# Patient Record
Sex: Female | Born: 1990 | Race: Black or African American | Hispanic: No | Marital: Single | Smoking: Current every day smoker
Health system: Southern US, Community
[De-identification: ages and names within clinical notes are randomized; demographics above are authoritative.]

## PROBLEM LIST (undated history)

## (undated) ENCOUNTER — Emergency Department (HOSPITAL_COMMUNITY): Admission: EM | Payer: 59 | Source: Home / Self Care

## (undated) ENCOUNTER — Ambulatory Visit (HOSPITAL_COMMUNITY): Admission: EM | Payer: Medicaid Other | Source: Home / Self Care

## (undated) ENCOUNTER — Inpatient Hospital Stay (HOSPITAL_COMMUNITY): Payer: Self-pay

## (undated) ENCOUNTER — Emergency Department (HOSPITAL_COMMUNITY): Discharge status: Absent without leave | Payer: Self-pay

## (undated) VITALS — BP 110/74 | HR 95 | Temp 98.1°F | Resp 17

## (undated) VITALS — BP 114/72 | HR 110 | Temp 98.5°F | Resp 20 | Ht 67.0 in | Wt 258.0 lb

## (undated) DIAGNOSIS — F99 Mental disorder, not otherwise specified: Secondary | ICD-10-CM

## (undated) DIAGNOSIS — R4189 Other symptoms and signs involving cognitive functions and awareness: Secondary | ICD-10-CM

## (undated) DIAGNOSIS — F419 Anxiety disorder, unspecified: Secondary | ICD-10-CM

## (undated) DIAGNOSIS — M1990 Unspecified osteoarthritis, unspecified site: Secondary | ICD-10-CM

## (undated) DIAGNOSIS — E119 Type 2 diabetes mellitus without complications: Secondary | ICD-10-CM

## (undated) DIAGNOSIS — E669 Obesity, unspecified: Secondary | ICD-10-CM

## (undated) DIAGNOSIS — F319 Bipolar disorder, unspecified: Secondary | ICD-10-CM

## (undated) DIAGNOSIS — I10 Essential (primary) hypertension: Secondary | ICD-10-CM

## (undated) DIAGNOSIS — F32A Depression, unspecified: Secondary | ICD-10-CM

## (undated) DIAGNOSIS — F329 Major depressive disorder, single episode, unspecified: Secondary | ICD-10-CM

## (undated) HISTORY — DX: Depression, unspecified: F32.A

## (undated) HISTORY — PX: TONSILLECTOMY: SUR1361

## (undated) HISTORY — DX: Major depressive disorder, single episode, unspecified: F32.9

---
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
## 2008-01-02 ENCOUNTER — Emergency Department (HOSPITAL_COMMUNITY): Admission: EM | Admit: 2008-01-02 | Discharge: 2008-01-02 | Payer: Self-pay | Admitting: Emergency Medicine

## 2008-03-30 ENCOUNTER — Emergency Department (HOSPITAL_COMMUNITY): Admission: EM | Admit: 2008-03-30 | Discharge: 2008-03-30 | Payer: Self-pay | Admitting: Emergency Medicine

## 2008-06-14 ENCOUNTER — Ambulatory Visit (HOSPITAL_COMMUNITY): Admission: RE | Admit: 2008-06-14 | Discharge: 2008-06-14 | Payer: Self-pay | Admitting: Obstetrics & Gynecology

## 2008-06-21 ENCOUNTER — Ambulatory Visit (HOSPITAL_COMMUNITY): Admission: RE | Admit: 2008-06-21 | Discharge: 2008-06-21 | Payer: Self-pay | Admitting: Family Medicine

## 2008-07-05 ENCOUNTER — Ambulatory Visit (HOSPITAL_COMMUNITY): Admission: RE | Admit: 2008-07-05 | Discharge: 2008-07-05 | Payer: Self-pay | Admitting: Family Medicine

## 2008-08-30 ENCOUNTER — Ambulatory Visit (HOSPITAL_COMMUNITY): Admission: RE | Admit: 2008-08-30 | Discharge: 2008-08-30 | Payer: Self-pay | Admitting: Family Medicine

## 2008-10-16 ENCOUNTER — Ambulatory Visit (HOSPITAL_COMMUNITY): Admission: RE | Admit: 2008-10-16 | Discharge: 2008-10-16 | Payer: Self-pay | Admitting: Obstetrics & Gynecology

## 2008-11-16 ENCOUNTER — Inpatient Hospital Stay (HOSPITAL_COMMUNITY): Admission: RE | Admit: 2008-11-16 | Discharge: 2008-11-19 | Payer: Self-pay | Admitting: Obstetrics & Gynecology

## 2008-11-16 ENCOUNTER — Ambulatory Visit: Payer: Self-pay | Admitting: Obstetrics & Gynecology

## 2009-04-22 ENCOUNTER — Emergency Department (HOSPITAL_COMMUNITY): Admission: EM | Admit: 2009-04-22 | Discharge: 2009-04-23 | Payer: Self-pay | Admitting: Emergency Medicine

## 2009-06-12 ENCOUNTER — Ambulatory Visit: Payer: Self-pay | Admitting: Obstetrics and Gynecology

## 2009-06-13 ENCOUNTER — Encounter: Payer: Self-pay | Admitting: Obstetrics and Gynecology

## 2009-06-13 LAB — CONVERTED CEMR LAB
Trich, Wet Prep: NONE SEEN
Yeast Wet Prep HPF POC: NONE SEEN

## 2010-07-28 LAB — URINALYSIS, ROUTINE W REFLEX MICROSCOPIC
Glucose, UA: NEGATIVE mg/dL
Hgb urine dipstick: NEGATIVE
Ketones, ur: NEGATIVE mg/dL
Protein, ur: NEGATIVE mg/dL

## 2010-07-28 LAB — WET PREP, GENITAL
Trich, Wet Prep: NONE SEEN
WBC, Wet Prep HPF POC: NONE SEEN
Yeast Wet Prep HPF POC: NONE SEEN

## 2010-08-03 LAB — TYPE AND SCREEN
ABO/RH(D): A POS
Antibody Screen: NEGATIVE

## 2010-08-03 LAB — CBC
Platelets: 135 10*3/uL — ABNORMAL LOW (ref 150–400)
Platelets: 158 10*3/uL (ref 150–400)
RDW: 14.5 % (ref 11.5–15.5)
WBC: 11.7 10*3/uL — ABNORMAL HIGH (ref 4.0–10.5)

## 2010-08-03 LAB — RPR: RPR Ser Ql: NONREACTIVE

## 2010-08-06 ENCOUNTER — Emergency Department (HOSPITAL_COMMUNITY)
Admission: EM | Admit: 2010-08-06 | Discharge: 2010-08-07 | Disposition: A | Discharge status: Other Acute Inpatient Hospital | Payer: Medicaid Other | Source: Home / Self Care | Attending: Emergency Medicine | Admitting: Emergency Medicine

## 2010-08-06 LAB — BASIC METABOLIC PANEL
BUN: 13 mg/dL (ref 6–23)
CO2: 28 mEq/L (ref 19–32)
Chloride: 105 mEq/L (ref 96–112)
Glucose, Bld: 89 mg/dL (ref 70–99)
Potassium: 3.4 mEq/L — ABNORMAL LOW (ref 3.5–5.1)
Sodium: 137 mEq/L (ref 135–145)

## 2010-08-06 LAB — URINALYSIS, ROUTINE W REFLEX MICROSCOPIC
Bilirubin Urine: NEGATIVE
Glucose, UA: NEGATIVE mg/dL
Ketones, ur: NEGATIVE mg/dL
Protein, ur: NEGATIVE mg/dL
pH: 7.5 (ref 5.0–8.0)

## 2010-08-06 LAB — DIFFERENTIAL
Basophils Absolute: 0 10*3/uL (ref 0.0–0.1)
Eosinophils Absolute: 0.3 10*3/uL (ref 0.0–0.7)
Eosinophils Relative: 2 % (ref 0–5)
Neutro Abs: 9.9 10*3/uL — ABNORMAL HIGH (ref 1.7–7.7)

## 2010-08-06 LAB — LITHIUM LEVEL
Lithium Lvl: 1.19 mEq/L (ref 0.80–1.40)
Lithium Lvl: 0.96 mEq/L (ref 0.80–1.40)

## 2010-08-06 LAB — CBC
HCT: 39.3 % (ref 36.0–46.0)
MCV: 80.5 fL (ref 78.0–100.0)
Platelets: 244 10*3/uL (ref 150–400)
RBC: 4.88 MIL/uL (ref 3.87–5.11)
RDW: 13.8 % (ref 11.5–15.5)
WBC: 12.5 10*3/uL — ABNORMAL HIGH (ref 4.0–10.5)

## 2010-08-06 LAB — RAPID URINE DRUG SCREEN, HOSP PERFORMED
Benzodiazepines: NOT DETECTED
Cocaine: NOT DETECTED
Opiates: NOT DETECTED

## 2010-08-06 LAB — SALICYLATE LEVEL: Salicylate Lvl: <4 mg/dL (ref 2.8–20.0)

## 2010-08-07 ENCOUNTER — Inpatient Hospital Stay (HOSPITAL_COMMUNITY)
Admission: RE | Admit: 2010-08-07 | Discharge: 2010-08-08 | DRG: 885 | Disposition: A | Discharge status: Home / Self Care | Payer: Medicaid Other | Source: Intra-hospital | Attending: Psychiatry | Admitting: Psychiatry

## 2010-08-07 DIAGNOSIS — T6592XA Toxic effect of unspecified substance, intentional self-harm, initial encounter: Secondary | ICD-10-CM

## 2010-08-07 DIAGNOSIS — Z880 Allergy status to penicillin: Secondary | ICD-10-CM

## 2010-08-07 DIAGNOSIS — T43502A Poisoning by unspecified antipsychotics and neuroleptics, intentional self-harm, initial encounter: Secondary | ICD-10-CM

## 2010-08-07 DIAGNOSIS — Z560 Unemployment, unspecified: Secondary | ICD-10-CM

## 2010-08-07 DIAGNOSIS — F3289 Other specified depressive episodes: Secondary | ICD-10-CM

## 2010-08-07 DIAGNOSIS — F329 Major depressive disorder, single episode, unspecified: Secondary | ICD-10-CM

## 2010-08-07 DIAGNOSIS — T43294A Poisoning by other antidepressants, undetermined, initial encounter: Secondary | ICD-10-CM

## 2010-08-07 DIAGNOSIS — F3189 Other bipolar disorder: Principal | ICD-10-CM

## 2010-09-09 NOTE — Discharge Summary (Signed)
NAMEKOREA, SEVERS                ACCOUNT NO.:  [id number]   MEDICAL RECORD NO.:  [id number]          PATIENT TYPE:  INP   LOCATION:  9142                          FACILITY:  [facility]   PHYSICIAN:  Lesly Dukes, M.D. DATE OF BIRTH:  1991/03/31   DATE OF ADMISSION:  11/13/2006  DATE OF DISCHARGE:  11/16/2006                               DISCHARGE SUMMARY   ADMISSION DIAGNOSIS:  Onset of labor.   DISCHARGE DIAGNOSIS:  Primary low transverse cesarean section with  delivery of viable baby girl.   DISCHARGE MEDICATIONS:  1. Motrin 600 mg 1 p.o. q.6h. p.r.n. cramping.  2. Percocet 5/325 mg 1-2 p.o. q.4-6h. p.r.n. pain.  3. Prenatal vitamin 1 p.o. daily.  4. MiraLax 17 gm packet 1 packet by mouth daily p.r.n. constipation.  5. Colace 100 mg 1 p.o. b.i.d. p.r.n. constipation.   HOSPITAL COURSE:  Patient is a 19 year old G1 admitted at 40 weeks and 3  days gestation with onset of labor, expected to get vaginal delivery  converted to primary low transverse cesarean section for second stage  arrest of descent and maternal exhaustion.  Cesarean section performed  by Dr. Mia Creek with no complications noted.  Estimated blood loss 750  cc under epidural anesthesia.  During cesarean, a viable female was  delivered with Apgars of 9 at 1 minute and 9 at 5 minutes, weighing 8  pounds, 2 ounces.  Three-vessel cord placenta removed.  No nuchal cords  were noted.  One day after operation, the patient had a fever of 101.5,  so she was started on gentamicin and clindamycin for suspected  endometritis.  Afterwards, the patient returned to baseline temperature  with no fever for the next several days.   The patient states that she will be bottle-feeding and will be using  condoms as birth control.   LABORATORY DATA:  Postoperative hemoglobin 9.1, postop hematocrit 27.4  on July 20th patient's information.  Blood type A+.  Antibody negative.  RPR negative.  Rubella immune.  Hepatitis B surface antigen  negative.  GBS negative.  HIV negative.  Blood cultures x2, no growth to date.   DISCHARGE INFORMATION:  Patient to be discharged home on July 22nd.   ACTIVITY:  No heavy lifting or sex for six weeks.   DIET:  Routine medications, see above.   STATUS:  Well.   INSTRUCTIONS:  Staples to be removed by Baby Love on postop day #7.  Discharged to home.  Patient is to follow up in six weeks at [facility].      Eustaquio Boyden, MD      Lesly Dukes, M.D.  Electronically Signed    JG/MEDQ  D:  11/16/2006  T:  11/17/2006  Job:  [id number]

## 2010-09-09 NOTE — Op Note (Signed)
[name], [name]                ACCOUNT NO.:  [id number]   MEDICAL RECORD NO.:  [id number]          PATIENT TYPE:  INP   LOCATION:  9142                          [facility]  [facility]   PHYSICIAN:  Ginger Carne, MD  DATE OF BIRTH:  04-04-1991   DATE OF PROCEDURE:  11/13/2006  DATE OF DISCHARGE:                               OPERATIVE REPORT   PREOPERATIVE DIAGNOSES:  1. Second stage arrest of labor.  2. Maternal exhaustion.   POSTOPERATIVE DIAGNOSES:  1. Second stage arrest of labor.  2. Maternal exhaustion.  3. Term viable delivery of female infant.   PROCEDURE:  Primary low transverse cesarean section.   SURGEON:  Ginger Carne, MD   ASSISTANT:  None.   COMPLICATIONS:  None immediate.   ESTIMATED BLOOD LOSS:  750 mL.   ANESTHESIA:  Epidural.   SPECIMEN:  Cord bloods.   OPERATIVE FINDINGS:  Term infant female delivered in the posterior  deflexed vertex presentation.  Apgar and weight per delivery room  record.  No gross abnormalities, and baby cried spontaneously at  delivery.  Amniotic fluid was clear, non foul-smelling.  Three-vessel  cord, central insertion, complete placenta.  Uterus, tubes and ovaries  showed normal decidual changes of pregnancy.   OPERATIVE PROCEDURE:  The patient prepped and draped in the usual  fashion and placed in left lateral supine position.  Betadine solution  used for antiseptic and the patient was catheterized prior to the  procedure.  After adequate epidural analgesia, a Pfannenstiel incision  was made and the abdomen opened.  Bladder flap incised transversely,  developed, and the lower uterine segment incised transversely.  Baby  delivered, cord clamped and cut and infant given to the pediatric staff  after bulb suctioning.  Placenta removed manually.  Uterus inspected.  Closure of the uterine musculature in one layer of 0 Vicryl running  interlocking suture.  Bleeding points hemostatically  checked.  Blood clots removed.   Closure of the fascia in one layer of 0  Vicryl running suture and skin staples for the skin.  Instrument and  sponge count were correct.  The patient tolerated the procedure well and  returned to the post anesthesia recovery room in excellent condition.      Ginger Carne, MD  Electronically Signed     SHB/MEDQ  D:  11/14/2006  T:  11/14/2006  Job:  [id number]

## 2010-09-09 NOTE — Discharge Summary (Signed)
[name], [name]                ACCOUNT NO.:  [id number]   MEDICAL RECORD NO.:  [id number]          PATIENT TYPE:  INP   LOCATION:  9131                          [facility]   PHYSICIAN:  Scheryl Darter, MD       DATE OF BIRTH:  1991/03/26   DATE OF ADMISSION:  11/16/2008  DATE OF DISCHARGE:  11/19/2008                               DISCHARGE SUMMARY   This is Dr. Denyse Amass dictating discharge summary for attending Ar. Debroah Loop.   Admit Date: 11/16/08  D/C 11/19/08   DISCHARGE DIAGNOSIS:  Repeat low-transverse cesarean section for a  viable 7 pounds 1 ounce female with an Apgar score of 8 and 9.   DISCHARGE MEDICATIONS:  1. Her usual regimen of lithium 300 mg b.i.d.  2. Lexapro 10 mg p.o. daily.  3. Ibuprofen 600 mg p.r.n.  4. Percocet.  5. Iron pill.   PROCEDURE:  Repeat left low-transverse cesarean section on November 17, 2008. Please see note for further details.   ALLERGIES:  The patient is allergic to PENICILLIN, which gives her rash.   The patient has a significant history of depression, which was treated  with lithium and Lexapro.   Hospital Course  20yo G2P2 s/p RLTCS EBL .  APGARs 8/9.  C/S closed with sutures.  Had a previous C-section in 2008 for failure to progress.  Her post opp  course was uncomplicated.  The patient will be using oral contraceptives  and will be feeding with bottle for her baby.   IMPORTANT LAB VALUES:  Hematocrit was 24.2 and hemoglobin was 8.3 on  November 17, 2008.  The patient's blood type is A positive.  She is rubella  immune.  HBsAg, which is hepatitis status, is negative.  HIV is  nonreactive.    Followup appointment with [facility] in 6 weeks.  The patient is  discharged home in stable condition.Clementeen Graham, MD      Scheryl Darter, MD  Electronically Signed    EC/MEDQ  D:  11/19/2008  T:  11/20/2008  Job:  [id number]   cc:   Clementeen Graham, MD

## 2010-09-09 NOTE — Letter (Signed)
June 21, 2008    [facility]   RE:   TERISHA, LOSASSO  MR#   [id number]  ACC#        [id number]   MFM CONSULTATION REPORT   Dear Doctors,   Thank you for referring your patient, [name], for maternal fetal  medicine consultation.  As you are aware, [name] is a 20 year old  gravida 2, para 1 at [redacted] weeks gestation based on an ultrasound done at  [redacted] weeks gestation.  As you are also aware, [name] has a history of  depression and bipolar disorder which is currently being treated with  the Lexapro mg daily and Lithium 300 mg b.i.d.   [name] was feeling well today and had no specific complaints.  She  denied any vaginal bleeding, loss of fluid per vagina or uterine  cramping.  She stated pregnancy has been uneventful up to this point,  and that currently, her mood was stable.   [name] has a long history of depression.  She reports onset of this at  approximately age 70, and her records indicate that she additionally has  bipolar disorder.  [name] denied personally knowing that she had a  diagnosis of bipolar disorder.  However, she states that she has been  treated with Lithium in the past and discontinued this on her own  approximately one year ago.  In the interim, she became pregnant and  began to develop significant symptoms of depression including suicidal  ideation.  She was seen by a psychiatrist, the name of which she cannot  remember at the present time, and was started on Lithium and Lexapro  approximately one and a half weeks ago.  She states that initiation  these medications has improved her mood significantly with no current  symptoms of depression or bipolar disorder at the present time.   [name] denies any other significant past medical history.  She is on  no other medications other than prenatal vitamins.   [name] obstetrical history includes one prior term delivery by  primary cesarean section due to failure to progress.   [name] reports  that her labor progressed spontaneously until she became completely  dilated and she believes she pushed for one or two hours, and then was  told that the baby was not descending.  The patient subsequently  underwent a cesarean delivery without complication and delivered an 8  pounds 2 ounces female infant that is currently alive and well.   [name] denies any history of abnormal Pap smears or cervical or  uterine procedures.  She has no other surgical history and denies a  family history of genetic diseases or congenital anomalies.  [name] is  allergic to penicillin which causes hives.  She does not smoke, use  alcohol or street drugs.   Review of systems today was negative.   [name] appeared in no acute distress today.  Her blood pressure was  106/61, her pulse was 80 beats per minute, weight was 186 pounds.  Ms.  [name] underwent an ultrasound in the radiology department at [facility] on June 14, 2008.  At that time, she was  noted to have a singleton fetus with normal amniotic fluid volume.  The  estimated gestational age varied significantly from her unsure LMT, and  her pregnancy was dated by that ultrasound giving her an EDD of November 22, 2008.  The fetal anatomic survey was limited,  but within normal limits  with no anomalies identified.   The implications of the use of Lexapro and Lithium during pregnancy were  discussed with [name] at length.  Fortunately, [name] started these  medications just one and half weeks ago which would be a time frame  outside the period of organogenesis for her fetus.  Thus, [name] did  not have any risks for congenital anomalies related to in-uterine  exposure to these medications.  The primary fetal effect of Lithium is  for congenital cardiac disease.  However, as [name]'s fetus did not  have an exposure to Lithium during the development of the fetal heart,  this risk is not present for  her.  Additionally, the literature is mixed  regarding the exact contribution, if any, of Lexapro to the development  of congenital anomalies.  [name] was reassured today that her  exposures to these medicines would not increased risk above her baseline  risk for her fetus to have a congenital anomaly.   However, it was emphasized that the combination of Lexapro and Lithium  exposure during pregnancy has only limited data in the literature  regarding this, and there may certainly be unknown risks of this  combination of medicines that we are unaware of at this point in time.  Further medications in the SSRI class have been shown to be associated  with some issues with neonatal period related to initial adaptation to  an ex-utero environment.  These would include jitteriness and  irritability as well as some issues with feeding.  These, including the  possibility of pulmonary hypertension in the neonate, have mostly been  attributed to other SSRIs, in particular Paxil.  However, large series  have included SSRI medications as a whole with Lexapro included have  also shown these results.  These were discussed with [name], and she  did express significant concern regarding the possibility of neonatal  adaptation difficulties for her fetus.  She asked many times if she  should discontinue her Lexapro.   It was emphasized to [name] the need to carefully weigh the risks and  benefits of any medications during pregnancy and use the fewest number  of medications at the lowest dose.  The minimal risk of Lexapro combined  with the significant benefit that she received this medication is clear,  and I was recommended that she remain on this medication for the present  time.  Should she decide to discontinue her Lexapro, it was advised that  she discussed this with her psychiatrist.  She reports regular follow-up  with a psychiatrist for the next appointment in approximately 3 weeks.  We have  scheduled [name] back in our office in 3 weeks for follow-up  ultrasound to re-evaluate fetal anatomy.  We have advised her to  continue close care with her psychiatrist and given careful precautions.  Additionally, Ms. Fouty has a previous cesarean section.  She is  interested in [facility] for this pregnancy.  Would recommend review of her  prior operative note and counseling regarding this based on those  findings as well as clinical circumstances at the time of delivery.  Thank you for allowing [place] to participate in the care of Ms. Georgi.  Please free to contact us at any time regarding this or any patient you  may have.   Sincerely,      Heather L. Rachel Bo, MD  Maternal Fetal Medicine  [facility]  HLM/MEDQ  D:  06/21/2008  T:  06/21/2008  Job:  [id number]

## 2010-09-09 NOTE — Op Note (Signed)
[name], [name]                ACCOUNT NO.:  [id number]   MEDICAL RECORD NO.:  [id number]          PATIENT TYPE:  INP   LOCATION:  9131                          [facility]   PHYSICIAN:  Scheryl Darter, MD       DATE OF BIRTH:  1990-09-01   DATE OF PROCEDURE:  11/16/2008  DATE OF DISCHARGE:                               OPERATIVE REPORT   PROCEDURE:  Repeat low-transverse cesarean section with lysis of  adhesions.   PREOPERATIVE DIAGNOSES:  1. Intrauterine pregnancy at 39 weeks 1 day gestation.  2. Previous cesarean section, desires repeat.   POSTOPERATIVE DIAGNOSES:  1. Intrauterine pregnancy, delivered.  2. Pelvic adhesions.   SURGEON:  Scheryl Darter, MD   ANESTHESIA:  Spinal.   ESTIMATED BLOOD LOSS:  900 mL.   COMPLICATION:  Pelvic adhesions.   COUNTS:  Correct.   DRAINS:  Foley catheter.   The patient gave written consent for repeat cesarean section at 39 weeks  1 day gestation.  She had one previous cesarean section.  The patient  identification was confirmed.  She was brought to the operating room and  spinal anesthesia was induced.  She was placed in dorsal supine position  with left lateral tilt.  Abdomen and perineum was sterilely prepped and  draped and Foley catheter was placed.  A #10 blade was used to make a  Pfannenstiel incision at the site of her previous cesarean section scar.  The incision was carried down to fascia and the fascia was incised and  incision was extended transversely.  Hemostasis was obtained with  cautery.  Fascia was separated from underlying scar and other tissue  attachments with blunt sharp dissection.  The rectus muscles were  densely adherent to the midline.  During the dissection of the fascia, a  small window in the peritoneal cavity was formed.  Metzenbaum scissors  were used to examine the peritoneal opening and separated the rectus  muscles which were adhered to the peritoneum.  There were dense  adhesions of peritoneum  and bladder to the anterior uterine wall.  These  were cross-clamped, cut and suture ligated with 0 Vicryl.  Once a  sufficient clear window at the lower uterine segment was obtained, there  was some bleeding at the site and the uterine cavity was entered bluntly  and incision was extended transversely with blunt traction.  Bag of  waters was ruptured with clear fluid.  Fetal head was delivered, and  mouth and nose were cleared with bulb suction.  Infant was delivered  atraumatically and the infant was vigorous at birth.  It was live born  female at 9:55, Apgars 8 and 9, weight was 7 pounds 1 ounce.  Cord was  clamped, cut and infant was handed to nursery personnel in attendance.  Placenta was delivered and uterine cavity was explored.  The patient  received IV Pitocin.  She had already received IV Kefzol.  Allis clamps  were placed along the uterine incision and 0 Vicryl suture was used to  close the uterine incision with a running locking suture.  Imbricating  layer followed with 0 Vicryl.  Hemostasis was obtained at the edge of  the incision with interrupted sutures with 0 Vicryl and good hemostasis  was seen.  There were still dense adhesions of the pelvis that were  inspected and hemostasis in the pelvis was assured.  Pelvis was  irrigated.  In order to assure integrity of the bladder, a solution of  saline and sterile milk was instilled into the bladder up to a volume of  300 mL and there was no apparent leakage of the solution.  This was then  drained.  Anterior peritoneum was closed with a running suture of 2-0  Vicryl.  Fascia was closed with running suture of 0 Vicryl.  Good  hemostasis was assured and the skin was irrigated.  Skin was closed with  a running subcuticular suture with 4-0 Vicryl.  Both ovaries and tubes  had been inspected and were normal.  The patient tolerated the procedure  well and she was brought in stable condition to recovery room.      Scheryl Darter,  MD  Electronically Signed     JA/MEDQ  D:  11/16/2008  T:  11/17/2008  Job:  [id number]

## 2010-09-10 NOTE — H&P (Signed)
NAMECICILIA, [name]                ACCOUNT NO.:  [id number]  MEDICAL RECORD NO.:  [id number]           PATIENT TYPE:  I  LOCATION:  0303                          [facility]  PHYSICIAN:  Landry Corporal, N.P.    DATE OF BIRTH:  1990/05/10  DATE OF ADMISSION:  08/07/2010 DATE OF DISCHARGE:                      PSYCHIATRIC ADMISSION ASSESSMENT   PATIENT IDENTIFICATION:  A 20 year old female involuntarily petitioned on August 07, 2010.  HISTORY OF PRESENT ILLNESS:  The patient is here on papers that state the patient is here on complaint of overdose on  3 lithium and 7 Lexapro.  History of depression and is verbalizing suicidal thoughts with a plan.  The patient does report that she took approximately 3 lithium tablets and 7 Lexapro tablets.  They were her own medications. She told her sister and was taken to the emergency department for further assessment.  She states that she overdosed with "wanting to die."  She states that her new boyfriend is "playing games" with her, bringing over women.  She states that he is possibly jealous that she is still wanting to get back with her ex-boyfriend.  She denies any voices. She reports some initial homicidal ideation, but denies any intent to harm him.  Denies any substance use.  She feels her medications have been working well for her.  PAST PSYCHIATRIC HISTORY:  The patient states she has been here, back in the child adolescent unit at the age of 87.  She goes to the [facility] for depression.  Has a history of overdosing approximately 2 years ago.  SOCIAL HISTORY:  The patient is single.  She has two children ages 19 and 1-1/2.  The children are with her mother and sisters.  The patient resides in [place].  She is unemployed.  She has a court date pending this Friday for shoplifting.  FAMILY HISTORY:  None.  ALCOHOL AND DRUG HISTORY:  Denies any alcohol or substance use.  Primary care provider is [facility].  MEDICAL PROBLEMS:  The patient considers herself healthy.  Denies any diabetes, hypertension, cardiac or asthma or seizures.  MEDICATIONS: 1. Lithium 300 mg b.i.d. 2. Lexapro 10 mg daily.  DRUG ALLERGIES:  Penicillin, reporting reaction of a rash.  PHYSICAL EXAMINATION:  This is a normally-developed young female.  She appears somewhat older than her calendar years.  She offers no complaints and she appears in no distress.  She has a normal EKG.  WBC count was mildly elevated at 12.5.  Urine drug screen is negative. Alcohol level less than 5.  Acetaminophen level less than 5.  Lithium level was at 1.19.  Poison Control was notified.  MENTAL STATUS EXAM:  The patient is fully alert and cooperative.  The patient is currently dressed in hospital gown.  She has fair eye contact.  Her speech is clear, normal pace and tone.  Mood is depressed, endorsing suicidal thoughts, but promises safety on the unit.  Does not appear to be responding to internal stimuli.  Her thought processes overall are coherent and goal directed.  She seems to have limited insight.  DISCHARGE DIAGNOSES:  AXIS  I:  Mood disorder NOS. AXIS II:  Deferred. AXIS III:  Status post polypharmacy overdose with lithium and Lexapro. AXIS IV:  Problems with primary support group, boyfriend and possibly other psychosocial problems and also check off legal. AXIS V:  Current is 30-35.  PLAN:  Our plan is to review her medications.  Will assess her safety issues.  Contact her mother for support.  Will have trazodone for sleep. The patient will be encouraged in the mood disorder groups.  Her tentative length of stay is 2-4 days.     Landry Corporal, N.P.     JO/MEDQ  D:  08/07/2010  T:  08/07/2010  Job:  [id number]  Electronically Signed by Limmie PatriciaP. on 08/07/2010 12:10:00 PM Electronically Signed by Geralyn Flash MD on 09/10/2010 02:00:43 PM

## 2010-09-12 NOTE — Discharge Summary (Signed)
NAMEISABELLAH, SOBOCINSKI                ACCOUNT NO.:  [id number]   MEDICAL RECORD NO.:  [id number]          PATIENT TYPE:  INP   LOCATION:  0104                          FACILITY:  [facility]   PHYSICIAN:  Lalla Brothers, MDDATE OF BIRTH:  13-Mar-1991   DATE OF ADMISSION:  11/22/2004  DATE OF DISCHARGE:  11/29/2004                                 DISCHARGE SUMMARY   IDENTIFICATION:  This [age]-year-old female ninth grade student at [employer] was readmitted emergently voluntarily in transfer from  [facility] emergency department for inpatient adolescent  psychiatric stabilization and treatment of suicide risk and depression. The  patient had been hospitalized November 13, 2004 through November 19, 2004 in the  behavioral health center for suicidality and depression and was treated with  Wellbutrin 150 milligrams XL every morning. The patient has cognitive and  possibly other learning deficits as does mother and learning and change are  challenging to accomplish for safety. The patient and had threatened to kill  herself by taking an overdose with mother interrupting after the patient  ingested 3 tablets. The patient was angry that mother would not give her  money for shopping. For full details please see the typed admission  assessment by Dr. Milford Cage.   SYNOPSIS OF PRESENT ILLNESS:  Apparently father resides at [facility] and  does pay child support. The patient resides with mother. The patient feels  taunted and traumatized by Leilani Able and possibly other peers at her  school though her examples such as Leilani Able telling the patient she could  not wear boots in the rain do not present likelihood of significant trauma  and distress. However, the patient tends to negatively over interpret the  comments or actions of others. She has atypical depressive features with  sleep preserved. The patient refuses to return to high school at [employer] though mother  feels the patient must. The patient is also stressed  by boyfriend cheating on her, and she has disappointment in teachers as  well. She reports not liking father since Christmas formulating that he  tells lies. Please see the typed admission assessment of Dr. Carolanne Grumbling  from November 13, 2004.   INITIAL MENTAL STATUS EXAM:  The patient was quite reserved with no  hypomanic features. She has psychomotor retardation with sad and constricted  affect. She had suicidal ideation but would currently contract for safety.  She had cognitive processing limitations. She had no definite  misperceptions. She had no history of seizure and in fact had normal EEG in  August 2005 and wake and sleeping stages. She had seen Tomasa Rand for  treatment in the summer of 2005. She feels she does not have any friends and  is suicidal.   LABORATORY FINDINGS:  Urine HCG was negative. Admission urinalysis like the  one during her previous hospitalization was a concentrated specimen with  specific gravity of 1.039 with small amount of bilirubin, ketones of 40 and  a protein of 100 mg per deciliter with microscopic having only 0-2 WBC and  rare bacteria. Repeat  urinalysis 3 days later was normal with specific  gravity of 1.027 on mid afternoon specimen. Urine drug screen was negative.  As mentioned above, a EEG in August 2005 was normal. During her last  hospitalization, the patient's total protein had been normal 6.3 but albumin  borderline low at 3.4 with lower limit of normal 3.5, otherwise laboratory  screens negative.   HOSPITAL COURSE AND TREATMENT:  General medical exam by Jorje Guild, PA was  otherwise negative. Height was 66 inches and weight was 147 pounds with  blood pressure on admission 133/91 with heart rate of 74 sitting and 134/80  with heart rate of 80 standing. On the day of discharge, supine blood  pressure was 121/65 with heart rate of 87 and standing blood pressure 116/64  with heart rate of  107. The patient initially wanted to go home. Various  treatment modalities had to be conducted significantly and interactive mode.  The patient has spontaneous and baseline mood did improve significantly  though gradually during the course of hospital stay. Her Wellbutrin was  titrated up to 300 and finally 450 milligrams XL every morning as clinical  monitoring carefully clarified that there were no suicide related side  effects from medication and that lack of efficacy for depressive symptoms  was the primary treatment target. The patient was observed to be  significantly oppositional at times including in the family therapy on the  day before discharge. At that time she threatened mother that she would just  ride the city bus around until school days were out and not attend school.  She swore at mother but did not become physically aggressive. However  afterward, she pledge to go to school as mother describes and to stop acting  out any more. She became more capable of talking appropriately about her  problems and excepting help in making changes on the day before discharge.  Generalization assessment undertaken in the final 24 hours of treatment.  Sleep was okay by the time of discharge and mood seen seems significantly  improved. She was socially more comfortable unless hypersensitive and over  reactive. Her suicidal ideation remitted and she wanted to live and intended  to stay alive by the time of discharge. She required no seclusion or  restraint or equivalent of such during hospital stay.   FINAL DIAGNOSES:  AXIS I:  Dysthymic disorder, early onset, severe with  atypical features. Oppositional defiant disorder.  Parent-child problem.  Other specified family circumstances. Other interpersonal problem.  AXIS II:  Mild mental retardation. Rule out other learning disorder not  otherwise specified (provisional diagnosis).  AXIS III:  Allergy to PENICILLIN. AXIS IV:  Stressors school  severe, acute and chronic; phase of life severe,  acute and chronic; family moderate acute and chronic.  AXIS V:  Global assessment of function on admission 25 with highest in last  year 50 and discharge global assessment of function was 47.   PLAN:  The patient did make significant improvement in mood and self-  regulation by the time of discharge. She remains cognitively limited but his  more capable of using cognitive skills she does possessed by the time of  discharge. She was much more interactively social even though she was crying  the day before discharge expecting release. She required no seclusion or  restraint during hospital stay. She was discharged on Wellbutrin 150  milligrams XL taking 3 tablets every morning, quantity #90 with one refill  prescribed, and they were educated on side  effects, risks and proper use.  She follows a regular diet has no restrictions on physical activity. Crisis  and safety plans are outlined if needed. She will see Dianah Field at  [facility] at 10:00 a.m. for therapy. She had a  medication management appoint with Dr. Clair Gulling November 24, 2004 at  3:15 p.m.  which she could not attend because she was in the inpatient unit again and  therefore, this will need to be rescheduled by mother.       GEJ/MEDQ  D:  11/29/2004  T:  11/30/2004  Job:  [id number]   cc:   Dianah Field  [facility]  [address]  [place], [place]   [place], MD  [facility]  [address]  [place], [place] [address]

## 2010-09-12 NOTE — Discharge Summary (Signed)
[name], [name]                ACCOUNT NO.:  [id number]   MEDICAL RECORD NO.:  [id number]          PATIENT TYPE:  INP   LOCATION:  6116                         FACILITY:  [facility]   PHYSICIAN:  Dyann Ruddle, MDDATE OF BIRTH:  Nov 24, 1990   DATE OF ADMISSION:  01/14/2005  DATE OF DISCHARGE:  01/19/2005                                 DISCHARGE SUMMARY   DISCHARGE DIAGNOSES:  1.  Suicide attempt with Seroquel and Cymbalta overdose.  2.  History of major depression.  3.  Dysthymic disorder.  4.  Mild mental retardation.  5.  Oppositional defiant disorder.   HOSPITAL COURSE:  This is a 20 year old female with a history of depression,  dysthymic disorder, oppositional defiant disorder, mild MR who presented  after suicide attempt with intentional ingestion of Seroquel (about twenty-  two 200 mg capsules) and Cymbalta (about twenty-two 30 mg capsules).  The  patient was upset that her normal bus driver did not take her home.  The  patient has had 4 previous psychiatric hospitalizations for suicide  attempts.   The patient was admitted. She had a gastric lavage and charcoal in the ED  and was intubated due to respiratory depression and minimal responsiveness.  The patient was weaned off mechanical ventilation by hospital day #3.  There  was evidence of a chemical pneumonitis that developed on chest x-ray;  however, this was in serial exams in the patient with her febrile and did  not have oxygen requirement after she was extubated.  On the day of  discharge, she was ambulating without difficulty and tolerating a regular  diet.   PROCEDURES:  EKG on September 20 did not show evidence of QT prolongation or  arrhythmias.   DISCHARGE INSTRUCTIONS AND FOLLOWUP:  This patient will be discharged to  [facility].      Altamese Cabal, M.D.    ______________________________  Dyann Ruddle, MD    KS/MEDQ  D:  01/19/2005  T:  01/19/2005   Job:  [phone]

## 2010-09-12 NOTE — Discharge Summary (Signed)
NAMECHERYLYN, SUNDBY                ACCOUNT NO.:  [id number]   MEDICAL RECORD NO.:  [id number]          PATIENT TYPE:  INP   LOCATION:  0100                          FACILITY:  [facility]   PHYSICIAN:  Carolanne Grumbling, M.D.    DATE OF BIRTH:  07/27/1990   DATE OF ADMISSION:  11/13/2004  DATE OF DISCHARGE:  11/20/2004                                 DISCHARGE SUMMARY   INTRODUCTION:  [name] was a 20 year old female.   INITIAL ASSESSMENT AND DIAGNOSIS:  Jaileigh was admitted after making threats  to kill herself by taking an overdose.  She was not clear at the time  exactly why she was feeling suicidal and threatening to take an overdose.  She seemed to feel that she did not get the things she wanted from her  mother, particularly clothes that she wanted to buy that her mother would  not buy for her.  Consequently, she says she has been depressed for a long  time.  She said she was mentally handicapped and people made fun of her.  She said her anger was the biggest problem, that her anger just happened and  she did not believe that there was anything she could do to control it.  When she got mad, she said she would throw things, break things, hit her  mother and she said she could not stop when she started.   MENTAL STATUS EXAM:  At the time of the initial evaluation revealed an  alert, oriented, young woman who was cooperative.  She said she was  mentally handicapped and she did seem to have cognitive limitations.  She  said she was having suicidal thoughts and was thinking of taking an overdose  of pills but that her mother had locked them up.  She was not exactly clear  as to why she was suicidal the day before.  She said she was overall unhappy  and had no friends and hated school.  She had temper tantrums for no reason  and she could not control them, she believed.  There was no evidence of any  thought disorder or other psychosis.  Short and long-term memory appeared to  be intact as best  could be measured with her limited cognitive abilities.  Judgment currently seemed adequate.  Insight was lacking.  Concentration was  adequate for a one-to-one interview.   ADMISSION DIAGNOSES:  AXIS I:  Depressive disorder not otherwise specified.  AXIS II:  Mental retardation, mild.  AXIS III:  Healthy.  AXIS IV:  Moderate.  AXIS V:  45/55.   FINDINGS:  All indicated laboratory examinations including thyroid function  test were within normal limits or noncontributory.   HOSPITAL COURSE:  While in the hospital, Mariaguadalupe did not show any insight.  She seemed even more limited as time went on.  She had difficulty  understanding even basic concepts.  For instance, when the family therapist  talked to her about earning money for chores, she seemed not to know what  chores were.  She stayed stuck on the fact that her mother should give her  things  and buy her clothes and, when her mother did not do that, her mother  was bad and she would have temper tantrums.  However, she did not make any  connection between her behavior and the conditions I just outlined.  From  her standpoint, temper tantrums just happened for no reason.  Even setting  simple goals in the hospital seemed to be beyond her basically.  It took  several days for her to even get the idea that writing a list of  possibilities for dealing with her anger better or for triggers for her  anger might have some value.  Even then, she did not see the value but she  came up with a list of ways to control her anger by be happy and my  mother buying me what I want.  Consequently, the overall stay, even though  it seemed to be useful in the sense of taking care of her and not stirring  up her issues and making things worse for her, there was no evidence of that  she processed anything and learned anything from her stay in the hospital.  In the final family session, she once again demonstrated how little she had  learned.  Her basic  plan was to behave in order to go home and stay out of  trouble.  Her idea of behaving, however, was one that did not change any of  the issues that she thought were her rights which is to get what she wants  when she wants it from her mother and not to be held accountable.  Nevertheless, because she was not making any threats towards killing herself  and said she wanted to go home and she was happy to be going home, she was  discharged.  It should be noted that, by this point, I think she has had one  further admission after this admission and also has called once again for a  readmission and each time she has picked up more slightly larger repertoire  of symptoms but basically it remains the same that she is misbehaving and  having temper outbursts.   FINAL DIAGNOSES:  AXIS I:  Depressive disorder not otherwise specified.  Oppositional defiant disorder.  AXIS II:  Mental retardation, mild to moderate.  AXIS III:  Healthy.  AXIS IV:  Moderate.  AXIS V:  50/55.   DISCHARGE MEDICATIONS:  At the time of discharge, she was taking Wellbutrin  XL 150 mg daily.   ACTIVITY/DIET:  There were no restrictions placed on her activity or her  diet.   FOLLOW UP:  She was to follow up at [facility] with an appointment  for the 31st of July and she was to see her therapist, Dianah Field, with  an appointment for August 15th.      Carolanne Grumbling, M.D.  Electronically Signed     GT/MEDQ  D:  12/09/2004  T:  12/09/2004  Job:  [id number]

## 2010-09-12 NOTE — H&P (Signed)
[name], [name]                ACCOUNT NO.:  [id number]   MEDICAL RECORD NO.:  [id number]          PATIENT TYPE:  INP   LOCATION:  0104                          FACILITY:  [facility]   PHYSICIAN:  Jasmine Pang, M.D. DATE OF BIRTH:  [birth year]   DATE OF ADMISSION:  11/22/2004  DATE OF DISCHARGE:                         PSYCHIATRIC ADMISSION ASSESSMENT   IDENTIFYING INFORMATION:  Patient is a 20 year old African-American female  from [place].  She was readmitted after being discharged from this unit 3  days ago.   HISTORY OF PRESENT ILLNESS:  The patient states she just got out of the  hospital 3 days ago, and her mother and she decompensated.  Mother brought  her back to the ER because she had gotten angry and agitated.  She was mad  because her mother would not give her money.  She wanted to go shopping and  buy clothes and other things.  She also states she threatened to kill  herself and was going to take an overdose.  She attempted to take her  medications sent home from the hospital and was able to take 3 pills, but  her mother intervened.   REVIEW OF SYSTEMS:  Dangerous to self.   PAST PSYCHIATRIC HISTORY:  Scheduled at the mental health center tomorrow  for therapy (this will have to be rescheduled).  She saw Tomasa Rand last  summer, was on some medicine in the past.  Hospitalized x 1 at [facility] just last week.  She was discharged on mood  stabilizers, but she does not know the names.  Mother will bring her bottles  in.  Substance abuse, none.  She denies any cigarette use.   PAST MEDICAL HISTORY:  Healthy.   ALLERGIES:  PENICILLIN.   CURRENT MEDICATIONS:  Mother to bring in medications so we can continue her  current regimen.   FAMILY AND SOCIAL HISTORY:  Patient lives with her mother and 2 younger  sisters.  She states she does not have any friends because they pick on her.  She has not seen her father since Christmas I don't like  him.  She says he  lies.  She is going into the 10th grade at [employer].  She denies any form of abuse, physical, sexual or emotional at home.  She  does get bullied at school by her report.   MENTAL STATUS EXAM:  Patient presented as a quiet, reserved African-American  female dressed casually.  She has intermittent eye contact.  Speech soft and  slow.  Positive psychomotor retardation.  Mood was sad and depressed.  Affect flat and constricted but smiled occasionally.  Positive suicidal  ideation but contracts for safety.  States she will talk to staff on the  unit should she feel suicidal.  No homicidal ideation.  No psychosis.  Thoughts logical and goal directed.  Cognitive not formally tested, but she  seemed to be functioning at below her age group.   PATIENT ASSETS AND STRENGTHS:  Patient is healthy.  Patient is engaging.   ADMISSION DIAGNOSES:  AXIS I:  Mood disorder not otherwise specified.  AXIS II:  Deferred.  AXIS III:  Healthy.  AXIS IV:  Severe (family conflict).  AXIS V:  Global assessment of function current is 25, global assessment of  function highest in past year 50.   ESTIMATED LENGTH OF INPATIENT TREATMENT:  5-7 days.   INITIAL DISCHARGE PLANS:  Will return home to live with mother and sisters  upon discharge.   INITIAL PLAN OF CARE:  Restart her medications that she was being treated  with prior to admission.  She will also be involved in unit therapeutic  groups and activities.  Patient will have a physical exam and complete  battery of labs.  Patient will begin family therapy.  We will begin  discharge planning as well to have this in place by the time she is  discharged.      Jasmine Pang, M.D.  Electronically Signed     BHS/MEDQ  D:  11/23/2004  T:  11/23/2004  Job:  [id number]

## 2010-09-12 NOTE — H&P (Signed)
[name], [name]                ACCOUNT NO.:  [id number]   MEDICAL RECORD NO.:  [id number]          PATIENT TYPE:  INP   LOCATION:  0100                          FACILITY:  [facility]   PHYSICIAN:  Carolanne Grumbling, M.D.    DATE OF BIRTH:  10/24/1990   DATE OF ADMISSION:  11/13/2004  DATE OF DISCHARGE:                         PSYCHIATRIC ADMISSION ASSESSMENT   CHIEF COMPLAINT:  [name] was admitted to the hospital after making threats  to kill herself by taking an overdose.   HISTORY LEADING UP TO THE PRESENT ILLNESS:  [name] is not clear as to what  exactly the precipitants were for the suicidal thoughts and threatening an  overdose yesterday.  She says she has been depressed for awhile.  She cannot  remember exactly how long.  She feels sad, down, seems like she has no  friends, that people do not like her, that things will not change in the  future.  She does not like herself.  She says she is mentally handicapped  and people make fun of her.  She said she did have the suicidal thoughts and  she was thinking of taking too many pills.  Her mother put all the pills  away so she cannot do that, but she still does not feel happy.  There was no  specific precipitating event that I could determine.  She also says she has  trouble with her anger.  She says it just happens by itself, that she cannot  control it, but she hopes that while she is here she can learn to control it  better.  When she gets mad, she says, she throws things, breaks things, she  hits her mother but not anybody else, and she says she cannot stop herself  when she starts.  She does have recall for the temper outbursts, however.   FAMILY, SCHOOL AND SOCIAL ISSUES:  She says she lives with her mother and  her 23- and 75-year-old sisters.  She gets along with her sisters okay.  She  basically likes her mother and gets along with her mother, but she does have  these temper tantrums and mom tries to stop and help with, but she  says so  far nothing helps.  She goes to [employer], says she hates it.  She  refuses to go back next year.  She does not care what her mother says, she  is not going to go back.  She says the kids there pick on her and she sits  in the special classroom and wastes her time.  She was at [employer] before and said she liked it much better.  Kids picked on her there  too, calling her slow and dumb, but she says she still liked school and it  did not bother her that much.  She denied any history of abuse, physically  or sexually.  She said she does have a father who lives in [place] while  she lives in [place].  He does pay child support, she thinks, but he does  not do anything special like  presents for birthdays or Christmas.  She does  not want to see him because she does not really like him, she says, and he  does not seem to care about her.  She said she used to have friends but for  some reason they changed and she does not have friends any more.   PREVIOUS PSYCHIATRIC TREATMENT:  None was reported.   ALCOHOL, DRUG AND LEGAL ISSUES:  She denied any use.   MEDICAL PROBLEMS, ALLERGIES, AND MEDICATIONS:  She reported no medical  problems.  She says she is allergic to PENICILLIN and she is currently  taking no medications.   MENTAL STATUS:  Mental status at the time of the initial evaluation revealed  an alert, oriented young woman who came to the interview willingly and was  cooperative.  She says she is mentally handicapped and she does seem to  have cognitive limitations.  She said that she was having suicidal thoughts  and was thinking of taking an overdose of pills, but her mom took all the  pills and locked them up.  She does not have another plan for killing  herself at this point.  She was very unclear as to exactly why she felt  suicidal the day before.  She said she has had the thoughts off and on.  She  did say overall that she is unhappy because she  has no friends and hates  school.  She also has temper tantrums, she says, for no apparent reason,  that she says she would like to change.  There was no evidence of any  thought disorder or other psychosis.  Short- and long-term memory appeared  intact as measured by her ability to recall recent and remote events in her  own life.  Her judgment currently seemed adequate.  Insight was minimal.  Intellectual functioning seems limited.  Concentration was adequate for a  one-to-one interview.   PATIENT ASSETS:  [name] says she wants help.   ADMITTING DIAGNOSES:  AXIS I:  Depressive disorder not otherwise specified.  AXIS II:  Mental retardation, mild.  AXIS III:  Healthy.  AXIS IV:  Moderate.  AXIS V:  45/55.   INITIAL PLAN OF CARE:  Estimated length of hospitalization is 5 to 7 days.  The plan is to stabilize to the point of having no suicidal ideation and  helping her learn coping skills for dealing with her anger more effectively  and appropriately.  Medications will likely be prescribed for her  depression.       GT/MEDQ  D:  11/14/2004  T:  11/14/2004  Job:  [id number]

## 2010-09-12 NOTE — H&P (Signed)
[name], [name]                ACCOUNT NO.:  [id number]   MEDICAL RECORD NO.:  [id number]          PATIENT TYPE:  INP   LOCATION:  0199                          FACILITY:  BH   PHYSICIAN:  Lalla Brothers, MDDATE OF BIRTH:  [birth year]   DATE OF ADMISSION:  12/31/2004  DATE OF DISCHARGE:                         PSYCHIATRIC ADMISSION ASSESSMENT   IDENTIFICATION:  This 16[age]47/20-year-old female, 10th grade student at [employer], is admitted emergently involuntarily on a [facility] petition for commitment in transfer from [facility]  Emergency Department for inpatient psychiatric stabilization and treatment  of suicide risk and depression. The patient presented to the emergency  department on the morning of admission, having overdosed the preceding  midnight with 10 Cymbalta 30 mg each and 4 Seroquel 200 mg each as a suicide  attempt after being angry and fighting with mother the afternoon of  December 30, 2004. The patient was throwing things and fighting with mother  and then overdosed and did not disclose the overdose until the following  morning when her vision was blurred and having misperceptions of rainbows  that both frightened and overanimated her. Mother was fearful the patient  would injure herself further as well as possibly having medical consequences  from overdose that could not be predicted. The patient had been discharged  from [facility] in [place] December 18, 2004, having been  admitted there after two consecutive hospitalizations at the [facility] between November 13, 2004 and November 29, 2004 failed to provide any  sustained stabilization. We determined from Dianah Field at [facility]  that case management is working on a placement though mother has been  resistant to placement including during the patient's previous [facility]  admissions. Dianah Field and [facility] recommended that the patient be  hospitalized locally instead of returning to [employer] [facility] as thus far no  particular hospital program is generalizing safety and stabilization to the  school or outpatient environment at home or community.   HISTORY OF PRESENT ILLNESS:  The patient is known to me from previous  hospitalization. She has mild mental retardation. Her capacity for academic  learning is limited though she does have capacity for social and behavioral  learning. The patient is fixated upon economics and shopping. She was angry  with mother on the day prior to admission, feeling that mother owed her  money but did not pay up. The patient therefore fought mother. The patient  was yelling in the emergency department that she did not want placement in a  hospital and would rather go to jail. The patient would yell and be angry  one minute and then act goofy and appropriate the next minute. The emergency  room assessment as well as the ACT Team assessment determined that the  patient was just too much risk for release from the emergency department to  a less restrictive environment. Emergency petition was therefore filed for  commitment and the patient is admitted to the [facility] at  the request of Dianah Field. The patient is currently in outpatient  treatment with Dianah Field at [facility] and was to see Dr. Mikey Bussing for  psychiatric follow-up. She does have a case Production designer, theatre/television/film, although the identity  of the case manager is not immediately recalled. She was hospitalized at the  [facility] November 13, 2004 through November 20, 2004 and again November 22, 2004 through November 29, 2004. The patient was concluded to have dysthymic  disorder and oppositional defiant disorder. She is significantly disruptive  at times. She was refusing to return to school this fall. She feels she is  picked on at school, particularly by a Burnett Kanaris. The patient has not  returned to school as she has not been out of the  hospital long enough  although the patient was predicting by the time of her last discharge that  she would go back to [place] instead of insisting upon placement at  another or alternative school. The patient has limited adaptive abilities  even though she does have some capacity for social and behavioral learning.  However, interactive therapies are necessary. The patient has a difficult  time benefiting from the full complement of services available in an acute  care hospital. However, repeated suicide attempts do require inpatient  stabilization. The patient was challenged multiple times in ways to  determine if she had actually overdosed. The patient maintained a consistent  report despite attempts at clarification and confirmation that she did  overdose with the reported amount. The patient concretely therefore  formulates that she will not have to take her medicines for 10 days. The  patient has longstanding dysthymic dysphoria, leaving her failing to access  support sources possible and failing to develop interest and involvement  that sustains objective activity. The patient resides with mother and two  younger sisters, ages 17 and 82. She sees father rarely in [facility]. She  may have been noncompliant with her medications according to mother. She  does not complain of any side effects from her medicines. She has had no  definite organic central nervous system trauma. She uses no alcohol or  illicit drugs. She does not establish definitely compulsive shopping or  spending though she is overdetermined in her focus on having shopping and  money to shop. She has had no definite hallucinations or delusions. She has  no definite hypomania and she does not spend excessively though she does  demand and become overdetermined in spending what she has. At the time of  admission, she is taking Cymbalta 30 mg every morning and Seroquel 200 mg every bedtime. Social work does determine  that there was more concern at  [facility] [facility] that the patient has definite mental illness although the patient  and mother have always been regressed at the [facility] in  terms of expecting someone else to get them better and then to sustain that  period, without the family having to adjust their routine or the patient  having to definitively change. Mother has been opposed to any kind of  placement for the patient to facilitate her learning. Her age-related  opportunity for such learning is therefore narrowing and she may have more  needs than mother can acknowledge or realize.   PAST MEDICAL HISTORY:  The patient has mild facial acne. She has abrasions  of the right wrist from where mother's fingernails dug into her as they were  fighting on the day before admission. Last menses was December 09, 2004 and  she denies sexual activity. She had chicken  pox as a child. She had a normal  EEG December 05, 2003. She is allergic to PENICILLIN, manifested by rash. She  has had no definite seizure or syncope that can be determined. She has no  heart murmur or arrhythmia.   REVIEW OF SYSTEMS:  The patient denies difficulty with gait, gaze or  continence. She denies exposure to communicable disease or toxins. She  denies rash, jaundice or purpura. There is no chest pain, palpitations or  presyncope. There is no abdominal pain, nausea, vomiting or diarrhea. There  is no dysuria or arthralgia.   IMMUNIZATIONS:  Up-to-date.   FAMILY HISTORY:  The patient resides with mother and two younger sisters,  ages 45 and 2. Father resides in [facility] [place] [facility] and has provided some child  support and the patient sees him rarely. They have not acknowledged definite  family history of major psychiatric disorder. However, mother has little  capacity or commitment to affecting change in the patient's behavior  longstanding. The patient has seen a Tomasa Rand for therapy in the summer  of 2005. The patient  feels she has no friends and was even feeling suicidal  then. The patient has been treated with Wellbutrin 150 mg XL in the past as  a single morning dose. She is now on the Cymbalta. She was on the Wellbutrin  at the time of her last [facility] discharge November 20, 2004.   SOCIAL AND DEVELOPMENTAL HISTORY:  The patient is a 10th grade student at  [employer]. She denies sexual activity. She uses no drugs  or alcohol. She enjoys shopping the most and is very conflictual with mother  about being able to use her entire monthly check for shopping.   ASSETS:  The patient likes shopping.   MENTAL STATUS EXAM:  Height is 66-1/2 inches and weight is 155 pounds, up  from 147 pounds in July of 2006. Blood pressure is 127/81 with heart rate of  103 (sitting) and 124/81 with heart rate of 110 (standing). The patient is  alert and oriented. Cranial nerves are intact. Speech is normal though she has a concrete interpersonal and communicative style with diminished  prosody. Alternating motion rates are intact. Muscle strengths and tone are  normal. There are no pathologic reflexes or soft neurologic findings. There  are no abnormal involuntary movements. Gait and gaze are intact. She does  have cognitive limitations adaptively clinically having mild mental  retardation. She is exhibiting moderate dysphoria at the time of admission  that has been severe at times with atypical depressive features. She does  not have definite hypomania but has been started on Seroquel at bedtime in  addition to Wellbutrin being changed to Cymbalta at [facility]. Still, she  has not deviated from her pattern of self-injury and in fact at this time  has been more self-injurious than at any time in the past such as at times  in the past threatening to jump into traffic but not actually harming  herself. The patient is now acting upon such. Still, it appears important to  sustain the treatment that is established for  the patient's mental health  problems rather than changing her treatment each time because the patient  acts out destructively. She has no hypomanic symptoms at this time. She has  no psychotic symptoms. She has no dissociation or post-traumatic stress  evident. She does report suicidal ideation and had a definite suicide  attempt, though she formulates it was mainly in response to  the emotional  decompensation of fighting with mother.   IMPRESSION:  AXIS I:  Dysthymic disorder, early onset, severe with atypical  features.  Oppositional defiant disorder.  Rule out bipolar disorder not  otherwise specified (provisional diagnosis).  Rule out post-traumatic stress  disorder (provisional diagnosis).  Parent-child problem.  Other specified  family circumstances.  Other interpersonal problem.  Noncompliance with  treatment.  AXIS II:  Mild mental retardation.  AXIS III:  Cymbalta and Seroquel overdose, abrasions, right wrist, allergy  to PENICILLIN.  AXIS IV:  Stressors:  Family--severe, acute and chronic; school--severe,  acute and chronic; phase of life--severe, acute and chronic. AXIS V:  GAF 40  with highest in last year 50.   PLAN:  The patient is admitted for inpatient adolescent psychiatric and  multidisciplinary multimodal behavioral health treatment in a team-based  program at a locked psychiatric unit. Psychosocial coordination with case  management and [facility] can be undertaken. Family therapy is planned as  possible though with a new format as previous family therapy has not been  helpful in mobilizing behavioral change in patient or mother. Out of home  placement may well be necessary. Will restart Seroquel on the second  hospital night at 200 mg nightly and Cymbalta 30 mg every morning the third  hospital morning. Anger management, social and communication skills,  cognitive behavioral and interactive psychotherapies, family therapy and psychosocial coordination with case  management are planned.   ESTIMATED LENGTH OF STAY:  Five to seven days with target symptoms for  discharge being stabilization of suicide risk and mood, stabilization of  dangerous, disruptive behavior and generalization of the capacity for safe,  effective participation in subsequent placement or outpatient treatment.      Lalla Brothers, MD  Electronically Signed     GEJ/MEDQ  D:  01/01/2005  T:  01/01/2005  Job:  [id number]

## 2010-09-12 NOTE — Procedures (Signed)
ELECTROENCEPHALOGRAPHY NUMBER:   HISTORY:  This patient is a 20 year old with a history of learning  disability.  The patient is being evaluated for the above problem.  Medications include Prozac.  These is a routine EEG.  No skull defects are  noted.   ELECTROENCEPHALOGRAPHY CLASSIFICATION:  Normal awake and asleep.   DESCRIPTION:  According to the background rhythm, this recording consists of  a somewhat low amplitude 11 Hz background rhythm activity that is reactive  to eye opening and closing.  As the record progresses, the patient seems to  enter the drowsy state with early stage 2 sleep with some rudimentary sleep  spindles seen.  Vertex sharp wave activity.  Towards the end of the  recording, photic stimulation is performed resulting in a bilateral, but  minimal photic driving response.  Hyperventilation is also performed  resulting in a very minimal background activity without significant slowing  seen.  At no time during the recording does there appear to be evidence of  actual spikes, spike wave discharges or evidence of focal slowing.  The EKG  monitor shows no evidence of cardiac rhythm abnormalities with a heart rate  of 72.   IMPRESSION:  This is an essentially normal electroencephalographic recording  in the awake and sleeping stages.  No evidence of ictal or interictal  discharges were seen.    Marlan Palau, M.D.   EAV:WUJW  D:  12/05/2003 18:49:45  T:  12/06/2003 12:45:46  Job #:  [id number]

## 2010-09-12 NOTE — Discharge Summary (Signed)
NAMESOLANGE, [name]                ACCOUNT NO.:  [id number]   MEDICAL RECORD NO.:  [id number]          PATIENT TYPE:  INP   LOCATION:  0199                          FACILITY:  BH   PHYSICIAN:  Lalla Brothers, MDDATE OF BIRTH:  01/29/1991   DATE OF ADMISSION:  12/31/2004  DATE OF DISCHARGE:  01/06/2005                                 DISCHARGE SUMMARY   IDENTIFICATION:  This [age]-year-old female, 10th grade student at [employer], was admitted emergently, involuntarily on a [facility] for Commitment in transfer from [facility]  Emergency Department for inpatient stabilization and treatment of suicide  risk and depression. The patient presented to the emergency department in  the early morning, after overdosing at midnight with 10 Cymbalta capsules of  30 mg each, and 4 Seroquel tablets of 200 mg each as a suicide attempt when  angry and fighting with mother the preceding afternoon, feeling that mother  owed her money. The patient has mild mental retardation and is fixated upon  her own money supply and shopping. Mother has been unwilling to disengage  her reinforcement of such fixations. The patient has become more depressed  over time and gradually acting more and more upon her dangerous impulses.  She had threatened to jump into traffic in the past and now has actually  overdosed. She has had two hospitalizations at the [facility]  in July and August of this year, and then the third hospitalization at [facility]. Lexapro medication for dysthymia was changed at [facility] to Cymbalta  and Seroquel. Outpatient therapist, Dianah Field at [facility], knows  that case management is underway, though mother has been unwilling to  consider any placement thus far, even though the patient needs interactive  and behavioral therapy that appears to be more sophisticated than mother is  willing to participate in, or possibly able.  For full details please see the  typed admission assessment.   SYNOPSIS OF PRESENT ILLNESS:  The patient does not manifest major depressive  or bipolar symptoms at the time of admission. She has no psychotic or  dissociative features. There is no anxiety or post-traumatic re-enactment.  She did accomplish reintegration to school and is socially integrated,  whereas during her previous hospitalizations, she maintained she could never  returned to [employer] again. She has not resolved family  conflicts, particularly over money and clothes. She has chronic cumulative  dissatisfaction and disappointment with herself, her life, and her future,  that shopping and money seem to temporarily compensate. She maintains that  mother owes her $300. Age-related learning opportunities are fleeting. The  patient is allergic to PENICILLIN. She had a normal EEG in August 2005. She  has abrasions on the right wrist from mother's fingernails. She did have  some therapy with Tomasa Rand in the summer of 2005 and is now being seen  in [facility] with Dianah Field.   INITIAL MENTAL STATUS EXAM:  The patient has adaptive and cognitive  limitations of mild mental retardation. She has moderate dysphoria at  the  time of admission with times of episodic impulsive regressive atypical  depression. She is hypersensitive to the comments or actions of others.  There is no hypomania or psychotic features, though the addition of Seroquel  at [facility] is otherwise unclear except for the lack of response thus far  to treatment and the patient's progressive acting upon impulse. There is no  anxiety and in fact too little anxiety. She reports suicidal ideation and  reiterates multiple times that she did have a suicide attempt.   LABORATORY FINDINGS:  CBC was normal except MCHC 34.8 with upper limit of  normal 34. White count was normal at 6600, hemoglobin 14, MCV of 83 and  platelet count 219,000.   In the emergency department, venous blood gas was  normal except slight respiratory alkalosis with pCO2 44.8 and pH 7.354.  Basic metabolic panel in the emergency department was normal with sodium  140, potassium 3.8, random glucose 96 and creatinine 0.9. Urine pregnancy  test was negative. Urine drug screen was negative, as were acetaminophen and  salicylate levels, and blood alcohol. Hepatic function panel at the [facility] was  normal with albumin 3.7, AST 18, ALT 18 and GGT 12. Free T4 was normal at  1.32 and TSH at 2.777. A repeat urine drug screen was negative with  creatinine of 215 mg/dL. Urinalysis was normal with specific gravity of  1.025. RPR was nonreactive and urine probe for gonorrhea and chlamydia  trachomatis by DNA amplification were both negative.   HOSPITAL COURSE AND TREATMENT:  General medical exam by Jorje Guild, PA-C  determined no other abnormalities other than the abrasion on the right wrist  and history of rash from penicillin. Admission height was 64 inches and  weight 134 pounds. Blood pressure on admission was 114/74 with heart rate of  62 sitting, and 114/79 with heart rate of 79 standing. Discharge blood  pressure was 113/71 with heart rate of 52 supine, and 121/76 with heart rate  of 65 standing. The patient maintained on admission that she had overdosed  on enough medicine that she did not need her medicine for a long time.  However, she did agree to resume the medication at a medically appropriate  time. Her Cymbalta was reintroduced, and then the Seroquel. She tolerated  the medications at the admission dose without difficulty. Behavioral and  family therapy and interactive formats with both the patient and mother  addressed disengaging from progressive destructive behaviors and triggering  demands toward reestablishing communication and containment that would allow  family relations and an acceptable teenage life. Safety was emphasized, though predominately from  the standpoint of expectation rather than option.  Following through by mother was addressed and mother is more willing to  apply the patient's monthly check to a behavioral therapy placement through  the case manager now than she has been in the past. The patient concluded,  by the time of discharge, that she did not want to have to be hospitalized  again that she is motivated to change short of needing the group home. She  concluded that she would accept return to school and that mother does not  have to give her the $300 she was demanding. The patient had some motivation  to get a part-time job and to begin to have a source of appropriately earned  money for such spending. She required no seclusion or restraint during  hospital stay. She participated at the level possible in the treatment  programming, though  predominantly interactive therapies were required.   FINAL DIAGNOSIS:  AXIS I:  1.  Dysthymic disorder, early onset, severe with atypical features.  2.  Oppositional defiant disorder.  3.  Parent child problem.  4.  Other specified family circumstances.  5.  Other interpersonal problem.  6.  Noncompliance with treatment.  AXIS II:  Mild mental retardation.  AXIS III:  1.  Cymbalta and Seroquel overdose.  2.  Abrasions, right wrist.  3.  Allergy to penicillin manifested by rash.  AXIS IV:  Stressors:  family severe, acute and chronic; school moderate  acute and chronic; phase of life severe, acute and chronic.  AXIS V:  GAF on admission 40 with highest in last year 50.   PLAN:  The patient was discharged to mother in improved condition free of  suicidal ideation. She follows a regular diet has no restrictions on  physical activity. Abrasions were healing well on the right wrist. Crisis  and safety plans are outlined if needed. She is prescribed Cymbalta 30-mg  capsule every morning, quantity #30 with no refill; as well as Seroquel 200-  mg tablet every bedtime, quantity #30  with no refill. She will see Dr. Guadalupe Maple 01/21/2005 at  1630 for psychiatric followup. She sees Richardean Canal 01/06/2005 at 100  a.m. for psychotherapy. Case management is with [facility]. Group home placement in a developmentally appropriate facility  for behavioral therapy and safety is being sought.      Lalla Brothers, MD  Electronically Signed     GEJ/MEDQ  D:  01/08/2005  T:  01/08/2005  Job:  [id number]   cc:   ` Dr. Guadalupe Maple and Dianah Field  [facility]  [address]  [place], [place] [address]   [facility]  [address]  [place], [place] [address]

## 2010-11-05 ENCOUNTER — Emergency Department (HOSPITAL_COMMUNITY)
Admission: EM | Admit: 2010-11-05 | Discharge: 2010-11-05 | Discharge status: Against Medical Advice | Payer: Medicaid Other | Attending: Emergency Medicine | Admitting: Emergency Medicine

## 2010-11-05 DIAGNOSIS — N760 Acute vaginitis: Secondary | ICD-10-CM | POA: Insufficient documentation

## 2010-11-05 DIAGNOSIS — Z79899 Other long term (current) drug therapy: Secondary | ICD-10-CM | POA: Insufficient documentation

## 2010-11-05 DIAGNOSIS — F3289 Other specified depressive episodes: Secondary | ICD-10-CM | POA: Insufficient documentation

## 2010-11-05 DIAGNOSIS — F329 Major depressive disorder, single episode, unspecified: Secondary | ICD-10-CM | POA: Insufficient documentation

## 2010-11-05 LAB — URINALYSIS, ROUTINE W REFLEX MICROSCOPIC
Glucose, UA: NEGATIVE mg/dL
Ketones, ur: NEGATIVE mg/dL
Protein, ur: NEGATIVE mg/dL
Urobilinogen, UA: 1 mg/dL (ref 0.0–1.0)

## 2010-11-06 LAB — GONOCOCCUS DNA, PCR: GC Probe Amp, Genital: NEGATIVE

## 2010-11-06 LAB — WET PREP, GENITAL

## 2010-11-06 LAB — POCT PREGNANCY, URINE: Preg Test, Ur: NEGATIVE

## 2010-11-06 LAB — GLUCOSE, CAPILLARY: Glucose-Capillary: 89 mg/dL (ref 70–99)

## 2010-12-20 ENCOUNTER — Inpatient Hospital Stay (HOSPITAL_COMMUNITY)
Admission: EM | Admit: 2010-12-20 | Discharge: 2010-12-22 | DRG: 885 | Disposition: A | Discharge status: Home / Self Care | Payer: Medicaid Other | Source: Other Acute Inpatient Hospital | Attending: Psychiatry | Admitting: Psychiatry

## 2010-12-20 DIAGNOSIS — Z79899 Other long term (current) drug therapy: Secondary | ICD-10-CM

## 2010-12-20 DIAGNOSIS — Z560 Unemployment, unspecified: Secondary | ICD-10-CM

## 2010-12-20 DIAGNOSIS — F39 Unspecified mood [affective] disorder: Principal | ICD-10-CM

## 2010-12-20 DIAGNOSIS — Z9119 Patient's noncompliance with other medical treatment and regimen: Secondary | ICD-10-CM

## 2010-12-20 DIAGNOSIS — Z91199 Patient's noncompliance with other medical treatment and regimen due to unspecified reason: Secondary | ICD-10-CM

## 2010-12-20 DIAGNOSIS — Z880 Allergy status to penicillin: Secondary | ICD-10-CM

## 2010-12-20 DIAGNOSIS — R45851 Suicidal ideations: Secondary | ICD-10-CM

## 2010-12-20 DIAGNOSIS — R6250 Unspecified lack of expected normal physiological development in childhood: Secondary | ICD-10-CM

## 2010-12-20 LAB — COMPREHENSIVE METABOLIC PANEL
ALT: 10 U/L (ref 0–35)
AST: 16 U/L (ref 0–37)
CO2: 28 mEq/L (ref 19–32)
Calcium: 9.9 mg/dL (ref 8.4–10.5)
Potassium: 3.7 mEq/L (ref 3.5–5.1)
Sodium: 137 mEq/L (ref 135–145)
Total Protein: 7.8 g/dL (ref 6.0–8.3)

## 2010-12-20 LAB — CBC
MCH: 27.1 pg (ref 26.0–34.0)
MCHC: 33.1 g/dL (ref 30.0–36.0)
Platelets: 222 10*3/uL (ref 150–400)
RBC: 4.43 MIL/uL (ref 3.87–5.11)

## 2010-12-21 LAB — PREGNANCY, URINE: Preg Test, Ur: NEGATIVE

## 2010-12-21 LAB — URINALYSIS, ROUTINE W REFLEX MICROSCOPIC
Glucose, UA: NEGATIVE mg/dL
Leukocytes, UA: NEGATIVE
pH: 6 (ref 5.0–8.0)

## 2010-12-21 LAB — DRUGS OF ABUSE SCREEN W/O ALC, ROUTINE URINE
Amphetamine Screen, Ur: NEGATIVE
Barbiturate Quant, Ur: NEGATIVE
Cocaine Metabolites: NEGATIVE
Creatinine,U: 172.9 mg/dL

## 2010-12-22 LAB — LITHIUM LEVEL: Lithium Lvl: <0.25 mEq/L — ABNORMAL LOW (ref 0.80–1.40)

## 2010-12-23 NOTE — Discharge Summary (Signed)
  [name], [name]                ACCOUNT NO.:  [id number]  MEDICAL RECORD NO.:  [id number]  LOCATION:  0503                          FACILITY:  BH  PHYSICIAN:  Franchot Gallo, MD     DATE OF BIRTH:  [birth year]  DATE OF ADMISSION:  12/20/2010 DATE OF DISCHARGE:  12/22/2010                              DISCHARGE SUMMARY   REASON FOR ADMISSION:  This is a 20 year old single female who presented initially at [facility], reporting that she was having suicidal thoughts to overdose on her medications.  She overdosed on 3 lithium and 7 Lexapro tablets.  FINAL IMPRESSION:  Axis I:  Mood disorder not otherwise specified. Axis II:  Deferred. Axis III:  No acute illnesses reported. Axis IV:  Noncompliance with medications. Axis V:  Global Assessment of Functioning at discharge 65.  PERTINENT LABS:  Lithium level was less than 0.25.  TSH of 1.01.  Urine drug screen was negative.  Urinalysis was negative.  SIGNIFICANT FINDINGS:  The patient was admitted to the adult milieu for safety and stabilization.  She was denying any suicidal thoughts and was physically feeling better.  She was participating in aftercare groups. We had contact with her mother to address any safety issues and for [place] to provide information on suicide risk factors, prevention and intervention.  Mother did have some concerns and was requesting a possible group home for the patient but was agreeable to having her return home.  On day of discharge, the patient was seen in the interdisciplinary treatment team.  Her sleep was good; appetite was good; depression had resolved, rating it a 1 on a scale of 1-10.  She was adamantly denying any suicidal or homicidal thoughts or auditory or visual hallucinations or delusional thinking.  Her anxiety had resolved, rating it a 1. Denied any medication side effects.  DISCHARGE MEDICATIONS: 1. Invega 6 mg 1 q.h.s. 2. Invega injection 1 every month. 3. Lexapro 20 mg 1 daily. 4.  Lithium carbonate 300 mg 1 b.i.d.  FOLLOWUP APPOINTMENT:  At [facility] at the walk-in clinic, phone number [phone].  The patient is to arrive on December 23, 2010, between the hours of 8 and 11.     Landry Corporal, N.P.   ______________________________ Franchot Gallo, MD    JO/MEDQ  D:  12/23/2010  T:  12/23/2010  Job:  [id number]  Electronically Signed by Limmie Patricia.P. on 12/23/2010 03:46:44 PM Electronically Signed by Franchot Gallo MD on 12/23/2010 04:43:37 PM

## 2010-12-29 NOTE — Assessment & Plan Note (Signed)
[name], [name]                ACCOUNT NO.:  [id number]  MEDICAL RECORD NO.:  [id number]  LOCATION:  0503                          FACILITY:  BH  PHYSICIAN:  Franchot Gallo, MD     DATE OF BIRTH:  [birth year]  DATE OF ADMISSION:  12/20/2010 DATE OF DISCHARGE:                      PSYCHIATRIC ADMISSION ASSESSMENT   This is an involuntary admission to the services of Dr. Harvie Heck [place].  This is a 20 year old, single, African American female.  She presented at [facility] this morning.  She reported to them that she wanted to overdose on her meds, that she missed her children, and she wanted to go shopping.  Apparently, Ms. Blue was with [place] back in April just for a day.  She had overdosed on 3 lithium and 7 Lexapro.  She was voicing suicidal thoughts with a plan, and she was facing a court date back then.  She did not feel her meds had been working well for her. Apparently after being discharged from inpatient here, she was put in jail where she had to serve 2 days for shoplifting.  Today, she also has an upcoming court date this upcoming Thursday.  She says it is through the mental health court and she does not think that it is for shoplifting this time.  PAST PSYCHIATRIC HISTORY:  She has been the patient of the [facility] on and off since she was an adolescent.  She has also had admissions to [facility], [facility], and she has maintained outpatient care through the [facility].  SOCIAL HISTORY:  She reports she went to the eleventh grade.  She has daughters, ages 38 and 2.  She is not employed.  She receives SSI for learning disabilities.  She turned in her daughter to a fire station, the 64-year-old year, 3 or 4 months ago, and hence both children were placed in foster care.  She lives with her mother.  FAMILY HISTORY:  She denies.  ALCOHOL AND DRUG HISTORY:  She denies.  PRIMARY CARE PROVIDER:  [facility].  She is followed now through  [place], formerly known [facility].  MEDICAL PROBLEMS:  None.  MEDICATIONS:  She reports lithium 300 mg a.m. and h.s., Invega a shot (dosage unknown), apparently she recently got this on the 22nd.  She is to have Invega 6 mg at h.s. and Lexapro 20 mg p.o. daily.  DRUG ALLERGIES:  Penicillin makes her face breakout.  POSITIVE PHYSICAL FINDINGS:  Obese, otherwise well-developed, well- nourished [place] American female who appears distracted.  She denies auditory or visual hallucinations, although she reports that she still feels suicidal a little bit, and she still wants to go shopping.  MENTAL STATUS EXAM:  She was alert and oriented.  She was casually groomed and dressed in hospital scrubs.  Her speech was a little bit slow.  Her mood is depressed.  Her affect is congruent.  She had poor eye contact.  Her thought processes are somewhat clear, rational and goal oriented, although very superficial.  Judgment and insight are poor.  Concentration and memory are fair.  Intelligence is average to below.  DIAGNOSES:   Axis I:  Mood disorder not  otherwise specified versus schizoaffective disorder versus major depressive disorder with psychotic features. Axis II:  Learning disability. Axis III:  None known. Axis IV:  Problems with primary support group and she has court this upcoming Thursday.  PLAN:  Admit for safety and stabilization.  We will check her lithium level.  We will check with [facility] regarding med compliance, and we will have the case manager check about the court date on Thursday.  Estimated length of stay is 3-5 days.     Mickie Leonarda Salon, P.A.-C.   ______________________________ Franchot Gallo, MD    MD/MEDQ  D:  12/20/2010  T:  12/20/2010  Job:  [id number]  Electronically Signed by Jaci Lazier ADAMS P.A.-C. on 12/27/2010 12:05:16 PM Electronically Signed by Franchot Gallo MD on 12/29/2010 05:12:09 PM

## 2011-01-14 ENCOUNTER — Emergency Department (HOSPITAL_COMMUNITY)
Admission: EM | Admit: 2011-01-14 | Discharge: 2011-01-16 | Disposition: A | Discharge status: Other Acute Inpatient Hospital | Payer: Medicaid Other | Source: Home / Self Care | Attending: Emergency Medicine | Admitting: Emergency Medicine

## 2011-01-14 DIAGNOSIS — F3289 Other specified depressive episodes: Secondary | ICD-10-CM | POA: Insufficient documentation

## 2011-01-14 DIAGNOSIS — Z79899 Other long term (current) drug therapy: Secondary | ICD-10-CM | POA: Insufficient documentation

## 2011-01-14 DIAGNOSIS — F329 Major depressive disorder, single episode, unspecified: Secondary | ICD-10-CM | POA: Insufficient documentation

## 2011-01-14 LAB — LITHIUM LEVEL: Lithium Lvl: 0.55 mEq/L — ABNORMAL LOW (ref 0.80–1.40)

## 2011-01-14 LAB — COMPREHENSIVE METABOLIC PANEL
ALT: 13 U/L (ref 0–35)
AST: 20 U/L (ref 0–37)
Albumin: 3.6 g/dL (ref 3.5–5.2)
CO2: 27 mEq/L (ref 19–32)
Calcium: 9.4 mg/dL (ref 8.4–10.5)
Sodium: 139 mEq/L (ref 135–145)
Total Protein: 7.4 g/dL (ref 6.0–8.3)

## 2011-01-14 LAB — ACETAMINOPHEN LEVEL: Acetaminophen (Tylenol), Serum: <15 ug/mL (ref 10–30)

## 2011-01-14 LAB — DIFFERENTIAL
Basophils Absolute: 0 10*3/uL (ref 0.0–0.1)
Eosinophils Relative: 3 % (ref 0–5)
Lymphocytes Relative: 26 % (ref 12–46)
Neutro Abs: 6.2 10*3/uL (ref 1.7–7.7)

## 2011-01-14 LAB — CBC
HCT: 35.9 % — ABNORMAL LOW (ref 36.0–46.0)
Platelets: 225 10*3/uL (ref 150–400)
RDW: 14.1 % (ref 11.5–15.5)
WBC: 10.1 10*3/uL (ref 4.0–10.5)

## 2011-01-15 LAB — URINALYSIS, ROUTINE W REFLEX MICROSCOPIC
Glucose, UA: NEGATIVE mg/dL
Hgb urine dipstick: NEGATIVE
Leukocytes, UA: NEGATIVE
Specific Gravity, Urine: 1.024 (ref 1.005–1.030)
pH: 5.5 (ref 5.0–8.0)

## 2011-01-15 LAB — RAPID URINE DRUG SCREEN, HOSP PERFORMED
Amphetamines: NOT DETECTED
Cocaine: NOT DETECTED
Opiates: NOT DETECTED
Tetrahydrocannabinol: NOT DETECTED

## 2011-01-16 ENCOUNTER — Inpatient Hospital Stay (HOSPITAL_COMMUNITY)
Admission: AD | Admit: 2011-01-16 | Discharge: 2011-01-23 | DRG: 885 | Disposition: A | Discharge status: Home / Self Care | Payer: Medicaid Other | Source: Ambulatory Visit | Attending: Psychiatry | Admitting: Psychiatry

## 2011-01-16 DIAGNOSIS — F70 Mild intellectual disabilities: Secondary | ICD-10-CM

## 2011-01-16 DIAGNOSIS — F639 Impulse disorder, unspecified: Secondary | ICD-10-CM

## 2011-01-16 DIAGNOSIS — Z733 Stress, not elsewhere classified: Secondary | ICD-10-CM

## 2011-01-16 DIAGNOSIS — F329 Major depressive disorder, single episode, unspecified: Secondary | ICD-10-CM

## 2011-01-16 DIAGNOSIS — F913 Oppositional defiant disorder: Secondary | ICD-10-CM

## 2011-01-16 DIAGNOSIS — T438X4A Poisoning by other psychotropic drugs, undetermined, initial encounter: Secondary | ICD-10-CM

## 2011-01-16 DIAGNOSIS — F259 Schizoaffective disorder, unspecified: Principal | ICD-10-CM

## 2011-01-16 DIAGNOSIS — T43502A Poisoning by unspecified antipsychotics and neuroleptics, intentional self-harm, initial encounter: Secondary | ICD-10-CM

## 2011-01-16 DIAGNOSIS — Z79899 Other long term (current) drug therapy: Secondary | ICD-10-CM

## 2011-01-16 DIAGNOSIS — T43294A Poisoning by other antidepressants, undetermined, initial encounter: Secondary | ICD-10-CM

## 2011-01-16 DIAGNOSIS — T438X1A Poisoning by other psychotropic drugs, accidental (unintentional), initial encounter: Secondary | ICD-10-CM

## 2011-01-17 DIAGNOSIS — F329 Major depressive disorder, single episode, unspecified: Secondary | ICD-10-CM

## 2011-01-23 LAB — URINE MICROSCOPIC-ADD ON

## 2011-01-23 LAB — WET PREP, GENITAL

## 2011-01-23 LAB — URINALYSIS, ROUTINE W REFLEX MICROSCOPIC
Glucose, UA: NEGATIVE
Hgb urine dipstick: NEGATIVE
Nitrite: POSITIVE — AB
Specific Gravity, Urine: 1.038 — ABNORMAL HIGH
Specific Gravity, Urine: 1.038 — ABNORMAL HIGH
Urobilinogen, UA: 1
Urobilinogen, UA: 1
pH: 6

## 2011-01-23 LAB — RPR
RPR Ser Ql: NONREACTIVE
RPR Ser Ql: NONREACTIVE

## 2011-01-23 LAB — POCT PREGNANCY, URINE
Operator id: 277751
Operator id: 294501
Preg Test, Ur: NEGATIVE

## 2011-01-23 LAB — GC/CHLAMYDIA PROBE AMP, GENITAL: Chlamydia, DNA Probe: NEGATIVE

## 2011-01-28 LAB — WET PREP, GENITAL
Trich, Wet Prep: NONE SEEN
Yeast Wet Prep HPF POC: NONE SEEN

## 2011-01-28 LAB — GC/CHLAMYDIA PROBE AMP, GENITAL
Chlamydia, DNA Probe: NEGATIVE
GC Probe Amp, Genital: NEGATIVE

## 2011-01-28 NOTE — Discharge Summary (Signed)
[name], [name]                ACCOUNT NO.:  [id number]  MEDICAL RECORD NO.:  [id number]  LOCATION:  0407                          FACILITY:  BH  PHYSICIAN:  Eulogio Ditch, MD DATE OF BIRTH:  1990/10/03  DATE OF ADMISSION:  01/16/2011 DATE OF DISCHARGE:  01/23/2011                              DISCHARGE SUMMARY   IDENTIFYING INFORMATION:  This is a that this is a 20 year old African American female.  This is a voluntary admission.  HISTORY OF PRESENT ILLNESS:  [name] presents after overdosing at home on 3 tablets of her lithium 300 mg and 3 tablets of her Lexapro 20 mg each.  She says that she was at home with her 25 year old sister and was on Facebook and went on the site of a female friend of hers and saw a photo of him with another woman.  She says she got upset, took the overdose, and is not sure why.  Says that she is not upset today and does not feel like harming herself.  PAST PSYCHIATRIC HISTORY:  She has a history of previous overdose in adolescence.  She has previous admissions to our adolescent unit.  Has a history of mild mental retardation and a history of impulsive behaviors. Also previously diagnosed with oppositional defiant disorder and impulse disorder.  MEDICAL EVALUATION:  She was met.  She was medically evaluated and cleared in our emergency room. This is a normally developed, overweight African American female.  No abnormal motor movements.  Motor is smooth.  Gait is normal. Admitting vital signs temperature 97, pulse 60, respirations 16, blood pressure 129/83, pulse ox is 98%.  She has no chronic medical problems.  CBC normal with a hemoglobin of 11.7.  Acetaminophen and salicylate screens are negative.  Lithium level 0.55.  Chemistries normal.  BUN 13, creatinine 0.70.  Liver enzymes normal.  Urine drug screen negative for all substances and routine urinalysis unremarkable.  Urine pregnancy test was negative.  COURSE OF HOSPITALIZATION:  She  was admitted initially to our mood disorders program and we initially did not restart her oral medications. She takes Invega/Sustenna injection [facility] monthly at [facility] and the information provided indicated she last received it January 14, 2011.  She was rather socially reclusive and needed guidance on our mood disorders program so we transferred her to the service of Dr. Eulogio Ditch in the acute stabilization unit on September 24.  We started her back on oral Invega 3 mg p.o. q.h.s. on the 26th and elected to continue her Lexapro 20 mg daily.  [name] lives with her mother.  She is on disability for mental illness. She was known to have a history of a previous admissions at [facility] and several other hospitals.  [name]'s participation in group therapy was satisfactory while here.  She required no restraint. She required encouragement and reminders for daily activities and group therapy.  After team discussion we elected to refer her to [facility] as community support team.  Suicide prevention information was provided and we coordinated with the mental health court coordinator where she is involved.  She was ready for discharge by September 28.  DISCHARGE/PLAN:  She will follow up with the community support team who will contact her at her home.  DISCHARGE MEDICATIONS:  Invega/Sustenna to continue monthly injections at [facility] Lexapro 20 mg daily, and Invega 3 mg p.o. q.h.s.  She is instructed to discontinue lithium.  DISCHARGE DIAGNOSIS:  Axis I:  Schizoaffective disorder. Axis II:  Borderline intellectual functioning. Axis III:  No diagnosis. Axis IV:  Deferred. Axis V: Current 50.  Past year not known.     Margaret A. Lorin Picket, N.P.   ______________________________ Eulogio Ditch, MD    MAS/MEDQ  D:  01/23/2011  T:  01/23/2011  Job:  [id number]  Electronically Signed by Kari Baars N.P. on 01/26/2011 10:10:03  AM Electronically Signed by Eulogio Ditch  on 01/28/2011 12:23:50 PM

## 2011-01-30 LAB — URINALYSIS, ROUTINE W REFLEX MICROSCOPIC
Bilirubin Urine: NEGATIVE
Hgb urine dipstick: NEGATIVE
Nitrite: NEGATIVE
Specific Gravity, Urine: 1.013 (ref 1.005–1.030)
pH: 6 (ref 5.0–8.0)

## 2011-02-09 LAB — URINALYSIS, ROUTINE W REFLEX MICROSCOPIC
Bilirubin Urine: NEGATIVE
Glucose, UA: NEGATIVE
Ketones, ur: NEGATIVE
Ketones, ur: NEGATIVE
Leukocytes, UA: NEGATIVE
Nitrite: NEGATIVE
Nitrite: NEGATIVE
Specific Gravity, Urine: 1.01
Specific Gravity, Urine: 1.02
Urobilinogen, UA: 0.2
pH: 6
pH: 6

## 2011-02-09 LAB — CBC
Hemoglobin: 10.4 — ABNORMAL LOW
Hemoglobin: 9.1 — ABNORMAL LOW
MCHC: 33.2
MCHC: 33.3
MCHC: 33.5
MCV: 75 — ABNORMAL LOW
MCV: 75.3 — ABNORMAL LOW
MCV: 75.9 — ABNORMAL LOW
Platelets: 159 — ABNORMAL LOW
Platelets: 165 — ABNORMAL LOW
RBC: 3.64 — ABNORMAL LOW
RBC: 3.89
RDW: 15.1 — ABNORMAL HIGH
WBC: 15.9 — ABNORMAL HIGH
WBC: 16.4 — ABNORMAL HIGH
WBC: 18.1 — ABNORMAL HIGH

## 2011-02-09 LAB — COMPREHENSIVE METABOLIC PANEL
ALT: 11
AST: 33
CO2: 26
Calcium: 8.8
Chloride: 108
Creatinine, Ser: 0.76
Glucose, Bld: 110 — ABNORMAL HIGH
Total Bilirubin: 0.8

## 2011-02-09 LAB — HEMOGLOBIN AND HEMATOCRIT, BLOOD
HCT: 31 — ABNORMAL LOW
Hemoglobin: 10.3 — ABNORMAL LOW

## 2011-02-09 LAB — LACTATE DEHYDROGENASE: LDH: 221

## 2011-02-09 LAB — RPR: RPR Ser Ql: NONREACTIVE

## 2011-02-09 LAB — CULTURE, BLOOD (ROUTINE X 2): Culture: NO GROWTH

## 2011-02-09 LAB — URINE MICROSCOPIC-ADD ON

## 2011-02-12 LAB — URINALYSIS, ROUTINE W REFLEX MICROSCOPIC
Ketones, ur: NEGATIVE
Nitrite: NEGATIVE
Protein, ur: NEGATIVE

## 2011-02-12 LAB — URINE MICROSCOPIC-ADD ON

## 2011-02-19 ENCOUNTER — Emergency Department (HOSPITAL_COMMUNITY)
Admission: EM | Admit: 2011-02-19 | Discharge: 2011-02-19 | Disposition: A | Discharge status: Home / Self Care | Payer: Medicaid Other | Attending: Emergency Medicine | Admitting: Emergency Medicine

## 2011-02-19 DIAGNOSIS — R109 Unspecified abdominal pain: Secondary | ICD-10-CM | POA: Insufficient documentation

## 2011-02-19 DIAGNOSIS — R630 Anorexia: Secondary | ICD-10-CM | POA: Insufficient documentation

## 2011-02-19 DIAGNOSIS — R112 Nausea with vomiting, unspecified: Secondary | ICD-10-CM | POA: Insufficient documentation

## 2011-02-19 DIAGNOSIS — K5289 Other specified noninfective gastroenteritis and colitis: Secondary | ICD-10-CM | POA: Insufficient documentation

## 2011-02-19 DIAGNOSIS — E669 Obesity, unspecified: Secondary | ICD-10-CM | POA: Insufficient documentation

## 2011-02-19 DIAGNOSIS — R197 Diarrhea, unspecified: Secondary | ICD-10-CM | POA: Insufficient documentation

## 2011-02-19 LAB — URINALYSIS, ROUTINE W REFLEX MICROSCOPIC
Glucose, UA: NEGATIVE mg/dL
Hgb urine dipstick: NEGATIVE
Ketones, ur: 15 mg/dL — AB
Leukocytes, UA: NEGATIVE
Protein, ur: >300 mg/dL — AB
pH: 5.5 (ref 5.0–8.0)

## 2011-02-19 LAB — COMPREHENSIVE METABOLIC PANEL
AST: 25 U/L (ref 0–37)
Albumin: 4 g/dL (ref 3.5–5.2)
BUN: 12 mg/dL (ref 6–23)
Chloride: 101 mEq/L (ref 96–112)
Creatinine, Ser: 0.8 mg/dL (ref 0.50–1.10)
Total Bilirubin: 0.4 mg/dL (ref 0.3–1.2)
Total Protein: 8.8 g/dL — ABNORMAL HIGH (ref 6.0–8.3)

## 2011-02-19 LAB — URINE MICROSCOPIC-ADD ON

## 2011-02-19 LAB — CBC
HCT: 38.1 % (ref 36.0–46.0)
Hemoglobin: 12.8 g/dL (ref 12.0–15.0)
MCH: 26.6 pg (ref 26.0–34.0)
MCHC: 33.6 g/dL (ref 30.0–36.0)
RBC: 4.81 MIL/uL (ref 3.87–5.11)

## 2011-02-19 LAB — DIFFERENTIAL
Lymphocytes Relative: 14 % (ref 12–46)
Lymphs Abs: 1.5 10*3/uL (ref 0.7–4.0)
Monocytes Absolute: 1.4 10*3/uL — ABNORMAL HIGH (ref 0.1–1.0)
Monocytes Relative: 13 % — ABNORMAL HIGH (ref 3–12)
Neutro Abs: 7.4 10*3/uL (ref 1.7–7.7)
Neutrophils Relative %: 71 % (ref 43–77)

## 2011-03-06 NOTE — Assessment & Plan Note (Signed)
  [name], TRUAX                ACCOUNT NO.:  [id number]  MEDICAL RECORD NO.:  [id number]  LOCATION:  0407                          FACILITY:  BH  PHYSICIAN:  Eulogio Ditch, MD DATE OF BIRTH:  1990-10-17  DATE OF ADMISSION:  01/16/2011 DATE OF DISCHARGE:  01/23/2011                      PSYCHIATRIC ADMISSION ASSESSMENT   IDENTIFYING INFORMATION:  This is a 20 year old, African American female.  This is a voluntary admission.  HISTORY OF PRESENT ILLNESS:  [name] presents after an overdose of lithium, took about 3 tablets of 300 mg each of lithium carbonate and 3 tablets of Lexapro, dose unknown.  She has a history of previous overdose as an adolescent and a history of mild mental retardation, and history of impulsive behaviors.  At the time of the overdose she reports that she was at home with her sister and had seen a photo and went on a Facebook site and saw the photo of a female friend of hers with another woman.  She got upset and so took the pills.  Said she is not upset today and does not feel like harming herself.  MEDICAL EVALUATION:  She was medically evaluated in our emergency room ,and examined here on the adult unit where we found no additional findings from the emergency room report.  This is a normally developed, overweight African American female with normal vital signs, temp 97, pulse 60, respirations 16, blood pressure 129/83 and a pulse ox of 98%. No history of chronic medical problems.  CBC was found to be essentially normal.  Lithium level was 0.55 in the emergency room.  BUN 13, creatinine 0.70, normal liver enzymes and urine drug screen negative for all substances.  Routine urinalysis unremarkable and a pregnancy test negative.  ADMITTING MENTAL STATUS EXAM:  Revealed a fully alert female, quiet manner.  Cooperative, directable, thinking concrete.  Fund of knowledge poor.  Insight very limited.  Judgment poor.  Mood neutral.   Memory intact.  ADMITTING DIAGNOSES:  Axis I:  Depressive disorder, NOS, rule out impulse control disorder. Axis II:  Deferred. Axis III:  Post lithium and Lexapro overdose. Axis IV:  Deferred. Axis V:  Current 35, past year not known.  PLAN:  To voluntarily admit her.  We need to get in contact with her mother and will hear about how she has been functioning at home and what kind of home supervision she gets.  Will consider possibility of placing her in a group home based on what her family feedback is.  We are going to hold off on her lithium at this point and the Lexapro which were her 2 home medications, and since she has a history of previous overdoses and will wait until we speak with her mother before making any medication decisions.     Margaret A. Lorin Picket, N.P.   ______________________________ Eulogio Ditch, MD    MAS/MEDQ  D:  02/26/2011  T:  02/26/2011  Job:  [id number]  Electronically Signed by Kari Baars N.P. on 03/04/2011 08:34:55 AM Electronically Signed by Eulogio Ditch  on 03/06/2011 10:11:39 AM

## 2011-09-28 ENCOUNTER — Emergency Department (HOSPITAL_COMMUNITY)
Admission: EM | Admit: 2011-09-28 | Discharge: 2011-09-28 | Disposition: A | Discharge status: Home / Self Care | Payer: Medicaid Other | Attending: Emergency Medicine | Admitting: Emergency Medicine

## 2011-09-28 ENCOUNTER — Encounter (HOSPITAL_COMMUNITY): Payer: Self-pay

## 2011-09-28 DIAGNOSIS — J029 Acute pharyngitis, unspecified: Secondary | ICD-10-CM

## 2011-09-28 DIAGNOSIS — Z87891 Personal history of nicotine dependence: Secondary | ICD-10-CM | POA: Insufficient documentation

## 2011-09-28 DIAGNOSIS — Z880 Allergy status to penicillin: Secondary | ICD-10-CM | POA: Insufficient documentation

## 2011-09-28 LAB — RAPID STREP SCREEN (MED CTR MEBANE ONLY): Streptococcus, Group A Screen (Direct): NEGATIVE

## 2011-09-28 MED ORDER — DICLOFENAC SODIUM 75 MG PO TBEC: 75.0000 mg | DELAYED_RELEASE_TABLET | Freq: Two times a day (BID) | ORAL | Status: DC

## 2011-09-28 MED ORDER — LIDOCAINE VISCOUS 2 % MT SOLN: 20.0000 mL | OROMUCOSAL | Status: AC | PRN

## 2011-09-28 NOTE — ED Provider Notes (Signed)
History     CSN: [id number]  Arrival date & time 09/28/11  1118   First MD Initiated Contact with Patient 09/28/11 1223      12:38 PM HPI She reports sore throat for almost a week. Reports painful swallowing. Reports no improvement despite using Chloraseptic. Denies fever, cough, difficulty breathing, neck pain, swollen lymph nodes, positive strep contact, facial swelling or dental pain  Patient is a 21 y.o. female presenting with pharyngitis. The history is provided by the patient.  Sore Throat This is a new problem. The current episode started in the past 7 days. The problem has been unchanged. Associated symptoms include a sore throat. Pertinent negatives include no abdominal pain, chills, congestion, coughing, fever, headaches, myalgias, nausea, neck pain, rash, swollen glands or vomiting. The symptoms are aggravated by swallowing. The treatment provided no relief.    History reviewed. No pertinent past medical history.  No past surgical history on file.  No family history on file.  History  Substance Use Topics  . Smoking status: Former Games developer  . Smokeless tobacco: Not on file  . Alcohol Use: No    OB History    Grav Para Term Preterm Abortions TAB SAB Ect Mult Living                  Review of Systems  Constitutional: Negative for fever and chills.  HENT: Positive for sore throat and trouble swallowing. Negative for ear pain, congestion, facial swelling, rhinorrhea, sneezing, neck pain, neck stiffness, postnasal drip and sinus pressure.   Respiratory: Negative for cough, shortness of breath and wheezing.   Gastrointestinal: Negative for nausea, vomiting and abdominal pain.  Musculoskeletal: Negative for myalgias.  Skin: Negative for rash.  Neurological: Negative for dizziness, light-headedness and headaches.  All other systems reviewed and are negative.    Allergies  Penicillins  Home Medications   Current Outpatient Rx  Name Route Sig Dispense Refill  .  PHENOL 1.4 % MT LIQD Mouth/Throat Use as directed 3 sprays in the mouth or throat every 2 (two) hours as needed. For sore throat.      BP 129/86  Pulse 75  Temp(Src) 98.6 F (37 C) (Oral)  Resp 18  SpO2 98%  LMP 08/28/2011  Physical Exam  Vitals reviewed. Constitutional: She is oriented to person, place, and time. Vital signs are normal. She appears well-developed and well-nourished. No distress.  HENT:  Head: Normocephalic and atraumatic.  Right Ear: Hearing, tympanic membrane, external ear and ear canal normal.  Left Ear: Hearing, tympanic membrane, external ear and ear canal normal.  Mouth/Throat: Uvula is midline, oropharynx is clear and moist and mucous membranes are normal. No oropharyngeal exudate, posterior oropharyngeal edema, posterior oropharyngeal erythema or tonsillar abscesses.  Eyes: Pupils are equal, round, and reactive to light.  Neck: Trachea normal, normal range of motion and phonation normal. Neck supple. No spinous process tenderness and no muscular tenderness present. No Brudzinski's sign and no Kernig's sign noted. No mass and no thyromegaly present.  Pulmonary/Chest: Effort normal.  Neurological: She is alert and oriented to person, place, and time.  Skin: Skin is warm and dry. No rash noted. No erythema. No pallor.  Psychiatric: She has a normal mood and affect. Her behavior is normal.    ED Course  Procedures   Results for orders placed during the hospital encounter of 09/28/11  RAPID STREP SCREEN      Component Value Range   Streptococcus, Group A Screen (Direct) NEGATIVE  NEGATIVE  MDM  Will treat pain with anti-inflammatory medication and viscous lidocaine. Advised likely patient has viral sore throat. Patient voices understanding and is ready for discharge        Thomasene Lot, Cordelia Poche 09/28/11 1334

## 2011-09-28 NOTE — ED Notes (Signed)
sts sore throat for 5-6 days, sts no help with at home meds, worse at night.

## 2011-09-28 NOTE — Discharge Instructions (Signed)
Sore Throat  Sore throats may be caused by bacteria and viruses. They may also be caused by:   Smoking.    Pollution.    Allergies.   If a sore throat is due to strep infection (a bacterial infection), you may need:   A throat swab.    A culture test to verify the strep infection.   You will need one of these:   An antibiotic shot.    Oral medicine for a full 10 days.   Strep infection is very contagious. A doctor should check any close contacts who have a sore throat or fever. A sore throat caused by a virus infection will usually last only 3-4 days. Antibiotics will not treat a viral sore throat.    Infectious mononucleosis (a viral disease), however, can cause a sore throat that lasts for up to 3 weeks. Mononucleosis can be diagnosed with blood tests. You must have been sick for at least 1 week in order for the test to give accurate results.  HOME CARE INSTRUCTIONS     To treat a sore throat, take mild pain medicine.    Increase your fluids.    Eat a soft diet.    Do not smoke.    Gargling with warm water or salt water (1 tsp. salt in 8 oz. water) can be helpful.    Try throat sprays or lozenges or sucking on hard candy to ease the symptoms.   Call your doctor if your sore throat lasts longer than 1 week.   SEEK IMMEDIATE MEDICAL CARE IF:   You have difficulty breathing.    You have increased swelling in the throat.    You have pain so severe that you are unable to swallow fluids or your saliva.    You have a severe headache, a high fever, vomiting, or a red rash.   Document Released: 05/21/2004 Document Revised: 04/02/2011 Document Reviewed: 03/31/2007  ExitCare Patient Information 2012 [employer].

## 2011-09-28 NOTE — ED Provider Notes (Signed)
Medical screening examination/treatment/procedure(s) were performed by non-physician practitioner and as supervising physician I was immediately available for consultation/collaboration.   Dionisio Aragones, MD 09/28/11 1803 

## 2011-11-28 ENCOUNTER — Encounter (HOSPITAL_COMMUNITY): Payer: Self-pay | Admitting: *Deleted

## 2011-11-28 ENCOUNTER — Emergency Department (HOSPITAL_COMMUNITY)
Admission: EM | Admit: 2011-11-28 | Discharge: 2011-11-28 | Disposition: A | Discharge status: Home / Self Care | Payer: Medicaid Other | Attending: Emergency Medicine | Admitting: Emergency Medicine

## 2011-11-28 DIAGNOSIS — B349 Viral infection, unspecified: Secondary | ICD-10-CM

## 2011-11-28 DIAGNOSIS — J029 Acute pharyngitis, unspecified: Secondary | ICD-10-CM | POA: Insufficient documentation

## 2011-11-28 LAB — RAPID STREP SCREEN (MED CTR MEBANE ONLY): Streptococcus, Group A Screen (Direct): NEGATIVE

## 2011-11-28 LAB — POCT PREGNANCY, URINE: Preg Test, Ur: NEGATIVE

## 2011-11-28 MED ORDER — PREDNISONE 20 MG PO TABS: 20.0000 mg | ORAL_TABLET | Freq: Two times a day (BID) | ORAL | Status: AC

## 2011-11-28 NOTE — ED Provider Notes (Signed)
History  Patient complaining of pain on swallowing and some feeling of swelling on the left side of her mouth for the past 2 weeks. The pain is about 7/10 and worsened by swallowing. She denies fever, chills, nausea, vomiting, all general body weakness. She had similar symptoms several weeks back, which was treated with unspecified medicines followed by improvement but the symptoms have recurred. She has a history of snoring. She is very anxious that this could be caused by a sexually transmitted infection as she reports to have performed oral sex one year ago.   CSN: [id number]  Arrival date & time 11/28/11  6213   First MD Initiated Contact with Patient 11/28/11 0750      Chief Complaint  Patient presents with  . Sore Throat    (Consider location/radiation/quality/duration/timing/severity/associated sxs/prior treatment) HPI  History reviewed. No pertinent past medical history.  History reviewed. No pertinent past surgical history.  No family history on file.  History  Substance Use Topics  . Smoking status: Former Games developer  . Smokeless tobacco: Not on file  . Alcohol Use: No    OB History    Grav Para Term Preterm Abortions TAB SAB Ect Mult Living                  Review of Systems  Constitutional: Negative for activity change and appetite change.  HENT: Positive for facial swelling. Negative for ear pain, nosebleeds, rhinorrhea, sneezing, neck pain, postnasal drip and ear discharge.   Eyes: Negative for discharge and itching.  Respiratory: Negative for apnea, cough, choking and wheezing.   Cardiovascular: Negative for chest pain, palpitations and leg swelling.  Genitourinary: Negative for urgency, frequency, decreased urine volume, difficulty urinating, genital sores, menstrual problem, pelvic pain and dyspareunia.  Neurological: Negative for dizziness, facial asymmetry and numbness.    Allergies  Penicillins  Home Medications   Current Outpatient Rx  Name Route  Sig Dispense Refill  . ESCITALOPRAM OXALATE 20 MG PO TABS Oral Take 20 mg by mouth daily.      BP 142/94  Temp 98.3 F (36.8 C) (Oral)  Resp 18  SpO2 98%  Physical Exam  Constitutional: She is oriented to person, place, and time. She appears well-nourished.  HENT:  Head: Normocephalic and atraumatic.  Mouth/Throat: Uvula is midline. No oral lesions. Uvula swelling present. No dental abscesses, lacerations or dental caries. Posterior oropharyngeal erythema present. No oropharyngeal exudate, posterior oropharyngeal edema or tonsillar abscesses.    Eyes: Left eye exhibits discharge.  Neck: Normal range of motion.  Cardiovascular: Normal rate, regular rhythm and normal heart sounds.   Pulmonary/Chest: No stridor. No respiratory distress. She has no wheezes. She has no rales. She exhibits no tenderness.  Abdominal: She exhibits no distension and no mass. There is no tenderness. There is no rebound and no guarding.  Lymphadenopathy:    She has no cervical adenopathy.  Neurological: She is alert and oriented to person, place, and time.  Psychiatric: She has a normal mood and affect. Thought content normal.    ED Course  Procedures None   Results for orders placed during the hospital encounter of 11/28/11  RAPID STREP SCREEN      Component Value Range   Streptococcus, Group A Screen (Direct) NEGATIVE  NEGATIVE  POCT PREGNANCY, URINE      Component Value Range   Preg Test, Ur NEGATIVE  NEGATIVE   No results found. No diagnosis found.    MDM  Patient is reporting with sore throat, which  I believe is related to some sort of infection, most likely viral rather than bacterial. Strep test in the ER is negative. In the past, she has had similar symptoms, which improved with pain meds since. Without fever or chills I believe this is a viral pharyngitis with identified hypertrophy proceed with anti-inflammatory treatment. She has been reassured that this is not connected to a sexually  transmitted infection.        Dow Adolph, MD 11/28/11 [phone]

## 2011-11-28 NOTE — ED Notes (Signed)
Pt to ED c/o throat pain.  She states she was tx here about 1 month ago for the same s/s (not with antibiotics).  The pain improved, but it has increased again.  Pt with swollen tonsils.  Pt is afraid that she has and std b/c she performed oral sex 1 year ago.

## 2011-11-29 NOTE — ED Provider Notes (Signed)
I saw and evaluated the patient, reviewed the resident's note and I agree with the findings and plan.  I saw the patient along with Dr. Zada Girt.  She presents complaining of sore throat for the past several days.  It is worse with swallowing and has not gotten relief with otc meds.  She denies fevers or chills. There is no nausea or vomiting.  On exam, the po is mildly erythematous without exudates.  There is no cervical adenopathy.  The heart and lung exam is unremarkable.  The strep test is negative, and I suspect a viral etiology.  She is concerned that this may be an std as she reports performing oral sex several months ago.  I doubt this is the case.  She will be treated with prednisone as an anti-inflammatory and follow up prn if she worsens.  Geoffery Lyons, MD 11/29/11 0830

## 2011-12-15 ENCOUNTER — Encounter (HOSPITAL_COMMUNITY): Payer: Self-pay | Admitting: Emergency Medicine

## 2011-12-15 ENCOUNTER — Emergency Department (HOSPITAL_COMMUNITY)
Admission: EM | Admit: 2011-12-15 | Discharge: 2011-12-15 | Disposition: A | Discharge status: Home / Self Care | Payer: Medicaid Other | Attending: Emergency Medicine | Admitting: Emergency Medicine

## 2011-12-15 DIAGNOSIS — Z880 Allergy status to penicillin: Secondary | ICD-10-CM | POA: Insufficient documentation

## 2011-12-15 DIAGNOSIS — Z87891 Personal history of nicotine dependence: Secondary | ICD-10-CM | POA: Insufficient documentation

## 2011-12-15 DIAGNOSIS — J029 Acute pharyngitis, unspecified: Secondary | ICD-10-CM | POA: Insufficient documentation

## 2011-12-15 LAB — RAPID STREP SCREEN (MED CTR MEBANE ONLY): Streptococcus, Group A Screen (Direct): NEGATIVE

## 2011-12-15 MED ORDER — IBUPROFEN 400 MG PO TABS
600.0000 mg | ORAL_TABLET | Freq: Once | ORAL | Status: AC
  Administered 2011-12-15: 600 mg via ORAL
  Filled 2011-12-15: qty 1

## 2011-12-15 NOTE — ED Provider Notes (Signed)
History     CSN: [id number]  Arrival date & time 12/15/11  1634   First MD Initiated Contact with Patient 12/15/11 1651      Chief Complaint  Patient presents with  . Sore Throat    (Consider location/radiation/quality/duration/timing/severity/associated sxs/prior treatment) Patient is a 21 y.o. female presenting with pharyngitis. The history is provided by the patient.  Sore Throat Pertinent negatives include no abdominal pain, no headaches and no shortness of breath.  pt c/o sore throat for past day. Constant. Dull, non radiating pain. Worse w swallowing. Is able to swallow. No sob or trouble breathing. No runny nose. No cough. No headache. No rash. No body aches. No fever/chills. States in past few months hx frequent sore throats.      History reviewed. No pertinent past medical history.  History reviewed. No pertinent past surgical history.  History reviewed. No pertinent family history.  History  Substance Use Topics  . Smoking status: Former Games developer  . Smokeless tobacco: Not on file  . Alcohol Use: No    OB History    Grav Para Term Preterm Abortions TAB SAB Ect Mult Living                  Review of Systems  Constitutional: Negative for fever and chills.  Respiratory: Negative for cough and shortness of breath.   Gastrointestinal: Negative for vomiting and abdominal pain.  Skin: Negative for rash.  Neurological: Negative for headaches.    Allergies  Penicillins  Home Medications   Current Outpatient Rx  Name Route Sig Dispense Refill  . ASENAPINE MALEATE 5 MG SL SUBL Sublingual Place 5 mg under the tongue daily.    Marland Kitchen ESCITALOPRAM OXALATE 20 MG PO TABS Oral Take 20 mg by mouth daily.      BP 143/105  Pulse 81  Temp 99.5 F (37.5 C) (Oral)  Resp 18  SpO2 97%  Physical Exam  Nursing note and vitals reviewed. Constitutional: She is oriented to person, place, and time. She appears well-developed and well-nourished. No distress.  HENT:  Nose:  Nose normal.       Quite prominent tonsils bilaterally. No asymmetric swelling or abscess seen. Erythema. Scant exudate.   Eyes: Conjunctivae are normal. No scleral icterus.  Neck: Normal range of motion. Neck supple. No tracheal deviation present. No thyromegaly present.       Mild ant cerv l/a. No post cerv l/a.   Cardiovascular: Normal rate.   Pulmonary/Chest: Effort normal and breath sounds normal. No respiratory distress.  Abdominal: Soft. Normal appearance. She exhibits no distension. There is no tenderness.       No hsm.   Musculoskeletal: She exhibits no edema and no tenderness.  Neurological: She is alert and oriented to person, place, and time.  Skin: Skin is warm and dry. No rash noted.  Psychiatric: She has a normal mood and affect.    ED Course  Procedures (including critical care time)   Results for orders placed during the hospital encounter of 12/15/11  RAPID STREP SCREEN      Component Value Range   Streptococcus, Group A Screen (Direct) NEGATIVE  NEGATIVE       MDM  Strep test.   Motrin po.   Given report of frequent sore throats/pharyngitis, enlarged tonsils, will refer to ent, possible consideration tonsillectomy electively as outpt.         Suzi Roots, MD 12/15/11 1750

## 2011-12-15 NOTE — ED Notes (Signed)
Pt here for sore throat x 2 days; pt sts hx of same; pt with some fever and chills

## 2011-12-18 ENCOUNTER — Emergency Department (HOSPITAL_COMMUNITY)
Admission: EM | Admit: 2011-12-18 | Discharge: 2011-12-18 | Disposition: A | Discharge status: Home / Self Care | Payer: Medicaid Other | Attending: Emergency Medicine | Admitting: Emergency Medicine

## 2011-12-18 ENCOUNTER — Encounter (HOSPITAL_COMMUNITY): Payer: Self-pay | Admitting: Emergency Medicine

## 2011-12-18 ENCOUNTER — Emergency Department (HOSPITAL_COMMUNITY): Payer: Medicaid Other

## 2011-12-18 DIAGNOSIS — R599 Enlarged lymph nodes, unspecified: Secondary | ICD-10-CM | POA: Insufficient documentation

## 2011-12-18 DIAGNOSIS — J312 Chronic pharyngitis: Secondary | ICD-10-CM

## 2011-12-18 DIAGNOSIS — J309 Allergic rhinitis, unspecified: Secondary | ICD-10-CM

## 2011-12-18 DIAGNOSIS — R590 Localized enlarged lymph nodes: Secondary | ICD-10-CM

## 2011-12-18 LAB — CBC WITH DIFFERENTIAL/PLATELET
Basophils Relative: 0 % (ref 0–1)
Eosinophils Absolute: 0.3 10*3/uL (ref 0.0–0.7)
Eosinophils Relative: 3 % (ref 0–5)
Hemoglobin: 12 g/dL (ref 12.0–15.0)
Lymphs Abs: 2.2 10*3/uL (ref 0.7–4.0)
MCH: 26.5 pg (ref 26.0–34.0)
MCHC: 32.9 g/dL (ref 30.0–36.0)
MCV: 80.6 fL (ref 78.0–100.0)
Monocytes Absolute: 0.5 10*3/uL (ref 0.1–1.0)
Monocytes Relative: 5 % (ref 3–12)
Neutrophils Relative %: 70 % (ref 43–77)
RBC: 4.53 MIL/uL (ref 3.87–5.11)

## 2011-12-18 LAB — BASIC METABOLIC PANEL
Calcium: 9.5 mg/dL (ref 8.4–10.5)
GFR calc Af Amer: >90 mL/min (ref >90)
GFR calc non Af Amer: >90 mL/min (ref >90)
Potassium: 3.8 mEq/L (ref 3.5–5.1)
Sodium: 141 mEq/L (ref 135–145)

## 2011-12-18 LAB — RAPID HIV SCREEN (WH-MAU): Rapid HIV Screen: NONREACTIVE

## 2011-12-18 LAB — POCT PREGNANCY, URINE: Preg Test, Ur: NEGATIVE

## 2011-12-18 MED ORDER — IOHEXOL 300 MG/ML  SOLN
75.0000 mL | Freq: Once | INTRAMUSCULAR | Status: AC | PRN
  Administered 2011-12-18: 75 mL via INTRAVENOUS

## 2011-12-18 MED ORDER — LORATADINE 10 MG PO TABS
10.0000 mg | ORAL_TABLET | Freq: Once | ORAL | Status: AC
  Administered 2011-12-18: 10 mg via ORAL
  Filled 2011-12-18 (×2): qty 1

## 2011-12-18 MED ORDER — LORATADINE 10 MG PO TABS: 10.0000 mg | ORAL_TABLET | Freq: Every day | ORAL | Status: DC

## 2011-12-18 MED ORDER — DIPHENHYDRAMINE HCL 25 MG PO CAPS
25.0000 mg | ORAL_CAPSULE | Freq: Once | ORAL | Status: AC
  Administered 2011-12-18: 25 mg via ORAL
  Filled 2011-12-18: qty 1

## 2011-12-18 MED ORDER — HYDROCODONE-ACETAMINOPHEN 5-325 MG PO TABS: 2.0000 | ORAL_TABLET | ORAL | Status: AC | PRN

## 2011-12-18 NOTE — ED Notes (Signed)
Pt c/o sore throat with pain in left side of neck and left arm x several weeks; pt seen here for same

## 2011-12-18 NOTE — ED Notes (Signed)
Pt presents with 2-3 week h/o difficulty swallowing.  Pt denies any cold symptoms, or sick contact.  Pt unsure of fever, denies that voice is muffled, pt is able to handle secretions.

## 2011-12-18 NOTE — ED Provider Notes (Signed)
History  This chart was scribed for Jones Skene, MD by Shari Heritage. The patient was seen in room TR09C/TR09C. Patient's care was started at 1353.     CSN: [id number]  Arrival date & time 12/18/11  1353   First MD Initiated Contact with Patient 12/18/11 1439      Chief Complaint  Patient presents with  . Sore Throat  . Neck Pain    Patient is a 21 y.o. female presenting with pharyngitis. The history is provided by the patient. No language interpreter was used.  Sore Throat This is a recurrent problem. The current episode started more than 1 week ago. The problem has not changed (significantly) since onset.Pertinent negatives include no headaches.   Jude A Fulop is a 21 y.o. female who presents to the Emergency Department complaining of constant, dull, moderate sore throat. She rates the pain as 6/10. Patient says that she has had intermittent sore throat pain for 3 months. She states that she began seeing white patches on her throat 1 month ago. Other symptoms include right-sided neck pain and right shoulder pain. Patient says the pain is moderate in severity and is achy in quality. She states that she also has occasional chest pain. She denies otalgia or trouble swallowing. No fever or chills. No cough. No HA. No rhinorrhea or nasal congestion. Patient denies voice change. Patient denies any seasonal allergies. She reports no other significant medical, surgical or family history.   Patient says that she does not have a PCP. She is a former [facility] patient. She takes Lexapro daily. She says that she recently began taking Saphris which was prescribed by her psychiatrist.   Previous Chart Review Patient was most recently seen here on 12/15/11 by Dr. Cathren Laine complaining of sore throat that worsened with swallowing. There were no other associated symptoms. Patient denied trouble swallowing, SOB, HA or cold symptoms. Upon physical exam, Dr. Denton Lank noted that she prominent tonsils  bilaterally with erythema and scant exudate. There was no swelling or abscess. Patient had a rapid strep screen which was negative. Ibuprofen was administered in the ED. Patient was referred to an ENT and discharged home with pharyngitis diagnosis.   History  Substance Use Topics  . Smoking status: Former Games developer  . Smokeless tobacco: Not on file  . Alcohol Use: No    OB History    Grav Para Term Preterm Abortions TAB SAB Ect Mult Living                  Review of Systems  Constitutional: Negative for fever and chills.  HENT: Positive for sore throat and neck pain. Negative for ear pain, congestion, rhinorrhea, trouble swallowing and voice change.   Respiratory: Negative for cough.   Neurological: Negative for headaches.    Allergies  Penicillins  Home Medications   Current Outpatient Rx  Name Route Sig Dispense Refill  . ASENAPINE MALEATE 5 MG SL SUBL Sublingual Place 5 mg under the tongue daily.    Marland Kitchen DICLOFENAC SODIUM 75 MG PO TBEC Oral Take 75 mg by mouth 2 (two) times daily.    Marland Kitchen ESCITALOPRAM OXALATE 20 MG PO TABS Oral Take 20 mg by mouth daily.      BP 145/88  Pulse 82  Temp 99.1 F (37.3 C) (Oral)  Resp 16  SpO2 99%  Physical Exam  Nursing note and vitals reviewed. Constitutional: She is oriented to person, place, and time. She appears well-developed and well-nourished. No distress.  HENT:  Head:  Normocephalic and atraumatic.  Right Ear: External ear normal.  Left Ear: External ear normal.  Nose: Mucosal edema and rhinorrhea present.  Mouth/Throat: Oropharynx is clear and moist.    Eyes: Conjunctivae and EOM are normal. Pupils are equal, round, and reactive to light. Right eye exhibits no discharge. Left eye exhibits no discharge.  Neck: Neck supple. No tracheal deviation present.    Cardiovascular: Normal rate, regular rhythm and normal heart sounds.   Pulmonary/Chest: Effort normal. No respiratory distress.  Abdominal: She exhibits no distension.    Musculoskeletal: Normal range of motion.  Lymphadenopathy:    She has cervical adenopathy.  Neurological: She is alert and oriented to person, place, and time. No sensory deficit.  Skin: Skin is dry.  Psychiatric: She has a normal mood and affect. Her behavior is normal.    ED Course  Procedures (including critical care time) DIAGNOSTIC STUDIES: Oxygen Saturation is 99% on room air, normal by my interpretation.    COORDINATION OF CARE: 2:34pm- Patient informed of current plan for treatment and evaluation and agrees with plan at this time. Will order CT of neck, pregnancy test and basic metabolic panel.  Results for orders placed during the hospital encounter of 12/18/11  BASIC METABOLIC PANEL      Component Value Range   Sodium 141  135 - 145 mEq/L   Potassium 3.8  3.5 - 5.1 mEq/L   Chloride 106  96 - 112 mEq/L   CO2 27  19 - 32 mEq/L   Glucose, Bld 85  70 - 99 mg/dL   BUN 15  6 - 23 mg/dL   Creatinine, Ser 1.61  0.50 - 1.10 mg/dL   Calcium 9.5  8.4 - 09.6 mg/dL   GFR calc non Af Amer >90  >90 mL/min   GFR calc Af Amer >90  >90 mL/min  POCT PREGNANCY, URINE      Component Value Range   Preg Test, Ur NEGATIVE  NEGATIVE    Ct Soft Tissue Neck W Contrast  12/18/2011  *RADIOLOGY REPORT*  Clinical Data: 21 year old female with left side neck pain, throat pain, painful swallowing.  CT NECK WITH CONTRAST  Technique:  Multidetector CT imaging of the neck was performed with intravenous contrast.  Contrast: 75mL OMNIPAQUE IOHEXOL 300 MG/ML  SOLN  Comparison: None.  Findings: Lung apices are clear.  Negative visualized superior mediastinum.  Negative thyroid, larynx, hypopharynx, parapharyngeal spaces, retropharyngeal space, sublingual space, submandibular glands and parotid glands.  Adenoid and tonsillar pillar hypertrophy.  Tonsil size greater on the left.  No intratonsillar or peritonsillar abscess or inflammatory stranding.  There is also hypertrophy of the lingual tonsil.  13-14 mm  short axis bilateral level IIA lymph nodes.  Other bilateral cervical lymph nodes are within normal limits.  Visualized major vascular structures are patent.  Visualized orbit soft tissues are within normal limits.  Negative visualized brain parenchyma.  Occasional paranasal sinus mucous retention cyst.  Otherwise clear paranasal sinuses and mastoids. No acute osseous abnormality identified.  IMPRESSION: 1.  Symmetric adenoid and tonsillar hypertrophy.  Symmetric mild / reactive level II lymphadenopathy. Favor these findings relate to acute or recent upper respiratory tract infection.  Clinical follow-up recommended, and if the lymph nodes fail to resolve recommend ENT referral. 2.  Otherwise normal neck CT.   Original Report Authenticated By: Harley Hallmark, M.D.      No diagnosis found.    MDM  LIBBY GOEHRING is a 21 y.o. female who presents with a concerning  pattern of emergency room visits since June for recurrent throat pain. In reading her past medical history, it is evident that his thoughts she's had prior viral versus bacterial infections of the throat multiple times since June. On physical exam the patient does indeed have enlarged cervical lymphadenopathy, not really posterior lymphadenopathy however she does have large tonsils with some mild erythema. There is some mild posterior pharynx erythema which is likely due to postnasal drip as her needle turbinates are boggy and there is clear nasal drainage. Was most concerning is that she continues to have this adenopathy. I do not think she has strep throat either, the tonsils are enlarged symmetrically I do not think the patient has a retropharyngeal abscess or peritonsillar abscess. I think at this point, I need to work the patient up further if so obtain a CT scan with contrast of the soft tissues of the neck to evaluate further.  CT scan of the neck as read by radiology shows symmetrical adenoid and tonsillar hypertrophy with "symmetric  mild/reactive level II lymphadenopathy favor these findings relate to acute or recent upper respiratory tract infection. Clinical followup recommended, and if the lymph nodes fail to resolve recommend ENT referral. 2. Otherwise normal neck CT." - Patient has not followed up with ENT.  Will again refer the patient to ENT because his presentation is still a bit odd. In addition I will send an HIV test.  Patient's labs today were otherwise within normal limits including a BMP and CBC.  We'll treat her ball of boggy nasal turbinates with some Claritin and Benadryl. Patient does have some medicine to gargle with - muscular some pain medicine for her shoulder pain.  At this time it does not appear the patient has leukemia, lymphoma or anything else that we need to deal with in an emergent fashion I have stressed again again the patient needs to followup with ENT. I've instructed that if the [facility] whom I referred her to do not accept Medicaid she should call back emergency department, and leave a message for me so that I may find her an appropriate ENT physician to evaluate the patient.  I explained the diagnosis in detail and have given standard ER return precautions including chest pain, shortness of breath throat swelling, change in voice, or any other concerning symptoms, . The patient understands and accepts the medical plan as it's been dictated and I have answered all questions. Discharge instructions concerning home care and prescriptions have been given.  The patient is STABLE and is discharged to home in good condition.     Jones Skene, MD 12/18/11 2130

## 2012-03-30 ENCOUNTER — Encounter (HOSPITAL_COMMUNITY): Payer: Self-pay

## 2012-03-30 ENCOUNTER — Emergency Department (HOSPITAL_COMMUNITY)
Admission: EM | Admit: 2012-03-30 | Discharge: 2012-03-31 | Disposition: A | Discharge status: Home / Self Care | Payer: Medicaid Other | Attending: Emergency Medicine | Admitting: Emergency Medicine

## 2012-03-30 DIAGNOSIS — J351 Hypertrophy of tonsils: Secondary | ICD-10-CM

## 2012-03-30 DIAGNOSIS — Z87891 Personal history of nicotine dependence: Secondary | ICD-10-CM | POA: Insufficient documentation

## 2012-03-30 NOTE — ED Notes (Signed)
Pt reports (L) side headache, (L) side throat pain, and nasal congestion x2 days.

## 2012-03-31 MED ORDER — PREDNISONE 20 MG PO TABS
60.0000 mg | ORAL_TABLET | Freq: Once | ORAL | Status: AC
  Administered 2012-03-31: 60 mg via ORAL
  Filled 2012-03-31: qty 3

## 2012-03-31 MED ORDER — HYDROMORPHONE HCL PF 1 MG/ML IJ SOLN
1.0000 mg | Freq: Once | INTRAMUSCULAR | Status: AC
  Administered 2012-03-31: 1 mg via INTRAMUSCULAR
  Filled 2012-03-31: qty 1

## 2012-03-31 MED ORDER — PREDNISONE 20 MG PO TABS: 20.0000 mg | ORAL_TABLET | Freq: Two times a day (BID) | ORAL | Status: DC

## 2012-03-31 MED ORDER — HYDROCODONE-ACETAMINOPHEN 5-325 MG PO TABS: 1.0000 | ORAL_TABLET | ORAL | Status: DC | PRN

## 2012-03-31 NOTE — ED Provider Notes (Signed)
History     CSN: [id number]  Arrival date & time 03/30/12  2312   None     Chief Complaint  Patient presents with  . Headache    (Consider location/radiation/quality/duration/timing/severity/associated sxs/prior treatment) HPI History provided by pt and prior chart.  Per prior chart, pt seen three times in 11/2011 for recurrent tonsillar edema and pain.  At most recent visit, CT performed which showed symmetric adenoid and tonsillar hypertrophy as well as reactive lymphadenopathy.  Pt tested for HIV as well which was non-reactive.  Referred to ENT multiple times.  Returns to ED today because the same sx have returned; onset 6 hours ago.  Pain radiates to the entire left side of her face, including temple.  Has not taken anything for pain.  No associated fever, nasal congestion, rhinorrhea, dysphagia, dyspnea.  No PMH. Has not yet seen ENT because the last time she called to schedule an appointment, she was told that the referral had to be made a PCP.  History reviewed. No pertinent past medical history.  Past Surgical History  Procedure Date  . Cesarean section     History reviewed. No pertinent family history.  History  Substance Use Topics  . Smoking status: Former Games developer  . Smokeless tobacco: Not on file  . Alcohol Use: No    OB History    Grav Para Term Preterm Abortions TAB SAB Ect Mult Living                  Review of Systems  All other systems reviewed and are negative.    Allergies  Penicillins  Home Medications   Current Outpatient Rx  Name  Route  Sig  Dispense  Refill  . LORATADINE 10 MG PO TABS   Oral   Take 1 tablet (10 mg total) by mouth daily.         Marland Kitchen HYDROCODONE-ACETAMINOPHEN 5-325 MG PO TABS   Oral   Take 1 tablet by mouth every 4 (four) hours as needed for pain.   20 tablet   0   . PREDNISONE 20 MG PO TABS   Oral   Take 1 tablet (20 mg total) by mouth 2 (two) times daily.   10 tablet   0     BP 135/84  Pulse 118  Temp 99 F  (37.2 C) (Oral)  Resp 18  SpO2 99%  LMP 03/30/2012  Physical Exam  Nursing note and vitals reviewed. Constitutional: She is oriented to person, place, and time. She appears well-developed and well-nourished. No distress.       Uncomfortable appearing  HENT:  Head: Normocephalic and atraumatic.       Symmetric edema of tonsils.  Nearly touching. No erythema or exudate. Pt is able to swallow.  Uvula mid-line and no trismus.  No jaw claudication.  No tenderness of temple.    Eyes:       Normal appearance  Neck: Normal range of motion.  Cardiovascular: Normal rate, regular rhythm and intact distal pulses.   Pulmonary/Chest: Effort normal and breath sounds normal.  Musculoskeletal: Normal range of motion.  Lymphadenopathy:    She has no cervical adenopathy.  Neurological: She is alert and oriented to person, place, and time. No sensory deficit. Coordination normal.       CN 3-12 intact.  No nystagmus. 5/5 and equal upper and lower extremity strength.  No past pointing.     Skin: Skin is warm and dry. No rash noted.  Psychiatric: She has  a normal mood and affect. Her behavior is normal.    ED Course  Procedures (including critical care time)   Labs Reviewed  RAPID STREP SCREEN   No results found.   1. Tonsillar hypertrophy       MDM  Healthy 21yo F w/ recurrent tonsillar hypertrophy, presents to ED w/ tonsillar edema and pain x 6 hours.  Pt afebrile, uncomfortable appearing, tonsillar edema w/out erythema, exudate, uvula deviation or trismus.  Will treat symptomatically w/ prednisone and vicodin.  Pt instructed once again to f/u with ENT.  I told her that if she is turned down again for any reason,  she should call the ED for assistance.  Pain improved in ED w/ Im dilaudid and 60mg  prednisone.  Return precautions discussed.         Otilio Miu, PA-C 03/31/12 [phone]

## 2012-03-31 NOTE — ED Provider Notes (Signed)
Medical screening examination/treatment/procedure(s) were performed by non-physician practitioner and as supervising physician I was immediately available for consultation/collaboration.  [name] Asebedo M Scotlynn Noyes, MD 03/31/12 0712 

## 2012-04-27 ENCOUNTER — Encounter (HOSPITAL_COMMUNITY): Payer: Self-pay | Admitting: Emergency Medicine

## 2012-04-27 ENCOUNTER — Emergency Department (HOSPITAL_COMMUNITY)
Admission: EM | Admit: 2012-04-27 | Discharge: 2012-04-27 | Disposition: A | Discharge status: Home / Self Care | Payer: Medicaid Other | Attending: Emergency Medicine | Admitting: Emergency Medicine

## 2012-04-27 DIAGNOSIS — Z79899 Other long term (current) drug therapy: Secondary | ICD-10-CM | POA: Insufficient documentation

## 2012-04-27 DIAGNOSIS — Z87891 Personal history of nicotine dependence: Secondary | ICD-10-CM | POA: Insufficient documentation

## 2012-04-27 DIAGNOSIS — K529 Noninfective gastroenteritis and colitis, unspecified: Secondary | ICD-10-CM

## 2012-04-27 DIAGNOSIS — K5289 Other specified noninfective gastroenteritis and colitis: Secondary | ICD-10-CM | POA: Insufficient documentation

## 2012-04-27 MED ORDER — HYDROCODONE-ACETAMINOPHEN 5-325 MG PO TABS: 1.0000 | ORAL_TABLET | ORAL | Status: DC | PRN

## 2012-04-27 MED ORDER — ONDANSETRON HCL 8 MG PO TABS: 8.0000 mg | ORAL_TABLET | Freq: Three times a day (TID) | ORAL | Status: DC | PRN

## 2012-04-27 MED ORDER — ONDANSETRON 4 MG PO TBDP
ORAL_TABLET | ORAL | Status: AC
  Filled 2012-04-27: qty 1

## 2012-04-27 MED ORDER — ONDANSETRON 4 MG PO TBDP
4.0000 mg | ORAL_TABLET | Freq: Once | ORAL | Status: AC
  Administered 2012-04-27: 4 mg via ORAL

## 2012-04-27 NOTE — ED Notes (Signed)
[id number]<AV> Expected date:04/27/12<BR> Expected time: 2:10 AM<BR> Means of arrival:Ambulance<BR> Comments:<BR> Lower abd pain

## 2012-04-27 NOTE — ED Notes (Signed)
Pt states she woke w/ bad pain the "came on me", vomited at home x 2, now is having diarrhea since arrival to ED

## 2012-04-27 NOTE — ED Notes (Signed)
Per EMS - Pt reports being woke from sleep w/ lower abdominal pain and rectal pressure. Last normal BM was 3 days ago. Unable to have BM prior to arrival of EMS. Emesis at home before coming to ED. BP - 138/98, HR - 90, Resp - 20,

## 2012-04-27 NOTE — ED Provider Notes (Signed)
History     CSN: [id number]  Arrival date & time 04/27/12  0228   First MD Initiated Contact with Patient 04/27/12 0249      Chief Complaint  Patient presents with  . Abdominal Pain    (Consider location/radiation/quality/duration/timing/severity/associated sxs/prior treatment) HPI History provided by pt.   Pt reports waking at 2am w/ severe, sharp pain in center of abdomen.  Felt normal before going to bed.  Pain currently improved.  Associated w/ N/V/D.  Denies fever, hematemesis/hematochezia/melena, urinary and vaginal sx.  Did not eat anything out of the ordinary last night.  No new medications.  No known sick contacts.  History reviewed. No pertinent past medical history.  Past Surgical History  Procedure Date  . Cesarean section     No family history on file.  History  Substance Use Topics  . Smoking status: Former Games developer  . Smokeless tobacco: Never Used  . Alcohol Use: No    OB History    Grav Para Term Preterm Abortions TAB SAB Ect Mult Living                  Review of Systems  All other systems reviewed and are negative.    Allergies  Penicillins  Home Medications   Current Outpatient Rx  Name  Route  Sig  Dispense  Refill  . HYDROCODONE-ACETAMINOPHEN 5-325 MG PO TABS   Oral   Take 1 tablet by mouth every 4 (four) hours as needed. For pain         . PREDNISONE 20 MG PO TABS   Oral   Take 1 tablet (20 mg total) by mouth 2 (two) times daily.   10 tablet   0   . HYDROCODONE-ACETAMINOPHEN 5-325 MG PO TABS   Oral   Take 1 tablet by mouth every 4 (four) hours as needed for pain.   12 tablet   0   . ONDANSETRON HCL 8 MG PO TABS   Oral   Take 1 tablet (8 mg total) by mouth every 8 (eight) hours as needed for nausea.   20 tablet   0     BP 122/57  Pulse 78  Temp 98.1 F (36.7 C) (Oral)  Resp 16  SpO2 100%  LMP 04/25/2012  Physical Exam  Nursing note and vitals reviewed. Constitutional: She is oriented to person, place, and time.  She appears well-developed and well-nourished. No distress.  HENT:  Head: Normocephalic and atraumatic.  Mouth/Throat: Oropharynx is clear and moist.  Eyes:       Normal appearance  Neck: Normal range of motion.  Cardiovascular: Normal rate and regular rhythm.   Pulmonary/Chest: Effort normal and breath sounds normal. No respiratory distress.  Abdominal: Soft. Bowel sounds are normal. She exhibits no distension and no mass. There is no tenderness. There is no rebound and no guarding.       obese  Genitourinary:       No CVA tenderness  Musculoskeletal: Normal range of motion.  Neurological: She is alert and oriented to person, place, and time.  Skin: Skin is warm and dry. No rash noted.  Psychiatric: She has a normal mood and affect. Her behavior is normal.    ED Course  Procedures (including critical care time)  Labs Reviewed - No data to display No results found.   1. Gastroenteritis       MDM  22yo healthy F presents w/ abdominal pain, N/V/D.  Pain currently improved.  On exam, afebrile, NAD, well-hydrated,  abdomen benign.  Pt received po zofran and nausea resolved.  Tolerating pos.  Abd continues to be non-tender on repeat exam and VSS.  D/c'd home w/ zofran and 12 vicodin.  Return precautions discussed.  4:53 AM         Otilio Miu, PA-C 04/27/12 [phone]

## 2012-04-27 NOTE — ED Provider Notes (Signed)
Medical screening examination/treatment/procedure(s) were performed by non-physician practitioner and as supervising physician I was immediately available for consultation/collaboration.   Sunnie Nielsen, MD 04/27/12 [phone]

## 2012-04-27 NOTE — ED Notes (Signed)
Discharge instructions reviewed w/ pt., verbalizes understanding. Two prescriptions provided at discharge. 

## 2012-06-01 ENCOUNTER — Encounter (HOSPITAL_BASED_OUTPATIENT_CLINIC_OR_DEPARTMENT_OTHER): Payer: Self-pay | Admitting: *Deleted

## 2012-06-03 ENCOUNTER — Encounter (HOSPITAL_BASED_OUTPATIENT_CLINIC_OR_DEPARTMENT_OTHER): Payer: Self-pay

## 2012-06-03 ENCOUNTER — Encounter (HOSPITAL_BASED_OUTPATIENT_CLINIC_OR_DEPARTMENT_OTHER): Payer: Self-pay | Admitting: Anesthesiology

## 2012-06-03 ENCOUNTER — Ambulatory Visit (HOSPITAL_BASED_OUTPATIENT_CLINIC_OR_DEPARTMENT_OTHER)
Admission: RE | Admit: 2012-06-03 | Discharge: 2012-06-03 | Disposition: A | Discharge status: Home / Self Care | Payer: Medicaid Other | Source: Ambulatory Visit | Attending: Otolaryngology | Admitting: Otolaryngology

## 2012-06-03 ENCOUNTER — Encounter (HOSPITAL_BASED_OUTPATIENT_CLINIC_OR_DEPARTMENT_OTHER): Payer: Self-pay | Admitting: Otolaryngology

## 2012-06-03 ENCOUNTER — Encounter (HOSPITAL_BASED_OUTPATIENT_CLINIC_OR_DEPARTMENT_OTHER)
Admission: RE | Disposition: A | Discharge status: Home / Self Care | Payer: Self-pay | Source: Ambulatory Visit | Attending: Otolaryngology

## 2012-06-03 ENCOUNTER — Ambulatory Visit (HOSPITAL_BASED_OUTPATIENT_CLINIC_OR_DEPARTMENT_OTHER): Payer: Medicaid Other | Admitting: Anesthesiology

## 2012-06-03 DIAGNOSIS — R221 Localized swelling, mass and lump, neck: Secondary | ICD-10-CM | POA: Insufficient documentation

## 2012-06-03 DIAGNOSIS — R220 Localized swelling, mass and lump, head: Secondary | ICD-10-CM | POA: Insufficient documentation

## 2012-06-03 DIAGNOSIS — J358 Other chronic diseases of tonsils and adenoids: Secondary | ICD-10-CM

## 2012-06-03 DIAGNOSIS — J3501 Chronic tonsillitis: Secondary | ICD-10-CM | POA: Insufficient documentation

## 2012-06-03 HISTORY — DX: Anxiety disorder, unspecified: F41.9

## 2012-06-03 HISTORY — PX: MASS EXCISION: SHX2000

## 2012-06-03 HISTORY — DX: Mental disorder, not otherwise specified: F99

## 2012-06-03 HISTORY — PX: TONSILLECTOMY: SHX5217

## 2012-06-03 LAB — POCT HEMOGLOBIN-HEMACUE: Hemoglobin: 12.2 g/dL (ref 12.0–15.0)

## 2012-06-03 SURGERY — TONSILLECTOMY
Anesthesia: General | Site: Throat | Wound class: Clean Contaminated

## 2012-06-03 MED ORDER — LABETALOL HCL 5 MG/ML IV SOLN
INTRAVENOUS | Status: DC | PRN
  Administered 2012-06-03: 5 mg via INTRAVENOUS

## 2012-06-03 MED ORDER — DEXAMETHASONE SODIUM PHOSPHATE 10 MG/ML IJ SOLN: 10.0000 mg | Freq: Once | INTRAMUSCULAR | Status: AC

## 2012-06-03 MED ORDER — OXYCODONE HCL 5 MG PO TABS: 5.0000 mg | ORAL_TABLET | Freq: Once | ORAL | Status: DC | PRN

## 2012-06-03 MED ORDER — SUCCINYLCHOLINE CHLORIDE 20 MG/ML IJ SOLN
INTRAMUSCULAR | Status: DC | PRN
  Administered 2012-06-03: 100 mg via INTRAVENOUS

## 2012-06-03 MED ORDER — FENTANYL CITRATE 0.05 MG/ML IJ SOLN: 50.0000 ug | INTRAMUSCULAR | Status: DC | PRN

## 2012-06-03 MED ORDER — OXYCODONE HCL 5 MG/5ML PO SOLN: 5.0000 mg | Freq: Once | ORAL | Status: DC | PRN

## 2012-06-03 MED ORDER — HYDROCODONE-ACETAMINOPHEN 7.5-500 MG/15ML PO SOLN: ORAL | Status: DC

## 2012-06-03 MED ORDER — DEXAMETHASONE SODIUM PHOSPHATE 4 MG/ML IJ SOLN
INTRAMUSCULAR | Status: DC | PRN
  Administered 2012-06-03: 10 mg via INTRAVENOUS

## 2012-06-03 MED ORDER — HYDROCODONE-ACETAMINOPHEN 7.5-325 MG/15ML PO SOLN
10.0000 mL | ORAL | Status: DC | PRN
  Administered 2012-06-03 (×3): 15 mL via ORAL

## 2012-06-03 MED ORDER — HYDROMORPHONE HCL PF 1 MG/ML IJ SOLN
0.2500 mg | INTRAMUSCULAR | Status: DC | PRN
  Administered 2012-06-03 (×2): 0.5 mg via INTRAVENOUS

## 2012-06-03 MED ORDER — MIDAZOLAM HCL 5 MG/5ML IJ SOLN
INTRAMUSCULAR | Status: DC | PRN
  Administered 2012-06-03: 2 mg via INTRAVENOUS

## 2012-06-03 MED ORDER — ONDANSETRON HCL 4 MG PO TABS: 4.0000 mg | ORAL_TABLET | ORAL | Status: DC | PRN

## 2012-06-03 MED ORDER — DEXAMETHASONE SODIUM PHOSPHATE 10 MG/ML IJ SOLN
10.0000 mg | Freq: Once | INTRAMUSCULAR | Status: AC
  Administered 2012-06-03: 10 mg via INTRAVENOUS

## 2012-06-03 MED ORDER — PROMETHAZINE HCL 25 MG/ML IJ SOLN
6.2500 mg | INTRAMUSCULAR | Status: DC | PRN
  Administered 2012-06-03: 12.5 mg via INTRAVENOUS

## 2012-06-03 MED ORDER — CIPROFLOXACIN IN D5W 400 MG/200ML IV SOLN
400.0000 mg | Freq: Once | INTRAVENOUS | Status: AC
  Administered 2012-06-03: 400 mg via INTRAVENOUS

## 2012-06-03 MED ORDER — FENTANYL CITRATE 0.05 MG/ML IJ SOLN
INTRAMUSCULAR | Status: DC | PRN
  Administered 2012-06-03: 50 ug via INTRAVENOUS
  Administered 2012-06-03: 100 ug via INTRAVENOUS

## 2012-06-03 MED ORDER — CLINDAMYCIN HCL 300 MG PO CAPS: 300.0000 mg | ORAL_CAPSULE | Freq: Three times a day (TID) | ORAL | Status: DC

## 2012-06-03 MED ORDER — ONDANSETRON HCL 4 MG/2ML IJ SOLN
4.0000 mg | INTRAMUSCULAR | Status: DC | PRN
  Administered 2012-06-03 (×2): 4 mg via INTRAVENOUS

## 2012-06-03 MED ORDER — BACITRACIN-NEOMYCIN-POLYMYXIN 400-5-5000 EX OINT
TOPICAL_OINTMENT | CUTANEOUS | Status: DC | PRN
  Administered 2012-06-03: 1 via TOPICAL

## 2012-06-03 MED ORDER — ACETAMINOPHEN 160 MG/5ML PO SOLN: 650.0000 mg | ORAL | Status: DC | PRN

## 2012-06-03 MED ORDER — PHENOL 1.4 % MT LIQD: 1.0000 | OROMUCOSAL | Status: DC | PRN

## 2012-06-03 MED ORDER — DEXTROSE IN LACTATED RINGERS 5 % IV SOLN
INTRAVENOUS | Status: DC
  Administered 2012-06-03: 11:00:00 via INTRAVENOUS

## 2012-06-03 MED ORDER — MIDAZOLAM HCL 2 MG/2ML IJ SOLN: 1.0000 mg | INTRAMUSCULAR | Status: DC | PRN

## 2012-06-03 MED ORDER — PROPOFOL 10 MG/ML IV BOLUS
INTRAVENOUS | Status: DC | PRN
  Administered 2012-06-03: 200 mg via INTRAVENOUS
  Administered 2012-06-03: 50 mg via INTRAVENOUS

## 2012-06-03 MED ORDER — MEPERIDINE HCL 25 MG/ML IJ SOLN: 6.2500 mg | INTRAMUSCULAR | Status: DC | PRN

## 2012-06-03 MED ORDER — ACETAMINOPHEN 650 MG RE SUPP: 650.0000 mg | RECTAL | Status: DC | PRN

## 2012-06-03 MED ORDER — LACTATED RINGERS IV SOLN
INTRAVENOUS | Status: DC
  Administered 2012-06-03: 07:00:00 via INTRAVENOUS

## 2012-06-03 MED ORDER — MORPHINE SULFATE 2 MG/ML IJ SOLN
2.0000 mg | INTRAMUSCULAR | Status: DC | PRN
  Administered 2012-06-03: 2 mg via INTRAVENOUS

## 2012-06-03 SURGICAL SUPPLY — 36 items
CANISTER SUCTION 1200CC (MISCELLANEOUS) ×2 IMPLANT
CATH ROBINSON RED A/P 10FR (CATHETERS) IMPLANT
CLEANER CAUTERY TIP 5X5 PAD (MISCELLANEOUS) IMPLANT
CLOTH BEACON ORANGE TIMEOUT ST (SAFETY) ×2 IMPLANT
COAGULATOR SUCT SWTCH 10FR 6 (ELECTROSURGICAL) IMPLANT
COVER MAYO STAND STRL (DRAPES) ×2 IMPLANT
ELECT COATED BLADE 2.86 ST (ELECTRODE) ×2 IMPLANT
ELECT REM PT RETURN 9FT ADLT (ELECTROSURGICAL) ×2
ELECT REM PT RETURN 9FT PED (ELECTROSURGICAL)
ELECTRODE REM PT RETRN 9FT PED (ELECTROSURGICAL) IMPLANT
ELECTRODE REM PT RTRN 9FT ADLT (ELECTROSURGICAL) ×1 IMPLANT
GAUZE SPONGE 4X4 12PLY STRL LF (GAUZE/BANDAGES/DRESSINGS) ×2 IMPLANT
GLOVE BIOGEL M 7.0 STRL (GLOVE) ×2 IMPLANT
GLOVE BIOGEL PI IND STRL 7.0 (GLOVE) ×1 IMPLANT
GLOVE BIOGEL PI INDICATOR 7.0 (GLOVE) ×1
GLOVE ECLIPSE 6.5 STRL STRAW (GLOVE) ×4 IMPLANT
GLOVE SKINSENSE NS SZ7.0 (GLOVE) ×1
GLOVE SKINSENSE STRL SZ7.0 (GLOVE) ×1 IMPLANT
GOWN PREVENTION PLUS XLARGE (GOWN DISPOSABLE) ×6 IMPLANT
GOWN PREVENTION PLUS XXLARGE (GOWN DISPOSABLE) IMPLANT
MARKER SKIN DUAL TIP RULER LAB (MISCELLANEOUS) IMPLANT
NS IRRIG 1000ML POUR BTL (IV SOLUTION) ×2 IMPLANT
PAD CLEANER CAUTERY TIP 5X5 (MISCELLANEOUS)
PENCIL BUTTON HOLSTER BLD 10FT (ELECTRODE) ×2 IMPLANT
PIN SAFETY STERILE (MISCELLANEOUS) ×2 IMPLANT
SHEET MEDIUM DRAPE 40X70 STRL (DRAPES) ×2 IMPLANT
SOLUTION BUTLER CLEAR DIP (MISCELLANEOUS) IMPLANT
SPONGE TONSIL 1 RF SGL (DISPOSABLE) IMPLANT
SPONGE TONSIL 1.25 RF SGL STRG (GAUZE/BANDAGES/DRESSINGS) IMPLANT
SYR BULB 3OZ (MISCELLANEOUS) ×2 IMPLANT
TOWEL OR 17X24 6PK STRL BLUE (TOWEL DISPOSABLE) ×2 IMPLANT
TUBE CONNECTING 20X1/4 (TUBING) ×2 IMPLANT
TUBE SALEM SUMP 12R W/ARV (TUBING) IMPLANT
TUBE SALEM SUMP 16 FR W/ARV (TUBING) ×2 IMPLANT
WATER STERILE IRR 1000ML POUR (IV SOLUTION) ×2 IMPLANT
YANKAUER SUCT BULB TIP NO VENT (SUCTIONS) ×2 IMPLANT

## 2012-06-03 NOTE — Op Note (Signed)
[name], [name]                ACCOUNT NO.:  [id number]  MEDICAL RECORD NO.:  [id number]  LOCATION:                                 FACILITY:  PHYSICIAN:  Kinnie Scales. Annalee Genta, M.D.DATE OF BIRTH:  1990/07/05  DATE OF PROCEDURE:  06/03/2012 DATE OF DISCHARGE:                              OPERATIVE REPORT   PREOPERATIVE DIAGNOSIS: 1. Recurrent acute tonsillitis. 2. Chronic cryptic tonsillitis. 3. 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  POSTOPERATIVE DIAGNOSIS: 1. Recurrent acute tonsillitis. 2. Chronic cryptic tonsillitis. 3. 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  INDICATION FOR SURGERY:  Chronic tonsillitis.  PROCEDURES:  Tonsillectomy.  ANESTHESIA:  General endotracheal.  COMPLICATIONS:  No complications.  BLOOD LOSS:  Minimal.  The patient was transferred from the operating room to recovery room in stable condition.  BRIEF HISTORY:  The patient is a 22 year old, black female, who is referred to our office for evaluation of recurrent acute tonsillitis and chronic cryptic tonsillitis.  She has had a history of chronic tonsil discharge, sore throat, and halitosis, in addition to recurrent infections.  Given her history and examination which showed 3+ cryptic tonsils, I recommended tonsillectomy under general anesthesia.  The risks and benefits of the procedure were discussed in detail with the patient who understood and concurred with our plan for surgery to schedule as an outpatient under general anesthesia at the [facility].  PROCEDURE IN DETAIL:  The patient was brought to the operating room on June 03, 2012, placed supine position on the operating table. General endotracheal anesthesia was established without difficulty. When the patient adequately anesthetized, a Crowe-Davis mouth gag was inserted without difficulty.  The patient's oral cavity and oropharynx were examined.  There was no adenoidal  tissue in the nasopharynx. Adenoidectomy was then performed using Bovie electrocautery set at 35 watts.  The entire left tonsil was removed by dissecting in subcapsular fashion removing the tonsil from superior pole to tongue base.  The right tonsil was removed in a similar fashion and the tonsil tissue was sent to pathology for gross microscopic evaluation.  An incidental finding was a small papillomatous mass on the tip of the uvula which had not been found on previous examination.  This was also resected and was sent to pathology for gross and microscopic evaluation.  No evidence of other ulcer, mass, or lesion.  The Crowe-Davis mouth gag was then released and reapplied.  There was no active bleeding.  Dry tonsil sponge was used to gently abrade the tonsillar fossa and several small areas of point hemorrhage were then cauterized.  An orogastric tube was passed.  The stomach contents were aspirated.  The mouth gag was released and removed, again no loose or broken teeth and there was no active bleeding.  The patient was then awakened and was extubated and transferred from the operating room to the recovery room in stable condition.  No complications and minimal blood loss.          ______________________________ Kinnie Scales Annalee Genta, M.D.     DLS/MEDQ  D:  19/14/7829  T:  06/03/2012  Job:  [id number]

## 2012-06-03 NOTE — Brief Op Note (Signed)
06/03/2012  8:57 AM  PATIENT:  [name]  22 y.o. female  PRE-OPERATIVE DIAGNOSIS:  CHRONIC TONSILITIS  POST-OPERATIVE DIAGNOSIS:  CHRONIC TONSILITIS     UVULAR SOFT TISSUE MASS  PROCEDURE:  Procedure(s) (LRB) with comments: TONSILLECTOMY (N/A) EXC UVULAR MASS   SURGEON:  Surgeon(s) and Role:    * Osborn Coho, MD - Primary  PHYSICIAN ASSISTANT:   ASSISTANTS: none   ANESTHESIA:   general  EBL:   Min  BLOOD ADMINISTERED:none  DRAINS: none   LOCAL MEDICATIONS USED:  NONE  SPECIMEN:  Source of Specimen:  tonsils and uvula  DISPOSITION OF SPECIMEN:  PATHOLOGY  COUNTS:  YES  TOURNIQUET:  * No tourniquets in log *  DICTATION: .Other Dictation: Dictation Number [id number]  PLAN OF CARE: Discharge to home after PACU  PATIENT DISPOSITION:  PACU - hemodynamically stable.   Delay start of Pharmacological VTE agent (>24hrs) due to surgical blood loss or risk of bleeding: not applicable

## 2012-06-03 NOTE — Anesthesia Postprocedure Evaluation (Signed)
  Anesthesia Post-op Note  Patient: [name]  Procedure(s) Performed: Procedure(s) (LRB) with comments: TONSILLECTOMY (N/A) EXCISION MASS (N/A) - Excision uvula mass  Patient Location: PACU  Anesthesia Type:General  Level of Consciousness: awake and sedated  Airway and Oxygen Therapy: Patient Spontanous Breathing  Post-op Pain: mild  Post-op Assessment: Post-op Vital signs reviewed  Post-op Vital Signs: stable  Complications: No apparent anesthesia complications

## 2012-06-03 NOTE — H&P (Signed)
[name] is an 22 y.o. female.   Chief Complaint: Chronic Tonsillitis  HPI: chronic Sore throat and tonsil d/c  Past Medical History  Diagnosis Date  . Anxiety   . Mental disorder   . Chronic tonsillitis     Past Surgical History  Procedure Date  . Cesarean section     History reviewed. No pertinent family history. Social History:  reports that she quit smoking about 8 months ago. She has never used smokeless tobacco. She reports that she does not drink alcohol or use illicit drugs.  Allergies:  Allergies  Allergen Reactions  . Penicillins Hives    Face breaks out.    No prescriptions prior to admission    No results found for this or any previous visit (from the past 48 hour(s)). No results found.  Review of Systems  Constitutional: Negative.   Respiratory: Negative.   Cardiovascular: Negative.   Musculoskeletal: Negative.   Skin: Negative.     Blood pressure 112/79, pulse 80, temperature 97.7 F (36.5 C), temperature source Oral, resp. rate 20, height 5\' 6"  (1.676 m), weight 100.426 kg (221 lb 6.4 oz), last menstrual period 05/19/2012, SpO2 99.00%. Physical Exam  Constitutional: She is oriented to person, place, and time. She appears well-developed and well-nourished.  Neck: Normal range of motion. Neck supple.  Cardiovascular: Normal rate.   Respiratory: Effort normal.  GI: Soft.  Musculoskeletal: Normal range of motion.  Neurological: She is alert and oriented to person, place, and time.     Assessment/Plan Adm for OP Tonsillectomy  [name] 06/03/2012, 8:09 AM

## 2012-06-03 NOTE — Transfer of Care (Signed)
Immediate Anesthesia Transfer of Care Note  Patient: [name]  Procedure(s) Performed: Procedure(s) (LRB) with comments: TONSILLECTOMY (N/A)  Patient Location: PACU  Anesthesia Type:General  Level of Consciousness: sedated and patient cooperative  Airway & Oxygen Therapy: Patient Spontanous Breathing and aerosol face mask  Post-op Assessment: Report given to PACU RN and Post -op Vital signs reviewed and stable  Post vital signs: Reviewed and stable  Complications: No apparent anesthesia complications

## 2012-06-03 NOTE — Anesthesia Procedure Notes (Signed)
Procedure Name: Intubation Date/Time: 06/03/2012 8:27 AM Performed by: Gar Gibbon Pre-anesthesia Checklist: Patient identified, Emergency Drugs available, Suction available and Patient being monitored Patient Re-evaluated:Patient Re-evaluated prior to inductionOxygen Delivery Method: Circle System Utilized Preoxygenation: Pre-oxygenation with 100% oxygen Intubation Type: IV induction Ventilation: Mask ventilation without difficulty Laryngoscope Size: Mac and 3 Grade View: Grade I Tube type: Oral Number of attempts: 1 Airway Equipment and Method: stylet and oral airway Placement Confirmation: ETT inserted through vocal cords under direct vision,  positive ETCO2 and breath sounds checked- equal and bilateral Secured at: 22 cm Tube secured with: Tape Dental Injury: Teeth and Oropharynx as per pre-operative assessment

## 2012-06-03 NOTE — Anesthesia Preprocedure Evaluation (Signed)
Anesthesia Evaluation  Patient identified by MRN, date of birth, ID band Patient awake  General Assessment Comment:Obese body habitus  Reviewed: Allergy & Precautions, H&P , NPO status , Patient's Chart, lab work & pertinent test results  History of Anesthesia Complications Negative for: history of anesthetic complications  Airway Mallampati: II  Neck ROM: full    Dental No notable dental hx.    Pulmonary neg pulmonary ROS,    Pulmonary exam normal       Cardiovascular negative cardio ROS  IRhythm:regular Rate:Normal     Neuro/Psych negative neurological ROS  negative psych ROS   GI/Hepatic negative GI ROS, Neg liver ROS,   Endo/Other  negative endocrine ROS  Renal/GU negative Renal ROS  negative genitourinary   Musculoskeletal   Abdominal   Peds  Hematology   Anesthesia Other Findings   Reproductive/Obstetrics negative OB ROS                           Anesthesia Physical Anesthesia Plan  ASA: II  Anesthesia Plan: General and General ETT   Post-op Pain Management:    Induction:   Airway Management Planned:   Additional Equipment:   Intra-op Plan:   Post-operative Plan:   Informed Consent: I have reviewed the patients History and Physical, chart, labs and discussed the procedure including the risks, benefits and alternatives for the proposed anesthesia with the patient or authorized representative who has indicated his/her understanding and acceptance.     Plan Discussed with: CRNA and Surgeon  Anesthesia Plan Comments:         Anesthesia Quick Evaluation

## 2012-06-06 ENCOUNTER — Encounter (HOSPITAL_BASED_OUTPATIENT_CLINIC_OR_DEPARTMENT_OTHER): Payer: Self-pay | Admitting: Otolaryngology

## 2012-06-27 ENCOUNTER — Encounter (HOSPITAL_COMMUNITY): Payer: Self-pay | Admitting: Emergency Medicine

## 2012-06-27 ENCOUNTER — Emergency Department (HOSPITAL_COMMUNITY)
Admission: EM | Admit: 2012-06-27 | Discharge: 2012-06-28 | Disposition: A | Discharge status: Other Acute Inpatient Hospital | Payer: Medicaid Other | Attending: Emergency Medicine | Admitting: Emergency Medicine

## 2012-06-27 DIAGNOSIS — F3289 Other specified depressive episodes: Secondary | ICD-10-CM | POA: Insufficient documentation

## 2012-06-27 DIAGNOSIS — F32A Depression, unspecified: Secondary | ICD-10-CM

## 2012-06-27 DIAGNOSIS — Z3202 Encounter for pregnancy test, result negative: Secondary | ICD-10-CM | POA: Insufficient documentation

## 2012-06-27 DIAGNOSIS — Z8659 Personal history of other mental and behavioral disorders: Secondary | ICD-10-CM | POA: Insufficient documentation

## 2012-06-27 DIAGNOSIS — R45851 Suicidal ideations: Secondary | ICD-10-CM | POA: Insufficient documentation

## 2012-06-27 DIAGNOSIS — F329 Major depressive disorder, single episode, unspecified: Secondary | ICD-10-CM | POA: Insufficient documentation

## 2012-06-27 DIAGNOSIS — Z87891 Personal history of nicotine dependence: Secondary | ICD-10-CM | POA: Insufficient documentation

## 2012-06-27 LAB — RAPID URINE DRUG SCREEN, HOSP PERFORMED
Amphetamines: NOT DETECTED
Benzodiazepines: NOT DETECTED
Cocaine: NOT DETECTED
Opiates: NOT DETECTED

## 2012-06-27 LAB — COMPREHENSIVE METABOLIC PANEL
ALT: 15 U/L (ref 0–35)
AST: 16 U/L (ref 0–37)
Albumin: 3.7 g/dL (ref 3.5–5.2)
Alkaline Phosphatase: 41 U/L (ref 39–117)
Calcium: 9 mg/dL (ref 8.4–10.5)
Potassium: 3.6 mEq/L (ref 3.5–5.1)
Sodium: 138 mEq/L (ref 135–145)
Total Protein: 7.1 g/dL (ref 6.0–8.3)

## 2012-06-27 LAB — CBC
MCH: 26.4 pg (ref 26.0–34.0)
MCHC: 32.6 g/dL (ref 30.0–36.0)
Platelets: 255 10*3/uL (ref 150–400)

## 2012-06-27 LAB — SALICYLATE LEVEL: Salicylate Lvl: <2 mg/dL — ABNORMAL LOW (ref 2.8–20.0)

## 2012-06-27 MED ORDER — ONDANSETRON HCL 4 MG PO TABS: 4.0000 mg | ORAL_TABLET | Freq: Three times a day (TID) | ORAL | Status: DC | PRN

## 2012-06-27 MED ORDER — ACETAMINOPHEN 325 MG PO TABS: 650.0000 mg | ORAL_TABLET | ORAL | Status: DC | PRN

## 2012-06-27 MED ORDER — LORAZEPAM 1 MG PO TABS: 1.0000 mg | ORAL_TABLET | Freq: Three times a day (TID) | ORAL | Status: DC | PRN

## 2012-06-27 MED ORDER — ZOLPIDEM TARTRATE 5 MG PO TABS: 5.0000 mg | ORAL_TABLET | Freq: Every evening | ORAL | Status: DC | PRN

## 2012-06-27 NOTE — ED Notes (Signed)
Pt transferred from triage, presents with SI, denies HI. Plan to take pills,  Pt reports she found out her boyfriend is cheating on her.Pt reports hx of depression & anxiety.  Denies AV hallucinations,  Not feeling hopeless. Pt calm &cooperative at present.

## 2012-06-27 NOTE — ED Notes (Signed)
Per EMS--found out her boyfriend was cheating on her she took 1 allergy pill, attempted to cut her wrist there is a small indentation on her right wrist.

## 2012-06-27 NOTE — ED Notes (Signed)
Pt changed into blue scrubs and wanded by security along with 2 belongings bags

## 2012-06-27 NOTE — ED Provider Notes (Signed)
Medical screening examination/treatment/procedure(s) were performed by non-physician practitioner and as supervising physician I was immediately available for consultation/collaboration.   Kevin M Campos, MD 06/27/12 2046 

## 2012-06-27 NOTE — ED Provider Notes (Signed)
History    This chart was scribed for non-physician practitioner working with Lyanne Co, MD by Charolett Bumpers, ED Scribe. This patient was seen in room [facility]TR4/[facility]PT4 and the patient's care was started at 1911.   CSN: [id number]  Arrival date & time 06/27/12  1854   First MD Initiated Contact with Patient 06/27/12 1911      No chief complaint on file.    The history is provided by the patient. No language interpreter was used.   [name] is a 22 y.o. female who has a h/o anxiety and bipolar disorder, presents to the Emergency Department complaining of persistent, suicidal ideations that started today. She states that she has been depressed after finding out her boyfriend is cheating on her this week. She states that she took 1 allergy pill in attempt to hurt herself and tried cutting her left arm. She reports a h/o SI in the past. She states she is suppose to be on medications for depression but does not take any medications currently. She denies any hallucinations or HI. She denies any physical complaints. She denies any h/o substance abuse.    Past Medical History  Diagnosis Date  . Anxiety   . Mental disorder   . Chronic tonsillitis     Past Surgical History  Procedure Laterality Date  . Cesarean section    . Tonsillectomy N/A 06/03/2012    Procedure: TONSILLECTOMY;  Surgeon: Osborn Coho, MD;  Location: [facility];  Service: ENT;  Laterality: N/A;  . Mass excision N/A 06/03/2012    Procedure: EXCISION MASS;  Surgeon: Osborn Coho, MD;  Location: [facility];  Service: ENT;  Laterality: N/A;  Excision uvula mass    No family history on file.  History  Substance Use Topics  . Smoking status: Former Smoker    Quit date: 09/30/2011  . Smokeless tobacco: Never Used  . Alcohol Use: No    OB History   Grav Para Term Preterm Abortions TAB SAB Ect Mult Living                  Review of Systems A complete 10 system review of  systems was obtained and all systems are negative except as noted in the HPI and PMH.   Allergies  Penicillins  Home Medications  No current outpatient prescriptions on file.  BP 128/93  Pulse 90  Temp(Src) 98.5 F (36.9 C) (Oral)  Resp 19  SpO2 100%  LMP 05/19/2012  Physical Exam  Nursing note and vitals reviewed. Constitutional: She is oriented to person, place, and time. She appears well-developed and well-nourished. No distress.  HENT:  Head: Normocephalic and atraumatic.  Eyes: Conjunctivae and EOM are normal.  Neck: Neck supple. No tracheal deviation present.  Cardiovascular: Normal rate, regular rhythm and normal heart sounds.   Pulmonary/Chest: Effort normal and breath sounds normal. No respiratory distress. She has no wheezes.  Abdominal: Soft. There is no tenderness.  Musculoskeletal: Normal range of motion.  Neurological: She is alert and oriented to person, place, and time.  Skin: Skin is warm and dry.  Superficial 1 cm abrasion to the left forearm.   Psychiatric: She has a normal mood and affect. Her behavior is normal.    ED Course  Procedures (including critical care time)  DIAGNOSTIC STUDIES: Oxygen Saturation is 100% on room air, normal by my interpretation.    COORDINATION OF CARE:  19:30-Discussed planned course of treatment with the patient including medical clearance  prior to evaluation with behavioral health, who is agreeable at this time.     Labs Reviewed - No data to display No results found.   No diagnosis found.  1. SI 2. Depression 3. H/o of bipolar  MDM  Patient reports SI with plan to overdose on her allergy pills. History of attempt in the past. [facility] to evaluate.    I personally performed the services described in this documentation, which was scribed in my presence. The recorded information has been reviewed and is accurate.         Arnoldo Hooker, PA-C 06/27/12 1953

## 2012-06-27 NOTE — ED Notes (Signed)
Pt reports recent depression, has been medicated for this in the past however unable to get her medications for this. Pt upset with her boyfriend today, pt admits to thoughts of SI w/ plan to OD on allergy medications however she only had one available. Pt states she also tried to cut her wrist, very small <1cm healing superficial scratch to left forearm, no dressing indicated at this time.

## 2012-06-27 NOTE — ED Notes (Signed)
Pt BIB [facility]. Pt told [facility] that she has been off her medications for the past year. Pt has suicidal thoughts with plan to overdose on her meds. Pt took one Claritin med PTA per [facility]. Pt called [facility] to take her to hospital. Pt is here voluntarily per [facility].

## 2012-06-28 ENCOUNTER — Encounter (HOSPITAL_COMMUNITY): Payer: Self-pay | Admitting: *Deleted

## 2012-06-28 ENCOUNTER — Inpatient Hospital Stay (HOSPITAL_COMMUNITY)
Admission: EM | Admit: 2012-06-28 | Discharge: 2012-07-04 | DRG: 885 | Disposition: A | Discharge status: Home / Self Care | Payer: Medicaid Other | Source: Intra-hospital | Attending: Psychiatry | Admitting: Psychiatry

## 2012-06-28 DIAGNOSIS — F411 Generalized anxiety disorder: Secondary | ICD-10-CM | POA: Diagnosis present

## 2012-06-28 DIAGNOSIS — J029 Acute pharyngitis, unspecified: Secondary | ICD-10-CM

## 2012-06-28 DIAGNOSIS — F332 Major depressive disorder, recurrent severe without psychotic features: Principal | ICD-10-CM | POA: Diagnosis present

## 2012-06-28 DIAGNOSIS — F3130 Bipolar disorder, current episode depressed, mild or moderate severity, unspecified: Secondary | ICD-10-CM | POA: Diagnosis present

## 2012-06-28 DIAGNOSIS — Z79899 Other long term (current) drug therapy: Secondary | ICD-10-CM

## 2012-06-28 MED ORDER — ESCITALOPRAM OXALATE 10 MG PO TABS
10.0000 mg | ORAL_TABLET | Freq: Every day | ORAL | Status: DC
  Administered 2012-06-28: 10 mg via ORAL
  Filled 2012-06-28: qty 1

## 2012-06-28 MED ORDER — ALUM & MAG HYDROXIDE-SIMETH 200-200-20 MG/5ML PO SUSP: 30.0000 mL | ORAL | Status: DC | PRN

## 2012-06-28 MED ORDER — TRAZODONE HCL 100 MG PO TABS
100.0000 mg | ORAL_TABLET | Freq: Every day | ORAL | Status: DC
  Filled 2012-06-28 (×9): qty 1

## 2012-06-28 MED ORDER — MAGNESIUM HYDROXIDE 400 MG/5ML PO SUSP: 30.0000 mL | Freq: Every day | ORAL | Status: DC | PRN

## 2012-06-28 MED ORDER — ACETAMINOPHEN 325 MG PO TABS
650.0000 mg | ORAL_TABLET | Freq: Four times a day (QID) | ORAL | Status: DC | PRN
  Administered 2012-07-03: 650 mg via ORAL

## 2012-06-28 NOTE — H&P (Signed)
Psychiatric Admission Assessment Adult  Patient Identification:  [name] Date of Evaluation:  06/28/2012 Chief Complaint:  MDD History of Present Illness:  Depression "on and off", she found out that her boyfriend was cheating on her and arguing with her on her phone in front of her, "pissed me off, still calling my phone, telling me he still loves me."  She got upset, started crying and shaking--a phone call yesterday set her off yesterday and she decided she was going to kill herself, took an allergy pill, got nervous and called the police who told her not to overdose.  Hinley has had her Invega about 3.5 weeks ago, wants it once a month, [facility] scheduled for twice month  Associated Signs/Synptoms: Depression Symptoms:  depressed mood, anhedonia, fatigue, feelings of worthlessness/guilt, suicidal thoughts without plan, suicidal attempt, (Hypo) Manic Symptoms:  None Anxiety Symptoms:  Excessive Worry, Psychotic Symptoms:  None PTSD Symptoms: NA  Psychiatric Specialty Exam: Physical Exam:   Completed in ED, reviewed, stable  Review of Systems  Constitutional: Negative.   HENT: Negative.   Eyes: Negative.   Respiratory: Negative.   Cardiovascular: Negative.   Gastrointestinal: Negative.   Genitourinary: Negative.   Musculoskeletal: Negative.   Skin: Negative.   Neurological: Negative.   Endo/Heme/Allergies: Negative.   Psychiatric/Behavioral: Positive for depression. The patient is nervous/anxious.     Blood pressure 135/88, pulse 76, temperature 98.5 F (36.9 C), temperature source Oral, height 5\' 7"  (1.702 m), weight 97.977 kg (216 lb), last menstrual period 06/06/2012.Body mass index is 33.82 kg/(m^2).  General Appearance: Casual  Eye Contact::  Fair  Speech:  Normal Rate  Volume:  Normal  Mood:  Anxious and Depressed  Affect:  Congruent  Thought Process:  Coherent  Orientation:  Full (Time, Place, and Person)  Thought Content:  WDL  Suicidal Thoughts:  Yes.   without intent/plan  Homicidal Thoughts:  No  Memory:  Immediate;   Fair Recent;   Fair Remote;   Fair  Judgement:  Fair  Insight:  Fair  Psychomotor Activity:  Decreased  Concentration:  Fair  Recall:  Fair  Akathisia:  No  Handed:  Right  AIMS (if indicated):     Assets:  Physical Health Resilience  Sleep:       Past Psychiatric History: Diagnosis:  Depression  Hospitalizations:  [facility] x 2, [facility], [place], [place] since 22 yo  Outpatient Care:  Yes, [facility]  Substance Abuse Care:  None  Self-Mutilation:  None  Suicidal Attempts:  Over doses, cut her forearm  Violent Behaviors:  None   Past Medical History:   Past Medical History  Diagnosis Date  . Anxiety   . Mental disorder   . Chronic tonsillitis    None. Allergies:   Allergies  Allergen Reactions  . Penicillins Hives    Face breaks out.   PTA Medications: No prescriptions prior to admission    Previous Psychotropic Medications:  Medication/Dose   Invega   Substance Abuse History in the last 12 months:  no  Consequences of Substance Abuse: NA  Social History:  reports that she quit smoking about 8 months ago. She has never used smokeless tobacco. She reports that she does not drink alcohol or use illicit drugs. Additional Social History: History of alcohol / drug use?: No history of alcohol / drug abuse  Current Place of Residence:   Place of Birth:   Family Members: Marital Status:  Single Children:  Sons:  Daughters: Relationships: Education:  finished  10th grade Educational Problems/Performance: Religious Beliefs/Practices: History of Abuse (Emotional/Phsycial/Sexual) Occupational Experiences; Military History:  None. Legal History: Hobbies/Interests:  Family History:  No family history on file.  Results for orders placed during the hospital encounter of 06/27/12 (from the past 72 hour(s))  CBC     Status: None   Collection Time    06/27/12  7:35 PM      Result  Value Range   WBC 9.3  4.0 - 10.5 K/uL   RBC 4.78  3.87 - 5.11 MIL/uL   Hemoglobin 12.6  12.0 - 15.0 g/dL   HCT 16.1  09.6 - 04.5 %   MCV 81.0  78.0 - 100.0 fL   MCH 26.4  26.0 - 34.0 pg   MCHC 32.6  30.0 - 36.0 g/dL   RDW 40.9  81.1 - 91.4 %   Platelets 255  150 - 400 K/uL  COMPREHENSIVE METABOLIC PANEL     Status: None   Collection Time    06/27/12  7:35 PM      Result Value Range   Sodium 138  135 - 145 mEq/L   Potassium 3.6  3.5 - 5.1 mEq/L   Chloride 104  96 - 112 mEq/L   CO2 25  19 - 32 mEq/L   Glucose, Bld 91  70 - 99 mg/dL   BUN 11  6 - 23 mg/dL   Creatinine, Ser 7.82  0.50 - 1.10 mg/dL   Calcium 9.0  8.4 - 95.6 mg/dL   Total Protein 7.1  6.0 - 8.3 g/dL   Albumin 3.7  3.5 - 5.2 g/dL   AST 16  0 - 37 U/L   ALT 15  0 - 35 U/L   Alkaline Phosphatase 41  39 - 117 U/L   Total Bilirubin 0.3  0.3 - 1.2 mg/dL   GFR calc non Af Amer >90  >90 mL/min   GFR calc Af Amer >90  >90 mL/min   Comment:            The eGFR has been calculated     using the CKD EPI equation.     This calculation has not been     validated in all clinical     situations.     eGFR's persistently     <90 mL/min signify     possible Chronic Kidney Disease.  ETHANOL     Status: None   Collection Time    06/27/12  7:35 PM      Result Value Range   Alcohol, Ethyl (B) <11  0 - 11 mg/dL   Comment:            LOWEST DETECTABLE LIMIT FOR     SERUM ALCOHOL IS 11 mg/dL     FOR MEDICAL PURPOSES ONLY  ACETAMINOPHEN LEVEL     Status: None   Collection Time    06/27/12  7:35 PM      Result Value Range   Acetaminophen (Tylenol), Serum <15.0  10 - 30 ug/mL   Comment:            THERAPEUTIC CONCENTRATIONS VARY     SIGNIFICANTLY. A RANGE OF 10-30     ug/mL MAY BE AN EFFECTIVE     CONCENTRATION FOR MANY PATIENTS.     HOWEVER, SOME ARE BEST TREATED     AT CONCENTRATIONS OUTSIDE THIS     RANGE.     ACETAMINOPHEN CONCENTRATIONS     >150 ug/mL AT 4 HOURS AFTER  INGESTION AND >50 ug/mL AT 12     HOURS  AFTER INGESTION ARE     OFTEN ASSOCIATED WITH TOXIC     REACTIONS.  SALICYLATE LEVEL     Status: Abnormal   Collection Time    06/27/12  7:35 PM      Result Value Range   Salicylate Lvl <2.0 (*) 2.8 - 20.0 mg/dL  URINE RAPID DRUG SCREEN (HOSP PERFORMED)     Status: None   Collection Time    06/27/12  8:54 PM      Result Value Range   Opiates NONE DETECTED  NONE DETECTED   Cocaine NONE DETECTED  NONE DETECTED   Benzodiazepines NONE DETECTED  NONE DETECTED   Amphetamines NONE DETECTED  NONE DETECTED   Tetrahydrocannabinol NONE DETECTED  NONE DETECTED   Barbiturates NONE DETECTED  NONE DETECTED   Comment:            DRUG SCREEN FOR MEDICAL PURPOSES     ONLY.  IF CONFIRMATION IS NEEDED     FOR ANY PURPOSE, NOTIFY LAB     WITHIN 5 DAYS.                LOWEST DETECTABLE LIMITS     FOR URINE DRUG SCREEN     Drug Class       Cutoff (ng/mL)     Amphetamine      1000     Barbiturate      200     Benzodiazepine   200     Tricyclics       300     Opiates          300     Cocaine          300     THC              50  POCT PREGNANCY, URINE     Status: None   Collection Time    06/27/12  9:06 PM      Result Value Range   Preg Test, Ur NEGATIVE  NEGATIVE   Comment:            THE SENSITIVITY OF THIS     METHODOLOGY IS >24 mIU/mL  GLUCOSE, CAPILLARY     Status: Abnormal   Collection Time    06/27/12 11:59 PM      Result Value Range   Glucose-Capillary 106 (*) 70 - 99 mg/dL   Psychological Evaluations:  Assessment:   AXIS I:  Anxiety Disorder NOS and Major Depression, Recurrent severe AXIS II:  Deferred AXIS III:   Past Medical History  Diagnosis Date  . Anxiety   . Mental disorder   . Chronic tonsillitis    AXIS IV:  other psychosocial or environmental problems, problems related to social environment and problems with primary support group AXIS V:  41-50 serious symptoms  Treatment Plan/Recommendations:  Review of chart, vital signs, medications, and notes. 1-Admit for  crisis management and stabilization.  Estimated length of stay 5-7 days past his current stay of 1 2-Individual and group therapy encouraged 3-Medication management for depression and anxiety to reduce current symptoms to base line and improve the patient's overall level of functioning:  Medications reviewed with the patient and she stated she has not taken her medications for 1.5 years until 3 weeks ago at [facility], does not want anything that will make her sleepy, wants to start Lexapro--will consult with MD regarding the Invega and Lexapro 4-Coping skills  for depression and anxiety developing-- 5-Continue crisis stabilization and management 6-Address health issues--monitoring vital signs, stable 7-Treatment plan in progress to prevent relapse of depression and anxiety 8-Psychosocial education regarding relapse prevention and self-care 8-Health care follow up as needed for any health concerns 9-Call for consult with hospitalist for additional specialty patient services as needed.  Treatment Plan Summary: Daily contact with patient to assess and evaluate symptoms and progress in treatment Medication management Current Medications:  Current Facility-Administered Medications  Medication Dose Route Frequency Juris Gosnell Last Rate Last Dose  . acetaminophen (TYLENOL) tablet 650 mg  650 mg Oral Q6H PRN Nanine Means, NP      . alum & mag hydroxide-simeth (MAALOX/MYLANTA) 200-200-20 MG/5ML suspension 30 mL  30 mL Oral Q4H PRN Nanine Means, NP      . magnesium hydroxide (MILK OF MAGNESIA) suspension 30 mL  30 mL Oral Daily PRN Nanine Means, NP        Observation Level/Precautions:  15 minute checks  Laboratory:  Completed and reviewed, stable  Psychotherapy:  Individual and group therapy  Medications:  See MAR  Consultations:  None  Discharge Concerns:  None  Estimated LOS:  5-7 days  Other:     I certify that inpatient services furnished can reasonably be expected to improve the patient's  condition.   Nanine Means, PMH-NP 3/4/20144:07 PM

## 2012-06-28 NOTE — Progress Notes (Signed)
[facility] LCSW Group Therapy  Feelings Around Diagnosis  06/28/2012 3:17 PM  Type of Therapy:  Group Therapy  Participation Level: Limited   Participation Quality:  Appropriate  Affect:  Blunted, Depressed and Flat  Cognitive:  Appropriate  Insight:  Developing/Improving  Engagement in Therapy:  Developing/Improving  Modes of Intervention:  Discussion, Exploration, Problem-solving, Rapport Building and Support  Summary of Progress/Problems:  Patient listened attentively but was unable to state how she feels about her diagnosis.  She shared she admitted due to [facility] after learning boyfriend was seeing someone else.  She also reports losing permanent custody of her children two and a half months ago.  Wynn Banker 06/28/2012, 3:17 PM

## 2012-06-28 NOTE — ED Notes (Signed)
Security called for transport.

## 2012-06-28 NOTE — Tx Team (Signed)
Initial Interdisciplinary Treatment Plan  PATIENT STRENGTHS: (choose at least two) Ability for insight Active sense of humor Financial means Physical Health Supportive family/friends  PATIENT STRESSORS: Educational concerns Medication change or noncompliance   PROBLEM LIST: Problem List/Patient Goals Date to be addressed Date deferred Reason deferred Estimated date of resolution  Want to have better control of anxiety and to learn ways to stay calm 28 Jun 2012     Want to not have suicidal thoughts 28 Jun 2012                                                DISCHARGE CRITERIA:  Ability to meet basic life and health needs Adequate post-discharge living arrangements Improved stabilization in mood, thinking, and/or behavior Motivation to continue treatment in a less acute level of care Verbal commitment to aftercare and medication compliance  PRELIMINARY DISCHARGE PLAN: Outpatient therapy Return to previous living arrangement  PATIENT/FAMIILY INVOLVEMENT: This treatment plan has been presented to and reviewed with the patient, [name], and/or family member.  The patient and family have been given the opportunity to ask questions and make suggestions.  [name] 06/28/2012, 4:46 PM

## 2012-06-28 NOTE — BH Assessment (Signed)
[facility] Assessment Progress Note  Patient accepted to [facility] by Dr. Daleen Bo [id number]. EDP-Dr. Denton Lank notified of patients disposition. Patients nurse-Andrea also informed of patients disposition. Call report # is [phone]. Support paperwork completed and faxed to [facility]. Patient is voluntary and will be transported to [facility] via hospital security.

## 2012-06-28 NOTE — BH Assessment (Addendum)
Assessment Note Patient is a 22 year old [place] American female that was brought to the ER due to a suicide attempt .Patient reports that she has a plan to overdose on medication.   Patient reports a past history of mental illness.  Patient reports that she has not taken her medication in over a year. Patient reports a past history of SI.  Patient reports that she was hospitalized in 2012 at [facility], however documentation in the file reports that she was hospitalized in 2006.   Patient reports previous psychiatric hospitalizations at [facility], [facility] and [facility].  Patient is not able to remember the dates in which she was hospitalized.   Patient reports that she found out that her boyfriend is cheating on her.  Patient reports a history of depression and anxiety.  Patient received a Tele Psych that recommends inpatient hospitalization.  Patient reports that she has a IQ of 56.    Patient denies any substance abuse.   Patient denies any HI.  Patient denies any psychosis.  Patients UDS was negative.  Patients BAL were negative.         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 Anxiety   . Mental disorder   . Chronic tonsillitis    Axis IV: economic problems, other psychosocial or environmental problems, problems related to social environment and problems with primary support group Axis V: 11-20 some danger of hurting self or others possible OR occasionally fails to maintain minimal personal hygiene OR gross impairment in communication  Past Medical History:  Past Medical History  Diagnosis Date  . Anxiety   . Mental disorder   . Chronic tonsillitis     Past Surgical History  Procedure Laterality Date  . Cesarean section    . Tonsillectomy N/A 06/03/2012    Procedure: TONSILLECTOMY;  Surgeon: Osborn Coho, MD;  Location: [facility];   Service: ENT;  Laterality: N/A;  . Mass excision N/A 06/03/2012    Procedure: EXCISION MASS;  Surgeon: Osborn Coho, MD;  Location: [facility];  Service: ENT;  Laterality: N/A;  Excision uvula mass    Family History: History reviewed. No pertinent family history.  Social History:  reports that she quit smoking about 8 months ago. She has never used smokeless tobacco. She reports that she does not drink alcohol or use illicit drugs.  Additional Social History:     CIWA: CIWA-Ar BP: 127/85 mmHg Pulse Rate: 64 COWS:    Allergies:  Allergies  Allergen Reactions  . Penicillins Hives    Face breaks out.    Home Medications:  (Not in a hospital admission)  OB/GYN Status:  Patient's last menstrual period was 05/19/2012.  General Assessment Data Location of Assessment: [facility] ED ACT Assessment: Yes Living Arrangements: Alone Can pt return to current living arrangement?: Yes Admission Status: Voluntary Is patient capable of signing voluntary admission?: Yes Transfer from: Acute Hospital Referral Source: Self/Family/Friend  Education Status Is patient currently in school?: No  Risk to self Suicidal Ideation: Yes-Currently Present Suicidal Intent: Yes-Currently Present Is patient at risk for suicide?: Yes Suicidal Plan?: Yes-Currently Present Specify Current Suicidal Plan: Taking an overdose of medication  Access to Means: Yes Specify Access to Suicidal Means: Patient has access to her own medication.  What has been your use of drugs/alcohol within the last 12 months?: None  Previous Attempts/Gestures:  Yes How many times?: 3 Other Self Harm Risks: cutting her arm Triggers for Past Attempts: Other personal contacts;Unpredictable Intentional Self Injurious Behavior: Cutting Comment - Self Injurious Behavior: cutting her wrist  Family Suicide History: No Recent stressful life event(s): Conflict (Comment);Turmoil (Comment);Other (Comment) Persecutory  voices/beliefs?: No Depression: Yes Depression Symptoms: Despondent;Insomnia;Tearfulness;Isolating;Fatigue;Guilt;Loss of interest in usual pleasures;Feeling worthless/self pity Substance abuse history and/or treatment for substance abuse?: No Suicide prevention information given to non-admitted patients: Yes  Risk to Others Homicidal Ideation: No Thoughts of Harm to Others: No Current Homicidal Intent: No Current Homicidal Plan: No Access to Homicidal Means: No Identified Victim: None  History of harm to others?: No Assessment of Violence: None Noted Violent Behavior Description: None noted Does patient have access to weapons?: No Criminal Charges Pending?: No Does patient have a court date: No  Psychosis Hallucinations: None noted Delusions: None noted  Mental Status Report Appear/Hygiene: Disheveled Eye Contact: Fair Motor Activity: Freedom of movement Speech: Logical/coherent Level of Consciousness: Alert Mood: Depressed Affect: Depressed Anxiety Level: Minimal Thought Processes: Coherent;Relevant Judgement: Unimpaired Orientation: Person;Place;Time;Situation Obsessive Compulsive Thoughts/Behaviors: Minimal  Cognitive Functioning Concentration: Decreased Memory: Recent Intact;Remote Intact IQ: Average Insight: Fair Impulse Control: Poor Appetite: Poor Weight Loss: 0 Weight Gain: 0 Sleep: Decreased Total Hours of Sleep: 4 Vegetative Symptoms: None  ADLScreening [facility] Assessment Services) Patient's cognitive ability adequate to safely complete daily activities?: Yes Patient able to express need for assistance with ADLs?: Yes Independently performs ADLs?: Yes (appropriate for developmental age)  Abuse/Neglect [facility]) Physical Abuse: Denies Verbal Abuse: Denies Sexual Abuse: Denies  Prior Inpatient Therapy Prior Inpatient Therapy: Yes Prior Therapy Dates: 2012 Prior Therapy Facilty/Provider(s): [facility] Reason for Treatment: SI  Prior Outpatient  Therapy Prior Outpatient Therapy: No Prior Therapy Dates: na Prior Therapy Facilty/Provider(s): na Reason for Treatment: na  ADL Screening (condition at time of admission) Patient's cognitive ability adequate to safely complete daily activities?: Yes Patient able to express need for assistance with ADLs?: Yes Independently performs ADLs?: Yes (appropriate for developmental age)       Abuse/Neglect Assessment (Assessment to be complete while patient is alone) Physical Abuse: Denies Verbal Abuse: Denies Sexual Abuse: Denies Values / Beliefs Cultural Requests During Hospitalization: None Spiritual Requests During Hospitalization: None        Additional Information 1:1 In Past 12 Months?: No CIRT Risk: No Elopement Risk: No Does patient have medical clearance?: Yes     Disposition: Patient referred to [facility].   Disposition Initial Assessment Completed: Yes Disposition of Patient: Other dispositions Other disposition(s): Other (Comment)  On Site Evaluation by:   Reviewed with Physician:     Phillip Heal LaVerne 06/28/2012 2:09 AM

## 2012-06-28 NOTE — Progress Notes (Signed)
Patient ID: [name], female   DOB: 1991-02-19, 22 y.o.   MRN: [id number] D:  22 year old [place] American female admitted from [facility] where she presented with suicidal ideation.  Patient recently learned that her boyfriend of 2 months has been cheating on her and became very depressed and anxious.  Patient has a history of bipolar disorder and states she was placed on Invega injections following her last admission here at [facility].  She states she stopped taking it because they sting and she didn't like getting the injections.  States she wants to get back on them and feels she was doing much better when she took them on a regular basis.  States her mother noted that she was better on them as well.  She has a history of several admits to [facility] and other psych hospitals in the past.  She lost custody of her children a few years ago and this still causes her some anxiety and depression when she thinks about it too much.   A:  Completed the admission process.  Patient oriented to the unit and to the group schedules.  She was encouraged to get out and start attending groups right away.   R:  Pleasant and cooperative with the admission process.  Patient seems slow to process new information.  She has been interacting some with peers on the unit.  Safety is maintained.

## 2012-06-28 NOTE — ED Provider Notes (Addendum)
Pt resting, nad, vitals normal. Awaiting act team placement.  If remains in ED today, will get repeat psychiatrist assessment.    Suzi Roots, MD 06/28/12 [phone]  Act team indicates pt accepted to [facility], Dr Daleen Bo.   Suzi Roots, MD 06/28/12 1020

## 2012-06-28 NOTE — Progress Notes (Signed)
D: Patient in bed resting on approach.  Patient appears flat and depressed.  Patient isolated in her room.  Patient stated everything was ok.  Patient states, "I am here so I can stop the suicidal thoughts."  Patient states she is having passive SI denies HI and denies AVH.  Patient verbally contracts for safety.   A: Staff to monitor Q 15 mins for safety.  Encouragement and support offered.  Patient asleep and did not receive trazodone for sleep. R: Patient remains safe on the unit.  Patient did not attend group tonight.  No medications administered.

## 2012-06-29 DIAGNOSIS — F411 Generalized anxiety disorder: Secondary | ICD-10-CM

## 2012-06-29 DIAGNOSIS — F332 Major depressive disorder, recurrent severe without psychotic features: Principal | ICD-10-CM

## 2012-06-29 NOTE — Progress Notes (Signed)
[facility] LCSW Group Therapy  Feelings Around Diagnosis  06/29/2012 3:27 PM  Type of Therapy:  Group Therapy  Participation Level: Limited   Participation Quality:  Appropriate  Affect:  Blunted, Depressed and Flat  Cognitive:  Appropriate  Insight:  Developing/Improving  Engagement in Therapy:  Developing/Improving  Modes of Intervention:  Discussion, Exploration, Problem-solving, Rapport Building and Support  Summary of Progress/Problems:  Patient shared she has to let go of anger.  She shared she needs to learn to talk with someone and hopes she will be able to talk with a counselor.  Patient stated she does not want her mother to know she and boyfriend are having problems.  Wynn Banker 06/29/2012, 3:27 PM

## 2012-06-29 NOTE — Progress Notes (Signed)
Adult Psychoeducational Group Note  Date:  06/29/2012 Time:  7:01 PM  Group Topic/Focus:  Personal Choices and Values:   The focus of this group is to help patients assess and explore the importance of values in their lives, how their values affect their decisions, how they express their values and what opposes their expression.  Participation Level:  Minimal  Participation Quality:  Appropriate and Attentive  Affect:  Appropriate  Cognitive:  Appropriate  Insight: Appropriate  Engagement in Group:  Developing/Improving  Modes of Intervention:  Discussion, Education and Support  Additional Comments:  Lanesha attended group and participated. Patient shared personal term of values and choices. Patient was asked to expalin the negative and positive choices and values that have been made throughout lifespan, but patient did not comment. Patient then discussed what the outcome of the negative choices effect on patient life. Patient completed form in group on Identifying values and Choosing a value orientating life worksheet, and explained answers within the group.   Karleen Hampshire Brittini 06/29/2012, 7:01 PM

## 2012-06-29 NOTE — Progress Notes (Signed)
  D) Patient quiet but cooperative upon my assessment. Patient completed Patient Self Inventory, reports slept "well," and  appetite is "good." Patient rates depression as  8 /10, patient rates hopeless feelings as  2/10. Patient endorses constant SI, contracts verbally for safety with RN. Patient denies HI, denies A/V hallucinations.   A) Patient offered support and encouragement, patient encouraged to discuss feelings/concerns with staff. Patient verbalized understanding. Patient monitored Q15 minutes for safety. Patient met with MD  to discuss today's goals and plan of care.  R) Patient isolates to room during free time, attending most  groups in day room and meals in dining room. Patient appropriate with staff and peers.   Patient taking medications as ordered. Will continue to monitor.

## 2012-06-29 NOTE — [facility] Counselor (Signed)
Adult Comprehensive Assessment  Patient ID: [name], female   DOB: 03-Jan-1991, 22 y.o.   MRN: [id number]  Information Source: Information source: Patient  Current Stressors:  Educational / Learning stressors: None Employment / Job issues: None Family Relationships: Recently learned boyfriend cheating on her Surveyor, quantity / Lack of resources (include bankruptcy): None Housing / Lack of housing: None Physical health (include injuries & life threatening diseases): None Social relationships: None Substance abuse: None Bereavement / Loss: None  Living/Environment/Situation:  Living Arrangements: Alone Living conditions (as described by patient or guardian): Good How long has patient lived in current situation?: two months What is atmosphere in current home: Comfortable  Family History:  Marital status: Single Does patient have children?: Yes How many children?: 2 How is patient's relationship with their children?: Children have been removed from patient custody  Childhood History:  By whom was/is the patient raised?: Mother Additional childhood history information: Good Description of patient's relationship with caregiver when they were a child: Close Patient's description of current relationship with people who raised him/her: Very close Does patient have siblings?: Yes Number of Siblings: 2 Description of patient's current relationship with siblings: Good family relationship Did patient suffer any verbal/emotional/physical/sexual abuse as a child?: No Did patient suffer from severe childhood neglect?: No Has patient ever been sexually abused/assaulted/raped as an adolescent or adult?: No Was the patient ever a victim of a crime or a disaster?: No Witnessed domestic violence?: No Has patient been effected by domestic violence as an adult?: No  Education:  Highest grade of school patient has completed: 11th Currently a student?: No Learning disability?:  No  Employment/Work Situation:   Employment situation: On disability Why is patient on disability: Learning Disability How long has patient been on disability: All of her life Patient's job has been impacted by current illness: No What is the longest time patient has a held a job?: No hx of employment Where was the patient employed at that time?: N/A Has patient ever been in the [employer]?: No Has patient ever served in Buyer, retail?: No  Financial Resources:   Financial resources: Writer Does patient have a Lawyer or guardian?: Yes Name of representative payee or guardian: Mother - [name]  Alcohol/Substance Abuse:   What has been your use of drugs/alcohol within the last 12 months?: Denies If attempted suicide, did drugs/alcohol play a role in this?: No Alcohol/Substance Abuse Treatment Hx: Denies past history Has alcohol/substance abuse ever caused legal problems?: No  Social Support System:   Forensic psychologist System: None Type of faith/religion: None How does patient's faith help to cope with current illness?: N/A  Leisure/Recreation:   Leisure and Hobbies: Unable to identify  Strengths/Needs:   What things does the patient do well?: Good cook In what areas does patient struggle / problems for patient: Unable to talk with people  Discharge Plan:   Does patient have access to transportation?: Yes Will patient be returning to same living situation after discharge?: Yes Currently receiving community mental health services: Yes (From Whom) Vesta Mixer) If no, would patient like referral for services when discharged?: Yes (What county?) (Needs referral for counseling) Does patient have financial barriers related to discharge medications?: No Patient description of barriers related to discharge medications: Patient has medications.  Summary/Recommendations:  [name] is a 22 years old African American female admitted with  Major Depression  Disorder. She will Patient will benefit from crisis stabilization, evaluation for medication management, psycho education groups for coping skills development,  group therapy and assistance with discharge planning.     Hodnett, Joesph July. 06/29/2012

## 2012-06-29 NOTE — Progress Notes (Signed)
Recreation Therapy Notes  Date: 03.05.2014  Time: 2:55pm  Location: [address] Day Room   Group Topic/Focus: Animal Assist Activities/Therapy (AAA/T)   Participation Level:  Did not attend  Denise L Blanchfield, LRT/CTRS  Blanchfield, Denise L 06/29/2012 4:18 PM 

## 2012-06-29 NOTE — [facility] Suicide Risk Assessment (Signed)
Suicide Risk Assessment  Admission Assessment     Nursing information obtained from:  Patient Demographic factors:  Adolescent or young adult;Low socioeconomic status;Living alone Current Mental Status:  Suicidal ideation indicated by patient Loss Factors:  Loss of significant relationship Historical Factors:  Prior suicide attempts Risk Reduction Factors:  Sense of responsibility to family;Religious beliefs about death  CLINICAL FACTORS:   Bipolar Disorder:   Depressive phase. Got very upset after she found out that his boyfriend was cheating on her. She was wanting to OD. She called for help. She has been off her medications (Lexapro and Invega Susstena IM)  COGNITIVE FEATURES THAT CONTRIBUTE TO RISK:  Closed-mindedness Thought constriction (tunnel vision)    SUICIDE RISK:   Moderate:  Frequent suicidal ideation with limited intensity, and duration, some specificity in terms of plans, no associated intent, good self-control, limited dysphoria/symptomatology, some risk factors present, and identifiable protective factors, including available and accessible social support.  PLAN OF CARE: She is going to be started in individual and group therapy and placed back on her medications. Will work on Pharmacologist.  I certify that inpatient services furnished can reasonably be expected to improve the patient's condition.  LUGO,IRVING A 06/29/2012, 1:32 PM

## 2012-06-29 NOTE — Progress Notes (Signed)
[facility] LCSW Aftercare Discharge Planning Group Note  06/29/2012 12:11 PM  Participation Quality:  Appropriate  Affect:  Appropriate and Depressed  Cognitive:  Appropriate  Insight:  Engaged  Engagement in Group:  Engaged  Modes of Intervention:  Exploration, Problem-solving, Rapport Building and Support  Summary of Progress/Problems:  Patient advised of having a lot of worries.  She endorses SI but able to contract for safety.  Patient rated depression/anxiety and helplessness at nine and hopelessness at seven.  Patient advised of being seen at [facility] for medication management.  She asked to be referred for counseling.  Wynn Banker 06/29/2012, 12:11 PM

## 2012-06-29 NOTE — Tx Team (Signed)
Interdisciplinary Treatment Plan Update   Date Reviewed:  06/29/2012  Time Reviewed:  10:06 AM  Progress in Treatment:   Attending groups: Yes Participating in groups: Yes Taking medication as prescribed: Yes  Tolerating medication: Yes Family/Significant other contact made: Contact to be made with family. Patient understands diagnosis: Yes  Discussing patient identified problems/goals with staff: Yes Medical problems stabilized or resolved: Yes Denies suicidal/homicidal ideation: Yes Patient has not harmed self or others: Yes  For review of initial/current patient goals, please see plan of care.  Estimated Length of Stay:  2-3 days  Reasons for Continued Hospitalization:  Anxiety Depression Medication stabilization Suicidal ideation  New Problems/Goals identified:    Discharge Plan or Barriers:   Home with outpatient follow up at [facility]  Additional Comments:  Patient shared she admitted to hospital due to [facility] after learning boyfriend cheating.  She also reports recently losing permanent custody of her children two months ago.  She denies SI today and rates depression and anxiety at nine.  Patient is followed by [facility].  Attendees:  Patient:  06/29/2012 10:06 AM   Signature: 06/29/2012 10:06 AM  Signature:Tina Arlana Pouch, RN 06/29/2012 10:06 AM  Signature: Tomasa Rand, RN 06/29/2012 10:06 AM  Signature: 06/29/2012 10:06 AM  Signature:  Fransisca Kaufmann, NP-C  06/29/2012 10:06 AM  Signature:  Juline Patch, LCSW 06/29/2012 10:06 AM  Signature: Silverio Decamp, PMH-NP 06/29/2012 10:06 AM  Signature:  06/29/2012 10:06 AM  Signature:    Signature:    Signature:    Signature:      Scribe for Treatment Team:   Juline Patch,  06/29/2012 10:06 AM

## 2012-06-29 NOTE — Progress Notes (Signed)
D: Patient in the dayroom watching TV on approach.  Patient states her day was good.  Patient rates depression 5/10.  Patient states she has had anxiety on and off.  Patient states she needs to make sure she follows up with [facility].  Patient states she also needs to make sure to go see a therapist.  Patient states she is having Passive SI denies HI and denies AVH.      A: Staff to monitor Q 15 mins for safety.  Encouragement and support offered.  No medications administered tonight.  Patient refused her Trazodone. R: Patient remains safe on the unit.  Patient attended group tonight.  Patient had no medications to administer tonight.  Patient quiet and cooperative.

## 2012-06-29 NOTE — Progress Notes (Signed)
Recreation Therapy Notes  Date: 03.05.2014 Time: 3:00pm Location: [address] Day Room      Group Topic/Focus: Communication  Participation Level: Minimal  Participation Quality: Appropriate  Affect: Flat  Cognitive: Appropriate     Additional Comments: Patient attended group, but did not participate in group activity.    Marykay Lex Blanchfield, LRT/CTRS   Jearl Klinefelter 06/29/2012 4:27 PM

## 2012-06-30 MED ORDER — FLUOXETINE HCL 10 MG PO CAPS
10.0000 mg | ORAL_CAPSULE | Freq: Every day | ORAL | Status: DC
  Administered 2012-06-30 – 2012-07-04 (×5): 10 mg via ORAL
  Filled 2012-06-30 (×7): qty 1

## 2012-06-30 NOTE — Progress Notes (Signed)
Adult Psychoeducational Group Note  Date:  06/30/2012 Time:  1100   Group Topic/Focus:  Rediscovering Joy:   The focus of this group is to explore various ways to relieve stress in a positive manner.  Participation Level:  Active  Participation Quality:  Appropriate, Attentive, Sharing and Supportive  Affect:  Appropriate  Cognitive:  Alert, Appropriate and Oriented  Insight: Appropriate  Engagement in Group:  Engaged and Supportive  Modes of Intervention:  Discussion, Education and Socialization  Additional Comments:  Patient verbalizes the thing that brings her the most joy is "gospel music."   Noah Charon 06/30/2012, 12:36 PM

## 2012-06-30 NOTE — Progress Notes (Signed)
Pt attended Karaoke group, was attentive and supportive of peers.  

## 2012-06-30 NOTE — Progress Notes (Signed)
  D) Patient pleasant and cooperative upon my assessment. Patient appears more bright and animated this morning. Patient verbalizes "when am I going to get some medicine for my depression?"  Patient encouraged to speak with MD/PA re medication orders, patient verbalizes understanding. Patient completed Patient Self Inventory, reports slept "fair," and  appetite is "good." Patient rates depression as   6/10, patient rates hopeless feelings as 2 /10. Patient endorses "off and on" SI, contracts verbally for safety with staff. Patient denies HI, denies A/V hallucinations.   A) Patient offered support and encouragement, patient encouraged to discuss feelings/concerns with staff. Patient verbalized understanding. Patient monitored Q15 minutes for safety. Patient met with MD  to discuss today's goals and plan of care.  R) Patient visible in milieu, attending groups in day room and meals in dining room. Patient appropriate with staff and peers.   Patient taking medications as ordered. Patient insightful with a plan to "take my meds and follow up with [facility]" after discharge. Will continue to monitor.

## 2012-06-30 NOTE — Progress Notes (Signed)
[facility] Group Notes:  (Nursing/MHT/Case Management/Adjunct)  Date:  06/30/2012  Time:  1:23 AM  Type of Therapy:  Group Therapy  Participation Level:  Minimal  Participation Quality:  Attentive  Affect:  Flat and Labile  Cognitive:  Appropriate  Insight:  Limited  Engagement in Group:  Developing/Improving  Modes of Intervention:  Socialization and Support  Summary of Progress/Problems: Pt. Stated her children were important to her getting better.  [name] 06/30/2012, 1:23 AM

## 2012-06-30 NOTE — Progress Notes (Signed)
[facility] LCSW Group Therapy     [facility] 1:15 - 2:30 PM   06/30/2012 3:31 PM  Type of Therapy:  Group Therapy  Participation Level:  Minimal  Participation Quality:  Appropriate and Attentive  Affect:  Appropriate  Cognitive:  Appropriate  Insight:  Limited  Engagement in Therapy:  Limited  Modes of Intervention:  Discussion, Education, Exploration, Problem-solving, Rapport Building and Socialization  Summary of Progress/Problems:  Patient listened attentively to speaker from [facility] but made no comments on the presentation.  Wynn Banker 06/30/2012, 3:31 PM

## 2012-06-30 NOTE — Progress Notes (Signed)
[facility] MD Progress Note  06/30/2012 11:13 AM [name]  MRN:  [id number] Subjective:  Patient continues to report being very depressed. Denies SI today.  Diagnosis:   Axis I: Bipolar, Depressed Axis II: Deferred Axis III:  Past Medical History  Diagnosis Date  . Anxiety   . Mental disorder   . Chronic tonsillitis    Axis IV: other psychosocial or environmental problems Axis V: 41-50 serious symptoms  ADL's:  Intact  Sleep: Fair  Appetite:  Fair   Psychiatric Specialty Exam: Review of Systems  Constitutional: Negative.   HENT: Negative.   Eyes: Negative.   Respiratory: Negative.   Cardiovascular: Negative.   Gastrointestinal: Negative.   Genitourinary: Negative.   Musculoskeletal: Negative.   Skin: Negative.   Neurological: Negative.   Endo/Heme/Allergies: Negative.   Psychiatric/Behavioral: Positive for depression.    Blood pressure 120/81, pulse 76, temperature 98.4 F (36.9 C), temperature source Oral, resp. rate 16, height 5\' 7"  (1.702 m), weight 97.977 kg (216 lb), last menstrual period 06/06/2012.Body mass index is 33.82 kg/(m^2).  General Appearance: Casual  Eye Contact::  Fair  Speech:  Slow  Volume:  Decreased  Mood:  Anxious and Depressed  Affect:  Constricted and Depressed  Thought Process:  Circumstantial  Orientation:  Full (Time, Place, and Person)  Thought Content:  Rumination  Suicidal Thoughts:  No  Homicidal Thoughts:  No  Memory:  Immediate;   Fair Recent;   Fair Remote;   Fair  Judgement:  Fair  Insight:  Present  Psychomotor Activity:  Decreased  Concentration:  Fair  Recall:  Fair  Akathisia:  No  Handed:  Right  AIMS (if indicated):     Assets:  Communication Skills Desire for Improvement  Sleep:  Number of Hours: 6.75   Current Medications: Current Facility-Administered Medications  Medication Dose Route Frequency Vegas Fritze Last Rate Last Dose  . acetaminophen (TYLENOL) tablet 650 mg  650 mg Oral Q6H PRN Nanine Means, NP       . alum & mag hydroxide-simeth (MAALOX/MYLANTA) 200-200-20 MG/5ML suspension 30 mL  30 mL Oral Q4H PRN Nanine Means, NP      . FLUoxetine (PROZAC) capsule 10 mg  10 mg Oral Daily Himabindu Ravi, MD      . magnesium hydroxide (MILK OF MAGNESIA) suspension 30 mL  30 mL Oral Daily PRN Nanine Means, NP      . traZODone (DESYREL) tablet 100 mg  100 mg Oral QHS Nanine Means, NP        Lab Results: No results found for this or any previous visit (from the past 48 hour(s)).  Physical Findings: AIMS: Facial and Oral Movements Muscles of Facial Expression: None, normal Lips and Perioral Area: None, normal Jaw: None, normal Tongue: None, normal,Extremity Movements Upper (arms, wrists, hands, fingers): None, normal Lower (legs, knees, ankles, toes): None, normal, Trunk Movements Neck, shoulders, hips: None, normal, Overall Severity Severity of abnormal movements (highest score from questions above): None, normal Incapacitation due to abnormal movements: None, normal Patient's awareness of abnormal movements (rate only patient's report): No Awareness, Dental Status Current problems with teeth and/or dentures?: No Does patient usually wear dentures?: No  CIWA:    COWS:     Treatment Plan Summary: Daily contact with patient to assess and evaluate symptoms and progress in treatment Medication management  Plan: Start Prozac at 10mg  po qd. Discussed side effects with patient. Encouraged top attend groups and participate.  Medical Decision Making Problem Points:  Established problem, stable/improving (1), Review  of last therapy session (1) and Review of psycho-social stressors (1) Data Points:  Review of medication regiment & side effects (2) Review of new medications or change in dosage (2)  I certify that inpatient services furnished can reasonably be expected to improve the patient's condition.   RAVI, HIMABINDU 06/30/2012, 11:13 AM

## 2012-06-30 NOTE — Progress Notes (Signed)
[facility] LCSW Aftercare Discharge Planning Group Note  06/30/2012 1:15 PM  Participation Quality:  Appropriate and Attentive  Affect:  Appropriate and Depressed  Cognitive:  Appropriate  Insight:  Engaged  Engagement in Group:  Engaged  Modes of Intervention:  Education, Exploration, Dentist, Rapport Building and Support  Summary of Progress/Problems:  Patient reports doing well and learning a lot in group.  She denies SI/HI and rates depression at three and anxiety at five.  Patient shared she plans to end the relationship with boyfriend who has been unfaithful. Daily workbook provided.   Wynn Banker 06/30/2012, 1:15 PM

## 2012-07-01 ENCOUNTER — Encounter (HOSPITAL_COMMUNITY): Payer: Self-pay

## 2012-07-01 NOTE — Progress Notes (Signed)
[facility] MD Progress Note  07/01/2012 1:02 PM [name]  MRN:  [id number] Subjective:  Patient tolerating medication well. Continues to be depressed, working on Pharmacologist. Diagnosis:   Axis I: Major Depression, Recurrent severe Axis II: Deferred Axis III:  Past Medical History  Diagnosis Date  . Anxiety   . Mental disorder   . Chronic tonsillitis    Axis IV: other psychosocial or environmental problems Axis V: 41-50 serious symptoms  ADL's:  Intact  Sleep: Fair  Appetite:  Fair   Psychiatric Specialty Exam: Review of Systems  Constitutional: Negative.   HENT: Negative.   Eyes: Negative.   Respiratory: Negative.   Cardiovascular: Negative.   Gastrointestinal: Negative.   Genitourinary: Negative.   Musculoskeletal: Negative.   Skin: Negative.   Neurological: Negative.   Endo/Heme/Allergies: Negative.   Psychiatric/Behavioral: Positive for depression. The patient is nervous/anxious.     Blood pressure 129/77, pulse 102, temperature 98.6 F (37 C), temperature source Oral, resp. rate 16, height 5\' 7"  (1.702 m), weight 97.977 kg (216 lb), last menstrual period 06/06/2012.Body mass index is 33.82 kg/(m^2).  General Appearance: Casual  Eye Contact::  Fair  Speech:  Clear and Coherent  Volume:  Normal  Mood:  Anxious and Depressed  Affect:  Constricted  Thought Process:  Coherent  Orientation:  Full (Time, Place, and Person)  Thought Content:  WDL  Suicidal Thoughts:  No  Homicidal Thoughts:  No  Memory:  Immediate;   Fair Recent;   Fair Remote;   Fair  Judgement:  Fair  Insight:  Fair  Psychomotor Activity:  Normal  Concentration:  Fair  Recall:  Fair  Akathisia:  No  Handed:  Right  AIMS (if indicated):     Assets:  Communication Skills Desire for Improvement  Sleep:  Number of Hours: 6.5   Current Medications: Current Facility-Administered Medications  Medication Dose Route Frequency Provider Last Rate Last Dose  . acetaminophen (TYLENOL) tablet 650  mg  650 mg Oral Q6H PRN Nanine Means, NP      . alum & mag hydroxide-simeth (MAALOX/MYLANTA) 200-200-20 MG/5ML suspension 30 mL  30 mL Oral Q4H PRN Nanine Means, NP      . FLUoxetine (PROZAC) capsule 10 mg  10 mg Oral Daily Himabindu Ravi, MD   10 mg at 07/01/12 0837  . magnesium hydroxide (MILK OF MAGNESIA) suspension 30 mL  30 mL Oral Daily PRN Nanine Means, NP      . traZODone (DESYREL) tablet 100 mg  100 mg Oral QHS Nanine Means, NP        Lab Results: No results found for this or any previous visit (from the past 48 hour(s)).  Physical Findings: AIMS: Facial and Oral Movements Muscles of Facial Expression: None, normal Lips and Perioral Area: None, normal Jaw: None, normal Tongue: None, normal,Extremity Movements Upper (arms, wrists, hands, fingers): None, normal Lower (legs, knees, ankles, toes): None, normal, Trunk Movements Neck, shoulders, hips: None, normal, Overall Severity Severity of abnormal movements (highest score from questions above): None, normal Incapacitation due to abnormal movements: None, normal Patient's awareness of abnormal movements (rate only patient's report): No Awareness, Dental Status Current problems with teeth and/or dentures?: No Does patient usually wear dentures?: No  CIWA:    COWS:     Treatment Plan Summary: Daily contact with patient to assess and evaluate symptoms and progress in treatment Medication management  Plan: Continue current plan of care. Increase Prozac to 20mg  if symptoms continue over weekend. Plan for discharge once  stable. Medical Decision Making Problem Points:  Established problem, stable/improving (1), Review of last therapy session (1) and Review of psycho-social stressors (1) Data Points:  Review of medication regiment & side effects (2)  I certify that inpatient services furnished can reasonably be expected to improve the patient's condition.   RAVI, HIMABINDU 07/01/2012, 1:02 PM

## 2012-07-01 NOTE — Progress Notes (Signed)
Patient ID: [name], female   DOB: 09/07/1990, 22 y.o.   MRN: [id number]   D: Patient lying in bed on approach tonight reading. Pleasant and smiling when interacting with her. Reports mood improvement since admission and currently denies any SI at this time. Didn't want her sleep medication ordered at this time but told her she can have if she has trouble going to sleep. A: Staff will monitor on q 15 minute checks, follow treatment plan, and give meds as ordered. R: Cooperative on unit and getting ready for bed.

## 2012-07-01 NOTE — Progress Notes (Signed)
D) Pt has attended the groups and interacts with her peers. Denies SI and HI Rates her depression at a 1 and her hopelessness at a 0. A) Given support and reassurance, along with praise. R) Pt remains safe.

## 2012-07-01 NOTE — Progress Notes (Signed)
Recreation Therapy Notes  Date: 03.07.2014  Time: 3:00pm  Location: Art Room   Group Topic/Focus: Leisure Education   Participation Level:  Minimal  Participation Quality:  Appropriate   Affect:  Flat to Euthymic at times  Cognitive:  Oriented   Additional Comments: Patient played adapted boggle. Patient was given 1 minute to list as many leisure and recreation activities as possible that began with a letter of the alphabet. LRT selected letter from container for patients. Patient with peers then generated group list for the letters called out. At the conclusion of group patients shouted out activities for the letters in the alphabet that were not selected from the container.   Patient attended group session, but did not participate in activity. Patient observed group session, but did not contribute to group list of leisure and recreation activities. Patient could be seen smiling at her peers periodically, but for the most part she appeared flat and guarded.   Marykay Lex Blanchfield, LRT/CTRS   Jearl Klinefelter 07/01/2012 4:19 PM

## 2012-07-01 NOTE — Progress Notes (Signed)
[facility] LCSW Group Therapy        Feelings Around Relapse        1:15-2:30 PM          07/01/2012 4:31 PM  Type of Therapy:  Group Therapy  Participation Level:  Minimal  Participation Quality:  Appropriate and Attentive  Affect:  Appropriate  Cognitive:  Appropriate  Insight:  Limited  Engagement in Therapy:  Limited  Modes of Intervention:  Discussion, Education, Exploration, Problem-solving, Rapport Building and Socialization  Summary of Progress/Problems:  Patient shared relapsed for her would be not taking her medications and staying in the  Relationship with with boyfriend. Patient shared it feels good to be able to talk with people.  Wynn Banker 07/01/2012, 4:31 PM

## 2012-07-02 DIAGNOSIS — F3130 Bipolar disorder, current episode depressed, mild or moderate severity, unspecified: Secondary | ICD-10-CM | POA: Diagnosis present

## 2012-07-02 NOTE — Progress Notes (Signed)
[facility] MD Progress Note  07/02/2012 6:26 PM [name]  MRN:  [id number] Subjective: Pt says her BF of 2 mos. was taking with GF on her cell phone and she became depressed  Diagnosis:   Axis I: Bipolar, Depressed Axis II: Deferred Axis III:  Past Medical History  Diagnosis Date  . Anxiety   . Mental disorder   . Chronic tonsillitis    Axis IV: other psychosocial or environmental problems, problems related to social environment and problems with primary support group Axis V: 41-50 serious symptoms  ADL's:  Intact  Sleep: Good  Appetite:  Good  Suicidal Ideation:  Plan:  not active Homicidal Ideation:  Plan:  not active AEB (as evidenced by):  Psychiatric Specialty Exam: ROS  Blood pressure 127/88, pulse 81, temperature 97.7 F (36.5 C), temperature source Oral, resp. rate 16, height 5\' 7"  (1.702 m), weight 97.977 kg (216 lb), last menstrual period 06/06/2012.Body mass index is 33.82 kg/(m^2).  General Appearance: Casual  Eye Contact::  Good  Speech:  Clear and Coherent  Volume:  Normal  Mood:  Depressed  Affect:  Appropriate  Thought Process:  Coherent and Goal Directed  Orientation:  Full (Time, Place, and Person)  Thought Content:  NA  Suicidal Thoughts:  No  Homicidal Thoughts:  No  Memory:  Immediate;   Good Recent;   Good Remote;   Good  Judgement:  Fair  Insight:  Fair  Psychomotor Activity:  Normal  Concentration:  Good  Recall:  Good  Akathisia:  NA  Handed:  Right  AIMS (if indicated):     Assets:  Desire for Improvement  Sleep:  Number of Hours: 6.75   Current Medications: Current Facility-Administered Medications  Medication Dose Route Frequency Hosam Mcfetridge Last Rate Last Dose  . acetaminophen (TYLENOL) tablet 650 mg  650 mg Oral Q6H PRN Nanine Means, NP      . alum & mag hydroxide-simeth (MAALOX/MYLANTA) 200-200-20 MG/5ML suspension 30 mL  30 mL Oral Q4H PRN Nanine Means, NP      . FLUoxetine (PROZAC) capsule 10 mg  10 mg Oral Daily Himabindu  Ravi, MD   10 mg at 07/02/12 7829  . magnesium hydroxide (MILK OF MAGNESIA) suspension 30 mL  30 mL Oral Daily PRN Nanine Means, NP      . traZODone (DESYREL) tablet 100 mg  100 mg Oral QHS Nanine Means, NP        Lab Results: No results found for this or any previous visit (from the past 48 hour(s)).  Physical Findings: AIMS: Facial and Oral Movements Muscles of Facial Expression: None, normal Lips and Perioral Area: None, normal Jaw: None, normal Tongue: None, normal,Extremity Movements Upper (arms, wrists, hands, fingers): None, normal Lower (legs, knees, ankles, toes): None, normal, Trunk Movements Neck, shoulders, hips: None, normal, Overall Severity Severity of abnormal movements (highest score from questions above): None, normal Incapacitation due to abnormal movements: None, normal Patient's awareness of abnormal movements (rate only patient's report): No Awareness, Dental Status Current problems with teeth and/or dentures?: No Does patient usually wear dentures?: No  CIWA:    COWS:     Treatment Plan Summary: Pt will participate in groups.  She will report any problems with side effects and report any suicidal thoughts at least two days before discharge   Plan:  Medical Decision Making Problem Points:  Established problem, stable/improving (1) and Review of last therapy session (1) Data Points:  Review or order medicine tests (1)  I certify that inpatient  services furnished can reasonably be expected to improve the patient's condition.   BOGARD, PHYLLIS 07/02/2012, 6:26 PM

## 2012-07-02 NOTE — Clinical Social Work Note (Signed)
[facility] Group Notes:  (Clinical Social Work)  07/02/2012   3:00-4:00PM  Summary of Progress/Problems:   The main focus of today's process group was for the patient to identify something in their life that led to their hospitalization that they would like to change, then to discuss their motivation to change.  The Stages of Change were explained to the group, then each patient identified where they are in that process.  Scale was used with motivational interviewing to determine the patient's current motivation to change, with 1 being total lack of motivation and 10 being total commitment to change.  The patient expressed that she caught her boyfriend cheating and does not know what to do.  She said her motivation to change herself is 8 out of 10.  Type of Therapy:  Process Group  Participation Level:  Active  Participation Quality:  Attentive  Affect:  Blunted  Cognitive:  Oriented  Insight:  Developing/Improving  Engagement in Therapy:  Developing/Improving  Modes of Intervention:  Clarification, Support and Processing, Exploration, Discussion   Ambrose Mantle, LCSW 07/02/2012, 4:45 PM

## 2012-07-02 NOTE — Progress Notes (Signed)
[facility] Group Notes:  (Nursing/MHT/Case Management/Adjunct)  Date:  07/01/2012 Time:  2000  Type of Therapy:  Psychoeducational Skills  Participation Level:  Minimal  Participation Quality:  Inattentive  Affect:  Depressed  Cognitive:  Lacking  Insight:  Limited  Engagement in Group:  Limited  Modes of Intervention:  Education  Summary of Progress/Problems: The patient was very quiet in group and did not participate until it was her turn to speak. She verbalized that she laughed a great deal today. In addition, she verbalized that she tried to stay "more focused". Her goal for tomorrow is to begin preparing herself for discharge.   Hazle Coca S 07/02/2012, 12:42 AM

## 2012-07-02 NOTE — Progress Notes (Signed)
D) Pt states that she is not feeling depressed or hopeless. Denies SI and HI. Has attended the groups and does interact with select peers. Chose  to participate more today than in other groups.  A) Given support and reassurance. R) Denies SI and HI. States that she feels ready to go home.

## 2012-07-03 DIAGNOSIS — J029 Acute pharyngitis, unspecified: Secondary | ICD-10-CM | POA: Diagnosis not present

## 2012-07-03 MED ORDER — MENTHOL 3 MG MT LOZG
1.0000 | LOZENGE | OROMUCOSAL | Status: DC | PRN
  Administered 2012-07-03 – 2012-07-04 (×2): 3 mg via ORAL

## 2012-07-03 MED ORDER — ONDANSETRON 4 MG PO TBDP
4.0000 mg | ORAL_TABLET | Freq: Three times a day (TID) | ORAL | Status: DC | PRN
  Administered 2012-07-03: 4 mg via ORAL

## 2012-07-03 NOTE — Progress Notes (Signed)
Patient ID: [name], female   DOB: 10/10/1990, 22 y.o.   MRN: [id number] [facility] MD Progress Note  07/03/2012 3:57 PM [name]  MRN:  [id number] Subjective:  "My throat hurts today, painful to swallow." Otherwise the patient states she is doing well. Objective: Patient resting in bed, during group time. Reports that she has been vomiting today,but denies any fever. Reports that she had her tonsils out 3 weeks ago.   Diagnosis:   Axis I: Bipolar, Depressed Axis II: Deferred Axis III:  Past Medical History  Diagnosis Date  . Anxiety   . Mental disorder   . Chronic tonsillitis    Axis IV: other psychosocial or environmental problems, problems related to social environment and problems with primary support group Axis V: 41-50 serious symptoms  ADL's:  Intact  Sleep: Good  Appetite:  Good  Suicidal Ideation:  Plan:  not active Homicidal Ideation:  Plan:  not active AEB (as evidenced by): Patients reports of decrease in symptoms, affect and report of decrease in symptoms.  Psychiatric Specialty Exam: Review of Systems  Constitutional: Negative.  Negative for fever, chills, weight loss, malaise/fatigue and diaphoresis.  HENT: Negative.  Negative for congestion and sore throat.        Sore throat   Eyes: Negative for blurred vision, double vision and photophobia.  Respiratory: Negative for cough, shortness of breath and wheezing.   Cardiovascular: Negative for chest pain, palpitations and PND.  Gastrointestinal: Negative for heartburn, nausea, vomiting, abdominal pain, diarrhea and constipation.  Musculoskeletal: Negative for myalgias, joint pain and falls.  Neurological: Negative for dizziness, tingling, tremors, sensory change, speech change, focal weakness, seizures, loss of consciousness, weakness and headaches.  Endo/Heme/Allergies: Negative for polydipsia. Does not bruise/bleed easily.  Psychiatric/Behavioral: Negative for depression, suicidal ideas, hallucinations,  memory loss and substance abuse. The patient is not nervous/anxious and does not have insomnia.     Blood pressure 126/74, pulse 111, temperature 98 F (36.7 C), temperature source Oral, resp. rate 16, height 5\' 7"  (1.702 m), weight 97.977 kg (216 lb), last menstrual period 06/06/2012.Body mass index is 33.82 kg/(m^2).  General Appearance: Casual  Eye Contact::  Good  Speech:  Clear and Coherent  Volume:  Normal  Mood:  Depressed  Affect:  Appropriate  Thought Process:  Coherent and Goal Directed  Orientation:  Full (Time, Place, and Person)  Thought Content:  NA  Suicidal Thoughts:  No  Homicidal Thoughts:  No  Memory:  Immediate;   Good Recent;   Good Remote;   Good  Judgement:  Fair  Insight:  Fair  Psychomotor Activity:  Normal  Concentration:  Good  Recall:  Good  Akathisia:  NA  Handed:  Right  AIMS (if indicated):     Assets:  Desire for Improvement  Sleep:  Number of Hours: 6.25   Current Medications: Current Facility-Administered Medications  Medication Dose Route Frequency Provider Last Rate Last Dose  . acetaminophen (TYLENOL) tablet 650 mg  650 mg Oral Q6H PRN Nanine Means, NP      . alum & mag hydroxide-simeth (MAALOX/MYLANTA) 200-200-20 MG/5ML suspension 30 mL  30 mL Oral Q4H PRN Nanine Means, NP      . FLUoxetine (PROZAC) capsule 10 mg  10 mg Oral Daily Himabindu Ravi, MD   10 mg at 07/03/12 0818  . magnesium hydroxide (MILK OF MAGNESIA) suspension 30 mL  30 mL Oral Daily PRN Nanine Means, NP      . ondansetron (ZOFRAN-ODT) disintegrating tablet 4 mg  4 mg Oral Q8H PRN Mickeal Skinner, MD   4 mg at 07/03/12 1145  . traZODone (DESYREL) tablet 100 mg  100 mg Oral QHS Nanine Means, NP        Lab Results: No results found for this or any previous visit (from the past 48 hour(s)).  Physical Findings: Throat clear without exudate, erythema or petechiae. AIMS: Facial and Oral Movements Muscles of Facial Expression: None, normal Lips and Perioral Area: None,  normal Jaw: None, normal Tongue: None, normal,Extremity Movements Upper (arms, wrists, hands, fingers): None, normal Lower (legs, knees, ankles, toes): None, normal, Trunk Movements Neck, shoulders, hips: None, normal, Overall Severity Severity of abnormal movements (highest score from questions above): None, normal Incapacitation due to abnormal movements: None, normal Patient's awareness of abnormal movements (rate only patient's report): No Awareness, Dental Status Current problems with teeth and/or dentures?: No Does patient usually wear dentures?: No  CIWA:    COWS:     Treatment Plan Summary: Pt will participate in groups.  She will report any problems with side effects and report any suicidal thoughts at least two days before discharge   Plan:1. Continue crisis management and stabilization. 2. Medication management to reduce current symptoms to base line and improve patient's overall level of functioning 3. Treat health problems as indicated. 4. Develop treatment plan to decrease risk of relapse upon discharge and the need for readmission. 5. Psycho-social education regarding relapse prevention and self care. 6. Health care follow up as needed for medical problems. 7. Continue home medications where appropriate. 8. Oral throat culture to r/o strep. 9. Throat coat with cepacol lozengers.   Medical Decision Making Problem Points:  Established problem, stable/improving (1) and Review of last therapy session (1) Data Points:  Review or order medicine tests (1)  I certify that inpatient services furnished can reasonably be expected to improve the patient's condition.  Rona Ravens. Kartel Wolbert RPAC 4:05 PM 07/03/2012

## 2012-07-03 NOTE — Progress Notes (Signed)
[facility] Group Notes:  (Nursing/MHT/Case Management/Adjunct)  Date:  07/03/2012  Time:  2000  Type of Therapy:  Psychoeducational Skills  Participation Level:  Minimal  Participation Quality:  Attentive  Affect:  Appropriate  Cognitive:  Appropriate  Insight:  Lacking  Engagement in Group:  Lacking  Modes of Intervention:  Education  Summary of Progress/Problems: The patient verbalized in group this evening that she spent much of the day in bed since she was dealing with nausea and a cold. Her goal for tomorrow is to get discharged from the hospital.   Hazle Coca S 07/03/2012, 9:58 PM

## 2012-07-03 NOTE — Progress Notes (Signed)
Goals  Group Note  Date: 07/03/2012 Time: 1015  Group Topic/Focus:  Defining Goals : :   The focus of this group is to help patients identify  Goals they want to strive towards as well as helping to motivate patietns to begin to make positive change in their lives.  Participation Level:  Did Not Attend  PAdditional Comments:    07/03/2012,12:46 PM Rich Brave

## 2012-07-03 NOTE — Progress Notes (Signed)
Psychoeducational Group Note  Date: 07/03/2012 Time: 1015  Group Topic/Focus:  Making Healthy Choices:   The focus of this group is to help patients identify negative/unhealthy choices they were using prior to admission and identify positive/healthier coping strategies to replace them upon discharge.  Participation Level:  Did Not Attend  PAdditional Comments:    07/03/2012,6:48 PM Rich Brave

## 2012-07-03 NOTE — Clinical Social Work Note (Signed)
[facility] Group Notes: (Clinical Social Work)   07/03/2012      Type of Therapy:  Group Therapy   Participation Level:  Did Not Attend    Mareida Grossman-Orr, LCSW 07/03/2012, 4:16 PM     

## 2012-07-03 NOTE — Progress Notes (Signed)
[facility] Group Notes:  (Nursing/MHT/Case Management/Adjunct)  Date:  07/02/2012 Time:  2000  Type of Therapy:  Psychoeducational Skills  Participation Level:  Minimal  Participation Quality:  Attentive  Affect:  Appropriate  Cognitive:  Lacking  Insight:  Improving  Engagement in Group:  Improving  Modes of Intervention:  Education  Summary of Progress/Problems: The patient was brighter in group this evening as she smiled more often than last evening, more conversational.  She stated in group that she enjoyed attending the groups. In addition, she stated that she was able to go outside with her peers. She states that she is feeling well enough to be discharged, but would not offer any further details nor did she have any specific discharge plans. Her goal for tomorrow is to stay out of bed.   GOODMAN, BENJAMIN S 07/03/2012, 1:25 AM

## 2012-07-03 NOTE — Progress Notes (Signed)
Psychoeducational Group Note  Date:  07/03/2012 Time:  1015  Group Topic/Focus:  Making Healthy Choices:   The focus of this group is to help patients identify negative/unhealthy choices they were using prior to admission and identify positive/healthier coping strategies to replace them upon discharge.  Participation Level:  Did Not Attend   Dione Housekeeper 07/03/2012

## 2012-07-03 NOTE — Progress Notes (Signed)
Writer entered patients room and observed her lying in bed asleep. Patient was easily aroused when her name was called. Writer informed patient of her trazadone due and she reports that she does not take medication for sleep. Patient has been up and active on the unit earlier, attended group this evening and has voiced no complaints. Patient currently denies having pain, -si/hi/a/v hall. Support and encouragement offered, safety maintained on unit, will continue to monitor.

## 2012-07-03 NOTE — Progress Notes (Signed)
D) Pt has been in her room much of the shift due to not feeling well. Pt vomited this morning twice and was given Zofran which calmed Pt's stomach and she was able to eat. Pt rates her depression and hopelessness both at 0 and denies SI and HI. A) Given support and provided with soup for lunch. Fluids encouraged. R) Denies SI and HI.

## 2012-07-04 MED ORDER — FLUOXETINE HCL 10 MG PO CAPS: 10.0000 mg | ORAL_CAPSULE | Freq: Every day | ORAL | Status: DC

## 2012-07-04 MED ORDER — TRAZODONE HCL 100 MG PO TABS: 100.0000 mg | ORAL_TABLET | Freq: Every day | ORAL | Status: DC

## 2012-07-04 NOTE — Progress Notes (Signed)
[facility] INPATIENT:  Family/Significant Other Suicide Prevention Education  Suicide Prevention Education:  Contact Attempts:  Kassondra Geil, Mother, [phone] has been identified by the patient as the family member/significant other with whom the patient will be residing, and identified as the person(s) who will aid the patient in the event of a mental health crisis.  With written consent from the patient, two attempts were made to provide suicide prevention education, prior to and/or following the patient's discharge.  We were unsuccessful in providing suicide prevention education.  A suicide education pamphlet was given to the patient to share with family/significant other.  Date and time of first attempt3/10/14 at 11:30 AM; 12:45 and 2:45  Message left but no call back from mother as of the time of this note. Wynn Banker 07/04/2012, 3:44 PM

## 2012-07-04 NOTE — Progress Notes (Signed)
Patient came to medication window and c/o sore throat and received a cepacol and fluids. Patient reports that she has not felt well today. Patient encouraged to rest and if needed and reminded her to keep her hands washed also. Patient voiced no other complaints, denies si/hi/a/v hallucinations. Safety maintained with 15 min checks, will continue to monitor.

## 2012-07-04 NOTE — Progress Notes (Signed)
Adult Psychoeducational Group Note  Date:  07/04/2012 Time:  1:23 PM  Group Topic/Focus:  Wellness Toolbox:   The focus of this group is to discuss various aspects of wellness, balancing those aspects and exploring ways to increase the ability to experience wellness.  Patients will create a wellness toolbox for use upon discharge.  Participation Level:  Active  Participation Quality:  Appropriate, Attentive and Sharing  Affect:  Appropriate  Cognitive:  Alert and Appropriate  Insight: Appropriate  Engagement in Group:  Engaged  Modes of Intervention:  Discussion  Additional Comments:  Pt was appropriate and attentive while attending group. Pt shared that staying on medications and working on getting her kids back is her main focus.   Sharyn Lull 07/04/2012, 1:23 PM

## 2012-07-04 NOTE — Progress Notes (Signed)
[facility] LCSW Group Therapy        Overcoming Obstacles 1:15 2:30 PM          07/04/2012 3:17 PM.  Type of Therapy:  Group Therapy  Participation Level:  Active  Participation Quality:  Appropriate and Attentive  Affect:  Appropriate  Cognitive:  Appropriate  Insight:  Engaged  Engagement in Therapy:  Engaged  Modes of Intervention: Discussion, Exploration, Rapport Building, Support  Summary of Progress/Problems:Patient shared she has to overcome is feeling better and stopping her medications.  Patient shared she wants to keep feeling as good as she does at this time.   Wynn Banker 07/04/2012, 3:17 PM

## 2012-07-04 NOTE — Tx Team (Addendum)
Interdisciplinary Treatment Plan Update   Date Reviewed:  07/04/2012  Time Reviewed:  10:39 AM  Progress in Treatment:   Attending groups: Yes Participating in groups: Yes Taking medication as prescribed: Yes  Tolerating medication: Yes Family/Significant other contact made:  No, attempts were made but did not get a call back from mother. Patient understands diagnosis: Yes  Discussing patient identified problems/goals with staff: Yes Medical problems stabilized or resolved: Yes Denies suicidal/homicidal ideation: Yes Patient has not harmed self or others: Yes  For review of initial/current patient goals, please see plan of care.  Estimated Length of Stay:  Discharge today  Reasons for Continued Hospitalization:   New Problems/Goals identified:    Discharge Plan or Barriers:   Home with outpatient follow up at [facility].  Additional Comments: Patient reports doing well and stabilized on medications.  She rates all symptoms at zero and looks forward to discharging home today.  Attendees:  Patient: [name] 07/04/2012 10:39 AM   Signature: Patrick North, MD 07/04/2012 10:39 AM  Signature:Jan Earlene Plater, RN 07/04/2012 10:39 AM  Signature: 07/04/2012 10:39 AM  Signature:Beverly Terrilee Croak, RN 07/04/2012 10:39 AM  Signature:  Fransisca Kaufmann, NOC  07/04/2012 10:39 AM  Signature:  Juline Patch, LCSW 07/04/2012 10:39 AM  Signature:  07/04/2012 10:39 AM  Signature:  07/04/2012 10:39 AM  Signature:    Signature:    Signature:    Signature:      Scribe for Treatment Team:   Juline Patch,  07/04/2012 10:39 AM

## 2012-07-04 NOTE — [facility] Suicide Risk Assessment (Signed)
Suicide Risk Assessment  Discharge Assessment     Demographic Factors:  Female, african [place], disabled  Mental Status Per Nursing Assessment::   On Admission:  Suicidal ideation indicated by patient  Current Mental Status by Physician: Patient alert and oriented to 4. Denies AH/VH/SI/HI.  Loss Factors: NA  Historical Factors: Impulsivity  Risk Reduction Factors:   Positive coping skills or problem solving skills  Continued Clinical Symptoms:  Depression:   Recent sense of peace/wellbeing  Cognitive Features That Contribute To Risk:  Cognitively intact   Suicide Risk:  Minimal: No identifiable suicidal ideation.  Patients presenting with no risk factors but with morbid ruminations; may be classified as minimal risk based on the severity of the depressive symptoms  Discharge Diagnoses:   AXIS I:  Major Depression, Recurrent severe AXIS II:  Deferred AXIS III:   Past Medical History  Diagnosis Date  . Anxiety   . Mental disorder   . Chronic tonsillitis    AXIS IV:  other psychosocial or environmental problems AXIS V:  61-70 mild symptoms  Plan Of Care/Follow-up recommendations:  Activity:  regular Diet:  regular Follow up with outpatient appointments.  Is patient on multiple antipsychotic therapies at discharge:  No   Has Patient had three or more failed trials of antipsychotic monotherapy by history:  No  Recommended Plan for Multiple Antipsychotic Therapies: NA  RAVI, HIMABINDU 07/04/2012, 10:38 AM

## 2012-07-04 NOTE — Progress Notes (Signed)
[facility] Adult Case Management Discharge Plan :  Will you be returning to the same living situation after discharge: Yes,  Patient to return to her home At discharge, do you have transportation home?:Yes,  Patient to arrange transportation home Do you have the ability to pay for your medications:Yes,  Patient has Medicaid  Release of information consent forms completed and in the chart;  Patient's signature needed at discharge.  Patient to Follow up at: Follow-up Information   Follow up with Dr.  Nolberto Hanlon On 07/08/2012. (You are scheduled with Dr. August Saucer at [facility].)    Contact information:   [address] [place], [place]   [address]  [phone]      Follow up with [facility] On 07/05/2012. (You will be seen by [facility] staff on Tuesday, July 05, 2012 before noon)    Contact information:   [address] [place], [place]   [address]  [phone]      Patient denies SI/HI:   Yes,  Patient is not endorsing SI/HI or thoughts of self harm.    Safety Planning and Suicide Prevention discussed:  Yes,  Reviewed during aftercare groups.  Wynn Banker 07/04/2012, 3:16 PM

## 2012-07-04 NOTE — Discharge Summary (Signed)
Physician Discharge Summary Note  Patient:  [name] is an 22 y.o., female MRN:  [id number] DOB:  1990-11-26 Patient phone:  [phone] (home)  Patient address:   [address] [place] [address],   Date of Admission:  06/28/2012 Date of Discharge: 07/04/2012  Reason for Admission:  Depression with suicide attempt  Discharge Diagnoses: Principal Problem:   Bipolar I disorder, most recent episode (or current) depressed, unspecified Active Problems:   Generalized anxiety disorder  Review of Systems  Constitutional: Negative.   HENT: Negative.   Eyes: Negative.   Respiratory: Negative.   Cardiovascular: Negative.   Gastrointestinal: Negative.   Genitourinary: Negative.   Musculoskeletal: Negative.   Skin: Negative.   Neurological: Negative.   Endo/Heme/Allergies: Negative.   Psychiatric/Behavioral: Positive for depression. The patient is nervous/anxious.    Axis Diagnosis:   AXIS I:  Anxiety Disorder NOS and Major Depression, Recurrent severe AXIS II:  Deferred AXIS III:   Past Medical History  Diagnosis Date  . Anxiety   . Mental disorder   . Chronic tonsillitis    AXIS IV:  other psychosocial or environmental problems, problems related to social environment and problems with primary support group AXIS V:  61-70 mild symptoms  Level of Care:  OP  Hospital Course:   On admission:  Depression "on and off", she found out that her boyfriend was cheating on her and arguing with her on her phone in front of her, "pissed me off, still calling my phone, telling me he still loves me." She got upset, started crying and shaking--a phone call yesterday set her off yesterday and she decided she was going to kill herself, took an allergy pill, got nervous and called the police who told her not to overdose. Mashell has had her Invega about 3.5 weeks ago, wants it once a month, [facility] scheduled for twice month  During hospitalization, [place] was started on Prozac for  her depression and Trazodone for her sleep issues.  She attended some groups during her stay with some participation--positive encouragement given.  Coping skills for depression developed---will use her support system next time she gets upset.  Antha is mentally and physically stable for discharge, Rx given.  She will continue her care at [facility] with Dr August Saucer.  Consults:  None  Significant Diagnostic Studies:  labs: Completed and reviewed, stable  Discharge Vitals:   Blood pressure 138/84, pulse 120, temperature 98 F (36.7 C), temperature source Oral, resp. rate 18, height 5\' 7"  (1.702 m), weight 97.977 kg (216 lb), last menstrual period 06/06/2012. Body mass index is 33.82 kg/(m^2). Lab Results:   Results for orders placed during the hospital encounter of 06/28/12 (from the past 72 hour(s))  RAPID STREP SCREEN     Status: None   Collection Time    07/03/12  6:06 PM      Result Value Range   Streptococcus, Group A Screen (Direct) NEGATIVE  NEGATIVE   Comment:            DUE TO INADEQUATE SENSITIVITY OF EIA     RAPID TESTS FOR GROUP A STREP (GAS)     IT IS RECOMMENDED THAT ALL NEGATIVE     RESULTS BE FOLLOWED BY A     GROUP A STREP PROBE.    Physical Findings: AIMS: Facial and Oral Movements Muscles of Facial Expression: None, normal Lips and Perioral Area: None, normal Jaw: None, normal Tongue: None, normal,Extremity Movements Upper (arms, wrists, hands, fingers): None, normal Lower (legs,  knees, ankles, toes): None, normal, Trunk Movements Neck, shoulders, hips: None, normal, Overall Severity Severity of abnormal movements (highest score from questions above): None, normal Incapacitation due to abnormal movements: None, normal Patient's awareness of abnormal movements (rate only patient's report): No Awareness, Dental Status Current problems with teeth and/or dentures?: No Does patient usually wear dentures?: No  CIWA:    COWS:     Psychiatric Specialty Exam: See  Psychiatric Specialty Exam and Suicide Risk Assessment completed by Attending Physician prior to discharge.  Discharge destination:  Home  Is patient on multiple antipsychotic therapies at discharge:  No   Has Patient had three or more failed trials of antipsychotic monotherapy by history:  No Recommended Plan for Multiple Antipsychotic Therapies:  N/A  Discharge Orders   Future Orders Complete By Expires     Activity as tolerated - No restrictions  As directed     Diet - low sodium heart healthy  As directed         Medication List    TAKE these medications     Indication   FLUoxetine 10 MG capsule  Commonly known as:  PROZAC  Take 1 capsule (10 mg total) by mouth daily.   Indication:  Depression     traZODone 100 MG tablet  Commonly known as:  DESYREL  Take 1 tablet (100 mg total) by mouth at bedtime. May repeat x 1 in one hour if first dose not effective   Indication:  Trouble Sleeping           Follow-up Information   Follow up with Dr.  Nolberto Hanlon On 07/08/2012. (You are scheduled with Dr. August Saucer at [facility]    Contact information:   [address] [place], [place]   [address]  [phone]      Follow-up recommendations:  Activity:  as tolerated Diet:  Low-sodium heart healthy diet  Comments:  Patient will continue her care at [facility].  Total Discharge Time:  Greater than 30 minutes.  SignedNanine Means, PMH-NP 07/04/2012, 12:05 PM

## 2012-07-05 NOTE — Discharge Summary (Signed)
Reviewed

## 2012-07-07 NOTE — Progress Notes (Signed)
Patient Discharge Instructions:  After Visit Summary (AVS):   Faxed to:  07/07/12 Discharge Summary Note:   Faxed to:  07/07/12 Psychiatric Admission Assessment Note:   Faxed to:  07/07/12 Suicide Risk Assessment - Discharge Assessment:   Faxed to:  07/07/12 Faxed/Sent to the Next Level Care provider:  07/07/12 Faxed to [facility] @ [phone] Faxed to [facility] @ [phone]  Jerelene Redden, 07/07/2012, 4:01 PM

## 2012-08-01 ENCOUNTER — Emergency Department (INDEPENDENT_AMBULATORY_CARE_PROVIDER_SITE_OTHER): Payer: Medicaid Other

## 2012-08-01 ENCOUNTER — Emergency Department (INDEPENDENT_AMBULATORY_CARE_PROVIDER_SITE_OTHER)
Admission: EM | Admit: 2012-08-01 | Discharge: 2012-08-01 | Disposition: A | Discharge status: Home / Self Care | Payer: Medicaid Other | Source: Home / Self Care

## 2012-08-01 ENCOUNTER — Encounter (HOSPITAL_COMMUNITY): Payer: Self-pay | Admitting: Emergency Medicine

## 2012-08-01 DIAGNOSIS — G8929 Other chronic pain: Secondary | ICD-10-CM

## 2012-08-01 DIAGNOSIS — R109 Unspecified abdominal pain: Secondary | ICD-10-CM

## 2012-08-01 MED ORDER — TRAMADOL HCL 50 MG PO TABS: 50.0000 mg | ORAL_TABLET | Freq: Four times a day (QID) | ORAL | Status: DC | PRN

## 2012-08-01 MED ORDER — TROLAMINE SALICYLATE 10 % EX CREA: TOPICAL_CREAM | CUTANEOUS | Status: DC | PRN

## 2012-08-01 NOTE — ED Notes (Signed)
Left hip pain, pain for 2 years.  Reports pain worsened recently.  Patient reports a fall when we had ice in the area.

## 2012-08-01 NOTE — ED Provider Notes (Signed)
History     CSN: [id number]  Arrival date & time 08/01/12  1346   First MD Initiated Contact with Patient 08/01/12 1505      Chief Complaint  Patient presents with  . Hip Pain    (Consider location/radiation/quality/duration/timing/severity/associated sxs/prior treatment) Patient is a 22 y.o. female presenting with hip pain.  Hip Pain  Hip Pain    This is a 22 year old female who fell onto her right side 2 years ago and has since been having "hip pain". The area that she points to is actually the right ischial tuberosity and right flank and back area. Her boyfriend states that the pain is so severe that at time she is crying. She often limps on that side and has significant difficulty lifting the right leg at times. At times she notices some numbness and tingling in the area as well the pain does not radiate to the abdomen groin or down her leg.  Past Medical History  Diagnosis Date  . Anxiety   . Mental disorder   . Chronic tonsillitis     Past Surgical History  Procedure Laterality Date  . Cesarean section    . Tonsillectomy N/A 06/03/2012    Procedure: TONSILLECTOMY;  Surgeon: Osborn Coho, MD;  Location: [facility];  Service: ENT;  Laterality: N/A;  . Mass excision N/A 06/03/2012    Procedure: EXCISION MASS;  Surgeon: Osborn Coho, MD;  Location: [facility];  Service: ENT;  Laterality: N/A;  Excision uvula mass    No family history on file.  History  Substance Use Topics  . Smoking status: Former Smoker -- 7 years    Quit date: 09/30/2011  . Smokeless tobacco: Never Used  . Alcohol Use: No    OB History   Grav Para Term Preterm Abortions TAB SAB Ect Mult Living                  Review of Systems  Constitutional: Negative.   HENT: Negative.   Eyes: Negative.   Respiratory: Negative.   Cardiovascular: Negative.   Gastrointestinal: Negative.   Genitourinary: Negative.   Musculoskeletal: Negative.   Skin: Negative.    Neurological: Negative.   Hematological: Negative.   Psychiatric/Behavioral: Positive for dysphoric mood. The patient is nervous/anxious.     Allergies  Penicillins  Home Medications   Current Outpatient Rx  Name  Route  Sig  Dispense  Refill  . FLUoxetine (PROZAC) 10 MG capsule   Oral   Take 1 capsule (10 mg total) by mouth daily.   30 capsule   0   . traMADol (ULTRAM) 50 MG tablet   Oral   Take 1-2 tablets (50-100 mg total) by mouth every 6 (six) hours as needed for pain.   30 tablet   1   . traZODone (DESYREL) 100 MG tablet   Oral   Take 1 tablet (100 mg total) by mouth at bedtime. May repeat x 1 in one hour if first dose not effective   60 tablet   0   . trolamine salicylate (ASPERCREME/ALOE) 10 % cream   Topical   Apply topically as needed.   85 g   0     BP 140/89  Pulse 103  Temp(Src) 98.3 F (36.8 C) (Oral)  Resp 18  SpO2 97%  LMP 07/31/2012  Physical Exam  Constitutional: She is oriented to person, place, and time. She appears well-developed and well-nourished.  HENT:  Head: Normocephalic and atraumatic.  Eyes: Conjunctivae  are normal. Pupils are equal, round, and reactive to light.  Neck: Normal range of motion. Neck supple.  Cardiovascular: Normal rate and regular rhythm.   Pulmonary/Chest: Effort normal and breath sounds normal.  Abdominal: Soft. Bowel sounds are normal.  Musculoskeletal: Normal range of motion.  Tenderness in left flank and upper hip area.   Neurological: She is alert and oriented to person, place, and time.  Skin: Skin is warm and dry.  Psychiatric: She has a normal mood and affect. Her behavior is normal.    ED Course  Procedures (including critical care time)  Labs Reviewed - No data to display No results found.   1. Left flank pain, chronic       MDM  Left hip complete and Lumbar spine xrays completed- no abnormalities noted. At this time I am recommending follow up with orthopedics, Tramadol and  Aspercreme.

## 2012-08-01 NOTE — ED Notes (Signed)
dificulty locating instructions, patient had scripts and papers in her pocket book-given to her by physician

## 2012-09-06 ENCOUNTER — Emergency Department (HOSPITAL_COMMUNITY)
Admission: EM | Admit: 2012-09-06 | Discharge: 2012-09-06 | Disposition: A | Discharge status: Home / Self Care | Payer: Medicaid Other | Attending: Emergency Medicine | Admitting: Emergency Medicine

## 2012-09-06 ENCOUNTER — Encounter (HOSPITAL_COMMUNITY): Payer: Self-pay | Admitting: Emergency Medicine

## 2012-09-06 DIAGNOSIS — Z8709 Personal history of other diseases of the respiratory system: Secondary | ICD-10-CM | POA: Insufficient documentation

## 2012-09-06 DIAGNOSIS — Z79899 Other long term (current) drug therapy: Secondary | ICD-10-CM | POA: Insufficient documentation

## 2012-09-06 DIAGNOSIS — Z3201 Encounter for pregnancy test, result positive: Secondary | ICD-10-CM | POA: Insufficient documentation

## 2012-09-06 DIAGNOSIS — N912 Amenorrhea, unspecified: Secondary | ICD-10-CM | POA: Insufficient documentation

## 2012-09-06 DIAGNOSIS — Z87891 Personal history of nicotine dependence: Secondary | ICD-10-CM | POA: Insufficient documentation

## 2012-09-06 DIAGNOSIS — Z331 Pregnant state, incidental: Secondary | ICD-10-CM

## 2012-09-06 DIAGNOSIS — Z8659 Personal history of other mental and behavioral disorders: Secondary | ICD-10-CM | POA: Insufficient documentation

## 2012-09-06 DIAGNOSIS — F411 Generalized anxiety disorder: Secondary | ICD-10-CM | POA: Insufficient documentation

## 2012-09-06 DIAGNOSIS — Z7251 High risk heterosexual behavior: Secondary | ICD-10-CM | POA: Insufficient documentation

## 2012-09-06 LAB — CBC WITH DIFFERENTIAL/PLATELET
Basophils Relative: 0 % (ref 0–1)
Eosinophils Absolute: 0.3 10*3/uL (ref 0.0–0.7)
Eosinophils Relative: 2 % (ref 0–5)
HCT: 35.7 % — ABNORMAL LOW (ref 36.0–46.0)
Hemoglobin: 12.2 g/dL (ref 12.0–15.0)
MCH: 26.9 pg (ref 26.0–34.0)
MCHC: 34.2 g/dL (ref 30.0–36.0)
MCV: 78.6 fL (ref 78.0–100.0)
Monocytes Absolute: 0.9 10*3/uL (ref 0.1–1.0)
Neutro Abs: 8.4 10*3/uL — ABNORMAL HIGH (ref 1.7–7.7)
Neutrophils Relative %: 68 % (ref 43–77)
RBC: 4.54 MIL/uL (ref 3.87–5.11)

## 2012-09-06 LAB — COMPREHENSIVE METABOLIC PANEL
ALT: 14 U/L (ref 0–35)
Alkaline Phosphatase: 37 U/L — ABNORMAL LOW (ref 39–117)
BUN: 11 mg/dL (ref 6–23)
CO2: 24 mEq/L (ref 19–32)
Calcium: 9.2 mg/dL (ref 8.4–10.5)
GFR calc Af Amer: >90 mL/min (ref >90)
GFR calc non Af Amer: >90 mL/min (ref >90)
Glucose, Bld: 81 mg/dL (ref 70–99)
Sodium: 136 mEq/L (ref 135–145)
Total Protein: 7.8 g/dL (ref 6.0–8.3)

## 2012-09-06 LAB — URINALYSIS, ROUTINE W REFLEX MICROSCOPIC
Bilirubin Urine: NEGATIVE
Hgb urine dipstick: NEGATIVE
Nitrite: NEGATIVE
Protein, ur: NEGATIVE mg/dL
Specific Gravity, Urine: 1.038 — ABNORMAL HIGH (ref 1.005–1.030)
Urobilinogen, UA: 0.2 mg/dL (ref 0.0–1.0)

## 2012-09-06 LAB — LIPASE, BLOOD: Lipase: 39 U/L (ref 11–59)

## 2012-09-06 MED ORDER — PRENATAL VITAMINS 28-0.8 MG PO TABS: 1.0000 | ORAL_TABLET | Freq: Every morning | ORAL | Status: DC

## 2012-09-06 MED ORDER — ONDANSETRON HCL 4 MG PO TABS: 4.0000 mg | ORAL_TABLET | Freq: Three times a day (TID) | ORAL | Status: DC | PRN

## 2012-09-06 NOTE — ED Provider Notes (Addendum)
History     CSN: [id number] Arrival date & time 09/06/12  1623  First MD Initiated Contact with Patient 09/06/12 1716     Chief Complaint  Patient presents with  . Emesis   HPI Comments: Pt is a 22 y.o. G2P2 presenting today With report of emesis.  She had 3 bouts of nonbloody, non-bilious emesis this has spontaneously resolved.  She denies any fevers, chills, abdominal pain, headache.  She denies any vaginal bleeding, vaginal discharge or other pelvic pain.  She's had no sick contacts and this is not occur previously.  No change in her bowel or bladder behaviors.  With further questioning she does have concerns that she may be pregnant and is here for a pregnancy test.  She reports her last menstrual period was the beginning of May (>2 weeks ago) upon further questioning, she reports she had one day of bleeding.  Last normal menstrual period (5 days of bleeding) was the beginning of April but she is unsure of either date.  She does not use any form of contraception and was not planning on becoming pregnant.   History significant for multiple psychiatric admissions most recently in March of 2014.  She is [facility]      Past Medical History  Diagnosis Date  . Anxiety   . Mental disorder   . Chronic tonsillitis     Past Surgical History  Procedure Laterality Date  . Cesarean section    . Tonsillectomy N/A 06/03/2012    Procedure: TONSILLECTOMY;  Surgeon: Osborn Coho, MD;  Location: [facility];  Service: ENT;  Laterality: N/A;  . Mass excision N/A 06/03/2012    Procedure: EXCISION MASS;  Surgeon: Osborn Coho, MD;  Location: [facility];  Service: ENT;  Laterality: N/A;  Excision uvula mass    History reviewed. No pertinent family history.  History  Substance Use Topics  . Smoking status: Former Smoker -- 7 years    Quit date: 09/30/2011  . Smokeless tobacco: Never Used  . Alcohol  Use: No    OB History   Grav Para Term Preterm Abortions TAB SAB Ect Mult Living                  Review of Systems  Constitutional: Negative for fever, chills, activity change and fatigue.  HENT: Negative.   Respiratory: Negative for cough, chest tightness, shortness of breath and wheezing.   Cardiovascular: Negative for chest pain, palpitations and leg swelling.  Gastrointestinal: Negative for vomiting, abdominal pain, diarrhea, constipation, blood in stool and abdominal distention.  Endocrine: Negative.   Genitourinary: Positive for menstrual problem. Negative for dysuria, urgency, frequency, hematuria, flank pain, vaginal bleeding, vaginal discharge, difficulty urinating, vaginal pain and pelvic pain.  Musculoskeletal: Negative.   Skin: Negative.   Allergic/Immunologic: Negative.   Neurological: Negative for syncope, speech difficulty, weakness, light-headedness, numbness and headaches.  Psychiatric/Behavioral: Positive for dysphoric mood (overall feeling better). Negative for suicidal ideas, behavioral problems, confusion and self-injury. The patient is not nervous/anxious.     Allergies  Penicillins  Home Medications   Current Outpatient Rx  Name  Route  Sig  Dispense  Refill  . FLUoxetine (PROZAC) 10 MG capsule   Oral   Take 1 capsule (10 mg total) by mouth daily.   30 capsule   0   . ondansetron (ZOFRAN) 4 MG tablet   Oral   Take 1 tablet (4 mg total) by mouth  every 8 (eight) hours as needed for nausea.   20 tablet   0   . Prenatal Vit-Fe Fumarate-FA (PRENATAL VITAMINS) 28-0.8 MG TABS   Oral   Take 1 tablet by mouth every morning.   100 tablet   2     BP 121/73  Pulse 100  Temp(Src) 98.7 F (37.1 C) (Oral)  Resp 16  SpO2 99%  Physical Exam  Nursing note and vitals reviewed. Constitutional: She appears well-developed and well-nourished. No distress.  HENT:  Head: Normocephalic and atraumatic.  Eyes: Conjunctivae are normal. Right eye exhibits no  discharge. Left eye exhibits no discharge. No scleral icterus.  Neck: No JVD present. No tracheal deviation present.  Cardiovascular: Normal rate.   Pulmonary/Chest: Effort normal. No respiratory distress.  Abdominal: Soft. She exhibits distension (obese). She exhibits no mass. There is no tenderness. There is no rebound and no guarding.  Musculoskeletal: Normal range of motion. She exhibits no edema.  Neurological: She is alert. She exhibits normal muscle tone.  Skin: Skin is warm and dry. No rash noted. She is not diaphoretic. No erythema. No pallor.  Psychiatric: Thought content normal.  Patient is withdrawn from conversation.  Appears to have impaired judgment.   Does not appear to be a danger to herself.  No evidence of SI or HI.     ED Course  Procedures (including critical care time)  Labs Reviewed  CBC WITH DIFFERENTIAL - Abnormal; Notable for the following:    WBC 12.5 (*)    HCT 35.7 (*)    Neutro Abs 8.4 (*)    All other components within normal limits  COMPREHENSIVE METABOLIC PANEL - Abnormal; Notable for the following:    Alkaline Phosphatase 37 (*)    Total Bilirubin 0.2 (*)    All other components within normal limits  URINALYSIS, ROUTINE W REFLEX MICROSCOPIC - Abnormal; Notable for the following:    Specific Gravity, Urine 1.038 (*)    Ketones, ur 15 (*)    All other components within normal limits  POCT PREGNANCY, URINE - Abnormal; Notable for the following:    Preg Test, Ur POSITIVE (*)    All other components within normal limits  LIPASE, BLOOD   No results found.   1. Pregnancy as incidental finding     MDM  Pt is a 22 y.o. G3P2 presenting with acute nausea and vomiting this morning.  Concerned she may be pregnant; confirmed + UPreg today.  Will rx prenatal and zofran.  F/u with HD ASAP.  Abdominal exam benign.  No bleeding, no pain.     Patient tolerating by mouth well. On prescription for Zofran provided.  Some evidence of dehydration on urinalysis  and encouraged by mouth fluids since tolerating well.  We'll plan to discharge home with [facility].  Given strict instructions return for any vaginal bleeding. Will need dating ultrasound given unsure of last menstrual period.  Likely estimated gestational age of [redacted] weeks.   Andrena Mews, DO 09/22/12 1539

## 2012-09-06 NOTE — ED Notes (Signed)
Pt c/o N/V x 4 days; pt sts LMP was beginning of May

## 2012-09-06 NOTE — ED Provider Notes (Signed)
I saw and evaluated the patient, reviewed the resident's note and I agree with the findings and plan.   Stacy Bucco, MD 09/06/12 2022

## 2012-09-26 NOTE — ED Provider Notes (Signed)
I saw and evaluated the patient, reviewed the resident's note and I agree with the findings and plan.   Stacy Bucco, MD 09/26/12 [phone]

## 2012-10-12 ENCOUNTER — Other Ambulatory Visit (HOSPITAL_COMMUNITY)
Admission: RE | Admit: 2012-10-12 | Discharge: 2012-10-12 | Disposition: A | Discharge status: Home / Self Care | Payer: Medicaid Other | Source: Ambulatory Visit | Attending: Advanced Practice Midwife | Admitting: Advanced Practice Midwife

## 2012-10-12 ENCOUNTER — Encounter: Payer: Self-pay | Admitting: Advanced Practice Midwife

## 2012-10-12 ENCOUNTER — Ambulatory Visit (INDEPENDENT_AMBULATORY_CARE_PROVIDER_SITE_OTHER): Payer: Medicaid Other | Admitting: Advanced Practice Midwife

## 2012-10-12 VITALS — BP 133/87 | Temp 98.9°F | Wt 228.3 lb

## 2012-10-12 DIAGNOSIS — O99340 Other mental disorders complicating pregnancy, unspecified trimester: Secondary | ICD-10-CM

## 2012-10-12 DIAGNOSIS — Z113 Encounter for screening for infections with a predominantly sexual mode of transmission: Secondary | ICD-10-CM | POA: Insufficient documentation

## 2012-10-12 DIAGNOSIS — Z331 Pregnant state, incidental: Secondary | ICD-10-CM

## 2012-10-12 DIAGNOSIS — Z01419 Encounter for gynecological examination (general) (routine) without abnormal findings: Secondary | ICD-10-CM | POA: Insufficient documentation

## 2012-10-12 DIAGNOSIS — F3130 Bipolar disorder, current episode depressed, mild or moderate severity, unspecified: Secondary | ICD-10-CM

## 2012-10-12 DIAGNOSIS — Z3490 Encounter for supervision of normal pregnancy, unspecified, unspecified trimester: Secondary | ICD-10-CM

## 2012-10-12 DIAGNOSIS — F09 Unspecified mental disorder due to known physiological condition: Secondary | ICD-10-CM

## 2012-10-12 DIAGNOSIS — R4189 Other symptoms and signs involving cognitive functions and awareness: Secondary | ICD-10-CM | POA: Insufficient documentation

## 2012-10-12 LAB — POCT URINALYSIS DIP (DEVICE)
Nitrite: NEGATIVE
Protein, ur: 30 mg/dL — AB
Specific Gravity, Urine: >=1.03 (ref 1.005–1.030)
Urobilinogen, UA: 0.2 mg/dL (ref 0.0–1.0)
pH: 6.5 (ref 5.0–8.0)

## 2012-10-12 NOTE — Progress Notes (Signed)
New OB. See other note   Subjective:    [name] is a [id number] [redacted]w[redacted]d being seen today for her first obstetrical visit.  Her obstetrical history is significant for obesity and bipolar with recent suicidal ideations after break-up with boyfriend. Patient does intend to breast feed. Pregnancy history fully reviewed.  Patient reports no complaints.  Filed Vitals:   10/12/12 1530  BP: 133/87  Temp: 98.9 F (37.2 C)  Weight: 228 lb 4.8 oz (103.556 kg)    HISTORY: OB History   Grav Para Term Preterm Abortions TAB SAB Ect Mult Living   3 2 2  0 0 0 0 0 0 2     # Outc Date GA Lbr Len/2nd Wgt Sex Del Anes PTL Lv   1 TRM 7/08 [redacted]w[redacted]d  8lb3oz(3.714kg) F LTCS EPI  Yes   2 TRM 7/10 [redacted]w[redacted]d  7lb(3.175kg) F LTCS Spinal  Yes   3 CUR              Past Medical History  Diagnosis Date  . Anxiety   . Mental disorder   . Chronic tonsillitis   . Depression    Past Surgical History  Procedure Laterality Date  . Cesarean section    . Tonsillectomy N/A 06/03/2012    Procedure: TONSILLECTOMY;  Surgeon: Osborn Coho, MD;  Location: [facility];  Service: ENT;  Laterality: N/A;  . Mass excision N/A 06/03/2012    Procedure: EXCISION MASS;  Surgeon: Osborn Coho, MD;  Location: [facility];  Service: ENT;  Laterality: N/A;  Excision uvula mass   History reviewed. No pertinent family history.   Exam    Uterus:     Pelvic Exam:    Perineum: No Hemorrhoids   Vulva: Bartholin's, Urethra, Skene's normal   Vagina:  normal mucosa, normal discharge   pH:    Cervix: nulliparous appearance   Adnexa: normal adnexa and no mass, fullness, tenderness   Bony Pelvis: gynecoid  System: Breast:  normal appearance, no masses or tenderness   Skin: normal coloration and turgor, no rashes    Neurologic: oriented, grossly non-focal   Extremities: normal strength, tone, and muscle mass   HEENT neck supple with midline trachea   Mouth/Teeth mucous membranes moist, pharynx normal  without lesions   Neck supple and no masses   Cardiovascular: regular rate and rhythm, no murmurs or gallops   Respiratory:  appears well, vitals normal, no respiratory distress, acyanotic, normal RR, ear and throat exam is normal, neck free of mass or lymphadenopathy, chest clear, no wheezing, crepitations, rhonchi, normal symmetric air entry   Abdomen: soft, non-tender; bowel sounds normal; no masses,  no organomegaly   Urinary: urethral meatus normal      Assessment:    Pregnancy: [id number] Patient Active Problem List   Diagnosis Date Noted  . Pregnancy test positive for incidental pregnancy 10/12/2012  . Bipolar I disorder, most recent episode (or current) depressed, unspecified 07/02/2012    Class: Chronic  . Generalized anxiety disorder 06/28/2012        Plan:     Initial labs drawn. Prenatal vitamins. Problem list reviewed and updated. Genetic Screening discussed Integrated Screen and Quad Screen: requested.  Ultrasound discussed; fetal survey: ordered.  Follow up in 4 weeks. 50% of 30 min visit spent on counseling and coordination of care.  Unsure dates:  Will need to order genetic screening once we know how far pregnant she is.    [facility] 10/12/2012

## 2012-10-12 NOTE — Progress Notes (Signed)
Pulse- 89 New ob packet given Weight gain 11-20lb

## 2012-10-12 NOTE — Patient Instructions (Addendum)
Pregnancy - First Trimester  During sexual intercourse, millions of sperm go into the vagina. Only 1 sperm will penetrate and fertilize the female egg while it is in the Fallopian tube. One week later, the fertilized egg implants into the wall of the uterus. An embryo begins to develop into a baby. At 6 to 8 weeks, the eyes and face are formed and the heartbeat can be seen on ultrasound. At the end of 12 weeks (first trimester), all the baby's organs are formed. Now that you are pregnant, you will want to do everything you can to have a healthy baby. Two of the most important things are to get good prenatal care and follow your caregiver's instructions. Prenatal care is all the medical care you receive before the baby's birth. It is given to prevent, find, and treat problems during the pregnancy and childbirth.  PRENATAL EXAMS  · During prenatal visits, your weight, blood pressure, and urine are checked. This is done to make sure you are healthy and progressing normally during the pregnancy.  · A pregnant woman should gain 25 to 35 pounds during the pregnancy. However, if you are overweight or underweight, your caregiver will advise you regarding your weight.  · Your caregiver will ask and answer questions for you.  · Blood work, cervical cultures, other necessary tests, and a Pap test are done during your prenatal exams. These tests are done to check on your health and the probable health of your baby. Tests are strongly recommended and done for HIV with your permission. This is the virus that causes AIDS. These tests are done because medicines can be given to help prevent your baby from being born with this infection should you have been infected without knowing it. Blood work is also used to find out your blood type, previous infections, and follow your blood levels (hemoglobin).  · Low hemoglobin (anemia) is common during pregnancy. Iron and vitamins are given to help prevent this. Later in the pregnancy, blood  tests for diabetes will be done along with any other tests if any problems develop.  · You may need other tests to make sure you and the baby are doing well.  CHANGES DURING THE FIRST TRIMESTER   Your body goes through many changes during pregnancy. They vary from person to person. Talk to your caregiver about changes you notice and are concerned about. Changes can include:  · Your menstrual period stops.  · The egg and sperm carry the genes that determine what you look like. Genes from you and your partner are forming a baby. The female genes determine whether the baby is a boy or a girl.  · Your body increases in girth and you may feel bloated.  · Feeling sick to your stomach (nauseous) and throwing up (vomiting). If the vomiting is uncontrollable, call your caregiver.  · Your breasts will begin to enlarge and become tender.  · Your nipples may stick out more and become darker.  · The need to urinate more. Painful urination may mean you have a bladder infection.  · Tiring easily.  · Loss of appetite.  · Cravings for certain kinds of food.  · At first, you may gain or lose a couple of pounds.  · You may have changes in your emotions from day to day (excited to be pregnant or concerned something may go wrong with the pregnancy and baby).  · You may have more vivid and strange dreams.  HOME CARE INSTRUCTIONS   ·   It is very important to avoid all smoking, alcohol and non-prescribed drugs during your pregnancy. These affect the formation and growth of the baby. Avoid chemicals while pregnant to ensure the delivery of a healthy infant.  · Start your prenatal visits by the 12th week of pregnancy. They are usually scheduled monthly at first, then more often in the last 2 months before delivery. Keep your caregiver's appointments. Follow your caregiver's instructions regarding medicine use, blood and lab tests, exercise, and diet.  · During pregnancy, you are providing food for you and your baby. Eat regular, well-balanced  meals. Choose foods such as meat, fish, milk and other low fat dairy products, vegetables, fruits, and whole-grain breads and cereals. Your caregiver will tell you of the ideal weight gain.  · You can help morning sickness by keeping soda crackers at the bedside. Eat a couple before arising in the morning. You may want to use the crackers without salt on them.  · Eating 4 to 5 small meals rather than 3 large meals a day also may help the nausea and vomiting.  · Drinking liquids between meals instead of during meals also seems to help nausea and vomiting.  · A physical sexual relationship may be continued throughout pregnancy if there are no other problems. Problems may be early (premature) leaking of amniotic fluid from the membranes, vaginal bleeding, or belly (abdominal) pain.  · Exercise regularly if there are no restrictions. Check with your caregiver or physical therapist if you are unsure of the safety of some of your exercises. Greater weight gain will occur in the last 2 trimesters of pregnancy. Exercising will help:  · Control your weight.  · Keep you in shape.  · Prepare you for labor and delivery.  · Help you lose your pregnancy weight after you deliver your baby.  · Wear a good support or jogging bra for breast tenderness during pregnancy. This may help if worn during sleep too.  · Ask when prenatal classes are available. Begin classes when they are offered.  · Do not use hot tubs, steam rooms, or saunas.  · Wear your seat belt when driving. This protects you and your baby if you are in an accident.  · Avoid raw meat, uncooked cheese, cat litter boxes, and soil used by cats throughout the pregnancy. These carry germs that can cause birth defects in the baby.  · The first trimester is a good time to visit your dentist for your dental health. Getting your teeth cleaned is okay. Use a softer toothbrush and brush gently during pregnancy.  · Ask for help if you have financial, counseling, or nutritional needs  during pregnancy. Your caregiver will be able to offer counseling for these needs as well as refer you for other special needs.  · Do not take any medicines or herbs unless told by your caregiver.  · Inform your caregiver if there is any mental or physical domestic violence.  · Make a list of emergency phone numbers of family, friends, hospital, and police and fire departments.  · Write down your questions. Take them to your prenatal visit.  · Do not douche.  · Do not cross your legs.  · If you have to stand for long periods of time, rotate you feet or take small steps in a circle.  · You may have more vaginal secretions that may require a sanitary pad. Do not use tampons or scented sanitary pads.  MEDICINES AND DRUG USE IN PREGNANCY  ·   Take prenatal vitamins as directed. The vitamin should contain 1 milligram of folic acid. Keep all vitamins out of reach of children. Only a couple vitamins or tablets containing iron may be fatal to a baby or young child when ingested.  · Avoid use of all medicines, including herbs, over-the-counter medicines, not prescribed or suggested by your caregiver. Only take over-the-counter or prescription medicines for pain, discomfort, or fever as directed by your caregiver. Do not use aspirin, ibuprofen, or naproxen unless directed by your caregiver.  · Let your caregiver also know about herbs you may be using.  · Alcohol is related to a number of birth defects. This includes fetal alcohol syndrome. All alcohol, in any form, should be avoided completely. Smoking will cause low birth rate and premature babies.  · Street or illegal drugs are very harmful to the baby. They are absolutely forbidden. A baby born to an addicted mother will be addicted at birth. The baby will go through the same withdrawal an adult does.  · Let your caregiver know about any medicines that you have to take and for what reason you take them.  SEEK MEDICAL CARE IF:   You have any concerns or worries during your  pregnancy. It is better to call with your questions if you feel they cannot wait, rather than worry about them.  SEEK IMMEDIATE MEDICAL CARE IF:   · An unexplained oral temperature above 102° F (38.9° C) develops, or as your caregiver suggests.  · You have leaking of fluid from the vagina (birth canal). If leaking membranes are suspected, take your temperature and inform your caregiver of this when you call.  · There is vaginal spotting or bleeding. Notify your caregiver of the amount and how many pads are used.  · You develop a bad smelling vaginal discharge with a change in the color.  · You continue to feel sick to your stomach (nauseated) and have no relief from remedies suggested. You vomit blood or coffee ground-like materials.  · You lose more than 2 pounds of weight in 1 week.  · You gain more than 2 pounds of weight in 1 week and you notice swelling of your face, hands, feet, or legs.  · You gain 5 pounds or more in 1 week (even if you do not have swelling of your hands, face, legs, or feet).  · You get exposed to German measles and have never had them.  · You are exposed to fifth disease or chickenpox.  · You develop belly (abdominal) pain. Round ligament discomfort is a common non-cancerous (benign) cause of abdominal pain in pregnancy. Your caregiver still must evaluate this.  · You develop headache, fever, diarrhea, pain with urination, or shortness of breath.  · You fall or are in a car accident or have any kind of trauma.  · There is mental or physical violence in your home.  Document Released: 04/07/2001 Document Revised: 01/06/2012 Document Reviewed: 10/09/2008  Exit[employer]® Patient Information ©2014 [employer].

## 2012-10-13 LAB — GLUCOSE TOLERANCE, 1 HOUR (50G) W/O FASTING: Glucose, 1 Hour GTT: 122 mg/dL (ref 70–140)

## 2012-10-13 LAB — HIV ANTIBODY (ROUTINE TESTING W REFLEX): HIV: NONREACTIVE

## 2012-10-14 LAB — OBSTETRIC PANEL
Basophils Absolute: 0 10*3/uL (ref 0.0–0.1)
Basophils Relative: 0 % (ref 0–1)
Eosinophils Absolute: 0.3 10*3/uL (ref 0.0–0.7)
Eosinophils Relative: 3 % (ref 0–5)
Lymphs Abs: 1.9 10*3/uL (ref 0.7–4.0)
MCH: 26.2 pg (ref 26.0–34.0)
MCHC: 33.1 g/dL (ref 30.0–36.0)
MCV: 79.1 fL (ref 78.0–100.0)
Neutrophils Relative %: 73 % (ref 43–77)
Platelets: 230 10*3/uL (ref 150–400)
RDW: 14.5 % (ref 11.5–15.5)

## 2012-10-14 LAB — CULTURE, OB URINE: Colony Count: 30000

## 2012-10-17 ENCOUNTER — Telehealth: Payer: Self-pay | Admitting: *Deleted

## 2012-10-17 ENCOUNTER — Ambulatory Visit (HOSPITAL_COMMUNITY)
Admission: RE | Admit: 2012-10-17 | Discharge: 2012-10-17 | Disposition: A | Discharge status: Home / Self Care | Payer: Medicaid Other | Source: Ambulatory Visit | Attending: Advanced Practice Midwife | Admitting: Advanced Practice Midwife

## 2012-10-17 ENCOUNTER — Ambulatory Visit (HOSPITAL_COMMUNITY): Payer: Medicaid Other

## 2012-10-17 DIAGNOSIS — Z3689 Encounter for other specified antenatal screening: Secondary | ICD-10-CM | POA: Insufficient documentation

## 2012-10-17 DIAGNOSIS — Z3490 Encounter for supervision of normal pregnancy, unspecified, unspecified trimester: Secondary | ICD-10-CM

## 2012-10-17 NOTE — Telephone Encounter (Signed)
Patient left a message requesting a call back. 

## 2012-10-17 NOTE — Telephone Encounter (Signed)
Called pt and left message to return the call to the clinics.  

## 2012-10-18 NOTE — Telephone Encounter (Signed)
Called pt and left message that this is our second attempt if she continues to have any concerns/questions to please give [place] a call back.

## 2012-10-22 ENCOUNTER — Encounter (HOSPITAL_COMMUNITY): Payer: Self-pay | Admitting: Obstetrics and Gynecology

## 2012-10-22 ENCOUNTER — Inpatient Hospital Stay (HOSPITAL_COMMUNITY)
Admission: AD | Admit: 2012-10-22 | Discharge: 2012-10-22 | Disposition: A | Discharge status: Home / Self Care | Payer: Medicaid Other | Source: Ambulatory Visit | Attending: Obstetrics and Gynecology | Admitting: Obstetrics and Gynecology

## 2012-10-22 ENCOUNTER — Inpatient Hospital Stay (HOSPITAL_COMMUNITY): Payer: Medicaid Other

## 2012-10-22 DIAGNOSIS — O4410 Placenta previa with hemorrhage, unspecified trimester: Secondary | ICD-10-CM | POA: Insufficient documentation

## 2012-10-22 DIAGNOSIS — O209 Hemorrhage in early pregnancy, unspecified: Secondary | ICD-10-CM

## 2012-10-22 DIAGNOSIS — O4401 Placenta previa specified as without hemorrhage, first trimester: Secondary | ICD-10-CM

## 2012-10-22 DIAGNOSIS — O4690 Antepartum hemorrhage, unspecified, unspecified trimester: Secondary | ICD-10-CM

## 2012-10-22 NOTE — MAU Provider Note (Signed)
History     CSN: [id number]  Arrival date and time: 10/22/12 1356   First Provider Initiated Contact with Patient 10/22/12 1510      Chief Complaint  Patient presents with  . Vaginal Bleeding   HPI21yo G 3 P 2002 presents with c/o sudden onset vaginal bleeding. It was bright red, no clots, no cramping. It was enough to saturate panties, not on clothing.    OB History   Grav Para Term Preterm Abortions TAB SAB Ect Mult Living   3 2 2  0 0 0 0 0 0 2      Past Medical History  Diagnosis Date  . Anxiety   . Mental disorder   . Chronic tonsillitis   . Depression     Past Surgical History  Procedure Laterality Date  . Cesarean section    . Tonsillectomy N/A 06/03/2012    Procedure: TONSILLECTOMY;  Surgeon: Osborn Coho, MD;  Location: [facility];  Service: ENT;  Laterality: N/A;  . Mass excision N/A 06/03/2012    Procedure: EXCISION MASS;  Surgeon: Osborn Coho, MD;  Location: [facility];  Service: ENT;  Laterality: N/A;  Excision uvula mass    History reviewed. No pertinent family history.  History  Substance Use Topics  . Smoking status: Former Smoker -- 7 years    Quit date: 09/30/2011  . Smokeless tobacco: Never Used  . Alcohol Use: No    Allergies:  Allergies  Allergen Reactions  . Penicillins Hives    Face breaks out.    No prescriptions prior to admission    Review of Systems  Constitutional: Negative for fever and chills.  Gastrointestinal: Negative for nausea, vomiting, abdominal pain, diarrhea and constipation.  Genitourinary: Negative for dysuria, urgency and frequency.       + vaginal bleeding   Physical Exam   Blood pressure 143/78, pulse 102, temperature 98.9 F (37.2 C), temperature source Oral, resp. rate 22, last menstrual period 07/31/2012.  Physical Exam  Constitutional: She is oriented to person, place, and time. She appears well-developed and well-nourished.  GI: Soft. There is no tenderness. There  is no rebound.  Genitourinary:  Pelvic exam: Vulva- Nl anatomy, skinintact, bloody  Vagina- small amt dark blood- cleaned with 2 scopettes Cx- closed Uterus- 12-14 wk size adn- nontender  Musculoskeletal: Normal range of motion.  Neurological: She is alert and oriented to person, place, and time.  Skin: Skin is warm and dry.  Psychiatric: She has a normal mood and affect. Her behavior is normal.    MAU Course  Procedures  MDM US Ob Comp Less 14 Wks  10/22/2012   *RADIOLOGY REPORT*  Clinical Data: First trimester pregnancy with vaginal bleeding.  OBSTETRIC <14 WK ULTRASOUND  Technique:  Transabdominal ultrasound was performed for evaluation of the gestation as well as the maternal uterus and adnexal regions.  Comparison:  Prior examination 10/17/2012.  Intrauterine gestational sac: Visualized Yolk sac: Not visualized. Embryo: Visualized. Cardiac Activity: Visualized. Heart Rate: 114 bpm  CRL:  6.21 cm;  12 weeks 5 days           [place] EDC: 05/01/2013  Maternal uterus/Adnexae: The placenta covers the internal cervical os.  There is a small subchorionic hematoma.  The ovaries were not further evaluated today.  There is no significant free pelvic fluid.  IMPRESSION:  1.  Low lying placenta covering the cervical os with small subchorionic hematoma. Follow-up recommended to better assess placental position. 2.  Live intrauterine gestation without  demonstrated abnormality. Measurements today correspond with a gestational age of [redacted] weeks 5 days.   Original Report Authenticated By: Carey Bullocks, M.D.     Assessment and Plan  12 5/7 wks IUP Low lying placenta covering cervical os Small subchorionic hemorrhage   PLAN:  Pelvic rest Keep next schedule appt for prenatal care in 3 wks Return with  Increased bleeding  Ohana Birdwell M. 10/22/2012, 3:15 PM

## 2012-10-22 NOTE — MAU Note (Signed)
[name] is [redacted]w[redacted]d; presents with acute onset of bright red vaginal bleeding. She receives prenatal care downstairs in the Clinic, and recently had an [place] that was "normal". Pt was standing at the bus stop and felt a gush of blood and at that time she called 911. She denies pain at this time 0/10

## 2012-10-23 NOTE — MAU Provider Note (Signed)
Attestation of Attending Supervision of Advanced Practitioner (CNM/NP): Evaluation and management procedures were performed by the Advanced Practitioner under my supervision and collaboration.  I have reviewed the Advanced Practitioner's note and chart, and I agree with the management and plan.  Wilian Kwong 10/23/2012 6:23 AM

## 2012-10-24 ENCOUNTER — Encounter: Payer: Self-pay | Admitting: *Deleted

## 2012-10-24 DIAGNOSIS — O99340 Other mental disorders complicating pregnancy, unspecified trimester: Secondary | ICD-10-CM | POA: Insufficient documentation

## 2012-11-09 ENCOUNTER — Ambulatory Visit (INDEPENDENT_AMBULATORY_CARE_PROVIDER_SITE_OTHER): Payer: Medicaid Other | Admitting: Advanced Practice Midwife

## 2012-11-09 VITALS — BP 131/85 | Temp 97.1°F | Wt 229.0 lb

## 2012-11-09 DIAGNOSIS — O99340 Other mental disorders complicating pregnancy, unspecified trimester: Secondary | ICD-10-CM

## 2012-11-09 DIAGNOSIS — F3130 Bipolar disorder, current episode depressed, mild or moderate severity, unspecified: Secondary | ICD-10-CM

## 2012-11-09 DIAGNOSIS — Z3482 Encounter for supervision of other normal pregnancy, second trimester: Secondary | ICD-10-CM

## 2012-11-09 LAB — POCT URINALYSIS DIP (DEVICE)
Leukocytes, UA: NEGATIVE
Protein, ur: NEGATIVE mg/dL
Urobilinogen, UA: 0.2 mg/dL (ref 0.0–1.0)
pH: 6 (ref 5.0–8.0)

## 2012-11-09 NOTE — Progress Notes (Signed)
FHT: 140 by ultrasound.  I have seen the patient with the resident/student and agree with the above.

## 2012-11-09 NOTE — Progress Notes (Signed)
No PIH sx. Feels like she's been having fetal movement.  MAU visit on 10/22/12 for vaginal bleeding, no bleeding since then.   No concerns at this time.

## 2012-11-09 NOTE — Progress Notes (Signed)
Pulse: 90

## 2012-11-09 NOTE — Progress Notes (Signed)
Informal [place] for FHR- 145 per PW doppler.

## 2012-11-28 ENCOUNTER — Emergency Department (HOSPITAL_COMMUNITY)
Admission: EM | Admit: 2012-11-28 | Discharge: 2012-11-28 | Disposition: A | Discharge status: Home / Self Care | Payer: Medicaid Other | Attending: Emergency Medicine | Admitting: Emergency Medicine

## 2012-11-28 ENCOUNTER — Inpatient Hospital Stay (HOSPITAL_COMMUNITY)
Admission: AD | Admit: 2012-11-28 | Discharge: 2012-11-29 | Disposition: A | Discharge status: Home / Self Care | Payer: Medicaid Other | Source: Ambulatory Visit | Attending: Obstetrics and Gynecology | Admitting: Obstetrics and Gynecology

## 2012-11-28 ENCOUNTER — Encounter (HOSPITAL_COMMUNITY): Payer: Self-pay | Admitting: *Deleted

## 2012-11-28 ENCOUNTER — Encounter (HOSPITAL_COMMUNITY): Payer: Self-pay

## 2012-11-28 DIAGNOSIS — Z8659 Personal history of other mental and behavioral disorders: Secondary | ICD-10-CM | POA: Insufficient documentation

## 2012-11-28 DIAGNOSIS — R109 Unspecified abdominal pain: Secondary | ICD-10-CM | POA: Insufficient documentation

## 2012-11-28 DIAGNOSIS — R197 Diarrhea, unspecified: Secondary | ICD-10-CM

## 2012-11-28 DIAGNOSIS — Z87891 Personal history of nicotine dependence: Secondary | ICD-10-CM | POA: Insufficient documentation

## 2012-11-28 DIAGNOSIS — O99891 Other specified diseases and conditions complicating pregnancy: Secondary | ICD-10-CM | POA: Insufficient documentation

## 2012-11-28 DIAGNOSIS — Z880 Allergy status to penicillin: Secondary | ICD-10-CM | POA: Insufficient documentation

## 2012-11-28 DIAGNOSIS — O9989 Other specified diseases and conditions complicating pregnancy, childbirth and the puerperium: Secondary | ICD-10-CM | POA: Insufficient documentation

## 2012-11-28 DIAGNOSIS — O99340 Other mental disorders complicating pregnancy, unspecified trimester: Secondary | ICD-10-CM

## 2012-11-28 DIAGNOSIS — Z8709 Personal history of other diseases of the respiratory system: Secondary | ICD-10-CM | POA: Insufficient documentation

## 2012-11-28 LAB — CBC WITH DIFFERENTIAL/PLATELET
Basophils Absolute: 0 10*3/uL (ref 0.0–0.1)
Basophils Relative: 0 % (ref 0–1)
Eosinophils Relative: 1 % (ref 0–5)
HCT: 32.5 % — ABNORMAL LOW (ref 36.0–46.0)
MCHC: 34.5 g/dL (ref 30.0–36.0)
MCV: 78.7 fL (ref 78.0–100.0)
Monocytes Absolute: 1.2 10*3/uL — ABNORMAL HIGH (ref 0.1–1.0)
RDW: 14.1 % (ref 11.5–15.5)

## 2012-11-28 LAB — COMPREHENSIVE METABOLIC PANEL
AST: 12 U/L (ref 0–37)
Albumin: 2.9 g/dL — ABNORMAL LOW (ref 3.5–5.2)
Calcium: 9 mg/dL (ref 8.4–10.5)
Creatinine, Ser: 0.66 mg/dL (ref 0.50–1.10)
Total Protein: 7.1 g/dL (ref 6.0–8.3)

## 2012-11-28 LAB — URINALYSIS, ROUTINE W REFLEX MICROSCOPIC
Glucose, UA: NEGATIVE mg/dL
Leukocytes, UA: NEGATIVE
Protein, ur: 30 mg/dL — AB
pH: 6 (ref 5.0–8.0)

## 2012-11-28 LAB — URINE MICROSCOPIC-ADD ON

## 2012-11-28 NOTE — ED Provider Notes (Signed)
CSN: [id number]     Arrival date & time 11/28/12  1747 History     First MD Initiated Contact with Patient 11/28/12 2042     Chief Complaint  Patient presents with  . Abdominal Pain   (Consider location/radiation/quality/duration/timing/severity/associated sxs/prior Treatment) HPI Patient reports intermittent sharp lower abdominal discomfort followed by diarrhea over the past 24 hours.  She denies nausea and vomiting.  No fevers or chills.  She is currently G3 P2 17 weeks and 6 days pregnant.  She has had a normal intrauterine pregnancy noted on prior first trimester ultrasound.  She is followed at [facility] clinic.  She denies melena or hematochezia.  No dysuria or urinary frequency.  No vaginal bleeding or vaginal spotting.  She's had no loss of fluid.  She continues to feel the baby move.  She has no other complaints at this time.  Her symptoms are mild in severity.  She did not call her OB/GYN. Past Medical History  Diagnosis Date  . Anxiety   . Mental disorder   . Chronic tonsillitis   . Depression    Past Surgical History  Procedure Laterality Date  . Cesarean section    . Tonsillectomy N/A 06/03/2012    Procedure: TONSILLECTOMY;  Surgeon: Osborn Coho, MD;  Location: [facility];  Service: ENT;  Laterality: N/A;  . Mass excision N/A 06/03/2012    Procedure: EXCISION MASS;  Surgeon: Osborn Coho, MD;  Location: [facility];  Service: ENT;  Laterality: N/A;  Excision uvula mass   History reviewed. No pertinent family history. History  Substance Use Topics  . Smoking status: Former Smoker -- 7 years    Quit date: 09/30/2011  . Smokeless tobacco: Never Used  . Alcohol Use: No   OB History   Grav Para Term Preterm Abortions TAB SAB Ect Mult Living   3 2 2  0 0 0 0 0 0 2     Review of Systems  All other systems reviewed and are negative.    Allergies  Penicillins  Home Medications  No current outpatient prescriptions on file. BP 137/68   Pulse 117  Temp(Src) 99.8 F (37.7 C) (Oral)  Resp 20  SpO2 96%  LMP 07/31/2012 Physical Exam  Nursing note and vitals reviewed. Constitutional: She is oriented to person, place, and time. She appears well-developed and well-nourished. No distress.  HENT:  Head: Normocephalic and atraumatic.  Eyes: EOM are normal.  Neck: Normal range of motion.  Cardiovascular: Normal rate, regular rhythm and normal heart sounds.   Pulmonary/Chest: Effort normal and breath sounds normal.  Abdominal: Soft. She exhibits no distension. There is no tenderness.  Gravid uterus palpable below the level of the umbilicus  Musculoskeletal: Normal range of motion.  Neurological: She is alert and oriented to person, place, and time.  Skin: Skin is warm and dry.  Psychiatric: She has a normal mood and affect. Judgment normal.    ED Course   Procedures (including critical care time)  Labs Reviewed  CBC WITH DIFFERENTIAL - Abnormal; Notable for the following:    WBC 13.0 (*)    Hemoglobin 11.2 (*)    HCT 32.5 (*)    Neutrophils Relative % 80 (*)    Neutro Abs 10.5 (*)    Lymphocytes Relative 10 (*)    Monocytes Absolute 1.2 (*)    All other components within normal limits  COMPREHENSIVE METABOLIC PANEL - Abnormal; Notable for the following:    Sodium 134 (*)  Potassium 3.4 (*)    Albumin 2.9 (*)    Alkaline Phosphatase 38 (*)    Total Bilirubin 0.2 (*)    All other components within normal limits  URINALYSIS, ROUTINE W REFLEX MICROSCOPIC - Abnormal; Notable for the following:    Color, Urine AMBER (*)    APPearance CLOUDY (*)    Specific Gravity, Urine 1.031 (*)    Bilirubin Urine SMALL (*)    Ketones, ur >80 (*)    Protein, ur 30 (*)    All other components within normal limits  URINE MICROSCOPIC-ADD ON - Abnormal; Notable for the following:    Squamous Epithelial / LPF FEW (*)    Bacteria, UA MANY (*)    Crystals CA OXALATE CRYSTALS (*)    All other components within normal limits  URINE  CULTURE  LIPASE, BLOOD   No results found. 1. Diarrhea     MDM  9:35 PM Fetal heart tones 145.  Diarrhea without nausea or vomiting.  Discharge home in good condition.  Urine culture sent.  Likely contaminant.  No urinary symptoms.  No upper abdominal pain or discomfort.  Doubt biliary colic.  I specifically asked her for pain is worse with food she stated no to me.  This contradicts what nursing report states.  Lyanne Co, MD 11/28/12 2137

## 2012-11-28 NOTE — ED Notes (Signed)
Pt c/o generalized abd pain that increases after eating, hot/cold chills and diarrhea x2 days. Pt denies N/V. Pt reports she is four months pregnant. Pt denies abnormal vaginal bleeding or problems with urinating.

## 2012-11-28 NOTE — MAU Note (Addendum)
PT SAYS  SHE HAD DIARRHEA - LOOSE YESTERDAY X3  AND 3X TODAY.   YESTERDAY FELT HOT/ COLD- DOESN'T THINK  HAD FEVER.   SAYS  ALL  ABD HURTS- STARTED YESTERDAY.  SAYS NECK HURTS - STARTED TODAY  AT 430PM-   BOTH SIDE UNDER EARS  .   HAS NOT TAKEN ANY MED FOR PAIN.  GETS  PNC- AT CLINIC.   LAST SEX-   June.    WENT TO [facility] TONIGHT - LEFT THERE  AT 945PM.  - DID NOT GIVE ANYTHING FOR PAIN- AND DX- DIARRHEA.

## 2012-11-29 ENCOUNTER — Encounter (HOSPITAL_COMMUNITY): Payer: Self-pay | Admitting: *Deleted

## 2012-11-29 MED ORDER — LOPERAMIDE HCL 2 MG PO CAPS: 2.0000 mg | ORAL_CAPSULE | Freq: Four times a day (QID) | ORAL | Status: DC | PRN

## 2012-11-29 NOTE — MAU Provider Note (Signed)
History     CSN: [id number]  Arrival date and time: 11/28/12 2251   First Provider Initiated Contact with Patient 11/29/12 0033      No chief complaint on file.  HPI [name] is a 22 y.o. G3P2002 at [redacted]w[redacted]d who presents to MAU today with complaint of diarrhea since yesterday and abdominal cramping. She denies cramping at this time. States that it comes and goes and is mild-moderate. It is diffuse, but noted more "in the middle." She denies fever, vaginal bleeding or N/V. She is a [facility] clinic patient and has next follow-up appointment on 12/07/12.    OB History   Grav Para Term Preterm Abortions TAB SAB Ect Mult Living   3 2 2  0 0 0 0 0 0 2      Past Medical History  Diagnosis Date  . Anxiety   . Mental disorder   . Chronic tonsillitis   . Depression     Past Surgical History  Procedure Laterality Date  . Cesarean section    . Tonsillectomy N/A 06/03/2012    Procedure: TONSILLECTOMY;  Surgeon: Osborn Coho, MD;  Location: [facility];  Service: ENT;  Laterality: N/A;  . Mass excision N/A 06/03/2012    Procedure: EXCISION MASS;  Surgeon: Osborn Coho, MD;  Location: [facility];  Service: ENT;  Laterality: N/A;  Excision uvula mass    History reviewed. No pertinent family history.  History  Substance Use Topics  . Smoking status: Former Smoker -- 7 years    Quit date: 09/30/2011  . Smokeless tobacco: Never Used  . Alcohol Use: No    Allergies:  Allergies  Allergen Reactions  . Penicillins Hives    Face breaks out.    No prescriptions prior to admission    Review of Systems  Constitutional: Negative for fever and malaise/fatigue.  Gastrointestinal: Positive for abdominal pain and diarrhea. Negative for nausea, vomiting and constipation.  Genitourinary: Negative for dysuria, urgency and frequency.       Neg - vaginal bleeding   Physical Exam   Blood pressure 121/72, pulse 115, temperature 98.4 F (36.9 C), temperature  source Oral, resp. rate 20, height 5\' 5"  (1.651 m), weight 229 lb (103.874 kg), last menstrual period 07/31/2012.  Physical Exam  Constitutional: She is oriented to person, place, and time. She appears well-developed and well-nourished. No distress.  HENT:  Head: Normocephalic and atraumatic.  Cardiovascular: Normal rate, regular rhythm and normal heart sounds.   Respiratory: Effort normal and breath sounds normal. No respiratory distress.  GI: Soft. Bowel sounds are normal. She exhibits no distension and no mass. There is tenderness (mild tenderness to palpation of the LLQ). There is no rebound and no guarding.  Neurological: She is alert and oriented to person, place, and time.  Skin: Skin is warm and dry. No erythema.  Psychiatric: Her speech is delayed. She is slowed.   Results for orders placed during the hospital encounter of 11/28/12 (from the past 24 hour(s))  CBC WITH DIFFERENTIAL     Status: Abnormal   Collection Time    11/28/12  6:52 PM      Result Value Range   WBC 13.0 (*) 4.0 - 10.5 K/uL   RBC 4.13  3.87 - 5.11 MIL/uL   Hemoglobin 11.2 (*) 12.0 - 15.0 g/dL   HCT 40.9 (*) 81.1 - 91.4 %   MCV 78.7  78.0 - 100.0 fL   MCH 27.1  26.0 - 34.0 pg  MCHC 34.5  30.0 - 36.0 g/dL   RDW 40.9  81.1 - 91.4 %   Platelets 183  150 - 400 K/uL   Neutrophils Relative % 80 (*) 43 - 77 %   Neutro Abs 10.5 (*) 1.7 - 7.7 K/uL   Lymphocytes Relative 10 (*) 12 - 46 %   Lymphs Abs 1.2  0.7 - 4.0 K/uL   Monocytes Relative 9  3 - 12 %   Monocytes Absolute 1.2 (*) 0.1 - 1.0 K/uL   Eosinophils Relative 1  0 - 5 %   Eosinophils Absolute 0.1  0.0 - 0.7 K/uL   Basophils Relative 0  0 - 1 %   Basophils Absolute 0.0  0.0 - 0.1 K/uL  COMPREHENSIVE METABOLIC PANEL     Status: Abnormal   Collection Time    11/28/12  6:52 PM      Result Value Range   Sodium 134 (*) 135 - 145 mEq/L   Potassium 3.4 (*) 3.5 - 5.1 mEq/L   Chloride 102  96 - 112 mEq/L   CO2 23  19 - 32 mEq/L   Glucose, Bld 96  70 - 99  mg/dL   BUN 9  6 - 23 mg/dL   Creatinine, Ser 7.82  0.50 - 1.10 mg/dL   Calcium 9.0  8.4 - 95.6 mg/dL   Total Protein 7.1  6.0 - 8.3 g/dL   Albumin 2.9 (*) 3.5 - 5.2 g/dL   AST 12  0 - 37 U/L   ALT 8  0 - 35 U/L   Alkaline Phosphatase 38 (*) 39 - 117 U/L   Total Bilirubin 0.2 (*) 0.3 - 1.2 mg/dL   GFR calc non Af Amer >90  >90 mL/min   GFR calc Af Amer >90  >90 mL/min  LIPASE, BLOOD     Status: None   Collection Time    11/28/12  6:52 PM      Result Value Range   Lipase 16  11 - 59 U/L  URINALYSIS, ROUTINE W REFLEX MICROSCOPIC     Status: Abnormal   Collection Time    11/28/12  8:51 PM      Result Value Range   Color, Urine AMBER (*) YELLOW   APPearance CLOUDY (*) CLEAR   Specific Gravity, Urine 1.031 (*) 1.005 - 1.030   pH 6.0  5.0 - 8.0   Glucose, UA NEGATIVE  NEGATIVE mg/dL   Hgb urine dipstick NEGATIVE  NEGATIVE   Bilirubin Urine SMALL (*) NEGATIVE   Ketones, ur >80 (*) NEGATIVE mg/dL   Protein, ur 30 (*) NEGATIVE mg/dL   Urobilinogen, UA 0.2  0.0 - 1.0 mg/dL   Nitrite NEGATIVE  NEGATIVE   Leukocytes, UA NEGATIVE  NEGATIVE  URINE MICROSCOPIC-ADD ON     Status: Abnormal   Collection Time    11/28/12  8:51 PM      Result Value Range   Squamous Epithelial / LPF FEW (*) RARE   WBC, UA 3-6  <3 WBC/hpf   RBC / HPF 3-6  <3 RBC/hpf   Bacteria, UA MANY (*) RARE   Crystals CA OXALATE CRYSTALS (*) NEGATIVE   Urine-Other MUCOUS PRESENT      MAU Course  Procedures None  MDM Patient was seen at [facility] today and diagnosed with diarrhea. Patient states that they did not explain to her what was wrong or give her anything for pain.  Patient has diagnosis of mental disorder on her chart from previous visit. Appears to have  some level of MR  Assessment and Plan  A: Diarrhea  P: Discharge home Rx for imodium sent to patient's pharmacy Patient advised to increase PO hydration as tolerated Patient encouraged to keep appointment for routine prenatal care as scheduled with [facility]  clinic Patient may return to MAU as needed or if her condition were to change or worsen  Freddi Starr, PA-C  11/29/2012, 12:33 AM

## 2012-11-30 LAB — URINE CULTURE: Colony Count: NO GROWTH

## 2012-11-30 NOTE — MAU Provider Note (Signed)
Attestation of Attending Supervision of Advanced Practitioner (CNM/NP): Evaluation and management procedures were performed by the Advanced Practitioner under my supervision and collaboration.  I have reviewed the Advanced Practitioner's note and chart, and I agree with the management and plan.  Jesica Goheen 11/30/2012 10:29 AM   

## 2012-12-01 ENCOUNTER — Telehealth: Payer: Self-pay | Admitting: Obstetrics and Gynecology

## 2012-12-01 NOTE — Telephone Encounter (Signed)
Patient called requesting a return call back. Called patient back; no answer. Left message to call us back for further assistance

## 2012-12-02 NOTE — Telephone Encounter (Signed)
Called pt and left message that I am returning her call again. If she still has a question or problem, please leave a new message on the nurse voice mail. Please also state whether we may leave detailed medical information and advice on her voice mail in our return call.

## 2012-12-06 ENCOUNTER — Encounter: Payer: Self-pay | Admitting: *Deleted

## 2012-12-07 ENCOUNTER — Ambulatory Visit (INDEPENDENT_AMBULATORY_CARE_PROVIDER_SITE_OTHER): Payer: Medicaid Other | Admitting: Advanced Practice Midwife

## 2012-12-07 VITALS — BP 125/77 | Wt 226.7 lb

## 2012-12-07 DIAGNOSIS — Z3492 Encounter for supervision of normal pregnancy, unspecified, second trimester: Secondary | ICD-10-CM

## 2012-12-07 DIAGNOSIS — O99340 Other mental disorders complicating pregnancy, unspecified trimester: Secondary | ICD-10-CM

## 2012-12-07 LAB — POCT URINALYSIS DIP (DEVICE)
Glucose, UA: NEGATIVE mg/dL
Hgb urine dipstick: NEGATIVE
Specific Gravity, Urine: 1.025 (ref 1.005–1.030)

## 2012-12-07 NOTE — Progress Notes (Signed)
She has started to feel the baby move. Needs to have anatomy scan. Was seen in MAU for diarrhea and neck pain. She is feeling better now.

## 2012-12-07 NOTE — Progress Notes (Signed)
Pulse: 104

## 2012-12-13 ENCOUNTER — Ambulatory Visit (HOSPITAL_COMMUNITY)
Admission: RE | Admit: 2012-12-13 | Discharge: 2012-12-13 | Disposition: A | Discharge status: Home / Self Care | Payer: Medicaid Other | Source: Ambulatory Visit | Attending: Advanced Practice Midwife | Admitting: Advanced Practice Midwife

## 2012-12-13 DIAGNOSIS — O99340 Other mental disorders complicating pregnancy, unspecified trimester: Secondary | ICD-10-CM

## 2012-12-13 DIAGNOSIS — Z3492 Encounter for supervision of normal pregnancy, unspecified, second trimester: Secondary | ICD-10-CM

## 2012-12-13 DIAGNOSIS — E669 Obesity, unspecified: Secondary | ICD-10-CM | POA: Insufficient documentation

## 2012-12-13 DIAGNOSIS — Z3689 Encounter for other specified antenatal screening: Secondary | ICD-10-CM | POA: Insufficient documentation

## 2012-12-13 DIAGNOSIS — O34219 Maternal care for unspecified type scar from previous cesarean delivery: Secondary | ICD-10-CM | POA: Insufficient documentation

## 2012-12-28 ENCOUNTER — Telehealth: Payer: Self-pay | Admitting: General Practice

## 2012-12-28 ENCOUNTER — Inpatient Hospital Stay (HOSPITAL_COMMUNITY)
Admission: AD | Admit: 2012-12-28 | Discharge: 2012-12-28 | Disposition: A | Discharge status: Home / Self Care | Payer: Medicaid Other | Source: Ambulatory Visit | Attending: Family Medicine | Admitting: Family Medicine

## 2012-12-28 ENCOUNTER — Encounter (HOSPITAL_COMMUNITY): Payer: Self-pay | Admitting: *Deleted

## 2012-12-28 DIAGNOSIS — O99891 Other specified diseases and conditions complicating pregnancy: Secondary | ICD-10-CM | POA: Insufficient documentation

## 2012-12-28 DIAGNOSIS — H9209 Otalgia, unspecified ear: Secondary | ICD-10-CM

## 2012-12-28 DIAGNOSIS — H9201 Otalgia, right ear: Secondary | ICD-10-CM

## 2012-12-28 MED ORDER — PSEUDOEPHEDRINE HCL 60 MG PO TABS: 60.0000 mg | ORAL_TABLET | ORAL | Status: DC | PRN

## 2012-12-28 NOTE — Telephone Encounter (Signed)
Patient called and left message to call back, it was very difficult to understand patient due to cell service

## 2012-12-28 NOTE — MAU Provider Note (Signed)
Chief Complaint:  ear problems    None     HPI: [name] is a 22 y.o. G3P2002 at [redacted]w[redacted]d who presents to maternity admissions reporting discomfort of her right ear.  She denies pain, but reports fullness, feeling like "air is blowing through" and popping of her right ear x2 weeks, since she started an upper respiratory infection.  She denies symptoms today of her URI and reports these are better.  She reports daily fetal movement, denies LOF, vaginal bleeding, vaginal itching/burning, urinary symptoms, h/a, dizziness, n/v, or fever/chills.  .   Past Medical History: Past Medical History  Diagnosis Date  . Anxiety   . Mental disorder   . Chronic tonsillitis   . Depression     Past obstetric history: OB History  Gravida Para Term Preterm AB SAB TAB Ectopic Multiple Living  3 2 2  0 0 0 0 0 0 2    # Outcome Date GA Lbr Len/2nd Weight Sex Delivery Anes PTL Lv  3 CUR           2 TRM 11/16/08 [redacted]w[redacted]d  3.175 kg (7 lb) F LTCS Spinal  Y  1 TRM 11/13/06 [redacted]w[redacted]d  3.714 kg (8 lb 3 oz) F LTCS EPI  Y      Past Surgical History: Past Surgical History  Procedure Laterality Date  . Cesarean section    . Tonsillectomy N/A 06/03/2012    Procedure: TONSILLECTOMY;  Surgeon: Osborn Coho, MD;  Location: [facility];  Service: ENT;  Laterality: N/A;  . Mass excision N/A 06/03/2012    Procedure: EXCISION MASS;  Surgeon: Osborn Coho, MD;  Location: [facility];  Service: ENT;  Laterality: N/A;  Excision uvula mass    Family History: History reviewed. No pertinent family history.  Social History: History  Substance Use Topics  . Smoking status: Former Smoker -- 7 years    Quit date: 09/30/2011  . Smokeless tobacco: Never Used  . Alcohol Use: No    Allergies:  Allergies  Allergen Reactions  . Penicillins Hives    Face breaks out.    Meds:  Prescriptions prior to admission  Medication Sig Dispense Refill  . acetaminophen (TYLENOL) 500 MG tablet Take 500  mg by mouth every 6 (six) hours as needed for pain.      . [DISCONTINUED] loperamide (IMODIUM) 2 MG capsule Take 1 capsule (2 mg total) by mouth 4 (four) times daily as needed for diarrhea or loose stools.  12 capsule  0    ROS: Pertinent findings in history of present illness.  Physical Exam  Blood pressure 124/70, pulse 105, temperature 98.7 F (37.1 C), temperature source Oral, resp. rate 18, weight 102.059 kg (225 lb), last menstrual period 07/31/2012. GENERAL: Well-developed, well-nourished female in no acute distress.  HEENT: normocephalic HEART: normal rate RESP: normal effort ABDOMEN: Soft, non-tender, gravid appropriate for gestational age EXTREMITIES: Nontender, no edema NEURO: alert and oriented Physical Examination: Ears - bilateral TM's and external ear canals normal   FHR 150 by doppler    Assessment: 1. Ear discomfort, right   2.  Recent URI  Plan: Discharge home Sudafed 60 mg Q4 hours x24 hours Saline rinses of ear for discomfort F/U with primary care provider      Medication List    ASK your doctor about these medications       acetaminophen 500 MG tablet  Commonly known as:  TYLENOL  Take 500 mg by mouth every 6 (six) hours as  needed for pain.     loperamide 2 MG capsule  Commonly known as:  IMODIUM  Take 1 capsule (2 mg total) by mouth 4 (four) times daily as needed for diarrhea or loose stools.        Sharen Counter Certified Nurse-Midwife 12/28/2012 3:40 PM

## 2012-12-28 NOTE — MAU Note (Addendum)
Thinks there is fluid in her rt ear, sounds like the wind is blowing.  Has been going on for 2 wks.   Denies any pain in ear or sore throat.  Just got over a cold, had it when this started

## 2012-12-30 ENCOUNTER — Emergency Department (HOSPITAL_COMMUNITY)
Admission: EM | Admit: 2012-12-30 | Discharge: 2012-12-30 | Disposition: A | Discharge status: Home / Self Care | Payer: Medicaid Other | Attending: Emergency Medicine | Admitting: Emergency Medicine

## 2012-12-30 DIAGNOSIS — Z8659 Personal history of other mental and behavioral disorders: Secondary | ICD-10-CM | POA: Insufficient documentation

## 2012-12-30 DIAGNOSIS — H9319 Tinnitus, unspecified ear: Secondary | ICD-10-CM | POA: Insufficient documentation

## 2012-12-30 DIAGNOSIS — H6980 Other specified disorders of Eustachian tube, unspecified ear: Secondary | ICD-10-CM | POA: Insufficient documentation

## 2012-12-30 DIAGNOSIS — H6981 Other specified disorders of Eustachian tube, right ear: Secondary | ICD-10-CM

## 2012-12-30 DIAGNOSIS — J3489 Other specified disorders of nose and nasal sinuses: Secondary | ICD-10-CM | POA: Insufficient documentation

## 2012-12-30 DIAGNOSIS — Z87891 Personal history of nicotine dependence: Secondary | ICD-10-CM | POA: Insufficient documentation

## 2012-12-30 DIAGNOSIS — Z8709 Personal history of other diseases of the respiratory system: Secondary | ICD-10-CM | POA: Insufficient documentation

## 2012-12-30 DIAGNOSIS — O9989 Other specified diseases and conditions complicating pregnancy, childbirth and the puerperium: Secondary | ICD-10-CM | POA: Insufficient documentation

## 2012-12-30 DIAGNOSIS — H6990 Unspecified Eustachian tube disorder, unspecified ear: Secondary | ICD-10-CM | POA: Insufficient documentation

## 2012-12-30 NOTE — ED Notes (Signed)
Reports right ear sounds like air blowing in it.  Denies pain, "just irritating"

## 2012-12-30 NOTE — ED Provider Notes (Signed)
CSN: [id number]     Arrival date & time 12/30/12  1717 History  This chart was scribed for non-physician practitioner Stacy Morn, NP working with Stacy Sou, MD by [name], ED Scribe and [name], ED scribe. This patient was seen in room TR09C/TR09C and the patient's care was started at 5:33 PM.    Chief Complaint  Patient presents with  . Ear Problem   The history is provided by the patient. No language interpreter was used.   HPI Comments: [name] is a 22 y.o. female who presents to the Emergency Department complaining of noise in her right ear that sounds like wind blowing. Pt had a cold recently and still has sinus congestion. She was given Sudafed at the [facility] with no relief. Pt denies sore throat. Pt is 20-[redacted] weeks pregnant.    Past Medical History  Diagnosis Date  . Anxiety   . Mental disorder   . Chronic tonsillitis   . Depression    Past Surgical History  Procedure Laterality Date  . Cesarean section    . Tonsillectomy N/A 06/03/2012    Procedure: TONSILLECTOMY;  Surgeon: Osborn Coho, MD;  Location: [facility];  Service: ENT;  Laterality: N/A;  . Mass excision N/A 06/03/2012    Procedure: EXCISION MASS;  Surgeon: Osborn Coho, MD;  Location: [facility];  Service: ENT;  Laterality: N/A;  Excision uvula mass   No family history on file. History  Substance Use Topics  . Smoking status: Former Smoker -- 7 years    Quit date: 09/30/2011  . Smokeless tobacco: Never Used  . Alcohol Use: No   OB History   Grav Para Term Preterm Abortions TAB SAB Ect Mult Living   3 2 2  0 0 0 0 0 0 2     Review of Systems  HENT: Positive for congestion and tinnitus. Negative for sore throat.   All other systems reviewed and are negative.    Allergies  Penicillins  Home Medications   Current Outpatient Rx  Name  Route  Sig  Dispense  Refill  . acetaminophen (TYLENOL) 500 MG tablet   Oral   Take 500 mg by mouth  every 6 (six) hours as needed for pain.         . pseudoephedrine (SUDAFED) 60 MG tablet   Oral   Take 1 tablet (60 mg total) by mouth every 4 (four) hours as needed for congestion.   30 tablet   0    BP 140/79  Pulse 109  Temp(Src) 98.7 F (37.1 C)  Resp 16  Ht 5\' 6"  (1.676 m)  Wt 227 lb (102.967 kg)  BMI 36.66 kg/m2  SpO2 98%  LMP 07/31/2012  Physical Exam  Nursing note and vitals reviewed. Constitutional: She is oriented to person, place, and time. She appears well-developed and well-nourished. No distress.  HENT:  Head: Normocephalic and atraumatic.  Right Ear: Tympanic membrane normal.  Left Ear: Tympanic membrane normal.  No lymphadenopathy.   Eyes: EOM are normal.  Neck: Normal range of motion. Neck supple. No tracheal deviation present.  Cardiovascular: Normal rate, regular rhythm and normal heart sounds.   Pulmonary/Chest: Effort normal and breath sounds normal. No respiratory distress. She has no wheezes. She has no rales.  Musculoskeletal: Normal range of motion.  Lymphadenopathy:    She has no cervical adenopathy.  Neurological: She is alert and oriented to person, place, and time.  Skin: Skin is warm and dry.  Psychiatric: She has a normal mood and affect. Her behavior is normal.    ED Course  Procedures (including critical care time) DIAGNOSTIC STUDIES: Oxygen Saturation is 98% on room air, normal by my interpretation.    COORDINATION OF CARE: 5:45 PM-Discussed treatment plan which includes advising pt to use saline nasal spray and sudafed for congestion with pt at bedside and pt agreed to plan.     Labs Review Labs Reviewed - No data to display Imaging Review No results found. Patient seen two days at [facility] ago for same complaint.  TM's clear, not red or bulging. No fever. No sore throat.  Nasal congestion.  Did not try the sudafed as recommended.  Explained to the patient the limitations in treating with certain medications due to  pregnancy risk profile. MDM   Eustachian tube dysfunction     I personally performed the services described in this documentation, which was scribed in my presence. The recorded information has been reviewed and is accurate.   [name], NP 12/30/12 2039

## 2012-12-31 NOTE — ED Provider Notes (Signed)
Medical screening examination/treatment/procedure(s) were performed by non-physician practitioner and as supervising physician I was immediately available for consultation/collaboration.  Doug Sou, MD 12/31/12 0030

## 2012-12-31 NOTE — MAU Provider Note (Signed)
Chart reviewed and agree with management and plan.  

## 2013-01-02 NOTE — Telephone Encounter (Signed)
Called patient, no answer- left message that we are returning your phone call and to call us back at the clinics 

## 2013-01-04 ENCOUNTER — Ambulatory Visit (INDEPENDENT_AMBULATORY_CARE_PROVIDER_SITE_OTHER): Payer: Medicaid Other | Admitting: Advanced Practice Midwife

## 2013-01-04 VITALS — BP 120/74 | Temp 97.3°F | Wt 224.9 lb

## 2013-01-04 DIAGNOSIS — Z331 Pregnant state, incidental: Secondary | ICD-10-CM

## 2013-01-04 DIAGNOSIS — O99340 Other mental disorders complicating pregnancy, unspecified trimester: Secondary | ICD-10-CM

## 2013-01-04 LAB — US OB LIMITED

## 2013-01-04 NOTE — Progress Notes (Signed)
Doing well.  Good fetal movement, denies vaginal bleeding, LOF, cramping/contractions.  Pt continues to report sensation that air is flowing through her ear.  She was seen in MAU and [place] for this in last 2 weeks.  Physical Examination: Ears - bilateral TM's and external ear canals normal.  Recommend primary care if symptom persists, list given of primary care accepting Medicaid.  Unable to hear FHT by doppler at visit today, U/S in office with normal FHR.

## 2013-01-04 NOTE — Progress Notes (Signed)
Informal [place] for FHT= 135 per PW doppler.  Sharen Counter CNM notified.

## 2013-01-04 NOTE — Progress Notes (Signed)
Pulse- 87 

## 2013-01-09 NOTE — Telephone Encounter (Signed)
Called patient stating I was returning her phone call and we had been unable to get in touch with her before. Patient stated she had been having issues with her ear but its getting better. Patient had no further questions

## 2013-01-31 ENCOUNTER — Encounter: Payer: Self-pay | Admitting: Obstetrics and Gynecology

## 2013-01-31 ENCOUNTER — Ambulatory Visit (INDEPENDENT_AMBULATORY_CARE_PROVIDER_SITE_OTHER): Payer: Medicaid Other | Admitting: Obstetrics and Gynecology

## 2013-01-31 VITALS — BP 147/75 | Temp 97.4°F | Wt 225.0 lb

## 2013-01-31 DIAGNOSIS — F3130 Bipolar disorder, current episode depressed, mild or moderate severity, unspecified: Secondary | ICD-10-CM

## 2013-01-31 DIAGNOSIS — O99340 Other mental disorders complicating pregnancy, unspecified trimester: Secondary | ICD-10-CM

## 2013-01-31 DIAGNOSIS — R4189 Other symptoms and signs involving cognitive functions and awareness: Secondary | ICD-10-CM

## 2013-01-31 DIAGNOSIS — F09 Unspecified mental disorder due to known physiological condition: Secondary | ICD-10-CM

## 2013-01-31 DIAGNOSIS — O34219 Maternal care for unspecified type scar from previous cesarean delivery: Secondary | ICD-10-CM

## 2013-01-31 DIAGNOSIS — Z331 Pregnant state, incidental: Secondary | ICD-10-CM

## 2013-01-31 LAB — CBC
HCT: 30.8 % — ABNORMAL LOW (ref 36.0–46.0)
MCV: 79.4 fL (ref 78.0–100.0)
Platelets: 174 10*3/uL (ref 150–400)
RBC: 3.88 MIL/uL (ref 3.87–5.11)
RDW: 15.4 % (ref 11.5–15.5)
WBC: 10 10*3/uL (ref 4.0–10.5)

## 2013-01-31 LAB — POCT URINALYSIS DIP (DEVICE)
Bilirubin Urine: NEGATIVE
Glucose, UA: 100 mg/dL — AB
Hgb urine dipstick: NEGATIVE
Leukocytes, UA: NEGATIVE
Nitrite: NEGATIVE
pH: 6 (ref 5.0–8.0)

## 2013-01-31 NOTE — Progress Notes (Signed)
Patient doing well without complaints. FM/PTL precautions reviewed. 1hr GCT and labs today. Patient interested in Nexplanon. Although she states this will be her last child, she is not interested in having BTL at time of repeat c-section

## 2013-01-31 NOTE — Progress Notes (Signed)
Pulse 112 2nd BP: 138/75

## 2013-02-01 ENCOUNTER — Encounter: Payer: Self-pay | Admitting: Obstetrics and Gynecology

## 2013-02-01 LAB — GLUCOSE TOLERANCE, 1 HOUR (50G) W/O FASTING: Glucose, 1 Hour GTT: 138 mg/dL (ref 70–140)

## 2013-02-08 ENCOUNTER — Telehealth: Payer: Self-pay

## 2013-02-08 NOTE — Telephone Encounter (Signed)
Called pt and informed pt that she had an abnormal 1 hour glucose and the need to come in for 3hr.  Pt stated that she would be able to come 02/09/13 @ 0800 for testing.  I advised pt to not eat or drink starting midnight tonight and that she would need to be here at the facility for duration of test.  Pt stated understanding.

## 2013-02-08 NOTE — Telephone Encounter (Signed)
Message copied by Faythe Casa on Wed Feb 08, 2013 10:19 AM ------      Message from: CONSTANT, Gigi Gin      Created: Wed Feb 01, 2013  4:47 PM      Regarding: needs 3hr GTT       Please contact patient to have 3 hour glucose challenge test ideally prior to next appointment            Thanks            Peggy ------

## 2013-02-09 ENCOUNTER — Other Ambulatory Visit: Payer: Medicaid Other

## 2013-02-09 DIAGNOSIS — O99810 Abnormal glucose complicating pregnancy: Secondary | ICD-10-CM

## 2013-02-10 LAB — GLUCOSE TOLERANCE, 3 HOURS
Glucose Tolerance, 1 hour: 152 mg/dL (ref 70–189)
Glucose Tolerance, 2 hour: 151 mg/dL (ref 70–164)
Glucose, GTT - 3 Hour: 115 mg/dL (ref 70–144)

## 2013-02-17 ENCOUNTER — Inpatient Hospital Stay (HOSPITAL_COMMUNITY)
Admission: AD | Admit: 2013-02-17 | Discharge: 2013-02-20 | DRG: 781 | Disposition: A | Discharge status: Home / Self Care | Payer: Medicaid Other | Source: Ambulatory Visit | Attending: Obstetrics & Gynecology | Admitting: Obstetrics & Gynecology

## 2013-02-17 ENCOUNTER — Encounter (HOSPITAL_COMMUNITY): Payer: Self-pay | Admitting: *Deleted

## 2013-02-17 ENCOUNTER — Inpatient Hospital Stay (HOSPITAL_COMMUNITY): Payer: Medicaid Other

## 2013-02-17 DIAGNOSIS — F3130 Bipolar disorder, current episode depressed, mild or moderate severity, unspecified: Secondary | ICD-10-CM | POA: Diagnosis present

## 2013-02-17 DIAGNOSIS — O4590 Premature separation of placenta, unspecified, unspecified trimester: Principal | ICD-10-CM | POA: Diagnosis present

## 2013-02-17 DIAGNOSIS — F411 Generalized anxiety disorder: Secondary | ICD-10-CM | POA: Diagnosis present

## 2013-02-17 DIAGNOSIS — O4692 Antepartum hemorrhage, unspecified, second trimester: Secondary | ICD-10-CM

## 2013-02-17 DIAGNOSIS — O4693 Antepartum hemorrhage, unspecified, third trimester: Secondary | ICD-10-CM | POA: Diagnosis present

## 2013-02-17 DIAGNOSIS — R41841 Cognitive communication deficit: Secondary | ICD-10-CM | POA: Diagnosis present

## 2013-02-17 DIAGNOSIS — O99340 Other mental disorders complicating pregnancy, unspecified trimester: Secondary | ICD-10-CM | POA: Diagnosis present

## 2013-02-17 DIAGNOSIS — F319 Bipolar disorder, unspecified: Secondary | ICD-10-CM | POA: Diagnosis present

## 2013-02-17 DIAGNOSIS — O34219 Maternal care for unspecified type scar from previous cesarean delivery: Secondary | ICD-10-CM | POA: Diagnosis present

## 2013-02-17 DIAGNOSIS — R4189 Other symptoms and signs involving cognitive functions and awareness: Secondary | ICD-10-CM | POA: Diagnosis present

## 2013-02-17 HISTORY — DX: Bipolar disorder, unspecified: F31.9

## 2013-02-17 HISTORY — DX: Other symptoms and signs involving cognitive functions and awareness: R41.89

## 2013-02-17 LAB — TYPE AND SCREEN: Antibody Screen: NEGATIVE

## 2013-02-17 LAB — RPR: RPR Ser Ql: NONREACTIVE

## 2013-02-17 LAB — URINALYSIS, ROUTINE W REFLEX MICROSCOPIC
Bilirubin Urine: NEGATIVE
Nitrite: NEGATIVE
Protein, ur: NEGATIVE mg/dL
Specific Gravity, Urine: 1.015 (ref 1.005–1.030)
Urobilinogen, UA: 0.2 mg/dL (ref 0.0–1.0)

## 2013-02-17 LAB — WET PREP, GENITAL
Clue Cells Wet Prep HPF POC: NONE SEEN
Trich, Wet Prep: NONE SEEN
Yeast Wet Prep HPF POC: NONE SEEN

## 2013-02-17 LAB — CBC
HCT: 31.8 % — ABNORMAL LOW (ref 36.0–46.0)
MCHC: 34.6 g/dL (ref 30.0–36.0)
Platelets: 175 10*3/uL (ref 150–400)
RDW: 14.7 % (ref 11.5–15.5)
WBC: 10.2 10*3/uL (ref 4.0–10.5)

## 2013-02-17 MED ORDER — SERTRALINE HCL 25 MG PO TABS
25.0000 mg | ORAL_TABLET | Freq: Every day | ORAL | Status: DC
  Administered 2013-02-17 – 2013-02-20 (×4): 25 mg via ORAL
  Filled 2013-02-17 (×5): qty 1

## 2013-02-17 MED ORDER — DOCUSATE SODIUM 100 MG PO CAPS
100.0000 mg | ORAL_CAPSULE | Freq: Every day | ORAL | Status: DC
  Administered 2013-02-18 – 2013-02-20 (×3): 100 mg via ORAL
  Filled 2013-02-17 (×4): qty 1

## 2013-02-17 MED ORDER — ZOLPIDEM TARTRATE 5 MG PO TABS: 5.0000 mg | ORAL_TABLET | Freq: Every evening | ORAL | Status: DC | PRN

## 2013-02-17 MED ORDER — CALCIUM CARBONATE ANTACID 500 MG PO CHEW: 2.0000 | CHEWABLE_TABLET | ORAL | Status: DC | PRN

## 2013-02-17 MED ORDER — PRENATAL MULTIVITAMIN CH
1.0000 | ORAL_TABLET | Freq: Every day | ORAL | Status: DC
  Administered 2013-02-17 – 2013-02-20 (×4): 1 via ORAL
  Filled 2013-02-17 (×4): qty 1

## 2013-02-17 MED ORDER — BETAMETHASONE SOD PHOS & ACET 6 (3-3) MG/ML IJ SUSP
12.0000 mg | INTRAMUSCULAR | Status: AC
  Administered 2013-02-17 – 2013-02-18 (×2): 12 mg via INTRAMUSCULAR
  Filled 2013-02-17 (×2): qty 2

## 2013-02-17 MED ORDER — ACETAMINOPHEN 325 MG PO TABS: 650.0000 mg | ORAL_TABLET | ORAL | Status: DC | PRN

## 2013-02-17 NOTE — H&P (Signed)
[name] is a 22 y.o. female G3P2002 at [redacted]w[redacted]d pt of [facility] who presents to maternity admissions reporting vaginal bleeding described as light red when wiping at 6 am this morning.  Last intercourse 2-3 days ago per pt, no vaginal exams, nothing per vagina in 48 hours.  She reports good fetal movement, denies abdominal cramping/contractions, LOF, vaginal itching/burning, urinary symptoms, h/a, dizziness, n/v, or fever/chills.    Maternal Medical History:  Reason for admission: Vaginal bleeding.  Nausea.  Contractions: Frequency: rare.    Fetal activity: Perceived fetal activity is normal.   Last perceived fetal movement was within the past hour.    Prenatal Complications - Diabetes: none.    OB History   Grav Para Term Preterm Abortions TAB SAB Ect Mult Living   3 2 2  0 0 0 0 0 0 2     Past Medical History  Diagnosis Date  . Anxiety   . Mental disorder   . Chronic tonsillitis   . Depression   . Cognitive deficits   . Bipolar 1 disorder    Past Surgical History  Procedure Laterality Date  . Cesarean section    . Tonsillectomy N/A 06/03/2012    Procedure: TONSILLECTOMY;  Surgeon: Osborn Coho, MD;  Location: [facility];  Service: ENT;  Laterality: N/A;  . Mass excision N/A 06/03/2012    Procedure: EXCISION MASS;  Surgeon: Osborn Coho, MD;  Location: [facility];  Service: ENT;  Laterality: N/A;  Excision uvula mass   Family History: family history is not on file. Social History:  reports that she quit smoking about 16 months ago. She has never used smokeless tobacco. She reports that she does not drink alcohol or use illicit drugs.   Prenatal Transfer Tool  Maternal Diabetes: No Genetic Screening: Declined Maternal Ultrasounds/Referrals: Normal Fetal Ultrasounds or other Referrals:  None Maternal Substance Abuse:  No Significant Maternal Medications:  None Significant Maternal Lab Results:  None Other Comments:  None  Review of  Systems  Constitutional: Negative for fever, chills and malaise/fatigue.  Eyes: Negative for blurred vision.  Respiratory: Negative for cough and shortness of breath.   Cardiovascular: Negative for chest pain.  Gastrointestinal: Negative for heartburn, nausea, vomiting and abdominal pain.  Genitourinary: Negative for dysuria, urgency and frequency.  Musculoskeletal: Negative.   Neurological: Negative for dizziness and headaches.  Psychiatric/Behavioral: Negative for depression.    Dilation: 1 Effacement (%):  ("shortened") Exam by:: LCraige Cotta, CNM Blood pressure 132/67, pulse 86, temperature 98.1 F (36.7 C), temperature source Oral, resp. rate 18, last menstrual period 07/31/2012. Maternal Exam:  Uterine Assessment: Contraction frequency is rare.   Abdomen: Patient reports no abdominal tenderness. Surgical scars: low transverse.   Cervix: Cervix evaluated by digital exam.     Fetal Exam Fetal Monitor Review: Mode: ultrasound.   Baseline rate: 145.  Variability: moderate (6-25 bpm).   Pattern: accelerations present and no decelerations.    Fetal State Assessment: Category I - tracings are normal.     Physical Exam  Nursing note and vitals reviewed. Constitutional: She is oriented to person, place, and time. She appears well-developed and well-nourished.  Neck: Normal range of motion.  Cardiovascular: Normal rate, regular rhythm and normal heart sounds.   Respiratory: Effort normal and breath sounds normal.  GI: Soft.  Genitourinary:  Pelvic exam: Cervix pink, visually closed, nonfriable and without lesion, small amount dark red blood with quarter sized clot removed with swab, vaginal walls and external genitalia normal  Cervix 1/60%/ballotable  Musculoskeletal: Normal range of motion.  Neurological: She is alert and oriented to person, place, and time. She has normal reflexes.  Skin: Skin is warm and dry.  Psychiatric: She has a normal mood and affect. Her behavior is  normal. Judgment and thought content normal.     Prenatal labs: ABO, Rh: A/POS/-- (06/18 1703) Antibody: NEG (06/18 1703) Rubella: 3.70 (06/18 1703) RPR: NON REAC (10/07 1157)  HBsAg: NEGATIVE (06/18 1703)  HIV: NON REACTIVE (06/18 1703)  GBS:   unknown  Assessment/Plan: 1. Vaginal bleeding in pregnancy, second trimester     Admit to antepartum for observation Betamethasone Q24 h x2 doses Continuous EFM/toco Regular diet Saline lock   LEFTWICH-KIRBY, LISA 02/17/2013, 12:13 PM

## 2013-02-17 NOTE — MAU Note (Signed)
Noted some blood on tissue when she wiped after urination this AM. Small smear on pad afterwards. No recent exam. Had intercourse 2 days ago.

## 2013-02-17 NOTE — MAU Provider Note (Signed)
Chief Complaint:  Vaginal Bleeding   First Provider Initiated Contact with Patient 02/17/13 [phone]      HPI: [name] is a 22 y.o. G3P2002 at [redacted]w[redacted]d who presents to maternity admissions reporting vaginal bleeding described as light red when wiping at 6 am this morning.  Last intercourse 2-3 days ago per pt, no vaginal exams, nothing per vagina in 48 hours.  She reports good fetal movement, denies abdominal cramping/contractions, LOF, vaginal itching/burning, urinary symptoms, h/a, dizziness, n/v, or fever/chills.     Past Medical History: Past Medical History  Diagnosis Date  . Anxiety   . Mental disorder   . Chronic tonsillitis   . Depression   . Cognitive deficits   . Bipolar 1 disorder     Past obstetric history: OB History  Gravida Para Term Preterm AB SAB TAB Ectopic Multiple Living  3 2 2  0 0 0 0 0 0 2    # Outcome Date GA Lbr Len/2nd Weight Sex Delivery Anes PTL Lv  3 CUR           2 TRM 11/16/08 [redacted]w[redacted]d  3.175 kg (7 lb) F LTCS Spinal  Y  1 TRM 11/13/06 [redacted]w[redacted]d  3.714 kg (8 lb 3 oz) F LTCS EPI  Y      Past Surgical History: Past Surgical History  Procedure Laterality Date  . Cesarean section    . Tonsillectomy N/A 06/03/2012    Procedure: TONSILLECTOMY;  Surgeon: Osborn Coho, MD;  Location: [facility];  Service: ENT;  Laterality: N/A;  . Mass excision N/A 06/03/2012    Procedure: EXCISION MASS;  Surgeon: Osborn Coho, MD;  Location:  SURGERY CENTER;  Service: ENT;  Laterality: N/A;  Excision uvula mass    Family History: History reviewed. No pertinent family history.  Social History: History  Substance Use Topics  . Smoking status: Former Smoker -- 7 years    Quit date: 09/30/2011  . Smokeless tobacco: Never Used  . Alcohol Use: No    Allergies:  Allergies  Allergen Reactions  . Penicillins Hives    Face breaks out.    Meds:  Prescriptions prior to admission  Medication Sig Dispense Refill  . busPIRone (BUSPAR) 10 MG  tablet Take 10 mg by mouth daily.      . sertraline (ZOLOFT) 25 MG tablet Take 25 mg by mouth daily.        ROS: Pertinent findings in history of present illness.  Physical Exam  Blood pressure 137/75, pulse 96, temperature 98.1 F (36.7 C), temperature source Oral, resp. rate 18, last menstrual period 07/31/2012. GENERAL: Well-developed, well-nourished female in no acute distress.  HEENT: normocephalic HEART: normal rate RESP: normal effort ABDOMEN: Soft, non-tender, gravid appropriate for gestational age EXTREMITIES: Nontender, no edema NEURO: alert and oriented Pelvic exam: Cervix pink, visually closed, without lesion, small amount dark red bleeding with small clot x1, vaginal walls and external genitalia normal Cervix 1/50/ballotable, firm, posterior   Dilation: 1 Effacement (%):  ("shortened") Exam by:: Clayton Lefort, CNM  FHT:  Baseline 145 , moderate variability, accelerations present (15x15 and 10x10) no decelerations Contractions: None on toco or to palpation  Labs: Results for orders placed during the hospital encounter of 02/17/13 (from the past 24 hour(s))  URINALYSIS, ROUTINE W REFLEX MICROSCOPIC     Status: Abnormal   Collection Time    02/17/13  7:30 AM      Result Value Range   Color, Urine YELLOW  YELLOW   APPearance HAZY (*)  CLEAR   Specific Gravity, Urine 1.015  1.005 - 1.030   pH 7.0  5.0 - 8.0   Glucose, UA NEGATIVE  NEGATIVE mg/dL   Hgb urine dipstick LARGE (*) NEGATIVE   Bilirubin Urine NEGATIVE  NEGATIVE   Ketones, ur NEGATIVE  NEGATIVE mg/dL   Protein, ur NEGATIVE  NEGATIVE mg/dL   Urobilinogen, UA 0.2  0.0 - 1.0 mg/dL   Nitrite NEGATIVE  NEGATIVE   Leukocytes, UA NEGATIVE  NEGATIVE  URINE MICROSCOPIC-ADD ON     Status: Abnormal   Collection Time    02/17/13  7:30 AM      Result Value Range   Squamous Epithelial / LPF FEW (*) RARE   WBC, UA 0-2  <3 WBC/hpf   RBC / HPF 3-6  <3 RBC/hpf   Bacteria, UA FEW (*) RARE    Imaging:     Assessment: 1. Vaginal bleeding in pregnancy, second trimester   2. Third trimester bleeding, antepartum     Plan: Consult Dr Debroah Loop Admit to antepartum for observation Continuous EFM and toco Regular diet Betamethasone x2 doses in 24 hours Saline lock  Sharen Counter Certified Nurse-Midwife 02/17/2013 8:30 AM

## 2013-02-18 LAB — GC/CHLAMYDIA PROBE AMP
CT Probe RNA: NEGATIVE
GC Probe RNA: NEGATIVE

## 2013-02-18 NOTE — Progress Notes (Signed)
Patient ID: [name], female   DOB: 07/26/1990, 22 y.o.   MRN: [id number] FACULTY PRACTICE ANTEPARTUM(COMPREHENSIVE) NOTE  [name] is a 22 y.o. G3P2002 at 109w6d by LMP who is admitted for vaginal bleeding.   Fetal presentation is transverse head left. Length of Stay:  1  Days  Subjective: No bleeding seen since yesterday afternoon Patient reports the fetal movement as active. Patient reports uterine contraction  activity as none. Patient reports  vaginal bleeding as none. Patient describes fluid per vagina as None.  Vitals:  Blood pressure 125/67, pulse 95, temperature 98.5 F (36.9 C), temperature source Oral, resp. rate 18, height 5\' 5"  (1.651 m), weight 228 lb (103.42 kg), last menstrual period 07/31/2012. Physical Examination:  General appearance - alert, well appearing, and in no distress Heart - normal rate and regular rhythm Abdomen - soft, nontender, nondistended Fundal Height:  size equals dates Cervical Exam: Not evaluated. Extremities: extremities normal, atraumatic, no cyanosis or edema Membranes:intact  Fetal Monitoring:  Baseline: 140 bpm  Labs:  Results for orders placed during the hospital encounter of 02/17/13 (from the past 24 hour(s))  URINALYSIS, ROUTINE W REFLEX MICROSCOPIC   Collection Time    02/17/13  7:30 AM      Result Value Range   Color, Urine YELLOW  YELLOW   APPearance HAZY (*) CLEAR   Specific Gravity, Urine 1.015  1.005 - 1.030   pH 7.0  5.0 - 8.0   Glucose, UA NEGATIVE  NEGATIVE mg/dL   Hgb urine dipstick LARGE (*) NEGATIVE   Bilirubin Urine NEGATIVE  NEGATIVE   Ketones, ur NEGATIVE  NEGATIVE mg/dL   Protein, ur NEGATIVE  NEGATIVE mg/dL   Urobilinogen, UA 0.2  0.0 - 1.0 mg/dL   Nitrite NEGATIVE  NEGATIVE   Leukocytes, UA NEGATIVE  NEGATIVE  URINE MICROSCOPIC-ADD ON   Collection Time    02/17/13  7:30 AM      Result Value Range   Squamous Epithelial / LPF FEW (*) RARE   WBC, UA 0-2  <3 WBC/hpf   RBC / HPF 3-6  <3 RBC/hpf    Bacteria, UA FEW (*) RARE  WET PREP, GENITAL   Collection Time    02/17/13  8:23 AM      Result Value Range   Yeast Wet Prep HPF POC NONE SEEN  NONE SEEN   Trich, Wet Prep NONE SEEN  NONE SEEN   Clue Cells Wet Prep HPF POC NONE SEEN  NONE SEEN   WBC, Wet Prep HPF POC FEW (*) NONE SEEN  CBC   Collection Time    02/17/13  1:18 PM      Result Value Range   WBC 10.2  4.0 - 10.5 K/uL   RBC 4.08  3.87 - 5.11 MIL/uL   Hemoglobin 11.0 (*) 12.0 - 15.0 g/dL   HCT 40.9 (*) 81.1 - 91.4 %   MCV 77.9 (*) 78.0 - 100.0 fL   MCH 27.0  26.0 - 34.0 pg   MCHC 34.6  30.0 - 36.0 g/dL   RDW 78.2  95.6 - 21.3 %   Platelets 175  150 - 400 K/uL  RPR   Collection Time    02/17/13  1:18 PM      Result Value Range   RPR NON REACTIVE  NON REACTIVE  TYPE AND SCREEN   Collection Time    02/17/13  1:18 PM      Result Value Range   ABO/RH(D) A POS  Antibody Screen NEG     Sample Expiration 02/20/2013      Imaging Studies:      Medications:  Scheduled . betamethasone acetate-betamethasone sodium phosphate  12 mg Intramuscular Q24H  . docusate sodium  100 mg Oral Daily  . prenatal multivitamin  1 tablet Oral Q1200  . sertraline  25 mg Oral Daily   I have reviewed the patient's current medications.  ASSESSMENT: Patient Active Problem List   Diagnosis Date Noted  . Previous cesarean section complicating pregnancy 01/31/2013  . Mental disorders of mother, antepartum(648.43) 10/24/2012  . Pregnancy test positive for incidental pregnancy 10/12/2012  . Cognitive deficits 10/12/2012  . Bipolar I disorder, most recent episode (or current) depressed, unspecified 07/02/2012    Class: Chronic  . Generalized anxiety disorder 06/28/2012   Third trimester bleeding PLAN: Observe for bleeding, 2nd dose of betamethasone  [name],[name] 02/18/2013,7:13 AM

## 2013-02-18 NOTE — H&P (Signed)
Agree with admission and note by CNM.  Adam Phenix, MD 02/18/2013

## 2013-02-19 DIAGNOSIS — O34219 Maternal care for unspecified type scar from previous cesarean delivery: Secondary | ICD-10-CM

## 2013-02-19 DIAGNOSIS — O99340 Other mental disorders complicating pregnancy, unspecified trimester: Secondary | ICD-10-CM

## 2013-02-19 DIAGNOSIS — O4590 Premature separation of placenta, unspecified, unspecified trimester: Secondary | ICD-10-CM

## 2013-02-19 DIAGNOSIS — O4693 Antepartum hemorrhage, unspecified, third trimester: Secondary | ICD-10-CM | POA: Diagnosis present

## 2013-02-19 DIAGNOSIS — F319 Bipolar disorder, unspecified: Secondary | ICD-10-CM

## 2013-02-19 DIAGNOSIS — O4690 Antepartum hemorrhage, unspecified, unspecified trimester: Secondary | ICD-10-CM

## 2013-02-19 NOTE — Progress Notes (Signed)
Saline lock flushed.

## 2013-02-19 NOTE — Progress Notes (Signed)
Patient ID: [name], female   DOB: 1990/12/17, 22 y.o.   MRN: [id number] FACULTY PRACTICE ANTEPARTUM(COMPREHENSIVE) NOTE  [name] is a 22 y.o. G3P2002 at [redacted]w[redacted]d by LMP who is admitted for vaginal bleeding.   Fetal presentation is transverse head left. Length of Stay:  2  Days  Subjective: No bleeding seen since yesterday evening, not bright red, just small amount of dark brown blood. Patient reports the fetal movement as active. Patient reports uterine contraction  activity as none. Patient reports vaginal bleeding as none. Patient describes fluid per vagina as None.  Vitals:  Blood pressure 131/64, pulse 89, temperature 98.3 F (36.8 C), temperature source Oral, resp. rate 18, height 5\' 5"  (1.651 m), weight 228 lb (103.42 kg), last menstrual period 07/31/2012. Physical Examination: General appearance - alert, well appearing, and in no distress Heart - normal rate and regular rhythm Abdomen - soft, nontender, nondistended Fundal Height:  size equals dates Cervical Exam: Not evaluated. Extremities: extremities normal, atraumatic, no cyanosis or edema Membranes:intact  Fetal Monitoring:  Baseline: 140 bpm, moderate variability, +accelerations, occasional mild variable decelerations   Labs:  No results found for this or any previous visit (from the past 24 hour(s)).  Imaging Studies:    02/17/13 [redacted]w[redacted]d TVUS cervical length 4.7 cm, posterior placenta without previa, AFI 16.07 cm, fetus presentation is transverse with head to maternal left  Medications:  Scheduled . docusate sodium  100 mg Oral Daily  . prenatal multivitamin  1 tablet Oral Q1200  . sertraline  25 mg Oral Daily   I have reviewed the patient's current medications.  ASSESSMENT: Patient Active Problem List   Diagnosis Date Noted  . Third trimester bleeding, antepartum 02/19/2013  . Previous cesarean section complicating pregnancy 01/31/2013  . Mental disorders of mother, antepartum(648.43) 10/24/2012   . Cognitive deficits 10/12/2012  . Bipolar I disorder, most recent episode (or current) depressed, unspecified 07/02/2012    Class: Chronic  . Generalized anxiety disorder 06/28/2012    PLAN: She has received two doses of betamethasone; 48 hours will be today (02/19/13) at 1330 Still noted some bleeding yesterday, continue observation for a minimum of 48 hours without any bleeding.  Continue observation, NST every shift, tocometer as needed Growth scan ordered for tomorrow Routine antenatal care  Tereso Newcomer, MD 02/19/2013,6:48 AM

## 2013-02-20 ENCOUNTER — Observation Stay (HOSPITAL_COMMUNITY): Payer: Medicaid Other

## 2013-02-20 LAB — RAPID URINE DRUG SCREEN, HOSP PERFORMED
Benzodiazepines: NOT DETECTED
Cocaine: NOT DETECTED
Opiates: NOT DETECTED
Tetrahydrocannabinol: NOT DETECTED

## 2013-02-20 MED ORDER — PRENATAL MULTIVITAMIN CH: 1.0000 | ORAL_TABLET | Freq: Every day | ORAL | Status: DC

## 2013-02-20 NOTE — Progress Notes (Signed)
Ur chart review completed.  

## 2013-02-20 NOTE — Progress Notes (Signed)
FACULTY PRACTICE ANTEPARTUM(COMPREHENSIVE) NOTE  [name] is a 22 y.o. G3P2002 at [redacted]w[redacted]d by LMP who is admitted for vaginal bleeding per patient report.  No bleeding observedin ED or during hosptial stay.  Last intercourse was 2 days prior to spotting.  No evidence of trauma or abruption on [place].   Fetal presentation is breech. Length of Stay:  3  Days  Subjective: No bleeding since Saturday >48hrs Patient reports the fetal movement as active. Patient reports uterine contraction  activity as none. Patient reports  vaginal bleeding as resolveed. Patient describes fluid per vagina as None.  Vitals:  Blood pressure 133/70, pulse 95, temperature 98.7 F (37.1 C), temperature source Oral, resp. rate 18, height 5\' 5"  (1.651 m), weight 228 lb (103.42 kg), last menstrual period 07/31/2012. Physical Examination:  General appearance - alert, well appearing, and in no distress Heart - normal rate and regular rhythm Abdomen - soft, nontender, nondistended Fundal Height:  size equals dates Cervical Exam: Not evaluated.  Extremities: extremities normal, atraumatic, no cyanosis or edema and Homans sign is negative, no sign of DVT Membranes:intact  Fetal Monitoring:  Baseline: 160s bpm, Variability: Good {> 6 bpm), Accelerations: Reactive and Decelerations: Absent Reassuring and reactive. Accels 15x15s  Labs:  Results for orders placed during the hospital encounter of 02/17/13 (from the past 24 hour(s))  URINE RAPID DRUG SCREEN (HOSP PERFORMED)   Collection Time    02/20/13 11:30 AM      Result Value Range   Opiates NONE DETECTED  NONE DETECTED   Cocaine NONE DETECTED  NONE DETECTED   Benzodiazepines NONE DETECTED  NONE DETECTED   Amphetamines NONE DETECTED  NONE DETECTED   Tetrahydrocannabinol NONE DETECTED  NONE DETECTED   Barbiturates NONE DETECTED  NONE DETECTED     Medications:  Scheduled . docusate sodium  100 mg Oral Daily  . prenatal multivitamin  1 tablet Oral Q1200  .  sertraline  25 mg Oral Daily   I have reviewed the patient's current medications.  ASSESSMENT: [name] is a 22 y.o. G3P2002 at [redacted]w[redacted]d by LMP who is admitted for vaginal bleeding per patient report.  No bleeding noted during hospital stay.  No evidence of trauma onexam US shows normal placenta.    Patient Active Problem List   Diagnosis Date Noted  . Third trimester bleeding, antepartum 02/19/2013  . Previous cesarean section complicating pregnancy 01/31/2013  . Mental disorders of mother, antepartum(648.43) 10/24/2012  . Cognitive deficits 10/12/2012  . Bipolar I disorder, most recent episode (or current) depressed, unspecified 07/02/2012    Class: Chronic  . Generalized anxiety disorder 06/28/2012    PLAN: Pt rec'd steroids, no ctx, no further bleeding. Pt stable for discharge with close interval follow up. Return to hospital for further vaginal bleeding.  ODOM, MICHAEL RYAN 02/20/2013,2:02 PM  Attestation of Attending Supervision of Fellow: Evaluation and management procedures were performed by the Fellow under my supervision and collaboration. I have reviewed the Fellow's note and chart, and I agree with the management and plan.

## 2013-02-20 NOTE — Discharge Summary (Cosign Needed)
Physician Discharge Summary  Patient ID: [name] MRN: [id number] DOB/AGE: [birth year] 24 y.o.  Admit date: 02/17/2013 Discharge date: 02/20/2013  Admission Diagnoses: Vaginal bleeding  Third trimester  Discharge Diagnoses:  Principal Problem:   Third trimester bleeding, antepartum Active Problems:   Generalized anxiety disorder   Bipolar I disorder, most recent episode (or current) depressed, unspecified   Cognitive deficits   Mental disorders of mother, antepartum(648.43)   Previous cesarean section complicating pregnancy   Discharged Condition: good  Hospital Course: Pt was admitted on 10/24 for vaginal bleeding attributed to abruption. Pt last vaginal bleeding was on 10/25 (>48hr prior to dishcarge). Pt has not had any visualized bleeding on any exam by providers. Pt has otherwise been doing well without issue. Pt has been hemodynamically stable, fetus has had reassuring NST and no contractions on toco. US showed no evidence of abruption commented on in review.  Consults: None  Significant Diagnostic Studies: labs: Negative UDS and radiology: Ultrasound: reassuring with no comment of abruption and found to have known abdominal masses with plan to f/u PP.  Treatments: steroids: Betamethasone 12mg  x2 on 10/24 and 10/25  Discharge Exam: Blood pressure 133/70, pulse 95, temperature 98.7 F (37.1 C), temperature source Oral, resp. rate 18, height 5\' 5"  (1.651 m), weight 228 lb (103.42 kg), last menstrual period 07/31/2012. General appearance: alert, cooperative, appears stated age and no distress Resp: clear to auscultation bilaterally and normal percussion bilaterally Cardio: regular rate and rhythm, S1, S2 normal, no murmur, click, rub or gallop GI: Soft NTTP, ND, Gravid size=dates.  Disposition: 01-Home or Self Care       Future Appointments Provider Department Dept Phone   02/28/2013 11:00 AM Catalina Antigua, MD [name] [phone]        Medication List         busPIRone 10 MG tablet  Commonly known as:  BUSPAR  Take 10 mg by mouth daily.     prenatal multivitamin Tabs tablet  Take 1 tablet by mouth daily at 12 noon.     sertraline 25 MG tablet  Commonly known as:  ZOLOFT  Take 25 mg by mouth daily.       Follow-up Information   Follow up with [facility]. (Dr. Jolayne Panther as scheduled on 02/28/2013 @ 11am)    Specialty:  Obstetrics and Gynecology   Contact information:   [address] [place] [address] [phone]      Signed: Tawana Scale 02/20/2013, 2:24 PM

## 2013-02-21 ENCOUNTER — Telehealth: Payer: Self-pay | Admitting: *Deleted

## 2013-02-21 NOTE — Telephone Encounter (Addendum)
Message copied by Jill Side on Tue Feb 21, 2013 12:40 PM ------      Message from: Adam Phenix      Created: Tue Feb 21, 2013 10:33 AM       Normal GTT  ------ Called pt and informed her of normal 3hr GTT. She voiced understanding.

## 2013-02-28 ENCOUNTER — Encounter: Payer: Self-pay | Admitting: Obstetrics and Gynecology

## 2013-02-28 ENCOUNTER — Ambulatory Visit (INDEPENDENT_AMBULATORY_CARE_PROVIDER_SITE_OTHER): Payer: Medicaid Other | Admitting: Obstetrics and Gynecology

## 2013-02-28 VITALS — BP 131/78 | Temp 98.0°F | Wt 229.3 lb

## 2013-02-28 DIAGNOSIS — F3130 Bipolar disorder, current episode depressed, mild or moderate severity, unspecified: Secondary | ICD-10-CM

## 2013-02-28 DIAGNOSIS — O99340 Other mental disorders complicating pregnancy, unspecified trimester: Secondary | ICD-10-CM

## 2013-02-28 DIAGNOSIS — O34219 Maternal care for unspecified type scar from previous cesarean delivery: Secondary | ICD-10-CM

## 2013-02-28 NOTE — Progress Notes (Signed)
Patient doing well without any complaints. FM/PTL precautions reviewed

## 2013-02-28 NOTE — Progress Notes (Signed)
P-110 

## 2013-03-02 ENCOUNTER — Other Ambulatory Visit: Payer: Self-pay

## 2013-03-14 ENCOUNTER — Encounter: Payer: Self-pay | Admitting: Family Medicine

## 2013-03-14 ENCOUNTER — Ambulatory Visit (INDEPENDENT_AMBULATORY_CARE_PROVIDER_SITE_OTHER): Payer: Medicaid Other | Admitting: Family Medicine

## 2013-03-14 VITALS — BP 134/77 | Temp 97.6°F | Wt 232.1 lb

## 2013-03-14 DIAGNOSIS — Z3483 Encounter for supervision of other normal pregnancy, third trimester: Secondary | ICD-10-CM

## 2013-03-14 DIAGNOSIS — O34219 Maternal care for unspecified type scar from previous cesarean delivery: Secondary | ICD-10-CM

## 2013-03-14 LAB — POCT URINALYSIS DIP (DEVICE)
Bilirubin Urine: NEGATIVE
Hgb urine dipstick: NEGATIVE
Ketones, ur: NEGATIVE mg/dL
Protein, ur: NEGATIVE mg/dL
Specific Gravity, Urine: 1.025 (ref 1.005–1.030)
Urobilinogen, UA: 0.2 mg/dL (ref 0.0–1.0)
pH: 6 (ref 5.0–8.0)

## 2013-03-14 NOTE — Progress Notes (Signed)
P= 90  Pt c/o of brownish discharge last night/denies any today.

## 2013-03-14 NOTE — Progress Notes (Signed)
S: pt here for return OBV @ [redacted]w[redacted]d - pregnancy complicated by mental health concerns and third trimester bleeding. Now resolved.  No more bleeding No ctx, lof. +FM No discharge  O: see flowsheet  A/P - FM/PTL precautions discussed  - due date changed from 1/11 to 1/6 after review of early [place] and knowing pt with unsure LMP.  - will schedule repeat C/S for 39 weeks. Message to [place] - pt does not desire BTL at time of c/s -f/u in 2-3 weeks

## 2013-03-15 ENCOUNTER — Encounter: Payer: Self-pay | Admitting: *Deleted

## 2013-03-29 ENCOUNTER — Telehealth: Payer: Self-pay

## 2013-03-29 NOTE — Telephone Encounter (Signed)
Pt. Called stating she wanted a call back. Did not leave reason for call.   Called pt. And left message stating we are returning her call if she could call clinic during business hours.

## 2013-04-04 ENCOUNTER — Ambulatory Visit (INDEPENDENT_AMBULATORY_CARE_PROVIDER_SITE_OTHER): Payer: Medicaid Other | Admitting: Family Medicine

## 2013-04-04 ENCOUNTER — Encounter: Payer: Self-pay | Admitting: Family Medicine

## 2013-04-04 VITALS — BP 136/79 | Wt 237.6 lb

## 2013-04-04 DIAGNOSIS — O34219 Maternal care for unspecified type scar from previous cesarean delivery: Secondary | ICD-10-CM

## 2013-04-04 LAB — POCT URINALYSIS DIP (DEVICE)
Bilirubin Urine: NEGATIVE
Hgb urine dipstick: NEGATIVE
Leukocytes, UA: NEGATIVE
Nitrite: NEGATIVE
Protein, ur: 30 mg/dL — AB
pH: 7 (ref 5.0–8.0)

## 2013-04-04 NOTE — Progress Notes (Signed)
Pulse: 106

## 2013-04-04 NOTE — Progress Notes (Signed)
+  FM, no lof, no vb no ctx  [name] is a 22 y.o. G3P2002 at [redacted]w[redacted]d  here for ROB visit.  GBS today, GC/C today  Discussed with Patient:  -Plans to bottle feed.  All questions answered. -Continue prenatal vitamins. -Reviewed fetal kick counts Pt to perform daily at a time when the baby is active, lie laterally with both hands on belly in quiet room and count all movements (hiccups, shoulder rolls, obvious kicks, etc); pt is to report to clinic L&D for less than 10 movements felt in a one hour time period-pt told as soon as she counts 10 movements the count is complete.  - Routine precautions discussed (depression, infection s/s).   Patient provided with all pertinent phone numbers for emergencies. - RTC for any VB, regular, painful cramps/ctxs occurring at a rate of >2/10 min, fever (100.5 or higher), n/v/d, any pain that is unresolving or worsening, LOF, decreased fetal movement, CP, SOB, edema - RTC in 2 weeks for next appt. - Did GBS swabs today and will f/u results and call if abnormal.  Contact#:  Pt has no drug allergies, so will get PCN if GBS+ OR will get sensitivities on GBS swab as pt is PCN-allergic.  Problems: Patient Active Problem List   Diagnosis Date Noted  . Third trimester bleeding, antepartum 02/19/2013  . Previous cesarean section complicating pregnancy 01/31/2013  . Mental disorders of mother, antepartum(648.43) 10/24/2012  . Cognitive deficits 10/12/2012  . Bipolar I disorder, most recent episode (or current) depressed, unspecified 07/02/2012    Class: Chronic  . Generalized anxiety disorder 06/28/2012    To Do: 1.  GBS today  [ ]  Vaccines: Flu: declines Tdap: declines [x ] BCM: mirena [ ]  Readiness: baby has a place to sleep, car seat, other baby necessities.  Edu: [x ] PTL precautions; [ ]  BF class; [ ]  childbirth class; [ ]   BF counseling;

## 2013-04-04 NOTE — Progress Notes (Deleted)
ASCUS +, HPV + needs colpo

## 2013-04-04 NOTE — Patient Instructions (Signed)
Third Trimester of Pregnancy  The third trimester is from week 29 through week 42, months 7 through 9. The third trimester is a time when the fetus is growing rapidly. At the end of the ninth month, the fetus is about 20 inches in length and weighs 6 10 pounds.   BODY CHANGES  Your body goes through many changes during pregnancy. The changes vary from woman to woman.    Your weight will continue to increase. You can expect to gain 25 35 pounds (11 16 kg) by the end of the pregnancy.   You may begin to get stretch marks on your hips, abdomen, and breasts.   You may urinate more often because the fetus is moving lower into your pelvis and pressing on your bladder.   You may develop or continue to have heartburn as a result of your pregnancy.   You may develop constipation because certain hormones are causing the muscles that push waste through your intestines to slow down.   You may develop hemorrhoids or swollen, bulging veins (varicose veins).   You may have pelvic pain because of the weight gain and pregnancy hormones relaxing your joints between the bones in your pelvis. Back aches may result from over exertion of the muscles supporting your posture.   Your breasts will continue to grow and be tender. A yellow discharge may leak from your breasts called colostrum.   Your belly button may stick out.   You may feel short of breath because of your expanding uterus.   You may notice the fetus "dropping," or moving lower in your abdomen.   You may have a bloody mucus discharge. This usually occurs a few days to a week before labor begins.   Your cervix becomes thin and soft (effaced) near your due date.  WHAT TO EXPECT AT YOUR PRENATAL EXAMS   You will have prenatal exams every 2 weeks until week 36. Then, you will have weekly prenatal exams. During a routine prenatal visit:   You will be weighed to make sure you and the fetus are growing normally.   Your blood pressure is taken.   Your abdomen will be  measured to track your baby's growth.   The fetal heartbeat will be listened to.   Any test results from the previous visit will be discussed.   You may have a cervical check near your due date to see if you have effaced.  At around 36 weeks, your caregiver will check your cervix. At the same time, your caregiver will also perform a test on the secretions of the vaginal tissue. This test is to determine if a type of bacteria, Group B streptococcus, is present. Your caregiver will explain this further.  Your caregiver may ask you:   What your birth plan is.   How you are feeling.   If you are feeling the baby move.   If you have had any abnormal symptoms, such as leaking fluid, bleeding, severe headaches, or abdominal cramping.   If you have any questions.  Other tests or screenings that may be performed during your third trimester include:   Blood tests that check for low iron levels (anemia).   Fetal testing to check the health, activity level, and growth of the fetus. Testing is done if you have certain medical conditions or if there are problems during the pregnancy.  FALSE LABOR  You may feel small, irregular contractions that eventually go away. These are called Braxton Hicks contractions, or   false labor. Contractions may last for hours, days, or even weeks before true labor sets in. If contractions come at regular intervals, intensify, or become painful, it is best to be seen by your caregiver.   SIGNS OF LABOR    Menstrual-like cramps.   Contractions that are 5 minutes apart or less.   Contractions that start on the top of the uterus and spread down to the lower abdomen and back.   A sense of increased pelvic pressure or back pain.   A watery or bloody mucus discharge that comes from the vagina.  If you have any of these signs before the 37th week of pregnancy, call your caregiver right away. You need to go to the hospital to get checked immediately.  HOME CARE INSTRUCTIONS    Avoid all  smoking, herbs, alcohol, and unprescribed drugs. These chemicals affect the formation and growth of the baby.   Follow your caregiver's instructions regarding medicine use. There are medicines that are either safe or unsafe to take during pregnancy.   Exercise only as directed by your caregiver. Experiencing uterine cramps is a good sign to stop exercising.   Continue to eat regular, healthy meals.   Wear a good support bra for breast tenderness.   Do not use hot tubs, steam rooms, or saunas.   Wear your seat belt at all times when driving.   Avoid raw meat, uncooked cheese, cat litter boxes, and soil used by cats. These carry germs that can cause birth defects in the baby.   Take your prenatal vitamins.   Try taking a stool softener (if your caregiver approves) if you develop constipation. Eat more high-fiber foods, such as fresh vegetables or fruit and whole grains. Drink plenty of fluids to keep your urine clear or pale yellow.   Take warm sitz baths to soothe any pain or discomfort caused by hemorrhoids. Use hemorrhoid cream if your caregiver approves.   If you develop varicose veins, wear support hose. Elevate your feet for 15 minutes, 3 4 times a day. Limit salt in your diet.   Avoid heavy lifting, wear low heal shoes, and practice good posture.   Rest a lot with your legs elevated if you have leg cramps or low back pain.   Visit your dentist if you have not gone during your pregnancy. Use a soft toothbrush to brush your teeth and be gentle when you floss.   A sexual relationship may be continued unless your caregiver directs you otherwise.   Do not travel far distances unless it is absolutely necessary and only with the approval of your caregiver.   Take prenatal classes to understand, practice, and ask questions about the labor and delivery.   Make a trial run to the hospital.   Pack your hospital bag.   Prepare the baby's nursery.   Continue to go to all your prenatal visits as directed  by your caregiver.  SEEK MEDICAL CARE IF:   You are unsure if you are in labor or if your water has broken.   You have dizziness.   You have mild pelvic cramps, pelvic pressure, or nagging pain in your abdominal area.   You have persistent nausea, vomiting, or diarrhea.   You have a bad smelling vaginal discharge.   You have pain with urination.  SEEK IMMEDIATE MEDICAL CARE IF:    You have a fever.   You are leaking fluid from your vagina.   You have spotting or bleeding from your vagina.     You have severe abdominal cramping or pain.   You have rapid weight loss or gain.   You have shortness of breath with chest pain.   You notice sudden or extreme swelling of your face, hands, ankles, feet, or legs.   You have not felt your baby move in over an hour.   You have severe headaches that do not go away with medicine.   You have vision changes.  Document Released: 04/07/2001 Document Revised: 12/14/2012 Document Reviewed: 06/14/2012  [employer] Patient Information 2014 [employer].

## 2013-04-05 LAB — GC/CHLAMYDIA PROBE AMP: CT Probe RNA: NEGATIVE

## 2013-04-08 LAB — CULTURE, BETA STREP (GROUP B ONLY)

## 2013-04-09 ENCOUNTER — Encounter: Payer: Self-pay | Admitting: Family Medicine

## 2013-04-12 ENCOUNTER — Ambulatory Visit (INDEPENDENT_AMBULATORY_CARE_PROVIDER_SITE_OTHER): Payer: Medicaid Other | Admitting: Advanced Practice Midwife

## 2013-04-12 VITALS — BP 130/79 | Temp 98.0°F | Wt 235.6 lb

## 2013-04-12 DIAGNOSIS — O34219 Maternal care for unspecified type scar from previous cesarean delivery: Secondary | ICD-10-CM

## 2013-04-12 DIAGNOSIS — R109 Unspecified abdominal pain: Secondary | ICD-10-CM

## 2013-04-12 DIAGNOSIS — O99340 Other mental disorders complicating pregnancy, unspecified trimester: Secondary | ICD-10-CM

## 2013-04-12 DIAGNOSIS — G8929 Other chronic pain: Secondary | ICD-10-CM

## 2013-04-12 DIAGNOSIS — F3130 Bipolar disorder, current episode depressed, mild or moderate severity, unspecified: Secondary | ICD-10-CM

## 2013-04-12 DIAGNOSIS — O4690 Antepartum hemorrhage, unspecified, unspecified trimester: Secondary | ICD-10-CM

## 2013-04-12 LAB — POCT URINALYSIS DIP (DEVICE)
Ketones, ur: NEGATIVE mg/dL
Leukocytes, UA: NEGATIVE
Protein, ur: NEGATIVE mg/dL
Urobilinogen, UA: 0.2 mg/dL (ref 0.0–1.0)
pH: 6 (ref 5.0–8.0)

## 2013-04-12 NOTE — Progress Notes (Signed)
P= 93 C/o of pain in bilateral legs around upper thighs.

## 2013-04-12 NOTE — Progress Notes (Signed)
Doing well.  Good fetal movement, denies vaginal bleeding, LOF, regular contractions.  Labor signs reviewed.  C/S scheduled 12/30 at 9:30 am.   Cervix 1/40/-3, posterior, firm.

## 2013-04-13 ENCOUNTER — Encounter (HOSPITAL_COMMUNITY): Payer: Self-pay

## 2013-04-19 ENCOUNTER — Ambulatory Visit (INDEPENDENT_AMBULATORY_CARE_PROVIDER_SITE_OTHER): Payer: Medicaid Other | Admitting: Obstetrics & Gynecology

## 2013-04-19 VITALS — BP 123/76 | Temp 97.7°F | Wt 239.0 lb

## 2013-04-19 DIAGNOSIS — Z3490 Encounter for supervision of normal pregnancy, unspecified, unspecified trimester: Secondary | ICD-10-CM

## 2013-04-19 DIAGNOSIS — O34219 Maternal care for unspecified type scar from previous cesarean delivery: Secondary | ICD-10-CM

## 2013-04-19 LAB — POCT URINALYSIS DIP (DEVICE)
Glucose, UA: NEGATIVE mg/dL
Hgb urine dipstick: NEGATIVE
Nitrite: NEGATIVE
Protein, ur: 100 mg/dL — AB
Specific Gravity, Urine: >=1.03 (ref 1.005–1.030)
Urobilinogen, UA: 0.2 mg/dL (ref 0.0–1.0)

## 2013-04-19 NOTE — Patient Instructions (Signed)
Cesarean Delivery  Cesarean delivery is the birth of a baby through a cut (incision) in the abdomen and womb (uterus).  LET YOUR CAREGIVER KNOW ABOUT:  Complicationsinvolving the pregnancy.  Allergies.  Medicines taken including herbs, eyedrops, over-the-counter medicines, and creams.  Use of steroids (by mouth or creams).  Previous problems with anesthetics or numbing medicine.  Previous surgery.  History of blood clots.  History of bleeding or blood problems.  Other health problems. RISKS AND COMPLICATIONS   Bleeding.  Infection.  Blood clots.  Injury to surrounding organs.  Anesthesia problems.  Injury to the baby. BEFORE THE PROCEDURE   A tube (Foley catheter) will be placed in your bladder. The Foley catheter drains the urine from your bladder into a bag. This keeps your bladder empty during surgery.  An intravenous access tube (IV) will be placed in your arm.  Hair may be removed from your pubic area and your lower abdomen. This is to prevent infection in the incision site.  You may be given an antacid medicine to drink. This will prevent acid contents in your stomach from going into your lungs if you vomit during the surgery.  You may be given an antibiotic medicine to prevent infection. PROCEDURE   You may be given medicine to numb the lower half of your body (regional anesthetic). If you were in labor, you may have already had an epidural in place which can be used in both labor and cesarean delivery. You may possibly be given medicine to make you sleep (general anesthetic) though this is not as common.  An incision will be made in your abdomen that extends to your uterus. There are 2 basic kinds of incisions:  The horizontal (transverse) incision. Horizontal incisions are used for most routine cesarean deliveries.  The vertical (up and down) incision. This is less commonly used. This is most often reserved for women who have a serious complication  (extreme prematurity) or under emergency situations.  The horizontal and vertical incisions may both be used at the same time. However, this is very uncommon.  Your baby will then be delivered. AFTER THE PROCEDURE   If you were awake during the surgery, you will see your baby right away. If you were asleep, you will see your baby as soon as you are awake.  You may breastfeed your baby after surgery.  You may be able to get up and walk the same day as the surgery. If you need to stay in bed for a period of time, you will receive help to turn, cough, and take deep breaths after surgery. This helps prevent lung problems such as pneumonia.  Do not get out of bed alone the first time after surgery. You will need help getting out of bed until you are able to do this by yourself.  You may be able to shower the day after your cesarean delivery. After the bandage (dressing) is taken off the incision site, a nurse will assist you to shower, if you like.  You will have pneumatic compressing hose placed on your feet or lower legs. These hose are used to prevent blood clots. When you are up and walking regularly, they will no longer be necessary.  Do not cross your legs when you sit.  Save any blood clots that you pass. If you pass a clot while on the toilet, do not flush it. Call for the nurse. Tell the nurse if you think you are bleeding too much or passing too many   clots.  Start drinking liquids and eating food as directed by your caregiver. If your stomach is not ready, drinking and eating too soon can cause an increase in bloating and swelling of your intestine and abdomen. This is very uncomfortable.  You will be given medicine as needed. Let your caregivers know if you are hurting. They want you to be comfortable. You may also be given an antibiotic to prevent an infection.  Your IV will be taken out when you are drinking a reasonable amount of fluids. The Foley catheter is taken out when  you are up and walking.  If your blood type is Rh negative and your baby's blood type is Rh positive, you will be given a shot of anti-D immune globulin. This shot prevents you from having Rh problems with a future pregnancy. You should get the shot even if you had your tubes tied (tubal ligation).  If you are allowed to take the baby for a walk, place the baby in the bassinet and push it. Do not carry your baby in your arms. Document Released: 04/13/2005 Document Revised: 07/06/2011 Document Reviewed: 11/02/2012 [employer] Patient Information 2014 [employer].  

## 2013-04-19 NOTE — Progress Notes (Signed)
CS on 12/30.

## 2013-04-19 NOTE — Progress Notes (Signed)
P= 112  Desires cervical exam

## 2013-04-20 ENCOUNTER — Inpatient Hospital Stay (HOSPITAL_COMMUNITY): Payer: Medicaid Other

## 2013-04-20 ENCOUNTER — Encounter (HOSPITAL_COMMUNITY): Payer: Self-pay | Admitting: Family

## 2013-04-20 ENCOUNTER — Inpatient Hospital Stay (HOSPITAL_COMMUNITY)
Admission: AD | Admit: 2013-04-20 | Discharge: 2013-04-20 | Disposition: A | Discharge status: Home / Self Care | Payer: Medicaid Other | Source: Ambulatory Visit | Attending: Obstetrics & Gynecology | Admitting: Obstetrics & Gynecology

## 2013-04-20 DIAGNOSIS — Z87891 Personal history of nicotine dependence: Secondary | ICD-10-CM | POA: Insufficient documentation

## 2013-04-20 DIAGNOSIS — O4690 Antepartum hemorrhage, unspecified, unspecified trimester: Secondary | ICD-10-CM | POA: Insufficient documentation

## 2013-04-20 DIAGNOSIS — O4693 Antepartum hemorrhage, unspecified, third trimester: Secondary | ICD-10-CM

## 2013-04-20 DIAGNOSIS — O479 False labor, unspecified: Secondary | ICD-10-CM | POA: Insufficient documentation

## 2013-04-20 DIAGNOSIS — R109 Unspecified abdominal pain: Secondary | ICD-10-CM | POA: Insufficient documentation

## 2013-04-20 NOTE — MAU Note (Signed)
Arrived via EMS stating she was having some contractions and noted some bleeding this AM when she got up. EMS personnel stated she had only one pain in 10-15 minute transport. They stated they noted small amount dark blood. Patient states pain has now subsided.

## 2013-04-20 NOTE — MAU Provider Note (Signed)
  History     CSN: [id number]  Arrival date and time: 04/20/13 0901   None     Chief Complaint  Patient presents with  . Contractions   HPI  Pt is a 22 yo G3P2002 at [redacted]w[redacted]d weeks IUP here with report of waking around 0300 and finding blood in underwear and while wiping.  Blood is described as dark in nature.  Pt also reports having a lower abdominal pain x 1 that lasted "a couple of seconds".  Pain is no longer present at this time.  Good fetal movement.   Past Medical History  Diagnosis Date  . Anxiety   . Mental disorder   . Chronic tonsillitis   . Depression   . Cognitive deficits   . Bipolar 1 disorder     Past Surgical History  Procedure Laterality Date  . Cesarean section    . Tonsillectomy N/A 06/03/2012    Procedure: TONSILLECTOMY;  Surgeon: Osborn Coho, MD;  Location: [facility];  Service: ENT;  Laterality: N/A;  . Mass excision N/A 06/03/2012    Procedure: EXCISION MASS;  Surgeon: Osborn Coho, MD;  Location: [facility];  Service: ENT;  Laterality: N/A;  Excision uvula mass    Family History  Problem Relation Age of Onset  . Hypertension Mother   . Diabetes Father     History  Substance Use Topics  . Smoking status: Former Smoker -- 7 years    Quit date: 09/30/2011  . Smokeless tobacco: Never Used  . Alcohol Use: No    Allergies:  Allergies  Allergen Reactions  . Penicillins Hives    Face breaks out.    Prescriptions prior to admission  Medication Sig Dispense Refill  . busPIRone (BUSPAR) 10 MG tablet Take 10 mg by mouth daily.      . Prenatal Vit-Fe Fumarate-FA (PRENATAL MULTIVITAMIN) TABS tablet Take 1 tablet by mouth daily at 12 noon.  90 tablet  1  . sertraline (ZOLOFT) 25 MG tablet Take 25 mg by mouth daily.        Review of Systems  Gastrointestinal: Positive for abdominal pain (x1).  Genitourinary:       Vaginal bleeding  All other systems reviewed and are negative.   Physical Exam   Last  menstrual period 07/31/2012.  Physical Exam  Constitutional: She is oriented to person, place, and time. She appears well-developed and well-nourished. No distress.  HENT:  Head: Normocephalic.  Neck: Normal range of motion. Neck supple.  Cardiovascular: Normal rate, regular rhythm and normal heart sounds.   Respiratory: Effort normal and breath sounds normal. No respiratory distress.  GI: Soft. There is no tenderness.  Genitourinary: No bleeding around the vagina. Vaginal discharge (mucusy dark brown discharge) found.  Musculoskeletal: Normal range of motion. She exhibits edema (trace bilat).  Neurological: She is alert and oriented to person, place, and time.  Skin: Skin is warm and dry.    Cervix 1/50/-3; breech  FHR 140's, +accels, reactive Irregular  MAU Course  Procedures  Ultrasound:  Placenta above os, normal  Assessment and Plan  22 yo G3P2002 at [redacted]w[redacted]d wks IUP Bleeding During Pregnancy - Resolved and normal [place]  Plan: Discharge to home Provided Reassurance Bleeding precautions given CSection scheduled for 04/25/13  [facility] 04/20/2013, 9:09 AM

## 2013-04-24 ENCOUNTER — Encounter (HOSPITAL_COMMUNITY): Payer: Self-pay

## 2013-04-24 ENCOUNTER — Encounter (HOSPITAL_COMMUNITY)
Admission: RE | Admit: 2013-04-24 | Discharge: 2013-04-24 | Disposition: A | Discharge status: Home / Self Care | Payer: Medicaid Other | Source: Ambulatory Visit | Attending: Obstetrics and Gynecology | Admitting: Obstetrics and Gynecology

## 2013-04-24 LAB — CBC
HCT: 30.6 % — ABNORMAL LOW (ref 36.0–46.0)
MCH: 26 pg (ref 26.0–34.0)
MCV: 76.5 fL — ABNORMAL LOW (ref 78.0–100.0)
Platelets: 149 10*3/uL — ABNORMAL LOW (ref 150–400)
RBC: 4 MIL/uL (ref 3.87–5.11)
RDW: 14.4 % (ref 11.5–15.5)
WBC: 8.9 10*3/uL (ref 4.0–10.5)

## 2013-04-24 LAB — TYPE AND SCREEN: ABO/RH(D): A POS

## 2013-04-24 NOTE — Patient Instructions (Signed)
20 [name]  04/24/2013   Your procedure is scheduled on:  04/25/13  Enter through the Main Entrance of [facility] at 8 AM.  Pick up the phone at the desk and dial [phone].   Call this number if you have problems the morning of surgery: [phone]   Remember:   Do not eat food:After Midnight.  Do not drink clear liquids: After Midnight.  Take these medicines the morning of surgery with A SIP OF WATER: NA   Do not wear jewelry, make-up or nail polish.  Do not wear lotions, powders, or perfumes. You may wear deodorant.  Do not shave 48 hours prior to surgery.  Do not bring valuables to the hospital.  [facility] is not   responsible for any belongings or valuables brought to the hospital.  Contacts, dentures or bridgework may not be worn into surgery.  Leave suitcase in the car. After surgery it may be brought to your room.  For patients admitted to the hospital, checkout time is 11:00 AM the day of              discharge.   Patients discharged the day of surgery will not be allowed to drive             home.  Name and phone number of your driver: NA  Special Instructions:   Shower using CHG 2 nights before surgery and the night before surgery.  If you shower the day of surgery use CHG.  Use special wash - you have one bottle of CHG for all showers.  You should use approximately 1/3 of the bottle for each shower.   Please read over the following fact sheets that you were given:   Surgical Site Infection Prevention

## 2013-04-25 ENCOUNTER — Encounter (HOSPITAL_COMMUNITY): Payer: Self-pay | Admitting: *Deleted

## 2013-04-25 ENCOUNTER — Inpatient Hospital Stay (HOSPITAL_COMMUNITY): Payer: Medicaid Other | Admitting: Anesthesiology

## 2013-04-25 ENCOUNTER — Encounter (HOSPITAL_COMMUNITY): Payer: Medicaid Other | Admitting: Anesthesiology

## 2013-04-25 ENCOUNTER — Encounter (HOSPITAL_COMMUNITY)
Admission: RE | Disposition: A | Discharge status: Home / Self Care | Payer: Self-pay | Source: Ambulatory Visit | Attending: Obstetrics and Gynecology

## 2013-04-25 ENCOUNTER — Inpatient Hospital Stay (HOSPITAL_COMMUNITY)
Admission: RE | Admit: 2013-04-25 | Discharge: 2013-04-27 | DRG: 766 | Disposition: A | Discharge status: Home / Self Care | Payer: Medicaid Other | Source: Ambulatory Visit | Attending: Obstetrics and Gynecology | Admitting: Obstetrics and Gynecology

## 2013-04-25 DIAGNOSIS — Z98891 History of uterine scar from previous surgery: Secondary | ICD-10-CM

## 2013-04-25 DIAGNOSIS — Z87891 Personal history of nicotine dependence: Secondary | ICD-10-CM

## 2013-04-25 DIAGNOSIS — O4693 Antepartum hemorrhage, unspecified, third trimester: Secondary | ICD-10-CM

## 2013-04-25 DIAGNOSIS — O34219 Maternal care for unspecified type scar from previous cesarean delivery: Secondary | ICD-10-CM

## 2013-04-25 DIAGNOSIS — O99340 Other mental disorders complicating pregnancy, unspecified trimester: Secondary | ICD-10-CM

## 2013-04-25 SURGERY — Surgical Case
Anesthesia: Spinal | Site: Abdomen

## 2013-04-25 MED ORDER — MIDAZOLAM HCL 5 MG/5ML IJ SOLN
INTRAMUSCULAR | Status: DC | PRN
  Administered 2013-04-25: 2 mg via INTRAVENOUS

## 2013-04-25 MED ORDER — LANOLIN HYDROUS EX OINT: 1.0000 "application " | TOPICAL_OINTMENT | CUTANEOUS | Status: DC | PRN

## 2013-04-25 MED ORDER — SENNOSIDES-DOCUSATE SODIUM 8.6-50 MG PO TABS
2.0000 | ORAL_TABLET | ORAL | Status: DC
  Administered 2013-04-25 – 2013-04-26 (×2): 2 via ORAL
  Filled 2013-04-25 (×2): qty 2

## 2013-04-25 MED ORDER — DIPHENHYDRAMINE HCL 25 MG PO CAPS
25.0000 mg | ORAL_CAPSULE | ORAL | Status: DC | PRN
  Administered 2013-04-25: 25 mg via ORAL
  Filled 2013-04-25 (×2): qty 1

## 2013-04-25 MED ORDER — SCOPOLAMINE 1 MG/3DAYS TD PT72
1.0000 | MEDICATED_PATCH | Freq: Once | TRANSDERMAL | Status: DC
  Administered 2013-04-25: 1.5 mg via TRANSDERMAL

## 2013-04-25 MED ORDER — BUPIVACAINE HCL (PF) 0.5 % IJ SOLN
INTRAMUSCULAR | Status: AC
  Filled 2013-04-25: qty 30

## 2013-04-25 MED ORDER — BUPIVACAINE IN DEXTROSE 0.75-8.25 % IT SOLN
INTRATHECAL | Status: DC | PRN
  Administered 2013-04-25: 1.6 mL via INTRATHECAL

## 2013-04-25 MED ORDER — ONDANSETRON HCL 4 MG/2ML IJ SOLN
INTRAMUSCULAR | Status: DC | PRN
  Administered 2013-04-25: 4 mg via INTRAVENOUS

## 2013-04-25 MED ORDER — FENTANYL CITRATE 0.05 MG/ML IJ SOLN
INTRAMUSCULAR | Status: AC
  Filled 2013-04-25: qty 2

## 2013-04-25 MED ORDER — CEFAZOLIN SODIUM-DEXTROSE 2-3 GM-% IV SOLR
2.0000 g | INTRAVENOUS | Status: AC
  Administered 2013-04-25: 2 g via INTRAVENOUS

## 2013-04-25 MED ORDER — SIMETHICONE 80 MG PO CHEW
80.0000 mg | CHEWABLE_TABLET | Freq: Three times a day (TID) | ORAL | Status: DC
  Administered 2013-04-26 – 2013-04-27 (×3): 80 mg via ORAL
  Filled 2013-04-25 (×3): qty 1

## 2013-04-25 MED ORDER — MIDAZOLAM HCL 2 MG/2ML IJ SOLN: 0.5000 mg | Freq: Once | INTRAMUSCULAR | Status: DC | PRN

## 2013-04-25 MED ORDER — NALOXONE HCL 1 MG/ML IJ SOLN
1.0000 ug/kg/h | INTRAMUSCULAR | Status: DC | PRN
  Filled 2013-04-25: qty 2

## 2013-04-25 MED ORDER — WITCH HAZEL-GLYCERIN EX PADS: 1.0000 "application " | MEDICATED_PAD | CUTANEOUS | Status: DC | PRN

## 2013-04-25 MED ORDER — SCOPOLAMINE 1 MG/3DAYS TD PT72: 1.0000 | MEDICATED_PATCH | Freq: Once | TRANSDERMAL | Status: DC

## 2013-04-25 MED ORDER — ZOLPIDEM TARTRATE 5 MG PO TABS: 5.0000 mg | ORAL_TABLET | Freq: Every evening | ORAL | Status: DC | PRN

## 2013-04-25 MED ORDER — BUSPIRONE HCL 10 MG PO TABS
10.0000 mg | ORAL_TABLET | Freq: Every day | ORAL | Status: DC
  Administered 2013-04-26 – 2013-04-27 (×2): 10 mg via ORAL
  Filled 2013-04-25 (×4): qty 1

## 2013-04-25 MED ORDER — LACTATED RINGERS IV SOLN
INTRAVENOUS | Status: DC
  Administered 2013-04-25 (×2): via INTRAVENOUS

## 2013-04-25 MED ORDER — PRENATAL MULTIVITAMIN CH
1.0000 | ORAL_TABLET | Freq: Every day | ORAL | Status: DC
  Administered 2013-04-26 – 2013-04-27 (×2): 1 via ORAL
  Filled 2013-04-25 (×2): qty 1

## 2013-04-25 MED ORDER — ONDANSETRON HCL 4 MG/2ML IJ SOLN: 4.0000 mg | INTRAMUSCULAR | Status: DC | PRN

## 2013-04-25 MED ORDER — MEPERIDINE HCL 25 MG/ML IJ SOLN: 6.2500 mg | INTRAMUSCULAR | Status: DC | PRN

## 2013-04-25 MED ORDER — SODIUM CHLORIDE 0.9 % IJ SOLN: 3.0000 mL | INTRAMUSCULAR | Status: DC | PRN

## 2013-04-25 MED ORDER — DIBUCAINE 1 % RE OINT: 1.0000 "application " | TOPICAL_OINTMENT | RECTAL | Status: DC | PRN

## 2013-04-25 MED ORDER — SIMETHICONE 80 MG PO CHEW: 80.0000 mg | CHEWABLE_TABLET | ORAL | Status: DC | PRN

## 2013-04-25 MED ORDER — MIDAZOLAM HCL 2 MG/2ML IJ SOLN
INTRAMUSCULAR | Status: AC
  Filled 2013-04-25: qty 2

## 2013-04-25 MED ORDER — KETOROLAC TROMETHAMINE 30 MG/ML IJ SOLN: 30.0000 mg | Freq: Four times a day (QID) | INTRAMUSCULAR | Status: AC | PRN

## 2013-04-25 MED ORDER — NALBUPHINE HCL 10 MG/ML IJ SOLN
5.0000 mg | INTRAMUSCULAR | Status: DC | PRN
  Filled 2013-04-25: qty 1

## 2013-04-25 MED ORDER — SERTRALINE HCL 25 MG PO TABS
25.0000 mg | ORAL_TABLET | Freq: Every day | ORAL | Status: DC
Start: 2013-04-25 — End: 2013-04-27
  Administered 2013-04-26 – 2013-04-27 (×2): 25 mg via ORAL
  Filled 2013-04-25 (×4): qty 1

## 2013-04-25 MED ORDER — OXYTOCIN 10 UNIT/ML IJ SOLN
INTRAMUSCULAR | Status: AC
  Filled 2013-04-25: qty 4

## 2013-04-25 MED ORDER — LACTATED RINGERS IV SOLN
INTRAVENOUS | Status: DC
  Administered 2013-04-25: 20:00:00 via INTRAVENOUS

## 2013-04-25 MED ORDER — OXYTOCIN 40 UNITS IN LACTATED RINGERS INFUSION - SIMPLE MED: 62.5000 mL/h | INTRAVENOUS | Status: AC

## 2013-04-25 MED ORDER — PROMETHAZINE HCL 25 MG/ML IJ SOLN: 6.2500 mg | INTRAMUSCULAR | Status: DC | PRN

## 2013-04-25 MED ORDER — IBUPROFEN 600 MG PO TABS
600.0000 mg | ORAL_TABLET | Freq: Four times a day (QID) | ORAL | Status: DC
  Administered 2013-04-25 – 2013-04-27 (×7): 600 mg via ORAL
  Filled 2013-04-25 (×7): qty 1

## 2013-04-25 MED ORDER — DIPHENHYDRAMINE HCL 25 MG PO CAPS: 25.0000 mg | ORAL_CAPSULE | Freq: Four times a day (QID) | ORAL | Status: DC | PRN

## 2013-04-25 MED ORDER — KETOROLAC TROMETHAMINE 30 MG/ML IJ SOLN
30.0000 mg | Freq: Four times a day (QID) | INTRAMUSCULAR | Status: AC | PRN
  Administered 2013-04-25: 30 mg via INTRAVENOUS

## 2013-04-25 MED ORDER — MORPHINE SULFATE (PF) 0.5 MG/ML IJ SOLN
INTRAMUSCULAR | Status: DC | PRN
  Administered 2013-04-25: .15 mg via INTRATHECAL

## 2013-04-25 MED ORDER — PHENYLEPHRINE 8 MG IN D5W 100 ML (0.08MG/ML) PREMIX OPTIME
INJECTION | INTRAVENOUS | Status: AC
  Filled 2013-04-25: qty 100

## 2013-04-25 MED ORDER — LACTATED RINGERS IV SOLN
INTRAVENOUS | Status: DC
  Administered 2013-04-25: 12:00:00 via INTRAVENOUS

## 2013-04-25 MED ORDER — KETOROLAC TROMETHAMINE 30 MG/ML IJ SOLN
INTRAMUSCULAR | Status: AC
  Filled 2013-04-25: qty 1

## 2013-04-25 MED ORDER — ACETAMINOPHEN 500 MG PO TABS
1000.0000 mg | ORAL_TABLET | Freq: Four times a day (QID) | ORAL | Status: AC
  Administered 2013-04-25 – 2013-04-26 (×2): 1000 mg via ORAL
  Filled 2013-04-25 (×2): qty 2

## 2013-04-25 MED ORDER — KETAMINE HCL 10 MG/ML IJ SOLN
INTRAMUSCULAR | Status: AC
  Filled 2013-04-25: qty 1

## 2013-04-25 MED ORDER — ONDANSETRON HCL 4 MG/2ML IJ SOLN
INTRAMUSCULAR | Status: AC
  Filled 2013-04-25: qty 2

## 2013-04-25 MED ORDER — DIPHENHYDRAMINE HCL 50 MG/ML IJ SOLN: 12.5000 mg | INTRAMUSCULAR | Status: DC | PRN

## 2013-04-25 MED ORDER — DIPHENHYDRAMINE HCL 50 MG/ML IJ SOLN: 25.0000 mg | INTRAMUSCULAR | Status: DC | PRN

## 2013-04-25 MED ORDER — ONDANSETRON HCL 4 MG PO TABS: 4.0000 mg | ORAL_TABLET | ORAL | Status: DC | PRN

## 2013-04-25 MED ORDER — FENTANYL CITRATE 0.05 MG/ML IJ SOLN
25.0000 ug | INTRAMUSCULAR | Status: DC | PRN
  Administered 2013-04-25 (×3): 50 ug via INTRAVENOUS

## 2013-04-25 MED ORDER — LACTATED RINGERS IV SOLN
INTRAVENOUS | Status: DC | PRN
  Administered 2013-04-25: 10:00:00 via INTRAVENOUS

## 2013-04-25 MED ORDER — SCOPOLAMINE 1 MG/3DAYS TD PT72
MEDICATED_PATCH | TRANSDERMAL | Status: AC
  Administered 2013-04-25: 1.5 mg via TRANSDERMAL
  Filled 2013-04-25: qty 1

## 2013-04-25 MED ORDER — IBUPROFEN 600 MG PO TABS: 600.0000 mg | ORAL_TABLET | Freq: Four times a day (QID) | ORAL | Status: DC | PRN

## 2013-04-25 MED ORDER — CEFAZOLIN SODIUM-DEXTROSE 2-3 GM-% IV SOLR
INTRAVENOUS | Status: AC
  Filled 2013-04-25: qty 50

## 2013-04-25 MED ORDER — PHENYLEPHRINE 8 MG IN D5W 100 ML (0.08MG/ML) PREMIX OPTIME
INJECTION | INTRAVENOUS | Status: DC | PRN
  Administered 2013-04-25: 60 ug/min via INTRAVENOUS

## 2013-04-25 MED ORDER — OXYCODONE-ACETAMINOPHEN 5-325 MG PO TABS: 1.0000 | ORAL_TABLET | ORAL | Status: DC | PRN

## 2013-04-25 MED ORDER — MENTHOL 3 MG MT LOZG: 1.0000 | LOZENGE | OROMUCOSAL | Status: DC | PRN

## 2013-04-25 MED ORDER — METOCLOPRAMIDE HCL 5 MG/ML IJ SOLN: 10.0000 mg | Freq: Three times a day (TID) | INTRAMUSCULAR | Status: DC | PRN

## 2013-04-25 MED ORDER — ONDANSETRON HCL 4 MG/2ML IJ SOLN: 4.0000 mg | Freq: Three times a day (TID) | INTRAMUSCULAR | Status: DC | PRN

## 2013-04-25 MED ORDER — NALOXONE HCL 0.4 MG/ML IJ SOLN: 0.4000 mg | INTRAMUSCULAR | Status: DC | PRN

## 2013-04-25 MED ORDER — TETANUS-DIPHTH-ACELL PERTUSSIS 5-2.5-18.5 LF-MCG/0.5 IM SUSP: 0.5000 mL | Freq: Once | INTRAMUSCULAR | Status: DC

## 2013-04-25 MED ORDER — LACTATED RINGERS IV SOLN
INTRAVENOUS | Status: DC
  Administered 2013-04-25: 09:00:00 via INTRAVENOUS

## 2013-04-25 MED ORDER — SIMETHICONE 80 MG PO CHEW
80.0000 mg | CHEWABLE_TABLET | ORAL | Status: DC
  Administered 2013-04-25 – 2013-04-27 (×2): 80 mg via ORAL
  Filled 2013-04-25 (×2): qty 1

## 2013-04-25 MED ORDER — MORPHINE SULFATE 0.5 MG/ML IJ SOLN
INTRAMUSCULAR | Status: AC
  Filled 2013-04-25: qty 10

## 2013-04-25 MED ORDER — FENTANYL CITRATE 0.05 MG/ML IJ SOLN
INTRAMUSCULAR | Status: DC | PRN
  Administered 2013-04-25: 100 ug via INTRAVENOUS
  Administered 2013-04-25: 75 ug via INTRAVENOUS
  Administered 2013-04-25: 25 ug via INTRATHECAL

## 2013-04-25 MED ORDER — OXYTOCIN 10 UNIT/ML IJ SOLN
40.0000 [IU] | INTRAVENOUS | Status: DC | PRN
  Administered 2013-04-25: 40 [IU] via INTRAVENOUS

## 2013-04-25 MED ORDER — KETAMINE HCL 10 MG/ML IJ SOLN
INTRAMUSCULAR | Status: DC | PRN
  Administered 2013-04-25 (×3): 20 mg via INTRAVENOUS

## 2013-04-25 SURGICAL SUPPLY — 28 items
BRR ADH 6X5 SEPRAFILM 1 SHT (MISCELLANEOUS)
CLAMP CORD UMBIL (MISCELLANEOUS) IMPLANT
CONTAINER PREFILL 10% NBF 15ML (MISCELLANEOUS) IMPLANT
DRAPE LG THREE QUARTER DISP (DRAPES) IMPLANT
DRSG OPSITE POSTOP 4X10 (GAUZE/BANDAGES/DRESSINGS) ×2 IMPLANT
DURAPREP 26ML APPLICATOR (WOUND CARE) ×2 IMPLANT
ELECT REM PT RETURN 9FT ADLT (ELECTROSURGICAL) ×2
ELECTRODE REM PT RTRN 9FT ADLT (ELECTROSURGICAL) ×1 IMPLANT
EXTRACTOR VACUUM M CUP 4 TUBE (SUCTIONS) IMPLANT
GLOVE BIOGEL PI IND STRL 6.5 (GLOVE) ×1 IMPLANT
GLOVE BIOGEL PI INDICATOR 6.5 (GLOVE) ×1
GLOVE SURG SS PI 6.0 STRL IVOR (GLOVE) ×2 IMPLANT
GOWN PREVENTION PLUS XLARGE (GOWN DISPOSABLE) ×4 IMPLANT
GOWN STRL REIN XL XLG (GOWN DISPOSABLE) ×4 IMPLANT
KIT ABG SYR 3ML LUER SLIP (SYRINGE) IMPLANT
NEEDLE HYPO 25X5/8 SAFETYGLIDE (NEEDLE) IMPLANT
NS IRRIG 1000ML POUR BTL (IV SOLUTION) ×2 IMPLANT
PACK C SECTION WH (CUSTOM PROCEDURE TRAY) ×2 IMPLANT
PAD OB MATERNITY 4.3X12.25 (PERSONAL CARE ITEMS) ×2 IMPLANT
RTRCTR C-SECT PINK 25CM LRG (MISCELLANEOUS) IMPLANT
SEPRAFILM MEMBRANE 5X6 (MISCELLANEOUS) IMPLANT
STAPLER VISISTAT 35W (STAPLE) IMPLANT
SUT PLAIN 0 NONE (SUTURE) IMPLANT
SUT VIC AB 0 CT1 36 (SUTURE) ×10 IMPLANT
SUT VIC AB 4-0 KS 27 (SUTURE) ×2 IMPLANT
TOWEL OR 17X24 6PK STRL BLUE (TOWEL DISPOSABLE) ×2 IMPLANT
TRAY FOLEY CATH 14FR (SET/KITS/TRAYS/PACK) ×2 IMPLANT
WATER STERILE IRR 1000ML POUR (IV SOLUTION) ×2 IMPLANT

## 2013-04-25 NOTE — Anesthesia Postprocedure Evaluation (Signed)
  Anesthesia Post-op Note  Patient: [name]  Procedure(s) Performed: Procedure(s): REPEAT CESAREAN SECTION (N/A)  Patient Location: PACU and Mother/Baby  Anesthesia Type:Spinal  Level of Consciousness: awake, alert , oriented and patient cooperative  Airway and Oxygen Therapy: Patient Spontanous Breathing  Post-op Pain: none  Post-op Assessment: Post-op Vital signs reviewed, Patient's Cardiovascular Status Stable and Respiratory Function Stable  Post-op Vital Signs: Reviewed and stable  Complications: No apparent anesthesia complications

## 2013-04-25 NOTE — Anesthesia Postprocedure Evaluation (Signed)
Anesthesia Post Note  Patient: [name]  Procedure(s) Performed: Procedure(s) (LRB): REPEAT CESAREAN SECTION (N/A)  Anesthesia type: SAB  Patient location: Mother/Baby  Post pain: Pain level controlled  Post assessment: Post-op Vital signs reviewed  Last Vitals:  Filed Vitals:   04/25/13 1237  BP: 116/59  Pulse: 79  Temp: 36.4 C  Resp: 20    Post vital signs: Reviewed  Level of consciousness: awake  Complications: No apparent anesthesia complications

## 2013-04-25 NOTE — Op Note (Signed)
[name] PROCEDURE DATE: 04/25/2013  PREOPERATIVE DIAGNOSIS: Intrauterine pregnancy at  [redacted]w[redacted]d weeks gestation; elective repeat  POSTOPERATIVE DIAGNOSIS: The same  PROCEDURE: Repeat Low Transverse Cesarean Section  SURGEON:  Dr. Jolayne Panther  ASSISTANT: Dr Candelaria Celeste  INDICATIONS: [name] is a 22 y.o. G3P3003 at [redacted]w[redacted]d scheduled for cesarean section secondary to elective repeat.  The risks of cesarean section discussed with the patient included but were not limited to: bleeding which may require transfusion or reoperation; infection which may require antibiotics; injury to bowel, bladder, ureters or other surrounding organs; injury to the fetus; need for additional procedures including hysterectomy in the event of a life-threatening hemorrhage; placental abnormalities wth subsequent pregnancies, incisional problems, thromboembolic phenomenon and other postoperative/anesthesia complications. The patient concurred with the proposed plan, giving informed written consent for the procedure.    FINDINGS:  Viable female infant in cephalic presentation.  Apgars 8 and 9, weight, 7 pounds and 3 ounces.  Clear amniotic fluid.  Intact placenta, three vessel cord.  Normal uterus, fallopian tubes and ovaries on right.  Uterus adhered to anterior abdominal wall on left.  ANESTHESIA:    Spinal INTRAVENOUS FLUIDS: 2600 ml ESTIMATED BLOOD LOSS: 1000 ml URINE OUTPUT:  150 ml SPECIMENS: Placenta sent to L&D COMPLICATIONS: None immediate  PROCEDURE IN DETAIL:  The patient received intravenous antibiotics and had sequential compression devices applied to her lower extremities while in the preoperative area.  She was then taken to the operating room where spinal anesthesia was administered (epidural anesthesia was dosed up to surgical level) and was found to be adequate. She was then placed in a dorsal supine position with a leftward tilt, and prepped and draped in a sterile manner.  A foley catheter was  placed into her bladder and attached to constant gravity, which drained clear fluid throughout.  After an adequate timeout was performed, a Pfannenstiel skin incision was made with scalpel and carried through to the underlying layer of fascia. The fascia was incised in the midline and this incision was extended bilaterally using the Mayo scissors. Kocher clamps were applied to the superior aspect of the fascial incision and the underlying rectus muscles were dissected off bluntly. A similar process was carried out on the inferior aspect of the facial incision. The rectus muscles were separated in the midline bluntly and the peritoneum was entered bluntly and sharply with bovie.  The uterus was noted to be densely adhered to the anterior abdominal wall on the left, preventing visualization of the left adnexa.  Attention was turned to the lower uterine segment where a transverse hysterotomy was made with a scalpel and extended bilaterally bluntly. The bladder blade was then removed. The infant was successfully delivered, and cord was clamped and cut and infant was handed over to awaiting neonatology team. Uterine massage was then administered and the placenta delivered intact with three-vessel cord. The uterus was then cleared of clot and debris.  The hysterotomy was closed with 0 Vicryl in a running locked fashion, and an imbricating layer was also placed with a 0 Vicryl.  Hemostasis was acheived. The abdomen and the pelvis were irrigated and cleared of all clot and debris. Hemostasis was confirmed on all surfaces.  The fascia was then closed using 0 Vicryl in a running fashion.  The skin was closed with 4-0 Vicryl and a wound vac was placed due to the patient's pannus to prevent infection.  The patient tolerated the procedure well. Sponge, lap, instrument and needle counts were correct x  2. She was taken to the recovery room in stable condition.    Melroy Bougher [name] 04/25/2013 11:01 AM

## 2013-04-25 NOTE — Anesthesia Preprocedure Evaluation (Addendum)
Anesthesia Evaluation  Patient identified by MRN, date of birth, ID band Patient awake    Reviewed: Allergy & Precautions, H&P , NPO status , Patient's Chart, lab work & pertinent test results  Airway Mallampati: I TM Distance: >3 FB Neck ROM: full    Dental  (+) Teeth Intact   Pulmonary former smoker,  breath sounds clear to auscultation        Cardiovascular Exercise Tolerance: Good negative cardio ROS  Rhythm:regular Rate:Normal     Neuro/Psych PSYCHIATRIC DISORDERS Anxiety Depression Bipolar Disorder Cognitive deficitsnegative neurological ROS     GI/Hepatic negative GI ROS, Neg liver ROS,   Endo/Other  BMI 36.4  Renal/GU negative Renal ROS  negative genitourinary   Musculoskeletal   Abdominal   Peds  Hematology  (+) anemia ,   Anesthesia Other Findings   Reproductive/Obstetrics (+) Pregnancy (h/o prior c/s x2,  for repeat)                         Anesthesia Physical Anesthesia Plan  ASA: II  Anesthesia Plan: Spinal   Post-op Pain Management:    Induction:   Airway Management Planned:   Additional Equipment:   Intra-op Plan:   Post-operative Plan:   Informed Consent: I have reviewed the patients History and Physical, chart, labs and discussed the procedure including the risks, benefits and alternatives for the proposed anesthesia with the patient or authorized representative who has indicated his/her understanding and acceptance.     Plan Discussed with: Anesthesiologist, CRNA and Surgeon  Anesthesia Plan Comments:         Anesthesia Quick Evaluation

## 2013-04-25 NOTE — H&P (Addendum)
[name] is a 22 y.o. female G1P2002 with IUP at [redacted]w[redacted]d presenting for repeat cesarean section. Pt states she has been having no contractions, no vaginal bleeding, intact membranes, with good fretal activity..  Pregnancy complicated by use of zoloft during pregnancy.  She has had two prior cesarean sections.  Recent BPP on 04/20/13 was 8/8.  Prenatal Course Source [facility] Care: [facility]   Pregnancy complications or risks: Patient Active Problem List   Diagnosis Date Noted  . Third trimester bleeding, antepartum 02/19/2013  . Previous cesarean section complicating pregnancy 01/31/2013  . Mental disorders of mother, antepartum(648.43) 10/24/2012  . Cognitive deficits 10/12/2012  . Bipolar I disorder, most recent episode (or current) depressed, unspecified 07/02/2012    Class: Chronic  . Generalized anxiety disorder 06/28/2012   She desires to IUD.  She plans to plans to bottle feed  Prenatal labs and studies: ABO, Rh: --/--/A POS (12/29 1102) Antibody: NEG (12/29 1102) Rubella:   RPR: NON REACTIVE (12/29 1100)  HBsAg: NEGATIVE (06/18 1703)  HIV: NON REACTIVE (06/18 1703)  GBS:   Neg 1 hr Glucola 138 with normal 3hr Genetic screening: declined Anatomy US normal  Past Medical History:  Past Medical History  Diagnosis Date  . Anxiety   . Mental disorder   . Depression   . Cognitive deficits   . Bipolar 1 disorder     Past Surgical History:  Past Surgical History  Procedure Laterality Date  . Cesarean section    . Tonsillectomy N/A 06/03/2012    Procedure: TONSILLECTOMY;  Surgeon: Osborn Coho, MD;  Location: [facility];  Service: ENT;  Laterality: N/A;  . Mass excision N/A 06/03/2012    Procedure: EXCISION MASS;  Surgeon: Osborn Coho, MD;  Location:  SURGERY CENTER;  Service: ENT;  Laterality: N/A;  Excision uvula mass    Obstetrical History:  OB History   Grav Para Term Preterm Abortions TAB SAB Ect Mult Living   3 2 2  0 0 0 0 0 0 2       Social History:  History   Social History  . Marital Status: Single    Spouse Name: N/A    Number of Children: N/A  . Years of Education: N/A   Social History Main Topics  . Smoking status: Former Smoker -- 7 years    Quit date: 09/30/2011  . Smokeless tobacco: Never Used  . Alcohol Use: No  . Drug Use: No  . Sexual Activity: Yes    Birth Control/ Protection: None   Other Topics Concern  . None   Social History Narrative  . None    Family History:  Family History  Problem Relation Age of Onset  . Hypertension Mother   . Diabetes Father     Medications:  Prenatal vitamins,  Current Facility-Administered Medications  Medication Dose Route Frequency Provider Last Rate Last Dose  . lactated ringers infusion   Intravenous Continuous Dana Allan, MD      . lactated ringers infusion   Intravenous Continuous Dana Allan, MD 1,000 mL/hr at 04/25/13 0840    . scopolamine (TRANSDERM-SCOP) 1.5 MG 1.5 mg  1 patch Transdermal Once Dana Allan, MD   1.5 mg at 04/25/13 0840    Allergies:  Allergies  Allergen Reactions  . Penicillins Hives    Face breaks out.    Review of Systems: - Negative  Physical Exam: Blood pressure 124/75, pulse 99, temperature 98.1 F (36.7 C), temperature source Oral, resp. rate 20,  last menstrual period 07/31/2012, SpO2 99.00%. GENERAL: Well-developed, well-nourished female in no acute distress.  LUNGS: Clear to auscultation bilaterally.  HEART: Regular rate and rhythm. ABDOMEN: Soft, nontender, nondistended, gravid. EFW 8 lbs EXTREMITIES: Nontender, no edema, 2+ distal pulses. Presentation: cephalic FHT:  Baseline rate 152 bpm      Pertinent Labs/Studies:  none  Assessment : [name] is a 22 y.o. G3P2002 at [redacted]w[redacted]d being admitted for Repeat cesarean section  Plan: - Elective repeat Cesarean section.  Risks discussed with the patient.  Consent signed  - FWB: Reassuring fetal heart tracing.  GBS negative  STINSON, JACOB  JEHIEL 04/25/2013, 8:56 AM      Attestation of Attending Supervision of Advanced Practitioner (CNM/NP): Evaluation and management procedures were performed by the Advanced Practitioner under my supervision and collaboration.  I have reviewed the Advanced Practitioner's note and chart, and I agree with the management and plan.  Lillianna Sabel 04/25/2013 9:19 AM

## 2013-04-25 NOTE — Anesthesia Procedure Notes (Signed)
Spinal  Patient location during procedure: OR Start time: 04/25/2013 9:33 AM Staffing Anesthesiologist: Brayton Caves Performed by: anesthesiologist  Preanesthetic Checklist Completed: patient identified, site marked, surgical consent, pre-op evaluation, timeout performed, IV checked, risks and benefits discussed and monitors and equipment checked Spinal Block Patient position: sitting Prep: DuraPrep Patient monitoring: heart rate, cardiac monitor, continuous pulse ox and blood pressure Approach: midline Location: L3-4 Injection technique: single-shot Needle Needle type: Sprotte  Needle gauge: 24 G Needle length: 9 cm Assessment Sensory level: T4 Additional Notes Patient identified.  Risk benefits discussed including failed block, incomplete pain control, headache, nerve damage, paralysis, blood pressure changes, nausea, vomiting, reactions to medication both toxic or allergic, and postpartum back pain.  Patient expressed understanding and wished to proceed.  All questions were answered.  Sterile technique used throughout procedure.  CSF was clear.  No parasthesia or other complications.  Please see nursing notes for vital signs.

## 2013-04-25 NOTE — Transfer of Care (Signed)
Immediate Anesthesia Transfer of Care Note  Patient: [name]  Procedure(s) Performed: Procedure(s): REPEAT CESAREAN SECTION (N/A)  Patient Location: PACU  Anesthesia Type:Spinal  Level of Consciousness: awake, alert  and oriented  Airway & Oxygen Therapy: Patient Spontanous Breathing and Patient connected to nasal cannula oxygen  Post-op Assessment: Report given to PACU RN and Post -op Vital signs reviewed and stable  Post vital signs: Reviewed and stable  Complications: No apparent anesthesia complications

## 2013-04-26 ENCOUNTER — Encounter (HOSPITAL_COMMUNITY): Payer: Self-pay | Admitting: Obstetrics and Gynecology

## 2013-04-26 LAB — CBC
HCT: 21.3 % — ABNORMAL LOW (ref 36.0–46.0)
Hemoglobin: 7.1 g/dL — ABNORMAL LOW (ref 12.0–15.0)
MCH: 25.4 pg — ABNORMAL LOW (ref 26.0–34.0)
MCHC: 33.3 g/dL (ref 30.0–36.0)
MCV: 76.3 fL — ABNORMAL LOW (ref 78.0–100.0)
RDW: 14.5 % (ref 11.5–15.5)

## 2013-04-26 LAB — BIRTH TISSUE RECOVERY COLLECTION (PLACENTA DONATION)

## 2013-04-26 NOTE — Progress Notes (Signed)
Subjective: Postpartum Day 1: Cesarean Delivery Patient reports tolerating PO, + flatus and no problems voiding.    Objective: Vital signs in last 24 hours: Temp:  [97.5 F (36.4 C)-98.7 F (37.1 C)] 98.7 F (37.1 C) (12/31 0530) Pulse Rate:  [68-91] 78 (12/31 0530) Resp:  [15-22] 18 (12/31 0530) BP: (97-127)/(44-69) 99/65 mmHg (12/31 0530) SpO2:  [96 %-100 %] 98 % (12/31 0136) Weight:  [105.235 kg (232 lb)] 105.235 kg (232 lb) (12/30 1100)  Physical Exam:  General: alert, cooperative and no distress Lochia: appropriate Uterine Fundus: firm Incision: no significant drainage DVT Evaluation: No evidence of DVT seen on physical exam. Negative Homan's sign. No cords or calf tenderness. No significant calf/ankle edema.   Recent Labs  04/24/13 1100 04/26/13 0600  HGB 10.4* 7.1*  HCT 30.6* 21.3*    Assessment/Plan: Status post Cesarean section. Doing well postoperatively.  Continue current care.  LEFTWICH-KIRBY, LISA 04/26/2013, 9:17 AM

## 2013-04-26 NOTE — Progress Notes (Signed)
UR chart review completed.  

## 2013-04-26 NOTE — Progress Notes (Signed)
Clinical Social Work Department PSYCHOSOCIAL ASSESSMENT - MATERNAL/CHILD 04/26/2013  Patient:  [name]  Account Number:  [id number]  Admit Date:  04/25/2013  Marjo Bicker Name:   [name]    Clinical Social Worker:  Unk Lightning, LCSW   Date/Time:  04/26/2013 09:40 AM  Date Referred:  04/26/2013   Referral source  Physician     Referred reason  Depression/Anxiety   Other referral source:    I:  FAMILY / HOME ENVIRONMENT Child's legal guardian:  PARENT  Guardian - Name Guardian - Age Guardian - Address  [name] 22 [address] [place], [place] [address]   Other household support members/support persons Name Relationship DOB  6 yr old DAUGHTER   81 yr old DAUGHTER    Other support:   Patient reports that mother and sister are supportive.    II  PSYCHOSOCIAL DATA Information Source:  Patient Interview  Event organiser Employment:   Patient is unemployed   Surveyor, quantity resources:  [employer] If Medicaid - [employer]:  [place] Other  [employer]  Food Stamps   School / Grade:   Maternity Care Coordinator / Child Services Coordination / Early Interventions:   MOB reports she already receives [facility] and food stamps.  Cultural issues impacting care:   None reported    III  STRENGTHS Strengths  Home prepared for Child (including basic supplies)  Supportive family/friends   Strength comment:  MOB reports two girls at home and states that she will re-use most of their supplies.   IV  RISK FACTORS AND CURRENT PROBLEMS Current Problem:  YES   Risk Factor & Current Problem Patient Issue Family Issue Risk Factor / Current Problem Comment  Mental Illness Y N History of depression    V  SOCIAL WORK ASSESSMENT CSW met with MOB and baby at bedside. CSW introduced myself and explained role.    MOB reports that she has [name] 24 yr old and [name] 24 yr old at home and states that mother and sister are supportive. FOB is not involved with care but MOB feels with family  support she will have adequate help.    MOB reports [name] history of depression and currently prescribed Buspar. MOB does not plan to breast feed but if she changes her mind she is aware she will need to speak with MD to ensure medication is safe for baby as well. MOB has been receiving treatment at [facility] and plans to follow up with that agency.    CSW and MOB discussed postpartum depression and MOB reports no issues with postpartum depression with previous children. CSW provided "Feelings After Birth" brochure and encouraged patient to speak with psychiatrist or therapist if noticing any symptoms.    MOB reports no further needs but thanked CSW for time.      VI SOCIAL WORK PLAN Social Work Personnel officer Education   Type of pt/family education:   Postpartum depression symptoms   If child protective services report - county:   If child protective services report - date:   Information/referral to community resources comment:   "Feelings After Birth" brochure   Other social work plan:   CSW is signing off but available if further needs arise.    Unk Lightning, LCSW  ([place] [employer])

## 2013-04-27 MED ORDER — IBUPROFEN 600 MG PO TABS: 600.0000 mg | ORAL_TABLET | Freq: Four times a day (QID) | ORAL | Status: DC | PRN

## 2013-04-27 MED ORDER — OXYCODONE-ACETAMINOPHEN 5-325 MG PO TABS: 1.0000 | ORAL_TABLET | ORAL | Status: DC | PRN

## 2013-04-27 NOTE — Discharge Summary (Signed)
Attestation of Attending Supervision of Obstetric Fellow: Evaluation and management procedures were performed by the Obstetric Fellow under my supervision and collaboration.  I have reviewed the Obstetric Fellow's note and chart, and I agree with the management and plan.  Kitty Cadavid, MD, FACOG Attending Obstetrician & Gynecologist Faculty Practice, [facility]    

## 2013-04-27 NOTE — Discharge Summary (Signed)
  Obstetric Discharge Summary Reason for Admission: cesarean section Prenatal Procedures: none Intrapartum Procedures: cesarean: low cervical, transverse Postpartum Procedures: none Complications-Operative and Postpartum: none  Hospital Course: [name] is a 23 y.o. female G86P2002 with IUP at [redacted]w[redacted]d presenting for repeat cesarean section. Pt states she has been having no contractions, no vaginal bleeding, intact membranes, with good fretal activity.. Pregnancy complicated by use of zoloft during pregnancy. She has had two prior cesarean sections. Recent BPP on 04/20/13 was 8/8.   Breast feeding and nexplanon for MOC  Delivery Note At 10:03 AM a viable female was delivered via C-Section, Low Transverse (Presentation: ;  ).  APGAR: 8, 9; weight 7 lb 3.7 oz (3280 g).   Placenta status: Intact, Manual removal.  Cord: 3 vessels with the following complications: [name]    Mom to postpartum.  Baby to Couplet care / Skin to Skin.  [name] 04/27/2013, 8:50 AM     H/H: Lab Results  Component Value Date/Time   HGB 7.1* 04/26/2013  6:00 AM   HCT 21.3* 04/26/2013  6:00 AM      Discharge Diagnoses: Term Pregnancy-delivered  Discharge Information: Date: 11/06/2010 Activity: pelvic rest Diet: routine  Medications: PNV, Ibuprofen and Percocet Breast feeding:  Yes Condition: stable Instructions: refer to handout Discharge to: home      Medication List         busPIRone 10 MG tablet  Commonly known as:  BUSPAR  Take 10 mg by mouth daily.     ibuprofen 600 MG tablet  Commonly known as:  ADVIL,MOTRIN  Take 1 tablet (600 mg total) by mouth every 6 (six) hours as needed for mild pain.     prenatal multivitamin Tabs tablet  Take 1 tablet by mouth daily at 12 noon.     sertraline 25 MG tablet  Commonly known as:  ZOLOFT  Take 25 mg by mouth daily.           Follow-up Information   Follow up with [facility] In 4 weeks.   Specialty:  Obstetrics and  Gynecology   Contact information:   [place] [address] [phone]      [name] 04/27/2013,8:50 AM

## 2013-04-27 NOTE — Discharge Instructions (Signed)
Vaginal Delivery During delivery, your health care provider will help you give birth to your baby. During a vaginal delivery, you will work to push the baby out of your vagina. However, before you can push your baby out, a few things need to happen. The opening of your uterus (cervix) has to soften, thin out, and open up (dilate) all the way to 10 cm. Also, your baby has to move down from the uterus into your vagina.  SIGNS OF LABOR  Your health care provider will first need to make sure you are in labor. Signs of labor include:   Passing what is called the mucous plug before labor begins. This is a small amount of blood-stained mucus.   Having regular, painful uterine contractions.   The time between contractions gets shorter.   The discomfort and pain gradually get more intense.  Contraction pains get worse when walking and do not go away when resting.   Your cervix becomes thinner (effacement) and dilates. BEFORE THE DELIVERY Once you are in labor and admitted into the hospital or care center, your health care provider may do the following:   Perform a complete physical exam.  Review any complications related to pregnancy or labor.  Check your blood pressure, pulse, temperature, and heart rate (vital signs).   Determine if, and when, the rupture of amniotic membranes occurred.  Do a vaginal exam (using a sterile glove and lubricant) to determine:   The position (presentation) of the baby. Is the baby's head presenting first (vertex) in the birth canal (vagina), or are the feet or buttocks first (breech)?   The level (station) of the baby's head within the birth canal.   The effacement and dilatation of the cervix.   An electronic fetal monitor is usually placed on your abdomen when you first arrive. This is used to monitor your contractions and the baby's heart rate.  When the monitor is on your abdomen (external fetal monitor), it can only pick up the frequency and  length of your contractions. It cannot tell the strength of your contractions.  If it becomes necessary for your health care provider to know exactly how strong your contractions are or to see exactly what the baby's heart rate is doing, an internal monitor may be inserted into your vagina and uterus. Your health care provider will discuss the benefits and risks of using an internal monitor and obtain your permission before inserting the device.  Continuous fetal monitoring may be needed if you have an epidural, are receiving certain medicines (such as oxytocin), or have pregnancy or labor complications.  An IV access tube may be placed into a vein in your arm to deliver fluids and medicines if necessary. THREE STAGES OF LABOR AND DELIVERY Normal labor and delivery is divided into three stages. First Stage This stage starts when you begin to contract regularly and your cervix begins to efface and dilate. It ends when your cervix is completely open (fully dilated). The first stage is the longest stage of labor and can last from 3 hours to 15 hours.  Several methods are available to help with labor pain. You and your health care provider will decide which option is best for you. Options include:   Opioid medicines. These are strong pain medicines that you can get through your IV tube or as a shot into your muscle. These medicines lessen pain but do not make it go away completely.  Epidural. A medicine is given through a thin tube   that is inserted in your back. The medicine numbs the lower part of your body and prevents any pain in that area.  Paracervical pain medicine. This is an injection of an anesthetic on each side of your cervix.   You may request natural childbirth, which does not involve the use of pain medicines or an epidural during labor and delivery. Instead, you will use other things, such as breathing exercises, to help cope with the pain. Second Stage The second stage of labor  begins when your cervix is fully dilated at 10 cm. It continues until you push your baby down through the birth canal and the baby is born. This stage can take only minutes or several hours.  The location of your baby's head as it moves through the birth canal is reported as a number called a station. If the baby's head has not started its descent, the station is described as being at minus 3 ( 3). When your baby's head is at the zero station, it is at the middle of the birth canal and is engaged in the pelvis. The station of your baby helps indicate the progress of the second stage of labor.  When your baby is born, your health care provider may hold the baby with his or her head lowered to prevent amniotic fluid, mucus, and blood from getting into the baby's lungs. The baby's mouth and nose may be suctioned with a small bulb syringe to remove any additional fluid.  Your health care provider may then place the baby on your stomach. It is important to keep the baby from getting cold. To do this, the health care provider will dry the baby off, place the baby directly on your skin (with no blankets between you and the baby), and cover the baby with warm, dry blankets.   The umbilical cord is cut. Third Stage During the third stage of labor, your health care provider will deliver the placenta (afterbirth) and make sure your bleeding is under control. The delivery of the placenta usually takes about 5 minutes but can take up to 30 minutes. After the placenta is delivered, a medicine may be given either by IV or injection to help contract the uterus and control bleeding. If you are planning to breastfeed, you can try to do so now. After you deliver the placenta, your uterus should contract and get very firm. If your uterus does not remain firm, your health care provider will massage it. This is important because the contraction of the uterus helps cut off bleeding at the site where the placenta was attached  to your uterus. If your uterus does not contract properly and stay firm, you may continue to bleed heavily. If there is a lot of bleeding, medicines may be given to contract the uterus and stop the bleeding.  Document Released: 01/21/2008 Document Revised: 12/14/2012 Document Reviewed: 10/02/2012 Exit[employer] Patient Information 2014 [employer].  

## 2013-04-27 NOTE — Progress Notes (Signed)
Dr Harolyn Rutherford stated patient to have follow up appt in clinic tues or weds. To remove wound vac.

## 2013-05-01 NOTE — Op Note (Signed)
I was present for the entire length of the procedure and agree with above operative note

## 2013-05-03 ENCOUNTER — Encounter: Payer: Self-pay | Admitting: Family Medicine

## 2013-05-03 ENCOUNTER — Ambulatory Visit (INDEPENDENT_AMBULATORY_CARE_PROVIDER_SITE_OTHER): Payer: Medicaid Other | Admitting: Family Medicine

## 2013-05-03 VITALS — BP 134/85 | HR 72 | Temp 97.8°F | Ht 67.0 in | Wt 227.9 lb

## 2013-05-03 DIAGNOSIS — Z09 Encounter for follow-up examination after completed treatment for conditions other than malignant neoplasm: Secondary | ICD-10-CM

## 2013-05-03 DIAGNOSIS — Z5189 Encounter for other specified aftercare: Secondary | ICD-10-CM

## 2013-05-03 NOTE — Progress Notes (Signed)
S   No f/c, minimal vaginal bleeding, no complaints. Minimal abd pain  O VSS Minimal TTP of uterus. Firm Wound vac removed, excellent healing. No dehisence., no erythema or other concerning findings.  A/p [name] is a 23 y.o. G3P3003 1 wk pp, f/u in 4wks as previously scheduled. Bottle feeding, nexplanon at that time.  [name], MD OB Fellow

## 2013-05-29 ENCOUNTER — Encounter (HOSPITAL_COMMUNITY): Payer: Self-pay | Admitting: Emergency Medicine

## 2013-05-29 ENCOUNTER — Emergency Department (HOSPITAL_COMMUNITY): Payer: Medicaid Other

## 2013-05-29 ENCOUNTER — Emergency Department (HOSPITAL_COMMUNITY)
Admission: EM | Admit: 2013-05-29 | Discharge: 2013-05-29 | Disposition: A | Discharge status: Home / Self Care | Payer: Medicaid Other | Attending: Emergency Medicine | Admitting: Emergency Medicine

## 2013-05-29 DIAGNOSIS — F09 Unspecified mental disorder due to known physiological condition: Secondary | ICD-10-CM | POA: Insufficient documentation

## 2013-05-29 DIAGNOSIS — Z87891 Personal history of nicotine dependence: Secondary | ICD-10-CM | POA: Insufficient documentation

## 2013-05-29 DIAGNOSIS — R599 Enlarged lymph nodes, unspecified: Secondary | ICD-10-CM | POA: Insufficient documentation

## 2013-05-29 DIAGNOSIS — R591 Generalized enlarged lymph nodes: Secondary | ICD-10-CM

## 2013-05-29 DIAGNOSIS — F319 Bipolar disorder, unspecified: Secondary | ICD-10-CM | POA: Insufficient documentation

## 2013-05-29 DIAGNOSIS — J3489 Other specified disorders of nose and nasal sinuses: Secondary | ICD-10-CM | POA: Insufficient documentation

## 2013-05-29 DIAGNOSIS — Z880 Allergy status to penicillin: Secondary | ICD-10-CM | POA: Insufficient documentation

## 2013-05-29 DIAGNOSIS — F411 Generalized anxiety disorder: Secondary | ICD-10-CM | POA: Insufficient documentation

## 2013-05-29 DIAGNOSIS — Z79899 Other long term (current) drug therapy: Secondary | ICD-10-CM | POA: Insufficient documentation

## 2013-05-29 DIAGNOSIS — E049 Nontoxic goiter, unspecified: Secondary | ICD-10-CM | POA: Insufficient documentation

## 2013-05-29 LAB — POCT I-STAT, CHEM 8
BUN: 14 mg/dL (ref 6–23)
Calcium, Ion: 1.26 mmol/L — ABNORMAL HIGH (ref 1.12–1.23)
Chloride: 106 mEq/L (ref 96–112)
Creatinine, Ser: 0.8 mg/dL (ref 0.50–1.10)
Glucose, Bld: 95 mg/dL (ref 70–99)
HCT: 35 % — ABNORMAL LOW (ref 36.0–46.0)
HEMOGLOBIN: 11.9 g/dL — AB (ref 12.0–15.0)
Potassium: 3.8 mEq/L (ref 3.7–5.3)
SODIUM: 144 meq/L (ref 137–147)
TCO2: 24 mmol/L (ref 0–100)

## 2013-05-29 MED ORDER — IOHEXOL 300 MG/ML  SOLN
75.0000 mL | Freq: Once | INTRAMUSCULAR | Status: AC | PRN
  Administered 2013-05-29: 75 mL via INTRAVENOUS

## 2013-05-29 NOTE — ED Provider Notes (Signed)
CSN: [id number]     Arrival date & time 05/29/13  1916 History  This chart was scribed for non-physician practitioner Margarita Mail, PA-C, working with Leota Jacobsen, MD by Vernell Barrier, ED scribe. This patient was seen in room TR05C/TR05C and the patient's care was started at 9:03 PM.    Chief Complaint  Patient presents with  . Mass   The history is provided by the patient. No language interpreter was used.   HPI Comments: [name] is a 23 y.o. female who presents to the Emergency Department complaining of a mass in her neck, onset 5 days ago; tender with touch. Reports no change in voice as a result of mass. Pt is unsure if she has ever had thyroid trouble in the past. Pt has had tonsils removed. Denies fever, chills, sore throat, trouble swallowing, diaphoresis, or unexpected weight change.   Past Medical History  Diagnosis Date  . Anxiety   . Mental disorder   . Depression   . Cognitive deficits   . Bipolar 1 disorder    Past Surgical History  Procedure Laterality Date  . Cesarean section    . Tonsillectomy N/A 06/03/2012    Procedure: TONSILLECTOMY;  Surgeon: [name], MD;  Location: [place];  Service: ENT;  Laterality: N/A;  . Mass excision N/A 06/03/2012    Procedure: EXCISION MASS;  Surgeon: [name], MD;  Location: [place];  Service: ENT;  Laterality: N/A;  Excision uvula mass  . Cesarean section N/A 04/25/2013    Procedure: REPEAT CESAREAN SECTION;  Surgeon: [name], MD;  Location: [facility];  Service: Obstetrics;  Laterality: N/A;   Family History  Problem Relation Age of Onset  . Hypertension Mother   . Diabetes Father    History  Substance Use Topics  . Smoking status: Former Smoker -- 7 years    Quit date: 09/30/2011  . Smokeless tobacco: Never Used  . Alcohol Use: No   OB History   Grav Para Term Preterm Abortions TAB SAB Ect Mult Living   3 3 3  0 0 0 0 0 0 3     Review of Systems   Constitutional: Negative for fever, chills, diaphoresis and unexpected weight change.  HENT: Negative for sore throat and trouble swallowing.   Respiratory: Negative for cough and shortness of breath.   Neurological: Negative for speech difficulty and numbness.    Allergies  Penicillins  Home Medications   Current Outpatient Rx  Name  Route  Sig  Dispense  Refill  . busPIRone (BUSPAR) 10 MG tablet   Oral   Take 10 mg by mouth daily.         . sertraline (ZOLOFT) 25 MG tablet   Oral   Take 25 mg by mouth daily.          Triage Vitals: BP 142/95  Pulse 78  Temp(Src) 97.8 F (36.6 C) (Oral)  Resp 20  SpO2 100% Physical Exam  Nursing note and vitals reviewed. Constitutional: She is oriented to person, place, and time. She appears well-developed and well-nourished. No distress.  HENT:  Head: Normocephalic and atraumatic.  Eyes: EOM are normal.  Neck: Neck supple. Thyromegaly present.  Cardiovascular: Normal rate.   Pulmonary/Chest: Effort normal. No respiratory distress.  Musculoskeletal: Normal range of motion.  Lymphadenopathy:    She has no cervical adenopathy.  Neurological: She is alert and oriented to person, place, and time.  Skin: Skin is warm and dry.  Psychiatric:  She has a normal mood and affect. Her behavior is normal.    ED Course  Procedures (including critical care time) DIAGNOSTIC STUDIES: Oxygen Saturation is 100% on room air, normal by my interpretation.    COORDINATION OF CARE: At 9:05 PM: Discussed treatment plan with patient. Patient agrees.   Labs Review Labs Reviewed - No data to display Imaging Review No results found.  EKG Interpretation   None       MDM   1. Lymphadenopathy    Patient exam concerning for possible thyromegaly.  CT soft tissue scan of the neck shows no thyromegaly or thyroid nodules.  She does have reactive lymphadenopathy.  Feel this is secondary to viral infection.  Patient does have some congestion  although she denies any sore throat and there is no evidence of tonsillar swelling.  Patient has a previous history of uvulectomy for a uvular mass performed in February of 2014, however do not feel that her lymphadenopathy is related to previous Mass excision.  Have the patient follow up closely with her primary care physician if her lymphadenopathy does not resolve within the next week. Have reviewed findings with the patient.  She appears to understand and appears reliable for followup.  She suppresses understanding of plan and agrees. I personally performed the services described in this documentation, which was scribed in my presence. The recorded information has been reviewed and is accurate.       Margarita Mail, PA-C 05/31/13 1108

## 2013-05-29 NOTE — Discharge Instructions (Signed)
Your ct scan shows some lymph nodes that are swollen. This is likely due to a viral infection. If it continues  For a 14 days. Please see the ear nose and throat doctor.    Cervical Adenitis You have a swollen lymph gland in your neck. This commonly happens with Strep and virus infections, dental problems, insect bites, and injuries about the face, scalp, or neck. The lymph glands swell as the body fights the infection or heals the injury. Swelling and firmness typically lasts for several weeks after the infection or injury is healed. Rarely lymph glands can become swollen because of cancer or TB. Antibiotics are prescribed if there is evidence of an infection. Sometimes an infected lymph gland becomes filled with pus. This condition may require opening up the abscessed gland by draining it surgically. Most of the time infected glands return to normal within two weeks. Do not poke or squeeze the swollen lymph nodes. That may keep them from shrinking back to their normal size. If the lymph gland is still swollen after 2 weeks, further medical evaluation is needed.  SEEK IMMEDIATE MEDICAL CARE IF:  You have difficulty swallowing or breathing, increased swelling, severe pain, or a high fever.  Document Released: 04/13/2005 Document Revised: 07/06/2011 Document Reviewed: 10/03/2006 [facility] Patient Information 2014 [place].

## 2013-05-29 NOTE — ED Notes (Signed)
Pt. reports left neck lump onset 5 days ago , denies pain or injury , respirations unlabored , denies fever or chills. No dysphagia. Airway intact .

## 2013-06-03 ENCOUNTER — Emergency Department (HOSPITAL_COMMUNITY)
Admission: EM | Admit: 2013-06-03 | Discharge: 2013-06-05 | Disposition: A | Discharge status: Home / Self Care | Payer: Medicaid Other | Attending: Emergency Medicine | Admitting: Emergency Medicine

## 2013-06-03 ENCOUNTER — Encounter (HOSPITAL_COMMUNITY): Payer: Self-pay | Admitting: Emergency Medicine

## 2013-06-03 DIAGNOSIS — R45851 Suicidal ideations: Secondary | ICD-10-CM | POA: Insufficient documentation

## 2013-06-03 DIAGNOSIS — F911 Conduct disorder, childhood-onset type: Secondary | ICD-10-CM | POA: Insufficient documentation

## 2013-06-03 DIAGNOSIS — Z3202 Encounter for pregnancy test, result negative: Secondary | ICD-10-CM | POA: Insufficient documentation

## 2013-06-03 DIAGNOSIS — F411 Generalized anxiety disorder: Secondary | ICD-10-CM | POA: Insufficient documentation

## 2013-06-03 DIAGNOSIS — F32A Depression, unspecified: Secondary | ICD-10-CM

## 2013-06-03 DIAGNOSIS — R4189 Other symptoms and signs involving cognitive functions and awareness: Secondary | ICD-10-CM

## 2013-06-03 DIAGNOSIS — Z79899 Other long term (current) drug therapy: Secondary | ICD-10-CM | POA: Insufficient documentation

## 2013-06-03 DIAGNOSIS — Z880 Allergy status to penicillin: Secondary | ICD-10-CM | POA: Insufficient documentation

## 2013-06-03 DIAGNOSIS — F3130 Bipolar disorder, current episode depressed, mild or moderate severity, unspecified: Secondary | ICD-10-CM | POA: Insufficient documentation

## 2013-06-03 DIAGNOSIS — Z87891 Personal history of nicotine dependence: Secondary | ICD-10-CM | POA: Insufficient documentation

## 2013-06-03 DIAGNOSIS — F329 Major depressive disorder, single episode, unspecified: Secondary | ICD-10-CM

## 2013-06-03 MED ORDER — ALUM & MAG HYDROXIDE-SIMETH 200-200-20 MG/5ML PO SUSP: 30.0000 mL | ORAL | Status: DC | PRN

## 2013-06-03 MED ORDER — ONDANSETRON HCL 4 MG PO TABS: 4.0000 mg | ORAL_TABLET | Freq: Three times a day (TID) | ORAL | Status: DC | PRN

## 2013-06-03 MED ORDER — ACETAMINOPHEN 325 MG PO TABS: 650.0000 mg | ORAL_TABLET | ORAL | Status: DC | PRN

## 2013-06-03 MED ORDER — LORAZEPAM 1 MG PO TABS: 1.0000 mg | ORAL_TABLET | Freq: Three times a day (TID) | ORAL | Status: DC | PRN

## 2013-06-03 MED ORDER — IBUPROFEN 200 MG PO TABS: 600.0000 mg | ORAL_TABLET | Freq: Three times a day (TID) | ORAL | Status: DC | PRN

## 2013-06-03 MED ORDER — NICOTINE 21 MG/24HR TD PT24: 21.0000 mg | MEDICATED_PATCH | Freq: Every day | TRANSDERMAL | Status: DC

## 2013-06-03 MED ORDER — ZOLPIDEM TARTRATE 5 MG PO TABS: 5.0000 mg | ORAL_TABLET | Freq: Every evening | ORAL | Status: DC | PRN

## 2013-06-03 NOTE — ED Provider Notes (Signed)
Medical screening examination/treatment/procedure(s) were performed by non-physician practitioner and as supervising physician I was immediately available for consultation/collaboration.  EKG Interpretation   None        Leota Jacobsen, MD 06/03/13 [phone]

## 2013-06-03 NOTE — ED Notes (Signed)
Pt arrived via [employer] with a need for medical clearance. Pt states she has had a bad week. [employer] has taken her newborn baby and she is upset about it.  Pt states she is also having problems with her boyfriend.  Pt is on depression medication but states it is not working,.  Pt verbalizes suicidal intent with a plan to use her pills to overdose.  Pt states that she knows this is not correct so she came here.  Pt is open to the medical clearance and behavioral health assessment procedure.

## 2013-06-03 NOTE — ED Provider Notes (Signed)
CSN: [id number]     Arrival date & time 06/03/13  2253 History  This chart was scribed for non-physician practitioner, Antonietta Breach, PA-C,working with Teressa Lower, MD, by Marlowe Kays, ED Scribe.  This patient was seen in room WTR4/WLPT4 and the patient's care was started at 11:47 PM.  Chief Complaint  Patient presents with  . Medical Clearance   The history is provided by the patient. No language interpreter was used.   HPI Comments:  [name] is a 23 y.o. female, brought in by [employer] who presents to the Emergency Department complaining of worsening anger and depression for the past month. Pt states she just had a baby approximately one month ago and she was taken by [employer]. Pt reports that her other two children were taken from her and put up for adoption. She states her boyfriend and she have been arguing a lot lately and states she cannot take it any longer. She states she presented to [facility] to receive a "shot" for her depression earlier today, but was told to come back Monday. She states she has experienced suicidal ideations and had planned to take pills. She states she has seen her psychiatrist and [facility] four days ago and states she did not inform him of her SI. She denies having access to firearms. She denies homicidal ideations.   Past Medical History  Diagnosis Date  . Anxiety   . Mental disorder   . Depression   . Cognitive deficits   . Bipolar 1 disorder    Past Surgical History  Procedure Laterality Date  . Cesarean section    . Tonsillectomy N/A 06/03/2012    Procedure: TONSILLECTOMY;  Surgeon: [name], MD;  Location: [place];  Service: ENT;  Laterality: N/A;  . Mass excision N/A 06/03/2012    Procedure: EXCISION MASS;  Surgeon: [name], MD;  Location: [place];  Service: ENT;  Laterality: N/A;  Excision uvula mass  . Cesarean section N/A 04/25/2013    Procedure: REPEAT CESAREAN SECTION;  Surgeon: [name], MD;   Location: [facility];  Service: Obstetrics;  Laterality: N/A;   Family History  Problem Relation Age of Onset  . Hypertension Mother   . Diabetes Father    History  Substance Use Topics  . Smoking status: Former Smoker -- 7 years    Quit date: 09/30/2011  . Smokeless tobacco: Never Used  . Alcohol Use: No     Comment: Patient denies    OB History   Grav Para Term Preterm Abortions TAB SAB Ect Mult Living   3 3 3  0 0 0 0 0 0 3     Review of Systems  Psychiatric/Behavioral: Positive for suicidal ideas and agitation.  All other systems reviewed and are negative.    Allergies  Penicillins  Home Medications   Current Outpatient Rx  Name  Route  Sig  Dispense  Refill  . Multiple Vitamin (MULTIVITAMIN WITH MINERALS) TABS tablet   Oral   Take 1 tablet by mouth daily.         . sertraline (ZOLOFT) 25 MG tablet   Oral   Take 25 mg by mouth daily.          Triage Vitals: BP 130/89  Pulse 72  Temp(Src) 98.8 F (37.1 C) (Oral)  Resp 16  SpO2 100%  Physical Exam  Nursing note and vitals reviewed. Constitutional: She is oriented to person, place, and time. She appears well-developed and well-nourished. No  distress.  HENT:  Head: Normocephalic and atraumatic.  Eyes: Conjunctivae and EOM are normal. No scleral icterus.  Neck: Normal range of motion.  Cardiovascular: Normal rate, regular rhythm and normal heart sounds.   Pulmonary/Chest: Effort normal and breath sounds normal. No respiratory distress. She has no wheezes. She has no rales.  Abdominal: Soft. There is no tenderness. There is no rebound and no guarding.  Musculoskeletal: Normal range of motion.  Neurological: She is alert and oriented to person, place, and time.  Skin: Skin is warm and dry. No rash noted. She is not diaphoretic. No erythema. No pallor.  Psychiatric: Her speech is normal. She is withdrawn. Cognition and memory are normal. She exhibits a depressed mood. She expresses suicidal ideation. She  expresses no homicidal ideation. She expresses suicidal plans. She expresses no homicidal plans.    ED Course  Procedures (including critical care time) DIAGNOSTIC STUDIES: Oxygen Saturation is 100% on RA, normal by my interpretation.   COORDINATION OF CARE: 11:51 PM- Will order standard medical clearance labs. Pt verbalizes understanding and agrees to plan.  Medications  LORazepam (ATIVAN) tablet 1 mg (not administered)  acetaminophen (TYLENOL) tablet 650 mg (not administered)  ibuprofen (ADVIL,MOTRIN) tablet 600 mg (not administered)  zolpidem (AMBIEN) tablet 5 mg (not administered)  nicotine (NICODERM CQ - dosed in mg/24 hours) patch 21 mg (not administered)  alum & mag hydroxide-simeth (MAALOX/MYLANTA) 200-200-20 MG/5ML suspension 30 mL (not administered)  ondansetron (ZOFRAN) tablet 4 mg (not administered)    Labs Review Labs Reviewed  CBC - Abnormal; Notable for the following:    Hemoglobin 9.9 (*)    HCT 31.5 (*)    MCV 75.0 (*)    MCH 23.6 (*)    RDW 16.3 (*)    All other components within normal limits  COMPREHENSIVE METABOLIC PANEL - Abnormal; Notable for the following:    Potassium 3.5 (*)    Total Bilirubin 0.2 (*)    All other components within normal limits  SALICYLATE LEVEL - Abnormal; Notable for the following:    Salicylate Lvl <1.9 (*)    All other components within normal limits  ACETAMINOPHEN LEVEL  ETHANOL  URINE RAPID DRUG SCREEN (HOSP PERFORMED)  POCT PREGNANCY, URINE   Imaging Review No results found.  EKG Interpretation   None       MDM   1. Depression   2. Suicidal ideations    23 year old female with a history of depression presents for worsening depression and suicidal ideation. Patient denies homicidal ideations, illicit drug use, and alcohol use. She has been medically cleared for TTS evaluation. She is currently pending psychiatric evaluation in AM. Disposition to be determined by oncoming ED provider.  I personally performed  the services described in this documentation, which was scribed in my presence. The recorded information has been reviewed and is accurate.    Antonietta Breach, PA-C 06/04/13 [phone]

## 2013-06-04 ENCOUNTER — Encounter (HOSPITAL_COMMUNITY): Payer: Self-pay | Admitting: *Deleted

## 2013-06-04 DIAGNOSIS — R45851 Suicidal ideations: Secondary | ICD-10-CM

## 2013-06-04 DIAGNOSIS — F329 Major depressive disorder, single episode, unspecified: Secondary | ICD-10-CM

## 2013-06-04 DIAGNOSIS — F3130 Bipolar disorder, current episode depressed, mild or moderate severity, unspecified: Secondary | ICD-10-CM

## 2013-06-04 DIAGNOSIS — F3289 Other specified depressive episodes: Secondary | ICD-10-CM

## 2013-06-04 LAB — COMPREHENSIVE METABOLIC PANEL
ALBUMIN: 3.5 g/dL (ref 3.5–5.2)
ALT: 17 U/L (ref 0–35)
AST: 24 U/L (ref 0–37)
Alkaline Phosphatase: 49 U/L (ref 39–117)
BUN: 10 mg/dL (ref 6–23)
CHLORIDE: 105 meq/L (ref 96–112)
CO2: 24 mEq/L (ref 19–32)
CREATININE: 0.69 mg/dL (ref 0.50–1.10)
Calcium: 8.7 mg/dL (ref 8.4–10.5)
GFR calc Af Amer: >90 mL/min (ref >90)
GFR calc non Af Amer: >90 mL/min (ref >90)
Glucose, Bld: 96 mg/dL (ref 70–99)
POTASSIUM: 3.5 meq/L — AB (ref 3.7–5.3)
Sodium: 142 mEq/L (ref 137–147)
TOTAL PROTEIN: 7.2 g/dL (ref 6.0–8.3)
Total Bilirubin: 0.2 mg/dL — ABNORMAL LOW (ref 0.3–1.2)

## 2013-06-04 LAB — CBC
HCT: 31.5 % — ABNORMAL LOW (ref 36.0–46.0)
Hemoglobin: 9.9 g/dL — ABNORMAL LOW (ref 12.0–15.0)
MCH: 23.6 pg — ABNORMAL LOW (ref 26.0–34.0)
MCHC: 31.4 g/dL (ref 30.0–36.0)
MCV: 75 fL — AB (ref 78.0–100.0)
PLATELETS: 236 10*3/uL (ref 150–400)
RBC: 4.2 MIL/uL (ref 3.87–5.11)
RDW: 16.3 % — AB (ref 11.5–15.5)
WBC: 9.7 10*3/uL (ref 4.0–10.5)

## 2013-06-04 LAB — SALICYLATE LEVEL

## 2013-06-04 LAB — RAPID URINE DRUG SCREEN, HOSP PERFORMED
AMPHETAMINES: NOT DETECTED
BENZODIAZEPINES: NOT DETECTED
Barbiturates: NOT DETECTED
COCAINE: NOT DETECTED
OPIATES: NOT DETECTED
Tetrahydrocannabinol: NOT DETECTED

## 2013-06-04 LAB — ACETAMINOPHEN LEVEL

## 2013-06-04 LAB — POCT PREGNANCY, URINE: PREG TEST UR: NEGATIVE

## 2013-06-04 LAB — ETHANOL

## 2013-06-04 MED ORDER — FLUOXETINE HCL 20 MG PO CAPS
20.0000 mg | ORAL_CAPSULE | Freq: Every day | ORAL | Status: DC
  Administered 2013-06-04 – 2013-06-05 (×2): 20 mg via ORAL
  Filled 2013-06-04 (×2): qty 1

## 2013-06-04 MED ORDER — ADULT MULTIVITAMIN W/MINERALS CH
1.0000 | ORAL_TABLET | Freq: Every day | ORAL | Status: DC
  Administered 2013-06-04 – 2013-06-05 (×2): 1 via ORAL
  Filled 2013-06-04 (×2): qty 1

## 2013-06-04 MED ORDER — PALIPERIDONE ER 3 MG PO TB24
3.0000 mg | ORAL_TABLET | Freq: Every day | ORAL | Status: DC
  Administered 2013-06-04 – 2013-06-05 (×2): 3 mg via ORAL
  Filled 2013-06-04 (×3): qty 1

## 2013-06-04 NOTE — BH Assessment (Signed)
Tele Assessment Note   [name] is an 23 y.o. female who presented to [facility] Emergency Department with the chief complaint of suicidal ideations with plan to overdose on prescription medications. Patient reported that DSS removed her newborn from the home one week ago in addition to previously removing her other two children from the home prior to that. Patient stated that DSS became involved two years ago when she attempted to give up her youngest child at the time at a fire station. Patient reported exacerbated depressive symptoms that include feelings of guilt, hopelessness and helplessness, & heightened irritability. Patient reported she initially attempted to "get a shot" at [facility] prior to calling the police however patient stated [name] allegedly informed her that she would have to wait until Monday for any medication. Patient reported she is currently seeing a psychiatrist at [place] but is unable to identify her psychiatrist by name at this time. Patient stated that she is currently taking Zoloft 50mg , which was increased 4 days ago from previously taking a dosage of 25mg . Patient reports a past history of inpatient treatment at [facility] and [facility]. Patient continues to endorse SI with active plan and is unable to contract for safety at this time.   Axis I: Major Depression, Recurrent severe Axis II: Deferred Axis III:  Past Medical History  Diagnosis Date  . Anxiety   . Mental disorder   . Depression   . Cognitive deficits   . Bipolar 1 disorder    Axis IV: other psychosocial or environmental problems, problems related to social environment and problems with primary support group Axis V: 11-20 some danger of hurting self or others possible OR occasionally fails to maintain minimal personal hygiene OR gross impairment in communication  Past Medical History:  Past Medical History  Diagnosis Date  . Anxiety   . Mental disorder   . Depression   .  Cognitive deficits   . Bipolar 1 disorder     Past Surgical History  Procedure Laterality Date  . Cesarean section    . Tonsillectomy N/A 06/03/2012    Procedure: TONSILLECTOMY;  Surgeon: [name], MD;  Location: [place];  Service: ENT;  Laterality: N/A;  . Mass excision N/A 06/03/2012    Procedure: EXCISION MASS;  Surgeon: [name], MD;  Location: [place];  Service: ENT;  Laterality: N/A;  Excision uvula mass  . Cesarean section N/A 04/25/2013    Procedure: REPEAT CESAREAN SECTION;  Surgeon: [name], MD;  Location: [facility];  Service: Obstetrics;  Laterality: N/A;    Family History:  Family History  Problem Relation Age of Onset  . Hypertension Mother   . Diabetes Father     Social History:  reports that she quit smoking about 20 months ago. She has never used smokeless tobacco. She reports that she does not drink alcohol or use illicit drugs.  Additional Social History:  Alcohol / Drug Use History of alcohol / drug use?: No history of alcohol / drug abuse  CIWA: CIWA-Ar BP: 118/72 mmHg Pulse Rate: 69 COWS:    Allergies:  Allergies  Allergen Reactions  . Penicillins Hives    Face breaks out.    Home Medications:  (Not in a hospital admission)  OB/GYN Status:  No LMP recorded.  General Assessment Data Location of Assessment: [facility] Services Is this a Tele or Face-to-Face Assessment?: Tele Assessment Is this an Initial Assessment or a Re-assessment for this encounter?: Initial  Assessment Living Arrangements: Alone Can pt return to current living arrangement?: Yes Admission Status: Voluntary Is patient capable of signing voluntary admission?: Yes Transfer from: [facility] Referral Source: Self/Family/Friend     [place] Living Arrangements: Alone Name of Psychiatrist: "[facility]" per patient   Education Status Is patient currently in school?: No  Risk to self Suicidal  Ideation: Yes-Currently Present Suicidal Intent: Yes-Currently Present Is patient at risk for suicide?: Yes Suicidal Plan?: Yes-Currently Present Specify Current Suicidal Plan: To overdose on pills Access to Means: Yes Specify Access to Suicidal Means: Access to pills  What has been your use of drugs/alcohol within the last 12 months?: Patient denies Previous Attempts/Gestures: Yes How many times?: 3 Other Self Harm Risks: None Triggers for Past Attempts: Other personal contacts Intentional Self Injurious Behavior: None Family Suicide History: Unknown Recent stressful life event(s): Loss (Comment) ([employer]SS removed newborn and other children from home.) Persecutory voices/beliefs?: No Depression: Yes Depression Symptoms: Isolating;Guilt;Loss of interest in usual pleasures;Feeling worthless/self pity;Feeling angry/irritable Substance abuse history and/or treatment for substance abuse?: No Suicide prevention information given to non-admitted patients: Not applicable  Risk to Others Homicidal Ideation: No Thoughts of Harm to Others: No Current Homicidal Intent: No Current Homicidal Plan: No Access to Homicidal Means: No Identified Victim: None History of harm to others?: No Assessment of Violence: None Noted Violent Behavior Description: Patient is calm and cooperative Does patient have access to weapons?: No Criminal Charges Pending?: No Does patient have a court date: No  Psychosis Hallucinations: None noted Delusions: None noted  Mental Status Report Appear/Hygiene: Disheveled Eye Contact: Fair Motor Activity: Freedom of movement Speech: Logical/coherent Level of Consciousness: Alert;Quiet/awake Mood: Depressed Affect: Depressed Anxiety Level: Minimal Thought Processes: Coherent;Relevant Judgement: Impaired Orientation: Person;Place;Time;Situation Obsessive Compulsive Thoughts/Behaviors: None  Cognitive Functioning Concentration: Decreased Memory: Recent  Intact;Remote Intact IQ: Average Insight: Poor Impulse Control: Poor Appetite: Fair Weight Loss: 0 Weight Gain: 0 Sleep: No Change Total Hours of Sleep: 7 Vegetative Symptoms: None  ADLScreening [facility] Assessment Services) Patient's cognitive ability adequate to safely complete daily activities?: Yes Patient able to express need for assistance with ADLs?: No Independently performs ADLs?: Yes (appropriate for developmental age)  Prior Inpatient Therapy Prior Inpatient Therapy: Yes Prior Therapy Dates: 2014 Prior Therapy Facilty/Provider(s): [facility] Reason for Treatment: Depression  Prior Outpatient Therapy Prior Outpatient Therapy: Yes Prior Therapy Dates: Current Prior Therapy Facilty/Provider(s): [facility] Reason for Treatment: Med Management  ADL Screening (condition at time of admission) Patient's cognitive ability adequate to safely complete daily activities?: Yes Is the patient deaf or have difficulty hearing?: No Does the patient have difficulty seeing, even when wearing glasses/contacts?: No Does the patient have difficulty concentrating, remembering, or making decisions?: No Patient able to express need for assistance with ADLs?: No Does the patient have difficulty dressing or bathing?: No Independently performs ADLs?: Yes (appropriate for developmental age) Does the patient have difficulty walking or climbing stairs?: No Weakness of Legs: None Weakness of Arms/Hands: None  Home Assistive Devices/Equipment Home Assistive Devices/Equipment: None  Therapy Consults (therapy consults require a physician order) PT Evaluation Needed: No OT Evalulation Needed: No SLP Evaluation Needed: No Abuse/Neglect Assessment (Assessment to be complete while patient is alone) Physical Abuse: Denies Verbal Abuse: Denies Sexual Abuse: Denies Exploitation of patient/patient's resources: Denies Self-Neglect: Denies Values / Beliefs Cultural Requests During  Hospitalization: None Spiritual Requests During Hospitalization: None Consults [facility] Consult Needed: No Social Work Consult Needed: No      Additional Information 1:1  In Past 12 Months?: No CIRT Risk: No Elopement Risk: No Does patient have medical clearance?: Yes     Disposition: Consulted with [name], NP who recommends further evaluation from psychiatry tomorrow morning to determine plan of disposition and medication recommendations/adjustments.   Disposition Initial Assessment Completed for this Encounter: Yes Disposition of Patient: Referred to  Harriet Masson 06/04/2013 12:53 AM

## 2013-06-04 NOTE — Consult Note (Signed)
[facility] Consult   Reason for Consult:  Depression Referring Physician:  EDP  [name] is an 23 y.o. female. Total Time spent with patient: 45 minutes  Assessment: AXIS I:  Major Depressive Disorder, recurrent AXIS II:  Deferred AXIS III:   Past Medical History  Diagnosis Date  . Anxiety   . Mental disorder   . Depression   . Cognitive deficits   . Bipolar 1 disorder    AXIS IV:  other psychosocial or environmental problems AXIS V:  51-60 moderate symptoms  Plan:  No evidence of imminent risk to self or others at present.   Supportive therapy provided about ongoing stressors. Discussed crisis plan, support from social network, calling 911, coming to the Emergency Department, and calling Suicide Hotline. Overnight observation  Subjective:   [name] is a 23 y.o. female patient admitted with Major Depressive Disorder.  HPI:  Patient states "I am having some trouble with depression.  I have been depressed since DSS took my baby.  I have been taking Zoloft and it doesn't seem to work.  My doctor just increased it to 50 mg.  I went to [facility] before come here cause I use to get a shot that really helped but they said that I would have to wait until Monday.  I called the police cause I was having suicidal thoughts and I was afraid that I would harm my self. I think if I can get my medicine right I'll be okay.  I was taking Invega once a month.  When I was taking that my Mom said that even she could tell a difference.  When I took Prozac it helped too.   Patient states that she has outpatient services with [facility] ([facility]).   Patient denies homicidal ideation, psychosis, and paranoia  HPI Elements:   Location:  Depression and suicidal thoughts. Quality:  States medication is not helping depression. Severity:  Worsening depression. Timing:  Couple weeks.  Past Psychiatric History: Past Medical History  Diagnosis Date  . Anxiety   . Mental  disorder   . Depression   . Cognitive deficits   . Bipolar 1 disorder     reports that she quit smoking about 20 months ago. She has never used smokeless tobacco. She reports that she does not drink alcohol or use illicit drugs. Family History  Problem Relation Age of Onset  . Hypertension Mother   . Diabetes Father    Family History Substance Abuse: No Family Supports: Yes, List: Living Arrangements: Alone Can pt return to current living arrangement?: Yes Abuse/Neglect [facility]) Physical Abuse: Denies Verbal Abuse: Denies Sexual Abuse: Denies Allergies:   Allergies  Allergen Reactions  . Penicillins Hives    Face breaks out.    ACT Assessment Complete:  Yes:    Educational Status    Risk to Self: Risk to self Suicidal Ideation: Yes-Currently Present Suicidal Intent: Yes-Currently Present Is patient at risk for suicide?: Yes Suicidal Plan?: Yes-Currently Present Specify Current Suicidal Plan: To overdose on pills Access to Means: Yes Specify Access to Suicidal Means: Access to pills  What has been your use of drugs/alcohol within the last 12 months?: Patient denies Previous Attempts/Gestures: Yes How many times?: 3 Other Self Harm Risks: None Triggers for Past Attempts: Other personal contacts Intentional Self Injurious Behavior: None Family Suicide History: Unknown Recent stressful life event(s): Loss (Comment) (DSS removed newborn and other children from home.) Persecutory voices/beliefs?: No Depression: Yes Depression Symptoms: Isolating;Guilt;Loss  of interest in usual pleasures;Feeling worthless/self pity;Feeling angry/irritable Substance abuse history and/or treatment for substance abuse?: No Suicide prevention information given to non-admitted patients: Not applicable  Risk to Others: Risk to Others Homicidal Ideation: No Thoughts of Harm to Others: No Current Homicidal Intent: No Current Homicidal Plan: No Access to Homicidal Means: No Identified Victim:  None History of harm to others?: No Assessment of Violence: None Noted Violent Behavior Description: Patient is calm and cooperative Does patient have access to weapons?: No Criminal Charges Pending?: No Does patient have a court date: No  Abuse: Abuse/Neglect Assessment (Assessment to be complete while patient is alone) Physical Abuse: Denies Verbal Abuse: Denies Sexual Abuse: Denies Exploitation of patient/patient's resources: Denies Self-Neglect: Denies  Prior Inpatient Therapy: Prior Inpatient Therapy Prior Inpatient Therapy: Yes Prior Therapy Dates: 2014 Prior Therapy Facilty/Provider[facility]): [facility] Reason for Treatment: Depression  Prior Outpatient Therapy: Prior Outpatient Therapy Prior Outpatient Therapy: Yes Prior Therapy Dates: Current Prior [facility]ilty[facility] Reason for Treatment: Med Management  Additional Information: Additional Information 1:1 In Past 12 Months?: No CIRT Risk: No Elopement Risk: No Does patient have medical clearance?: Yes    Objective: Blood pressure 128/79, pulse 79, temperature 97.4 F (36.3 C), temperature source Oral, resp. rate 17, SpO2 100.00%, currently breastfeeding.There is no weight on file to calculate BMI. Results for orders placed during the hospital encounter of 06/03/13 (from the past 72 hour(s))  ACETAMINOPHEN LEVEL     Status: None   Collection Time    06/04/13 12:18 AM      Result Value Range   Acetaminophen (Tylenol), Serum <15.0  10 - 30 ug/mL   Comment:            THERAPEUTIC CONCENTRATIONS VARY     SIGNIFICANTLY. A RANGE OF 10-30     ug/mL MAY BE AN EFFECTIVE     CONCENTRATION FOR MANY PATIENTS.     HOWEVER, SOME ARE BEST TREATED     AT CONCENTRATIONS OUTSIDE THIS     RANGE.     ACETAMINOPHEN CONCENTRATIONS     >150 ug/mL AT 4 HOURS AFTER     INGESTION AND >50 ug/mL AT 12     HOURS AFTER INGESTION ARE     OFTEN ASSOCIATED WITH TOXIC     REACTIONS.  CBC     Status: Abnormal    Collection Time    06/04/13 12:18 AM      Result Value Range   WBC 9.7  4.0 - 10.5 K/uL   RBC 4.20  3.87 - 5.11 MIL/uL   Hemoglobin 9.9 (*) 12.0 - 15.0 g/dL   HCT 31.5 (*) 36.0 - 46.0 %   MCV 75.0 (*) 78.0 - 100.0 fL   MCH 23.6 (*) 26.0 - 34.0 pg   MCHC 31.4  30.0 - 36.0 g/dL   RDW 16.3 (*) 11.5 - 15.5 %   Platelets 236  150 - 400 K/uL   Comment: REPEATED TO VERIFY     SPECIMEN CHECKED FOR CLOTS  COMPREHENSIVE METABOLIC PANEL     Status: Abnormal   Collection Time    06/04/13 12:18 AM      Result Value Range   Sodium 142  137 - 147 mEq/L   Potassium 3.5 (*) 3.7 - 5.3 mEq/L   Chloride 105  96 - 112 mEq/L   CO2 24  19 - 32 mEq/L   Glucose, Bld 96  70 - 99 mg/dL   BUN 10  6 - 23 mg/dL  Creatinine, Ser 0.69  0.50 - 1.10 mg/dL   Calcium 8.7  8.4 - 10.5 mg/dL   Total Protein 7.2  6.0 - 8.3 g/dL   Albumin 3.5  3.5 - 5.2 g/dL   AST 24  0 - 37 U/L   ALT 17  0 - 35 U/L   Alkaline Phosphatase 49  39 - 117 U/L   Total Bilirubin 0.2 (*) 0.3 - 1.2 mg/dL   GFR calc non Af Amer >90  >90 mL/min   GFR calc Af Amer >90  >90 mL/min   Comment: (NOTE)     The eGFR has been calculated using the CKD EPI equation.     This calculation has not been validated in all clinical situations.     eGFR's persistently <90 mL/min signify possible Chronic Kidney     Disease.  ETHANOL     Status: None   Collection Time    06/04/13 12:18 AM      Result Value Range   Alcohol, Ethyl (B) <11  0 - 11 mg/dL   Comment:            LOWEST DETECTABLE LIMIT FOR     SERUM ALCOHOL IS 11 mg/dL     FOR MEDICAL PURPOSES ONLY  SALICYLATE LEVEL     Status: Abnormal   Collection Time    06/04/13 12:18 AM      Result Value Range   Salicylate Lvl <3.5 (*) 2.8 - 20.0 mg/dL  URINE RAPID DRUG SCREEN (HOSP PERFORMED)     Status: None   Collection Time    06/04/13 12:24 AM      Result Value Range   Opiates NONE DETECTED  NONE DETECTED   Cocaine NONE DETECTED  NONE DETECTED   Benzodiazepines NONE DETECTED  NONE DETECTED    Amphetamines NONE DETECTED  NONE DETECTED   Tetrahydrocannabinol NONE DETECTED  NONE DETECTED   Barbiturates NONE DETECTED  NONE DETECTED   Comment:            DRUG SCREEN FOR MEDICAL PURPOSES     ONLY.  IF CONFIRMATION IS NEEDED     FOR ANY PURPOSE, NOTIFY LAB     WITHIN 5 DAYS.                LOWEST DETECTABLE LIMITS     FOR URINE DRUG SCREEN     Drug Class       Cutoff (ng/mL)     Amphetamine      1000     Barbiturate      200     Benzodiazepine   573     Tricyclics       220     Opiates          300     Cocaine          300     THC              50  POCT PREGNANCY, URINE     Status: None   Collection Time    06/04/13 12:31 AM      Result Value Range   Preg Test, Ur NEGATIVE  NEGATIVE   Comment:            THE SENSITIVITY OF THIS     METHODOLOGY IS >24 mIU/mL   Labs are reviewed for ETOH, illicit drug use and other medical issues. Medication reviewed.  Will stop Zoloft.  Start Prozac 20 mg daily and Invega 3  mg Q hs. Current Facility-Administered Medications  Medication Dose Route Frequency Provider Last Rate Last Dose  . acetaminophen (TYLENOL) tablet 650 mg  650 mg Oral Q4H PRN Antonietta Breach, PA-C      . alum & mag hydroxide-simeth (MAALOX/MYLANTA) 200-200-20 MG/5ML suspension 30 mL  30 mL Oral PRN Antonietta Breach, PA-C      . ibuprofen (ADVIL,MOTRIN) tablet 600 mg  600 mg Oral Q8H PRN Antonietta Breach, PA-C      . LORazepam (ATIVAN) tablet 1 mg  1 mg Oral Q8H PRN Antonietta Breach, PA-C      . ondansetron [facility]) tablet 4 mg  4 mg Oral Q8H PRN Antonietta Breach, PA-C      . zolpidem (AMBIEN) tablet 5 mg  5 mg Oral QHS PRN Antonietta Breach, PA-C       Current Outpatient Prescriptions  Medication Sig Dispense Refill  . Multiple Vitamin (MULTIVITAMIN WITH MINERALS) TABS tablet Take 1 tablet by mouth daily.      . sertraline (ZOLOFT) 25 MG tablet Take 25 mg by mouth daily.        Psychiatric Specialty Exam:     Blood pressure 128/79, pulse 79, temperature 97.4 F (36.3 C), temperature  source Oral, resp. rate 17, SpO2 100.00%, currently breastfeeding.There is no weight on file to calculate BMI.  General Appearance: Casual  Eye Contact::  Good  Speech:  Clear and Coherent and Normal Rate  Volume:  Normal  Mood:  Depressed  Affect:  Congruent, Depressed and Flat  Thought Process:  Circumstantial and Goal Directed  Orientation:  Full (Time, Place, and Person)  Thought Content:  "I need my medicine adjusted so it will work"  Suicidal Thoughts:  Yes.  without intent/plan  Homicidal Thoughts:  No  Memory:  Immediate;   Good Recent;   Good  Judgement:  Fair  Insight:  Fair  Psychomotor Activity:  Normal  Concentration:  Fair  Recall:  Good  Fund of Knowledge:Good  Language: Primary English  Akathisia:  No  Handed:  Right  AIMS (if indicated):     Assets:  Communication Skills Desire for Improvement  Sleep:      Musculoskeletal: Strength & Muscle Tone: within normal limits Gait & Station: normal Patient leans: N/A  Treatment Plan Summary: Daily contact with patient to assess and evaluate symptoms and progress in treatment Medication management Over night observation  Disposition:  Observe patient over night.  Invega injection can be given tomorrow.  If patient is feeling better give Invega injection and discharge.  Patient can follow up with [facility].  Call [facility] to set up an appointment for Nurse Tammy to follow up.   [name] FNP-BC 06/04/2013 1:24 PM  I have personally seen the patient and agreed with the findings and involved in the treatment plan. Berniece Andreas, MD

## 2013-06-04 NOTE — ED Notes (Signed)
Pt has valuables locked up in security.

## 2013-06-04 NOTE — ED Provider Notes (Signed)
Medical screening examination/treatment/procedure(s) were performed by non-physician practitioner and as supervising physician I was immediately available for consultation/collaboration.    Teressa Lower, MD 06/04/13 [phone]

## 2013-06-04 NOTE — ED Notes (Signed)
Disposition: Consulted with [name], NP who recommends further evaluation from psychiatry tomorrow morning to determine plan of disposition and medication recommendations/adjustments.   [name]. MSW, [facility]-Triage Specialist   Phone: [phone] Fax: [phone]

## 2013-06-05 ENCOUNTER — Encounter: Payer: Self-pay | Admitting: Family Medicine

## 2013-06-05 ENCOUNTER — Ambulatory Visit: Payer: Self-pay | Admitting: Family Medicine

## 2013-06-05 ENCOUNTER — Telehealth: Payer: Self-pay

## 2013-06-05 DIAGNOSIS — F411 Generalized anxiety disorder: Secondary | ICD-10-CM

## 2013-06-05 DIAGNOSIS — F09 Unspecified mental disorder due to known physiological condition: Secondary | ICD-10-CM

## 2013-06-05 MED ORDER — PALIPERIDONE ER 3 MG PO TB24: 3.0000 mg | ORAL_TABLET | Freq: Every day | ORAL | Status: DC

## 2013-06-05 MED ORDER — FLUOXETINE HCL 20 MG PO CAPS: 20.0000 mg | ORAL_CAPSULE | Freq: Every day | ORAL | Status: DC

## 2013-06-05 NOTE — Telephone Encounter (Signed)
Sent pt. No Show letter to call and re-schedule postpartum appointment.

## 2013-06-05 NOTE — Progress Notes (Signed)
Per discussion with psychiatrist and NP, patient psychiatrically stable for discharge home. Pt plans to follow up with [facility]  and [facility].   [name] [phone]  ED CSW 06/05/2013 1056am

## 2013-06-05 NOTE — Telephone Encounter (Signed)
Pt. Missed post-partum today. Called and left message stating we are trying to reach her to reschedule her appointment, please call clinic.

## 2013-06-05 NOTE — Discharge Instructions (Signed)
Depression, Adult °Depression is feeling sad, low, down in the dumps, blue, gloomy, or empty. In general, there are two kinds of depression: °· Normal sadness or grief. This can happen after something upsetting. It often goes away on its own within 2 weeks. After losing a loved one (bereavement), normal sadness and grief may last longer than two weeks. It usually gets better with time. °· Clinical depression. This kind lasts longer than normal sadness or grief. It keeps you from doing the things you normally do in life. It is often hard to function at home, work, or at school. It may affect your relationships with others. Treatment is often needed. °GET HELP RIGHT AWAY IF: °· You have thoughts about hurting yourself or others. °· You lose touch with reality (psychotic symptoms). You may: °· See or hear things that are not real. °· Have untrue beliefs about your life or people around you. °· Your medicine is giving you problems. °MAKE SURE YOU: °· Understand these instructions. °· Will watch your condition. °· Will get help right away if you are not doing well or get worse. °Document Released: 05/16/2010 Document Revised: 01/06/2012 Document Reviewed: 08/13/2011 °[employer]® Patient Information ©2014 [employer]. ° °

## 2013-06-05 NOTE — [facility] Suicide Risk Assessment (Cosign Needed)
Suicide Risk Assessment  Discharge Assessment     Demographic Factors:  Low socioeconomic status  Total Time spent with patient: 30 minutes  Psychiatric Specialty Exam:     Blood pressure 116/78, pulse 70, temperature 97.8 F (36.6 C), temperature source Oral, resp. rate 18, SpO2 98.00%, currently breastfeeding.There is no weight on file to calculate BMI.  General Appearance: Casual  Eye Contact::  Good  Speech:  Normal Rate  Volume:  Normal  Mood:  Euthymic  Affect:  Congruent  Thought Process:  Coherent  Orientation:  Full (Time, Place, and Person)  Thought Content:  WDL  Suicidal Thoughts:  No  Homicidal Thoughts:  No  Memory:  Immediate;   Good Recent;   Good Remote;   Good  Judgement:  Good  Insight:  Fair  Psychomotor Activity:  Normal  Concentration:  Good  Recall:  Good  Fund of Knowledge:Fair  Language: Fair  Akathisia:  No  Handed:  Right  AIMS (if indicated):     Assets:  Physical Health Resilience  Sleep:       Musculoskeletal: Strength & Muscle Tone: within normal limits Gait & Station: normal Patient leans: N/A   Mental Status Per Nursing Assessment::   On Admission:     Current Mental Status by Physician: NA  Loss Factors: Baby in custody of DSS with plan to return to her  Historical Factors: NA  Risk Reduction Factors:   Responsible for children under 43 years of age, Living with another person, especially a relative and Positive therapeutic relationship  Continued Clinical Symptoms:  Bipolar Disorder:   Depressive phase  Cognitive Features That Contribute To Risk:  None  Suicide Risk:  Minimal: No identifiable suicidal ideation.  Patients presenting with no risk factors but with morbid ruminations; may be classified as minimal risk based on the severity of the depressive symptoms  Discharge Diagnoses:   AXIS I:  Adjustment Disorder with Depressed Mood, Anxiety Disorder NOS and Bipolar, Depressed AXIS II:  cognitive  deficits AXIS III:   Past Medical History  Diagnosis Date  . Anxiety   . Mental disorder   . Depression   . Cognitive deficits   . Bipolar 1 disorder    AXIS IV:  problems related to legal system/crime and problems related to social environment AXIS V:  61-70 mild symptoms  Plan Of Care/Follow-up recommendations:  Activity:  as tolerated Diet:  low-sodium heart healthy diet  Is patient on multiple antipsychotic therapies at discharge:  No   Has Patient had three or more failed trials of antipsychotic monotherapy by history:  No  Recommended Plan for Multiple Antipsychotic Therapies: NA    LORD, JAMISON, PMH-NP 06/05/2013, 10:55 AM

## 2013-06-14 ENCOUNTER — Encounter: Payer: Self-pay | Admitting: *Deleted

## 2013-07-09 ENCOUNTER — Encounter: Payer: Self-pay | Admitting: *Deleted

## 2013-07-21 ENCOUNTER — Ambulatory Visit: Payer: Self-pay | Admitting: Obstetrics & Gynecology

## 2013-08-28 ENCOUNTER — Encounter (HOSPITAL_COMMUNITY): Payer: Self-pay | Admitting: Emergency Medicine

## 2013-08-28 ENCOUNTER — Emergency Department (HOSPITAL_COMMUNITY)
Admission: EM | Admit: 2013-08-28 | Discharge: 2013-08-28 | Discharge status: Against Medical Advice | Payer: Medicaid Other | Attending: Emergency Medicine | Admitting: Emergency Medicine

## 2013-08-28 DIAGNOSIS — N898 Other specified noninflammatory disorders of vagina: Secondary | ICD-10-CM | POA: Insufficient documentation

## 2013-08-28 DIAGNOSIS — Z87891 Personal history of nicotine dependence: Secondary | ICD-10-CM | POA: Diagnosis not present

## 2013-08-28 NOTE — ED Notes (Signed)
Name called x 2 - no answer 

## 2013-08-28 NOTE — ED Notes (Signed)
Name called no answer x 3

## 2013-08-28 NOTE — ED Notes (Signed)
Pt reports being on birth control implant but having moderate vaginal bleeding x 2 weeks. No acute distress noted at triage.

## 2013-08-29 ENCOUNTER — Emergency Department (HOSPITAL_COMMUNITY)
Admission: EM | Admit: 2013-08-29 | Discharge: 2013-08-29 | Disposition: A | Discharge status: Home / Self Care | Payer: Medicaid Other | Attending: Emergency Medicine | Admitting: Emergency Medicine

## 2013-08-29 ENCOUNTER — Encounter (HOSPITAL_COMMUNITY): Payer: Self-pay | Admitting: Emergency Medicine

## 2013-08-29 DIAGNOSIS — F489 Nonpsychotic mental disorder, unspecified: Secondary | ICD-10-CM | POA: Insufficient documentation

## 2013-08-29 DIAGNOSIS — N939 Abnormal uterine and vaginal bleeding, unspecified: Secondary | ICD-10-CM

## 2013-08-29 DIAGNOSIS — Z87891 Personal history of nicotine dependence: Secondary | ICD-10-CM | POA: Insufficient documentation

## 2013-08-29 DIAGNOSIS — F3289 Other specified depressive episodes: Secondary | ICD-10-CM | POA: Insufficient documentation

## 2013-08-29 DIAGNOSIS — F411 Generalized anxiety disorder: Secondary | ICD-10-CM | POA: Insufficient documentation

## 2013-08-29 DIAGNOSIS — F329 Major depressive disorder, single episode, unspecified: Secondary | ICD-10-CM | POA: Insufficient documentation

## 2013-08-29 DIAGNOSIS — Z79899 Other long term (current) drug therapy: Secondary | ICD-10-CM | POA: Insufficient documentation

## 2013-08-29 DIAGNOSIS — N938 Other specified abnormal uterine and vaginal bleeding: Secondary | ICD-10-CM | POA: Insufficient documentation

## 2013-08-29 DIAGNOSIS — F09 Unspecified mental disorder due to known physiological condition: Secondary | ICD-10-CM | POA: Insufficient documentation

## 2013-08-29 DIAGNOSIS — F319 Bipolar disorder, unspecified: Secondary | ICD-10-CM | POA: Insufficient documentation

## 2013-08-29 DIAGNOSIS — N949 Unspecified condition associated with female genital organs and menstrual cycle: Secondary | ICD-10-CM | POA: Insufficient documentation

## 2013-08-29 DIAGNOSIS — Z880 Allergy status to penicillin: Secondary | ICD-10-CM | POA: Insufficient documentation

## 2013-08-29 LAB — WET PREP, GENITAL
CLUE CELLS WET PREP: NONE SEEN
TRICH WET PREP: NONE SEEN
YEAST WET PREP: NONE SEEN

## 2013-08-29 LAB — URINALYSIS, ROUTINE W REFLEX MICROSCOPIC
GLUCOSE, UA: NEGATIVE mg/dL
KETONES UR: 15 mg/dL — AB
Nitrite: NEGATIVE
PROTEIN: 30 mg/dL — AB
Specific Gravity, Urine: 1.037 — ABNORMAL HIGH (ref 1.005–1.030)
Urobilinogen, UA: 0.2 mg/dL (ref 0.0–1.0)
pH: 5.5 (ref 5.0–8.0)

## 2013-08-29 LAB — CBC
HEMATOCRIT: 34.9 % — AB (ref 36.0–46.0)
Hemoglobin: 11.6 g/dL — ABNORMAL LOW (ref 12.0–15.0)
MCH: 25.4 pg — ABNORMAL LOW (ref 26.0–34.0)
MCHC: 33.2 g/dL (ref 30.0–36.0)
MCV: 76.4 fL — ABNORMAL LOW (ref 78.0–100.0)
Platelets: 234 10*3/uL (ref 150–400)
RBC: 4.57 MIL/uL (ref 3.87–5.11)
RDW: 19.5 % — ABNORMAL HIGH (ref 11.5–15.5)
WBC: 10.3 10*3/uL (ref 4.0–10.5)

## 2013-08-29 LAB — COMPREHENSIVE METABOLIC PANEL
ALK PHOS: 50 U/L (ref 39–117)
ALT: 23 U/L (ref 0–35)
AST: 19 U/L (ref 0–37)
Albumin: 3.7 g/dL (ref 3.5–5.2)
BILIRUBIN TOTAL: 0.3 mg/dL (ref 0.3–1.2)
BUN: 16 mg/dL (ref 6–23)
CHLORIDE: 107 meq/L (ref 96–112)
CO2: 23 mEq/L (ref 19–32)
Calcium: 9.2 mg/dL (ref 8.4–10.5)
Creatinine, Ser: 0.68 mg/dL (ref 0.50–1.10)
GFR calc Af Amer: >90 mL/min (ref >90)
GFR calc non Af Amer: >90 mL/min (ref >90)
Glucose, Bld: 131 mg/dL — ABNORMAL HIGH (ref 70–99)
POTASSIUM: 4 meq/L (ref 3.7–5.3)
SODIUM: 143 meq/L (ref 137–147)
TOTAL PROTEIN: 7.7 g/dL (ref 6.0–8.3)

## 2013-08-29 LAB — URINE MICROSCOPIC-ADD ON

## 2013-08-29 LAB — PREGNANCY, URINE: PREG TEST UR: NEGATIVE

## 2013-08-29 MED ORDER — CEFTRIAXONE SODIUM 250 MG IJ SOLR
250.0000 mg | Freq: Once | INTRAMUSCULAR | Status: AC
  Administered 2013-08-29: 250 mg via INTRAMUSCULAR
  Filled 2013-08-29: qty 250

## 2013-08-29 MED ORDER — METRONIDAZOLE 500 MG PO TABS
2000.0000 mg | ORAL_TABLET | Freq: Once | ORAL | Status: AC
  Administered 2013-08-29: 2000 mg via ORAL
  Filled 2013-08-29: qty 4

## 2013-08-29 MED ORDER — AZITHROMYCIN 250 MG PO TABS
1000.0000 mg | ORAL_TABLET | Freq: Once | ORAL | Status: AC
  Administered 2013-08-29: 1000 mg via ORAL
  Filled 2013-08-29: qty 4

## 2013-08-29 MED ORDER — STERILE WATER FOR INJECTION IJ SOLN
INTRAMUSCULAR | Status: AC
  Administered 2013-08-29: 0.9 mL
  Filled 2013-08-29: qty 10

## 2013-08-29 NOTE — ED Provider Notes (Signed)
Medical screening examination/treatment/procedure(s) were performed by non-physician practitioner and as supervising physician I was immediately available for consultation/collaboration.   EKG Interpretation None        Osvaldo Shipper, MD 08/29/13 2021

## 2013-08-29 NOTE — Discharge Instructions (Signed)
Etonogestrel implant What is this medicine? ETONOGESTREL (et oh noe JES trel) is a contraceptive (birth control) device. It is used to prevent pregnancy. It can be used for up to 3 years. This medicine may be used for other purposes; ask your health care provider or pharmacist if you have questions. COMMON BRAND NAME(S): Implanon, Nexplanon  What should I tell my health care provider before I take this medicine? They need to know if you have any of these conditions: -abnormal vaginal bleeding -blood vessel disease or blood clots -cancer of the breast, cervix, or liver -depression -diabetes -gallbladder disease -headaches -heart disease or recent heart attack -high blood pressure -high cholesterol -kidney disease -liver disease -renal disease -seizures -tobacco smoker -an unusual or allergic reaction to etonogestrel, other hormones, anesthetics or antiseptics, medicines, foods, dyes, or preservatives -pregnant or trying to get pregnant -breast-feeding How should I use this medicine? This device is inserted just under the skin on the inner side of your upper arm by a health care professional. Talk to your pediatrician regarding the use of this medicine in children. Special care may be needed. Overdosage: If you think you've taken too much of this medicine contact a poison control center or emergency room at once. Overdosage: If you think you have taken too much of this medicine contact a poison control center or emergency room at once. NOTE: This medicine is only for you. Do not share this medicine with others. What if I miss a dose? This does not apply. What may interact with this medicine? Do not take this medicine with any of the following medications: -amprenavir -bosentan -fosamprenavir This medicine may also interact with the following medications: -barbiturate medicines for inducing sleep or treating seizures -certain medicines for fungal infections like ketoconazole and  itraconazole -griseofulvin -medicines to treat seizures like carbamazepine, felbamate, oxcarbazepine, phenytoin, topiramate -modafinil -phenylbutazone -rifampin -some medicines to treat HIV infection like atazanavir, indinavir, lopinavir, nelfinavir, tipranavir, ritonavir -St. John's wort This list may not describe all possible interactions. Give your health care provider a list of all the medicines, herbs, non-prescription drugs, or dietary supplements you use. Also tell them if you smoke, drink alcohol, or use illegal drugs. Some items may interact with your medicine. What should I watch for while using this medicine? This product does not protect you against HIV infection (AIDS) or other sexually transmitted diseases. You should be able to feel the implant by pressing your fingertips over the skin where it was inserted. Tell your doctor if you cannot feel the implant. What side effects may I notice from receiving this medicine? Side effects that you should report to your doctor or health care professional as soon as possible: -allergic reactions like skin rash, itching or hives, swelling of the face, lips, or tongue -breast lumps -changes in vision -confusion, trouble speaking or understanding -dark urine -depressed mood -general ill feeling or flu-like symptoms -light-colored stools -loss of appetite, nausea -right upper belly pain -severe headaches -severe pain, swelling, or tenderness in the abdomen -shortness of breath, chest pain, swelling in a leg -signs of pregnancy -sudden numbness or weakness of the face, arm or leg -trouble walking, dizziness, loss of balance or coordination -unusual vaginal bleeding, discharge -unusually weak or tired -yellowing of the eyes or skin Side effects that usually do not require medical attention (Report these to your doctor or health care professional if they continue or are bothersome.): -acne -breast pain -changes in  weight -cough -fever or chills -headache -irregular menstrual bleeding -itching, burning,  and vaginal discharge -pain or difficulty passing urine -sore throat This list may not describe all possible side effects. Call your doctor for medical advice about side effects. You may report side effects to FDA at 1-800-FDA-1088. Where should I keep my medicine? This drug is given in a hospital or clinic and will not be stored at home. NOTE: This sheet is a summary. It may not cover all possible information. If you have questions about this medicine, talk to your doctor, pharmacist, or health care provider.  2014, Elsevier/Gold Standard. (2011-10-19 15:37:45)   Dysfunctional Uterine Bleeding Normally, menstrual periods begin between ages 81 to 24 in young women. A normal menstrual cycle/period may begin every 23 days up to 35 days and lasts from 1 to 7 days. Around 12 to 14 days before your menstrual period starts, ovulation (ovary produces an egg) occurs. When counting the time between menstrual periods, count from the first day of bleeding of the previous period to the first day of bleeding of the next period. Dysfunctional (abnormal) uterine bleeding is bleeding that is different from a normal menstrual period. Your periods may come earlier or later than usual. They may be lighter, have blood clots or be heavier. You may have bleeding between periods, or you may skip one period or more. You may have bleeding after sexual intercourse, bleeding after menopause, or no menstrual period. CAUSES   Pregnancy (normal, miscarriage, tubal).  IUDs (intrauterine device, birth control).  Birth control pills.  Hormone treatment.  Menopause.  Infection of the cervix.  Blood clotting problems.  Infection of the inside lining of the uterus.  Endometriosis, inside lining of the uterus growing in the pelvis and other female organs.  Adhesions (scar tissue) inside the uterus.  Obesity or severe weight  loss.  Uterine polyps inside the uterus.  Cancer of the vagina, cervix, or uterus.  Ovarian cysts or polycystic ovary syndrome.  Medical problems (diabetes, thyroid disease).  Uterine fibroids (noncancerous tumor).  Problems with your female hormones.  Endometrial hyperplasia, very thick lining and enlarged cells inside of the uterus.  Medicines that interfere with ovulation.  Radiation to the pelvis or abdomen.  Chemotherapy. DIAGNOSIS   Your doctor will discuss the history of your menstrual periods, medicines you are taking, changes in your weight, stress in your life, and any medical problems you may have.  Your doctor will do a physical and pelvic examination.  Your doctor may want to perform certain tests to make a diagnosis, such as:  Pap test.  Blood tests.  Cultures for infection.  CT scan.  Ultrasound.  Hysteroscopy.  Laparoscopy.  MRI.  Hysterosalpingography.  D and C.  Endometrial biopsy. TREATMENT  Treatment will depend on the cause of the dysfunctional uterine bleeding (DUB). Treatment may include:  Observing your menstrual periods for a couple of months.  Prescribing medicines for medical problems, including:  Antibiotics.  Hormones.  Birth control pills.  Removing an IUD (intrauterine device, birth control).  Surgery:  D and C (scrape and remove tissue from inside the uterus).  Laparoscopy (examine inside the abdomen with a lighted tube).  Uterine ablation (destroy lining of the uterus with electrical current, laser, heat, or freezing).  Hysteroscopy (examine cervix and uterus with a lighted tube).  Hysterectomy (remove the uterus). HOME CARE INSTRUCTIONS   If medicines were prescribed, take exactly as directed. Do not change or switch medicines without consulting your caregiver.  Long term heavy bleeding may result in iron deficiency. Your caregiver may have prescribed  iron pills. They help replace the iron that your body  lost from heavy bleeding. Take exactly as directed.  Do not take aspirin or medicines that contain aspirin one week before or during your menstrual period. Aspirin may make the bleeding worse.  If you need to change your sanitary pad or tampon more than once every 2 hours, stay in bed with your feet elevated and a cold pack on your lower abdomen. Rest as much as possible, until the bleeding stops or slows down.  Eat well-balanced meals. Eat foods high in iron. Examples are:  Leafy green vegetables.  Whole-grain breads and cereals.  Eggs.  Meat.  Liver.  Do not try to lose weight until the abnormal bleeding has stopped and your blood iron level is back to normal. Do not lift more than ten pounds or do strenuous activities when you are bleeding.  For a couple of months, make note on your calendar, marking the start and ending of your period, and the type of bleeding (light, medium, heavy, spotting, clots or missed periods). This is for your caregiver to better evaluate your problem. SEEK MEDICAL CARE IF:   You develop nausea (feeling sick to your stomach) and vomiting, dizziness, or diarrhea while you are taking your medicine.  You are getting lightheaded or weak.  You have any problems that may be related to the medicine you are taking.  You develop pain with your DUB.  You want to remove your IUD.  You want to stop or change your birth control pills or hormones.  You have any type of abnormal bleeding mentioned above.  You are over 72 years old and have not had a menstrual period yet.  You are 23 years old and you are still having menstrual periods.  You have any of the symptoms mentioned above.  You develop a rash. SEEK IMMEDIATE MEDICAL CARE IF:   An oral temperature above 102 F (38.9 C) develops.  You develop chills.  You are changing your sanitary pad or tampon more than once an hour.  You develop abdominal pain.  You pass out or faint. Document Released:  04/10/2000 Document Revised: 07/06/2011 Document Reviewed: 03/12/2009 [facility] Patient Information 2014 [place].

## 2013-08-29 NOTE — ED Notes (Signed)
Pt reports vaginal bleeding x 3 weeks. States her period started about two weeks late. Pt was passing clots. Has been through 2 pads in last 24 hours.

## 2013-08-29 NOTE — ED Provider Notes (Signed)
CSN: [id number]     Arrival date & time 08/29/13  0815 History   First MD Initiated Contact with Patient 08/29/13 1118     Chief Complaint  Patient presents with  . Vaginal Bleeding     (Consider location/radiation/quality/duration/timing/severity/associated sxs/prior Treatment) HPI  23 year old female presents today with 3 weeks of vaginal bleeding.  She had a child 1 month ago without complications and shortly after started nexplanon birth control.  1 week after starting the medication she began to have her period.  It has continued since then, and has been much heavier than previous periods.  She has never taken any form of birth control in the past.  She has been changing a pad 3 times a day.  She has been feeling slightly weak.  She states that she took an iron pill and it seemed to help a little bit.  She has had no other associated symptoms such as nausea, vomiting, fevers, headaches, vision changes, SOB, chest pain, constipation, diarrhea, abdominal pain, vaginal discharge.    Past Medical History  Diagnosis Date  . Anxiety   . Mental disorder   . Depression   . Cognitive deficits   . Bipolar 1 disorder    Past Surgical History  Procedure Laterality Date  . Cesarean section    . Tonsillectomy N/A 06/03/2012    Procedure: TONSILLECTOMY;  Surgeon: [name], MD;  Location: [place];  Service: ENT;  Laterality: N/A;  . Mass excision N/A 06/03/2012    Procedure: EXCISION MASS;  Surgeon: [name], MD;  Location: [place];  Service: ENT;  Laterality: N/A;  Excision uvula mass  . Cesarean section N/A 04/25/2013    Procedure: REPEAT CESAREAN SECTION;  Surgeon: [name], MD;  Location: [facility];  Service: Obstetrics;  Laterality: N/A;   Family History  Problem Relation Age of Onset  . Hypertension Mother   . Diabetes Father    History  Substance Use Topics  . Smoking status: Former Smoker -- 7 years    Quit date: 09/30/2011  .  Smokeless tobacco: Never Used  . Alcohol Use: No     Comment: Patient denies    OB History   Grav Para Term Preterm Abortions TAB SAB Ect Mult Living   3 3 3  0 0 0 0 0 0 3     Review of Systems  Constitutional: Negative for diaphoresis.  HENT: Negative for dental problem.   Respiratory: Negative for shortness of breath and wheezing.   Genitourinary: Positive for vaginal bleeding. Negative for dysuria, vaginal discharge and pelvic pain.  Neurological: Negative for weakness and numbness.      Allergies  Penicillins  Home Medications   Prior to Admission medications   Medication Sig Start Date End Date Taking? Authorizing Provider  FLUoxetine (PROZAC) 20 MG capsule Take 1 capsule (20 mg total) by mouth daily. 06/05/13  Yes Waylan Boga, NP  paliperidone (INVEGA) 3 MG 24 hr tablet Take 1 tablet (3 mg total) by mouth daily. 06/05/13  Yes Waylan Boga, NP  Paliperidone Palmitate (INVEGA SUSTENNA IM) Inject 1 each into the muscle every 30 (thirty) days. Not sure of dose. Patient gets this at [facility]   Yes Historical Provider, MD   BP 129/71  Pulse 114  Temp(Src) 98.9 F (37.2 C) (Oral)  Resp 16  SpO2 100%  LMP 08/14/2013 Physical Exam  Nursing note and vitals reviewed. Constitutional: She appears well-developed and well-nourished. No distress.  HENT:  Head: Normocephalic  and atraumatic.  Eyes: Pupils are equal, round, and reactive to light.  Neck: Normal range of motion. Neck supple.  Cardiovascular: Normal rate and regular rhythm.   Pulmonary/Chest: Effort normal.  Abdominal: Soft.  Genitourinary: Uterus normal. There is bleeding (small amount in vaginal vault) around the vagina. No tenderness around the vagina. No foreign body around the vagina.  Neurological: She is alert.  Skin: Skin is warm and dry.    ED Course  Procedures (including critical care time) Labs Review Labs Reviewed  WET PREP, GENITAL - Abnormal; Notable for the following:    WBC, Wet Prep HPF POC  MODERATE (*)    All other components within normal limits  CBC - Abnormal; Notable for the following:    Hemoglobin 11.6 (*)    HCT 34.9 (*)    MCV 76.4 (*)    MCH 25.4 (*)    RDW 19.5 (*)    All other components within normal limits  COMPREHENSIVE METABOLIC PANEL - Abnormal; Notable for the following:    Glucose, Bld 131 (*)    All other components within normal limits  URINALYSIS, ROUTINE W REFLEX MICROSCOPIC - Abnormal; Notable for the following:    Color, Urine RED (*)    APPearance CLOUDY (*)    Specific Gravity, Urine 1.037 (*)    Hgb urine dipstick LARGE (*)    Bilirubin Urine SMALL (*)    Ketones, ur 15 (*)    Protein, ur 30 (*)    Leukocytes, UA SMALL (*)    All other components within normal limits  URINE MICROSCOPIC-ADD ON - Abnormal; Notable for the following:    Bacteria, UA FEW (*)    All other components within normal limits  GC/CHLAMYDIA PROBE AMP  PREGNANCY, URINE    Imaging Review No results found.   EKG Interpretation None      MDM   Final diagnoses:  Abnormal vaginal bleeding    Patients blood work is reassuring- sating well, no chest pain, hemoglobin is stable, not actively bleeding, no pain. She is having prolonged break through bleeding from the [place].  She has not yet contacted the providers that placed it. Her wet prep shows moderate WBC, will treat with Azithromycin, flagyl, rocephin. gc culture pending. She is to follow-up with health clinic and let them know that she is continuing to bleed. Referral to womens given.   23 y.o.Delois A Hellard's evaluation in the Emergency Department is complete. It has been determined that no acute conditions requiring further emergency intervention are present at this time. The patient/guardian have been advised of the diagnosis and plan. We have discussed signs and symptoms that warrant return to the ED, such as changes or worsening in symptoms.  Vital signs are stable at discharge. Filed Vitals:    08/29/13 1230  BP: 129/71  Pulse: 114  Temp:   Resp:     Patient/guardian has voiced understanding and agreed to follow-up with the PCP or specialist.    Linus Mako, PA-C 08/29/13 1315

## 2013-08-30 LAB — GC/CHLAMYDIA PROBE AMP
CT Probe RNA: NEGATIVE
GC Probe RNA: NEGATIVE

## 2013-10-17 ENCOUNTER — Encounter (HOSPITAL_COMMUNITY): Payer: Self-pay | Admitting: Emergency Medicine

## 2013-10-17 ENCOUNTER — Emergency Department (HOSPITAL_COMMUNITY)
Admission: EM | Admit: 2013-10-17 | Discharge: 2013-10-17 | Disposition: A | Discharge status: Home / Self Care | Payer: Medicaid Other | Attending: Emergency Medicine | Admitting: Emergency Medicine

## 2013-10-17 DIAGNOSIS — N898 Other specified noninflammatory disorders of vagina: Secondary | ICD-10-CM | POA: Insufficient documentation

## 2013-10-17 DIAGNOSIS — Z880 Allergy status to penicillin: Secondary | ICD-10-CM | POA: Insufficient documentation

## 2013-10-17 DIAGNOSIS — Z87891 Personal history of nicotine dependence: Secondary | ICD-10-CM | POA: Insufficient documentation

## 2013-10-17 DIAGNOSIS — F411 Generalized anxiety disorder: Secondary | ICD-10-CM | POA: Insufficient documentation

## 2013-10-17 DIAGNOSIS — F319 Bipolar disorder, unspecified: Secondary | ICD-10-CM | POA: Insufficient documentation

## 2013-10-17 DIAGNOSIS — Z9889 Other specified postprocedural states: Secondary | ICD-10-CM | POA: Insufficient documentation

## 2013-10-17 DIAGNOSIS — Z79899 Other long term (current) drug therapy: Secondary | ICD-10-CM | POA: Insufficient documentation

## 2013-10-17 DIAGNOSIS — N939 Abnormal uterine and vaginal bleeding, unspecified: Secondary | ICD-10-CM

## 2013-10-17 LAB — BASIC METABOLIC PANEL
BUN: 11 mg/dL (ref 6–23)
CO2: 23 mEq/L (ref 19–32)
CREATININE: 0.66 mg/dL (ref 0.50–1.10)
Calcium: 9.3 mg/dL (ref 8.4–10.5)
Chloride: 105 mEq/L (ref 96–112)
GFR calc Af Amer: >90 mL/min (ref >90)
Glucose, Bld: 103 mg/dL — ABNORMAL HIGH (ref 70–99)
Potassium: 4.1 mEq/L (ref 3.7–5.3)
SODIUM: 141 meq/L (ref 137–147)

## 2013-10-17 LAB — CBC WITH DIFFERENTIAL/PLATELET
BASOS ABS: 0 10*3/uL (ref 0.0–0.1)
Basophils Relative: 0 % (ref 0–1)
EOS ABS: 0.2 10*3/uL (ref 0.0–0.7)
EOS PCT: 3 % (ref 0–5)
HEMATOCRIT: 35.5 % — AB (ref 36.0–46.0)
Hemoglobin: 11.8 g/dL — ABNORMAL LOW (ref 12.0–15.0)
Lymphocytes Relative: 37 % (ref 12–46)
Lymphs Abs: 3.3 10*3/uL (ref 0.7–4.0)
MCH: 26.6 pg (ref 26.0–34.0)
MCHC: 33.2 g/dL (ref 30.0–36.0)
MCV: 80.1 fL (ref 78.0–100.0)
MONO ABS: 0.7 10*3/uL (ref 0.1–1.0)
Monocytes Relative: 8 % (ref 3–12)
Neutro Abs: 4.7 10*3/uL (ref 1.7–7.7)
Neutrophils Relative %: 52 % (ref 43–77)
Platelets: 199 10*3/uL (ref 150–400)
RBC: 4.43 MIL/uL (ref 3.87–5.11)
RDW: 15.5 % (ref 11.5–15.5)
WBC: 9 10*3/uL (ref 4.0–10.5)

## 2013-10-17 LAB — WET PREP, GENITAL
TRICH WET PREP: NONE SEEN
Yeast Wet Prep HPF POC: NONE SEEN

## 2013-10-17 NOTE — ED Provider Notes (Signed)
CSN: [id number]     Arrival date & time 10/17/13  1916 History   First MD Initiated Contact with Patient 10/17/13 1926     Chief Complaint  Patient presents with  . Vaginal Bleeding     (Consider location/radiation/quality/duration/timing/severity/associated sxs/prior Treatment) HPI  Patient with hx of cognitive delay and bipolar presents for vaginal bleeding. She states that she has had 2 weeks of vaginal bleeding when her mensis began. She uses implanon for [facility]. She had this occur once several years ago and denies workup. She states she uses one pad ever 2-3 hours which is  Different from her intake note. The patient has no history of easy bruising or bleeding. She denies any vaginal sxs such as foul odor, vaginal discharge, dyspareunia. She denies urinary sxs or abdominal sxs. Denies fevers, chills, myalgias, arthralgias. Denies DOE, SOB, chest tightness or pressure, radiation to left arm, jaw or back, or diaphoresis. Denies dysuria, flank pain, suprapubic pain, frequency, urgency, or hematuria. Denies headaches, light headedness, weakness, visual disturbances. Denies abdominal pain, nausea, vomiting, diarrhea or constipation.   Past Medical History  Diagnosis Date  . Anxiety   . Mental disorder   . Depression   . Cognitive deficits   . Bipolar 1 disorder    Past Surgical History  Procedure Laterality Date  . Cesarean section    . Tonsillectomy N/A 06/03/2012    Procedure: TONSILLECTOMY;  Surgeon: [name], MD;  Location: [place];  Service: ENT;  Laterality: N/A;  . Mass excision N/A 06/03/2012    Procedure: EXCISION MASS;  Surgeon: [name], MD;  Location: [place];  Service: ENT;  Laterality: N/A;  Excision uvula mass  . Cesarean section N/A 04/25/2013    Procedure: REPEAT CESAREAN SECTION;  Surgeon: [name], MD;  Location: [place];  Service: Obstetrics;  Laterality: N/A;   Family History  Problem Relation Age of Onset  .  Hypertension Mother   . Diabetes Father    History  Substance Use Topics  . Smoking status: Former Smoker -- 7 years    Quit date: 09/30/2011  . Smokeless tobacco: Never Used  . Alcohol Use: No     Comment: Patient denies    OB History   Grav Para Term Preterm Abortions TAB SAB Ect Mult Living   3 3 3  0 0 0 0 0 0 3     Review of Systems Ten systems reviewed and are negative for acute change, except as noted in the HPI.     Allergies  Penicillins  Home Medications   Prior to Admission medications   Medication Sig Start Date End Date Taking? Authorizing Provider  FLUoxetine (PROZAC) 40 MG capsule Take 40 mg by mouth daily.   Yes Historical Provider, MD  paliperidone (INVEGA) 6 MG 24 hr tablet Take 6 mg by mouth daily.   Yes Historical Provider, MD  Paliperidone Palmitate (INVEGA SUSTENNA IM) Inject 1 each into the muscle every 30 (thirty) days. Not sure of dose. Patient gets this at [facility]   Yes Historical Provider, MD   BP 122/63  Pulse 94  Temp(Src) 98.6 F (37 C) (Oral)  Resp 20  Ht 5\' 7"  (1.702 m)  Wt 247 lb (112.038 kg)  BMI 38.68 kg/m2  SpO2 99%  LMP 10/17/2013 Physical Exam  Nursing note and vitals reviewed. Constitutional: She is oriented to person, place, and time. She appears well-developed and well-nourished. No distress.  Morbidly obese  HENT:  Head: Normocephalic and atraumatic.  Eyes: Conjunctivae are normal. No scleral icterus.  Neck: Normal range of motion.  Cardiovascular: Normal rate, regular rhythm and normal heart sounds.  Exam reveals no gallop and no friction rub.   No murmur heard. Pulmonary/Chest: Effort normal and breath sounds normal. No respiratory distress.  No cva tenderness  Abdominal: Soft. Bowel sounds are normal. She exhibits no distension and no mass. There is no tenderness. There is no guarding.  Genitourinary:  Pelvic exam: normal external genitalia, vulva, vagina. i was unable to directly visualize the cervix after multiple  positional changes. Exam limited due to body habitus. Slight bleeding. No pain, cmt. No discharge. Chaperone was present during exam.    Neurological: She is alert and oriented to person, place, and time.  Skin: Skin is warm and dry. She is not diaphoretic.    ED Course  Procedures (including critical care time) Labs Review Labs Reviewed  WET PREP, GENITAL - Abnormal; Notable for the following:    Clue Cells Wet Prep HPF POC FEW (*)    WBC, Wet Prep HPF POC FEW (*)    All other components within normal limits  CBC WITH DIFFERENTIAL - Abnormal; Notable for the following:    Hemoglobin 11.8 (*)    HCT 35.5 (*)    All other components within normal limits  BASIC METABOLIC PANEL - Abnormal; Notable for the following:    Glucose, Bld 103 (*)    All other components within normal limits  GC/CHLAMYDIA PROBE AMP  URINALYSIS, ROUTINE W REFLEX MICROSCOPIC    Imaging Review No results found.   EKG Interpretation None      MDM   Final diagnoses:  Vaginal bleeding    Patient with asxs bv. Likely secondary to  Vaginal alkalization from 2 weeks of bleeding. I will have to patient follow up with [facility] for further evaluation. Stable anemia. G/c swab vaginal sample sent.    Margarita Mail, PA-C 10/18/13 1045

## 2013-10-17 NOTE — Discharge Instructions (Signed)
Abnormal Uterine Bleeding Abnormal uterine bleeding can affect women at various stages in life, including teenagers, women in their reproductive years, pregnant women, and women who have reached menopause. Several kinds of uterine bleeding are considered abnormal, including:  Bleeding or spotting between periods.   Bleeding after sexual intercourse.   Bleeding that is heavier or more than normal.   Periods that last longer than usual.  Bleeding after menopause.  Many cases of abnormal uterine bleeding are minor and simple to treat, while others are more serious. Any type of abnormal bleeding should be evaluated by your health care provider. Treatment will depend on the cause of the bleeding. HOME CARE INSTRUCTIONS Monitor your condition for any changes. The following actions may help to alleviate any discomfort you are experiencing:  Avoid the use of tampons and douches as directed by your health care provider.  Change your pads frequently. You should get regular pelvic exams and Pap tests. Keep all follow-up appointments for diagnostic tests as directed by your health care provider.  SEEK MEDICAL CARE IF:   Your bleeding lasts more than 1 week.   You feel dizzy at times.  SEEK IMMEDIATE MEDICAL CARE IF:   You pass out.   You are changing pads every 15 to 30 minutes.   You have abdominal pain.  You have a fever.   You become sweaty or weak.   You are passing large blood clots from the vagina.   You start to feel nauseous and vomit. MAKE SURE YOU:   Understand these instructions.  Will watch your condition.  Will get help right away if you are not doing well or get worse. Document Released: 04/13/2005 Document Revised: 04/18/2013 Document Reviewed: 11/10/2012 [employer] Patient Information 2015 [employer]. This information is not intended to replace advice given to you by your health care provider. Make sure you discuss any questions you have with your  health care provider.  

## 2013-10-17 NOTE — ED Notes (Signed)
Patient is here today for vaginal bleeding times two weeks. 2 pads hour.  Reports that it is starting to be intermittent in nature

## 2013-10-18 LAB — GC/CHLAMYDIA PROBE AMP
CT Probe RNA: NEGATIVE
GC PROBE AMP APTIMA: NEGATIVE

## 2013-10-20 NOTE — ED Provider Notes (Signed)
Medical screening examination/treatment/procedure(s) were performed by non-physician practitioner and as supervising physician I was immediately available for consultation/collaboration.   EKG Interpretation None        Hoy Morn, MD 10/20/13 [phone]

## 2013-11-22 ENCOUNTER — Ambulatory Visit: Payer: Self-pay | Admitting: Advanced Practice Midwife

## 2013-11-22 ENCOUNTER — Telehealth: Payer: Self-pay | Admitting: *Deleted

## 2013-11-22 NOTE — Telephone Encounter (Signed)
[name] missed a scheduled appointment for vaginal bleeding. CalledTiffany and notified her she had missed the appointment she scheduled. Asked her if she would like to reschedule. She states she would not like to reschedule. I asked her if she was having any problems, which she denied. Instructed her to call us back if she needs to see [place].

## 2013-12-04 ENCOUNTER — Encounter (HOSPITAL_COMMUNITY): Payer: Self-pay | Admitting: Emergency Medicine

## 2013-12-04 ENCOUNTER — Emergency Department (HOSPITAL_COMMUNITY)
Admission: EM | Admit: 2013-12-04 | Discharge: 2013-12-04 | Disposition: A | Discharge status: Other Acute Inpatient Hospital | Payer: Medicaid Other | Attending: Emergency Medicine | Admitting: Emergency Medicine

## 2013-12-04 ENCOUNTER — Inpatient Hospital Stay (HOSPITAL_COMMUNITY)
Admission: RE | Admit: 2013-12-04 | Discharge: 2013-12-08 | DRG: 885 | Disposition: A | Discharge status: Home / Self Care | Payer: Medicaid Other | Attending: Psychiatry | Admitting: Psychiatry

## 2013-12-04 DIAGNOSIS — F411 Generalized anxiety disorder: Secondary | ICD-10-CM | POA: Diagnosis present

## 2013-12-04 DIAGNOSIS — R4189 Other symptoms and signs involving cognitive functions and awareness: Secondary | ICD-10-CM

## 2013-12-04 DIAGNOSIS — F3130 Bipolar disorder, current episode depressed, mild or moderate severity, unspecified: Secondary | ICD-10-CM | POA: Diagnosis present

## 2013-12-04 DIAGNOSIS — R45851 Suicidal ideations: Secondary | ICD-10-CM | POA: Diagnosis present

## 2013-12-04 DIAGNOSIS — Z833 Family history of diabetes mellitus: Secondary | ICD-10-CM | POA: Diagnosis not present

## 2013-12-04 DIAGNOSIS — Z79899 Other long term (current) drug therapy: Secondary | ICD-10-CM | POA: Insufficient documentation

## 2013-12-04 DIAGNOSIS — Z8249 Family history of ischemic heart disease and other diseases of the circulatory system: Secondary | ICD-10-CM

## 2013-12-04 DIAGNOSIS — Z3202 Encounter for pregnancy test, result negative: Secondary | ICD-10-CM | POA: Diagnosis not present

## 2013-12-04 DIAGNOSIS — Z559 Problems related to education and literacy, unspecified: Secondary | ICD-10-CM

## 2013-12-04 DIAGNOSIS — Z87891 Personal history of nicotine dependence: Secondary | ICD-10-CM

## 2013-12-04 DIAGNOSIS — Z880 Allergy status to penicillin: Secondary | ICD-10-CM | POA: Diagnosis not present

## 2013-12-04 DIAGNOSIS — F17200 Nicotine dependence, unspecified, uncomplicated: Secondary | ICD-10-CM | POA: Diagnosis not present

## 2013-12-04 DIAGNOSIS — F319 Bipolar disorder, unspecified: Secondary | ICD-10-CM | POA: Insufficient documentation

## 2013-12-04 DIAGNOSIS — G4700 Insomnia, unspecified: Secondary | ICD-10-CM | POA: Diagnosis present

## 2013-12-04 DIAGNOSIS — F39 Unspecified mood [affective] disorder: Secondary | ICD-10-CM | POA: Diagnosis not present

## 2013-12-04 DIAGNOSIS — F09 Unspecified mental disorder due to known physiological condition: Secondary | ICD-10-CM | POA: Insufficient documentation

## 2013-12-04 LAB — COMPREHENSIVE METABOLIC PANEL
ALK PHOS: 66 U/L (ref 39–117)
ALT: 26 U/L (ref 0–35)
AST: 26 U/L (ref 0–37)
Albumin: 3.9 g/dL (ref 3.5–5.2)
Anion gap: 13 (ref 5–15)
BILIRUBIN TOTAL: 0.3 mg/dL (ref 0.3–1.2)
BUN: 10 mg/dL (ref 6–23)
CALCIUM: 9.6 mg/dL (ref 8.4–10.5)
CO2: 23 meq/L (ref 19–32)
Chloride: 102 mEq/L (ref 96–112)
Creatinine, Ser: 0.65 mg/dL (ref 0.50–1.10)
GLUCOSE: 90 mg/dL (ref 70–99)
POTASSIUM: 3.9 meq/L (ref 3.7–5.3)
Sodium: 138 mEq/L (ref 137–147)
Total Protein: 7.8 g/dL (ref 6.0–8.3)

## 2013-12-04 LAB — ETHANOL: Alcohol, Ethyl (B): <11 mg/dL (ref 0–11)

## 2013-12-04 LAB — CBC
HCT: 39.7 % (ref 36.0–46.0)
HEMOGLOBIN: 13.1 g/dL (ref 12.0–15.0)
MCH: 26.5 pg (ref 26.0–34.0)
MCHC: 33 g/dL (ref 30.0–36.0)
MCV: 80.2 fL (ref 78.0–100.0)
Platelets: 222 10*3/uL (ref 150–400)
RBC: 4.95 MIL/uL (ref 3.87–5.11)
RDW: 13.9 % (ref 11.5–15.5)
WBC: 7.1 10*3/uL (ref 4.0–10.5)

## 2013-12-04 LAB — RAPID URINE DRUG SCREEN, HOSP PERFORMED
AMPHETAMINES: NOT DETECTED
BARBITURATES: NOT DETECTED
BENZODIAZEPINES: NOT DETECTED
Cocaine: NOT DETECTED
Opiates: NOT DETECTED
Tetrahydrocannabinol: NOT DETECTED

## 2013-12-04 LAB — POC URINE PREG, ED: Preg Test, Ur: NEGATIVE

## 2013-12-04 LAB — SALICYLATE LEVEL: Salicylate Lvl: <2 mg/dL — ABNORMAL LOW (ref 2.8–20.0)

## 2013-12-04 LAB — ACETAMINOPHEN LEVEL: Acetaminophen (Tylenol), Serum: <15 ug/mL (ref 10–30)

## 2013-12-04 MED ORDER — IBUPROFEN 200 MG PO TABS: 600.0000 mg | ORAL_TABLET | Freq: Three times a day (TID) | ORAL | Status: DC | PRN

## 2013-12-04 MED ORDER — ONDANSETRON HCL 4 MG PO TABS: 4.0000 mg | ORAL_TABLET | Freq: Three times a day (TID) | ORAL | Status: DC | PRN

## 2013-12-04 MED ORDER — PALIPERIDONE ER 6 MG PO TB24: 6.0000 mg | ORAL_TABLET | Freq: Every day | ORAL | Status: DC

## 2013-12-04 MED ORDER — LORAZEPAM 1 MG PO TABS: 1.0000 mg | ORAL_TABLET | Freq: Three times a day (TID) | ORAL | Status: DC | PRN

## 2013-12-04 MED ORDER — NICOTINE 21 MG/24HR TD PT24: 21.0000 mg | MEDICATED_PATCH | Freq: Every day | TRANSDERMAL | Status: DC

## 2013-12-04 MED ORDER — ALUM & MAG HYDROXIDE-SIMETH 200-200-20 MG/5ML PO SUSP: 30.0000 mL | ORAL | Status: DC | PRN

## 2013-12-04 MED ORDER — ZOLPIDEM TARTRATE 5 MG PO TABS: 5.0000 mg | ORAL_TABLET | Freq: Every evening | ORAL | Status: DC | PRN

## 2013-12-04 MED ORDER — FLUOXETINE HCL 20 MG PO CAPS: 40.0000 mg | ORAL_CAPSULE | Freq: Every day | ORAL | Status: DC

## 2013-12-04 NOTE — BH Assessment (Signed)
Assessment Note  [name] is an 23 y.o. female. Pt presents voluntarily as walk in to [facility]. She reports she called [employer] who brought her to [facility]. Pt endorses SI with plan to overdose on meds. Pt reports 3 prior suicide attempts. Pt is oriented x 4 and is cooperative. Pt is soft spoken and her affect is depressed. Pt sts she had to give up her 1 mo old baby three mos ago "for adoption". Per chart review, pt was inpatient at [facility] in June 2014, 3 x 2012 and 3 x 2006 for SI and depression. Pt sts she was d/c from [facility] for SI a few mos ago. Pt endorses fatigue, guilt, loss of interest in usual pleasures, worthlessness, hopelessness, and tearfulness. Pt sts her mother is supportive. Pt sts she lives alone. Pt denies HI. She denies [facility] and no delusions noted. Pt says, "I'm ready to end my life." Pt reports she used to be a patient with [facility] ACT Team, but pt sts she thinks the agency has closed. Pt says she hasn't had her psych meds in three weeks d/t the ACT Team closing. Pt reports an IQ of 65, however there is no documentation supporting pt's claim. Writer ran pt by Catalina Pizza NP. Heloise Purpura recommends that pt be admitted to an inpatient program. He sts that if pt's IQ is documented as less than 98, then pt meets exclusionary criteria for [facility].    Axis I: Major Depressive Disorder, Recurrent, Severe Axis II: Deferred Axis III:  Past Medical History  Diagnosis Date  . Anxiety   . Mental disorder   . Depression   . Cognitive deficits   . Bipolar 1 disorder    Axis IV: other psychosocial or environmental problems and problems related to social environment Axis V: 31-40 impairment in reality testing  Past Medical History:  Past Medical History  Diagnosis Date  . Anxiety   . Mental disorder   . Depression   . Cognitive deficits   . Bipolar 1 disorder     Past Surgical History  Procedure Laterality Date  . Cesarean section    . Tonsillectomy N/A 06/03/2012   Procedure: TONSILLECTOMY;  Surgeon: [name], MD;  Location: [place];  Service: ENT;  Laterality: N/A;  . Mass excision N/A 06/03/2012    Procedure: EXCISION MASS;  Surgeon: [name], MD;  Location: [place];  Service: ENT;  Laterality: N/A;  Excision uvula mass  . Cesarean section N/A 04/25/2013    Procedure: REPEAT CESAREAN SECTION;  Surgeon: [name], MD;  Location: [facility];  Service: Obstetrics;  Laterality: N/A;    Family History:  Family History  Problem Relation Age of Onset  . Hypertension Mother   . Diabetes Father     Social History:  reports that she quit smoking about 2 years ago. She has never used smokeless tobacco. She reports that she does not drink alcohol or use illicit drugs.  Additional Social History:  Alcohol / Drug Use Pain Medications: pt denies abuse Prescriptions: pt denies abuse Over the Counter: pt denies abuse History of alcohol / drug use?: No history of alcohol / drug abuse Longest period of sobriety (when/how long): N/A  CIWA:   COWS:    Allergies:  Allergies  Allergen Reactions  . Penicillins Hives    Face breaks out.    Home Medications:  (Not in a hospital admission)  OB/GYN Status:  Patient's last menstrual period was 10/04/2013.  General Assessment Data Location of Assessment: [facility] Is this a Tele or Face-to-Face Assessment?: Face-to-Face Is this an Initial Assessment or a Re-assessment for this encounter?: Initial Assessment Living Arrangements: Alone Can pt return to current living arrangement?: Yes Admission Status: Voluntary Is patient capable of signing voluntary admission?: Yes Transfer from: Home Referral Source: Other (pt called [employer])     [place] Living Arrangements: Alone Name of Psychiatrist: none Name of Therapist: none  Education Status Is patient currently in school?: No Highest grade of school patient has completed: 77 (pt  states she took "special classes") Name of school: [employer]  Risk to self with the past 6 months Suicidal Ideation: Yes-Currently Present Suicidal Intent: No Is patient at risk for suicide?: Yes Suicidal Plan?: Yes-Currently Present Specify Current Suicidal Plan: pt sts she will overdose on pills  Access to Means: Yes Specify Access to Suicidal Means: access to pills What has been your use of drugs/alcohol within the last 12 months?: none Previous Attempts/Gestures: Yes How many times?: 3 Other Self Harm Risks: none Triggers for Past Attempts: Unpredictable;Family contact;Other personal contacts Intentional Self Injurious Behavior: None (pt sts hasn't cut herself in years but thought about it toda) Family Suicide History: No Recent stressful life event(s):  (n/a) Persecutory voices/beliefs?: No Depression: Yes Depression Symptoms: Tearfulness;Despondent;Feeling worthless/self pity;Loss of interest in usual pleasures;Isolating;Fatigue Substance abuse history and/or treatment for substance abuse?: No Suicide prevention information given to non-admitted patients: Not applicable  Risk to Others within the past 6 months Homicidal Ideation: No Thoughts of Harm to Others: No Current Homicidal Intent: No Current Homicidal Plan: No Access to Homicidal Means: No Identified Victim: none History of harm to others?: No Assessment of Violence: None Noted Violent Behavior Description: pt calm and polite Does patient have access to weapons?: No Criminal Charges Pending?: No Does patient have a court date: No  Psychosis Hallucinations: None noted Delusions: None noted  Mental Status Report Appear/Hygiene: Unremarkable;Other (Comment) (in street clothes) Eye Contact: Good Motor Activity: Freedom of movement Speech: Logical/coherent;Soft;Slow Level of Consciousness: Alert;Quiet/awake Mood: Depressed;Sad;Anhedonia Affect: Appropriate to circumstance Anxiety Level: None Thought  Processes: Relevant;Coherent Judgement: Unimpaired Orientation: Person;Place;Time;Situation Obsessive Compulsive Thoughts/Behaviors: None  Cognitive Functioning Concentration: Normal Memory: Recent Intact;Remote Intact IQ: Average Insight: Fair Impulse Control: Fair Appetite: Fair Sleep: No Change Total Hours of Sleep: 5 Vegetative Symptoms: None  ADLScreening [facility]) Patient's cognitive ability adequate to safely complete daily activities?: Yes Patient able to express need for assistance with ADLs?: Yes Independently performs ADLs?: Yes (appropriate for developmental age)  Prior Inpatient Therapy Prior Inpatient Therapy: Yes Prior Therapy Dates: 2015, 2014, 2012 & 2006 Prior Therapy Facilty/Provider(s): 2015 ([facility]), other dates were at [facility] Reason for Treatment: depression, SI  Prior Outpatient Therapy Prior Outpatient Therapy: Yes Prior Therapy Dates: until 3 wks ago Prior Therapy Facilty/Provider(s): [facility] (pt [place] is closing) Reason for Treatment: depression, med management  ADL Screening (condition at time of admission) Patient's cognitive ability adequate to safely complete daily activities?: Yes Is the patient deaf or have difficulty hearing?: No Does the patient have difficulty seeing, even when wearing glasses/contacts?: No Does the patient have difficulty concentrating, remembering, or making decisions?: No Patient able to express need for assistance with ADLs?: Yes Does the patient have difficulty dressing or bathing?: No Independently performs ADLs?: Yes (appropriate for developmental age) Does the patient have difficulty walking or climbing stairs?: No Weakness of Legs: None Weakness of Arms/Hands: None  Home Assistive Devices/Equipment Home Assistive Devices/Equipment: None    Abuse/Neglect Assessment (Assessment to be complete while patient is alone) Physical Abuse: Denies Verbal Abuse:  Denies Sexual Abuse: Denies Exploitation of patient/patient's resources: Denies Self-Neglect: Denies     Regulatory affairs officer (For Healthcare) Advance Directive: Patient does not have advance directive    Additional Information 1:1 In Past 12 Months?: No CIRT Risk: No Elopement Risk: No Does patient have medical clearance?: No     Disposition:  Disposition Initial Assessment Completed for this Encounter: Yes Disposition of Patient: Inpatient treatment program Type of inpatient treatment program: Adult Catalina Pizza NP recs inpatient)  On Site Evaluation by:   Reviewed with Physician:    Nyoka Lint 12/04/2013 6:16 PM

## 2013-12-04 NOTE — ED Notes (Addendum)
Patient transferred for suicidal ideation with a plan to overdose on her medication. The patient says she has attempted suicide 2-3 times in the past and has been hospitalized at [facility] and [facility] She said she felt suicidal tonight because she was bored. She lives alone in an apartment. She went as far as the eleventh grade in school and is able to write but she says that she cannot read. She says she has taken Prozac and Invega in the past that she got from [facility] but says that they closed. She denies seeing a psychiatrist at present. She denies having any medical problems. Denies HI, AVH, and substance abuse. Mood is sad, affect is flat. Patient is calm and cooperative and denies any complaints at this time. Libby Maw, RN

## 2013-12-04 NOTE — ED Provider Notes (Signed)
CSN: [id number]     Arrival date & time 12/04/13  1731 History   First MD Initiated Contact with Patient 12/04/13 1810     Chief Complaint  Patient presents with  . Suicidal     (Consider location/radiation/quality/duration/timing/severity/associated sxs/prior Treatment) The history is provided by the patient and medical records. No language interpreter was used.    [name] is a 23 y.o. female  with a hx of anxiety, depression, Bipolar 1 disorder presents to the Emergency Department complaining of gradual, persistent, progressively worsening SI onset this morning. Pt with a Hx of same in her teen years.  Her plan was to OD on "pills," but nothing specific.  Pt reports she wanted to hurt herself because she was board, but then admitted that her 3 daughters are up for adoption and this makes her sad.  She reports she has not had her meds in 3 weeks because of a problem with her ACT team.  Associated symptoms include depression. Nothing makes it better or worse.  Pt reports she is a former smoker and denies EtOH and drug abuse.  Pt denies HI, auditory/visual hallucinations.  Pt denies fever, chills, headache, neck pain, chest pain, SOB, abd pain, N/V/D, weakness, dizziness, syncope, dysuria, hematuria.      Past Medical History  Diagnosis Date  . Anxiety   . Mental disorder   . Depression   . Cognitive deficits   . Bipolar 1 disorder    Past Surgical History  Procedure Laterality Date  . Cesarean section    . Tonsillectomy N/A 06/03/2012    Procedure: TONSILLECTOMY;  Surgeon: [name], MD;  Location: [place];  Service: ENT;  Laterality: N/A;  . Mass excision N/A 06/03/2012    Procedure: EXCISION MASS;  Surgeon: [name], MD;  Location: [place];  Service: ENT;  Laterality: N/A;  Excision uvula mass  . Cesarean section N/A 04/25/2013    Procedure: REPEAT CESAREAN SECTION;  Surgeon: [name], MD;  Location: [facility];  Service:  Obstetrics;  Laterality: N/A;   Family History  Problem Relation Age of Onset  . Hypertension Mother   . Diabetes Father    History  Substance Use Topics  . Smoking status: Former Smoker -- 7 years    Quit date: 09/30/2011  . Smokeless tobacco: Never Used  . Alcohol Use: No     Comment: Patient denies    OB History   Grav Para Term Preterm Abortions TAB SAB Ect Mult Living   3 3 3  0 0 0 0 0 0 3     Review of Systems  Constitutional: Negative for fever, diaphoresis, appetite change, fatigue and unexpected weight change.  HENT: Negative for mouth sores.   Eyes: Negative for visual disturbance.  Respiratory: Negative for cough, chest tightness, shortness of breath and wheezing.   Cardiovascular: Negative for chest pain.  Gastrointestinal: Negative for nausea, vomiting, abdominal pain, diarrhea and constipation.  Endocrine: Negative for polydipsia, polyphagia and polyuria.  Genitourinary: Negative for dysuria, urgency, frequency and hematuria.  Musculoskeletal: Negative for back pain and neck stiffness.  Skin: Negative for rash.  Allergic/Immunologic: Negative for immunocompromised state.  Neurological: Negative for syncope, light-headedness and headaches.  Hematological: Does not bruise/bleed easily.  Psychiatric/Behavioral: Positive for suicidal ideas. Negative for sleep disturbance. The patient is nervous/anxious.       Allergies  Penicillins  Home Medications   Prior to Admission medications   Medication Sig Start Date End Date Taking? Authorizing Provider  FLUoxetine (PROZAC) 40 MG capsule Take 40 mg by mouth daily.   Yes Historical Provider, MD  paliperidone (INVEGA) 6 MG 24 hr tablet Take 6 mg by mouth daily.   Yes Historical Provider, MD  Paliperidone Palmitate (INVEGA SUSTENNA IM) Inject 1 each into the muscle every 30 (thirty) days. Not sure of dose. Patient gets this at [facility]   Yes Historical Provider, MD   BP 132/83  Pulse 81  Resp 18  SpO2 100%  LMP  10/04/2013 Physical Exam  Nursing note and vitals reviewed. Constitutional: She is oriented to person, place, and time. She appears well-developed and well-nourished. No distress.  Awake, alert, nontoxic appearance  HENT:  Head: Normocephalic and atraumatic.  Mouth/Throat: Oropharynx is clear and moist. No oropharyngeal exudate.  Eyes: Conjunctivae are normal. No scleral icterus.  Neck: Normal range of motion. Neck supple.  Cardiovascular: Normal rate, regular rhythm, normal heart sounds and intact distal pulses.   No murmur heard. Pulmonary/Chest: Effort normal and breath sounds normal. No respiratory distress. She has no wheezes.  Equal chest expansion  Abdominal: Soft. Bowel sounds are normal. She exhibits no mass. There is no tenderness. There is no rebound and no guarding.  Musculoskeletal: Normal range of motion. She exhibits no edema.  Neurological: She is alert and oriented to person, place, and time. She exhibits normal muscle tone. Coordination normal.  Speech is clear and goal oriented Moves extremities without ataxia  Skin: Skin is warm and dry. She is not diaphoretic. No erythema.  Psychiatric: Her mood appears anxious. She is not actively hallucinating. Cognition and memory are not impaired. She does not express impulsivity. She exhibits a depressed mood. She expresses suicidal ideation. She expresses no homicidal ideation. She expresses suicidal plans. She expresses no homicidal plans.    ED Course  Procedures (including critical care time) Labs Review Labs Reviewed  SALICYLATE LEVEL - Abnormal; Notable for the following:    Salicylate Lvl <4.9 (*)    All other components within normal limits  ACETAMINOPHEN LEVEL  CBC  COMPREHENSIVE METABOLIC PANEL  ETHANOL  URINE RAPID DRUG SCREEN (HOSP PERFORMED)  POC URINE PREG, ED    Imaging Review No results found.   EKG Interpretation None      MDM   Final diagnoses:  Suicidal ideation  Cognitive deficits   Generalized anxiety disorder  Bipolar I disorder, most recent episode (or current) depressed, unspecified   [name] presents with SI today with a plan to OD.  She has a Hx of same.  No major medical problems.  Pt denies somatic symptoms, HI, auditory, visual hallucinations.  Labs pending.  Pt is currently here voluntarily and wants treatment.  Discussed with her that if she attempts to leave we will have to fill out IVC paperwork, but she is amenable to staying.    7:11 PM Pt meets inpatient criteria however she cannot go to [facility] if her IQ score is < 70.  UDS and Upreg pending.  Pt is otherwise medically cleared.   8:25 PM Labs unremarkable, UDS and U preg negative.    BP 132/83  Pulse 81  Resp 18  SpO2 100%  LMP 10/04/2013    Abigail Butts, PA-C 12/04/13 2026

## 2013-12-04 NOTE — ED Notes (Signed)
Pt transported to [facility] by [facility] transportation for continuation of specialized care. She left in no acute distress.

## 2013-12-04 NOTE — ED Notes (Signed)
Pt has hx of Suicidal Ideation.  Pt started having thoughts of taking pills and hurting self today.  Pt takes meds for anxiety/depression and has not taken them in 2 weeks.  Pt unable to get meds.  No HI.  No auditory/visual hallucinations.

## 2013-12-05 ENCOUNTER — Encounter (HOSPITAL_COMMUNITY): Payer: Self-pay | Admitting: Behavioral Health

## 2013-12-05 DIAGNOSIS — F3130 Bipolar disorder, current episode depressed, mild or moderate severity, unspecified: Principal | ICD-10-CM

## 2013-12-05 DIAGNOSIS — F39 Unspecified mood [affective] disorder: Secondary | ICD-10-CM | POA: Diagnosis not present

## 2013-12-05 MED ORDER — BUPROPION HCL ER (XL) 150 MG PO TB24
150.0000 mg | ORAL_TABLET | Freq: Every day | ORAL | Status: DC
  Administered 2013-12-05 – 2013-12-08 (×4): 150 mg via ORAL
  Filled 2013-12-05: qty 14
  Filled 2013-12-05 (×7): qty 1

## 2013-12-05 MED ORDER — ACETAMINOPHEN 325 MG PO TABS: 650.0000 mg | ORAL_TABLET | Freq: Four times a day (QID) | ORAL | Status: DC | PRN

## 2013-12-05 MED ORDER — HALOPERIDOL 5 MG PO TABS: 5.0000 mg | ORAL_TABLET | Freq: Four times a day (QID) | ORAL | Status: DC | PRN

## 2013-12-05 MED ORDER — BENZTROPINE MESYLATE 0.5 MG PO TABS
0.5000 mg | ORAL_TABLET | Freq: Every day | ORAL | Status: DC | PRN
  Filled 2013-12-05 (×2): qty 14

## 2013-12-05 MED ORDER — PALIPERIDONE ER 6 MG PO TB24
6.0000 mg | ORAL_TABLET | Freq: Every day | ORAL | Status: DC
  Administered 2013-12-05 – 2013-12-07 (×3): 6 mg via ORAL
  Filled 2013-12-05: qty 14
  Filled 2013-12-05 (×5): qty 1

## 2013-12-05 MED ORDER — MAGNESIUM HYDROXIDE 400 MG/5ML PO SUSP: 30.0000 mL | Freq: Every day | ORAL | Status: DC | PRN

## 2013-12-05 MED ORDER — ALUM & MAG HYDROXIDE-SIMETH 200-200-20 MG/5ML PO SUSP: 30.0000 mL | ORAL | Status: DC | PRN

## 2013-12-05 MED ORDER — TRAZODONE HCL 50 MG PO TABS
50.0000 mg | ORAL_TABLET | Freq: Every evening | ORAL | Status: DC | PRN
  Administered 2013-12-05 – 2013-12-07 (×3): 50 mg via ORAL
  Filled 2013-12-05: qty 28
  Filled 2013-12-05 (×7): qty 1
  Filled 2013-12-05: qty 28
  Filled 2013-12-05: qty 1

## 2013-12-05 MED ORDER — BENZTROPINE MESYLATE 1 MG PO TABS: 1.0000 mg | ORAL_TABLET | Freq: Two times a day (BID) | ORAL | Status: DC

## 2013-12-05 MED ORDER — HYDROXYZINE HCL 25 MG PO TABS
25.0000 mg | ORAL_TABLET | Freq: Four times a day (QID) | ORAL | Status: DC | PRN
Start: 2013-12-05 — End: 2013-12-08

## 2013-12-05 NOTE — Tx Team (Signed)
Initial Interdisciplinary Treatment Plan   PATIENT STRESSORS: Loss of children to adoption Medication change or noncompliance Traumatic event   PROBLEM LIST: Problem List/Patient Goals Date to be addressed Date deferred Reason deferred Estimated date of resolution  Suicidal ideations 12/04/2013     Medication noncompliance 12/04/2013     depression 12/04/2013                                          DISCHARGE CRITERIA:  Ability to meet basic life and health needs Improved stabilization in mood, thinking, and/or behavior Motivation to continue treatment in a less acute level of care Safe-care adequate arrangements made  PRELIMINARY DISCHARGE PLAN: Attend aftercare/continuing care group Attend PHP/IOP Outpatient therapy Return to previous living arrangement  PATIENT/FAMIILY INVOLVEMENT: This treatment plan has been presented to and reviewed with the patient, [name].  The patient and family have been given the opportunity to ask questions and make suggestions.  [name] 12/05/2013, 12:36 AM

## 2013-12-05 NOTE — H&P (Signed)
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 She reports, "I went to the [facility] last night. I had suicidal thoughts that lasted for about 4 hours. I got bored doing nothing. It got to a point where I could no longer take it. I don't work. I need a job, something to do during the day to pass my times. 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 I'm not depressed. When I was having the suicidal thoughts, I was thinking about taking some pills to over dose. I attempted suicide about a year ago by overdose. I took 2 pills. I was depressed that time, but not this time. I was at the [facility] 3 months ago for suicidal thoughts also. I was told that I have Bipolar disorder, but I don't believe that I do. I take Invega shots, last dose about 1 month ago"  Elements:  Location:  Suicidal ideations. Quality:  "I was feeling bored". Severity:  Moderate. Timing:  Started yesterday, but has hx of hospitalizations due to SI. Duration:  Chronic. Context:  "I had nothing to do, got bored, thought about overdosing on my pills".  Associated Signs/Synptoms: Depression Symptoms:  Hx. suicidal ideations with plans to OD  (Hypo) Manic Symptoms:  Denies  Anxiety Symptoms:  Excessive Worry,  Psychotic Symptoms:  Denies  PTSD Symptoms: None  Psychiatric Specialty Exam: Physical Exam  Constitutional: She is oriented to person, place, and time. She appears well-developed.  Obese  HENT:  Head: Normocephalic.  Eyes: Pupils are equal, round, and reactive to light.  Neck: Normal range of motion.  Cardiovascular: Normal rate.   Respiratory: Effort normal.  GI: Soft.  Genitourinary:  Did not assess.   Musculoskeletal: Normal range of motion.  Neurological: She is alert and oriented to person, place, and time.  Skin: Skin is warm and dry.  Psychiatric: Her speech is normal and behavior is normal. Judgment and thought content normal. Her mood appears anxious (Rated #3). Her affect is not angry, not blunt, not labile and not inappropriate. Cognition and memory are normal. She does not exhibit a depressed mood.    Review of Systems  Constitutional: Negative.   HENT: Negative.   Eyes: Negative.   Respiratory: Negative.   Cardiovascular: Negative.   Gastrointestinal: Negative.   Genitourinary: Negative.   Musculoskeletal: Negative.   Skin: Negative.   Neurological: Negative.   Endo/Heme/Allergies: Negative.   Psychiatric/Behavioral: Negative for depression, suicidal ideas, hallucinations, memory loss and substance abuse. The patient is nervous/anxious (rated #3). The patient does not have insomnia.     Blood pressure 132/77, pulse 97, temperature 98.2 F (36.8 C), temperature source Oral, resp. rate 16, height '5\' 7"'  (1.702 m), weight 117.028 kg (258 lb), last menstrual period 10/04/2013, SpO2 100.00%, currently breastfeeding.Body mass index is 40.4 kg/(m^2).  General Appearance: Fairly Groomed  Engineer, water::  Fair  Speech:  Clear and Coherent  Volume:  Normal  Mood:  Anxious and denies feeling or being depressed  Affect:  Restricted  Thought Process:  Coherent and Intact  Orientation:  Full (Time, Place, and Person)  Thought Content:  Rumination  Suicidal Thoughts:  No, but present on admission at the ED  Homicidal Thoughts:  No  Memory:  Immediate;   Good Recent;  Fair Remote;   Fair  Judgement:  Fair  Insight:  Present and but shallow  Psychomotor Activity:  Normal  Concentration:  Fair  Recall:  [employer] of Knowledge:Fair  Language: Good  Akathisia:  No  Handed:  Right  AIMS (if indicated):     Assets:  Communication Skills Desire for Improvement Physical Health   Sleep:  Number of Hours: 4.75   Musculoskeletal: Strength & Muscle Tone: within normal limits Gait & Station: normal Patient leans: N/A  Past Psychiatric History: Diagnosis: Bipolar I disorder, most recent episode (or current) depressed, unspecified  Hospitalizations: ([facility] about 3 months ago", [facility] adult  [facility] Care: [facility]  Substance Abuse Care: Denies  Self-Mutilation: Denies  Suicidal Attempts: Denies attempts, admits thoughts  Violent Behaviors: Denies   Past Medical History:   Past Medical History  Diagnosis Date  . Anxiety   . Mental disorder   . Depression   . Cognitive deficits   . Bipolar 1 disorder    None.  Allergies:   Allergies  Allergen Reactions  . Penicillins Hives    Face breaks out.   PTA Medications: Prescriptions prior to admission  Medication Sig Dispense Refill  . FLUoxetine (PROZAC) 40 MG capsule Take 40 mg by mouth daily.      . paliperidone (INVEGA) 6 MG 24 hr tablet Take 6 mg by mouth daily.      . Paliperidone Palmitate (INVEGA SUSTENNA IM) Inject 1 each into the muscle every 30 (thirty) days. Not sure of dose. Patient gets [facility] at [facility]       Previous Psychotropic Medications: Medication/Dose  Invega injectable (last dose about 1 month ago per patient)  Invega tablets, Prozac             Substance Abuse History in the last 12 months:  No.  Consequences of Substance Abuse: Medical Consequences:  Liver damage, Possible death by overdose Legal Consequences:  Arrests, jail time, Loss of driving privilege. Family Consequences:  Family discord, divorce and or separation.  Social History:  reports that she quit smoking about 2 years ago. She has never used smokeless tobacco. She reports that she does not drink alcohol or use illicit drugs. Additional Social History: Pain Medications: pt denies abuse Prescriptions: pt denies abuse Over the Counter: pt denies abuse History of alcohol / drug use?: No history of alcohol  / drug abuse Longest period of sobriety (when/how long): N/A Current Place of Residence: [place], [place] of Birth: [place], [place]   Family Members: "Family"  Marital Status:  Single  Children: 3  Sons: 0  Daughters: 3  Relationships: Single  Education:  Completed 11th grade  Educational Problems/Performance: Did not complete high school  Religious Beliefs/Practices: NA  History of Abuse (Emotional/Phsycial/Sexual): Denies  Occupational Experiences: Disabled  Military History:  None.  Legal History: Denies any pending legal charges.  Hobbies/Interests: Listen to music  Family History:   Family History  Problem Relation Age of Onset  . Hypertension Mother   . Diabetes Father     Results for orders placed during the hospital encounter of 12/04/13 (from the past 72 hour(s))  ACETAMINOPHEN LEVEL     Status: None   Collection Time    12/04/13  6:10 PM      Result Value Ref Range   Acetaminophen (Tylenol), Serum <15.0  10 - 30 ug/mL   Comment:            THERAPEUTIC CONCENTRATIONS VARY  SIGNIFICANTLY. A RANGE OF 10-30     ug/mL MAY BE AN EFFECTIVE     CONCENTRATION FOR MANY PATIENTS.     HOWEVER, SOME ARE BEST TREATED     AT CONCENTRATIONS OUTSIDE THIS     RANGE.     ACETAMINOPHEN CONCENTRATIONS     >150 ug/mL AT 4 HOURS AFTER     INGESTION AND >50 ug/mL AT 12     HOURS AFTER INGESTION ARE     OFTEN ASSOCIATED WITH TOXIC     REACTIONS.  CBC     Status: None   Collection Time    12/04/13  6:10 PM      Result Value Ref Range   WBC 7.1  4.0 - 10.5 K/uL   RBC 4.95  3.87 - 5.11 MIL/uL   Hemoglobin 13.1  12.0 - 15.0 g/dL   HCT 39.7  36.0 - 46.0 %   MCV 80.2  78.0 - 100.0 fL   MCH 26.5  26.0 - 34.0 pg   MCHC 33.0  30.0 - 36.0 g/dL   RDW 13.9  11.5 - 15.5 %   Platelets 222  150 - 400 K/uL  COMPREHENSIVE METABOLIC PANEL     Status: None   Collection Time    12/04/13  6:10 PM      Result Value Ref Range   Sodium 138  137 - 147 mEq/L   Potassium  3.9  3.7 - 5.3 mEq/L   Chloride 102  96 - 112 mEq/L   CO2 23  19 - 32 mEq/L   Glucose, Bld 90  70 - 99 mg/dL   BUN 10  6 - 23 mg/dL   Creatinine, Ser 0.65  0.50 - 1.10 mg/dL   Calcium 9.6  8.4 - 10.5 mg/dL   Total Protein 7.8  6.0 - 8.3 g/dL   Albumin 3.9  3.5 - 5.2 g/dL   AST 26  0 - 37 U/L   ALT 26  0 - 35 U/L   Alkaline Phosphatase 66  39 - 117 U/L   Total Bilirubin 0.3  0.3 - 1.2 mg/dL   GFR calc non Af Amer >90  >90 mL/min   GFR calc Af Amer >90  >90 mL/min   Comment: (NOTE)     The eGFR has been calculated using the CKD EPI equation.     This calculation has not been validated in all clinical situations.     eGFR's persistently <90 mL/min signify possible Chronic Kidney     Disease.   Anion gap 13  5 - 15  ETHANOL     Status: None   Collection Time    12/04/13  6:10 PM      Result Value Ref Range   Alcohol, Ethyl (B) <11  0 - 11 mg/dL   Comment:            LOWEST DETECTABLE LIMIT FOR     SERUM ALCOHOL IS 11 mg/dL     FOR MEDICAL PURPOSES ONLY  SALICYLATE LEVEL     Status: Abnormal   Collection Time    12/04/13  6:10 PM      Result Value Ref Range   Salicylate Lvl <1.8 (*) 2.8 - 20.0 mg/dL  URINE RAPID DRUG SCREEN (HOSP PERFORMED)     Status: None   Collection Time    12/04/13  7:16 PM      Result Value Ref Range   Opiates NONE DETECTED  NONE DETECTED   Cocaine NONE DETECTED  NONE DETECTED   Benzodiazepines NONE DETECTED  NONE DETECTED   Amphetamines NONE DETECTED  NONE DETECTED   Tetrahydrocannabinol NONE DETECTED  NONE DETECTED   Barbiturates NONE DETECTED  NONE DETECTED   Comment:            DRUG SCREEN FOR MEDICAL PURPOSES     ONLY.  IF CONFIRMATION IS NEEDED     FOR ANY PURPOSE, NOTIFY LAB     WITHIN 5 DAYS.                LOWEST DETECTABLE LIMITS     FOR URINE DRUG SCREEN     Drug Class       Cutoff (ng/mL)     Amphetamine      1000     Barbiturate      200     Benzodiazepine   644     Tricyclics       034     Opiates          300     Cocaine           300     THC              50  POC URINE PREG, ED     Status: None   Collection Time    12/04/13  7:21 PM      Result Value Ref Range   Preg Test, Ur NEGATIVE  NEGATIVE   Comment:            THE SENSITIVITY OF THIS     METHODOLOGY IS >24 mIU/mL   Psychological Evaluations:  Assessment:   DSM5: Schizophrenia Disorders:  NA Obsessive-Compulsive Disorders:  NA Trauma-Stressor Disorders:  NA Substance/Addictive Disorders:  NA Depressive Disorders:  Bipolar I disorder, most recent episode (or current) depressed, unspecified   AXIS I:  Bipolar I disorder, most recent episode (or current) depressed, unspecified AXIS II:  Borderline IQ AXIS III:   Past Medical History  Diagnosis Date  . Anxiety   . Mental disorder   . Depression   . Cognitive deficits   . Bipolar 1 disorder    AXIS IV:  educational problems, occupational problems, other psychosocial or environmental problems and mental illness, chronic AXIS V:  41-50 serious symptoms   Treatment Plan/Recommendations: 1. Admit for crisis management and stabilization, estimated length of stay 3-5 days.  2. Medication management to reduce current symptoms to base line and improve the patient's overall level of functioning; continue current plan of care in progress.  3. Treat health problems as indicated.  4. Develop treatment plan to decrease risk of relapse upon discharge and the need for readmission.  5. Psycho-social education regarding relapse prevention and self care.  6. Health care follow up as needed for medical problems.  7. Review, reconcile, and reinstate any pertinent home medications for other health issues where appropriate. 8. Call for consults with hospitalist for any additional specialty patient care services as needed.  Treatment Plan Summary: Daily contact with patient to assess and evaluate symptoms and progress in treatment Medication management  Current Medications:  Current Facility-Administered  Medications  Medication Dose Route Frequency Provider Last Rate Last Dose  . acetaminophen (TYLENOL) tablet 650 mg  650 mg Oral Q6H PRN Laverle Hobby, PA-C      . alum & mag hydroxide-simeth (MAALOX/MYLANTA) 200-200-20 MG/5ML suspension 30 mL  30 mL Oral Q4H PRN Laverle Hobby, PA-C      . benztropine (COGENTIN) tablet  0.5 mg  0.5 mg Oral Daily PRN Encarnacion Slates, NP      . buPROPion (WELLBUTRIN XL) 24 hr tablet 150 mg  150 mg Oral Daily Nicholaus Bloom, MD   150 mg at 12/05/13 1206  . haloperidol (HALDOL) tablet 5 mg  5 mg Oral Q6H PRN Laverle Hobby, PA-C      . hydrOXYzine (ATARAX/VISTARIL) tablet 25 mg  25 mg Oral Q6H PRN Laverle Hobby, PA-C      . magnesium hydroxide (MILK OF MAGNESIA) suspension 30 mL  30 mL Oral Daily PRN Laverle Hobby, PA-C      . paliperidone (INVEGA) 24 hr tablet 6 mg  6 mg Oral QHS Nicholaus Bloom, MD      . traZODone (DESYREL) tablet 50 mg  50 mg Oral QHS,MR X 1 Laverle Hobby, PA-C        Observation Level/Precautions:  15 minute checks  Laboratory:  Per ED  Psychotherapy: Group counseling sessions  Medications:  See medication lists  Consultations: As needed  Discharge Concerns: Mood stability & safety  Estimated LOS: 3-5 days  Other:     I certify that inpatient services furnished can reasonably be expected to improve the patient's condition.   Encarnacion Slates, [place], [place] 8/11/201512:38 PM  I personally assessed the patient, reviewed the physical exam and labs and formulated the treatment plan Geralyn Flash A. Sabra Heck, M.D.

## 2013-12-05 NOTE — Progress Notes (Signed)
Recreation Therapy Notes  Animal-Assisted Activity/Therapy (AAA/T) Program Checklist/Progress Notes Patient Eligibility Criteria Checklist & Daily Group note for Rec Tx Intervention  Date: 08.11.2015 Time: 2:45pm Location: [address]   AAA/T Program Assumption of Risk Form signed by Patient/ or Parent Legal Guardian yes  Patient is free of allergies or sever asthma yes  Patient reports no fear of animals yes  Patient reports no history of cruelty to animals yes   Patient understands his/her participation is voluntary yes  Patient washes hands before animal contact yes  Patient washes hands after animal contact yes  Behavioral Response: Engaged, Appropriate   Education: Hand Washing, Appropriate Animal Interaction   Education Outcome: Acknowledges understanding  Clinical Observations/Feedback: Patient interacted appropriately with therapeutic dog team and peers in session.   Laureen Ochs Marice Angelino, LRT/CTRS        Katianne Barre L 12/05/2013 5:05 PM

## 2013-12-05 NOTE — [facility] Suicide Risk Assessment (Signed)
Suicide Risk Assessment  Admission Assessment     Nursing information obtained from:  Patient Demographic factors:  Adolescent or young adult;Low socioeconomic status;Unemployed;Living alone Current Mental Status:  Suicidal ideation indicated by patient;Self-harm thoughts;Self-harm behaviors Loss Factors:    Historical Factors:  Prior suicide attempts Risk Reduction Factors:  Positive social support;Religious beliefs about death;Positive coping skills or problem solving skills Total Time spent with patient: 45 minutes  CLINICAL FACTORS:   Bipolar Disorder:   Depressive phase  Psychiatric Specialty Exam:     Blood pressure 132/77, pulse 97, temperature 98.2 F (36.8 C), temperature source Oral, resp. rate 16, height 5\' 7"  (1.702 m), weight 117.028 kg (258 lb), last menstrual period 10/04/2013, SpO2 100.00%, currently breastfeeding.Body mass index is 40.4 kg/(m^2).  General Appearance: Fairly Groomed  Engineer, water::  Fair  Speech:  Clear and Coherent and Slow, not spontaneous  Volume:  Decreased  Mood:  Anxious and Depressed  Affect:  Restricted  Thought Process:  Coherent and Goal Directed  Orientation:  Full (Time, Place, and Person)  Thought Content:  events, symptoms worries concerns  Suicidal Thoughts:  No  Homicidal Thoughts:  No  Memory:  Immediate;   Fair Recent;   Fair Remote;   Fair  Judgement:  Fair  Insight:  Present  Psychomotor Activity:  Decrease  Concentration:  Fair  Recall:  [employer] of Knowledge:NA  Language: Fair  Akathisia:  No  Handed:    AIMS (if indicated):     Assets:  Desire for Improvement  Sleep:  Number of Hours: 4.75   Musculoskeletal: Strength & Muscle Tone: within normal limits Gait & Station: normal Patient leans: N/A  COGNITIVE FEATURES THAT CONTRIBUTE TO RISK:  Closed-mindedness Polarized thinking Thought constriction (tunnel vision)    SUICIDE RISK:   Mild:  Suicidal ideation of limited frequency, intensity, duration, and  specificity.  There are no identifiable plans, no associated intent, mild dysphoria and related symptoms, good self-control (both objective and subjective assessment), few other risk factors, and identifiable protective factors, including available and accessible social support.  PLAN OF CARE: Supportive approach/coping skills/relapse prevention                              Resume the Invega                              Will start Wellbutrin medication that she thinks worked better than the Prozac  I certify that inpatient services furnished can reasonably be expected to improve the patient's condition.  Trevian Hayashida A 12/05/2013, 6:14 PM

## 2013-12-05 NOTE — Progress Notes (Signed)
[facility] Group Notes:  (Nursing/MHT/Case Management/Adjunct)  Date:  12/05/2013  Time:  9:40 PM  Type of Therapy:  Group Therapy  Participation Level:  Minimal  Participation Quality:  Attentive  Affect:  Appropriate  Cognitive:  Alert  Insight:  Good  Engagement in Group:  Engaged  Modes of Intervention:  Discussion  Summary of Progress/Problems: Patient stated that she had a good day today. She says that the thing she does to help with recovery and take care of herself is attend all groups and exercise.  [facility] 12/05/2013, 9:40 PM

## 2013-12-05 NOTE — Progress Notes (Signed)
23 y/o female female who presents voluntarily for depression and suicidal ideations.  Patient states she started having suicidal thoughts with a plan to overdose.  Patient states she had to give her children up for adoption and states the last one was 7 months ago and states this continues to bother her.  Patient states she birthed 3 children and has custody of none.  Patient states she has also been noncompliant with her medications for the last two weeks.  Patient currently states she also has an ACT team but states they will soon be closing.  Patient states she is worried about that.  Patient currently states she is passive SI but verbally contracts for safety.  Patient denies HI and denies AVH.  Patient skin assessed and patient has not skin issues.  Patient belongings secured in locker #14.  Food and fluids offered and patient accepted both.  Consents obtained, fall safety plan explained and patient verbalized understanding.  Patient escorted and oriented to the unit.  Patient offered no additional questions or concerns.

## 2013-12-05 NOTE — Progress Notes (Signed)
D: Patient mood and affect depressed. Verbal interaction minimal with staff and other patients. On self-assessment, rated depression as 2. Indicated that goal for today is to "work on depression" and "attend groups." Patient stated that she "wants to get back on her Wellbutrin." She confirmed that prior to admission, she had been off her medications for at least 2 weeks.  A: Support provided through active listening. Advised Dr. Sabra Heck in treatment team meeting of patient's request to resume Wellbutrin; medication ordered by MD and administered. Encouraged active group participation.   R: Patient denies SI at present; verbally agrees to seek out staff if feeling unsafe. Patient has attended groups this morning. Patient expresses verbal understanding of indications for Wellbutrin.

## 2013-12-05 NOTE — [facility] Group Notes (Signed)
[place] [place] [facility] LCSW Group Therapy      Feelings About Diagnosis 1:15 - 2:30 PM         12/05/2013    Type of Therapy:  Group Therapy  Participation Level:  Active  Participation Quality:  Appropriate  Affect:  Appropriate  Cognitive:  Alert and Appropriate  Insight:  Developing/Improving and Engaged  Engagement in Therapy:  Developing/Improving and Engaged  Modes of Intervention:  Discussion, Education, Exploration, Problem-Solving, Rapport Building, Support  Summary of Progress/Problems:  Patient actively participated in group. Patient discussed past and present diagnosis and the effects it has had on  life.  Patient talked about family and society being judgmental and the stigma associated with having a mental health diagnosis. She shared she feels anger and sad that she is depressed and often feels bad.  Concha Pyo 12/05/2013

## 2013-12-05 NOTE — Progress Notes (Signed)
D: Pt denies SI/HI/AVH. Pt is pleasant and cooperative. Pt was pleasant  Still appears a little limited, but is appropriate.   A: Pt was offered support and encouragement. Pt was given scheduled medications. Pt was encourage to attend groups. Q 15 minute checks were done for safety.   R:Pt attends groups and interacts well with peers and staff. Pt is taking medication. Pt has no complaints at this time .Pt receptive to treatment and safety maintained on unit.

## 2013-12-05 NOTE — BHH Counselor (Signed)
Adult Comprehensive Assessment  Patient ID: [name], female   DOB: 1990-07-08, 23 y.o.   MRN: [id number]  Information Source: Information source: Patient  Current Stressors:  Educational / Learning stressors: None Employment / Job issues: Patient is diaabled Family Relationships: none Museum/gallery curator / Lack of resources (include bankruptcy): None Housing / Lack of housing: None Physical health (include injuries & life threatening diseases): None Social relationships: None Substance abuse: None  Bereavement / Loss: None  Living/Environment/Situation:  Living Arrangements: Alone Living conditions (as described by patient or guardian): Good How long has patient lived in current situation?: Two years What is atmosphere in current home: Comfortable  Family History:  Marital status: Single Does patient have children?: Yes How many children?: 3 How is patient's relationship with their children?: Patient reported having a good relationship with children but admission note stated she does not have custody of children  Childhood History:  By whom was/is the patient raised?: Mother Additional childhood history information: Good childhood Description of patient's relationship with caregiver when they were a child: Good relationship with mother Patient's description of current relationship with people who raised him/her: Good relationship Does patient have siblings?: Yes Number of Siblings: 2 Description of patient's current relationship with siblings: Patient reports she gets along okay with sisters Did patient suffer any verbal/emotional/physical/sexual abuse as a child?: No Did patient suffer from severe childhood neglect?: No Has patient ever been sexually abused/assaulted/raped as an adolescent or adult?: No Was the patient ever a victim of a crime or a disaster?: No Witnessed domestic violence?: No Has patient been effected by domestic violence as an adult?: No  Education:   Highest grade of school patient has completed: 21 (pt states she took "special classes") Currently a student?: No Name of school: [employer] Learning disability?: No  Employment/Work Situation:   Employment situation: On disability 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 How long has patient been on disability: Age 14 years Patient's job has been impacted by current illness: No What is the longest time patient has a held a job?: Patient has never worked Where was the patient employed at that time?: N/A Has patient ever been in the [employer]?: No Has patient ever served in Recruitment consultant?: No  Financial Resources:   Museum/gallery curator resources: Armed forces training and education officer Does patient have a Programmer, applications or guardian?: No  Alcohol/Substance Abuse:   What has been your use of drugs/alcohol within the last 12 months?: Patient denies If attempted suicide, did drugs/alcohol play a role in this?: No Alcohol/Substance Abuse Treatment Hx: Denies past history Has alcohol/substance abuse ever caused legal problems?: No  Social Support System:   Heritage manager System: None Describe Community Support System: N/A   Type of faith/religion: Darrick Meigs How does patient's faith help to cope with current illness?: Pray/read the Bible  Leisure/Recreation:   Leisure and Hobbies: Movies  Strengths/Needs:   What things does the patient do well?: Cooking In what areas does patient struggle / problems for patient: Learning  Discharge Plan:   Does patient have access to transportation?: Yes Will patient be returning to same living situation after discharge?: Yes Currently receiving community mental health services: No If no, would patient like referral for services when discharged?: Yes (What county?) ([place]) Does patient have financial barriers related to discharge medications?: No  Summary/Recommendations:  Tian Davison is a 23 years old female admitted with Major  Depression Disorder.  She will benefit from crisis stabilization, evaluation for medication, psycho-education groups  for coping skills development, group therapy and case management for discharge planning.     Ayinde Swim, Eulas Post. 12/05/2013

## 2013-12-05 NOTE — Progress Notes (Signed)
The focus of this group is to educate the patient on the purpose and policies of crisis stabilization and provide a format to answer questions about their admission.  The group details unit policies and expectations of patients while admitted.  Patient attended 0900 nurse education orientation group this morning.  Patient actively participated, appropriate affect, alert, appropriate insight and engagement.  Today patient will work on 3 goals for discharge.  

## 2013-12-05 NOTE — Tx Team (Signed)
Interdisciplinary Treatment Plan Update   Date Reviewed:  12/05/2013  Time Reviewed:  9:27 AM  Progress in Treatment:   Attending groups: Yes Participating in groups: Yes Taking medication as prescribed: Yes  Tolerating medication: Yes Family/Significant other contact made:  No, but will ask patient for consent for collateral contact Patient understands diagnosis: Yes  Discussing patient identified problems/goals with staff: Yes Medical problems stabilized or resolved: Yes Denies suicidal/homicidal ideation: Yes Patient has not harmed self or others: Yes  For review of initial/current patient goals, please see plan of care.  Estimated Length of Stay:  3-5 days  Reasons for Continued Hospitalization:  Anxiety Depression Medication stabilization Suicidal ideation New Problems/Goals identified:    Discharge Plan or Barriers:   Home with outpatient follow up to be determined  Additional Comments: [name] is an 23 y.o. female. Pt presents voluntarily as walk in to [facility]. She reports she called [facility] who brought her to [facility]. Pt endorses SI with plan to overdose on meds. Pt reports 3 prior suicide attempts. Pt is oriented x 4 and is cooperative. Pt is soft spoken and her affect is depressed. Pt sts she had to give up her 35 mo old baby three mos ago "for adoption". Per chart review, pt was inpatient at [facility] in June 2014, 3 x 2012 and 3 x 2006 for SI and depression. Pt sts she was d/c from [facility] for SI a few mos ago. Pt endorses fatigue, guilt, loss of interest in usual pleasures, worthlessness, hopelessness, and tearfulness. Pt sts her mother is supportive. Pt sts she lives alone. Pt denies HI. She denies [facility] and no delusions noted. Pt says, "I'm ready to end my life." Pt reports she used to be a patient with [facility]'s ACT Team, but pt sts she thinks the agency has closed. Pt says she hasn't had her psych meds in three weeks d/t the ACT Team closing.     Attendees:   Patient:  12/05/2013 9:27 AM   Signature:  Gypsy Balsam, MD 12/05/2013 9:27 AM  Signature:  12/05/2013 9:27 AM  Signature: Phillis Knack, RN 12/05/2013 9:27 AM  Signature:Beverly Danelle Earthly, RN 12/05/2013 9:27 AM  Signature:  Thurnell Garbe RN 12/05/2013 9:27 AM  Signature:  Joette Catching, LCSW 12/05/2013 9:27 AM  Signature:   12/05/2013 9:27 AM  Signature:  Lucinda Dell, Care Coordinator [facility] 12/05/2013 9:27 AM  Signature:  12/05/2013 9:27 AM  Signature:  12/05/2013  9:27 AM  Signature:   Lars Pinks, RN [facility] 12/05/2013  9:27 AM  Signature: 12/05/2013  9:27 AM    Scribe for Treatment Team:   Joette Catching,  12/05/2013 9:27 AM

## 2013-12-06 DIAGNOSIS — F411 Generalized anxiety disorder: Secondary | ICD-10-CM

## 2013-12-06 MED ORDER — PALIPERIDONE PALMITATE 117 MG/0.75ML IM SUSP
117.0000 mg | INTRAMUSCULAR | Status: DC
  Administered 2013-12-07: 117 mg via INTRAMUSCULAR
  Filled 2013-12-06 (×2): qty 0.75

## 2013-12-06 NOTE — Progress Notes (Signed)
D: Patient appropriate and cooperative with staff. Patient has flat, sad and depressed affect and mood. Declined self inventory sheet today. She's participating in groups, but little to no interaction with peers in the dayroom. Patient is compliant medication regimen.  A: Support and encouragement provided to patient. Scheduled medication administered per MD orders. Maintain Q15 minute checks for safety.  R: Patient receptive. Denies SI/HI and AVH. Patient remains safe.

## 2013-12-06 NOTE — BHH Group Notes (Signed)
[place] LCSW Group Therapy  Emotional Regulation 1:15 - 2: 30 PM        12/06/2013     Type of Therapy:  Group Therapy  Participation Level:  Appropriate  Participation Quality:  Appropriate  Affect:  Appropriate  Cognitive:  Attentive Appropriate  Insight:  Developing/Improving Engaged  Engagement in Therapy:  Developing/Improving Engaged  Modes of Intervention:  Discussion Exploration Problem-Solving Supportive  Summary of Progress/Problems:  Group topic was emotional regulations.  Patient participated in the discussion and was able to identify an emotion that needed to regulated.  Patient shared she sometimes gets angry but has learned to walk away and not give in to her feeling.    Concha Pyo 12/06/2013

## 2013-12-06 NOTE — BHH Suicide Risk Assessment (Signed)
[facility] INPATIENT:  Family/Significant Other Suicide Prevention Education  Suicide Prevention Education:  Education Completed; Alexas Basulto, Mother, [phone]; has been identified by the patient as the family member/significant other with whom the patient will be residing, and identified as the person(s) who will aid the patient in the event of a mental health crisis (suicidal ideations/suicide attempt).  With written consent from the patient, the family member/significant other has been provided the following suicide prevention education, prior to the and/or following the discharge of the patient.  The suicide prevention education provided includes the following:  Suicide risk factors  Suicide prevention and interventions  National Suicide Hotline telephone number  [facility] assessment telephone number  [facility] Emergency Assistance [place] and/or Residential Mobile Crisis Unit telephone number  Request made of family/significant other to:  Remove weapons (e.g., guns, rifles, knives), all items previously/currently identified as safety concern.  Mother advised patient does not have access to weapons.    Remove drugs/medications (over-the-counter, prescriptions, illicit drugs), all items previously/currently identified as a safety concern.  The family member/significant other verbalizes understanding of the suicide prevention education information provided.  The family member/significant other agrees to remove the items of safety concern listed above.  Concha Pyo 12/06/2013, 8:28 AM

## 2013-12-06 NOTE — Progress Notes (Signed)
[facility] MD Progress Note  12/06/2013 4:00 PM [name]  MRN:  [id number] Subjective:  States she is starting to feel better. She is going to be seen by the [facility] ACT. She wants to be back on her medications as states she knows they work for her. She is wanting to be more active involved as she feels worst when she is bored Diagnosis:   DSM5: Depressive Disorders:  Major Depressive Disorder - Moderate (296.22) Total Time spent with patient: 30 minutes  Axis I: Bipolar, Depressed and Generalized Anxiety Disorder  ADL's:  Intact  Sleep: Fair  Appetite:  Fair  SuPsychiatric Specialty Exam: Physical Exam  Review of Systems  Constitutional: Negative.   HENT: Negative.   Eyes: Negative.   Respiratory: Negative.   Cardiovascular: Negative.   Gastrointestinal: Negative.   Genitourinary: Negative.   Musculoskeletal: Negative.   Skin: Negative.   Neurological: Negative.   Endo/Heme/Allergies: Negative.   Psychiatric/Behavioral: Positive for depression. The patient is nervous/anxious.     Blood pressure 117/57, pulse 95, temperature 98.7 F (37.1 C), temperature source Oral, resp. rate 20, height '5\' 7"'  (1.702 m), weight 117.028 kg (258 lb), last menstrual period 10/04/2013, SpO2 100.00%, currently breastfeeding.Body mass index is 40.4 kg/(m^2).  General Appearance: Fairly Groomed  Engineer, water::  Fair  Speech:  Clear and Coherent and Slow  Volume:  Decreased  Mood:  Anxious and worried  Affect:  Restricted  Thought Process:  Coherent and Goal Directed  Orientation:  Full (Time, Place, and Person)  Thought Content:  sympoms worries concerns  Suicidal Thoughts:  No  Homicidal Thoughts:  No  Memory:  Immediate;   Fair Recent;   Fair Remote;   Fair  Judgement:  Fair  Insight:  Present  Psychomotor Activity:  Decreased  Concentration:  Fair  Recall:  [employer] of Knowledge:NA  Language: Fair  Akathisia:  No  Handed:    AIMS (if indicated):     Assets:  Desire for  Improvement Housing Social Support  Sleep:  Number of Hours: 6.75   Musculoskeletal: Strength & Muscle Tone: within normal limits Gait & Station: normal Patient leans: N/A  Current Medications: Current Facility-Administered Medications  Medication Dose Route Frequency Provider Last Rate Last Dose  . acetaminophen (TYLENOL) tablet 650 mg  650 mg Oral Q6H PRN Laverle Hobby, PA-C      . alum & mag hydroxide-simeth (MAALOX/MYLANTA) 200-200-20 MG/5ML suspension 30 mL  30 mL Oral Q4H PRN Laverle Hobby, PA-C      . benztropine (COGENTIN) tablet 0.5 mg  0.5 mg Oral Daily PRN Encarnacion Slates, NP      . buPROPion (WELLBUTRIN XL) 24 hr tablet 150 mg  150 mg Oral Daily Nicholaus Bloom, MD   150 mg at 12/06/13 5797  . haloperidol (HALDOL) tablet 5 mg  5 mg Oral Q6H PRN Laverle Hobby, PA-C      . hydrOXYzine (ATARAX/VISTARIL) tablet 25 mg  25 mg Oral Q6H PRN Laverle Hobby, PA-C      . magnesium hydroxide (MILK OF MAGNESIA) suspension 30 mL  30 mL Oral Daily PRN Laverle Hobby, PA-C      . paliperidone (INVEGA) 24 hr tablet 6 mg  6 mg Oral QHS Nicholaus Bloom, MD   6 mg at 12/05/13 2201  . [START ON 12/07/2013] Paliperidone Palmitate SUSP 117 mg  117 mg Intramuscular Q28 days Nicholaus Bloom, MD      . traZODone (DESYREL) tablet 50  mg  50 mg Oral QHS,MR X 1 Laverle Hobby, PA-C   50 mg at 12/05/13 2249    Lab Results:  Results for orders placed during the hospital encounter of 12/04/13 (from the past 48 hour(s))  ACETAMINOPHEN LEVEL     Status: None   Collection Time    12/04/13  6:10 PM      Result Value Ref Range   Acetaminophen (Tylenol), Serum <15.0  10 - 30 ug/mL   Comment:            THERAPEUTIC CONCENTRATIONS VARY     SIGNIFICANTLY. A RANGE OF 10-30     ug/mL MAY BE AN EFFECTIVE     CONCENTRATION FOR MANY PATIENTS.     HOWEVER, SOME ARE BEST TREATED     AT CONCENTRATIONS OUTSIDE THIS     RANGE.     ACETAMINOPHEN CONCENTRATIONS     >150 ug/mL AT 4 HOURS AFTER     INGESTION AND >50  ug/mL AT 12     HOURS AFTER INGESTION ARE     OFTEN ASSOCIATED WITH TOXIC     REACTIONS.  CBC     Status: None   Collection Time    12/04/13  6:10 PM      Result Value Ref Range   WBC 7.1  4.0 - 10.5 K/uL   RBC 4.95  3.87 - 5.11 MIL/uL   Hemoglobin 13.1  12.0 - 15.0 g/dL   HCT 39.7  36.0 - 46.0 %   MCV 80.2  78.0 - 100.0 fL   MCH 26.5  26.0 - 34.0 pg   MCHC 33.0  30.0 - 36.0 g/dL   RDW 13.9  11.5 - 15.5 %   Platelets 222  150 - 400 K/uL  COMPREHENSIVE METABOLIC PANEL     Status: None   Collection Time    12/04/13  6:10 PM      Result Value Ref Range   Sodium 138  137 - 147 mEq/L   Potassium 3.9  3.7 - 5.3 mEq/L   Chloride 102  96 - 112 mEq/L   CO2 23  19 - 32 mEq/L   Glucose, Bld 90  70 - 99 mg/dL   BUN 10  6 - 23 mg/dL   Creatinine, Ser 0.65  0.50 - 1.10 mg/dL   Calcium 9.6  8.4 - 10.5 mg/dL   Total Protein 7.8  6.0 - 8.3 g/dL   Albumin 3.9  3.5 - 5.2 g/dL   AST 26  0 - 37 U/L   ALT 26  0 - 35 U/L   Alkaline Phosphatase 66  39 - 117 U/L   Total Bilirubin 0.3  0.3 - 1.2 mg/dL   GFR calc non Af Amer >90  >90 mL/min   GFR calc Af Amer >90  >90 mL/min   Comment: (NOTE)     The eGFR has been calculated using the CKD EPI equation.     This calculation has not been validated in all clinical situations.     eGFR's persistently <90 mL/min signify possible Chronic Kidney     Disease.   Anion gap 13  5 - 15  ETHANOL     Status: None   Collection Time    12/04/13  6:10 PM      Result Value Ref Range   Alcohol, Ethyl (B) <11  0 - 11 mg/dL   Comment:            LOWEST DETECTABLE LIMIT FOR  SERUM ALCOHOL IS 11 mg/dL     FOR MEDICAL PURPOSES ONLY  SALICYLATE LEVEL     Status: Abnormal   Collection Time    12/04/13  6:10 PM      Result Value Ref Range   Salicylate Lvl <8.5 (*) 2.8 - 20.0 mg/dL  URINE RAPID DRUG SCREEN (HOSP PERFORMED)     Status: None   Collection Time    12/04/13  7:16 PM      Result Value Ref Range   Opiates NONE DETECTED  NONE DETECTED   Cocaine  NONE DETECTED  NONE DETECTED   Benzodiazepines NONE DETECTED  NONE DETECTED   Amphetamines NONE DETECTED  NONE DETECTED   Tetrahydrocannabinol NONE DETECTED  NONE DETECTED   Barbiturates NONE DETECTED  NONE DETECTED   Comment:            DRUG SCREEN FOR MEDICAL PURPOSES     ONLY.  IF CONFIRMATION IS NEEDED     FOR ANY PURPOSE, NOTIFY LAB     WITHIN 5 DAYS.                LOWEST DETECTABLE LIMITS     FOR URINE DRUG SCREEN     Drug Class       Cutoff (ng/mL)     Amphetamine      1000     Barbiturate      200     Benzodiazepine   462     Tricyclics       703     Opiates          300     Cocaine          300     THC              50  POC URINE PREG, ED     Status: None   Collection Time    12/04/13  7:21 PM      Result Value Ref Range   Preg Test, Ur NEGATIVE  NEGATIVE   Comment:            THE SENSITIVITY OF THIS     METHODOLOGY IS >24 mIU/mL    Physical Findings: AIMS: Facial and Oral Movements Muscles of Facial Expression: None, normal Lips and Perioral Area: None, normal Jaw: None, normal Tongue: None, normal,Extremity Movements Upper (arms, wrists, hands, fingers): None, normal Lower (legs, knees, ankles, toes): None, normal, Trunk Movements Neck, shoulders, hips: None, normal, Overall Severity Severity of abnormal movements (highest score from questions above): None, normal Incapacitation due to abnormal movements: None, normal Patient's awareness of abnormal movements (rate only patient's report): No Awareness, Dental Status Current problems with teeth and/or dentures?: No Does patient usually wear dentures?: No  CIWA:    COWS:     Treatment Plan Summary: Daily contact with patient to assess and evaluate symptoms and progress in treatment Medication management  Plan: Supportive approach/coping skills           Resume the Invega IM          Work on life style management changes  Medical Decision Making Problem Points:  Review of psycho-social stressors  (1) Data Points:  Review of medication regiment & side effects (2) Review of new medications or change in dosage (2)  I certify that inpatient services furnished can reasonably be expected to improve the patient's condition.   Kerie Badger A 12/06/2013, 4:00 PM

## 2013-12-06 NOTE — Progress Notes (Signed)
Adult Psychoeducational Group Note  Date:  12/06/2013 Time:  9:43 PM  Group Topic/Focus:  Wrap-Up Group:   The focus of this group is to help patients review their daily goal of treatment and discuss progress on daily workbooks.  Participation Level:  Active  Participation Quality:  Appropriate  Affect:  Appropriate  Cognitive:  Appropriate  Insight: Appropriate  Engagement in Group:  Engaged  Modes of Intervention:  Education  Additional Comments:  Patient rated today a 10 out of 10 stating it was a good day. Patient stated one positive thing about today was that she enjoyed all the groups she went to and also enjoyed the good food here at [facility].  Oralia Manis 12/06/2013, 9:43 PM

## 2013-12-06 NOTE — BHH Group Notes (Signed)
[facility] LCSW Aftercare Discharge Planning Group Note   12/06/2013 11:10 AM    Participation Quality:  Appropraite  Mood/Affect:  Appropriate  Depression Rating:  1  Anxiety Rating:  1  Thoughts of Suicide:  No  Will you contract for safety?   NA  Current AVH:  No  Plan for Discharge/Comments:  Patient attended discharge planning group and actively participated in group.  Patient will return to her home and will follow up with [facility] for outpatient medication management.  CSW provided all participants with daily workbook.   Transportation Means: Patient has transportation.   Supports:  Patient has a support system.   Sereen Schaff, Eulas Post

## 2013-12-06 NOTE — Progress Notes (Signed)
D: Patient in bed on approach.  Patient states she has been in bed today but states she did attend some groups. Patient states she does feel a little better because she has gotten out of the house.  Patient states she gets bored at home.  Patient states she met her goal today which was to follow up with was to call [facility].  Patient denies SI/HI and denies AVH. A: Staff to monitor Q 15 mins for safety.  Encouragement and support offered.  Scheduled medications administered per orders.   R: Patient remains safe on the unit.  Patient attended group tonight.  Patient visible on the unit.  Patient taking administered medications.

## 2013-12-07 NOTE — Progress Notes (Signed)
Adult Psychoeducational Group Note  Date:  12/07/2013 Time:  9:00 AM  Group Topic/Focus:  Morning Wellness Group  Participation Level:  Minimal  Participation Quality:  Appropriate  Affect:  Flat  Cognitive:  Alert and Oriented  Insight: Improving  Engagement in Group:  Engaged  Modes of Intervention:  Discussion  Additional Comments: Patient's goal today is to attend all groups.  Kathlen Brunswick 12/07/2013, 6:39 PM

## 2013-12-07 NOTE — Progress Notes (Signed)
Adult Psychoeducational Group Note  Date:  12/07/2013 Time:  10:04 PM  Group Topic/Focus:  Wrap-Up Group:   The focus of this group is to help patients review their daily goal of treatment and discuss progress on daily workbooks.  Participation Level:  Did Not Attend   [name] 12/07/2013, 10:04 PM

## 2013-12-07 NOTE — Progress Notes (Addendum)
D: Pt presents with a flat affect and sad mood. Pt's affect brightens upon interaction. Pt is currently denying any SI/HI/AVH. Pt is denying any concerns she wishes for this to writer to address at this time. Pt was able to verbalize her QHS medication regimen. Pt is currently present in the dayroom with no interaction with others.  A: Writer administered scheduled medications to pt, per MD orders. Continued support and availability as needed was extended to this pt. Staff continue to monitor pt with q74min checks.  R: No adverse drug reactions noted. Pt receptive to treatment. Pt remains safe at this time.

## 2013-12-07 NOTE — Progress Notes (Signed)
D: Patient continues to have flat, sad affect and depressed mood. She reported on the self inventory sheet that sleep, appetite and ability to concentrate are all good and energy level is normal. Patient rates depression, feelings of hopelessness and anxiety "0". She's attending groups today and visible in the milieu, but still no interaction with peers on the hall. Adheres to current medication regimen.  A: Support and encouragement provided to patient. Administered scheduled medications per ordering MD. Monitor Q15 minute checks for safety.   R: Patient receptive. Denies SI/HI. Patient remains safe on the unit.

## 2013-12-07 NOTE — ED Provider Notes (Signed)
Medical screening examination/treatment/procedure(s) were performed by non-physician practitioner and as supervising physician I was immediately available for consultation/collaboration.   EKG Interpretation None       Virgel Manifold, MD 12/07/13 [phone]

## 2013-12-07 NOTE — BHH Group Notes (Signed)
[place] LCSW Group Therapy  [facility] 1:15 - 2:30 PM  12/07/2013  2:21 PM   Type of Therapy:  Group Therapy  Participation Level: Minimal  Participation Quality:Limited  Affect:  Appropriate  Cognitive:  Appropriate  Insight:  Developing/Improving   Engagement in Therapy:  Developing/Improving   Modes of Intervention:  Discussion, Education, Exploration, Problem-Solving, Rapport Building, Support   Summary of Progress/Problems: Patient listened attentively to the speaker but make no comments on the presentation.      Concha Pyo 12/07/2013 2:21 PM

## 2013-12-07 NOTE — Progress Notes (Signed)
Adult Psychoeducational Group Note  Date:  12/07/2013 Time:  7:13 PM  Group Topic/Focus:  Overcoming Stress:   The focus of this group is to define stress and help patients assess their triggers.  Participation Level:  Active  Participation Quality:  Appropriate, Attentive and Sharing  Affect:  Appropriate  Cognitive:  Alert and Appropriate  Insight: Appropriate and Good  Engagement in Group:  Engaged  Modes of Intervention:  Activity and Problem-solving  Additional Comments:  Pt. Attended group; and participated well.  Sharol Harness 12/07/2013, 7:13 PM

## 2013-12-07 NOTE — Progress Notes (Signed)
[facility] MD Progress Note  12/07/2013 4:36 PM [name]  MRN:  [id number] Subjective:  [name] got the Invega IM dose today. She will be followed up by the [facility]. She states she is doing better. She states that she does well if she stays on her medications. The last providers closed their doors and there was a gap in terms of follow up. She also states that doing nothing, being bored does not help. She hopes to be more active when she goes back home Diagnosis:   DSM5: Depressive Disorders:  Major Depressive Disorder - Moderate (296.22) Total Time spent with patient: 30 minutes  Axis I: Bipolar, Depressed and Generalized Anxiety Disorder  ADL's:  Intact  Sleep: Fair  Appetite:  Fair Psychiatric Specialty Exam: Physical Exam  Review of Systems  Constitutional: Negative.   HENT: Negative.   Eyes: Negative.   Respiratory: Negative.   Cardiovascular: Negative.   Gastrointestinal: Negative.   Genitourinary: Negative.   Musculoskeletal: Negative.   Skin: Negative.   Neurological: Negative.   Endo/Heme/Allergies: Negative.   Psychiatric/Behavioral: Positive for depression. The patient is nervous/anxious.     Blood pressure 109/52, pulse 82, temperature 96.6 F (35.9 C), temperature source Oral, resp. rate 18, height 5\' 7"  (1.702 m), weight 117.028 kg (258 lb), last menstrual period 10/04/2013, SpO2 100.00%, currently breastfeeding.Body mass index is 40.4 kg/(m^2).  General Appearance: Fairly Groomed  Engineer, water::  Fair  Speech:  Clear and Coherent  Volume:  Normal  Mood:  Anxious and Depressed  Affect:  Restricted  Thought Process:  Coherent and Goal Directed  Orientation:  Full (Time, Place, and Person)  Thought Content:  symptoms worries concerns plans as she moves on  Suicidal Thoughts:  No  Homicidal Thoughts:  No  Memory:  Immediate;   Fair Recent;   Fair Remote;   Fair  Judgement:  Fair  Insight:  Present  Psychomotor Activity:  Normal  Concentration:  Fair   Recall:  [employer] of Knowledge:NA  Language: Fair  Akathisia:  No  Handed:    AIMS (if indicated):     Assets:  Desire for Improvement Housing Social Support  Sleep:  Number of Hours: 6.75   Musculoskeletal: Strength & Muscle Tone: within normal limits Gait & Station: normal Patient leans: N/A  Current Medications: Current Facility-Administered Medications  Medication Dose Route Frequency Provider Last Rate Last Dose  . acetaminophen (TYLENOL) tablet 650 mg  650 mg Oral Q6H PRN Laverle Hobby, PA-C      . alum & mag hydroxide-simeth (MAALOX/MYLANTA) 200-200-20 MG/5ML suspension 30 mL  30 mL Oral Q4H PRN Laverle Hobby, PA-C      . benztropine (COGENTIN) tablet 0.5 mg  0.5 mg Oral Daily PRN Encarnacion Slates, NP      . buPROPion (WELLBUTRIN XL) 24 hr tablet 150 mg  150 mg Oral Daily Nicholaus Bloom, MD   150 mg at 12/07/13 3212  . haloperidol (HALDOL) tablet 5 mg  5 mg Oral Q6H PRN Laverle Hobby, PA-C      . hydrOXYzine (ATARAX/VISTARIL) tablet 25 mg  25 mg Oral Q6H PRN Laverle Hobby, PA-C      . magnesium hydroxide (MILK OF MAGNESIA) suspension 30 mL  30 mL Oral Daily PRN Laverle Hobby, PA-C      . paliperidone (INVEGA) 24 hr tablet 6 mg  6 mg Oral QHS Nicholaus Bloom, MD   6 mg at 12/06/13 2115  . Paliperidone Palmitate SUSP  117 mg  117 mg Intramuscular Q28 days Nicholaus Bloom, MD   117 mg at 12/07/13 1158  . traZODone (DESYREL) tablet 50 mg  50 mg Oral QHS,MR X 1 Laverle Hobby, PA-C   50 mg at 12/06/13 2115    Lab Results: No results found for this or any previous visit (from the past 48 hour(s)).  Physical Findings: AIMS: Facial and Oral Movements Muscles of Facial Expression: None, normal Lips and Perioral Area: None, normal Jaw: None, normal Tongue: None, normal,Extremity Movements Upper (arms, wrists, hands, fingers): None, normal Lower (legs, knees, ankles, toes): None, normal, Trunk Movements Neck, shoulders, hips: None, normal, Overall Severity Severity of  abnormal movements (highest score from questions above): None, normal Incapacitation due to abnormal movements: None, normal Patient's awareness of abnormal movements (rate only patient's report): No Awareness, Dental Status Current problems with teeth and/or dentures?: No Does patient usually wear dentures?: No  CIWA:    COWS:     Treatment Plan Summary: Daily contact with patient to assess and evaluate symptoms and progress in treatment Medication management  Plan: Supportive approach/coping skills           CBT/mindfulness           Life style changes  Medical Decision Making Problem Points:  Review of psycho-social stressors (1) Data Points:  Review of medication regiment & side effects (2)  I certify that inpatient services furnished can reasonably be expected to improve the patient's condition.   Ary Rudnick A 12/07/2013, 4:36 PM

## 2013-12-08 MED ORDER — BENZTROPINE MESYLATE 0.5 MG PO TABS: 0.5000 mg | ORAL_TABLET | Freq: Every day | ORAL | Status: DC | PRN

## 2013-12-08 MED ORDER — HYDROXYZINE HCL 25 MG PO TABS
25.0000 mg | ORAL_TABLET | Freq: Three times a day (TID) | ORAL | Status: DC | PRN
  Filled 2013-12-08: qty 30

## 2013-12-08 MED ORDER — TRAZODONE HCL 50 MG PO TABS: 50.0000 mg | ORAL_TABLET | Freq: Every evening | ORAL | Status: DC | PRN

## 2013-12-08 MED ORDER — PALIPERIDONE PALMITATE 117 MG/0.75ML IM SUSP: 117.0000 mg | INTRAMUSCULAR | Status: DC

## 2013-12-08 MED ORDER — PALIPERIDONE ER 6 MG PO TB24: 6.0000 mg | ORAL_TABLET | Freq: Every day | ORAL | Status: DC

## 2013-12-08 MED ORDER — BUPROPION HCL ER (XL) 150 MG PO TB24
150.0000 mg | ORAL_TABLET | Freq: Every day | ORAL | Status: DC
Start: 2013-12-08 — End: 2014-01-16

## 2013-12-08 MED ORDER — HYDROXYZINE HCL 25 MG PO TABS: ORAL_TABLET | ORAL | Status: DC

## 2013-12-08 NOTE — Progress Notes (Signed)
Patient ID: [name], female   DOB: [birth year], 23 y.o.   MRN: [id number] Had wanted to stay for lunch before being discharged and still waiting on medications from the pharmacy. Discharged now when back on unit from lunch.Escorted to lobby for discharge and given all her property and medications and prescriptions.

## 2013-12-08 NOTE — Discharge Summary (Signed)
Physician Discharge Summary Note  Patient:  [name] is an 23 y.o., female MRN:  [id number] DOB:  1990/10/19 Patient phone:  [phone] (home)  Patient address:   [place] [address],  Total Time spent with patient: Greater than 30 minutes  Date of Admission:  12/04/2013 Date of Discharge: 12/08/13  Reason for Admission: Mood stabilization  Discharge Diagnoses: Active Problems:   Bipolar I disorder, most recent episode (or current) depressed, unspecified   Mood disorder   Psychiatric Specialty Exam: Physical Exam  Psychiatric: Her speech is normal and behavior is normal. Judgment and thought content normal. Her mood appears not anxious. Her affect is not angry, not blunt, not labile and not inappropriate. Cognition and memory are normal. She does not exhibit a depressed mood.    Review of Systems  Constitutional: Negative.   HENT: Negative.   Eyes: Negative.   Respiratory: Negative.   Cardiovascular: Negative.   Gastrointestinal: Negative.   Genitourinary: Negative.   Musculoskeletal: Negative.   Skin: Negative.   Neurological: Negative.   Endo/Heme/Allergies: Negative.   Psychiatric/Behavioral: Positive for depression (Stable). Negative for suicidal ideas, hallucinations, memory loss and substance abuse. The patient has insomnia (Stable). The patient is not nervous/anxious.     Blood pressure 114/72, pulse 110, temperature 98.5 F (36.9 C), temperature source Oral, resp. rate 20, height 5\' 7"  (1.702 m), weight 117.028 kg (258 lb), last menstrual period 10/04/2013, SpO2 100.00%, currently breastfeeding.Body mass index is 40.4 kg/(m^2).   General Appearance: Fairly Groomed   Engineer, water:: Fair   Speech: Clear and Coherent   Volume: Normal   Mood: Euthymic   Affect: Restricted   Thought Process: Coherent and Goal Directed   Orientation: Full (Time, Place, and Person)   Thought Content: plans as she moves on   Suicidal Thoughts: No    Homicidal Thoughts: No   Memory: Immediate; Fair  Recent; Fair  Remote; Fair   Judgement: Fair   Insight: Present   Psychomotor Activity: Normal   Concentration: Fair   Recall: [employer] of Knowledge:NA   Language: Fair   Akathisia: No   Handed:   AIMS (if indicated):   Assets: Desire for Improvement  Housing  Social Support   Sleep: Number of Hours: 6.75    Past Psychiatric History: Diagnosis: Bipolar I disorder, most recent episode (or current) depressed, unspecified  Hospitalizations: [facility] adult unit  Outpatient Care: [facility]  Substance Abuse Care: NA  Self-Mutilation: NA  Suicidal Attempts: NA  Violent Behaviors: NA   Musculoskeletal: Strength & Muscle Tone: within normal limits Gait & Station: normal Patient leans: N/A  DSM5: Schizophrenia Disorders:  NA Obsessive-Compulsive Disorders:  NA Trauma-Stressor Disorders:  NA Substance/Addictive Disorders:  NA Depressive Disorders:  Bipolar I disorder, most recent episode (or current) depressed, unspecified  Axis Diagnosis:  AXIS I:  Bipolar I disorder, most recent episode (or current) depressed, unspecified AXIS II:  Borderline IQ AXIS III:   Past Medical History  Diagnosis Date  . Anxiety   . Mental disorder   . Depression   . Cognitive deficits   . 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 She reports, "I went to the [facility] last night. I had suicidal thoughts that  lasted for about 4 hours. I got bored doing nothing. It got to a point where I could no longer take it. I don't work. I need a job, something to do during the day to pass my times.   [name] received medication management for mood stabilization while a patient in this hospital. She was medicated  and discharged on Invega 6 mg for mood control, Invega injectable 117 mg IM last administered on 12/07/13, due to be given again (01/07/14), Wellbutrin XL 150 mg for depression and Trazodone 50 mg for insomnia and Cogentin 0.5 mg for EPS. She was also enrolled in and participated in the group counseling sessions being offered and held on this unit. She learned coping skills. [name] presented no other medical complaints that required treatment. She tolerated her treatment regimen without any adverse effects and or reactions.  [name]'s symptoms responded well to her treatment regimen. This is evidenced by her reports of improved mood and absence of suicidal ideations. She is being discharged to continue treatment at the [facility] clinic here in [place], [place]. She is provided with all the necessary information required to make this appointment without problems. Upon discharge, she adamantly denies any SIHI, AVH, delusional thoughts and or paranoia. She received a 4 days worth, supply samples of her [facility] discharge medications. She left [facility] with all belongings in no distress. Patient arranged on transportation.  Consults:  psychiatry  Significant Diagnostic Studies:  labs: CBC with diff, CMP, UDS, toxicology tests, U/A  Discharge Vitals:   Blood pressure 114/72, pulse 110, temperature 98.5 F (36.9 C), temperature source Oral, resp. rate 20, height 5\' 7"  (1.702 m), weight 117.028 kg (258 lb), last menstrual period 10/04/2013, SpO2 100.00%, currently breastfeeding. Body mass index is 40.4 kg/(m^2). Lab Results:   No results found for this or any previous visit (from the past 72 hour(s)).  Physical Findings: AIMS: Facial and Oral Movements Muscles of Facial Expression: None, normal Lips and Perioral Area: None, normal Jaw: None, normal Tongue: None, normal,Extremity Movements Upper (arms, wrists, hands, fingers): None, normal Lower (legs, knees, ankles, toes): None, normal, Trunk Movements Neck,  shoulders, hips: None, normal, Overall Severity Severity of abnormal movements (highest score from questions above): None, normal Incapacitation due to abnormal movements: None, normal Patient's awareness of abnormal movements (rate only patient's report): No Awareness, Dental Status Current problems with teeth and/or dentures?: No Does patient usually wear dentures?: No  CIWA:    COWS:     Psychiatric Specialty Exam: See Psychiatric Specialty Exam and Suicide Risk Assessment completed by Attending Physician prior to discharge.  Discharge destination:  Home  Is patient on multiple antipsychotic therapies at discharge:  No   Has Patient had three or more failed trials of antipsychotic monotherapy by history:  No  Recommended Plan for Multiple Antipsychotic Therapies: NA    Medication List    STOP taking these medications       FLUoxetine 40 MG capsule  Commonly known as:  PROZAC      TAKE these medications     Indication   benztropine 0.5 MG tablet  Commonly known as:  COGENTIN  Take 1 tablet (0.5 mg total) by mouth daily as needed for tremors.   Indication:  Extrapyramidal Reaction caused by Medications     buPROPion 150 MG 24 hr tablet  Commonly known as:  WELLBUTRIN XL  Take 1 tablet (150 mg total) by mouth daily. For depression   Indication:  Major Depressive Disorder     hydrOXYzine 25 MG  tablet  Commonly known as:  ATARAX/VISTARIL  Take 1 tablet (25 mg) three times daily as needed: For anxiety   Indication:  Tension, Anxiety     paliperidone 6 MG 24 hr tablet  Commonly known as:  INVEGA  Take 1 tablet (6 mg total) by mouth at bedtime. For mood control   Indication:  Mood control     Paliperidone Palmitate 117 MG/0.75ML Susp  Inject 117 mg into the muscle every 28 (twenty-eight) days. (Due on 01/07/14): For mood control   Indication:  Mood control     traZODone 50 MG tablet  Commonly known as:  DESYREL  Take 1 tablet (50 mg total) by mouth at bedtime and  may repeat dose one time if needed. For sleep   Indication:  Trouble Sleeping       Follow-up Information   Follow up with [facility] On 12/11/2013. (Please go to [facility]'s walk in clinic on Monday, December 11, 2013 or any weekday for medication management/counseling)    Contact information:   [address] [place], [place]   [address]  [phone]     Follow-up recommendations: Activity:  As tolerated Diet: As recommended by your primary care doctor. Keep all scheduled follow-up appointments as recommended.   Comments:  Take all your medications as prescribed by your mental healthcare provider. Report any adverse effects and or reactions from your medicines to your outpatient provider promptly. Patient is instructed and cautioned to not engage in alcohol and or illegal drug use while on prescription medicines. In the event of worsening symptoms, patient is instructed to call the crisis hotline, 911 and or go to the nearest ED for appropriate evaluation and treatment of symptoms. Follow-up with your primary care provider for your other medical issues, concerns and or health care needs.   Total Discharge Time:  Greater than 30 minutes.  Signed: Encarnacion Slates, [place] 12/08/2013, 9:34 AM I personally assessed the patient and formulated the plan Geralyn Flash A. Sabra Heck, M.D.

## 2013-12-08 NOTE — Tx Team (Signed)
Interdisciplinary Treatment Plan Update   Date Reviewed:  12/08/2013  Time Reviewed:  8:32 AM  Progress in Treatment:   Attending groups: Yes Participating in groups: Yes Taking medication as prescribed: Yes  Tolerating medication: Yes Family/Significant other contact made:  Yes, collateral contact made with mother. Patient understands diagnosis: Yes  Discussing patient identified problems/goals with staff: Yes Medical problems stabilized or resolved: Yes Denies suicidal/homicidal ideation: Yes Patient has not harmed self or others: Yes  For review of initial/current patient goals, please see plan of care.  Estimated Length of Stay:  Discharge today  Reasons for Continued Hospitalization:   New Problems/Goals identified:    Discharge Plan or Barriers:   Home with outpatient follow up with [facility]  Additional Comments:    Attendees:  Patient:  12/08/2013 8:32 AM   Signature:    Agustina Caroli, NP 12/08/2013 8:32 AM  Signature:   Edwyna Shell, NP 12/08/2013 8:32 AM  Signature: Drake Leach, RN 12/08/2013 8:32 AM  Signature:  Patrecia Pace, RN 12/08/2013 8:32 AM  Signature:  Eduard Roux, RN 12/08/2013 8:32 AM  Signature:  Joette Catching, LCSW 12/08/2013 8:32 AM  Signature:  Edwyna Shell, Lead LCSW 12/08/2013 8:32 AM  Signature:  Lucinda Dell, Care Coordinator [facility] 12/08/2013 8:32 AM  Signature:  Cyril Mourning Drinkard, LCSW-A 12/08/2013 8:32 AM  Signature:  Maxie Better, LCSW-A 12/08/2013  8:32 AM  Signature:    12/08/2013  8:32 AM  Signature: 12/08/2013  8:32 AM    Scribe for Treatment Team:   Joette Catching,  12/08/2013 8:32 AM

## 2013-12-08 NOTE — BHH Group Notes (Signed)
[facility] LCSW Aftercare Discharge Planning Group Note   12/08/2013 10:07 AM    Participation Quality:  Appropraite  Mood/Affect:  Appropriate  Depression Rating:  0  Anxiety Rating:  0  Thoughts of Suicide:  No  Will you contract for safety?   NA  Current AVH:  No  Plan for Discharge/Comments:  Patient attended discharge planning group and actively participated in group.  She reports doing well and being ready to discharge home.  She will follow up with [facility].  CSW provided all participants with daily workbook.   Transportation Means: Patient has transportation.   Supports:  Patient has a support system.   [name], Eulas Post

## 2013-12-08 NOTE — Progress Notes (Signed)
Patient ID: [name], female   DOB: [birth year], 23 y.o.   MRN: [id number] Discharge Note-Dr has determined she is able to discharge today as she is not endorsing any thoughts to hurt self or others and is not psychotic. She has a full affect today.Her plan is to return to her home and to get home via a bus which she has money to take.Her follow up is with [facility]. Dr.gave her RX's and 14 days of medications in addition. All property returned to her.Reviewed with her her discharge plans and she expressed her understanding.

## 2013-12-08 NOTE — Progress Notes (Signed)
[facility] Adult Case Management Discharge Plan :  Will you be returning to the same living situation after discharge: Yes,  Patient is returning to her home. At discharge, do you have transportation home?:Yes,  Patient is arranging transportation. Do you have the ability to pay for your medications:Yes,  Patient has Medicaid.  Release of information consent forms completed and in the chart;  Patient's signature needed at discharge.  Patient to Follow up at: Follow-up Information   Follow up with [facility] On 12/11/2013. (Please go to [facility]'s walk in clinic on Monday, December 11, 2013 or any weekday for medication management/counseling)    Contact information:   [address] [place], [place]   [address]  [phone]      Patient denies SI/HI: Patient no longer endorsing SI/HI or other thoughts of self harm.    Safety Planning and Suicide Prevention discussed: .Reviewed with all patients during discharge planning group   Despina Boan, Eulas Post 12/08/2013, 10:04 AM

## 2013-12-08 NOTE — BHH Suicide Risk Assessment (Signed)
Suicide Risk Assessment  Discharge Assessment     Demographic Factors:  Adolescent or young adult  Total Time spent with patient: 30 minutes  Psychiatric Specialty Exam:     Blood pressure 114/72, pulse 110, temperature 98.5 F (36.9 C), temperature source Oral, resp. rate 20, height 5\' 7"  (1.702 m), weight 117.028 kg (258 lb), last menstrual period 10/04/2013, SpO2 100.00%, currently breastfeeding.Body mass index is 40.4 kg/(m^2).  General Appearance: Fairly Groomed  Engineer, water::  Fair  Speech:  Clear and Coherent  Volume:  Normal  Mood:  Euthymic  Affect:  Restricted  Thought Process:  Coherent and Goal Directed  Orientation:  Full (Time, Place, and Person)  Thought Content:  plans as she moves on  Suicidal Thoughts:  No  Homicidal Thoughts:  No  Memory:  Immediate;   Fair Recent;   Fair Remote;   Fair  Judgement:  Fair  Insight:  Present  Psychomotor Activity:  Normal  Concentration:  Fair  Recall:  [employer] of Knowledge:NA  Language: Fair  Akathisia:  No  Handed:    AIMS (if indicated):     Assets:  Desire for Improvement Housing Social Support  Sleep:  Number of Hours: 6.75    Musculoskeletal: Strength & Muscle Tone: within normal limits Gait & Station: normal Patient leans: N/A   Mental Status Per Nursing Assessment::   On Admission:  Suicidal ideation indicated by patient;Self-harm thoughts;Self-harm behaviors  Current Mental Status by Physician: In full contact with reality. Committed to work with the [facility], comply with her medications   Loss Factors: NA  Historical Factors: NA  Risk Reduction Factors:   Positive social support  Continued Clinical Symptoms:  Bipolar Disorder:   Depressive phase  Cognitive Features That Contribute To Risk:  Closed-mindedness Polarized thinking Thought constriction (tunnel vision)    Suicide Risk:  Minimal: No identifiable suicidal ideation.  Patients presenting with no risk factors but with morbid  ruminations; may be classified as minimal risk based on the severity of the depressive symptoms  Discharge Diagnoses:   AXIS I:  Bipolar Disorder, depressed AXIS II:  No diagnosis AXIS III:   Past Medical History  Diagnosis Date  . Anxiety   . Mental disorder   . Depression   . Cognitive deficits   . Bipolar 1 disorder    AXIS IV:  other psychosocial or environmental problems AXIS V:  61-70 mild symptoms  Plan Of Care/Follow-up recommendations:  Activity:  as tolerated Diet:  regular Follow up [facility] Is patient on multiple antipsychotic therapies at discharge:  No   Has Patient had three or more failed trials of antipsychotic monotherapy by history:  No  Recommended Plan for Multiple Antipsychotic Therapies: NA    [name] A 12/08/2013, 9:12 AM

## 2013-12-08 NOTE — BHH Group Notes (Signed)
[facility] LCSW Group Therapy  Feelings Around Relapse 1:15 -2:30        12/08/2013 2:32 PM   Type of Therapy:  Group Therapy  Participation Level:  Appropriate  Participation Quality:  Appropriate  Affect:  Depressed, Flat  Cognitive:  Attentive Appropriate  Insight:  Developing/Improving  Engagement in Therapy: Developing/Improving  Modes of Intervention:  Discussion Exploration Problem-Solving Supportive  Summary of Progress/Problems:  The topic for today was feelings around relapse.    Patient processed feelings toward relapse and was able to relate to peers. Patient shared she would be staying in bed sleeping all day.  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   Concha Pyo 12/08/2013 2:32 PM

## 2013-12-13 NOTE — Progress Notes (Signed)
Patient Discharge Instructions:  After Visit Summary (AVS):   Faxed to:  12/13/13 Discharge Summary Note:   Faxed to:  12/13/13 Psychiatric Admission Assessment Note:   Faxed to:  12/13/13 Suicide Risk Assessment - Discharge Assessment:   Faxed to:  12/13/13 Faxed/Sent to the Next Level Care provider:  12/13/13 Faxed to [facility] [place], 12/13/2013, 3:44 PM

## 2014-01-16 ENCOUNTER — Encounter (HOSPITAL_COMMUNITY): Payer: Self-pay | Admitting: Emergency Medicine

## 2014-01-16 ENCOUNTER — Emergency Department (HOSPITAL_COMMUNITY)
Admission: EM | Admit: 2014-01-16 | Discharge: 2014-01-16 | Disposition: A | Discharge status: Home / Self Care | Payer: Medicaid Other | Attending: Emergency Medicine | Admitting: Emergency Medicine

## 2014-01-16 DIAGNOSIS — Z8659 Personal history of other mental and behavioral disorders: Secondary | ICD-10-CM | POA: Insufficient documentation

## 2014-01-16 DIAGNOSIS — Z880 Allergy status to penicillin: Secondary | ICD-10-CM | POA: Diagnosis not present

## 2014-01-16 DIAGNOSIS — Z87891 Personal history of nicotine dependence: Secondary | ICD-10-CM | POA: Diagnosis not present

## 2014-01-16 DIAGNOSIS — N921 Excessive and frequent menstruation with irregular cycle: Secondary | ICD-10-CM

## 2014-01-16 DIAGNOSIS — N920 Excessive and frequent menstruation with regular cycle: Secondary | ICD-10-CM | POA: Diagnosis not present

## 2014-01-16 DIAGNOSIS — R109 Unspecified abdominal pain: Secondary | ICD-10-CM | POA: Insufficient documentation

## 2014-01-16 DIAGNOSIS — Z3202 Encounter for pregnancy test, result negative: Secondary | ICD-10-CM | POA: Insufficient documentation

## 2014-01-16 LAB — COMPREHENSIVE METABOLIC PANEL
ALT: 28 U/L (ref 0–35)
AST: 30 U/L (ref 0–37)
Albumin: 4 g/dL (ref 3.5–5.2)
Alkaline Phosphatase: 74 U/L (ref 39–117)
Anion gap: 13 (ref 5–15)
BILIRUBIN TOTAL: 0.4 mg/dL (ref 0.3–1.2)
BUN: 10 mg/dL (ref 6–23)
CALCIUM: 9.5 mg/dL (ref 8.4–10.5)
CHLORIDE: 105 meq/L (ref 96–112)
CO2: 22 mEq/L (ref 19–32)
Creatinine, Ser: 0.69 mg/dL (ref 0.50–1.10)
GFR calc Af Amer: >90 mL/min (ref >90)
GFR calc non Af Amer: >90 mL/min (ref >90)
Glucose, Bld: 113 mg/dL — ABNORMAL HIGH (ref 70–99)
Potassium: 3.9 mEq/L (ref 3.7–5.3)
SODIUM: 140 meq/L (ref 137–147)
Total Protein: 7.8 g/dL (ref 6.0–8.3)

## 2014-01-16 LAB — CBC WITH DIFFERENTIAL/PLATELET
BASOS ABS: 0.1 10*3/uL (ref 0.0–0.1)
Basophils Relative: 1 % (ref 0–1)
Eosinophils Absolute: 0.3 10*3/uL (ref 0.0–0.7)
Eosinophils Relative: 4 % (ref 0–5)
HCT: 39.9 % (ref 36.0–46.0)
Hemoglobin: 13.3 g/dL (ref 12.0–15.0)
Lymphocytes Relative: 26 % (ref 12–46)
Lymphs Abs: 2.2 10*3/uL (ref 0.7–4.0)
MCH: 27.4 pg (ref 26.0–34.0)
MCHC: 33.3 g/dL (ref 30.0–36.0)
MCV: 82.1 fL (ref 78.0–100.0)
Monocytes Absolute: 0.5 10*3/uL (ref 0.1–1.0)
Monocytes Relative: 7 % (ref 3–12)
NEUTROS ABS: 5.3 10*3/uL (ref 1.7–7.7)
NEUTROS PCT: 62 % (ref 43–77)
PLATELETS: 204 10*3/uL (ref 150–400)
RBC: 4.86 MIL/uL (ref 3.87–5.11)
RDW: 14.1 % (ref 11.5–15.5)
WBC: 8.4 10*3/uL (ref 4.0–10.5)

## 2014-01-16 LAB — URINALYSIS, ROUTINE W REFLEX MICROSCOPIC
Bilirubin Urine: NEGATIVE
GLUCOSE, UA: NEGATIVE mg/dL
Hgb urine dipstick: NEGATIVE
KETONES UR: NEGATIVE mg/dL
LEUKOCYTES UA: NEGATIVE
Nitrite: NEGATIVE
PH: 6 (ref 5.0–8.0)
Protein, ur: NEGATIVE mg/dL
Specific Gravity, Urine: 1.031 — ABNORMAL HIGH (ref 1.005–1.030)
Urobilinogen, UA: 0.2 mg/dL (ref 0.0–1.0)

## 2014-01-16 LAB — WET PREP, GENITAL
Clue Cells Wet Prep HPF POC: NONE SEEN
Trich, Wet Prep: NONE SEEN
YEAST WET PREP: NONE SEEN

## 2014-01-16 LAB — POC URINE PREG, ED: Preg Test, Ur: NEGATIVE

## 2014-01-16 NOTE — ED Provider Notes (Signed)
CSN: [id number]     Arrival date & time 01/16/14  1120 History   First MD Initiated Contact with Patient 01/16/14 1215     Chief Complaint  Patient presents with  . Abdominal Pain  . Menorrhagia     (Consider location/radiation/quality/duration/timing/severity/associated sxs/prior Treatment) Patient is a 23 y.o. female presenting with abdominal pain. The history is provided by the patient.  Abdominal Pain Pain location:  Suprapubic Pain quality: aching   Pain radiates to:  Does not radiate Pain severity:  Mild Onset quality:  Gradual Timing:  Intermittent Progression:  Unchanged Chronicity:  New Context: not awakening from sleep, not diet changes, not previous surgeries and not recent illness   Relieved by:  Nothing Associated symptoms: no chills, no cough, no fever and no shortness of breath     Past Medical History  Diagnosis Date  . Anxiety   . Mental disorder   . Depression   . Cognitive deficits   . Bipolar 1 disorder    Past Surgical History  Procedure Laterality Date  . Cesarean section    . Tonsillectomy N/A 06/03/2012    Procedure: TONSILLECTOMY;  Surgeon: [name], MD;  Location: [facility];  Service: ENT;  Laterality: N/A;  . Mass excision N/A 06/03/2012    Procedure: EXCISION MASS;  Surgeon: [name], MD;  Location: [facility];  Service: ENT;  Laterality: N/A;  Excision uvula mass  . Cesarean section N/A 04/25/2013    Procedure: REPEAT CESAREAN SECTION;  Surgeon: [name], MD;  Location: [facility];  Service: Obstetrics;  Laterality: N/A;   Family History  Problem Relation Age of Onset  . Hypertension Mother   . Diabetes Father    History  Substance Use Topics  . Smoking status: Former Smoker -- 7 years    Quit date: 09/30/2011  . Smokeless tobacco: Never Used  . Alcohol Use: No     Comment: Patient denies    OB History   Grav Para Term Preterm Abortions TAB SAB Ect Mult Living   3 3 3  0 0 0 0 0 0 3      Review of Systems  Constitutional: Negative for fever and chills.  Respiratory: Negative for cough and shortness of breath.   Gastrointestinal: Positive for abdominal pain.  All other systems reviewed and are negative.     Allergies  Penicillins  Home Medications   Prior to Admission medications   Not on File   BP 131/89  Pulse 104  Temp(Src) 98.2 F (36.8 C) (Oral)  Resp 16  SpO2 95%  LMP 01/02/2014 Physical Exam  Nursing note and vitals reviewed. Constitutional: She is oriented to person, place, and time. She appears well-developed and well-nourished. No distress.  HENT:  Head: Normocephalic and atraumatic.  Mouth/Throat: Oropharynx is clear and moist.  Eyes: EOM are normal. Pupils are equal, round, and reactive to light.  Neck: Normal range of motion. Neck supple.  Cardiovascular: Normal rate and regular rhythm.  Exam reveals no friction rub.   No murmur heard. Pulmonary/Chest: Effort normal and breath sounds normal. No respiratory distress. She has no wheezes. She has no rales.  Abdominal: Soft. She exhibits no distension. There is no tenderness. There is no rebound.  Genitourinary: Cervix exhibits no motion tenderness and no discharge. Right adnexum displays no mass, no tenderness and no fullness. Left adnexum displays no mass, no tenderness and no fullness.  Musculoskeletal: Normal range of motion. She exhibits no edema.  Neurological: She is alert  and oriented to person, place, and time.  Skin: She is not diaphoretic.    ED Course  Procedures (including critical care time) Labs Review Labs Reviewed  WET PREP, GENITAL - Abnormal; Notable for the following:    WBC, Wet Prep HPF POC FEW (*)    All other components within normal limits  COMPREHENSIVE METABOLIC PANEL - Abnormal; Notable for the following:    Glucose, Bld 113 (*)    All other components within normal limits  URINALYSIS, ROUTINE W REFLEX MICROSCOPIC - Abnormal; Notable for the following:     Color, Urine AMBER (*)    APPearance CLOUDY (*)    Specific Gravity, Urine 1.031 (*)    All other components within normal limits  GC/CHLAMYDIA PROBE AMP  CBC WITH DIFFERENTIAL  POC URINE PREG, ED    Imaging Review No results found.   EKG Interpretation None      MDM   Final diagnoses:  Menorrhagia with irregular cycle    23 year old female here with abdominal pain and heavy menstrual cycles. She wants to know if she has tumors in her abdomen. She is concerned about that because a family member that is not related to her has had abdominal tumors. Last menstrual period 2 weeks ago. Periods are usually lasting 3 weeks with blood clots. Air consistently irregular. She stable vitals here. She has mild suprapubic tenderness on exam pelvic exam is normal. Labs normal. Instructed patient to followup with PCP in OB/GYN for further workup. Do not feel she warrants a CT scan today as she has no peritoneal signs and very very mild superpubic pain.    Evelina Bucy, MD 01/16/14 1550

## 2014-01-16 NOTE — Discharge Instructions (Signed)
Menorrhagia  Menorrhagia is the medical term for when your menstrual periods are heavy or last longer than usual. With menorrhagia, every period you have may cause enough blood loss and cramping that you are unable to maintain your usual activities.  CAUSES   In some cases, the cause of heavy periods is unknown, but a number of conditions may cause menorrhagia. Common causes include:   A problem with the hormone-producing thyroid gland (hypothyroid).   Noncancerous growths in the uterus (polyps or fibroids).   An imbalance of the estrogen and progesterone hormones.   One of your ovaries not releasing an egg during one or more months.   Side effects of having an intrauterine device (IUD).   Side effects of some medicines, such as anti-inflammatory medicines or blood thinners.   A bleeding disorder that stops your blood from clotting normally.  SIGNS AND SYMPTOMS   During a normal period, bleeding lasts between 4 and 8 days. Signs that your periods are too heavy include:   You routinely have to change your pad or tampon every 1 or 2 hours because it is completely soaked.   You pass blood clots larger than 1 inch (2.5 cm) in size.   You have bleeding for more than 7 days.   You need to use pads and tampons at the same time because of heavy bleeding.   You need to wake up to change your pads or tampons during the night.   You have symptoms of anemia, such as tiredness, fatigue, or shortness of breath.  DIAGNOSIS   Your health care provider will perform a physical exam and ask you questions about your symptoms and menstrual history. Other tests may be ordered based on what the health care provider finds during the exam. These tests can include:   Blood tests. Blood tests are used to check if you are pregnant or have hormonal changes, a bleeding or thyroid disorder, low iron levels (anemia), or other problems.   Endometrial biopsy. Your health care provider takes a sample of tissue from the inside of your  uterus to be examined under a microscope.   Pelvic ultrasound. This test uses sound waves to make a picture of your uterus, ovaries, and vagina. The pictures can show if you have fibroids or other growths.   Hysteroscopy. For this test, your health care provider will use a small telescope to look inside your uterus.  Based on the results of your initial tests, your health care provider may recommend further testing.  TREATMENT   Treatment may not be needed. If it is needed, your health care provider may recommend treatment with one or more medicines first. If these do not reduce bleeding enough, a surgical treatment might be an option. The best treatment for you will depend on:    Whether you need to prevent pregnancy.   Your desire to have children in the future.   The cause and severity of your bleeding.   Your opinion and personal preference.   Medicines for menorrhagia may include:   Birth control methods that use hormones. These include the pill, skin patch, vaginal ring, shots that you get every 3 months, hormonal IUD, and implant. These treatments reduce bleeding during your menstrual period.   Medicines that thicken blood and slow bleeding.   Medicines that reduce swelling, such as ibuprofen.   Medicines that contain a synthetic hormone called progestin.    Medicines that make the ovaries stop working for a short time.     You may need surgical treatment for menorrhagia if the medicines are unsuccessful. Treatment options include:   Dilation and curettage (D&C). In this procedure, your health care provider opens (dilates) your cervix and then scrapes or suctions tissue from the lining of your uterus to reduce menstrual bleeding.   Operative hysteroscopy. This procedure uses a tiny tube with a light (hysteroscope) to view your uterine cavity and can help in the surgical removal of a polyp that may be causing heavy periods.   Endometrial ablation. Through various techniques, your health care  provider permanently destroys the entire lining of your uterus (endometrium). After endometrial ablation, most women have little or no menstrual flow. Endometrial ablation reduces your ability to become pregnant.   Endometrial resection. This surgical procedure uses an electrosurgical wire loop to remove the lining of the uterus. This procedure also reduces your ability to become pregnant.   Hysterectomy. Surgical removal of the uterus and cervix is a permanent procedure that stops menstrual periods. Pregnancy is not possible after a hysterectomy. This procedure requires anesthesia and hospitalization.  HOME CARE INSTRUCTIONS    Only take over-the-counter or prescription medicines as directed by your health care provider. Take prescribed medicines exactly as directed. Do not change or switch medicines without consulting your health care provider.   Take any prescribed iron pills exactly as directed by your health care provider. Long-term heavy bleeding may result in low iron levels. Iron pills help replace the iron your body lost from heavy bleeding. Iron may cause constipation. If this becomes a problem, increase the bran, fruits, and roughage in your diet.   Do not take aspirin or medicines that contain aspirin 1 week before or during your menstrual period. Aspirin may make the bleeding worse.   If you need to change your sanitary pad or tampon more than once every 2 hours, stay in bed and rest as much as possible until the bleeding stops.   Eat well-balanced meals. Eat foods high in iron. Examples are leafy green vegetables, meat, liver, eggs, and whole grain breads and cereals. Do not try to lose weight until the abnormal bleeding has stopped and your blood iron level is back to normal.  SEEK MEDICAL CARE IF:    You soak through a pad or tampon every 1 or 2 hours, and this happens every time you have a period.   You need to use pads and tampons at the same time because you are bleeding so much.   You  need to change your pad or tampon during the night.   You have a period that lasts for more than 8 days.   You pass clots bigger than 1 inch wide.   You have irregular periods that happen more or less often than once a month.   You feel dizzy or faint.   You feel very weak or tired.   You feel short of breath or feel your heart is beating too fast when you exercise.   You have nausea and vomiting or diarrhea while you are taking your medicine.   You have any problems that may be related to the medicine you are taking.  SEEK IMMEDIATE MEDICAL CARE IF:    You soak through 4 or more pads or tampons in 2 hours.   You have any bleeding while you are pregnant.  MAKE SURE YOU:    Understand these instructions.   Will watch your condition.   Will get help right away if you are not doing well   or get worse.  Document Released: 04/13/2005 Document Revised: 04/18/2013 Document Reviewed: 10/02/2012  [employer] Patient Information 2015 [employer]. This information is not intended to replace advice given to you by your health care provider. Make sure you discuss any questions you have with your health care provider.

## 2014-01-16 NOTE — ED Notes (Signed)
Per pt, states abdominal pain with heavy menstrual cycles-wants to make sure she does not have any tumors

## 2014-01-17 LAB — GC/CHLAMYDIA PROBE AMP
CT Probe RNA: NEGATIVE
GC PROBE AMP APTIMA: NEGATIVE

## 2014-02-26 ENCOUNTER — Encounter (HOSPITAL_COMMUNITY): Payer: Self-pay | Admitting: Emergency Medicine

## 2014-04-11 ENCOUNTER — Encounter (HOSPITAL_COMMUNITY): Payer: Self-pay | Admitting: Emergency Medicine

## 2014-04-11 ENCOUNTER — Emergency Department (HOSPITAL_COMMUNITY)
Admission: EM | Admit: 2014-04-11 | Discharge: 2014-04-11 | Disposition: A | Discharge status: Home / Self Care | Payer: Medicaid Other | Attending: Emergency Medicine | Admitting: Emergency Medicine

## 2014-04-11 DIAGNOSIS — R45851 Suicidal ideations: Secondary | ICD-10-CM | POA: Insufficient documentation

## 2014-04-11 DIAGNOSIS — Z87891 Personal history of nicotine dependence: Secondary | ICD-10-CM | POA: Insufficient documentation

## 2014-04-11 DIAGNOSIS — Z880 Allergy status to penicillin: Secondary | ICD-10-CM | POA: Insufficient documentation

## 2014-04-11 DIAGNOSIS — F329 Major depressive disorder, single episode, unspecified: Secondary | ICD-10-CM

## 2014-04-11 DIAGNOSIS — F32A Depression, unspecified: Secondary | ICD-10-CM

## 2014-04-11 LAB — COMPREHENSIVE METABOLIC PANEL
ALBUMIN: 3.8 g/dL (ref 3.5–5.2)
ALT: 22 U/L (ref 0–35)
ANION GAP: 15 (ref 5–15)
AST: 24 U/L (ref 0–37)
Alkaline Phosphatase: 81 U/L (ref 39–117)
BUN: 9 mg/dL (ref 6–23)
CALCIUM: 9.6 mg/dL (ref 8.4–10.5)
CO2: 23 mEq/L (ref 19–32)
CREATININE: 0.74 mg/dL (ref 0.50–1.10)
Chloride: 102 mEq/L (ref 96–112)
GFR calc Af Amer: >90 mL/min (ref >90)
GFR calc non Af Amer: >90 mL/min (ref >90)
Glucose, Bld: 151 mg/dL — ABNORMAL HIGH (ref 70–99)
Potassium: 3.8 mEq/L (ref 3.7–5.3)
Sodium: 140 mEq/L (ref 137–147)
Total Bilirubin: 0.3 mg/dL (ref 0.3–1.2)
Total Protein: 7.7 g/dL (ref 6.0–8.3)

## 2014-04-11 LAB — CBC
HCT: 41.3 % (ref 36.0–46.0)
Hemoglobin: 13.2 g/dL (ref 12.0–15.0)
MCH: 26.4 pg (ref 26.0–34.0)
MCHC: 32 g/dL (ref 30.0–36.0)
MCV: 82.6 fL (ref 78.0–100.0)
Platelets: 214 10*3/uL (ref 150–400)
RBC: 5 MIL/uL (ref 3.87–5.11)
RDW: 14.3 % (ref 11.5–15.5)
WBC: 7.1 10*3/uL (ref 4.0–10.5)

## 2014-04-11 LAB — ETHANOL

## 2014-04-11 LAB — ACETAMINOPHEN LEVEL: Acetaminophen (Tylenol), Serum: <15 ug/mL (ref 10–30)

## 2014-04-11 LAB — SALICYLATE LEVEL: Salicylate Lvl: <2 mg/dL — ABNORMAL LOW (ref 2.8–20.0)

## 2014-04-11 MED ORDER — LORAZEPAM 1 MG PO TABS: 1.0000 mg | ORAL_TABLET | Freq: Three times a day (TID) | ORAL | Status: DC | PRN

## 2014-04-11 MED ORDER — ALUM & MAG HYDROXIDE-SIMETH 200-200-20 MG/5ML PO SUSP: 30.0000 mL | ORAL | Status: DC | PRN

## 2014-04-11 MED ORDER — ZOLPIDEM TARTRATE 5 MG PO TABS: 5.0000 mg | ORAL_TABLET | Freq: Every evening | ORAL | Status: DC | PRN

## 2014-04-11 MED ORDER — NICOTINE 21 MG/24HR TD PT24: 21.0000 mg | MEDICATED_PATCH | Freq: Every day | TRANSDERMAL | Status: DC

## 2014-04-11 MED ORDER — ONDANSETRON HCL 4 MG PO TABS: 4.0000 mg | ORAL_TABLET | Freq: Three times a day (TID) | ORAL | Status: DC | PRN

## 2014-04-11 MED ORDER — IBUPROFEN 200 MG PO TABS: 600.0000 mg | ORAL_TABLET | Freq: Three times a day (TID) | ORAL | Status: DC | PRN

## 2014-04-11 NOTE — Discharge Instructions (Signed)
Depression °Depression refers to feeling sad, low, down in the dumps, blue, gloomy, or empty. In general, there are two kinds of depression: °· Normal sadness or normal grief. This kind of depression is one that we all feel from time to time after upsetting life experiences, such as the loss of a job or the ending of a relationship. This kind of depression is considered normal, is short lived, and resolves within a few days to 2 weeks. Depression experienced after the loss of a loved one (bereavement) often lasts longer than 2 weeks but normally gets better with time. °· Clinical depression. This kind of depression lasts longer than normal sadness or normal grief or interferes with your ability to function at home, at work, and in school. It also interferes with your personal relationships. It affects almost every aspect of your life. Clinical depression is an illness. °Symptoms of depression can also be caused by conditions other than those mentioned above, such as: °· Physical illness. Some physical illnesses, including underactive thyroid gland (hypothyroidism), severe anemia, specific types of cancer, diabetes, uncontrolled seizures, heart and lung problems, strokes, and chronic pain are commonly associated with symptoms of depression. °· Side effects of some prescription medicine. In some people, certain types of medicine can cause symptoms of depression. °· Substance abuse. Abuse of alcohol and illicit drugs can cause symptoms of depression. °SYMPTOMS °Symptoms of normal sadness and normal grief include the following: °· Feeling sad or crying for short periods of time. °· Not caring about anything (apathy). °· Difficulty sleeping or sleeping too much. °· No longer able to enjoy the things you used to enjoy. °· Desire to be by oneself all the time (social isolation). °· Lack of energy or motivation. °· Difficulty concentrating or remembering. °· Change in appetite or weight. °· Restlessness or  agitation. °Symptoms of clinical depression include the same symptoms of normal sadness or normal grief and also the following symptoms: °· Feeling sad or crying all the time. °· Feelings of guilt or worthlessness. °· Feelings of hopelessness or helplessness. °· Thoughts of suicide or the desire to harm yourself (suicidal ideation). °· Loss of touch with reality (psychotic symptoms). Seeing or hearing things that are not real (hallucinations) or having false beliefs about your life or the people around you (delusions and paranoia). °DIAGNOSIS  °The diagnosis of clinical depression is usually based on how bad the symptoms are and how long they have lasted. Your health care provider will also ask you questions about your medical history and substance use to find out if physical illness, use of prescription medicine, or substance abuse is causing your depression. Your health care provider may also order blood tests. °TREATMENT  °Often, normal sadness and normal grief do not require treatment. However, sometimes antidepressant medicine is given for bereavement to ease the depressive symptoms until they resolve. °The treatment for clinical depression depends on how bad the symptoms are but often includes antidepressant medicine, counseling with a mental health professional, or both. Your health care provider will help to determine what treatment is best for you. °Depression caused by physical illness usually goes away with appropriate medical treatment of the illness. If prescription medicine is causing depression, talk with your health care provider about stopping the medicine, decreasing the dose, or changing to another medicine. °Depression caused by the abuse of alcohol or illicit drugs goes away when you stop using these substances. Some adults need professional help in order to stop drinking or using drugs. °SEEK IMMEDIATE MEDICAL   CARE IF:  You have thoughts about hurting yourself or others.  You lose touch  with reality (have psychotic symptoms).  You are taking medicine for depression and have a serious side effect. FOR MORE INFORMATION  National Alliance on Mental Illness: www.nami.[employer] of Mental Health: https://carter.com/ Document Released: 04/10/2000 Document Revised: 08/28/2013 Document Reviewed: 07/13/2011 [facility] Patient Information 2015 [place], [place]. This information is not intended to replace advice given to you by your health care provider. Make sure you discuss any questions you have with your health care provider.   Suicidal Feelings, How to Help Yourself Everyone feels sad or unhappy at times, but depressing thoughts and feelings of hopelessness can lead to thoughts of suicide. It can seem as if life is too tough to handle. If you feel as though you have reached the point where suicide is the only answer, it is time to let someone know immediately.  HOW TO COPE AND PREVENT SUICIDE  Let family, friends, teachers, or counselors know. Get help. Try not to isolate yourself from those who care about you. Even though you may not feel sociable, talk with someone every day. It is best if it is face-to-face. Remember, they will want to help you.  Eat a regularly spaced and well-balanced diet.  Get plenty of rest.  Avoid alcohol and drugs because they will only make you feel worse and may also lower your inhibitions. Remove them from the home. If you are thinking of taking an overdose of your prescribed medicines, give your medicines to someone who can give them to you one day at a time. If you are on antidepressants, let your caregiver know of your feelings so he or she can provide a safer medicine, if that is a concern.  Remove weapons or poisons from your home.  Try to stick to routines. Follow a schedule and remind yourself that you have to keep that schedule every day.  Set some realistic goals and achieve them. Make a list and cross things off as you go.  Accomplishments give a sense of worth. Wait until you are feeling better before doing things you find difficult or unpleasant to do.  If you are able, try to start exercising. Even half-hour periods of exercise each day will make you feel better. Getting out in the sun or into nature helps you recover from depression faster. If you have a favorite place to walk, take advantage of that.  Increase safe activities that have always given you pleasure. This may include playing your favorite music, reading a good book, painting a picture, or playing your favorite instrument. Do whatever takes your mind off your depression.  Keep your living space well-lighted. GET HELP Contact a suicide hotline, crisis center, or local suicide prevention center for help right away. Local centers may include a hospital, clinic, community service organization, social service provider, or health department.  Call your local emergency services (911 in the [place]).  Call a suicide hotline:  1-800-273-TALK (1-641 211 5294) in the [place].  1-800-SUICIDE 775-529-1680) in the [place].  252-213-1052 in the [place] for Spanish-speaking counselors.  2-831-517-6HYW 217-224-2445) in the [place] for TTY users.  Visit the following websites for information and help:  National Suicide Prevention Lifeline: www.suicidepreventionlifeline.org  Hopeline: www.hopeline.[employer] for Suicide Prevention: PromotionalLoans.co.za  For lesbian, gay, bisexual, transgender, or questioning youth, contact [employer]:  5-462-7-O-JJKKXF 518-725-6826) in the [place].  www.thetrevorproject.org  In [place], treatment resources are listed in  each [place] with listings available under USAA for [employer] or similar titles. Another source for Crisis Centres by [place] is located at  http://www.suicideprevention.ca/in-crisis-now/find-a-crisis-centre-now/crisis-centres Document Released: 10/18/2002 Document Revised: 07/06/2011 Document Reviewed: 08/08/2013 [facility] Patient Information 2015 [place], [place]. This information is not intended to replace advice given to you by your health care provider. Make sure you discuss any questions you have with your health care provider.  No-harm Safety Contract  A no-harm safety contract is a written or verbal agreement between you and a mental health professional to promote safety. It contains specific actions and promises you agree to. The agreement also includes instructions from the therapist or doctor. The instructions will help prevent you from harming yourself or harming others. Harm can be as mild as pinching yourself, but can increase in intensity to actions like burning or cutting yourself. The extreme level of self-harm would be committing suicide. No-harm safety contracts are also sometimes referred to as a Radiographer, therapeutic, suicide Electrical engineer, no-harm agreements or decisions, or a Surveyor, mining.  REASONS FOR NO-HARM SAFETY CONTRACTS Safety contracts are just one part of an overall treatment plan to help keep you safe and free of harm. A safety contract may help to relieve anxiety, restore a sense of control, state clearly the alternatives to harm or suicide, and give you and your therapist or doctor a gauge for how you are doing in between visits. Many factors impact the decision to use a no-harm safety contract and its effectiveness. A proper overall treatment plan and evaluation and good patient understanding are the keys to good outcomes. CONTRACT ELEMENTS  A contract can range from simple to complex. They include all or some of the following:  Action statements. These are statements you agree to do or not do. Example: If I feel my life is becoming too difficult, I agree to do the following so there is no harm to  myself or others:  Talk with family or friends.  Rid myself of all things that I could use to harm myself.  Do an activity I enjoy or have enjoyed in the recent past. Coping strategies. These are ways to think and feel that decrease stress, such as:  Use of affirmations or positive statements about self.  Good self-care, including improved grooming, and healthy eating, and healthy sleeping patterns.  Increase physical exercise.  Increase social involvement.  Focus on positive aspects of life. Crisis management. This would include what to do if there was trouble following the contract or an urge to harm. This might include notifying family or your therapist of suicidal thoughts. Be open and honest about suicidal urges. To prevent a crisis, do the following:  List reasons to reach out for support.  Keep contact numbers and available hours handy. Treatment goals. These are goals would include no suicidal thoughts, improved mood, and feelings of hopefulness. Listed responsibilities of different people involved in care. This could include family members. A family member may agree to remove firearms or other lethal weapons/substances from your ease of access. A timeline. A timeline can be in place from one therapy session to the next session. HOME CARE INSTRUCTIONS   Follow your no-harm safety contract.  Contact your therapist and/or doctor if you have any questions or concerns. MAKE SURE YOU:   Understand these instructions.  Will watch your condition. Noticing any mood changes or suicidal urges.  Will get help right away if you are not doing well or get worse. Document Released: 10/01/2009 Document  Revised: 07/06/2011 Document Reviewed: 10/01/2009 [employer] Patient Information 2015 [place], [place]. This information is not intended to replace advice given to you by your health care provider. Make sure you discuss any questions you have with your health care provider.

## 2014-04-11 NOTE — ED Notes (Signed)
Pt w/ mobile crisis.  Pt states that she has been having suicidal ideations with plan to overdose.  Hx of depression.  Denies HI.  Denies substance abuse.

## 2014-04-11 NOTE — BH Assessment (Addendum)
Assessment completed.  Spoke with [name], PA:  Pt does not meet IP criteria.  Discharge with OP Resources given and a No Harm contract signed.  Pt states she has an appt with [facility] in the morning at 8 am so have her follow-up with them.    Had her sign a Theatre manager. (Gave copy to pt and original to [place], Therapist, sports, for her chart.)  Gave her additional OP resources list. Gave her Suicide Prevention information.  Spoke with Mayo Ao, PA-C: Advised of outcome and plan to discharge.  Faylene Kurtz, MS, CRC, [place] Triage Specialist [facility]
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
## 2014-04-11 NOTE — ED Provider Notes (Signed)
CSN: [id number]     Arrival date & time 04/11/14  1820 History   First MD Initiated Contact with Patient 04/11/14 1842     Chief Complaint  Patient presents with  . Depression  . Suicidal     (Consider location/radiation/quality/duration/timing/severity/associated sxs/prior Treatment) HPI Comments: 23 year old female with a past medical history of anxiety, depression, cognitive deficits and bipolar disorder presenting with mobile crisis with increased depression and suicidal ideation over the past week. Patient reports she has a plan of overdosing on pills. States she's been off her medications "for a while". She states she believes that her boyfriend is cheating on her and "does not treat me right". Denies any physical or sexual abuse. Denies homicidal ideations. Denies alcohol use or drug use. States she has been hospitalized for depression in the past.  The history is provided by the patient.    Past Medical History  Diagnosis Date  . Anxiety   . Mental disorder   . Depression   . Cognitive deficits   . Bipolar 1 disorder    Past Surgical History  Procedure Laterality Date  . Cesarean section    . Tonsillectomy N/A 06/03/2012    Procedure: TONSILLECTOMY;  Surgeon: [name], MD;  Location: [place];  Service: ENT;  Laterality: N/A;  . Mass excision N/A 06/03/2012    Procedure: EXCISION MASS;  Surgeon: [name], MD;  Location: [place];  Service: ENT;  Laterality: N/A;  Excision uvula mass  . Cesarean section N/A 04/25/2013    Procedure: REPEAT CESAREAN SECTION;  Surgeon: [name], MD;  Location: [facility];  Service: Obstetrics;  Laterality: N/A;   Family History  Problem Relation Age of Onset  . Hypertension Mother   . Diabetes Father    History  Substance Use Topics  . Smoking status: Former Smoker -- 7 years    Quit date: 09/30/2011  . Smokeless tobacco: Never Used  . Alcohol Use: No     Comment: Patient denies    OB  History    Gravida Para Term Preterm AB TAB SAB Ectopic Multiple Living   3 3 3  0 0 0 0 0 0 3     Review of Systems  10 Systems reviewed and are negative for acute change except as noted in the HPI.  Allergies  Penicillins  Home Medications   Prior to Admission medications   Not on File   BP 153/85 mmHg  Pulse 100  Temp(Src) 98.7 F (37.1 C) (Oral)  Resp 18  SpO2 100% Physical Exam  Constitutional: She is oriented to person, place, and time. She appears well-developed and well-nourished. No distress.  HENT:  Head: Normocephalic and atraumatic.  Mouth/Throat: Oropharynx is clear and moist.  Eyes: Conjunctivae are normal.  Neck: Normal range of motion. Neck supple.  Cardiovascular: Normal rate, regular rhythm and normal heart sounds.   Pulmonary/Chest: Effort normal and breath sounds normal.  Abdominal: Soft. Bowel sounds are normal. There is no tenderness.  Musculoskeletal: Normal range of motion. She exhibits no edema.  Neurological: She is alert and oriented to person, place, and time.  Skin: Skin is warm and dry. She is not diaphoretic.  Psychiatric: Her behavior is normal. She expresses suicidal ideation. She expresses no homicidal ideation. She expresses suicidal plans.  Flat affect.    ED Course  Procedures (including critical care time) Labs Review Labs Reviewed  COMPREHENSIVE METABOLIC PANEL - Abnormal; Notable for the following:    Glucose, Bld 151 (*)  All other components within normal limits  SALICYLATE LEVEL - Abnormal; Notable for the following:    Salicylate Lvl <5.6 (*)    All other components within normal limits  CBC  ETHANOL  ACETAMINOPHEN LEVEL  URINE RAPID DRUG SCREEN (HOSP PERFORMED)  POC URINE PREG, ED    Imaging Review No results found.   EKG Interpretation None      MDM   Final diagnoses:  Depression  Suicidal ideation   Patient presented with suicidal ideation and stated plan to overdose on medication. She is in no  apparent distress. Vital signs stable. She does not appear depressed. She was assessed by TTS counselor Faylene Kurtz, who discussed patient with [name] PA who does not feel patient meets inpatient criteria. Patient expresses that her suicidal thoughts stated to mobile crisis were for her to be able to talk to somebody, however states she really does not feel suicidal and would not try to kill herself. She was set up to see [facility] tomorrow, and if she is unable to go to [facility], she was given resources. She signed a no harm Surveyor, mining. She is stable for discharge. Return precautions given. Patient states understanding of treatment care plan and is agreeable.  Carman Ching, PA-C 04/11/14 2157  Artis Delay, MD 04/12/14 [phone]

## 2014-04-11 NOTE — ED Notes (Signed)
Bed: [facility] Expected date:  Expected time:  Means of arrival:  Comments: T3

## 2014-04-11 NOTE — ED Notes (Signed)
Called mobile crisis at [phone] to arrange transport home.  Representative states she will call back once she coordinates transportation.

## 2014-07-30 ENCOUNTER — Encounter (HOSPITAL_COMMUNITY): Payer: Self-pay

## 2014-07-30 ENCOUNTER — Emergency Department (HOSPITAL_COMMUNITY)
Admission: EM | Admit: 2014-07-30 | Discharge: 2014-07-30 | Disposition: A | Discharge status: Home / Self Care | Payer: Medicaid Other | Attending: Emergency Medicine | Admitting: Emergency Medicine

## 2014-07-30 DIAGNOSIS — Z880 Allergy status to penicillin: Secondary | ICD-10-CM | POA: Insufficient documentation

## 2014-07-30 DIAGNOSIS — R531 Weakness: Secondary | ICD-10-CM | POA: Diagnosis present

## 2014-07-30 DIAGNOSIS — Z87891 Personal history of nicotine dependence: Secondary | ICD-10-CM | POA: Diagnosis not present

## 2014-07-30 DIAGNOSIS — Z79899 Other long term (current) drug therapy: Secondary | ICD-10-CM | POA: Diagnosis not present

## 2014-07-30 DIAGNOSIS — R5383 Other fatigue: Secondary | ICD-10-CM | POA: Diagnosis not present

## 2014-07-30 LAB — CBC WITH DIFFERENTIAL/PLATELET
BASOS ABS: 0.1 10*3/uL (ref 0.0–0.1)
BASOS PCT: 1 % (ref 0–1)
Eosinophils Absolute: 0.5 10*3/uL (ref 0.0–0.7)
Eosinophils Relative: 5 % (ref 0–5)
HEMATOCRIT: 40.2 % (ref 36.0–46.0)
Hemoglobin: 12.9 g/dL (ref 12.0–15.0)
LYMPHS PCT: 37 % (ref 12–46)
Lymphs Abs: 3.4 10*3/uL (ref 0.7–4.0)
MCH: 26.3 pg (ref 26.0–34.0)
MCHC: 32.1 g/dL (ref 30.0–36.0)
MCV: 81.9 fL (ref 78.0–100.0)
MONO ABS: 0.7 10*3/uL (ref 0.1–1.0)
Monocytes Relative: 7 % (ref 3–12)
NEUTROS ABS: 4.7 10*3/uL (ref 1.7–7.7)
Neutrophils Relative %: 50 % (ref 43–77)
PLATELETS: 202 10*3/uL (ref 150–400)
RBC: 4.91 MIL/uL (ref 3.87–5.11)
RDW: 14.4 % (ref 11.5–15.5)
WBC: 9.3 10*3/uL (ref 4.0–10.5)

## 2014-07-30 LAB — BASIC METABOLIC PANEL
ANION GAP: 8 (ref 5–15)
BUN: 11 mg/dL (ref 6–23)
CHLORIDE: 106 mmol/L (ref 96–112)
CO2: 25 mmol/L (ref 19–32)
CREATININE: 0.68 mg/dL (ref 0.50–1.10)
Calcium: 9 mg/dL (ref 8.4–10.5)
GFR calc Af Amer: >90 mL/min (ref >90)
Glucose, Bld: 139 mg/dL — ABNORMAL HIGH (ref 70–99)
POTASSIUM: 3.8 mmol/L (ref 3.5–5.1)
Sodium: 139 mmol/L (ref 135–145)

## 2014-07-30 LAB — I-STAT CHEM 8, ED
BUN: 11 mg/dL (ref 6–23)
CREATININE: 0.7 mg/dL (ref 0.50–1.10)
Calcium, Ion: 1.14 mmol/L (ref 1.12–1.23)
Chloride: 105 mmol/L (ref 96–112)
Glucose, Bld: 144 mg/dL — ABNORMAL HIGH (ref 70–99)
HEMATOCRIT: 42 % (ref 36.0–46.0)
HEMOGLOBIN: 14.3 g/dL (ref 12.0–15.0)
Potassium: 4.1 mmol/L (ref 3.5–5.1)
Sodium: 142 mmol/L (ref 135–145)
TCO2: 23 mmol/L (ref 0–100)

## 2014-07-30 LAB — CBG MONITORING, ED: GLUCOSE-CAPILLARY: 194 mg/dL — AB (ref 70–99)

## 2014-07-30 NOTE — Discharge Instructions (Signed)
Your blood work today is normal. Please follow-up with your primary care doctor at [facility] urgent care center in 2 days. Do not hesitate to return to the emergency room for any new, worsening or concerning symptoms.   Fatigue Fatigue is a feeling of tiredness, lack of energy, lack of motivation, or feeling tired all the time. Having enough rest, good nutrition, and reducing stress will normally reduce fatigue. Consult your caregiver if it persists. The nature of your fatigue will help your caregiver to find out its cause. The treatment is based on the cause.  CAUSES  There are many causes for fatigue. Most of the time, fatigue can be traced to one or more of your habits or routines. Most causes fit into one or more of three general areas. They are: Lifestyle problems  Sleep disturbances.  Overwork.  Physical exertion.  Unhealthy habits.  Poor eating habits or eating disorders.  Alcohol and/or drug use .  Lack of proper nutrition (malnutrition). Psychological problems  Stress and/or anxiety problems.  Depression.  Grief.  Boredom. Medical Problems or Conditions  Anemia.  Pregnancy.  Thyroid gland problems.  Recovery from major surgery.  Continuous pain.  Emphysema or asthma that is not well controlled  Allergic conditions.  Diabetes.  Infections (such as mononucleosis).  Obesity.  Sleep disorders, such as sleep apnea.  Heart failure or other heart-related problems.  Cancer.  Kidney disease.  Liver disease.  Effects of certain medicines such as antihistamines, cough and cold remedies, prescription pain medicines, heart and blood pressure medicines, drugs used for treatment of cancer, and some antidepressants. SYMPTOMS  The symptoms of fatigue include:   Lack of energy.  Lack of drive (motivation).  Drowsiness.  Feeling of indifference to the surroundings. DIAGNOSIS  The details of how you feel help guide your caregiver in finding out what is  causing the fatigue. You will be asked about your present and past health condition. It is important to review all medicines that you take, including prescription and non-prescription items. A thorough exam will be done. You will be questioned about your feelings, habits, and normal lifestyle. Your caregiver may suggest blood tests, urine tests, or other tests to look for common medical causes of fatigue.  TREATMENT  Fatigue is treated by correcting the underlying cause. For example, if you have continuous pain or depression, treating these causes will improve how you feel. Similarly, adjusting the dose of certain medicines will help in reducing fatigue.  HOME CARE INSTRUCTIONS   Try to get the required amount of good sleep every night.  Eat a healthy and nutritious diet, and drink enough water throughout the day.  Practice ways of relaxing (including yoga or meditation).  Exercise regularly.  Make plans to change situations that cause stress. Act on those plans so that stresses decrease over time. Keep your work and personal routine reasonable.  Avoid street drugs and minimize use of alcohol.  Start taking a daily multivitamin after consulting your caregiver. SEEK MEDICAL CARE IF:   You have persistent tiredness, which cannot be accounted for.  You have fever.  You have unintentional weight loss.  You have headaches.  You have disturbed sleep throughout the night.  You are feeling sad.  You have constipation.  You have dry skin.  You have gained weight.  You are taking any new or different medicines that you suspect are causing fatigue.  You are unable to sleep at night.  You develop any unusual swelling of your legs or other  parts of your body. SEEK IMMEDIATE MEDICAL CARE IF:   You are feeling confused.  Your vision is blurred.  You feel faint or pass out.  You develop severe headache.  You develop severe abdominal, pelvic, or back pain.  You develop chest  pain, shortness of breath, or an irregular or fast heartbeat.  You are unable to pass a normal amount of urine.  You develop abnormal bleeding such as bleeding from the rectum or you vomit blood.  You have thoughts about harming yourself or committing suicide.  You are worried that you might harm someone else. MAKE SURE YOU:   Understand these instructions.  Will watch your condition.  Will get help right away if you are not doing well or get worse. Document Released: 02/08/2007 Document Revised: 07/06/2011 Document Reviewed: 08/15/2013 [facility] Patient Information 2015 [place], [place]. This information is not intended to replace advice given to you by your health care provider. Make sure you discuss any questions you have with your health care provider.

## 2014-07-30 NOTE — ED Provider Notes (Signed)
CSN: [id number]     Arrival date & time 07/30/14  1515 History   First MD Initiated Contact with Patient 07/30/14 1808     Chief Complaint  Patient presents with  . Weakness     (Consider location/radiation/quality/duration/timing/severity/associated sxs/prior Treatment) HPI   Mozel A Bruni is a 24 y.o. female complaining of  complaining of generalized fatigue over the course of 3 months, states that she feels so tired she could no longer cook dinner today. Patient denies focal weakness, fever, chills, cough, headache, chest pain, shortness of breath, abdominal pain, nausea, vomiting, dysuria, hematuria, concentrated urine, suicidal ideation, homicidal ideation, auditory or visual hallucinations. She denies polydipsia and polyuria but states that she drinks a lot of soda all the time. She states that she has low back pain which is nonradiating and bilateral lower extremity pain focused around the knees especially when she walks. Patient denies history of cancer, IV drug use.   Past Medical History  Diagnosis Date  . Anxiety   . Mental disorder   . Depression   . Cognitive deficits   . Bipolar 1 disorder    Past Surgical History  Procedure Laterality Date  . Cesarean section    . Tonsillectomy N/A 06/03/2012    Procedure: TONSILLECTOMY;  Surgeon: [name], MD;  Location: [place] [facility];  Service: ENT;  Laterality: N/A;  . Mass excision N/A 06/03/2012    Procedure: EXCISION MASS;  Surgeon: [name], MD;  Location: [place];  Service: ENT;  Laterality: N/A;  Excision uvula mass  . Cesarean section N/A 04/25/2013    Procedure: REPEAT CESAREAN SECTION;  Surgeon: [name], MD;  Location: [facility];  Service: Obstetrics;  Laterality: N/A;   Family History  Problem Relation Age of Onset  . Hypertension Mother   . Diabetes Father    History  Substance Use Topics  . Smoking status: Former Smoker -- 7 years    Quit date: 09/30/2011  . Smokeless  tobacco: Never Used  . Alcohol Use: No     Comment: Patient denies    OB History    Gravida Para Term Preterm AB TAB SAB Ectopic Multiple Living   3 3 3  0 0 0 0 0 0 3     Review of Systems  10 systems reviewed and found to be negative, except as noted in the HPI.   Allergies  Penicillins  Home Medications   Prior to Admission medications   Medication Sig Start Date End Date Taking? Authorizing Provider  DULoxetine (CYMBALTA) 60 MG capsule Take 60 mg by mouth daily.   Yes Historical Provider, MD  paliperidone (INVEGA SUSTENNA) 234 MG/1.5ML SUSP injection Inject 234 mg into the muscle every 30 (thirty) days.   Yes Historical Provider, MD   BP 119/74 mmHg  Pulse 98  Temp(Src) 98.2 F (36.8 C) (Oral)  Resp 16  SpO2 98% Physical Exam  Constitutional:  Obese  HENT:  Head: Normocephalic.  Mouth/Throat: Oropharynx is clear and moist.  Eyes: Pupils are equal, round, and reactive to light.  Cardiovascular: Normal rate, regular rhythm and intact distal pulses.   Pulmonary/Chest: Effort normal and breath sounds normal. No respiratory distress. She has no wheezes. She has no rales. She exhibits no tenderness.  Abdominal: Soft. Bowel sounds are normal. She exhibits no distension and no mass. There is no tenderness. There is no rebound and no guarding.  Musculoskeletal: She exhibits no edema or tenderness.  No point tenderness to percussion of lumbar spinal  processes.  No TTP or paraspinal muscular spasm. Strength is 5 out of 5 to bilateral lower extremities at hip and knee; extensor hallucis longus 5 out of 5. Ankle strength 5 out of 5, no clonus, neurovascularly intact. No saddle anaesthesia. Patellar reflexes are 2+ bilaterally.    Straight leg raise is negative bilaterally, patient ambulatory coordinated in nonantalgic gait.     ED Course  Procedures (including critical care time) Labs Review Labs Reviewed  BASIC METABOLIC PANEL - Abnormal; Notable for the following:     Glucose, Bld 139 (*)    All other components within normal limits  CBG MONITORING, ED - Abnormal; Notable for the following:    Glucose-Capillary 194 (*)    All other components within normal limits  I-STAT CHEM 8, ED - Abnormal; Notable for the following:    Glucose, Bld 144 (*)    All other components within normal limits  CBC WITH DIFFERENTIAL/PLATELET    Imaging Review No results found.   EKG Interpretation None      MDM   Final diagnoses:  Other fatigue    Filed Vitals:   07/30/14 1515 07/30/14 1533 07/30/14 1814  BP:  157/82 119/74  Pulse:  97 98  Temp:  98.8 F (37.1 C) 98.2 F (36.8 C)  TempSrc:  Oral   Resp:  18 16  SpO2: 100% 97% 98%    Medications - No data to display  Alixis A Greif is a pleasant 24 y.o. female presenting with an realized fatigue. Vital signs and physical exam without abnormality. Blood work unremarkable, I've advised her to follow closely with primary care.  Evaluation does not show pathology that would require ongoing emergent intervention or inpatient treatment. Pt is hemodynamically stable and mentating appropriately. Discussed findings and plan with patient/guardian, who agrees with care plan. All questions answered. Return precautions discussed and outpatient follow up given.       Monico Blitz, PA-C 07/31/14 [place], MD 08/04/14 [phone]

## 2014-07-30 NOTE — ED Notes (Signed)
Per EMS, Pt, from home, c/o weakness x 3 months and low back pain and BLE pain w/ walking x 1 week.  Pain score 3/10.  Pt reports that she sleeps "all the time." Hx of depression and bipolar 1 disorder.

## 2014-08-31 ENCOUNTER — Encounter (HOSPITAL_COMMUNITY): Payer: Self-pay | Admitting: Emergency Medicine

## 2014-08-31 ENCOUNTER — Emergency Department (HOSPITAL_COMMUNITY)
Admission: EM | Admit: 2014-08-31 | Discharge: 2014-08-31 | Disposition: A | Discharge status: Home / Self Care | Payer: Medicaid Other | Attending: Emergency Medicine | Admitting: Emergency Medicine

## 2014-08-31 DIAGNOSIS — R197 Diarrhea, unspecified: Secondary | ICD-10-CM | POA: Diagnosis not present

## 2014-08-31 DIAGNOSIS — R1110 Vomiting, unspecified: Secondary | ICD-10-CM

## 2014-08-31 DIAGNOSIS — F419 Anxiety disorder, unspecified: Secondary | ICD-10-CM | POA: Insufficient documentation

## 2014-08-31 DIAGNOSIS — R5383 Other fatigue: Secondary | ICD-10-CM | POA: Insufficient documentation

## 2014-08-31 DIAGNOSIS — F319 Bipolar disorder, unspecified: Secondary | ICD-10-CM | POA: Diagnosis not present

## 2014-08-31 DIAGNOSIS — R000 Tachycardia, unspecified: Secondary | ICD-10-CM | POA: Insufficient documentation

## 2014-08-31 DIAGNOSIS — R7309 Other abnormal glucose: Secondary | ICD-10-CM | POA: Insufficient documentation

## 2014-08-31 DIAGNOSIS — Z79899 Other long term (current) drug therapy: Secondary | ICD-10-CM | POA: Insufficient documentation

## 2014-08-31 DIAGNOSIS — R112 Nausea with vomiting, unspecified: Secondary | ICD-10-CM | POA: Diagnosis not present

## 2014-08-31 DIAGNOSIS — Z880 Allergy status to penicillin: Secondary | ICD-10-CM | POA: Insufficient documentation

## 2014-08-31 DIAGNOSIS — Z9889 Other specified postprocedural states: Secondary | ICD-10-CM | POA: Insufficient documentation

## 2014-08-31 DIAGNOSIS — Z3202 Encounter for pregnancy test, result negative: Secondary | ICD-10-CM | POA: Insufficient documentation

## 2014-08-31 DIAGNOSIS — Z720 Tobacco use: Secondary | ICD-10-CM | POA: Insufficient documentation

## 2014-08-31 LAB — CBC WITH DIFFERENTIAL/PLATELET
BASOS ABS: 0 10*3/uL (ref 0.0–0.1)
BASOS PCT: 0 % (ref 0–1)
Eosinophils Absolute: 0.2 10*3/uL (ref 0.0–0.7)
Eosinophils Relative: 3 % (ref 0–5)
HCT: 39.7 % (ref 36.0–46.0)
HEMOGLOBIN: 12.9 g/dL (ref 12.0–15.0)
Lymphocytes Relative: 35 % (ref 12–46)
Lymphs Abs: 2.8 10*3/uL (ref 0.7–4.0)
MCH: 26.5 pg (ref 26.0–34.0)
MCHC: 32.5 g/dL (ref 30.0–36.0)
MCV: 81.5 fL (ref 78.0–100.0)
MONO ABS: 0.5 10*3/uL (ref 0.1–1.0)
Monocytes Relative: 7 % (ref 3–12)
Neutro Abs: 4.6 10*3/uL (ref 1.7–7.7)
Neutrophils Relative %: 55 % (ref 43–77)
PLATELETS: 211 10*3/uL (ref 150–400)
RBC: 4.87 MIL/uL (ref 3.87–5.11)
RDW: 14.1 % (ref 11.5–15.5)
WBC: 8.2 10*3/uL (ref 4.0–10.5)

## 2014-08-31 LAB — LIPASE, BLOOD: Lipase: 21 U/L — ABNORMAL LOW (ref 22–51)

## 2014-08-31 LAB — COMPREHENSIVE METABOLIC PANEL
ALT: 26 U/L (ref 14–54)
ANION GAP: 6 (ref 5–15)
AST: 30 U/L (ref 15–41)
Albumin: 4.3 g/dL (ref 3.5–5.0)
Alkaline Phosphatase: 83 U/L (ref 38–126)
BUN: 13 mg/dL (ref 6–20)
CHLORIDE: 107 mmol/L (ref 101–111)
CO2: 26 mmol/L (ref 22–32)
CREATININE: 0.85 mg/dL (ref 0.44–1.00)
Calcium: 9.1 mg/dL (ref 8.9–10.3)
GFR calc Af Amer: >60 mL/min (ref >60)
Glucose, Bld: 196 mg/dL — ABNORMAL HIGH (ref 70–99)
Potassium: 3.9 mmol/L (ref 3.5–5.1)
SODIUM: 139 mmol/L (ref 135–145)
Total Bilirubin: 0.5 mg/dL (ref 0.3–1.2)
Total Protein: 7.6 g/dL (ref 6.5–8.1)

## 2014-08-31 LAB — URINALYSIS, ROUTINE W REFLEX MICROSCOPIC
BILIRUBIN URINE: NEGATIVE
Glucose, UA: NEGATIVE mg/dL
Hgb urine dipstick: NEGATIVE
KETONES UR: NEGATIVE mg/dL
Leukocytes, UA: NEGATIVE
Nitrite: NEGATIVE
Protein, ur: NEGATIVE mg/dL
Specific Gravity, Urine: 1.028 (ref 1.005–1.030)
UROBILINOGEN UA: 0.2 mg/dL (ref 0.0–1.0)
pH: 5.5 (ref 5.0–8.0)

## 2014-08-31 LAB — PREGNANCY, URINE: Preg Test, Ur: NEGATIVE

## 2014-08-31 MED ORDER — ONDANSETRON 8 MG PO TBDP
8.0000 mg | ORAL_TABLET | Freq: Once | ORAL | Status: AC
  Administered 2014-08-31: 8 mg via ORAL
  Filled 2014-08-31: qty 1

## 2014-08-31 NOTE — ED Notes (Signed)
Bed: XF07 Expected date:  Expected time:  Means of arrival:  Comments: EMS-N/V

## 2014-08-31 NOTE — ED Notes (Signed)
Pt states that she has had "sleepiness for months and nausea only today".  Pt also adds that she doesn't know if she is diabetic or not.  Pt states that she has a PCP but hasnt seen them for her sleepiness.

## 2014-08-31 NOTE — ED Provider Notes (Signed)
CSN: [id number]     Arrival date & time 08/31/14  1316 History   First MD Initiated Contact with Patient 08/31/14 1454     Chief Complaint  Patient presents with  . Nausea  . Emesis  . Fatigue     (Consider location/radiation/quality/duration/timing/severity/associated sxs/prior Treatment) Patient is a 24 y.o. female presenting with vomiting. The history is provided by the patient. No language interpreter was used.  Emesis Associated symptoms: diarrhea   [name] is a 24 y.o female with a history of anxiety, depression, and bipolar disorder who presents with 1 episode of vomiting and 2 episodes of diarrhea this morning.  She had some backed chicken at noon. She states she has also felt fatigued for the past 3 months.  She is currently on birth control.  She denies any fever, headache, chest pain, shortness of breath, abdominal pain, hematemesis, nausea, dysuria, hematuria, or leg swelling. She denies any alcohol use.  She smokes 1/2 pack of cigarettes every 2-3 days. She denies any fever, abdominal pain, dysuria, hematuria, or urinary frequency.   Past Medical History  Diagnosis Date  . Anxiety   . Mental disorder   . Depression   . Cognitive deficits   . Bipolar 1 disorder    Past Surgical History  Procedure Laterality Date  . Cesarean section    . Tonsillectomy N/A 06/03/2012    Procedure: TONSILLECTOMY;  Surgeon: [name], MD;  Location: [facility];  Service: ENT;  Laterality: N/A;  . Mass excision N/A 06/03/2012    Procedure: EXCISION MASS;  Surgeon: [name], MD;  Location: [place];  Service: ENT;  Laterality: N/A;  Excision uvula mass  . Cesarean section N/A 04/25/2013    Procedure: REPEAT CESAREAN SECTION;  Surgeon: [name], MD;  Location: [facility];  Service: Obstetrics;  Laterality: N/A;  . Tonsillectomy     Family History  Problem Relation Age of Onset  . Hypertension Mother   . Diabetes Father    History  Substance Use  Topics  . Smoking status: Current Every Day Smoker -- 7 years    Types: Cigarettes  . Smokeless tobacco: Never Used  . Alcohol Use: No   OB History    Gravida Para Term Preterm AB TAB SAB Ectopic Multiple Living   3 3 3  0 0 0 0 0 0 3     Review of Systems  Gastrointestinal: Positive for vomiting and diarrhea. Negative for blood in stool.  Neurological: Negative for dizziness, syncope and weakness.  All other systems reviewed and are negative.     Allergies  Penicillins  Home Medications   Prior to Admission medications   Medication Sig Start Date End Date Taking? Authorizing Provider  DULoxetine (CYMBALTA) 60 MG capsule Take 60 mg by mouth daily.   Yes Historical Provider, MD  OVER THE COUNTER MEDICATION Take 1 tablet by mouth 2 (two) times daily. OTC diet pill   Yes Historical Provider, MD  paliperidone (INVEGA SUSTENNA) 234 MG/1.5ML SUSP injection Inject 234 mg into the muscle every 30 (thirty) days.   Yes Historical Provider, MD   BP 116/71 mmHg  Pulse 94  Temp(Src) 98.4 F (36.9 C) (Oral)  Resp 20  SpO2 99% Physical Exam  Constitutional: She is oriented to person, place, and time. She appears well-developed and well-nourished.  HENT:  Head: Normocephalic and atraumatic.  Eyes: Conjunctivae are normal.  Neck: Normal range of motion. Neck supple.  Cardiovascular: Tachycardia present.   Pulmonary/Chest: Effort normal  and breath sounds normal.  Abdominal: Soft. She exhibits no mass. There is no tenderness. There is no guarding.  Musculoskeletal: Normal range of motion.  Neurological: She is alert and oriented to person, place, and time.  Skin: Skin is warm and dry.    ED Course  Procedures (including critical care time) Labs Review Labs Reviewed  COMPREHENSIVE METABOLIC PANEL - Abnormal; Notable for the following:    Glucose, Bld 196 (*)    All other components within normal limits  URINALYSIS, ROUTINE W REFLEX MICROSCOPIC - Abnormal; Notable for the  following:    APPearance CLOUDY (*)    All other components within normal limits  LIPASE, BLOOD - Abnormal; Notable for the following:    Lipase 21 (*)    All other components within normal limits  CBC WITH DIFFERENTIAL/PLATELET  PREGNANCY, URINE  POC URINE PREG, ED    Imaging Review No results found.   EKG Interpretation None      MDM   Final diagnoses:  Vomiting and diarrhea  Elevated glucose  Patient presents for fatigue x 3 months and 1 episode of vomiting and 2 episodes of diarrhea this morning.  Her vomiting and diarrhea have resolved.  She is asymptomatic now with no abdominal pain or pelvic pain.  She is non toxic appearing and her vitals are stable. Tachycardia has resolved.  Her labs are not concerning.  She does not have a UTI and is not pregnant.  Her glucose is slightly elevated at 196 but has been elevated in the past.  I have discussed this with her as well as need for follow up regarding glucose.  I gave her the resource guide to find a provider and she agrees with the plan.       Ottie Glazier, PA-C 08/31/14 [place], MD 08/31/14 [phone]

## 2014-08-31 NOTE — ED Notes (Signed)
Patient unable to provide a urine sample at this time. Patient states they will let staff know when they are able.

## 2014-08-31 NOTE — ED Notes (Signed)
Per EMS pt comes from home c/o nausea over the past several months but got worse today.  Pt states that she vomited twice and little diarrhea before calling EMS for the second time today.  Pt refused transport earlier today after her vitals were all normal.

## 2014-08-31 NOTE — Discharge Instructions (Signed)
Nausea and Vomiting °Nausea is a sick feeling that often comes before throwing up (vomiting). Vomiting is a reflex where stomach contents come out of your mouth. Vomiting can cause severe loss of body fluids (dehydration). Children and elderly adults can become dehydrated quickly, especially if they also have diarrhea. Nausea and vomiting are symptoms of a condition or disease. It is important to find the cause of your symptoms. °CAUSES  °· Direct irritation of the stomach lining. This irritation can result from increased acid production (gastroesophageal reflux disease), infection, food poisoning, taking certain medicines (such as nonsteroidal anti-inflammatory drugs), alcohol use, or tobacco use. °· Signals from the brain. These signals could be caused by a headache, heat exposure, an inner ear disturbance, increased pressure in the brain from injury, infection, a tumor, or a concussion, pain, emotional stimulus, or metabolic problems. °· An obstruction in the gastrointestinal tract (bowel obstruction). °· Illnesses such as diabetes, hepatitis, gallbladder problems, appendicitis, kidney problems, cancer, sepsis, atypical symptoms of a heart attack, or eating disorders. °· Medical treatments such as chemotherapy and radiation. °· Receiving medicine that makes you sleep (general anesthetic) during surgery. °DIAGNOSIS °Your caregiver may ask for tests to be done if the problems do not improve after a few days. Tests may also be done if symptoms are severe or if the reason for the nausea and vomiting is not clear. Tests may include: °· Urine tests. °· Blood tests. °· Stool tests. °· Cultures (to look for evidence of infection). °· X-rays or other imaging studies. °Test results can help your caregiver make decisions about treatment or the need for additional tests. °TREATMENT °You need to stay well hydrated. Drink frequently but in small amounts. You may wish to drink water, sports drinks, clear broth, or eat frozen  ice pops or gelatin dessert to help stay hydrated. When you eat, eating slowly may help prevent nausea. There are also some antinausea medicines that may help prevent nausea. °HOME CARE INSTRUCTIONS  °· Take all medicine as directed by your caregiver. °· If you do not have an appetite, do not force yourself to eat. However, you must continue to drink fluids. °· If you have an appetite, eat a normal diet unless your caregiver tells you differently. °¨ Eat a variety of complex carbohydrates (rice, wheat, potatoes, bread), lean meats, yogurt, fruits, and vegetables. °¨ Avoid high-fat foods because they are more difficult to digest. °· Drink enough water and fluids to keep your urine clear or pale yellow. °· If you are dehydrated, ask your caregiver for specific rehydration instructions. Signs of dehydration may include: °¨ Severe thirst. °¨ Dry lips and mouth. °¨ Dizziness. °¨ Dark urine. °¨ Decreasing urine frequency and amount. °¨ Confusion. °¨ Rapid breathing or pulse. °SEEK IMMEDIATE MEDICAL CARE IF:  °· You have blood or brown flecks (like coffee grounds) in your vomit. °· You have black or bloody stools. °· You have a severe headache or stiff neck. °· You are confused. °· You have severe abdominal pain. °· You have chest pain or trouble breathing. °· You do not urinate at least once every 8 hours. °· You develop cold or clammy skin. °· You continue to vomit for longer than 24 to 48 hours. °· You have a fever. °MAKE SURE YOU:  °· Understand these instructions. °· Will watch your condition. °· Will get help right away if you are not doing well or get worse. °Document Released: 04/13/2005 Document Revised: 07/06/2011 Document Reviewed: 09/10/2010 °ExitCare® Patient Information ©2015 [employer]. This information is not intended   to replace advice given to you by your health care provider. Make sure you discuss any questions you have with your health care provider.  Emergency Department Resource Guide 1) Find a  Doctor and Pay Out of Pocket Although you won't have to find out who is covered by your insurance plan, it is a good idea to ask around and get recommendations. You will then need to call the office and see if the doctor you have chosen will accept you as a new patient and what types of options they offer for patients who are self-pay. Some doctors offer discounts or will set up payment plans for their patients who do not have insurance, but you will need to ask so you aren't surprised when you get to your appointment.  2) Contact Your Local Health Department Not all health departments have doctors that can see patients for sick visits, but many do, so it is worth a call to see if yours does. If you don't know where your local health department is, you can check in your phone book. The CDC also has a tool to help you locate your state's health department, and many state websites also have listings of all of their local health departments.  3) Find a [facility] If your illness is not likely to be very severe or complicated, you may want to try a walk in clinic. These are popping up all over the country in pharmacies, drugstores, and shopping centers. They're usually staffed by nurse practitioners or physician assistants that have been trained to treat common illnesses and complaints. They're usually fairly quick and inexpensive. However, if you have serious medical issues or chronic medical problems, these are probably not your best option.  No Primary Care Doctor: - Call Health Connect at  [phone] - they can help you locate a primary care doctor that  accepts your insurance, provides certain services, etc. - Physician Referral Service- [phone]  Chronic Pain Problems: Organization         Address  Phone   Notes  [facility]  [phone] Patients need to be referred by their primary care doctor.   Medication Assistance: Organization         Address  Phone    Notes  [facility] Medication [facility] [address] [place], [place] [address] [phone] --Must be a resident of [facility] -- Must have NO insurance coverage whatsoever (no Medicaid/ Medicare, etc.) -- The pt. MUST have a primary care doctor that directs their care regularly and follows them in the community   MedAssist  [phone]   [employer]  [phone]    Agencies that provide inexpensive medical care: Organization         Address  Phone   Notes  [place]  [phone]   [name]    [phone]   [facility] [place], [place] [address] [phone]   [facility]an [address] [place] [phone]   Planned Parenthood    [phone]   [facility]    [phone]   [place] and [place] [address], [place] Phone:  [phone], Fax:  [phone] Hours of Operation:  9 am - 6 pm, M-F.  Also accepts Medicaid/Medicare and self-pay.  [facility] for [address], [employer] Phone: [phone]  [phone], Fax: [phone]. Hours of Operation:  8:30 am - 5:30 pm, M-F.  Also accepts Medicaid and self-pay.  [facility] [place] [address] [place] Phone: [phone]   [place], [place], [place] [phone], Ext. [phone] Mondays & Thursdays: 7-9 AM.  First 15 patients are seen on a first come, first serve basis.    [place] Providers:  Organization         Address  Phone   Notes  [facility] [address], [place] [facility] [phone] Also accepts self-pay patients.  [facility] [address], [place]  [phone]   [place], [address], [place] [phone]   [facility] Family  Medicine [address], [place] [phone]   Lucianne Lei [address], [place]   [phone] Only accepts [place] Access [place] patients after they have their name applied to their card.   Self-Pay (no insurance) in [facility]:  Organization         Address  Phone   Notes  Sickle Cell Patients, [facility] Internal [facility] [place] [phone]   [facility] [place] [phone]   [name] [place]  [place], [place], [place] [phone]   [facility]/Dr. Osei-Bonsu  [address], [place] or [address], [place] [phone] Phone number for both [place] and [place] locations is the same.  Urgent [facility] and [facility] [address], [place] [phone]   [facility] [address], [place] or [address] [phone]   [facility] [address] [place] [phone], phone; [phone], fax Sees patients 1st and 3rd Saturday of every month.  Must not qualify for public or private insurance (i.e. Medicaid, Medicare, [place] Health Choice, Veterans' Benefits)  Household income should be no more than 200% of the poverty level The clinic cannot treat you if you are pregnant or think you are pregnant  Sexually transmitted diseases are not treated at the clinic.    Dental Care: Organization         Address  Phone  Notes  [facility] Department of [facility] [place] [phone] Accepts children up to age 59 who are enrolled in [place] or [place]; pregnant women with a Medicaid card; and children who have applied for Medicaid or [place] Health Choice, but were declined, whose parents can pay a reduced fee at time of service.  [facility] Department of [facility]  [address], [place] [phone] Accepts children up to age 36 who are enrolled in [place] or [place]; pregnant women with a Medicaid card; and children who have applied for Medicaid or [place] Health Choice, but were declined, whose parents can pay a reduced fee at time of service.  [facility]  [place] [phone] Patients are seen by appointment only. Walk-ins are not accepted. [place] will see patients 74 years of age and older. Monday - Tuesday (8am-5pm) Most Wednesdays (8:30-5pm) $30 per visit, cash only  [place]  [address],  [place] [phone] Patients are seen by appointment only. Walk-ins are not accepted. [place] will see patients 62 years of age and older. One Wednesday Evening (Monthly: Volunteer Based).  $30 per visit, cash only  [place]  [phone] for adults; Children under age 37, call Graduate Pediatric Dentistry at [phone]. Children aged 34-14, please call [phone] to request a pediatric application.  Dental services are provided in all areas of dental care including fillings, crowns and bridges, complete and partial dentures, implants, gum treatment, root canals, and extractions. Preventive care is also provided. Treatment is provided to both adults and children. Patients are selected via a lottery and there is often a waiting list.   [facility] [address], [place]  [phone] [email or web address]   [facility] [address] [place], [place] [phone], Ext. [phone] Second and Fourth Thursday of each month, opens at 6:30 AM; Clinic ends at 9 AM.  Patients are seen on a first-come first-served basis, and a limited number are seen during each clinic.   [facility]  [address] Hillard Danker [place], [place] [phone]   Eligibility Requirements You must have lived in  [place], [place], or [place] counties for at least the last three months.   You cannot be eligible for state or federal sponsored [employer], including [employer], [place], or [employer].   You generally cannot be eligible for healthcare insurance through your employer.    How to apply: Eligibility screenings are held every Tuesday and Wednesday afternoon from 1:00 pm until 4:00 pm. You do not need an appointment for the interview!  [facility] [address] [place], [place]   [place]  [place] Department  [place]  [phone]    Behavioral Health Resources in the Community: Intensive Outpatient Programs Organization         Address  Phone  Notes  Boyd Blackgum. [address], [place], [place] [phone]   [facility] Outpatient [address] [place], [place]   ADS: Alcohol & Drug Svcs [address], [place], [place]   [place] [address]  [place], [place] or [phone]   Substance Abuse Resources Organization         Address  Phone  Notes  Alcohol and Drug Services  [phone]   [place]  [phone]   [facility] [place]   Chinita Pester  [phone]   Residential & Outpatient Substance Abuse Program  [phone]   Psychological Services Organization         Address  Phone  Notes  [facility] [place]  [place]  [phone]   [place] [address], [place] or [phone]    Mobile Crisis Teams Organization         Address  Phone  Notes  [facility], Mobile Crisis Care Unit  [phone]   [facility]ive Psychotherapeutic Services  [address] [place], [place]   [place] [address], [place] [phone]    Self-Help/Support Groups Organization         Address  Phone             Notes  [place]. of [place] - variety of support  groups  336- [phone] Call for more information  Narcotics Anonymous (NA), [facility]aring [facility] [address], [employer] [place]  2 meetings at this location   Residential Facilities manager         Address  Phone  Notes  [facility] [place],    [place]  [phone]   [facility]  [address], [place] [phone], [place], [place]   [place], [place] [phone] Admissions: 8am-3pm M-F  Incentives Substance [place] [address],    [place], [place] [phone]   [facility] [address] [place], [place], [place]   [facility] [address],  [place], [place]   [facility] Programs - Intensive Outpatient [address]een [address], [place], [place]   [facility] ([place] [place].,  [place], [place] [phone] or [phone]   [facility] (RTS) [address], [place], [place] Accepts Medicaid  [facility] [place] [address],  [place] [phone] Substance Abuse/Addiction Treatment   [facility] Organization         Address  Phone  Notes  [facility]  [phone]   Domenic Schwab, PhD [address] Arlis Porta [place], [place]   [phone] or [phone]   [facility] [place], [place] [phone]   [facility] [address] [place], [place] [phone] Insurance/Medicaid/sponsorship through [facility] and Families [address] [place]                                    [place], [place] [phone] [place] [address], [place] [phone]    Dr. Adele Schilder  [phone]   [facility] Dept. 1) [address] [place] 2) [place] 3)  [place] [address], [place] [phone]  [phone]   [facility] [place] [phone] or [phone] (After Hours)

## 2014-09-04 ENCOUNTER — Emergency Department (HOSPITAL_COMMUNITY)
Admission: EM | Admit: 2014-09-04 | Discharge: 2014-09-04 | Disposition: A | Discharge status: Home / Self Care | Payer: Medicaid Other | Attending: Emergency Medicine | Admitting: Emergency Medicine

## 2014-09-04 ENCOUNTER — Encounter (HOSPITAL_COMMUNITY): Payer: Self-pay | Admitting: *Deleted

## 2014-09-04 DIAGNOSIS — Z3202 Encounter for pregnancy test, result negative: Secondary | ICD-10-CM | POA: Insufficient documentation

## 2014-09-04 DIAGNOSIS — Z79899 Other long term (current) drug therapy: Secondary | ICD-10-CM | POA: Insufficient documentation

## 2014-09-04 DIAGNOSIS — Z880 Allergy status to penicillin: Secondary | ICD-10-CM | POA: Diagnosis not present

## 2014-09-04 DIAGNOSIS — R5383 Other fatigue: Secondary | ICD-10-CM

## 2014-09-04 DIAGNOSIS — Z720 Tobacco use: Secondary | ICD-10-CM | POA: Insufficient documentation

## 2014-09-04 DIAGNOSIS — F419 Anxiety disorder, unspecified: Secondary | ICD-10-CM | POA: Insufficient documentation

## 2014-09-04 DIAGNOSIS — F319 Bipolar disorder, unspecified: Secondary | ICD-10-CM | POA: Insufficient documentation

## 2014-09-04 DIAGNOSIS — R112 Nausea with vomiting, unspecified: Secondary | ICD-10-CM | POA: Insufficient documentation

## 2014-09-04 DIAGNOSIS — R1111 Vomiting without nausea: Secondary | ICD-10-CM

## 2014-09-04 LAB — URINALYSIS, ROUTINE W REFLEX MICROSCOPIC
Bilirubin Urine: NEGATIVE
Glucose, UA: NEGATIVE mg/dL
HGB URINE DIPSTICK: NEGATIVE
Ketones, ur: NEGATIVE mg/dL
Leukocytes, UA: NEGATIVE
NITRITE: NEGATIVE
PH: 6 (ref 5.0–8.0)
Protein, ur: 100 mg/dL — AB
SPECIFIC GRAVITY, URINE: 1.027 (ref 1.005–1.030)
UROBILINOGEN UA: 0.2 mg/dL (ref 0.0–1.0)

## 2014-09-04 LAB — RAPID URINE DRUG SCREEN, HOSP PERFORMED
Amphetamines: NOT DETECTED
BARBITURATES: NOT DETECTED
BENZODIAZEPINES: NOT DETECTED
COCAINE: NOT DETECTED
OPIATES: NOT DETECTED
TETRAHYDROCANNABINOL: NOT DETECTED

## 2014-09-04 LAB — URINE MICROSCOPIC-ADD ON

## 2014-09-04 LAB — POC URINE PREG, ED: Preg Test, Ur: NEGATIVE

## 2014-09-04 MED ORDER — FAMOTIDINE 20 MG PO TABS: 20.0000 mg | ORAL_TABLET | Freq: Two times a day (BID) | ORAL | Status: DC

## 2014-09-04 NOTE — ED Notes (Signed)
Per EMS, pt from home.  Pt c/o emesis with weakness x 5 days.  No fever.  No abdominal pain.  Pt was seen here 4 days ago for same.  Pt given 4 mg zofran in 20 g LAC.  Vitals 138/113, hr 114, resp 20, 96% ra.  cbg 159

## 2014-09-04 NOTE — Discharge Instructions (Signed)
Possible Gastritis, Adult Gastritis is soreness and swelling (inflammation) of the lining of the stomach. Gastritis can develop as a sudden onset (acute) or long-term (chronic) condition. If gastritis is not treated, it can lead to stomach bleeding and ulcers. CAUSES  Gastritis occurs when the stomach lining is weak or damaged. Digestive juices from the stomach then inflame the weakened stomach lining. The stomach lining may be weak or damaged due to viral or bacterial infections. One common bacterial infection is the Helicobacter pylori infection. Gastritis can also result from excessive alcohol consumption, taking certain medicines, or having too much acid in the stomach.  SYMPTOMS  In some cases, there are no symptoms. When symptoms are present, they may include:  Pain or a burning sensation in the upper abdomen.  Nausea.  Vomiting.  An uncomfortable feeling of fullness after eating. DIAGNOSIS  Your caregiver may suspect you have gastritis based on your symptoms and a physical exam. To determine the cause of your gastritis, your caregiver may perform the following:  Blood or stool tests to check for the H pylori bacterium.  Gastroscopy. A thin, flexible tube (endoscope) is passed down the esophagus and into the stomach. The endoscope has a light and camera on the end. Your caregiver uses the endoscope to view the inside of the stomach.  Taking a tissue sample (biopsy) from the stomach to examine under a microscope. TREATMENT  Depending on the cause of your gastritis, medicines may be prescribed. If you have a bacterial infection, such as an H pylori infection, antibiotics may be given. If your gastritis is caused by too much acid in the stomach, H2 blockers or antacids may be given. Your caregiver may recommend that you stop taking aspirin, ibuprofen, or other nonsteroidal anti-inflammatory drugs (NSAIDs). HOME CARE INSTRUCTIONS  Only take over-the-counter or prescription medicines as  directed by your caregiver.  If you were given antibiotic medicines, take them as directed. Finish them even if you start to feel better.  Drink enough fluids to keep your urine clear or pale yellow.  Avoid foods and drinks that make your symptoms worse, such as:  Caffeine or alcoholic drinks.  Chocolate.  Peppermint or mint flavorings.  Garlic and onions.  Spicy foods.  Citrus fruits, such as oranges, lemons, or limes.  Tomato-based foods such as sauce, chili, salsa, and pizza.  Fried and fatty foods.  Eat small, frequent meals instead of large meals. SEEK IMMEDIATE MEDICAL CARE IF:   You have black or dark red stools.  You vomit blood or material that looks like coffee grounds.  You are unable to keep fluids down.  Your abdominal pain gets worse.  You have a fever.  You do not feel better after 1 week.  You have any other questions or concerns. MAKE SURE YOU:  Understand these instructions.  Will watch your condition.  Will get help right away if you are not doing well or get worse. Document Released: 04/07/2001 Document Revised: 10/13/2011 Document Reviewed: 05/27/2011 [facility] Patient Information 2015 [place], [place]. This information is not intended to replace advice given to you by your health care provider. Make sure you discuss any questions you have with your health care provider.

## 2014-09-04 NOTE — ED Provider Notes (Signed)
CSN: [id number]     Arrival date & time 09/04/14  1326 History   First MD Initiated Contact with Patient 09/04/14 1333     Chief Complaint  Patient presents with  . Emesis     (Consider location/radiation/quality/duration/timing/severity/associated sxs/prior Treatment) HPI The patient reports that she vomits when she eats. She states she is not having really any diarrhea at this point. That had been part of the chief complaint on her prior visit. She does not have abdominal pain. The patient poor she's had several weeks of general fatigue. She did see her doctor yesterday and had lab work done. She denies any medications or prescribed. She does not have any localizing pain. Past Medical History  Diagnosis Date  . Anxiety   . Mental disorder   . Depression   . Cognitive deficits   . Bipolar 1 disorder    Past Surgical History  Procedure Laterality Date  . Cesarean section    . Tonsillectomy N/A 06/03/2012    Procedure: TONSILLECTOMY;  Surgeon: [name], MD;  Location: [facility];  Service: ENT;  Laterality: N/A;  . Mass excision N/A 06/03/2012    Procedure: EXCISION MASS;  Surgeon: [name], MD;  Location: [facility];  Service: ENT;  Laterality: N/A;  Excision uvula mass  . Cesarean section N/A 04/25/2013    Procedure: REPEAT CESAREAN SECTION;  Surgeon: [name], MD;  Location: [facility];  Service: Obstetrics;  Laterality: N/A;  . Tonsillectomy     Family History  Problem Relation Age of Onset  . Hypertension Mother   . Diabetes Father    History  Substance Use Topics  . Smoking status: Current Every Day Smoker -- 7 years    Types: Cigarettes  . Smokeless tobacco: Never Used  . Alcohol Use: No   OB History    Gravida Para Term Preterm AB TAB SAB Ectopic Multiple Living   3 3 3  0 0 0 0 0 0 3     Review of Systems 10 Systems reviewed and are negative for acute change except as noted in the HPI.    Allergies   Penicillins  Home Medications   Prior to Admission medications   Medication Sig Start Date End Date Taking? Authorizing Provider  DULoxetine (CYMBALTA) 60 MG capsule Take 60 mg by mouth daily.    Historical Provider, MD  famotidine (PEPCID) 20 MG tablet Take 1 tablet (20 mg total) by mouth 2 (two) times daily. 09/04/14   Charlesetta Shanks, MD  OVER THE COUNTER MEDICATION Take 1 tablet by mouth 2 (two) times daily. OTC diet pill    Historical Provider, MD  paliperidone (INVEGA SUSTENNA) 234 MG/1.5ML SUSP injection Inject 234 mg into the muscle every 30 (thirty) days.    Historical Provider, MD   BP 111/73 mmHg  Pulse 102  Temp(Src) 97.8 F (36.6 C) (Oral)  Resp 18  SpO2 99% Physical Exam  Constitutional: She is oriented to person, place, and time.  Patient is morbidly obese. She is nontoxic alert. She is well in appearance.  HENT:  Head: Normocephalic and atraumatic.  Eyes: EOM are normal. Pupils are equal, round, and reactive to light.  Neck: Neck supple.  Cardiovascular: Normal rate, regular rhythm, normal heart sounds and intact distal pulses.   Pulmonary/Chest: Effort normal and breath sounds normal.  Abdominal: Soft. Bowel sounds are normal. She exhibits no distension. There is no tenderness.  Musculoskeletal: Normal range of motion. She exhibits no edema.  Neurological: She is  alert and oriented to person, place, and time. She has normal strength. Coordination normal. GCS eye subscore is 4. GCS verbal subscore is 5. GCS motor subscore is 6.  Skin: Skin is warm, dry and intact.  Psychiatric: She has a normal mood and affect.    ED Course  Procedures (including critical care time) Labs Review Labs Reviewed  URINALYSIS, ROUTINE W REFLEX MICROSCOPIC  URINE RAPID DRUG SCREEN (HOSP PERFORMED)  POC URINE PREG, ED    Imaging Review No results found.   EKG Interpretation None      MDM   Final diagnoses:  Non-intractable vomiting without nausea, vomiting of unspecified  type  Other fatigue   At this time the patient does not appear to have dehydration or abdominal pain. She had been seen previously for complaints of vomiting and diarrhea. Clinically she is not showing any signs of dehydration. There've been several sets of labs done over the past week. By her description she is no longer having any fluid loss through diarrhea, and only vomiting after she eats. This does not have pain associated with that to suggest cholecystitis. She denies that any specific foods or triggering the episodes. At this time symptoms are more suggestive of gastritis. I will add Pepcid and have the patient follow-up with her family doctor this week her response to therapy and further diagnostic testing if indicated.    Charlesetta Shanks, MD 09/04/14 1501

## 2014-09-04 NOTE — ED Notes (Signed)
Bed: [facility]A Expected date:  Expected time:  Means of arrival:  Comments: EMS  

## 2014-09-10 ENCOUNTER — Emergency Department (HOSPITAL_COMMUNITY)
Admission: EM | Admit: 2014-09-10 | Discharge: 2014-09-10 | Disposition: A | Discharge status: Home / Self Care | Payer: Medicaid Other | Attending: Emergency Medicine | Admitting: Emergency Medicine

## 2014-09-10 ENCOUNTER — Encounter (HOSPITAL_COMMUNITY): Payer: Self-pay | Admitting: *Deleted

## 2014-09-10 DIAGNOSIS — R197 Diarrhea, unspecified: Secondary | ICD-10-CM | POA: Insufficient documentation

## 2014-09-10 DIAGNOSIS — R109 Unspecified abdominal pain: Secondary | ICD-10-CM | POA: Insufficient documentation

## 2014-09-10 DIAGNOSIS — Z880 Allergy status to penicillin: Secondary | ICD-10-CM | POA: Insufficient documentation

## 2014-09-10 DIAGNOSIS — Z3202 Encounter for pregnancy test, result negative: Secondary | ICD-10-CM | POA: Diagnosis not present

## 2014-09-10 DIAGNOSIS — F319 Bipolar disorder, unspecified: Secondary | ICD-10-CM | POA: Diagnosis not present

## 2014-09-10 DIAGNOSIS — F419 Anxiety disorder, unspecified: Secondary | ICD-10-CM | POA: Diagnosis not present

## 2014-09-10 DIAGNOSIS — Z79899 Other long term (current) drug therapy: Secondary | ICD-10-CM | POA: Insufficient documentation

## 2014-09-10 DIAGNOSIS — Z720 Tobacco use: Secondary | ICD-10-CM | POA: Insufficient documentation

## 2014-09-10 DIAGNOSIS — R112 Nausea with vomiting, unspecified: Secondary | ICD-10-CM | POA: Insufficient documentation

## 2014-09-10 LAB — CBC WITH DIFFERENTIAL/PLATELET
BASOS ABS: 0 10*3/uL (ref 0.0–0.1)
Basophils Relative: 1 % (ref 0–1)
EOS PCT: 4 % (ref 0–5)
Eosinophils Absolute: 0.4 10*3/uL (ref 0.0–0.7)
HEMATOCRIT: 37.7 % (ref 36.0–46.0)
Hemoglobin: 12.4 g/dL (ref 12.0–15.0)
LYMPHS PCT: 35 % (ref 12–46)
Lymphs Abs: 3.1 10*3/uL (ref 0.7–4.0)
MCH: 26.5 pg (ref 26.0–34.0)
MCHC: 32.9 g/dL (ref 30.0–36.0)
MCV: 80.6 fL (ref 78.0–100.0)
Monocytes Absolute: 0.4 10*3/uL (ref 0.1–1.0)
Monocytes Relative: 5 % (ref 3–12)
NEUTROS PCT: 55 % (ref 43–77)
Neutro Abs: 4.9 10*3/uL (ref 1.7–7.7)
PLATELETS: 208 10*3/uL (ref 150–400)
RBC: 4.68 MIL/uL (ref 3.87–5.11)
RDW: 14.2 % (ref 11.5–15.5)
WBC: 8.8 10*3/uL (ref 4.0–10.5)

## 2014-09-10 LAB — COMPREHENSIVE METABOLIC PANEL
ALT: 27 U/L (ref 14–54)
AST: 29 U/L (ref 15–41)
Albumin: 3.9 g/dL (ref 3.5–5.0)
Alkaline Phosphatase: 63 U/L (ref 38–126)
Anion gap: 11 (ref 5–15)
BUN: 10 mg/dL (ref 6–20)
CO2: 22 mmol/L (ref 22–32)
CREATININE: 0.91 mg/dL (ref 0.44–1.00)
Calcium: 9.4 mg/dL (ref 8.9–10.3)
Chloride: 107 mmol/L (ref 101–111)
GFR calc Af Amer: >60 mL/min (ref >60)
GFR calc non Af Amer: >60 mL/min (ref >60)
GLUCOSE: 160 mg/dL — AB (ref 65–99)
Potassium: 3.7 mmol/L (ref 3.5–5.1)
Sodium: 140 mmol/L (ref 135–145)
TOTAL PROTEIN: 6.9 g/dL (ref 6.5–8.1)
Total Bilirubin: 0.6 mg/dL (ref 0.3–1.2)

## 2014-09-10 LAB — URINALYSIS, ROUTINE W REFLEX MICROSCOPIC
BILIRUBIN URINE: NEGATIVE
Glucose, UA: NEGATIVE mg/dL
HGB URINE DIPSTICK: NEGATIVE
Ketones, ur: NEGATIVE mg/dL
Leukocytes, UA: NEGATIVE
Nitrite: NEGATIVE
Protein, ur: NEGATIVE mg/dL
SPECIFIC GRAVITY, URINE: 1.023 (ref 1.005–1.030)
Urobilinogen, UA: 0.2 mg/dL (ref 0.0–1.0)
pH: 5 (ref 5.0–8.0)

## 2014-09-10 LAB — LIPASE, BLOOD: Lipase: 29 U/L (ref 22–51)

## 2014-09-10 LAB — POC URINE PREG, ED: Preg Test, Ur: NEGATIVE

## 2014-09-10 MED ORDER — FAMOTIDINE 20 MG PO TABS: 20.0000 mg | ORAL_TABLET | Freq: Two times a day (BID) | ORAL | Status: DC

## 2014-09-10 MED ORDER — PANTOPRAZOLE SODIUM 20 MG PO TBEC: 20.0000 mg | DELAYED_RELEASE_TABLET | Freq: Every day | ORAL | Status: DC

## 2014-09-10 NOTE — ED Provider Notes (Signed)
CSN: [id number]     Arrival date & time 09/10/14  1639 History   First MD Initiated Contact with Patient 09/10/14 1955     Chief Complaint  Patient presents with  . Abdominal Pain     (Consider location/radiation/quality/duration/timing/severity/associated sxs/prior Treatment) HPI [name] is a 24 y.o. female with hx of anxiety, bipolar disorder, cognitive deficits, presents to ED with complaint of abdominal pain for several months and vomiting and diarrhea for 3 weeks.  Pt states she has not seen anyone for this or taken any medications. States abdominal pain only after vomiting. Reports she is nauseated and vomiting ONLY after eating. Denies abdominal pain after eating. Denies fever or chills. States no pain at this time. No urinary symptoms. No vaginal discharge or bleeding. No medications tried prior to coming in. Patient currently follows up with [facility]. Has not seen them for days.  Past Medical History  Diagnosis Date  . Anxiety   . Mental disorder   . Depression   . Cognitive deficits   . Bipolar 1 disorder    Past Surgical History  Procedure Laterality Date  . Cesarean section    . Tonsillectomy N/A 06/03/2012    Procedure: TONSILLECTOMY;  Surgeon: [name], MD;  Location: [place];  Service: ENT;  Laterality: N/A;  . Mass excision N/A 06/03/2012    Procedure: EXCISION MASS;  Surgeon: [name], MD;  Location: [place];  Service: ENT;  Laterality: N/A;  Excision uvula mass  . Cesarean section N/A 04/25/2013    Procedure: REPEAT CESAREAN SECTION;  Surgeon: [name], MD;  Location: [facility];  Service: Obstetrics;  Laterality: N/A;  . Tonsillectomy     Family History  Problem Relation Age of Onset  . Hypertension Mother   . Diabetes Father    History  Substance Use Topics  . Smoking status: Current Every Day Smoker -- 7 years    Types: Cigarettes  . Smokeless tobacco: Never Used  . Alcohol Use: No    OB History    Gravida Para Term Preterm AB TAB SAB Ectopic Multiple Living   3 3 3  0 0 0 0 0 0 3     Review of Systems  Constitutional: Negative for fever and chills.  Respiratory: Negative for cough, chest tightness and shortness of breath.   Cardiovascular: Negative for chest pain, palpitations and leg swelling.  Gastrointestinal: Positive for nausea, vomiting, abdominal pain and diarrhea.  Genitourinary: Negative for dysuria, flank pain, vaginal bleeding, vaginal discharge, vaginal pain and pelvic pain.  Musculoskeletal: Negative for myalgias, arthralgias, neck pain and neck stiffness.  Skin: Negative for rash.  Neurological: Negative for dizziness, weakness and headaches.  All other systems reviewed and are negative.     Allergies  Penicillins  Home Medications   Prior to Admission medications   Medication Sig Start Date End Date Taking? Authorizing Provider  DULoxetine (CYMBALTA) 60 MG capsule Take 60 mg by mouth daily.    Historical Provider, MD  famotidine (PEPCID) 20 MG tablet Take 1 tablet (20 mg total) by mouth 2 (two) times daily. 09/04/14   Charlesetta Shanks, MD  OVER THE COUNTER MEDICATION Take 1 tablet by mouth 2 (two) times daily. OTC diet pill    Historical Provider, MD  paliperidone (INVEGA SUSTENNA) 234 MG/1.5ML SUSP injection Inject 234 mg into the muscle every 30 (thirty) days.    Historical Provider, MD   BP 124/83 mmHg  Pulse 106  Temp(Src) 98 F (36.7  C) (Oral)  Resp 16  Ht 5\' 7"  (1.702 m)  Wt 266 lb 4 oz (120.77 kg)  BMI 41.69 kg/m2  SpO2 98% Physical Exam  Constitutional: She is oriented to person, place, and time. She appears well-developed and well-nourished. No distress.  HENT:  Head: Normocephalic.  Eyes: Conjunctivae are normal.  Neck: Neck supple.  Cardiovascular: Normal rate, regular rhythm and normal heart sounds.   Pulmonary/Chest: Effort normal and breath sounds normal. No respiratory distress. She has no wheezes. She has no rales.   Abdominal: Soft. Bowel sounds are normal. She exhibits no distension. There is no tenderness. There is no rebound.  No tenderness on examination, no CVA tenderness bilaterally  Musculoskeletal: She exhibits no edema.  Neurological: She is alert and oriented to person, place, and time.  Skin: Skin is warm and dry.  Psychiatric: She has a normal mood and affect. Her behavior is normal.  Nursing note and vitals reviewed.   ED Course  Procedures (including critical care time) Labs Review Labs Reviewed  COMPREHENSIVE METABOLIC PANEL - Abnormal; Notable for the following:    Glucose, Bld 160 (*)    All other components within normal limits  CBC WITH DIFFERENTIAL/PLATELET  LIPASE, BLOOD  URINALYSIS, ROUTINE W REFLEX MICROSCOPIC  POC URINE PREG, ED    Imaging Review No results found.   EKG Interpretation None      MDM   Final diagnoses:  Abdominal pain, unspecified abdominal location  Non-intractable vomiting with nausea, vomiting of unspecified type    patient with occasional postprandial vomiting, sometimes has pain after vomiting only. Currently asymptomatic. She's tolerating fluids in emergency department. Lab work obtained unremarkable except for slightly elevated glucose level at 160. Elevation white count. Abdomen is benign. Do not think she needs any further imaging. We'll start on carafate, PPI, follow up with primary care doctor.  Filed Vitals:   09/10/14 1645  BP: 124/83  Pulse: 106  Temp: 98 F (36.7 C)  TempSrc: Oral  Resp: 16  Height: 5\' 7"  (1.702 m)  Weight: 266 lb 4 oz (120.77 kg)  SpO2: 98%       Jeannett Senior, PA-C 09/11/14 0103  Daleen Bo, MD 09/12/14 [phone]

## 2014-09-10 NOTE — ED Notes (Signed)
Pt reports she has felt weak for several months. Also reports nausea vomiting and diarrhea for two weeks. States she was seen for this a week ago and was given nausea medication but never got her prescription filled. Denies abdominal pain at present.

## 2014-09-10 NOTE — Discharge Instructions (Signed)
Take pepcid and protonix daily. Avoid spicy or tomato based foods. Follow up with primary care doctor in 1 week for recheck. Return if worsening symptoms.   Gastroesophageal Reflux Disease, Adult Gastroesophageal reflux disease (GERD) happens when acid from your stomach flows up into the esophagus. When acid comes in contact with the esophagus, the acid causes soreness (inflammation) in the esophagus. Over time, GERD may create small holes (ulcers) in the lining of the esophagus. CAUSES   Increased body weight. This puts pressure on the stomach, making acid rise from the stomach into the esophagus.  Smoking. This increases acid production in the stomach.  Drinking alcohol. This causes decreased pressure in the lower esophageal sphincter (valve or ring of muscle between the esophagus and stomach), allowing acid from the stomach into the esophagus.  Late evening meals and a full stomach. This increases pressure and acid production in the stomach.  A malformed lower esophageal sphincter. Sometimes, no cause is found. SYMPTOMS   Burning pain in the lower part of the mid-chest behind the breastbone and in the mid-stomach area. This may occur twice a week or more often.  Trouble swallowing.  Sore throat.  Dry cough.  Asthma-like symptoms including chest tightness, shortness of breath, or wheezing. DIAGNOSIS  Your caregiver may be able to diagnose GERD based on your symptoms. In some cases, X-rays and other tests may be done to check for complications or to check the condition of your stomach and esophagus. TREATMENT  Your caregiver may recommend over-the-counter or prescription medicines to help decrease acid production. Ask your caregiver before starting or adding any new medicines.  HOME CARE INSTRUCTIONS   Change the factors that you can control. Ask your caregiver for guidance concerning weight loss, quitting smoking, and alcohol consumption.  Avoid foods and drinks that make your  symptoms worse, such as:  Caffeine or alcoholic drinks.  Chocolate.  Peppermint or mint flavorings.  Garlic and onions.  Spicy foods.  Citrus fruits, such as oranges, lemons, or limes.  Tomato-based foods such as sauce, chili, salsa, and pizza.  Fried and fatty foods.  Avoid lying down for the 3 hours prior to your bedtime or prior to taking a nap.  Eat small, frequent meals instead of large meals.  Wear loose-fitting clothing. Do not wear anything tight around your waist that causes pressure on your stomach.  Raise the head of your bed 6 to 8 inches with wood blocks to help you sleep. Extra pillows will not help.  Only take over-the-counter or prescription medicines for pain, discomfort, or fever as directed by your caregiver.  Do not take aspirin, ibuprofen, or other nonsteroidal anti-inflammatory drugs (NSAIDs). SEEK IMMEDIATE MEDICAL CARE IF:   You have pain in your arms, neck, jaw, teeth, or back.  Your pain increases or changes in intensity or duration.  You develop nausea, vomiting, or sweating (diaphoresis).  You develop shortness of breath, or you faint.  Your vomit is green, yellow, black, or looks like coffee grounds or blood.  Your stool is red, bloody, or black. These symptoms could be signs of other problems, such as heart disease, gastric bleeding, or esophageal bleeding. MAKE SURE YOU:   Understand these instructions.  Will watch your condition.  Will get help right away if you are not doing well or get worse. Document Released: 01/21/2005 Document Revised: 07/06/2011 Document Reviewed: 10/31/2010 [facility] Patient Information 2015 [place], [place]. This information is not intended to replace advice given to you by your health care provider. Make  sure you discuss any questions you have with your health care provider.

## 2014-09-10 NOTE — ED Notes (Signed)
EMS from home. C/o abdpain/N/V/D x 2 weeks. Was seen at [facility] for same. RX given from [facility] that was not filled. Pt has appt with PCP tomorrow. CBG 142. Pt states that she vomits after eating and feels better.

## 2014-10-17 ENCOUNTER — Emergency Department (HOSPITAL_COMMUNITY)
Admission: EM | Admit: 2014-10-17 | Discharge: 2014-10-17 | Disposition: A | Discharge status: Home / Self Care | Payer: Medicaid Other | Attending: Emergency Medicine | Admitting: Emergency Medicine

## 2014-10-17 ENCOUNTER — Encounter (HOSPITAL_COMMUNITY): Payer: Self-pay

## 2014-10-17 DIAGNOSIS — L293 Anogenital pruritus, unspecified: Secondary | ICD-10-CM | POA: Insufficient documentation

## 2014-10-17 DIAGNOSIS — Z3202 Encounter for pregnancy test, result negative: Secondary | ICD-10-CM | POA: Insufficient documentation

## 2014-10-17 DIAGNOSIS — F319 Bipolar disorder, unspecified: Secondary | ICD-10-CM | POA: Diagnosis not present

## 2014-10-17 DIAGNOSIS — R110 Nausea: Secondary | ICD-10-CM | POA: Diagnosis not present

## 2014-10-17 DIAGNOSIS — Z880 Allergy status to penicillin: Secondary | ICD-10-CM | POA: Insufficient documentation

## 2014-10-17 DIAGNOSIS — F419 Anxiety disorder, unspecified: Secondary | ICD-10-CM | POA: Diagnosis not present

## 2014-10-17 DIAGNOSIS — E119 Type 2 diabetes mellitus without complications: Secondary | ICD-10-CM | POA: Insufficient documentation

## 2014-10-17 DIAGNOSIS — Z8619 Personal history of other infectious and parasitic diseases: Secondary | ICD-10-CM | POA: Diagnosis not present

## 2014-10-17 DIAGNOSIS — Z79899 Other long term (current) drug therapy: Secondary | ICD-10-CM | POA: Insufficient documentation

## 2014-10-17 DIAGNOSIS — Z720 Tobacco use: Secondary | ICD-10-CM | POA: Insufficient documentation

## 2014-10-17 DIAGNOSIS — F99 Mental disorder, not otherwise specified: Secondary | ICD-10-CM | POA: Diagnosis not present

## 2014-10-17 DIAGNOSIS — N898 Other specified noninflammatory disorders of vagina: Secondary | ICD-10-CM | POA: Insufficient documentation

## 2014-10-17 LAB — URINE MICROSCOPIC-ADD ON

## 2014-10-17 LAB — URINALYSIS, ROUTINE W REFLEX MICROSCOPIC
Bilirubin Urine: NEGATIVE
GLUCOSE, UA: 250 mg/dL — AB
Ketones, ur: NEGATIVE mg/dL
LEUKOCYTES UA: NEGATIVE
NITRITE: NEGATIVE
PROTEIN: NEGATIVE mg/dL
SPECIFIC GRAVITY, URINE: 1.034 — AB (ref 1.005–1.030)
UROBILINOGEN UA: 0.2 mg/dL (ref 0.0–1.0)
pH: 5.5 (ref 5.0–8.0)

## 2014-10-17 LAB — WET PREP, GENITAL
Clue Cells Wet Prep HPF POC: NONE SEEN
Trich, Wet Prep: NONE SEEN
Yeast Wet Prep HPF POC: NONE SEEN

## 2014-10-17 LAB — PREGNANCY, URINE: Preg Test, Ur: NEGATIVE

## 2014-10-17 MED ORDER — AZITHROMYCIN 250 MG PO TABS
1000.0000 mg | ORAL_TABLET | Freq: Once | ORAL | Status: AC
  Administered 2014-10-17: 1000 mg via ORAL
  Filled 2014-10-17: qty 4

## 2014-10-17 MED ORDER — STERILE WATER FOR INJECTION IJ SOLN
INTRAMUSCULAR | Status: AC
  Administered 2014-10-17: 10 mL
  Filled 2014-10-17: qty 10

## 2014-10-17 MED ORDER — CEFTRIAXONE SODIUM 250 MG IJ SOLR
250.0000 mg | Freq: Once | INTRAMUSCULAR | Status: AC
  Administered 2014-10-17: 250 mg via INTRAMUSCULAR
  Filled 2014-10-17: qty 250

## 2014-10-17 NOTE — ED Notes (Addendum)
Patient c/o nausea x 1 week, vaginal itching and burning x 2 days.patient states she has been having spotting at times. Patient denies any fever, abdominal pain, or vaginal discharge.

## 2014-10-17 NOTE — ED Provider Notes (Signed)
CSN: [id number]     Arrival date & time 10/17/14  1056 History   First MD Initiated Contact with Patient 10/17/14 1129     Chief Complaint  Patient presents with  . Vaginal Itching  . Nausea   HPI   24 year old female presents today with vaginal itching and discharge. Patient reports over the last 2 days she's began to experience these symptoms, reports that last week she had sex with a female partner uncertain if he is experiencing similar complaints. Past medical history of STD infections, no recent antibiotic exposure, type II diabetic self-reported well-controlled. She denies fever, chills, nausea, vomiting, abdominal pain, back pain, lower extremity swelling or edema. She denies any urinary complaints. Nursing notes report nausea, patient reports that she intermittently feels nausea not related to current symptoms.   Past Medical History  Diagnosis Date  . Anxiety   . Mental disorder   . Depression   . Cognitive deficits   . Bipolar 1 disorder    Past Surgical History  Procedure Laterality Date  . Cesarean section    . Tonsillectomy N/A 06/03/2012    Procedure: TONSILLECTOMY;  Surgeon: [name], MD;  Location: [place];  Service: ENT;  Laterality: N/A;  . Mass excision N/A 06/03/2012    Procedure: EXCISION MASS;  Surgeon: [name], MD;  Location: [place];  Service: ENT;  Laterality: N/A;  Excision uvula mass  . Cesarean section N/A 04/25/2013    Procedure: REPEAT CESAREAN SECTION;  Surgeon: [name], MD;  Location: [facility];  Service: Obstetrics;  Laterality: N/A;  . Tonsillectomy     Family History  Problem Relation Age of Onset  . Hypertension Mother   . Diabetes Father    History  Substance Use Topics  . Smoking status: Current Some Day Smoker -- 7 years    Types: Cigarettes  . Smokeless tobacco: Never Used  . Alcohol Use: No   OB History    Gravida Para Term Preterm AB TAB SAB Ectopic Multiple Living   3 3 3  0 0 0 0  0 0 3     Review of Systems  All other systems reviewed and are negative.   Allergies  Penicillins  Home Medications   Prior to Admission medications   Medication Sig Start Date End Date Taking? Authorizing Provider  DULoxetine (CYMBALTA) 60 MG capsule Take 60 mg by mouth daily.   Yes Historical Provider, MD  GLIPIZIDE XL 5 MG 24 hr tablet Take 5 mg by mouth daily. 10/12/14  Yes Historical Provider, MD  metFORMIN (GLUCOPHAGE) 500 MG tablet Take 500 mg by mouth 2 (two) times daily. 10/12/14  Yes Historical Provider, MD  famotidine (PEPCID) 20 MG tablet Take 1 tablet (20 mg total) by mouth 2 (two) times daily. Patient not taking: Reported on 10/17/2014 09/10/14   Tatyana Kirichenko, PA-C  pantoprazole (PROTONIX) 20 MG tablet Take 1 tablet (20 mg total) by mouth daily. Patient not taking: Reported on 10/17/2014 09/10/14   Tatyana Kirichenko, PA-C   BP 118/71 mmHg  Pulse 92  Temp(Src) 98.2 F (36.8 C) (Oral)  Resp 14  SpO2 97%  Breastfeeding? No   Physical Exam  Constitutional: She is oriented to person, place, and time. She appears well-developed and well-nourished.  HENT:  Head: Normocephalic and atraumatic.  Eyes: Conjunctivae are normal. Pupils are equal, round, and reactive to light. Right eye exhibits no discharge. Left eye exhibits no discharge. No scleral icterus.  Neck: Normal range of motion. No  JVD present. No tracheal deviation present.  Cardiovascular: Regular rhythm and normal heart sounds.   Pulmonary/Chest: Effort normal. No stridor.  Abdominal: Soft. She exhibits no distension and no mass. There is no tenderness. There is no rebound and no guarding.  Genitourinary: Uterus normal. There is no rash, tenderness, lesion or injury on the right labia. There is no rash, tenderness, lesion or injury on the left labia. Cervix exhibits discharge. Cervix exhibits no motion tenderness and no friability. Right adnexum displays no mass and no tenderness. Left adnexum displays no mass  and no tenderness. No erythema, tenderness or bleeding in the vagina. No foreign body around the vagina. No signs of injury around the vagina. Vaginal discharge found.  Neurological: She is alert and oriented to person, place, and time. Coordination normal.  Psychiatric: She has a normal mood and affect. Her behavior is normal. Judgment and thought content normal.  Nursing note and vitals reviewed.   ED Course  Procedures (including critical care time) Labs Review Labs Reviewed  WET PREP, GENITAL - Abnormal; Notable for the following:    WBC, Wet Prep HPF POC FEW (*)    All other components within normal limits  URINALYSIS, ROUTINE W REFLEX MICROSCOPIC (NOT AT [facility]) - Abnormal; Notable for the following:    APPearance TURBID (*)    Specific Gravity, Urine 1.034 (*)    Glucose, UA 250 (*)    Hgb urine dipstick TRACE (*)    All other components within normal limits  URINE MICROSCOPIC-ADD ON - Abnormal; Notable for the following:    Squamous Epithelial / LPF FEW (*)    Bacteria, UA MANY (*)    Crystals CA OXALATE CRYSTALS (*)    All other components within normal limits  PREGNANCY, URINE  RPR  HIV ANTIBODY (ROUTINE TESTING)  GC/CHLAMYDIA PROBE AMP ([place]) NOT AT [facility]    Imaging Review No results found.   EKG Interpretation None      MDM   Final diagnoses:  Vaginal itching    Labs: Wet prep, urinalysis, RPR, HIV, pregnancy urine-significant for few WBCs, glucose 250 many bacteria, calcium oxalate crystals amorphus urates  Imaging:  Consults:  Therapeutics: Azithromycin, Rocephin  Assessment:  Vaginal itching  Plan: Patient presents with vaginal itching 2 days. History of unprotected sex and vaginal discharge. Patient prophylactically treated for gonorrhea Chlamydia. No signs of yeast infection. Patient denies any exposure to foreign material including condoms or sexual products. Uncertain etiology of patient's complaints if STDs not present on pending  laboratory data. Patient was instructed to follow-up with OB/GYN and/or primary care provider for further evaluation and management. Strict return precautions given, verbalized understanding and agreement for today's plan.      Okey Regal, PA-C 10/19/14 [place], MD 10/23/14 [phone]

## 2014-10-18 LAB — GC/CHLAMYDIA PROBE AMP (~~LOC~~) NOT AT ARMC
Chlamydia: NEGATIVE
Neisseria Gonorrhea: NEGATIVE

## 2014-10-18 LAB — HIV ANTIBODY (ROUTINE TESTING W REFLEX): HIV Screen 4th Generation wRfx: NONREACTIVE

## 2014-10-18 LAB — RPR: RPR Ser Ql: NONREACTIVE

## 2014-10-20 ENCOUNTER — Emergency Department (HOSPITAL_COMMUNITY)
Admission: EM | Admit: 2014-10-20 | Discharge: 2014-10-20 | Disposition: A | Discharge status: Home / Self Care | Payer: Medicaid Other | Attending: Emergency Medicine | Admitting: Emergency Medicine

## 2014-10-20 ENCOUNTER — Encounter (HOSPITAL_COMMUNITY): Payer: Self-pay | Admitting: Emergency Medicine

## 2014-10-20 DIAGNOSIS — F319 Bipolar disorder, unspecified: Secondary | ICD-10-CM | POA: Diagnosis not present

## 2014-10-20 DIAGNOSIS — Z880 Allergy status to penicillin: Secondary | ICD-10-CM | POA: Diagnosis not present

## 2014-10-20 DIAGNOSIS — Z79899 Other long term (current) drug therapy: Secondary | ICD-10-CM | POA: Insufficient documentation

## 2014-10-20 DIAGNOSIS — F419 Anxiety disorder, unspecified: Secondary | ICD-10-CM | POA: Diagnosis not present

## 2014-10-20 DIAGNOSIS — N898 Other specified noninflammatory disorders of vagina: Secondary | ICD-10-CM | POA: Diagnosis not present

## 2014-10-20 DIAGNOSIS — E119 Type 2 diabetes mellitus without complications: Secondary | ICD-10-CM | POA: Diagnosis not present

## 2014-10-20 DIAGNOSIS — Z720 Tobacco use: Secondary | ICD-10-CM | POA: Insufficient documentation

## 2014-10-20 HISTORY — DX: Type 2 diabetes mellitus without complications: E11.9

## 2014-10-20 LAB — WET PREP, GENITAL
CLUE CELLS WET PREP: NONE SEEN
TRICH WET PREP: NONE SEEN
Yeast Wet Prep HPF POC: NONE SEEN

## 2014-10-20 MED ORDER — FLUCONAZOLE 150 MG PO TABS
150.0000 mg | ORAL_TABLET | Freq: Every day | ORAL | Status: DC
  Administered 2014-10-20: 150 mg via ORAL
  Filled 2014-10-20: qty 1

## 2014-10-20 NOTE — ED Notes (Signed)
Pt from home via EMS. She states she has been having vaginal burning and itching x 5 days. She also has discharge that was brown this morning but previously was "like cottage cheese" and had a foul odor.

## 2014-10-20 NOTE — Discharge Instructions (Signed)

## 2014-10-20 NOTE — ED Provider Notes (Signed)
CSN: [id number]     Arrival date & time 10/20/14  0049 History   First MD Initiated Contact with Patient 10/20/14 [phone]     Chief Complaint  Patient presents with  . Vaginal Discharge     (Consider location/radiation/quality/duration/timing/severity/associated sxs/prior Treatment) Patient is a 24 y.o. female presenting with vaginal discharge. The history is provided by the patient. No language interpreter was used.  Vaginal Discharge Associated symptoms: no dysuria, no fever and no nausea   Associated symptoms comment:  She returns to the emergency room for further evaluation of vaginal discharge and vaginal itching. No abnormal bleeding. She denies dysuria, or urinary frequency. No fever. She was given medication when seen on 6/22 but reports continued symptoms.    Past Medical History  Diagnosis Date  . Anxiety   . Mental disorder   . Depression   . Cognitive deficits   . Bipolar 1 disorder   . Diabetes mellitus without complication    Past Surgical History  Procedure Laterality Date  . Cesarean section    . Tonsillectomy N/A 06/03/2012    Procedure: TONSILLECTOMY;  Surgeon: [name], MD;  Location: [place];  Service: ENT;  Laterality: N/A;  . Mass excision N/A 06/03/2012    Procedure: EXCISION MASS;  Surgeon: [name], MD;  Location: [place];  Service: ENT;  Laterality: N/A;  Excision uvula mass  . Cesarean section N/A 04/25/2013    Procedure: REPEAT CESAREAN SECTION;  Surgeon: [name], MD;  Location: [facility];  Service: Obstetrics;  Laterality: N/A;  . Tonsillectomy     Family History  Problem Relation Age of Onset  . Hypertension Mother   . Diabetes Father    History  Substance Use Topics  . Smoking status: Current Some Day Smoker -- 7 years    Types: Cigarettes  . Smokeless tobacco: Never Used  . Alcohol Use: No   OB History    Gravida Para Term Preterm AB TAB SAB Ectopic Multiple Living   3 3 3  0 0 0 0 0 0 3     Review of Systems  Constitutional: Negative for fever and chills.  Gastrointestinal: Negative.  Negative for nausea.  Genitourinary: Positive for vaginal discharge. Negative for dysuria.  Musculoskeletal: Negative.  Negative for myalgias.  Skin: Negative.   Neurological: Negative.       Allergies  Penicillins  Home Medications   Prior to Admission medications   Medication Sig Start Date End Date Taking? Authorizing Provider  DULoxetine (CYMBALTA) 60 MG capsule Take 60 mg by mouth daily.   Yes Historical Provider, MD  GLIPIZIDE XL 5 MG 24 hr tablet Take 5 mg by mouth daily. 10/12/14  Yes Historical Provider, MD  metFORMIN (GLUCOPHAGE) 500 MG tablet Take 500 mg by mouth 2 (two) times daily. 10/12/14  Yes Historical Provider, MD  famotidine (PEPCID) 20 MG tablet Take 1 tablet (20 mg total) by mouth 2 (two) times daily. Patient not taking: Reported on 10/17/2014 09/10/14   Tatyana Kirichenko, PA-C  pantoprazole (PROTONIX) 20 MG tablet Take 1 tablet (20 mg total) by mouth daily. Patient not taking: Reported on 10/17/2014 09/10/14   Tatyana Kirichenko, PA-C   BP 130/83 mmHg  Pulse 104  Temp(Src) 98.1 F (36.7 C) (Oral)  Resp 18  Ht 5\' 7"  (1.702 m)  Wt 271 lb (122.925 kg)  BMI 42.43 kg/m2  SpO2 97%  LMP 05/28/2014 (Approximate) Physical Exam  Constitutional: She is oriented to person, place, and time. She appears well-developed  and well-nourished.  HENT:  Head: Normocephalic.  Neck: Normal range of motion. Neck supple.  Cardiovascular: Normal rate and regular rhythm.   Pulmonary/Chest: Effort normal and breath sounds normal.  Abdominal: Soft. Bowel sounds are normal. There is no tenderness. There is no rebound and no guarding.  Genitourinary: Uterus normal. Vaginal discharge found.  No adnexal mass or tenderness. No CMT. There is a thick, "cottage cheese" vaginal discharge present without purulent appearing discharge.   Musculoskeletal: Normal range of motion.  Neurological: She  is alert and oriented to person, place, and time.  Skin: Skin is warm and dry. No rash noted.  Psychiatric: She has a normal mood and affect.    ED Course  Procedures (including critical care time) Labs Review Labs Reviewed  WET PREP, GENITAL - Abnormal; Notable for the following:    WBC, Wet Prep HPF POC FEW (*)    All other components within normal limits  GC/CHLAMYDIA PROBE AMP ([place]) NOT AT [facility]    Imaging Review No results found.   EKG Interpretation None      MDM   Final diagnoses:  None    1. Vaginal discharge.  There are symptoms of vaginal itching with discharge c/w yeast. Will treat presumptively with Diflucan and strongly encourage outpatient GYN or PCP follow up.    Charlann Lange, PA-C 10/20/14 0330  Linton Flemings, MD 10/20/14 [phone]

## 2014-10-20 NOTE — ED Notes (Signed)
Patient is alert and oriented x3.  She was given DC instructions and follow up visit instructions.  Patient gave verbal understanding. She was DC ambulatory under her own power to home.  V/S stable.  He was not showing any signs of distress on DC 

## 2014-10-22 LAB — GC/CHLAMYDIA PROBE AMP (~~LOC~~) NOT AT ARMC
Chlamydia: NEGATIVE
Neisseria Gonorrhea: NEGATIVE

## 2014-12-19 ENCOUNTER — Encounter (HOSPITAL_COMMUNITY): Payer: Self-pay

## 2014-12-19 ENCOUNTER — Emergency Department (HOSPITAL_COMMUNITY)
Admission: EM | Admit: 2014-12-19 | Discharge: 2014-12-20 | Disposition: A | Discharge status: Home / Self Care | Payer: Medicaid Other | Attending: Emergency Medicine | Admitting: Emergency Medicine

## 2014-12-19 DIAGNOSIS — Z720 Tobacco use: Secondary | ICD-10-CM | POA: Diagnosis not present

## 2014-12-19 DIAGNOSIS — Z79899 Other long term (current) drug therapy: Secondary | ICD-10-CM | POA: Insufficient documentation

## 2014-12-19 DIAGNOSIS — R45851 Suicidal ideations: Secondary | ICD-10-CM | POA: Diagnosis present

## 2014-12-19 DIAGNOSIS — F99 Mental disorder, not otherwise specified: Secondary | ICD-10-CM | POA: Diagnosis not present

## 2014-12-19 DIAGNOSIS — F419 Anxiety disorder, unspecified: Secondary | ICD-10-CM | POA: Diagnosis not present

## 2014-12-19 DIAGNOSIS — F319 Bipolar disorder, unspecified: Secondary | ICD-10-CM | POA: Diagnosis not present

## 2014-12-19 DIAGNOSIS — F32A Depression, unspecified: Secondary | ICD-10-CM

## 2014-12-19 DIAGNOSIS — E119 Type 2 diabetes mellitus without complications: Secondary | ICD-10-CM | POA: Insufficient documentation

## 2014-12-19 DIAGNOSIS — F411 Generalized anxiety disorder: Secondary | ICD-10-CM | POA: Diagnosis not present

## 2014-12-19 DIAGNOSIS — F329 Major depressive disorder, single episode, unspecified: Secondary | ICD-10-CM

## 2014-12-19 LAB — COMPREHENSIVE METABOLIC PANEL
ALT: 36 U/L (ref 14–54)
AST: 34 U/L (ref 15–41)
Albumin: 4.2 g/dL (ref 3.5–5.0)
Alkaline Phosphatase: 63 U/L (ref 38–126)
Anion gap: 7 (ref 5–15)
BUN: 9 mg/dL (ref 6–20)
CO2: 27 mmol/L (ref 22–32)
Calcium: 9.1 mg/dL (ref 8.9–10.3)
Chloride: 106 mmol/L (ref 101–111)
Creatinine, Ser: 0.81 mg/dL (ref 0.44–1.00)
GFR calc Af Amer: >60 mL/min (ref >60)
GFR calc non Af Amer: >60 mL/min (ref >60)
Glucose, Bld: 92 mg/dL (ref 65–99)
Potassium: 3.7 mmol/L (ref 3.5–5.1)
Sodium: 140 mmol/L (ref 135–145)
Total Bilirubin: 0.6 mg/dL (ref 0.3–1.2)
Total Protein: 7.7 g/dL (ref 6.5–8.1)

## 2014-12-19 LAB — RAPID URINE DRUG SCREEN, HOSP PERFORMED
AMPHETAMINES: NOT DETECTED
BARBITURATES: NOT DETECTED
BENZODIAZEPINES: NOT DETECTED
Cocaine: NOT DETECTED
Opiates: NOT DETECTED
TETRAHYDROCANNABINOL: NOT DETECTED

## 2014-12-19 LAB — CBC
HEMATOCRIT: 39.7 % (ref 36.0–46.0)
Hemoglobin: 12.7 g/dL (ref 12.0–15.0)
MCH: 26.5 pg (ref 26.0–34.0)
MCHC: 32 g/dL (ref 30.0–36.0)
MCV: 82.9 fL (ref 78.0–100.0)
Platelets: 221 10*3/uL (ref 150–400)
RBC: 4.79 MIL/uL (ref 3.87–5.11)
RDW: 14.5 % (ref 11.5–15.5)
WBC: 10.3 10*3/uL (ref 4.0–10.5)

## 2014-12-19 LAB — SALICYLATE LEVEL: Salicylate Lvl: <4 mg/dL (ref 2.8–30.0)

## 2014-12-19 LAB — ETHANOL: Alcohol, Ethyl (B): <5 mg/dL (ref <5)

## 2014-12-19 LAB — ACETAMINOPHEN LEVEL: Acetaminophen (Tylenol), Serum: <10 ug/mL — ABNORMAL LOW (ref 10–30)

## 2014-12-19 NOTE — ED Notes (Signed)
Pt states that she feels suicidal because she states that this bus driver that she likes is telling lies on her and she just went through a break up, she states that she has a lot going on and she doesn't want to live anymore
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
## 2014-12-19 NOTE — ED Notes (Signed)
Bed: LH73 Expected date:  Expected time:  Means of arrival:  Comments: TR3

## 2014-12-20 DIAGNOSIS — F411 Generalized anxiety disorder: Secondary | ICD-10-CM | POA: Diagnosis not present

## 2014-12-20 DIAGNOSIS — F329 Major depressive disorder, single episode, unspecified: Secondary | ICD-10-CM | POA: Insufficient documentation

## 2014-12-20 DIAGNOSIS — F32A Depression, unspecified: Secondary | ICD-10-CM | POA: Insufficient documentation

## 2014-12-20 LAB — CBG MONITORING, ED: Glucose-Capillary: 101 mg/dL — ABNORMAL HIGH (ref 65–99)

## 2014-12-20 MED ORDER — METFORMIN HCL 500 MG PO TABS
500.0000 mg | ORAL_TABLET | Freq: Two times a day (BID) | ORAL | Status: DC
  Administered 2014-12-20: 500 mg via ORAL
  Filled 2014-12-20 (×3): qty 1

## 2014-12-20 MED ORDER — NICOTINE 21 MG/24HR TD PT24: 21.0000 mg | MEDICATED_PATCH | Freq: Every day | TRANSDERMAL | Status: DC

## 2014-12-20 MED ORDER — ONDANSETRON HCL 4 MG PO TABS: 4.0000 mg | ORAL_TABLET | Freq: Three times a day (TID) | ORAL | Status: DC | PRN

## 2014-12-20 MED ORDER — IBUPROFEN 200 MG PO TABS: 600.0000 mg | ORAL_TABLET | Freq: Three times a day (TID) | ORAL | Status: DC | PRN

## 2014-12-20 MED ORDER — DULOXETINE HCL 60 MG PO CPEP
60.0000 mg | ORAL_CAPSULE | Freq: Every day | ORAL | Status: DC
  Administered 2014-12-20: 60 mg via ORAL
  Filled 2014-12-20: qty 1

## 2014-12-20 MED ORDER — LORAZEPAM 1 MG PO TABS: 1.0000 mg | ORAL_TABLET | Freq: Three times a day (TID) | ORAL | Status: DC | PRN

## 2014-12-20 MED ORDER — NICOTINE 21 MG/24HR TD PT24
21.0000 mg | MEDICATED_PATCH | Freq: Every day | TRANSDERMAL | Status: DC
  Administered 2014-12-20: 21 mg via TRANSDERMAL
  Filled 2014-12-20: qty 1

## 2014-12-20 MED ORDER — ETONOGESTREL 68 MG ~~LOC~~ IMPL: 1.0000 | DRUG_IMPLANT | Freq: Once | SUBCUTANEOUS | Status: DC

## 2014-12-20 MED ORDER — LURASIDONE HCL 40 MG PO TABS
40.0000 mg | ORAL_TABLET | Freq: Every day | ORAL | Status: DC
  Administered 2014-12-20: 40 mg via ORAL
  Filled 2014-12-20 (×2): qty 1

## 2014-12-20 MED ORDER — ZOLPIDEM TARTRATE 10 MG PO TABS: 10.0000 mg | ORAL_TABLET | Freq: Every evening | ORAL | Status: DC | PRN

## 2014-12-20 MED ORDER — ALUM & MAG HYDROXIDE-SIMETH 200-200-20 MG/5ML PO SUSP: 30.0000 mL | ORAL | Status: DC | PRN

## 2014-12-20 MED ORDER — ACETAMINOPHEN 325 MG PO TABS: 650.0000 mg | ORAL_TABLET | ORAL | Status: DC | PRN
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
## 2014-12-20 NOTE — BHH Suicide Risk Assessment (Cosign Needed)
Suicide Risk Assessment  Discharge Assessment   [facility] Discharge Suicide Risk Assessment   Demographic Factors:  Low socioeconomic status and Living alone  Total Time spent with patient: 20 minutes  Musculoskeletal: Strength & Muscle Tone: within normal limits Gait & Station: normal Patient leans: N/A  Psychiatric Specialty Exam:     Blood pressure 130/67, pulse 76, temperature 99 F (37.2 C), temperature source Oral, resp. rate 18, SpO2 98 %, not currently breastfeeding.There is no weight on file to calculate BMI.        Has this patient used any form of tobacco in the last 30 days? (Cigarettes, Smokeless Tobacco, Cigars, and/or Pipes) Yes, Prescription not provided because: offered prescription and accepted  Mental Status Per Nursing Assessment::   On Admission:     Current Mental Status by Physician: NA  Loss Factors: NA  Historical Factors: Prior suicide attempts  Risk Reduction Factors:   NA  Continued Clinical Symptoms:  Severe Anxiety and/or Agitation Bipolar Disorder:   Depressive phase  Cognitive Features That Contribute To Risk:  Polarized thinking    Suicide Risk:  Minimal: No identifiable suicidal ideation.  Patients presenting with no risk factors but with morbid ruminations; may be classified as minimal risk based on the severity of the depressive symptoms  Principal Problem: Generalized anxiety disorder Discharge Diagnoses:  Patient Active Problem List   Diagnosis Date Noted  . Generalized anxiety disorder [F41.1] 06/28/2012    Priority: High  . Depression [F32.9]   . Mood disorder [F39] 12/05/2013  . Cognitive deficits [R41.89] 10/12/2012  . Bipolar I disorder, most recent episode (or current) depressed, unspecified [F31.30] 07/02/2012    Class: Chronic      Plan Of Care/Follow-up recommendations:  Activity:  as tolerated Diet:  regular  Is patient on multiple antipsychotic therapies at discharge:  No   Has Patient had three or more  failed trials of antipsychotic monotherapy by history:  No  Recommended Plan for Multiple Antipsychotic Therapies: NA    Jerusalem Brownstein C   PMHNP-BC 12/20/2014, 11:21 AM

## 2014-12-20 NOTE — Discharge Instructions (Signed)
For your ongoing mental health needs, you are advised to follow up with [facility].  If you do not currently have an appointment, new and returning patients are seen at their walk-in clinic.  Walk-in hours are Monday - Friday from 8:00 am - 3:00 pm.  Walk-in patients are seen on a first come, first served basis.  Try to arrive as early as possible for he best chance of being seen the same day: ° °     [facility] °     [address] °     , [place] [address] °     [phone] °
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
## 2014-12-20 NOTE — BH Assessment (Signed)
[place] Assessment Progress Note  Per [name], MD, this pt does not require psychiatric hospitalization at this time.  She is to be discharged from [facility] with outpatient referrals.  Discharge instructions include recommendation to follow up with [facility].  Pt's nurse has been notified.  Jalene Mullet, [place] Triage Specialist [phone]
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
## 2014-12-21 ENCOUNTER — Emergency Department (HOSPITAL_COMMUNITY)
Admission: EM | Admit: 2014-12-21 | Discharge: 2014-12-22 | Disposition: A | Discharge status: Other Acute Inpatient Hospital | Payer: Medicaid Other | Attending: Emergency Medicine | Admitting: Emergency Medicine

## 2014-12-21 ENCOUNTER — Encounter (HOSPITAL_COMMUNITY): Payer: Self-pay | Admitting: Emergency Medicine

## 2014-12-21 DIAGNOSIS — F332 Major depressive disorder, recurrent severe without psychotic features: Secondary | ICD-10-CM | POA: Diagnosis not present

## 2014-12-21 DIAGNOSIS — Z79899 Other long term (current) drug therapy: Secondary | ICD-10-CM | POA: Insufficient documentation

## 2014-12-21 DIAGNOSIS — F411 Generalized anxiety disorder: Secondary | ICD-10-CM | POA: Diagnosis present

## 2014-12-21 DIAGNOSIS — T43592A Poisoning by other antipsychotics and neuroleptics, intentional self-harm, initial encounter: Secondary | ICD-10-CM | POA: Diagnosis not present

## 2014-12-21 DIAGNOSIS — F419 Anxiety disorder, unspecified: Secondary | ICD-10-CM | POA: Insufficient documentation

## 2014-12-21 DIAGNOSIS — Y998 Other external cause status: Secondary | ICD-10-CM | POA: Diagnosis not present

## 2014-12-21 DIAGNOSIS — T383X2A Poisoning by insulin and oral hypoglycemic [antidiabetic] drugs, intentional self-harm, initial encounter: Secondary | ICD-10-CM | POA: Diagnosis not present

## 2014-12-21 DIAGNOSIS — Y9389 Activity, other specified: Secondary | ICD-10-CM | POA: Insufficient documentation

## 2014-12-21 DIAGNOSIS — Y9289 Other specified places as the place of occurrence of the external cause: Secondary | ICD-10-CM | POA: Insufficient documentation

## 2014-12-21 DIAGNOSIS — E119 Type 2 diabetes mellitus without complications: Secondary | ICD-10-CM | POA: Insufficient documentation

## 2014-12-21 DIAGNOSIS — R45851 Suicidal ideations: Secondary | ICD-10-CM | POA: Diagnosis not present

## 2014-12-21 DIAGNOSIS — Z880 Allergy status to penicillin: Secondary | ICD-10-CM | POA: Insufficient documentation

## 2014-12-21 DIAGNOSIS — Z720 Tobacco use: Secondary | ICD-10-CM | POA: Insufficient documentation

## 2014-12-21 DIAGNOSIS — T50902A Poisoning by unspecified drugs, medicaments and biological substances, intentional self-harm, initial encounter: Secondary | ICD-10-CM

## 2014-12-21 LAB — URINALYSIS, ROUTINE W REFLEX MICROSCOPIC
BILIRUBIN URINE: NEGATIVE
Glucose, UA: NEGATIVE mg/dL
Hgb urine dipstick: NEGATIVE
KETONES UR: NEGATIVE mg/dL
LEUKOCYTES UA: NEGATIVE
NITRITE: NEGATIVE
PH: 6 (ref 5.0–8.0)
Protein, ur: NEGATIVE mg/dL
SPECIFIC GRAVITY, URINE: 1.029 (ref 1.005–1.030)
UROBILINOGEN UA: 1 mg/dL (ref 0.0–1.0)

## 2014-12-21 LAB — COMPREHENSIVE METABOLIC PANEL
ALBUMIN: 4.3 g/dL (ref 3.5–5.0)
ALK PHOS: 62 U/L (ref 38–126)
ALT: 33 U/L (ref 14–54)
AST: 31 U/L (ref 15–41)
Anion gap: 7 (ref 5–15)
BILIRUBIN TOTAL: 0.6 mg/dL (ref 0.3–1.2)
BUN: 9 mg/dL (ref 6–20)
CALCIUM: 9 mg/dL (ref 8.9–10.3)
CO2: 27 mmol/L (ref 22–32)
CREATININE: 0.86 mg/dL (ref 0.44–1.00)
Chloride: 105 mmol/L (ref 101–111)
GFR calc Af Amer: >60 mL/min (ref >60)
GFR calc non Af Amer: >60 mL/min (ref >60)
GLUCOSE: 98 mg/dL (ref 65–99)
Potassium: 3.4 mmol/L — ABNORMAL LOW (ref 3.5–5.1)
SODIUM: 139 mmol/L (ref 135–145)
TOTAL PROTEIN: 7.5 g/dL (ref 6.5–8.1)

## 2014-12-21 LAB — CBC
HCT: 40.7 % (ref 36.0–46.0)
HEMOGLOBIN: 12.9 g/dL (ref 12.0–15.0)
MCH: 26.1 pg (ref 26.0–34.0)
MCHC: 31.7 g/dL (ref 30.0–36.0)
MCV: 82.4 fL (ref 78.0–100.0)
Platelets: 213 10*3/uL (ref 150–400)
RBC: 4.94 MIL/uL (ref 3.87–5.11)
RDW: 14.3 % (ref 11.5–15.5)
WBC: 9.3 10*3/uL (ref 4.0–10.5)

## 2014-12-21 LAB — I-STAT CG4 LACTIC ACID, ED: LACTIC ACID, VENOUS: 1.09 mmol/L (ref 0.5–2.0)

## 2014-12-21 LAB — RAPID URINE DRUG SCREEN, HOSP PERFORMED
Amphetamines: NOT DETECTED
Barbiturates: NOT DETECTED
Benzodiazepines: NOT DETECTED
Cocaine: NOT DETECTED
OPIATES: NOT DETECTED
TETRAHYDROCANNABINOL: NOT DETECTED

## 2014-12-21 LAB — CBG MONITORING, ED: Glucose-Capillary: 94 mg/dL (ref 65–99)

## 2014-12-21 LAB — ACETAMINOPHEN LEVEL

## 2014-12-21 LAB — SALICYLATE LEVEL

## 2014-12-21 LAB — ETHANOL: Alcohol, Ethyl (B): <5 mg/dL (ref <5)

## 2014-12-21 MED ORDER — ALUM & MAG HYDROXIDE-SIMETH 200-200-20 MG/5ML PO SUSP: 30.0000 mL | ORAL | Status: DC | PRN

## 2014-12-21 MED ORDER — IBUPROFEN 200 MG PO TABS: 600.0000 mg | ORAL_TABLET | Freq: Three times a day (TID) | ORAL | Status: DC | PRN

## 2014-12-21 MED ORDER — ACETAMINOPHEN 325 MG PO TABS: 650.0000 mg | ORAL_TABLET | ORAL | Status: DC | PRN

## 2014-12-21 MED ORDER — ONDANSETRON HCL 4 MG PO TABS: 4.0000 mg | ORAL_TABLET | Freq: Three times a day (TID) | ORAL | Status: DC | PRN

## 2014-12-21 MED ORDER — LORAZEPAM 1 MG PO TABS: 1.0000 mg | ORAL_TABLET | Freq: Three times a day (TID) | ORAL | Status: DC | PRN
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
## 2014-12-21 NOTE — BH Assessment (Signed)
[place] Assessment Progress Note   Called and scheduled pt's tele assessment with this clinician.  Also, called EDP and gathered clinical information on the pt.  Shaune Pascal, MS, [facility] Therapeutic Triage Specialist [facility]

## 2014-12-21 NOTE — ED Notes (Signed)
Pt cleared through poison control at this time.

## 2014-12-21 NOTE — ED Notes (Signed)
TTS speaking with pt via webcam

## 2014-12-21 NOTE — ED Notes (Signed)
Bed: RESB Expected date:  Expected time:  Means of arrival:  Comments: ingestion
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
## 2014-12-21 NOTE — ED Notes (Signed)
Bed: WA29 Expected date:  Expected time:  Means of arrival:  Comments: Res B

## 2014-12-21 NOTE — ED Notes (Signed)
Staffing made aware of need for sitter SI
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
## 2014-12-22 ENCOUNTER — Inpatient Hospital Stay (HOSPITAL_COMMUNITY)
Admission: EM | Admit: 2014-12-22 | Discharge: 2014-12-25 | DRG: 885 | Disposition: A | Discharge status: Home / Self Care | Payer: Medicaid Other | Source: Intra-hospital | Attending: Psychiatry | Admitting: Psychiatry

## 2014-12-22 ENCOUNTER — Encounter (HOSPITAL_COMMUNITY): Payer: Self-pay | Admitting: *Deleted

## 2014-12-22 DIAGNOSIS — R45851 Suicidal ideations: Secondary | ICD-10-CM | POA: Diagnosis present

## 2014-12-22 DIAGNOSIS — Z833 Family history of diabetes mellitus: Secondary | ICD-10-CM | POA: Diagnosis not present

## 2014-12-22 DIAGNOSIS — T50901A Poisoning by unspecified drugs, medicaments and biological substances, accidental (unintentional), initial encounter: Secondary | ICD-10-CM | POA: Insufficient documentation

## 2014-12-22 DIAGNOSIS — F332 Major depressive disorder, recurrent severe without psychotic features: Principal | ICD-10-CM | POA: Diagnosis present

## 2014-12-22 DIAGNOSIS — G4710 Hypersomnia, unspecified: Secondary | ICD-10-CM | POA: Diagnosis present

## 2014-12-22 DIAGNOSIS — Z8249 Family history of ischemic heart disease and other diseases of the circulatory system: Secondary | ICD-10-CM | POA: Diagnosis not present

## 2014-12-22 DIAGNOSIS — F333 Major depressive disorder, recurrent, severe with psychotic symptoms: Secondary | ICD-10-CM | POA: Insufficient documentation

## 2014-12-22 DIAGNOSIS — F411 Generalized anxiety disorder: Secondary | ICD-10-CM | POA: Diagnosis present

## 2014-12-22 DIAGNOSIS — E119 Type 2 diabetes mellitus without complications: Secondary | ICD-10-CM | POA: Diagnosis present

## 2014-12-22 DIAGNOSIS — T50902A Poisoning by unspecified drugs, medicaments and biological substances, intentional self-harm, initial encounter: Secondary | ICD-10-CM | POA: Insufficient documentation

## 2014-12-22 DIAGNOSIS — F17210 Nicotine dependence, cigarettes, uncomplicated: Secondary | ICD-10-CM | POA: Diagnosis present

## 2014-12-22 LAB — CBG MONITORING, ED
GLUCOSE-CAPILLARY: 72 mg/dL (ref 65–99)
GLUCOSE-CAPILLARY: 128 mg/dL — AB (ref 65–99)
GLUCOSE-CAPILLARY: 92 mg/dL (ref 65–99)

## 2014-12-22 LAB — GLUCOSE, CAPILLARY: GLUCOSE-CAPILLARY: 128 mg/dL — AB (ref 65–99)

## 2014-12-22 MED ORDER — ACETAMINOPHEN 325 MG PO TABS: 650.0000 mg | ORAL_TABLET | Freq: Four times a day (QID) | ORAL | Status: DC | PRN

## 2014-12-22 MED ORDER — IBUPROFEN 600 MG PO TABS: 600.0000 mg | ORAL_TABLET | Freq: Three times a day (TID) | ORAL | Status: DC | PRN

## 2014-12-22 MED ORDER — ALUM & MAG HYDROXIDE-SIMETH 200-200-20 MG/5ML PO SUSP: 30.0000 mL | ORAL | Status: DC | PRN

## 2014-12-22 MED ORDER — MAGNESIUM HYDROXIDE 400 MG/5ML PO SUSP: 30.0000 mL | Freq: Every day | ORAL | Status: DC | PRN

## 2014-12-22 MED ORDER — DULOXETINE HCL 60 MG PO CPEP
60.0000 mg | ORAL_CAPSULE | Freq: Every day | ORAL | Status: DC
  Administered 2014-12-23: 60 mg via ORAL
  Filled 2014-12-22 (×4): qty 1

## 2014-12-22 MED ORDER — ONDANSETRON HCL 4 MG PO TABS: 4.0000 mg | ORAL_TABLET | Freq: Three times a day (TID) | ORAL | Status: DC | PRN

## 2014-12-22 MED ORDER — ACETAMINOPHEN 325 MG PO TABS: 650.0000 mg | ORAL_TABLET | ORAL | Status: DC | PRN

## 2014-12-22 MED ORDER — DULOXETINE HCL 60 MG PO CPEP
60.0000 mg | ORAL_CAPSULE | Freq: Every day | ORAL | Status: DC
Start: 2014-12-22 — End: 2014-12-22
  Administered 2014-12-22: 60 mg via ORAL
  Filled 2014-12-22: qty 1
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
## 2014-12-22 NOTE — Tx Team (Signed)
Initial Interdisciplinary Treatment Plan   PATIENT STRESSORS: Financial difficulties Marital or family conflict Traumatic event   PATIENT STRENGTHS: Active sense of humor Communication skills General fund of knowledge   PROBLEM LIST: Problem List/Patient Goals Date to be addressed Date deferred Reason deferred Estimated date of resolution  Suicidal Ideation 2' MDD 12/22/2014     DM 12/22/2014     " I need a shot"      " I didn't like what that girl said about me" 12/22/2014                                    DISCHARGE CRITERIA:  Ability to meet basic life and health needs Adequate post-discharge living arrangements Improved stabilization in mood, thinking, and/or behavior  PRELIMINARY DISCHARGE PLAN: Attend aftercare/continuing care group Attend PHP/IOP Attend 12-step recovery group  PATIENT/FAMIILY INVOLVEMENT: This treatment plan has been presented to and reviewed with the patient, [name], and/or family member,.  The patient and family have been given the opportunity to ask questions and make suggestions.  Lauralyn Primes 12/22/2014, 7:40 PM

## 2014-12-22 NOTE — Progress Notes (Signed)
Adult Psychoeducational Group Note  Date:  12/22/2014 Time:  8:53 PM  Group Topic/Focus:  Wrap-Up Group:   The focus of this group is to help patients review their daily goal of treatment and discuss progress on daily workbooks.  Participation Level:  Active  Participation Quality:  Appropriate  Affect:  Appropriate  Cognitive:  Appropriate  Insight: Appropriate  Engagement in Group:  Engaged  Modes of Intervention:  Discussion  Nash Shearer 12/22/2014, 8:53 PM
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
## 2014-12-22 NOTE — BHH Counselor (Signed)
Patient has been accepted to [facility] 400-1 per Letitia Libra, RN, [facility] . Patient reviewed and signed voluntary consent to treat paperwork and ROI. Paperwork was faxed to [facility]. Patients nurse notified.   Rosalin Hawking, LCSW Therapeutic Triage Specialist [place] 12/22/2014 4:39 PM

## 2014-12-23 ENCOUNTER — Encounter (HOSPITAL_COMMUNITY): Payer: Self-pay | Admitting: Psychiatry

## 2014-12-23 DIAGNOSIS — F333 Major depressive disorder, recurrent, severe with psychotic symptoms: Secondary | ICD-10-CM

## 2014-12-23 DIAGNOSIS — R45851 Suicidal ideations: Secondary | ICD-10-CM

## 2014-12-23 LAB — GLUCOSE, CAPILLARY: Glucose-Capillary: 109 mg/dL — ABNORMAL HIGH (ref 65–99)

## 2014-12-23 MED ORDER — PALIPERIDONE ER 6 MG PO TB24
6.0000 mg | ORAL_TABLET | Freq: Every day | ORAL | Status: DC
  Administered 2014-12-23 – 2014-12-24 (×2): 6 mg via ORAL
  Filled 2014-12-23 (×5): qty 1

## 2014-12-23 MED ORDER — DULOXETINE HCL 60 MG PO CPEP
90.0000 mg | ORAL_CAPSULE | Freq: Every day | ORAL | Status: DC
  Administered 2014-12-24 – 2014-12-25 (×2): 90 mg via ORAL
  Filled 2014-12-23 (×6): qty 1

## 2014-12-23 NOTE — Progress Notes (Signed)
D.  Pt pleasant on approach, denies complaints at this time.  Denies SI/HI/hallucinations at present.  Positive for evening wrap up group, interacting appropriately with peers on the unit.  A.  Support and encouragement offered, medications given as ordered  R.  Pt remains safe on unit, will continue to monitor.
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
## 2014-12-23 NOTE — Progress Notes (Signed)
Psychoeducational Group Note  Psychoeducational Group Note  Date: 12/23/2014 Time:  0930 Group Topic/Focus:  Gratefulness:  The focus of this group is to help patients identify what two things they are most grateful for in their lives. What helps ground them and to center them on their work to their recovery.  Participation Level:  Active  Participation Quality:  Appropriate  Affect:  Appropriate  Cognitive:  Oriented  Insight:  Improving  Engagement in Group:  Engaged  Additional Comments:    Paulino Rily

## 2014-12-23 NOTE — BHH Group Notes (Signed)
[facility] Group Notes:  (Clinical Social Work)  12/23/2014  1:15-2:15PM  Summary of Progress/Problems:   The main focus of today's process group was to   1)  discuss the importance of adding supports  2)  define health supports versus unhealthy supports  3)  identify the patient's current unhealthy supports and plan how to handle them  4)  Identify the patient's current healthy supports and plan what to add.  An emphasis was placed on using counselor, doctor, therapy groups, 12-step groups, and problem-specific support groups to expand supports.    The patient expressed full comprehension of the concepts presented, and agreed that there is a need to add more supports.  The patient stated her peers at the hospital and staff are good supports. She broke into inappropriate spontaneous laughter several times during group, but group members were able to ignore.  Type of Therapy:  Process Group with Motivational Interviewing  Participation Level:  Minimal  Participation Quality:  just sitting there  Affect:  Flat and Not Congruent  Cognitive:  Disorganized  Insight:  Limited  Engagement in Therapy:  Limited  Modes of Intervention:   Education, Support and Processing, Activity  Selmer Dominion, LCSW 12/23/2014

## 2014-12-23 NOTE — Plan of Care (Signed)
Problem: Spiritual Needs Goal: Ability to function at adequate level Outcome: Progressing [name] stated " it can  Really be  loud around here sometimes...". This nurse discussed with pt how high levels of noise can add to a person's stress and anxiety.

## 2014-12-23 NOTE — Progress Notes (Signed)
Psychoeducational Group Note  Date:  12/23/2014 Time:  1015  Group Topic/Focus:  Making Healthy Choices:   The focus of this group is to help patients identify negative/unhealthy choices they were using prior to admission and identify positive/healthier coping strategies to replace them upon discharge.  Participation Level:  Active  Participation Quality:  Appropriate  Affect:  Appropriate  Cognitive:  Appropriate  Insight:  Improving  Engagement in Group:  Engaged  Additional Comments:    Paulino Rily 12/23/2014
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
## 2014-12-23 NOTE — BHH Suicide Risk Assessment (Signed)
[facility] Admission Suicide Risk Assessment   Nursing information obtained from:  Patient Demographic factors:  Low socioeconomic status, Unemployed Current Mental Status:  Self-harm thoughts Loss Factors:  Decrease in vocational status, Loss of significant relationship Historical Factors:  Family history of suicide, Impulsivity Risk Reduction Factors:  Sense of responsibility to family, Positive social support Total Time spent with patient: 45 minutes Principal Problem: Recurrent major depression-severe Diagnosis:   Patient Active Problem List   Diagnosis Date Noted  . Recurrent major depression-severe [F33.2] 12/22/2014  . Drug overdose [T50.901A]   . Cognitive deficits [R41.89] 10/12/2012  . Generalized anxiety disorder [F41.1] 06/28/2012     Continued Clinical Symptoms:  Alcohol Use Disorder Identification Test Final Score (AUDIT): 0 The "Alcohol Use Disorders Identification Test", Guidelines for Use in Primary Care, Second Edition.  World Pharmacologist [facility]). Score between 0-7:  no or low risk or alcohol related problems. Score between 8-15:  moderate risk of alcohol related problems. Score between 16-19:  high risk of alcohol related problems. Score 20 or above:  warrants further diagnostic evaluation for alcohol dependence and treatment.   CLINICAL FACTORS:   Depression:   Severe   Psychiatric Specialty Exam: Physical Exam  ROS  Blood pressure 124/91, pulse 90, temperature 98 F (36.7 C), temperature source Oral, resp. rate 18, height 5' 6.5" (1.689 m), weight 120.203 kg (265 lb), last menstrual period 12/08/2014, SpO2 98 %, not currently breastfeeding.Body mass index is 42.14 kg/(m^2).    COGNITIVE FEATURES THAT CONTRIBUTE TO RISK:  Closed-mindedness, Polarized thinking and Thought constriction (tunnel vision)    SUICIDE RISK:   Moderate:  Frequent suicidal ideation with limited intensity, and duration, some specificity in terms of plans, no associated intent,  good self-control, limited dysphoria/symptomatology, some risk factors present, and identifiable protective factors, including available and accessible social support.  PLAN OF CARE:  Supportive approach/coping skills                               Get collateral information                                 Will increase the Cymbalta to 90 mg daily                                Will D/C the Latuda and resume the Invega as per her request                                Stress management Medical Decision Making:  Review of Psycho-Social Stressors (1) and Review or order clinical lab tests (1)  I certify that inpatient services furnished can reasonably be expected to improve the patient's condition.   Disautel A 12/23/2014, 7:50 PM

## 2014-12-23 NOTE — Progress Notes (Addendum)
D Sweetie is seen OOB UAL  On the 4oo hall today tolerated well. She attends her groups. She is engaged in the group therapy discussions  and says she feels better today. A She completed her daily assessment and on it she wrote she denied SI and she rated her depression '" 6/ 51/2 /  0,"  Respectively and she signed a 72 hr request for DC at 1145, MD notified..   A Her affect remains incongruent...she is seen sitting in her morning group...grinning from ear to ear. She says she told her doctor she wants " my shot back". She demonstrates cognitive delay as evidenced by the slowness that she process, the slowness of her speech.    R Safety is in place and poc cont.

## 2014-12-24 DIAGNOSIS — F332 Major depressive disorder, recurrent severe without psychotic features: Principal | ICD-10-CM

## 2014-12-24 LAB — GLUCOSE, CAPILLARY
GLUCOSE-CAPILLARY: 151 mg/dL — AB (ref 65–99)
Glucose-Capillary: 153 mg/dL — ABNORMAL HIGH (ref 65–99)

## 2014-12-24 LAB — I-STAT CG4 LACTIC ACID, ED: Lactic Acid, Venous: 0.56 mmol/L (ref 0.5–2.0)

## 2014-12-24 MED ORDER — PALIPERIDONE PALMITATE 117 MG/0.75ML IM SUSP: 117.0000 mg | INTRAMUSCULAR | Status: DC

## 2014-12-24 MED ORDER — PALIPERIDONE PALMITATE 234 MG/1.5ML IM SUSP: 234.0000 mg | Freq: Once | INTRAMUSCULAR | Status: DC

## 2014-12-24 MED ORDER — PALIPERIDONE PALMITATE 234 MG/1.5ML IM SUSP
234.0000 mg | Freq: Once | INTRAMUSCULAR | Status: AC
  Administered 2014-12-25: 234 mg via INTRAMUSCULAR

## 2014-12-24 MED ORDER — PALIPERIDONE PALMITATE 156 MG/ML IM SUSP: 156.0000 mg | Freq: Once | INTRAMUSCULAR | Status: DC

## 2014-12-24 MED ORDER — INSULIN ASPART 100 UNIT/ML ~~LOC~~ SOLN
0.0000 [IU] | Freq: Three times a day (TID) | SUBCUTANEOUS | Status: DC
  Administered 2014-12-24: 3 [IU] via SUBCUTANEOUS

## 2014-12-24 MED ORDER — METFORMIN HCL 500 MG PO TABS
500.0000 mg | ORAL_TABLET | Freq: Two times a day (BID) | ORAL | Status: DC
  Administered 2014-12-24 – 2014-12-25 (×2): 500 mg via ORAL
  Filled 2014-12-24 (×6): qty 1

## 2014-12-24 MED ORDER — INSULIN ASPART 100 UNIT/ML ~~LOC~~ SOLN
4.0000 [IU] | Freq: Three times a day (TID) | SUBCUTANEOUS | Status: DC
  Administered 2014-12-24 – 2014-12-25 (×3): 4 [IU] via SUBCUTANEOUS

## 2014-12-24 NOTE — Progress Notes (Signed)
D:Pt rates depression as a 1 and anxiety as a 3 on 0-10 scale with 10 being the most. Pt reports that she takes metformin twice a day at home and was diagnosed with diabetes three months ago.  A:Reported to NP for orders. Offered support and 15 minute checks. R:Pt denies si and hi. Safety maintained on the unit.

## 2014-12-24 NOTE — [facility] Group Notes (Signed)
[place] LCSW Group Therapy 12/24/2014  1:15 pm  Type of Therapy: Group Therapy Participation Level: Active  Participation Quality: Attentive, Sharing and Supportive  Affect: Appropriate  Cognitive: Alert and Oriented  Insight: Developing/Improving and Engaged  Engagement in Therapy: Developing/Improving and Engaged  Modes of Intervention: Clarification, Confrontation, Discussion, Education, Exploration,  Limit-setting, Orientation, Problem-solving, Rapport Building, Art therapist, Socialization and Support  Summary of Progress/Problems: Pt identified obstacles faced currently and processed barriers involved in overcoming these obstacles. Pt identified steps necessary for overcoming these obstacles and explored motivation (internal and external) for facing these difficulties head on. Pt further identified one area of concern in their lives and chose a goal to focus on for today. Patient identified medication noncompliance as an obstacle. CSW and other group members provided emotional support and suggestions on ways that patient can remember to be compliant with her medications.   Tilden Fossa, MSW, [place] Worker [facility] [phone]

## 2014-12-24 NOTE — Plan of Care (Signed)
Problem: Alteration in mood Goal: LTG-Patient reports reduction in suicidal thoughts (Patient reports reduction in suicidal thoughts and is able to verbalize a safety plan for whenever patient is feeling suicidal)  Outcome: Progressing Pt denies si thoughts.

## 2014-12-24 NOTE — [facility] Suicide Risk Assessment (Signed)
[place] INPATIENT:  Family/Significant Other Suicide Prevention Education  Suicide Prevention Education:  Education Completed; Mother Kimberlea Schlag [phone],  (name of family member/significant other) has been identified by the patient as the family member/significant other with whom the patient will be residing, and identified as the person(s) who will aid the patient in the event of a mental health crisis (suicidal ideations/suicide attempt).  With written consent from the patient, the family member/significant other has been provided the following suicide prevention education, prior to the and/or following the discharge of the patient.  The suicide prevention education provided includes the following:  Suicide risk factors  Suicide prevention and interventions  National Suicide Hotline telephone number  [facility] assessment telephone number  [facility] Emergency Assistance [place] and/or Residential Mobile Crisis Unit telephone number  Request made of family/significant other to:  Remove weapons (e.g., guns, rifles, knives), all items previously/currently identified as safety concern.    Remove drugs/medications (over-the-counter, prescriptions, illicit drugs), all items previously/currently identified as a safety concern.  The family member/significant other verbalizes understanding of the suicide prevention education information provided.  The family member/significant other agrees to remove the items of safety concern listed above.  Braelen Sproule, Casimiro Needle 12/24/2014, 11:47 AM
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
## 2014-12-24 NOTE — Progress Notes (Signed)
Recreation Therapy Notes   Date: 08.29.2016 Time: 9:30am Location: [address] Group Room   Group Topic: Stress Management  Goal Area(s) Addresses:  Patient will actively participate in stress management techniques presented during session.   Behavioral Response: Did not attend.   Laureen Ochs Gryphon Vand[place]en, LRT/CTRS       Latrelle Bazar L 12/24/2014 1:48 PM

## 2014-12-24 NOTE — [facility] Group Notes (Signed)
   [facility] LCSW Aftercare Discharge Planning Group Note  12/24/2014  8:45 AM   Participation Quality: Alert, Appropriate and Oriented  Mood/Affect: Appropriate  Depression Rating: 3  Anxiety Rating: 3  Thoughts of Suicide: Pt denies SI/HI  Will you contract for safety? Yes  Current AVH: Pt denies  Plan for Discharge/Comments: Pt attended discharge planning group and actively participated in group. CSW provided pt with today's workbook. Patient plans to return home to follow up with outpatient services.  Transportation Means: CSW continuing to assess  Supports: No supports mentioned at this time  Tilden Fossa, MSW, [place] Social Worker [employer] [phone]

## 2014-12-25 DIAGNOSIS — F333 Major depressive disorder, recurrent, severe with psychotic symptoms: Secondary | ICD-10-CM | POA: Insufficient documentation

## 2014-12-25 LAB — GLUCOSE, CAPILLARY
GLUCOSE-CAPILLARY: 93 mg/dL (ref 65–99)
Glucose-Capillary: 110 mg/dL — ABNORMAL HIGH (ref 65–99)

## 2014-12-25 MED ORDER — METFORMIN HCL 500 MG PO TABS: 500.0000 mg | ORAL_TABLET | Freq: Two times a day (BID) | ORAL | Status: DC

## 2014-12-25 MED ORDER — PALIPERIDONE ER 6 MG PO TB24: 6.0000 mg | ORAL_TABLET | Freq: Every day | ORAL | Status: DC

## 2014-12-25 MED ORDER — PALIPERIDONE PALMITATE 156 MG/ML IM SUSP: 156.0000 mg | Freq: Once | INTRAMUSCULAR | Status: DC

## 2014-12-25 MED ORDER — DULOXETINE HCL 30 MG PO CPEP: 90.0000 mg | ORAL_CAPSULE | Freq: Every day | ORAL | Status: DC

## 2014-12-25 MED ORDER — PALIPERIDONE PALMITATE 117 MG/0.75ML IM SUSP: 117.0000 mg | INTRAMUSCULAR | Status: DC

## 2014-12-25 NOTE — Progress Notes (Signed)
D. Pt had been up and visible in milieu this evening, did attend and participate in evening activity. Pt reports that she is feeling better, spoke about how she slept better last night and was informed that she is to receive an invega injection tomorrow. Pt received medication tonight without incident and did not verbalize any complaints. A. Support and encouragement provided. R. Safety maintained, will continue to monitor.

## 2014-12-25 NOTE — Progress Notes (Signed)
Patient ID: [name], female   DOB: [birth year], 24 y.o.   MRN: [id number]  Pt currently presents with a flat affect and cooperativew behavior. Pt seen smiling and interacting with other pts in the milieu. Pt to be discharged home today. Pt reports that she slept well and has a good appetite.  Pt provided with medications per providers orders. Pt's labs and vitals were monitored throughout the day. Pt supported emotionally and encouraged to express concerns and questions. Pt educated on medications. Pt given Kirt Boys injection today prior to dicharge, pt tolerated it well.  Pt's safety ensured with 15 minute and environmental checks. Pt currently denies SI/HI and A/V hallucinations. Pt verbally agrees to seek staff if SI/HI or A/VH occurs and to consult with staff before acting on these thoughts. Will continue POC.
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
## 2014-12-25 NOTE — Progress Notes (Addendum)
  [facility] Adult Case Management Discharge Plan :  Will you be returning to the same living situation after discharge:  Yes,  patient plans to return home with her mother At discharge, do you have transportation home?: Yes,  patient reports access to transportation Do you have the ability to pay for your medications: Yes,  patient will be provided with prescriptions at discharge  Release of information consent forms completed and in the chart;  Patient's signature needed at discharge.  Patient to Follow up at: Follow-up Information    Follow up with [facility].   Specialty:  Behavioral Health   Why:  Walk-in clinic Monday-Friday between 8 am to 3pm for assessment for therapy and medication management services.   Contact information:   [place] [address] [phone]       Patient denies SI/HI: Yes,  denies    Safety Planning and Suicide Prevention discussed: Yes,  with patient and mother  Have you used any form of tobacco in the last 30 days? (Cigarettes, Smokeless Tobacco, Cigars, and/or Pipes): No  Has patient been referred to the Quitline?: Yes faxed on 12/25/14  Zakhari Fogel L 12/25/2014, 10:10 AM

## 2014-12-25 NOTE — Progress Notes (Signed)
Patient ID: [name], female   DOB: [birth year], 24 y.o.   MRN: [id number]   Pt discharged home with the [employer] company and a taxi voucher from [facility]. Pt was stable and appreciative at the time of discharge. All papers and prescriptions were given and valuables returned. Verbal understanding expressed. Denies SI/HI and A/VH. Pt given opportunity to express concerns and ask questions.

## 2015-01-03 ENCOUNTER — Encounter (HOSPITAL_COMMUNITY): Payer: Self-pay

## 2015-01-03 ENCOUNTER — Emergency Department (HOSPITAL_COMMUNITY)
Admission: EM | Admit: 2015-01-03 | Discharge: 2015-01-03 | Discharge status: Against Medical Advice | Payer: Medicaid Other | Attending: Emergency Medicine | Admitting: Emergency Medicine

## 2015-01-03 DIAGNOSIS — K088 Other specified disorders of teeth and supporting structures: Secondary | ICD-10-CM | POA: Diagnosis not present

## 2015-01-03 DIAGNOSIS — Z880 Allergy status to penicillin: Secondary | ICD-10-CM | POA: Insufficient documentation

## 2015-01-03 DIAGNOSIS — F319 Bipolar disorder, unspecified: Secondary | ICD-10-CM | POA: Insufficient documentation

## 2015-01-03 DIAGNOSIS — E119 Type 2 diabetes mellitus without complications: Secondary | ICD-10-CM | POA: Diagnosis not present

## 2015-01-03 DIAGNOSIS — Z720 Tobacco use: Secondary | ICD-10-CM | POA: Diagnosis not present

## 2015-01-03 DIAGNOSIS — Z79899 Other long term (current) drug therapy: Secondary | ICD-10-CM | POA: Insufficient documentation

## 2015-01-03 DIAGNOSIS — F99 Mental disorder, not otherwise specified: Secondary | ICD-10-CM | POA: Diagnosis not present

## 2015-01-03 DIAGNOSIS — F419 Anxiety disorder, unspecified: Secondary | ICD-10-CM | POA: Insufficient documentation

## 2015-01-03 DIAGNOSIS — J0110 Acute frontal sinusitis, unspecified: Secondary | ICD-10-CM | POA: Diagnosis not present

## 2015-01-03 DIAGNOSIS — K0889 Other specified disorders of teeth and supporting structures: Secondary | ICD-10-CM

## 2015-01-03 DIAGNOSIS — R0981 Nasal congestion: Secondary | ICD-10-CM | POA: Diagnosis present

## 2015-01-03 NOTE — ED Notes (Signed)
Upon entering the pts room, it was empty and clean. Pt left.

## 2015-01-03 NOTE — ED Provider Notes (Signed)
CSN: [id number]     Arrival date & time 01/03/15  1146 History  This chart was scribed for non-physician practitioner, Lenn Sink, PA-C working with Milton Ferguson, MD by Judithann Sauger, ED Scribe. The patient was seen in room TR06C/TR06C and the patient's care was started at 2:30 PM      No chief complaint on file.  The history is provided by the patient. No language interpreter was used.   HPI Comments: [name] is a 24 y.o. female who presents to the Emergency Department complaining of non-painful nasal congestion and pressure onset 2 weeks ago. She reports associated gum swelling and pain onset 5 days ago. She denies any nasal discharge, HA, chills, fever, or N/V/D. She also reports trouble eating and adds that she brushes her teeth every other day. She states that she saw her PCP about 2 months ago when she found out that she has DM without complications. She is unsure if she has a hx of allergies. She denies taking any medication for the symptoms.   PCP: Dr. Vista Lawman    Past Medical History  Diagnosis Date  . Anxiety   . Mental disorder   . Depression   . Cognitive deficits   . Bipolar 1 disorder   . Diabetes mellitus without complication    Past Surgical History  Procedure Laterality Date  . Cesarean section    . Tonsillectomy N/A 06/03/2012    Procedure: TONSILLECTOMY;  Surgeon: [name], MD;  Location: [facility];  Service: ENT;  Laterality: N/A;  . Mass excision N/A 06/03/2012    Procedure: EXCISION MASS;  Surgeon: [name], MD;  Location: [place];  Service: ENT;  Laterality: N/A;  Excision uvula mass  . Cesarean section N/A 04/25/2013    Procedure: REPEAT CESAREAN SECTION;  Surgeon: [name], MD;  Location: [facility];  Service: Obstetrics;  Laterality: N/A;  . Tonsillectomy     Family History  Problem Relation Age of Onset  . Hypertension Mother   . Diabetes Father    Social History  Substance Use Topics  .  Smoking status: Current Some Day Smoker -- 7 years    Types: Cigarettes  . Smokeless tobacco: Never Used  . Alcohol Use: No   OB History    Gravida Para Term Preterm AB TAB SAB Ectopic Multiple Living   3 3 3  0 0 0 0 0 0 3     Review of Systems  All other systems reviewed and are negative.     Allergies  Penicillins  Home Medications   Prior to Admission medications   Medication Sig Start Date End Date Taking? Authorizing Provider  DULoxetine (CYMBALTA) 30 MG capsule Take 3 capsules (90 mg total) by mouth daily. For depression 12/25/14   Encarnacion Slates, NP  metFORMIN (GLUCOPHAGE) 500 MG tablet Take 1 tablet (500 mg total) by mouth 2 (two) times daily. For diabetes management 12/25/14   Encarnacion Slates, NP  paliperidone (INVEGA SUSTENNA) 156 MG/ML SUSP injection Inject 1 mL (156 mg total) into the muscle once. (Due 01-01-15): For mood control 01/01/15   Encarnacion Slates, NP  paliperidone (INVEGA) 6 MG 24 hr tablet Take 1 tablet (6 mg total) by mouth at bedtime. For mood control 12/25/14   Encarnacion Slates, NP  Paliperidone Palmitate (INVEGA SUSTENNA) 117 MG/0.75ML SUSP Inject 117 mg into the muscle every 28 (twenty-eight) days. (Due on 01-28-15): For mood control 01/29/15   Encarnacion Slates, NP  BP 130/78 mmHg  Pulse 100  Temp(Src) 98.4 F (36.9 C)  Resp 18  Ht 5\' 7"  (1.702 m)  Wt 265 lb (120.203 kg)  BMI 41.50 kg/m2  SpO2 99%  LMP 12/30/2014 (Approximate) Physical Exam  Constitutional: She is oriented to person, place, and time. She appears well-developed and well-nourished.  HENT:  Head: Normocephalic and atraumatic.  Right Ear: Tympanic membrane normal.  Left Ear: Tympanic membrane normal.  Nose: Nose normal. Right sinus exhibits no maxillary sinus tenderness. Left sinus exhibits no maxillary sinus tenderness.  Mouth/Throat: No dental abscesses. No oropharyngeal exudate.  Gum lines soft, non-tender Floor of mouth normal   Neck: Normal range of motion and full passive range of motion  without pain. Neck supple. No rigidity.  Cardiovascular: Normal rate.   Pulmonary/Chest: Effort normal.  Abdominal: She exhibits no distension.  Neurological: She is alert and oriented to person, place, and time.  Skin: Skin is warm and dry.  Psychiatric: She has a normal mood and affect.  Nursing note and vitals reviewed.   ED Course  Procedures (including critical care time) DIAGNOSTIC STUDIES: Oxygen Saturation is 97% on RA, adequate by my interpretation.    COORDINATION OF CARE: 2:43 PM- Pt advised of plan for treatment and pt agrees.    Labs Review Labs Reviewed - No data to display  Imaging Review No results found. I have personally reviewed and evaluated these images and lab results as part of my medical decision-making.   EKG Interpretation None      MDM   Final diagnoses:  Pain, dental  Acute frontal sinusitis, recurrence not specified   Labs:  Imaging:  Consults:  Therapeutics:  Discharge Meds:   Assessment/Plan: Patient left before she could receive prescription medications, plan to treat sinusitis with Flonase, Claritin as likely related to allergies and not a acute bacterial infection. Patient eloped from the room before receiving discharge instructions or medications. I do not suspect this is bacterial sinusitis, comp indicated dental pain as there are no specific findings on her exam. Pt asked to follow-up with PCP.         Okey Regal, PA-C 01/03/15 [place], MD 01/04/15 [phone]

## 2015-01-03 NOTE — ED Notes (Signed)
Pt presents with 2 week h/o clear nasal drainage and 4-5 day h/o gum swelling and pain.  Pt also reports 5 day h/o pain to outer L breast.

## 2015-01-07 ENCOUNTER — Emergency Department (HOSPITAL_COMMUNITY)
Admission: EM | Admit: 2015-01-07 | Discharge: 2015-01-07 | Discharge status: Against Medical Advice | Payer: Medicaid Other | Attending: Emergency Medicine | Admitting: Emergency Medicine

## 2015-01-07 DIAGNOSIS — Z720 Tobacco use: Secondary | ICD-10-CM | POA: Diagnosis not present

## 2015-01-07 DIAGNOSIS — E119 Type 2 diabetes mellitus without complications: Secondary | ICD-10-CM | POA: Insufficient documentation

## 2015-01-07 DIAGNOSIS — K088 Other specified disorders of teeth and supporting structures: Secondary | ICD-10-CM | POA: Insufficient documentation

## 2015-01-07 NOTE — ED Notes (Signed)
No answer when pt named called to triage; pt was seen walking out ER doors

## 2015-01-07 NOTE — ED Notes (Signed)
No answer when pt named called, unable to locate pt

## 2015-01-07 NOTE — ED Notes (Signed)
Per EMS- Patient c/o right upper and lower right dental pain and gum swelling.. Patient was seen at [facility] 4 days ago and patient stated that [facility] did not see any gum swelling, but knows she has gum swelling. Patient has not followed up with a dentist nor has she taken anything for pain.

## 2015-01-07 NOTE — ED Notes (Signed)
No answer when pt name called; unable to locate pt, pt eloped after arriving via EMS prior to triage

## 2015-01-14 ENCOUNTER — Encounter (HOSPITAL_COMMUNITY): Payer: Self-pay

## 2015-01-14 ENCOUNTER — Emergency Department (HOSPITAL_COMMUNITY)
Admission: EM | Admit: 2015-01-14 | Discharge: 2015-01-15 | Disposition: A | Discharge status: Other Acute Inpatient Hospital | Payer: Medicaid Other | Attending: Emergency Medicine | Admitting: Emergency Medicine

## 2015-01-14 DIAGNOSIS — D849 Immunodeficiency, unspecified: Secondary | ICD-10-CM | POA: Diagnosis not present

## 2015-01-14 DIAGNOSIS — R45851 Suicidal ideations: Secondary | ICD-10-CM

## 2015-01-14 DIAGNOSIS — Z880 Allergy status to penicillin: Secondary | ICD-10-CM | POA: Insufficient documentation

## 2015-01-14 DIAGNOSIS — F333 Major depressive disorder, recurrent, severe with psychotic symptoms: Secondary | ICD-10-CM | POA: Diagnosis not present

## 2015-01-14 DIAGNOSIS — Z79899 Other long term (current) drug therapy: Secondary | ICD-10-CM | POA: Diagnosis not present

## 2015-01-14 DIAGNOSIS — E119 Type 2 diabetes mellitus without complications: Secondary | ICD-10-CM | POA: Diagnosis not present

## 2015-01-14 DIAGNOSIS — F319 Bipolar disorder, unspecified: Secondary | ICD-10-CM | POA: Insufficient documentation

## 2015-01-14 DIAGNOSIS — F411 Generalized anxiety disorder: Secondary | ICD-10-CM | POA: Diagnosis not present

## 2015-01-14 DIAGNOSIS — Z720 Tobacco use: Secondary | ICD-10-CM | POA: Diagnosis not present

## 2015-01-14 LAB — COMPREHENSIVE METABOLIC PANEL
ALBUMIN: 4.2 g/dL (ref 3.5–5.0)
ALT: 25 U/L (ref 14–54)
AST: 25 U/L (ref 15–41)
Alkaline Phosphatase: 55 U/L (ref 38–126)
Anion gap: 8 (ref 5–15)
BUN: 10 mg/dL (ref 6–20)
CHLORIDE: 108 mmol/L (ref 101–111)
CO2: 25 mmol/L (ref 22–32)
CREATININE: 0.67 mg/dL (ref 0.44–1.00)
Calcium: 9.2 mg/dL (ref 8.9–10.3)
GFR calc Af Amer: >60 mL/min (ref >60)
Glucose, Bld: 98 mg/dL (ref 65–99)
Potassium: 3.9 mmol/L (ref 3.5–5.1)
SODIUM: 141 mmol/L (ref 135–145)
Total Bilirubin: 0.5 mg/dL (ref 0.3–1.2)
Total Protein: 7.6 g/dL (ref 6.5–8.1)

## 2015-01-14 LAB — CBC
HCT: 40.6 % (ref 36.0–46.0)
HEMOGLOBIN: 12.9 g/dL (ref 12.0–15.0)
MCH: 26.1 pg (ref 26.0–34.0)
MCHC: 31.8 g/dL (ref 30.0–36.0)
MCV: 82.2 fL (ref 78.0–100.0)
PLATELETS: 224 10*3/uL (ref 150–400)
RBC: 4.94 MIL/uL (ref 3.87–5.11)
RDW: 14.6 % (ref 11.5–15.5)
WBC: 6.9 10*3/uL (ref 4.0–10.5)

## 2015-01-14 LAB — RAPID URINE DRUG SCREEN, HOSP PERFORMED
AMPHETAMINES: NOT DETECTED
BENZODIAZEPINES: NOT DETECTED
Barbiturates: NOT DETECTED
Cocaine: NOT DETECTED
OPIATES: NOT DETECTED
TETRAHYDROCANNABINOL: NOT DETECTED

## 2015-01-14 LAB — SALICYLATE LEVEL: Salicylate Lvl: <4 mg/dL (ref 2.8–30.0)

## 2015-01-14 LAB — ACETAMINOPHEN LEVEL: Acetaminophen (Tylenol), Serum: <10 ug/mL — ABNORMAL LOW (ref 10–30)

## 2015-01-14 LAB — ETHANOL

## 2015-01-14 MED ORDER — DULOXETINE HCL 30 MG PO CPEP
90.0000 mg | ORAL_CAPSULE | Freq: Every day | ORAL | Status: DC
  Administered 2015-01-14 – 2015-01-15 (×2): 90 mg via ORAL
  Filled 2015-01-14 (×2): qty 1

## 2015-01-14 MED ORDER — PALIPERIDONE PALMITATE 156 MG/ML IM SUSP
156.0000 mg | Freq: Once | INTRAMUSCULAR | Status: DC
Start: 2015-01-14 — End: 2015-01-14

## 2015-01-14 MED ORDER — METFORMIN HCL 500 MG PO TABS
500.0000 mg | ORAL_TABLET | Freq: Two times a day (BID) | ORAL | Status: DC
  Administered 2015-01-14 – 2015-01-15 (×2): 500 mg via ORAL
  Filled 2015-01-14 (×4): qty 1

## 2015-01-14 MED ORDER — PALIPERIDONE PALMITATE 117 MG/0.75ML IM SUSP: 117.0000 mg | INTRAMUSCULAR | Status: DC

## 2015-01-14 MED ORDER — PALIPERIDONE ER 6 MG PO TB24
6.0000 mg | ORAL_TABLET | Freq: Every day | ORAL | Status: DC
  Administered 2015-01-14: 6 mg via ORAL
  Filled 2015-01-14 (×2): qty 1

## 2015-01-14 NOTE — ED Provider Notes (Signed)
CSN: [id number]     Arrival date & time 01/14/15  1146 History   First MD Initiated Contact with Patient 01/14/15 1244     Chief Complaint  Patient presents with  . Suicidal     (Consider location/radiation/quality/duration/timing/severity/associated sxs/prior Treatment) HPI Patient feeling suicidal for the past several days. She is suicidal over breakup with boyfriend. Her plan is to overdose with pills. She does have history of overdose in the past. She has not attempted suicide. No other associated symptoms. No treatment prior to coming here. Past Medical History  Diagnosis Date  . Anxiety   . Mental disorder   . Depression   . Cognitive deficits   . Bipolar 1 disorder   . Diabetes mellitus without complication    suicide attempt Past Surgical History  Procedure Laterality Date  . Cesarean section    . Tonsillectomy N/A 06/03/2012    Procedure: TONSILLECTOMY;  Surgeon: [name], MD;  Location: [place];  Service: ENT;  Laterality: N/A;  . Mass excision N/A 06/03/2012    Procedure: EXCISION MASS;  Surgeon: [name], MD;  Location: [place];  Service: ENT;  Laterality: N/A;  Excision uvula mass  . Cesarean section N/A 04/25/2013    Procedure: REPEAT CESAREAN SECTION;  Surgeon: [name], MD;  Location: [facility];  Service: Obstetrics;  Laterality: N/A;  . Tonsillectomy     Family History  Problem Relation Age of Onset  . Hypertension Mother   . Diabetes Father    Social History  Substance Use Topics  . Smoking status: Current Some Day Smoker -- 7 years    Types: Cigarettes  . Smokeless tobacco: Never Used  . Alcohol Use: No   OB History    Gravida Para Term Preterm AB TAB SAB Ectopic Multiple Living   3 3 3  0 0 0 0 0 0 3     Review of Systems  Constitutional: Negative.   HENT: Negative.   Respiratory: Negative.   Cardiovascular: Negative.   Gastrointestinal: Negative.   Musculoskeletal: Negative.   Skin: Negative.    Allergic/Immunologic: Positive for immunocompromised state.       Diabetic  Neurological: Negative.   Psychiatric/Behavioral: Positive for suicidal ideas.  All other systems reviewed and are negative.     Allergies  Penicillins  Home Medications   Prior to Admission medications   Medication Sig Start Date End Date Taking? Authorizing Provider  DULoxetine (CYMBALTA) 30 MG capsule Take 3 capsules (90 mg total) by mouth daily. For depression 12/25/14  Yes Encarnacion Slates, NP  metFORMIN (GLUCOPHAGE) 500 MG tablet Take 1 tablet (500 mg total) by mouth 2 (two) times daily. For diabetes management 12/25/14  Yes Encarnacion Slates, NP  paliperidone (INVEGA SUSTENNA) 156 MG/ML SUSP injection Inject 1 mL (156 mg total) into the muscle once. (Due 01-01-15): For mood control 01/01/15  Yes Encarnacion Slates, NP  paliperidone (INVEGA) 6 MG 24 hr tablet Take 1 tablet (6 mg total) by mouth at bedtime. For mood control 12/25/14  Yes Encarnacion Slates, NP  Paliperidone Palmitate (INVEGA SUSTENNA) 117 MG/0.75ML SUSP Inject 117 mg into the muscle every 28 (twenty-eight) days. (Due on 01-28-15): For mood control 01/29/15   Encarnacion Slates, NP   BP 119/55 mmHg  Pulse 108  Temp(Src) 98 F (36.7 C) (Oral)  Resp 20  SpO2 97%  LMP 12/30/2014 (Approximate) Physical Exam  Constitutional: She is oriented to person, place, and time. She appears well-developed and well-nourished.  HENT:  Head: Normocephalic and atraumatic.  Eyes: Conjunctivae are normal. Pupils are equal, round, and reactive to light.  Neck: Neck supple. No tracheal deviation present. No thyromegaly present.  Cardiovascular: Normal rate and regular rhythm.   No murmur heard. Pulmonary/Chest: Effort normal and breath sounds normal.  Abdominal: Soft. Bowel sounds are normal. She exhibits no distension. There is no tenderness.  Musculoskeletal: Normal range of motion. She exhibits no edema or tenderness.  Neurological: She is alert and oriented to person, place,  and time. No cranial nerve deficit. Coordination normal.  Gait normal  Skin: Skin is warm and dry. No rash noted.  Psychiatric:  Depressed affect  Nursing note and vitals reviewed.   ED Course  Procedures (including critical care time) Labs Review Labs Reviewed  COMPREHENSIVE METABOLIC PANEL  CBC  ETHANOL  SALICYLATE LEVEL  ACETAMINOPHEN LEVEL  URINE RAPID DRUG SCREEN, HOSP PERFORMED    Imaging Review No results found. I have personally reviewed and evaluated these images and lab results as part of my medical decision-making.   EKG Interpretation None      Results for orders placed or performed during the hospital encounter of 01/14/15  Comprehensive metabolic panel  Result Value Ref Range   Sodium 141 135 - 145 mmol/L   Potassium 3.9 3.5 - 5.1 mmol/L   Chloride 108 101 - 111 mmol/L   CO2 25 22 - 32 mmol/L   Glucose, Bld 98 65 - 99 mg/dL   BUN 10 6 - 20 mg/dL   Creatinine, Ser 0.67 0.44 - 1.00 mg/dL   Calcium 9.2 8.9 - 10.3 mg/dL   Total Protein 7.6 6.5 - 8.1 g/dL   Albumin 4.2 3.5 - 5.0 g/dL   AST 25 15 - 41 U/L   ALT 25 14 - 54 U/L   Alkaline Phosphatase 55 38 - 126 U/L   Total Bilirubin 0.5 0.3 - 1.2 mg/dL   GFR calc non Af Amer >60 >60 mL/min   GFR calc Af Amer >60 >60 mL/min   Anion gap 8 5 - 15  Ethanol (ETOH)  Result Value Ref Range   Alcohol, Ethyl (B) <5 <5 mg/dL  Salicylate level  Result Value Ref Range   Salicylate Lvl <5.6 2.8 - 30.0 mg/dL  Acetaminophen level  Result Value Ref Range   Acetaminophen (Tylenol), Serum <10 (L) 10 - 30 ug/mL  CBC  Result Value Ref Range   WBC 6.9 4.0 - 10.5 K/uL   RBC 4.94 3.87 - 5.11 MIL/uL   Hemoglobin 12.9 12.0 - 15.0 g/dL   HCT 40.6 36.0 - 46.0 %   MCV 82.2 78.0 - 100.0 fL   MCH 26.1 26.0 - 34.0 pg   MCHC 31.8 30.0 - 36.0 g/dL   RDW 14.6 11.5 - 15.5 %   Platelets 224 150 - 400 K/uL   No results found.   MDM  TTS consulted for inpatient psychiatric bed  Placement Dx Suicidal ideation Final  diagnoses:  None        Orlie Dakin, MD 01/14/15 1549

## 2015-01-14 NOTE — ED Notes (Signed)
Bed: LE75 Expected date:  Expected time:  Means of arrival:  Comments: Triage 4

## 2015-01-14 NOTE — BH Assessment (Signed)
Assessment Note   [name] is an 24 y.o. female who came to the Emergency Department with complaints of depression and SI due to a break up with her boyfriend that happened 1 month ago. She states that she had a plan to overdose on her medication. She was recently discharged from [facility] the end of August this year. She states that it "didn't really help because the medication hasn't started working". She states that she was given the invega shot but she doesn't feel like her depression has gotten better. She says that she currently has her own place but is living with her mom at the moment. She states that she doesn't have much of a relationship with her mom and her mom doesn't speak to her much. She denies having any other support people in her life. She says she is on disibility and has 3 children who she put up for adoption. She would not elaborate on why she put her children up for adoption but stated that it was because "her baby's hair fell out". What asked if her child was sick she stated "no". She says that she goes to [facility] to get her prescriptions filled and has a long history of depression. Pt was very cooperative but soft spoken and states she feels she needs to go back to the hospital or she is afraid she will hurt herself. She denies use of any substances, HI or A/V hallucinations at this time.   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 Anxiety   . Mental disorder   . Depression   . Cognitive deficits   . Bipolar 1 disorder   . Diabetes mellitus without complication    Axis IV: economic problems, other psychosocial or environmental problems and problems with primary support group Axis V: 41-50 serious symptoms  Past Medical History:  Past Medical History  Diagnosis Date  . Anxiety   . Mental disorder   . Depression   .  Cognitive deficits   . Bipolar 1 disorder   . Diabetes mellitus without complication     Past Surgical History  Procedure Laterality Date  . Cesarean section    . Tonsillectomy N/A 06/03/2012    Procedure: TONSILLECTOMY;  Surgeon: [name], MD;  Location: [facility];  Service: ENT;  Laterality: N/A;  . Mass excision N/A 06/03/2012    Procedure: EXCISION MASS;  Surgeon: [name], MD;  Location: [facility];  Service: ENT;  Laterality: N/A;  Excision uvula mass  . Cesarean section N/A 04/25/2013    Procedure: REPEAT CESAREAN SECTION;  Surgeon: [name], MD;  Location: [facility];  Service: Obstetrics;  Laterality: N/A;  . Tonsillectomy      Family History:  Family History  Problem Relation Age of Onset  . Hypertension Mother   . Diabetes Father     Social History:  reports that she has been smoking Cigarettes.  She has smoked for the past 7 years. She has never used smokeless tobacco. She reports that she does not drink alcohol or use illicit drugs.  Additional Social History:  Alcohol / Drug Use History of alcohol / drug use?: No history of alcohol / drug abuse  CIWA: CIWA-Ar BP: 120/67 mmHg Pulse Rate: 90 COWS:    PATIENT STRENGTHS: (choose at least two) Average or above average intelligence Motivation for  treatment/growth   Home Medications:  (Not in a hospital admission)  OB/GYN Status:  Patient's last menstrual period was 12/30/2014 (approximate).  General Assessment Data Location of Assessment: [facility] TTS Assessment: In system Is this a Tele or Face-to-Face Assessment?: Face-to-Face Is this an Initial Assessment or a Re-assessment for this encounter?: Initial Assessment Marital status: Single Is patient pregnant?: No Pregnancy Status: No Living Arrangements:  (states she lives with mom) Can pt return to current living arrangement?: Yes Admission Status: Voluntary Is patient capable of signing voluntary admission?:  Yes Referral Source: Self/Family/Friend Insurance type: MCD     Crisis Care Plan Living Arrangements:  (states she lives with mom) Name of Psychiatrist: Warden/ranger Name of Therapist: None  Education Status Is patient currently in school?: No Highest grade of school patient has completed: 11th  Risk to self with the past 6 months Suicidal Ideation: Yes-Currently Present Has patient been a risk to self within the past 6 months prior to admission? : Yes Suicidal Intent: Yes-Currently Present Has patient had any suicidal intent within the past 6 months prior to admission? : Yes Is patient at risk for suicide?: Yes Suicidal Plan?: Yes-Currently Present Has patient had any suicidal plan within the past 6 months prior to admission? : Yes Specify Current Suicidal Plan: Overdose on medication Access to Means: Yes Specify Access to Suicidal Means: has access to medications What has been your use of drugs/alcohol within the last 12 months?: pt denies use of drugs or alcohol  Previous Attempts/Gestures: Yes How many times?: 3 Other Self Harm Risks: None Triggers for Past Attempts: Other (Comment) (break up with boyfriend) Intentional Self Injurious Behavior: None Family Suicide History: No Recent stressful life event(s): Conflict (Comment) (break up with boyfriend) Persecutory voices/beliefs?: Yes Depression: Yes Depression Symptoms: Despondent Substance abuse history and/or treatment for substance abuse?: No Suicide prevention information given to non-admitted patients: Not applicable  Risk to Others within the past 6 months Homicidal Ideation: No Does patient have any lifetime risk of violence toward others beyond the six months prior to admission? : No Thoughts of Harm to Others: No Current Homicidal Intent: No Current Homicidal Plan: No Access to Homicidal Means: No Identified Victim: N/A  History of harm to others?: No Assessment of Violence: None Noted Violent Behavior  Description: None Does patient have access to weapons?: No Criminal Charges Pending?: No Describe Pending Criminal Charges: none Does patient have a court date: No Is patient on probation?: No  Psychosis Hallucinations: None noted Delusions: None noted  Mental Status Report Appearance/Hygiene: Disheveled Eye Contact: Good Motor Activity: Freedom of movement Speech: Logical/coherent Level of Consciousness: Quiet/awake Mood: Depressed Affect: Appropriate to circumstance Anxiety Level: Minimal Thought Processes: Coherent Judgement: Impaired Orientation: Appropriate for developmental age Obsessive Compulsive Thoughts/Behaviors: None  Cognitive Functioning Concentration: Decreased Memory: Recent Intact, Remote Intact IQ: Average Insight: Fair Impulse Control: Poor Appetite: Good Weight Loss: 0 Weight Gain: 0 Sleep: No Change Total Hours of Sleep: 8 Vegetative Symptoms: None  ADLScreening [facility] Assessment Services) Patient's cognitive ability adequate to safely complete daily activities?: Yes Patient able to express need for assistance with ADLs?: Yes Independently performs ADLs?: Yes (appropriate for developmental age)  Prior Inpatient Therapy Prior Inpatient Therapy: Yes Prior Therapy Dates: 2016 Prior Therapy Facilty/Provider(s): [facility] Reason for Treatment: SI   Prior Outpatient Therapy Prior Outpatient Therapy: Yes Prior Therapy Dates: ongoing Prior Therapy Facilty/Provider(s): [facility] Reason for Treatment: Med management  Does patient have an ACCT team?: No Does patient have Intensive In-House Services?  :  No Does patient have [facility] services? : Yes Does patient have P4CC services?: No  ADL Screening (condition at time of admission) Patient's cognitive ability adequate to safely complete daily activities?: Yes Is the patient deaf or have difficulty hearing?: No Does the patient have difficulty seeing, even when wearing glasses/contacts?: No Does the patient  have difficulty concentrating, remembering, or making decisions?: No Patient able to express need for assistance with ADLs?: Yes Does the patient have difficulty dressing or bathing?: No Independently performs ADLs?: Yes (appropriate for developmental age) Does the patient have difficulty walking or climbing stairs?: No Weakness of Legs: None Weakness of Arms/Hands: None  Home Assistive Devices/Equipment Home Assistive Devices/Equipment: None  Therapy Consults (therapy consults require a physician order) PT Evaluation Needed: No OT Evalulation Needed: No SLP Evaluation Needed: No Abuse/Neglect Assessment (Assessment to be complete while patient is alone) Physical Abuse: Denies Verbal Abuse: Denies Sexual Abuse: Denies Exploitation of patient/patient's resources: Denies Self-Neglect: Denies Values / Beliefs Cultural Requests During Hospitalization: None Spiritual Requests During Hospitalization: None Consults Spiritual Care Consult Needed: No Social Work Consult Needed: No Regulatory affairs officer (For Healthcare) Does patient have an advance directive?: No Would patient like information on creating an advanced directive?: No - patient declined information    Additional Information 1:1 In Past 12 Months?: No CIRT Risk: No Elopement Risk: No Does patient have medical clearance?: Yes     Disposition:  Disposition Initial Assessment Completed for this Encounter: Yes Disposition of Patient: Inpatient treatment program Type of inpatient treatment program: Adult  Cheshire,Kristin 01/14/2015 3:20 PM

## 2015-01-14 NOTE — ED Notes (Signed)
Pt having depression with suicidal thoughts.  Pt broke up with boyfriend x 1 month ago.  Not wanting to stay in home.  Plan to take pills. Denies attempt today.

## 2015-01-14 NOTE — ED Notes (Addendum)
Pt stated,"I can not live at home anymore. I am afraid and want to hurt myself." Pt denies anymore harrasment from the bus driver. She was given lunch and appears very pleasant,. Pt does contract for safety. She is withdrawn and very quiet. 2:20p-Report given and pt will go to bed 43 in the [facility].

## 2015-01-15 ENCOUNTER — Observation Stay (HOSPITAL_COMMUNITY)
Admission: AD | Admit: 2015-01-15 | Discharge: 2015-01-16 | Disposition: A | Discharge status: Home / Self Care | Payer: Medicaid Other | Source: Intra-hospital | Attending: Psychiatry | Admitting: Psychiatry

## 2015-01-15 DIAGNOSIS — F411 Generalized anxiety disorder: Secondary | ICD-10-CM

## 2015-01-15 DIAGNOSIS — F819 Developmental disorder of scholastic skills, unspecified: Secondary | ICD-10-CM | POA: Insufficient documentation

## 2015-01-15 DIAGNOSIS — F333 Major depressive disorder, recurrent, severe with psychotic symptoms: Secondary | ICD-10-CM | POA: Diagnosis not present

## 2015-01-15 DIAGNOSIS — F332 Major depressive disorder, recurrent severe without psychotic features: Secondary | ICD-10-CM | POA: Diagnosis not present

## 2015-01-15 DIAGNOSIS — E119 Type 2 diabetes mellitus without complications: Secondary | ICD-10-CM | POA: Insufficient documentation

## 2015-01-15 DIAGNOSIS — F17210 Nicotine dependence, cigarettes, uncomplicated: Secondary | ICD-10-CM | POA: Insufficient documentation

## 2015-01-15 DIAGNOSIS — R45851 Suicidal ideations: Secondary | ICD-10-CM | POA: Diagnosis not present

## 2015-01-15 MED ORDER — MAGNESIUM HYDROXIDE 400 MG/5ML PO SUSP: 30.0000 mL | Freq: Every day | ORAL | Status: DC | PRN

## 2015-01-15 MED ORDER — PALIPERIDONE ER 3 MG PO TB24
6.0000 mg | ORAL_TABLET | Freq: Every day | ORAL | Status: DC
  Administered 2015-01-15: 6 mg via ORAL

## 2015-01-15 MED ORDER — ALUM & MAG HYDROXIDE-SIMETH 200-200-20 MG/5ML PO SUSP
30.0000 mL | ORAL | Status: DC | PRN
Start: 2015-01-15 — End: 2015-01-16

## 2015-01-15 MED ORDER — ACETAMINOPHEN 325 MG PO TABS: 650.0000 mg | ORAL_TABLET | Freq: Four times a day (QID) | ORAL | Status: DC | PRN

## 2015-01-15 MED ORDER — DULOXETINE HCL 30 MG PO CPEP
90.0000 mg | ORAL_CAPSULE | Freq: Every day | ORAL | Status: DC
  Administered 2015-01-16: 90 mg via ORAL
  Filled 2015-01-15 (×2): qty 1

## 2015-01-15 MED ORDER — HYDROXYZINE HCL 25 MG PO TABS: 25.0000 mg | ORAL_TABLET | Freq: Four times a day (QID) | ORAL | Status: DC | PRN

## 2015-01-15 MED ORDER — PALIPERIDONE PALMITATE 117 MG/0.75ML IM SUSP: 117.0000 mg | INTRAMUSCULAR | Status: DC

## 2015-01-15 MED ORDER — METFORMIN HCL 500 MG PO TABS
500.0000 mg | ORAL_TABLET | Freq: Two times a day (BID) | ORAL | Status: DC
  Administered 2015-01-15 – 2015-01-16 (×2): 500 mg via ORAL
  Filled 2015-01-15 (×2): qty 1

## 2015-01-15 MED ORDER — BUSPIRONE HCL 5 MG PO TABS
5.0000 mg | ORAL_TABLET | Freq: Three times a day (TID) | ORAL | Status: DC
  Administered 2015-01-15 – 2015-01-16 (×2): 5 mg via ORAL
  Filled 2015-01-15 (×2): qty 1

## 2015-01-15 NOTE — H&P (Signed)
Psychiatric Admission Assessment Adult  Patient Identification: [name] MRN:  [id number] Date of Evaluation:  01/15/2015 Chief Complaint:  "I want to stop cutting my hair off because I get so nervous."  Principal Diagnosis: Generalized anxiety disorder Diagnosis:   Patient Active Problem List   Diagnosis Date Noted  . Suicidal ideation [R45.851]   . Severe recurrent major depressive disorder with psychotic features [F33.3]   . Recurrent major depression-severe [F33.2] 12/22/2014  . Drug overdose [T50.901A]   . Cognitive deficits [R41.89] 10/12/2012  . Generalized anxiety disorder [F41.1] 06/28/2012   History of Present Illness::  [name] is an 24 y.o. female who came to the Emergency Department with complaints of depression and SI due to a break up with her boyfriend that happened 1 month ago. She states that she had a plan to overdose on her medication. She was recently discharged from [facility] the end of August this year. Patient was admitted to the [facility] unit for further evaluation. The patient appeared to have some trouble processing information during the assessment and reports receiving Disability for a Learning Disability. Patient stated several times her desire to stop "cutting her hair short like a woman I saw." But upon further assessment is reporting high levels of anxiety rating it at eight today. Patient stated "I overdosed on medications last time. I was thinking of doing that again. My nerves are bad. I miss my boyfriend because he made me laugh. But I had to part ways with him because of his drug and alcohol abuse. He also cheated on me. I do not use drugs. I feel the medications working some but still have bad anxiety. I'm more relaxed since the Cymbalta was started last time I was here." She endorses some paranoia but when asked stated "My boyfriend used to hang out with crack dealers. I think they still come around where I live." Denies hearing any voices.  Patient reports feeling less suicidal since being admitted to the hospital. The patient stated "I think I could leave here now." When asked what would be different in regards to suicidal thoughts stated "I could talk to my mother." Patient informed of need for observation for a period of time due to current suicidal thoughts today along with a past recent attempt. The patient also appears to be impulsive in her actions. Her hair was observed to be cut very short in an erratic fashion. The patient describes spending her days inside thinking about her boyfriend. Patient was encouraged to make some lifestyle modification after discharge such as walking around her neighborhood and avoiding foods she worries are causing her to gain weight.    Elements:  Location:  depression anxiety  Quality:  increasingly more anxious and depressed, having trouble accepting breakup with boyfriend Severity:  moderate. Timing:  every day. Duration:  worst for the last week. Context: Rumination over stressors, poor coping skills Associated Signs/Symptoms: Depression Symptoms:  depressed mood, anhedonia, hypersomnia, fatigue, difficulty concentrating, suicidal thoughts with specific plan, anxiety, loss of energy/fatigue, disturbed sleep, (Hypo) Manic Symptoms:  denies Anxiety Symptoms:  Excessive Worry, Psychotic Symptoms:  denies PTSD Symptoms: Negative Total Time spent with patient: 45 minutes  Past Medical History:  Past Medical History  Diagnosis Date  . Anxiety   . Mental disorder   . Depression   . Cognitive deficits   . Bipolar 1 disorder   . Diabetes mellitus without complication     Past Surgical History  Procedure Laterality Date  . Cesarean  section    . Tonsillectomy N/A 06/03/2012    Procedure: TONSILLECTOMY;  Surgeon: [name], MD;  Location: [place];  Service: ENT;  Laterality: N/A;  . Mass excision N/A 06/03/2012    Procedure: EXCISION MASS;  Surgeon: [name], MD;  Location: [place];  Service: ENT;  Laterality: N/A;  Excision uvula mass  . Cesarean section N/A 04/25/2013    Procedure: REPEAT CESAREAN SECTION;  Surgeon: [name], MD;  Location: [facility];  Service: Obstetrics;  Laterality: N/A;  . Tonsillectomy     Family History:  Family History  Problem Relation Age of Onset  . Hypertension Mother   . Diabetes Father   aunt had a nervous [place] Social History:  History  Alcohol Use No     History  Drug Use No    Comment: Patient denies    Social History   Social History  . Marital Status: Single    Spouse Name: N/A  . Number of Children: N/A  . Years of Education: N/A   Social History Main Topics  . Smoking status: Current Some Day Smoker -- 7 years    Types: Cigarettes  . Smokeless tobacco: Never Used  . Alcohol Use: No  . Drug Use: No     Comment: Patient denies  . Sexual Activity: Yes    Birth Control/ Protection: Implant   Other Topics Concern  . Not on file   Social History Narrative  has her own place but stays with her mother as does not feel safe there. 11 th grade got pregnant. States she has 3 daughters all adopted. States the depression did not let her take care of them. States on disability for learning disability. single Additional Social History:    History of alcohol / drug use?: No history of alcohol / drug abuse                     Musculoskeletal: Strength & Muscle Tone: within normal limits Gait & Station: normal Patient leans: normal  Psychiatric Specialty Exam: Physical Exam  Constitutional:  Complete physical exam completed in the [facility] and I concur with no noted exceptions.     Review of Systems  Constitutional: Negative.   HENT: Negative.   Eyes: Negative.   Respiratory:       Half a pack a day  Cardiovascular: Negative.   Gastrointestinal: Negative.   Genitourinary: Negative.   Musculoskeletal: Negative.   Skin: Negative.   Neurological:  Negative.   Endo/Heme/Allergies: Negative.   Psychiatric/Behavioral: Positive for depression and suicidal ideas. Negative for hallucinations, memory loss and substance abuse. The patient is nervous/anxious. The patient does not have insomnia.     Blood pressure 125/80, pulse 101, temperature 97.8 F (36.6 C), temperature source Oral, resp. rate 20, height '5\' 7"'  (1.702 m), weight 117.935 kg (260 lb), last menstrual period 12/30/2014, not currently breastfeeding.Body mass index is 40.71 kg/(m^2).  General Appearance: Fairly Groomed  Engineer, water::  Fair  Speech:  Clear and Coherent  Volume:  Normal  Mood:  Anxious and Depressed  Affect:  Restricted  Thought Process:  Coherent and Goal Directed  Orientation:  Full (Time, Place, and Person)  Thought Content:  symptoms events worries concerns  Suicidal Thoughts:  Yes.  with intent/plan to overdose on medications  Homicidal Thoughts:  No  Memory:  Immediate;   Fair Recent;   Fair Remote;   Fair  Judgement:  Fair  Insight:  Present and  Shallow  Psychomotor Activity:  Restlessness  Concentration:  Fair  Recall:  [place]: Fair  Akathisia:  No  Handed:  Right  AIMS (if indicated):     Assets:  Communication Skills Desire for Improvement Leisure Time  ADL's:  Intact  Cognition: WNL  Sleep:      Risk to Self: Is patient at risk for suicide?: Yes Risk to Others:   Prior Inpatient Therapy:  [place], [place]  Prior Outpatient Therapy:  [name] went last month but meds "do not work"   Alcohol Screening: 1. How often do you have a drink containing alcohol?: Never 9. Have you or someone else been injured as a result of your drinking?: No 10. Has a relative or friend or a doctor or another health worker been concerned about your drinking or suggested you cut down?: No Alcohol Use Disorder Identification Test Final Score (AUDIT): 0  Allergies:   Penicillins   Lab Results:  Results for  orders placed or performed during the hospital encounter of 01/14/15 (from the past 48 hour(s))  Comprehensive metabolic panel     Status: None   Collection Time: 01/14/15 12:54 PM  Result Value Ref Range   Sodium 141 135 - 145 mmol/L   Potassium 3.9 3.5 - 5.1 mmol/L   Chloride 108 101 - 111 mmol/L   CO2 25 22 - 32 mmol/L   Glucose, Bld 98 65 - 99 mg/dL   BUN 10 6 - 20 mg/dL   Creatinine, Ser 0.67 0.44 - 1.00 mg/dL   Calcium 9.2 8.9 - 10.3 mg/dL   Total Protein 7.6 6.5 - 8.1 g/dL   Albumin 4.2 3.5 - 5.0 g/dL   AST 25 15 - 41 U/L   ALT 25 14 - 54 U/L   Alkaline Phosphatase 55 38 - 126 U/L   Total Bilirubin 0.5 0.3 - 1.2 mg/dL   GFR calc non Af Amer >60 >60 mL/min   GFR calc Af Amer >60 >60 mL/min    Comment: (NOTE) The eGFR has been calculated using the CKD EPI equation. This calculation has not been validated in all clinical situations. eGFR's persistently <60 mL/min signify possible Chronic Kidney Disease.    Anion gap 8 5 - 15  Ethanol (ETOH)     Status: None   Collection Time: 01/14/15 12:54 PM  Result Value Ref Range   Alcohol, Ethyl (B) <5 <5 mg/dL    Comment:        LOWEST DETECTABLE LIMIT FOR SERUM ALCOHOL IS 5 mg/dL FOR MEDICAL PURPOSES ONLY   Salicylate level     Status: None   Collection Time: 01/14/15 12:54 PM  Result Value Ref Range   Salicylate Lvl <4.3 2.8 - 30.0 mg/dL  Acetaminophen level     Status: Abnormal   Collection Time: 01/14/15 12:54 PM  Result Value Ref Range   Acetaminophen (Tylenol), Serum <10 (L) 10 - 30 ug/mL    Comment:        THERAPEUTIC CONCENTRATIONS VARY SIGNIFICANTLY. A RANGE OF 10-30 ug/mL MAY BE AN EFFECTIVE CONCENTRATION FOR MANY PATIENTS. HOWEVER, SOME ARE BEST TREATED AT CONCENTRATIONS OUTSIDE THIS RANGE. ACETAMINOPHEN CONCENTRATIONS >150 ug/mL AT 4 HOURS AFTER INGESTION AND >50 ug/mL AT 12 HOURS AFTER INGESTION ARE OFTEN ASSOCIATED WITH TOXIC REACTIONS.   CBC     Status: None   Collection Time: 01/14/15 12:54 PM   Result Value Ref Range   WBC 6.9 4.0 - 10.5 K/uL   RBC  4.94 3.87 - 5.11 MIL/uL   Hemoglobin 12.9 12.0 - 15.0 g/dL   HCT 40.6 36.0 - 46.0 %   MCV 82.2 78.0 - 100.0 fL   MCH 26.1 26.0 - 34.0 pg   MCHC 31.8 30.0 - 36.0 g/dL   RDW 14.6 11.5 - 15.5 %   Platelets 224 150 - 400 K/uL  Urine rapid drug screen (hosp performed) (Not at [facility])     Status: None   Collection Time: 01/14/15  3:30 PM  Result Value Ref Range   Opiates NONE DETECTED NONE DETECTED   Cocaine NONE DETECTED NONE DETECTED   Benzodiazepines NONE DETECTED NONE DETECTED   Amphetamines NONE DETECTED NONE DETECTED   Tetrahydrocannabinol NONE DETECTED NONE DETECTED   Barbiturates NONE DETECTED NONE DETECTED    Comment:        DRUG SCREEN FOR MEDICAL PURPOSES ONLY.  IF CONFIRMATION IS NEEDED FOR ANY PURPOSE, NOTIFY LAB WITHIN 5 DAYS.        LOWEST DETECTABLE LIMITS FOR URINE DRUG SCREEN Drug Class       Cutoff (ng/mL) Amphetamine      1000 Barbiturate      200 Benzodiazepine   893 Tricyclics       810 Opiates          300 Cocaine          300 THC              50    Current Medications: Current Facility-Administered Medications  Medication Dose Route Frequency Provider Last Rate Last Dose  . acetaminophen (TYLENOL) tablet 650 mg  650 mg Oral Q6H PRN Delfin Gant, NP      . alum & mag hydroxide-simeth (MAALOX/MYLANTA) 200-200-20 MG/5ML suspension 30 mL  30 mL Oral Q4H PRN Delfin Gant, NP      . busPIRone (BUSPAR) tablet 5 mg  5 mg Oral TID Niel Hummer, NP      . Derrill Memo ON 01/16/2015] DULoxetine (CYMBALTA) DR capsule 90 mg  90 mg Oral Daily Delfin Gant, NP      . magnesium hydroxide (MILK OF MAGNESIA) suspension 30 mL  30 mL Oral Daily PRN Delfin Gant, NP      . metFORMIN (GLUCOPHAGE) tablet 500 mg  500 mg Oral BID WC Delfin Gant, NP   500 mg at 01/15/15 1702  . paliperidone (INVEGA) 24 hr tablet 6 mg  6 mg Oral QHS Delfin Gant, NP      . Derrill Memo ON 01/28/2015] Paliperidone  Palmitate SUSP 117 mg  117 mg Intramuscular Q28 days Delfin Gant, NP       PTA Medications: Prescriptions prior to admission  Medication Sig Dispense Refill Last Dose  . DULoxetine (CYMBALTA) 30 MG capsule Take 3 capsules (90 mg total) by mouth daily. For depression 30 capsule 0 01/13/2015 at Unknown time  . metFORMIN (GLUCOPHAGE) 500 MG tablet Take 1 tablet (500 mg total) by mouth 2 (two) times daily. For diabetes management  1 01/14/2015 at Unknown time  . paliperidone (INVEGA SUSTENNA) 156 MG/ML SUSP injection Inject 1 mL (156 mg total) into the muscle once. (Due 01-01-15): For mood control 0.9 mL 0 01/01/2015  . paliperidone (INVEGA) 6 MG 24 hr tablet Take 1 tablet (6 mg total) by mouth at bedtime. For mood control 30 tablet 0 01/13/2015 at Unknown time  . [START ON 01/29/2015] Paliperidone Palmitate (INVEGA SUSTENNA) 117 MG/0.75ML SUSP Inject 117 mg into the muscle every 28 (twenty-eight) days. (  Due on 01-28-15): For mood control 0.9 mL 0     Previous Psychotropic Medications: Yes Invega Lithium Wellbutrin Abilify Cymbalta Prozac Latuda   Substance Abuse History in the last 12 months:  No.    Consequences of Substance Abuse: Negative  Results for orders placed or performed during the hospital encounter of 01/14/15 (from the past 72 hour(s))  Comprehensive metabolic panel     Status: None   Collection Time: 01/14/15 12:54 PM  Result Value Ref Range   Sodium 141 135 - 145 mmol/L   Potassium 3.9 3.5 - 5.1 mmol/L   Chloride 108 101 - 111 mmol/L   CO2 25 22 - 32 mmol/L   Glucose, Bld 98 65 - 99 mg/dL   BUN 10 6 - 20 mg/dL   Creatinine, Ser 0.67 0.44 - 1.00 mg/dL   Calcium 9.2 8.9 - 10.3 mg/dL   Total Protein 7.6 6.5 - 8.1 g/dL   Albumin 4.2 3.5 - 5.0 g/dL   AST 25 15 - 41 U/L   ALT 25 14 - 54 U/L   Alkaline Phosphatase 55 38 - 126 U/L   Total Bilirubin 0.5 0.3 - 1.2 mg/dL   GFR calc non Af Amer >60 >60 mL/min   GFR calc Af Amer >60 >60 mL/min    Comment: (NOTE) The eGFR has  been calculated using the CKD EPI equation. This calculation has not been validated in all clinical situations. eGFR's persistently <60 mL/min signify possible Chronic Kidney Disease.    Anion gap 8 5 - 15  Ethanol (ETOH)     Status: None   Collection Time: 01/14/15 12:54 PM  Result Value Ref Range   Alcohol, Ethyl (B) <5 <5 mg/dL    Comment:        LOWEST DETECTABLE LIMIT FOR SERUM ALCOHOL IS 5 mg/dL FOR MEDICAL PURPOSES ONLY   Salicylate level     Status: None   Collection Time: 01/14/15 12:54 PM  Result Value Ref Range   Salicylate Lvl <4.1 2.8 - 30.0 mg/dL  Acetaminophen level     Status: Abnormal   Collection Time: 01/14/15 12:54 PM  Result Value Ref Range   Acetaminophen (Tylenol), Serum <10 (L) 10 - 30 ug/mL    Comment:        THERAPEUTIC CONCENTRATIONS VARY SIGNIFICANTLY. A RANGE OF 10-30 ug/mL MAY BE AN EFFECTIVE CONCENTRATION FOR MANY PATIENTS. HOWEVER, SOME ARE BEST TREATED AT CONCENTRATIONS OUTSIDE THIS RANGE. ACETAMINOPHEN CONCENTRATIONS >150 ug/mL AT 4 HOURS AFTER INGESTION AND >50 ug/mL AT 12 HOURS AFTER INGESTION ARE OFTEN ASSOCIATED WITH TOXIC REACTIONS.   CBC     Status: None   Collection Time: 01/14/15 12:54 PM  Result Value Ref Range   WBC 6.9 4.0 - 10.5 K/uL   RBC 4.94 3.87 - 5.11 MIL/uL   Hemoglobin 12.9 12.0 - 15.0 g/dL   HCT 40.6 36.0 - 46.0 %   MCV 82.2 78.0 - 100.0 fL   MCH 26.1 26.0 - 34.0 pg   MCHC 31.8 30.0 - 36.0 g/dL   RDW 14.6 11.5 - 15.5 %   Platelets 224 150 - 400 K/uL  Urine rapid drug screen (hosp performed) (Not at [facility])     Status: None   Collection Time: 01/14/15  3:30 PM  Result Value Ref Range   Opiates NONE DETECTED NONE DETECTED   Cocaine NONE DETECTED NONE DETECTED   Benzodiazepines NONE DETECTED NONE DETECTED   Amphetamines NONE DETECTED NONE DETECTED   Tetrahydrocannabinol NONE DETECTED NONE DETECTED  Barbiturates NONE DETECTED NONE DETECTED    Comment:        DRUG SCREEN FOR MEDICAL PURPOSES ONLY.  IF  CONFIRMATION IS NEEDED FOR ANY PURPOSE, NOTIFY LAB WITHIN 5 DAYS.        LOWEST DETECTABLE LIMITS FOR URINE DRUG SCREEN Drug Class       Cutoff (ng/mL) Amphetamine      1000 Barbiturate      200 Benzodiazepine   373 Tricyclics       578 Opiates          300 Cocaine          300 THC              50     Observation Level/Precautions:  15 minute checks  Laboratory:  As per the ED  Psychotherapy:  Individual   Medications:  Continue Cymbalta 90 mg daily, Invega as ordered, Start Buspar 5 mg TID for anxiety   Consultations:  As needed  Discharge Concerns:  Emotional stability   Estimated LOS: 24-48 hours  Other:  Increase collateral information    Psychological Evaluations: No   Treatment Plan Summary: Daily contact with patient to assess and evaluate symptoms and progress in treatment and Medication management  Admit to [place] unit due to safety concerns to be monitored overnight and reassessed in the morning.   Medical Decision Making:  Review of Psycho-Social Stressors (1), Review or order clinical lab tests (1), Review of Medication Regimen & Side Effects (2) and Review of New Medication or Change in Dosage (2)  I certify that [place] services furnished can reasonably be expected to improve the patient's condition.   Elmarie Shiley, NP-C 9/20/20165:35 PM

## 2015-01-15 NOTE — Progress Notes (Signed)
Pt admitted to [facility] observation unit.  Pt complains of "negative thinking" about ex boyfriend.  Pt states having current SI with a plan to OD and contracts for safety.  Pt states that she spent 4 days at [facility] about 2 to 3 weeks ago secondary to a suicide attempt by taking 5 to 6 cymbalta and 5 to 6 metformin.  Pt states she also "can't stand" living at home because she "gets bored."  Pt states she is anxious about how she cuts her own hair secondary to "nerves."  Pt gives minimal responses to questions.  Affect is sad and mood is depressed.  SKin assessment completed.  Admission paperwork reviewed and signed.  Pt shown to her bed on observation unit.

## 2015-01-15 NOTE — Progress Notes (Signed)
Patient  in bed resting at the beginning of this shift. Mood and affects  flat and depressed. Although she denied SI/HI and denied Hallucinations. Writer offered patient snacks, encouraged and supported her. Q 15 minute check continues as ordered for safety.

## 2015-01-15 NOTE — ED Notes (Signed)
Patient admits to [facility] with a plan to over dose on pills. Patient denies HI and AVH at this time. Plan of care discussed with patient. Encouragement and support provided and safety maintain. Q 15 min safety checks remain in place.

## 2015-01-15 NOTE — BH Assessment (Signed)
[facility] Assessment Progress Note  Per [name], MD, this pt would benefit from admission to the [facility] Observation Unit at this time.  Clayborne Dana, RN, [facility] has assigned pt to Obs 7.  Pt has signed Voluntary Admission and Consent for Treatment, as well as Consent to Release Information to [facility], her outpatient provider, and a notification call has been placed.  Signed forms have been faxed to [facility].  Pt's nurse, Chrys Racer, has been notified, and agrees to send original paperwork along with pt via [facility], and to call report to [phone].  Jalene Mullet, [place] Triage Specialist [phone]

## 2015-01-15 NOTE — Consult Note (Signed)
[name] Psychiatry Consult   Reason for Consult:  Recurrent Major Depression without Psychosis, Generalized anxiety disorder Referring Physician:  EDP Patient Identification: [name] MRN:  [id number] Principal Diagnosis: Generalized anxiety disorder Diagnosis:   Patient Active Problem List   Diagnosis Date Noted  . Suicidal ideation [R45.851]   . Severe recurrent major depressive disorder with psychotic features [F33.3]   . Recurrent major depression-severe [F33.2] 12/22/2014  . Drug overdose [T50.901A]   . Cognitive deficits [R41.89] 10/12/2012  . Generalized anxiety disorder [F41.1] 06/28/2012    Total Time spent with patient: 1 hour  Subjective:   [name] is a 24 y.o. [name] patient admitted with Recurrent Major Depression without Psychosis, Generalized anxiety disorder.  HPI:  [name], 24 years old well known to the service was evaluated today for anxiety.  Patient was hospitalized last month at our [facility] and was discharged to follow up with her outpatient provider.  Patient reports that she is afraid to stay in her apartment alone because drug dealers in her neighborhood are out to get her.   Patient reports that she is not safe at her apartment and have been spending some time with her mother.  Patient reports the increase in anxiety has led to her cutting her hair impulsively.   She reports that she suspects that her ex-boyfriend might hurt her although they live in different cities.  She reports that her injection Lorayne Bender is not effective and that she feels suicidal with plans to OD.  She reports previous suicide attempt this year by OD.     She is accepted for admission at our Observation unit.  HPI Elements:   Location:  Major depressive disorder, recurrent with Psychosis, Generalized anxiety disorder. Quality:  severe. Severity:  severe. Timing:  acute. Duration:  Chronic mental illness.. Context:  Came in for anxiety treatment and stabilization..  Past  Medical History:  Past Medical History  Diagnosis Date  . Anxiety   . Mental disorder   . Depression   . Cognitive deficits   . Bipolar 1 disorder   . Diabetes mellitus without complication     Past Surgical History  Procedure Laterality Date  . Cesarean section    . Tonsillectomy N/A 06/03/2012    Procedure: TONSILLECTOMY;  Surgeon: [name], MD;  Location: [place];  Service: ENT;  Laterality: N/A;  . Mass excision N/A 06/03/2012    Procedure: EXCISION MASS;  Surgeon: [name], MD;  Location: [place];  Service: ENT;  Laterality: N/A;  Excision uvula mass  . Cesarean section N/A 04/25/2013    Procedure: REPEAT CESAREAN SECTION;  Surgeon: [name], MD;  Location: [place] [facility];  Service: Obstetrics;  Laterality: N/A;  . Tonsillectomy     Family History:  Family History  Problem Relation Age of Onset  . Hypertension Mother   . Diabetes Father    Social History:  History  Alcohol Use No     History  Drug Use No    Comment: Patient denies    Social History   Social History  . Marital Status: Single    Spouse Name: N/A  . Number of Children: N/A  . Years of Education: N/A   Social History Main Topics  . Smoking status: Current Some Day Smoker -- 7 years    Types: Cigarettes  . Smokeless tobacco: Never Used  . Alcohol Use: No  . Drug Use: No     Comment: Patient denies  .  Sexual Activity: Yes    Birth Control/ Protection: Implant   Other Topics Concern  . None   Social History Narrative   Additional Social History:    History of alcohol / drug use?: No history of alcohol / drug abuse                     Allergies: Penicillin   Labs:  No results found for this or any previous visit (from the past 48 hour(s)).  Vitals: Blood pressure 125/67, pulse 107, temperature 98.4 F (36.9 C), temperature source Oral, resp. rate 16, last menstrual period 12/30/2014, SpO2 95 %, not currently breastfeeding.  Risk to  Self: Suicidal Ideation: Yes-Currently Present Suicidal Intent: Yes-Currently Present Is patient at risk for suicide?: Yes Suicidal Plan?: Yes-Currently Present Specify Current Suicidal Plan: Overdose on medication Access to Means: Yes Specify Access to Suicidal Means: has access to medications What has been your use of drugs/alcohol within the last 12 months?: pt denies use of drugs or alcohol  How many times?: 3 Other Self Harm Risks: None Triggers for Past Attempts: Other (Comment) (break up with boyfriend) Intentional Self Injurious Behavior: None Risk to Others: Homicidal Ideation: No Thoughts of Harm to Others: No Current Homicidal Intent: No Current Homicidal Plan: No Access to Homicidal Means: No Identified Victim: N/A  History of harm to others?: No Assessment of Violence: None Noted Violent Behavior Description: None Does patient have access to weapons?: No Criminal Charges Pending?: No Describe Pending Criminal Charges: none Does patient have a court date: No Prior Inpatient Therapy: Prior Inpatient Therapy: Yes Prior Therapy Dates: 2016 Prior Therapy Facilty/Provider(s): [facility] Reason for Treatment: SI  Prior Outpatient Therapy: Prior Outpatient Therapy: Yes Prior Therapy Dates: ongoing Prior Therapy Facilty/Provider(s): [facility] Reason for Treatment: Med management  Does patient have an ACCT team?: No Does patient have Intensive In-House Services?  : No Does patient have [facility] services? : Yes Does patient have [facility] services?: No  No current facility-administered medications for this encounter.   Current Outpatient Prescriptions  Medication Sig Dispense Refill  . busPIRone (BUSPAR) 5 MG tablet Take 1 tablet (5 mg total) by mouth 3 (three) times daily. 90 tablet 0  . DULoxetine (CYMBALTA) 30 MG capsule Take 3 capsules (90 mg total) by mouth daily. 90 capsule 0  . metFORMIN (GLUCOPHAGE) 500 MG tablet Take 1 tablet (500 mg total) by mouth 2 (two) times daily. For  diabetes management  1  . paliperidone (INVEGA) 6 MG 24 hr tablet Take 1 tablet (6 mg total) by mouth at bedtime. For mood control 30 tablet 0  . [START ON 01/28/2015] Paliperidone Palmitate (INVEGA SUSTENNA) 117 MG/0.75ML SUSP Inject 117 mg into the muscle every 28 (twenty-eight) days. Next dose due 01/28/2015. 0.9 mL 0    Musculoskeletal: Strength & Muscle Tone: within normal limits Gait & Station: normal Patient leans: N/A  Psychiatric Specialty Exam: Physical Exam  Review of Systems  Constitutional: Negative.   HENT: Negative.   Eyes: Negative.   Respiratory: Negative.   Cardiovascular: Negative.   Gastrointestinal: Negative.   Genitourinary: Negative.   Musculoskeletal: Negative.   Skin: Negative.   Neurological: Negative.   Endo/Heme/Allergies: Negative.        Hx of DM    Blood pressure 125/67, pulse 107, temperature 98.4 F (36.9 C), temperature source Oral, resp. rate 16, last menstrual period 12/30/2014, SpO2 95 %, not currently breastfeeding.There is no weight on file to calculate BMI.  General Appearance: Casual and Fairly Groomed  Eye Contact::  Good  Speech:  Clear and Coherent and Normal Rate  Volume:  Normal  Mood:  Anxious and Depressed  Affect:  Congruent and Depressed  Thought Process:  Coherent, Goal Directed and Intact  Orientation:  Full (Time, Place, and Person)  Thought Content:  Paranoid Ideation  Suicidal Thoughts:  Yes.  with intent/plan  Homicidal Thoughts:  No  Memory:  Immediate;   Good Recent;   Good Remote;   Good  Judgement:  Poor  Insight:  Good  Psychomotor Activity:  Psychomotor Retardation  Concentration:  Fair  Recall:  NA  Fund of Knowledge:Fair  Language: Good  Akathisia:  No  Handed:  Right  AIMS (if indicated):     Assets:  Desire for Improvement  ADL's:  Intact  Cognition: WNL  Sleep:      Medical Decision Making: Review of Psycho-Social Stressors (1), Established Problem, Worsening (2), Review of Medication Regimen &  Side Effects (2) and Review of New Medication or Change in Dosage (2)  Treatment Plan Summary: Daily contact with patient to assess and evaluate symptoms and progress in treatment and Medication management  Plan:  Resume home medications. Disposition:  Admit to observation unit  Delfin Gant   PMHNP-BC 01/16/2015 5:20 PM Patient seen face-to-face for psychiatric evaluation, chart reviewed and case discussed with the physician extender and developed treatment plan. Reviewed the information documented and agree with the treatment plan. [name], MD

## 2015-01-16 DIAGNOSIS — F411 Generalized anxiety disorder: Secondary | ICD-10-CM | POA: Diagnosis not present

## 2015-01-16 DIAGNOSIS — R45851 Suicidal ideations: Secondary | ICD-10-CM | POA: Diagnosis not present

## 2015-01-16 LAB — GLUCOSE, CAPILLARY: GLUCOSE-CAPILLARY: 122 mg/dL — AB (ref 65–99)

## 2015-01-16 MED ORDER — PALIPERIDONE ER 6 MG PO TB24
6.0000 mg | ORAL_TABLET | Freq: Every day | ORAL | Status: DC
Start: 2015-01-16 — End: 2015-02-19

## 2015-01-16 MED ORDER — PALIPERIDONE PALMITATE 117 MG/0.75ML IM SUSP: 117.0000 mg | INTRAMUSCULAR | Status: DC

## 2015-01-16 MED ORDER — DULOXETINE HCL 30 MG PO CPEP: 90.0000 mg | ORAL_CAPSULE | Freq: Every day | ORAL | Status: DC

## 2015-01-16 MED ORDER — METFORMIN HCL 500 MG PO TABS: 500.0000 mg | ORAL_TABLET | Freq: Two times a day (BID) | ORAL | Status: DC

## 2015-01-16 MED ORDER — BUSPIRONE HCL 5 MG PO TABS: 5.0000 mg | ORAL_TABLET | Freq: Three times a day (TID) | ORAL | Status: DC

## 2015-01-16 NOTE — Discharge Summary (Signed)
Physician Discharge Summary Note  Patient:  [name] is an 24 y.o., female MRN:  [id number] DOB:  23-Mar-1991 Patient phone:  [phone] (home)  Patient address:   [place] [address],  Total Time spent with patient: 30 minutes  Date of Admission:  01/15/2015 Date of Discharge: 01/16/2015  Reason for Admission:  [name] is an 24 y.o. female who came to the Emergency Department with complaints of depression and SI due to a break up with her boyfriend that happened 1 month ago. She states that she had a plan to overdose on her medication.   Principal Problem: Generalized anxiety disorder Discharge Diagnoses: Patient Active Problem List   Diagnosis Date Noted  . Suicidal ideation [R45.851]   . Severe recurrent major depressive disorder with psychotic features [F33.3]   . Recurrent major depression-severe [F33.2] 12/22/2014  . Drug overdose [T50.901A]   . Cognitive deficits [R41.89] 10/12/2012  . Generalized anxiety disorder [F41.1] 06/28/2012    Musculoskeletal: Strength & Muscle Tone: within normal limits Gait & Station: normal Patient leans: N/A  Psychiatric Specialty Exam: Physical Exam  Vitals reviewed.   Review of Systems  All other systems reviewed and are negative.   Blood pressure 103/66, pulse 98, temperature 98.1 F (36.7 C), temperature source Oral, resp. rate 20, height 5\' 7"  (1.702 m), weight 117.935 kg (260 lb), last menstrual period 12/30/2014, SpO2 99 %, not currently breastfeeding.Body mass index is 40.71 kg/(m^2).   General Appearance: Fairly Groomed  Engineer, water:: Fair  Speech: Clear and Coherent  Volume: Normal  Mood: Anxious and Depressed  Affect: Restricted  Thought Process: Coherent and Goal Directed  Orientation: Full (Time, Place, and Person)  Thought Content: symptoms events worries concerns  Suicidal Thoughts: Yes. with intent/plan to overdose on medications  Homicidal Thoughts: No   Memory: Immediate; Fair Recent; Fair Remote; Fair  Judgement: Fair  Insight: Present and Shallow  Psychomotor Activity: Restlessness  Concentration: Fair  Recall: [employer] of Knowledge:Fair  Language: Fair  Akathisia: No  Handed: Right  AIMS (if indicated):    Assets: Communication Skills Desire for Improvement Leisure Time  ADL's: Intact  Cognition: WNL  Sleep:  Fair       Have you used any form of tobacco in the last 30 days? (Cigarettes, Smokeless Tobacco, Cigars, and/or Pipes): Yes  Has this patient used any form of tobacco in the last 30 days? (Cigarettes, Smokeless Tobacco, Cigars, and/or Pipes) N/A  Past Medical History:  Past Medical History  Diagnosis Date  . Anxiety   . Mental disorder   . Depression   . Cognitive deficits   . Bipolar 1 disorder   . Diabetes mellitus without complication     Past Surgical History  Procedure Laterality Date  . Cesarean section    . Tonsillectomy N/A 06/03/2012    Procedure: TONSILLECTOMY;  Surgeon: [name], MD;  Location: [place];  Service: ENT;  Laterality: N/A;  . Mass excision N/A 06/03/2012    Procedure: EXCISION MASS;  Surgeon: [name], MD;  Location: [place];  Service: ENT;  Laterality: N/A;  Excision uvula mass  . Cesarean section N/A 04/25/2013    Procedure: REPEAT CESAREAN SECTION;  Surgeon: [name], MD;  Location: [facility];  Service: Obstetrics;  Laterality: N/A;  . Tonsillectomy     Family History:  Family History  Problem Relation Age of Onset  . Hypertension Mother   . Diabetes Father  Social History:  History  Alcohol Use No     History  Drug Use No    Comment: Patient denies    Social History   Social History  . Marital Status: Single    Spouse Name: N/A  . Number of Children: N/A  . Years of Education: N/A   Social History Main Topics  . Smoking status: Current Some Day Smoker -- 7 years    Types:  Cigarettes  . Smokeless tobacco: Never Used  . Alcohol Use: No  . Drug Use: No     Comment: Patient denies  . Sexual Activity: Yes    Birth Control/ Protection: Implant   Other Topics Concern  . Not on file   Social History Narrative   Risk to Self: Is patient at risk for suicide?: Yes Risk to Others:   Prior Inpatient Therapy:   Prior Outpatient Therapy:    Level of Care:  OP  Hospital Course:  [name] was admitted for Generalized anxiety disorder and crisis management.  She was treated discharged with the medications listed below under Medication List.  Medical problems were identified and treated as needed.  Home medications were restarted as appropriate.  Improvement was monitored by observation and [name] daily report of symptom reduction.  Emotional and mental status was monitored by daily self-inventory reports completed by [name] and clinical staff.         [name] was evaluated by the treatment team for stability and plans for continued recovery upon discharge.  [name] motivation was an integral factor for scheduling further treatment.  Employment, transportation, bed availability, health status, family support, and any pending legal issues were also considered during her hospital stay.  She was offered further treatment options upon discharge including but not limited to Residential, Intensive Outpatient, and Outpatient treatment.  [name] will follow up with the services as listed below under Follow Up Information.     Upon completion of this admission the patient was both mentally and medically stable for discharge denying suicidal/homicidal ideation, auditory/visual/tactile hallucinations, delusional thoughts and paranoia.      Consults:  psychiatry  Significant Diagnostic Studies:  labs: per ED  Discharge Vitals:   Blood pressure 103/66, pulse 98, temperature 98.1 F (36.7 C), temperature source Oral, resp. rate 20, height  5\' 7"  (1.702 m), weight 117.935 kg (260 lb), last menstrual period 12/30/2014, SpO2 99 %, not currently breastfeeding. Body mass index is 40.71 kg/(m^2). Lab Results:   Results for orders placed or performed during the hospital encounter of 01/15/15 (from the past 72 hour(s))  Glucose, capillary     Status: Abnormal   Collection Time: 01/16/15  5:53 AM  Result Value Ref Range   Glucose-Capillary 122 (H) 65 - 99 mg/dL    Physical Findings: AIMS: Facial and Oral Movements Muscles of Facial Expression: None, normal Lips and Perioral Area: None, normal Jaw: None, normal Tongue: None, normal,Extremity Movements Upper (arms, wrists, hands, fingers): None, normal Lower (legs, knees, ankles, toes): None, normal, Trunk Movements Neck, shoulders, hips: None, normal, Overall Severity Severity of abnormal movements (highest score from questions above): None, normal Incapacitation due to abnormal movements: None, normal Patient's awareness of abnormal movements (rate only patient's report): No Awareness, Dental Status Current problems with teeth and/or dentures?: No Does patient usually wear dentures?: No  CIWA:    COWS:      See Psychiatric Specialty Exam and Suicide Risk Assessment completed by Attending Physician  prior to discharge.  Discharge destination:  Home  Is patient on multiple antipsychotic therapies at discharge:  No   Has Patient had three or more failed trials of antipsychotic monotherapy by history:  No  Recommended Plan for Multiple Antipsychotic Therapies: NA     Medication List    TAKE these medications      Indication   busPIRone 5 MG tablet  Commonly known as:  BUSPAR  Take 1 tablet (5 mg total) by mouth 3 (three) times daily.   Indication:  Anxiety Disorder     DULoxetine 30 MG capsule  Commonly known as:  CYMBALTA  Take 3 capsules (90 mg total) by mouth daily.   Indication:  Generalized Anxiety Disorder, Major Depressive Disorder     metFORMIN 500 MG  tablet  Commonly known as:  GLUCOPHAGE  Take 1 tablet (500 mg total) by mouth 2 (two) times daily. For diabetes management   Indication:  Type 2 Diabetes     paliperidone 6 MG 24 hr tablet  Commonly known as:  INVEGA  Take 1 tablet (6 mg total) by mouth at bedtime. For mood control   Indication:  Mood control     Paliperidone Palmitate 117 MG/0.75ML Susp  Commonly known as:  INVEGA SUSTENNA  Inject 117 mg into the muscle every 28 (twenty-eight) days. Next dose due 01/28/2015.  Start taking on:  01/28/2015   Indication:  Mood control         Follow-up recommendations:  Activity:  as tol Diet:  as tol  Comments:  1.  Take all your medications as prescribed.              2.  Report any adverse side effects to outpatient provider.                       3.  Patient instructed to not use alcohol or illegal drugs while on prescription medicines.            4.  In the event of worsening symptoms, instructed patient to call 911, the crisis hotline or go to nearest emergency room for evaluation of symptoms.  Total Discharge Time: 30 min  Signed: Freda Munro May Avilyn Virtue AGNP-BC 01/16/2015, 1:53 PM

## 2015-01-16 NOTE — Progress Notes (Signed)
Patient ID: [name], female   DOB: Sep 24, 1990, 24 y.o.   MRN: [id number] Patient discharged per MD orders. Patient given education regarding follow-up appointments and medications. Patient denies any questions or concerns about these instructions. Patient was escorted to locker and given belongings before discharge to hospital lobby. Patient currently denies SI/HI and auditory and visual hallucinations on discharge.

## 2015-01-16 NOTE — BH Assessment (Signed)
Per Dr. Dwyane Dee the patient will be discharged and she will follow up with her current provider. Writer was able to speak the patients mother Marijo Quizon [phone]).  Per Maurice Small, the patient is able to come to her home.  Writer arranged for the patient to take a cab to her mother's home [address] in [place].  Patient reports that she is abel to follow up with her current mental health provider [facility].

## 2015-01-28 ENCOUNTER — Encounter (HOSPITAL_COMMUNITY): Payer: Self-pay | Admitting: *Deleted

## 2015-01-28 ENCOUNTER — Emergency Department (HOSPITAL_COMMUNITY)
Admission: EM | Admit: 2015-01-28 | Discharge: 2015-01-28 | Disposition: A | Discharge status: Home / Self Care | Payer: Medicaid Other | Attending: Emergency Medicine | Admitting: Emergency Medicine

## 2015-01-28 DIAGNOSIS — B3731 Acute candidiasis of vulva and vagina: Secondary | ICD-10-CM

## 2015-01-28 DIAGNOSIS — Z79899 Other long term (current) drug therapy: Secondary | ICD-10-CM | POA: Insufficient documentation

## 2015-01-28 DIAGNOSIS — F319 Bipolar disorder, unspecified: Secondary | ICD-10-CM | POA: Diagnosis not present

## 2015-01-28 DIAGNOSIS — B373 Candidiasis of vulva and vagina: Secondary | ICD-10-CM

## 2015-01-28 DIAGNOSIS — F419 Anxiety disorder, unspecified: Secondary | ICD-10-CM | POA: Diagnosis not present

## 2015-01-28 DIAGNOSIS — Z3202 Encounter for pregnancy test, result negative: Secondary | ICD-10-CM | POA: Insufficient documentation

## 2015-01-28 DIAGNOSIS — Z202 Contact with and (suspected) exposure to infections with a predominantly sexual mode of transmission: Secondary | ICD-10-CM | POA: Insufficient documentation

## 2015-01-28 DIAGNOSIS — N898 Other specified noninflammatory disorders of vagina: Secondary | ICD-10-CM | POA: Diagnosis present

## 2015-01-28 DIAGNOSIS — Z711 Person with feared health complaint in whom no diagnosis is made: Secondary | ICD-10-CM

## 2015-01-28 DIAGNOSIS — E119 Type 2 diabetes mellitus without complications: Secondary | ICD-10-CM | POA: Diagnosis not present

## 2015-01-28 DIAGNOSIS — Z880 Allergy status to penicillin: Secondary | ICD-10-CM | POA: Diagnosis not present

## 2015-01-28 DIAGNOSIS — Z720 Tobacco use: Secondary | ICD-10-CM | POA: Insufficient documentation

## 2015-01-28 LAB — URINALYSIS, ROUTINE W REFLEX MICROSCOPIC
BILIRUBIN URINE: NEGATIVE
GLUCOSE, UA: 100 mg/dL — AB
HGB URINE DIPSTICK: NEGATIVE
Ketones, ur: NEGATIVE mg/dL
Leukocytes, UA: NEGATIVE
Nitrite: NEGATIVE
Protein, ur: NEGATIVE mg/dL
SPECIFIC GRAVITY, URINE: 1.036 — AB (ref 1.005–1.030)
UROBILINOGEN UA: 0.2 mg/dL (ref 0.0–1.0)
pH: 5.5 (ref 5.0–8.0)

## 2015-01-28 LAB — URINE MICROSCOPIC-ADD ON

## 2015-01-28 LAB — POC URINE PREG, ED: Preg Test, Ur: NEGATIVE

## 2015-01-28 LAB — WET PREP, GENITAL
Clue Cells Wet Prep HPF POC: NONE SEEN
Trich, Wet Prep: NONE SEEN

## 2015-01-28 MED ORDER — FLUCONAZOLE 100 MG PO TABS
150.0000 mg | ORAL_TABLET | Freq: Once | ORAL | Status: AC
  Administered 2015-01-28: 150 mg via ORAL
  Filled 2015-01-28: qty 2

## 2015-01-28 MED ORDER — AZITHROMYCIN 1 G PO PACK
1.0000 g | PACK | Freq: Once | ORAL | Status: AC
  Administered 2015-01-28: 1 g via ORAL
  Filled 2015-01-28: qty 1

## 2015-01-28 MED ORDER — LIDOCAINE HCL (PF) 1 % IJ SOLN
5.0000 mL | Freq: Once | INTRAMUSCULAR | Status: AC
  Administered 2015-01-28: 5 mL
  Filled 2015-01-28: qty 5

## 2015-01-28 MED ORDER — CEFTRIAXONE SODIUM 250 MG IJ SOLR
250.0000 mg | Freq: Once | INTRAMUSCULAR | Status: AC
  Administered 2015-01-28: 250 mg via INTRAMUSCULAR
  Filled 2015-01-28: qty 250

## 2015-01-28 MED ORDER — DIPHENHYDRAMINE HCL 25 MG PO CAPS
25.0000 mg | ORAL_CAPSULE | Freq: Once | ORAL | Status: AC
  Administered 2015-01-28: 25 mg via ORAL
  Filled 2015-01-28: qty 1

## 2015-01-28 NOTE — ED Provider Notes (Signed)
CSN: [id number]     Arrival date & time 01/28/15  0848 History   First MD Initiated Contact with Patient 01/28/15 [phone]     Chief Complaint  Patient presents with  . Vaginal Discharge     (Consider location/radiation/quality/duration/timing/severity/associated sxs/prior Treatment) Patient is a 24 y.o. female presenting with vaginal discharge. The history is provided by the patient.  Vaginal Discharge Quality:  White and thick Severity:  Moderate Onset quality:  Gradual Duration:  3 days Timing:  Constant Progression:  Unchanged Chronicity:  Recurrent Associated symptoms: dysuria   Associated symptoms: no abdominal pain, no fever, no nausea and no vomiting   Risk factors: new sexual partner     Past Medical History  Diagnosis Date  . Anxiety   . Mental disorder   . Depression   . Cognitive deficits   . Bipolar 1 disorder ([place])   . Diabetes mellitus without complication [facility])    Past Surgical History  Procedure Laterality Date  . Cesarean section    . Tonsillectomy N/A 06/03/2012    Procedure: TONSILLECTOMY;  Surgeon: [name], MD;  Location: [place];  Service: ENT;  Laterality: N/A;  . Mass excision N/A 06/03/2012    Procedure: EXCISION MASS;  Surgeon: [name], MD;  Location: [place];  Service: ENT;  Laterality: N/A;  Excision uvula mass  . Cesarean section N/A 04/25/2013    Procedure: REPEAT CESAREAN SECTION;  Surgeon: [name], MD;  Location: [place] [facility];  Service: Obstetrics;  Laterality: N/A;  . Tonsillectomy     Family History  Problem Relation Age of Onset  . Hypertension Mother   . Diabetes Father    Social History  Substance Use Topics  . Smoking status: Current Some Day Smoker -- 7 years    Types: Cigarettes  . Smokeless tobacco: Never Used  . Alcohol Use: No   OB History    Gravida Para Term Preterm AB TAB SAB Ectopic Multiple Living   3 3 3  0 0 0 0 0 0 3     Review of Systems  Constitutional:  Negative for fever, chills and activity change.  Gastrointestinal: Negative for nausea, vomiting, abdominal pain and diarrhea.  Genitourinary: Positive for dysuria and vaginal discharge.  All other systems reviewed and are negative.  Allergies  Penicillins  Home Medications   Prior to Admission medications   Medication Sig Start Date End Date Taking? Authorizing Provider  busPIRone (BUSPAR) 5 MG tablet Take 1 tablet (5 mg total) by mouth 3 (three) times daily. 01/16/15   Kerrie Buffalo, NP  DULoxetine (CYMBALTA) 30 MG capsule Take 3 capsules (90 mg total) by mouth daily. 01/16/15   Kerrie Buffalo, NP  metFORMIN (GLUCOPHAGE) 500 MG tablet Take 1 tablet (500 mg total) by mouth 2 (two) times daily. For diabetes management 01/16/15   Kerrie Buffalo, NP  paliperidone (INVEGA) 6 MG 24 hr tablet Take 1 tablet (6 mg total) by mouth at bedtime. For mood control 01/16/15   Kerrie Buffalo, NP  Paliperidone Palmitate (INVEGA SUSTENNA) 117 MG/0.75ML SUSP Inject 117 mg into the muscle every 28 (twenty-eight) days. Next dose due 01/28/2015. 01/28/15   Kerrie Buffalo, NP   BP 137/91 mmHg  Pulse 104  Temp(Src) 98 F (36.7 C) (Oral)  Resp 18  SpO2 98%  LMP 12/30/2014 (LMP Unknown) Physical Exam  Constitutional: She is oriented to person, place, and time. She appears well-nourished. No distress.  HENT:  Head: Normocephalic.  Eyes: Pupils are equal, round, and reactive  to light.  Neck: Normal range of motion.  Cardiovascular: Regular rhythm.   Pulmonary/Chest: Effort normal.  Abdominal: Soft. She exhibits no distension. There is no tenderness.  Genitourinary: Vaginal discharge found.  White thick vaginal discharge  No CMT  No adnexal tenderness  Musculoskeletal: Normal range of motion.  Neurological: She is alert and oriented to person, place, and time.  Skin: Skin is warm and dry. She is not diaphoretic.  Psychiatric: Her behavior is normal.  Nursing note and vitals reviewed.   ED Course   Procedures (including critical care time) Labs Review Labs Reviewed  WET PREP, GENITAL - Abnormal; Notable for the following:    Yeast Wet Prep HPF POC FEW (*)    WBC, Wet Prep HPF POC FEW (*)    All other components within normal limits  URINALYSIS, ROUTINE W REFLEX MICROSCOPIC (NOT AT [facility]) - Abnormal; Notable for the following:    APPearance TURBID (*)    Specific Gravity, Urine 1.036 (*)    Glucose, UA 100 (*)    All other components within normal limits  URINE MICROSCOPIC-ADD ON  HIV ANTIBODY (ROUTINE TESTING)  RPR  POC URINE PREG, ED  GC/CHLAMYDIA PROBE AMP ([place]) NOT AT [facility]    MDM   Patient presents emergency department today with vaginal discharge for the past 3 days as well as pain with having sex and vaginal itching. Patient states she had a similar presentation a few months ago and had a yeast infection. She has no abdominal pain, no nausea, no vomiting, and no fevers.She is well appearing. She is concerned for having an STD as she has multiple partners and would like empiric treatment of G/C.  We went through education regarding safe sex and having her partner tested.   Patient's labs are remarkable for yeast infection. Treat her empirically for gonorrhea and chlamydia. We ordered HIV and syphilis at the request of the patient. She will have follow-up with these labs. We also gave her 1 dose of Diflucan. If this does not resolve she will follow up with her primary care physician or obtain over-the-counter medications. Patient was discharged home in good health.  Final diagnoses:  Concern about STD in female without diagnosis  Yeast infection of the vagina    Roberto Scales, MD 01/28/15 [place], MD 01/28/15 1530

## 2015-01-28 NOTE — ED Notes (Signed)
Pt is here with vaginal discharge and itching that started 3 days ago.

## 2015-01-28 NOTE — Discharge Instructions (Signed)

## 2015-01-29 LAB — RPR: RPR Ser Ql: NONREACTIVE

## 2015-01-29 LAB — HIV ANTIBODY (ROUTINE TESTING W REFLEX): HIV Screen 4th Generation wRfx: NONREACTIVE

## 2015-01-29 LAB — GC/CHLAMYDIA PROBE AMP (~~LOC~~) NOT AT ARMC
Chlamydia: NEGATIVE
Neisseria Gonorrhea: NEGATIVE

## 2015-02-05 ENCOUNTER — Emergency Department (HOSPITAL_COMMUNITY)
Admission: EM | Admit: 2015-02-05 | Discharge: 2015-02-05 | Discharge status: Against Medical Advice | Payer: Medicaid Other | Attending: Emergency Medicine | Admitting: Emergency Medicine

## 2015-02-05 ENCOUNTER — Encounter (HOSPITAL_COMMUNITY): Payer: Self-pay | Admitting: Emergency Medicine

## 2015-02-05 DIAGNOSIS — R0981 Nasal congestion: Secondary | ICD-10-CM | POA: Insufficient documentation

## 2015-02-05 DIAGNOSIS — Z720 Tobacco use: Secondary | ICD-10-CM | POA: Diagnosis not present

## 2015-02-05 DIAGNOSIS — E119 Type 2 diabetes mellitus without complications: Secondary | ICD-10-CM | POA: Insufficient documentation

## 2015-02-05 DIAGNOSIS — R05 Cough: Secondary | ICD-10-CM | POA: Diagnosis not present

## 2015-02-05 NOTE — ED Notes (Signed)
Pt states she's had a runny nose with occasional cough over the last two months. Denies N/V/D, fever chills. Lung sounds clear in all fields.

## 2015-02-05 NOTE — ED Notes (Signed)
Pt nowhere to be found, upon entering room.

## 2015-02-19 ENCOUNTER — Emergency Department (HOSPITAL_COMMUNITY)
Admission: EM | Admit: 2015-02-19 | Discharge: 2015-02-20 | Disposition: A | Discharge status: Other Acute Inpatient Hospital | Payer: Medicaid Other | Attending: Emergency Medicine | Admitting: Emergency Medicine

## 2015-02-19 ENCOUNTER — Encounter (HOSPITAL_COMMUNITY): Payer: Self-pay | Admitting: *Deleted

## 2015-02-19 DIAGNOSIS — R45851 Suicidal ideations: Secondary | ICD-10-CM

## 2015-02-19 DIAGNOSIS — Z880 Allergy status to penicillin: Secondary | ICD-10-CM | POA: Insufficient documentation

## 2015-02-19 DIAGNOSIS — F3130 Bipolar disorder, current episode depressed, mild or moderate severity, unspecified: Secondary | ICD-10-CM | POA: Insufficient documentation

## 2015-02-19 DIAGNOSIS — T383X2A Poisoning by insulin and oral hypoglycemic [antidiabetic] drugs, intentional self-harm, initial encounter: Secondary | ICD-10-CM | POA: Diagnosis not present

## 2015-02-19 DIAGNOSIS — Y9289 Other specified places as the place of occurrence of the external cause: Secondary | ICD-10-CM | POA: Diagnosis not present

## 2015-02-19 DIAGNOSIS — Y998 Other external cause status: Secondary | ICD-10-CM | POA: Insufficient documentation

## 2015-02-19 DIAGNOSIS — Y9389 Activity, other specified: Secondary | ICD-10-CM | POA: Insufficient documentation

## 2015-02-19 DIAGNOSIS — Z79899 Other long term (current) drug therapy: Secondary | ICD-10-CM | POA: Insufficient documentation

## 2015-02-19 DIAGNOSIS — IMO0001 Reserved for inherently not codable concepts without codable children: Secondary | ICD-10-CM

## 2015-02-19 DIAGNOSIS — E119 Type 2 diabetes mellitus without complications: Secondary | ICD-10-CM | POA: Diagnosis not present

## 2015-02-19 DIAGNOSIS — Z3202 Encounter for pregnancy test, result negative: Secondary | ICD-10-CM | POA: Insufficient documentation

## 2015-02-19 DIAGNOSIS — F17210 Nicotine dependence, cigarettes, uncomplicated: Secondary | ICD-10-CM | POA: Diagnosis not present

## 2015-02-19 DIAGNOSIS — F419 Anxiety disorder, unspecified: Secondary | ICD-10-CM | POA: Insufficient documentation

## 2015-02-19 DIAGNOSIS — T43592A Poisoning by other antipsychotics and neuroleptics, intentional self-harm, initial encounter: Secondary | ICD-10-CM | POA: Insufficient documentation

## 2015-02-19 DIAGNOSIS — F333 Major depressive disorder, recurrent, severe with psychotic symptoms: Secondary | ICD-10-CM

## 2015-02-19 LAB — COMPREHENSIVE METABOLIC PANEL
ALT: 23 U/L (ref 14–54)
ANION GAP: 6 (ref 5–15)
AST: 25 U/L (ref 15–41)
Albumin: 4.5 g/dL (ref 3.5–5.0)
Alkaline Phosphatase: 57 U/L (ref 38–126)
BILIRUBIN TOTAL: 0.5 mg/dL (ref 0.3–1.2)
BUN: 13 mg/dL (ref 6–20)
CHLORIDE: 107 mmol/L (ref 101–111)
CO2: 26 mmol/L (ref 22–32)
Calcium: 9.3 mg/dL (ref 8.9–10.3)
Creatinine, Ser: 0.69 mg/dL (ref 0.44–1.00)
Glucose, Bld: 99 mg/dL (ref 65–99)
POTASSIUM: 3.5 mmol/L (ref 3.5–5.1)
Sodium: 139 mmol/L (ref 135–145)
TOTAL PROTEIN: 7.9 g/dL (ref 6.5–8.1)

## 2015-02-19 LAB — URINALYSIS, ROUTINE W REFLEX MICROSCOPIC
BILIRUBIN URINE: NEGATIVE
GLUCOSE, UA: NEGATIVE mg/dL
Hgb urine dipstick: NEGATIVE
KETONES UR: NEGATIVE mg/dL
Leukocytes, UA: NEGATIVE
NITRITE: NEGATIVE
PH: 5.5 (ref 5.0–8.0)
PROTEIN: NEGATIVE mg/dL
Specific Gravity, Urine: 1.011 (ref 1.005–1.030)
Urobilinogen, UA: 0.2 mg/dL (ref 0.0–1.0)

## 2015-02-19 LAB — CBC WITH DIFFERENTIAL/PLATELET
BASOS PCT: 0 %
Basophils Absolute: 0.1 10*3/uL (ref 0.0–0.1)
EOS ABS: 0.7 10*3/uL (ref 0.0–0.7)
EOS PCT: 6 %
HCT: 39.2 % (ref 36.0–46.0)
Hemoglobin: 12.7 g/dL (ref 12.0–15.0)
LYMPHS ABS: 3.5 10*3/uL (ref 0.7–4.0)
Lymphocytes Relative: 31 %
MCH: 26.5 pg (ref 26.0–34.0)
MCHC: 32.4 g/dL (ref 30.0–36.0)
MCV: 81.8 fL (ref 78.0–100.0)
Monocytes Absolute: 0.7 10*3/uL (ref 0.1–1.0)
Monocytes Relative: 6 %
Neutro Abs: 6.4 10*3/uL (ref 1.7–7.7)
Neutrophils Relative %: 57 %
PLATELETS: 206 10*3/uL (ref 150–400)
RBC: 4.79 MIL/uL (ref 3.87–5.11)
RDW: 14.4 % (ref 11.5–15.5)
WBC: 11.4 10*3/uL — AB (ref 4.0–10.5)

## 2015-02-19 LAB — LIPASE, BLOOD: LIPASE: 67 U/L — AB (ref 11–51)

## 2015-02-19 LAB — RAPID URINE DRUG SCREEN, HOSP PERFORMED
Amphetamines: NOT DETECTED
BARBITURATES: NOT DETECTED
Benzodiazepines: NOT DETECTED
COCAINE: NOT DETECTED
Opiates: NOT DETECTED
Tetrahydrocannabinol: NOT DETECTED

## 2015-02-19 LAB — PREGNANCY, URINE: PREG TEST UR: NEGATIVE

## 2015-02-19 LAB — ETHANOL

## 2015-02-19 LAB — SALICYLATE LEVEL

## 2015-02-19 LAB — CBG MONITORING, ED: Glucose-Capillary: 79 mg/dL (ref 65–99)

## 2015-02-19 MED ORDER — ALUM & MAG HYDROXIDE-SIMETH 200-200-20 MG/5ML PO SUSP: 30.0000 mL | ORAL | Status: DC | PRN

## 2015-02-19 MED ORDER — ACETAMINOPHEN 325 MG PO TABS: 650.0000 mg | ORAL_TABLET | ORAL | Status: DC | PRN
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
## 2015-02-19 NOTE — ED Notes (Addendum)
Patient from home via EMS for SI. Per EMS patient took two Metformin, seven Buspirone/Buspar. Denies N/V, ambulatory upon arrival.   Last VS 146/94, 100hr, 97%ra, cbg98, 16resp

## 2015-02-19 NOTE — ED Notes (Signed)
Bed: SM27 Expected date:  Expected time:  Means of arrival:  Comments: Ems-si attempt

## 2015-02-19 NOTE — ED Notes (Signed)
Pt has in belonging bag:  Black and brown purse, black jeans, tan bra, black shirt, and black and grey zip up hoodie.

## 2015-02-20 ENCOUNTER — Encounter (HOSPITAL_COMMUNITY): Payer: Self-pay | Admitting: *Deleted

## 2015-02-20 ENCOUNTER — Observation Stay (HOSPITAL_COMMUNITY)
Admission: AD | Admit: 2015-02-20 | Discharge: 2015-02-21 | Disposition: A | Discharge status: Home / Self Care | Payer: Medicaid Other | Source: Intra-hospital | Attending: Psychiatry | Admitting: Psychiatry

## 2015-02-20 DIAGNOSIS — R45851 Suicidal ideations: Secondary | ICD-10-CM

## 2015-02-20 DIAGNOSIS — E119 Type 2 diabetes mellitus without complications: Secondary | ICD-10-CM | POA: Insufficient documentation

## 2015-02-20 DIAGNOSIS — F329 Major depressive disorder, single episode, unspecified: Secondary | ICD-10-CM | POA: Insufficient documentation

## 2015-02-20 DIAGNOSIS — T50902A Poisoning by unspecified drugs, medicaments and biological substances, intentional self-harm, initial encounter: Secondary | ICD-10-CM | POA: Diagnosis present

## 2015-02-20 DIAGNOSIS — F333 Major depressive disorder, recurrent, severe with psychotic symptoms: Secondary | ICD-10-CM | POA: Diagnosis not present

## 2015-02-20 DIAGNOSIS — Y9289 Other specified places as the place of occurrence of the external cause: Secondary | ICD-10-CM | POA: Diagnosis not present

## 2015-02-20 DIAGNOSIS — F411 Generalized anxiety disorder: Secondary | ICD-10-CM | POA: Diagnosis not present

## 2015-02-20 DIAGNOSIS — T383X2A Poisoning by insulin and oral hypoglycemic [antidiabetic] drugs, intentional self-harm, initial encounter: Secondary | ICD-10-CM | POA: Insufficient documentation

## 2015-02-20 DIAGNOSIS — R4189 Other symptoms and signs involving cognitive functions and awareness: Secondary | ICD-10-CM | POA: Diagnosis not present

## 2015-02-20 DIAGNOSIS — T43592A Poisoning by other antipsychotics and neuroleptics, intentional self-harm, initial encounter: Secondary | ICD-10-CM | POA: Insufficient documentation

## 2015-02-20 DIAGNOSIS — F17210 Nicotine dependence, cigarettes, uncomplicated: Secondary | ICD-10-CM | POA: Insufficient documentation

## 2015-02-20 DIAGNOSIS — T50901A Poisoning by unspecified drugs, medicaments and biological substances, accidental (unintentional), initial encounter: Secondary | ICD-10-CM | POA: Diagnosis present

## 2015-02-20 MED ORDER — HYDROXYZINE HCL 25 MG PO TABS: 25.0000 mg | ORAL_TABLET | Freq: Four times a day (QID) | ORAL | Status: DC | PRN

## 2015-02-20 MED ORDER — DULOXETINE HCL 30 MG PO CPEP
90.0000 mg | ORAL_CAPSULE | Freq: Every day | ORAL | Status: DC
  Administered 2015-02-20 – 2015-02-21 (×2): 90 mg via ORAL
  Filled 2015-02-20 (×2): qty 3

## 2015-02-20 MED ORDER — NICOTINE 14 MG/24HR TD PT24
14.0000 mg | MEDICATED_PATCH | Freq: Every day | TRANSDERMAL | Status: DC
  Administered 2015-02-20 – 2015-02-21 (×2): 14 mg via TRANSDERMAL
  Filled 2015-02-20 (×2): qty 1

## 2015-02-20 MED ORDER — BUSPIRONE HCL 15 MG PO TABS
7.5000 mg | ORAL_TABLET | Freq: Three times a day (TID) | ORAL | Status: DC
  Administered 2015-02-20 – 2015-02-21 (×2): 7.5 mg via ORAL
  Filled 2015-02-20 (×2): qty 1

## 2015-02-20 MED ORDER — METFORMIN HCL 500 MG PO TABS
500.0000 mg | ORAL_TABLET | Freq: Two times a day (BID) | ORAL | Status: DC
  Administered 2015-02-20 – 2015-02-21 (×2): 500 mg via ORAL
  Filled 2015-02-20 (×2): qty 1

## 2015-02-20 MED ORDER — MAGNESIUM HYDROXIDE 400 MG/5ML PO SUSP: 30.0000 mL | Freq: Every day | ORAL | Status: DC | PRN

## 2015-02-20 MED ORDER — TRAZODONE HCL 50 MG PO TABS
50.0000 mg | ORAL_TABLET | Freq: Every evening | ORAL | Status: DC | PRN
  Administered 2015-02-21: 50 mg via ORAL
  Filled 2015-02-20: qty 1

## 2015-02-20 NOTE — ED Notes (Signed)
Bed: YI50 Expected date:  Expected time:  Means of arrival:  Comments: Room 9 graves
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
## 2015-02-20 NOTE — BH Assessment (Signed)
[place] Assessment Progress Note  Per [name], MD, this pt would benefit from admission to the [facility] Observation Unit at this time.  Letitia Libra, RN, [facility] has assigned pt to Obs 2.  Pt has signed Voluntary Admission and Consent for Treatment.  She does not recall the name of her current ACT Team provider; Consent to Release Information was therefore foregone.  Signed form has been faxed to [facility].  Pt's nurse has been notified, and agrees to send original paperwork along with pt via [facility], and to call report to [phone] or [phone].  Jalene Mullet, [place] Triage Specialist [phone]
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
## 2015-02-20 NOTE — Progress Notes (Signed)
Patient ID: [name], female   DOB: November 22, 1990, 24 y.o.   MRN: [id number] Requested a nicotine patch late this pm for cigarette use of 1/2 pack every other day. Put on a 15 mg patch at her request complaining of anxiety as a result of not smoking.

## 2015-02-20 NOTE — BHH Counselor (Signed)
Disposition: Per [name], PA, Pt meets inpt tx criteria. Recommendation that pt be referred to more appropriate outside facilities due to cognitive deficits. TTS to seek placement.    Ramond Dial, [facility] Triage Specialist

## 2015-02-20 NOTE — BHH Counselor (Signed)
This Probation officer has made several attempts to contact pt ACTT provider. This Probation officer had no luck in making contact with the therapist. This Probation officer also spoke with the pt to see if she could provide the name of her ACTT provider. Pt was not able to provide this Probation officer with a name of her ACTT provider. Pt shared that she has a good support system and she has a stable place to go back to once she is discharged. Pt also reports that she would like to move to a group home setting because she gets lonely living by herself. This Probation officer informed pt that her ACTT provider can assist her with moving to another location. This Probation officer spoke with pt to address any SI/HI. Pt reports that she is not experiencing either at this time. Pt was calm and cooperative while being assessed. This Probation officer explained to pt that she would be seen in the AM by an extender to determine her disposition. This Probation officer will continue to monitor pt throughout the shift and provide support and encouragement as needed.  Redmond Pulling, MA OBS Unit

## 2015-02-20 NOTE — BHH Suicide Risk Assessment (Cosign Needed)
Suicide Risk Assessment  Admission Assessment   [facility] Admission Suicide Risk Assessment   Nursing information obtained from:    Demographic factors:    Current Mental Status:    Loss Factors:    Historical Factors:    Risk Reduction Factors:    Total Time spent with patient: 45 minutes Principal Problem: Severe recurrent major depressive disorder with psychotic features [facility]) Diagnosis:   Patient Active Problem List   Diagnosis Date Noted  . Severe recurrent major depressive disorder with psychotic features ([place]) [F33.3]     Priority: High  . Suicidal ideation [R45.851]   . Recurrent major depression-severe ([place]) [F33.2] 12/22/2014  . Drug overdose [T50.901A]   . Cognitive deficits [R41.89] 10/12/2012  . Generalized anxiety disorder [F41.1] 06/28/2012     Continued Clinical Symptoms:    The "Alcohol Use Disorders Identification Test", Guidelines for Use in Primary Care, Second Edition.  World Pharmacologist [employer]). Score between 0-7:  no or low risk or alcohol related problems. Score between 8-15:  moderate risk of alcohol related problems. Score between 16-19:  high risk of alcohol related problems. Score 20 or above:  warrants further diagnostic evaluation for alcohol dependence and treatment.   CLINICAL FACTORS:   Depression:   Severe   Musculoskeletal: Strength & Muscle Tone: within normal limits Gait & Station: normal Patient leans: Right  Psychiatric Specialty Exam:     Blood pressure 104/53, pulse 102, temperature 98.9 F (37.2 C), temperature source Oral, resp. rate 22, SpO2 99 %, not currently breastfeeding.There is no weight on file to calculate BMI.  General Appearance: Casual  Eye Contact::  Good  Speech:  Clear and Coherent  Volume:  Normal  Mood:  Anxious  Affect:  Appropriate  Thought Process:  Circumstantial and Coherent  Orientation:  Full (Time, Place, and Person)  Thought Content:  Rumination  Suicidal Thoughts:  No  Homicidal Thoughts:   No  Memory:  Immediate;   Fair Recent;   Fair Remote;   Fair  Judgement:  Impaired  Insight:  Lacking  Psychomotor Activity:  Normal  Concentration:  Fair  Recall:  [employer] of Knowledge:Fair  Language: Fair  Akathisia:  Negative  Handed:  Right  AIMS (if indicated):     Assets:  Communication Skills  Sleep:     Cognition: WNL  ADL's:  Intact     COGNITIVE FEATURES THAT CONTRIBUTE TO RISK:  Polarized thinking    SUICIDE RISK:  Moderate:  Frequent suicidal ideation with limited intensity, and duration, some specificity in terms of plans, no associated intent, good self-control, limited dysphoria/symptomatology, some risk factors present, and identifiable protective factors, including available and accessible social support.  PLAN OF CARE: Treatment Plan/Recommendations:  Admit for crisis management and mood stabilization. Medication management to re-stabilize current mood symptoms Group counseling sessions for coping skills Medical consults as needed Review and reinstate any pertinent home medications for other health problems   Medical Decision Making:  Review of Psycho-Social Stressors (1), Discuss test with performing physician (1), Decision to obtain old records (1), Review and summation of old records (2) and Review of Medication Regimen & Side Effects (2)  I certify that inpatient services furnished can reasonably be expected to improve the patient's condition.   Freda Munro May Thalya Fouche AGNP-BC 02/20/2015, 2:07 PM

## 2015-02-20 NOTE — ED Notes (Signed)
Patient laughing inappropriately out loud.

## 2015-02-20 NOTE — BH Assessment (Signed)
[place] Assessment Progress Note  Patient was assessed this date by [name] [place], [place] DNP upon admission. Patient has been receiving medications from [facility] but has recently been on an ACTT team which this writer attempted to contact but was unable to reach her therapist at [phone]. This Probation officer spoke at length with patient who is living independently with her mother being her payee. Patient does admit that she might be interested in exploring some group home options which her ACTT team can assist her with upon discharge.

## 2015-02-21 DIAGNOSIS — F333 Major depressive disorder, recurrent, severe with psychotic symptoms: Secondary | ICD-10-CM | POA: Diagnosis not present

## 2015-02-21 MED ORDER — METFORMIN HCL 500 MG PO TABS: 500.0000 mg | ORAL_TABLET | Freq: Two times a day (BID) | ORAL | Status: DC

## 2015-02-21 MED ORDER — BUSPIRONE HCL 7.5 MG PO TABS: 7.5000 mg | ORAL_TABLET | Freq: Three times a day (TID) | ORAL | Status: DC

## 2015-02-21 MED ORDER — PALIPERIDONE PALMITATE 117 MG/0.75ML IM SUSP: 117.0000 mg | INTRAMUSCULAR | Status: DC

## 2015-02-21 MED ORDER — DULOXETINE HCL 30 MG PO CPEP: 90.0000 mg | ORAL_CAPSULE | Freq: Every day | ORAL | Status: DC

## 2015-02-21 NOTE — Progress Notes (Signed)
Nursing Shift Assessment Note:  Patient sleeping until awakened by Nurse for med pass. Patient compliant with ordered meds, cooperative, denies any SI/HI/AVH, or any significant anxiety or depression. Patient ate 100% of her breakfast. Nurse engaging patient in conversation while giving meds to establish rapport and help patient feel comfortable discussing her current emotions and thought content. Q15 minute checks continuous. Patient remains safe on Unit.

## 2015-02-21 NOTE — BHH Counselor (Signed)
[place] Assessment Progress Note  Counselor spoke with pt to discuss d/c planning. Counselor discovered that the telephone number rec'd for [name] [phone]) was rec'd incorrectly and the actual # is [phone]. Counselor called this # and spoke to [employer]. Joellyn Rued confirmed that pt is a client of their ACTT team svcs. She indicated that she would be able to pick client up from [facility] after 10am and have a session with her. Pt was agreeable to this plan. Counselor advised pt to let Joellyn Rued know that she was interested in moving to a group home setting for socialization purposes. Pt said that she would do so. Pt continues to deny SI/HI/AVH.  Kenna Gilbert. [name], [place], [place], LPCA Counselor

## 2015-02-21 NOTE — Discharge Summary (Signed)
Physician Discharge Summary Note  Patient:  [name] is an 24 y.o., female MRN:  [id number] DOB:  16-Jul-1990 Patient phone:  [phone] (home)  Patient address:   [place] [address],  Total Time spent with patient: 30 minutes  Date of Admission:  02/20/2015 Date of Discharge: 02/21/2015  Reason for Admission:  Depression,   Principal Problem: Severe recurrent major depressive disorder with psychotic features [facility]) Discharge Diagnoses: Patient Active Problem List   Diagnosis Date Noted  . Severe recurrent major depressive disorder with psychotic features ([place]) [F33.3]     Priority: High  . MDD (major depressive disorder) ([place]) [F32.9] 02/20/2015  . Suicidal ideation [R45.851]   . Recurrent major depression-severe ([place]) [F33.2] 12/22/2014  . Drug overdose [T50.901A]   . Cognitive deficits [R41.89] 10/12/2012  . Generalized anxiety disorder [F41.1] 06/28/2012    Musculoskeletal: Strength & Muscle Tone: within normal limits Gait & Station: normal Patient leans: N/A  Psychiatric Specialty Exam:   Physical Exam  Vitals reviewed. Psychiatric: She is not withdrawn. Thought content is not paranoid and not delusional. She expresses no homicidal and no suicidal ideation. She expresses no suicidal plans and no homicidal plans.    Review of Systems  Psychiatric/Behavioral: Negative for depression, suicidal ideas, hallucinations and substance abuse. The patient is not nervous/anxious.   All other systems reviewed and are negative.   Blood pressure 121/79, pulse 107, temperature 98.3 F (36.8 C), temperature source Oral, resp. rate 18, height '5\' 7"'  (1.702 m), weight 117.935 kg (260 lb), not currently breastfeeding.Body mass index is 40.71 kg/(m^2).   General Appearance: Casual  Eye Contact:: Good  Speech: Clear and Coherent  Volume: Normal  Mood: Anxious  Affect: Appropriate  Thought Process: Circumstantial and Coherent   Orientation: Full (Time, Place, and Person)  Thought Content: Rumination  Suicidal Thoughts: No  Homicidal Thoughts: No  Memory: Immediate; Fair Recent; Fair Remote; Fair  Judgement: Impaired  Insight: Lacking  Psychomotor Activity: Normal  Concentration: Fair  Recall: [employer] of Knowledge:Fair  Language: Fair  Akathisia: Negative  Handed: Right  AIMS (if indicated):   Assets: Communication Skills  Sleep:   Cognition: WNL  ADL's: Intact           Have you used any form of tobacco in the last 30 days? (Cigarettes, Smokeless Tobacco, Cigars, and/or Pipes): No  Has this patient used any form of tobacco in the last 30 days? (Cigarettes, Smokeless Tobacco, Cigars, and/or Pipes) N/A  Past Medical History:  Past Medical History  Diagnosis Date  . Anxiety   . Mental disorder   . Depression   . Cognitive deficits   . Bipolar 1 disorder ([place])   . 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 Cesarean section    . Tonsillectomy N/A 06/03/2012    Procedure: TONSILLECTOMY;  Surgeon: Stacy Belfast, [name];  Location: [facility];  Service: ENT;  Laterality: N/A;  . Mass excision N/A 06/03/2012    Procedure: EXCISION MASS;  Surgeon: Stacy Belfast, [name];  Location: [facility];  Service: ENT;  Laterality: N/A;  Excision uvula mass  . Cesarean section N/A 04/25/2013    Procedure: REPEAT CESAREAN SECTION;  Surgeon: Stacy Bellman, [name];  Location: [facility];  Service: Obstetrics;  Laterality: N/A;  . Tonsillectomy     Family History:  Family History  Problem Relation Age of Onset  . [name]   .  [name]    Social History:  History  Alcohol Use No     History  Drug Use No    Comment: Patient denies    Social History   Social History  . Marital Status: Single    Spouse Name: N/A  . Number of Children: N/A  .  Years of Education: N/A   Social History Main Topics  . Smoking status: Current Some Day Smoker -- 0.50 packs/day for 7 years    Types: Cigarettes  . Smokeless tobacco: Never Used  . Alcohol Use: No  . Drug Use: No     Comment: Patient denies  . Sexual Activity: Yes    Birth Control/ Protection: Implant   Other Topics Concern  . None   Social History Narrative   Risk to Self: Is patient at risk for suicide?: No Risk to Others:   Prior Inpatient Therapy:   Prior Outpatient Therapy:    Level of Care:  OP  Hospital Course:  [name] was admitted for Severe recurrent major depressive disorder with psychotic features ([place]) and crisis management.  She was treated discharged with the medications listed below under Medication List.  Medical problems were identified and treated as needed.  Home medications were restarted as appropriate.  Improvement was monitored by observation and [name] daily report of symptom reduction.  Emotional and mental status was monitored by daily self-inventory reports completed by [name] and clinical staff.         [name] was evaluated by the treatment team for stability and plans for continued recovery upon discharge.  [name] motivation was an integral factor for scheduling further treatment.  Employment, transportation, bed availability, health status, family support, and any pending legal issues were also considered during her hospital stay.  She was offered further treatment options upon discharge including but not limited to Residential, Intensive Outpatient, and Outpatient treatment.  [name] will follow up with the services as listed below under Follow Up Information.     Upon completion of this admission the patient was both mentally and medically stable for discharge denying suicidal/homicidal ideation, auditory/visual/tactile hallucinations, delusional thoughts and paranoia.      Consults:   psychiatry  Significant Diagnostic Studies:  labs: per ED  Discharge Vitals:   Blood pressure 121/79, pulse 107, temperature 98.3 F (36.8 C), temperature source Oral, resp. rate 18, height '5\' 7"'  (1.702 m), weight 117.935 kg (260 lb), not currently breastfeeding. Body mass index is 40.71 kg/(m^2). Lab Results:   Results for orders placed or performed during the hospital encounter of 02/19/15 (from the past 72 hour(s))  POC CBG, ED     Status: None   Collection Time: 02/19/15  8:07 PM  Result Value Ref Range   Glucose-Capillary 79 65 - 99 mg/dL  Comprehensive metabolic panel     Status: None   Collection Time: 02/19/15  9:08 PM  Result Value Ref Range   Sodium 139 135 - 145 mmol/L   Potassium 3.5 3.5 - 5.1 mmol/L   Chloride 107 101 - 111 mmol/L   CO2 26 22 - 32 mmol/L   Glucose, Bld 99 65 - 99 mg/dL   BUN 13 6 - 20 mg/dL   Creatinine, Ser 0.69 0.44 - 1.00 mg/dL   Calcium 9.3 8.9 - 10.3 mg/dL   Total Protein 7.9 6.5 - 8.1 g/dL   Albumin 4.5 3.5 - 5.0 g/dL   AST 25 15 - 41 U/L  ALT 23 14 - 54 U/L   Alkaline Phosphatase 57 38 - 126 U/L   Total Bilirubin 0.5 0.3 - 1.2 mg/dL   GFR calc non Af Amer >60 >60 mL/min   GFR calc Af Amer >60 >60 mL/min    Comment: (NOTE) The eGFR has been calculated using the CKD EPI equation. This calculation has not been validated in all clinical situations. eGFR's persistently <60 mL/min signify possible Chronic Kidney Disease.    Anion gap 6 5 - 15  Ethanol     Status: None   Collection Time: 02/19/15  9:08 PM  Result Value Ref Range   Alcohol, Ethyl (B) <5 <5 mg/dL    Comment:        LOWEST DETECTABLE LIMIT FOR SERUM ALCOHOL IS 5 mg/dL FOR MEDICAL PURPOSES ONLY   Lipase, blood     Status: Abnormal   Collection Time: 02/19/15  9:08 PM  Result Value Ref Range   Lipase 67 (H) 11 - 51 U/L    Comment: Please note change in reference range.  Salicylate level     Status: None   Collection Time: 02/19/15  9:08 PM  Result Value Ref Range    Salicylate Lvl <3.5 2.8 - 30.0 mg/dL  CBC with Differential     Status: Abnormal   Collection Time: 02/19/15  9:08 PM  Result Value Ref Range   WBC 11.4 (H) 4.0 - 10.5 K/uL   RBC 4.79 3.87 - 5.11 MIL/uL   Hemoglobin 12.7 12.0 - 15.0 g/dL   HCT 39.2 36.0 - 46.0 %   MCV 81.8 78.0 - 100.0 fL   MCH 26.5 26.0 - 34.0 pg   MCHC 32.4 30.0 - 36.0 g/dL   RDW 14.4 11.5 - 15.5 %   Platelets 206 150 - 400 K/uL   Neutrophils Relative % 57 %   Neutro Abs 6.4 1.7 - 7.7 K/uL   Lymphocytes Relative 31 %   Lymphs Abs 3.5 0.7 - 4.0 K/uL   Monocytes Relative 6 %   Monocytes Absolute 0.7 0.1 - 1.0 K/uL   Eosinophils Relative 6 %   Eosinophils Absolute 0.7 0.0 - 0.7 K/uL   Basophils Relative 0 %   Basophils Absolute 0.1 0.0 - 0.1 K/uL  Pregnancy, urine     Status: None   Collection Time: 02/19/15  9:10 PM  Result Value Ref Range   Preg Test, Ur NEGATIVE NEGATIVE    Comment:        THE SENSITIVITY OF THIS METHODOLOGY IS >20 mIU/mL.   Urinalysis, Routine w reflex microscopic     Status: None   Collection Time: 02/19/15  9:10 PM  Result Value Ref Range   Color, Urine YELLOW YELLOW   APPearance CLEAR CLEAR   Specific Gravity, Urine 1.011 1.005 - 1.030   pH 5.5 5.0 - 8.0   Glucose, UA NEGATIVE NEGATIVE mg/dL   Hgb urine dipstick NEGATIVE NEGATIVE   Bilirubin Urine NEGATIVE NEGATIVE   Ketones, ur NEGATIVE NEGATIVE mg/dL   Protein, ur NEGATIVE NEGATIVE mg/dL   Urobilinogen, UA 0.2 0.0 - 1.0 mg/dL   Nitrite NEGATIVE NEGATIVE   Leukocytes, UA NEGATIVE NEGATIVE    Comment: MICROSCOPIC NOT DONE ON URINES WITH NEGATIVE PROTEIN, BLOOD, LEUKOCYTES, NITRITE, OR GLUCOSE <1000 mg/dL.  Urine rapid drug screen (hosp performed)     Status: None   Collection Time: 02/19/15  9:10 PM  Result Value Ref Range   Opiates NONE DETECTED NONE DETECTED   Cocaine NONE DETECTED NONE DETECTED  Benzodiazepines NONE DETECTED NONE DETECTED   Amphetamines NONE DETECTED NONE DETECTED   Tetrahydrocannabinol NONE DETECTED  NONE DETECTED   Barbiturates NONE DETECTED NONE DETECTED    Comment:        DRUG SCREEN FOR MEDICAL PURPOSES ONLY.  IF CONFIRMATION IS NEEDED FOR ANY PURPOSE, NOTIFY LAB WITHIN 5 DAYS.        LOWEST DETECTABLE LIMITS FOR URINE DRUG SCREEN Drug Class       Cutoff (ng/mL) Amphetamine      1000 Barbiturate      200 Benzodiazepine   182 Tricyclics       993 Opiates          300 Cocaine          300 THC              50     Physical Findings: AIMS: Facial and Oral Movements Muscles of Facial Expression: None, normal Lips and Perioral Area: None, normal Jaw: None, normal Tongue: None, normal,Extremity Movements Upper (arms, wrists, hands, fingers): None, normal Lower (legs, knees, ankles, toes): None, normal, Trunk Movements Neck, shoulders, hips: None, normal, Overall Severity Severity of abnormal movements (highest score from questions above): None, normal Incapacitation due to abnormal movements: None, normal Patient's awareness of abnormal movements (rate only patient's report): No Awareness, Dental Status Current problems with teeth and/or dentures?: No Does patient usually wear dentures?: No  CIWA:    COWS:      See Psychiatric Specialty Exam and Suicide Risk Assessment completed by Attending Physician prior to discharge.  Discharge destination:  Home  Is patient on multiple antipsychotic therapies at discharge:  No   Has Patient had three or more failed trials of antipsychotic monotherapy by history:  No    Recommended Plan for Multiple Antipsychotic Therapies: NA  Discharge Instructions    Diet - low sodium heart healthy    Complete by:  As directed      Discharge instructions    Complete by:  As directed   Patient is being followed by ACT Team     Increase activity slowly    Complete by:  As directed             Medication List    TAKE these medications      Indication   busPIRone 7.5 MG tablet  Commonly known as:  BUSPAR  Take 1 tablet (7.5 mg  total) by mouth 3 (three) times daily.   Indication:  Anxiety Disorder     DULoxetine 30 MG capsule  Commonly known as:  CYMBALTA  Take 3 capsules (90 mg total) by mouth daily.   Indication:  Generalized Anxiety Disorder, Major Depressive Disorder     metFORMIN 500 MG tablet  Commonly known as:  GLUCOPHAGE  Take 1 tablet (500 mg total) by mouth 2 (two) times daily. For Stacy management   Indication:  Type 2 Stacy     Paliperidone Palmitate 117 MG/0.75ML Susp  Commonly known as:  INVEGA SUSTENNA  Inject 117 mg into the muscle every 28 (twenty-eight) days. Resume as per outpatient prescriber   Indication:  Mood control         Follow-up recommendations:  Activity:  as tol Diet:  as tol  Comments:  1.  Take all your medications as prescribed.              2.  Report any adverse side effects to outpatient provider.  3.  Patient instructed to not use alcohol or illegal drugs while on prescription medicines.            4.  In the event of worsening symptoms, instructed patient to call 911, the crisis hotline or go to nearest emergency room for evaluation of symptoms.  Total Discharge Time:  30 min  Signed: Freda Munro May Coden Franchi AGNP-BC 02/21/2015, 10:48 AM

## 2015-02-21 NOTE — Progress Notes (Signed)
Nursing Discharge Note:  Patient's belongings returned to her from locker and signed for; Discharge Instructions reviewed with patient with patient signing chart copy acknowledging receipt of all information within, and patient given an exact copy of the Discharge Summary. Patient is upbeat with good eye contact, continues to deny andy SI/HI/AVH.  Patient discharged from the Unit and escorted to the Lobby for transport by [facility] to meet with patient's ACT team.

## 2015-02-21 NOTE — Progress Notes (Signed)
Pt has been sleeping most of the evening.  She awoke briefly for a snack and was assessed by Probation officer and counselor at that time.  Pt pleasant and cooperative.  She denies SI/HI/AVH at this time. Pt voices no complaints at this time. Safety maintained with q15 minute checks.

## 2015-02-21 NOTE — BHH Counselor (Signed)
[place] Assessment Progress Note  Pt has been d/c to [employer], who will take her her ACTT team offices. Transport all set up by [name].   [name]. [name], [place], [place], LPCA Counselor

## 2015-03-11 ENCOUNTER — Encounter (HOSPITAL_COMMUNITY): Payer: Self-pay | Admitting: Emergency Medicine

## 2015-03-11 ENCOUNTER — Emergency Department (HOSPITAL_COMMUNITY)
Admission: EM | Admit: 2015-03-11 | Discharge: 2015-03-11 | Discharge status: Against Medical Advice | Payer: Medicaid Other | Attending: Emergency Medicine | Admitting: Emergency Medicine

## 2015-03-11 DIAGNOSIS — R51 Headache: Secondary | ICD-10-CM | POA: Diagnosis not present

## 2015-03-11 DIAGNOSIS — E119 Type 2 diabetes mellitus without complications: Secondary | ICD-10-CM | POA: Insufficient documentation

## 2015-03-11 DIAGNOSIS — F17210 Nicotine dependence, cigarettes, uncomplicated: Secondary | ICD-10-CM | POA: Insufficient documentation

## 2015-03-11 DIAGNOSIS — R0981 Nasal congestion: Secondary | ICD-10-CM | POA: Diagnosis not present

## 2015-03-11 NOTE — ED Notes (Signed)
Per ems comes form home c/o nasal congestion, sinus facial pain, and headache x week or so. Pt hasnt taken any medications or seen her PCP for her symptoms.

## 2015-03-11 NOTE — ED Notes (Signed)
Patient called x 3 no answer 

## 2015-05-08 ENCOUNTER — Emergency Department (HOSPITAL_COMMUNITY)
Admission: EM | Admit: 2015-05-08 | Discharge: 2015-05-09 | Disposition: A | Discharge status: Other Acute Inpatient Hospital | Payer: Medicaid Other | Attending: Emergency Medicine | Admitting: Emergency Medicine

## 2015-05-08 ENCOUNTER — Encounter (HOSPITAL_COMMUNITY): Payer: Self-pay | Admitting: Family Medicine

## 2015-05-08 DIAGNOSIS — F339 Major depressive disorder, recurrent, unspecified: Secondary | ICD-10-CM | POA: Insufficient documentation

## 2015-05-08 DIAGNOSIS — Z3202 Encounter for pregnancy test, result negative: Secondary | ICD-10-CM | POA: Insufficient documentation

## 2015-05-08 DIAGNOSIS — R45851 Suicidal ideations: Secondary | ICD-10-CM

## 2015-05-08 DIAGNOSIS — F17210 Nicotine dependence, cigarettes, uncomplicated: Secondary | ICD-10-CM | POA: Insufficient documentation

## 2015-05-08 DIAGNOSIS — Z880 Allergy status to penicillin: Secondary | ICD-10-CM | POA: Insufficient documentation

## 2015-05-08 DIAGNOSIS — Z79899 Other long term (current) drug therapy: Secondary | ICD-10-CM | POA: Insufficient documentation

## 2015-05-08 DIAGNOSIS — F419 Anxiety disorder, unspecified: Secondary | ICD-10-CM | POA: Insufficient documentation

## 2015-05-08 DIAGNOSIS — Z7984 Long term (current) use of oral hypoglycemic drugs: Secondary | ICD-10-CM | POA: Insufficient documentation

## 2015-05-08 DIAGNOSIS — E119 Type 2 diabetes mellitus without complications: Secondary | ICD-10-CM | POA: Insufficient documentation

## 2015-05-08 LAB — COMPREHENSIVE METABOLIC PANEL
ALT: 28 U/L (ref 14–54)
AST: 36 U/L (ref 15–41)
Albumin: 4.3 g/dL (ref 3.5–5.0)
Alkaline Phosphatase: 65 U/L (ref 38–126)
Anion gap: 9 (ref 5–15)
BILIRUBIN TOTAL: 0.9 mg/dL (ref 0.3–1.2)
BUN: 14 mg/dL (ref 6–20)
CO2: 27 mmol/L (ref 22–32)
CREATININE: 0.86 mg/dL (ref 0.44–1.00)
Calcium: 9.4 mg/dL (ref 8.9–10.3)
Chloride: 105 mmol/L (ref 101–111)
GFR calc Af Amer: >60 mL/min (ref >60)
Glucose, Bld: 180 mg/dL — ABNORMAL HIGH (ref 65–99)
Potassium: 4 mmol/L (ref 3.5–5.1)
Sodium: 141 mmol/L (ref 135–145)
TOTAL PROTEIN: 7.9 g/dL (ref 6.5–8.1)

## 2015-05-08 LAB — RAPID URINE DRUG SCREEN, HOSP PERFORMED
Amphetamines: NOT DETECTED
Barbiturates: NOT DETECTED
Benzodiazepines: NOT DETECTED
Cocaine: NOT DETECTED
Opiates: NOT DETECTED
Tetrahydrocannabinol: NOT DETECTED

## 2015-05-08 LAB — CBC
HCT: 38.7 % (ref 36.0–46.0)
Hemoglobin: 12.4 g/dL (ref 12.0–15.0)
MCH: 26.8 pg (ref 26.0–34.0)
MCHC: 32 g/dL (ref 30.0–36.0)
MCV: 83.8 fL (ref 78.0–100.0)
PLATELETS: 227 10*3/uL (ref 150–400)
RBC: 4.62 MIL/uL (ref 3.87–5.11)
RDW: 14.5 % (ref 11.5–15.5)
WBC: 9.2 10*3/uL (ref 4.0–10.5)

## 2015-05-08 LAB — ACETAMINOPHEN LEVEL: Acetaminophen (Tylenol), Serum: <10 ug/mL — ABNORMAL LOW (ref 10–30)

## 2015-05-08 LAB — SALICYLATE LEVEL: Salicylate Lvl: <4 mg/dL (ref 2.8–30.0)

## 2015-05-08 LAB — ETHANOL

## 2015-05-08 LAB — POC URINE PREG, ED: PREG TEST UR: NEGATIVE

## 2015-05-08 MED ORDER — METFORMIN HCL 500 MG PO TABS
500.0000 mg | ORAL_TABLET | Freq: Two times a day (BID) | ORAL | Status: DC
  Filled 2015-05-08 (×3): qty 1

## 2015-05-08 MED ORDER — FLUTICASONE PROPIONATE 50 MCG/ACT NA SUSP
1.0000 | Freq: Every day | NASAL | Status: DC | PRN
  Filled 2015-05-08: qty 16

## 2015-05-08 MED ORDER — IBUPROFEN 200 MG PO TABS: 600.0000 mg | ORAL_TABLET | Freq: Three times a day (TID) | ORAL | Status: DC | PRN

## 2015-05-08 MED ORDER — ONDANSETRON HCL 4 MG PO TABS: 4.0000 mg | ORAL_TABLET | Freq: Three times a day (TID) | ORAL | Status: DC | PRN

## 2015-05-08 MED ORDER — BUSPIRONE HCL 10 MG PO TABS
10.0000 mg | ORAL_TABLET | Freq: Three times a day (TID) | ORAL | Status: DC
  Administered 2015-05-09: 10 mg via ORAL
  Filled 2015-05-08: qty 1

## 2015-05-08 MED ORDER — ACETAMINOPHEN 325 MG PO TABS: 650.0000 mg | ORAL_TABLET | ORAL | Status: DC | PRN

## 2015-05-08 MED ORDER — LORATADINE 10 MG PO TABS
10.0000 mg | ORAL_TABLET | Freq: Every day | ORAL | Status: DC
  Filled 2015-05-08: qty 1

## 2015-05-08 MED ORDER — LORAZEPAM 1 MG PO TABS: 1.0000 mg | ORAL_TABLET | Freq: Three times a day (TID) | ORAL | Status: DC | PRN

## 2015-05-08 MED ORDER — DULOXETINE HCL 60 MG PO CPEP
60.0000 mg | ORAL_CAPSULE | Freq: Every day | ORAL | Status: DC
  Filled 2015-05-08: qty 1

## 2015-05-08 NOTE — ED Notes (Signed)
Pt stated that she could not urinate. 

## 2015-05-08 NOTE — ED Notes (Signed)
Pt called 911 reporting she was suicidal. Pt states her plan is to take pills. Pt reports these thoughts started about 4 hours ago. She reports she has a lot on her mind.

## 2015-05-08 NOTE — BH Assessment (Signed)
Assessment completed. Consulted [name], PA-C who recommended inpatient treatment. TTS to seek placement. Informed Dr. Dayna Barker of recommendation.

## 2015-05-08 NOTE — ED Notes (Signed)
Pt is ambulating to room 41 with staff. Pt's belongings are being transported with patient.

## 2015-05-08 NOTE — BH Assessment (Signed)
Tele Assessment Note   [name] is an 25 y.o. female presenting to [facility] reporting suicidal ideations with a plan to overdose on her depression medication. Pt stated " I have been having suicidal thoughts for a couple of hours". Pt reported that her medications are not working. Pt reported that she has attempted suicide multiple times in the past and has had several psychiatric hospitalizations. Pt reported that is currently receiving medication management through [facility] and reported that she is prescribed Invega, Cymbalta and Buspar. Pt denies HI and AVH at this time. Pt reported multiple depressive symptoms and shared that her appetite and weight has increased. Pt denied having access to weapons or firearms. Pt did not report any pending criminal charges or upcoming court dates. PT did not report any alcohol or illicit substance abuse.  Inpatient treatment is recommended.   Diagnosis: Major Depressive Disorder, Recurrent episode, Moderate   Past Medical History:  Past Medical History  Diagnosis Date  . Anxiety   . Mental disorder   . Depression   . Cognitive deficits   . Bipolar 1 disorder ([place])   . Diabetes mellitus without complication [facility])     Past Surgical History  Procedure Laterality Date  . Cesarean section    . Tonsillectomy N/A 06/03/2012    Procedure: TONSILLECTOMY;  Surgeon: [name], MD;  Location: [place];  Service: ENT;  Laterality: N/A;  . Mass excision N/A 06/03/2012    Procedure: EXCISION MASS;  Surgeon: [name], MD;  Location: [place];  Service: ENT;  Laterality: N/A;  Excision uvula mass  . Cesarean section N/A 04/25/2013    Procedure: REPEAT CESAREAN SECTION;  Surgeon: [name], MD;  Location: [facility];  Service: Obstetrics;  Laterality: N/A;  . Tonsillectomy      Family History:  Family History  Problem Relation Age of Onset  . Hypertension Mother   . Diabetes Father      Social History:  reports that she has been smoking Cigarettes.  She has a 3.5 pack-year smoking history. She has never used smokeless tobacco. She reports that she does not drink alcohol or use illicit drugs.  Additional Social History:  Alcohol / Drug Use History of alcohol / drug use?: No history of alcohol / drug abuse  CIWA: CIWA-Ar BP: 118/75 mmHg Pulse Rate: 105 COWS:    PATIENT STRENGTHS: (choose at least two) General fund of knowledge Motivation for treatment/growth    Home Medications:  (Not in a hospital admission)  OB/GYN Status:  No LMP recorded. Patient has had an implant.  General Assessment Data Location of Assessment: [facility] TTS Assessment: In system Is this a Tele or Face-to-Face Assessment?: Face-to-Face Is this an Initial Assessment or a Re-assessment for this encounter?: Initial Assessment Marital status: Single Living Arrangements: Parent Can pt return to current living arrangement?: Yes Admission Status: Voluntary Is patient capable of signing voluntary admission?: Yes Referral Source: Self/Family/Friend Insurance type: Medicaid      Crisis Care Plan Living Arrangements: Parent Name of Psychiatrist: [facility]  Name of Therapist: No provider reported  Education Status Is patient currently in school?: No Current Grade: N/A Highest grade of school patient has completed: 34 Name of school: N/A Contact person: N/A  Risk to self with the past 6 months Suicidal Ideation: Yes-Currently Present Has patient been a risk to self within the past 6 months prior to admission? : No Suicidal Intent: Yes-Currently Present Has patient had any suicidal  intent within the past 6 months prior to admission? : No Is patient at risk for suicide?: Yes Suicidal Plan?: Yes-Currently Present Has patient had any suicidal plan within the past 6 months prior to admission? : No Specify Current Suicidal Plan: "overdose on my depression pills"   Access to Means: Yes Specify Access to Suicidal Means: Pt has a prescription.  What has been your use of drugs/alcohol within the last 12 months?: No alcohol and drug use reported.  Previous Attempts/Gestures: Yes How many times?: 4 Other Self Harm Risks: Pt denies  Triggers for Past Attempts: Unpredictable Intentional Self Injurious Behavior: None Family Suicide History: No Recent stressful life event(s): Conflict (Comment) (Conflict with mother ) Persecutory voices/beliefs?: No Depression: Yes Depression Symptoms: Despondent, Tearfulness, Fatigue, Feeling angry/irritable, Feeling worthless/self pity Substance abuse history and/or treatment for substance abuse?: No Suicide prevention information given to non-admitted patients: Not applicable  Risk to Others within the past 6 months Homicidal Ideation: No Does patient have any lifetime risk of violence toward others beyond the six months prior to admission? : No Thoughts of Harm to Others: No Current Homicidal Intent: No Current Homicidal Plan: No Access to Homicidal Means: No Identified Victim: N/A History of harm to others?: No Assessment of Violence: None Noted Violent Behavior Description: No violent behaviors observed. PT is calm and cooperative at this time.  Does patient have access to weapons?: No Criminal Charges Pending?: No Does patient have a court date: No Is patient on probation?: No  Psychosis Hallucinations: None noted Delusions: None noted  Mental Status Report Appearance/Hygiene: In scrubs Eye Contact: Fair Motor Activity: Freedom of movement Speech: Logical/coherent Level of Consciousness: Quiet/awake Mood: Depressed, Sad Affect: Blunted Anxiety Level: None Thought Processes: Relevant, Coherent Judgement: Unimpaired Orientation: Appropriate for developmental age Obsessive Compulsive Thoughts/Behaviors: None  Cognitive Functioning Concentration: Fair Memory: Remote Intact, Recent Intact IQ:  Average Insight: Fair Impulse Control: Fair Appetite: Good (increased ) Weight Loss: 0 Weight Gain:  ("I gained some weight". Amount unknown ) Sleep: No Change Vegetative Symptoms: Staying in bed  ADLScreening [facility] Assessment Services) Patient's cognitive ability adequate to safely complete daily activities?: Yes Patient able to express need for assistance with ADLs?: Yes Independently performs ADLs?: Yes (appropriate for developmental age)  Prior Inpatient Therapy Prior Inpatient Therapy: Yes Prior Therapy Dates: 2016 Prior Therapy Facilty/Provider(s): [facility] Reason for Treatment: Depression   Prior Outpatient Therapy Prior Outpatient Therapy: Yes Prior Therapy Dates: Current  Prior Therapy Facilty/Provider(s): [facility]  Reason for Treatment: Medication management  Does patient have an ACCT team?: No Does patient have Intensive In-House Services?  : No Does patient have [facility] services? : No Does patient have P4CC services?: No  ADL Screening (condition at time of admission) Patient's cognitive ability adequate to safely complete daily activities?: Yes Is the patient deaf or have difficulty hearing?: No Does the patient have difficulty seeing, even when wearing glasses/contacts?: No Does the patient have difficulty concentrating, remembering, or making decisions?: No Patient able to express need for assistance with ADLs?: Yes Does the patient have difficulty dressing or bathing?: No Independently performs ADLs?: Yes (appropriate for developmental age)       Abuse/Neglect Assessment (Assessment to be complete while patient is alone) Physical Abuse: Denies Verbal Abuse: Denies Sexual Abuse: Denies Exploitation of patient/patient's resources: Denies Self-Neglect: Denies     Regulatory affairs officer (For Healthcare) Does patient have an advance directive?: No Would patient like information on creating an advanced directive?: No - patient declined  information  Additional Information 1:1 In Past 12 Months?: No CIRT Risk: No Elopement Risk: No Does patient have medical clearance?: Yes     Disposition:  Disposition Initial Assessment Completed for this Encounter: Yes Disposition of Patient: Inpatient treatment program Type of inpatient treatment program: Adult  Azazel Franze S 05/08/2015 11:10 PM

## 2015-05-08 NOTE — BH Assessment (Signed)
Pt has been accepted to [facility] Room 401-1 to Dr. Parke Poisson. Informed Dr. Dayna Barker of disposition.

## 2015-05-08 NOTE — ED Notes (Signed)
Informed patient that urine sample is needed. She reports she does not need to urinate. Urine cup at bedside.

## 2015-05-08 NOTE — ED Provider Notes (Signed)
CSN: [id number]     Arrival date & time 05/08/15  1938 History   First MD Initiated Contact with Patient 05/08/15 2043     Chief Complaint  Patient presents with  . Suicidal     (Consider location/radiation/quality/duration/timing/severity/associated sxs/prior Treatment) Patient is a 25 y.o. female presenting with mental health disorder.  Mental Health Problem Presenting symptoms: suicidal thoughts and suicidal threats   Presenting symptoms: no self mutilation   Degree of incapacity (severity):  Mild Onset quality:  Gradual Timing:  Constant Progression:  Worsening Chronicity:  Recurrent Associated symptoms: no anxiety     Past Medical History  Diagnosis Date  . Anxiety   . Mental disorder   . Depression   . Cognitive deficits   . Bipolar 1 disorder ([place])   . Diabetes mellitus without complication [facility])    Past Surgical History  Procedure Laterality Date  . Cesarean section    . Tonsillectomy N/A 06/03/2012    Procedure: TONSILLECTOMY;  Surgeon: [name], MD;  Location: [place];  Service: ENT;  Laterality: N/A;  . Mass excision N/A 06/03/2012    Procedure: EXCISION MASS;  Surgeon: [name], MD;  Location: [place];  Service: ENT;  Laterality: N/A;  Excision uvula mass  . Cesarean section N/A 04/25/2013    Procedure: REPEAT CESAREAN SECTION;  Surgeon: [name], MD;  Location: [facility];  Service: Obstetrics;  Laterality: N/A;  . Tonsillectomy     Family History  Problem Relation Age of Onset  . Hypertension Mother   . Diabetes Father    Social History  Substance Use Topics  . Smoking status: Current Some Day Smoker -- 0.50 packs/day for 7 years    Types: Cigarettes  . Smokeless tobacco: Never Used  . Alcohol Use: No   OB History    Gravida Para Term Preterm AB TAB SAB Ectopic Multiple Living   3 3 3  0 0 0 0 0 0 3     Review of Systems  Psychiatric/Behavioral: Positive for suicidal ideas, dysphoric mood and  decreased concentration. Negative for self-injury. The patient is not nervous/anxious.   All other systems reviewed and are negative.     Allergies  Penicillins  Home Medications   Prior to Admission medications   Medication Sig Start Date End Date Taking? Authorizing Provider  busPIRone (BUSPAR) 10 MG tablet Take 10 mg by mouth 3 (three) times daily.   Yes Historical Provider, MD  DULoxetine (CYMBALTA) 60 MG capsule Take 60 mg by mouth daily.   Yes Historical Provider, MD  metFORMIN (GLUCOPHAGE) 500 MG tablet Take 1 tablet (500 mg total) by mouth 2 (two) times daily. For diabetes management 02/21/15  Yes Kerrie Buffalo, NP  Paliperidone Palmitate (INVEGA SUSTENNA) 117 MG/0.75ML SUSP Inject 117 mg into the muscle every 28 (twenty-eight) days. Resume as per outpatient prescriber 02/21/15  Yes Kerrie Buffalo, NP  busPIRone (BUSPAR) 7.5 MG tablet Take 1 tablet (7.5 mg total) by mouth 3 (three) times daily. Patient not taking: Reported on 05/08/2015 02/21/15   Kerrie Buffalo, NP  DULoxetine (CYMBALTA) 30 MG capsule Take 3 capsules (90 mg total) by mouth daily. Patient not taking: Reported on 05/08/2015 02/21/15   Kerrie Buffalo, NP   BP 118/75 mmHg  Pulse 105  Temp(Src) 98 F (36.7 C) (Oral)  Resp 18  Ht 5\' 7"  (1.702 m)  Wt 240 lb (108.863 kg)  BMI 37.58 kg/m2  SpO2 98% Physical Exam  Constitutional: She is oriented to person, place, and  time. She appears well-developed and well-nourished.  HENT:  Head: Normocephalic and atraumatic.  Neck: Normal range of motion.  Cardiovascular: Normal rate and regular rhythm.   Pulmonary/Chest: Effort normal. No stridor. No respiratory distress. She has no wheezes.  Abdominal: Soft. She exhibits no distension. There is no tenderness.  Musculoskeletal: Normal range of motion. She exhibits no edema or tenderness.  Neurological: She is alert and oriented to person, place, and time. No cranial nerve deficit. Coordination normal.  Skin: Skin is warm  and dry.  Psychiatric: Her mood appears not anxious. Her affect is not blunt. Her speech is not rapid and/or pressured. She is not agitated and not slowed. She expresses suicidal ideation. She expresses no homicidal ideation. She expresses suicidal plans. She expresses no homicidal plans.  Nursing note and vitals reviewed.   ED Course  Procedures (including critical care time) Labs Review Labs Reviewed  COMPREHENSIVE METABOLIC PANEL  ETHANOL  SALICYLATE LEVEL  ACETAMINOPHEN LEVEL  CBC  URINE RAPID DRUG SCREEN, HOSP PERFORMED  POC URINE PREG, ED    Imaging Review No results found. I have personally reviewed and evaluated these images and lab results as part of my medical decision-making.   EKG Interpretation None      MDM   Final diagnoses:  None     worsening depression and now suicidal thoughts as well. Has a history of overdose attempts and plans to overdose this time as well. TTS consulted and recommended inpatient admission.    Merrily Pew, MD 05/08/15 [phone]

## 2015-05-09 ENCOUNTER — Inpatient Hospital Stay (HOSPITAL_COMMUNITY)
Admission: EM | Admit: 2015-05-09 | Discharge: 2015-05-14 | DRG: 885 | Disposition: A | Discharge status: Home / Self Care | Payer: Medicaid Other | Source: Intra-hospital | Attending: Psychiatry | Admitting: Psychiatry

## 2015-05-09 ENCOUNTER — Encounter (HOSPITAL_COMMUNITY): Payer: Self-pay

## 2015-05-09 DIAGNOSIS — F411 Generalized anxiety disorder: Secondary | ICD-10-CM | POA: Diagnosis present

## 2015-05-09 DIAGNOSIS — Z8249 Family history of ischemic heart disease and other diseases of the circulatory system: Secondary | ICD-10-CM | POA: Diagnosis not present

## 2015-05-09 DIAGNOSIS — F332 Major depressive disorder, recurrent severe without psychotic features: Secondary | ICD-10-CM | POA: Diagnosis not present

## 2015-05-09 DIAGNOSIS — E119 Type 2 diabetes mellitus without complications: Secondary | ICD-10-CM | POA: Diagnosis present

## 2015-05-09 DIAGNOSIS — R45851 Suicidal ideations: Secondary | ICD-10-CM | POA: Diagnosis present

## 2015-05-09 DIAGNOSIS — G4700 Insomnia, unspecified: Secondary | ICD-10-CM | POA: Diagnosis present

## 2015-05-09 DIAGNOSIS — F17210 Nicotine dependence, cigarettes, uncomplicated: Secondary | ICD-10-CM | POA: Diagnosis present

## 2015-05-09 DIAGNOSIS — F339 Major depressive disorder, recurrent, unspecified: Secondary | ICD-10-CM | POA: Diagnosis not present

## 2015-05-09 DIAGNOSIS — F329 Major depressive disorder, single episode, unspecified: Secondary | ICD-10-CM | POA: Diagnosis present

## 2015-05-09 DIAGNOSIS — G4710 Hypersomnia, unspecified: Secondary | ICD-10-CM | POA: Diagnosis present

## 2015-05-09 DIAGNOSIS — Z915 Personal history of self-harm: Secondary | ICD-10-CM | POA: Diagnosis not present

## 2015-05-09 DIAGNOSIS — Z833 Family history of diabetes mellitus: Secondary | ICD-10-CM | POA: Diagnosis not present

## 2015-05-09 DIAGNOSIS — F314 Bipolar disorder, current episode depressed, severe, without psychotic features: Secondary | ICD-10-CM | POA: Diagnosis present

## 2015-05-09 DIAGNOSIS — Z794 Long term (current) use of insulin: Secondary | ICD-10-CM

## 2015-05-09 LAB — GLUCOSE, CAPILLARY
GLUCOSE-CAPILLARY: 172 mg/dL — AB (ref 65–99)
GLUCOSE-CAPILLARY: 152 mg/dL — AB (ref 65–99)
Glucose-Capillary: 166 mg/dL — ABNORMAL HIGH (ref 65–99)
Glucose-Capillary: 158 mg/dL — ABNORMAL HIGH (ref 65–99)
Glucose-Capillary: 133 mg/dL — ABNORMAL HIGH (ref 65–99)

## 2015-05-09 MED ORDER — BUSPIRONE HCL 10 MG PO TABS
10.0000 mg | ORAL_TABLET | Freq: Two times a day (BID) | ORAL | Status: DC
  Administered 2015-05-09 – 2015-05-10 (×2): 10 mg via ORAL
  Filled 2015-05-09: qty 2
  Filled 2015-05-09 (×2): qty 1
  Filled 2015-05-09: qty 2
  Filled 2015-05-09 (×4): qty 1

## 2015-05-09 MED ORDER — LORATADINE 10 MG PO TABS
10.0000 mg | ORAL_TABLET | Freq: Every day | ORAL | Status: DC
  Administered 2015-05-09 – 2015-05-14 (×6): 10 mg via ORAL
  Filled 2015-05-09 (×8): qty 1

## 2015-05-09 MED ORDER — ALUM & MAG HYDROXIDE-SIMETH 200-200-20 MG/5ML PO SUSP: 30.0000 mL | ORAL | Status: DC | PRN

## 2015-05-09 MED ORDER — FLUTICASONE PROPIONATE 50 MCG/ACT NA SUSP
2.0000 | Freq: Every day | NASAL | Status: DC
  Administered 2015-05-09 – 2015-05-14 (×5): 2 via NASAL
  Filled 2015-05-09: qty 16

## 2015-05-09 MED ORDER — TRAZODONE HCL 50 MG PO TABS
50.0000 mg | ORAL_TABLET | Freq: Every evening | ORAL | Status: DC | PRN
  Filled 2015-05-09 (×4): qty 1

## 2015-05-09 MED ORDER — TRAZODONE HCL 50 MG PO TABS
50.0000 mg | ORAL_TABLET | Freq: Every evening | ORAL | Status: DC | PRN
  Filled 2015-05-09: qty 1

## 2015-05-09 MED ORDER — ACETAMINOPHEN 325 MG PO TABS: 650.0000 mg | ORAL_TABLET | Freq: Four times a day (QID) | ORAL | Status: DC | PRN

## 2015-05-09 MED ORDER — INSULIN ASPART 100 UNIT/ML ~~LOC~~ SOLN
0.0000 [IU] | Freq: Three times a day (TID) | SUBCUTANEOUS | Status: DC
  Administered 2015-05-09 (×3): 4 [IU] via SUBCUTANEOUS
  Administered 2015-05-10 (×3): 3 [IU] via SUBCUTANEOUS
  Administered 2015-05-11: 7 [IU] via SUBCUTANEOUS
  Administered 2015-05-11: 3 [IU] via SUBCUTANEOUS
  Administered 2015-05-11: 4 [IU] via SUBCUTANEOUS
  Administered 2015-05-12 (×2): 7 [IU] via SUBCUTANEOUS
  Administered 2015-05-13 (×3): 4 [IU] via SUBCUTANEOUS
  Administered 2015-05-14: 7 [IU] via SUBCUTANEOUS
  Administered 2015-05-14: 4 [IU] via SUBCUTANEOUS

## 2015-05-09 MED ORDER — HYDROXYZINE HCL 25 MG PO TABS
25.0000 mg | ORAL_TABLET | Freq: Four times a day (QID) | ORAL | Status: DC | PRN
  Administered 2015-05-10: 25 mg via ORAL
  Filled 2015-05-09: qty 1

## 2015-05-09 MED ORDER — ZIPRASIDONE MESYLATE 20 MG IM SOLR: 20.0000 mg | INTRAMUSCULAR | Status: DC | PRN

## 2015-05-09 MED ORDER — MAGNESIUM HYDROXIDE 400 MG/5ML PO SUSP: 30.0000 mL | Freq: Every day | ORAL | Status: DC | PRN

## 2015-05-09 MED ORDER — DULOXETINE HCL 60 MG PO CPEP
60.0000 mg | ORAL_CAPSULE | Freq: Two times a day (BID) | ORAL | Status: DC
  Administered 2015-05-09 – 2015-05-14 (×10): 60 mg via ORAL
  Filled 2015-05-09 (×17): qty 1

## 2015-05-09 MED ORDER — RISPERIDONE 2 MG PO TBDP: 2.0000 mg | ORAL_TABLET | Freq: Three times a day (TID) | ORAL | Status: DC | PRN

## 2015-05-09 MED ORDER — LORAZEPAM 1 MG PO TABS: 1.0000 mg | ORAL_TABLET | ORAL | Status: DC | PRN

## 2015-05-09 MED ORDER — INSULIN ASPART 100 UNIT/ML ~~LOC~~ SOLN
0.0000 [IU] | Freq: Every day | SUBCUTANEOUS | Status: DC
  Administered 2015-05-11 – 2015-05-13 (×3): 2 [IU] via SUBCUTANEOUS

## 2015-05-09 MED ORDER — NICOTINE 21 MG/24HR TD PT24
21.0000 mg | MEDICATED_PATCH | Freq: Every day | TRANSDERMAL | Status: DC
  Administered 2015-05-10 – 2015-05-14 (×4): 21 mg via TRANSDERMAL
  Filled 2015-05-09 (×9): qty 1

## 2015-05-09 MED ORDER — METFORMIN HCL 500 MG PO TABS
500.0000 mg | ORAL_TABLET | Freq: Two times a day (BID) | ORAL | Status: DC
  Administered 2015-05-09 – 2015-05-14 (×11): 500 mg via ORAL
  Filled 2015-05-09 (×15): qty 1

## 2015-05-09 NOTE — Progress Notes (Signed)
Patient ID: [name], female   DOB: April 09, 1991, 25 y.o.   MRN: [id number] Admission note: D:Patient is a voluntary admission in no acute distress for depression and SI for past 8 hours. Pt report plant to OD on medication. [facility] she was prescribed Cymbalta, invega, Buspar. pt reports current medication has not been effective. Pt endorses SI but contract to come to staff before acting on harmful behavior. Pt denies HI/AVH and pain. Pt on disability and lives with her mother.  A: Pt admitted to unit per protocol, skin assessment and belonging search done. No skin issues noted. Consent signed by pt. Pt educated on therapeutic milieu rules. Pt was introduced to milieu by nursing staff. Fall risk safety plan explained to the patient. 15 minutes checks started for safety.  R: Pt was receptive to education. Writer offered support.

## 2015-05-09 NOTE — Progress Notes (Signed)
Pt did not attend wrap up group meeting but was invited by the tech.

## 2015-05-09 NOTE — Progress Notes (Signed)
Patient ID: [name], female   DOB: Dec 15, 1990, 25 y.o.   MRN: [id number] Psychoeducational Group Note  Date:  05/09/2015 Time: 09:00am  Group Topic/Focus:  Orientation:   The focus of this group is to educate the patient on the purpose and policies of crisis stabilization and provide a format to answer questions about their admission.  The group details unit policies and expectations of patients while admitted.  Participation Level:  Did Not Attend  Participation Quality: n/a  Affect: n/a  Cognitive: n/a  Insight: n/a  Engagement in Group:  n/a  Modes of Intervention:  Discussion, Education, Orientation and Support  Additional Comments:  Pt did not attend group, pt in bed asleep.   Elenore Rota 05/09/2015, 10:16 AM

## 2015-05-09 NOTE — Progress Notes (Signed)
Patient ID: [name], female   DOB: [birth year], 25 y.o.   MRN: [id number]  D: Patient slept in this am due to getting here so early in the am on third shift. Patient reports increased depression giving it a "5" on scale with anxiety "10". Feelings of hopelessness at a "3". Reports some SI on and off but no active plan at this time. Contracts for safety. A: Staff will monitor on q 15 minute checks, follow treatment plan, and give medications as ordered. R: Cooperative on the unit.

## 2015-05-09 NOTE — [facility] Suicide Risk Assessment (Signed)
[facility] Admission Suicide Risk Assessment   Nursing information obtained from:  Patient, Review of record Demographic factors:  Unemployed Current Mental Status:  Suicidal ideation indicated by patient, Suicide plan Loss Factors:  NA Historical Factors:  Prior suicide attempts, Family history of mental illness or substance abuse Risk Reduction Factors:  Responsible for children under 38 years of age, Living with another person, especially a relative, Positive coping skills or problem solving skills Total Time spent with patient: 45 minutes Principal Problem:  Major Depression, Recurrent , No psychotic features  Diagnosis:   Patient Active Problem List   Diagnosis Date Noted  . Bipolar 1 disorder, depressed, severe ([place]) [F31.4] 05/09/2015  . MDD (major depressive disorder) ([place]) [F32.9] 02/20/2015  . Suicidal ideation [R45.851]   . 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 Recurrent major depression-severe ([place]) [F33.2] 12/22/2014  . Drug overdose [T50.901A]   . Cognitive deficits [R41.89] 10/12/2012  . Generalized anxiety disorder [F41.1] 06/28/2012     Continued Clinical Symptoms:  Alcohol Use Disorder Identification Test Final Score (AUDIT): 0 The "Alcohol Use Disorders Identification Test", Guidelines for Use in Primary Care, Second Edition.  World Pharmacologist [facility]). Score between 0-7:  no or low risk or alcohol related problems. Score between 8-15:  moderate risk of alcohol related problems. Score between 16-19:  high risk of alcohol related problems. Score 20 or above:  warrants further diagnostic evaluation for alcohol dependence and treatment.   CLINICAL FACTORS:  25 year old female, reports worsening depression and suicidal thoughts of overdosing. Reports prior history of being diagnosed with Bipolar Disorder, but at this time denies history of mania or hypomania. History of responding well to Cymbalta and Buspar     Psychiatric Specialty Exam: Physical Exam  ROS  Blood pressure 129/48, pulse 104, temperature 98.5 F (36.9 C), temperature source Oral, resp. rate 18, height 5' 5.5" (1.664 m), weight 274 lb (124.286 kg).Body mass index is 44.89 kg/(m^2).   see admit note MSE                                                        COGNITIVE FEATURES THAT CONTRIBUTE TO RISK:  Decrease in level of functioning   SUICIDE RISK:   Moderate:  Frequent suicidal ideation with limited intensity, and duration, some specificity in terms of plans, no associated intent, good self-control, limited dysphoria/symptomatology, some risk factors present, and identifiable protective factors, including available and accessible social support.  PLAN OF CARE: Patient will be admitted to inpatient psychiatric unit for stabilization and safety. Will provide and encourage milieu participation. Provide medication management and maked adjustments as needed.  Will follow daily.    Medical Decision Making:  Review of Psycho-Social Stressors (1), Review or order clinical lab tests (1), Established Problem, Worsening (2) and Review of New Medication or Change in Dosage (2)  I certify that inpatient services furnished can reasonably be expected to improve the patient's condition.   COBOS, FERNANDO 05/09/2015, 2:31 PM

## 2015-05-09 NOTE — ED Notes (Signed)
Patient admits to [facility] with a plan to over dose on her medications. Patient denies HI and AVH at this time. Patient oriented to the unit. Patient voices no complaints or concerns at this time. Encouragement and support provided safety maintain. Q 15 min safety checks in place.

## 2015-05-09 NOTE — ED Notes (Signed)
Pt transported to [facility] by [facility] transportation service for continuation of specialized care. Belongings given to driver after patient signed for them. Pt left in no acute distress. 

## 2015-05-09 NOTE — [facility] Group Notes (Signed)
[facility] LCSW Group Therapy 05/09/2015 1:15 PM Type of Therapy: Group Therapy Participation Level: Active  Participation Quality: Attentive, Sharing and Supportive  Affect: Depressed and Flat  Cognitive: Alert and Oriented  Insight: Developing/Improving and Engaged  Engagement in Therapy: Developing/Improving and Engaged  Modes of Intervention: Activity, Clarification, Confrontation, Discussion, Education, Exploration, Limit-setting, Orientation, Problem-solving, Rapport Building, Reality Testing, Socialization and Support  Summary of Progress/Problems: Patient was attentive and engaged with speaker from [employer]. Patient was attentive to speaker while they shared their story of dealing with mental health and overcoming it. Patient expressed interest in their programs and services and received information on their agency. Patient processed ways they can relate to the speaker.   Brynlynn Walko, MSW, LCSWA Clinical Social Worker [facility] [phone]   

## 2015-05-09 NOTE — Progress Notes (Signed)
Nutrition Education Note  Pt attended group focusing on general, healthful nutrition education.  RD emphasized the importance of eating regular meals and snacks throughout the day. Consuming sugar-free beverages and incorporating fruits and vegetables into diet when possible. Provided examples of healthy snacks. Patient encouraged to leave group with a goal to improve nutrition/healthy eating.   Diet Order: Diet regular Room service appropriate?: Yes; Fluid consistency:: Thin Pt is also offered choice of unit snacks mid-morning and mid-afternoon.  Pt is eating as desired.   If additional nutrition issues arise, please consult RD.     Tylique Aull, RD, LDN Inpatient Clinical Dietitian Pager # [phone] After hours/weekend pager # [phone]     
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
## 2015-05-09 NOTE — Tx Team (Signed)
Interdisciplinary Treatment Plan Update (Adult) Date: 05/09/2015    Time Reviewed: 9:30 AM  Progress in Treatment: Attending groups: Continuing to assess, patient new to milieu Participating in groups: Continuing to assess, patient new to milieu Taking medication as prescribed: Yes Tolerating medication: Yes Family/Significant other contact made: No, CSW assessing for appropriate contacts Patient understands diagnosis: Yes Discussing patient identified problems/goals with staff: Yes Medical problems stabilized or resolved: Yes Denies suicidal/homicidal ideation: Yes Issues/concerns per patient self-inventory: Yes Other:  New problem(s) identified: N/A  Discharge Plan or Barriers: CSW continuing to assess, patient new to milieu. Will likely return home with outpatient services.  Reason for Continuation of Hospitalization:  Depression Anxiety Medication Stabilization   Comments: N/A  Estimated length of stay: 3-5 days    Patient is a 25 year old female admitted for depression and SI with plan to OD on medication. Patient will benefit from crisis stabilization, medication evaluation, group therapy and psycho education in addition to case management for discharge planning. At discharge, it is recommended that Pt remain compliant with established discharge plan and continued treatment.   Review of initial/current patient goals per problem list:  1. Goal(s): Patient will participate in aftercare plan   Met: No   Target date: 3-5 days post admission date   As evidenced by: Patient will participate within aftercare plan AEB aftercare provider and housing plan at discharge being identified.  1/12: Goal not met: CSW assessing for appropriate referrals for pt and will have follow up secured prior to d/c.    2. Goal (s): Patient will exhibit decreased depressive symptoms and suicidal ideations.   Met: No   Target date: 3-5 days post admission date   As evidenced by:  Patient will utilize self rating of depression at 3 or below and demonstrate decreased signs of depression or be deemed stable for discharge by MD.  1/12: Goal not met: Pt presents with flat affect and depressed mood.  Pt admitted with depression rating of 10.  Pt to show decreased sign of depression and a rating of 3 or less before d/c.       Attendees: Patient:    Family:    Physician: Dr. Parke Poisson; Dr. Sabra Heck 05/09/2015 9:30 AM  Nursing:  Grayland Ormond, Janann August, Malena Catholic., RN 05/09/2015 9:30 AM  Clinical Social Worker: Tilden Fossa, [place] 05/09/2015 9:30 AM  Other: Peri Maris, LCSWA; [place], LCSW  05/09/2015 9:30 AM  Other:  05/09/2015 9:30 AM  Other: Lars Pinks, Case Manager 05/09/2015 9:30 AM  Other: Agustina Caroli, May Augustin , NP 05/09/2015 9:30 AM  Other:      Scribe for Treatment Team:  Tilden Fossa, MSW, [place] [phone]

## 2015-05-09 NOTE — Tx Team (Signed)
Initial Interdisciplinary Treatment Plan   PATIENT STRESSORS: Medication change or noncompliance   PATIENT STRENGTHS: Ability for insight Average or above average intelligence Motivation for treatment/growth Supportive family/friends   PROBLEM LIST: Problem List/Patient Goals Date to be addressed Date deferred Reason deferred Estimated date of resolution  DEPRESSION 03/2016     suicidal ideation with plan 05/09/2015     anxiety      "depression"      "medicaion adjustment"                               DISCHARGE CRITERIA:  Adequate post-discharge living arrangements Improved stabilization in mood, thinking, and/or behavior Medical problems require only outpatient monitoring  PRELIMINARY DISCHARGE PLAN: Attend PHP/IOP Outpatient therapy Return to previous living arrangement  PATIENT/FAMIILY INVOLVEMENT: This treatment plan has been presented to and reviewed with the patient, [name], and/or family member, The patient and family have been given the opportunity to ask questions and make suggestions.  JEHU-APPIAH, Jenifer Struve K 05/09/2015, 3:32 AM

## 2015-05-09 NOTE — Progress Notes (Signed)
Patient ID: [name], female   DOB: 1991-01-30, 25 y.o.   MRN: [id number] D: Client in bed this shift, does not come out onto the milieu. Client reports depression "5" of 10. When asked what brought her to the hospital "I was having thoughts of suicide" Client did not report a plan. A: Writer encouraged client to report any concerns, noting that medications was available for anxiety and sleep is she felt the need. Client was encouraged to attended group. Staff will monitor q64min for safety. R: Client is safe on the unit, did not attend group.

## 2015-05-10 LAB — GLUCOSE, CAPILLARY
GLUCOSE-CAPILLARY: 142 mg/dL — AB (ref 65–99)
GLUCOSE-CAPILLARY: 141 mg/dL — AB (ref 65–99)

## 2015-05-10 MED ORDER — LORAZEPAM 0.5 MG PO TABS
0.5000 mg | ORAL_TABLET | Freq: Four times a day (QID) | ORAL | Status: DC | PRN
  Administered 2015-05-10 – 2015-05-13 (×5): 0.5 mg via ORAL
  Filled 2015-05-10 (×6): qty 1

## 2015-05-10 MED ORDER — BUSPIRONE HCL 15 MG PO TABS
15.0000 mg | ORAL_TABLET | Freq: Two times a day (BID) | ORAL | Status: DC
  Administered 2015-05-10 – 2015-05-14 (×8): 15 mg via ORAL
  Filled 2015-05-10 (×12): qty 1

## 2015-05-10 NOTE — Progress Notes (Signed)
NSG shift assessment. 7a-7p.   D: Affect blunted, mood depressed, behavior appropriate. Attends groups and participates. Pt slept good last night and did not need sleep medication.  Her appetite is good. She rates her depression as 8; hopelessness as 9 and anxiety is 10. She continues to have thoughts of hurting herself. She said that she is safe and contracts for safety.  The thing that she wants to work on today is her anxiety and she will go to groups to accomplish reducing her anxiety, and medication.  Cooperative with staff and is getting along well with peers.   A: Observed pt interacting in group and in the milieu: Support and encouragement offered. Safety maintained with observations every 15 minutes.   R:  Contracts for safety and continues to follow the treatment plan, working on learning new coping skills.

## 2015-05-10 NOTE — Progress Notes (Signed)
Patient ID: [name], female   DOB: 1990/09/24, 25 y.o.   MRN: [id number] D: Client visible on the unit, seen in dayroom watching TV, but no interacting noted. Client was seen on the phone though and reports she has talked with BF today. Client reports "still have bad nerve" "be thinking about my BF and what he doing" "He tell lies" Client reports SI, "sometimes" but no plans. A: Writer provide emotional support encouraged client to focus on her emotional well being. Medications reviewed and administered as prescribed. Staff will monitor q78min for safety. R: Client is safe on unit, attended group.

## 2015-05-10 NOTE — BHH Group Notes (Signed)
[facility] LCSW Aftercare Discharge Planning Group Note  05/10/2015 8:45 AM  Pt did not attend, declined invitation.   [name], [name] 05/10/2015 4:35 PM

## 2015-05-10 NOTE — Progress Notes (Addendum)
[facility] MD Progress Note  05/10/2015 10:29 AM [name]  MRN:  [id number] Subjective: Patient reports she is still feeling anxious, depressed. Today she emphasizes anxiety and states she sometimes cuts or pulls hair when anxious and often bites her lips when anxious , as a way of trying to control anxiety. Denies medication side effects at this time but feels they are not helping her anxiety symptoms yet . Objective :  I have discussed case with treatment team and have met with patient. Patient reports ongoing anxiety, depression- denies any suicidal ideations, contracts for safety. She focuses on subjective sense of anxiety as major symptom at this time. Denies medication side effects at this time. No disruptive or agitated behaviors on unit . Expresses concern about previous weight gain on psychiatric medications .  Principal Problem: MDD (major depressive disorder) ([place]) Diagnosis:   Patient Active Problem List   Diagnosis Date Noted  . Bipolar 1 disorder, depressed, severe ([place]) [F31.4] 05/09/2015  . MDD (major depressive disorder) ([place]) [F32.9] 02/20/2015  . Suicidal ideation [R45.851]   . Severe recurrent major depressive disorder with psychotic features ([place]) [F33.3]   . Recurrent major depression-severe ([place]) [F33.2] 12/22/2014  . Drug overdose [T50.901A]   . Cognitive deficits [R41.89] 10/12/2012  . Generalized anxiety disorder [F41.1] 06/28/2012   Total Time spent with patient: 20 minutes    Past Medical History:  Past Medical History  Diagnosis Date  . Anxiety   . Mental disorder   . Depression   . Cognitive deficits   . Bipolar 1 disorder ([place])   . Diabetes mellitus without complication [facility])     Past Surgical History  Procedure Laterality Date  . Cesarean section    . Tonsillectomy N/A 06/03/2012    Procedure: TONSILLECTOMY;  Surgeon: [name], MD;  Location: [place];  Service: ENT;  Laterality: N/A;  . Mass excision N/A 06/03/2012   Procedure: EXCISION MASS;  Surgeon: [name], MD;  Location: [place];  Service: ENT;  Laterality: N/A;  Excision uvula mass  . Cesarean section N/A 04/25/2013    Procedure: REPEAT CESAREAN SECTION;  Surgeon: [name], MD;  Location: [facility];  Service: Obstetrics;  Laterality: N/A;  . Tonsillectomy     Family History:  Family History  Problem Relation Age of Onset  . Hypertension Mother   . Diabetes Father     Social History:  History  Alcohol Use No     History  Drug Use No    Comment: Patient denies    Social History   Social History  . Marital Status: Single    Spouse Name: N/A  . Number of Children: N/A  . Years of Education: N/A   Social History Main Topics  . Smoking status: Current Every Day Smoker -- 0.50 packs/day for 7 years    Types: Cigarettes  . Smokeless tobacco: Never Used  . Alcohol Use: No  . Drug Use: No     Comment: Patient denies  . Sexual Activity: No   Other Topics Concern  . None   Social History Narrative   Additional Social History:   Sleep: Good  Appetite:  Good  Current Medications: Current Facility-Administered Medications  Medication Dose Route Frequency Provider Last Rate Last Dose  . acetaminophen (TYLENOL) tablet 650 mg  650 mg Oral Q6H PRN Laverle Hobby, PA-C      . alum & mag hydroxide-simeth (MAALOX/MYLANTA) 200-200-20 MG/5ML suspension 30 mL  30 mL Oral Q4H PRN  Laverle Hobby, PA-C      . busPIRone (BUSPAR) tablet 10 mg  10 mg Oral BID Jenne Campus, MD   10 mg at 05/10/15 0805  . DULoxetine (CYMBALTA) DR capsule 60 mg  60 mg Oral BID Jenne Campus, MD   60 mg at 05/10/15 0805  . fluticasone (FLONASE) 50 MCG/ACT nasal spray 2 spray  2 spray Each Nare Daily Laverle Hobby, PA-C   2 spray at 05/10/15 1610  . hydrOXYzine (ATARAX/VISTARIL) tablet 25 mg  25 mg Oral Q6H PRN Laverle Hobby, PA-C      . insulin aspart (novoLOG) injection 0-20 Units  0-20 Units Subcutaneous TID WC Laverle Hobby, PA-C   3 Units at 05/10/15 [id number]  . insulin aspart (novoLOG) injection 0-5 Units  0-5 Units Subcutaneous QHS Laverle Hobby, PA-C   0 Units at 05/09/15 2134  . loratadine (CLARITIN) tablet 10 mg  10 mg Oral Daily Laverle Hobby, PA-C   10 mg at 05/10/15 0806  . risperiDONE (RISPERDAL M-TABS) disintegrating tablet 2 mg  2 mg Oral Q8H PRN Laverle Hobby, PA-C       And  . LORazepam (ATIVAN) tablet 1 mg  1 mg Oral PRN Laverle Hobby, PA-C       And  . ziprasidone (GEODON) injection 20 mg  20 mg Intramuscular PRN Laverle Hobby, PA-C      . magnesium hydroxide (MILK OF MAGNESIA) suspension 30 mL  30 mL Oral Daily PRN Laverle Hobby, PA-C      . metFORMIN (GLUCOPHAGE) tablet 500 mg  500 mg Oral BID Laverle Hobby, PA-C   500 mg at 05/10/15 0805  . nicotine (NICODERM CQ - dosed in mg/24 hours) patch 21 mg  21 mg Transdermal Daily Jenne Campus, MD   21 mg at 05/10/15 0811  . traZODone (DESYREL) tablet 50 mg  50 mg Oral QHS PRN Jenne Campus, MD        Lab Results:  Results for orders placed or performed during the hospital encounter of 05/09/15 (from the past 48 hour(s))  Glucose, capillary     Status: Abnormal   Collection Time: 05/09/15  2:45 AM  Result Value Ref Range   Glucose-Capillary 166 (H) 65 - 99 mg/dL  Glucose, capillary     Status: Abnormal   Collection Time: 05/09/15  6:10 AM  Result Value Ref Range   Glucose-Capillary 158 (H) 65 - 99 mg/dL  Glucose, capillary     Status: Abnormal   Collection Time: 05/09/15 11:53 AM  Result Value Ref Range   Glucose-Capillary 172 (H) 65 - 99 mg/dL  Glucose, capillary     Status: Abnormal   Collection Time: 05/09/15  4:40 PM  Result Value Ref Range   Glucose-Capillary 152 (H) 65 - 99 mg/dL  Glucose, capillary     Status: Abnormal   Collection Time: 05/09/15  8:25 PM  Result Value Ref Range   Glucose-Capillary 133 (H) 65 - 99 mg/dL   Comment 1 Notify RN   Glucose, capillary     Status: Abnormal   Collection Time:  05/10/15  6:07 AM  Result Value Ref Range   Glucose-Capillary 142 (H) 65 - 99 mg/dL    Physical Findings: AIMS: Facial and Oral Movements Muscles of Facial Expression: None, normal Lips and Perioral Area: None, normal Jaw: None, normal Tongue: None, normal,Extremity Movements Upper (arms, wrists, hands, fingers): None, normal Lower (legs, knees, ankles, toes): None,  normal, Trunk Movements Neck, shoulders, hips: None, normal, Overall Severity Severity of abnormal movements (highest score from questions above): None, normal Incapacitation due to abnormal movements: None, normal Patient's awareness of abnormal movements (rate only patient's report): No Awareness, Dental Status Current problems with teeth and/or dentures?: No Does patient usually wear dentures?: No  CIWA:    COWS:     Musculoskeletal: Strength & Muscle Tone: within normal limits Gait & Station: normal Patient leans: N/A  Psychiatric Specialty Exam: ROS no chest pain , no shortness of breath  Blood pressure 129/48, pulse 104, temperature 98.5 F (36.9 C), temperature source Oral, resp. rate 18, height 5' 5.5" (1.664 m), weight 274 lb (124.286 kg).Body mass index is 44.89 kg/(m^2).  General Appearance: fairly groomed   Engineer, water::  Good  Speech:  Normal Rate  Volume:  Decreased  Mood:  Anxious and Depressed  Affect:  anxious   Thought Process:  Linear  Orientation:  Other:  fully alert and attentive   Thought Content:  denies hallucinations and does not appear internally preoccupied , no delusions expressed, ruminative , anxious   Suicidal Thoughts:  No- today denies any suicidal ideations, denies any self injurious ideations at this time  Homicidal Thoughts:  No  Memory:  recent and remote grossly intact   Judgement:  Fair  Insight:  Fair  Psychomotor Activity:  Decreased  Concentration:  Good  Recall:  Good  Fund of Knowledge:Good  Language: Good  Akathisia:  Negative  Handed:  Right  AIMS (if  indicated):     Assets:  Desire for Improvement Resilience  ADL's:  Intact  Cognition: WNL  Sleep:  Number of Hours: 6.75  Assessment - patient reports ongoing depression , anxiety, and today focuses on a subjective sense of free floating anxiety, which leads to behaviors such as hair pulling and lip biting at times . No self injurious behaviors on unit and currently denies any SI. Thus far tolerating Cymbalta , Buspar well .  Treatment Plan Summary: Daily contact with patient to assess and evaluate symptoms and progress in treatment, Medication management, Plan inpatient admission and medications as below  Encourage milieu , group participation to work on coping skills and symptom reduction Continue Cymbalta 60 mgrs BID for depression, anxiety Increase  Buspar to 15 mgrs  BID for anxiety Continue Ativan 0.5 mgrs Q 6 hours PRN for anxiety as needed  Continue Trazodone 50 mgrs QHS PRN for insomnia as needed Of note, patient states she did not get requested blood work done this AM, will reorder for PM.   COBOS, FERNANDO 05/10/2015, 10:29 AM

## 2015-05-10 NOTE — Progress Notes (Signed)
Adult Psychoeducational Group Note  Date:  05/10/2015 Time:  9:08 PM  Group Topic/Focus:  Wrap-Up Group:   The focus of this group is to help patients review their daily goal of treatment and discuss progress on daily workbooks.  Participation Level:  Minimal  Participation Quality:  Appropriate  Affect:  Appropriate  Cognitive:  Alert  Insight: Appropriate  Engagement in Group:  Engaged  Modes of Intervention:  Discussion  Additional Comments:  PT was really upset in group because she does not think that the medications are working for her, and she doesn't feel that anybody is listening to her about it.   Wynelle Fanny R 05/10/2015, 9:08 PM

## 2015-05-10 NOTE — BHH Group Notes (Signed)
[place] LCSW Group Therapy 05/10/2015 1:15pm  Type of Therapy: Group Therapy- Feelings Around Relapse and Recovery  Pt did not attend, declined invitation.   Peri Maris, Latanya Presser [phone] 05/10/2015 4:35 PM

## 2015-05-10 NOTE — Progress Notes (Signed)
Recreation Therapy Notes  Date: 01.13.2017  Time: 9:30am Location: 300 Group Room   Group Topic: Stress Management  Goal Area(s) Addresses:  Patient will actively participate in stress management techniques presented during session.   Behavioral Response: Did not attend.   Laureen Ochs Kaylina Cahue, LRT/CTRS        Burdell Peed L 05/10/2015 4:01 PM

## 2015-05-11 DIAGNOSIS — F339 Major depressive disorder, recurrent, unspecified: Secondary | ICD-10-CM

## 2015-05-11 LAB — LIPID PANEL
CHOL/HDL RATIO: 4.3 ratio
Cholesterol: 227 mg/dL — ABNORMAL HIGH (ref 0–200)
HDL: 53 mg/dL (ref >40)
LDL CALC: 116 mg/dL — AB (ref 0–99)
Triglycerides: 289 mg/dL — ABNORMAL HIGH (ref <150)
VLDL: 58 mg/dL — AB (ref 0–40)

## 2015-05-11 LAB — TSH: TSH: 1.353 u[IU]/mL (ref 0.350–4.500)

## 2015-05-11 MED ORDER — OMEGA-3-ACID ETHYL ESTERS 1 G PO CAPS
1.0000 g | ORAL_CAPSULE | Freq: Two times a day (BID) | ORAL | Status: DC
  Administered 2015-05-11 – 2015-05-14 (×6): 1 g via ORAL
  Filled 2015-05-11 (×10): qty 1

## 2015-05-11 NOTE — Progress Notes (Signed)
[facility] MD Progress Note  05/11/2015 12:15 PM [name]  MRN:  [id number]   Subjective: Patient states "I am feeling better and I would like to be discharged to my mothers or a group home."  Objective: Min A Osterkamp is awake, alert and oriented X4 , found attending group session.  Denies suicidal or homicidal ideation. Denies auditory or visual hallucination and does not appear to be responding to internal stimuli. Patient interacts well with staff and others. Patient reports she is medication compliant without mediation side effects. Report learning new coping skills to ask for assistants when she feels the urge to cut her hair. States her depression 3/10. Patient states "I am feeling better and I am ready to be discharge ".Reports good appetite  and resting well. Support, encouragement and reassurance was provided.   Principal Problem: MDD (major depressive disorder) ([place]) Diagnosis:   Patient Active Problem List   Diagnosis Date Noted  . Bipolar 1 disorder, depressed, severe ([place]) [F31.4] 05/09/2015  . MDD (major depressive disorder) ([place]) [F32.9] 02/20/2015  . Suicidal ideation [R45.851]   . Severe recurrent major depressive disorder with psychotic features ([place]) [F33.3]   . Recurrent major depression-severe ([place]) [F33.2] 12/22/2014  . Drug overdose [T50.901A]   . Cognitive deficits [R41.89] 10/12/2012  . Generalized anxiety disorder [F41.1] 06/28/2012   Total Time spent with patient: 20 minutes    Past Medical History:  Past Medical History  Diagnosis Date  . Anxiety   . Mental disorder   . Depression   . Cognitive deficits   . Bipolar 1 disorder ([place])   . Diabetes mellitus without complication [facility])     Past Surgical History  Procedure Laterality Date  . Cesarean section    . Tonsillectomy N/A 06/03/2012    Procedure: TONSILLECTOMY;  Surgeon: [name], MD;  Location: [place];  Service: ENT;  Laterality: N/A;  . Mass excision N/A 06/03/2012   Procedure: EXCISION MASS;  Surgeon: [name], MD;  Location: [place];  Service: ENT;  Laterality: N/A;  Excision uvula mass  . Cesarean section N/A 04/25/2013    Procedure: REPEAT CESAREAN SECTION;  Surgeon: [name], MD;  Location: [facility];  Service: Obstetrics;  Laterality: N/A;  . Tonsillectomy     Family History:  Family History  Problem Relation Age of Onset  . Hypertension Mother   . Diabetes Father     Social History:  History  Alcohol Use No     History  Drug Use No    Comment: Patient denies    Social History   Social History  . Marital Status: Single    Spouse Name: N/A  . Number of Children: N/A  . Years of Education: N/A   Social History Main Topics  . Smoking status: Current Every Day Smoker -- 0.50 packs/day for 7 years    Types: Cigarettes  . Smokeless tobacco: Never Used  . Alcohol Use: No  . Drug Use: No     Comment: Patient denies  . Sexual Activity: No   Other Topics Concern  . None   Social History Narrative   Additional Social History:   Sleep: Good  Appetite:  Good  Current Medications: Current Facility-Administered Medications  Medication Dose Route Frequency Provider Last Rate Last Dose  . acetaminophen (TYLENOL) tablet 650 mg  650 mg Oral Q6H PRN Laverle Hobby, PA-C      . alum & mag hydroxide-simeth (MAALOX/MYLANTA) 200-200-20 MG/5ML suspension 30 mL  30  mL Oral Q4H PRN Laverle Hobby, PA-C      . busPIRone (BUSPAR) tablet 15 mg  15 mg Oral BID Jenne Campus, MD   15 mg at 05/11/15 0856  . DULoxetine (CYMBALTA) DR capsule 60 mg  60 mg Oral BID Jenne Campus, MD   60 mg at 05/11/15 0856  . fluticasone (FLONASE) 50 MCG/ACT nasal spray 2 spray  2 spray Each Nare Daily Laverle Hobby, PA-C   2 spray at 05/11/15 0857  . insulin aspart (novoLOG) injection 0-20 Units  0-20 Units Subcutaneous TID WC Laverle Hobby, PA-C   7 Units at 05/11/15 1213  . insulin aspart (novoLOG) injection 0-5 Units  0-5  Units Subcutaneous QHS Laverle Hobby, PA-C   0 Units at 05/09/15 2134  . loratadine (CLARITIN) tablet 10 mg  10 mg Oral Daily Laverle Hobby, PA-C   10 mg at 05/11/15 0856  . LORazepam (ATIVAN) tablet 0.5 mg  0.5 mg Oral Q6H PRN Jenne Campus, MD   0.5 mg at 05/11/15 0901  . magnesium hydroxide (MILK OF MAGNESIA) suspension 30 mL  30 mL Oral Daily PRN Laverle Hobby, PA-C      . metFORMIN (GLUCOPHAGE) tablet 500 mg  500 mg Oral BID Laverle Hobby, PA-C   500 mg at 05/11/15 0856  . nicotine (NICODERM CQ - dosed in mg/24 hours) patch 21 mg  21 mg Transdermal Daily Jenne Campus, MD   21 mg at 05/11/15 0857  . traZODone (DESYREL) tablet 50 mg  50 mg Oral QHS PRN Jenne Campus, MD        Lab Results:  Results for orders placed or performed during the hospital encounter of 05/09/15 (from the past 48 hour(s))  Glucose, capillary     Status: Abnormal   Collection Time: 05/09/15  4:40 PM  Result Value Ref Range   Glucose-Capillary 152 (H) 65 - 99 mg/dL  Glucose, capillary     Status: Abnormal   Collection Time: 05/09/15  8:25 PM  Result Value Ref Range   Glucose-Capillary 133 (H) 65 - 99 mg/dL   Comment 1 Notify RN   Glucose, capillary     Status: Abnormal   Collection Time: 05/10/15  6:07 AM  Result Value Ref Range   Glucose-Capillary 142 (H) 65 - 99 mg/dL  Glucose, capillary     Status: Abnormal   Collection Time: 05/10/15 11:52 AM  Result Value Ref Range   Glucose-Capillary 141 (H) 65 - 99 mg/dL  TSH     Status: None   Collection Time: 05/11/15  5:00 AM  Result Value Ref Range   TSH 1.353 0.350 - 4.500 uIU/mL    Comment: Performed at [facility]    Physical Findings: AIMS: Facial and Oral Movements Muscles of Facial Expression: None, normal Lips and Perioral Area: None, normal Jaw: None, normal Tongue: None, normal,Extremity Movements Upper (arms, wrists, hands, fingers): None, normal Lower (legs, knees, ankles, toes): None, normal, Trunk  Movements Neck, shoulders, hips: None, normal, Overall Severity Severity of abnormal movements (highest score from questions above): None, normal Incapacitation due to abnormal movements: None, normal Patient's awareness of abnormal movements (rate only patient's report): No Awareness, Dental Status Current problems with teeth and/or dentures?: No Does patient usually wear dentures?: No  CIWA:    COWS:     Musculoskeletal: Strength & Muscle Tone: within normal limits Gait & Station: normal Patient leans: N/A  Psychiatric Specialty Exam: Review of  Systems  Constitutional: Negative.   HENT: Negative.   Respiratory: Negative.   Cardiovascular: Negative.   Gastrointestinal: Negative.   Genitourinary: Negative.   Skin: Negative.   Neurological: Negative.   Endo/Heme/Allergies: Negative.   Psychiatric/Behavioral: Positive for depression. Negative for suicidal ideas and hallucinations. The patient is nervous/anxious.   All other systems reviewed and are negative.  no chest pain , no shortness of breath  Blood pressure 131/55, pulse 109, temperature 98.1 F (36.7 C), temperature source Oral, resp. rate 20, height 5' 5.5" (1.664 m), weight 124.286 kg (274 lb).Body mass index is 44.89 kg/(m^2).  General Appearance: fairly groomed, Pleasant, clam and cooperative  Eye Contact::  Good  Speech:  Normal Rate  Volume:  Decreased  Mood:  Anxious and Depressed  Affect:  anxious   Thought Process:  Linear  Orientation:  Other:  fully alert and attentive   Thought Content:  Rumination  Suicidal Thoughts:  No- today denies any suicidal ideations, denies any self injurious ideations at this time  Homicidal Thoughts:  No  Memory:  recent and remote grossly intact   Judgement:  Fair  Insight:  Fair  Psychomotor Activity:  Restlessness  Concentration:  Good  Recall:  Good  Fund of Knowledge:Good  Language: Good  Akathisia:  Negative  Handed:  Right  AIMS (if indicated):     Assets:   Communication Skills Desire for Improvement Resilience  ADL's:  Intact  Cognition: WNL  Sleep:  Number of Hours: 6.5     I agree with current treatment plan on 05/11/2015, Patient seen face-to-face for psychiatric evaluation follow-up, chart reviewed. Advanced Practice Provider and Treatment team. Reviewed the information documented and agree with the treatment plan.   Treatment Plan Summary: Daily contact with patient to assess and evaluate symptoms and progress in treatment, Medication management, Plan inpatient admission and medications as below    Encourage milieu , group participation to work on coping skills and symptom reduction Continue Cymbalta 60 mgs BID for depression, anxiety Continue  Buspar to 15 mgs  BID for anxiety Continue Ativan 0.5 mgrs Q 6 hours PRN for anxiety as needed  Continue Trazodone 50 mgrs QHS PRN for insomnia as needed Start Omega-3 acid (Lavaza) PO BID for elevated Lipid levels Of note, patient states she did not get requested blood work done this AM, will reorder for PM.  Pending results for A1C,  Lipid panel elevated, TSH and Prolactin/WNL  Derrill Center FNP- [facility] 05/11/2015, 12:15 PM

## 2015-05-11 NOTE — Progress Notes (Signed)
[name] is doing well...sha says this morning.. " Im ready to go home". She completes her daily assessment and on it she wrote she denied SI and she rated her depression, hopelessness and anxiety " 6/7/5", respectively. A She takes her meds as planned. CBG's are monitored as planned. R Safety is in place.

## 2015-05-11 NOTE — BHH Group Notes (Signed)
[place] Group Notes:  (Clinical Social Work)  05/11/2015     1:15-2:15PM  Summary of Progress/Problems:   The main focus of today's process group was to discuss unhealthy coping techniques that the patients often use, and how to consider whether this is something they want to change.  Motivational Interviewing was utilized to help patients explore in depth the perceived benefits and costs of unhealthy coping techniques, as well as the  benefits and costs of replacing that with healthy coping skills.    The patient expressed that their own unhealthy coping involves keeping things in/not telling others she is having a hard time as well as negative thoughts.  She was only able to respond to this question after extensive clarification and examples, initially saying "I don't know, honestly."   She spoke little, but did interact more than in previous group sessions with this CSW.  Type of Therapy:  Group Therapy - Process   Participation Level:  Active  Participation Quality:  Attentive, Sharing   Affect:  Flat  Cognitive:  Appropriate   Insight:  Improving  Engagement in Therapy:  Improving  Modes of Intervention:  Education, Motivational Interviewing  Selmer Dominion, LCSW 05/11/2015, 4:00 PM

## 2015-05-12 DIAGNOSIS — F339 Major depressive disorder, recurrent, unspecified: Secondary | ICD-10-CM | POA: Insufficient documentation

## 2015-05-12 LAB — GLUCOSE, CAPILLARY
GLUCOSE-CAPILLARY: 144 mg/dL — AB (ref 65–99)
GLUCOSE-CAPILLARY: 153 mg/dL — AB (ref 65–99)
GLUCOSE-CAPILLARY: 188 mg/dL — AB (ref 65–99)
GLUCOSE-CAPILLARY: 220 mg/dL — AB (ref 65–99)
GLUCOSE-CAPILLARY: 137 mg/dL — AB (ref 65–99)
GLUCOSE-CAPILLARY: 226 mg/dL — AB (ref 65–99)
GLUCOSE-CAPILLARY: 244 mg/dL — AB (ref 65–99)
Glucose-Capillary: 136 mg/dL — ABNORMAL HIGH (ref 65–99)
Glucose-Capillary: 226 mg/dL — ABNORMAL HIGH (ref 65–99)
Glucose-Capillary: 206 mg/dL — ABNORMAL HIGH (ref 65–99)

## 2015-05-12 LAB — PROLACTIN: PROLACTIN: 42 ng/mL — AB (ref 4.8–23.3)

## 2015-05-12 NOTE — Progress Notes (Signed)
Adult Psychoeducational Group Note  Date:  05/12/2015 Time:  9:28 PM  Group Topic/Focus:  wrap-up group  Participation Level:  Active  Participation Quality:  Appropriate  Affect:  Appropriate  Cognitive:  Alert  Insight: Appropriate  Engagement in Group:  Engaged  Modes of Intervention:  Discussion  Additional Comments:  Pt stated that today was a good day. She does not have a goal for tomorrow.   Wynelle Fanny R 05/12/2015, 9:28 PM

## 2015-05-12 NOTE — BHH Group Notes (Signed)
[place] LCSW Group Therapy  05/12/2015   9 AM  Type of Therapy:  Group Therapy  Participation Level:  Did Not Attend; CSW was unable to locate patient   Summary of Progress/Problems: The main focus of today's process group was to identify the patient's current support system and decide on other supports that can be put in place. An emphasis was placed on using counselor, doctor, therapy groups, 12-step groups, and problem-specific support groups to expand supports. There was also an extensive discussion about what constitutes a healthy support versus an unhealthy support.  Sheilah Pigeon, LCSW

## 2015-05-12 NOTE — Progress Notes (Signed)
Patient ID: [name], female   DOB: 1990-12-14, 25 y.o.   MRN: [id number] D: Client visible on the unit, in dayroom watching TV. Client denies depression, says she talked to BF today. Client denies SHI. Client pleasant, smiles during interaction. A: Writer encouraged client to continue medications and reports any concerns to staff. Medications reviewed, administered as ordered. Staff will monitor q14min for safety. R: Client is safe on the unit, attended group.

## 2015-05-12 NOTE — Progress Notes (Signed)
[name] is status quo today, sleeping late this morning. [name] admits to this writer that [name] is " tired like I always am when I take my medicaiton" but is compliant with her meds and takes them. [name] completed her daily assessment and on it [name] wrote [name] denied SI and [name] rated her depression, hopelessness and anxiety " 3/0/3", respectively. A [name] took an active role in her LIfe SKills group today and was able to share personal feelings about the pain and discomfort of feeling " different"...that dealing with mental illness has caused her.  R CBG's are monitored, insulin administered per MD order and safety in place.

## 2015-05-12 NOTE — Progress Notes (Signed)
[facility] MD Progress Note  05/12/2015 11:21 AM [name]  MRN:  [id number]   Subjective: Patient states "I am really sleepy today, I would just like to rest."  Objective: [name] is awake, alert and oriented X4 , found walking the halls.  Denies suicidal or homicidal ideation. Denies auditory or visual hallucination and does not appear to be responding to internal stimuli. Patient reports interacting well with staff and others. Patient reports she is medication compliant without mediation side effects. Patient reports she is not attending group sessions today due to she is to tired" Reports she slept last night however she is still sleepy. States her depression 5/10. Patient Reports good appetite and is resting well. Support, encouragement and reassurance was provided.   Principal Problem: MDD (major depressive disorder) ([place]) Diagnosis:   Patient Active Problem List   Diagnosis Date Noted  . Recurrent major depressive disorder ([place]) [F33.9]   . Bipolar 1 disorder, depressed, severe ([place]) [F31.4] 05/09/2015  . MDD (major depressive disorder) ([place]) [F32.9] 02/20/2015  . Suicidal ideation [R45.851]   . Severe recurrent major depressive disorder with psychotic features ([place]) [F33.3]   . Recurrent major depression-severe ([place]) [F33.2] 12/22/2014  . Drug overdose [T50.901A]   . Cognitive deficits [R41.89] 10/12/2012  . Generalized anxiety disorder [F41.1] 06/28/2012   Total Time spent with patient: 20 minutes    Past Medical History:  Past Medical History  Diagnosis Date  . Anxiety   . Mental disorder   . Depression   . Cognitive deficits   . Bipolar 1 disorder ([place])   . Diabetes mellitus without complication [facility])     Past Surgical History  Procedure Laterality Date  . Cesarean section    . Tonsillectomy N/A 06/03/2012    Procedure: TONSILLECTOMY;  Surgeon: [name], MD;  Location: [place];  Service: ENT;  Laterality: N/A;  . Mass excision N/A  06/03/2012    Procedure: EXCISION MASS;  Surgeon: [name], MD;  Location: [place];  Service: ENT;  Laterality: N/A;  Excision uvula mass  . Cesarean section N/A 04/25/2013    Procedure: REPEAT CESAREAN SECTION;  Surgeon: [name], MD;  Location: [facility];  Service: Obstetrics;  Laterality: N/A;  . Tonsillectomy     Family History:  Family History  Problem Relation Age of Onset  . Hypertension Mother   . Diabetes Father     Social History:  History  Alcohol Use No     History  Drug Use No    Comment: Patient denies    Social History   Social History  . Marital Status: Single    Spouse Name: N/A  . Number of Children: N/A  . Years of Education: N/A   Social History Main Topics  . Smoking status: Current Every Day Smoker -- 0.50 packs/day for 7 years    Types: Cigarettes  . Smokeless tobacco: Never Used  . Alcohol Use: No  . Drug Use: No     Comment: Patient denies  . Sexual Activity: No   Other Topics Concern  . None   Social History Narrative   Additional Social History:   Sleep: Good  Appetite:  Good  Current Medications: Current Facility-Administered Medications  Medication Dose Route Frequency Provider Last Rate Last Dose  . acetaminophen (TYLENOL) tablet 650 mg  650 mg Oral Q6H PRN Laverle Hobby, PA-C      . alum & mag hydroxide-simeth (MAALOX/MYLANTA) 200-200-20 MG/5ML suspension 30 mL  30  mL Oral Q4H PRN Laverle Hobby, PA-C      . busPIRone (BUSPAR) tablet 15 mg  15 mg Oral BID Jenne Campus, MD   15 mg at 05/11/15 1716  . DULoxetine (CYMBALTA) DR capsule 60 mg  60 mg Oral BID Jenne Campus, MD   60 mg at 05/11/15 1717  . fluticasone (FLONASE) 50 MCG/ACT nasal spray 2 spray  2 spray Each Nare Daily Laverle Hobby, PA-C   2 spray at 05/11/15 0857  . insulin aspart (novoLOG) injection 0-20 Units  0-20 Units Subcutaneous TID WC Laverle Hobby, PA-C   4 Units at 05/11/15 1723  . insulin aspart (novoLOG) injection 0-5  Units  0-5 Units Subcutaneous QHS Laverle Hobby, PA-C   2 Units at 05/11/15 2135  . loratadine (CLARITIN) tablet 10 mg  10 mg Oral Daily Laverle Hobby, PA-C   10 mg at 05/11/15 0856  . LORazepam (ATIVAN) tablet 0.5 mg  0.5 mg Oral Q6H PRN Jenne Campus, MD   0.5 mg at 05/11/15 0901  . magnesium hydroxide (MILK OF MAGNESIA) suspension 30 mL  30 mL Oral Daily PRN Laverle Hobby, PA-C      . metFORMIN (GLUCOPHAGE) tablet 500 mg  500 mg Oral BID Laverle Hobby, PA-C   500 mg at 05/11/15 1717  . nicotine (NICODERM CQ - dosed in mg/24 hours) patch 21 mg  21 mg Transdermal Daily Jenne Campus, MD   21 mg at 05/11/15 0857  . omega-3 acid ethyl esters (LOVAZA) capsule 1 g  1 g Oral BID Derrill Center, NP   1 g at 05/11/15 1717  . traZODone (DESYREL) tablet 50 mg  50 mg Oral QHS PRN Jenne Campus, MD        Lab Results:  Results for orders placed or performed during the hospital encounter of 05/09/15 (from the past 48 hour(s))  Glucose, capillary     Status: Abnormal   Collection Time: 05/10/15 11:52 AM  Result Value Ref Range   Glucose-Capillary 141 (H) 65 - 99 mg/dL  Glucose, capillary     Status: Abnormal   Collection Time: 05/10/15  5:07 PM  Result Value Ref Range   Glucose-Capillary 144 (H) 65 - 99 mg/dL   Comment 1 Notify RN    Comment 2 Document in Chart   Glucose, capillary     Status: Abnormal   Collection Time: 05/10/15  8:28 PM  Result Value Ref Range   Glucose-Capillary 153 (H) 65 - 99 mg/dL  TSH     Status: None   Collection Time: 05/11/15  5:00 AM  Result Value Ref Range   TSH 1.353 0.350 - 4.500 uIU/mL    Comment: Performed at [facility]  Glucose, capillary     Status: Abnormal   Collection Time: 05/11/15  6:04 AM  Result Value Ref Range   Glucose-Capillary 136 (H) 65 - 99 mg/dL  Lipid panel     Status: Abnormal   Collection Time: 05/11/15  7:03 AM  Result Value Ref Range   Cholesterol 227 (H) 0 - 200 mg/dL   Triglycerides 289 (H) <150  mg/dL   HDL 53 >40 mg/dL   Total CHOL/HDL Ratio 4.3 RATIO   VLDL 58 (H) 0 - 40 mg/dL   LDL Cholesterol 116 (H) 0 - 99 mg/dL    Comment:        Total Cholesterol/HDL:CHD Risk Coronary Heart Disease Risk Table  Men   Women  1/2 Average Risk   3.4   3.3  Average Risk       5.0   4.4  2 X Average Risk   9.6   7.1  3 X Average Risk  23.4   11.0        Use the calculated Patient Ratio above and the CHD Risk Table to determine the patient's CHD Risk.        ATP III CLASSIFICATION (LDL):  <100     mg/dL   Optimal  100-129  mg/dL   Near or Above                    Optimal  130-159  mg/dL   Borderline  160-189  mg/dL   High  >190     mg/dL   Very High Performed at [facility]   Prolactin     Status: Abnormal   Collection Time: 05/11/15  7:03 AM  Result Value Ref Range   Prolactin 42.0 (H) 4.8 - 23.3 ng/mL    Comment: (NOTE) Performed At: [facility] [place], [place] [id number] Lindon Romp MD [id number] Performed at [facility]   Glucose, capillary     Status: Abnormal   Collection Time: 05/11/15 12:07 PM  Result Value Ref Range   Glucose-Capillary 226 (H) 65 - 99 mg/dL  Glucose, capillary     Status: Abnormal   Collection Time: 05/11/15  4:46 PM  Result Value Ref Range   Glucose-Capillary 188 (H) 65 - 99 mg/dL  Glucose, capillary     Status: Abnormal   Collection Time: 05/11/15  8:47 PM  Result Value Ref Range   Glucose-Capillary 220 (H) 65 - 99 mg/dL  Glucose, capillary     Status: Abnormal   Collection Time: 05/12/15  6:06 AM  Result Value Ref Range   Glucose-Capillary 137 (H) 65 - 99 mg/dL   Comment 1 Notify RN    Comment 2 Document in Chart     Physical Findings: AIMS: Facial and Oral Movements Muscles of Facial Expression: None, normal Lips and Perioral Area: None, normal Jaw: None, normal Tongue: None, normal,Extremity Movements Upper (arms, wrists, hands, fingers): None,  normal Lower (legs, knees, ankles, toes): None, normal, Trunk Movements Neck, shoulders, hips: None, normal, Overall Severity Severity of abnormal movements (highest score from questions above): None, normal Incapacitation due to abnormal movements: None, normal Patient's awareness of abnormal movements (rate only patient's report): No Awareness, Dental Status Current problems with teeth and/or dentures?: No Does patient usually wear dentures?: No  CIWA:    COWS:     Musculoskeletal: Strength & Muscle Tone: within normal limits Gait & Station: normal Patient leans: N/A  Psychiatric Specialty Exam: Review of Systems  Constitutional: Negative.   HENT: Negative.   Respiratory: Negative.   Cardiovascular: Negative.   Gastrointestinal: Negative.   Genitourinary: Negative.   Skin: Negative.   Neurological: Negative.   Endo/Heme/Allergies: Negative.   Psychiatric/Behavioral: Positive for depression. Negative for suicidal ideas and hallucinations. The patient is nervous/anxious.   All other systems reviewed and are negative.  no chest pain , no shortness of breath  Blood pressure 128/96, pulse 124, temperature 98.3 F (36.8 C), temperature source Oral, resp. rate 20, height 5' 5.5" (1.664 m), weight 124.286 kg (274 lb).Body mass index is 44.89 kg/(m^2).  General Appearance: causal and guarded. pleasant, clam and cooperative  Eye Contact::  Good  Speech:  Normal Rate  Volume:  Decreased  Mood:  Anxious and Depressed  Affect:  anxious   Thought Process:  Linear  Orientation:  Other:  fully alert and attentive   Thought Content:  Rumination  Suicidal Thoughts:  No- today denies any suicidal ideations, denies any self injurious ideations at this time  Homicidal Thoughts:  No  Memory:  recent and remote grossly intact   Judgement:  Fair  Insight:  Fair  Psychomotor Activity:  Restlessness  Concentration:  Good  Recall:  Good  Fund of Knowledge:Good  Language: Good  Akathisia:   Negative  Handed:  Right  AIMS (if indicated):     Assets:  Communication Skills Desire for Improvement Resilience  ADL's:  Intact  Cognition: WNL  Sleep:  Number of Hours: 6     I agree with current treatment plan on 05/12/2015, Patient seen face-to-face for psychiatric evaluation follow-up, chart reviewed. Advanced Practice Provider and Treatment team. Reviewed the information documented and agree with the treatment plan.   Treatment Plan Summary: Daily contact with patient to assess and evaluate symptoms and progress in treatment, Medication management, Plan inpatient admission and medications as below    Encourage milieu , group participation to work on coping skills and symptom reduction Continue Cymbalta 60 mgs BID for depression, anxiety Continue  Buspar to 15 mgs  BID for anxiety Continue Ativan 0.5 mgrs Q 6 hours PRN for anxiety as needed  Continue Trazodone 50 mgrs QHS PRN for insomnia as needed ContinueOmega-3 acid (Lavaza) PO BID for elevated Lipid levels Of note, patient states she did not get requested blood work done this AM, will reorder for PM.  Pending results for A1C,  Lipid panel elevated, TSH and Prolactin/WNL  Derrill Center FNP- [facility] 05/12/2015, 11:21 AM

## 2015-05-12 NOTE — Progress Notes (Signed)
Adult Psychoeducational Group Note  Date:  05/12/2015 Time:  8:00pm Group Topic/Focus:  Wrap-Up Group:   The focus of this group is to help patients review their daily goal of treatment and discuss progress on daily workbooks.  Participation Level:  Active  Participation Quality:  Appropriate and Attentive  Affect:  Appropriate  Cognitive:  Alert and Appropriate  Insight: Appropriate  Engagement in Group:  Engaged  Modes of Intervention:  Discussion  Additional Comments:  Pt was attentive and appropriate during tonights group discussion. Pt stated that today was a good day and she enjoyed the food and talking to her peers.   Theodoro Grist D 05/12/2015, 1:27 AM

## 2015-05-13 LAB — GLUCOSE, CAPILLARY
GLUCOSE-CAPILLARY: 172 mg/dL — AB (ref 65–99)
GLUCOSE-CAPILLARY: 172 mg/dL — AB (ref 65–99)
GLUCOSE-CAPILLARY: 234 mg/dL — AB (ref 65–99)
Glucose-Capillary: 153 mg/dL — ABNORMAL HIGH (ref 65–99)

## 2015-05-13 LAB — HEMOGLOBIN A1C
HEMOGLOBIN A1C: 7.4 % — AB (ref 4.8–5.6)
Mean Plasma Glucose: 166 mg/dL

## 2015-05-13 NOTE — Plan of Care (Signed)
Problem: Diagnosis: Increased Risk For Suicide Attempt Goal: STG-Patient Will Attend All Groups On The Unit Outcome: Progressing Pt attended evening wrap up group     

## 2015-05-13 NOTE — BHH Group Notes (Signed)
[place] LCSW Group Therapy 05/13/2015  1:15 pm  Type of Therapy: Group Therapy Participation Level: Active  Participation Quality: Attentive, Sharing and Supportive  Affect: Blunted  Cognitive: Alert and Oriented  Insight: Developing/Improving and Engaged  Engagement in Therapy: Developing/Improving and Engaged  Modes of Intervention: Clarification, Confrontation, Discussion, Education, Exploration,  Limit-setting, Orientation, Problem-solving, Rapport Building, Art therapist, Socialization and Support  Summary of Progress/Problems: Pt identified obstacles faced currently and processed barriers involved in overcoming these obstacles. Pt identified steps necessary for overcoming these obstacles and explored motivation (internal and external) for facing these difficulties head on. Pt further identified one area of concern in their lives and chose a goal to focus on for today. Patient identified her unhealthy relationship and financial concerns as her obstacles. CSW and other group members provided patient with emotional support and encouragement.   Tilden Fossa, MSW, [place] Worker [facility] [phone]

## 2015-05-13 NOTE — Plan of Care (Signed)
Problem: Aggression Towards others,Towards Self, and or Destruction Goal: STG-Patient will comply with prescribed medication regimen (Patient will comply with prescribed medication regimen)  Outcome: Progressing Pt compliant with medication regime

## 2015-05-13 NOTE — Progress Notes (Signed)
Patient ID: [name], female   DOB: [birth year], 25 y.o.   MRN: [id number] [facility] MD Progress Note  05/13/2015 4:22 PM [name]  MRN:  [id number]   Subjective: Patient reports she is feeling better, less depressed. She states she is no longer interested in going to a group home after discharge and wants to return to live with mother. Reports relationship with mother as "OK"/ improved . At this time denies medication side effects.  Objective:  I have discussed case with treatment team and have met with patient. Patient reports overall improvement and at this time minimizes depression, states her mood is "OK". Chart notes indicate milieu participation is limited, and that she has continued to present with constricted affect at times. No disruptive or agitated behaviors on unit. At this time denies medication side effects. States she feels medications are helping.   Principal Problem: MDD (major depressive disorder) ([place]) Diagnosis:   Patient Active Problem List   Diagnosis Date Noted  . Recurrent major depressive disorder ([place]) [F33.9]   . Bipolar 1 disorder, depressed, severe ([place]) [F31.4] 05/09/2015  . MDD (major depressive disorder) ([place]) [F32.9] 02/20/2015  . Suicidal ideation [R45.851]   . 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 Recurrent major depression-severe ([place]) [F33.2] 12/22/2014  . Drug overdose [T50.901A]   . Cognitive deficits [R41.89] 10/12/2012  . Generalized anxiety disorder [F41.1] 06/28/2012   Total Time spent with patient: 20 minutes    Past Medical History:  Past Medical History  Diagnosis Date  . Anxiety   . Mental disorder   . Depression   . Cognitive deficits   . Bipolar 1 disorder ([place])   . Diabetes mellitus without complication [facility])     Past Surgical History  Procedure Laterality Date  . Cesarean section    . Tonsillectomy N/A 06/03/2012    Procedure: TONSILLECTOMY;  Surgeon: [name],  MD;  Location: [place];  Service: ENT;  Laterality: N/A;  . Mass excision N/A 06/03/2012    Procedure: EXCISION MASS;  Surgeon: [name], MD;  Location: [place];  Service: ENT;  Laterality: N/A;  Excision uvula mass  . Cesarean section N/A 04/25/2013    Procedure: REPEAT CESAREAN SECTION;  Surgeon: [name], MD;  Location: [place] [facility];  Service: Obstetrics;  Laterality: N/A;  . Tonsillectomy     Family History:  Family History  Problem Relation Age of Onset  . Hypertension Mother   . Diabetes Father     Social History:  History  Alcohol Use No     History  Drug Use No    Comment: Patient denies    Social History   Social History  . Marital Status: Single    Spouse Name: N/A  . Number of Children: N/A  . Years of Education: N/A   Social History Main Topics  . Smoking status: Current Every Day Smoker -- 0.50 packs/day for 7 years    Types: Cigarettes  . Smokeless tobacco: Never Used  . Alcohol Use: No  . Drug Use: No     Comment: Patient denies  . Sexual Activity: No   Other Topics Concern  . None   Social History Narrative   Additional Social History:   Sleep: Good  Appetite:  Good  Current Medications: Current Facility-Administered Medications  Medication Dose Route Frequency Provider Last Rate Last Dose  . acetaminophen (TYLENOL) tablet 650 mg  650 mg Oral Q6H PRN Maurine Minister  Simon, PA-C      . alum & mag hydroxide-simeth (MAALOX/MYLANTA) 200-200-20 MG/5ML suspension 30 mL  30 mL Oral Q4H PRN Laverle Hobby, PA-C      . busPIRone (BUSPAR) tablet 15 mg  15 mg Oral BID Jenne Campus, MD   15 mg at 05/13/15 0816  . DULoxetine (CYMBALTA) DR capsule 60 mg  60 mg Oral BID Jenne Campus, MD   60 mg at 05/13/15 0817  . fluticasone (FLONASE) 50 MCG/ACT nasal spray 2 spray  2 spray Each Nare Daily Laverle Hobby, PA-C   2 spray at 05/12/15 1152  . insulin aspart (novoLOG) injection 0-20 Units  0-20 Units Subcutaneous TID  WC Laverle Hobby, PA-C   4 Units at 05/13/15 1211  . insulin aspart (novoLOG) injection 0-5 Units  0-5 Units Subcutaneous QHS Laverle Hobby, PA-C   2 Units at 05/12/15 2124  . loratadine (CLARITIN) tablet 10 mg  10 mg Oral Daily Laverle Hobby, PA-C   10 mg at 05/13/15 0816  . LORazepam (ATIVAN) tablet 0.5 mg  0.5 mg Oral Q6H PRN Jenne Campus, MD   0.5 mg at 05/12/15 2122  . magnesium hydroxide (MILK OF MAGNESIA) suspension 30 mL  30 mL Oral Daily PRN Laverle Hobby, PA-C      . metFORMIN (GLUCOPHAGE) tablet 500 mg  500 mg Oral BID Laverle Hobby, PA-C   500 mg at 05/13/15 0816  . nicotine (NICODERM CQ - dosed in mg/24 hours) patch 21 mg  21 mg Transdermal Daily Jenne Campus, MD   21 mg at 05/12/15 1152  . omega-3 acid ethyl esters (LOVAZA) capsule 1 g  1 g Oral BID Derrill Center, NP   1 g at 05/13/15 0816  . traZODone (DESYREL) tablet 50 mg  50 mg Oral QHS PRN Jenne Campus, MD        Lab Results:  Results for orders placed or performed during the hospital encounter of 05/09/15 (from the past 48 hour(s))  Glucose, capillary     Status: Abnormal   Collection Time: 05/11/15  4:46 PM  Result Value Ref Range   Glucose-Capillary 188 (H) 65 - 99 mg/dL  Glucose, capillary     Status: Abnormal   Collection Time: 05/11/15  8:47 PM  Result Value Ref Range   Glucose-Capillary 220 (H) 65 - 99 mg/dL  Glucose, capillary     Status: Abnormal   Collection Time: 05/12/15  6:06 AM  Result Value Ref Range   Glucose-Capillary 137 (H) 65 - 99 mg/dL   Comment 1 Notify RN    Comment 2 Document in Chart   Glucose, capillary     Status: Abnormal   Collection Time: 05/12/15 11:50 AM  Result Value Ref Range   Glucose-Capillary 226 (H) 65 - 99 mg/dL   Comment 1 Notify RN    Comment 2 Document in Chart   Glucose, capillary     Status: Abnormal   Collection Time: 05/12/15  5:08 PM  Result Value Ref Range   Glucose-Capillary 206 (H) 65 - 99 mg/dL   Comment 1 Notify RN    Comment 2 Document  in Chart   Glucose, capillary     Status: Abnormal   Collection Time: 05/12/15  8:27 PM  Result Value Ref Range   Glucose-Capillary 244 (H) 65 - 99 mg/dL  Glucose, capillary     Status: Abnormal   Collection Time: 05/13/15  6:24 AM  Result Value  Ref Range   Glucose-Capillary 172 (H) 65 - 99 mg/dL    Physical Findings: AIMS: Facial and Oral Movements Muscles of Facial Expression: None, normal Lips and Perioral Area: None, normal Jaw: None, normal Tongue: None, normal,Extremity Movements Upper (arms, wrists, hands, fingers): None, normal Lower (legs, knees, ankles, toes): None, normal, Trunk Movements Neck, shoulders, hips: None, normal, Overall Severity Severity of abnormal movements (highest score from questions above): None, normal Incapacitation due to abnormal movements: None, normal Patient's awareness of abnormal movements (rate only patient's report): No Awareness, Dental Status Current problems with teeth and/or dentures?: No Does patient usually wear dentures?: No  CIWA:    COWS:     Musculoskeletal: Strength & Muscle Tone: within normal limits Gait & Station: normal Patient leans: N/A  Psychiatric Specialty Exam: Review of Systems  Constitutional: Negative.   HENT: Negative.   Respiratory: Negative.   Cardiovascular: Negative.   Gastrointestinal: Negative.   Genitourinary: Negative.   Skin: Negative.   Neurological: Negative.   Endo/Heme/Allergies: Negative.   Psychiatric/Behavioral: Positive for depression. Negative for suicidal ideas and hallucinations. The patient is nervous/anxious.   All other systems reviewed and are negative.  no chest pain , no shortness of breath, no nausea, no vomiting   Blood pressure 127/84, pulse 112, temperature 97.8 F (36.6 C), temperature source Oral, resp. rate 16, height 5' 5.5" (1.664 m), weight 274 lb (124.286 kg).Body mass index is 44.89 kg/(m^2).  General Appearance:  Improved grooming   Eye Contact::  Good  Speech:   Normal Rate  Volume:  Decreased  Mood:   States she is feeling better, today minimizes depression  Affect:   Less constricted, smiles at times appropriately  Thought Process:  Linear  Orientation:  Other:  fully alert and attentive   Thought Content:   Denies hallucinations, no delusions ,   Suicidal Thoughts:  No- today denies any suicidal ideations, denies any self injurious ideations at this time  Homicidal Thoughts:  No denies any violent or homicidal ideations towards anyone   Memory:  recent and remote grossly intact   Judgement:   Improving   Insight:  Fair  Psychomotor Activity:   Decreased   Concentration:  Good  Recall:  Good  Fund of Knowledge:Good  Language: Good  Akathisia:  Negative  Handed:  Right  AIMS (if indicated):     Assets:  Communication Skills Desire for Improvement Resilience  ADL's:  Intact  Cognition: WNL  Sleep:  Number of Hours: 6   Assessment - patient reports improved mood , and currently minimizes ongoing depression, although staff reports indicate affect has been constricted at times. Denies any suicidal ideations. Of note, had been focused on going to a Group Home after discharge, but is now stating she wants to return to mother's home after discharge. It appears relationship with mother improved at this time. Denies medication side effects.    Treatment Plan Summary: Daily contact with patient to assess and evaluate symptoms and progress in treatment, Medication management, Plan inpatient admission and medications as below    Encourage milieu , group participation to work on coping skills and symptom reduction Continue Cymbalta 60 mgs BID for depression, anxiety Continue  Buspar to 15 mgs  BID for anxiety Continue Ativan 0.5 mgrs Q 6 hours PRN for anxiety as needed  Continue Trazodone 50 mgrs QHS PRN for insomnia as needed ContinueOmega-3 acid (Lavaza) PO BID for elevated Lipid levels Consider discharge soon as she continues to improve .    ,  Felicita Gage , MD   05/13/2015, 4:22 PM

## 2015-05-13 NOTE — BHH Group Notes (Signed)
[place] LCSW Group Therapy 05/13/2015  1:15 PM   Type of Therapy: Group Therapy  Participation Level: Did Not Attend. Patient invited to participate but declined.   Tilden Fossa, MSW, [place] Worker [facility] [phone]

## 2015-05-13 NOTE — Progress Notes (Signed)
D: "who do I talk to about going to a group home?". Pt stated ongoing issues with her mother who she lives with and constant stress living with her. Pt mood and affect appeared depressed and flat. Pt reports she is tolerating medication well without any side effect. Pt denies SI/HI/AVH and pain. Pt attended and participated in evening wrap up group. Cooperative with assessment.    A: Met with pt 1:1. Medications administered as prescribed. Support and encouragement provided to attend groups and engage in milieu. Pt encouraged to talk to SW tomorrow about group home placement.   R: Patient remains safe and complaint with medications.

## 2015-05-13 NOTE — Progress Notes (Signed)
Patient ID: [name], female   DOB: Jun 15, 1990, 25 y.o.   MRN: [id number]  Pt currently presents with a flat affect and isolative behavior. Per self inventory, pt rates depression at a 1, hopelessness 1 and anxiety 3. Pt's daily goal is to "give myself shots" and they intend to do so by "pracitce." Pt reports good sleep, a good appetite, normal energy and good concentration. Pt reports being concerned about needing insulin post discharge but feels more confident about giving herself injections after practicing last night and today.   Pt provided with medications per providers orders. Pt's labs and vitals were monitored throughout the day. Pt supported emotionally and encouraged to express concerns and questions. Pt educated on medications.  Pt's safety ensured with 15 minute and environmental checks. Pt currently denies SI/HI and A/V hallucinations. Pt verbally agrees to seek staff if SI/HI or A/VH occurs and to consult with staff before acting on these thoughts. Pt affect brighter this afternoon. Will continue POC.

## 2015-05-13 NOTE — Progress Notes (Signed)
Recreation Therapy Notes  Date: 01.16.2017 Time: 9:30am Location: [address] Group Room  Group Topic: Stress Management  Goal Area(s) Addresses:  Patient will actively participate in stress management techniques presented during session.   Behavioral Response: Did not attend.   Laureen Ochs Juliah Scadden, LRT/CTRS        Harveer Sadler L 05/13/2015 11:54 AM

## 2015-05-13 NOTE — Tx Team (Signed)
Initial Interdisciplinary Treatment Plan   PATIENT STRESSORS: Marital or family conflict Medication change or noncompliance   PATIENT STRENGTHS: Ability for insight Communication skills General fund of knowledge Motivation for treatment/growth   PROBLEM LIST: Problem List/Patient Goals Date to be addressed Date deferred Reason deferred Estimated date of resolution  depression 05/12/2015     Risk for suicide 05/12/2015     Medication adjustment 05/12/2015     anxiety 05/12/2015     "I need my medication adjusted" 05/12/2015                              DISCHARGE CRITERIA:  Improved stabilization in mood, thinking, and/or behavior Need for constant or close observation no longer present Safe-care adequate arrangements made  PRELIMINARY DISCHARGE PLAN: Attend PHP/IOP Participate in family therapy Placement in alternative living arrangements  PATIENT/FAMIILY INVOLVEMENT: This treatment plan has been presented to and reviewed with the patient, [name],  The patient and family have been given the opportunity to ask questions and make suggestions.  JEHU-APPIAH, Shaily Librizzi K 05/13/2015, 1:26 AM

## 2015-05-13 NOTE — Progress Notes (Signed)
D: Pt appeared calm and cooperative isolating to room most of the evening. Pt mood and affect appeared depressed and flat. Pt reports she is tolerating medication well without any side effect. Pt denies SI/HI/AVH and pain. Pt attended and participated in evening wrap up group. Cooperative with assessment.   A: Met with pt 1:1. Medications administered as prescribed. Support and encouragement provided to attend groups and engage in milieu.  R: Patient remains safe and complaint with medications.

## 2015-05-13 NOTE — Progress Notes (Signed)
Adult Psychoeducational Group Note  Date:  05/13/2015 Time:  10:22 PM  Group Topic/Focus:  Wrap-Up Group:   The focus of this group is to help patients review their daily goal of treatment and discuss progress on daily workbooks.  Participation Level:  Active  Participation Quality:  Appropriate  Affect:  Appropriate  Cognitive:  Alert  Insight: Appropriate  Engagement in Group:  Engaged  Modes of Intervention:  Discussion  Additional Comments:  Pt stated that she had a good day. Her goal for today was to lower her blood sugar and she believes it went down a little bit. Her goal for tomorrow is to [facility] up with [facility].  Huel Cote 05/13/2015, 10:22 PM

## 2015-05-14 DIAGNOSIS — F332 Major depressive disorder, recurrent severe without psychotic features: Secondary | ICD-10-CM | POA: Insufficient documentation

## 2015-05-14 MED ORDER — BUSPIRONE HCL 15 MG PO TABS: 15.0000 mg | ORAL_TABLET | Freq: Two times a day (BID) | ORAL | Status: DC

## 2015-05-14 MED ORDER — DULOXETINE HCL 60 MG PO CPEP: 60.0000 mg | ORAL_CAPSULE | Freq: Two times a day (BID) | ORAL | Status: DC

## 2015-05-14 MED ORDER — TRAZODONE HCL 50 MG PO TABS: 50.0000 mg | ORAL_TABLET | Freq: Every evening | ORAL | Status: DC | PRN

## 2015-05-14 MED ORDER — CETIRIZINE HCL 10 MG PO TABS: 10.0000 mg | ORAL_TABLET | Freq: Every day | ORAL | Status: DC

## 2015-05-14 MED ORDER — METFORMIN HCL 500 MG PO TABS: 500.0000 mg | ORAL_TABLET | Freq: Two times a day (BID) | ORAL | Status: DC

## 2015-05-14 MED ORDER — NICOTINE 21 MG/24HR TD PT24: 21.0000 mg | MEDICATED_PATCH | Freq: Every day | TRANSDERMAL | Status: DC

## 2015-05-14 MED ORDER — FLUTICASONE PROPIONATE 50 MCG/ACT NA SUSP: 1.0000 | Freq: Every day | NASAL | Status: DC | PRN

## 2015-05-14 MED ORDER — OMEGA-3-ACID ETHYL ESTERS 1 G PO CAPS: 1.0000 g | ORAL_CAPSULE | Freq: Two times a day (BID) | ORAL | Status: DC

## 2015-05-14 NOTE — Progress Notes (Signed)
Recreation Therapy Notes  Animal-Assisted Activity (AAA) Program Checklist/Progress Notes Patient Eligibility Criteria Checklist & Daily Group note for Rec Tx Intervention  Date: 01.17.2017 Time: 2:45pm Location: [address]    AAA/T Program Assumption of Risk Form signed by Patient/ or Parent Legal Guardian yes  Patient is free of allergies or sever asthma yes  Patient reports no fear of animals yes  Patient reports no history of cruelty to animals yes  Patient understands his/her participation is voluntary yes  Patient washes hands before animal contact yes  Patient washes hands after animal contact yes  Behavioral Response: Appropriate  Education: Hand Washing, Appropriate Animal Interaction   Education Outcome: Acknowledges education.   Clinical Observations/Feedback: Patient interacted appropriately with therapy dog and peers during session.   Laureen Ochs Kebron Pulse, LRT/CTRS  Lane Hacker 05/14/2015 2:59 PM
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
## 2015-05-14 NOTE — [facility] Suicide Risk Assessment (Signed)
[place] INPATIENT:  Family/Significant Other Suicide Prevention Education  Suicide Prevention Education:  Patient Refusal for Family/Significant Other Suicide Prevention Education: The patient [name] has refused to provide written consent for family/significant other to be provided Family/Significant Other Suicide Prevention Education during admission and/or prior to discharge.  Physician notified. SPE reviewed with patient and brochure provided. Patient encouraged to return to hospital if having suicidal thoughts, patient verbalized his/her understanding and has no further questions at this time.   Sheppard Luckenbach, Casimiro Needle 05/14/2015, 10:07 AM

## 2015-05-14 NOTE — Tx Team (Signed)
Interdisciplinary Treatment Plan Update (Adult) Date: 05/14/2015    Time Reviewed: 9:30 AM  Progress in Treatment: Attending groups: Minimally Participating in groups: Minimally Taking medication as prescribed: Yes Tolerating medication: Yes Family/Significant other contact made: No, patient has declined for collateral contact Patient understands diagnosis: Yes Discussing patient identified problems/goals with staff: Yes Medical problems stabilized or resolved: Yes Denies suicidal/homicidal ideation: Yes Issues/concerns per patient self-inventory: Yes Other:  New problem(s) identified: N/A  Discharge Plan or Barriers: Patient plans to return home to follow up with [facility].  Reason for Continuation of Hospitalization:  Depression Anxiety Medication Stabilization   Comments: N/A  Estimated length of stay: Discharge anticipated for today 05/14/15    Patient is a 24 year old female admitted for depression and SI with plan to OD on medication. Patient will benefit from crisis stabilization, medication evaluation, group therapy and psycho education in addition to case management for discharge planning. At discharge, it is recommended that Pt remain compliant with established discharge plan and continued treatment.   Review of initial/current patient goals per problem list:  1. Goal(s): Patient will participate in aftercare plan   Met: Yes   Target date: 3-5 days post admission date   As evidenced by: Patient will participate within aftercare plan AEB aftercare provider and housing plan at discharge being identified.  1/12: Goal not met: CSW assessing for appropriate referrals for pt and will have follow up secured prior to d/c.  1/17: Goal met. Patient plans to return home to follow up with [facility].    2. Goal (s): Patient will exhibit decreased depressive symptoms and suicidal ideations.   Met: Adequate for discharge  per MD   Target date: 3-5 days post admission date   As evidenced by: Patient will utilize self rating of depression at 3 or below and demonstrate decreased signs of depression or be deemed stable for discharge by MD.  1/12: Goal not met: Pt presents with flat affect and depressed mood.  Pt admitted with depression rating of 10.  Pt to show decreased sign of depression and a rating of 3 or less before d/c.    1/17: Adequate for discharge. Patient reports baseline levels of depression and denies SI.      Attendees: Patient:    Family:    Physician: Dr. Cobos; Dr. Lugo 05/14/2015 9:30 AM  Nursing:  Beverly Knight, Ronni Waller, RN 05/14/2015 9:30 AM  Clinical Social Worker:  , LCSWA 05/14/2015 9:30 AM  Other: Lauren Carter, LCSWA; Heather Smart, LCSW  05/14/2015 9:30 AM  Other:  05/14/2015 9:30 AM  Other: Jennifer Clark, Case Manager 05/14/2015 9:30 AM  Other: Aggie Nwoko, Conrad Withrow, NP 05/14/2015 9:30 AM  Other:      Scribe for Treatment Team:   , MSW, LCSWA [phone]     

## 2015-05-14 NOTE — Progress Notes (Addendum)
  [facility] Adult Case Management Discharge Plan :  Will you be returning to the same living situation after discharge:  Yes,  patient plans to return home at discharge At discharge, do you have transportation home?: taxi voucher Do you have the ability to pay for your medications: Yes,  patient will be provided with prescriptions at discharge  Release of information consent forms completed and in the chart;  Patient's signature needed at discharge.  Patient to Follow up at: Follow-up Information    Follow up with [facility].   Why:  call at discharge to resume services with your community support team.    Contact information:   [address] [place] [address] [phone]      Next level of care provider has access to [place] and Suicide Prevention discussed: Yes,  with patient  Have you used any form of tobacco in the last 30 days? (Cigarettes, Smokeless Tobacco, Cigars, and/or Pipes): Yes  Has patient been referred to the Quitline?: Patient refused referral  Patient has been referred for addiction treatment: N/A  Evella Kasal, Casimiro Needle 05/14/2015, 10:15 AM

## 2015-05-14 NOTE — [facility] Suicide Risk Assessment (Addendum)
[facility] Discharge Suicide Risk Assessment   Demographic Factors:  25 year old female, single, has three children ( patient reports DSS has custody), living with mother, on disability  Total Time spent with patient: 30 minutes  Musculoskeletal: Strength & Muscle Tone: within normal limits Gait & Station: normal Patient leans: N/A  Psychiatric Specialty Exam: Physical Exam  ROS  Blood pressure 137/97, pulse 74, temperature 98.8 F (37.1 C), temperature source Oral, resp. rate 16, height 5' 5.5" (1.664 m), weight 274 lb (124.286 kg).Body mass index is 44.89 kg/(m^2).  General Appearance: improved grooming   Eye Contact::  Good  Speech:  Normal Rate409  Volume:  Normal  Mood:  states mood is " a lot better now", and presents euthymic  Affect:  Appropriate and reactive   Thought Process:  Linear  Orientation:  Other:  fully alert and attentive   Thought Content:  denies any hallucinations, no delusions   Suicidal Thoughts:  No- denies any suicidal or homicidal ideations   Homicidal Thoughts:  No  Memory:  recent and remote grossly intact   Judgement:  Other:  improved   Insight:  Fair  Psychomotor Activity:  Normal  Concentration:  Good  Recall:  Good  Fund of Knowledge:Good  Language: Good  Akathisia:  Negative  Handed:  Right  AIMS (if indicated):     Assets:  Desire for Improvement Resilience  Sleep:  Number of Hours: 6.5  Cognition: WNL  ADL's:  Intact   Have you used any form of tobacco in the last 30 days? (Cigarettes, Smokeless Tobacco, Cigars, and/or Pipes): Yes  Has this patient used any form of tobacco in the last 30 days? (Cigarettes, Smokeless Tobacco, Cigars, and/or Pipes) Yes, A prescription for an FDA-approved tobacco cessation medication was offered at discharge and the patient refused  Mental Status Per Nursing Assessment::   On Admission:  Suicidal ideation indicated by patient, Suicide plan  Current Mental Status by Physician: Alert and attentive, well  related, improved grooming compared to admission, mood is improved and today seems euthymic, with a pleasant and reactive affect, no thought disorder, denies any suicidal ideations or any homicidal ideations, no hallucinations.  Loss Factors: Strained relationship with  Mother- of note patient states that since she has been in the hospital she has spoken with her mother and that their relationship is now much improved, disability   Historical Factors: History of depression, history of suicide attempt by overdose   Risk Reduction Factors:   Sense of responsibility to family, Living with another person, especially a relative and Positive coping skills or problem solving skills  Continued Clinical Symptoms:  As noted, currently improved compared to admission , at this time euthymic, with full range of affect, no SI or HI .   Cognitive Features That Contribute To Risk:  No gross cognitive deficits noted upon discharge. Is alert , attentive.   Suicide Risk:  Mild:  Suicidal ideation of limited frequency, intensity, duration, and specificity.  There are no identifiable plans, no associated intent, mild dysphoria and related symptoms, good self-control (both objective and subjective assessment), few other risk factors, and identifiable protective factors, including available and accessible social support.  Principal Problem: MDD (major depressive disorder) [facility]) Discharge Diagnoses:  Patient Active Problem List   Diagnosis Date Noted  . Recurrent major depressive disorder ([place]) [F33.9]   . Bipolar 1 disorder, depressed, severe ([place]) [F31.4] 05/09/2015  . MDD (major depressive disorder) ([place]) [F32.9] 02/20/2015  . Suicidal ideation [R45.851]   .  Severe recurrent major depressive disorder with psychotic features ([place]) [F33.3]   . Recurrent major depression-severe ([place]) [F33.2] 12/22/2014  . Drug overdose [T50.901A]   . Cognitive deficits [R41.89] 10/12/2012  . Generalized anxiety disorder  [F41.1] 06/28/2012    Follow-up Information    Follow up with [facility].   Why:  call at discharge to resume services with your community support team.    Contact information:   [address] [address] [phone]      Plan Of Care/Follow-up recommendations:  Activity:  as tolerated Diet:  Heart Healthy, diabetic diet  Tests:  NA Other:  See below  Is patient on multiple antipsychotic therapies at discharge:  No   Has Patient had three or more failed trials of antipsychotic monotherapy by history:  No  Recommended Plan for Multiple Antipsychotic Therapies: NA  At this time patient is discharging from unit in good spirits . Plans to return home , to live with mother. Plans to follow up as above Plans to follow up her PCP, at [facility], for medical management, DM/ hypertriglyceridemias  treatment as needed   COBOS, FERNANDO 05/14/2015, 11:58 AM

## 2015-05-15 LAB — GLUCOSE, CAPILLARY
GLUCOSE-CAPILLARY: 171 mg/dL — AB (ref 65–99)
GLUCOSE-CAPILLARY: 221 mg/dL — AB (ref 65–99)

## 2015-05-29 ENCOUNTER — Emergency Department (HOSPITAL_COMMUNITY)
Admission: EM | Admit: 2015-05-29 | Discharge: 2015-05-29 | Disposition: A | Discharge status: Home / Self Care | Payer: Medicaid Other | Attending: Emergency Medicine | Admitting: Emergency Medicine

## 2015-05-29 ENCOUNTER — Encounter (HOSPITAL_COMMUNITY): Payer: Self-pay | Admitting: *Deleted

## 2015-05-29 DIAGNOSIS — F17210 Nicotine dependence, cigarettes, uncomplicated: Secondary | ICD-10-CM | POA: Insufficient documentation

## 2015-05-29 DIAGNOSIS — Z3202 Encounter for pregnancy test, result negative: Secondary | ICD-10-CM | POA: Diagnosis not present

## 2015-05-29 DIAGNOSIS — R41840 Attention and concentration deficit: Secondary | ICD-10-CM | POA: Insufficient documentation

## 2015-05-29 DIAGNOSIS — F319 Bipolar disorder, unspecified: Secondary | ICD-10-CM | POA: Insufficient documentation

## 2015-05-29 DIAGNOSIS — F419 Anxiety disorder, unspecified: Secondary | ICD-10-CM | POA: Diagnosis not present

## 2015-05-29 DIAGNOSIS — Z79899 Other long term (current) drug therapy: Secondary | ICD-10-CM | POA: Insufficient documentation

## 2015-05-29 DIAGNOSIS — R531 Weakness: Secondary | ICD-10-CM | POA: Diagnosis present

## 2015-05-29 DIAGNOSIS — Z880 Allergy status to penicillin: Secondary | ICD-10-CM | POA: Insufficient documentation

## 2015-05-29 DIAGNOSIS — Z7984 Long term (current) use of oral hypoglycemic drugs: Secondary | ICD-10-CM | POA: Diagnosis not present

## 2015-05-29 DIAGNOSIS — E119 Type 2 diabetes mellitus without complications: Secondary | ICD-10-CM | POA: Diagnosis not present

## 2015-05-29 LAB — ACETAMINOPHEN LEVEL

## 2015-05-29 LAB — CBC
HCT: 41.1 % (ref 36.0–46.0)
Hemoglobin: 13.3 g/dL (ref 12.0–15.0)
MCH: 26.3 pg (ref 26.0–34.0)
MCHC: 32.4 g/dL (ref 30.0–36.0)
MCV: 81.4 fL (ref 78.0–100.0)
PLATELETS: 256 10*3/uL (ref 150–400)
RBC: 5.05 MIL/uL (ref 3.87–5.11)
RDW: 14.2 % (ref 11.5–15.5)
WBC: 11.7 10*3/uL — AB (ref 4.0–10.5)

## 2015-05-29 LAB — COMPREHENSIVE METABOLIC PANEL
ALBUMIN: 4.6 g/dL (ref 3.5–5.0)
ALT: 37 U/L (ref 14–54)
AST: 48 U/L — AB (ref 15–41)
Alkaline Phosphatase: 58 U/L (ref 38–126)
Anion gap: 11 (ref 5–15)
BUN: 12 mg/dL (ref 6–20)
CHLORIDE: 106 mmol/L (ref 101–111)
CO2: 26 mmol/L (ref 22–32)
Calcium: 10 mg/dL (ref 8.9–10.3)
Creatinine, Ser: 0.89 mg/dL (ref 0.44–1.00)
GFR calc Af Amer: >60 mL/min (ref >60)
GLUCOSE: 86 mg/dL (ref 65–99)
POTASSIUM: 3.5 mmol/L (ref 3.5–5.1)
SODIUM: 143 mmol/L (ref 135–145)
Total Bilirubin: 0.5 mg/dL (ref 0.3–1.2)
Total Protein: 8.1 g/dL (ref 6.5–8.1)

## 2015-05-29 LAB — CK TOTAL AND CKMB (NOT AT ARMC)
CK, MB: 2.7 ng/mL (ref 0.5–5.0)
RELATIVE INDEX: 0.7 (ref 0.0–2.5)
Total CK: 387 U/L — ABNORMAL HIGH (ref 38–234)

## 2015-05-29 LAB — SALICYLATE LEVEL

## 2015-05-29 LAB — ETHANOL

## 2015-05-29 LAB — POC URINE PREG, ED: Preg Test, Ur: NEGATIVE

## 2015-05-29 NOTE — Discharge Instructions (Signed)
As discussed, your evaluation today has been largely reassuring.  There is some evidence for an early infection.  Please drink plenty of fluids, monitor your condition carefully, and be sure to follow-up with your primary care physician.

## 2015-05-29 NOTE — ED Notes (Signed)
Per [facility] pt from home co gl weakness, nausea, diarrhea x 1 day. Feeling "not right " started 40 min ago. Pt sts increase in Cymbalta and Wellbutrin dosage 2 weeks ago . Pt denies abd pain.

## 2015-05-29 NOTE — ED Notes (Signed)
Per [facility] pt from home with c/o weakness. Pt had increase in wellbutrin, buspar, and cymbalta 2 weeks ago. For the past 40 min pt c/o tiredness, weakness. Diarrhea for past few days. Pt reports some difficulty in speaking. No facial droop. Mother reported past few days not acting right, not sleeping as well.

## 2015-05-29 NOTE — ED Provider Notes (Signed)
CSN: [id number]     Arrival date & time 05/29/15  1813 History   First MD Initiated Contact with Patient 05/29/15 [place]     Chief Complaint  Patient presents with  . Weakness     (Consider location/radiation/quality/duration/timing/severity/associated sxs/prior Treatment) HPI Patient presents after episodic weakness. Patient was at home, in her usual state of health, when she had the relatively sudden onset of generalized weakness. No syncope, no nausea, no vomiting, no change in cognition. Symptoms lasted for about 40 minutes, and on arrival the patient states that she is feeling better, though not completely back to normal. She acknowledges multiple medical issues, including depression. She had recent changes in medication, including BuSpar.  Past Medical History  Diagnosis Date  . Anxiety   . Mental disorder   . Depression   . Cognitive deficits   . Bipolar 1 disorder ([place])   . Diabetes mellitus without complication [facility])    Past Surgical History  Procedure Laterality Date  . Cesarean section    . Tonsillectomy N/A 06/03/2012    Procedure: TONSILLECTOMY;  Surgeon: [name], MD;  Location: [place];  Service: ENT;  Laterality: N/A;  . Mass excision N/A 06/03/2012    Procedure: EXCISION MASS;  Surgeon: [name], MD;  Location: [place];  Service: ENT;  Laterality: N/A;  Excision uvula mass  . Cesarean section N/A 04/25/2013    Procedure: REPEAT CESAREAN SECTION;  Surgeon: [name], MD;  Location: [facility];  Service: Obstetrics;  Laterality: N/A;  . Tonsillectomy     Family History  Problem Relation Age of Onset  . Hypertension Mother   . Diabetes Father    Social History  Substance Use Topics  . Smoking status: Current Every Day Smoker -- 0.50 packs/day for 7 years    Types: Cigarettes  . Smokeless tobacco: Never Used  . Alcohol Use: No   OB History    Gravida Para Term Preterm AB TAB SAB Ectopic Multiple Living   3 3  3  0 0 0 0 0 0 3     Review of Systems  Constitutional:       Per HPI, otherwise negative  HENT:       Per HPI, otherwise negative  Respiratory:       Per HPI, otherwise negative  Cardiovascular:       Per HPI, otherwise negative  Gastrointestinal: Negative for vomiting.  Endocrine:       Negative aside from HPI  Genitourinary:       Neg aside from HPI   Musculoskeletal:       Per HPI, otherwise negative  Skin: Negative.   Neurological: Positive for weakness. Negative for syncope.  Psychiatric/Behavioral: Positive for decreased concentration.      Allergies  Penicillins  Home Medications   Prior to Admission medications   Medication Sig Start Date End Date Taking? Authorizing Provider  busPIRone (BUSPAR) 15 MG tablet Take 1 tablet (15 mg total) by mouth 2 (two) times daily. For anxiety 05/14/15  Yes Encarnacion Slates, NP  cetirizine (ZYRTEC) 10 MG tablet Take 1 tablet (10 mg total) by mouth daily. For allergies 05/14/15  Yes Encarnacion Slates, NP  DULoxetine (CYMBALTA) 60 MG capsule Take 1 capsule (60 mg total) by mouth 2 (two) times daily. For depression 05/14/15  Yes Encarnacion Slates, NP  metFORMIN (GLUCOPHAGE) 500 MG tablet Take 1 tablet (500 mg total) by mouth 2 (two) times daily. For diabetes management 05/14/15  Yes  Encarnacion Slates, NP  nicotine (NICODERM CQ - DOSED IN MG/24 HOURS) 21 mg/24hr patch Place 1 patch (21 mg total) onto the skin daily. For smoking cessation 05/14/15  Yes Encarnacion Slates, NP  fluticasone (FLONASE) 50 MCG/ACT nasal spray Place 1 spray into both nostrils daily as needed. Allergies 05/14/15   Encarnacion Slates, NP  omega-3 acid ethyl esters (LOVAZA) 1 g capsule Take 1 capsule (1 g total) by mouth 2 (two) times daily. For high cholesterol Patient not taking: Reported on 05/29/2015 05/14/15   Encarnacion Slates, NP  traZODone (DESYREL) 50 MG tablet Take 1 tablet (50 mg total) by mouth at bedtime as needed for sleep. 05/14/15   Encarnacion Slates, NP   BP 144/95 mmHg  Pulse 111   Temp(Src) 97.8 F (36.6 C) (Oral)  Resp 18  Ht 5\' 7"  (1.702 m)  SpO2 99% Physical Exam  Constitutional: She is oriented to person, place, and time. She appears well-developed and well-nourished. No distress.  HENT:  Head: Normocephalic and atraumatic.  Eyes: Conjunctivae and EOM are normal.  Cardiovascular: Normal rate and regular rhythm.   Pulmonary/Chest: Effort normal and breath sounds normal. No stridor. No respiratory distress.  Abdominal: She exhibits no distension.  Musculoskeletal: She exhibits no edema.  Neurological: She is alert and oriented to person, place, and time. No cranial nerve deficit. She exhibits normal muscle tone. Coordination normal.  Skin: Skin is warm and dry.  Psychiatric: She has a normal mood and affect. Her behavior is normal. Thought content normal.  Slightly slow speech, otherwise unremarkable  Nursing note and vitals reviewed.   ED Course  Procedures (including critical care time) Labs Review Labs Reviewed  ACETAMINOPHEN LEVEL - Abnormal; Notable for the following:    Acetaminophen (Tylenol), Serum <10 (*)    All other components within normal limits  CBC - Abnormal; Notable for the following:    WBC 11.7 (*)    All other components within normal limits  COMPREHENSIVE METABOLIC PANEL - Abnormal; Notable for the following:    AST 48 (*)    All other components within normal limits  CK TOTAL AND CKMB (NOT AT [facility]) - Abnormal; Notable for the following:    Total CK 387 (*)    All other components within normal limits  ETHANOL  SALICYLATE LEVEL  DRUGS OF ABUSE SCREEN W ALC, ROUTINE URINE  POC URINE PREG, ED    Imaging Review No results found. I have personally reviewed and evaluated these images and lab results as part of my medical decision-making.   EKG Interpretation   Date/Time:  Wednesday May 29 2015 18:49:31 EST Ventricular Rate:  105 PR Interval:  158 QRS Duration: 90 QT Interval:  300 QTC Calculation: 396 R Axis:    87 Text Interpretation:  Sinus tachycardia Consider right atrial enlargement  Borderline Q waves in lateral leads Borderline repolarization abnormality  Sinus tachycardia Left ventricular hypertrophy T wave abnormality Abnormal  ekg Confirmed by Carmin Muskrat  MD [phone]) on 05/29/2015 7:11:57 PM     9:34 PM Patient sitting up in no distress, no complete, states she feels substantially better.  MDM  Patient presents after an episode of weakness. Here, the patient is awake, alert, in no distress, speaking clearly. She is hemodynamically stable, with mild leukocytosis, no evidence for substantial infection. Patient has no physical exam evidence consistent with pneumonia.  Patient may have mild viral illness versus early other infection. With no distress, and her improvement here she is discharged  in stable condition to follow-up with primary care.   Carmin Muskrat, MD 05/29/15 2135

## 2015-05-29 NOTE — ED Notes (Signed)
Pt left without signing or allowing for discharge vitals signs.

## 2015-05-31 LAB — URINE DRUGS OF ABUSE SCREEN W ALC, ROUTINE (REF LAB)
Amphetamines, Urine: NEGATIVE ng/mL
BARBITURATE, UR: NEGATIVE ng/mL
BENZODIAZEPINE QUANT UR: NEGATIVE ng/mL
CANNABINOID QUANT UR: NEGATIVE ng/mL
COCAINE (METAB.): NEGATIVE ng/mL
Ethanol U, Quan: NEGATIVE %
Methadone Screen, Urine: NEGATIVE ng/mL
OPIATE QUANT UR: NEGATIVE ng/mL
Phencyclidine, Ur: NEGATIVE ng/mL
Propoxyphene, Urine: NEGATIVE ng/mL

## 2015-06-04 ENCOUNTER — Encounter (HOSPITAL_COMMUNITY): Payer: Self-pay | Admitting: Emergency Medicine

## 2015-06-04 ENCOUNTER — Emergency Department (HOSPITAL_COMMUNITY)
Admission: EM | Admit: 2015-06-04 | Discharge: 2015-06-04 | Disposition: A | Discharge status: Home / Self Care | Payer: Medicaid Other | Attending: Emergency Medicine | Admitting: Emergency Medicine

## 2015-06-04 ENCOUNTER — Emergency Department (HOSPITAL_COMMUNITY): Payer: Medicaid Other

## 2015-06-04 DIAGNOSIS — F17210 Nicotine dependence, cigarettes, uncomplicated: Secondary | ICD-10-CM | POA: Insufficient documentation

## 2015-06-04 DIAGNOSIS — R479 Unspecified speech disturbances: Secondary | ICD-10-CM | POA: Diagnosis not present

## 2015-06-04 DIAGNOSIS — Z7984 Long term (current) use of oral hypoglycemic drugs: Secondary | ICD-10-CM | POA: Insufficient documentation

## 2015-06-04 DIAGNOSIS — R51 Headache: Secondary | ICD-10-CM | POA: Diagnosis present

## 2015-06-04 DIAGNOSIS — R531 Weakness: Secondary | ICD-10-CM | POA: Insufficient documentation

## 2015-06-04 DIAGNOSIS — R42 Dizziness and giddiness: Secondary | ICD-10-CM | POA: Insufficient documentation

## 2015-06-04 DIAGNOSIS — Z880 Allergy status to penicillin: Secondary | ICD-10-CM | POA: Insufficient documentation

## 2015-06-04 DIAGNOSIS — Z3202 Encounter for pregnancy test, result negative: Secondary | ICD-10-CM | POA: Diagnosis not present

## 2015-06-04 DIAGNOSIS — Z7951 Long term (current) use of inhaled steroids: Secondary | ICD-10-CM | POA: Diagnosis not present

## 2015-06-04 DIAGNOSIS — J3489 Other specified disorders of nose and nasal sinuses: Secondary | ICD-10-CM | POA: Insufficient documentation

## 2015-06-04 DIAGNOSIS — R519 Headache, unspecified: Secondary | ICD-10-CM

## 2015-06-04 DIAGNOSIS — E119 Type 2 diabetes mellitus without complications: Secondary | ICD-10-CM | POA: Diagnosis not present

## 2015-06-04 DIAGNOSIS — Z79899 Other long term (current) drug therapy: Secondary | ICD-10-CM | POA: Diagnosis not present

## 2015-06-04 DIAGNOSIS — R0981 Nasal congestion: Secondary | ICD-10-CM | POA: Diagnosis not present

## 2015-06-04 DIAGNOSIS — H538 Other visual disturbances: Secondary | ICD-10-CM | POA: Insufficient documentation

## 2015-06-04 DIAGNOSIS — R000 Tachycardia, unspecified: Secondary | ICD-10-CM | POA: Diagnosis not present

## 2015-06-04 LAB — URINALYSIS, ROUTINE W REFLEX MICROSCOPIC
BILIRUBIN URINE: NEGATIVE
GLUCOSE, UA: NEGATIVE mg/dL
Hgb urine dipstick: NEGATIVE
Ketones, ur: NEGATIVE mg/dL
Leukocytes, UA: NEGATIVE
NITRITE: NEGATIVE
PH: 5.5 (ref 5.0–8.0)
Protein, ur: 30 mg/dL — AB
SPECIFIC GRAVITY, URINE: 1.036 — AB (ref 1.005–1.030)

## 2015-06-04 LAB — CBC WITH DIFFERENTIAL/PLATELET
Basophils Absolute: 0.1 10*3/uL (ref 0.0–0.1)
Basophils Relative: 1 %
Eosinophils Absolute: 0.3 10*3/uL (ref 0.0–0.7)
Eosinophils Relative: 3 %
HEMATOCRIT: 40.2 % (ref 36.0–46.0)
HEMOGLOBIN: 12.9 g/dL (ref 12.0–15.0)
LYMPHS ABS: 3.9 10*3/uL (ref 0.7–4.0)
Lymphocytes Relative: 40 %
MCH: 26.3 pg (ref 26.0–34.0)
MCHC: 32.1 g/dL (ref 30.0–36.0)
MCV: 82 fL (ref 78.0–100.0)
MONO ABS: 0.7 10*3/uL (ref 0.1–1.0)
MONOS PCT: 7 %
NEUTROS ABS: 4.9 10*3/uL (ref 1.7–7.7)
NEUTROS PCT: 49 %
Platelets: 246 10*3/uL (ref 150–400)
RBC: 4.9 MIL/uL (ref 3.87–5.11)
RDW: 14.3 % (ref 11.5–15.5)
WBC: 9.9 10*3/uL (ref 4.0–10.5)

## 2015-06-04 LAB — BASIC METABOLIC PANEL
Anion gap: 9 (ref 5–15)
BUN: 9 mg/dL (ref 6–20)
CHLORIDE: 105 mmol/L (ref 101–111)
CO2: 28 mmol/L (ref 22–32)
CREATININE: 0.8 mg/dL (ref 0.44–1.00)
Calcium: 9.9 mg/dL (ref 8.9–10.3)
GFR calc Af Amer: >60 mL/min (ref >60)
GFR calc non Af Amer: >60 mL/min (ref >60)
GLUCOSE: 113 mg/dL — AB (ref 65–99)
Potassium: 3.9 mmol/L (ref 3.5–5.1)
Sodium: 142 mmol/L (ref 135–145)

## 2015-06-04 LAB — RAPID URINE DRUG SCREEN, HOSP PERFORMED
AMPHETAMINES: NOT DETECTED
BARBITURATES: NOT DETECTED
Benzodiazepines: NOT DETECTED
Cocaine: NOT DETECTED
OPIATES: NOT DETECTED
TETRAHYDROCANNABINOL: NOT DETECTED

## 2015-06-04 LAB — PREGNANCY, URINE: Preg Test, Ur: NEGATIVE

## 2015-06-04 LAB — URINE MICROSCOPIC-ADD ON
Bacteria, UA: NONE SEEN
WBC UA: NONE SEEN WBC/hpf (ref 0–5)

## 2015-06-04 MED ORDER — ACETAMINOPHEN 325 MG PO TABS
650.0000 mg | ORAL_TABLET | Freq: Once | ORAL | Status: AC
  Administered 2015-06-04: 650 mg via ORAL
  Filled 2015-06-04: qty 2

## 2015-06-04 NOTE — ED Notes (Signed)
Patient transported to CT 

## 2015-06-04 NOTE — Discharge Instructions (Signed)
Continue taking your home medication as prescribed. You may take Tylenol as prescribed over-the-counter as needed for pain relief. Follow-up with your primary care provider in 3-4 days for follow-up. Please return to the Emergency Department if symptoms worsen or new onset of fever, neck stiffness, visual changes, light sensitivity, abdominal pain, vomiting, urinary symptoms, numbness, tingling, weakness, seizures, syncope.

## 2015-06-04 NOTE — ED Notes (Signed)
Pt sts HA with some dizziness x several days; pt sts seen here for same in past

## 2015-06-04 NOTE — ED Provider Notes (Signed)
CSN: [id number]     Arrival date & time 06/04/15  1526 History  By signing my name below, I, [name], attest that this documentation has been prepared under the direction and in the presence of Stacy Ramus, PA-C. Electronically Signed: Eustaquio [name], ED Scribe. 06/04/2015. 6:17 PM.   Chief Complaint  Patient presents with  . Headache   The history is provided by the patient. No language interpreter was used.     HPI Comments: [name] is a 25 y.o. female with hx DM and cognitive deficits who presents to the Emergency Department complaining of gradual onset, intermittent, sharp, bilateral parietal headache that began today. Pt states that the headache is worsened by increased stress. She has never had a headache this severe in the past. She also mentions for the past 2 weeks she has had episodes of sudden onset, intermittent, lightheadedness that typically lasts 1-2 hours before subsiding on its own. Pt reports that when she has these lightheaded spells she also has blurry vision and generalized weakness. She states that the last time she had an episode was 4 days ago when she had difficulty speaking that lasted for a few minutes and then spontaneously resolved. Family members reports that pt was talking very slowly which is not her baseline. Pt states that she has been seen by her PCP and at [facility] ED for these same symptoms in the past. Pt does not have hx of seizures. She has never had previous imaging done of her head. She also complains of mild nasal congestion and rhinorrhea. Denies fever, neck stiffness, sore throat, shortness of breath, chest pain, cough, abdominal pain, nausea, vomiting, diarrhea, hematuria, hematochezia, vaginal bleeding, numbness, tingling, or any other associated symptoms. LNMP:1 year ago. Pt has nexplanon implant in and does not get periods with it.   Past Medical History  Diagnosis Date  . Anxiety   . Mental disorder   . Depression   . Cognitive  deficits   . Bipolar 1 disorder ([place])   . Diabetes mellitus without complication [facility])    Past Surgical History  Procedure Laterality Date  . Cesarean section    . Tonsillectomy N/A 06/03/2012    Procedure: TONSILLECTOMY;  Surgeon: [name], MD;  Location: [place];  Service: ENT;  Laterality: N/A;  . Mass excision N/A 06/03/2012    Procedure: EXCISION MASS;  Surgeon: [name], MD;  Location: [place];  Service: ENT;  Laterality: N/A;  Excision uvula mass  . Cesarean section N/A 04/25/2013    Procedure: REPEAT CESAREAN SECTION;  Surgeon: [name], MD;  Location: [facility];  Service: Obstetrics;  Laterality: N/A;  . Tonsillectomy     Family History  Problem Relation Age of Onset  . Hypertension Mother   . Diabetes Father    Social History  Substance Use Topics  . Smoking status: Current Every Day Smoker -- 0.50 packs/day for 7 years    Types: Cigarettes  . Smokeless tobacco: Never Used  . Alcohol Use: No   OB History    Gravida Para Term Preterm AB TAB SAB Ectopic Multiple Living   3 3 3  0 0 0 0 0 0 3     Review of Systems  Constitutional: Negative for fever.  HENT: Positive for congestion and rhinorrhea. Negative for sore throat.   Eyes: Positive for visual disturbance (Blurry vision).  Respiratory: Negative for cough and shortness of breath.   Cardiovascular: Negative for chest pain.  Gastrointestinal: Negative  for nausea, vomiting, abdominal pain, diarrhea and blood in stool.  Genitourinary: Negative for hematuria and vaginal bleeding.  Neurological: Positive for speech difficulty, weakness (Generalized), light-headedness and headaches. Negative for numbness.   Allergies  Penicillins  Home Medications   Prior to Admission medications   Medication Sig Start Date End Date Taking? Authorizing Provider  busPIRone (BUSPAR) 15 MG tablet Take 1 tablet (15 mg total) by mouth 2 (two) times daily. For anxiety 05/14/15  Yes Encarnacion Slates, NP  cetirizine (ZYRTEC) 10 MG tablet Take 1 tablet (10 mg total) by mouth daily. For allergies 05/14/15  Yes Encarnacion Slates, NP  DULoxetine (CYMBALTA) 60 MG capsule Take 1 capsule (60 mg total) by mouth 2 (two) times daily. For depression 05/14/15  Yes Encarnacion Slates, NP  fluticasone (FLONASE) 50 MCG/ACT nasal spray Place 1 spray into both nostrils daily as needed. Allergies 05/14/15  Yes Encarnacion Slates, NP  glipiZIDE (GLUCOTROL XL) 5 MG 24 hr tablet Take 5 mg by mouth daily with breakfast.   Yes Historical Provider, MD  metFORMIN (GLUCOPHAGE) 500 MG tablet Take 1 tablet (500 mg total) by mouth 2 (two) times daily. For diabetes management 05/14/15  Yes Encarnacion Slates, NP  nicotine (NICODERM CQ - DOSED IN MG/24 HOURS) 21 mg/24hr patch Place 1 patch (21 mg total) onto the skin daily. For smoking cessation 05/14/15  Yes Encarnacion Slates, NP  traZODone (DESYREL) 50 MG tablet Take 1 tablet (50 mg total) by mouth at bedtime as needed for sleep. 05/14/15  Yes Encarnacion Slates, NP   BP 144/88 mmHg  Pulse 108  Temp(Src) 98.3 F (36.8 C) (Oral)  Resp 18  SpO2 100%   Physical Exam  Constitutional: She is oriented to person, place, and time. She appears well-developed and well-nourished. No distress.  HENT:  Head: Normocephalic and atraumatic.  Right Ear: Tympanic membrane normal.  Left Ear: Tympanic membrane normal.  Nose: Nose normal. Right sinus exhibits no maxillary sinus tenderness and no frontal sinus tenderness. Left sinus exhibits no maxillary sinus tenderness and no frontal sinus tenderness.  Mouth/Throat: Uvula is midline, oropharynx is clear and moist and mucous membranes are normal. No oropharyngeal exudate, posterior oropharyngeal edema, posterior oropharyngeal erythema or tonsillar abscesses.  Eyes: Conjunctivae and EOM are normal. Pupils are equal, round, and reactive to light. Right eye exhibits no discharge. Left eye exhibits no discharge. No scleral icterus.  No nystagmus  Neck: Normal range  of motion. Neck supple. No tracheal deviation present.  No cervical lymphadenopathy  Cardiovascular: Regular rhythm, normal heart sounds and intact distal pulses.  Tachycardia present.   Heart rate 110 bpm  Pulmonary/Chest: Effort normal and breath sounds normal. No respiratory distress. She has no wheezes. She has no rales. She exhibits no tenderness.  Abdominal: Soft. Bowel sounds are normal. She exhibits no distension and no mass. There is no tenderness. There is no rebound and no guarding.  Musculoskeletal: Normal range of motion. She exhibits no edema.  Neurological: She is alert and oriented to person, place, and time. She has normal strength and normal reflexes. No cranial nerve deficit or sensory deficit. She displays a negative Romberg sign. Coordination and gait normal.  Skin: Skin is warm and dry.  Psychiatric: She has a normal mood and affect. Her behavior is normal.  Nursing note and vitals reviewed.   ED Course  Procedures (including critical care time)  DIAGNOSTIC STUDIES: Oxygen Saturation is 97% on RA, normal by my interpretation.  COORDINATION OF CARE: 6:10 PM-Discussed treatment plan which includes CT Head, CBC, BMP, UA, urine pregnancy, rapid drug screen with pt at bedside and pt agreed to plan.   9:15 PM - Upon reevaluation, pt states all of her symptoms have resolved and she is feeling better.   Labs Review Labs Reviewed  BASIC METABOLIC PANEL - Abnormal; Notable for the following:    Glucose, Bld 113 (*)    All other components within normal limits  URINALYSIS, ROUTINE W REFLEX MICROSCOPIC (NOT AT [facility]) - Abnormal; Notable for the following:    Color, Urine AMBER (*)    Specific Gravity, Urine 1.036 (*)    Protein, ur 30 (*)    All other components within normal limits  URINE MICROSCOPIC-ADD ON - Abnormal; Notable for the following:    Squamous Epithelial / LPF 0-5 (*)    Crystals CA OXALATE CRYSTALS (*)    All other components within normal limits  CBC  WITH DIFFERENTIAL/PLATELET  PREGNANCY, URINE  URINE RAPID DRUG SCREEN, HOSP PERFORMED    Imaging Review Ct Head Wo Contrast  06/04/2015  CLINICAL DATA:  Lightheadedness for the past 2 weeks. Headache last night. EXAM: CT HEAD WITHOUT CONTRAST TECHNIQUE: Contiguous axial images were obtained from the base of the skull through the vertex without intravenous contrast. COMPARISON:  Neck CTs dated 05/29/2013 and 12/18/2011. FINDINGS: Normal appearing cerebral hemispheres and posterior fossa structures. Normal size and position of the ventricles. No intracranial hemorrhage, mass lesion or CT evidence of acute infarction. Unremarkable bones. Interval right maxillary, bilateral ethmoid and bilateral frontal sinus mucosal thickening. Increased size of a large right maxillary sinus retention cysts. Small right sphenoid sinus retention cyst. IMPRESSION: 1. No intracranial abnormality. 2. Chronic bilateral frontal, bilateral ethmoid and right maxillary sinusitis. Electronically Signed   By: Claudie Revering M.D.   On: 06/04/2015 19:35   I have personally reviewed and evaluated these images and lab results as part of my medical decision-making.   EKG Interpretation   Date/Time:  Tuesday June 04 2015 19:28:04 EST Ventricular Rate:  110 PR Interval:  150 QRS Duration: 102 QT Interval:  336 QTC Calculation: 454 R Axis:   111 Text Interpretation:  Sinus tachycardia Possible Lateral infarct , age  undetermined Cannot rule out Inferior infarct , age undetermined Abnormal  ECG since last tracing no significant change Confirmed by Eulis Foster  MD,  ELLIOTT [phone]) on 06/04/2015 9:09:37 PM      MDM   Final diagnoses:  Nonintractable headache, unspecified chronicity pattern, unspecified headache type   Patient presents with headache. She also reports having multiple episodes of lightheadedness, blurred vision, weakness and slow speech that lasted for approximately 1-2 hours and then spontaneously resolve. She notes  her last episode was 4 days ago. VSS, HR 110. Exam unremarkable. No neuro deficits. Patient able to stand and ambulate without assistance, no ataxia noted.  EKG showed sinus tachycardia with possible lateral infarct, age undetermined, no changes since last tracing. Labs unremarkable. Pregnancy negative. UDS negative. CT head revealed chronic bilateral frontal bilateral ethmoid and right maxillary sinusitis, no intracranial abnormality. Negative orthostatics. Visual acuity unremarkable.  On reevaluation patient reports all of her symptoms have resolved and she is feeling better. Discussed case with Dr. Eulis Foster. Plan to discharge patient home with PCP follow-up. Discussed results and plan for discharge with patient. Advised patient to follow up with her PCP in 3-4 days.  Evaluation does not show pathology requring ongoing emergent intervention or admission. Pt is hemodynamically stable and mentating  appropriately. Discussed findings/results and plan with patient/guardian, who agrees with plan. All questions answered. Return precautions discussed and outpatient follow up given.    I personally performed the services described in this documentation, which was scribed in my presence. The recorded information has been reviewed and is accurate.      Chesley Noon [place], [place] 06/04/15 2145  Daleen Bo, MD 06/06/15 [phone]

## 2015-06-04 NOTE — ED Notes (Signed)
Patient is alert and orientedx4.  Patient was explained discharge instructions and they understood them with no questions.   

## 2015-06-19 ENCOUNTER — Encounter (HOSPITAL_COMMUNITY): Payer: Self-pay

## 2015-06-19 ENCOUNTER — Emergency Department (HOSPITAL_COMMUNITY)
Admission: EM | Admit: 2015-06-19 | Discharge: 2015-06-19 | Disposition: A | Discharge status: Home / Self Care | Payer: Medicaid Other | Attending: Emergency Medicine | Admitting: Emergency Medicine

## 2015-06-19 DIAGNOSIS — F419 Anxiety disorder, unspecified: Secondary | ICD-10-CM | POA: Insufficient documentation

## 2015-06-19 DIAGNOSIS — F17210 Nicotine dependence, cigarettes, uncomplicated: Secondary | ICD-10-CM | POA: Insufficient documentation

## 2015-06-19 DIAGNOSIS — R42 Dizziness and giddiness: Secondary | ICD-10-CM | POA: Diagnosis present

## 2015-06-19 DIAGNOSIS — E119 Type 2 diabetes mellitus without complications: Secondary | ICD-10-CM | POA: Diagnosis not present

## 2015-06-19 NOTE — ED Notes (Signed)
Pt called again from triage, no answer 

## 2015-06-19 NOTE — ED Notes (Signed)
Per EMS, pt from home.  Pt c/o dizziness x 24 hours.  Itching x 1 week all over.  Symptoms started after stopping medications that she was taking for anxiety.  Pt had been pulling her hair out.  Vitals: 163/105, hr 110, resp 16,

## 2015-06-19 NOTE — ED Notes (Signed)
Called from triage, no response

## 2015-07-01 ENCOUNTER — Encounter (HOSPITAL_COMMUNITY): Payer: Self-pay | Admitting: *Deleted

## 2015-07-01 ENCOUNTER — Emergency Department (HOSPITAL_COMMUNITY)
Admission: EM | Admit: 2015-07-01 | Discharge: 2015-07-01 | Disposition: A | Discharge status: Home / Self Care | Payer: Medicaid Other | Attending: Physician Assistant | Admitting: Physician Assistant

## 2015-07-01 DIAGNOSIS — N760 Acute vaginitis: Secondary | ICD-10-CM | POA: Diagnosis not present

## 2015-07-01 DIAGNOSIS — E119 Type 2 diabetes mellitus without complications: Secondary | ICD-10-CM | POA: Diagnosis not present

## 2015-07-01 DIAGNOSIS — E669 Obesity, unspecified: Secondary | ICD-10-CM | POA: Diagnosis not present

## 2015-07-01 DIAGNOSIS — Z79899 Other long term (current) drug therapy: Secondary | ICD-10-CM | POA: Insufficient documentation

## 2015-07-01 DIAGNOSIS — F319 Bipolar disorder, unspecified: Secondary | ICD-10-CM | POA: Diagnosis not present

## 2015-07-01 DIAGNOSIS — Z7984 Long term (current) use of oral hypoglycemic drugs: Secondary | ICD-10-CM | POA: Diagnosis not present

## 2015-07-01 DIAGNOSIS — Z880 Allergy status to penicillin: Secondary | ICD-10-CM | POA: Insufficient documentation

## 2015-07-01 DIAGNOSIS — R1030 Lower abdominal pain, unspecified: Secondary | ICD-10-CM | POA: Diagnosis present

## 2015-07-01 DIAGNOSIS — F17210 Nicotine dependence, cigarettes, uncomplicated: Secondary | ICD-10-CM | POA: Insufficient documentation

## 2015-07-01 DIAGNOSIS — F419 Anxiety disorder, unspecified: Secondary | ICD-10-CM | POA: Diagnosis not present

## 2015-07-01 DIAGNOSIS — B9689 Other specified bacterial agents as the cause of diseases classified elsewhere: Secondary | ICD-10-CM

## 2015-07-01 DIAGNOSIS — Z3202 Encounter for pregnancy test, result negative: Secondary | ICD-10-CM | POA: Insufficient documentation

## 2015-07-01 HISTORY — DX: Obesity, unspecified: E66.9

## 2015-07-01 LAB — URINE MICROSCOPIC-ADD ON

## 2015-07-01 LAB — CBC
HCT: 40.6 % (ref 36.0–46.0)
HEMOGLOBIN: 12.7 g/dL (ref 12.0–15.0)
MCH: 25.5 pg — AB (ref 26.0–34.0)
MCHC: 31.3 g/dL (ref 30.0–36.0)
MCV: 81.5 fL (ref 78.0–100.0)
Platelets: 251 10*3/uL (ref 150–400)
RBC: 4.98 MIL/uL (ref 3.87–5.11)
RDW: 14.1 % (ref 11.5–15.5)
WBC: 8 10*3/uL (ref 4.0–10.5)

## 2015-07-01 LAB — I-STAT BETA HCG BLOOD, ED (MC, WL, AP ONLY)

## 2015-07-01 LAB — COMPREHENSIVE METABOLIC PANEL
ALBUMIN: 3.8 g/dL (ref 3.5–5.0)
ALK PHOS: 61 U/L (ref 38–126)
ALT: 25 U/L (ref 14–54)
ANION GAP: 10 (ref 5–15)
AST: 31 U/L (ref 15–41)
BUN: 11 mg/dL (ref 6–20)
CALCIUM: 9.2 mg/dL (ref 8.9–10.3)
CO2: 25 mmol/L (ref 22–32)
Chloride: 106 mmol/L (ref 101–111)
Creatinine, Ser: 0.78 mg/dL (ref 0.44–1.00)
GFR calc non Af Amer: >60 mL/min (ref >60)
Glucose, Bld: 152 mg/dL — ABNORMAL HIGH (ref 65–99)
Potassium: 4.4 mmol/L (ref 3.5–5.1)
SODIUM: 141 mmol/L (ref 135–145)
Total Bilirubin: 0.8 mg/dL (ref 0.3–1.2)
Total Protein: 6.7 g/dL (ref 6.5–8.1)

## 2015-07-01 LAB — WET PREP, GENITAL
Sperm: NONE SEEN
TRICH WET PREP: NONE SEEN
YEAST WET PREP: NONE SEEN

## 2015-07-01 LAB — URINALYSIS, ROUTINE W REFLEX MICROSCOPIC
GLUCOSE, UA: 500 mg/dL — AB
HGB URINE DIPSTICK: NEGATIVE
Ketones, ur: NEGATIVE mg/dL
Leukocytes, UA: NEGATIVE
Nitrite: NEGATIVE
PH: 5.5 (ref 5.0–8.0)
Protein, ur: 30 mg/dL — AB
SPECIFIC GRAVITY, URINE: 1.039 — AB (ref 1.005–1.030)

## 2015-07-01 LAB — LIPASE, BLOOD: Lipase: 22 U/L (ref 11–51)

## 2015-07-01 MED ORDER — METRONIDAZOLE 500 MG PO TABS: 500.0000 mg | ORAL_TABLET | Freq: Two times a day (BID) | ORAL | Status: DC

## 2015-07-01 MED ORDER — DOXYCYCLINE HYCLATE 100 MG PO CAPS: 100.0000 mg | ORAL_CAPSULE | Freq: Two times a day (BID) | ORAL | Status: DC

## 2015-07-01 NOTE — Discharge Instructions (Signed)
Please read and follow all provided instructions.  Your diagnoses today include:  1. BV (bacterial vaginosis)     Tests performed today include:  Vital signs. See below for your results today.   Medications prescribed:   Take any medications as prescribed.   Home care instructions:  Follow any educational materials contained in this packet.  Follow-up instructions: Please follow-up with your primary care provider in the next week if symptoms persist   Return instructions:   Please return to the Emergency Department if you do not get better, if you get worse, or new symptoms OR  - Fever (temperature greater than 101.14F)  - Bleeding that does not stop with holding pressure to the area    -Severe pain (please note that you may be more sore the day after your accident)  - Chest Pain  - Difficulty breathing  - Severe nausea or vomiting  - Inability to tolerate food and liquids  - Passing out  - Skin becoming red around your wounds  - Change in mental status (confusion or lethargy)  - New numbness or weakness     Please return if you have any other emergent concerns.  Additional Information:  Your vital signs today were: BP 155/116 mmHg   Pulse 104   Temp(Src) 98.9 F (37.2 C)   Resp 18   Ht 5\' 7"  (1.702 m)   Wt 122.471 kg   BMI 42.28 kg/m2   SpO2 99% If your blood pressure (BP) was elevated above 135/85 this visit, please have this repeated by your doctor within one month. ---------------

## 2015-07-01 NOTE — Care Management (Signed)
ED CM noted patient to have had 12 visits in the past 6 months, for various complaints. CM met with patient at bedside to offer assistance with follow up care. Patient states she is a patient at [facility], next appt a week from today.encouraged patient to keep appt, Discussed the importance of follow-up and how to access care in the appropriate settings, teach back done, patient verbalized understanding. Provided patient with Access to [facility] handout.  No further ED CM needs identified.

## 2015-07-01 NOTE — ED Notes (Signed)
RN attempted to discharge patient; pt not in room

## 2015-07-01 NOTE — ED Notes (Signed)
Pt still not in room; bystanders report "she left"; patient left without discharge instructions or prescriptions

## 2015-07-01 NOTE — ED Notes (Signed)
Called main lab to inquire as to when UA will result. Awaiting results for discharge.

## 2015-07-01 NOTE — ED Notes (Signed)
Pt reports generalized sharp abd pains x 2 week. Reports episodes of vaginal discharge, denies n/v/d or urinary symptoms.

## 2015-07-01 NOTE — ED Notes (Signed)
Pt stepped out of room to nurses station stating "Im ready to go and I want something to eat; I have to be gone from here in 30 mins"

## 2015-07-01 NOTE — ED Provider Notes (Signed)
CSN: [id number]     Arrival date & time 07/01/15  1225 History   First MD Initiated Contact with Patient 07/01/15 1630     Chief Complaint  Patient presents with  . Abdominal Pain   (Consider location/radiation/quality/duration/timing/severity/associated sxs/prior Treatment) HPI 25 y.o. female with a hx of DM, presents to the Emergency Department today complaining of lower abdominal pain x 2 weeks. Notes pain is in the middle of her pelvis. Notes pain is 9/10 and constant for 2 weeks. Notes yellow discharge. Has not tried OTC remedies. No hx of menstrual irregularities. No previous surgeries. No N/V/D. No CP/SOB. No dysuria. No hematuria. No vaginal bleeding. No decrease in PO intake.     Pt is on Nexplanon. Unknown LMP      Past Medical History  Diagnosis Date  . Anxiety   . Mental disorder   . Depression   . Cognitive deficits   . Bipolar 1 disorder ([place])   . Diabetes mellitus without complication ([place])   . Obesity    Past Surgical History  Procedure Laterality Date  . Cesarean section    . Tonsillectomy N/A 06/03/2012    Procedure: TONSILLECTOMY;  Surgeon: [name], MD;  Location: [place];  Service: ENT;  Laterality: N/A;  . Mass excision N/A 06/03/2012    Procedure: EXCISION MASS;  Surgeon: [name], MD;  Location: [place];  Service: ENT;  Laterality: N/A;  Excision uvula mass  . Cesarean section N/A 04/25/2013    Procedure: REPEAT CESAREAN SECTION;  Surgeon: [name], MD;  Location: [facility];  Service: Obstetrics;  Laterality: N/A;  . Tonsillectomy     Family History  Problem Relation Age of Onset  . Hypertension Mother   . Diabetes Father    Social History  Substance Use Topics  . Smoking status: Current Every Day Smoker -- 0.50 packs/day for 7 years    Types: Cigarettes  . Smokeless tobacco: Never Used  . Alcohol Use: No   OB History    Gravida Para Term Preterm AB TAB SAB Ectopic Multiple Living   3 3 3  0 0 0 0  0 0 3     Review of Systems ROS reviewed and all are negative for acute change except as noted in the HPI.  Allergies  Penicillins  Home Medications   Prior to Admission medications   Medication Sig Start Date End Date Taking? Authorizing Provider  busPIRone (BUSPAR) 15 MG tablet Take 1 tablet (15 mg total) by mouth 2 (two) times daily. For anxiety 05/14/15   Encarnacion Slates, NP  cetirizine (ZYRTEC) 10 MG tablet Take 1 tablet (10 mg total) by mouth daily. For allergies 05/14/15   Encarnacion Slates, NP  DULoxetine (CYMBALTA) 60 MG capsule Take 1 capsule (60 mg total) by mouth 2 (two) times daily. For depression 05/14/15   Encarnacion Slates, NP  fluticasone (FLONASE) 50 MCG/ACT nasal spray Place 1 spray into both nostrils daily as needed. Allergies 05/14/15   Encarnacion Slates, NP  glipiZIDE (GLUCOTROL XL) 5 MG 24 hr tablet Take 5 mg by mouth daily with breakfast.    Historical Provider, MD  metFORMIN (GLUCOPHAGE) 500 MG tablet Take 1 tablet (500 mg total) by mouth 2 (two) times daily. For diabetes management 05/14/15   Encarnacion Slates, NP  nicotine (NICODERM CQ - DOSED IN MG/24 HOURS) 21 mg/24hr patch Place 1 patch (21 mg total) onto the skin daily. For smoking cessation 05/14/15   Loleta Dicker  [place], NP  traZODone (DESYREL) 50 MG tablet Take 1 tablet (50 mg total) by mouth at bedtime as needed for sleep. 05/14/15   Encarnacion Slates, NP   BP 128/96 mmHg  Pulse 106  Temp(Src) 98.9 F (37.2 C)  Resp 18  Ht 5\' 7"  (1.702 m)  Wt 122.471 kg  BMI 42.28 kg/m2  SpO2 98%   Physical Exam  Constitutional: She is oriented to person, place, and time. She appears well-developed and well-nourished.  HENT:  Head: Normocephalic and atraumatic.  Eyes: EOM are normal.  Neck: Normal range of motion.  Cardiovascular: Normal rate, regular rhythm and normal heart sounds.   Pulmonary/Chest: Effort normal and breath sounds normal.  Abdominal: Soft. Normal appearance and bowel sounds are normal. She exhibits no distension. There is  tenderness in the suprapubic area. There is no rigidity, no rebound, no guarding, no CVA tenderness, no tenderness at McBurney's point and negative Murphy's sign.  Musculoskeletal: Normal range of motion.  Neurological: She is alert and oriented to person, place, and time.  Skin: Skin is warm and dry.  Psychiatric: She has a normal mood and affect. Her behavior is normal. Thought content normal.  Nursing note and vitals reviewed.  Exam performed by Ozella Rocks,  exam chaperoned Date: 07/01/2015 Pelvic exam: normal external genitalia without evidence of trauma. VULVA: normal appearing vulva with no masses, tenderness or lesion. VAGINA: normal appearing vagina with normal color and discharge, no lesions. CERVIX: normal appearing cervix without lesions, cervical motion tenderness absent, cervical os closed with out purulent discharge; vaginal discharge - clear, odorless and thin, Wet prep and DNA probe for chlamydia and GC obtained.   ADNEXA: normal adnexa in size, nontender and no masses UTERUS: uterus is normal size, shape, consistency and nontender.    ED Course  Procedures (including critical care time) Labs Review Labs Reviewed  WET PREP, GENITAL - Abnormal; Notable for the following:    Clue Cells Wet Prep HPF POC PRESENT (*)    WBC, Wet Prep HPF POC MODERATE (*)    All other components within normal limits  COMPREHENSIVE METABOLIC PANEL - Abnormal; Notable for the following:    Glucose, Bld 152 (*)    All other components within normal limits  CBC - Abnormal; Notable for the following:    MCH 25.5 (*)    All other components within normal limits  LIPASE, BLOOD  URINALYSIS, ROUTINE W REFLEX MICROSCOPIC (NOT AT [facility])  I-STAT BETA HCG BLOOD, ED ([facility], [facility], [facility] ONLY)  GC/CHLAMYDIA PROBE AMP ([place]) NOT AT [facility]   Imaging Review No results found. I have personally reviewed and evaluated these images and lab results as part of my medical decision-making.   EKG  Interpretation None      MDM  I have reviewed and evaluated the relevant laboratory values.I have reviewed and evaluated the relevant imaging studies.I personally evaluated and interpreted the relevant EKG.I have reviewed the relevant previous healthcare records.I have reviewed EMS Documentation.I obtained HPI from historian. Patient discussed with supervising physician  ED Course:  Assessment: 1y F presents with lower abdominal pain x 2 weeks. Suprapubic pain. On exam, NAD. VS show tachycardia. Afebrile. Normotensive. TTP suprapubic. Pelvic exam unremarkable. No Adnexal tenderness. No CMT.  Neg RLQ pain. Labs neg lipase. CMP unremarkable. CBC show no leukocytosis. Neg pregnancy. GC/Wet Prep showed clue cells and mod leukocytosis. Treating with Flagyl and Doxy (PCN allergy) due to suspected STD as well. At time of discharge, Patient is in no acute  distress. Vital Signs are stable. Patient is able to ambulate. Patient able to tolerate PO.       Disposition/Plan:  DC Home Additional Verbal discharge instructions given and discussed with patient.  Pt Instructed to f/u with PCP in the next 48 hours for evaluation and treatment of symptoms. Return precautions given Pt acknowledges and agrees with plan  Supervising Physician Courteney Julio Alm, MD   Final diagnoses:  BV (bacterial vaginosis)      [name], PA-C 07/02/15 0106  Courteney Lyn Mackuen, MD 07/02/15 [id number]

## 2015-07-02 ENCOUNTER — Encounter (HOSPITAL_COMMUNITY): Payer: Self-pay | Admitting: Emergency Medicine

## 2015-07-02 ENCOUNTER — Emergency Department (HOSPITAL_COMMUNITY)
Admission: EM | Admit: 2015-07-02 | Discharge: 2015-07-02 | Disposition: A | Discharge status: Home / Self Care | Payer: Medicaid Other | Attending: Emergency Medicine | Admitting: Emergency Medicine

## 2015-07-02 DIAGNOSIS — F419 Anxiety disorder, unspecified: Secondary | ICD-10-CM | POA: Insufficient documentation

## 2015-07-02 DIAGNOSIS — F17210 Nicotine dependence, cigarettes, uncomplicated: Secondary | ICD-10-CM | POA: Diagnosis not present

## 2015-07-02 DIAGNOSIS — M79605 Pain in left leg: Secondary | ICD-10-CM

## 2015-07-02 DIAGNOSIS — Z792 Long term (current) use of antibiotics: Secondary | ICD-10-CM | POA: Diagnosis not present

## 2015-07-02 DIAGNOSIS — E119 Type 2 diabetes mellitus without complications: Secondary | ICD-10-CM | POA: Diagnosis not present

## 2015-07-02 DIAGNOSIS — Z79899 Other long term (current) drug therapy: Secondary | ICD-10-CM | POA: Diagnosis not present

## 2015-07-02 DIAGNOSIS — N760 Acute vaginitis: Secondary | ICD-10-CM | POA: Insufficient documentation

## 2015-07-02 DIAGNOSIS — R1084 Generalized abdominal pain: Secondary | ICD-10-CM

## 2015-07-02 DIAGNOSIS — F319 Bipolar disorder, unspecified: Secondary | ICD-10-CM | POA: Diagnosis not present

## 2015-07-02 DIAGNOSIS — E669 Obesity, unspecified: Secondary | ICD-10-CM | POA: Diagnosis not present

## 2015-07-02 DIAGNOSIS — R109 Unspecified abdominal pain: Secondary | ICD-10-CM | POA: Diagnosis present

## 2015-07-02 DIAGNOSIS — Z7984 Long term (current) use of oral hypoglycemic drugs: Secondary | ICD-10-CM | POA: Diagnosis not present

## 2015-07-02 DIAGNOSIS — Z880 Allergy status to penicillin: Secondary | ICD-10-CM | POA: Insufficient documentation

## 2015-07-02 DIAGNOSIS — B9689 Other specified bacterial agents as the cause of diseases classified elsewhere: Secondary | ICD-10-CM

## 2015-07-02 LAB — GC/CHLAMYDIA PROBE AMP (~~LOC~~) NOT AT ARMC
Chlamydia: NEGATIVE
Neisseria Gonorrhea: NEGATIVE

## 2015-07-02 MED ORDER — DOXYCYCLINE HYCLATE 100 MG PO CAPS: 100.0000 mg | ORAL_CAPSULE | Freq: Two times a day (BID) | ORAL | Status: DC

## 2015-07-02 MED ORDER — METRONIDAZOLE 500 MG PO TABS: 500.0000 mg | ORAL_TABLET | Freq: Two times a day (BID) | ORAL | Status: DC

## 2015-07-02 MED ORDER — IBUPROFEN 800 MG PO TABS
800.0000 mg | ORAL_TABLET | Freq: Once | ORAL | Status: AC
  Administered 2015-07-02: 800 mg via ORAL
  Filled 2015-07-02: qty 1

## 2015-07-02 MED ORDER — IBUPROFEN 800 MG PO TABS: 800.0000 mg | ORAL_TABLET | Freq: Three times a day (TID) | ORAL | Status: DC

## 2015-07-02 NOTE — ED Notes (Signed)
MD at bedside. 

## 2015-07-02 NOTE — ED Provider Notes (Signed)
CSN: [id number]     Arrival date & time 07/02/15  0009 History  By signing my name below, I, [name], attest that this documentation has been prepared under the direction and in the presence of Carmin Muskrat, MD. Electronically Signed: [name], ED Scribe. 07/02/2015. 1:10 AM.   Chief Complaint  Patient presents with  . Abdominal Pain   The history is provided by the patient. No language interpreter was used.     HPI Comments: [name] is a 25 y.o. female who presents to the Emergency Department complaining of sharp abdominal pain x 3 weeks. Pt was seen at [facility] ED earlier today for the abdominal pain. She had a pelvic exam done and a UA with findings of clue cells and positive leukocytosis. She was prescribed Flagyl and Doxycycline but left before being discharged due to having to catch a bus. Pt returns to the ED tonight for the same abdominal pain as well as left leg pain and swelling that began last night. She has not taken any OTC pain medication for her leg pain. Denies chest pain, shortness of breath, syncope, or any other associated symptoms.   Past Medical History  Diagnosis Date  . Anxiety   . Mental disorder   . Depression   . Cognitive deficits   . Bipolar 1 disorder ([place])   . Diabetes mellitus without complication ([place])   . Obesity    Past Surgical History  Procedure Laterality Date  . Cesarean section    . Tonsillectomy N/A 06/03/2012    Procedure: TONSILLECTOMY;  Surgeon: [name], MD;  Location: [place];  Service: ENT;  Laterality: N/A;  . Mass excision N/A 06/03/2012    Procedure: EXCISION MASS;  Surgeon: [name], MD;  Location: [place];  Service: ENT;  Laterality: N/A;  Excision uvula mass  . Cesarean section N/A 04/25/2013    Procedure: REPEAT CESAREAN SECTION;  Surgeon: [name], MD;  Location: [facility];  Service: Obstetrics;  Laterality: N/A;  . Tonsillectomy     Family History  Problem  Relation Age of Onset  . Hypertension Mother   . Diabetes Father    Social History  Substance Use Topics  . Smoking status: Current Every Day Smoker -- 0.50 packs/day for 7 years    Types: Cigarettes  . Smokeless tobacco: Never Used  . Alcohol Use: No   OB History    Gravida Para Term Preterm AB TAB SAB Ectopic Multiple Living   3 3 3  0 0 0 0 0 0 3     Review of Systems  Constitutional:       Per HPI, otherwise negative  HENT:       Per HPI, otherwise negative  Respiratory: Negative for shortness of breath.        Per HPI, otherwise negative  Cardiovascular: Negative for chest pain.       Per HPI, otherwise negative  Gastrointestinal: Positive for abdominal pain. Negative for vomiting.  Endocrine:       Negative aside from HPI  Genitourinary:       Neg aside from HPI   Musculoskeletal: Positive for joint swelling and arthralgias.       Per HPI, otherwise negative  Skin: Negative.   Neurological: Negative for syncope.   Allergies  Penicillins  Home Medications   Prior to Admission medications   Medication Sig Start Date End Date Taking? Authorizing Provider  busPIRone (BUSPAR) 15 MG tablet Take 1 tablet (  15 mg total) by mouth 2 (two) times daily. For anxiety 05/14/15   Encarnacion Slates, NP  cetirizine (ZYRTEC) 10 MG tablet Take 1 tablet (10 mg total) by mouth daily. For allergies Patient taking differently: Take 10 mg by mouth daily as needed for allergies. For allergies 05/14/15   Encarnacion Slates, NP  doxycycline (VIBRAMYCIN) 100 MG capsule Take 1 capsule (100 mg total) by mouth 2 (two) times daily. 07/01/15   [name], PA-C  DULoxetine (CYMBALTA) 60 MG capsule Take 1 capsule (60 mg total) by mouth 2 (two) times daily. For depression Patient taking differently: Take 60 mg by mouth daily. For depression 05/14/15   Encarnacion Slates, NP  fluticasone (FLONASE) 50 MCG/ACT nasal spray Place 1 spray into both nostrils daily as needed. Allergies 05/14/15   Encarnacion Slates, NP  glipiZIDE  (GLUCOTROL XL) 5 MG 24 hr tablet Take 5 mg by mouth daily with breakfast.    Historical Provider, MD  metFORMIN (GLUCOPHAGE) 500 MG tablet Take 1 tablet (500 mg total) by mouth 2 (two) times daily. For diabetes management 05/14/15   Encarnacion Slates, NP  metroNIDAZOLE (FLAGYL) 500 MG tablet Take 1 tablet (500 mg total) by mouth 2 (two) times daily. 07/01/15   [name], PA-C  nicotine (NICODERM CQ - DOSED IN MG/24 HOURS) 21 mg/24hr patch Place 1 patch (21 mg total) onto the skin daily. For smoking cessation 05/14/15   Encarnacion Slates, NP  traZODone (DESYREL) 50 MG tablet Take 1 tablet (50 mg total) by mouth at bedtime as needed for sleep. 05/14/15   Encarnacion Slates, NP   BP 148/97 mmHg  Pulse 113  Temp(Src) 98.9 F (37.2 C) (Oral)  Resp 18  SpO2 99%   Physical Exam  Constitutional: She is oriented to person, place, and time. She appears well-developed and well-nourished.  HENT:  Head: Normocephalic and atraumatic.  Eyes: EOM are normal.  Neck: Normal range of motion.  Cardiovascular: Normal rate, regular rhythm and normal heart sounds.   Pulmonary/Chest: Effort normal and breath sounds normal.  Abdominal: Soft. Normal appearance and bowel sounds are normal. She exhibits no distension. There is tenderness in the suprapubic area. There is no rigidity, no rebound, no guarding, no CVA tenderness, no tenderness at McBurney's point and negative Murphy's sign.  Genitourinary:  Pelvic exam deferred as it was done within the past 12 hours  Musculoskeletal: Normal range of motion.  Mild tenderness to palpation on the left calf, no appreciable asymmetry, no distention no superficial changes  Neurological: She is alert and oriented to person, place, and time.  Skin: Skin is warm and dry.  Psychiatric:  Patient has little insight into her presentation  Nursing note and vitals reviewed.   ED Course  Procedures (including critical care time)  DIAGNOSTIC STUDIES: Oxygen Saturation is 99% on RA, normal by  my interpretation.    COORDINATION OF CARE: 1:10 AM-Discussed treatment plan with pt at bedside and pt agreed to plan.   Chart review notable for visit earlier today, as well as 13 visits over 6 months.   MDM   I personally performed the services described in this documentation, which was scribed in my presence. The recorded information has been reviewed and is accurate.   Patient presents for second time today, with concern of ongoing suprapubic pain, left calf pain. Here, no evidence for DVT, and the patient is found to have bacterial vaginosis, earlier today. No evidence for peritonitis, sepsis, bacteremia. Patient was provided  antibiotics that were prescribed earlier today, but the patient did not obtain.  Patient will follow-up with primary care.   Carmin Muskrat, MD 07/02/15 [phone]

## 2015-07-02 NOTE — Discharge Instructions (Signed)
As discussed, it is important that you follow up as soon as possible with your physician for continued management of your condition. ° °If you develop any new, or concerning changes in your condition, please return to the emergency department immediately. ° °

## 2015-07-02 NOTE — ED Notes (Signed)
Patient presents for abdominal pain, and left calf pain x1 day. Denies N/V/D, urinary symptoms.

## 2015-07-02 NOTE — ED Notes (Addendum)
Patient seen at [facility] yesterday left before discharge instructions or prescriptions. C/o mid upper abdominal pain, urinary frequency and left calf pain/swelling. Warm to touch, no redness or obvious swelling noted.

## 2015-07-07 ENCOUNTER — Encounter (HOSPITAL_COMMUNITY): Payer: Self-pay | Admitting: Emergency Medicine

## 2015-07-07 ENCOUNTER — Emergency Department (HOSPITAL_COMMUNITY)
Admission: EM | Admit: 2015-07-07 | Discharge: 2015-07-08 | Disposition: A | Discharge status: Home / Self Care | Payer: Medicaid Other | Attending: Emergency Medicine | Admitting: Emergency Medicine

## 2015-07-07 ENCOUNTER — Emergency Department (HOSPITAL_COMMUNITY): Payer: Medicaid Other

## 2015-07-07 DIAGNOSIS — Z792 Long term (current) use of antibiotics: Secondary | ICD-10-CM | POA: Diagnosis not present

## 2015-07-07 DIAGNOSIS — F17210 Nicotine dependence, cigarettes, uncomplicated: Secondary | ICD-10-CM | POA: Insufficient documentation

## 2015-07-07 DIAGNOSIS — F419 Anxiety disorder, unspecified: Secondary | ICD-10-CM | POA: Insufficient documentation

## 2015-07-07 DIAGNOSIS — Z7984 Long term (current) use of oral hypoglycemic drugs: Secondary | ICD-10-CM | POA: Diagnosis not present

## 2015-07-07 DIAGNOSIS — Z880 Allergy status to penicillin: Secondary | ICD-10-CM | POA: Diagnosis not present

## 2015-07-07 DIAGNOSIS — R1030 Lower abdominal pain, unspecified: Secondary | ICD-10-CM | POA: Insufficient documentation

## 2015-07-07 DIAGNOSIS — Z3202 Encounter for pregnancy test, result negative: Secondary | ICD-10-CM | POA: Diagnosis not present

## 2015-07-07 DIAGNOSIS — E119 Type 2 diabetes mellitus without complications: Secondary | ICD-10-CM | POA: Diagnosis not present

## 2015-07-07 DIAGNOSIS — Z79899 Other long term (current) drug therapy: Secondary | ICD-10-CM | POA: Diagnosis not present

## 2015-07-07 DIAGNOSIS — E669 Obesity, unspecified: Secondary | ICD-10-CM | POA: Diagnosis not present

## 2015-07-07 DIAGNOSIS — F319 Bipolar disorder, unspecified: Secondary | ICD-10-CM | POA: Diagnosis not present

## 2015-07-07 LAB — POC URINE PREG, ED: Preg Test, Ur: NEGATIVE

## 2015-07-07 MED ORDER — IOHEXOL 300 MG/ML  SOLN
25.0000 mL | Freq: Once | INTRAMUSCULAR | Status: AC | PRN
  Administered 2015-07-07: 25 mL via ORAL

## 2015-07-07 NOTE — ED Notes (Signed)
Per PTAR pt c/o abdominal pain as well as blood in her urine this am.  Rates pain 8/10.  Pt ambulatory to room.

## 2015-07-07 NOTE — ED Notes (Signed)
Bed: [id number] Expected date:  Expected time:  Means of arrival:  Comments: EMS 24yo lower abd pain

## 2015-07-07 NOTE — ED Provider Notes (Signed)
CSN: [id number]     Arrival date & time 07/07/15  2153 History   First MD Initiated Contact with Patient 07/07/15 2211     Chief Complaint  Patient presents with  . Abdominal Pain     (Consider location/radiation/quality/duration/timing/severity/associated sxs/prior Treatment) HPI Patient poor she's been having increasing lower abdominal pain for several weeks. She reports that she has been evaluated for but it's not improving. She states that she is getting increasing swelling of her lower abdomen. She denies vomiting and diarrhea. She reports that she had a normal bowel movement yesterday. No vaginal bleeding or discharge. Pain worse with movements. Past Medical History  Diagnosis Date  . Anxiety   . Mental disorder   . Depression   . Cognitive deficits   . Bipolar 1 disorder ([place])   . Diabetes mellitus without complication ([place])   . Obesity    Past Surgical History  Procedure Laterality Date  . Cesarean section    . Tonsillectomy N/A 06/03/2012    Procedure: TONSILLECTOMY;  Surgeon: [name], MD;  Location: [place];  Service: ENT;  Laterality: N/A;  . Mass excision N/A 06/03/2012    Procedure: EXCISION MASS;  Surgeon: [name], MD;  Location: [place];  Service: ENT;  Laterality: N/A;  Excision uvula mass  . Cesarean section N/A 04/25/2013    Procedure: REPEAT CESAREAN SECTION;  Surgeon: [name], MD;  Location: [facility];  Service: Obstetrics;  Laterality: N/A;  . Tonsillectomy     Family History  Problem Relation Age of Onset  . Hypertension Mother   . Diabetes Father    Social History  Substance Use Topics  . Smoking status: Current Every Day Smoker -- 0.50 packs/day for 7 years    Types: Cigarettes  . Smokeless tobacco: Never Used  . Alcohol Use: No   OB History    Gravida Para Term Preterm AB TAB SAB Ectopic Multiple Living   3 3 3  0 0 0 0 0 0 3     Review of Systems  10 Systems reviewed and are negative for  acute change except as noted in the HPI.  Allergies  Penicillins  Home Medications   Prior to Admission medications   Medication Sig Start Date End Date Taking? Authorizing Provider  busPIRone (BUSPAR) 15 MG tablet Take 1 tablet (15 mg total) by mouth 2 (two) times daily. For anxiety 05/14/15  Yes Encarnacion Slates, NP  cetirizine (ZYRTEC) 10 MG tablet Take 1 tablet (10 mg total) by mouth daily. For allergies Patient taking differently: Take 10 mg by mouth daily as needed for allergies. For allergies 05/14/15  Yes Encarnacion Slates, NP  doxycycline (VIBRAMYCIN) 100 MG capsule Take 1 capsule (100 mg total) by mouth 2 (two) times daily. 07/02/15  Yes Carmin Muskrat, MD  DULoxetine (CYMBALTA) 60 MG capsule Take 1 capsule (60 mg total) by mouth 2 (two) times daily. For depression Patient taking differently: Take 60 mg by mouth daily. For depression 05/14/15  Yes Encarnacion Slates, NP  fluticasone (FLONASE) 50 MCG/ACT nasal spray Place 1 spray into both nostrils daily as needed. Allergies 05/14/15  Yes Encarnacion Slates, NP  glipiZIDE (GLUCOTROL XL) 5 MG 24 hr tablet Take 5 mg by mouth daily with breakfast.   Yes Historical Provider, MD  ibuprofen (ADVIL,MOTRIN) 800 MG tablet Take 1 tablet (800 mg total) by mouth 3 (three) times daily. Patient taking differently: Take 800 mg by mouth 3 (three) times daily as needed (  for pain.).  07/02/15  Yes Carmin Muskrat, MD  metFORMIN (GLUCOPHAGE) 500 MG tablet Take 1 tablet (500 mg total) by mouth 2 (two) times daily. For diabetes management 05/14/15  Yes Encarnacion Slates, NP  metroNIDAZOLE (FLAGYL) 500 MG tablet Take 1 tablet (500 mg total) by mouth 2 (two) times daily. 07/02/15  Yes Carmin Muskrat, MD  nicotine (NICODERM CQ - DOSED IN MG/24 HOURS) 21 mg/24hr patch Place 1 patch (21 mg total) onto the skin daily. For smoking cessation Patient not taking: Reported on 07/07/2015 05/14/15   Encarnacion Slates, NP  traZODone (DESYREL) 50 MG tablet Take 1 tablet (50 mg total) by mouth at bedtime  as needed for sleep. Patient not taking: Reported on 07/07/2015 05/14/15   Encarnacion Slates, NP   BP 151/88 mmHg  Pulse 110  Temp(Src) 98.2 F (36.8 C) (Oral)  Resp 20  Ht 5\' 7"  (1.702 m)  Wt 270 lb (122.471 kg)  BMI 42.28 kg/m2  SpO2 98% Physical Exam  Constitutional: She is oriented to person, place, and time. She appears well-developed and well-nourished.  HENT:  Head: Normocephalic and atraumatic.  Eyes: EOM are normal. Pupils are equal, round, and reactive to light.  Neck: Neck supple.  Cardiovascular: Normal rate, regular rhythm, normal heart sounds and intact distal pulses.   Pulmonary/Chest: Effort normal and breath sounds normal.  Abdominal: Soft. Bowel sounds are normal. She exhibits mass. She exhibits no distension. There is tenderness.  Lower abdomen tender to palpation centrally. Patient does have a morbidly obese abdomen but seems to have firmness suggestive of large uterine size.  Musculoskeletal: Normal range of motion. She exhibits no edema or tenderness.  Neurological: She is alert and oriented to person, place, and time. She has normal strength. Coordination normal. GCS eye subscore is 4. GCS verbal subscore is 5. GCS motor subscore is 6.  Skin: Skin is warm, dry and intact.  Psychiatric: She has a normal mood and affect.    ED Course  Procedures (including critical care time) Labs Review Labs Reviewed  LIPASE, BLOOD  COMPREHENSIVE METABOLIC PANEL  CBC  URINALYSIS, ROUTINE W REFLEX MICROSCOPIC (NOT AT [facility])  POC URINE PREG, ED    Imaging Review No results found. I have personally reviewed and evaluated these images and lab results as part of my medical decision-making.   EKG Interpretation None      MDM   Final diagnoses:  Lower abdominal pain   CT and labs pending. Dr. Wilson Singer to follow up results for final disposition. Patient had pelvic 6 days ago with negative GC/Chlamydia and wet prep negative for trich. Patient is clinically well in appearance,  if CT and other diagnostics WNL, feel patient is stable for D/C.    Charlesetta Shanks, MD 07/08/15 Laureen Abrahams

## 2015-07-08 ENCOUNTER — Encounter (HOSPITAL_COMMUNITY): Payer: Self-pay

## 2015-07-08 ENCOUNTER — Emergency Department (HOSPITAL_COMMUNITY): Payer: Medicaid Other

## 2015-07-08 LAB — URINALYSIS, ROUTINE W REFLEX MICROSCOPIC
BILIRUBIN URINE: NEGATIVE
Glucose, UA: NEGATIVE mg/dL
Ketones, ur: NEGATIVE mg/dL
Leukocytes, UA: NEGATIVE
NITRITE: NEGATIVE
PROTEIN: NEGATIVE mg/dL
SPECIFIC GRAVITY, URINE: 1.029 (ref 1.005–1.030)
pH: 5.5 (ref 5.0–8.0)

## 2015-07-08 LAB — URINE MICROSCOPIC-ADD ON

## 2015-07-08 LAB — CBC
HCT: 39.7 % (ref 36.0–46.0)
HEMOGLOBIN: 13 g/dL (ref 12.0–15.0)
MCH: 26.1 pg (ref 26.0–34.0)
MCHC: 32.7 g/dL (ref 30.0–36.0)
MCV: 79.7 fL (ref 78.0–100.0)
PLATELETS: 245 10*3/uL (ref 150–400)
RBC: 4.98 MIL/uL (ref 3.87–5.11)
RDW: 14 % (ref 11.5–15.5)
WBC: 12.3 10*3/uL — ABNORMAL HIGH (ref 4.0–10.5)

## 2015-07-08 LAB — COMPREHENSIVE METABOLIC PANEL
ALK PHOS: 64 U/L (ref 38–126)
ALT: 26 U/L (ref 14–54)
ANION GAP: 10 (ref 5–15)
AST: 26 U/L (ref 15–41)
Albumin: 4.4 g/dL (ref 3.5–5.0)
BILIRUBIN TOTAL: 0.5 mg/dL (ref 0.3–1.2)
BUN: 11 mg/dL (ref 6–20)
CALCIUM: 9.9 mg/dL (ref 8.9–10.3)
CO2: 28 mmol/L (ref 22–32)
CREATININE: 0.87 mg/dL (ref 0.44–1.00)
Chloride: 104 mmol/L (ref 101–111)
Glucose, Bld: 147 mg/dL — ABNORMAL HIGH (ref 65–99)
Potassium: 4.1 mmol/L (ref 3.5–5.1)
SODIUM: 142 mmol/L (ref 135–145)
TOTAL PROTEIN: 8 g/dL (ref 6.5–8.1)

## 2015-07-08 LAB — LIPASE, BLOOD: Lipase: 25 U/L (ref 11–51)

## 2015-07-08 MED ORDER — OXYCODONE-ACETAMINOPHEN 5-325 MG PO TABS: 1.0000 | ORAL_TABLET | ORAL | Status: DC | PRN

## 2015-07-08 MED ORDER — DIPHENHYDRAMINE HCL 50 MG/ML IJ SOLN
25.0000 mg | Freq: Once | INTRAMUSCULAR | Status: AC
  Administered 2015-07-08: 25 mg via INTRAVENOUS
  Filled 2015-07-08: qty 1

## 2015-07-08 MED ORDER — IOHEXOL 300 MG/ML  SOLN
100.0000 mL | Freq: Once | INTRAMUSCULAR | Status: AC | PRN
  Administered 2015-07-08: 100 mL via INTRAVENOUS

## 2015-07-08 NOTE — ED Provider Notes (Signed)
Pt developed some eyelid swelling after CT. Given benadryl and observed for a couple more hours. Has not resolved but no further symptoms. At this time, I feel [name] is appropriate for discharge. Discussed CT findings. May potentially be source of pain. No emergent intervention required. PRN pain meds. GYN FU.   Virgel Manifold, MD 07/08/15 [phone]

## 2015-07-08 NOTE — ED Notes (Signed)
When pt came back from CT, her eyes were swollen. Pt states they are itchy. Dr. Wilson Singer was notified and  A verbal order for benadryl was given- 25mg . Pt has a clear and maintained airway

## 2015-07-08 NOTE — Discharge Instructions (Signed)

## 2015-07-17 ENCOUNTER — Emergency Department (HOSPITAL_COMMUNITY)
Admission: EM | Admit: 2015-07-17 | Discharge: 2015-07-18 | Disposition: A | Discharge status: Other Acute Inpatient Hospital | Payer: Medicaid Other | Attending: Emergency Medicine | Admitting: Emergency Medicine

## 2015-07-17 ENCOUNTER — Encounter (HOSPITAL_COMMUNITY): Payer: Self-pay | Admitting: Emergency Medicine

## 2015-07-17 DIAGNOSIS — Z7984 Long term (current) use of oral hypoglycemic drugs: Secondary | ICD-10-CM | POA: Diagnosis not present

## 2015-07-17 DIAGNOSIS — R45851 Suicidal ideations: Secondary | ICD-10-CM | POA: Insufficient documentation

## 2015-07-17 DIAGNOSIS — Z880 Allergy status to penicillin: Secondary | ICD-10-CM | POA: Insufficient documentation

## 2015-07-17 DIAGNOSIS — F17210 Nicotine dependence, cigarettes, uncomplicated: Secondary | ICD-10-CM | POA: Diagnosis not present

## 2015-07-17 DIAGNOSIS — Z3202 Encounter for pregnancy test, result negative: Secondary | ICD-10-CM | POA: Insufficient documentation

## 2015-07-17 DIAGNOSIS — R000 Tachycardia, unspecified: Secondary | ICD-10-CM | POA: Insufficient documentation

## 2015-07-17 DIAGNOSIS — Z792 Long term (current) use of antibiotics: Secondary | ICD-10-CM | POA: Diagnosis not present

## 2015-07-17 DIAGNOSIS — E1165 Type 2 diabetes mellitus with hyperglycemia: Secondary | ICD-10-CM | POA: Diagnosis not present

## 2015-07-17 DIAGNOSIS — F419 Anxiety disorder, unspecified: Secondary | ICD-10-CM | POA: Insufficient documentation

## 2015-07-17 DIAGNOSIS — F319 Bipolar disorder, unspecified: Secondary | ICD-10-CM | POA: Diagnosis not present

## 2015-07-17 DIAGNOSIS — Z79899 Other long term (current) drug therapy: Secondary | ICD-10-CM | POA: Diagnosis not present

## 2015-07-17 DIAGNOSIS — Z046 Encounter for general psychiatric examination, requested by authority: Secondary | ICD-10-CM | POA: Diagnosis present

## 2015-07-17 DIAGNOSIS — Z791 Long term (current) use of non-steroidal anti-inflammatories (NSAID): Secondary | ICD-10-CM | POA: Insufficient documentation

## 2015-07-17 DIAGNOSIS — R739 Hyperglycemia, unspecified: Secondary | ICD-10-CM

## 2015-07-17 LAB — COMPREHENSIVE METABOLIC PANEL
ALT: 40 U/L (ref 14–54)
ANION GAP: 10 (ref 5–15)
AST: 32 U/L (ref 15–41)
Albumin: 4.4 g/dL (ref 3.5–5.0)
Alkaline Phosphatase: 63 U/L (ref 38–126)
BILIRUBIN TOTAL: 0.5 mg/dL (ref 0.3–1.2)
BUN: 12 mg/dL (ref 6–20)
CHLORIDE: 102 mmol/L (ref 101–111)
CO2: 25 mmol/L (ref 22–32)
Calcium: 9.7 mg/dL (ref 8.9–10.3)
Creatinine, Ser: 0.83 mg/dL (ref 0.44–1.00)
Glucose, Bld: 233 mg/dL — ABNORMAL HIGH (ref 65–99)
POTASSIUM: 4.1 mmol/L (ref 3.5–5.1)
Sodium: 137 mmol/L (ref 135–145)
TOTAL PROTEIN: 7.8 g/dL (ref 6.5–8.1)

## 2015-07-17 LAB — CBC WITH DIFFERENTIAL/PLATELET
BASOS ABS: 0 10*3/uL (ref 0.0–0.1)
BASOS PCT: 0 %
Eosinophils Absolute: 0.5 10*3/uL (ref 0.0–0.7)
Eosinophils Relative: 6 %
HEMATOCRIT: 41.9 % (ref 36.0–46.0)
Hemoglobin: 13.1 g/dL (ref 12.0–15.0)
LYMPHS PCT: 37 %
Lymphs Abs: 2.9 10*3/uL (ref 0.7–4.0)
MCH: 25.9 pg — ABNORMAL LOW (ref 26.0–34.0)
MCHC: 31.3 g/dL (ref 30.0–36.0)
MCV: 83 fL (ref 78.0–100.0)
MONO ABS: 0.5 10*3/uL (ref 0.1–1.0)
Monocytes Relative: 6 %
NEUTROS ABS: 4 10*3/uL (ref 1.7–7.7)
Neutrophils Relative %: 51 %
PLATELETS: 255 10*3/uL (ref 150–400)
RBC: 5.05 MIL/uL (ref 3.87–5.11)
RDW: 14.1 % (ref 11.5–15.5)
WBC: 7.9 10*3/uL (ref 4.0–10.5)

## 2015-07-17 LAB — URINALYSIS, ROUTINE W REFLEX MICROSCOPIC
BILIRUBIN URINE: NEGATIVE
HGB URINE DIPSTICK: NEGATIVE
KETONES UR: NEGATIVE mg/dL
Leukocytes, UA: NEGATIVE
Nitrite: NEGATIVE
PROTEIN: NEGATIVE mg/dL
Specific Gravity, Urine: 1.029 (ref 1.005–1.030)
pH: 6 (ref 5.0–8.0)

## 2015-07-17 LAB — URINE MICROSCOPIC-ADD ON

## 2015-07-17 LAB — RAPID URINE DRUG SCREEN, HOSP PERFORMED
AMPHETAMINES: NOT DETECTED
BARBITURATES: NOT DETECTED
BENZODIAZEPINES: NOT DETECTED
COCAINE: NOT DETECTED
Opiates: NOT DETECTED
Tetrahydrocannabinol: NOT DETECTED

## 2015-07-17 LAB — ACETAMINOPHEN LEVEL: Acetaminophen (Tylenol), Serum: <10 ug/mL — ABNORMAL LOW (ref 10–30)

## 2015-07-17 LAB — ETHANOL

## 2015-07-17 LAB — POC URINE PREG, ED: PREG TEST UR: NEGATIVE

## 2015-07-17 LAB — SALICYLATE LEVEL: Salicylate Lvl: <4 mg/dL (ref 2.8–30.0)

## 2015-07-17 LAB — CBG MONITORING, ED
GLUCOSE-CAPILLARY: 233 mg/dL — AB (ref 65–99)
Glucose-Capillary: 240 mg/dL — ABNORMAL HIGH (ref 65–99)

## 2015-07-17 MED ORDER — SODIUM CHLORIDE 0.9 % IV BOLUS (SEPSIS): 1000.0000 mL | Freq: Once | INTRAVENOUS | Status: DC

## 2015-07-17 MED ORDER — METFORMIN HCL 500 MG PO TABS
500.0000 mg | ORAL_TABLET | Freq: Once | ORAL | Status: AC
  Administered 2015-07-17: 500 mg via ORAL
  Filled 2015-07-17: qty 1

## 2015-07-17 NOTE — ED Notes (Signed)
Per IVC paper work states she is having suicidal thoughts-was at [facility] for 2 days sent here for low CBG

## 2015-07-17 NOTE — ED Notes (Signed)
Bed: [id number] Expected date:  Expected time:  Means of arrival:  Comments: Triage 3 
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
## 2015-07-17 NOTE — ED Provider Notes (Signed)
Request is made for EMTALA for transfer to behavioral health. Dr. Sabra Heck is the accepting. The patient has been assessed. She is alert and nontoxic. She is sitting at the edge the stretcher. She is pleasant and cooperative. Patient is in no acute distress. She's been taking fluids. She denies any pain, recent fever or other acute illness. She reports she has just restarted her metformin this morning. She reports she had missed several days worth of doses. At this time, her evening dose metformin will be provided. Patient is otherwise well appearance. No signs of decompensated medical illness.  Charlesetta Shanks, MD 07/17/15 2237

## 2015-07-17 NOTE — BH Assessment (Signed)
[place] Assessment Progress Note   Pt accepted to [facility] 506-2 to services of Dr. Budd Palmer.  Letitia Libra, [facility] said patient is welcome after 22:30.  Clinician informed nurse Rosezella Rumpf of patient acceptance.  Pt is on IVC.
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
## 2015-07-17 NOTE — BH Assessment (Addendum)
Assessment Note  [name] is an 25 y.o. female. Patient presents to [facility] from [facility] with IVC papers. Patient sts that she was brought to [facility] for medical issues (elevated CBG's). Furthermore, patient sts that she missed 1 day of her medication regimen which included Cymbalta, Buspar, & Luvox.  She sts that not taking her medications for 1 day caused her to feel depressed and suicidal. She states that she was held at  [facility] for the past 2 days awaiting psychiatric bed placement.  IVC papers from [place] state: "Respondent was brought to [place] by [facility] PD. She is experiencing periods of aggitation while at the [place], with loud arguing on the phone. She reporst she has had feelings of suicide with plans to overdose. She reports paranoid thoughts about her boyfriend and bus driver, whom she believes is sending people to fight her. She has had multiple psychiatric hospitalizations. She is considered a threat to herself. "  Today patient denies current SI. No plan/ No intent. She does report a history of suicide attempts (4 overdoses). The previous suicide attempts were all triggered by conflict with friends. She denies self mutilating behaviors. She denies current depressive symptoms. She denies current anxiety. However, admits to "bad anxiety" when not compliant with her medications. Patient denies a family history of mental health illness.   Patient denies HI. She is calm and cooperative. No legal issues. She denies AVH's. Patient does not appear to be responding to internal stimuli. Patient denies alcohol and drug use.   Patient has received inpatient treatment at [facility] approx. 5x's in the past. Patient's last admission to [facility] was January 2016. Patient receives outpatient mental health services with [facility].   Diagnosis: Bipolar Disorder, Anxiety, Depressive Disorder  Past Medical History:  Past Medical History  Diagnosis Date  . Anxiety   .  Mental disorder   . Depression   . Cognitive deficits   . Bipolar 1 disorder ([place])   . Diabetes mellitus without complication ([place])   . Obesity     Past Surgical History  Procedure Laterality Date  . Cesarean section    . Tonsillectomy N/A 06/03/2012    Procedure: TONSILLECTOMY;  Surgeon: [name], MD;  Location: [place];  Service: ENT;  Laterality: N/A;  . Mass excision N/A 06/03/2012    Procedure: EXCISION MASS;  Surgeon: [name], MD;  Location: [place];  Service: ENT;  Laterality: N/A;  Excision uvula mass  . Cesarean section N/A 04/25/2013    Procedure: REPEAT CESAREAN SECTION;  Surgeon: [name], MD;  Location: [facility];  Service: Obstetrics;  Laterality: N/A;  . Tonsillectomy      Family History:  Family History  Problem Relation Age of Onset  . Hypertension Mother   . Diabetes Father     Social History:  reports that she has been smoking Cigarettes.  She has a 3.5 pack-year smoking history. She has never used smokeless tobacco. She reports that she does not drink alcohol or use illicit drugs.  Additional Social History:  Alcohol / Drug Use Pain Medications: SEE MAR Prescriptions: SEE MAR Over the Counter: SEE MAR History of alcohol / drug use?: No history of alcohol / drug abuse  CIWA:   COWS:      Home Medications:  (Not in a hospital admission)  OB/GYN Status:  No LMP recorded. Patient has had an implant.  General Assessment Data Location of Assessment: [facility] ED TTS Assessment: In system Is  this a Tele or Face-to-Face Assessment?: Face-to-Face Is this an Initial Assessment or a Re-assessment for this encounter?: Initial Assessment Marital status: Single Maiden name:  (n/a) Is patient pregnant?: No Pregnancy Status: No Living Arrangements: Parent [name] [phone]) Can pt return to current living arrangement?: Yes Admission Status: Involuntary Is patient capable of signing voluntary  admission?: Yes Referral Source: Self/Family/Friend Insurance type:  (Medicaid)     Crisis Care Plan Living Arrangements: Parent [name] [phone]) Legal Guardian:  (no guardian ) Name of Psychiatrist:  ([facility]) Name of Therapist:  ([facility])  Education Status Is patient currently in school?: No Current Grade: n/a Highest grade of school patient has completed:  (11th grade) Name of school:  (n/a) Contact person:  (n/a)  Risk to self with the past 6 months Suicidal Ideation: No Has patient been a risk to self within the past 6 months prior to admission? : No Suicidal Intent: No Has patient had any suicidal intent within the past 6 months prior to admission? : No Is patient at risk for suicide?: No Suicidal Plan?: No Has patient had any suicidal plan within the past 6 months prior to admission? : No Access to Means: No What has been your use of drugs/alcohol within the last 12 months?:  (denies ) Previous Attempts/Gestures: Yes How many times?:  (4x's-overdoses) Other Self Harm Risks:  (patient denies ) Triggers for Past Attempts: Other (Comment) ("issues with friends") Intentional Self Injurious Behavior: None Family Suicide History: Unknown Recent stressful life event(s): Other (Comment) (patient denies stressors) Persecutory voices/beliefs?: No Depression: No Depression Symptoms:  (patient denies ) Substance abuse history and/or treatment for substance abuse?: No Suicide prevention information given to non-admitted patients: Not applicable  Risk to Others within the past 6 months Homicidal Ideation: No Does patient have any lifetime risk of violence toward others beyond the six months prior to admission? : No Thoughts of Harm to Others: No Current Homicidal Intent: No Current Homicidal Plan: No Access to Homicidal Means: No Identified Victim:  (n/a) History of harm to others?: No Assessment of Violence:  None Noted Violent Behavior Description:  (patient is calm and cooperative) Does patient have access to weapons?: No Criminal Charges Pending?: No Does patient have a court date: No Is patient on probation?: No  Psychosis Hallucinations: None noted (patient denies AVH) Delusions: None noted  Mental Status Report Appearance/Hygiene: Other (Comment), Unremarkable (appropriate ) Eye Contact: Good Motor Activity: Freedom of movement Speech: Logical/coherent Level of Consciousness: Alert Mood: Other (Comment) (appropriate) Affect: Appropriate to circumstance Anxiety Level:  ("Only if I am off my medications") Thought Processes: Coherent, Relevant Judgement: Unimpaired Orientation: Person, Place, Time, Situation Obsessive Compulsive Thoughts/Behaviors: None  Cognitive Functioning Concentration: Decreased Memory: Recent Intact, Remote Intact IQ: Average Insight: Fair Impulse Control: Good Appetite: Good Weight Loss:  (n/a) Weight Gain:  (n/a) Sleep: No Change Total Hours of Sleep:  (7.5-8 hrs per night) Vegetative Symptoms: None  ADLScreening [facility] Assessment Services) Patient's cognitive ability adequate to safely complete daily activities?: Yes Patient able to express need for assistance with ADLs?: Yes Independently performs ADLs?: Yes (appropriate for developmental age)  Prior Inpatient Therapy Prior Inpatient Therapy: Yes Prior Therapy Facilty/Provider(s):  ([facility]-5x's) Reason for Treatment:  (suicide attempts; depression; anxiety;med management)  Prior Outpatient Therapy Prior Outpatient Therapy: Yes Prior Therapy Dates:  (current) Prior Therapy Facilty/Provider(s):  ([facility] ) Reason for Treatment:  (med managment ) Does patient have an ACCT team?: No Does patient have Intensive In-House  Services?  : No Does patient have [facility] services? : No Does patient have P4CC services?: No  ADL Screening (condition at time of  admission) Patient's cognitive ability adequate to safely complete daily activities?: Yes Is the patient deaf or have difficulty hearing?: No Does the patient have difficulty seeing, even when wearing glasses/contacts?: No Does the patient have difficulty concentrating, remembering, or making decisions?: No Patient able to express need for assistance with ADLs?: Yes Does the patient have difficulty dressing or bathing?: No Independently performs ADLs?: Yes (appropriate for developmental age) Does the patient have difficulty walking or climbing stairs?: No Weakness of Legs: None Weakness of Arms/Hands: None  Home Assistive Devices/Equipment Home Assistive Devices/Equipment: None    Abuse/Neglect Assessment (Assessment to be complete while patient is alone) Physical Abuse: Denies Verbal Abuse: Denies Sexual Abuse: Denies Exploitation of patient/patient's resources: Denies Self-Neglect: Denies Values / Beliefs Cultural Requests During Hospitalization: None Spiritual Requests During Hospitalization: None   Advance Directives (For Healthcare) Does patient have an advance directive?: No Would patient like information on creating an advanced directive?: No - patient declined information    Additional Information 1:1 In Past 12 Months?: No CIRT Risk: No Elopement Risk: No Does patient have medical clearance?: Yes     Disposition:  Disposition Initial Assessment Completed for this Encounter: Yes   Per Reginold Agent, NP patient to remain in the ED overnight. Psychiatry will re-evaluate in the morning.   On Site Evaluation by:   Reviewed with Physician:    Waldon Merl [facility] 07/17/2015 4:02 PM

## 2015-07-17 NOTE — ED Notes (Signed)
Pt at the time of assessment continues to be paranoid; state, "there is this bus driver that sent people to follow me everywhere I go; I see them whenever I enter the bus. Pt however, denies any depression, anxiety, pain, SI, HI or AVH; state, "I feel safer here; I feel good." Pt remained calm and cooperative. Pt has been accepted at [facility]. Support, encouragement, and safe environment provided. Will continue to monitor for safety via security cameras and Q 15 minute checks.

## 2015-07-18 ENCOUNTER — Encounter (HOSPITAL_COMMUNITY): Payer: Self-pay | Admitting: *Deleted

## 2015-07-18 ENCOUNTER — Inpatient Hospital Stay (HOSPITAL_COMMUNITY)
Admission: AD | Admit: 2015-07-18 | Discharge: 2015-07-23 | DRG: 885 | Disposition: A | Discharge status: Home / Self Care | Payer: Medicaid Other | Source: Intra-hospital | Attending: Psychiatry | Admitting: Psychiatry

## 2015-07-18 DIAGNOSIS — F411 Generalized anxiety disorder: Secondary | ICD-10-CM | POA: Diagnosis present

## 2015-07-18 DIAGNOSIS — Z915 Personal history of self-harm: Secondary | ICD-10-CM

## 2015-07-18 DIAGNOSIS — F639 Impulse disorder, unspecified: Secondary | ICD-10-CM | POA: Diagnosis not present

## 2015-07-18 DIAGNOSIS — E785 Hyperlipidemia, unspecified: Secondary | ICD-10-CM | POA: Diagnosis present

## 2015-07-18 DIAGNOSIS — F819 Developmental disorder of scholastic skills, unspecified: Secondary | ICD-10-CM

## 2015-07-18 DIAGNOSIS — Z79899 Other long term (current) drug therapy: Secondary | ICD-10-CM | POA: Diagnosis not present

## 2015-07-18 DIAGNOSIS — F333 Major depressive disorder, recurrent, severe with psychotic symptoms: Principal | ICD-10-CM | POA: Diagnosis present

## 2015-07-18 DIAGNOSIS — E7800 Pure hypercholesterolemia, unspecified: Secondary | ICD-10-CM | POA: Diagnosis present

## 2015-07-18 DIAGNOSIS — Z7984 Long term (current) use of oral hypoglycemic drugs: Secondary | ICD-10-CM

## 2015-07-18 DIAGNOSIS — R4183 Borderline intellectual functioning: Secondary | ICD-10-CM | POA: Diagnosis not present

## 2015-07-18 DIAGNOSIS — Z8249 Family history of ischemic heart disease and other diseases of the circulatory system: Secondary | ICD-10-CM | POA: Diagnosis not present

## 2015-07-18 DIAGNOSIS — Z833 Family history of diabetes mellitus: Secondary | ICD-10-CM

## 2015-07-18 DIAGNOSIS — F332 Major depressive disorder, recurrent severe without psychotic features: Secondary | ICD-10-CM | POA: Diagnosis present

## 2015-07-18 DIAGNOSIS — F17210 Nicotine dependence, cigarettes, uncomplicated: Secondary | ICD-10-CM | POA: Diagnosis present

## 2015-07-18 DIAGNOSIS — F312 Bipolar disorder, current episode manic severe with psychotic features: Secondary | ICD-10-CM | POA: Diagnosis present

## 2015-07-18 DIAGNOSIS — Z7951 Long term (current) use of inhaled steroids: Secondary | ICD-10-CM

## 2015-07-18 DIAGNOSIS — E119 Type 2 diabetes mellitus without complications: Secondary | ICD-10-CM | POA: Diagnosis present

## 2015-07-18 DIAGNOSIS — R45851 Suicidal ideations: Secondary | ICD-10-CM | POA: Diagnosis present

## 2015-07-18 LAB — LIPID PANEL
Cholesterol: 212 mg/dL — ABNORMAL HIGH (ref 0–200)
HDL: 40 mg/dL — ABNORMAL LOW (ref >40)
LDL CALC: UNDETERMINED mg/dL (ref 0–99)
TRIGLYCERIDES: 496 mg/dL — AB (ref <150)
Total CHOL/HDL Ratio: 5.3 RATIO
VLDL: UNDETERMINED mg/dL (ref 0–40)

## 2015-07-18 LAB — GLUCOSE, CAPILLARY
GLUCOSE-CAPILLARY: 188 mg/dL — AB (ref 65–99)
GLUCOSE-CAPILLARY: 293 mg/dL — AB (ref 65–99)
Glucose-Capillary: 245 mg/dL — ABNORMAL HIGH (ref 65–99)
Glucose-Capillary: 260 mg/dL — ABNORMAL HIGH (ref 65–99)

## 2015-07-18 LAB — TSH: TSH: 3.197 u[IU]/mL (ref 0.350–4.500)

## 2015-07-18 MED ORDER — INSULIN ASPART 100 UNIT/ML ~~LOC~~ SOLN
0.0000 [IU] | Freq: Three times a day (TID) | SUBCUTANEOUS | Status: DC
Start: 2015-07-18 — End: 2015-07-23
  Administered 2015-07-18: 5 [IU] via SUBCUTANEOUS
  Administered 2015-07-18 – 2015-07-19 (×2): 8 [IU] via SUBCUTANEOUS
  Administered 2015-07-19: 3 [IU] via SUBCUTANEOUS
  Administered 2015-07-19: 11 [IU] via SUBCUTANEOUS
  Administered 2015-07-20: 3 [IU] via SUBCUTANEOUS
  Administered 2015-07-20 (×2): 8 [IU] via SUBCUTANEOUS
  Administered 2015-07-21 (×2): 5 [IU] via SUBCUTANEOUS
  Administered 2015-07-21: 3 [IU] via SUBCUTANEOUS
  Administered 2015-07-22: 8 [IU] via SUBCUTANEOUS
  Administered 2015-07-22: 3 [IU] via SUBCUTANEOUS
  Administered 2015-07-22: 5 [IU] via SUBCUTANEOUS
  Administered 2015-07-23: 8 [IU] via SUBCUTANEOUS

## 2015-07-18 MED ORDER — RISPERIDONE 2 MG PO TBDP
2.0000 mg | ORAL_TABLET | Freq: Three times a day (TID) | ORAL | Status: DC | PRN
  Administered 2015-07-19 – 2015-07-21 (×3): 2 mg via ORAL
  Filled 2015-07-18 (×3): qty 2

## 2015-07-18 MED ORDER — BENZTROPINE MESYLATE 0.5 MG PO TABS
0.5000 mg | ORAL_TABLET | Freq: Every day | ORAL | Status: DC
  Administered 2015-07-18 – 2015-07-22 (×5): 0.5 mg via ORAL
  Filled 2015-07-18 (×6): qty 1

## 2015-07-18 MED ORDER — LORAZEPAM 1 MG PO TABS
1.0000 mg | ORAL_TABLET | ORAL | Status: AC | PRN
  Administered 2015-07-19: 1 mg via ORAL
  Filled 2015-07-18: qty 1

## 2015-07-18 MED ORDER — TRAZODONE HCL 50 MG PO TABS
50.0000 mg | ORAL_TABLET | Freq: Every evening | ORAL | Status: DC | PRN
  Administered 2015-07-18 – 2015-07-22 (×6): 50 mg via ORAL
  Filled 2015-07-18 (×13): qty 1

## 2015-07-18 MED ORDER — INSULIN ASPART 100 UNIT/ML ~~LOC~~ SOLN
0.0000 [IU] | Freq: Every day | SUBCUTANEOUS | Status: DC
  Administered 2015-07-18: 3 [IU] via SUBCUTANEOUS
  Administered 2015-07-20 – 2015-07-22 (×3): 2 [IU] via SUBCUTANEOUS

## 2015-07-18 MED ORDER — BUSPIRONE HCL 15 MG PO TABS
15.0000 mg | ORAL_TABLET | Freq: Three times a day (TID) | ORAL | Status: DC
  Administered 2015-07-18 – 2015-07-23 (×16): 15 mg via ORAL
  Filled 2015-07-18 (×18): qty 1

## 2015-07-18 MED ORDER — HALOPERIDOL 5 MG PO TABS
5.0000 mg | ORAL_TABLET | Freq: Every day | ORAL | Status: DC
  Administered 2015-07-18 – 2015-07-21 (×4): 5 mg via ORAL
  Filled 2015-07-18 (×5): qty 1

## 2015-07-18 MED ORDER — SIMVASTATIN 20 MG PO TABS
20.0000 mg | ORAL_TABLET | Freq: Every day | ORAL | Status: DC
  Administered 2015-07-18 – 2015-07-22 (×5): 20 mg via ORAL
  Filled 2015-07-18 (×7): qty 1

## 2015-07-18 MED ORDER — DULOXETINE HCL 30 MG PO CPEP
30.0000 mg | ORAL_CAPSULE | Freq: Every day | ORAL | Status: DC
  Administered 2015-07-18: 30 mg via ORAL
  Filled 2015-07-18 (×2): qty 1

## 2015-07-18 MED ORDER — FLUVOXAMINE MALEATE 50 MG PO TABS
50.0000 mg | ORAL_TABLET | Freq: Every morning | ORAL | Status: DC
  Administered 2015-07-18: 50 mg via ORAL
  Filled 2015-07-18 (×2): qty 1

## 2015-07-18 MED ORDER — OLANZAPINE 2.5 MG PO TABS
2.5000 mg | ORAL_TABLET | Freq: Every day | ORAL | Status: DC
  Administered 2015-07-18: 2.5 mg via ORAL
  Filled 2015-07-18 (×3): qty 1

## 2015-07-18 MED ORDER — METFORMIN HCL 500 MG PO TABS
500.0000 mg | ORAL_TABLET | Freq: Two times a day (BID) | ORAL | Status: DC
  Administered 2015-07-18 – 2015-07-23 (×11): 500 mg via ORAL
  Filled 2015-07-18 (×13): qty 1

## 2015-07-18 MED ORDER — ZIPRASIDONE MESYLATE 20 MG IM SOLR
20.0000 mg | INTRAMUSCULAR | Status: DC | PRN
  Filled 2015-07-18: qty 20

## 2015-07-18 MED ORDER — FLUVOXAMINE MALEATE 50 MG PO TABS
50.0000 mg | ORAL_TABLET | ORAL | Status: DC
  Administered 2015-07-18 – 2015-07-22 (×8): 50 mg via ORAL
  Filled 2015-07-18 (×10): qty 1

## 2015-07-18 MED ORDER — ACETAMINOPHEN 325 MG PO TABS: 650.0000 mg | ORAL_TABLET | Freq: Four times a day (QID) | ORAL | Status: DC | PRN

## 2015-07-18 MED ORDER — ALUM & MAG HYDROXIDE-SIMETH 200-200-20 MG/5ML PO SUSP: 30.0000 mL | ORAL | Status: DC | PRN

## 2015-07-18 MED ORDER — MAGNESIUM HYDROXIDE 400 MG/5ML PO SUSP: 30.0000 mL | Freq: Every day | ORAL | Status: DC | PRN

## 2015-07-18 MED ORDER — LORATADINE 10 MG PO TABS
10.0000 mg | ORAL_TABLET | Freq: Every day | ORAL | Status: DC
  Administered 2015-07-18 – 2015-07-23 (×6): 10 mg via ORAL
  Filled 2015-07-18 (×8): qty 1

## 2015-07-18 MED ORDER — FLUVOXAMINE MALEATE 50 MG PO TABS: 50.0000 mg | ORAL_TABLET | Freq: Two times a day (BID) | ORAL | Status: DC

## 2015-07-18 MED ORDER — FLUTICASONE PROPIONATE 50 MCG/ACT NA SUSP
2.0000 | Freq: Every day | NASAL | Status: DC
  Administered 2015-07-18 – 2015-07-23 (×6): 2 via NASAL
  Filled 2015-07-18 (×2): qty 16

## 2015-07-18 MED ORDER — GLIPIZIDE ER 5 MG PO TB24
5.0000 mg | ORAL_TABLET | Freq: Every day | ORAL | Status: DC
  Administered 2015-07-18 – 2015-07-23 (×6): 5 mg via ORAL
  Filled 2015-07-18 (×7): qty 1

## 2015-07-18 NOTE — Progress Notes (Signed)
Adult Psychoeducational Group Note  Date:  07/18/2015 Time:  8:10 PM  Group Topic/Focus:  Wrap-Up Group:   The focus of this group is to help patients review their daily goal of treatment and discuss progress on daily workbooks.  Participation Level:  Did Not Attend  Pt was asleep during wrap-up group.    Lincoln Brigham 07/18/2015, 8:54 PM

## 2015-07-18 NOTE — H&P (Addendum)
Psychiatric Admission Assessment Adult  Patient Identification: [name] MRN:  [id number] Date of Evaluation:  07/18/2015 Chief Complaint: " Nothing is working."   Principal Diagnosis: Major Depression, recurrent, severe , with psychotic features  Diagnosis:   Patient Active Problem List   Diagnosis Date Noted  . 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 Learning disability [F81.9] 07/18/2015  . 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 Diabetes mellitus ([place]) [E11.9] 07/18/2015  . MDD (major depressive disorder), recurrent, severe, with psychosis ([place]) [F33.3] 07/18/2015  . Drug overdose [T50.901A]   . Cognitive deficits [R41.89] 10/12/2012  . Generalized anxiety disorder [F41.1] 06/28/2012       History of Present Illness:: [name] is a 25 y.o. [name], who has a hx of MDD, impulse control disorder as well as ?mild /moderate ID and learning disability , lives with her mother in [place] , who presented to [facility] from [place] with IVC papers.   Per initial notes in EHR " Patient stated that she was brought to [facility] for medical issues (elevated CBG's). Furthermore, patient sts that she missed 1 day of her medication regimen which included Cymbalta, Buspar, & Luvox. She stated that she was not taking her medications for 1 day caused her to feel depressed and suicidal. She stated  that she was held at [facility] for the past 2 days awaiting psychiatric bed placement. IVC papers from [place] state: "Respondent was brought to [place] by [facility] PD. She is experiencing periods of agitation while at the [place], with loud arguing on the phone. She reports she has had feelings of suicide with plans to overdose. She reports paranoid thoughts about her boyfriend and bus driver, whom she believes is sending people to fight her. She has had multiple psychiatric hospitalizations. She is considered a threat to herself. "     Patient seen and chart reviewed  today .Discussed patient with treatment team. Pt today is seen as depressed, anxious , states her cymbalta is not working, but she wants to stay on her Luvox since she has impulse control problems and has a hx of cutting her hair off. Pt reports that she has been having a lot of negative thoughts as well as anxiety about her boyfriend , whom she believes has been visiting his ex wife very often. This thought is giving her a lot of stress. Pt reports she also has a lot of anxiety sx, anxiety attacks at times due to the same. Pt appears to have a lot of racing thoughts as well as paranoia . Pt also reports having nightmares about her children who were adopted.  Pt reports she has been hospitalized multiple times in the past , follows [facility]. Pt also has learning disability as well as ?ID - unknown what severity , however seems to be limited cognitively on evaluation.       Associated Signs/Symptoms: Depression Symptoms:  depressed mood, anhedonia, suicidal thoughts with specific plan, anxiety, loss of energy/fatigue, (Hypo) Manic Symptoms:  Denies  Anxiety Symptoms:anxiety attacks Psychotic Symptoms: paranoia PTSD Symptoms: Denies  Total Time spent with patient: 45 minutes  Past Psychiatric History:  Several prior psychiatric admissions, most recently January 2017 . Prior history of suicide attempts by overdosing.  History of self cutting . Denies history of psychosis. Chart notes indicate prior history of Bipolar Disorder, but at this time patient is not endorsing any history of mania or hypomania.  Denies history of violence. [facility].  Risk to Self: Is patient at risk for suicide?: No Risk to Others:   Prior Inpatient Therapy:  see above Prior Outpatient Therapy:  see above  Alcohol Screening: Patient refused Alcohol Screening Tool: Yes 1. How often do you have a drink containing alcohol?: Never 9. Have you or  someone else been injured as a result of your drinking?: No 10. Has a relative or friend or a doctor or another health worker been concerned about your drinking or suggested you cut down?: No Alcohol Use Disorder Identification Test Final Score (AUDIT): 0 Brief Intervention: Patient declined brief intervention Substance Abuse History in the last 12 months:   Denies drug or alcohol abuse  Consequences of Substance Abuse: Denies  Previous Psychotropic Medications: Cymbalta, Buspar, Invega x 2 years.  Also remembers being on Wellbutrin, Seroquel, Lithium in the past . States that historically Cymbalta, Invega have been more effective .  Psychological Evaluations:   No  Past Medical History:  States she has DM, denies HTN, allergic to PCN, smokes only occasionally . Past Medical History  Diagnosis Date  . Anxiety   . Mental disorder   . Depression   . Cognitive deficits   . Bipolar 1 disorder ([place])   . Diabetes mellitus without complication ([place])   . Obesity     Past Surgical History  Procedure Laterality Date  . Cesarean section    . Tonsillectomy N/A 06/03/2012    Procedure: TONSILLECTOMY;  Surgeon: [name], MD;  Location: [facility] [address];  Service: ENT;  Laterality: N/A;  . Mass excision N/A 06/03/2012    Procedure: EXCISION MASS;  Surgeon: [name], MD;  Location: [place];  Service: ENT;  Laterality: N/A;  Excision uvula mass  . Cesarean section N/A 04/25/2013    Procedure: REPEAT CESAREAN SECTION;  Surgeon: [name], MD;  Location: [facility];  Service: Obstetrics;  Laterality: N/A;  . Tonsillectomy     Family History:  Family History  Problem Relation Age of Onset  . Hypertension Mother   . Diabetes Father    Family Psychiatric  History:  Per EHR "States that aunt has history of " nervous breakdowns " but cannot specify,denies history of suicides in family, no alcohol or drug abuse in family. Tobacco use : 1 ppd, offered nicotine  patch Social History: single, lives with mother, has three children- ages 6,6,2, states that they were adopted out 1.5 years ago. Has a boyfriend, who is actively using drugs, which is another stressor for patient. Denies legal issues, on disability.  History  Alcohol Use No     History  Drug Use No    Comment: Patient denies    Social History   Social History  . Marital Status: Single    Spouse Name: N/A  . Number of Children: N/A  . Years of Education: N/A   Social History Main Topics  . Smoking status: Current Every Day Smoker -- 0.50 packs/day for 7 years    Types: Cigarettes  . Smokeless tobacco: Never Used  . Alcohol Use: No  . Drug Use: No     Comment: Patient denies  . Sexual Activity: No   Other Topics Concern  . None   Social History Narrative   Additional Social History: Allergies:  PCN  Results for orders placed or performed during the hospital encounter of 07/18/15 (from the past 48 hour(s))  Glucose, capillary     Status: Abnormal   Collection Time: 07/18/15  6:17 AM  Result  Value Ref Range   Glucose-Capillary 188 (H) 65 - 99 mg/dL   Comment 1 Notify RN   Lipid panel, fasting     Status: Abnormal   Collection Time: 07/18/15  6:44 AM  Result Value Ref Range   Cholesterol 212 (H) 0 - 200 mg/dL   Triglycerides 496 (H) <150 mg/dL   HDL 40 (L) >40 mg/dL   Total CHOL/HDL Ratio 5.3 RATIO   VLDL UNABLE TO CALCULATE IF TRIGLYCERIDE OVER 400 mg/dL 0 - 40 mg/dL   LDL Cholesterol UNABLE TO CALCULATE IF TRIGLYCERIDE OVER 400 mg/dL 0 - 99 mg/dL    Comment:        Total Cholesterol/HDL:CHD Risk Coronary Heart Disease Risk Table                     Men   Women  1/2 Average Risk   3.4   3.3  Average Risk       5.0   4.4  2 X Average Risk   9.6   7.1  3 X Average Risk  23.4   11.0        Use the calculated Patient Ratio above and the CHD Risk Table to determine the patient's CHD Risk.        ATP III CLASSIFICATION (LDL):  <100     mg/dL   Optimal  100-129   mg/dL   Near or Above                    Optimal  130-159  mg/dL   Borderline  160-189  mg/dL   High  >190     mg/dL   Very High Performed at [facility]   TSH     Status: None   Collection Time: 07/18/15  6:44 AM  Result Value Ref Range   TSH 3.197 0.350 - 4.500 uIU/mL    Comment: Performed at [facility]  Glucose, capillary     Status: Abnormal   Collection Time: 07/18/15 11:29 AM  Result Value Ref Range   Glucose-Capillary 293 (H) 65 - 99 mg/dL    Metabolic Disorder Labs:  Lab Results  Component Value Date   HGBA1C 7.4* 05/11/2015   MPG 166 05/11/2015   Lab Results  Component Value Date   PROLACTIN 42.0* 05/11/2015   Lab Results  Component Value Date   CHOL 212* 07/18/2015   TRIG 496* 07/18/2015   HDL 40* 07/18/2015   CHOLHDL 5.3 07/18/2015   VLDL UNABLE TO CALCULATE IF TRIGLYCERIDE OVER 400 mg/dL 07/18/2015   LDLCALC UNABLE TO CALCULATE IF TRIGLYCERIDE OVER 400 mg/dL 07/18/2015   LDLCALC 116* 05/11/2015    Current Medications: Current Facility-Administered Medications  Medication Dose Route Frequency Provider Last Rate Last Dose  . acetaminophen (TYLENOL) tablet 650 mg  650 mg Oral Q6H PRN Laverle Hobby, PA-C      . alum & mag hydroxide-simeth (MAALOX/MYLANTA) 200-200-20 MG/5ML suspension 30 mL  30 mL Oral Q4H PRN Laverle Hobby, PA-C      . benztropine (COGENTIN) tablet 0.5 mg  0.5 mg Oral QHS Taylan Marez, MD      . busPIRone (BUSPAR) tablet 15 mg  15 mg Oral TID Ursula Alert, MD   15 mg at 07/18/15 1201  . fluticasone (FLONASE) 50 MCG/ACT nasal spray 2 spray  2 spray Each Nare Daily Laverle Hobby, PA-C   2 spray at 07/18/15 1201  . fluvoxaMINE (LUVOX) tablet 50 mg  50  mg Oral BH-qamhs Jahmiyah Dullea, MD      . glipiZIDE (GLUCOTROL XL) 24 hr tablet 5 mg  5 mg Oral Q breakfast Laverle Hobby, PA-C   5 mg at 07/18/15 0845  . haloperidol (HALDOL) tablet 5 mg  5 mg Oral QHS Yavier Snider, MD      . insulin aspart (novoLOG)  injection 0-15 Units  0-15 Units Subcutaneous TID WC Ursula Alert, MD   8 Units at 07/18/15 1203  . insulin aspart (novoLOG) injection 0-5 Units  0-5 Units Subcutaneous QHS Karely Hurtado, MD      . loratadine (CLARITIN) tablet 10 mg  10 mg Oral Daily Laverle Hobby, PA-C   10 mg at 07/18/15 0847  . risperiDONE (RISPERDAL M-TABS) disintegrating tablet 2 mg  2 mg Oral Q8H PRN Laverle Hobby, PA-C       And  . LORazepam (ATIVAN) tablet 1 mg  1 mg Oral PRN Laverle Hobby, PA-C       And  . ziprasidone (GEODON) injection 20 mg  20 mg Intramuscular PRN Laverle Hobby, PA-C      . magnesium hydroxide (MILK OF MAGNESIA) suspension 30 mL  30 mL Oral Daily PRN Laverle Hobby, PA-C      . metFORMIN (GLUCOPHAGE) tablet 500 mg  500 mg Oral BID Laverle Hobby, PA-C   500 mg at 07/18/15 0846  . traZODone (DESYREL) tablet 50 mg  50 mg Oral QHS,MR X 1 Spencer E Simon, PA-C   50 mg at 07/18/15 [id number]   PTA Medications: Prescriptions prior to admission  Medication Sig Dispense Refill Last Dose  . busPIRone (BUSPAR) 15 MG tablet Take 1 tablet (15 mg total) by mouth 2 (two) times daily. For anxiety 60 tablet 0 07/18/2015 at Unknown time  . cetirizine (ZYRTEC) 10 MG tablet Take 1 tablet (10 mg total) by mouth daily. For allergies (Patient taking differently: Take 10 mg by mouth daily as needed for allergies. For allergies)  1 Past Week at Unknown time  . DULoxetine (CYMBALTA) 60 MG capsule Take 1 capsule (60 mg total) by mouth 2 (two) times daily. For depression (Patient taking differently: Take 60 mg by mouth daily. For depression) 60 capsule 0 07/18/2015 at Unknown time  . fluticasone (FLONASE) 50 MCG/ACT nasal spray Place 1 spray into both nostrils daily as needed. Allergies  0 unknown  . fluvoxaMINE (LUVOX) 50 MG tablet Take 50 mg by mouth every morning.   07/17/2015 at Unknown time  . glipiZIDE (GLUCOTROL XL) 5 MG 24 hr tablet Take 5 mg by mouth daily with breakfast.   07/17/2015 at Unknown time  . ibuprofen  (ADVIL,MOTRIN) 800 MG tablet Take 1 tablet (800 mg total) by mouth 3 (three) times daily. (Patient taking differently: Take 800 mg by mouth 3 (three) times daily as needed (for pain.). ) 21 tablet 0 unknown  . metFORMIN (GLUCOPHAGE) 500 MG tablet Take 1 tablet (500 mg total) by mouth 2 (two) times daily. For diabetes management 60 tablet 1 07/18/2015 at Unknown time  . doxycycline (VIBRAMYCIN) 100 MG capsule Take 1 capsule (100 mg total) by mouth 2 (two) times daily. (Patient not taking: Reported on 07/18/2015) 14 capsule 0 Not Taking at Unknown time  . metroNIDAZOLE (FLAGYL) 500 MG tablet Take 1 tablet (500 mg total) by mouth 2 (two) times daily. (Patient not taking: Reported on 07/18/2015) 14 tablet 0 Not Taking at Unknown time  . nicotine (NICODERM CQ - DOSED IN MG/24 HOURS) 21 mg/24hr  patch Place 1 patch (21 mg total) onto the skin daily. For smoking cessation (Patient not taking: Reported on 07/07/2015) 28 patch 0 Unknown at Unknown time  . oxyCODONE-acetaminophen (PERCOCET/ROXICET) 5-325 MG tablet Take 1 tablet by mouth every 4 (four) hours as needed for severe pain. (Patient not taking: Reported on 07/17/2015) 6 tablet 0 Not Taking at Unknown time  . traZODone (DESYREL) 50 MG tablet Take 1 tablet (50 mg total) by mouth at bedtime as needed for sleep. (Patient not taking: Reported on 07/07/2015) 30 tablet 0 Not Taking at Unknown time    Musculoskeletal: Strength & Muscle Tone: within normal limits Gait & Station: normal Patient leans: N/A  Psychiatric Specialty Exam: Physical Exam  Nursing note and vitals reviewed. Constitutional:  I concur with PE done in ED.    Review of Systems  Constitutional: Negative.   HENT: Negative.   Eyes: Negative.   Respiratory: Negative.   Cardiovascular: Negative.   Gastrointestinal: Negative.   Genitourinary: Negative.   Musculoskeletal: Negative.   Skin: Negative.   Neurological: Negative for seizures.  Endo/Heme/Allergies: Negative.    Psychiatric/Behavioral: Positive for depression. The patient is nervous/anxious and has insomnia.   All other systems reviewed and are negative.   Blood pressure 141/53, pulse 103, temperature 98.4 F (36.9 C), temperature source Oral, resp. rate 20, height 5\' 6"  (1.676 m), weight 124.739 kg (275 lb).Body mass index is 44.41 kg/(m^2).  General Appearance: Fairly Groomed  Engineer, water::  Good  Speech:  Normal Rate-   Volume:  Normal  Mood:  Anxious and Depressed  Affect:  Congruent  Thought Process:  Linear  Orientation:  Full (Time, Place, and Person)  Thought Content:  Obsessions, Paranoid Ideation and Rumination  Suicidal Thoughts:  Yes.  without intent/plan- denies any current plan or intention of hurting self and contracts for safety on unit   Homicidal Thoughts:  No  Memory:  recent and remote grossly intact , Immediate - fair  Judgement:  Fair  Insight:  Fair  Psychomotor Activity:  Normal  Concentration:  Good  Recall:  Good  Fund of Knowledge:Good  Language: Good  Akathisia:  Negative  Handed:  Right  AIMS (if indicated):     Assets:  Communication Skills Desire for Improvement Resilience  ADL's:  Intact  Cognition: WNL  Sleep:  Number of Hours: 3.75     Treatment Plan Summary:Lexxie A Dwight is a 25 y.o. [name], who has a hx of MDD, impulse control disorder as well as ?mild /moderate ID and learning disability , lives with her mother in [place] , who presented to [facility] from [facility] with IVC papers. Pt today seems to be anxious , depressed, will need inpatient stay. Daily contact with patient to assess and evaluate symptoms and progress in treatment, Medication management, Plan inpatient admission and medications as below   Observation Level/Precautions:  15 minute checks  Laboratory: uds- negative,BAL<5, CBC - wnl, cmp - wnl, PL pending, hba1c- pending , TSH - wnl, pregnancy test - negative , will get EKG for qtc.  Psychotherapy:  Milieu, support   Medications:  Will  increase Luvox to 50 mg po bid for affective sx.  Will DC Cymbalta for lack of efficacy. Will increase Buspar to 15 mg po tid for anxiety sx. Will continue Trazodone 50 mg po qhs for sleep. Will add Haldol 5 mg po qhs for ?paranoia . Will restart home medications where indicated . CBGs for DM.  Consultations: as needed    Discharge Concerns:  -  Safety and stability.  Estimated LOS: 6 days   Other:     I certify that inpatient services furnished can reasonably be expected to improve the patient's condition.   Clay Menser 3/23/20171:22 PM

## 2015-07-18 NOTE — Tx Team (Signed)
Interdisciplinary Treatment Plan Update (Adult)  Date:  07/18/2015   Time Reviewed:  8:15 AM   Progress in Treatment: Attending groups: No Participating in groups:  No Taking medication as prescribed:  Yes. Tolerating medication:  Yes. Family/Significant other contact made:  No Patient understands diagnosis:  Yes  As evidenced by seeking help with "depression, panic attacks and paranoia" Discussing patient identified problems/goals with staff:  Yes, see initial care plan. Medical problems stabilized or resolved:  Yes. Denies suicidal/homicidal ideation: Yes. Issues/concerns per patient self-inventory:  No. Other:  New problem(s) identified:  Discharge Plan or Barriers: return home, follow up outpt  Reason for Continuation of Hospitalization: Anxiety Depression Medication stabilization Other; describe Paranoia  Comments:  25 year old female sent from [facility] with hyperglycemia. Patient states that she was at [facility] because she was depressed and initially suicidal but this has resolved. She states she missed 1 day of her psych meds now that she's back on she feels much better. She is barely involuntarily committed and the paperwork states that she has been experiencing "periods of agitation while the crisis center, with loud are getting on the phone. She reports she has had feelings of suicide with plans to overdose. She reports paranoid thoughts about her boyfriend and a bus driver, whom she believes is sending people to fight her." Patient states she feels much better. Her glucose was apparently at 350 yesterday. Thus today they sent her here. She states she is a symptomatic from this currently. Recently restarted on her diabetic meds.Will increase Luvox to 50 mg po bid for affective sx.  Will DC Cymbalta for lack of efficacy. Will increase Buspar to 15 mg po tid for anxiety sx. Will continue Trazodone 50 mg po qhs for sleep. Will add Haldol 5 mg po qhs for paranoia . Will restart  home medications where indicated .   Estimated length of stay: 4-5 days  New goal(s):  Review of initial/current patient goals per problem list:   Review of initial/current patient goals per problem list:  1. Goal(s): Patient will participate in aftercare plan   Met: Yes   Target date: 3-5 days post admission date   As evidenced by: Patient will participate within aftercare plan AEB aftercare provider and housing plan at discharge being identified. 07/18/15:  Return home, follow up outpt   2. Goal (s): Patient will exhibit decreased depressive symptoms and suicidal ideations.   Met: No   Target date: 3-5 days post admission date   As evidenced by: Patient will utilize self rating of depression at 3 or below and demonstrate decreased signs of depression or be deemed stable for discharge by MD. 07/18/15:  Rates her depression an 8     3. Goal(s): Patient will demonstrate decreased signs and symptoms of anxiety.   Met: No   Target date: 3-5 days post admission date   As evidenced by: Patient will utilize self rating of anxiety at 3 or below and demonstrated decreased signs of anxiety, or be deemed stable for discharge by MD 07/18/15:  Rates her anxiety a 7         5. Goal(s): Patient will demonstrate decreased signs of psychosis  * Met: No  * Target date: 3-5 days post admission date  * As evidenced by: Patient will demonstrate decreased frequency of AVH or return to baseline function 07/18/15:  Pt endorses paranoia        Attendees: Patient:  07/18/2015 8:15 AM   Family:   07/18/2015 8:15 AM  Physician:  Ursula Alert, MD 07/18/2015 8:15 AM   Nursing:   Phillis Haggis RN 07/18/2015 8:15 AM   CSW:    Roque Lias, LCSW   07/18/2015 8:15 AM   Other:  07/18/2015 8:15 AM   Other:   07/18/2015 8:15 AM   Other:  Lars Pinks, Nurse CM 07/18/2015 8:15 AM   Other:   07/18/2015 8:15 AM   Other:  Norberto Sorenson, [place]  07/18/2015 8:15 AM   Other:  07/18/2015 8:15  AM   Other:  07/18/2015 8:15 AM   Other:  07/18/2015 8:15 AM   Other:  07/18/2015 8:15 AM   Other:  07/18/2015 8:15 AM   Other:   07/18/2015 8:15 AM    Scribe for Treatment Team:   Trish Mage, 07/18/2015 8:15 AM

## 2015-07-18 NOTE — Plan of Care (Signed)
Problem: Diagnosis: Increased Risk For Suicide Attempt Goal: STG-Patient Will Comply With Medication Regime Outcome: Progressing Patient is compliant with medication regime.     

## 2015-07-18 NOTE — ED Notes (Signed)
Pt left for [facility] accompanied by [employer]. Pt had no concerns. Was alert and ambulatory with no pain.

## 2015-07-18 NOTE — B[facility] Group Notes (Signed)
[place] Group Notes:  (Nursing/MHT/Case Management/Adjunct)  Date:  07/18/2015  Time:  3:05 PM  Type of Therapy:  Nurse Education  Participation Level:  Active  Participation Quality:  Appropriate and Attentive  Affect:  Appropriate  Cognitive:  Alert and Appropriate  Insight:  Appropriate and Good  Engagement in Group:  Engaged and Improving  Modes of Intervention:  Discussion and Education  Summary of Progress/Problems: Topic was on leisure and lifestyle changes. Discussed the importance of choosing a healthy leisure activities. Group encouraged to surround themselves with positive and healthy group/support system when changing to a healthy lifestyle. Patient was receptive and contributed.  [name] 07/18/2015, 3:05 PM

## 2015-07-18 NOTE — BHH Group Notes (Signed)
[place] Group Notes:  (Counselor/Nursing/MHT/Case Management/Adjunct)  07/18/2015 1:15PM  Type of Therapy:  Group Therapy  Participation Level:  Active  Participation Quality:  Appropriate  Affect:  Flat  Cognitive:  Oriented  Insight:  Improving  Engagement in Group:  Limited  Engagement in Therapy:  Limited  Modes of Intervention:  Discussion, Exploration and Socialization  Summary of Progress/Problems: The topic for group was balance in life.  Pt participated in the discussion about when their life was in balance and out of balance and how this feels.  Pt discussed ways to get back in balance and short term goals they can work on to get where they want to be.  Invited.  Chose to not attend.   Roque Lias B 07/18/2015 1:14 PM

## 2015-07-18 NOTE — Progress Notes (Signed)
DAR NOTE: Patient mood and affect remained depressed.  Denies pain, auditory and visual hallucinations.  Rates depression at 7, hopelessness at 3, and anxiety at 10.  Maintained on routine safety checks.  Medications given as prescribed.  Support and encouragement offered as needed.  Attended group and participated.  States goal for today is "to take different medications."  Minimal interaction with staff and peers.  No signs of hypoglycemic reaction noted.

## 2015-07-18 NOTE — Tx Team (Signed)
Initial Interdisciplinary Treatment Plan   PATIENT STRESSORS: Health problems Medication change or noncompliance Stress with BF   PATIENT STRENGTHS: Active sense of humor Average or above average intelligence Communication skills General fund of knowledge Supportive family/friends   PROBLEM LIST: Problem List/Patient Goals Date to be addressed Date deferred Reason deferred Estimated date of resolution  "having suicidal thoughts" 07-18-15     "problems with my friend" 07-18-15     "I be having a lot of negative thinking" 07-18-15     Depression 07-18-15     "give me some medicine that will help me lose weight" 07-18-15     "get some new medication that's really gonna work, stop me from being negative" "they keep giving me the same kind of medicine that don't work" 07-18-15                        DISCHARGE CRITERIA:  Ability to meet basic life and health needs Improved stabilization in mood, thinking, and/or behavior Need for constant or close observation no longer present Reduction of life-threatening or endangering symptoms to within safe limits Verbal commitment to aftercare and medication compliance  PRELIMINARY DISCHARGE PLAN: Outpatient therapy Participate in family therapy Return to previous living arrangement  PATIENT/FAMIILY INVOLVEMENT: This treatment plan has been presented to and reviewed with the patient, [name], and/or family member.  The patient and family have been given the opportunity to ask questions and make suggestions.  Zoe Lan 07/18/2015, 6:50 AM
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
## 2015-07-18 NOTE — BHH Suicide Risk Assessment (Signed)
[facility] Admission Suicide Risk Assessment   Nursing information obtained from:  Patient Demographic factors:  Low socioeconomic status Current Mental Status:  Suicidal ideation indicated by patient Loss Factors:  Loss of significant relationship, Financial problems / change in socioeconomic status Historical Factors:  Prior suicide attempts Risk Reduction Factors:  Sense of responsibility to family, Living with another person, especially a relative  Total Time spent with patient: 30 minutes Principal Problem: Severe episode of recurrent major depressive disorder, without psychotic features ([place]) Diagnosis:   Patient Active Problem List   Diagnosis Date Noted  . 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 Learning disability [F81.9] 07/18/2015  . 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 Severe episode of recurrent major depressive disorder, without psychotic features ([place]) [F33.2]   . Drug overdose [T50.901A]   . Cognitive deficits [R41.89] 10/12/2012  . Generalized anxiety disorder [F41.1] 06/28/2012   Subjective Data: Please see H&P.   Continued Clinical Symptoms:  Alcohol Use Disorder Identification Test Final Score (AUDIT): 0 The "Alcohol Use Disorders Identification Test", Guidelines for Use in Primary Care, Second Edition.  World Pharmacologist [facility]). Score between 0-7:  no or low risk or alcohol related problems. Score between 8-15:  moderate risk of alcohol related problems. Score between 16-19:  high risk of alcohol related problems. Score 20 or above:  warrants further diagnostic evaluation for alcohol dependence and treatment.   CLINICAL FACTORS:   Severe Anxiety and/or Agitation Unstable or Poor Therapeutic Relationship Previous Psychiatric Diagnoses and Treatments   Musculoskeletal: Strength & Muscle Tone: within normal limits Gait & Station: normal Patient leans: N/A  Psychiatric Specialty Exam: Review of Systems   Psychiatric/Behavioral: Positive for depression. The patient is nervous/anxious.   All other systems reviewed and are negative.   Blood pressure 141/53, pulse 103, temperature 98.4 F (36.9 C), temperature source Oral, resp. rate 20, height 5\' 6"  (1.676 m), weight 124.739 kg (275 lb).Body mass index is 44.41 kg/(m^2).                      Please see H&P.                                   COGNITIVE FEATURES THAT CONTRIBUTE TO RISK:  Closed-mindedness, Polarized thinking and Thought constriction (tunnel vision)    SUICIDE RISK:   Moderate:  Frequent suicidal ideation with limited intensity, and duration, some specificity in terms of plans, no associated intent, good self-control, limited dysphoria/symptomatology, some risk factors present, and identifiable protective factors, including available and accessible social support.  PLAN OF CARE: Please see H&P.   I certify that inpatient services furnished can reasonably be expected to improve the patient's condition.   Milus Fritze, MD 07/18/2015, 1:02 PM

## 2015-07-18 NOTE — Progress Notes (Signed)
NUTRITION NOTE  Consult received for hyperlipidemia education. Per protocol, RN to provide pt with packet outlining general healthy eating as well as hyperlipidemia-specific nutrition information.  No further nutrition intervention warranted at this time. If additional nutrition-related needs emerge please re-consult.   Jarome Matin, RD, LDN Inpatient Clinical Dietitian Pager # [phone] After hours/weekend pager # [phone]

## 2015-07-18 NOTE — Progress Notes (Signed)
Patient ID: [name], female   DOB: 02-Nov-1990, 25 y.o.   MRN: [id number] Client is a 25 yo female admitted with suicidal thoughts, denies plan at this time and contracts for safety. Client's last admission was in 1/17. Client reports current stressor "having problems with my BF" "He goes away and stay for days and when he comes back he don't have no money, I think he giving it to his ex-wife and kids, but his kids grown" "I believe he cheating"  "but I know he do drugs"  "I want to lose weight, that's another depression" "another depression they keep giving me the same medication, don't work" "I need some medication that work, stop these negative thoughts" "I just miss one day of medicine" Client has a medical hx. Of diabetes and complains of "sinus problems" Client also has an elevated BP and pulse on this admission, although she denies hypertension. Client denies alcohol, drug use. Client is suspicious, but cooperative. Client is given food and drink. Oriented to unit/room. Staff will monitor q39min for safety.

## 2015-07-19 LAB — GLUCOSE, CAPILLARY
GLUCOSE-CAPILLARY: 181 mg/dL — AB (ref 65–99)
Glucose-Capillary: 328 mg/dL — ABNORMAL HIGH (ref 65–99)
Glucose-Capillary: 288 mg/dL — ABNORMAL HIGH (ref 65–99)

## 2015-07-19 LAB — PROLACTIN: Prolactin: 37.3 ng/mL — ABNORMAL HIGH (ref 4.8–23.3)

## 2015-07-19 LAB — HEMOGLOBIN A1C
HEMOGLOBIN A1C: 7.8 % — AB (ref 4.8–5.6)
Mean Plasma Glucose: 177 mg/dL

## 2015-07-19 NOTE — Plan of Care (Signed)
Problem: BHH Concurrent Medical Problem Goal: STG-Compliance with medication and/or treatment as ordered (STG-Compliance with medication and/or treatment as ordered by MD)  Outcome: Progressing Pt compliant with medication regime

## 2015-07-19 NOTE — Progress Notes (Signed)
[facility] MD Progress Note  07/19/2015 2:17 PM [name]  MRN:  [id number] Subjective:  Patient was in her room.  Smiling.  Not talkative.  Per nursing, patient would isolate by herself.  She stated that she felt that meds were not working and is shy she is here. Objective:  [name] is a 25 y.o. [name], who has a hx of MDD, impulse control disorder as well as mild /moderate ID and learning disability, lives with her mother in [place], who presented to [facility] from [place] with IVC papers.  Principal Problem: MDD (major depressive disorder), recurrent, severe, with psychosis ([place]) Diagnosis:   Patient Active Problem List   Diagnosis Date Noted  . 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 Learning disability [F81.9] 07/18/2015  . 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 Diabetes mellitus ([place]) [E11.9] 07/18/2015  . MDD (major depressive disorder), recurrent, severe, with psychosis ([place]) [F33.3] 07/18/2015  . Hyperlipidemia [E78.5] 07/18/2015  . Drug overdose [T50.901A]   . Cognitive deficits [R41.89] 10/12/2012  . Generalized anxiety disorder [F41.1] 06/28/2012   Total Time spent with patient: 30 minutes  Past Psychiatric History: see above noted  Past Medical History:  Past Medical History  Diagnosis Date  . Anxiety   . Mental disorder   . Depression   . Cognitive deficits   . Bipolar 1 disorder ([place])   . Diabetes mellitus without complication ([place])   . Obesity     Past Surgical History  Procedure Laterality Date  . Cesarean section    . Tonsillectomy N/A 06/03/2012    Procedure: TONSILLECTOMY;  Surgeon: [name], MD;  Location: [facility];  Service: ENT;  Laterality: N/A;  . Mass excision N/A 06/03/2012    Procedure: EXCISION MASS;  Surgeon: [name], MD;  Location: [facility];  Service: ENT;  Laterality: N/A;  Excision uvula mass  . Cesarean section N/A 04/25/2013    Procedure: REPEAT CESAREAN SECTION;   Surgeon: [name], MD;  Location: [facility];  Service: Obstetrics;  Laterality: N/A;  . Tonsillectomy     Family History:  Family History  Problem Relation Age of Onset  . Hypertension Mother   . Diabetes Father    Family Psychiatric  History: see above noted Social History:  History  Alcohol Use No     History  Drug Use No    Comment: Patient denies    Social History   Social History  . Marital Status: Single    Spouse Name: N/A  . Number of Children: N/A  . Years of Education: N/A   Social History Main Topics  . Smoking status: Current Every Day Smoker -- 0.50 packs/day for 7 years    Types: Cigarettes  . Smokeless tobacco: Never Used  . Alcohol Use: No  . Drug Use: No     Comment: Patient denies  . Sexual Activity: No   Other Topics Concern  . None   Social History Narrative   Additional Social History:     Sleep: Good  Appetite:  Good  Current Medications: Current Facility-Administered Medications  Medication Dose Route Frequency Provider Last Rate Last Dose  . acetaminophen (TYLENOL) tablet 650 mg  650 mg Oral Q6H PRN Laverle Hobby, PA-C      . alum & mag hydroxide-simeth (MAALOX/MYLANTA) 200-200-20 MG/5ML suspension 30 mL  30 mL Oral Q4H PRN Laverle Hobby, PA-C      . benztropine (COGENTIN) tablet 0.5 mg  0.5 mg Oral  QHS Ursula Alert, MD   0.5 mg at 07/18/15 2137  . busPIRone (BUSPAR) tablet 15 mg  15 mg Oral TID Ursula Alert, MD   15 mg at 07/19/15 1153  . fluticasone (FLONASE) 50 MCG/ACT nasal spray 2 spray  2 spray Each Nare Daily Laverle Hobby, PA-C   2 spray at 07/19/15 1032  . fluvoxaMINE (LUVOX) tablet 50 mg  50 mg Oral BH-qamhs Saramma Eappen, MD   50 mg at 07/19/15 1031  . glipiZIDE (GLUCOTROL XL) 24 hr tablet 5 mg  5 mg Oral Q breakfast Laverle Hobby, PA-C   5 mg at 07/19/15 1034  . haloperidol (HALDOL) tablet 5 mg  5 mg Oral QHS Ursula Alert, MD   5 mg at 07/18/15 2137  . insulin aspart (novoLOG) injection 0-15 Units  0-15  Units Subcutaneous TID WC Ursula Alert, MD   11 Units at 07/19/15 1154  . insulin aspart (novoLOG) injection 0-5 Units  0-5 Units Subcutaneous QHS Ursula Alert, MD   3 Units at 07/18/15 2140  . loratadine (CLARITIN) tablet 10 mg  10 mg Oral Daily Laverle Hobby, PA-C   10 mg at 07/19/15 1031  . magnesium hydroxide (MILK OF MAGNESIA) suspension 30 mL  30 mL Oral Daily PRN Laverle Hobby, PA-C      . metFORMIN (GLUCOPHAGE) tablet 500 mg  500 mg Oral BID Laverle Hobby, PA-C   500 mg at 07/19/15 1031  . risperiDONE (RISPERDAL M-TABS) disintegrating tablet 2 mg  2 mg Oral Q8H PRN Laverle Hobby, PA-C   2 mg at 07/19/15 1326   And  . ziprasidone (GEODON) injection 20 mg  20 mg Intramuscular PRN Laverle Hobby, PA-C      . simvastatin (ZOCOR) tablet 20 mg  20 mg Oral q1800 Ursula Alert, MD   20 mg at 07/18/15 1711  . traZODone (DESYREL) tablet 50 mg  50 mg Oral QHS,MR X 1 Laverle Hobby, PA-C   50 mg at 07/18/15 2242    Lab Results:  Results for orders placed or performed during the hospital encounter of 07/18/15 (from the past 48 hour(s))  Glucose, capillary     Status: Abnormal   Collection Time: 07/18/15  6:17 AM  Result Value Ref Range   Glucose-Capillary 188 (H) 65 - 99 mg/dL   Comment 1 Notify RN   Hemoglobin A1c     Status: Abnormal   Collection Time: 07/18/15  6:44 AM  Result Value Ref Range   Hgb A1c MFr Bld 7.8 (H) 4.8 - 5.6 %    Comment: (NOTE)         Pre-diabetes: 5.7 - 6.4         Diabetes: >6.4         Glycemic control for adults with diabetes: <7.0    Mean Plasma Glucose 177 mg/dL    Comment: (NOTE) Performed At: [facility] [place], [place] [id number] Lindon Romp MD [id number] Performed at [facility]   Lipid panel, fasting     Status: Abnormal   Collection Time: 07/18/15  6:44 AM  Result Value Ref Range   Cholesterol 212 (H) 0 - 200 mg/dL   Triglycerides 496 (H) <150 mg/dL   HDL 40 (L) >40 mg/dL    Total CHOL/HDL Ratio 5.3 RATIO   VLDL UNABLE TO CALCULATE IF TRIGLYCERIDE OVER 400 mg/dL 0 - 40 mg/dL   LDL Cholesterol UNABLE TO CALCULATE IF TRIGLYCERIDE OVER 400 mg/dL 0 -  99 mg/dL    Comment:        Total Cholesterol/HDL:CHD Risk Coronary Heart Disease Risk Table                     Men   Women  1/2 Average Risk   3.4   3.3  Average Risk       5.0   4.4  2 X Average Risk   9.6   7.1  3 X Average Risk  23.4   11.0        Use the calculated Patient Ratio above and the CHD Risk Table to determine the patient's CHD Risk.        ATP III CLASSIFICATION (LDL):  <100     mg/dL   Optimal  100-129  mg/dL   Near or Above                    Optimal  130-159  mg/dL   Borderline  160-189  mg/dL   High  >190     mg/dL   Very High Performed at [facility]   TSH     Status: None   Collection Time: 07/18/15  6:44 AM  Result Value Ref Range   TSH 3.197 0.350 - 4.500 uIU/mL    Comment: Performed at [facility]  Prolactin     Status: Abnormal   Collection Time: 07/18/15  6:44 AM  Result Value Ref Range   Prolactin 37.3 (H) 4.8 - 23.3 ng/mL    Comment: (NOTE) Performed At: [facility] [address] [place], [place] [id number] Lindon Romp MD [id number] Performed at [facility]   Glucose, capillary     Status: Abnormal   Collection Time: 07/18/15 11:29 AM  Result Value Ref Range   Glucose-Capillary 293 (H) 65 - 99 mg/dL  Glucose, capillary     Status: Abnormal   Collection Time: 07/18/15  4:52 PM  Result Value Ref Range   Glucose-Capillary 245 (H) 65 - 99 mg/dL  Glucose, capillary     Status: Abnormal   Collection Time: 07/18/15  9:08 PM  Result Value Ref Range   Glucose-Capillary 260 (H) 65 - 99 mg/dL  Glucose, capillary     Status: Abnormal   Collection Time: 07/19/15  6:10 AM  Result Value Ref Range   Glucose-Capillary 181 (H) 65 - 99 mg/dL  Glucose, capillary     Status: Abnormal   Collection Time: 07/19/15 11:51  AM  Result Value Ref Range   Glucose-Capillary 328 (H) 65 - 99 mg/dL   Comment 1 Notify RN    Comment 2 Document in Chart     Blood Alcohol level:  Lab Results  Component Value Date   ETH <5 07/17/2015   ETH <5 05/29/2015    Physical Findings: AIMS: Facial and Oral Movements Muscles of Facial Expression: None, normal Lips and Perioral Area: None, normal Jaw: None, normal Tongue: None, normal,Extremity Movements Upper (arms, wrists, hands, fingers): None, normal Lower (legs, knees, ankles, toes): None, normal, Trunk Movements Neck, shoulders, hips: None, normal, Overall Severity Severity of abnormal movements (highest score from questions above): None, normal Incapacitation due to abnormal movements: None, normal Patient's awareness of abnormal movements (rate only patient's report): No Awareness, Dental Status Current problems with teeth and/or dentures?: No Does patient usually wear dentures?: No  CIWA:  CIWA-Ar Total: 0 COWS:     Musculoskeletal: Strength & Muscle Tone: within normal limits Gait &  Station: normal Patient leans: N/A  Psychiatric Specialty Exam: Review of Systems  Psychiatric/Behavioral: Positive for depression. Negative for suicidal ideas and hallucinations. The patient is nervous/anxious.   All other systems reviewed and are negative.   Blood pressure 122/68, pulse 121, temperature 97.7 F (36.5 C), temperature source Oral, resp. rate 20, height 5\' 6"  (1.676 m), weight 124.739 kg (275 lb).Body mass index is 44.41 kg/(m^2).   General Appearance: Fairly Groomed  Engineer, water:: Good  Speech: Normal Rate-   Volume: Normal  Mood: Anxious and Depressed  Affect: Congruent  Thought Process: Linear  Orientation: Full (Time, Place, and Person)  Thought Content: Obsessions, Paranoid Ideation and Rumination  Suicidal Thoughts: Yes. without intent/plan- denies any current plan or intention of hurting self and contracts for safety on unit    Homicidal Thoughts: No  Memory: recent and remote grossly intact , Immediate - fair  Judgement: Fair  Insight: Fair  Psychomotor Activity: Normal  Concentration: Good  Recall: Good  Fund of Knowledge:Good  Language: Good  Akathisia: Negative  Handed: Right  AIMS (if indicated):    Assets: Communication Skills Desire for Improvement Resilience  ADL's: Intact  Cognition: WNL  Sleep: Number of Hours: 6       Treatment Plan Summary: - Admit for crisis management and mood stabilization.  - Medication management to re-stabilize current mood symptoms.   Medications: Will increase Luvox to 50 mg po bid for affective sx.  Will DC Cymbalta for lack of efficacy. Will increase Buspar to 15 mg po tid for anxiety sx. Will continue Trazodone 50 mg po qhs for sleep. Will add Haldol 5 mg po qhs for ?paranoia . Will restart home medications where indicated . CBGs for DM.  - Group counseling sessions for coping skills - Medical consults as needed - Review and reinstate any pertinent home medications for other health problems  Janett Labella, NP [facility] 07/19/2015, 2:17 PM   Agree with NP Progress Note as above  Neita Garnet, MD

## 2015-07-19 NOTE — BHH Group Notes (Signed)
[facility] LCSW Aftercare Discharge Planning Group Note   07/19/2015 11:44 AM  Participation Quality: Invited. Chose not to attend.    [name]
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
## 2015-07-19 NOTE — Progress Notes (Signed)
Adult Psychoeducational Group Note  Date:  07/19/2015 Time:  10:04 PM  Group Topic/Focus:  Wrap-Up Group:   The focus of this group is to help patients review their daily goal of treatment and discuss progress on daily workbooks.  Participation Level:  Minimal  Participation Quality:  Sharing  Affect:  Blunted  Cognitive:  Lacking  Insight: Limited  Engagement in Group:  Limited  Modes of Intervention:  Socialization and Support  Additional Comments:  Patient attended and participated in group tonight. She reports having a great day. The food was good. She went for group and spoke with her doctor day.  Salley Scarlet [facility] 07/19/2015, 10:04 PM

## 2015-07-19 NOTE — Progress Notes (Signed)
DAR NOTE: Patient presents with anxious affect and depressed mood.  Denies pain, auditory and visual hallucinations.  Rates depression at 3, hopelessness at 0, and anxiety at 5.  Maintained on routine safety checks.  Medications given as prescribed.  Support and encouragement offered as needed.  Attended group and participated.  States goal for today is "try to go to groups and take my meds."  Patient was very agitated after a phone conversation.  Patient was very disruptive on the unit.  Ativan and Risperdal was given for severe agitation with good effect.

## 2015-07-19 NOTE — Progress Notes (Signed)
Patient ID: [name], female   DOB: Sep 11, 1990, 25 y.o.   MRN: [id number] D: Patient reports tolerating medication well. Reports Abilify is helping with her mood. Denies SI/HI/AVH and pain.No behavioral issues noted.  A: Support and encouragement offered as needed. Medications administered as prescribed.  R: Patient cooperative and appropriate on unit. Will continue to monitor patient for safety and stability.

## 2015-07-19 NOTE — Progress Notes (Signed)
Recreation Therapy Notes  03.24.2017 approximately 3:15pm. Per MD order LRT met with patient to investigate ways to enhance tx during admission. Patient reports she is currently experiencing difficulties in her relationship because she believes he is giving money to his ex-wife and their daughter. Patient reports she does not want either of them to have any of her boyfriends money because he did not help her financially when he lived with her and that she does not like them. Patient does report her boyfriend states he is using crack and that is where his money is going, patient reports if this is true "I'm fine with that." Patient reports no leisure interest and that she desires an ACT team. Patient reports she has previously had an ACT team, but they abruptly stopped, patient wonders if it is because she "acted out on them." Patient described as cussing and yelling. LRT investigated if patient would be interested in community programs, patient initially expressed interest, however when she was informed she would have to provide her own transportation patient withdrew interest.   Lane Hacker, LRT/CTRS   Lane Hacker 07/19/2015 4:07 PM

## 2015-07-20 LAB — GLUCOSE, CAPILLARY
GLUCOSE-CAPILLARY: 180 mg/dL — AB (ref 65–99)
GLUCOSE-CAPILLARY: 223 mg/dL — AB (ref 65–99)
Glucose-Capillary: 195 mg/dL — ABNORMAL HIGH (ref 65–99)
Glucose-Capillary: 286 mg/dL — ABNORMAL HIGH (ref 65–99)
Glucose-Capillary: 254 mg/dL — ABNORMAL HIGH (ref 65–99)

## 2015-07-20 NOTE — [facility] Group Notes (Signed)
[facility] Group Notes:  (Clinical Social Work)  07/20/2015  11:15-12:00PM  Summary of Progress/Problems:   Today's process group involved patients discussing their feelings related to being hospitalized, as well as how they can use their present feelings to create a plan for how to avoid future hospitalizations. The patient expressed her primary feeling about being hospitalized is "great, because my meds are working."  She stated she is calmer and her mother told her that she sounds better too.  That is all she said in group although she remained in the room.  Type of Therapy:  Group Therapy - Process  Participation Level:  Minimal  Participation Quality:  Attentive  Affect:  Flat  Cognitive:  Appropriate and Disorganized  Insight:  Developing/Improving  Engagement in Therapy:  Engaged  Modes of Intervention:  Exploration, Discussion  Selmer Dominion, LCSW 07/20/2015, 1:27 PM

## 2015-07-20 NOTE — Progress Notes (Signed)
D Pt. Denies SI and HI, no complaints of pain or discomfort noted this pm.  A Writer offered support and encouragement discussed pt.'s day with her.   R Pt. Rates her day a 9, her anxiety and  Depression a 0.  Pt. Denies A and VH.  Pt. Does express concern of the possible side effects of Haldol. Writer encouirages the pt. To be compliant with her medications and give them a chance , reminding her how much better she is feeling since her admission.  Writer held the trazodone d/t pt. Complaining of being sleepy during the day.  Pt. Remains safe on the unit.

## 2015-07-20 NOTE — Progress Notes (Signed)
Patient ID: [name], female   DOB: February 02, 1991, 25 y.o.   MRN: [id number]   D: Pt has been appropriate on the unit today. Pt attended all groups and engaged in treatment. Pt reported that her depression was a high, her hopelessness was a high, and her anxiety was a high. Pt reported being negative SI/HI, no AH/VH noted. Pt reported that her goal for today was to not get upset. No other issues or concerns noted, pt took all medications without any problems. A: 15 min checks continued for patient safety. R: Pt safety maintained.

## 2015-07-20 NOTE — Progress Notes (Signed)
Patient ID: [name], female   DOB: 1990/08/18, 25 y.o.   MRN: [id number] [facility] MD Progress Note  07/20/2015 11:24 AM SAXON HUNKELE  MRN:  [id number] Subjective:  Patient remains in her room and isolates her self. Slept reasonable. Not impulsive.  Objective:  LAURENASHLEY NAKAGAWA is a 25 y.o. [name], who has a hx of MDD, impulse control disorder as well as mild /moderate ID and learning disability, lives with her mother in [place], who presented to [facility] from [place] with IVC papers.  Continues to remain isolative but not irritable.  buspar was added yesterday along with haldol. Will monitor Luvox started as well.  Patient not gone worse. Will encourage to attend groups  Severity of depression: 4/10 Energy level low Mood and self esteem : down  Principal Problem: MDD (major depressive disorder), recurrent, severe, with psychosis ([place]) Diagnosis:   Patient Active Problem List   Diagnosis Date Noted  . 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 Learning disability [F81.9] 07/18/2015  . 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 Diabetes mellitus ([place]) [E11.9] 07/18/2015  . MDD (major depressive disorder), recurrent, severe, with psychosis ([place]) [F33.3] 07/18/2015  . Hyperlipidemia [E78.5] 07/18/2015  . Drug overdose [T50.901A]   . Cognitive deficits [R41.89] 10/12/2012  . Generalized anxiety disorder [F41.1] 06/28/2012   Total Time spent with patient: 30 minutes  Past Psychiatric History: see above noted  Past Medical History:  Past Medical History  Diagnosis Date  . Anxiety   . Mental disorder   . Depression   . Cognitive deficits   . Bipolar 1 disorder ([place])   . Diabetes mellitus without complication ([place])   . Obesity     Past Surgical History  Procedure Laterality Date  . Cesarean section    . Tonsillectomy N/A 06/03/2012    Procedure: TONSILLECTOMY;  Surgeon: [name], MD;  Location: [facility];  Service: ENT;  Laterality: N/A;  .  Mass excision N/A 06/03/2012    Procedure: EXCISION MASS;  Surgeon: [name], MD;  Location: [facility];  Service: ENT;  Laterality: N/A;  Excision uvula mass  . Cesarean section N/A 04/25/2013    Procedure: REPEAT CESAREAN SECTION;  Surgeon: [name], MD;  Location: [facility];  Service: Obstetrics;  Laterality: N/A;  . Tonsillectomy     Family History:  Family History  Problem Relation Age of Onset  . Hypertension Mother   . Diabetes Father    Family Psychiatric  History: see above noted Social History:  History  Alcohol Use No     History  Drug Use No    Comment: Patient denies    Social History   Social History  . Marital Status: Single    Spouse Name: N/A  . Number of Children: N/A  . Years of Education: N/A   Social History Main Topics  . Smoking status: Current Every Day Smoker -- 0.50 packs/day for 7 years    Types: Cigarettes  . Smokeless tobacco: Never Used  . Alcohol Use: No  . Drug Use: No     Comment: Patient denies  . Sexual Activity: No   Other Topics Concern  . None   Social History Narrative     Current Medications: Current Facility-Administered Medications  Medication Dose Route Frequency Provider Last Rate Last Dose  . acetaminophen (TYLENOL) tablet 650 mg  650 mg Oral Q6H PRN Laverle Hobby, PA-C      . alum & mag hydroxide-simeth (MAALOX/MYLANTA) 200-200-20  MG/5ML suspension 30 mL  30 mL Oral Q4H PRN Laverle Hobby, PA-C      . benztropine (COGENTIN) tablet 0.5 mg  0.5 mg Oral QHS Ursula Alert, MD   0.5 mg at 07/19/15 2209  . busPIRone (BUSPAR) tablet 15 mg  15 mg Oral TID Ursula Alert, MD   15 mg at 07/20/15 1015  . fluticasone (FLONASE) 50 MCG/ACT nasal spray 2 spray  2 spray Each Nare Daily Laverle Hobby, PA-C   2 spray at 07/20/15 1016  . fluvoxaMINE (LUVOX) tablet 50 mg  50 mg Oral BH-qamhs Saramma Eappen, MD   50 mg at 07/20/15 1016  . glipiZIDE (GLUCOTROL XL) 24 hr tablet 5 mg  5 mg Oral Q breakfast  Laverle Hobby, PA-C   5 mg at 07/20/15 0800  . haloperidol (HALDOL) tablet 5 mg  5 mg Oral QHS Ursula Alert, MD   5 mg at 07/19/15 2209  . insulin aspart (novoLOG) injection 0-15 Units  0-15 Units Subcutaneous TID WC Ursula Alert, MD   8 Units at 07/20/15 0647  . insulin aspart (novoLOG) injection 0-5 Units  0-5 Units Subcutaneous QHS Ursula Alert, MD   3 Units at 07/18/15 2140  . loratadine (CLARITIN) tablet 10 mg  10 mg Oral Daily Laverle Hobby, PA-C   10 mg at 07/20/15 1015  . magnesium hydroxide (MILK OF MAGNESIA) suspension 30 mL  30 mL Oral Daily PRN Laverle Hobby, PA-C      . metFORMIN (GLUCOPHAGE) tablet 500 mg  500 mg Oral BID Laverle Hobby, PA-C   500 mg at 07/20/15 1016  . risperiDONE (RISPERDAL M-TABS) disintegrating tablet 2 mg  2 mg Oral Q8H PRN Laverle Hobby, PA-C   2 mg at 07/19/15 1326   And  . ziprasidone (GEODON) injection 20 mg  20 mg Intramuscular PRN Laverle Hobby, PA-C      . simvastatin (ZOCOR) tablet 20 mg  20 mg Oral q1800 Ursula Alert, MD   20 mg at 07/19/15 1716  . traZODone (DESYREL) tablet 50 mg  50 mg Oral QHS,MR X 1 Laverle Hobby, PA-C   50 mg at 07/18/15 2242    Lab Results:  Results for orders placed or performed during the hospital encounter of 07/18/15 (from the past 48 hour(s))  Glucose, capillary     Status: Abnormal   Collection Time: 07/18/15 11:29 AM  Result Value Ref Range   Glucose-Capillary 293 (H) 65 - 99 mg/dL  Glucose, capillary     Status: Abnormal   Collection Time: 07/18/15  4:52 PM  Result Value Ref Range   Glucose-Capillary 245 (H) 65 - 99 mg/dL  Glucose, capillary     Status: Abnormal   Collection Time: 07/18/15  9:08 PM  Result Value Ref Range   Glucose-Capillary 260 (H) 65 - 99 mg/dL  Glucose, capillary     Status: Abnormal   Collection Time: 07/19/15  6:10 AM  Result Value Ref Range   Glucose-Capillary 181 (H) 65 - 99 mg/dL  Glucose, capillary     Status: Abnormal   Collection Time: 07/19/15 11:51 AM   Result Value Ref Range   Glucose-Capillary 328 (H) 65 - 99 mg/dL   Comment 1 Notify RN    Comment 2 Document in Chart   Glucose, capillary     Status: Abnormal   Collection Time: 07/19/15  4:42 PM  Result Value Ref Range   Glucose-Capillary 288 (H) 65 - 99 mg/dL  Glucose, capillary  Status: Abnormal   Collection Time: 07/19/15  9:10 PM  Result Value Ref Range   Glucose-Capillary 195 (H) 65 - 99 mg/dL  Glucose, capillary     Status: Abnormal   Collection Time: 07/20/15  6:15 AM  Result Value Ref Range   Glucose-Capillary 286 (H) 65 - 99 mg/dL    Blood Alcohol level:  Lab Results  Component Value Date   ETH <5 07/17/2015   ETH <5 05/29/2015    Physical Findings: AIMS: Facial and Oral Movements Muscles of Facial Expression: None, normal Lips and Perioral Area: None, normal Jaw: None, normal Tongue: None, normal,Extremity Movements Upper (arms, wrists, hands, fingers): None, normal Lower (legs, knees, ankles, toes): None, normal, Trunk Movements Neck, shoulders, hips: None, normal, Overall Severity Severity of abnormal movements (highest score from questions above): None, normal Incapacitation due to abnormal movements: None, normal Patient's awareness of abnormal movements (rate only patient's report): No Awareness, Dental Status Current problems with teeth and/or dentures?: No Does patient usually wear dentures?: No  CIWA:  CIWA-Ar Total: 0 COWS:     Musculoskeletal: Strength & Muscle Tone: within normal limits Gait & Station: normal Patient leans: N/A  Psychiatric Specialty Exam: Review of Systems  Psychiatric/Behavioral: Positive for depression. Negative for suicidal ideas and hallucinations. The patient is nervous/anxious.   All other systems reviewed and are negative.   Blood pressure 134/82, pulse 118, temperature 98 F (36.7 C), temperature source Oral, resp. rate 16, height 5\' 6"  (1.676 m), weight 124.739 kg (275 lb).Body mass index is 44.41 kg/(m^2).    General Appearance: Fairly Groomed  Engineer, water:: Good  Speech: Normal Rate-   Volume: Normal  Mood:  Depressed  Affect: Congruent  Thought Process: Linear  Orientation: Full (Time, Place, and Person)  Thought Content: Obsessions, Paranoid Ideation and Rumination  Suicidal Thoughts:  denies any current plan or intention of hurting self and contracts for safety on unit   Homicidal Thoughts: No  Memory: recent and remote grossly intact , Immediate - fair  Judgement: Fair  Insight: Fair  Psychomotor Activity: Normal  Concentration: Good  Recall: Good  Fund of Knowledge:Good  Language: Good  Akathisia: Negative  Handed: Right  AIMS (if indicated):    Assets: Communication Skills Desire for Improvement Resilience  ADL's: Intact  Cognition: WNL  Sleep: Number of Hours: 6       Treatment Plan Summary: - Admit for crisis management and mood stabilization.  - Medication management to re-stabilize current mood symptoms.   Medications: cotinue Luvox to 50 mg po bid for affective sx.   Buspar to 15 mg po tid for anxiety sx.  Will continue Trazodone 50 mg po qhs for sleep.  Haldol 5 mg po qhs for ?paranoia . Will restart home medications where indicated . CBGs for DM.  - Group counseling sessions for coping skills - Medical consults as needed - Review and reinstate any pertinent home medications for other health problems  Merian Capron, MD  07/20/2015, 11:24 AM

## 2015-07-20 NOTE — Progress Notes (Signed)
Adult Psychoeducational Group Note  Date:  07/20/2015 Time:  10:05 PM  Group Topic/Focus:  Wrap-Up Group:   The focus of this group is to help patients review their daily goal of treatment and discuss progress on daily workbooks.  Participation Level:  Minimal  Participation Quality:  Appropriate  Affect:  Appropriate  Cognitive:  Alert  Insight: Appropriate  Engagement in Group:  Engaged  Modes of Intervention:  Discussion  Additional Comments:  Pt stated that today was a great day. She didn't get into an argument with her mom. Her goal for tomorrow is to get her meds adjusted so that she doesn't feel so sleepy during the day.   Wynelle Fanny R 07/20/2015, 10:05 PM

## 2015-07-21 LAB — GLUCOSE, CAPILLARY
GLUCOSE-CAPILLARY: 201 mg/dL — AB (ref 65–99)
GLUCOSE-CAPILLARY: 209 mg/dL — AB (ref 65–99)
Glucose-Capillary: 185 mg/dL — ABNORMAL HIGH (ref 65–99)
Glucose-Capillary: 222 mg/dL — ABNORMAL HIGH (ref 65–99)

## 2015-07-21 MED ORDER — NICOTINE POLACRILEX 2 MG MT GUM
2.0000 mg | CHEWING_GUM | OROMUCOSAL | Status: DC | PRN
  Administered 2015-07-21: 2 mg via ORAL

## 2015-07-21 NOTE — Progress Notes (Signed)
Adult Psychoeducational Group Note  Date:  07/21/2015 Time:  9:22 PM  Group Topic/Focus:  Wrap-Up Group:   The focus of this group is to help patients review their daily goal of treatment and discuss progress on daily workbooks.  Participation Level:  Active  Participation Quality:  Attentive  Affect:  Appropriate  Cognitive:  Appropriate  Insight: Appropriate  Engagement in Group:  Engaged  Modes of Intervention:  Discussion  Additional Comments:  Pt wish to have a better day tomorrow and states he is ready to go home.   Jerline Pain 07/21/2015, 9:22 PM

## 2015-07-21 NOTE — Progress Notes (Signed)
Patient ID: [name], female   DOB: [birth year], 25 y.o.   MRN: [id number] [facility] MD Progress Note  07/21/2015 11:33 AM [name]  MRN:  [id number] Subjective:  Patient remains in her room and isolates her self. Slept reasonable. Not impulsive.  Objective:  [name] is a 25 y.o. [name], who has a hx of MDD, impulse control disorder as well as mild /moderate ID and learning disability, lives with her mother in [place], who presented to [facility] from [place] with IVC papers.   On evaluation today. Patient is less isolative and not gone worse. Luvox was started for depression and impulse control.   buspar helping anxiety. Less paranoid. On haldol now.  Will encourage to attend groups  Severity of depression: 4/10 Energy level still  low Mood and self esteem : down  Principal Problem: MDD (major depressive disorder), recurrent, severe, with psychosis ([place]) Diagnosis:   Patient Active Problem List   Diagnosis Date Noted  . 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 Learning disability [F81.9] 07/18/2015  . 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 Diabetes mellitus ([place]) [E11.9] 07/18/2015  . MDD (major depressive disorder), recurrent, severe, with psychosis ([place]) [F33.3] 07/18/2015  . Hyperlipidemia [E78.5] 07/18/2015  . Drug overdose [T50.901A]   . Cognitive deficits [R41.89] 10/12/2012  . Generalized anxiety disorder [F41.1] 06/28/2012   Total Time spent with patient: 30 minutes  Past Psychiatric History: see above noted  Past Medical History:  Past Medical History  Diagnosis Date  . Anxiety   . Mental disorder   . Depression   . Cognitive deficits   . Bipolar 1 disorder ([place])   . Diabetes mellitus without complication ([place])   . Obesity     Past Surgical History  Procedure Laterality Date  . Cesarean section    . Tonsillectomy N/A 06/03/2012    Procedure: TONSILLECTOMY;  Surgeon: [name], MD;  Location: [facility];   Service: ENT;  Laterality: N/A;  . Mass excision N/A 06/03/2012    Procedure: EXCISION MASS;  Surgeon: [name], MD;  Location: [facility];  Service: ENT;  Laterality: N/A;  Excision uvula mass  . Cesarean section N/A 04/25/2013    Procedure: REPEAT CESAREAN SECTION;  Surgeon: [name], MD;  Location: [facility];  Service: Obstetrics;  Laterality: N/A;  . Tonsillectomy     Family History:  Family History  Problem Relation Age of Onset  . Hypertension Mother   . Diabetes Father    Family Psychiatric  History: see above noted Social History:  History  Alcohol Use No     History  Drug Use No    Comment: Patient denies    Social History   Social History  . Marital Status: Single    Spouse Name: N/A  . Number of Children: N/A  . Years of Education: N/A   Social History Main Topics  . Smoking status: Current Every Day Smoker -- 0.50 packs/day for 7 years    Types: Cigarettes  . Smokeless tobacco: Never Used  . Alcohol Use: No  . Drug Use: No     Comment: Patient denies  . Sexual Activity: No   Other Topics Concern  . None   Social History Narrative     Current Medications: Current Facility-Administered Medications  Medication Dose Route Frequency Provider Last Rate Last Dose  . acetaminophen (TYLENOL) tablet 650 mg  650 mg Oral Q6H PRN Laverle Hobby, PA-C      .  alum & mag hydroxide-simeth (MAALOX/MYLANTA) 200-200-20 MG/5ML suspension 30 mL  30 mL Oral Q4H PRN Laverle Hobby, PA-C      . benztropine (COGENTIN) tablet 0.5 mg  0.5 mg Oral QHS Ursula Alert, MD   0.5 mg at 07/20/15 2118  . busPIRone (BUSPAR) tablet 15 mg  15 mg Oral TID Ursula Alert, MD   15 mg at 07/21/15 0759  . fluticasone (FLONASE) 50 MCG/ACT nasal spray 2 spray  2 spray Each Nare Daily Laverle Hobby, PA-C   2 spray at 07/21/15 0759  . fluvoxaMINE (LUVOX) tablet 50 mg  50 mg Oral BH-qamhs Saramma Eappen, MD   50 mg at 07/21/15 0759  . glipiZIDE (GLUCOTROL XL) 24 hr  tablet 5 mg  5 mg Oral Q breakfast Laverle Hobby, PA-C   5 mg at 07/21/15 0759  . haloperidol (HALDOL) tablet 5 mg  5 mg Oral QHS Ursula Alert, MD   5 mg at 07/20/15 2118  . insulin aspart (novoLOG) injection 0-15 Units  0-15 Units Subcutaneous TID WC Ursula Alert, MD   3 Units at 07/21/15 0630  . insulin aspart (novoLOG) injection 0-5 Units  0-5 Units Subcutaneous QHS Ursula Alert, MD   2 Units at 07/20/15 2119  . loratadine (CLARITIN) tablet 10 mg  10 mg Oral Daily Laverle Hobby, PA-C   10 mg at 07/21/15 0759  . magnesium hydroxide (MILK OF MAGNESIA) suspension 30 mL  30 mL Oral Daily PRN Laverle Hobby, PA-C      . metFORMIN (GLUCOPHAGE) tablet 500 mg  500 mg Oral BID Laverle Hobby, PA-C   500 mg at 07/21/15 0759  . nicotine polacrilex (NICORETTE) gum 2 mg  2 mg Oral PRN Merian Capron, MD   2 mg at 07/21/15 1049  . risperiDONE (RISPERDAL M-TABS) disintegrating tablet 2 mg  2 mg Oral Q8H PRN Laverle Hobby, PA-C   2 mg at 07/20/15 2247   And  . ziprasidone (GEODON) injection 20 mg  20 mg Intramuscular PRN Laverle Hobby, PA-C      . simvastatin (ZOCOR) tablet 20 mg  20 mg Oral q1800 Ursula Alert, MD   20 mg at 07/20/15 1647  . traZODone (DESYREL) tablet 50 mg  50 mg Oral QHS,MR X 1 Laverle Hobby, PA-C   50 mg at 07/18/15 2242    Lab Results:  Results for orders placed or performed during the hospital encounter of 07/18/15 (from the past 48 hour(s))  Glucose, capillary     Status: Abnormal   Collection Time: 07/19/15 11:51 AM  Result Value Ref Range   Glucose-Capillary 328 (H) 65 - 99 mg/dL   Comment 1 Notify RN    Comment 2 Document in Chart   Glucose, capillary     Status: Abnormal   Collection Time: 07/19/15  4:42 PM  Result Value Ref Range   Glucose-Capillary 288 (H) 65 - 99 mg/dL  Glucose, capillary     Status: Abnormal   Collection Time: 07/19/15  9:10 PM  Result Value Ref Range   Glucose-Capillary 195 (H) 65 - 99 mg/dL  Glucose, capillary     Status: Abnormal    Collection Time: 07/20/15  6:15 AM  Result Value Ref Range   Glucose-Capillary 286 (H) 65 - 99 mg/dL  Glucose, capillary     Status: Abnormal   Collection Time: 07/20/15 11:49 AM  Result Value Ref Range   Glucose-Capillary 180 (H) 65 - 99 mg/dL  Glucose, capillary  Status: Abnormal   Collection Time: 07/20/15  4:34 PM  Result Value Ref Range   Glucose-Capillary 254 (H) 65 - 99 mg/dL   Comment 1 Notify RN    Comment 2 Document in Chart   Glucose, capillary     Status: Abnormal   Collection Time: 07/20/15  9:13 PM  Result Value Ref Range   Glucose-Capillary 223 (H) 65 - 99 mg/dL  Glucose, capillary     Status: Abnormal   Collection Time: 07/21/15  6:23 AM  Result Value Ref Range   Glucose-Capillary 185 (H) 65 - 99 mg/dL    Blood Alcohol level:  Lab Results  Component Value Date   ETH <5 07/17/2015   ETH <5 05/29/2015    Physical Findings: AIMS: Facial and Oral Movements Muscles of Facial Expression: None, normal Lips and Perioral Area: None, normal Jaw: None, normal Tongue: None, normal,Extremity Movements Upper (arms, wrists, hands, fingers): None, normal Lower (legs, knees, ankles, toes): None, normal, Trunk Movements Neck, shoulders, hips: None, normal, Overall Severity Severity of abnormal movements (highest score from questions above): None, normal Incapacitation due to abnormal movements: None, normal Patient's awareness of abnormal movements (rate only patient's report): No Awareness, Dental Status Current problems with teeth and/or dentures?: No Does patient usually wear dentures?: No  CIWA:  CIWA-Ar Total: 0 COWS:     Musculoskeletal: Strength & Muscle Tone: within normal limits Gait & Station: normal Patient leans: N/A  Psychiatric Specialty Exam: Review of Systems  Psychiatric/Behavioral: Positive for depression. Negative for suicidal ideas and hallucinations. The patient is nervous/anxious.   All other systems reviewed and are negative.   Blood  pressure 121/83, pulse 117, temperature 98.4 F (36.9 C), temperature source Oral, resp. rate 18, height 5\' 6"  (1.676 m), weight 124.739 kg (275 lb).Body mass index is 44.41 kg/(m^2).   General Appearance: Fairly Groomed  Engineer, water:: Good  Speech: Normal Rate-   Volume: Normal  Mood:  Depressed  Affect: Congruent  Thought Process: Linear  Orientation: Full (Time, Place, and Person)  Thought Content: Obsessions and Rumination  Suicidal Thoughts:  denies any current plan or intention of hurting self and contracts for safety on unit   Homicidal Thoughts: No  Memory: recent and remote grossly intact , Immediate - fair  Judgement: Fair  Insight: Fair  Psychomotor Activity: Normal  Concentration: Good  Recall: Good  Fund of Knowledge:Good  Language: Good  Akathisia: Negative  Handed: Right  AIMS (if indicated):    Assets: Communication Skills Desire for Improvement Resilience  ADL's: Intact  Cognition: WNL  Sleep: Number of Hours: 6       Treatment Plan Summary: - Admit for crisis management and mood stabilization.  - Medication management to re-stabilize current mood symptoms.   Medications: cotinue Luvox to 50 mg po bid for affective sx.   Buspar to 15 mg po tid for anxiety sx.  Will continue Trazodone 50 mg po qhs for sleep.  Haldol 5 mg po qhs for paranoia . (improving)  CBGs for DM.  - Group counseling sessions for coping skills - Medical consults as needed - Review and reinstate any pertinent home medications for other health problems  Merian Capron, MD  07/21/2015, 11:33 AM

## 2015-07-21 NOTE — Progress Notes (Signed)
D: Pt presents with flat affect and depressed mood. Pt reports good sleep and appetite. Pt denies suicidal thoughts this morning. Pt denies AVH. Pt verbalized that her bedtime meds are effective and she prefers to stay on them. Pt have minimal interaction and forwards little informations. No complaints verbalized by pt.  A: medications administered as ordered per MD. Verbal support provided . Pt encouraged to attend groups. 15 minute checks performed for safety. R: Pt stated goal "attend groups". Pt verbalized understanding of med regimen.

## 2015-07-21 NOTE — Progress Notes (Signed)
D Pt. Denies SI and HI, denies A and VH this pm was laughing and smiling when writer first assessed her.  Pt. Later became very tearful and anxious during group thinking that a peer was laughing at her and making fun of her because she is slow during group.  A Writer offered support and encouragement,  Discussed coping skills with pt as well as medication to help her calm down.    R Pt. Did eventually calm and then became upset with herself because she has such low self esteem. Pt. Stated " I wish that I did not think bad things, I know now that my boyfriend was not cheating on me, Do you think I can go home tomorrow and get out of this place I am not usually on this hall and I do not like it here".  Pt. Was concerned that writer would tell the MD that she had got upset and it would delay her discharge.   Pt. States that she knows that she is slow and it hurts her feelings when people make fun of her.  Staff stated that the peer was not actually making fun of her but the unit has been very active and loud this pm causing anxiety in several patients. Pt. Remains safe on the unit.

## 2015-07-21 NOTE — [facility] Group Notes (Signed)
[place] Group Notes:  (Clinical Social Work)  07/21/2015  11:00AM-12:00PM  Summary of Progress/Problems:  The main focus of today's process group was to listen to a variety of genres of music and to identify that different types of music provoke different responses.  The patient then was able to identify personally what was soothing for them, as well as energizing, as well as how patient can personally use this knowledge in sleep habits, with depression, and with other symptoms.  The patient expressed at the beginning of group the overall feeling of "great" and she sat calmly throughout group, obviously enjoying the music, smiling and even singing and dancing a little.  Type of Therapy:  Music Therapy   Participation Level:  Active  Participation Quality:  Attentive and Sharing  Affect:  Blunted  Cognitive:  Oriented  Insight:  Engaged  Engagement in Therapy:  Engaged  Modes of Intervention:   Activity, Exploration  Selmer Dominion, LCSW 07/21/2015

## 2015-07-22 DIAGNOSIS — F411 Generalized anxiety disorder: Secondary | ICD-10-CM

## 2015-07-22 DIAGNOSIS — R4183 Borderline intellectual functioning: Secondary | ICD-10-CM

## 2015-07-22 DIAGNOSIS — F639 Impulse disorder, unspecified: Secondary | ICD-10-CM

## 2015-07-22 LAB — GLUCOSE, CAPILLARY
GLUCOSE-CAPILLARY: 172 mg/dL — AB (ref 65–99)
GLUCOSE-CAPILLARY: 268 mg/dL — AB (ref 65–99)
GLUCOSE-CAPILLARY: 232 mg/dL — AB (ref 65–99)
Glucose-Capillary: 230 mg/dL — ABNORMAL HIGH (ref 65–99)

## 2015-07-22 MED ORDER — FLUVOXAMINE MALEATE 50 MG PO TABS
50.0000 mg | ORAL_TABLET | Freq: Every day | ORAL | Status: DC
  Administered 2015-07-22: 50 mg via ORAL
  Filled 2015-07-22 (×2): qty 1

## 2015-07-22 MED ORDER — FLUVOXAMINE MALEATE 50 MG PO TABS
75.0000 mg | ORAL_TABLET | Freq: Every day | ORAL | Status: DC
  Administered 2015-07-23: 75 mg via ORAL
  Filled 2015-07-22 (×2): qty 2

## 2015-07-22 MED ORDER — HALOPERIDOL 5 MG PO TABS
7.5000 mg | ORAL_TABLET | Freq: Every day | ORAL | Status: DC
Start: 2015-07-22 — End: 2015-07-23
  Administered 2015-07-22: 7.5 mg via ORAL
  Filled 2015-07-22 (×2): qty 2

## 2015-07-22 NOTE — Progress Notes (Signed)
D: Pt presents with flat affect and depressed mood. Pt reported good sleep and appetite. Pt denies suicidal thoughts. Pt denies depression and anxiety. Pt verbalized that she no longer feels paranoid today about other pts talking about her. Pt compliant with taking meds. No adverse reactions to meds verbalized by pt. Pt compliant with tx. Pt requesting discharge to return back to her mother's house.  A: Medications administered as ordered per MD. Orders reviewed by writer. Verbal support provided. Pt encouraged to attend groups. 15 minute checks performed for safety.  R: Pt stated goal "attend groups". Pt receptive to tx.

## 2015-07-22 NOTE — Progress Notes (Addendum)
Recreation Therapy Notes  03.27.2017 approximately 2:40pm. LRT returned with community resources for patient. Patient provided information on programs for developmentally disabled individuals through the [facility] and [employer]. Programs will offer patient participation in social and physical programs in her community. Transportation to some programs is offered by [employer], which should facilitate patient participation. Patient counseled on benefit of engaging in her community. Patient asked LRT about medications for weight loss. LRT advised patient to speak with MD about these concerns, as well as explained importance of physical activity. One program offered through the city is a fitness program, LRT counseled patient on benefits of participating in this program. Additionally patient provided information on the programs offered through the [facility] of [place]. LRT highlighted art focused programs to encourage patient emotional expression, which could potentially assist with reducing patient agitation, thus reducing the times she "acts out."   Patient encouraged to show resources to her mother and sister post d/c and to have them help her get registered in programs. Patient acknowledged understanding of instructions and was receptive to resources.   Laureen Ochs Dequan Kindred, LRT/CTRS   Lane Hacker 07/22/2015 3:48 PM

## 2015-07-22 NOTE — [facility] Group Notes (Signed)
[place] LCSW Group Therapy  07/22/2015 1:15 pm  Type of Therapy: Process Group Therapy  Participation Level:  Active  Participation Quality:  Appropriate  Affect:  Flat  Cognitive:  Oriented  Insight:  Improving  Engagement in Group:  Limited  Engagement in Therapy:  Limited  Modes of Intervention:  Activity, Clarification, Education, Problem-solving and Support  Summary of Progress/Problems: Today's group addressed the issue of overcoming obstacles.  Patients were asked to identify their biggest obstacle post d/c that stands in the way of their on-going success, and then problem solve as to how to manage this.  Stayed the entire time, engaged throughout.  However, when asked to contribute, declined.  After group she shyly told me 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.  That is why I did not talk in group."  [name] 07/22/2015   3:07 PM

## 2015-07-22 NOTE — Progress Notes (Signed)
Adult Psychoeducational Group Note  Date:  07/22/2015 Time:  9:57 PM  Group Topic/Focus:  Wrap-Up Group:   The focus of this group is to help patients review their daily goal of treatment and discuss progress on daily workbooks.  Participation Level:  Minimal  Participation Quality:  Appropriate  Affect:  Appropriate  Cognitive:  Lacking  Insight: Limited  Engagement in Group:  Engaged  Modes of Intervention:  Socialization and Support  Additional Comments:  Patient attended and participated in group tonight. She reports having a good day. She went outside and to the gym with the group today. Although she had a good day she was ready to leave.  Salley Scarlet [facility] 07/22/2015, 9:57 PM

## 2015-07-22 NOTE — Progress Notes (Signed)
Patient ID: [name], female   DOB: 1990/12/15, 25 y.o.   MRN: [id number] [facility] MD Progress Note  07/22/2015 12:28 PM ORPAH MCMURDIE  MRN:  [id number] Subjective:  Patient states " I am all right.'   Objective:  [name] is a 25 y.o. [name], who has a hx of MDD, impulse control disorder as well as mild /moderate ID and learning disability, lives with her mother in [place], who presented to [facility] from [place] with IVC papers.   On evaluation today. Patient today continues to be seen as depressed, does have some paranoia , mostly when she is around people. As per staff - pt continues to have periodic crying spells as well as anxiety /mood lability on the unit when patient believes people are making fun of her . Pt has been tolerating her medications well, denies side effects. Will continue to need a lot of support and encouragement.    Principal Problem: MDD (major depressive disorder), recurrent, severe, with psychosis ([place]) Diagnosis:   Patient Active Problem List   Diagnosis Date Noted  . 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 Learning disability [F81.9] 07/18/2015  . 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 Diabetes mellitus ([place]) [E11.9] 07/18/2015  . MDD (major depressive disorder), recurrent, severe, with psychosis ([place]) [F33.3] 07/18/2015  . Hyperlipidemia [E78.5] 07/18/2015  . Drug overdose [T50.901A]   . Cognitive deficits [R41.89] 10/12/2012  . Generalized anxiety disorder [F41.1] 06/28/2012   Total Time spent with patient: 30 minutes  Past Psychiatric History: see above noted  Past Medical History:  Past Medical History  Diagnosis Date  . Anxiety   . Mental disorder   . Depression   . Cognitive deficits   . Bipolar 1 disorder ([place])   . Diabetes mellitus without complication ([place])   . Obesity     Past Surgical History  Procedure Laterality Date  . Cesarean section    . Tonsillectomy N/A 06/03/2012    Procedure: TONSILLECTOMY;   Surgeon: [name], MD;  Location: [place];  Service: ENT;  Laterality: N/A;  . Mass excision N/A 06/03/2012    Procedure: EXCISION MASS;  Surgeon: [name], MD;  Location: [place];  Service: ENT;  Laterality: N/A;  Excision uvula mass  . Cesarean section N/A 04/25/2013    Procedure: REPEAT CESAREAN SECTION;  Surgeon: [name], MD;  Location: [facility];  Service: Obstetrics;  Laterality: N/A;  . Tonsillectomy     Family History:  Family History  Problem Relation Age of Onset  . Hypertension Mother   . Diabetes Father    Family Psychiatric  History: Please see H&p Social History:  History  Alcohol Use No     History  Drug Use No    Comment: Patient denies    Social History   Social History  . Marital Status: Single    Spouse Name: N/A  . Number of Children: N/A  . Years of Education: N/A   Social History Main Topics  . Smoking status: Current Every Day Smoker -- 0.50 packs/day for 7 years    Types: Cigarettes  . Smokeless tobacco: Never Used  . Alcohol Use: No  . Drug Use: No     Comment: Patient denies  . Sexual Activity: No   Other Topics Concern  . None   Social History Narrative     Current Medications: Current Facility-Administered Medications  Medication Dose Route Frequency Provider Last Rate Last Dose  . acetaminophen (TYLENOL)  tablet 650 mg  650 mg Oral Q6H PRN Laverle Hobby, PA-C      . alum & mag hydroxide-simeth (MAALOX/MYLANTA) 200-200-20 MG/5ML suspension 30 mL  30 mL Oral Q4H PRN Laverle Hobby, PA-C      . benztropine (COGENTIN) tablet 0.5 mg  0.5 mg Oral QHS Ursula Alert, MD   0.5 mg at 07/21/15 2107  . busPIRone (BUSPAR) tablet 15 mg  15 mg Oral TID Ursula Alert, MD   15 mg at 07/22/15 1155  . fluticasone (FLONASE) 50 MCG/ACT nasal spray 2 spray  2 spray Each Nare Daily Laverle Hobby, PA-C   2 spray at 07/22/15 0756  . fluvoxaMINE (LUVOX) tablet 50 mg  50 mg Oral QHS Ursula Alert, MD       . Derrill Memo ON 07/23/2015] fluvoxaMINE (LUVOX) tablet 75 mg  75 mg Oral Q breakfast Al Bracewell, MD      . glipiZIDE (GLUCOTROL XL) 24 hr tablet 5 mg  5 mg Oral Q breakfast Laverle Hobby, PA-C   5 mg at 07/22/15 0756  . haloperidol (HALDOL) tablet 7.5 mg  7.5 mg Oral QHS Quenesha Douglass, MD      . insulin aspart (novoLOG) injection 0-15 Units  0-15 Units Subcutaneous TID WC Ursula Alert, MD   5 Units at 07/22/15 1155  . insulin aspart (novoLOG) injection 0-5 Units  0-5 Units Subcutaneous QHS Ursula Alert, MD   2 Units at 07/21/15 2106  . loratadine (CLARITIN) tablet 10 mg  10 mg Oral Daily Laverle Hobby, PA-C   10 mg at 07/22/15 0756  . magnesium hydroxide (MILK OF MAGNESIA) suspension 30 mL  30 mL Oral Daily PRN Laverle Hobby, PA-C      . metFORMIN (GLUCOPHAGE) tablet 500 mg  500 mg Oral BID Laverle Hobby, PA-C   500 mg at 07/22/15 0756  . nicotine polacrilex (NICORETTE) gum 2 mg  2 mg Oral PRN Merian Capron, MD   2 mg at 07/21/15 1049  . risperiDONE (RISPERDAL M-TABS) disintegrating tablet 2 mg  2 mg Oral Q8H PRN Laverle Hobby, PA-C   2 mg at 07/21/15 2109   And  . ziprasidone (GEODON) injection 20 mg  20 mg Intramuscular PRN Laverle Hobby, PA-C      . simvastatin (ZOCOR) tablet 20 mg  20 mg Oral q1800 Ursula Alert, MD   20 mg at 07/21/15 1712  . traZODone (DESYREL) tablet 50 mg  50 mg Oral QHS,MR X 1 Laverle Hobby, PA-C   50 mg at 07/21/15 2240    Lab Results:  Results for orders placed or performed during the hospital encounter of 07/18/15 (from the past 48 hour(s))  Glucose, capillary     Status: Abnormal   Collection Time: 07/20/15  4:34 PM  Result Value Ref Range   Glucose-Capillary 254 (H) 65 - 99 mg/dL   Comment 1 Notify RN    Comment 2 Document in Chart   Glucose, capillary     Status: Abnormal   Collection Time: 07/20/15  9:13 PM  Result Value Ref Range   Glucose-Capillary 223 (H) 65 - 99 mg/dL  Glucose, capillary     Status: Abnormal   Collection Time:  07/21/15  6:23 AM  Result Value Ref Range   Glucose-Capillary 185 (H) 65 - 99 mg/dL  Glucose, capillary     Status: Abnormal   Collection Time: 07/21/15 12:11 PM  Result Value Ref Range   Glucose-Capillary 201 (H) 65 - 99  mg/dL   Comment 1 Notify RN    Comment 2 Document in Chart   Glucose, capillary     Status: Abnormal   Collection Time: 07/21/15  5:01 PM  Result Value Ref Range   Glucose-Capillary 209 (H) 65 - 99 mg/dL   Comment 1 Notify RN    Comment 2 Document in Chart   Glucose, capillary     Status: Abnormal   Collection Time: 07/21/15  8:19 PM  Result Value Ref Range   Glucose-Capillary 222 (H) 65 - 99 mg/dL  Glucose, capillary     Status: Abnormal   Collection Time: 07/22/15  6:30 AM  Result Value Ref Range   Glucose-Capillary 172 (H) 65 - 99 mg/dL  Glucose, capillary     Status: Abnormal   Collection Time: 07/22/15 11:54 AM  Result Value Ref Range   Glucose-Capillary 230 (H) 65 - 99 mg/dL   Comment 1 Notify RN    Comment 2 Document in Chart     Blood Alcohol level:  Lab Results  Component Value Date   ETH <5 07/17/2015   ETH <5 05/29/2015    Physical Findings: AIMS: Facial and Oral Movements Muscles of Facial Expression: None, normal Lips and Perioral Area: None, normal Jaw: None, normal Tongue: None, normal,Extremity Movements Upper (arms, wrists, hands, fingers): None, normal Lower (legs, knees, ankles, toes): None, normal, Trunk Movements Neck, shoulders, hips: None, normal, Overall Severity Severity of abnormal movements (highest score from questions above): None, normal Incapacitation due to abnormal movements: None, normal Patient's awareness of abnormal movements (rate only patient's report): No Awareness, Dental Status Current problems with teeth and/or dentures?: No Does patient usually wear dentures?: No  CIWA:  CIWA-Ar Total: 0 COWS:     Musculoskeletal: Strength & Muscle Tone: within normal limits Gait & Station: normal Patient leans:  N/A  Psychiatric Specialty Exam: Review of Systems  Psychiatric/Behavioral: Positive for depression. Negative for suicidal ideas and hallucinations. The patient is nervous/anxious.   All other systems reviewed and are negative.   Blood pressure 132/84, pulse 100, temperature 98.4 F (36.9 C), temperature source Oral, resp. rate 17, height 5\' 6"  (1.676 m), weight 124.739 kg (275 lb).Body mass index is 44.41 kg/(m^2).   General Appearance: Fairly Groomed  Engineer, water:: Good  Speech: Normal Rate-   Volume: Normal  Mood:  Depressed  Affect: Congruent  Thought Process: Linear  Orientation: Full (Time, Place, and Person)  Thought Content: Obsessions and Rumination, paranoia  Suicidal Thoughts:  denies any current plan or intention of hurting self and contracts for safety on unit   Homicidal Thoughts: No  Memory: recent and remote grossly intact , Immediate - fair  Judgement: Fair  Insight: Fair  Psychomotor Activity: Normal  Concentration: Good  Recall: Good  Fund of Knowledge:Good  Language: Good  Akathisia: Negative  Handed: Right  AIMS (if indicated):    Assets: Communication Skills Desire for Improvement Resilience  ADL's: Intact  Cognition: WNL  Sleep: Number of Hours: 6.5 hrs        Treatment Plan Summary:[name] is a 25 y.o. [name], who has a hx of MDD, impulse control disorder as well as ?mild /moderate ID and learning disability , lives with her mother in [place] , who presented to [facility] from [place] with IVC papers. Pt today seems to be anxious , depressed and paranoid, although progressing.  Daily contact with patient to assess and evaluate symptoms and progress in treatment, Medication management, Plan inpatient admission and medications as  below   Plan:  Reviewed past medical records,treatment plan from the week end. Will increase Haldol to 7.5 mg po qhs for mood lability/paranoia. Will continue Cogentin 0.5  mg po qhs for EPS. Will increase Luvox to 75 mg po daily for affective sx along with Luvox 50 mg po daily. Will continue Trazodone 50 mg po qhs for sleep. Will make available PRN medications as per agitation protocol. Will continue to monitor vitals ,medication compliance and treatment side effects while patient is here.  Will monitor for medical issues as well as call consult as needed.  CSW will continue working on disposition.  Patient to participate in therapeutic milieu .         Meagan Ancona, MD  07/22/2015, 12:28 PM

## 2015-07-23 LAB — GLUCOSE, CAPILLARY: Glucose-Capillary: 274 mg/dL — ABNORMAL HIGH (ref 65–99)

## 2015-07-23 MED ORDER — NICOTINE POLACRILEX 2 MG MT GUM: 2.0000 mg | CHEWING_GUM | OROMUCOSAL | Status: DC | PRN

## 2015-07-23 MED ORDER — FLUVOXAMINE MALEATE 25 MG PO TABS: 75.0000 mg | ORAL_TABLET | Freq: Every day | ORAL | Status: DC

## 2015-07-23 MED ORDER — HALOPERIDOL 0.5 MG PO TABS: 7.5000 mg | ORAL_TABLET | Freq: Every day | ORAL | Status: DC

## 2015-07-23 MED ORDER — FLUVOXAMINE MALEATE 50 MG PO TABS: 50.0000 mg | ORAL_TABLET | Freq: Every day | ORAL | Status: DC

## 2015-07-23 MED ORDER — SIMVASTATIN 20 MG PO TABS: 20.0000 mg | ORAL_TABLET | Freq: Every day | ORAL | Status: DC

## 2015-07-23 MED ORDER — TRAZODONE HCL 50 MG PO TABS: 50.0000 mg | ORAL_TABLET | Freq: Every evening | ORAL | Status: DC | PRN

## 2015-07-23 MED ORDER — CETIRIZINE HCL 10 MG PO TABS: 10.0000 mg | ORAL_TABLET | Freq: Every day | ORAL | Status: DC

## 2015-07-23 MED ORDER — METFORMIN HCL 500 MG PO TABS: 500.0000 mg | ORAL_TABLET | Freq: Two times a day (BID) | ORAL | Status: DC

## 2015-07-23 MED ORDER — BUSPIRONE HCL 15 MG PO TABS: 15.0000 mg | ORAL_TABLET | Freq: Three times a day (TID) | ORAL | Status: DC

## 2015-07-23 MED ORDER — GLIPIZIDE ER 5 MG PO TB24: 5.0000 mg | ORAL_TABLET | Freq: Every day | ORAL | Status: DC

## 2015-07-23 MED ORDER — BENZTROPINE MESYLATE 0.5 MG PO TABS: 0.5000 mg | ORAL_TABLET | Freq: Every day | ORAL | Status: DC

## 2015-07-23 NOTE — BHH Suicide Risk Assessment (Signed)
[place] INPATIENT:  Family/Significant Other Suicide Prevention Education  Suicide Prevention Education:  Education Completed; No one has been identified by the patient as the family member/significant other with whom the patient will be residing, and identified as the person(s) who will aid the patient in the event of a mental health crisis (suicidal ideations/suicide attempt).  With written consent from the patient, the family member/significant other has been provided the following suicide prevention education, prior to the and/or following the discharge of the patient.  The suicide prevention education provided includes the following:  Suicide risk factors  Suicide prevention and interventions  National Suicide Hotline telephone number  [facility] assessment telephone number  [facility] Emergency Assistance [place] and/or Residential Mobile Crisis Unit telephone number  Request made of family/significant other to:  Remove weapons (e.g., guns, rifles, knives), all items previously/currently identified as safety concern.    Remove drugs/medications (over-the-counter, prescriptions, illicit drugs), all items previously/currently identified as a safety concern.  The family member/significant other verbalizes understanding of the suicide prevention education information provided.  The family member/significant other agrees to remove the items of safety concern listed above. The patient did not endorse SI at the time of admission, nor did the patient c/o SI during the stay here.  SPE not required.   Roque Lias B 07/23/2015, 10:40 AM

## 2015-07-23 NOTE — Tx Team (Signed)
Interdisciplinary Treatment Plan Update (Adult)  Date:  07/23/2015   Time Reviewed:  8:24 AM   Progress in Treatment: Attending groups: No Participating in groups:  No Taking medication as prescribed:  Yes. Tolerating medication:  Yes. Family/Significant other contact made:  No Patient understands diagnosis:  Yes  As evidenced by seeking help with "depression, panic attacks and paranoia" Discussing patient identified problems/goals with staff:  Yes, see initial care plan. Medical problems stabilized or resolved:  Yes. Denies suicidal/homicidal ideation: Yes. Issues/concerns per patient self-inventory:  No. Other:  New problem(s) identified:  Discharge Plan or Barriers: return home, follow up outpt  Reason for Continuation of Hospitalization:   Comments:  25 year old female sent from [facility] with hyperglycemia. Patient states that she was at [facility] because she was depressed and initially suicidal but this has resolved. She states she missed 1 day of her psych meds now that she's back on she feels much better. She is barely involuntarily committed and the paperwork states that she has been experiencing "periods of agitation while the crisis center, with loud are getting on the phone. She reports she has had feelings of suicide with plans to overdose. She reports paranoid thoughts about her boyfriend and a bus driver, whom she believes is sending people to fight her." Patient states she feels much better. Her glucose was apparently at 350 yesterday. Thus today they sent her here. She states she is a symptomatic from this currently. Recently restarted on her diabetic meds.Will increase Luvox to 50 mg po bid for affective sx.  Will DC Cymbalta for lack of efficacy. Will increase Buspar to 15 mg po tid for anxiety sx. Will continue Trazodone 50 mg po qhs for sleep. Will add Haldol 5 mg po qhs for paranoia . Will restart home medications where indicated .   Estimated length of stay: D/C  today  New goal(s):  Review of initial/current patient goals per problem list:   Review of initial/current patient goals per problem list:  1. Goal(s): Patient will participate in aftercare plan   Met: Yes   Target date: 3-5 days post admission date   As evidenced by: Patient will participate within aftercare plan AEB aftercare provider and housing plan at discharge being identified. 07/18/15:  Return home, follow up outpt   2. Goal (s): Patient will exhibit decreased depressive symptoms and suicidal ideations.   Met: Yes   Target date: 3-5 days post admission date   As evidenced by: Patient will utilize self rating of depression at 3 or below and demonstrate decreased signs of depression or be deemed stable for discharge by MD. 07/18/15:  Rates her depression an 8 07/23/15:  [name] denies depression today     3. Goal(s): Patient will demonstrate decreased signs and symptoms of anxiety.   Met: Yes   Target date: 3-5 days post admission date   As evidenced by: Patient will utilize self rating of anxiety at 3 or below and demonstrated decreased signs of anxiety, or be deemed stable for discharge by MD 07/18/15:  Rates her anxiety a 7 07/23/15:  Pt denies anxiety today         5. Goal(s): Patient will demonstrate decreased signs of psychosis  * Met:Yes  * Target date: 3-5 days post admission date  * As evidenced by: Patient will demonstrate decreased frequency of AVH or return to baseline function 07/18/15:  Pt endorses paranoia 07/23/15:  No signs nor symptoms of psychosis today        Attendees: Patient:  07/23/2015 8:24 AM   Family:   07/23/2015 8:24 AM   Physician:  Ursula Alert, MD 07/23/2015 8:24 AM   Nursing:   Darrol Angel RN 07/23/2015 8:24 AM   CSW:    Roque Lias, LCSW   07/23/2015 8:24 AM   Other:  07/23/2015 8:24 AM   Other:   07/23/2015 8:24 AM   Other:  Lars Pinks, Nurse CM 07/23/2015 8:24 AM   Other:   07/23/2015 8:24 AM   Other:   Norberto Sorenson, [place]  07/23/2015 8:24 AM   Other:  07/23/2015 8:24 AM   Other:  07/23/2015 8:24 AM   Other:  07/23/2015 8:24 AM   Other:  07/23/2015 8:24 AM   Other:  07/23/2015 8:24 AM   Other:   07/23/2015 8:24 AM    Scribe for Treatment Team:   Trish Mage, 07/23/2015 8:24 AM

## 2015-07-23 NOTE — BHH Suicide Risk Assessment (Signed)
[facility] Discharge Suicide Risk Assessment   Principal Problem: MDD (major depressive disorder), recurrent, severe, with psychosis ([place]) Discharge Diagnoses:  Patient Active Problem List   Diagnosis Date Noted  . 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 Learning disability [F81.9] 07/18/2015  . 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 Diabetes mellitus ([place]) [E11.9] 07/18/2015  . MDD (major depressive disorder), recurrent, severe, with psychosis ([place]) [F33.3] 07/18/2015  . Hyperlipidemia [E78.5] 07/18/2015  . Drug overdose [T50.901A]   . Cognitive deficits [R41.89] 10/12/2012  . Generalized anxiety disorder [F41.1] 06/28/2012    Total Time spent with patient: 30 minutes  Musculoskeletal: Strength & Muscle Tone: within normal limits Gait & Station: normal Patient leans: N/A  Psychiatric Specialty Exam: Review of Systems  Psychiatric/Behavioral: Negative for depression, suicidal ideas and hallucinations. The patient is not nervous/anxious and does not have insomnia.   All other systems reviewed and are negative.   Blood pressure 135/101, pulse 124, temperature 98.5 F (36.9 C), temperature source Oral, resp. rate 20, height 5\' 6"  (1.676 m), weight 124.739 kg (275 lb).Body mass index is 44.41 kg/(m^2).  General Appearance: Casual  Eye Contact::  Fair  Speech:  Clear and [id number]  Volume:  Normal  Mood:  Euthymic  Affect:  Appropriate  Thought Process:  Coherent  Orientation:  Full (Time, Place, and Person)  Thought Content:  WDL  Suicidal Thoughts:  No  Homicidal Thoughts:  No  Memory:  Immediate;   Fair Recent;   Fair Remote;   Fair  Judgement:  Fair  Insight:  Fair  Psychomotor Activity:  Normal  Concentration:  Fair  Recall:  [employer] of Knowledge:Fair  Language: Fair  Akathisia:  No  Handed:  Right  AIMS (if indicated):   0  Assets:  Desire for Improvement  Sleep:  Number of Hours: 5.5  Cognition: WNL  ADL's:  Intact   Mental  Status Per Nursing Assessment::   On Admission:  Suicidal ideation indicated by patient  Demographic Factors:  NA  Loss Factors: NA  Historical Factors: Impulsivity  Risk Reduction Factors:   Positive social support  Continued Clinical Symptoms:  Previous Psychiatric Diagnoses and Treatments  Cognitive Features That Contribute To Risk:  None    Suicide Risk:  Minimal: No identifiable suicidal ideation.  Patients presenting with no risk factors but with morbid ruminations; may be classified as minimal risk based on the severity of the depressive symptoms  Follow-up Information    Follow up with [facility] On 08/01/2015.   Why:  Thursday the 6th at 12:30 with Dr Rosine Door.  Call them to confirm this time and date   Contact information:   [place] [facility]/Follow-up recommendations:  Activity:  No restrictions Diet:  carb modified Tests:  follow up on your prolactin level as per out patient recommendations Other:  follow up with after care  Dayvian Blixt, MD 07/23/2015, 9:23 AM

## 2015-07-23 NOTE — Progress Notes (Signed)
  [facility] Adult Case Management Discharge Plan :  Will you be returning to the same living situation after discharge:  Yes,  home At discharge, do you have transportation home?: Yes,  cab Do you have the ability to pay for your medications: Yes,  MCD  Release of information consent forms completed and in the chart;  Patient's signature needed at discharge.  Patient to Follow up at: Follow-up Information    Follow up with [facility] On 08/01/2015.   Why:  Thursday the 6th at 12:30 with Dr Rosine Door.  Call them to confirm this time and date   Contact information:   [place] [id number]      Next level of care provider has access to [place] and Suicide Prevention discussed: Yes,  yes  Have you used any form of tobacco in the last 30 days? (Cigarettes, Smokeless Tobacco, Cigars, and/or Pipes): No  Has patient been referred to the Quitline?: Yes, faxed on 07/23/15  Patient has been referred for addiction treatment: N/A  Trish Mage 07/23/2015, 1:46 PM

## 2015-07-23 NOTE — BHH Group Notes (Signed)
[facility] Notes:  (Nursing/MHT/Case Management/Adjunct)  Date:  07/23/2015  Time:  11:31 AM  Type of Therapy:  Nurse Education  Participation Level:  Did Not Attend  Mart Piggs 07/23/2015, 11:31 AM

## 2015-07-23 NOTE — Discharge Summary (Signed)
Physician Discharge Summary Note  Patient:  [name] is an 25 y.o., female MRN:  [id number] DOB:  Sep 03, 1990 Patient phone:  [phone] (home)  Patient address:   [place] [address],  Total Time spent with patient: 30 minutes  Date of Admission:  07/18/2015 Date of Discharge: 07/23/2015  Reason for Admission:  Worsening depression and suicidal ideation  Principal Problem: MDD (major depressive disorder), recurrent, severe, with psychosis [facility]) Discharge Diagnoses: Patient Active Problem List   Diagnosis Date Noted  . 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 Learning disability [F81.9] 07/18/2015  . 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 Diabetes mellitus ([place]) [E11.9] 07/18/2015  . MDD (major depressive disorder), recurrent, severe, with psychosis ([place]) [F33.3] 07/18/2015  . Hyperlipidemia [E78.5] 07/18/2015  . Drug overdose [T50.901A]   . Cognitive deficits [R41.89] 10/12/2012  . Generalized anxiety disorder [F41.1] 06/28/2012    Past Psychiatric History:  See above noted   Past Medical History:  Past Medical History  Diagnosis Date  . Anxiety   . Mental disorder   . Depression   . Cognitive deficits   . Bipolar 1 disorder ([place])   . Diabetes mellitus without complication ([place])   . Obesity     Past Surgical History  Procedure Laterality Date  . Cesarean section    . Tonsillectomy N/A 06/03/2012    Procedure: TONSILLECTOMY;  Surgeon: [name], MD;  Location: [place];  Service: ENT;  Laterality: N/A;  . Mass excision N/A 06/03/2012    Procedure: EXCISION MASS;  Surgeon: [name], MD;  Location: [place];  Service: ENT;  Laterality: N/A;  Excision uvula mass  . Cesarean section N/A 04/25/2013    Procedure: REPEAT CESAREAN SECTION;  Surgeon: [name], MD;  Location: [facility];  Service: Obstetrics;  Laterality: N/A;  . Tonsillectomy     Family History:  Family History   Problem Relation Age of Onset  . Hypertension Mother   . Diabetes Father    Family Psychiatric  History:  See above noted Social History:  History  Alcohol Use No     History  Drug Use No    Comment: Patient denies    Social History   Social History  . Marital Status: Single    Spouse Name: N/A  . Number of Children: N/A  . Years of Education: N/A   Social History Main Topics  . Smoking status: Current Every Day Smoker -- 0.50 packs/day for 7 years    Types: Cigarettes  . Smokeless tobacco: Never Used  . Alcohol Use: No  . Drug Use: No     Comment: Patient denies  . Sexual Activity: No   Other Topics Concern  . None   Social History Narrative    Hospital Course:  [name] is a 25 y.o. [name], hx of MDD, impulse control disorder as well as mild /moderate learning disability was IVC'd by [employer].  Patient stated that [name] was brought to [facility] for medical issues (elevated CBG's).  Furthermore, patient had missed 1 day of her medication regimen which included Cymbalta, Buspar and Luvox which caused her to become more depressed and suicidal.    [name] was admitted for MDD (major depressive disorder), recurrent, severe, with psychosis ([place]) and crisis management.  [name] was treated with Haldol to 7.5 mg for mood lability/paranoia, Cogentin 0.5 mg for EPS, Luvox 75 mg  daily for affective symptoms along with Luvox 50 mg po  daily, Trazodone 50 mg for sleep and as needed medications as per agitation protocol..  Medical problems were identified and treated as needed.  Home medications were restarted as appropriate.  Improvement was monitored by observation and [name] daily report of symptom reduction.  Emotional and mental status was monitored by daily self inventory reports completed by [name] and clinical staff.  Patient reported continued improvement, denied any new concerns.  Patient had been compliant on medications and denied side effects.  Support  and encouragement was provided.    Patient did well during inpatient stay.  At time of discharge, patient rated both depression and anxiety levels to be manageable and minimal.  Patient was able to identify the triggers of emotional crises and de-stabilizations.  Patient identified the positive things in life that would help in dealing with feelings of loss, depression and unhealthy or abusive tendencies.         [name] was evaluated by the treatment team for stability and plans for continued recovery upon discharge.  [name] was offered further treatment options upon discharge including Residential, Intensive Outpatient and Outpatient treatment.  [name] will follow up with agencies listed below for medication management and counseling.  Encouraged patient to maintain satisfactory support network and home environment.  Advised to adhere to medication compliance and outpatient treatment follow up.      [name] motivation was an integral factor for scheduling further treatment.  Employment, transportation, bed availability, health status, family support, and any pending legal issues were also considered during her hospital stay.  Upon completion of this admission the patient was both mentally and medically stable for discharge denying suicidal/homicidal ideation, auditory/visual/tactile hallucinations, delusional thoughts and paranoia.      Physical Findings: AIMS: Facial and Oral Movements Muscles of Facial Expression: None, normal Lips and Perioral Area: None, normal Jaw: None, normal Tongue: None, normal,Extremity Movements Upper (arms, wrists, hands, fingers): None, normal Lower (legs, knees, ankles, toes): None, normal, Trunk Movements Neck, shoulders, hips: None, normal, Overall Severity Severity of abnormal movements (highest score from questions above): None, normal Incapacitation due to abnormal movements: None, normal Patient's awareness of abnormal movements (rate only patient's  report): No Awareness, Dental Status Current problems with teeth and/or dentures?: No Does patient usually wear dentures?: No  CIWA:  CIWA-Ar Total: 0 COWS:     Musculoskeletal: Strength & Muscle Tone: within normal limits Gait & Station: normal Patient leans: N/A  Psychiatric Specialty Exam:  SEE MD SRA Review of Systems  Psychiatric/Behavioral: Negative for depression, suicidal ideas and hallucinations.  All other systems reviewed and are negative.   Blood pressure 135/101, pulse 124, temperature 98.5 F (36.9 C), temperature source Oral, resp. rate 20, height 5\' 6"  (1.676 m), weight 124.739 kg (275 lb).Body mass index is 44.41 kg/(m^2).  Have you used any form of tobacco in the last 30 days? (Cigarettes, Smokeless Tobacco, Cigars, and/or Pipes): No  Has this patient used any form of tobacco in the last 30 days? (Cigarettes, Smokeless Tobacco, Cigars, and/or Pipes) Yes, Rx given  Blood Alcohol level:  Lab Results  Component Value Date   [facility] <5 07/17/2015   ETH <5 [id number]    Metabolic Disorder Labs:  Lab Results  Component Value Date   HGBA1C 7.8* 07/18/2015   MPG 177 07/18/2015   MPG 166 05/11/2015   Lab Results  Component Value Date   PROLACTIN 37.3* 07/18/2015   PROLACTIN 42.0* 05/11/2015   Lab Results  Component Value Date  CHOL 212* 07/18/2015   TRIG 496* 07/18/2015   HDL 40* 07/18/2015   CHOLHDL 5.3 07/18/2015   VLDL UNABLE TO CALCULATE IF TRIGLYCERIDE OVER 400 mg/dL 07/18/2015   LDLCALC UNABLE TO CALCULATE IF TRIGLYCERIDE OVER 400 mg/dL 07/18/2015   LDLCALC 116* 05/11/2015    See Psychiatric Specialty Exam and Suicide Risk Assessment completed by Attending Physician prior to discharge.  Discharge destination:  Home  Is patient on multiple antipsychotic therapies at discharge:  No   Has Patient had three or more failed trials of antipsychotic monotherapy by history:  No  Recommended Plan for Multiple Antipsychotic Therapies: NA     Medication  List    STOP taking these medications        doxycycline 100 MG capsule  Commonly known as:  VIBRAMYCIN     DULoxetine 60 MG capsule  Commonly known as:  CYMBALTA     fluticasone 50 MCG/ACT nasal spray  Commonly known as:  FLONASE     ibuprofen 800 MG tablet  Commonly known as:  ADVIL,MOTRIN     metroNIDAZOLE 500 MG tablet  Commonly known as:  FLAGYL     nicotine 21 mg/24hr patch  Commonly known as:  NICODERM CQ - dosed in mg/24 hours     oxyCODONE-acetaminophen 5-325 MG tablet  Commonly known as:  PERCOCET/ROXICET      TAKE these medications      Indication   benztropine 0.5 MG tablet  Commonly known as:  COGENTIN  Take 1 tablet (0.5 mg total) by mouth at bedtime.   Indication:  Extrapyramidal Reaction caused by Medications     busPIRone 15 MG tablet  Commonly known as:  BUSPAR  Take 1 tablet (15 mg total) by mouth 3 (three) times daily.   Indication:  Anxiety Disorder     cetirizine 10 MG tablet  Commonly known as:  ZYRTEC  Take 1 tablet (10 mg total) by mouth daily. For allergies   Indication:  Perennial Rhinitis, Hayfever     fluvoxaMINE 25 MG tablet  Commonly known as:  LUVOX  Take 3 tablets (75 mg total) by mouth daily with breakfast.   Indication:  Depression     fluvoxaMINE 50 MG tablet  Commonly known as:  LUVOX  Take 1 tablet (50 mg total) by mouth at bedtime.   Indication:  Depression     glipiZIDE 5 MG 24 hr tablet  Commonly known as:  GLUCOTROL XL  Take 1 tablet (5 mg total) by mouth daily with breakfast.   Indication:  Type 2 Diabetes     haloperidol 0.5 MG tablet  Commonly known as:  HALDOL  Take 15 tablets (7.5 mg total) by mouth at bedtime.   Indication:  Psychosis     metFORMIN 500 MG tablet  Commonly known as:  GLUCOPHAGE  Take 1 tablet (500 mg total) by mouth 2 (two) times daily. For diabetes management   Indication:  Type 2 Diabetes     nicotine polacrilex 2 MG gum  Commonly known as:  NICORETTE  Take 1 each (2 mg total) by  mouth as needed for smoking cessation.   Indication:  Nicotine Addiction     simvastatin 20 MG tablet  Commonly known as:  ZOCOR  Take 1 tablet (20 mg total) by mouth daily at 6 PM.   Indication:  high cholesterol     traZODone 50 MG tablet  Commonly known as:  DESYREL  Take 1 tablet (50 mg total) by mouth at bedtime and may  repeat dose one time if needed.   Indication:  Trouble Sleeping           Follow-up Information    Follow up with [facility] On 08/01/2015.   Why:  Thursday the 6th at 12:30 with Dr Rosine Door.  Call them to confirm this time and date   Contact information:   [place] [phone]      Follow-up recommendations:  Activity:  as tol Diet:  as tol  Comments:  1.  Take all your medications as prescribed.   2.  Report any adverse side effects to outpatient provider. 3.  Patient instructed to not use alcohol or illegal drugs while on prescription medicines. 4.  In the event of worsening symptoms, instructed patient to call 911, the crisis hotline or go to nearest emergency room for evaluation of symptoms.  Signed: Janett Labella, NP [facility] 07/23/2015, 12:44 PM

## 2015-07-23 NOTE — Progress Notes (Signed)
Discharge note: Pt received both written and verbal discharge instructions. Pt verbalized understanding of discharge instructions. Pt agreed to f/u appt and med regimen. Pt received prescriptions, taxi voucher, SRA, AVS and trans record. Pt received belongings from room and locker. Pt safely discharged to lobby.

## 2015-07-23 NOTE — Progress Notes (Signed)
Pt is isolative and withdrawn to room. Pt denies any depression, anxiety, pain, SI, HI or AVH; state, "I feel safe here; I feel good; there is nobody after me anymore." Pt was however observed pacing the hall several times. Pt is none compliant with here diabetic diet; Pt had several snacks stored in her room. Pt remained calm and cooperative. A: Medications offered as prescribed.  Support, encouragement, and safe environment provided.  15-minute safety checks continue. R: Pt was med compliant.  Pt attended wrap-up group. Safety checks continue.

## 2015-07-31 ENCOUNTER — Encounter (HOSPITAL_COMMUNITY): Payer: Self-pay | Admitting: Emergency Medicine

## 2015-07-31 ENCOUNTER — Emergency Department (HOSPITAL_COMMUNITY)
Admission: EM | Admit: 2015-07-31 | Discharge: 2015-07-31 | Disposition: A | Discharge status: Home / Self Care | Payer: Medicaid Other | Attending: Emergency Medicine | Admitting: Emergency Medicine

## 2015-07-31 DIAGNOSIS — R109 Unspecified abdominal pain: Secondary | ICD-10-CM | POA: Insufficient documentation

## 2015-07-31 DIAGNOSIS — E119 Type 2 diabetes mellitus without complications: Secondary | ICD-10-CM | POA: Diagnosis not present

## 2015-07-31 DIAGNOSIS — E669 Obesity, unspecified: Secondary | ICD-10-CM | POA: Diagnosis not present

## 2015-07-31 LAB — COMPREHENSIVE METABOLIC PANEL
ALK PHOS: 57 U/L (ref 38–126)
ALT: 32 U/L (ref 14–54)
ANION GAP: 7 (ref 5–15)
AST: 27 U/L (ref 15–41)
Albumin: 4.3 g/dL (ref 3.5–5.0)
BUN: 14 mg/dL (ref 6–20)
CALCIUM: 9.3 mg/dL (ref 8.9–10.3)
CO2: 26 mmol/L (ref 22–32)
CREATININE: 0.69 mg/dL (ref 0.44–1.00)
Chloride: 109 mmol/L (ref 101–111)
GFR calc non Af Amer: >60 mL/min (ref >60)
Glucose, Bld: 122 mg/dL — ABNORMAL HIGH (ref 65–99)
Potassium: 3.7 mmol/L (ref 3.5–5.1)
SODIUM: 142 mmol/L (ref 135–145)
TOTAL PROTEIN: 7.8 g/dL (ref 6.5–8.1)
Total Bilirubin: 0.5 mg/dL (ref 0.3–1.2)

## 2015-07-31 LAB — CBC
HCT: 38.7 % (ref 36.0–46.0)
Hemoglobin: 12.6 g/dL (ref 12.0–15.0)
MCH: 26.2 pg (ref 26.0–34.0)
MCHC: 32.6 g/dL (ref 30.0–36.0)
MCV: 80.5 fL (ref 78.0–100.0)
PLATELETS: 232 10*3/uL (ref 150–400)
RBC: 4.81 MIL/uL (ref 3.87–5.11)
RDW: 14 % (ref 11.5–15.5)
WBC: 10.2 10*3/uL (ref 4.0–10.5)

## 2015-07-31 NOTE — ED Notes (Signed)
Pt transported from parking lot for c/o abd pain x 4 months per EMS. Pt states she was seen by PCP yesterday, given meds but now states pain is worse.

## 2015-07-31 NOTE — ED Notes (Signed)
Pt states she was seen by her doctor and was told she has bacteria in her stomach and was given Lansoprazol-amoxicil-clarithromycin and to take it twice a day for 14 days  Pt states today the pain is worse, she feels weak, and that she has been vomiting

## 2015-07-31 NOTE — ED Notes (Signed)
Patient witnessed by this writer to be leaving ED. Steady gait, in NAD.

## 2015-08-27 ENCOUNTER — Encounter (HOSPITAL_COMMUNITY): Payer: Self-pay

## 2015-08-27 ENCOUNTER — Emergency Department (HOSPITAL_COMMUNITY)
Admission: EM | Admit: 2015-08-27 | Discharge: 2015-08-27 | Disposition: A | Discharge status: Home / Self Care | Payer: Medicaid Other | Attending: Emergency Medicine | Admitting: Emergency Medicine

## 2015-08-27 DIAGNOSIS — E669 Obesity, unspecified: Secondary | ICD-10-CM | POA: Diagnosis not present

## 2015-08-27 DIAGNOSIS — E119 Type 2 diabetes mellitus without complications: Secondary | ICD-10-CM | POA: Insufficient documentation

## 2015-08-27 DIAGNOSIS — R531 Weakness: Secondary | ICD-10-CM | POA: Insufficient documentation

## 2015-08-27 NOTE — ED Notes (Signed)
Called for in lobby, no answer

## 2015-08-27 NOTE — ED Notes (Addendum)
Pt states she has had weakness for a while and sleepy all the time.  Denies any other physical symptoms.  Pt states no n/v.  No pain.  No fever.  Just feels "sleepy" all the time.  Pt does take her meds for her depression.  Pt denies SI although has hx.  Pt states she just started haldol a month ago although she has had the "sleepiness for a year or so".  Has md appt tomorrow but did not want to be too tired to make it to appt.

## 2015-09-16 ENCOUNTER — Encounter (HOSPITAL_COMMUNITY): Payer: Self-pay | Admitting: *Deleted

## 2015-09-16 ENCOUNTER — Emergency Department (HOSPITAL_COMMUNITY)
Admission: EM | Admit: 2015-09-16 | Discharge: 2015-09-16 | Disposition: A | Discharge status: Home / Self Care | Payer: Medicaid Other | Attending: Emergency Medicine | Admitting: Emergency Medicine

## 2015-09-16 DIAGNOSIS — F319 Bipolar disorder, unspecified: Secondary | ICD-10-CM | POA: Diagnosis not present

## 2015-09-16 DIAGNOSIS — S40862A Insect bite (nonvenomous) of left upper arm, initial encounter: Secondary | ICD-10-CM | POA: Diagnosis not present

## 2015-09-16 DIAGNOSIS — Z87891 Personal history of nicotine dependence: Secondary | ICD-10-CM | POA: Insufficient documentation

## 2015-09-16 DIAGNOSIS — Y9289 Other specified places as the place of occurrence of the external cause: Secondary | ICD-10-CM | POA: Diagnosis not present

## 2015-09-16 DIAGNOSIS — Z880 Allergy status to penicillin: Secondary | ICD-10-CM | POA: Diagnosis not present

## 2015-09-16 DIAGNOSIS — Z79899 Other long term (current) drug therapy: Secondary | ICD-10-CM | POA: Diagnosis not present

## 2015-09-16 DIAGNOSIS — Y9389 Activity, other specified: Secondary | ICD-10-CM | POA: Insufficient documentation

## 2015-09-16 DIAGNOSIS — Z7984 Long term (current) use of oral hypoglycemic drugs: Secondary | ICD-10-CM | POA: Insufficient documentation

## 2015-09-16 DIAGNOSIS — S30861A Insect bite (nonvenomous) of abdominal wall, initial encounter: Secondary | ICD-10-CM | POA: Insufficient documentation

## 2015-09-16 DIAGNOSIS — S30860A Insect bite (nonvenomous) of lower back and pelvis, initial encounter: Secondary | ICD-10-CM | POA: Diagnosis not present

## 2015-09-16 DIAGNOSIS — E119 Type 2 diabetes mellitus without complications: Secondary | ICD-10-CM | POA: Insufficient documentation

## 2015-09-16 DIAGNOSIS — Z792 Long term (current) use of antibiotics: Secondary | ICD-10-CM | POA: Insufficient documentation

## 2015-09-16 DIAGNOSIS — F419 Anxiety disorder, unspecified: Secondary | ICD-10-CM | POA: Insufficient documentation

## 2015-09-16 DIAGNOSIS — S40861A Insect bite (nonvenomous) of right upper arm, initial encounter: Secondary | ICD-10-CM | POA: Diagnosis not present

## 2015-09-16 DIAGNOSIS — W57XXXA Bitten or stung by nonvenomous insect and other nonvenomous arthropods, initial encounter: Secondary | ICD-10-CM | POA: Insufficient documentation

## 2015-09-16 DIAGNOSIS — Y998 Other external cause status: Secondary | ICD-10-CM | POA: Insufficient documentation

## 2015-09-16 DIAGNOSIS — E669 Obesity, unspecified: Secondary | ICD-10-CM | POA: Diagnosis not present

## 2015-09-16 MED ORDER — HYDROCORTISONE 1 % EX CREA: TOPICAL_CREAM | CUTANEOUS | Status: DC

## 2015-09-16 MED ORDER — DIPHENHYDRAMINE HCL 25 MG PO TABS: 25.0000 mg | ORAL_TABLET | Freq: Four times a day (QID) | ORAL | Status: DC

## 2015-09-16 NOTE — ED Provider Notes (Signed)
CSN: [id number]     Arrival date & time 09/16/15  0946 History   First MD Initiated Contact with Patient 09/16/15 [phone]     Chief Complaint  Patient presents with  . Insect Bite   HPI  [name] is a 25 year old female presenting with insect bite. Patient reports she was sleeping on a friend's house 3 nights ago. She reportedly saw multiple black bugs in the bed and bedsheets. She reports bites over her trunk and extremities. She states these are moderately pruritic she has been scratching at them. Denies drainage from the bug bites. She has not tried any over-the-counter medications or creams. Denies systemic symptoms including fever, chills, nausea or vomiting. She states that she washed the clothes she was wearing at her friend's house and has not worn them since. She has slept in her own bed since staying with her friend. She denies seeing bugs in her bedding at home. She does not believe she has received new bug bites since sleeping at her friend's house. She has no other complaints today.  Past Medical History  Diagnosis Date  . Anxiety   . Mental disorder   . Depression   . Cognitive deficits   . Bipolar 1 disorder ([place])   . Diabetes mellitus without complication ([place])   . Obesity    Past Surgical History  Procedure Laterality Date  . Cesarean section    . Tonsillectomy N/A 06/03/2012    Procedure: TONSILLECTOMY;  Surgeon: [name], MD;  Location: [place];  Service: ENT;  Laterality: N/A;  . Mass excision N/A 06/03/2012    Procedure: EXCISION MASS;  Surgeon: [name], MD;  Location: [place];  Service: ENT;  Laterality: N/A;  Excision uvula mass  . Cesarean section N/A 04/25/2013    Procedure: REPEAT CESAREAN SECTION;  Surgeon: [name], MD;  Location: [facility];  Service: Obstetrics;  Laterality: N/A;  . Tonsillectomy     Family History  Problem Relation Age of Onset  . Hypertension Mother   . Diabetes Father    Social History   Substance Use Topics  . Smoking status: Former Smoker -- 0.50 packs/day for 7 years    Types: Cigarettes    Quit date: 06/30/2015  . Smokeless tobacco: Never Used  . Alcohol Use: No   OB History    Gravida Para Term Preterm AB TAB SAB Ectopic Multiple Living   3 3 3  0 0 0 0 0 0 3     Review of Systems  All other systems reviewed and are negative.     Allergies  Omnipaque and Penicillins  Home Medications   Prior to Admission medications   Medication Sig Start Date End Date Taking? Authorizing Provider  amoxicillin (AMOXIL) 500 MG capsule Take 500 mg by mouth 2 (two) times daily.    Historical Provider, MD  benztropine (COGENTIN) 0.5 MG tablet Take 1 tablet (0.5 mg total) by mouth at bedtime. Patient not taking: Reported on 07/31/2015 07/23/15   Kerrie Buffalo, NP  busPIRone (BUSPAR) 15 MG tablet Take 1 tablet (15 mg total) by mouth 3 (three) times daily. 07/23/15   Kerrie Buffalo, NP  cetirizine (ZYRTEC) 10 MG tablet Take 1 tablet (10 mg total) by mouth daily. For allergies 07/23/15   Kerrie Buffalo, NP  clarithromycin (BIAXIN) 500 MG tablet Take 500 mg by mouth 2 (two) times daily.    Historical Provider, MD  diphenhydrAMINE (BENADRYL) 25 MG tablet Take 1 tablet (25 mg total) by  mouth every 6 (six) hours. 09/16/15   Letha Mirabal, PA-C  fluvoxaMINE (LUVOX) 25 MG tablet Take 3 tablets (75 mg total) by mouth daily with breakfast. Patient taking differently: Take 25 mg by mouth daily with breakfast.  07/23/15   Kerrie Buffalo, NP  fluvoxaMINE (LUVOX) 50 MG tablet Take 1 tablet (50 mg total) by mouth at bedtime. 07/23/15   Kerrie Buffalo, NP  glipiZIDE (GLUCOTROL XL) 5 MG 24 hr tablet Take 1 tablet (5 mg total) by mouth daily with breakfast. 07/23/15   Kerrie Buffalo, NP  haloperidol (HALDOL) 0.5 MG tablet Take 15 tablets (7.5 mg total) by mouth at bedtime. Patient taking differently: Take 0.5 mg by mouth at bedtime.  07/23/15   Kerrie Buffalo, NP  hydrocortisone cream 1 % Apply to  affected area 2 times daily 09/16/15   Hiro Vipond, PA-C  lansoprazole (PREVACID) 30 MG capsule Take 30 mg by mouth 2 (two) times daily before a meal.    Historical Provider, MD  metFORMIN (GLUCOPHAGE) 500 MG tablet Take 1 tablet (500 mg total) by mouth 2 (two) times daily. For diabetes management 07/23/15   Kerrie Buffalo, NP  nicotine polacrilex (NICORETTE) 2 MG gum Take 1 each (2 mg total) by mouth as needed for smoking cessation. 07/23/15   Kerrie Buffalo, NP  simvastatin (ZOCOR) 20 MG tablet Take 1 tablet (20 mg total) by mouth daily at 6 PM. 07/23/15   Kerrie Buffalo, NP  traZODone (DESYREL) 50 MG tablet Take 1 tablet (50 mg total) by mouth at bedtime and may repeat dose one time if needed. Patient not taking: Reported on 07/31/2015 07/23/15   Kerrie Buffalo, NP   BP 140/87 mmHg  Pulse 101  Temp(Src) 98.9 F (37.2 C) (Oral)  Resp 20  Ht 5\' 7"  (1.702 m)  Wt 125.646 kg  BMI 43.37 kg/m2  SpO2 99% Physical Exam  Constitutional: She appears well-developed and well-nourished. No distress.  Nontoxic-appearing  HENT:  Head: Normocephalic and atraumatic.  Right Ear: External ear normal.  Left Ear: External ear normal.  Eyes: Conjunctivae are normal. Right eye exhibits no discharge. Left eye exhibits no discharge. No scleral icterus.  Neck: Normal range of motion.  Cardiovascular: Normal rate.   Pulmonary/Chest: Effort normal.  Musculoskeletal: Normal range of motion.  Moves all extremities spontaneously  Neurological: She is alert. Coordination normal.  Skin: Skin is warm and dry.  Few scattered erythematous papules noted to the abdomen, back and bilateral upper extremities. Bites are approximately 5 mm in diameter. No overlying excoriations or signs of superficial infection. No drainage, induration, fluctuance, warmth or streaking noted. No pustules. No targetoid lesions. No burrowing lesions.  Psychiatric: She has a normal mood and affect. Her behavior is normal.  Nursing note and vitals  reviewed.   ED Course  Procedures (including critical care time) Labs Review Labs Reviewed - No data to display  Imaging Review No results found. I have personally reviewed and evaluated these images and lab results as part of my medical decision-making.   EKG Interpretation None      MDM   Final diagnoses:  Bed bug bite   25 year old female presenting with suspected bug bites after staying at a friend's house. Complains of pruritus. Afebrile and hemodynamically stable. Patient is nontoxic-appearing. Scattered erythematous papules noted to trunk and extremities. No signs of superficial infection. Discussed good hygiene and thoroughly washing bedsheets and clothes. Advised that patient may need professional pest management for full eradication. Will discharge with Benadryl and hydrocortisone  for symptom control. I have also discussed reasons to return to the emergency department. Patient is stable for discharge.    Lahoma Crocker Dequan Kindred, PA-C 09/16/15 1039  Milton Ferguson, MD 09/16/15 [phone]

## 2015-09-16 NOTE — ED Notes (Signed)
Pt reports being covered with bed bug bites. Pt reports seeing the bugs that look like bed bugs.

## 2015-09-16 NOTE — ED Notes (Signed)
Declined W/C at D/C and was escorted to lobby by RN. 

## 2015-09-16 NOTE — Discharge Instructions (Signed)
What are bedbugs? -- Bedbugs are small bugs that do not fly (picture 1). They are found all over the world. Bedbugs can live in hotels, houses, and other places where people rest and sleep. They can live in your mattress, your clothes, the walls, and other parts of your house. Most often, they bite you while you are sleeping or resting.  If you have bedbugs in your home, you might not be able to see them. They are very small and hide during the day.  Bedbugs do not spread diseases in people.  What do bedbug bites look like? -- Bedbug bites are small, red and swollen areas of the skin that are often:  ?In a row or a line ?On parts of the body not usually covered by clothes, such as the face, neck, arms, and hands ?Very itchy Most people don't feel it when they get bitten. They might notice the bites in the morning or after a day or 2. Bedbug bites can take 3 to 6 weeks to heal. They can get infected if you scratch them a lot.  Is there a test to tell if I have bedbug bites? -- No. There is no test. But your doctor or nurse might suspect that your bites are from bedbugs when he or she looks at your skin. Other types of bugs and some diseases can also cause red bumps on the skin that look like bedbug bites.  The only way to know for sure if you have bedbugs in your home is to catch one. Experts can then inspect the insect. They can tell if it is a bedbug or another type of bug.  Is there anything I can do on my own to help my bites feel better? -- Yes. To help your bites feel better and heal faster, you can:  ?Keep the skin clean and dry ?Try not to scratch the bites ?Use an anti-itch lotion or cream to help with the itching Should I see a doctor or nurse? -- See your doctor or nurse right away if the bites:  ?Get redder ?Get more swollen ?Start having pus come out of them If any of these things happen, your bites might be infected. Your doctor or nurse might prescribe medicine for the  infection.  When you first find out you have bedbugs in your home, you might feel very worried or upset. If you feel this way, talk to your doctor about what you can do.  What should I do about the bedbugs in my home? -- You will need to get rid of the bedbugs in your home so you do not get any more bites. To start, you can:  ?Vacuum your home ?Wash your clothes and bedding, and dry them in a dryer that is on the hottest setting ?Clean up your home Then, you or your landlord should call a pest control service. The workers might use a chemical in your home to get rid of the bedbugs. You should not try to use any chemicals yourself. Another way that pest control services can get rid of bedbugs is a special type of heat treatment. This raises the temperature in your home, which kills the bedbugs.  If you want to get rid of some of your clothes or things, do not give them to other people. The bedbugs could still be in them. You do not want to spread bedbugs to other people.  Can bedbug bites be prevented? -- The only way to prevent bites is  by getting rid of the bedbugs. Also, do not sleep or stay over in a place that you know has bedbugs.  Insect Bite Mosquitoes, flies, fleas, bedbugs, and many other insects can bite. Insect bites are different from insect stings. A sting is when poison (venom) is injected into the skin. Insect bites can cause pain or itching for a few days, but they are usually not serious. Some insects can spread diseases to people through a bite. SYMPTOMS  Symptoms of an insect bite include:  Itching or pain in the bite area.  Redness and swelling in the bite area.  An open wound (skin ulcer). In many cases, symptoms last for 2-4 days.  DIAGNOSIS  This condition is usually diagnosed based on symptoms and a physical exam. TREATMENT  Treatment is usually not needed for an insect bite. Symptoms often go away on their own. Your health care provider may recommend creams or  lotions to help reduce itching. Antibiotic medicines may be prescribed if the bite becomes infected. A tetanus shot may be given in some cases. If you develop an allergic reaction to an insect bite, your health care provider will prescribe medicines to treat the reaction (antihistamines). This is rare. HOME CARE INSTRUCTIONS  Do not scratch the bite area.  Keep the bite area clean and dry. Wash the bite area daily with soap and water as told by your health care provider.  If directed, applyice to the bite area.  Put ice in a plastic bag.  Place a towel between your skin and the bag.  Leave the ice on for 20 minutes, 2-3 times per day.  To help reduce itching and swelling, try applying a baking soda paste, cortisone cream, or calamine lotion to the bite area as told by your health care provider.  Apply or take over-the-counter and prescription medicines only as told by your health care provider.  If you were prescribed an antibiotic medicine, use it as told by your health care provider. Do not stop using the antibiotic even if your condition improves.  Keep all follow-up visits as told by your health care provider. This is important. PREVENTION   Use insect repellent. The best insect repellents contain:  DEET, picaridin, oil of lemon eucalyptus (OLE), or IR3535.  Higher amounts of an active ingredient.  When you are outdoors, wear clothing that covers your arms and legs.  Avoid opening windows that do not have window screens. SEEK MEDICAL CARE IF:  You have increased redness, swelling, or pain in the bite area.  You have a fever. SEEK IMMEDIATE MEDICAL CARE IF:   You have joint pain.   You have fluid, blood, or pus coming from the bite area.  You have a headache or neck pain.  You have unusual weakness.  You have a rash.  You have chest pain or shortness of breath.  You have abdominal pain, nausea, or vomiting.  You feel unusually tired or sleepy.   This  information is not intended to replace advice given to you by your health care provider. Make sure you discuss any questions you have with your health care provider.   Document Released: 05/21/2004 Document Revised: 01/02/2015 Document Reviewed: 08/29/2014 Elsevier Interactive Patient Education [employer].

## 2015-10-02 ENCOUNTER — Encounter (HOSPITAL_COMMUNITY): Payer: Self-pay | Admitting: Emergency Medicine

## 2015-10-02 ENCOUNTER — Emergency Department (HOSPITAL_COMMUNITY)
Admission: EM | Admit: 2015-10-02 | Discharge: 2015-10-02 | Discharge status: Against Medical Advice | Payer: Medicaid Other | Attending: Emergency Medicine | Admitting: Emergency Medicine

## 2015-10-02 DIAGNOSIS — R531 Weakness: Secondary | ICD-10-CM

## 2015-10-02 DIAGNOSIS — Z7984 Long term (current) use of oral hypoglycemic drugs: Secondary | ICD-10-CM | POA: Diagnosis not present

## 2015-10-02 DIAGNOSIS — Z79899 Other long term (current) drug therapy: Secondary | ICD-10-CM | POA: Insufficient documentation

## 2015-10-02 DIAGNOSIS — F17210 Nicotine dependence, cigarettes, uncomplicated: Secondary | ICD-10-CM | POA: Insufficient documentation

## 2015-10-02 DIAGNOSIS — E119 Type 2 diabetes mellitus without complications: Secondary | ICD-10-CM | POA: Insufficient documentation

## 2015-10-02 DIAGNOSIS — R5383 Other fatigue: Secondary | ICD-10-CM | POA: Diagnosis present

## 2015-10-02 DIAGNOSIS — F319 Bipolar disorder, unspecified: Secondary | ICD-10-CM | POA: Diagnosis not present

## 2015-10-02 LAB — CBC
HEMATOCRIT: 37.9 % (ref 36.0–46.0)
HEMOGLOBIN: 12.7 g/dL (ref 12.0–15.0)
MCH: 26.2 pg (ref 26.0–34.0)
MCHC: 33.5 g/dL (ref 30.0–36.0)
MCV: 78.1 fL (ref 78.0–100.0)
Platelets: 181 10*3/uL (ref 150–400)
RBC: 4.85 MIL/uL (ref 3.87–5.11)
RDW: 14.3 % (ref 11.5–15.5)
WBC: 6.5 10*3/uL (ref 4.0–10.5)

## 2015-10-02 LAB — URINALYSIS, ROUTINE W REFLEX MICROSCOPIC
BILIRUBIN URINE: NEGATIVE
Glucose, UA: NEGATIVE mg/dL
Hgb urine dipstick: NEGATIVE
Ketones, ur: NEGATIVE mg/dL
Leukocytes, UA: NEGATIVE
NITRITE: NEGATIVE
PH: 6 (ref 5.0–8.0)
Protein, ur: NEGATIVE mg/dL
SPECIFIC GRAVITY, URINE: 1.029 (ref 1.005–1.030)

## 2015-10-02 LAB — BASIC METABOLIC PANEL
ANION GAP: 6 (ref 5–15)
BUN: 13 mg/dL (ref 6–20)
CALCIUM: 8.8 mg/dL — AB (ref 8.9–10.3)
CHLORIDE: 108 mmol/L (ref 101–111)
CO2: 26 mmol/L (ref 22–32)
CREATININE: 0.78 mg/dL (ref 0.44–1.00)
GFR calc non Af Amer: >60 mL/min (ref >60)
GLUCOSE: 191 mg/dL — AB (ref 65–99)
Potassium: 3.8 mmol/L (ref 3.5–5.1)
Sodium: 140 mmol/L (ref 135–145)

## 2015-10-02 LAB — PREGNANCY, URINE: Preg Test, Ur: NEGATIVE

## 2015-10-02 MED ORDER — SODIUM CHLORIDE 0.9 % IV BOLUS (SEPSIS)
1000.0000 mL | Freq: Once | INTRAVENOUS | Status: AC
  Administered 2015-10-02: 1000 mL via INTRAVENOUS

## 2015-10-02 NOTE — ED Notes (Addendum)
Per EMS, patient complaining of being tired. Stating "I am tired and my legs are weak" Patient states this happens about once a month. Pt has been seen for this prior and MD told patient to take vitamin D, however, patient states she is "still tired" Hx of diabetes - type II

## 2015-10-02 NOTE — ED Notes (Signed)
Patient went to restroom but did not collect urine sample.

## 2015-10-02 NOTE — ED Notes (Signed)
Bed: [id number] Expected date:  Expected time:  Means of arrival:  Comments: EMS 27 F - weakness/dizziness -AOA

## 2015-10-02 NOTE — ED Provider Notes (Signed)
CSN: [id number]     Arrival date & time 10/02/15  1225 History   First MD Initiated Contact with Patient 10/02/15 1234     Chief Complaint  Patient presents with  . Fatigue   HPI   [name] is a 25 y.o. female PMH significant for anxiety, depression, bipolar I disorder, DM presenting with a 2 day history of weakness and lethargy. She states she gets this about once a month. She states she was told by her primary care provider that she has low vitamin D. She states that she has been taking vitamin D supplements. She denies fevers, chills, cough, abdominal pain, nausea, vomiting, change in bowel or bladder habits.  Past Medical History  Diagnosis Date  . Anxiety   . Mental disorder   . Depression   . Cognitive deficits   . Bipolar 1 disorder ([place])   . Diabetes mellitus without complication ([place])   . Obesity    Past Surgical History  Procedure Laterality Date  . Cesarean section    . Tonsillectomy N/A 06/03/2012    Procedure: TONSILLECTOMY;  Surgeon: [name], MD;  Location: [facility];  Service: ENT;  Laterality: N/A;  . Mass excision N/A 06/03/2012    Procedure: EXCISION MASS;  Surgeon: [name], MD;  Location: [place] [facility];  Service: ENT;  Laterality: N/A;  Excision uvula mass  . Cesarean section N/A 04/25/2013    Procedure: REPEAT CESAREAN SECTION;  Surgeon: [name], MD;  Location: [facility];  Service: Obstetrics;  Laterality: N/A;  . Tonsillectomy     Family History  Problem Relation Age of Onset  . Hypertension Mother   . Diabetes Father    Social History  Substance Use Topics  . Smoking status: Current Every Day Smoker -- 1.00 packs/day for 7 years    Types: Cigarettes    Last Attempt to Quit: 06/30/2015  . Smokeless tobacco: Never Used  . Alcohol Use: No   OB History    Gravida Para Term Preterm AB TAB SAB Ectopic Multiple Living   3 3 3  0 0 0 0 0 0 3     Review of Systems  Ten systems are reviewed and are negative  for acute change except as noted in the HPI  Allergies  Omnipaque and Penicillins  Home Medications   Prior to Admission medications   Medication Sig Start Date End Date Taking? Authorizing Provider  amoxicillin (AMOXIL) 500 MG capsule Take 500 mg by mouth 2 (two) times daily.    Historical Provider, MD  benztropine (COGENTIN) 0.5 MG tablet Take 1 tablet (0.5 mg total) by mouth at bedtime. Patient not taking: Reported on 07/31/2015 07/23/15   Kerrie Buffalo, NP  busPIRone (BUSPAR) 15 MG tablet Take 1 tablet (15 mg total) by mouth 3 (three) times daily. 07/23/15   Kerrie Buffalo, NP  cetirizine (ZYRTEC) 10 MG tablet Take 1 tablet (10 mg total) by mouth daily. For allergies 07/23/15   Kerrie Buffalo, NP  clarithromycin (BIAXIN) 500 MG tablet Take 500 mg by mouth 2 (two) times daily.    Historical Provider, MD  diphenhydrAMINE (BENADRYL) 25 MG tablet Take 1 tablet (25 mg total) by mouth every 6 (six) hours. 09/16/15   Stevi Barrett, PA-C  fluvoxaMINE (LUVOX) 25 MG tablet Take 3 tablets (75 mg total) by mouth daily with breakfast. Patient taking differently: Take 25 mg by mouth daily with breakfast.  07/23/15   Kerrie Buffalo, NP  fluvoxaMINE (LUVOX) 50 MG tablet Take  1 tablet (50 mg total) by mouth at bedtime. 07/23/15   Kerrie Buffalo, NP  glipiZIDE (GLUCOTROL XL) 5 MG 24 hr tablet Take 1 tablet (5 mg total) by mouth daily with breakfast. 07/23/15   Kerrie Buffalo, NP  haloperidol (HALDOL) 0.5 MG tablet Take 15 tablets (7.5 mg total) by mouth at bedtime. Patient taking differently: Take 0.5 mg by mouth at bedtime.  07/23/15   Kerrie Buffalo, NP  hydrocortisone cream 1 % Apply to affected area 2 times daily 09/16/15   Stevi Barrett, PA-C  lansoprazole (PREVACID) 30 MG capsule Take 30 mg by mouth 2 (two) times daily before a meal.    Historical Provider, MD  metFORMIN (GLUCOPHAGE) 500 MG tablet Take 1 tablet (500 mg total) by mouth 2 (two) times daily. For diabetes management 07/23/15   Kerrie Buffalo, NP   nicotine polacrilex (NICORETTE) 2 MG gum Take 1 each (2 mg total) by mouth as needed for smoking cessation. 07/23/15   Kerrie Buffalo, NP  simvastatin (ZOCOR) 20 MG tablet Take 1 tablet (20 mg total) by mouth daily at 6 PM. 07/23/15   Kerrie Buffalo, NP  traZODone (DESYREL) 50 MG tablet Take 1 tablet (50 mg total) by mouth at bedtime and may repeat dose one time if needed. Patient not taking: Reported on 07/31/2015 07/23/15   Kerrie Buffalo, NP   BP 134/88 mmHg  Pulse 102  Temp(Src) 98.2 F (36.8 C) (Oral)  Resp 18  Ht 5\' 7"  (1.702 m)  Wt 125.646 kg  BMI 43.37 kg/m2  SpO2 97% Physical Exam  Constitutional: She is oriented to person, place, and time. She appears well-developed and well-nourished. No distress.  Morbidly obese  HENT:  Head: Normocephalic and atraumatic.  Mouth/Throat: Oropharynx is clear and moist. No oropharyngeal exudate.  Eyes: Conjunctivae and EOM are normal. Pupils are equal, round, and reactive to light. Right eye exhibits no discharge. Left eye exhibits no discharge. No scleral icterus.  Neck: Normal range of motion. No tracheal deviation present.  Cardiovascular: Normal rate, regular rhythm, normal heart sounds and intact distal pulses.  Exam reveals no gallop and no friction rub.   No murmur heard. Pulmonary/Chest: Effort normal and breath sounds normal. No respiratory distress. She has no wheezes. She has no rales. She exhibits no tenderness.  Abdominal: Soft. Bowel sounds are normal. She exhibits no distension and no mass. There is no tenderness. There is no rebound and no guarding.  Musculoskeletal: Normal range of motion. She exhibits no edema or tenderness.  Lymphadenopathy:    She has no cervical adenopathy.  Neurological: She is alert and oriented to person, place, and time. Coordination normal.  Cranial nerves II through XII grossly intact. Normal finger to nose, rapid alternate movements, pronator drift.  Skin: Skin is warm and dry. No rash noted. She is  not diaphoretic. No erythema.  Psychiatric: She has a normal mood and affect. Her behavior is normal.  Nursing note and vitals reviewed.   ED Course  Procedures  Labs Review Labs Reviewed  BASIC METABOLIC PANEL - Abnormal; Notable for the following:    Glucose, Bld 191 (*)    Calcium 8.8 (*)    All other components within normal limits  CBC  PREGNANCY, URINE  URINALYSIS, ROUTINE W REFLEX MICROSCOPIC (NOT AT [facility])   MDM   Final diagnoses:  None   Patient nontoxic appearing, VSS. Will obtain basic labs for anemia, electrolyte disturbances evaluation. Doubt infectious etiology- patient afebrile, no cough/URI symptoms. Patient has diabetes- will check BMP  and UA.  Patient was seen February 2017 for similar complaints with unremarkable workup. BMP with hyperglycemia of 191. CBC unremarkable.   2:28 PM RN informed me that patient has left the room. She left her urine sample at bedside. Patient left AMA prior to further evaluation or discharge.  Paragonah Lions, PA-C 10/02/15 [place], MD 10/02/15 1444

## 2015-10-02 NOTE — ED Notes (Addendum)
Patient left AMA after giving urine sample. This RN checked patient's room and the bathroom. Patient not seen. Patient did not make any staff aware that she wanted to leave. PA made aware.

## 2015-10-06 ENCOUNTER — Emergency Department (HOSPITAL_COMMUNITY)
Admission: EM | Admit: 2015-10-06 | Discharge: 2015-10-06 | Disposition: A | Discharge status: Home / Self Care | Payer: Medicaid Other | Attending: Emergency Medicine | Admitting: Emergency Medicine

## 2015-10-06 ENCOUNTER — Encounter (HOSPITAL_COMMUNITY): Payer: Self-pay | Admitting: Emergency Medicine

## 2015-10-06 DIAGNOSIS — E119 Type 2 diabetes mellitus without complications: Secondary | ICD-10-CM | POA: Diagnosis not present

## 2015-10-06 DIAGNOSIS — F17210 Nicotine dependence, cigarettes, uncomplicated: Secondary | ICD-10-CM | POA: Diagnosis not present

## 2015-10-06 DIAGNOSIS — R1032 Left lower quadrant pain: Secondary | ICD-10-CM | POA: Diagnosis present

## 2015-10-06 DIAGNOSIS — R531 Weakness: Secondary | ICD-10-CM

## 2015-10-06 DIAGNOSIS — N200 Calculus of kidney: Secondary | ICD-10-CM | POA: Diagnosis not present

## 2015-10-06 DIAGNOSIS — Z7984 Long term (current) use of oral hypoglycemic drugs: Secondary | ICD-10-CM | POA: Insufficient documentation

## 2015-10-06 DIAGNOSIS — F319 Bipolar disorder, unspecified: Secondary | ICD-10-CM | POA: Insufficient documentation

## 2015-10-06 LAB — BASIC METABOLIC PANEL
Anion gap: 10 (ref 5–15)
BUN: 13 mg/dL (ref 6–20)
CHLORIDE: 102 mmol/L (ref 101–111)
CO2: 26 mmol/L (ref 22–32)
CREATININE: 0.79 mg/dL (ref 0.44–1.00)
Calcium: 9.5 mg/dL (ref 8.9–10.3)
GFR calc Af Amer: >60 mL/min (ref >60)
GFR calc non Af Amer: >60 mL/min (ref >60)
Glucose, Bld: 182 mg/dL — ABNORMAL HIGH (ref 65–99)
Potassium: 3.9 mmol/L (ref 3.5–5.1)
SODIUM: 138 mmol/L (ref 135–145)

## 2015-10-06 LAB — CBC
HCT: 38.7 % (ref 36.0–46.0)
Hemoglobin: 12.2 g/dL (ref 12.0–15.0)
MCH: 25.3 pg — AB (ref 26.0–34.0)
MCHC: 31.5 g/dL (ref 30.0–36.0)
MCV: 80.3 fL (ref 78.0–100.0)
PLATELETS: 195 10*3/uL (ref 150–400)
RBC: 4.82 MIL/uL (ref 3.87–5.11)
RDW: 14.5 % (ref 11.5–15.5)
WBC: 8.4 10*3/uL (ref 4.0–10.5)

## 2015-10-06 LAB — URINALYSIS, ROUTINE W REFLEX MICROSCOPIC
BILIRUBIN URINE: NEGATIVE
GLUCOSE, UA: NEGATIVE mg/dL
HGB URINE DIPSTICK: NEGATIVE
Ketones, ur: 15 mg/dL — AB
Leukocytes, UA: NEGATIVE
Nitrite: NEGATIVE
Protein, ur: 100 mg/dL — AB
SPECIFIC GRAVITY, URINE: 1.036 — AB (ref 1.005–1.030)
pH: 5.5 (ref 5.0–8.0)

## 2015-10-06 LAB — URINE MICROSCOPIC-ADD ON: WBC, UA: NONE SEEN WBC/hpf (ref 0–5)

## 2015-10-06 LAB — PREGNANCY, URINE: PREG TEST UR: NEGATIVE

## 2015-10-06 NOTE — ED Notes (Signed)
PA at bedside.

## 2015-10-06 NOTE — Discharge Instructions (Signed)
Please read and follow all provided instructions.  Your diagnoses today include:  1. Weakness   2. Nephrolithiasis    Tests performed today include:  Urine test that showed blood in your urine and no infection  Blood test that showed normal kidney function  Vital signs. See below for your results today.   Medications prescribed:   Take any prescribed medications only as directed.  Home care instructions:  Follow any educational materials contained in this packet.  Please double your fluid intake for the next several days. Strain your urine and save any stones that may pass.   BE VERY CAREFUL not to take multiple medicines containing Tylenol (also called acetaminophen). Doing so can lead to an overdose which can damage your liver and cause liver failure and possibly death.   Follow-up instructions: Please follow-up with your primary care provider in the next 1 week for further evaluation of your symptoms.  If you need to return to the Emergency Department, go to [facility] and not [facility]. The urologists are located at [facility] and can better care for you at this location.  Return instructions:  If you need to return to the Emergency Department, go to [facility] and not [facility]. The urologists are located at [facility] and can better care for you at this location.   Please return to the Emergency Department if you experience worsening symptoms.  Please return if you develop fever or uncontrolled pain or vomiting.  Please return if you have any other emergent concerns.  Additional Information:  Your vital signs today were: BP 117/72 mmHg   Pulse 97   Temp(Src) 98.2 F (36.8 C) (Oral)   Resp 18   Ht 5\' 7"  (1.702 m)   Wt 126.1 kg   BMI 43.53 kg/m2   SpO2 100% If your blood pressure (BP) was elevated above 135/85 this visit, please have this repeated by your doctor within one month. --------------

## 2015-10-06 NOTE — ED Provider Notes (Signed)
CSN: [id number]     Arrival date & time 10/06/15  1754 History   First MD Initiated Contact with Patient 10/06/15 1756     Chief Complaint  Patient presents with  . Leg Pain  . Abdominal Pain   (Consider location/radiation/quality/duration/timing/severity/associated sxs/prior Treatment) HPI 25 y.o. female with a hx of Anxiety, Depression, Bipolar I, DM, Obesity, presents to the Emergency Department today with multiple complaints.  1) Pt presents with bilateral leg pain with associated weakness x 2.5 months. Pt states that she feels the leg pain on both side when ambulating around upper thighs. Notes pain as aching and intermittent. Has been seen in the past for similar. No numbness/tingling. No headaches. Pt able to ambulate.  2) Pt also presents with abdominal pain x 6 months. Notes pain is around lower abdomen. Intermittent. No aggravating factors. Notes pain in LLQ and suprapubic. States pain is 7/10. Throbbing. BM today. No N/V/D. No CP/SOB. No fevers. Able to tolerate PO. No vaginal discharge. No vaginal bleeding. No dysuria. No hematuria. Pt is on Implanon. LMP x 2 years ago.    Past Medical History  Diagnosis Date  . Anxiety   . Mental disorder   . Depression   . Cognitive deficits   . Bipolar 1 disorder ([place])   . Diabetes mellitus without complication ([place])   . Obesity    Past Surgical History  Procedure Laterality Date  . Cesarean section    . Tonsillectomy N/A 06/03/2012    Procedure: TONSILLECTOMY;  Surgeon: [name], MD;  Location: [facility];  Service: ENT;  Laterality: N/A;  . Mass excision N/A 06/03/2012    Procedure: EXCISION MASS;  Surgeon: [name], MD;  Location: [facility];  Service: ENT;  Laterality: N/A;  Excision uvula mass  . Cesarean section N/A 04/25/2013    Procedure: REPEAT CESAREAN SECTION;  Surgeon: [name], MD;  Location: [facility];  Service: Obstetrics;  Laterality: N/A;  . Tonsillectomy     Family History   Problem Relation Age of Onset  . Hypertension Mother   . Diabetes Father    Social History  Substance Use Topics  . Smoking status: Current Every Day Smoker -- 1.00 packs/day for 7 years    Types: Cigarettes    Last Attempt to Quit: 06/30/2015  . Smokeless tobacco: Never Used  . Alcohol Use: No   OB History    Gravida Para Term Preterm AB TAB SAB Ectopic Multiple Living   3 3 3  0 0 0 0 0 0 3     Review of Systems ROS reviewed and all are negative for acute change except as noted in the HPI.  Allergies  Omnipaque and Penicillins  Home Medications   Prior to Admission medications   Medication Sig Start Date End Date Taking? Authorizing Provider  benztropine (COGENTIN) 0.5 MG tablet Take 1 tablet (0.5 mg total) by mouth at bedtime. Patient not taking: Reported on 07/31/2015 07/23/15   Kerrie Buffalo, NP  benztropine (COGENTIN) 1 MG tablet Take 1 mg by mouth daily. 09/25/15   Historical Provider, MD  busPIRone (BUSPAR) 15 MG tablet Take 1 tablet (15 mg total) by mouth 3 (three) times daily. Patient taking differently: Take 15 mg by mouth 2 (two) times daily.  07/23/15   Kerrie Buffalo, NP  cetirizine (ZYRTEC) 10 MG tablet Take 1 tablet (10 mg total) by mouth daily. For allergies 07/23/15   Kerrie Buffalo, NP  Cholecalciferol (VITAMIN D PO) Take 1 capsule by mouth  daily.    Historical Provider, MD  diphenhydrAMINE (BENADRYL) 25 MG tablet Take 1 tablet (25 mg total) by mouth every 6 (six) hours. Patient not taking: Reported on 10/02/2015 09/16/15   Lahoma Crocker Barrett, PA-C  DULoxetine (CYMBALTA) 60 MG capsule Take 60 mg by mouth daily. 09/25/15   Historical Provider, MD  fluvoxaMINE (LUVOX) 100 MG tablet Take 100 mg by mouth 2 (two) times daily. 09/25/15   Historical Provider, MD  fluvoxaMINE (LUVOX) 25 MG tablet Take 3 tablets (75 mg total) by mouth daily with breakfast. Patient not taking: Reported on 10/02/2015 07/23/15   Kerrie Buffalo, NP  fluvoxaMINE (LUVOX) 50 MG tablet Take 1 tablet (50 mg  total) by mouth at bedtime. Patient not taking: Reported on 10/02/2015 07/23/15   Kerrie Buffalo, NP  glipiZIDE (GLUCOTROL XL) 5 MG 24 hr tablet Take 1 tablet (5 mg total) by mouth daily with breakfast. 07/23/15   Kerrie Buffalo, NP  haloperidol (HALDOL) 0.5 MG tablet Take 15 tablets (7.5 mg total) by mouth at bedtime. Patient taking differently: Take 0.5 mg by mouth at bedtime.  07/23/15   Kerrie Buffalo, NP  hydrocortisone cream 1 % Apply to affected area 2 times daily Patient not taking: Reported on 10/02/2015 09/16/15   Lahoma Crocker Barrett, PA-C  metFORMIN (GLUCOPHAGE) 500 MG tablet Take 1 tablet (500 mg total) by mouth 2 (two) times daily. For diabetes management 07/23/15   Kerrie Buffalo, NP  nicotine polacrilex (NICORETTE) 2 MG gum Take 1 each (2 mg total) by mouth as needed for smoking cessation. Patient not taking: Reported on 10/02/2015 07/23/15   Kerrie Buffalo, NP  simvastatin (ZOCOR) 20 MG tablet Take 1 tablet (20 mg total) by mouth daily at 6 PM. 07/23/15   Kerrie Buffalo, NP  traZODone (DESYREL) 50 MG tablet Take 1 tablet (50 mg total) by mouth at bedtime and may repeat dose one time if needed. Patient not taking: Reported on 07/31/2015 07/23/15   Kerrie Buffalo, NP   BP 144/95 mmHg  Pulse 109  Temp(Src) 98.2 F (36.8 C) (Oral)  Resp 16  Ht 5\' 7"  (1.702 m)  Wt 126.1 kg  BMI 43.53 kg/m2  SpO2 100%   Physical Exam  Constitutional: She is oriented to person, place, and time. She appears well-developed and well-nourished.  HENT:  Head: Normocephalic and atraumatic.  Eyes: EOM are normal. Pupils are equal, round, and reactive to light.  Neck: Normal range of motion. Neck supple. No tracheal deviation present.  Cardiovascular: Normal rate, regular rhythm, normal heart sounds and intact distal pulses.   No murmur heard. Pulmonary/Chest: Effort normal and breath sounds normal. No respiratory distress. She has no wheezes. She has no rales. She exhibits no tenderness.  Abdominal: Soft. Normal  appearance and bowel sounds are normal. There is tenderness in the suprapubic area and left lower quadrant. There is no rigidity, no rebound, no guarding, no tenderness at McBurney's point and negative Murphy's sign.  Abdomen soft  Musculoskeletal: Normal range of motion.  Able to raise BLE. Able to ambulate. DTR unremarkable. Motor/sensation intact. Negative Homans. No calf tenderness    Neurological: She is alert and oriented to person, place, and time. She has normal strength and normal reflexes. No cranial nerve deficit or sensory deficit.  Cranial Nerves:  II: Pupils equal, round, reactive to light III,IV, VI: ptosis not present, extra-ocular motions intact bilaterally  V,VII: smile symmetric, facial light touch sensation equal VIII: hearing grossly normal bilaterally  IX,X: midline uvula rise  XI: bilateral shoulder shrug equal  and strong XII: midline tongue extension  Skin: Skin is warm and dry.  Psychiatric: She has a normal mood and affect. Her behavior is normal. Thought content normal.  Nursing note and vitals reviewed.  ED Course  Procedures (including critical care time) Labs Review Labs Reviewed  CBC - Abnormal; Notable for the following:    MCH 25.3 (*)    All other components within normal limits  BASIC METABOLIC PANEL - Abnormal; Notable for the following:    Glucose, Bld 182 (*)    All other components within normal limits  URINALYSIS, ROUTINE W REFLEX MICROSCOPIC (NOT AT [facility]) - Abnormal; Notable for the following:    Specific Gravity, Urine 1.036 (*)    Ketones, ur 15 (*)    Protein, ur 100 (*)    All other components within normal limits  URINE MICROSCOPIC-ADD ON - Abnormal; Notable for the following:    Squamous Epithelial / LPF 0-5 (*)    Bacteria, UA FEW (*)    Crystals CA OXALATE CRYSTALS (*)    All other components within normal limits  URINE CULTURE  PREGNANCY, URINE  POC URINE PREG, ED   Imaging Review No results found. I have personally  reviewed and evaluated these images and lab results as part of my medical decision-making.   EKG Interpretation None      MDM  I have reviewed and evaluated the relevant laboratory values  I have reviewed the relevant previous healthcare records.I obtained HPI from historian.  ED Course:  Assessment: Pt is a 24yF with hx Anxiety, Depression, Bipolar I, DM, Obesity, who presents with leg pain/weakness and abdominal pain for 2+ months. On exam, pt in NAD. Nontoxic/nonseptic appearing. VSS. Afebrile. Lungs CTA. Heart RRR. Abdomen soft. TTP LLQ. CN evaluated and unremarkable. BLE DTR intact. Motor/sensation intact. Able to ambulate. Labs unremarkable. UA showed some dehydration with calcium oxalate stones. No flank pain. No fever. Question underlying psychiatric etiology for leg pain/weakness. Observed patient walk to bathroom to give UA sample without difficulty. Normal gait. Seen [facility] on 10-02-15 for similar and left AMA. Has been seen in the past for ABD pain and fatigue. Plan is to [place] with follow up to PCP. At time of discharge, Patient is in no acute distress. Vital Signs are stable. Patient is able to ambulate. Patient able to tolerate PO.    Disposition/Plan:  DC home Additional Verbal discharge instructions given and discussed with patient.  Pt Instructed to f/u with PCP in the next week for evaluation and treatment of symptoms. Return precautions given Pt acknowledges and agrees with plan  Supervising Physician Gareth Morgan, MD   Final diagnoses:  Nephrolithiasis  Weakness      [name], PA-C 10/06/15 2050  Gareth Morgan, MD 10/07/15 1250

## 2015-10-06 NOTE — ED Notes (Signed)
Reports right leg pain and sudden episodes of leg weakness that make her unable to walk for a month. Currently walking around room without difficulty. Does report pain in right leg. Also reports abdominal pain that has been ongoing for 3 months, states stomach hurts worse when she smokes cigarettes. States a doctor told her that her lungs were "closed" and wants to know if she can have an XRay of her chest, stomach, and legs.

## 2015-10-08 LAB — URINE CULTURE

## 2015-10-15 ENCOUNTER — Emergency Department (HOSPITAL_COMMUNITY)
Admission: EM | Admit: 2015-10-15 | Discharge: 2015-10-16 | Disposition: A | Discharge status: Home / Self Care | Payer: Medicaid Other | Attending: Emergency Medicine | Admitting: Emergency Medicine

## 2015-10-15 ENCOUNTER — Encounter (HOSPITAL_COMMUNITY): Payer: Self-pay | Admitting: Emergency Medicine

## 2015-10-15 DIAGNOSIS — F17210 Nicotine dependence, cigarettes, uncomplicated: Secondary | ICD-10-CM | POA: Insufficient documentation

## 2015-10-15 DIAGNOSIS — F319 Bipolar disorder, unspecified: Secondary | ICD-10-CM | POA: Diagnosis not present

## 2015-10-15 DIAGNOSIS — R1031 Right lower quadrant pain: Secondary | ICD-10-CM | POA: Insufficient documentation

## 2015-10-15 DIAGNOSIS — E65 Localized adiposity: Secondary | ICD-10-CM

## 2015-10-15 DIAGNOSIS — Z7984 Long term (current) use of oral hypoglycemic drugs: Secondary | ICD-10-CM | POA: Insufficient documentation

## 2015-10-15 DIAGNOSIS — E119 Type 2 diabetes mellitus without complications: Secondary | ICD-10-CM | POA: Insufficient documentation

## 2015-10-15 LAB — COMPREHENSIVE METABOLIC PANEL
ALK PHOS: 52 U/L (ref 38–126)
ALT: 43 U/L (ref 14–54)
AST: 41 U/L (ref 15–41)
Albumin: 5 g/dL (ref 3.5–5.0)
Anion gap: 9 (ref 5–15)
BILIRUBIN TOTAL: 0.5 mg/dL (ref 0.3–1.2)
BUN: 15 mg/dL (ref 6–20)
CALCIUM: 9.5 mg/dL (ref 8.9–10.3)
CHLORIDE: 107 mmol/L (ref 101–111)
CO2: 24 mmol/L (ref 22–32)
CREATININE: 0.8 mg/dL (ref 0.44–1.00)
Glucose, Bld: 149 mg/dL — ABNORMAL HIGH (ref 65–99)
Potassium: 3.6 mmol/L (ref 3.5–5.1)
Sodium: 140 mmol/L (ref 135–145)
TOTAL PROTEIN: 8.2 g/dL — AB (ref 6.5–8.1)

## 2015-10-15 LAB — CBC
HCT: 38 % (ref 36.0–46.0)
Hemoglobin: 12.7 g/dL (ref 12.0–15.0)
MCH: 26.3 pg (ref 26.0–34.0)
MCHC: 33.4 g/dL (ref 30.0–36.0)
MCV: 78.7 fL (ref 78.0–100.0)
PLATELETS: 232 10*3/uL (ref 150–400)
RBC: 4.83 MIL/uL (ref 3.87–5.11)
RDW: 14.4 % (ref 11.5–15.5)
WBC: 10.2 10*3/uL (ref 4.0–10.5)

## 2015-10-15 LAB — LIPASE, BLOOD: Lipase: 22 U/L (ref 11–51)

## 2015-10-15 LAB — I-STAT BETA HCG BLOOD, ED (MC, WL, AP ONLY)

## 2015-10-15 NOTE — ED Notes (Signed)
Pt brought in by EMS complains of abdominal pain for the past 6 months.  Was seen at [facility] one week ago and was told she had acute kidney stones.  Pt was given pain medication for it and has not had any relief. Complains of vaginal itching as well. Vitals in field 160/90, HR 96, CBG 172.

## 2015-10-15 NOTE — ED Notes (Signed)
Bed: [facility]3 Expected date:  Expected time:  Means of arrival:  Comments: EMS 24yo F abd pain x several months - triage

## 2015-10-16 ENCOUNTER — Emergency Department (HOSPITAL_COMMUNITY): Payer: Medicaid Other

## 2015-10-16 LAB — URINE MICROSCOPIC-ADD ON
BACTERIA UA: NONE SEEN
RBC / HPF: NONE SEEN RBC/hpf (ref 0–5)
WBC UA: NONE SEEN WBC/hpf (ref 0–5)

## 2015-10-16 LAB — URINALYSIS, ROUTINE W REFLEX MICROSCOPIC
Bilirubin Urine: NEGATIVE
Glucose, UA: NEGATIVE mg/dL
Hgb urine dipstick: NEGATIVE
Ketones, ur: NEGATIVE mg/dL
LEUKOCYTES UA: NEGATIVE
Nitrite: NEGATIVE
PROTEIN: 30 mg/dL — AB
SPECIFIC GRAVITY, URINE: 1.039 — AB (ref 1.005–1.030)
pH: 5 (ref 5.0–8.0)

## 2015-10-16 MED ORDER — IBUPROFEN 400 MG PO TABS: 400.0000 mg | ORAL_TABLET | Freq: Four times a day (QID) | ORAL | Status: DC | PRN

## 2015-10-16 NOTE — ED Provider Notes (Signed)
CSN: [id number]     Arrival date & time 10/15/15  2140 History   First MD Initiated Contact with Patient 10/16/15 0016     Chief Complaint  Patient presents with  . Abdominal Pain     (Consider location/radiation/quality/duration/timing/severity/associated sxs/prior Treatment) Patient is a 25 y.o. female presenting with abdominal pain. The history is provided by the patient.  Abdominal Pain Pain location:  RLQ Pain quality: aching and stabbing   Pain radiates to:  Does not radiate Pain severity:  Mild Onset quality:  Gradual Duration:  24 weeks Timing:  Intermittent Progression:  Unchanged Chronicity:  Chronic Context: not diet changes and not laxative use   Relieved by:  Nothing Worsened by:  Movement Ineffective treatments:  NSAIDs Associated symptoms: no anorexia, no chest pain, no chills, no constipation, no cough, no diarrhea, no dysuria, no fever, no hematuria, no nausea, no shortness of breath, no vaginal bleeding, no vaginal discharge and no vomiting     Past Medical History  Diagnosis Date  . Anxiety   . Mental disorder   . Depression   . Cognitive deficits   . Bipolar 1 disorder ([place])   . Diabetes mellitus without complication ([place])   . Obesity    Past Surgical History  Procedure Laterality Date  . Cesarean section    . Tonsillectomy N/A 06/03/2012    Procedure: TONSILLECTOMY;  Surgeon: [name], MD;  Location: [place] [facility];  Service: ENT;  Laterality: N/A;  . Mass excision N/A 06/03/2012    Procedure: EXCISION MASS;  Surgeon: [name], MD;  Location: [place];  Service: ENT;  Laterality: N/A;  Excision uvula mass  . Cesarean section N/A 04/25/2013    Procedure: REPEAT CESAREAN SECTION;  Surgeon: [name], MD;  Location: [facility];  Service: Obstetrics;  Laterality: N/A;  . Tonsillectomy     Family History  Problem Relation Age of Onset  . Hypertension Mother   . Diabetes Father    Social History  Substance  Use Topics  . Smoking status: Current Every Day Smoker -- 1.00 packs/day for 7 years    Types: Cigarettes    Last Attempt to Quit: 06/30/2015  . Smokeless tobacco: Never Used  . Alcohol Use: No   OB History    Gravida Para Term Preterm AB TAB SAB Ectopic Multiple Living   3 3 3  0 0 0 0 0 0 3     Review of Systems  Constitutional: Negative for fever and chills.  Respiratory: Negative for cough and shortness of breath.   Cardiovascular: Negative for chest pain.  Gastrointestinal: Positive for abdominal pain. Negative for nausea, vomiting, diarrhea, constipation and anorexia.  Genitourinary: Negative for dysuria, hematuria, vaginal bleeding and vaginal discharge.  Skin: Negative for rash.  All other systems reviewed and are negative.     Allergies  Omnipaque and Penicillins  Home Medications   Prior to Admission medications   Medication Sig Start Date End Date Taking? Authorizing Provider  busPIRone (BUSPAR) 15 MG tablet Take 1 tablet (15 mg total) by mouth 3 (three) times daily. Patient taking differently: Take 15 mg by mouth 2 (two) times daily.  07/23/15  Yes Kerrie Buffalo, NP  cetirizine (ZYRTEC) 10 MG tablet Take 1 tablet (10 mg total) by mouth daily. For allergies Patient taking differently: Take 10 mg by mouth daily as needed for allergies.  07/23/15  Yes Kerrie Buffalo, NP  DULoxetine (CYMBALTA) 60 MG capsule Take 60 mg by mouth daily. 09/25/15  Yes  Historical Provider, MD  fluvoxaMINE (LUVOX) 100 MG tablet Take 100 mg by mouth daily.  09/25/15  Yes Historical Provider, MD  glipiZIDE (GLUCOTROL XL) 5 MG 24 hr tablet Take 1 tablet (5 mg total) by mouth daily with breakfast. Patient taking differently: Take 10 mg by mouth daily with breakfast.  07/23/15  Yes Kerrie Buffalo, NP  metFORMIN (GLUCOPHAGE) 500 MG tablet Take 1 tablet (500 mg total) by mouth 2 (two) times daily. For diabetes management 07/23/15  Yes Kerrie Buffalo, NP  benztropine (COGENTIN) 0.5 MG tablet Take 1 tablet  (0.5 mg total) by mouth at bedtime. Patient not taking: Reported on 07/31/2015 07/23/15   Kerrie Buffalo, NP  diphenhydrAMINE (BENADRYL) 25 MG tablet Take 1 tablet (25 mg total) by mouth every 6 (six) hours. Patient not taking: Reported on 10/02/2015 09/16/15   Lahoma Crocker Barrett, PA-C  fluvoxaMINE (LUVOX) 25 MG tablet Take 3 tablets (75 mg total) by mouth daily with breakfast. Patient not taking: Reported on 10/02/2015 07/23/15   Kerrie Buffalo, NP  fluvoxaMINE (LUVOX) 50 MG tablet Take 1 tablet (50 mg total) by mouth at bedtime. Patient not taking: Reported on 10/02/2015 07/23/15   Kerrie Buffalo, NP  haloperidol (HALDOL) 0.5 MG tablet Take 15 tablets (7.5 mg total) by mouth at bedtime. Patient not taking: Reported on 10/15/2015 07/23/15   Kerrie Buffalo, NP  hydrocortisone cream 1 % Apply to affected area 2 times daily Patient not taking: Reported on 10/02/2015 09/16/15   Lahoma Crocker Barrett, PA-C  ibuprofen (ADVIL,MOTRIN) 400 MG tablet Take 1 tablet (400 mg total) by mouth every 6 (six) hours as needed. 10/16/15   Junius Creamer, NP  nicotine polacrilex (NICORETTE) 2 MG gum Take 1 each (2 mg total) by mouth as needed for smoking cessation. Patient not taking: Reported on 10/02/2015 07/23/15   Kerrie Buffalo, NP  simvastatin (ZOCOR) 20 MG tablet Take 1 tablet (20 mg total) by mouth daily at 6 PM. Patient not taking: Reported on 10/06/2015 07/23/15   Kerrie Buffalo, NP  traZODone (DESYREL) 50 MG tablet Take 1 tablet (50 mg total) by mouth at bedtime and may repeat dose one time if needed. Patient not taking: Reported on 07/31/2015 07/23/15   Kerrie Buffalo, NP   BP 145/107 mmHg  Pulse 103  Temp(Src) 99 F (37.2 C) (Oral)  Resp 18  SpO2 96%  LMP  Physical Exam  Constitutional: She appears well-developed and well-nourished.  HENT:  Head: Normocephalic.  Eyes: Pupils are equal, round, and reactive to light.  Neck: Normal range of motion.  Cardiovascular: Normal rate and regular rhythm.   Pulmonary/Chest: Effort normal and  breath sounds normal.  Abdominal: Soft. Bowel sounds are normal. She exhibits no distension. There is tenderness in the right lower quadrant and suprapubic area. There is no tenderness at McBurney's point and negative Murphy's sign.    Nursing note and vitals reviewed.   ED Course  Procedures (including critical care time) Labs Review Labs Reviewed  COMPREHENSIVE METABOLIC PANEL - Abnormal; Notable for the following:    Glucose, Bld 149 (*)    Total Protein 8.2 (*)    All other components within normal limits  LIPASE, BLOOD  CBC  URINALYSIS, ROUTINE W REFLEX MICROSCOPIC (NOT AT [facility])  I-STAT BETA HCG BLOOD, ED ([facility], [facility], [facility] ONLY)    Imaging Review Dg Abd Acute W/chest  10/16/2015  CLINICAL DATA:  25 year old female with abdominal pain and constipation. EXAM: DG ABDOMEN ACUTE W/ 1V CHEST COMPARISON:  Abdominal CT dated 07/08/2015 FINDINGS: The lungs  are clear. There is no pleural effusion or pneumothorax. The cardiac silhouette is within normal limits. There is air and moderate amount of stool throughout the colon. Six there is no bowel dilatation or evidence of obstruction. No radiopaque calculi or foreign object noted. No free air. The soft tissues and the osseous structures are grossly unremarkable. IMPRESSION: Negative abdominal radiographs.  No acute cardiopulmonary disease. Electronically Signed   By: Anner Crete M.D.   On: 10/16/2015 01:19   I have personally reviewed and evaluated these images and lab results as part of my medical decision-making.   EKG Interpretation None    No specific explanation for patient's chronic abdominal pain is identified at this time, but she does have a tender pannus.  This may be abdominal wall discomfort.  I will treat her with a muscle relaxer and recommend a supportive undergarment.  MDM   Final diagnoses:  Abdominal wall pain in right lower quadrant  Abdominal panniculus, symptomatic        Junius Creamer, NP 10/16/15 [id number]  Varney Biles, MD 10/16/15 2313

## 2015-10-16 NOTE — Discharge Instructions (Signed)
Panniculus, , is a medical term describing a dense layer of fatty tissue growth, consisting of subcutaneous fat in the lower abdominal area. It can be a result of obesity and can be very uncomfortable and pulled the supporting muscles underneath. You would benefit from wearing a supportive undergarment that will help hold your abdomen.  Higher can cause less stretching of the muscles supporting your intestines These make an appointment with your doctor for further evaluation and surgical consideration

## 2015-10-16 NOTE — ED Notes (Signed)
Patient d/c'd self care.  F/U and medications reviewed.  Patient verbalized understanding. 

## 2015-10-20 ENCOUNTER — Emergency Department (HOSPITAL_COMMUNITY): Payer: Medicaid Other

## 2015-10-20 ENCOUNTER — Emergency Department (HOSPITAL_COMMUNITY)
Admission: EM | Admit: 2015-10-20 | Discharge: 2015-10-20 | Disposition: A | Discharge status: Home / Self Care | Payer: Medicaid Other | Attending: Emergency Medicine | Admitting: Emergency Medicine

## 2015-10-20 ENCOUNTER — Other Ambulatory Visit: Payer: Self-pay

## 2015-10-20 ENCOUNTER — Encounter (HOSPITAL_COMMUNITY): Payer: Self-pay | Admitting: Emergency Medicine

## 2015-10-20 DIAGNOSIS — R102 Pelvic and perineal pain: Secondary | ICD-10-CM | POA: Diagnosis not present

## 2015-10-20 DIAGNOSIS — Z7984 Long term (current) use of oral hypoglycemic drugs: Secondary | ICD-10-CM | POA: Diagnosis not present

## 2015-10-20 DIAGNOSIS — F17210 Nicotine dependence, cigarettes, uncomplicated: Secondary | ICD-10-CM | POA: Insufficient documentation

## 2015-10-20 DIAGNOSIS — E119 Type 2 diabetes mellitus without complications: Secondary | ICD-10-CM | POA: Insufficient documentation

## 2015-10-20 DIAGNOSIS — R109 Unspecified abdominal pain: Secondary | ICD-10-CM | POA: Diagnosis present

## 2015-10-20 LAB — D-DIMER, QUANTITATIVE (NOT AT ARMC)

## 2015-10-20 LAB — WET PREP, GENITAL
Sperm: NONE SEEN
Trich, Wet Prep: NONE SEEN
Yeast Wet Prep HPF POC: NONE SEEN

## 2015-10-20 LAB — COMPREHENSIVE METABOLIC PANEL
ALK PHOS: 49 U/L (ref 38–126)
ALT: 39 U/L (ref 14–54)
ANION GAP: 7 (ref 5–15)
AST: 37 U/L (ref 15–41)
Albumin: 4 g/dL (ref 3.5–5.0)
BILIRUBIN TOTAL: 0.5 mg/dL (ref 0.3–1.2)
BUN: 12 mg/dL (ref 6–20)
CALCIUM: 8.9 mg/dL (ref 8.9–10.3)
CO2: 25 mmol/L (ref 22–32)
Chloride: 105 mmol/L (ref 101–111)
Creatinine, Ser: 0.83 mg/dL (ref 0.44–1.00)
Glucose, Bld: 141 mg/dL — ABNORMAL HIGH (ref 65–99)
Potassium: 3.2 mmol/L — ABNORMAL LOW (ref 3.5–5.1)
Sodium: 137 mmol/L (ref 135–145)
TOTAL PROTEIN: 6.8 g/dL (ref 6.5–8.1)

## 2015-10-20 LAB — CBC WITH DIFFERENTIAL/PLATELET
BASOS ABS: 0 10*3/uL (ref 0.0–0.1)
Basophils Relative: 0 %
EOS ABS: 0.4 10*3/uL (ref 0.0–0.7)
EOS PCT: 4 %
HCT: 36.6 % (ref 36.0–46.0)
Hemoglobin: 11.7 g/dL — ABNORMAL LOW (ref 12.0–15.0)
Lymphocytes Relative: 38 %
Lymphs Abs: 3.3 10*3/uL (ref 0.7–4.0)
MCH: 25.6 pg — AB (ref 26.0–34.0)
MCHC: 32 g/dL (ref 30.0–36.0)
MCV: 80.1 fL (ref 78.0–100.0)
Monocytes Absolute: 0.5 10*3/uL (ref 0.1–1.0)
Monocytes Relative: 6 %
Neutro Abs: 4.6 10*3/uL (ref 1.7–7.7)
Neutrophils Relative %: 52 %
PLATELETS: 208 10*3/uL (ref 150–400)
RBC: 4.57 MIL/uL (ref 3.87–5.11)
RDW: 14.5 % (ref 11.5–15.5)
WBC: 8.8 10*3/uL (ref 4.0–10.5)

## 2015-10-20 LAB — URINALYSIS, ROUTINE W REFLEX MICROSCOPIC
BILIRUBIN URINE: NEGATIVE
GLUCOSE, UA: NEGATIVE mg/dL
Hgb urine dipstick: NEGATIVE
KETONES UR: 15 mg/dL — AB
Leukocytes, UA: NEGATIVE
NITRITE: NEGATIVE
PH: 5.5 (ref 5.0–8.0)
Protein, ur: 30 mg/dL — AB
Specific Gravity, Urine: 1.033 — ABNORMAL HIGH (ref 1.005–1.030)

## 2015-10-20 LAB — URINE MICROSCOPIC-ADD ON

## 2015-10-20 LAB — RPR: RPR: NONREACTIVE

## 2015-10-20 LAB — POC URINE PREG, ED: PREG TEST UR: NEGATIVE

## 2015-10-20 LAB — LIPASE, BLOOD: Lipase: 45 U/L (ref 11–51)

## 2015-10-20 LAB — HIV ANTIBODY (ROUTINE TESTING W REFLEX): HIV Screen 4th Generation wRfx: NONREACTIVE

## 2015-10-20 MED ORDER — ONDANSETRON HCL 4 MG/2ML IJ SOLN
4.0000 mg | Freq: Once | INTRAMUSCULAR | Status: AC
  Administered 2015-10-20: 4 mg via INTRAVENOUS
  Filled 2015-10-20: qty 2

## 2015-10-20 MED ORDER — MORPHINE SULFATE (PF) 4 MG/ML IV SOLN
4.0000 mg | Freq: Once | INTRAVENOUS | Status: AC
  Administered 2015-10-20: 4 mg via INTRAVENOUS
  Filled 2015-10-20: qty 1

## 2015-10-20 MED ORDER — MELOXICAM 7.5 MG PO TABS: 7.5000 mg | ORAL_TABLET | Freq: Every day | ORAL | Status: DC

## 2015-10-20 MED ORDER — OXYCODONE-ACETAMINOPHEN 5-325 MG PO TABS: 1.0000 | ORAL_TABLET | ORAL | Status: DC | PRN

## 2015-10-20 NOTE — Discharge Instructions (Signed)
Please make an appointment with the gynecologist for further evaluation and discussion of treatment options.  Pelvic Pain, Female Female pelvic pain can be caused by many different things and start from a variety of places. Pelvic pain refers to pain that is located in the lower half of the abdomen and between your hips. The pain may occur over a short period of time (acute) or may be reoccurring (chronic). The cause of pelvic pain may be related to disorders affecting the female reproductive organs (gynecologic), but it may also be related to the bladder, kidney stones, an intestinal complication, or muscle or skeletal problems. Getting help right away for pelvic pain is important, especially if there has been severe, sharp, or a sudden onset of unusual pain. It is also important to get help right away because some types of pelvic pain can be life threatening.  CAUSES  Below are only some of the causes of pelvic pain. The causes of pelvic pain can be in one of several categories.   Gynecologic.  Pelvic inflammatory disease.  Sexually transmitted infection.  Ovarian cyst or a twisted ovarian ligament (ovarian torsion).  Uterine lining that grows outside the uterus (endometriosis).  Fibroids, cysts, or tumors.  Ovulation.  Pregnancy.  Pregnancy that occurs outside the uterus (ectopic pregnancy).  Miscarriage.  Labor.  Abruption of the placenta or ruptured uterus.  Infection.  Uterine infection (endometritis).  Bladder infection.  Diverticulitis.  Miscarriage related to a uterine infection (septic abortion).  Bladder.  Inflammation of the bladder (cystitis).  Kidney stone(s).  Gastrointestinal.  Constipation.  Diverticulitis.  Neurologic.  Trauma.  Feeling pelvic pain because of mental or emotional causes (psychosomatic).  Cancers of the bowel or pelvis. EVALUATION  Your caregiver will want to take a careful history of your concerns. This includes recent  changes in your health, a careful gynecologic history of your periods (menses), and a sexual history. Obtaining your family history and medical history is also important. Your caregiver may suggest a pelvic exam. A pelvic exam will help identify the location and severity of the pain. It also helps in the evaluation of which organ system may be involved. In order to identify the cause of the pelvic pain and be properly treated, your caregiver may order tests. These tests may include:   A pregnancy test.  Pelvic ultrasonography.  An X-ray exam of the abdomen.  A urinalysis or evaluation of vaginal discharge.  Blood tests. HOME CARE INSTRUCTIONS   Only take over-the-counter or prescription medicines for pain, discomfort, or fever as directed by your caregiver.   Rest as directed by your caregiver.   Eat a balanced diet.   Drink enough fluids to make your urine clear or pale yellow, or as directed.   Avoid sexual intercourse if it causes pain.   Apply warm or cold compresses to the lower abdomen depending on which one helps the pain.   Avoid stressful situations.   Keep a journal of your pelvic pain. Write down when it started, where the pain is located, and if there are things that seem to be associated with the pain, such as food or your menstrual cycle.  Follow up with your caregiver as directed.  SEEK MEDICAL CARE IF:  Your medicine does not help your pain.  You have abnormal vaginal discharge. SEEK IMMEDIATE MEDICAL CARE IF:   You have heavy bleeding from the vagina.   Your pelvic pain increases.   You feel light-headed or faint.   You have chills.  You have pain with urination or blood in your urine.   You have uncontrolled diarrhea or vomiting.   You have a fever or persistent symptoms for more than 3 days.  You have a fever and your symptoms suddenly get worse.   You are being physically or sexually abused.   This information is not intended  to replace advice given to you by your health care provider. Make sure you discuss any questions you have with your health care provider.   Document Released: 03/10/2004 Document Revised: 01/02/2015 Document Reviewed: 08/03/2011 Elsevier Interactive Patient Education 2016 [employer]  Uterine Fibroids Uterine fibroids are tissue masses (tumors) that can develop in the womb (uterus). They are also called leiomyomas. This type of tumor is not cancerous (benign) and does not spread to other parts of the body outside of the pelvic area, which is between the hip bones. Occasionally, fibroids may develop in the fallopian tubes, in the cervix, or on the support structures (ligaments) that surround the uterus. You can have one or many fibroids. Fibroids can vary in size, weight, and where they grow in the uterus. Some can become quite large. Most fibroids do not require medical treatment. CAUSES A fibroid can develop when a single uterine cell keeps growing (replicating). Most cells in the human body have a control mechanism that keeps them from replicating without control. SIGNS AND SYMPTOMS Symptoms may include:   Heavy bleeding during your period.  Bleeding or spotting between periods.  Pelvic pain and pressure.  Bladder problems, such as needing to urinate more often (urinary frequency) or urgently.  Inability to reproduce offspring (infertility).  Miscarriages. DIAGNOSIS Uterine fibroids are diagnosed through a physical exam. Your health care provider may feel the lumpy tumors during a pelvic exam. Ultrasonography and an MRI may be done to determine the size, location, and number of fibroids. TREATMENT Treatment may include:  Watchful waiting. This involves getting the fibroid checked by your health care provider to see if it grows or shrinks. Follow your health care provider's recommendations for how often to have this checked.  Hormone medicines. These can be taken by mouth or given  through an intrauterine device (IUD).  Surgery.  Removing the fibroids (myomectomy) or the uterus (hysterectomy).  Removing blood supply to the fibroids (uterine artery embolization). If fibroids interfere with your fertility and you want to become pregnant, your health care provider may recommend having the fibroids removed.  HOME CARE INSTRUCTIONS  Keep all follow-up visits as directed by your health care provider. This is important.  Take medicines only as directed by your health care provider.  If you were prescribed a hormone treatment, take the hormone medicines exactly as directed.  Do not take aspirin, because it can cause bleeding.  Ask your health care provider about taking iron pills and increasing the amount of dark green, leafy vegetables in your diet. These actions can help to boost your blood iron levels, which may be affected by heavy menstrual bleeding.  Pay close attention to your period and tell your health care provider about any changes, such as:  Increased blood flow that requires you to use more pads or tampons than usual per month.  A change in the number of days that your period lasts per month.  A change in symptoms that are associated with your period, such as abdominal cramping or back pain. SEEK MEDICAL CARE IF:  You have pelvic pain, back pain, or abdominal cramps that cannot be controlled with medicines.  You have an increase in bleeding between and during periods.  You soak tampons or pads in a half hour or less.  You feel lightheaded, extra tired, or weak. SEEK IMMEDIATE MEDICAL CARE IF:  You faint.  You have a sudden increase in pelvic pain.   This information is not intended to replace advice given to you by your health care provider. Make sure you discuss any questions you have with your health care provider.   Document Released: 04/10/2000 Document Revised: 05/04/2014 Document Reviewed: 10/10/2013 Elsevier Interactive Patient Education  2016 [employer]  Meloxicam tablets What is this medicine? MELOXICAM (mel OX i cam) is a non-steroidal anti-inflammatory drug (NSAID). It is used to reduce swelling and to treat pain. It may be used for osteoarthritis, rheumatoid arthritis, or juvenile rheumatoid arthritis. This medicine may be used for other purposes; ask your health care provider or pharmacist if you have questions. What should I tell my health care provider before I take this medicine? They need to know if you have any of these conditions: -bleeding disorders -cigarette smoker -coronary artery bypass graft (CABG) surgery within the past 2 weeks -drink more than 3 alcohol-containing drinks per day -heart disease -high blood pressure -history of stomach bleeding -kidney disease -liver disease -lung or breathing disease, like asthma -stomach or intestine problems -an unusual or allergic reaction to meloxicam, aspirin, other NSAIDs, other medicines, foods, dyes, or preservatives -pregnant or trying to get pregnant -breast-feeding How should I use this medicine? Take this medicine by mouth with a full glass of water. Follow the directions on the prescription label. You can take it with or without food. If it upsets your stomach, take it with food. Take your medicine at regular intervals. Do not take it more often than directed. Do not stop taking except on your doctor's advice. A special MedGuide will be given to you by the pharmacist with each prescription and refill. Be sure to read this information carefully each time. Talk to your pediatrician regarding the use of this medicine in children. While this drug may be prescribed for selected conditions, precautions do apply. Patients over 63 years old may have a stronger reaction and need a smaller dose. Overdosage: If you think you have taken too much of this medicine contact a poison control center or emergency room at once. NOTE: This medicine is only for you. Do not  share this medicine with others. What if I miss a dose? If you miss a dose, take it as soon as you can. If it is almost time for your next dose, take only that dose. Do not take double or extra doses. What may interact with this medicine? Do not take this medicine with any of the following medications: -cidofovir -ketorolac This medicine may also interact with the following medications: -aspirin and aspirin-like medicines -certain medicines for blood pressure, heart disease, irregular heart beat -certain medicines for depression, anxiety, or psychotic disturbances -certain medicines that treat or prevent blood clots like warfarin, enoxaparin, dalteparin, apixaban, dabigatran, rivaroxaban -cyclosporine -digoxin -diuretics -methotrexate -other NSAIDs, medicines for pain and inflammation, like ibuprofen and naproxen -pemetrexed This list may not describe all possible interactions. Give your health care provider a list of all the medicines, herbs, non-prescription drugs, or dietary supplements you use. Also tell them if you smoke, drink alcohol, or use illegal drugs. Some items may interact with your medicine. What should I watch for while using this medicine? Tell your doctor or healthcare professional if your symptoms do not start  to get better or if they get worse. Do not take other medicines that contain aspirin, ibuprofen, or naproxen with this medicine. Side effects such as stomach upset, nausea, or ulcers may be more likely to occur. Many medicines available without a prescription should not be taken with this medicine. This medicine can cause ulcers and bleeding in the stomach and intestines at any time during treatment. This can happen with no warning and may cause death. There is increased risk with taking this medicine for a long time. Smoking, drinking alcohol, older age, and poor health can also increase risks. Call your doctor right away if you have stomach pain or blood in your vomit  or stool. This medicine does not prevent heart attack or stroke. In fact, this medicine may increase the chance of a heart attack or stroke. The chance may increase with longer use of this medicine and in people who have heart disease. If you take aspirin to prevent heart attack or stroke, talk with your doctor or health care professional. What side effects may I notice from receiving this medicine? Side effects that you should report to your doctor or health care professional as soon as possible: -allergic reactions like skin rash, itching or hives, swelling of the face, lips, or tongue -nausea, vomiting -signs and symptoms of a blood clot such as breathing problems; changes in vision; chest pain; severe, sudden headache; pain, swelling, warmth in the leg; trouble speaking; sudden numbness or weakness of the face, arm, or leg -signs and symptoms of bleeding such as bloody or black, tarry stools; red or dark-brown urine; spitting up blood or brown material that looks like coffee grounds; red spots on the skin; unusual bruising or bleeding from the eye, gums, or nose -signs and symptoms of liver injury like dark yellow or brown urine; general ill feeling or flu-like symptoms; light-colored stools; loss of appetite; nausea; right upper belly pain; unusually weak or tired; yellowing of the eyes or skin -signs and symptoms of stroke like changes in vision; confusion; trouble speaking or understanding; severe headaches; sudden numbness or weakness of the face, arm, or leg; trouble walking; dizziness; loss of balance or coordination Side effects that usually do not require medical attention (report these to your doctor or health care professional if they continue or are bothersome): -constipation -diarrhea -gas This list may not describe all possible side effects. Call your doctor for medical advice about side effects. You may report side effects to FDA at 1-800-FDA-1088. Where should I keep my  medicine? Keep out of the reach of children. Store at room temperature between 15 and 30 degrees C (59 and 86 degrees F). Throw away any unused medicine after the expiration date. NOTE: This sheet is a summary. It may not cover all possible information. If you have questions about this medicine, talk to your doctor, pharmacist, or health care provider.    2016, Elsevier/[employer] Standard. (2014-11-01 13:02:23)  Acetaminophen; Oxycodone tablets What is this medicine? ACETAMINOPHEN; OXYCODONE (a set a MEE noe fen; ox i KOE done) is a pain reliever. It is used to treat moderate to severe pain. This medicine may be used for other purposes; ask your health care provider or pharmacist if you have questions. What should I tell my health care provider before I take this medicine? They need to know if you have any of these conditions: -brain tumor -Crohn's disease, inflammatory bowel disease, or ulcerative colitis -drug abuse or addiction -head injury -heart or circulation problems -if you often  drink alcohol -kidney disease or problems going to the bathroom -liver disease -lung disease, asthma, or breathing problems -an unusual or allergic reaction to acetaminophen, oxycodone, other opioid analgesics, other medicines, foods, dyes, or preservatives -pregnant or trying to get pregnant -breast-feeding How should I use this medicine? Take this medicine by mouth with a full glass of water. Follow the directions on the prescription label. You can take it with or without food. If it upsets your stomach, take it with food. Take your medicine at regular intervals. Do not take it more often than directed. Talk to your pediatrician regarding the use of this medicine in children. Special care may be needed. Patients over 57 years old may have a stronger reaction and need a smaller dose. Overdosage: If you think you have taken too much of this medicine contact a poison control center or emergency room at  once. NOTE: This medicine is only for you. Do not share this medicine with others. What if I miss a dose? If you miss a dose, take it as soon as you can. If it is almost time for your next dose, take only that dose. Do not take double or extra doses. What may interact with this medicine? -alcohol -antihistamines -barbiturates like amobarbital, butalbital, butabarbital, methohexital, pentobarbital, phenobarbital, thiopental, and secobarbital -benztropine -drugs for bladder problems like solifenacin, trospium, oxybutynin, tolterodine, hyoscyamine, and methscopolamine -drugs for breathing problems like ipratropium and tiotropium -drugs for certain stomach or intestine problems like propantheline, homatropine methylbromide, glycopyrrolate, atropine, belladonna, and dicyclomine -general anesthetics like etomidate, ketamine, nitrous oxide, propofol, desflurane, enflurane, halothane, isoflurane, and sevoflurane -medicines for depression, anxiety, or psychotic disturbances -medicines for sleep -muscle relaxants -naltrexone -narcotic medicines (opiates) for pain -phenothiazines like perphenazine, thioridazine, chlorpromazine, mesoridazine, fluphenazine, prochlorperazine, promazine, and trifluoperazine -scopolamine -tramadol -trihexyphenidyl This list may not describe all possible interactions. Give your health care provider a list of all the medicines, herbs, non-prescription drugs, or dietary supplements you use. Also tell them if you smoke, drink alcohol, or use illegal drugs. Some items may interact with your medicine. What should I watch for while using this medicine? Tell your doctor or health care professional if your pain does not go away, if it gets worse, or if you have new or a different type of pain. You may develop tolerance to the medicine. Tolerance means that you will need a higher dose of the medication for pain relief. Tolerance is normal and is expected if you take this medicine for  a long time. Do not suddenly stop taking your medicine because you may develop a severe reaction. Your body becomes used to the medicine. This does NOT mean you are addicted. Addiction is a behavior related to getting and using a drug for a non-medical reason. If you have pain, you have a medical reason to take pain medicine. Your doctor will tell you how much medicine to take. If your doctor wants you to stop the medicine, the dose will be slowly lowered over time to avoid any side effects. You may get drowsy or dizzy. Do not drive, use machinery, or do anything that needs mental alertness until you know how this medicine affects you. Do not stand or sit up quickly, especially if you are an older patient. This reduces the risk of dizzy or fainting spells. Alcohol may interfere with the effect of this medicine. Avoid alcoholic drinks. There are different types of narcotic medicines (opiates) for pain. If you take more than one type at the same time, you may  have more side effects. Give your health care provider a list of all medicines you use. Your doctor will tell you how much medicine to take. Do not take more medicine than directed. Call emergency for help if you have problems breathing. The medicine will cause constipation. Try to have a bowel movement at least every 2 to 3 days. If you do not have a bowel movement for 3 days, call your doctor or health care professional. Do not take Tylenol (acetaminophen) or medicines that have acetaminophen with this medicine. Too much acetaminophen can be very dangerous. Many nonprescription medicines contain acetaminophen. Always read the labels carefully to avoid taking more acetaminophen. What side effects may I notice from receiving this medicine? Side effects that you should report to your doctor or health care professional as soon as possible: -allergic reactions like skin rash, itching or hives, swelling of the face, lips, or tongue -breathing difficulties,  wheezing -confusion -light headedness or fainting spells -severe stomach pain -unusually weak or tired -yellowing of the skin or the whites of the eyes Side effects that usually do not require medical attention (report to your doctor or health care professional if they continue or are bothersome): -dizziness -drowsiness -nausea -vomiting This list may not describe all possible side effects. Call your doctor for medical advice about side effects. You may report side effects to FDA at 1-800-FDA-1088. Where should I keep my medicine? Keep out of the reach of children. This medicine can be abused. Keep your medicine in a safe place to protect it from theft. Do not share this medicine with anyone. Selling or giving away this medicine is dangerous and against the law. This medicine may cause accidental overdose and death if it taken by other adults, children, or pets. Mix any unused medicine with a substance like cat litter or coffee grounds. Then throw the medicine away in a sealed container like a sealed bag or a coffee can with a lid. Do not use the medicine after the expiration date. Store at room temperature between 20 and 25 degrees C (68 and 77 degrees F). NOTE: This sheet is a summary. It may not cover all possible information. If you have questions about this medicine, talk to your doctor, pharmacist, or health care provider.    2016, [employer]. (2014-03-14 15:18:46)

## 2015-10-20 NOTE — ED Notes (Signed)
Pt brought by [facility] from home for 5/10 abd pain, pt states she vomited 5 times in the last 30 mins, having constant abd pain for the past 4 months.

## 2015-10-20 NOTE — ED Provider Notes (Signed)
CSN: [id number]     Arrival date & time 10/20/15  0418 History   First MD Initiated Contact with Patient 10/20/15 [phone]     Chief Complaint  Patient presents with  . Abdominal Pain     (Consider location/radiation/quality/duration/timing/severity/associated sxs/prior Treatment) Patient is a 25 y.o. female presenting with abdominal pain. The history is provided by the patient.  Abdominal Pain She is complaining of lower abdominal pain which has been present for about 5-6 months. Pain is sharp. She is not aware of anything making it better or anything making me worse. She currently rates pain at 7/10. It has been getting worse. Tonight, she developed nausea and vomiting. Pain is not improved after vomiting. She denies constipation or diarrhea. She denies urinary urgency, frequency, tenesmus, dysuria. She denies constipation or diarrhea. There's been no vaginal discharge. She states that she has not had menses in over a year because she is on birth control. She is also a cigarette smoker. Tonight, she noted dyspnea. There is very faint chest discomfort associated with it. She is not currently taking any medication. She has been evaluated for her abdominal pain several times with no etiology having been found.  Past Medical History  Diagnosis Date  . Anxiety   . Mental disorder   . Depression   . Cognitive deficits   . Bipolar 1 disorder ([place])   . Diabetes mellitus without complication ([place])   . Obesity    Past Surgical History  Procedure Laterality Date  . Cesarean section    . Tonsillectomy N/A 06/03/2012    Procedure: TONSILLECTOMY;  Surgeon: [name], MD;  Location: [place];  Service: ENT;  Laterality: N/A;  . Mass excision N/A 06/03/2012    Procedure: EXCISION MASS;  Surgeon: [name], MD;  Location: [place];  Service: ENT;  Laterality: N/A;  Excision uvula mass  . Cesarean section N/A 04/25/2013    Procedure: REPEAT CESAREAN SECTION;   Surgeon: [name], MD;  Location: [facility];  Service: Obstetrics;  Laterality: N/A;  . Tonsillectomy     Family History  Problem Relation Age of Onset  . Hypertension Mother   . Diabetes Father    Social History  Substance Use Topics  . Smoking status: Current Every Day Smoker -- 1.00 packs/day for 7 years    Types: Cigarettes    Last Attempt to Quit: 06/30/2015  . Smokeless tobacco: Never Used  . Alcohol Use: No   OB History    Gravida Para Term Preterm AB TAB SAB Ectopic Multiple Living   3 3 3  0 0 0 0 0 0 3     Review of Systems  Gastrointestinal: Positive for abdominal pain.  All other systems reviewed and are negative.     Allergies  Omnipaque and Penicillins  Home Medications   Prior to Admission medications   Medication Sig Start Date End Date Taking? Authorizing Provider  benztropine (COGENTIN) 0.5 MG tablet Take 1 tablet (0.5 mg total) by mouth at bedtime. Patient not taking: Reported on 07/31/2015 07/23/15   Kerrie Buffalo, NP  busPIRone (BUSPAR) 15 MG tablet Take 1 tablet (15 mg total) by mouth 3 (three) times daily. Patient taking differently: Take 15 mg by mouth 2 (two) times daily.  07/23/15   Kerrie Buffalo, NP  cetirizine (ZYRTEC) 10 MG tablet Take 1 tablet (10 mg total) by mouth daily. For allergies Patient taking differently: Take 10 mg by mouth daily as needed for allergies.  07/23/15  Kerrie Buffalo, NP  diphenhydrAMINE (BENADRYL) 25 MG tablet Take 1 tablet (25 mg total) by mouth every 6 (six) hours. Patient not taking: Reported on 10/02/2015 09/16/15   Lahoma Crocker Barrett, PA-C  DULoxetine (CYMBALTA) 60 MG capsule Take 60 mg by mouth daily. 09/25/15   Historical Provider, MD  fluvoxaMINE (LUVOX) 100 MG tablet Take 100 mg by mouth daily.  09/25/15   Historical Provider, MD  fluvoxaMINE (LUVOX) 25 MG tablet Take 3 tablets (75 mg total) by mouth daily with breakfast. Patient not taking: Reported on 10/02/2015 07/23/15   Kerrie Buffalo, NP  fluvoxaMINE (LUVOX) 50 MG  tablet Take 1 tablet (50 mg total) by mouth at bedtime. Patient not taking: Reported on 10/02/2015 07/23/15   Kerrie Buffalo, NP  glipiZIDE (GLUCOTROL XL) 5 MG 24 hr tablet Take 1 tablet (5 mg total) by mouth daily with breakfast. Patient taking differently: Take 10 mg by mouth daily with breakfast.  07/23/15   Kerrie Buffalo, NP  haloperidol (HALDOL) 0.5 MG tablet Take 15 tablets (7.5 mg total) by mouth at bedtime. Patient not taking: Reported on 10/15/2015 07/23/15   Kerrie Buffalo, NP  hydrocortisone cream 1 % Apply to affected area 2 times daily Patient not taking: Reported on 10/02/2015 09/16/15   Lahoma Crocker Barrett, PA-C  ibuprofen (ADVIL,MOTRIN) 400 MG tablet Take 1 tablet (400 mg total) by mouth every 6 (six) hours as needed. 10/16/15   Junius Creamer, NP  metFORMIN (GLUCOPHAGE) 500 MG tablet Take 1 tablet (500 mg total) by mouth 2 (two) times daily. For diabetes management 07/23/15   Kerrie Buffalo, NP  nicotine polacrilex (NICORETTE) 2 MG gum Take 1 each (2 mg total) by mouth as needed for smoking cessation. Patient not taking: Reported on 10/02/2015 07/23/15   Kerrie Buffalo, NP  simvastatin (ZOCOR) 20 MG tablet Take 1 tablet (20 mg total) by mouth daily at 6 PM. Patient not taking: Reported on 10/06/2015 07/23/15   Kerrie Buffalo, NP  traZODone (DESYREL) 50 MG tablet Take 1 tablet (50 mg total) by mouth at bedtime and may repeat dose one time if needed. Patient not taking: Reported on 07/31/2015 07/23/15   Kerrie Buffalo, NP   BP 143/95 mmHg  Pulse 97  Temp(Src) 99.2 F (37.3 C) (Oral)  Resp 18  SpO2 100% Physical Exam  Nursing note and vitals reviewed.  Morbidly obese 25 year old female, resting comfortably and in no acute distress. Vital signs are significant for hypertension. Oxygen saturation is 100%, which is normal. Head is normocephalic and atraumatic. PERRLA, EOMI. Oropharynx is clear. Neck is nontender and supple without adenopathy or JVD. Back is nontender and there is no CVA  tenderness. Lungs are clear without rales, wheezes, or rhonchi. Chest is nontender. Heart has regular rate and rhythm without murmur. Abdomen is soft, flat, with tenderness across the lower abdomen. There is no rebound or guarding. There are no masses or hepatosplenomegaly and peristalsis is normoactive. Pelvic: Normal external female genitalia. Cervix is closed. No cervical motion tenderness and no adnexal masses or tenderness. Exam is difficult because of body habitus but there does appear to be tenderness in the uterine fundus which does extend to approximately 16 week size. Extremities have no cyanosis or edema, full range of motion is present. Skin is warm and dry without rash. Neurologic: Mental status is normal, cranial nerves are intact, there are no motor or sensory deficits.  ED Course  Procedures (including critical care time) Labs Review Results for orders placed or performed during the hospital encounter of  10/20/15  Comprehensive metabolic panel  Result Value Ref Range   Sodium 137 135 - 145 mmol/L   Potassium 3.2 (L) 3.5 - 5.1 mmol/L   Chloride 105 101 - 111 mmol/L   CO2 25 22 - 32 mmol/L   Glucose, Bld 141 (H) 65 - 99 mg/dL   BUN 12 6 - 20 mg/dL   Creatinine, Ser 0.83 0.44 - 1.00 mg/dL   Calcium 8.9 8.9 - 10.3 mg/dL   Total Protein 6.8 6.5 - 8.1 g/dL   Albumin 4.0 3.5 - 5.0 g/dL   AST 37 15 - 41 U/L   ALT 39 14 - 54 U/L   Alkaline Phosphatase 49 38 - 126 U/L   Total Bilirubin 0.5 0.3 - 1.2 mg/dL   GFR calc non Af Amer >60 >60 mL/min   GFR calc Af Amer >60 >60 mL/min   Anion gap 7 5 - 15  Lipase, blood  Result Value Ref Range   Lipase 45 11 - 51 U/L  D-dimer, quantitative  Result Value Ref Range   D-Dimer, Quant <0.27 0.00 - 0.50 ug/mL-FEU  CBC with Differential  Result Value Ref Range   WBC 8.8 4.0 - 10.5 K/uL   RBC 4.57 3.87 - 5.11 MIL/uL   Hemoglobin 11.7 (L) 12.0 - 15.0 g/dL   HCT 36.6 36.0 - 46.0 %   MCV 80.1 78.0 - 100.0 fL   MCH 25.6 (L) 26.0 - 34.0  pg   MCHC 32.0 30.0 - 36.0 g/dL   RDW 14.5 11.5 - 15.5 %   Platelets 208 150 - 400 K/uL   Neutrophils Relative % 52 %   Neutro Abs 4.6 1.7 - 7.7 K/uL   Lymphocytes Relative 38 %   Lymphs Abs 3.3 0.7 - 4.0 K/uL   Monocytes Relative 6 %   Monocytes Absolute 0.5 0.1 - 1.0 K/uL   Eosinophils Relative 4 %   Eosinophils Absolute 0.4 0.0 - 0.7 K/uL   Basophils Relative 0 %   Basophils Absolute 0.0 0.0 - 0.1 K/uL  POC urine preg, ED  Result Value Ref Range   Preg Test, Ur NEGATIVE NEGATIVE   Imaging Review Dg Chest 2 View  10/20/2015  CLINICAL DATA:  25 year old female with shortness of breath EXAM: CHEST  2 VIEW COMPARISON:  Chest radiograph dated 10/16/2015 FINDINGS: The heart size and mediastinal contours are within normal limits. Both lungs are clear. The visualized skeletal structures are unremarkable. IMPRESSION: No active cardiopulmonary disease. Electronically Signed   By: Anner Crete M.D.   On: 10/20/2015 05:46    I have personally reviewed and evaluated these images and lab results as part of my medical decision-making.   EKG Interpretation   Date/Time:  Sunday October 20 2015 05:40:05 EDT Ventricular Rate:  95 PR Interval:    QRS Duration: 95 QT Interval:  364 QTC Calculation: 458 R Axis:   92 Text Interpretation:  Sinus rhythm Borderline right axis deviation When  compared with ECG of 07/18/2015, No significant change was found Confirmed  by [facility]  MD, Choice Kleinsasser ([id number]) on 10/20/2015 5:44:00 AM      MDM   Final diagnoses:  Pelvic pain in female    Abdominal pain of uncertain cause. Old records are reviewed and she had been evaluated on March 6 at which time she had a CT of the abdomen and pelvis which was unremarkable. Pelvic exam at that time was unremarkable. We'll repeat pelvic exam and get screening labs. Because of complaints of dyspnea, will check  d-dimer to rule out pulmonary embolism. She does have risk factors of exogenous estrogens and tobacco  abuse.  D-dimer is come back normal. When pelvic exam was done, it seemed more clear that her pain seemed to be centered around a fibroid uterus. We'll send for pelvic ultrasound. Case is signed out to Dr. Ralene Bathe.  Delora Fuel, MD [id number]

## 2015-10-21 LAB — GC/CHLAMYDIA PROBE AMP (~~LOC~~) NOT AT ARMC
Chlamydia: NEGATIVE
NEISSERIA GONORRHEA: NEGATIVE

## 2015-10-23 ENCOUNTER — Encounter (HOSPITAL_COMMUNITY): Payer: Self-pay

## 2015-10-23 ENCOUNTER — Emergency Department (HOSPITAL_COMMUNITY)
Admission: EM | Admit: 2015-10-23 | Discharge: 2015-10-23 | Disposition: A | Discharge status: Home / Self Care | Payer: Medicaid Other | Attending: Emergency Medicine | Admitting: Emergency Medicine

## 2015-10-23 DIAGNOSIS — Z7984 Long term (current) use of oral hypoglycemic drugs: Secondary | ICD-10-CM | POA: Insufficient documentation

## 2015-10-23 DIAGNOSIS — E119 Type 2 diabetes mellitus without complications: Secondary | ICD-10-CM | POA: Insufficient documentation

## 2015-10-23 DIAGNOSIS — R10817 Generalized abdominal tenderness: Secondary | ICD-10-CM | POA: Insufficient documentation

## 2015-10-23 DIAGNOSIS — J020 Streptococcal pharyngitis: Secondary | ICD-10-CM | POA: Diagnosis not present

## 2015-10-23 DIAGNOSIS — F17210 Nicotine dependence, cigarettes, uncomplicated: Secondary | ICD-10-CM | POA: Insufficient documentation

## 2015-10-23 DIAGNOSIS — Z6841 Body Mass Index (BMI) 40.0 and over, adult: Secondary | ICD-10-CM | POA: Diagnosis not present

## 2015-10-23 DIAGNOSIS — J029 Acute pharyngitis, unspecified: Secondary | ICD-10-CM | POA: Diagnosis present

## 2015-10-23 DIAGNOSIS — E669 Obesity, unspecified: Secondary | ICD-10-CM | POA: Diagnosis not present

## 2015-10-23 LAB — RAPID STREP SCREEN (MED CTR MEBANE ONLY): Streptococcus, Group A Screen (Direct): POSITIVE — AB

## 2015-10-23 MED ORDER — AZITHROMYCIN 250 MG PO TABS: 250.0000 mg | ORAL_TABLET | Freq: Every day | ORAL | Status: DC

## 2015-10-23 NOTE — Discharge Instructions (Signed)
Strep Throat Strep throat is a bacterial infection of the throat. Your health care provider may call the infection tonsillitis or pharyngitis, depending on whether there is swelling in the tonsils or at the back of the throat. Strep throat is most common during the cold months of the year in children who are 5-27 years of age, but it can happen during any season in people of any age. This infection is spread from person to person (contagious) through coughing, sneezing, or close contact. CAUSES Strep throat is caused by the bacteria called Streptococcus pyogenes. RISK FACTORS This condition is more likely to develop in:  People who spend time in crowded places where the infection can spread easily.  People who have close contact with someone who has strep throat. SYMPTOMS Symptoms of this condition include:  Fever or chills.   Redness, swelling, or pain in the tonsils or throat.  Pain or difficulty when swallowing.  White or yellow spots on the tonsils or throat.  Swollen, tender glands in the neck or under the jaw.  Red rash all over the body (rare). DIAGNOSIS This condition is diagnosed by performing a rapid strep test or by taking a swab of your throat (throat culture test). Results from a rapid strep test are usually ready in a few minutes, but throat culture test results are available after one or two days. TREATMENT This condition is treated with antibiotic medicine. HOME CARE INSTRUCTIONS Medicines  Take over-the-counter and prescription medicines only as told by your health care provider.  Take your antibiotic as told by your health care provider. Do not stop taking the antibiotic even if you start to feel better.  Have family members who also have a sore throat or fever tested for strep throat. They may need antibiotics if they have the strep infection. Eating and Drinking  Do not share food, drinking cups, or personal items that could cause the infection to spread to  other people.  If swallowing is difficult, try eating soft foods until your sore throat feels better.  Drink enough fluid to keep your urine clear or pale yellow. General Instructions  Gargle with a salt-water mixture 3-4 times per day or as needed. To make a salt-water mixture, completely dissolve -1 tsp of salt in 1 cup of warm water.  Make sure that all household members wash their hands well.  Get plenty of rest.  Stay home from school or work until you have been taking antibiotics for 24 hours.  Keep all follow-up visits as told by your health care provider. This is important. SEEK MEDICAL CARE IF:  The glands in your neck continue to get bigger.  You develop a rash, cough, or earache.  You cough up a thick liquid that is green, yellow-brown, or bloody.  You have pain or discomfort that does not get better with medicine.  Your problems seem to be getting worse rather than better.  You have a fever. SEEK IMMEDIATE MEDICAL CARE IF:  You have new symptoms, such as vomiting, severe headache, stiff or painful neck, chest pain, or shortness of breath.  You have severe throat pain, drooling, or changes in your voice.  You have swelling of the neck, or the skin on the neck becomes red and tender.  You have signs of dehydration, such as fatigue, dry mouth, and decreased urination.  You become increasingly sleepy, or you cannot wake up completely.  Your joints become red or painful.   This information is not intended to replace   advice given to you by your health care provider. Make sure you discuss any questions you have with your health care provider.   Document Released: 04/10/2000 Document Revised: 01/02/2015 Document Reviewed: 08/06/2014 Elsevier Interactive Patient Education 2016 [employer]  

## 2015-10-23 NOTE — ED Notes (Signed)
Patient diagnosed with fibroids and has been having increased pain with abdominal cramping for months.  Has appointment with GYN in August. Also wants her thyroid checked for hoarseness. Taking BC pills to control her previous heavy periods.

## 2015-10-23 NOTE — ED Provider Notes (Signed)
CSN: [id number]     Arrival date & time 10/23/15  0846 History   First MD Initiated Contact with Patient 10/23/15 [phone]     Chief Complaint  Patient presents with  . vaginal pain/abdominal pain    HPI Pt has been having trouble for the past two years.  She has seen numerous doctors.  She was told she has fibroids but has never seen a gyn doctor.  Pt thinks she needs to have surgery and wants to get evaluated for it.Patient states her symptoms have been getting worse over the last 6 months. She's had some trouble with nausea and vomiting. She has vaginal irritation and swelling.  The patient has been going to her primary doctor but says nothing was being done for her. I reviewed the patient's visit with her from 3 days ago. She had a pelvic exam, laboratory tests as well as a pelvic ultrasound. Her HIV GC and chlamydia tests were negative. Her pelvic ultrasound did not show any evidence of uterine fibroids. There was a small cyst.  Pt also complains of soreness in her throat.  She feels like it is hard to talk sometimes. patient feels like her voice is getting deeper. She thinks it's an issue with her thyroid.  Patient requested to have her thyroid levels checked. I Reviewed the medical records with her in 3 months ago she had a TSH that was in the normal range. Past Medical History  Diagnosis Date  . Anxiety   . Mental disorder   . Depression   . Cognitive deficits   . Bipolar 1 disorder ([place])   . Diabetes mellitus without complication ([place])   . Obesity    Past Surgical History  Procedure Laterality Date  . Cesarean section    . Tonsillectomy N/A 06/03/2012    Procedure: TONSILLECTOMY;  Surgeon: [name], MD;  Location: [place];  Service: ENT;  Laterality: N/A;  . Mass excision N/A 06/03/2012    Procedure: EXCISION MASS;  Surgeon: [name], MD;  Location: [place];  Service: ENT;  Laterality: N/A;  Excision uvula mass  . Cesarean section N/A  04/25/2013    Procedure: REPEAT CESAREAN SECTION;  Surgeon: [name], MD;  Location: [facility];  Service: Obstetrics;  Laterality: N/A;  . Tonsillectomy     Family History  Problem Relation Age of Onset  . Hypertension Mother   . Diabetes Father    Social History  Substance Use Topics  . Smoking status: Current Every Day Smoker -- 1.00 packs/day for 7 years    Types: Cigarettes    Last Attempt to Quit: 06/30/2015  . Smokeless tobacco: Never Used  . Alcohol Use: No   OB History    Gravida Para Term Preterm AB TAB SAB Ectopic Multiple Living   3 3 3  0 0 0 0 0 0 3     Review of Systems  All other systems reviewed and are negative.     Allergies  Omnipaque and Penicillins  Home Medications   Prior to Admission medications   Medication Sig Start Date End Date Taking? Authorizing Provider  busPIRone (BUSPAR) 15 MG tablet Take 1 tablet (15 mg total) by mouth 3 (three) times daily. Patient taking differently: Take 15 mg by mouth 2 (two) times daily.  07/23/15  Yes Kerrie Buffalo, NP  DULoxetine (CYMBALTA) 60 MG capsule Take 60 mg by mouth daily. 09/25/15  Yes Historical Provider, MD  fluvoxaMINE (LUVOX) 25 MG tablet Take 3 tablets (  75 mg total) by mouth daily with breakfast. Patient taking differently: Take 100 mg by mouth daily with breakfast.  07/23/15  Yes Kerrie Buffalo, NP  glipiZIDE (GLUCOTROL XL) 5 MG 24 hr tablet Take 1 tablet (5 mg total) by mouth daily with breakfast. Patient taking differently: Take 10 mg by mouth daily with breakfast.  07/23/15  Yes Kerrie Buffalo, NP  meloxicam (MOBIC) 7.5 MG tablet Take 1 tablet (7.5 mg total) by mouth daily. [id number]  Yes Delora Fuel, MD  metFORMIN (GLUCOPHAGE) 500 MG tablet Take 1 tablet (500 mg total) by mouth 2 (two) times daily. For diabetes management 07/23/15  Yes Kerrie Buffalo, NP  oxyCODONE-acetaminophen (PERCOCET) 5-325 MG tablet Take 1 tablet by mouth every 4 (four) hours as needed for moderate pain. [id number]  Yes Delora Fuel, MD  azithromycin (ZITHROMAX) 250 MG tablet Take 1 tablet (250 mg total) by mouth daily. Take first 2 tablets together, then 1 every day until finished. 10/23/15   Dorie Rank, MD  cetirizine (ZYRTEC) 10 MG tablet Take 1 tablet (10 mg total) by mouth daily. For allergies Patient not taking: Reported on 10/23/2015 07/23/15   Kerrie Buffalo, NP  simvastatin (ZOCOR) 20 MG tablet Take 1 tablet (20 mg total) by mouth daily at 6 PM. Patient not taking: Reported on 10/23/2015 07/23/15   Kerrie Buffalo, NP   BP 134/122 mmHg  Pulse 107  Temp(Src) 98.8 F (37.1 C)  Resp 18  Ht 5\' 7"  (1.702 m)  Wt 126.1 kg  BMI 43.53 kg/m2  SpO2 99% Physical Exam  Constitutional: She appears well-developed and well-nourished. No distress.  Obese  HENT:  Head: Normocephalic and atraumatic.  Right Ear: External ear normal.  Left Ear: External ear normal.  Mouth/Throat: No oropharyngeal exudate.  No uvula swelling,no posterior pharyngeal swelling  Eyes: Conjunctivae are normal. Right eye exhibits no discharge. Left eye exhibits no discharge. No scleral icterus.  Neck: Neck supple. No tracheal deviation present. No thyromegaly present.  Cardiovascular: Normal rate, regular rhythm and intact distal pulses.   Pulmonary/Chest: Effort normal and breath sounds normal. No stridor. No respiratory distress. She has no wheezes. She has no rales.  Abdominal: Soft. Bowel sounds are normal. She exhibits no distension. There is generalized tenderness. There is no rebound and no guarding.  Musculoskeletal: She exhibits no edema or tenderness.  Neurological: She is alert. She has normal strength. No cranial nerve deficit (no facial droop, extraocular movements intact, no slurred speech) or sensory deficit. She exhibits normal muscle tone. She displays no seizure activity. Coordination normal.  Skin: Skin is warm and dry. No rash noted.  Psychiatric: She has a normal mood and affect.  Nursing note and vitals reviewed.   ED  Course  Procedures (including critical care time) Labs Review Labs Reviewed  RAPID STREP SCREEN (NOT AT [facility]) - Abnormal; Notable for the following:    Streptococcus, Group A Screen (Direct) POSITIVE (*)    All other components within normal limits     MDM   Final diagnoses:  Strep throat    Patient has been evaluated numerous times in the past for issues with abdominal pain. She had a full evaluation 3 days ago. Her laboratory tests are reassuring. I discussed the culture results with her including the HIV test that were all negative. I also discussed the ultrasound results that did not show any evidence of fibroids.   I explained to her that it does not appear that she needs any surgery.   I discussed the  treatment that we provide in the emergency room. I explained to her that she does not appear to have any emergency condition but that does not mean that she's not having pain or requires further treatment. I explained to her that she should make an appointment with an OB/GYN doctor.  Regarding her thyroid concerns I explained to her that she did have testing 3 months ago.  I do not feel any thyromegaly on exam. She does have a sore throat I will do a strep screen.  Strep screen was positive.  Will dc home with rx for azithromycin.      Dorie Rank, MD 10/23/15 1057

## 2015-10-28 ENCOUNTER — Encounter (HOSPITAL_COMMUNITY): Payer: Self-pay

## 2015-10-28 ENCOUNTER — Emergency Department (HOSPITAL_COMMUNITY)
Admission: EM | Admit: 2015-10-28 | Discharge: 2015-10-28 | Disposition: A | Discharge status: Home / Self Care | Payer: Medicaid Other | Attending: Emergency Medicine | Admitting: Emergency Medicine

## 2015-10-28 DIAGNOSIS — F17210 Nicotine dependence, cigarettes, uncomplicated: Secondary | ICD-10-CM | POA: Diagnosis not present

## 2015-10-28 DIAGNOSIS — R197 Diarrhea, unspecified: Secondary | ICD-10-CM | POA: Insufficient documentation

## 2015-10-28 DIAGNOSIS — R1031 Right lower quadrant pain: Secondary | ICD-10-CM | POA: Diagnosis not present

## 2015-10-28 DIAGNOSIS — F329 Major depressive disorder, single episode, unspecified: Secondary | ICD-10-CM | POA: Insufficient documentation

## 2015-10-28 DIAGNOSIS — E119 Type 2 diabetes mellitus without complications: Secondary | ICD-10-CM | POA: Insufficient documentation

## 2015-10-28 DIAGNOSIS — Z7984 Long term (current) use of oral hypoglycemic drugs: Secondary | ICD-10-CM | POA: Insufficient documentation

## 2015-10-28 DIAGNOSIS — R112 Nausea with vomiting, unspecified: Secondary | ICD-10-CM | POA: Diagnosis present

## 2015-10-28 LAB — CBC
HEMATOCRIT: 40.7 % (ref 36.0–46.0)
HEMOGLOBIN: 13 g/dL (ref 12.0–15.0)
MCH: 26.2 pg (ref 26.0–34.0)
MCHC: 31.9 g/dL (ref 30.0–36.0)
MCV: 82.1 fL (ref 78.0–100.0)
Platelets: 217 10*3/uL (ref 150–400)
RBC: 4.96 MIL/uL (ref 3.87–5.11)
RDW: 14.6 % (ref 11.5–15.5)
WBC: 8.4 10*3/uL (ref 4.0–10.5)

## 2015-10-28 LAB — COMPREHENSIVE METABOLIC PANEL
ALBUMIN: 4.1 g/dL (ref 3.5–5.0)
ALK PHOS: 51 U/L (ref 38–126)
ALT: 36 U/L (ref 14–54)
ANION GAP: 7 (ref 5–15)
AST: 36 U/L (ref 15–41)
BUN: 9 mg/dL (ref 6–20)
CALCIUM: 9.4 mg/dL (ref 8.9–10.3)
CO2: 27 mmol/L (ref 22–32)
Chloride: 107 mmol/L (ref 101–111)
Creatinine, Ser: 0.82 mg/dL (ref 0.44–1.00)
GFR calc Af Amer: >60 mL/min (ref >60)
GFR calc non Af Amer: >60 mL/min (ref >60)
GLUCOSE: 127 mg/dL — AB (ref 65–99)
Potassium: 3.7 mmol/L (ref 3.5–5.1)
SODIUM: 141 mmol/L (ref 135–145)
Total Bilirubin: 0.4 mg/dL (ref 0.3–1.2)
Total Protein: 7.1 g/dL (ref 6.5–8.1)

## 2015-10-28 LAB — I-STAT BETA HCG BLOOD, ED (MC, WL, AP ONLY)

## 2015-10-28 LAB — LIPASE, BLOOD: Lipase: 21 U/L (ref 11–51)

## 2015-10-28 MED ORDER — ONDANSETRON 8 MG PO TBDP: 8.0000 mg | ORAL_TABLET | Freq: Three times a day (TID) | ORAL | Status: DC | PRN

## 2015-10-28 MED ORDER — ONDANSETRON 4 MG PO TBDP
8.0000 mg | ORAL_TABLET | Freq: Once | ORAL | Status: AC
Start: 2015-10-28 — End: 2015-10-28
  Administered 2015-10-28: 8 mg via ORAL
  Filled 2015-10-28: qty 2

## 2015-10-28 NOTE — ED Notes (Signed)
Patient complains of ongoing vomiting and diarrhea intermittently x 2 weeks, seen on 6/28 for strep. Also requests STD check and HIV test. No vomiting today and none during assessment. NAD

## 2015-10-28 NOTE — Discharge Instructions (Signed)
Take zofran as prescribed as needed for nausea and vomiting. Make sure to eat healthy. Start back exercises. Follow up with your doctor.    Pelvic Pain, Female Female pelvic pain can be caused by many different things and start from a variety of places. Pelvic pain refers to pain that is located in the lower half of the abdomen and between your hips. The pain may occur over a short period of time (acute) or may be reoccurring (chronic). The cause of pelvic pain may be related to disorders affecting the female reproductive organs (gynecologic), but it may also be related to the bladder, kidney stones, an intestinal complication, or muscle or skeletal problems. Getting help right away for pelvic pain is important, especially if there has been severe, sharp, or a sudden onset of unusual pain. It is also important to get help right away because some types of pelvic pain ca n be life threatening.  CAUSES  Below are only some of the causes of pelvic pain. The causes of pelvic pain can be in one of several categories.   Gynecologic.  Pelvic inflammatory disease.  Sexually transmitted infection.  Ovarian cyst or a twisted ovarian ligament (ovarian torsion).  Uterine lining that grows outside the uterus (endometriosis).  Fibroids, cysts, or tumors.  Ovulation.  Pregnancy.  Pregnancy that occurs outside the uterus (ectopic pregnancy).  Miscarriage.  Labor.  Abruption of the placenta or ruptured uterus.  Infection.  Uterine infection (endometritis).  Bladder infection.  Diverticulitis.  Miscarriage related to a uterine infection (septic abortion).  Bladder.  Inflammation of the bladder (cystitis).  Kidney stone(s).  Gastrointestinal.  Constipation.  Diverticulitis.  Neurologic.  Trauma.  Feeling pelvic pain because of mental or emotional causes (psychosomatic).  Cancers of the bowel or pelvis. EVALUATION  Your caregiver will want to take a careful history of your  concerns. This includes recent changes in your health, a careful gynecologic history of your periods (menses), and a sexual history. Obtaining your family history and medical history is also important. Your caregiver may suggest a pelvic exam. A pelvic exam will help identify the location and severity of the pain. It also helps in the evaluation of which organ system may be involved. In order to identify the cause of the pelvic pain and be properly treated, your caregiver may order tests. These tests may include:   A pregnancy test.  Pelvic ultrasonography.  An X-ray exam of the abdomen.  A urinalysis or evaluation of vaginal discharge.  Blood tests. HOME CARE INSTRUCTIONS   Only take over-the-counter or prescription medicines for pain, discomfort, or fever as directed by your caregiver.   Rest as directed by your caregiver.   Eat a balanced diet.   Drink enough fluids to make your urine clear or pale yellow, or as directed.   Avoid sexual intercourse if it causes pain.   Apply warm or cold compresses to the lower abdomen depending on which one helps the pain.   Avoid stressful situations.   Keep a journal of your pelvic pain. Write down when it started, where the pain is located, and if there are things that seem to be associated with the pain, such as food or your menstrual cycle.  Follow up with your caregiver as directed.  SEEK MEDICAL CARE IF:  Your medicine does not help your pain.  You have abnormal vaginal discharge. SEEK IMMEDIATE MEDICAL CARE IF:   You have heavy bleeding from the vagina.   Your pelvic pain increases.  You feel light-headed or faint.   You have chills.   You have pain with urination or blood in your urine.   You have uncontrolled diarrhea or vomiting.   You have a fever or persistent symptoms for more than 3 days.  You have a fever and your symptoms suddenly get worse.   You are being physically or sexually abused.     This information is not intended to replace advice given to you by your health care provider. Make sure you discuss any questions you have with your health care provider.   Document Released: 03/10/2004 Document Revised: 01/02/2015 Document Reviewed: 08/03/2011 Elsevier Interactive Patient Education 2016 [employer]  Back Exercises The following exercises strengthen the muscles that help to support the back. They also help to keep the lower back flexible. Doing these exercises can help to prevent back pain or lessen existing pain. If you have back pain or discomfort, try doing these exercises 2-3 times each day or as told by your health care provider. When the pain goes away, do them once each day, but increase the number of times that you repeat the steps for each exercise (do more repetitions). If you do not have back pain or discomfort, do these exercises once each day or as told by your health care provider. EXERCISES Single Knee to Chest Repeat these steps 3-5 times for each leg:  Lie on your back on a firm bed or the floor with your legs extended.  Bring one knee to your chest. Your other leg should stay extended and in contact with the floor.  Hold your knee in place by grabbing your knee or thigh.  Pull on your knee until you feel a gentle stretch in your lower back.  Hold the stretch for 10-30 seconds.  Slowly release and straighten your leg. Pelvic Tilt Repeat these steps 5-10 times:  Lie on your back on a firm bed or the floor with your legs extended.  Bend your knees so they are pointing toward the ceiling and your feet are flat on the floor.  Tighten your lower abdominal muscles to press your lower back against the floor. This motion will tilt your pelvis so your tailbone points up toward the ceiling instead of pointing to your feet or the floor.  With gentle tension and even breathing, hold this position for 5-10 seconds. Cat-Cow Repeat these steps until your  lower back becomes more flexible:  Get into a hands-and-knees position on a firm surface. Keep your hands under your shoulders, and keep your knees under your hips. You may place padding under your knees for comfort.  Let your head hang down, and point your tailbone toward the floor so your lower back becomes rounded like the back of a cat.  Hold this position for 5 seconds.  Slowly lift your head and point your tailbone up toward the ceiling so your back forms a sagging arch like the back of a cow.  Hold this position for 5 seconds. Press-Ups Repeat these steps 5-10 times:  Lie on your abdomen (face-down) on the floor.  Place your palms near your head, about shoulder-width apart.  While you keep your back as relaxed as possible and keep your hips on the floor, slowly straighten your arms to raise the top half of your body and lift your shoulders. Do not use your back muscles to raise your upper torso. You may adjust the placement of your hands to make yourself more comfortable.  Hold this  position for 5 seconds while you keep your back relaxed.  Slowly return to lying flat on the floor. Bridges Repeat these steps 10 times:  Lie on your back on a firm surface.  Bend your knees so they are pointing toward the ceiling and your feet are flat on the floor.  Tighten your buttocks muscles and lift your buttocks off of the floor until your waist is at almost the same height as your knees. You should feel the muscles working in your buttocks and the back of your thighs. If you do not feel these muscles, slide your feet 1-2 inches farther away from your buttocks.  Hold this position for 3-5 seconds.  Slowly lower your hips to the starting position, and allow your buttocks muscles to relax completely. If this exercise is too easy, try doing it with your arms crossed over your chest. Abdominal Crunches Repeat these steps 5-10 times: 1. Lie on your back on a firm bed or the floor with your  legs extended. 2. Bend your knees so they are pointing toward the ceiling and your feet are flat on the floor. 3. Cross your arms over your chest. 4. Tip your chin slightly toward your chest without bending your neck. 5. Tighten your abdominal muscles and slowly raise your trunk (torso) high enough to lift your shoulder blades a tiny bit off of the floor. Avoid raising your torso higher than that, because it can put too much stress on your low back and it does not help to strengthen your abdominal muscles. 6. Slowly return to your starting position. Back Lifts Repeat these steps 5-10 times: 1. Lie on your abdomen (face-down) with your arms at your sides, and rest your forehead on the floor. 2. Tighten the muscles in your legs and your buttocks. 3. Slowly lift your chest off of the floor while you keep your hips pressed to the floor. Keep the back of your head in line with the curve in your back. Your eyes should be looking at the floor. 4. Hold this position for 3-5 seconds. 5. Slowly return to your starting position. SEEK MEDICAL CARE IF:  Your back pain or discomfort gets much worse when you do an exercise.  Your back pain or discomfort does not lessen within 2 hours after you exercise. If you have any of these problems, stop doing these exercises right away. Do not do them again unless your health care provider says that you can. SEEK IMMEDIATE MEDICAL CARE IF:  You develop sudden, severe back pain. If this happens, stop doing the exercises right away. Do not do them again unless your health care provider says that you can.   This information is not intended to replace advice given to you by your health care provider. Make sure you discuss any questions you have with your health care provider.   Document Released: 05/21/2004 Document Revised: 01/02/2015 Document Reviewed: 06/07/2014 Elsevier Interactive Patient Education 2016 [place] [employer] Calories  are energy you get from the things you eat and drink. Your body uses this energy to keep you going throughout the day. The number of calories you eat affects your weight. When you eat more calories than your body needs, your body stores the extra calories as fat. When you eat fewer calories than your body needs, your body burns fat to get the energy it needs. Calorie counting means keeping track of how many calories you eat and drink each day. If you  make sure to eat fewer calories than your body needs, you should lose weight. In order for calorie counting to work, you will need to eat the number of calories that are right for you in a day to lose a healthy amount of weight per week. A healthy amount of weight to lose per week is usually 1-2 lb (0.5-0.9 kg). A dietitian can determine how many calories you need in a day and give you suggestions on how to reach your calorie goal.  WHAT IS MY MY PLAN? My goal is to have __________ calories per day.  If I have this many calories per day, I should lose around __________ pounds per week. WHAT DO I NEED TO KNOW ABOUT CALORIE COUNTING? In order to meet your daily calorie goal, you will need to:  Find out how many calories are in each food you would like to eat. Try to do this before you eat.  Decide how much of the food you can eat.  Write down what you ate and how many calories it had. Doing this is called keeping a food log. WHERE DO I FIND CALORIE INFORMATION? The number of calories in a food can be found on a Nutrition Facts label. Note that all the information on a label is based on a specific serving of the food. If a food does not have a Nutrition Facts label, try to look up the calories online or ask your dietitian for help. HOW DO I DECIDE HOW MUCH TO EAT? To decide how much of the food you can eat, you will need to consider both the number of calories in one serving and the size of one serving. This information can be found on the Nutrition Facts  label. If a food does not have a Nutrition Facts label, look up the information online or ask your dietitian for help. Remember that calories are listed per serving. If you choose to have more than one serving of a food, you will have to multiply the calories per serving by the amount of servings you plan to eat. For example, the label on a package of bread might say that a serving size is 1 slice and that there are 90 calories in a serving. If you eat 1 slice, you will have eaten 90 calories. If you eat 2 slices, you will have eaten 180 calories. HOW DO I KEEP A FOOD LOG? After each meal, record the following information in your food log:  What you ate.  How much of it you ate.  How many calories it had.  Then, add up your calories. Keep your food log near you, such as in a small notebook in your pocket. Another option is to use a mobile app or website. Some programs will calculate calories for you and show you how many calories you have left each time you add an item to the log. WHAT ARE SOME CALORIE COUNTING TIPS?  Use your calories on foods and drinks that will fill you up and not leave you hungry. Some examples of this include foods like nuts and nut butters, vegetables, lean proteins, and high-fiber foods (more than 5 g fiber per serving).  Eat nutritious foods and avoid empty calories. Empty calories are calories you get from foods or beverages that do not have many nutrients, such as candy and soda. It is better to have a nutritious high-calorie food (such as an avocado) than a food with few nutrients (such as a bag of chips).  Know how many calories are in the foods you eat most often. This way, you do not have to look up how many calories they have each time you eat them.  Look out for foods that may seem like low-calorie foods but are really high-calorie foods, such as baked goods, soda, and fat-free candy.  Pay attention to calories in drinks. Drinks such as sodas, specialty coffee  drinks, alcohol, and juices have a lot of calories yet do not fill you up. Choose low-calorie drinks like water and diet drinks.  Focus your calorie counting efforts on higher calorie items. Logging the calories in a garden salad that contains only vegetables is less important than calculating the calories in a milk shake.  Find a way of tracking calories that works for you. Get creative. Most people who are successful find ways to keep track of how much they eat in a day, even if they do not count every calorie. WHAT ARE SOME PORTION CONTROL TIPS?  Know how many calories are in a serving. This will help you know how many servings of a certain food you can have.  Use a measuring cup to measure serving sizes. This is helpful when you start out. With time, you will be able to estimate serving sizes for some foods.  Take some time to put servings of different foods on your favorite plates, bowls, and cups so you know what a serving looks like.  Try not to eat straight from a bag or box. Doing this can lead to overeating. Put the amount you would like to eat in a cup or on a plate to make sure you are eating the right portion.  Use smaller plates, glasses, and bowls to prevent overeating. This is a quick and easy way to practice portion control. If your plate is smaller, less food can fit on it.  Try not to multitask while eating, such as watching TV or using your computer. If it is time to eat, sit down at a table and enjoy your food. Doing this will help you to start recognizing when you are full. It will also make you more aware of what and how much you are eating. HOW CAN I CALORIE COUNT WHEN EATING OUT?  Ask for smaller portion sizes or child-sized portions.  Consider sharing an entree and sides instead of getting your own entree.  If you get your own entree, eat only half. Ask for a box at the beginning of your meal and put the rest of your entree in it so you are not tempted to eat  it.  Look for the calories on the menu. If calories are listed, choose the lower calorie options.  Choose dishes that include vegetables, fruits, whole grains, low-fat dairy products, and lean protein. Focusing on smart food choices from each of the 5 food groups can help you stay on track at restaurants.  Choose items that are boiled, broiled, grilled, or steamed.  Choose water, milk, unsweetened iced tea, or other drinks without added sugars. If you want an alcoholic beverage, choose a lower calorie option. For example, a regular margarita can have up to 700 calories and a glass of wine has around 150.  Stay away from items that are buttered, battered, fried, or served with cream sauce. Items labeled "crispy" are usually fried, unless stated otherwise.  Ask for dressings, sauces, and syrups on the side. These are usually very high in calories, so do not eat much of them.  Watch  out for salads. Many people think salads are a healthy option, but this is often not the case. Many salads come with bacon, fried chicken, lots of cheese, fried chips, and dressing. All of these items have a lot of calories. If you want a salad, choose a garden salad and ask for grilled meats or steak. Ask for the dressing on the side, or ask for olive oil and vinegar or lemon to use as dressing.  Estimate how many servings of a food you are given. For example, a serving of cooked rice is  cup or about the size of half a tennis ball or one cupcake wrapper. Knowing serving sizes will help you be aware of how much food you are eating at restaurants. The list below tells you how big or small some common portion sizes are based on everyday objects.  1 oz--4 stacked dice.  3 oz--1 deck of cards.  1 tsp--1 dice.  1 Tbsp-- a Ping-Pong ball.  2 Tbsp--1 Ping-Pong ball.   cup--1 tennis ball or 1 cupcake wrapper.  1 cup--1 baseball.   This information is not intended to replace advice given to you by your health care  provider. Make sure you discuss any questions you have with your health care provider.   Document Released: 04/13/2005 Document Revised: 05/04/2014 Document Reviewed: 02/16/2013 Elsevier Interactive Patient Education [employer].

## 2015-10-28 NOTE — ED Notes (Signed)
Given food with EDP approval.

## 2015-10-28 NOTE — ED Provider Notes (Signed)
CSN: [id number]     Arrival date & time 10/28/15  1023 History   First MD Initiated Contact with Patient 10/28/15 1254     Chief Complaint  Patient presents with  . Emesis  . Diarrhea     (Consider location/radiation/quality/duration/timing/severity/associated sxs/prior Treatment) HPI [name] is a 25 y.o. female who presents to emergency department complaining of lower abdominal pain for 6 months. Patient states that pain is mainly to the pelvic area and goes from one side to another. Denies any vaginal discharge. No urinary symptoms. Reports nausea and vomiting. Patient has been seen in emergency department for the same in the last month. In fact she has had 7 visits to emergency department in the last 2 months for various complaints. For this particular pain, patient has been evaluated with blood work, urinalysis, pelvic exam, STD check, CT scan of abdomen and pelvis with contrast, ultrasound pelvis. All these tests have not showed any abnormalities. Patient has appointment with her primary care doctor, OB/GYN, and stomach specialist in the next few weeks. She states she is back in emergency department because pain is still there and because she is throwing up. She states that she is throwing up every day, and right after telling me this she asked if we have a sandwich because she is hungry. Patient also states "I just don't get it, keep coming here nothing is being done."  Past Medical History  Diagnosis Date  . Anxiety   . Mental disorder   . Depression   . Cognitive deficits   . Bipolar 1 disorder ([place])   . Diabetes mellitus without complication ([place])   . Obesity    Past Surgical History  Procedure Laterality Date  . Cesarean section    . Tonsillectomy N/A 06/03/2012    Procedure: TONSILLECTOMY;  Surgeon: [name], MD;  Location: [facility];  Service: ENT;  Laterality: N/A;  . Mass excision N/A 06/03/2012    Procedure: EXCISION MASS;  Surgeon: [name], MD;  Location: [facility];  Service: ENT;  Laterality: N/A;  Excision uvula mass  . Cesarean section N/A 04/25/2013    Procedure: REPEAT CESAREAN SECTION;  Surgeon: [name], MD;  Location: [facility];  Service: Obstetrics;  Laterality: N/A;  . Tonsillectomy     Family History  Problem Relation Age of Onset  . Hypertension Mother   . Diabetes Father    Social History  Substance Use Topics  . Smoking status: Current Every Day Smoker -- 1.00 packs/day for 7 years    Types: Cigarettes    Last Attempt to Quit: 06/30/2015  . Smokeless tobacco: Never Used  . Alcohol Use: No   OB History    Gravida Para Term Preterm AB TAB SAB Ectopic Multiple Living   3 3 3  0 0 0 0 0 0 3     Review of Systems  Constitutional: Negative for fever and chills.  Respiratory: Negative for cough, chest tightness and shortness of breath.   Cardiovascular: Negative for chest pain, palpitations and leg swelling.  Gastrointestinal: Positive for nausea, vomiting and abdominal pain. Negative for diarrhea.  Genitourinary: Positive for pelvic pain. Negative for dysuria, flank pain, vaginal bleeding, vaginal discharge and vaginal pain.  Musculoskeletal: Negative for myalgias, arthralgias, neck pain and neck stiffness.  Skin: Negative for rash.  Neurological: Negative for dizziness, weakness and headaches.  All other systems reviewed and are negative.     Allergies  Omnipaque and Penicillins  Home Medications  Prior to Admission medications   Medication Sig Start Date End Date Taking? Authorizing Provider  busPIRone (BUSPAR) 15 MG tablet Take 1 tablet (15 mg total) by mouth 3 (three) times daily. Patient taking differently: Take 15 mg by mouth 2 (two) times daily.  07/23/15  Yes Kerrie Buffalo, NP  DULoxetine (CYMBALTA) 60 MG capsule Take 60 mg by mouth daily. 09/25/15  Yes Historical Provider, MD  fluvoxaMINE (LUVOX) 25 MG tablet Take 3 tablets (75 mg total) by mouth daily with  breakfast. Patient taking differently: Take 100 mg by mouth daily with breakfast.  07/23/15  Yes Kerrie Buffalo, NP  glipiZIDE (GLUCOTROL XL) 5 MG 24 hr tablet Take 1 tablet (5 mg total) by mouth daily with breakfast. Patient taking differently: Take 10 mg by mouth daily with breakfast.  07/23/15  Yes Kerrie Buffalo, NP  meloxicam (MOBIC) 7.5 MG tablet Take 1 tablet (7.5 mg total) by mouth daily. 99991111  Yes Delora Fuel, MD  metFORMIN (GLUCOPHAGE) 500 MG tablet Take 1 tablet (500 mg total) by mouth 2 (two) times daily. For diabetes management 07/23/15  Yes Kerrie Buffalo, NP  oxyCODONE-acetaminophen (PERCOCET) 5-325 MG tablet Take 1 tablet by mouth every 4 (four) hours as needed for moderate pain. 99991111  Yes Delora Fuel, MD  azithromycin (ZITHROMAX) 250 MG tablet Take 1 tablet (250 mg total) by mouth daily. Take first 2 tablets together, then 1 every day until finished. Patient not taking: Reported on 10/28/2015 10/23/15   Dorie Rank, MD  cetirizine (ZYRTEC) 10 MG tablet Take 1 tablet (10 mg total) by mouth daily. For allergies Patient not taking: Reported on 10/23/2015 07/23/15   Kerrie Buffalo, NP  simvastatin (ZOCOR) 20 MG tablet Take 1 tablet (20 mg total) by mouth daily at 6 PM. Patient not taking: Reported on 10/23/2015 07/23/15   Kerrie Buffalo, NP   BP 147/112 mmHg  Pulse 109  Temp(Src) 98.7 F (37.1 C) (Oral)  Resp 17  Ht 5\' 7"  (1.702 m)  Wt 126.1 kg  BMI 43.53 kg/m2  SpO2 99% Physical Exam  Constitutional: She appears well-developed and well-nourished. No distress.  HENT:  Head: Normocephalic.  Eyes: Conjunctivae are normal.  Neck: Neck supple.  Cardiovascular: Normal rate, regular rhythm and normal heart sounds.   Pulmonary/Chest: Effort normal and breath sounds normal. No respiratory distress. She has no wheezes. She has no rales.  Abdominal: Soft. Bowel sounds are normal. She exhibits no distension. There is tenderness. There is no rebound.  Right abdominal tenderness. No  guarding.  Musculoskeletal: She exhibits no edema.  Neurological: She is alert.  Skin: Skin is warm and dry.  Psychiatric: She has a normal mood and affect. Her behavior is normal.  Nursing note and vitals reviewed.   ED Course  Procedures (including critical care time) Labs Review Labs Reviewed  COMPREHENSIVE METABOLIC PANEL - Abnormal; Notable for the following:    Glucose, Bld 127 (*)    All other components within normal limits  LIPASE, BLOOD  CBC  I-STAT BETA HCG BLOOD, ED ([facility], [facility], [facility] ONLY)    Imaging Review No results found. I have personally reviewed and evaluated these images and lab results as part of my medical decision-making.   EKG Interpretation None      MDM   Final diagnoses:  Right lower quadrant abdominal pain    Patient emergency department with lower abdominal pain. She has had several visits for the same pain, and states pain has been there for 6 months. She has had negative  blood work, negative urinalysis, negative tests for gonorrhea, Chlamydia, syphilis, HIV. She had negative ultrasound which showed no fibroids or ovarian cysts. She also had a CT scan that showed possible endometriosis around her C-section incision, otherwise no findings. I have set and reviewed all these tests with patient. She voiced understanding but was asking what is next. We discussed her upcoming appointments with primary care doctor, "stomach specialist", OB/GYN. For nausea and vomiting I'll prescribe her Zofran. We also discussed weight loss and the fact that her pain may be radiating from lower back issues. We discussed diet and starting to exercise and do back exercises and walking. Patient agreed to try and fix her diet and exercise more. As soon as we were done talking, patient asked for a sandwich and cheese and crackers. She was able to eat this sandwich and cheese and crackers were no difficulty. She will be discharged home with Zofran and outpatient follow-up.  Filed  Vitals:   10/28/15 1051  BP: 147/112  Pulse: 109  Temp: 98.7 F (37.1 C)  TempSrc: Oral  Resp: 17  Height: 5\' 7"  (1.702 m)  Weight: 126.1 kg  SpO2: 99%   Patient walked out of the department after eating her sandwich and without informing staff. She did not receive her prescription for nausea medication    Jeannett Senior, PA-C 10/28/15 [place], MD 10/28/15 1724

## 2015-10-28 NOTE — ED Notes (Signed)
EDP at bedside  

## 2015-11-20 ENCOUNTER — Encounter (HOSPITAL_COMMUNITY): Payer: Self-pay | Admitting: Emergency Medicine

## 2015-11-20 ENCOUNTER — Emergency Department (HOSPITAL_COMMUNITY)
Admission: EM | Admit: 2015-11-20 | Discharge: 2015-11-21 | Disposition: A | Discharge status: Home / Self Care | Payer: Medicaid Other | Attending: Emergency Medicine | Admitting: Emergency Medicine

## 2015-11-20 DIAGNOSIS — F17210 Nicotine dependence, cigarettes, uncomplicated: Secondary | ICD-10-CM | POA: Diagnosis not present

## 2015-11-20 DIAGNOSIS — Z7984 Long term (current) use of oral hypoglycemic drugs: Secondary | ICD-10-CM | POA: Diagnosis not present

## 2015-11-20 DIAGNOSIS — Y939 Activity, unspecified: Secondary | ICD-10-CM | POA: Insufficient documentation

## 2015-11-20 DIAGNOSIS — W57XXXA Bitten or stung by nonvenomous insect and other nonvenomous arthropods, initial encounter: Secondary | ICD-10-CM | POA: Diagnosis not present

## 2015-11-20 DIAGNOSIS — F319 Bipolar disorder, unspecified: Secondary | ICD-10-CM | POA: Insufficient documentation

## 2015-11-20 DIAGNOSIS — Y929 Unspecified place or not applicable: Secondary | ICD-10-CM | POA: Diagnosis not present

## 2015-11-20 DIAGNOSIS — S60561A Insect bite (nonvenomous) of right hand, initial encounter: Secondary | ICD-10-CM | POA: Insufficient documentation

## 2015-11-20 DIAGNOSIS — Y999 Unspecified external cause status: Secondary | ICD-10-CM | POA: Diagnosis not present

## 2015-11-20 DIAGNOSIS — R102 Pelvic and perineal pain: Secondary | ICD-10-CM

## 2015-11-20 DIAGNOSIS — S60562A Insect bite (nonvenomous) of left hand, initial encounter: Secondary | ICD-10-CM | POA: Diagnosis not present

## 2015-11-20 DIAGNOSIS — S30861A Insect bite (nonvenomous) of abdominal wall, initial encounter: Secondary | ICD-10-CM | POA: Diagnosis present

## 2015-11-20 DIAGNOSIS — L299 Pruritus, unspecified: Secondary | ICD-10-CM

## 2015-11-20 DIAGNOSIS — E119 Type 2 diabetes mellitus without complications: Secondary | ICD-10-CM | POA: Insufficient documentation

## 2015-11-20 NOTE — ED Triage Notes (Addendum)
Pt in EMS reporting pelvic pain/lower abd pain X7 months. [facility] next month. Also reports itching to abd, thinks she might have bed bugs at her house. Rash noted. CBG 131, A/OX4. VSS

## 2015-11-21 LAB — URINE MICROSCOPIC-ADD ON

## 2015-11-21 LAB — URINALYSIS, ROUTINE W REFLEX MICROSCOPIC
BILIRUBIN URINE: NEGATIVE
Glucose, UA: NEGATIVE mg/dL
KETONES UR: NEGATIVE mg/dL
Leukocytes, UA: NEGATIVE
NITRITE: NEGATIVE
PROTEIN: NEGATIVE mg/dL
Specific Gravity, Urine: 1.029 (ref 1.005–1.030)
pH: 6 (ref 5.0–8.0)

## 2015-11-21 LAB — PREGNANCY, URINE: PREG TEST UR: NEGATIVE

## 2015-11-21 MED ORDER — KETOROLAC TROMETHAMINE 15 MG/ML IJ SOLN
15.0000 mg | Freq: Once | INTRAMUSCULAR | Status: DC
  Filled 2015-11-21: qty 1

## 2015-11-21 MED ORDER — DIPHENHYDRAMINE HCL 25 MG PO CAPS: 25.0000 mg | ORAL_CAPSULE | Freq: Four times a day (QID) | ORAL | 0 refills | Status: DC | PRN

## 2015-11-21 MED ORDER — DIPHENHYDRAMINE HCL 25 MG PO CAPS
25.0000 mg | ORAL_CAPSULE | Freq: Once | ORAL | Status: AC
  Administered 2015-11-21: 25 mg via ORAL
  Filled 2015-11-21: qty 1

## 2015-11-21 NOTE — ED Provider Notes (Signed)
[facility] DEPT Provider Note   CSN: [id number] Arrival date & time: 11/20/15  2312  First Provider Contact:  12:40 AM   By signing my name below, I, [name], attest that this documentation has been prepared under the direction and in the presence of Stacy Hacker, MD. Electronically Signed: Reola [name], ED Scribe. 11/21/15. 12:51 AM.  History   Chief Complaint Chief Complaint  Patient presents with  . Abdominal Pain  . Pelvic Pain   The history is provided by the patient. No language interpreter was used.    HPI Comments: [name] is a 25 y.o. female with a PMHx of DM, HLD, anxiety, bipolar 1 disorder, and obesity, who presents to the Emergency Department complaining of gradual onset, constant, multiple, pruritic bug bites to her lower right sided abdomen. She reports that she was recently exposed to bed bugs through her friend. No OTC medications or home remedies tried PTA. Pt states that she has been washing her clothes and sheets to try to get rid of the bugs, but an exterminator has not been called into her home. Denies nausea, vomiting, or any other symptoms.   Pt also c/o six month hx of abdominal pain, evaulated multiple times in the ED previously, and is unchanged from prior hx of pain. She is scheduled for an appointment regarding her pain with [facility] in ~6 days.    Past Medical History:  Diagnosis Date  . Anxiety   . Bipolar 1 disorder ([place])   . Cognitive deficits   . Depression   . Diabetes mellitus without complication ([place])   . Mental disorder   . Obesity     Patient Active Problem List   Diagnosis Date Noted  . 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 Learning disability 07/18/2015  . 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 Diabetes mellitus ([place]) 07/18/2015  . 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 Hyperlipidemia 07/18/2015  . Drug overdose   . Cognitive  deficits 10/12/2012  . Generalized anxiety disorder 06/28/2012    Past Surgical History:  Procedure Laterality Date  . CESAREAN SECTION    . CESAREAN SECTION N/A 04/25/2013   Procedure: REPEAT CESAREAN SECTION;  Surgeon: [name], MD;  Location: [facility];  Service: Obstetrics;  Laterality: N/A;  . MASS EXCISION N/A 06/03/2012   Procedure: EXCISION MASS;  Surgeon: [name], MD;  Location: [facility];  Service: ENT;  Laterality: N/A;  Excision uvula mass  . TONSILLECTOMY N/A 06/03/2012   Procedure: TONSILLECTOMY;  Surgeon: [name], MD;  Location: [facility];  Service: ENT;  Laterality: N/A;  . TONSILLECTOMY      OB History    Gravida Para Term Preterm AB Living   3 3 3  0 0 3   SAB TAB Ectopic Multiple Live Births   0 0 0 0         Home Medications    Prior to Admission medications   Medication Sig Start Date End Date Taking? Authorizing Provider  azithromycin (ZITHROMAX) 250 MG tablet Take 1 tablet (250 mg total) by mouth daily. Take first 2 tablets together, then 1 every day until finished. Patient not taking: Reported on 10/28/2015 10/23/15   Dorie Rank, MD  busPIRone (BUSPAR) 15 MG tablet Take 1 tablet (15 mg total) by mouth 3 (three) times daily. Patient taking differently: Take 15 mg by mouth 2 (two) times daily.  07/23/15   Stacy Buffalo, NP  cetirizine (ZYRTEC) 10 MG tablet Take 1 tablet (10 mg total) by mouth daily. For allergies Patient not taking: Reported on 10/23/2015 07/23/15   Stacy Buffalo, NP  diphenhydrAMINE (BENADRYL) 25 mg capsule Take 1 capsule (25 mg total) by mouth every 6 (six) hours as needed. 11/21/15   Stacy Hacker, MD  DULoxetine (CYMBALTA) 60 MG capsule Take 60 mg by mouth daily. 09/25/15   Historical Provider, MD  fluvoxaMINE (LUVOX) 25 MG tablet Take 3 tablets (75 mg total) by mouth daily with breakfast. Patient taking differently: Take 100 mg by mouth daily with breakfast.  07/23/15   Stacy Buffalo, NP    glipiZIDE (GLUCOTROL XL) 5 MG 24 hr tablet Take 1 tablet (5 mg total) by mouth daily with breakfast. Patient taking differently: Take 10 mg by mouth daily with breakfast.  07/23/15   Stacy Buffalo, NP  meloxicam (MOBIC) 7.5 MG tablet Take 1 tablet (7.5 mg total) by mouth daily. 99991111   Stacy Fuel, MD  metFORMIN (GLUCOPHAGE) 500 MG tablet Take 1 tablet (500 mg total) by mouth 2 (two) times daily. For diabetes management 07/23/15   Stacy Buffalo, NP  ondansetron (ZOFRAN ODT) 8 MG disintegrating tablet Take 1 tablet (8 mg total) by mouth every 8 (eight) hours as needed for nausea or vomiting. 10/28/15   Stacy Kirichenko, PA-C  oxyCODONE-acetaminophen (PERCOCET) 5-325 MG tablet Take 1 tablet by mouth every 4 (four) hours as needed for moderate pain. 99991111   Stacy Fuel, MD  simvastatin (ZOCOR) 20 MG tablet Take 1 tablet (20 mg total) by mouth daily at 6 PM. Patient not taking: Reported on 10/23/2015 07/23/15   Stacy Buffalo, NP    Family History Family History  Problem Relation Age of Onset  . Hypertension Mother   . Diabetes Father     Social History Social History  Substance Use Topics  . Smoking status: Current Every Day Smoker    Packs/day: 1.00    Years: 7.00    Types: Cigarettes    Last attempt to quit: 06/30/2015  . Smokeless tobacco: Never Used  . Alcohol use No     Allergies   Omnipaque [iohexol] and Penicillins   Review of Systems Review of Systems  Constitutional: Negative for fever.  Gastrointestinal: Positive for abdominal pain. Negative for nausea and vomiting.  Genitourinary: Negative for dysuria.  Skin: Positive for rash.  All other systems reviewed and are negative.  Physical Exam Updated Vital Signs BP 131/86 (BP Location: Right Arm)   Pulse 94   Resp 16   SpO2 99%   Physical Exam  Constitutional: She is oriented to person, place, and time. She appears well-developed and well-nourished.  Obese  HENT:  Head: Normocephalic and atraumatic.   Cardiovascular: Normal rate, regular rhythm and normal heart sounds.   Pulmonary/Chest: Effort normal and breath sounds normal. No respiratory distress. She has no wheezes.  Abdominal: Soft. Bowel sounds are normal. There is no tenderness. There is no guarding.   Bites noted over the abdomen with mild erythema and excoriation  Neurological: She is alert and oriented to person, place, and time.  Skin: Skin is warm and dry.  Multiple bites noted of bilateral upper extremities  Psychiatric: She has a normal mood and affect.  Nursing note and vitals reviewed.   ED Treatments / Results  DIAGNOSTIC STUDIES: Oxygen Saturation is 99% on RA, normal by my interpretation.   COORDINATION OF CARE: 12:45 AM-Discussed next steps with pt. Pt verbalized understanding and is agreeable with the plan.  Labs (all labs ordered are listed, but only abnormal results are displayed) Labs Reviewed  URINALYSIS, ROUTINE W REFLEX MICROSCOPIC (NOT AT [facility]) - Abnormal; Notable for the following:       Result Value   Hgb urine dipstick SMALL (*)    All other components within normal limits  URINE MICROSCOPIC-ADD ON - Abnormal; Notable for the following:    Squamous Epithelial / LPF 0-5 (*)    Bacteria, UA RARE (*)    Crystals CA OXALATE CRYSTALS (*)    All other components within normal limits  PREGNANCY, URINE    EKG  EKG Interpretation None       Radiology No results found.  Procedures Procedures (including critical care time)  Medications Ordered in ED Medications  ketorolac (TORADOL) 15 MG/ML injection 15 mg (15 mg Intramuscular Not Given 11/21/15 0131)  diphenhydrAMINE (BENADRYL) capsule 25 mg (25 mg Oral Given 11/21/15 0126)     Initial Impression / Assessment and Plan / ED Course  I have reviewed the triage vital signs and the nursing notes.  Pertinent labs & imaging results that were available during my care of the patient were reviewed by me and considered in my medical decision  making (see chart for details).  Clinical Course    Patient presents with itching and concern for bedbugs. She is nontoxic-appearing. Vital signs are reassuring. She also reports chronic abdominal pain. This is unchanged. When asked why she came in tonight she states it was because of the itching. Urinalysis and urine pregnancy negative. She does appear to have bites and excoriations associated with that. Patient was given Benadryl. I discussed at length with the patient that she needs to have a full evaluation of furniture and mattress. She also needs to wash her clothes and bedding in hot water. Regarding her abdominal pain, [facility]  After history, exam, and medical workup I feel the patient has been appropriately medically screened and is safe for discharge home. Pertinent diagnoses were discussed with the patient. Patient was given return precautions.   Final Clinical Impressions(s) / ED Diagnoses   Final diagnoses:  Pelvic pain in female  Itching    New Prescriptions Discharge Medication List as of 11/21/2015  2:57 AM    START taking these medications   Details  diphenhydrAMINE (BENADRYL) 25 mg capsule Take 1 capsule (25 mg total) by mouth every 6 (six) hours as needed., Starting Thu 11/21/2015, Print       I personally performed the services described in this documentation, which was scribed in my presence. The recorded information has been reviewed and is accurate.     Stacy Hacker, MD 11/21/15 [phone]

## 2015-11-21 NOTE — Discharge Instructions (Signed)
You were seen today for itching. There is no obvious rash. If think you have bedbugs, you need to wash all clothing and bedding in hot water. You also need to inspect her mattresses. Regarding her abdominal pain, this is chronic in nature. Take naproxen twice daily for pain. Follow[facility] in [facility] as scheduled.

## 2015-11-21 NOTE — ED Notes (Signed)
Bus pass provided.

## 2015-11-27 ENCOUNTER — Ambulatory Visit: Payer: Self-pay | Admitting: Obstetrics & Gynecology

## 2015-11-29 ENCOUNTER — Encounter (HOSPITAL_COMMUNITY): Payer: Self-pay | Admitting: Emergency Medicine

## 2015-11-29 ENCOUNTER — Emergency Department (HOSPITAL_COMMUNITY)
Admission: EM | Admit: 2015-11-29 | Discharge: 2015-11-29 | Disposition: A | Discharge status: Home / Self Care | Payer: Medicaid Other | Attending: Emergency Medicine | Admitting: Emergency Medicine

## 2015-11-29 DIAGNOSIS — F419 Anxiety disorder, unspecified: Secondary | ICD-10-CM | POA: Diagnosis not present

## 2015-11-29 DIAGNOSIS — F17210 Nicotine dependence, cigarettes, uncomplicated: Secondary | ICD-10-CM | POA: Insufficient documentation

## 2015-11-29 DIAGNOSIS — Z7984 Long term (current) use of oral hypoglycemic drugs: Secondary | ICD-10-CM | POA: Insufficient documentation

## 2015-11-29 DIAGNOSIS — R440 Auditory hallucinations: Secondary | ICD-10-CM | POA: Insufficient documentation

## 2015-11-29 DIAGNOSIS — I10 Essential (primary) hypertension: Secondary | ICD-10-CM | POA: Insufficient documentation

## 2015-11-29 DIAGNOSIS — Z0000 Encounter for general adult medical examination without abnormal findings: Secondary | ICD-10-CM | POA: Diagnosis present

## 2015-11-29 LAB — COMPREHENSIVE METABOLIC PANEL
ALBUMIN: 4.6 g/dL (ref 3.5–5.0)
ALT: 33 U/L (ref 14–54)
AST: 32 U/L (ref 15–41)
Alkaline Phosphatase: 51 U/L (ref 38–126)
Anion gap: 9 (ref 5–15)
BILIRUBIN TOTAL: 0.8 mg/dL (ref 0.3–1.2)
BUN: 10 mg/dL (ref 6–20)
CO2: 25 mmol/L (ref 22–32)
Calcium: 9.1 mg/dL (ref 8.9–10.3)
Chloride: 105 mmol/L (ref 101–111)
Creatinine, Ser: 0.82 mg/dL (ref 0.44–1.00)
GFR calc Af Amer: >60 mL/min (ref >60)
GFR calc non Af Amer: >60 mL/min (ref >60)
GLUCOSE: 89 mg/dL (ref 65–99)
POTASSIUM: 3.4 mmol/L — AB (ref 3.5–5.1)
SODIUM: 139 mmol/L (ref 135–145)
TOTAL PROTEIN: 8.1 g/dL (ref 6.5–8.1)

## 2015-11-29 LAB — ACETAMINOPHEN LEVEL

## 2015-11-29 LAB — CBC
HCT: 40.3 % (ref 36.0–46.0)
Hemoglobin: 13 g/dL (ref 12.0–15.0)
MCH: 25.9 pg — ABNORMAL LOW (ref 26.0–34.0)
MCHC: 32.3 g/dL (ref 30.0–36.0)
MCV: 80.4 fL (ref 78.0–100.0)
PLATELETS: 243 10*3/uL (ref 150–400)
RBC: 5.01 MIL/uL (ref 3.87–5.11)
RDW: 14.6 % (ref 11.5–15.5)
WBC: 10.3 10*3/uL (ref 4.0–10.5)

## 2015-11-29 LAB — RAPID URINE DRUG SCREEN, HOSP PERFORMED
Amphetamines: NOT DETECTED
BENZODIAZEPINES: NOT DETECTED
Barbiturates: NOT DETECTED
COCAINE: NOT DETECTED
OPIATES: NOT DETECTED
Tetrahydrocannabinol: NOT DETECTED

## 2015-11-29 LAB — SALICYLATE LEVEL

## 2015-11-29 LAB — HCG, QUANTITATIVE, PREGNANCY

## 2015-11-29 LAB — ETHANOL

## 2015-11-29 NOTE — ED Triage Notes (Addendum)
Pt here via EMS. Pt called initially for CP. Pt denied pain when EMS was on scene. Pt states she is hearing voices that are interfering with her sleep. Pt denies SI and HI. Pt is yelling excessively during assessment about being tired and wanting to lay down. Pt denies SI and HI  Pt states she hasn't had her invega shot in 2.5 months. Pt states she is also feeling depressed.

## 2015-11-29 NOTE — ED Notes (Signed)
Pt stable and ambulatory at time of dc. Pt verbalized that her anxiety has improved.

## 2015-11-29 NOTE — ED Provider Notes (Signed)
[facility] DEPT Provider Note   CSN: [id number] Arrival date & time: 11/29/15  1850  First Provider Contact:  None       History   Chief Complaint Chief Complaint  Patient presents with  . Medical Clearance  . Hallucinations    HPI [name] is a 25 y.o. female.  80 showed female with past medical history including bipolar disorder, type 2 diabetes mellitus who presents with hallucinations. Patient was brought in by EMS, who she called initially for chest pain, however when they arrived on scene and she denied any pain and stated that she was hearing voices that were interfering with her sleep. Patient initially complained of being tired. During my examination, the patient stated that she occasionally has auditory hallucinations but she denies any voices telling her to do anything. She states that she does have depression but denies any SI or HI. She notes that she has not had her Invega shot in 2.5 months but is scheduled to get the shot and the oral medication in 3 days. She states she has been taking her other medications. She denies any other complaints.   The history is provided by the patient.    Past Medical History:  Diagnosis Date  . Anxiety   . Bipolar 1 disorder ([place])   . Cognitive deficits   . Depression   . Diabetes mellitus without complication ([place])   . Mental disorder   . Obesity     Patient Active Problem List   Diagnosis Date Noted  . 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 Learning disability 07/18/2015  . 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 Diabetes mellitus ([place]) 07/18/2015  . 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 Hyperlipidemia 07/18/2015  . Drug overdose   . Cognitive deficits 10/12/2012  . Generalized anxiety disorder 06/28/2012    Past Surgical History:  Procedure Laterality Date  . CESAREAN SECTION    . CESAREAN SECTION N/A 04/25/2013   Procedure: REPEAT CESAREAN  SECTION;  Surgeon: [name], MD;  Location: [facility];  Service: Obstetrics;  Laterality: N/A;  . MASS EXCISION N/A 06/03/2012   Procedure: EXCISION MASS;  Surgeon: [name], MD;  Location: [facility];  Service: ENT;  Laterality: N/A;  Excision uvula mass  . TONSILLECTOMY N/A 06/03/2012   Procedure: TONSILLECTOMY;  Surgeon: [name], MD;  Location: [facility];  Service: ENT;  Laterality: N/A;  . TONSILLECTOMY      OB History    Gravida Para Term Preterm AB Living   3 3 3  0 0 3   SAB TAB Ectopic Multiple Live Births   0 0 0 0 3       Home Medications    Prior to Admission medications   Medication Sig Start Date End Date Taking? Authorizing Provider  azithromycin (ZITHROMAX) 250 MG tablet Take 1 tablet (250 mg total) by mouth daily. Take first 2 tablets together, then 1 every day until finished. Patient not taking: Reported on 10/28/2015 10/23/15   Dorie Rank, MD  busPIRone (BUSPAR) 15 MG tablet Take 1 tablet (15 mg total) by mouth 3 (three) times daily. Patient taking differently: Take 15 mg by mouth 2 (two) times daily.  07/23/15   Kerrie Buffalo, NP  cetirizine (ZYRTEC) 10 MG tablet Take 1 tablet (10 mg total) by mouth daily. For allergies Patient not taking: Reported on 10/23/2015 07/23/15   Kerrie Buffalo, NP  diphenhydrAMINE (BENADRYL) 25 mg capsule Take 1 capsule (  25 mg total) by mouth every 6 (six) hours as needed. 11/21/15   Merryl Hacker, MD  DULoxetine (CYMBALTA) 60 MG capsule Take 60 mg by mouth daily. 09/25/15   Historical Provider, MD  fluvoxaMINE (LUVOX) 25 MG tablet Take 3 tablets (75 mg total) by mouth daily with breakfast. Patient taking differently: Take 100 mg by mouth daily with breakfast.  07/23/15   Kerrie Buffalo, NP  glipiZIDE (GLUCOTROL XL) 5 MG 24 hr tablet Take 1 tablet (5 mg total) by mouth daily with breakfast. Patient taking differently: Take 10 mg by mouth daily with breakfast.  07/23/15   Kerrie Buffalo, NP  meloxicam  (MOBIC) 7.5 MG tablet Take 1 tablet (7.5 mg total) by mouth daily. [id number]   Delora Fuel, MD  metFORMIN (GLUCOPHAGE) 500 MG tablet Take 1 tablet (500 mg total) by mouth 2 (two) times daily. For diabetes management 07/23/15   Kerrie Buffalo, NP  ondansetron (ZOFRAN ODT) 8 MG disintegrating tablet Take 1 tablet (8 mg total) by mouth every 8 (eight) hours as needed for nausea or vomiting. 10/28/15   Tatyana Kirichenko, PA-C  oxyCODONE-acetaminophen (PERCOCET) 5-325 MG tablet Take 1 tablet by mouth every 4 (four) hours as needed for moderate pain. [id number]   Delora Fuel, MD  simvastatin (ZOCOR) 20 MG tablet Take 1 tablet (20 mg total) by mouth daily at 6 PM. Patient not taking: Reported on 10/23/2015 07/23/15   Kerrie Buffalo, NP    Family History Family History  Problem Relation Age of Onset  . Hypertension Mother   . Diabetes Father     Social History Social History  Substance Use Topics  . Smoking status: Current Every Day Smoker    Packs/day: 1.00    Years: 7.00    Types: Cigarettes    Last attempt to quit: 06/30/2015  . Smokeless tobacco: Never Used  . Alcohol use No     Allergies   Omnipaque [iohexol] and Penicillins   Review of Systems Review of Systems 10 Systems reviewed and are negative for acute change except as noted in the HPI.  Physical Exam Updated Vital Signs BP 144/95 (BP Location: Left Arm)   Pulse 104   Temp 98.8 F (37.1 C) (Oral)   SpO2 99%   Physical Exam  Constitutional: She is oriented to person, place, and time. She appears well-developed and well-nourished. No distress.  HENT:  Head: Normocephalic and atraumatic.  Eyes: Conjunctivae are normal. Pupils are equal, round, and reactive to light.  Neck: Neck supple.  Cardiovascular: Normal rate, regular rhythm and normal heart sounds.   No murmur heard. Pulmonary/Chest: Effort normal and breath sounds normal.  Neurological: She is alert and oriented to person, place, and time.  Skin: Skin is warm and  dry.  Psychiatric:  Calm, makes appropriate eye contact, occasionally smiling, pleasant during my interaction  Nursing note and vitals reviewed.    ED Treatments / Results  Labs (all labs ordered are listed, but only abnormal results are displayed) Labs Reviewed  COMPREHENSIVE METABOLIC PANEL - Abnormal; Notable for the following:       Result Value   Potassium 3.4 (*)    All other components within normal limits  ACETAMINOPHEN LEVEL - Abnormal; Notable for the following:    Acetaminophen (Tylenol), Serum <10 (*)    All other components within normal limits  CBC - Abnormal; Notable for the following:    MCH 25.9 (*)    All other components within normal limits  ETHANOL  SALICYLATE LEVEL  URINE RAPID DRUG SCREEN, HOSP PERFORMED  HCG, QUANTITATIVE, PREGNANCY    EKG  EKG Interpretation None       Radiology No results found.  Procedures Procedures (including critical care time)  Medications Ordered in ED Medications - No data to display   Initial Impression / Assessment and Plan / ED Course  I have reviewed the triage vital signs and the nursing notes.  Pertinent labs that were available during my care of the patient were reviewed by me and considered in my medical decision making (see chart for details).  Clinical Course   Patient with psychiatric history currently on multiple medications was brought in by EMS and on arrival complained of hearing voices that interfered with her sleep. She was reportedly yelling during assessments but during my examination she stated that she felt much better as she had taken her trazodone approximately 1.5 hours prior and felt that it was helping with her nerves. She was calm and endorsed occasional hallucinations but no specific voices and multiple times denied SI or HI. She has an appointment in 3 days to restart her medications. I emphasized the importance of follow-up with her mental health team and she voiced understanding of this  plan. She does not endorse any thoughts of wanting to hurt self or others and has been calm and appropriate during my exam therefore I feel she is safe for discharge. I reviewed return precautions and she voiced understanding. Patient discharged in satisfactory condition.  Final Clinical Impressions(s) / ED Diagnoses   Final diagnoses:  Auditory hallucination  Anxiety    New Prescriptions New Prescriptions   No medications on file     Sharlett Iles, MD 11/29/15 2043

## 2015-11-30 ENCOUNTER — Emergency Department (HOSPITAL_COMMUNITY)
Admission: EM | Admit: 2015-11-30 | Discharge: 2015-11-30 | Disposition: A | Discharge status: Home / Self Care | Payer: Medicaid Other | Attending: Emergency Medicine | Admitting: Emergency Medicine

## 2015-11-30 ENCOUNTER — Encounter (HOSPITAL_COMMUNITY): Payer: Self-pay | Admitting: Emergency Medicine

## 2015-11-30 DIAGNOSIS — E119 Type 2 diabetes mellitus without complications: Secondary | ICD-10-CM | POA: Insufficient documentation

## 2015-11-30 DIAGNOSIS — L299 Pruritus, unspecified: Secondary | ICD-10-CM | POA: Diagnosis not present

## 2015-11-30 DIAGNOSIS — Z7984 Long term (current) use of oral hypoglycemic drugs: Secondary | ICD-10-CM | POA: Insufficient documentation

## 2015-11-30 DIAGNOSIS — F17210 Nicotine dependence, cigarettes, uncomplicated: Secondary | ICD-10-CM | POA: Insufficient documentation

## 2015-11-30 MED ORDER — HYDROXYZINE HCL 25 MG PO TABS
25.0000 mg | ORAL_TABLET | Freq: Once | ORAL | Status: AC
  Administered 2015-11-30: 25 mg via ORAL
  Filled 2015-11-30: qty 1

## 2015-11-30 MED ORDER — HYDROXYZINE HCL 25 MG PO TABS: 25.0000 mg | ORAL_TABLET | Freq: Four times a day (QID) | ORAL | 0 refills | Status: DC

## 2015-11-30 MED ORDER — PERMETHRIN 5 % EX CREA: TOPICAL_CREAM | CUTANEOUS | 1 refills | Status: DC

## 2015-11-30 NOTE — Discharge Instructions (Signed)
Atarax as needed for itching. Apply cream from neck down as discussed. Return to ER for new or worsening symptoms, any additional concerns.

## 2015-11-30 NOTE — ED Provider Notes (Signed)
[facility] DEPT Provider Note   CSN: [id number] Arrival date & time: 11/30/15  1117  First Provider Contact:   First MD Initiated Contact with Patient 11/30/15 1217      History   Chief Complaint Chief Complaint  Patient presents with  . Pruritis    HPI [name] is a 25 y.o. female.  The history is provided by the patient and medical records. No language interpreter was used.   [name] is a 25 y.o. female  with a PMH of bipolar disorder, DM, anxiety who presents to the Emergency Department complaining of generalized itching x 2 days that is worse at night. No medications taken prior to arrival for symptoms. Recently spent the night at a friend's "house" which is a motel for several nights just before onset of symptoms. Friend is also experiencing generalized itching and told he has bedbugs. Patient does not see any bite marks or skin changes. No fever, shortness of breath, oral/neck swelling.  Past Medical History:  Diagnosis Date  . Anxiety   . Bipolar 1 disorder ([place])   . Cognitive deficits   . Depression   . Diabetes mellitus without complication ([place])   . Mental disorder   . Obesity     Patient Active Problem List   Diagnosis Date Noted  . 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 Learning disability 07/18/2015  . 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 Diabetes mellitus ([place]) 07/18/2015  . 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 Hyperlipidemia 07/18/2015  . Drug overdose   . Cognitive deficits 10/12/2012  . Generalized anxiety disorder 06/28/2012    Past Surgical History:  Procedure Laterality Date  . CESAREAN SECTION    . CESAREAN SECTION N/A 04/25/2013   Procedure: REPEAT CESAREAN SECTION;  Surgeon: [name], MD;  Location: [facility];  Service: Obstetrics;  Laterality: N/A;  . MASS EXCISION N/A 06/03/2012   Procedure: EXCISION MASS;  Surgeon: [name], MD;  Location: [facility];  Service: ENT;  Laterality: N/A;  Excision uvula mass  . TONSILLECTOMY N/A 06/03/2012   Procedure: TONSILLECTOMY;  Surgeon: [name], MD;  Location: [facility];  Service: ENT;  Laterality: N/A;  . TONSILLECTOMY      OB History    Gravida Para Term Preterm AB Living   3 3 3  0 0 3   SAB TAB Ectopic Multiple Live Births   0 0 0 0 3       Home Medications    Prior to Admission medications   Medication Sig Start Date End Date Taking? Authorizing Provider  azithromycin (ZITHROMAX) 250 MG tablet Take 1 tablet (250 mg total) by mouth daily. Take first 2 tablets together, then 1 every day until finished. Patient not taking: Reported on 10/28/2015 10/23/15   Dorie Rank, MD  busPIRone (BUSPAR) 15 MG tablet Take 1 tablet (15 mg total) by mouth 3 (three) times daily. Patient taking differently: Take 15 mg by mouth 2 (two) times daily.  07/23/15   Kerrie Buffalo, NP  cetirizine (ZYRTEC) 10 MG tablet Take 1 tablet (10 mg total) by mouth daily. For allergies Patient not taking: Reported on 10/23/2015 07/23/15   Kerrie Buffalo, NP  diphenhydrAMINE (BENADRYL) 25 mg capsule Take 1 capsule (25 mg total) by mouth every 6 (six) hours as needed. 11/21/15   Merryl Hacker, MD  DULoxetine (CYMBALTA) 60 MG capsule Take 60 mg by mouth daily. 09/25/15   Historical Provider, MD  fluvoxaMINE (LUVOX) 25 MG tablet Take 3 tablets (75 mg total) by mouth daily with breakfast. Patient taking differently: Take 100 mg by mouth daily with breakfast.  07/23/15   Kerrie Buffalo, NP  glipiZIDE (GLUCOTROL XL) 5 MG 24 hr tablet Take 1 tablet (5 mg total) by mouth daily with breakfast. Patient taking differently: Take 10 mg by mouth daily with breakfast.  07/23/15   Kerrie Buffalo, NP  hydrOXYzine (ATARAX/VISTARIL) 25 MG tablet Take 1 tablet (25 mg total) by mouth every 6 (six) hours. 11/30/15   Ozella Almond Rydan Gulyas, PA-C  meloxicam (MOBIC) 7.5 MG tablet Take 1 tablet (7.5 mg total) by mouth daily.  [id number]   Delora Fuel, MD  metFORMIN (GLUCOPHAGE) 500 MG tablet Take 1 tablet (500 mg total) by mouth 2 (two) times daily. For diabetes management 07/23/15   Kerrie Buffalo, NP  ondansetron (ZOFRAN ODT) 8 MG disintegrating tablet Take 1 tablet (8 mg total) by mouth every 8 (eight) hours as needed for nausea or vomiting. 10/28/15   Tatyana Kirichenko, PA-C  oxyCODONE-acetaminophen (PERCOCET) 5-325 MG tablet Take 1 tablet by mouth every 4 (four) hours as needed for moderate pain. [id number]   Delora Fuel, MD  permethrin (ELIMITE) 5 % cream Apply to affected area once 11/30/15   [facility] Gurshan Settlemire, PA-C  simvastatin (ZOCOR) 20 MG tablet Take 1 tablet (20 mg total) by mouth daily at 6 PM. Patient not taking: Reported on 10/23/2015 07/23/15   Kerrie Buffalo, NP    Family History Family History  Problem Relation Age of Onset  . Hypertension Mother   . Diabetes Father     Social History Social History  Substance Use Topics  . Smoking status: Current Every Day Smoker    Packs/day: 1.00    Years: 7.00    Types: Cigarettes    Last attempt to quit: 06/30/2015  . Smokeless tobacco: Never Used  . Alcohol use No     Allergies   Omnipaque [iohexol] and Penicillins   Review of Systems Review of Systems  Constitutional: Negative for fever.  HENT: Negative for facial swelling and trouble swallowing.   Respiratory: Negative for shortness of breath.   Skin:       +itching     Physical Exam Updated Vital Signs BP 138/80 (BP Location: Right Arm)   Pulse 93   Temp 98.3 F (36.8 C) (Oral)   Resp 14   Ht 5\' 7"  (1.702 m)   Wt 126.1 kg   SpO2 100%   BMI 43.54 kg/m   Physical Exam  Constitutional: She is oriented to person, place, and time. She appears well-developed and well-nourished. No distress.  HENT:  Head: Normocephalic and atraumatic.  Airway patent. No oral swelling.   Cardiovascular: Normal rate, regular rhythm, normal heart sounds and intact distal pulses.  Exam reveals no gallop and  no friction rub.   No murmur heard. Pulmonary/Chest: Effort normal and breath sounds normal. No respiratory distress. She has no wheezes. She has no rales. She exhibits no tenderness.  Musculoskeletal: She exhibits no edema.  Neurological: She is alert and oriented to person, place, and time.  Skin: Skin is warm and dry.  No erythema, rash, or other skin lesions appreciated. No lesions of the palms or soles. Patient is itching continuously throughout examination.   Nursing note and vitals reviewed.    ED Treatments / Results  Labs (all labs ordered are listed, but only abnormal results are displayed) Labs Reviewed - No data to display  EKG  EKG Interpretation None       Radiology No results found.  Procedures Procedures (including critical care time)  Medications Ordered in ED Medications  hydrOXYzine (ATARAX/VISTARIL) tablet 25 mg (not administered)     Initial Impression / Assessment and Plan / ED Course  I have reviewed the triage vital signs and the nursing notes.  Pertinent labs & imaging results that were available during my care of the patient were reviewed by me and considered in my medical decision making (see chart for details).  Clinical Course   Tremaine A Pearl presents to ED for generalized itching. Admits to recent exposure to bedbugs and friend who has been told he has bedbugs. On exam, no oral or facial swelling. Airway is patent. Hemodynamically stable and in no acute distress. She is itching continuously on examination. Atarax given in ED. Home care of possible bedbugs discussed with patient who verbalized understanding. Rx for Atarax and permethrin given. Return precautions discussed and all questions answered.  Final Clinical Impressions(s) / ED Diagnoses   Final diagnoses:  Pruritus    New Prescriptions New Prescriptions   HYDROXYZINE (ATARAX/VISTARIL) 25 MG TABLET    Take 1 tablet (25 mg total) by mouth every 6 (six) hours.   PERMETHRIN  (ELIMITE) 5 % CREAM    Apply to affected area once     [facility], PA-C 11/30/15 [place], MD 12/01/15 [phone]

## 2015-11-30 NOTE — ED Triage Notes (Signed)
Pt states that has been exposed to bed bugs and is now itching. No bugs noted.

## 2015-12-08 ENCOUNTER — Emergency Department (HOSPITAL_COMMUNITY): Payer: Medicaid Other

## 2015-12-08 ENCOUNTER — Emergency Department (HOSPITAL_COMMUNITY)
Admission: EM | Admit: 2015-12-08 | Discharge: 2015-12-08 | Disposition: A | Discharge status: Home / Self Care | Payer: Medicaid Other | Attending: Emergency Medicine | Admitting: Emergency Medicine

## 2015-12-08 ENCOUNTER — Encounter (HOSPITAL_COMMUNITY): Payer: Self-pay | Admitting: Emergency Medicine

## 2015-12-08 DIAGNOSIS — E119 Type 2 diabetes mellitus without complications: Secondary | ICD-10-CM | POA: Diagnosis not present

## 2015-12-08 DIAGNOSIS — F17210 Nicotine dependence, cigarettes, uncomplicated: Secondary | ICD-10-CM | POA: Insufficient documentation

## 2015-12-08 DIAGNOSIS — T424X5A Adverse effect of benzodiazepines, initial encounter: Secondary | ICD-10-CM | POA: Insufficient documentation

## 2015-12-08 DIAGNOSIS — Z7984 Long term (current) use of oral hypoglycemic drugs: Secondary | ICD-10-CM | POA: Diagnosis not present

## 2015-12-08 DIAGNOSIS — X58XXXA Exposure to other specified factors, initial encounter: Secondary | ICD-10-CM | POA: Diagnosis not present

## 2015-12-08 DIAGNOSIS — T50905A Adverse effect of unspecified drugs, medicaments and biological substances, initial encounter: Secondary | ICD-10-CM

## 2015-12-08 DIAGNOSIS — R42 Dizziness and giddiness: Secondary | ICD-10-CM | POA: Insufficient documentation

## 2015-12-08 LAB — COMPREHENSIVE METABOLIC PANEL
ALBUMIN: 4 g/dL (ref 3.5–5.0)
ALT: 29 U/L (ref 14–54)
AST: 26 U/L (ref 15–41)
Alkaline Phosphatase: 45 U/L (ref 38–126)
Anion gap: 7 (ref 5–15)
BUN: 7 mg/dL (ref 6–20)
CHLORIDE: 106 mmol/L (ref 101–111)
CO2: 27 mmol/L (ref 22–32)
Calcium: 9 mg/dL (ref 8.9–10.3)
Creatinine, Ser: 0.95 mg/dL (ref 0.44–1.00)
GFR calc Af Amer: >60 mL/min (ref >60)
GFR calc non Af Amer: >60 mL/min (ref >60)
GLUCOSE: 120 mg/dL — AB (ref 65–99)
POTASSIUM: 3.3 mmol/L — AB (ref 3.5–5.1)
SODIUM: 140 mmol/L (ref 135–145)
Total Bilirubin: 0.7 mg/dL (ref 0.3–1.2)
Total Protein: 7.2 g/dL (ref 6.5–8.1)

## 2015-12-08 LAB — CBC WITH DIFFERENTIAL/PLATELET
BASOS ABS: 0 10*3/uL (ref 0.0–0.1)
Basophils Relative: 1 %
Eosinophils Absolute: 0.3 10*3/uL (ref 0.0–0.7)
Eosinophils Relative: 5 %
HEMATOCRIT: 39.2 % (ref 36.0–46.0)
Hemoglobin: 12.4 g/dL (ref 12.0–15.0)
LYMPHS PCT: 42 %
Lymphs Abs: 3 10*3/uL (ref 0.7–4.0)
MCH: 26 pg (ref 26.0–34.0)
MCHC: 31.6 g/dL (ref 30.0–36.0)
MCV: 82.2 fL (ref 78.0–100.0)
MONO ABS: 0.4 10*3/uL (ref 0.1–1.0)
MONOS PCT: 5 %
NEUTROS ABS: 3.5 10*3/uL (ref 1.7–7.7)
Neutrophils Relative %: 47 %
Platelets: 187 10*3/uL (ref 150–400)
RBC: 4.77 MIL/uL (ref 3.87–5.11)
RDW: 14.8 % (ref 11.5–15.5)
WBC: 7.2 10*3/uL (ref 4.0–10.5)

## 2015-12-08 LAB — I-STAT BETA HCG BLOOD, ED (MC, WL, AP ONLY): I-stat hCG, quantitative: <5 m[IU]/mL (ref <5)

## 2015-12-08 LAB — ETHANOL

## 2015-12-08 LAB — CBG MONITORING, ED: GLUCOSE-CAPILLARY: 108 mg/dL — AB (ref 65–99)

## 2015-12-08 NOTE — ED Triage Notes (Signed)
Pt here via EMS- Pt reports dizziness since waking this AM, Pt lethargic at triage, but will wake and answers questions appropriately. Pt a/o x 4.

## 2015-12-08 NOTE — Discharge Instructions (Signed)
I suspect your symptoms today are related to starting Klonopin. Please stop taking Klonopin and follow up with your psychiatrist.

## 2015-12-08 NOTE — ED Provider Notes (Signed)
[facility] DEPT Provider Note   CSN: [id number] Arrival date & time: 12/08/15  [id number]  First Provider Contact:  First MD Initiated Contact with Patient 12/08/15 1000        History   Chief Complaint Chief Complaint  Patient presents with  . Dizziness    HPI [name] is a 25 y.o. female.  [name] is a 25 y.o. Female who presents to the emergency department via EMS complaining of dizziness and lightheadedness started this morning. Patient reports she thought this was due to her diabetes. She reports her blood sugar is normal today. She reports it feels like her vision is blurry and she is tired and fatigued. She reports feeling tired and fatigued for 2 days now. She denies feeling lightheaded with position change. She denies drug use or alcohol use. She denies recent changes to her medications. Later, she remembered she was started on Klonopin 0.5 mg three days ago. She has taken nothing for treatment today. She denies any recent changes to her medications. She denies headache, fevers, neck pain, neck stiffness, double vision, numbness, tingling, weakness, chest pain, shortness of breath, abdominal pain, nausea, vomiting, diarrhea or rashes.   The history is provided by the patient. No language interpreter was used.    Past Medical History:  Diagnosis Date  . Anxiety   . Bipolar 1 disorder ([place])   . Cognitive deficits   . Depression   . Diabetes mellitus without complication ([place])   . Mental disorder   . Obesity     Patient Active Problem List   Diagnosis Date Noted  . 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 Learning disability 07/18/2015  . 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 Diabetes mellitus ([place]) 07/18/2015  . 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 Hyperlipidemia 07/18/2015  . Drug overdose   . Cognitive deficits 10/12/2012  . Generalized anxiety disorder 06/28/2012    Past Surgical  History:  Procedure Laterality Date  . CESAREAN SECTION    . CESAREAN SECTION N/A 04/25/2013   Procedure: REPEAT CESAREAN SECTION;  Surgeon: [name], MD;  Location: [facility];  Service: Obstetrics;  Laterality: N/A;  . MASS EXCISION N/A 06/03/2012   Procedure: EXCISION MASS;  Surgeon: [name], MD;  Location: [facility];  Service: ENT;  Laterality: N/A;  Excision uvula mass  . TONSILLECTOMY N/A 06/03/2012   Procedure: TONSILLECTOMY;  Surgeon: [name], MD;  Location: [facility];  Service: ENT;  Laterality: N/A;  . TONSILLECTOMY      OB History    Gravida Para Term Preterm AB Living   3 3 3  0 0 3   SAB TAB Ectopic Multiple Live Births   0 0 0 0 3       Home Medications    Prior to Admission medications   Medication Sig Start Date End Date Taking? Authorizing Provider  azithromycin (ZITHROMAX) 250 MG tablet Take 1 tablet (250 mg total) by mouth daily. Take first 2 tablets together, then 1 every day until finished. Patient not taking: Reported on 10/28/2015 10/23/15   Dorie Rank, MD  busPIRone (BUSPAR) 15 MG tablet Take 1 tablet (15 mg total) by mouth 3 (three) times daily. Patient taking differently: Take 15 mg by mouth 2 (two) times daily.  07/23/15   Kerrie Buffalo, NP  cetirizine (ZYRTEC) 10 MG tablet Take 1 tablet (10 mg total) by mouth daily. For allergies Patient not taking: Reported on 10/23/2015 07/23/15  Kerrie Buffalo, NP  diphenhydrAMINE (BENADRYL) 25 mg capsule Take 1 capsule (25 mg total) by mouth every 6 (six) hours as needed. 11/21/15   Merryl Hacker, MD  DULoxetine (CYMBALTA) 60 MG capsule Take 60 mg by mouth daily. 09/25/15   Historical Provider, MD  fluvoxaMINE (LUVOX) 25 MG tablet Take 3 tablets (75 mg total) by mouth daily with breakfast. Patient taking differently: Take 100 mg by mouth daily with breakfast.  07/23/15   Kerrie Buffalo, NP  glipiZIDE (GLUCOTROL XL) 5 MG 24 hr tablet Take 1 tablet (5 mg total) by mouth daily  with breakfast. Patient taking differently: Take 10 mg by mouth daily with breakfast.  07/23/15   Kerrie Buffalo, NP  hydrOXYzine (ATARAX/VISTARIL) 25 MG tablet Take 1 tablet (25 mg total) by mouth every 6 (six) hours. 11/30/15   Ozella Almond Ward, PA-C  meloxicam (MOBIC) 7.5 MG tablet Take 1 tablet (7.5 mg total) by mouth daily. [id number]   Delora Fuel, MD  metFORMIN (GLUCOPHAGE) 500 MG tablet Take 1 tablet (500 mg total) by mouth 2 (two) times daily. For diabetes management 07/23/15   Kerrie Buffalo, NP  ondansetron (ZOFRAN ODT) 8 MG disintegrating tablet Take 1 tablet (8 mg total) by mouth every 8 (eight) hours as needed for nausea or vomiting. 10/28/15   Tatyana Kirichenko, PA-C  oxyCODONE-acetaminophen (PERCOCET) 5-325 MG tablet Take 1 tablet by mouth every 4 (four) hours as needed for moderate pain. [id number]   Delora Fuel, MD  permethrin (ELIMITE) 5 % cream Apply to affected area once 11/30/15   [facility] Ward, PA-C  simvastatin (ZOCOR) 20 MG tablet Take 1 tablet (20 mg total) by mouth daily at 6 PM. Patient not taking: Reported on 10/23/2015 07/23/15   Kerrie Buffalo, NP    Family History Family History  Problem Relation Age of Onset  . Hypertension Mother   . Diabetes Father     Social History Social History  Substance Use Topics  . Smoking status: Current Every Day Smoker    Packs/day: 1.00    Years: 7.00    Types: Cigarettes    Last attempt to quit: 06/30/2015  . Smokeless tobacco: Never Used  . Alcohol use No     Allergies   Omnipaque [iohexol] and Penicillins   Review of Systems Review of Systems  Constitutional: Positive for fatigue. Negative for chills and fever.  HENT: Negative for congestion, sore throat and trouble swallowing.   Eyes: Negative for visual disturbance.  Respiratory: Negative for cough and shortness of breath.   Cardiovascular: Negative for chest pain.  Gastrointestinal: Negative for abdominal pain, diarrhea, nausea and vomiting.  Genitourinary:  Negative for difficulty urinating and dysuria.  Musculoskeletal: Negative for back pain, neck pain and neck stiffness.  Skin: Negative for rash and wound.  Neurological: Positive for dizziness and light-headedness. Negative for syncope, speech difficulty, weakness, numbness and headaches.     Physical Exam Updated Vital Signs BP (!) 122/47   Pulse 103   Temp 98.7 F (37.1 C) (Oral)   Resp (!) 30   Ht 5\' 7"  (1.702 m)   Wt 126.1 kg   SpO2 99%   BMI 43.54 kg/m   Physical Exam  Constitutional: She is oriented to person, place, and time. She appears well-developed and well-nourished. No distress.  Nontoxic appearing. Patient alert on entering the room. During the interview she often closes her eyes, patient remains alert.   HENT:  Head: Normocephalic and atraumatic.  Mouth/Throat: Oropharynx is clear and moist.  Eyes:  Conjunctivae and EOM are normal. Pupils are equal, round, and reactive to light. Right eye exhibits no discharge. Left eye exhibits no discharge.  Neck: Normal range of motion. Neck supple. No JVD present.  Cardiovascular: Normal rate, regular rhythm, normal heart sounds and intact distal pulses.  Exam reveals no gallop and no friction rub.   No murmur heard. HR 100.   Pulmonary/Chest: Effort normal and breath sounds normal. No stridor. No respiratory distress. She has no wheezes. She has no rales.  Lungs are clear to auscultation bilaterally.  Abdominal: Soft. There is no tenderness. There is no rebound.  Abdomen is soft and nontender to palpation.  Musculoskeletal: Normal range of motion. She exhibits no edema or tenderness.  Patient is spontaneously moving all extremities in a coordinated fashion exhibiting good strength.  Good strength in her bilateral upper and lower extremities.  Lymphadenopathy:    She has no cervical adenopathy.  Neurological: She is alert and oriented to person, place, and time. No cranial nerve deficit. Coordination normal.  The patient is  alert and oriented 3. Cranial nerves are intact. Speech is clear and coherent. No pronator drift. Finger to nose intact bilaterally. Heel-to-shin intact bilaterally. Good sphincter bilateral upper and lower extremities. EOMs are intact. Patient continuously close her eyes during interview. She remains alert. Normal gait.   Skin: Skin is warm and dry. Capillary refill takes less than 2 seconds. No rash noted. She is not diaphoretic. No erythema. No pallor.  Psychiatric: She has a normal mood and affect. Her behavior is normal.  Nursing note and vitals reviewed.    ED Treatments / Results  Labs (all labs ordered are listed, but only abnormal results are displayed) Labs Reviewed  COMPREHENSIVE METABOLIC PANEL - Abnormal; Notable for the following:       Result Value   Potassium 3.3 (*)    Glucose, Bld 120 (*)    All other components within normal limits  CBG MONITORING, ED - Abnormal; Notable for the following:    Glucose-Capillary 108 (*)    All other components within normal limits  ETHANOL  CBC WITH DIFFERENTIAL/PLATELET  I-STAT BETA HCG BLOOD, ED ([facility], [facility], [facility] ONLY)    EKG  EKG Interpretation None       Radiology Ct Head Wo Contrast  Result Date: 12/08/2015 CLINICAL DATA:  Dizziness, lightheadedness and instability when standing beginning this morning, history of diabetes mellitus, smoking EXAM: CT HEAD WITHOUT CONTRAST TECHNIQUE: Contiguous axial images were obtained from the base of the skull through the vertex without intravenous contrast. COMPARISON:  06/04/2015 FINDINGS: Normal ventricular morphology. No midline shift or mass effect. Normal appearance of brain parenchyma. No intracranial hemorrhage, mass lesion, evidence of acute infarction or extra-axial fluid collection. Mucosal retention cyst RIGHT maxillary sinus. Scattered mucosal thickening in LEFT maxillary sinus and ethmoid air cells. No acute osseous abnormalities. IMPRESSION: No acute intracranial abnormalities.  Electronically Signed   By: Lavonia Dana M.D.   On: 12/08/2015 11:04    Procedures Procedures (including critical care time)  Medications Ordered in ED Medications - No data to display   Initial Impression / Assessment and Plan / ED Course  I have reviewed the triage vital signs and the nursing notes.  Pertinent labs & imaging results that were available during my care of the patient were reviewed by me and considered in my medical decision making (see chart for details).  Clinical Course  Comment By Time  HR 96 at bedside. She reports feeling back to normal. She  reports just starting Klonopin 3 days ago and suspects this might have caused her symptoms.  Waynetta Pean, PA-C 08/13 1144    This  is a 25 y.o. Female who presents to the emergency department via EMS complaining of dizziness and lightheadedness started this morning. Patient reports she thought this was due to her diabetes. She reports her blood sugar is normal today. She reports it feels like her vision is blurry and she is tired and fatigued. She reports feeling tired and fatigued for 2 days now. She denies feeling lightheaded with position change. She denies drug use or alcohol use. She denies recent changes to her medications. Later, she remembered she was started on Klonopin 0.5 mg three days ago. On exam the patient is afebrile nontoxic appearing. She has no focal neurological deficits. Normal gait. She appears sleepy. Later, she remembers she was started on Klonopin 3 days ago. At recheck she reports feeling back to normal. She underwent without difficulty. She has no dizziness and does not feel fatigued. She is not sleepy on exam. Heart rate is 96 on exam. I suspect her symptoms are likely caused from her starting Klonopin. She's been on Klonopin 0.5 mg. We'll have her discontinue this. CBC and CMP are unremarkable. Ethanol level is 0. Pregnancy test is negative. CT head was unremarkable. Patient has no urinary symptoms. No  need for urinalysis. No need for urine drug screen at this time. Advised patient to have her discontinue Klonopin and will have her follow up closely with her psychiatrist and primary care doctor. Patient agrees to this plan. I advised the patient to follow-up with their primary care provider this week. I advised the patient to return to the emergency department with new or worsening symptoms or new concerns. The patient verbalized understanding and agreement with plan.      Final Clinical Impressions(s) / ED Diagnoses   Final diagnoses:  Dizziness  Medication adverse effect, initial encounter    New Prescriptions New Prescriptions   No medications on file     Waynetta Pean, PA-C 12/08/15 1152    Lacretia Leigh, MD 12/09/15 [phone]

## 2015-12-10 ENCOUNTER — Emergency Department (HOSPITAL_COMMUNITY)
Admission: EM | Admit: 2015-12-10 | Discharge: 2015-12-10 | Discharge status: Against Medical Advice | Payer: Medicaid Other | Attending: Emergency Medicine | Admitting: Emergency Medicine

## 2015-12-10 ENCOUNTER — Encounter (HOSPITAL_COMMUNITY): Payer: Self-pay

## 2015-12-10 DIAGNOSIS — R1084 Generalized abdominal pain: Secondary | ICD-10-CM | POA: Insufficient documentation

## 2015-12-10 DIAGNOSIS — E119 Type 2 diabetes mellitus without complications: Secondary | ICD-10-CM | POA: Insufficient documentation

## 2015-12-10 DIAGNOSIS — Z7984 Long term (current) use of oral hypoglycemic drugs: Secondary | ICD-10-CM | POA: Insufficient documentation

## 2015-12-10 DIAGNOSIS — R52 Pain, unspecified: Secondary | ICD-10-CM

## 2015-12-10 DIAGNOSIS — F17210 Nicotine dependence, cigarettes, uncomplicated: Secondary | ICD-10-CM | POA: Insufficient documentation

## 2015-12-10 LAB — URINALYSIS, ROUTINE W REFLEX MICROSCOPIC
Bilirubin Urine: NEGATIVE
Glucose, UA: NEGATIVE mg/dL
Hgb urine dipstick: NEGATIVE
Ketones, ur: NEGATIVE mg/dL
Leukocytes, UA: NEGATIVE
Nitrite: NEGATIVE
Protein, ur: NEGATIVE mg/dL
Specific Gravity, Urine: 1.026 (ref 1.005–1.030)
pH: 6 (ref 5.0–8.0)

## 2015-12-10 LAB — COMPREHENSIVE METABOLIC PANEL
ALT: 35 U/L (ref 14–54)
AST: 34 U/L (ref 15–41)
Albumin: 4 g/dL (ref 3.5–5.0)
Alkaline Phosphatase: 50 U/L (ref 38–126)
Anion gap: 6 (ref 5–15)
BUN: 11 mg/dL (ref 6–20)
CO2: 25 mmol/L (ref 22–32)
Calcium: 9.3 mg/dL (ref 8.9–10.3)
Chloride: 108 mmol/L (ref 101–111)
Creatinine, Ser: 0.88 mg/dL (ref 0.44–1.00)
GFR calc Af Amer: >60 mL/min (ref >60)
GFR calc non Af Amer: >60 mL/min (ref >60)
Glucose, Bld: 104 mg/dL — ABNORMAL HIGH (ref 65–99)
Potassium: 3.9 mmol/L (ref 3.5–5.1)
Sodium: 139 mmol/L (ref 135–145)
Total Bilirubin: 0.7 mg/dL (ref 0.3–1.2)
Total Protein: 6.5 g/dL (ref 6.5–8.1)

## 2015-12-10 LAB — CBC WITH DIFFERENTIAL/PLATELET
Basophils Absolute: 0 10*3/uL (ref 0.0–0.1)
Basophils Relative: 0 %
Eosinophils Absolute: 0.4 10*3/uL (ref 0.0–0.7)
Eosinophils Relative: 5 %
HCT: 37.9 % (ref 36.0–46.0)
Hemoglobin: 12 g/dL (ref 12.0–15.0)
Lymphocytes Relative: 41 %
Lymphs Abs: 3.4 10*3/uL (ref 0.7–4.0)
MCH: 26 pg (ref 26.0–34.0)
MCHC: 31.7 g/dL (ref 30.0–36.0)
MCV: 82 fL (ref 78.0–100.0)
Monocytes Absolute: 0.4 10*3/uL (ref 0.1–1.0)
Monocytes Relative: 5 %
Neutro Abs: 4.1 10*3/uL (ref 1.7–7.7)
Neutrophils Relative %: 49 %
Platelets: 200 10*3/uL (ref 150–400)
RBC: 4.62 MIL/uL (ref 3.87–5.11)
RDW: 14.6 % (ref 11.5–15.5)
WBC: 8.3 10*3/uL (ref 4.0–10.5)

## 2015-12-10 LAB — PREGNANCY, URINE: Preg Test, Ur: NEGATIVE

## 2015-12-10 LAB — WET PREP, GENITAL
Clue Cells Wet Prep HPF POC: NONE SEEN
Sperm: NONE SEEN
Trich, Wet Prep: NONE SEEN
Yeast Wet Prep HPF POC: NONE SEEN

## 2015-12-10 LAB — RPR: RPR Ser Ql: NONREACTIVE

## 2015-12-10 LAB — LIPASE, BLOOD: Lipase: 26 U/L (ref 11–51)

## 2015-12-10 LAB — HIV ANTIBODY (ROUTINE TESTING W REFLEX): HIV Screen 4th Generation wRfx: NONREACTIVE

## 2015-12-10 MED ORDER — DICYCLOMINE HCL 10 MG/ML IM SOLN
20.0000 mg | Freq: Once | INTRAMUSCULAR | Status: AC
  Administered 2015-12-10: 20 mg via INTRAMUSCULAR
  Filled 2015-12-10: qty 2

## 2015-12-10 MED ORDER — BARIUM SULFATE 2.1 % PO SUSP
ORAL | Status: AC
  Filled 2015-12-10: qty 2

## 2015-12-10 NOTE — Discharge Instructions (Signed)
Treatment: You can take over-the-counter Tylenol or ibuprofen for your pain. You will be called in 2-3 days if any of your results return positive. Please seek treatment at the OB/GYN or health Department if any of your results return positive. If any results are positive, please make any of your sexual partners aware that they will need to be treated as well.  Follow-up: Please follow-up with gastroenterology and OB/GYN as outlined in your discharge paperwork for further evaluation and treatment of your symptoms. Please return to the emergency department if you develop any new or worsening symptoms.

## 2015-12-10 NOTE — ED Triage Notes (Signed)
Pt arrives POV with c/o abdominal pain and tiredness with a swollen left leg. Pt states her symptoms have been intermittent over last several months. Pt states she has no appetite but eating biscuit on arrival. C/o tiredness over last 4 days.

## 2015-12-10 NOTE — ED Notes (Addendum)
Pt ambulated to and from restroom, tolerated well; pt placed back on monitor upon return from restroom

## 2015-12-10 NOTE — ED Notes (Signed)
Pt walks out and [facility] Pt ambulates to discharge with steady gait.

## 2015-12-10 NOTE — ED Notes (Signed)
MD at bedside. 

## 2015-12-10 NOTE — ED Provider Notes (Signed)
[facility] DEPT Provider Note   CSN: [id number] Arrival date & time: 12/10/15  0751     History   Chief Complaint Chief Complaint  Patient presents with  . Abdominal Pain  . Weakness    HPI [name] is a 25 y.o. female with history of bipolar 1 disorder, cognitive deficits, diabetes, obesity who presents with generalized abdominal pain that has been intermittent for the past 6 months and fatigue that has been present for the past 4 days. Patient describes her abdominal pain as sharp and constant when it is present.. She states that her pain is mostly epigastric, however sometimes lower. Patient reports her pain is worse with lying down on her abdomen. Patient reports that she saw "white, cream stuff" in her bowel movement last evening. Patient states she chronically has diarrhea. Patient denies any blood in her stool. Patient states that she has intermittent nausea and vomiting, about once a month. Patient's has not had a period in 2 years because she has a Nexplanon implant. Patient denies any abnormal vaginal discharge. Patient is sexually active and states that STD exposure is possible. Patient has not tried any medications at home for her symptoms. Patient denies any chest pain, shortness of breath, dysuria. Patient reports that she had one episode of right low back pain last night. Patient also reports that she feels that her left leg has been a little bigger than her right leg. Patient denies any leg or calf pain. Patient has been seen for this complaint in the past and has had a negative pelvic ultrasound and CT scan which showed possible endometriosis, however no other intra-abdominal pathology. Patient has been referred to GI, however she states she did not go to the appointment because her abdomen was not hurting that day.  HPI  Past Medical History:  Diagnosis Date  . Anxiety   . Bipolar 1 disorder ([place])   . Cognitive deficits   . Depression   . Diabetes mellitus  without complication ([place])   . Mental disorder   . Obesity     Patient Active Problem List   Diagnosis Date Noted  . 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 Learning disability 07/18/2015  . 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 Diabetes mellitus ([place]) 07/18/2015  . 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 Hyperlipidemia 07/18/2015  . Drug overdose   . Cognitive deficits 10/12/2012  . Generalized anxiety disorder 06/28/2012    Past Surgical History:  Procedure Laterality Date  . CESAREAN SECTION    . CESAREAN SECTION N/A 04/25/2013   Procedure: REPEAT CESAREAN SECTION;  Surgeon: [name], MD;  Location: [facility];  Service: Obstetrics;  Laterality: N/A;  . MASS EXCISION N/A 06/03/2012   Procedure: EXCISION MASS;  Surgeon: [name], MD;  Location: [facility];  Service: ENT;  Laterality: N/A;  Excision uvula mass  . TONSILLECTOMY N/A 06/03/2012   Procedure: TONSILLECTOMY;  Surgeon: [name], MD;  Location: [facility];  Service: ENT;  Laterality: N/A;  . TONSILLECTOMY      OB History    Gravida Para Term Preterm AB Living   3 3 3  0 0 3   SAB TAB Ectopic Multiple Live Births   0 0 0 0 3       Home Medications    Prior to Admission medications   Medication Sig Start Date End Date Taking? Authorizing Provider  busPIRone (BUSPAR) 15 MG tablet Take 15 mg by mouth  daily.   Yes Historical Provider, MD  diphenhydrAMINE (BENADRYL) 25 mg capsule Take 1 capsule (25 mg total) by mouth every 6 (six) hours as needed. 11/21/15  Yes Merryl Hacker, MD  DULoxetine (CYMBALTA) 60 MG capsule Take 60 mg by mouth daily. 09/25/15  Yes Historical Provider, MD  DULoxetine (CYMBALTA) 60 MG capsule Take 60 mg by mouth daily.   Yes Historical Provider, MD  fluvoxaMINE (LUVOX) 100 MG tablet Take 100 mg by mouth at bedtime.   Yes Historical Provider, MD  glipiZIDE (GLUCOTROL) 10 MG tablet Take 10 mg by  mouth daily before breakfast.   Yes Historical Provider, MD  metFORMIN (GLUCOPHAGE) 500 MG tablet Take 500 mg by mouth 2 (two) times daily with a meal.   Yes Historical Provider, MD  paliperidone (INVEGA SUSTENNA) 234 MG/1.5ML SUSP injection Inject 234 mg into the muscle every 30 (thirty) days.   Yes Historical Provider, MD  fluvoxaMINE (LUVOX) 25 MG tablet Take 3 tablets (75 mg total) by mouth daily with breakfast. Patient taking differently: Take 100 mg by mouth daily with breakfast.  07/23/15   Kerrie Buffalo, NP  hydrOXYzine (ATARAX/VISTARIL) 25 MG tablet Take 1 tablet (25 mg total) by mouth every 6 (six) hours. 11/30/15   [place], PA-C    Family History Family History  Problem Relation Age of Onset  . Hypertension Mother   . Diabetes Father     Social History Social History  Substance Use Topics  . Smoking status: Current Every Day Smoker    Packs/day: 1.00    Years: 7.00    Types: Cigarettes    Last attempt to quit: 06/30/2015  . Smokeless tobacco: Never Used  . Alcohol use No     Allergies   Omnipaque [iohexol] and Penicillins   Review of Systems Review of Systems  Constitutional: Positive for fatigue. Negative for chills and fever.  HENT: Negative for facial swelling.   Respiratory: Negative for shortness of breath.   Cardiovascular: Negative for chest pain.  Gastrointestinal: Positive for abdominal pain and diarrhea. Negative for blood in stool, nausea and vomiting.  Genitourinary: Negative for dysuria.  Musculoskeletal: Negative for back pain.  Skin: Negative for rash and wound.  Neurological: Negative for headaches.  Psychiatric/Behavioral: The patient is not nervous/anxious.      Physical Exam Updated Vital Signs BP 132/84   Pulse 103   Temp 99 F (37.2 C) (Oral)   Resp 18   Ht 5\' 7"  (1.702 m)   Wt 125.6 kg   LMP  (LMP Unknown)   SpO2 99%   BMI 43.38 kg/m   Physical Exam  Constitutional: She appears well-developed and well-nourished. No  distress.  HENT:  Head: Normocephalic and atraumatic.  Mouth/Throat: Oropharynx is clear and moist. No oropharyngeal exudate.  Eyes: Conjunctivae are normal. Pupils are equal, round, and reactive to light. Right eye exhibits no discharge. Left eye exhibits no discharge. No scleral icterus.  Neck: Normal range of motion. Neck supple. No thyromegaly present.  Cardiovascular: Normal rate, regular rhythm, normal heart sounds and intact distal pulses.  Exam reveals no gallop and no friction rub.   No murmur heard. Pulmonary/Chest: Effort normal and breath sounds normal. No stridor. No respiratory distress. She has no wheezes. She has no rales.  Abdominal: Soft. Bowel sounds are normal. She exhibits no distension. There is tenderness in the right lower quadrant, epigastric area and suprapubic area. There is tenderness at McBurney's point. There is no rebound, no guarding and negative Murphy's sign.  Genitourinary: Pelvic exam was performed with patient prone. There is no rash or tenderness on the right labia. There is no rash or tenderness on the left labia. Uterus is not tender. Cervix exhibits discharge (white). Cervix exhibits no motion tenderness. Right adnexum displays no mass and no tenderness. Left adnexum displays no mass and no tenderness. No bleeding in the vagina. Vaginal discharge (white) found.  Genitourinary Comments: Chaperone present; pain with speculum insertion only  Musculoskeletal: She exhibits no edema.  No calf tenderness on palpation bilaterally  Lymphadenopathy:    She has no cervical adenopathy.  Neurological: She is alert. Coordination normal.  Skin: Skin is warm and dry. No rash noted. She is not diaphoretic. No pallor.  Psychiatric: She has a normal mood and affect.  Nursing note and vitals reviewed.    ED Treatments / Results  Labs (all labs ordered are listed, but only abnormal results are displayed) Labs Reviewed  WET PREP, GENITAL - Abnormal; Notable for the  following:       Result Value   WBC, Wet Prep HPF POC MANY (*)    All other components within normal limits  COMPREHENSIVE METABOLIC PANEL - Abnormal; Notable for the following:    Glucose, Bld 104 (*)    All other components within normal limits  LIPASE, BLOOD  CBC WITH DIFFERENTIAL/PLATELET  URINALYSIS, ROUTINE W REFLEX MICROSCOPIC (NOT AT [facility])  PREGNANCY, URINE  RPR  HIV ANTIBODY (ROUTINE TESTING)  URINALYSIS, ROUTINE W REFLEX MICROSCOPIC (NOT AT [facility])  GC/CHLAMYDIA PROBE AMP ([place]) NOT AT [facility]    EKG  EKG Interpretation None       Radiology No results found.  Procedures Procedures (including critical care time)  Medications Ordered in ED Medications  Barium Sulfate 2.1 % SUSP (not administered)  dicyclomine (BENTYL) injection 20 mg (20 mg Intramuscular Given 12/10/15 0842)     Initial Impression / Assessment and Plan / ED Course  I have reviewed the triage vital signs and the nursing notes.  Pertinent labs & imaging results that were available during my care of the patient were reviewed by me and considered in my medical decision making (see chart for details).  Clinical Course    Patient left AMA. CBC unremarkable. CMP shows glucose 104. Lipase 26. UA negative. Urine pregnancy negative. Wet prep shows many white blood cells. HIV, RPR pending. GC/chlamydia sent. Patient would like to wait for cultures to be treated because she does not want to get another injection. No concern for PID at this time. Patient given Bentyl IM injection in ED. CT abdomen pelvis with oral contrast only was pending, however patient did not want to drink the contrast because she had to do this last time in the imaging did not show anything. Patient to follow-up with [facility] and gastroenterology for further evaluation and annual OB/GYN exam, however patient left prior to getting her discharge paperwork for referral. Patient has had a referral to gastroenterology in the  past which she did not attend. Patient vitals stable throughout ED course.  Final Clinical Impressions(s) / ED Diagnoses   Final diagnoses:  Generalized abdominal pain    New Prescriptions Discharge Medication List as of 12/10/2015 11:03 AM       Frederica Kuster, PA-C 12/10/15 [place], MD 12/11/15 1646

## 2015-12-10 NOTE — ED Notes (Signed)
CT tech at bedside to give po contrast. Pt indicates she does not want to drink contrast "it takes too long". Will inform PA.

## 2015-12-11 LAB — GC/CHLAMYDIA PROBE AMP (~~LOC~~) NOT AT ARMC
Chlamydia: NEGATIVE
Neisseria Gonorrhea: NEGATIVE

## 2015-12-12 ENCOUNTER — Encounter (HOSPITAL_COMMUNITY): Payer: Self-pay | Admitting: Emergency Medicine

## 2015-12-12 DIAGNOSIS — Z7984 Long term (current) use of oral hypoglycemic drugs: Secondary | ICD-10-CM | POA: Diagnosis not present

## 2015-12-12 DIAGNOSIS — E119 Type 2 diabetes mellitus without complications: Secondary | ICD-10-CM | POA: Diagnosis not present

## 2015-12-12 DIAGNOSIS — R531 Weakness: Secondary | ICD-10-CM | POA: Diagnosis present

## 2015-12-12 DIAGNOSIS — F17210 Nicotine dependence, cigarettes, uncomplicated: Secondary | ICD-10-CM | POA: Diagnosis not present

## 2015-12-12 LAB — URINALYSIS, ROUTINE W REFLEX MICROSCOPIC
GLUCOSE, UA: NEGATIVE mg/dL
HGB URINE DIPSTICK: NEGATIVE
KETONES UR: NEGATIVE mg/dL
LEUKOCYTES UA: NEGATIVE
NITRITE: NEGATIVE
PROTEIN: 30 mg/dL — AB
Specific Gravity, Urine: 1.03 (ref 1.005–1.030)
pH: 6 (ref 5.0–8.0)

## 2015-12-12 LAB — CBC
HEMATOCRIT: 40.1 % (ref 36.0–46.0)
HEMOGLOBIN: 12.7 g/dL (ref 12.0–15.0)
MCH: 26.1 pg (ref 26.0–34.0)
MCHC: 31.7 g/dL (ref 30.0–36.0)
MCV: 82.5 fL (ref 78.0–100.0)
Platelets: 225 10*3/uL (ref 150–400)
RBC: 4.86 MIL/uL (ref 3.87–5.11)
RDW: 14.6 % (ref 11.5–15.5)
WBC: 8.4 10*3/uL (ref 4.0–10.5)

## 2015-12-12 LAB — BASIC METABOLIC PANEL
ANION GAP: 5 (ref 5–15)
BUN: 9 mg/dL (ref 6–20)
CO2: 24 mmol/L (ref 22–32)
Calcium: 9 mg/dL (ref 8.9–10.3)
Chloride: 110 mmol/L (ref 101–111)
Creatinine, Ser: 0.88 mg/dL (ref 0.44–1.00)
GFR calc Af Amer: >60 mL/min (ref >60)
GLUCOSE: 128 mg/dL — AB (ref 65–99)
POTASSIUM: 3.4 mmol/L — AB (ref 3.5–5.1)
Sodium: 139 mmol/L (ref 135–145)

## 2015-12-12 LAB — URINE MICROSCOPIC-ADD ON: RBC / HPF: NONE SEEN RBC/hpf (ref 0–5)

## 2015-12-12 NOTE — ED Triage Notes (Signed)
PT REPORTS FEELING "TIRED AND WEAK FOR THE LAST 2 WEEKS". Pt states she has been since for the same for unable to give diagnoses. Pt is alert and ox4 no neuro deficits at triage.

## 2015-12-13 ENCOUNTER — Emergency Department (HOSPITAL_COMMUNITY)
Admission: EM | Admit: 2015-12-13 | Discharge: 2015-12-13 | Disposition: A | Discharge status: Home / Self Care | Payer: Medicaid Other | Attending: Emergency Medicine | Admitting: Emergency Medicine

## 2015-12-13 DIAGNOSIS — R531 Weakness: Secondary | ICD-10-CM

## 2015-12-13 NOTE — Discharge Instructions (Signed)
Your caregiver has seen you today because you are having problems with feelings of weakness, dizziness, and/or fatigue. Weakness has many different causes, some of which are common and others are very rare. Your caregiver has considered some of the most common causes of weakness and feels it is safe for you to go home and be observed. Not every illness or injury can be identified during an emergency department visit, thus follow-up with your primary healthcare provider is important. Medical conditions can also worsen, so it is also important to return immediately as directed below, or if you have other serious concerns develop. ° °RETURN IMMEDIATELY IF  °you develop new shortness of breath, chest pain, fever, have difficulty moving parts of your body (new weakness, numbness, or incoordination), sudden change in speech, vision, swallowing, or understanding, faint or develop new dizziness, severe headache, become poorly responsive or have an altered mental status compared to baseline for you, new rash, abdominal pain, or bloody stools,  °Return sooner also if you develop new problems for which you have not talked to your caregiver but you feel may be emergency medical conditions. ° °

## 2015-12-13 NOTE — ED Provider Notes (Signed)
[facility] DEPT Provider Note   CSN: [id number] Arrival date & time: 12/12/15  1801  By signing my name below, I, Ephriam Jenkins, attest that this documentation has been prepared under the direction and in the presence of Ripley Fraise, MD. Electronically signed, Ephriam Jenkins, ED Scribe. 12/13/15. 2:11 AM.  History   Chief Complaint Chief Complaint  Patient presents with  . Weakness    HPI HPI Comments: [name] is a 25 y.o. Female with a PMHx of depression, BPD, Anxiety, DM, who presents to the Emergency Department complaining of constant weakness and fatigue that has been persistent for the past two weeks. Pt states she has been constantly tired every day for the past two weeks to the point where she wants to "sleep" all day. Today, pt states that she was so fatigued that she could "barely stand up and walk". Pt has not seen her PCP about her symptoms. Pt denies any syncopal episode. No new abdominal pain. Pt denies fever, vomiting, headache, chest pain, cough, vaginal bleeding, dysuria. Pt denies any new medications.   The history is provided by the patient. No language interpreter was used.   Pertinent negatives include no chest pain and no headaches.    Past Medical History:  Diagnosis Date  . Anxiety   . Bipolar 1 disorder ([place])   . Cognitive deficits   . Depression   . Diabetes mellitus without complication ([place])   . Mental disorder   . Obesity     Patient Active Problem List   Diagnosis Date Noted  . 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 Learning disability 07/18/2015  . 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 Diabetes mellitus ([place]) 07/18/2015  . 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 Hyperlipidemia 07/18/2015  . Drug overdose   . Cognitive deficits 10/12/2012  . Generalized anxiety disorder 06/28/2012    Past Surgical History:  Procedure Laterality Date  . CESAREAN SECTION    . CESAREAN  SECTION N/A 04/25/2013   Procedure: REPEAT CESAREAN SECTION;  Surgeon: [name], MD;  Location: [facility];  Service: Obstetrics;  Laterality: N/A;  . MASS EXCISION N/A 06/03/2012   Procedure: EXCISION MASS;  Surgeon: [name], MD;  Location: [facility];  Service: ENT;  Laterality: N/A;  Excision uvula mass  . TONSILLECTOMY N/A 06/03/2012   Procedure: TONSILLECTOMY;  Surgeon: [name], MD;  Location: [facility];  Service: ENT;  Laterality: N/A;  . TONSILLECTOMY      OB History    Gravida Para Term Preterm AB Living   3 3 3  0 0 3   SAB TAB Ectopic Multiple Live Births   0 0 0 0 3     Home Medications    Prior to Admission medications   Medication Sig Start Date End Date Taking? Authorizing Provider  busPIRone (BUSPAR) 15 MG tablet Take 15 mg by mouth daily.    Historical Provider, MD  diphenhydrAMINE (BENADRYL) 25 mg capsule Take 1 capsule (25 mg total) by mouth every 6 (six) hours as needed. 11/21/15   Merryl Hacker, MD  DULoxetine (CYMBALTA) 60 MG capsule Take 60 mg by mouth daily. 09/25/15   Historical Provider, MD  DULoxetine (CYMBALTA) 60 MG capsule Take 60 mg by mouth daily.    Historical Provider, MD  fluvoxaMINE (LUVOX) 100 MG tablet Take 100 mg by mouth at bedtime.    Historical Provider, MD  fluvoxaMINE (LUVOX) 25 MG tablet Take 3 tablets (75 mg  total) by mouth daily with breakfast. Patient taking differently: Take 100 mg by mouth daily with breakfast.  07/23/15   Kerrie Buffalo, NP  glipiZIDE (GLUCOTROL) 10 MG tablet Take 10 mg by mouth daily before breakfast.    Historical Provider, MD  hydrOXYzine (ATARAX/VISTARIL) 25 MG tablet Take 1 tablet (25 mg total) by mouth every 6 (six) hours. 11/30/15   Ozella Almond Ward, PA-C  metFORMIN (GLUCOPHAGE) 500 MG tablet Take 500 mg by mouth 2 (two) times daily with a meal.    Historical Provider, MD  paliperidone (INVEGA SUSTENNA) 234 MG/1.5ML SUSP injection Inject 234 mg into the muscle every 30  (thirty) days.    Historical Provider, MD   Family History Family History  Problem Relation Age of Onset  . Hypertension Mother   . Diabetes Father    Social History Social History  Substance Use Topics  . Smoking status: Current Every Day Smoker    Packs/day: 1.00    Years: 7.00    Types: Cigarettes    Last attempt to quit: 06/30/2015  . Smokeless tobacco: Never Used  . Alcohol use No   Allergies   Omnipaque [iohexol] and Penicillins  Review of Systems Review of Systems  Constitutional: Positive for fatigue. Negative for fever.  Cardiovascular: Negative for chest pain.  Gastrointestinal: Negative for vomiting.  Genitourinary: Negative for dysuria and vaginal bleeding.  Neurological: Positive for weakness (generalized). Negative for syncope and headaches.  All other systems reviewed and are negative.  Physical Exam Updated Vital Signs BP 142/99 (BP Location: Right Arm)   Pulse 93   Temp 98.4 F (36.9 C) (Oral)   Resp 22   Ht 5\' 7"  (1.702 m)   Wt 278 lb (126.1 kg)   LMP  (LMP Unknown)   SpO2 99%   BMI 43.54 kg/m   Physical Exam CONSTITUTIONAL: Well developed/well nourished HEAD: Normocephalic/atraumatic EYES: EOMI/PERRL ENMT: Mucous membranes moist NECK: supple no meningeal signs SPINE/BACK:entire spine nontender CV: S1/S2 noted, no murmurs/rubs/gallops noted LUNGS: Lungs are clear to auscultation bilaterally, no apparent distress ABDOMEN: soft, nontender NEURO: Pt is awake/alert/appropriate, moves all extremitiesx4.  No facial droop. No arm or leg drift noted. EXTREMITIES: pulses normal/equal, full ROM SKIN: warm, color normal PSYCH: flat affect   ED Treatments / Results  DIAGNOSTIC STUDIES: Oxygen Saturation is 99% on RA, normal by my interpretation.  COORDINATION OF CARE: 1:54 AM-Will order EKG. Discussed treatment plan with pt at bedside and pt agreed to plan.   Labs (all labs ordered are listed, but only abnormal results are displayed) Labs  Reviewed  BASIC METABOLIC PANEL - Abnormal; Notable for the following:       Result Value   Potassium 3.4 (*)    Glucose, Bld 128 (*)    All other components within normal limits  URINALYSIS, ROUTINE W REFLEX MICROSCOPIC (NOT AT [facility]) - Abnormal; Notable for the following:    Bilirubin Urine SMALL (*)    Protein, ur 30 (*)    All other components within normal limits  URINE MICROSCOPIC-ADD ON - Abnormal; Notable for the following:    Squamous Epithelial / LPF 0-5 (*)    Bacteria, UA RARE (*)    Crystals CA OXALATE CRYSTALS (*)    All other components within normal limits  CBC    EKG  EKG Interpretation  Date/Time:  Friday December 13 2015 02:25:41 EDT Ventricular Rate:  97 PR Interval:    QRS Duration: 90 QT Interval:  382 QTC Calculation: 486 R Axis:  89 Text Interpretation:  Sinus rhythm Borderline T wave abnormalities Borderline prolonged QT interval No significant change since last tracing Confirmed by Christy Gentles  MD, Shanica Castellanos ([id number]) on 12/13/2015 3:32:27 AM       Radiology No results found.  Procedures Procedures (including critical care time)  Medications Ordered in ED Medications - No data to display   Initial Impression / Assessment and Plan / ED Course  I have reviewed the triage vital signs and the nursing notes.  Pertinent labs results that were available during my care of the patient were reviewed by me and considered in my medical decision making (see chart for details).  Clinical Course     Pt in the ED for continued weakness She has no focal weakness on exam Overall well appearing Vitals not c/w orthostatic hypotension EKG essentially unchanged Labs reassuring Denies new medications Advised need for PCP followup and re-evaluation  Final Clinical Impressions(s) / ED Diagnoses   Final diagnoses:  Weakness    New Prescriptions New Prescriptions   No medications on file  I personally performed the services described in this documentation,  which was scribed in my presence. The recorded information has been reviewed and is accurate.       Ripley Fraise, MD 12/13/15 [phone]

## 2015-12-17 ENCOUNTER — Emergency Department (HOSPITAL_COMMUNITY)
Admission: EM | Admit: 2015-12-17 | Discharge: 2015-12-17 | Disposition: A | Discharge status: Home / Self Care | Payer: Medicaid Other | Attending: Emergency Medicine | Admitting: Emergency Medicine

## 2015-12-17 ENCOUNTER — Encounter (HOSPITAL_COMMUNITY): Payer: Self-pay | Admitting: *Deleted

## 2015-12-17 DIAGNOSIS — Z7984 Long term (current) use of oral hypoglycemic drugs: Secondary | ICD-10-CM | POA: Diagnosis not present

## 2015-12-17 DIAGNOSIS — R1030 Lower abdominal pain, unspecified: Secondary | ICD-10-CM

## 2015-12-17 DIAGNOSIS — F17210 Nicotine dependence, cigarettes, uncomplicated: Secondary | ICD-10-CM | POA: Insufficient documentation

## 2015-12-17 DIAGNOSIS — E119 Type 2 diabetes mellitus without complications: Secondary | ICD-10-CM | POA: Diagnosis not present

## 2015-12-17 DIAGNOSIS — Z79899 Other long term (current) drug therapy: Secondary | ICD-10-CM | POA: Diagnosis not present

## 2015-12-17 MED ORDER — NAPROXEN 500 MG PO TABS: 500.0000 mg | ORAL_TABLET | Freq: Two times a day (BID) | ORAL | 0 refills | Status: DC

## 2015-12-17 NOTE — ED Provider Notes (Signed)
[facility] DEPT Provider Note   CSN: [id number] Arrival date & time: 12/17/15  1150     History   Chief Complaint Chief Complaint  Patient presents with  . Abdominal Pain    HPI [name] is a 25 y.o. female.  Patient presents today with lower abdominal pain.  She reports that the pain is in the location of her previous c section scar.  She states that the pain has been present intermittently over the past six months.  She has had several ED visits for the same pain in the past.  She had a pelvic ultrasound in June and a CT ab/pelvis done in March for this pain, which was unremarkable.  She has been taking Naproxen for the pain, which she reports has helped.  She is requesting another Rx for Naproxen.  She does report loose stool for the past couple of days.  She denies nausea, vomiting, fever, chills, vaginal discharge, or any other symptoms.  She reports that she has an OB/GYN appointment in a few days.      Past Medical History:  Diagnosis Date  . Anxiety   . Bipolar 1 disorder ([place])   . Cognitive deficits   . Depression   . Diabetes mellitus without complication ([place])   . Mental disorder   . Obesity     Patient Active Problem List   Diagnosis Date Noted  . 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 Learning disability 07/18/2015  . 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 Diabetes mellitus ([place]) 07/18/2015  . 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 Hyperlipidemia 07/18/2015  . Drug overdose   . Cognitive deficits 10/12/2012  . Generalized anxiety disorder 06/28/2012    Past Surgical History:  Procedure Laterality Date  . CESAREAN SECTION    . CESAREAN SECTION N/A 04/25/2013   Procedure: REPEAT CESAREAN SECTION;  Surgeon: [name], MD;  Location: [facility];  Service: Obstetrics;  Laterality: N/A;  . MASS EXCISION N/A 06/03/2012   Procedure: EXCISION MASS;  Surgeon: [name], MD;  Location:  [facility];  Service: ENT;  Laterality: N/A;  Excision uvula mass  . TONSILLECTOMY N/A 06/03/2012   Procedure: TONSILLECTOMY;  Surgeon: [name], MD;  Location: [facility];  Service: ENT;  Laterality: N/A;  . TONSILLECTOMY      OB History    Gravida Para Term Preterm AB Living   3 3 3  0 0 3   SAB TAB Ectopic Multiple Live Births   0 0 0 0 3       Home Medications    Prior to Admission medications   Medication Sig Start Date End Date Taking? Authorizing Provider  busPIRone (BUSPAR) 15 MG tablet Take 15 mg by mouth daily.    Historical Provider, MD  diphenhydrAMINE (BENADRYL) 25 mg capsule Take 1 capsule (25 mg total) by mouth every 6 (six) hours as needed. 11/21/15   Merryl Hacker, MD  DULoxetine (CYMBALTA) 60 MG capsule Take 60 mg by mouth daily. 09/25/15   Historical Provider, MD  DULoxetine (CYMBALTA) 60 MG capsule Take 60 mg by mouth daily.    Historical Provider, MD  fluvoxaMINE (LUVOX) 100 MG tablet Take 100 mg by mouth at bedtime.    Historical Provider, MD  fluvoxaMINE (LUVOX) 25 MG tablet Take 3 tablets (75 mg total) by mouth daily with breakfast. Patient taking differently: Take 100 mg by mouth daily with breakfast.  07/23/15   Kerrie Buffalo, NP  glipiZIDE (GLUCOTROL) 10 MG tablet Take 10 mg by mouth daily before breakfast.    Historical Provider, MD  hydrOXYzine (ATARAX/VISTARIL) 25 MG tablet Take 1 tablet (25 mg total) by mouth every 6 (six) hours. 11/30/15   Ozella Almond Ward, PA-C  metFORMIN (GLUCOPHAGE) 500 MG tablet Take 500 mg by mouth 2 (two) times daily with a meal.    Historical Provider, MD  paliperidone (INVEGA SUSTENNA) 234 MG/1.5ML SUSP injection Inject 234 mg into the muscle every 30 (thirty) days.    Historical Provider, MD    Family History Family History  Problem Relation Age of Onset  . Hypertension Mother   . Diabetes Father     Social History Social History  Substance Use Topics  . Smoking status: Current  Every Day Smoker    Packs/day: 1.00    Years: 7.00    Types: Cigarettes    Last attempt to quit: 06/30/2015  . Smokeless tobacco: Never Used  . Alcohol use No     Allergies   Omnipaque [iohexol] and Penicillins   Review of Systems Review of Systems  All other systems reviewed and are negative.    Physical Exam Updated Vital Signs BP 145/89 (BP Location: Left Arm)   Pulse 113   Temp 99 F (37.2 C) (Oral)   Resp 18   Ht 5\' 7"  (1.702 m)   Wt 126.1 kg   LMP  (LMP Unknown)   SpO2 96%   BMI 43.54 kg/m   Physical Exam  Constitutional: She appears well-developed and well-nourished.  HENT:  Head: Normocephalic and atraumatic.  Mouth/Throat: Oropharynx is clear and moist.  Neck: Normal range of motion. Neck supple.  Cardiovascular: Normal rate, regular rhythm and normal heart sounds.   Pulmonary/Chest: Effort normal and breath sounds normal.  Abdominal: Soft. Bowel sounds are normal. She exhibits no distension and no mass. There is tenderness in the suprapubic area. There is no rebound and no guarding. No hernia.  Old c-section scar.  No erythema, edema, or warmth in the area of the scar  Musculoskeletal: Normal range of motion.  Neurological: She is alert.  Skin: Skin is warm and dry.  Psychiatric: She has a normal mood and affect.  Nursing note and vitals reviewed.    ED Treatments / Results  Labs (all labs ordered are listed, but only abnormal results are displayed) Labs Reviewed - No data to display  EKG  EKG Interpretation None       Radiology No results found.  Procedures Procedures (including critical care time)  Medications Ordered in ED Medications - No data to display   Initial Impression / Assessment and Plan / ED Course  I have reviewed the triage vital signs and the nursing notes.  Pertinent labs & imaging results that were available during my care of the patient were reviewed by me and considered in my medical decision making (see chart  for details).  Clinical Course     Final Clinical Impressions(s) / ED Diagnoses   Final diagnoses:  None   Patient presents today with a chief complaint of lower abdominal pain that has been present intermittently over the past six months.  Patient did not want labs today because "it never shows anything."  She has had several ED visits for this pain in the past.  Labs from five days ago reviewed and were unremarkable.  She is just requesting a Rx for Naproxen because she ran out.  Patient given Rx, but also informed that it is OTC.  She has an appointment scheduled already with OB/GYN.  Stable for discharge.  Return precautions given.   New Prescriptions New Prescriptions   No medications on file     Hyman Bible, PA-C 12/19/15 [place], MD 12/21/15 1745

## 2015-12-17 NOTE — ED Triage Notes (Signed)
Pt complains of pain in her lower abdomen for the past year, pt states she is here today because she she is running out of naproxen and is also concerned she has infection where she had c-section 3 years ago. Pt states the naproxen has helped with her pain. Pt denies nausea or vomiting. Pt states she has had diarrhea, which is not new.

## 2015-12-20 ENCOUNTER — Emergency Department (HOSPITAL_COMMUNITY)
Admission: EM | Admit: 2015-12-20 | Discharge: 2015-12-21 | Disposition: A | Discharge status: Home / Self Care | Payer: Medicaid Other | Attending: Emergency Medicine | Admitting: Emergency Medicine

## 2015-12-20 DIAGNOSIS — Z79899 Other long term (current) drug therapy: Secondary | ICD-10-CM | POA: Diagnosis not present

## 2015-12-20 DIAGNOSIS — F17210 Nicotine dependence, cigarettes, uncomplicated: Secondary | ICD-10-CM | POA: Insufficient documentation

## 2015-12-20 DIAGNOSIS — M25562 Pain in left knee: Secondary | ICD-10-CM | POA: Diagnosis not present

## 2015-12-20 DIAGNOSIS — R1030 Lower abdominal pain, unspecified: Secondary | ICD-10-CM | POA: Insufficient documentation

## 2015-12-20 DIAGNOSIS — E119 Type 2 diabetes mellitus without complications: Secondary | ICD-10-CM | POA: Insufficient documentation

## 2015-12-20 DIAGNOSIS — R109 Unspecified abdominal pain: Secondary | ICD-10-CM

## 2015-12-20 DIAGNOSIS — Z7984 Long term (current) use of oral hypoglycemic drugs: Secondary | ICD-10-CM | POA: Diagnosis not present

## 2015-12-20 NOTE — ED Notes (Signed)
Bed: [facility]EMS01 Expected date:  Expected time:  Means of arrival:  Comments: 25yo F generalized pain/abdominal pain.

## 2015-12-20 NOTE — ED Triage Notes (Signed)
Pt transported from home by EMS with c/o lower abd pain x 1 year. Pain is near incision from [place] 3 years ago.

## 2015-12-21 ENCOUNTER — Encounter (HOSPITAL_COMMUNITY): Payer: Self-pay | Admitting: *Deleted

## 2015-12-21 ENCOUNTER — Emergency Department (HOSPITAL_COMMUNITY)
Admission: EM | Admit: 2015-12-21 | Discharge: 2015-12-22 | Disposition: A | Discharge status: Home / Self Care | Payer: Medicaid Other | Source: Home / Self Care | Attending: Emergency Medicine | Admitting: Emergency Medicine

## 2015-12-21 ENCOUNTER — Encounter (HOSPITAL_COMMUNITY): Payer: Self-pay | Admitting: Emergency Medicine

## 2015-12-21 DIAGNOSIS — R1084 Generalized abdominal pain: Secondary | ICD-10-CM

## 2015-12-21 DIAGNOSIS — I10 Essential (primary) hypertension: Secondary | ICD-10-CM

## 2015-12-21 DIAGNOSIS — E119 Type 2 diabetes mellitus without complications: Secondary | ICD-10-CM

## 2015-12-21 DIAGNOSIS — F17210 Nicotine dependence, cigarettes, uncomplicated: Secondary | ICD-10-CM

## 2015-12-21 DIAGNOSIS — Z7984 Long term (current) use of oral hypoglycemic drugs: Secondary | ICD-10-CM

## 2015-12-21 DIAGNOSIS — Z79899 Other long term (current) drug therapy: Secondary | ICD-10-CM | POA: Insufficient documentation

## 2015-12-21 LAB — BASIC METABOLIC PANEL
ANION GAP: 6 (ref 5–15)
BUN: 13 mg/dL (ref 6–20)
CHLORIDE: 107 mmol/L (ref 101–111)
CO2: 27 mmol/L (ref 22–32)
Calcium: 9 mg/dL (ref 8.9–10.3)
Creatinine, Ser: 0.83 mg/dL (ref 0.44–1.00)
GFR calc non Af Amer: >60 mL/min (ref >60)
GLUCOSE: 113 mg/dL — AB (ref 65–99)
POTASSIUM: 3.5 mmol/L (ref 3.5–5.1)
Sodium: 140 mmol/L (ref 135–145)

## 2015-12-21 LAB — COMPREHENSIVE METABOLIC PANEL
ALBUMIN: 4 g/dL (ref 3.5–5.0)
ALK PHOS: 48 U/L (ref 38–126)
ALT: 34 U/L (ref 14–54)
ANION GAP: 5 (ref 5–15)
AST: 30 U/L (ref 15–41)
BILIRUBIN TOTAL: 0.7 mg/dL (ref 0.3–1.2)
BUN: 11 mg/dL (ref 6–20)
CALCIUM: 9.3 mg/dL (ref 8.9–10.3)
CO2: 27 mmol/L (ref 22–32)
Chloride: 108 mmol/L (ref 101–111)
Creatinine, Ser: 0.93 mg/dL (ref 0.44–1.00)
GFR calc non Af Amer: >60 mL/min (ref >60)
GLUCOSE: 83 mg/dL (ref 65–99)
POTASSIUM: 3.9 mmol/L (ref 3.5–5.1)
SODIUM: 140 mmol/L (ref 135–145)
TOTAL PROTEIN: 7.1 g/dL (ref 6.5–8.1)

## 2015-12-21 LAB — CBC
HEMATOCRIT: 38.1 % (ref 36.0–46.0)
HEMATOCRIT: 40.5 % (ref 36.0–46.0)
HEMOGLOBIN: 12.3 g/dL (ref 12.0–15.0)
HEMOGLOBIN: 12.6 g/dL (ref 12.0–15.0)
MCH: 26.3 pg (ref 26.0–34.0)
MCH: 25.6 pg — ABNORMAL LOW (ref 26.0–34.0)
MCHC: 32.3 g/dL (ref 30.0–36.0)
MCHC: 31.1 g/dL (ref 30.0–36.0)
MCV: 81.4 fL (ref 78.0–100.0)
MCV: 82.2 fL (ref 78.0–100.0)
Platelets: 216 10*3/uL (ref 150–400)
Platelets: 217 10*3/uL (ref 150–400)
RBC: 4.68 MIL/uL (ref 3.87–5.11)
RBC: 4.93 MIL/uL (ref 3.87–5.11)
RDW: 14.9 % (ref 11.5–15.5)
RDW: 14.8 % (ref 11.5–15.5)
WBC: 9.6 10*3/uL (ref 4.0–10.5)
WBC: 8.5 10*3/uL (ref 4.0–10.5)

## 2015-12-21 LAB — URINALYSIS, ROUTINE W REFLEX MICROSCOPIC
Glucose, UA: NEGATIVE mg/dL
Hgb urine dipstick: NEGATIVE
Ketones, ur: NEGATIVE mg/dL
Leukocytes, UA: NEGATIVE
NITRITE: NEGATIVE
PH: 6 (ref 5.0–8.0)
Protein, ur: NEGATIVE mg/dL
SPECIFIC GRAVITY, URINE: 1.022 (ref 1.005–1.030)

## 2015-12-21 LAB — LIPASE, BLOOD: Lipase: 46 U/L (ref 11–51)

## 2015-12-21 LAB — CBG MONITORING, ED: Glucose-Capillary: 92 mg/dL (ref 65–99)

## 2015-12-21 MED ORDER — KETOROLAC TROMETHAMINE 30 MG/ML IJ SOLN
30.0000 mg | Freq: Once | INTRAMUSCULAR | Status: AC
  Administered 2015-12-21: 30 mg via INTRAMUSCULAR
  Filled 2015-12-21: qty 1

## 2015-12-21 MED ORDER — KETOROLAC TROMETHAMINE 10 MG PO TABS
10.0000 mg | ORAL_TABLET | Freq: Once | ORAL | Status: AC
  Administered 2015-12-21: 10 mg via ORAL
  Filled 2015-12-21: qty 1

## 2015-12-21 MED ORDER — ONDANSETRON 4 MG PO TBDP
4.0000 mg | ORAL_TABLET | Freq: Once | ORAL | Status: AC
  Administered 2015-12-21: 4 mg via ORAL
  Filled 2015-12-21: qty 1

## 2015-12-21 MED ORDER — KETOROLAC TROMETHAMINE 30 MG/ML IJ SOLN: 30.0000 mg | Freq: Once | INTRAMUSCULAR | Status: DC

## 2015-12-21 MED ORDER — NAPROXEN 500 MG PO TABS: 500.0000 mg | ORAL_TABLET | Freq: Two times a day (BID) | ORAL | 0 refills | Status: DC

## 2015-12-21 MED ORDER — ONDANSETRON HCL 4 MG/2ML IJ SOLN: 4.0000 mg | Freq: Once | INTRAMUSCULAR | Status: DC

## 2015-12-21 NOTE — ED Notes (Signed)
Pt asked for food and drink and bus pass prior to discharge

## 2015-12-21 NOTE — ED Triage Notes (Signed)
Per EMS: pt c/o abdominal pain for a year. Pt was seen at [facility] last night. Pt not satisfied because she continues to have abdominal pain. Pt A&Ox4, respirations equal and unlabored, skin warm and dry. Pt also c/o bilateral lower leg pain and weakness

## 2015-12-21 NOTE — ED Notes (Signed)
Pt left before receiving discharge instructions or signing

## 2015-12-21 NOTE — ED Notes (Signed)
Cab voucher given to ensure safe transport home.

## 2015-12-21 NOTE — ED Notes (Addendum)
RN asked several times for pt to call family/friend; pt replied with "my people dont care for me like that"; No bus passes available therefore cab voucher provided

## 2015-12-21 NOTE — ED Provider Notes (Signed)
[facility] DEPT Provider Note   CSN: [id number] Arrival date & time: 12/20/15  2336     History   Chief Complaint Chief Complaint  Patient presents with  . Abdominal Pain    HPI [name] is a 25 y.o. female.  HPI  25 y.o. female with a hx of Depression, BPD, Anxiety, presents to the Emergency Department today complaining of lower abdominal pain around location of previous C Section scar. Seen multiple times in ED for same. Seen on 12-17-15 for same. Notes pain intermittently getting worse despite using Naprosyn. States pain still feels similar x 1 year ago. Has had pelvic ultrasound done in June as well as CT ABD/Pelvis in March for same pain. Pain is 6/10. No vaginal bleeding. No discharge. No dysuria. No fevers. No N/V/D. No CP/SOB. NO diaphoresis. Pt states that she also started to have left knee pain x several days. Pain on ROM. No recent falls or trauma. Pt has OB/GYN appointment on the 1st of next month for evaluation of abdominal pain. No other symptoms noted.   Past Medical History:  Diagnosis Date  . Anxiety   . Bipolar 1 disorder ([place])   . Cognitive deficits   . Depression   . Diabetes mellitus without complication ([place])   . Mental disorder   . Obesity     Patient Active Problem List   Diagnosis Date Noted  . 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 Learning disability 07/18/2015  . 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 Diabetes mellitus ([place]) 07/18/2015  . 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 Hyperlipidemia 07/18/2015  . Drug overdose   . Cognitive deficits 10/12/2012  . Generalized anxiety disorder 06/28/2012    Past Surgical History:  Procedure Laterality Date  . CESAREAN SECTION    . CESAREAN SECTION N/A 04/25/2013   Procedure: REPEAT CESAREAN SECTION;  Surgeon: [name], MD;  Location: [facility];  Service: Obstetrics;  Laterality: N/A;  . MASS EXCISION N/A 06/03/2012   Procedure:  EXCISION MASS;  Surgeon: [name], MD;  Location: [facility];  Service: ENT;  Laterality: N/A;  Excision uvula mass  . TONSILLECTOMY N/A 06/03/2012   Procedure: TONSILLECTOMY;  Surgeon: [name], MD;  Location: [facility];  Service: ENT;  Laterality: N/A;  . TONSILLECTOMY      OB History    Gravida Para Term Preterm AB Living   3 3 3  0 0 3   SAB TAB Ectopic Multiple Live Births   0 0 0 0 3       Home Medications    Prior to Admission medications   Medication Sig Start Date End Date Taking? Authorizing Provider  busPIRone (BUSPAR) 15 MG tablet Take 15 mg by mouth daily.    Historical Provider, MD  diphenhydrAMINE (BENADRYL) 25 mg capsule Take 1 capsule (25 mg total) by mouth every 6 (six) hours as needed. 11/21/15   Merryl Hacker, MD  DULoxetine (CYMBALTA) 60 MG capsule Take 60 mg by mouth daily. 09/25/15   Historical Provider, MD  DULoxetine (CYMBALTA) 60 MG capsule Take 60 mg by mouth daily.    Historical Provider, MD  fluvoxaMINE (LUVOX) 100 MG tablet Take 100 mg by mouth at bedtime.    Historical Provider, MD  fluvoxaMINE (LUVOX) 25 MG tablet Take 3 tablets (75 mg total) by mouth daily with breakfast. Patient taking differently: Take 100 mg by mouth daily with breakfast.  07/23/15   Kerrie Buffalo, NP  glipiZIDE (GLUCOTROL) 10 MG tablet Take 10 mg by mouth daily before breakfast.    Historical Provider, MD  hydrOXYzine (ATARAX/VISTARIL) 25 MG tablet Take 1 tablet (25 mg total) by mouth every 6 (six) hours. 11/30/15   Ozella Almond Ward, PA-C  metFORMIN (GLUCOPHAGE) 500 MG tablet Take 500 mg by mouth 2 (two) times daily with a meal.    Historical Provider, MD  naproxen (NAPROSYN) 500 MG tablet Take 1 tablet (500 mg total) by mouth 2 (two) times daily. 12/17/15   Hyman Bible, PA-C  paliperidone (INVEGA SUSTENNA) 234 MG/1.5ML SUSP injection Inject 234 mg into the muscle every 30 (thirty) days.    Historical Provider, MD    Family  History Family History  Problem Relation Age of Onset  . Hypertension Mother   . Diabetes Father     Social History Social History  Substance Use Topics  . Smoking status: Current Every Day Smoker    Packs/day: 1.00    Years: 7.00    Types: Cigarettes    Last attempt to quit: 06/30/2015  . Smokeless tobacco: Never Used  . Alcohol use No     Allergies   Omnipaque [iohexol] and Penicillins   Review of Systems Review of Systems ROS reviewed and all are negative for acute change except as noted in the HPI.    Physical Exam Updated Vital Signs BP 135/95 (BP Location: Right Arm)   Pulse 96   Temp 98.6 F (37 C) (Oral)   Resp 18   Ht 5\' 7"  (1.702 m)   Wt 126.1 kg   LMP  (LMP Unknown)   SpO2 100%   BMI 43.54 kg/m   Physical Exam  Constitutional: She is oriented to person, place, and time. Vital signs are normal. She appears well-developed and well-nourished.  HENT:  Head: Normocephalic.  Right Ear: Hearing normal.  Left Ear: Hearing normal.  Eyes: Conjunctivae and EOM are normal. Pupils are equal, round, and reactive to light.  Neck: Normal range of motion. Neck supple.  Cardiovascular: Normal rate, regular rhythm, normal heart sounds and intact distal pulses.   Pulmonary/Chest: Effort normal and breath sounds normal.  Abdominal: Soft. Normal appearance and bowel sounds are normal. There is no tenderness. There is no rigidity, no rebound, no guarding, no CVA tenderness, no tenderness at McBurney's point and negative Murphy's sign.  Abdomen soft.  Musculoskeletal:  Left Knee Negative anterior/poster drawer bilaterally. Negative ballottement test. No varus or valgus laxity. No crepitus. No pain with flexion or extension. TTP of knees lateral aspect of knee along LCL.   Neurological: She is alert and oriented to person, place, and time.  Skin: Skin is warm and dry.  Psychiatric: She has a normal mood and affect. Her speech is normal and behavior is normal. Thought  content normal.   ED Treatments / Results  Labs (all labs ordered are listed, but only abnormal results are displayed) Labs Reviewed - No data to display  EKG  EKG Interpretation None       Radiology No results found.  Procedures Procedures (including critical care time)  Medications Ordered in ED Medications - No data to display   Initial Impression / Assessment and Plan / ED Course  I have reviewed the triage vital signs and the nursing notes.  Pertinent labs & imaging results that were available during my care of the patient were reviewed by me and considered in my medical decision making (see chart for details).  Clinical Course    Final Clinical  Impressions(s) / ED Diagnoses  I have reviewed and evaluated the relevant laboratory valuesI have reviewed and evaluated the relevant imaging studies. I have reviewed the relevant previous healthcare records.I obtained HPI from historian.  ED Course:  Assessment: Pt is a 24yF with hx Depression, BPD, Anxiety, and multiple ED visits who presents with abdominal pain as well as leg pain. On exam, pt in NAD. Nontoxic/nonseptic appearing. VSS. Afebrile. Lungs CTA. Heart RRR. Abdomen nontender soft. Labs unremarkable. I do no suspect acute intraabdominal pathology. Symptoms x 1 year. Has follow up with OBGYN on the 1st for further evaluation. Given toradol in ED with improvement of symptoms. Left knee pain likely from pt weight status. She is TTP on lateral aspect. Possible ligamentous strain. Plan is to [facility] with follow up to PCP. At time of discharge, Patient is in no acute distress. Vital Signs are stable. Patient is able to ambulate. Patient able to tolerate PO.   Disposition/Plan:  DC [facility] Additional Verbal discharge instructions given and discussed with patient.  Pt Instructed to f/u with PCP in the next week for evaluation and treatment of symptoms. Return precautions given Pt acknowledges and agrees with  plan  Supervising Physician Jola Schmidt, MD   Final diagnoses:  Abdominal pain, unspecified abdominal location    New Prescriptions New Prescriptions   No medications on file     [name], PA-C 12/21/15 [place], MD 12/21/15 [phone]

## 2015-12-21 NOTE — Discharge Instructions (Signed)
Please read and follow all provided instructions.  Your diagnoses today include:  1. Abdominal pain, unspecified abdominal location     Tests performed today include: Blood counts and electrolytes Blood tests to check liver and kidney function Blood tests to check pancreas function Urine test to look for infection and pregnancy (in women) Vital signs. See below for your results today.   Medications prescribed:   Take any prescribed medications only as directed.  Home care instructions:  Follow any educational materials contained in this packet.  Follow-up instructions: Please follow-up with your primary care provider in the next 2 days for further evaluation of your symptoms.    Return instructions:  SEEK IMMEDIATE MEDICAL ATTENTION IF: The pain does not go away or becomes severe  A temperature above 101F develops  Repeated vomiting occurs (multiple episodes)  The pain becomes localized to portions of the abdomen. The right side could possibly be appendicitis. In an adult, the left lower portion of the abdomen could be colitis or diverticulitis.  Blood is being passed in stools or vomit (bright red or black tarry stools)  You develop chest pain, difficulty breathing, dizziness or fainting, or become confused, poorly responsive, or inconsolable (young children) If you have any other emergent concerns regarding your health  Additional Information: Abdominal (belly) pain can be caused by many things. Your caregiver performed an examination and possibly ordered blood/urine tests and imaging (CT scan, x-rays, ultrasound). Many cases can be observed and treated at home after initial evaluation in the emergency department. Even though you are being discharged home, abdominal pain can be unpredictable. Therefore, you need a repeated exam if your pain does not resolve, returns, or worsens. Most patients with abdominal pain don't have to be admitted to the hospital or have surgery, but  serious problems like appendicitis and gallbladder attacks can start out as nonspecific pain. Many abdominal conditions cannot be diagnosed in one visit, so follow-up evaluations are very important.  Your vital signs today were: BP 119/56 (BP Location: Left Arm)    Pulse 103    Temp 98.2 F (36.8 C) (Oral)    Resp 19    Ht 5\' 7"  (1.702 m)    Wt 126.1 kg    LMP  (LMP Unknown)    SpO2 97%    BMI 43.54 kg/m  If your blood pressure (bp) was elevated above 135/85 this visit, please have this repeated by your doctor within one month. --------------

## 2015-12-21 NOTE — ED Triage Notes (Signed)
Pt reports similar c/o per EMS. Pt also states she is not eating the way she usually does. Pt has lost 18 lbs in a 2 month time span. Pt denies n/v/d

## 2015-12-24 ENCOUNTER — Encounter (HOSPITAL_COMMUNITY): Payer: Self-pay | Admitting: Nurse Practitioner

## 2015-12-24 ENCOUNTER — Emergency Department (HOSPITAL_COMMUNITY)
Admission: EM | Admit: 2015-12-24 | Discharge: 2015-12-24 | Disposition: A | Discharge status: Home / Self Care | Payer: Medicaid Other | Attending: Emergency Medicine | Admitting: Emergency Medicine

## 2015-12-24 DIAGNOSIS — F17210 Nicotine dependence, cigarettes, uncomplicated: Secondary | ICD-10-CM | POA: Diagnosis not present

## 2015-12-24 DIAGNOSIS — R5383 Other fatigue: Secondary | ICD-10-CM | POA: Insufficient documentation

## 2015-12-24 DIAGNOSIS — E119 Type 2 diabetes mellitus without complications: Secondary | ICD-10-CM | POA: Insufficient documentation

## 2015-12-24 DIAGNOSIS — Z79899 Other long term (current) drug therapy: Secondary | ICD-10-CM | POA: Insufficient documentation

## 2015-12-24 DIAGNOSIS — R109 Unspecified abdominal pain: Secondary | ICD-10-CM | POA: Diagnosis present

## 2015-12-24 DIAGNOSIS — Z7984 Long term (current) use of oral hypoglycemic drugs: Secondary | ICD-10-CM | POA: Insufficient documentation

## 2015-12-24 LAB — URINALYSIS, ROUTINE W REFLEX MICROSCOPIC
Glucose, UA: NEGATIVE mg/dL
Hgb urine dipstick: NEGATIVE
KETONES UR: NEGATIVE mg/dL
Leukocytes, UA: NEGATIVE
NITRITE: NEGATIVE
PROTEIN: NEGATIVE mg/dL
Specific Gravity, Urine: 1.028 (ref 1.005–1.030)
pH: 5.5 (ref 5.0–8.0)

## 2015-12-24 LAB — PREGNANCY, URINE: Preg Test, Ur: NEGATIVE

## 2015-12-24 MED ORDER — LIDOCAINE 5 % EX PTCH
1.0000 | MEDICATED_PATCH | CUTANEOUS | Status: DC
  Administered 2015-12-24: 1 via TRANSDERMAL
  Filled 2015-12-24: qty 1

## 2015-12-24 MED ORDER — ACETAMINOPHEN 500 MG PO TABS
1000.0000 mg | ORAL_TABLET | Freq: Once | ORAL | Status: AC
  Administered 2015-12-24: 1000 mg via ORAL
  Filled 2015-12-24: qty 2

## 2015-12-24 MED ORDER — LIDOCAINE 5 % EX PTCH: 1.0000 | MEDICATED_PATCH | CUTANEOUS | 0 refills | Status: DC

## 2015-12-24 NOTE — ED Triage Notes (Addendum)
Pt states she is sleeping too much, all day and night and she is having abdominal pain 8/10 and has no appetite.

## 2015-12-24 NOTE — ED Notes (Signed)
PT reporting increasing fatigue and also abd pain for the last year worsening over the last few days. PT reporting painful intercourse. Advised she has OB appt on 12/27/15. Tender on palpation to RLQ, LLQ and Suprapubic area along c-section scar.

## 2015-12-24 NOTE — ED Provider Notes (Signed)
[facility] DEPT Provider Note   CSN: [id number] Arrival date & time: 12/24/15  0121  By signing my name below, I, [name], attest that this documentation has been prepared under the direction and in the presence of Kieu Quiggle, MD. Electronically Signed: [name], ED Scribe. 12/24/15. 3:36 AM.  History   Chief Complaint Chief Complaint  Patient presents with  . Fatigue  . Abdominal Pain   The history is provided by the patient and medical records. No language interpreter was used.  Abdominal Pain   The current episode started more than 1 week ago. The problem occurs constantly. The problem has not changed since onset.The pain is associated with a previous surgery. Quality: at her C section scar. Pertinent negatives include fever, diarrhea, nausea, vomiting, constipation, dysuria and headaches. Nothing aggravates the symptoms. Nothing relieves the symptoms. Past workup includes CT scan. Her past medical history does not include ulcerative colitis.   HPI Comments: KARISS RUPAR is a 25 y.o. female with a PMHx of anxiety, Bipolar 1 disorder, depression, DM, and HLD, who presents to the Emergency Department complaining of gradual onset, gradually worsening, constant, 8/10 lower abdominal pain surrounding her C-section scaronset PTA. She reports associated fatigue over the past several days. She has been seen several times over the past ~2 weeks for same, w/ imaging and lab workups that were all unremarkable. Her pain is mildly exacerbated with sitting forward. No alleviating factors noted. She has an appointment with the [facility] for f/u care on 12/27/15. Pt denies diarrhea, constipation, or any other associated symptoms.   PCP: [facility]  Past Medical History:  Diagnosis Date  . Anxiety   . Bipolar 1 disorder ([place])   . Cognitive deficits   . Depression   . Diabetes mellitus without complication ([place])   . Mental disorder   . Obesity    Patient  Active Problem List   Diagnosis Date Noted  . 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 Learning disability 07/18/2015  . 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 Diabetes mellitus ([place]) 07/18/2015  . 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 Hyperlipidemia 07/18/2015  . Drug overdose   . Cognitive deficits 10/12/2012  . Generalized anxiety disorder 06/28/2012   Past Surgical History:  Procedure Laterality Date  . CESAREAN SECTION    . CESAREAN SECTION N/A 04/25/2013   Procedure: REPEAT CESAREAN SECTION;  Surgeon: [name], MD;  Location: [facility];  Service: Obstetrics;  Laterality: N/A;  . MASS EXCISION N/A 06/03/2012   Procedure: EXCISION MASS;  Surgeon: [name], MD;  Location: [facility];  Service: ENT;  Laterality: N/A;  Excision uvula mass  . TONSILLECTOMY N/A 06/03/2012   Procedure: TONSILLECTOMY;  Surgeon: [name], MD;  Location: [facility];  Service: ENT;  Laterality: N/A;  . TONSILLECTOMY     OB History    Gravida Para Term Preterm AB Living   3 3 3  0 0 3   SAB TAB Ectopic Multiple Live Births   0 0 0 0 3     Home Medications    Prior to Admission medications   Medication Sig Start Date End Date Taking? Authorizing Provider  busPIRone (BUSPAR) 15 MG tablet Take 15 mg by mouth daily.    Historical Provider, MD  diphenhydrAMINE (BENADRYL) 25 mg capsule Take 1 capsule (25 mg total) by mouth every 6 (six) hours as needed. Patient not taking: Reported on 12/21/2015 11/21/15   Loma Sousa  F Horton, MD  DULoxetine (CYMBALTA) 60 MG capsule Take 60 mg by mouth daily. 09/25/15   Historical Provider, MD  fluvoxaMINE (LUVOX) 100 MG tablet Take 100 mg by mouth at bedtime.    Historical Provider, MD  fluvoxaMINE (LUVOX) 25 MG tablet Take 3 tablets (75 mg total) by mouth daily with breakfast. Patient not taking: Reported on 12/21/2015 07/23/15   Kerrie Buffalo, NP  GLIPIZIDE XL 10 MG 24 hr  tablet Take 10 mg by mouth daily. 11/19/15   Historical Provider, MD  hydrOXYzine (ATARAX/VISTARIL) 25 MG tablet Take 1 tablet (25 mg total) by mouth every 6 (six) hours. Patient not taking: Reported on 12/21/2015 11/30/15   [facility] Ward, PA-C  metFORMIN (GLUCOPHAGE) 500 MG tablet Take 500 mg by mouth 2 (two) times daily with a meal.    Historical Provider, MD  naproxen (NAPROSYN) 500 MG tablet Take 1 tablet (500 mg total) by mouth 2 (two) times daily. 12/21/15   [name], PA-C  paliperidone (INVEGA SUSTENNA) 234 MG/1.5ML SUSP injection Inject 234 mg into the muscle every 30 (thirty) days.    Historical Provider, MD   Family History Family History  Problem Relation Age of Onset  . Hypertension Mother   . Diabetes Father    Social History Social History  Substance Use Topics  . Smoking status: Current Every Day Smoker    Packs/day: 1.00    Years: 7.00    Types: Cigarettes    Last attempt to quit: 06/30/2015  . Smokeless tobacco: Never Used  . Alcohol use No   Allergies   Omnipaque [iohexol] and Penicillins  Review of Systems Review of Systems  Constitutional: Positive for fatigue. Negative for chills and fever.  HENT: Negative for congestion, sore throat and voice change.   Respiratory: Negative for cough and shortness of breath.   Cardiovascular: Negative for chest pain.  Gastrointestinal: Positive for abdominal pain. Negative for blood in stool, constipation, diarrhea, nausea, rectal pain and vomiting.  Genitourinary: Negative for dysuria.  Neurological: Negative for dizziness, seizures, syncope, facial asymmetry, speech difficulty, weakness, light-headedness, numbness and headaches.  All other systems reviewed and are negative.  Physical Exam Updated Vital Signs BP 133/87 (BP Location: Left Arm)   Pulse 96   Temp 99 F (37.2 C) (Oral)   Resp 14   Ht 5\' 7"  (1.702 m)   Wt 260 lb (117.9 kg)   LMP  (LMP Unknown)   SpO2 95%   BMI 40.72 kg/m   Physical Exam    Constitutional: She appears well-developed and well-nourished. No distress.  HENT:  Head: Normocephalic.  Mouth/Throat: Oropharynx is clear and moist. No oropharyngeal exudate.  Eyes: Conjunctivae and EOM are normal. Pupils are equal, round, and reactive to light. Right eye exhibits no discharge. Left eye exhibits no discharge. No scleral icterus.  Neck: Normal range of motion. Neck supple. No JVD present. No tracheal deviation present.  Trachea is midline. No stridor or carotid bruits.   Cardiovascular: Normal rate, regular rhythm, normal heart sounds and intact distal pulses.   No murmur heard. Pulmonary/Chest: Effort normal and breath sounds normal. No stridor. No respiratory distress. She has no wheezes. She has no rales.  Lungs CTA bilaterally.  Abdominal: Soft. Bowel sounds are normal. She exhibits no distension and no mass. There is no tenderness. There is no rebound and no guarding.  Musculoskeletal: Normal range of motion. She exhibits no edema, tenderness or deformity.  Lymphadenopathy:    She has no cervical adenopathy.  Neurological: She  is alert. She has normal reflexes. She displays normal reflexes. She exhibits normal muscle tone.  Skin: Skin is warm and dry. Capillary refill takes less than 2 seconds. She is not diaphoretic.  No lesions or wounds.   Psychiatric: She has a normal mood and affect. Her behavior is normal.  Nursing note and vitals reviewed.  ED Treatments / Results  DIAGNOSTIC STUDIES: Oxygen Saturation is 95% on RA, adequate by my interpretation.   COORDINATION OF CARE: 3:36 AM-Discussed next steps with pt. Pt verbalized understanding and is agreeable with the plan.   Labs (all labs ordered are listed, but only abnormal results are displayed) Labs Reviewed  URINALYSIS, ROUTINE W REFLEX MICROSCOPIC (NOT AT [facility]) - Abnormal; Notable for the following:       Result Value   Bilirubin Urine SMALL (*)    All other components within normal limits   PREGNANCY, URINE    EKG  EKG Interpretation None       Radiology No results found.  Procedures Procedures (including critical care time)  Medications Ordered in ED Medications - No data to display   Initial Impression / Assessment and Plan / ED Course  I have reviewed the triage vital signs and the nursing notes.  Pertinent labs & imaging results that were available during my care of the patient were reviewed by me and considered in my medical decision making (see chart for details).  Clinical Course   Vitals:   12/24/15 0131 12/24/15 0357  BP: 133/87 123/87  Pulse: 96 103  Resp: 14 19  Temp: 99 F (37.2 C)    Medications  lidocaine (LIDODERM) 5 % 1 patch (1 patch Transdermal Patch Applied 12/24/15 0359)  acetaminophen (TYLENOL) tablet 1,000 mg (1,000 mg Oral Given 12/24/15 0359)   Results for orders placed or performed during the hospital encounter of 12/24/15  Pregnancy, urine  Result Value Ref Range   Preg Test, Ur NEGATIVE NEGATIVE  Urinalysis, Routine w reflex microscopic (not at [facility])  Result Value Ref Range   Color, Urine YELLOW YELLOW   APPearance CLEAR CLEAR   Specific Gravity, Urine 1.028 1.005 - 1.030   pH 5.5 5.0 - 8.0   Glucose, UA NEGATIVE NEGATIVE mg/dL   Hgb urine dipstick NEGATIVE NEGATIVE   Bilirubin Urine SMALL (A) NEGATIVE   Ketones, ur NEGATIVE NEGATIVE mg/dL   Protein, ur NEGATIVE NEGATIVE mg/dL   Nitrite NEGATIVE NEGATIVE   Leukocytes, UA NEGATIVE NEGATIVE   Ct Head Wo Contrast  Result Date: 12/08/2015 CLINICAL DATA:  Dizziness, lightheadedness and instability when standing beginning this morning, history of diabetes mellitus, smoking EXAM: CT HEAD WITHOUT CONTRAST TECHNIQUE: Contiguous axial images were obtained from the base of the skull through the vertex without intravenous contrast. COMPARISON:  06/04/2015 FINDINGS: Normal ventricular morphology. No midline shift or mass effect. Normal appearance of brain parenchyma. No  intracranial hemorrhage, mass lesion, evidence of acute infarction or extra-axial fluid collection. Mucosal retention cyst RIGHT maxillary sinus. Scattered mucosal thickening in LEFT maxillary sinus and ethmoid air cells. No acute osseous abnormalities. IMPRESSION: No acute intracranial abnormalities. Electronically Signed   By: Lavonia Dana M.D.   On: 12/08/2015 11:04      Pt tolerated PO challenge well while in the ED.   Final Clinical Impressions(s) / ED Diagnoses   Final diagnoses:  None    New Prescriptions New Prescriptions   No medications on file  Patient has been seen 23 times for this same complaint.  Has had an extensive work up  this year.  Recent normal labs without change in symptoms.  Exam and vitals are benign and reassuring.  I do not feel labs or imaging are merited at this time.  Patient PO challenged successfully in the department.  She is instructed to follow up with her PMD for ongoing care.    All questions answered to patient's satisfaction. Based on history and exam patient has been appropriately medically screened and emergency conditions excluded. Patient is stable for discharge at this time. Follow up with your PMD for recheck in 2 days and strict return precautions given.  I personally performed the services described in this documentation, which was scribed in my presence. The recorded information has been reviewed and is accurate.       Veatrice Kells, MD 12/24/15 [phone]

## 2015-12-25 ENCOUNTER — Encounter (HOSPITAL_COMMUNITY): Payer: Self-pay | Admitting: Emergency Medicine

## 2015-12-25 DIAGNOSIS — I10 Essential (primary) hypertension: Secondary | ICD-10-CM | POA: Insufficient documentation

## 2015-12-25 DIAGNOSIS — R109 Unspecified abdominal pain: Secondary | ICD-10-CM | POA: Diagnosis present

## 2015-12-25 DIAGNOSIS — F17210 Nicotine dependence, cigarettes, uncomplicated: Secondary | ICD-10-CM | POA: Diagnosis not present

## 2015-12-25 DIAGNOSIS — Z7984 Long term (current) use of oral hypoglycemic drugs: Secondary | ICD-10-CM | POA: Insufficient documentation

## 2015-12-25 DIAGNOSIS — Z5321 Procedure and treatment not carried out due to patient leaving prior to being seen by health care provider: Secondary | ICD-10-CM | POA: Diagnosis not present

## 2015-12-25 DIAGNOSIS — G8929 Other chronic pain: Secondary | ICD-10-CM | POA: Diagnosis not present

## 2015-12-25 DIAGNOSIS — E119 Type 2 diabetes mellitus without complications: Secondary | ICD-10-CM | POA: Insufficient documentation

## 2015-12-25 LAB — URINALYSIS, ROUTINE W REFLEX MICROSCOPIC
Bilirubin Urine: NEGATIVE
GLUCOSE, UA: NEGATIVE mg/dL
Hgb urine dipstick: NEGATIVE
KETONES UR: NEGATIVE mg/dL
LEUKOCYTES UA: NEGATIVE
Nitrite: NEGATIVE
PH: 5.5 (ref 5.0–8.0)
Protein, ur: NEGATIVE mg/dL
SPECIFIC GRAVITY, URINE: 1.024 (ref 1.005–1.030)

## 2015-12-25 LAB — I-STAT CHEM 8, ED
BUN: 10 mg/dL (ref 6–20)
CREATININE: 0.8 mg/dL (ref 0.44–1.00)
Calcium, Ion: 1.24 mmol/L (ref 1.15–1.40)
Chloride: 103 mmol/L (ref 101–111)
Glucose, Bld: 91 mg/dL (ref 65–99)
HEMATOCRIT: 42 % (ref 36.0–46.0)
HEMOGLOBIN: 14.3 g/dL (ref 12.0–15.0)
POTASSIUM: 3.3 mmol/L — AB (ref 3.5–5.1)
Sodium: 144 mmol/L (ref 135–145)
TCO2: 26 mmol/L (ref 0–100)

## 2015-12-25 LAB — CBC WITH DIFFERENTIAL/PLATELET
BASOS ABS: 0 10*3/uL (ref 0.0–0.1)
Basophils Relative: 1 %
Eosinophils Absolute: 0.2 10*3/uL (ref 0.0–0.7)
Eosinophils Relative: 3 %
HEMATOCRIT: 40.6 % (ref 36.0–46.0)
Hemoglobin: 12.8 g/dL (ref 12.0–15.0)
LYMPHS PCT: 35 %
Lymphs Abs: 3.1 10*3/uL (ref 0.7–4.0)
MCH: 26 pg (ref 26.0–34.0)
MCHC: 31.5 g/dL (ref 30.0–36.0)
MCV: 82.5 fL (ref 78.0–100.0)
MONO ABS: 0.5 10*3/uL (ref 0.1–1.0)
Monocytes Relative: 6 %
NEUTROS ABS: 5 10*3/uL (ref 1.7–7.7)
Neutrophils Relative %: 57 %
Platelets: 220 10*3/uL (ref 150–400)
RBC: 4.92 MIL/uL (ref 3.87–5.11)
RDW: 14.7 % (ref 11.5–15.5)
WBC: 8.8 10*3/uL (ref 4.0–10.5)

## 2015-12-25 LAB — POC URINE PREG, ED: Preg Test, Ur: NEGATIVE

## 2015-12-25 LAB — CBG MONITORING, ED: Glucose-Capillary: 86 mg/dL (ref 65–99)

## 2015-12-25 NOTE — ED Triage Notes (Signed)
Patient with chronic abdominal pain.  Patient was seen at [facility] yesterday for the same.  Patient having nausea, vomiting and diarrhea.  Patient states that she having generalized fatigue.

## 2015-12-26 ENCOUNTER — Emergency Department (HOSPITAL_COMMUNITY)
Admission: EM | Admit: 2015-12-26 | Discharge: 2015-12-26 | Disposition: A | Discharge status: Home / Self Care | Payer: Medicaid Other | Attending: Emergency Medicine | Admitting: Emergency Medicine

## 2015-12-26 HISTORY — DX: Essential (primary) hypertension: I10

## 2015-12-26 NOTE — ED Notes (Signed)
Pt called to be roomed x3 with no answer.

## 2015-12-26 NOTE — ED Notes (Signed)
Pt called one more time with no response for room placement.

## 2015-12-27 ENCOUNTER — Ambulatory Visit (INDEPENDENT_AMBULATORY_CARE_PROVIDER_SITE_OTHER): Payer: Medicaid Other | Admitting: Family Medicine

## 2015-12-27 ENCOUNTER — Encounter: Payer: Self-pay | Admitting: Family Medicine

## 2015-12-27 VITALS — BP 131/78 | HR 78 | Ht 67.0 in | Wt 266.2 lb

## 2015-12-27 DIAGNOSIS — R109 Unspecified abdominal pain: Secondary | ICD-10-CM

## 2015-12-27 NOTE — Progress Notes (Signed)
   Subjective:    Patient ID: [name], female    DOB: 25-Aug-1990, 25 y.o.   MRN: [id number]  HPI Patient seen for 1 year history of constant, sharp abdominal pain that doesn't radiate. Thinks pain is getting worse. Has been taking naproxen 500mg  BID, which is helpful. Has history of prior c/section x3. Has been to ED multiple times for this. Had CT 07/08/15, which showed two masses in c-section scar: 4.3cm on right and 2.9cm on left.  Has nexplanon x2 years - does not have periods, although occasionally has spotting.  Pain not worse when has spotting.  I have reviewed the patients past medical, family, and social history.  I have reviewed the patient's medication list and allergies.  Review of Systems  Constitutional: Negative for chills and fever.  Respiratory: Negative for shortness of breath.   Gastrointestinal: Positive for diarrhea (intermittently) and nausea (intermittently). Negative for constipation.  Genitourinary: Negative for decreased urine volume, dyspareunia, pelvic pain, urgency, vaginal bleeding, vaginal discharge and vaginal pain.  All other systems reviewed and are negative.      Objective:   Physical Exam  Constitutional: She is oriented to person, place, and time. She appears well-developed and well-nourished.  HENT:  Head: Normocephalic and atraumatic.  Right Ear: External ear normal.  Left Ear: External ear normal.  Eyes: Conjunctivae are normal. Pupils are equal, round, and reactive to light.  Neck: Normal range of motion.  Cardiovascular: Normal rate, regular rhythm and normal heart sounds.  Exam reveals no gallop and no friction rub.   No murmur heard. Pulmonary/Chest: Effort normal and breath sounds normal. No respiratory distress. She has no wheezes. She has no rales. She exhibits no tenderness.  Abdominal: Soft. Bowel sounds are normal. She exhibits mass (tender mass about 5-6 cm below umbilicus - palpates about 5-6cm in diameter). There is tenderness  (abdominal wall tenderness [id number] below umbilicus and in right corner of c-section scar). There is no rebound and no guarding.  Musculoskeletal: She exhibits no edema, tenderness or deformity.  Neurological: She is alert and oriented to person, place, and time.  Skin: Skin is warm and dry.  Psychiatric: She has a normal mood and affect. Her behavior is normal. Judgment and thought content normal.      Assessment & Plan:  1. Abdominal wall pain Possible endometrioma.  Will refer to general surgery. - Ambulatory referral to General Surgery

## 2015-12-27 NOTE — Progress Notes (Signed)
[facility] to schedule referral for general surgery for possible endometrioma.  They will call patient with appointment  And fax [place] that information after they receive fax with patient demographics, insurance, office notes.

## 2015-12-31 ENCOUNTER — Emergency Department (HOSPITAL_COMMUNITY)
Admission: EM | Admit: 2015-12-31 | Discharge: 2015-12-31 | Disposition: A | Discharge status: Home / Self Care | Payer: Medicaid Other | Attending: Emergency Medicine | Admitting: Emergency Medicine

## 2015-12-31 ENCOUNTER — Encounter (HOSPITAL_COMMUNITY): Payer: Self-pay

## 2015-12-31 DIAGNOSIS — R109 Unspecified abdominal pain: Secondary | ICD-10-CM

## 2015-12-31 DIAGNOSIS — E119 Type 2 diabetes mellitus without complications: Secondary | ICD-10-CM | POA: Insufficient documentation

## 2015-12-31 DIAGNOSIS — G8929 Other chronic pain: Secondary | ICD-10-CM | POA: Diagnosis not present

## 2015-12-31 DIAGNOSIS — I10 Essential (primary) hypertension: Secondary | ICD-10-CM | POA: Diagnosis not present

## 2015-12-31 DIAGNOSIS — Z7984 Long term (current) use of oral hypoglycemic drugs: Secondary | ICD-10-CM | POA: Diagnosis not present

## 2015-12-31 DIAGNOSIS — F17210 Nicotine dependence, cigarettes, uncomplicated: Secondary | ICD-10-CM | POA: Insufficient documentation

## 2015-12-31 DIAGNOSIS — N898 Other specified noninflammatory disorders of vagina: Secondary | ICD-10-CM

## 2015-12-31 DIAGNOSIS — R1030 Lower abdominal pain, unspecified: Secondary | ICD-10-CM | POA: Diagnosis present

## 2015-12-31 LAB — COMPREHENSIVE METABOLIC PANEL
ALBUMIN: 3.9 g/dL (ref 3.5–5.0)
ALK PHOS: 49 U/L (ref 38–126)
ALT: 27 U/L (ref 14–54)
ANION GAP: 7 (ref 5–15)
AST: 26 U/L (ref 15–41)
BUN: 10 mg/dL (ref 6–20)
CHLORIDE: 107 mmol/L (ref 101–111)
CO2: 25 mmol/L (ref 22–32)
Calcium: 9 mg/dL (ref 8.9–10.3)
Creatinine, Ser: 0.82 mg/dL (ref 0.44–1.00)
GFR calc non Af Amer: >60 mL/min (ref >60)
GLUCOSE: 188 mg/dL — AB (ref 65–99)
POTASSIUM: 4 mmol/L (ref 3.5–5.1)
SODIUM: 139 mmol/L (ref 135–145)
Total Bilirubin: 0.4 mg/dL (ref 0.3–1.2)
Total Protein: 6.4 g/dL — ABNORMAL LOW (ref 6.5–8.1)

## 2015-12-31 LAB — CBC
HEMATOCRIT: 39.5 % (ref 36.0–46.0)
HEMOGLOBIN: 12.4 g/dL (ref 12.0–15.0)
MCH: 25.9 pg — AB (ref 26.0–34.0)
MCHC: 31.4 g/dL (ref 30.0–36.0)
MCV: 82.6 fL (ref 78.0–100.0)
Platelets: 179 10*3/uL (ref 150–400)
RBC: 4.78 MIL/uL (ref 3.87–5.11)
RDW: 14.6 % (ref 11.5–15.5)
WBC: 8.2 10*3/uL (ref 4.0–10.5)

## 2015-12-31 LAB — URINALYSIS, ROUTINE W REFLEX MICROSCOPIC
BILIRUBIN URINE: NEGATIVE
Glucose, UA: 500 mg/dL — AB
HGB URINE DIPSTICK: NEGATIVE
Ketones, ur: NEGATIVE mg/dL
Leukocytes, UA: NEGATIVE
Nitrite: NEGATIVE
PH: 6 (ref 5.0–8.0)
Protein, ur: NEGATIVE mg/dL
SPECIFIC GRAVITY, URINE: 1.027 (ref 1.005–1.030)

## 2015-12-31 LAB — WET PREP, GENITAL
CLUE CELLS WET PREP: NONE SEEN
SPERM: NONE SEEN
Trich, Wet Prep: NONE SEEN
Yeast Wet Prep HPF POC: NONE SEEN

## 2015-12-31 LAB — LIPASE, BLOOD: Lipase: 27 U/L (ref 11–51)

## 2015-12-31 LAB — POC URINE PREG, ED: Preg Test, Ur: NEGATIVE

## 2015-12-31 MED ORDER — FLUCONAZOLE 100 MG PO TABS
150.0000 mg | ORAL_TABLET | Freq: Once | ORAL | Status: AC
  Administered 2015-12-31: 150 mg via ORAL
  Filled 2015-12-31: qty 2

## 2015-12-31 NOTE — ED Provider Notes (Signed)
[facility] DEPT Provider Note   CSN: [id number] Arrival date & time: 12/31/15  1051     History   Chief Complaint Chief Complaint  Patient presents with  . Abdominal Pain    HPI Karin A Topalian is a 25 y.o. female.  Zuriah A Wiltfong is a 25 y.o. Female who presents to the emergency department complaining of brown vaginal discharge starting today as well as lower abdominal pain ongoing for the past year. Patient reports she was seen at the [facility] and told she has a mass in her abdomen and is being referred to general surgery. On chart review she was seen by OB/GYN on 12/27/2015 and diagnosed with a possible endometrioma and referred to general surgery. She reports ongoing pain there for over a year. No nausea, vomiting or diarrhea today. She reports brown vaginal discharge starting today. She reports she has not been sexually active in several months. She does have a history of STDs. Patient denies fevers, nausea, vomiting, diarrhea, rashes, urinary symptoms, vaginal bleeding, chest pain, shortness of breath or coughing.   The history is provided by the patient and medical records. No language interpreter was used.  Abdominal Pain   Pertinent negatives include fever, diarrhea, nausea, vomiting, dysuria, frequency, hematuria and headaches.    Past Medical History:  Diagnosis Date  . Anxiety   . Bipolar 1 disorder ([place])   . Cognitive deficits   . Depression   . Diabetes mellitus without complication ([place])   . Hypertension   . Mental disorder   . Obesity     Patient Active Problem List   Diagnosis Date Noted  . 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 Learning disability 07/18/2015  . 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 Diabetes mellitus ([place]) 07/18/2015  . 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 Hyperlipidemia 07/18/2015  . Drug overdose   . Cognitive deficits 10/12/2012  . Generalized anxiety disorder  06/28/2012    Past Surgical History:  Procedure Laterality Date  . CESAREAN SECTION    . CESAREAN SECTION N/A 04/25/2013   Procedure: REPEAT CESAREAN SECTION;  Surgeon: [name], MD;  Location: [facility];  Service: Obstetrics;  Laterality: N/A;  . MASS EXCISION N/A 06/03/2012   Procedure: EXCISION MASS;  Surgeon: [name], MD;  Location: [facility];  Service: ENT;  Laterality: N/A;  Excision uvula mass  . TONSILLECTOMY N/A 06/03/2012   Procedure: TONSILLECTOMY;  Surgeon: [name], MD;  Location: [facility];  Service: ENT;  Laterality: N/A;  . TONSILLECTOMY      OB History    Gravida Para Term Preterm AB Living   3 3 3  0 0 3   SAB TAB Ectopic Multiple Live Births   0 0 0 0 3       Home Medications    Prior to Admission medications   Medication Sig Start Date End Date Taking? Authorizing Provider  busPIRone (BUSPAR) 15 MG tablet Take 15 mg by mouth daily.    Historical Provider, MD  DULoxetine (CYMBALTA) 60 MG capsule Take 60 mg by mouth at bedtime.     Historical Provider, MD  fluvoxaMINE (LUVOX) 100 MG tablet Take 100 mg by mouth at bedtime.    Historical Provider, MD  glipiZIDE (GLUCOTROL XL) 10 MG 24 hr tablet Take 10 mg by mouth daily with breakfast.    Historical Provider, MD  lidocaine (LIDODERM) 5 % Place 1 patch onto the skin daily. Remove & Discard patch  within 12 hours or as directed by MD Patient not taking: Reported on 12/27/2015 12/24/15   April Palumbo, MD  metFORMIN (GLUCOPHAGE) 500 MG tablet Take 500 mg by mouth 2 (two) times daily with a meal.    Historical Provider, MD  naproxen (NAPROSYN) 500 MG tablet Take 1 tablet (500 mg total) by mouth 2 (two) times daily. Patient not taking: Reported on 12/24/2015 12/21/15   [name], PA-C  paliperidone (INVEGA SUSTENNA) 234 MG/1.5ML SUSP injection Inject 234 mg into the muscle every 30 (thirty) days.    Historical Provider, MD    Family History Family History  Problem Relation  Age of Onset  . Hypertension Mother   . Diabetes Father     Social History Social History  Substance Use Topics  . Smoking status: Current Every Day Smoker    Packs/day: 1.00    Years: 7.00    Types: Cigarettes    Last attempt to quit: 06/30/2015  . Smokeless tobacco: Never Used  . Alcohol use No     Allergies   Omnipaque [iohexol] and Penicillins   Review of Systems Review of Systems  Constitutional: Negative for chills and fever.  HENT: Negative for congestion and sore throat.   Eyes: Negative for visual disturbance.  Respiratory: Negative for cough, shortness of breath and wheezing.   Cardiovascular: Negative for chest pain and palpitations.  Gastrointestinal: Positive for abdominal pain. Negative for blood in stool, diarrhea, nausea and vomiting.  Genitourinary: Positive for vaginal discharge. Negative for decreased urine volume, difficulty urinating, dysuria, flank pain, frequency, hematuria, menstrual problem, urgency and vaginal bleeding.  Musculoskeletal: Negative for back pain and neck pain.  Skin: Negative for rash.  Neurological: Negative for headaches.     Physical Exam Updated Vital Signs BP 133/98 (BP Location: Left Arm)   Pulse 92   Temp 98.5 F (36.9 C) (Oral)   Resp 16   Ht 5\' 7"  (1.702 m)   Wt 120.2 kg   LMP  (LMP Unknown)   SpO2 98%   BMI 41.50 kg/m   Physical Exam  Constitutional: She appears well-developed and well-nourished. No distress.  HENT:  Head: Normocephalic and atraumatic.  Mouth/Throat: Oropharynx is clear and moist.  Eyes: Conjunctivae are normal. Pupils are equal, round, and reactive to light. Right eye exhibits no discharge. Left eye exhibits no discharge.  Neck: Neck supple.  Cardiovascular: Normal rate, regular rhythm, normal heart sounds and intact distal pulses.  Exam reveals no gallop and no friction rub.   No murmur heard. Pulmonary/Chest: Effort normal and breath sounds normal. No respiratory distress. She has no  wheezes. She has no rales.  Abdominal: Soft. Bowel sounds are normal. She exhibits no distension. There is no tenderness. There is no guarding.  Abdomen is soft and nontender to palpation. No peritoneal signs.  Genitourinary: Vaginal discharge found.  Genitourinary Comments: Pelvic exam performed by me with female RN chaperone. Slight amount of brown discharge noted. No cervical motion tenderness. Cervix is closed. No vaginal bleeding. No adnexal tenderness or fullness.  Musculoskeletal: She exhibits no edema.  Lymphadenopathy:    She has no cervical adenopathy.  Neurological: She is alert. Coordination normal.  Skin: Skin is warm and dry. Capillary refill takes less than 2 seconds. No rash noted. She is not diaphoretic. No erythema. No pallor.  Psychiatric: She has a normal mood and affect. Her behavior is normal.  Nursing note and vitals reviewed.    ED Treatments / Results  Labs (all labs ordered are listed,  but only abnormal results are displayed) Labs Reviewed  WET PREP, GENITAL - Abnormal; Notable for the following:       Result Value   WBC, Wet Prep HPF POC FEW (*)    All other components within normal limits  COMPREHENSIVE METABOLIC PANEL - Abnormal; Notable for the following:    Glucose, Bld 188 (*)    Total Protein 6.4 (*)    All other components within normal limits  CBC - Abnormal; Notable for the following:    MCH 25.9 (*)    All other components within normal limits  URINALYSIS, ROUTINE W REFLEX MICROSCOPIC (NOT AT [facility]) - Abnormal; Notable for the following:    Glucose, UA 500 (*)    All other components within normal limits  LIPASE, BLOOD  POC URINE PREG, ED  GC/CHLAMYDIA PROBE AMP ([place]) NOT AT [facility]    EKG  EKG Interpretation None       Radiology No results found.  Procedures Procedures (including critical care time)  Medications Ordered in ED Medications  fluconazole (DIFLUCAN) tablet 150 mg (not administered)     Initial Impression /  Assessment and Plan / ED Course  I have reviewed the triage vital signs and the nursing notes.  Pertinent labs & imaging results that were available during my care of the patient were reviewed by me and considered in my medical decision making (see chart for details).  Clinical Course   Patient presented to the emergency department complaining of vaginal discharge starting today as well as her chronic abdominal pain that has been ongoing for more than a year. She denies nausea, vomiting or diarrhea. She is tolerating by mouth. She was seen recently by her OB/GYN and referred to general surgery for possible endometrioma. On exam the patient is afebrile nontoxic appearing. Abdomen is soft and nontender to palpation. On pelvic exam she does have a slight amount of brown vaginal discharge. No cervical motion tenderness. No adnexal tenderness or fullness. Pregnancy test is negative. Urinalysis shows no sign of infection. CBC and CMP are unremarkable. Wet prep returned with few white blood cells and is otherwise unremarkable. I discussed these findings with the patient. She tells me she has not been sexually active recently and does not believe she has an STD. She wants to hold off on treatment for any STD until gonorrhea Chlamydia test results return. 5 I'm okay with this plan. She also tells me that she may have had a yeast infection previously and this might have felt similar. She is not exactly sure. I offer the patient Diflucan and she would like. There is no evidence of yeast on wet prep but the patient does report this may feel similar. Patient will have 1 dose of Diflucan at discharge and gonorrhea and Chlamydia test results are pending. I advised that her symptoms persist and test results are negative she should follow closely with her OB/GYN for recheck. She has eaten a [place] sandwich prior to discharge.  I advised the patient to follow-up with their primary care provider this week. I advised the  patient to return to the emergency department with new or worsening symptoms or new concerns. The patient verbalized understanding and agreement with plan.      Final Clinical Impressions(s) / ED Diagnoses   Final diagnoses:  Vaginal discharge  Chronic abdominal pain    New Prescriptions New Prescriptions   No medications on file     Waynetta Pean, PA-C 12/31/15 [place],  MD 12/31/15 1535

## 2015-12-31 NOTE — ED Triage Notes (Signed)
Pt reports she has a mass in her abd, she is scheduled to see a surgeon but pain has continued. Pt also reports brown vaginal discharge that started this morning. Pt reports some nausea but denies vomiting.

## 2016-01-01 LAB — GC/CHLAMYDIA PROBE AMP (~~LOC~~) NOT AT ARMC
CHLAMYDIA, DNA PROBE: NEGATIVE
Neisseria Gonorrhea: NEGATIVE

## 2016-01-03 ENCOUNTER — Encounter (HOSPITAL_COMMUNITY): Payer: Self-pay | Admitting: Physical Medicine and Rehabilitation

## 2016-01-03 ENCOUNTER — Emergency Department (HOSPITAL_COMMUNITY): Payer: Medicaid Other

## 2016-01-03 ENCOUNTER — Emergency Department (HOSPITAL_COMMUNITY)
Admission: EM | Admit: 2016-01-03 | Discharge: 2016-01-03 | Disposition: A | Discharge status: Home / Self Care | Payer: Medicaid Other | Attending: Emergency Medicine | Admitting: Emergency Medicine

## 2016-01-03 DIAGNOSIS — E119 Type 2 diabetes mellitus without complications: Secondary | ICD-10-CM | POA: Diagnosis not present

## 2016-01-03 DIAGNOSIS — I10 Essential (primary) hypertension: Secondary | ICD-10-CM | POA: Insufficient documentation

## 2016-01-03 DIAGNOSIS — Z5321 Procedure and treatment not carried out due to patient leaving prior to being seen by health care provider: Secondary | ICD-10-CM | POA: Diagnosis not present

## 2016-01-03 DIAGNOSIS — Z79899 Other long term (current) drug therapy: Secondary | ICD-10-CM | POA: Insufficient documentation

## 2016-01-03 DIAGNOSIS — Z7984 Long term (current) use of oral hypoglycemic drugs: Secondary | ICD-10-CM | POA: Insufficient documentation

## 2016-01-03 DIAGNOSIS — F17210 Nicotine dependence, cigarettes, uncomplicated: Secondary | ICD-10-CM | POA: Diagnosis not present

## 2016-01-03 DIAGNOSIS — R079 Chest pain, unspecified: Secondary | ICD-10-CM | POA: Insufficient documentation

## 2016-01-03 LAB — COMPREHENSIVE METABOLIC PANEL
ALK PHOS: 47 U/L (ref 38–126)
ALT: 27 U/L (ref 14–54)
AST: 26 U/L (ref 15–41)
Albumin: 4 g/dL (ref 3.5–5.0)
Anion gap: 6 (ref 5–15)
BUN: 13 mg/dL (ref 6–20)
CALCIUM: 9.1 mg/dL (ref 8.9–10.3)
CHLORIDE: 108 mmol/L (ref 101–111)
CO2: 26 mmol/L (ref 22–32)
CREATININE: 0.73 mg/dL (ref 0.44–1.00)
GFR calc Af Amer: >60 mL/min (ref >60)
GFR calc non Af Amer: >60 mL/min (ref >60)
GLUCOSE: 102 mg/dL — AB (ref 65–99)
Potassium: 3.9 mmol/L (ref 3.5–5.1)
SODIUM: 140 mmol/L (ref 135–145)
Total Bilirubin: 0.7 mg/dL (ref 0.3–1.2)
Total Protein: 6.8 g/dL (ref 6.5–8.1)

## 2016-01-03 LAB — CBC
HCT: 38.4 % (ref 36.0–46.0)
HEMOGLOBIN: 12.2 g/dL (ref 12.0–15.0)
MCH: 25.8 pg — AB (ref 26.0–34.0)
MCHC: 31.8 g/dL (ref 30.0–36.0)
MCV: 81.4 fL (ref 78.0–100.0)
PLATELETS: 211 10*3/uL (ref 150–400)
RBC: 4.72 MIL/uL (ref 3.87–5.11)
RDW: 14.7 % (ref 11.5–15.5)
WBC: 8.1 10*3/uL (ref 4.0–10.5)

## 2016-01-03 LAB — I-STAT TROPONIN, ED: Troponin i, poc: 0 ng/mL (ref 0.00–0.08)

## 2016-01-03 NOTE — ED Triage Notes (Signed)
Pt reports diffuse chest pain, onset Thursday evening. Describes pain as sharp sensation. 5/10 chest pain upon arrival to ED. Respirations unlabored. Pt is alert and oriented x4.

## 2016-01-03 NOTE — ED Notes (Signed)
Pt called for reassessment. No answer

## 2016-01-04 ENCOUNTER — Encounter (HOSPITAL_COMMUNITY): Payer: Self-pay | Admitting: Emergency Medicine

## 2016-01-04 ENCOUNTER — Emergency Department (HOSPITAL_COMMUNITY)
Admission: EM | Admit: 2016-01-04 | Discharge: 2016-01-04 | Disposition: A | Discharge status: Home / Self Care | Payer: Medicaid Other | Attending: Emergency Medicine | Admitting: Emergency Medicine

## 2016-01-04 ENCOUNTER — Emergency Department (HOSPITAL_COMMUNITY): Payer: Medicaid Other

## 2016-01-04 DIAGNOSIS — F3340 Major depressive disorder, recurrent, in remission, unspecified: Secondary | ICD-10-CM | POA: Diagnosis not present

## 2016-01-04 DIAGNOSIS — G8929 Other chronic pain: Secondary | ICD-10-CM | POA: Diagnosis not present

## 2016-01-04 DIAGNOSIS — E119 Type 2 diabetes mellitus without complications: Secondary | ICD-10-CM | POA: Diagnosis not present

## 2016-01-04 DIAGNOSIS — R079 Chest pain, unspecified: Secondary | ICD-10-CM

## 2016-01-04 DIAGNOSIS — I10 Essential (primary) hypertension: Secondary | ICD-10-CM | POA: Diagnosis not present

## 2016-01-04 DIAGNOSIS — R0789 Other chest pain: Secondary | ICD-10-CM | POA: Insufficient documentation

## 2016-01-04 DIAGNOSIS — F17210 Nicotine dependence, cigarettes, uncomplicated: Secondary | ICD-10-CM | POA: Insufficient documentation

## 2016-01-04 DIAGNOSIS — Z79899 Other long term (current) drug therapy: Secondary | ICD-10-CM | POA: Insufficient documentation

## 2016-01-04 DIAGNOSIS — Z7984 Long term (current) use of oral hypoglycemic drugs: Secondary | ICD-10-CM | POA: Insufficient documentation

## 2016-01-04 DIAGNOSIS — R1084 Generalized abdominal pain: Secondary | ICD-10-CM | POA: Diagnosis present

## 2016-01-04 LAB — CBC WITH DIFFERENTIAL/PLATELET
Basophils Absolute: 0.1 10*3/uL (ref 0.0–0.1)
Basophils Relative: 1 %
Eosinophils Absolute: 0.3 10*3/uL (ref 0.0–0.7)
Eosinophils Relative: 3 %
HEMATOCRIT: 38.8 % (ref 36.0–46.0)
HEMOGLOBIN: 12.6 g/dL (ref 12.0–15.0)
LYMPHS ABS: 4 10*3/uL (ref 0.7–4.0)
Lymphocytes Relative: 40 %
MCH: 26.1 pg (ref 26.0–34.0)
MCHC: 32.5 g/dL (ref 30.0–36.0)
MCV: 80.5 fL (ref 78.0–100.0)
MONOS PCT: 6 %
Monocytes Absolute: 0.6 10*3/uL (ref 0.1–1.0)
NEUTROS ABS: 5.1 10*3/uL (ref 1.7–7.7)
NEUTROS PCT: 50 %
Platelets: 252 10*3/uL (ref 150–400)
RBC: 4.82 MIL/uL (ref 3.87–5.11)
RDW: 14.5 % (ref 11.5–15.5)
WBC: 10 10*3/uL (ref 4.0–10.5)

## 2016-01-04 LAB — BASIC METABOLIC PANEL
ANION GAP: 7 (ref 5–15)
BUN: 13 mg/dL (ref 6–20)
CHLORIDE: 106 mmol/L (ref 101–111)
CO2: 27 mmol/L (ref 22–32)
Calcium: 9.6 mg/dL (ref 8.9–10.3)
Creatinine, Ser: 0.79 mg/dL (ref 0.44–1.00)
GFR calc non Af Amer: >60 mL/min (ref >60)
GLUCOSE: 77 mg/dL (ref 65–99)
Potassium: 3.7 mmol/L (ref 3.5–5.1)
Sodium: 140 mmol/L (ref 135–145)

## 2016-01-04 LAB — I-STAT TROPONIN, ED: Troponin i, poc: 0 ng/mL (ref 0.00–0.08)

## 2016-01-04 MED ORDER — PALIPERIDONE PALMITATE 234 MG/1.5ML IM SUSP
234.0000 mg | INTRAMUSCULAR | Status: DC
  Administered 2016-01-04: 234 mg via INTRAMUSCULAR
  Filled 2016-01-04: qty 1.5

## 2016-01-04 MED ORDER — RANITIDINE HCL 150 MG PO CAPS: 150.0000 mg | ORAL_CAPSULE | Freq: Every day | ORAL | 0 refills | Status: DC

## 2016-01-04 NOTE — ED Triage Notes (Signed)
Patient reports mid sternal chest pain for three days.  She also reports she is itching all over which occurs if she doesn't get her shot for depression.  Denies nausea, vomiting, dizziness, or SOB.  Patient is alert and oriented.  She has a care plan on file.

## 2016-01-04 NOTE — ED Notes (Signed)
Bed: [id number] Expected date:  Expected time:  Means of arrival:  Comments: 25 yo chest wall pain x3 days

## 2016-01-04 NOTE — ED Notes (Signed)
Patient reports her chest pain is a 5.  She reports having tumors in her stomach.  She also reports urinating more frequently.  No pain with urination.  She also says she is itching all over although there is no rash and patient not actively scratching herself.  She reports she is due for her injection for depression, but the doctor was not there when she went to get it.

## 2016-01-04 NOTE — ED Provider Notes (Signed)
[facility] DEPT Provider Note   CSN: [id number] Arrival date & time: 01/04/16  1151     History   Chief Complaint Chief Complaint  Patient presents with  . Chest Pain    HPI [name] is a 25 y.o. female.  [name] is a 25 y.o. Female with a history of cognitive deficits, depression, diabetes and chronic abdominal pain who presents to the emergency department complaining of left chest pain for the past 3 days. She reports she only has pain with palpation and with laughing. She reports is about ongoing for about 3 days. She denies coughing, shortness of breath or palpitations. She also reports she's been feeling itchy all over because she missed her [place] injection. [facility] and they were not able to give her her monthly injection. She reports they told her to follow-up in one week. She was due to for her injection on Thursday. She reports when she misses that she feels itching all over her body. She reports her chronic ongoing abdominal pain that is unchanged. Patient denies fevers, coughing, hemoptysis, shortness of breath, palpitations, leg pain, leg swelling, history of MI, nausea, vomiting, diarrhea or rashes.   The history is provided by the patient and medical records. No language interpreter was used.  Chest Pain   Associated symptoms include abdominal pain (Chronic and unchanged). Pertinent negatives include no back pain, no cough, no dizziness, no fever, no headaches, no nausea, no numbness, no palpitations, no shortness of breath, no vomiting and no weakness.    Past Medical History:  Diagnosis Date  . Anxiety   . Bipolar 1 disorder ([place])   . Cognitive deficits   . Depression   . Diabetes mellitus without complication ([place])   . Hypertension   . Mental disorder   . Obesity     Patient Active Problem List   Diagnosis Date Noted  . 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  . 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 Diabetes mellitus ([place]) 07/18/2015  . 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 Hyperlipidemia 07/18/2015  . Drug overdose   . Cognitive deficits 10/12/2012  . Generalized anxiety disorder 06/28/2012    Past Surgical History:  Procedure Laterality Date  . CESAREAN SECTION    . CESAREAN SECTION N/A 04/25/2013   Procedure: REPEAT CESAREAN SECTION;  Surgeon: [name], MD;  Location: [facility];  Service: Obstetrics;  Laterality: N/A;  . MASS EXCISION N/A 06/03/2012   Procedure: EXCISION MASS;  Surgeon: [name], MD;  Location: [facility];  Service: ENT;  Laterality: N/A;  Excision uvula mass  . TONSILLECTOMY N/A 06/03/2012   Procedure: TONSILLECTOMY;  Surgeon: [name], MD;  Location: [facility];  Service: ENT;  Laterality: N/A;  . TONSILLECTOMY      OB History    Gravida Para Term Preterm AB Living   3 3 3  0 0 3   SAB TAB Ectopic Multiple Live Births   0 0 0 0 3       Home Medications    Prior to Admission medications   Medication Sig Start Date End Date Taking? Authorizing Provider  busPIRone (BUSPAR) 15 MG tablet Take 15 mg by mouth daily.   Yes Historical Provider, MD  DULoxetine (CYMBALTA) 60 MG capsule Take 60 mg by mouth at bedtime.    Yes Historical Provider, MD  fluvoxaMINE (LUVOX) 100 MG tablet Take  100 mg by mouth daily.    Yes Historical Provider, MD  glipiZIDE (GLUCOTROL XL) 10 MG 24 hr tablet Take 10 mg by mouth daily with breakfast.   Yes Historical Provider, MD  metFORMIN (GLUCOPHAGE) 500 MG tablet Take 500 mg by mouth 2 (two) times daily with a meal.   Yes Historical Provider, MD  paliperidone (INVEGA SUSTENNA) 234 MG/1.5ML SUSP injection Inject 234 mg into the muscle every 30 (thirty) days.   Yes Historical Provider, MD  lidocaine (LIDODERM) 5 % Place 1 patch onto the skin daily. Remove & Discard patch within 12  hours or as directed by MD Patient not taking: Reported on 12/27/2015 12/24/15   April Palumbo, MD  naproxen (NAPROSYN) 500 MG tablet Take 1 tablet (500 mg total) by mouth 2 (two) times daily. Patient not taking: Reported on 12/24/2015 12/21/15   [name], PA-C  ranitidine (ZANTAC) 150 MG capsule Take 1 capsule (150 mg total) by mouth daily. 01/04/16   Waynetta Pean, PA-C    Family History Family History  Problem Relation Age of Onset  . Hypertension Mother   . Diabetes Father     Social History Social History  Substance Use Topics  . Smoking status: Current Every Day Smoker    Packs/day: 1.00    Years: 7.00    Types: Cigarettes    Last attempt to quit: 06/30/2015  . Smokeless tobacco: Never Used  . Alcohol use No     Allergies   Omnipaque [iohexol] and Penicillins   Review of Systems Review of Systems  Constitutional: Negative for chills and fever.  HENT: Negative for congestion and sore throat.   Eyes: Negative for visual disturbance.  Respiratory: Negative for cough, shortness of breath and wheezing.   Cardiovascular: Positive for chest pain. Negative for palpitations and leg swelling.  Gastrointestinal: Positive for abdominal pain (Chronic and unchanged). Negative for diarrhea, nausea and vomiting.  Genitourinary: Negative for dysuria.  Musculoskeletal: Negative for back pain and neck pain.  Skin: Negative for rash.  Neurological: Negative for dizziness, syncope, weakness, light-headedness, numbness and headaches.     Physical Exam Updated Vital Signs BP 145/93 (BP Location: Right Arm)   Pulse 99   Temp 98.2 F (36.8 C) (Oral)   Resp 20   LMP  (LMP Unknown)   SpO2 100%   Physical Exam  Constitutional: She appears well-developed and well-nourished. No distress.  Nontoxic-appearing. Obese female.  HENT:  Head: Normocephalic and atraumatic.  Mouth/Throat: Oropharynx is clear and moist.  Eyes: Conjunctivae are normal. Pupils are equal, round, and reactive to  light. Right eye exhibits no discharge. Left eye exhibits no discharge.  Neck: Neck supple.  Cardiovascular: Normal rate, regular rhythm, normal heart sounds and intact distal pulses.  Exam reveals no gallop and no friction rub.   No murmur heard. Bilateral radial and dorsalis pedis pulses are intact.  Pulmonary/Chest: Effort normal and breath sounds normal. No respiratory distress. She has no wheezes. She has no rales. She exhibits tenderness.  Lungs are clear to auscultation bilaterally. Left chest wall is tender to palpation and reproduces her chest pain.  Abdominal: Soft. There is tenderness (Chronic abdominal pain.).  Abdomen is soft. Bowel sounds are present. Patient has mild generalized abdominal tenderness to palpation without focal tenderness. No peritoneal signs.  Musculoskeletal: She exhibits no edema.  Lymphadenopathy:    She has no cervical adenopathy.  Neurological: She is alert. Coordination normal.  Skin: Skin is warm and dry. Capillary refill takes less than 2 seconds.  No rash noted. She is not diaphoretic. No erythema. No pallor.  Psychiatric: She has a normal mood and affect. Her behavior is normal.  Nursing note and vitals reviewed.    ED Treatments / Results  Labs (all labs ordered are listed, but only abnormal results are displayed) Labs Reviewed  BASIC METABOLIC PANEL  CBC WITH DIFFERENTIAL/PLATELET  Randolm Idol, ED    EKG  EKG Interpretation  Date/Time:  Saturday January 04 2016 13:18:14 EDT Ventricular Rate:  92 PR Interval:    QRS Duration: 90 QT Interval:  364 QTC Calculation: 451 R Axis:   84 Text Interpretation:  Sinus rhythm No significant change since last tracing Confirmed by Maryan Rued  MD, Loree Fee ([id number]) on 01/04/2016 1:22:23 PM       Radiology Dg Chest 2 View  Result Date: 01/04/2016 CLINICAL DATA:  Mid sternal chest pain for 3 days EXAM: CHEST  2 VIEW COMPARISON:  01/03/2016 FINDINGS: The heart size and mediastinal contours are  within normal limits. Both lungs are clear. The visualized skeletal structures are unremarkable. IMPRESSION: No active cardiopulmonary disease. Electronically Signed   By: Lahoma Crocker M.D.   On: 01/04/2016 13:11   Dg Chest 2 View  Result Date: 01/03/2016 CLINICAL DATA:  Left chest pain and upper abdominal pain since yesterday. EXAM: CHEST  2 VIEW COMPARISON:  10/20/2015 FINDINGS: The heart size and mediastinal contours are within normal limits. Both lungs are clear. The visualized skeletal structures are unremarkable. IMPRESSION: No active cardiopulmonary disease. Electronically Signed   By: Misty Stanley M.D.   On: 01/03/2016 12:32    Procedures Procedures (including critical care time)  Medications Ordered in ED Medications  paliperidone (INVEGA SUSTENNA) injection 234 mg (234 mg Intramuscular Given 01/04/16 1455)     Initial Impression / Assessment and Plan / ED Course  I have reviewed the triage vital signs and the nursing notes.  Pertinent labs & imaging results that were available during my care of the patient were reviewed by me and considered in my medical decision making (see chart for details).  Clinical Course  Comment By Time  I spoke with pharmacy who has Lorayne Bender sustenna and can give it to her. I advised nursing to be on the lookout. I rechecked the patient and she reports she is feeling well. She reports her chest pain has resolved.  Waynetta Pean, PA-C 09/09 1353   Patient presented to the emergency department complaining of substernal nonradiating chest pain for the past 3 days with palpation of her chest or with laughing. She denies any shortness of breath or palpitations. She also reports she did not receive her [place] injection this past Thursday because her physician was not in the office. She reports this makes her feel itchy when she has missed her dose. On exam patient is afebrile nontoxic-appearing. Lungs clear to auscultation bilaterally. No lower extremity  edema or tenderness. She has some substernal chest wall tenderness to palpation which reproduces her pain. She also reports some burping and belching. Troponin is not elevated. BMP and CBC are within normal limits. Chest x-ray is unremarkable. EKG shows normal sinus rhythm and no changes from her previous tracing. I have a low suspicion for ACS or PE. At recheck patient reports she is pain-free. I spoke with pharmacy who reports we're able to provide her with her injection of [place]. Patient received this from nursing staff. I encouraged her to call her psychiatrist this Monday to let them know she received this medication in the  emergency department. Will start the patient on Zantac for possible acid reflux causing her this chest pain. I discussed return precautions with the patient. I advised the patient to follow-up with their primary care provider this week. I advised the patient to return to the emergency department with new or worsening symptoms or new concerns. The patient verbalized understanding and agreement with plan.     Final Clinical Impressions(s) / ED Diagnoses   Final diagnoses:  Nonspecific chest pain  Recurrent major depressive disorder, in remission (HCC)    New Prescriptions New Prescriptions   RANITIDINE (ZANTAC) 150 MG CAPSULE    Take 1 capsule (150 mg total) by mouth daily.     Waynetta Pean, PA-C 01/04/16 1516    Blanchie Dessert, MD 01/04/16 2016

## 2016-01-15 ENCOUNTER — Encounter (HOSPITAL_COMMUNITY): Payer: Self-pay | Admitting: *Deleted

## 2016-01-15 ENCOUNTER — Emergency Department (HOSPITAL_COMMUNITY): Payer: Medicaid Other

## 2016-01-15 ENCOUNTER — Emergency Department (HOSPITAL_COMMUNITY)
Admission: EM | Admit: 2016-01-15 | Discharge: 2016-01-15 | Disposition: A | Discharge status: Home / Self Care | Payer: Medicaid Other | Attending: Emergency Medicine | Admitting: Emergency Medicine

## 2016-01-15 DIAGNOSIS — F17210 Nicotine dependence, cigarettes, uncomplicated: Secondary | ICD-10-CM | POA: Diagnosis not present

## 2016-01-15 DIAGNOSIS — Z79899 Other long term (current) drug therapy: Secondary | ICD-10-CM | POA: Diagnosis not present

## 2016-01-15 DIAGNOSIS — R109 Unspecified abdominal pain: Secondary | ICD-10-CM

## 2016-01-15 DIAGNOSIS — I10 Essential (primary) hypertension: Secondary | ICD-10-CM | POA: Diagnosis not present

## 2016-01-15 DIAGNOSIS — Z7984 Long term (current) use of oral hypoglycemic drugs: Secondary | ICD-10-CM | POA: Diagnosis not present

## 2016-01-15 DIAGNOSIS — E119 Type 2 diabetes mellitus without complications: Secondary | ICD-10-CM | POA: Insufficient documentation

## 2016-01-15 DIAGNOSIS — R1031 Right lower quadrant pain: Secondary | ICD-10-CM | POA: Insufficient documentation

## 2016-01-15 LAB — BASIC METABOLIC PANEL
Anion gap: 10 (ref 5–15)
BUN: 11 mg/dL (ref 6–20)
CALCIUM: 9.7 mg/dL (ref 8.9–10.3)
CO2: 26 mmol/L (ref 22–32)
Chloride: 101 mmol/L (ref 101–111)
Creatinine, Ser: 0.88 mg/dL (ref 0.44–1.00)
GFR calc Af Amer: >60 mL/min (ref >60)
GLUCOSE: 158 mg/dL — AB (ref 65–99)
Potassium: 3.7 mmol/L (ref 3.5–5.1)
Sodium: 137 mmol/L (ref 135–145)

## 2016-01-15 LAB — CBC
HEMATOCRIT: 43.1 % (ref 36.0–46.0)
Hemoglobin: 14 g/dL (ref 12.0–15.0)
MCH: 26.8 pg (ref 26.0–34.0)
MCHC: 32.5 g/dL (ref 30.0–36.0)
MCV: 82.4 fL (ref 78.0–100.0)
Platelets: 286 10*3/uL (ref 150–400)
RBC: 5.23 MIL/uL — ABNORMAL HIGH (ref 3.87–5.11)
RDW: 14.7 % (ref 11.5–15.5)
WBC: 12.7 10*3/uL — ABNORMAL HIGH (ref 4.0–10.5)

## 2016-01-15 LAB — I-STAT TROPONIN, ED: TROPONIN I, POC: 0 ng/mL (ref 0.00–0.08)

## 2016-01-15 MED ORDER — NAPROXEN 500 MG PO TABS: 500.0000 mg | ORAL_TABLET | Freq: Two times a day (BID) | ORAL | 0 refills | Status: DC

## 2016-01-15 MED ORDER — KETOROLAC TROMETHAMINE 60 MG/2ML IM SOLN
60.0000 mg | Freq: Once | INTRAMUSCULAR | Status: AC
  Administered 2016-01-15: 60 mg via INTRAMUSCULAR
  Filled 2016-01-15: qty 2

## 2016-01-15 NOTE — ED Provider Notes (Signed)
[facility] DEPT Provider Note   CSN: [id number] Arrival date & time: 01/15/16  0133     History   Chief Complaint Chief Complaint  Patient presents with  . Abdominal Pain  . Chest Pain    HPI [name] is a 25 y.o. female.  Patient is a 25 year old female with past medical history of anxiety, bipolar, diabetes, and hypertension. She also has a history of chronic right lower abdominal pain. She tells me she has a "tumor". She reports being seen by general surgery yesterday and was told she will require surgery. She presents today complaining of increased pain unrelieved with the medications she is taking at home. She denies any fevers or chills. She denies any vomiting or diarrhea. Her pain is located in the right lower quadrant and is identical to the symptoms she has experienced for the past 12 months that has been the cause of many, many emergency department visits.  She has a care plan.      Past Medical History:  Diagnosis Date  . Anxiety   . Bipolar 1 disorder ([place])   . Cognitive deficits   . Depression   . Diabetes mellitus without complication ([place])   . Hypertension   . Mental disorder   . Obesity     Patient Active Problem List   Diagnosis Date Noted  . 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 Learning disability 07/18/2015  . 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 Diabetes mellitus ([place]) 07/18/2015  . 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 Hyperlipidemia 07/18/2015  . Drug overdose   . Cognitive deficits 10/12/2012  . Generalized anxiety disorder 06/28/2012    Past Surgical History:  Procedure Laterality Date  . CESAREAN SECTION    . CESAREAN SECTION N/A 04/25/2013   Procedure: REPEAT CESAREAN SECTION;  Surgeon: [name], MD;  Location: [facility];  Service: Obstetrics;  Laterality: N/A;  . MASS EXCISION N/A 06/03/2012   Procedure: EXCISION MASS;  Surgeon: [name], MD;   Location: [facility];  Service: ENT;  Laterality: N/A;  Excision uvula mass  . TONSILLECTOMY N/A 06/03/2012   Procedure: TONSILLECTOMY;  Surgeon: [name], MD;  Location: [facility];  Service: ENT;  Laterality: N/A;  . TONSILLECTOMY      OB History    Gravida Para Term Preterm AB Living   3 3 3  0 0 3   SAB TAB Ectopic Multiple Live Births   0 0 0 0 3       Home Medications    Prior to Admission medications   Medication Sig Start Date End Date Taking? Authorizing Provider  busPIRone (BUSPAR) 15 MG tablet Take 15 mg by mouth daily.    Historical Provider, MD  DULoxetine (CYMBALTA) 60 MG capsule Take 60 mg by mouth at bedtime.     Historical Provider, MD  fluvoxaMINE (LUVOX) 100 MG tablet Take 100 mg by mouth daily.     Historical Provider, MD  glipiZIDE (GLUCOTROL XL) 10 MG 24 hr tablet Take 10 mg by mouth daily with breakfast.    Historical Provider, MD  lidocaine (LIDODERM) 5 % Place 1 patch onto the skin daily. Remove & Discard patch within 12 hours or as directed by MD Patient not taking: Reported on 12/27/2015 12/24/15   April Palumbo, MD  metFORMIN (GLUCOPHAGE) 500 MG tablet Take 500 mg by mouth 2 (two) times daily with a meal.    Historical Provider, MD  naproxen (NAPROSYN) 500  MG tablet Take 1 tablet (500 mg total) by mouth 2 (two) times daily. Patient not taking: Reported on 12/24/2015 12/21/15   [name], PA-C  paliperidone (INVEGA SUSTENNA) 234 MG/1.5ML SUSP injection Inject 234 mg into the muscle every 30 (thirty) days.    Historical Provider, MD  ranitidine (ZANTAC) 150 MG capsule Take 1 capsule (150 mg total) by mouth daily. 01/04/16   Waynetta Pean, PA-C    Family History Family History  Problem Relation Age of Onset  . Hypertension Mother   . Diabetes Father     Social History Social History  Substance Use Topics  . Smoking status: Current Every Day Smoker    Packs/day: 1.00    Years: 7.00    Types: Cigarettes    Last attempt  to quit: 06/30/2015  . Smokeless tobacco: Never Used  . Alcohol use No     Allergies   Omnipaque [iohexol] and Penicillins   Review of Systems Review of Systems  All other systems reviewed and are negative.    Physical Exam Updated Vital Signs BP 137/82   Pulse 118   Temp 98.5 F (36.9 C) (Oral)   Resp 20   Ht 5\' 7"  (1.702 m)   Wt 267 lb (121.1 kg)   SpO2 97%   BMI 41.82 kg/m   Physical Exam  Constitutional: She is oriented to person, place, and time. She appears well-developed and well-nourished. No distress.  HENT:  Head: Normocephalic and atraumatic.  Neck: Normal range of motion. Neck supple.  Cardiovascular: Normal rate and regular rhythm.  Exam reveals no gallop and no friction rub.   No murmur heard. Pulmonary/Chest: Effort normal and breath sounds normal. No respiratory distress. She has no wheezes.  Abdominal: Soft. Bowel sounds are normal. She exhibits no distension. There is tenderness. There is no rebound and no guarding.  There is tenderness to palpation in the right lower quadrant and right upper quadrant.  Musculoskeletal: Normal range of motion.  Neurological: She is alert and oriented to person, place, and time.  Skin: Skin is warm and dry. She is not diaphoretic.  Nursing note and vitals reviewed.    ED Treatments / Results  Labs (all labs ordered are listed, but only abnormal results are displayed) Labs Reviewed  BASIC METABOLIC PANEL - Abnormal; Notable for the following:       Result Value   Glucose, Bld 158 (*)    All other components within normal limits  CBC - Abnormal; Notable for the following:    WBC 12.7 (*)    RBC 5.23 (*)    All other components within normal limits  I-STAT TROPOININ, ED    EKG  EKG Interpretation  Date/Time:  Wednesday January 15 2016 01:49:09 EDT Ventricular Rate:  111 PR Interval:  146 QRS Duration: 92 QT Interval:  344 QTC Calculation: 467 R Axis:   89 Text Interpretation:  Sinus tachycardia  Otherwise normal ECG No significant change from 01/04/2016 Confirmed by Trapper Meech  MD, [place] ([id number]) on 01/15/2016 5:13:44 AM       Radiology Dg Chest 2 View  Result Date: 01/15/2016 CLINICAL DATA:  Acute onset of shortness of breath and generalized chest pain. Initial encounter. EXAM: CHEST  2 VIEW COMPARISON:  Chest radiograph performed 01/04/2016 FINDINGS: The lungs are mildly hypoexpanded but appear grossly clear. There is no evidence of focal opacification, pleural effusion or pneumothorax. The heart is borderline normal in size. No acute osseous abnormalities are seen. IMPRESSION: Lungs mildly hypoexpanded but grossly clear.  Electronically Signed   By: Garald Balding M.D.   On: 01/15/2016 02:19    Procedures Procedures (including critical care time)  Medications Ordered in ED Medications  ketorolac (TORADOL) injection 60 mg (not administered)     Initial Impression / Assessment and Plan / ED Course  I have reviewed the triage vital signs and the nursing notes.  Pertinent labs & imaging results that were available during my care of the patient were reviewed by me and considered in my medical decision making (see chart for details).  Clinical Course    Patient presents with a flareup of her chronic right lower quadrant abdominal pain. She tells me she has a tumor which general surgery plans to operate on. Her exam today reveals tenderness in the right lower quadrant. Today she does have a white count of 12.7, the significance of which I am uncertain. She tells me this pain is identical to her chronic pain. I highly doubt appendicitis. She is afebrile. She will be discharged with naproxen as she has taken this in the past. She is to follow-up with her primary Dr. for further evaluation should her pain not improve  Final Clinical Impressions(s) / ED Diagnoses   Final diagnoses:  None    New Prescriptions New Prescriptions   No medications on file     Veryl Speak, MD 01/15/16  [phone]

## 2016-01-15 NOTE — ED Triage Notes (Signed)
Pt c/o chronic abdominal pain from an abdominal mass. Pt seen her for similar complaints. Pt states she is not getting any relief at home from her home medications.

## 2016-01-15 NOTE — ED Notes (Signed)
Signature pad not working, pt verbalized understanding of discharge instructions and medications, no questions at this time.  Pt given bus pass, and bag lunch.

## 2016-01-15 NOTE — Discharge Instructions (Signed)
Naproxen as prescribed as needed for pain.  Please follow-up with your surgeon or primary Dr. for future refills of your pain medications.

## 2016-01-16 ENCOUNTER — Emergency Department (HOSPITAL_COMMUNITY)
Admission: EM | Admit: 2016-01-16 | Discharge: 2016-01-16 | Disposition: A | Discharge status: Home / Self Care | Payer: Medicaid Other | Attending: Dermatology | Admitting: Dermatology

## 2016-01-16 ENCOUNTER — Telehealth: Payer: Self-pay | Admitting: *Deleted

## 2016-01-16 ENCOUNTER — Encounter (HOSPITAL_COMMUNITY): Payer: Self-pay

## 2016-01-16 DIAGNOSIS — I10 Essential (primary) hypertension: Secondary | ICD-10-CM | POA: Diagnosis not present

## 2016-01-16 DIAGNOSIS — R197 Diarrhea, unspecified: Secondary | ICD-10-CM | POA: Insufficient documentation

## 2016-01-16 DIAGNOSIS — Z5321 Procedure and treatment not carried out due to patient leaving prior to being seen by health care provider: Secondary | ICD-10-CM | POA: Diagnosis not present

## 2016-01-16 DIAGNOSIS — Z7984 Long term (current) use of oral hypoglycemic drugs: Secondary | ICD-10-CM | POA: Diagnosis not present

## 2016-01-16 DIAGNOSIS — F17210 Nicotine dependence, cigarettes, uncomplicated: Secondary | ICD-10-CM | POA: Insufficient documentation

## 2016-01-16 DIAGNOSIS — R1030 Lower abdominal pain, unspecified: Secondary | ICD-10-CM | POA: Insufficient documentation

## 2016-01-16 DIAGNOSIS — Z79899 Other long term (current) drug therapy: Secondary | ICD-10-CM | POA: Insufficient documentation

## 2016-01-16 DIAGNOSIS — R112 Nausea with vomiting, unspecified: Secondary | ICD-10-CM | POA: Insufficient documentation

## 2016-01-16 DIAGNOSIS — E119 Type 2 diabetes mellitus without complications: Secondary | ICD-10-CM | POA: Diagnosis not present

## 2016-01-16 LAB — CBC
HEMATOCRIT: 40.5 % (ref 36.0–46.0)
HEMOGLOBIN: 13.1 g/dL (ref 12.0–15.0)
MCH: 26.3 pg (ref 26.0–34.0)
MCHC: 32.3 g/dL (ref 30.0–36.0)
MCV: 81.2 fL (ref 78.0–100.0)
Platelets: 256 10*3/uL (ref 150–400)
RBC: 4.99 MIL/uL (ref 3.87–5.11)
RDW: 14.7 % (ref 11.5–15.5)
WBC: 9.5 10*3/uL (ref 4.0–10.5)

## 2016-01-16 LAB — I-STAT BETA HCG BLOOD, ED (MC, WL, AP ONLY): I-stat hCG, quantitative: <5 m[IU]/mL (ref <5)

## 2016-01-16 LAB — COMPREHENSIVE METABOLIC PANEL
ALBUMIN: 4.7 g/dL (ref 3.5–5.0)
ALT: 32 U/L (ref 14–54)
ANION GAP: 10 (ref 5–15)
AST: 34 U/L (ref 15–41)
Alkaline Phosphatase: 52 U/L (ref 38–126)
BILIRUBIN TOTAL: 0.6 mg/dL (ref 0.3–1.2)
BUN: 17 mg/dL (ref 6–20)
CO2: 25 mmol/L (ref 22–32)
Calcium: 9.6 mg/dL (ref 8.9–10.3)
Chloride: 105 mmol/L (ref 101–111)
Creatinine, Ser: 0.97 mg/dL (ref 0.44–1.00)
GFR calc Af Amer: >60 mL/min (ref >60)
Glucose, Bld: 210 mg/dL — ABNORMAL HIGH (ref 65–99)
POTASSIUM: 3.7 mmol/L (ref 3.5–5.1)
Sodium: 140 mmol/L (ref 135–145)
TOTAL PROTEIN: 8.2 g/dL — AB (ref 6.5–8.1)

## 2016-01-16 LAB — LIPASE, BLOOD: Lipase: 27 U/L (ref 11–51)

## 2016-01-16 NOTE — ED Notes (Signed)
Pt and Pt's visitor demanding to speak w/ the Charge RN.  This Probation officer and Risk analyst attempted to explain to both that we will get her seen as quickly as possible and reiterated all care that has been provided.  Neither felt that our answers were sufficient and decided to leave.  Pt continually stating that she needs emergency surgery for her tumor/mass that she has had for around 1 year.  Both this Engineer, maintenance (IT) unsuccessfully attempted Patient recovery.

## 2016-01-16 NOTE — Telephone Encounter (Signed)
Pt called to inform us that [facility] is not accepting medicaid. She needs to be referred somewhere else. [place] and left message to have someone call us back to find out how to refer patient. [phone].

## 2016-01-16 NOTE — ED Notes (Signed)
Pt left after triage due to wait time

## 2016-01-16 NOTE — ED Triage Notes (Addendum)
Per EMS, Pt, from home, c/o chronic lower abdominal pain, nausea, and diarrhea x "over a year."  Pain score 10/10.  Pt has been seen numerous times for same and was seen at [facility] yesterday for same.  Pt has been taking home medications w/o relief.    Pt reports R "side pain" 1 week and nausea starting yesterday.  Pt reports that these symptoms are new.

## 2016-01-18 ENCOUNTER — Ambulatory Visit (HOSPITAL_COMMUNITY)
Admission: EM | Admit: 2016-01-18 | Discharge: 2016-01-18 | Disposition: A | Discharge status: Home / Self Care | Payer: Medicaid Other | Attending: Internal Medicine | Admitting: Internal Medicine

## 2016-01-18 ENCOUNTER — Encounter (HOSPITAL_COMMUNITY): Payer: Self-pay | Admitting: Emergency Medicine

## 2016-01-18 DIAGNOSIS — G8929 Other chronic pain: Secondary | ICD-10-CM | POA: Diagnosis not present

## 2016-01-18 DIAGNOSIS — R1031 Right lower quadrant pain: Secondary | ICD-10-CM | POA: Diagnosis not present

## 2016-01-18 DIAGNOSIS — R0789 Other chest pain: Secondary | ICD-10-CM | POA: Diagnosis not present

## 2016-01-18 DIAGNOSIS — R1032 Left lower quadrant pain: Secondary | ICD-10-CM

## 2016-01-18 MED ORDER — METHOCARBAMOL 500 MG PO TABS: 500.0000 mg | ORAL_TABLET | Freq: Two times a day (BID) | ORAL | 0 refills | Status: DC

## 2016-01-18 NOTE — ED Provider Notes (Signed)
CSN: [id number]     Arrival date & time 01/18/16  1159 History   First MD Initiated Contact with Patient 01/18/16 1214     Chief Complaint  Patient presents with  . Abdominal Pain  . Mass   (Consider location/radiation/quality/duration/timing/severity/associated sxs/prior Treatment) 25 year old overly obese female with a history of anxiety, bipolar 1 disorder, cognitive deficits, diabetes mellitus, hypertension, other mental disorders presents to the urgent care for chronic abdominal pain. She has had multiple visits to the emergency department over the past year for the abdominal pain. Tests include CTs of the abdomen and pelvis, ultrasounds, lab work and others which have been negative for pathology to cause her abdominal pain. She was most recently seen approximately 3 days ago with the same abdominal pain for which she presents today. All testing was negative. There are no new symptoms. This is the same pain for which she has been having for over a year. No changes. Denies known fever, vomiting, diarrhea or constipation. She is completely awake and alert. And while sitting in no acute distress.      Past Medical History:  Diagnosis Date  . Anxiety   . Bipolar 1 disorder ([place])   . Cognitive deficits   . Depression   . Diabetes mellitus without complication ([place])   . Hypertension   . Mental disorder   . Obesity    Past Surgical History:  Procedure Laterality Date  . CESAREAN SECTION    . CESAREAN SECTION N/A 04/25/2013   Procedure: REPEAT CESAREAN SECTION;  Surgeon: [name], MD;  Location: [facility];  Service: Obstetrics;  Laterality: N/A;  . MASS EXCISION N/A 06/03/2012   Procedure: EXCISION MASS;  Surgeon: [name], MD;  Location: [facility];  Service: ENT;  Laterality: N/A;  Excision uvula mass  . TONSILLECTOMY N/A 06/03/2012   Procedure: TONSILLECTOMY;  Surgeon: [name], MD;  Location: [facility];  Service: ENT;  Laterality: N/A;   . TONSILLECTOMY     Family History  Problem Relation Age of Onset  . Hypertension Mother   . Diabetes Father    Social History  Substance Use Topics  . Smoking status: Current Every Day Smoker    Packs/day: 1.00    Years: 7.00    Types: Cigarettes    Last attempt to quit: 06/30/2015  . Smokeless tobacco: Never Used  . Alcohol use No   OB History    Gravida Para Term Preterm AB Living   3 3 3  0 0 3   SAB TAB Ectopic Multiple Live Births   0 0 0 0 3     Review of Systems  Constitutional: Positive for activity change. Negative for fatigue and fever.  HENT: Negative.   Respiratory: Negative for cough, choking and chest tightness.   Cardiovascular: Positive for chest pain. Negative for leg swelling.  Gastrointestinal: Positive for abdominal pain. Negative for constipation, diarrhea, nausea and vomiting.  Genitourinary: Negative.   Musculoskeletal: Negative.   Skin: Negative.   Neurological: Negative for dizziness, syncope, facial asymmetry and speech difficulty.  Psychiatric/Behavioral: Positive for sleep disturbance. The patient is nervous/anxious.   All other systems reviewed and are negative.   Allergies  Omnipaque [iohexol] and Penicillins  Home Medications   Prior to Admission medications   Medication Sig Start Date End Date Taking? Authorizing Provider  DULoxetine (CYMBALTA) 60 MG capsule Take 60 mg by mouth at bedtime.    Yes Historical Provider, MD  hydrochlorothiazide (HYDRODIURIL) 25 MG tablet Take 25 mg  by mouth daily.   Yes Historical Provider, MD  busPIRone (BUSPAR) 15 MG tablet Take 15 mg by mouth daily.    Historical Provider, MD  fluvoxaMINE (LUVOX) 100 MG tablet Take 100 mg by mouth daily.     Historical Provider, MD  glipiZIDE (GLUCOTROL XL) 10 MG 24 hr tablet Take 10 mg by mouth daily with breakfast.    Historical Provider, MD  hydrOXYzine (VISTARIL) 25 MG capsule Take 25 mg by mouth 3 (three) times daily as needed for anxiety.    Historical Provider,  MD  metFORMIN (GLUCOPHAGE) 500 MG tablet Take 500 mg by mouth 2 (two) times daily with a meal.    Historical Provider, MD  methocarbamol (ROBAXIN) 500 MG tablet Take 1 tablet (500 mg total) by mouth 2 (two) times daily. 01/18/16   Janne Napoleon, NP  paliperidone (INVEGA SUSTENNA) 234 MG/1.5ML SUSP injection Inject 234 mg into the muscle every 30 (thirty) days.    Historical Provider, MD  ranitidine (ZANTAC) 150 MG capsule Take 1 capsule (150 mg total) by mouth daily. Patient not taking: Reported on 01/15/2016 01/04/16   Waynetta Pean, PA-C   Meds Ordered and Administered this Visit  Medications - No data to display  BP 135/100 (BP Location: Right Arm)   Pulse 114   Temp 98.1 F (36.7 C) (Oral)   Resp 18   SpO2 100%  No data found.   Physical Exam  Constitutional: She is oriented to person, place, and time. She appears well-developed and well-nourished. No distress.  HENT:  Head: Normocephalic and atraumatic.  Eyes: EOM are normal.  Neck: Normal range of motion. Neck supple.  Cardiovascular: Normal rate, regular rhythm and normal heart sounds.   Pulmonary/Chest: Effort normal and breath sounds normal. No respiratory distress. She exhibits tenderness.  Abdominal: Bowel sounds are normal. She exhibits no mass. There is no rebound.  Abdomen obese. Having the patient lie supine produces severe pain across the entire abdomen causing the patient to cry out.. Very light touch with just with the weight of the stethoscope produces pain to the abdomen. Light palpation to the skin by pressing 1-2 mm produces severe pain. Sometimes with distraction deeper palpation does not cause pain. The greatest amount of pain is produced with having the patient lie down or sit up.  Musculoskeletal: She exhibits no edema.  Lymphadenopathy:    She has no cervical adenopathy.  Neurological: She is alert and oriented to person, place, and time. She exhibits normal muscle tone.  Skin: Skin is warm and dry.   Psychiatric: She has a normal mood and affect.  Nursing note and vitals reviewed.   Urgent Care Course   Clinical Course    Procedures (including critical care time)  Labs Review Labs Reviewed - No data to display  Imaging Review No results found.   Visual Acuity Review  Right Eye Distance:   Left Eye Distance:   Bilateral Distance:    Right Eye Near:   Left Eye Near:    Bilateral Near:         MDM   1. Right lower quadrant abdominal pain   2. Chronic pain   3. Abdominal wall pain in both lower quadrants   4. Chest wall pain    The reason for your abdominal pain is not well understood. There is tenderness to your abdominal wall which include skin, fat and muscle. I think much of your pain is due to your muscles across her abdomen. Apply heat across her abdomen  this may help. And see your doctor Monday. Meds ordered this encounter  Medications  . methocarbamol (ROBAXIN) 500 MG tablet    Sig: Take 1 tablet (500 mg total) by mouth 2 (two) times daily.    Dispense:  20 tablet    Refill:  0    Order Specific Question:   Supervising Provider    Answer:   Sherlene Shams [id number]         Janne Napoleon, NP 01/18/16 [place], NP 01/18/16 1251

## 2016-01-18 NOTE — Discharge Instructions (Signed)
The reason for your abdominal pain is not well understood. There is tenderness to your abdominal wall which include skin, fat and muscle. I think much of your pain is due to your muscles across her abdomen. Apply heat across her abdomen this may help. And see your doctor Monday.

## 2016-01-18 NOTE — ED Triage Notes (Signed)
Pt here for chronic abd pain associated w/nausea, dizziness and back pain  Has been to [facility] ED multiple times for similar sx   A&O x4... NAD

## 2016-01-19 NOTE — ED Provider Notes (Signed)
[facility] DEPT Provider Note   CSN: [id number] Arrival date & time: 12/21/15  2126     History   Chief Complaint Chief Complaint  Patient presents with  . Abdominal Pain  . Weakness    HPI [name] is a 25 y.o. female.  HPI  Past Medical History:  Diagnosis Date  . Anxiety   . Bipolar 1 disorder ([place])   . Cognitive deficits   . Depression   . Diabetes mellitus without complication ([place])   . Hypertension   . Mental disorder   . Obesity     Patient Active Problem List   Diagnosis Date Noted  . 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 Learning disability 07/18/2015  . 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 Diabetes mellitus ([place]) 07/18/2015  . 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 Hyperlipidemia 07/18/2015  . Drug overdose   . Cognitive deficits 10/12/2012  . Generalized anxiety disorder 06/28/2012    Past Surgical History:  Procedure Laterality Date  . CESAREAN SECTION    . CESAREAN SECTION N/A 04/25/2013   Procedure: REPEAT CESAREAN SECTION;  Surgeon: [name], MD;  Location: [facility];  Service: Obstetrics;  Laterality: N/A;  . MASS EXCISION N/A 06/03/2012   Procedure: EXCISION MASS;  Surgeon: [name], MD;  Location: [facility];  Service: ENT;  Laterality: N/A;  Excision uvula mass  . TONSILLECTOMY N/A 06/03/2012   Procedure: TONSILLECTOMY;  Surgeon: [name], MD;  Location: [facility];  Service: ENT;  Laterality: N/A;  . TONSILLECTOMY      OB History    Gravida Para Term Preterm AB Living   3 3 3  0 0 3   SAB TAB Ectopic Multiple Live Births   0 0 0 0 3       Home Medications    Prior to Admission medications   Medication Sig Start Date End Date Taking? Authorizing Provider  busPIRone (BUSPAR) 15 MG tablet Take 15 mg by mouth daily.    Historical Provider, MD  DULoxetine (CYMBALTA) 60 MG capsule Take 60 mg by mouth at bedtime.      Historical Provider, MD  fluvoxaMINE (LUVOX) 100 MG tablet Take 100 mg by mouth daily.     Historical Provider, MD  glipiZIDE (GLUCOTROL XL) 10 MG 24 hr tablet Take 10 mg by mouth daily with breakfast.    Historical Provider, MD  hydrochlorothiazide (HYDRODIURIL) 25 MG tablet Take 25 mg by mouth daily.    Historical Provider, MD  hydrOXYzine (VISTARIL) 25 MG capsule Take 25 mg by mouth 3 (three) times daily as needed for anxiety.    Historical Provider, MD  metFORMIN (GLUCOPHAGE) 500 MG tablet Take 500 mg by mouth 2 (two) times daily with a meal.    Historical Provider, MD  methocarbamol (ROBAXIN) 500 MG tablet Take 1 tablet (500 mg total) by mouth 2 (two) times daily. 01/18/16   Janne Napoleon, NP  paliperidone (INVEGA SUSTENNA) 234 MG/1.5ML SUSP injection Inject 234 mg into the muscle every 30 (thirty) days.    Historical Provider, MD  ranitidine (ZANTAC) 150 MG capsule Take 1 capsule (150 mg total) by mouth daily. Patient not taking: Reported on 01/15/2016 01/04/16   Waynetta Pean, PA-C    Family History Family History  Problem Relation Age of Onset  . Hypertension Mother   . Diabetes Father     Social History Social History  Substance Use Topics  . Smoking status: Current Every Day Smoker  Packs/day: 1.00    Years: 7.00    Types: Cigarettes    Last attempt to quit: 06/30/2015  . Smokeless tobacco: Never Used  . Alcohol use No     Allergies   Omnipaque [iohexol] and Penicillins   Review of Systems Review of Systems  Respiratory: Negative for shortness of breath.   Cardiovascular: Negative for chest pain and palpitations.  Gastrointestinal: Positive for abdominal pain, nausea and vomiting. Negative for abdominal distention, blood in stool and diarrhea.  Genitourinary: Negative for dysuria.  All other systems reviewed and are negative.    Physical Exam Updated Vital Signs BP 143/93 (BP Location: Right Arm)   Pulse 96   Temp 99.1 F (37.3 C) (Oral)   Resp 18   Ht 5\' 7"   (1.702 m)   Wt 118 kg   LMP  (LMP Unknown)   SpO2 98%   BMI 40.73 kg/m   Physical Exam  Constitutional: She appears well-developed and well-nourished. No distress.  HENT:  Head: Normocephalic and atraumatic.  Eyes: Conjunctivae are normal.  Neck: Neck supple.  Cardiovascular: Normal rate and regular rhythm.   No murmur heard. Pulmonary/Chest: Effort normal and breath sounds normal. No respiratory distress.  Abdominal: Soft. She exhibits no distension and no mass. There is tenderness. There is no guarding.  Musculoskeletal: She exhibits no edema.  Neurological: She is alert.  Skin: Skin is warm and dry.  Psychiatric: She has a normal mood and affect.  Nursing note and vitals reviewed.    ED Treatments / Results  Labs (all labs ordered are listed, but only abnormal results are displayed) Labs Reviewed  CBC - Abnormal; Notable for the following:       Result Value   MCH 25.6 (*)    All other components within normal limits  URINALYSIS, ROUTINE W REFLEX MICROSCOPIC (NOT AT [facility]) - Abnormal; Notable for the following:    APPearance CLOUDY (*)    Bilirubin Urine SMALL (*)    All other components within normal limits  LIPASE, BLOOD  COMPREHENSIVE METABOLIC PANEL  CBG MONITORING, ED    EKG  EKG Interpretation None       Radiology No results found.  Procedures Procedures (including critical care time)  Medications Ordered in ED Medications  ketorolac (TORADOL) tablet 10 mg (10 mg Oral Given 12/21/15 2331)  ondansetron (ZOFRAN-ODT) disintegrating tablet 4 mg (4 mg Oral Given 12/21/15 2331)     Initial Impression / Assessment and Plan / ED Course  I have reviewed the triage vital signs and the nursing notes.  Pertinent labs & imaging results that were available during my care of the patient were reviewed by me and considered in my medical decision making (see chart for details).  Clinical Course    Patient is a 25 year old female with a past medical history of  anxiety, bipolar, cognitive deficits, diabetes and hypertension who comes in today complaining of abdominal pain.  Patient states his pain has been going on for well over a year and she believes she needs surgery to remove what she believes to be a mass from her abdomen.  Patient's had multiple visits for the same.  Patient states his pain is 10 out of 10 there are no alleviating factors she states made worse by palpation or movement.  Patient states she has been taking her medications without relief.  Patient denies fevers chills diarrhea but endorses some nausea and vomiting.  Patient indicates that her pain is in the right lower quadrant and identical  to previous symptoms.  Physical exam patient clear to auscultation bilaterally normal S1-S2 no rubs murmurs gallops abdomen soft with tenderness palpation right lower quadrant and right upper quadrant.  We will collect urinalysis lipase CMP and CBC and reevaluate.  Patient's laboratory workup largely within normal limits.  I have offered patient pain medication instructed her follow-up with her PCP.   Final Clinical Impressions(s) / ED Diagnoses   Final diagnoses:  Generalized abdominal pain    New Prescriptions Discharge Medication List as of 12/21/2015 11:17 PM       Chapman Moss, MD 01/19/16 [id number]    Elnora Morrison, MD 01/21/16 1547

## 2016-02-02 ENCOUNTER — Encounter (HOSPITAL_COMMUNITY): Payer: Self-pay | Admitting: Nurse Practitioner

## 2016-02-02 ENCOUNTER — Emergency Department (HOSPITAL_COMMUNITY)
Admission: EM | Admit: 2016-02-02 | Discharge: 2016-02-03 | Disposition: A | Discharge status: Home / Self Care | Payer: Medicaid Other | Attending: Emergency Medicine | Admitting: Emergency Medicine

## 2016-02-02 DIAGNOSIS — Z79899 Other long term (current) drug therapy: Secondary | ICD-10-CM | POA: Insufficient documentation

## 2016-02-02 DIAGNOSIS — E119 Type 2 diabetes mellitus without complications: Secondary | ICD-10-CM | POA: Insufficient documentation

## 2016-02-02 DIAGNOSIS — F411 Generalized anxiety disorder: Secondary | ICD-10-CM | POA: Insufficient documentation

## 2016-02-02 DIAGNOSIS — I10 Essential (primary) hypertension: Secondary | ICD-10-CM | POA: Diagnosis not present

## 2016-02-02 DIAGNOSIS — F17210 Nicotine dependence, cigarettes, uncomplicated: Secondary | ICD-10-CM | POA: Insufficient documentation

## 2016-02-02 DIAGNOSIS — Z7984 Long term (current) use of oral hypoglycemic drugs: Secondary | ICD-10-CM | POA: Diagnosis not present

## 2016-02-02 DIAGNOSIS — R45851 Suicidal ideations: Secondary | ICD-10-CM | POA: Diagnosis present

## 2016-02-02 LAB — CBC
HCT: 38.8 % (ref 36.0–46.0)
Hemoglobin: 12.7 g/dL (ref 12.0–15.0)
MCH: 26.5 pg (ref 26.0–34.0)
MCHC: 32.7 g/dL (ref 30.0–36.0)
MCV: 81 fL (ref 78.0–100.0)
PLATELETS: 255 10*3/uL (ref 150–400)
RBC: 4.79 MIL/uL (ref 3.87–5.11)
RDW: 14.5 % (ref 11.5–15.5)
WBC: 9.9 10*3/uL (ref 4.0–10.5)

## 2016-02-02 LAB — COMPREHENSIVE METABOLIC PANEL
ALT: 39 U/L (ref 14–54)
ANION GAP: 12 (ref 5–15)
AST: 40 U/L (ref 15–41)
Albumin: 4.5 g/dL (ref 3.5–5.0)
Alkaline Phosphatase: 53 U/L (ref 38–126)
BILIRUBIN TOTAL: 0.6 mg/dL (ref 0.3–1.2)
BUN: 12 mg/dL (ref 6–20)
CHLORIDE: 101 mmol/L (ref 101–111)
CO2: 25 mmol/L (ref 22–32)
Calcium: 9.9 mg/dL (ref 8.9–10.3)
Creatinine, Ser: 0.79 mg/dL (ref 0.44–1.00)
Glucose, Bld: 266 mg/dL — ABNORMAL HIGH (ref 65–99)
POTASSIUM: 3.7 mmol/L (ref 3.5–5.1)
Sodium: 138 mmol/L (ref 135–145)
TOTAL PROTEIN: 8.2 g/dL — AB (ref 6.5–8.1)

## 2016-02-02 LAB — ACETAMINOPHEN LEVEL

## 2016-02-02 LAB — RAPID URINE DRUG SCREEN, HOSP PERFORMED
AMPHETAMINES: NOT DETECTED
BENZODIAZEPINES: NOT DETECTED
Barbiturates: NOT DETECTED
Cocaine: NOT DETECTED
OPIATES: NOT DETECTED
Tetrahydrocannabinol: NOT DETECTED

## 2016-02-02 LAB — I-STAT BETA HCG BLOOD, ED (MC, WL, AP ONLY)

## 2016-02-02 LAB — SALICYLATE LEVEL

## 2016-02-02 LAB — ETHANOL

## 2016-02-02 MED ORDER — GLIPIZIDE ER 10 MG PO TB24
10.0000 mg | ORAL_TABLET | Freq: Every day | ORAL | Status: DC
  Administered 2016-02-03: 10 mg via ORAL
  Filled 2016-02-02: qty 1

## 2016-02-02 MED ORDER — HYDROXYZINE HCL 25 MG PO TABS: 25.0000 mg | ORAL_TABLET | Freq: Three times a day (TID) | ORAL | Status: DC | PRN

## 2016-02-02 MED ORDER — HYDROXYZINE PAMOATE 25 MG PO CAPS: 25.0000 mg | ORAL_CAPSULE | Freq: Three times a day (TID) | ORAL | Status: DC | PRN

## 2016-02-02 MED ORDER — ONDANSETRON HCL 4 MG PO TABS: 4.0000 mg | ORAL_TABLET | Freq: Three times a day (TID) | ORAL | Status: DC | PRN

## 2016-02-02 MED ORDER — METFORMIN HCL 500 MG PO TABS
500.0000 mg | ORAL_TABLET | Freq: Two times a day (BID) | ORAL | Status: DC
  Administered 2016-02-03: 500 mg via ORAL
  Filled 2016-02-02: qty 1

## 2016-02-02 MED ORDER — ACETAMINOPHEN 325 MG PO TABS: 650.0000 mg | ORAL_TABLET | ORAL | Status: DC | PRN

## 2016-02-02 MED ORDER — ALUM & MAG HYDROXIDE-SIMETH 200-200-20 MG/5ML PO SUSP: 30.0000 mL | ORAL | Status: DC | PRN

## 2016-02-02 MED ORDER — FLUVOXAMINE MALEATE 100 MG PO TABS: 100.0000 mg | ORAL_TABLET | Freq: Every day | ORAL | Status: DC

## 2016-02-02 MED ORDER — HYDROCHLOROTHIAZIDE 25 MG PO TABS
25.0000 mg | ORAL_TABLET | Freq: Every day | ORAL | Status: DC
  Administered 2016-02-03: 25 mg via ORAL
  Filled 2016-02-02 (×2): qty 1

## 2016-02-02 MED ORDER — IBUPROFEN 200 MG PO TABS: 600.0000 mg | ORAL_TABLET | Freq: Three times a day (TID) | ORAL | Status: DC | PRN

## 2016-02-02 MED ORDER — DULOXETINE HCL 60 MG PO CPEP: 60.0000 mg | ORAL_CAPSULE | Freq: Every day | ORAL | Status: DC

## 2016-02-02 MED ORDER — BUSPIRONE HCL 10 MG PO TABS: 15.0000 mg | ORAL_TABLET | Freq: Every evening | ORAL | Status: DC

## 2016-02-02 NOTE — ED Notes (Signed)
Pt in purple paper scrubs and belongings secured and placed near nurses station. Pt wanded by security.

## 2016-02-02 NOTE — ED Notes (Signed)
Bed: [facility]PT4 Expected date:  Expected time:  Means of arrival:  Comments: 

## 2016-02-02 NOTE — ED Provider Notes (Signed)
[facility] DEPT Provider Note   CSN: [id number] Arrival date & time: 02/02/16  2137  By signing my name below, I, Dolores Hoose, attest that this documentation has been prepared under the direction and in the presence of Lacretia Leigh, MD . Electronically Signed: Dolores Hoose, Scribe. 02/02/2016. 11:56 PM  History   Chief Complaint Chief Complaint  Patient presents with  . Suicidal   The history is provided by the patient. No language interpreter was used.    HPI Comments:  Stacy ALDERFER is a 25 y.o. female with PMHx of Bipolar 1, Depression, and Anxiety who presents to the Emergency Department with new worsening suicidal ideations beginning yesterday. Pt states that she has been having disagreements with someone she sees frequently at the bus stop which has exacerbated her suicidal ideations. She planned to overdose on her pills, which she has attempted in the past, but reports she has not yet enacted her plan. Pt endorses associated worsening depression. She denies any current auditory hallucinations. Pt is followed by [facility] for her typical symptoms.   Past Medical History:  Diagnosis Date  . Anxiety   . Bipolar 1 disorder ([place])   . Cognitive deficits   . Depression   . Diabetes mellitus without complication ([place])   . Hypertension   . Mental disorder   . Obesity     Patient Active Problem List   Diagnosis Date Noted  . 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 Learning disability 07/18/2015  . 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 Diabetes mellitus ([place]) 07/18/2015  . 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 Hyperlipidemia 07/18/2015  . Drug overdose   . Cognitive deficits 10/12/2012  . Generalized anxiety disorder 06/28/2012    Past Surgical History:  Procedure Laterality Date  . CESAREAN SECTION    . CESAREAN SECTION N/A 04/25/2013   Procedure: REPEAT CESAREAN SECTION;  Surgeon:  [name], MD;  Location: [facility];  Service: Obstetrics;  Laterality: N/A;  . MASS EXCISION N/A 06/03/2012   Procedure: EXCISION MASS;  Surgeon: [name], MD;  Location: [facility];  Service: ENT;  Laterality: N/A;  Excision uvula mass  . TONSILLECTOMY N/A 06/03/2012   Procedure: TONSILLECTOMY;  Surgeon: [name], MD;  Location: [facility];  Service: ENT;  Laterality: N/A;  . TONSILLECTOMY      OB History    Gravida Para Term Preterm AB Living   3 3 3  0 0 3   SAB TAB Ectopic Multiple Live Births   0 0 0 0 3       Home Medications    Prior to Admission medications   Medication Sig Start Date End Date Taking? Authorizing Provider  busPIRone (BUSPAR) 15 MG tablet Take 15 mg by mouth every evening.    Yes Historical Provider, MD  DULoxetine (CYMBALTA) 60 MG capsule Take 60 mg by mouth at bedtime.    Yes Historical Provider, MD  fluvoxaMINE (LUVOX) 100 MG tablet Take 100 mg by mouth at bedtime.    Yes Historical Provider, MD  glipiZIDE (GLUCOTROL XL) 10 MG 24 hr tablet Take 10 mg by mouth daily with breakfast.   Yes Historical Provider, MD  hydrochlorothiazide (HYDRODIURIL) 25 MG tablet Take 25 mg by mouth daily.   Yes Historical Provider, MD  hydrOXYzine (VISTARIL) 25 MG capsule Take 25 mg by mouth 3 (three) times daily as needed for anxiety.   Yes Historical Provider, MD  metFORMIN (GLUCOPHAGE) 500 MG  tablet Take 500 mg by mouth 2 (two) times daily with a meal.   Yes Historical Provider, MD  paliperidone (INVEGA SUSTENNA) 234 MG/1.5ML SUSP injection Inject 234 mg into the muscle every 30 (thirty) days.   Yes Historical Provider, MD  methocarbamol (ROBAXIN) 500 MG tablet Take 1 tablet (500 mg total) by mouth 2 (two) times daily. Patient not taking: Reported on 02/02/2016 01/18/16   Janne Napoleon, NP  ranitidine (ZANTAC) 150 MG capsule Take 1 capsule (150 mg total) by mouth daily. Patient not taking: Reported on 02/02/2016 01/04/16   Waynetta Pean, PA-C     Family History Family History  Problem Relation Age of Onset  . Hypertension Mother   . Diabetes Father     Social History Social History  Substance Use Topics  . Smoking status: Current Every Day Smoker    Packs/day: 1.00    Years: 7.00    Types: Cigarettes    Last attempt to quit: 06/30/2015  . Smokeless tobacco: Never Used  . Alcohol use No     Allergies   Omnipaque [iohexol] and Penicillins   Review of Systems Review of Systems  Psychiatric/Behavioral: Positive for dysphoric mood and suicidal ideas.  All other systems reviewed and are negative.    Physical Exam Updated Vital Signs BP 121/88 (BP Location: Right Arm)   Pulse 115   Temp 97.9 F (36.6 C) (Oral)   Resp 18   SpO2 100%   Physical Exam  Constitutional: She is oriented to person, place, and time. She appears well-developed and well-nourished.  Non-toxic appearance. No distress.  HENT:  Head: Normocephalic and atraumatic.  Eyes: Conjunctivae, EOM and lids are normal. Pupils are equal, round, and reactive to light.  Neck: Normal range of motion. Neck supple. No tracheal deviation present. No thyroid mass present.  Cardiovascular: Normal rate, regular rhythm and normal heart sounds.  Exam reveals no gallop.   No murmur heard. Pulmonary/Chest: Effort normal and breath sounds normal. No stridor. No respiratory distress. She has no decreased breath sounds. She has no wheezes. She has no rhonchi. She has no rales.  Abdominal: Soft. Normal appearance and bowel sounds are normal. She exhibits no distension. There is no tenderness. There is no rebound and no CVA tenderness.  Musculoskeletal: Normal range of motion. She exhibits no edema or tenderness.  Neurological: She is alert and oriented to person, place, and time. She has normal strength. No cranial nerve deficit or sensory deficit. GCS eye subscore is 4. GCS verbal subscore is 5. GCS motor subscore is 6.  Skin: Skin is warm and dry. No abrasion and no  rash noted.  Psychiatric: She has a normal mood and affect. Her speech is normal and behavior is normal.  Active suicidal ideation with plan to overdose on pills. Denies homicidal ideation.  No auditory hallucinations  Nursing note and vitals reviewed.   ED Treatments / Results  DIAGNOSTIC STUDIES:  Oxygen Saturation is 100% on RA, normal by my interpretation.    COORDINATION OF CARE:  11:53 PM Discussed treatment plan with pt at bedside which included psych evaluation and pt agreed to plan.  Labs (all labs ordered are listed, but only abnormal results are displayed) Labs Reviewed  COMPREHENSIVE METABOLIC PANEL - Abnormal; Notable for the following:       Result Value   Glucose, Bld 266 (*)    Total Protein 8.2 (*)    All other components within normal limits  ACETAMINOPHEN LEVEL - Abnormal; Notable for the following:  Acetaminophen (Tylenol), Serum <10 (*)    All other components within normal limits  ETHANOL  SALICYLATE LEVEL  CBC  URINE RAPID DRUG SCREEN, HOSP PERFORMED  I-STAT BETA HCG BLOOD, ED ([facility], [facility], [facility] ONLY)    EKG  EKG Interpretation None       Radiology No results found.  Procedures Procedures (including critical care time)  Medications Ordered in ED Medications  ondansetron (ZOFRAN) tablet 4 mg (not administered)  alum & mag hydroxide-simeth (MAALOX/MYLANTA) 200-200-20 MG/5ML suspension 30 mL (not administered)  ibuprofen (ADVIL,MOTRIN) tablet 600 mg (not administered)  acetaminophen (TYLENOL) tablet 650 mg (not administered)  busPIRone (BUSPAR) tablet 15 mg (not administered)  DULoxetine (CYMBALTA) DR capsule 60 mg (not administered)  fluvoxaMINE (LUVOX) tablet 100 mg (not administered)  glipiZIDE (GLUCOTROL XL) 24 hr tablet 10 mg (not administered)  hydrochlorothiazide (HYDRODIURIL) tablet 25 mg (not administered)  hydrOXYzine (VISTARIL) capsule 25 mg (not administered)  metFORMIN (GLUCOPHAGE) tablet 500 mg (not administered)     Initial  Impression / Assessment and Plan / ED Course  I have reviewed the triage vital signs and the nursing notes.  Pertinent labs & imaging results that were available during my care of the patient were reviewed by me and considered in my medical decision making (see chart for details).  Clinical Course    Patient medically clear for disposition by psychiatry  Final Clinical Impressions(s) / ED Diagnoses   Final diagnoses:  None    New Prescriptions New Prescriptions   No medications on file  I personally performed the services described in this documentation, which was scribed in my presence. The recorded information has been reviewed and is accurate.       Lacretia Leigh, MD 02/03/16 [phone]

## 2016-02-02 NOTE — ED Triage Notes (Signed)
Pt states she is having suicidal thoughts with a plan of overdosing on her prescription medications. Pt has a psychiatric hx.

## 2016-02-03 DIAGNOSIS — Z79899 Other long term (current) drug therapy: Secondary | ICD-10-CM | POA: Diagnosis not present

## 2016-02-03 DIAGNOSIS — F411 Generalized anxiety disorder: Secondary | ICD-10-CM | POA: Diagnosis not present

## 2016-02-03 DIAGNOSIS — F17210 Nicotine dependence, cigarettes, uncomplicated: Secondary | ICD-10-CM

## 2016-02-03 LAB — CBG MONITORING, ED: GLUCOSE-CAPILLARY: 204 mg/dL — AB (ref 65–99)

## 2016-02-03 NOTE — ED Notes (Addendum)
Pt reports that she does not have a ride home and she is afraid to take the bus.  Sts "the people are going to be waiting me."  Pt is referring to the same "people" that initially caused her to become SI.  Pt sts she does not have money for a taxi.   VM left for Social Work and consult placed.

## 2016-02-03 NOTE — BH Assessment (Addendum)
Tele Assessment Note   [name] is an 25 y.o. female.  -Clinician reviewed note by Dr. Zenia Resides.  Pt states that she has been having disagreements with someone she sees frequently at the bus stop which has exacerbated her suicidal ideations. She planned to overdose on her pills, which she has attempted in the past, but reports she has not yet enacted her plan. Pt endorses associated worsening depression. She denies any current auditory hallucinations. Pt is followed by [facility] for her typical symptoms.  Patient reports that she did call 911 to get help with getting to [facility].  She said that she had earlier had thoughts of overdosing on her own medications.  Patient says that she has been feeling that way for the last few days.  However at time of assessment she says she does not feel like killing herself.  Patient denies any HI or A/V hallucinations.  Patient says that she get an monthly Invega shot for which she is over due by a couple of weeks.  Patient gets her medications for psychiatric symptoms through [facility] and Dr. Rosine Door there.  Patient is supposed to start seeing a therapist through them soon but this has not yet started.  Patient is confident that she can get in with Dr. Rosine Door tomorrow (10/09).  Patient has been at [facility] for inpatient care in March & January of 2017 and in October of '16.  -Clinician discussed patient care with Lindon Romp, NP who recommends patient be observed for rest of tonight then seen by psychiatry for AM psych eval.  Diagnosis: Bipolar 1 d/o  Past Medical History:  Past Medical History:  Diagnosis Date  . Anxiety   . Bipolar 1 disorder ([place])   . Cognitive deficits   . Depression   . Diabetes mellitus without complication ([place])   . Hypertension   . Mental disorder   . Obesity     Past Surgical History:  Procedure Laterality Date  . CESAREAN SECTION    . CESAREAN SECTION N/A 04/25/2013   Procedure:  REPEAT CESAREAN SECTION;  Surgeon: [name], MD;  Location: [facility];  Service: Obstetrics;  Laterality: N/A;  . MASS EXCISION N/A 06/03/2012   Procedure: EXCISION MASS;  Surgeon: [name], MD;  Location: [place];  Service: ENT;  Laterality: N/A;  Excision uvula mass  . TONSILLECTOMY N/A 06/03/2012   Procedure: TONSILLECTOMY;  Surgeon: [name], MD;  Location: [place];  Service: ENT;  Laterality: N/A;  . TONSILLECTOMY      Family History:  Family History  Problem Relation Age of Onset  . Hypertension Mother   . Diabetes Father     Social History:  reports that she has been smoking Cigarettes.  She has a 7.00 pack-year smoking history. She has never used smokeless tobacco. She reports that she does not drink alcohol or use drugs.  Additional Social History:  Alcohol / Drug Use Pain Medications: Medication for ulcer Prescriptions: Invega shot once monthly (due for shot currently), Metformin, Glipicide; Cymbalta, Buspar, Luvox, high blood pressure meds;  Over the Counter: None History of alcohol / drug use?: No history of alcohol / drug abuse  CIWA: CIWA-Ar BP: 121/88 Pulse Rate: 115 COWS:    PATIENT STRENGTHS: (choose at least two) Ability for insight Average or above average intelligence Communication skills Motivation for treatment/growth Supportive family/friends  Allergies:  Allergies  Allergen Reactions  . Omnipaque [Iohexol] Swelling and Other (See Comments)  Reaction:  Eye swelling  . Penicillins Hives and Other (See Comments)    Has patient had a PCN reaction causing immediate rash, facial/tongue/throat swelling, SOB or lightheadedness with hypotension: No Has patient had a PCN reaction causing severe rash involving mucus membranes or skin necrosis: No Has patient had a PCN reaction that required hospitalization No Has patient had a PCN reaction occurring within the last 10 years: No If all of the above answers are  "NO", then may proceed with Cephalosporin use.    Home Medications:  (Not in a hospital admission)  OB/GYN Status:  No LMP recorded. Patient has had an implant.  General Assessment Data Location of Assessment: [facility] TTS Assessment: In system Is this a Tele or Face-to-Face Assessment?: Face-to-Face Is this an Initial Assessment or a Re-assessment for this encounter?: Initial Assessment Marital status: Married Is patient pregnant?: No Pregnancy Status: No Living Arrangements: Parent (Lives with mother.) Can pt return to current living arrangement?: Yes Admission Status: Voluntary Is patient capable of signing voluntary admission?: Yes Referral Source: Self/Family/Friend (Pt called 911 to get some help.) Insurance type: MCD     Crisis Care Plan Living Arrangements: Parent (Lives with mother.) Name of Psychiatrist: Dr. Rosine Door at [facility] Name of Therapist: [facility].  Education Status Is patient currently in school?: No Highest grade of school patient has completed: 11th grade  Risk to self with the past 6 months Suicidal Ideation: No-Not Currently/Within Last 6 Months Has patient been a risk to self within the past 6 months prior to admission? : No Suicidal Intent: No Has patient had any suicidal intent within the past 6 months prior to admission? : No Is patient at risk for suicide?: Yes Suicidal Plan?: Yes-Currently Present Has patient had any suicidal plan within the past 6 months prior to admission? : No Specify Current Suicidal Plan: Thoughts of overdosing Access to Means: Yes Specify Access to Suicidal Means: Medication What has been your use of drugs/alcohol within the last 12 months?: Denies Previous Attempts/Gestures: Yes How many times?: 2 Other Self Harm Risks: None Triggers for Past Attempts: Other personal contacts (Problems w/ ex boyfriend) Intentional Self Injurious Behavior: None Family Suicide History:  No Recent stressful life event(s): Conflict (Comment), Turmoil (Comment) ([place] person [place] bus [place] harassing her.) Persecutory voices/beliefs?: Yes Depression: Yes Depression Symptoms: Fatigue, Despondent, Tearfulness, Loss of interest in usual pleasures Substance abuse history and/or treatment for substance abuse?: No Suicide prevention information given to non-admitted patients: Not applicable  Risk to Others within the past 6 months Homicidal Ideation: No Does patient have any lifetime risk of violence toward others beyond the six months prior to admission? : No Thoughts of Harm to Others: No Current Homicidal Intent: No Current Homicidal Plan: No Access to Homicidal Means: No Identified Victim: No one History of harm to others?: No Assessment of Violence: None Noted Violent Behavior Description: Pt denies Does patient have access to weapons?: No Criminal Charges Pending?: No Does patient have a court date: No Is patient on probation?: No  Psychosis Hallucinations: None noted Delusions: None noted  Mental Status Report Appearance/Hygiene: Unremarkable, In scrubs Eye Contact: Fair Motor Activity: Freedom of movement, Unremarkable Speech: Logical/coherent, Soft Level of Consciousness: Alert Mood: Depressed, Despair, Helpless, Sad Affect: Depressed, Blunted, Sad Anxiety Level: None Thought Processes: Coherent, Relevant Judgement: Unimpaired Orientation: Person, Place, Time, Situation Obsessive Compulsive Thoughts/Behaviors: None  Cognitive Functioning Concentration: Normal Memory: Recent Intact, Remote Intact IQ: Average Insight: Fair Impulse Control: Fair Appetite: Good Weight  Loss: 0 Weight Gain: 0 Sleep: No Change Total Hours of Sleep: 8 Vegetative Symptoms: None  ADLScreening [facility] Assessment Services) Patient's cognitive ability adequate to safely complete daily activities?: Yes Patient able to express need for assistance with ADLs?: Yes Independently  performs ADLs?: Yes (appropriate for developmental age)  Prior Inpatient Therapy Prior Inpatient Therapy: Yes Prior Therapy Dates: 03/17, 01/17, 01/2015 Prior Therapy Facilty/Provider(s): [facility] Reason for Treatment: SI  Prior Outpatient Therapy Prior Outpatient Therapy: Yes Prior Therapy Dates: Past year Prior Therapy Facilty/Provider(s): [facility] Reason for Treatment: med management Does patient have an ACCT team?: No Does patient have Intensive In-House Services?  : No Does patient have [facility] services? : No Does patient have P4CC services?: No  ADL Screening (condition at time of admission) Patient's cognitive ability adequate to safely complete daily activities?: Yes Is the patient deaf or have difficulty hearing?: No Does the patient have difficulty seeing, even when wearing glasses/contacts?: No Does the patient have difficulty concentrating, remembering, or making decisions?: No Patient able to express need for assistance with ADLs?: Yes Does the patient have difficulty dressing or bathing?: No Independently performs ADLs?: Yes (appropriate for developmental age) Does the patient have difficulty walking or climbing stairs?: No Weakness of Legs: None Weakness of Arms/Hands: None       Abuse/Neglect Assessment (Assessment to be complete while patient is alone) Physical Abuse: Denies Verbal Abuse: Denies Sexual Abuse: Denies Exploitation of patient/patient's resources: Denies Self-Neglect: Denies     Regulatory affairs officer (For Healthcare) Does patient have an advance directive?: No Would patient like information on creating an advanced directive?: No - patient declined information    Additional Information 1:1 In Past 12 Months?: No CIRT Risk: No Elopement Risk: No Does patient have medical clearance?: Yes     Disposition:  Disposition Initial Assessment Completed for this Encounter: Yes Disposition of Patient: Other dispositions Other  disposition(s): Other (Comment) (To be reviewed by NP)  Curlene Dolphin Ray 02/03/2016 2:53 AM

## 2016-02-03 NOTE — BH Assessment (Signed)
[facility] Assessment Progress Note  Per [name], MD, this pt does not require psychiatric hospitalization at this time.  Pt is to be discharged from [facility] with recommendation to follow up with [facility], her current outpatient provider..  This has been included in pt's discharge instructions.  Pt's nurse has been notified.  Jalene Mullet, [place] Triage Specialist [phone]

## 2016-02-03 NOTE — ED Notes (Signed)
Pt has been cleared by Psych and belongings returned.

## 2016-02-03 NOTE — ED Notes (Signed)
Pt reported that she would, now, accept a bus pass.  NS asked to contact SW to d/c consult.  Pt shown out of the department.

## 2016-02-03 NOTE — [facility] Suicide Risk Assessment (Signed)
Suicide Risk Assessment  Discharge Assessment   [facility] Discharge Suicide Risk Assessment   Principal Problem: Generalized anxiety disorder Discharge Diagnoses:  Patient Active Problem List   Diagnosis Date Noted  . Generalized anxiety disorder [F41.1] 06/28/2012    Priority: High  . 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 Learning disability [F81.9] 07/18/2015  . 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 Diabetes mellitus ([place]) [E11.9] 07/18/2015  . MDD (major depressive disorder), recurrent, severe, with psychosis ([place]) [F33.3] 07/18/2015  . Hyperlipidemia [E78.5] 07/18/2015  . Drug overdose [T50.901A]   . Cognitive deficits [R41.89] 10/12/2012    Total Time spent with patient: 45 minutes  Musculoskeletal: Strength & Muscle Tone: within normal limits Gait & Station: normal Patient leans: N/A  Psychiatric Specialty Exam: Physical Exam  Constitutional: She is oriented to person, place, and time. She appears well-developed and well-nourished.  HENT:  Head: Normocephalic.  Neck: Normal range of motion.  Respiratory: Effort normal.  Musculoskeletal: Normal range of motion.  Neurological: She is alert and oriented to person, place, and time.  Skin: Skin is warm and dry.  Psychiatric: Her speech is normal and behavior is normal. Judgment and thought content normal. Her mood appears anxious. Cognition and memory are normal.    Review of Systems  Constitutional: Negative.   HENT: Negative.   Eyes: Negative.   Respiratory: Negative.   Cardiovascular: Negative.   Gastrointestinal: Negative.   Genitourinary: Negative.   Musculoskeletal: Negative.   Skin: Negative.   Neurological: Negative.   Endo/Heme/Allergies: Negative.   Psychiatric/Behavioral: The patient is nervous/anxious.     Blood pressure 112/65, pulse 113, temperature 98.5 F (36.9 C), temperature source Oral, resp. rate 18, SpO2 93 %.There is no height or weight on file to calculate BMI.   General Appearance: Casual  Eye Contact:  Good  Speech:  Normal Rate  Volume:  Normal  Mood:  Anxious, mild  Affect:  Congruent  Thought Process:  Coherent and Descriptions of Associations: Intact  Orientation:  Full (Time, Place, and Person)  Thought Content:  WDL  Suicidal Thoughts:  No  Homicidal Thoughts:  No  Memory:  Immediate;   Good Recent;   Good Remote;   Good  Judgement:  Fair  Insight:  Fair  Psychomotor Activity:  Normal  Concentration:  Concentration: Good and Attention Span: Good  Recall:  Good  Fund of Knowledge:  Fair  Language:  Good  Akathisia:  No  Handed:  Right  AIMS (if indicated):     Assets:  Housing Leisure Time Physical Health Resilience Social Support  ADL's:  Intact  Cognition:  WNL  Sleep:       Mental Status Per Nursing Assessment::   On Admission:   suicidal ideations  Demographic Factors:  NA  Loss Factors: NA  Historical Factors: NA  Risk Reduction Factors:   Sense of responsibility to family, Living with another person, especially a relative, Positive social support and Positive therapeutic relationship  Continued Clinical Symptoms:  Anxiety, mild  Cognitive Features That Contribute To Risk:  None    Suicide Risk:  Minimal: No identifiable suicidal ideation.  Patients presenting with no risk factors but with morbid ruminations; may be classified as minimal risk based on the severity of the depressive symptoms    Plan Of Care/Follow-up recommendations:  Activity:  as tolerated Diet:  heart healthy diet  Parish Augustine, NP 02/03/2016, 10:09 AM

## 2016-02-03 NOTE — Consult Note (Signed)
[facility] Consult   Reason for Consult:  Suicidal ideations Referring Physician:  EDP Patient Identification: [name] MRN:  [id number] Principal Diagnosis: Generalized anxiety disorder Diagnosis:   Patient Active Problem List   Diagnosis Date Noted  . Generalized anxiety disorder [F41.1] 06/28/2012    Priority: High  . 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 Learning disability [F81.9] 07/18/2015  . 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 Diabetes mellitus ([place]) [E11.9] 07/18/2015  . MDD (major depressive disorder), recurrent, severe, with psychosis ([place]) [F33.3] 07/18/2015  . Hyperlipidemia [E78.5] 07/18/2015  . Drug overdose [T50.901A]   . Cognitive deficits [R41.89] 10/12/2012    Total Time spent with patient: 45 minutes  Subjective:   [name] is a 25 y.o. female patient does not warrant admission.  HPI:  25 yo female who got upset with someone at the bus stop and started having suicidal ideations.  Today, she is calm and cooperative.  No suicidal ideations or homicidal ideations, hallucinations, or alcohol/drug abuse.  She has an appointment on Thursday for her Invega injection with Dr. Wonda Amis.  Stable for discharge and agreeable.  Past Psychiatric History: depression, anxiety  Risk to Self: Suicidal Ideation: No-Not Currently/Within Last 6 Months Suicidal Intent: No Is patient at risk for suicide?: Yes Suicidal Plan?: Yes-Currently Present Specify Current Suicidal Plan: Thoughts of overdosing Access to Means: Yes Specify Access to Suicidal Means: Medication What has been your use of drugs/alcohol within the last 12 months?: Denies How many times?: 2 Other Self Harm Risks: None Triggers for Past Attempts: Other personal contacts (Problems w/ ex boyfriend) Intentional Self Injurious Behavior: None Risk to Others: Homicidal Ideation: No Thoughts of Harm to Others: No Current Homicidal Intent: No Current Homicidal  Plan: No Access to Homicidal Means: No Identified Victim: No one History of harm to others?: No Assessment of Violence: None Noted Violent Behavior Description: Pt denies Does patient have access to weapons?: No Criminal Charges Pending?: No Does patient have a court date: No Prior Inpatient Therapy: Prior Inpatient Therapy: Yes Prior Therapy Dates: 03/17, 01/17, 01/2015 Prior Therapy Facilty/Provider(s): [facility] Reason for Treatment: SI Prior Outpatient Therapy: Prior Outpatient Therapy: Yes Prior Therapy Dates: Past year Prior Therapy Facilty/Provider(s): [facility] Reason for Treatment: med management Does patient have an ACCT team?: No Does patient have Intensive In-House Services?  : No Does patient have [facility] services? : No Does patient have P4CC services?: No  Past Medical History:  Past Medical History:  Diagnosis Date  . Anxiety   . Bipolar 1 disorder ([place])   . Cognitive deficits   . Depression   . Diabetes mellitus without complication ([place])   . Hypertension   . Mental disorder   . Obesity     Past Surgical History:  Procedure Laterality Date  . CESAREAN SECTION    . CESAREAN SECTION N/A 04/25/2013   Procedure: REPEAT CESAREAN SECTION;  Surgeon: [name], MD;  Location: [facility];  Service: Obstetrics;  Laterality: N/A;  . MASS EXCISION N/A 06/03/2012   Procedure: EXCISION MASS;  Surgeon: [name], MD;  Location: [facility];  Service: ENT;  Laterality: N/A;  Excision uvula mass  . TONSILLECTOMY N/A 06/03/2012   Procedure: TONSILLECTOMY;  Surgeon: [name], MD;  Location: [facility];  Service: ENT;  Laterality: N/A;  . TONSILLECTOMY     Family History:  Family History  Problem Relation Age of Onset  . Hypertension Mother   . Diabetes Father  Family Psychiatric  History: none Social History:  History  Alcohol Use No     History  Drug Use No    Comment: Patient denies    Social History    Social History  . Marital status: Single    Spouse name: N/A  . Number of children: N/A  . Years of education: N/A   Social History Main Topics  . Smoking status: Current Every Day Smoker    Packs/day: 1.00    Years: 7.00    Types: Cigarettes    Last attempt to quit: 06/30/2015  . Smokeless tobacco: Never Used  . Alcohol use No  . Drug use: No     Comment: Patient denies  . Sexual activity: No   Other Topics Concern  . None   Social History Narrative  . None   Additional Social History:    Allergies:   Allergies  Allergen Reactions  . Omnipaque [Iohexol] Swelling and Other (See Comments)    Reaction:  Eye swelling  . Penicillins Hives and Other (See Comments)    Has patient had a PCN reaction causing immediate rash, facial/tongue/throat swelling, SOB or lightheadedness with hypotension: No Has patient had a PCN reaction causing severe rash involving mucus membranes or skin necrosis: No Has patient had a PCN reaction that required hospitalization No Has patient had a PCN reaction occurring within the last 10 years: No If all of the above answers are "NO", then may proceed with Cephalosporin use.    Labs:  Results for orders placed or performed during the hospital encounter of 02/02/16 (from the past 48 hour(s))  Comprehensive metabolic panel     Status: Abnormal   Collection Time: 02/02/16 10:00 PM  Result Value Ref Range   Sodium 138 135 - 145 mmol/L   Potassium 3.7 3.5 - 5.1 mmol/L   Chloride 101 101 - 111 mmol/L   CO2 25 22 - 32 mmol/L   Glucose, Bld 266 (H) 65 - 99 mg/dL   BUN 12 6 - 20 mg/dL   Creatinine, Ser 0.79 0.44 - 1.00 mg/dL   Calcium 9.9 8.9 - 10.3 mg/dL   Total Protein 8.2 (H) 6.5 - 8.1 g/dL   Albumin 4.5 3.5 - 5.0 g/dL   AST 40 15 - 41 U/L   ALT 39 14 - 54 U/L   Alkaline Phosphatase 53 38 - 126 U/L   Total Bilirubin 0.6 0.3 - 1.2 mg/dL   GFR calc non Af Amer >60 >60 mL/min   GFR calc Af Amer >60 >60 mL/min    Comment: (NOTE) The eGFR has  been calculated using the CKD EPI equation. This calculation has not been validated in all clinical situations. eGFR's persistently <60 mL/min signify possible Chronic Kidney Disease.    Anion gap 12 5 - 15  Ethanol     Status: None   Collection Time: 02/02/16 10:00 PM  Result Value Ref Range   Alcohol, Ethyl (B) <5 <5 mg/dL    Comment:        LOWEST DETECTABLE LIMIT FOR SERUM ALCOHOL IS 5 mg/dL FOR MEDICAL PURPOSES ONLY   Salicylate level     Status: None   Collection Time: 02/02/16 10:00 PM  Result Value Ref Range   Salicylate Lvl <0.9 2.8 - 30.0 mg/dL  Acetaminophen level     Status: Abnormal   Collection Time: 02/02/16 10:00 PM  Result Value Ref Range   Acetaminophen (Tylenol), Serum <10 (L) 10 - 30 ug/mL    Comment:  THERAPEUTIC CONCENTRATIONS VARY SIGNIFICANTLY. A RANGE OF 10-30 ug/mL MAY BE AN EFFECTIVE CONCENTRATION FOR MANY PATIENTS. HOWEVER, SOME ARE BEST TREATED AT CONCENTRATIONS OUTSIDE THIS RANGE. ACETAMINOPHEN CONCENTRATIONS >150 ug/mL AT 4 HOURS AFTER INGESTION AND >50 ug/mL AT 12 HOURS AFTER INGESTION ARE OFTEN ASSOCIATED WITH TOXIC REACTIONS.   cbc     Status: None   Collection Time: 02/02/16 10:00 PM  Result Value Ref Range   WBC 9.9 4.0 - 10.5 K/uL   RBC 4.79 3.87 - 5.11 MIL/uL   Hemoglobin 12.7 12.0 - 15.0 g/dL   HCT 38.8 36.0 - 46.0 %   MCV 81.0 78.0 - 100.0 fL   MCH 26.5 26.0 - 34.0 pg   MCHC 32.7 30.0 - 36.0 g/dL   RDW 14.5 11.5 - 15.5 %   Platelets 255 150 - 400 K/uL  Rapid urine drug screen (hospital performed)     Status: None   Collection Time: 02/02/16 10:36 PM  Result Value Ref Range   Opiates NONE DETECTED NONE DETECTED   Cocaine NONE DETECTED NONE DETECTED   Benzodiazepines NONE DETECTED NONE DETECTED   Amphetamines NONE DETECTED NONE DETECTED   Tetrahydrocannabinol NONE DETECTED NONE DETECTED   Barbiturates NONE DETECTED NONE DETECTED    Comment:        DRUG SCREEN FOR MEDICAL PURPOSES ONLY.  IF CONFIRMATION IS  NEEDED FOR ANY PURPOSE, NOTIFY LAB WITHIN 5 DAYS.        LOWEST DETECTABLE LIMITS FOR URINE DRUG SCREEN Drug Class       Cutoff (ng/mL) Amphetamine      1000 Barbiturate      200 Benzodiazepine   254 Tricyclics       270 Opiates          300 Cocaine          300 THC              50   I-Stat beta hCG blood, ED ([facility], [facility], [facility] only)     Status: None   Collection Time: 02/02/16 11:33 PM  Result Value Ref Range   I-stat hCG, quantitative <5.0 <5 mIU/mL   Comment 3            Comment:   GEST. AGE      CONC.  (mIU/mL)   <=1 WEEK        5 - 50     2 WEEKS       50 - 500     3 WEEKS       100 - 10,000     4 WEEKS     1,000 - 30,000        FEMALE AND NON-PREGNANT FEMALE:     LESS THAN 5 mIU/mL   CBG monitoring, ED     Status: Abnormal   Collection Time: 02/03/16  8:24 AM  Result Value Ref Range   Glucose-Capillary 204 (H) 65 - 99 mg/dL    Current Facility-Administered Medications  Medication Dose Route Frequency Provider Last Rate Last Dose  . acetaminophen (TYLENOL) tablet 650 mg  650 mg Oral Q4H PRN Lacretia Leigh, MD      . alum & mag hydroxide-simeth (MAALOX/MYLANTA) 200-200-20 MG/5ML suspension 30 mL  30 mL Oral PRN Lacretia Leigh, MD      . busPIRone (BUSPAR) tablet 15 mg  15 mg Oral QPM Lacretia Leigh, MD      . DULoxetine (CYMBALTA) DR capsule 60 mg  60 mg Oral QHS Lacretia Leigh, MD      .  fluvoxaMINE (LUVOX) tablet 100 mg  100 mg Oral QHS Lacretia Leigh, MD      . glipiZIDE (GLUCOTROL XL) 24 hr tablet 10 mg  10 mg Oral Q breakfast Lacretia Leigh, MD   10 mg at 02/03/16 0827  . hydrochlorothiazide (HYDRODIURIL) tablet 25 mg  25 mg Oral Daily Lacretia Leigh, MD   25 mg at 02/03/16 0259  . hydrOXYzine (ATARAX/VISTARIL) tablet 25 mg  25 mg Oral TID PRN Lacretia Leigh, MD      . ibuprofen (ADVIL,MOTRIN) tablet 600 mg  600 mg Oral Q8H PRN Lacretia Leigh, MD      . metFORMIN (GLUCOPHAGE) tablet 500 mg  500 mg Oral BID WC Lacretia Leigh, MD   500 mg at 02/03/16 0827  . ondansetron (ZOFRAN)  tablet 4 mg  4 mg Oral Q8H PRN Lacretia Leigh, MD       Current Outpatient Prescriptions  Medication Sig Dispense Refill  . busPIRone (BUSPAR) 15 MG tablet Take 15 mg by mouth every evening.     . DULoxetine (CYMBALTA) 60 MG capsule Take 60 mg by mouth at bedtime.   1  . fluvoxaMINE (LUVOX) 100 MG tablet Take 100 mg by mouth at bedtime.     Marland Kitchen glipiZIDE (GLUCOTROL XL) 10 MG 24 hr tablet Take 10 mg by mouth daily with breakfast.    . hydrochlorothiazide (HYDRODIURIL) 25 MG tablet Take 25 mg by mouth daily.    . hydrOXYzine (VISTARIL) 25 MG capsule Take 25 mg by mouth 3 (three) times daily as needed for anxiety.    . metFORMIN (GLUCOPHAGE) 500 MG tablet Take 500 mg by mouth 2 (two) times daily with a meal.    . paliperidone (INVEGA SUSTENNA) 234 MG/1.5ML SUSP injection Inject 234 mg into the muscle every 30 (thirty) days.    . methocarbamol (ROBAXIN) 500 MG tablet Take 1 tablet (500 mg total) by mouth 2 (two) times daily. (Patient not taking: Reported on 02/02/2016) 20 tablet 0  . ranitidine (ZANTAC) 150 MG capsule Take 1 capsule (150 mg total) by mouth daily. (Patient not taking: Reported on 02/02/2016) 30 capsule 0    Musculoskeletal: Strength & Muscle Tone: within normal limits Gait & Station: normal Patient leans: N/A  Psychiatric Specialty Exam: Physical Exam  Constitutional: She is oriented to person, place, and time. She appears well-developed and well-nourished.  HENT:  Head: Normocephalic.  Neck: Normal range of motion.  Respiratory: Effort normal.  Musculoskeletal: Normal range of motion.  Neurological: She is alert and oriented to person, place, and time.  Skin: Skin is warm and dry.  Psychiatric: Her speech is normal and behavior is normal. Judgment and thought content normal. Her mood appears anxious. Cognition and memory are normal.    Review of Systems  Constitutional: Negative.   HENT: Negative.   Eyes: Negative.   Respiratory: Negative.   Cardiovascular: Negative.    Gastrointestinal: Negative.   Genitourinary: Negative.   Musculoskeletal: Negative.   Skin: Negative.   Neurological: Negative.   Endo/Heme/Allergies: Negative.   Psychiatric/Behavioral: The patient is nervous/anxious.     Blood pressure 112/65, pulse 113, temperature 98.5 F (36.9 C), temperature source Oral, resp. rate 18, SpO2 93 %.There is no height or weight on file to calculate BMI.  General Appearance: Casual  Eye Contact:  Good  Speech:  Normal Rate  Volume:  Normal  Mood:  Anxious, mild  Affect:  Congruent  Thought Process:  Coherent and Descriptions of Associations: Intact  Orientation:  Full (Time, Place, and Person)  Thought Content:  WDL  Suicidal Thoughts:  No  Homicidal Thoughts:  No  Memory:  Immediate;   Good Recent;   Good Remote;   Good  Judgement:  Fair  Insight:  Fair  Psychomotor Activity:  Normal  Concentration:  Concentration: Good and Attention Span: Good  Recall:  Good  Fund of Knowledge:  Fair  Language:  Good  Akathisia:  No  Handed:  Right  AIMS (if indicated):     Assets:  Housing Leisure Time Physical Health Resilience Social Support  ADL's:  Intact  Cognition:  WNL  Sleep:        Treatment Plan Summary: Daily contact with patient to assess and evaluate symptoms and progress in treatment, Medication management and Plan general anxiety disorder:  -Crisis stabilization -Medication management:  Continued medical medications along with Buspar 15 mg at bedtime for anxiety, Cymbalta 60 mg at bedtime for depression, Luvox 100 mg daily for depression and anxiety, and Vistaril 25 mg TID PRN anxiety -Individual counseling  Disposition: No evidence of imminent risk to self or others at present.    Waylan Boga, NP 02/03/2016 10:00 AM  Patient seen face-to-face for psychiatric evaluation, chart reviewed and case discussed with the physician extender and developed treatment plan. Reviewed the information documented and agree with the  treatment plan. [name], MD

## 2016-02-03 NOTE — Discharge Instructions (Addendum)
Generalized Anxiety Disorder Generalized anxiety disorder (GAD) is a mental disorder. It interferes with life functions, including relationships, work, and school. GAD is different from normal anxiety, which everyone experiences at some point in their lives in response to specific life events and activities. Normal anxiety actually helps [place] prepare for and get through these life events and activities. Normal anxiety goes away after the event or activity is over.  GAD causes anxiety that is not necessarily related to specific events or activities. It also causes excess anxiety in proportion to specific events or activities. The anxiety associated with GAD is also difficult to control. GAD can vary from mild to severe. People with severe GAD can have intense waves of anxiety with physical symptoms (panic attacks).  SYMPTOMS The anxiety and worry associated with GAD are difficult to control. This anxiety and worry are related to many life events and activities and also occur more days than not for 6 months or longer. People with GAD also have three or more of the following symptoms (one or more in children):  Restlessness.   Fatigue.  Difficulty concentrating.   Irritability.  Muscle tension.  Difficulty sleeping or unsatisfying sleep. DIAGNOSIS GAD is diagnosed through an assessment by your health care provider. Your health care provider will ask you questions aboutyour mood,physical symptoms, and events in your life. Your health care provider may ask you about your medical history and use of alcohol or drugs, including prescription medicines. Your health care provider may also do a physical exam and blood tests. Certain medical conditions and the use of certain substances can cause symptoms similar to those associated with GAD. Your health care provider may refer you to a mental health specialist for further evaluation. TREATMENT The following therapies are usually used to treat GAD:    Medication. Antidepressant medication usually is prescribed for long-term daily control. Antianxiety medicines may be added in severe cases, especially when panic attacks occur.   Talk therapy (psychotherapy). Certain types of talk therapy can be helpful in treating GAD by providing support, education, and guidance. A form of talk therapy called cognitive behavioral therapy can teach you healthy ways to think about and react to daily life events and activities.  Stress managementtechniques. These include yoga, meditation, and exercise and can be very helpful when they are practiced regularly. A mental health specialist can help determine which treatment is best for you. Some people see improvement with one therapy. However, other people require a combination of therapies.   This information is not intended to replace advice given to you by your health care provider. Make sure you discuss any questions you have with your health care provider.   Document Released: 08/08/2012 Document Revised: 05/04/2014 Document Reviewed: 08/08/2012 Elsevier Interactive Patient Education [employer].     For your ongoing behavioral health needs, you are advised to continue treatment through [facility]:       [facility]      [place], [place] [address]       [phone]

## 2016-02-19 ENCOUNTER — Other Ambulatory Visit: Payer: Self-pay

## 2016-02-19 ENCOUNTER — Encounter (HOSPITAL_COMMUNITY): Payer: Self-pay | Admitting: Emergency Medicine

## 2016-02-19 ENCOUNTER — Emergency Department (HOSPITAL_COMMUNITY)
Admission: EM | Admit: 2016-02-19 | Discharge: 2016-02-19 | Disposition: A | Discharge status: Home / Self Care | Payer: Medicaid Other | Attending: Emergency Medicine | Admitting: Emergency Medicine

## 2016-02-19 DIAGNOSIS — R079 Chest pain, unspecified: Secondary | ICD-10-CM | POA: Diagnosis not present

## 2016-02-19 DIAGNOSIS — Z7984 Long term (current) use of oral hypoglycemic drugs: Secondary | ICD-10-CM | POA: Diagnosis not present

## 2016-02-19 DIAGNOSIS — I10 Essential (primary) hypertension: Secondary | ICD-10-CM | POA: Diagnosis not present

## 2016-02-19 DIAGNOSIS — F17210 Nicotine dependence, cigarettes, uncomplicated: Secondary | ICD-10-CM | POA: Insufficient documentation

## 2016-02-19 DIAGNOSIS — E119 Type 2 diabetes mellitus without complications: Secondary | ICD-10-CM | POA: Diagnosis not present

## 2016-02-19 DIAGNOSIS — J029 Acute pharyngitis, unspecified: Secondary | ICD-10-CM

## 2016-02-19 DIAGNOSIS — Z79899 Other long term (current) drug therapy: Secondary | ICD-10-CM | POA: Diagnosis not present

## 2016-02-19 NOTE — ED Notes (Signed)
Pt refused d/c signature

## 2016-02-19 NOTE — ED Triage Notes (Signed)
Per EMS pt c/o chest pain for month and cough 2 weeks and sore throat for 3 days. Pt has seen PCP for same c/o and had blood work done. Everything was clear but pt wants further evaluation. EMS did 12 lead unremarkable. Chest hurts when pressed on.

## 2016-02-19 NOTE — Discharge Instructions (Signed)
Please read attached information. If you experience any new or worsening signs or symptoms please return to the emergency room for evaluation. Please follow-up with your primary care provider or specialist as discussed. Please use medication prescribed only as directed and discontinue taking if you have any concerning signs or symptoms.   °

## 2016-02-19 NOTE — ED Provider Notes (Signed)
[place] DEPT Provider Note   CSN: [id number] Arrival date & time: 02/19/16  1528  By signing my name below, I, Rayna Sexton, attest that this documentation has been prepared under the direction and in the presence of [employer], PA-C.  Electronically Signed: Rayna Sexton, ED Scribe. 02/19/16. 4:35 PM.   History   Chief Complaint Chief Complaint  Patient presents with  . Chest Wall Pain  . Cough    HPI HPI Comments: [name] is a 25 y.o. female with a care plan in place who presents to the Emergency Department by ambulance complaining of mild sore throat x 4 days. She describes her pain as sharp and states it feels similar to prior strep throat. She reports a h/o tonsillectomy. She also reports intermittent, sharp, centralized CP x 2 months. She denies current CP and states it presents with "depression". Her last occurrence was just PTA en-route with EMS. Per EMS, pt had an EKG performed en-route which was nml. Pt is a former smoker. She denies a h/o MI. She denies fevers, chills, cough or other associated symptoms at this time.   The history is provided by the patient. No language interpreter was used.   Past Medical History:  Diagnosis Date  . Anxiety   . Bipolar 1 disorder ([place])   . Cognitive deficits   . Depression   . Diabetes mellitus without complication ([place])   . Hypertension   . Mental disorder   . Obesity     Patient Active Problem List   Diagnosis Date Noted  . 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 Learning disability 07/18/2015  . 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 Diabetes mellitus ([place]) 07/18/2015  . 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 Hyperlipidemia 07/18/2015  . Drug overdose   . Cognitive deficits 10/12/2012  . Generalized anxiety disorder 06/28/2012    Past Surgical History:  Procedure Laterality Date  . CESAREAN SECTION    . CESAREAN SECTION N/A 04/25/2013   Procedure: REPEAT CESAREAN SECTION;  Surgeon: [name], MD;  Location: [facility];  Service: Obstetrics;  Laterality: N/A;  . MASS EXCISION N/A 06/03/2012   Procedure: EXCISION MASS;  Surgeon: [name], MD;  Location: [facility];  Service: ENT;  Laterality: N/A;  Excision uvula mass  . TONSILLECTOMY N/A 06/03/2012   Procedure: TONSILLECTOMY;  Surgeon: [name], MD;  Location: [facility];  Service: ENT;  Laterality: N/A;  . TONSILLECTOMY      OB History    Gravida Para Term Preterm AB Living   3 3 3  0 0 3   SAB TAB Ectopic Multiple Live Births   0 0 0 0 3       Home Medications    Prior to Admission medications   Medication Sig Start Date End Date Taking? Authorizing Provider  busPIRone (BUSPAR) 15 MG tablet Take 15 mg by mouth every evening.     Historical Provider, MD  DULoxetine (CYMBALTA) 60 MG capsule Take 60 mg by mouth at bedtime.     Historical Provider, MD  fluvoxaMINE (LUVOX) 100 MG tablet Take 100 mg by mouth at bedtime.     Historical Provider, MD  glipiZIDE (GLUCOTROL XL) 10 MG 24 hr tablet Take 10 mg by mouth daily with breakfast.    Historical Provider, MD  hydrochlorothiazide (HYDRODIURIL) 25 MG tablet Take 25 mg by mouth daily.    Historical Provider, MD  hydrOXYzine (VISTARIL) 25 MG capsule Take 25 mg  by mouth 3 (three) times daily as needed for anxiety.    Historical Provider, MD  metFORMIN (GLUCOPHAGE) 500 MG tablet Take 500 mg by mouth 2 (two) times daily with a meal.    Historical Provider, MD  methocarbamol (ROBAXIN) 500 MG tablet Take 1 tablet (500 mg total) by mouth 2 (two) times daily. Patient not taking: Reported on 02/02/2016 01/18/16   Janne Napoleon, NP  paliperidone (INVEGA SUSTENNA) 234 MG/1.5ML SUSP injection Inject 234 mg into the muscle every 30 (thirty) days.    Historical Provider, MD  ranitidine (ZANTAC) 150 MG capsule Take 1 capsule (150 mg total) by mouth daily. Patient not taking: Reported on 02/02/2016 01/04/16    Waynetta Pean, PA-C    Family History Family History  Problem Relation Age of Onset  . Hypertension Mother   . Diabetes Father     Social History Social History  Substance Use Topics  . Smoking status: Current Every Day Smoker    Packs/day: 1.00    Years: 7.00    Types: Cigarettes    Last attempt to quit: 06/30/2015  . Smokeless tobacco: Never Used  . Alcohol use No     Allergies   Omnipaque [iohexol] and Penicillins   Review of Systems Review of Systems  Constitutional: Negative for chills and fever.  HENT: Positive for sore throat.   Respiratory: Negative for cough.   Cardiovascular: Positive for chest pain.    Physical Exam Updated Vital Signs BP 144/96   Pulse 112   Temp 98.3 F (36.8 C) (Oral)   Resp 20   SpO2 100%   Physical Exam  Constitutional: She is oriented to person, place, and time. She appears well-developed and well-nourished.  HENT:  Head: Normocephalic and atraumatic.  Mouth/Throat: Uvula is midline, oropharynx is clear and moist and mucous membranes are normal. No oropharyngeal exudate, posterior oropharyngeal edema, posterior oropharyngeal erythema or tonsillar abscesses.  Tonsils surgically absent.   Eyes: EOM are normal.  Neck: Normal range of motion.  Cardiovascular: Normal rate, regular rhythm and normal heart sounds.  Exam reveals no gallop and no friction rub.   No murmur heard. Pulmonary/Chest: Effort normal and breath sounds normal. No respiratory distress. She has no wheezes. She has no rales. She exhibits no tenderness.  Chest non-tender  Abdominal: Soft.  Musculoskeletal: Normal range of motion.  Neurological: She is alert and oriented to person, place, and time.  Skin: Skin is warm and dry.  Psychiatric: She has a normal mood and affect.  Nursing note and vitals reviewed.  ED Treatments / Results  Labs (all labs ordered are listed, but only abnormal results are displayed) Labs Reviewed - No data to display  EKG  EKG  Interpretation None       Radiology No results found.  Procedures Procedures  DIAGNOSTIC STUDIES: Oxygen Saturation is 100% on RA, normal by my interpretation.    COORDINATION OF CARE: 4:35 PM Discussed next steps with pt. Pt verbalized understanding and is agreeable with the plan.    Medications Ordered in ED Medications - No data to display  Initial Impression / Assessment and Plan / ED Course  I have reviewed the triage vital signs and the nursing notes.  Pertinent labs & imaging results that were available during my care of the patient were reviewed by me and considered in my medical decision making (see chart for details).  Clinical Course    Labs: none indicated  Imaging: none indicated  Consults: none  Therapeutics:   Assessment:  Plan: Patient presents with complaints of sore throat. No signs of infectious etiology on exam. Patient also reports intermittent chest pain for the last 2 months; this happens at times of stress. None presently, very low suspicion for ACS in this patient. Patient encouraged follow-up with primary care for reevaluation of sore throat and chest pain.. Pt given strict return precautions, verbalized understanding and agreement to today's plan and had no further questions or concerns at the time of discharge.      Final Clinical Impressions(s) / ED Diagnoses   Final diagnoses:  Sore throat    New Prescriptions Discharge Medication List as of 02/19/2016  4:38 PM       Okey Regal, PA-C 02/19/16 1700    Virgel Manifold, MD 02/23/16 1556

## 2016-03-04 ENCOUNTER — Encounter (HOSPITAL_COMMUNITY): Payer: Self-pay

## 2016-03-04 ENCOUNTER — Emergency Department (HOSPITAL_COMMUNITY)
Admission: EM | Admit: 2016-03-04 | Discharge: 2016-03-04 | Discharge status: Against Medical Advice | Payer: Medicaid Other | Attending: Emergency Medicine | Admitting: Emergency Medicine

## 2016-03-04 DIAGNOSIS — F17210 Nicotine dependence, cigarettes, uncomplicated: Secondary | ICD-10-CM | POA: Diagnosis not present

## 2016-03-04 DIAGNOSIS — Z7984 Long term (current) use of oral hypoglycemic drugs: Secondary | ICD-10-CM | POA: Diagnosis not present

## 2016-03-04 DIAGNOSIS — E119 Type 2 diabetes mellitus without complications: Secondary | ICD-10-CM | POA: Insufficient documentation

## 2016-03-04 DIAGNOSIS — I10 Essential (primary) hypertension: Secondary | ICD-10-CM | POA: Insufficient documentation

## 2016-03-04 DIAGNOSIS — R1084 Generalized abdominal pain: Secondary | ICD-10-CM | POA: Insufficient documentation

## 2016-03-04 NOTE — ED Provider Notes (Signed)
[facility] DEPT Provider Note   CSN: [id number] Arrival date & time: 03/04/16  1831     History   Chief Complaint Chief Complaint  Patient presents with  . Abdominal Pain  . Diarrhea    HPI [name] is a 25 y.o. female.  Patient is 25 yo F with PMH as listed below, frequently evaluated in ED for chronic abdominal pain, presenting with chief complaint of generalized abdominal pain x 1 year. Pain is described as constant and "crampy." Patient states pain is made worse by smoking cigarettes. She has tried nothing for relief. Rates pain 3/10. She denies fever, chills, nausea, vomiting, change in bowel habits, blood in stools, or dysuria. Patient states "I just wanted to get checked," and has no other complaints.      Past Medical History:  Diagnosis Date  . Anxiety   . Bipolar 1 disorder ([employer])   . Cognitive deficits   . Depression   . Diabetes mellitus without complication ([place])   . Hypertension   . Mental disorder   . Obesity     Patient Active Problem List   Diagnosis Date Noted  . 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 Learning disability 07/18/2015  . 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 Diabetes mellitus ([place]) 07/18/2015  . 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 Hyperlipidemia 07/18/2015  . Drug overdose   . Cognitive deficits 10/12/2012  . Generalized anxiety disorder 06/28/2012    Past Surgical History:  Procedure Laterality Date  . CESAREAN SECTION    . CESAREAN SECTION N/A 04/25/2013   Procedure: REPEAT CESAREAN SECTION;  Surgeon: [name], MD;  Location: [facility];  Service: Obstetrics;  Laterality: N/A;  . MASS EXCISION N/A 06/03/2012   Procedure: EXCISION MASS;  Surgeon: [name], MD;  Location: [facility];  Service: ENT;  Laterality: N/A;  Excision uvula mass  . TONSILLECTOMY N/A 06/03/2012   Procedure: TONSILLECTOMY;  Surgeon: [name], MD;  Location:  [facility];  Service: ENT;  Laterality: N/A;  . TONSILLECTOMY      OB History    Gravida Para Term Preterm AB Living   3 3 3  0 0 3   SAB TAB Ectopic Multiple Live Births   0 0 0 0 3       Home Medications    Prior to Admission medications   Medication Sig Start Date End Date Taking? Authorizing Provider  busPIRone (BUSPAR) 15 MG tablet Take 15 mg by mouth every evening.    Yes Historical Provider, MD  DULoxetine (CYMBALTA) 60 MG capsule Take 60 mg by mouth at bedtime.    Yes Historical Provider, MD  fluvoxaMINE (LUVOX) 100 MG tablet Take 100 mg by mouth at bedtime.     Historical Provider, MD  glipiZIDE (GLUCOTROL XL) 10 MG 24 hr tablet Take 10 mg by mouth daily with breakfast.    Historical Provider, MD  hydrochlorothiazide (HYDRODIURIL) 25 MG tablet Take 25 mg by mouth daily.    Historical Provider, MD  hydrOXYzine (VISTARIL) 25 MG capsule Take 25 mg by mouth 3 (three) times daily as needed for anxiety.    Historical Provider, MD  metFORMIN (GLUCOPHAGE) 500 MG tablet Take 500 mg by mouth 2 (two) times daily with a meal.    Historical Provider, MD  methocarbamol (ROBAXIN) 500 MG tablet Take 1 tablet (500 mg total) by mouth 2 (two) times daily. Patient not taking: Reported on 02/02/2016 01/18/16   Janne Napoleon,  NP  paliperidone (INVEGA SUSTENNA) 234 MG/1.5ML SUSP injection Inject 234 mg into the muscle every 30 (thirty) days.    Historical Provider, MD  ranitidine (ZANTAC) 150 MG capsule Take 1 capsule (150 mg total) by mouth daily. Patient not taking: Reported on 02/02/2016 01/04/16   Waynetta Pean, PA-C    Family History Family History  Problem Relation Age of Onset  . Hypertension Mother   . Diabetes Father     Social History Social History  Substance Use Topics  . Smoking status: Current Every Day Smoker    Packs/day: 1.00    Years: 7.00    Types: Cigarettes    Last attempt to quit: 06/30/2015  . Smokeless tobacco: Never Used  . Alcohol use No      Allergies   Omnipaque [iohexol] and Penicillins   Review of Systems Review of Systems  Constitutional: Negative for chills and fever.  HENT: Negative for ear pain and sore throat.   Eyes: Negative for pain and visual disturbance.  Respiratory: Negative for cough and shortness of breath.   Cardiovascular: Negative for chest pain, palpitations and leg swelling.  Gastrointestinal: Positive for abdominal pain. Negative for blood in stool, nausea and vomiting.  Genitourinary: Negative for dysuria, flank pain and hematuria.  Musculoskeletal: Negative for back pain and neck pain.  Skin: Negative for color change and rash.  Neurological: Negative for dizziness, seizures, syncope, weakness, numbness and headaches.     Physical Exam Updated Vital Signs BP 141/94 (BP Location: Left Arm)   Pulse 102   Temp 98.4 F (36.9 C) (Oral)   Resp 18   Ht 5\' 7"  (1.702 m)   Wt 77.1 kg   SpO2 100%   BMI 26.63 kg/m   Physical Exam  Constitutional:  Obese female, in no acute distress.  HENT:  Head: Normocephalic and atraumatic.  Mouth/Throat: Oropharynx is clear and moist.  Eyes: Conjunctivae are normal.  Neck: Normal range of motion.  Cardiovascular: Regular rhythm and intact distal pulses.   Mild tachycardia noted.  Pulmonary/Chest: Effort normal. No respiratory distress.  Abdominal: Soft. Bowel sounds are normal. She exhibits no distension. There is no tenderness. There is no guarding.  Musculoskeletal: Normal range of motion.  Neurological: She is alert.  Skin: Skin is warm and dry.  Psychiatric: She has a normal mood and affect.  Nursing note and vitals reviewed.    ED Treatments / Results  Labs (all labs ordered are listed, but only abnormal results are displayed) Labs Reviewed - No data to display  EKG  EKG Interpretation None       Radiology No results found.  Procedures Procedures (including critical care time)  Medications Ordered in ED Medications - No  data to display   Initial Impression / Assessment and Plan / ED Course  I have reviewed the triage vital signs and the nursing notes.  Pertinent labs & imaging results that were available during my care of the patient were reviewed by me and considered in my medical decision making (see chart for details).  Clinical Course    Patient is 25 yo F, frequently evaluated in ED for chronic abdominal pain, presenting with chief complaint of generalized abdominal pain x 1 year. Attributes pain to smoking cigarettes, and smoking cessation counseling provided. Patient is afebrile with unremarkable abdominal exam. Orders placed for repeat vitals prior to d/c, but patient eloped from ED.  Final Clinical Impressions(s) / ED Diagnoses   Final diagnoses:  Generalized abdominal pain    New  Prescriptions New Prescriptions   No medications on file     Rosilyn Mings II, [place] 03/04/16 2020    Orlie Dakin, MD 03/05/16 Berniece Salines    Orlie Dakin, MD 03/05/16 [id number]

## 2016-03-04 NOTE — ED Triage Notes (Signed)
Per EMS, Pt, from home, c/o abdominal pain x 1 year and diarrhea x 1.5 years.  Pain score 4/10.  Pt reported to EMS that pain is "due to smoking too much."

## 2016-03-06 ENCOUNTER — Encounter (HOSPITAL_COMMUNITY): Payer: Self-pay | Admitting: Emergency Medicine

## 2016-03-06 ENCOUNTER — Emergency Department (HOSPITAL_COMMUNITY)
Admission: EM | Admit: 2016-03-06 | Discharge: 2016-03-07 | Disposition: A | Discharge status: Home / Self Care | Payer: Medicaid Other | Attending: Emergency Medicine | Admitting: Emergency Medicine

## 2016-03-06 DIAGNOSIS — Z7984 Long term (current) use of oral hypoglycemic drugs: Secondary | ICD-10-CM | POA: Diagnosis not present

## 2016-03-06 DIAGNOSIS — R197 Diarrhea, unspecified: Secondary | ICD-10-CM | POA: Diagnosis not present

## 2016-03-06 DIAGNOSIS — E119 Type 2 diabetes mellitus without complications: Secondary | ICD-10-CM | POA: Diagnosis not present

## 2016-03-06 DIAGNOSIS — I10 Essential (primary) hypertension: Secondary | ICD-10-CM | POA: Diagnosis not present

## 2016-03-06 DIAGNOSIS — R000 Tachycardia, unspecified: Secondary | ICD-10-CM | POA: Diagnosis not present

## 2016-03-06 DIAGNOSIS — Z79899 Other long term (current) drug therapy: Secondary | ICD-10-CM | POA: Insufficient documentation

## 2016-03-06 DIAGNOSIS — R51 Headache: Secondary | ICD-10-CM | POA: Insufficient documentation

## 2016-03-06 DIAGNOSIS — Z87891 Personal history of nicotine dependence: Secondary | ICD-10-CM | POA: Diagnosis not present

## 2016-03-06 DIAGNOSIS — R112 Nausea with vomiting, unspecified: Secondary | ICD-10-CM | POA: Diagnosis present

## 2016-03-06 DIAGNOSIS — R519 Headache, unspecified: Secondary | ICD-10-CM

## 2016-03-06 LAB — CBC WITH DIFFERENTIAL/PLATELET
BASOS ABS: 0 10*3/uL (ref 0.0–0.1)
BASOS PCT: 0 %
Eosinophils Absolute: 0.5 10*3/uL (ref 0.0–0.7)
Eosinophils Relative: 4 %
HEMATOCRIT: 37.1 % (ref 36.0–46.0)
Hemoglobin: 12.3 g/dL (ref 12.0–15.0)
LYMPHS PCT: 30 %
Lymphs Abs: 3.5 10*3/uL (ref 0.7–4.0)
MCH: 26.5 pg (ref 26.0–34.0)
MCHC: 33.2 g/dL (ref 30.0–36.0)
MCV: 80 fL (ref 78.0–100.0)
Monocytes Absolute: 0.7 10*3/uL (ref 0.1–1.0)
Monocytes Relative: 6 %
NEUTROS ABS: 6.8 10*3/uL (ref 1.7–7.7)
NEUTROS PCT: 60 %
PLATELETS: 256 10*3/uL (ref 150–400)
RBC: 4.64 MIL/uL (ref 3.87–5.11)
RDW: 14.7 % (ref 11.5–15.5)
WBC: 11.5 10*3/uL — AB (ref 4.0–10.5)

## 2016-03-06 LAB — COMPREHENSIVE METABOLIC PANEL
ALBUMIN: 4.5 g/dL (ref 3.5–5.0)
ALT: 47 U/L (ref 14–54)
ANION GAP: 10 (ref 5–15)
AST: 55 U/L — ABNORMAL HIGH (ref 15–41)
Alkaline Phosphatase: 50 U/L (ref 38–126)
BILIRUBIN TOTAL: 1 mg/dL (ref 0.3–1.2)
BUN: 15 mg/dL (ref 6–20)
CO2: 26 mmol/L (ref 22–32)
Calcium: 9.7 mg/dL (ref 8.9–10.3)
Chloride: 102 mmol/L (ref 101–111)
Creatinine, Ser: 0.86 mg/dL (ref 0.44–1.00)
Glucose, Bld: 141 mg/dL — ABNORMAL HIGH (ref 65–99)
POTASSIUM: 4.3 mmol/L (ref 3.5–5.1)
Sodium: 138 mmol/L (ref 135–145)
TOTAL PROTEIN: 8.1 g/dL (ref 6.5–8.1)

## 2016-03-06 LAB — I-STAT BETA HCG BLOOD, ED (MC, WL, AP ONLY): I-stat hCG, quantitative: <5 m[IU]/mL (ref <5)

## 2016-03-06 MED ORDER — SODIUM CHLORIDE 0.9 % IV BOLUS (SEPSIS)
1000.0000 mL | Freq: Once | INTRAVENOUS | Status: AC
  Administered 2016-03-06: 1000 mL via INTRAVENOUS

## 2016-03-06 MED ORDER — KETOROLAC TROMETHAMINE 15 MG/ML IJ SOLN
15.0000 mg | Freq: Once | INTRAMUSCULAR | Status: AC
  Administered 2016-03-06: 15 mg via INTRAVENOUS
  Filled 2016-03-06: qty 1

## 2016-03-06 MED ORDER — METOCLOPRAMIDE HCL 5 MG/ML IJ SOLN
10.0000 mg | Freq: Once | INTRAMUSCULAR | Status: AC
Start: 2016-03-06 — End: 2016-03-06
  Administered 2016-03-06: 10 mg via INTRAVENOUS
  Filled 2016-03-06: qty 2

## 2016-03-06 NOTE — ED Triage Notes (Signed)
Per EMS pt reported nausea and vomiting that started yesterday. EMS administered 4mg  Zofran IVP and 148ml bolus of NS.

## 2016-03-06 NOTE — ED Notes (Signed)
Pt stated "my doctor told me they don't know what's wrong with my stomach.  They told me it was the cigarettes the last time I came here."

## 2016-03-07 NOTE — Discharge Instructions (Signed)
°  All the results in the ER are normal, labs and imaging. We are not sure what is causing your symptoms. The workup in the ER is not complete, and is limited to screening for life threatening and emergent conditions only, so please see a primary care doctor for further evaluation.  Also, you are noted to have elevated heart rate - please see your primary care doctor to look into that. Return to the ER if there is any chest pain, difficulty breathing or fainting spells.

## 2016-03-07 NOTE — ED Provider Notes (Addendum)
[facility] DEPT Provider Note   CSN: [id number] Arrival date & time: 03/06/16  2008     History   Chief Complaint Chief Complaint  Patient presents with  . Emesis    HPI [name] is a 25 y.o. female.  HPI Pt comes in with cc of headache, nausea, emesis. Pt reports that her headache started 2 days ago. Headache is frontal, sharp. No associated visual complains, seizures, altered mental status, loss of consciousness, new weakness, or numbness, no gait instability. Pt also c/o emesis and diarrhea. Pt has had 2 episodes of emesis today and 1 of diarrhea. No new abd pain- she reports having abd pain frequently. No fevers. Pt is noted to be tachycardic. She denies chest pain, dib. Pt has no hx of PE, DVT and denies any exogenous estrogen use, long distance travels or surgery in the past 6 weeks, active cancer, recent immobilization.    Past Medical History:  Diagnosis Date  . Anxiety   . Bipolar 1 disorder ([place])   . Cognitive deficits   . Depression   . Diabetes mellitus without complication ([place])   . Hypertension   . Mental disorder   . Obesity     Patient Active Problem List   Diagnosis Date Noted  . 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 Learning disability 07/18/2015  . 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 Diabetes mellitus ([place]) 07/18/2015  . 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 Hyperlipidemia 07/18/2015  . Drug overdose   . Cognitive deficits 10/12/2012  . Generalized anxiety disorder 06/28/2012    Past Surgical History:  Procedure Laterality Date  . CESAREAN SECTION    . CESAREAN SECTION N/A 04/25/2013   Procedure: REPEAT CESAREAN SECTION;  Surgeon: [name], MD;  Location: [facility];  Service: Obstetrics;  Laterality: N/A;  . MASS EXCISION N/A 06/03/2012   Procedure: EXCISION MASS;  Surgeon: [name], MD;  Location: [facility];  Service: ENT;  Laterality: N/A;   Excision uvula mass  . TONSILLECTOMY N/A 06/03/2012   Procedure: TONSILLECTOMY;  Surgeon: [name], MD;  Location: [facility];  Service: ENT;  Laterality: N/A;  . TONSILLECTOMY      OB History    Gravida Para Term Preterm AB Living   3 3 3  0 0 3   SAB TAB Ectopic Multiple Live Births   0 0 0 0 3       Home Medications    Prior to Admission medications   Medication Sig Start Date End Date Taking? Authorizing Provider  busPIRone (BUSPAR) 15 MG tablet Take 15 mg by mouth every evening.    Yes Historical Provider, MD  DULoxetine (CYMBALTA) 60 MG capsule Take 60 mg by mouth at bedtime.    Yes Historical Provider, MD  fluvoxaMINE (LUVOX) 100 MG tablet Take 100 mg by mouth at bedtime.    Yes Historical Provider, MD  glipiZIDE (GLUCOTROL XL) 10 MG 24 hr tablet Take 10 mg by mouth daily with breakfast.   Yes Historical Provider, MD  hydrochlorothiazide (HYDRODIURIL) 25 MG tablet Take 25 mg by mouth daily.   Yes Historical Provider, MD  hydrOXYzine (VISTARIL) 25 MG capsule Take 25 mg by mouth 3 (three) times daily as needed for anxiety.   Yes Historical Provider, MD  metFORMIN (GLUCOPHAGE) 500 MG tablet Take 500 mg by mouth 2 (two) times daily with a meal.   Yes Historical Provider, MD  paliperidone (INVEGA SUSTENNA) 234 MG/1.5ML SUSP injection  Inject 234 mg into the muscle every 30 (thirty) days.   Yes Historical Provider, MD  methocarbamol (ROBAXIN) 500 MG tablet Take 1 tablet (500 mg total) by mouth 2 (two) times daily. Patient not taking: Reported on 02/02/2016 01/18/16   Janne Napoleon, NP  ranitidine (ZANTAC) 150 MG capsule Take 1 capsule (150 mg total) by mouth daily. Patient not taking: Reported on 02/02/2016 01/04/16   Waynetta Pean, PA-C    Family History Family History  Problem Relation Age of Onset  . Hypertension Mother   . Diabetes Father     Social History Social History  Substance Use Topics  . Smoking status: Former Smoker    Packs/day: 1.00    Years:  7.00    Types: Cigarettes    Quit date: 06/30/2015  . Smokeless tobacco: Never Used  . Alcohol use No     Allergies   Omnipaque [iohexol] and Penicillins   Review of Systems Review of Systems  ROS 10 Systems reviewed and are negative for acute change except as noted in the HPI.     Physical Exam Updated Vital Signs BP 134/71 (BP Location: Right Arm)   Pulse 119   Temp 98.2 F (36.8 C) (Oral)   Resp 18   Ht 5\' 7"  (1.702 m)   Wt 260 lb (117.9 kg)   SpO2 96%   BMI 40.72 kg/m   Physical Exam  Constitutional: She is oriented to person, place, and time. She appears well-developed.  HENT:  Head: Normocephalic and atraumatic.  Eyes: EOM are normal.  Neck: Normal range of motion. Neck supple.  Cardiovascular: Regular rhythm.   tachycardia  Pulmonary/Chest: Effort normal.  Abdominal: Bowel sounds are normal.  Neurological: She is alert and oriented to person, place, and time. No cranial nerve deficit. Coordination normal.  Skin: Skin is warm and dry.  Nursing note and vitals reviewed.    ED Treatments / Results  Labs (all labs ordered are listed, but only abnormal results are displayed) Labs Reviewed  COMPREHENSIVE METABOLIC PANEL - Abnormal; Notable for the following:       Result Value   Glucose, Bld 141 (*)    AST 55 (*)    All other components within normal limits  CBC WITH DIFFERENTIAL/PLATELET - Abnormal; Notable for the following:    WBC 11.5 (*)    All other components within normal limits  I-STAT BETA HCG BLOOD, ED ([facility], [facility], [facility] ONLY)    EKG  EKG Interpretation  Date/Time:  Saturday March 07 2016 01:50:27 EST Ventricular Rate:  107 PR Interval:    QRS Duration: 90 QT Interval:  352 QTC Calculation: 470 R Axis:   83 Text Interpretation:  Sinus tachycardia Borderline Q waves in lateral leads Borderline repolarization abnormality [id number] No significant change since last tracing Confirmed by Kathrynn Humble, MD, Thelma Comp [phone]) on 03/07/2016 1:53:46 AM         Radiology No results found.  Procedures Procedures (including critical care time)  Medications Ordered in ED Medications  ketorolac (TORADOL) 15 MG/ML injection 15 mg (15 mg Intravenous Given 03/06/16 2319)  metoCLOPramide (REGLAN) injection 10 mg (10 mg Intravenous Given 03/06/16 2315)  sodium chloride 0.9 % bolus 1,000 mL (1,000 mLs Intravenous New Bag/Given 03/06/16 2313)     Initial Impression / Assessment and Plan / ED Course  I have reviewed the triage vital signs and the nursing notes.  Pertinent labs & imaging results that were available during my care of the patient were reviewed by me and considered  in my medical decision making (see chart for details).  Clinical Course    Pt comes in with cc of headache, nausea and emesis. Her abd exam is benign, pt doesn't appear dehydrated and she has only had 2-3 episodes of emesis and associated loose BM. Headache is moderately severe, no neuro deficits. Pt has had headaches for a long time. We will give meds for the headache and reassess - but there are no red flags right now indicating emergent type of headache for conditions such as brain bleed, thrombosis, infection, tumor.  Pt also is tachycardic. Last 3 ekg also show tachycardia. No PE risk factors and pt denies chest pain, dib, fainting. - so we will not pursue the diagnosis this visit. EKG is not showing significant changes compared to last several ekgs. We discussed with her strict return precautions on PE and pt endorses understanding them.  Final Clinical Impressions(s) / ED Diagnoses   Final diagnoses:  Non-intractable vomiting with nausea, unspecified vomiting type  Nonintractable headache, unspecified chronicity pattern, unspecified headache type  Tachycardia    New Prescriptions New Prescriptions   No medications on file     Varney Biles, MD 03/07/16 [place], MD 03/07/16 [id number]

## 2016-03-11 ENCOUNTER — Emergency Department (HOSPITAL_COMMUNITY)
Admission: EM | Admit: 2016-03-11 | Discharge: 2016-03-11 | Disposition: A | Discharge status: Home / Self Care | Payer: Medicaid Other | Attending: Emergency Medicine | Admitting: Emergency Medicine

## 2016-03-11 ENCOUNTER — Encounter (HOSPITAL_COMMUNITY): Payer: Self-pay | Admitting: *Deleted

## 2016-03-11 DIAGNOSIS — E119 Type 2 diabetes mellitus without complications: Secondary | ICD-10-CM | POA: Diagnosis not present

## 2016-03-11 DIAGNOSIS — R197 Diarrhea, unspecified: Secondary | ICD-10-CM | POA: Diagnosis not present

## 2016-03-11 DIAGNOSIS — R112 Nausea with vomiting, unspecified: Secondary | ICD-10-CM | POA: Diagnosis present

## 2016-03-11 DIAGNOSIS — I10 Essential (primary) hypertension: Secondary | ICD-10-CM | POA: Diagnosis not present

## 2016-03-11 DIAGNOSIS — Z7984 Long term (current) use of oral hypoglycemic drugs: Secondary | ICD-10-CM | POA: Insufficient documentation

## 2016-03-11 DIAGNOSIS — Z87891 Personal history of nicotine dependence: Secondary | ICD-10-CM | POA: Diagnosis not present

## 2016-03-11 DIAGNOSIS — G8929 Other chronic pain: Secondary | ICD-10-CM

## 2016-03-11 DIAGNOSIS — R109 Unspecified abdominal pain: Secondary | ICD-10-CM

## 2016-03-11 LAB — COMPREHENSIVE METABOLIC PANEL
ALK PHOS: 45 U/L (ref 38–126)
ALT: 65 U/L — AB (ref 14–54)
AST: 67 U/L — AB (ref 15–41)
Albumin: 4.4 g/dL (ref 3.5–5.0)
Anion gap: 12 (ref 5–15)
BUN: 10 mg/dL (ref 6–20)
CALCIUM: 10.3 mg/dL (ref 8.9–10.3)
CHLORIDE: 100 mmol/L — AB (ref 101–111)
CO2: 28 mmol/L (ref 22–32)
CREATININE: 0.98 mg/dL (ref 0.44–1.00)
GFR calc non Af Amer: >60 mL/min (ref >60)
Glucose, Bld: 167 mg/dL — ABNORMAL HIGH (ref 65–99)
Potassium: 3.2 mmol/L — ABNORMAL LOW (ref 3.5–5.1)
SODIUM: 140 mmol/L (ref 135–145)
Total Bilirubin: 1 mg/dL (ref 0.3–1.2)
Total Protein: 7.7 g/dL (ref 6.5–8.1)

## 2016-03-11 LAB — CBC
HCT: 39.6 % (ref 36.0–46.0)
Hemoglobin: 13.1 g/dL (ref 12.0–15.0)
MCH: 26.8 pg (ref 26.0–34.0)
MCHC: 33.1 g/dL (ref 30.0–36.0)
MCV: 81.1 fL (ref 78.0–100.0)
PLATELETS: 273 10*3/uL (ref 150–400)
RBC: 4.88 MIL/uL (ref 3.87–5.11)
RDW: 14.7 % (ref 11.5–15.5)
WBC: 9.6 10*3/uL (ref 4.0–10.5)

## 2016-03-11 MED ORDER — PROMETHAZINE HCL 25 MG PO TABS: 25.0000 mg | ORAL_TABLET | Freq: Three times a day (TID) | ORAL | 0 refills | Status: DC | PRN

## 2016-03-11 NOTE — ED Triage Notes (Signed)
Pt reports n/v and fatigue, weakness and feeling lightheaded x 2 weeks.

## 2016-03-11 NOTE — ED Provider Notes (Signed)
[facility] DEPT Provider Note   CSN: [id number] Arrival date & time: 03/11/16  1625     History   Chief Complaint Chief Complaint  Patient presents with  . Emesis  . Weakness    HPI [name] is a 25 y.o. female.  The history is provided by the patient. No language interpreter was used.  Emesis    Weakness  Associated symptoms include vomiting.    [name] is a 25 y.o. female who presents to the Emergency Department complaining of N/V. She states last time she vomited was after eating a slice of pizza. She vomits 2-3 times daily and  when she eats. She has only vomited once today. She also endorses chronic lower abdominal pain and diarrhea that has been ongoing for over the last year. She denies any fevers, dysuria, vaginal discharge. She is not sexually active. She also endorses associated generalized weakness and intermittent blurred vision over the last 1.5 weeks, not currently occurring. She saw her PCP today that prescribed Zofran and that did seem to provide some relief.   Past Medical History:  Diagnosis Date  . Anxiety   . Bipolar 1 disorder ([place])   . Cognitive deficits   . Depression   . Diabetes mellitus without complication ([place])   . Hypertension   . Mental disorder   . Obesity     Patient Active Problem List   Diagnosis Date Noted  . 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 Learning disability 07/18/2015  . 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 Diabetes mellitus ([place]) 07/18/2015  . 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 Hyperlipidemia 07/18/2015  . Drug overdose   . Cognitive deficits 10/12/2012  . Generalized anxiety disorder 06/28/2012    Past Surgical History:  Procedure Laterality Date  . CESAREAN SECTION    . CESAREAN SECTION N/A 04/25/2013   Procedure: REPEAT CESAREAN SECTION;  Surgeon: [name], MD;  Location: [facility];  Service: Obstetrics;  Laterality: N/A;  .  MASS EXCISION N/A 06/03/2012   Procedure: EXCISION MASS;  Surgeon: [name], MD;  Location: [facility];  Service: ENT;  Laterality: N/A;  Excision uvula mass  . TONSILLECTOMY N/A 06/03/2012   Procedure: TONSILLECTOMY;  Surgeon: [name], MD;  Location: [facility];  Service: ENT;  Laterality: N/A;  . TONSILLECTOMY      OB History    Gravida Para Term Preterm AB Living   3 3 3  0 0 3   SAB TAB Ectopic Multiple Live Births   0 0 0 0 3       Home Medications    Prior to Admission medications   Medication Sig Start Date End Date Taking? Authorizing Provider  busPIRone (BUSPAR) 15 MG tablet Take 15 mg by mouth every evening.     Historical Provider, MD  DULoxetine (CYMBALTA) 60 MG capsule Take 60 mg by mouth at bedtime.     Historical Provider, MD  fluvoxaMINE (LUVOX) 100 MG tablet Take 100 mg by mouth at bedtime.     Historical Provider, MD  glipiZIDE (GLUCOTROL XL) 10 MG 24 hr tablet Take 10 mg by mouth daily with breakfast.    Historical Provider, MD  hydrochlorothiazide (HYDRODIURIL) 25 MG tablet Take 25 mg by mouth daily.    Historical Provider, MD  hydrOXYzine (VISTARIL) 25 MG capsule Take 25 mg by mouth 3 (three) times daily as needed for anxiety.    Historical Provider, MD  metFORMIN (GLUCOPHAGE)  500 MG tablet Take 500 mg by mouth 2 (two) times daily with a meal.    Historical Provider, MD  methocarbamol (ROBAXIN) 500 MG tablet Take 1 tablet (500 mg total) by mouth 2 (two) times daily. Patient not taking: Reported on 02/02/2016 01/18/16   Janne Napoleon, NP  paliperidone (INVEGA SUSTENNA) 234 MG/1.5ML SUSP injection Inject 234 mg into the muscle every 30 (thirty) days.    Historical Provider, MD  promethazine (PHENERGAN) 25 MG tablet Take 1 tablet (25 mg total) by mouth every 8 (eight) hours as needed for nausea or vomiting. 03/11/16   Quintella Reichert, MD  ranitidine (ZANTAC) 150 MG capsule Take 1 capsule (150 mg total) by mouth daily. Patient not  taking: Reported on 02/02/2016 01/04/16   Waynetta Pean, PA-C    Family History Family History  Problem Relation Age of Onset  . Hypertension Mother   . Diabetes Father     Social History Social History  Substance Use Topics  . Smoking status: Former Smoker    Packs/day: 1.00    Years: 7.00    Types: Cigarettes    Quit date: 06/30/2015  . Smokeless tobacco: Never Used  . Alcohol use No     Allergies   Omnipaque [iohexol] and Penicillins   Review of Systems Review of Systems  Gastrointestinal: Positive for vomiting.  Neurological: Positive for weakness.  All other systems reviewed and are negative.    Physical Exam Updated Vital Signs BP 142/88 (BP Location: Left Arm)   Pulse 99   Temp 98 F (36.7 C) (Oral)   Resp 16   Ht 5\' 7"  (1.702 m)   Wt 271 lb (122.9 kg)   SpO2 98%   BMI 42.44 kg/m   Physical Exam  Constitutional: She is oriented to person, place, and time. She appears well-developed and well-nourished.  HENT:  Head: Normocephalic and atraumatic.  Cardiovascular: Normal rate and regular rhythm.   No murmur heard. Pulmonary/Chest: Effort normal and breath sounds normal. No respiratory distress.  Abdominal: Soft. There is no rebound and no guarding.  Mild lower abdominal tenderness  Musculoskeletal: She exhibits no edema or tenderness.  Neurological: She is alert and oriented to person, place, and time.  Skin: Skin is warm and dry.  Psychiatric: She has a normal mood and affect. Her behavior is normal.  Nursing note and vitals reviewed.    ED Treatments / Results  Labs (all labs ordered are listed, but only abnormal results are displayed) Labs Reviewed  COMPREHENSIVE METABOLIC PANEL - Abnormal; Notable for the following:       Result Value   Potassium 3.2 (*)    Chloride 100 (*)    Glucose, Bld 167 (*)    AST 67 (*)    ALT 65 (*)    All other components within normal limits  CBC    EKG  EKG Interpretation None       Radiology No  results found.  Procedures Procedures (including critical care time)  Medications Ordered in ED Medications - No data to display   Initial Impression / Assessment and Plan / ED Course  I have reviewed the triage vital signs and the nursing notes.  Pertinent labs & imaging results that were available during my care of the patient were reviewed by me and considered in my medical decision making (see chart for details).  Clinical Course   Patient with history of chronic abdominal pain here with recurrent pain that is unchanged from her baseline as well as  report of vomiting for the last week and a half. She is well-hydrated on examination with no distress. She has mild hypokalemia. Plan to DC home with continued Zofran, will prescribe Phenergan prescription as well if she has any recurrent symptoms. She denies any dysuria cannot provide a urine sample at this time. Plan to DC home with PCP follow-up and return precautions.  Final Clinical Impressions(s) / ED Diagnoses   Final diagnoses:  Nausea vomiting and diarrhea  Chronic abdominal pain    New Prescriptions Discharge Medication List as of 03/11/2016  6:58 PM    START taking these medications   Details  promethazine (PHENERGAN) 25 MG tablet Take 1 tablet (25 mg total) by mouth every 8 (eight) hours as needed for nausea or vomiting., Starting Wed 03/11/2016, Print         Quintella Reichert, MD 03/11/16 2246

## 2016-03-17 ENCOUNTER — Emergency Department (HOSPITAL_COMMUNITY)
Admission: EM | Admit: 2016-03-17 | Discharge: 2016-03-17 | Disposition: A | Discharge status: Home / Self Care | Payer: Medicaid Other | Attending: Emergency Medicine | Admitting: Emergency Medicine

## 2016-03-17 ENCOUNTER — Encounter (HOSPITAL_COMMUNITY): Payer: Self-pay

## 2016-03-17 DIAGNOSIS — Z87891 Personal history of nicotine dependence: Secondary | ICD-10-CM | POA: Diagnosis not present

## 2016-03-17 DIAGNOSIS — E119 Type 2 diabetes mellitus without complications: Secondary | ICD-10-CM | POA: Insufficient documentation

## 2016-03-17 DIAGNOSIS — Z79899 Other long term (current) drug therapy: Secondary | ICD-10-CM | POA: Insufficient documentation

## 2016-03-17 DIAGNOSIS — K625 Hemorrhage of anus and rectum: Secondary | ICD-10-CM | POA: Insufficient documentation

## 2016-03-17 DIAGNOSIS — Z5321 Procedure and treatment not carried out due to patient leaving prior to being seen by health care provider: Secondary | ICD-10-CM | POA: Insufficient documentation

## 2016-03-17 DIAGNOSIS — Z7984 Long term (current) use of oral hypoglycemic drugs: Secondary | ICD-10-CM | POA: Insufficient documentation

## 2016-03-17 DIAGNOSIS — I10 Essential (primary) hypertension: Secondary | ICD-10-CM | POA: Insufficient documentation

## 2016-03-17 LAB — COMPREHENSIVE METABOLIC PANEL
ALK PHOS: 43 U/L (ref 38–126)
ALT: 44 U/L (ref 14–54)
AST: 50 U/L — AB (ref 15–41)
Albumin: 4.1 g/dL (ref 3.5–5.0)
Anion gap: 11 (ref 5–15)
BILIRUBIN TOTAL: 0.5 mg/dL (ref 0.3–1.2)
BUN: 10 mg/dL (ref 6–20)
CO2: 27 mmol/L (ref 22–32)
CREATININE: 0.94 mg/dL (ref 0.44–1.00)
Calcium: 9.5 mg/dL (ref 8.9–10.3)
Chloride: 102 mmol/L (ref 101–111)
GFR calc Af Amer: >60 mL/min (ref >60)
GLUCOSE: 114 mg/dL — AB (ref 65–99)
Potassium: 3.2 mmol/L — ABNORMAL LOW (ref 3.5–5.1)
Sodium: 140 mmol/L (ref 135–145)
TOTAL PROTEIN: 7.1 g/dL (ref 6.5–8.1)

## 2016-03-17 LAB — CBC
HCT: 37.2 % (ref 36.0–46.0)
Hemoglobin: 12 g/dL (ref 12.0–15.0)
MCH: 26.1 pg (ref 26.0–34.0)
MCHC: 32.3 g/dL (ref 30.0–36.0)
MCV: 80.9 fL (ref 78.0–100.0)
PLATELETS: 248 10*3/uL (ref 150–400)
RBC: 4.6 MIL/uL (ref 3.87–5.11)
RDW: 14.5 % (ref 11.5–15.5)
WBC: 7.9 10*3/uL (ref 4.0–10.5)

## 2016-03-17 LAB — TYPE AND SCREEN
ABO/RH(D): A POS
ANTIBODY SCREEN: NEGATIVE

## 2016-03-17 LAB — POC URINE PREG, ED: Preg Test, Ur: NEGATIVE

## 2016-03-17 NOTE — ED Triage Notes (Signed)
Pt arrives via [facility] a&ox 4 and ambulatory; Pt states she started having bloody stool today; pt states blood was as she wiped and seen in toilet; Pt states blood was bright red; Pt c/o abdominal pain at 6/10 on arrival.

## 2016-03-19 ENCOUNTER — Emergency Department (HOSPITAL_COMMUNITY)
Admission: EM | Admit: 2016-03-19 | Discharge: 2016-03-20 | Disposition: A | Discharge status: Home / Self Care | Payer: Medicaid Other | Attending: Emergency Medicine | Admitting: Emergency Medicine

## 2016-03-19 DIAGNOSIS — Z87891 Personal history of nicotine dependence: Secondary | ICD-10-CM | POA: Diagnosis not present

## 2016-03-19 DIAGNOSIS — E119 Type 2 diabetes mellitus without complications: Secondary | ICD-10-CM | POA: Diagnosis not present

## 2016-03-19 DIAGNOSIS — Z7984 Long term (current) use of oral hypoglycemic drugs: Secondary | ICD-10-CM | POA: Insufficient documentation

## 2016-03-19 DIAGNOSIS — I10 Essential (primary) hypertension: Secondary | ICD-10-CM | POA: Diagnosis not present

## 2016-03-19 DIAGNOSIS — R45851 Suicidal ideations: Secondary | ICD-10-CM | POA: Diagnosis present

## 2016-03-19 DIAGNOSIS — F411 Generalized anxiety disorder: Secondary | ICD-10-CM | POA: Insufficient documentation

## 2016-03-19 DIAGNOSIS — Z79899 Other long term (current) drug therapy: Secondary | ICD-10-CM | POA: Diagnosis not present

## 2016-03-19 DIAGNOSIS — Z833 Family history of diabetes mellitus: Secondary | ICD-10-CM | POA: Diagnosis not present

## 2016-03-19 DIAGNOSIS — R4183 Borderline intellectual functioning: Secondary | ICD-10-CM | POA: Diagnosis not present

## 2016-03-19 DIAGNOSIS — Z9889 Other specified postprocedural states: Secondary | ICD-10-CM | POA: Diagnosis not present

## 2016-03-19 LAB — COMPREHENSIVE METABOLIC PANEL
ALBUMIN: 4.7 g/dL (ref 3.5–5.0)
ALT: 48 U/L (ref 14–54)
AST: 55 U/L — AB (ref 15–41)
Alkaline Phosphatase: 48 U/L (ref 38–126)
Anion gap: 9 (ref 5–15)
BILIRUBIN TOTAL: 1 mg/dL (ref 0.3–1.2)
BUN: 13 mg/dL (ref 6–20)
CHLORIDE: 102 mmol/L (ref 101–111)
CO2: 28 mmol/L (ref 22–32)
CREATININE: 1 mg/dL (ref 0.44–1.00)
Calcium: 9.6 mg/dL (ref 8.9–10.3)
GFR calc Af Amer: >60 mL/min (ref >60)
GLUCOSE: 113 mg/dL — AB (ref 65–99)
POTASSIUM: 3.3 mmol/L — AB (ref 3.5–5.1)
Sodium: 139 mmol/L (ref 135–145)
TOTAL PROTEIN: 8.3 g/dL — AB (ref 6.5–8.1)

## 2016-03-19 LAB — CBC
HEMATOCRIT: 38.5 % (ref 36.0–46.0)
Hemoglobin: 12.7 g/dL (ref 12.0–15.0)
MCH: 26.8 pg (ref 26.0–34.0)
MCHC: 33 g/dL (ref 30.0–36.0)
MCV: 81.4 fL (ref 78.0–100.0)
PLATELETS: 265 10*3/uL (ref 150–400)
RBC: 4.73 MIL/uL (ref 3.87–5.11)
RDW: 14.4 % (ref 11.5–15.5)
WBC: 8.5 10*3/uL (ref 4.0–10.5)

## 2016-03-19 LAB — RAPID URINE DRUG SCREEN, HOSP PERFORMED
AMPHETAMINES: NOT DETECTED
BARBITURATES: NOT DETECTED
BENZODIAZEPINES: NOT DETECTED
Cocaine: NOT DETECTED
Opiates: NOT DETECTED
Tetrahydrocannabinol: NOT DETECTED

## 2016-03-19 LAB — ETHANOL

## 2016-03-19 LAB — ACETAMINOPHEN LEVEL: Acetaminophen (Tylenol), Serum: <10 ug/mL — ABNORMAL LOW (ref 10–30)

## 2016-03-19 LAB — SALICYLATE LEVEL: Salicylate Lvl: <7 mg/dL (ref 2.8–30.0)

## 2016-03-19 LAB — POC URINE PREG, ED: PREG TEST UR: NEGATIVE

## 2016-03-19 MED ORDER — ALUM & MAG HYDROXIDE-SIMETH 200-200-20 MG/5ML PO SUSP: 30.0000 mL | ORAL | Status: DC | PRN

## 2016-03-19 MED ORDER — ZOLPIDEM TARTRATE 5 MG PO TABS: 5.0000 mg | ORAL_TABLET | Freq: Every evening | ORAL | Status: DC | PRN

## 2016-03-19 MED ORDER — IBUPROFEN 200 MG PO TABS: 600.0000 mg | ORAL_TABLET | Freq: Three times a day (TID) | ORAL | Status: DC | PRN

## 2016-03-19 MED ORDER — ONDANSETRON HCL 4 MG PO TABS: 4.0000 mg | ORAL_TABLET | Freq: Three times a day (TID) | ORAL | Status: DC | PRN

## 2016-03-19 MED ORDER — GLIPIZIDE ER 10 MG PO TB24
10.0000 mg | ORAL_TABLET | Freq: Every day | ORAL | Status: DC
Start: 2016-03-20 — End: 2016-03-20
  Administered 2016-03-20: 10 mg via ORAL
  Filled 2016-03-19: qty 1

## 2016-03-19 MED ORDER — LORAZEPAM 1 MG PO TABS: 1.0000 mg | ORAL_TABLET | Freq: Three times a day (TID) | ORAL | Status: DC | PRN

## 2016-03-19 MED ORDER — HYDROXYZINE HCL 25 MG PO TABS: 25.0000 mg | ORAL_TABLET | Freq: Three times a day (TID) | ORAL | Status: DC | PRN

## 2016-03-19 MED ORDER — METFORMIN HCL 500 MG PO TABS
500.0000 mg | ORAL_TABLET | Freq: Two times a day (BID) | ORAL | Status: DC
  Administered 2016-03-19 – 2016-03-20 (×2): 500 mg via ORAL
  Filled 2016-03-19 (×2): qty 1

## 2016-03-19 MED ORDER — ACETAMINOPHEN 325 MG PO TABS: 650.0000 mg | ORAL_TABLET | ORAL | Status: DC | PRN

## 2016-03-19 MED ORDER — BUSPIRONE HCL 10 MG PO TABS
15.0000 mg | ORAL_TABLET | Freq: Every evening | ORAL | Status: DC
  Administered 2016-03-19: 15 mg via ORAL
  Filled 2016-03-19: qty 2

## 2016-03-19 MED ORDER — HYDROCHLOROTHIAZIDE 25 MG PO TABS
25.0000 mg | ORAL_TABLET | Freq: Every day | ORAL | Status: DC
  Administered 2016-03-20: 25 mg via ORAL
  Filled 2016-03-19: qty 1

## 2016-03-19 MED ORDER — FLUVOXAMINE MALEATE 100 MG PO TABS
100.0000 mg | ORAL_TABLET | Freq: Every day | ORAL | Status: DC
  Filled 2016-03-19 (×2): qty 1

## 2016-03-19 MED ORDER — DULOXETINE HCL 30 MG PO CPEP
60.0000 mg | ORAL_CAPSULE | Freq: Every day | ORAL | Status: DC
  Filled 2016-03-19: qty 2

## 2016-03-19 NOTE — ED Provider Notes (Signed)
[facility] DEPT Provider Note   CSN: [id number] Arrival date & time: 03/19/16  1511     History   Chief Complaint Chief Complaint  Patient presents with  . Suicidal    HPI [name] is a 26 y.o. female.  [name] is a 25 y.o. female with h/o anxiety, bipolar 1 disorder, cognitive deficits, depression, T2DM, HTN, obesity, HLD presents to ED with complaint of suicidal ideation. Pt reports she is feeling increasingly depressed and has had suicidal ideations today. She has a plan - overdosing on her medications. She reports increased life stressors and states her "crush" is "upset with her." She has felt more anxious and has been cutting her hair related to her anxiety, helps her nerves. She denies HI, self harm, or V/A hallucinations. She is being treated for her anxiety and depression, followed by [facility], and states she is taking her medication as prescribed. She reports being hospitalized multiple times for her depression. She complains of abdominal pain; however, this is chronic in nature and unchanged. She denies fever, N/V, dysuria, hematuria, vaginal discharge, vaginal pain, or pelvic pain.       Past Medical History:  Diagnosis Date  . Anxiety   . Bipolar 1 disorder ([place])   . Cognitive deficits   . Depression   . Diabetes mellitus without complication ([place])   . Hypertension   . Mental disorder   . Obesity     Patient Active Problem List   Diagnosis Date Noted  . 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 Learning disability 07/18/2015  . 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 Diabetes mellitus ([place]) 07/18/2015  . 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 Hyperlipidemia 07/18/2015  . Drug overdose   . Cognitive deficits 10/12/2012  . Generalized anxiety disorder 06/28/2012    Past Surgical History:  Procedure Laterality Date  . CESAREAN SECTION    . CESAREAN SECTION N/A  04/25/2013   Procedure: REPEAT CESAREAN SECTION;  Surgeon: [name], MD;  Location: [facility];  Service: Obstetrics;  Laterality: N/A;  . MASS EXCISION N/A 06/03/2012   Procedure: EXCISION MASS;  Surgeon: [name], MD;  Location: [facility];  Service: ENT;  Laterality: N/A;  Excision uvula mass  . TONSILLECTOMY N/A 06/03/2012   Procedure: TONSILLECTOMY;  Surgeon: [name], MD;  Location: [facility];  Service: ENT;  Laterality: N/A;  . TONSILLECTOMY      OB History    Gravida Para Term Preterm AB Living   3 3 3  0 0 3   SAB TAB Ectopic Multiple Live Births   0 0 0 0 3       Home Medications    Prior to Admission medications   Medication Sig Start Date End Date Taking? Authorizing Provider  busPIRone (BUSPAR) 15 MG tablet Take 15 mg by mouth every evening.    Yes Historical Provider, MD  DULoxetine (CYMBALTA) 60 MG capsule Take 60 mg by mouth at bedtime.    Yes Historical Provider, MD  fluvoxaMINE (LUVOX) 100 MG tablet Take 100 mg by mouth at bedtime.    Yes Historical Provider, MD  glipiZIDE (GLUCOTROL XL) 10 MG 24 hr tablet Take 10 mg by mouth daily with breakfast.   Yes Historical Provider, MD  hydrochlorothiazide (HYDRODIURIL) 25 MG tablet Take 25 mg by mouth daily.   Yes Historical Provider, MD  hydrOXYzine (VISTARIL) 25 MG capsule Take 25 mg by mouth 3 (three) times daily  as needed for anxiety.   Yes Historical Provider, MD  metFORMIN (GLUCOPHAGE) 500 MG tablet Take 500 mg by mouth 2 (two) times daily with a meal.   Yes Historical Provider, MD  paliperidone (INVEGA SUSTENNA) 234 MG/1.5ML SUSP injection Inject 234 mg into the muscle every 30 (thirty) days.   Yes Historical Provider, MD  promethazine (PHENERGAN) 25 MG tablet Take 1 tablet (25 mg total) by mouth every 8 (eight) hours as needed for nausea or vomiting. 03/11/16  Yes Quintella Reichert, MD  methocarbamol (ROBAXIN) 500 MG tablet Take 1 tablet (500 mg total) by mouth 2 (two) times  daily. Patient not taking: Reported on 03/19/2016 01/18/16   Janne Napoleon, NP  ranitidine (ZANTAC) 150 MG capsule Take 1 capsule (150 mg total) by mouth daily. Patient not taking: Reported on 03/19/2016 01/04/16   Waynetta Pean, PA-C    Family History Family History  Problem Relation Age of Onset  . Hypertension Mother   . Diabetes Father     Social History Social History  Substance Use Topics  . Smoking status: Former Smoker    Packs/day: 1.00    Years: 7.00    Types: Cigarettes    Quit date: 06/30/2015  . Smokeless tobacco: Never Used  . Alcohol use No     Allergies   Omnipaque [iohexol] and Penicillins   Review of Systems Review of Systems  Constitutional: Negative for fever.  HENT: Negative for trouble swallowing.   Eyes: Negative for visual disturbance.  Respiratory: Negative for shortness of breath.   Cardiovascular: Negative for chest pain.  Gastrointestinal: Positive for abdominal pain. Negative for nausea and vomiting.  Genitourinary: Negative for dysuria, hematuria, pelvic pain, vaginal bleeding, vaginal discharge and vaginal pain.  Musculoskeletal: Negative for arthralgias and myalgias.  Neurological: Negative for syncope.  Psychiatric/Behavioral: Positive for sleep disturbance and suicidal ideas. Negative for hallucinations and self-injury. The patient is nervous/anxious.      Physical Exam Updated Vital Signs BP 134/90 (BP Location: Right Arm)   Pulse 106   Temp 97.8 F (36.6 C) (Oral)   Resp 14   SpO2 100%   Physical Exam  Constitutional: She appears well-developed and well-nourished. No distress.  HENT:  Head: Normocephalic and atraumatic.  Mouth/Throat: Oropharynx is clear and moist. No oropharyngeal exudate.  Eyes: Conjunctivae and EOM are normal. Pupils are equal, round, and reactive to light. Right eye exhibits no discharge. Left eye exhibits no discharge. No scleral icterus.  Neck: Normal range of motion and phonation normal. Neck supple. No  neck rigidity. Normal range of motion present.  Cardiovascular: Normal rate, regular rhythm, normal heart sounds and intact distal pulses.   No murmur heard. Pulmonary/Chest: Effort normal and breath sounds normal. No stridor. No respiratory distress. She has no wheezes. She has no rales.  Abdominal: Soft. Bowel sounds are normal. She exhibits no distension. There is tenderness. There is no rigidity, no rebound, no guarding and no CVA tenderness.  Mild lower abdominal tenderness without rebound, rigidity, or guarding.   Musculoskeletal: Normal range of motion.  Lymphadenopathy:    She has no cervical adenopathy.  Neurological: She is alert. She is not disoriented. Coordination and gait normal. GCS eye subscore is 4. GCS verbal subscore is 5. GCS motor subscore is 6.  Skin: Skin is warm and dry. She is not diaphoretic.  Psychiatric: She has a normal mood and affect. Her behavior is normal.     ED Treatments / Results  Labs (all labs ordered are listed, but only abnormal  results are displayed) Labs Reviewed  COMPREHENSIVE METABOLIC PANEL - Abnormal; Notable for the following:       Result Value   Potassium 3.3 (*)    Glucose, Bld 113 (*)    Total Protein 8.3 (*)    AST 55 (*)    All other components within normal limits  ACETAMINOPHEN LEVEL - Abnormal; Notable for the following:    Acetaminophen (Tylenol), Serum <10 (*)    All other components within normal limits  ETHANOL  SALICYLATE LEVEL  CBC  RAPID URINE DRUG SCREEN, HOSP PERFORMED  POC URINE PREG, ED    EKG  EKG Interpretation None       Radiology No results found.  Procedures Procedures (including critical care time)  Medications Ordered in ED Medications  alum & mag hydroxide-simeth (MAALOX/MYLANTA) 200-200-20 MG/5ML suspension 30 mL (not administered)  ondansetron (ZOFRAN) tablet 4 mg (not administered)  zolpidem (AMBIEN) tablet 5 mg (not administered)  ibuprofen (ADVIL,MOTRIN) tablet 600 mg (not  administered)  acetaminophen (TYLENOL) tablet 650 mg (not administered)  busPIRone (BUSPAR) tablet 15 mg (not administered)  DULoxetine (CYMBALTA) DR capsule 60 mg (not administered)  fluvoxaMINE (LUVOX) tablet 100 mg (not administered)  glipiZIDE (GLUCOTROL XL) 24 hr tablet 10 mg (not administered)  hydrochlorothiazide (HYDRODIURIL) tablet 25 mg (not administered)  metFORMIN (GLUCOPHAGE) tablet 500 mg (not administered)  hydrOXYzine (ATARAX/VISTARIL) tablet 25 mg (not administered)     Initial Impression / Assessment and Plan / ED Course  I have reviewed the triage vital signs and the nursing notes.  Pertinent labs & imaging results that were available during my care of the patient were reviewed by me and considered in my medical decision making (see chart for details).  Clinical Course     Patient presents to ED with suicidal ideation, she has a plan. Patient is afebrile and non-toxic appearing in NAD. Initial vital signs remarkable for slight tachycardia, on my exam, pt not tachycardic. Heart RRR. Lungs CTABL. Abdomen +BS, soft, non-distended with mild tenderness in lower abdomen. This is chronic in nature and unchanged. Denies dysuria, hematuria, vaginal bleeding, vaginal discharge, pelvic pain, or vaginal pain. Labs grossly nml - slight elevation in AST; however, stable compared to previous. Patient is medically cleared. Dispo pending TTS consult.   Final Clinical Impressions(s) / ED Diagnoses   Final diagnoses:  Suicidal ideation    New Prescriptions New Prescriptions   No medications on file     Roxanna Mew, PA-C 03/19/16 [place], MD 03/20/16 1900

## 2016-03-19 NOTE — ED Notes (Signed)
Pt admitted to room #40. Pt behavior calm and cooperative. Pt reports that "I have a crush on a guy that is not responding back." Pt also reports she has been cutting her hair because of bad nerves, but that she wants long hair. Pt endorsing SI with plan to OD. Pt denies HI. Denies AVH. Pt reports suicide attempt a couple of years ago by OD. Pt reports she is taking medication as prescribed; medications including Buspar, Cymbalta, and luvox. Special checks q 15 mins in place for safety. Video monitoring in place.

## 2016-03-19 NOTE — ED Notes (Signed)
One bag of belongings placed in locker 27

## 2016-03-19 NOTE — ED Notes (Signed)
SBAR Report received from previous nurse. Pt received calm and in room. Pt denies current SI/ HI, A/V H, depression, and pain at this time, and is otherwise stable. Pt reminded of camera surveillance, q 15 min rounds, and rules of the milieu. Will continue to assess.

## 2016-03-19 NOTE — BH Assessment (Signed)
Tele Assessment Note   [name] is an African-American 25 y.o. female who presented to [facility] via police with complaint of suicidal ideation.  She is, as of this writing, voluntary.  Pt reported as follows:  She stated that she has had conflict with her "crush" (listed in other assessments as her ex-boyfriend) and as a result, she feels suicidal.  Pt endorsed plan to overdose.  In addition to suicidal ideation, Pt endorsed isolation, fatigue, despondency, loss of interest, and tearfulness.  Pt stated that she has felt this way for a few days.  Pt denied current auditory/visual hallucination, homicidal ideation, or current self-injury.  She reported a history of cutting behavior (none this year).  Pt reported multiple hospitalizations for suicidal ideation with similar plan to overdose.  Pt was last assessed with [facility] on February 02, 2016.  At that time, she reported suicidal ideation and worsening depressive symptoms.  She was not placed; instead, she was discharged to her current provider -- [facility].  Pt stated that she had her last Invega shot about two weeks ago.  During assessment, Pt presented as alert and oriented.  She was dressed in scrubs and appeared appropriately groomed.  Pt had good eye contact, and demeanor was calm.  Pt endorsed suicidal ideation with plan to overdose (no attempt) and other depressive symptoms (see above).  Pt denied AVH, HI, self-injury, or substance use.  Speech was soft but otherwise normal in rate and rhythm.  Thought processes were within normal range; thought content was goal-oriented.  There was no evidence of delusion.  Pt's memory and concentration were grossly intact.  Insight, judgment, and impulse control was deemed fair.  Consulted with Starleen Arms, NP.  Given Pt's condition and that she presents with the same condition as last assessment, NP recommended that Pt be evaluated in AM.  Diagnosis: Bipolar I D/O, depressed  Past Medical  History:  Past Medical History:  Diagnosis Date  . Anxiety   . Bipolar 1 disorder ([place])   . Cognitive deficits   . Depression   . Diabetes mellitus without complication ([place])   . Hypertension   . Mental disorder   . Obesity     Past Surgical History:  Procedure Laterality Date  . CESAREAN SECTION    . CESAREAN SECTION N/A 04/25/2013   Procedure: REPEAT CESAREAN SECTION;  Surgeon: [name], MD;  Location: [facility];  Service: Obstetrics;  Laterality: N/A;  . MASS EXCISION N/A 06/03/2012   Procedure: EXCISION MASS;  Surgeon: [name], MD;  Location: [facility];  Service: ENT;  Laterality: N/A;  Excision uvula mass  . TONSILLECTOMY N/A 06/03/2012   Procedure: TONSILLECTOMY;  Surgeon: [name], MD;  Location: [facility];  Service: ENT;  Laterality: N/A;  . TONSILLECTOMY      Family History:  Family History  Problem Relation Age of Onset  . Hypertension Mother   . Diabetes Father     Social History:  reports that she quit smoking about 8 months ago. Her smoking use included Cigarettes. She has a 7.00 pack-year smoking history. She has never used smokeless tobacco. She reports that she does not drink alcohol or use drugs.  Additional Social History:  Alcohol / Drug Use Pain Medications: See PTA Prescriptions: See PTA Over the Counter: See PTA History of alcohol / drug use?: No history of alcohol / drug abuse (Use of cigarettes)  CIWA: CIWA-Ar BP: 134/90 Pulse Rate: 106 COWS:    PATIENT  STRENGTHS: (choose at least two) Average or above average intelligence Communication skills  Allergies:  Allergies  Allergen Reactions  . Omnipaque [Iohexol] Swelling and Other (See Comments)    Reaction:  Eye swelling  . Penicillins Hives and Other (See Comments)    Has patient had a PCN reaction causing immediate rash, facial/tongue/throat swelling, SOB or lightheadedness with hypotension: No Has patient had a PCN reaction causing severe  rash involving mucus membranes or skin necrosis: No Has patient had a PCN reaction that required hospitalization No Has patient had a PCN reaction occurring within the last 10 years: No If all of the above answers are "NO", then may proceed with Cephalosporin use.    Home Medications:  (Not in a hospital admission)  OB/GYN Status:  No LMP recorded. Patient has had an implant.  General Assessment Data Location of Assessment: [facility] ED TTS Assessment: In system Is this a Tele or Face-to-Face Assessment?: Tele Assessment Is this an Initial Assessment or a Re-assessment for this encounter?: Initial Assessment Marital status: Married Is patient pregnant?: No Pregnancy Status: No Living Arrangements: Parent (Mother) Can pt return to current living arrangement?: Yes Admission Status: Voluntary Is patient capable of signing voluntary admission?: Yes Referral Source: Self/Family/Friend Insurance type: [place] M[employer]     Crisis Care Plan Living Arrangements: Parent (Mother) Name of Psychiatrist: Dr. Rosine Door at [facility] Name of Therapist: [facility].  Education Status Is patient currently in school?: No Highest grade of school patient has completed: 11th grade  Risk to self with the past 6 months Suicidal Ideation: Yes-Currently Present Has patient been a risk to self within the past 6 months prior to admission? : Yes Suicidal Intent: No Has patient had any suicidal intent within the past 6 months prior to admission? : No Is patient at risk for suicide?: Yes Suicidal Plan?: Yes-Currently Present Has patient had any suicidal plan within the past 6 months prior to admission? : No Specify Current Suicidal Plan: Overdose Access to Means: No What has been your use of drugs/alcohol within the last 12 months?: None Previous Attempts/Gestures: Yes Other Self Harm Risks: 2 (At least 2) Triggers for Past Attempts: Other personal contacts (Contact with  'crush' -- ex-boyfriend) Intentional Self Injurious Behavior: Cutting Comment - Self Injurious Behavior: Hx of cutting; last instance was several years ago Family Suicide History: No Recent stressful life event(s): Conflict (Comment) Persecutory voices/beliefs?: No (Not currently, but has history of voices) Depression: Yes Depression Symptoms: Despondent, Tearfulness, Loss of interest in usual pleasures Substance abuse history and/or treatment for substance abuse?: No Suicide prevention information given to non-admitted patients: Not applicable  Risk to Others within the past 6 months Homicidal Ideation: No Does patient have any lifetime risk of violence toward others beyond the six months prior to admission? : No Thoughts of Harm to Others: No Current Homicidal Intent: No Current Homicidal Plan: No Access to Homicidal Means: No History of harm to others?: No Assessment of Violence: None Noted Does patient have access to weapons?: No Criminal Charges Pending?: No Does patient have a court date: No Is patient on probation?: No  Psychosis Hallucinations: None noted Delusions: None noted  Mental Status Report Appearance/Hygiene: Unremarkable, In scrubs Eye Contact: Good Motor Activity: Unremarkable, Freedom of movement Speech: Logical/coherent, Soft Level of Consciousness: Alert Mood: Sad Affect: Sad, Blunted Anxiety Level: None Thought Processes: Coherent, Relevant Judgement: Partial Orientation: Person, Place, Time, Situation Obsessive Compulsive Thoughts/Behaviors: None  Cognitive Functioning Concentration: Good Memory: Remote Intact, Recent Intact IQ:  Average Insight: Fair Impulse Control: Fair Appetite: Good Sleep: No Change Total Hours of Sleep: 8 Vegetative Symptoms: None  ADLScreening [facility] Assessment Services) Patient's cognitive ability adequate to safely complete daily activities?: Yes Patient able to express need for assistance with ADLs?:  Yes Independently performs ADLs?: Yes (appropriate for developmental age)  Prior Inpatient Therapy Prior Inpatient Therapy: Yes Prior Therapy Dates: 03/17, 01/17, 01/2015 Prior Therapy Facilty/Provider(s): [facility] Reason for Treatment: SI  Prior Outpatient Therapy Prior Outpatient Therapy: Yes Prior Therapy Dates: Ongoing Prior Therapy Facilty/Provider(s): [facility] Reason for Treatment: med management Does patient have an ACCT team?: No Does patient have Intensive In-House Services?  : No Does patient have [facility] services? : No Does patient have P4CC services?: No  ADL Screening (condition at time of admission) Patient's cognitive ability adequate to safely complete daily activities?: Yes Is the patient deaf or have difficulty hearing?: No Does the patient have difficulty seeing, even when wearing glasses/contacts?: No Does the patient have difficulty concentrating, remembering, or making decisions?: No Patient able to express need for assistance with ADLs?: Yes Does the patient have difficulty dressing or bathing?: No Independently performs ADLs?: Yes (appropriate for developmental age) Does the patient have difficulty walking or climbing stairs?: No Weakness of Legs: None Weakness of Arms/Hands: None  Home Assistive Devices/Equipment Home Assistive Devices/Equipment: None  Therapy Consults (therapy consults require a physician order) PT Evaluation Needed: No OT Evalulation Needed: No SLP Evaluation Needed: No Abuse/Neglect Assessment (Assessment to be complete while patient is alone) Physical Abuse: Denies Verbal Abuse: Denies Sexual Abuse: Denies Exploitation of patient/patient's resources: Denies Self-Neglect: Denies Values / Beliefs Cultural Requests During Hospitalization: None Spiritual Requests During Hospitalization: None Consults Spiritual Care Consult Needed: No Social Work Consult Needed: No Regulatory affairs officer (For Healthcare) Does  Patient Have a Medical Advance Directive?: No    Additional Information 1:1 In Past 12 Months?: No CIRT Risk: No Elopement Risk: No Does patient have medical clearance?: Yes     Disposition:  Disposition Initial Assessment Completed for this Encounter: Yes Disposition of Patient: Other dispositions Other disposition(s): Other (Comment) (Per L. Romilda Garret, NP, Pt needs AM psych eval)  Laurena Slimmer Lile Mccurley 03/19/2016 6:41 PM

## 2016-03-19 NOTE — B[facility] Counselor (Addendum)
Entered in error

## 2016-03-19 NOTE — ED Notes (Signed)
3 bags containing pt belongings placed in locker 27.

## 2016-03-19 NOTE — ED Triage Notes (Signed)
Pt reports being under a lot of stress regarding relationships in her life. Pt states "I have bad nerves and I need medication for my depression and anxiety because I want to stop cutting my hair". Pt reports feeling depressed and her plan for suicide attempt is to take all her medication to overdose. Pt stated "I am tired of living". Pt reports that her children are in adoptive care and she is missing them for thanksgiving.

## 2016-03-20 DIAGNOSIS — Z888 Allergy status to other drugs, medicaments and biological substances status: Secondary | ICD-10-CM

## 2016-03-20 DIAGNOSIS — R4183 Borderline intellectual functioning: Secondary | ICD-10-CM

## 2016-03-20 DIAGNOSIS — Z79899 Other long term (current) drug therapy: Secondary | ICD-10-CM

## 2016-03-20 DIAGNOSIS — Z87891 Personal history of nicotine dependence: Secondary | ICD-10-CM

## 2016-03-20 DIAGNOSIS — F411 Generalized anxiety disorder: Secondary | ICD-10-CM

## 2016-03-20 DIAGNOSIS — Z9889 Other specified postprocedural states: Secondary | ICD-10-CM | POA: Diagnosis not present

## 2016-03-20 DIAGNOSIS — Z8249 Family history of ischemic heart disease and other diseases of the circulatory system: Secondary | ICD-10-CM

## 2016-03-20 DIAGNOSIS — Z833 Family history of diabetes mellitus: Secondary | ICD-10-CM

## 2016-03-20 DIAGNOSIS — Z880 Allergy status to penicillin: Secondary | ICD-10-CM

## 2016-03-20 NOTE — Consult Note (Signed)
[facility] Consult   Reason for Consult:  Anxiety with suicidal ideations Referring Physician:  EDP Patient Identification: [name] MRN:  [id number] Principal Diagnosis: Generalized anxiety disorder Diagnosis:   Patient Active Problem List   Diagnosis Date Noted  . Generalized anxiety disorder [F41.1] 06/28/2012    Priority: High  . 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 Learning disability [F81.9] 07/18/2015  . 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 Diabetes mellitus ([place]) [E11.9] 07/18/2015  . MDD (major depressive disorder), recurrent, severe, with psychosis ([place]) [F33.3] 07/18/2015  . Hyperlipidemia [E78.5] 07/18/2015  . Drug overdose [T50.901A]   . Cognitive deficits [R41.89] 10/12/2012    Total Time spent with patient: 45 minutes  Subjective:   [name] is a 25 y.o. female patient does not warrant admission.  HPI:  25 yo female who presented to the ED with an increase in anxiety and suicidal ideations.  She reports she stopped taking her Luvox a couple of days ago because she thought it was making her sleepy.  Her anxiety increased but she feels better that it was restarted.  Evidently, she was started on an antiemetic which is most likely causing her nausea.  Denies suicidal/homicidal ideations, hallucinations, and alcohol/drug abuse.  Stable for discharge.  She lives with her mother and has outpatient mental health care in place.  [name] requests to leave and feels she is back to her baseling.  Past Psychiatric History: depression, anxiety  Risk to Self: Suicidal Ideation: Yes-Currently Present Suicidal Intent: No Is patient at risk for suicide?: Yes Suicidal Plan?: Yes-Currently Present Specify Current Suicidal Plan: Overdose Access to Means: No What has been your use of drugs/alcohol within the last 12 months?: None Other Self Harm Risks: 2 (At least 2) Triggers for Past Attempts: Other personal contacts  (Contact with 'crush' -- ex-boyfriend) Intentional Self Injurious Behavior: Cutting Comment - Self Injurious Behavior: Hx of cutting; last instance was several years ago Risk to Others: Homicidal Ideation: No Thoughts of Harm to Others: No Current Homicidal Intent: No Current Homicidal Plan: No Access to Homicidal Means: No History of harm to others?: No Assessment of Violence: None Noted Does patient have access to weapons?: No Criminal Charges Pending?: No Does patient have a court date: No Prior Inpatient Therapy: Prior Inpatient Therapy: Yes Prior Therapy Dates: 03/17, 01/17, 01/2015 Prior Therapy Facilty/Provider(s): [facility] Reason for Treatment: SI Prior Outpatient Therapy: Prior Outpatient Therapy: Yes Prior Therapy Dates: Ongoing Prior Therapy Facilty/Provider(s): [facility] Reason for Treatment: med management Does patient have an ACCT team?: No Does patient have Intensive In-House Services?  : No Does patient have [facility] services? : No Does patient have P4CC services?: No  Past Medical History:  Past Medical History:  Diagnosis Date  . Anxiety   . Bipolar 1 disorder ([place])   . Cognitive deficits   . Depression   . Diabetes mellitus without complication ([place])   . Hypertension   . Mental disorder   . Obesity     Past Surgical History:  Procedure Laterality Date  . CESAREAN SECTION    . CESAREAN SECTION N/A 04/25/2013   Procedure: REPEAT CESAREAN SECTION;  Surgeon: [name], MD;  Location: [facility];  Service: Obstetrics;  Laterality: N/A;  . MASS EXCISION N/A 06/03/2012   Procedure: EXCISION MASS;  Surgeon: [name], MD;  Location: [facility];  Service: ENT;  Laterality: N/A;  Excision uvula mass  . TONSILLECTOMY N/A 06/03/2012   Procedure: TONSILLECTOMY;  Surgeon: Shanon Brow  Wilburn Cornelia, MD;  Location: [facility];  Service: ENT;  Laterality: N/A;  . TONSILLECTOMY     Family History:  Family History  Problem  Relation Age of Onset  . Hypertension Mother   . Diabetes Father    Family Psychiatric  History: none Social History:  History  Alcohol Use No     History  Drug Use No    Comment: Patient denies    Social History   Social History  . Marital status: Single    Spouse name: N/A  . Number of children: N/A  . Years of education: N/A   Social History Main Topics  . Smoking status: Former Smoker    Packs/day: 1.00    Years: 7.00    Types: Cigarettes    Quit date: 06/30/2015  . Smokeless tobacco: Never Used  . Alcohol use No  . Drug use: No     Comment: Patient denies  . Sexual activity: No   Other Topics Concern  . Not on file   Social History Narrative  . No narrative on file   Additional Social History:    Allergies:   Allergies  Allergen Reactions  . Omnipaque [Iohexol] Swelling and Other (See Comments)    Reaction:  Eye swelling  . Penicillins Hives and Other (See Comments)    Has patient had a PCN reaction causing immediate rash, facial/tongue/throat swelling, SOB or lightheadedness with hypotension: No Has patient had a PCN reaction causing severe rash involving mucus membranes or skin necrosis: No Has patient had a PCN reaction that required hospitalization No Has patient had a PCN reaction occurring within the last 10 years: No If all of the above answers are "NO", then may proceed with Cephalosporin use.    Labs:  Results for orders placed or performed during the hospital encounter of 03/19/16 (from the past 48 hour(s))  Rapid urine drug screen (hospital performed)     Status: None   Collection Time: 03/19/16  3:29 PM  Result Value Ref Range   Opiates NONE DETECTED NONE DETECTED   Cocaine NONE DETECTED NONE DETECTED   Benzodiazepines NONE DETECTED NONE DETECTED   Amphetamines NONE DETECTED NONE DETECTED   Tetrahydrocannabinol NONE DETECTED NONE DETECTED   Barbiturates NONE DETECTED NONE DETECTED    Comment:        DRUG SCREEN FOR MEDICAL  PURPOSES ONLY.  IF CONFIRMATION IS NEEDED FOR ANY PURPOSE, NOTIFY LAB WITHIN 5 DAYS.        LOWEST DETECTABLE LIMITS FOR URINE DRUG SCREEN Drug Class       Cutoff (ng/mL) Amphetamine      1000 Barbiturate      200 Benzodiazepine   726 Tricyclics       203 Opiates          300 Cocaine          300 THC              50   Comprehensive metabolic panel     Status: Abnormal   Collection Time: 03/19/16  3:30 PM  Result Value Ref Range   Sodium 139 135 - 145 mmol/L   Potassium 3.3 (L) 3.5 - 5.1 mmol/L   Chloride 102 101 - 111 mmol/L   CO2 28 22 - 32 mmol/L   Glucose, Bld 113 (H) 65 - 99 mg/dL   BUN 13 6 - 20 mg/dL   Creatinine, Ser 1.00 0.44 - 1.00 mg/dL   Calcium 9.6 8.9 - 10.3 mg/dL  Total Protein 8.3 (H) 6.5 - 8.1 g/dL   Albumin 4.7 3.5 - 5.0 g/dL   AST 55 (H) 15 - 41 U/L   ALT 48 14 - 54 U/L   Alkaline Phosphatase 48 38 - 126 U/L   Total Bilirubin 1.0 0.3 - 1.2 mg/dL   GFR calc non Af Amer >60 >60 mL/min   GFR calc Af Amer >60 >60 mL/min    Comment: (NOTE) The eGFR has been calculated using the CKD EPI equation. This calculation has not been validated in all clinical situations. eGFR's persistently <60 mL/min signify possible Chronic Kidney Disease.    Anion gap 9 5 - 15  Ethanol     Status: None   Collection Time: 03/19/16  3:30 PM  Result Value Ref Range   Alcohol, Ethyl (B) <5 <5 mg/dL    Comment:        LOWEST DETECTABLE LIMIT FOR SERUM ALCOHOL IS 5 mg/dL FOR MEDICAL PURPOSES ONLY   Salicylate level     Status: None   Collection Time: 03/19/16  3:30 PM  Result Value Ref Range   Salicylate Lvl <1.6 2.8 - 30.0 mg/dL  Acetaminophen level     Status: Abnormal   Collection Time: 03/19/16  3:30 PM  Result Value Ref Range   Acetaminophen (Tylenol), Serum <10 (L) 10 - 30 ug/mL    Comment:        THERAPEUTIC CONCENTRATIONS VARY SIGNIFICANTLY. A RANGE OF 10-30 ug/mL MAY BE AN EFFECTIVE CONCENTRATION FOR MANY PATIENTS. HOWEVER, SOME ARE BEST TREATED AT  CONCENTRATIONS OUTSIDE THIS RANGE. ACETAMINOPHEN CONCENTRATIONS >150 ug/mL AT 4 HOURS AFTER INGESTION AND >50 ug/mL AT 12 HOURS AFTER INGESTION ARE OFTEN ASSOCIATED WITH TOXIC REACTIONS.   cbc     Status: None   Collection Time: 03/19/16  3:30 PM  Result Value Ref Range   WBC 8.5 4.0 - 10.5 K/uL   RBC 4.73 3.87 - 5.11 MIL/uL   Hemoglobin 12.7 12.0 - 15.0 g/dL   HCT 38.5 36.0 - 46.0 %   MCV 81.4 78.0 - 100.0 fL   MCH 26.8 26.0 - 34.0 pg   MCHC 33.0 30.0 - 36.0 g/dL   RDW 14.4 11.5 - 15.5 %   Platelets 265 150 - 400 K/uL  POC urine preg, ED     Status: None   Collection Time: 03/19/16  3:41 PM  Result Value Ref Range   Preg Test, Ur NEGATIVE NEGATIVE    Comment:        THE SENSITIVITY OF THIS METHODOLOGY IS >24 mIU/mL     Current Facility-Administered Medications  Medication Dose Route Frequency Provider Last Rate Last Dose  . acetaminophen (TYLENOL) tablet 650 mg  650 mg Oral Q4H PRN Roxanna Mew, PA-C      . alum & mag hydroxide-simeth (MAALOX/MYLANTA) 200-200-20 MG/5ML suspension 30 mL  30 mL Oral PRN Roxanna Mew, PA-C      . busPIRone (BUSPAR) tablet 15 mg  15 mg Oral QPM Roxanna Mew, PA-C   15 mg at 03/19/16 1805  . DULoxetine (CYMBALTA) DR capsule 60 mg  60 mg Oral QHS Roxanna Mew, PA-C      . fluvoxaMINE (LUVOX) tablet 100 mg  100 mg Oral QHS Roxanna Mew, PA-C      . glipiZIDE (GLUCOTROL XL) 24 hr tablet 10 mg  10 mg Oral Q breakfast Roxanna Mew, PA-C   10 mg at 03/20/16 1096  . hydrochlorothiazide (HYDRODIURIL) tablet 25 mg  25  mg Oral Daily Roxanna Mew, [place]   25 mg at 03/20/16 9407  . hydrOXYzine (ATARAX/VISTARIL) tablet 25 mg  25 mg Oral TID PRN Roxanna Mew, PA-C      . ibuprofen (ADVIL,MOTRIN) tablet 600 mg  600 mg Oral Q8H PRN Roxanna Mew, PA-C      . metFORMIN (GLUCOPHAGE) tablet 500 mg  500 mg Oral BID WC Roxanna Mew, PA-C   500 mg at 03/20/16 6808  . ondansetron (ZOFRAN) tablet 4 mg   4 mg Oral Q8H PRN Roxanna Mew, PA-C      . zolpidem [facility]) tablet 5 mg  5 mg Oral QHS PRN Roxanna Mew, PA-C       Current Outpatient Prescriptions  Medication Sig Dispense Refill  . busPIRone (BUSPAR) 15 MG tablet Take 15 mg by mouth every evening.     . DULoxetine (CYMBALTA) 60 MG capsule Take 60 mg by mouth at bedtime.   1  . fluvoxaMINE (LUVOX) 100 MG tablet Take 100 mg by mouth at bedtime.     Marland Kitchen glipiZIDE (GLUCOTROL XL) 10 MG 24 hr tablet Take 10 mg by mouth daily with breakfast.    . hydrochlorothiazide (HYDRODIURIL) 25 MG tablet Take 25 mg by mouth daily.    . hydrOXYzine (VISTARIL) 25 MG capsule Take 25 mg by mouth 3 (three) times daily as needed for anxiety.    . metFORMIN (GLUCOPHAGE) 500 MG tablet Take 500 mg by mouth 2 (two) times daily with a meal.    . paliperidone (INVEGA SUSTENNA) 234 MG/1.5ML SUSP injection Inject 234 mg into the muscle every 30 (thirty) days.    . promethazine (PHENERGAN) 25 MG tablet Take 1 tablet (25 mg total) by mouth every 8 (eight) hours as needed for nausea or vomiting. 10 tablet 0  . methocarbamol (ROBAXIN) 500 MG tablet Take 1 tablet (500 mg total) by mouth 2 (two) times daily. (Patient not taking: Reported on 03/19/2016) 20 tablet 0  . ranitidine (ZANTAC) 150 MG capsule Take 1 capsule (150 mg total) by mouth daily. (Patient not taking: Reported on 03/19/2016) 30 capsule 0    Musculoskeletal: Strength & Muscle Tone: within normal limits Gait & Station: normal Patient leans: N/A  Psychiatric Specialty Exam: Physical Exam  Constitutional: She is oriented to person, place, and time. She appears well-developed and well-nourished.  HENT:  Head: Normocephalic.  Neck: Normal range of motion.  Respiratory: Effort normal.  Musculoskeletal: Normal range of motion.  Neurological: She is alert and oriented to person, place, and time.  Psychiatric: She has a normal mood and affect. Her speech is normal and behavior is normal. Judgment  and thought content normal. Cognition and memory are normal.    Review of Systems  Constitutional: Negative.   HENT: Negative.   Eyes: Negative.   Respiratory: Negative.   Cardiovascular: Negative.   Gastrointestinal: Negative.   Genitourinary: Negative.   Musculoskeletal: Negative.   Skin: Negative.   Neurological: Negative.   Endo/Heme/Allergies: Negative.   Psychiatric/Behavioral: Negative.     Blood pressure 119/71, pulse 84, temperature 98.2 F (36.8 C), temperature source Oral, resp. rate 16, SpO2 91 %.There is no height or weight on file to calculate BMI.  General Appearance: Casual  Eye Contact:  Good  Speech:  Normal Rate  Volume:  Normal  Mood:  Euthymic  Affect:  Congruent  Thought Process:  Coherent and Descriptions of Associations: Intact  Orientation:  Full (Time, Place, and Person)  Thought Content:  WDL  Suicidal Thoughts:  No  Homicidal Thoughts:  No  Memory:  Immediate;   Good Recent;   Good Remote;   Good  Judgement:  Fair  Insight:  Fair  Psychomotor Activity:  Normal  Concentration:  Concentration: Good and Attention Span: Good  Recall:  [place] of Knowledge:  Fair  Language:  Good  Akathisia:  No  Handed:  Right  AIMS (if indicated):     Assets:  Housing Leisure Time Physical Health Resilience Social Support  ADL's:  Intact  Cognition:  WNL  Sleep:        Treatment Plan Summary: Daily contact with patient to assess and evaluate symptoms and progress in treatment, Medication management and Plan general anxiety disorder:  -Crisis stabilization -Medication management:  Continue Buspar 15 mg daily for anxiety, Cymbalta 60 mg daily for depression, Luvox 100 mg daily for anxiety and depression, and Vistaril 25 mg TID PRN anxiety. -Individual counseling  Disposition: No evidence of imminent risk to self or others at present.    Waylan Boga, NP 03/20/2016 11:34 AM   Patient seen, chart reviewed, case discussed with treatment team and  physician extender and formulated treatment plan. Reviewed the information documented and agree with the treatment plan.  Biagio Snelson [facility] 03/21/2016 9:49 AM

## 2016-03-20 NOTE — BHH Suicide Risk Assessment (Signed)
Suicide Risk Assessment  Discharge Assessment   [facility] Discharge Suicide Risk Assessment   Principal Problem: Generalized anxiety disorder Discharge Diagnoses:  Patient Active Problem List   Diagnosis Date Noted  . Generalized anxiety disorder [F41.1] 06/28/2012    Priority: High  . 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 Learning disability [F81.9] 07/18/2015  . 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 Diabetes mellitus ([place]) [E11.9] 07/18/2015  . MDD (major depressive disorder), recurrent, severe, with psychosis ([place]) [F33.3] 07/18/2015  . Hyperlipidemia [E78.5] 07/18/2015  . Drug overdose [T50.901A]   . Cognitive deficits [R41.89] 10/12/2012    Total Time spent with patient: 45 minutes   Musculoskeletal: Strength & Muscle Tone: within normal limits Gait & Station: normal Patient leans: N/A  Psychiatric Specialty Exam: Physical Exam  Constitutional: She is oriented to person, place, and time. She appears well-developed and well-nourished.  HENT:  Head: Normocephalic.  Neck: Normal range of motion.  Respiratory: Effort normal.  Musculoskeletal: Normal range of motion.  Neurological: She is alert and oriented to person, place, and time.  Psychiatric: She has a normal mood and affect. Her speech is normal and behavior is normal. Judgment and thought content normal. Cognition and memory are normal.    Review of Systems  Constitutional: Negative.   HENT: Negative.   Eyes: Negative.   Respiratory: Negative.   Cardiovascular: Negative.   Gastrointestinal: Negative.   Genitourinary: Negative.   Musculoskeletal: Negative.   Skin: Negative.   Neurological: Negative.   Endo/Heme/Allergies: Negative.   Psychiatric/Behavioral: Negative.     Blood pressure 119/71, pulse 84, temperature 98.2 F (36.8 C), temperature source Oral, resp. rate 16, SpO2 91 %.There is no height or weight on file to calculate BMI.  General Appearance: Casual  Eye Contact:   Good  Speech:  Normal Rate  Volume:  Normal  Mood:  Euthymic  Affect:  Congruent  Thought Process:  Coherent and Descriptions of Associations: Intact  Orientation:  Full (Time, Place, and Person)  Thought Content:  WDL  Suicidal Thoughts:  No  Homicidal Thoughts:  No  Memory:  Immediate;   Good Recent;   Good Remote;   Good  Judgement:  Fair  Insight:  Fair  Psychomotor Activity:  Normal  Concentration:  Concentration: Good and Attention Span: Good  Recall:  [employer] of Knowledge:  Fair  Language:  Good  Akathisia:  No  Handed:  Right  AIMS (if indicated):     Assets:  Housing Leisure Time Physical Health Resilience Social Support  ADL's:  Intact  Cognition:  WNL  Sleep:      Mental Status Per Nursing Assessment::   On Admission:   anxiety with suicidal ideations  Demographic Factors:  Adolescent or young adult  Loss Factors: NA  Historical Factors: NA  Risk Reduction Factors:   Sense of responsibility to family, Living with another person, especially a relative, Positive social support and Positive therapeutic relationship  Continued Clinical Symptoms:  None  Cognitive Features That Contribute To Risk:  None    Suicide Risk:  Minimal: No identifiable suicidal ideation.  Patients presenting with no risk factors but with morbid ruminations; may be classified as minimal risk based on the severity of the depressive symptoms    Plan Of Care/Follow-up recommendations:  Activity:  as tolerated Diet:  heart healthy diet  Illeana Edick, NP 03/20/2016, 12:52 PM

## 2016-03-20 NOTE — ED Notes (Signed)
Introduced self to patient/family. Pt oriented to unit expectations.  Assessed pt for:  A) Anxiety &/or agitation: Pt is calm and cooperative and said that she wants to go home.   S) Safety: Safety maintained with q-15-minute checks and hourly rounds by staff.  A) ADLs: Pt able to complete own ADLs independently.   P) Pick-Up (room cleanliness): Pt's room free from clotter.

## 2016-03-20 NOTE — Discharge Instructions (Signed)
For your ongoing behavioral health needs, you are advised to follow up with [facility], your outpatient provider:       [facility]      [address], [place], [place] [address]      [phone]

## 2016-03-20 NOTE — ED Notes (Signed)
Pt discharged home. Discharged instructions read to pt who verbalized understanding. All belongings returned to pt who signed for same. Denies SI/HI, is not delusional and not responding to internal stimuli. Escorted pt to the ED exit. Pt given bus ticket.

## 2016-03-20 NOTE — BH Assessment (Signed)
[place] Assessment Progress Note  Per Ambrose Finland, MD, this pt does not require psychiatric hospitalization at this time, and pt is to be discharged from [facility]  She sees [facility] for outpatient treatment, and she is to be advised to continue this treatment, which has been included in her discharge instructions.  Pt's nurse, Dawnaly, has been notified, and she has been given a [place] bus pass to provide for pt's transportation back home upon discharge.  Jalene Mullet, [place] Triage Specialist [phone]

## 2016-04-06 ENCOUNTER — Encounter (HOSPITAL_BASED_OUTPATIENT_CLINIC_OR_DEPARTMENT_OTHER): Payer: Self-pay

## 2016-04-06 DIAGNOSIS — R0683 Snoring: Secondary | ICD-10-CM

## 2016-04-13 ENCOUNTER — Emergency Department (HOSPITAL_COMMUNITY)
Admission: EM | Admit: 2016-04-13 | Discharge: 2016-04-14 | Disposition: A | Discharge status: Home / Self Care | Payer: Medicaid Other | Attending: Emergency Medicine | Admitting: Emergency Medicine

## 2016-04-13 ENCOUNTER — Encounter (HOSPITAL_COMMUNITY): Payer: Self-pay | Admitting: Neurology

## 2016-04-13 DIAGNOSIS — Z87891 Personal history of nicotine dependence: Secondary | ICD-10-CM | POA: Insufficient documentation

## 2016-04-13 DIAGNOSIS — R110 Nausea: Secondary | ICD-10-CM

## 2016-04-13 DIAGNOSIS — R1084 Generalized abdominal pain: Secondary | ICD-10-CM | POA: Diagnosis present

## 2016-04-13 DIAGNOSIS — E119 Type 2 diabetes mellitus without complications: Secondary | ICD-10-CM | POA: Diagnosis not present

## 2016-04-13 DIAGNOSIS — Z7984 Long term (current) use of oral hypoglycemic drugs: Secondary | ICD-10-CM | POA: Insufficient documentation

## 2016-04-13 DIAGNOSIS — I10 Essential (primary) hypertension: Secondary | ICD-10-CM | POA: Insufficient documentation

## 2016-04-13 LAB — COMPREHENSIVE METABOLIC PANEL
ALBUMIN: 4.2 g/dL (ref 3.5–5.0)
ALK PHOS: 42 U/L (ref 38–126)
ALT: 45 U/L (ref 14–54)
ANION GAP: 9 (ref 5–15)
AST: 46 U/L — AB (ref 15–41)
BUN: 8 mg/dL (ref 6–20)
CALCIUM: 9.8 mg/dL (ref 8.9–10.3)
CO2: 29 mmol/L (ref 22–32)
Chloride: 103 mmol/L (ref 101–111)
Creatinine, Ser: 0.86 mg/dL (ref 0.44–1.00)
GFR calc Af Amer: >60 mL/min (ref >60)
GFR calc non Af Amer: >60 mL/min (ref >60)
GLUCOSE: 166 mg/dL — AB (ref 65–99)
Potassium: 3.5 mmol/L (ref 3.5–5.1)
Sodium: 141 mmol/L (ref 135–145)
TOTAL PROTEIN: 7.1 g/dL (ref 6.5–8.1)
Total Bilirubin: 0.8 mg/dL (ref 0.3–1.2)

## 2016-04-13 LAB — URINALYSIS, ROUTINE W REFLEX MICROSCOPIC
Bacteria, UA: NONE SEEN
Bilirubin Urine: NEGATIVE
Glucose, UA: NEGATIVE mg/dL
Hgb urine dipstick: NEGATIVE
Ketones, ur: NEGATIVE mg/dL
Leukocytes, UA: NEGATIVE
Nitrite: NEGATIVE
PH: 6 (ref 5.0–8.0)
Protein, ur: 100 mg/dL — AB
SPECIFIC GRAVITY, URINE: 1.027 (ref 1.005–1.030)

## 2016-04-13 LAB — PREGNANCY, URINE: Preg Test, Ur: NEGATIVE

## 2016-04-13 MED ORDER — ONDANSETRON HCL 4 MG/2ML IJ SOLN: 4.0000 mg | Freq: Once | INTRAMUSCULAR | Status: DC

## 2016-04-13 MED ORDER — ONDANSETRON HCL 4 MG PO TABS: 4.0000 mg | ORAL_TABLET | Freq: Three times a day (TID) | ORAL | 0 refills | Status: DC | PRN

## 2016-04-13 MED ORDER — SODIUM CHLORIDE 0.9 % IV BOLUS (SEPSIS): 1000.0000 mL | Freq: Once | INTRAVENOUS | Status: DC

## 2016-04-13 MED ORDER — ONDANSETRON 4 MG PO TBDP
8.0000 mg | ORAL_TABLET | Freq: Once | ORAL | Status: AC
  Administered 2016-04-13: 8 mg via ORAL
  Filled 2016-04-13: qty 2

## 2016-04-13 NOTE — Care Management Note (Signed)
Case Management Note  Patient Details  Name: [name] MRN: [id number] Date of Birth: 09/08/1990  Subjective/Objective:               Patient has had 28 ED visits in the past 6 months, none which resulted in a hospital admission. Patient presented to [facility] ED with complaints of Abdominal pains. Patient has [facility] and PCP Osei Bonu. ED CM met with patient at bedside to discuss assistance with care coordination. Patient reports being turned away from her PCP due to an outstanding balance of $15.  CM obtained consent to contact PCP office regarding office follow-up in the appropriate setting as opposed to the ED.  CM will follow up in the am  04/14/2016 16:40 CM contacted PCP office concerning the frequent ED visits. CM was informed that patient does not have a block on record due to outstanding balance and patient next appointment is  Tomorrow 12/20 at 2p. CM contacted patient and updated her on this information. No further CM needs identified.     Action/Plan:   Expected Discharge Date:    12 18/2017             Expected Discharge Plan:  Home/Self Care  In-House Referral:     Discharge planning Services  CM Consult  Post Acute Care Choice:    Choice offered to:  Patient  DME Arranged:    DME Agency:  NA  HH Arranged:  NA HH Agency:     Status of Service:  Completed, signed off  If discussed at [place] of Stay Meetings, dates discussed:    Additional CommentsLaurena Slimmer, RN 04/13/2016, 11:37 PM

## 2016-04-13 NOTE — ED Triage Notes (Signed)
Pt here reporting lower abd pain and lower back pain that is chronic. Went to [employer] and vomited after eating. Reports this is her chronic problem. These are her same problems and she has a care plan. Pt is a x 4. In NAD

## 2016-04-13 NOTE — ED Notes (Signed)
IV attempted X2 by Caitlynn RN, attempted X1 with this RN. EDP aware, PO challenge and PO Zofran instead

## 2016-04-13 NOTE — ED Provider Notes (Signed)
[facility] DEPT Provider Note   CSN: [id number] Arrival date & time: 04/13/16  1718     History   Chief Complaint Chief Complaint  Patient presents with  . Abdominal Pain    HPI [name] is a 25 y.o. female.  Pt is a 25 y/o F with PMH of bipolar disorder, anxiety, depression, NIDDM, htn, and cognitive deficits who presents to ED for generalized abdominal pain, onset today at 1500 after eating [place], describes as cramping, with associated nausea and one episode of nonbloody emesis. Took zofran with moderate relief. Denies recent travel or known sick contacts. Denies fever, chills, dizziness, Cp, SOB, hematemesis, diarrhea, dysuria, urinary frequency, vaginal discharge/bleeding, or any additional concerns. No anticoag or recent NSAID use. LMP approx 3 years ago.    The history is provided by the patient. No language interpreter was used.  Abdominal Pain   Associated symptoms include nausea and vomiting. Pertinent negatives include fever, diarrhea, dysuria, frequency and headaches.    Past Medical History:  Diagnosis Date  . Anxiety   . Bipolar 1 disorder ([place])   . Cognitive deficits   . Depression   . Diabetes mellitus without complication ([place])   . Hypertension   . Mental disorder   . Obesity     Patient Active Problem List   Diagnosis Date Noted  . 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 Learning disability 07/18/2015  . 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 Diabetes mellitus ([place]) 07/18/2015  . MDD (major depressive disorder), recurrent, severe, with psychosis ([place]) 07/18/2015  . Hyperlipidemia 07/18/2015  . Drug overdose   . Cognitive deficits 10/12/2012  . Generalized anxiety disorder 06/28/2012    Past Surgical History:  Procedure Laterality Date  . CESAREAN SECTION    . CESAREAN SECTION N/A 04/25/2013   Procedure: REPEAT CESAREAN SECTION;  Surgeon: [name], MD;  Location: [facility];  Service: Obstetrics;  Laterality: N/A;    . MASS EXCISION N/A 06/03/2012   Procedure: EXCISION MASS;  Surgeon: [name], MD;  Location: [facility];  Service: ENT;  Laterality: N/A;  Excision uvula mass  . TONSILLECTOMY N/A 06/03/2012   Procedure: TONSILLECTOMY;  Surgeon: [name], MD;  Location: [facility];  Service: ENT;  Laterality: N/A;  . TONSILLECTOMY      OB History    Gravida Para Term Preterm AB Living   3 3 3  0 0 3   SAB TAB Ectopic Multiple Live Births   0 0 0 0 3       Home Medications    Prior to Admission medications   Medication Sig Start Date End Date Taking? Authorizing Provider  busPIRone (BUSPAR) 15 MG tablet Take 15 mg by mouth every evening.     Historical Provider, MD  DULoxetine (CYMBALTA) 60 MG capsule Take 60 mg by mouth at bedtime.     Historical Provider, MD  fluvoxaMINE (LUVOX) 100 MG tablet Take 100 mg by mouth at bedtime.     Historical Provider, MD  glipiZIDE (GLUCOTROL XL) 10 MG 24 hr tablet Take 10 mg by mouth daily with breakfast.    Historical Provider, MD  hydrochlorothiazide (HYDRODIURIL) 25 MG tablet Take 25 mg by mouth daily.    Historical Provider, MD  hydrOXYzine (VISTARIL) 25 MG capsule Take 25 mg by mouth 3 (three) times daily as needed for anxiety.    Historical Provider, MD  metFORMIN (GLUCOPHAGE) 500 MG tablet Take 500 mg by mouth 2 (two) times daily with a meal.  Historical Provider, MD  methocarbamol (ROBAXIN) 500 MG tablet Take 1 tablet (500 mg total) by mouth 2 (two) times daily. Patient not taking: Reported on 03/19/2016 01/18/16   Janne Napoleon, NP  paliperidone (INVEGA SUSTENNA) 234 MG/1.5ML SUSP injection Inject 234 mg into the muscle every 30 (thirty) days.    Historical Provider, MD  promethazine (PHENERGAN) 25 MG tablet Take 1 tablet (25 mg total) by mouth every 8 (eight) hours as needed for nausea or vomiting. 03/11/16   Quintella Reichert, MD  ranitidine (ZANTAC) 150 MG capsule Take 1 capsule (150 mg total) by mouth daily. Patient  not taking: Reported on 03/19/2016 01/04/16   Waynetta Pean, PA-C    Family History Family History  Problem Relation Age of Onset  . Hypertension Mother   . Diabetes Father     Social History Social History  Substance Use Topics  . Smoking status: Former Smoker    Packs/day: 1.00    Years: 7.00    Types: Cigarettes    Quit date: 06/30/2015  . Smokeless tobacco: Never Used  . Alcohol use No     Allergies   Omnipaque [iohexol] and Penicillins   Review of Systems Review of Systems  Constitutional: Negative for chills, fever and unexpected weight change.  Respiratory: Negative for cough and shortness of breath.   Cardiovascular: Negative for chest pain.  Gastrointestinal: Positive for abdominal pain, nausea and vomiting. Negative for blood in stool and diarrhea.  Genitourinary: Negative for dysuria, flank pain, frequency, vaginal bleeding and vaginal discharge.  Neurological: Negative for dizziness, weakness and headaches.     Physical Exam Updated Vital Signs BP 136/85   Pulse 106   Temp 98 F (36.7 C) (Oral)   Resp 18   Ht 5\' 7"  (1.702 m)   Wt 122.5 kg   SpO2 99%   BMI 42.29 kg/m   Physical Exam  Constitutional: She is oriented to person, place, and time. She appears well-developed and well-nourished.  HENT:  Head: Normocephalic.  Eyes: EOM are normal. Pupils are equal, round, and reactive to light.  Neck: Normal range of motion. Neck supple.  Cardiovascular: Normal rate, regular rhythm, normal heart sounds and intact distal pulses.   Pulmonary/Chest: Effort normal and breath sounds normal.  Abdominal: Soft. Bowel sounds are normal. There is no tenderness (mild generalized abd pain, unable to localize). There is no rebound and no guarding.  Negative Murphys, negative mcburneys  Musculoskeletal: Normal range of motion.  Neurological: She is alert and oriented to person, place, and time.  Skin: Skin is warm and dry.  Vitals reviewed.    ED Treatments /  Results  Labs (all labs ordered are listed, but only abnormal results are displayed) Labs Reviewed  CBC WITH DIFFERENTIAL/PLATELET  COMPREHENSIVE METABOLIC PANEL  URINALYSIS, ROUTINE W REFLEX MICROSCOPIC  PREGNANCY, URINE    EKG  EKG Interpretation None       Radiology No results found.  Procedures Procedures (including critical care time)  Medications Ordered in ED Medications  sodium chloride 0.9 % bolus 1,000 mL (not administered)  ondansetron (ZOFRAN) injection 4 mg (not administered)     Initial Impression / Assessment and Plan / ED Course  I have reviewed the triage vital signs and the nursing notes.  Pertinent labs & imaging results that were available during my care of the patient were reviewed by me and considered in my medical decision making (see chart for details).  Clinical Course    Pt is a 25 y/o F, afebrile, who  presents to ED for abdominal pain and n/v, concern for gastroenteritis. Will get labs for lyte abnormality, UA for infection. Doubt appy, diverticulitis, SBO, or perforation. Plan for zofran and re-eval  10:48 PM Pt tolerating Po. Pt updated on results, will continue to monitor.  11:21 PM Discussed results with pt; plan for follow up with her primary care physician. Continues to tolerate Po. Discussed discharge instructions and return precautions; pt verbalizes understanding, denies any additional concerns.  Final Clinical Impressions(s) / ED Diagnoses   Final diagnoses:  None    New Prescriptions New Prescriptions   No medications on file     Ulice Bold, NP 04/13/16 [id number]    Noemi Chapel, MD 04/15/16 [id number]

## 2016-04-13 NOTE — ED Notes (Signed)
Pt given water for fluid challenge. Pt tolerating so far. Will continue to monitor.

## 2016-04-14 LAB — CBC WITH DIFFERENTIAL/PLATELET
Basophils Absolute: 0 10*3/uL (ref 0.0–0.1)
Basophils Relative: 0 %
Eosinophils Absolute: 0.3 10*3/uL (ref 0.0–0.7)
Eosinophils Relative: 3 %
HEMATOCRIT: 37.2 % (ref 36.0–46.0)
HEMOGLOBIN: 12 g/dL (ref 12.0–15.0)
LYMPHS ABS: 3.9 10*3/uL (ref 0.7–4.0)
LYMPHS PCT: 38 %
MCH: 26.4 pg (ref 26.0–34.0)
MCHC: 32.3 g/dL (ref 30.0–36.0)
MCV: 81.8 fL (ref 78.0–100.0)
MONOS PCT: 7 %
Monocytes Absolute: 0.8 10*3/uL (ref 0.1–1.0)
NEUTROS ABS: 5.4 10*3/uL (ref 1.7–7.7)
NEUTROS PCT: 51 %
Platelets: 200 10*3/uL (ref 150–400)
RBC: 4.55 MIL/uL (ref 3.87–5.11)
RDW: 14.8 % (ref 11.5–15.5)
WBC: 10.4 10*3/uL (ref 4.0–10.5)

## 2016-04-14 NOTE — ED Notes (Signed)
Pt also refused to let this RN get last set of vitals

## 2016-04-14 NOTE — Discharge Instructions (Signed)
Return to ER if you experience fevers, chills, unexplained weight loss, dizziness, chest pain, shortness of breath, change in/persistent/worsening abdominal pain, unable to tolerate food or fluids, blood in urine or stool, worsening symptoms, or any additional concerns. Call to schedule a follow up appointment with your primary care doctor.

## 2016-04-14 NOTE — ED Notes (Signed)
Pt refused to take DC instructions, refused to sign e-signature. Pt back bc taxi voucher not provided

## 2016-04-21 ENCOUNTER — Emergency Department (HOSPITAL_COMMUNITY)
Admission: EM | Admit: 2016-04-21 | Discharge: 2016-04-21 | Disposition: A | Discharge status: Home / Self Care | Payer: Medicaid Other | Attending: Emergency Medicine | Admitting: Emergency Medicine

## 2016-04-21 ENCOUNTER — Encounter (HOSPITAL_COMMUNITY): Payer: Self-pay | Admitting: *Deleted

## 2016-04-21 DIAGNOSIS — I10 Essential (primary) hypertension: Secondary | ICD-10-CM | POA: Insufficient documentation

## 2016-04-21 DIAGNOSIS — R102 Pelvic and perineal pain: Secondary | ICD-10-CM

## 2016-04-21 DIAGNOSIS — E119 Type 2 diabetes mellitus without complications: Secondary | ICD-10-CM | POA: Insufficient documentation

## 2016-04-21 DIAGNOSIS — Z87891 Personal history of nicotine dependence: Secondary | ICD-10-CM | POA: Diagnosis not present

## 2016-04-21 DIAGNOSIS — R1030 Lower abdominal pain, unspecified: Secondary | ICD-10-CM | POA: Diagnosis present

## 2016-04-21 DIAGNOSIS — Z8781 Personal history of (healed) traumatic fracture: Secondary | ICD-10-CM | POA: Diagnosis not present

## 2016-04-21 DIAGNOSIS — Z7984 Long term (current) use of oral hypoglycemic drugs: Secondary | ICD-10-CM | POA: Diagnosis not present

## 2016-04-21 LAB — URINALYSIS, ROUTINE W REFLEX MICROSCOPIC
Bilirubin Urine: NEGATIVE
Glucose, UA: NEGATIVE mg/dL
HGB URINE DIPSTICK: NEGATIVE
Ketones, ur: NEGATIVE mg/dL
Leukocytes, UA: NEGATIVE
NITRITE: NEGATIVE
PROTEIN: 30 mg/dL — AB
Specific Gravity, Urine: 1.03 (ref 1.005–1.030)
pH: 5 (ref 5.0–8.0)

## 2016-04-21 LAB — POC URINE PREG, ED: PREG TEST UR: NEGATIVE

## 2016-04-21 NOTE — ED Triage Notes (Signed)
Pt c/o abdominal pain x 1 year, has ED care plan due to many visits for the same. Pt states burning with urination x 1 week. New onset white vaginal discharge and itching as well as burning during urination x 1 week, all of which is new. Unsure if discharge has odor.

## 2016-04-21 NOTE — ED Notes (Signed)
Pilar Plate, PA at bedside.
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
## 2016-04-21 NOTE — Progress Notes (Addendum)
Pt states she was seen on last week for her stomach states done a pelvic exam and labs to check for cancer and no results back Pt states something is going on with "my kidneys and I have been going through this for a year.  I have no diagnosis"  Pt denies referral to GI provider Pt did not know initially what a GI  Pt had same s/s last week and report "got worst"  Pt states she did not get any medicine at pcp nor dx  Pt with [facility] ED visits and no admissions in the last 6 months  Pt states she did speak with [facility] ED CM and is aware there is not an outstanding bill Pt made aware of need to have medicaid pcp make referral to GI  Pt with a ED CP that has been updated  Pt with a female visitor and a elementary age very active female in her room who is needing frequent redirections to get off stool and rolling stool in pt room

## 2016-04-21 NOTE — ED Notes (Signed)
Pt given a [place] sandwich and a sprite per pt request.

## 2016-04-21 NOTE — ED Provider Notes (Signed)
[facility] DEPT Provider Note   CSN: [id number] Arrival date & time: 04/21/16  0944     History   Chief Complaint Chief Complaint  Patient presents with  . Abdominal Pain  . Vaginal Discharge    HPI [name] is a 25 y.o. female with past medical history of bipolar disorder, diabetes, obesity with an active care plan presents to the ED presenting with 3 day history of worsening suprapubic pain. Patient reports associated dysuria, frequency, urgency, vaginal itching and vaginal dryness. Patient states that she's never had anything like this before. Patient denies back pain, fever, chills, vaginal discharge to me, or vaginal bleeding.. Patient reports chronic abdominal pain. Patient reports not being sexually active.   The history is provided by the patient.  Abdominal Pain   Associated symptoms include dysuria and frequency. Pertinent negatives include fever, vomiting and hematuria.  Vaginal Discharge   Associated symptoms include abdominal pain, dysuria and frequency. Pertinent negatives include no fever and no vomiting.    Past Medical History:  Diagnosis Date  . Anxiety   . Bipolar 1 disorder ([place])   . Cognitive deficits   . Depression   . Diabetes mellitus without complication ([place])   . Hypertension   . Mental disorder   . Obesity     Patient Active Problem List   Diagnosis Date Noted  . 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 Learning disability 07/18/2015  . 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 Diabetes mellitus ([place]) 07/18/2015  . 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 Hyperlipidemia 07/18/2015  . Drug overdose   . Cognitive deficits 10/12/2012  . Generalized anxiety disorder 06/28/2012    Past Surgical History:  Procedure Laterality Date  . CESAREAN SECTION    . CESAREAN SECTION N/A 04/25/2013   Procedure: REPEAT CESAREAN SECTION;  Surgeon: [name], MD;  Location: [facility];   Service: Obstetrics;  Laterality: N/A;  . MASS EXCISION N/A 06/03/2012   Procedure: EXCISION MASS;  Surgeon: [name], MD;  Location: [facility];  Service: ENT;  Laterality: N/A;  Excision uvula mass  . TONSILLECTOMY N/A 06/03/2012   Procedure: TONSILLECTOMY;  Surgeon: [name], MD;  Location: [facility];  Service: ENT;  Laterality: N/A;  . TONSILLECTOMY      OB History    Gravida Para Term Preterm AB Living   3 3 3  0 0 3   SAB TAB Ectopic Multiple Live Births   0 0 0 0 3       Home Medications    Prior to Admission medications   Medication Sig Start Date End Date Taking? Authorizing Provider  busPIRone (BUSPAR) 15 MG tablet Take 15 mg by mouth every evening.    Yes Historical Provider, MD  DULoxetine (CYMBALTA) 60 MG capsule Take 60 mg by mouth at bedtime.    Yes Historical Provider, MD  fluvoxaMINE (LUVOX) 100 MG tablet Take 100 mg by mouth at bedtime.    Yes Historical Provider, MD  glipiZIDE (GLUCOTROL XL) 10 MG 24 hr tablet Take 10 mg by mouth daily with breakfast.   Yes Historical Provider, MD  hydrochlorothiazide (HYDRODIURIL) 25 MG tablet Take 25 mg by mouth daily.   Yes Historical Provider, MD  metFORMIN (GLUCOPHAGE) 500 MG tablet Take 500 mg by mouth 2 (two) times daily with a meal.   Yes Historical Provider, MD  ondansetron (ZOFRAN) 4 MG tablet Take 1 tablet (4 mg total) by mouth every 8 (eight) hours  as needed for nausea or vomiting. 04/13/16  Yes Ulice Bold, NP  paliperidone (INVEGA SUSTENNA) 234 MG/1.5ML SUSP injection Inject 234 mg into the muscle every 30 (thirty) days.   Yes Historical Provider, MD  promethazine (PHENERGAN) 25 MG tablet Take 1 tablet (25 mg total) by mouth every 8 (eight) hours as needed for nausea or vomiting. Patient not taking: Reported on 04/21/2016 03/11/16   Quintella Reichert, MD    Family History Family History  Problem Relation Age of Onset  . Hypertension Mother   . Diabetes Father     Social  History Social History  Substance Use Topics  . Smoking status: Former Smoker    Packs/day: 1.00    Years: 7.00    Types: Cigarettes    Quit date: 06/30/2015  . Smokeless tobacco: Never Used  . Alcohol use No     Allergies   Omnipaque [iohexol] and Penicillins   Review of Systems Review of Systems  Constitutional: Negative for chills and fever.  Respiratory: Negative for shortness of breath.   Cardiovascular: Negative for chest pain.  Gastrointestinal: Positive for abdominal pain. Negative for vomiting.  Genitourinary: Positive for difficulty urinating, dysuria, frequency and urgency. Negative for hematuria, vaginal bleeding and vaginal discharge.       Vaginal dryness and itching.  Musculoskeletal: Negative for back pain.     Physical Exam Updated Vital Signs BP 119/69 (BP Location: Left Arm)   Pulse 96   Temp 98.9 F (37.2 C) (Oral)   Resp 17   SpO2 98%   Physical Exam  Constitutional: She is oriented to person, place, and time. She appears well-developed and well-nourished.  HENT:  Head: Normocephalic and atraumatic.  Nose: Nose normal.  Eyes: Conjunctivae and EOM are normal. Pupils are equal, round, and reactive to light.  Neck: Normal range of motion. Neck supple.  Cardiovascular: Normal rate and normal heart sounds.   Pulmonary/Chest: Effort normal and breath sounds normal. No respiratory distress. She exhibits no tenderness.  Abdominal: Soft. Bowel sounds are normal. There is tenderness.  Musculoskeletal: Normal range of motion.  Neurological: She is alert and oriented to person, place, and time.  Skin: Skin is warm. Capillary refill takes less than 2 seconds.  Psychiatric: She has a normal mood and affect. Her behavior is normal.  Nursing note and vitals reviewed.    ED Treatments / Results  Labs (all labs ordered are listed, but only abnormal results are displayed) Labs Reviewed  URINALYSIS, ROUTINE W REFLEX MICROSCOPIC - Abnormal; Notable for the  following:       Result Value   APPearance HAZY (*)    Protein, ur 30 (*)    Bacteria, UA RARE (*)    Squamous Epithelial / LPF 0-5 (*)    All other components within normal limits  POC URINE PREG, ED    EKG  EKG Interpretation None       Radiology No results found.  Procedures Procedures (including critical care time)  Medications Ordered in ED Medications - No data to display   Initial Impression / Assessment and Plan / ED Course  I have reviewed the triage vital signs and the nursing notes.  Pertinent labs & imaging results that were available during my care of the patient were reviewed by me and considered in my medical decision making (see chart for details).  Clinical Course   Patient is a 25 year old female presenting with suprapubic pain. Patient is well-known to the ED and has ED care plan. Patient  also has history of yeast infection. On exam patient is afebrile, hemodynamically stable, in no apparent distress. Heart and lung sounds are clear. Patient has chronic abdominal pain.  Patient more tender to lower abdomen. Urinalysis does not show findings of urinary tract infection. Patient denies pelvic exam. She states that she will follow-up with her primary care physician instead and asking for discharge. I feel patient is safe for discharge. Patient instructed to drink plenty fluids and to schedule point with the primary care physician today. Return precautions given for any worsening symptoms such as fevers, chills, back pain, chest pain, shortness of breath.  Final Clinical Impressions(s) / ED Diagnoses   Final diagnoses:  Pelvic pain in female    New Prescriptions Discharge Medication List as of 04/21/2016  1:56 PM       [place], [place] 04/21/16 [place], MD 05/01/16 [phone]

## 2016-04-21 NOTE — ED Notes (Signed)
Pt left prior to receiving discharge instructions.  Pt last seen a/o x 4 and ambulatory.  ABCD's intact.

## 2016-04-29 ENCOUNTER — Emergency Department (HOSPITAL_COMMUNITY)
Admission: EM | Admit: 2016-04-29 | Discharge: 2016-04-29 | Disposition: A | Discharge status: Home / Self Care | Payer: Medicaid Other | Attending: Emergency Medicine | Admitting: Emergency Medicine

## 2016-04-29 ENCOUNTER — Encounter (HOSPITAL_COMMUNITY): Payer: Self-pay | Admitting: *Deleted

## 2016-04-29 DIAGNOSIS — Z7984 Long term (current) use of oral hypoglycemic drugs: Secondary | ICD-10-CM | POA: Diagnosis not present

## 2016-04-29 DIAGNOSIS — E119 Type 2 diabetes mellitus without complications: Secondary | ICD-10-CM | POA: Diagnosis not present

## 2016-04-29 DIAGNOSIS — K921 Melena: Secondary | ICD-10-CM

## 2016-04-29 DIAGNOSIS — I10 Essential (primary) hypertension: Secondary | ICD-10-CM | POA: Insufficient documentation

## 2016-04-29 DIAGNOSIS — Z87891 Personal history of nicotine dependence: Secondary | ICD-10-CM | POA: Diagnosis not present

## 2016-04-29 LAB — COMPREHENSIVE METABOLIC PANEL
ALT: 38 U/L (ref 14–54)
AST: 40 U/L (ref 15–41)
Albumin: 4.5 g/dL (ref 3.5–5.0)
Alkaline Phosphatase: 47 U/L (ref 38–126)
Anion gap: 10 (ref 5–15)
BILIRUBIN TOTAL: 0.8 mg/dL (ref 0.3–1.2)
BUN: 14 mg/dL (ref 6–20)
CO2: 28 mmol/L (ref 22–32)
CREATININE: 0.82 mg/dL (ref 0.44–1.00)
Calcium: 9.5 mg/dL (ref 8.9–10.3)
Chloride: 101 mmol/L (ref 101–111)
Glucose, Bld: 123 mg/dL — ABNORMAL HIGH (ref 65–99)
Potassium: 3.7 mmol/L (ref 3.5–5.1)
Sodium: 139 mmol/L (ref 135–145)
TOTAL PROTEIN: 7.7 g/dL (ref 6.5–8.1)

## 2016-04-29 LAB — CBC
HEMATOCRIT: 37.2 % (ref 36.0–46.0)
Hemoglobin: 12 g/dL (ref 12.0–15.0)
MCH: 25.8 pg — AB (ref 26.0–34.0)
MCHC: 32.3 g/dL (ref 30.0–36.0)
MCV: 80 fL (ref 78.0–100.0)
Platelets: 215 10*3/uL (ref 150–400)
RBC: 4.65 MIL/uL (ref 3.87–5.11)
RDW: 14.4 % (ref 11.5–15.5)
WBC: 11.1 10*3/uL — AB (ref 4.0–10.5)

## 2016-04-29 LAB — PROTIME-INR
INR: 0.86
Prothrombin Time: 11.6 seconds (ref 11.4–15.2)

## 2016-04-29 LAB — POC OCCULT BLOOD, ED: Fecal Occult Bld: POSITIVE — AB

## 2016-04-29 MED ORDER — SODIUM CHLORIDE 0.9 % IV BOLUS (SEPSIS)
1000.0000 mL | Freq: Once | INTRAVENOUS | Status: AC
  Administered 2016-04-29: 1000 mL via INTRAVENOUS

## 2016-04-29 NOTE — ED Notes (Signed)
EDP [place] STATED OCCULT COLLECTED

## 2016-04-29 NOTE — ED Triage Notes (Signed)
Patient is alert and oriented x4.  She is complaining of blood in stool.  Patient has a Hx of hemorrhoids.  Currently she only has 5 of 10 pain along with vaginal itching.

## 2016-04-29 NOTE — ED Provider Notes (Signed)
[facility] DEPT Provider Note   CSN: [id number] Arrival date & time: 04/29/16  0218     History   Chief Complaint Chief Complaint  Patient presents with  . Blood In Stools    HPI [name] is a 26 y.o. female.  HPI  26 year old female presents with blood in her stools since last night. States she has had 3 such episodes. Each time there is blood in the stool and in the toilet. Denies any dizziness or lightheadedness. Has chronic abdominal pain that she states is currently mild and at baseline. No new abdominal pain. No nausea or vomiting or hematemesis. No similar symptoms to this before. No known hemorrhoids. There is no significant rectal pain. Denies being on blood thinners. Stool is softer but not diarrhea.  Past Medical History:  Diagnosis Date  . Anxiety   . Bipolar 1 disorder ([place])   . Cognitive deficits   . Depression   . Diabetes mellitus without complication ([place])   . Hypertension   . Mental disorder   . Obesity     Patient Active Problem List   Diagnosis Date Noted  . 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 Learning disability 07/18/2015  . 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 Diabetes mellitus ([place]) 07/18/2015  . 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 Hyperlipidemia 07/18/2015  . Drug overdose   . Cognitive deficits 10/12/2012  . Generalized anxiety disorder 06/28/2012    Past Surgical History:  Procedure Laterality Date  . CESAREAN SECTION    . CESAREAN SECTION N/A 04/25/2013   Procedure: REPEAT CESAREAN SECTION;  Surgeon: [name], MD;  Location: [facility];  Service: Obstetrics;  Laterality: N/A;  . MASS EXCISION N/A 06/03/2012   Procedure: EXCISION MASS;  Surgeon: [name], MD;  Location: [facility];  Service: ENT;  Laterality: N/A;  Excision uvula mass  . TONSILLECTOMY N/A 06/03/2012   Procedure: TONSILLECTOMY;  Surgeon: [name], MD;  Location: [facility];  Service: ENT;  Laterality: N/A;  . TONSILLECTOMY      OB History    Gravida Para Term Preterm AB Living   3 3 3  0 0 3   SAB TAB Ectopic Multiple Live Births   0 0 0 0 3       Home Medications    Prior to Admission medications   Medication Sig Start Date End Date Taking? Authorizing Provider  busPIRone (BUSPAR) 15 MG tablet Take 15 mg by mouth every evening.    Yes Historical Provider, MD  DULoxetine (CYMBALTA) 60 MG capsule Take 60 mg by mouth at bedtime.    Yes Historical Provider, MD  fluvoxaMINE (LUVOX) 100 MG tablet Take 100 mg by mouth at bedtime.    Yes Historical Provider, MD  glipiZIDE (GLUCOTROL XL) 10 MG 24 hr tablet Take 10 mg by mouth daily with breakfast.   Yes Historical Provider, MD  hydrochlorothiazide (HYDRODIURIL) 25 MG tablet Take 25 mg by mouth daily.   Yes Historical Provider, MD  metFORMIN (GLUCOPHAGE) 500 MG tablet Take 500 mg by mouth 2 (two) times daily with a meal.   Yes Historical Provider, MD  ondansetron (ZOFRAN) 4 MG tablet Take 1 tablet (4 mg total) by mouth every 8 (eight) hours as needed for nausea or vomiting. 04/13/16  Yes Bernadene Bell Wojeck, NP  paliperidone (INVEGA SUSTENNA) 234 MG/1.5ML SUSP injection Inject 234 mg into the muscle every 30 (thirty) days.   Yes Historical Provider, MD  promethazine (PHENERGAN) 25 MG tablet Take 1 tablet (25 mg total) by mouth every 8 (eight) hours as needed for nausea or vomiting. Patient not taking: Reported on 04/21/2016 03/11/16   Quintella Reichert, MD    Family History Family History  Problem Relation Age of Onset  . Hypertension Mother   . Diabetes Father     Social History Social History  Substance Use Topics  . Smoking status: Former Smoker    Packs/day: 1.00    Years: 7.00    Types: Cigarettes    Quit date: 06/30/2015  . Smokeless tobacco: Never Used  . Alcohol use No     Allergies   Omnipaque [iohexol] and Penicillins   Review of Systems Review of Systems    Gastrointestinal: Positive for abdominal pain and blood in stool. Negative for diarrhea, nausea, rectal pain and vomiting.  Neurological: Negative for dizziness and light-headedness.  All other systems reviewed and are negative.    Physical Exam Updated Vital Signs BP 113/73   Pulse 107   Temp 97.4 F (36.3 C) (Oral)   Resp 16   Ht 5\' 7"  (1.702 m)   Wt 270 lb (122.5 kg)   SpO2 100%   BMI 42.29 kg/m   Physical Exam  Constitutional: She is oriented to person, place, and time. She appears well-developed and well-nourished. No distress.  HENT:  Head: Normocephalic and atraumatic.  Right Ear: External ear normal.  Left Ear: External ear normal.  Nose: Nose normal.  Eyes: Right eye exhibits no discharge. Left eye exhibits no discharge.  Cardiovascular: Regular rhythm and normal heart sounds.  Tachycardia present.   HR low 100s-110s  Pulmonary/Chest: Effort normal and breath sounds normal.  Abdominal: Soft. She exhibits no distension. There is no tenderness.  Genitourinary: Rectal exam shows guaiac positive stool. Rectal exam shows no external hemorrhoid, no internal hemorrhoid, no fissure and anal tone normal.  Genitourinary Comments: Slight pink fluid on rectal exam, no stool  Neurological: She is alert and oriented to person, place, and time.  Skin: Skin is warm and dry. She is not diaphoretic.  Nursing note and vitals reviewed.    ED Treatments / Results  Labs (all labs ordered are listed, but only abnormal results are displayed) Labs Reviewed  CBC - Abnormal; Notable for the following:       Result Value   WBC 11.1 (*)    MCH 25.8 (*)    All other components within normal limits  COMPREHENSIVE METABOLIC PANEL - Abnormal; Notable for the following:    Glucose, Bld 123 (*)    All other components within normal limits  POC OCCULT BLOOD, ED - Abnormal; Notable for the following:    Fecal Occult Bld POSITIVE (*)    All other components within normal limits   PROTIME-INR    EKG  EKG Interpretation None       Radiology No results found.  Procedures Procedures (including critical care time)  Medications Ordered in ED Medications  sodium chloride 0.9 % bolus 1,000 mL (1,000 mLs Intravenous New Bag/Given 04/29/16 0615)     Initial Impression / Assessment and Plan / ED Course  I have reviewed the triage vital signs and the nursing notes.  Pertinent labs & imaging results that were available during my care of the patient were reviewed by me and considered in my medical decision making (see chart for details).  Clinical Course as of Apr 30 751  Wed Apr 29, 2016  0541 Labs, fluids, FOBT. Abd exam  benign. No syncope/near-syncope. Overall well appearing but mildly tachycardic.  [SG]  [id number] No bloody bowel movements while in the ED. Hemoglobin stable at 12. Labs otherwise benign. After her fluids RN, plan to discharge with outpatient GI follow-up and strict return precautions.  [SG]    Clinical Course User Index [SG] Sherwood Gambler, MD    Discussed strict return precautions such as abd pain, hematemesis, continued or worsening BRBPR or syncope/near syncope. Otherwise f/u with GI.  Final Clinical Impressions(s) / ED Diagnoses   Final diagnoses:  Blood in stool    New Prescriptions New Prescriptions   No medications on file     Sherwood Gambler, MD 04/29/16 [phone]

## 2016-04-29 NOTE — ED Notes (Signed)
ED Provider at bedside. EDP [place] 

## 2016-04-29 NOTE — ED Notes (Signed)
PT CAN BE DISCHARGED AFTER BOLUS COMPLETE

## 2016-05-01 ENCOUNTER — Encounter: Payer: Self-pay | Admitting: Nurse Practitioner

## 2016-05-02 ENCOUNTER — Encounter (HOSPITAL_COMMUNITY): Payer: Self-pay | Admitting: Emergency Medicine

## 2016-05-02 ENCOUNTER — Emergency Department (HOSPITAL_COMMUNITY)
Admission: EM | Admit: 2016-05-02 | Discharge: 2016-05-04 | Disposition: A | Discharge status: Home / Self Care | Payer: Medicaid Other | Attending: Emergency Medicine | Admitting: Emergency Medicine

## 2016-05-02 DIAGNOSIS — Z79899 Other long term (current) drug therapy: Secondary | ICD-10-CM | POA: Insufficient documentation

## 2016-05-02 DIAGNOSIS — Z7984 Long term (current) use of oral hypoglycemic drugs: Secondary | ICD-10-CM | POA: Insufficient documentation

## 2016-05-02 DIAGNOSIS — E119 Type 2 diabetes mellitus without complications: Secondary | ICD-10-CM | POA: Diagnosis not present

## 2016-05-02 DIAGNOSIS — F332 Major depressive disorder, recurrent severe without psychotic features: Secondary | ICD-10-CM | POA: Diagnosis not present

## 2016-05-02 DIAGNOSIS — Z9889 Other specified postprocedural states: Secondary | ICD-10-CM | POA: Diagnosis not present

## 2016-05-02 DIAGNOSIS — F819 Developmental disorder of scholastic skills, unspecified: Secondary | ICD-10-CM | POA: Diagnosis not present

## 2016-05-02 DIAGNOSIS — F639 Impulse disorder, unspecified: Secondary | ICD-10-CM | POA: Diagnosis present

## 2016-05-02 DIAGNOSIS — Z87891 Personal history of nicotine dependence: Secondary | ICD-10-CM | POA: Insufficient documentation

## 2016-05-02 DIAGNOSIS — R45851 Suicidal ideations: Secondary | ICD-10-CM | POA: Diagnosis present

## 2016-05-02 DIAGNOSIS — F330 Major depressive disorder, recurrent, mild: Secondary | ICD-10-CM | POA: Diagnosis not present

## 2016-05-02 DIAGNOSIS — Z8249 Family history of ischemic heart disease and other diseases of the circulatory system: Secondary | ICD-10-CM | POA: Diagnosis not present

## 2016-05-02 DIAGNOSIS — F411 Generalized anxiety disorder: Secondary | ICD-10-CM | POA: Diagnosis present

## 2016-05-02 DIAGNOSIS — F333 Major depressive disorder, recurrent, severe with psychotic symptoms: Secondary | ICD-10-CM | POA: Diagnosis present

## 2016-05-02 DIAGNOSIS — I10 Essential (primary) hypertension: Secondary | ICD-10-CM | POA: Insufficient documentation

## 2016-05-02 LAB — BASIC METABOLIC PANEL
ANION GAP: 8 (ref 5–15)
BUN: 12 mg/dL (ref 6–20)
CHLORIDE: 101 mmol/L (ref 101–111)
CO2: 29 mmol/L (ref 22–32)
Calcium: 9.1 mg/dL (ref 8.9–10.3)
Creatinine, Ser: 0.94 mg/dL (ref 0.44–1.00)
GFR calc Af Amer: >60 mL/min (ref >60)
GFR calc non Af Amer: >60 mL/min (ref >60)
GLUCOSE: 113 mg/dL — AB (ref 65–99)
POTASSIUM: 3.5 mmol/L (ref 3.5–5.1)
Sodium: 138 mmol/L (ref 135–145)

## 2016-05-02 LAB — URINALYSIS, ROUTINE W REFLEX MICROSCOPIC
Bilirubin Urine: NEGATIVE
GLUCOSE, UA: NEGATIVE mg/dL
Hgb urine dipstick: NEGATIVE
KETONES UR: NEGATIVE mg/dL
LEUKOCYTES UA: NEGATIVE
Nitrite: NEGATIVE
Protein, ur: 30 mg/dL — AB
SPECIFIC GRAVITY, URINE: 1.03 (ref 1.005–1.030)
pH: 6 (ref 5.0–8.0)

## 2016-05-02 LAB — URINALYSIS, MICROSCOPIC (REFLEX)

## 2016-05-02 LAB — CBC WITH DIFFERENTIAL/PLATELET
Basophils Absolute: 0.1 10*3/uL (ref 0.0–0.1)
Basophils Relative: 1 %
Eosinophils Absolute: 0.4 10*3/uL (ref 0.0–0.7)
Eosinophils Relative: 4 %
HEMATOCRIT: 39.9 % (ref 36.0–46.0)
HEMOGLOBIN: 13 g/dL (ref 12.0–15.0)
LYMPHS PCT: 32 %
Lymphs Abs: 3.3 10*3/uL (ref 0.7–4.0)
MCH: 26.5 pg (ref 26.0–34.0)
MCHC: 32.6 g/dL (ref 30.0–36.0)
MCV: 81.3 fL (ref 78.0–100.0)
MONO ABS: 0.7 10*3/uL (ref 0.1–1.0)
MONOS PCT: 7 %
NEUTROS ABS: 5.8 10*3/uL (ref 1.7–7.7)
Neutrophils Relative %: 56 %
Platelets: 246 10*3/uL (ref 150–400)
RBC: 4.91 MIL/uL (ref 3.87–5.11)
RDW: 14.7 % (ref 11.5–15.5)
WBC: 10.3 10*3/uL (ref 4.0–10.5)

## 2016-05-02 LAB — RAPID URINE DRUG SCREEN, HOSP PERFORMED
Amphetamines: NOT DETECTED
BENZODIAZEPINES: NOT DETECTED
Barbiturates: NOT DETECTED
Cocaine: NOT DETECTED
Opiates: NOT DETECTED
TETRAHYDROCANNABINOL: NOT DETECTED

## 2016-05-02 LAB — ACETAMINOPHEN LEVEL: Acetaminophen (Tylenol), Serum: <10 ug/mL — ABNORMAL LOW (ref 10–30)

## 2016-05-02 LAB — I-STAT BETA HCG BLOOD, ED (MC, WL, AP ONLY)

## 2016-05-02 LAB — ETHANOL

## 2016-05-02 LAB — CBG MONITORING, ED: GLUCOSE-CAPILLARY: 153 mg/dL — AB (ref 65–99)

## 2016-05-02 LAB — SALICYLATE LEVEL: Salicylate Lvl: <7 mg/dL (ref 2.8–30.0)

## 2016-05-02 MED ORDER — FLUVOXAMINE MALEATE 100 MG PO TABS
100.0000 mg | ORAL_TABLET | Freq: Every day | ORAL | Status: DC
  Administered 2016-05-02 – 2016-05-03 (×2): 100 mg via ORAL
  Filled 2016-05-02 (×5): qty 1

## 2016-05-02 MED ORDER — BUSPIRONE HCL 10 MG PO TABS
15.0000 mg | ORAL_TABLET | Freq: Every evening | ORAL | Status: DC
  Administered 2016-05-02 – 2016-05-03 (×2): 15 mg via ORAL
  Filled 2016-05-02 (×2): qty 2

## 2016-05-02 MED ORDER — HYDROCHLOROTHIAZIDE 25 MG PO TABS
25.0000 mg | ORAL_TABLET | Freq: Every day | ORAL | Status: DC
  Administered 2016-05-02 – 2016-05-03 (×2): 25 mg via ORAL
  Filled 2016-05-02 (×4): qty 1

## 2016-05-02 MED ORDER — GLIPIZIDE ER 10 MG PO TB24
10.0000 mg | ORAL_TABLET | Freq: Every day | ORAL | Status: DC
  Administered 2016-05-03 – 2016-05-04 (×2): 10 mg via ORAL
  Filled 2016-05-02 (×2): qty 1

## 2016-05-02 MED ORDER — DULOXETINE HCL 30 MG PO CPEP
60.0000 mg | ORAL_CAPSULE | Freq: Every day | ORAL | Status: DC
  Administered 2016-05-02 – 2016-05-03 (×2): 60 mg via ORAL
  Filled 2016-05-02 (×3): qty 2

## 2016-05-02 MED ORDER — METFORMIN HCL 500 MG PO TABS
500.0000 mg | ORAL_TABLET | Freq: Two times a day (BID) | ORAL | Status: DC
  Administered 2016-05-03 – 2016-05-04 (×3): 500 mg via ORAL
  Filled 2016-05-02 (×3): qty 1

## 2016-05-02 NOTE — ED Provider Notes (Signed)
[facility] DEPT Provider Note   CSN: [id number] Arrival date & time: 05/02/16  1706     History   Chief Complaint Chief Complaint  Patient presents with  . Suicidal    HPI [name] is a 26 y.o. female.  Patient is a 26 year old female with a history of diabetes, hypertension, bipolar disorder and anxiety who presents with LEO after patient had bizarre behavior. She reportedly was shaving her head and was standing outside her house when police were called. She states she's been having thoughts of wanting to kill herself by taking an overdose. She denies any recent self-harm. She denies any current physical complaints other than she does have some nasal congestion and runny nose.      Past Medical History:  Diagnosis Date  . Anxiety   . Bipolar 1 disorder ([place])   . Cognitive deficits   . Depression   . Diabetes mellitus without complication ([place])   . Hypertension   . Mental disorder   . Obesity     Patient Active Problem List   Diagnosis Date Noted  . 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 Learning disability 07/18/2015  . 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 Diabetes mellitus ([place]) 07/18/2015  . 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 Hyperlipidemia 07/18/2015  . Drug overdose   . Cognitive deficits 10/12/2012  . Generalized anxiety disorder 06/28/2012    Past Surgical History:  Procedure Laterality Date  . CESAREAN SECTION    . CESAREAN SECTION N/A 04/25/2013   Procedure: REPEAT CESAREAN SECTION;  Surgeon: [name], MD;  Location: [facility];  Service: Obstetrics;  Laterality: N/A;  . MASS EXCISION N/A 06/03/2012   Procedure: EXCISION MASS;  Surgeon: [name], MD;  Location: [facility];  Service: ENT;  Laterality: N/A;  Excision uvula mass  . TONSILLECTOMY N/A 06/03/2012   Procedure: TONSILLECTOMY;  Surgeon: [name], MD;  Location: [facility];   Service: ENT;  Laterality: N/A;  . TONSILLECTOMY      OB History    Gravida Para Term Preterm AB Living   3 3 3  0 0 3   SAB TAB Ectopic Multiple Live Births   0 0 0 0 3       Home Medications    Prior to Admission medications   Medication Sig Start Date End Date Taking? Authorizing Provider  busPIRone (BUSPAR) 15 MG tablet Take 15 mg by mouth every evening.    Yes Historical Provider, MD  DULoxetine (CYMBALTA) 60 MG capsule Take 60 mg by mouth at bedtime.    Yes Historical Provider, MD  fluvoxaMINE (LUVOX) 100 MG tablet Take 100 mg by mouth at bedtime.    Yes Historical Provider, MD  glipiZIDE (GLUCOTROL XL) 10 MG 24 hr tablet Take 10 mg by mouth daily with breakfast.   Yes Historical Provider, MD  hydrochlorothiazide (HYDRODIURIL) 25 MG tablet Take 25 mg by mouth daily.   Yes Historical Provider, MD  metFORMIN (GLUCOPHAGE) 500 MG tablet Take 500 mg by mouth 2 (two) times daily with a meal.   Yes Historical Provider, MD  ondansetron (ZOFRAN) 4 MG tablet Take 1 tablet (4 mg total) by mouth every 8 (eight) hours as needed for nausea or vomiting. 04/13/16  Yes Bernadene Bell Wojeck, NP  paliperidone (INVEGA SUSTENNA) 234 MG/1.5ML SUSP injection Inject 234 mg into the muscle every 30 (thirty) days.   Yes Historical Provider, MD  promethazine (PHENERGAN) 25 MG tablet Take 1  tablet (25 mg total) by mouth every 8 (eight) hours as needed for nausea or vomiting. Patient not taking: Reported on 05/02/2016 03/11/16   Quintella Reichert, MD    Family History Family History  Problem Relation Age of Onset  . Hypertension Mother   . Diabetes Father     Social History Social History  Substance Use Topics  . Smoking status: Former Smoker    Packs/day: 1.00    Years: 7.00    Types: Cigarettes    Quit date: 06/30/2015  . Smokeless tobacco: Never Used  . Alcohol use No     Allergies   Omnipaque [iohexol] and Penicillins   Review of Systems Review of Systems  Constitutional: Negative for chills,  diaphoresis, fatigue and fever.  HENT: Positive for congestion and rhinorrhea. Negative for sneezing.   Eyes: Negative.   Respiratory: Negative for cough, chest tightness and shortness of breath.   Cardiovascular: Negative for chest pain and leg swelling.  Gastrointestinal: Negative for abdominal pain, blood in stool, diarrhea, nausea and vomiting.  Genitourinary: Negative for difficulty urinating, flank pain, frequency and hematuria.  Musculoskeletal: Negative for arthralgias and back pain.  Skin: Negative for rash.  Neurological: Negative for dizziness, speech difficulty, weakness, numbness and headaches.  Psychiatric/Behavioral: Positive for suicidal ideas.     Physical Exam Updated Vital Signs BP 141/77 (BP Location: Left Arm)   Pulse 100   Temp 98.2 F (36.8 C) (Oral)   Resp 22   SpO2 98%   Physical Exam  Constitutional: She is oriented to person, place, and time. She appears well-developed and well-nourished.  HENT:  Head: Normocephalic and atraumatic.  Eyes: Pupils are equal, round, and reactive to light.  Neck: Normal range of motion. Neck supple.  Cardiovascular: Normal rate, regular rhythm and normal heart sounds.   Pulmonary/Chest: Effort normal and breath sounds normal. No respiratory distress. She has no wheezes. She has no rales. She exhibits no tenderness.  Abdominal: Soft. Bowel sounds are normal. There is no tenderness. There is no rebound and no guarding.  Musculoskeletal: Normal range of motion. She exhibits no edema.  Lymphadenopathy:    She has no cervical adenopathy.  Neurological: She is alert and oriented to person, place, and time.  Skin: Skin is warm and dry. No rash noted.  Psychiatric: She has a normal mood and affect.     ED Treatments / Results  Labs (all labs ordered are listed, but only abnormal results are displayed) Labs Reviewed  BASIC METABOLIC PANEL - Abnormal; Notable for the following:       Result Value   Glucose, Bld 113 (*)     All other components within normal limits  URINALYSIS, ROUTINE W REFLEX MICROSCOPIC - Abnormal; Notable for the following:    Protein, ur 30 (*)    All other components within normal limits  ACETAMINOPHEN LEVEL - Abnormal; Notable for the following:    Acetaminophen (Tylenol), Serum <10 (*)    All other components within normal limits  URINALYSIS, MICROSCOPIC (REFLEX) - Abnormal; Notable for the following:    Bacteria, UA RARE (*)    Squamous Epithelial / LPF 6-30 (*)    All other components within normal limits  CBG MONITORING, ED - Abnormal; Notable for the following:    Glucose-Capillary 153 (*)    All other components within normal limits  CBC WITH DIFFERENTIAL/PLATELET  RAPID URINE DRUG SCREEN, HOSP PERFORMED  SALICYLATE LEVEL  ETHANOL  I-STAT BETA HCG BLOOD, ED ([facility], [facility], [facility] ONLY)  EKG  EKG Interpretation None       Radiology No results found.  Procedures Procedures (including critical care time)  Medications Ordered in ED Medications  busPIRone (BUSPAR) tablet 15 mg (15 mg Oral Given 05/02/16 2059)  DULoxetine (CYMBALTA) DR capsule 60 mg (60 mg Oral Given 05/02/16 2115)  fluvoxaMINE (LUVOX) tablet 100 mg (100 mg Oral Given 05/02/16 2151)  glipiZIDE (GLUCOTROL XL) 24 hr tablet 10 mg (not administered)  hydrochlorothiazide (HYDRODIURIL) tablet 25 mg (25 mg Oral Given 05/02/16 2150)  metFORMIN (GLUCOPHAGE) tablet 500 mg (not administered)     Initial Impression / Assessment and Plan / ED Course  I have reviewed the triage vital signs and the nursing notes.  Pertinent labs & imaging results that were available during my care of the patient were reviewed by me and considered in my medical decision making (see chart for details).  Clinical Course     PT has been medically cleared.  TTS recommends inpt placement.  Awaiting placement.  Final Clinical Impressions(s) / ED Diagnoses   Final diagnoses:  Depression, unspecified depression type    New Prescriptions New  Prescriptions   No medications on file     Malvin Johns, MD 05/02/16 2306

## 2016-05-02 NOTE — ED Triage Notes (Signed)
Patient arrived with [employer].  They were called by patient who said she was shaving her hair and having suicidal thoughts.  Patient was waiting outside when police  arrived.  Patient reports she started having these thoughts yesterday and the plan is to take an overdose of her medication.  Patient is calm and cooperative and voluntary.

## 2016-05-02 NOTE — ED Notes (Signed)
cbg 153 @ 8:55pm 05/02/2016

## 2016-05-02 NOTE — ED Notes (Signed)
Called pharm for med verification

## 2016-05-02 NOTE — BH Assessment (Addendum)
[employer] contacted via after hours number of [phone] and spoke with Art who stated he will have the caseworker call back to receive the report details provided by the pt.    Update: 21:53 -- report completed  Lind Covert, MSW, LCSWA
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
## 2016-05-03 ENCOUNTER — Encounter (HOSPITAL_COMMUNITY): Payer: Self-pay | Admitting: Registered Nurse

## 2016-05-03 DIAGNOSIS — R45851 Suicidal ideations: Secondary | ICD-10-CM

## 2016-05-03 DIAGNOSIS — F332 Major depressive disorder, recurrent severe without psychotic features: Secondary | ICD-10-CM | POA: Diagnosis not present

## 2016-05-03 DIAGNOSIS — Z9889 Other specified postprocedural states: Secondary | ICD-10-CM

## 2016-05-03 LAB — CBG MONITORING, ED
GLUCOSE-CAPILLARY: 167 mg/dL — AB (ref 65–99)
GLUCOSE-CAPILLARY: 144 mg/dL — AB (ref 65–99)

## 2016-05-03 MED ORDER — HYDROXYZINE HCL 25 MG PO TABS
25.0000 mg | ORAL_TABLET | Freq: Three times a day (TID) | ORAL | Status: DC | PRN
  Administered 2016-05-03: 25 mg via ORAL
  Filled 2016-05-03: qty 1

## 2016-05-03 NOTE — BHH Counselor (Signed)
Pt has been faxed to the following inpt hospitals for possible admission: [facility], [facility], [facility], [facility], [facility], [place].  Lind Covert, MSW, Latanya Presser

## 2016-05-03 NOTE — ED Notes (Signed)
TTS AT BEDSIDE 

## 2016-05-03 NOTE — Consult Note (Signed)
[facility] Consult   Reason for Consult:  Suicidal ideation Referring Physician:  EDP Patient Identification: [name] MRN:  [id number] Principal Diagnosis: MDD (major depressive disorder), recurrent, severe, with psychosis ([place]) Diagnosis:   Patient Active Problem List   Diagnosis Date Noted  . 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 Learning disability [F81.9] 07/18/2015  . 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 Diabetes mellitus ([place]) [E11.9] 07/18/2015  . MDD (major depressive disorder), recurrent, severe, with psychosis ([place]) [F33.3] 07/18/2015  . Hyperlipidemia [E78.5] 07/18/2015  . Drug overdose [T50.901A]   . Cognitive deficits [R41.89] 10/12/2012  . Generalized anxiety disorder [F41.1] 06/28/2012    Total Time spent with patient: 45 minutes  Subjective:   [name] is a 26 y.o. female patient presented [facility] related worsening depression and suicidal thoughts.  HPI:  [name] 26 y.o. female patient seen by Dr. Darleene Cleaver and this provider.  Chart reviewed 05/03/16.   On evaluation:  [name] reports that yesterday she was having suicidal thoughts of overdosing.  States that she has had an worsening of depression last several weeks and shaving and Clorox her head is one of first steps.  History of self mutilation.  Reports that she lives with her mother and outpatient services [facility] and is taking Cymbalta, Buspar, and Luvox Reports that she is compliant with medications but feels that she needs help because depression is getting no better.  At this time patient continues to endorse suicidal ideation and urges of self mutilation.  Able to contract for safety while on floor. Denies homicidal ideation, psychosis, and paranoia.  Lives with her mother.     Past Psychiatric History: Major depressive disorder  Risk to Self: Suicidal Ideation: Yes-Currently Present Suicidal Intent: No Is patient at  risk for suicide?: Yes Suicidal Plan?: Yes-Currently Present Specify Current Suicidal Plan: pt has a plan to OD on her medication Access to Means: Yes Specify Access to Suicidal Means: pt has access to psych meds  What has been your use of drugs/alcohol within the last 12 months?: reports social alcohol use How many times?: 4 Triggers for Past Attempts: Other personal contacts, Other (Comment) (depressed thoughts) Intentional Self Injurious Behavior: Cutting, Damaging Comment - Self Injurious Behavior: pt cuts her hair, bleaches her skin, and has engaged in cutting in the past Risk to Others: Homicidal Ideation: No Thoughts of Harm to Others: No Current Homicidal Intent: No Current Homicidal Plan: No Access to Homicidal Means: No History of harm to others?: No Assessment of Violence: None Noted Does patient have access to weapons?: No Criminal Charges Pending?: No Does patient have a court date: No Prior Inpatient Therapy: Prior Inpatient Therapy: Yes Prior Therapy Dates: 2017, 2016, 2015, 2014, 2012, 2006 Prior Therapy Facilty/Provider(s): [facility] Reason for Treatment: SI, MDD, Bipolar Prior Outpatient Therapy: Prior Outpatient Therapy: Yes Prior Therapy Dates: Ongoing Prior Therapy Facilty/Provider(s): [facility] Reason for Treatment: med management Does patient have an ACCT team?: No Does patient have Intensive In-House Services?  : No Does patient have [facility] services? : No Does patient have [facility] services?: No  Past Medical History:  Past Medical History:  Diagnosis Date  . Anxiety   . Bipolar 1 disorder ([place])   . Cognitive deficits   . Depression   . Diabetes mellitus without complication ([place])   . Hypertension   . Mental disorder   . Obesity     Past Surgical History:  Procedure Laterality Date  . CESAREAN SECTION    . CESAREAN SECTION N/A 04/25/2013   Procedure: REPEAT CESAREAN SECTION;  Surgeon: [name], MD;  Location: [facility];  Service: Obstetrics;  Laterality: N/A;  . MASS EXCISION N/A 06/03/2012   Procedure: EXCISION MASS;  Surgeon: [name], MD;  Location: [facility];  Service: ENT;  Laterality: N/A;  Excision uvula mass  . TONSILLECTOMY N/A 06/03/2012   Procedure: TONSILLECTOMY;  Surgeon: [name], MD;  Location: [facility];  Service: ENT;  Laterality: N/A;  . TONSILLECTOMY     Family History:  Family History  Problem Relation Age of Onset  . Hypertension Mother   . Diabetes Father    Family Psychiatric  History: Denies Social History:  History  Alcohol Use No     History  Drug Use No    Comment: Patient denies    Social History   Social History  . Marital status: Single    Spouse name: N/A  . Number of children: N/A  . Years of education: N/A   Social History Main Topics  . Smoking status: Former Smoker    Packs/day: 1.00    Years: 7.00    Types: Cigarettes    Quit date: 06/30/2015  . Smokeless tobacco: Never Used  . Alcohol use No  . Drug use: No     Comment: Patient denies  . Sexual activity: No   Other Topics Concern  . None   Social History Narrative  . None   Additional Social History:    Allergies:   Allergies  Allergen Reactions  . Omnipaque [Iohexol] Swelling and Other (See Comments)    Reaction:  Eye swelling  . Penicillins Hives and Other (See Comments)    Has patient had a PCN reaction causing immediate rash, facial/tongue/throat swelling, SOB or lightheadedness with hypotension: No Has patient had a PCN reaction causing severe rash involving mucus membranes or skin necrosis: No Has patient had a PCN reaction that required hospitalization No Has patient had a PCN reaction occurring within the last 10 years: No If all of the above answers are "NO", then may proceed with Cephalosporin use.    Labs:  Results for orders placed or performed during the hospital encounter of 05/02/16 (from the  past 48 hour(s))  Urine rapid drug screen (hosp performed)     Status: None   Collection Time: 05/02/16  5:28 PM  Result Value Ref Range   Opiates NONE DETECTED NONE DETECTED   Cocaine NONE DETECTED NONE DETECTED   Benzodiazepines NONE DETECTED NONE DETECTED   Amphetamines NONE DETECTED NONE DETECTED   Tetrahydrocannabinol NONE DETECTED NONE DETECTED   Barbiturates NONE DETECTED NONE DETECTED    Comment:        DRUG SCREEN FOR MEDICAL PURPOSES ONLY.  IF CONFIRMATION IS NEEDED FOR ANY PURPOSE, NOTIFY LAB WITHIN 5 DAYS.        LOWEST DETECTABLE LIMITS FOR URINE DRUG SCREEN Drug Class       Cutoff (ng/mL) Amphetamine      1000 Barbiturate      200 Benzodiazepine   270 Tricyclics       786 Opiates          300 Cocaine          300 THC              50   Urinalysis, Routine w reflex microscopic     Status: Abnormal   Collection Time: 05/02/16  5:29 PM  Result Value Ref Range   Color, Urine YELLOW YELLOW   APPearance CLEAR CLEAR   Specific Gravity, Urine 1.030 1.005 - 1.030   pH 6.0 5.0 - 8.0   Glucose, UA NEGATIVE NEGATIVE mg/dL   Hgb urine dipstick NEGATIVE NEGATIVE   Bilirubin Urine NEGATIVE NEGATIVE   Ketones, ur NEGATIVE NEGATIVE mg/dL   Protein, ur 30 (A) NEGATIVE mg/dL   Nitrite NEGATIVE NEGATIVE   Leukocytes, UA NEGATIVE NEGATIVE  Urinalysis, Microscopic (reflex)     Status: Abnormal   Collection Time: 05/02/16  5:29 PM  Result Value Ref Range   RBC / HPF 0-5 0 - 5 RBC/hpf   WBC, UA 0-5 0 - 5 WBC/hpf   Bacteria, UA RARE (A) NONE SEEN   Squamous Epithelial / LPF 6-30 (A) NONE SEEN  Basic metabolic panel     Status: Abnormal   Collection Time: 05/02/16  5:47 PM  Result Value Ref Range   Sodium 138 135 - 145 mmol/L   Potassium 3.5 3.5 - 5.1 mmol/L   Chloride 101 101 - 111 mmol/L   CO2 29 22 - 32 mmol/L   Glucose, Bld 113 (H) 65 - 99 mg/dL   BUN 12 6 - 20 mg/dL   Creatinine, Ser 0.94 0.44 - 1.00 mg/dL   Calcium 9.1 8.9 - 10.3 mg/dL   GFR calc non Af Amer >60  >60 mL/min   GFR calc Af Amer >60 >60 mL/min    Comment: (NOTE) The eGFR has been calculated using the CKD EPI equation. This calculation has not been validated in all clinical situations. eGFR's persistently <60 mL/min signify possible Chronic Kidney Disease.    Anion gap 8 5 - 15  CBC with Differential     Status: None   Collection Time: 05/02/16  5:47 PM  Result Value Ref Range   WBC 10.3 4.0 - 10.5 K/uL   RBC 4.91 3.87 - 5.11 MIL/uL   Hemoglobin 13.0 12.0 - 15.0 g/dL   HCT 39.9 36.0 - 46.0 %   MCV 81.3 78.0 - 100.0 fL   MCH 26.5 26.0 - 34.0 pg   MCHC 32.6 30.0 - 36.0 g/dL   RDW 14.7 11.5 - 15.5 %   Platelets 246 150 - 400 K/uL   Neutrophils Relative % 56 %   Neutro Abs 5.8 1.7 - 7.7 K/uL   Lymphocytes Relative 32 %   Lymphs Abs 3.3 0.7 - 4.0 K/uL   Monocytes Relative 7 %   Monocytes Absolute 0.7 0.1 - 1.0 K/uL   Eosinophils Relative 4 %   Eosinophils Absolute 0.4 0.0 - 0.7 K/uL   Basophils Relative 1 %   Basophils Absolute 0.1 0.0 - 0.1 K/uL  Salicylate level     Status: None   Collection Time: 05/02/16  5:47 PM  Result Value Ref Range   Salicylate Lvl <2.4 2.8 - 30.0 mg/dL  Ethanol     Status: None   Collection Time: 05/02/16  5:47 PM  Result Value Ref Range   Alcohol, Ethyl (B) <5 <5 mg/dL    Comment:        LOWEST DETECTABLE LIMIT FOR SERUM ALCOHOL IS 5 mg/dL FOR MEDICAL PURPOSES ONLY   Acetaminophen level     Status: Abnormal   Collection Time: 05/02/16  5:47 PM  Result Value Ref Range   Acetaminophen (Tylenol), Serum <10 (L) 10 - 30 ug/mL    Comment:        THERAPEUTIC CONCENTRATIONS VARY SIGNIFICANTLY. A RANGE OF  10-30 ug/mL MAY BE AN EFFECTIVE CONCENTRATION FOR MANY PATIENTS. HOWEVER, SOME ARE BEST TREATED AT CONCENTRATIONS OUTSIDE THIS RANGE. ACETAMINOPHEN CONCENTRATIONS >150 ug/mL AT 4 HOURS AFTER INGESTION AND >50 ug/mL AT 12 HOURS AFTER INGESTION ARE OFTEN ASSOCIATED WITH TOXIC REACTIONS.   I-Stat beta hCG blood, ED     Status: None    Collection Time: 05/02/16  5:51 PM  Result Value Ref Range   I-stat hCG, quantitative <5.0 <5 mIU/mL   Comment 3            Comment:   GEST. AGE      CONC.  (mIU/mL)   <=1 WEEK        5 - 50     2 WEEKS       50 - 500     3 WEEKS       100 - 10,000     4 WEEKS     1,000 - 30,000        FEMALE AND NON-PREGNANT FEMALE:     LESS THAN 5 mIU/mL   CBG monitoring, ED     Status: Abnormal   Collection Time: 05/02/16  8:51 PM  Result Value Ref Range   Glucose-Capillary 153 (H) 65 - 99 mg/dL  CBG monitoring, ED     Status: Abnormal   Collection Time: 05/03/16  6:02 AM  Result Value Ref Range   Glucose-Capillary 167 (H) 65 - 99 mg/dL    Current Facility-Administered Medications  Medication Dose Route Frequency Provider Last Rate Last Dose  . busPIRone (BUSPAR) tablet 15 mg  15 mg Oral QPM Malvin Johns, MD   15 mg at 05/02/16 2059  . DULoxetine (CYMBALTA) DR capsule 60 mg  60 mg Oral QHS Malvin Johns, MD   60 mg at 05/02/16 2115  . fluvoxaMINE (LUVOX) tablet 100 mg  100 mg Oral QHS Malvin Johns, MD   100 mg at 05/02/16 2151  . glipiZIDE (GLUCOTROL XL) 24 hr tablet 10 mg  10 mg Oral Q breakfast Malvin Johns, MD   10 mg at 05/03/16 0829  . hydrochlorothiazide (HYDRODIURIL) tablet 25 mg  25 mg Oral Daily Malvin Johns, MD   25 mg at 05/03/16 0956  . metFORMIN (GLUCOPHAGE) tablet 500 mg  500 mg Oral BID WC Malvin Johns, MD   500 mg at 05/03/16 3846   Current Outpatient Prescriptions  Medication Sig Dispense Refill  . busPIRone (BUSPAR) 15 MG tablet Take 15 mg by mouth every evening.     . DULoxetine (CYMBALTA) 60 MG capsule Take 60 mg by mouth at bedtime.   1  . fluvoxaMINE (LUVOX) 100 MG tablet Take 100 mg by mouth at bedtime.     Marland Kitchen glipiZIDE (GLUCOTROL XL) 10 MG 24 hr tablet Take 10 mg by mouth daily with breakfast.    . hydrochlorothiazide (HYDRODIURIL) 25 MG tablet Take 25 mg by mouth daily.    . metFORMIN (GLUCOPHAGE) 500 MG tablet Take 500 mg by mouth 2 (two) times daily with a meal.     . ondansetron (ZOFRAN) 4 MG tablet Take 1 tablet (4 mg total) by mouth every 8 (eight) hours as needed for nausea or vomiting. 12 tablet 0  . paliperidone (INVEGA SUSTENNA) 234 MG/1.5ML SUSP injection Inject 234 mg into the muscle every 30 (thirty) days.    . promethazine (PHENERGAN) 25 MG tablet Take 1 tablet (25 mg total) by mouth every 8 (eight) hours as needed for nausea or vomiting. (Patient not taking: Reported on 05/02/2016) 10  tablet 0    Musculoskeletal: Strength & Muscle Tone: within normal limits Gait & Station: normal Patient leans: N/A  Psychiatric Specialty Exam: Physical Exam  Constitutional: She is oriented to person, place, and time.  Neck: Normal range of motion.  Respiratory: Effort normal.  Musculoskeletal: Normal range of motion.  Neurological: She is alert and oriented to person, place, and time.    Review of Systems  Constitutional: Negative.   HENT: Negative.   Eyes: Negative.   Respiratory: Negative.   Cardiovascular: Negative.   Gastrointestinal: Negative.   Genitourinary: Negative.   Musculoskeletal: Negative.   Skin: Negative.   Neurological: Negative.   Endo/Heme/Allergies: Negative.   Psychiatric/Behavioral: Positive for depression and suicidal ideas. Negative for hallucinations. The patient is nervous/anxious. The patient does not have insomnia.     Blood pressure 103/68, pulse 105, temperature 98.6 F (37 C), temperature source Oral, resp. rate 20, SpO2 94 %.There is no height or weight on file to calculate BMI.  General Appearance: Fairly Groomed  Eye Contact:  Fair  Speech:  Clear and Coherent and Normal Rate  Volume:  Normal  Mood:  Depressed  Affect:  Depressed  Thought Process:  Goal Directed  Orientation:  Full (Time, Place, and Person)  Thought Content:  Denies hallucinations, delusions, and paranoia  Suicidal Thoughts:  Yes.  with intent/plan  Homicidal Thoughts:  No  Memory:  Immediate;   Good Recent;   Good Remote;   Good   Judgement:  Poor  Insight:  Shallow  Psychomotor Activity:  Normal  Concentration:  Concentration: Fair and Attention Span: Fair  Recall:  Good  Fund of Knowledge:  Fair  Language:  Good  Akathisia:  No  Handed:  Right  AIMS (if indicated):     Assets:  Communication Skills Desire for Improvement  ADL's:  Intact  Cognition:  WNL  Sleep:        Treatment Plan Summary: Daily contact with patient to assess and evaluate symptoms and progress in treatment, Medication management and Plan Inpatient treatment  Disposition: Recommend psychiatric Inpatient admission when medically cleared.  Rankin, Delphia Grates, NP 05/03/2016 4:19 PM  Patient seen face-to-face for psychiatric evaluation, chart reviewed and case discussed with the physician extender and developed treatment plan. Reviewed the information documented and agree with the treatment plan. [name], MD

## 2016-05-04 DIAGNOSIS — Z833 Family history of diabetes mellitus: Secondary | ICD-10-CM

## 2016-05-04 DIAGNOSIS — Z8249 Family history of ischemic heart disease and other diseases of the circulatory system: Secondary | ICD-10-CM

## 2016-05-04 DIAGNOSIS — F639 Impulse disorder, unspecified: Secondary | ICD-10-CM

## 2016-05-04 DIAGNOSIS — F330 Major depressive disorder, recurrent, mild: Secondary | ICD-10-CM | POA: Diagnosis present

## 2016-05-04 DIAGNOSIS — Z880 Allergy status to penicillin: Secondary | ICD-10-CM

## 2016-05-04 DIAGNOSIS — F819 Developmental disorder of scholastic skills, unspecified: Secondary | ICD-10-CM

## 2016-05-04 DIAGNOSIS — Z87891 Personal history of nicotine dependence: Secondary | ICD-10-CM

## 2016-05-04 DIAGNOSIS — E119 Type 2 diabetes mellitus without complications: Secondary | ICD-10-CM | POA: Diagnosis not present

## 2016-05-04 DIAGNOSIS — Z888 Allergy status to other drugs, medicaments and biological substances status: Secondary | ICD-10-CM

## 2016-05-04 DIAGNOSIS — Z79899 Other long term (current) drug therapy: Secondary | ICD-10-CM

## 2016-05-04 MED ORDER — HYDROXYZINE HCL 25 MG PO TABS: 25.0000 mg | ORAL_TABLET | Freq: Three times a day (TID) | ORAL | 0 refills | Status: DC | PRN

## 2016-05-04 NOTE — Discharge Instructions (Signed)
For your ongoing behavioral health needs, you are advised to continue treatment with [facility]: ° °     [facility] °     [address] °     [place], [place] [address] °     [phone] °

## 2016-05-04 NOTE — Consult Note (Signed)
[facility] Consult   Reason for Consult:  Suicidal ideations Referring Physician:  EDP Patient Identification: [name] MRN:  [id number] Principal Diagnosis: Major depressive disorder, recurrent episode, mild ([place]) Diagnosis:   Patient Active Problem List   Diagnosis Date Noted  . Major depressive disorder, recurrent episode, mild ([employer]) [F33.0] 05/04/2016    Priority: High  . Generalized anxiety disorder [F41.1] 06/28/2012    Priority: High  . 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 Learning disability [F81.9] 07/18/2015  . 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 Diabetes mellitus ([place]) [E11.9] 07/18/2015  . MDD (major depressive disorder), recurrent, severe, with psychosis ([place]) [F33.3] 07/18/2015  . Hyperlipidemia [E78.5] 07/18/2015  . Severe episode of recurrent major depressive disorder, without psychotic features ([place]) [F33.2]   . Drug overdose [T50.901A]   . Cognitive deficits [R41.89] 10/12/2012    Total Time spent with patient: 30 minutes  Subjective:   [name] is a 26 y.o. female patient states the "new medicine is helping me."  HPI:  26 yo female who presented to the ED with suicidal ideations and plan to overdose, upset she shaved her head.  She is well known to this ED and providers, low IQ.  Her medications were adjusted and her symptoms resolved.  No suicidal/homicidal ideations, hallucinations, or alcohol/drug abuse.  Stable for discharge.  She goes to [employer] and lives with her mother.  Past Psychiatric History: depression  Risk to Self: None Risk to Others: Homicidal Ideation: No Thoughts of Harm to Others: No Current Homicidal Intent: No Current Homicidal Plan: No Access to Homicidal Means: No History of harm to others?: No Assessment of Violence: None Noted Does patient have access to weapons?: No Criminal Charges Pending?: No Does patient have a court date: No Prior  Inpatient Therapy: Prior Inpatient Therapy: Yes Prior Therapy Dates: 2017, 2016, 2015, 2014, 2012, 2006 Prior Therapy Facilty/Provider(s): [facility], [facility] Reason for Treatment: SI, MDD, Bipolar Prior Outpatient Therapy: Prior Outpatient Therapy: Yes Prior Therapy Dates: Ongoing Prior Therapy Facilty/Provider(s): [facility] Reason for Treatment: med management Does patient have an ACCT team?: No Does patient have Intensive In-House Services?  : No Does patient have [facility] services? : No Does patient have P4CC services?: No  Past Medical History:  Past Medical History:  Diagnosis Date  . Anxiety   . Bipolar 1 disorder ([place])   . Cognitive deficits   . Depression   . Diabetes mellitus without complication ([place])   . Hypertension   . Mental disorder   . Obesity     Past Surgical History:  Procedure Laterality Date  . CESAREAN SECTION    . CESAREAN SECTION N/A 04/25/2013   Procedure: REPEAT CESAREAN SECTION;  Surgeon: [name], MD;  Location: [facility];  Service: Obstetrics;  Laterality: N/A;  . MASS EXCISION N/A 06/03/2012   Procedure: EXCISION MASS;  Surgeon: [name], MD;  Location: [facility];  Service: ENT;  Laterality: N/A;  Excision uvula mass  . TONSILLECTOMY N/A 06/03/2012   Procedure: TONSILLECTOMY;  Surgeon: [name], MD;  Location: [facility];  Service: ENT;  Laterality: N/A;  . TONSILLECTOMY     Family History:  Family History  Problem Relation Age of Onset  . Hypertension Mother   . Diabetes Father    Family Psychiatric  History: none Social History:  History  Alcohol Use No     History  Drug Use No    Comment: Patient  denies    Social History   Social History  . Marital status: Single    Spouse name: N/A  . Number of children: N/A  . Years of education: N/A   Social History Main Topics  . Smoking status: Former Smoker    Packs/day: 1.00    Years: 7.00    Types:  Cigarettes    Quit date: 06/30/2015  . Smokeless tobacco: Never Used  . Alcohol use No  . Drug use: No     Comment: Patient denies  . Sexual activity: No   Other Topics Concern  . None   Social History Narrative  . None   Additional Social History:    Allergies:   Allergies  Allergen Reactions  . Omnipaque [Iohexol] Swelling and Other (See Comments)    Reaction:  Eye swelling  . Penicillins Hives and Other (See Comments)    Has patient had a PCN reaction causing immediate rash, facial/tongue/throat swelling, SOB or lightheadedness with hypotension: No Has patient had a PCN reaction causing severe rash involving mucus membranes or skin necrosis: No Has patient had a PCN reaction that required hospitalization No Has patient had a PCN reaction occurring within the last 10 years: No If all of the above answers are "NO", then may proceed with Cephalosporin use.    Labs:  Results for orders placed or performed during the hospital encounter of 05/02/16 (from the past 48 hour(s))  Urine rapid drug screen (hosp performed)     Status: None   Collection Time: 05/02/16  5:28 PM  Result Value Ref Range   Opiates NONE DETECTED NONE DETECTED   Cocaine NONE DETECTED NONE DETECTED   Benzodiazepines NONE DETECTED NONE DETECTED   Amphetamines NONE DETECTED NONE DETECTED   Tetrahydrocannabinol NONE DETECTED NONE DETECTED   Barbiturates NONE DETECTED NONE DETECTED    Comment:        DRUG SCREEN FOR MEDICAL PURPOSES ONLY.  IF CONFIRMATION IS NEEDED FOR ANY PURPOSE, NOTIFY LAB WITHIN 5 DAYS.        LOWEST DETECTABLE LIMITS FOR URINE DRUG SCREEN Drug Class       Cutoff (ng/mL) Amphetamine      1000 Barbiturate      200 Benzodiazepine   329 Tricyclics       191 Opiates          300 Cocaine          300 THC              50   Urinalysis, Routine w reflex microscopic     Status: Abnormal   Collection Time: 05/02/16  5:29 PM  Result Value Ref Range   Color, Urine YELLOW YELLOW    APPearance CLEAR CLEAR   Specific Gravity, Urine 1.030 1.005 - 1.030   pH 6.0 5.0 - 8.0   Glucose, UA NEGATIVE NEGATIVE mg/dL   Hgb urine dipstick NEGATIVE NEGATIVE   Bilirubin Urine NEGATIVE NEGATIVE   Ketones, ur NEGATIVE NEGATIVE mg/dL   Protein, ur 30 (A) NEGATIVE mg/dL   Nitrite NEGATIVE NEGATIVE   Leukocytes, UA NEGATIVE NEGATIVE  Urinalysis, Microscopic (reflex)     Status: Abnormal   Collection Time: 05/02/16  5:29 PM  Result Value Ref Range   RBC / HPF 0-5 0 - 5 RBC/hpf   WBC, UA 0-5 0 - 5 WBC/hpf   Bacteria, UA RARE (A) NONE SEEN   Squamous Epithelial / LPF 6-30 (A) NONE SEEN  Basic metabolic panel     Status: Abnormal  Collection Time: 05/02/16  5:47 PM  Result Value Ref Range   Sodium 138 135 - 145 mmol/L   Potassium 3.5 3.5 - 5.1 mmol/L   Chloride 101 101 - 111 mmol/L   CO2 29 22 - 32 mmol/L   Glucose, Bld 113 (H) 65 - 99 mg/dL   BUN 12 6 - 20 mg/dL   Creatinine, Ser 0.94 0.44 - 1.00 mg/dL   Calcium 9.1 8.9 - 10.3 mg/dL   GFR calc non Af Amer >60 >60 mL/min   GFR calc Af Amer >60 >60 mL/min    Comment: (NOTE) The eGFR has been calculated using the CKD EPI equation. This calculation has not been validated in all clinical situations. eGFR's persistently <60 mL/min signify possible Chronic Kidney Disease.    Anion gap 8 5 - 15  CBC with Differential     Status: None   Collection Time: 05/02/16  5:47 PM  Result Value Ref Range   WBC 10.3 4.0 - 10.5 K/uL   RBC 4.91 3.87 - 5.11 MIL/uL   Hemoglobin 13.0 12.0 - 15.0 g/dL   HCT 39.9 36.0 - 46.0 %   MCV 81.3 78.0 - 100.0 fL   MCH 26.5 26.0 - 34.0 pg   MCHC 32.6 30.0 - 36.0 g/dL   RDW 14.7 11.5 - 15.5 %   Platelets 246 150 - 400 K/uL   Neutrophils Relative % 56 %   Neutro Abs 5.8 1.7 - 7.7 K/uL   Lymphocytes Relative 32 %   Lymphs Abs 3.3 0.7 - 4.0 K/uL   Monocytes Relative 7 %   Monocytes Absolute 0.7 0.1 - 1.0 K/uL   Eosinophils Relative 4 %   Eosinophils Absolute 0.4 0.0 - 0.7 K/uL   Basophils Relative 1  %   Basophils Absolute 0.1 0.0 - 0.1 K/uL  Salicylate level     Status: None   Collection Time: 05/02/16  5:47 PM  Result Value Ref Range   Salicylate Lvl <8.6 2.8 - 30.0 mg/dL  Ethanol     Status: None   Collection Time: 05/02/16  5:47 PM  Result Value Ref Range   Alcohol, Ethyl (B) <5 <5 mg/dL    Comment:        LOWEST DETECTABLE LIMIT FOR SERUM ALCOHOL IS 5 mg/dL FOR MEDICAL PURPOSES ONLY   Acetaminophen level     Status: Abnormal   Collection Time: 05/02/16  5:47 PM  Result Value Ref Range   Acetaminophen (Tylenol), Serum <10 (L) 10 - 30 ug/mL    Comment:        THERAPEUTIC CONCENTRATIONS VARY SIGNIFICANTLY. A RANGE OF 10-30 ug/mL MAY BE AN EFFECTIVE CONCENTRATION FOR MANY PATIENTS. HOWEVER, SOME ARE BEST TREATED AT CONCENTRATIONS OUTSIDE THIS RANGE. ACETAMINOPHEN CONCENTRATIONS >150 ug/mL AT 4 HOURS AFTER INGESTION AND >50 ug/mL AT 12 HOURS AFTER INGESTION ARE OFTEN ASSOCIATED WITH TOXIC REACTIONS.   I-Stat beta hCG blood, ED     Status: None   Collection Time: 05/02/16  5:51 PM  Result Value Ref Range   I-stat hCG, quantitative <5.0 <5 mIU/mL   Comment 3            Comment:   GEST. AGE      CONC.  (mIU/mL)   <=1 WEEK        5 - 50     2 WEEKS       50 - 500     3 WEEKS       100 - 10,000  4 WEEKS     1,000 - 30,000        FEMALE AND NON-PREGNANT FEMALE:     LESS THAN 5 mIU/mL   CBG monitoring, ED     Status: Abnormal   Collection Time: 05/02/16  8:51 PM  Result Value Ref Range   Glucose-Capillary 153 (H) 65 - 99 mg/dL  CBG monitoring, ED     Status: Abnormal   Collection Time: 05/03/16  6:02 AM  Result Value Ref Range   Glucose-Capillary 167 (H) 65 - 99 mg/dL  CBG monitoring, ED     Status: Abnormal   Collection Time: 05/03/16  5:04 PM  Result Value Ref Range   Glucose-Capillary 144 (H) 65 - 99 mg/dL   Comment 1 Notify RN     Current Facility-Administered Medications  Medication Dose Route Frequency Provider Last Rate Last Dose  . busPIRone  (BUSPAR) tablet 15 mg  15 mg Oral QPM Malvin Johns, MD   15 mg at 05/03/16 1823  . DULoxetine (CYMBALTA) DR capsule 60 mg  60 mg Oral QHS Malvin Johns, MD   60 mg at 05/03/16 2234  . fluvoxaMINE (LUVOX) tablet 100 mg  100 mg Oral QHS Malvin Johns, MD   100 mg at 05/03/16 2234  . glipiZIDE (GLUCOTROL XL) 24 hr tablet 10 mg  10 mg Oral Q breakfast Malvin Johns, MD   10 mg at 05/04/16 0845  . hydrochlorothiazide (HYDRODIURIL) tablet 25 mg  25 mg Oral Daily Malvin Johns, MD   25 mg at 05/03/16 0956  . hydrOXYzine (ATARAX/VISTARIL) tablet 25 mg  25 mg Oral TID PRN Quintella Reichert, MD   25 mg at 05/03/16 1757  . metFORMIN (GLUCOPHAGE) tablet 500 mg  500 mg Oral BID WC Malvin Johns, MD   500 mg at 05/04/16 0845   Current Outpatient Prescriptions  Medication Sig Dispense Refill  . busPIRone (BUSPAR) 15 MG tablet Take 15 mg by mouth every evening.     . DULoxetine (CYMBALTA) 60 MG capsule Take 60 mg by mouth at bedtime.   1  . fluvoxaMINE (LUVOX) 100 MG tablet Take 100 mg by mouth at bedtime.     Marland Kitchen glipiZIDE (GLUCOTROL XL) 10 MG 24 hr tablet Take 10 mg by mouth daily with breakfast.    . hydrochlorothiazide (HYDRODIURIL) 25 MG tablet Take 25 mg by mouth daily.    . metFORMIN (GLUCOPHAGE) 500 MG tablet Take 500 mg by mouth 2 (two) times daily with a meal.    . ondansetron (ZOFRAN) 4 MG tablet Take 1 tablet (4 mg total) by mouth every 8 (eight) hours as needed for nausea or vomiting. 12 tablet 0  . paliperidone (INVEGA SUSTENNA) 234 MG/1.5ML SUSP injection Inject 234 mg into the muscle every 30 (thirty) days.    . promethazine (PHENERGAN) 25 MG tablet Take 1 tablet (25 mg total) by mouth every 8 (eight) hours as needed for nausea or vomiting. (Patient not taking: Reported on 05/02/2016) 10 tablet 0    Musculoskeletal: Strength & Muscle Tone: within normal limits Gait & Station: normal Patient leans: N/A  Psychiatric Specialty Exam: Physical Exam  Constitutional: She is oriented to person, place,  and time. She appears well-developed and well-nourished.  HENT:  Head: Normocephalic.  Neck: Normal range of motion.  Respiratory: Effort normal.  Musculoskeletal: Normal range of motion.  Neurological: She is alert and oriented to person, place, and time.  Psychiatric: She has a normal mood and affect. Her speech is normal and behavior is normal.  Judgment and thought content normal. Cognition and memory are normal.    Review of Systems  All other systems reviewed and are negative.   Blood pressure 101/71, pulse 109, temperature 97.9 F (36.6 C), temperature source Oral, resp. rate 19, SpO2 96 %.There is no height or weight on file to calculate BMI.  General Appearance: Casual  Eye Contact:  Good  Speech:  Normal Rate  Volume:  Normal  Mood:  Euthymic  Affect:  Congruent  Thought Process:  Coherent and Descriptions of Associations: Intact  Orientation:  Full (Time, Place, and Person)  Thought Content:  WDL and Logical  Suicidal Thoughts:  No  Homicidal Thoughts:  No  Memory:  Immediate;   Good Recent;   Good Remote;   Good  Judgement:  Fair  Insight:  Fair  Psychomotor Activity:  Normal  Concentration:  Concentration: Good and Attention Span: Good  Recall:  Good  Fund of Knowledge:  Fair  Language:  Good  Akathisia:  No  Handed:  Right  AIMS (if indicated):     Assets:  Housing Leisure Time Physical Health Resilience Social Support  ADL's:  Intact  Cognition:  WNL  Sleep:        Treatment Plan Summary: Daily contact with patient to assess and evaluate symptoms and progress in treatment, Medication management and Plan Major depressive disorder, recurrent episode, mild ([facility])  -Crisis stabilization -Medication management:  Continued medical medications along with Luvox 100 mg daily for anxiety, Vistaril 25 mg TID PRN anxiety, Buspar 15 mg daily for anxiety, Cymbalta 60 mg daily for depression -Individual counseling -Rx provided  Disposition: No evidence of  imminent risk to self or others at present.    Waylan Boga, NP 05/04/2016 10:44 AM  Patient seen face-to-face for psychiatric evaluation, chart reviewed and case discussed with the physician extender and developed treatment plan. Reviewed the information documented and agree with the treatment plan. [name], MD

## 2016-05-04 NOTE — ED Notes (Signed)
Patient is A & O x4.  She understood discharge instructions. 

## 2016-05-04 NOTE — BH Assessment (Signed)
[facility] Assessment Progress Note  Per [name], MD, this pt does not require psychiatric hospitalization at this time.  Pt is to be discharged from [facility] with recommendation to continue treatment with [facility], her current outpatient provider.  This has been included in pt's discharge instructions.  Pt's nurse has been notified.  Jalene Mullet, [facility] Triage Specialist [phone]

## 2016-05-05 NOTE — [facility] Suicide Risk Assessment (Signed)
Suicide Risk Assessment  Discharge Assessment   [facility] Discharge Suicide Risk Assessment   Principal Problem: Major depressive disorder, recurrent episode, mild ([place]) Discharge Diagnoses:  Patient Active Problem List   Diagnosis Date Noted  . Major depressive disorder, recurrent episode, mild ([place]) [F33.0] 05/04/2016    Priority: High  . Generalized anxiety disorder [F41.1] 06/28/2012    Priority: High  . 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 Learning disability [F81.9] 07/18/2015  . 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 Diabetes mellitus ([place]) [E11.9] 07/18/2015  . MDD (major depressive disorder), recurrent, severe, with psychosis ([place]) [F33.3] 07/18/2015  . Hyperlipidemia [E78.5] 07/18/2015  . Severe episode of recurrent major depressive disorder, without psychotic features ([place]) [F33.2]   . Drug overdose [T50.901A]   . Cognitive deficits [R41.89] 10/12/2012    Total Time spent with patient: 45 minutes  Musculoskeletal: Strength & Muscle Tone: within normal limits Gait & Station: normal Patient leans: N/A  Psychiatric Specialty Exam: Physical Exam  Constitutional: She is oriented to person, place, and time. She appears well-developed and well-nourished.  HENT:  Head: Normocephalic.  Neck: Normal range of motion.  Respiratory: Effort normal.  Musculoskeletal: Normal range of motion.  Neurological: She is alert and oriented to person, place, and time.  Psychiatric: She has a normal mood and affect. Her speech is normal and behavior is normal. Judgment and thought content normal. Cognition and memory are normal.    Review of Systems  All other systems reviewed and are negative.   Blood pressure 101/71, pulse 109, temperature 97.9 F (36.6 C), temperature source Oral, resp. rate 19, SpO2 96 %.There is no height or weight on file to calculate BMI.  General Appearance: Casual  Eye Contact:  Good  Speech:  Normal Rate  Volume:  Normal  Mood:   Euthymic  Affect:  Congruent  Thought Process:  Coherent and Descriptions of Associations: Intact  Orientation:  Full (Time, Place, and Person)  Thought Content:  WDL and Logical  Suicidal Thoughts:  No  Homicidal Thoughts:  No  Memory:  Immediate;   Good Recent;   Good Remote;   Good  Judgement:  Fair  Insight:  Fair  Psychomotor Activity:  Normal  Concentration:  Concentration: Good and Attention Span: Good  Recall:  Good  Fund of Knowledge:  Fair  Language:  Good  Akathisia:  No  Handed:  Right  AIMS (if indicated):     Assets:  Housing Leisure Time Physical Health Resilience Social Support  ADL's:  Intact  Cognition:  WNL  Sleep:       Mental Status Per Nursing Assessment::   On Admission:   suicidal with plan to overdose  Demographic Factors:  Adolescent or young adult  Loss Factors: NA  Historical Factors: Impulsivity  Risk Reduction Factors:   Sense of responsibility to family, Living with another person, especially a relative, Positive social support and Positive therapeutic relationship  Continued Clinical Symptoms:  None  Cognitive Features That Contribute To Risk:  None    Suicide Risk:  Minimal: No identifiable suicidal ideation.  Patients presenting with no risk factors but with morbid ruminations; may be classified as minimal risk based on the severity of the depressive symptoms    Plan Of Care/Follow-up recommendations:  Activity:  as tolerated Diet:  heart healthy diet  Damonique Brunelle, NP 05/05/2016, 8:50 AM

## 2016-05-08 ENCOUNTER — Ambulatory Visit (INDEPENDENT_AMBULATORY_CARE_PROVIDER_SITE_OTHER): Payer: Medicaid Other | Admitting: Nurse Practitioner

## 2016-05-08 ENCOUNTER — Encounter: Payer: Self-pay | Admitting: Nurse Practitioner

## 2016-05-08 VITALS — BP 126/94 | HR 80 | Ht 67.0 in | Wt 269.4 lb

## 2016-05-08 DIAGNOSIS — F3289 Other specified depressive episodes: Secondary | ICD-10-CM

## 2016-05-08 DIAGNOSIS — K625 Hemorrhage of anus and rectum: Secondary | ICD-10-CM

## 2016-05-08 DIAGNOSIS — R1110 Vomiting, unspecified: Secondary | ICD-10-CM | POA: Diagnosis not present

## 2016-05-08 MED ORDER — HYDROCORTISONE 2.5 % RE CREA: TOPICAL_CREAM | RECTAL | 1 refills | Status: DC

## 2016-05-08 NOTE — Progress Notes (Addendum)
HPI:  Patient is a 26 year old female with a history of DM 2 , obesity, chronic abdominal pain, anxiety , and major depression with suicidal ideation. She is here for evaluation of recent rectal bleeding. Two weeks ago she had 2 episodes of bright red blood with bowel movements.  She was not constipated and had not strained. No previous history of rectal bleeding. No history of hemorrhoids. Patient's stools tend to be on the looser side 1-3 times a day in frequency. Her bowel habits haven't changed.  Over the last year and a half patient states she has been to the ED about 30 times for stabbing lower abdominal pain. The pain is almost constant, not related to eating, unrelieved with bowel movements.   [name] complains of vaginal bleeding with intercourse. Believes her last pap smear was just a few months ago but not really sure.     Past Medical History:  Diagnosis Date  . Anxiety   . Bipolar 1 disorder ([place])   . Cognitive deficits   . Depression   . Diabetes mellitus without complication ([place])   . Hypertension   . Mental disorder   . Obesity     Past Surgical History:  Procedure Laterality Date  . CESAREAN SECTION    . CESAREAN SECTION N/A 04/25/2013   Procedure: REPEAT CESAREAN SECTION;  Surgeon: [name], MD;  Location: [facility];  Service: Obstetrics;  Laterality: N/A;  . MASS EXCISION N/A 06/03/2012   Procedure: EXCISION MASS;  Surgeon: [name], MD;  Location: [facility];  Service: ENT;  Laterality: N/A;  Excision uvula mass  . TONSILLECTOMY N/A 06/03/2012   Procedure: TONSILLECTOMY;  Surgeon: [name], MD;  Location: [facility];  Service: ENT;  Laterality: N/A;  . TONSILLECTOMY     Family History  Problem Relation Age of Onset  . Hypertension Mother   . Diabetes Father    Social History  Substance Use Topics  . Smoking status: Former Smoker    Packs/day: 1.00    Years: 7.00    Types: Cigarettes    Quit date:  06/30/2015  . Smokeless tobacco: Never Used  . Alcohol use No    Allergies  Allergen Reactions  . Omnipaque [Iohexol] Swelling and Other (See Comments)    Reaction:  Eye swelling  . Penicillins Hives and Other (See Comments)    Has patient had a PCN reaction causing immediate rash, facial/tongue/throat swelling, SOB or lightheadedness with hypotension: No Has patient had a PCN reaction causing severe rash involving mucus membranes or skin necrosis: No Has patient had a PCN reaction that required hospitalization No Has patient had a PCN reaction occurring within the last 10 years: No If all of the above answers are "NO", then may proceed with Cephalosporin use.     Review of Systems: Positive for anxiety, back pain, depression, itching, sleeping problems and swelling of feet and legs. All other systems reviewed and negative except where noted in HPI.   Physical Exam: BP (!) 126/94   Pulse 80   Ht 5\' 7"  (1.702 m)   Wt 269 lb 6.4 oz (122.2 kg)   BMI 42.19 kg/m  Constitutional:  Morbidly obese black female in no acute distress. Psychiatric: Normal mood and affect. Behavior is normal. HEENT: Normocephalic and atraumatic. Conjunctivae are normal. No scleral icterus. Neck supple.  Cardiovascular: Normal rate, regular rhythm.  Pulmonary/chest: Effort normal and breath sounds normal. No wheezing, rales or rhonchi. Abdominal: Obese . Abdomen  is soft, bowel sounds active throughout. There are no masses palpable. She is significantly tender over right edge of c-section scar. Able to reproduce the pain.  Rectal: A few mildly inflamed external hemorrhoids. On anoscopy, in left lateral decubitus there was an inflamed left lateral hemorrhoid with pinpoint area of bleeding.  Extremities: no edema Lymphadenopathy: No cervical adenopathy noted. Neurological: Alert and oriented to person place and time. Skin: Skin is warm and dry. No rashes noted.   ASSESSMENT AND PLAN:  85. 26 year-old female  with two episodes of painless rectal bleeding with BM several days ago.  Evaluated in ED at the time, hgb stable at 12. She was advised to see GI. On anoscopic exam there is an inflamed left lateral hemorrhoid with pinpoint oozing.  - Will try a course of Hydrocortisone cream 2.5% nightly. Administration instructions reviewed with patient and also   with friend who came with her.  -Initially I asked patient to call me in 2 weeks with a condition update but we instead decided on a follow up visit  with me in a couple of weeks     2. Chronic lower abdominal pain. She is quite tender over right edge of her well healed C-section scar. Palpation of the area reproduces her pain. At this point there is no evidence of intra-abdominal etiology of pain.   3. Vaginal bleeding with intercourse. Advised her to see GYN or whomever does her GYN exams.   4. Severe depression with hx of suicide ideation. In ED again on 05/02/16 with thoughts of killing herself. Today she tells me that she doesn't feel depressed. She is on several psychiatric medications.   5. Morbid obesity  6. DM2   Tye Savoy, NP  05/08/2016, 1:38 PM   Addendum : after completion of our visit patient asked Medical Assistant why she has frequent vomiting. Patient didn't mention this during our visit. She has been seen in ED for this in the past. Suspect underlying functional component but will evaluate further at follow up visit .She has Zofran to use as needed.   Benito Mccreedy, MD   Thank you for sending this case to me. I have reviewed the entire note, and the outlined plan seems appropriate.  It seems most likely to be functional abdominal pain with hemorrhoidal bleeding.

## 2016-05-08 NOTE — Patient Instructions (Addendum)
We sent a prescription to [facility] for Anusol Hydrocortisone 2.5 % . If this is too expensive, get the Maximum strength Preperation H hemorrhoid cream.   Use at bedtime for 7 nights.  Put some cream on your finger and insert it in your rectum up to the first knuckle.  Call us at [phone] and ask for Kathee Delton nurse to let us know how your are doing. Call us sooner if you bleed again.   We did make you an appointment to see Nevin Bloodgood on 05-26-2016 at 1:30 PM.

## 2016-05-24 ENCOUNTER — Ambulatory Visit (HOSPITAL_BASED_OUTPATIENT_CLINIC_OR_DEPARTMENT_OTHER): Payer: Medicaid Other | Attending: Physician Assistant

## 2016-05-26 ENCOUNTER — Ambulatory Visit: Payer: Self-pay | Admitting: Nurse Practitioner

## 2016-06-03 ENCOUNTER — Other Ambulatory Visit: Payer: Self-pay | Admitting: Nurse Practitioner

## 2016-06-23 ENCOUNTER — Emergency Department (HOSPITAL_COMMUNITY)
Admission: EM | Admit: 2016-06-23 | Discharge: 2016-06-23 | Disposition: A | Discharge status: Home / Self Care | Payer: Medicaid Other | Attending: Physician Assistant | Admitting: Physician Assistant

## 2016-06-23 ENCOUNTER — Encounter (HOSPITAL_COMMUNITY): Payer: Self-pay | Admitting: *Deleted

## 2016-06-23 DIAGNOSIS — I10 Essential (primary) hypertension: Secondary | ICD-10-CM | POA: Insufficient documentation

## 2016-06-23 DIAGNOSIS — Z87891 Personal history of nicotine dependence: Secondary | ICD-10-CM | POA: Diagnosis not present

## 2016-06-23 DIAGNOSIS — M542 Cervicalgia: Secondary | ICD-10-CM | POA: Insufficient documentation

## 2016-06-23 DIAGNOSIS — E119 Type 2 diabetes mellitus without complications: Secondary | ICD-10-CM | POA: Diagnosis not present

## 2016-06-23 DIAGNOSIS — Z7984 Long term (current) use of oral hypoglycemic drugs: Secondary | ICD-10-CM | POA: Diagnosis not present

## 2016-06-23 MED ORDER — IBUPROFEN 600 MG PO TABS: 600.0000 mg | ORAL_TABLET | Freq: Four times a day (QID) | ORAL | 0 refills | Status: DC | PRN

## 2016-06-23 MED ORDER — LIDOCAINE 5 % EX PTCH: 1.0000 | MEDICATED_PATCH | CUTANEOUS | 0 refills | Status: DC

## 2016-06-23 MED ORDER — METHOCARBAMOL 500 MG PO TABS: 500.0000 mg | ORAL_TABLET | Freq: Two times a day (BID) | ORAL | 0 refills | Status: DC

## 2016-06-23 NOTE — ED Triage Notes (Signed)
Patient brought in by [facility] for right sided neck pain that started when she woke up. Patient was ambulatory with EMS from truck to lobby.  Patient vitals 130/90, 90HR, CBG 209

## 2016-06-23 NOTE — Discharge Instructions (Signed)
Expect your soreness to increase over the next 2-3 days. Take it easy, but do not lay around too much as this may make any stiffness worse.  Antiinflammatory medications: Take 500 mg of naproxen every 12 hours or 800 mg of ibuprofen every 8 hours for the next 3 days. Take these medications with food to avoid upset stomach. Choose only one of these medications, do not take them together.  Muscle relaxer: Robaxin is a muscle relaxer and may help loosen stiff muscles. Do not take the Robaxin while driving or performing other dangerous activities.   Lidocaine patches: These are available via either prescription or over-the-counter. The over-the-counter option may be more economical one and are likely just as effective. There are multiple over-the-counter brands, such as Salonpas.  Exercises: Be sure to perform the attached exercises starting with three times a week and working up to performing them daily. This is an essential part of preventing long term problems. Noted that the information sheet regarding "Cervical Strain and Sprain" was included for the sake of the exercises. You are not being diagnosed with this issue.   Follow up with a primary care provider for any future management of these complaints.

## 2016-06-23 NOTE — ED Provider Notes (Signed)
[facility] DEPT Provider Note   CSN: [id number] Arrival date & time: 06/23/16  [id number]  By signing my name below, I, Neta Mends, attest that this documentation has been prepared under the direction and in the presence of Judit Awad, PA-C. Electronically Signed: Neta Mends, ED Scribe. 06/23/2016. 6:47 PM.    History   Chief Complaint Chief Complaint  Patient presents with  . Neck Pain    The history is provided by the patient. No language interpreter was used.   HPI Comments:  [name] is a 26 y.o. female who presents to the Emergency Department complaining of constant neck pain on the right side that began this morning. Pt states that she first noticed the pain when she woke up. Pain is mild, described as a tightness, nonradiating. Pt reports previous, similar pain several years ago. Pt took tylenol with no relief. Denies neuro deficits, falls/trauma, or any other complaints.     Past Medical History:  Diagnosis Date  . Anxiety   . Bipolar 1 disorder ([place])   . Cognitive deficits   . Depression   . Diabetes mellitus without complication ([place])   . Hypertension   . Mental disorder   . Obesity     Patient Active Problem List   Diagnosis Date Noted  . Major depressive disorder, recurrent episode, mild ([place]) 05/04/2016  . 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 Learning disability 07/18/2015  . 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 Diabetes mellitus ([place]) 07/18/2015  . 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 Hyperlipidemia 07/18/2015  . Severe episode of recurrent major depressive disorder, without psychotic features ([place])   . Drug overdose   . Cognitive deficits 10/12/2012  . Generalized anxiety disorder 06/28/2012    Past Surgical History:  Procedure Laterality Date  . CESAREAN SECTION    . CESAREAN SECTION N/A 04/25/2013   Procedure: REPEAT CESAREAN SECTION;  Surgeon: [name],  MD;  Location: [facility];  Service: Obstetrics;  Laterality: N/A;  . MASS EXCISION N/A 06/03/2012   Procedure: EXCISION MASS;  Surgeon: [name], MD;  Location: [facility];  Service: ENT;  Laterality: N/A;  Excision uvula mass  . TONSILLECTOMY N/A 06/03/2012   Procedure: TONSILLECTOMY;  Surgeon: [name], MD;  Location: [facility];  Service: ENT;  Laterality: N/A;  . TONSILLECTOMY      OB History    Gravida Para Term Preterm AB Living   3 3 3  0 0 3   SAB TAB Ectopic Multiple Live Births   0 0 0 0 3       Home Medications    Prior to Admission medications   Medication Sig Start Date End Date Taking? Authorizing Provider  busPIRone (BUSPAR) 15 MG tablet Take 15 mg by mouth every evening.     Historical Provider, MD  DULoxetine (CYMBALTA) 60 MG capsule Take 60 mg by mouth at bedtime.     Historical Provider, MD  fluvoxaMINE (LUVOX) 100 MG tablet Take 100 mg by mouth at bedtime.     Historical Provider, MD  glipiZIDE (GLUCOTROL XL) 10 MG 24 hr tablet Take 10 mg by mouth daily with breakfast.    Historical Provider, MD  hydrochlorothiazide (HYDRODIURIL) 25 MG tablet Take 25 mg by mouth daily.    Historical Provider, MD  hydrocortisone (ANUSOL-HC) 2.5 % rectal cream Use 1 application rectally at bedtime. 05/08/16   Willia Craze, NP  hydrOXYzine (ATARAX/VISTARIL) 25 MG tablet Take  1 tablet (25 mg total) by mouth 3 (three) times daily as needed for anxiety. 05/04/16   Patrecia Pour, NP  ibuprofen (ADVIL,MOTRIN) 600 MG tablet Take 1 tablet (600 mg total) by mouth every 6 (six) hours as needed. 06/23/16   Raynold Blankenbaker C Karriem Muench, PA-C  lidocaine (LIDODERM) 5 % Place 1 patch onto the skin daily. Remove & Discard patch within 12 hours or as directed by MD 06/23/16   Lorayne Bender, PA-C  metFORMIN (GLUCOPHAGE) 500 MG tablet Take 500 mg by mouth 2 (two) times daily with a meal.    Historical Provider, MD  methocarbamol (ROBAXIN) 500 MG tablet Take 1 tablet (500 mg total) by  mouth 2 (two) times daily. 06/23/16   Greenley Martone C Avagrace Botelho, PA-C  ondansetron (ZOFRAN) 4 MG tablet Take 1 tablet (4 mg total) by mouth every 8 (eight) hours as needed for nausea or vomiting. 04/13/16   Ulice Bold, NP  paliperidone (INVEGA SUSTENNA) 234 MG/1.5ML SUSP injection Inject 234 mg into the muscle every 30 (thirty) days.    Historical Provider, MD  promethazine (PHENERGAN) 25 MG tablet Take 1 tablet (25 mg total) by mouth every 8 (eight) hours as needed for nausea or vomiting. 03/11/16   Quintella Reichert, MD    Family History Family History  Problem Relation Age of Onset  . Hypertension Mother   . Diabetes Father     Social History Social History  Substance Use Topics  . Smoking status: Former Smoker    Packs/day: 1.00    Years: 7.00    Types: Cigarettes    Quit date: 06/30/2015  . Smokeless tobacco: Never Used  . Alcohol use No     Allergies   Omnipaque [iohexol] and Penicillins   Review of Systems Review of Systems  Musculoskeletal: Positive for neck pain. Negative for back pain.  Neurological: Negative for weakness and numbness.     Physical Exam Updated Vital Signs BP 133/98 (BP Location: Right Arm)   Pulse 114   Temp 98.2 F (36.8 C) (Oral)   Resp 16   SpO2 100%   Physical Exam  Constitutional: She appears well-developed and well-nourished. No distress.  HENT:  Head: Normocephalic and atraumatic.  Eyes: Conjunctivae are normal.  Neck: Normal range of motion. Neck supple.  Cardiovascular: Normal rate, regular rhythm and intact distal pulses.   Patient was not tachycardic upon my assessment.  Pulmonary/Chest: Effort normal.  Musculoskeletal: She exhibits tenderness.  Tenderness to right trapezius, rotating her head to the right elicits increased pain. Full ROM of right shoulder without pain, crepitus, or other abnormality.  Lymphadenopathy:    She has no cervical adenopathy.  Neurological: She is alert.  No sensory deficits in the upper extremities. Grip  strengths 5 out of 5 bilaterally.  Skin: Skin is warm and dry. She is not diaphoretic.  Psychiatric: She has a normal mood and affect. Her behavior is normal.  Nursing note and vitals reviewed.    ED Treatments / Results  DIAGNOSTIC STUDIES:  Oxygen Saturation is 100% on RA, normal by my interpretation.    COORDINATION OF CARE:     Labs (all labs ordered are listed, but only abnormal results are displayed) Labs Reviewed - No data to display  EKG  EKG Interpretation None       Radiology No results found.  Procedures Procedures (including critical care time)  Medications Ordered in ED Medications - No data to display   Initial Impression / Assessment and Plan / ED Course  I have reviewed the triage vital signs and the nursing notes.  Pertinent labs & imaging results that were available during my care of the patient were reviewed by me and considered in my medical decision making (see chart for details).     Patient presents with unilateral pain in the trapezius muscle upon waking. No neuro or functional deficits. Home care and return precautions discussed. Patient voices understanding of all instructions and is comfortable with discharge.     Final Clinical Impressions(s) / ED Diagnoses   Final diagnoses:  Neck pain    New Prescriptions New Prescriptions   IBUPROFEN (ADVIL,MOTRIN) 600 MG TABLET    Take 1 tablet (600 mg total) by mouth every 6 (six) hours as needed.   LIDOCAINE (LIDODERM) 5 %    Place 1 patch onto the skin daily. Remove & Discard patch within 12 hours or as directed by MD   METHOCARBAMOL (ROBAXIN) 500 MG TABLET    Take 1 tablet (500 mg total) by mouth 2 (two) times daily.   I personally performed the services described in this documentation, which was scribed in my presence. The recorded information has been reviewed and is accurate.   Lorayne Bender, PA-C 06/23/16 [place], MD 06/24/16 2322

## 2016-06-23 NOTE — ED Triage Notes (Signed)
Pt reports waking up this am with R neck pain.  Denies injury.  Pt ambulatory without difficulty.  Pt able to move her head to the left without pain, reports pain when moving her head to the right.

## 2016-06-25 ENCOUNTER — Encounter (HOSPITAL_COMMUNITY): Payer: Self-pay | Admitting: Emergency Medicine

## 2016-06-25 ENCOUNTER — Emergency Department (HOSPITAL_COMMUNITY)
Admission: EM | Admit: 2016-06-25 | Discharge: 2016-06-25 | Disposition: A | Discharge status: Home / Self Care | Payer: Medicaid Other | Attending: Emergency Medicine | Admitting: Emergency Medicine

## 2016-06-25 DIAGNOSIS — Z7984 Long term (current) use of oral hypoglycemic drugs: Secondary | ICD-10-CM | POA: Insufficient documentation

## 2016-06-25 DIAGNOSIS — Z87891 Personal history of nicotine dependence: Secondary | ICD-10-CM | POA: Diagnosis not present

## 2016-06-25 DIAGNOSIS — Z79899 Other long term (current) drug therapy: Secondary | ICD-10-CM | POA: Insufficient documentation

## 2016-06-25 DIAGNOSIS — L989 Disorder of the skin and subcutaneous tissue, unspecified: Secondary | ICD-10-CM | POA: Diagnosis not present

## 2016-06-25 DIAGNOSIS — E119 Type 2 diabetes mellitus without complications: Secondary | ICD-10-CM | POA: Diagnosis not present

## 2016-06-25 DIAGNOSIS — I10 Essential (primary) hypertension: Secondary | ICD-10-CM | POA: Insufficient documentation

## 2016-06-25 DIAGNOSIS — L299 Pruritus, unspecified: Secondary | ICD-10-CM | POA: Diagnosis present

## 2016-06-25 DIAGNOSIS — R238 Other skin changes: Secondary | ICD-10-CM

## 2016-06-25 MED ORDER — DIPHENHYDRAMINE HCL 25 MG PO TABS: 50.0000 mg | ORAL_TABLET | ORAL | 0 refills | Status: DC | PRN

## 2016-06-25 MED ORDER — DIPHENHYDRAMINE HCL 25 MG PO CAPS
50.0000 mg | ORAL_CAPSULE | Freq: Once | ORAL | Status: AC
  Administered 2016-06-25: 50 mg via ORAL
  Filled 2016-06-25: qty 2

## 2016-06-25 NOTE — Discharge Instructions (Signed)
Take benadryl as needed for itch.  Avoid new clothes that can cause skin irritation.  Follow up with your doctor for further care.  Return if you develop tongue swelling, trouble breathing, or if you have other concerns.

## 2016-06-25 NOTE — ED Notes (Signed)
Pt states she is due for her Invega shot and sometimes has these symptoms when she does not have it and wants to know if the doctor can give her that today.

## 2016-06-25 NOTE — ED Provider Notes (Signed)
[facility] DEPT Provider Note   CSN: [id number] Arrival date & time: 06/25/16  2050  By signing my name below, I, Collene Leyden, attest that this documentation has been prepared under the direction and in the presence of Domenic Moras.  Electronically Signed: Collene Leyden, Scribe. 06/25/16. 9:55 PM.  History   Chief Complaint Chief Complaint  Patient presents with  . Tingling    HPI Comments: [name] is a 26 y.o. female who presents to the Emergency Department complaining of generalized itching that began 2 days ago. Patient states the itching began at the hands and arms and spread to the legs and face. Patient states she has had the same symptoms in the past, when her inveha injection was due. Patient also states the itching could possibly be due to laundry detergent, as she bought a new shirt from [place]. Patient reports associated tingling to the areas. No modifying factors indicated. Patient denies any trouble breathing, trouble swallowing, abdominal pain,  new recent soap detergent, medications, or pets.   The history is provided by the patient. No language interpreter was used.    Past Medical History:  Diagnosis Date  . Anxiety   . Bipolar 1 disorder ([place])   . Cognitive deficits   . Depression   . Diabetes mellitus without complication ([place])   . Hypertension   . Mental disorder   . Obesity     Patient Active Problem List   Diagnosis Date Noted  . Major depressive disorder, recurrent episode, mild ([place]) 05/04/2016  . 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 Learning disability 07/18/2015  . 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 Diabetes mellitus ([place]) 07/18/2015  . 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 Hyperlipidemia 07/18/2015  . Severe episode of recurrent major depressive disorder, without psychotic features ([place])   . Drug overdose   . Cognitive deficits 10/12/2012  . Generalized anxiety  disorder 06/28/2012    Past Surgical History:  Procedure Laterality Date  . CESAREAN SECTION    . CESAREAN SECTION N/A 04/25/2013   Procedure: REPEAT CESAREAN SECTION;  Surgeon: [name], MD;  Location: [facility];  Service: Obstetrics;  Laterality: N/A;  . MASS EXCISION N/A 06/03/2012   Procedure: EXCISION MASS;  Surgeon: [name], MD;  Location: [facility];  Service: ENT;  Laterality: N/A;  Excision uvula mass  . TONSILLECTOMY N/A 06/03/2012   Procedure: TONSILLECTOMY;  Surgeon: [name], MD;  Location: [facility];  Service: ENT;  Laterality: N/A;  . TONSILLECTOMY      OB History    Gravida Para Term Preterm AB Living   3 3 3  0 0 3   SAB TAB Ectopic Multiple Live Births   0 0 0 0 3       Home Medications    Prior to Admission medications   Medication Sig Start Date End Date Taking? Authorizing Provider  busPIRone (BUSPAR) 15 MG tablet Take 15 mg by mouth every evening.     Historical Provider, MD  DULoxetine (CYMBALTA) 60 MG capsule Take 60 mg by mouth at bedtime.     Historical Provider, MD  fluvoxaMINE (LUVOX) 100 MG tablet Take 100 mg by mouth at bedtime.     Historical Provider, MD  glipiZIDE (GLUCOTROL XL) 10 MG 24 hr tablet Take 10 mg by mouth daily with breakfast.    Historical Provider, MD  hydrochlorothiazide (HYDRODIURIL) 25 MG tablet Take 25 mg by mouth daily.    Historical Provider,  MD  hydrocortisone (ANUSOL-HC) 2.5 % rectal cream Use 1 application rectally at bedtime. 05/08/16   Willia Craze, NP  hydrOXYzine (ATARAX/VISTARIL) 25 MG tablet Take 1 tablet (25 mg total) by mouth 3 (three) times daily as needed for anxiety. 05/04/16   Patrecia Pour, NP  ibuprofen (ADVIL,MOTRIN) 600 MG tablet Take 1 tablet (600 mg total) by mouth every 6 (six) hours as needed. 06/23/16   Shawn C Joy, PA-C  lidocaine (LIDODERM) 5 % Place 1 patch onto the skin daily. Remove & Discard patch within 12 hours or as directed by MD 06/23/16   Lorayne Bender, PA-C  metFORMIN (GLUCOPHAGE) 500 MG tablet Take 500 mg by mouth 2 (two) times daily with a meal.    Historical Provider, MD  methocarbamol (ROBAXIN) 500 MG tablet Take 1 tablet (500 mg total) by mouth 2 (two) times daily. 06/23/16   Shawn C Joy, PA-C  ondansetron (ZOFRAN) 4 MG tablet Take 1 tablet (4 mg total) by mouth every 8 (eight) hours as needed for nausea or vomiting. 04/13/16   Ulice Bold, NP  paliperidone (INVEGA SUSTENNA) 234 MG/1.5ML SUSP injection Inject 234 mg into the muscle every 30 (thirty) days.    Historical Provider, MD  promethazine (PHENERGAN) 25 MG tablet Take 1 tablet (25 mg total) by mouth every 8 (eight) hours as needed for nausea or vomiting. 03/11/16   Quintella Reichert, MD    Family History Family History  Problem Relation Age of Onset  . Hypertension Mother   . Diabetes Father     Social History Social History  Substance Use Topics  . Smoking status: Former Smoker    Packs/day: 1.00    Years: 7.00    Types: Cigarettes    Quit date: 06/30/2015  . Smokeless tobacco: Never Used  . Alcohol use No     Allergies   Omnipaque [iohexol] and Penicillins   Review of Systems Review of Systems  HENT: Negative for trouble swallowing.   Respiratory: Negative for shortness of breath.   Gastrointestinal: Negative for abdominal pain.  Skin: Negative for color change and rash.       Itching to the face, hands, arms, and legs.      Physical Exam Updated Vital Signs BP (!) 143/107 (BP Location: Left Arm)   Pulse 107   Temp 98.4 F (36.9 C) (Oral)   Resp 19   Ht 5\' 7"  (1.702 m)   Wt 260 lb (117.9 kg)   SpO2 99%   BMI 40.72 kg/m   Physical Exam  Constitutional: She is oriented to person, place, and time. She appears well-developed.  HENT:  Head: Normocephalic and atraumatic.  Mouth/Throat: Oropharynx is clear and moist.  No tongue swelling.   Eyes: Conjunctivae and EOM are normal. Pupils are equal, round, and reactive to light.  Neck: Normal range  of motion. Neck supple.  Cardiovascular: Normal rate.   Pulmonary/Chest: Effort normal.  Abdominal: Soft. Bowel sounds are normal.  Musculoskeletal: Normal range of motion.  Neurological: She is alert and oriented to person, place, and time.  Skin: Skin is warm and dry. No rash noted.  Psychiatric: She has a normal mood and affect.     ED Treatments / Results  DIAGNOSTIC STUDIES: Oxygen Saturation is 99% on RA, normal by my interpretation.    COORDINATION OF CARE: 9:54 PM Discussed treatment plan with pt at bedside and pt agreed to plan, which includes benadryl.   Labs (all labs ordered are listed, but only  abnormal results are displayed) Labs Reviewed - No data to display  EKG  EKG Interpretation None       Radiology No results found.  Procedures Procedures (including critical care time)  Medications Ordered in ED Medications  diphenhydrAMINE (BENADRYL) capsule 50 mg (50 mg Oral Given 06/25/16 2223)     Initial Impression / Assessment and Plan / ED Course  I have reviewed the triage vital signs and the nursing notes.  Pertinent labs & imaging results that were available during my care of the patient were reviewed by me and considered in my medical decision making (see chart for details).     BP (!) 143/107 (BP Location: Left Arm)   Pulse 107   Temp 98.4 F (36.9 C) (Oral)   Resp 19   Ht 5\' 7"  (1.702 m)   Wt 117.9 kg   SpO2 99%   BMI 40.72 kg/m    Final Clinical Impressions(s) / ED Diagnoses   Final diagnoses:  Skin irritation    New Prescriptions New Prescriptions   No medications on file   I personally performed the services described in this documentation, which was scribed in my presence. The recorded information has been reviewed and is accurate.   Pt here with c/o itchiness and allergy sxs to her skin x 2 days.  No concerning rash on exam.  Suspect possible skin irritation from new clothing she bought from [place].  She is NVI, doubt  peripheral neuropathy or other acute emergent medical condition or electrolytes derangement. Will provide benadryl to use as needed.  outpt f/u recommend, return precaution discussed.     Domenic Moras, PA-C 06/25/16 2226    Blanchie Dessert, MD 06/26/16 [phone]

## 2016-06-25 NOTE — ED Triage Notes (Signed)
Pt comes from home via EMS with complaints of "pins and needles" feeling all over her body for the past 2 days.  Last time this occurred it was anxiety/nerves related.  Ambulatory to triage.  Hx of diabetes and HTN. CBG 140. BP 160/80, HR 90. No other complaints at this time.  Neuro intact.

## 2016-06-27 ENCOUNTER — Encounter (HOSPITAL_COMMUNITY): Payer: Self-pay | Admitting: *Deleted

## 2016-06-27 ENCOUNTER — Emergency Department (HOSPITAL_COMMUNITY)
Admission: EM | Admit: 2016-06-27 | Discharge: 2016-06-28 | Disposition: A | Discharge status: Home / Self Care | Payer: Medicaid Other | Attending: Emergency Medicine | Admitting: Emergency Medicine

## 2016-06-27 DIAGNOSIS — Z87891 Personal history of nicotine dependence: Secondary | ICD-10-CM | POA: Insufficient documentation

## 2016-06-27 DIAGNOSIS — R202 Paresthesia of skin: Secondary | ICD-10-CM | POA: Diagnosis not present

## 2016-06-27 DIAGNOSIS — I10 Essential (primary) hypertension: Secondary | ICD-10-CM | POA: Insufficient documentation

## 2016-06-27 DIAGNOSIS — Z79899 Other long term (current) drug therapy: Secondary | ICD-10-CM | POA: Diagnosis not present

## 2016-06-27 DIAGNOSIS — L299 Pruritus, unspecified: Secondary | ICD-10-CM | POA: Diagnosis not present

## 2016-06-27 DIAGNOSIS — E119 Type 2 diabetes mellitus without complications: Secondary | ICD-10-CM | POA: Diagnosis not present

## 2016-06-27 DIAGNOSIS — Z7984 Long term (current) use of oral hypoglycemic drugs: Secondary | ICD-10-CM | POA: Insufficient documentation

## 2016-06-27 LAB — COMPREHENSIVE METABOLIC PANEL
ALK PHOS: 40 U/L (ref 38–126)
ALT: 34 U/L (ref 14–54)
ANION GAP: 11 (ref 5–15)
AST: 41 U/L (ref 15–41)
Albumin: 4.5 g/dL (ref 3.5–5.0)
BILIRUBIN TOTAL: 0.6 mg/dL (ref 0.3–1.2)
BUN: 19 mg/dL (ref 6–20)
CALCIUM: 9.6 mg/dL (ref 8.9–10.3)
CO2: 30 mmol/L (ref 22–32)
Chloride: 102 mmol/L (ref 101–111)
Creatinine, Ser: 0.86 mg/dL (ref 0.44–1.00)
GFR calc Af Amer: >60 mL/min (ref >60)
Glucose, Bld: 107 mg/dL — ABNORMAL HIGH (ref 65–99)
POTASSIUM: 3 mmol/L — AB (ref 3.5–5.1)
Sodium: 143 mmol/L (ref 135–145)
TOTAL PROTEIN: 7.8 g/dL (ref 6.5–8.1)

## 2016-06-27 LAB — URINALYSIS, ROUTINE W REFLEX MICROSCOPIC
Bacteria, UA: NONE SEEN
Bilirubin Urine: NEGATIVE
Glucose, UA: NEGATIVE mg/dL
Hgb urine dipstick: NEGATIVE
Ketones, ur: 5 mg/dL — AB
Leukocytes, UA: NEGATIVE
Nitrite: NEGATIVE
Protein, ur: >=300 mg/dL — AB
Specific Gravity, Urine: 1.027 (ref 1.005–1.030)
pH: 5 (ref 5.0–8.0)

## 2016-06-27 LAB — CBC WITH DIFFERENTIAL/PLATELET
Basophils Absolute: 0.1 K/uL (ref 0.0–0.1)
Basophils Relative: 1 %
Eosinophils Absolute: 0.4 K/uL (ref 0.0–0.7)
Eosinophils Relative: 3 %
HCT: 38 % (ref 36.0–46.0)
Hemoglobin: 12.6 g/dL (ref 12.0–15.0)
Lymphocytes Relative: 34 %
Lymphs Abs: 4 K/uL (ref 0.7–4.0)
MCH: 26.5 pg (ref 26.0–34.0)
MCHC: 33.2 g/dL (ref 30.0–36.0)
MCV: 80 fL (ref 78.0–100.0)
Monocytes Absolute: 0.6 K/uL (ref 0.1–1.0)
Monocytes Relative: 5 %
Neutro Abs: 6.6 K/uL (ref 1.7–7.7)
Neutrophils Relative %: 57 %
Platelets: 224 K/uL (ref 150–400)
RBC: 4.75 MIL/uL (ref 3.87–5.11)
RDW: 14.5 % (ref 11.5–15.5)
WBC: 11.5 K/uL — ABNORMAL HIGH (ref 4.0–10.5)

## 2016-06-27 LAB — I-STAT BETA HCG BLOOD, ED (MC, WL, AP ONLY): I-stat hCG, quantitative: <5 m[IU]/mL (ref <5)

## 2016-06-27 MED ORDER — HYDROXYZINE HCL 25 MG PO TABS: 25.0000 mg | ORAL_TABLET | Freq: Four times a day (QID) | ORAL | 0 refills | Status: DC

## 2016-06-27 MED ORDER — PALIPERIDONE PALMITATE 234 MG/1.5ML IM SUSP: 234.0000 mg | INTRAMUSCULAR | Status: DC

## 2016-06-27 MED ORDER — HYDROXYZINE HCL 25 MG PO TABS
25.0000 mg | ORAL_TABLET | Freq: Once | ORAL | Status: AC
  Administered 2016-06-28: 25 mg via ORAL
  Filled 2016-06-27: qty 1

## 2016-06-27 NOTE — Discharge Instructions (Signed)
Contact a health care provider if: °The itching does not go away after several days. °You sweat at night. °You have weight loss. °You are unusually thirsty. °You urinate more than normal. °You are more tired than normal. °You have abdominal pain. °Your skin tingles. °You feel weak. °Your skin or the whites of your eyes look yellow (jaundice). °Your skin feels numb. °

## 2016-06-27 NOTE — ED Provider Notes (Signed)
[facility] DEPT Provider Note   CSN: [id number] Arrival date & time: 06/27/16  2217 By signing my name below, I, [name], attest that this documentation has been prepared under the direction and in the presence of non-physician practitioner, Stacy Mail, PA-C. Electronically Signed: Dyke [name], Scribe. 06/27/2016. 11:44 PM.   History   Chief Complaint Chief Complaint  Patient presents with  . Tingling   HPI [name] is a 26 y.o. female with a history of anxiety, Bipolar 1 disorder, DM, HTN and cognitive deficits who presents to the Emergency Department complaining of a diffuse, persistent tingling sensation onset 4 days ago. She describes this a a mildly painful pins and needles sensation. Pt states this is is from missing 2 months of her Invega injection. She has taken Benadryl and Tylenol with no relief. No modifying factors noted. Pt feels as if she received the shot, her symptoms would resolve. She denies any rash, SOB, trouble swallowing or any other associated symptoms.   The history is provided by the patient. No language interpreter was used.    Past Medical History:  Diagnosis Date  . Anxiety   . Bipolar 1 disorder ([place])   . Cognitive deficits   . Depression   . Diabetes mellitus without complication ([place])   . Hypertension   . Mental disorder   . Obesity    Patient Active Problem List   Diagnosis Date Noted  . Major depressive disorder, recurrent episode, mild ([name]) 05/04/2016  . 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 Learning disability 07/18/2015  . 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 Diabetes mellitus ([place]) 07/18/2015  . MDD (major depressive disorder), recurrent, severe, with psychosis ([name]) 07/18/2015  . Hyperlipidemia 07/18/2015  . Severe episode of recurrent major depressive disorder, without psychotic features ([place])   . Drug overdose   . Cognitive deficits 10/12/2012  . Generalized anxiety disorder 06/28/2012   Past  Surgical History:  Procedure Laterality Date  . CESAREAN SECTION    . CESAREAN SECTION N/A 04/25/2013   Procedure: REPEAT CESAREAN SECTION;  Surgeon: [name], MD;  Location: [facility];  Service: Obstetrics;  Laterality: N/A;  . MASS EXCISION N/A 06/03/2012   Procedure: EXCISION MASS;  Surgeon: [name], MD;  Location: [facility];  Service: ENT;  Laterality: N/A;  Excision uvula mass  . TONSILLECTOMY N/A 06/03/2012   Procedure: TONSILLECTOMY;  Surgeon: [name], MD;  Location: [facility];  Service: ENT;  Laterality: N/A;  . TONSILLECTOMY      OB History    Gravida Para Term Preterm AB Living   3 3 3  0 0 3   SAB TAB Ectopic Multiple Live Births   0 0 0 0 3      Home Medications    Prior to Admission medications   Medication Sig Start Date End Date Taking? Authorizing Provider  busPIRone (BUSPAR) 15 MG tablet Take 15 mg by mouth every evening.     Historical Provider, MD  diphenhydrAMINE (BENADRYL) 25 MG tablet Take 2 tablets (50 mg total) by mouth every 4 (four) hours as needed for itching. 06/25/16   Domenic Moras, PA-C  DULoxetine (CYMBALTA) 60 MG capsule Take 60 mg by mouth at bedtime.     Historical Provider, MD  fluvoxaMINE (LUVOX) 100 MG tablet Take 100 mg by mouth at bedtime.     Historical Provider, MD  glipiZIDE (GLUCOTROL XL) 10 MG 24 hr tablet Take 10 mg by mouth daily with breakfast.    Historical  Provider, MD  hydrochlorothiazide (HYDRODIURIL) 25 MG tablet Take 25 mg by mouth daily.    Historical Provider, MD  hydrocortisone (ANUSOL-HC) 2.5 % rectal cream Use 1 application rectally at bedtime. 05/08/16   Willia Craze, NP  hydrOXYzine (ATARAX/VISTARIL) 25 MG tablet Take 1 tablet (25 mg total) by mouth 3 (three) times daily as needed for anxiety. 05/04/16   Patrecia Pour, NP  ibuprofen (ADVIL,MOTRIN) 600 MG tablet Take 1 tablet (600 mg total) by mouth every 6 (six) hours as needed. 06/23/16   Shawn C Joy, PA-C  lidocaine (LIDODERM) 5 %  Place 1 patch onto the skin daily. Remove & Discard patch within 12 hours or as directed by MD 06/23/16   Lorayne Bender, PA-C  metFORMIN (GLUCOPHAGE) 500 MG tablet Take 500 mg by mouth 2 (two) times daily with a meal.    Historical Provider, MD  methocarbamol (ROBAXIN) 500 MG tablet Take 1 tablet (500 mg total) by mouth 2 (two) times daily. 06/23/16   Shawn C Joy, PA-C  ondansetron (ZOFRAN) 4 MG tablet Take 1 tablet (4 mg total) by mouth every 8 (eight) hours as needed for nausea or vomiting. 04/13/16   Ulice Bold, NP  paliperidone (INVEGA SUSTENNA) 234 MG/1.5ML SUSP injection Inject 234 mg into the muscle every 30 (thirty) days.    Historical Provider, MD  promethazine (PHENERGAN) 25 MG tablet Take 1 tablet (25 mg total) by mouth every 8 (eight) hours as needed for nausea or vomiting. 03/11/16   Quintella Reichert, MD    Family History Family History  Problem Relation Age of Onset  . Hypertension Mother   . Diabetes Father     Social History Social History  Substance Use Topics  . Smoking status: Former Smoker    Packs/day: 1.00    Years: 7.00    Types: Cigarettes    Quit date: 06/30/2015  . Smokeless tobacco: Never Used  . Alcohol use No    Allergies   Omnipaque [iohexol] and Penicillins  Review of Systems Review of Systems  HENT: Negative for trouble swallowing.   Respiratory: Negative for shortness of breath.   Skin: Negative for rash.  Neurological:       +tingling  All other systems reviewed and are negative.  Physical Exam Updated Vital Signs BP 132/81 (BP Location: Left Arm)   Pulse 113   Temp 98.4 F (36.9 C) (Oral)   Resp 20   SpO2 95%   Physical Exam  Constitutional: She is oriented to person, place, and time. She appears well-developed and well-nourished. No distress.  HENT:  Head: Normocephalic and atraumatic.  Eyes: Conjunctivae and EOM are normal. Pupils are equal, round, and reactive to light. No scleral icterus.  Neck: Normal range of motion.    Cardiovascular: Normal rate, regular rhythm and normal heart sounds.  Exam reveals no gallop and no friction rub.   No murmur heard. Pulmonary/Chest: Effort normal and breath sounds normal. No respiratory distress. She has no wheezes. She has no rales. She exhibits no tenderness.  Abdominal: Soft. Bowel sounds are normal. She exhibits no distension and no mass. There is no tenderness. There is no guarding.  Neurological: She is alert and oriented to person, place, and time.  Skin: Skin is warm and dry. She is not diaphoretic.  Psychiatric: She has a normal mood and affect. Her behavior is normal.  Nursing note and vitals reviewed.   ED Treatments / Results  DIAGNOSTIC STUDIES:  Oxygen Saturation is 95% on RA,  adequate by my interpretation.    COORDINATION OF CARE:  11:14 PM Discussed treatment plan with pt at bedside and pt agreed to plan.   Labs (all labs ordered are listed, but only abnormal results are displayed) Labs Reviewed  COMPREHENSIVE METABOLIC PANEL - Abnormal; Notable for the following:       Result Value   Potassium 3.0 (*)    Glucose, Bld 107 (*)    All other components within normal limits  CBC WITH DIFFERENTIAL/PLATELET - Abnormal; Notable for the following:    WBC 11.5 (*)    All other components within normal limits  URINALYSIS, ROUTINE W REFLEX MICROSCOPIC - Abnormal; Notable for the following:    APPearance HAZY (*)    Ketones, ur 5 (*)    Protein, ur >=300 (*)    Squamous Epithelial / LPF 0-5 (*)    All other components within normal limits  I-STAT BETA HCG BLOOD, ED ([facility], [facility], [facility] ONLY)    EKG  EKG Interpretation None       Radiology No results found.  Procedures Procedures (including critical care time)  Medications Ordered in ED Medications - No data to display   Initial Impression / Assessment and Plan / ED Course  I have reviewed the triage vital signs and the nursing notes.  Pertinent labs & imaging results that were available  during my care of the patient were reviewed by me and considered in my medical decision making (see chart for details).     Patient here for itching which she attributes to her Not having her take a shot for the last 2 months. Patient would like her Invega shot today. I did attempt to give it to her. However , we do not carry invega here. Patient will be discharged with hydroxyzine. I have encouraged her to follow up with [facility] for [facility] shot, which is coming up on this Wednesday, 07/01/2016. Patient agrees with plan of care and appears safe for discharge at this time. No evidence of transaminitis, nonicteric. Final Clinical Impressions(s) / ED Diagnoses   Final diagnoses:  Tingling  Itching    New Prescriptions New Prescriptions   No medications on file  I personally performed the services described in this documentation, which was scribed in my presence. The recorded information has been reviewed and is accurate.        Stacy Mail, PA-C 06/27/16 2356    April Palumbo, MD 06/28/16 [phone]

## 2016-06-27 NOTE — ED Notes (Signed)
Bed: [id number] Expected date:  Expected time:  Means of arrival:  Comments: 26 yo f pins and needles, medication reaction

## 2016-06-27 NOTE — ED Triage Notes (Signed)
Per EMS, pt reports pins and needles, itching for the past 4 days. Pt was seen for same 2 days ago and was given benadryl without improvement. Pt states her symptoms are from not receiving her invega injection.

## 2016-07-10 ENCOUNTER — Emergency Department (HOSPITAL_COMMUNITY)
Admission: EM | Admit: 2016-07-10 | Discharge: 2016-07-10 | Disposition: A | Discharge status: Home / Self Care | Payer: Medicaid Other | Attending: Emergency Medicine | Admitting: Emergency Medicine

## 2016-07-10 ENCOUNTER — Encounter (HOSPITAL_COMMUNITY): Payer: Self-pay

## 2016-07-10 DIAGNOSIS — I10 Essential (primary) hypertension: Secondary | ICD-10-CM | POA: Diagnosis not present

## 2016-07-10 DIAGNOSIS — Z5321 Procedure and treatment not carried out due to patient leaving prior to being seen by health care provider: Secondary | ICD-10-CM | POA: Diagnosis not present

## 2016-07-10 DIAGNOSIS — Z7984 Long term (current) use of oral hypoglycemic drugs: Secondary | ICD-10-CM | POA: Diagnosis not present

## 2016-07-10 DIAGNOSIS — R300 Dysuria: Secondary | ICD-10-CM | POA: Insufficient documentation

## 2016-07-10 DIAGNOSIS — Z87891 Personal history of nicotine dependence: Secondary | ICD-10-CM | POA: Insufficient documentation

## 2016-07-10 DIAGNOSIS — E119 Type 2 diabetes mellitus without complications: Secondary | ICD-10-CM | POA: Diagnosis not present

## 2016-07-10 LAB — I-STAT CHEM 8, ED
BUN: 19 mg/dL (ref 6–20)
CALCIUM ION: 1.15 mmol/L (ref 1.15–1.40)
CHLORIDE: 98 mmol/L — AB (ref 101–111)
Creatinine, Ser: 0.9 mg/dL (ref 0.44–1.00)
GLUCOSE: 192 mg/dL — AB (ref 65–99)
HCT: 41 % (ref 36.0–46.0)
Hemoglobin: 13.9 g/dL (ref 12.0–15.0)
Potassium: 3.4 mmol/L — ABNORMAL LOW (ref 3.5–5.1)
Sodium: 142 mmol/L (ref 135–145)
TCO2: 29 mmol/L (ref 0–100)

## 2016-07-10 NOTE — ED Notes (Signed)
Pt called no answer 

## 2016-07-10 NOTE — ED Notes (Signed)
No answer from lobby  

## 2016-07-10 NOTE — ED Triage Notes (Signed)
Per EMS, pt from home.  Pt c/o painful urination x 3 hours.  Itching, rash.  Vitals:  148/90, hr 90, resp 18

## 2016-07-13 ENCOUNTER — Emergency Department (HOSPITAL_COMMUNITY)
Admission: EM | Admit: 2016-07-13 | Discharge: 2016-07-13 | Disposition: A | Discharge status: Home / Self Care | Payer: Medicaid Other | Attending: Emergency Medicine | Admitting: Emergency Medicine

## 2016-07-13 ENCOUNTER — Encounter (HOSPITAL_COMMUNITY): Payer: Self-pay | Admitting: Emergency Medicine

## 2016-07-13 DIAGNOSIS — Z79899 Other long term (current) drug therapy: Secondary | ICD-10-CM | POA: Insufficient documentation

## 2016-07-13 DIAGNOSIS — K644 Residual hemorrhoidal skin tags: Secondary | ICD-10-CM | POA: Diagnosis not present

## 2016-07-13 DIAGNOSIS — I10 Essential (primary) hypertension: Secondary | ICD-10-CM | POA: Diagnosis not present

## 2016-07-13 DIAGNOSIS — N898 Other specified noninflammatory disorders of vagina: Secondary | ICD-10-CM | POA: Diagnosis present

## 2016-07-13 DIAGNOSIS — N760 Acute vaginitis: Secondary | ICD-10-CM | POA: Diagnosis not present

## 2016-07-13 DIAGNOSIS — Z7984 Long term (current) use of oral hypoglycemic drugs: Secondary | ICD-10-CM | POA: Diagnosis not present

## 2016-07-13 DIAGNOSIS — Z87891 Personal history of nicotine dependence: Secondary | ICD-10-CM | POA: Diagnosis not present

## 2016-07-13 DIAGNOSIS — E119 Type 2 diabetes mellitus without complications: Secondary | ICD-10-CM | POA: Insufficient documentation

## 2016-07-13 LAB — URINALYSIS, ROUTINE W REFLEX MICROSCOPIC
BILIRUBIN URINE: NEGATIVE
GLUCOSE, UA: NEGATIVE mg/dL
HGB URINE DIPSTICK: NEGATIVE
Ketones, ur: NEGATIVE mg/dL
Leukocytes, UA: NEGATIVE
NITRITE: NEGATIVE
PROTEIN: NEGATIVE mg/dL
SPECIFIC GRAVITY, URINE: 1.024 (ref 1.005–1.030)
pH: 7 (ref 5.0–8.0)

## 2016-07-13 LAB — WET PREP, GENITAL
Clue Cells Wet Prep HPF POC: NONE SEEN
Sperm: NONE SEEN
Trich, Wet Prep: NONE SEEN
Yeast Wet Prep HPF POC: NONE SEEN

## 2016-07-13 LAB — POC URINE PREG, ED: Preg Test, Ur: NEGATIVE

## 2016-07-13 MED ORDER — AZITHROMYCIN 250 MG PO TABS
1000.0000 mg | ORAL_TABLET | Freq: Once | ORAL | Status: AC
  Administered 2016-07-13: 1000 mg via ORAL
  Filled 2016-07-13: qty 4

## 2016-07-13 MED ORDER — LIDOCAINE HCL 1 % IJ SOLN
INTRAMUSCULAR | Status: AC
  Administered 2016-07-13: 0.9 mL
  Filled 2016-07-13: qty 20

## 2016-07-13 MED ORDER — CEFTRIAXONE SODIUM 250 MG IJ SOLR
250.0000 mg | Freq: Once | INTRAMUSCULAR | Status: AC
  Administered 2016-07-13: 250 mg via INTRAMUSCULAR
  Filled 2016-07-13: qty 250

## 2016-07-13 NOTE — Discharge Instructions (Signed)
Follow up with your doctor in regards to today's visit.   Take a stool softener such as colace daily as needed for constipation. Increase hydration. Please return to the ER for worsening symptoms, high fevers or persistent vomiting.  You have been tested for chlamydia and gonorrhea. These results will be available in approximately 3 days. You will be notified if they are positive.   SEEK IMMEDIATE MEDICAL CARE IF:  You develop an oral temperature above 102 F (38.9 C), not controlled by medications or lasting more than 2 days.  You develop an increase in pain.

## 2016-07-13 NOTE — ED Triage Notes (Signed)
Pt reports vaginal itching and burning for the past 3 days.  Also reports ongoing hemorrhoid pain.

## 2016-07-13 NOTE — ED Provider Notes (Signed)
[facility] DEPT Provider Note   CSN: [id number] Arrival date & time: 07/13/16  0950     History   Chief Complaint Chief Complaint  Patient presents with  . Vaginal Itching  . Hemorrhoids    HPI [name] is a 26 y.o. female.  The history is provided by the patient and medical records. No language interpreter was used.  Vaginal Itching  Associated symptoms include abdominal pain (Chronic, no change). Pertinent negatives include no chest pain, no headaches and no shortness of breath.  [name] is a 26 y.o. female  with a PMH of HTN, bipolar, DM who presents to the Emergency Department complaining of persistent vaginal itching x 4 days. Associated symptoms include dysuria and brown vaginal discharge. Has had a yeast infection in the past and this feels similar, therefore she tried topical OTC yeast cream with little relief. LMP 3 years ago - birth control implant. No back pain. No fevers, chills, urinary urgency or frequency. She has chronic abdominal pain, but no increase in usual pain.   Patient additionally complaining of Persistent hemorrhoids for the last several months. She has tried a topical cream which sounds like hydrocortisone cream with little relief. She does endorse constipation and has not tried any remedies for constipation. No blood in the stools. Patient states hemorrhoids are only painful with bowel movements or if she walks for a very long time. She has seen her regular doctor about this as well, but unsure of what they informed her to do.     Past Medical History:  Diagnosis Date  . Anxiety   . Bipolar 1 disorder ([place])   . Cognitive deficits   . Depression   . Diabetes mellitus without complication ([place])   . Hypertension   . Mental disorder   . Obesity     Patient Active Problem List   Diagnosis Date Noted  . Major depressive disorder, recurrent episode, mild ([place]) 05/04/2016  . 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  . 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 Diabetes mellitus ([place]) 07/18/2015  . MDD (major depressive disorder), recurrent, severe, with psychosis ([place]) 07/18/2015  . Hyperlipidemia 07/18/2015  . Severe episode of recurrent major depressive disorder, without psychotic features ([place])   . Drug overdose   . Cognitive deficits 10/12/2012  . Generalized anxiety disorder 06/28/2012    Past Surgical History:  Procedure Laterality Date  . CESAREAN SECTION    . CESAREAN SECTION N/A 04/25/2013   Procedure: REPEAT CESAREAN SECTION;  Surgeon: [name], MD;  Location: [facility];  Service: Obstetrics;  Laterality: N/A;  . MASS EXCISION N/A 06/03/2012   Procedure: EXCISION MASS;  Surgeon: [name], MD;  Location: [facility];  Service: ENT;  Laterality: N/A;  Excision uvula mass  . TONSILLECTOMY N/A 06/03/2012   Procedure: TONSILLECTOMY;  Surgeon: [name], MD;  Location: [facility];  Service: ENT;  Laterality: N/A;  . TONSILLECTOMY      OB History    Gravida Para Term Preterm AB Living   3 3 3  0 0 3   SAB TAB Ectopic Multiple Live Births   0 0 0 0 3       Home Medications    Prior to Admission medications   Medication Sig Start Date End Date Taking? Authorizing Provider  acetaminophen (TYLENOL) 500 MG tablet Take 500 mg by mouth every 6 (six) hours as needed for mild pain or headache.   Yes Historical Provider,  MD  busPIRone (BUSPAR) 15 MG tablet Take 15 mg by mouth every evening.    Yes Historical Provider, MD  DULoxetine (CYMBALTA) 60 MG capsule Take 60 mg by mouth at bedtime.    Yes Historical Provider, MD  fluvoxaMINE (LUVOX) 100 MG tablet Take 100 mg by mouth at bedtime.    Yes Historical Provider, MD  glipiZIDE (GLUCOTROL XL) 10 MG 24 hr tablet Take 10 mg by mouth every evening.    Yes Historical Provider, MD  hydrochlorothiazide (HYDRODIURIL) 25 MG tablet Take 25 mg by mouth every evening.    Yes Historical Provider, MD   hydrOXYzine (ATARAX/VISTARIL) 25 MG tablet Take 1 tablet (25 mg total) by mouth 3 (three) times daily as needed for anxiety. 05/04/16  Yes Patrecia Pour, NP  metFORMIN (GLUCOPHAGE) 500 MG tablet Take 500 mg by mouth 2 (two) times daily with a meal.   Yes Historical Provider, MD  ondansetron (ZOFRAN) 4 MG tablet Take 1 tablet (4 mg total) by mouth every 8 (eight) hours as needed for nausea or vomiting. 04/13/16  Yes Ulice Bold, NP  paliperidone (INVEGA SUSTENNA) 234 MG/1.5ML SUSP injection Inject 234 mg into the muscle every 30 (thirty) days.   Yes Historical Provider, MD  diphenhydrAMINE (BENADRYL) 25 MG tablet Take 2 tablets (50 mg total) by mouth every 4 (four) hours as needed for itching. Patient not taking: Reported on 07/13/2016 06/25/16   Domenic Moras, PA-C  hydrocortisone (ANUSOL-HC) 2.5 % rectal cream Use 1 application rectally at bedtime. Patient not taking: Reported on 07/13/2016 05/08/16   Willia Craze, NP  hydrOXYzine (ATARAX/VISTARIL) 25 MG tablet Take 1 tablet (25 mg total) by mouth every 6 (six) hours. Patient not taking: Reported on 07/13/2016 06/27/16   Margarita Mail, PA-C  ibuprofen (ADVIL,MOTRIN) 600 MG tablet Take 1 tablet (600 mg total) by mouth every 6 (six) hours as needed. Patient not taking: Reported on 07/13/2016 06/23/16   Shawn C Joy, PA-C  lidocaine (LIDODERM) 5 % Place 1 patch onto the skin daily. Remove & Discard patch within 12 hours or as directed by MD Patient not taking: Reported on 07/13/2016 06/23/16   Shawn C Joy, PA-C  methocarbamol (ROBAXIN) 500 MG tablet Take 1 tablet (500 mg total) by mouth 2 (two) times daily. Patient not taking: Reported on 07/13/2016 06/23/16   Helane Gunther Joy, PA-C  promethazine (PHENERGAN) 25 MG tablet Take 1 tablet (25 mg total) by mouth every 8 (eight) hours as needed for nausea or vomiting. Patient not taking: Reported on 07/13/2016 03/11/16   Quintella Reichert, MD    Family History Family History  Problem Relation Age of Onset  .  Hypertension Mother   . Diabetes Father     Social History Social History  Substance Use Topics  . Smoking status: Former Smoker    Packs/day: 1.00    Years: 7.00    Types: Cigarettes    Quit date: 06/30/2015  . Smokeless tobacco: Never Used  . Alcohol use No     Allergies   Omnipaque [iohexol] and Penicillins   Review of Systems Review of Systems  Constitutional: Negative for chills and fever.  HENT: Negative for congestion.   Eyes: Negative for visual disturbance.  Respiratory: Negative for cough and shortness of breath.   Cardiovascular: Negative for chest pain.  Gastrointestinal: Positive for abdominal pain (Chronic, no change) and constipation. Negative for blood in stool, diarrhea, nausea and vomiting.  Genitourinary: Positive for dysuria and vaginal discharge. Negative for vaginal bleeding.       +  vaginal itching  Musculoskeletal: Negative for back pain and neck pain.  Skin: Negative for rash.  Neurological: Negative for headaches.     Physical Exam Updated Vital Signs BP 116/82 (BP Location: Right Arm)   Pulse 92   Temp 97.6 F (36.4 C) (Oral)   Resp 16   Ht 5\' 7"  (1.702 m)   Wt 121.6 kg   SpO2 99%   BMI 41.97 kg/m   Physical Exam  Constitutional: She is oriented to person, place, and time. She appears well-developed and well-nourished. No distress.  Anxious appearing.  HENT:  Head: Normocephalic and atraumatic.  Cardiovascular: Normal rate, regular rhythm and normal heart sounds.   No murmur heard. Pulmonary/Chest: Effort normal and breath sounds normal. No respiratory distress.  Abdominal: Soft. Bowel sounds are normal. She exhibits no distension.  No abdominal or CVA tenderness.  Genitourinary:  Genitourinary Comments: Chaperone present for exam. No rashes, lesions, or tenderness to external genitalia. + white discharge. No CMT. No adnexal masses, tenderness, or fullness.  No bleeding within vaginal vault. + external hemorrhoids which are  nontender.  Neurological: She is alert and oriented to person, place, and time.  Skin: Skin is warm and dry.  Nursing note and vitals reviewed.    ED Treatments / Results  Labs (all labs ordered are listed, but only abnormal results are displayed) Labs Reviewed  WET PREP, GENITAL - Abnormal; Notable for the following:       Result Value   WBC, Wet Prep HPF POC FEW (*)    All other components within normal limits  URINALYSIS, ROUTINE W REFLEX MICROSCOPIC  POC URINE PREG, ED  GC/CHLAMYDIA PROBE AMP ([place]) NOT AT [facility]    EKG  EKG Interpretation None       Radiology No results found.  Procedures Procedures (including critical care time)  Medications Ordered in ED Medications  cefTRIAXone (ROCEPHIN) injection 250 mg (not administered)  azithromycin (ZITHROMAX) tablet 1,000 mg (not administered)     Initial Impression / Assessment and Plan / ED Course  I have reviewed the triage vital signs and the nursing notes.  Pertinent labs & imaging results that were available during my care of the patient were reviewed by me and considered in my medical decision making (see chart for details).    [name] is a 26 y.o. female who presents to ED for 2 complaints:  1. Vaginal itching and dysuria 4 days. GU exam with white discharge, however no cervical motion or adnexal tenderness. Wet prep shows few WBC's but otherwise unremarkable. Given reassuring wet prep / UA at this point, discussed option of holding treatment until G&C results vs. Prophylactic treatment. Patient would very much prefer treatment today which was given.   2. Persistent hemorrhoids for several months now. Tried topical hydrocortisone with little improvement. She does endorse constipation and straining with bowel movements. On exam, external hemorrhoids which are very small and nontender. Likely in full with straining due to constipation. Recommend she use stool softeners and MiraLAX as needed for  constipation. Increase hydration. Sitz bath's discussed as well.   PCP and OBGYN follow up strongly encouraged. Return precautions discussed. All questions answered.   Final Clinical Impressions(s) / ED Diagnoses   Final diagnoses:  External hemorrhoids  Acute vaginitis    New Prescriptions New Prescriptions   No medications on file     [facility], PA-C 07/13/16 [place], MD 07/14/16 [phone]

## 2016-07-14 LAB — GC/CHLAMYDIA PROBE AMP (~~LOC~~) NOT AT ARMC
Chlamydia: NEGATIVE
NEISSERIA GONORRHEA: NEGATIVE

## 2016-07-15 ENCOUNTER — Emergency Department (HOSPITAL_COMMUNITY)
Admission: EM | Admit: 2016-07-15 | Discharge: 2016-07-15 | Disposition: A | Discharge status: Home / Self Care | Payer: Medicaid Other | Attending: Emergency Medicine | Admitting: Emergency Medicine

## 2016-07-15 ENCOUNTER — Encounter (HOSPITAL_COMMUNITY): Payer: Self-pay

## 2016-07-15 DIAGNOSIS — Z79899 Other long term (current) drug therapy: Secondary | ICD-10-CM | POA: Insufficient documentation

## 2016-07-15 DIAGNOSIS — E119 Type 2 diabetes mellitus without complications: Secondary | ICD-10-CM | POA: Insufficient documentation

## 2016-07-15 DIAGNOSIS — Z87891 Personal history of nicotine dependence: Secondary | ICD-10-CM | POA: Diagnosis not present

## 2016-07-15 DIAGNOSIS — I10 Essential (primary) hypertension: Secondary | ICD-10-CM | POA: Diagnosis not present

## 2016-07-15 DIAGNOSIS — Z7982 Long term (current) use of aspirin: Secondary | ICD-10-CM | POA: Diagnosis not present

## 2016-07-15 DIAGNOSIS — N898 Other specified noninflammatory disorders of vagina: Secondary | ICD-10-CM | POA: Insufficient documentation

## 2016-07-15 LAB — URINALYSIS, ROUTINE W REFLEX MICROSCOPIC
BILIRUBIN URINE: NEGATIVE
Glucose, UA: NEGATIVE mg/dL
Hgb urine dipstick: NEGATIVE
Ketones, ur: NEGATIVE mg/dL
LEUKOCYTES UA: NEGATIVE
Nitrite: NEGATIVE
PH: 5 (ref 5.0–8.0)
Protein, ur: 30 mg/dL — AB
Specific Gravity, Urine: 1.026 (ref 1.005–1.030)

## 2016-07-15 LAB — WET PREP, GENITAL
Clue Cells Wet Prep HPF POC: NONE SEEN
SPERM: NONE SEEN
Trich, Wet Prep: NONE SEEN
YEAST WET PREP: NONE SEEN

## 2016-07-15 LAB — POC URINE PREG, ED: Preg Test, Ur: NEGATIVE

## 2016-07-15 MED ORDER — METRONIDAZOLE 500 MG PO TABS: 500.0000 mg | ORAL_TABLET | Freq: Two times a day (BID) | ORAL | 0 refills | Status: DC

## 2016-07-15 NOTE — Discharge Instructions (Signed)
Please continue to take your medication for bacterial vaginosis. If symptoms persist for 7 days, you can follow up with your primary care provider. If you develop a fever, severe pain, or severe vaginal bleeding, please return to the Emergency Department.

## 2016-07-15 NOTE — ED Provider Notes (Signed)
[name] Provider Note   CSN: [id number] Arrival date & time: 07/15/16  1419  History   Chief Complaint Chief Complaint  Patient presents with  . Vaginal Discharge    HPI [name] is a 26 y.o. female who presents with vaginal itching and discharge for 5 days. [name] was evaluated in the [facility] ED on 3/19 and treated with ceftriaxone and azithromycin for bacterial vaginosis. [name] states [name] is here today because [name] is concerned [name] has a yeast infection after her recent course of antibiotics. [name] reports minimal vaginal bleeding yesterday, which has since resolved. [name] denies fever, chills, abdominal pain, dysuria, hematuria, N/V/D, or constipation.   HPI  Past Medical History:  Diagnosis Date  . Anxiety   . Bipolar 1 disorder ([place])   . Cognitive deficits   . Depression   . Diabetes mellitus without complication ([place])   . Hypertension   . Mental disorder   . Obesity     Patient Active Problem List   Diagnosis Date Noted  . Major depressive disorder, recurrent episode, mild ([place]) 05/04/2016  . 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 Learning disability 07/18/2015  . 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 Diabetes mellitus ([place]) 07/18/2015  . 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 Hyperlipidemia 07/18/2015  . Severe episode of recurrent major depressive disorder, without psychotic features ([place])   . Drug overdose   . Cognitive deficits 10/12/2012  . Generalized anxiety disorder 06/28/2012    Past Surgical History:  Procedure Laterality Date  . CESAREAN SECTION    . CESAREAN SECTION N/A 04/25/2013   Procedure: REPEAT CESAREAN SECTION;  Surgeon: [name], MD;  Location: [facility];  Service: Obstetrics;  Laterality: N/A;  . MASS EXCISION N/A 06/03/2012   Procedure: EXCISION MASS;  Surgeon: [name], MD;  Location: [facility];  Service: ENT;  Laterality: N/A;  Excision uvula  mass  . TONSILLECTOMY N/A 06/03/2012   Procedure: TONSILLECTOMY;  Surgeon: [name], MD;  Location: [facility];  Service: ENT;  Laterality: N/A;  . TONSILLECTOMY      OB History    Gravida Para Term Preterm AB Living   3 3 3  0 0 3   SAB TAB Ectopic Multiple Live Births   0 0 0 0 3       Home Medications    Prior to Admission medications   Medication Sig Start Date End Date Taking? Authorizing Provider  acetaminophen (TYLENOL) 500 MG tablet Take 500 mg by mouth every 6 (six) hours as needed for mild pain or headache.    Historical Provider, MD  busPIRone (BUSPAR) 15 MG tablet Take 15 mg by mouth every evening.     Historical Provider, MD  diphenhydrAMINE (BENADRYL) 25 MG tablet Take 2 tablets (50 mg total) by mouth every 4 (four) hours as needed for itching. Patient not taking: Reported on 07/13/2016 06/25/16   Stacy Moras, PA-C  DULoxetine (CYMBALTA) 60 MG capsule Take 60 mg by mouth at bedtime.     Historical Provider, MD  fluvoxaMINE (LUVOX) 100 MG tablet Take 100 mg by mouth at bedtime.     Historical Provider, MD  glipiZIDE (GLUCOTROL XL) 10 MG 24 hr tablet Take 10 mg by mouth every evening.     Historical Provider, MD  hydrochlorothiazide (HYDRODIURIL) 25 MG tablet Take 25 mg by mouth every evening.     Historical Provider, MD  hydrocortisone (ANUSOL-HC) 2.5 % rectal cream Use 1  application rectally at bedtime. Patient not taking: Reported on 07/13/2016 05/08/16   Stacy Craze, NP  hydrOXYzine (ATARAX/VISTARIL) 25 MG tablet Take 1 tablet (25 mg total) by mouth 3 (three) times daily as needed for anxiety. 05/04/16   Stacy Pour, NP  hydrOXYzine (ATARAX/VISTARIL) 25 MG tablet Take 1 tablet (25 mg total) by mouth every 6 (six) hours. Patient not taking: Reported on 07/13/2016 06/27/16   Stacy Mail, PA-C  ibuprofen (ADVIL,MOTRIN) 600 MG tablet Take 1 tablet (600 mg total) by mouth every 6 (six) hours as needed. Patient not taking: Reported on 07/13/2016 06/23/16    Stacy C Joy, PA-C  lidocaine (LIDODERM) 5 % Place 1 patch onto the skin daily. Remove & Discard patch within 12 hours or as directed by MD Patient not taking: Reported on 07/13/2016 06/23/16   Stacy C Joy, PA-C  metFORMIN (GLUCOPHAGE) 500 MG tablet Take 500 mg by mouth 2 (two) times daily with a meal.    Historical Provider, MD  methocarbamol (ROBAXIN) 500 MG tablet Take 1 tablet (500 mg total) by mouth 2 (two) times daily. Patient not taking: Reported on 07/13/2016 06/23/16   Stacy C Joy, PA-C  metroNIDAZOLE (FLAGYL) 500 MG tablet Take 1 tablet (500 mg total) by mouth 2 (two) times daily. 07/15/16   Stacy Humphres Ophelia Charter, PA-C  ondansetron (ZOFRAN) 4 MG tablet Take 1 tablet (4 mg total) by mouth every 8 (eight) hours as needed for nausea or vomiting. 04/13/16   Stacy Bold, NP  paliperidone (INVEGA SUSTENNA) 234 MG/1.5ML SUSP injection Inject 234 mg into the muscle every 30 (thirty) days.    Historical Provider, MD  promethazine (PHENERGAN) 25 MG tablet Take 1 tablet (25 mg total) by mouth every 8 (eight) hours as needed for nausea or vomiting. Patient not taking: Reported on 07/13/2016 03/11/16   Stacy Reichert, MD    Family History Family History  Problem Relation Age of Onset  . Hypertension Mother   . Diabetes Father     Social History Social History  Substance Use Topics  . Smoking status: Former Smoker    Packs/day: 1.00    Years: 7.00    Types: Cigarettes    Quit date: 06/30/2015  . Smokeless tobacco: Never Used  . Alcohol use No     Allergies   Omnipaque [iohexol] and Penicillins   Review of Systems Review of Systems  Constitutional: Negative for chills and fever.  Gastrointestinal: Negative for abdominal pain, constipation, diarrhea, nausea and vomiting.  Genitourinary: Positive for vaginal bleeding (resolved) and vaginal discharge. Negative for difficulty urinating, dysuria, frequency, hematuria and urgency.       Vaginal itching   Skin: Negative for rash.    Allergic/Immunologic: Positive for immunocompromised state.   Physical Exam Updated Vital Signs BP 120/77   Pulse (!) 104   Temp 98.6 F (37 C) (Oral)   Resp 17   SpO2 97%   Physical Exam  Constitutional: [name] is oriented to person, place, and time. [name] appears well-developed and well-nourished.  HENT:  Head: Normocephalic and atraumatic.  Eyes: Conjunctivae are normal.  Neck: Normal range of motion.  Cardiovascular: Normal rate, regular rhythm and normal heart sounds.  Exam reveals no friction rub.   No murmur heard. Pulmonary/Chest: Effort normal and breath sounds normal. No respiratory distress. [name] has no wheezes. [name] has no rales.  Abdominal: Soft. Bowel sounds are normal. [name] exhibits no distension. There is no tenderness.  Genitourinary: Vagina normal and uterus normal. Pelvic exam was performed  with patient supine. There is no rash, tenderness, lesion or injury on the right labia. There is no rash, tenderness, lesion or injury on the left labia.  Genitourinary Comments: Chaperoned exam. Scant white discharge noted in the vaginal vault.   Musculoskeletal: Normal range of motion. [name] exhibits no tenderness.  Neurological: [name] is alert and oriented to person, place, and time.  Skin: Skin is warm and dry.  Nursing note and vitals reviewed.    ED Treatments / Results  Labs (all labs ordered are listed, but only abnormal results are displayed) Labs Reviewed  WET PREP, GENITAL - Abnormal; Notable for the following:       Result Value   WBC, Wet Prep HPF POC FEW (*)    All other components within normal limits  URINALYSIS, ROUTINE W REFLEX MICROSCOPIC - Abnormal; Notable for the following:    Color, Urine AMBER (*)    APPearance CLOUDY (*)    Protein, ur 30 (*)    Bacteria, UA RARE (*)    Squamous Epithelial / LPF 0-5 (*)    All other components within normal limits  POC URINE PREG, ED    EKG  EKG Interpretation None      Radiology No results  found.  Procedures Procedures (including critical care time)  Medications Ordered in ED Medications - No data to display  Initial Impression / Assessment and Plan / ED Course  I have reviewed the triage vital signs and the nursing notes.  Pertinent labs & imaging results that were available during my care of the patient were reviewed by me and considered in my medical decision making (see chart for details).     26 y.o. female presents to the Emergency Department with bacterial vaginosis. Wet prep positive for clue cells. Negative for yeast and trichomoniasis. Low suspicion for PID. Pregnancy test is negative. UA negative for nitrites and leukocyte esterase. Low suspicion for UTI.   Explained the wet prep procedure to the patient who asked if I could treat her for a yeast infection even if the wet prep was negative. Provided the patient education on side effects of fluticasone if treatment was not indicated. Wet prep was still positive for clue cells. The patient reported [name] was treated in the ED on 3/19 with ceftriaxone and azithromycin. Will discharge the patient today with metronidazole. Discussed at length reasons to follow up with her PCP and reasons to return the the Emergency Department. The patient was agreeable with this plan and acknowledged understanding.  Final Clinical Impressions(s) / ED Diagnoses   Final diagnoses:  Vaginal itching    New Prescriptions Discharge Medication List as of 07/15/2016  3:48 PM       Latasha Puskas Ophelia Charter, PA-C 07/15/16 [place], MD 07/29/16 1204

## 2016-07-15 NOTE — ED Triage Notes (Signed)
Patient arrived by [facility] for ongoing vaginal itching and burning. Seen at [facility] ED 2-3 days ago and treated for BV per patient. States she yeast infection and needs treatment. Also complains of chronic hemorrhoids. NAD. Dysuria for same

## 2016-08-06 ENCOUNTER — Emergency Department (HOSPITAL_COMMUNITY)
Admission: EM | Admit: 2016-08-06 | Discharge: 2016-08-07 | Disposition: A | Discharge status: Home / Self Care | Payer: Medicaid Other | Attending: Emergency Medicine | Admitting: Emergency Medicine

## 2016-08-06 ENCOUNTER — Encounter (HOSPITAL_COMMUNITY): Payer: Self-pay | Admitting: Emergency Medicine

## 2016-08-06 DIAGNOSIS — Z7984 Long term (current) use of oral hypoglycemic drugs: Secondary | ICD-10-CM | POA: Insufficient documentation

## 2016-08-06 DIAGNOSIS — Z79899 Other long term (current) drug therapy: Secondary | ICD-10-CM | POA: Insufficient documentation

## 2016-08-06 DIAGNOSIS — R45851 Suicidal ideations: Secondary | ICD-10-CM

## 2016-08-06 DIAGNOSIS — F319 Bipolar disorder, unspecified: Secondary | ICD-10-CM | POA: Diagnosis not present

## 2016-08-06 DIAGNOSIS — I10 Essential (primary) hypertension: Secondary | ICD-10-CM | POA: Diagnosis not present

## 2016-08-06 DIAGNOSIS — E119 Type 2 diabetes mellitus without complications: Secondary | ICD-10-CM | POA: Insufficient documentation

## 2016-08-06 DIAGNOSIS — Z87891 Personal history of nicotine dependence: Secondary | ICD-10-CM | POA: Insufficient documentation

## 2016-08-06 DIAGNOSIS — F332 Major depressive disorder, recurrent severe without psychotic features: Secondary | ICD-10-CM | POA: Diagnosis present

## 2016-08-06 LAB — COMPREHENSIVE METABOLIC PANEL
ALT: 44 U/L (ref 14–54)
AST: 42 U/L — ABNORMAL HIGH (ref 15–41)
Albumin: 4.4 g/dL (ref 3.5–5.0)
Alkaline Phosphatase: 43 U/L (ref 38–126)
Anion gap: 7 (ref 5–15)
BUN: 12 mg/dL (ref 6–20)
CHLORIDE: 106 mmol/L (ref 101–111)
CO2: 29 mmol/L (ref 22–32)
Calcium: 9.5 mg/dL (ref 8.9–10.3)
Creatinine, Ser: 0.93 mg/dL (ref 0.44–1.00)
Glucose, Bld: 167 mg/dL — ABNORMAL HIGH (ref 65–99)
Potassium: 4 mmol/L (ref 3.5–5.1)
Sodium: 142 mmol/L (ref 135–145)
Total Bilirubin: 0.4 mg/dL (ref 0.3–1.2)
Total Protein: 7.7 g/dL (ref 6.5–8.1)

## 2016-08-06 LAB — RAPID URINE DRUG SCREEN, HOSP PERFORMED
Amphetamines: NOT DETECTED
BENZODIAZEPINES: NOT DETECTED
Barbiturates: NOT DETECTED
COCAINE: NOT DETECTED
Opiates: NOT DETECTED
Tetrahydrocannabinol: NOT DETECTED

## 2016-08-06 LAB — CBC WITH DIFFERENTIAL/PLATELET
BASOS ABS: 0.1 10*3/uL (ref 0.0–0.1)
Basophils Relative: 1 %
EOS PCT: 4 %
Eosinophils Absolute: 0.4 10*3/uL (ref 0.0–0.7)
HEMATOCRIT: 38.9 % (ref 36.0–46.0)
Hemoglobin: 12.6 g/dL (ref 12.0–15.0)
LYMPHS PCT: 39 %
Lymphs Abs: 3.8 10*3/uL (ref 0.7–4.0)
MCH: 26.8 pg (ref 26.0–34.0)
MCHC: 32.4 g/dL (ref 30.0–36.0)
MCV: 82.8 fL (ref 78.0–100.0)
Monocytes Absolute: 0.6 10*3/uL (ref 0.1–1.0)
Monocytes Relative: 6 %
NEUTROS ABS: 5 10*3/uL (ref 1.7–7.7)
Neutrophils Relative %: 50 %
PLATELETS: 223 10*3/uL (ref 150–400)
RBC: 4.7 MIL/uL (ref 3.87–5.11)
RDW: 15 % (ref 11.5–15.5)
WBC: 9.9 10*3/uL (ref 4.0–10.5)

## 2016-08-06 LAB — SALICYLATE LEVEL: Salicylate Lvl: <7 mg/dL (ref 2.8–30.0)

## 2016-08-06 LAB — ACETAMINOPHEN LEVEL

## 2016-08-06 LAB — ETHANOL

## 2016-08-06 MED ORDER — IBUPROFEN 200 MG PO TABS: 600.0000 mg | ORAL_TABLET | Freq: Three times a day (TID) | ORAL | Status: DC | PRN

## 2016-08-06 MED ORDER — ALUM & MAG HYDROXIDE-SIMETH 200-200-20 MG/5ML PO SUSP: 30.0000 mL | ORAL | Status: DC | PRN

## 2016-08-06 MED ORDER — NICOTINE 21 MG/24HR TD PT24: 21.0000 mg | MEDICATED_PATCH | Freq: Every day | TRANSDERMAL | Status: DC

## 2016-08-06 MED ORDER — HYDROCHLOROTHIAZIDE 25 MG PO TABS
25.0000 mg | ORAL_TABLET | Freq: Every evening | ORAL | Status: DC
  Filled 2016-08-06: qty 1

## 2016-08-06 MED ORDER — BUSPIRONE HCL 10 MG PO TABS
15.0000 mg | ORAL_TABLET | Freq: Every evening | ORAL | Status: DC
  Filled 2016-08-06: qty 2

## 2016-08-06 MED ORDER — FLUVOXAMINE MALEATE 100 MG PO TABS
100.0000 mg | ORAL_TABLET | Freq: Every evening | ORAL | Status: DC
  Filled 2016-08-06: qty 1

## 2016-08-06 MED ORDER — GLIPIZIDE ER 10 MG PO TB24
10.0000 mg | ORAL_TABLET | Freq: Every day | ORAL | Status: DC
  Filled 2016-08-06: qty 1

## 2016-08-06 MED ORDER — METFORMIN HCL 500 MG PO TABS
500.0000 mg | ORAL_TABLET | Freq: Two times a day (BID) | ORAL | Status: DC
  Administered 2016-08-07: 500 mg via ORAL
  Filled 2016-08-06: qty 1

## 2016-08-06 MED ORDER — ONDANSETRON HCL 4 MG PO TABS: 4.0000 mg | ORAL_TABLET | Freq: Three times a day (TID) | ORAL | Status: DC | PRN

## 2016-08-06 MED ORDER — DULOXETINE HCL 30 MG PO CPEP
60.0000 mg | ORAL_CAPSULE | Freq: Every evening | ORAL | Status: DC
  Filled 2016-08-06: qty 2

## 2016-08-06 MED ORDER — ACETAMINOPHEN 325 MG PO TABS: 650.0000 mg | ORAL_TABLET | ORAL | Status: DC | PRN

## 2016-08-06 NOTE — ED Triage Notes (Signed)
Pt c/o suicidal ideation with vague plan to overdose on pills. No HI/AVH. Pt depressed because she is obese, wants a boyfriend but doesn't have one, and wants to live alone. Takes antidepressants but they do not work, wants more medication.

## 2016-08-06 NOTE — BH Assessment (Addendum)
Tele Assessment Note   [name] is an 26 y.o. single female who presents unaccompanied to [facility] ED reporting symptoms of depression. Pt has a documented diagnosis of bipolar disorder and reports she has been increasingly depressed for the past several days. She reports she has been having "terrible dreams about my ex-boyfriend." Pt reports symptoms including crying spells, social withdrawal, loss of interest in usual pleasures, fatigue,  decreased concentration, increased sleep, increased appetite and feelings of guilt and hopelessness. Pt says she spends all day in bed and does not want to leave her home. She reports increased anxiety and says she keeps shaving her hair even though she would rather let it grow. She reports current suicidal ideation with plan to overdose on medications. Pt reports she does not want to act on this plan because she would like to see her children. She reports she has acted on suicidal thoughts at least five times by overdosing on medications. She denies current homicidal ideation or history of violence. She denies any history of auditory or visual hallucinations. She denies any history of alcohol or substance use; urine drug screen is negative and blood alcohol level is less than five.  Pt identifies her weight as her primary stressor. She says she feels people laugh about her due to obesity. She also says she misses having contact with her three daughters, ages 51, 45 and 78, who have all been taken into custody by DSS. Pt lives with her mother and her sister and identifies her mother as her primary support. Pt reports her maternal aunt also has mental health problems.  Pt reports she is currently receiving outpatient mental health services through Dr. Bernita Raisin and [facility]. She denies any recent medication changes. She reports she is compliant with all medications. Pt says her physician prescribed "a diet pill" and she is supposed to  start taking it tomorrow.Pt says she has been psychiatrically hospitalized several times and identifies her last inpatient admission at [facility] in October 2017.   Pt is dressed in hospital scrubs, alert, oriented x4 with normal speech and normal motor behavior. Eye contact is good. Pt's mood is depressed and affect is congruent with mood. Thought process is coherent and relevant. There is no indication Pt is currently responding to internal stimuli or experiencing delusional thought content. Pt was pleasant and cooperative throughout assessment. She says she wants to be prescribed a medication that will help with depression and would like to start her diet pill. She says she is willing to sign voluntarily into a psychiatric facility if recommended by psychiatry.   Diagnosis: Bipolar I Disorder, Current Episode Depressed, Severe Without Psychotic Features  Past Medical History:  Past Medical History:  Diagnosis Date  . Anxiety   . Bipolar 1 disorder ([place])   . Cognitive deficits   . Depression   . Diabetes mellitus without complication ([place])   . Hypertension   . Mental disorder   . Obesity     Past Surgical History:  Procedure Laterality Date  . CESAREAN SECTION    . CESAREAN SECTION N/A 04/25/2013   Procedure: REPEAT CESAREAN SECTION;  Surgeon: [name], MD;  Location: [facility];  Service: Obstetrics;  Laterality: N/A;  . MASS EXCISION N/A 06/03/2012   Procedure: EXCISION MASS;  Surgeon: [name], MD;  Location: [facility];  Service: ENT;  Laterality: N/A;  Excision uvula mass  . TONSILLECTOMY N/A 06/03/2012   Procedure: TONSILLECTOMY;  Surgeon: [name], MD;  Location: [facility];  Service: ENT;  Laterality: N/A;  . TONSILLECTOMY      Family History:  Family History  Problem Relation Age of Onset  . Hypertension Mother   . Diabetes Father     Social History:  reports that she quit smoking about 13 months ago. Her smoking use included  Cigarettes. She has a 7.00 pack-year smoking history. She has never used smokeless tobacco. She reports that she does not drink alcohol or use drugs.  Additional Social History:  Alcohol / Drug Use Pain Medications: See PTA Prescriptions: See PTA Over the Counter: See PTA History of alcohol / drug use?: No history of alcohol / drug abuse Longest period of sobriety (when/how long): NA  CIWA: CIWA-Ar BP: (!) 147/102 Pulse Rate: (!) 117 COWS:    PATIENT STRENGTHS: (choose at least two) Ability for insight Child psychotherapist Motivation for treatment/growth Supportive family/friends  Allergies:  Allergies  Allergen Reactions  . Wellbutrin [Bupropion] Shortness Of Breath  . Omnipaque [Iohexol] Swelling and Other (See Comments)    Reaction:  Eye swelling  . Penicillins Hives and Other (See Comments)    Has patient had a PCN reaction causing immediate rash, facial/tongue/throat swelling, SOB or lightheadedness with hypotension: No Has patient had a PCN reaction causing severe rash involving mucus membranes or skin necrosis: No Has patient had a PCN reaction that required hospitalization No Has patient had a PCN reaction occurring within the last 10 years: No If all of the above answers are "NO", then may proceed with Cephalosporin use.    Home Medications:  (Not in a hospital admission)  OB/GYN Status:  No LMP recorded. Patient has had an implant.  General Assessment Data [facility] TTS Assessment: In system Is this a Tele or Face-to-Face Assessment?: Face-to-Face Is this an Initial Assessment or a Re-assessment for this encounter?: Initial Assessment Marital status: Single Maiden name: NA Is patient pregnant?: No Pregnancy Status: No Living Arrangements: Parent, Other relatives (Mother, sister) Can pt return to current living arrangement?: Yes Admission Status: Voluntary Is patient capable of signing voluntary admission?: Yes Referral  Source: Self/Family/Friend Insurance type: Medicaid     Crisis Care Plan Living Arrangements: Parent, Other relatives (Mother, sister) Legal Guardian: Other: (Pt says she is unsure) Name of Psychiatrist: Dr. Bernita Raisin Name of Therapist: [facility]  Education Status Is patient currently in school?: No Current Grade: NA Highest grade of school patient has completed: 23 Name of school: NA Contact person: NA  Risk to self with the past 6 months Suicidal Ideation: Yes-Currently Present Has patient been a risk to self within the past 6 months prior to admission? : Yes Suicidal Intent: No Has patient had any suicidal intent within the past 6 months prior to admission? : No Is patient at risk for suicide?: Yes Suicidal Plan?: Yes-Currently Present Has patient had any suicidal plan within the past 6 months prior to admission? : Yes Specify Current Suicidal Plan: Overdose on medications Access to Means: Yes Specify Access to Suicidal Means: Access to multiple medications What has been your use of drugs/alcohol within the last 12 months?: Pt denies Previous Attempts/Gestures: Yes How many times?: 5 (Pt reports she has overdosed 5 times in the past) Other Self Harm Risks: None Triggers for Past Attempts: Unknown Intentional Self Injurious Behavior: None Family Suicide History: No Recent stressful life event(s): Other (Comment) (obesity) Persecutory voices/beliefs?: No Depression: Yes Depression Symptoms: Despondent, Tearfulness, Isolating, Fatigue, Feeling worthless/self pity  Substance abuse history and/or treatment for substance abuse?: No Suicide prevention information given to non-admitted patients: Not applicable  Risk to Others within the past 6 months Homicidal Ideation: No Does patient have any lifetime risk of violence toward others beyond the six months prior to admission? : No Thoughts of Harm to Others: No Current Homicidal Intent: No Current  Homicidal Plan: No Access to Homicidal Means: No Identified Victim: None History of harm to others?: No Assessment of Violence: None Noted Violent Behavior Description: Pt denies Does patient have access to weapons?: No Criminal Charges Pending?: No Does patient have a court date: No Is patient on probation?: No  Psychosis Hallucinations: None noted Delusions: None noted  Mental Status Report Appearance/Hygiene: In scrubs Eye Contact: Good Motor Activity: Unremarkable Speech: Logical/coherent Level of Consciousness: Alert Mood: Depressed Affect: Appropriate to circumstance Anxiety Level: Minimal Thought Processes: Coherent, Relevant Judgement: Partial Orientation: Person, Place, Time, Situation, Appropriate for developmental age Obsessive Compulsive Thoughts/Behaviors: None  Cognitive Functioning Concentration: Normal Memory: Recent Intact, Remote Intact IQ: Average Insight: Fair Impulse Control: Fair Appetite: Good Weight Loss: 0 Weight Gain: 5 Sleep: Increased Total Hours of Sleep: 10 Vegetative Symptoms: Staying in bed  ADLScreening [facility] Services) Patient's cognitive ability adequate to safely complete daily activities?: Yes Patient able to express need for assistance with ADLs?: Yes Independently performs ADLs?: Yes (appropriate for developmental age)  Prior Inpatient Therapy Prior Inpatient Therapy: Yes Prior Therapy Dates: 01/2016, multiple admits Prior Therapy Facilty/Provider(s): [facility], other facilities Reason for Treatment: Depression  Prior Outpatient Therapy Prior Outpatient Therapy: Yes Prior Therapy Dates: Current Prior Therapy Facilty/Provider(s): [facility] Reason for Treatment: Depression Does patient have an ACCT team?: No Does patient have Intensive In-House Services?  : No Does patient have [facility] services? : No Does patient have P4CC services?: No  ADL Screening (condition at time of  admission) Patient's cognitive ability adequate to safely complete daily activities?: Yes Is the patient deaf or have difficulty hearing?: No Does the patient have difficulty seeing, even when wearing glasses/contacts?: No Does the patient have difficulty concentrating, remembering, or making decisions?: No Patient able to express need for assistance with ADLs?: Yes Does the patient have difficulty dressing or bathing?: No Independently performs ADLs?: Yes (appropriate for developmental age) Does the patient have difficulty walking or climbing stairs?: No Weakness of Legs: None Weakness of Arms/Hands: None  Home Assistive Devices/Equipment Home Assistive Devices/Equipment: None    Abuse/Neglect Assessment (Assessment to be complete while patient is alone) Physical Abuse: Denies Verbal Abuse: Denies Sexual Abuse: Denies Exploitation of patient/patient's resources: Denies Self-Neglect: Denies     Regulatory affairs officer (For Healthcare) Does Patient Have a Medical Advance Directive?: No Would patient like information on creating a medical advance directive?: No - Patient declined    Additional Information 1:1 In Past 12 Months?: No CIRT Risk: No Elopement Risk: No Does patient have medical clearance?: Yes     Disposition: Nash Mantis, AC at [facility], confirms adult unit is at capacity. Gave clinical report to Lindon Romp, NP who said Pt meets criteria for inpatient psychiatric treatment. TTS will contact facilities for placement. Notified Margarita Mail, PA-C and TCU staff of recommendation.  Disposition Initial Assessment Completed for this Encounter: Yes Disposition of Patient: Other dispositions Other disposition(s): Other (Comment)   Evelena Peat, [facility], [facility], [facility] Triage Specialist [phone]   Evelena Peat 08/06/2016 8:49 PM

## 2016-08-06 NOTE — ED Notes (Signed)
Bed: [facility] Expected date:  Expected time:  Means of arrival:  Comments: 

## 2016-08-06 NOTE — ED Provider Notes (Signed)
[facility] DEPT Provider Note   CSN: [id number] Arrival date & time: 08/06/16  1810     History   Chief Complaint Chief Complaint  Patient presents with  . Suicidal    HPI [name] is a 26 y.o. female.He is well-known to this emergency department. She is a past medical history of diabetes, morbid obesity, cognitive deficits, bipolar disorder. The patient presents for depression and suicidal ideation with plan to overdose. Patient states that she is tired of being obese just wants to lose weight. She states that she compulsively shaves her head and really wants to go her hair out but she said every day. The patient is also tearful because her children are out in foster care and she states that she misses them terribly. She says she is also been having dreams about her ex-boyfriend and has been feeling extremely lonely, isolated and tearful. The patient states he is complaining on overdosing on all of her medications. She is previous history of previous major depressive disorder and suicidal ideation. Patient is here voluntarily at this time. She denies homicidal ideation or audiovisual hallucinations.  HPI  Past Medical History:  Diagnosis Date  . Anxiety   . Bipolar 1 disorder ([place])   . Cognitive deficits   . Depression   . Diabetes mellitus without complication ([place])   . Hypertension   . Mental disorder   . Obesity     Patient Active Problem List   Diagnosis Date Noted  . Major depressive disorder, recurrent episode, mild ([place]) 05/04/2016  . 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 Learning disability 07/18/2015  . 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 Diabetes mellitus ([place]) 07/18/2015  . 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 Hyperlipidemia 07/18/2015  . Severe episode of recurrent major depressive disorder, without psychotic features ([place])   . Drug overdose   . Cognitive deficits 10/12/2012  .  Generalized anxiety disorder 06/28/2012    Past Surgical History:  Procedure Laterality Date  . CESAREAN SECTION    . CESAREAN SECTION N/A 04/25/2013   Procedure: REPEAT CESAREAN SECTION;  Surgeon: [name], MD;  Location: [facility];  Service: Obstetrics;  Laterality: N/A;  . MASS EXCISION N/A 06/03/2012   Procedure: EXCISION MASS;  Surgeon: [name], MD;  Location: [facility];  Service: ENT;  Laterality: N/A;  Excision uvula mass  . TONSILLECTOMY N/A 06/03/2012   Procedure: TONSILLECTOMY;  Surgeon: [name], MD;  Location: [facility];  Service: ENT;  Laterality: N/A;  . TONSILLECTOMY      OB History    Gravida Para Term Preterm AB Living   3 3 3  0 0 3   SAB TAB Ectopic Multiple Live Births   0 0 0 0 3       Home Medications    Prior to Admission medications   Medication Sig Start Date End Date Taking? Authorizing Provider  acetaminophen (TYLENOL) 500 MG tablet Take 1,000 mg by mouth every 6 (six) hours as needed for mild pain, moderate pain, fever or headache.    Yes Historical Provider, MD  busPIRone (BUSPAR) 15 MG tablet Take 15 mg by mouth every evening.    Yes Historical Provider, MD  DULoxetine (CYMBALTA) 60 MG capsule Take 60 mg by mouth every evening.    Yes Historical Provider, MD  fluvoxaMINE (LUVOX) 100 MG tablet Take 100 mg by mouth every evening.    Yes Historical Provider, MD  glipiZIDE (GLUCOTROL XL) 10  MG 24 hr tablet Take 10 mg by mouth every evening.    Yes Historical Provider, MD  hydrochlorothiazide (HYDRODIURIL) 25 MG tablet Take 25 mg by mouth every evening.    Yes Historical Provider, MD  metFORMIN (GLUCOPHAGE) 500 MG tablet Take 500 mg by mouth 2 (two) times daily with a meal.   Yes Historical Provider, MD  ondansetron (ZOFRAN) 4 MG tablet Take 1 tablet (4 mg total) by mouth every 8 (eight) hours as needed for nausea or vomiting. 04/13/16  Yes Bernadene Bell Wojeck, NP  paliperidone (INVEGA SUSTENNA) 234 MG/1.5ML SUSP  injection Inject 234 mg into the muscle every 30 (thirty) days.   Yes Historical Provider, MD    Family History Family History  Problem Relation Age of Onset  . Hypertension Mother   . Diabetes Father     Social History Social History  Substance Use Topics  . Smoking status: Former Smoker    Packs/day: 1.00    Years: 7.00    Types: Cigarettes    Quit date: 06/30/2015  . Smokeless tobacco: Never Used  . Alcohol use No     Allergies   Wellbutrin [bupropion]; Omnipaque [iohexol]; and Penicillins   Review of Systems Review of Systems  Ten systems reviewed and are negative for acute change, except as noted in the HPI.   Physical Exam Updated Vital Signs BP (!) 147/102 (BP Location: Left Arm)   Pulse (!) 117   Temp 98.8 F (37.1 C) (Oral)   Resp (!) 28   SpO2 99%   Physical Exam  Constitutional: She is oriented to person, place, and time. She appears well-developed and well-nourished. No distress.  HENT:  Head: Normocephalic and atraumatic.  Eyes: Conjunctivae are normal. No scleral icterus.  Neck: Normal range of motion.  Cardiovascular: Normal rate, regular rhythm and normal heart sounds.  Exam reveals no gallop and no friction rub.   No murmur heard. Pulmonary/Chest: Effort normal and breath sounds normal. No respiratory distress.  Abdominal: Soft. Bowel sounds are normal. She exhibits no distension and no mass. There is no tenderness. There is no guarding.  Neurological: She is alert and oriented to person, place, and time.  Skin: Skin is warm and dry. She is not diaphoretic.  Psychiatric:  Tearful  Nursing note and vitals reviewed.    ED Treatments / Results  Labs (all labs ordered are listed, but only abnormal results are displayed) Labs Reviewed  COMPREHENSIVE METABOLIC PANEL - Abnormal; Notable for the following:       Result Value   Glucose, Bld 167 (*)    AST 42 (*)    All other components within normal limits  ACETAMINOPHEN LEVEL - Abnormal;  Notable for the following:    Acetaminophen (Tylenol), Serum <10 (*)    All other components within normal limits  ETHANOL  CBC WITH DIFFERENTIAL/PLATELET  RAPID URINE DRUG SCREEN, HOSP PERFORMED  SALICYLATE LEVEL    EKG  EKG Interpretation None       Radiology No results found.  Procedures Procedures (including critical care time)  Medications Ordered in ED Medications  alum & mag hydroxide-simeth (MAALOX/MYLANTA) 200-200-20 MG/5ML suspension 30 mL (not administered)  ondansetron (ZOFRAN) tablet 4 mg (not administered)  nicotine (NICODERM CQ - dosed in mg/24 hours) patch 21 mg (not administered)  ibuprofen (ADVIL,MOTRIN) tablet 600 mg (not administered)  acetaminophen (TYLENOL) tablet 650 mg (not administered)  busPIRone (BUSPAR) tablet 15 mg (15 mg Oral Not Given 08/06/16 2109)  DULoxetine (CYMBALTA) DR capsule 60 mg (  60 mg Oral Not Given 08/06/16 2110)  fluvoxaMINE (LUVOX) tablet 100 mg (not administered)  glipiZIDE (GLUCOTROL XL) 24 hr tablet 10 mg (not administered)  hydrochlorothiazide (HYDRODIURIL) tablet 25 mg (not administered)  metFORMIN (GLUCOPHAGE) tablet 500 mg (not administered)  ondansetron (ZOFRAN) tablet 4 mg (not administered)     Initial Impression / Assessment and Plan / ED Course  I have reviewed the triage vital signs and the nursing notes.  Pertinent labs & imaging results that were available during my care of the patient were reviewed by me and considered in my medical decision making (see chart for details).     Patient is medically clear  Final Clinical Impressions(s) / ED Diagnoses   Final diagnoses:  Suicidal ideation    New Prescriptions New Prescriptions   No medications on file     Margarita Mail, PA-C 08/07/16 0107    Leo Grosser, MD 08/07/16 [phone]

## 2016-08-06 NOTE — ED Notes (Signed)
Bed: WA27 Expected date:  Expected time:  Means of arrival:  Comments: TR 3

## 2016-08-07 NOTE — ED Notes (Signed)
Pt resting comfortably at this time. NAD noted.  

## 2016-08-07 NOTE — Consult Note (Signed)
[facility] Psychiatry Consult   Reason for Consult:  Worsening depression Referring Physician:  EDP Patient Identification: [name] MRN:  [id number] Principal Diagnosis: Severe episode of recurrent major depressive disorder, without psychotic features ([place]) Diagnosis:   Patient Active Problem List   Diagnosis Date Noted  . Severe episode of recurrent major depressive disorder, without psychotic features ([place]) [F33.2]     Priority: High  . Major depressive disorder, recurrent episode, mild ([place]) [F33.0] 05/04/2016  . 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 Learning disability [F81.9] 07/18/2015  . 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 Diabetes mellitus ([place]) [E11.9] 07/18/2015  . MDD (major depressive disorder), recurrent, severe, with psychosis ([place]) [F33.3] 07/18/2015  . Hyperlipidemia [E78.5] 07/18/2015  . Drug overdose [T50.901A]   . Cognitive deficits [R41.89] 10/12/2012  . Generalized anxiety disorder [F41.1] 06/28/2012    Total Time spent with patient: 30 minutes  Subjective:   [name] is a 26 y.o. female patient admitted with worsening depression .  HPI:  [name] is an 26 y.o. single female who presented unaccompanied to [facility] ED reporting symptoms of depression. Pt has a documented diagnosis of bipolar disorder and reports she has been increasingly depressed for the past several days.  States she has been having terrible dreams, crying spells, lack of motivation.  She reported current SI, planning to overdose on medications but stops as she thinks about her children  three daughters, ages 48, 49 and 70.  She denies current homicidal ideation or history of violence. She denies any history of auditory or visual hallucinations. She denies any history of alcohol or substance use; urine drug screen is negative and blood alcohol level is less than five.  She is known to the ED and has been here several times.  She states that  her trigger is her weight and low self esteem, however, she is being prescribed weight loss medication by her PCP Caryl Pina  at [facility].  Past Psychiatric History: see HPI  Risk to Self: Suicidal Ideation: denies Suicidal Intent: No Is patient at risk for suicide?: denies Suicidal Plan?: none Specify Current Suicidal Plan: Overdose on medications Access to Means: Yes Specify Access to Suicidal Means: Access to multiple medications What has been your use of drugs/alcohol within the last 12 months?: Pt denies How many times?: 5 (Pt reports she has overdosed 5 times in the past) Other Self Harm Risks: None Triggers for Past Attempts: Unknown Intentional Self Injurious Behavior: None Risk to Others: Homicidal Ideation: No Thoughts of Harm to Others: No Current Homicidal Intent: No Current Homicidal Plan: No Access to Homicidal Means: No Identified Victim: None History of harm to others?: No Assessment of Violence: None Noted Violent Behavior Description: Pt denies Does patient have access to weapons?: No Criminal Charges Pending?: No Does patient have a court date: No Prior Inpatient Therapy: Prior Inpatient Therapy: Yes Prior Therapy Dates: 01/2016, multiple admits Prior Therapy Facilty/Provider(s): [facility], other facilities Reason for Treatment: Depression Prior Outpatient Therapy: Prior Outpatient Therapy: Yes Prior Therapy Dates: Current Prior Therapy Facilty/Provider(s): [facility] Reason for Treatment: Depression Does patient have an ACCT team?: No Does patient have Intensive In-House Services?  : No Does patient have [facility] services? : No Does patient have P4CC services?: No  Past Medical History:  Past Medical History:  Diagnosis Date  . Anxiety   . Bipolar 1 disorder ([place])   . Cognitive deficits   . Depression   . Diabetes mellitus without complication ([place])   .  Hypertension   . Mental disorder   . Obesity     Past Surgical History:   Procedure Laterality Date  . CESAREAN SECTION    . CESAREAN SECTION N/A 04/25/2013   Procedure: REPEAT CESAREAN SECTION;  Surgeon: [name], MD;  Location: [facility];  Service: Obstetrics;  Laterality: N/A;  . MASS EXCISION N/A 06/03/2012   Procedure: EXCISION MASS;  Surgeon: [name], MD;  Location: [facility];  Service: ENT;  Laterality: N/A;  Excision uvula mass  . TONSILLECTOMY N/A 06/03/2012   Procedure: TONSILLECTOMY;  Surgeon: [name], MD;  Location: [facility];  Service: ENT;  Laterality: N/A;  . TONSILLECTOMY     Family History:  Family History  Problem Relation Age of Onset  . Hypertension Mother   . Diabetes Father    Family Psychiatric  History: see HPI Social History:  History  Alcohol Use No     History  Drug Use No    Comment: Patient denies    Social History   Social History  . Marital status: Single    Spouse name: N/A  . Number of children: N/A  . Years of education: N/A   Social History Main Topics  . Smoking status: Former Smoker    Packs/day: 1.00    Years: 7.00    Types: Cigarettes    Quit date: 06/30/2015  . Smokeless tobacco: Never Used  . Alcohol use No  . Drug use: No     Comment: Patient denies  . Sexual activity: No   Other Topics Concern  . None   Social History Narrative  . None   Additional Social History:    Allergies:   Allergies  Allergen Reactions  . Wellbutrin [Bupropion] Shortness Of Breath  . Omnipaque [Iohexol] Swelling and Other (See Comments)    Reaction:  Eye swelling  . Penicillins Hives and Other (See Comments)    Has patient had a PCN reaction causing immediate rash, facial/tongue/throat swelling, SOB or lightheadedness with hypotension: No Has patient had a PCN reaction causing severe rash involving mucus membranes or skin necrosis: No Has patient had a PCN reaction that required hospitalization No Has patient had a PCN reaction occurring within the last 10 years:  No If all of the above answers are "NO", then may proceed with Cephalosporin use.    Labs:  Results for orders placed or performed during the hospital encounter of 08/06/16 (from the past 48 hour(s))  Comprehensive metabolic panel     Status: Abnormal   Collection Time: 08/06/16  7:51 PM  Result Value Ref Range   Sodium 142 135 - 145 mmol/L   Potassium 4.0 3.5 - 5.1 mmol/L   Chloride 106 101 - 111 mmol/L   CO2 29 22 - 32 mmol/L   Glucose, Bld 167 (H) 65 - 99 mg/dL   BUN 12 6 - 20 mg/dL   Creatinine, Ser 0.93 0.44 - 1.00 mg/dL   Calcium 9.5 8.9 - 10.3 mg/dL   Total Protein 7.7 6.5 - 8.1 g/dL   Albumin 4.4 3.5 - 5.0 g/dL   AST 42 (H) 15 - 41 U/L   ALT 44 14 - 54 U/L   Alkaline Phosphatase 43 38 - 126 U/L   Total Bilirubin 0.4 0.3 - 1.2 mg/dL   GFR calc non Af Amer >60 >60 mL/min   GFR calc Af Amer >60 >60 mL/min    Comment: (NOTE) The eGFR has been calculated using the CKD EPI equation. This  calculation has not been validated in all clinical situations. eGFR's persistently <60 mL/min signify possible Chronic Kidney Disease.    Anion gap 7 5 - 15  Ethanol     Status: None   Collection Time: 08/06/16  7:51 PM  Result Value Ref Range   Alcohol, Ethyl (B) <5 <5 mg/dL    Comment:        LOWEST DETECTABLE LIMIT FOR SERUM ALCOHOL IS 5 mg/dL FOR MEDICAL PURPOSES ONLY   CBC with Diff     Status: None   Collection Time: 08/06/16  7:51 PM  Result Value Ref Range   WBC 9.9 4.0 - 10.5 K/uL   RBC 4.70 3.87 - 5.11 MIL/uL   Hemoglobin 12.6 12.0 - 15.0 g/dL   HCT 38.9 36.0 - 46.0 %   MCV 82.8 78.0 - 100.0 fL   MCH 26.8 26.0 - 34.0 pg   MCHC 32.4 30.0 - 36.0 g/dL   RDW 15.0 11.5 - 15.5 %   Platelets 223 150 - 400 K/uL   Neutrophils Relative % 50 %   Neutro Abs 5.0 1.7 - 7.7 K/uL   Lymphocytes Relative 39 %   Lymphs Abs 3.8 0.7 - 4.0 K/uL   Monocytes Relative 6 %   Monocytes Absolute 0.6 0.1 - 1.0 K/uL   Eosinophils Relative 4 %   Eosinophils Absolute 0.4 0.0 - 0.7 K/uL    Basophils Relative 1 %   Basophils Absolute 0.1 0.0 - 0.1 K/uL  Urine rapid drug screen (hosp performed)not at [facility]     Status: None   Collection Time: 08/06/16  7:51 PM  Result Value Ref Range   Opiates NONE DETECTED NONE DETECTED   Cocaine NONE DETECTED NONE DETECTED   Benzodiazepines NONE DETECTED NONE DETECTED   Amphetamines NONE DETECTED NONE DETECTED   Tetrahydrocannabinol NONE DETECTED NONE DETECTED   Barbiturates NONE DETECTED NONE DETECTED    Comment:        DRUG SCREEN FOR MEDICAL PURPOSES ONLY.  IF CONFIRMATION IS NEEDED FOR ANY PURPOSE, NOTIFY LAB WITHIN 5 DAYS.        LOWEST DETECTABLE LIMITS FOR URINE DRUG SCREEN Drug Class       Cutoff (ng/mL) Amphetamine      1000 Barbiturate      200 Benzodiazepine   161 Tricyclics       096 Opiates          300 Cocaine          300 THC              50   Acetaminophen level     Status: Abnormal   Collection Time: 08/06/16  7:51 PM  Result Value Ref Range   Acetaminophen (Tylenol), Serum <10 (L) 10 - 30 ug/mL    Comment:        THERAPEUTIC CONCENTRATIONS VARY SIGNIFICANTLY. A RANGE OF 10-30 ug/mL MAY BE AN EFFECTIVE CONCENTRATION FOR MANY PATIENTS. HOWEVER, SOME ARE BEST TREATED AT CONCENTRATIONS OUTSIDE THIS RANGE. ACETAMINOPHEN CONCENTRATIONS >150 ug/mL AT 4 HOURS AFTER INGESTION AND >50 ug/mL AT 12 HOURS AFTER INGESTION ARE OFTEN ASSOCIATED WITH TOXIC REACTIONS.   Salicylate level     Status: None   Collection Time: 08/06/16  7:51 PM  Result Value Ref Range   Salicylate Lvl <0.4 2.8 - 30.0 mg/dL    Current Facility-Administered Medications  Medication Dose Route Frequency Provider Last Rate Last Dose  . acetaminophen (TYLENOL) tablet 650 mg  650 mg Oral Q4H PRN Margarita Mail, PA-C      .  alum & mag hydroxide-simeth (MAALOX/MYLANTA) 200-200-20 MG/5ML suspension 30 mL  30 mL Oral PRN Margarita Mail, PA-C      . busPIRone (BUSPAR) tablet 15 mg  15 mg Oral QPM Margarita Mail, PA-C      . DULoxetine (CYMBALTA)  DR capsule 60 mg  60 mg Oral QPM Abigail Harris, PA-C      . fluvoxaMINE (LUVOX) tablet 100 mg  100 mg Oral QPM Abigail Harris, PA-C      . glipiZIDE (GLUCOTROL XL) 24 hr tablet 10 mg  10 mg Oral Q supper Margarita Mail, PA-C      . hydrochlorothiazide (HYDRODIURIL) tablet 25 mg  25 mg Oral QPM Abigail Harris, PA-C      . ibuprofen (ADVIL,MOTRIN) tablet 600 mg  600 mg Oral Q8H PRN Margarita Mail, PA-C      . metFORMIN (GLUCOPHAGE) tablet 500 mg  500 mg Oral BID WC Margarita Mail, PA-C   500 mg at 08/07/16 0813  . nicotine (NICODERM CQ - dosed in mg/24 hours) patch 21 mg  21 mg Transdermal Daily Abigail Harris, PA-C      . ondansetron (ZOFRAN) tablet 4 mg  4 mg Oral Q8H PRN Margarita Mail, PA-C      . ondansetron (ZOFRAN) tablet 4 mg  4 mg Oral Q8H PRN Margarita Mail, PA-C       Current Outpatient Prescriptions  Medication Sig Dispense Refill  . acetaminophen (TYLENOL) 500 MG tablet Take 1,000 mg by mouth every 6 (six) hours as needed for mild pain, moderate pain, fever or headache.     . busPIRone (BUSPAR) 15 MG tablet Take 15 mg by mouth every evening.     . DULoxetine (CYMBALTA) 60 MG capsule Take 60 mg by mouth every evening.   1  . fluvoxaMINE (LUVOX) 100 MG tablet Take 100 mg by mouth every evening.     Marland Kitchen glipiZIDE (GLUCOTROL XL) 10 MG 24 hr tablet Take 10 mg by mouth every evening.     . hydrochlorothiazide (HYDRODIURIL) 25 MG tablet Take 25 mg by mouth every evening.     . metFORMIN (GLUCOPHAGE) 500 MG tablet Take 500 mg by mouth 2 (two) times daily with a meal.    . ondansetron (ZOFRAN) 4 MG tablet Take 1 tablet (4 mg total) by mouth every 8 (eight) hours as needed for nausea or vomiting. 12 tablet 0  . paliperidone (INVEGA SUSTENNA) 234 MG/1.5ML SUSP injection Inject 234 mg into the muscle every 30 (thirty) days.      Musculoskeletal: Strength & Muscle Tone: within normal limits Gait & Station: normal Patient leans: N/A  Psychiatric Specialty Exam: Physical Exam  ROS  Blood  pressure 114/64, pulse (!) 106, temperature 98.5 F (36.9 C), temperature source Oral, resp. rate 19, SpO2 97 %.There is no height or weight on file to calculate BMI.  General Appearance: Fairly Groomed  Eye Contact:  Fair  Speech:  Clear and Coherent  Volume:  Normal  Mood:  Euthymic  Affect:  Appropriate  Thought Process:  Coherent  Orientation:  Full (Time, Place, and Person)  Thought Content:  Logical  Suicidal Thoughts:  No  Homicidal Thoughts:  No  Memory:  Immediate;   Fair Recent;   Fair Remote;   Fair  Judgement:  Intact  Insight:  Fair  Psychomotor Activity:  Increased  Concentration:  Concentration: Fair and Attention Span: Fair  Recall:  [employer] of Knowledge:  Fair  Language:  Fair  Akathisia:  No  Handed:  Right  AIMS (if indicated):     Assets:  Desire for Improvement Resilience Social Support  ADL's:  Intact  Cognition:  WNL  Sleep:      Treatment Plan Summary: Daily contact with patient to assess and evaluate symptoms and progress in treatment and Medication management  Disposition: No evidence of imminent risk to self or others at present.   Patient does not meet criteria for psychiatric inpatient admission. Supportive therapy provided about ongoing stressors. Discussed crisis plan, support from social network, calling 911, coming to the Emergency Department, and calling Suicide Hotline.  Janett Labella, NP [facility] 08/07/2016 1:17 PM  Patient seen face-to-face for psychiatric evaluation, chart reviewed and case discussed with the physician extender and developed treatment plan. Reviewed the information documented and agree with the treatment plan. [name], MD

## 2016-08-07 NOTE — BH Assessment (Signed)
[facility] Assessment Progress Note  Per [name], MD, this pt does not require psychiatric hospitalization at this time.  Pt is to be discharged from [facility] with recommendation to continue treatment with Bernita Raisin, MD and with [facility].  This has been included in pt's discharge instructions.  Pt's nurse has been notified.  Jalene Mullet, [place] Triage Specialist [phone]

## 2016-08-07 NOTE — Discharge Instructions (Signed)
For your ongoing behavioral health needs, you are advised to continue treatment with Bernita Raisin, MD, and with [facility]:       [facility]      [address] [place], [place] [address]      [phone]

## 2016-08-18 ENCOUNTER — Encounter (HOSPITAL_COMMUNITY): Payer: Self-pay | Admitting: Family Medicine

## 2016-08-18 ENCOUNTER — Emergency Department (HOSPITAL_COMMUNITY)
Admission: EM | Admit: 2016-08-18 | Discharge: 2016-08-18 | Disposition: A | Discharge status: Home / Self Care | Payer: Medicaid Other | Attending: Physician Assistant | Admitting: Physician Assistant

## 2016-08-18 DIAGNOSIS — I10 Essential (primary) hypertension: Secondary | ICD-10-CM | POA: Diagnosis not present

## 2016-08-18 DIAGNOSIS — M79642 Pain in left hand: Secondary | ICD-10-CM | POA: Diagnosis not present

## 2016-08-18 DIAGNOSIS — M79672 Pain in left foot: Secondary | ICD-10-CM | POA: Diagnosis not present

## 2016-08-18 DIAGNOSIS — Z7984 Long term (current) use of oral hypoglycemic drugs: Secondary | ICD-10-CM | POA: Insufficient documentation

## 2016-08-18 DIAGNOSIS — M79671 Pain in right foot: Secondary | ICD-10-CM

## 2016-08-18 DIAGNOSIS — Z79899 Other long term (current) drug therapy: Secondary | ICD-10-CM | POA: Insufficient documentation

## 2016-08-18 DIAGNOSIS — E119 Type 2 diabetes mellitus without complications: Secondary | ICD-10-CM | POA: Insufficient documentation

## 2016-08-18 DIAGNOSIS — F17210 Nicotine dependence, cigarettes, uncomplicated: Secondary | ICD-10-CM | POA: Insufficient documentation

## 2016-08-18 DIAGNOSIS — M79641 Pain in right hand: Secondary | ICD-10-CM | POA: Diagnosis not present

## 2016-08-18 NOTE — ED Provider Notes (Signed)
[name] Provider Note   CSN: [id number] Arrival date & time: 08/18/16  1527   By signing my name below, I, [name], attest that this documentation has been prepared under the direction and in the presence of Heath Lark, PA-C Electronically Signed: Soijett [name], ED Scribe. 08/18/16. 4:36 PM.  History   Chief Complaint Chief Complaint  Patient presents with  . Foot Pain  . Hand Pain    HPI [name] is a 26 y.o. female with a PMHx of HTN, DM, mental disorder, who presents to the Emergency Department complaining of 7/10, acute-on-chronic, intermittent, aching, bilateral foot pain right > left onset 1-2 months worsening this morning. Pt noticed the bilateral foot pain following ambulation this morning. She notes that her bilateral foot pain is migratory and is now radiating to her calf, right foot, and right toe. Pt reports associated bilateral hand pain and tingling to bilateral hands and feet. Pt has tried Rx gabapentin with no relief of her symptoms. She states that she has been seen and treated for her symptoms by her PCP. She denies color change, wound, swelling, recent injury, recent trauma, and any other symptoms.    The history is provided by the patient. No language interpreter was used.    Past Medical History:  Diagnosis Date  . Anxiety   . Bipolar 1 disorder ([place])   . Cognitive deficits   . Depression   . Diabetes mellitus without complication ([place])   . Hypertension   . Mental disorder   . Obesity     Patient Active Problem List   Diagnosis Date Noted  . Major depressive disorder, recurrent episode, mild ([place]) 05/04/2016  . 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 Learning disability 07/18/2015  . 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 Diabetes mellitus ([place]) 07/18/2015  . 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 Hyperlipidemia 07/18/2015  . Severe episode of recurrent major depressive  disorder, without psychotic features ([place])   . Drug overdose   . Cognitive deficits 10/12/2012  . Generalized anxiety disorder 06/28/2012    Past Surgical History:  Procedure Laterality Date  . CESAREAN SECTION    . CESAREAN SECTION N/A 04/25/2013   Procedure: REPEAT CESAREAN SECTION;  Surgeon: [name], MD;  Location: [facility];  Service: Obstetrics;  Laterality: N/A;  . MASS EXCISION N/A 06/03/2012   Procedure: EXCISION MASS;  Surgeon: [name], MD;  Location: [facility];  Service: ENT;  Laterality: N/A;  Excision uvula mass  . TONSILLECTOMY N/A 06/03/2012   Procedure: TONSILLECTOMY;  Surgeon: [name], MD;  Location: [facility];  Service: ENT;  Laterality: N/A;  . TONSILLECTOMY      OB History    Gravida Para Term Preterm AB Living   3 3 3  0 0 3   SAB TAB Ectopic Multiple Live Births   0 0 0 0 3       Home Medications    Prior to Admission medications   Medication Sig Start Date End Date Taking? Authorizing Provider  acetaminophen (TYLENOL) 500 MG tablet Take 1,000 mg by mouth every 6 (six) hours as needed for mild pain, moderate pain, fever or headache.     Historical Provider, MD  busPIRone (BUSPAR) 15 MG tablet Take 15 mg by mouth every evening.     Historical Provider, MD  DULoxetine (CYMBALTA) 60 MG capsule Take 60 mg by mouth every evening.     Historical Provider, MD  fluvoxaMINE (  LUVOX) 100 MG tablet Take 100 mg by mouth every evening.     Historical Provider, MD  glipiZIDE (GLUCOTROL XL) 10 MG 24 hr tablet Take 10 mg by mouth every evening.     Historical Provider, MD  hydrochlorothiazide (HYDRODIURIL) 25 MG tablet Take 25 mg by mouth every evening.     Historical Provider, MD  metFORMIN (GLUCOPHAGE) 500 MG tablet Take 500 mg by mouth 2 (two) times daily with a meal.    Historical Provider, MD  ondansetron (ZOFRAN) 4 MG tablet Take 1 tablet (4 mg total) by mouth every 8 (eight) hours as needed for nausea or vomiting.  04/13/16   Ulice Bold, NP  paliperidone (INVEGA SUSTENNA) 234 MG/1.5ML SUSP injection Inject 234 mg into the muscle every 30 (thirty) days.    Historical Provider, MD    Family History Family History  Problem Relation Age of Onset  . Hypertension Mother   . Diabetes Father     Social History Social History  Substance Use Topics  . Smoking status: Current Every Day Smoker    Packs/day: 1.00    Years: 7.00    Types: Cigarettes    Last attempt to quit: 06/30/2015  . Smokeless tobacco: Never Used  . Alcohol use No     Allergies   Wellbutrin [bupropion]; Omnipaque [iohexol]; and Penicillins   Review of Systems Review of Systems  Musculoskeletal: Positive for arthralgias (bilateral feet and hands). Negative for joint swelling.  Skin: Negative for color change and wound.  Neurological:       +Tingling to bilateral feet and hands     Physical Exam Updated Vital Signs BP 139/67 (BP Location: Left Arm)   Pulse (!) 105   Temp 99 F (37.2 C) (Oral)   Resp 18   Ht 5\' 7"  (1.702 m)   Wt 268 lb (121.6 kg)   SpO2 93%   BMI 41.97 kg/m   Physical Exam  Constitutional: She appears well-developed and well-nourished.  Well appearing  HENT:  Head: Normocephalic and atraumatic.  Nose: Nose normal.  Eyes: Conjunctivae and EOM are normal.  Neck: Normal range of motion.  Cardiovascular: Normal rate and intact distal pulses.   Pulmonary/Chest: Effort normal. No respiratory distress.  Normal work of breathing. No respiratory distress noted.   Abdominal: Soft.  Musculoskeletal: Normal range of motion.  Knees and feet all without redness, bruising, wound, deformity, or TTP. Free ROM of all lower joints bilaterally. Sensation intact to BLE. Muscle strength intact to BLE. Intact distal pulses.   Neurological: She is alert.  Patient able to stand and ambulate without difficulty.  Skin: Skin is warm.  Psychiatric: She has a normal mood and affect. Her behavior is normal.  Nursing  note and vitals reviewed.    ED Treatments / Results  DIAGNOSTIC STUDIES: Oxygen Saturation is 93% on RA, low by my interpretation.    COORDINATION OF CARE: 4:29 PM Discussed treatment plan with pt at bedside and pt agreed to plan.  Procedures Procedures (including critical care time)  Medications Ordered in ED Medications - No data to display   Initial Impression / Assessment and Plan / ED Course  I have reviewed the triage vital signs and the nursing notes.  Patient here with likely neuropathic pain. She has bilateral hand and foot pain and tingling. No history of trauma . Low suspicion for infectious process, fracture or dislocation this time. She has arguments seen by her primary care provider and is in treatment for using gabapentin.  She admits to not taking her gabapentin daily as prescribed. Patient here with a normal bilateral knee exam and foot exam. She was able to stand and ambulate without difficulty. She is in no apparent distress, afebrile, hemodynamically stable. Patient is to follow-up with her primary care provider in one to 2 weeks regarding today's visit. The patient verbally understood and agrees with assessment and plan.   Final Clinical Impressions(s) / ED Diagnoses   Final diagnoses:  Right hand pain  Left hand pain  Foot pain, left  Right foot pain    New Prescriptions New Prescriptions   No medications on file   I personally performed the services described in this documentation, which was scribed in my presence. The recorded information has been reviewed and is accurate.    [place], [place] 08/18/16 [place], MD 08/20/16 [phone]

## 2016-08-18 NOTE — Discharge Instructions (Signed)
Please take Tylenol as needed for pain. Use cold and warm compresses to the area. Continue taking her daily medications. Please follow-up with your primary care provider in 1-2 weeks regarding today's visit.   Contact a health care provider if: Your pain is getting worse. Your pain is not relieved with medicines. You lose function in the area of the pain if the pain is in your arms, legs, or neck. Contact a health care provider if: Your pain treatments are not helping. You are having side effects from your medicines. You are struggling with fatigue, mood changes, depression, or anxiety.

## 2016-08-18 NOTE — ED Triage Notes (Signed)
Patient c/o bilateral hand and foot pain. Patient was prescribed gabapentin to be taken daily, but reports that she has only been taking prn and last took yesterday.

## 2016-08-31 ENCOUNTER — Emergency Department (HOSPITAL_COMMUNITY): Payer: Medicaid Other

## 2016-08-31 ENCOUNTER — Encounter (HOSPITAL_COMMUNITY): Payer: Self-pay | Admitting: Emergency Medicine

## 2016-08-31 ENCOUNTER — Emergency Department (HOSPITAL_COMMUNITY)
Admission: EM | Admit: 2016-08-31 | Discharge: 2016-08-31 | Disposition: A | Discharge status: Home / Self Care | Payer: Medicaid Other | Attending: Emergency Medicine | Admitting: Emergency Medicine

## 2016-08-31 ENCOUNTER — Other Ambulatory Visit: Payer: Self-pay

## 2016-08-31 DIAGNOSIS — R072 Precordial pain: Secondary | ICD-10-CM | POA: Insufficient documentation

## 2016-08-31 DIAGNOSIS — F17210 Nicotine dependence, cigarettes, uncomplicated: Secondary | ICD-10-CM | POA: Diagnosis not present

## 2016-08-31 DIAGNOSIS — Z79899 Other long term (current) drug therapy: Secondary | ICD-10-CM | POA: Insufficient documentation

## 2016-08-31 DIAGNOSIS — Z7984 Long term (current) use of oral hypoglycemic drugs: Secondary | ICD-10-CM | POA: Insufficient documentation

## 2016-08-31 DIAGNOSIS — I10 Essential (primary) hypertension: Secondary | ICD-10-CM | POA: Diagnosis not present

## 2016-08-31 DIAGNOSIS — E119 Type 2 diabetes mellitus without complications: Secondary | ICD-10-CM | POA: Insufficient documentation

## 2016-08-31 LAB — BASIC METABOLIC PANEL
ANION GAP: 8 (ref 5–15)
BUN: 11 mg/dL (ref 6–20)
CALCIUM: 9.3 mg/dL (ref 8.9–10.3)
CO2: 27 mmol/L (ref 22–32)
Chloride: 105 mmol/L (ref 101–111)
Creatinine, Ser: 0.83 mg/dL (ref 0.44–1.00)
GFR calc Af Amer: >60 mL/min (ref >60)
GLUCOSE: 206 mg/dL — AB (ref 65–99)
POTASSIUM: 4.2 mmol/L (ref 3.5–5.1)
Sodium: 140 mmol/L (ref 135–145)

## 2016-08-31 LAB — CBC
HCT: 39 % (ref 36.0–46.0)
HEMOGLOBIN: 12.5 g/dL (ref 12.0–15.0)
MCH: 26.5 pg (ref 26.0–34.0)
MCHC: 32.1 g/dL (ref 30.0–36.0)
MCV: 82.6 fL (ref 78.0–100.0)
Platelets: 206 10*3/uL (ref 150–400)
RBC: 4.72 MIL/uL (ref 3.87–5.11)
RDW: 14.8 % (ref 11.5–15.5)
WBC: 7.9 10*3/uL (ref 4.0–10.5)

## 2016-08-31 LAB — I-STAT TROPONIN, ED: TROPONIN I, POC: 0 ng/mL (ref 0.00–0.08)

## 2016-08-31 NOTE — ED Notes (Signed)
Pt stable, understands discharge instructions, and reasons for return.   

## 2016-08-31 NOTE — Discharge Instructions (Signed)
It was our pleasure to provide your ER care today - we hope that you feel better.  Your test results look good/normal.   Take acetaminophen or ibuprofen as need for pain.  If gi symptoms/heartburn, you may try pepcid and maalox as need for symptom relief.  Your blood pressure is high today - continue your blood pressure medicine, limit salt intake, and follow up with your doctor in the next 1-2 weeks.  Follow up with primary care doctor in the next 1-2 weeks.  Return to ER if worse, new symptoms, trouble breathing, other concern.

## 2016-08-31 NOTE — ED Provider Notes (Signed)
[facility] DEPT Provider Note   CSN: [id number] Arrival date & time: 08/31/16  1352   By signing my name below, I, [name], attest that this documentation has been prepared under the direction and in the presence of Lajean Saver, MD. Electronically Signed: [name], ED Scribe. 08/31/16. 5:19 PM.  History   Chief Complaint Chief Complaint  Patient presents with  . Chest Pain    HPI [name] is a 26 y.o. female with a PMHx of mental disorder, DM, HTN, who presents to the Emergency Department BIB EMS complaining of intermittent, sternal CP onset 1 month ago. Symptoms at rest. Moderate. Persistent. No associated sob, nv or diaphoresis. No pleuritic pain. No leg pain or swelling.  No cocaine use. No fam hx premature cad. Pt notes that she was given 324 mg ASA while en route to the ED with relief of her symptoms. She denies cough, fever, chills, abdominal pain, leg swelling, and any other symptoms. Denies cardiac issues or hx of blood clots. She states that she does smoke cigarettes but denies illegal drug use.    The history is provided by the patient. No language interpreter was used.    Past Medical History:  Diagnosis Date  . Anxiety   . Bipolar 1 disorder ([place])   . Cognitive deficits   . Depression   . Diabetes mellitus without complication ([place])   . Hypertension   . Mental disorder   . Obesity     Patient Active Problem List   Diagnosis Date Noted  . Major depressive disorder, recurrent episode, mild ([place]) 05/04/2016  . 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 Learning disability 07/18/2015  . 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 Diabetes mellitus ([place]) 07/18/2015  . 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 Hyperlipidemia 07/18/2015  . Severe episode of recurrent major depressive disorder, without psychotic features ([place])   . Drug overdose   . Cognitive deficits 10/12/2012  . Generalized  anxiety disorder 06/28/2012    Past Surgical History:  Procedure Laterality Date  . CESAREAN SECTION    . CESAREAN SECTION N/A 04/25/2013   Procedure: REPEAT CESAREAN SECTION;  Surgeon: [name], MD;  Location: [facility];  Service: Obstetrics;  Laterality: N/A;  . MASS EXCISION N/A 06/03/2012   Procedure: EXCISION MASS;  Surgeon: [name], MD;  Location: [facility];  Service: ENT;  Laterality: N/A;  Excision uvula mass  . TONSILLECTOMY N/A 06/03/2012   Procedure: TONSILLECTOMY;  Surgeon: [name], MD;  Location: [facility];  Service: ENT;  Laterality: N/A;  . TONSILLECTOMY      OB History    Gravida Para Term Preterm AB Living   3 3 3  0 0 3   SAB TAB Ectopic Multiple Live Births   0 0 0 0 3       Home Medications    Prior to Admission medications   Medication Sig Start Date End Date Taking? Authorizing Provider  acetaminophen (TYLENOL) 500 MG tablet Take 1,000 mg by mouth every 6 (six) hours as needed for mild pain, moderate pain, fever or headache.     [provider]  busPIRone (BUSPAR) 15 MG tablet Take 15 mg by mouth every evening.     [provider]  DULoxetine (CYMBALTA) 60 MG capsule Take 60 mg by mouth every evening.     [provider]  fluvoxaMINE (LUVOX) 100 MG tablet Take 100 mg by mouth every evening.  [provider]  glipiZIDE (GLUCOTROL XL) 10 MG 24 hr tablet Take 10 mg by mouth every evening.     [provider]  hydrochlorothiazide (HYDRODIURIL) 25 MG tablet Take 25 mg by mouth every evening.     [provider]  metFORMIN (GLUCOPHAGE) 500 MG tablet Take 500 mg by mouth 2 (two) times daily with a meal.    [provider]  ondansetron (ZOFRAN) 4 MG tablet Take 1 tablet (4 mg total) by mouth every 8 (eight) hours as needed for nausea or vomiting. 04/13/16   Wojeck, Bernadene Bell, NP  paliperidone (INVEGA SUSTENNA) 234 MG/1.5ML SUSP injection Inject 234 mg into  the muscle every 30 (thirty) days.    [provider]    Family History Family History  Problem Relation Age of Onset  . Hypertension Mother   . Diabetes Father     Social History Social History  Substance Use Topics  . Smoking status: Current Every Day Smoker    Packs/day: 1.00    Years: 7.00    Types: Cigarettes    Last attempt to quit: 06/30/2015  . Smokeless tobacco: Never Used  . Alcohol use No     Allergies   Wellbutrin [bupropion]; Omnipaque [iohexol]; and Penicillins   Review of Systems Review of Systems  Constitutional: Negative for chills and fever.  Respiratory: Negative for cough.   Cardiovascular: Positive for chest pain (sternal). Negative for leg swelling.  Gastrointestinal: Negative for abdominal pain.  All other systems reviewed and are negative.    Physical Exam Updated Vital Signs BP (!) 151/99 (BP Location: Right Arm)   Pulse 95   Temp 98.5 F (36.9 C) (Oral)   Resp 20   Ht 5\' 7"  (1.702 m)   Wt 274 lb (124.3 kg)   SpO2 100%   BMI 42.91 kg/m   Physical Exam  Constitutional: She appears well-developed and well-nourished. No distress.  HENT:  Head: Normocephalic and atraumatic.  Eyes: Conjunctivae are normal. No scleral icterus.  Neck: Neck supple. No tracheal deviation present. No thyromegaly present.  Cardiovascular: Normal rate, regular rhythm, normal heart sounds and intact distal pulses.  Exam reveals no gallop and no friction rub.   No murmur heard. Pulmonary/Chest: Effort normal and breath sounds normal. No respiratory distress. She has no wheezes. She has no rales. She exhibits no tenderness.  Abdominal: Soft. Bowel sounds are normal. She exhibits no distension. There is no tenderness.  Musculoskeletal: Normal range of motion. She exhibits no edema or tenderness.  Neurological: She is alert.  Skin: Skin is warm and dry. No rash noted.  Psychiatric: She has a normal mood and affect. Her behavior is normal.  Nursing note  and vitals reviewed.    ED Treatments / Results  DIAGNOSTIC STUDIES: Oxygen Saturation is 100% on RA, nl by my interpretation.    COORDINATION OF CARE: 5:18 PM Discussed treatment plan with pt at bedside which includes labs, CXR, EKG, and pt agreed to plan.   Labs (all labs ordered are listed, but only abnormal results are displayed) Labs Reviewed  BASIC METABOLIC PANEL - Abnormal; Notable for the following:       Result Value   Glucose, Bld 206 (*)    All other components within normal limits  CBC  I-STAT TROPOININ, ED    ED ECG REPORT   Date: 08/31/2016  Rate: 88  Rhythm: normal sinus rhythm  QRS Axis: normal  Intervals: normal  ST/T Wave abnormalities: normal  Conduction Disutrbances:none  Narrative  Interpretation:   Old EKG Reviewed: unchanged  I have personally reviewed the EKG tracing Dg Chest 2 View  Result Date: 08/31/2016 CLINICAL DATA:  Chest pain EXAM: CHEST  2 VIEW COMPARISON:  January 15, 2016 FINDINGS: Lungs are clear. Heart is upper normal in size with pulmonary vascularity within normal limits. No adenopathy. No bone lesions. No pneumothorax. IMPRESSION: No edema or consolidation. Electronically Signed   By: Lowella Grip III M.D.   On: 08/31/2016 15:12    Procedures Procedures (including critical care time)  Medications Ordered in ED Medications - No data to display   Initial Impression / Assessment and Plan / ED Course  I have reviewed the triage vital signs and the nursing notes.  Pertinent labs & imaging results that were available during my care of the patient were reviewed by me and considered in my medical decision making (see chart for details).  After symptoms for prolonged period, trop 0. cxr neg.   Patient appears to have non cardiac cp, suspect musculoskeletal or gi related.   rec pcp f/u.     Final Clinical Impressions(s) / ED Diagnoses   Final diagnoses:  None    New Prescriptions New Prescriptions   No  medications on file   I personally performed the services described in this documentation, which was scribed in my presence. The recorded information has been reviewed and considered. Lajean Saver, MD     Lajean Saver, MD 08/31/16 [phone]

## 2016-08-31 NOTE — ED Triage Notes (Signed)
Pt to ER for one month of central chest pain that is intermittent in nature. Given 324 mg aspirin by EMS. VSS. CBG 201. Pt a/o x4.

## 2016-09-04 ENCOUNTER — Encounter (HOSPITAL_COMMUNITY): Payer: Self-pay | Admitting: Emergency Medicine

## 2016-09-04 ENCOUNTER — Emergency Department (HOSPITAL_COMMUNITY)
Admission: EM | Admit: 2016-09-04 | Discharge: 2016-09-04 | Disposition: A | Discharge status: Home / Self Care | Payer: Medicaid Other | Attending: Emergency Medicine | Admitting: Emergency Medicine

## 2016-09-04 DIAGNOSIS — E119 Type 2 diabetes mellitus without complications: Secondary | ICD-10-CM | POA: Insufficient documentation

## 2016-09-04 DIAGNOSIS — I10 Essential (primary) hypertension: Secondary | ICD-10-CM | POA: Insufficient documentation

## 2016-09-04 DIAGNOSIS — F17210 Nicotine dependence, cigarettes, uncomplicated: Secondary | ICD-10-CM | POA: Insufficient documentation

## 2016-09-04 DIAGNOSIS — R112 Nausea with vomiting, unspecified: Secondary | ICD-10-CM | POA: Diagnosis not present

## 2016-09-04 DIAGNOSIS — Z7984 Long term (current) use of oral hypoglycemic drugs: Secondary | ICD-10-CM | POA: Insufficient documentation

## 2016-09-04 DIAGNOSIS — R110 Nausea: Secondary | ICD-10-CM

## 2016-09-04 DIAGNOSIS — R1110 Vomiting, unspecified: Secondary | ICD-10-CM

## 2016-09-04 DIAGNOSIS — Z79899 Other long term (current) drug therapy: Secondary | ICD-10-CM | POA: Insufficient documentation

## 2016-09-04 LAB — URINALYSIS, ROUTINE W REFLEX MICROSCOPIC
Bacteria, UA: NONE SEEN
Bilirubin Urine: NEGATIVE
Glucose, UA: NEGATIVE mg/dL
KETONES UR: 5 mg/dL — AB
LEUKOCYTES UA: NEGATIVE
Nitrite: NEGATIVE
PH: 5 (ref 5.0–8.0)
Protein, ur: >=300 mg/dL — AB
SPECIFIC GRAVITY, URINE: 1.043 — AB (ref 1.005–1.030)

## 2016-09-04 LAB — CBC
HCT: 42.3 % (ref 36.0–46.0)
HEMOGLOBIN: 14 g/dL (ref 12.0–15.0)
MCH: 26.9 pg (ref 26.0–34.0)
MCHC: 33.1 g/dL (ref 30.0–36.0)
MCV: 81.3 fL (ref 78.0–100.0)
PLATELETS: 269 10*3/uL (ref 150–400)
RBC: 5.2 MIL/uL — AB (ref 3.87–5.11)
RDW: 14.5 % (ref 11.5–15.5)
WBC: 12.4 10*3/uL — AB (ref 4.0–10.5)

## 2016-09-04 LAB — COMPREHENSIVE METABOLIC PANEL
ALT: 45 U/L (ref 14–54)
ANION GAP: 14 (ref 5–15)
AST: 71 U/L — ABNORMAL HIGH (ref 15–41)
Albumin: 4.8 g/dL (ref 3.5–5.0)
Alkaline Phosphatase: 44 U/L (ref 38–126)
BUN: 17 mg/dL (ref 6–20)
CHLORIDE: 96 mmol/L — AB (ref 101–111)
CO2: 26 mmol/L (ref 22–32)
CREATININE: 1.16 mg/dL — AB (ref 0.44–1.00)
Calcium: 10 mg/dL (ref 8.9–10.3)
Glucose, Bld: 191 mg/dL — ABNORMAL HIGH (ref 65–99)
POTASSIUM: 3 mmol/L — AB (ref 3.5–5.1)
SODIUM: 136 mmol/L (ref 135–145)
Total Bilirubin: 0.9 mg/dL (ref 0.3–1.2)
Total Protein: 8 g/dL (ref 6.5–8.1)

## 2016-09-04 LAB — LIPASE, BLOOD: Lipase: 23 U/L (ref 11–51)

## 2016-09-04 MED ORDER — ONDANSETRON 4 MG PO TBDP
ORAL_TABLET | ORAL | Status: AC
  Filled 2016-09-04: qty 1

## 2016-09-04 MED ORDER — ONDANSETRON 8 MG PO TBDP: 8.0000 mg | ORAL_TABLET | Freq: Three times a day (TID) | ORAL | 0 refills | Status: DC | PRN

## 2016-09-04 MED ORDER — ONDANSETRON 4 MG PO TBDP
4.0000 mg | ORAL_TABLET | Freq: Once | ORAL | Status: AC | PRN
  Administered 2016-09-04: 4 mg via ORAL

## 2016-09-04 NOTE — ED Triage Notes (Signed)
Reports having n/v/d with abdominal cramping on and off.  Also reports having sore throat and pain going into bil ears.  Started 2 days ago.  Medicated in triage for nausea.

## 2016-09-04 NOTE — ED Provider Notes (Signed)
[facility] DEPT Provider Note   CSN: [id number] Arrival date & time: 09/04/16  0224     History   Chief Complaint Chief Complaint  Patient presents with  . Nausea  . Emesis  . Sore Throat    HPI [name] is a 26 y.o. female.  HPI Patient presents to the emergency room with complaints of nausea vomiting over the past 48 hours with some crampy abdominal pain.  Also reports mild sore throat.  Symptoms began 2 days ago.  She was given Zofran by triage and she feels much better at this time.  She's been able to keep down fluids.  No dysuria or urinary frequency.  Denies fevers and chills.  Does report a few episodes of diarrhea.  No other complaints at this time.  Denies abdominal pain   Past Medical History:  Diagnosis Date  . Anxiety   . Bipolar 1 disorder ([place])   . Cognitive deficits   . Depression   . Diabetes mellitus without complication ([place])   . Hypertension   . Mental disorder   . Obesity     Patient Active Problem List   Diagnosis Date Noted  . Major depressive disorder, recurrent episode, mild ([place]) 05/04/2016  . 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 Learning disability 07/18/2015  . 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 Diabetes mellitus ([employer]) 07/18/2015  . 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 Hyperlipidemia 07/18/2015  . Severe episode of recurrent major depressive disorder, without psychotic features ([place])   . Drug overdose   . Cognitive deficits 10/12/2012  . Generalized anxiety disorder 06/28/2012    Past Surgical History:  Procedure Laterality Date  . CESAREAN SECTION    . CESAREAN SECTION N/A 04/25/2013   Procedure: REPEAT CESAREAN SECTION;  Surgeon: [name], MD;  Location: [facility];  Service: Obstetrics;  Laterality: N/A;  . MASS EXCISION N/A 06/03/2012   Procedure: EXCISION MASS;  Surgeon: [name], MD;  Location: [facility];  Service: ENT;   Laterality: N/A;  Excision uvula mass  . TONSILLECTOMY N/A 06/03/2012   Procedure: TONSILLECTOMY;  Surgeon: [name], MD;  Location: [facility];  Service: ENT;  Laterality: N/A;  . TONSILLECTOMY      OB History    Gravida Para Term Preterm AB Living   3 3 3  0 0 3   SAB TAB Ectopic Multiple Live Births   0 0 0 0 3       Home Medications    Prior to Admission medications   Medication Sig Start Date End Date Taking? Authorizing Provider  acetaminophen (TYLENOL) 500 MG tablet Take 1,000 mg by mouth every 6 (six) hours as needed for mild pain, moderate pain, fever or headache.     [provider]  busPIRone (BUSPAR) 15 MG tablet Take 15 mg by mouth every evening.     [provider]  DULoxetine (CYMBALTA) 60 MG capsule Take 60 mg by mouth every evening.     [provider]  fluvoxaMINE (LUVOX) 100 MG tablet Take 100 mg by mouth every evening.     [provider]  glipiZIDE (GLUCOTROL XL) 10 MG 24 hr tablet Take 10 mg by mouth every evening.     [provider]  hydrochlorothiazide (HYDRODIURIL) 25 MG tablet Take 25 mg by mouth every evening.     [provider]  metFORMIN (GLUCOPHAGE) 500 MG tablet Take 500 mg by mouth 2 (two) times daily  with a meal.    [provider]  ondansetron (ZOFRAN) 4 MG tablet Take 1 tablet (4 mg total) by mouth every 8 (eight) hours as needed for nausea or vomiting. 04/13/16   Wojeck, Bernadene Bell, NP  paliperidone (INVEGA SUSTENNA) 234 MG/1.5ML SUSP injection Inject 234 mg into the muscle every 30 (thirty) days.    [provider]    Family History Family History  Problem Relation Age of Onset  . Hypertension Mother   . Diabetes Father     Social History Social History  Substance Use Topics  . Smoking status: Current Every Day Smoker    Packs/day: 1.00    Years: 7.00    Types: Cigarettes    Last attempt to quit: 06/30/2015  . Smokeless tobacco: Never Used  .  Alcohol use No     Allergies   Wellbutrin [bupropion]; Omnipaque [iohexol]; and Penicillins   Review of Systems Review of Systems  All other systems reviewed and are negative.    Physical Exam Updated Vital Signs BP 137/83 (BP Location: Right Arm)   Pulse (!) 117   Temp 97.6 F (36.4 C) (Oral)   Resp 18   Ht 5\' 7"  (1.702 m)   Wt 274 lb (124.3 kg)   SpO2 98%   BMI 42.91 kg/m   Physical Exam  Constitutional: She is oriented to person, place, and time. She appears well-developed and well-nourished. No distress.  HENT:  Head: Normocephalic and atraumatic.  Eyes: EOM are normal.  Neck: Normal range of motion.  Cardiovascular: Normal rate, regular rhythm and normal heart sounds.   Pulmonary/Chest: Effort normal and breath sounds normal.  Abdominal: Soft. She exhibits no distension. There is no tenderness.  Musculoskeletal: Normal range of motion.  Neurological: She is alert and oriented to person, place, and time.  Skin: Skin is warm and dry.  Psychiatric: She has a normal mood and affect. Judgment normal.  Nursing note and vitals reviewed.    ED Treatments / Results  Labs (all labs ordered are listed, but only abnormal results are displayed) Labs Reviewed  COMPREHENSIVE METABOLIC PANEL - Abnormal; Notable for the following:       Result Value   Potassium 3.0 (*)    Chloride 96 (*)    Glucose, Bld 191 (*)    Creatinine, Ser 1.16 (*)    AST 71 (*)    All other components within normal limits  CBC - Abnormal; Notable for the following:    WBC 12.4 (*)    RBC 5.20 (*)    All other components within normal limits  URINALYSIS, ROUTINE W REFLEX MICROSCOPIC - Abnormal; Notable for the following:    Color, Urine AMBER (*)    APPearance HAZY (*)    Specific Gravity, Urine 1.043 (*)    Hgb urine dipstick MODERATE (*)    Ketones, ur 5 (*)    Protein, ur >=300 (*)    Squamous Epithelial / LPF 0-5 (*)    All other components within normal limits  LIPASE, BLOOD     EKG  EKG Interpretation None       Radiology No results found.  Procedures Procedures (including critical care time)  Medications Ordered in ED Medications  ondansetron (ZOFRAN-ODT) disintegrating tablet 4 mg (4 mg Oral Given 09/04/16 0247)     Initial Impression / Assessment and Plan / ED Course  I have reviewed the triage vital signs and the nursing notes.  Pertinent labs & imaging results that were available during  my care of the patient were reviewed by me and considered in my medical decision making (see chart for details).     Overall well-appearing.  Discharge home in good condition.  Primary care follow-up.  Likely viral illness.  Abdominal exam nontender  Final Clinical Impressions(s) / ED Diagnoses   Final diagnoses:  None    New Prescriptions New Prescriptions   No medications on file     Jola Schmidt, MD 09/04/16 [phone]

## 2016-09-07 ENCOUNTER — Emergency Department (HOSPITAL_COMMUNITY)
Admission: EM | Admit: 2016-09-07 | Discharge: 2016-09-08 | Disposition: A | Discharge status: Home / Self Care | Payer: Medicaid Other | Attending: Dermatology | Admitting: Dermatology

## 2016-09-07 ENCOUNTER — Encounter (HOSPITAL_COMMUNITY): Payer: Self-pay | Admitting: Emergency Medicine

## 2016-09-07 DIAGNOSIS — Z5321 Procedure and treatment not carried out due to patient leaving prior to being seen by health care provider: Secondary | ICD-10-CM | POA: Insufficient documentation

## 2016-09-07 DIAGNOSIS — R112 Nausea with vomiting, unspecified: Secondary | ICD-10-CM | POA: Insufficient documentation

## 2016-09-07 NOTE — ED Triage Notes (Addendum)
Pt from home with complaints of nausea, emesis, and fatigue x 1 week. Pt also has complaints of diarrhea x 1 year. Pt is hypertensive per EMS and has a hx of same. Pt states she has only eaten cake today. Pt denies pain with palpation. Pt has scheduled appt with her PCP tomorrow

## 2016-09-10 ENCOUNTER — Emergency Department (HOSPITAL_COMMUNITY)
Admission: EM | Admit: 2016-09-10 | Discharge: 2016-09-10 | Disposition: A | Discharge status: Home / Self Care | Payer: Medicaid Other | Attending: Emergency Medicine | Admitting: Emergency Medicine

## 2016-09-10 ENCOUNTER — Encounter (HOSPITAL_COMMUNITY): Payer: Self-pay | Admitting: Obstetrics and Gynecology

## 2016-09-10 DIAGNOSIS — Z7984 Long term (current) use of oral hypoglycemic drugs: Secondary | ICD-10-CM | POA: Insufficient documentation

## 2016-09-10 DIAGNOSIS — I10 Essential (primary) hypertension: Secondary | ICD-10-CM | POA: Diagnosis not present

## 2016-09-10 DIAGNOSIS — E119 Type 2 diabetes mellitus without complications: Secondary | ICD-10-CM | POA: Diagnosis not present

## 2016-09-10 DIAGNOSIS — R112 Nausea with vomiting, unspecified: Secondary | ICD-10-CM | POA: Diagnosis present

## 2016-09-10 DIAGNOSIS — Z79899 Other long term (current) drug therapy: Secondary | ICD-10-CM | POA: Diagnosis not present

## 2016-09-10 DIAGNOSIS — F17210 Nicotine dependence, cigarettes, uncomplicated: Secondary | ICD-10-CM | POA: Insufficient documentation

## 2016-09-10 DIAGNOSIS — E876 Hypokalemia: Secondary | ICD-10-CM

## 2016-09-10 LAB — URINALYSIS, ROUTINE W REFLEX MICROSCOPIC
Bilirubin Urine: NEGATIVE
GLUCOSE, UA: NEGATIVE mg/dL
Hgb urine dipstick: NEGATIVE
KETONES UR: NEGATIVE mg/dL
LEUKOCYTES UA: NEGATIVE
NITRITE: NEGATIVE
PH: 5 (ref 5.0–8.0)
Protein, ur: 30 mg/dL — AB
Specific Gravity, Urine: 1.024 (ref 1.005–1.030)

## 2016-09-10 LAB — CBC WITH DIFFERENTIAL/PLATELET
BASOS ABS: 0.1 10*3/uL (ref 0.0–0.1)
Basophils Relative: 1 %
EOS PCT: 4 %
Eosinophils Absolute: 0.3 10*3/uL (ref 0.0–0.7)
HCT: 41.2 % (ref 36.0–46.0)
Hemoglobin: 13.7 g/dL (ref 12.0–15.0)
Lymphocytes Relative: 37 %
Lymphs Abs: 2.9 10*3/uL (ref 0.7–4.0)
MCH: 27.2 pg (ref 26.0–34.0)
MCHC: 33.3 g/dL (ref 30.0–36.0)
MCV: 81.7 fL (ref 78.0–100.0)
MONOS PCT: 8 %
Monocytes Absolute: 0.7 10*3/uL (ref 0.1–1.0)
NEUTROS PCT: 50 %
Neutro Abs: 4 10*3/uL (ref 1.7–7.7)
PLATELETS: 248 10*3/uL (ref 150–400)
RBC: 5.04 MIL/uL (ref 3.87–5.11)
RDW: 14.3 % (ref 11.5–15.5)
WBC: 7.9 10*3/uL (ref 4.0–10.5)

## 2016-09-10 LAB — COMPREHENSIVE METABOLIC PANEL
ALBUMIN: 4.7 g/dL (ref 3.5–5.0)
ALT: 45 U/L (ref 14–54)
AST: 43 U/L — AB (ref 15–41)
Alkaline Phosphatase: 43 U/L (ref 38–126)
Anion gap: 14 (ref 5–15)
BUN: 13 mg/dL (ref 6–20)
CHLORIDE: 95 mmol/L — AB (ref 101–111)
CO2: 30 mmol/L (ref 22–32)
CREATININE: 1.08 mg/dL — AB (ref 0.44–1.00)
Calcium: 9.6 mg/dL (ref 8.9–10.3)
GFR calc Af Amer: >60 mL/min (ref >60)
GFR calc non Af Amer: >60 mL/min (ref >60)
GLUCOSE: 212 mg/dL — AB (ref 65–99)
Potassium: 2.8 mmol/L — ABNORMAL LOW (ref 3.5–5.1)
Sodium: 139 mmol/L (ref 135–145)
Total Bilirubin: 1 mg/dL (ref 0.3–1.2)
Total Protein: 8.3 g/dL — ABNORMAL HIGH (ref 6.5–8.1)

## 2016-09-10 LAB — I-STAT BETA HCG BLOOD, ED (MC, WL, AP ONLY): I-stat hCG, quantitative: <5 m[IU]/mL (ref <5)

## 2016-09-10 LAB — LIPASE, BLOOD: Lipase: 18 U/L (ref 11–51)

## 2016-09-10 MED ORDER — POTASSIUM CHLORIDE 10 MEQ/100ML IV SOLN
10.0000 meq | Freq: Once | INTRAVENOUS | Status: AC
  Administered 2016-09-10: 10 meq via INTRAVENOUS
  Filled 2016-09-10: qty 100

## 2016-09-10 MED ORDER — LORAZEPAM 2 MG/ML IJ SOLN
1.0000 mg | Freq: Once | INTRAMUSCULAR | Status: AC
  Administered 2016-09-10: 1 mg via INTRAVENOUS
  Filled 2016-09-10: qty 1

## 2016-09-10 MED ORDER — DIPHENHYDRAMINE HCL 50 MG/ML IJ SOLN
12.5000 mg | Freq: Once | INTRAMUSCULAR | Status: AC
  Administered 2016-09-10: 12.5 mg via INTRAVENOUS
  Filled 2016-09-10: qty 1

## 2016-09-10 MED ORDER — SODIUM CHLORIDE 0.9 % IV BOLUS (SEPSIS)
1000.0000 mL | Freq: Once | INTRAVENOUS | Status: AC
  Administered 2016-09-10: 1000 mL via INTRAVENOUS

## 2016-09-10 MED ORDER — POTASSIUM CHLORIDE 10 MEQ/100ML IV SOLN
10.0000 meq | INTRAVENOUS | Status: AC
  Administered 2016-09-10 (×2): 10 meq via INTRAVENOUS
  Filled 2016-09-10 (×2): qty 100

## 2016-09-10 MED ORDER — PROCHLORPERAZINE EDISYLATE 5 MG/ML IJ SOLN
10.0000 mg | Freq: Once | INTRAMUSCULAR | Status: AC
  Administered 2016-09-10: 10 mg via INTRAVENOUS
  Filled 2016-09-10: qty 2

## 2016-09-10 MED ORDER — POTASSIUM CHLORIDE CRYS ER 20 MEQ PO TBCR
40.0000 meq | EXTENDED_RELEASE_TABLET | Freq: Once | ORAL | Status: AC
  Administered 2016-09-10: 40 meq via ORAL
  Filled 2016-09-10: qty 2

## 2016-09-10 MED ORDER — SODIUM CHLORIDE 0.9 % IV BOLUS (SEPSIS)
2000.0000 mL | Freq: Once | INTRAVENOUS | Status: AC
  Administered 2016-09-10: 2000 mL via INTRAVENOUS

## 2016-09-10 MED ORDER — HALOPERIDOL LACTATE 5 MG/ML IJ SOLN
2.0000 mg | Freq: Once | INTRAMUSCULAR | Status: DC
  Filled 2016-09-10: qty 1

## 2016-09-10 MED ORDER — ONDANSETRON HCL 4 MG/2ML IJ SOLN
4.0000 mg | Freq: Once | INTRAMUSCULAR | Status: AC
  Administered 2016-09-10: 4 mg via INTRAVENOUS
  Filled 2016-09-10: qty 2

## 2016-09-10 MED ORDER — PROMETHAZINE HCL 25 MG/ML IJ SOLN
25.0000 mg | Freq: Once | INTRAMUSCULAR | Status: AC
  Administered 2016-09-10: 25 mg via INTRAVENOUS
  Filled 2016-09-10: qty 1

## 2016-09-10 MED ORDER — PROMETHAZINE HCL 25 MG PO TABS: 25.0000 mg | ORAL_TABLET | Freq: Four times a day (QID) | ORAL | 0 refills | Status: DC | PRN

## 2016-09-10 NOTE — ED Notes (Signed)
Bed: WA14 Expected date:  Expected time:  Means of arrival:  Comments: EMS-abdominal pain 

## 2016-09-10 NOTE — ED Provider Notes (Signed)
[facility] DEPT Provider Note   CSN: [id number] Arrival date & time: 09/10/16  1226     History   Chief Complaint Chief Complaint  Patient presents with  . Abdominal Pain  . Nausea  . Emesis    HPI [name] is a 26 y.o. female history of diabetes, bipolar, hypertension, chronic abdominal pain, here presenting with vomiting, abdominal pain. Patient states that this is a chronic problem and she has been to her doctor multiple times for this. She was most recently in the hospital about a week ago and was diagnosed with chronic abdominal pain with vomiting. Patient was sent home with Zofran. States that she's been vomiting numerous times today. Also has diffuse abdominal cramping. Denies any diarrhea or fever today. Of note, patient has multiple ED visits and multiple CT and ultrasound that was unremarkable.  The history is provided by the patient.    Past Medical History:  Diagnosis Date  . Anxiety   . Bipolar 1 disorder ([place])   . Cognitive deficits   . Depression   . Diabetes mellitus without complication ([place])   . Hypertension   . Mental disorder   . Obesity     Patient Active Problem List   Diagnosis Date Noted  . Major depressive disorder, recurrent episode, mild ([place]) 05/04/2016  . 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 Learning disability 07/18/2015  . 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 Diabetes mellitus ([place]) 07/18/2015  . 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 Hyperlipidemia 07/18/2015  . Severe episode of recurrent major depressive disorder, without psychotic features ([place])   . Drug overdose   . Cognitive deficits 10/12/2012  . Generalized anxiety disorder 06/28/2012    Past Surgical History:  Procedure Laterality Date  . CESAREAN SECTION    . CESAREAN SECTION N/A 04/25/2013   Procedure: REPEAT CESAREAN SECTION;  Surgeon: [name], MD;  Location: [facility];  Service: Obstetrics;   Laterality: N/A;  . MASS EXCISION N/A 06/03/2012   Procedure: EXCISION MASS;  Surgeon: [name], MD;  Location: [facility];  Service: ENT;  Laterality: N/A;  Excision uvula mass  . TONSILLECTOMY N/A 06/03/2012   Procedure: TONSILLECTOMY;  Surgeon: [name], MD;  Location: [facility];  Service: ENT;  Laterality: N/A;  . TONSILLECTOMY      OB History    Gravida Para Term Preterm AB Living   3 3 3  0 0 3   SAB TAB Ectopic Multiple Live Births   0 0 0 0 3       Home Medications    Prior to Admission medications   Medication Sig Start Date End Date Taking? Authorizing Provider  busPIRone (BUSPAR) 15 MG tablet Take 15 mg by mouth every evening.    Yes [provider]  DULoxetine (CYMBALTA) 60 MG capsule Take 60 mg by mouth every evening.    Yes [provider]  fluvoxaMINE (LUVOX) 100 MG tablet Take 100 mg by mouth every evening.    Yes [provider]  glipiZIDE (GLUCOTROL XL) 10 MG 24 hr tablet Take 10 mg by mouth every evening.    Yes [provider]  hydrochlorothiazide (HYDRODIURIL) 25 MG tablet Take 25 mg by mouth every evening.    Yes [provider]  metFORMIN (GLUCOPHAGE) 500 MG tablet Take 500 mg by mouth 2 (two) times daily with a meal.   Yes [provider]  paliperidone (INVEGA SUSTENNA) 234 MG/1.5ML SUSP injection Inject  234 mg into the muscle every 30 (thirty) days.   Yes [provider]  ondansetron (ZOFRAN ODT) 8 MG disintegrating tablet Take 1 tablet (8 mg total) by mouth every 8 (eight) hours as needed for nausea or vomiting. Patient not taking: Reported on 09/10/2016 09/04/16   Jola Schmidt, MD  ondansetron (ZOFRAN) 4 MG tablet Take 1 tablet (4 mg total) by mouth every 8 (eight) hours as needed for nausea or vomiting. Patient not taking: Reported on 09/10/2016 04/13/16   Wojeck, Bernadene Bell, NP    Family History Family History  Problem Relation Age of Onset  .  Hypertension Mother   . Diabetes Father     Social History Social History  Substance Use Topics  . Smoking status: Current Every Day Smoker    Packs/day: 1.00    Years: 7.00    Types: Cigarettes    Last attempt to quit: 06/30/2015  . Smokeless tobacco: Never Used  . Alcohol use No     Allergies   Wellbutrin [bupropion]; Omnipaque [iohexol]; and Penicillins   Review of Systems Review of Systems  Gastrointestinal: Positive for abdominal pain and vomiting.  All other systems reviewed and are negative.    Physical Exam Updated Vital Signs BP 117/61   Pulse (!) 109   Temp 98.2 F (36.8 C) (Oral)   Resp 14   SpO2 97%   Physical Exam  Constitutional: She is oriented to person, place, and time.  Vomiting, uncomfortable   HENT:  Head: Normocephalic.  MM dry   Eyes: EOM are normal. Pupils are equal, round, and reactive to light.  Neck: Normal range of motion. Neck supple.  Cardiovascular: Normal rate, regular rhythm and normal heart sounds.   Pulmonary/Chest: Effort normal and breath sounds normal. No respiratory distress. She has no wheezes.  Abdominal: Soft. Bowel sounds are normal.  + epigastric tenderness, no rebound   Musculoskeletal: Normal range of motion.  Neurological: She is alert and oriented to person, place, and time.  Skin: Skin is warm.  Psychiatric: She has a normal mood and affect.  Nursing note and vitals reviewed.    ED Treatments / Results  Labs (all labs ordered are listed, but only abnormal results are displayed) Labs Reviewed  COMPREHENSIVE METABOLIC PANEL - Abnormal; Notable for the following:       Result Value   Potassium 2.8 (*)    Chloride 95 (*)    Glucose, Bld 212 (*)    Creatinine, Ser 1.08 (*)    Total Protein 8.3 (*)    AST 43 (*)    All other components within normal limits  URINALYSIS, ROUTINE W REFLEX MICROSCOPIC - Abnormal; Notable for the following:    APPearance CLOUDY (*)    Protein, ur 30 (*)    Bacteria, UA FEW (*)     Squamous Epithelial / LPF 6-30 (*)    All other components within normal limits  CBC WITH DIFFERENTIAL/PLATELET  LIPASE, BLOOD  I-STAT BETA HCG BLOOD, ED ([facility], [facility], [facility] ONLY)    EKG  EKG Interpretation None       Radiology No results found.  Procedures Procedures (including critical care time)  Medications Ordered in ED Medications  potassium chloride 10 mEq in 100 mL IVPB (0 mEq Intravenous Stopped 09/10/16 1556)  sodium chloride 0.9 % bolus 1,000 mL (not administered)  sodium chloride 0.9 % bolus 2,000 mL (2,000 mLs Intravenous New Bag/Given 09/10/16 1258)  promethazine (PHENERGAN) injection 25 mg (25 mg Intravenous Given 09/10/16 1305)  ondansetron [facility]) injection 4 mg (4 mg Intravenous Given 09/10/16 1305)  LORazepam (ATIVAN) injection 1 mg (1 mg Intravenous Given 09/10/16 1303)  potassium chloride SA (K-DUR,KLOR-CON) CR tablet 40 mEq (40 mEq Oral Given 09/10/16 1538)  potassium chloride 10 mEq in 100 mL IVPB (10 mEq Intravenous New Bag/Given 09/10/16 1537)  prochlorperazine (COMPAZINE) injection 10 mg (10 mg Intravenous Given 09/10/16 1546)  diphenhydrAMINE (BENADRYL) injection 12.5 mg (12.5 mg Intravenous Given 09/10/16 1546)     Initial Impression / Assessment and Plan / ED Course  I have reviewed the triage vital signs and the nursing notes.  Pertinent labs & imaging results that were available during my care of the patient were reviewed by me and considered in my medical decision making (see chart for details).     [name] is a 26 y.o. female here with vomiting, abdominal pain. This is a chronic problem and she saw her doctor multiple times for similar symptoms and had multiple imaging studies that were negative. Will get CBC, CMP, lipase. Will hydrate and give antiemetic and reassess.   4:37 PM K 2.8. Vomited up potassium tablet. Ordered 3 K runs. Still mildly tachy. Will give more IVF, compazine. UA showed no obvious UTI. Signed out to Dr. Wilson Singer to PO trial  and reassess patient after IVF. Anticipate dc home with phenergan if patient can PO trial.   Final Clinical Impressions(s) / ED Diagnoses   Final diagnoses:  None    New Prescriptions New Prescriptions   No medications on file     Drenda Freeze, MD 09/10/16 [phone]

## 2016-09-10 NOTE — ED Triage Notes (Signed)
Per [facility], patient from home, c/o abdominal pain and N/V x1 week. Denies diarrhea and urinary sx.  BP 122/90 HR 110 O2 99 RR 18 CBG 207

## 2016-09-10 NOTE — Discharge Instructions (Addendum)
Take phenergan as needed for vomiting.   Stay hydrated.   Your potassium is low. Repeat with your doctor in a week  Return to ER if you have severe abdominal pain, vomiting, fevers.

## 2016-09-10 NOTE — ED Notes (Signed)
Patient given water

## 2016-09-10 NOTE — ED Notes (Signed)
ED Provider at bedside. 

## 2016-09-10 NOTE — ED Notes (Signed)
Pt. Ready to be discharged, pt. pulled IV apart, RN,Spencer made aware.

## 2016-09-10 NOTE — ED Provider Notes (Signed)
7:07 PM Resting comfortably. Has drank some water and gingerale. She says she is feeling better. No new complaints. Last run of K infusing. Anticipate DC once completed.    Virgel Manifold, MD 09/10/16 Einar Crow

## 2016-09-15 ENCOUNTER — Emergency Department (HOSPITAL_COMMUNITY)
Admission: EM | Admit: 2016-09-15 | Discharge: 2016-09-15 | Disposition: A | Discharge status: Home / Self Care | Payer: Medicaid Other | Attending: Physician Assistant | Admitting: Physician Assistant

## 2016-09-15 ENCOUNTER — Encounter (HOSPITAL_COMMUNITY): Payer: Self-pay | Admitting: Emergency Medicine

## 2016-09-15 DIAGNOSIS — I10 Essential (primary) hypertension: Secondary | ICD-10-CM | POA: Insufficient documentation

## 2016-09-15 DIAGNOSIS — R197 Diarrhea, unspecified: Secondary | ICD-10-CM | POA: Diagnosis not present

## 2016-09-15 DIAGNOSIS — E119 Type 2 diabetes mellitus without complications: Secondary | ICD-10-CM | POA: Insufficient documentation

## 2016-09-15 DIAGNOSIS — Z79899 Other long term (current) drug therapy: Secondary | ICD-10-CM | POA: Insufficient documentation

## 2016-09-15 DIAGNOSIS — R112 Nausea with vomiting, unspecified: Secondary | ICD-10-CM | POA: Diagnosis not present

## 2016-09-15 DIAGNOSIS — Z7984 Long term (current) use of oral hypoglycemic drugs: Secondary | ICD-10-CM | POA: Insufficient documentation

## 2016-09-15 DIAGNOSIS — F17210 Nicotine dependence, cigarettes, uncomplicated: Secondary | ICD-10-CM | POA: Insufficient documentation

## 2016-09-15 DIAGNOSIS — R110 Nausea: Secondary | ICD-10-CM

## 2016-09-15 NOTE — Discharge Instructions (Signed)
Please read and follow all provided instructions.  Your diagnoses today include:  1. Nausea without vomiting     Tests performed today include: Vital signs. See below for your results today.   Medications prescribed:  Take as prescribed. You can take Zofran every 8 hours   Home care instructions:  Follow any educational materials contained in this packet.  Follow-up instructions: Please follow-up with your primary care provider for further evaluation of symptoms and treatment   Return instructions:  Please return to the Emergency Department if you do not get better, if you get worse, or new symptoms OR  - Fever (temperature greater than 101.19F)  - Bleeding that does not stop with holding pressure to the area    -Severe pain (please note that you may be more sore the day after your accident)  - Chest Pain  - Difficulty breathing  - Severe nausea or vomiting  - Inability to tolerate food and liquids  - Passing out  - Skin becoming red around your wounds  - Change in mental status (confusion or lethargy)  - New numbness or weakness    Please return if you have any other emergent concerns.  Additional Information:  Your vital signs today were: BP (!) 137/93 (BP Location: Right Arm)    Pulse 85    Temp 97.8 F (36.6 C) (Oral)    Resp 15    SpO2 96%  If your blood pressure (BP) was elevated above 135/85 this visit, please have this repeated by your doctor within one month. ---------------

## 2016-09-15 NOTE — ED Triage Notes (Signed)
Patient with nausea and diarrhea with one episode of vomiting.

## 2016-09-15 NOTE — ED Provider Notes (Signed)
[facility] DEPT Provider Note   CSN: [id number] Arrival date & time: 09/15/16  1545  By signing my name below, I, [name], attest that this documentation has been prepared under the direction and in the presence of [name], PA-C. Electronically Signed: Janina [name], Scribe. 09/15/2016. 5:00 PM.   History   Chief Complaint Chief Complaint  Patient presents with  . Nausea    The history is provided by the patient and medical records. No language interpreter was used.   HPI Comments:  [name] is a 26 y.o. female who presents to the Emergency Department complaining of persistent nausea x 1 day. She notes 1 episode of associated vomiting today and diarrhea. Pt was seen in the ED on 09/10/2016 for a similar episode and was found to have low potassium. She received potassium and compazine at that time and discharged home. She states she has been doing well since that visit.  Pt denies abdominal pain. She reports taking both omeprazole, which she just started, and Zofran once today with moderate relief.  Per chart review pt has a care plan in place due to frequent  ED visits for nausea, vomiting, and diarrhea which advises minimal workup as pt has had numerous negative imaging studies.  Past Medical History:  Diagnosis Date  . Anxiety   . Bipolar 1 disorder ([place])   . Cognitive deficits   . Depression   . Diabetes mellitus without complication ([place])   . Hypertension   . Mental disorder   . Obesity     Patient Active Problem List   Diagnosis Date Noted  . Major depressive disorder, recurrent episode, mild ([place]) 05/04/2016  . 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 Learning disability 07/18/2015  . 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 Diabetes mellitus ([place]) 07/18/2015  . 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 Hyperlipidemia 07/18/2015  . Severe episode of recurrent major depressive disorder, without  psychotic features ([place])   . Drug overdose   . Cognitive deficits 10/12/2012  . Generalized anxiety disorder 06/28/2012    Past Surgical History:  Procedure Laterality Date  . CESAREAN SECTION    . CESAREAN SECTION N/A 04/25/2013   Procedure: REPEAT CESAREAN SECTION;  Surgeon: [name], MD;  Location: [facility];  Service: Obstetrics;  Laterality: N/A;  . MASS EXCISION N/A 06/03/2012   Procedure: EXCISION MASS;  Surgeon: [name], MD;  Location: [facility];  Service: ENT;  Laterality: N/A;  Excision uvula mass  . TONSILLECTOMY N/A 06/03/2012   Procedure: TONSILLECTOMY;  Surgeon: [name], MD;  Location: [facility];  Service: ENT;  Laterality: N/A;  . TONSILLECTOMY      OB History    Gravida Para Term Preterm AB Living   3 3 3  0 0 3   SAB TAB Ectopic Multiple Live Births   0 0 0 0 3       Home Medications    Prior to Admission medications   Medication Sig Start Date End Date Taking? Authorizing Provider  busPIRone (BUSPAR) 15 MG tablet Take 15 mg by mouth every evening.     [provider]  DULoxetine (CYMBALTA) 60 MG capsule Take 60 mg by mouth every evening.     [provider]  fluvoxaMINE (LUVOX) 100 MG tablet Take 100 mg by mouth every evening.     [provider]  glipiZIDE (GLUCOTROL XL) 10 MG 24 hr tablet Take 10 mg by mouth every evening.  [provider]  hydrochlorothiazide (HYDRODIURIL) 25 MG tablet Take 25 mg by mouth every evening.     [provider]  metFORMIN (GLUCOPHAGE) 500 MG tablet Take 500 mg by mouth 2 (two) times daily with a meal.    [provider]  ondansetron (ZOFRAN ODT) 8 MG disintegrating tablet Take 1 tablet (8 mg total) by mouth every 8 (eight) hours as needed for nausea or vomiting. Patient not taking: Reported on 09/10/2016 09/04/16   Jola Schmidt, MD  ondansetron (ZOFRAN) 4 MG tablet Take 1 tablet (4 mg total) by mouth every 8 (eight) hours as  needed for nausea or vomiting. Patient not taking: Reported on 09/10/2016 04/13/16   Wojeck, Bernadene Bell, NP  paliperidone (INVEGA SUSTENNA) 234 MG/1.5ML SUSP injection Inject 234 mg into the muscle every 30 (thirty) days.    [provider]  promethazine (PHENERGAN) 25 MG tablet Take 1 tablet (25 mg total) by mouth every 6 (six) hours as needed for nausea or vomiting. 09/10/16   Drenda Freeze, MD    Family History Family History  Problem Relation Age of Onset  . Hypertension Mother   . Diabetes Father     Social History Social History  Substance Use Topics  . Smoking status: Current Every Day Smoker    Packs/day: 1.00    Years: 7.00    Types: Cigarettes    Last attempt to quit: 06/30/2015  . Smokeless tobacco: Never Used  . Alcohol use No     Allergies   Wellbutrin [bupropion]; Omnipaque [iohexol]; and Penicillins   Review of Systems Review of Systems  Constitutional: Negative for fever.  Gastrointestinal: Positive for diarrhea, nausea and vomiting. Negative for abdominal pain.     Physical Exam Updated Vital Signs BP (!) 137/93 (BP Location: Right Arm)   Pulse 85   Temp 97.8 F (36.6 C) (Oral)   Resp 15   SpO2 96%   Physical Exam  Constitutional: She is oriented to person, place, and time. Vital signs are normal. She appears well-developed and well-nourished. No distress.  HENT:  Head: Normocephalic and atraumatic.  Right Ear: Hearing normal.  Left Ear: Hearing normal.  Eyes: Conjunctivae and EOM are normal. Pupils are equal, round, and reactive to light.  Neck: Normal range of motion. Neck supple.  Cardiovascular: Normal rate, regular rhythm, normal heart sounds and intact distal pulses.   Pulmonary/Chest: Effort normal and breath sounds normal.  Abdominal: Soft. Normal appearance and bowel sounds are normal. She exhibits no distension. There is no tenderness. There is no rigidity, no rebound, no guarding, no CVA tenderness, no tenderness at McBurney's  point and negative Murphy's sign.  Musculoskeletal: Normal range of motion.  Neurological: She is alert and oriented to person, place, and time.  Skin: Skin is warm and dry.  Psychiatric: She has a normal mood and affect. Her speech is normal and behavior is normal. Thought content normal.  Nursing note and vitals reviewed.  ED Treatments / Results  DIAGNOSTIC STUDIES:  Oxygen Saturation is 96% on RA, normal by my interpretation.    COORDINATION OF CARE:  5:00 PM Discussed treatment plan with pt at bedside and pt agreed to plan.  Labs (all labs ordered are listed, but only abnormal results are displayed) Labs Reviewed - No data to display  EKG  EKG Interpretation None      Radiology No results found.  Procedures Procedures (including critical care time)  Medications Ordered in ED Medications - No data to display  Initial  Impression / Assessment and Plan / ED Course  I have reviewed the triage vital signs and the nursing notes.  Pertinent labs & imaging results that were available during my care of the patient were reviewed by me and considered in my medical decision making (see chart for details).  {I have reviewed the relevant previous healthcare records.  {I obtained HPI from historian.   ED Course:  Assessment: Pt is a 26 y.o. female with hx DM, HTN, Obesity who presents with nausea x 1 day. No abdominal pain. Seen last week for same. Pt with care plan. Nausea improved with Zofran. No active emesis in ED. On exam, pt in NAD. Nontoxic/nonseptic appearing. VSS. Afebrile. Lungs CTA. Heart RRR. Abdomen nontender soft. Plan is to DC home with follow up to PCP or GI for ongoing chronic abdominal discomofrt. At time of discharge, Patient is in no acute distress. Vital Signs are stable. Patient is able to ambulate. Patient able to tolerate PO.   Marland Kitchen Disposition/Plan:  DC Home Additional Verbal discharge instructions given and discussed with patient.  Pt Instructed to f/u  with PCP in the next week for evaluation and treatment of symptoms. Return precautions given Pt acknowledges and agrees with plan  Supervising Physician Mackuen, Courteney Lyn, *   Final Clinical Impressions(s) / ED Diagnoses   Final diagnoses:  Nausea without vomiting    New Prescriptions New Prescriptions   No medications on file   I personally performed the services described in this documentation, which was scribed in my presence. The recorded information has been reviewed and is accurate.    [name], PA-C 09/15/16 1937    Macarthur Critchley, MD 09/15/16 [phone]

## 2016-09-15 NOTE — ED Triage Notes (Signed)
Per EMS-states chronic nausea-seen last week at this facility-patient has a care plan-no vomiting with EMS-states outside of apartment complex in no distress

## 2016-09-30 ENCOUNTER — Encounter (HOSPITAL_COMMUNITY): Payer: Self-pay | Admitting: Nurse Practitioner

## 2016-09-30 ENCOUNTER — Emergency Department (HOSPITAL_COMMUNITY)
Admission: EM | Admit: 2016-09-30 | Discharge: 2016-10-01 | Disposition: A | Discharge status: Other Acute Inpatient Hospital | Payer: Medicaid Other | Attending: Emergency Medicine | Admitting: Emergency Medicine

## 2016-09-30 DIAGNOSIS — F411 Generalized anxiety disorder: Secondary | ICD-10-CM | POA: Insufficient documentation

## 2016-09-30 DIAGNOSIS — F330 Major depressive disorder, recurrent, mild: Secondary | ICD-10-CM | POA: Diagnosis not present

## 2016-09-30 DIAGNOSIS — Z79899 Other long term (current) drug therapy: Secondary | ICD-10-CM | POA: Insufficient documentation

## 2016-09-30 DIAGNOSIS — I10 Essential (primary) hypertension: Secondary | ICD-10-CM | POA: Insufficient documentation

## 2016-09-30 DIAGNOSIS — R4183 Borderline intellectual functioning: Secondary | ICD-10-CM | POA: Insufficient documentation

## 2016-09-30 DIAGNOSIS — F17210 Nicotine dependence, cigarettes, uncomplicated: Secondary | ICD-10-CM | POA: Diagnosis not present

## 2016-09-30 DIAGNOSIS — E119 Type 2 diabetes mellitus without complications: Secondary | ICD-10-CM | POA: Diagnosis not present

## 2016-09-30 DIAGNOSIS — R45851 Suicidal ideations: Secondary | ICD-10-CM | POA: Diagnosis not present

## 2016-09-30 LAB — COMPREHENSIVE METABOLIC PANEL
ALT: 31 U/L (ref 14–54)
ANION GAP: 9 (ref 5–15)
AST: 36 U/L (ref 15–41)
Albumin: 4.1 g/dL (ref 3.5–5.0)
Alkaline Phosphatase: 41 U/L (ref 38–126)
BUN: 10 mg/dL (ref 6–20)
CHLORIDE: 106 mmol/L (ref 101–111)
CO2: 25 mmol/L (ref 22–32)
Calcium: 9.3 mg/dL (ref 8.9–10.3)
Creatinine, Ser: 0.85 mg/dL (ref 0.44–1.00)
GFR calc Af Amer: >60 mL/min (ref >60)
Glucose, Bld: 129 mg/dL — ABNORMAL HIGH (ref 65–99)
POTASSIUM: 3.8 mmol/L (ref 3.5–5.1)
Sodium: 140 mmol/L (ref 135–145)
Total Bilirubin: 1 mg/dL (ref 0.3–1.2)
Total Protein: 6.8 g/dL (ref 6.5–8.1)

## 2016-09-30 LAB — RAPID URINE DRUG SCREEN, HOSP PERFORMED
AMPHETAMINES: NOT DETECTED
BARBITURATES: NOT DETECTED
Benzodiazepines: NOT DETECTED
Cocaine: NOT DETECTED
Opiates: NOT DETECTED
TETRAHYDROCANNABINOL: NOT DETECTED

## 2016-09-30 LAB — SALICYLATE LEVEL

## 2016-09-30 LAB — CBC
HEMATOCRIT: 38.4 % (ref 36.0–46.0)
Hemoglobin: 12.2 g/dL (ref 12.0–15.0)
MCH: 26 pg (ref 26.0–34.0)
MCHC: 31.8 g/dL (ref 30.0–36.0)
MCV: 81.7 fL (ref 78.0–100.0)
Platelets: 230 10*3/uL (ref 150–400)
RBC: 4.7 MIL/uL (ref 3.87–5.11)
RDW: 14.1 % (ref 11.5–15.5)
WBC: 7.1 10*3/uL (ref 4.0–10.5)

## 2016-09-30 LAB — ETHANOL

## 2016-09-30 LAB — I-STAT BETA HCG BLOOD, ED (MC, WL, AP ONLY)

## 2016-09-30 LAB — ACETAMINOPHEN LEVEL: Acetaminophen (Tylenol), Serum: <10 ug/mL — ABNORMAL LOW (ref 10–30)

## 2016-09-30 MED ORDER — HYDROCHLOROTHIAZIDE 25 MG PO TABS
25.0000 mg | ORAL_TABLET | Freq: Every evening | ORAL | Status: DC
  Administered 2016-09-30: 25 mg via ORAL
  Filled 2016-09-30: qty 1

## 2016-09-30 MED ORDER — ONDANSETRON HCL 4 MG PO TABS: 4.0000 mg | ORAL_TABLET | Freq: Three times a day (TID) | ORAL | Status: DC | PRN

## 2016-09-30 MED ORDER — GLIPIZIDE ER 10 MG PO TB24
10.0000 mg | ORAL_TABLET | Freq: Every evening | ORAL | Status: DC
  Filled 2016-09-30: qty 1

## 2016-09-30 MED ORDER — LORAZEPAM 1 MG PO TABS
1.0000 mg | ORAL_TABLET | Freq: Once | ORAL | Status: AC
  Administered 2016-09-30: 1 mg via ORAL
  Filled 2016-09-30: qty 1

## 2016-09-30 MED ORDER — FLUVOXAMINE MALEATE 50 MG PO TABS
100.0000 mg | ORAL_TABLET | Freq: Every evening | ORAL | Status: DC
  Administered 2016-09-30: 100 mg via ORAL

## 2016-09-30 MED ORDER — METFORMIN HCL 500 MG PO TABS: 500.0000 mg | ORAL_TABLET | Freq: Two times a day (BID) | ORAL | Status: DC

## 2016-09-30 MED ORDER — DULOXETINE HCL 60 MG PO CPEP
60.0000 mg | ORAL_CAPSULE | Freq: Every evening | ORAL | Status: DC
  Administered 2016-09-30: 60 mg via ORAL
  Filled 2016-09-30 (×2): qty 1

## 2016-09-30 MED ORDER — BUSPIRONE HCL 10 MG PO TABS
15.0000 mg | ORAL_TABLET | Freq: Every evening | ORAL | Status: DC
  Administered 2016-09-30: 15 mg via ORAL
  Filled 2016-09-30: qty 2

## 2016-09-30 MED ORDER — PALIPERIDONE PALMITATE 234 MG/1.5ML IM SUSP: 234.0000 mg | INTRAMUSCULAR | Status: DC

## 2016-09-30 MED ORDER — PROMETHAZINE HCL 25 MG PO TABS: 25.0000 mg | ORAL_TABLET | Freq: Four times a day (QID) | ORAL | Status: DC | PRN
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
## 2016-09-30 NOTE — ED Provider Notes (Signed)
[facility] DEPT Provider Note   CSN: [id number] Arrival date & time: 09/30/16  1547  By signing my name below, I, Dora Sims, attest that this documentation has been prepared under the direction and in the presence of [employer], PA-C. Electronically Signed: Dora Sims, Scribe. 09/30/2016. 5:00 PM.  History   Chief Complaint Chief Complaint  Patient presents with  . Suicidal   The history is provided by the patient. No language interpreter was used.    HPI Comments: [name] is a 26 y.o. female with PMHx including anxiety, bipolar 1, depression, HTN, and DM who presents to the Emergency Department voluntarily via police for evaluation of suicidal ideation. Patient states she has been shaving and bleaching her hair frequently for the past year because she feels "odd sensations" in her hair. Patient reports headaches secondary to shaving and bleaching her hair. She also reports that she has been contemplating suicide because she is "tired of this" in reference to the issues she is having with her hair. Her plan is to overdose on medication which she states she has attempted in the past. Patient denies homicidal ideation or any other associated symptoms.  Past Medical History:  Diagnosis Date  . Anxiety   . Bipolar 1 disorder ([place])   . Cognitive deficits   . Depression   . Diabetes mellitus without complication ([place])   . Hypertension   . Mental disorder   . Obesity     Patient Active Problem List   Diagnosis Date Noted  . Major depressive disorder, recurrent episode, mild ([place]) 05/04/2016  . 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 Learning disability 07/18/2015  . 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 Diabetes mellitus ([place]) 07/18/2015  . MDD (major depressive disorder), recurrent, severe, with psychosis ([place]) 07/18/2015  . Hyperlipidemia 07/18/2015  . Severe episode of recurrent major depressive disorder, without psychotic features ([place])   . Drug  overdose   . Cognitive deficits 10/12/2012  . Generalized anxiety disorder 06/28/2012    Past Surgical History:  Procedure Laterality Date  . CESAREAN SECTION    . CESAREAN SECTION N/A 04/25/2013   Procedure: REPEAT CESAREAN SECTION;  Surgeon: [name], MD;  Location: [facility];  Service: Obstetrics;  Laterality: N/A;  . MASS EXCISION N/A 06/03/2012   Procedure: EXCISION MASS;  Surgeon: [name], MD;  Location: [place];  Service: ENT;  Laterality: N/A;  Excision uvula mass  . TONSILLECTOMY N/A 06/03/2012   Procedure: TONSILLECTOMY;  Surgeon: [name], MD;  Location: [place];  Service: ENT;  Laterality: N/A;  . TONSILLECTOMY      OB History    Gravida Para Term Preterm AB Living   3 3 3  0 0 3   SAB TAB Ectopic Multiple Live Births   0 0 0 0 3       Home Medications    Prior to Admission medications   Medication Sig Start Date End Date Taking? Authorizing Provider  busPIRone (BUSPAR) 15 MG tablet Take 15 mg by mouth every evening.    Yes [provider]  DULoxetine (CYMBALTA) 60 MG capsule Take 60 mg by mouth every evening.    Yes [provider]  fluvoxaMINE (LUVOX) 100 MG tablet Take 100 mg by mouth every evening.    Yes [provider]  gabapentin (NEURONTIN) 400 MG capsule Take 400 mg by mouth 2 (two) times daily.   Yes [provider]  glipiZIDE (GLUCOTROL XL) 10 MG 24 hr tablet Take  10 mg by mouth every evening.    Yes [provider]  hydrochlorothiazide (HYDRODIURIL) 25 MG tablet Take 25 mg by mouth every evening.    Yes [provider]  metFORMIN (GLUCOPHAGE) 500 MG tablet Take 500 mg by mouth 2 (two) times daily with a meal.   Yes [provider]  omeprazole (PRILOSEC) 20 MG capsule Take 20 mg by mouth daily.   Yes [provider]  ondansetron (ZOFRAN) 4 MG tablet Take 1 tablet (4 mg total) by mouth every 8 (eight) hours as needed for nausea or  vomiting. 04/13/16  Yes Wojeck, Bernadene Bell, NP  paliperidone (INVEGA SUSTENNA) 234 MG/1.5ML SUSP injection Inject 234 mg into the muscle every 30 (thirty) days.   Yes [provider]  promethazine (PHENERGAN) 25 MG tablet Take 1 tablet (25 mg total) by mouth every 6 (six) hours as needed for nausea or vomiting. 09/10/16  Yes Drenda Freeze, MD  solifenacin (VESICARE) 5 MG tablet Take 5 mg by mouth daily.   Yes [provider]  ondansetron (ZOFRAN ODT) 8 MG disintegrating tablet Take 1 tablet (8 mg total) by mouth every 8 (eight) hours as needed for nausea or vomiting. Patient not taking: Reported on 09/10/2016 09/04/16   Jola Schmidt, MD    Family History Family History  Problem Relation Age of Onset  . Hypertension Mother   . Diabetes Father     Social History Social History  Substance Use Topics  . Smoking status: Current Every Day Smoker    Packs/day: 1.00    Years: 7.00    Types: Cigarettes    Last attempt to quit: 06/30/2015  . Smokeless tobacco: Never Used  . Alcohol use No     Allergies   Wellbutrin [bupropion]; Omnipaque [iohexol]; and Penicillins   Review of Systems Review of Systems  Neurological: Positive for headaches.  Psychiatric/Behavioral: Positive for suicidal ideas.       Negative for homicidal ideas.  All other systems reviewed and are negative.  Physical Exam Updated Vital Signs BP (!) 141/102 (BP Location: Right Arm)   Pulse (!) 102   Temp 98.3 F (36.8 C) (Oral)   Resp 20   SpO2 99%   Physical Exam  Constitutional: She is oriented to person, place, and time. She appears well-developed and well-nourished. No distress.  HENT:  Head: Normocephalic and atraumatic.  Eyes: Conjunctivae and EOM are normal.  Neck: Neck supple. No tracheal deviation present.  Cardiovascular: Normal rate.   Pulmonary/Chest: Effort normal. No respiratory distress.  Musculoskeletal: Normal range of motion.  Neurological: She is alert and oriented to  person, place, and time.  Skin: Skin is warm and dry.  Nursing note and vitals reviewed.  ED Treatments / Results  Labs (all labs ordered are listed, but only abnormal results are displayed) Labs Reviewed  COMPREHENSIVE METABOLIC PANEL - Abnormal; Notable for the following:       Result Value   Glucose, Bld 129 (*)    All other components within normal limits  ACETAMINOPHEN LEVEL - Abnormal; Notable for the following:    Acetaminophen (Tylenol), Serum <10 (*)    All other components within normal limits  ETHANOL  SALICYLATE LEVEL  CBC  RAPID URINE DRUG SCREEN, HOSP PERFORMED  I-STAT BETA HCG BLOOD, ED ([facility], [facility], [facility] ONLY)    EKG  EKG Interpretation None       Radiology No results found.  Procedures Procedures (including critical care time)  DIAGNOSTIC STUDIES: Oxygen Saturation is 99%  on RA, normal by my interpretation.    COORDINATION OF CARE: 4:57 PM Discussed treatment plan with pt at bedside and pt agreed to plan.  Medications Ordered in ED Medications  busPIRone (BUSPAR) tablet 15 mg (15 mg Oral Given 09/30/16 2023)  DULoxetine (CYMBALTA) DR capsule 60 mg (60 mg Oral Given 09/30/16 2023)  fluvoxaMINE (LUVOX) tablet 100 mg (100 mg Oral Given 09/30/16 2031)  glipiZIDE (GLUCOTROL XL) 24 hr tablet 10 mg (not administered)  hydrochlorothiazide (HYDRODIURIL) tablet 25 mg (25 mg Oral Given 09/30/16 2024)  metFORMIN (GLUCOPHAGE) tablet 500 mg (not administered)  ondansetron (ZOFRAN) tablet 4 mg (not administered)  promethazine (PHENERGAN) tablet 25 mg (not administered)  LORazepam (ATIVAN) tablet 1 mg (1 mg Oral Given 09/30/16 2024)     Initial Impression / Assessment and Plan / ED Course  I have reviewed the triage vital signs and the nursing notes.  Pertinent labs & imaging results that were available during my care of the patient were reviewed by me and considered in my medical decision making (see chart for details).      Final Clinical Impressions(s) / ED Diagnoses    Final diagnoses:  None    26 year old female presents today with suicidal ideations.  Patient is medically cleared awaiting TTS consultation  New Prescriptions New Prescriptions   No medications on file  I personally performed the services described in this documentation, which was scribed in my presence. The recorded information has been reviewed and is accurate.   Okey Regal, PA-C 09/30/16 2100    Gareth Morgan, MD 10/02/16 [phone]

## 2016-09-30 NOTE — ED Notes (Signed)
Patient talking to TTS at this time. 

## 2016-09-30 NOTE — ED Notes (Signed)
Belongings inventoried and placed into locker #6 in pod F, valuables with security, Pt signed inventory sheets

## 2016-09-30 NOTE — ED Notes (Signed)
Medications sent to pharmacy

## 2016-09-30 NOTE — ED Notes (Signed)
Patient was given a picture to color and crayons, also A pack of cookies and Drink.

## 2016-09-30 NOTE — ED Triage Notes (Signed)
Pt presents with c/o suicidal thoughts. She reports increased depression and anxiety over the past month. She has been cutting her hair and shaving her head bald daily due to her symptoms. Today she felt so depressed she thought of overdosing on her diabetes medication. She called 911 for help and [employer] transported her voluntarily to the ED.She has been taking her medications as prescribed.

## 2016-09-30 NOTE — ED Notes (Signed)
Pt meets criteria for inpatient placement per TTS eval.  Pt will await placement.

## 2016-09-30 NOTE — ED Notes (Signed)
ED Provider at bedside. 

## 2016-09-30 NOTE — ED Notes (Signed)
Meal tray at bedside.  

## 2016-09-30 NOTE — ED Notes (Signed)
Pt provided with sprite zero

## 2016-09-30 NOTE — ED Notes (Signed)
Dinner tray ordered; regular diet, no sharps

## 2016-09-30 NOTE — ED Notes (Signed)
Pt "nervous" asking for "nerve pill".  Patting herself on the head.  Appears anxious. EDP notified and orders received for 1 mg po ativan.

## 2016-10-01 ENCOUNTER — Encounter (HOSPITAL_COMMUNITY): Payer: Self-pay

## 2016-10-01 ENCOUNTER — Inpatient Hospital Stay (HOSPITAL_COMMUNITY)
Admission: EM | Admit: 2016-10-01 | Discharge: 2016-10-06 | DRG: 885 | Disposition: A | Discharge status: Home / Self Care | Payer: Medicaid Other | Source: Intra-hospital | Attending: Psychiatry | Admitting: Psychiatry

## 2016-10-01 DIAGNOSIS — E119 Type 2 diabetes mellitus without complications: Secondary | ICD-10-CM | POA: Diagnosis present

## 2016-10-01 DIAGNOSIS — Z880 Allergy status to penicillin: Secondary | ICD-10-CM | POA: Diagnosis not present

## 2016-10-01 DIAGNOSIS — F411 Generalized anxiety disorder: Secondary | ICD-10-CM | POA: Diagnosis present

## 2016-10-01 DIAGNOSIS — E785 Hyperlipidemia, unspecified: Secondary | ICD-10-CM | POA: Diagnosis present

## 2016-10-01 DIAGNOSIS — G4700 Insomnia, unspecified: Secondary | ICD-10-CM | POA: Diagnosis not present

## 2016-10-01 DIAGNOSIS — K219 Gastro-esophageal reflux disease without esophagitis: Secondary | ICD-10-CM | POA: Diagnosis present

## 2016-10-01 DIAGNOSIS — R4189 Other symptoms and signs involving cognitive functions and awareness: Secondary | ICD-10-CM | POA: Diagnosis present

## 2016-10-01 DIAGNOSIS — F429 Obsessive-compulsive disorder, unspecified: Secondary | ICD-10-CM | POA: Diagnosis present

## 2016-10-01 DIAGNOSIS — E669 Obesity, unspecified: Secondary | ICD-10-CM | POA: Diagnosis present

## 2016-10-01 DIAGNOSIS — F332 Major depressive disorder, recurrent severe without psychotic features: Secondary | ICD-10-CM | POA: Diagnosis not present

## 2016-10-01 DIAGNOSIS — R45851 Suicidal ideations: Secondary | ICD-10-CM | POA: Diagnosis present

## 2016-10-01 DIAGNOSIS — I10 Essential (primary) hypertension: Secondary | ICD-10-CM | POA: Diagnosis present

## 2016-10-01 DIAGNOSIS — Z6841 Body Mass Index (BMI) 40.0 and over, adult: Secondary | ICD-10-CM

## 2016-10-01 DIAGNOSIS — F17210 Nicotine dependence, cigarettes, uncomplicated: Secondary | ICD-10-CM | POA: Diagnosis not present

## 2016-10-01 DIAGNOSIS — F329 Major depressive disorder, single episode, unspecified: Secondary | ICD-10-CM | POA: Diagnosis present

## 2016-10-01 DIAGNOSIS — Z7984 Long term (current) use of oral hypoglycemic drugs: Secondary | ICD-10-CM | POA: Diagnosis not present

## 2016-10-01 DIAGNOSIS — Z79899 Other long term (current) drug therapy: Secondary | ICD-10-CM | POA: Diagnosis not present

## 2016-10-01 DIAGNOSIS — F419 Anxiety disorder, unspecified: Secondary | ICD-10-CM | POA: Diagnosis not present

## 2016-10-01 LAB — GLUCOSE, CAPILLARY
GLUCOSE-CAPILLARY: 198 mg/dL — AB (ref 65–99)
Glucose-Capillary: 174 mg/dL — ABNORMAL HIGH (ref 65–99)
Glucose-Capillary: 161 mg/dL — ABNORMAL HIGH (ref 65–99)

## 2016-10-01 MED ORDER — GABAPENTIN 400 MG PO CAPS
400.0000 mg | ORAL_CAPSULE | Freq: Two times a day (BID) | ORAL | Status: DC
  Administered 2016-10-01 – 2016-10-06 (×10): 400 mg via ORAL
  Filled 2016-10-01 (×16): qty 1

## 2016-10-01 MED ORDER — BUSPIRONE HCL 15 MG PO TABS
15.0000 mg | ORAL_TABLET | Freq: Two times a day (BID) | ORAL | Status: DC
  Administered 2016-10-01 – 2016-10-06 (×10): 15 mg via ORAL
  Filled 2016-10-01 (×16): qty 1

## 2016-10-01 MED ORDER — ALUM & MAG HYDROXIDE-SIMETH 200-200-20 MG/5ML PO SUSP: 30.0000 mL | ORAL | Status: DC | PRN

## 2016-10-01 MED ORDER — PANTOPRAZOLE SODIUM 40 MG PO TBEC
40.0000 mg | DELAYED_RELEASE_TABLET | Freq: Every day | ORAL | Status: DC
  Administered 2016-10-01 – 2016-10-06 (×6): 40 mg via ORAL
  Filled 2016-10-01 (×9): qty 1

## 2016-10-01 MED ORDER — GLIPIZIDE ER 10 MG PO TB24
10.0000 mg | ORAL_TABLET | Freq: Every evening | ORAL | Status: DC
  Administered 2016-10-01 – 2016-10-05 (×5): 10 mg via ORAL
  Filled 2016-10-01 (×8): qty 1

## 2016-10-01 MED ORDER — METFORMIN HCL 500 MG PO TABS
500.0000 mg | ORAL_TABLET | Freq: Two times a day (BID) | ORAL | Status: DC
Start: 2016-10-01 — End: 2016-10-06
  Administered 2016-10-01 – 2016-10-06 (×10): 500 mg via ORAL
  Filled 2016-10-01 (×16): qty 1

## 2016-10-01 MED ORDER — ACETAMINOPHEN 325 MG PO TABS: 650.0000 mg | ORAL_TABLET | Freq: Four times a day (QID) | ORAL | Status: DC | PRN

## 2016-10-01 MED ORDER — DULOXETINE HCL 60 MG PO CPEP
60.0000 mg | ORAL_CAPSULE | Freq: Every day | ORAL | Status: DC
  Administered 2016-10-01 – 2016-10-06 (×6): 60 mg via ORAL
  Filled 2016-10-01 (×10): qty 1

## 2016-10-01 MED ORDER — ONDANSETRON HCL 4 MG PO TABS
ORAL_TABLET | ORAL | Status: AC
  Administered 2016-10-01: 4 mg
  Filled 2016-10-01: qty 1

## 2016-10-01 MED ORDER — INSULIN ASPART 100 UNIT/ML ~~LOC~~ SOLN
0.0000 [IU] | Freq: Three times a day (TID) | SUBCUTANEOUS | Status: DC
  Administered 2016-10-01 (×2): 3 [IU] via SUBCUTANEOUS
  Administered 2016-10-02: 06:00:00 via SUBCUTANEOUS
  Administered 2016-10-02: 2 [IU] via SUBCUTANEOUS
  Administered 2016-10-02: 5 [IU] via SUBCUTANEOUS
  Administered 2016-10-03 (×2): 3 [IU] via SUBCUTANEOUS
  Administered 2016-10-03: 5 [IU] via SUBCUTANEOUS
  Administered 2016-10-04: 3 [IU] via SUBCUTANEOUS
  Administered 2016-10-04: 5 [IU] via SUBCUTANEOUS
  Administered 2016-10-04 – 2016-10-05 (×4): 8 [IU] via SUBCUTANEOUS
  Administered 2016-10-06: 3 [IU] via SUBCUTANEOUS
  Administered 2016-10-06: 5 [IU] via SUBCUTANEOUS

## 2016-10-01 MED ORDER — DARIFENACIN HYDROBROMIDE ER 7.5 MG PO TB24
7.5000 mg | ORAL_TABLET | Freq: Every day | ORAL | Status: DC
  Administered 2016-10-01 – 2016-10-06 (×6): 7.5 mg via ORAL
  Filled 2016-10-01 (×8): qty 1

## 2016-10-01 MED ORDER — BUSPIRONE HCL 15 MG PO TABS
15.0000 mg | ORAL_TABLET | Freq: Every evening | ORAL | Status: DC
  Filled 2016-10-01: qty 1

## 2016-10-01 MED ORDER — INSULIN ASPART 100 UNIT/ML ~~LOC~~ SOLN
4.0000 [IU] | Freq: Three times a day (TID) | SUBCUTANEOUS | Status: DC
  Administered 2016-10-01 – 2016-10-06 (×16): 4 [IU] via SUBCUTANEOUS

## 2016-10-01 MED ORDER — HYDROCHLOROTHIAZIDE 25 MG PO TABS
25.0000 mg | ORAL_TABLET | Freq: Every evening | ORAL | Status: DC
  Administered 2016-10-01 – 2016-10-05 (×5): 25 mg via ORAL
  Filled 2016-10-01 (×9): qty 1

## 2016-10-01 MED ORDER — NICOTINE POLACRILEX 2 MG MT GUM
2.0000 mg | CHEWING_GUM | OROMUCOSAL | Status: DC | PRN
  Administered 2016-10-01 – 2016-10-05 (×2): 2 mg via ORAL

## 2016-10-01 MED ORDER — MAGNESIUM HYDROXIDE 400 MG/5ML PO SUSP: 30.0000 mL | Freq: Every day | ORAL | Status: DC | PRN

## 2016-10-01 MED ORDER — HYDROXYZINE HCL 25 MG PO TABS
25.0000 mg | ORAL_TABLET | Freq: Four times a day (QID) | ORAL | Status: DC | PRN
  Administered 2016-10-01 – 2016-10-05 (×5): 25 mg via ORAL
  Filled 2016-10-01 (×6): qty 1

## 2016-10-01 MED ORDER — TRAZODONE HCL 50 MG PO TABS
50.0000 mg | ORAL_TABLET | Freq: Every evening | ORAL | Status: DC | PRN
  Administered 2016-10-03 – 2016-10-05 (×3): 50 mg via ORAL
  Filled 2016-10-01 (×5): qty 1

## 2016-10-01 MED ORDER — ONDANSETRON HCL 4 MG PO TABS
4.0000 mg | ORAL_TABLET | Freq: Three times a day (TID) | ORAL | Status: DC | PRN
  Administered 2016-10-03 – 2016-10-05 (×3): 4 mg via ORAL
  Filled 2016-10-01 (×3): qty 1

## 2016-10-01 NOTE — [facility] Group Notes (Signed)
[facility] LCSW Group Therapy 10/01/2016 1:15pm  Type of Therapy: Group Therapy- [facility]  Participation Level: Pt did not attend. In bed sleeping.  [name], MSW, [place] 10/01/2016 4:32 PM

## 2016-10-01 NOTE — Progress Notes (Signed)
D: Pt presents with flat affect and depression mood. Pt observed lying in bed resting while reading a book tonight. Pt endorses passive suicidal thoughts tonight with a plan to OD. Pt verbally contracts for safety. Pt declined to attend group tonight and has remained in her room. Writer offered pt sleep medicine tonight and pt decline. Pt have asked for several snacks tonight. Pt given a bedtime snack along with a sandwich. Pt stated that she's hungry because of her diabetes. CBG checked and noted to be 161.  A: Orders reviewed. Verbal support provided. Pt encouraged to attend group tonight. 15 minute checks performed for safety.  R: Pt receptive to tx.

## 2016-10-01 NOTE — Social Work (Signed)
Referred to [facility] Team, is [place] resident.  Anne Cunningham, LCSW Lead Clinical Social Worker Phone:  [phone]  

## 2016-10-01 NOTE — Progress Notes (Signed)
DAR NOTE: Patient presents with anxious affect and depressed mood. Pt stayed in the bed most f the am.  Denies pain, auditory and visual hallucinations.  Rates depression at 4, hopelessness at 2, and anxiety at 5.  Maintained on routine safety checks.  Medications given as prescribed.  Support and encouragement offered as needed. Patient observed socializing with peers in the dayroom.  Offered no complaint.

## 2016-10-01 NOTE — Progress Notes (Signed)
Pt came to nurses station complaining of increased depression, stating that she is not get the help she need and that she is  tired of being depressed and stated that she was about to go off. Pt started screaming and yelling .  Vistaril 25 mg  prn offered which pt took without difficulties. Pt endorsed SI but contracted for safety. Pt was then transferred 500 hall room 500-1. Will continue to monitor.
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
## 2016-10-01 NOTE — Tx Team (Signed)
Initial Treatment Plan 10/01/2016 1:16 AM Jazae Zara Council TGR:[id number]    PATIENT STRESSORS: Health problems Marital or family conflict   PATIENT STRENGTHS: Curator fund of knowledge Motivation for treatment/growth   PATIENT IDENTIFIED PROBLEMS: Depression  Anxiety   Suicidal ideation  "I don't want to shave my head anymore"  "help with my depression"             DISCHARGE CRITERIA:  Ability to meet basic life and health needs Improved stabilization in mood, thinking, and/or behavior Motivation to continue treatment in a less acute level of care Verbal commitment to aftercare and medication compliance  PRELIMINARY DISCHARGE PLAN: Outpatient therapy Medication management  PATIENT/FAMILY INVOLVEMENT: This treatment plan has been presented to and reviewed with the patient, [name].  The patient and family have been given the opportunity to ask questions and make suggestions.  Windell Moment, RN 10/01/2016, 1:16 AM

## 2016-10-01 NOTE — H&P (Signed)
Psychiatric Admission Assessment Adult  Patient Identification: [name] MRN:  [id number] Date of Evaluation:  10/01/2016 Chief Complaint:   " I have bad nerves " Principal Diagnosis: MDD, recurrent, consider OCD  Diagnosis:   Patient Active Problem List   Diagnosis Date Noted  . MDD (major depressive disorder) [F32.9] 10/01/2016  . Major depressive disorder, recurrent episode, mild ([place]) [F33.0] 05/04/2016  . 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 Learning disability [F81.9] 07/18/2015  . 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 Diabetes mellitus ([place]) [E11.9] 07/18/2015  . MDD (major depressive disorder), recurrent, severe, with psychosis ([place]) [F33.3] 07/18/2015  . Hyperlipidemia [E78.5] 07/18/2015  . Severe episode of recurrent major depressive disorder, without psychotic features ([place]) [F33.2]   . Drug overdose [T50.901A]   . Cognitive deficits [R41.89] 10/12/2012  . Generalized anxiety disorder [F41.1] 06/28/2012   History of Present Illness: 26 year old female, who presented to the ED voluntarily, due to worsening depression and suicidal ideations of overdosing.  She attributes her worsening depression to anxiety, and describes OCD type symptoms. She states she has obsessive thoughts and compulsions regarding shaving her head ( she is bald)and shaves it daily. She also applies bleach to her scalp several times a week. States " I do not want to do it, I want to let my hair grow, but if I don't shave my nerves get really bad ". Associated Signs/Symptoms: Depression Symptoms:  depressed mood, anhedonia, hypersomnia, suicidal thoughts with specific plan, loss of energy/fatigue, describes low sense of self esteem (Hypo) Manic Symptoms:  No.  Anxiety Symptoms:  Reports anxious ruminations about " my hair, I want to stop cutting it " Psychotic Symptoms:  Denies hallucinations, no delusions expressed, but states she avoids people if possible , because "  I think they are going to pick at me because I don't have hair". PTSD Symptoms: Denies  Total Time spent with patient: 45 minutes  Past Psychiatric History: patient has had several prior psychiatric admissions, most recently 3/17. At that time was discharged on Buspar, Luvox, Haldol.  In the past she has been diagnosed with MDD, GAD, Borderline Intellectual Functioning  History of suicide attempt by overdosing, but states this was a " lo/ng time ago" Denies history of psychosis  Is the patient at risk to self? Yes.    Has the patient been a risk to self in the past 6 months? Yes.    Has the patient been a risk to self within the distant past? Yes.    Is the patient a risk to others? No.  Has the patient been a risk to others in the past 6 months? No.  Has the patient been a risk to others within the distant past? No.   Prior Inpatient Therapy:  as above  Prior Outpatient Therapy:  Had been following at [facility] , as per her report  Alcohol Screening: 1. How often do you have a drink containing alcohol?: Never 9. Have you or someone else been injured as a result of your drinking?: No 10. Has a relative or friend or a doctor or another health worker been concerned about your drinking or suggested you cut down?: No Alcohol Use Disorder Identification Test Final Score (AUDIT): 0 Brief Intervention: AUDIT score less than 7 or less-screening does not suggest unhealthy drinking-brief intervention not indicated Substance Abuse History in the last 12 months:  Denies alcohol abuse, denies any drug abuse  Consequences of Substance Abuse: Denies  Previous Psychotropic Medications: Currently  taking Cymbalta, Luvox, Buspar , and [place] every month- states last received Invega dose yesterday 6/6.   Psychological Evaluations: No  Past Medical History:  Past Medical History:  Diagnosis Date  . Anxiety   . Bipolar 1 disorder ([place])   . Cognitive deficits   .  Depression   . Diabetes mellitus without complication ([place])   . Hypertension   . Mental disorder   . Obesity     Past Surgical History:  Procedure Laterality Date  . CESAREAN SECTION    . CESAREAN SECTION N/A 04/25/2013   Procedure: REPEAT CESAREAN SECTION;  Surgeon: [name], MD;  Location: [facility];  Service: Obstetrics;  Laterality: N/A;  . MASS EXCISION N/A 06/03/2012   Procedure: EXCISION MASS;  Surgeon: [name], MD;  Location: [facility];  Service: ENT;  Laterality: N/A;  Excision uvula mass  . TONSILLECTOMY N/A 06/03/2012   Procedure: TONSILLECTOMY;  Surgeon: [name], MD;  Location: [facility];  Service: ENT;  Laterality: N/A;  . TONSILLECTOMY     Family History: occasional , distant relationship with father, lives with mother, has two sisters  Family History  Problem Relation Age of Onset  . Hypertension Mother   . Diabetes Father    Family Psychiatric  History: denies psychiatric illness, denies history of suicides, denies history of drug or alcohol abuse in family  Tobacco Screening: Have you used any form of tobacco in the last 30 days? (Cigarettes, Smokeless Tobacco, Cigars, and/or Pipes): Yes Tobacco use, Select all that apply: 5 or more cigarettes per day Are you interested in Tobacco Cessation Medications?: Yes, will notify MD for an order Counseled patient on smoking cessation including recognizing danger situations, developing coping skills and basic information about quitting provided: Refused/Declined practical counseling Social History: single, lives with mother, has three children - states they were all adopted out , no SO at this time, denies legal issues, she is on disability, manages her own monies  History  Alcohol Use No     History  Drug Use No    Comment: Patient denies    Additional Social History:      Pain Medications: See MAR Prescriptions: See MAR Over the Counter: See MAR History of alcohol /  drug use?: No history of alcohol / drug abuse Longest period of sobriety (when/how long): NA  Allergies:   Allergies  Allergen Reactions  . Wellbutrin [Bupropion] Shortness Of Breath  . Omnipaque [Iohexol] Swelling and Other (See Comments)    Reaction:  Eye swelling  . Penicillins Hives and Other (See Comments)    Has patient had a PCN reaction causing immediate rash, facial/tongue/throat swelling, SOB or lightheadedness with hypotension: No Has patient had a PCN reaction causing severe rash involving mucus membranes or skin necrosis: No Has patient had a PCN reaction that required hospitalization No Has patient had a PCN reaction occurring within the last 10 years: No If all of the above answers are "NO", then may proceed with Cephalosporin use.   Lab Results:  Results for orders placed or performed during the hospital encounter of 10/01/16 (from the past 48 hour(s))  Glucose, capillary     Status: Abnormal   Collection Time: 10/01/16 12:16 PM  Result Value Ref Range   Glucose-Capillary 198 (H) 65 - 99 mg/dL    Blood Alcohol level:  Lab Results  Component Value Date   ETH <5 09/30/2016   ETH <5 16/01/9603    Metabolic Disorder Labs:  Lab  Results  Component Value Date   HGBA1C 7.8 (H) 07/18/2015   MPG 177 07/18/2015   MPG 166 05/11/2015   Lab Results  Component Value Date   PROLACTIN 37.3 (H) 07/18/2015   PROLACTIN 42.0 (H) 05/11/2015   Lab Results  Component Value Date   CHOL 212 (H) 07/18/2015   TRIG 496 (H) 07/18/2015   HDL 40 (L) 07/18/2015   CHOLHDL 5.3 07/18/2015   VLDL UNABLE TO CALCULATE IF TRIGLYCERIDE OVER 400 mg/dL 07/18/2015   LDLCALC UNABLE TO CALCULATE IF TRIGLYCERIDE OVER 400 mg/dL 07/18/2015   LDLCALC 116 (H) 05/11/2015    Current Medications: Current Facility-Administered Medications  Medication Dose Route Frequency Provider Last Rate Last Dose  . acetaminophen (TYLENOL) tablet 650 mg  650 mg Oral Q6H PRN Casia Corti, Myer Peer, MD      . alum &  mag hydroxide-simeth (MAALOX/MYLANTA) 200-200-20 MG/5ML suspension 30 mL  30 mL Oral Q4H PRN Livianna Petraglia, Myer Peer, MD      . busPIRone (BUSPAR) tablet 15 mg  15 mg Oral QPM Nwoko, Agnes I, NP      . darifenacin (ENABLEX) 24 hr tablet 7.5 mg  7.5 mg Oral Daily Nwoko, Agnes I, NP      . gabapentin (NEURONTIN) capsule 400 mg  400 mg Oral BID Nwoko, Agnes I, NP      . glipiZIDE (GLUCOTROL XL) 24 hr tablet 10 mg  10 mg Oral QPM Nwoko, Agnes I, NP      . hydrochlorothiazide (HYDRODIURIL) tablet 25 mg  25 mg Oral QPM Nwoko, Agnes I, NP      . hydrOXYzine (ATARAX/VISTARIL) tablet 25 mg  25 mg Oral Q6H PRN Dara Hoyer, PA-C      . insulin aspart (novoLOG) injection 0-15 Units  0-15 Units Subcutaneous TID WC Lindell Spar I, NP   3 Units at 10/01/16 1227  . insulin aspart (novoLOG) injection 4 Units  4 Units Subcutaneous TID WC Lindell Spar I, NP   4 Units at 10/01/16 1226  . magnesium hydroxide (MILK OF MAGNESIA) suspension 30 mL  30 mL Oral Daily PRN Keandrea Tapley, Myer Peer, MD      . metFORMIN (GLUCOPHAGE) tablet 500 mg  500 mg Oral BID WC Nwoko, Agnes I, NP      . nicotine polacrilex (NICORETTE) gum 2 mg  2 mg Oral PRN Rakesha Dalporto, Myer Peer, MD      . ondansetron (ZOFRAN) tablet 4 mg  4 mg Oral Q8H PRN Nwoko, Agnes I, NP      . pantoprazole (PROTONIX) EC tablet 40 mg  40 mg Oral Daily Nwoko, Agnes I, NP      . traZODone (DESYREL) tablet 50 mg  50 mg Oral QHS PRN Dara Hoyer, PA-C       PTA Medications: Prescriptions Prior to Admission  Medication Sig Dispense Refill Last Dose  . busPIRone (BUSPAR) 15 MG tablet Take 15 mg by mouth every evening.    09/29/2016 at Unknown time  . DULoxetine (CYMBALTA) 60 MG capsule Take 60 mg by mouth every evening.   1 Past Week at Unknown time  . fluvoxaMINE (LUVOX) 100 MG tablet Take 100 mg by mouth every evening.    09/29/2016 at Unknown time  . gabapentin (NEURONTIN) 400 MG capsule Take 400 mg by mouth 2 (two) times daily.   Past Week at Unknown time  . glipiZIDE  (GLUCOTROL XL) 10 MG 24 hr tablet Take 10 mg by mouth every evening.    09/29/2016 at Unknown time  .  hydrochlorothiazide (HYDRODIURIL) 25 MG tablet Take 25 mg by mouth every evening.    Past Month at Unknown time  . metFORMIN (GLUCOPHAGE) 500 MG tablet Take 500 mg by mouth 2 (two) times daily with a meal.   09/29/2016 at Unknown time  . omeprazole (PRILOSEC) 20 MG capsule Take 20 mg by mouth daily.   Past Week at Unknown time  . ondansetron (ZOFRAN ODT) 8 MG disintegrating tablet Take 1 tablet (8 mg total) by mouth every 8 (eight) hours as needed for nausea or vomiting. (Patient not taking: Reported on 09/10/2016) 10 tablet 0 Not Taking at Unknown time  . ondansetron (ZOFRAN) 4 MG tablet Take 1 tablet (4 mg total) by mouth every 8 (eight) hours as needed for nausea or vomiting. 12 tablet 0 09/29/2016 at Unknown time  . paliperidone (INVEGA SUSTENNA) 234 MG/1.5ML SUSP injection Inject 234 mg into the muscle every 30 (thirty) days.   09/30/2016 at Unknown time  . promethazine (PHENERGAN) 25 MG tablet Take 1 tablet (25 mg total) by mouth every 6 (six) hours as needed for nausea or vomiting. 10 tablet 0 09/29/2016 at Unknown time  . solifenacin (VESICARE) 5 MG tablet Take 5 mg by mouth daily.   Past Week at Unknown time    Musculoskeletal: Strength & Muscle Tone: within normal limits Gait & Station: normal Patient leans: N/A  Psychiatric Specialty Exam: Physical Exam  Review of Systems  Constitutional: Negative.   HENT: Negative.   Eyes: Negative.   Respiratory: Negative.   Cardiovascular: Negative.   Gastrointestinal: Negative.   Genitourinary: Negative.   Musculoskeletal: Negative.   Skin: Negative.   Neurological: Negative for seizures.  Endo/Heme/Allergies: Negative.   Psychiatric/Behavioral: Positive for depression and suicidal ideas. The patient is nervous/anxious.     Blood pressure 130/77, pulse 93, temperature 98.6 F (37 C), resp. rate 18, height 5\' 7"  (1.702 m), weight 122.9 kg (271  lb), SpO2 100 %.Body mass index is 42.44 kg/m.  General Appearance: Fairly Groomed  Eye Contact:  Fair  Speech:  monotone  Volume:  Normal  Mood:  depressed , anxious   Affect:  blunted, anxious   Thought Process:  Linear, circumstantial, concrete   Orientation:  Other:  fully alert and attentive   Thought Content:  denies hallucinations, no delusions, ruminative about her hair and feeling compelled to shave it daily in spite of not wanting to do it  Suicidal Thoughts:  No denies any current plan or intention of suicide on unit, contracts for safety  Homicidal Thoughts:  No denies any homicidal ideations   Memory:  recent and remote grossly intact   Judgement:  Fair  Insight:  Lacking  Psychomotor Activity:  Depressed   Concentration:  Concentration: Good and Attention Span: Good  Recall:  Good  Fund of Knowledge:  Good  Language:  Good  Akathisia:  Negative  Handed:  Right  AIMS (if indicated):     Assets:  Desire for Improvement Resilience  ADL's:  Fair   Cognition:  WNL  Sleep:  Number of Hours: 4    Treatment Plan Summary: Daily contact with patient to assess and evaluate symptoms and progress in treatment, Medication management, Plan inpatient admission and medications as below   Observation Level/Precautions:  15 minute checks  Laboratory:  as needed  Psychotherapy:    Medications:  Increase Buspar to 15 mgrs BID, continue Cymbalta 60 mgrs QDAY . As noted, she states she received monthly dose of Invega [place] yesterday.   Consultations:  As needed   Discharge Concerns:  -  Estimated LOS: 5 days   Other:     Physician Treatment Plan for Primary Diagnosis:  MDD  Long Term Goal(s): Improvement in symptoms so as ready for discharge  Short Term Goals: Ability to verbalize feelings will improve, Ability to disclose and discuss suicidal ideas, Ability to demonstrate self-control will improve, Ability to identify and develop effective coping behaviors will improve and  Ability to maintain clinical measurements within normal limits will improve  Physician Treatment Plan for Secondary Diagnosis: Active Problems:   MDD (major depressive disorder)  Long Term Goal(s): Improvement in symptoms so as ready for discharge  Short Term Goals: Ability to verbalize feelings will improve, Ability to disclose and discuss suicidal ideas, Ability to demonstrate self-control will improve, Ability to identify and develop effective coping behaviors will improve and Ability to maintain clinical measurements within normal limits will improve  I certify that inpatient services furnished can reasonably be expected to improve the patient's condition.    Jenne Campus, MD 6/7/201812:37 PM

## 2016-10-01 NOTE — Progress Notes (Signed)
Patient did not attend karaoke group tonight. 

## 2016-10-01 NOTE — BHH Suicide Risk Assessment (Signed)
[facility] Admission Suicide Risk Assessment   Nursing information obtained from:  Patient Demographic factors:  Unemployed Current Mental Status:  Suicidal ideation indicated by patient Loss Factors:  NA Historical Factors:  Impulsivity Risk Reduction Factors:  Living with another person, especially a relative  Total Time spent with patient: 45 minutes Principal Problem:  MDD, Anxiety, consider OCD Diagnosis:   Patient Active Problem List   Diagnosis Date Noted  . MDD (major depressive disorder) [F32.9] 10/01/2016  . Major depressive disorder, recurrent episode, mild ([place]) [F33.0] 05/04/2016  . 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 Learning disability [F81.9] 07/18/2015  . 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 Diabetes mellitus ([place]) [E11.9] 07/18/2015  . MDD (major depressive disorder), recurrent, severe, with psychosis ([place]) [F33.3] 07/18/2015  . Hyperlipidemia [E78.5] 07/18/2015  . Severe episode of recurrent major depressive disorder, without psychotic features ([place]) [F33.2]   . Drug overdose [T50.901A]   . Cognitive deficits [R41.89] 10/12/2012  . Generalized anxiety disorder [F41.1] 06/28/2012    Continued Clinical Symptoms:  Alcohol Use Disorder Identification Test Final Score (AUDIT): 0 The "Alcohol Use Disorders Identification Test", Guidelines for Use in Primary Care, Second Edition.  World Pharmacologist [facility]). Score between 0-7:  no or low risk or alcohol related problems. Score between 8-15:  moderate risk of alcohol related problems. Score between 16-19:  high risk of alcohol related problems. Score 20 or above:  warrants further diagnostic evaluation for alcohol dependence and treatment.   CLINICAL FACTORS:   26 year old female, history of depression, anxiety, history of prior psychiatric admissions, presents due to worsening depression, SI, and reports obsessive ruminations about her hair and feeling compelled to shave her head daily  to avoid worsening anxiety.   Psychiatric Specialty Exam: Physical Exam  ROS  Blood pressure 130/77, pulse 93, temperature 98.6 F (37 C), resp. rate 18, height 5\' 7"  (1.702 m), weight 122.9 kg (271 lb), SpO2 100 %.Body mass index is 42.44 kg/m.  See admit note MSE    COGNITIVE FEATURES THAT CONTRIBUTE TO RISK:  Closed-mindedness and Loss of executive function    SUICIDE RISK:   Moderate:  Frequent suicidal ideation with limited intensity, and duration, some specificity in terms of plans, no associated intent, good self-control, limited dysphoria/symptomatology, some risk factors present, and identifiable protective factors, including available and accessible social support.  PLAN OF CARE: Patient will be admitted to inpatient psychiatric unit for stabilization and safety. Will provide and encourage milieu participation. Provide medication management and maked adjustments as needed.  Will follow daily.    I certify that inpatient services furnished can reasonably be expected to improve the patient's condition.   Jenne Campus, MD 10/01/2016, 1:08 PM

## 2016-10-01 NOTE — Tx Team (Signed)
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 acetaminophen (TYLENOL) tablet 650 mg  650 mg Oral Q6H PRN Cobos, Myer Peer, MD      . alum & mag hydroxide-simeth (MAALOX/MYLANTA) 200-200-20 MG/5ML suspension 30 mL  30 mL Oral Q4H PRN Cobos, Myer Peer, MD      . busPIRone (BUSPAR) tablet 15 mg  15 mg Oral BID Cobos, Fernando A, MD      . darifenacin (ENABLEX) 24 hr tablet 7.5 mg  7.5 mg Oral Daily Nwoko, Agnes I, NP   7.5 mg at 10/01/16 1323  . DULoxetine (CYMBALTA) DR capsule 60 mg  60 mg Oral Daily Cobos, Myer Peer, MD   60 mg at 10/01/16 1323  . gabapentin (NEURONTIN) capsule 400 mg  400 mg Oral BID Nwoko, Agnes I, NP      . glipiZIDE (GLUCOTROL XL) 24 hr tablet 10 mg  10 mg Oral QPM Nwoko, Agnes I, NP      . hydrochlorothiazide (HYDRODIURIL) tablet 25 mg  25 mg Oral QPM Nwoko, Agnes I, NP      . hydrOXYzine (ATARAX/VISTARIL) tablet 25 mg  25 mg Oral Q6H PRN Dara Hoyer, PA-C      . insulin aspart (novoLOG) injection 0-15 Units  0-15 Units Subcutaneous TID WC Lindell Spar I, NP   3 Units at 10/01/16 1227  . insulin aspart (novoLOG) injection 4 Units  4 Units Subcutaneous TID WC Lindell Spar I, NP   4 Units at 10/01/16 1226  . magnesium hydroxide (MILK OF MAGNESIA) suspension 30 mL  30 mL Oral Daily PRN Cobos, Myer Peer, MD      . metFORMIN (GLUCOPHAGE) tablet 500 mg  500 mg Oral BID WC Nwoko, Agnes I, NP      . nicotine polacrilex (NICORETTE) gum 2 mg  2 mg Oral PRN Cobos, Myer Peer, MD      . ondansetron (ZOFRAN) tablet 4 mg  4 mg Oral Q8H PRN Nwoko, Agnes I, NP      . pantoprazole (PROTONIX) EC tablet 40 mg  40 mg Oral Daily Nwoko, Herbert Pun I, NP   40  mg at 10/01/16 1323  . traZODone (DESYREL) tablet 50 mg  50 mg Oral QHS PRN Dara Hoyer, PA-C        PTA Medications: Prescriptions Prior to Admission  Medication Sig Dispense Refill Last Dose  . busPIRone (BUSPAR) 15 MG tablet Take 15 mg by mouth every evening.    09/29/2016 at Unknown time  . DULoxetine (CYMBALTA) 60 MG capsule Take 60 mg by mouth every evening.   1 Past Week at Unknown time  . fluvoxaMINE (LUVOX) 100 MG tablet Take 100 mg by mouth every evening.    09/29/2016 at Unknown time  . gabapentin (NEURONTIN) 400 MG capsule Take 400 mg by mouth 2 (two) times daily.   Past Week at Unknown time  . glipiZIDE (GLUCOTROL XL) 10 MG 24 hr tablet Take 10 mg by mouth every evening.    09/29/2016 at Unknown time  . hydrochlorothiazide (HYDRODIURIL) 25 MG tablet Take 25 mg by mouth every evening.    Past Month at Unknown time  . metFORMIN (GLUCOPHAGE) 500 MG tablet Take 500 mg by mouth 2 (  two) times daily with a meal.   09/29/2016 at Unknown time  . omeprazole (PRILOSEC) 20 MG capsule Take 20 mg by mouth daily.   Past Week at Unknown time  . ondansetron (ZOFRAN ODT) 8 MG disintegrating tablet Take 1 tablet (8 mg total) by mouth every 8 (eight) hours as needed for nausea or vomiting. (Patient not taking: Reported on 09/10/2016) 10 tablet 0 Not Taking at Unknown time  . ondansetron (ZOFRAN) 4 MG tablet Take 1 tablet (4 mg total) by mouth every 8 (eight) hours as needed for nausea or vomiting. 12 tablet 0 09/29/2016 at Unknown time  . paliperidone (INVEGA SUSTENNA) 234 MG/1.5ML SUSP injection Inject 234 mg into the muscle every 30 (thirty) days.   09/30/2016 at Unknown time  . promethazine (PHENERGAN) 25 MG tablet Take 1 tablet (25 mg total) by mouth every 6 (six) hours as needed for nausea or vomiting. 10 tablet 0 09/29/2016 at Unknown time  . solifenacin (VESICARE) 5 MG tablet Take 5 mg by mouth daily.   Past Week at Unknown time    Treatment Modalities: Medication Management, Group therapy, Case  management,  1 to 1 session with clinician, Psychoeducation, Recreational therapy.  Patient Stressors: Health problems Marital or family conflict Patient Strengths: Network engineer for treatment/growth  Physician Treatment Plan for Primary Diagnosis: MDD, recurrent, consider OCD  Long Term Goal(s): Improvement in symptoms so as ready for discharge Short Term Goals: Ability to identify and develop effective coping behaviors will improve Compliance with prescribed medications will improve Ability to identify triggers associated with substance abuse/mental health issues will improve  Medication Management: Evaluate patient's response, side effects, and tolerance of medication regimen.  Therapeutic Interventions: 1 to 1 sessions, Unit Group sessions and Medication administration.  Evaluation of Outcomes: Not Met  Physician Treatment Plan for Secondary Diagnosis: Active Problems:   MDD (major depressive disorder)  Long Term Goal(s): Improvement in symptoms so as ready for discharge  Short Term Goals: Ability to identify and develop effective coping behaviors will improve Compliance with prescribed medications will improve Ability to identify triggers associated with substance abuse/mental health issues will improve  Medication Management: Evaluate patient's response, side effects, and tolerance of medication regimen.  Therapeutic Interventions: 1 to 1 sessions, Unit Group sessions and Medication administration.  Evaluation of Outcomes: Not Met  RN Treatment Plan for Primary Diagnosis: MDD, recurrent, consider OCD  Long Term Goal(s): Knowledge of disease and therapeutic regimen to maintain health will improve  Short Term Goals: Ability to disclose and discuss suicidal ideas and Compliance with prescribed medications will improve  Medication Management: RN will administer medications as ordered by provider, will assess and evaluate patient's  response and provide education to patient for prescribed medication. RN will report any adverse and/or side effects to prescribing provider.  Therapeutic Interventions: 1 on 1 counseling sessions, Psychoeducation, Medication administration, Evaluate responses to treatment, Monitor vital signs and CBGs as ordered, Perform/monitor CIWA, COWS, AIMS and Fall Risk screenings as ordered, Perform wound care treatments as ordered.  Evaluation of Outcomes: Not Met  LCSW Treatment Plan for Primary Diagnosis: MDD, recurrent, consider OCD  Long Term Goal(s): Safe transition to appropriate next level of care at discharge, Engage patient in therapeutic group addressing interpersonal concerns. Short Term Goals: Engage patient in aftercare planning with referrals and resources, Increase ability to appropriately verbalize feelings, Identify triggers associated with mental health/substance abuse issues and Increase skills for wellness and recovery  Therapeutic Interventions: Assess for all discharge needs,  1 to 1 time with Education officer, museum, Explore available resources and support systems, Assess for adequacy in community support network, Educate family and significant other(s) on suicide prevention, Complete Psychosocial Assessment, Interpersonal group therapy.  Evaluation of Outcomes: Not Met  Progress in Treatment: Attending groups: Pt is new to milieu, continuing to assess  Participating in groups: Pt is new to milieu, continuing to assess  Taking medication as prescribed: Yes, MD continues to assess for medication changes as needed Toleration medication: Yes, no side effects reported at this time Family/Significant other contact made: No, CSW assessing for appropriate contact Patient understands diagnosis: Continuing to assess Discussing patient identified problems/goals with staff: Yes Medical problems stabilized or resolved: Yes Denies suicidal/homicidal ideation: No, pt recently admitted with  SI. Issues/concerns per patient self-inventory: None Other: N/A  New problem(s) identified: None identified at this time.   New Short Term/Long Term Goal(s): None identified at this time.   Discharge Plan or Barriers: Pt will return home and follow-up with an outpatient provider.  Reason for Continuation of Hospitalization:  Anxiety  Depression Medication stabilization Suicidal ideation  Estimated Length of Stay: 3-5 days  Attendees: Patient: 10/01/2016 4:13 PM  Physician: Dr. Parke Poisson 10/01/2016 4:13 PM  Nursing: Chrys Racer RN; Opal Sidles, RN 10/01/2016 4:13 PM  RN Care Manager: 10/01/2016 4:13 PM  Social Worker: Matthew Saras, [place] 10/01/2016 4:13 PM  Recreational Therapist:  10/01/2016 4:13 PM  Other: Lindell Spar, NP 10/01/2016 4:13 PM  Other:  10/01/2016 4:13 PM  Other: 10/01/2016 4:13 PM  Scribe for Treatment Team: [name], MSW,LCSWA 10/01/2016 4:13 PM

## 2016-10-01 NOTE — BHH Counselor (Signed)
CSW attempted to complete PSA with pt. Pt was sleeping and requested that CSW come back at a later time.  [name], MSW, [place]

## 2016-10-01 NOTE — Progress Notes (Signed)
[name] is a 26 year old female being voluntarily admitted to [facility] from [facility].  She was brought into the ED by EMS due to suicidal thoughts.  She reported an increase in anxiety and had a plan to OD on her medications.  She denied HI and A/V hallucinations.  She reported that she has been taking her psychiatric medications but believes her medication isn't working. She is diagnosed with Major Depressive Disorder and Generalized Anxiety Disorder.  She has a history of diabetes and hypertension.  She reported that she has been shaving and bleaching her hair because it helps with her anxiety.  She reported having a head ache because "I think it is coming from shaving my head all the time."  She was pleasant and cooperative but wants help with her anxiety.  She continues to voice suicidal ideation but is able to contract for safety on the unit.  Oriented her to the unit.  Admission paperwork completed and signed.  Belongings searched and secured in locker #47.  Skin assessment completed and no skin issues noted.  Q 15 minute checks initiated for safety.  We will monitor the progress towards her goals.

## 2016-10-02 LAB — GLUCOSE, CAPILLARY
GLUCOSE-CAPILLARY: 137 mg/dL — AB (ref 65–99)
Glucose-Capillary: 288 mg/dL — ABNORMAL HIGH (ref 65–99)
Glucose-Capillary: 247 mg/dL — ABNORMAL HIGH (ref 65–99)
Glucose-Capillary: 174 mg/dL — ABNORMAL HIGH (ref 65–99)

## 2016-10-02 MED FILL — Insulin Aspart Inj 100 Unit/ML: SUBCUTANEOUS | Qty: 0.04 | Status: AC

## 2016-10-02 NOTE — Progress Notes (Signed)
Recreation Therapy Notes  Date: 10/02/16 Time: 1000 Location: 500 Hall Dayroom  Group Topic: Communication, Team Building, Problem Solving  Goal Area(s) Addresses:  Patient will effectively work with peer towards shared goal.  Patient will identify skill used to make activity successful.  Patient will identify how skills used during activity can be used to reach post d/c goals.   Intervention: STEM Activity   Activity: [employer]. Patients were provided the following materials: 5 drinking straws, 5 rubber bands, 5 paper clips, 2 index cards, 2 drinking cups, and 2 toilet paper rolls. Using the provided materials patients were asked to build a launching mechanisms to launch a ping pong ball approximately 12 feet. Patients were divided into teams of 3-5.   Education: Education officer, community, Dentist.   Education Outcome: Acknowledges education/In group clarification offered/Needs additional education.   Clinical Observations/Feedback: Pt did not attend group.   [name], LRT/CTRS         Ria Comment, Traves Majchrzak A 10/02/2016 11:57 AM

## 2016-10-02 NOTE — BHH Group Notes (Signed)
[place] LCSW Group Therapy  10/02/2016  1:05 PM  Type of Therapy:  Group therapy  Participation Level:  Active  Participation Quality:  Attentive  Affect:  Flat  Cognitive:  Oriented  Insight:  Limited  Engagement in Therapy:  Limited  Modes of Intervention:  Discussion, Socialization  Summary of Progress/Problems:  Chaplain was here to lead a group on themes of hope and courage. Invited.  In bed asleep.  Roque Lias B 10/02/2016 1:44 PM

## 2016-10-02 NOTE — Progress Notes (Signed)
Pt did not attend warp up group.
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
## 2016-10-03 DIAGNOSIS — F419 Anxiety disorder, unspecified: Secondary | ICD-10-CM

## 2016-10-03 LAB — GLUCOSE, CAPILLARY
GLUCOSE-CAPILLARY: 226 mg/dL — AB (ref 65–99)
GLUCOSE-CAPILLARY: 207 mg/dL — AB (ref 65–99)
Glucose-Capillary: 189 mg/dL — ABNORMAL HIGH (ref 65–99)
Glucose-Capillary: 168 mg/dL — ABNORMAL HIGH (ref 65–99)

## 2016-10-03 NOTE — Plan of Care (Signed)
Problem: Safety: Goal: Periods of time without injury will increase Outcome: Progressing Patient remains safe on the unit; patient contracts for safety and is on q15 minute safety checks. Low fall risk precautions in place.

## 2016-10-03 NOTE — BHH Group Notes (Signed)
[place] LCSW Group Therapy  10/03/2016   Type of Therapy:  Group Therapy  Participation Level:  Did not attend  Christene Lye MSW, LCSW

## 2016-10-03 NOTE — Progress Notes (Signed)
[facility] MD Progress Note  10/03/2016 3:52 PM ADRIENNA KARIS  MRN:  [id number]  Subjective: Akeia reports, "I be doing good. I didn't go to group because I be asleep"   Objective: I have reviewed case with treatment team and have met with patient . Patient has presented withdrawn, tending to isolate, limited milieu interactions. She states she is feeling " less nervous" today. She also seems less intensely focused on issues regarding her hair/scalp.  Patient did have an episode of agitation / verbal exchange with her roommate, requiring them to be separated , moved to another room. This occurred earlier- at this time patient presents subdued, blunted in affect, and makes no mention of event.  Denies medication side effects. Denies any active SI.  Principal Problem:  MDD   Diagnosis:   Patient Active Problem List   Diagnosis Date Noted  . MDD (major depressive disorder) [F32.9] 10/01/2016  . Major depressive disorder, recurrent episode, mild ([place]) [F33.0] 05/04/2016  . 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 Learning disability [F81.9] 07/18/2015  . 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 Diabetes mellitus ([place]) [E11.9] 07/18/2015  . MDD (major depressive disorder), recurrent, severe, with psychosis ([place]) [F33.3] 07/18/2015  . Hyperlipidemia [E78.5] 07/18/2015  . Severe episode of recurrent major depressive disorder, without psychotic features ([place]) [F33.2]   . Drug overdose [T50.901A]   . Cognitive deficits [R41.89] 10/12/2012  . Generalized anxiety disorder [F41.1] 06/28/2012   Total Time spent with patient: 15 minutes  Past Medical History:  Past Medical History:  Diagnosis Date  . Anxiety   . Bipolar 1 disorder ([place])   . Cognitive deficits   . Depression   . Diabetes mellitus without complication ([place])   . Hypertension   . Mental disorder   . Obesity     Past Surgical History:  Procedure Laterality Date  . CESAREAN SECTION    . CESAREAN SECTION  N/A 04/25/2013   Procedure: REPEAT CESAREAN SECTION;  Surgeon: [name], MD;  Location: [place] [facility];  Service: Obstetrics;  Laterality: N/A;  . MASS EXCISION N/A 06/03/2012   Procedure: EXCISION MASS;  Surgeon: [name], MD;  Location: [place];  Service: ENT;  Laterality: N/A;  Excision uvula mass  . TONSILLECTOMY N/A 06/03/2012   Procedure: TONSILLECTOMY;  Surgeon: [name], MD;  Location: [place];  Service: ENT;  Laterality: N/A;  . TONSILLECTOMY     Family History:  Family History  Problem Relation Age of Onset  . Hypertension Mother   . Diabetes Father    Social History:  History  Alcohol Use No     History  Drug Use No    Comment: Patient denies    Social History   Social History  . Marital status: Single    Spouse name: N/A  . Number of children: N/A  . Years of education: N/A   Social History Main Topics  . Smoking status: Current Every Day Smoker    Packs/day: 1.00    Years: 7.00    Types: Cigarettes    Last attempt to quit: 06/30/2015  . Smokeless tobacco: Never Used  . Alcohol use No  . Drug use: No     Comment: Patient denies  . Sexual activity: Not Currently   Other Topics Concern  . None   Social History Narrative  . None   Additional Social History:  Pain Medications: See MAR Prescriptions: See MAR Over the Counter: See MAR History of alcohol / drug use?:  No history of alcohol / drug abuse Longest period of sobriety (when/how long): NA  Sleep: Good  Appetite:  Fair  Current Medications: Current Facility-Administered Medications  Medication Dose Route Frequency Provider Last Rate Last Dose  . acetaminophen (TYLENOL) tablet 650 mg  650 mg Oral Q6H PRN Cobos, Myer Peer, MD      . alum & mag hydroxide-simeth (MAALOX/MYLANTA) 200-200-20 MG/5ML suspension 30 mL  30 mL Oral Q4H PRN Cobos, Myer Peer, MD      . busPIRone (BUSPAR) tablet 15 mg  15 mg Oral BID Cobos, Myer Peer, MD   15 mg at 10/03/16  0813  . darifenacin (ENABLEX) 24 hr tablet 7.5 mg  7.5 mg Oral Daily Veleta Yamamoto I, NP   7.5 mg at 10/03/16 0812  . DULoxetine (CYMBALTA) DR capsule 60 mg  60 mg Oral Daily Cobos, Myer Peer, MD   60 mg at 10/03/16 0813  . gabapentin (NEURONTIN) capsule 400 mg  400 mg Oral BID Lindell Spar I, NP   400 mg at 10/03/16 0813  . glipiZIDE (GLUCOTROL XL) 24 hr tablet 10 mg  10 mg Oral QPM Joyceline Maiorino I, NP   10 mg at 10/02/16 1723  . hydrochlorothiazide (HYDRODIURIL) tablet 25 mg  25 mg Oral QPM Cicley Ganesh I, NP   25 mg at 10/02/16 1722  . hydrOXYzine (ATARAX/VISTARIL) tablet 25 mg  25 mg Oral Q6H PRN Dara Hoyer, PA-C   25 mg at 10/02/16 1722  . insulin aspart (novoLOG) injection 0-15 Units  0-15 Units Subcutaneous TID WC Lindell Spar I, NP   3 Units at 10/03/16 1157  . insulin aspart (novoLOG) injection 4 Units  4 Units Subcutaneous TID WC Lindell Spar I, NP   4 Units at 10/03/16 1156  . magnesium hydroxide (MILK OF MAGNESIA) suspension 30 mL  30 mL Oral Daily PRN Cobos, Fernando A, MD      . metFORMIN (GLUCOPHAGE) tablet 500 mg  500 mg Oral BID WC Da Michelle I, NP   500 mg at 10/03/16 0813  . nicotine polacrilex (NICORETTE) gum 2 mg  2 mg Oral PRN Cobos, Myer Peer, MD   2 mg at 10/01/16 1906  . ondansetron (ZOFRAN) tablet 4 mg  4 mg Oral Q8H PRN Lindell Spar I, NP   4 mg at 10/03/16 1159  . pantoprazole (PROTONIX) EC tablet 40 mg  40 mg Oral Daily Lindell Spar I, NP   40 mg at 10/03/16 9326  . traZODone (DESYREL) tablet 50 mg  50 mg Oral QHS PRN Dara Hoyer, PA-C       Lab Results:  Results for orders placed or performed during the hospital encounter of 10/01/16 (from the past 48 hour(s))  Glucose, capillary     Status: Abnormal   Collection Time: 10/01/16  5:16 PM  Result Value Ref Range   Glucose-Capillary 174 (H) 65 - 99 mg/dL  Glucose, capillary     Status: Abnormal   Collection Time: 10/01/16  8:17 PM  Result Value Ref Range   Glucose-Capillary 161 (H) 65 - 99 mg/dL   Glucose, capillary     Status: Abnormal   Collection Time: 10/02/16  5:44 AM  Result Value Ref Range   Glucose-Capillary 288 (H) 65 - 99 mg/dL  Glucose, capillary     Status: Abnormal   Collection Time: 10/02/16 12:11 PM  Result Value Ref Range   Glucose-Capillary 137 (H) 65 - 99 mg/dL  Glucose, capillary     Status: Abnormal  Collection Time: 10/02/16  4:59 PM  Result Value Ref Range   Glucose-Capillary 247 (H) 65 - 99 mg/dL  Glucose, capillary     Status: Abnormal   Collection Time: 10/02/16  8:54 PM  Result Value Ref Range   Glucose-Capillary 174 (H) 65 - 99 mg/dL  Glucose, capillary     Status: Abnormal   Collection Time: 10/03/16  6:10 AM  Result Value Ref Range   Glucose-Capillary 189 (H) 65 - 99 mg/dL  Glucose, capillary     Status: Abnormal   Collection Time: 10/03/16 11:51 AM  Result Value Ref Range   Glucose-Capillary 168 (H) 65 - 99 mg/dL   Blood Alcohol level:  Lab Results  Component Value Date   ETH <5 09/30/2016   ETH <5 10/18/7626   Metabolic Disorder Labs: Lab Results  Component Value Date   HGBA1C 7.8 (H) 07/18/2015   MPG 177 07/18/2015   MPG 166 05/11/2015   Lab Results  Component Value Date   PROLACTIN 37.3 (H) 07/18/2015   PROLACTIN 42.0 (H) 05/11/2015   Lab Results  Component Value Date   CHOL 212 (H) 07/18/2015   TRIG 496 (H) 07/18/2015   HDL 40 (L) 07/18/2015   CHOLHDL 5.3 07/18/2015   VLDL UNABLE TO CALCULATE IF TRIGLYCERIDE OVER 400 mg/dL 07/18/2015   LDLCALC UNABLE TO CALCULATE IF TRIGLYCERIDE OVER 400 mg/dL 07/18/2015   LDLCALC 116 (H) 05/11/2015   Physical Findings: AIMS: Facial and Oral Movements Muscles of Facial Expression: None, normal Lips and Perioral Area: None, normal Jaw: None, normal Tongue: None, normal,Extremity Movements Upper (arms, wrists, hands, fingers): None, normal Lower (legs, knees, ankles, toes): None, normal, Trunk Movements Neck, shoulders, hips: None, normal, Overall Severity Severity of abnormal  movements (highest score from questions above): None, normal Incapacitation due to abnormal movements: None, normal Patient's awareness of abnormal movements (rate only patient's report): No Awareness, Dental Status Current problems with teeth and/or dentures?: No Does patient usually wear dentures?: No  CIWA:    COWS:     Musculoskeletal: Strength & Muscle Tone: within normal limits Gait & Station: normal Patient leans: N/A  Psychiatric Specialty Exam: Physical Exam  ROS denies chest pain, no shortness of breath  Blood pressure (!) 130/93, pulse (!) 126, temperature 97.7 F (36.5 C), temperature source Oral, resp. rate 20, height _0  (1.702 m), weight 122.9 kg (271 lb), SpO2 100 %.Body mass index is 42.44 kg/m.  General Appearance: Fairly Groomed  Eye Contact:  Good  Speech:  decreased  Volume:  Decreased  Mood:  statesshe feels " less nervous"  Affect:  presents blunted, subdued  Thought Process:  Linear/ concrete associations   Orientation:  Other:  alert and attentive  Thought Content:  denies hallucinations at this time, less intensely focused on issues regarding feeling compelled to shave her hair  Suicidal Thoughts:  No today denies suicidal plan or intention and contracts for safety on unit   Homicidal Thoughts:  No denies   Memory:  recent and remote fair   Judgement:  Fair  Insight:  limited   Psychomotor Activity:  Decreased  Concentration:  Concentration: Fair and Attention Span: Fair  Recall:  [employer] of Knowledge:  Fair  Language:  Fair  Akathisia:  Negative  Handed:  Right  AIMS (if indicated):     Assets:  Desire for Improvement Resilience  ADL's:  Fair   Cognition:  WNL  Sleep:  Number of Hours: 6.75   Assessment - Patient presents blunted in  affect, subdued, with little interactions with peers or staff. She denies SI. She does report feeling less anxious and is less focused on thoughts about her hair /scalp which had been intensely preoccupying   on admission. She has had episode of agitation with another patient which required staff involvement . Thus far tolerating medication well .  Will continue today 10/03/16 plan as below except where it is noted.  Treatment Plan Summary: Daily contact with patient to assess and evaluate symptoms and progress in treatment, Medication management, Plan inpatient admission and medications as below Encourage group and milieu participation to work on coping skills and symptom reduction Continue Cymbalta 60 mgrs QDAY for depression Continue Neurontin 400 mgrs BID for anxiety and pain Continue Buspar 15 mgrs BID for anxiety She is on [place]- states she received her monthly dose just prior to admission Continue diabetic management  Treatment team working on disposition planning options.  Encarnacion Slates, NP, PMHNP, FNP-BC. 10/03/2016, 3:52 PMPatient ID: [name], female   DOB: 1991-02-10, 26 y.o.   MRN: [id number]

## 2016-10-03 NOTE — Progress Notes (Signed)
D: Pt presents with flat affect and depressed mood. Pt appears withdrawn and isolative to room. Pt reports decreased depression, anxiety and SI today. No active suicidal thoughts verbalized by pt today. Pt verbally contracts for safety. Pt denies AVH. Pt reports feeling a lot better today. A: Medications reviewed with pt. Medications administered as ordered per MD. Verbal support provided. Pt encouraged to attend groups. 15 minute checks performed for safety.  R: Pt compliant with tx.

## 2016-10-03 NOTE — Progress Notes (Signed)
Nursing Progress Note [id number]  D) Patient presents with labile mood and flat affect. Patient was isolative to her room and minimal with writer this evening. Patient denied need for sleep medication. CBG obtained and 179. Patient denies SI/HI/AVH or pain. Patient contracts for safety on the unit. Patient reports sleeping well with current regimen.   A)  Emotional support given. 1:1 interaction and active listening provided. Medications provided as ordered and reviewed with patient. Snacks and fluids provided. Opportunities for questions or concerns presented to patient. Patient encouraged to continue to work on treatment goals. Labs, vital signs and patient behavior monitored throughout shift. Patient safety maintained with q15 min safety checks. Low fall risk precautions in place and reviewed with patient; patient verbalized understanding.  R) Patient receptive to interaction with nurse. Patient remains safe on the unit at this time. Patient denies any adverse medication reactions at this time. Patient is resting in bed without complaints. Will continue to monitor.

## 2016-10-03 NOTE — Progress Notes (Signed)
[facility] Notes:  (Nursing/MHT/Case Management/Adjunct)  Date:  10/03/2016  Time:  9:29 PM  Type of Therapy:  Psychoeducational Skills  Participation Level:  Active  Participation Quality:  Attentive  Affect:  Flat  Cognitive:  Appropriate  Insight:  Appropriate  Engagement in Group:  Developing/Improving  Modes of Intervention:  Education  Summary of Progress/Problems: The patient described her day as having been "okay", but that she has been experiencing "negative thoughts". She agrees to contact the staff in the event that the thoughts become too overwhelming for her. Her goal for tomorrow is to address her anxiety and depression.   [name] S 10/03/2016, 9:29 PM

## 2016-10-04 LAB — GLUCOSE, CAPILLARY
GLUCOSE-CAPILLARY: 233 mg/dL — AB (ref 65–99)
Glucose-Capillary: 183 mg/dL — ABNORMAL HIGH (ref 65–99)
Glucose-Capillary: 220 mg/dL — ABNORMAL HIGH (ref 65–99)
Glucose-Capillary: 262 mg/dL — ABNORMAL HIGH (ref 65–99)

## 2016-10-04 NOTE — Progress Notes (Signed)
[facility] MD Progress Note  10/04/2016 5:09 PM [name]  MRN:  [id number]  Subjective: [name] reports, "I'm doing good, feeling more relaxed today"  Objective: I have reviewed case with treatment team and have met with patient . Patient has presented more improved today. Affect is reactive. Is attending group milieu today. Improved interactions with the staff. She states she is feeling better & more relaxed today. She also seems less intensely focused on issues regarding her hair/scalp.  Denies medication side effects. Is cooperative with staff. No disruptive behavior on the unit. Denies any active SI.  Principal Problem:  MDD   Diagnosis:   Patient Active Problem List   Diagnosis Date Noted  . MDD (major depressive disorder) [F32.9] 10/01/2016  . Major depressive disorder, recurrent episode, mild ([place]) [F33.0] 05/04/2016  . 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 Learning disability [F81.9] 07/18/2015  . 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 Diabetes mellitus ([place]) [E11.9] 07/18/2015  . MDD (major depressive disorder), recurrent, severe, with psychosis ([place]) [F33.3] 07/18/2015  . Hyperlipidemia [E78.5] 07/18/2015  . Severe episode of recurrent major depressive disorder, without psychotic features ([place]) [F33.2]   . Drug overdose [T50.901A]   . Cognitive deficits [R41.89] 10/12/2012  . Generalized anxiety disorder [F41.1] 06/28/2012   Total Time spent with patient: 15 minutes  Past Medical History:  Past Medical History:  Diagnosis Date  . Anxiety   . Bipolar 1 disorder ([name])   . Cognitive deficits   . Depression   . Diabetes mellitus without complication ([name])   . Hypertension   . Mental disorder   . Obesity     Past Surgical History:  Procedure Laterality Date  . CESAREAN SECTION    . CESAREAN SECTION N/A 04/25/2013   Procedure: REPEAT CESAREAN SECTION;  Surgeon: [name], MD;  Location: [place] [facility];  Service: Obstetrics;  Laterality: N/A;   . MASS EXCISION N/A 06/03/2012   Procedure: EXCISION MASS;  Surgeon: [name], MD;  Location: [place];  Service: ENT;  Laterality: N/A;  Excision uvula mass  . TONSILLECTOMY N/A 06/03/2012   Procedure: TONSILLECTOMY;  Surgeon: [name], MD;  Location: [place];  Service: ENT;  Laterality: N/A;  . TONSILLECTOMY     Family History:  Family History  Problem Relation Age of Onset  . Hypertension Mother   . Diabetes Father    Social History:  History  Alcohol Use No     History  Drug Use No    Comment: Patient denies    Social History   Social History  . Marital status: Single    Spouse name: N/A  . Number of children: N/A  . Years of education: N/A   Social History Main Topics  . Smoking status: Current Every Day Smoker    Packs/day: 1.00    Years: 7.00    Types: Cigarettes    Last attempt to quit: 06/30/2015  . Smokeless tobacco: Never Used  . Alcohol use No  . Drug use: No     Comment: Patient denies  . Sexual activity: Not Currently   Other Topics Concern  . None   Social History Narrative  . None   Additional Social History:  Pain Medications: See MAR Prescriptions: See MAR Over the Counter: See MAR History of alcohol / drug use?: No history of alcohol / drug abuse Longest period of sobriety (when/how long): NA  Sleep: Good  Appetite:  Fair  Current Medications: Current Facility-Administered Medications  Medication  Dose Route Frequency Provider Last Rate Last Dose  . acetaminophen (TYLENOL) tablet 650 mg  650 mg Oral Q6H PRN Cobos, Myer Peer, MD      . alum & mag hydroxide-simeth (MAALOX/MYLANTA) 200-200-20 MG/5ML suspension 30 mL  30 mL Oral Q4H PRN Cobos, Myer Peer, MD      . busPIRone (BUSPAR) tablet 15 mg  15 mg Oral BID Cobos, Myer Peer, MD   15 mg at 10/04/16 0800  . darifenacin (ENABLEX) 24 hr tablet 7.5 mg  7.5 mg Oral Daily Wong Steadham I, NP   7.5 mg at 10/04/16 0800  . DULoxetine (CYMBALTA) DR  capsule 60 mg  60 mg Oral Daily Cobos, Myer Peer, MD   60 mg at 10/04/16 0800  . gabapentin (NEURONTIN) capsule 400 mg  400 mg Oral BID Lindell Spar I, NP   400 mg at 10/04/16 0800  . glipiZIDE (GLUCOTROL XL) 24 hr tablet 10 mg  10 mg Oral QPM Catalia Massett I, NP   10 mg at 10/03/16 1705  . hydrochlorothiazide (HYDRODIURIL) tablet 25 mg  25 mg Oral QPM Madisun Hargrove I, NP   25 mg at 10/03/16 1705  . hydrOXYzine (ATARAX/VISTARIL) tablet 25 mg  25 mg Oral Q6H PRN Dara Hoyer, PA-C   25 mg at 10/03/16 2131  . insulin aspart (novoLOG) injection 0-15 Units  0-15 Units Subcutaneous TID WC Lindell Spar I, NP   5 Units at 10/04/16 1201  . insulin aspart (novoLOG) injection 4 Units  4 Units Subcutaneous TID WC Lindell Spar I, NP   4 Units at 10/04/16 1201  . magnesium hydroxide (MILK OF MAGNESIA) suspension 30 mL  30 mL Oral Daily PRN Cobos, Fernando A, MD      . metFORMIN (GLUCOPHAGE) tablet 500 mg  500 mg Oral BID WC Roylee Chaffin I, NP   500 mg at 10/04/16 0800  . nicotine polacrilex (NICORETTE) gum 2 mg  2 mg Oral PRN Cobos, Myer Peer, MD   2 mg at 10/01/16 1906  . ondansetron (ZOFRAN) tablet 4 mg  4 mg Oral Q8H PRN Lindell Spar I, NP   4 mg at 10/04/16 0800  . pantoprazole (PROTONIX) EC tablet 40 mg  40 mg Oral Daily Jalan Fariss I, NP   40 mg at 10/04/16 0800  . traZODone (DESYREL) tablet 50 mg  50 mg Oral QHS PRN Dara Hoyer, PA-C   50 mg at 10/03/16 2131   Lab Results:  Results for orders placed or performed during the hospital encounter of 10/01/16 (from the past 48 hour(s))  Glucose, capillary     Status: Abnormal   Collection Time: 10/02/16  8:54 PM  Result Value Ref Range   Glucose-Capillary 174 (H) 65 - 99 mg/dL  Glucose, capillary     Status: Abnormal   Collection Time: 10/03/16  6:10 AM  Result Value Ref Range   Glucose-Capillary 189 (H) 65 - 99 mg/dL  Glucose, capillary     Status: Abnormal   Collection Time: 10/03/16 11:51 AM  Result Value Ref Range   Glucose-Capillary 168  (H) 65 - 99 mg/dL  Glucose, capillary     Status: Abnormal   Collection Time: 10/03/16  5:04 PM  Result Value Ref Range   Glucose-Capillary 226 (H) 65 - 99 mg/dL  Glucose, capillary     Status: Abnormal   Collection Time: 10/03/16  9:03 PM  Result Value Ref Range   Glucose-Capillary 207 (H) 65 - 99 mg/dL  Glucose, capillary  Status: Abnormal   Collection Time: 10/04/16  6:08 AM  Result Value Ref Range   Glucose-Capillary 183 (H) 65 - 99 mg/dL  Glucose, capillary     Status: Abnormal   Collection Time: 10/04/16 11:48 AM  Result Value Ref Range   Glucose-Capillary 220 (H) 65 - 99 mg/dL   Blood Alcohol level:  Lab Results  Component Value Date   ETH <5 09/30/2016   ETH <5 25/42/7062   Metabolic Disorder Labs: Lab Results  Component Value Date   HGBA1C 7.8 (H) 07/18/2015   MPG 177 07/18/2015   MPG 166 05/11/2015   Lab Results  Component Value Date   PROLACTIN 37.3 (H) 07/18/2015   PROLACTIN 42.0 (H) 05/11/2015   Lab Results  Component Value Date   CHOL 212 (H) 07/18/2015   TRIG 496 (H) 07/18/2015   HDL 40 (L) 07/18/2015   CHOLHDL 5.3 07/18/2015   VLDL UNABLE TO CALCULATE IF TRIGLYCERIDE OVER 400 mg/dL 07/18/2015   LDLCALC UNABLE TO CALCULATE IF TRIGLYCERIDE OVER 400 mg/dL 07/18/2015   LDLCALC 116 (H) 05/11/2015   Physical Findings: AIMS: Facial and Oral Movements Muscles of Facial Expression: None, normal Lips and Perioral Area: None, normal Jaw: None, normal Tongue: None, normal,Extremity Movements Upper (arms, wrists, hands, fingers): None, normal Lower (legs, knees, ankles, toes): None, normal, Trunk Movements Neck, shoulders, hips: None, normal, Overall Severity Severity of abnormal movements (highest score from questions above): None, normal Incapacitation due to abnormal movements: None, normal Patient's awareness of abnormal movements (rate only patient's report): No Awareness, Dental Status Current problems with teeth and/or dentures?: No Does patient  usually wear dentures?: No  CIWA:    COWS:     Musculoskeletal: Strength & Muscle Tone: within normal limits Gait & Station: normal Patient leans: N/A  Psychiatric Specialty Exam: Physical Exam  Review of Systems  Psychiatric/Behavioral: Positive for depression ("Improving"). Negative for hallucinations, memory loss, substance abuse and suicidal ideas. The patient has insomnia ("Improving"). The patient is not nervous/anxious.    denies chest pain, no shortness of breath  Blood pressure 109/76, pulse (!) 113, temperature 98.7 F (37.1 C), temperature source Oral, resp. rate 16, height '5\' 7"'  (1.702 m), weight 122.9 kg (271 lb), SpO2 100 %.Body mass index is 42.44 kg/m.  General Appearance: Fairly Groomed  Eye Contact:  Good  Speech:  decreased  Volume:  Decreased  Mood:  statesshe feels " less nervous"  Affect:  presents blunted, subdued  Thought Process:  Linear/ concrete associations   Orientation:  Other:  alert and attentive  Thought Content:  denies hallucinations at this time, less intensely focused on issues regarding feeling compelled to shave her hair  Suicidal Thoughts:  No today denies suicidal plan or intention and contracts for safety on unit   Homicidal Thoughts:  No denies   Memory:  recent and remote fair   Judgement:  Fair  Insight:  limited   Psychomotor Activity:  Decreased  Concentration:  Concentration: Fair and Attention Span: Fair  Recall:  [employer] of Knowledge:  Fair  Language:  Fair  Akathisia:  Negative  Handed:  Right  AIMS (if indicated):     Assets:  Desire for Improvement Resilience  ADL's:  Fair   Cognition:  WNL  Sleep:  Number of Hours: 6.75   Assessment - Patient presents improved affect, subdued, with improved interactions with peers or staff. She denies SI. She does report feeling less anxious and is less focused on thoughts about her hair /scalp which  had been intensely preoccupying  on admission. She has had episode of agitation  with another patient which required staff involvement, but, no disruptive behavior on the unit today. Thus far tolerating medication well .  Will continue today 10/04/16 plan as below except where it is noted.  Treatment Plan Summary: Daily contact with patient to assess and evaluate symptoms and progress in treatment, Medication management, Plan inpatient admission and medications as below Encourage group and milieu participation to work on coping skills and symptom reduction Continue Cymbalta 60 mgrs QDAY for depression Continue Neurontin 400 mgrs BID for anxiety and pain Continue Buspar 15 mgrs BID for anxiety She is on [place]- states she received her monthly dose just prior to admission Continue diabetic management  Treatment team working on disposition planning options.  Encarnacion Slates, NP, PMHNP, FNP-BC. 10/04/2016, 5:09 PMPatient ID: [name], female   DOB: [birth year], 26 y.o.   MRN: [id number]

## 2016-10-04 NOTE — Progress Notes (Signed)
D: Pt appears brighter on approach today. Pt continues to isolate in her room but with encouragement pt will attend groups. Pt reports decreased depression, anxiety and SI today. No active suicidal thoughts verbalized by pt today. Pt verbally contracts for safety. Pt denies AVH. Pt reports feeling a lot better today. A: Medications reviewed with pt. Medications administered as ordered per MD. Verbal support provided. Pt encouraged to attend groups. 15 minute checks performed for safety.  R: Pt compliant with tx.

## 2016-10-04 NOTE — Progress Notes (Signed)
[place] Group Notes:  (Nursing/MHT/Case Management/Adjunct)  Date:  10/04/2016  Time:  11:57 PM  Type of Therapy:  Psychoeducational Skills  Participation Level:  Active  Participation Quality:  Appropriate  Affect:  Appropriate  Cognitive:  Appropriate  Insight:  Appropriate  Engagement in Group:  Engaged  Modes of Intervention:  Education  Summary of Progress/Problems: Patient verbalized that she talked to her mother and sister today and that she was successful in terms of remaining awake all day. Her goal for tomorrow is to get discharged.   Archie Balboa S 10/04/2016, 11:57 PM

## 2016-10-04 NOTE — Progress Notes (Signed)
Nursing Progress Note: 7p-7a D: Pt currently presents with a depressed/brightens on approach affect and behavior. Pt states "I feel great better than I did when I came in that's for sure. I am ready to go home." Interacting appropriately with milieu. Pt reports fair sleep with current medication regimen.   A: Pt provided with medications per providers orders. Pt's labs and vitals were monitored throughout the night. Pt supported emotionally and encouraged to express concerns and questions. Pt educated on medications.  R: Pt's safety ensured with 15 minute and environmental checks. Pt currently denies SI/HI/Self Harm and AVH. Pt verbally contracts to seek staff if SI/HI or A/VH occurs and to consult with staff before acting on any harmful thoughts. Will continue to monitor.

## 2016-10-04 NOTE — Progress Notes (Signed)
D: Patient complained of anxiety. Visible on dayroom. No interaction. Pleasant upon approach. Denies pain, SI/HI, AH/VH at this time. Cooperative on unit. No behavior issues noted.  A: Staff offered support and encouragement as needed. Routine safety checks maintained. Will continue to monitor patient.  R: Patient remains safe

## 2016-10-04 NOTE — [facility] Group Notes (Signed)
[facility] LCSW Group Therapy  10/04/2016 10 AM  Type of Therapy:  Group Therapy  Participation Level:  Did Not Attend; invited to participate yet did not despite overhead announcement and encouragement by staff    Sheilah Pigeon, LCSW

## 2016-10-05 DIAGNOSIS — F429 Obsessive-compulsive disorder, unspecified: Secondary | ICD-10-CM

## 2016-10-05 DIAGNOSIS — G4700 Insomnia, unspecified: Secondary | ICD-10-CM

## 2016-10-05 LAB — GLUCOSE, CAPILLARY
GLUCOSE-CAPILLARY: 251 mg/dL — AB (ref 65–99)
GLUCOSE-CAPILLARY: 259 mg/dL — AB (ref 65–99)
Glucose-Capillary: 255 mg/dL — ABNORMAL HIGH (ref 65–99)
Glucose-Capillary: 265 mg/dL — ABNORMAL HIGH (ref 65–99)

## 2016-10-05 LAB — TSH: TSH: 1.936 u[IU]/mL (ref 0.350–4.500)

## 2016-10-05 NOTE — Progress Notes (Signed)
Recreation Therapy Notes  Date: 10/05/16 Time: 1000 Location: 500 Hall Dayroom  Group Topic: Coping Skills  Goal Area(s) Addresses:  Patients will be able to identify coping skills. Patients will be able to identify the importance of coping skills. Patients will be able to identify the importance of coping skills post d/c.  Behavioral Response: Minimal  Intervention: Mind map, pencils  Activity: Mind map.  Patients were given [name] mind map to fill out.  LRT and patients filled in the first 8 boxes together (discipline, school, drugs, health, family, job, relationships and money).  Patients were to then come up with coping skills for each situation on their own.  The group then came back together and LRT filled in the coping skills on the board.  Patients would then fill in any holes they had on their sheets.  Education: Radiographer, therapeutic, Dentist.   Education Outcome: Acknowledges understanding/In group clarification offered/Needs additional education.   Clinical Observations/Feedback: Pt sat by the door quietly.  Pt did fill in her sheet but did not interact during group.    [name], LRT/CTRS       Ria Comment, [name] 10/05/2016 1:11 PM

## 2016-10-05 NOTE — [facility] Suicide Risk Assessment (Signed)
[place] INPATIENT:  Family/Significant Other Suicide Prevention Education  Suicide Prevention Education:  Education Completed; [name], [place] [address]  has been identified by the patient as the family member/significant other with whom the patient will be residing, and identified as the person(s) who will aid the patient in the event of a mental health crisis (suicidal ideations/suicide attempt).  With written consent from the patient, the family member/significant other has been provided the following suicide prevention education, prior to the and/or following the discharge of the patient.  The suicide prevention education provided includes the following:  Suicide risk factors  Suicide prevention and interventions  National Suicide Hotline telephone number  [facility] assessment telephone number  [facility] Emergency Assistance [place] and/or Residential Mobile Crisis Unit telephone number  Request made of family/significant other to:  Remove weapons (e.g., guns, rifles, knives), all items previously/currently identified as safety concern.  No guns in the home  Remove drugs/medications (over-the-counter, prescriptions, illicit drugs), all items previously/currently identified as a safety concern.  The family member/significant other verbalizes understanding of the suicide prevention education information provided.  The family member/significant other agrees to remove the items of safety concern listed above.   Okay 10/05/2016, 11:48 AM

## 2016-10-05 NOTE — Progress Notes (Signed)
Nursing Progress Note: 7p-7a D: Pt currently presents with a pleasant/anxious affect and behavior. 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 I need more Gabapentin." Interacting appropriately with milieu. Pt reports fair sleep with current medication regimen.   A: Pt provided with medications per providers orders. Pt's labs and vitals were monitored throughout the night. Pt supported emotionally and encouraged to express concerns and questions. Pt educated on medications.  R: Pt's safety ensured with 15 minute and environmental checks. Pt currently denies SI/HI/Self Harm and AVH. Pt verbally contracts to seek staff if SI/HI or A/VH occurs and to consult with staff before acting on any harmful thoughts. Will continue to monitor.

## 2016-10-05 NOTE — Tx Team (Signed)
Interdisciplinary Treatment and Diagnostic Plan Update 10/05/2016 Time of Session: 9:30am  [name]  MRN: [id number]  Principal Diagnosis: MDD, recurrent, consider OCD   Secondary Diagnoses: Active Problems:   MDD (major depressive disorder)   Current Medications:  Current Facility-Administered Medications  Medication Dose Route Frequency Provider Last Rate Last Dose  . acetaminophen (TYLENOL) tablet 650 mg  650 mg Oral Q6H PRN Cobos, Myer Peer, MD      . alum & mag hydroxide-simeth (MAALOX/MYLANTA) 200-200-20 MG/5ML suspension 30 mL  30 mL Oral Q4H PRN Cobos, Myer Peer, MD      . busPIRone (BUSPAR) tablet 15 mg  15 mg Oral BID Cobos, Myer Peer, MD   15 mg at 10/05/16 0745  . darifenacin (ENABLEX) 24 hr tablet 7.5 mg  7.5 mg Oral Daily Nwoko, Agnes I, NP   7.5 mg at 10/05/16 0745  . DULoxetine (CYMBALTA) DR capsule 60 mg  60 mg Oral Daily Cobos, Myer Peer, MD   60 mg at 10/05/16 0745  . gabapentin (NEURONTIN) capsule 400 mg  400 mg Oral BID Lindell Spar I, NP   400 mg at 10/05/16 0745  . glipiZIDE (GLUCOTROL XL) 24 hr tablet 10 mg  10 mg Oral QPM Nwoko, Agnes I, NP   10 mg at 10/04/16 1724  . hydrochlorothiazide (HYDRODIURIL) tablet 25 mg  25 mg Oral QPM Nwoko, Agnes I, NP   25 mg at 10/04/16 1725  . hydrOXYzine (ATARAX/VISTARIL) tablet 25 mg  25 mg Oral Q6H PRN Dara Hoyer, PA-C   25 mg at 10/04/16 2127  . insulin aspart (novoLOG) injection 0-15 Units  0-15 Units Subcutaneous TID WC Lindell Spar I, NP   8 Units at 10/05/16 [phone]  . insulin aspart (novoLOG) injection 4 Units  4 Units Subcutaneous TID WC Lindell Spar I, NP   4 Units at 10/05/16 [phone]  . magnesium hydroxide (MILK OF MAGNESIA) suspension 30 mL  30 mL Oral Daily PRN Cobos, Fernando A, MD      . metFORMIN (GLUCOPHAGE) tablet 500 mg  500 mg Oral BID WC Nwoko, Agnes I, NP   500 mg at 10/05/16 0746  . nicotine polacrilex (NICORETTE) gum 2 mg  2 mg Oral PRN Cobos, Myer Peer, MD   2 mg at 10/01/16 1906  . ondansetron  (ZOFRAN) tablet 4 mg  4 mg Oral Q8H PRN Lindell Spar I, NP   4 mg at 10/05/16 0747  . pantoprazole (PROTONIX) EC tablet 40 mg  40 mg Oral Daily Lindell Spar I, NP   40 mg at 10/05/16 0746  . traZODone (DESYREL) tablet 50 mg  50 mg Oral QHS PRN Dara Hoyer, PA-C   50 mg at 10/04/16 2127    PTA Medications: Prescriptions Prior to Admission  Medication Sig Dispense Refill Last Dose  . busPIRone (BUSPAR) 15 MG tablet Take 15 mg by mouth every evening.    09/29/2016 at Unknown time  . DULoxetine (CYMBALTA) 60 MG capsule Take 60 mg by mouth every evening.   1 Past Week at Unknown time  . fluvoxaMINE (LUVOX) 100 MG tablet Take 100 mg by mouth every evening.    09/29/2016 at Unknown time  . gabapentin (NEURONTIN) 400 MG capsule Take 400 mg by mouth 2 (two) times daily.   Past Week at Unknown time  . glipiZIDE (GLUCOTROL XL) 10 MG 24 hr tablet Take 10 mg by mouth every evening.    09/29/2016 at Unknown time  . hydrochlorothiazide (HYDRODIURIL) 25 MG tablet Take  25 mg by mouth every evening.    Past Month at Unknown time  . metFORMIN (GLUCOPHAGE) 500 MG tablet Take 500 mg by mouth 2 (two) times daily with a meal.   09/29/2016 at Unknown time  . omeprazole (PRILOSEC) 20 MG capsule Take 20 mg by mouth daily.   Past Week at Unknown time  . ondansetron (ZOFRAN ODT) 8 MG disintegrating tablet Take 1 tablet (8 mg total) by mouth every 8 (eight) hours as needed for nausea or vomiting. (Patient not taking: Reported on 09/10/2016) 10 tablet 0 Not Taking at Unknown time  . ondansetron (ZOFRAN) 4 MG tablet Take 1 tablet (4 mg total) by mouth every 8 (eight) hours as needed for nausea or vomiting. 12 tablet 0 09/29/2016 at Unknown time  . paliperidone (INVEGA SUSTENNA) 234 MG/1.5ML SUSP injection Inject 234 mg into the muscle every 30 (thirty) days.   09/30/2016 at Unknown time  . promethazine (PHENERGAN) 25 MG tablet Take 1 tablet (25 mg total) by mouth every 6 (six) hours as needed for nausea or vomiting. 10 tablet 0  09/29/2016 at Unknown time  . solifenacin (VESICARE) 5 MG tablet Take 5 mg by mouth daily.   Past Week at Unknown time    Treatment Modalities: Medication Management, Group therapy, Case management,  1 to 1 session with clinician, Psychoeducation, Recreational therapy.  Patient Stressors: Health problems Marital or family conflict Patient Strengths: Network engineer for treatment/growth  Physician Treatment Plan for Primary Diagnosis: MDD, recurrent, consider OCD  Long Term Goal(s): Improvement in symptoms so as ready for discharge Short Term Goals: Ability to verbalize feelings will improve Ability to disclose and discuss suicidal ideas Ability to demonstrate self-control will improve Ability to identify and develop effective coping behaviors will improve Ability to maintain clinical measurements within normal limits will improve Ability to verbalize feelings will improve Ability to disclose and discuss suicidal ideas Ability to demonstrate self-control will improve Ability to identify and develop effective coping behaviors will improve Ability to maintain clinical measurements within normal limits will improve  Medication Management: Evaluate patient's response, side effects, and tolerance of medication regimen.  Therapeutic Interventions: 1 to 1 sessions, Unit Group sessions and Medication administration.  Evaluation of Outcomes: Adequate for Discharge  Physician Treatment Plan for Secondary Diagnosis: Active Problems:   MDD (major depressive disorder)  Long Term Goal(s): Improvement in symptoms so as ready for discharge  Short Term Goals: Ability to verbalize feelings will improve Ability to disclose and discuss suicidal ideas Ability to demonstrate self-control will improve Ability to identify and develop effective coping behaviors will improve Ability to maintain clinical measurements within normal limits will improve Ability to  verbalize feelings will improve Ability to disclose and discuss suicidal ideas Ability to demonstrate self-control will improve Ability to identify and develop effective coping behaviors will improve Ability to maintain clinical measurements within normal limits will improve  Medication Management: Evaluate patient's response, side effects, and tolerance of medication regimen.  Therapeutic Interventions: 1 to 1 sessions, Unit Group sessions and Medication administration.  Evaluation of Outcomes: Adequate for Discharge  RN Treatment Plan for Primary Diagnosis: MDD, recurrent, consider OCD  Long Term Goal(s): Knowledge of disease and therapeutic regimen to maintain health will improve  Short Term Goals: Ability to disclose and discuss suicidal ideas and Compliance with prescribed medications will improve  Medication Management: RN will administer medications as ordered by provider, will assess and evaluate patient's response and provide education to patient for prescribed medication.  RN will report any adverse and/or side effects to prescribing provider.  Therapeutic Interventions: 1 on 1 counseling sessions, Psychoeducation, Medication administration, Evaluate responses to treatment, Monitor vital signs and CBGs as ordered, Perform/monitor CIWA, COWS, AIMS and Fall Risk screenings as ordered, Perform wound care treatments as ordered.  Evaluation of Outcomes: Adequate for Discharge  LCSW Treatment Plan for Primary Diagnosis: MDD, recurrent, consider OCD  Long Term Goal(s): Safe transition to appropriate next level of care at discharge, Engage patient in therapeutic group addressing interpersonal concerns. Short Term Goals: Engage patient in aftercare planning with referrals and resources, Increase ability to appropriately verbalize feelings, Identify triggers associated with mental health/substance abuse issues and Increase skills for wellness and recovery  Therapeutic Interventions: Assess  for all discharge needs, 1 to 1 time with Social worker, Explore available resources and support systems, Assess for adequacy in community support network, Educate family and significant other(s) on suicide prevention, Complete Psychosocial Assessment, Interpersonal group therapy.  Evaluation of Outcomes: Adequate for Discharge  Progress in Treatment: Attending groups: Yes Participating in groups: Yes Taking medication as prescribed: Yes, MD continues to assess for medication changes as needed Toleration medication: Yes, no side effects reported at this time Family/Significant other contact made: Yes Patient understands diagnosis: No  Limited insight Discussing patient identified problems/goals with staff: Yes Medical problems stabilized or resolved: Yes Denies suicidal/homicidal ideation: No, pt recently admitted with SI. Issues/concerns per patient self-inventory: None Other: N/A  New problem(s) identified: None identified at this time.   New Short Term/Long Term Goal(s): None identified at this time.   Discharge Plan or Barriers: Pt will return home and follow-up with an outpatient provider.  Reason for Continuation of Hospitalization:   Estimated Length of Stay: D/C today, tomorrow at the latest  Attendees: Patient: 10/05/2016 10:32 AM  Physician: Ursula Alert 10/05/2016 10:32 AM  Nursing: Grayland Ormond, RN 10/05/2016 10:32 AM  RN Care Manager: 10/05/2016 10:32 AM  Social Worker Rod Conseco LCSW 10/05/2016 10:32 AM  Recreational Therapist:  10/05/2016 10:32 AM  Other: Lindell Spar, NP 10/05/2016 10:32 AM  Other:  10/05/2016 10:32 AM  Other: 10/05/2016 10:32 AM  Scribe for Treatment Team: Ripley Fraise LCSW 10/05/2016 10:32 AM

## 2016-10-05 NOTE — Progress Notes (Signed)
  [facility] Adult Case Management Discharge Plan :  Will you be returning to the same living situation after discharge:  Yes,  home At discharge, do you have transportation home?: Yes,  family Do you have the ability to pay for your medications: Yes,  MCD  Release of information consent forms completed and in the chart;  Patient's signature needed at discharge.  Patient to Follow up at: Follow-up Information    [facility].   Specialty:  Behavioral Health Why:  Go to the walk-in clinic within 3 days of d/c for your hospital follow up appointment, or call Reggie King with [facility] at [phone] for help in arranging an appointment Contact information: [address] [place] [address] [phone]           Next level of care provider has access to [place] and Suicide Prevention discussed: Yes,  yes  Have you used any form of tobacco in the last 30 days? (Cigarettes, Smokeless Tobacco, Cigars, and/or Pipes): Yes  Has patient been referred to the Quitline?: Patient refused referral  Patient has been referred for addiction treatment: [place] 10/05/2016, 10:35 AM

## 2016-10-05 NOTE — Progress Notes (Signed)
Did not attend group 

## 2016-10-05 NOTE — Progress Notes (Signed)
D: Pt appears brighter on approach today. Pt active on the unit today, observed attending groups and participating. Pt reports decreased depression, anxiety and SI today. No active suicidal thoughts verbalized by pt today. Pt verbally contracts for safety. Pt denies AVH. Pt reports feeling a lot better today. Pt stated that she's ready to discharge home and will return back home with her mother and sister. A: Medications reviewed with pt. Medications administered as ordered per MD. Verbal support provided. Pt encouraged to attend groups. 15 minute checks performed for safety.  R: Pt compliant with tx

## 2016-10-05 NOTE — Progress Notes (Signed)
[facility] MD Progress Note  10/05/2016 2:57 PM HERBERT AGUINALDO  MRN:  [id number]  Subjective: Patient states " I am anxious , I pick my hair when I am anxious and I had to shave it off . I am able to control my nervousness a little bit today.'   Objective:Patient seen and chart reviewed.Discussed patient with treatment team.  Pt today seen as anxious , has compulsions to pick her hair when nervous and she has been trying to cope with it. Pt advised to attend groups. Pt denies any ADRs. Continue treatment.    Principal Problem:  MDD , recurrent severe without psychosis  Diagnosis:   Patient Active Problem List   Diagnosis Date Noted  . OCD (obsessive compulsive disorder) [F42.9] 10/05/2016  . Major depressive disorder, recurrent episode, mild ([place]) [F33.0] 05/04/2016  . 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 Learning disability [F81.9] 07/18/2015  . 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 Diabetes mellitus ([place]) [E11.9] 07/18/2015  . MDD (major depressive disorder), recurrent, severe, with psychosis ([place]) [F33.3] 07/18/2015  . Hyperlipidemia [E78.5] 07/18/2015  . Severe episode of recurrent major depressive disorder, without psychotic features ([place]) [F33.2]   . Drug overdose [T50.901A]   . Cognitive deficits [R41.89] 10/12/2012  . Generalized anxiety disorder [F41.1] 06/28/2012   Total Time spent with patient: 20 minutes  Past Medical History:  Past Medical History:  Diagnosis Date  . Anxiety   . Bipolar 1 disorder ([place])   . Cognitive deficits   . Depression   . Diabetes mellitus without complication ([place])   . Hypertension   . Mental disorder   . Obesity     Past Surgical History:  Procedure Laterality Date  . CESAREAN SECTION    . CESAREAN SECTION N/A 04/25/2013   Procedure: REPEAT CESAREAN SECTION;  Surgeon: [name], MD;  Location: [facility];  Service: Obstetrics;  Laterality: N/A;  . MASS EXCISION N/A 06/03/2012   Procedure: EXCISION MASS;   Surgeon: [name], MD;  Location: [facility];  Service: ENT;  Laterality: N/A;  Excision uvula mass  . TONSILLECTOMY N/A 06/03/2012   Procedure: TONSILLECTOMY;  Surgeon: [name], MD;  Location: [facility];  Service: ENT;  Laterality: N/A;  . TONSILLECTOMY     Family History:  Family History  Problem Relation Age of Onset  . Hypertension Mother   . Diabetes Father    Social History:  History  Alcohol Use No     History  Drug Use No    Comment: Patient denies    Social History   Social History  . Marital status: Single    Spouse name: N/A  . Number of children: N/A  . Years of education: N/A   Social History Main Topics  . Smoking status: Current Every Day Smoker    Packs/day: 1.00    Years: 7.00    Types: Cigarettes    Last attempt to quit: 06/30/2015  . Smokeless tobacco: Never Used  . Alcohol use No  . Drug use: No     Comment: Patient denies  . Sexual activity: Not Currently   Other Topics Concern  . None   Social History Narrative  . None   Additional Social History:  Pain Medications: See MAR Prescriptions: See MAR Over the Counter: See MAR History of alcohol / drug use?: No history of alcohol / drug abuse Longest period of sobriety (when/how long): NA  Sleep: Fair  Appetite:  Fair  Current Medications: Current Facility-Administered  Medications  Medication Dose Route Frequency Provider Last Rate Last Dose  . acetaminophen (TYLENOL) tablet 650 mg  650 mg Oral Q6H PRN Cobos, Myer Peer, MD      . alum & mag hydroxide-simeth (MAALOX/MYLANTA) 200-200-20 MG/5ML suspension 30 mL  30 mL Oral Q4H PRN Cobos, Myer Peer, MD      . busPIRone (BUSPAR) tablet 15 mg  15 mg Oral BID Cobos, Myer Peer, MD   15 mg at 10/05/16 0745  . darifenacin (ENABLEX) 24 hr tablet 7.5 mg  7.5 mg Oral Daily Nwoko, Agnes I, NP   7.5 mg at 10/05/16 0745  . DULoxetine (CYMBALTA) DR capsule 60 mg  60 mg Oral Daily Cobos, Myer Peer, MD   60 mg  at 10/05/16 0745  . gabapentin (NEURONTIN) capsule 400 mg  400 mg Oral BID Lindell Spar I, NP   400 mg at 10/05/16 0745  . glipiZIDE (GLUCOTROL XL) 24 hr tablet 10 mg  10 mg Oral QPM Nwoko, Agnes I, NP   10 mg at 10/04/16 1724  . hydrochlorothiazide (HYDRODIURIL) tablet 25 mg  25 mg Oral QPM Nwoko, Agnes I, NP   25 mg at 10/04/16 1725  . hydrOXYzine (ATARAX/VISTARIL) tablet 25 mg  25 mg Oral Q6H PRN Dara Hoyer, PA-C   25 mg at 10/04/16 2127  . insulin aspart (novoLOG) injection 0-15 Units  0-15 Units Subcutaneous TID WC Lindell Spar I, NP   8 Units at 10/05/16 1200  . insulin aspart (novoLOG) injection 4 Units  4 Units Subcutaneous TID WC Nwoko, Agnes I, NP   4 Units at 10/05/16 1200  . magnesium hydroxide (MILK OF MAGNESIA) suspension 30 mL  30 mL Oral Daily PRN Cobos, Fernando A, MD      . metFORMIN (GLUCOPHAGE) tablet 500 mg  500 mg Oral BID WC Nwoko, Agnes I, NP   500 mg at 10/05/16 0746  . nicotine polacrilex (NICORETTE) gum 2 mg  2 mg Oral PRN Cobos, Myer Peer, MD   2 mg at 10/05/16 1447  . ondansetron (ZOFRAN) tablet 4 mg  4 mg Oral Q8H PRN Lindell Spar I, NP   4 mg at 10/05/16 0747  . pantoprazole (PROTONIX) EC tablet 40 mg  40 mg Oral Daily Lindell Spar I, NP   40 mg at 10/05/16 0746  . traZODone (DESYREL) tablet 50 mg  50 mg Oral QHS PRN Dara Hoyer, PA-C   50 mg at 10/04/16 2127   Lab Results:  Results for orders placed or performed during the hospital encounter of 10/01/16 (from the past 48 hour(s))  Glucose, capillary     Status: Abnormal   Collection Time: 10/03/16  5:04 PM  Result Value Ref Range   Glucose-Capillary 226 (H) 65 - 99 mg/dL  Glucose, capillary     Status: Abnormal   Collection Time: 10/03/16  9:03 PM  Result Value Ref Range   Glucose-Capillary 207 (H) 65 - 99 mg/dL  Glucose, capillary     Status: Abnormal   Collection Time: 10/04/16  6:08 AM  Result Value Ref Range   Glucose-Capillary 183 (H) 65 - 99 mg/dL  Glucose, capillary     Status: Abnormal    Collection Time: 10/04/16 11:48 AM  Result Value Ref Range   Glucose-Capillary 220 (H) 65 - 99 mg/dL  Glucose, capillary     Status: Abnormal   Collection Time: 10/04/16  5:12 PM  Result Value Ref Range   Glucose-Capillary 262 (H) 65 - 99 mg/dL  Glucose,  capillary     Status: Abnormal   Collection Time: 10/04/16  8:40 PM  Result Value Ref Range   Glucose-Capillary 233 (H) 65 - 99 mg/dL   Comment 1 Notify RN   Glucose, capillary     Status: Abnormal   Collection Time: 10/05/16  5:59 AM  Result Value Ref Range   Glucose-Capillary 255 (H) 65 - 99 mg/dL   Comment 1 Notify RN   Glucose, capillary     Status: Abnormal   Collection Time: 10/05/16 11:54 AM  Result Value Ref Range   Glucose-Capillary 265 (H) 65 - 99 mg/dL   Blood Alcohol level:  Lab Results  Component Value Date   ETH <5 09/30/2016   ETH <5 69/62/9528   Metabolic Disorder Labs: Lab Results  Component Value Date   HGBA1C 7.8 (H) 07/18/2015   MPG 177 07/18/2015   MPG 166 05/11/2015   Lab Results  Component Value Date   PROLACTIN 37.3 (H) 07/18/2015   PROLACTIN 42.0 (H) 05/11/2015   Lab Results  Component Value Date   CHOL 212 (H) 07/18/2015   TRIG 496 (H) 07/18/2015   HDL 40 (L) 07/18/2015   CHOLHDL 5.3 07/18/2015   VLDL UNABLE TO CALCULATE IF TRIGLYCERIDE OVER 400 mg/dL 07/18/2015   LDLCALC UNABLE TO CALCULATE IF TRIGLYCERIDE OVER 400 mg/dL 07/18/2015   LDLCALC 116 (H) 05/11/2015   Physical Findings: AIMS: Facial and Oral Movements Muscles of Facial Expression: None, normal Lips and Perioral Area: None, normal Jaw: None, normal Tongue: None, normal,Extremity Movements Upper (arms, wrists, hands, fingers): None, normal Lower (legs, knees, ankles, toes): None, normal, Trunk Movements Neck, shoulders, hips: None, normal, Overall Severity Severity of abnormal movements (highest score from questions above): None, normal Incapacitation due to abnormal movements: None, normal Patient's awareness of  abnormal movements (rate only patient's report): No Awareness, Dental Status Current problems with teeth and/or dentures?: No Does patient usually wear dentures?: No  CIWA:    COWS:     Musculoskeletal: Strength & Muscle Tone: within normal limits Gait & Station: normal Patient leans: N/A  Psychiatric Specialty Exam: Physical Exam  Nursing note and vitals reviewed.   Review of Systems  Psychiatric/Behavioral: Positive for depression. The patient is nervous/anxious.   All other systems reviewed and are negative.    Blood pressure 134/86, pulse (!) 116, temperature 98.5 F (36.9 C), temperature source Oral, resp. rate 20, height 5\' 7"  (1.702 m), weight 122.9 kg (271 lb), SpO2 100 %.Body mass index is 42.44 kg/m.  General Appearance: Fairly Groomed  Eye Contact:  Fair  Speech:  Normal Rate  Volume:  Decreased  Mood:  Anxious and Depressed  Affect:  Congruent  Thought Process:  Linear and Descriptions of Associations: Circumstantial  Orientation:  Full (Time, Place, and Person)  Thought Content:  Rumination  Suicidal Thoughts:  No   Homicidal Thoughts:  No   Memory:  Immediate;   Fair Recent;   Fair Remote;   Fair  Judgement:  Fair  Insight:  Fair  Psychomotor Activity:  Decreased  Concentration:  Concentration: Fair and Attention Span: Fair  Recall:  [employer] of Knowledge:  Fair  Language:  Fair  Akathisia:  Negative  Handed:  Right  AIMS (if indicated):     Assets:  Desire for Improvement Resilience  ADL's:  Fair   Cognition:  WNL  Sleep:  Number of Hours: 6.75     Treatment Plan Summary:Patient  With OCD sx , continues to improve, will continue medications. Daily  contact with patient to assess and evaluate symptoms and progress in treatment, Medication management, Plan see below and as noted   Buspar 15 mg po bid for anxiety sx. Cymbalta 60 mg po daily for OCD sx. Neurontin 400 mg po bid for anxiety sx. Trazodone 50 mg po qhs prn for insomnia. Will order  tsh. CSW will continue to work on disposition.    Cayman Brogden, MD 10/05/2016, 2:57 PMPatient ID: [name], female   DOB: 16-Jan-1991, 26 y.o.   MRN: [id number]

## 2016-10-05 NOTE — BHH Group Notes (Signed)
[facility] LCSW Group Therapy  10/05/2016 1:15 pm  Type of Therapy: Process Group Therapy  Participation Level:  Active  Participation Quality:  Appropriate  Affect:  Flat  Cognitive:  Oriented  Insight:  Improving  Engagement in Group:  Limited  Engagement in Therapy:  Limited  Modes of Intervention:  Activity, Clarification, Education, Problem-solving and Support  Summary of Progress/Problems: Today's group addressed the issue of overcoming obstacles.  Patients were asked to identify their biggest obstacle post d/c that stands in the way of their on-going success, and then problem solve as to how to manage this. Stayed for the first 20 minutes.  Minimally engaged.  Helped contribute to the definition of obstacle, but identified none for herself.  Appeared to be RIS.  Trish Mage 10/05/2016   4:33 PM

## 2016-10-06 LAB — GLUCOSE, CAPILLARY
GLUCOSE-CAPILLARY: 240 mg/dL — AB (ref 65–99)
GLUCOSE-CAPILLARY: 168 mg/dL — AB (ref 65–99)

## 2016-10-06 MED ORDER — OMEPRAZOLE 20 MG PO CPDR: 20.0000 mg | DELAYED_RELEASE_CAPSULE | Freq: Every day | ORAL | 0 refills | Status: DC

## 2016-10-06 MED ORDER — GLIPIZIDE ER 10 MG PO TB24: 10.0000 mg | ORAL_TABLET | Freq: Every evening | ORAL | 0 refills | Status: DC

## 2016-10-06 MED ORDER — DULOXETINE HCL 60 MG PO CPEP: 60.0000 mg | ORAL_CAPSULE | Freq: Every day | ORAL | 0 refills | Status: DC

## 2016-10-06 MED ORDER — TRAZODONE HCL 50 MG PO TABS: 50.0000 mg | ORAL_TABLET | Freq: Every evening | ORAL | 0 refills | Status: DC | PRN

## 2016-10-06 MED ORDER — GABAPENTIN 400 MG PO CAPS: 400.0000 mg | ORAL_CAPSULE | Freq: Two times a day (BID) | ORAL | 0 refills | Status: DC

## 2016-10-06 MED ORDER — SOLIFENACIN SUCCINATE 5 MG PO TABS: 5.0000 mg | ORAL_TABLET | Freq: Every day | ORAL | Status: DC

## 2016-10-06 MED ORDER — METFORMIN HCL 500 MG PO TABS: 500.0000 mg | ORAL_TABLET | Freq: Two times a day (BID) | ORAL | 0 refills | Status: DC

## 2016-10-06 MED ORDER — ONDANSETRON HCL 4 MG PO TABS: 4.0000 mg | ORAL_TABLET | Freq: Three times a day (TID) | ORAL | 0 refills | Status: DC | PRN

## 2016-10-06 MED ORDER — BUSPIRONE HCL 15 MG PO TABS: 15.0000 mg | ORAL_TABLET | Freq: Two times a day (BID) | ORAL | 0 refills | Status: DC

## 2016-10-06 MED ORDER — HYDROCHLOROTHIAZIDE 25 MG PO TABS: 25.0000 mg | ORAL_TABLET | Freq: Every evening | ORAL | 0 refills | Status: DC

## 2016-10-06 MED ORDER — NICOTINE POLACRILEX 2 MG MT GUM: 2.0000 mg | CHEWING_GUM | OROMUCOSAL | 0 refills | Status: DC | PRN

## 2016-10-06 MED ORDER — HYDROXYZINE HCL 25 MG PO TABS: 25.0000 mg | ORAL_TABLET | Freq: Four times a day (QID) | ORAL | 0 refills | Status: DC | PRN

## 2016-10-06 NOTE — BHH Suicide Risk Assessment (Signed)
[facility] Discharge Suicide Risk Assessment   Principal Problem: Severe episode of recurrent major depressive disorder, without psychotic features [facility]) Discharge Diagnoses:  Patient Active Problem List   Diagnosis Date Noted  . OCD (obsessive compulsive disorder) [F42.9] 10/05/2016  . Major depressive disorder, recurrent episode, mild ([place]) [F33.0] 05/04/2016  . 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 Learning disability [F81.9] 07/18/2015  . 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 Diabetes mellitus ([place]) [E11.9] 07/18/2015  . MDD (major depressive disorder), recurrent, severe, with psychosis ([place]) [F33.3] 07/18/2015  . Hyperlipidemia [E78.5] 07/18/2015  . Severe episode of recurrent major depressive disorder, without psychotic features ([place]) [F33.2]   . Drug overdose [T50.901A]   . Cognitive deficits [R41.89] 10/12/2012  . Generalized anxiety disorder [F41.1] 06/28/2012    Total Time spent with patient: 30 minutes  Musculoskeletal: Strength & Muscle Tone: within normal limits Gait & Station: normal Patient leans: N/A  Psychiatric Specialty Exam: Review of Systems  Psychiatric/Behavioral: Negative for depression, hallucinations and suicidal ideas. The patient is not nervous/anxious.   All other systems reviewed and are negative.   Blood pressure 134/86, pulse (!) 116, temperature 98.5 F (36.9 C), temperature source Oral, resp. rate 20, height 5\' 7"  (1.702 m), weight 122.9 kg (271 lb), SpO2 100 %.Body mass index is 42.44 kg/m.  General Appearance: Casual  Eye Contact::  Fair  Speech:  Clear and Coherent409  Volume:  Normal  Mood:  Euthymic  Affect:  Congruent  Thought Process:  Goal Directed and Descriptions of Associations: Intact  Orientation:  Full (Time, Place, and Person)  Thought Content:  Logical  Suicidal Thoughts:  No  Homicidal Thoughts:  No  Memory:  Immediate;   Fair Recent;   Fair Remote;   Fair  Judgement:  Fair  Insight:  Fair   Psychomotor Activity:  Normal  Concentration:  Fair  Recall:  [employer] of Knowledge:Fair  Language: Fair  Akathisia:  No  Handed:  Right  AIMS (if indicated):     Assets:  Communication Skills Desire for Improvement  Sleep:  Number of Hours: 5.75  Cognition: WNL  ADL's:  Intact   Mental Status Per Nursing Assessment::   On Admission:  Suicidal ideation indicated by patient  Demographic Factors:  NA  Loss Factors: NA  Historical Factors: Impulsivity  Risk Reduction Factors:   Positive social support  Continued Clinical Symptoms:  Previous Psychiatric Diagnoses and Treatments Medical Diagnoses and Treatments/Surgeries  Cognitive Features That Contribute To Risk:  None    Suicide Risk:  Minimal: No identifiable suicidal ideation.  Patients presenting with no risk factors but with morbid ruminations; may be classified as minimal risk based on the severity of the depressive symptoms  Follow-up Information    [facility] Follow up on 10/06/2016.   Specialty:  Behavioral Health Why:   Tuesday at 12:30 for your hospital follow up appointment. call Myles Gip with [facility] at [phone] if you need help in arranging transportation. Contact information: [place] [address] [phone]           Plan Of Care/Follow-up recommendations:  Activity:  no restrictions Diet:  regular Tests:  as needed Other:  follow up with aftercare  Amadeo Coke, MD 10/06/2016, 9:21 AM

## 2016-10-06 NOTE — Progress Notes (Signed)
Inpatient Diabetes Program Recommendations  AACE/ADA: New Consensus Statement on Inpatient Glycemic Control (2015)  Target Ranges:  Prepandial:   less than 140 mg/dL      Peak postprandial:   less than 180 mg/dL (1-2 hours)      Critically ill patients:  140 - 180 mg/dL   Results for [name], [name] (MRN [id number]) as of 10/06/2016 10:18  Ref. Range 10/05/2016 05:59 10/05/2016 11:54 10/05/2016 17:18 10/05/2016 21:12 10/06/2016 06:13  Glucose-Capillary Latest Ref Range: 65 - 99 mg/dL 255 (H) 265 (H) 251 (H) 259 (H) 240 (H)   Review of Glycemic Control  Diabetes history: DM 2 Outpatient Diabetes medications: Glipizide 10 mg Q evening, Metformin 500 mg BID Current orders for Inpatient glycemic control: Glipizide 10 mg Q evening, Metformin 500 mg BID, Novolog Moderate + HS scale  Inpatient Diabetes Program Recommendations:    Glucose trend in the mid 200's. Consider low dose basal insulin while here, Lantus 16 units Q 24 hours (almost 0.15 units/kg).  Thanks,  Tama Headings RN, MSN, [facility] Inpatient Diabetes Coordinator Team Pager [phone] (8a-5p)

## 2016-10-06 NOTE — Progress Notes (Signed)
Pt discharged to lobby. Pt was stable and appreciative at that time. All papers and prescriptions were given and valuables returned. Verbal understanding expressed. Denies SI/HI and A/VH. Pt given opportunity to express concerns and ask questions.  

## 2016-10-06 NOTE — Progress Notes (Signed)
Recreation Therapy Notes  Date: 10/06/16 Time: 1000 Location: 500 Hall Dayroom  Group Topic: Communication  Goal Area(s) Addresses:  Patient will effectively communicate with peers in group.  Patient will verbalize benefit of healthy communication. Patient will verbalize positive effect of healthy communication on post d/c goals.  Patient will identify communication techniques that made activity effective for group.   Intervention:  Blank paper, pencils, drawings     Activity: Back-to-Back Drawing.  Patients were divided into teams of two.  One person was designated the speaker and the other the listener.  The speaker was given a picture in which they had to give instructions to the listener in a way in which they would be able to draw the picture on a separate piece of paper.  Patients would then switch rolls and change pictures.  Education: Communication, Discharge Planning  Education Outcome: Acknowledges understanding/In group clarification offered/Needs additional education.   Clinical Observations/Feedback: Pt did not attend group.    [name], LRT/CTRS         [name] A 10/06/2016 1:03 PM

## 2016-10-06 NOTE — Discharge Summary (Signed)
Physician Discharge Summary Note  Patient:  [name] is an 26 y.o., female MRN:  [id number] DOB:  [birth year] Patient phone:  [phone] (home)  Patient address:   [place] [address],   Total Time spent with patient: Greater than 30 minutes  Date of Admission:  10/01/2016  Date of Discharge: 10-06-16  Reason for Admission:  Mood stabilization treatment.  Principal Problem: Severe episode of recurrent major depressive disorder, without psychotic features [name]) Discharge Diagnoses: Patient Active Problem List   Diagnosis Date Noted  . OCD (obsessive compulsive disorder) [F42.9] 10/05/2016  . Major depressive disorder, recurrent episode, mild ([place]) [F33.0] 05/04/2016  . 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 Learning disability [F81.9] 07/18/2015  . 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 Diabetes mellitus ([place]) [E11.9] 07/18/2015  . MDD (major depressive disorder), recurrent, severe, with psychosis ([place]) [F33.3] 07/18/2015  . Hyperlipidemia [E78.5] 07/18/2015  . Severe episode of recurrent major depressive disorder, without psychotic features ([place]) [F33.2]   . Drug overdose [T50.901A]   . Cognitive deficits [R41.89] 10/12/2012  . Generalized anxiety disorder [F41.1] 06/28/2012   Musculoskeletal: Strength & Muscle Tone: within normal limits Gait & Station: normal Patient leans: N/A  Psychiatric Specialty Exam: Physical Exam  Constitutional: She is oriented to person, place, and time. She appears well-developed.  HENT:  Head: Normocephalic.  Eyes: Pupils are equal, round, and reactive to light.  Neck: Normal range of motion.  Cardiovascular: Normal rate.   Respiratory: Effort normal.  GI: Soft.  Genitourinary:  Genitourinary Comments: Denies any issues in this area  Musculoskeletal: Normal range of motion.  Neurological: She is alert and oriented to person, place, and time.  Skin: Skin is warm and dry.   Psychiatric: Her speech is normal and behavior is normal. Judgment normal. Her mood appears not anxious. Her affect is not angry, not blunt, not labile and not inappropriate. Cognition and memory are normal. She does not exhibit a depressed mood.    Review of Systems  Constitutional: Negative.   HENT: Negative.   Eyes: Negative.   Respiratory: Negative.   Cardiovascular: Negative.   Gastrointestinal: Negative.   Genitourinary: Negative.   Musculoskeletal: Negative.   Skin: Negative.   Endo/Heme/Allergies: Negative.   Psychiatric/Behavioral: Positive for depression and hallucinations (Hx of psychosis). Negative for memory loss, substance abuse and suicidal ideas. The patient has insomnia (Stable). The patient is not nervous/anxious.     Blood pressure 134/86, pulse (!) 116, temperature 98.5 F (36.9 C), temperature source Oral, resp. rate 20, height 5\' 7"  (1.702 m), weight 122.9 kg (271 lb), SpO2 100 %.Body mass index is 42.44 kg/m.  See Md's SRA   Have you used any form of tobacco in the last 30 days? (Cigarettes, Smokeless Tobacco, Cigars, and/or Pipes): Yes  Has this patient used any form of tobacco in the last 30 days? (Cigarettes, Smokeless Tobacco, Cigars, and/or Pipes) No  Past Medical History:  Past Medical History:  Diagnosis Date  . Anxiety   . Bipolar 1 disorder ([place])   . Cognitive deficits   . Depression   . Diabetes mellitus without complication ([place])   . Hypertension   . Mental disorder   . Obesity     Past Surgical History:  Procedure Laterality Date  . CESAREAN SECTION    . CESAREAN SECTION N/A 04/25/2013   Procedure: REPEAT CESAREAN SECTION;  Surgeon: [name], MD;  Location: [facility] [place] [facility];  Service: Obstetrics;  Laterality: N/A;  . MASS EXCISION N/A  06/03/2012   Procedure: EXCISION MASS;  Surgeon: [name], MD;  Location: [place];  Service: ENT;  Laterality: N/A;  Excision uvula mass  . TONSILLECTOMY N/A 06/03/2012   Procedure:  TONSILLECTOMY;  Surgeon: [name], MD;  Location: [place];  Service: ENT;  Laterality: N/A;  . TONSILLECTOMY     Family History:  Family History  Problem Relation Age of Onset  . Hypertension Mother   . Diabetes Father    Social History:  History  Alcohol Use No     History  Drug Use No    Comment: Patient denies    Social History   Social History  . Marital status: Single    Spouse name: N/A  . Number of children: N/A  . Years of education: N/A   Social History Main Topics  . Smoking status: Current Every Day Smoker    Packs/day: 1.00    Years: 7.00    Types: Cigarettes    Last attempt to quit: 06/30/2015  . Smokeless tobacco: Never Used  . Alcohol use No  . Drug use: No     Comment: Patient denies  . Sexual activity: Not Currently   Other Topics Concern  . None   Social History Narrative  . None   Risk to Self: Is patient at risk for suicide?: Yes What has been your use of drugs/alcohol within the last 12 months?: Pt denies use  Risk to Others: No Prior Inpatient Therapy: Yes Prior Outpatient Therapy: Yes  Level of Care:  OP  Hospital Course: (per admission assessment): [name] is 26 year old female, who presented to the ED voluntarily due to worsening depression and suicidal ideations of overdosing on medications. She attributes her worsening depression to anxiety and describes OCD type symptoms. She states she has obsessive thoughts and compulsions regarding shaving her head (she is bald) and shaves it daily. She also applies bleach to her scalp several times a week. States " I do not want to do it, I want to let my hair grow, but if I don't shave my nerves get really bad ".   [name] is known in this [facility] adult unit from previous hospitalizations. Her presenting complaints has been worsening symptoms of depression triggering suicidal ideations. She presented an obsessive thoughts of shaving off her hair & she is bald.This time around,  she was admitted for mood stabilization treatments.  During the course this hospitalization, she was medicated & discharged on; Duloxetine 60 mg for depression, Buspar 15 mg for anxiety, Gabapentin 400 mg for agitation, Hydroxyzine 25 mg prn for anxiety & Trazodone 50 mg for insomnia . She was enrolled & participated in the group counseling sessions being offered & held on this unit. She learned coping skills that should help her cope better & manage her symptoms after discharge. She was resumed on all her pertinent home medications for all her other pre-existing medical issues presented. She tolerated her treatment regimen without any adverse effects or reactions reported.   [name]'s symptoms responded well to her treatment regimen. This is evidenced by her reports of improved mood & absence of suicidal ideations. She is currently being discharged to her home to follow-up care as noted below. She was provided with all the necessary information needed to make this appointment without problems. Upon her hospital discharge, [name] denies any SIHI plans or intent, delusional thoughts & or paranoia. She left [facility] in no apparent distress. Transportation per her family.  Consults:  psychiatry  Discharge Vitals:   Blood pressure 134/86, pulse (!) 116, temperature 98.5 F (36.9 C), temperature source Oral, resp. rate 20, height 5\' 7"  (1.702 m), weight 122.9 kg (271 lb), SpO2 100 %. Body mass index is 42.44 kg/m.  Lab Results:   Results for orders placed or performed during the hospital encounter of 10/01/16 (from the past 72 hour(s))  Glucose, capillary     Status: Abnormal   Collection Time: 10/03/16  5:04 PM  Result Value Ref Range   Glucose-Capillary 226 (H) 65 - 99 mg/dL  Glucose, capillary     Status: Abnormal   Collection Time: 10/03/16  9:03 PM  Result Value Ref Range   Glucose-Capillary 207 (H) 65 - 99 mg/dL  Glucose, capillary     Status: Abnormal   Collection Time: 10/04/16  6:08 AM   Result Value Ref Range   Glucose-Capillary 183 (H) 65 - 99 mg/dL  Glucose, capillary     Status: Abnormal   Collection Time: 10/04/16 11:48 AM  Result Value Ref Range   Glucose-Capillary 220 (H) 65 - 99 mg/dL  Glucose, capillary     Status: Abnormal   Collection Time: 10/04/16  5:12 PM  Result Value Ref Range   Glucose-Capillary 262 (H) 65 - 99 mg/dL  Glucose, capillary     Status: Abnormal   Collection Time: 10/04/16  8:40 PM  Result Value Ref Range   Glucose-Capillary 233 (H) 65 - 99 mg/dL   Comment 1 Notify RN   Glucose, capillary     Status: Abnormal   Collection Time: 10/05/16  5:59 AM  Result Value Ref Range   Glucose-Capillary 255 (H) 65 - 99 mg/dL   Comment 1 Notify RN   Glucose, capillary     Status: Abnormal   Collection Time: 10/05/16 11:54 AM  Result Value Ref Range   Glucose-Capillary 265 (H) 65 - 99 mg/dL  Glucose, capillary     Status: Abnormal   Collection Time: 10/05/16  5:18 PM  Result Value Ref Range   Glucose-Capillary 251 (H) 65 - 99 mg/dL   Comment 1 Notify RN    Comment 2 Document in Chart   TSH     Status: None   Collection Time: 10/05/16  6:15 PM  Result Value Ref Range   TSH 1.936 0.350 - 4.500 uIU/mL    Comment: Performed by a 3rd Generation assay with a functional sensitivity of <=0.01 uIU/mL. Performed at [facility], [place] [address], [place], [place] [address]   Glucose, capillary     Status: Abnormal   Collection Time: 10/05/16  9:12 PM  Result Value Ref Range   Glucose-Capillary 259 (H) 65 - 99 mg/dL  Glucose, capillary     Status: Abnormal   Collection Time: 10/06/16  6:13 AM  Result Value Ref Range   Glucose-Capillary 240 (H) 65 - 99 mg/dL   Physical Findings: AIMS: Facial and Oral Movements Muscles of Facial Expression: None, normal Lips and Perioral Area: None, normal Jaw: None, normal Tongue: None, normal,Extremity Movements Upper (arms, wrists, hands, fingers): None, normal Lower (legs, knees, ankles,  toes): None, normal, Trunk Movements Neck, shoulders, hips: None, normal, Overall Severity Severity of abnormal movements (highest score from questions above): None, normal Incapacitation due to abnormal movements: None, normal Patient's awareness of abnormal movements (rate only patient's report): No Awareness, Dental Status Current problems with teeth and/or dentures?: No Does patient usually wear dentures?: No  CIWA:    COWS:     See Psychiatric Specialty  Exam and Suicide Risk Assessment completed by Attending Physician prior to discharge.  Discharge destination:  Home  Is patient on multiple antipsychotic therapies at discharge:  No   Has Patient had three or more failed trials of antipsychotic monotherapy by history:  No  Recommended Plan for Multiple Antipsychotic Therapies: NA  Allergies as of 10/06/2016      Reactions   Wellbutrin [bupropion] Shortness Of Breath   Omnipaque [iohexol] Swelling, Other (See Comments)   Reaction:  Eye swelling   Penicillins Hives, Other (See Comments)   Has patient had a PCN reaction causing immediate rash, facial/tongue/throat swelling, SOB or lightheadedness with hypotension: No Has patient had a PCN reaction causing severe rash involving mucus membranes or skin necrosis: No Has patient had a PCN reaction that required hospitalization No Has patient had a PCN reaction occurring within the last 10 years: No If all of the above answers are "NO", then may proceed with Cephalosporin use.      Medication List    STOP taking these medications   fluvoxaMINE 100 MG tablet Commonly known as:  LUVOX   INVEGA SUSTENNA 234 MG/1.5ML Susp injection Generic drug:  paliperidone   ondansetron 8 MG disintegrating tablet Commonly known as:  ZOFRAN ODT   promethazine 25 MG tablet Commonly known as:  PHENERGAN     TAKE these medications     Indication  busPIRone 15 MG tablet Commonly known as:  BUSPAR Take 1 tablet (15 mg total) by mouth 2 (two)  times daily. For anxiety What changed:  when to take this  additional instructions  Indication:  Anxiety Disorder   DULoxetine 60 MG capsule Commonly known as:  CYMBALTA Take 1 capsule (60 mg total) by mouth daily. For depression Start taking on:  10/07/2016 What changed:  when to take this  additional instructions  Indication:  Major Depressive Disorder   gabapentin 400 MG capsule Commonly known as:  NEURONTIN Take 1 capsule (400 mg total) by mouth 2 (two) times daily. For agitation What changed:  additional instructions  Indication:  Agitation   glipiZIDE 10 MG 24 hr tablet Commonly known as:  GLUCOTROL XL Take 1 tablet (10 mg total) by mouth every evening. For diabetes management What changed:  additional instructions  Indication:  Type 2 Diabetes   hydrochlorothiazide 25 MG tablet Commonly known as:  HYDRODIURIL Take 1 tablet (25 mg total) by mouth every evening. For high blood pressure What changed:  additional instructions  Indication:  High Blood Pressure Disorder   hydrOXYzine 25 MG tablet Commonly known as:  ATARAX/VISTARIL Take 1 tablet (25 mg total) by mouth every 6 (six) hours as needed for anxiety.  Indication:  Anxiety Neurosis   metFORMIN 500 MG tablet Commonly known as:  GLUCOPHAGE Take 1 tablet (500 mg total) by mouth 2 (two) times daily with a meal. For diabetes management What changed:  additional instructions  Indication:  Type 2 Diabetes   nicotine polacrilex 2 MG gum Commonly known as:  NICORETTE Take 1 each (2 mg total) by mouth as needed for smoking cessation.  Indication:  Nicotine Addiction   omeprazole 20 MG capsule Commonly known as:  PRILOSEC Take 1 capsule (20 mg total) by mouth daily. For acid reflux What changed:  additional instructions  Indication:  Gastroesophageal Reflux Disease   ondansetron 4 MG tablet Commonly known as:  ZOFRAN Take 1 tablet (4 mg total) by mouth every 8 (eight) hours as needed for nausea or  vomiting.  Indication:  Nausea, vomiting  solifenacin 5 MG tablet Commonly known as:  VESICARE Take 1 tablet (5 mg total) by mouth daily. For weak bladder What changed:  additional instructions  Indication:  Overactive Bladder, Frequent Urination, Urinary Urgency   traZODone 50 MG tablet Commonly known as:  DESYREL Take 1 tablet (50 mg total) by mouth at bedtime as needed for sleep.  Indication:  Trouble Sleeping      Follow-up Information    [facility] Follow up on 10/06/2016.   Specialty:  Behavioral Health Why:   Tuesday at 12:30 for your hospital follow up appointment. call Myles Gip with [facility] at [phone] if you need help in arranging transportation. Contact information: [place] [address] [phone]          Follow-up recommendations: Activity:  As tolerated Diet: As recommended by your primary care doctor. Keep all scheduled follow-up appointments as recommended.   Comments: Patient is instructed prior to discharge to: Take all medications as prescribed by his/her mental healthcare provider. Report any adverse effects and or reactions from the medicines to his/her outpatient provider promptly. Patient has been instructed & cautioned: To not engage in alcohol and or illegal drug use while on prescription medicines. In the event of worsening symptoms, patient is instructed to call the crisis hotline, 911 and or go to the nearest ED for appropriate evaluation and treatment of symptoms. To follow-up with his/her primary care provider for your other medical issues, concerns and or health care needs.    Signed: Encarnacion Slates, PMHNP, FNP-BC 10/06/2016, 11:55 AM

## 2016-11-05 ENCOUNTER — Emergency Department (HOSPITAL_COMMUNITY)
Admission: EM | Admit: 2016-11-05 | Discharge: 2016-11-06 | Disposition: A | Discharge status: Home / Self Care | Payer: Medicaid Other | Attending: Emergency Medicine | Admitting: Emergency Medicine

## 2016-11-05 ENCOUNTER — Encounter (HOSPITAL_COMMUNITY): Payer: Self-pay | Admitting: Nurse Practitioner

## 2016-11-05 DIAGNOSIS — E119 Type 2 diabetes mellitus without complications: Secondary | ICD-10-CM | POA: Insufficient documentation

## 2016-11-05 DIAGNOSIS — R4589 Other symptoms and signs involving emotional state: Secondary | ICD-10-CM | POA: Diagnosis not present

## 2016-11-05 DIAGNOSIS — F411 Generalized anxiety disorder: Secondary | ICD-10-CM | POA: Diagnosis present

## 2016-11-05 DIAGNOSIS — I10 Essential (primary) hypertension: Secondary | ICD-10-CM | POA: Insufficient documentation

## 2016-11-05 DIAGNOSIS — R45851 Suicidal ideations: Secondary | ICD-10-CM | POA: Insufficient documentation

## 2016-11-05 DIAGNOSIS — Z7984 Long term (current) use of oral hypoglycemic drugs: Secondary | ICD-10-CM | POA: Diagnosis not present

## 2016-11-05 DIAGNOSIS — F330 Major depressive disorder, recurrent, mild: Secondary | ICD-10-CM | POA: Diagnosis present

## 2016-11-05 DIAGNOSIS — Z79899 Other long term (current) drug therapy: Secondary | ICD-10-CM | POA: Insufficient documentation

## 2016-11-05 DIAGNOSIS — F17210 Nicotine dependence, cigarettes, uncomplicated: Secondary | ICD-10-CM | POA: Diagnosis not present

## 2016-11-05 LAB — CBC WITH DIFFERENTIAL/PLATELET
BASOS ABS: 0 10*3/uL (ref 0.0–0.1)
BASOS PCT: 1 %
EOS ABS: 0.3 10*3/uL (ref 0.0–0.7)
Eosinophils Relative: 3 %
HEMATOCRIT: 38 % (ref 36.0–46.0)
HEMOGLOBIN: 12.8 g/dL (ref 12.0–15.0)
Lymphocytes Relative: 36 %
Lymphs Abs: 3.1 10*3/uL (ref 0.7–4.0)
MCH: 26.6 pg (ref 26.0–34.0)
MCHC: 33.7 g/dL (ref 30.0–36.0)
MCV: 78.8 fL (ref 78.0–100.0)
MONOS PCT: 6 %
Monocytes Absolute: 0.5 10*3/uL (ref 0.1–1.0)
NEUTROS ABS: 4.7 10*3/uL (ref 1.7–7.7)
NEUTROS PCT: 54 %
Platelets: 212 10*3/uL (ref 150–400)
RBC: 4.82 MIL/uL (ref 3.87–5.11)
RDW: 14.4 % (ref 11.5–15.5)
WBC: 8.6 10*3/uL (ref 4.0–10.5)

## 2016-11-05 LAB — COMPREHENSIVE METABOLIC PANEL
ALBUMIN: 4.5 g/dL (ref 3.5–5.0)
ALK PHOS: 48 U/L (ref 38–126)
ALT: 42 U/L (ref 14–54)
ANION GAP: 12 (ref 5–15)
AST: 46 U/L — AB (ref 15–41)
BILIRUBIN TOTAL: 0.6 mg/dL (ref 0.3–1.2)
BUN: 12 mg/dL (ref 6–20)
CALCIUM: 9.6 mg/dL (ref 8.9–10.3)
CO2: 25 mmol/L (ref 22–32)
Chloride: 104 mmol/L (ref 101–111)
Creatinine, Ser: 0.87 mg/dL (ref 0.44–1.00)
GFR calc Af Amer: >60 mL/min (ref >60)
GFR calc non Af Amer: >60 mL/min (ref >60)
GLUCOSE: 152 mg/dL — AB (ref 65–99)
Potassium: 3.5 mmol/L (ref 3.5–5.1)
SODIUM: 141 mmol/L (ref 135–145)
Total Protein: 7.9 g/dL (ref 6.5–8.1)

## 2016-11-05 LAB — ETHANOL: Alcohol, Ethyl (B): <5 mg/dL (ref <5)

## 2016-11-05 LAB — ACETAMINOPHEN LEVEL

## 2016-11-05 LAB — HCG, QUANTITATIVE, PREGNANCY: hCG, Beta Chain, Quant, S: <1 m[IU]/mL (ref <5)

## 2016-11-05 LAB — I-STAT BETA HCG BLOOD, ED (MC, WL, AP ONLY)

## 2016-11-05 LAB — SALICYLATE LEVEL

## 2016-11-05 MED ORDER — ALUM & MAG HYDROXIDE-SIMETH 200-200-20 MG/5ML PO SUSP: 30.0000 mL | Freq: Four times a day (QID) | ORAL | Status: DC | PRN

## 2016-11-05 MED ORDER — NICOTINE 21 MG/24HR TD PT24
21.0000 mg | MEDICATED_PATCH | Freq: Every day | TRANSDERMAL | Status: DC
  Filled 2016-11-05: qty 1

## 2016-11-05 MED ORDER — DULOXETINE HCL 30 MG PO CPEP
60.0000 mg | ORAL_CAPSULE | Freq: Every day | ORAL | Status: DC
  Administered 2016-11-05 – 2016-11-06 (×2): 60 mg via ORAL
  Filled 2016-11-05 (×2): qty 2

## 2016-11-05 MED ORDER — BUSPIRONE HCL 10 MG PO TABS
15.0000 mg | ORAL_TABLET | Freq: Two times a day (BID) | ORAL | Status: DC
  Administered 2016-11-05 – 2016-11-06 (×2): 15 mg via ORAL
  Filled 2016-11-05 (×3): qty 2

## 2016-11-05 MED ORDER — PANTOPRAZOLE SODIUM 40 MG PO TBEC
40.0000 mg | DELAYED_RELEASE_TABLET | Freq: Every day | ORAL | Status: DC
Start: 2016-11-05 — End: 2016-11-06
  Administered 2016-11-05 – 2016-11-06 (×2): 40 mg via ORAL
  Filled 2016-11-05 (×2): qty 1

## 2016-11-05 MED ORDER — ZOLPIDEM TARTRATE 5 MG PO TABS: 5.0000 mg | ORAL_TABLET | Freq: Every evening | ORAL | Status: DC | PRN

## 2016-11-05 MED ORDER — METFORMIN HCL 500 MG PO TABS
500.0000 mg | ORAL_TABLET | Freq: Two times a day (BID) | ORAL | Status: DC
  Administered 2016-11-06: 500 mg via ORAL
  Filled 2016-11-05: qty 1

## 2016-11-05 MED ORDER — GLIPIZIDE ER 10 MG PO TB24
10.0000 mg | ORAL_TABLET | Freq: Every evening | ORAL | Status: DC
  Administered 2016-11-05: 10 mg via ORAL
  Filled 2016-11-05: qty 1

## 2016-11-05 MED ORDER — GABAPENTIN 400 MG PO CAPS
400.0000 mg | ORAL_CAPSULE | Freq: Two times a day (BID) | ORAL | Status: DC
  Administered 2016-11-05 – 2016-11-06 (×2): 400 mg via ORAL
  Filled 2016-11-05 (×3): qty 1

## 2016-11-05 MED ORDER — PALIPERIDONE PALMITATE 234 MG/1.5ML IM SUSP: 234.0000 mg | INTRAMUSCULAR | Status: DC

## 2016-11-05 MED ORDER — HYDROXYZINE HCL 25 MG PO TABS: 25.0000 mg | ORAL_TABLET | Freq: Four times a day (QID) | ORAL | Status: DC | PRN

## 2016-11-05 MED ORDER — IBUPROFEN 200 MG PO TABS: 600.0000 mg | ORAL_TABLET | Freq: Three times a day (TID) | ORAL | Status: DC | PRN

## 2016-11-05 MED ORDER — ONDANSETRON HCL 4 MG PO TABS: 4.0000 mg | ORAL_TABLET | Freq: Three times a day (TID) | ORAL | Status: DC | PRN

## 2016-11-05 MED ORDER — HYDROCHLOROTHIAZIDE 25 MG PO TABS
25.0000 mg | ORAL_TABLET | Freq: Every evening | ORAL | Status: DC
  Filled 2016-11-05: qty 1

## 2016-11-05 MED ORDER — DARIFENACIN HYDROBROMIDE ER 7.5 MG PO TB24
7.5000 mg | ORAL_TABLET | Freq: Every day | ORAL | Status: DC
  Administered 2016-11-06: 7.5 mg via ORAL
  Filled 2016-11-05: qty 1

## 2016-11-05 NOTE — ED Notes (Signed)
Bed: WA27 Expected date: 11/06/16 Expected time:  Means of arrival:  Comments: [employer]-vol medical clearance

## 2016-11-05 NOTE — ED Notes (Signed)
This patient just came to desk and stated "I'm suicidal because I'm tired of being in that room"  I told pt. She could go into day room

## 2016-11-05 NOTE — ED Provider Notes (Signed)
[facility] DEPT Provider Note   CSN: [id number] Arrival date & time: 11/05/16  1356     History   Chief Complaint No chief complaint on file.   HPI [name] is a 26 y.o. female.  The history is provided by the patient and medical records.    26 y.o. F with hx of anxiety, bipolar disorder, cognitive deficits, depression, DM, obesity, OCD, Impulse control disorder, presenting to the ED for depression and suicidal ideation. Patient reports lately she and her mother have been arguing very frequently and have not been getting along. States she's been depressed about this for about 3 days now. Date symptoms be getting worse and today she was feeling suicidal. States she did see her psychiatrist today, and ended up going home but she continued to feel worse so she came here for help. Patient states she does not want to live with her mother any longer. States she would like to go to [facility] group home. She reports she was there for a few weeks in the past and liked it much better.  States she has been having thoughts of cutting herself or overdosing on her medications. She denies any homicidal ideation. No hallucinations. Denies any significant alcohol or illicit drug use.  Past Medical History:  Diagnosis Date  . Anxiety   . Bipolar 1 disorder ([place])   . Cognitive deficits   . Depression   . Diabetes mellitus without complication ([place])   . Hypertension   . Mental disorder   . Obesity     Patient Active Problem List   Diagnosis Date Noted  . OCD (obsessive compulsive disorder) 10/05/2016  . Major depressive disorder, recurrent episode, mild ([employer]) 05/04/2016  . 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 Learning disability 07/18/2015  . 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 Diabetes mellitus ([place]) 07/18/2015  . MDD (major depressive disorder), recurrent, severe, with psychosis ([place]) 07/18/2015  . Hyperlipidemia 07/18/2015  . Severe episode of recurrent major  depressive disorder, without psychotic features ([place])   . Drug overdose   . Cognitive deficits 10/12/2012  . Generalized anxiety disorder 06/28/2012    Past Surgical History:  Procedure Laterality Date  . CESAREAN SECTION    . CESAREAN SECTION N/A 04/25/2013   Procedure: REPEAT CESAREAN SECTION;  Surgeon: [name], MD;  Location: [facility];  Service: Obstetrics;  Laterality: N/A;  . MASS EXCISION N/A 06/03/2012   Procedure: EXCISION MASS;  Surgeon: [name], MD;  Location: [facility];  Service: ENT;  Laterality: N/A;  Excision uvula mass  . TONSILLECTOMY N/A 06/03/2012   Procedure: TONSILLECTOMY;  Surgeon: [name], MD;  Location: [facility];  Service: ENT;  Laterality: N/A;  . TONSILLECTOMY      OB History    Gravida Para Term Preterm AB Living   3 3 3  0 0 3   SAB TAB Ectopic Multiple Live Births   0 0 0 0 3       Home Medications    Prior to Admission medications   Medication Sig Start Date End Date Taking? Authorizing Provider  busPIRone (BUSPAR) 15 MG tablet Take 1 tablet (15 mg total) by mouth 2 (two) times daily. For anxiety 10/06/16   Lindell Spar I, NP  DULoxetine (CYMBALTA) 60 MG capsule Take 1 capsule (60 mg total) by mouth daily. For depression 10/07/16   Lindell Spar I, NP  gabapentin (NEURONTIN) 400 MG capsule Take 1 capsule (400 mg total) by mouth 2 (two) times daily.  For agitation 10/06/16   Lindell Spar I, NP  glipiZIDE (GLUCOTROL XL) 10 MG 24 hr tablet Take 1 tablet (10 mg total) by mouth every evening. For diabetes management 10/06/16   Lindell Spar I, NP  hydrochlorothiazide (HYDRODIURIL) 25 MG tablet Take 1 tablet (25 mg total) by mouth every evening. For high blood pressure 10/06/16   Lindell Spar I, NP  hydrOXYzine (ATARAX/VISTARIL) 25 MG tablet Take 1 tablet (25 mg total) by mouth every 6 (six) hours as needed for anxiety. 10/06/16   Lindell Spar I, NP  metFORMIN (GLUCOPHAGE) 500 MG tablet Take 1 tablet (500 mg total) by  mouth 2 (two) times daily with a meal. For diabetes management 10/06/16   Lindell Spar I, NP  nicotine polacrilex (NICORETTE) 2 MG gum Take 1 each (2 mg total) by mouth as needed for smoking cessation. 10/06/16   Lindell Spar I, NP  omeprazole (PRILOSEC) 20 MG capsule Take 1 capsule (20 mg total) by mouth daily. For acid reflux 10/06/16   Lindell Spar I, NP  ondansetron (ZOFRAN) 4 MG tablet Take 1 tablet (4 mg total) by mouth every 8 (eight) hours as needed for nausea or vomiting. 10/06/16   Lindell Spar I, NP  solifenacin (VESICARE) 5 MG tablet Take 1 tablet (5 mg total) by mouth daily. For weak bladder 10/06/16   Lindell Spar I, NP  traZODone (DESYREL) 50 MG tablet Take 1 tablet (50 mg total) by mouth at bedtime as needed for sleep. 10/06/16   Encarnacion Slates, NP    Family History Family History  Problem Relation Age of Onset  . Hypertension Mother   . Diabetes Father     Social History Social History  Substance Use Topics  . Smoking status: Current Every Day Smoker    Packs/day: 1.00    Years: 7.00    Types: Cigarettes    Last attempt to quit: 06/30/2015  . Smokeless tobacco: Never Used  . Alcohol use No     Allergies   Wellbutrin [bupropion]; Omnipaque [iohexol]; and Penicillins   Review of Systems Review of Systems  Psychiatric/Behavioral: Positive for suicidal ideas.  All other systems reviewed and are negative.    Physical Exam Updated Vital Signs BP (!) 105/54 (BP Location: Left Arm) Comment: RN notified  Pulse 99   Temp 98.4 F (36.9 C) (Oral)   Resp 18   SpO2 97%   Physical Exam  Constitutional: She is oriented to person, place, and time. She appears well-developed and well-nourished.  HENT:  Head: Normocephalic and atraumatic.  Mouth/Throat: Oropharynx is clear and moist.  Eyes: Pupils are equal, round, and reactive to light. Conjunctivae and EOM are normal.  Neck: Normal range of motion.  Cardiovascular: Normal rate, regular rhythm and normal heart sounds.     Pulmonary/Chest: Effort normal and breath sounds normal. No respiratory distress. She has no wheezes.  Abdominal: Soft. Bowel sounds are normal. There is no tenderness. There is no rebound.  Musculoskeletal: Normal range of motion.  Neurological: She is alert and oriented to person, place, and time.  Skin: Skin is warm and dry.  Psychiatric: She exhibits a depressed mood.  Depressed mood, flat affect; reports SI with plan to cut herself or OD; denies HI/AVH  Nursing note and vitals reviewed.    ED Treatments / Results  Labs (all labs ordered are listed, but only abnormal results are displayed) Labs Reviewed  COMPREHENSIVE METABOLIC PANEL - Abnormal; Notable for the following:       Result Value  Glucose, Bld 152 (*)    AST 46 (*)    All other components within normal limits  ACETAMINOPHEN LEVEL - Abnormal; Notable for the following:    Acetaminophen (Tylenol), Serum <10 (*)    All other components within normal limits  CBC WITH DIFFERENTIAL/PLATELET  ETHANOL  SALICYLATE LEVEL  HCG, QUANTITATIVE, PREGNANCY  RAPID URINE DRUG SCREEN, HOSP PERFORMED  I-STAT BETA HCG BLOOD, ED ([facility], [facility], [facility] ONLY)    EKG  EKG Interpretation None       Radiology No results found.  Procedures Procedures (including critical care time)  Medications Ordered in ED Medications - No data to display   Initial Impression / Assessment and Plan / ED Course  I have reviewed the triage vital signs and the nursing notes.  Pertinent labs & imaging results that were available during my care of the patient were reviewed by me and considered in my medical decision making (see chart for details).  26 year old female here with depression and suicidal ideation. After talking with patient, it seems these issues are standing over her poor relationship and verbal altercations with her mother.  She denies any homicidal ideation. No hallucinations. States she has plan to cut herself or overdose on medications.  Screening lab work obtained and is overall reassuring. Patient medically cleared.  TTS has evaluated-- recommends AM psych assessment.  Holdings orders in place.  Home meds ordered.  Final Clinical Impressions(s) / ED Diagnoses   Final diagnoses:  Depressed affect  Suicidal thoughts    New Prescriptions New Prescriptions   No medications on file     Kathryne Hitch 11/05/16 2002    Charlesetta Shanks, MD 11/12/16 [phone]

## 2016-11-05 NOTE — ED Triage Notes (Signed)
Patient came to the ER for Depression and Sucidal Ideation.

## 2016-11-05 NOTE — BH Assessment (Signed)
Tele Assessment Note   [name] is an 26 y.o. female. Pt reports SI with a plan to overdose. Pt reports 4 previous SI attempts. Pt reports daily SI. Pt states she has been diagnosed with depression and receives medication management at [facility]. Per Pt she cannot recall the names of her medication. Pt states she is medication compliant. Pt reports multiple hospitalizations for depression and SI. Pt states her last hospitalization was in June 2018. The Pt states "I think I have an ACTT team." According to the Pt, she is depressed because of ongoing conflict with her mother. Pt denies SA. Pt denies abuse.   Theodoro Clock, DNP recommends am psych evaluation.  Diagnosis: F33.2 MDD  Past Medical History:  Past Medical History:  Diagnosis Date  . Anxiety   . Bipolar 1 disorder ([place])   . Cognitive deficits   . Depression   . Diabetes mellitus without complication ([place])   . Hypertension   . Mental disorder   . Obesity     Past Surgical History:  Procedure Laterality Date  . CESAREAN SECTION    . CESAREAN SECTION N/A 04/25/2013   Procedure: REPEAT CESAREAN SECTION;  Surgeon: [name], MD;  Location: [facility];  Service: Obstetrics;  Laterality: N/A;  . MASS EXCISION N/A 06/03/2012   Procedure: EXCISION MASS;  Surgeon: [name], MD;  Location: [place];  Service: ENT;  Laterality: N/A;  Excision uvula mass  . TONSILLECTOMY N/A 06/03/2012   Procedure: TONSILLECTOMY;  Surgeon: [name], MD;  Location: [place];  Service: ENT;  Laterality: N/A;  . TONSILLECTOMY      Family History:  Family History  Problem Relation Age of Onset  . Hypertension Mother   . Diabetes Father     Social History:  reports that she has been smoking Cigarettes.  She has a 7.00 pack-year smoking history. She has never used smokeless tobacco. She reports that she does not drink alcohol or use drugs.  Additional Social History:  Alcohol / Drug Use Pain Medications:  please see mar  CIWA: CIWA-Ar BP: (!) 105/54 (RN notified) Pulse Rate: 99 COWS:    PATIENT STRENGTHS: (choose at least two) Capable of independent living Communication skills  Allergies:  Allergies  Allergen Reactions  . Wellbutrin [Bupropion] Shortness Of Breath  . Omnipaque [Iohexol] Swelling and Other (See Comments)    Reaction:  Eye swelling  . Penicillins Hives and Other (See Comments)    Has patient had a PCN reaction causing immediate rash, facial/tongue/throat swelling, SOB or lightheadedness with hypotension: No Has patient had a PCN reaction causing severe rash involving mucus membranes or skin necrosis: No Has patient had a PCN reaction that required hospitalization No Has patient had a PCN reaction occurring within the last 10 years: No If all of the above answers are "NO", then may proceed with Cephalosporin use.    Home Medications:  (Not in a hospital admission)  OB/GYN Status:  No LMP recorded. Patient has had an implant.  General Assessment Data [facility]: [facility] TTS Assessment: In system Is this a Tele or Face-to-Face Assessment?: Face-to-Face Is this an Initial Assessment or a Re-assessment for this encounter?: Initial Assessment Marital status: Single Maiden name: NA Is patient pregnant?: No Pregnancy Status: No Living Arrangements: Alone Can pt return to current living arrangement?: Yes Admission Status: Voluntary Is patient capable of signing voluntary admission?: Yes Referral Source: Self/Family/Friend Insurance type: Medicaid     Crisis Care Plan Living Arrangements:  Alone Legal Guardian: Other: (self) Name of Psychiatrist: Dr. Bernita Raisin Name of Therapist: [facility]  Education Status Is patient currently in school?: No Current Grade: N A Highest grade of school patient has completed: 64 Name of school: NA Contact person: NA  Risk to self with the past 6 months Suicidal Ideation: Yes-Currently  Present Has patient been a risk to self within the past 6 months prior to admission? : Yes Suicidal Intent: Yes-Currently Present Is patient at risk for suicide?: Yes Previous Attempts/Gestures: Yes How many times?: 4 Substance abuse history and/or treatment for substance abuse?: No  Risk to Others within the past 6 months Homicidal Ideation: No Does patient have any lifetime risk of violence toward others beyond the six months prior to admission? : No Thoughts of Harm to Others: No Current Homicidal Intent: No Current Homicidal Plan: No Access to Homicidal Means: No Identified Victim: NA History of harm to others?: No Assessment of Violence: None Noted Violent Behavior Description: NA Does patient have access to weapons?: No Criminal Charges Pending?: No Does patient have a court date: No Is patient on probation?: No  Psychosis Hallucinations: None noted Delusions: None noted  Mental Status Report Appearance/Hygiene: In scrubs Eye Contact: Good Motor Activity: Freedom of movement Speech: Logical/coherent Level of Consciousness: Alert Mood: Depressed Affect: Depressed Anxiety Level: Minimal Thought Processes: Coherent, Relevant Judgement: Unimpaired Orientation: Person, Place, Time, Situation, Appropriate for developmental age Obsessive Compulsive Thoughts/Behaviors: None  Cognitive Functioning Concentration: Normal Memory: Recent Intact, Remote Intact IQ: Average Insight: Fair Impulse Control: Poor Appetite: Fair Weight Loss: 0 Weight Gain: 0 Sleep: Decreased Total Hours of Sleep: 5  ADLScreening [facility] Assessment Services) Patient's cognitive ability adequate to safely complete daily activities?: No Patient able to express need for assistance with ADLs?: Yes Independently performs ADLs?: Yes (appropriate for developmental age)  Prior Inpatient Therapy Prior Inpatient Therapy: Yes Prior Therapy Dates: 01/2016, multiple admits Prior Therapy  Facilty/Provider(s): [facility], other facilities Reason for Treatment: Depression  Prior Outpatient Therapy Prior Outpatient Therapy: Yes Prior Therapy Dates: Current Prior Therapy Facilty/Provider(s): [facility] Reason for Treatment: Depression Does patient have an ACCT team?: No Does patient have Intensive In-House Services?  : No Does patient have [facility] services? : No Does patient have P4CC services?: No  ADL Screening (condition at time of admission) Patient's cognitive ability adequate to safely complete daily activities?: No Is the patient deaf or have difficulty hearing?: No Does the patient have difficulty seeing, even when wearing glasses/contacts?: No Does the patient have difficulty concentrating, remembering, or making decisions?: No Patient able to express need for assistance with ADLs?: Yes Does the patient have difficulty dressing or bathing?: No Independently performs ADLs?: Yes (appropriate for developmental age) Does the patient have difficulty walking or climbing stairs?: No Weakness of Legs: None Weakness of Arms/Hands: None       Abuse/Neglect Assessment (Assessment to be complete while patient is alone) Physical Abuse: Denies Verbal Abuse: Denies Sexual Abuse: Denies Exploitation of patient/patient's resources: Denies Self-Neglect: Denies     Regulatory affairs officer (For Healthcare) Does Patient Have a Medical Advance Directive?: No    Additional Information 1:1 In Past 12 Months?: No CIRT Risk: No Elopement Risk: No Does patient have medical clearance?: Yes     Disposition:     Kemyra August D 11/05/2016 4:43 PM

## 2016-11-05 NOTE — ED Notes (Signed)
Patient pleasant with staff on assessment. Patient with passive SI, but no specific plan at this time. Pt verbally contracts for safety. Pt given a sandwich and sodas on request. No distress noted.

## 2016-11-05 NOTE — ED Notes (Signed)
Pt oriented to room and unit.  Pt states she is suicidal but contracts for safety.  Pt denies H/I and AVH.  Pt is calm and cooperative.  15 minute checks and video monitoring in place.

## 2016-11-06 DIAGNOSIS — F17210 Nicotine dependence, cigarettes, uncomplicated: Secondary | ICD-10-CM

## 2016-11-06 DIAGNOSIS — F330 Major depressive disorder, recurrent, mild: Secondary | ICD-10-CM

## 2016-11-06 LAB — CBG MONITORING, ED: GLUCOSE-CAPILLARY: 161 mg/dL — AB (ref 65–99)

## 2016-11-06 NOTE — Discharge Instructions (Signed)
For your ongoing behavioral health needs, you are advised to continue treatment with [facility]:       [facility]      [address], [place], [place] [address]      [phone]

## 2016-11-06 NOTE — ED Notes (Signed)
When asked about self harm thoughts or si thoughts, pt responded by shaking her head yes and saying thoughts to cut her hair and problems with her mother. She verbally denied hi and hallucinations. Pt says that she has diabetes. CBG checked and metformin given. Safety maintained on the unit.

## 2016-11-06 NOTE — BH Assessment (Signed)
[place] Assessment Progress Note  Per [name], MD, this pt does not require psychiatric hospitalization at this time.  Pt is to be discharged from [facility] with recommendation to continue treatment with [facility].  This has been included in pt's discharge instructions.  Pt's nurse, Jan, has been notified.  Jalene Mullet, [place] Triage Specialist [phone]

## 2016-11-06 NOTE — Consult Note (Signed)
[facility] Psychiat[facility] Consult   Reason for Consult:  Altercation with her mother Referring Physician:  EDP Patient Identification: [name] MRN:  [id number] Principal Diagnosis: Major depressive disorder, recurrent episode, mild ([place]) Diagnosis:   Patient Active Problem List   Diagnosis Date Noted  . Major depressive disorder, recurrent episode, mild ([place]) [F33.0] 05/04/2016    Priority: High  . Generalized anxiety disorder [F41.1] 06/28/2012    Priority: High  . OCD (obsessive compulsive disorder) [F42.9] 10/05/2016  . 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 Learning disability [F81.9] 07/18/2015  . 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 Diabetes mellitus ([place]) [E11.9] 07/18/2015  . MDD (major depressive disorder), recurrent, severe, with psychosis ([place]) [F33.3] 07/18/2015  . Hyperlipidemia [E78.5] 07/18/2015  . Severe episode of recurrent major depressive disorder, without psychotic features ([place]) [F33.2]   . Drug overdose [T50.901A]   . Cognitive deficits [R41.89] 10/12/2012    Total Time spent with patient: 45 minutes  Subjective:   [name] is a 26 y.o. female patient does not warrant admission.  HPI:  26 yo female who presented to the ED with depression and suicidal ideations after an altercation with her mother who she lives with.  She has an ACT team who will be notified as she is no longer suicidal or homicidal, hallucinations, or substance abuse.  STable for discharge, encouraged her to have her ACT team help with her relationship issues with her mother.  Past Psychiatric History: depression  Risk to Self: None Risk to Others: Homicidal Ideation: No Thoughts of Harm to Others: No Current Homicidal Intent: No Current Homicidal Plan: No Access to Homicidal Means: No Identified Victim: NA History of harm to others?: No Assessment of Violence: None Noted Violent Behavior Description: NA Does patient have access to  weapons?: No Criminal Charges Pending?: No Does patient have a court date: No Prior Inpatient Therapy: Prior Inpatient Therapy: Yes Prior Therapy Dates: 01/2016, multiple admits Prior Therapy Facilty/Provider(s): [facility], other facilities Reason for Treatment: Depression Prior Outpatient Therapy: Prior Outpatient Therapy: Yes Prior Therapy Dates: Current Prior Therapy Facilty/Provider(s): [facility] Reason for Treatment: Depression Does patient have an ACCT team?: No Does patient have Intensive In-House Services?  : No Does patient have [facility] services? : No Does patient have P4CC services?: No  Past Medical History:  Past Medical History:  Diagnosis Date  . Anxiety   . Bipolar 1 disorder ([place])   . Cognitive deficits   . Depression   . Diabetes mellitus without complication ([place])   . Hypertension   . Mental disorder   . Obesity     Past Surgical History:  Procedure Laterality Date  . CESAREAN SECTION    . CESAREAN SECTION N/A 04/25/2013   Procedure: REPEAT CESAREAN SECTION;  Surgeon: [name], MD;  Location: [facility];  Service: Obstetrics;  Laterality: N/A;  . MASS EXCISION N/A 06/03/2012   Procedure: EXCISION MASS;  Surgeon: [name], MD;  Location: [place];  Service: ENT;  Laterality: N/A;  Excision uvula mass  . TONSILLECTOMY N/A 06/03/2012   Procedure: TONSILLECTOMY;  Surgeon: [name], MD;  Location: [place];  Service: ENT;  Laterality: N/A;  . TONSILLECTOMY     Family History:  Family History  Problem Relation Age of Onset  . Hypertension Mother   . Diabetes Father    Family Psychiatric  History: none Social History:  History  Alcohol Use No     History  Drug Use No  Comment: Patient denies    Social History   Social History  . Marital status: Single    Spouse name: N/A  . Number of children: N/A  . Years of education: N/A   Social History Main Topics  . Smoking status:  Current Every Day Smoker    Packs/day: 1.00    Years: 7.00    Types: Cigarettes    Last attempt to quit: 06/30/2015  . Smokeless tobacco: Never Used  . Alcohol use No  . Drug use: No     Comment: Patient denies  . Sexual activity: Not Currently   Other Topics Concern  . None   Social History Narrative  . None   Additional Social History:    Allergies:   Allergies  Allergen Reactions  . Wellbutrin [Bupropion] Shortness Of Breath  . Omnipaque [Iohexol] Swelling and Other (See Comments)    Reaction:  Eye swelling  . Penicillins Hives and Other (See Comments)    Has patient had a PCN reaction causing immediate rash, facial/tongue/throat swelling, SOB or lightheadedness with hypotension: No Has patient had a PCN reaction causing severe rash involving mucus membranes or skin necrosis: No Has patient had a PCN reaction that required hospitalization No Has patient had a PCN reaction occurring within the last 10 years: No If all of the above answers are "NO", then may proceed with Cephalosporin use.    Labs:  Results for orders placed or performed during the hospital encounter of 11/05/16 (from the past 48 hour(s))  CBC with Differential     Status: None   Collection Time: 11/05/16  2:47 PM  Result Value Ref Range   WBC 8.6 4.0 - 10.5 K/uL   RBC 4.82 3.87 - 5.11 MIL/uL   Hemoglobin 12.8 12.0 - 15.0 g/dL   HCT 38.0 36.0 - 46.0 %   MCV 78.8 78.0 - 100.0 fL   MCH 26.6 26.0 - 34.0 pg   MCHC 33.7 30.0 - 36.0 g/dL   RDW 14.4 11.5 - 15.5 %   Platelets 212 150 - 400 K/uL   Neutrophils Relative % 54 %   Neutro Abs 4.7 1.7 - 7.7 K/uL   Lymphocytes Relative 36 %   Lymphs Abs 3.1 0.7 - 4.0 K/uL   Monocytes Relative 6 %   Monocytes Absolute 0.5 0.1 - 1.0 K/uL   Eosinophils Relative 3 %   Eosinophils Absolute 0.3 0.0 - 0.7 K/uL   Basophils Relative 1 %   Basophils Absolute 0.0 0.0 - 0.1 K/uL  Comprehensive metabolic panel     Status: Abnormal   Collection Time: 11/05/16  2:47 PM   Result Value Ref Range   Sodium 141 135 - 145 mmol/L   Potassium 3.5 3.5 - 5.1 mmol/L   Chloride 104 101 - 111 mmol/L   CO2 25 22 - 32 mmol/L   Glucose, Bld 152 (H) 65 - 99 mg/dL   BUN 12 6 - 20 mg/dL   Creatinine, Ser 0.87 0.44 - 1.00 mg/dL   Calcium 9.6 8.9 - 10.3 mg/dL   Total Protein 7.9 6.5 - 8.1 g/dL   Albumin 4.5 3.5 - 5.0 g/dL   AST 46 (H) 15 - 41 U/L   ALT 42 14 - 54 U/L   Alkaline Phosphatase 48 38 - 126 U/L   Total Bilirubin 0.6 0.3 - 1.2 mg/dL   GFR calc non Af Amer >60 >60 mL/min   GFR calc Af Amer >60 >60 mL/min    Comment: (NOTE) The eGFR has  been calculated using the CKD EPI equation. This calculation has not been validated in all clinical situations. eGFR's persistently <60 mL/min signify possible Chronic Kidney Disease.    Anion gap 12 5 - 15  Ethanol     Status: None   Collection Time: 11/05/16  2:47 PM  Result Value Ref Range   Alcohol, Ethyl (B) <5 <5 mg/dL    Comment:        LOWEST DETECTABLE LIMIT FOR SERUM ALCOHOL IS 5 mg/dL FOR MEDICAL PURPOSES ONLY   Salicylate level     Status: None   Collection Time: 11/05/16  2:47 PM  Result Value Ref Range   Salicylate Lvl <9.6 2.8 - 30.0 mg/dL  Acetaminophen level     Status: Abnormal   Collection Time: 11/05/16  2:47 PM  Result Value Ref Range   Acetaminophen (Tylenol), Serum <10 (L) 10 - 30 ug/mL    Comment:        THERAPEUTIC CONCENTRATIONS VARY SIGNIFICANTLY. A RANGE OF 10-30 ug/mL MAY BE AN EFFECTIVE CONCENTRATION FOR MANY PATIENTS. HOWEVER, SOME ARE BEST TREATED AT CONCENTRATIONS OUTSIDE THIS RANGE. ACETAMINOPHEN CONCENTRATIONS >150 ug/mL AT 4 HOURS AFTER INGESTION AND >50 ug/mL AT 12 HOURS AFTER INGESTION ARE OFTEN ASSOCIATED WITH TOXIC REACTIONS.   hCG, quantitative, pregnancy     Status: None   Collection Time: 11/05/16  2:47 PM  Result Value Ref Range   hCG, Beta Chain, Quant, S <1 <5 mIU/mL    Comment:          GEST. AGE      CONC.  (mIU/mL)   <=1 WEEK        5 - 50     2 WEEKS        50 - 500     3 WEEKS       100 - 10,000     4 WEEKS     1,000 - 30,000     5 WEEKS     3,500 - 115,000   6-8 WEEKS     12,000 - 270,000    12 WEEKS     15,000 - 220,000        FEMALE AND NON-PREGNANT FEMALE:     LESS THAN 5 mIU/mL   I-Stat Beta hCG blood, ED ([facility], [facility], [facility] only)     Status: None   Collection Time: 11/05/16  2:58 PM  Result Value Ref Range   I-stat hCG, quantitative <5.0 <5 mIU/mL   Comment 3            Comment:   GEST. AGE      CONC.  (mIU/mL)   <=1 WEEK        5 - 50     2 WEEKS       50 - 500     3 WEEKS       100 - 10,000     4 WEEKS     1,000 - 30,000        FEMALE AND NON-PREGNANT FEMALE:     LESS THAN 5 mIU/mL   CBG monitoring, ED     Status: Abnormal   Collection Time: 11/06/16  7:32 AM  Result Value Ref Range   Glucose-Capillary 161 (H) 65 - 99 mg/dL    Current Facility-Administered Medications  Medication Dose Route Frequency Provider Last Rate Last Dose  . alum & mag hydroxide-simeth (MAALOX/MYLANTA) 200-200-20 MG/5ML suspension 30 mL  30 mL Oral Q6H PRN Larene Pickett, PA-C      .  busPIRone (BUSPAR) tablet 15 mg  15 mg Oral BID Larene Pickett, PA-C   15 mg at 11/06/16 5643  . darifenacin (ENABLEX) 24 hr tablet 7.5 mg  7.5 mg Oral Daily Larene Pickett, PA-C   7.5 mg at 11/06/16 3295  . DULoxetine (CYMBALTA) DR capsule 60 mg  60 mg Oral Daily Larene Pickett, PA-C   60 mg at 11/06/16 1884  . gabapentin (NEURONTIN) capsule 400 mg  400 mg Oral BID Larene Pickett, PA-C   400 mg at 11/06/16 1660  . glipiZIDE (GLUCOTROL XL) 24 hr tablet 10 mg  10 mg Oral QPM Larene Pickett, PA-C   10 mg at 11/05/16 1819  . hydrochlorothiazide (HYDRODIURIL) tablet 25 mg  25 mg Oral QPM Larene Pickett, PA-C      . hydrOXYzine (ATARAX/VISTARIL) tablet 25 mg  25 mg Oral Q6H PRN Larene Pickett, PA-C      . ibuprofen (ADVIL,MOTRIN) tablet 600 mg  600 mg Oral Q8H PRN Larene Pickett, PA-C      . metFORMIN (GLUCOPHAGE) tablet 500 mg  500 mg Oral BID WC Larene Pickett, PA-C    500 mg at 11/06/16 0732  . nicotine (NICODERM CQ - dosed in mg/24 hours) patch 21 mg  21 mg Transdermal Daily Quincy Carnes M, PA-C      . ondansetron [facility]) tablet 4 mg  4 mg Oral Q8H PRN Larene Pickett, PA-C      . [START ON 11/20/2016] paliperidone (INVEGA SUSTENNA) injection 234 mg  234 mg Intramuscular Q30 days Larene Pickett, PA-C      . pantoprazole (PROTONIX) EC tablet 40 mg  40 mg Oral Daily Quincy Carnes M, PA-C   40 mg at 11/06/16 6301  . zolpidem (AMBIEN) tablet 5 mg  5 mg Oral QHS PRN Larene Pickett, PA-C       Current Outpatient Prescriptions  Medication Sig Dispense Refill  . busPIRone (BUSPAR) 15 MG tablet Take 1 tablet (15 mg total) by mouth 2 (two) times daily. For anxiety 60 tablet 0  . DULoxetine (CYMBALTA) 60 MG capsule Take 1 capsule (60 mg total) by mouth daily. For depression 30 capsule 0  . gabapentin (NEURONTIN) 400 MG capsule Take 1 capsule (400 mg total) by mouth 2 (two) times daily. For agitation 60 capsule 0  . glipiZIDE (GLUCOTROL XL) 10 MG 24 hr tablet Take 1 tablet (10 mg total) by mouth every evening. For diabetes management 30 tablet 0  . hydrochlorothiazide (HYDRODIURIL) 25 MG tablet Take 1 tablet (25 mg total) by mouth every evening. For high blood pressure 30 tablet 0  . metFORMIN (GLUCOPHAGE) 500 MG tablet Take 1 tablet (500 mg total) by mouth 2 (two) times daily with a meal. For diabetes management 60 tablet 0  . nicotine polacrilex (NICORETTE) 2 MG gum Take 1 each (2 mg total) by mouth as needed for smoking cessation. 100 tablet 0  . ondansetron (ZOFRAN) 4 MG tablet Take 1 tablet (4 mg total) by mouth every 8 (eight) hours as needed for nausea or vomiting. 12 tablet 0  . paliperidone (INVEGA SUSTENNA) 234 MG/1.5ML SUSP injection Inject 234 mg into the muscle every 30 (thirty) days.    Marland Kitchen solifenacin (VESICARE) 5 MG tablet Take 1 tablet (5 mg total) by mouth daily. For weak bladder (Patient taking differently: Take 5 mg by mouth daily as needed. For weak  bladder)    . hydrOXYzine (ATARAX/VISTARIL) 25 MG tablet Take 1 tablet (25  mg total) by mouth every 6 (six) hours as needed for anxiety. 60 tablet 0  . omeprazole (PRILOSEC) 20 MG capsule Take 1 capsule (20 mg total) by mouth daily. For acid reflux (Patient taking differently: Take 20 mg by mouth daily as needed. For acid reflux) 10 capsule 0  . traZODone (DESYREL) 50 MG tablet Take 1 tablet (50 mg total) by mouth at bedtime as needed for sleep. (Patient not taking: Reported on 11/05/2016) 30 tablet 0    Musculoskeletal: Strength & Muscle Tone: within normal limits Gait & Station: normal Patient leans: N/A  Psychiatric Specialty Exam: Physical Exam  Constitutional: She is oriented to person, place, and time. She appears well-developed and well-nourished.  HENT:  Head: Normocephalic.  Neck: Normal range of motion.  Respiratory: Effort normal.  Musculoskeletal: Normal range of motion.  Neurological: She is alert and oriented to person, place, and time.  Psychiatric: Her speech is normal and behavior is normal. Judgment and thought content normal. Cognition and memory are normal. She exhibits a depressed mood.    Review of Systems  Psychiatric/Behavioral: Positive for depression.  All other systems reviewed and are negative.   Blood pressure (!) 156/105, pulse 88, temperature 98.5 F (36.9 C), temperature source Oral, resp. rate 18, SpO2 99 %.There is no height or weight on file to calculate BMI.  General Appearance: Casual  Eye Contact:  Good  Speech:  Normal Rate  Volume:  Normal  Mood:  Depressed, mild  Affect:  Congruent  Thought Process:  Coherent and Descriptions of Associations: Intact  Orientation:  Full (Time, Place, and Person)  Thought Content:  WDL and Logical  Suicidal Thoughts:  No  Homicidal Thoughts:  No  Memory:  Immediate;   Good Recent;   Good Remote;   Good  Judgement:  Fair  Insight:  Fair  Psychomotor Activity:  Normal  Concentration:  Concentration:  Good and Attention Span: Good  Recall:  Good  Fund of Knowledge:  Fair  Language:  Good  Akathisia:  No  Handed:  Right  AIMS (if indicated):     Assets:  Housing Leisure Time Physical Health Resilience Social Support  ADL's:  Intact  Cognition:  WNL  Sleep:        Treatment Plan Summary: Daily contact with patient to assess and evaluate symptoms and progress in treatment, Medication management and Plan major depressive disorder, recurrent, mild:  -Crisis stabilization -Medication management:  Continued her medical medications along with Buspar 15 mg BID for anxiety, Cymbalta 60 mg daily for depression -Individual counseling -ACT team coordination of care  Disposition: No evidence of imminent risk to self or others at present.    Waylan Boga, NP 11/06/2016 11:32 AM  Patient seen face-to-face for psychiatric evaluation, chart reviewed and case discussed with the physician extender and developed treatment plan. Reviewed the information documented and agree with the treatment plan. [name], MD

## 2016-11-06 NOTE — [facility] Suicide Risk Assessment (Signed)
Suicide Risk Assessment  Discharge Assessment   [facility] Discharge Suicide Risk Assessment   Principal Problem: Major depressive disorder, recurrent episode, mild ([place]) Discharge Diagnoses:  Patient Active Problem List   Diagnosis Date Noted  . Major depressive disorder, recurrent episode, mild ([place]) [F33.0] 05/04/2016    Priority: High  . Generalized anxiety disorder [F41.1] 06/28/2012    Priority: High  . OCD (obsessive compulsive disorder) [F42.9] 10/05/2016  . 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 Learning disability [F81.9] 07/18/2015  . 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 Diabetes mellitus ([place]) [E11.9] 07/18/2015  . MDD (major depressive disorder), recurrent, severe, with psychosis ([place]) [F33.3] 07/18/2015  . Hyperlipidemia [E78.5] 07/18/2015  . Severe episode of recurrent major depressive disorder, without psychotic features ([place]) [F33.2]   . Drug overdose [T50.901A]   . Cognitive deficits [R41.89] 10/12/2012    Total Time spent with patient: 45 minutes  Musculoskeletal: Strength & Muscle Tone: within normal limits Gait & Station: normal Patient leans: N/A  Psychiatric Specialty Exam: Physical Exam  Constitutional: She is oriented to person, place, and time. She appears well-developed and well-nourished.  HENT:  Head: Normocephalic.  Neck: Normal range of motion.  Respiratory: Effort normal.  Musculoskeletal: Normal range of motion.  Neurological: She is alert and oriented to person, place, and time.  Psychiatric: Her speech is normal and behavior is normal. Judgment and thought content normal. Cognition and memory are normal. She exhibits a depressed mood.    Review of Systems  Psychiatric/Behavioral: Positive for depression.  All other systems reviewed and are negative.   Blood pressure (!) 156/105, pulse 88, temperature 98.5 F (36.9 C), temperature source Oral, resp. rate 18, SpO2 99 %.There is no height or weight on file to  calculate BMI.  General Appearance: Casual  Eye Contact:  Good  Speech:  Normal Rate  Volume:  Normal  Mood:  Depressed, mild  Affect:  Congruent  Thought Process:  Coherent and Descriptions of Associations: Intact  Orientation:  Full (Time, Place, and Person)  Thought Content:  WDL and Logical  Suicidal Thoughts:  No  Homicidal Thoughts:  No  Memory:  Immediate;   Good Recent;   Good Remote;   Good  Judgement:  Fair  Insight:  Fair  Psychomotor Activity:  Normal  Concentration:  Concentration: Good and Attention Span: Good  Recall:  Good  Fund of Knowledge:  Fair  Language:  Good  Akathisia:  No  Handed:  Right  AIMS (if indicated):     Assets:  Housing Leisure Time Physical Health Resilience Social Support  ADL's:  Intact  Cognition:  WNL  Sleep:      Mental Status Per Nursing Assessment::   On Admission:   altercation with her mother  Demographic Factors:  Adolescent or young adult  Loss Factors: NA  Historical Factors: NA  Risk Reduction Factors:   Sense of responsibility to family, Living with another person, especially a relative, Positive social support and Positive therapeutic relationship  Continued Clinical Symptoms:  Depression, mild  Cognitive Features That Contribute To Risk:  None    Suicide Risk:  Minimal: No identifiable suicidal ideation.  Patients presenting with no risk factors but with morbid ruminations; may be classified as minimal risk based on the severity of the depressive symptoms    Plan Of Care/Follow-up recommendations:  Activity:  as tolerated  Diet:  heart healthy diet  Zayden Maffei, NP 11/06/2016, 11:35 AM

## 2016-11-06 NOTE — ED Notes (Signed)
Pt d/c home to f/u with ACT team. All items returned.

## 2016-11-16 ENCOUNTER — Emergency Department (HOSPITAL_COMMUNITY)
Admission: EM | Admit: 2016-11-16 | Discharge: 2016-11-16 | Discharge status: Against Medical Advice | Payer: Medicaid Other

## 2016-11-16 NOTE — ED Notes (Signed)
Pt called for triage, not in lobby.

## 2016-11-16 NOTE — ED Triage Notes (Signed)
Per EMS-generalized abdominal pain-chronic-patient has care plan

## 2016-12-02 ENCOUNTER — Emergency Department (HOSPITAL_COMMUNITY)
Admission: EM | Admit: 2016-12-02 | Discharge: 2016-12-02 | Discharge status: Against Medical Advice | Payer: Medicaid Other | Attending: Emergency Medicine | Admitting: Emergency Medicine

## 2016-12-02 ENCOUNTER — Encounter (HOSPITAL_COMMUNITY): Payer: Self-pay | Admitting: Emergency Medicine

## 2016-12-02 DIAGNOSIS — Z5321 Procedure and treatment not carried out due to patient leaving prior to being seen by health care provider: Secondary | ICD-10-CM | POA: Diagnosis not present

## 2016-12-02 DIAGNOSIS — R1084 Generalized abdominal pain: Secondary | ICD-10-CM | POA: Diagnosis present

## 2016-12-02 NOTE — ED Triage Notes (Signed)
Patient c/o urinary frequency and abd cramping that is intermittent x couple weeks.

## 2016-12-02 NOTE — ED Notes (Addendum)
Patient called to go to room x 3 and no answer

## 2016-12-16 ENCOUNTER — Encounter (HOSPITAL_COMMUNITY): Payer: Self-pay

## 2016-12-16 ENCOUNTER — Emergency Department (HOSPITAL_COMMUNITY)
Admission: EM | Admit: 2016-12-16 | Discharge: 2016-12-16 | Disposition: A | Discharge status: Home / Self Care | Payer: Medicaid Other | Attending: Emergency Medicine | Admitting: Emergency Medicine

## 2016-12-16 DIAGNOSIS — E119 Type 2 diabetes mellitus without complications: Secondary | ICD-10-CM | POA: Diagnosis not present

## 2016-12-16 DIAGNOSIS — M79672 Pain in left foot: Secondary | ICD-10-CM | POA: Diagnosis not present

## 2016-12-16 DIAGNOSIS — F17210 Nicotine dependence, cigarettes, uncomplicated: Secondary | ICD-10-CM | POA: Insufficient documentation

## 2016-12-16 DIAGNOSIS — Z7984 Long term (current) use of oral hypoglycemic drugs: Secondary | ICD-10-CM | POA: Insufficient documentation

## 2016-12-16 DIAGNOSIS — M79671 Pain in right foot: Secondary | ICD-10-CM | POA: Diagnosis not present

## 2016-12-16 DIAGNOSIS — R350 Frequency of micturition: Secondary | ICD-10-CM | POA: Diagnosis not present

## 2016-12-16 LAB — CBG MONITORING, ED: GLUCOSE-CAPILLARY: 134 mg/dL — AB (ref 65–99)

## 2016-12-16 LAB — URINALYSIS, ROUTINE W REFLEX MICROSCOPIC
Bilirubin Urine: NEGATIVE
GLUCOSE, UA: NEGATIVE mg/dL
Hgb urine dipstick: NEGATIVE
KETONES UR: 5 mg/dL — AB
Leukocytes, UA: NEGATIVE
Nitrite: NEGATIVE
PH: 5 (ref 5.0–8.0)
Protein, ur: 30 mg/dL — AB
SPECIFIC GRAVITY, URINE: 1.028 (ref 1.005–1.030)

## 2016-12-16 LAB — PREGNANCY, URINE: Preg Test, Ur: NEGATIVE

## 2016-12-16 NOTE — Care Management Note (Signed)
Case Management Note  CM consulted for 18 ED visits in the last 6 months, noted this is an ongoing issue.  Spoke with pt who states she sees Dr [name] at the [facility] location.  Pt reports she comes to the ED frequently because she has concerns for being able to pay the co-pay at the PCP office. CM asked if it was ok that I contact PCP office to discuss and was given to the ok to call.  CM spoke with receptionist who made a note for PCP of 18 ED visits and concern for payment. Was advised that pt currently owes $9.00 but they do not make pt's start paying until the amount reaches $20.00.  CM also discussed with the pt that the clinic has walk-in appointments as well, so that is an option at either locaiton.  Pt denied any difficulty getting medications. Pt understood plan of care going forward.  [place], PA was in the room at time of discussion and is in agreement.  No further CM needs noted at this time.

## 2016-12-16 NOTE — ED Notes (Signed)
Bed: WA17 Expected date:  Expected time:  Means of arrival:  Comments: 26 yo F polyuria

## 2016-12-16 NOTE — Discharge Instructions (Signed)
Your urinalysis today does not show any significant abnormality. Please take the medication you're given a primary care and continue to follow with your primary care physician.

## 2016-12-16 NOTE — ED Provider Notes (Signed)
[facility] DEPT Provider Note   CSN: [id number] Arrival date & time: 12/16/16  1426     History   Chief Complaint Chief Complaint  Patient presents with  . Polyuria  . Foot Pain    Right    HPI   Blood pressure (!) 133/92, temperature 98.7 F (37.1 C), temperature source Oral, resp. rate 18, height 5\' 7"  (1.702 m), weight 120.7 kg (266 lb), SpO2 98 %.  [name] is a 26 y.o. female complaining of Urinary frequency onset 2 weeks ago she states that she is urinating approximately every 5 minutes she has no associated dysuria, foul-smelling urine, fever, flank pain. She was given a prescription for vessel care by primary care approximately one month ago but it hasn't improved her symptoms. She also reports bilateral foot pain she states that this pain is worse after she takes a long walk. There is no associated swelling or trauma to the area. She feels it may be related to diabetic neuropathy. Her blood sugars have been running in the low 100s over the last several weeks.  Past Medical History:  Diagnosis Date  . Anxiety   . Bipolar 1 disorder ([place])   . Cognitive deficits   . Depression   . Diabetes mellitus without complication ([place])   . Hypertension   . Mental disorder   . Obesity     Patient Active Problem List   Diagnosis Date Noted  . OCD (obsessive compulsive disorder) 10/05/2016  . Major depressive disorder, recurrent episode, mild ([place]) 05/04/2016  . 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 Learning disability 07/18/2015  . 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 Diabetes mellitus ([place]) 07/18/2015  . MDD (major depressive disorder), recurrent, severe, with psychosis ([place]) 07/18/2015  . Hyperlipidemia 07/18/2015  . Severe episode of recurrent major depressive disorder, without psychotic features ([place])   . Drug overdose   . Cognitive deficits 10/12/2012  . Generalized anxiety disorder 06/28/2012    Past Surgical History:  Procedure Laterality  Date  . CESAREAN SECTION    . CESAREAN SECTION N/A 04/25/2013   Procedure: REPEAT CESAREAN SECTION;  Surgeon: [name], MD;  Location: [facility];  Service: Obstetrics;  Laterality: N/A;  . MASS EXCISION N/A 06/03/2012   Procedure: EXCISION MASS;  Surgeon: [name], MD;  Location: [facility];  Service: ENT;  Laterality: N/A;  Excision uvula mass  . TONSILLECTOMY N/A 06/03/2012   Procedure: TONSILLECTOMY;  Surgeon: [name], MD;  Location: [facility];  Service: ENT;  Laterality: N/A;  . TONSILLECTOMY      OB History    Gravida Para Term Preterm AB Living   3 3 3  0 0 3   SAB TAB Ectopic Multiple Live Births   0 0 0 0 3       Home Medications    Prior to Admission medications   Medication Sig Start Date End Date Taking? Authorizing Provider  busPIRone (BUSPAR) 15 MG tablet Take 1 tablet (15 mg total) by mouth 2 (two) times daily. For anxiety 10/06/16   Lindell Spar I, NP  DULoxetine (CYMBALTA) 60 MG capsule Take 1 capsule (60 mg total) by mouth daily. For depression 10/07/16   Lindell Spar I, NP  gabapentin (NEURONTIN) 400 MG capsule Take 1 capsule (400 mg total) by mouth 2 (two) times daily. For agitation 10/06/16   Lindell Spar I, NP  glipiZIDE (GLUCOTROL XL) 10 MG 24 hr tablet Take 1 tablet (10 mg total) by mouth every evening. For  diabetes management 10/06/16   Lindell Spar I, NP  hydrochlorothiazide (HYDRODIURIL) 25 MG tablet Take 1 tablet (25 mg total) by mouth every evening. For high blood pressure 10/06/16   Lindell Spar I, NP  hydrOXYzine (ATARAX/VISTARIL) 25 MG tablet Take 1 tablet (25 mg total) by mouth every 6 (six) hours as needed for anxiety. 10/06/16   Lindell Spar I, NP  metFORMIN (GLUCOPHAGE) 500 MG tablet Take 1 tablet (500 mg total) by mouth 2 (two) times daily with a meal. For diabetes management 10/06/16   Lindell Spar I, NP  nicotine polacrilex (NICORETTE) 2 MG gum Take 1 each (2 mg total) by mouth as needed for smoking cessation.  10/06/16   Lindell Spar I, NP  omeprazole (PRILOSEC) 20 MG capsule Take 1 capsule (20 mg total) by mouth daily. For acid reflux Patient taking differently: Take 20 mg by mouth daily as needed. For acid reflux 10/06/16   Lindell Spar I, NP  ondansetron (ZOFRAN) 4 MG tablet Take 1 tablet (4 mg total) by mouth every 8 (eight) hours as needed for nausea or vomiting. 10/06/16   Lindell Spar I, NP  paliperidone (INVEGA SUSTENNA) 234 MG/1.5ML SUSP injection Inject 234 mg into the muscle every 30 (thirty) days.    [provider]  solifenacin (VESICARE) 5 MG tablet Take 1 tablet (5 mg total) by mouth daily. For weak bladder Patient taking differently: Take 5 mg by mouth daily as needed. For weak bladder 10/06/16   Lindell Spar I, NP  traZODone (DESYREL) 50 MG tablet Take 1 tablet (50 mg total) by mouth at bedtime as needed for sleep. Patient not taking: Reported on 11/05/2016 10/06/16   Encarnacion Slates, NP    Family History Family History  Problem Relation Age of Onset  . Hypertension Mother   . Diabetes Father     Social History Social History  Substance Use Topics  . Smoking status: Current Every Day Smoker    Packs/day: 1.00    Years: 7.00    Types: Cigarettes  . Smokeless tobacco: Never Used  . Alcohol use No     Allergies   Wellbutrin [bupropion]; Omnipaque [iohexol]; and Penicillins   Review of Systems Review of Systems   A complete review of systems was obtained and all systems are negative except as noted in the HPI and PMH.   Physical Exam Updated Vital Signs BP (!) 133/92 (BP Location: Right Arm)   Temp 98.7 F (37.1 C) (Oral)   Resp 18   Ht 5\' 7"  (1.702 m)   Wt 120.7 kg (266 lb)   SpO2 98%   BMI 41.66 kg/m   Physical Exam  Constitutional: She is oriented to person, place, and time. She appears well-developed and well-nourished. No distress.  HENT:  Head: Normocephalic and atraumatic.  Mouth/Throat: Oropharynx is clear and moist.  Eyes: Pupils are equal,  round, and reactive to light. Conjunctivae and EOM are normal.  Neck: Normal range of motion.  Cardiovascular: Normal rate, regular rhythm and intact distal pulses.   Pulmonary/Chest: Effort normal and breath sounds normal. No stridor.  Abdominal: Soft. Bowel sounds are normal. She exhibits no distension and no mass. There is no tenderness. There is no rebound and no guarding.  Genitourinary:  Genitourinary Comments: No CVA tenderness to percussion bilaterally  Musculoskeletal: Normal range of motion.  Bilateral feet with no lesions, edema, neurovascularly intact, no focal bony tenderness to palpation.  Neurological: She is alert and oriented to person, place, and time.  Skin: She  is not diaphoretic.  Psychiatric: She has a normal mood and affect.  Nursing note and vitals reviewed.    ED Treatments / Results  Labs (all labs ordered are listed, but only abnormal results are displayed) Labs Reviewed  URINALYSIS, ROUTINE W REFLEX MICROSCOPIC - Abnormal; Notable for the following:       Result Value   APPearance CLOUDY (*)    Ketones, ur 5 (*)    Protein, ur 30 (*)    Bacteria, UA RARE (*)    Squamous Epithelial / LPF 0-5 (*)    All other components within normal limits  CBG MONITORING, ED - Abnormal; Notable for the following:    Glucose-Capillary 134 (*)    All other components within normal limits  PREGNANCY, URINE    EKG  EKG Interpretation None       Radiology No results found.  Procedures Procedures (including critical care time)  Medications Ordered in ED Medications - No data to display   Initial Impression / Assessment and Plan / ED Course  I have reviewed the triage vital signs and the nursing notes.  Pertinent labs & imaging results that were available during my care of the patient were reviewed by me and considered in my medical decision making (see chart for details).     Vitals:   12/16/16 1430 12/16/16 1435 12/16/16 1436  BP:   (!) 133/92  Resp:    18  Temp:   98.7 F (37.1 C)  TempSrc:   Oral  SpO2: 98%    Weight:  120.7 kg (266 lb)   Height:  5\' 7"  (1.702 m)     Medications - No data to display  Danah A Hainline is 26 y.o. female presenting with Isolated urinary frequency and intermittent bilateral lower extremity for foot pain worse after long ambulation. Physical exam reassuring. Urinalysis is without significant abnormality. Encouraged her to follow closely with primary care, case management also encourages close follow-up with PCP.  Evaluation does not show pathology that would require ongoing emergent intervention or inpatient treatment. Pt is hemodynamically stable and mentating appropriately. Discussed findings and plan with patient/guardian, who agrees with care plan. All questions answered. Return precautions discussed and outpatient follow up given.    Final Clinical Impressions(s) / ED Diagnoses   Final diagnoses:  Urinary frequency  Bilateral foot pain    New Prescriptions New Prescriptions   No medications on file     Waynetta Pean 12/16/16 1542    Lacretia Leigh, MD 12/17/16 1011

## 2016-12-16 NOTE — ED Triage Notes (Signed)
Per EMS, patient c/o frequent urination for 2 weeks. She saw her primary care provider for this same issue and they prescribed her bladder medication but she does not remember the name. The medication is not helping and she wants to be reevaluated here. No pain, no odor.

## 2016-12-22 ENCOUNTER — Telehealth: Payer: Self-pay | Admitting: Emergency Medicine

## 2016-12-22 ENCOUNTER — Emergency Department (HOSPITAL_COMMUNITY)
Admission: EM | Admit: 2016-12-22 | Discharge: 2016-12-22 | Disposition: A | Discharge status: Home / Self Care | Payer: Medicaid Other | Attending: Emergency Medicine | Admitting: Emergency Medicine

## 2016-12-22 ENCOUNTER — Encounter (HOSPITAL_COMMUNITY): Payer: Self-pay | Admitting: *Deleted

## 2016-12-22 DIAGNOSIS — Z5321 Procedure and treatment not carried out due to patient leaving prior to being seen by health care provider: Secondary | ICD-10-CM | POA: Insufficient documentation

## 2016-12-22 DIAGNOSIS — R112 Nausea with vomiting, unspecified: Secondary | ICD-10-CM | POA: Diagnosis present

## 2016-12-22 MED ORDER — ONDANSETRON 4 MG PO TBDP
4.0000 mg | ORAL_TABLET | Freq: Once | ORAL | Status: AC | PRN
  Administered 2016-12-22: 4 mg via ORAL
  Filled 2016-12-22: qty 1

## 2016-12-22 NOTE — ED Notes (Signed)
Called  No response from lobby 

## 2016-12-22 NOTE — ED Triage Notes (Signed)
Per EMS, pt woke up 3 hours ago with n/v/weakness. Pt threw up once this morning. Pt was seen yesterday for abd pain and vaginal bleeding. No vomiting with EMS, denies abdominal pain.  BP 140/80 HR 90 RR 16

## 2016-12-22 NOTE — Telephone Encounter (Signed)
Called pt's PCP office to notify of pt's return to the ED.  [place] confirms pt was seen at the their office yesterday and that they discussed with her about coming to the ED so frequently.  Office states they will notify their office manager for follow up.

## 2016-12-22 NOTE — ED Notes (Signed)
Called for vital recheck... No answer

## 2017-01-04 ENCOUNTER — Emergency Department (HOSPITAL_COMMUNITY)
Admission: EM | Admit: 2017-01-04 | Discharge: 2017-01-04 | Discharge status: Against Medical Advice | Payer: Medicaid Other | Attending: Emergency Medicine | Admitting: Emergency Medicine

## 2017-01-04 ENCOUNTER — Emergency Department (HOSPITAL_COMMUNITY)
Admission: EM | Admit: 2017-01-04 | Discharge: 2017-01-05 | Disposition: A | Discharge status: Home / Self Care | Payer: Medicaid Other | Attending: Emergency Medicine | Admitting: Emergency Medicine

## 2017-01-04 ENCOUNTER — Encounter (HOSPITAL_COMMUNITY): Payer: Self-pay | Admitting: Emergency Medicine

## 2017-01-04 DIAGNOSIS — R079 Chest pain, unspecified: Secondary | ICD-10-CM | POA: Insufficient documentation

## 2017-01-04 DIAGNOSIS — F17210 Nicotine dependence, cigarettes, uncomplicated: Secondary | ICD-10-CM | POA: Insufficient documentation

## 2017-01-04 DIAGNOSIS — Z7984 Long term (current) use of oral hypoglycemic drugs: Secondary | ICD-10-CM | POA: Diagnosis not present

## 2017-01-04 DIAGNOSIS — E119 Type 2 diabetes mellitus without complications: Secondary | ICD-10-CM | POA: Insufficient documentation

## 2017-01-04 DIAGNOSIS — F329 Major depressive disorder, single episode, unspecified: Secondary | ICD-10-CM | POA: Diagnosis not present

## 2017-01-04 DIAGNOSIS — I10 Essential (primary) hypertension: Secondary | ICD-10-CM | POA: Diagnosis not present

## 2017-01-04 DIAGNOSIS — F319 Bipolar disorder, unspecified: Secondary | ICD-10-CM | POA: Insufficient documentation

## 2017-01-04 DIAGNOSIS — R45851 Suicidal ideations: Secondary | ICD-10-CM | POA: Insufficient documentation

## 2017-01-04 LAB — CBC
HCT: 38.6 % (ref 36.0–46.0)
Hemoglobin: 12.2 g/dL (ref 12.0–15.0)
MCH: 26 pg (ref 26.0–34.0)
MCHC: 31.6 g/dL (ref 30.0–36.0)
MCV: 82.3 fL (ref 78.0–100.0)
PLATELETS: 216 10*3/uL (ref 150–400)
RBC: 4.69 MIL/uL (ref 3.87–5.11)
RDW: 14.7 % (ref 11.5–15.5)
WBC: 8.7 10*3/uL (ref 4.0–10.5)

## 2017-01-04 LAB — BASIC METABOLIC PANEL
Anion gap: 11 (ref 5–15)
BUN: 11 mg/dL (ref 6–20)
CHLORIDE: 105 mmol/L (ref 101–111)
CO2: 23 mmol/L (ref 22–32)
CREATININE: 0.82 mg/dL (ref 0.44–1.00)
Calcium: 9.3 mg/dL (ref 8.9–10.3)
GFR calc non Af Amer: >60 mL/min (ref >60)
Glucose, Bld: 156 mg/dL — ABNORMAL HIGH (ref 65–99)
Potassium: 4 mmol/L (ref 3.5–5.1)
SODIUM: 139 mmol/L (ref 135–145)

## 2017-01-04 LAB — I-STAT TROPONIN, ED: TROPONIN I, POC: 0 ng/mL (ref 0.00–0.08)

## 2017-01-04 NOTE — ED Triage Notes (Signed)
Pt here x 2 today c/o SI with plan to overdose

## 2017-01-04 NOTE — ED Triage Notes (Signed)
Per EMS: pt c/o left sided intermittent CP x 3 weeks; pt given 324mg  ASA

## 2017-01-04 NOTE — ED Notes (Signed)
Pt walked out the front doors. Registration asked if they were leaving, pt looked back and said they were leaving. Pt did not stop for RN to speak with her.

## 2017-01-04 NOTE — ED Provider Notes (Signed)
TIME SEEN: 11:49 PM  CHIEF COMPLAINT: Suicidal  HPI: Patient is a 26 year old female with history of bipolar disorder, hypertension, diabetes who presents emergency department with suicidal thoughts for the past 2 days. Reports she has had increasing depression with several days and has a planned overdose. She states she's had a previous suicide attempt 4 years ago. No HI or hallucinations. No drug or alcohol use. She denies any new medical complaints at this time.  ROS: See HPI Constitutional: no fever  Eyes: no drainage  ENT: no runny nose   Cardiovascular:  no chest pain  Resp: no SOB  GI: no vomiting GU: no dysuria Integumentary: no rash  Allergy: no hives  Musculoskeletal: no leg swelling  Neurological: no slurred speech ROS otherwise negative  PAST MEDICAL HISTORY/PAST SURGICAL HISTORY:  Past Medical History:  Diagnosis Date  . Anxiety   . Bipolar 1 disorder ([place])   . Cognitive deficits   . Depression   . Diabetes mellitus without complication ([place])   . Hypertension   . Mental disorder   . Obesity     MEDICATIONS:  Prior to Admission medications   Medication Sig Start Date End Date Taking? Authorizing Provider  busPIRone (BUSPAR) 15 MG tablet Take 1 tablet (15 mg total) by mouth 2 (two) times daily. For anxiety 10/06/16  Yes Lindell Spar I, NP  DULoxetine (CYMBALTA) 60 MG capsule Take 1 capsule (60 mg total) by mouth daily. For depression 10/07/16  Yes [place], Herbert Pun I, NP  glipiZIDE (GLUCOTROL XL) 10 MG 24 hr tablet Take 1 tablet (10 mg total) by mouth every evening. For diabetes management 10/06/16  Yes Lindell Spar I, NP  metFORMIN (GLUCOPHAGE) 500 MG tablet Take 1 tablet (500 mg total) by mouth 2 (two) times daily with a meal. For diabetes management 10/06/16  Yes Lindell Spar I, NP  omeprazole (PRILOSEC) 20 MG capsule Take 1 capsule (20 mg total) by mouth daily. For acid reflux Patient taking differently: Take 20 mg by mouth daily as needed. For acid reflux 10/06/16  Yes  [place], Herbert Pun I, NP  paliperidone (INVEGA SUSTENNA) 234 MG/1.5ML SUSP injection Inject 234 mg into the muscle every 30 (thirty) days.   Yes [provider]  gabapentin (NEURONTIN) 400 MG capsule Take 1 capsule (400 mg total) by mouth 2 (two) times daily. For agitation Patient not taking: Reported on 01/04/2017 10/06/16   Lindell Spar I, NP  hydrochlorothiazide (HYDRODIURIL) 25 MG tablet Take 1 tablet (25 mg total) by mouth every evening. For high blood pressure Patient not taking: Reported on 01/04/2017 10/06/16   Lindell Spar I, NP  hydrOXYzine (ATARAX/VISTARIL) 25 MG tablet Take 1 tablet (25 mg total) by mouth every 6 (six) hours as needed for anxiety. Patient not taking: Reported on 01/04/2017 10/06/16   Lindell Spar I, NP  nicotine polacrilex (NICORETTE) 2 MG gum Take 1 each (2 mg total) by mouth as needed for smoking cessation. Patient not taking: Reported on 01/04/2017 10/06/16   Lindell Spar I, NP  ondansetron (ZOFRAN) 4 MG tablet Take 1 tablet (4 mg total) by mouth every 8 (eight) hours as needed for nausea or vomiting. Patient not taking: Reported on 01/04/2017 10/06/16   Lindell Spar I, NP  solifenacin (VESICARE) 5 MG tablet Take 1 tablet (5 mg total) by mouth daily. For weak bladder Patient not taking: Reported on 01/04/2017 10/06/16   Lindell Spar I, NP  traZODone (DESYREL) 50 MG tablet Take 1 tablet (50 mg total) by mouth at bedtime as needed  for sleep. Patient not taking: Reported on 11/05/2016 10/06/16   Lindell Spar I, NP    ALLERGIES:  Allergies  Allergen Reactions  . Wellbutrin [Bupropion] Shortness Of Breath  . Omnipaque [Iohexol] Swelling and Other (See Comments)    Reaction:  Eye swelling  . Penicillins Hives and Other (See Comments)    Has patient had a PCN reaction causing immediate rash, facial/tongue/throat swelling, SOB or lightheadedness with hypotension: No Has patient had a PCN reaction causing severe rash involving mucus membranes or skin necrosis: No Has patient  had a PCN reaction that required hospitalization No Has patient had a PCN reaction occurring within the last 10 years: No If all of the above answers are "NO", then may proceed with Cephalosporin use.    SOCIAL HISTORY:  Social History  Substance Use Topics  . Smoking status: Current Every Day Smoker    Packs/day: 1.00    Years: 7.00    Types: Cigarettes  . Smokeless tobacco: Never Used  . Alcohol use No    FAMILY HISTORY: Family History  Problem Relation Age of Onset  . Hypertension Mother   . Diabetes Father     EXAM: BP (!) 158/100 (BP Location: Right Arm)   Pulse (!) 114   Temp 98.4 F (36.9 C) (Oral)   Resp 16   Ht 5\' 7"  (1.702 m)   Wt 120.7 kg (266 lb)   SpO2 99%   BMI 41.66 kg/m  CONSTITUTIONAL: Alert and oriented and responds appropriately to questions. Well-appearing; well-nourished HEAD: Normocephalic EYES: Conjunctivae clear, pupils appear equal, EOMI ENT: normal nose; moist mucous membranes NECK: Supple, no meningismus, no nuchal rigidity, no LAD  CARD: Regular and minimally tachycardic; S1 and S2 appreciated; no murmurs, no clicks, no rubs, no gallops RESP: Normal chest excursion without splinting or tachypnea; breath sounds clear and equal bilaterally; no wheezes, no rhonchi, no rales, no hypoxia or respiratory distress, speaking full sentences ABD/GI: Normal bowel sounds; non-distended; soft, non-tender, no rebound, no guarding, no peritoneal signs, no hepatosplenomegaly BACK:  The back appears normal and is non-tender to palpation, there is no CVA tenderness EXT: Normal ROM in all joints; non-tender to palpation; no edema; normal capillary refill; no cyanosis, no calf tenderness or swelling    SKIN: Normal color for age and race; warm; no rash NEURO: Moves all extremities equally PSYCH: Patient is suicidal with plan to overdose. Unable to contract for safety. No HI or hallucinations. Grooming and personal hygiene are appropriate.  MEDICAL DECISION  MAKING: Patient here with suicidal thoughts and planned overdose. At this time unable to contract for safety. She has no new medical complaints. We'll obtain screening labs, urine and consult TTS. Patient is here voluntarily.  ED PROGRESS: 1:20 AM  Spoke with [place] with TTS.  They recommend AM psych eval.  1:50 AM  Pt's Labs and urine are unremarkable. She is medically cleared.  I reviewed all nursing notes, vitals, pertinent previous records, EKGs, lab and urine results, imaging (as available).      Jarae Panas, Delice Bison, DO 01/05/17 [phone]

## 2017-01-04 NOTE — ED Notes (Signed)
Pt instructed to change into maroon scrubs and place belongings into bag.

## 2017-01-05 DIAGNOSIS — R45851 Suicidal ideations: Secondary | ICD-10-CM

## 2017-01-05 DIAGNOSIS — F319 Bipolar disorder, unspecified: Secondary | ICD-10-CM

## 2017-01-05 DIAGNOSIS — F17210 Nicotine dependence, cigarettes, uncomplicated: Secondary | ICD-10-CM

## 2017-01-05 LAB — CBC WITH DIFFERENTIAL/PLATELET
BASOS ABS: 0 10*3/uL (ref 0.0–0.1)
Basophils Relative: 0 %
Eosinophils Absolute: 0.3 10*3/uL (ref 0.0–0.7)
Eosinophils Relative: 3 %
HEMATOCRIT: 38.3 % (ref 36.0–46.0)
HEMOGLOBIN: 12 g/dL (ref 12.0–15.0)
LYMPHS PCT: 42 %
Lymphs Abs: 4.1 10*3/uL — ABNORMAL HIGH (ref 0.7–4.0)
MCH: 25.9 pg — ABNORMAL LOW (ref 26.0–34.0)
MCHC: 31.3 g/dL (ref 30.0–36.0)
MCV: 82.5 fL (ref 78.0–100.0)
MONO ABS: 0.5 10*3/uL (ref 0.1–1.0)
Monocytes Relative: 5 %
NEUTROS ABS: 4.7 10*3/uL (ref 1.7–7.7)
NEUTROS PCT: 50 %
Platelets: 203 10*3/uL (ref 150–400)
RBC: 4.64 MIL/uL (ref 3.87–5.11)
RDW: 14.7 % (ref 11.5–15.5)
WBC: 9.6 10*3/uL (ref 4.0–10.5)

## 2017-01-05 LAB — I-STAT BETA HCG BLOOD, ED (MC, WL, AP ONLY): I-stat hCG, quantitative: <5 m[IU]/mL (ref <5)

## 2017-01-05 LAB — COMPREHENSIVE METABOLIC PANEL
ALBUMIN: 3.9 g/dL (ref 3.5–5.0)
ALK PHOS: 37 U/L — AB (ref 38–126)
ALT: 25 U/L (ref 14–54)
AST: 23 U/L (ref 15–41)
Anion gap: 6 (ref 5–15)
BILIRUBIN TOTAL: 0.6 mg/dL (ref 0.3–1.2)
BUN: 13 mg/dL (ref 6–20)
CALCIUM: 9.3 mg/dL (ref 8.9–10.3)
CO2: 25 mmol/L (ref 22–32)
CREATININE: 0.91 mg/dL (ref 0.44–1.00)
Chloride: 107 mmol/L (ref 101–111)
GFR calc non Af Amer: >60 mL/min (ref >60)
Glucose, Bld: 157 mg/dL — ABNORMAL HIGH (ref 65–99)
Potassium: 3.6 mmol/L (ref 3.5–5.1)
Sodium: 138 mmol/L (ref 135–145)
TOTAL PROTEIN: 6.9 g/dL (ref 6.5–8.1)

## 2017-01-05 LAB — RAPID URINE DRUG SCREEN, HOSP PERFORMED
Amphetamines: NOT DETECTED
BARBITURATES: NOT DETECTED
Benzodiazepines: NOT DETECTED
COCAINE: NOT DETECTED
Opiates: NOT DETECTED
Tetrahydrocannabinol: NOT DETECTED

## 2017-01-05 LAB — ACETAMINOPHEN LEVEL

## 2017-01-05 LAB — SALICYLATE LEVEL

## 2017-01-05 LAB — ETHANOL: Alcohol, Ethyl (B): <5 mg/dL (ref <5)

## 2017-01-05 MED ORDER — PANTOPRAZOLE SODIUM 40 MG PO TBEC: 40.0000 mg | DELAYED_RELEASE_TABLET | Freq: Every day | ORAL | Status: DC | PRN

## 2017-01-05 MED ORDER — GLIPIZIDE ER 10 MG PO TB24
10.0000 mg | ORAL_TABLET | Freq: Every day | ORAL | Status: DC
  Filled 2017-01-05: qty 1

## 2017-01-05 MED ORDER — BUSPIRONE HCL 10 MG PO TABS
15.0000 mg | ORAL_TABLET | Freq: Two times a day (BID) | ORAL | Status: DC
  Administered 2017-01-05: 15 mg via ORAL
  Filled 2017-01-05: qty 2

## 2017-01-05 MED ORDER — METFORMIN HCL 500 MG PO TABS
500.0000 mg | ORAL_TABLET | Freq: Two times a day (BID) | ORAL | Status: DC
Start: 2017-01-05 — End: 2017-01-05
  Administered 2017-01-05: 500 mg via ORAL
  Filled 2017-01-05: qty 1

## 2017-01-05 MED ORDER — DULOXETINE HCL 60 MG PO CPEP
60.0000 mg | ORAL_CAPSULE | Freq: Every day | ORAL | Status: DC
  Administered 2017-01-05: 60 mg via ORAL
  Filled 2017-01-05 (×2): qty 1

## 2017-01-05 MED ORDER — PALIPERIDONE PALMITATE 234 MG/1.5ML IM SUSP: 234.0000 mg | INTRAMUSCULAR | Status: DC

## 2017-01-05 NOTE — Consult Note (Signed)
Telepsych Consultation   Reason for Consult: Depression Referring Physician:  EDP Location of Patient: [facility] ED Location of Provider: [facility]  Patient Identification: [name] MRN:  [id number] Principal Diagnosis: <principal problem not specified> Diagnosis:   Patient Active Problem List   Diagnosis Date Noted  . OCD (obsessive compulsive disorder) [F42.9] 10/05/2016  . Major depressive disorder, recurrent episode, mild ([place]) [F33.0] 05/04/2016  . 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 Learning disability [F81.9] 07/18/2015  . 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 Diabetes mellitus ([place]) [E11.9] 07/18/2015  . MDD (major depressive disorder), recurrent, severe, with psychosis ([place]) [F33.3] 07/18/2015  . Hyperlipidemia [E78.5] 07/18/2015  . Severe episode of recurrent major depressive disorder, without psychotic features ([place]) [F33.2]   . Drug overdose [T50.901A]   . Cognitive deficits [R41.89] 10/12/2012  . Generalized anxiety disorder [F41.1] 06/28/2012    Total Time spent with patient: 30 minutes  Subjective:   [name] is a 26 y.o. female patient admitted with Bipolar 1 disorder, per medical history.  HPI: Per the TTS assessment completed on 01/05/17 by Leroy Sea: [name] is an 26 y.o.single female, voluntarily brought into [facility], by her [employer].  Patient reported Suicidal ideations, with a plan to overdose on her prescribed medications.  Patient stated that she has 3-4 previous attempts, consisting of attempted overdoses.  Patient denies homicidal ideations, auditory/visual hallucinations, self-injurious behaviors, substance use, or access to weapons.  Patient reported ongoing experiences with depressive symptoms, such as despondency, fatigue and tearfulness.    Patient reported currently residing with her mother and sister. Patient stated that she is currently receiving disability benefits.   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 Patient denies history of arrests, probation/parole, upcoming court dates, physical abuse, sexual abuse, or verbal abuse.  Patient reported no family history of suicide and substance abuse. Patient stated receiving inpatient treatment for at [facility] for depression.  Patient stated that she currently receives outpatient treatment with [facility].  Patient reported not being compliant with her medications, due to the side effects experienced.    During assessment, Patient was calm and cooperative.  Patient was dressed in scrubs and laying in her bed. Patient was oriented to person, place, time, and situation. Patient's eye contact was fair.  Patient's motor activity consisted of freedom of movement.  Patient's speech was logical, coherent, slow, and slurred.  Patient's level of consciousness was alert.  Patient's mood appeared to be pleasant.  Patient's flat.  Patient's thought process was coherent, relevant, and circumstantial.  Patient's judgment appeared to be partially impaired.  On Exam: Patient was seen via tele-psych, chart reviewed with treatment team. Patient in bed, awake, alert and oriented x4. Patient reiterated the reason for this hospital admission as documented above. Patient stated, "I came to the hospital because some lady was saying something about me at the bus station". Patient unable to give details of what was said and how it warranted her visit to the hospital. Patient was however very pleasant today and stated that she does not need to be in the hospital. She stated that she supposed to be starting a PSR program and have her assessment scheduled for today. She stated that she is excited about that. She also stated that she has an appointment with a therapist tomorrow. Patient denies any SI/HI/VAH, she stated that she takes her medications as  prescribed  and does not use any illicit drugs. Patient lives with mother and has community support with [employer]. Patient does not appear to be responding to any stimuli during this encounter.   Past Psychiatric History: See H&P  Risk to Self: Suicidal Ideation: Yes-Currently Present Suicidal Intent: Yes-Currently Present Is patient at risk for suicide?: Yes Suicidal Plan?: Yes-Currently Present Specify Current Suicidal Plan: Patient reports having a plan to overdose with her prescribed pills.  Access to Means: Yes Specify Access to Suicidal Means: Patient reports having access to pills. What has been your use of drugs/alcohol within the last 12 months?: Patient denies. How many times?: 4 Other Self Harm Risks: Patient denies. Triggers for Past Attempts: Other personal contacts, Family contact Intentional Self Injurious Behavior: None Comment - Self Injurious Behavior: Patient denies Risk to Others: Homicidal Ideation: No Thoughts of Harm to Others: No Current Homicidal Intent: No Current Homicidal Plan: No Access to Homicidal Means: No Identified Victim: Patient denies. History of harm to others?: No Assessment of Violence: None Noted Violent Behavior Description: Patient denies Does patient have access to weapons?: No Criminal Charges Pending?: No Does patient have a court date: No Prior Inpatient Therapy: Prior Inpatient Therapy: Yes Prior Therapy Dates: 2016, 2017, 2018 Prior Therapy Facilty/Provider(s): [facility] [place] Reason for Treatment: Depression Prior Outpatient Therapy: Prior Outpatient Therapy: Yes Prior Therapy Dates: Current Prior Therapy Facilty/Provider(s): [facility] Reason for Treatment: Depression Does patient have an ACCT team?: No Does patient have Intensive In-House Services?  : No Does patient have [facility] services? : Yes Does patient have P4CC services?: No  Past Medical History:  Past Medical History:  Diagnosis  Date  . Anxiety   . Bipolar 1 disorder ([place])   . Cognitive deficits   . Depression   . Diabetes mellitus without complication ([place])   . Hypertension   . Mental disorder   . Obesity     Past Surgical History:  Procedure Laterality Date  . CESAREAN SECTION    . CESAREAN SECTION N/A 04/25/2013   Procedure: REPEAT CESAREAN SECTION;  Surgeon: [name], MD;  Location: [facility];  Service: Obstetrics;  Laterality: N/A;  . MASS EXCISION N/A 06/03/2012   Procedure: EXCISION MASS;  Surgeon: [name], MD;  Location: [place];  Service: ENT;  Laterality: N/A;  Excision uvula mass  . TONSILLECTOMY N/A 06/03/2012   Procedure: TONSILLECTOMY;  Surgeon: [name], MD;  Location: [facility];  Service: ENT;  Laterality: N/A;  . TONSILLECTOMY     Family History:  Family History  Problem Relation Age of Onset  . Hypertension Mother   . Diabetes Father    Family Psychiatric  History: Unknown  Social History:  History  Alcohol Use No     History  Drug Use No    Comment: Patient denies    Social History   Social History  . Marital status: Single    Spouse name: N/A  . Number of children: N/A  . Years of education: N/A   Social History Main Topics  . Smoking status: Current Every Day Smoker    Packs/day: 1.00    Years: 7.00    Types: Cigarettes  . Smokeless tobacco: Never Used  . Alcohol use No  . Drug use: No     Comment: Patient denies  . Sexual activity: Not Currently   Other Topics Concern  . None   Social History Narrative  . None   Additional Social History:  Allergies:   Allergies  Allergen Reactions  . Wellbutrin [Bupropion] Shortness Of Breath  . Omnipaque [Iohexol] Swelling and Other (See Comments)    Reaction:  Eye swelling  . Penicillins Hives and Other (See Comments)    Has patient had a PCN reaction causing immediate rash, facial/tongue/throat swelling, SOB or lightheadedness with hypotension: No Has patient  had a PCN reaction causing severe rash involving mucus membranes or skin necrosis: No Has patient had a PCN reaction that required hospitalization No Has patient had a PCN reaction occurring within the last 10 years: No If all of the above answers are "NO", then may proceed with Cephalosporin use.    Labs:  Results for orders placed or performed during the hospital encounter of 01/04/17 (from the past 48 hour(s))  Comprehensive metabolic panel     Status: Abnormal   Collection Time: 01/05/17 12:55 AM  Result Value Ref Range   Sodium 138 135 - 145 mmol/L   Potassium 3.6 3.5 - 5.1 mmol/L   Chloride 107 101 - 111 mmol/L   CO2 25 22 - 32 mmol/L   Glucose, Bld 157 (H) 65 - 99 mg/dL   BUN 13 6 - 20 mg/dL   Creatinine, Ser 0.91 0.44 - 1.00 mg/dL   Calcium 9.3 8.9 - 10.3 mg/dL   Total Protein 6.9 6.5 - 8.1 g/dL   Albumin 3.9 3.5 - 5.0 g/dL   AST 23 15 - 41 U/L   ALT 25 14 - 54 U/L   Alkaline Phosphatase 37 (L) 38 - 126 U/L   Total Bilirubin 0.6 0.3 - 1.2 mg/dL   GFR calc non Af Amer >60 >60 mL/min   GFR calc Af Amer >60 >60 mL/min    Comment: (NOTE) The eGFR has been calculated using the CKD EPI equation. This calculation has not been validated in all clinical situations. eGFR's persistently <60 mL/min signify possible Chronic Kidney Disease.    Anion gap 6 5 - 15  Ethanol     Status: None   Collection Time: 01/05/17 12:55 AM  Result Value Ref Range   Alcohol, Ethyl (B) <5 <5 mg/dL    Comment:        LOWEST DETECTABLE LIMIT FOR SERUM ALCOHOL IS 5 mg/dL FOR MEDICAL PURPOSES ONLY   CBC with Diff     Status: Abnormal   Collection Time: 01/05/17 12:55 AM  Result Value Ref Range   WBC 9.6 4.0 - 10.5 K/uL   RBC 4.64 3.87 - 5.11 MIL/uL   Hemoglobin 12.0 12.0 - 15.0 g/dL   HCT 38.3 36.0 - 46.0 %   MCV 82.5 78.0 - 100.0 fL   MCH 25.9 (L) 26.0 - 34.0 pg   MCHC 31.3 30.0 - 36.0 g/dL   RDW 14.7 11.5 - 15.5 %   Platelets 203 150 - 400 K/uL   Neutrophils Relative % 50 %   Neutro Abs  4.7 1.7 - 7.7 K/uL   Lymphocytes Relative 42 %   Lymphs Abs 4.1 (H) 0.7 - 4.0 K/uL   Monocytes Relative 5 %   Monocytes Absolute 0.5 0.1 - 1.0 K/uL   Eosinophils Relative 3 %   Eosinophils Absolute 0.3 0.0 - 0.7 K/uL   Basophils Relative 0 %   Basophils Absolute 0.0 0.0 - 0.1 K/uL  Salicylate level     Status: None   Collection Time: 01/05/17 12:55 AM  Result Value Ref Range   Salicylate Lvl <9.2 2.8 - 30.0 mg/dL  Acetaminophen level  Status: Abnormal   Collection Time: 01/05/17 12:55 AM  Result Value Ref Range   Acetaminophen (Tylenol), Serum <10 (L) 10 - 30 ug/mL    Comment:        THERAPEUTIC CONCENTRATIONS VARY SIGNIFICANTLY. A RANGE OF 10-30 ug/mL MAY BE AN EFFECTIVE CONCENTRATION FOR MANY PATIENTS. HOWEVER, SOME ARE BEST TREATED AT CONCENTRATIONS OUTSIDE THIS RANGE. ACETAMINOPHEN CONCENTRATIONS >150 ug/mL AT 4 HOURS AFTER INGESTION AND >50 ug/mL AT 12 HOURS AFTER INGESTION ARE OFTEN ASSOCIATED WITH TOXIC REACTIONS.   Urine rapid drug screen (hosp performed)     Status: None   Collection Time: 01/05/17 12:57 AM  Result Value Ref Range   Opiates NONE DETECTED NONE DETECTED   Cocaine NONE DETECTED NONE DETECTED   Benzodiazepines NONE DETECTED NONE DETECTED   Amphetamines NONE DETECTED NONE DETECTED   Tetrahydrocannabinol NONE DETECTED NONE DETECTED   Barbiturates NONE DETECTED NONE DETECTED    Comment:        DRUG SCREEN FOR MEDICAL PURPOSES ONLY.  IF CONFIRMATION IS NEEDED FOR ANY PURPOSE, NOTIFY LAB WITHIN 5 DAYS.        LOWEST DETECTABLE LIMITS FOR URINE DRUG SCREEN Drug Class       Cutoff (ng/mL) Amphetamine      1000 Barbiturate      200 Benzodiazepine   277 Tricyclics       412 Opiates          300 Cocaine          300 THC              50   I-Stat beta hCG blood, ED     Status: None   Collection Time: 01/05/17  1:05 AM  Result Value Ref Range   I-stat hCG, quantitative <5.0 <5 mIU/mL   Comment 3            Comment:   GEST. AGE      CONC.   (mIU/mL)   <=1 WEEK        5 - 50     2 WEEKS       50 - 500     3 WEEKS       100 - 10,000     4 WEEKS     1,000 - 30,000        FEMALE AND NON-PREGNANT FEMALE:     LESS THAN 5 mIU/mL     Medications:  Current Facility-Administered Medications  Medication Dose Route Frequency Provider Last Rate Last Dose  . busPIRone (BUSPAR) tablet 15 mg  15 mg Oral BID Ward, Kristen N, DO   15 mg at 01/05/17 0931  . DULoxetine (CYMBALTA) DR capsule 60 mg  60 mg Oral Daily Ward, Kristen N, DO   60 mg at 01/05/17 0931  . glipiZIDE (GLUCOTROL XL) 24 hr tablet 10 mg  10 mg Oral Q supper Ward, Kristen N, DO      . metFORMIN (GLUCOPHAGE) tablet 500 mg  500 mg Oral BID WC Ward, Kristen N, DO   500 mg at 01/05/17 0931  . pantoprazole (PROTONIX) EC tablet 40 mg  40 mg Oral Daily PRN Ward, Delice Bison, DO       Current Outpatient Prescriptions  Medication Sig Dispense Refill  . busPIRone (BUSPAR) 15 MG tablet Take 1 tablet (15 mg total) by mouth 2 (two) times daily. For anxiety 60 tablet 0  . DULoxetine (CYMBALTA) 60 MG capsule Take 1 capsule (60 mg total) by mouth daily. For depression 30 capsule  0  . glipiZIDE (GLUCOTROL XL) 10 MG 24 hr tablet Take 1 tablet (10 mg total) by mouth every evening. For diabetes management 30 tablet 0  . metFORMIN (GLUCOPHAGE) 500 MG tablet Take 1 tablet (500 mg total) by mouth 2 (two) times daily with a meal. For diabetes management 60 tablet 0  . omeprazole (PRILOSEC) 20 MG capsule Take 1 capsule (20 mg total) by mouth daily. For acid reflux (Patient taking differently: Take 20 mg by mouth daily as needed. For acid reflux) 10 capsule 0  . paliperidone (INVEGA SUSTENNA) 234 MG/1.5ML SUSP injection Inject 234 mg into the muscle every 30 (thirty) days.      Musculoskeletal: UTA via camera  Psychiatric Specialty Exam: Physical Exam  Nursing note and vitals reviewed.   Review of Systems  Psychiatric/Behavioral: Positive for depression (stable). Negative for hallucinations,  memory loss, substance abuse and suicidal ideas. The patient is not nervous/anxious and does not have insomnia.     Blood pressure 129/79, pulse 98, temperature 98 F (36.7 C), temperature source Oral, resp. rate 18, height '5\' 7"'  (1.702 m), weight 120.7 kg (266 lb), SpO2 99 %.Body mass index is 41.66 kg/m.  General Appearance: on remarkable  Eye Contact:  Good  Speech:  Clear and Coherent and Normal Rate  Volume:  Normal  Mood:  Euthymic  Affect:  Appropriate  Thought Process:  Coherent and Goal Directed  Orientation:  Full (Time, Place, and Person)  Thought Content:  WDL and Logical  Suicidal Thoughts:  No  Homicidal Thoughts:  No  Memory:  Immediate;   Good Recent;   Good Remote;   Good  Judgement:  Good  Insight:  Good and Present  Psychomotor Activity:  Normal  Concentration:  Concentration: Good and Attention Span: Good  Recall:  Good  Fund of Knowledge:  Good  Language:  Good  Akathisia:  Negative  Handed:  Right  AIMS (if indicated):     Assets:  Communication Skills Desire for Improvement Financial Resources/Insurance Housing Leisure Time Physical Health Resilience Social Support  ADL's:  Intact  Cognition:  WNL  Sleep:      Patient's case discussed with Dr. Dwyane Dee with the following recommendations:  Treatment Plan Summary: Plan to discharge patient home  Follow up with [facility] for therapy and medication management Follow up with Social Work consult for Care coordination Take all medications as prescribed Avoid the use of alcohol and/or drugs Stay well hydrated Activity as tolerated Follow up with PCP for any new or existing medical concerns   Disposition: No evidence of imminent risk to self or others at present.   Patient does not meet criteria for psychiatric inpatient admission. Supportive therapy provided about ongoing stressors. Refer to IOP. Discussed crisis plan, support from social network, calling 911,  coming to the Emergency Department, and calling Suicide Hotline.  This service was provided via telemedicine using a 2-way, interactive audio and video technology.  Names of all persons participating in this telemedicine service and their role in this encounter. Name: [name] Role: Patient  Name: Bobetta Korf A. Glendale Wherry  Role: NP           Vicenta Aly, NP 01/05/2017 11:13 AM

## 2017-01-05 NOTE — ED Provider Notes (Signed)
12:10 PM patient is no longer feeling suicidal. She states she felt this way after an argument last night. I doubt that she has at increased risk to harm herself. She feels comfortable with discharge and plans to see [facility] tomorrow. Psychiatry in agreement. Discharge home with return precautions.   Sherwood Gambler, MD 01/05/17 1212

## 2017-01-05 NOTE — BHH Counselor (Signed)
Per [name], PA-C: AM Psychriatric Evaluation recommended. Attending Provider, Ward, MD notified at [employer].

## 2017-01-05 NOTE — ED Notes (Signed)
TTS machine at bedside. 

## 2017-01-05 NOTE — ED Notes (Signed)
Jolan, CSW at [facility] advised this RN that patient does not meet criteria for inpatient placement and that she can be discharged from ED with follow up with her community support team and [facility]. This RN will make EDP aware of needed disposition for discharge.

## 2017-01-05 NOTE — BH Assessment (Signed)
Tele Assessment Note   Patient Name: [name] MRN: [id number] Referring Physician: Pryor Curia, MD Location of Patient: [name] Location of Provider: [place] is an 26 y.o.single female, voluntarily brought into [facility], by her [employer].  Patient reported Suicidal ideations, with a plan to overdose on her prescribed medications.  Patient stated that she has 3-4 previous attempts, consisting of attempted overdoses.  Patient denies homicidal ideations, auditory/visual hallucinations, self-injurious behaviors, substance use, or access to weapons.  Patient reported ongoing experiences with depressive symptoms, such as despondency, fatigue and tearfulness.    Patient reported currently residing with her mother and sister. Patient stated that she is currently receiving disability benefits.  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 Patient denies history of arrests, probation/parole, upcoming court dates, physical abuse, sexual abuse, or verbal abuse.  Patient reported no family history of suicide and substance abuse. Patient stated receiving inpatient treatment for at [facility] for depression.  Patient stated that she currently receives outpatient treatment with [facility].  Patient reported not being compliant with her medications, due to the side effects experienced.    During assessment, Patient was calm and cooperative.  Patient was dressed in scrubs and laying in her bed. Patient was oriented to person, place, time, and situation. Patient's eye contact was fair.  Patient's motor activity consisted of freedom of movement.  Patient's speech was logical, coherent, slow, and slurred.  Patient's level of consciousness was alert.  Patient's mood appeared to be pleasant.  Patient's flat.  Patient's thought process was coherent, relevant, and  circumstantial.  Patient's judgment appeared to be partially impaired.   Diagnosis: Bipolar 1 disorder, per medical history  Past Medical History:  Past Medical History:  Diagnosis Date  . Anxiety   . Bipolar 1 disorder ([place])   . Cognitive deficits   . Depression   . Diabetes mellitus without complication ([place])   . Hypertension   . Mental disorder   . Obesity     Past Surgical History:  Procedure Laterality Date  . CESAREAN SECTION    . CESAREAN SECTION N/A 04/25/2013   Procedure: REPEAT CESAREAN SECTION;  Surgeon: [name], MD;  Location: [facility];  Service: Obstetrics;  Laterality: N/A;  . MASS EXCISION N/A 06/03/2012   Procedure: EXCISION MASS;  Surgeon: [name], MD;  Location: [place];  Service: ENT;  Laterality: N/A;  Excision uvula mass  . TONSILLECTOMY N/A 06/03/2012   Procedure: TONSILLECTOMY;  Surgeon: [name], MD;  Location: [place];  Service: ENT;  Laterality: N/A;  . TONSILLECTOMY      Family History:  Family History  Problem Relation Age of Onset  . Hypertension Mother   . Diabetes Father     Social History:  reports that she has been smoking Cigarettes.  She has a 7.00 pack-year smoking history. She has never used smokeless tobacco. She reports that she does not drink alcohol or use drugs.  Additional Social History:  Alcohol / Drug Use Pain Medications: See MAR Prescriptions: See MAR Over the Counter: See MAR History of alcohol / drug use?: No history of alcohol / drug abuse Longest period of sobriety (when/how long): N/A  CIWA: CIWA-Ar BP: 118/82 Pulse Rate: (!) 105 COWS:    PATIENT STRENGTHS: (choose at least two) Ability for insight Communication skills General fund of knowledge Motivation  for treatment/growth Supportive family/friends  Allergies:  Allergies  Allergen Reactions  . Wellbutrin [Bupropion] Shortness Of Breath  . Omnipaque [Iohexol] Swelling and Other (See Comments)     Reaction:  Eye swelling  . Penicillins Hives and Other (See Comments)    Has patient had a PCN reaction causing immediate rash, facial/tongue/throat swelling, SOB or lightheadedness with hypotension: No Has patient had a PCN reaction causing severe rash involving mucus membranes or skin necrosis: No Has patient had a PCN reaction that required hospitalization No Has patient had a PCN reaction occurring within the last 10 years: No If all of the above answers are "NO", then may proceed with Cephalosporin use.    Home Medications:  (Not in a hospital admission)  OB/GYN Status:  No LMP recorded. Patient has had an implant.  General Assessment Data Location of Assessment: [facility] ED TTS Assessment: In system Is this a Tele or Face-to-Face Assessment?: Tele Assessment Is this an Initial Assessment or a Re-assessment for this encounter?: Initial Assessment Marital status: Single Is patient pregnant?: No Pregnancy Status: No Living Arrangements: Parent, Other relatives (Pt. reports living with her mother and sister) Can pt return to current living arrangement?: Yes Admission Status: Voluntary Is patient capable of signing voluntary admission?: Yes Referral Source: Self/Family/Friend Insurance type: Medicaid     Crisis Care Plan Living Arrangements: Parent, Other relatives (Pt. reports living with her mother and sister) Legal Guardian: Other: (Self) Name of Psychiatrist: Dr. Bernita Raisin Name of Therapist: [facility]  Education Status Is patient currently in school?: No Current Grade: N/A Highest grade of school patient has completed: 41 Name of school: NA Contact person: NA  Risk to self with the past 6 months Suicidal Ideation: Yes-Currently Present Has patient been a risk to self within the past 6 months prior to admission? : No Suicidal Intent: Yes-Currently Present Has patient had any suicidal intent within the past 6 months prior to admission? : No Is  patient at risk for suicide?: Yes Suicidal Plan?: Yes-Currently Present Has patient had any suicidal plan within the past 6 months prior to admission? : Yes Specify Current Suicidal Plan: Patient reports having a plan to overdose with her prescribed pills.  Access to Means: Yes Specify Access to Suicidal Means: Patient reports having access to pills. What has been your use of drugs/alcohol within the last 12 months?: Patient denies. Previous Attempts/Gestures: Yes How many times?: 4 Other Self Harm Risks: Patient denies. Triggers for Past Attempts: Other personal contacts, Family contact Intentional Self Injurious Behavior: None Comment - Self Injurious Behavior: Patient denies Family Suicide History: No Recent stressful life event(s): Conflict (Comment) (Pt. reports conflict with another female.) Persecutory voices/beliefs?: No Depression: No Depression Symptoms: Fatigue, Tearfulness Substance abuse history and/or treatment for substance abuse?: No Suicide prevention information given to non-admitted patients: Not applicable  Risk to Others within the past 6 months Homicidal Ideation: No Does patient have any lifetime risk of violence toward others beyond the six months prior to admission? : No Thoughts of Harm to Others: No Current Homicidal Intent: No Current Homicidal Plan: No Access to Homicidal Means: No Identified Victim: Patient denies. History of harm to others?: No Assessment of Violence: None Noted Violent Behavior Description: Patient denies Does patient have access to weapons?: No Criminal Charges Pending?: No Does patient have a court date: No Is patient on probation?: No  Psychosis Hallucinations: None noted Delusions: None noted  Mental Status Report Appearance/Hygiene: In scrubs Eye Contact: Fair Motor Activity: Freedom of  movement Speech: Logical/coherent, Slow, Slurred Level of Consciousness: Alert Mood: Pleasant Affect: Appropriate to  circumstance Anxiety Level: None Thought Processes: Coherent, Relevant, Circumstantial Judgement: Partial Orientation: Person, Time, Place, Situation Obsessive Compulsive Thoughts/Behaviors: None  Cognitive Functioning Concentration: Fair Memory: Recent Intact, Remote Intact IQ: Average Insight: Poor Impulse Control: Poor Appetite: Good Weight Loss: 0 Weight Gain: 0 Sleep: No Change Total Hours of Sleep: 5 Vegetative Symptoms: None  ADLScreening [facility] Assessment Services) Patient's cognitive ability adequate to safely complete daily activities?: Yes Patient able to express need for assistance with ADLs?: Yes Independently performs ADLs?: Yes (appropriate for developmental age)  Prior Inpatient Therapy Prior Inpatient Therapy: Yes Prior Therapy Dates: 2016, 2017, 2018 Prior Therapy Facilty/Provider(s): [facility], [place] Reason for Treatment: Depression  Prior Outpatient Therapy Prior Outpatient Therapy: Yes Prior Therapy Dates: Current Prior Therapy Facilty/Provider(s): [facility] Reason for Treatment: Depression Does patient have an ACCT team?: No Does patient have Intensive In-House Services?  : No Does patient have [facility] services? : Yes Does patient have P4CC services?: No  ADL Screening (condition at time of admission) Patient's cognitive ability adequate to safely complete daily activities?: Yes Is the patient deaf or have difficulty hearing?: No Does the patient have difficulty seeing, even when wearing glasses/contacts?: No Does the patient have difficulty concentrating, remembering, or making decisions?: No Patient able to express need for assistance with ADLs?: Yes Does the patient have difficulty dressing or bathing?: No Independently performs ADLs?: Yes (appropriate for developmental age) Does the patient have difficulty walking or climbing stairs?: No Weakness of Legs: None Weakness of Arms/Hands: None  Home Assistive  Devices/Equipment Home Assistive Devices/Equipment: None    Abuse/Neglect Assessment (Assessment to be complete while patient is alone) Physical Abuse: Denies Verbal Abuse: Denies Sexual Abuse: Denies Exploitation of patient/patient's resources: Denies Self-Neglect: Denies     Regulatory affairs officer (For Healthcare) Does Patient Have a Medical Advance Directive?: No Would patient like information on creating a medical advance directive?: No - Patient declined    Additional Information 1:1 In Past 12 Months?: No CIRT Risk: No Elopement Risk: No Does patient have medical clearance?: Yes     Disposition:  Disposition Initial Assessment Completed for this Encounter: Yes Disposition of Patient: Other dispositions (Per [name], PA-C) Other disposition(s): Other (Comment) (AM psych evaluation reccommended)  This service was provided via telemedicine using a 2-way, interactive audio and video technology.  Names of all persons participating in this telemedicine service and their role in this encounter. Name: [name] Role: Patient  Name: Pryor Curia, MD Role: [place]  Name: Leroy Sea, LPC-A, LCAS-A Role: Therapeutic Triage Specialist  Name: [name], PA-C Role: Therapeutic Triage Provider    Marcine Matar 01/05/2017 1:09 AM

## 2017-01-05 NOTE — ED Notes (Signed)
Dr. Regenia Skeeter at bedside to speak with patient who advised she is supposed to see [facility] tomorrow morning.

## 2017-01-05 NOTE — ED Notes (Signed)
All belongings and valuables given to patient, pt signed stating she received all belongings and valuables and forms placed in discharged inventory notebook.

## 2017-01-05 NOTE — ED Notes (Signed)
Breakfast tray ordered per night shift report from [place], [place]

## 2017-01-05 NOTE — Progress Notes (Signed)
Per Hughie Closs, NP, the patient does not meet criteria for inpatient treatment. The patient is recommended for discharge and to follow up with her [employer] team and providers at [facility].   Scherrie Gerlach, RN notified.   Radonna Ricker MSW, [place] Disposition [phone]

## 2017-01-05 NOTE — ED Notes (Signed)
Dr. Regenia Skeeter made aware of [facility] recommended disposition of discharge from ED

## 2017-01-05 NOTE — ED Notes (Signed)
Pt given turkey sandwich and sprite.  

## 2017-01-13 ENCOUNTER — Encounter: Payer: Self-pay | Admitting: Obstetrics and Gynecology

## 2017-01-13 ENCOUNTER — Ambulatory Visit (INDEPENDENT_AMBULATORY_CARE_PROVIDER_SITE_OTHER): Payer: Medicaid Other | Admitting: Obstetrics and Gynecology

## 2017-01-13 ENCOUNTER — Other Ambulatory Visit (HOSPITAL_COMMUNITY)
Admission: RE | Admit: 2017-01-13 | Discharge: 2017-01-13 | Disposition: A | Discharge status: Home / Self Care | Payer: Medicaid Other | Source: Ambulatory Visit | Attending: Obstetrics and Gynecology | Admitting: Obstetrics and Gynecology

## 2017-01-13 VITALS — BP 130/99 | HR 106 | Ht 67.0 in | Wt 269.0 lb

## 2017-01-13 DIAGNOSIS — Z3049 Encounter for surveillance of other contraceptives: Secondary | ICD-10-CM

## 2017-01-13 DIAGNOSIS — Z01419 Encounter for gynecological examination (general) (routine) without abnormal findings: Secondary | ICD-10-CM

## 2017-01-13 DIAGNOSIS — Z124 Encounter for screening for malignant neoplasm of cervix: Secondary | ICD-10-CM | POA: Diagnosis not present

## 2017-01-13 DIAGNOSIS — Z30017 Encounter for initial prescription of implantable subdermal contraceptive: Secondary | ICD-10-CM

## 2017-01-13 MED ORDER — ETONOGESTREL 68 MG ~~LOC~~ IMPL
68.0000 mg | DRUG_IMPLANT | Freq: Once | SUBCUTANEOUS | Status: AC
  Administered 2017-01-13: 68 mg via SUBCUTANEOUS

## 2017-01-13 NOTE — Procedures (Signed)
Nexplanon Removal and Insertion Procedure Note Prior to the procedure being performed, the patient (or guardian) was asked to state their full name, date of birth, type of procedure being performed and the exact location of the operative site. This information was then checked against the documentation in the patient's chart. Prior to the procedure being performed, a "time out" was performed by the physician that confirmed the correct patient, procedure and site.  A pap smear was done; it was extremely difficult due to long vagina, and cervix being very high and anterior. After informed consent was obtained, the patient's left arm was palpated and the old nexplanon (placed early 2015 after she delivered, per patient) was easily felt at the old insertion site. This area was cleaned with alcohol and injected with 68mL of lidocaine with epi. Next it was swabbed with betadine and sterile gloves put on an 11 blade scalpel used to reopen the old incision.  The nexplanon was easily brought to the surface, the capsule scrapped off and it was removed intact from the patient. The area was re-prepped with betadine and an additional 75mL of lidocaine with epi injected.  Using sterile technique the Nexplanon device was inserted per manufacturer's guidelines in the subdermal connective tissue using the standard insertion technique without difficulty. Pressure was applied and the insertion site was hemostatic. The presence of the Nexplanon was confirmed immediately after insertion by palpation by both me and the patient and by checking the tip of needle for the absence of the insert.  A pressure dressing was applied.  The patient tolerated the procedure well.  Durene Romans MD Attending Center for [employer] Fish farm manager)

## 2017-01-15 LAB — CYTOLOGY - PAP

## 2017-01-24 ENCOUNTER — Emergency Department (HOSPITAL_COMMUNITY)
Admission: EM | Admit: 2017-01-24 | Discharge: 2017-01-25 | Disposition: A | Discharge status: Other Acute Inpatient Hospital | Payer: Medicaid Other | Attending: Emergency Medicine | Admitting: Emergency Medicine

## 2017-01-24 ENCOUNTER — Encounter (HOSPITAL_COMMUNITY): Payer: Self-pay

## 2017-01-24 DIAGNOSIS — I10 Essential (primary) hypertension: Secondary | ICD-10-CM | POA: Insufficient documentation

## 2017-01-24 DIAGNOSIS — E119 Type 2 diabetes mellitus without complications: Secondary | ICD-10-CM | POA: Insufficient documentation

## 2017-01-24 DIAGNOSIS — F17210 Nicotine dependence, cigarettes, uncomplicated: Secondary | ICD-10-CM | POA: Diagnosis not present

## 2017-01-24 DIAGNOSIS — F3289 Other specified depressive episodes: Secondary | ICD-10-CM | POA: Diagnosis not present

## 2017-01-24 DIAGNOSIS — Z7984 Long term (current) use of oral hypoglycemic drugs: Secondary | ICD-10-CM | POA: Diagnosis not present

## 2017-01-24 DIAGNOSIS — R45851 Suicidal ideations: Secondary | ICD-10-CM | POA: Insufficient documentation

## 2017-01-24 LAB — COMPREHENSIVE METABOLIC PANEL
ALK PHOS: 40 U/L (ref 38–126)
ALT: 25 U/L (ref 14–54)
AST: 29 U/L (ref 15–41)
Albumin: 4.5 g/dL (ref 3.5–5.0)
Anion gap: 10 (ref 5–15)
BILIRUBIN TOTAL: 1.1 mg/dL (ref 0.3–1.2)
BUN: 11 mg/dL (ref 6–20)
CALCIUM: 9.8 mg/dL (ref 8.9–10.3)
CO2: 22 mmol/L (ref 22–32)
CREATININE: 0.85 mg/dL (ref 0.44–1.00)
Chloride: 105 mmol/L (ref 101–111)
GFR calc Af Amer: >60 mL/min (ref >60)
Glucose, Bld: 227 mg/dL — ABNORMAL HIGH (ref 65–99)
Potassium: 4.1 mmol/L (ref 3.5–5.1)
Sodium: 137 mmol/L (ref 135–145)
TOTAL PROTEIN: 7.2 g/dL (ref 6.5–8.1)

## 2017-01-24 LAB — CBC
HCT: 39.6 % (ref 36.0–46.0)
Hemoglobin: 12.8 g/dL (ref 12.0–15.0)
MCH: 26.1 pg (ref 26.0–34.0)
MCHC: 32.3 g/dL (ref 30.0–36.0)
MCV: 80.7 fL (ref 78.0–100.0)
PLATELETS: 210 10*3/uL (ref 150–400)
RBC: 4.91 MIL/uL (ref 3.87–5.11)
RDW: 14.6 % (ref 11.5–15.5)
WBC: 9.9 10*3/uL (ref 4.0–10.5)

## 2017-01-24 LAB — RAPID URINE DRUG SCREEN, HOSP PERFORMED
AMPHETAMINES: NOT DETECTED
Barbiturates: NOT DETECTED
Benzodiazepines: NOT DETECTED
Cocaine: NOT DETECTED
Opiates: NOT DETECTED
Tetrahydrocannabinol: NOT DETECTED

## 2017-01-24 LAB — SALICYLATE LEVEL: Salicylate Lvl: <7 mg/dL (ref 2.8–30.0)

## 2017-01-24 LAB — I-STAT BETA HCG BLOOD, ED (MC, WL, AP ONLY)

## 2017-01-24 LAB — ETHANOL

## 2017-01-24 LAB — ACETAMINOPHEN LEVEL: Acetaminophen (Tylenol), Serum: <10 ug/mL — ABNORMAL LOW (ref 10–30)

## 2017-01-24 MED ORDER — BUSPIRONE HCL 10 MG PO TABS
15.0000 mg | ORAL_TABLET | Freq: Two times a day (BID) | ORAL | Status: DC
  Administered 2017-01-25: 15 mg via ORAL
  Filled 2017-01-24: qty 2

## 2017-01-24 MED ORDER — METFORMIN HCL 500 MG PO TABS
500.0000 mg | ORAL_TABLET | Freq: Two times a day (BID) | ORAL | Status: DC
  Administered 2017-01-25: 500 mg via ORAL
  Filled 2017-01-24: qty 1

## 2017-01-24 MED ORDER — GLIPIZIDE ER 10 MG PO TB24
10.0000 mg | ORAL_TABLET | Freq: Every day | ORAL | Status: DC
  Filled 2017-01-24: qty 1

## 2017-01-24 MED ORDER — DULOXETINE HCL 60 MG PO CPEP
60.0000 mg | ORAL_CAPSULE | Freq: Every day | ORAL | Status: DC
  Administered 2017-01-25: 60 mg via ORAL
  Filled 2017-01-24: qty 1

## 2017-01-24 MED ORDER — FLUVOXAMINE MALEATE 100 MG PO TABS
100.0000 mg | ORAL_TABLET | Freq: Every day | ORAL | Status: DC
  Administered 2017-01-24: 100 mg via ORAL
  Filled 2017-01-24 (×2): qty 1

## 2017-01-24 NOTE — ED Provider Notes (Signed)
[facility] DEPT Provider Note   CSN: [id number] Arrival date & time: 01/24/17  1837     History   Chief Complaint Chief Complaint  Patient presents with  . Suicidal    HPI [name] is a 26 y.o. female.  Patient was significant depression, bipolar, diabetes history presents with worsening depressive symptoms and suicidal ideation. Patient is a plan to take all her diabetic medications. Patient is on oral diabetic medications. Patient's had inpatient and treatment for the past however this is more significant.      Past Medical History:  Diagnosis Date  . Anxiety   . Bipolar 1 disorder ([place])   . Cognitive deficits   . Depression   . Diabetes mellitus without complication ([place])   . Hypertension   . Mental disorder   . Obesity     Patient Active Problem List   Diagnosis Date Noted  . OCD (obsessive compulsive disorder) 10/05/2016  . Major depressive disorder, recurrent episode, mild ([place]) 05/04/2016  . 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 Learning disability 07/18/2015  . 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 Diabetes mellitus ([place]) 07/18/2015  . 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 Hyperlipidemia 07/18/2015  . Severe episode of recurrent major depressive disorder, without psychotic features ([place])   . Drug overdose   . Cognitive deficits 10/12/2012  . Generalized anxiety disorder 06/28/2012    Past Surgical History:  Procedure Laterality Date  . CESAREAN SECTION    . CESAREAN SECTION N/A 04/25/2013   Procedure: REPEAT CESAREAN SECTION;  Surgeon: [name], MD;  Location: [facility];  Service: Obstetrics;  Laterality: N/A;  . MASS EXCISION N/A 06/03/2012   Procedure: EXCISION MASS;  Surgeon: [name], MD;  Location: [place];  Service: ENT;  Laterality: N/A;  Excision uvula mass  . TONSILLECTOMY N/A 06/03/2012   Procedure: TONSILLECTOMY;  Surgeon: [name], MD;   Location: [place];  Service: ENT;  Laterality: N/A;  . TONSILLECTOMY      OB History    Gravida Para Term Preterm AB Living   3 3 3  0 0 3   SAB TAB Ectopic Multiple Live Births   0 0 0 0 3       Home Medications    Prior to Admission medications   Medication Sig Start Date End Date Taking? Authorizing Provider  busPIRone (BUSPAR) 15 MG tablet Take 1 tablet (15 mg total) by mouth 2 (two) times daily. For anxiety 10/06/16  Yes Lindell Spar I, NP  DULoxetine (CYMBALTA) 60 MG capsule Take 1 capsule (60 mg total) by mouth daily. For depression 10/07/16  Yes Nwoko, Herbert Pun I, NP  fluvoxaMINE (LUVOX) 100 MG tablet Take 100 mg by mouth at bedtime.   Yes [provider]  glipiZIDE (GLUCOTROL XL) 10 MG 24 hr tablet Take 1 tablet (10 mg total) by mouth every evening. For diabetes management 10/06/16  Yes Lindell Spar I, NP  metFORMIN (GLUCOPHAGE) 500 MG tablet Take 1 tablet (500 mg total) by mouth 2 (two) times daily with a meal. For diabetes management 10/06/16  Yes Lindell Spar I, NP  omeprazole (PRILOSEC) 20 MG capsule Take 1 capsule (20 mg total) by mouth daily. For acid reflux Patient taking differently: Take 20 mg by mouth daily as needed. For acid reflux 10/06/16  Yes Encarnacion Slates, NP  UNKNOWN TO PATIENT NERVE PILL BLADDER PILL   Yes [provider]    Family History Family History  Problem Relation Age of Onset  . Hypertension Mother   . Diabetes Father     Social History Social History  Substance Use Topics  . Smoking status: Current Every Day Smoker    Packs/day: 1.00    Years: 7.00    Types: Cigarettes  . Smokeless tobacco: Never Used  . Alcohol use No     Allergies   Wellbutrin [bupropion]; Omnipaque [iohexol]; and Penicillins   Review of Systems Review of Systems   Physical Exam Updated Vital Signs BP (!) 137/93 (BP Location: Right Arm)   Pulse 66   Temp 98.4 F (36.9 C) (Oral)   Resp 16   SpO2 99%   Physical Exam    Constitutional: She appears well-developed and well-nourished. No distress.  HENT:  Head: Normocephalic and atraumatic.  Eyes: Conjunctivae are normal.  Neck: Neck supple.  Cardiovascular: Normal rate and regular rhythm.   No murmur heard. Pulmonary/Chest: Effort normal and breath sounds normal. No respiratory distress.  Abdominal: Soft. There is no tenderness.  Musculoskeletal: She exhibits no edema.  Neurological: She is alert.  Skin: Skin is warm and dry.  Psychiatric: Her affect is blunt. Cognition and memory are impaired. She expresses suicidal ideation. She expresses suicidal plans.  Nursing note and vitals reviewed.    ED Treatments / Results  Labs (all labs ordered are listed, but only abnormal results are displayed) Labs Reviewed  COMPREHENSIVE METABOLIC PANEL - Abnormal; Notable for the following:       Result Value   Glucose, Bld 227 (*)    All other components within normal limits  ACETAMINOPHEN LEVEL - Abnormal; Notable for the following:    Acetaminophen (Tylenol), Serum <10 (*)    All other components within normal limits  ETHANOL  SALICYLATE LEVEL  CBC  RAPID URINE DRUG SCREEN, HOSP PERFORMED  I-STAT BETA HCG BLOOD, ED ([facility], [facility], [facility] ONLY)    EKG  EKG Interpretation None       Radiology No results found.  Procedures Procedures (including critical care time)  Medications Ordered in ED Medications  fluvoxaMINE (LUVOX) tablet 100 mg (not administered)  DULoxetine (CYMBALTA) DR capsule 60 mg (not administered)  busPIRone (BUSPAR) tablet 15 mg (not administered)  glipiZIDE (GLUCOTROL XL) 24 hr tablet 10 mg (not administered)  metFORMIN (GLUCOPHAGE) tablet 500 mg (not administered)     Initial Impression / Assessment and Plan / ED Course  I have reviewed the triage vital signs and the nursing notes.  Pertinent labs & imaging results that were available during my care of the patient were reviewed by me and considered in my medical decision making  (see chart for details).    Patient presents for worsening suicidal ideation with history of admission. Patient stable and medically clear on exam. Reordered medicines. Behavioral Health assessing recommended inpatient admission. Placement pending.  The patients results and plan were reviewed and discussed.   Any x-rays performed were independently reviewed by myself.   Differential diagnosis were considered with the presenting HPI.  Medications  fluvoxaMINE (LUVOX) tablet 100 mg (not administered)  DULoxetine (CYMBALTA) DR capsule 60 mg (not administered)  busPIRone (BUSPAR) tablet 15 mg (not administered)  glipiZIDE (GLUCOTROL XL) 24 hr tablet 10 mg (not administered)  metFORMIN (GLUCOPHAGE) tablet 500 mg (not administered)    Vitals:   01/24/17 2033 01/24/17 2133  BP: (!) 163/97 (!) 137/93  Pulse: (!) 110 66  Resp: 18 16  Temp: 98.4 F (36.9 C)   TempSrc: Oral   SpO2: 100% 99%  Final diagnoses:  Suicidal ideation    Admission/ observation were discussed with the admitting physician, patient and/or family and they are comfortable with the plan.    Final Clinical Impressions(s) / ED Diagnoses   Final diagnoses:  Suicidal ideation    New Prescriptions New Prescriptions   No medications on file     Elnora Morrison, MD 01/24/17 2326

## 2017-01-24 NOTE — ED Notes (Signed)
Called security to wand patient

## 2017-01-24 NOTE — BH Assessment (Addendum)
Tele Assessment Note   Patient Name: [name] MRN: [id number] Referring Physician: Elnora Morrison, MD Location of Patient: [name] Location of Provider: [place] is an 26 y.o. single female who presents unaccompanied to [name] reporting symptoms of depression including suicidal ideation. Pt has a long history of depression and states her symptoms have been severe for the past three days. Pt reports symptoms including crying spells, social withdrawal, fatigue, irritability, decreased concentration, increased sleep and feelings of worthlessness and hopelessness. Pt says she spends all her time in bed. Pt reports she feels anxious which resulted in her decision to shave her head. She reports current suicidal ideation with plan to overdose on prescription medications. Pt reports she has attempted suicide four times in the past by overdose. Pt denies any history of intentional self-injurious behavior. Pt denies current homicidal ideation or history of violence. Pt denies any history of psychotic symptoms. Pt denies history of alcohol or substance use; Pt's blood alcohol level and urine drug screen are negative.   Pt identifies inability to regulate her blood sugar as her primary stressor. She says she is physically "feeling bad." She lives with her mother and sister and identifies her mother as her primary support. She states she is on disability. She denies any history of abuse or trauma. She denies any legal problems. She denies access to firearms.  Pt reports she is currently receiving outpatient medication management with Dr. Rodney Langton at [facility]. She says she is taking all her medications as prescribed. She says she has an appointment with a therapist 01/29/17. Pt has received inpatient psychiatric treatment several times at [place] and [facility]. Pt reports her most recent psychiatric admission was at [facility] in June 2018.  Pt is  dressed in hospital scrubs, alert and oriented x4. Pt speaks in a clear tone, at low volume and slow pace. Motor behavior appears normal. Eye contact is fair. Pt's mood is depressed and affect is congruent with mood. Thought process is coherent and relevant. There is no indication Pt is currently responding to internal stimuli or experiencing delusional thought content. Pt was pleasant and cooperative throughout assessment. She says she is willing to sign voluntarily into a psychiatric facility. .   Diagnosis: Major Depressive Disorder, Recurrent, Severe Without Psychotic Features  Past Medical History:  Past Medical History:  Diagnosis Date  . Anxiety   . Bipolar 1 disorder ([place])   . Cognitive deficits   . Depression   . Diabetes mellitus without complication ([place])   . Hypertension   . Mental disorder   . Obesity     Past Surgical History:  Procedure Laterality Date  . CESAREAN SECTION    . CESAREAN SECTION N/A 04/25/2013   Procedure: REPEAT CESAREAN SECTION;  Surgeon: [name], MD;  Location: [facility];  Service: Obstetrics;  Laterality: N/A;  . MASS EXCISION N/A 06/03/2012   Procedure: EXCISION MASS;  Surgeon: [name], MD;  Location: [place];  Service: ENT;  Laterality: N/A;  Excision uvula mass  . TONSILLECTOMY N/A 06/03/2012   Procedure: TONSILLECTOMY;  Surgeon: [name], MD;  Location: [place];  Service: ENT;  Laterality: N/A;  . TONSILLECTOMY      Family History:  Family History  Problem Relation Age of Onset  . Hypertension Mother   . Diabetes Father     Social History:  reports that she has been smoking Cigarettes.  She has  a 7.00 pack-year smoking history. She has never used smokeless tobacco. She reports that she does not drink alcohol or use drugs.  Additional Social History:  Alcohol / Drug Use Pain Medications: See MAR Prescriptions: See MAR Over the Counter: See MAR History of alcohol / drug use?: No history  of alcohol / drug abuse Longest period of sobriety (when/how long): N/A  CIWA: CIWA-Ar BP: (!) 137/93 Pulse Rate: 66 COWS:    PATIENT STRENGTHS: (choose at least two) Ability for insight Average or above average intelligence Communication skills General fund of knowledge Motivation for treatment/growth Supportive family/friends  Allergies:  Allergies  Allergen Reactions  . Wellbutrin [Bupropion] Shortness Of Breath  . Omnipaque [Iohexol] Swelling and Other (See Comments)    Reaction:  Eye swelling  . Penicillins Hives and Other (See Comments)    Has patient had a PCN reaction causing immediate rash, facial/tongue/throat swelling, SOB or lightheadedness with hypotension: No Has patient had a PCN reaction causing severe rash involving mucus membranes or skin necrosis: No Has patient had a PCN reaction that required hospitalization No Has patient had a PCN reaction occurring within the last 10 years: No If all of the above answers are "NO", then may proceed with Cephalosporin use.    Home Medications:  (Not in a hospital admission)  OB/GYN Status:  No LMP recorded. Patient has had an implant.  General Assessment Data Location of Assessment: [facility] ED TTS Assessment: In system Is this a Tele or Face-to-Face Assessment?: Tele Assessment Is this an Initial Assessment or a Re-assessment for this encounter?: Initial Assessment Marital status: Single Maiden name: Sparrow Is patient pregnant?: No Pregnancy Status: No Living Arrangements: Parent, Other relatives (Pt. reports living with her mother and sister) Can pt return to current living arrangement?: Yes Admission Status: Voluntary Is patient capable of signing voluntary admission?: Yes Referral Source: Self/Family/Friend Insurance type: Medicaid     Crisis Care Plan Living Arrangements: Parent, Other relatives (Pt. reports living with her mother and sister) Legal Guardian: Other: (Self) Name of Psychiatrist: Dr. Rodney Langton at  [facility] Name of Therapist: [name]  Education Status Is patient currently in school?: No Current Grade: NA Highest grade of school patient has completed: 62 Name of school: NA Contact person: NA  Risk to self with the past 6 months Suicidal Ideation: Yes-Currently Present Has patient been a risk to self within the past 6 months prior to admission? : Yes Suicidal Intent: Yes-Currently Present Has patient had any suicidal intent within the past 6 months prior to admission? : Yes Is patient at risk for suicide?: Yes Suicidal Plan?: Yes-Currently Present Has patient had any suicidal plan within the past 6 months prior to admission? : Yes Specify Current Suicidal Plan: Patient reports having a plan to overdose with her prescribed pills. Access to Means: Yes Specify Access to Suicidal Means: Patient reports having access to pills. What has been your use of drugs/alcohol within the last 12 months?: Pt denies Previous Attempts/Gestures: Yes How many times?: 4 Other Self Harm Risks: Pt denies Triggers for Past Attempts: Other personal contacts, Family contact Intentional Self Injurious Behavior: None Comment - Self Injurious Behavior: Pt denies Family Suicide History: No Recent stressful life event(s): Other (Comment) (Physical discomfort, high blood sugar) Persecutory voices/beliefs?: No Depression: Yes Depression Symptoms: Despondent, Tearfulness, Isolating, Fatigue, Feeling angry/irritable, Feeling worthless/self pity Substance abuse history and/or treatment for substance abuse?: No Suicide prevention information given to non-admitted patients: Not applicable  Risk to Others within the past 6 months Homicidal Ideation:  No Does patient have any lifetime risk of violence toward others beyond the six months prior to admission? : No Thoughts of Harm to Others: No Current Homicidal Intent: No Current Homicidal Plan: No Access to Homicidal Means: No Identified Victim: None History of  harm to others?: No Assessment of Violence: None Noted Violent Behavior Description: Pt denies Does patient have access to weapons?: No Criminal Charges Pending?: No Does patient have a court date: No Is patient on probation?: No  Psychosis Hallucinations: None noted Delusions: None noted  Mental Status Report Appearance/Hygiene: In scrubs Eye Contact: Fair Motor Activity: Unremarkable Speech: Logical/coherent, Slow Level of Consciousness: Alert Mood: Depressed Affect: Depressed Anxiety Level: Minimal Thought Processes: Coherent, Relevant Judgement: Partial Orientation: Person, Place, Time, Situation Obsessive Compulsive Thoughts/Behaviors: Minimal  Cognitive Functioning Concentration: Normal Memory: Recent Intact, Remote Intact IQ: Average Insight: Poor Impulse Control: Fair Appetite: Good Weight Loss: 0 Weight Gain: 0 Sleep: Increased Total Hours of Sleep: 10 Vegetative Symptoms: Staying in bed  ADLScreening [facility] Assessment Services) Patient's cognitive ability adequate to safely complete daily activities?: Yes Patient able to express need for assistance with ADLs?: Yes Independently performs ADLs?: Yes (appropriate for developmental age)  Prior Inpatient Therapy Prior Inpatient Therapy: Yes Prior Therapy Dates: 2016, 2017, 2018 Prior Therapy Facilty/Provider(s): [facility] [place] Reason for Treatment: Depression  Prior Outpatient Therapy Prior Outpatient Therapy: Yes Prior Therapy Dates: Current Prior Therapy Facilty/Provider(s): [facility] Reason for Treatment: Depression Does patient have an ACCT team?: No Does patient have Intensive In-House Services?  : No Does patient have [facility] services? : Yes Does patient have P4CC services?: No  ADL Screening (condition at time of admission) Patient's cognitive ability adequate to safely complete daily activities?: Yes Is the patient deaf or have difficulty hearing?: No Does the patient have  difficulty seeing, even when wearing glasses/contacts?: No Does the patient have difficulty concentrating, remembering, or making decisions?: No Patient able to express need for assistance with ADLs?: Yes Does the patient have difficulty dressing or bathing?: No Independently performs ADLs?: Yes (appropriate for developmental age) Does the patient have difficulty walking or climbing stairs?: No Weakness of Legs: None Weakness of Arms/Hands: None  Home Assistive Devices/Equipment Home Assistive Devices/Equipment: None    Abuse/Neglect Assessment (Assessment to be complete while patient is alone) Physical Abuse: Denies Verbal Abuse: Denies Sexual Abuse: Denies Exploitation of patient/patient's resources: Denies Self-Neglect: Denies     Regulatory affairs officer (For Healthcare) Does Patient Have a Medical Advance Directive?: No Would patient like information on creating a medical advance directive?: No - Patient declined    Additional Information 1:1 In Past 12 Months?: No CIRT Risk: No Elopement Risk: No Does patient have medical clearance?: Yes     Disposition: Inocencio Homes, AC at [facility], confirmed adult unit is at capacity. Gave clinical report to Lindon Romp, NP who said Pt meets criteria for inpatient psychiatric treatment. TTS will contact facilities for placement. Notified Elnora Morrison, MD and Nolon Lennert, RN of recommendation.  Disposition Initial Assessment Completed for this Encounter: Yes Disposition of Patient: Inpatient treatment program Type of inpatient treatment program: Adult  This service was provided via telemedicine using a 2-way, interactive audio and video technology.  Names of all persons participating in this telemedicine service and their role in this encounter. Name: [name] Role: Patient             Orpah Greek Anson Fret, [facility], [facility], [facility] Triage Specialist [phone]   Evelena Peat 01/24/2017 10:17 PM

## 2017-01-24 NOTE — ED Notes (Signed)
Called staffing regarding SI sitter need.

## 2017-01-24 NOTE — ED Triage Notes (Signed)
Pt presents for evaluation of SI with plan to overdose x 2 days. Reports DM is "out of control" and she is tired of her life.

## 2017-01-24 NOTE — ED Notes (Signed)
TTS video conference interview in progress.

## 2017-01-24 NOTE — ED Notes (Signed)
Pt belongings placed in [place]

## 2017-01-25 LAB — CBG MONITORING, ED
Glucose-Capillary: 336 mg/dL — ABNORMAL HIGH (ref 65–99)
Glucose-Capillary: 274 mg/dL — ABNORMAL HIGH (ref 65–99)

## 2017-01-25 MED ORDER — INSULIN ASPART 100 UNIT/ML ~~LOC~~ SOLN
0.0000 [IU] | Freq: Three times a day (TID) | SUBCUTANEOUS | Status: DC
  Administered 2017-01-25: 7 [IU] via SUBCUTANEOUS
  Administered 2017-01-25: 5 [IU] via SUBCUTANEOUS
  Filled 2017-01-25 (×2): qty 1

## 2017-01-25 NOTE — Progress Notes (Signed)
Patient accepted to [name] for inpatient treatment.   Accepting/attending physician is Dr. Dareen Piano.  Number for report is [phone].  Patient can arrive at 2:00pm.   Izora Gala, RN notiifed   Radonna Ricker MSW, [place] Disposition [phone]

## 2017-01-25 NOTE — ED Notes (Signed)
Called [facility] to transport to [facility].

## 2017-01-25 NOTE — ED Notes (Signed)
Dinner tray ordered for patient.

## 2017-01-25 NOTE — ED Notes (Signed)
Pt has been accepted to [facility]. May go by [facility] at 2pm.

## 2017-01-25 NOTE — ED Notes (Signed)
Called [facility] to follow up, driver on the way.

## 2017-01-25 NOTE — Progress Notes (Signed)
Patient meets criteria for inpatient treatment. CSW faxed referrals to the following inpatient facilities for review:  [facility], [facility], [facility] [address] [facility], [facility], [facility]    TTS will continue to seek bed placement.   Radonna Ricker MSW, [place] Disposition [phone]

## 2017-02-01 ENCOUNTER — Telehealth: Payer: Self-pay | Admitting: Obstetrics and Gynecology

## 2017-02-01 ENCOUNTER — Encounter: Payer: Self-pay | Admitting: Obstetrics and Gynecology

## 2017-02-01 DIAGNOSIS — N879 Dysplasia of cervix uteri, unspecified: Secondary | ICD-10-CM | POA: Insufficient documentation

## 2017-02-01 NOTE — Telephone Encounter (Signed)
GYN Telephone Note Patient called at [phone] to inform of abnormal pap. Generic VM picked up and VM left for patient to call clinic. Will try again later  Durene Romans MD Attending [facility] [place] (Faculty Practice) 02/01/2017 Time: [phone]

## 2017-02-07 ENCOUNTER — Encounter (HOSPITAL_COMMUNITY): Payer: Self-pay | Admitting: Emergency Medicine

## 2017-02-07 DIAGNOSIS — F17210 Nicotine dependence, cigarettes, uncomplicated: Secondary | ICD-10-CM | POA: Diagnosis not present

## 2017-02-07 DIAGNOSIS — R1110 Vomiting, unspecified: Secondary | ICD-10-CM | POA: Diagnosis not present

## 2017-02-07 DIAGNOSIS — E119 Type 2 diabetes mellitus without complications: Secondary | ICD-10-CM | POA: Insufficient documentation

## 2017-02-07 DIAGNOSIS — R1033 Periumbilical pain: Secondary | ICD-10-CM | POA: Insufficient documentation

## 2017-02-07 DIAGNOSIS — Z7984 Long term (current) use of oral hypoglycemic drugs: Secondary | ICD-10-CM | POA: Insufficient documentation

## 2017-02-07 DIAGNOSIS — R197 Diarrhea, unspecified: Secondary | ICD-10-CM | POA: Insufficient documentation

## 2017-02-07 DIAGNOSIS — R109 Unspecified abdominal pain: Secondary | ICD-10-CM | POA: Diagnosis present

## 2017-02-07 DIAGNOSIS — I10 Essential (primary) hypertension: Secondary | ICD-10-CM | POA: Diagnosis not present

## 2017-02-07 DIAGNOSIS — Z79899 Other long term (current) drug therapy: Secondary | ICD-10-CM | POA: Insufficient documentation

## 2017-02-07 MED ORDER — ONDANSETRON 4 MG PO TBDP: 4.0000 mg | ORAL_TABLET | Freq: Once | ORAL | Status: DC | PRN

## 2017-02-07 NOTE — ED Triage Notes (Signed)
Pt states she has been feeling weak and nauseated.

## 2017-02-07 NOTE — ED Triage Notes (Signed)
Pt presents by [facility] for evaluation of abd pain along with diarrhea for the last 4 days.

## 2017-02-08 ENCOUNTER — Emergency Department (HOSPITAL_COMMUNITY)
Admission: EM | Admit: 2017-02-08 | Discharge: 2017-02-08 | Disposition: A | Discharge status: Home / Self Care | Payer: Medicaid Other | Attending: Emergency Medicine | Admitting: Emergency Medicine

## 2017-02-08 ENCOUNTER — Ambulatory Visit (HOSPITAL_COMMUNITY): Admission: RE | Admit: 2017-02-08 | Payer: Medicaid Other | Source: Ambulatory Visit

## 2017-02-08 DIAGNOSIS — R1033 Periumbilical pain: Secondary | ICD-10-CM

## 2017-02-08 LAB — COMPREHENSIVE METABOLIC PANEL
ALT: 27 U/L (ref 14–54)
AST: 25 U/L (ref 15–41)
Albumin: 4.4 g/dL (ref 3.5–5.0)
Alkaline Phosphatase: 46 U/L (ref 38–126)
Anion gap: 9 (ref 5–15)
BUN: 14 mg/dL (ref 6–20)
CHLORIDE: 105 mmol/L (ref 101–111)
CO2: 23 mmol/L (ref 22–32)
CREATININE: 0.85 mg/dL (ref 0.44–1.00)
Calcium: 9.7 mg/dL (ref 8.9–10.3)
GFR calc Af Amer: >60 mL/min (ref >60)
Glucose, Bld: 320 mg/dL — ABNORMAL HIGH (ref 65–99)
Potassium: 3.4 mmol/L — ABNORMAL LOW (ref 3.5–5.1)
Sodium: 137 mmol/L (ref 135–145)
Total Bilirubin: 0.4 mg/dL (ref 0.3–1.2)
Total Protein: 7.7 g/dL (ref 6.5–8.1)

## 2017-02-08 LAB — URINALYSIS, ROUTINE W REFLEX MICROSCOPIC
Bacteria, UA: NONE SEEN
Bilirubin Urine: NEGATIVE
Glucose, UA: >=500 mg/dL — AB
Ketones, ur: 5 mg/dL — AB
LEUKOCYTES UA: NEGATIVE
Nitrite: NEGATIVE
Protein, ur: 100 mg/dL — AB
SPECIFIC GRAVITY, URINE: 1.044 — AB (ref 1.005–1.030)
pH: 5 (ref 5.0–8.0)

## 2017-02-08 LAB — CBC
HCT: 37.9 % (ref 36.0–46.0)
Hemoglobin: 12.6 g/dL (ref 12.0–15.0)
MCH: 26.5 pg (ref 26.0–34.0)
MCHC: 33.2 g/dL (ref 30.0–36.0)
MCV: 79.8 fL (ref 78.0–100.0)
PLATELETS: 226 10*3/uL (ref 150–400)
RBC: 4.75 MIL/uL (ref 3.87–5.11)
RDW: 14.3 % (ref 11.5–15.5)
WBC: 12 10*3/uL — AB (ref 4.0–10.5)

## 2017-02-08 LAB — PREGNANCY, URINE: PREG TEST UR: NEGATIVE

## 2017-02-08 LAB — LIPASE, BLOOD: LIPASE: 31 U/L (ref 11–51)

## 2017-02-08 MED ORDER — INSULIN ASPART 100 UNIT/ML ~~LOC~~ SOLN
8.0000 [IU] | Freq: Once | SUBCUTANEOUS | Status: AC
  Administered 2017-02-08: 8 [IU] via INTRAVENOUS
  Filled 2017-02-08: qty 1

## 2017-02-08 MED ORDER — SODIUM CHLORIDE 0.9 % IV BOLUS (SEPSIS)
1000.0000 mL | Freq: Once | INTRAVENOUS | Status: AC
  Administered 2017-02-08: 1000 mL via INTRAVENOUS

## 2017-02-08 MED ORDER — ONDANSETRON HCL 4 MG/2ML IJ SOLN
4.0000 mg | Freq: Once | INTRAMUSCULAR | Status: AC
  Administered 2017-02-08: 4 mg via INTRAVENOUS
  Filled 2017-02-08: qty 2

## 2017-02-08 MED ORDER — KETOROLAC TROMETHAMINE 30 MG/ML IJ SOLN
30.0000 mg | Freq: Once | INTRAMUSCULAR | Status: AC
  Administered 2017-02-08: 30 mg via INTRAVENOUS
  Filled 2017-02-08: qty 1

## 2017-02-08 NOTE — ED Notes (Signed)
Pt refused twice to drink barium for CT scan. Explained to pt the importance of the scan and that without the scan, it is more difficult for [place] to be able to help her. Pt stated " I'm not drinking that stuff, it's nasty. I'll just follow up with my doctor tomorrow."

## 2017-02-08 NOTE — ED Provider Notes (Signed)
[facility] DEPT Provider Note   CSN: [id number] Arrival date & time: 02/07/17  2236     History   Chief Complaint Chief Complaint  Patient presents with  . Abdominal Pain    HPI [name] is a 26 y.o. female.  Patient is a 26 year old female with past medical history of bipolar disorder, diabetes, hypertension presenting for evaluation of abdominal pain. This started yesterday and is worsening. Her pain is in the periumbilical and suprapubic region and is described as a cramping. She denies any fevers or chills. She does report vomiting and diarrhea. All has been nonbloody. Her last menstrual period is current. She does report starting a birth control implant approximately 3 weeks ago. She denies any vaginal discharge.   The history is provided by the patient.  Abdominal Pain   This is a new problem. The current episode started yesterday. The problem occurs constantly. The problem has been rapidly worsening. The pain is located in the suprapubic region and periumbilical region. The pain is moderate. Pertinent negatives include fever, hematochezia, constipation and dysuria. Nothing aggravates the symptoms. Nothing relieves the symptoms.    Past Medical History:  Diagnosis Date  . Anxiety   . Bipolar 1 disorder ([place])   . Cognitive deficits   . Depression   . Diabetes mellitus without complication ([place])   . Hypertension   . Mental disorder   . Obesity     Patient Active Problem List   Diagnosis Date Noted  . LGSIL on Pap smear of cervix 02/01/2017  . OCD (obsessive compulsive disorder) 10/05/2016  . Major depressive disorder, recurrent episode, mild ([place]) 05/04/2016  . 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 Learning disability 07/18/2015  . 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 Diabetes mellitus ([place]) 07/18/2015  . MDD (major depressive disorder), recurrent, severe, with psychosis ([place]) 07/18/2015  . Hyperlipidemia 07/18/2015  . Severe episode of  recurrent major depressive disorder, without psychotic features ([place])   . Drug overdose   . Cognitive deficits 10/12/2012  . Generalized anxiety disorder 06/28/2012    Past Surgical History:  Procedure Laterality Date  . CESAREAN SECTION    . CESAREAN SECTION N/A 04/25/2013   Procedure: REPEAT CESAREAN SECTION;  Surgeon: [name], MD;  Location: [facility];  Service: Obstetrics;  Laterality: N/A;  . MASS EXCISION N/A 06/03/2012   Procedure: EXCISION MASS;  Surgeon: [name], MD;  Location: [place];  Service: ENT;  Laterality: N/A;  Excision uvula mass  . TONSILLECTOMY N/A 06/03/2012   Procedure: TONSILLECTOMY;  Surgeon: [name], MD;  Location: [place];  Service: ENT;  Laterality: N/A;  . TONSILLECTOMY      OB History    Gravida Para Term Preterm AB Living   3 3 3  0 0 3   SAB TAB Ectopic Multiple Live Births   0 0 0 0 3       Home Medications    Prior to Admission medications   Medication Sig Start Date End Date Taking? Authorizing Provider  busPIRone (BUSPAR) 15 MG tablet Take 1 tablet (15 mg total) by mouth 2 (two) times daily. For anxiety Patient taking differently: Take 15 mg by mouth 3 (three) times daily. For anxiety 10/06/16   Lindell Spar I, NP  clomiPHENE (CLOMID) 50 MG tablet Take 100 mg by mouth at bedtime.    [provider]  DULoxetine (CYMBALTA) 20 MG capsule Take 40 mg by mouth daily.    [provider]  fluvoxaMINE (LUVOX)  100 MG tablet Take 100 mg by mouth 2 (two) times daily.     [provider]  glipiZIDE (GLUCOTROL XL) 10 MG 24 hr tablet Take 1 tablet (10 mg total) by mouth every evening. For diabetes management 10/06/16   Lindell Spar I, NP  metFORMIN (GLUCOPHAGE) 500 MG tablet Take 1 tablet (500 mg total) by mouth 2 (two) times daily with a meal. For diabetes management 10/06/16   Lindell Spar I, NP  omeprazole (PRILOSEC) 20 MG capsule Take 1 capsule (20 mg total) by mouth daily. For  acid reflux Patient taking differently: Take 20 mg by mouth daily as needed. For acid reflux 10/06/16   Lindell Spar I, NP  paliperidone (INVEGA SUSTENNA) 234 MG/1.5ML SUSP injection Inject 234 mg into the muscle once.    [provider]  solifenacin (VESICARE) 5 MG tablet Take 5 mg by mouth daily as needed (for bladder).    [provider]    Family History Family History  Problem Relation Age of Onset  . Hypertension Mother   . Diabetes Father     Social History Social History  Substance Use Topics  . Smoking status: Current Every Day Smoker    Packs/day: 1.00    Years: 7.00    Types: Cigarettes  . Smokeless tobacco: Never Used  . Alcohol use No     Allergies   Wellbutrin [bupropion]; Omnipaque [iohexol]; and Penicillins   Review of Systems Review of Systems  Constitutional: Negative for fever.  Gastrointestinal: Positive for abdominal pain. Negative for constipation and hematochezia.  Genitourinary: Negative for dysuria.  All other systems reviewed and are negative.    Physical Exam Updated Vital Signs BP 140/87 (BP Location: Left Arm)   Pulse (!) 102   Temp 99 F (37.2 C) (Oral)   Resp 20   Ht 5\' 7"  (1.702 m)   Wt 117.9 kg (260 lb)   SpO2 98%   BMI 40.72 kg/m   Physical Exam  Constitutional: She is oriented to person, place, and time. She appears well-developed and well-nourished. No distress.  HENT:  Head: Normocephalic and atraumatic.  Neck: Normal range of motion. Neck supple.  Cardiovascular: Normal rate and regular rhythm.  Exam reveals no gallop and no friction rub.   No murmur heard. Pulmonary/Chest: Effort normal and breath sounds normal. No respiratory distress. She has no wheezes.  Abdominal: Soft. Bowel sounds are normal. She exhibits no distension. There is tenderness. There is no rebound and no guarding.  There is tenderness to palpation in the periumbilical and suprapubic region.  Musculoskeletal: Normal range of motion.    Neurological: She is alert and oriented to person, place, and time.  Skin: Skin is warm and dry. She is not diaphoretic.  Nursing note and vitals reviewed.    ED Treatments / Results  Labs (all labs ordered are listed, but only abnormal results are displayed) Labs Reviewed  COMPREHENSIVE METABOLIC PANEL - Abnormal; Notable for the following:       Result Value   Potassium 3.4 (*)    Glucose, Bld 320 (*)    All other components within normal limits  CBC - Abnormal; Notable for the following:    WBC 12.0 (*)    All other components within normal limits  URINALYSIS, ROUTINE W REFLEX MICROSCOPIC - Abnormal; Notable for the following:    APPearance HAZY (*)    Specific Gravity, Urine 1.044 (*)    Glucose, UA >=500 (*)    Hgb urine dipstick LARGE (*)  Ketones, ur 5 (*)    Protein, ur 100 (*)    Squamous Epithelial / LPF 0-5 (*)    All other components within normal limits  LIPASE, BLOOD  PREGNANCY, URINE    EKG  EKG Interpretation None       Radiology No results found.  Procedures Procedures (including critical care time)  Medications Ordered in ED Medications  ondansetron (ZOFRAN-ODT) disintegrating tablet 4 mg (not administered)  ketorolac (TORADOL) 30 MG/ML injection 30 mg (not administered)  ondansetron (ZOFRAN) injection 4 mg (not administered)  sodium chloride 0.9 % bolus 1,000 mL (not administered)     Initial Impression / Assessment and Plan / ED Course  I have reviewed the triage vital signs and the nursing notes.  Pertinent labs & imaging results that were available during my care of the patient were reviewed by me and considered in my medical decision making (see chart for details).  Patient with lower abdominal pain and elevated WBC to 12,000. Her history is somewhat confusing and exam inconsistent. She also has a history of multiple visits to the ED.My plan was to obtain a CT scan, however the patient is now refusing this stating that she does not  want to drink the contrast and she feels better. She does not feel as though this study is necessary. I've discussed this with her. She will be discharged, to return as needed if she worsens.  Final Clinical Impressions(s) / ED Diagnoses   Final diagnoses:  None    New Prescriptions New Prescriptions   No medications on file     Veryl Speak, MD 02/08/17 [phone]

## 2017-02-08 NOTE — Discharge Instructions (Signed)
Ibuprofen 600 mg every 6 hours as needed for pain.  Return to the emergency department for completion of your workup if you develop worsening pain, high fevers, bloody stools, or other new and concerning symptoms.

## 2017-02-11 ENCOUNTER — Encounter (HOSPITAL_COMMUNITY): Payer: Self-pay

## 2017-02-11 ENCOUNTER — Emergency Department (HOSPITAL_COMMUNITY)
Admission: EM | Admit: 2017-02-11 | Discharge: 2017-02-11 | Disposition: A | Discharge status: Home / Self Care | Payer: Medicaid Other | Attending: Emergency Medicine | Admitting: Emergency Medicine

## 2017-02-11 DIAGNOSIS — Z5321 Procedure and treatment not carried out due to patient leaving prior to being seen by health care provider: Secondary | ICD-10-CM | POA: Diagnosis not present

## 2017-02-11 DIAGNOSIS — R109 Unspecified abdominal pain: Secondary | ICD-10-CM | POA: Insufficient documentation

## 2017-02-11 LAB — CBC
HCT: 38 % (ref 36.0–46.0)
HEMOGLOBIN: 12.9 g/dL (ref 12.0–15.0)
MCH: 26.9 pg (ref 26.0–34.0)
MCHC: 33.9 g/dL (ref 30.0–36.0)
MCV: 79.2 fL (ref 78.0–100.0)
PLATELETS: 229 10*3/uL (ref 150–400)
RBC: 4.8 MIL/uL (ref 3.87–5.11)
RDW: 14.2 % (ref 11.5–15.5)
WBC: 8.2 10*3/uL (ref 4.0–10.5)

## 2017-02-11 LAB — COMPREHENSIVE METABOLIC PANEL
ALK PHOS: 46 U/L (ref 38–126)
ALT: 26 U/L (ref 14–54)
ANION GAP: 12 (ref 5–15)
AST: 28 U/L (ref 15–41)
Albumin: 4.4 g/dL (ref 3.5–5.0)
BILIRUBIN TOTAL: 0.8 mg/dL (ref 0.3–1.2)
BUN: 11 mg/dL (ref 6–20)
CALCIUM: 9.2 mg/dL (ref 8.9–10.3)
CO2: 22 mmol/L (ref 22–32)
CREATININE: 0.78 mg/dL (ref 0.44–1.00)
Chloride: 104 mmol/L (ref 101–111)
Glucose, Bld: 301 mg/dL — ABNORMAL HIGH (ref 65–99)
Potassium: 3.4 mmol/L — ABNORMAL LOW (ref 3.5–5.1)
SODIUM: 138 mmol/L (ref 135–145)
TOTAL PROTEIN: 8.1 g/dL (ref 6.5–8.1)

## 2017-02-11 LAB — URINALYSIS, ROUTINE W REFLEX MICROSCOPIC
Bilirubin Urine: NEGATIVE
Ketones, ur: NEGATIVE mg/dL
Leukocytes, UA: NEGATIVE
NITRITE: NEGATIVE
PH: 6 (ref 5.0–8.0)
Protein, ur: 100 mg/dL — AB
SPECIFIC GRAVITY, URINE: 1.039 — AB (ref 1.005–1.030)

## 2017-02-11 LAB — I-STAT BETA HCG BLOOD, ED (MC, WL, AP ONLY): I-stat hCG, quantitative: <5 m[IU]/mL (ref <5)

## 2017-02-11 LAB — LIPASE, BLOOD: Lipase: 27 U/L (ref 11–51)

## 2017-02-11 LAB — CBG MONITORING, ED: Glucose-Capillary: 296 mg/dL — ABNORMAL HIGH (ref 65–99)

## 2017-02-11 NOTE — ED Notes (Signed)
Pt called from the lobby with no response 

## 2017-02-11 NOTE — ED Triage Notes (Signed)
Per EMS- Patient c/o generalized abdominal pain and vaginal bleeding x 3 weeks. Patient went to PCP yesterday and was ordered an [place]. Patient has a history of uterine fibroids. Patient states she was started on insulin yesterday and had her insulin last night. Patient also reports that she did not take her HTN med today.

## 2017-02-11 NOTE — ED Notes (Signed)
Called Pt in lobby to be roomed x2 no response.

## 2017-02-11 NOTE — ED Notes (Signed)
Called Pt in lobby X3 no response.

## 2017-02-12 ENCOUNTER — Encounter (HOSPITAL_COMMUNITY): Payer: Self-pay | Admitting: *Deleted

## 2017-02-12 ENCOUNTER — Inpatient Hospital Stay (HOSPITAL_COMMUNITY): Payer: Medicaid Other

## 2017-02-12 ENCOUNTER — Inpatient Hospital Stay (HOSPITAL_COMMUNITY)
Admission: AD | Admit: 2017-02-12 | Discharge: 2017-02-12 | Disposition: A | Discharge status: Home / Self Care | Payer: Medicaid Other | Source: Ambulatory Visit | Attending: Obstetrics and Gynecology | Admitting: Obstetrics and Gynecology

## 2017-02-12 DIAGNOSIS — Z880 Allergy status to penicillin: Secondary | ICD-10-CM | POA: Insufficient documentation

## 2017-02-12 DIAGNOSIS — D259 Leiomyoma of uterus, unspecified: Secondary | ICD-10-CM | POA: Insufficient documentation

## 2017-02-12 DIAGNOSIS — D219 Benign neoplasm of connective and other soft tissue, unspecified: Secondary | ICD-10-CM | POA: Diagnosis not present

## 2017-02-12 DIAGNOSIS — F17210 Nicotine dependence, cigarettes, uncomplicated: Secondary | ICD-10-CM | POA: Diagnosis not present

## 2017-02-12 DIAGNOSIS — I10 Essential (primary) hypertension: Secondary | ICD-10-CM | POA: Diagnosis not present

## 2017-02-12 DIAGNOSIS — R109 Unspecified abdominal pain: Secondary | ICD-10-CM | POA: Insufficient documentation

## 2017-02-12 DIAGNOSIS — Z7982 Long term (current) use of aspirin: Secondary | ICD-10-CM | POA: Diagnosis not present

## 2017-02-12 DIAGNOSIS — N939 Abnormal uterine and vaginal bleeding, unspecified: Secondary | ICD-10-CM | POA: Insufficient documentation

## 2017-02-12 LAB — CBC
HCT: 37.9 % (ref 36.0–46.0)
Hemoglobin: 12.4 g/dL (ref 12.0–15.0)
MCH: 26.3 pg (ref 26.0–34.0)
MCHC: 32.7 g/dL (ref 30.0–36.0)
MCV: 80.5 fL (ref 78.0–100.0)
PLATELETS: 233 10*3/uL (ref 150–400)
RBC: 4.71 MIL/uL (ref 3.87–5.11)
RDW: 14.6 % (ref 11.5–15.5)
WBC: 8.1 10*3/uL (ref 4.0–10.5)

## 2017-02-12 LAB — URINALYSIS, ROUTINE W REFLEX MICROSCOPIC
BILIRUBIN URINE: NEGATIVE
Bacteria, UA: NONE SEEN
Glucose, UA: >=500 mg/dL — AB
KETONES UR: 20 mg/dL — AB
Leukocytes, UA: NEGATIVE
NITRITE: NEGATIVE
PROTEIN: 100 mg/dL — AB
Specific Gravity, Urine: 1.044 — ABNORMAL HIGH (ref 1.005–1.030)
pH: 6 (ref 5.0–8.0)

## 2017-02-12 LAB — POCT PREGNANCY, URINE: Preg Test, Ur: NEGATIVE

## 2017-02-12 MED ORDER — KETOROLAC TROMETHAMINE 60 MG/2ML IM SOLN
60.0000 mg | Freq: Once | INTRAMUSCULAR | Status: AC
  Administered 2017-02-12: 60 mg via INTRAMUSCULAR
  Filled 2017-02-12: qty 2

## 2017-02-12 MED ORDER — IBUPROFEN 600 MG PO TABS: 600.0000 mg | ORAL_TABLET | Freq: Four times a day (QID) | ORAL | 0 refills | Status: DC | PRN

## 2017-02-12 MED ORDER — TRAMADOL HCL 50 MG PO TABS: 50.0000 mg | ORAL_TABLET | Freq: Four times a day (QID) | ORAL | 0 refills | Status: DC | PRN

## 2017-02-12 MED ORDER — ONDANSETRON 8 MG PO TBDP
8.0000 mg | ORAL_TABLET | Freq: Once | ORAL | Status: AC
  Administered 2017-02-12: 8 mg via ORAL
  Filled 2017-02-12: qty 1

## 2017-02-12 NOTE — Discharge Instructions (Signed)
Abnormal Uterine Bleeding °Abnormal uterine bleeding means bleeding more than usual from your uterus. It can include: °· Bleeding between periods. °· Bleeding after sex. °· Bleeding that is heavier than normal. °· Periods that last longer than usual. °· Bleeding after you have stopped having your period (menopause). ° °There are many problems that may cause this. You should see a doctor for any kind of bleeding that is not normal. Treatment depends on the cause of the bleeding. °Follow these instructions at home: °· Watch your condition for any changes. °· Do not use tampons, douche, or have sex, if your doctor tells you not to. °· Change your pads often. °· Get regular well-woman exams. Make sure they include a pelvic exam and cervical cancer screening. °· Keep all follow-up visits as told by your doctor. This is important. °Contact a doctor if: °· The bleeding lasts more than one week. °· You feel dizzy at times. °· You feel like you are going to throw up (nauseous). °· You throw up. °Get help right away if: °· You pass out. °· You have to change pads every hour. °· You have belly (abdominal) pain. °· You have a fever. °· You get sweaty. °· You get weak. °· You passing large blood clots from your vagina. °Summary °· Abnormal uterine bleeding means bleeding more than usual from your uterus. °· There are many problems that may cause this. You should see a doctor for any kind of bleeding that is not normal. °· Treatment depends on the cause of the bleeding. °This information is not intended to replace advice given to you by your health care provider. Make sure you discuss any questions you have with your health care provider. °Document Released: 02/08/2009 Document Revised: 04/07/2016 Document Reviewed: 04/07/2016 °Elsevier Interactive Patient Education © 2017 [employer] Inc[employer] ° °

## 2017-02-12 NOTE — MAU Note (Addendum)
+  vaginal bleeding x3 weeks States only having to change her pad twice a day Few small clots Reports a history of fibroids  +mid lower abdominal pain  Rating pain 10/10 Sharp in nature Intermittent Has tried Excedrin for the pain; states helped a little but pain came back; last dose was last night  States has elevated blood pressure but doesn't take her medication

## 2017-02-12 NOTE — MAU Provider Note (Signed)
History     CSN: [id number]  Arrival date and time: 02/12/17 0930   First Provider Initiated Contact with Patient 02/12/17 1018      Chief Complaint  Patient presents with  . Abdominal Pain  . Vaginal Bleeding  . Fibroids   HPI   Ms.[name] is a 26 y.o. non pregnant female with a history of fibroids here in MAU with complaints of on-going vaginal bleeding. States she has been bleeding for 2 weeks. States she is changing her pad 2-3 times per day. States she just had a new nexplanon put in 2 months ago. States she has had off and on abnormal uterine bleeding in the last 4 years. States she has had nausea and pain and is requesting pain medication and nausea medication. States she has tried taking Excedrin with is not helping. Rates her pain 10/10, pain is located in the middle of her lower abdomen. History of untreated HTN; patient has not taken her BP medication in 2 months.   Pertinent Gynecological History: Menses: flow is light Bleeding: dysfunctional uterine bleeding Contraception: Nexplanon DES exposure: unknown Blood transfusions: none Sexually transmitted diseases: currently at risk Previous GYN Procedures: None  Last mammogram: NA Date: NA Last pap: normal Date: 2015   Past Medical History:  Diagnosis Date  . Anxiety   . Bipolar 1 disorder ([place])   . Cognitive deficits   . Depression   . Diabetes mellitus without complication ([place])   . Hypertension   . Mental disorder   . Obesity     Past Surgical History:  Procedure Laterality Date  . CESAREAN SECTION    . CESAREAN SECTION N/A 04/25/2013   Procedure: REPEAT CESAREAN SECTION;  Surgeon: [name], MD;  Location: [facility];  Service: Obstetrics;  Laterality: N/A;  . MASS EXCISION N/A 06/03/2012   Procedure: EXCISION MASS;  Surgeon: [name], MD;  Location: [facility];  Service: ENT;  Laterality: N/A;  Excision uvula mass  . TONSILLECTOMY N/A 06/03/2012   Procedure: TONSILLECTOMY;   Surgeon: [name], MD;  Location: [facility];  Service: ENT;  Laterality: N/A;  . TONSILLECTOMY      Family History  Problem Relation Age of Onset  . Hypertension Mother   . Diabetes Father     Social History  Substance Use Topics  . Smoking status: Current Every Day Smoker    Packs/day: 1.00    Years: 7.00    Types: Cigarettes  . Smokeless tobacco: Never Used  . Alcohol use No    Allergies:  Allergies  Allergen Reactions  . Wellbutrin [Bupropion] Shortness Of Breath  . Omnipaque [Iohexol] Swelling and Other (See Comments)    Reaction:  Eye swelling  . Penicillins Hives and Other (See Comments)    Has patient had a PCN reaction causing immediate rash, facial/tongue/throat swelling, SOB or lightheadedness with hypotension: No Has patient had a PCN reaction causing severe rash involving mucus membranes or skin necrosis: No Has patient had a PCN reaction that required hospitalization No Has patient had a PCN reaction occurring within the last 10 years: No If all of the above answers are "NO", then may proceed with Cephalosporin use.    Prescriptions Prior to Admission  Medication Sig Dispense Refill Last Dose  . aspirin-acetaminophen-caffeine (EXCEDRIN MIGRAINE) 250-250-65 MG tablet Take 2 tablets by mouth every 6 (six) hours as needed for headache or migraine.   02/11/2017 at Unknown time  . busPIRone (BUSPAR) 15 MG tablet Take 1  tablet (15 mg total) by mouth 2 (two) times daily. For anxiety (Patient taking differently: Take 15 mg by mouth 3 (three) times daily. For anxiety) 60 tablet 0 02/11/2017 at Unknown time  . fluvoxaMINE (LUVOX) 100 MG tablet Take 100 mg by mouth 2 (two) times daily.    Past Week at Unknown time  . glipiZIDE (GLUCOTROL XL) 10 MG 24 hr tablet Take 1 tablet (10 mg total) by mouth every evening. For diabetes management 30 tablet 0 02/11/2017 at Unknown time  . metFORMIN (GLUCOPHAGE) 500 MG tablet Take 1 tablet (500 mg total) by mouth 2  (two) times daily with a meal. For diabetes management 60 tablet 0 02/11/2017 at Unknown time  . omeprazole (PRILOSEC) 20 MG capsule Take 1 capsule (20 mg total) by mouth daily. For acid reflux (Patient taking differently: Take 20 mg by mouth daily as needed. For acid reflux) 10 capsule 0 Past Week at Unknown time  . paliperidone (INVEGA SUSTENNA) 234 MG/1.5ML SUSP injection Inject 234 mg into the muscle once.   Past Month at Unknown time  . clomiPHENE (CLOMID) 50 MG tablet Take 100 mg by mouth at bedtime.   Past Week at Unknown time  . DULoxetine (CYMBALTA) 20 MG capsule Take 40 mg by mouth daily.   Past Week at Unknown time  . solifenacin (VESICARE) 5 MG tablet Take 5 mg by mouth daily as needed (for bladder).   unknown   Results for orders placed or performed during the hospital encounter of 02/12/17 (from the past 48 hour(s))  Urinalysis, Routine w reflex microscopic     Status: Abnormal   Collection Time: 02/12/17  9:30 AM  Result Value Ref Range   Color, Urine YELLOW YELLOW   APPearance CLEAR CLEAR   Specific Gravity, Urine 1.044 (H) 1.005 - 1.030   pH 6.0 5.0 - 8.0   Glucose, UA >=500 (A) NEGATIVE mg/dL   Hgb urine dipstick LARGE (A) NEGATIVE   Bilirubin Urine NEGATIVE NEGATIVE   Ketones, ur 20 (A) NEGATIVE mg/dL   Protein, ur 100 (A) NEGATIVE mg/dL   Nitrite NEGATIVE NEGATIVE   Leukocytes, UA NEGATIVE NEGATIVE   RBC / HPF 6-30 0 - 5 RBC/hpf   WBC, UA 0-5 0 - 5 WBC/hpf   Bacteria, UA NONE SEEN NONE SEEN   Squamous Epithelial / LPF 0-5 (A) NONE SEEN   Mucus PRESENT   CBC     Status: None   Collection Time: 02/12/17  9:48 AM  Result Value Ref Range   WBC 8.1 4.0 - 10.5 K/uL   RBC 4.71 3.87 - 5.11 MIL/uL   Hemoglobin 12.4 12.0 - 15.0 g/dL   HCT 37.9 36.0 - 46.0 %   MCV 80.5 78.0 - 100.0 fL   MCH 26.3 26.0 - 34.0 pg   MCHC 32.7 30.0 - 36.0 g/dL   RDW 14.6 11.5 - 15.5 %   Platelets 233 150 - 400 K/uL  Pregnancy, urine POC     Status: None   Collection Time: 02/12/17  9:49 AM   Result Value Ref Range   Preg Test, Ur NEGATIVE NEGATIVE    Comment:        THE SENSITIVITY OF THIS METHODOLOGY IS >24 mIU/mL    US Pelvic Complete W Transvaginal And Torsion R/o  Result Date: 02/12/2017 CLINICAL DATA:  Continuous severe abdominal pain rated at 10/10, vaginal bleeding, symptoms for 3 weeks EXAM: TRANSABDOMINAL AND TRANSVAGINAL ULTRASOUND OF PELVIS TECHNIQUE: Both transabdominal and transvaginal ultrasound examinations of the pelvis were  performed. Transabdominal technique was performed for global imaging of the pelvis including uterus, ovaries, adnexal regions, and pelvic cul-de-sac. It was necessary to proceed with endovaginal exam following the transabdominal exam to visualize the endometrium and adnexa. COMPARISON:  None ; correlation CT abdomen and pelvis 07/08/2015 FINDINGS: Uterus Measurements: 7.7 x 3.2 x 3.4 cm. Normal morphology without mass Endometrium Thickness: 4 mm thick. Suboptimally visualized due to body habitus on transvaginal imaging and inadequate bladder distention/poor acoustic window on transabdominal imaging. No gross endometrial fluid. Right ovary Measurements: 1.7 x 1.2 x 1.2 cm. Normal morphology without mass Left ovary Measurements: 1.7 x 1.4 x 1.8 cm. Normal morphology without mass Other findings No abnormal free fluid. No adnexal masses. Two subcutaneous nodules are identified infraumbilical adjacent to the anterior muscular planes of the mid pelvis, 3.1 x 3.8 x 3.3 cm to the RIGHT and 2.5 x 2.4 x 2.7 cm to the LEFT; these correspond to nonspecific soft tissue densities seen on prior CT, question due to fibrosis though implants from etiology such is endometriosis could cause a similar appearance. IMPRESSION: Unremarkable uterus and ovaries. Subcutaneous nodules in the anterior abdominal wall 3.1 x 3.8 x 3.3 cm and 2.5 x 2.4 x 2.7 cm, corresponding to areas of masslike opacity on a prior CT exam question fibrosis or scarring related to prior surgeries though  implants from endometriosis could cause a similar appearance ; recommend correlation with patient history. Electronically Signed   By: Lavonia Dana M.D.   On: 02/12/2017 12:43   Review of Systems  Constitutional: Positive for fatigue.  Gastrointestinal: Positive for abdominal pain.  Genitourinary: Positive for vaginal bleeding.  Neurological: Positive for weakness. Negative for dizziness and light-headedness.   Physical Exam   Blood pressure (!) 165/105, pulse (!) 106, temperature 97.8 F (36.6 C), temperature source Oral, resp. rate 20, weight 118.9 kg (262 lb 1.3 oz), SpO2 99 %.   Patient Vitals for the past 24 hrs:  BP Temp Temp src Pulse Resp SpO2 Weight  02/12/17 1353 - - - - 20 - -  02/12/17 1334 (!) 165/105 - - (!) 106 - - -  02/12/17 0945 (!) 145/91 97.8 F (36.6 C) Oral (!) 111 20 99 % -  02/12/17 0936 - - - - - - 118.9 kg (262 lb 1.3 oz)    Physical Exam  Constitutional: She is oriented to person, place, and time. She appears well-developed and well-nourished. No distress.  HENT:  Head: Normocephalic.  Eyes: Pupils are equal, round, and reactive to light.  GI: Soft. She exhibits distension and mass. There is tenderness. There is no rebound and no guarding.  Genitourinary:  Genitourinary Comments: Vagina - Small amount of dark red vaginal bleeding,  no odor  Cervix - + active bleeding; small amount  Bimanual exam: Cervix closed Uterus non tender, enlarged  Adnexa non tender Chaperone present for exam.   Musculoskeletal: Normal range of motion.  Neurological: She is alert and oriented to person, place, and time.  Skin: Skin is warm. She is not diaphoretic.  Psychiatric: Her behavior is normal.    MAU Course  Procedures  None  MDM  Orthostatic vitals WNL Patient not a good candidate for OCP's due to severally elevated BP readings. Minimal bleeding noted on exam, hgb stable today (12.4). Plan to get her into GYN care. Patient is without HA, blurred vision or  chest pain today. Discussed these symptoms with her with strict return precautions to ED if symptoms present. Patient instructed to restart her  BP medication and schedule an appointment with PCP.  Consulted with Dr. Elly Modena who reviewed [place].   Assessment and Plan   A:  1. Fibroids   2. Continuous severe abdominal pain   3. Abnormal vaginal bleeding     P:  Discharge home with strict bleeding precautions Resume BP medication Rx: Ultram and Ibuprofen Return to MAU if symptoms worsen  Increase oral fluid intake   [name], Artist Pais, NP 02/12/2017 3:36 PM

## 2017-02-16 ENCOUNTER — Emergency Department (HOSPITAL_COMMUNITY)
Admission: EM | Admit: 2017-02-16 | Discharge: 2017-02-16 | Disposition: A | Discharge status: Home / Self Care | Payer: Medicaid Other | Attending: Emergency Medicine | Admitting: Emergency Medicine

## 2017-02-16 ENCOUNTER — Encounter (HOSPITAL_COMMUNITY): Payer: Self-pay | Admitting: Emergency Medicine

## 2017-02-16 DIAGNOSIS — R109 Unspecified abdominal pain: Secondary | ICD-10-CM | POA: Diagnosis present

## 2017-02-16 DIAGNOSIS — R1030 Lower abdominal pain, unspecified: Secondary | ICD-10-CM | POA: Insufficient documentation

## 2017-02-16 DIAGNOSIS — F17210 Nicotine dependence, cigarettes, uncomplicated: Secondary | ICD-10-CM | POA: Diagnosis not present

## 2017-02-16 DIAGNOSIS — I10 Essential (primary) hypertension: Secondary | ICD-10-CM | POA: Diagnosis not present

## 2017-02-16 DIAGNOSIS — F419 Anxiety disorder, unspecified: Secondary | ICD-10-CM | POA: Diagnosis not present

## 2017-02-16 DIAGNOSIS — F319 Bipolar disorder, unspecified: Secondary | ICD-10-CM | POA: Insufficient documentation

## 2017-02-16 DIAGNOSIS — F819 Developmental disorder of scholastic skills, unspecified: Secondary | ICD-10-CM | POA: Diagnosis not present

## 2017-02-16 DIAGNOSIS — Z79899 Other long term (current) drug therapy: Secondary | ICD-10-CM | POA: Diagnosis not present

## 2017-02-16 DIAGNOSIS — E119 Type 2 diabetes mellitus without complications: Secondary | ICD-10-CM | POA: Insufficient documentation

## 2017-02-16 DIAGNOSIS — Z7984 Long term (current) use of oral hypoglycemic drugs: Secondary | ICD-10-CM | POA: Insufficient documentation

## 2017-02-16 LAB — CBG MONITORING, ED: Glucose-Capillary: 205 mg/dL — ABNORMAL HIGH (ref 65–99)

## 2017-02-16 NOTE — Discharge Instructions (Signed)
Continue your pain medications. Follow up with [facility] clinic or OB/GYN of your choice. You can contact your family doctor for referral. Eat healthy, watch your blood sugars closely.

## 2017-02-16 NOTE — ED Notes (Signed)
ED Provider at bedside. 

## 2017-02-16 NOTE — ED Provider Notes (Signed)
[facility] DEPT Provider Note   CSN: [id number] Arrival date & time: 02/16/17  1449     History   Chief Complaint Chief Complaint  Patient presents with  . Abdominal Pain  . Emesis    HPI [name] is a 26 y.o. female.  HPI [name] is a 26 y.o. female with history of bipolar disorder and some cognitive deficiencies, diabetes, hypertension, presents to emergency department complaining of abdominal pain. Patient states she has had intermittent pain in her abdomen for about a month. She states pain is just below her umbilicus. She states she has seen her doctor, didn't seem the ER, and recently went to OB/GYN just 4 days ago with same complaint. Patient states that he was told that she had fibroids. She is not sure what she was told to do, states pain is still there, she is taking tramadol that she was given there, states it is not helping. Denies any fever or chills. No vaginal discharge or bleeding at this time. Reports intermittent nausea and vomiting. Reports some loose stools. She also reports constipation. Pain is worsened with palpation just below the umbilicus. States pain is superficial and not deep inside her abdomen. No injuries.  Past Medical History:  Diagnosis Date  . Anxiety   . Bipolar 1 disorder ([place])   . Cognitive deficits   . Depression   . Diabetes mellitus without complication ([place])   . Hypertension   . Mental disorder   . Obesity     Patient Active Problem List   Diagnosis Date Noted  . LGSIL on Pap smear of cervix 02/01/2017  . OCD (obsessive compulsive disorder) 10/05/2016  . Major depressive disorder, recurrent episode, mild ([place]) 05/04/2016  . 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 Learning disability 07/18/2015  . 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 Diabetes mellitus ([place]) 07/18/2015  . 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 Hyperlipidemia  07/18/2015  . Severe episode of recurrent major depressive disorder, without psychotic features ([place])   . Drug overdose   . Cognitive deficits 10/12/2012  . Generalized anxiety disorder 06/28/2012    Past Surgical History:  Procedure Laterality Date  . CESAREAN SECTION    . CESAREAN SECTION N/A 04/25/2013   Procedure: REPEAT CESAREAN SECTION;  Surgeon: [name], MD;  Location: [facility];  Service: Obstetrics;  Laterality: N/A;  . MASS EXCISION N/A 06/03/2012   Procedure: EXCISION MASS;  Surgeon: [name], MD;  Location: [facility];  Service: ENT;  Laterality: N/A;  Excision uvula mass  . TONSILLECTOMY N/A 06/03/2012   Procedure: TONSILLECTOMY;  Surgeon: [name], MD;  Location: [facility];  Service: ENT;  Laterality: N/A;  . TONSILLECTOMY      OB History    Gravida Para Term Preterm AB Living   3 3 3  0 0 3   SAB TAB Ectopic Multiple Live Births   0 0 0 0 3       Home Medications    Prior to Admission medications   Medication Sig Start Date End Date Taking? Authorizing Provider  aspirin-acetaminophen-caffeine (EXCEDRIN MIGRAINE) [id number] [id number] MG tablet Take 2 tablets by mouth every 6 (six) hours as needed for headache or migraine.    [provider]  busPIRone (BUSPAR) 15 MG tablet Take 1 tablet (15 mg total) by mouth 2 (two) times daily. For anxiety Patient taking differently: Take 15 mg by mouth 3 (three) times daily. For anxiety 10/06/16  Lindell Spar I, NP  fluvoxaMINE (LUVOX) 100 MG tablet Take 100 mg by mouth 2 (two) times daily.     [provider]  glipiZIDE (GLUCOTROL XL) 10 MG 24 hr tablet Take 1 tablet (10 mg total) by mouth every evening. For diabetes management 10/06/16   Lindell Spar I, NP  ibuprofen (ADVIL,MOTRIN) 600 MG tablet Take 1 tablet (600 mg total) by mouth every 6 (six) hours as needed. 02/12/17   Rasch, Anderson Malta I, NP  metFORMIN (GLUCOPHAGE) 500 MG tablet Take 1 tablet (500 mg total) by mouth 2  (two) times daily with a meal. For diabetes management 10/06/16   Lindell Spar I, NP  omeprazole (PRILOSEC) 20 MG capsule Take 1 capsule (20 mg total) by mouth daily. For acid reflux Patient taking differently: Take 20 mg by mouth daily as needed. For acid reflux 10/06/16   Lindell Spar I, NP  paliperidone (INVEGA SUSTENNA) 234 MG/1.5ML SUSP injection Inject 234 mg into the muscle once.    [provider]  traMADol (ULTRAM) 50 MG tablet Take 1 tablet (50 mg total) by mouth every 6 (six) hours as needed. 02/12/17   Rasch, Artist Pais, NP    Family History Family History  Problem Relation Age of Onset  . Hypertension Mother   . Diabetes Father     Social History Social History  Substance Use Topics  . Smoking status: Current Every Day Smoker    Packs/day: 1.00    Years: 7.00    Types: Cigarettes  . Smokeless tobacco: Never Used  . Alcohol use No     Allergies   Wellbutrin [bupropion]; Omnipaque [iohexol]; and Penicillins   Review of Systems Review of Systems  Constitutional: Negative for chills and fever.  Respiratory: Negative for cough, chest tightness and shortness of breath.   Cardiovascular: Negative for chest pain, palpitations and leg swelling.  Gastrointestinal: Positive for abdominal pain, constipation, diarrhea, nausea and vomiting.  Genitourinary: Negative for dysuria, flank pain, pelvic pain, vaginal bleeding, vaginal discharge and vaginal pain.  Musculoskeletal: Negative for arthralgias, myalgias, neck pain and neck stiffness.  Skin: Negative for rash.  Neurological: Negative for dizziness, weakness and headaches.  All other systems reviewed and are negative.    Physical Exam Updated Vital Signs BP (!) 136/100 (BP Location: Right Arm)   Pulse 89   Temp 97.9 F (36.6 C) (Oral)   Resp 18   Ht 5\' 7"  (1.702 m)   Wt 118.8 kg (262 lb)   SpO2 97%   BMI 41.04 kg/m   Physical Exam  Constitutional: She appears well-developed and well-nourished. No  distress.  HENT:  Head: Normocephalic.  Eyes: Conjunctivae are normal.  Neck: Neck supple.  Cardiovascular: Normal rate, regular rhythm and normal heart sounds.   Pulmonary/Chest: Effort normal and breath sounds normal. No respiratory distress. She has no wheezes. She has no rales.  Abdominal: Soft. Bowel sounds are normal. She exhibits no distension. There is tenderness. There is no rebound.  Palpable masslike structures just below umbilicus, tender to palpation. Otherwise abdomen is nontender diffusely. Soft.  Musculoskeletal: She exhibits no edema.  Neurological: She is alert.  Skin: Skin is warm and dry.  Psychiatric: She has a normal mood and affect. Her behavior is normal.  Nursing note and vitals reviewed.    ED Treatments / Results  Labs (all labs ordered are listed, but only abnormal results are displayed) Labs Reviewed  CBG MONITORING, ED - Abnormal; Notable for the following:       Result  Value   Glucose-Capillary 205 (*)    All other components within normal limits    EKG  EKG Interpretation None       Radiology No results found.  Procedures Procedures (including critical care time)  Medications Ordered in ED Medications - No data to display   Initial Impression / Assessment and Plan / ED Course  I have reviewed the triage vital signs and the nursing notes.  Pertinent labs & imaging results that were available during my care of the patient were reviewed by me and considered in my medical decision making (see chart for details).   patient with ongoing chronic abdominal pain. Pain is just below her umbilicus. There is masslike structures palpated with light touch, her pain does not appear to be deep, but just superficial on the skin. I reviewed prior records, CT scan from a year ago showed these 2 nodules in her lower abdomen, at the site of the C-section incision. She also had an ultrasound performed 4 days ago at [facility] which confirmed these  structures. Radiologist questioned whether this could be fibrosis or scarring from her C-section versus endometriosis. The ultrasound on that day did not show any evidence of fibroids or any other uterine or ovarian findings. I discussed these results with patient. I advised her to follow-up as an outpatient with OB/GYN. We discussed how she will follow-up, return precautions discussed. At this time I do not think she needs any further labs or imaging in ED. She had blood work and urinalysis performed 4 days ago and again 3 days ago. All unremarkable other than hyperglycemia. We discussed healthier food choices. Close follow-up with PCP as well.  Vitals:   02/16/17 1501  BP: (!) 136/100  Pulse: 89  Resp: 18  Temp: 97.9 F (36.6 C)  TempSrc: Oral  SpO2: 97%  Weight: 118.8 kg (262 lb)  Height: 5\' 7"  (1.702 m)    Final Clinical Impressions(s) / ED Diagnoses   Final diagnoses:  Lower abdominal pain    New Prescriptions New Prescriptions   No medications on file     Jeannett Senior, PA-C 02/17/17 0263    Gareth Morgan, MD 02/17/17 [phone]

## 2017-02-16 NOTE — ED Triage Notes (Signed)
Patient has been out of her HTN medications for 2 months

## 2017-02-16 NOTE — ED Triage Notes (Signed)
Per [facility] patient from home for abd pain for year or more and n/v x couple weeks.

## 2017-02-20 ENCOUNTER — Inpatient Hospital Stay (HOSPITAL_COMMUNITY)
Admission: AD | Admit: 2017-02-20 | Discharge: 2017-02-20 | Disposition: A | Discharge status: Home / Self Care | Payer: Medicaid Other | Source: Ambulatory Visit | Attending: Obstetrics and Gynecology | Admitting: Obstetrics and Gynecology

## 2017-02-20 ENCOUNTER — Encounter (HOSPITAL_COMMUNITY): Payer: Self-pay

## 2017-02-20 DIAGNOSIS — G8929 Other chronic pain: Secondary | ICD-10-CM | POA: Insufficient documentation

## 2017-02-20 DIAGNOSIS — F419 Anxiety disorder, unspecified: Secondary | ICD-10-CM | POA: Insufficient documentation

## 2017-02-20 DIAGNOSIS — I10 Essential (primary) hypertension: Secondary | ICD-10-CM | POA: Insufficient documentation

## 2017-02-20 DIAGNOSIS — E119 Type 2 diabetes mellitus without complications: Secondary | ICD-10-CM | POA: Diagnosis not present

## 2017-02-20 DIAGNOSIS — F319 Bipolar disorder, unspecified: Secondary | ICD-10-CM | POA: Diagnosis not present

## 2017-02-20 DIAGNOSIS — R112 Nausea with vomiting, unspecified: Secondary | ICD-10-CM | POA: Diagnosis present

## 2017-02-20 DIAGNOSIS — Z87891 Personal history of nicotine dependence: Secondary | ICD-10-CM | POA: Insufficient documentation

## 2017-02-20 DIAGNOSIS — R109 Unspecified abdominal pain: Secondary | ICD-10-CM | POA: Diagnosis present

## 2017-02-20 DIAGNOSIS — R1030 Lower abdominal pain, unspecified: Secondary | ICD-10-CM | POA: Diagnosis not present

## 2017-02-20 DIAGNOSIS — E669 Obesity, unspecified: Secondary | ICD-10-CM | POA: Diagnosis not present

## 2017-02-20 LAB — URINALYSIS, ROUTINE W REFLEX MICROSCOPIC
BILIRUBIN URINE: NEGATIVE
Bacteria, UA: NONE SEEN
Hgb urine dipstick: NEGATIVE
KETONES UR: 5 mg/dL — AB
LEUKOCYTES UA: NEGATIVE
NITRITE: NEGATIVE
PH: 6 (ref 5.0–8.0)
Protein, ur: 100 mg/dL — AB
Specific Gravity, Urine: 1.04 — ABNORMAL HIGH (ref 1.005–1.030)

## 2017-02-20 LAB — POCT PREGNANCY, URINE: Preg Test, Ur: NEGATIVE

## 2017-02-20 MED ORDER — ONDANSETRON 4 MG PO TBDP: 4.0000 mg | ORAL_TABLET | Freq: Three times a day (TID) | ORAL | 0 refills | Status: DC | PRN

## 2017-02-20 MED ORDER — ONDANSETRON 8 MG PO TBDP
8.0000 mg | ORAL_TABLET | Freq: Once | ORAL | Status: AC
  Administered 2017-02-20: 8 mg via ORAL
  Filled 2017-02-20: qty 1

## 2017-02-20 MED ORDER — KETOROLAC TROMETHAMINE 10 MG PO TABS: 10.0000 mg | ORAL_TABLET | Freq: Four times a day (QID) | ORAL | 0 refills | Status: DC | PRN

## 2017-02-20 NOTE — MAU Provider Note (Signed)
History   26 YO black female in with c/o chronic low abd pain that is ongoing in nature. Also c/o nausea.  CSN: [id number]  Arrival date & time 02/20/17  1342   None     Chief Complaint  Patient presents with  . Abdominal Pain  . Emesis  . Nausea    HPI  Past Medical History:  Diagnosis Date  . Anxiety   . Bipolar 1 disorder ([place])   . Cognitive deficits   . Depression   . Diabetes mellitus without complication ([place])   . Hypertension   . Mental disorder   . Obesity     Past Surgical History:  Procedure Laterality Date  . CESAREAN SECTION    . CESAREAN SECTION N/A 04/25/2013   Procedure: REPEAT CESAREAN SECTION;  Surgeon: [name], MD;  Location: [facility];  Service: Obstetrics;  Laterality: N/A;  . MASS EXCISION N/A 06/03/2012   Procedure: EXCISION MASS;  Surgeon: [name], MD;  Location: [facility];  Service: ENT;  Laterality: N/A;  Excision uvula mass  . TONSILLECTOMY N/A 06/03/2012   Procedure: TONSILLECTOMY;  Surgeon: [name], MD;  Location: [facility];  Service: ENT;  Laterality: N/A;  . TONSILLECTOMY      Family History  Problem Relation Age of Onset  . Hypertension Mother   . Diabetes Father     Social History  Substance Use Topics  . Smoking status: Former Smoker    Packs/day: 1.00    Years: 7.00    Types: Cigarettes    Quit date: 02/14/2017  . Smokeless tobacco: Never Used  . Alcohol use No    OB History    Gravida Para Term Preterm AB Living   3 3 3  0 0 3   SAB TAB Ectopic Multiple Live Births   0 0 0 0 3      Review of Systems  Constitutional: Negative.   HENT: Negative.   Eyes: Negative.   Respiratory: Negative.   Cardiovascular: Negative.   Gastrointestinal: Positive for abdominal pain.  Endocrine: Negative.   Genitourinary: Negative.   Musculoskeletal: Negative.   Skin: Negative.   Allergic/Immunologic: Negative.   Neurological: Negative.   Hematological: Negative.    Psychiatric/Behavioral: Negative.     Allergies  Wellbutrin [bupropion]; Omnipaque [iohexol]; and Penicillins  Home Medications    BP 129/89 (BP Location: Left Arm)   Pulse (!) 110   Temp 98.4 F (36.9 C) (Oral)   Resp 20   Physical Exam  Constitutional: She is oriented to person, place, and time. She appears well-developed and well-nourished.  HENT:  Head: Normocephalic.  Eyes: Pupils are equal, round, and reactive to light.  Neck: Normal range of motion.  Cardiovascular: Normal rate, regular rhythm, normal heart sounds and intact distal pulses.   Pulmonary/Chest: Effort normal and breath sounds normal.  Abdominal: Soft. Bowel sounds are normal.  Genitourinary: Vagina normal and uterus normal.  Musculoskeletal: Normal range of motion.  Neurological: She is alert and oriented to person, place, and time. She has normal reflexes.  Skin: Skin is warm and dry.  Psychiatric: She has a normal mood and affect. Her behavior is normal. Judgment and thought content normal.    MAU Course  Procedures (including critical care time)  Labs Reviewed  URINALYSIS, ROUTINE W REFLEX MICROSCOPIC - Abnormal; Notable for the following:       Result Value   Specific Gravity, Urine 1.040 (*)    Glucose, UA >=500 (*)  Ketones, ur 5 (*)    Protein, ur 100 (*)    Squamous Epithelial / LPF 0-5 (*)    All other components within normal limits  POCT PREGNANCY, URINE   No results found.   1. Lower abdominal pain       MDM  lengthty discussion with pt regarding her CT scan, and pelvic u/s and the fact that her pain is more likely caused by scar tissue. Pt verbalized understanding. VSS, abd soft and non tender. Will d/c home with toradol and zofran

## 2017-02-20 NOTE — MAU Note (Signed)
Reports having fibroid tumors that are causing pain, n/v. Has had fibroids for 4 years and the problems are getting worse. No vaginal bleeding currently.

## 2017-02-20 NOTE — Discharge Instructions (Signed)

## 2017-02-23 ENCOUNTER — Telehealth: Payer: Self-pay | Admitting: Obstetrics and Gynecology

## 2017-02-23 NOTE — Telephone Encounter (Signed)
GYN Telephone Note  Patient called and I was able to get in contact with her. I told her that based on her age and an LSIL pap that I recommend colposcopy, which is normally done in the office, but her pap smear was so difficult, based on her anatomy, that I feel an in office colpo would be difficult. It looks like she has an annual next week with Dr. Elly Modena, and I told her that she'll get an exam (not a colpo) then and if the exam is easier for Dr. Elly Modena, then we can see about her doing her in office colpo, but if Dr. Elly Modena also finds it difficult then we can move forward with an OR colpo.   Pt states she doesn't believe she's ever had an abnormal pap before, and she also isn't having any bleeding. She also states she stopped smoking about 1.5 wks ago and I told her that stopping smoking and following up with these exams is the best way to hopefully get rid of the abnormal cells.  All questions asked and answered  Durene Romans MD Attending [facility] [employer] [facility] (Faculty Practice) 02/23/2017 Time: [phone]

## 2017-02-25 ENCOUNTER — Emergency Department (HOSPITAL_COMMUNITY): Payer: Medicaid Other

## 2017-02-25 ENCOUNTER — Emergency Department (HOSPITAL_COMMUNITY)
Admission: EM | Admit: 2017-02-25 | Discharge: 2017-02-25 | Discharge status: Against Medical Advice | Payer: Medicaid Other | Attending: Emergency Medicine | Admitting: Emergency Medicine

## 2017-02-25 ENCOUNTER — Encounter (HOSPITAL_COMMUNITY): Payer: Self-pay

## 2017-02-25 DIAGNOSIS — I10 Essential (primary) hypertension: Secondary | ICD-10-CM | POA: Diagnosis not present

## 2017-02-25 DIAGNOSIS — Z7984 Long term (current) use of oral hypoglycemic drugs: Secondary | ICD-10-CM | POA: Diagnosis not present

## 2017-02-25 DIAGNOSIS — R112 Nausea with vomiting, unspecified: Secondary | ICD-10-CM | POA: Insufficient documentation

## 2017-02-25 DIAGNOSIS — R197 Diarrhea, unspecified: Secondary | ICD-10-CM | POA: Insufficient documentation

## 2017-02-25 DIAGNOSIS — E876 Hypokalemia: Secondary | ICD-10-CM | POA: Insufficient documentation

## 2017-02-25 DIAGNOSIS — Z7982 Long term (current) use of aspirin: Secondary | ICD-10-CM | POA: Insufficient documentation

## 2017-02-25 DIAGNOSIS — Z87891 Personal history of nicotine dependence: Secondary | ICD-10-CM | POA: Insufficient documentation

## 2017-02-25 DIAGNOSIS — E119 Type 2 diabetes mellitus without complications: Secondary | ICD-10-CM | POA: Diagnosis not present

## 2017-02-25 DIAGNOSIS — R109 Unspecified abdominal pain: Secondary | ICD-10-CM | POA: Diagnosis not present

## 2017-02-25 DIAGNOSIS — Z79899 Other long term (current) drug therapy: Secondary | ICD-10-CM | POA: Diagnosis not present

## 2017-02-25 DIAGNOSIS — R4183 Borderline intellectual functioning: Secondary | ICD-10-CM | POA: Insufficient documentation

## 2017-02-25 LAB — CBC
HEMATOCRIT: 41.7 % (ref 36.0–46.0)
Hemoglobin: 13.6 g/dL (ref 12.0–15.0)
MCH: 26.1 pg (ref 26.0–34.0)
MCHC: 32.6 g/dL (ref 30.0–36.0)
MCV: 80 fL (ref 78.0–100.0)
Platelets: 199 10*3/uL (ref 150–400)
RBC: 5.21 MIL/uL — ABNORMAL HIGH (ref 3.87–5.11)
RDW: 13.8 % (ref 11.5–15.5)
WBC: 8.9 10*3/uL (ref 4.0–10.5)

## 2017-02-25 LAB — URINALYSIS, ROUTINE W REFLEX MICROSCOPIC
Bilirubin Urine: NEGATIVE
Glucose, UA: 150 mg/dL — AB
Hgb urine dipstick: NEGATIVE
Ketones, ur: 5 mg/dL — AB
Leukocytes, UA: NEGATIVE
Nitrite: NEGATIVE
Protein, ur: >=300 mg/dL — AB
SPECIFIC GRAVITY, URINE: 1.03 (ref 1.005–1.030)
pH: 6 (ref 5.0–8.0)

## 2017-02-25 LAB — COMPREHENSIVE METABOLIC PANEL
ALBUMIN: 4.5 g/dL (ref 3.5–5.0)
ALT: 26 U/L (ref 14–54)
AST: 33 U/L (ref 15–41)
Alkaline Phosphatase: 48 U/L (ref 38–126)
Anion gap: 16 — ABNORMAL HIGH (ref 5–15)
BILIRUBIN TOTAL: 0.7 mg/dL (ref 0.3–1.2)
BUN: 12 mg/dL (ref 6–20)
CHLORIDE: 97 mmol/L — AB (ref 101–111)
CO2: 25 mmol/L (ref 22–32)
Calcium: 9.3 mg/dL (ref 8.9–10.3)
Creatinine, Ser: 0.89 mg/dL (ref 0.44–1.00)
GFR calc Af Amer: >60 mL/min (ref >60)
GFR calc non Af Amer: >60 mL/min (ref >60)
GLUCOSE: 241 mg/dL — AB (ref 65–99)
POTASSIUM: 2.7 mmol/L — AB (ref 3.5–5.1)
Sodium: 138 mmol/L (ref 135–145)
TOTAL PROTEIN: 7.9 g/dL (ref 6.5–8.1)

## 2017-02-25 LAB — I-STAT BETA HCG BLOOD, ED (MC, WL, AP ONLY)

## 2017-02-25 LAB — POC URINE PREG, ED: Preg Test, Ur: NEGATIVE

## 2017-02-25 LAB — LIPASE, BLOOD: Lipase: 22 U/L (ref 11–51)

## 2017-02-25 MED ORDER — BARIUM SULFATE 2 % PO SUSP: 450.0000 mL | Freq: Once | ORAL | Status: DC

## 2017-02-25 MED ORDER — BARIUM SULFATE 2 % PO SUSP
450.0000 mL | Freq: Once | ORAL | Status: AC
  Administered 2017-02-25: 450 mL via ORAL

## 2017-02-25 MED ORDER — ONDANSETRON HCL 4 MG/2ML IJ SOLN
4.0000 mg | Freq: Once | INTRAMUSCULAR | Status: AC
  Administered 2017-02-25: 4 mg via INTRAVENOUS
  Filled 2017-02-25: qty 2

## 2017-02-25 MED ORDER — SODIUM CHLORIDE 0.9 % IV BOLUS (SEPSIS)
1000.0000 mL | Freq: Once | INTRAVENOUS | Status: AC
  Administered 2017-02-25: 1000 mL via INTRAVENOUS

## 2017-02-25 MED ORDER — POTASSIUM CHLORIDE 10 MEQ/100ML IV SOLN
10.0000 meq | INTRAVENOUS | Status: AC
  Administered 2017-02-25 (×2): 10 meq via INTRAVENOUS
  Filled 2017-02-25 (×2): qty 100

## 2017-02-25 MED ORDER — POTASSIUM CHLORIDE CRYS ER 20 MEQ PO TBCR
40.0000 meq | EXTENDED_RELEASE_TABLET | Freq: Once | ORAL | Status: AC
  Administered 2017-02-25: 40 meq via ORAL
  Filled 2017-02-25: qty 2

## 2017-02-25 NOTE — ED Notes (Signed)
Patient transported to CT 

## 2017-02-25 NOTE — ED Provider Notes (Signed)
[facility] DEPT Provider Note   CSN: [id number] Arrival date & time: 02/25/17  1703     History   Chief Complaint Chief Complaint  Patient presents with  . Nausea  . Emesis  . Diarrhea    HPI [name] is a 27 y.o. female.  Patient is a 26 year old female with a history of diabetes, bipolar disorder and recurrent abdominal pain who presents with worsening abdominal pain.  She has been seen here twice before last month for similar complaints.  She reports that she has had a 2-week history of nausea and vomiting.  She states she is not able to keep anything down.  She denies any fevers.  She has pain to her lower abdomen.  She has loose stools but she states that this is chronic for her.  She denies any hematemesis.  She reports some intermittent blood in her stools which is also not uncommon for her.  She denies any vaginal bleeding or discharge.  She states that she saw her PCP who was in the process of ordering a CT scan for her abdominal pain and vomiting but it has not yet been done.  She denies any urinary symptoms.  No fevers.  She is not taking any medications for the symptoms.      Past Medical History:  Diagnosis Date  . Anxiety   . Bipolar 1 disorder ([place])   . Cognitive deficits   . Depression   . Diabetes mellitus without complication ([place])   . Hypertension   . Mental disorder   . Obesity     Patient Active Problem List   Diagnosis Date Noted  . LGSIL on Pap smear of cervix 02/01/2017  . OCD (obsessive compulsive disorder) 10/05/2016  . Major depressive disorder, recurrent episode, mild ([place]) 05/04/2016  . 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 Learning disability 07/18/2015  . 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 Diabetes mellitus ([place]) 07/18/2015  . 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 Hyperlipidemia 07/18/2015  . Severe episode of recurrent major depressive  disorder, without psychotic features ([place])   . Drug overdose   . Cognitive deficits 10/12/2012  . Generalized anxiety disorder 06/28/2012    Past Surgical History:  Procedure Laterality Date  . CESAREAN SECTION    . CESAREAN SECTION N/A 04/25/2013   Procedure: REPEAT CESAREAN SECTION;  Surgeon: [name], MD;  Location: [facility];  Service: Obstetrics;  Laterality: N/A;  . MASS EXCISION N/A 06/03/2012   Procedure: EXCISION MASS;  Surgeon: [name], MD;  Location: [facility];  Service: ENT;  Laterality: N/A;  Excision uvula mass  . TONSILLECTOMY N/A 06/03/2012   Procedure: TONSILLECTOMY;  Surgeon: [name], MD;  Location: [facility];  Service: ENT;  Laterality: N/A;  . TONSILLECTOMY      OB History    Gravida Para Term Preterm AB Living   3 3 3  0 0 3   SAB TAB Ectopic Multiple Live Births   0 0 0 0 3       Home Medications    Prior to Admission medications   Medication Sig Start Date End Date Taking? Authorizing Provider  aspirin-acetaminophen-caffeine (EXCEDRIN MIGRAINE) [phone] MG tablet Take 2 tablets by mouth every 6 (six) hours as needed for headache or migraine.   Yes [provider]  busPIRone (BUSPAR) 15 MG tablet Take 1 tablet (15 mg total) by mouth 2 (two) times daily. For anxiety 10/06/16  Yes Lindell Spar I, NP  esomeprazole (NEXIUM) 40 MG capsule Take ONE CAPSULE once a day 30 days 02/24/17  Yes [provider]  fluvoxaMINE (LUVOX) 50 MG tablet Take 150 mg by mouth at bedtime. 02/02/17  Yes [provider]  glipiZIDE (GLUCOTROL XL) 10 MG 24 hr tablet Take 1 tablet (10 mg total) by mouth every evening. For diabetes management 10/06/16  Yes Lindell Spar I, NP  hydrochlorothiazide (HYDRODIURIL) 25 MG tablet Take 1 TABLET BY MOUTH EVERY DAY for 30 days 02/15/17  Yes [provider]  ibuprofen (ADVIL,MOTRIN) 600 MG tablet Take 1 tablet (600 mg total) by mouth every 6 (six) hours as needed.  02/12/17  Yes Rasch, Artist Pais, NP  LEVEMIR FLEXTOUCH 100 UNIT/ML Pen Inject 10 units at bedtime 1500/45=34 (Max use 45 units daily) 02/10/17  Yes [provider]  metFORMIN (GLUCOPHAGE) 1000 MG tablet Take 1 TABLET BY MOUTH TWICE DAILY with breakfast and supper 02/02/17  Yes [provider]  ondansetron (ZOFRAN ODT) 4 MG disintegrating tablet Take 1 tablet (4 mg total) by mouth every 8 (eight) hours as needed for nausea or vomiting. 02/20/17  Yes Keitha Butte, CNM  ketorolac (TORADOL) 10 MG tablet Take 1 tablet (10 mg total) by mouth every 6 (six) hours as needed. Patient not taking: Reported on 02/25/2017 02/20/17   Keitha Butte, CNM  metFORMIN (GLUCOPHAGE) 500 MG tablet Take 1 tablet (500 mg total) by mouth 2 (two) times daily with a meal. For diabetes management Patient not taking: Reported on 02/25/2017 10/06/16   Lindell Spar I, NP  omeprazole (PRILOSEC) 20 MG capsule Take 1 capsule (20 mg total) by mouth daily. For acid reflux Patient taking differently: Take 20 mg by mouth daily as needed. For acid reflux 10/06/16   Lindell Spar I, NP  traMADol (ULTRAM) 50 MG tablet Take 1 tablet (50 mg total) by mouth every 6 (six) hours as needed. Patient not taking: Reported on 02/25/2017 02/12/17   Rasch, Artist Pais, NP    Family History Family History  Problem Relation Age of Onset  . Hypertension Mother   . Diabetes Father     Social History Social History  Substance Use Topics  . Smoking status: Former Smoker    Packs/day: 1.00    Years: 7.00    Types: Cigarettes    Quit date: 02/14/2017  . Smokeless tobacco: Never Used  . Alcohol use No     Allergies   Wellbutrin [bupropion]; Omnipaque [iohexol]; and Penicillins   Review of Systems Review of Systems  Constitutional: Negative for chills, diaphoresis, fatigue and fever.  HENT: Negative for congestion, rhinorrhea and sneezing.   Eyes: Negative.   Respiratory: Negative for cough, chest tightness and shortness  of breath.   Cardiovascular: Negative for chest pain and leg swelling.  Gastrointestinal: Positive for abdominal pain, blood in stool, nausea and vomiting. Negative for diarrhea.  Genitourinary: Negative for difficulty urinating, flank pain, frequency and hematuria.  Musculoskeletal: Negative for arthralgias and back pain.  Skin: Negative for rash.  Neurological: Negative for dizziness, speech difficulty, weakness, numbness and headaches.     Physical Exam Updated Vital Signs BP 132/75 (BP Location: Left Arm) Comment: Simultaneous filing. User may not have seen previous data.  Pulse (!) 108   Temp 98.3 F (36.8 C) (Oral)   Resp 17   Ht 5\' 7"  (1.702 m)   Wt 118.8 kg (262 lb)   SpO2 98%   BMI 41.04 kg/m   Physical Exam  Constitutional: She is oriented to person, place, and time. She appears well-developed and well-nourished.  HENT:  Head: Normocephalic and atraumatic.  Eyes: Pupils are equal, round, and reactive to light.  Neck: Normal range of motion. Neck supple.  Cardiovascular: Regular rhythm and normal heart sounds.  Tachycardia present.   Pulmonary/Chest: Effort normal and breath sounds normal. No respiratory distress. She has no wheezes. She has no rales. She exhibits no tenderness.  Abdominal: Soft. Bowel sounds are normal. There is tenderness (Positive tenderness across the suprapubic area and the right upper and lower abdomen). There is no rebound and no guarding.  Musculoskeletal: Normal range of motion. She exhibits no edema.  Lymphadenopathy:    She has no cervical adenopathy.  Neurological: She is alert and oriented to person, place, and time.  Skin: Skin is warm and dry. No rash noted.  Psychiatric: She has a normal mood and affect.     ED Treatments / Results  Labs (all labs ordered are listed, but only abnormal results are displayed) Labs Reviewed  COMPREHENSIVE METABOLIC PANEL - Abnormal; Notable for the following:       Result Value   Potassium 2.7 (*)     Chloride 97 (*)    Glucose, Bld 241 (*)    Anion gap 16 (*)    All other components within normal limits  CBC - Abnormal; Notable for the following:    RBC 5.21 (*)    All other components within normal limits  URINALYSIS, ROUTINE W REFLEX MICROSCOPIC - Abnormal; Notable for the following:    Color, Urine AMBER (*)    APPearance HAZY (*)    Glucose, UA 150 (*)    Ketones, ur 5 (*)    Protein, ur >=300 (*)    Bacteria, UA RARE (*)    Squamous Epithelial / LPF 6-30 (*)    All other components within normal limits  LIPASE, BLOOD  I-STAT BETA HCG BLOOD, ED ([facility], [facility], [facility] ONLY)  POC URINE PREG, ED    EKG  EKG Interpretation None       Radiology Ct Abdomen Pelvis Wo Contrast  Result Date: 02/25/2017 CLINICAL DATA:  26 year old female with abdominal pain today and nausea and vomiting for 2 weeks. EXAM: CT ABDOMEN AND PELVIS WITHOUT CONTRAST TECHNIQUE: Multidetector CT imaging of the abdomen and pelvis was performed following the standard protocol without IV contrast. COMPARISON:  07/08/2015 CT FINDINGS: Please note that parenchymal abnormalities may be missed without intravenous contrast. Lower chest: No acute abnormality Hepatobiliary: Hepatic steatosis noted. The gallbladder is unremarkable. No biliary dilatation. Pancreas: Unremarkable Spleen: Unremarkable Adrenals/Urinary Tract: The kidneys, adrenal glands and kidneys are unremarkable. Stomach/Bowel: Stomach is within normal limits. Appendix appears normal. No evidence of bowel wall thickening, distention, or inflammatory changes. Vascular/Lymphatic: No significant vascular findings are present. No enlarged abdominal or pelvic lymph nodes. Reproductive: Uterus and bilateral adnexa are unremarkable. Other: No ascites, focal collection or pneumoperitoneum. Nodular soft tissue structures along the anterior pelvic wall have not significantly changed. Musculoskeletal: No acute or suspicious abnormalities. IMPRESSION: 1. No evidence of acute  abnormality 2. Hepatic steatosis 3. Unchanged soft tissue structures along the anterior pelvic wall -likely scarring changes. Electronically Signed   By: Margarette Canada M.D.   On: 02/25/2017 20:34    Procedures Procedures (including critical care time)  Medications Ordered in ED Medications  barium (READI-CAT 2) 2 % suspension 450 mL (not administered)  sodium chloride 0.9 % bolus 1,000 mL (0 mLs Intravenous Stopped 02/25/17 2050)  ondansetron (  ZOFRAN) injection 4 mg (4 mg Intravenous Given 02/25/17 1944)  potassium chloride SA (K-DUR,KLOR-CON) CR tablet 40 mEq (40 mEq Oral Given 02/25/17 1945)  potassium chloride 10 mEq in 100 mL IVPB (0 mEq Intravenous Stopped 02/25/17 2229)  barium (READI-CAT 2) 2 % suspension 450 mL (450 mLs Oral Given 02/25/17 2006)     Initial Impression / Assessment and Plan / ED Course  I have reviewed the triage vital signs and the nursing notes.  Pertinent labs & imaging results that were available during my care of the patient were reviewed by me and considered in my medical decision making (see chart for details).     Patient is a 26 year old female who presents with nausea and vomiting with abdominal pain.  Her CT scan is negative for acute abnormalities.  She was noted to be hypokalemic.  She was given potassium replacement in the ED.  She states she was feeling better however she ended up leaving AMA prior to p.o. challenge and completion of treatment.  Final Clinical Impressions(s) / ED Diagnoses   Final diagnoses:  Nausea vomiting and diarrhea  Hypokalemia    New Prescriptions Discharge Medication List as of 02/25/2017 10:56 PM       Malvin Johns, MD 02/25/17 2336

## 2017-02-25 NOTE — ED Notes (Signed)
ED Provider at bedside. 

## 2017-02-25 NOTE — ED Triage Notes (Signed)
Per [facility]- Pt c/o of N/V x 2 weeks. Diarrhea today. Chronic complaints of these issues. Stomach pain started upon arrival to ED. Denies injury. Pt has not been on HTN medications

## 2017-02-25 NOTE — ED Notes (Addendum)
Pt refusing to keep arm straight in order for medications to be delivered properly. Pt stated medication was hurting her. Medication flow rate was decreased significantly and flow rate of saline was increased. Pt continues to complain and bending arm intentionally preventing flow of medication. IV was replaced by staff and pt continues to refuse compliance. Pt demanding to leave, Dr. Tamera Punt notified. Pt leaving AMA.

## 2017-03-01 ENCOUNTER — Encounter (HOSPITAL_COMMUNITY): Payer: Self-pay | Admitting: Emergency Medicine

## 2017-03-01 ENCOUNTER — Other Ambulatory Visit: Payer: Self-pay

## 2017-03-01 ENCOUNTER — Emergency Department (HOSPITAL_COMMUNITY)
Admission: EM | Admit: 2017-03-01 | Discharge: 2017-03-02 | Disposition: A | Discharge status: Home / Self Care | Payer: Medicaid Other | Attending: Emergency Medicine | Admitting: Emergency Medicine

## 2017-03-01 DIAGNOSIS — R4183 Borderline intellectual functioning: Secondary | ICD-10-CM | POA: Insufficient documentation

## 2017-03-01 DIAGNOSIS — I10 Essential (primary) hypertension: Secondary | ICD-10-CM | POA: Insufficient documentation

## 2017-03-01 DIAGNOSIS — Z7982 Long term (current) use of aspirin: Secondary | ICD-10-CM | POA: Insufficient documentation

## 2017-03-01 DIAGNOSIS — E1165 Type 2 diabetes mellitus with hyperglycemia: Secondary | ICD-10-CM

## 2017-03-01 DIAGNOSIS — F330 Major depressive disorder, recurrent, mild: Secondary | ICD-10-CM | POA: Diagnosis not present

## 2017-03-01 DIAGNOSIS — E876 Hypokalemia: Secondary | ICD-10-CM | POA: Insufficient documentation

## 2017-03-01 DIAGNOSIS — E119 Type 2 diabetes mellitus without complications: Secondary | ICD-10-CM | POA: Diagnosis not present

## 2017-03-01 DIAGNOSIS — Z7984 Long term (current) use of oral hypoglycemic drugs: Secondary | ICD-10-CM | POA: Diagnosis not present

## 2017-03-01 DIAGNOSIS — R45851 Suicidal ideations: Secondary | ICD-10-CM | POA: Insufficient documentation

## 2017-03-01 DIAGNOSIS — Z79899 Other long term (current) drug therapy: Secondary | ICD-10-CM | POA: Insufficient documentation

## 2017-03-01 DIAGNOSIS — Z794 Long term (current) use of insulin: Secondary | ICD-10-CM

## 2017-03-01 DIAGNOSIS — Z008 Encounter for other general examination: Secondary | ICD-10-CM

## 2017-03-01 LAB — SALICYLATE LEVEL: Salicylate Lvl: <7 mg/dL (ref 2.8–30.0)

## 2017-03-01 LAB — COMPREHENSIVE METABOLIC PANEL
ALK PHOS: 51 U/L (ref 38–126)
ALT: 22 U/L (ref 14–54)
AST: 25 U/L (ref 15–41)
Albumin: 4.3 g/dL (ref 3.5–5.0)
Anion gap: 13 (ref 5–15)
BUN: 8 mg/dL (ref 6–20)
CALCIUM: 9.4 mg/dL (ref 8.9–10.3)
CHLORIDE: 95 mmol/L — AB (ref 101–111)
CO2: 31 mmol/L (ref 22–32)
CREATININE: 0.73 mg/dL (ref 0.44–1.00)
GFR calc Af Amer: >60 mL/min (ref >60)
GFR calc non Af Amer: >60 mL/min (ref >60)
GLUCOSE: 268 mg/dL — AB (ref 65–99)
Potassium: 3.1 mmol/L — ABNORMAL LOW (ref 3.5–5.1)
SODIUM: 139 mmol/L (ref 135–145)
Total Bilirubin: 1 mg/dL (ref 0.3–1.2)
Total Protein: 7.9 g/dL (ref 6.5–8.1)

## 2017-03-01 LAB — CBC WITH DIFFERENTIAL/PLATELET
BASOS ABS: 0 10*3/uL (ref 0.0–0.1)
Basophils Relative: 1 %
EOS ABS: 0.3 10*3/uL (ref 0.0–0.7)
EOS PCT: 4 %
HCT: 39.5 % (ref 36.0–46.0)
HEMOGLOBIN: 13.1 g/dL (ref 12.0–15.0)
LYMPHS ABS: 3.3 10*3/uL (ref 0.7–4.0)
LYMPHS PCT: 42 %
MCH: 26.1 pg (ref 26.0–34.0)
MCHC: 33.2 g/dL (ref 30.0–36.0)
MCV: 78.8 fL (ref 78.0–100.0)
Monocytes Absolute: 0.5 10*3/uL (ref 0.1–1.0)
Monocytes Relative: 7 %
NEUTROS PCT: 47 %
Neutro Abs: 3.6 10*3/uL (ref 1.7–7.7)
PLATELETS: 189 10*3/uL (ref 150–400)
RBC: 5.01 MIL/uL (ref 3.87–5.11)
RDW: 13.5 % (ref 11.5–15.5)
WBC: 7.7 10*3/uL (ref 4.0–10.5)

## 2017-03-01 LAB — RAPID URINE DRUG SCREEN, HOSP PERFORMED
AMPHETAMINES: NOT DETECTED
BARBITURATES: NOT DETECTED
BENZODIAZEPINES: NOT DETECTED
COCAINE: NOT DETECTED
Opiates: NOT DETECTED
TETRAHYDROCANNABINOL: NOT DETECTED

## 2017-03-01 LAB — I-STAT BETA HCG BLOOD, ED (MC, WL, AP ONLY)

## 2017-03-01 LAB — ACETAMINOPHEN LEVEL: Acetaminophen (Tylenol), Serum: <10 ug/mL — ABNORMAL LOW (ref 10–30)

## 2017-03-01 LAB — CBG MONITORING, ED
GLUCOSE-CAPILLARY: 356 mg/dL — AB (ref 65–99)
Glucose-Capillary: 300 mg/dL — ABNORMAL HIGH (ref 65–99)
Glucose-Capillary: 384 mg/dL — ABNORMAL HIGH (ref 65–99)

## 2017-03-01 LAB — ETHANOL: Alcohol, Ethyl (B): <10 mg/dL (ref <10)

## 2017-03-01 MED ORDER — INSULIN DETEMIR 100 UNIT/ML ~~LOC~~ SOLN
10.0000 [IU] | Freq: Every day | SUBCUTANEOUS | Status: DC
  Administered 2017-03-01: 10 [IU] via SUBCUTANEOUS
  Filled 2017-03-01 (×2): qty 0.1

## 2017-03-01 MED ORDER — GLIPIZIDE ER 10 MG PO TB24
10.0000 mg | ORAL_TABLET | Freq: Every day | ORAL | Status: DC
  Administered 2017-03-01: 10 mg via ORAL
  Filled 2017-03-01: qty 1

## 2017-03-01 MED ORDER — HYDROXYZINE PAMOATE 25 MG PO CAPS: 25.0000 mg | ORAL_CAPSULE | Freq: Three times a day (TID) | ORAL | Status: DC | PRN

## 2017-03-01 MED ORDER — POTASSIUM CHLORIDE CRYS ER 20 MEQ PO TBCR
60.0000 meq | EXTENDED_RELEASE_TABLET | Freq: Once | ORAL | Status: AC
  Administered 2017-03-01: 60 meq via ORAL
  Filled 2017-03-01: qty 3

## 2017-03-01 MED ORDER — FLUVOXAMINE MALEATE 50 MG PO TABS
120.0000 mg | ORAL_TABLET | Freq: Every day | ORAL | Status: DC
  Administered 2017-03-01: 125 mg via ORAL
  Filled 2017-03-01: qty 1

## 2017-03-01 MED ORDER — HYDROXYZINE HCL 25 MG PO TABS: 25.0000 mg | ORAL_TABLET | Freq: Three times a day (TID) | ORAL | Status: DC | PRN

## 2017-03-01 MED ORDER — METFORMIN HCL 500 MG PO TABS
500.0000 mg | ORAL_TABLET | Freq: Two times a day (BID) | ORAL | Status: DC
  Administered 2017-03-01 – 2017-03-02 (×2): 500 mg via ORAL
  Filled 2017-03-01 (×2): qty 1

## 2017-03-01 MED ORDER — DULOXETINE HCL 30 MG PO CPEP
60.0000 mg | ORAL_CAPSULE | Freq: Every day | ORAL | Status: DC
  Administered 2017-03-01 – 2017-03-02 (×2): 60 mg via ORAL
  Filled 2017-03-01 (×2): qty 2

## 2017-03-01 MED ORDER — BUSPIRONE HCL 10 MG PO TABS
15.0000 mg | ORAL_TABLET | Freq: Two times a day (BID) | ORAL | Status: DC
  Administered 2017-03-01: 15 mg via ORAL
  Administered 2017-03-02: 11:00:00 via ORAL
  Filled 2017-03-01 (×2): qty 2

## 2017-03-01 MED ORDER — PANTOPRAZOLE SODIUM 40 MG PO TBEC
40.0000 mg | DELAYED_RELEASE_TABLET | Freq: Every day | ORAL | Status: DC
  Administered 2017-03-01 – 2017-03-02 (×2): 40 mg via ORAL
  Filled 2017-03-01 (×2): qty 1

## 2017-03-01 MED ORDER — ALUM & MAG HYDROXIDE-SIMETH 200-200-20 MG/5ML PO SUSP: 30.0000 mL | Freq: Four times a day (QID) | ORAL | Status: DC | PRN

## 2017-03-01 MED ORDER — ZOLPIDEM TARTRATE 5 MG PO TABS: 5.0000 mg | ORAL_TABLET | Freq: Every evening | ORAL | Status: DC | PRN

## 2017-03-01 MED ORDER — ACETAMINOPHEN 325 MG PO TABS: 650.0000 mg | ORAL_TABLET | ORAL | Status: DC | PRN

## 2017-03-01 MED ORDER — HYDROCHLOROTHIAZIDE 25 MG PO TABS
25.0000 mg | ORAL_TABLET | Freq: Every day | ORAL | Status: DC
  Administered 2017-03-01 – 2017-03-02 (×2): 25 mg via ORAL
  Filled 2017-03-01 (×2): qty 1

## 2017-03-01 MED ORDER — ONDANSETRON HCL 4 MG PO TABS: 4.0000 mg | ORAL_TABLET | Freq: Three times a day (TID) | ORAL | Status: DC | PRN

## 2017-03-01 NOTE — Progress Notes (Signed)
Patient has a care plan which states her ACT team needs to be notified to come and take care of her psychiatric needs as she often comes to the ED when she is upset and reports being suicidal.    Waylan Boga, PMHNP

## 2017-03-01 NOTE — ED Triage Notes (Addendum)
Pt brought by Education officer, museum, reported that patient expressed SI  Depressed , lives with mother and looking for housing. Per Education officer, museum pt has a plan overdosing of her meds. Alert and oriented x4.   Social worker providing crisis number [phone].

## 2017-03-01 NOTE — ED Provider Notes (Signed)
[facility] DEPT Provider Note   CSN: [id number] Arrival date & time: 03/01/17  1204     History   Chief Complaint Chief Complaint  Patient presents with  . Suicidal    HPI [name] is a 26 y.o. female with a PMHx of anxiety, DM2, chronic abd pain, bipolar disorder, HTN, borderline intellectual disability, and other conditions listed below, who presents to the ED with complaints of suicidal ideations with a plan to overdose. She states that she is tired of living with her mom, wants to go to a group home, and this has caused her to feel more depressed. She denies HI, AVH, was drug use, alcohol use, or tobacco use. She is compliant with her medications, including BuSpar 15 mg 3 times a day, Cymbalta 60 mg daily, Luvox 120 mg at bedtime, and Invega IM 234 mg monthly. She took her BuSpar, Cymbalta, and Luvox last night, she has not had them today. She last had her Invega shot at the end of last month. She is also compliant with her metformin, glipizide, and GERD medication although hasn't taken them today. She sees a Teacher, music at [employer]. She has no medical complaints at this time and is here voluntarily.   The history is provided by the patient and medical records. No language interpreter was used.  Mental Health Problem  Presenting symptoms: suicidal thoughts   Presenting symptoms: no hallucinations and no homicidal ideas   Onset quality:  Gradual Timing:  Constant Progression:  Unchanged Chronicity:  Recurrent Context: stressful life event   Treatment compliance:  All of the time Time since last psychoactive medication taken:  1 day Relieved by:  None tried Worsened by:  Family interactions Ineffective treatments:  None tried Associated symptoms: no abdominal pain and no chest pain   Risk factors: hx of mental illness and recent psychiatric admission     Past Medical History:  Diagnosis Date  . Anxiety   . Bipolar 1 disorder ([place])   .  Cognitive deficits   . Depression   . Diabetes mellitus without complication ([place])   . Hypertension   . Mental disorder   . Obesity     Patient Active Problem List   Diagnosis Date Noted  . LGSIL on Pap smear of cervix 02/01/2017  . OCD (obsessive compulsive disorder) 10/05/2016  . Major depressive disorder, recurrent episode, mild ([place]) 05/04/2016  . 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 Learning disability 07/18/2015  . 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 Diabetes mellitus ([place]) 07/18/2015  . 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 Hyperlipidemia 07/18/2015  . Severe episode of recurrent major depressive disorder, without psychotic features ([place])   . Drug overdose   . Cognitive deficits 10/12/2012  . Generalized anxiety disorder 06/28/2012    Past Surgical History:  Procedure Laterality Date  . CESAREAN SECTION    . TONSILLECTOMY      OB History    Gravida Para Term Preterm AB Living   3 3 3  0 0 3   SAB TAB Ectopic Multiple Live Births   0 0 0 0 3       Home Medications    Prior to Admission medications   Medication Sig Start Date End Date Taking? Authorizing Provider  aspirin-acetaminophen-caffeine (EXCEDRIN MIGRAINE) [id number] MG tablet Take 2 tablets by mouth every 6 (six) hours as needed for headache or migraine.    [provider]  busPIRone (BUSPAR) 15 MG tablet Take 1  tablet (15 mg total) by mouth 2 (two) times daily. For anxiety 10/06/16   Lindell Spar I, NP  esomeprazole (NEXIUM) 40 MG capsule Take ONE CAPSULE once a day 30 days 02/24/17   [provider]  fluvoxaMINE (LUVOX) 50 MG tablet Take 150 mg by mouth at bedtime. 02/02/17   [provider]  glipiZIDE (GLUCOTROL XL) 10 MG 24 hr tablet Take 1 tablet (10 mg total) by mouth every evening. For diabetes management 10/06/16   Lindell Spar I, NP  hydrochlorothiazide (HYDRODIURIL) 25 MG tablet Take 1 TABLET BY MOUTH EVERY  DAY for 30 days 02/15/17   [provider]  ibuprofen (ADVIL,MOTRIN) 600 MG tablet Take 1 tablet (600 mg total) by mouth every 6 (six) hours as needed. 02/12/17   Rasch, Anderson Malta I, NP  ketorolac (TORADOL) 10 MG tablet Take 1 tablet (10 mg total) by mouth every 6 (six) hours as needed. Patient not taking: Reported on 02/25/2017 02/20/17   Keitha Butte, CNM  LEVEMIR FLEXTOUCH 100 UNIT/ML Pen Inject 10 units at bedtime 1500/45=34 (Max use 45 units daily) 02/10/17   [provider]  metFORMIN (GLUCOPHAGE) 1000 MG tablet Take 1 TABLET BY MOUTH TWICE DAILY with breakfast and supper 02/02/17   [provider]  metFORMIN (GLUCOPHAGE) 500 MG tablet Take 1 tablet (500 mg total) by mouth 2 (two) times daily with a meal. For diabetes management Patient not taking: Reported on 02/25/2017 10/06/16   Lindell Spar I, NP  omeprazole (PRILOSEC) 20 MG capsule Take 1 capsule (20 mg total) by mouth daily. For acid reflux Patient taking differently: Take 20 mg by mouth daily as needed. For acid reflux 10/06/16   Lindell Spar I, NP  ondansetron (ZOFRAN ODT) 4 MG disintegrating tablet Take 1 tablet (4 mg total) by mouth every 8 (eight) hours as needed for nausea or vomiting. 02/20/17   Keitha Butte, CNM  traMADol (ULTRAM) 50 MG tablet Take 1 tablet (50 mg total) by mouth every 6 (six) hours as needed. Patient not taking: Reported on 02/25/2017 02/12/17   Rasch, Artist Pais, NP    Family History Family History  Problem Relation Age of Onset  . Hypertension Mother   . Diabetes Father     Social History Social History   Tobacco Use  . Smoking status: Former Smoker    Packs/day: 1.00    Years: 7.00    Pack years: 7.00    Types: Cigarettes    Last attempt to quit: 02/14/2017    Years since quitting: 0.0  . Smokeless tobacco: Never Used  Substance Use Topics  . Alcohol use: No  . Drug use: No    Comment: Patient denies     Allergies   Wellbutrin [bupropion]; Omnipaque  [iohexol]; and Penicillins   Review of Systems Review of Systems  Constitutional: Negative for chills and fever.  Respiratory: Negative for shortness of breath.   Cardiovascular: Negative for chest pain.  Gastrointestinal: Negative for abdominal pain, constipation, diarrhea, nausea and vomiting.  Genitourinary: Negative for dysuria and hematuria.  Musculoskeletal: Negative for arthralgias and myalgias.  Skin: Negative for color change.  Allergic/Immunologic: Positive for immunocompromised state (DM2).  Neurological: Negative for weakness and numbness.  Psychiatric/Behavioral: Positive for suicidal ideas. Negative for confusion, hallucinations and homicidal ideas.   All other systems reviewed and are negative for acute change except as noted in the HPI.    Physical Exam Updated Vital Signs BP 135/88 (BP Location: Right Arm)   Pulse (!) 110  Temp 98.1 F (36.7 C) (Oral)   Resp 20   SpO2 96%   Physical Exam  Constitutional: She is oriented to person, place, and time. Vital signs are normal. She appears well-developed and well-nourished.  Non-toxic appearance. No distress.  Afebrile, nontoxic, NAD  HENT:  Head: Normocephalic and atraumatic.  Mouth/Throat: Oropharynx is clear and moist and mucous membranes are normal.  Eyes: Conjunctivae and EOM are normal. Right eye exhibits no discharge. Left eye exhibits no discharge.  Neck: Normal range of motion. Neck supple.  Cardiovascular: Normal rate, regular rhythm, normal heart sounds and intact distal pulses. Exam reveals no gallop and no friction rub.  No murmur heard. Tachycardic in triage, but resolved upon exam  Pulmonary/Chest: Effort normal and breath sounds normal. No respiratory distress. She has no decreased breath sounds. She has no wheezes. She has no rhonchi. She has no rales.  Abdominal: Soft. Normal appearance and bowel sounds are normal. She exhibits no distension. There is no tenderness. There is no rigidity, no rebound,  no guarding, no CVA tenderness, no tenderness at McBurney's point and negative Murphy's sign.  Musculoskeletal: Normal range of motion.  Neurological: She is alert and oriented to person, place, and time. She has normal strength. No sensory deficit.  Skin: Skin is warm, dry and intact. No rash noted.  Psychiatric: Her affect is blunt. She is not actively hallucinating. She exhibits a depressed mood. She expresses suicidal ideation. She expresses no homicidal ideation. She expresses suicidal plans. She expresses no homicidal plans.  Blunt depressed affect, but pleasant and cooperative. Endorsing SI with a plan, denies HI or AVH, doesn't seem to be responding to internal stimuli.   Nursing note and vitals reviewed.    ED Treatments / Results  Labs (all labs ordered are listed, but only abnormal results are displayed) Labs Reviewed  COMPREHENSIVE METABOLIC PANEL - Abnormal; Notable for the following components:      Result Value   Potassium 3.1 (*)    Chloride 95 (*)    Glucose, Bld 268 (*)    All other components within normal limits  ACETAMINOPHEN LEVEL - Abnormal; Notable for the following components:   Acetaminophen (Tylenol), Serum <10 (*)    All other components within normal limits  CBG MONITORING, ED - Abnormal; Notable for the following components:   Glucose-Capillary 300 (*)    All other components within normal limits  CBC WITH DIFFERENTIAL/PLATELET  ETHANOL  SALICYLATE LEVEL  RAPID URINE DRUG SCREEN, HOSP PERFORMED  I-STAT BETA HCG BLOOD, ED ([facility], [facility], [facility] ONLY)    EKG  EKG Interpretation None       Radiology No results found.  Procedures Procedures (including critical care time)  Medications Ordered in ED Medications  busPIRone (BUSPAR) tablet 15 mg (not administered)  DULoxetine (CYMBALTA) DR capsule 60 mg (not administered)  fluvoxaMINE (LUVOX) tablet 125 mg (not administered)  glipiZIDE (GLUCOTROL XL) 24 hr tablet 10 mg (not administered)    hydrochlorothiazide (HYDRODIURIL) tablet 25 mg (not administered)  hydrOXYzine (VISTARIL) capsule 25 mg (not administered)  Insulin Detemir (LEVEMIR) FlexPen 10 Units (not administered)  metFORMIN (GLUCOPHAGE) tablet 500 mg (not administered)  pantoprazole (PROTONIX) EC tablet 40 mg (not administered)  potassium chloride SA (K-DUR,KLOR-CON) CR tablet 60 mEq (not administered)  acetaminophen (TYLENOL) tablet 650 mg (not administered)  zolpidem (AMBIEN) tablet 5 mg (not administered)  ondansetron (ZOFRAN) tablet 4 mg (not administered)  alum & mag hydroxide-simeth (MAALOX/MYLANTA) 200-200-20 MG/5ML suspension 30 mL (not administered)     Initial  Impression / Assessment and Plan / ED Course  I have reviewed the triage vital signs and the nursing notes.  Pertinent labs & imaging results that were available during my care of the patient were reviewed by me and considered in my medical decision making (see chart for details).     26 y.o. female here with SI with plan to OD, denies HI/AVH, EtOH use, illicit drug use, tobacco use, or any other medical complaints at this time. Here voluntarily, states she's tired of living with her mom, wants to go to a group home. Depressed affect but otherwise exam benign. Will get psych clearance labs and reassess shortly.   3:56 PM CBC w/diff unremarkable. CMP with mildly low K 3.1, will replete here; hyperglycemia 268 but no anion gap or evidence of DKA, will restart home diabetes regimen. EtOH level undetectable. Salicylate and acetaminophen levels WNL. BetaHCG neg. UDS pending, but does not interfere with med clearance. Pt medically cleared at this time. Psych hold orders and home med orders placed. Please see TTS notes for further documentation of care/dispo. PLEASE NOTE THAT PT IS HERE VOLUNTARILY AT THIS TIME, IF PT TRIES TO LEAVE THEY WOULD NEED IVC PAPERWORK TAKEN OUT. Pt stable at time of med clearance.     Final Clinical Impressions(s) / ED Diagnoses    Final diagnoses:  Suicidal ideation  Medical clearance for psychiatric admission  Hypokalemia  Type 2 diabetes mellitus with hyperglycemia, with long-term current use of insulin [facility])    ED Discharge Orders    [address] [place], [place] 03/01/17 Reform, Kevin, MD 03/01/17 1752

## 2017-03-01 NOTE — Progress Notes (Addendum)
CSW received a call from TTS stating pt has a CARE PLAN stating CSW consult needs to be in place to contact [name] for transport at D/C.  Per [facility] NP note:  "Patient has a care plan which states her ACT team needs to be notified to come and take care of her psychiatric needs as she often comes to the ED when she is upset and reports being suicidal."  TTS requested CSW contact ACTT team and report pt is ready for D/C.  TTS stated pt is ready for D/C but per CARE PLAN pt must be seen by ACTT team at the ED for services and transportation needs due to pt's high number of returns to the ED.  TTS states pt may remain in the ED overnight if necessary to insure pt is connected to her ACTT support in the community before D/C.  CSW noted per chart from 10/05/16 pt has or has had in the past a Transitional Care Team with Myles Gip (with TCT) at [facility] and per notes call: [phone] for help in arranging an appointment and for assistance with the patient.  8:07 PM CSW did not see the name of an ACTT team associated with the pt on the pt's chart.  CSW will speak to the pt to ascertain.  CSW will continue to follow.  8:55 PM Pt stated she does not have an ACTT team but has a "CTS team". This Probation officer thinks pt may mean to say a CST team [employer] scientist) with a company off of [address].  9:01 PM   Pt provided CSW with the phone number of her [employer] CST [facility] Salencio Thompson at ph: [phone] and pt gave verbal consent for the CSW to call Mr. Ander Purpura.  CSW spoke to Mr. Grandville Silos of [employer]) who stated he could pick the pt up at approx 10:30-11am on 03/02/17 in order to provide CST services and provide transportation for the pt back to her home.  CSW educated Mr. Grandville Silos he could arrive to the ED and inform registration he was here to pick up the pt and if he had any difficulties in gaining access to the ED TCU  he could contact the 1st shift ED CSW at  [phone].  CSW presented this plan to the pt and pt was agreeable.  CSW left a handoff for the 1st shift ED CSW.  Please reconsult if future social work needs arise.  CSW signing off, as social work intervention is no longer needed.   Alphonse Guild. Antonios Ostrow, LCSW, LCAS, CSI Clinical Social Worker Ph: [phone]

## 2017-03-01 NOTE — ED Notes (Signed)
Bed: WA30 Expected date:  Expected time:  Means of arrival:  Comments: 

## 2017-03-02 LAB — CBG MONITORING, ED: Glucose-Capillary: 294 mg/dL — ABNORMAL HIGH (ref 65–99)

## 2017-03-02 NOTE — ED Notes (Signed)
Pt discharged safely with ACTT member.  Pt was in no distress.  Denied S/I and H/I.  All belongings were returned to pt.  Discharge instructions were reviewed .

## 2017-03-02 NOTE — ED Notes (Signed)
Pt presents with depression and SI, plan to OD on meds.  Pt denies at present.  A&O x 3, no distress noted, calm & cooperative.  Monitoring for safety, Q 15 min checks in effect. Safety check for contraband completed, no items found.

## 2017-03-03 ENCOUNTER — Encounter: Payer: Self-pay | Admitting: Obstetrics and Gynecology

## 2017-03-03 ENCOUNTER — Ambulatory Visit (INDEPENDENT_AMBULATORY_CARE_PROVIDER_SITE_OTHER): Payer: Medicaid Other | Admitting: Obstetrics and Gynecology

## 2017-03-03 ENCOUNTER — Other Ambulatory Visit (HOSPITAL_COMMUNITY)
Admission: RE | Admit: 2017-03-03 | Payer: Medicaid Other | Source: Ambulatory Visit | Admitting: Obstetrics and Gynecology

## 2017-03-03 ENCOUNTER — Other Ambulatory Visit (HOSPITAL_COMMUNITY)
Admission: RE | Admit: 2017-03-03 | Discharge: 2017-03-03 | Disposition: A | Discharge status: Home / Self Care | Payer: Medicaid Other | Source: Ambulatory Visit | Attending: Obstetrics and Gynecology | Admitting: Obstetrics and Gynecology

## 2017-03-03 VITALS — BP 133/103 | HR 108 | Wt 252.4 lb

## 2017-03-03 DIAGNOSIS — R87612 Low grade squamous intraepithelial lesion on cytologic smear of cervix (LGSIL): Secondary | ICD-10-CM

## 2017-03-03 LAB — POCT PREGNANCY, URINE: Preg Test, Ur: NEGATIVE

## 2017-03-03 NOTE — Progress Notes (Signed)
26 yo G3P3 with LGSIL here for colposcopy  Patient given informed consent, signed copy in the chart, time out was performed.  Placed in lithotomy position. Cervix viewed with speculum and colposcope after application of acetic acid.   Colposcopy adequate?  yes Acetowhite lesions? Yes 4-8 o'clock Punctation? no Mosaicism?  no Abnormal vasculature?  no Biopsies? Yes 4 and 8 o'clock ECC? yes  COMMENTS:  Patient was given post procedure instructions.  She will return in 2 weeks for results.  [name], MD

## 2017-03-10 ENCOUNTER — Telehealth: Payer: Self-pay

## 2017-03-10 NOTE — Telephone Encounter (Signed)
Notified pt of colpo results.  Per Kerry Hough, PA pt needs a pap smear in one year.  Informed pt of provider's recommendations.  Pt stated understanding with no further questions.

## 2017-04-30 ENCOUNTER — Inpatient Hospital Stay (HOSPITAL_COMMUNITY)
Admission: AD | Admit: 2017-04-30 | Discharge: 2017-04-30 | Discharge status: Against Medical Advice | Payer: Medicaid Other | Source: Ambulatory Visit | Attending: Obstetrics & Gynecology | Admitting: Obstetrics & Gynecology

## 2017-04-30 DIAGNOSIS — Z5321 Procedure and treatment not carried out due to patient leaving prior to being seen by health care provider: Secondary | ICD-10-CM | POA: Insufficient documentation

## 2017-04-30 DIAGNOSIS — N939 Abnormal uterine and vaginal bleeding, unspecified: Secondary | ICD-10-CM | POA: Insufficient documentation

## 2017-04-30 LAB — URINALYSIS, ROUTINE W REFLEX MICROSCOPIC
BILIRUBIN URINE: NEGATIVE
Bacteria, UA: NONE SEEN
Glucose, UA: >=500 mg/dL — AB
Ketones, ur: NEGATIVE mg/dL
Leukocytes, UA: NEGATIVE
NITRITE: NEGATIVE
Protein, ur: NEGATIVE mg/dL
SPECIFIC GRAVITY, URINE: 1.028 (ref 1.005–1.030)
pH: 6 (ref 5.0–8.0)

## 2017-04-30 LAB — POCT PREGNANCY, URINE: PREG TEST UR: NEGATIVE

## 2017-04-30 NOTE — MAU Note (Signed)
Call for patient - patient not in lobby.

## 2017-04-30 NOTE — MAU Note (Signed)
Pt. Called from lobby - no answer.

## 2017-04-30 NOTE — MAU Note (Signed)
Pt reports vaginal bleeding off/on for one month.

## 2017-04-30 NOTE — MAU Note (Addendum)
Patient called from lobby - no answer.  Lab also on the unit to call patient from lobby - no answer.

## 2017-05-11 ENCOUNTER — Emergency Department (HOSPITAL_COMMUNITY)
Admission: EM | Admit: 2017-05-11 | Discharge: 2017-05-11 | Disposition: A | Discharge status: Home / Self Care | Payer: Medicaid Other | Attending: Emergency Medicine | Admitting: Emergency Medicine

## 2017-05-11 ENCOUNTER — Encounter (HOSPITAL_COMMUNITY): Payer: Self-pay | Admitting: Emergency Medicine

## 2017-05-11 DIAGNOSIS — Z5321 Procedure and treatment not carried out due to patient leaving prior to being seen by health care provider: Secondary | ICD-10-CM | POA: Diagnosis not present

## 2017-05-11 DIAGNOSIS — N939 Abnormal uterine and vaginal bleeding, unspecified: Secondary | ICD-10-CM | POA: Diagnosis not present

## 2017-05-11 NOTE — ED Notes (Signed)
Called patient for a room with no answer.

## 2017-05-11 NOTE — ED Triage Notes (Signed)
Per EMS-states vaginal pain with dark brown bloody discharge-states it has been going on for a month

## 2017-05-11 NOTE — ED Notes (Signed)
Called patient for assigned room with no answer.  

## 2017-05-14 ENCOUNTER — Emergency Department (HOSPITAL_COMMUNITY)
Admission: EM | Admit: 2017-05-14 | Discharge: 2017-05-14 | Disposition: A | Discharge status: Home / Self Care | Payer: Medicaid Other | Attending: Emergency Medicine | Admitting: Emergency Medicine

## 2017-05-14 ENCOUNTER — Other Ambulatory Visit: Payer: Self-pay

## 2017-05-14 DIAGNOSIS — Z5321 Procedure and treatment not carried out due to patient leaving prior to being seen by health care provider: Secondary | ICD-10-CM | POA: Insufficient documentation

## 2017-05-14 DIAGNOSIS — N939 Abnormal uterine and vaginal bleeding, unspecified: Secondary | ICD-10-CM | POA: Diagnosis not present

## 2017-05-14 DIAGNOSIS — K625 Hemorrhage of anus and rectum: Secondary | ICD-10-CM | POA: Diagnosis not present

## 2017-05-14 LAB — BASIC METABOLIC PANEL
Anion gap: 12 (ref 5–15)
BUN: 11 mg/dL (ref 6–20)
CALCIUM: 9.7 mg/dL (ref 8.9–10.3)
CO2: 25 mmol/L (ref 22–32)
Chloride: 102 mmol/L (ref 101–111)
Creatinine, Ser: 0.76 mg/dL (ref 0.44–1.00)
GFR calc Af Amer: >60 mL/min (ref >60)
GLUCOSE: 239 mg/dL — AB (ref 65–99)
POTASSIUM: 4.3 mmol/L (ref 3.5–5.1)
Sodium: 139 mmol/L (ref 135–145)

## 2017-05-14 LAB — CBC WITH DIFFERENTIAL/PLATELET
Basophils Absolute: 0 10*3/uL (ref 0.0–0.1)
Basophils Relative: 0 %
EOS PCT: 3 %
Eosinophils Absolute: 0.3 10*3/uL (ref 0.0–0.7)
HEMATOCRIT: 41.4 % (ref 36.0–46.0)
Hemoglobin: 13.6 g/dL (ref 12.0–15.0)
LYMPHS ABS: 3.2 10*3/uL (ref 0.7–4.0)
LYMPHS PCT: 37 %
MCH: 26.8 pg (ref 26.0–34.0)
MCHC: 32.9 g/dL (ref 30.0–36.0)
MCV: 81.5 fL (ref 78.0–100.0)
MONO ABS: 0.4 10*3/uL (ref 0.1–1.0)
Monocytes Relative: 4 %
Neutro Abs: 4.7 10*3/uL (ref 1.7–7.7)
Neutrophils Relative %: 56 %
PLATELETS: 204 10*3/uL (ref 150–400)
RBC: 5.08 MIL/uL (ref 3.87–5.11)
RDW: 15.2 % (ref 11.5–15.5)
WBC: 8.6 10*3/uL (ref 4.0–10.5)

## 2017-05-14 NOTE — ED Triage Notes (Signed)
Pt reports vaginal bleeding (dark blood) and rectal bleeding x2 months (bright red). States she has been to [facility] hospital for vaginal bleeding before and was told she has fibroids. Has not been seen for rectal bleeding.

## 2017-05-14 NOTE — ED Notes (Signed)
PA Sofia in to assess patient

## 2017-05-14 NOTE — ED Notes (Signed)
Pt does not answer when called, will take out of Epic as LWBS after triage

## 2017-05-14 NOTE — ED Provider Notes (Cosign Needed)
Patient placed in Quick Look pathway, seen and evaluated   Chief Complaint: vaginal and rectal bleeding.   HPI:  Pt reports she has been seen here and at [facility] for vaginal bleeding.  Pt also noticed blood in her stool  Pt complains of feeling weak and dizzy.  Pt reports using multiple pads.  No shortness of breath    ROS: no ut symptoms.  No pregnancy  risk, no std risk  Physical Exam:   Gen: No distress  Neuro: Awake and Alert  Skin: Warm    Focused Exam: Lungs clear, Heart rrr   Initiation of care has begun. The patient has been counseled on the process, plan, and necessity for staying for the completion/evaluation, and the remainder of the medical screening examination   Fransico Meadow, PA-C 05/14/17 1122

## 2017-05-14 NOTE — ED Notes (Signed)
Called pt x2 for vitals, no response. °

## 2017-05-21 ENCOUNTER — Other Ambulatory Visit: Payer: Self-pay

## 2017-05-21 ENCOUNTER — Encounter (HOSPITAL_COMMUNITY): Payer: Self-pay | Admitting: Emergency Medicine

## 2017-05-21 DIAGNOSIS — Z79899 Other long term (current) drug therapy: Secondary | ICD-10-CM | POA: Insufficient documentation

## 2017-05-21 DIAGNOSIS — R102 Pelvic and perineal pain: Secondary | ICD-10-CM | POA: Diagnosis not present

## 2017-05-21 DIAGNOSIS — E119 Type 2 diabetes mellitus without complications: Secondary | ICD-10-CM | POA: Insufficient documentation

## 2017-05-21 DIAGNOSIS — Z794 Long term (current) use of insulin: Secondary | ICD-10-CM | POA: Diagnosis not present

## 2017-05-21 DIAGNOSIS — Z87891 Personal history of nicotine dependence: Secondary | ICD-10-CM | POA: Diagnosis not present

## 2017-05-21 DIAGNOSIS — I10 Essential (primary) hypertension: Secondary | ICD-10-CM | POA: Diagnosis not present

## 2017-05-21 NOTE — ED Triage Notes (Signed)
Patient is complaining of vaginal pain and bleeding for two months. No other complaints.

## 2017-05-22 ENCOUNTER — Emergency Department (HOSPITAL_COMMUNITY)
Admission: EM | Admit: 2017-05-22 | Discharge: 2017-05-22 | Disposition: A | Discharge status: Home / Self Care | Payer: Medicaid Other | Attending: Emergency Medicine | Admitting: Emergency Medicine

## 2017-05-22 ENCOUNTER — Encounter (HOSPITAL_COMMUNITY): Payer: Self-pay | Admitting: Emergency Medicine

## 2017-05-22 DIAGNOSIS — R102 Pelvic and perineal pain: Secondary | ICD-10-CM

## 2017-05-22 LAB — URINALYSIS, ROUTINE W REFLEX MICROSCOPIC
Bilirubin Urine: NEGATIVE
Glucose, UA: 100 mg/dL — AB
Hgb urine dipstick: NEGATIVE
LEUKOCYTES UA: NEGATIVE
NITRITE: NEGATIVE
PH: 5 (ref 5.0–8.0)
PROTEIN: 30 mg/dL — AB
Specific Gravity, Urine: >1.03 — ABNORMAL HIGH (ref 1.005–1.030)

## 2017-05-22 LAB — URINALYSIS, MICROSCOPIC (REFLEX)
Bacteria, UA: NONE SEEN
RBC / HPF: NONE SEEN RBC/hpf (ref 0–5)
Squamous Epithelial / LPF: NONE SEEN

## 2017-05-22 LAB — PREGNANCY, URINE: Preg Test, Ur: NEGATIVE

## 2017-05-22 MED ORDER — LIDOCAINE HCL 2 % EX GEL
1.0000 "application " | Freq: Once | CUTANEOUS | Status: AC
  Administered 2017-05-22: 1 via TOPICAL
  Filled 2017-05-22: qty 5

## 2017-05-22 NOTE — ED Provider Notes (Signed)
[facility] DEPT Provider Note   CSN: [id number] Arrival date & time: 05/21/17  2248     History   Chief Complaint No chief complaint on file.   HPI [name] is a 27 y.o. female.  The history is provided by the patient.  Illness  This is a chronic problem. The current episode started more than 1 week ago. The problem occurs constantly. The problem has not changed since onset.Pertinent negatives include no chest pain, no abdominal pain, no headaches and no shortness of breath. Nothing aggravates the symptoms. Nothing relieves the symptoms. She has tried nothing for the symptoms. The treatment provided no relief.  Pain in the area of her mons pubis.  Has been seen for same in the past.    Past Medical History:  Diagnosis Date  . Anxiety   . Bipolar 1 disorder ([place])   . Cognitive deficits   . Depression   . Diabetes mellitus without complication ([place])   . Hypertension   . Mental disorder   . Obesity     Patient Active Problem List   Diagnosis Date Noted  . LGSIL on Pap smear of cervix 02/01/2017  . OCD (obsessive compulsive disorder) 10/05/2016  . Major depressive disorder, recurrent episode, mild ([place]) 05/04/2016  . 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 Learning disability 07/18/2015  . 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 Diabetes mellitus ([place]) 07/18/2015  . 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 Hyperlipidemia 07/18/2015  . Severe episode of recurrent major depressive disorder, without psychotic features ([place])   . Drug overdose   . Cognitive deficits 10/12/2012  . Generalized anxiety disorder 06/28/2012    Past Surgical History:  Procedure Laterality Date  . CESAREAN SECTION    . CESAREAN SECTION N/A 04/25/2013   Procedure: REPEAT CESAREAN SECTION;  Surgeon: [name], MD;  Location: [facility];  Service: Obstetrics;  Laterality: N/A;  . MASS EXCISION N/A  06/03/2012   Procedure: EXCISION MASS;  Surgeon: [name], MD;  Location: [facility];  Service: ENT;  Laterality: N/A;  Excision uvula mass  . TONSILLECTOMY N/A 06/03/2012   Procedure: TONSILLECTOMY;  Surgeon: [name], MD;  Location: [facility];  Service: ENT;  Laterality: N/A;  . TONSILLECTOMY      OB History    Gravida Para Term Preterm AB Living   3 3 3  0 0 3   SAB TAB Ectopic Multiple Live Births   0 0 0 0 3       Home Medications    Prior to Admission medications   Medication Sig Start Date End Date Taking? Authorizing Provider  busPIRone (BUSPAR) 15 MG tablet Take 1 tablet (15 mg total) by mouth 2 (two) times daily. For anxiety 10/06/16   Lindell Spar I, NP  DULoxetine (CYMBALTA) 60 MG capsule Take 60 mg daily by mouth.    [provider]  fluvoxaMINE (LUVOX) 50 MG tablet Take 120 mg at bedtime by mouth.  02/02/17   [provider]  glipiZIDE (GLUCOTROL XL) 10 MG 24 hr tablet Take 1 tablet (10 mg total) by mouth every evening. For diabetes management 10/06/16   Lindell Spar I, NP  hydrochlorothiazide (HYDRODIURIL) 25 MG tablet Take 1 TABLET BY MOUTH EVERY DAY for 30 days 02/15/17   [provider]  hydrOXYzine (VISTARIL) 25 MG capsule Take 25 mg 3 (three) times daily as needed by mouth. "to shave my hair" 02/02/17   [provider]  ibuprofen (ADVIL,MOTRIN) 600 MG tablet Take 1 tablet (600 mg total) by mouth every 6 (six) hours as needed. Patient not taking: Reported on 03/01/2017 02/12/17   Rasch, Anderson Malta I, NP  ketorolac (TORADOL) 10 MG tablet Take 1 tablet (10 mg total) by mouth every 6 (six) hours as needed. Patient not taking: Reported on 02/25/2017 02/20/17   Keitha Butte, CNM  LEVEMIR FLEXTOUCH 100 UNIT/ML Pen Inject 10 units at bedtime 1500/45=34 (Max use 45 units daily) 02/10/17   [provider]  metFORMIN (GLUCOPHAGE) 500 MG tablet Take 1 tablet (500 mg total) by mouth 2 (two) times  daily with a meal. For diabetes management 10/06/16   Lindell Spar I, NP  omeprazole (PRILOSEC) 20 MG capsule Take 1 capsule (20 mg total) by mouth daily. For acid reflux 10/06/16   Lindell Spar I, NP  ondansetron (ZOFRAN ODT) 4 MG disintegrating tablet Take 1 tablet (4 mg total) by mouth every 8 (eight) hours as needed for nausea or vomiting. Patient not taking: Reported on 03/01/2017 02/20/17   Keitha Butte, CNM  paliperidone (INVEGA SUSTENNA) 234 MG/1.5ML SUSP injection Inject 234 mg every 30 (thirty) days into the muscle.    [provider]  traMADol (ULTRAM) 50 MG tablet Take 1 tablet (50 mg total) by mouth every 6 (six) hours as needed. Patient not taking: Reported on 02/25/2017 02/12/17   Rasch, Artist Pais, NP    Family History Family History  Problem Relation Age of Onset  . Hypertension Mother   . Diabetes Father     Social History Social History   Tobacco Use  . Smoking status: Former Smoker    Packs/day: 1.00    Years: 7.00    Pack years: 7.00    Types: Cigarettes    Last attempt to quit: 02/14/2017    Years since quitting: 0.2  . Smokeless tobacco: Never Used  Substance Use Topics  . Alcohol use: No  . Drug use: No    Comment: Patient denies     Allergies   Wellbutrin [bupropion]; Omnipaque [iohexol]; and Penicillins   Review of Systems Review of Systems  Respiratory: Negative for shortness of breath.   Cardiovascular: Negative for chest pain.  Gastrointestinal: Negative for abdominal pain.  Genitourinary: Negative for dysuria, hematuria and vaginal discharge.  Neurological: Negative for headaches.  All other systems reviewed and are negative.    Physical Exam Updated Vital Signs BP 105/70   Pulse 97   Temp 98.9 F (37.2 C) (Oral)   Resp 18   Ht 5\' 7"  (1.702 m)   Wt 117.5 kg (259 lb)   SpO2 100%   BMI 40.57 kg/m   Physical Exam  Constitutional: She is oriented to person, place, and time. She appears well-developed and well-nourished.  No distress.  HENT:  Head: Normocephalic and atraumatic.  Nose: Nose normal.  Mouth/Throat: No oropharyngeal exudate.  Eyes: Conjunctivae are normal. Pupils are equal, round, and reactive to light.  Neck: Normal range of motion. Neck supple.  Cardiovascular: Normal rate, regular rhythm, normal heart sounds and intact distal pulses.  Pulmonary/Chest: Effort normal and breath sounds normal. No stridor. No respiratory distress. She has no wheezes. She has no rales.  Abdominal: Soft. Bowel sounds are normal. She exhibits no mass. There is no tenderness. There is no rebound and no guarding.  Genitourinary:  Genitourinary Comments: Hair bumps and mild abrasion from shaving.  No vaginal tears or bleeding chaperone present.    Musculoskeletal: Normal range of  motion.  Neurological: She is alert and oriented to person, place, and time. She displays normal reflexes.  Skin: Skin is warm and dry. Capillary refill takes less than 2 seconds.  Psychiatric: She has a normal mood and affect.     ED Treatments / Results  Labs (all labs ordered are listed, but only abnormal results are displayed)  Results for orders placed or performed during the hospital encounter of 05/22/17  Urinalysis, Routine w reflex microscopic  Result Value Ref Range   Color, Urine YELLOW YELLOW   APPearance CLOUDY (A) CLEAR   Specific Gravity, Urine >1.030 (H) 1.005 - 1.030   pH 5.0 5.0 - 8.0   Glucose, UA 100 (A) NEGATIVE mg/dL   Hgb urine dipstick NEGATIVE NEGATIVE   Bilirubin Urine NEGATIVE NEGATIVE   Ketones, ur TRACE (A) NEGATIVE mg/dL   Protein, ur 30 (A) NEGATIVE mg/dL   Nitrite NEGATIVE NEGATIVE   Leukocytes, UA NEGATIVE NEGATIVE  Pregnancy, urine  Result Value Ref Range   Preg Test, Ur NEGATIVE NEGATIVE  Urinalysis, Microscopic (reflex)  Result Value Ref Range   RBC / HPF NONE SEEN 0 - 5 RBC/hpf   WBC, UA 0-5 0 - 5 WBC/hpf   Bacteria, UA NONE SEEN NONE SEEN   Squamous Epithelial / LPF NONE SEEN NONE SEEN     Amorphous Crystal PRESENT    No results found.  Radiology No results found.  Procedures Procedures (including critical care time)  Medications Ordered in ED Medications  lidocaine (XYLOCAINE) 2 % jelly 1 application (1 application Topical Given 05/22/17 0542)      Final Clinical Impressions(s) / ED Diagnoses   Final diagnoses:  Vaginal pain    No shaving or using depilitories. Follow up with GYN for ongoing care.    Return for weakness, numbness, changes in vision or speech,  fevers > 100.4 unrelieved by medication, shortness of breath, intractable vomiting, or diarrhea, abdominal pain, Inability to tolerate liquids or food, cough, altered mental status or any concerns. No signs of systemic illness or infection. The patient is nontoxic-appearing on exam and vital signs are within normal limits.    I have reviewed the triage vital signs and the nursing notes. Pertinent labs &imaging results that were available during my care of the patient were reviewed by me and considered in my medical decision making (see chart for details).  After history, exam, and medical workup I feel the patient has been appropriately medically screened and is safe for discharge home. Pertinent diagnoses were discussed with the patient. Patient was given return precautions.      Cristo Ausburn, MD 05/22/17 [phone]

## 2017-05-28 ENCOUNTER — Other Ambulatory Visit: Payer: Self-pay

## 2017-05-28 ENCOUNTER — Emergency Department (HOSPITAL_COMMUNITY)
Admission: EM | Admit: 2017-05-28 | Discharge: 2017-05-28 | Disposition: A | Discharge status: Home / Self Care | Payer: Medicaid Other | Attending: Emergency Medicine | Admitting: Emergency Medicine

## 2017-05-28 ENCOUNTER — Encounter (HOSPITAL_COMMUNITY): Payer: Self-pay | Admitting: *Deleted

## 2017-05-28 DIAGNOSIS — N938 Other specified abnormal uterine and vaginal bleeding: Secondary | ICD-10-CM | POA: Insufficient documentation

## 2017-05-28 DIAGNOSIS — Z5321 Procedure and treatment not carried out due to patient leaving prior to being seen by health care provider: Secondary | ICD-10-CM | POA: Insufficient documentation

## 2017-05-28 LAB — I-STAT CHEM 8, ED
BUN: 7 mg/dL (ref 6–20)
CREATININE: 0.9 mg/dL (ref 0.44–1.00)
Calcium, Ion: 1.15 mmol/L (ref 1.15–1.40)
Chloride: 105 mmol/L (ref 101–111)
Glucose, Bld: 173 mg/dL — ABNORMAL HIGH (ref 65–99)
HEMATOCRIT: 41 % (ref 36.0–46.0)
Hemoglobin: 13.9 g/dL (ref 12.0–15.0)
Potassium: 3.4 mmol/L — ABNORMAL LOW (ref 3.5–5.1)
Sodium: 143 mmol/L (ref 135–145)
TCO2: 25 mmol/L (ref 22–32)

## 2017-05-28 LAB — I-STAT BETA HCG BLOOD, ED (MC, WL, AP ONLY): I-stat hCG, quantitative: <5 m[IU]/mL (ref <5)

## 2017-05-28 NOTE — Care Management (Signed)
ED CM noted patient to have had 15 ED visits in the past 6 months, without any hospital stays. Patient has Medicaid [place] [facility] [address]. Vista Lawman PCP. Patient is in the waiting room, CM will meet with patient to assist with transitional care planning once roomed.

## 2017-05-28 NOTE — ED Notes (Signed)
Lab work, radiology results and vital signs reviewed, no critical results at this time, no change in acuity indicated.  

## 2017-05-28 NOTE — ED Triage Notes (Addendum)
Pt arrived by [facility] for vaginal bleeding that has been occurring for over two months. Was seen at [facility] on 1/26 for same. Reports it is intermittent heavy bleeding, when not bleeding she has severe vaginal pain.

## 2017-05-28 NOTE — ED Notes (Signed)
Called pt. Pt did not answer

## 2017-05-28 NOTE — ED Notes (Signed)
Called pt for room no answer 

## 2017-06-18 ENCOUNTER — Other Ambulatory Visit: Payer: Self-pay

## 2017-06-18 ENCOUNTER — Encounter (HOSPITAL_COMMUNITY): Payer: Self-pay

## 2017-06-18 DIAGNOSIS — Z79899 Other long term (current) drug therapy: Secondary | ICD-10-CM | POA: Insufficient documentation

## 2017-06-18 DIAGNOSIS — I10 Essential (primary) hypertension: Secondary | ICD-10-CM | POA: Insufficient documentation

## 2017-06-18 DIAGNOSIS — E119 Type 2 diabetes mellitus without complications: Secondary | ICD-10-CM | POA: Diagnosis not present

## 2017-06-18 DIAGNOSIS — R451 Restlessness and agitation: Secondary | ICD-10-CM | POA: Insufficient documentation

## 2017-06-18 DIAGNOSIS — R45851 Suicidal ideations: Secondary | ICD-10-CM | POA: Insufficient documentation

## 2017-06-18 DIAGNOSIS — Z87891 Personal history of nicotine dependence: Secondary | ICD-10-CM | POA: Insufficient documentation

## 2017-06-18 DIAGNOSIS — F319 Bipolar disorder, unspecified: Secondary | ICD-10-CM | POA: Diagnosis not present

## 2017-06-18 LAB — CBC
HCT: 41.3 % (ref 36.0–46.0)
Hemoglobin: 13.8 g/dL (ref 12.0–15.0)
MCH: 27.4 pg (ref 26.0–34.0)
MCHC: 33.4 g/dL (ref 30.0–36.0)
MCV: 81.9 fL (ref 78.0–100.0)
PLATELETS: 225 10*3/uL (ref 150–400)
RBC: 5.04 MIL/uL (ref 3.87–5.11)
RDW: 14.6 % (ref 11.5–15.5)
WBC: 10.6 10*3/uL — AB (ref 4.0–10.5)

## 2017-06-18 LAB — COMPREHENSIVE METABOLIC PANEL
ALT: 30 U/L (ref 14–54)
ANION GAP: 13 (ref 5–15)
AST: 40 U/L (ref 15–41)
Albumin: 4.2 g/dL (ref 3.5–5.0)
Alkaline Phosphatase: 47 U/L (ref 38–126)
BUN: 10 mg/dL (ref 6–20)
CALCIUM: 9.9 mg/dL (ref 8.9–10.3)
CHLORIDE: 105 mmol/L (ref 101–111)
CO2: 22 mmol/L (ref 22–32)
CREATININE: 0.87 mg/dL (ref 0.44–1.00)
Glucose, Bld: 234 mg/dL — ABNORMAL HIGH (ref 65–99)
Potassium: 3.6 mmol/L (ref 3.5–5.1)
SODIUM: 140 mmol/L (ref 135–145)
Total Bilirubin: 0.8 mg/dL (ref 0.3–1.2)
Total Protein: 7.2 g/dL (ref 6.5–8.1)

## 2017-06-18 LAB — RAPID URINE DRUG SCREEN, HOSP PERFORMED
Amphetamines: NOT DETECTED
BENZODIAZEPINES: NOT DETECTED
Barbiturates: NOT DETECTED
Cocaine: NOT DETECTED
OPIATES: NOT DETECTED
Tetrahydrocannabinol: NOT DETECTED

## 2017-06-18 LAB — SALICYLATE LEVEL

## 2017-06-18 LAB — I-STAT BETA HCG BLOOD, ED (MC, WL, AP ONLY)

## 2017-06-18 LAB — ACETAMINOPHEN LEVEL

## 2017-06-18 LAB — ETHANOL

## 2017-06-18 MED ORDER — HALOPERIDOL LACTATE 5 MG/ML IJ SOLN
5.0000 mg | Freq: Once | INTRAMUSCULAR | Status: AC
  Administered 2017-06-18: 5 mg via INTRAMUSCULAR
  Filled 2017-06-18: qty 1

## 2017-06-18 MED ORDER — STERILE WATER FOR INJECTION IJ SOLN
INTRAMUSCULAR | Status: AC
  Filled 2017-06-18: qty 10

## 2017-06-18 NOTE — ED Notes (Signed)
Doyce Loose- case worker with psychotherapeutic services [phone]

## 2017-06-18 NOTE — ED Provider Notes (Signed)
Patient resents to the ED for SI thoughts after mom upset her.  Patient plans to overdose on pills.  Denies homicidal ideations.  Patient is very combative and loud in the ED.  Very agitated.  Patient history of same.  Reports noncompliance of medications.  Nursing staff is asked for chemical sedation.  EKG was reviewed from last year that showed normal QT.  Will give 5 mg of IM Haldol.  Patient will still need to be medically cleared by provider.  Vital signs are stable.   Doristine Devoid, PA-C 06/18/17 2046    Tegeler, Gwenyth Allegra, MD 06/18/17 [phone]

## 2017-06-18 NOTE — ED Triage Notes (Signed)
Pt comes for SI thoughts after her mom upset her, plans to overdose on pills, states that she likes needles also. Denies HI/AVH. Pt tearful stating she is tired of her life. States compliance with medications from [facility].

## 2017-06-18 NOTE — ED Notes (Signed)
Gave Pt a [place] Sandwich and Caffeine free Coke

## 2017-06-19 ENCOUNTER — Emergency Department (HOSPITAL_COMMUNITY)
Admission: EM | Admit: 2017-06-19 | Discharge: 2017-06-20 | Disposition: A | Discharge status: Home / Self Care | Payer: Medicaid Other | Attending: Emergency Medicine | Admitting: Emergency Medicine

## 2017-06-19 DIAGNOSIS — F329 Major depressive disorder, single episode, unspecified: Secondary | ICD-10-CM

## 2017-06-19 DIAGNOSIS — F32A Depression, unspecified: Secondary | ICD-10-CM

## 2017-06-19 DIAGNOSIS — R45851 Suicidal ideations: Secondary | ICD-10-CM

## 2017-06-19 LAB — CBG MONITORING, ED: Glucose-Capillary: 260 mg/dL — ABNORMAL HIGH (ref 65–99)

## 2017-06-19 MED ORDER — HYDROCHLOROTHIAZIDE 25 MG PO TABS
25.0000 mg | ORAL_TABLET | Freq: Every day | ORAL | Status: DC
  Administered 2017-06-19 – 2017-06-20 (×2): 25 mg via ORAL
  Filled 2017-06-19 (×2): qty 1

## 2017-06-19 MED ORDER — NICOTINE 21 MG/24HR TD PT24
21.0000 mg | MEDICATED_PATCH | Freq: Once | TRANSDERMAL | Status: DC
  Filled 2017-06-19: qty 1

## 2017-06-19 MED ORDER — HYDROXYZINE HCL 25 MG PO TABS
25.0000 mg | ORAL_TABLET | Freq: Three times a day (TID) | ORAL | Status: DC | PRN
  Filled 2017-06-19: qty 1

## 2017-06-19 MED ORDER — FLUVOXAMINE MALEATE 50 MG PO TABS
100.0000 mg | ORAL_TABLET | Freq: Every day | ORAL | Status: DC
  Administered 2017-06-19: 100 mg via ORAL
  Filled 2017-06-19: qty 2

## 2017-06-19 MED ORDER — METFORMIN HCL 500 MG PO TABS
500.0000 mg | ORAL_TABLET | Freq: Two times a day (BID) | ORAL | Status: DC
  Administered 2017-06-19 – 2017-06-20 (×3): 500 mg via ORAL
  Filled 2017-06-19 (×3): qty 1

## 2017-06-19 MED ORDER — GLIPIZIDE ER 10 MG PO TB24
10.0000 mg | ORAL_TABLET | Freq: Every day | ORAL | Status: DC
  Administered 2017-06-19: 10 mg via ORAL
  Filled 2017-06-19 (×2): qty 1

## 2017-06-19 MED ORDER — PANTOPRAZOLE SODIUM 40 MG PO TBEC
40.0000 mg | DELAYED_RELEASE_TABLET | Freq: Every day | ORAL | Status: DC
  Administered 2017-06-19 – 2017-06-20 (×2): 40 mg via ORAL
  Filled 2017-06-19 (×2): qty 1

## 2017-06-19 MED ORDER — DULOXETINE HCL 60 MG PO CPEP
60.0000 mg | ORAL_CAPSULE | Freq: Every day | ORAL | Status: DC
  Administered 2017-06-19 – 2017-06-20 (×2): 60 mg via ORAL
  Filled 2017-06-19 (×4): qty 1

## 2017-06-19 NOTE — ED Notes (Signed)
Pt requesting nicotine patch.

## 2017-06-19 NOTE — ED Notes (Signed)
Pt in F6 for TTS

## 2017-06-19 NOTE — BH Assessment (Addendum)
Tele Assessment Note   Patient Name: KATELIND PYTEL MRN: [id number] Referring Physician: Wyn Quaker PA-C Location of Patient: [facility] Location of Provider: [place] is an 27 y.o. female. Patient presented voluntarily last night brought in by her counselor with the [facility], Perrin Maltese [phone]. Patient is pleasant and oriented x 4. She says, "I was just bored." Pt goes on to say that she doesn't need to go to a psychiatric hospital.  She currently denies SI. Pt reports four prior suicide attempts by overdose. Per chart review, pt has been admitted to [facility] inpatient unit as a child and as an adult. Her most recent admission to [facility] was June 2018. Pt reports most recent psych admission was Oct 2018 at [facility].  She reports she is compliant with her psych meds she received from [facility]. Pt reports she has appointment with counselor at [facility] on 06/24/17. Pt reports she has appt in two days with her MD re: high blood sugar. She reports she has biological daughters who "are in adoption" (ages 85, 27 and 59). Pt currently denies any auditory or visual hallucinations. Pt denies homicidal thoughts or physical aggression. Pt denies having access to firearms. Pt denies having any legal problems at this time. Pt denies any current or past substance abuse problems. Pt does not appear to be intoxicated or in withdrawal at this time.  Collateral info provided by Perrin Maltese. Patree reports pt called her last night to say pt was suicidal. She says that they recently helped patient move into her own apartment. Patree says that patient doesn't have a tv or radio. She says that sometimes patient gets bored. Patree reports [employer] is working on getting her entertainment items for apartment. She says that pt has been recently reporting pt's meds aren't working effectively. She reports pt has occasionally had auditory hallucinations.   Diagnosis: Bipolar I  Disorder  Past Medical History:  Past Medical History:  Diagnosis Date  . Anxiety   . Bipolar 1 disorder ([place])   . Cognitive deficits   . Depression   . Diabetes mellitus without complication ([place])   . Hypertension   . Mental disorder   . Obesity     Past Surgical History:  Procedure Laterality Date  . CESAREAN SECTION    . CESAREAN SECTION N/A 04/25/2013   Procedure: REPEAT CESAREAN SECTION;  Surgeon: [name], MD;  Location: [facility];  Service: Obstetrics;  Laterality: N/A;  . MASS EXCISION N/A 06/03/2012   Procedure: EXCISION MASS;  Surgeon: [name], MD;  Location: [facility];  Service: ENT;  Laterality: N/A;  Excision uvula mass  . TONSILLECTOMY N/A 06/03/2012   Procedure: TONSILLECTOMY;  Surgeon: [name], MD;  Location: [facility];  Service: ENT;  Laterality: N/A;  . TONSILLECTOMY      Family History:  Family History  Problem Relation Age of Onset  . Hypertension Mother   . Diabetes Father     Social History:  reports that she quit smoking about 4 months ago. Her smoking use included cigarettes. She has a 7.00 pack-year smoking history. she has never used smokeless tobacco. She reports that she does not drink alcohol or use drugs.  Additional Social History:  Alcohol / Drug Use Pain Medications: pt denies abuse - see pta meds list Prescriptions: pt denies abuse - see pta meds list Over the Counter: pt denies abuse - see pta meds list History of alcohol /  drug use?: No history of alcohol / drug abuse Longest period of sobriety (when/how long): n/a  CIWA: CIWA-Ar BP: 130/86 Pulse Rate: 96 COWS:    Allergies:  Allergies  Allergen Reactions  . Wellbutrin [Bupropion] Shortness Of Breath  . Omnipaque [Iohexol] Swelling and Other (See Comments)    Reaction:  Eye swelling  . Penicillins Hives and Other (See Comments)    Has patient had a PCN reaction causing immediate rash, facial/tongue/throat swelling, SOB or  lightheadedness with hypotension: No Has patient had a PCN reaction causing severe rash involving mucus membranes or skin necrosis: No Has patient had a PCN reaction that required hospitalization No Has patient had a PCN reaction occurring within the last 10 years: No If all of the above answers are "NO", then may proceed with Cephalosporin use.    Home Medications:  (Not in a hospital admission)  OB/GYN Status:  No LMP recorded. Patient has had an implant.  General Assessment Data Assessment unable to be completed: Yes Reason for not completing assessment: TTS consult ordered at 06:18. Pt is currently in the hallway. Pt's nurse Magda Bernheim, RN states the assessment will have to be completed in the hallway. Advised the pt's nurse TTS cannot complete an assessment for a pt in the hallway due to confidentiality. Advised pt will have to be moved to a private room even if only temporarily for the assessment. Magda Bernheim, RN states she will not be able to move the pt to a private room for another 15-20 minutes. Denny Peon, RN pt will be assessed on day shift due to time constraints as it is now 06:23 and the pt is not ready to be seen at this time. Location of Assessment: [facility] ED TTS Assessment: In system Is this a Tele or Face-to-Face Assessment?: Tele Assessment Is this an Initial Assessment or a Re-assessment for this encounter?: Initial Assessment Marital status: Single Maiden name: tifffany Lahti Is patient pregnant?: No Pregnancy Status: No Living Arrangements: Alone(pt recenty moved into own apartment) Can pt return to current living arrangement?: Yes Admission Status: Voluntary Is patient capable of signing voluntary admission?: Yes Referral Source: Other([facility] kim patree [phone]) Insurance type: medicaid     Crisis Care Plan Living Arrangements: Alone(pt recenty moved into own apartment) Name of Psychiatrist: [name] Name of Therapist:  [facility]  Education Status Is patient currently in school?: No Highest grade of school patient has completed: 11  Risk to self with the past 6 months Suicidal Ideation: No Has patient been a risk to self within the past 6 months prior to admission? : Yes Suicidal Intent: No Has patient had any suicidal intent within the past 6 months prior to admission? : Yes Is patient at risk for suicide?: Yes Suicidal Plan?: No Has patient had any suicidal plan within the past 6 months prior to admission? : Yes Access to Means: Yes Specify Access to Suicidal Means: access to her meds What has been your use of drugs/alcohol within the last 12 months?: none Previous Attempts/Gestures: Yes How many times?: 4(by overdose) Other Self Harm Risks: none Triggers for Past Attempts: Unpredictable, Other personal contacts Intentional Self Injurious Behavior: None Family Suicide History: No Recent stressful life event(s): Other (Comment)(high blood sugar) Persecutory voices/beliefs?: No Depression: No Depression Symptoms: Feeling angry/irritable Substance abuse history and/or treatment for substance abuse?: No Suicide prevention information given to non-admitted patients: Not applicable  Risk to Others within the past 6 months Homicidal Ideation: No Does patient have any lifetime risk of  violence toward others beyond the six months prior to admission? : No Thoughts of Harm to Others: No Current Homicidal Intent: No Current Homicidal Plan: No Access to Homicidal Means: No Identified Victim: none History of harm to others?: No Assessment of Violence: None Noted Violent Behavior Description: pt denies history of violence Does patient have access to weapons?: No Criminal Charges Pending?: No Does patient have a court date: No Is patient on probation?: No  Psychosis Hallucinations: None noted Delusions: None noted  Mental Status Report Appearance/Hygiene: In scrubs, Unremarkable(shaved head) Eye  Contact: Good Motor Activity: Freedom of movement Speech: Logical/coherent Level of Consciousness: Alert Mood: ("a little bored" ) Affect: Other (Comment)(euthymic) Anxiety Level: None Thought Processes: Coherent, Relevant Judgement: Unimpaired Orientation: Person, Place, Time, Situation Obsessive Compulsive Thoughts/Behaviors: None  Cognitive Functioning Concentration: Normal Memory: Recent Intact, Remote Intact IQ: Average Insight: Poor Impulse Control: Fair Appetite: Good Sleep: No Change Total Hours of Sleep: 8 Vegetative Symptoms: None  ADLScreening [facility] Assessment Services) Patient's cognitive ability adequate to safely complete daily activities?: Yes Patient able to express need for assistance with ADLs?: Yes Independently performs ADLs?: Yes (appropriate for developmental age)  Prior Inpatient Therapy Prior Inpatient Therapy: Yes Prior Therapy Dates: 2006 off and on through 2018 Prior Therapy Facilty/Provider(s): [facility] (multiple), [facility], [facility] Reason for Treatment: bipolar disorder  Prior Outpatient Therapy Prior Outpatient Therapy: Yes Prior Therapy Dates: currently Prior Therapy Facilty/Provider(s): [facility] Reason for Treatment: med management and talk therapy Does patient have an ACCT team?: No Does patient have Intensive In-House Services?  : No Does patient have [facility] services? : Yes Does patient have P4CC services?: Unknown  ADL Screening (condition at time of admission) Patient's cognitive ability adequate to safely complete daily activities?: Yes Is the patient deaf or have difficulty hearing?: No Does the patient have difficulty seeing, even when wearing glasses/contacts?: No Does the patient have difficulty concentrating, remembering, or making decisions?: No Patient able to express need for assistance with ADLs?: Yes Does the patient have difficulty dressing or bathing?: No Independently performs ADLs?: Yes (appropriate  for developmental age) Does the patient have difficulty walking or climbing stairs?: No Weakness of Legs: None Weakness of Arms/Hands: None  Home Assistive Devices/Equipment Home Assistive Devices/Equipment: None    Abuse/Neglect Assessment (Assessment to be complete while patient is alone) Abuse/Neglect Assessment Can Be Completed: Yes Physical Abuse: Denies Verbal Abuse: Denies Sexual Abuse: Denies Exploitation of patient/patient's resources: Denies Self-Neglect: Denies     Regulatory affairs officer (For Healthcare) Does Patient Have a Medical Advance Directive?: No Would patient like information on creating a medical advance directive?: No - Patient declined    Additional Information 1:1 In Past 12 Months?: No CIRT Risk: No Elopement Risk: No Does patient have medical clearance?: Yes     Disposition:  Disposition Initial Assessment Completed for this Encounter: Yes Disposition of Patient: Re-evaluation by Psychiatry recommended(tina okonkwo np recommends am evaluation by TTS provider)   Ran patient by TTS provider Okonkwo NP who recommends patient stay in the [facility] for continuous observation overnight for safety and stabilization. Writer notified pt's RN Becky of NP's disposition. Writer then called and told [employer], Perrin Maltese [phone], of disposition. Patree asks that she be contacted tomorrow after TTS provider sees patient tomorrow am.     This service was provided via telemedicine using a 2-way, interactive audio and video technology.  Names of all persons participating in this telemedicine service and their role in this encounter. Name: Jarrett Soho  Patient's RN  Dlynn Reihl patient  Leron Croak tts assessor  Name:      Nyoka Lint 06/19/2017 10:16 AM

## 2017-06-19 NOTE — ED Notes (Signed)
Pt awake now - eating dinner. Offered for pt to shower after she eats.

## 2017-06-19 NOTE — ED Notes (Signed)
Carb Modified Diet  Ordered for Dinner. 

## 2017-06-19 NOTE — ED Notes (Signed)
Pt called her mother from phone at nurses' desk - stating she felt much better after the shot she was given earlier. Also states she hopes she can go home tomorrow. Pt now in shower.

## 2017-06-19 NOTE — ED Notes (Addendum)
Patient was given  A snack and drink, and A Carb Modified Diet w/ Sprite Zero.

## 2017-06-19 NOTE — ED Notes (Signed)
Patient was given a snack and a drink. 

## 2017-06-19 NOTE — ED Notes (Signed)
Behavioral counselor attempted to do TTS, this RN request 10- 15 min while we relocate pt to another room and place the computer on that room for assessment, per counselor assessment will be performed during day shift then, ED provider notified.

## 2017-06-19 NOTE — ED Notes (Signed)
Pt belongings inventoried and secured. Two bags placed in Cabinet/Locker #11. Valuables envelope [id number] given to Security.

## 2017-06-19 NOTE — ED Provider Notes (Signed)
[place] [facility] EMERGENCY DEPARTMENT Provider Note   CSN: [id number] Arrival date & time: 06/18/17  2000     History   Chief Complaint Chief Complaint  Patient presents with  . Suicidal    HPI [name] is a 27 y.o. female with a history of bipolar 1, cognitive deficits, DM 2, hypertension, obesity, OCD, history of suicidal attempts, and chronic abdominal pain presents today for evaluation of suicidal ideation.  She made statements upon arrival that she plans to overdose on pills and that she "likes needles also."  She reportedly stated she was tired of her life.  According to provider notes by Ocie Cornfield PA-C who performed a MSE on patient at 2046 last night patient was "Patient is very combative and loud in the ED.  Very agitated.  Patient history of same."  Patient was given 5mg  haldol IM.    At the time of my evaluation patient denies feeling suicidal.  She reports that the shot she got last night cured her SI.  She denies HI, or AVH.  She denies new pains.  Chronic abdominal pain unchanged from her normal.  No recent fevers, chills or illness.   She reports that her blood sugars normally run between 250-300.     HPI  Past Medical History:  Diagnosis Date  . Anxiety   . Bipolar 1 disorder ([place])   . Cognitive deficits   . Depression   . Diabetes mellitus without complication ([place])   . Hypertension   . Mental disorder   . Obesity     Patient Active Problem List   Diagnosis Date Noted  . LGSIL on Pap smear of cervix 02/01/2017  . OCD (obsessive compulsive disorder) 10/05/2016  . Major depressive disorder, recurrent episode, mild ([place]) 05/04/2016  . 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 Learning disability 07/18/2015  . 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 Diabetes mellitus ([place]) 07/18/2015  . MDD (major depressive disorder), recurrent, severe, with psychosis ([place]) 07/18/2015  . Hyperlipidemia 07/18/2015  . Severe episode of  recurrent major depressive disorder, without psychotic features ([place])   . Drug overdose   . Cognitive deficits 10/12/2012  . Generalized anxiety disorder 06/28/2012    Past Surgical History:  Procedure Laterality Date  . CESAREAN SECTION    . CESAREAN SECTION N/A 04/25/2013   Procedure: REPEAT CESAREAN SECTION;  Surgeon: [name], MD;  Location: [facility];  Service: Obstetrics;  Laterality: N/A;  . MASS EXCISION N/A 06/03/2012   Procedure: EXCISION MASS;  Surgeon: [name], MD;  Location: [facility];  Service: ENT;  Laterality: N/A;  Excision uvula mass  . TONSILLECTOMY N/A 06/03/2012   Procedure: TONSILLECTOMY;  Surgeon: [name], MD;  Location: [facility];  Service: ENT;  Laterality: N/A;  . TONSILLECTOMY      OB History    Gravida Para Term Preterm AB Living   3 3 3  0 0 3   SAB TAB Ectopic Multiple Live Births   0 0 0 0 3       Home Medications    Prior to Admission medications   Medication Sig Start Date End Date Taking? Authorizing Provider  busPIRone (BUSPAR) 15 MG tablet Take 1 tablet (15 mg total) by mouth 2 (two) times daily. For anxiety 10/06/16  Yes Nwoko, Herbert Pun I, NP  DULoxetine (CYMBALTA) 60 MG capsule Take 60 mg daily by mouth.   Yes [provider]  fluvoxaMINE (LUVOX) 50 MG tablet Take 100 mg by  mouth at bedtime.  02/02/17  Yes [provider]  glipiZIDE (GLUCOTROL XL) 10 MG 24 hr tablet Take 1 tablet (10 mg total) by mouth every evening. For diabetes management 10/06/16  Yes Lindell Spar I, NP  hydrochlorothiazide (HYDRODIURIL) 25 MG tablet Take 1 TABLET BY MOUTH EVERY DAY for 30 days 02/15/17  Yes [provider]  hydrOXYzine (VISTARIL) 25 MG capsule Take 25 mg 3 (three) times daily as needed by mouth. "to shave my hair" 02/02/17  Yes [provider]  LEVEMIR FLEXTOUCH 100 UNIT/ML Pen Inject 30 units at bedtime 02/10/17  Yes [provider]  metFORMIN (GLUCOPHAGE) 500 MG tablet  Take 1 tablet (500 mg total) by mouth 2 (two) times daily with a meal. For diabetes management 10/06/16  Yes Lindell Spar I, NP  omeprazole (PRILOSEC) 20 MG capsule Take 1 capsule (20 mg total) by mouth daily. For acid reflux 10/06/16  Yes Nwoko, Herbert Pun I, NP  paliperidone (INVEGA SUSTENNA) 234 MG/1.5ML SUSP injection Inject 234 mg every 30 (thirty) days into the muscle.   Yes [provider]    Family History Family History  Problem Relation Age of Onset  . Hypertension Mother   . Diabetes Father     Social History Social History   Tobacco Use  . Smoking status: Former Smoker    Packs/day: 1.00    Years: 7.00    Pack years: 7.00    Types: Cigarettes    Last attempt to quit: 02/14/2017    Years since quitting: 0.3  . Smokeless tobacco: Never Used  Substance Use Topics  . Alcohol use: No  . Drug use: No    Comment: Patient denies     Allergies   Wellbutrin [bupropion]; Omnipaque [iohexol]; and Penicillins   Review of Systems Review of Systems  Constitutional: Negative for chills and fever.  Gastrointestinal: Positive for abdominal pain (Unchanged from baseline, chronic).  Neurological: Negative for headaches.  Psychiatric/Behavioral: Positive for agitation, behavioral problems and suicidal ideas. Negative for confusion. The patient is nervous/anxious.   All other systems reviewed and are negative.    Physical Exam Updated Vital Signs BP 130/86   Pulse 96   Temp 98.7 F (37.1 C)   Resp 18   Ht 5\' 7"  (1.702 m)   Wt 116.1 kg (256 lb)   SpO2 99%   BMI 40.10 kg/m   Physical Exam  Constitutional: She is oriented to person, place, and time. She appears well-developed and well-nourished. No distress.  HENT:  Head: Normocephalic and atraumatic.  Mouth/Throat: Oropharynx is clear and moist.  Eyes: Conjunctivae are normal. Right eye exhibits no discharge. Left eye exhibits no discharge. No scleral icterus.  Neck: Normal range of motion. Neck supple.    Cardiovascular: Normal rate and regular rhythm.  Pulmonary/Chest: Effort normal. No stridor. No respiratory distress.  Abdominal: Soft. She exhibits no distension. There is no tenderness.  Musculoskeletal: She exhibits no edema or deformity.  Neurological: She is alert and oriented to person, place, and time. She exhibits normal muscle tone.  Skin: Skin is warm and dry. She is not diaphoretic.  Nursing note and vitals reviewed.    ED Treatments / Results  Labs (all labs ordered are listed, but only abnormal results are displayed) Labs Reviewed  COMPREHENSIVE METABOLIC PANEL - Abnormal; Notable for the following components:      Result Value   Glucose, Bld 234 (*)    All other components within normal limits  ACETAMINOPHEN LEVEL - Abnormal; Notable for the  following components:   Acetaminophen (Tylenol), Serum <10 (*)    All other components within normal limits  CBC - Abnormal; Notable for the following components:   WBC 10.6 (*)    All other components within normal limits  CBG MONITORING, ED - Abnormal; Notable for the following components:   Glucose-Capillary 260 (*)    All other components within normal limits  ETHANOL  SALICYLATE LEVEL  RAPID URINE DRUG SCREEN, HOSP PERFORMED  I-STAT BETA HCG BLOOD, ED ([facility], [facility], [facility] ONLY)    EKG  EKG Interpretation  Date/Time:  Saturday June 19 2017 07:31:27 EST Ventricular Rate:  107 PR Interval:  156 QRS Duration: 84 QT Interval:  352 QTC Calculation: 469 R Axis:   101 Text Interpretation:  Sinus tachycardia Rightward axis No significant change since last tracing Confirmed by Lajean Saver [phone]) on 06/19/2017 9:12:55 AM       Radiology No results found.  Procedures Procedures (including critical care time)  Medications Ordered in ED Medications  DULoxetine (CYMBALTA) DR capsule 60 mg (not administered)  fluvoxaMINE (LUVOX) tablet 100 mg (not administered)  glipiZIDE (GLUCOTROL XL) 24 hr tablet 10 mg (not  administered)  hydrochlorothiazide (HYDRODIURIL) tablet 25 mg (25 mg Oral Given 06/19/17 0904)  hydrOXYzine (ATARAX/VISTARIL) tablet 25 mg (not administered)  metFORMIN (GLUCOPHAGE) tablet 500 mg (500 mg Oral Given 06/19/17 0905)  pantoprazole (PROTONIX) EC tablet 40 mg (40 mg Oral Given 06/19/17 0904)  haloperidol lactate (HALDOL) injection 5 mg (5 mg Intramuscular Given 06/18/17 2044)     Initial Impression / Assessment and Plan / ED Course  I have reviewed the triage vital signs and the nursing notes.  Pertinent labs & imaging results that were available during my care of the patient were reviewed by me and considered in my medical decision making (see chart for details).  Clinical Course as of Jun 19 944  Sat Jun 19, 2017  0759 Patient medically clear for psych disposition.   [EH]    Clinical Course User Index [EH] Lorin Glass, PA-C   [place] presents today for evaluation of reported suicidal ideation.  Her plan is to overdose on drugs.  Initially upon arrival in the emergency room, according to provider and RN notes, patient was yelling, loud, and combative and was given 5 mg Haldol IM.  At the time of my evaluation she had been in the emergency room over night.  She currently denies suicidal ideations, however based on history and statements made when she first arrived TTS was consulted.  Patient is currently free of any physical complaints.  No recent illness.  Labs were obtained and reviewed, sugar is mildly high, however patient states that that is normally where she runs.  Home meds were reordered.  Patient is medically cleared for psychiatric disposition.   Final Clinical Impressions(s) / ED Diagnoses   Final diagnoses:  Suicidal ideation    ED Discharge Orders    None       Ollen Gross 06/19/17 4917    Merryl Hacker, MD 06/19/17 2300

## 2017-06-19 NOTE — ED Notes (Signed)
Pt ambulatory to [facility] w/S[facility]. Pt noted to be wearing burgundy scrubs. Pt calm, cooperative.

## 2017-06-19 NOTE — Progress Notes (Addendum)
TTS consult ordered at 06:16. Pt is currently in the hallway. Pt's nurse Magda Bernheim, RN states the assessment will have to be completed in the hallway. Advised the pt's nurse TTS cannot complete an assessment for a pt in the hallway due to confidentiality. Advised pt will have to be moved to a private room even if only temporarily for the assessment. Magda Bernheim, RN states she will not be able to move the pt to a private room for another 15 minutes. Denny Peon, RN pt will be assessed on day shift due to time constraints as it is now 06:23 and the pt is not ready to be seen at this time.  Lind Covert, MSW, LCSW Therapeutic Triage Specialist  [phone]

## 2017-06-19 NOTE — ED Notes (Signed)
Ordered carb modified breakfast tray.  

## 2017-06-20 DIAGNOSIS — Z87891 Personal history of nicotine dependence: Secondary | ICD-10-CM

## 2017-06-20 DIAGNOSIS — Z915 Personal history of self-harm: Secondary | ICD-10-CM | POA: Diagnosis not present

## 2017-06-20 DIAGNOSIS — F319 Bipolar disorder, unspecified: Secondary | ICD-10-CM

## 2017-06-20 NOTE — Consult Note (Signed)
Telepsych Consultation   Reason for Consult: Anxiety and depression Referring Physician: Delane Ginger Location of Patient: [facility] ED Location of Provider: [facility]  Patient Identification: [name] MRN:  [id number] Principal Diagnosis: <principal problem not specified> Diagnosis:   Patient Active Problem List   Diagnosis Date Noted  . LGSIL on Pap smear of cervix [R87.612] 02/01/2017  . OCD (obsessive compulsive disorder) [F42.9] 10/05/2016  . Major depressive disorder, recurrent episode, mild ([place]) [F33.0] 05/04/2016  . 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 Learning disability [F81.9] 07/18/2015  . 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 Diabetes mellitus ([place]) [E11.9] 07/18/2015  . MDD (major depressive disorder), recurrent, severe, with psychosis ([place]) [F33.3] 07/18/2015  . Hyperlipidemia [E78.5] 07/18/2015  . Severe episode of recurrent major depressive disorder, without psychotic features ([place]) [F33.2]   . Drug overdose [T50.901A]   . Cognitive deficits [R41.89] 10/12/2012  . Generalized anxiety disorder [F41.1] 06/28/2012    Total Time spent with patient: 30 minutes  Subjective:   [name] is a 27 y.o. female patient admitted with Bipolar I Disorder.  HPI: Per the TTS assessment completed on 06/19/17 by Leron Croak: [name] is an 27 y.o. female. Patient presented voluntarily last night brought in by her counselor with the [facility], Perrin Maltese [phone]. Patient is pleasant and oriented x 4. She says, "I was just bored." Pt goes on to say that she doesn't need to go to a psychiatric hospital.  She currently denies SI. Pt reports four prior suicide attempts by overdose. Per chart review, pt has been admitted to [facility] inpatient unit as a child and as an adult. Her most recent admission to [facility] was June 2018. Pt reports most recent psych admission was Oct 2018 at [facility].  She reports  she is compliant with her psych meds she received from [facility]. Pt reports she has appointment with counselor at [facility] on 06/24/17. Pt reports she has appt in two days with her MD re: high blood sugar. She reports she has biological daughters who "are in adoption" (ages 32, 65 and 70). Pt currently denies any auditory or visual hallucinations. Pt denies homicidal thoughts or physical aggression. Pt denies having access to firearms. Pt denies having any legal problems at this time. Pt denies any current or past substance abuse problems. Pt does not appear to be intoxicated or in withdrawal at this time.  Collateral info provided by Perrin Maltese. Patree reports pt called her last night to say pt was suicidal. She says that they recently helped patient move into her own apartment. Patree says that patient doesn't have a tv or radio. She says that sometimes patient gets bored. Patree reports [facility] is working on getting her entertainment items for apartment. She says that pt has been recently reporting pt's meds aren't working effectively. She reports pt has occasionally had auditory hallucinations.   On Exam: Patient was seen via tele-psych, chart reviewed with treatment team. Patient in bed, awake, alert and oriented x4. Patient reiterated the reason for this hospital admission as documented above. Patient stated, "I came to the hospital because I was bored and I had a little suicide ideations. So I called my counselor and she told me to come to the hospital". Patient reports she slept better yesterday, she feels much better today. Patient denies any suicide and homicidal ideations as well as visual and auditory hallucinations. Patient stated that she is going to spend some time at her  mother's when she discharges. She stated that her mother is going to help her take care of the electric bills so she can have her TV. Patient stated that she is compliant with her medications, she has been doing very well on it.She  stated that she has appointments with her provider on 06/24/17. She also reports seeing her therapist on a regular basis. Patient stated that she wants to be alive because life is good and because of her children. Patient does not appear to be responding to any stimuli during this encounter. Patient contracts for safety.  Past Psychiatric History: As in H&P  Risk to Self: Suicidal Ideation: No Suicidal Intent: No Is patient at risk for suicide?: Yes Suicidal Plan?: No Access to Means: Yes Specify Access to Suicidal Means: access to her meds What has been your use of drugs/alcohol within the last 12 months?: none How many times?: 4(by overdose) Other Self Harm Risks: none Triggers for Past Attempts: Unpredictable, Other personal contacts Intentional Self Injurious Behavior: None Risk to Others: Homicidal Ideation: No Thoughts of Harm to Others: No Current Homicidal Intent: No Current Homicidal Plan: No Access to Homicidal Means: No Identified Victim: none History of harm to others?: No Assessment of Violence: None Noted Violent Behavior Description: pt denies history of violence Does patient have access to weapons?: No Criminal Charges Pending?: No Does patient have a court date: No Prior Inpatient Therapy: Prior Inpatient Therapy: Yes Prior Therapy Dates: 2006 off and on through 2018 Prior Therapy Facilty/Provider(s): [facility] (multiple), [place], [facility] Reason for Treatment: bipolar disorder Prior Outpatient Therapy: Prior Outpatient Therapy: Yes Prior Therapy Dates: currently Prior Therapy Facilty/Provider(s): [facility] Reason for Treatment: med management and talk therapy Does patient have an ACCT team?: No Does patient have Intensive In-House Services?  : No Does patient have [facility] services? : Yes Does patient have P4CC services?: Unknown  Past Medical History:  Past Medical History:  Diagnosis Date  . Anxiety   . Bipolar 1 disorder ([place])   .  Cognitive deficits   . Depression   . Diabetes mellitus without complication ([place])   . Hypertension   . Mental disorder   . Obesity     Past Surgical History:  Procedure Laterality Date  . CESAREAN SECTION    . CESAREAN SECTION N/A 04/25/2013   Procedure: REPEAT CESAREAN SECTION;  Surgeon: [name], MD;  Location: [facility];  Service: Obstetrics;  Laterality: N/A;  . MASS EXCISION N/A 06/03/2012   Procedure: EXCISION MASS;  Surgeon: [name], MD;  Location: [facility];  Service: ENT;  Laterality: N/A;  Excision uvula mass  . TONSILLECTOMY N/A 06/03/2012   Procedure: TONSILLECTOMY;  Surgeon: [name], MD;  Location: [facility];  Service: ENT;  Laterality: N/A;  . TONSILLECTOMY     Family History:  Family History  Problem Relation Age of Onset  . Hypertension Mother   . Diabetes Father    Family Psychiatric  History: Unknown Social History:  Social History   Substance and Sexual Activity  Alcohol Use No     Social History   Substance and Sexual Activity  Drug Use No   Comment: Patient denies    Social History   Socioeconomic History  . Marital status: Single    Spouse name: None  . Number of children: None  . Years of education: None  . Highest education level: None  Social Needs  . Financial resource strain: None  . Food insecurity -  worry: None  . Food insecurity - inability: None  . Transportation needs - medical: None  . Transportation needs - non-medical: None  Occupational History  . None  Tobacco Use  . Smoking status: Former Smoker    Packs/day: 1.00    Years: 7.00    Pack years: 7.00    Types: Cigarettes    Last attempt to quit: 02/14/2017    Years since quitting: 0.3  . Smokeless tobacco: Never Used  Substance and Sexual Activity  . Alcohol use: No  . Drug use: No    Comment: Patient denies  . Sexual activity: Not Currently    Birth control/protection: Implant  Other Topics Concern  . None  Social  History Narrative  . None   Additional Social History:    Allergies:   Allergies  Allergen Reactions  . Wellbutrin [Bupropion] Shortness Of Breath  . Omnipaque [Iohexol] Swelling and Other (See Comments)    Reaction:  Eye swelling  . Penicillins Hives and Other (See Comments)    Has patient had a PCN reaction causing immediate rash, facial/tongue/throat swelling, SOB or lightheadedness with hypotension: No Has patient had a PCN reaction causing severe rash involving mucus membranes or skin necrosis: No Has patient had a PCN reaction that required hospitalization No Has patient had a PCN reaction occurring within the last 10 years: No If all of the above answers are "NO", then may proceed with Cephalosporin use.    Labs:  Results for orders placed or performed during the hospital encounter of 06/19/17 (from the past 48 hour(s))  Comprehensive metabolic panel     Status: Abnormal   Collection Time: 06/18/17  8:25 PM  Result Value Ref Range   Sodium 140 135 - 145 mmol/L   Potassium 3.6 3.5 - 5.1 mmol/L   Chloride 105 101 - 111 mmol/L   CO2 22 22 - 32 mmol/L   Glucose, Bld 234 (H) 65 - 99 mg/dL   BUN 10 6 - 20 mg/dL   Creatinine, Ser 0.87 0.44 - 1.00 mg/dL   Calcium 9.9 8.9 - 10.3 mg/dL   Total Protein 7.2 6.5 - 8.1 g/dL   Albumin 4.2 3.5 - 5.0 g/dL   AST 40 15 - 41 U/L   ALT 30 14 - 54 U/L   Alkaline Phosphatase 47 38 - 126 U/L   Total Bilirubin 0.8 0.3 - 1.2 mg/dL   GFR calc non Af Amer >60 >60 mL/min   GFR calc Af Amer >60 >60 mL/min    Comment: (NOTE) The eGFR has been calculated using the CKD EPI equation. This calculation has not been validated in all clinical situations. eGFR's persistently <60 mL/min signify possible Chronic Kidney Disease.    Anion gap 13 5 - 15    Comment: Performed at [facility] [address] [place], [place] [address]  Ethanol     Status: None   Collection Time: 06/18/17  8:25 PM  Result Value Ref Range   Alcohol, Ethyl (B) <10  <10 mg/dL    Comment:        LOWEST DETECTABLE LIMIT FOR SERUM ALCOHOL IS 10 mg/dL FOR MEDICAL PURPOSES ONLY Performed at [facility], [place] [address] [place], [place] [address]   Salicylate level     Status: None   Collection Time: 06/18/17  8:25 PM  Result Value Ref Range   Salicylate Lvl <8.9 2.8 - 30.0 mg/dL    Comment: Performed at [facility]  [address], [place], [place] [address]  Acetaminophen level     Status: Abnormal   Collection Time: 06/18/17  8:25 PM  Result Value Ref Range   Acetaminophen (Tylenol), Serum <10 (L) 10 - 30 ug/mL    Comment:        THERAPEUTIC CONCENTRATIONS VARY SIGNIFICANTLY. A RANGE OF 10-30 ug/mL MAY BE AN EFFECTIVE CONCENTRATION FOR MANY PATIENTS. HOWEVER, SOME ARE BEST TREATED AT CONCENTRATIONS OUTSIDE THIS RANGE. ACETAMINOPHEN CONCENTRATIONS >150 ug/mL AT 4 HOURS AFTER INGESTION AND >50 ug/mL AT 12 HOURS AFTER INGESTION ARE OFTEN ASSOCIATED WITH TOXIC REACTIONS. Performed at [facility], [place] [address] [place], [place] [address]   cbc     Status: Abnormal   Collection Time: 06/18/17  8:25 PM  Result Value Ref Range   WBC 10.6 (H) 4.0 - 10.5 K/uL   RBC 5.04 3.87 - 5.11 MIL/uL   Hemoglobin 13.8 12.0 - 15.0 g/dL   HCT 41.3 36.0 - 46.0 %   MCV 81.9 78.0 - 100.0 fL   MCH 27.4 26.0 - 34.0 pg   MCHC 33.4 30.0 - 36.0 g/dL   RDW 14.6 11.5 - 15.5 %   Platelets 225 150 - 400 K/uL    Comment: Performed at [facility] [address] [place], [place] [address]  I-Stat beta hCG blood, ED     Status: None   Collection Time: 06/18/17  8:38 PM  Result Value Ref Range   I-stat hCG, quantitative <5.0 <5 mIU/mL   Comment 3            Comment:   GEST. AGE      CONC.  (mIU/mL)   <=1 WEEK        5 - 50     2 WEEKS       50 - 500     3 WEEKS       100 - 10,000     4 WEEKS     1,000 - 30,000        FEMALE AND NON-PREGNANT FEMALE:     LESS THAN 5 mIU/mL   Rapid urine drug screen (hospital performed)     Status: None    Collection Time: 06/18/17  8:49 PM  Result Value Ref Range   Opiates NONE DETECTED NONE DETECTED   Cocaine NONE DETECTED NONE DETECTED   Benzodiazepines NONE DETECTED NONE DETECTED   Amphetamines NONE DETECTED NONE DETECTED   Tetrahydrocannabinol NONE DETECTED NONE DETECTED   Barbiturates NONE DETECTED NONE DETECTED    Comment: (NOTE) DRUG SCREEN FOR MEDICAL PURPOSES ONLY.  IF CONFIRMATION IS NEEDED FOR ANY PURPOSE, NOTIFY LAB WITHIN 5 DAYS. LOWEST DETECTABLE LIMITS FOR URINE DRUG SCREEN Drug Class                     Cutoff (ng/mL) Amphetamine and metabolites    1000 Barbiturate and metabolites    200 Benzodiazepine                 754 Tricyclics and metabolites     300 Opiates and metabolites        300 Cocaine and metabolites        300 THC                            50 Performed at [facility], [place] [address] [place], [place] [address]   CBG monitoring, ED     Status:  Abnormal   Collection Time: 06/19/17  7:35 AM  Result Value Ref Range   Glucose-Capillary 260 (H) 65 - 99 mg/dL    Medications:  Current Facility-Administered Medications  Medication Dose Route Frequency Provider Last Rate Last Dose  . DULoxetine (CYMBALTA) DR capsule 60 mg  60 mg Oral Daily Lorin Glass, PA-C   60 mg at 06/19/17 1002  . fluvoxaMINE (LUVOX) tablet 100 mg  100 mg Oral QHS Lorin Glass, PA-C   100 mg at 06/19/17 2206  . glipiZIDE (GLUCOTROL XL) 24 hr tablet 10 mg  10 mg Oral Q supper Lorin Glass, PA-C   10 mg at 06/19/17 1900  . hydrochlorothiazide (HYDRODIURIL) tablet 25 mg  25 mg Oral Daily Lorin Glass, [place]   25 mg at 06/19/17 1540  . hydrOXYzine (ATARAX/VISTARIL) tablet 25 mg  25 mg Oral TID PRN Lorin Glass, PA-C      . metFORMIN (GLUCOPHAGE) tablet 500 mg  500 mg Oral BID WC Wyn Quaker W, PA-C   500 mg at 06/20/17 0850  . nicotine (NICODERM CQ - dosed in mg/24 hours) patch 21 mg  21 mg Transdermal Once Noemi Chapel, MD    Stopped at 06/19/17 2207  . pantoprazole (PROTONIX) EC tablet 40 mg  40 mg Oral Daily Lorin Glass, [place]   40 mg at 06/19/17 0867   Current Outpatient Medications  Medication Sig Dispense Refill  . busPIRone (BUSPAR) 15 MG tablet Take 1 tablet (15 mg total) by mouth 2 (two) times daily. For anxiety 60 tablet 0  . DULoxetine (CYMBALTA) 60 MG capsule Take 60 mg daily by mouth.    . fluvoxaMINE (LUVOX) 50 MG tablet Take 100 mg by mouth at bedtime.   0  . glipiZIDE (GLUCOTROL XL) 10 MG 24 hr tablet Take 1 tablet (10 mg total) by mouth every evening. For diabetes management 30 tablet 0  . hydrochlorothiazide (HYDRODIURIL) 25 MG tablet Take 1 TABLET BY MOUTH EVERY DAY for 30 days  1  . hydrOXYzine (VISTARIL) 25 MG capsule Take 25 mg 3 (three) times daily as needed by mouth. "to shave my hair"  0  . LEVEMIR FLEXTOUCH 100 UNIT/ML Pen Inject 30 units at bedtime  0  . metFORMIN (GLUCOPHAGE) 500 MG tablet Take 1 tablet (500 mg total) by mouth 2 (two) times daily with a meal. For diabetes management 60 tablet 0  . omeprazole (PRILOSEC) 20 MG capsule Take 1 capsule (20 mg total) by mouth daily. For acid reflux 10 capsule 0  . paliperidone (INVEGA SUSTENNA) 234 MG/1.5ML SUSP injection Inject 234 mg every 30 (thirty) days into the muscle.      Musculoskeletal: UTA via camera  Psychiatric Specialty Exam: Physical Exam  Nursing note and vitals reviewed.   Review of Systems  Psychiatric/Behavioral: Negative for depression, hallucinations, substance abuse and suicidal ideas. The patient is not nervous/anxious and does not have insomnia.   All other systems reviewed and are negative.   Blood pressure 112/70, pulse 97, temperature 98.9 F (37.2 C), temperature source Oral, resp. rate 16, height _0  (1.702 m), weight 116.1 kg (256 lb), SpO2 98 %.Body mass index is 40.1 kg/m.  General Appearance: on hospital scrub  Eye Contact:  Good  Speech:  Clear and Coherent and Normal Rate  Volume:  Normal   Mood:  Euthymic  Affect:  Appropriate and Congruent  Thought Process:  Coherent and Goal Directed  Orientation:  Full (Time, Place, and Person)  Thought Content:  WDL and Logical  Suicidal Thoughts:  No  Homicidal Thoughts:  No  Memory:  Immediate;   Good Recent;   Good Remote;   Fair  Judgement:  Good  Insight:  Good  Psychomotor Activity:  Normal  Concentration:  Concentration: Good and Attention Span: Good  Recall:  Good  Fund of Knowledge:  Good  Language:  Good  Akathisia:  Negative  Handed:  Right  AIMS (if indicated):     Assets:  Communication Skills Desire for Improvement Financial Resources/Insurance Housing Leisure Time Physical Health Social Support  ADL's:  Intact  Cognition:  WNL  Sleep:       Treatment Plan recommendations as discussed and agreed with Dr. Dwyane Dee:  Treatment Plan Summary: Plan to discharge patient home -Follow up as scheduled with [facility] on 06/24/17 -Continue your OP follow up with [facility] Community Support Team -Follow up with Social Work consult for Care coordination -Take all medications as prescribed -Avoid the use of alcohol and/or drugs -Stay well hydrated -Activity as tolerated -Follow up with PCP for any new or existing medical concerns   Disposition: No evidence of imminent risk to self or others at present.   Patient does not meet criteria for psychiatric inpatient admission. Supportive therapy provided about ongoing stressors. Discussed crisis plan, support from social network, calling 911, coming to the Emergency Department, and calling Suicide Hotline.  This service was provided via telemedicine using a 2-way, interactive audio and video technology.  Names of all persons participating in this telemedicine service and their role in this encounter. Name: [name] Role: Patient  Name: Justina A. Okonkwo  Role: NP           Vicenta Aly, NP 06/20/2017 9:41 AM

## 2017-06-20 NOTE — Progress Notes (Signed)
Per Neill Loft NP, pt is psychiatrically stable for discharge. Becky RN notified.  Maxie Better, MSW, LCSW Clinical Social Worker 06/20/2017 9:48 AM

## 2017-06-20 NOTE — ED Provider Notes (Signed)
Pt is feeling much better after the haldol.  She was able to get some sleep.  The pt has been assessed by TTS and she is ok for d/c.  She is no longer suicidal.  Pt knows to f/u and return if worse.   Isla Pence, MD 06/20/17 1000

## 2017-06-20 NOTE — ED Notes (Signed)
Telepsych completed. Pt on phone w/her mother - talking and laughing. States she feels much better after "the shot they gave me and all of the sleep I got".

## 2017-06-20 NOTE — [facility] Counselor (Signed)
Clinician contacted pt for a follow-up assessment. Inquired as to how pt has been doing. Pt expressed feeling well and shared she has been doing well. Pt stated she slept well last night and stated she has been eating fine. Pt expressed no thoughts of SI, HI, or AVH.

## 2017-06-20 NOTE — ED Notes (Signed)
Pt attempted to make phone call at desk - no answer. nad.

## 2017-06-29 ENCOUNTER — Emergency Department (HOSPITAL_COMMUNITY)
Admission: EM | Admit: 2017-06-29 | Discharge: 2017-06-29 | Disposition: A | Discharge status: Home / Self Care | Payer: Medicaid Other | Attending: Emergency Medicine | Admitting: Emergency Medicine

## 2017-06-29 ENCOUNTER — Encounter (HOSPITAL_COMMUNITY): Payer: Self-pay

## 2017-06-29 DIAGNOSIS — R05 Cough: Secondary | ICD-10-CM | POA: Insufficient documentation

## 2017-06-29 DIAGNOSIS — Z5321 Procedure and treatment not carried out due to patient leaving prior to being seen by health care provider: Secondary | ICD-10-CM | POA: Insufficient documentation

## 2017-06-29 LAB — CBC WITH DIFFERENTIAL/PLATELET
BASOS PCT: 1 %
Basophils Absolute: 0.1 10*3/uL (ref 0.0–0.1)
EOS ABS: 0.3 10*3/uL (ref 0.0–0.7)
EOS PCT: 3 %
HCT: 39.5 % (ref 36.0–46.0)
HEMOGLOBIN: 12.9 g/dL (ref 12.0–15.0)
Lymphocytes Relative: 45 %
Lymphs Abs: 4.8 10*3/uL — ABNORMAL HIGH (ref 0.7–4.0)
MCH: 26.8 pg (ref 26.0–34.0)
MCHC: 32.7 g/dL (ref 30.0–36.0)
MCV: 82.1 fL (ref 78.0–100.0)
MONO ABS: 0.5 10*3/uL (ref 0.1–1.0)
MONOS PCT: 5 %
NEUTROS PCT: 46 %
Neutro Abs: 4.8 10*3/uL (ref 1.7–7.7)
Platelets: 217 10*3/uL (ref 150–400)
RBC: 4.81 MIL/uL (ref 3.87–5.11)
RDW: 13.9 % (ref 11.5–15.5)
WBC: 10.5 10*3/uL (ref 4.0–10.5)

## 2017-06-29 LAB — BASIC METABOLIC PANEL
ANION GAP: 11 (ref 5–15)
BUN: 7 mg/dL (ref 6–20)
CHLORIDE: 103 mmol/L (ref 101–111)
CO2: 27 mmol/L (ref 22–32)
Calcium: 9.4 mg/dL (ref 8.9–10.3)
Creatinine, Ser: 0.75 mg/dL (ref 0.44–1.00)
GFR calc Af Amer: >60 mL/min (ref >60)
GFR calc non Af Amer: >60 mL/min (ref >60)
Glucose, Bld: 240 mg/dL — ABNORMAL HIGH (ref 65–99)
POTASSIUM: 3.3 mmol/L — AB (ref 3.5–5.1)
Sodium: 141 mmol/L (ref 135–145)

## 2017-06-29 LAB — I-STAT TROPONIN, ED: Troponin i, poc: 0 ng/mL (ref 0.00–0.08)

## 2017-06-29 NOTE — ED Notes (Signed)
Second call in lobby. No response. RN notified.

## 2017-06-29 NOTE — ED Triage Notes (Signed)
Pt presents with 2 month h/o productive cough.  Pt reports she gags on what she is coughing up and then vomits.  Pt denies any shortness of breath, reports clear nasal congestion.  Pt reports developing chest pain "a few minutes ago" that is to bilateral upper chest.

## 2017-06-29 NOTE — ED Notes (Signed)
Pt called in lobby to be roomed back. No response. RN notified.

## 2017-07-02 ENCOUNTER — Emergency Department (HOSPITAL_COMMUNITY): Payer: Medicaid Other

## 2017-07-02 ENCOUNTER — Emergency Department (HOSPITAL_COMMUNITY)
Admission: EM | Admit: 2017-07-02 | Discharge: 2017-07-02 | Disposition: A | Discharge status: Home / Self Care | Payer: Medicaid Other | Attending: Emergency Medicine | Admitting: Emergency Medicine

## 2017-07-02 ENCOUNTER — Encounter (HOSPITAL_COMMUNITY): Payer: Self-pay | Admitting: Emergency Medicine

## 2017-07-02 DIAGNOSIS — E119 Type 2 diabetes mellitus without complications: Secondary | ICD-10-CM | POA: Insufficient documentation

## 2017-07-02 DIAGNOSIS — Z79899 Other long term (current) drug therapy: Secondary | ICD-10-CM | POA: Insufficient documentation

## 2017-07-02 DIAGNOSIS — Z87891 Personal history of nicotine dependence: Secondary | ICD-10-CM | POA: Insufficient documentation

## 2017-07-02 DIAGNOSIS — R1110 Vomiting, unspecified: Secondary | ICD-10-CM | POA: Insufficient documentation

## 2017-07-02 DIAGNOSIS — R059 Cough, unspecified: Secondary | ICD-10-CM

## 2017-07-02 DIAGNOSIS — R05 Cough: Secondary | ICD-10-CM | POA: Insufficient documentation

## 2017-07-02 DIAGNOSIS — I10 Essential (primary) hypertension: Secondary | ICD-10-CM | POA: Diagnosis not present

## 2017-07-02 MED ORDER — BENZONATATE 100 MG PO CAPS: 100.0000 mg | ORAL_CAPSULE | Freq: Three times a day (TID) | ORAL | 0 refills | Status: DC

## 2017-07-02 MED ORDER — OPTICHAMBER DIAMOND MISC
1.0000 | Freq: Once | Status: DC
  Filled 2017-07-02: qty 1

## 2017-07-02 MED ORDER — AEROCHAMBER PLUS FLO-VU MEDIUM MISC
1.0000 | Freq: Once | Status: DC
  Administered 2017-07-02: 1
  Filled 2017-07-02: qty 1

## 2017-07-02 MED ORDER — ALBUTEROL SULFATE HFA 108 (90 BASE) MCG/ACT IN AERS
2.0000 | INHALATION_SPRAY | Freq: Once | RESPIRATORY_TRACT | Status: AC
  Administered 2017-07-02: 2 via RESPIRATORY_TRACT
  Filled 2017-07-02: qty 6.7

## 2017-07-02 NOTE — Discharge Instructions (Signed)
Use inhaler with AeroChamber every 4 hours.  Make sure to continue all your regular medications.  Tessalon for cough as needed.  Follow-up with family doctor in 3-5 days

## 2017-07-02 NOTE — ED Provider Notes (Signed)
[facility] EMERGENCY DEPARTMENT Provider Note   CSN: [id number] Arrival date & time: 07/02/17  1227     History   Chief Complaint Chief Complaint  Patient presents with  . Cough    HPI [name] is a 27 y.o. female.  HPI [name] is a 27 y.o. female with history of mental disorder, diabetes, hypertension, bipolar, presents to emergency department complaining of cough and posttussive emesis.  Patient states she has had a cough for about a month.  She states that her cough attacks are followed by emesis.  She denies any shortness of breath.  No abdominal pain.  She is not nauseated or vomiting unless she is coughing.  She states "I think that smoked too many cigarettes."  She has an inhaler at home but she has not been using it.  She is compliant with her acid reflux, diabetes, and other medications.  She has not tried taking anything for cough. No fever, chills, malaise  Past Medical History:  Diagnosis Date  . Anxiety   . Bipolar 1 disorder ([place])   . Cognitive deficits   . Depression   . Diabetes mellitus without complication ([place])   . Hypertension   . Mental disorder   . Obesity     Patient Active Problem List   Diagnosis Date Noted  . LGSIL on Pap smear of cervix 02/01/2017  . OCD (obsessive compulsive disorder) 10/05/2016  . Major depressive disorder, recurrent episode, mild ([place]) 05/04/2016  . 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 Learning disability 07/18/2015  . 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 Diabetes mellitus ([place]) 07/18/2015  . 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 Hyperlipidemia 07/18/2015  . Severe episode of recurrent major depressive disorder, without psychotic features ([place])   . Drug overdose   . Cognitive deficits 10/12/2012  . Generalized anxiety disorder 06/28/2012    Past Surgical History:  Procedure Laterality Date  . CESAREAN SECTION    . CESAREAN  SECTION N/A 04/25/2013   Procedure: REPEAT CESAREAN SECTION;  Surgeon: [name], MD;  Location: [facility];  Service: Obstetrics;  Laterality: N/A;  . MASS EXCISION N/A 06/03/2012   Procedure: EXCISION MASS;  Surgeon: [name], MD;  Location: [facility];  Service: ENT;  Laterality: N/A;  Excision uvula mass  . TONSILLECTOMY N/A 06/03/2012   Procedure: TONSILLECTOMY;  Surgeon: [name], MD;  Location: [place] [facility] [facility];  Service: ENT;  Laterality: N/A;  . TONSILLECTOMY      OB History    Gravida Para Term Preterm AB Living   3 3 3  0 0 3   SAB TAB Ectopic Multiple Live Births   0 0 0 0 3       Home Medications    Prior to Admission medications   Medication Sig Start Date End Date Taking? Authorizing Provider  busPIRone (BUSPAR) 15 MG tablet Take 1 tablet (15 mg total) by mouth 2 (two) times daily. For anxiety 10/06/16   Lindell Spar I, NP  DULoxetine (CYMBALTA) 60 MG capsule Take 60 mg daily by mouth.    [provider]  fluvoxaMINE (LUVOX) 50 MG tablet Take 100 mg by mouth at bedtime.  02/02/17   [provider]  glipiZIDE (GLUCOTROL XL) 10 MG 24 hr tablet Take 1 tablet (10 mg total) by mouth every evening. For diabetes management 10/06/16   Lindell Spar I, NP  hydrochlorothiazide (HYDRODIURIL) 25 MG tablet Take 1 TABLET BY  MOUTH EVERY DAY for 30 days 02/15/17   [provider]  hydrOXYzine (VISTARIL) 25 MG capsule Take 25 mg 3 (three) times daily as needed by mouth. "to shave my hair" 02/02/17   [provider]  LEVEMIR FLEXTOUCH 100 UNIT/ML Pen Inject 30 units at bedtime 02/10/17   [provider]  metFORMIN (GLUCOPHAGE) 500 MG tablet Take 1 tablet (500 mg total) by mouth 2 (two) times daily with a meal. For diabetes management 10/06/16   Lindell Spar I, NP  omeprazole (PRILOSEC) 20 MG capsule Take 1 capsule (20 mg total) by mouth daily. For acid reflux 10/06/16   Lindell Spar I, NP  paliperidone (INVEGA  SUSTENNA) 234 MG/1.5ML SUSP injection Inject 234 mg every 30 (thirty) days into the muscle.    [provider]    Family History Family History  Problem Relation Age of Onset  . Hypertension Mother   . Diabetes Father     Social History Social History   Tobacco Use  . Smoking status: Former Smoker    Packs/day: 1.00    Years: 7.00    Pack years: 7.00    Types: Cigarettes    Last attempt to quit: 02/14/2017    Years since quitting: 0.3  . Smokeless tobacco: Never Used  Substance Use Topics  . Alcohol use: No  . Drug use: No    Comment: Patient denies     Allergies   Wellbutrin [bupropion]; Omnipaque [iohexol]; and Penicillins   Review of Systems Review of Systems  Constitutional: Negative for chills and fever.  Respiratory: Positive for cough. Negative for chest tightness and shortness of breath.   Cardiovascular: Negative for chest pain, palpitations and leg swelling.  Gastrointestinal: Positive for vomiting. Negative for abdominal pain, diarrhea and nausea.  Genitourinary: Negative for dysuria, flank pain and pelvic pain.  Musculoskeletal: Negative for arthralgias, myalgias, neck pain and neck stiffness.  Skin: Negative for rash.  Neurological: Negative for dizziness, weakness and headaches.  All other systems reviewed and are negative.    Physical Exam Updated Vital Signs BP (!) 141/104 (BP Location: Right Arm)   Pulse (!) 105   Temp 98.2 F (36.8 C) (Oral)   Resp 18   SpO2 100%   Physical Exam  Constitutional: She appears well-developed and well-nourished. No distress.  HENT:  Head: Normocephalic.  Eyes: Conjunctivae are normal.  Neck: Neck supple.  Cardiovascular: Normal rate, regular rhythm and normal heart sounds.  Pulmonary/Chest: Effort normal. No respiratory distress. She has wheezes. She has no rales.  Mild end expiratory wheeze bilaterally  Abdominal: Soft. Bowel sounds are normal. She exhibits no distension. There is no tenderness.  There is no rebound.  Musculoskeletal: She exhibits no edema.  Neurological: She is alert.  Skin: Skin is warm and dry.  Psychiatric: She has a normal mood and affect. Her behavior is normal.  Nursing note and vitals reviewed.    ED Treatments / Results  Labs (all labs ordered are listed, but only abnormal results are displayed) Labs Reviewed - No data to display  EKG  EKG Interpretation None       Radiology Dg Chest 2 View  Result Date: 07/02/2017 CLINICAL DATA:  Cough, shortness of breath. EXAM: CHEST - 2 VIEW COMPARISON:  Radiographs of Aug 31, 2016. FINDINGS: The heart size and mediastinal contours are within normal limits. Both lungs are clear. No pneumothorax or pleural effusion is noted. The visualized skeletal structures are unremarkable. IMPRESSION: No active cardiopulmonary disease. Electronically Signed   By:  Marijo Conception, M.D.   On: 07/02/2017 13:10    Procedures Procedures (including critical care time)  Medications Ordered in ED Medications  albuterol (PROVENTIL HFA;VENTOLIN HFA) 108 (90 Base) MCG/ACT inhaler 2 puff (not administered)  AEROCHAMBER PLUS FLO-VU MEDIUM MISC 1 each (not administered)     Initial Impression / Assessment and Plan / ED Course  I have reviewed the triage vital signs and the nursing notes.  Pertinent labs & imaging results that were available during my care of the patient were reviewed by me and considered in my medical decision making (see chart for details).     Patient in emergency department with complaint of cough for about a month.  She does have mild and expiratory wheeze on the exam, otherwise normal examination.  She does have multiple emergency department visits, mainly for abdominal issues.  Her chest x-ray was obtained in triage and is negative.  Patient most likely has bronchitis, discussed smoking cessation.  Due to diabetes, will hold off on steroids.  She is not hypoxic and moving air well.  She is afebrile.  Will  provide with an inhaler and AeroChamber and show her how to use it.  Will give Tessalon for cough.  Follow-up with family doctor in 2 3 days  Vitals:   07/02/17 1239  BP: (!) 141/104  Pulse: (!) 105  Resp: 18  Temp: 98.2 F (36.8 C)  TempSrc: Oral  SpO2: 100%     Final Clinical Impressions(s) / ED Diagnoses   Final diagnoses:  Cough  Post-tussive emesis    ED Discharge Orders        Ordered    benzonatate (TESSALON) 100 MG capsule  Every 8 hours     07/02/17 1443       Jeannett Senior, PA-C 07/02/17 1444    Drenda Freeze, MD 07/05/17 [phone]

## 2017-07-02 NOTE — ED Triage Notes (Signed)
Pt to ER for evaluation of 2 months productive cough, states "I keep getting into these coughing spells." pt in NAD. States phlegm is white.

## 2017-07-04 ENCOUNTER — Emergency Department (HOSPITAL_COMMUNITY)
Admission: EM | Admit: 2017-07-04 | Discharge: 2017-07-04 | Disposition: A | Discharge status: Home / Self Care | Payer: Medicaid Other | Attending: Emergency Medicine | Admitting: Emergency Medicine

## 2017-07-04 ENCOUNTER — Emergency Department (HOSPITAL_COMMUNITY): Payer: Medicaid Other

## 2017-07-04 ENCOUNTER — Encounter (HOSPITAL_COMMUNITY): Payer: Self-pay | Admitting: Emergency Medicine

## 2017-07-04 ENCOUNTER — Other Ambulatory Visit: Payer: Self-pay

## 2017-07-04 DIAGNOSIS — J029 Acute pharyngitis, unspecified: Secondary | ICD-10-CM

## 2017-07-04 DIAGNOSIS — R110 Nausea: Secondary | ICD-10-CM | POA: Diagnosis not present

## 2017-07-04 DIAGNOSIS — Z79899 Other long term (current) drug therapy: Secondary | ICD-10-CM | POA: Diagnosis not present

## 2017-07-04 DIAGNOSIS — E119 Type 2 diabetes mellitus without complications: Secondary | ICD-10-CM | POA: Insufficient documentation

## 2017-07-04 DIAGNOSIS — I10 Essential (primary) hypertension: Secondary | ICD-10-CM | POA: Insufficient documentation

## 2017-07-04 DIAGNOSIS — R05 Cough: Secondary | ICD-10-CM | POA: Diagnosis not present

## 2017-07-04 DIAGNOSIS — Z794 Long term (current) use of insulin: Secondary | ICD-10-CM | POA: Insufficient documentation

## 2017-07-04 DIAGNOSIS — F17210 Nicotine dependence, cigarettes, uncomplicated: Secondary | ICD-10-CM | POA: Diagnosis not present

## 2017-07-04 DIAGNOSIS — R059 Cough, unspecified: Secondary | ICD-10-CM

## 2017-07-04 LAB — I-STAT BETA HCG BLOOD, ED (MC, WL, AP ONLY): I-stat hCG, quantitative: <5 m[IU]/mL (ref <5)

## 2017-07-04 LAB — RAPID STREP SCREEN (MED CTR MEBANE ONLY): STREPTOCOCCUS, GROUP A SCREEN (DIRECT): NEGATIVE

## 2017-07-04 NOTE — ED Provider Notes (Signed)
[facility] EMERGENCY DEPARTMENT Provider Note   CSN: [id number] Arrival date & time: 07/04/17  1902     History   Chief Complaint Chief Complaint  Patient presents with  . Sore Throat  . Cough  . Nausea    HPI [name] is a 27 y.o. female past medical history anxiety, bipolar 1, depression, diabetes who presents for evaluation of cough, nausea and sore throat.  Patient reports that she has had a lingering cough that has been ongoing for the last month.  She has had several visits to the emergency department for this cough.  She reports that sometimes she coughs so bad that she has an episode of emesis immediately after.  No blood noted.  Patient states that cough is worse after smoking.  Patient states that she has been able to eat and drink without difficulty.  Patient reports that she started having some sore throat that began this morning.  She reports that she is not taking any medication for the pain.  Patient reports that the pain is worsened by swallowing but that she is able to tolerate her secretions and tolerate p.o. without any difficulty.  Patient denies any fevers, chest pain, difficulty breathing, abdominal pain, facial swelling.  Patient states she does smoke approximately 3 packs of cigarettes a day.  The history is provided by the patient.    Past Medical History:  Diagnosis Date  . Anxiety   . Bipolar 1 disorder ([place])   . Cognitive deficits   . Depression   . Diabetes mellitus without complication ([place])   . Hypertension   . Mental disorder   . Obesity     Patient Active Problem List   Diagnosis Date Noted  . LGSIL on Pap smear of cervix 02/01/2017  . OCD (obsessive compulsive disorder) 10/05/2016  . Major depressive disorder, recurrent episode, mild ([place]) 05/04/2016  . 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 Learning disability 07/18/2015  . 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 Diabetes mellitus ([place]) 07/18/2015  .  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 Hyperlipidemia 07/18/2015  . Severe episode of recurrent major depressive disorder, without psychotic features ([place])   . Drug overdose   . Cognitive deficits 10/12/2012  . Generalized anxiety disorder 06/28/2012    Past Surgical History:  Procedure Laterality Date  . CESAREAN SECTION    . CESAREAN SECTION N/A 04/25/2013   Procedure: REPEAT CESAREAN SECTION;  Surgeon: [name], MD;  Location: [facility];  Service: Obstetrics;  Laterality: N/A;  . MASS EXCISION N/A 06/03/2012   Procedure: EXCISION MASS;  Surgeon: [name], MD;  Location: [facility];  Service: ENT;  Laterality: N/A;  Excision uvula mass  . TONSILLECTOMY N/A 06/03/2012   Procedure: TONSILLECTOMY;  Surgeon: [name], MD;  Location: [facility];  Service: ENT;  Laterality: N/A;  . TONSILLECTOMY      OB History    Gravida Para Term Preterm AB Living   3 3 3  0 0 3   SAB TAB Ectopic Multiple Live Births   0 0 0 0 3       Home Medications    Prior to Admission medications   Medication Sig Start Date End Date Taking? Authorizing Provider  benzonatate (TESSALON) 100 MG capsule Take 1 capsule (100 mg total) by mouth every 8 (eight) hours. 07/02/17   Kirichenko, Tatyana, PA-C  busPIRone (BUSPAR) 15 MG tablet Take 1 tablet (15 mg total) by  mouth 2 (two) times daily. For anxiety 10/06/16   Lindell Spar I, NP  DULoxetine (CYMBALTA) 60 MG capsule Take 60 mg daily by mouth.    [provider]  fluvoxaMINE (LUVOX) 50 MG tablet Take 100 mg by mouth at bedtime.  02/02/17   [provider]  glipiZIDE (GLUCOTROL XL) 10 MG 24 hr tablet Take 1 tablet (10 mg total) by mouth every evening. For diabetes management 10/06/16   Lindell Spar I, NP  hydrochlorothiazide (HYDRODIURIL) 25 MG tablet Take 1 TABLET BY MOUTH EVERY DAY for 30 days 02/15/17   [provider]  hydrOXYzine (VISTARIL) 25 MG  capsule Take 25 mg 3 (three) times daily as needed by mouth. "to shave my hair" 02/02/17   [provider]  LEVEMIR FLEXTOUCH 100 UNIT/ML Pen Inject 30 units at bedtime 02/10/17   [provider]  metFORMIN (GLUCOPHAGE) 500 MG tablet Take 1 tablet (500 mg total) by mouth 2 (two) times daily with a meal. For diabetes management 10/06/16   Lindell Spar I, NP  omeprazole (PRILOSEC) 20 MG capsule Take 1 capsule (20 mg total) by mouth daily. For acid reflux 10/06/16   Lindell Spar I, NP  paliperidone (INVEGA SUSTENNA) 234 MG/1.5ML SUSP injection Inject 234 mg every 30 (thirty) days into the muscle.    [provider]    Family History Family History  Problem Relation Age of Onset  . Hypertension Mother   . Diabetes Father     Social History Social History   Tobacco Use  . Smoking status: Former Smoker    Packs/day: 1.00    Years: 7.00    Pack years: 7.00    Types: Cigarettes    Last attempt to quit: 02/14/2017    Years since quitting: 0.3  . Smokeless tobacco: Never Used  Substance Use Topics  . Alcohol use: No  . Drug use: No    Comment: Patient denies     Allergies   Wellbutrin [bupropion]; Omnipaque [iohexol]; and Penicillins   Review of Systems Review of Systems  Constitutional: Negative for fever.  HENT: Positive for sore throat. Negative for drooling, facial swelling and trouble swallowing.   Respiratory: Positive for cough. Negative for shortness of breath.   Cardiovascular: Negative for chest pain.  Gastrointestinal: Positive for nausea. Negative for abdominal pain and vomiting.  Genitourinary: Negative for dysuria and hematuria.  Neurological: Negative for headaches.     Physical Exam Updated Vital Signs BP 136/89 (BP Location: Right Arm)   Pulse (!) 107   Temp 98.8 F (37.1 C) (Oral)   Resp 18   Ht 5\' 7"  (1.702 m)   Wt 116.1 kg (256 lb)   SpO2 98%   BMI 40.10 kg/m   Physical Exam  Constitutional: She is oriented to person,  place, and time. She appears well-developed and well-nourished.  HENT:  Head: Normocephalic and atraumatic.  Nose: Mucosal edema present.  Mouth/Throat: Uvula is midline and mucous membranes are normal. No trismus in the jaw. Posterior oropharyngeal erythema present.  Posterior oropharynx with slight erythema but no evidence of exudates, edema.  Uvula is midline.  No trismus.  No face or neck swelling.  Airway is patent, phonation is intact.  Eyes: Conjunctivae, EOM and lids are normal. Pupils are equal, round, and reactive to light.  Neck: Full passive range of motion without pain.  Cardiovascular: Normal rate, regular rhythm, normal heart sounds and normal pulses. Exam reveals no gallop and no friction rub.  No murmur heard. Pulmonary/Chest: Effort  normal and breath sounds normal.  No evidence of respiratory distress. Able to speak in full sentences without difficulty.  Abdominal: Soft. Normal appearance. There is no tenderness. There is no rigidity and no guarding.  Abdomen is soft, nondistended, nontender.  Musculoskeletal: Normal range of motion.  Neurological: She is alert and oriented to person, place, and time.  Skin: Skin is warm and dry. Capillary refill takes less than 2 seconds.  Psychiatric: She has a normal mood and affect. Her speech is normal.  Nursing note and vitals reviewed.    ED Treatments / Results  Labs (all labs ordered are listed, but only abnormal results are displayed) Labs Reviewed  RAPID STREP SCREEN (NOT AT [facility])  CULTURE, GROUP A STREP ([place])  I-STAT BETA HCG BLOOD, ED ([facility], [facility], [facility] ONLY)    EKG  EKG Interpretation None       Radiology Dg Chest 2 View  Result Date: 07/04/2017 CLINICAL DATA:  Sore throat and cough for 1 month EXAM: CHEST - 2 VIEW COMPARISON:  07/02/2017 FINDINGS: The heart size and mediastinal contours are within normal limits. Both lungs are clear. The visualized skeletal structures are unremarkable. IMPRESSION: No active  cardiopulmonary disease. Electronically Signed   By: Inez Catalina M.D.   On: 07/04/2017 20:00    Procedures Procedures (including critical care time)  Medications Ordered in ED Medications - No data to display   Initial Impression / Assessment and Plan / ED Course  I have reviewed the triage vital signs and the nursing notes.  Pertinent labs & imaging results that were available during my care of the patient were reviewed by me and considered in my medical decision making (see chart for details).     27 year old female who presents for evaluation of cough, sore throat.  Patient also reports episodes of vomiting, though through history, it sounds like she only has these episodes of vomiting after coughing so suspect that this is likely secondary to posttussive emesis.  With coughing is aggravated by smoking and patient reports she smokes 1-2 packs a day.  Sore throat began today.  No fevers. Patient is afebrile, non-toxic appearing, sitting comfortably on examination table. Vital signs reviewed and stable.  On exam, posterior oropharynx is slightly erythematous but no exudate, edema.  Uvula is midline.  No trismus.  History/physical exam is not concerning for peritonsillar abscess, Ludwig angina.  Abdomen is soft, nondistended, nontender.  Patient with no evidence of respiratory distress.  Suspect that symptoms may be related to upper respiratory infection versus cough from smoking.  Patient has an active care plan and has had 16 visits in the last month.  Most recently, she was seen in the ED on 07/02/17 for evaluation of similar symptoms.  Workup was unremarkable at that time.  We will plan to check rapid strep, chest x-ray.  Chest x-ray reviewed.  Negative for any acute pneumonia.  Rapid strep is negative for any acute abnormalities.  Patient is tolerating p.o. in the department without any difficulty.  Vital signs are stable.  I discussed results with patient.  She has not had any more episodes  of vomiting since being in the ED and she has drank 2 drinks and even part of a sandwich here.  Patient is slightly tachycardic but appears to be that this is her baseline.  Repeat abdominal exam is benign.  Not concerning for any perforation, appendicitis, diverticulitis.  Counseled patient on smoking cessation. Instructed patient to follow-up with primary care doctor in the next  24-48 hours for further evaluation. Patient had ample opportunity for questions and discussion. All patient's questions were answered with full understanding. Strict return precautions discussed. Patient expresses understanding and agreement to plan.    Final Clinical Impressions(s) / ED Diagnoses   Final diagnoses:  Viral pharyngitis  Cough    ED Discharge Orders    None       Desma Mcgregor 07/04/17 2101    [name], MD 07/05/17 [phone]

## 2017-07-04 NOTE — ED Notes (Signed)
Pt given sprite to drink. 

## 2017-07-04 NOTE — ED Triage Notes (Signed)
C/o sore throat, cough, and nausea x 1 month.

## 2017-07-04 NOTE — Discharge Instructions (Signed)
Take the [facility] you were previously prescribed.   Make sure you are drinking plenty of fluids and staying hydrated.   Follow up with your primary care doctor or the referred [facility]: Lambert Rhonda W.  Return to the emergency department for any fever, chest pain, difficulty breathing, abdominal pain, vomiting or any other worsening or concerning symptoms.

## 2017-07-07 ENCOUNTER — Emergency Department (HOSPITAL_COMMUNITY)
Admission: EM | Admit: 2017-07-07 | Discharge: 2017-07-08 | Disposition: A | Discharge status: Other Acute Inpatient Hospital | Payer: Medicaid Other | Attending: Emergency Medicine | Admitting: Emergency Medicine

## 2017-07-07 ENCOUNTER — Other Ambulatory Visit: Payer: Self-pay

## 2017-07-07 ENCOUNTER — Encounter (HOSPITAL_COMMUNITY): Payer: Self-pay | Admitting: Emergency Medicine

## 2017-07-07 DIAGNOSIS — Z79899 Other long term (current) drug therapy: Secondary | ICD-10-CM | POA: Diagnosis not present

## 2017-07-07 DIAGNOSIS — Z7984 Long term (current) use of oral hypoglycemic drugs: Secondary | ICD-10-CM | POA: Insufficient documentation

## 2017-07-07 DIAGNOSIS — Z87891 Personal history of nicotine dependence: Secondary | ICD-10-CM | POA: Insufficient documentation

## 2017-07-07 DIAGNOSIS — I10 Essential (primary) hypertension: Secondary | ICD-10-CM | POA: Insufficient documentation

## 2017-07-07 DIAGNOSIS — E119 Type 2 diabetes mellitus without complications: Secondary | ICD-10-CM | POA: Diagnosis not present

## 2017-07-07 DIAGNOSIS — F329 Major depressive disorder, single episode, unspecified: Secondary | ICD-10-CM | POA: Diagnosis present

## 2017-07-07 DIAGNOSIS — R45851 Suicidal ideations: Secondary | ICD-10-CM

## 2017-07-07 DIAGNOSIS — F3132 Bipolar disorder, current episode depressed, moderate: Secondary | ICD-10-CM | POA: Diagnosis not present

## 2017-07-07 LAB — COMPREHENSIVE METABOLIC PANEL
ALBUMIN: 4 g/dL (ref 3.5–5.0)
ALK PHOS: 47 U/L (ref 38–126)
ALT: 29 U/L (ref 14–54)
ANION GAP: 14 (ref 5–15)
AST: 38 U/L (ref 15–41)
BUN: 9 mg/dL (ref 6–20)
CALCIUM: 9.1 mg/dL (ref 8.9–10.3)
CO2: 22 mmol/L (ref 22–32)
Chloride: 103 mmol/L (ref 101–111)
Creatinine, Ser: 0.92 mg/dL (ref 0.44–1.00)
GFR calc Af Amer: >60 mL/min (ref >60)
GFR calc non Af Amer: >60 mL/min (ref >60)
GLUCOSE: 234 mg/dL — AB (ref 65–99)
Potassium: 3.2 mmol/L — ABNORMAL LOW (ref 3.5–5.1)
SODIUM: 139 mmol/L (ref 135–145)
Total Bilirubin: 0.5 mg/dL (ref 0.3–1.2)
Total Protein: 6.8 g/dL (ref 6.5–8.1)

## 2017-07-07 LAB — RAPID URINE DRUG SCREEN, HOSP PERFORMED
Amphetamines: NOT DETECTED
BARBITURATES: NOT DETECTED
BENZODIAZEPINES: NOT DETECTED
Cocaine: NOT DETECTED
Opiates: NOT DETECTED
TETRAHYDROCANNABINOL: NOT DETECTED

## 2017-07-07 LAB — CULTURE, GROUP A STREP (THRC)

## 2017-07-07 LAB — CBC
HEMATOCRIT: 38.7 % (ref 36.0–46.0)
HEMOGLOBIN: 12.6 g/dL (ref 12.0–15.0)
MCH: 26.6 pg (ref 26.0–34.0)
MCHC: 32.6 g/dL (ref 30.0–36.0)
MCV: 81.8 fL (ref 78.0–100.0)
Platelets: 243 10*3/uL (ref 150–400)
RBC: 4.73 MIL/uL (ref 3.87–5.11)
RDW: 13.9 % (ref 11.5–15.5)
WBC: 10.4 10*3/uL (ref 4.0–10.5)

## 2017-07-07 LAB — ACETAMINOPHEN LEVEL: Acetaminophen (Tylenol), Serum: <10 ug/mL — ABNORMAL LOW (ref 10–30)

## 2017-07-07 LAB — I-STAT BETA HCG BLOOD, ED (MC, WL, AP ONLY): I-stat hCG, quantitative: <5 m[IU]/mL (ref <5)

## 2017-07-07 LAB — SALICYLATE LEVEL: Salicylate Lvl: <7 mg/dL (ref 2.8–30.0)

## 2017-07-07 LAB — ETHANOL: Alcohol, Ethyl (B): <10 mg/dL (ref <10)

## 2017-07-07 MED ORDER — POTASSIUM CHLORIDE CRYS ER 20 MEQ PO TBCR
40.0000 meq | EXTENDED_RELEASE_TABLET | Freq: Once | ORAL | Status: AC
  Administered 2017-07-08: 40 meq via ORAL
  Filled 2017-07-07: qty 2

## 2017-07-07 NOTE — ED Provider Notes (Signed)
[facility] EMERGENCY DEPARTMENT Provider Note   CSN: [id number] Arrival date & time: 07/07/17  1854     History   Chief Complaint Chief Complaint  Patient presents with  . Suicidal    HPI [name] is a 27 y.o. female.  HPI [name] is a 27 y.o. female with a history of bipolar 1, cognitive deficits, DM 2, hypertension, obesity, OCD, history of suicidal attempts, and chronic abdominal pain presents today for evaluation of suicidal ideation.  She made statements upon arrival that she plans to overdose on pills and that she "likes needles also."    To overdose on her insulin depression medications.  History of same.  Of note patient has been seen 16 times in the past 6 months for similar symptoms.  She has been evaluated by TTS several times for same.  Patient is followed by [facility].  Reports taking her medications as prescribed.  States her blood sugars run between 2 and 300.  Pt denies any fever, chill, ha, vision changes, lightheadedness, dizziness, congestion, neck pain, cp, sob, cough, abd pain, n/v/d, urinary symptoms, change in bowel habits, melena, hematochezia, lower extremity paresthesias.      Past Medical History:  Diagnosis Date  . Anxiety   . Bipolar 1 disorder ([place])   . Cognitive deficits   . Depression   . Diabetes mellitus without complication ([place])   . Hypertension   . Mental disorder   . Obesity     Patient Active Problem List   Diagnosis Date Noted  . LGSIL on Pap smear of cervix 02/01/2017  . OCD (obsessive compulsive disorder) 10/05/2016  . Major depressive disorder, recurrent episode, mild ([place]) 05/04/2016  . 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 Learning disability 07/18/2015  . 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 Diabetes mellitus ([place]) 07/18/2015  . MDD (major depressive disorder), recurrent, severe, with psychosis ([place]) 07/18/2015  . Hyperlipidemia 07/18/2015  . Severe episode of recurrent major  depressive disorder, without psychotic features ([place])   . Drug overdose   . Cognitive deficits 10/12/2012  . Generalized anxiety disorder 06/28/2012    Past Surgical History:  Procedure Laterality Date  . CESAREAN SECTION    . CESAREAN SECTION N/A 04/25/2013   Procedure: REPEAT CESAREAN SECTION;  Surgeon: [name], MD;  Location: [facility];  Service: Obstetrics;  Laterality: N/A;  . MASS EXCISION N/A 06/03/2012   Procedure: EXCISION MASS;  Surgeon: [name], MD;  Location: [facility];  Service: ENT;  Laterality: N/A;  Excision uvula mass  . TONSILLECTOMY N/A 06/03/2012   Procedure: TONSILLECTOMY;  Surgeon: [name], MD;  Location: [place];  Service: ENT;  Laterality: N/A;  . TONSILLECTOMY      OB History    Gravida Para Term Preterm AB Living   3 3 3  0 0 3   SAB TAB Ectopic Multiple Live Births   0 0 0 0 3       Home Medications    Prior to Admission medications   Medication Sig Start Date End Date Taking? Authorizing Provider  benzonatate (TESSALON) 100 MG capsule Take 1 capsule (100 mg total) by mouth every 8 (eight) hours. 07/02/17   Kirichenko, Tatyana, PA-C  busPIRone (BUSPAR) 15 MG tablet Take 1 tablet (15 mg total) by mouth 2 (two) times daily. For anxiety 10/06/16   Lindell Spar I, NP  DULoxetine (CYMBALTA) 60 MG capsule Take 60 mg daily by mouth.    [provider]  fluvoxaMINE (LUVOX) 50 MG tablet Take 100 mg by mouth at bedtime.  02/02/17   [provider]  glipiZIDE (GLUCOTROL XL) 10 MG 24 hr tablet Take 1 tablet (10 mg total) by mouth every evening. For diabetes management 10/06/16   Lindell Spar I, NP  hydrochlorothiazide (HYDRODIURIL) 25 MG tablet Take 1 TABLET BY MOUTH EVERY DAY for 30 days 02/15/17   [provider]  hydrOXYzine (VISTARIL) 25 MG capsule Take 25 mg 3 (three) times daily as needed by mouth. "to shave my hair" 02/02/17   [provider]  LEVEMIR FLEXTOUCH 100 UNIT/ML Pen  Inject 30 units at bedtime 02/10/17   [provider]  metFORMIN (GLUCOPHAGE) 500 MG tablet Take 1 tablet (500 mg total) by mouth 2 (two) times daily with a meal. For diabetes management 10/06/16   Lindell Spar I, NP  omeprazole (PRILOSEC) 20 MG capsule Take 1 capsule (20 mg total) by mouth daily. For acid reflux 10/06/16   Lindell Spar I, NP  paliperidone (INVEGA SUSTENNA) 234 MG/1.5ML SUSP injection Inject 234 mg every 30 (thirty) days into the muscle.    [provider]    Family History Family History  Problem Relation Age of Onset  . Hypertension Mother   . Diabetes Father     Social History Social History   Tobacco Use  . Smoking status: Former Smoker    Packs/day: 1.00    Years: 7.00    Pack years: 7.00    Types: Cigarettes    Last attempt to quit: 02/14/2017    Years since quitting: 0.3  . Smokeless tobacco: Never Used  Substance Use Topics  . Alcohol use: No  . Drug use: No    Comment: Patient denies     Allergies   Wellbutrin [bupropion]; Omnipaque [iohexol]; and Penicillins   Review of Systems Review of Systems  All other systems reviewed and are negative.    Physical Exam Updated Vital Signs BP (!) 146/99 (BP Location: Right Arm)   Temp 98.2 F (36.8 C)   Resp 19   Ht 5\' 7"  (1.702 m)   Wt 113.4 kg (250 lb)   SpO2 99%   BMI 39.16 kg/m   Physical Exam  Constitutional: She is oriented to person, place, and time. She appears well-developed and well-nourished. No distress.  HENT:  Head: Normocephalic and atraumatic.  Eyes: Right eye exhibits no discharge. Left eye exhibits no discharge. No scleral icterus.  Neck: Normal range of motion. Neck supple.  Cardiovascular: Normal rate, regular rhythm, normal heart sounds and intact distal pulses.  Pulmonary/Chest: Effort normal and breath sounds normal. No respiratory distress.  Abdominal: Soft. Bowel sounds are normal. She exhibits no distension. There is no tenderness. There is no  rebound and no guarding.  Musculoskeletal: Normal range of motion.  Neurological: She is alert and oriented to person, place, and time.  Skin: Skin is warm and dry. Capillary refill takes less than 2 seconds. No pallor.  Psychiatric: She has a normal mood and affect. Her speech is normal and behavior is normal. Judgment normal. She expresses suicidal ideation. She expresses no homicidal ideation. She expresses suicidal plans. She expresses no homicidal plans.  Nursing note and vitals reviewed.    ED Treatments / Results  Labs (all labs ordered are listed, but only abnormal results are displayed) Labs Reviewed  COMPREHENSIVE METABOLIC PANEL - Abnormal; Notable for the following components:      Result Value   Potassium 3.2 (*)    Glucose, Bld  234 (*)    All other components within normal limits  ACETAMINOPHEN LEVEL - Abnormal; Notable for the following components:   Acetaminophen (Tylenol), Serum <10 (*)    All other components within normal limits  ETHANOL  SALICYLATE LEVEL  CBC  RAPID URINE DRUG SCREEN, HOSP PERFORMED  I-STAT BETA HCG BLOOD, ED ([facility], [facility], [facility] ONLY)    EKG  EKG Interpretation None       Radiology No results found.  Procedures Procedures (including critical care time)  Medications Ordered in ED Medications - No data to display   Initial Impression / Assessment and Plan / ED Course  I have reviewed the triage vital signs and the nursing notes.  Pertinent labs & imaging results that were available during my care of the patient were reviewed by me and considered in my medical decision making (see chart for details).     Patient presents to the emergency department today for evaluation of suicidal ideations with plan to overdose on her insulin and depression medications.  History of same.  Has been seen by TTS several times for same.  Patient is overall well-appearing and nontoxic.  Vital signs are reassuring.  Medical screening lab work was reassuring for  any acute findings.  Glucose mildly elevated with history of same.  Patient's with a slight hypokalemia at 3.2 which was replaced with oral potassium.  No signs of DKA.  Given reassuring vital signs, lab work and physical exam patient can be cleared for TTS evaluation and disposition at this time.  She is currently calm and cooperative at this time.  Final Clinical Impressions(s) / ED Diagnoses   Final diagnoses:  Suicidal ideation    ED Discharge Orders    None       Aaron Edelman 31/49/70 [id number]    Delora Fuel, MD 85/88/50 [phone]

## 2017-07-07 NOTE — ED Notes (Signed)
Belongings removed, pt placed in paper scrubs.  Security wanded pt.  Per staffing no sitters at this time. Charge RN aware.

## 2017-07-07 NOTE — ED Triage Notes (Signed)
BIB [facility] for SI. Pt reports she wants to OD on her insulin and depression meds. Pt highly agitated in triage, yelling. Pt denies drug/ETOH use.

## 2017-07-08 LAB — CBG MONITORING, ED: Glucose-Capillary: 201 mg/dL — ABNORMAL HIGH (ref 65–99)

## 2017-07-08 MED ORDER — LORAZEPAM 1 MG PO TABS
1.0000 mg | ORAL_TABLET | Freq: Once | ORAL | Status: AC
  Administered 2017-07-08: 1 mg via ORAL
  Filled 2017-07-08: qty 1

## 2017-07-08 MED ORDER — METFORMIN HCL 500 MG PO TABS
500.0000 mg | ORAL_TABLET | Freq: Two times a day (BID) | ORAL | Status: DC
  Administered 2017-07-08: 500 mg via ORAL
  Filled 2017-07-08: qty 1

## 2017-07-08 MED ORDER — GLIPIZIDE ER 10 MG PO TB24
10.0000 mg | ORAL_TABLET | Freq: Every day | ORAL | Status: DC
  Filled 2017-07-08: qty 1

## 2017-07-08 MED ORDER — FLUVOXAMINE MALEATE 50 MG PO TABS: 100.0000 mg | ORAL_TABLET | Freq: Every day | ORAL | Status: DC

## 2017-07-08 MED ORDER — HYDROCHLOROTHIAZIDE 25 MG PO TABS
25.0000 mg | ORAL_TABLET | Freq: Every day | ORAL | Status: DC
  Administered 2017-07-08: 25 mg via ORAL
  Filled 2017-07-08: qty 1

## 2017-07-08 MED ORDER — PANTOPRAZOLE SODIUM 40 MG PO TBEC
40.0000 mg | DELAYED_RELEASE_TABLET | Freq: Every day | ORAL | Status: DC
  Administered 2017-07-08: 40 mg via ORAL
  Filled 2017-07-08: qty 1

## 2017-07-08 MED ORDER — PALIPERIDONE PALMITATE 234 MG/1.5ML IM SUSP
234.0000 mg | INTRAMUSCULAR | Status: DC
  Administered 2017-07-08: 234 mg via INTRAMUSCULAR
  Filled 2017-07-08 (×2): qty 1.5

## 2017-07-08 MED ORDER — INSULIN DETEMIR 100 UNIT/ML ~~LOC~~ SOLN
30.0000 [IU] | Freq: Every day | SUBCUTANEOUS | Status: DC
  Administered 2017-07-08: 25 [IU] via SUBCUTANEOUS
  Filled 2017-07-08 (×2): qty 0.3

## 2017-07-08 MED ORDER — LORAZEPAM 2 MG/ML IJ SOLN: 1.0000 mg | Freq: Once | INTRAMUSCULAR | Status: DC

## 2017-07-08 MED ORDER — ACETAMINOPHEN 325 MG PO TABS: 650.0000 mg | ORAL_TABLET | ORAL | Status: DC | PRN

## 2017-07-08 MED ORDER — DULOXETINE HCL 60 MG PO CPEP
60.0000 mg | ORAL_CAPSULE | Freq: Every day | ORAL | Status: DC
  Administered 2017-07-08: 60 mg via ORAL
  Filled 2017-07-08: qty 1

## 2017-07-08 MED ORDER — BUSPIRONE HCL 10 MG PO TABS
15.0000 mg | ORAL_TABLET | Freq: Two times a day (BID) | ORAL | Status: DC
  Administered 2017-07-08: 15 mg via ORAL
  Filled 2017-07-08: qty 2

## 2017-07-08 NOTE — ED Notes (Signed)
Belongings including 2 white belongings bag and white security envelope given to [employer] transportation.

## 2017-07-08 NOTE — Progress Notes (Signed)
Pt accepted to [facility].  Dr. Dareen Piano is the attending provider.  Call report to [phone]  Pt is Voluntary.  Pt may be transported by [facility] Pt scheduled  to arrive at [facility] as soon as transport can be arranged. Chelsea @[facility]  ED notified  [name]. Judi Cong, MSW, [place] Disposition Clinical Social Work [phone] (cell) [phone] (office)

## 2017-07-08 NOTE — ED Notes (Signed)
[name] called this RN stating she is one of the team members that manage this patient and she is supposed to be seen at [facility] today for invega injection. Kim requesting she be called with updates one pt has placement. [phone]

## 2017-07-08 NOTE — ED Notes (Signed)
TTS updates me that the Recommendation is Inpatient placement.  She is under review at [facility] (will be referred out if needed)

## 2017-07-08 NOTE — Progress Notes (Signed)
Pt. meets criteria for inpatient treatment per [name], PA.  Referred out to the following hospitals:  [facility]  [facility]  [facility]  [facility]  [facility]  [facility]                  Disposition CSW will continue to follow for placement.  [name]. Judi Cong, MSW, [place] Disposition Clinical Social Work [phone] (cell) [phone] (office)

## 2017-07-08 NOTE — ED Notes (Signed)
Pt attempted to leave to department but was returned to room with staff. Pt states she is feeling very anxious and would like something to help her calm down. Dr. Wilson Singer aware and medications ordered.

## 2017-07-08 NOTE — ED Notes (Signed)
Regular Diet ordered for Dinner. 

## 2017-07-08 NOTE — ED Notes (Signed)
Pt lunch tray at bedside  

## 2017-07-08 NOTE — BH Assessment (Addendum)
Tele Assessment Note   Patient Name: [name] MRN: [id number] Referring Physician: Ocie Cornfield, PA-C Location of Patient: [facility] Location of Provider: [place] is an 27 y.o. female who presents voluntarily to [facility] alone BIB [facility] reporting symptoms of depression and suicidal ideation. Pt has a history of Bipolar disorder, OCD and suicidal ideation. Pt reports current suicidal ideation with plans of overdosing on her medications. Past attempts include overdosing. Pt acknowledges symptoms including: sadness, fatigue, low self esteem, negative outlook, difficulty concentrating, helplessness, sleeping less, eating less.  Pt also reports feelings of anxiety and occasional nightmares.  Pt denies homicidal ideation/ history of violence. Pt denies auditory or visual hallucinations or other psychotic symptoms. Pt states current stressors includes that she thinks she has ADHD, cannot be still and that she keeps shaving her head and cannot stop.   Pt lives alone and supports include her mother and sister.  Pt denies history of abuse and trauma. Pt reports there is a family history of MH. Pt is currently unemployed. Pt has poor insight and impaired judgment. Pt's memory is intact.  Pt denies legal history.  Pt's OP history includes receiving outpatient therapy and medication management at [facility]. IP history includes multiple admissions, last admission at [facility] in June 2018.  Pt denies alcohol/ substance abuse.  Pt is dressed in scrubs, alert, oriented x4 with normal speech and normal motor behavior. Eye contact is good. Pt's mood is depressed and sad and affect is depressed and sad. Affect is congruent with mood. Thought process is coherent and relevant. There is no indication Pt is currently responding to internal stimuli or experiencing delusional thought content. Pt was cooperative throughout assessment. Pt is currently able to contract for safety outside the  hospital.   Diagnosis: F31.32 Bipolar I disorder, Current or most recent episode depressed, Moderate, by history  Past Medical History:  Past Medical History:  Diagnosis Date  . Anxiety   . Bipolar 1 disorder ([place])   . Cognitive deficits   . Depression   . Diabetes mellitus without complication ([place])   . Hypertension   . Mental disorder   . Obesity     Past Surgical History:  Procedure Laterality Date  . CESAREAN SECTION    . CESAREAN SECTION N/A 04/25/2013   Procedure: REPEAT CESAREAN SECTION;  Surgeon: [name], MD;  Location: [facility];  Service: Obstetrics;  Laterality: N/A;  . MASS EXCISION N/A 06/03/2012   Procedure: EXCISION MASS;  Surgeon: [name], MD;  Location: [facility];  Service: ENT;  Laterality: N/A;  Excision uvula mass  . TONSILLECTOMY N/A 06/03/2012   Procedure: TONSILLECTOMY;  Surgeon: [name], MD;  Location: [facility];  Service: ENT;  Laterality: N/A;  . TONSILLECTOMY      Family History:  Family History  Problem Relation Age of Onset  . Hypertension Mother   . Diabetes Father     Social History:  reports that she quit smoking about 4 months ago. Her smoking use included cigarettes. She has a 7.00 pack-year smoking history. she has never used smokeless tobacco. She reports that she does not drink alcohol or use drugs.  Additional Social History:  Alcohol / Drug Use Pain Medications: See MAR Prescriptions: See MAR Over the Counter: See MAR History of alcohol / drug use?: No history of alcohol / drug abuse Longest period of sobriety (when/how long): NA  CIWA: CIWA-Ar BP: 132/88 Pulse Rate: (!) 114 COWS:  Allergies:  Allergies  Allergen Reactions  . Wellbutrin [Bupropion] Shortness Of Breath  . Omnipaque [Iohexol] Swelling and Other (See Comments)    Reaction:  Eye swelling  . Penicillins Hives and Other (See Comments)    Has patient had a PCN reaction causing immediate rash, facial/tongue/throat  swelling, SOB or lightheadedness with hypotension: No Has patient had a PCN reaction causing severe rash involving mucus membranes or skin necrosis: No Has patient had a PCN reaction that required hospitalization No Has patient had a PCN reaction occurring within the last 10 years: No If all of the above answers are "NO", then may proceed with Cephalosporin use.    Home Medications:  (Not in a hospital admission)  OB/GYN Status:  No LMP recorded. Patient has had an implant.  General Assessment Data Location of Assessment: [facility] ED TTS Assessment: In system Is this a Tele or Face-to-Face Assessment?: Tele Assessment Is this an Initial Assessment or a Re-assessment for this encounter?: Initial Assessment Marital status: Single Maiden name: NA Is patient pregnant?: No Pregnancy Status: No Living Arrangements: Alone Can pt return to current living arrangement?: Yes Admission Status: Voluntary Is patient capable of signing voluntary admission?: Yes Referral Source: Self/Family/Friend Insurance type: Medicaid     Crisis Care Plan Living Arrangements: Alone Name of Psychiatrist: Warden/ranger Name of Therapist: Warden/ranger  Education Status Is patient currently in school?: No Highest grade of school patient has completed: 11th grade Is the patient employed, unemployed or receiving disability?: Unemployed  Risk to self with the past 6 months Suicidal Ideation: Yes-Currently Present Has patient been a risk to self within the past 6 months prior to admission? : Yes Suicidal Intent: Yes-Currently Present Has patient had any suicidal intent within the past 6 months prior to admission? : Yes Is patient at risk for suicide?: Yes Suicidal Plan?: Yes-Currently Present Has patient had any suicidal plan within the past 6 months prior to admission? : Yes Specify Current Suicidal Plan: Pt states to overdose Access to Means: Yes Specify Access to Suicidal Means: Pt has access to her medications What  has been your use of drugs/alcohol within the last 12 months?: Pt denies Previous Attempts/Gestures: Yes Other Self Harm Risks: Pt denies Triggers for Past Attempts: Unpredictable, Other personal contacts Intentional Self Injurious Behavior: None Family Suicide History: No Recent stressful life event(s): Other (Comment)(Pt states she can't be still and keeps shaving her head) Persecutory voices/beliefs?: No Depression: Yes Depression Symptoms: Despondent, Insomnia, Feeling worthless/self pity, Fatigue Substance abuse history and/or treatment for substance abuse?: No Suicide prevention information given to non-admitted patients: Not applicable  Risk to Others within the past 6 months Homicidal Ideation: No Does patient have any lifetime risk of violence toward others beyond the six months prior to admission? : No Thoughts of Harm to Others: No Current Homicidal Intent: No Current Homicidal Plan: No Access to Homicidal Means: No Identified Victim: Pt denies History of harm to others?: No Assessment of Violence: None Noted Violent Behavior Description: Pt denies Does patient have access to weapons?: No Criminal Charges Pending?: No Does patient have a court date: No Is patient on probation?: No  Psychosis Hallucinations: None noted Delusions: None noted  Mental Status Report Appearance/Hygiene: In scrubs, Unremarkable Eye Contact: Good Motor Activity: Freedom of movement Speech: Logical/coherent Level of Consciousness: Alert Mood: Sad, Depressed Affect: Sad, Depressed Anxiety Level: Minimal Thought Processes: Coherent, Relevant Judgement: Impaired Orientation: Person, Place, Time, Situation, Appropriate for developmental age Obsessive Compulsive Thoughts/Behaviors: Minimal  Cognitive Functioning Concentration: Normal Memory:  Recent Intact, Remote Intact Is patient IDD: Yes Level of Function: Below average Is patient DD?: No I IQ score available?: No Insight:  Poor Impulse Control: Poor Appetite: Good Have you had any weight changes? : Loss Amount of the weight change? (lbs): 28 lbs Sleep: Decreased Total Hours of Sleep: 4 Vegetative Symptoms: None  ADLScreening [facility] Assessment Services) Patient's cognitive ability adequate to safely complete daily activities?: Yes Patient able to express need for assistance with ADLs?: Yes Independently performs ADLs?: Yes (appropriate for developmental age)  Prior Inpatient Therapy Prior Inpatient Therapy: Yes Prior Therapy Dates: 2006 off and on through 2018 Prior Therapy Facilty/Provider(s): [facility] (multiple), [facility], [place] Reason for Treatment: Bipolar disorder, SI  Prior Outpatient Therapy Prior Outpatient Therapy: Yes Prior Therapy Dates: currently Prior Therapy Facilty/Provider(s): [facility] Reason for Treatment: med management and talk therapy Does patient have an ACCT team?: No Does patient have Intensive In-House Services?  : No Does patient have [facility] services? : No Does patient have P4CC services?: No  ADL Screening (condition at time of admission) Patient's cognitive ability adequate to safely complete daily activities?: Yes Is the patient deaf or have difficulty hearing?: No Does the patient have difficulty seeing, even when wearing glasses/contacts?: No Does the patient have difficulty concentrating, remembering, or making decisions?: No Patient able to express need for assistance with ADLs?: Yes Does the patient have difficulty dressing or bathing?: No Independently performs ADLs?: Yes (appropriate for developmental age) Does the patient have difficulty walking or climbing stairs?: No Weakness of Legs: None Weakness of Arms/Hands: None  Home Assistive Devices/Equipment Home Assistive Devices/Equipment: None    Abuse/Neglect Assessment (Assessment to be complete while patient is alone) Abuse/Neglect Assessment Can Be Completed: Yes Physical Abuse:  Denies Verbal Abuse: Denies Sexual Abuse: Denies Exploitation of patient/patient's resources: Denies Self-Neglect: Denies     Regulatory affairs officer (For Healthcare) Does Patient Have a Medical Advance Directive?: No Would patient like information on creating a medical advance directive?: No - Patient declined          Disposition: Gave clinical report to [name], PA who stated Pt meets criteria for inpatient psychiatric treatment.  Tori, RN and [facility] at [facility] stated may be able to be accepted pending a discharge. If no discharges, TTS will seek placement.  Notified Dorian Pod, RN and [place], [place] of recommendation. Disposition Initial Assessment Completed for this Encounter: Yes  This service was provided via telemedicine using a 2-way, interactive audio and video technology.  Names of all persons participating in this telemedicine service and their role in this encounter. Name: [name] Role: Patient  Name: Abran Cantor, MS, [facility] Role: TTS Counselor  Name:  Role:   Name:  Role:    Abran Cantor, [place], [facility] Therapeutic Triage Specialist   Abran Cantor 07/08/2017 12:03 AM

## 2017-07-20 ENCOUNTER — Encounter (HOSPITAL_COMMUNITY): Payer: Self-pay

## 2017-07-20 ENCOUNTER — Other Ambulatory Visit: Payer: Self-pay

## 2017-07-20 DIAGNOSIS — R350 Frequency of micturition: Secondary | ICD-10-CM | POA: Diagnosis present

## 2017-07-20 DIAGNOSIS — Z87891 Personal history of nicotine dependence: Secondary | ICD-10-CM | POA: Diagnosis not present

## 2017-07-20 DIAGNOSIS — I10 Essential (primary) hypertension: Secondary | ICD-10-CM | POA: Diagnosis not present

## 2017-07-20 DIAGNOSIS — R45851 Suicidal ideations: Secondary | ICD-10-CM | POA: Insufficient documentation

## 2017-07-20 DIAGNOSIS — F3132 Bipolar disorder, current episode depressed, moderate: Secondary | ICD-10-CM | POA: Diagnosis not present

## 2017-07-20 DIAGNOSIS — Z7984 Long term (current) use of oral hypoglycemic drugs: Secondary | ICD-10-CM | POA: Diagnosis not present

## 2017-07-20 DIAGNOSIS — Z79899 Other long term (current) drug therapy: Secondary | ICD-10-CM | POA: Insufficient documentation

## 2017-07-20 DIAGNOSIS — E119 Type 2 diabetes mellitus without complications: Secondary | ICD-10-CM | POA: Insufficient documentation

## 2017-07-20 NOTE — ED Triage Notes (Signed)
Pt endorses urinary frequency x 3 weeks. Denies vaginal d/c, bleeding, fever or chills but states "I have a burning sensation after intercourse" Tachy in triage and has hx of same.

## 2017-07-21 ENCOUNTER — Emergency Department (HOSPITAL_COMMUNITY)
Admission: EM | Admit: 2017-07-21 | Discharge: 2017-07-21 | Disposition: A | Discharge status: Home / Self Care | Payer: Medicaid Other | Attending: Emergency Medicine | Admitting: Emergency Medicine

## 2017-07-21 ENCOUNTER — Encounter (HOSPITAL_COMMUNITY): Payer: Self-pay | Admitting: Emergency Medicine

## 2017-07-21 DIAGNOSIS — R45851 Suicidal ideations: Secondary | ICD-10-CM | POA: Insufficient documentation

## 2017-07-21 DIAGNOSIS — F3132 Bipolar disorder, current episode depressed, moderate: Secondary | ICD-10-CM

## 2017-07-21 DIAGNOSIS — Z7984 Long term (current) use of oral hypoglycemic drugs: Secondary | ICD-10-CM

## 2017-07-21 DIAGNOSIS — Z79899 Other long term (current) drug therapy: Secondary | ICD-10-CM | POA: Insufficient documentation

## 2017-07-21 DIAGNOSIS — Z87891 Personal history of nicotine dependence: Secondary | ICD-10-CM | POA: Insufficient documentation

## 2017-07-21 DIAGNOSIS — E119 Type 2 diabetes mellitus without complications: Secondary | ICD-10-CM

## 2017-07-21 DIAGNOSIS — I10 Essential (primary) hypertension: Secondary | ICD-10-CM

## 2017-07-21 LAB — I-STAT BETA HCG BLOOD, ED (MC, WL, AP ONLY): I-stat hCG, quantitative: <5 m[IU]/mL (ref <5)

## 2017-07-21 LAB — COMPREHENSIVE METABOLIC PANEL
ALBUMIN: 4.2 g/dL (ref 3.5–5.0)
ALK PHOS: 47 U/L (ref 38–126)
ALT: 30 U/L (ref 14–54)
AST: 38 U/L (ref 15–41)
Anion gap: 12 (ref 5–15)
BUN: 7 mg/dL (ref 6–20)
CALCIUM: 9.8 mg/dL (ref 8.9–10.3)
CHLORIDE: 103 mmol/L (ref 101–111)
CO2: 23 mmol/L (ref 22–32)
CREATININE: 0.77 mg/dL (ref 0.44–1.00)
GFR calc non Af Amer: >60 mL/min (ref >60)
GLUCOSE: 291 mg/dL — AB (ref 65–99)
Potassium: 3.3 mmol/L — ABNORMAL LOW (ref 3.5–5.1)
Sodium: 138 mmol/L (ref 135–145)
Total Bilirubin: 0.4 mg/dL (ref 0.3–1.2)
Total Protein: 7.5 g/dL (ref 6.5–8.1)

## 2017-07-21 LAB — CBC
HEMATOCRIT: 40 % (ref 36.0–46.0)
HEMOGLOBIN: 13.5 g/dL (ref 12.0–15.0)
MCH: 27.5 pg (ref 26.0–34.0)
MCHC: 33.8 g/dL (ref 30.0–36.0)
MCV: 81.5 fL (ref 78.0–100.0)
Platelets: 234 10*3/uL (ref 150–400)
RBC: 4.91 MIL/uL (ref 3.87–5.11)
RDW: 14.7 % (ref 11.5–15.5)
WBC: 9.4 10*3/uL (ref 4.0–10.5)

## 2017-07-21 LAB — RAPID URINE DRUG SCREEN, HOSP PERFORMED
AMPHETAMINES: NOT DETECTED
BARBITURATES: NOT DETECTED
Benzodiazepines: NOT DETECTED
COCAINE: NOT DETECTED
Opiates: NOT DETECTED
TETRAHYDROCANNABINOL: NOT DETECTED

## 2017-07-21 LAB — ACETAMINOPHEN LEVEL: Acetaminophen (Tylenol), Serum: <10 ug/mL — ABNORMAL LOW (ref 10–30)

## 2017-07-21 LAB — SALICYLATE LEVEL

## 2017-07-21 LAB — ETHANOL: Alcohol, Ethyl (B): <10 mg/dL (ref <10)

## 2017-07-21 NOTE — ED Notes (Signed)
Pt called for room with no answer x 2.

## 2017-07-21 NOTE — ED Notes (Signed)
No answer in waiting room 

## 2017-07-21 NOTE — ED Triage Notes (Signed)
Reports being suicidal and having a lot of depression.

## 2017-07-22 ENCOUNTER — Emergency Department (HOSPITAL_COMMUNITY)
Admission: EM | Admit: 2017-07-22 | Discharge: 2017-07-22 | Disposition: A | Discharge status: Home / Self Care | Payer: Medicaid Other | Source: Home / Self Care | Attending: Emergency Medicine | Admitting: Emergency Medicine

## 2017-07-22 DIAGNOSIS — F339 Major depressive disorder, recurrent, unspecified: Secondary | ICD-10-CM

## 2017-07-22 NOTE — ED Provider Notes (Signed)
[facility] EMERGENCY DEPARTMENT Provider Note   CSN: [id number] Arrival date & time: 07/21/17  1955     History   Chief Complaint Chief Complaint  Patient presents with  . Suicidal    HPI [name] is a 27 y.o. female.  The history is provided by the patient and medical records.     27 year old female with history of anxiety, bipolar disorder, cognitive deficits, depression, diabetes, hypertension, obesity, presenting to the ED for suicidal ideation.  Patient states she actually just got out of behavioral health 4 days ago.  States while she was in there and they changed some of her medicine, specifically they took her off Cymbalta and started her on Depakote.  States prior to this she was on Cymbalta for several years and feels it is "messing with her system".  States she does not like the Depakote, feels like it is making her gain weight.  States she has just been increasingly depressed and does not know what to do.  She has had thoughts of hurting herself in the past few days but cannot give specific plan.  She denies any suicidal ideation at present.  No homicidal ideation.  No hallucinations.  Denies any illicit drug or alcohol abuse.  She reports compliance with her medications.  Past Medical History:  Diagnosis Date  . Anxiety   . Bipolar 1 disorder ([place])   . Cognitive deficits   . Depression   . Diabetes mellitus without complication ([place])   . Hypertension   . Mental disorder   . Obesity     Patient Active Problem List   Diagnosis Date Noted  . LGSIL on Pap smear of cervix 02/01/2017  . OCD (obsessive compulsive disorder) 10/05/2016  . Major depressive disorder, recurrent episode, mild ([place]) 05/04/2016  . 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 Learning disability 07/18/2015  . 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 Diabetes mellitus ([place]) 07/18/2015  . MDD (major depressive disorder), recurrent, severe, with psychosis ([place])  07/18/2015  . Hyperlipidemia 07/18/2015  . Severe episode of recurrent major depressive disorder, without psychotic features ([place])   . Drug overdose   . Cognitive deficits 10/12/2012  . Generalized anxiety disorder 06/28/2012    Past Surgical History:  Procedure Laterality Date  . CESAREAN SECTION    . CESAREAN SECTION N/A 04/25/2013   Procedure: REPEAT CESAREAN SECTION;  Surgeon: [name], MD;  Location: [facility];  Service: Obstetrics;  Laterality: N/A;  . MASS EXCISION N/A 06/03/2012   Procedure: EXCISION MASS;  Surgeon: [name], MD;  Location: [facility];  Service: ENT;  Laterality: N/A;  Excision uvula mass  . TONSILLECTOMY N/A 06/03/2012   Procedure: TONSILLECTOMY;  Surgeon: [name], MD;  Location: [facility];  Service: ENT;  Laterality: N/A;  . TONSILLECTOMY       OB History    Gravida  3   Para  3   Term  3   Preterm  0   AB  0   Living  3     SAB  0   TAB  0   Ectopic  0   Multiple  0   Live Births  3            Home Medications    Prior to Admission medications   Medication Sig Start Date End Date Taking? Authorizing Provider  benzonatate (TESSALON) 100 MG capsule Take 1 capsule (100 mg total) by mouth every 8 (eight) hours. Patient  taking differently: Take 100 mg by mouth 3 (three) times daily as needed for cough.  07/02/17   Kirichenko, Tatyana, PA-C  busPIRone (BUSPAR) 15 MG tablet Take 1 tablet (15 mg total) by mouth 2 (two) times daily. For anxiety 10/06/16   Lindell Spar I, NP  DULoxetine (CYMBALTA) 60 MG capsule Take 60 mg daily by mouth.    [provider]  fluvoxaMINE (LUVOX) 50 MG tablet Take 100 mg by mouth at bedtime.  02/02/17   [provider]  glipiZIDE (GLUCOTROL XL) 10 MG 24 hr tablet Take 1 tablet (10 mg total) by mouth every evening. For diabetes management 10/06/16   Lindell Spar I, NP  hydrochlorothiazide (HYDRODIURIL) 25 MG tablet Take 1 TABLET BY MOUTH EVERY DAY for  30 days 02/15/17   [provider]  hydrOXYzine (VISTARIL) 25 MG capsule Take 25 mg 3 (three) times daily as needed by mouth. "to shave my hair" 02/02/17   [provider]  LEVEMIR FLEXTOUCH 100 UNIT/ML Pen Inject 30 units at bedtime 02/10/17   [provider]  metFORMIN (GLUCOPHAGE) 500 MG tablet Take 1 tablet (500 mg total) by mouth 2 (two) times daily with a meal. For diabetes management 10/06/16   Lindell Spar I, NP  omeprazole (PRILOSEC) 20 MG capsule Take 1 capsule (20 mg total) by mouth daily. For acid reflux 10/06/16   Lindell Spar I, NP  paliperidone (INVEGA SUSTENNA) 234 MG/1.5ML SUSP injection Inject 234 mg every 30 (thirty) days into the muscle.    [provider]    Family History Family History  Problem Relation Age of Onset  . Hypertension Mother   . Diabetes Father     Social History Social History   Tobacco Use  . Smoking status: Former Smoker    Packs/day: 1.00    Years: 7.00    Pack years: 7.00    Types: Cigarettes    Last attempt to quit: 02/14/2017    Years since quitting: 0.4  . Smokeless tobacco: Never Used  Substance Use Topics  . Alcohol use: No  . Drug use: No    Comment: Patient denies     Allergies   Wellbutrin [bupropion]; Omnipaque [iohexol]; and Penicillins   Review of Systems Review of Systems  Psychiatric/Behavioral: Positive for suicidal ideas.  All other systems reviewed and are negative.    Physical Exam Updated Vital Signs BP (!) 134/93 (BP Location: Right Arm)   Pulse 100   Temp 98.3 F (36.8 C) (Oral)   Resp 16   Ht 5\' 7"  (1.702 m)   Wt 115.2 kg (254 lb)   SpO2 100%   BMI 39.78 kg/m   Physical Exam  Constitutional: She is oriented to person, place, and time. She appears well-developed and well-nourished.  HENT:  Head: Normocephalic and atraumatic.  Mouth/Throat: Oropharynx is clear and moist.  Eyes: Pupils are equal, round, and reactive to light. Conjunctivae and EOM are normal.    Neck: Normal range of motion.  Cardiovascular: Normal rate, regular rhythm and normal heart sounds.  Pulmonary/Chest: Effort normal and breath sounds normal.  Abdominal: Soft. Bowel sounds are normal.  Musculoskeletal: Normal range of motion.  Neurological: She is alert and oriented to person, place, and time.  Skin: Skin is warm and dry.  Psychiatric: She is not actively hallucinating. She expresses suicidal ideation. She expresses no homicidal ideation. She expresses no suicidal plans and no homicidal plans.  Odd affect  Nursing note and vitals reviewed.    ED Treatments /  Results  Labs (all labs ordered are listed, but only abnormal results are displayed) Labs Reviewed  COMPREHENSIVE METABOLIC PANEL - Abnormal; Notable for the following components:      Result Value   Potassium 3.3 (*)    Glucose, Bld 291 (*)    All other components within normal limits  ACETAMINOPHEN LEVEL - Abnormal; Notable for the following components:   Acetaminophen (Tylenol), Serum <10 (*)    All other components within normal limits  ETHANOL  SALICYLATE LEVEL  CBC  RAPID URINE DRUG SCREEN, HOSP PERFORMED  I-STAT BETA HCG BLOOD, ED ([facility], [facility], [facility] ONLY)    EKG None  Radiology No results found.  Procedures Procedures (including critical care time)  Medications Ordered in ED Medications - No data to display   Initial Impression / Assessment and Plan / ED Course  I have reviewed the triage vital signs and the nursing notes.  Pertinent labs & imaging results that were available during my care of the patient were reviewed by me and considered in my medical decision making (see chart for details).  27 year old female here with suicidal ideation.  States she is felt like harming herself of the past few days but does not give any specific plan.  She denies any suicidal ideation during my evaluation.  Recent admission to behavioral health, discharged 4 days ago.  Did have some medication changes.  She  is in no acute distress here.  Vitals are stable.  Screening labs overall reassuring.  Patient medically cleared.  We will get TTS consultation.  3:48 AM Spoke with TTS, Stanton Kidney-- patient does not meet IP criteria.  Plan to d/c home to follow-up with her OP team.  Feel this is appropriate.  Patient understands need and importance of outpatient follow-up, medication compliance.  She understands to return here for any new or worsening symptoms.  Final Clinical Impressions(s) / ED Diagnoses   Final diagnoses:  Episode of recurrent major depressive disorder, unspecified depression episode severity [facility])    ED Discharge Orders    None       Larene Pickett, PA-C 07/22/17 0617    Palumbo, April, MD 07/22/17 [phone]

## 2017-07-22 NOTE — BH Assessment (Addendum)
Tele Assessment Note   Patient Name: [name] MRN: [id number] Referring Physician: PA LISA SANDERS Location of Patient: [place] of Provider: [place] is an 27 y.o. female who presented to [facility] voluntarily tonight c/o depression. Pt denies SI, HI, SHI and AVH. Pt sts "I think I just need to follow up with my doctor." Pt reports she sees [facility] for medication management and therapy. Pt also has [facility] Conservation officer, nature for assistance. Pt reports four prior suicide attempts by overdose. Per chart review, pt has been admitted to [facility] inpatient unit as a child and as an adult. Her most recent admission to [facility] was June 2018. Pt reports most recent psych admission was Oct 2018 at [facility]. Pt has a history of Bipolar disorder, OCD and suicidal ideation. She reports she is compliant with her psych meds she received from [facility]. Per [facility] team member, Maudie Mercury, on 07/08/17, meds may not be working effectively. Pt currently denies any auditory or visual hallucinations. Pt denies homicidal thoughts or physical aggression. Pt denies having access to firearms. Pt denies having any legal problems at this time. Pt denies any current or past substance abuse problems. Pt does not appear to be intoxicated or in withdrawal at this time.  Pt sts she now lives alone in her own apartment and has emotional support from her mother and sister. She reports she has biological daughters who "are in adoption" (ages 36, 65 and 50) Pt sts she si unemployed and receiving disability income. Pt acknowledges symptoms including: sadness, fatigue, low self esteem, negative outlook, difficulty concentrating, helplessness, sleeping less, eating less.  Pt also reports feelings of anxiety and occasional nightmares. Pt states current stressors includes that she thinks she has ADHD, cannot be still and that she keeps shaving her head and cannot stop. Pt denies history of abuse and trauma. Pt  reports there is a family history of MH. Pt has poor insight and impaired judgment. Pt's memory is intact.  Pt denies legal history. Pt denies alcohol/ substance abuse and testing in the ED confirms no alcohol or drugs tonight.  Pt was dressed in scrubs and sitting on her hospital bed. Pt was alert, cooperative and polite. Pt kept good eye contact, spoke in a clear tone and at a normal pace. Pt moved in a normal manner when moving. Pt's thought process was coherent and relevant and judgement was impaired.  No indication of delusional thinking or response to internal stimuli. Pt's mood was stated as depressed but not anxious and her blunted affect was congruent.  Pt was oriented x 4, to person, place, time and situation.   Diagnosis: F31.32 Bipolar 1 D/O, Current Episode depressed, Moderate, by hx  Past Medical History:  Past Medical History:  Diagnosis Date  . Anxiety   . Bipolar 1 disorder ([place])   . Cognitive deficits   . Depression   . Diabetes mellitus without complication ([place])   . Hypertension   . Mental disorder   . Obesity     Past Surgical History:  Procedure Laterality Date  . CESAREAN SECTION    . CESAREAN SECTION N/A 04/25/2013   Procedure: REPEAT CESAREAN SECTION;  Surgeon: [name], MD;  Location: [facility];  Service: Obstetrics;  Laterality: N/A;  . MASS EXCISION N/A 06/03/2012   Procedure: EXCISION MASS;  Surgeon: [name], MD;  Location: [facility];  Service: ENT;  Laterality: N/A;  Excision uvula mass  . TONSILLECTOMY N/A  06/03/2012   Procedure: TONSILLECTOMY;  Surgeon: [name], MD;  Location: [facility];  Service: ENT;  Laterality: N/A;  . TONSILLECTOMY      Family History:  Family History  Problem Relation Age of Onset  . Hypertension Mother   . Diabetes Father     Social History:  reports that she quit smoking about 5 months ago. Her smoking use included cigarettes. She has a 7.00 pack-year smoking history. She has  never used smokeless tobacco. She reports that she does not drink alcohol or use drugs.  Additional Social History:  Alcohol / Drug Use Prescriptions: SEE MAR History of alcohol / drug use?: No history of alcohol / drug abuse  CIWA: CIWA-Ar BP: (!) 134/93 Pulse Rate: 100 COWS:    Allergies:  Allergies  Allergen Reactions  . Wellbutrin [Bupropion] Shortness Of Breath  . Omnipaque [Iohexol] Swelling and Other (See Comments)    Reaction:  Eye swelling  . Penicillins Hives and Other (See Comments)    Has patient had a PCN reaction causing immediate rash, facial/tongue/throat swelling, SOB or lightheadedness with hypotension: No Has patient had a PCN reaction causing severe rash involving mucus membranes or skin necrosis: No Has patient had a PCN reaction that required hospitalization No Has patient had a PCN reaction occurring within the last 10 years: No If all of the above answers are "NO", then may proceed with Cephalosporin use.    Home Medications:  (Not in a hospital admission)  OB/GYN Status:  No LMP recorded. Patient has had an implant.  General Assessment Data Location of Assessment: [facility] ED TTS Assessment: In system Is this a Tele or Face-to-Face Assessment?: Tele Assessment Is this an Initial Assessment or a Re-assessment for this encounter?: Initial Assessment Marital status: Single Maiden name: Seckel Is patient pregnant?: Unknown Pregnancy Status: Unknown Living Arrangements: Alone Can pt return to current living arrangement?: Yes Admission Status: Voluntary Is patient capable of signing voluntary admission?: Yes Referral Source: Self/Family/Friend Insurance type: MEDICAID     Crisis Care Plan Living Arrangements: Alone Name of Psychiatrist: [facility] Name of Therapist: [facility](ALSO, CST THROUGH [facility])  Education Status Is patient currently in school?: No Highest grade of school patient has completed: 11 Is the patient employed, unemployed or receiving  disability?: Receiving disability income  Risk to self with the past 6 months Suicidal Ideation: No Has patient been a risk to self within the past 6 months prior to admission? : Yes Suicidal Intent: No Has patient had any suicidal intent within the past 6 months prior to admission? : Yes Is patient at risk for suicide?: No Suicidal Plan?: No Access to Means: No(DENIES ACCCESS TO GUNS) What has been your use of drugs/alcohol within the last 12 months?: NONE Previous Attempts/Gestures: Yes How many times?: 4 Other Self Harm Risks: NONE REPORTED Triggers for Past Attempts: Unpredictable Intentional Self Injurious Behavior: None Family Suicide History: No Recent stressful life event(s): (NO SPECIFIC STRESSORS REPORTED) Persecutory voices/beliefs?: No Depression: Yes Depression Symptoms: Despondent, Insomnia, Tearfulness, Isolating, Fatigue, Feeling worthless/self pity Substance abuse history and/or treatment for substance abuse?: No Suicide prevention information given to non-admitted patients: Not applicable  Risk to Others within the past 6 months Homicidal Ideation: No Does patient have any lifetime risk of violence toward others beyond the six months prior to admission? : No Thoughts of Harm to Others: No Current Homicidal Intent: No Current Homicidal Plan: No Access to Homicidal Means: No Identified Victim: NONE REPORTED History of harm to others?: No Assessment  of Violence: None Noted Violent Behavior Description: NA Does patient have access to weapons?: No Criminal Charges Pending?: No Does patient have a court date: No Is patient on probation?: No  Psychosis Hallucinations: None noted Delusions: None noted  Mental Status Report Appearance/Hygiene: In scrubs, Unremarkable Eye Contact: Good Motor Activity: Freedom of movement Speech: Logical/coherent Level of Consciousness: Alert Mood: Depressed, Pleasant Affect: Depressed, Blunted Anxiety Level:  Minimal Thought Processes: Coherent, Relevant Judgement: Impaired Orientation: Person, Place, Time, Situation, Appropriate for developmental age Obsessive Compulsive Thoughts/Behaviors: None  Cognitive Functioning Concentration: Normal Memory: Recent Intact, Remote Intact Is patient IDD: Yes(PER HX) Level of Function: BELOW AVG Is patient DD?: No I IQ score available?: No Insight: Poor Impulse Control: Poor Appetite: Good Have you had any weight changes? : No Change Amount of the weight change? (lbs): 0 lbs Sleep: No Change Total Hours of Sleep: 4 Vegetative Symptoms: None  ADLScreening [facility] Assessment Services) Patient's cognitive ability adequate to safely complete daily activities?: Yes Patient able to express need for assistance with ADLs?: Yes Independently performs ADLs?: Yes (appropriate for developmental age)(NO BARRIERS REPORTED)  Prior Inpatient Therapy Prior Inpatient Therapy: Yes Prior Therapy Dates: DATING BACK TO 2006 TO 2018 Prior Therapy Facilty/Provider(s): [facility], [facility] Reason for Treatment: BIPOLAR, SI  Prior Outpatient Therapy Prior Outpatient Therapy: Yes Prior Therapy Dates: CURRENT Prior Therapy Facilty/Provider(s): [facility](ALSO [facility] CST TEAM) Reason for Treatment: MED MGMT & OPT; COMMUNITY SUPPORT ([facility]) Does patient have an ACCT team?: No(CST) Does patient have Intensive In-House Services?  : No Does patient have [facility] services? : Yes Does patient have P4CC services?: No  ADL Screening (condition at time of admission) Patient's cognitive ability adequate to safely complete daily activities?: Yes Patient able to express need for assistance with ADLs?: Yes Independently performs ADLs?: Yes (appropriate for developmental age)(NO BARRIERS REPORTED)       Abuse/Neglect Assessment (Assessment to be complete while patient is alone) Physical Abuse: Denies Verbal Abuse: Denies Sexual Abuse: Denies Exploitation of patient/patient's resources:  Denies Self-Neglect: Denies     Regulatory affairs officer (For Healthcare) Does Patient Have a Medical Advance Directive?: No Would patient like information on creating a medical advance directive?: No - Patient declined          Disposition:  Disposition Initial Assessment Completed for this Encounter: Yes Patient referred to: Other (Comment)  This service was provided via telemedicine using a 2-way, interactive audio and video technology.  Names of all persons participating in this telemedicine service and their role in this encounter. Name: Faylene Kurtz, MS, [facility], [facility] Role: Triage Specialist  Name: [name] Role: Patient  Name:  Role:   Name:  Role:    Consulted with [name] PA. Pt does not meet IP criteria. Pt is psychiatrically cleared. Recommend discharge to current providers.   Spoke to Quincy Carnes, PA, at [facility] and advised of recommendation.  Faylene Kurtz, MS, CRC, [place] Triage Specialist [facility] T 07/22/2017 3:16 AM

## 2017-07-22 NOTE — Discharge Instructions (Signed)
Continue your medications. Follow-up with your psychiatry team. Can also follow-up with your primary care doctor. Return here for any new/acute changes.

## 2017-07-23 ENCOUNTER — Emergency Department (HOSPITAL_COMMUNITY)
Admission: EM | Admit: 2017-07-23 | Discharge: 2017-07-23 | Discharge status: Against Medical Advice | Payer: Medicaid Other | Source: Home / Self Care

## 2017-07-26 ENCOUNTER — Encounter (HOSPITAL_COMMUNITY): Payer: Self-pay | Admitting: Emergency Medicine

## 2017-07-26 ENCOUNTER — Emergency Department (HOSPITAL_COMMUNITY)
Admission: EM | Admit: 2017-07-26 | Discharge: 2017-07-26 | Disposition: A | Discharge status: Home / Self Care | Payer: Medicaid Other | Attending: Emergency Medicine | Admitting: Emergency Medicine

## 2017-07-26 DIAGNOSIS — N939 Abnormal uterine and vaginal bleeding, unspecified: Secondary | ICD-10-CM | POA: Diagnosis not present

## 2017-07-26 DIAGNOSIS — Z5321 Procedure and treatment not carried out due to patient leaving prior to being seen by health care provider: Secondary | ICD-10-CM | POA: Insufficient documentation

## 2017-07-26 NOTE — ED Triage Notes (Signed)
Patient c/o vaginal bleeding with intercourse x3 years. Denies pain.

## 2017-07-26 NOTE — ED Notes (Signed)
Called Pt in lobby for vital recheck, no response in lobby x1. 

## 2017-07-26 NOTE — ED Notes (Signed)
Patient called for room placement x1 with no answer. 

## 2017-07-26 NOTE — ED Notes (Signed)
Called Pt in lobby to be roomed x2, no response in lobby from Pt x3, do no see Pt in lobby.

## 2017-07-28 ENCOUNTER — Emergency Department (HOSPITAL_COMMUNITY)
Admission: EM | Admit: 2017-07-28 | Discharge: 2017-07-28 | Discharge status: Against Medical Advice | Payer: Medicaid Other | Attending: Emergency Medicine | Admitting: Emergency Medicine

## 2017-07-28 ENCOUNTER — Encounter (HOSPITAL_COMMUNITY): Payer: Self-pay | Admitting: Emergency Medicine

## 2017-07-28 DIAGNOSIS — Z5321 Procedure and treatment not carried out due to patient leaving prior to being seen by health care provider: Secondary | ICD-10-CM | POA: Insufficient documentation

## 2017-07-28 DIAGNOSIS — R5383 Other fatigue: Secondary | ICD-10-CM | POA: Diagnosis present

## 2017-07-28 NOTE — ED Triage Notes (Signed)
Pt reports fatigue for over 2 weeks. Reports body aches since yesterday. Denies n/v/d.

## 2017-07-29 ENCOUNTER — Emergency Department (HOSPITAL_COMMUNITY)
Admission: EM | Admit: 2017-07-29 | Discharge: 2017-07-30 | Disposition: A | Discharge status: Home / Self Care | Payer: Medicaid Other | Attending: Emergency Medicine | Admitting: Emergency Medicine

## 2017-07-29 ENCOUNTER — Encounter (HOSPITAL_COMMUNITY): Payer: Self-pay

## 2017-07-29 ENCOUNTER — Other Ambulatory Visit: Payer: Self-pay

## 2017-07-29 DIAGNOSIS — Z79899 Other long term (current) drug therapy: Secondary | ICD-10-CM | POA: Diagnosis not present

## 2017-07-29 DIAGNOSIS — F17210 Nicotine dependence, cigarettes, uncomplicated: Secondary | ICD-10-CM | POA: Insufficient documentation

## 2017-07-29 DIAGNOSIS — R45851 Suicidal ideations: Secondary | ICD-10-CM | POA: Diagnosis not present

## 2017-07-29 DIAGNOSIS — E119 Type 2 diabetes mellitus without complications: Secondary | ICD-10-CM | POA: Diagnosis not present

## 2017-07-29 DIAGNOSIS — F329 Major depressive disorder, single episode, unspecified: Secondary | ICD-10-CM | POA: Diagnosis present

## 2017-07-29 DIAGNOSIS — F3132 Bipolar disorder, current episode depressed, moderate: Secondary | ICD-10-CM | POA: Insufficient documentation

## 2017-07-29 DIAGNOSIS — I10 Essential (primary) hypertension: Secondary | ICD-10-CM | POA: Insufficient documentation

## 2017-07-29 LAB — COMPREHENSIVE METABOLIC PANEL
ALBUMIN: 4.3 g/dL (ref 3.5–5.0)
ALK PHOS: 45 U/L (ref 38–126)
ALT: 29 U/L (ref 14–54)
AST: 32 U/L (ref 15–41)
Anion gap: 12 (ref 5–15)
BILIRUBIN TOTAL: 0.6 mg/dL (ref 0.3–1.2)
BUN: 14 mg/dL (ref 6–20)
CALCIUM: 9.2 mg/dL (ref 8.9–10.3)
CO2: 26 mmol/L (ref 22–32)
CREATININE: 0.77 mg/dL (ref 0.44–1.00)
Chloride: 99 mmol/L — ABNORMAL LOW (ref 101–111)
GFR calc Af Amer: >60 mL/min (ref >60)
GFR calc non Af Amer: >60 mL/min (ref >60)
GLUCOSE: 161 mg/dL — AB (ref 65–99)
Potassium: 3 mmol/L — ABNORMAL LOW (ref 3.5–5.1)
SODIUM: 137 mmol/L (ref 135–145)
Total Protein: 7.6 g/dL (ref 6.5–8.1)

## 2017-07-29 LAB — CBC
HEMATOCRIT: 40.3 % (ref 36.0–46.0)
HEMOGLOBIN: 13.3 g/dL (ref 12.0–15.0)
MCH: 27 pg (ref 26.0–34.0)
MCHC: 33 g/dL (ref 30.0–36.0)
MCV: 81.9 fL (ref 78.0–100.0)
Platelets: 226 10*3/uL (ref 150–400)
RBC: 4.92 MIL/uL (ref 3.87–5.11)
RDW: 14.4 % (ref 11.5–15.5)
WBC: 9.6 10*3/uL (ref 4.0–10.5)

## 2017-07-29 LAB — RAPID URINE DRUG SCREEN, HOSP PERFORMED
Amphetamines: NOT DETECTED
BARBITURATES: NOT DETECTED
Benzodiazepines: NOT DETECTED
Cocaine: NOT DETECTED
Opiates: NOT DETECTED
TETRAHYDROCANNABINOL: NOT DETECTED

## 2017-07-29 LAB — SALICYLATE LEVEL: Salicylate Lvl: <7 mg/dL (ref 2.8–30.0)

## 2017-07-29 LAB — I-STAT BETA HCG BLOOD, ED (MC, WL, AP ONLY)

## 2017-07-29 LAB — ETHANOL: Alcohol, Ethyl (B): <10 mg/dL (ref <10)

## 2017-07-29 LAB — ACETAMINOPHEN LEVEL: Acetaminophen (Tylenol), Serum: <10 ug/mL — ABNORMAL LOW (ref 10–30)

## 2017-07-29 LAB — CBG MONITORING, ED: GLUCOSE-CAPILLARY: 128 mg/dL — AB (ref 65–99)

## 2017-07-29 MED ORDER — ONDANSETRON HCL 4 MG PO TABS: 4.0000 mg | ORAL_TABLET | Freq: Three times a day (TID) | ORAL | Status: DC | PRN

## 2017-07-29 MED ORDER — ACETAMINOPHEN 325 MG PO TABS
650.0000 mg | ORAL_TABLET | ORAL | Status: DC | PRN
  Filled 2017-07-29: qty 2

## 2017-07-29 MED ORDER — POTASSIUM CHLORIDE CRYS ER 20 MEQ PO TBCR
40.0000 meq | EXTENDED_RELEASE_TABLET | Freq: Once | ORAL | Status: AC
  Administered 2017-07-30: 40 meq via ORAL
  Filled 2017-07-29: qty 2

## 2017-07-29 NOTE — ED Triage Notes (Addendum)
Pt presents with 1 week h/o suicidal ideation of overdosing on her medication.  Pt reports she met a man about a week ago and has had SI since then.  Pt denies any AH or VH, denies homicidal ideations.  Pt called crisis (Psico-therapy) ot line and caretaker: Minna Antis [phone] if needed. Pt also reports 1 week of vaginal bleeding, reports she begins to have vaginal bleeding after intercourse, reports bleeding is not heavy, denies any abdominal pain.

## 2017-07-29 NOTE — ED Provider Notes (Signed)
[facility] EMERGENCY DEPARTMENT Provider Note   CSN: [id number] Arrival date & time: 07/29/17  1520     History   Chief Complaint No chief complaint on file.   HPI [name] is a 27 y.o. female with past medical history of depression, bipolar 1 disorder, hypertension, anxiety, diabetes, presenting to the ED voluntarily for suicidal ideation. Pt states she began feeling suicidal today, with plan to overdose on all of her prescribed medications. States she has been having more issues with depression since switching form cymbalta to depakote. Denies HI, AVH, questions, drug or alcohol use.  She denies any medical complaints today. Ports she receives psychiatric care at [facility].  The history is provided by the patient.    Past Medical History:  Diagnosis Date  . Anxiety   . Bipolar 1 disorder ([place])   . Cognitive deficits   . Depression   . Diabetes mellitus without complication ([place])   . Hypertension   . Mental disorder   . Obesity     Patient Active Problem List   Diagnosis Date Noted  . LGSIL on Pap smear of cervix 02/01/2017  . OCD (obsessive compulsive disorder) 10/05/2016  . Major depressive disorder, recurrent episode, mild ([place]) 05/04/2016  . 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 Learning disability 07/18/2015  . 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 Diabetes mellitus ([place]) 07/18/2015  . MDD (major depressive disorder), recurrent, severe, with psychosis ([place]) 07/18/2015  . Hyperlipidemia 07/18/2015  . Severe episode of recurrent major depressive disorder, without psychotic features ([place])   . Drug overdose   . Cognitive deficits 10/12/2012  . Generalized anxiety disorder 06/28/2012    Past Surgical History:  Procedure Laterality Date  . CESAREAN SECTION    . CESAREAN SECTION N/A 04/25/2013   Procedure: REPEAT CESAREAN SECTION;  Surgeon: [name], MD;  Location: [facility];  Service: Obstetrics;  Laterality: N/A;  . MASS  EXCISION N/A 06/03/2012   Procedure: EXCISION MASS;  Surgeon: [name], MD;  Location: [facility];  Service: ENT;  Laterality: N/A;  Excision uvula mass  . TONSILLECTOMY N/A 06/03/2012   Procedure: TONSILLECTOMY;  Surgeon: [name], MD;  Location: [facility];  Service: ENT;  Laterality: N/A;  . TONSILLECTOMY       OB History    Gravida  3   Para  3   Term  3   Preterm  0   AB  0   Living  3     SAB  0   TAB  0   Ectopic  0   Multiple  0   Live Births  3            Home Medications    Prior to Admission medications   Medication Sig Start Date End Date Taking? Authorizing Provider  busPIRone (BUSPAR) 15 MG tablet Take 1 tablet (15 mg total) by mouth 2 (two) times daily. For anxiety 10/06/16   Lindell Spar I, NP  DULoxetine (CYMBALTA) 60 MG capsule Take 60 mg daily by mouth.    [provider]  fluvoxaMINE (LUVOX) 50 MG tablet Take 100 mg by mouth at bedtime.  02/02/17   [provider]  glipiZIDE (GLUCOTROL XL) 10 MG 24 hr tablet Take 1 tablet (10 mg total) by mouth every evening. For diabetes management 10/06/16   Lindell Spar I, NP  hydrochlorothiazide (HYDRODIURIL) 25 MG tablet Take 1 TABLET BY MOUTH EVERY DAY for 30 days 02/15/17   [provider]  hydrOXYzine (VISTARIL) 25 MG capsule Take 25 mg 3 (three) times daily as needed by mouth. "to shave my hair" 02/02/17   [provider]  LEVEMIR FLEXTOUCH 100 UNIT/ML Pen Inject 30 units at bedtime 02/10/17   [provider]  metFORMIN (GLUCOPHAGE) 500 MG tablet Take 1 tablet (500 mg total) by mouth 2 (two) times daily with a meal. For diabetes management 10/06/16   Lindell Spar I, NP  omeprazole (PRILOSEC) 20 MG capsule Take 1 capsule (20 mg total) by mouth daily. For acid reflux 10/06/16   Lindell Spar I, NP  paliperidone (INVEGA SUSTENNA) 234 MG/1.5ML SUSP injection Inject 234 mg every 30 (thirty) days into the muscle.    [provider]    Family History Family History  Problem Relation Age of Onset  . Hypertension Mother   . Diabetes Father     Social History Social History   Tobacco Use  . Smoking status: Current Every Day Smoker    Packs/day: 1.00    Years: 7.00    Pack years: 7.00    Types: Cigarettes  . Smokeless tobacco: Never Used  Substance Use Topics  . Alcohol use: No  . Drug use: No    Comment: Patient denies     Allergies   Wellbutrin [bupropion]; Omnipaque [iohexol]; and Penicillins   Review of Systems Review of Systems  Psychiatric/Behavioral: Positive for dysphoric mood and suicidal ideas. Negative for hallucinations and self-injury.  All other systems reviewed and are negative.    Physical Exam Updated Vital Signs BP (!) 147/102   Pulse 100   Temp 99 F (37.2 C) (Oral)   Resp 16   SpO2 99%   Physical Exam  Constitutional: She is oriented to person, place, and time. She appears well-developed and well-nourished. No distress.  HENT:  Head: Normocephalic and atraumatic.  Eyes: Pupils are equal, round, and reactive to light. Conjunctivae and EOM are normal.  Cardiovascular: Normal rate and intact distal pulses.  Pulmonary/Chest: Effort normal.  Abdominal: Soft.  Neurological: She is alert and oriented to person, place, and time.  Skin: Skin is warm.  Psychiatric: Her behavior is normal. She is not actively hallucinating. She expresses suicidal ideation. She expresses no homicidal ideation. She expresses suicidal plans. She expresses no homicidal plans.  Patient is calm and cooperative.  Flat affect.  Nursing note and vitals reviewed.    ED Treatments / Results  Labs (all labs ordered are listed, but only abnormal results are displayed) Labs Reviewed  COMPREHENSIVE METABOLIC PANEL - Abnormal; Notable for the following components:      Result Value   Potassium 3.0 (*)    Chloride 99 (*)    Glucose, Bld 161 (*)    All other components within normal  limits  ACETAMINOPHEN LEVEL - Abnormal; Notable for the following components:   Acetaminophen (Tylenol), Serum <10 (*)    All other components within normal limits  CBG MONITORING, ED - Abnormal; Notable for the following components:   Glucose-Capillary 128 (*)    All other components within normal limits  ETHANOL  SALICYLATE LEVEL  CBC  RAPID URINE DRUG SCREEN, HOSP PERFORMED  I-STAT BETA HCG BLOOD, ED ([facility], [facility], [facility] ONLY)    EKG None  Radiology No results found.  Procedures Procedures (including critical care time)  Medications Ordered in ED Medications  acetaminophen (TYLENOL) tablet 650 mg (has no administration in time range)  ondansetron (ZOFRAN) tablet 4 mg (has no administration in time range)  busPIRone (BUSPAR) tablet 15 mg (has no administration in time range)  DULoxetine (CYMBALTA) DR capsule 60 mg (has no administration in time range)  fluvoxaMINE (LUVOX) tablet 100 mg (has no administration in time range)  glipiZIDE (GLUCOTROL XL) 24 hr tablet 10 mg (has no administration in time range)  hydrochlorothiazide (HYDRODIURIL) tablet 25 mg (has no administration in time range)  hydrOXYzine (VISTARIL) capsule 25 mg (has no administration in time range)  Insulin Detemir (LEVEMIR) FlexPen 30 Units (has no administration in time range)  metFORMIN (GLUCOPHAGE) tablet 500 mg (has no administration in time range)  pantoprazole (PROTONIX) EC tablet 40 mg (has no administration in time range)  potassium chloride SA (K-DUR,KLOR-CON) CR tablet 40 mEq (40 mEq Oral Given 07/30/17 0053)     Initial Impression / Assessment and Plan / ED Course  I have reviewed the triage vital signs and the nursing notes.  Pertinent labs & imaging results that were available during my care of the patient were reviewed by me and considered in my medical decision making (see chart for details).     Patient presenting to the ED with suicidal ideation, with plan to overdose on her prescribed  medications.  Denies HI, auditory or visual hallucinations.  Denies any ingestions, alcohol or drug use.  No medical complaints today.  Labs with mild hypokalemia, otherwise unremarkable.  Will replace with p.o. potassium. Pt is medically cleared for TTS consult.  Final Clinical Impressions(s) / ED Diagnoses   Final diagnoses:  Suicidal ideation    ED Discharge Orders    None       Yahaira Bruski, [place] N, PA-C 07/30/17 0126    Malvin Johns, MD 07/30/17 [phone]

## 2017-07-30 LAB — CBG MONITORING, ED: Glucose-Capillary: 234 mg/dL — ABNORMAL HIGH (ref 65–99)

## 2017-07-30 MED ORDER — BUSPIRONE HCL 10 MG PO TABS
15.0000 mg | ORAL_TABLET | Freq: Two times a day (BID) | ORAL | Status: DC
  Administered 2017-07-30: 15 mg via ORAL
  Filled 2017-07-30: qty 2

## 2017-07-30 MED ORDER — GLIPIZIDE ER 10 MG PO TB24
10.0000 mg | ORAL_TABLET | Freq: Every evening | ORAL | Status: DC
  Filled 2017-07-30: qty 1

## 2017-07-30 MED ORDER — HYDROXYZINE HCL 25 MG PO TABS
25.0000 mg | ORAL_TABLET | Freq: Three times a day (TID) | ORAL | Status: DC | PRN
  Administered 2017-07-30: 25 mg via ORAL
  Filled 2017-07-30: qty 1

## 2017-07-30 MED ORDER — INSULIN DETEMIR 100 UNIT/ML ~~LOC~~ SOLN
30.0000 [IU] | Freq: Every day | SUBCUTANEOUS | Status: DC
  Filled 2017-07-30: qty 0.3

## 2017-07-30 MED ORDER — FLUVOXAMINE MALEATE 100 MG PO TABS
100.0000 mg | ORAL_TABLET | Freq: Every day | ORAL | Status: DC
  Filled 2017-07-30: qty 1

## 2017-07-30 MED ORDER — DULOXETINE HCL 60 MG PO CPEP
60.0000 mg | ORAL_CAPSULE | Freq: Every day | ORAL | Status: DC
  Administered 2017-07-30: 60 mg via ORAL
  Filled 2017-07-30 (×2): qty 1

## 2017-07-30 MED ORDER — METFORMIN HCL 500 MG PO TABS: 500.0000 mg | ORAL_TABLET | Freq: Two times a day (BID) | ORAL | Status: DC

## 2017-07-30 MED ORDER — HYDROCHLOROTHIAZIDE 25 MG PO TABS
25.0000 mg | ORAL_TABLET | Freq: Every day | ORAL | Status: DC
  Administered 2017-07-30: 25 mg via ORAL
  Filled 2017-07-30: qty 1

## 2017-07-30 MED ORDER — PANTOPRAZOLE SODIUM 40 MG PO TBEC: 40.0000 mg | DELAYED_RELEASE_TABLET | Freq: Every day | ORAL | Status: DC

## 2017-07-30 NOTE — BH Assessment (Addendum)
Tele Assessment Note   Patient Name: [name] MRN: [id number] Referring Physician: [place], [place].    Location of Patient: [facility] Location of Provider: [place] is an 27 y.o. female, who presents voluntary and unaccompanied to [facility]. Clinician asked the pt, "what brought you to the hospital?" Pt reported, "medicine not working." Pt reported, she can tell her medicine isn't working because she cries. Pt reported, she is prescribed Depakote three weeks ago but, she does not take it because it makes her weak and sick. Pt reported, she was already feeling bad but she began feeling worse. Pt reported, initially she was suicidal with a plan to overdose. Pt reported, she is no longer suicidal. Pt denies, SI, HI, AVH, self-injurious behaviors and access to weapons.   Pt denies abuse. Pt reported, smoking a pack of cigarettes daily. Pt's BAL was 128 at 1837. Pt is linked to [facility] for medication management and counseling. Pt reported, previous inpatient admissions at [facility], [place] and [facility].   Pt presents sleeping in scrubs with logical/coherent speech. Pt's eye contact was poor (pt's eyes were closed during the assessment.) Pt's mood was depressed. Pt's affect was flat. Pt's thought process was coherent/relevant. Pt was oriented x4. Pt's judgement was partial. Pt's concentration was normal. Pt's insight was poor. Pt's impulse control was fair. Pt reported, if discharged from [facility] she could contract for safety. Pt reported, if inpatient treatment was recommended he could contract for safety.    Diagnosis: F31.32 Bipolar I Disorder, current episode, depressed, moderate.  Past Medical History:  Past Medical History:  Diagnosis Date  . Anxiety   . Bipolar 1 disorder ([place])   . Cognitive deficits   . Depression   . Diabetes mellitus without complication ([place])   . Hypertension   . Mental disorder   . Obesity     Past Surgical History:   Procedure Laterality Date  . CESAREAN SECTION    . CESAREAN SECTION N/A 04/25/2013   Procedure: REPEAT CESAREAN SECTION;  Surgeon: [name], MD;  Location: [facility];  Service: Obstetrics;  Laterality: N/A;  . MASS EXCISION N/A 06/03/2012   Procedure: EXCISION MASS;  Surgeon: [name], MD;  Location: [facility];  Service: ENT;  Laterality: N/A;  Excision uvula mass  . TONSILLECTOMY N/A 06/03/2012   Procedure: TONSILLECTOMY;  Surgeon: [name], MD;  Location: [facility];  Service: ENT;  Laterality: N/A;  . TONSILLECTOMY      Family History:  Family History  Problem Relation Age of Onset  . Hypertension Mother   . Diabetes Father     Social History:  reports that she has been smoking cigarettes.  She has a 7.00 pack-year smoking history. She has never used smokeless tobacco. She reports that she does not drink alcohol or use drugs.  Additional Social History:  Alcohol / Drug Use Pain Medications: See MAR Prescriptions: See MAR Over the Counter: See MAR History of alcohol / drug use?: Yes Substance #1 Name of Substance 1: Alcohol 1 - Age of First Use: UTA 1 - Amount (size/oz): Pt's BAL was 128 at 1837. 1 - Frequency: UTA 1 - Duration: UTA 1 - Last Use / Amount: UTA Substance #2 Name of Substance 2: Cigarettes. 2 - Age of First Use: UTA 2 - Amount (size/oz): Pt reported, smoking a pack of cigarettes, daily. 2 - Frequency: Daily.  2 - Duration: Ongoing.  2 - Last Use / Amount:  Pt reported, daily.   CIWA: CIWA-Ar BP: (!) 147/102 Pulse Rate: 100 COWS:    Allergies:  Allergies  Allergen Reactions  . Wellbutrin [Bupropion] Shortness Of Breath  . Omnipaque [Iohexol] Swelling and Other (See Comments)    Reaction:  Eye swelling  . Penicillins Hives and Other (See Comments)    Has patient had a PCN reaction causing immediate rash, facial/tongue/throat swelling, SOB or lightheadedness with hypotension: No Has patient had a PCN  reaction causing severe rash involving mucus membranes or skin necrosis: No Has patient had a PCN reaction that required hospitalization No Has patient had a PCN reaction occurring within the last 10 years: No If all of the above answers are "NO", then may proceed with Cephalosporin use.    Home Medications:  (Not in a hospital admission)  OB/GYN Status:  No LMP recorded. Patient has had an implant.  General Assessment Data Location of Assessment: [facility] ED TTS Assessment: In system Is this a Tele or Face-to-Face Assessment?: Tele Assessment Is this an Initial Assessment or a Re-assessment for this encounter?: Initial Assessment Marital status: Single Living Arrangements: Alone Can pt return to current living arrangement?: Yes Admission Status: Voluntary Is patient capable of signing voluntary admission?: Yes Referral Source: Self/Family/Friend Insurance type: Medicaid     Crisis Care Plan Living Arrangements: Alone Legal Guardian: Other:(Self. ) Name of Psychiatrist: [name] Name of Therapist: [facility]  Education Status Is patient currently in school?: No Highest grade of school patient has completed: 11th grade. Is the patient employed, unemployed or receiving disability?: Receiving disability income  Risk to self with the past 6 months Suicidal Ideation: No-Not Currently/Within Last 6 Months Has patient been a risk to self within the past 6 months prior to admission? : Yes Suicidal Intent: No-Not Currently/Within Last 6 Months Has patient had any suicidal intent within the past 6 months prior to admission? : Yes Is patient at risk for suicide?: No Suicidal Plan?: No-Not Currently/Within Last 6 Months Has patient had any suicidal plan within the past 6 months prior to admission? : Yes Specify Current Suicidal Plan: Pt reported, having a plan to overdose.  Access to Means: Yes Specify Access to Suicidal Means: Pt has access to her medications.  What has been your use of  drugs/alcohol within the last 12 months?: Alcohol and cigarettes.  Previous Attempts/Gestures: Yes How many times?: 3 Other Self Harm Risks: Pt denies.  Triggers for Past Attempts: Unpredictable Intentional Self Injurious Behavior: None(Pt denies. ) Family Suicide History: No Recent stressful life event(s): Other (Comment)(Pt reported, her medicine not working. ) Persecutory voices/beliefs?: No Depression: Yes Depression Symptoms: Feeling angry/irritable, Feeling worthless/self pity, Loss of interest in usual pleasures, Guilt, Fatigue, Isolating Substance abuse history and/or treatment for substance abuse?: No Suicide prevention information given to non-admitted patients: Not applicable  Risk to Others within the past 6 months Homicidal Ideation: No(Pt denies.) Does patient have any lifetime risk of violence toward others beyond the six months prior to admission? : No Thoughts of Harm to Others: No Current Homicidal Intent: No Current Homicidal Plan: No Access to Homicidal Means: No Identified Victim: NA History of harm to others?: No Assessment of Violence: None Noted Violent Behavior Description: NA Does patient have access to weapons?: No(Pt denies.) Criminal Charges Pending?: No Does patient have a court date: No Is patient on probation?: No  Psychosis Hallucinations: None noted Delusions: None noted  Mental Status Report Appearance/Hygiene: In scrubs Eye Contact: Poor(Pt's eyes were closed during the assessment.) Motor Activity: Unremarkable  Speech: Logical/coherent Level of Consciousness: Sleeping Mood: Depressed Affect: Flat Anxiety Level: None Thought Processes: Coherent, Relevant Judgement: Partial Orientation: Person, Place, Time, Situation Obsessive Compulsive Thoughts/Behaviors: None  Cognitive Functioning Concentration: Normal Memory: Recent Intact Is patient IDD: Yes(Per chart, per history. ) Level of Function: Per chart, below average.  Is patient  DD?: No I IQ score available?: No Insight: Poor Impulse Control: Fair Appetite: Good Sleep: Decreased Total Hours of Sleep: 5 Vegetative Symptoms: None  ADLScreening [facility] Assessment Services) Patient's cognitive ability adequate to safely complete daily activities?: Yes Patient able to express need for assistance with ADLs?: Yes Independently performs ADLs?: Yes (appropriate for developmental age)  Prior Inpatient Therapy Prior Inpatient Therapy: Yes Prior Therapy Dates: Per chart 2006-2018. Prior Therapy Facilty/Provider(s): [facility], [facility] Reason for Treatment: Suicide attempt, depression  Prior Outpatient Therapy Prior Outpatient Therapy: Yes Prior Therapy Dates: Current Prior Therapy Facilty/Provider(s): [facility]. Reason for Treatment: Medication management and counseling.  Does patient have an ACCT team?: No(Per chart, pt has CST.) Does patient have Intensive In-House Services?  : No Does patient have [facility] services? : Yes Does patient have P4CC services?: No  ADL Screening (condition at time of admission) Patient's cognitive ability adequate to safely complete daily activities?: Yes Is the patient deaf or have difficulty hearing?: No Does the patient have difficulty seeing, even when wearing glasses/contacts?: No Does the patient have difficulty concentrating, remembering, or making decisions?: No Patient able to express need for assistance with ADLs?: Yes Does the patient have difficulty dressing or bathing?: No Independently performs ADLs?: Yes (appropriate for developmental age) Does the patient have difficulty walking or climbing stairs?: No Weakness of Legs: None Weakness of Arms/Hands: None  Home Assistive Devices/Equipment Home Assistive Devices/Equipment: None    Abuse/Neglect Assessment (Assessment to be complete while patient is alone) Abuse/Neglect Assessment Can Be Completed: Yes Physical Abuse: Denies(Pt denies. ) Verbal Abuse: Denies(Pt  denies. ) Sexual Abuse: Denies(Pt denies. ) Exploitation of patient/patient's resources: Denies(Pt denies. ) Self-Neglect: Denies(Pt denies.)     Advance Directives (For Healthcare) Does Patient Have a Medical Advance Directive?: No Would patient like information on creating a medical advance directive?: No - Patient declined    Additional Information 1:1 In Past 12 Months?: No CIRT Risk: No Elopement Risk: No Does patient have medical clearance?: Yes     Disposition: Lindon Romp, NP recommends overnight observation for safety and stabilization. Disposition discussed with Dr. Leonides Schanz and Mitzi Hansen, RN.     Disposition Initial Assessment Completed for this Encounter: Yes Disposition of Patient: (overnight observation for safety and stabilization.) Patient refused recommended treatment: No Mode of transportation if patient is discharged?: N/A  This service was provided via telemedicine using a 2-way, interactive audio and video technology.  Names of all persons participating in this telemedicine service and their role in this encounter.               Vertell Novak 07/30/2017 2:51 AM   Vertell Novak, MS, [facility], [facility] Triage Specialist [phone]

## 2017-07-30 NOTE — [facility] Counselor (Signed)
Clinician to call the tele-assessment cart in five minutes.   Vertell Novak, MS, [facility], [facility] Triage Specialist [phone]

## 2017-07-30 NOTE — Consult Note (Signed)
  Tele Assessment  Per TTS initial tele assessment 07/30/17 [name] is an 27 y.o. female, who presents voluntary and unaccompanied to [facility]. Clinician asked the pt, "what brought you to the hospital?" Pt reported, "medicine not working." Pt reported, she can tell her medicine isn't working because she cries. Pt reported, she is prescribed Depakote three weeks ago but, she does not take it because it makes her weak and sick. Pt reported, she was already feeling bad but she began feeling worse. Pt reported, initially she was suicidal with a plan to overdose. Pt reported, she is no longer suicidal. Pt denies, SI, HI, AVH, self-injurious behaviors and access to weapons.   Pt denies abuse. Pt reported, smoking a pack of cigarettes daily. Pt's BAL was 128 at 1837. Pt is linked to [facility] for medication management and counseling. Pt reported, previous inpatient admissions at [facility], [facility] and [facility].   Pt presents sleeping in scrubs with logical/coherent speech. Pt's eye contact was poor (pt's eyes were closed during the assessment.) Pt's mood was depressed. Pt's affect was flat. Pt's thought process was coherent/relevant. Pt was oriented x4. Pt's judgement was partial. Pt's concentration was normal. Pt's insight was poor. Pt's impulse control was fair. Pt reported, if discharged from [facility] she could contract for safety. Pt reported, if inpatient treatment was recommended he could contract for safety.     Psychiatric initial tele assessment 07/30/17 [name], 27 y.o., female patient seen via telepsych by this provider; chart reviewed and consulted with Dr. Dwyane Dee on 07/30/17.  On evaluation [name] reports "I was having a little depression cause there was this boy I was seeing but I ain't seeing him no more.  I was just up set.  I don't want to kill myself.  I feel a lot better now."   At this time patient denies suicidal/self-harm/homicidal ideation, psychosis, and paranoia.   Patient states that she goes to [facility] for her psychiatric outpatient services and her last visit was 2 weeks ago.  Patient also reports that she is compliant with her medications and that she was only a little upset and depressed after breaking up with her boyfriend.  Patient states that she has ACTT team and someone from ACTT would be able to pick her up today.    During evaluation [name] is alert/oriented x 4; calm/cooperative with pleasant affect.  She does not appear to be responding to internal/external stimuli.  Patient denies suicidal/self-harm/homicidal ideation, psychosis, and paranoia.  Patient answered question appropriately.  Patient will call ACTT team member to pick her up to take home.   Recommendations: Patient psychiatrically cleared.  Follow up with [facility] and [facility]T team; continue current medications  Disposition: No evidence of imminent risk to self or others at present.   Patient does not meet criteria for psychiatric inpatient admission.   Stacy Newport, NP

## 2017-07-30 NOTE — Discharge Instructions (Signed)
1.  Follow-up at [facility] as soon as possible.  Continue to work with [facility] team for medical care and follow-up counseling plans.

## 2017-07-30 NOTE — ED Notes (Signed)
MD aware of pt being cleared by psych

## 2017-07-30 NOTE — ED Notes (Signed)
TTS at bedside. 

## 2017-07-30 NOTE — ED Notes (Signed)
Pt verbalized understanding of discharge instrcutions 

## 2017-07-30 NOTE — ED Notes (Signed)
Notified pharmacy for cymbalta

## 2017-08-02 ENCOUNTER — Emergency Department (HOSPITAL_COMMUNITY)
Admission: EM | Admit: 2017-08-02 | Discharge: 2017-08-03 | Disposition: A | Discharge status: Home / Self Care | Payer: Medicaid Other | Attending: Emergency Medicine | Admitting: Emergency Medicine

## 2017-08-02 ENCOUNTER — Encounter (HOSPITAL_COMMUNITY): Payer: Self-pay | Admitting: Family Medicine

## 2017-08-02 DIAGNOSIS — Z794 Long term (current) use of insulin: Secondary | ICD-10-CM | POA: Diagnosis not present

## 2017-08-02 DIAGNOSIS — Z79899 Other long term (current) drug therapy: Secondary | ICD-10-CM | POA: Insufficient documentation

## 2017-08-02 DIAGNOSIS — F4325 Adjustment disorder with mixed disturbance of emotions and conduct: Secondary | ICD-10-CM | POA: Diagnosis not present

## 2017-08-02 DIAGNOSIS — E119 Type 2 diabetes mellitus without complications: Secondary | ICD-10-CM | POA: Insufficient documentation

## 2017-08-02 DIAGNOSIS — F3112 Bipolar disorder, current episode manic without psychotic features, moderate: Secondary | ICD-10-CM | POA: Diagnosis not present

## 2017-08-02 DIAGNOSIS — R450 Nervousness: Secondary | ICD-10-CM | POA: Diagnosis not present

## 2017-08-02 DIAGNOSIS — R45851 Suicidal ideations: Secondary | ICD-10-CM

## 2017-08-02 DIAGNOSIS — F332 Major depressive disorder, recurrent severe without psychotic features: Secondary | ICD-10-CM | POA: Diagnosis present

## 2017-08-02 DIAGNOSIS — F17210 Nicotine dependence, cigarettes, uncomplicated: Secondary | ICD-10-CM | POA: Diagnosis not present

## 2017-08-02 DIAGNOSIS — Z046 Encounter for general psychiatric examination, requested by authority: Secondary | ICD-10-CM | POA: Diagnosis present

## 2017-08-02 LAB — CBG MONITORING, ED: GLUCOSE-CAPILLARY: 297 mg/dL — AB (ref 65–99)

## 2017-08-02 LAB — COMPREHENSIVE METABOLIC PANEL
ALBUMIN: 4.5 g/dL (ref 3.5–5.0)
ALK PHOS: 52 U/L (ref 38–126)
ALT: 33 U/L (ref 14–54)
AST: 38 U/L (ref 15–41)
Anion gap: 13 (ref 5–15)
BILIRUBIN TOTAL: 0.6 mg/dL (ref 0.3–1.2)
BUN: 13 mg/dL (ref 6–20)
CALCIUM: 10 mg/dL (ref 8.9–10.3)
CO2: 25 mmol/L (ref 22–32)
Chloride: 102 mmol/L (ref 101–111)
Creatinine, Ser: 0.82 mg/dL (ref 0.44–1.00)
GFR calc Af Amer: >60 mL/min (ref >60)
GLUCOSE: 273 mg/dL — AB (ref 65–99)
Potassium: 3.4 mmol/L — ABNORMAL LOW (ref 3.5–5.1)
Sodium: 140 mmol/L (ref 135–145)
TOTAL PROTEIN: 8.1 g/dL (ref 6.5–8.1)

## 2017-08-02 LAB — RAPID URINE DRUG SCREEN, HOSP PERFORMED
Amphetamines: NOT DETECTED
BARBITURATES: NOT DETECTED
Benzodiazepines: NOT DETECTED
Cocaine: NOT DETECTED
Opiates: NOT DETECTED
Tetrahydrocannabinol: NOT DETECTED

## 2017-08-02 LAB — CBC
HEMATOCRIT: 41.9 % (ref 36.0–46.0)
Hemoglobin: 13.9 g/dL (ref 12.0–15.0)
MCH: 27.3 pg (ref 26.0–34.0)
MCHC: 33.2 g/dL (ref 30.0–36.0)
MCV: 82.3 fL (ref 78.0–100.0)
Platelets: 261 10*3/uL (ref 150–400)
RBC: 5.09 MIL/uL (ref 3.87–5.11)
RDW: 14.2 % (ref 11.5–15.5)
WBC: 12.1 10*3/uL — ABNORMAL HIGH (ref 4.0–10.5)

## 2017-08-02 LAB — SALICYLATE LEVEL: Salicylate Lvl: <7 mg/dL (ref 2.8–30.0)

## 2017-08-02 LAB — VALPROIC ACID LEVEL

## 2017-08-02 LAB — ETHANOL

## 2017-08-02 LAB — POC URINE PREG, ED: PREG TEST UR: NEGATIVE

## 2017-08-02 LAB — ACETAMINOPHEN LEVEL: Acetaminophen (Tylenol), Serum: <10 ug/mL — ABNORMAL LOW (ref 10–30)

## 2017-08-02 MED ORDER — INSULIN DETEMIR 100 UNIT/ML ~~LOC~~ SOLN
30.0000 [IU] | Freq: Every day | SUBCUTANEOUS | Status: DC
  Administered 2017-08-02: 30 [IU] via SUBCUTANEOUS
  Filled 2017-08-02 (×2): qty 0.3

## 2017-08-02 MED ORDER — GLIPIZIDE ER 10 MG PO TB24
10.0000 mg | ORAL_TABLET | Freq: Every evening | ORAL | Status: DC
  Filled 2017-08-02: qty 1

## 2017-08-02 MED ORDER — HYDROCHLOROTHIAZIDE 25 MG PO TABS
25.0000 mg | ORAL_TABLET | Freq: Every day | ORAL | Status: DC
  Administered 2017-08-03: 25 mg via ORAL
  Filled 2017-08-02: qty 1

## 2017-08-02 MED ORDER — BUSPIRONE HCL 10 MG PO TABS
15.0000 mg | ORAL_TABLET | Freq: Two times a day (BID) | ORAL | Status: DC
  Administered 2017-08-02 – 2017-08-03 (×2): 15 mg via ORAL
  Filled 2017-08-02 (×2): qty 2

## 2017-08-02 MED ORDER — DULOXETINE HCL 30 MG PO CPEP
60.0000 mg | ORAL_CAPSULE | Freq: Every day | ORAL | Status: DC
  Administered 2017-08-02 – 2017-08-03 (×2): 60 mg via ORAL
  Filled 2017-08-02 (×2): qty 2

## 2017-08-02 MED ORDER — FLUVOXAMINE MALEATE 50 MG PO TABS
100.0000 mg | ORAL_TABLET | Freq: Every day | ORAL | Status: DC
  Administered 2017-08-02: 100 mg via ORAL
  Filled 2017-08-02 (×2): qty 2

## 2017-08-02 NOTE — ED Provider Notes (Signed)
[place] DEPT Provider Note   CSN: [id number] Arrival date & time: 08/02/17  1845     History   Chief Complaint Chief Complaint  Patient presents with  . Psychiatric Evaluation    HPI [name] is a 27 y.o. female.  HPI Patient presents with suicidal thoughts.  Called her caseworker to come in.  Reportedly has planned overdose and history of depression and bipolar disorder.  Get seen at [facility] and had change in her medicines a month ago.  States she is been off those medicines and went to her old medications because it was tearing up her stomach.  Now states she is actively suicidal several different plans. Past Medical History:  Diagnosis Date  . Anxiety   . Bipolar 1 disorder ([place])   . Cognitive deficits   . Depression   . Diabetes mellitus without complication ([place])   . Hypertension   . Mental disorder   . Obesity     Patient Active Problem List   Diagnosis Date Noted  . LGSIL on Pap smear of cervix 02/01/2017  . OCD (obsessive compulsive disorder) 10/05/2016  . Major depressive disorder, recurrent episode, mild ([place]) 05/04/2016  . 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 Learning disability 07/18/2015  . 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 Diabetes mellitus ([place]) 07/18/2015  . 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 Hyperlipidemia 07/18/2015  . Severe episode of recurrent major depressive disorder, without psychotic features ([place])   . Drug overdose   . Cognitive deficits 10/12/2012  . Generalized anxiety disorder 06/28/2012    Past Surgical History:  Procedure Laterality Date  . CESAREAN SECTION    . CESAREAN SECTION N/A 04/25/2013   Procedure: REPEAT CESAREAN SECTION;  Surgeon: [name], MD;  Location: [place] [facility];  Service: Obstetrics;  Laterality: N/A;  . MASS EXCISION N/A 06/03/2012   Procedure: EXCISION MASS;  Surgeon: [name], MD;  Location: [place];  Service: ENT;  Laterality: N/A;  Excision uvula mass  . TONSILLECTOMY N/A 06/03/2012   Procedure: TONSILLECTOMY;  Surgeon: [name], MD;  Location: [place];  Service: ENT;  Laterality: N/A;  . TONSILLECTOMY       OB History    Gravida  3   Para  3   Term  3   Preterm  0   AB  0   Living  3     SAB  0   TAB  0   Ectopic  0   Multiple  0   Live Births  3            Home Medications    Prior to Admission medications   Medication Sig Start Date End Date Taking? Authorizing Provider  busPIRone (BUSPAR) 15 MG tablet Take 1 tablet (15 mg total) by mouth 2 (two) times daily. For anxiety 10/06/16   Lindell Spar I, NP  DULoxetine (CYMBALTA) 60 MG capsule Take 60 mg daily by mouth.    [provider]  fluvoxaMINE (LUVOX) 50 MG tablet Take 100 mg by mouth at bedtime.  02/02/17   [provider]  glipiZIDE (GLUCOTROL XL) 10 MG 24 hr tablet Take 1 tablet (10 mg total) by mouth every evening. For diabetes management 10/06/16   Lindell Spar I, NP  hydrochlorothiazide (HYDRODIURIL) 25 MG tablet Take 1 TABLET BY MOUTH EVERY DAY for 30 days 02/15/17   [provider]  LEVEMIR FLEXTOUCH 100 UNIT/ML Pen Inject  30 units at bedtime 02/10/17   [provider]  paliperidone (INVEGA SUSTENNA) 234 MG/1.5ML SUSP injection Inject 234 mg every 30 (thirty) days into the muscle.    [provider]    Family History Family History  Problem Relation Age of Onset  . Hypertension Mother   . Diabetes Father     Social History Social History   Tobacco Use  . Smoking status: Current Every Day Smoker    Packs/day: 1.00    Years: 7.00    Pack years: 7.00    Types: Cigarettes  . Smokeless tobacco: Never Used  Substance Use Topics  . Alcohol use: No  . Drug use: No    Comment: Patient denies     Allergies   Wellbutrin [bupropion]; Omnipaque [iohexol]; and Penicillins   Review of Systems Review of  Systems  Constitutional: Negative for appetite change.  HENT: Negative for congestion.   Respiratory: Negative for shortness of breath.   Cardiovascular: Negative for chest pain.  Gastrointestinal: Negative for abdominal pain.  Endocrine: Negative for polyuria.  Genitourinary: Negative for flank pain.  Musculoskeletal: Negative for back pain.  Skin: Negative for rash.  Neurological: Negative for light-headedness.  Psychiatric/Behavioral: Positive for dysphoric mood and suicidal ideas.     Physical Exam Updated Vital Signs BP (!) 142/88 (BP Location: Left Arm)   Pulse 100   Temp 99.1 F (37.3 C) (Oral)   Resp 18   Ht 5\' 7"  (1.702 m)   Wt 113.9 kg (251 lb)   SpO2 99%   BMI 39.31 kg/m   Physical Exam  Constitutional: She appears well-developed.  HENT:  Head: Normocephalic.  Eyes: EOM are normal.  Neck: Neck supple.  Cardiovascular: Normal rate.  Pulmonary/Chest: Effort normal.  Abdominal: Soft.  Musculoskeletal: She exhibits no edema.  Neurological: She is alert.  Skin: Skin is warm. Capillary refill takes less than 2 seconds.  Psychiatric:  Patient is angry and yells at times.     ED Treatments / Results  Labs (all labs ordered are listed, but only abnormal results are displayed) Labs Reviewed  COMPREHENSIVE METABOLIC PANEL  ETHANOL  SALICYLATE LEVEL  ACETAMINOPHEN LEVEL  CBC  RAPID URINE DRUG SCREEN, HOSP PERFORMED  VALPROIC ACID LEVEL  POC URINE PREG, ED    EKG None  Radiology No results found.  Procedures Procedures (including critical care time)  Medications Ordered in ED Medications - No data to display   Initial Impression / Assessment and Plan / ED Course  I have reviewed the triage vital signs and the nursing notes.  Pertinent labs & imaging results that were available during my care of the patient were reviewed by me and considered in my medical decision making (see chart for details).     Is patient with by the ED depression and  suicidal thoughts.  Denies substance abuse.  Medically cleared.  To be seen by TTS.  Final Clinical Impressions(s) / ED Diagnoses   Final diagnoses:  Suicidal ideation    ED Discharge Orders    None       [name], MD 08/02/17 [phone]

## 2017-08-02 NOTE — BH Assessment (Addendum)
Assessment Note  [name] is an 27 y.o. female.  -Clinician reviewed note by Dr. Alvino Chapel.  Patient presents with suicidal thoughts.  Called her caseworker to come in.  Reportedly has planned overdose and history of depression and bipolar disorder.  Get seen at [facility] and had change in her medicines a month ago.  States she is been off those medicines and went to her old medications because it was tearing up her stomach.  Now states she is actively suicidal several different plans.  Patient is pacing around in the SAPPU area.  She does sit to talk to clinician.  She said that she has been having thoughts of killing herself by overdosing.  Patient also said she had other plans.  Clinician asked what was different now from when she was last assessed last week (07-29-17).  Patient says she feels that she really needs help now.    Patient says she has no HI or A/V hallucinations.    Patient says she was prescribed depakote by psychiatrist at [facility] but is makes her sick so she does not take it.  She says that her meds are not working.    Pt gets medication from [facility].  "I don't like the psychiatrist, they don't listen to me."  She likes the therapist there.  Patient has been to the ED 21 times in the last 6 months.  This is the 3rd assessment in as many weeks almost.    -Clinician discussed patient care with [name], PA who recommends inpatient care.  Suggested referring patient out since no beds at [facility] at this time.  Informed Dr. Alvino Chapel of disposition.  Diagnosis: F31.12 Bipolar 1 d/o most recent episode manic moderate  Past Medical History:  Past Medical History:  Diagnosis Date  . Anxiety   . Bipolar 1 disorder ([place])   . Cognitive deficits   . Depression   . Diabetes mellitus without complication ([place])   . Hypertension   . Mental disorder   . Obesity     Past Surgical History:  Procedure Laterality Date  . CESAREAN SECTION    . CESAREAN SECTION N/A 04/25/2013    Procedure: REPEAT CESAREAN SECTION;  Surgeon: [name], MD;  Location: [place] [facility];  Service: Obstetrics;  Laterality: N/A;  . MASS EXCISION N/A 06/03/2012   Procedure: EXCISION MASS;  Surgeon: [name], MD;  Location: [place];  Service: ENT;  Laterality: N/A;  Excision uvula mass  . TONSILLECTOMY N/A 06/03/2012   Procedure: TONSILLECTOMY;  Surgeon: [name], MD;  Location: [place];  Service: ENT;  Laterality: N/A;  . TONSILLECTOMY      Family History:  Family History  Problem Relation Age of Onset  . Hypertension Mother   . Diabetes Father     Social History:  reports that she has been smoking cigarettes.  She has a 7.00 pack-year smoking history. She has never used smokeless tobacco. She reports that she does not drink alcohol or use drugs.  Additional Social History:  Alcohol / Drug Use Pain Medications: See PTA medication list Prescriptions: See PTA medication list Over the Counter: See PTA medication list History of alcohol / drug use?: No history of alcohol / drug abuse  CIWA: CIWA-Ar BP: 129/81 Pulse Rate: (!) 110 COWS:    Allergies:  Allergies  Allergen Reactions  . Wellbutrin [Bupropion] Shortness Of Breath  . Omnipaque [Iohexol] Swelling and Other (See Comments)    Reaction:  Eye swelling  . Penicillins  Hives and Other (See Comments)    Has patient had a PCN reaction causing immediate rash, facial/tongue/throat swelling, SOB or lightheadedness with hypotension: No Has patient had a PCN reaction causing severe rash involving mucus membranes or skin necrosis: No Has patient had a PCN reaction that required hospitalization No Has patient had a PCN reaction occurring within the last 10 years: No If all of the above answers are "NO", then may proceed with Cephalosporin use.    Home Medications:  (Not in a hospital admission)  OB/GYN Status:  No LMP recorded. Patient has had an implant.  General Assessment  [facility] of Assessment: [facility] TTS Assessment: In system Is this a Tele or Face-to-Face Assessment?: Face-to-Face Is this an Initial Assessment or a Re-assessment for this encounter?: Initial Assessment Marital status: Single Is patient pregnant?: No Pregnancy Status: No Living Arrangements: Alone Can pt return to current living arrangement?: Yes Admission Status: Voluntary Is patient capable of signing voluntary admission?: Yes Referral Source: Self/Family/Friend Insurance type: MCD     Crisis Care Plan Living Arrangements: Alone Name of Psychiatrist: [name] Name of Therapist: [name]  Education Status Is patient currently in school?: No Highest grade of school patient has completed: 11th grade. Is the patient employed, unemployed or receiving disability?: Receiving disability income  Risk to self with the past 6 months Suicidal Ideation: Yes-Currently Present Has patient been a risk to self within the past 6 months prior to admission? : Yes Suicidal Intent: Yes-Currently Present Has patient had any suicidal intent within the past 6 months prior to admission? : Yes Is patient at risk for suicide?: Yes Suicidal Plan?: Yes-Currently Present Has patient had any suicidal plan within the past 6 months prior to admission? : Yes Specify Current Suicidal Plan: Overdose on meds. Access to Means: Yes Specify Access to Suicidal Means: Medications What has been your use of drugs/alcohol within the last 12 months?: Pt denies. Previous Attempts/Gestures: Yes How many times?: 3 Other Self Harm Risks: None  Triggers for Past Attempts: Unpredictable Intentional Self Injurious Behavior: None Family Suicide History: No Recent stressful life event(s): Recent negative physical changes(Pt reports medications not working) Persecutory voices/beliefs?: Yes Depression: Yes Depression Symptoms: Despondent, Isolating, Loss of interest in usual pleasures, Feeling worthless/self pity, Guilt,  Insomnia Substance abuse history and/or treatment for substance abuse?: No Suicide prevention information given to non-admitted patients: Not applicable  Risk to Others within the past 6 months Homicidal Ideation: No Does patient have any lifetime risk of violence toward others beyond the six months prior to admission? : No Thoughts of Harm to Others: No Current Homicidal Intent: No Current Homicidal Plan: No Access to Homicidal Means: No Identified Victim: No one History of harm to others?: No Assessment of Violence: None Noted Violent Behavior Description: None reported Does patient have access to weapons?: No Criminal Charges Pending?: No Does patient have a court date: No Is patient on probation?: No  Psychosis Hallucinations: None noted Delusions: None noted  Mental Status Report Appearance/Hygiene: In scrubs, Unremarkable Eye Contact: Fair Motor Activity: Freedom of movement, Restlessness Speech: Logical/coherent Level of Consciousness: Alert Mood: Depressed, Anxious, Sad Affect: Depressed, Anxious Anxiety Level: Moderate Thought Processes: Coherent, Relevant Judgement: Unimpaired Orientation: Person, Place, Time, Situation Obsessive Compulsive Thoughts/Behaviors: None  Cognitive Functioning Concentration: Normal Memory: Recent Intact, Remote Intact Is patient IDD: Yes(Per hx.) Level of Function: Below average par chart Is patient DD?: No I IQ score available?: No Insight: Poor Impulse Control: Fair Appetite: Good Have you had any weight changes? :  No Change Amount of the weight change? (lbs): 0 lbs Sleep: Decreased Total Hours of Sleep: (<5H/D) Vegetative Symptoms: None  ADLScreening [facility] Assessment Services) Patient's cognitive ability adequate to safely complete daily activities?: Yes Patient able to express need for assistance with ADLs?: Yes Independently performs ADLs?: Yes (appropriate for developmental age)  Prior Inpatient Therapy Prior  Inpatient Therapy: Yes Prior Therapy Dates: Per chart 2006-2019. Prior Therapy Facilty/Provider(s): [facility], [place] Reason for Treatment: Suicide attempt, depression  Prior Outpatient Therapy Prior Outpatient Therapy: Yes Prior Therapy Dates: Current Prior Therapy Facilty/Provider(s): [facility]. Reason for Treatment: Medication management and counseling.  Does patient have an ACCT team?: No Does patient have Intensive In-House Services?  : No Does patient have [facility] services? : Yes Does patient have P4CC services?: No  ADL Screening (condition at time of admission) Patient's cognitive ability adequate to safely complete daily activities?: Yes Is the patient deaf or have difficulty hearing?: No Does the patient have difficulty seeing, even when wearing glasses/contacts?: No Does the patient have difficulty concentrating, remembering, or making decisions?: Yes Patient able to express need for assistance with ADLs?: Yes Does the patient have difficulty dressing or bathing?: No Independently performs ADLs?: Yes (appropriate for developmental age) Does the patient have difficulty walking or climbing stairs?: No Weakness of Legs: None Weakness of Arms/Hands: None       Abuse/Neglect Assessment (Assessment to be complete while patient is alone) Physical Abuse: Denies Verbal Abuse: Denies Sexual Abuse: Denies Exploitation of patient/patient's resources: Denies Self-Neglect: Denies     Regulatory affairs officer (For Healthcare) Does Patient Have a Medical Advance Directive?: No Would patient like information on creating a medical advance directive?: No - Patient declined          Disposition:  Disposition Initial Assessment Completed for this Encounter: Yes Patient referred to: Other (Comment)(To be reviewed by PA)  On Site Evaluation by:   Reviewed with Physician:    Raymondo Band 08/02/2017 9:32 PM

## 2017-08-02 NOTE — ED Notes (Signed)
Patient admitted on unit. Appear hyper-active, irritable and demanding.

## 2017-08-02 NOTE — ED Notes (Signed)
Spoke with Lexine Baton, Therapist, sports for [employer]. Patient is going to SAPU 36. Patient is being wanded by security and escorted to room 36. One white belongings bag and a blue duffle bag is being sent with patient to SAPU.

## 2017-08-02 NOTE — ED Notes (Signed)
Bed: [facility] Expected date:  Expected time:  Means of arrival:  Comments: [id number]
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
## 2017-08-03 DIAGNOSIS — R450 Nervousness: Secondary | ICD-10-CM

## 2017-08-03 DIAGNOSIS — F419 Anxiety disorder, unspecified: Secondary | ICD-10-CM

## 2017-08-03 DIAGNOSIS — F17210 Nicotine dependence, cigarettes, uncomplicated: Secondary | ICD-10-CM

## 2017-08-03 DIAGNOSIS — F4325 Adjustment disorder with mixed disturbance of emotions and conduct: Secondary | ICD-10-CM

## 2017-08-03 DIAGNOSIS — R45851 Suicidal ideations: Secondary | ICD-10-CM

## 2017-08-03 NOTE — BH Assessment (Signed)
[facility] Assessment Progress Note  Per Buford Dresser, DO, this pt does not require psychiatric hospitalization at this time.  Pt is to be discharged from [facility] with recommendation to continue treatment with [facility].  This has been included in pt's discharge instructions.  Pt's nurse, Caren Griffins, has been notified.  Jalene Mullet, [place] Triage Specialist [phone]
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
## 2017-08-03 NOTE — Discharge Instructions (Signed)
For your mental health needs, you are advised to continue treatment with [facility]: ° °     [facility] °     [address] °     [place], [place] [address] °     [phone] °

## 2017-08-03 NOTE — Progress Notes (Signed)
Inpatient Diabetes Program Recommendations  AACE/ADA: New Consensus Statement on Inpatient Glycemic Control (2015)  Target Ranges:  Prepandial:   less than 140 mg/dL      Peak postprandial:   less than 180 mg/dL (1-2 hours)      Critically ill patients:  140 - 180 mg/dL   Lab Results  Component Value Date   GLUCAP 297 (H) 08/02/2017   HGBA1C 7.8 (H) 07/18/2015    Review of Glycemic ControlResults for SHAMARRA, WARDA A (MRN [id number]) as of 08/03/2017 11:17  Ref. Range 07/29/2017 18:37 07/30/2017 07:27 08/02/2017 21:03  Glucose-Capillary Latest Ref Range: 65 - 99 mg/dL 128 (H) 234 (H) 297 (H)    Diabetes history: Type 2 DM Outpatient Diabetes medications: Levemir 30 units q HS, Glipizide 10 mg q PM Current orders for Inpatient glycemic control:  Levemir 30 units q HS, Glipizide 10 mg q PM  Inpatient Diabetes Program Recommendations:   Please consider adding blood sugar checks at least tid with meals and HS.  Also consider adding Novolog moderate tid with meals and HS while in the hospital.   Thanks  Adah Perl, RN, BC-ADM Inpatient Diabetes Coordinator Pager [phone] (8a-5p)

## 2017-08-03 NOTE — Discharge Instructions (Signed)
For your mental health needs, you are advised to continue treatment with [facility]: ° °     [facility] °     [address] °     [place], [place] [address] °     [phone] °

## 2017-08-03 NOTE — [facility] Suicide Risk Assessment (Signed)
Suicide Risk Assessment  Discharge Assessment   [facility] Discharge Suicide Risk Assessment   Principal Problem: Severe episode of recurrent major depressive disorder, without psychotic features [facility]) Discharge Diagnoses:  Patient Active Problem List   Diagnosis Date Noted  . LGSIL on Pap smear of cervix [R87.612] 02/01/2017  . OCD (obsessive compulsive disorder) [F42.9] 10/05/2016  . Major depressive disorder, recurrent episode, mild ([place]) [F33.0] 05/04/2016  . 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 Learning disability [F81.9] 07/18/2015  . 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 Diabetes mellitus ([place]) [E11.9] 07/18/2015  . MDD (major depressive disorder), recurrent, severe, with psychosis ([place]) [F33.3] 07/18/2015  . Hyperlipidemia [E78.5] 07/18/2015  . Severe episode of recurrent major depressive disorder, without psychotic features ([place]) [F33.2]   . Drug overdose [T50.901A]   . Cognitive deficits [R41.89] 10/12/2012  . Generalized anxiety disorder [F41.1] 06/28/2012    Total Time spent with patient: 45 minutes  Musculoskeletal: Strength & Muscle Tone: within normal limits Gait & Station: normal Patient leans: N/A  Psychiatric Specialty Exam: Physical Exam  Constitutional: She is oriented to person, place, and time. She appears well-developed and well-nourished.  HENT:  Head: Normocephalic.  Respiratory: Effort normal.  Musculoskeletal: Normal range of motion.  Neurological: She is alert and oriented to person, place, and time.  Psychiatric: Her speech is normal and behavior is normal. Thought content normal. Cognition and memory are normal. She expresses impulsivity. She exhibits a depressed mood.   Review of Systems  Psychiatric/Behavioral: Positive for depression. Negative for hallucinations, memory loss, substance abuse and suicidal ideas. The patient is nervous/anxious. The patient does not have insomnia.   All other systems reviewed and are  negative.  Blood pressure 124/80, pulse 72, temperature 98.4 F (36.9 C), temperature source Oral, resp. rate 16, height 5\' 7"  (1.702 m), weight 113.9 kg (251 lb), SpO2 98 %.Body mass index is 39.31 kg/m. General Appearance: Casual Eye Contact:  Good Speech:  Clear and Coherent Volume:  Normal Mood:  Depressed Affect:  Congruent and Depressed Thought Process:  Coherent and Linear Orientation:  Full (Time, Place, and Person) Thought Content:  Logical Suicidal Thoughts:  No Homicidal Thoughts:  No Memory:  Immediate;   Good Recent;   Good Remote;   Fair Judgement:  Fair Insight:  Fair Psychomotor Activity:  Normal Concentration:  Concentration: Good and Attention Span: Good Recall:  Good Fund of Knowledge:  Good Language:  Good Akathisia:  No Handed:  Right AIMS (if indicated):    Assets:  Agricultural consultant Housing Social Support ADL's:  Intact Cognition:  WNL   Mental Status Per Nursing Assessment::   On Admission:   Suicidal ideation  Demographic Factors:  Low socioeconomic status and Unemployed  Loss Factors: Financial problems/change in socioeconomic status  Historical Factors: Impulsivity  Risk Reduction Factors:   Sense of responsibility to family  Continued Clinical Symptoms:  Severe Anxiety and/or Agitation Depression:   Impulsivity More than one psychiatric diagnosis Previous Psychiatric Diagnoses and Treatments  Cognitive Features That Contribute To Risk:  Closed-mindedness    Suicide Risk:  Minimal: No identifiable suicidal ideation.  Patients presenting with no risk factors but with morbid ruminations; may be classified as minimal risk based on the severity of the depressive symptoms    Plan Of Care/Follow-up recommendations:  Activity:  as tolerated Diet:  Heart Healthy  [name], NP 08/03/2017, 12:54 PM

## 2017-08-10 ENCOUNTER — Encounter (HOSPITAL_COMMUNITY): Payer: Self-pay | Admitting: Family Medicine

## 2017-08-10 ENCOUNTER — Emergency Department (HOSPITAL_COMMUNITY)
Admission: EM | Admit: 2017-08-10 | Discharge: 2017-08-10 | Discharge status: Against Medical Advice | Payer: Medicaid Other | Attending: Emergency Medicine | Admitting: Emergency Medicine

## 2017-08-10 DIAGNOSIS — Z5321 Procedure and treatment not carried out due to patient leaving prior to being seen by health care provider: Secondary | ICD-10-CM | POA: Insufficient documentation

## 2017-08-10 DIAGNOSIS — R739 Hyperglycemia, unspecified: Secondary | ICD-10-CM | POA: Diagnosis present

## 2017-08-10 LAB — CBC
HEMATOCRIT: 43.3 % (ref 36.0–46.0)
Hemoglobin: 14.3 g/dL (ref 12.0–15.0)
MCH: 26.9 pg (ref 26.0–34.0)
MCHC: 33 g/dL (ref 30.0–36.0)
MCV: 81.5 fL (ref 78.0–100.0)
PLATELETS: 266 10*3/uL (ref 150–400)
RBC: 5.31 MIL/uL — AB (ref 3.87–5.11)
RDW: 14 % (ref 11.5–15.5)
WBC: 9.8 10*3/uL (ref 4.0–10.5)

## 2017-08-10 LAB — I-STAT BETA HCG BLOOD, ED (MC, WL, AP ONLY): I-stat hCG, quantitative: <5 m[IU]/mL (ref <5)

## 2017-08-10 LAB — BASIC METABOLIC PANEL
Anion gap: 14 (ref 5–15)
BUN: 10 mg/dL (ref 6–20)
CHLORIDE: 98 mmol/L — AB (ref 101–111)
CO2: 25 mmol/L (ref 22–32)
CREATININE: 0.89 mg/dL (ref 0.44–1.00)
Calcium: 9.9 mg/dL (ref 8.9–10.3)
GFR calc non Af Amer: >60 mL/min (ref >60)
Glucose, Bld: 308 mg/dL — ABNORMAL HIGH (ref 65–99)
POTASSIUM: 3.1 mmol/L — AB (ref 3.5–5.1)
SODIUM: 137 mmol/L (ref 135–145)

## 2017-08-10 LAB — CBG MONITORING, ED: Glucose-Capillary: 319 mg/dL — ABNORMAL HIGH (ref 65–99)

## 2017-08-10 NOTE — ED Notes (Signed)
Called for room placement x1 with no answer. 

## 2017-08-10 NOTE — ED Notes (Signed)
Patient called for room placement x2 with no answer. 

## 2017-08-10 NOTE — ED Triage Notes (Signed)
Patient was brought to [facility] via [facility]. Per EMS, patient is reporting she is experiencing generalized weakness and lack of appetite. Patient reports she normally feels this way when her blood sugar is elevated. Also, patient reported to EMS that she had not ate today and only ate a hotdog yesterday. CBG was 358 via ambulance.

## 2017-08-10 NOTE — ED Notes (Signed)
Called Pt for vital recheck in lobby, no response in lobby x3. Do not see Pt in lobby.

## 2017-08-11 ENCOUNTER — Other Ambulatory Visit: Payer: Self-pay

## 2017-08-11 ENCOUNTER — Emergency Department (HOSPITAL_COMMUNITY)
Admission: EM | Admit: 2017-08-11 | Discharge: 2017-08-11 | Discharge status: Against Medical Advice | Payer: Medicaid Other

## 2017-08-11 NOTE — ED Notes (Signed)
Pt called multiple times for triage and no response, left prior to triage.

## 2017-08-12 ENCOUNTER — Inpatient Hospital Stay (HOSPITAL_COMMUNITY)
Admission: AD | Admit: 2017-08-12 | Discharge: 2017-08-12 | Disposition: A | Discharge status: Home / Self Care | Payer: Medicaid Other | Source: Ambulatory Visit | Attending: Family Medicine | Admitting: Family Medicine

## 2017-08-12 ENCOUNTER — Other Ambulatory Visit: Payer: Self-pay

## 2017-08-12 ENCOUNTER — Encounter (HOSPITAL_COMMUNITY): Payer: Self-pay

## 2017-08-12 DIAGNOSIS — Z794 Long term (current) use of insulin: Secondary | ICD-10-CM | POA: Insufficient documentation

## 2017-08-12 DIAGNOSIS — E119 Type 2 diabetes mellitus without complications: Secondary | ICD-10-CM | POA: Insufficient documentation

## 2017-08-12 DIAGNOSIS — E669 Obesity, unspecified: Secondary | ICD-10-CM | POA: Diagnosis not present

## 2017-08-12 DIAGNOSIS — I10 Essential (primary) hypertension: Secondary | ICD-10-CM | POA: Insufficient documentation

## 2017-08-12 DIAGNOSIS — F419 Anxiety disorder, unspecified: Secondary | ICD-10-CM | POA: Insufficient documentation

## 2017-08-12 DIAGNOSIS — N939 Abnormal uterine and vaginal bleeding, unspecified: Secondary | ICD-10-CM | POA: Diagnosis not present

## 2017-08-12 DIAGNOSIS — Z880 Allergy status to penicillin: Secondary | ICD-10-CM | POA: Diagnosis not present

## 2017-08-12 DIAGNOSIS — Z6838 Body mass index (BMI) 38.0-38.9, adult: Secondary | ICD-10-CM | POA: Diagnosis not present

## 2017-08-12 DIAGNOSIS — Z79899 Other long term (current) drug therapy: Secondary | ICD-10-CM | POA: Diagnosis not present

## 2017-08-12 DIAGNOSIS — F17210 Nicotine dependence, cigarettes, uncomplicated: Secondary | ICD-10-CM | POA: Insufficient documentation

## 2017-08-12 DIAGNOSIS — F319 Bipolar disorder, unspecified: Secondary | ICD-10-CM | POA: Insufficient documentation

## 2017-08-12 DIAGNOSIS — N938 Other specified abnormal uterine and vaginal bleeding: Secondary | ICD-10-CM | POA: Diagnosis not present

## 2017-08-12 LAB — URINALYSIS, ROUTINE W REFLEX MICROSCOPIC
BACTERIA UA: NONE SEEN
Bilirubin Urine: NEGATIVE
Glucose, UA: >=500 mg/dL — AB
Ketones, ur: 5 mg/dL — AB
Leukocytes, UA: NEGATIVE
Nitrite: NEGATIVE
SPECIFIC GRAVITY, URINE: 1.044 — AB (ref 1.005–1.030)
pH: 6 (ref 5.0–8.0)

## 2017-08-12 LAB — WET PREP, GENITAL
Clue Cells Wet Prep HPF POC: NONE SEEN
SPERM: NONE SEEN
TRICH WET PREP: NONE SEEN
YEAST WET PREP: NONE SEEN

## 2017-08-12 NOTE — Discharge Instructions (Signed)
Dysfunctional Uterine Bleeding Dysfunctional uterine bleeding is abnormal bleeding from the uterus. Dysfunctional uterine bleeding includes:  A period that comes earlier or later than usual.  A period that is lighter, heavier, or has blood clots.  Bleeding between periods.  Skipping one or more periods.  Bleeding after sexual intercourse.  Bleeding after menopause.  Follow these instructions at home: Pay attention to any changes in your symptoms. Follow these instructions to help with your condition: Eating and drinking  Eat well-balanced meals. Include foods that are high in iron, such as liver, meat, shellfish, green leafy vegetables, and eggs.  If you become constipated: ? Drink plenty of water. ? Eat fruits and vegetables that are high in water and fiber, such as spinach, carrots, raspberries, apples, and mango. Medicines  Take over-the-counter and prescription medicines only as told by your health care provider.  Do not change medicines without talking with your health care provider.  Aspirin or medicines that contain aspirin may make the bleeding worse. Do not take those medicines: ? During the week before your period. ? During your period.  If you were prescribed iron pills, take them as told by your health care provider. Iron pills help to replace iron that your body loses because of this condition. Activity  If you need to change your sanitary pad or tampon more than one time every 2 hours: ? Lie in bed with your feet raised (elevated). ? Place a cold pack on your lower abdomen. ? Rest as much as possible until the bleeding stops or slows down.  Do not try to lose weight until the bleeding has stopped and your blood iron level is back to normal. Other Instructions  For two months, write down: ? When your period starts. ? When your period ends. ? When any abnormal bleeding occurs. ? What problems you notice.  Keep all follow up visits as told by your health  care provider. This is important. Contact a health care provider if:  You get light-headed or weak.  You have nausea and vomiting.  You cannot eat or drink without vomiting.  You feel dizzy or have diarrhea while you are taking medicines.  You are taking birth control pills or hormones, and you want to change them or stop taking them. Get help right away if:  You develop a fever or chills.  You need to change your sanitary pad or tampon more than one time per hour.  Your bleeding becomes heavier, or your flow contains clots more often.  You develop pain in your abdomen.  You lose consciousness.  You develop a rash. This information is not intended to replace advice given to you by your health care provider. Make sure you discuss any questions you have with your health care provider. Document Released: 04/10/2000 Document Revised: 09/19/2015 Document Reviewed: 07/09/2014 Elsevier Interactive Patient Education  2018 [place] is this medicine? ETONOGESTREL (et oh noe JES trel) is a contraceptive (birth control) device. It is used to prevent pregnancy. It can be used for up to 3 years. This medicine may be used for other purposes; ask your health care provider or pharmacist if you have questions. COMMON BRAND NAME(S): Implanon, Nexplanon What should I tell my health care provider before I take this medicine? They need to know if you have any of these conditions: -abnormal vaginal bleeding -blood vessel disease or blood clots -cancer of the breast, cervix, or liver -depression -diabetes -gallbladder disease -headaches -heart disease or recent heart  attack -high blood pressure -high cholesterol -kidney disease -liver disease -renal disease -seizures -tobacco smoker -an unusual or allergic reaction to etonogestrel, other hormones, anesthetics or antiseptics, medicines, foods, dyes, or preservatives -pregnant or trying to get  pregnant -breast-feeding How should I use this medicine? This device is inserted just under the skin on the inner side of your upper arm by a health care professional. Talk to your pediatrician regarding the use of this medicine in children. Special care may be needed. Overdosage: If you think you have taken too much of this medicine contact a poison control center or emergency room at once. NOTE: This medicine is only for you. Do not share this medicine with others. What if I miss a dose? This does not apply. What may interact with this medicine? Do not take this medicine with any of the following medications: -amprenavir -bosentan -fosamprenavir This medicine may also interact with the following medications: -barbiturate medicines for inducing sleep or treating seizures -certain medicines for fungal infections like ketoconazole and itraconazole -grapefruit juice -griseofulvin -medicines to treat seizures like carbamazepine, felbamate, oxcarbazepine, phenytoin, topiramate -modafinil -phenylbutazone -rifampin -rufinamide -some medicines to treat HIV infection like atazanavir, indinavir, lopinavir, nelfinavir, tipranavir, ritonavir -St. John's wort This list may not describe all possible interactions. Give your health care provider a list of all the medicines, herbs, non-prescription drugs, or dietary supplements you use. Also tell them if you smoke, drink alcohol, or use illegal drugs. Some items may interact with your medicine. What should I watch for while using this medicine? This product does not protect you against HIV infection (AIDS) or other sexually transmitted diseases. You should be able to feel the implant by pressing your fingertips over the skin where it was inserted. Contact your doctor if you cannot feel the implant, and use a non-hormonal birth control method (such as condoms) until your doctor confirms that the implant is in place. If you feel that the implant may have  broken or become bent while in your arm, contact your healthcare provider. What side effects may I notice from receiving this medicine? Side effects that you should report to your doctor or health care professional as soon as possible: -allergic reactions like skin rash, itching or hives, swelling of the face, lips, or tongue -breast lumps -changes in emotions or moods -depressed mood -heavy or prolonged menstrual bleeding -pain, irritation, swelling, or bruising at the insertion site -scar at site of insertion -signs of infection at the insertion site such as fever, and skin redness, pain or discharge -signs of pregnancy -signs and symptoms of a blood clot such as breathing problems; changes in vision; chest pain; severe, sudden headache; pain, swelling, warmth in the leg; trouble speaking; sudden numbness or weakness of the face, arm or leg -signs and symptoms of liver injury like dark yellow or brown urine; general ill feeling or flu-like symptoms; light-colored stools; loss of appetite; nausea; right upper belly pain; unusually weak or tired; yellowing of the eyes or skin -unusual vaginal bleeding, discharge -signs and symptoms of a stroke like changes in vision; confusion; trouble speaking or understanding; severe headaches; sudden numbness or weakness of the face, arm or leg; trouble walking; dizziness; loss of balance or coordination Side effects that usually do not require medical attention (report to your doctor or health care professional if they continue or are bothersome): -acne -back pain -breast pain -changes in weight -dizziness -general ill feeling or flu-like symptoms -headache -irregular menstrual bleeding -nausea -sore throat -vaginal irritation or inflammation  This list may not describe all possible side effects. Call your doctor for medical advice about side effects. You may report side effects to FDA at 1-800-FDA-1088. Where should I keep my medicine? This drug is  given in a hospital or clinic and will not be stored at home. NOTE: This sheet is a summary. It may not cover all possible information. If you have questions about this medicine, talk to your doctor, pharmacist, or health care provider.  2018 Elsevier/Gold Standard (2015-10-31 11:19:22)

## 2017-08-12 NOTE — MAU Provider Note (Signed)
History     CSN: [id number]  Arrival date and time: 08/12/17 1054   None     Chief Complaint  Patient presents with  . Vaginal Bleeding  . Vaginal Pain   HPI 27 yo G3P3 here for the evaluation of irregular vaginal bleeding and cramping pelvic pain. Patient reports intermittent vaginal bleeding for the past year. She also reports noting some vaginal bleeding following intercourse. She reports onset of cramping pain this past week. She denies any abnormal discharge. She is sexually active using Nexplanon for contraception. Nexplanon has been in place for the past year  OB History    Gravida  3   Para  3   Term  3   Preterm  0   AB  0   Living  3     SAB  0   TAB  0   Ectopic  0   Multiple  0   Live Births  3           Past Medical History:  Diagnosis Date  . Anxiety   . Bipolar 1 disorder ([place])   . Cognitive deficits   . Depression   . Diabetes mellitus without complication ([place])   . Hypertension   . Mental disorder   . Obesity     Past Surgical History:  Procedure Laterality Date  . CESAREAN SECTION    . CESAREAN SECTION N/A 04/25/2013   Procedure: REPEAT CESAREAN SECTION;  Surgeon: [name], MD;  Location: [facility];  Service: Obstetrics;  Laterality: N/A;  . MASS EXCISION N/A 06/03/2012   Procedure: EXCISION MASS;  Surgeon: [name], MD;  Location: [facility];  Service: ENT;  Laterality: N/A;  Excision uvula mass  . TONSILLECTOMY N/A 06/03/2012   Procedure: TONSILLECTOMY;  Surgeon: [name], MD;  Location: [facility];  Service: ENT;  Laterality: N/A;  . TONSILLECTOMY      Family History  Problem Relation Age of Onset  . Hypertension Mother   . Diabetes Father     Social History   Tobacco Use  . Smoking status: Current Every Day Smoker    Packs/day: 1.00    Years: 11.00    Pack years: 11.00    Types: Cigarettes  . Smokeless tobacco: Never Used  Substance Use Topics  . Alcohol use: No  .  Drug use: No    Comment: Patient denies    Allergies:  Allergies  Allergen Reactions  . Wellbutrin [Bupropion] Shortness Of Breath  . Omnipaque [Iohexol] Swelling and Other (See Comments)    Reaction:  Eye swelling  . Penicillins Hives and Other (See Comments)    Has patient had a PCN reaction causing immediate rash, facial/tongue/throat swelling, SOB or lightheadedness with hypotension: No Has patient had a PCN reaction causing severe rash involving mucus membranes or skin necrosis: No Has patient had a PCN reaction that required hospitalization No Has patient had a PCN reaction occurring within the last 10 years: No If all of the above answers are "NO", then may proceed with Cephalosporin use.    Medications Prior to Admission  Medication Sig Dispense Refill Last Dose  . busPIRone (BUSPAR) 15 MG tablet Take 1 tablet (15 mg total) by mouth 2 (two) times daily. For anxiety (Patient taking differently: Take 15 mg by mouth 3 (three) times daily. For anxiety) 60 tablet 0 08/11/2017 at Unknown time  . DULoxetine (CYMBALTA) 60 MG capsule Take 60 mg by mouth 2 (two) times daily.  08/11/2017 at Unknown time  . fluvoxaMINE (LUVOX) 50 MG tablet Take 100 mg by mouth at bedtime.   0 08/11/2017 at Unknown time  . glipiZIDE (GLUCOTROL XL) 10 MG 24 hr tablet Take 1 tablet (10 mg total) by mouth every evening. For diabetes management (Patient taking differently: Take 10 mg by mouth 2 (two) times daily. For diabetes management) 30 tablet 0 08/11/2017 at Unknown time  . hydrochlorothiazide (HYDRODIURIL) 25 MG tablet Take 1 TABLET BY MOUTH EVERY DAY for 30 days  1 08/11/2017 at Unknown time  . LEVEMIR FLEXTOUCH 100 UNIT/ML Pen Inject 30 units 2 times a day  0 08/11/2017 at Unknown time  . paliperidone (INVEGA SUSTENNA) 234 MG/1.5ML SUSP injection Inject 234 mg every 30 (thirty) days into the muscle.   08/05/2017    Review of Systems  See pertinent in HPI Physical Exam   Blood pressure (!) 136/91, pulse (!)  120, resp. rate 18, height 5\' 7"  (1.702 m), weight 245 lb (111.1 kg), SpO2 98 %.  Physical Exam GENERAL: Well-developed, well-nourished female in no acute distress.  ABDOMEN: Soft, nontender, nondistended. No organomegaly. PELVIC: Normal external female genitalia. Vagina is pink and rugated.  Normal discharge. Normal appearing cervix. Uterus is normal in size. No adnexal mass or tenderness. EXTREMITIES: No cyanosis, clubbing, or edema, 2+ distal pulses.  MAU Course  Procedures  MDM Results for orders placed or performed during the hospital encounter of 08/12/17 (from the past 24 hour(s))  Urinalysis, Routine w reflex microscopic     Status: Abnormal   Collection Time: 08/12/17 11:02 AM  Result Value Ref Range   Color, Urine YELLOW YELLOW   APPearance CLEAR CLEAR   Specific Gravity, Urine 1.044 (H) 1.005 - 1.030   pH 6.0 5.0 - 8.0   Glucose, UA >=500 (A) NEGATIVE mg/dL   Hgb urine dipstick SMALL (A) NEGATIVE   Bilirubin Urine NEGATIVE NEGATIVE   Ketones, ur 5 (A) NEGATIVE mg/dL   Protein, ur >=300 (A) NEGATIVE mg/dL   Nitrite NEGATIVE NEGATIVE   Leukocytes, UA NEGATIVE NEGATIVE   RBC / HPF 0-5 0 - 5 RBC/hpf   WBC, UA 0-5 0 - 5 WBC/hpf   Bacteria, UA NONE SEEN NONE SEEN   Squamous Epithelial / LPF 0-5 (A) NONE SEEN   Mucus PRESENT   Wet prep, genital     Status: Abnormal   Collection Time: 08/12/17 11:45 AM  Result Value Ref Range   Yeast Wet Prep HPF POC NONE SEEN NONE SEEN   Trich, Wet Prep NONE SEEN NONE SEEN   Clue Cells Wet Prep HPF POC NONE SEEN NONE SEEN   WBC, Wet Prep HPF POC FEW (A) NONE SEEN   Sperm NONE SEEN     Assessment and Plan  26 yo G3P3 with irregular bleeding likely associated with Nexplanon - Reassurance provided - Informed patient that irregular bleeding is common with Nexplanon and can be present for the entire length of placement. Advised patient to schedule GYN appointment if bleeding becomes bothersome   Charmon Thorson 08/12/2017, 12:34 PM

## 2017-08-12 NOTE — MAU Note (Signed)
Pt presents to MAU with c/o intermittent vaginal bleeding for 3 years mainly after intercourse. Pt has had vaginal pain and swelling for 6 months.

## 2017-08-13 ENCOUNTER — Other Ambulatory Visit: Payer: Self-pay

## 2017-08-13 ENCOUNTER — Emergency Department (HOSPITAL_COMMUNITY)
Admission: EM | Admit: 2017-08-13 | Discharge: 2017-08-15 | Disposition: A | Discharge status: Home / Self Care | Payer: Medicaid Other | Attending: Emergency Medicine | Admitting: Emergency Medicine

## 2017-08-13 ENCOUNTER — Encounter (HOSPITAL_COMMUNITY): Payer: Self-pay | Admitting: Emergency Medicine

## 2017-08-13 DIAGNOSIS — F329 Major depressive disorder, single episode, unspecified: Secondary | ICD-10-CM | POA: Insufficient documentation

## 2017-08-13 DIAGNOSIS — Z79899 Other long term (current) drug therapy: Secondary | ICD-10-CM | POA: Diagnosis not present

## 2017-08-13 DIAGNOSIS — E1165 Type 2 diabetes mellitus with hyperglycemia: Secondary | ICD-10-CM | POA: Diagnosis not present

## 2017-08-13 DIAGNOSIS — E876 Hypokalemia: Secondary | ICD-10-CM | POA: Diagnosis not present

## 2017-08-13 DIAGNOSIS — I10 Essential (primary) hypertension: Secondary | ICD-10-CM | POA: Diagnosis not present

## 2017-08-13 DIAGNOSIS — R45851 Suicidal ideations: Secondary | ICD-10-CM | POA: Insufficient documentation

## 2017-08-13 DIAGNOSIS — Z794 Long term (current) use of insulin: Secondary | ICD-10-CM | POA: Diagnosis not present

## 2017-08-13 DIAGNOSIS — F17210 Nicotine dependence, cigarettes, uncomplicated: Secondary | ICD-10-CM | POA: Insufficient documentation

## 2017-08-13 DIAGNOSIS — Z046 Encounter for general psychiatric examination, requested by authority: Secondary | ICD-10-CM | POA: Diagnosis not present

## 2017-08-13 LAB — SALICYLATE LEVEL: Salicylate Lvl: <7 mg/dL (ref 2.8–30.0)

## 2017-08-13 LAB — RAPID URINE DRUG SCREEN, HOSP PERFORMED
Amphetamines: NOT DETECTED
Barbiturates: NOT DETECTED
Benzodiazepines: NOT DETECTED
Cocaine: NOT DETECTED
Opiates: NOT DETECTED
Tetrahydrocannabinol: NOT DETECTED

## 2017-08-13 LAB — COMPREHENSIVE METABOLIC PANEL
ALT: 30 U/L (ref 14–54)
AST: 36 U/L (ref 15–41)
Albumin: 4.3 g/dL (ref 3.5–5.0)
Alkaline Phosphatase: 49 U/L (ref 38–126)
Anion gap: 14 (ref 5–15)
BUN: 9 mg/dL (ref 6–20)
CO2: 28 mmol/L (ref 22–32)
Calcium: 9.9 mg/dL (ref 8.9–10.3)
Chloride: 95 mmol/L — ABNORMAL LOW (ref 101–111)
Creatinine, Ser: 0.86 mg/dL (ref 0.44–1.00)
GFR calc Af Amer: >60 mL/min (ref >60)
GFR calc non Af Amer: >60 mL/min (ref >60)
Glucose, Bld: 382 mg/dL — ABNORMAL HIGH (ref 65–99)
Potassium: 2.9 mmol/L — ABNORMAL LOW (ref 3.5–5.1)
Sodium: 137 mmol/L (ref 135–145)
Total Bilirubin: 0.7 mg/dL (ref 0.3–1.2)
Total Protein: 7.6 g/dL (ref 6.5–8.1)

## 2017-08-13 LAB — GC/CHLAMYDIA PROBE AMP (~~LOC~~) NOT AT ARMC
CHLAMYDIA, DNA PROBE: NEGATIVE
Neisseria Gonorrhea: NEGATIVE

## 2017-08-13 LAB — CBC
HCT: 40.8 % (ref 36.0–46.0)
Hemoglobin: 13.6 g/dL (ref 12.0–15.0)
MCH: 26.9 pg (ref 26.0–34.0)
MCHC: 33.3 g/dL (ref 30.0–36.0)
MCV: 80.8 fL (ref 78.0–100.0)
PLATELETS: 221 10*3/uL (ref 150–400)
RBC: 5.05 MIL/uL (ref 3.87–5.11)
RDW: 14.4 % (ref 11.5–15.5)
WBC: 7.6 10*3/uL (ref 4.0–10.5)

## 2017-08-13 LAB — ETHANOL: Alcohol, Ethyl (B): <10 mg/dL (ref <10)

## 2017-08-13 LAB — CBG MONITORING, ED
GLUCOSE-CAPILLARY: 297 mg/dL — AB (ref 65–99)
Glucose-Capillary: 406 mg/dL — ABNORMAL HIGH (ref 65–99)
Glucose-Capillary: 368 mg/dL — ABNORMAL HIGH (ref 65–99)

## 2017-08-13 LAB — ACETAMINOPHEN LEVEL: Acetaminophen (Tylenol), Serum: <10 ug/mL — ABNORMAL LOW (ref 10–30)

## 2017-08-13 MED ORDER — POTASSIUM CHLORIDE CRYS ER 20 MEQ PO TBCR
60.0000 meq | EXTENDED_RELEASE_TABLET | Freq: Once | ORAL | Status: AC
  Administered 2017-08-13: 60 meq via ORAL
  Filled 2017-08-13: qty 3

## 2017-08-13 MED ORDER — INSULIN DETEMIR 100 UNIT/ML FLEXPEN: 30.0000 [IU] | PEN_INJECTOR | Freq: Every day | SUBCUTANEOUS | Status: DC

## 2017-08-13 MED ORDER — SIMVASTATIN 20 MG PO TABS
20.0000 mg | ORAL_TABLET | Freq: Every day | ORAL | Status: DC
  Administered 2017-08-13 – 2017-08-14 (×2): 20 mg via ORAL
  Filled 2017-08-13 (×2): qty 1

## 2017-08-13 MED ORDER — FLUVOXAMINE MALEATE 50 MG PO TABS
100.0000 mg | ORAL_TABLET | Freq: Two times a day (BID) | ORAL | Status: DC
  Administered 2017-08-13 – 2017-08-15 (×4): 100 mg via ORAL
  Filled 2017-08-13 (×4): qty 2

## 2017-08-13 MED ORDER — INSULIN DETEMIR 100 UNIT/ML ~~LOC~~ SOLN
30.0000 [IU] | Freq: Every day | SUBCUTANEOUS | Status: DC
  Administered 2017-08-13: 30 [IU] via SUBCUTANEOUS
  Filled 2017-08-13 (×2): qty 0.3

## 2017-08-13 MED ORDER — BUSPIRONE HCL 10 MG PO TABS
15.0000 mg | ORAL_TABLET | Freq: Two times a day (BID) | ORAL | Status: DC
  Administered 2017-08-13 – 2017-08-15 (×4): 15 mg via ORAL
  Filled 2017-08-13 (×4): qty 2

## 2017-08-13 MED ORDER — GLIPIZIDE ER 10 MG PO TB24
10.0000 mg | ORAL_TABLET | Freq: Every evening | ORAL | Status: DC
  Administered 2017-08-13 – 2017-08-14 (×2): 10 mg via ORAL
  Filled 2017-08-13 (×3): qty 1

## 2017-08-13 MED ORDER — HYDROCHLOROTHIAZIDE 25 MG PO TABS
25.0000 mg | ORAL_TABLET | Freq: Every day | ORAL | Status: DC
  Administered 2017-08-14 – 2017-08-15 (×2): 25 mg via ORAL
  Filled 2017-08-13 (×3): qty 1

## 2017-08-13 MED ORDER — DULOXETINE HCL 60 MG PO CPEP
60.0000 mg | ORAL_CAPSULE | Freq: Two times a day (BID) | ORAL | Status: DC
  Administered 2017-08-13 – 2017-08-15 (×4): 60 mg via ORAL
  Filled 2017-08-13 (×4): qty 1

## 2017-08-13 MED ORDER — INSULIN ASPART 100 UNIT/ML ~~LOC~~ SOLN
0.0000 [IU] | Freq: Three times a day (TID) | SUBCUTANEOUS | Status: DC
  Administered 2017-08-14: 11 [IU] via SUBCUTANEOUS
  Administered 2017-08-14 – 2017-08-15 (×4): 7 [IU] via SUBCUTANEOUS
  Filled 2017-08-13 (×4): qty 1

## 2017-08-13 NOTE — ED Provider Notes (Addendum)
[facility] EMERGENCY DEPARTMENT Provider Note   CSN: [id number] Arrival date & time: 08/13/17  1203     History   Chief Complaint Chief Complaint  Patient presents with  . Suicidal  . Hyperglycemia  . Leg Pain    HPI [name] is a 27 y.o. female with past medical history of bipolar disorder, diabetes, hypertension, who presents to ED for suicidal ideations.  Plan is wanting to overdose on her medications because she lives by herself and does not think that she can handle all of her health issues.  She would like to be placed into a group home.  She reports intermittent compliance with her home insulin and other medications.  Denies any homicidal ideations, AVH, medication over dosages, vomiting.  HPI  Past Medical History:  Diagnosis Date  . Anxiety   . Bipolar 1 disorder ([place])   . Cognitive deficits   . Depression   . Diabetes mellitus without complication ([place])   . Hypertension   . Mental disorder   . Obesity     Patient Active Problem List   Diagnosis Date Noted  . 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 LGSIL on Pap smear of cervix 02/01/2017  . OCD (obsessive compulsive disorder) 10/05/2016  . Major depressive disorder, recurrent episode, mild ([place]) 05/04/2016  . 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 Learning disability 07/18/2015  . 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 Diabetes mellitus ([place]) 07/18/2015  . 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 Hyperlipidemia 07/18/2015  . Severe episode of recurrent major depressive disorder, without psychotic features ([place])   . Drug overdose   . Cognitive deficits 10/12/2012  . Generalized anxiety disorder 06/28/2012    Past Surgical History:  Procedure Laterality Date  . CESAREAN SECTION    . CESAREAN SECTION N/A 04/25/2013   Procedure: REPEAT CESAREAN SECTION;  Surgeon: [name], MD;   Location: [facility];  Service: Obstetrics;  Laterality: N/A;  . MASS EXCISION N/A 06/03/2012   Procedure: EXCISION MASS;  Surgeon: [name], MD;  Location: [facility];  Service: ENT;  Laterality: N/A;  Excision uvula mass  . TONSILLECTOMY N/A 06/03/2012   Procedure: TONSILLECTOMY;  Surgeon: [name], MD;  Location: [facility];  Service: ENT;  Laterality: N/A;  . TONSILLECTOMY       OB History    Gravida  3   Para  3   Term  3   Preterm  0   AB  0   Living  3     SAB  0   TAB  0   Ectopic  0   Multiple  0   Live Births  3            Home Medications    Prior to Admission medications   Medication Sig Start Date End Date Taking? Authorizing Provider  busPIRone (BUSPAR) 15 MG tablet Take 1 tablet (15 mg total) by mouth 2 (two) times daily. For anxiety Patient taking differently: Take 15 mg by mouth 3 (three) times daily. For anxiety 10/06/16  Yes Nwoko, Herbert Pun I, NP  DULoxetine (CYMBALTA) 60 MG capsule Take 60 mg by mouth 2 (two) times daily.    Yes [provider]  fluvoxaMINE (LUVOX) 50 MG tablet Take 100 mg by mouth 2 (two) times daily.  02/02/17  Yes [provider]  glipiZIDE (GLUCOTROL XL) 10 MG 24 hr tablet Take 1  tablet (10 mg total) by mouth every evening. For diabetes management Patient taking differently: Take 10 mg by mouth 2 (two) times daily. For diabetes management 10/06/16  Yes Lindell Spar I, NP  hydrochlorothiazide (HYDRODIURIL) 25 MG tablet Take 1 TABLET BY MOUTH EVERY DAY for 30 days 02/15/17  Yes [provider]  LEVEMIR FLEXTOUCH 100 UNIT/ML Pen Inject 30 units 2 times a day 02/10/17  Yes [provider]  paliperidone (INVEGA SUSTENNA) 234 MG/1.5ML SUSP injection Inject 234 mg every 30 (thirty) days into the muscle.   Yes [provider]  simvastatin (ZOCOR) 20 MG tablet Take 20 mg by mouth at bedtime. 07/16/17  Yes [provider]    Family History Family  History  Problem Relation Age of Onset  . Hypertension Mother   . Diabetes Father     Social History Social History   Tobacco Use  . Smoking status: Current Every Day Smoker    Packs/day: 1.00    Years: 11.00    Pack years: 11.00    Types: Cigarettes  . Smokeless tobacco: Never Used  Substance Use Topics  . Alcohol use: No  . Drug use: No    Comment: Patient denies     Allergies   Wellbutrin [bupropion]; Omnipaque [iohexol]; and Penicillins   Review of Systems Review of Systems  Constitutional: Negative for appetite change, chills and fever.  HENT: Negative for ear pain, rhinorrhea, sneezing and sore throat.   Eyes: Negative for photophobia and visual disturbance.  Respiratory: Negative for cough, chest tightness, shortness of breath and wheezing.   Cardiovascular: Negative for chest pain and palpitations.  Gastrointestinal: Negative for abdominal pain, blood in stool, constipation, diarrhea, nausea and vomiting.  Genitourinary: Negative for dysuria, hematuria and urgency.  Musculoskeletal: Negative for myalgias.  Skin: Negative for rash.  Neurological: Negative for dizziness, weakness and light-headedness.  Psychiatric/Behavioral: Positive for suicidal ideas. Negative for agitation, confusion and dysphoric mood.     Physical Exam Updated Vital Signs BP (!) 143/88   Pulse (!) 101   Temp 98.2 F (36.8 C) (Oral)   Resp 16   Ht 5\' 7"  (1.702 m)   Wt 111.1 kg (245 lb)   SpO2 96%   BMI 38.37 kg/m   Physical Exam  Constitutional: She appears well-developed and well-nourished. No distress.  HENT:  Head: Normocephalic and atraumatic.  Nose: Nose normal.  Eyes: Conjunctivae and EOM are normal. Right eye exhibits no discharge. Left eye exhibits no discharge. No scleral icterus.  Neck: Normal range of motion. Neck supple.  Cardiovascular: Normal rate, regular rhythm, normal heart sounds and intact distal pulses. Exam reveals no gallop and no friction rub.  No  murmur heard. Pulmonary/Chest: Effort normal and breath sounds normal. No respiratory distress.  Abdominal: Soft. Bowel sounds are normal. She exhibits no distension. There is no tenderness. There is no guarding.  Musculoskeletal: Normal range of motion. She exhibits no edema.  Neurological: She is alert. She exhibits normal muscle tone. Coordination normal.  Skin: Skin is warm and dry. No rash noted.  Psychiatric: She has a normal mood and affect. She expresses suicidal ideation. She expresses suicidal plans.  Nursing note and vitals reviewed.    ED Treatments / Results  Labs (all labs ordered are listed, but only abnormal results are displayed) Labs Reviewed  COMPREHENSIVE METABOLIC PANEL - Abnormal; Notable for the following components:      Result Value   Potassium 2.9 (*)    Chloride 95 (*)    Glucose,  Bld 382 (*)    All other components within normal limits  ACETAMINOPHEN LEVEL - Abnormal; Notable for the following components:   Acetaminophen (Tylenol), Serum <10 (*)    All other components within normal limits  CBG MONITORING, ED - Abnormal; Notable for the following components:   Glucose-Capillary 406 (*)    All other components within normal limits  CBG MONITORING, ED - Abnormal; Notable for the following components:   Glucose-Capillary 368 (*)    All other components within normal limits  ETHANOL  SALICYLATE LEVEL  CBC  RAPID URINE DRUG SCREEN, HOSP PERFORMED    EKG None  Radiology No results found.  Procedures Procedures (including critical care time)  Medications Ordered in ED Medications  busPIRone (BUSPAR) tablet 15 mg (has no administration in time range)  DULoxetine (CYMBALTA) DR capsule 60 mg (has no administration in time range)  fluvoxaMINE (LUVOX) tablet 100 mg (has no administration in time range)  glipiZIDE (GLUCOTROL XL) 24 hr tablet 10 mg (has no administration in time range)  simvastatin (ZOCOR) tablet 20 mg (has no administration in time  range)  hydrochlorothiazide (HYDRODIURIL) tablet 25 mg (has no administration in time range)  insulin detemir (LEVEMIR) injection 30 Units (has no administration in time range)  insulin aspart (novoLOG) injection 0-20 Units (has no administration in time range)  potassium chloride SA (K-DUR,KLOR-CON) CR tablet 60 mEq (60 mEq Oral Given 08/13/17 1830)     Initial Impression / Assessment and Plan / ED Course  I have reviewed the triage vital signs and the nursing notes.  Pertinent labs & imaging results that were available during my care of the patient were reviewed by me and considered in my medical decision making (see chart for details).     Patient presents to ED for evaluation of suicidal ideations with plan of wanting to overdose on her medications.  Patient is concerned about living by herself and would like placement in a group home.  She denies any HI, AVH.  Her CBG on arrival was 406.  She reports intermittent compliance with her home insulin.  Patient will be given p.o. potassium for hypokalemia at 2.9 which is slightly lower than her baseline.  Remainder of medical screening lab work is unremarkable.  She is medically cleared for TTS evaluation.   TTS recommends inpatient management.  They will seek placement once patient's blood sugars are less than 350 for 24 hours.  Portions of this note were generated with [employer]. Dictation errors may occur despite best attempts at proofreading.   Final Clinical Impressions(s) / ED Diagnoses   Final diagnoses:  None    ED Discharge Orders    None        Delia Heady, PA-C 08/13/17 2036    Margette Fast, MD 08/14/17 [phone]

## 2017-08-13 NOTE — ED Notes (Signed)
Patient has a jacket, sneakers, bra, pants, and shirt placed in locker #2; Pt also has Pink cell phone, keys, ID, EBT card, lighter, and $32.06 (witnessed count with Sitter) placed with SS; Pt also has HTCZ 25mg , Duloxetine HCL x 2 bottles, Buspirone, Glipizide ER x 2 bottles, and Fluvoxamine 100mg  to be sent to [facility]

## 2017-08-13 NOTE — BH Assessment (Addendum)
Tele Assessment Note   Patient Name: [name] MRN: [id number] Referring Physician: Delia Heady, PA_C Location of Patient: [name], [phone] Location of Provider: [place] is an 27 y.o.  single female who presents unaccompanied to [name] reporting symptoms of depression including suicidal ideation. Pt has an extensive mental health history and has presented to emergency departments 22 times in the past six months. She report suicidal ideation with plan to overdose on prescription medications and says, "I will die tonight."  She reports a history of four previous suicide attempts by overdose. Pt acknowledges symptoms including crying spells, social withdrawal, loss of interest in usual pleasures, fatigue, irritability, decreased concentration, decreased sleep and feelings of guilt and hopelessness. Pt acknowledges feeling lonely. She denies a history of intentional self-injurious behavior. She denies current homicidal ideation or history of violence. She denies any psychotic symptoms. She denies alcohol or substance use; urine drug screen and alcohol level are negative.  Pt identifies several stressors. She says she cannot stay alone in her apartment and that she walks around outside day and night because she feels restless. She says she cannot manage her diabetes without assistance. Pt says, "My diabetes is out of control." She wants to live in a group home so she will be around other people and have help managing her diabetes. She says she has conflicts with her mother and Pt reports "she says mean things to me." Pt cannot identify anyone in her life who is supportive. She reports she has biological daughters who "are in adoption" (ages 35, 87 and 3). Pt says she is unemployed and receiving disability income. Pt denies history of abuse or trauma.   Pt is currently receiving outpatient medication management through [facility] and says she has an  appointment next week. She says she doesn't feel her psychiatrist listens to her and doesn't believe her psychiatric medications are effective. She says during assessment she take her medications as prescribed but she reported to EDP intermittent compliance with insulin and other medications. Pt reports she was treated inpatient at [facility] approximately six weeks ago. She reports she has been psychiatrically hospitalized at facilities including [facility], [facility], [place], and [place].  Pt is dressed in hospital scrubs, alert and oriented x4. Pt speaks in a clear tone, at moderate volume and normal pace. Motor behavior appears normal. Eye contact is good. Pt's mood is depressed and affect is congruent with mood. Thought process is coherent and relevant. There is no indication Pt is currently responding to internal stimuli or experiencing delusional thought content. Pt was cooperative throughout assessment. She says she will sign voluntarily into a psychiatric facility.   Diagnosis: F33.2 Major Depressive Disorder, Recurrent, Severe  Past Medical History:  Past Medical History:  Diagnosis Date  . Anxiety   . Bipolar 1 disorder ([place])   . Cognitive deficits   . Depression   . Diabetes mellitus without complication ([place])   . Hypertension   . Mental disorder   . Obesity     Past Surgical History:  Procedure Laterality Date  . CESAREAN SECTION    . CESAREAN SECTION N/A 04/25/2013   Procedure: REPEAT CESAREAN SECTION;  Surgeon: [name], MD;  Location: [facility];  Service: Obstetrics;  Laterality: N/A;  . MASS EXCISION N/A 06/03/2012   Procedure: EXCISION MASS;  Surgeon: [name], MD;  Location: [place];  Service: ENT;  Laterality: N/A;  Excision uvula mass  . TONSILLECTOMY N/A 06/03/2012   Procedure: TONSILLECTOMY;  Surgeon: [name], MD;  Location: [facility];  Service: ENT;  Laterality: N/A;  . TONSILLECTOMY       Family History:  Family History  Problem Relation Age of Onset  . Hypertension Mother   . Diabetes Father     Social History:  reports that she has been smoking cigarettes.  She has a 11.00 pack-year smoking history. She has never used smokeless tobacco. She reports that she does not drink alcohol or use drugs.  Additional Social History:  Alcohol / Drug Use Pain Medications: See PTA medication list Prescriptions: See PTA medication list Over the Counter: See PTA medication list History of alcohol / drug use?: No history of alcohol / drug abuse Longest period of sobriety (when/how long): NA  CIWA: CIWA-Ar BP: (!) 143/88 Pulse Rate: (!) 101 COWS:    Allergies:  Allergies  Allergen Reactions  . Wellbutrin [Bupropion] Shortness Of Breath  . Omnipaque [Iohexol] Swelling and Other (See Comments)    Reaction:  Eye swelling  . Penicillins Hives and Other (See Comments)    Has patient had a PCN reaction causing immediate rash, facial/tongue/throat swelling, SOB or lightheadedness with hypotension: No Has patient had a PCN reaction causing severe rash involving mucus membranes or skin necrosis: No Has patient had a PCN reaction that required hospitalization No Has patient had a PCN reaction occurring within the last 10 years: No If all of the above answers are "NO", then may proceed with Cephalosporin use.    Home Medications:  (Not in a hospital admission)  OB/GYN Status:  No LMP recorded. Patient has had an implant.  General Assessment Data Location of Assessment: [facility] ED TTS Assessment: In system Is this a Tele or Face-to-Face Assessment?: Tele Assessment Is this an Initial Assessment or a Re-assessment for this encounter?: Initial Assessment Marital status: Single Maiden name: NA Is patient pregnant?: No Pregnancy Status: No Living Arrangements: Alone Can pt return to current living arrangement?: Yes Admission Status: Voluntary Is patient capable of signing  voluntary admission?: Yes Referral Source: Self/Family/Friend Insurance type: Medicaid     Crisis Care Plan Living Arrangements: Alone Legal Guardian: Other:(Self) Name of Psychiatrist: [name] Name of Therapist: [facility]  Education Status Is patient currently in school?: No Highest grade of school patient has completed: 11th grade. Is the patient employed, unemployed or receiving disability?: Receiving disability income  Risk to self with the past 6 months Suicidal Ideation: Yes-Currently Present Has patient been a risk to self within the past 6 months prior to admission? : Yes Suicidal Intent: Yes-Currently Present Has patient had any suicidal intent within the past 6 months prior to admission? : Yes Is patient at risk for suicide?: Yes Suicidal Plan?: Yes-Currently Present Has patient had any suicidal plan within the past 6 months prior to admission? : Yes Specify Current Suicidal Plan: Overdose on medication Access to Means: Yes Specify Access to Suicidal Means: Access to prescription medications What has been your use of drugs/alcohol within the last 12 months?: Pt denies Previous Attempts/Gestures: Yes How many times?: 4(by overdose) Other Self Harm Risks: None Triggers for Past Attempts: Unpredictable Intentional Self Injurious Behavior: None Family Suicide History: No Recent stressful life event(s): Conflict (Comment)(Conflict with mother) Persecutory voices/beliefs?: No Depression: Yes Depression Symptoms: Despondent, Insomnia, Tearfulness, Fatigue, Guilt, Loss of interest in usual pleasures, Feeling worthless/self pity, Feeling angry/irritable Substance abuse history and/or treatment for substance abuse?: No Suicide prevention information given  to non-admitted patients: Not applicable  Risk to Others within the past 6 months Homicidal Ideation: No Does patient have any lifetime risk of violence toward others beyond the six months prior to admission? : No Thoughts  of Harm to Others: No Current Homicidal Intent: No Current Homicidal Plan: No Access to Homicidal Means: No Identified Victim: None History of harm to others?: No Assessment of Violence: None Noted Violent Behavior Description: Pt denies history of violence Does patient have access to weapons?: No Criminal Charges Pending?: No Does patient have a court date: No Is patient on probation?: No  Psychosis Hallucinations: None noted Delusions: None noted  Mental Status Report Appearance/Hygiene: In scrubs, Unremarkable Eye Contact: Good Motor Activity: Unremarkable Speech: Logical/coherent Level of Consciousness: Alert Mood: Depressed Affect: Depressed, Anxious Anxiety Level: Minimal Thought Processes: Coherent, Relevant Judgement: Partial Orientation: Person, Place, Time, Situation, Appropriate for developmental age Obsessive Compulsive Thoughts/Behaviors: None  Cognitive Functioning Concentration: Normal Memory: Recent Intact, Remote Intact Is patient IDD: No Level of Function: na Is patient DD?: No I IQ score available?: No Insight: Poor Impulse Control: Fair Appetite: Good Have you had any weight changes? : No Change Amount of the weight change? (lbs): 0 lbs Sleep: Decreased Total Hours of Sleep: 4 Vegetative Symptoms: None  ADLScreening [facility] Assessment Services) Patient's cognitive ability adequate to safely complete daily activities?: Yes Patient able to express need for assistance with ADLs?: Yes Independently performs ADLs?: Yes (appropriate for developmental age)  Prior Inpatient Therapy Prior Inpatient Therapy: Yes Prior Therapy Dates: 05/2017; multiple admits Prior Therapy Facilty/Provider(s): [facility], [place] Reason for Treatment: Suicide attempt, depression  Prior Outpatient Therapy Prior Outpatient Therapy: Yes Prior Therapy Dates: Current Prior Therapy Facilty/Provider(s): [facility]. Reason for Treatment: Medication management and counseling.   Does patient have an ACCT team?: No Does patient have Intensive In-House Services?  : No Does patient have [facility] services? : Yes Does patient have P4CC services?: No  ADL Screening (condition at time of admission) Patient's cognitive ability adequate to safely complete daily activities?: Yes Is the patient deaf or have difficulty hearing?: No Does the patient have difficulty seeing, even when wearing glasses/contacts?: No Does the patient have difficulty concentrating, remembering, or making decisions?: Yes Patient able to express need for assistance with ADLs?: Yes Does the patient have difficulty dressing or bathing?: No Independently performs ADLs?: Yes (appropriate for developmental age) Does the patient have difficulty walking or climbing stairs?: No Weakness of Legs: None Weakness of Arms/Hands: None  Home Assistive Devices/Equipment Home Assistive Devices/Equipment: CBG Meter    Abuse/Neglect Assessment (Assessment to be complete while patient is alone) Abuse/Neglect Assessment Can Be Completed: Yes Physical Abuse: Denies Verbal Abuse: Denies Sexual Abuse: Denies Exploitation of patient/patient's resources: Denies Self-Neglect: Denies     Regulatory affairs officer (For Healthcare) Does Patient Have a Medical Advance Directive?: No Would patient like information on creating a medical advance directive?: No - Patient declined          Disposition: Gave clinical report to Lindon Romp, NP who said Pt meets criteria for inpatient psychiatric treatment. [name], [facility], said Pt's glucose level needs to be below 350 for 24 hours before Pt can be admitted to [facility]. TTS will contact facilities for placement when Pt's glucose level in lower. Notified Delia Heady, PA-C and Roma, RN of recommendation.  Disposition Initial Assessment Completed for this Encounter: Yes  This service was provided via telemedicine using a 2-way, interactive audio and video  technology.  Names of  all persons participating in this telemedicine service and their role in this encounter. Name: [name] Role: Patient  Name: Storm Frisk, [place] Role: TTS counselor         Orpah Greek Anson Fret, [facility], [facility], [facility] Triage Specialist [phone]  Evelena Peat 08/13/2017 7:41 PM

## 2017-08-13 NOTE — ED Notes (Signed)
Carb Modified Diet ordered for Dinner. 

## 2017-08-13 NOTE — ED Triage Notes (Signed)
Pt. Stated, Im suicidal, I just know Im going to die today. My sugar is up and my legs are hurting.  My Potassium might be up.

## 2017-08-13 NOTE — ED Notes (Signed)
TTS in process-Monique,RN  

## 2017-08-14 ENCOUNTER — Encounter (HOSPITAL_COMMUNITY): Payer: Self-pay | Admitting: Registered Nurse

## 2017-08-14 ENCOUNTER — Other Ambulatory Visit: Payer: Self-pay

## 2017-08-14 LAB — CBG MONITORING, ED
GLUCOSE-CAPILLARY: 233 mg/dL — AB (ref 65–99)
Glucose-Capillary: 272 mg/dL — ABNORMAL HIGH (ref 65–99)
Glucose-Capillary: 227 mg/dL — ABNORMAL HIGH (ref 65–99)

## 2017-08-14 MED ORDER — NICOTINE 21 MG/24HR TD PT24: 21.0000 mg | MEDICATED_PATCH | Freq: Once | TRANSDERMAL | Status: DC

## 2017-08-14 NOTE — ED Notes (Signed)
Pt aware of tx plan - may be accepted to [facility] if able to maintain CBG less than 350.

## 2017-08-14 NOTE — ED Notes (Signed)
Per [facility], [facility] - pt is not going to [facility].

## 2017-08-14 NOTE — ED Notes (Signed)
Pt requested [place] sandwich and sprite zero for snack. Pt given the same plus apple sauce and sugar free short bread cookies

## 2017-08-14 NOTE — ED Notes (Addendum)
Pt yelling loudly stating "Shut up, [employer]!" States she is talking to herself. Pt ambulatory to bathroom and then to nurses' desk. Pt hit her fist on desk and in a calm voice asked "Have they found me something yet?" Advised pt [facility] is seeking placement and as soon as this RN is advised of acceptance, will let pt know. Pt asking if she can go to [employer] "because they helped me". Advised pt [facility] is seeking multiple facilities. Voiced understanding and returned to room.

## 2017-08-14 NOTE — ED Notes (Signed)
Pt aware of tx plan - will stay overnight and have Telepsych performed in am. Voiced agreement w/tx plan. Pt on phone asking the person if they think she should stay and be admitted or ask to leave tomorrow.

## 2017-08-14 NOTE — ED Notes (Signed)
Pt stated she no longer needed Nicotine Patch. Pt asked for RN to let [facility] know she wants to leave. States "there's no need for me just to sit up in here" - denies SI/HI. Arby Barrette, [facility], aware.

## 2017-08-14 NOTE — ED Notes (Signed)
Staffing Office and Roselyn Reef, Agricultural consultant, aware of no Actuary available for pt. Pt being monitored by staff.

## 2017-08-14 NOTE — ED Notes (Signed)
Pt ambulatory to desk asking for Nicotine gum - advised pt may have patch. Pt stated "I need the gum". Offered pt the patch - states "I'll take it". Pt ambulatory desk x 2 - stated "I might as well go home then rather than sit up in here". Advised pt RN will discuss w/[facility] Counselor. Voiced understanding and returned to room.

## 2017-08-14 NOTE — ED Notes (Signed)
Re-TTS completed.  

## 2017-08-14 NOTE — ED Notes (Signed)
States she does not want Nicotine Patch at this time. States she spoke w/her mother on the phone, who "always has an attitude w/me" and she feels she may need to go Inpt are per her mother. Recommended for pt to get some rest tonight and think about her decision.

## 2017-08-14 NOTE — BH Assessment (Addendum)
1354 Patient seen again by writer and [name] NP. Pt now reports she isn't suicidal. She says, "I'm feeling better." Pt says she spoke with her mother who encouraged her to buy a TV antenna. She says she doesn't have a way to watch TV which typically distracts her from her suicidal thoughts. [name] NP recommends pt staying in [facility] overnight and to be reassessed in am for likely discharge.   [name] Long, [place] Therapeutic Triage Specialist   [phone] Patient is cooperative during reassessment. Her affect is flat. She continues to endorse suicidal ideation and reports she will overdose on her psych meds. She reports she slept well, and her appetite is fair. Patient denies homicidal ideation. She denies any type of hallucinations and no delusions noted.  TC to [employer] who is on pt's [employer] (crisis phone [phone]. He says he is working on getting a Emergency planning/management officer to check on pt's diabetes. He reports she receives her meds from [place]. He says that pt used to live with her mother and patient is having a difficult time living on her own. He says patients wants to go to a group home. Grandville Silos reports going to a group home is something they can purse.  He asks that prior to pt's discharge, PSI be contacted and that the discharge summary is sent to [facility].  [name] Long, [place] Therapeutic Triage Specialist

## 2017-08-14 NOTE — ED Notes (Signed)
Per [name] SW, pt is to have Telepsych in am and may possibly be d/c'd to home.

## 2017-08-14 NOTE — ED Notes (Signed)
Obtained lunch order request from 2 options received.

## 2017-08-14 NOTE — ED Notes (Signed)
Pt asked RN "Do you know how long I'm going to be here?" Advised pt [facility] is seeking placement. Pt states "This is bullshit - having to sit here and wait". RN allowed pt to vent feelings/concerns.

## 2017-08-14 NOTE — ED Notes (Signed)
Pt states "I have already had insulin three times today". Pt states that she does not want to take her insulin. Told pt that this is her nightly insulin. "I really don't want it".

## 2017-08-14 NOTE — ED Notes (Signed)
Patient denies pain and is resting comfortably.  

## 2017-08-14 NOTE — ED Notes (Signed)
Snack and drink given to patient. Pt cooperative and pleasant.

## 2017-08-14 NOTE — ED Notes (Signed)
Snack given as requested.  

## 2017-08-14 NOTE — ED Notes (Signed)
Pt on phone at nurses' desk. 

## 2017-08-14 NOTE — ED Notes (Signed)
Re-TTS and Telepsych completed.

## 2017-08-15 ENCOUNTER — Encounter (HOSPITAL_COMMUNITY): Payer: Self-pay | Admitting: Registered Nurse

## 2017-08-15 LAB — CBG MONITORING, ED
Glucose-Capillary: 238 mg/dL — ABNORMAL HIGH (ref 65–99)
Glucose-Capillary: 239 mg/dL — ABNORMAL HIGH (ref 65–99)

## 2017-08-15 NOTE — ED Notes (Signed)
Pt sleeping NAD. Will continue to monitor

## 2017-08-15 NOTE — BH Assessment (Signed)
Stacy Rankin NP recommends inpatient treatment. Patient referred to the following hospitals for inpatient treatment: [place], [facility], [facility], [place], [place], [place], [place].

## 2017-08-15 NOTE — ED Provider Notes (Signed)
1:50 PM  Nursing called to report that patient is now been cleared from a psychiatric standpoint and from a medical standpoint.  They feel patient safe to follow-up with her act team and her psychiatry team.  Chart review shows this documentation supporting these facts.  Patient was assessed by me and had no complaints.  Patient appeared well and agreed with discharge home.  She reported understanding of follow-up instructions and return precautions.    Patient had no other questions or concerns and was discharged in good condition.    Clinical Impression: 1. Suicidal ideation     Disposition: Discharge  Condition: Good  I have discussed the results, Dx and Tx plan with the pt(& family if present). He/she/they expressed understanding and agree(s) with the plan. Discharge instructions discussed at great length. Strict return precautions discussed and pt &/or family have verbalized understanding of the instructions. No further questions at time of discharge.    New Prescriptions   No medications on file    Follow Up: [facility] [address] [id number] mc [place]       Tegeler, Gwenyth Allegra, MD 08/15/17 1357

## 2017-08-15 NOTE — ED Notes (Signed)
Offered shower - declined at this time.

## 2017-08-15 NOTE — ED Notes (Signed)
Offered to shower - declined.

## 2017-08-15 NOTE — Discharge Instructions (Signed)
Please follow up with your behavioral health team. If any symptoms return or worsen, please return to the nearest ED.

## 2017-08-15 NOTE — ED Notes (Signed)
Breakfast tray ordered 

## 2017-08-15 NOTE — Consult Note (Signed)
  Tele Assessment   [name], 27 y.o., female patient presented to [facility] with complaints of suicidal ideation.  Patient seen via telepsych by this provider; chart reviewed and consulted with Dr. Dwyane Dee on 08/15/17.  On evaluation [name] reports that she was brought to the hospital because she was having some suicidal thoughts because she was bored.  Today patient states that she is feeling much better today.  Patient denies suicidal/self-harm/homicidal ideations, psychosis, and paranoia.  Patient reports that she has been good and has not had any outburst since she has been in the ED.  Patient also states that she has been eating okay and sleeping well.  During evaluation [name] is alert/oriented x 4; calm/cooperative with pleasant affect.  She does not appear to be responding to internal/external stimuli.  Patient denies suicidal/self-harm/homicidal ideation, psychosis, and paranoia.  Patient answered question appropriately.  Recommendations:  follow-up with current outpatient psychiatric provider  Disposition: No evidence of imminent risk to self or others at present.   Patient does not meet criteria for psychiatric inpatient admission.   [name], NP

## 2017-08-15 NOTE — BH Assessment (Signed)
Patient evaluated by [name], NP and discharge home was recommended. EDP (Dr. Debbora Dus) made aware of the discharge recommendations and agrees. Also, updated patient's nurse Jacqlyn Larsen) of the discharge recommendations.   Per prior TTS documentation, Patient's [facility] Community Support worker asked to be notified of patient's discharge. Probation officer contacted the worker, [employer]. He further asked for the discharge summary to be faxed but not know the fax number. Writer unable to fax this documentation without a fax number.

## 2017-08-15 NOTE — ED Notes (Signed)
Pt called her mother and asked if she can come stay w/her - advised her mother said yes.

## 2017-08-24 ENCOUNTER — Encounter (HOSPITAL_COMMUNITY): Payer: Self-pay | Admitting: Emergency Medicine

## 2017-08-24 ENCOUNTER — Ambulatory Visit (HOSPITAL_COMMUNITY)
Admission: EM | Admit: 2017-08-24 | Discharge: 2017-08-24 | Disposition: A | Discharge status: Home / Self Care | Payer: Medicaid Other | Attending: Family Medicine | Admitting: Family Medicine

## 2017-08-24 DIAGNOSIS — IMO0002 Reserved for concepts with insufficient information to code with codable children: Secondary | ICD-10-CM

## 2017-08-24 DIAGNOSIS — R252 Cramp and spasm: Secondary | ICD-10-CM | POA: Diagnosis not present

## 2017-08-24 DIAGNOSIS — E1165 Type 2 diabetes mellitus with hyperglycemia: Secondary | ICD-10-CM

## 2017-08-24 DIAGNOSIS — Z8639 Personal history of other endocrine, nutritional and metabolic disease: Secondary | ICD-10-CM

## 2017-08-24 DIAGNOSIS — Z794 Long term (current) use of insulin: Secondary | ICD-10-CM

## 2017-08-24 NOTE — ED Provider Notes (Signed)
[place]    CSN: [id number] Arrival date & time: 08/24/17  1624     History   Chief Complaint Chief Complaint  Patient presents with  . cramping    HPI [name] is a 27 y.o. female.   27 year old female presents with intermittent bilateral lower leg muscle cramps for the past 2 to 3 days. Occasionally will occur in her feet and upper arms. Uncertain if cramping is due to history of lower levels of potassium. Denies any fever, redness, swelling in lower legs or feet. No distinct sores or ulcers. She does have a history of diabetes and currently on Glucotrol and Levemir. Also has history of HTN and smoking. Last visit to the ER on 08/13/17 showed lab levels of K = 2.9,  glucose = 382 with normal kidney function. Notes indicated that she should start on oral potassium and one dose given in the hospital but no outpatient order written. Visit was mainly addressing behavioral health and suicidal ideations. She has a history of bipolar depression, schizophrenia, and anxiety. She has had 22 visits to the ER in the past 6 months for mainly behavioral health issues, although she has only stayed for full evaluation about 75% of the time. Has appointment with her PCP in 2 days for follow-up and labwork.   The history is provided by the patient.    Past Medical History:  Diagnosis Date  . Anxiety   . Bipolar 1 disorder ([place])   . Cognitive deficits   . Depression   . Diabetes mellitus without complication ([place])   . Hypertension   . Mental disorder   . Obesity     Patient Active Problem List   Diagnosis Date Noted  . 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 LGSIL on Pap smear of cervix 02/01/2017  . OCD (obsessive compulsive disorder) 10/05/2016  . Major depressive disorder, recurrent episode, mild ([place]) 05/04/2016  . 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 Learning disability 07/18/2015  . Impulse control disorder  07/18/2015  . Diabetes mellitus ([place]) 07/18/2015  . 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 Hyperlipidemia 07/18/2015  . Severe episode of recurrent major depressive disorder, without psychotic features ([place])   . Drug overdose   . Cognitive deficits 10/12/2012  . Generalized anxiety disorder 06/28/2012    Past Surgical History:  Procedure Laterality Date  . CESAREAN SECTION    . CESAREAN SECTION N/A 04/25/2013   Procedure: REPEAT CESAREAN SECTION;  Surgeon: [name], MD;  Location: [facility];  Service: Obstetrics;  Laterality: N/A;  . MASS EXCISION N/A 06/03/2012   Procedure: EXCISION MASS;  Surgeon: [name], MD;  Location: [facility];  Service: ENT;  Laterality: N/A;  Excision uvula mass  . TONSILLECTOMY N/A 06/03/2012   Procedure: TONSILLECTOMY;  Surgeon: [name], MD;  Location: [facility];  Service: ENT;  Laterality: N/A;  . TONSILLECTOMY      OB History    Gravida  3   Para  3   Term  3   Preterm  0   AB  0   Living  3     SAB  0   TAB  0   Ectopic  0   Multiple  0   Live Births  3            Home Medications    Prior to Admission medications   Medication Sig  Start Date End Date Taking? Authorizing Provider  busPIRone (BUSPAR) 15 MG tablet Take 1 tablet (15 mg total) by mouth 2 (two) times daily. For anxiety Patient taking differently: Take 15 mg by mouth 3 (three) times daily. For anxiety 10/06/16   Lindell Spar I, NP  DULoxetine (CYMBALTA) 60 MG capsule Take 60 mg by mouth 2 (two) times daily.     [provider]  fluvoxaMINE (LUVOX) 50 MG tablet Take 100 mg by mouth 2 (two) times daily.  02/02/17   [provider]  glipiZIDE (GLUCOTROL XL) 10 MG 24 hr tablet Take 1 tablet (10 mg total) by mouth every evening. For diabetes management Patient taking differently: Take 10 mg by mouth 2 (two) times daily. For diabetes management 10/06/16   Lindell Spar I, NP  hydrochlorothiazide (HYDRODIURIL) 25 MG tablet Take 1 TABLET BY MOUTH EVERY DAY for 30 days 02/15/17   [provider]  LEVEMIR FLEXTOUCH 100 UNIT/ML Pen Inject 30 units 2 times a day 02/10/17   [provider]  paliperidone (INVEGA SUSTENNA) 234 MG/1.5ML SUSP injection Inject 234 mg every 30 (thirty) days into the muscle.    [provider]  simvastatin (ZOCOR) 20 MG tablet Take 20 mg by mouth at bedtime. 07/16/17   [provider]    Family History Family History  Problem Relation Age of Onset  . Hypertension Mother   . Diabetes Father     Social History Social History   Tobacco Use  . Smoking status: Current Every Day Smoker    Packs/day: 1.00    Years: 11.00    Pack years: 11.00    Types: Cigarettes  . Smokeless tobacco: Never Used  Substance Use Topics  . Alcohol use: No  . Drug use: No    Comment: Patient denies     Allergies   Wellbutrin [bupropion]; Omnipaque [iohexol]; and Penicillins   Review of Systems Review of Systems  Constitutional: Positive for fatigue. Negative for activity change, appetite change, chills and fever.  Respiratory: Negative for cough, chest tightness, shortness of breath and wheezing.   Cardiovascular: Negative for chest pain, palpitations and leg swelling.  Gastrointestinal: Negative for diarrhea, nausea and vomiting.  Musculoskeletal: Positive for myalgias. Negative for back pain and gait problem.  Skin: Negative for rash and wound.  Neurological: Negative for dizziness, tremors, seizures, syncope, weakness, light-headedness and numbness.  Hematological: Negative for adenopathy. Does not bruise/bleed easily.  Psychiatric/Behavioral: Negative for suicidal ideas. The patient is nervous/anxious.      Physical Exam Triage Vital Signs ED Triage Vitals  Enc Vitals Group     BP 08/24/17 1649 138/89     Pulse Rate 08/24/17 1649 (!) 114     Resp 08/24/17 1649 (!) 22     Temp 08/24/17 1649  99.5 F (37.5 C)     Temp Source 08/24/17 1649 Oral     SpO2 08/24/17 1649 97 %     Weight --      Height --      Head Circumference --      Peak Flow --      Pain Score 08/24/17 1702 4     Pain Loc --      Pain Edu? --      Excl. in [place]? --    No data found.  Updated Vital Signs BP 138/89 (BP Location: Left Arm)   Pulse (!) 114   Temp 99.5 F (37.5 C) (Oral)   Resp (!) 22   SpO2  97%   Visual Acuity Right Eye Distance:   Left Eye Distance:   Bilateral Distance:    Right Eye Near:   Left Eye Near:    Bilateral Near:     Physical Exam  Constitutional: She is oriented to person, place, and time. She appears well-developed and well-nourished. She is cooperative. She does not appear ill. No distress.  Patient sitting comfortably on exam table in no acute distress.  HENT:  Head: Normocephalic and atraumatic.  Right Ear: External ear normal.  Left Ear: External ear normal.  Eyes: Pupils are equal, round, and reactive to light. Conjunctivae and EOM are normal.  Neck: Normal range of motion. Neck supple.  Cardiovascular: Regular rhythm and normal heart sounds. Tachycardia present.  Pulmonary/Chest: Effort normal and breath sounds normal. No respiratory distress. She has no decreased breath sounds. She has no wheezes. She has no rhonchi. She has no rales.  Musculoskeletal: Normal range of motion. She exhibits tenderness. She exhibits no edema.       Right lower leg: She exhibits tenderness. She exhibits no bony tenderness, no swelling, no edema, no deformity and no laceration.       Left lower leg: She exhibits tenderness. She exhibits no bony tenderness, no swelling, no edema, no deformity and no laceration.       Legs: Full range of motion of both lower legs and knees. Tender along entire lower legs, first on left leg but more on right leg along with lateral aspect of right knee. No distinct swelling or edema. No redness. Good distal pulses. No neuro deficits noted.     Neurological: She is alert and oriented to person, place, and time. She has normal strength and normal reflexes. No sensory deficit.  Skin: Skin is warm and dry. No rash noted.  Psychiatric: Her speech is normal. Judgment and thought content normal. Her mood appears anxious. She is agitated.  Vitals reviewed.    UC Treatments / Results  Labs (all labs ordered are listed, but only abnormal results are displayed) Labs Reviewed - No data to display  EKG None  Radiology No results found.  Procedures Procedures (including critical care time)  Medications Ordered in UC Medications - No data to display  Initial Impression / Assessment and Plan / UC Course  I have reviewed the triage vital signs and the nursing notes.  Pertinent labs & imaging results that were available during my care of the patient were reviewed by me and considered in my medical decision making (see chart for details).   Reviewed that we could do some labwork here today but we have limited treatment here at Urgent Care if those levels are not normal. Patient indicated that she has an appointment with her PCP in 2 days for routine check up and labwork and will have further evaluation at that time. For the next 2 days, encouraged to increase fluid (water) consumption to minimize dehydration. May apply heat and alternate with cool compresses for comfort. Follow-up with her PCP as planned.  Final Clinical Impressions(s) / UC Diagnoses   Final diagnoses:  Muscle cramps  Uncontrolled diabetes mellitus, with long-term current use of insulin ([place])  History of low potassium     Discharge Instructions     Recommend increase water intake to keep well-hydrated, especially with outside temperatures in upper 80's. May use heat and alternate with cold compresses on muscle areas that are most painful. Continue current medications as prescribed. Follow-up with your PCP in 2 days (already has  appointment) as planned for routine  exam and labwork. If pain or muscle cramps worsen, go to the ER.     ED Prescriptions    None     Controlled Substance Prescriptions [place] Controlled Substance Registry consulted? Not Applicable   Katy Apo, NP 08/25/17 1003

## 2017-08-24 NOTE — Discharge Instructions (Addendum)
Recommend increase water intake to keep well-hydrated, especially with outside temperatures in upper 80's. May use heat and alternate with cold compresses on muscle areas that are most painful. Continue current medications as prescribed. Follow-up with your PCP in 2 days (already has appointment) as planned for routine exam and labwork. If pain or muscle cramps worsen, go to the ER.

## 2017-08-24 NOTE — ED Triage Notes (Signed)
Pt here for body cramps; pt sts thinks her potassium is low and does take "water pills"

## 2017-09-04 ENCOUNTER — Encounter (HOSPITAL_COMMUNITY): Payer: Self-pay | Admitting: Emergency Medicine

## 2017-09-04 ENCOUNTER — Emergency Department (HOSPITAL_COMMUNITY)
Admission: EM | Admit: 2017-09-04 | Discharge: 2017-09-05 | Disposition: A | Discharge status: Home / Self Care | Payer: Medicaid Other | Attending: Emergency Medicine | Admitting: Emergency Medicine

## 2017-09-04 DIAGNOSIS — R51 Headache: Secondary | ICD-10-CM | POA: Insufficient documentation

## 2017-09-04 DIAGNOSIS — Z5321 Procedure and treatment not carried out due to patient leaving prior to being seen by health care provider: Secondary | ICD-10-CM | POA: Insufficient documentation

## 2017-09-04 NOTE — ED Triage Notes (Signed)
Patient presents to ED for assessment of a central forehead headache for 6 hours now.  States she feels generally weak.  Patient is laughing with her friend during this RN's assessment.  C/o some dizziness.

## 2017-09-05 NOTE — ED Notes (Signed)
Called Patient to be roomed x3 and had no response. 

## 2017-09-05 NOTE — ED Notes (Signed)
Called Patient x3 and had no response.

## 2017-09-06 ENCOUNTER — Encounter (HOSPITAL_COMMUNITY): Payer: Self-pay | Admitting: Emergency Medicine

## 2017-09-06 ENCOUNTER — Emergency Department (HOSPITAL_COMMUNITY)
Admission: EM | Admit: 2017-09-06 | Discharge: 2017-09-06 | Disposition: A | Discharge status: Home / Self Care | Payer: Medicaid Other | Attending: Emergency Medicine | Admitting: Emergency Medicine

## 2017-09-06 ENCOUNTER — Other Ambulatory Visit: Payer: Self-pay

## 2017-09-06 ENCOUNTER — Emergency Department (HOSPITAL_COMMUNITY)
Admission: EM | Admit: 2017-09-06 | Discharge: 2017-09-07 | Disposition: A | Discharge status: Home / Self Care | Payer: Medicaid Other | Source: Home / Self Care | Attending: Emergency Medicine | Admitting: Emergency Medicine

## 2017-09-06 DIAGNOSIS — R45851 Suicidal ideations: Secondary | ICD-10-CM

## 2017-09-06 DIAGNOSIS — R52 Pain, unspecified: Secondary | ICD-10-CM | POA: Insufficient documentation

## 2017-09-06 DIAGNOSIS — F4325 Adjustment disorder with mixed disturbance of emotions and conduct: Secondary | ICD-10-CM

## 2017-09-06 DIAGNOSIS — F17210 Nicotine dependence, cigarettes, uncomplicated: Secondary | ICD-10-CM

## 2017-09-06 DIAGNOSIS — E119 Type 2 diabetes mellitus without complications: Secondary | ICD-10-CM

## 2017-09-06 DIAGNOSIS — F633 Trichotillomania: Secondary | ICD-10-CM

## 2017-09-06 DIAGNOSIS — F332 Major depressive disorder, recurrent severe without psychotic features: Secondary | ICD-10-CM | POA: Diagnosis present

## 2017-09-06 DIAGNOSIS — I10 Essential (primary) hypertension: Secondary | ICD-10-CM | POA: Insufficient documentation

## 2017-09-06 DIAGNOSIS — F322 Major depressive disorder, single episode, severe without psychotic features: Secondary | ICD-10-CM | POA: Insufficient documentation

## 2017-09-06 DIAGNOSIS — Z794 Long term (current) use of insulin: Secondary | ICD-10-CM

## 2017-09-06 DIAGNOSIS — J029 Acute pharyngitis, unspecified: Secondary | ICD-10-CM | POA: Insufficient documentation

## 2017-09-06 DIAGNOSIS — Z5321 Procedure and treatment not carried out due to patient leaving prior to being seen by health care provider: Secondary | ICD-10-CM | POA: Diagnosis not present

## 2017-09-06 DIAGNOSIS — Z79899 Other long term (current) drug therapy: Secondary | ICD-10-CM

## 2017-09-06 DIAGNOSIS — F319 Bipolar disorder, unspecified: Secondary | ICD-10-CM

## 2017-09-06 LAB — COMPREHENSIVE METABOLIC PANEL
ALT: 26 U/L (ref 14–54)
AST: 37 U/L (ref 15–41)
Albumin: 4.3 g/dL (ref 3.5–5.0)
Alkaline Phosphatase: 50 U/L (ref 38–126)
Anion gap: 14 (ref 5–15)
BUN: 11 mg/dL (ref 6–20)
CHLORIDE: 100 mmol/L — AB (ref 101–111)
CO2: 23 mmol/L (ref 22–32)
Calcium: 9.7 mg/dL (ref 8.9–10.3)
Creatinine, Ser: 0.84 mg/dL (ref 0.44–1.00)
GFR calc non Af Amer: >60 mL/min (ref >60)
Glucose, Bld: 259 mg/dL — ABNORMAL HIGH (ref 65–99)
POTASSIUM: 3.4 mmol/L — AB (ref 3.5–5.1)
Sodium: 137 mmol/L (ref 135–145)
Total Bilirubin: 0.9 mg/dL (ref 0.3–1.2)
Total Protein: 8.1 g/dL (ref 6.5–8.1)

## 2017-09-06 LAB — CBC
HEMATOCRIT: 42.6 % (ref 36.0–46.0)
HEMOGLOBIN: 14 g/dL (ref 12.0–15.0)
MCH: 26.9 pg (ref 26.0–34.0)
MCHC: 32.9 g/dL (ref 30.0–36.0)
MCV: 81.8 fL (ref 78.0–100.0)
Platelets: 266 10*3/uL (ref 150–400)
RBC: 5.21 MIL/uL — ABNORMAL HIGH (ref 3.87–5.11)
RDW: 14.5 % (ref 11.5–15.5)
WBC: 9.6 10*3/uL (ref 4.0–10.5)

## 2017-09-06 LAB — RAPID URINE DRUG SCREEN, HOSP PERFORMED
Amphetamines: NOT DETECTED
BARBITURATES: NOT DETECTED
Benzodiazepines: NOT DETECTED
COCAINE: NOT DETECTED
Opiates: NOT DETECTED
TETRAHYDROCANNABINOL: NOT DETECTED

## 2017-09-06 LAB — I-STAT BETA HCG BLOOD, ED (MC, WL, AP ONLY)

## 2017-09-06 LAB — ACETAMINOPHEN LEVEL: Acetaminophen (Tylenol), Serum: <10 ug/mL — ABNORMAL LOW (ref 10–30)

## 2017-09-06 LAB — ETHANOL: Alcohol, Ethyl (B): <10 mg/dL (ref <10)

## 2017-09-06 LAB — SALICYLATE LEVEL: Salicylate Lvl: <7 mg/dL (ref 2.8–30.0)

## 2017-09-06 MED ORDER — SIMVASTATIN 20 MG PO TABS: 20.0000 mg | ORAL_TABLET | Freq: Every day | ORAL | Status: DC

## 2017-09-06 MED ORDER — INSULIN DETEMIR 100 UNIT/ML ~~LOC~~ SOLN
40.0000 [IU] | Freq: Two times a day (BID) | SUBCUTANEOUS | Status: DC
Start: 2017-09-06 — End: 2017-09-07
  Administered 2017-09-06: 40 [IU] via SUBCUTANEOUS
  Filled 2017-09-06 (×2): qty 0.4

## 2017-09-06 MED ORDER — GLIPIZIDE ER 10 MG PO TB24
10.0000 mg | ORAL_TABLET | Freq: Two times a day (BID) | ORAL | Status: DC
  Administered 2017-09-07: 10 mg via ORAL
  Filled 2017-09-06: qty 1

## 2017-09-06 MED ORDER — FLUVOXAMINE MALEATE 50 MG PO TABS: 100.0000 mg | ORAL_TABLET | Freq: Two times a day (BID) | ORAL | Status: DC

## 2017-09-06 MED ORDER — BUSPIRONE HCL 10 MG PO TABS
15.0000 mg | ORAL_TABLET | Freq: Two times a day (BID) | ORAL | Status: DC
  Administered 2017-09-06: 15 mg via ORAL
  Filled 2017-09-06: qty 2

## 2017-09-06 MED ORDER — DULOXETINE HCL 30 MG PO CPEP: 60.0000 mg | ORAL_CAPSULE | Freq: Two times a day (BID) | ORAL | Status: DC

## 2017-09-06 MED ORDER — DULOXETINE HCL 20 MG PO CPEP
20.0000 mg | ORAL_CAPSULE | Freq: Two times a day (BID) | ORAL | Status: DC
  Filled 2017-09-06: qty 1

## 2017-09-06 MED ORDER — HYDROCHLOROTHIAZIDE 25 MG PO TABS
25.0000 mg | ORAL_TABLET | Freq: Every day | ORAL | Status: DC
  Administered 2017-09-06: 25 mg via ORAL
  Filled 2017-09-06 (×2): qty 1

## 2017-09-06 NOTE — ED Notes (Signed)
Patient reports SI with a plan to stab herself. Patient denies HI and AVH. Plan of care discussed. Encouragement and support provided and safety maintain. Q 15 min safety checks in place and video monitoring.

## 2017-09-06 NOTE — BH Assessment (Addendum)
Assessment Note  [name] is an 27 y.o. female.  The pt came in via EMS due to having thoughts of killing herself by overdosing on insulin.  She stated she has been feeling this way about a day.  When asked what she would do if she was discharged, the pt said she would take all of her insulin.  She currently lives alone.  She stated she doesn't like living alone, so she goes downtown and walks downtown throughout the day.  She reported she would rather live in a group home.  The pt has not lived in a group home in the past.  She was previously living with her mother and can't go back there, because she doesn't get along with her mother.    The pt was last hospitalized 09/2016 at [facility].  The pt has attempted suicide about 4-5 times according to the pt.  The pt stated she overdosed when she tried to kill herself.  She is currently on CST with [facility].  The pt reported she sleeps about 4 hours a day and is eating more than what she should be eating.  The pt has been having crying spells, is more irritable and feels depressed.  The pt shaves her head and eyebrows and said she doesn't want to shave them. When she doesn't shave her hair she will pull her hair out.  The pt denies HI, SA and psychosis.  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 Anxiety   . Bipolar 1 disorder ([place])   . Cognitive deficits   . Depression   . Diabetes mellitus without complication ([place])   . Hypertension   . Mental disorder   . Obesity     Past Surgical History:  Procedure Laterality Date  . CESAREAN SECTION    . CESAREAN SECTION N/A 04/25/2013   Procedure: REPEAT CESAREAN SECTION;  Surgeon: [name], MD;  Location: [facility];  Service: Obstetrics;  Laterality: N/A;  . MASS EXCISION N/A 06/03/2012   Procedure: EXCISION MASS;  Surgeon: [name], MD;  Location:  [facility];  Service: ENT;  Laterality: N/A;  Excision uvula mass  . TONSILLECTOMY N/A 06/03/2012   Procedure: TONSILLECTOMY;  Surgeon: [name], MD;  Location: [facility];  Service: ENT;  Laterality: N/A;  . TONSILLECTOMY      Family History:  Family History  Problem Relation Age of Onset  . Hypertension Mother   . Diabetes Father     Social History:  reports that she has been smoking cigarettes.  She has a 11.00 pack-year smoking history. She has never used smokeless tobacco. She reports that she does not drink alcohol or use drugs.  Additional Social History:  Alcohol / Drug Use Pain Medications: See MAR Prescriptions: See MAR Over the Counter: See MAR History of alcohol / drug use?: No history of alcohol / drug abuse Longest period of sobriety (when/how long): NA  CIWA:   COWS:    Allergies:  Allergies  Allergen Reactions  . Wellbutrin [Bupropion] Shortness Of Breath  . Omnipaque [Iohexol] Swelling and Other (See Comments)    Reaction:  Eye swelling  . Penicillins Hives and Other (See Comments)    Has patient had a PCN reaction causing immediate rash, facial/tongue/throat swelling, SOB or lightheadedness with hypotension: No Has patient had a PCN reaction causing severe rash involving mucus membranes or  skin necrosis: No Has patient had a PCN reaction that required hospitalization No Has patient had a PCN reaction occurring within the last 10 years: No If all of the above answers are "NO", then may proceed with Cephalosporin use.    Home Medications:  (Not in a hospital admission)  OB/GYN Status:  No LMP recorded. Patient has had an implant.  General Assessment Data Location of Assessment: [facility] TTS Assessment: In system Is this a Tele or Face-to-Face Assessment?: Face-to-Face Is this an Initial Assessment or a Re-assessment for this encounter?: Initial Assessment Marital status: Single Maiden name: Froio Is patient pregnant?:  No Pregnancy Status: No Living Arrangements: Alone Can pt return to current living arrangement?: Yes Admission Status: Voluntary Is patient capable of signing voluntary admission?: Yes Referral Source: Self/Family/Friend Insurance type: Medicaid     Crisis Care Plan Living Arrangements: Alone Legal Guardian: Other:(Self) Name of Psychiatrist: [facility] Name of Therapist: [facility]  Education Status Is patient currently in school?: No Highest grade of school patient has completed: 11th grade. Is the patient employed, unemployed or receiving disability?: Receiving disability income  Risk to self with the past 6 months Suicidal Ideation: Yes-Currently Present Has patient been a risk to self within the past 6 months prior to admission? : Yes Suicidal Intent: Yes-Currently Present Has patient had any suicidal intent within the past 6 months prior to admission? : Yes Is patient at risk for suicide?: Yes Suicidal Plan?: Yes-Currently Present Has patient had any suicidal plan within the past 6 months prior to admission? : Yes Specify Current Suicidal Plan: overdose on medication Access to Means: Yes Specify Access to Suicidal Means: has medication at home What has been your use of drugs/alcohol within the last 12 months?: none Previous Attempts/Gestures: Yes How many times?: 4 Other Self Harm Risks: none Triggers for Past Attempts: Unpredictable Intentional Self Injurious Behavior: None Family Suicide History: No Recent stressful life event(s): Other (Comment)(living alone) Persecutory voices/beliefs?: No Depression: Yes Depression Symptoms: Despondent, Tearfulness Substance abuse history and/or treatment for substance abuse?: No Suicide prevention information given to non-admitted patients: Not applicable  Risk to Others within the past 6 months Homicidal Ideation: No Does patient have any lifetime risk of violence toward others beyond the six months prior to admission? : No Thoughts  of Harm to Others: No Current Homicidal Intent: No Current Homicidal Plan: No Access to Homicidal Means: No Identified Victim: none History of harm to others?: No Assessment of Violence: None Noted Violent Behavior Description: none Does patient have access to weapons?: No Criminal Charges Pending?: No Does patient have a court date: No Is patient on probation?: No  Psychosis Hallucinations: None noted Delusions: None noted  Mental Status Report Appearance/Hygiene: In scrubs, Unremarkable Eye Contact: Good Motor Activity: Unable to assess Speech: Logical/coherent Level of Consciousness: Alert Mood: Depressed Affect: Depressed, Anxious Anxiety Level: Minimal Thought Processes: Coherent, Relevant Judgement: Impaired Orientation: Person, Place, Time, Situation, Appropriate for developmental age Obsessive Compulsive Thoughts/Behaviors: None  Cognitive Functioning Concentration: Normal Memory: Recent Intact, Remote Intact Is patient IDD: No Is patient DD?: No I IQ score available?: No Insight: Poor Impulse Control: Poor Appetite: Good Have you had any weight changes? : No Change Amount of the weight change? (lbs): 0 lbs Sleep: Decreased Total Hours of Sleep: 4 Vegetative Symptoms: None  ADLScreening [facility] Assessment Services) Patient's cognitive ability adequate to safely complete daily activities?: Yes Patient able to express need for assistance with ADLs?: Yes Independently performs ADLs?: Yes (appropriate for developmental age)  Prior  Inpatient Therapy Prior Inpatient Therapy: Yes Prior Therapy Dates: 05/2017; multiple admits Prior Therapy Facilty/Provider(s): [facility] [place], [place] Reason for Treatment: Suicide attempt, depression  Prior Outpatient Therapy Prior Outpatient Therapy: Yes Prior Therapy Dates: Current Prior Therapy Facilty/Provider(s): [facility] Reason for Treatment: Medication management and counseling.  Does patient have an ACCT team?: No Does  patient have Intensive In-House Services?  : No Does patient have [facility] services? : No Does patient have P4CC services?: No  ADL Screening (condition at time of admission) Patient's cognitive ability adequate to safely complete daily activities?: Yes Patient able to express need for assistance with ADLs?: Yes Independently performs ADLs?: Yes (appropriate for developmental age)       Abuse/Neglect Assessment (Assessment to be complete while patient is alone) Abuse/Neglect Assessment Can Be Completed: Yes Physical Abuse: Denies Verbal Abuse: Denies Sexual Abuse: Denies Exploitation of patient/patient's resources: Denies Self-Neglect: Denies Values / Beliefs Cultural Requests During Hospitalization: None Spiritual Requests During Hospitalization: None Consults Spiritual Care Consult Needed: No Social Work Consult Needed: No      Additional Information 1:1 In Past 12 Months?: No CIRT Risk: No Elopement Risk: No Does patient have medical clearance?: Yes     Disposition:  Disposition Initial Assessment Completed for this Encounter: Yes Disposition of Patient: Admit Type of inpatient treatment program: Adult   PA [name] recommends inpatient treatment.  RN Myriam Forehand and PA Gerald Stabs were made aware of the recommendations.  On Site Evaluation by:   Reviewed with Physician:    Enzo Montgomery 09/06/2017 11:35 PM

## 2017-09-06 NOTE — ED Triage Notes (Signed)
Patient is brought in by [facility]. Patient stated she wanted to kill herself. [facility] states that patient said she was going to stab herself.

## 2017-09-06 NOTE — ED Notes (Signed)
Bed: [facility]37 Expected date:  Expected time:  Means of arrival:  Comments: 

## 2017-09-06 NOTE — ED Notes (Signed)
Attempt made to recheck pt's vitals no respond gotten.

## 2017-09-06 NOTE — ED Triage Notes (Signed)
Brought by ems for c/o sore throat today and pain "all over my body".  States my potassium might be low.

## 2017-09-07 NOTE — ED Notes (Signed)
Pt discharged home. Discharged instructions read to pt who verbalized understanding. All belongings returned to pt who signed for same. Denies SI/HI, is not delusional and not responding to internal stimuli. Escorted pt to the ED exit.    

## 2017-09-07 NOTE — [facility] Suicide Risk Assessment (Cosign Needed)
Suicide Risk Assessment  Discharge Assessment   [facility] Discharge Suicide Risk Assessment   Principal Problem: Severe episode of recurrent major depressive disorder, without psychotic features [facility]) Discharge Diagnoses:  Patient Active Problem List   Diagnosis Date Noted  . Adjustment disorder with mixed disturbance of emotions and conduct [F43.25] 08/03/2017  . LGSIL on Pap smear of cervix [R87.612] 02/01/2017  . OCD (obsessive compulsive disorder) [F42.9] 10/05/2016  . Major depressive disorder, recurrent episode, mild ([place]) [F33.0] 05/04/2016  . 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 Learning disability [F81.9] 07/18/2015  . 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 Diabetes mellitus ([place]) [E11.9] 07/18/2015  . MDD (major depressive disorder), recurrent, severe, with psychosis ([place]) [F33.3] 07/18/2015  . Hyperlipidemia [E78.5] 07/18/2015  . Severe episode of recurrent major depressive disorder, without psychotic features ([place]) [F33.2]   . Drug overdose [T50.901A]   . Cognitive deficits [R41.89] 10/12/2012  . Generalized anxiety disorder [F41.1] 06/28/2012   Pt was seen and chart reviewed with treatment team and Dr Mariea Clonts. Pt denies suicidal/homicidal ideation, denies auditory/visual hallucinations and does not appear to be responding to internal stimuli. Pt stated she started on some new meds last week and she thinks that is why she has had suicidal thoughts. Pt has outpatient psychiatry and will follow up when discharged. Pt's UDS and BAL are negative. Pt has been calm and cooperative while in the [place]. Pt is stable and psychiatrically clear for discharge.   Total Time spent with patient: 30 minutes  Musculoskeletal: Strength & Muscle Tone: within normal limits Gait & Station: normal Patient leans: N/A  Psychiatric Specialty Exam:   Blood pressure 124/77, pulse (!) 108, temperature 98.6 F (37 C), temperature source Oral, resp. rate 18, height 5\' 7"  (1.702  m), weight 253 lb (114.8 kg), SpO2 99 %.Body mass index is 39.63 kg/m.  General Appearance: Casual  Eye Contact::  Good  Speech:  Clear and Coherent and Normal Rate409  Volume:  Normal  Mood:  Depressed  Affect:  Congruent and Depressed  Thought Process:  Coherent, Goal Directed and Linear  Orientation:  Full (Time, Place, and Person)  Thought Content:  Logical  Suicidal Thoughts:  No  Homicidal Thoughts:  No  Memory:  Immediate;   Good Recent;   Good Remote;   Fair  Judgement:  Impaired  Insight:  Lacking  Psychomotor Activity:  Normal  Concentration:  Good  Recall:  Good  Fund of Knowledge:Good  Language: Good  Akathisia:  No  Handed:  Right  AIMS (if indicated):     Assets:  Agricultural consultant Housing Social Support  Sleep:     Cognition: WNL  ADL's:  Intact   Mental Status Per Nursing Assessment::   On Admission:   Suicidal thoughts  Demographic Factors:  Female, Low socioeconomic status and Unemployed  Loss Factors: Financial problems/change in socioeconomic status  Historical Factors: Impulsivity  Risk Reduction Factors:   Sense of responsibility to family  Continued Clinical Symptoms:  Bipolar Disorder:   Depressive phase Depression:   Impulsivity  Cognitive Features That Contribute To Risk:  Closed-mindedness    Suicide Risk:  Minimal: No identifiable suicidal ideation.  Patients presenting with no risk factors but with morbid ruminations; may be classified as minimal risk based on the severity of the depressive symptoms  Follow-up Information    [facility] Follow up.   Why:  Please follow for outpatient resources Contact information: [address] [place] [address] [phone]  Plan Of Care/Follow-up recommendations:  Activity:  as tolerated Diet:  Heart healthy  [name], NP 09/07/2017, 1:38 PM

## 2017-09-07 NOTE — ED Provider Notes (Signed)
[facility] DEPT Provider Note   CSN: [id number] Arrival date & time: 09/06/17  1949     History   Chief Complaint Chief Complaint  Patient presents with  . Suicidal    HPI [name] is a 27 y.o. female.  HPI Patient presents to the emergency department with suicidal ideations.  The patient states that she hates living alone and wants to be placed in a group home as well.  She states that she is suicidal and wants to stab herself.  The patient is very vague in her history.  Patient will not tell me any other history other than this. Past Medical History:  Diagnosis Date  . Anxiety   . Bipolar 1 disorder ([place])   . Cognitive deficits   . Depression   . Diabetes mellitus without complication ([place])   . Hypertension   . Mental disorder   . Obesity     Patient Active Problem List   Diagnosis Date Noted  . 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 LGSIL on Pap smear of cervix 02/01/2017  . OCD (obsessive compulsive disorder) 10/05/2016  . Major depressive disorder, recurrent episode, mild ([place]) 05/04/2016  . 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 Learning disability 07/18/2015  . 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 Diabetes mellitus ([place]) 07/18/2015  . MDD (major depressive disorder), recurrent, severe, with psychosis ([place]) 07/18/2015  . Hyperlipidemia 07/18/2015  . Severe episode of recurrent major depressive disorder, without psychotic features ([place])   . Drug overdose   . Cognitive deficits 10/12/2012  . Generalized anxiety disorder 06/28/2012    Past Surgical History:  Procedure Laterality Date  . CESAREAN SECTION    . CESAREAN SECTION N/A 04/25/2013   Procedure: REPEAT CESAREAN SECTION;  Surgeon: [name], MD;  Location: [facility];  Service: Obstetrics;  Laterality: N/A;  . MASS EXCISION N/A 06/03/2012   Procedure: EXCISION MASS;  Surgeon: [name], MD;  Location:  [facility];  Service: ENT;  Laterality: N/A;  Excision uvula mass  . TONSILLECTOMY N/A 06/03/2012   Procedure: TONSILLECTOMY;  Surgeon: [name], MD;  Location: [facility];  Service: ENT;  Laterality: N/A;  . TONSILLECTOMY       OB History    Gravida  3   Para  3   Term  3   Preterm  0   AB  0   Living  3     SAB  0   TAB  0   Ectopic  0   Multiple  0   Live Births  3            Home Medications    Prior to Admission medications   Medication Sig Start Date End Date Taking? Authorizing Provider  busPIRone (BUSPAR) 15 MG tablet Take 1 tablet (15 mg total) by mouth 2 (two) times daily. For anxiety Patient taking differently: Take 15 mg by mouth 3 (three) times daily. For anxiety 10/06/16  Yes Nwoko, Herbert Pun I, NP  DULoxetine (CYMBALTA) 20 MG capsule Take 20 mg by mouth 2 (two) times daily.   Yes [provider]  glipiZIDE (GLUCOTROL XL) 10 MG 24 hr tablet Take 1 tablet (10 mg total) by mouth every evening. For diabetes management Patient taking differently: Take 10 mg by mouth 2 (two) times daily. For diabetes management 10/06/16  Yes Lindell Spar I, NP  hydrochlorothiazide (HYDRODIURIL) 25 MG tablet Take 1 TABLET BY MOUTH EVERY DAY  for 30 days 02/15/17  Yes [provider]  lamoTRIgine (LAMICTAL) 25 MG tablet Take 25 mg by mouth at bedtime. 09/01/17  Yes [provider]  LEVEMIR FLEXTOUCH 100 UNIT/ML Pen Inject 40 units 2 times a day 02/10/17  Yes [provider]  paliperidone (INVEGA SUSTENNA) 234 MG/1.5ML SUSP injection Inject 234 mg every 30 (thirty) days into the muscle.   Yes [provider]    Family History Family History  Problem Relation Age of Onset  . Hypertension Mother   . Diabetes Father     Social History Social History   Tobacco Use  . Smoking status: Current Every Day Smoker    Packs/day: 1.00    Years: 11.00    Pack years: 11.00    Types: Cigarettes  . Smokeless  tobacco: Never Used  Substance Use Topics  . Alcohol use: No  . Drug use: No    Comment: Patient denies     Allergies   Wellbutrin [bupropion]; Omnipaque [iohexol]; and Penicillins   Review of Systems Review of Systems  All 5 caveat applies due to uncooperativeness and mental illness. Physical Exam Updated Vital Signs Ht 5\' 7"  (1.702 m)   Wt 114.8 kg (253 lb)   BMI 39.63 kg/m   Physical Exam  Constitutional: She is oriented to person, place, and time. She appears well-developed and well-nourished. No distress.  HENT:  Head: Normocephalic and atraumatic.  Mouth/Throat: Oropharynx is clear and moist.  Eyes: Pupils are equal, round, and reactive to light.  Neck: Normal range of motion. Neck supple.  Cardiovascular: Normal rate, regular rhythm and normal heart sounds. Exam reveals no gallop and no friction rub.  No murmur heard. Pulmonary/Chest: Effort normal and breath sounds normal. No respiratory distress. She has no wheezes.  Neurological: She is alert and oriented to person, place, and time. She exhibits normal muscle tone. Coordination normal.  Skin: Skin is warm and dry. Capillary refill takes less than 2 seconds. No rash noted. No erythema.  Psychiatric: Her mood appears anxious. She is agitated. She expresses suicidal ideation. She expresses no suicidal plans.  Nursing note and vitals reviewed.    ED Treatments / Results  Labs (all labs ordered are listed, but only abnormal results are displayed) Labs Reviewed  COMPREHENSIVE METABOLIC PANEL - Abnormal; Notable for the following components:      Result Value   Potassium 3.4 (*)    Chloride 100 (*)    Glucose, Bld 259 (*)    All other components within normal limits  ACETAMINOPHEN LEVEL - Abnormal; Notable for the following components:   Acetaminophen (Tylenol), Serum <10 (*)    All other components within normal limits  CBC - Abnormal; Notable for the following components:   RBC 5.21 (*)    All other  components within normal limits  ETHANOL  SALICYLATE LEVEL  RAPID URINE DRUG SCREEN, HOSP PERFORMED  I-STAT BETA HCG BLOOD, ED ([facility], [facility], [facility] ONLY)    EKG None  Radiology No results found.  Procedures Procedures (including critical care time)  Medications Ordered in ED Medications  busPIRone (BUSPAR) tablet 15 mg (15 mg Oral Given 09/06/17 2259)  glipiZIDE (GLUCOTROL XL) 24 hr tablet 10 mg (has no administration in time range)  insulin detemir (LEVEMIR) injection 40 Units (40 Units Subcutaneous Given 09/06/17 2328)  hydrochlorothiazide (HYDRODIURIL) tablet 25 mg (25 mg Oral Given 09/06/17 2303)  DULoxetine (CYMBALTA) DR capsule 20 mg (has no administration in time range)     Initial Impression /  Assessment and Plan / ED Course  I have reviewed the triage vital signs and the nursing notes.  Pertinent labs & imaging results that were available during my care of the patient were reviewed by me and considered in my medical decision making (see chart for details).     Patient will need TTS assessment for her suicidality.  Final Clinical Impressions(s) / ED Diagnoses   Final diagnoses:  None    ED Discharge Orders    None       Dalia Heading, PA-C 09/07/17 0019    Drenda Freeze, MD 09/08/17 [phone]

## 2017-09-11 ENCOUNTER — Encounter (HOSPITAL_COMMUNITY): Payer: Self-pay | Admitting: Emergency Medicine

## 2017-09-11 ENCOUNTER — Other Ambulatory Visit: Payer: Self-pay

## 2017-09-11 ENCOUNTER — Emergency Department (HOSPITAL_COMMUNITY)
Admission: EM | Admit: 2017-09-11 | Discharge: 2017-09-12 | Disposition: A | Discharge status: Home / Self Care | Payer: Medicaid Other | Attending: Emergency Medicine | Admitting: Emergency Medicine

## 2017-09-11 DIAGNOSIS — E1165 Type 2 diabetes mellitus with hyperglycemia: Secondary | ICD-10-CM | POA: Diagnosis not present

## 2017-09-11 DIAGNOSIS — R45851 Suicidal ideations: Secondary | ICD-10-CM | POA: Diagnosis not present

## 2017-09-11 DIAGNOSIS — F17210 Nicotine dependence, cigarettes, uncomplicated: Secondary | ICD-10-CM | POA: Insufficient documentation

## 2017-09-11 DIAGNOSIS — R739 Hyperglycemia, unspecified: Secondary | ICD-10-CM

## 2017-09-11 DIAGNOSIS — Z79899 Other long term (current) drug therapy: Secondary | ICD-10-CM | POA: Diagnosis not present

## 2017-09-11 DIAGNOSIS — I10 Essential (primary) hypertension: Secondary | ICD-10-CM | POA: Insufficient documentation

## 2017-09-11 DIAGNOSIS — F332 Major depressive disorder, recurrent severe without psychotic features: Secondary | ICD-10-CM | POA: Insufficient documentation

## 2017-09-11 DIAGNOSIS — F329 Major depressive disorder, single episode, unspecified: Secondary | ICD-10-CM | POA: Diagnosis present

## 2017-09-11 DIAGNOSIS — F411 Generalized anxiety disorder: Secondary | ICD-10-CM | POA: Insufficient documentation

## 2017-09-11 LAB — I-STAT BETA HCG BLOOD, ED (MC, WL, AP ONLY): I-stat hCG, quantitative: <5 m[IU]/mL (ref <5)

## 2017-09-11 NOTE — ED Notes (Signed)
Pt belongings placed in [facility].

## 2017-09-11 NOTE — ED Triage Notes (Signed)
Pt states she is having suicidal thoughts with plans to overdose on her anti-depressants. States she needs help with her OCD.  States her OCD makes her shave her head and she's tired of doing it and we have not helped.

## 2017-09-12 ENCOUNTER — Encounter (HOSPITAL_COMMUNITY): Payer: Self-pay | Admitting: Registered Nurse

## 2017-09-12 LAB — COMPREHENSIVE METABOLIC PANEL
ALT: 27 U/L (ref 14–54)
ANION GAP: 12 (ref 5–15)
AST: 29 U/L (ref 15–41)
Albumin: 4.3 g/dL (ref 3.5–5.0)
Alkaline Phosphatase: 57 U/L (ref 38–126)
BUN: 11 mg/dL (ref 6–20)
CO2: 29 mmol/L (ref 22–32)
Calcium: 9.7 mg/dL (ref 8.9–10.3)
Chloride: 96 mmol/L — ABNORMAL LOW (ref 101–111)
Creatinine, Ser: 0.95 mg/dL (ref 0.44–1.00)
GFR calc non Af Amer: >60 mL/min (ref >60)
Glucose, Bld: 347 mg/dL — ABNORMAL HIGH (ref 65–99)
POTASSIUM: 3.5 mmol/L (ref 3.5–5.1)
SODIUM: 137 mmol/L (ref 135–145)
Total Bilirubin: 0.9 mg/dL (ref 0.3–1.2)
Total Protein: 7.2 g/dL (ref 6.5–8.1)

## 2017-09-12 LAB — CBG MONITORING, ED
Glucose-Capillary: 363 mg/dL — ABNORMAL HIGH (ref 65–99)
Glucose-Capillary: 366 mg/dL — ABNORMAL HIGH (ref 65–99)
Glucose-Capillary: 320 mg/dL — ABNORMAL HIGH (ref 65–99)
Glucose-Capillary: 246 mg/dL — ABNORMAL HIGH (ref 65–99)

## 2017-09-12 LAB — RAPID URINE DRUG SCREEN, HOSP PERFORMED
Amphetamines: NOT DETECTED
BENZODIAZEPINES: NOT DETECTED
Barbiturates: NOT DETECTED
Cocaine: NOT DETECTED
Opiates: NOT DETECTED
Tetrahydrocannabinol: NOT DETECTED

## 2017-09-12 LAB — URINALYSIS, ROUTINE W REFLEX MICROSCOPIC
BACTERIA UA: NONE SEEN
BILIRUBIN URINE: NEGATIVE
Glucose, UA: >=500 mg/dL — AB
Ketones, ur: NEGATIVE mg/dL
LEUKOCYTES UA: NEGATIVE
NITRITE: NEGATIVE
PROTEIN: 30 mg/dL — AB
Specific Gravity, Urine: 1.038 — ABNORMAL HIGH (ref 1.005–1.030)
pH: 6 (ref 5.0–8.0)

## 2017-09-12 LAB — CBC
HEMATOCRIT: 41.1 % (ref 36.0–46.0)
Hemoglobin: 13.3 g/dL (ref 12.0–15.0)
MCH: 26.1 pg (ref 26.0–34.0)
MCHC: 32.4 g/dL (ref 30.0–36.0)
MCV: 80.7 fL (ref 78.0–100.0)
Platelets: 224 10*3/uL (ref 150–400)
RBC: 5.09 MIL/uL (ref 3.87–5.11)
RDW: 13.6 % (ref 11.5–15.5)
WBC: 9.5 10*3/uL (ref 4.0–10.5)

## 2017-09-12 LAB — SALICYLATE LEVEL

## 2017-09-12 LAB — ETHANOL: Alcohol, Ethyl (B): <10 mg/dL (ref <10)

## 2017-09-12 LAB — ACETAMINOPHEN LEVEL

## 2017-09-12 MED ORDER — BUSPIRONE HCL 10 MG PO TABS
15.0000 mg | ORAL_TABLET | Freq: Two times a day (BID) | ORAL | Status: DC
  Administered 2017-09-12 (×2): 15 mg via ORAL
  Filled 2017-09-12 (×2): qty 2

## 2017-09-12 MED ORDER — GLIPIZIDE ER 10 MG PO TB24
10.0000 mg | ORAL_TABLET | Freq: Two times a day (BID) | ORAL | Status: DC
  Administered 2017-09-12: 10 mg via ORAL
  Filled 2017-09-12 (×2): qty 1

## 2017-09-12 MED ORDER — LAMOTRIGINE 25 MG PO TABS: 25.0000 mg | ORAL_TABLET | Freq: Every day | ORAL | Status: DC

## 2017-09-12 MED ORDER — DULOXETINE HCL 20 MG PO CPEP
20.0000 mg | ORAL_CAPSULE | Freq: Two times a day (BID) | ORAL | Status: DC
  Administered 2017-09-12: 20 mg via ORAL
  Filled 2017-09-12: qty 1

## 2017-09-12 MED ORDER — ACETAMINOPHEN 325 MG PO TABS
650.0000 mg | ORAL_TABLET | ORAL | Status: DC | PRN
Start: 2017-09-12 — End: 2017-09-12

## 2017-09-12 MED ORDER — INSULIN ASPART 100 UNIT/ML ~~LOC~~ SOLN
5.0000 [IU] | Freq: Once | SUBCUTANEOUS | Status: AC
  Administered 2017-09-12: 5 [IU] via SUBCUTANEOUS
  Filled 2017-09-12: qty 1

## 2017-09-12 MED ORDER — INSULIN DETEMIR 100 UNIT/ML ~~LOC~~ SOLN
40.0000 [IU] | Freq: Every day | SUBCUTANEOUS | Status: DC
  Filled 2017-09-12: qty 0.4

## 2017-09-12 MED ORDER — HYDROCHLOROTHIAZIDE 25 MG PO TABS
25.0000 mg | ORAL_TABLET | Freq: Every day | ORAL | Status: DC
  Administered 2017-09-12: 25 mg via ORAL
  Filled 2017-09-12: qty 1

## 2017-09-12 MED ORDER — SODIUM CHLORIDE 0.9 % IV BOLUS
1000.0000 mL | Freq: Once | INTRAVENOUS | Status: AC
  Administered 2017-09-12: 1000 mL via INTRAVENOUS

## 2017-09-12 MED ORDER — ALUM & MAG HYDROXIDE-SIMETH 200-200-20 MG/5ML PO SUSP: 30.0000 mL | Freq: Four times a day (QID) | ORAL | Status: DC | PRN

## 2017-09-12 NOTE — BH Assessment (Signed)
[facility] Assessment Progress Note     TTS Reassessment:  Patient was seen this morning and continue to present with a flat affect.  She states that she is depressed, but feels less depressed than she did at admission because she states that she was able to get some sleep.  When asked if she continues to be suicidal, she states that she is not.  She denies HI/Psychosis.  Patient has a pattern of similar behaviors and generally clears quickly and is able to return home.  Patient has a multitude of ED admissions under similar circumstances.  TTS to have FNP, [name],  to telepsych in on patient for final disposition.

## 2017-09-12 NOTE — ED Provider Notes (Signed)
[facility] EMERGENCY DEPARTMENT Provider Note   CSN: [id number] Arrival date & time: 09/11/17  2205     History   Chief Complaint Chief Complaint  Patient presents with  . Suicidal    HPI [name] is a 27 y.o. female with a hx of anxiety, bipolar disorder, depression, HTN presents to the Emergency Department complaining of gradual, persistent, progressively worsening suicidal ideation onset yesterday afternoon.  Pt reports she made a plan to overdose on her depression medication, but did not harm herself.  Pt denies alcohol or drug usage.  She denies homicidal ideation, auditory or visual hallucinations.  No alleviating factors.  She reports that her OCD aggravates her suicidal ideation.  She denies headache, neck pain, chest pain, shortness of breath, abdominal pain, nausea, vomiting, diarrhea, weakness, dizziness, syncope, dysuria, hematuria.  The history is provided by the patient and medical records. No language interpreter was used.    Past Medical History:  Diagnosis Date  . Anxiety   . Bipolar 1 disorder ([place])   . Cognitive deficits   . Depression   . Diabetes mellitus without complication ([place])   . Hypertension   . Mental disorder   . Obesity     Patient Active Problem List   Diagnosis Date Noted  . 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 LGSIL on Pap smear of cervix 02/01/2017  . OCD (obsessive compulsive disorder) 10/05/2016  . Major depressive disorder, recurrent episode, mild ([place]) 05/04/2016  . 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 Learning disability 07/18/2015  . 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 Diabetes mellitus ([place]) 07/18/2015  . MDD (major depressive disorder), recurrent, severe, with psychosis ([place]) 07/18/2015  . Hyperlipidemia 07/18/2015  . Severe episode of recurrent major depressive disorder, without psychotic features ([place])   . Drug overdose   . Cognitive  deficits 10/12/2012  . Generalized anxiety disorder 06/28/2012    Past Surgical History:  Procedure Laterality Date  . CESAREAN SECTION    . CESAREAN SECTION N/A 04/25/2013   Procedure: REPEAT CESAREAN SECTION;  Surgeon: [name], MD;  Location: [facility];  Service: Obstetrics;  Laterality: N/A;  . MASS EXCISION N/A 06/03/2012   Procedure: EXCISION MASS;  Surgeon: [name], MD;  Location: [facility];  Service: ENT;  Laterality: N/A;  Excision uvula mass  . TONSILLECTOMY N/A 06/03/2012   Procedure: TONSILLECTOMY;  Surgeon: [name], MD;  Location: [facility];  Service: ENT;  Laterality: N/A;  . TONSILLECTOMY       OB History    Gravida  3   Para  3   Term  3   Preterm  0   AB  0   Living  3     SAB  0   TAB  0   Ectopic  0   Multiple  0   Live Births  3            Home Medications    Prior to Admission medications   Medication Sig Start Date End Date Taking? Authorizing Provider  busPIRone (BUSPAR) 15 MG tablet Take 1 tablet (15 mg total) by mouth 2 (two) times daily. For anxiety Patient taking differently: Take 15 mg by mouth 3 (three) times daily. For anxiety 10/06/16  Yes Nwoko, Herbert Pun I, NP  DULoxetine (CYMBALTA) 20 MG capsule Take 20 mg by mouth 2 (two) times daily.   Yes [provider]  glipiZIDE (GLUCOTROL XL) 10 MG 24  hr tablet Take 1 tablet (10 mg total) by mouth every evening. For diabetes management Patient taking differently: Take 10 mg by mouth 2 (two) times daily. For diabetes management 10/06/16  Yes Lindell Spar I, NP  hydrochlorothiazide (HYDRODIURIL) 25 MG tablet Take 1 TABLET BY MOUTH EVERY DAY for 30 days 02/15/17  Yes [provider]  lamoTRIgine (LAMICTAL) 25 MG tablet Take 25 mg by mouth at bedtime. 09/01/17  Yes [provider]  LEVEMIR FLEXTOUCH 100 UNIT/ML Pen Inject 40 units 2 times a day 02/10/17  Yes [provider]  paliperidone (INVEGA SUSTENNA) 234  MG/1.5ML SUSP injection Inject 234 mg every 30 (thirty) days into the muscle.   Yes [provider]    Family History Family History  Problem Relation Age of Onset  . Hypertension Mother   . Diabetes Father     Social History Social History   Tobacco Use  . Smoking status: Current Every Day Smoker    Packs/day: 1.00    Years: 11.00    Pack years: 11.00    Types: Cigarettes  . Smokeless tobacco: Never Used  Substance Use Topics  . Alcohol use: No  . Drug use: No    Comment: Patient denies     Allergies   Wellbutrin [bupropion]; Omnipaque [iohexol]; and Penicillins   Review of Systems Review of Systems  Constitutional: Negative for appetite change, diaphoresis, fatigue, fever and unexpected weight change.  HENT: Negative for mouth sores.   Eyes: Negative for visual disturbance.  Respiratory: Negative for cough, chest tightness, shortness of breath and wheezing.   Cardiovascular: Negative for chest pain.  Gastrointestinal: Negative for abdominal pain, constipation, diarrhea, nausea and vomiting.  Endocrine: Negative for polydipsia, polyphagia and polyuria.  Genitourinary: Negative for dysuria, frequency, hematuria and urgency.  Musculoskeletal: Negative for back pain and neck stiffness.  Skin: Negative for rash.  Allergic/Immunologic: Negative for immunocompromised state.  Neurological: Negative for syncope, light-headedness and headaches.  Hematological: Does not bruise/bleed easily.  Psychiatric/Behavioral: Positive for suicidal ideas. Negative for sleep disturbance. The patient is not nervous/anxious.      Physical Exam Updated Vital Signs BP (!) 132/100 (BP Location: Right Arm)   Pulse (!) 115   Temp 98.6 F (37 C) (Oral)   Resp 16   SpO2 96%   Physical Exam  Constitutional: She appears well-developed and well-nourished. No distress.  Awake, alert, nontoxic appearance  HENT:  Head: Normocephalic and atraumatic.  Mouth/Throat: Oropharynx is  clear and moist. No oropharyngeal exudate.  Eyes: Conjunctivae are normal. No scleral icterus.  Neck: Normal range of motion. Neck supple.  Cardiovascular: Regular rhythm and intact distal pulses. Tachycardia present.  Pulses:      Radial pulses are 2+ on the right side, and 2+ on the left side.  Pulmonary/Chest: Effort normal and breath sounds normal. No respiratory distress. She has no wheezes.  Equal chest expansion  Abdominal: Soft. Bowel sounds are normal. She exhibits no mass. There is no tenderness. There is no rebound and no guarding.  Musculoskeletal: Normal range of motion. She exhibits no edema.  Neurological: She is alert.  Speech is clear and goal oriented Moves extremities without ataxia  Skin: Skin is warm and dry. She is not diaphoretic.  Psychiatric: She has a normal mood and affect.  Nursing note and vitals reviewed.    ED Treatments / Results  Labs (all labs ordered are listed, but only abnormal results are displayed) Labs Reviewed  COMPREHENSIVE METABOLIC PANEL - Abnormal; Notable for the following  components:      Result Value   Chloride 96 (*)    Glucose, Bld 347 (*)    All other components within normal limits  ACETAMINOPHEN LEVEL - Abnormal; Notable for the following components:   Acetaminophen (Tylenol), Serum <10 (*)    All other components within normal limits  URINALYSIS, ROUTINE W REFLEX MICROSCOPIC - Abnormal; Notable for the following components:   Specific Gravity, Urine 1.038 (*)    Glucose, UA >=500 (*)    Hgb urine dipstick MODERATE (*)    Protein, ur 30 (*)    All other components within normal limits  CBG MONITORING, ED - Abnormal; Notable for the following components:   Glucose-Capillary 363 (*)    All other components within normal limits  CBG MONITORING, ED - Abnormal; Notable for the following components:   Glucose-Capillary 366 (*)    All other components within normal limits  ETHANOL  SALICYLATE LEVEL  CBC  RAPID URINE DRUG  SCREEN, HOSP PERFORMED  I-STAT BETA HCG BLOOD, ED ([facility], [facility], [facility] ONLY)   Procedures Procedures (including critical care time)  Medications Ordered in ED Medications  acetaminophen (TYLENOL) tablet 650 mg (has no administration in time range)  alum & mag hydroxide-simeth (MAALOX/MYLANTA) 200-200-20 MG/5ML suspension 30 mL (has no administration in time range)  busPIRone (BUSPAR) tablet 15 mg (15 mg Oral Given 09/12/17 0446)  DULoxetine (CYMBALTA) DR capsule 20 mg (has no administration in time range)  glipiZIDE (GLUCOTROL XL) 24 hr tablet 10 mg (has no administration in time range)  lamoTRIgine (LAMICTAL) tablet 25 mg (has no administration in time range)  hydrochlorothiazide (HYDRODIURIL) tablet 25 mg (has no administration in time range)  insulin detemir (LEVEMIR) injection 40 Units (has no administration in time range)  sodium chloride 0.9 % bolus 1,000 mL (1,000 mLs Intravenous New Bag/Given 09/12/17 0638)  insulin aspart (novoLOG) injection 5 Units (5 Units Subcutaneous Given 09/12/17 0445)     Initial Impression / Assessment and Plan / ED Course  I have reviewed the triage vital signs and the nursing notes.  Pertinent labs & imaging results that were available during my care of the patient were reviewed by me and considered in my medical decision making (see chart for details).  Clinical Course as of Sep 12 728  Sun Sep 12, 2017  0327 Tachycardic on arrival; improving on my exam.  Pulse Rate(!): 115 [HM]  0327 Glucose elevated however patient denies polyuria or polydipsia.  Glucose-Capillary(!): 363 [HM]  [id number] Meets inpatient criteria.  No beds available at [facility] at this time.   [HM]  0725 HR improving  Pulse Rate: 100 [HM]    Clinical Course User Index [HM] Casara Perrier, Jarrett Soho, PA-C    Pt presents with suicidal ideation.  She is without other complaints today.  Patient with plan to overdose.  Labs are largely reassuring.  Patient's glucose is elevated however without evidence  of DKA.  Fluids given.  Patient initially tachycardic.  This has improved some with fluids.  Patient also given insulin.  No evidence of emergent condition for which further medical intervention is warranted at this time.  Patient meets inpatient criteria.  BP 110/71 (BP Location: Right Arm)   Pulse 100   Temp 98.1 F (36.7 C) (Oral)   Resp 18   Ht 5\' 7"  (1.702 m)   Wt 113.9 kg (251 lb)   SpO2 97%   BMI 39.31 kg/m    Final Clinical Impressions(s) / ED Diagnoses   Final diagnoses:  Suicidal  ideation    ED Discharge Orders    None       Malorie Bigford, Gwenlyn Perking 09/12/17 0730    Ripley Fraise, MD 09/12/17 [phone]

## 2017-09-12 NOTE — ED Notes (Signed)
Patient given meal tray.

## 2017-09-12 NOTE — Progress Notes (Signed)
Patient meets criteria for inpatient treatment. CSW faxed referrals to the following facilities for review.  [facility], [facility], [facility], [facility], [facility], [facility], [facility], [facility], [facility], [facility], [facility], [facility], [facility], [facility], [facility], [facility], [facility], [place] [facility], [place] [facility], [facility], [place], [facility], [facility] [facility], and [name] [place].   TTS will continue to seek bed placement.     Herbert Moors, MSW, LCSW-A, LCAS-A 09/12/2017 8:10 AM

## 2017-09-12 NOTE — ED Notes (Signed)
Gave pt [place] sandwich meal and h20.

## 2017-09-12 NOTE — ED Notes (Signed)
Patient alert and speaking with TTS.

## 2017-09-12 NOTE — BH Assessment (Addendum)
Tele Assessment Note   Patient Name: [name] MRN: [id number] Referring Physician: Theodoro Grist PA Location of Patient: [facility] Location of Provider: [place] is an 27 y.o. female who came to the [facility] today voluntarily and unaccompanied as is her pattern and baseline. Pt sts she has been having SI with a plan to overdose herself. Pt sts she has attempted to kill herself 4 or 5 times in the distance past. Pt sts her SI came on her today. Pt sts she has been upset over her OCD symptoms which she sts trigger her shaving her head which she sts she is tired of. Pt denies HI, SHI and AVH, Pt has visited the ED about 24-25 times since 04/27/17 complaining each time of similar symptoms. Pt has CST services through [facility]. Pt's main contact at [facility] is Felencio [place] Silos [phone].) Pt also sts she goes to [facility] for medication management and OP therapy. Pt has been psychiatrically hospitalized multiple times at a multitude of places including [facility], [place],  [place], [place] and [place]. Pt sts he most recent IP stay was in June of 2018. Pt sts she is complaint with taking her prescribed medications.  Pt sts she lives alone but is lonely and wants to live in a group home where she can interact with people. Pt sts she has nothing to do all day but wander around downtown and binge eat, Pt sts she stopped school after the 11th grade. Pt receives Disability income and is currently unemployed. Pt sts her mother is her sole emotional support even though they do not get along and pt is not allowed to move back in with her. Per pt hx, pt has 3 biological children ages 16, 26 and 43 yo who are all reportedly being or have been adopted. Pt denies any self harming behaviors. Pt sts before she shaved her head she would pull her hair and eyebrows out. Pt denies any alcohol or substances use. Pt's BAL and UDS are negative when tested in the ED today. Pt has several  chronic health conditions including Diabetes and Hypertension. Per pt hx, pt also has "cognitive deficits." No further details are available. Per pt hx, pt has been diagnosed with Bipolar I D/O, OCD and GAD. Pt denies access to guns or weapons. Pt sts she sleeps about 5-6 hours per night and continues to binge eat often. Pt sts she does not know is she has gained weight. Pt denies any hx of abuse. Pt's symptoms of depression including sadness, fatigue, excessive guilt, decreased self esteem, tearfulness / crying spells, self isolation, lack of motivation for activities and pleasure, irritability, negative outlook, difficulty thinking & concentrating, feeling helpless and hopeless, sleep and eating disturbances. Pt denies any symptoms of panic attacks but sts she is anxious and restless daily.   Pt was dressed in scrubs and lying on her hospital bed. Pt was alert, cooperative and polite. Pt kept fair eye contact, spoke in a soft clear tone and at a slow pace. Pt moved in a normal manner when moving. Pt's thought process was coherent and relevant and judgement seemed impaired.  No indication of delusional thinking or response to internal stimuli. Pt's mood was stated as depressed and anxious and her blunted affect was congruent.  Pt was oriented x 4, to person, place, time and situation.   Diagnosis:  F33.2 MDD, Recurrent, Severe; F41.1 GAD  Past Medical History:  Past Medical History:  Diagnosis  Date  . Anxiety   . Bipolar 1 disorder ([place])   . Cognitive deficits   . Depression   . Diabetes mellitus without complication ([place])   . Hypertension   . Mental disorder   . Obesity     Past Surgical History:  Procedure Laterality Date  . CESAREAN SECTION    . CESAREAN SECTION N/A 04/25/2013   Procedure: REPEAT CESAREAN SECTION;  Surgeon: [name], MD;  Location: [place] [facility];  Service: Obstetrics;  Laterality: N/A;  . MASS EXCISION N/A 06/03/2012   Procedure: EXCISION MASS;  Surgeon: [name],  MD;  Location: [place];  Service: ENT;  Laterality: N/A;  Excision uvula mass  . TONSILLECTOMY N/A 06/03/2012   Procedure: TONSILLECTOMY;  Surgeon: [name], MD;  Location: [place];  Service: ENT;  Laterality: N/A;  . TONSILLECTOMY      Family History:  Family History  Problem Relation Age of Onset  . Hypertension Mother   . Diabetes Father     Social History:  reports that she has been smoking cigarettes.  She has a 11.00 pack-year smoking history. She has never used smokeless tobacco. She reports that she does not drink alcohol or use drugs.  Additional Social History:  Alcohol / Drug Use Prescriptions: SEE MAR History of alcohol / drug use?: No history of alcohol / drug abuse  CIWA: CIWA-Ar BP: (!) 132/100 Pulse Rate: (!) 115 COWS:    Allergies:  Allergies  Allergen Reactions  . Wellbutrin [Bupropion] Shortness Of Breath  . Omnipaque [Iohexol] Swelling and Other (See Comments)    Reaction:  Eye swelling  . Penicillins Hives and Other (See Comments)    Has patient had a PCN reaction causing immediate rash, facial/tongue/throat swelling, SOB or lightheadedness with hypotension: No Has patient had a PCN reaction causing severe rash involving mucus membranes or skin necrosis: No Has patient had a PCN reaction that required hospitalization No Has patient had a PCN reaction occurring within the last 10 years: No If all of the above answers are "NO", then may proceed with Cephalosporin use.    Home Medications:  (Not in a hospital admission)  OB/GYN Status:  No LMP recorded. Patient has had an implant.  General Assessment Data Location of Assessment: [facility] ED TTS Assessment: In system Is this a Tele or Face-to-Face Assessment?: Tele Assessment Is this an Initial Assessment or a Re-assessment for this encounter?: Initial Assessment Marital status: Single Maiden name: [place] Is patient pregnant?: No Pregnancy Status: No Living  Arrangements: Alone Can pt return to current living arrangement?: Yes Admission Status: Voluntary Is patient capable of signing voluntary admission?: Yes Referral Source: Self/Family/Friend Insurance type: [place] Living Arrangements: Alone Name of Psychiatrist: [facility]OUGH [facility]) Name of Therapist: [facility])  Education Status Is patient currently in school?: No Highest grade of school patient has completed: 11 Is the patient employed, unemployed or receiving disability?: Receiving disability income  Risk to self with the past 6 months Suicidal Ideation: Yes-Currently Present Has patient been a risk to self within the past 6 months prior to admission? : Yes Suicidal Intent: Yes-Currently Present Has patient had any suicidal intent within the past 6 months prior to admission? : Yes Is patient at risk for suicide?: Yes Suicidal Plan?: No-Not Currently/Within Last 6 Months Has patient had any suicidal plan within the past 6 months prior to admission? : Yes Specify Current Suicidal Plan: OD ON PILLS Access to Means:  Yes Specify Access to Suicidal Means: PRESCRIBED MEDS What has been your use of drugs/alcohol within the last 12 months?: NO USE Previous Attempts/Gestures: Yes How many times?: 4("4 OR 5") Other Self Harm Risks: NONE REPORTED Triggers for Past Attempts: Unpredictable Intentional Self Injurious Behavior: Damaging(SHAVING HEAD OR PULLING OUT HER HAIR & EYEBROWS) Comment - Self Injurious Behavior: NA Family Suicide History: No Recent stressful life event(s): Other (Comment)(LONELINESS) Persecutory voices/beliefs?: Yes Depression: Yes Depression Symptoms: Despondent, Insomnia, Tearfulness, Isolating, Fatigue, Guilt, Loss of interest in usual pleasures, Feeling worthless/self pity, Feeling angry/irritable Substance abuse history and/or treatment for substance abuse?: No Suicide prevention information given to non-admitted  patients: Not applicable  Risk to Others within the past 6 months Homicidal Ideation: No Does patient have any lifetime risk of violence toward others beyond the six months prior to admission? : No Thoughts of Harm to Others: No Current Homicidal Intent: No Current Homicidal Plan: No Access to Homicidal Means: No(DENIES ACCCESS TO GUNS) Identified Victim: NONE REPORTED History of harm to others?: No Assessment of Violence: None Noted Violent Behavior Description: NA Does patient have access to weapons?: No Criminal Charges Pending?: No Does patient have a court date: No Is patient on probation?: No  Psychosis Hallucinations: None noted Delusions: None noted  Mental Status Report Appearance/Hygiene: In scrubs, Unremarkable Eye Contact: Fair Motor Activity: Freedom of movement Speech: Logical/coherent Level of Consciousness: Alert Mood: Depressed Affect: Depressed, Blunted Anxiety Level: Minimal Thought Processes: Coherent, Relevant Judgement: Partial Orientation: Person, Place, Time, Situation Obsessive Compulsive Thoughts/Behaviors: Minimal  Cognitive Functioning Concentration: Decreased Memory: Recent Intact, Remote Intact Is patient IDD: Yes(HX STS "COGNITIVE DEFICITS") Level of Function: UNKNOWN Is patient DD?: Unknown I IQ score available?: No Insight: see judgement above Appetite: Good Have you had any weight changes? : No Change Amount of the weight change? (lbs): (UNKNOWN) Sleep: Increased Total Hours of Sleep: 6(5=6) Vegetative Symptoms: None  ADLScreening [facility] Assessment Services) Patient's cognitive ability adequate to safely complete daily activities?: Yes Patient able to express need for assistance with ADLs?: Yes Independently performs ADLs?: Yes (appropriate for developmental age)  Prior Inpatient Therapy Prior Inpatient Therapy: Yes Prior Therapy Dates: MULTIPLE Prior Therapy Facilty/Provider(s): MULTIPLE: [place], [facility], [facility], [facility] Reason for  Treatment: SI, MDD  Prior Outpatient Therapy Prior Outpatient Therapy: No Prior Therapy Dates: CURRENT Prior Therapy Facilty/Provider(s): [facility] OR [facility] Reason for Treatment: MED MGMT & COUNSELING Does patient have an ACCT team?: No(CST AT [facility]) Does patient have Intensive In-House Services?  : No Does patient have [facility] services? : Yes Does patient have [facility] services?: No  ADL Screening (condition at time of admission) Patient's cognitive ability adequate to safely complete daily activities?: Yes Patient able to express need for assistance with ADLs?: Yes Independently performs ADLs?: Yes (appropriate for developmental age)       Abuse/Neglect Assessment (Assessment to be complete while patient is alone) Physical Abuse: Denies Verbal Abuse: Denies Sexual Abuse: Denies Exploitation of patient/patient's resources: Denies Self-Neglect: Denies     Regulatory affairs officer (For Healthcare) Does Patient Have a Medical Advance Directive?: No Would patient like information on creating a medical advance directive?: No - Patient declined          Disposition:  Disposition Initial Assessment Completed for this Encounter: Yes Patient referred to: (PENDING REVIEW WITH [facility] EXTENDER)  This service was provided via telemedicine using a 2-way, interactive audio and Radiographer, therapeutic.  Names of all persons participating in this telemedicine service and their role in this encounter. Name: Faylene Kurtz, MS, [facility],  CRC Role: Triage Specialist  Name: Akiva Josey Role: Patient  Name:  Role:   Name:  Role:    Consulted with Lindon Romp PA. Recommend IP admission. No appropriate beds currently available. Seek outside placement.  Spoke to [employer], PA at [facility] and advised of recommendation.   Faylene Kurtz, MS, CRC, [place] Triage Specialist [facility] T 09/12/2017 4:44 AM

## 2017-09-12 NOTE — ED Notes (Signed)
Telepsych computer at bedside

## 2017-09-12 NOTE — BH Assessment (Signed)
[place] Assessment Progress Note     [facility] on-call worker, Minna Antis, called and was advised of patient's situation.  He confirmed that he would be seeing her tomorrow and his team is working on group home placement for her.

## 2017-09-12 NOTE — ED Notes (Signed)
Patient given discharge instructions and belongings. Getting dressed in bathroom and being escorted out with security.

## 2017-09-12 NOTE — Consult Note (Signed)
   Tele Assessment   [name], 27 y.o., female patient presented to [facility] with complaints of suicidal ideation and plan to overdose.  Patient seen in ED with similar complaint and history of chronic suicidal ideation.  Patient seen via telepsych by TTS and  this provider; chart reviewed and consulted with Dr. Dwyane Dee on 09/12/17.  On evaluation [name] reports that she is feel better this morning and feels safe to go home.  Patient states that she is still interested in getting into a group home because she doesn't like living alone.  Patient reports that she has a meeting with her ACTT team tomorrow (Monday).  Informed patient that we would send her a list of the local group homes and to speak with ACTT team to help her find one.  During evaluation [name] is laying in bed.  She is alert/oriented x 4; calm/cooperative; and mood congruent with affect.  She does not appear to be responding to internal/external stimuli or delusional thoughts.  Patient denies suicidal/self-harm/homicidal ideation, psychosis, and paranoia.  Patient answered question appropriately.  Patient psychiatrically cleared.  TTS will contact patients ACTT team to pick patient up.     For detailed note see TTS assessment note.  Recommendations:  Send a list of local group homes and assisted living facilities.  Patient to follow up with current outpatient psychiatric provider and Follow up with ACTT team.    Disposition: No evidence of imminent risk to self or others at present.   Patient does not meet criteria for psychiatric inpatient admission.   Stacy Dupree B. Abrar Koone, NP

## 2017-09-12 NOTE — ED Notes (Signed)
Pt wanded by security. 

## 2017-09-12 NOTE — BH Assessment (Signed)
Patient states that she is scheduled to see her ACTT Team on Monday 5/20. TTS contacted the [facility] crisis line and left message to inform them of patient's ED visit and the fact that she is being discharged from the ED today. TTS requested a return phone call.  [facility] Conservation officer, nature (crisis phone [phone]).

## 2017-09-14 ENCOUNTER — Emergency Department (HOSPITAL_COMMUNITY)
Admission: EM | Admit: 2017-09-14 | Discharge: 2017-09-15 | Disposition: A | Discharge status: Home / Self Care | Payer: Medicaid Other | Attending: Emergency Medicine | Admitting: Emergency Medicine

## 2017-09-14 ENCOUNTER — Encounter (HOSPITAL_COMMUNITY): Payer: Self-pay | Admitting: Nurse Practitioner

## 2017-09-14 ENCOUNTER — Other Ambulatory Visit: Payer: Self-pay

## 2017-09-14 DIAGNOSIS — Z794 Long term (current) use of insulin: Secondary | ICD-10-CM | POA: Insufficient documentation

## 2017-09-14 DIAGNOSIS — I10 Essential (primary) hypertension: Secondary | ICD-10-CM | POA: Diagnosis not present

## 2017-09-14 DIAGNOSIS — F332 Major depressive disorder, recurrent severe without psychotic features: Secondary | ICD-10-CM | POA: Diagnosis not present

## 2017-09-14 DIAGNOSIS — F4325 Adjustment disorder with mixed disturbance of emotions and conduct: Secondary | ICD-10-CM | POA: Diagnosis not present

## 2017-09-14 DIAGNOSIS — F330 Major depressive disorder, recurrent, mild: Secondary | ICD-10-CM | POA: Diagnosis not present

## 2017-09-14 DIAGNOSIS — F17210 Nicotine dependence, cigarettes, uncomplicated: Secondary | ICD-10-CM | POA: Diagnosis not present

## 2017-09-14 DIAGNOSIS — F329 Major depressive disorder, single episode, unspecified: Secondary | ICD-10-CM | POA: Diagnosis present

## 2017-09-14 DIAGNOSIS — F29 Unspecified psychosis not due to a substance or known physiological condition: Secondary | ICD-10-CM | POA: Diagnosis not present

## 2017-09-14 DIAGNOSIS — E119 Type 2 diabetes mellitus without complications: Secondary | ICD-10-CM | POA: Diagnosis not present

## 2017-09-14 DIAGNOSIS — Z79899 Other long term (current) drug therapy: Secondary | ICD-10-CM | POA: Insufficient documentation

## 2017-09-14 LAB — I-STAT BETA HCG BLOOD, ED (MC, WL, AP ONLY)

## 2017-09-14 LAB — COMPREHENSIVE METABOLIC PANEL
ALBUMIN: 4.6 g/dL (ref 3.5–5.0)
ALT: 32 U/L (ref 14–54)
ANION GAP: 14 (ref 5–15)
AST: 34 U/L (ref 15–41)
Alkaline Phosphatase: 51 U/L (ref 38–126)
BUN: 11 mg/dL (ref 6–20)
CALCIUM: 10.1 mg/dL (ref 8.9–10.3)
CO2: 23 mmol/L (ref 22–32)
Chloride: 103 mmol/L (ref 101–111)
Creatinine, Ser: 0.8 mg/dL (ref 0.44–1.00)
GFR calc Af Amer: >60 mL/min (ref >60)
GFR calc non Af Amer: >60 mL/min (ref >60)
GLUCOSE: 226 mg/dL — AB (ref 65–99)
POTASSIUM: 3.7 mmol/L (ref 3.5–5.1)
SODIUM: 140 mmol/L (ref 135–145)
TOTAL PROTEIN: 8.4 g/dL — AB (ref 6.5–8.1)
Total Bilirubin: 0.5 mg/dL (ref 0.3–1.2)

## 2017-09-14 LAB — CBC WITH DIFFERENTIAL/PLATELET
BASOS ABS: 0.1 10*3/uL (ref 0.0–0.1)
Basophils Relative: 1 %
EOS ABS: 0.3 10*3/uL (ref 0.0–0.7)
EOS PCT: 3 %
HCT: 43.3 % (ref 36.0–46.0)
HEMOGLOBIN: 14.3 g/dL (ref 12.0–15.0)
Lymphocytes Relative: 46 %
Lymphs Abs: 4.5 10*3/uL — ABNORMAL HIGH (ref 0.7–4.0)
MCH: 27.1 pg (ref 26.0–34.0)
MCHC: 33 g/dL (ref 30.0–36.0)
MCV: 82 fL (ref 78.0–100.0)
MONO ABS: 0.7 10*3/uL (ref 0.1–1.0)
Monocytes Relative: 7 %
Neutro Abs: 4.2 10*3/uL (ref 1.7–7.7)
Neutrophils Relative %: 43 %
PLATELETS: 244 10*3/uL (ref 150–400)
RBC: 5.28 MIL/uL — AB (ref 3.87–5.11)
RDW: 13.9 % (ref 11.5–15.5)
WBC: 9.8 10*3/uL (ref 4.0–10.5)

## 2017-09-14 LAB — ETHANOL

## 2017-09-14 LAB — CBG MONITORING, ED
GLUCOSE-CAPILLARY: 294 mg/dL — AB (ref 65–99)
Glucose-Capillary: 282 mg/dL — ABNORMAL HIGH (ref 65–99)

## 2017-09-14 MED ORDER — INSULIN ASPART 100 UNIT/ML ~~LOC~~ SOLN
0.0000 [IU] | SUBCUTANEOUS | Status: DC
  Administered 2017-09-14 (×2): 8 [IU] via SUBCUTANEOUS
  Administered 2017-09-15: 5 [IU] via SUBCUTANEOUS
  Administered 2017-09-15: 8 [IU] via SUBCUTANEOUS
  Administered 2017-09-15: 5 [IU] via SUBCUTANEOUS
  Filled 2017-09-14 (×5): qty 1

## 2017-09-14 MED ORDER — BUSPIRONE HCL 10 MG PO TABS
15.0000 mg | ORAL_TABLET | Freq: Two times a day (BID) | ORAL | Status: DC
  Administered 2017-09-14 – 2017-09-15 (×2): 15 mg via ORAL
  Filled 2017-09-14 (×2): qty 2

## 2017-09-14 MED ORDER — ACETAMINOPHEN 325 MG PO TABS: 650.0000 mg | ORAL_TABLET | ORAL | Status: DC | PRN

## 2017-09-14 MED ORDER — ZIPRASIDONE MESYLATE 20 MG IM SOLR
10.0000 mg | Freq: Once | INTRAMUSCULAR | Status: AC
  Administered 2017-09-14: 10 mg via INTRAMUSCULAR
  Filled 2017-09-14: qty 20

## 2017-09-14 MED ORDER — ALUM & MAG HYDROXIDE-SIMETH 200-200-20 MG/5ML PO SUSP: 30.0000 mL | Freq: Four times a day (QID) | ORAL | Status: DC | PRN

## 2017-09-14 MED ORDER — HYDROXYZINE HCL 25 MG PO TABS
50.0000 mg | ORAL_TABLET | Freq: Four times a day (QID) | ORAL | Status: DC | PRN
  Administered 2017-09-14: 50 mg via ORAL
  Filled 2017-09-14: qty 2

## 2017-09-14 MED ORDER — STERILE WATER FOR INJECTION IJ SOLN
INTRAMUSCULAR | Status: AC
  Administered 2017-09-14: 16:00:00
  Filled 2017-09-14: qty 10

## 2017-09-14 MED ORDER — ONDANSETRON HCL 4 MG PO TABS: 4.0000 mg | ORAL_TABLET | Freq: Three times a day (TID) | ORAL | Status: DC | PRN

## 2017-09-14 MED ORDER — LAMOTRIGINE 25 MG PO TABS
25.0000 mg | ORAL_TABLET | Freq: Every day | ORAL | Status: DC
  Administered 2017-09-14: 25 mg via ORAL
  Filled 2017-09-14: qty 1

## 2017-09-14 MED ORDER — DULOXETINE HCL 20 MG PO CPEP
20.0000 mg | ORAL_CAPSULE | Freq: Two times a day (BID) | ORAL | Status: DC
  Administered 2017-09-14 – 2017-09-15 (×2): 20 mg via ORAL
  Filled 2017-09-14 (×2): qty 1

## 2017-09-14 NOTE — ED Notes (Signed)
Report given to RN

## 2017-09-14 NOTE — ED Triage Notes (Signed)
Patient stating she is SI and she is repeating things over and over. Patient is yelling and demanding things.

## 2017-09-14 NOTE — ED Notes (Signed)
Patient to room 34, alert and oriented demanding a [place] sandwich.

## 2017-09-14 NOTE — ED Notes (Signed)
TTS at bedside. 

## 2017-09-14 NOTE — ED Notes (Signed)
Patient at desk tapping on window seal and demanding a [place] sandwich after given a snack already. Patient states " What's taking so long to get my [place] sandwich I'm going to my room but I'll be back in 5 minutes to worry you until I get my sandwich". Encouragement and support provided and safety maintain. Q 15 min safety checks remain in place and video monitoring.

## 2017-09-14 NOTE — ED Notes (Signed)
Waiting for meal tray before giving patient her insulin.

## 2017-09-14 NOTE — ED Provider Notes (Signed)
[place] DEPT Provider Note   CSN: [id number] Arrival date & time: 09/14/17  1414     History   Chief Complaint No chief complaint on file.   HPI [name] is a 27 y.o. female.  The history is provided by the patient.  Mental Health Problem  Presenting symptoms: aggressive behavior, agitation, bizarre behavior, self-mutilation, suicidal thoughts and suicidal threats   Degree of incapacity (severity):  Moderate Onset quality:  Gradual Timing:  Constant Progression:  Worsening Chronicity:  New Treatment compliance:  Unable to specify Relieved by:  Nothing Worsened by:  Nothing Associated symptoms: feelings of worthlessness, irritability and poor judgment   Associated symptoms: no abdominal pain   Risk factors: hx of mental illness   Pt screaming (I don't want to shave my head anymore)  Pt throwing things in room.  Pt threw bread crust at me.    Past Medical History:  Diagnosis Date  . Anxiety   . Bipolar 1 disorder ([place])   . Cognitive deficits   . Depression   . Diabetes mellitus without complication ([place])   . Hypertension   . Mental disorder   . Obesity     Patient Active Problem List   Diagnosis Date Noted  . 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 LGSIL on Pap smear of cervix 02/01/2017  . OCD (obsessive compulsive disorder) 10/05/2016  . Major depressive disorder, recurrent episode, mild ([place]) 05/04/2016  . 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 Learning disability 07/18/2015  . 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 Diabetes mellitus ([place]) 07/18/2015  . MDD (major depressive disorder), recurrent, severe, with psychosis ([place]) 07/18/2015  . Hyperlipidemia 07/18/2015  . Severe episode of recurrent major depressive disorder, without psychotic features ([place])   . Drug overdose   . Cognitive deficits 10/12/2012  . Generalized anxiety disorder 06/28/2012    Past Surgical  History:  Procedure Laterality Date  . CESAREAN SECTION    . CESAREAN SECTION N/A 04/25/2013   Procedure: REPEAT CESAREAN SECTION;  Surgeon: [name], MD;  Location: [facility] [facility];  Service: Obstetrics;  Laterality: N/A;  . MASS EXCISION N/A 06/03/2012   Procedure: EXCISION MASS;  Surgeon: [name], MD;  Location: [place];  Service: ENT;  Laterality: N/A;  Excision uvula mass  . TONSILLECTOMY N/A 06/03/2012   Procedure: TONSILLECTOMY;  Surgeon: [name], MD;  Location: [place];  Service: ENT;  Laterality: N/A;  . TONSILLECTOMY       OB History    Gravida  3   Para  3   Term  3   Preterm  0   AB  0   Living  3     SAB  0   TAB  0   Ectopic  0   Multiple  0   Live Births  3            Home Medications    Prior to Admission medications   Medication Sig Start Date End Date Taking? Authorizing Provider  busPIRone (BUSPAR) 15 MG tablet Take 1 tablet (15 mg total) by mouth 2 (two) times daily. For anxiety Patient taking differently: Take 15 mg by mouth 3 (three) times daily. For anxiety 10/06/16   Lindell Spar I, NP  DULoxetine (CYMBALTA) 20 MG capsule Take 20 mg by mouth 2 (two) times daily.    [provider]  glipiZIDE (GLUCOTROL XL) 10 MG 24 hr tablet Take 1 tablet (10  mg total) by mouth every evening. For diabetes management Patient taking differently: Take 10 mg by mouth 2 (two) times daily. For diabetes management 10/06/16   Lindell Spar I, NP  hydrochlorothiazide (HYDRODIURIL) 25 MG tablet Take 1 TABLET BY MOUTH EVERY DAY for 30 days 02/15/17   [provider]  lamoTRIgine (LAMICTAL) 25 MG tablet Take 25 mg by mouth at bedtime. 09/01/17   [provider]  LEVEMIR FLEXTOUCH 100 UNIT/ML Pen Inject 40 units 2 times a day 02/10/17   [provider]  paliperidone (INVEGA SUSTENNA) 234 MG/1.5ML SUSP injection Inject 234 mg every 30 (thirty) days into the muscle.    [provider]     Family History Family History  Problem Relation Age of Onset  . Hypertension Mother   . Diabetes Father     Social History Social History   Tobacco Use  . Smoking status: Current Every Day Smoker    Packs/day: 1.00    Years: 11.00    Pack years: 11.00    Types: Cigarettes  . Smokeless tobacco: Never Used  Substance Use Topics  . Alcohol use: No  . Drug use: No    Comment: Patient denies     Allergies   Wellbutrin [bupropion]; Omnipaque [iohexol]; and Penicillins   Review of Systems Review of Systems  Constitutional: Positive for irritability.  Gastrointestinal: Negative for abdominal pain.  Psychiatric/Behavioral: Positive for agitation, self-injury and suicidal ideas.  All other systems reviewed and are negative.    Physical Exam Updated Vital Signs BP 121/83 (BP Location: Right Arm)   Pulse 83   Temp 98.5 F (36.9 C) (Oral)   Resp 19   Ht 5\' 7"  (1.702 m)   Wt 113.9 kg (251 lb)   SpO2 96%   BMI 39.31 kg/m   Physical Exam  Constitutional: She appears well-developed and well-nourished.  HENT:  Head: Normocephalic.  Right Ear: External ear normal.  Left Ear: External ear normal.  Nose: Nose normal.  Mouth/Throat: Oropharynx is clear and moist.  Eyes: Pupils are equal, round, and reactive to light. Conjunctivae and EOM are normal.  Cardiovascular: Normal rate.  Pulmonary/Chest: Effort normal.  Abdominal: Soft.  Musculoskeletal: Normal range of motion.  Neurological: She is alert.  Skin: Skin is warm.  Psychiatric:  Pt seems acutely psychotic   Nursing note and vitals reviewed.    ED Treatments / Results  Labs (all labs ordered are listed, but only abnormal results are displayed) Labs Reviewed  COMPREHENSIVE METABOLIC PANEL  ETHANOL  RAPID URINE DRUG SCREEN, HOSP PERFORMED  CBC WITH DIFFERENTIAL/PLATELET  I-STAT BETA HCG BLOOD, ED ([facility], [facility], [facility] ONLY)    EKG None  Radiology No results found.  Procedures Procedures (including  critical care time)  Medications Ordered in ED Medications  ziprasidone (GEODON) injection 10 mg (has no administration in time range)     Initial Impression / Assessment and Plan / ED Course  I have reviewed the triage vital signs and the nursing notes.  Pertinent labs & imaging results that were available during my care of the patient were reviewed by me and considered in my medical decision making (see chart for details).     MDM  Due to pt acute aggressive state, Pt given geodon 10 IM.    TTS to consult   Final Clinical Impressions(s) / ED Diagnoses   Final diagnoses:  Psychosis, unspecified psychosis type [facility])    ED Discharge Orders    None       [place],  Hollace Kinnier, PA-C 09/14/17 1611    Daleen Bo, MD 09/14/17 [phone]

## 2017-09-14 NOTE — ED Notes (Signed)
Pt wanded by security, ambulatory to psych.

## 2017-09-14 NOTE — ED Notes (Signed)
Meal tray late.  Insulin held until meal arrives.

## 2017-09-14 NOTE — BH Assessment (Signed)
Assessment Note  [name] is a 27 y.o. female who presents to ED due to her chronic SI and OCD. Pt is combative upon arrival and had to be chemically restrained. Clinician able to assess pt prior to restraint. Pt reports SI. Pt acknowledges that she has SI all the time, every day. Pt also shares that she needs help with her OCD. She says she's angry b/c she keeps shaving her head, due to the OCD. Pt says she wants medication to get her to stop shaving her head. Clinician processed with pt regarding medication not being the panacea to her OCD and she would need to put some efforts into mitigating her OCD symptoms, also. Pt shared that she sees a therapist at [facility] who has been giving her coping skills, such as sucking on a lemon and taking a cold shower. Pt also reports wanting to go to a group home. Pt acknowledges that [facility] is trying to help her with this, but says that she keeps changing her mind.   Despite pt acknowledging all of the interventions she has in place for all of her complaints, pt continues to lack insight that she doesn't need hospitalization and is demanding that she gets help for her OCD.   Case staffed with Jinny Blossom, NP, who recommends that pt be observed overnight and re-assessed by psychiatrist in the morning for final disposition.   Diagnosis: F33.2 MDD, Recurrent, Severe   Past Medical History:  Past Medical History:  Diagnosis Date  . Anxiety   . Bipolar 1 disorder ([place])   . Cognitive deficits   . Depression   . Diabetes mellitus without complication ([place])   . Hypertension   . Mental disorder   . Obesity     Past Surgical History:  Procedure Laterality Date  . CESAREAN SECTION    . CESAREAN SECTION N/A 04/25/2013   Procedure: REPEAT CESAREAN SECTION;  Surgeon: [name], MD;  Location: [facility];  Service: Obstetrics;  Laterality: N/A;  . MASS EXCISION N/A 06/03/2012   Procedure: EXCISION MASS;  Surgeon: [name], MD;  Location: [facility];  Service: ENT;  Laterality: N/A;  Excision uvula mass  . TONSILLECTOMY N/A 06/03/2012   Procedure: TONSILLECTOMY;  Surgeon: [name], MD;  Location: [place] [facility];  Service: ENT;  Laterality: N/A;  . TONSILLECTOMY      Family History:  Family History  Problem Relation Age of Onset  . Hypertension Mother   . Diabetes Father     Social History:  reports that she has been smoking cigarettes.  She has a 11.00 pack-year smoking history. She has never used smokeless tobacco. She reports that she does not drink alcohol or use drugs.  Additional Social History:  Alcohol / Drug Use Pain Medications: see PTA meds Prescriptions: see PTA meds Over the Counter: see PTA meds  CIWA: CIWA-Ar BP: 121/83 Pulse Rate: 83 COWS:    Allergies:  Allergies  Allergen Reactions  . Wellbutrin [Bupropion] Shortness Of Breath  . Omnipaque [Iohexol] Swelling and Other (See Comments)    Reaction:  Eye swelling  . Penicillins Hives and Other (See Comments)    Has patient had a PCN reaction causing immediate rash, facial/tongue/throat swelling, SOB or lightheadedness with hypotension: No Has patient had a PCN reaction causing severe rash involving mucus membranes or skin necrosis: No Has patient had a PCN reaction that required hospitalization No Has patient had a PCN reaction occurring within the last 10 years: No If  all of the above answers are "NO", then may proceed with Cephalosporin use.    Home Medications:  (Not in a hospital admission)  OB/GYN Status:  No LMP recorded. Patient has had an implant.  General Assessment Data Location of Assessment: [facility] TTS Assessment: In system Is this a Tele or Face-to-Face Assessment?: Face-to-Face Is this an Initial Assessment or a Re-assessment for this encounter?: Initial Assessment Marital status: Single Is patient pregnant?: No Pregnancy Status: No Living Arrangements: Alone Can pt return to current living  arrangement?: Yes Admission Status: Voluntary Is patient capable of signing voluntary admission?: Yes Referral Source: Self/Family/Friend     Crisis Care Plan Living Arrangements: Alone Name of Psychiatrist: [facility] Name of Therapist: [facility]  Education Status Is patient currently in school?: No Highest grade of school patient has completed: 11 Is the patient employed, unemployed or receiving disability?: Receiving disability income  Risk to self with the past 6 months Suicidal Ideation: Yes-Currently Present Has patient been a risk to self within the past 6 months prior to admission? : Yes Suicidal Intent: Yes-Currently Present Has patient had any suicidal intent within the past 6 months prior to admission? : Yes Is patient at risk for suicide?: Yes Suicidal Plan?: No-Not Currently/Within Last 6 Months Has patient had any suicidal plan within the past 6 months prior to admission? : Yes Specify Current Suicidal Plan: OD on pills Access to Means: Yes Specify Access to Suicidal Means: rx meds Previous Attempts/Gestures: Yes How many times?: 4 Triggers for Past Attempts: Unpredictable Intentional Self Injurious Behavior: Damaging Comment - Self Injurious Behavior: shaving head or pulling out her eyebrows Family Suicide History: No Recent stressful life event(s): Other (Comment) Persecutory voices/beliefs?: Yes Depression: Yes Depression Symptoms: Feeling angry/irritable, Despondent, Insomnia Substance abuse history and/or treatment for substance abuse?: No Suicide prevention information given to non-admitted patients: Not applicable  Risk to Others within the past 6 months Homicidal Ideation: No Does patient have any lifetime risk of violence toward others beyond the six months prior to admission? : No Thoughts of Harm to Others: No Current Homicidal Intent: No Current Homicidal Plan: No Access to Homicidal Means: No History of harm to others?: No Assessment of  Violence: None Noted Does patient have access to weapons?: No Criminal Charges Pending?: No Does patient have a court date: No Is patient on probation?: No  Psychosis Hallucinations: None noted Delusions: None noted  Mental Status Report Appearance/Hygiene: In scrubs, Unremarkable Eye Contact: Good Motor Activity: Unremarkable Speech: Logical/coherent Level of Consciousness: Alert Mood: Irritable Affect: Appropriate to circumstance Anxiety Level: Moderate Thought Processes: Coherent, Relevant Judgement: Partial Orientation: Person, Place, Time, Situation Obsessive Compulsive Thoughts/Behaviors: Severe  Cognitive Functioning Concentration: Decreased Memory: Recent Intact, Remote Intact Is patient IDD: Yes Level of Function: high functioning Is patient DD?: No I IQ score available?: No Insight: see judgement above Impulse Control: Fair Appetite: Good Have you had any weight changes? : No Change Sleep: No Change Vegetative Symptoms: None  ADLScreening [facility] Services) Patient's cognitive ability adequate to safely complete daily activities?: Yes Patient able to express need for assistance with ADLs?: Yes Independently performs ADLs?: Yes (appropriate for developmental age)  Prior Inpatient Therapy Prior Inpatient Therapy: Yes Prior Therapy Dates: MULTIPLE Prior Therapy Facilty/Provider(s): MULTIPLE: [facility] [place] [facility], [facility], [facility], [facility] Reason for Treatment: SI, MDD  Prior Outpatient Therapy Prior Outpatient Therapy: No Prior Therapy Dates: CURRENT Prior Therapy Facilty/Provider(s): [facility] Reason for Treatment: MED MGMT & COUNSELING Does patient have an ACCT team?: Yes Does patient  have Intensive In-House Services?  : No Does patient have [facility] services? : Yes Does patient have P4CC services?: Unknown  ADL Screening (condition at time of admission) Patient's cognitive ability adequate to safely complete daily activities?: Yes Is the patient deaf or have  difficulty hearing?: No Does the patient have difficulty seeing, even when wearing glasses/contacts?: No Does the patient have difficulty concentrating, remembering, or making decisions?: No Patient able to express need for assistance with ADLs?: Yes Does the patient have difficulty dressing or bathing?: No Independently performs ADLs?: Yes (appropriate for developmental age) Does the patient have difficulty walking or climbing stairs?: No Weakness of Legs: None Weakness of Arms/Hands: None  Home Assistive Devices/Equipment Home Assistive Devices/Equipment: None    Abuse/Neglect Assessment (Assessment to be complete while patient is alone) Physical Abuse: Denies Verbal Abuse: Denies Sexual Abuse: Denies Exploitation of patient/patient's resources: Denies Self-Neglect: Denies Values / Beliefs Cultural Requests During Hospitalization: None Spiritual Requests During Hospitalization: None   Advance Directives (For Healthcare) Does Patient Have a Medical Advance Directive?: No Would patient like information on creating a medical advance directive?: No - Patient declined          Disposition:  Disposition Initial Assessment Completed for this Encounter: Yes  On Site Evaluation by:   Reviewed with Physician:    Rexene Edison 09/14/2017 4:04 PM

## 2017-09-15 DIAGNOSIS — F330 Major depressive disorder, recurrent, mild: Secondary | ICD-10-CM

## 2017-09-15 DIAGNOSIS — F29 Unspecified psychosis not due to a substance or known physiological condition: Secondary | ICD-10-CM | POA: Insufficient documentation

## 2017-09-15 DIAGNOSIS — F17210 Nicotine dependence, cigarettes, uncomplicated: Secondary | ICD-10-CM

## 2017-09-15 DIAGNOSIS — F259 Schizoaffective disorder, unspecified: Secondary | ICD-10-CM | POA: Insufficient documentation

## 2017-09-15 LAB — CBG MONITORING, ED
GLUCOSE-CAPILLARY: 244 mg/dL — AB (ref 65–99)
Glucose-Capillary: 221 mg/dL — ABNORMAL HIGH (ref 65–99)
Glucose-Capillary: 277 mg/dL — ABNORMAL HIGH (ref 65–99)

## 2017-09-15 NOTE — BH Assessment (Addendum)
[place] Assessment Progress Note  Per Ambrose Finland, MD, this pt does not require psychiatric hospitalization at this time.  Pt is to be discharged from [facility] with recommendation to continue treatment with the [facility].  This has been included in pt's discharge instructions.  Pt's nurse, Narda Rutherford, has been notified.  Jalene Mullet, MA Triage Specialist [phone]  Addendum:  At 11:18 this writer called [facility] and spoke to Rob, informing him that pt has been psychiatrically cleared, and is to be discharged by 14:00 today, asking if they would be able to pick pt up.  He informs me that pt does not receive ACT Team services from them, but does receive Conservation officer, nature (CST) services.  He took my call back number and agrees to inform their CST.  Narda Rutherford has been updated.  Jalene Mullet, [place] Coordinator [phone]

## 2017-09-15 NOTE — ED Notes (Signed)
EDP came to see patient and she continues to escalate.  Pt screaming and throwing items in room.  Pt noted to be screaming "I need help.  I don't want to shave my head."  Will notify EDP.

## 2017-09-15 NOTE — ED Notes (Signed)
Pt alert/oriented, denies si/hi/avh on dc.  Written dc instructions reviewed with patient.  Pt encouraged to take her medications as directed and follow up with her OP providers and seek treatment for changes.  Pt verbalized understanding.

## 2017-09-15 NOTE — ED Notes (Signed)
Patient has not had any outbursts of anger during this shift.

## 2017-09-15 NOTE — ED Notes (Signed)
Pt is aware that her community team has been contacted about picking her up and that she will be dc'd [facility] [address] if they have not arrived.

## 2017-09-15 NOTE — Discharge Instructions (Signed)
For your behavioral health needs, you are advised to continue treatment with the [facility]: ° °     [facility] °     [facility], [address] °     [address] °     [place], [place]  [address] °     [phone] °     Crisis number: [phone] °

## 2017-09-15 NOTE — ED Notes (Signed)
Dr Louretta Shorten and Margarita Grizzle NP into see.  Pt denies si/hi/avh/depression or anxiety. at this time and reports that she cme to the ED because she "want to move to a group home."  Pt reports that she currently lives alone and gets "bord."  Pt reports that her ACT team is currently trying to place her in a group home.  Pt reports that she has problems with anxiety and that the medication that she was given last night helped and is requesting to be dc'd on it.

## 2017-09-15 NOTE — Consult Note (Addendum)
[facility] Consult   Reason for Consult:  Suicidal ideation Referring Physician:  EDP Patient Identification: [name] MRN:  [id number] Principal Diagnosis: Major depressive disorder, recurrent episode, mild ([place]) Diagnosis:   Patient Active Problem List   Diagnosis Date Noted  . Psychosis ([place]) [F29]   . Adjustment disorder with mixed disturbance of emotions and conduct [F43.25] 08/03/2017  . LGSIL on Pap smear of cervix [R87.612] 02/01/2017  . OCD (obsessive compulsive disorder) [F42.9] 10/05/2016  . Major depressive disorder, recurrent episode, mild ([place]) [F33.0] 05/04/2016  . 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 Learning disability [F81.9] 07/18/2015  . 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 Diabetes mellitus ([place]) [E11.9] 07/18/2015  . MDD (major depressive disorder), recurrent, severe, with psychosis ([place]) [F33.3] 07/18/2015  . Hyperlipidemia [E78.5] 07/18/2015  . Severe episode of recurrent major depressive disorder, without psychotic features ([place]) [F33.2]   . Drug overdose [T50.901A]   . Cognitive deficits [R41.89] 10/12/2012  . Generalized anxiety disorder [F41.1] 06/28/2012    Total Time spent with patient: 45 minutes  Subjective:   [name] is a 27 y.o. female patient admitted with chronic suicidal ideation.  HPI: Pt was seen and chart reviewed with treatment team and Dr Octavio Graves. Pt denies suicidal/homicidal ideation, denies auditory/visual hallucinations and does not appear to be responding to internal stimuli. Pt stated she came to the hospital because she wants to be in a group home because she lives alone and it is boring. Pt is well known to this hospital system and frequents the ED's when she wants attention. Pt verbalizes that she is suicidal so she can gain admission and be around people. Pt has community care services through [facility]. Pt also has a history of cognitive deficits. Pt is stable and  psychiatrically clear for discharge.   Past Psychiatric History: As above  Risk to Self:None Risk to Others: None Prior Inpatient Therapy: Prior Inpatient Therapy: Yes Prior Therapy Dates: MULTIPLE Prior Therapy Facilty/Provider(s): MULTIPLE: [facility], [facility], [facility], [facility] Reason for Treatment: SI, MDD Prior Outpatient Therapy: Prior Outpatient Therapy: No Prior Therapy Dates: CURRENT Prior Therapy Facilty/Provider(s): [facility] OR [facility] Reason for Treatment: MED MGMT & COUNSELING Does patient have an ACCT team?: Yes Does patient have Intensive In-House Services?  : No Does patient have [facility] services? : Yes Does patient have [facility] services?: Unknown  Past Medical History:  Past Medical History:  Diagnosis Date  . Anxiety   . Bipolar 1 disorder ([place])   . Cognitive deficits   . Depression   . Diabetes mellitus without complication ([place])   . Hypertension   . Mental disorder   . Obesity     Past Surgical History:  Procedure Laterality Date  . CESAREAN SECTION    . CESAREAN SECTION N/A 04/25/2013   Procedure: REPEAT CESAREAN SECTION;  Surgeon: [name], MD;  Location: [facility];  Service: Obstetrics;  Laterality: N/A;  . MASS EXCISION N/A 06/03/2012   Procedure: EXCISION MASS;  Surgeon: [name], MD;  Location: [facility];  Service: ENT;  Laterality: N/A;  Excision uvula mass  . TONSILLECTOMY N/A 06/03/2012   Procedure: TONSILLECTOMY;  Surgeon: [name], MD;  Location: [facility];  Service: ENT;  Laterality: N/A;  . TONSILLECTOMY     Family History:  Family History  Problem Relation Age of Onset  . Hypertension Mother   . Diabetes Father    Family Psychiatric  History: Unknown Social History:  Social History   Substance and Sexual  Activity  Alcohol Use No     Social History   Substance and Sexual Activity  Drug Use No   Comment: Patient denies    Social History   Socioeconomic History  . Marital status: Single    Spouse name:  Not on file  . Number of children: Not on file  . Years of education: Not on file  . Highest education level: Not on file  Occupational History  . Not on file  Social Needs  . Financial resource strain: Not on file  . Food insecurity:    Worry: Not on file    Inability: Not on file  . Transportation needs:    Medical: Not on file    Non-medical: Not on file  Tobacco Use  . Smoking status: Current Every Day Smoker    Packs/day: 1.00    Years: 11.00    Pack years: 11.00    Types: Cigarettes  . Smokeless tobacco: Never Used  Substance and Sexual Activity  . Alcohol use: No  . Drug use: No    Comment: Patient denies  . Sexual activity: Yes    Birth control/protection: Implant  Lifestyle  . Physical activity:    Days per week: Not on file    Minutes per session: Not on file  . Stress: Not on file  Relationships  . Social connections:    Talks on phone: Not on file    Gets together: Not on file    Attends religious service: Not on file    Active member of club or organization: Not on file    Attends meetings of clubs or organizations: Not on file    Relationship status: Not on file  Other Topics Concern  . Not on file  Social History Narrative  . Not on file   Additional Social History:    Allergies:   Allergies  Allergen Reactions  . Wellbutrin [Bupropion] Shortness Of Breath  . Omnipaque [Iohexol] Swelling and Other (See Comments)    Reaction:  Eye swelling  . Penicillins Hives and Other (See Comments)    Has patient had a PCN reaction causing immediate rash, facial/tongue/throat swelling, SOB or lightheadedness with hypotension: No Has patient had a PCN reaction causing severe rash involving mucus membranes or skin necrosis: No Has patient had a PCN reaction that required hospitalization No Has patient had a PCN reaction occurring within the last 10 years: No If all of the above answers are "NO", then may proceed with Cephalosporin use.  . Depakote Er  [Divalproex Sodium Er] Nausea And Vomiting    Labs:  Results for orders placed or performed during the hospital encounter of 09/14/17 (from the past 48 hour(s))  Comprehensive metabolic panel     Status: Abnormal   Collection Time: 09/14/17  3:45 PM  Result Value Ref Range   Sodium 140 135 - 145 mmol/L   Potassium 3.7 3.5 - 5.1 mmol/L   Chloride 103 101 - 111 mmol/L   CO2 23 22 - 32 mmol/L   Glucose, Bld 226 (H) 65 - 99 mg/dL   BUN 11 6 - 20 mg/dL   Creatinine, Ser 0.80 0.44 - 1.00 mg/dL   Calcium 10.1 8.9 - 10.3 mg/dL   Total Protein 8.4 (H) 6.5 - 8.1 g/dL   Albumin 4.6 3.5 - 5.0 g/dL   AST 34 15 - 41 U/L   ALT 32 14 - 54 U/L   Alkaline Phosphatase 51 38 - 126 U/L   Total Bilirubin 0.5  0.3 - 1.2 mg/dL   GFR calc non Af Amer >60 >60 mL/min   GFR calc Af Amer >60 >60 mL/min    Comment: (NOTE) The eGFR has been calculated using the CKD EPI equation. This calculation has not been validated in all clinical situations. eGFR's persistently <60 mL/min signify possible Chronic Kidney Disease.    Anion gap 14 5 - 15    Comment: Performed at [facility], [place] [address], [place], [place] [address]  Ethanol     Status: None   Collection Time: 09/14/17  3:45 PM  Result Value Ref Range   Alcohol, Ethyl (B) <10 <10 mg/dL    Comment: (NOTE) Lowest detectable limit for serum alcohol is 10 mg/dL. For medical purposes only. Performed at [facility], [place] [address] [place], [place] [address]   CBC with Diff     Status: Abnormal   Collection Time: 09/14/17  3:45 PM  Result Value Ref Range   WBC 9.8 4.0 - 10.5 K/uL   RBC 5.28 (H) 3.87 - 5.11 MIL/uL   Hemoglobin 14.3 12.0 - 15.0 g/dL   HCT 43.3 36.0 - 46.0 %   MCV 82.0 78.0 - 100.0 fL   MCH 27.1 26.0 - 34.0 pg   MCHC 33.0 30.0 - 36.0 g/dL   RDW 13.9 11.5 - 15.5 %   Platelets 244 150 - 400 K/uL   Neutrophils Relative % 43 %   Neutro Abs 4.2 1.7 - 7.7 K/uL   Lymphocytes Relative 46 %   Lymphs  Abs 4.5 (H) 0.7 - 4.0 K/uL   Monocytes Relative 7 %   Monocytes Absolute 0.7 0.1 - 1.0 K/uL   Eosinophils Relative 3 %   Eosinophils Absolute 0.3 0.0 - 0.7 K/uL   Basophils Relative 1 %   Basophils Absolute 0.1 0.0 - 0.1 K/uL    Comment: Performed at [facility], [place] [address], [place], [place] [address]  I-Stat beta hCG blood, ED     Status: None   Collection Time: 09/14/17  3:46 PM  Result Value Ref Range   I-stat hCG, quantitative <5.0 <5 mIU/mL   Comment 3            Comment:   GEST. AGE      CONC.  (mIU/mL)   <=1 WEEK        5 - 50     2 WEEKS       50 - 500     3 WEEKS       100 - 10,000     4 WEEKS     1,000 - 30,000        FEMALE AND NON-PREGNANT FEMALE:     LESS THAN 5 mIU/mL   CBG monitoring, ED     Status: Abnormal   Collection Time: 09/14/17  5:07 PM  Result Value Ref Range   Glucose-Capillary 294 (H) 65 - 99 mg/dL   Comment 1 Notify RN   CBG monitoring, ED     Status: Abnormal   Collection Time: 09/14/17  7:58 PM  Result Value Ref Range   Glucose-Capillary 282 (H) 65 - 99 mg/dL  CBG monitoring, ED     Status: Abnormal   Collection Time: 09/15/17  4:35 AM  Result Value Ref Range   Glucose-Capillary 221 (H) 65 - 99 mg/dL  CBG monitoring, ED     Status: Abnormal   Collection Time: 09/15/17  8:32 AM  Result Value Ref Range  Glucose-Capillary 244 (H) 65 - 99 mg/dL   Comment 1 Notify RN     Current Facility-Administered Medications  Medication Dose Route Frequency Provider Last Rate Last Dose  . acetaminophen (TYLENOL) tablet 650 mg  650 mg Oral Q4H PRN Caryl Ada K, PA-C      . alum & mag hydroxide-simeth (MAALOX/MYLANTA) 200-200-20 MG/5ML suspension 30 mL  30 mL Oral Q6H PRN Caryl Ada K, PA-C      . busPIRone (BUSPAR) tablet 15 mg  15 mg Oral BID Sofia, Leslie K, PA-C   15 mg at 09/15/17 1015  . DULoxetine (CYMBALTA) DR capsule 20 mg  20 mg Oral BID Sofia, Leslie K, PA-C   20 mg at 09/15/17 1016  . hydrOXYzine (ATARAX/VISTARIL)  tablet 50 mg  50 mg Oral Q6H PRN Laverle Hobby, PA-C   50 mg at 09/14/17 2026  . insulin aspart (novoLOG) injection 0-15 Units  0-15 Units Subcutaneous Q4H Fransico Meadow, [place]   5 Units at 09/15/17 7591  . lamoTRIgine (LAMICTAL) tablet 25 mg  25 mg Oral QHS Fransico Meadow, [place]   25 mg at 09/14/17 2124  . ondansetron (ZOFRAN) tablet 4 mg  4 mg Oral Q8H PRN Fransico Meadow, [place]       Current Outpatient Medications  Medication Sig Dispense Refill  . busPIRone (BUSPAR) 15 MG tablet Take 1 tablet (15 mg total) by mouth 2 (two) times daily. For anxiety (Patient taking differently: Take 15 mg by mouth 3 (three) times daily. For anxiety) 60 tablet 0  . DULoxetine (CYMBALTA) 20 MG capsule Take 20 mg by mouth 2 (two) times daily.    . fluvoxaMINE (LUVOX) 100 MG tablet Take 100 mg by mouth 3 (three) times daily.    Marland Kitchen glipiZIDE (GLUCOTROL) 10 MG tablet Take 10 mg by mouth 2 (two) times daily before a meal.    . hydrochlorothiazide (HYDRODIURIL) 25 MG tablet Take 1 TABLET BY MOUTH EVERY DAY for 30 days  1  . insulin detemir (LEVEMIR) 100 UNIT/ML injection Inject 40 Units into the skin 2 (two) times daily.    Marland Kitchen lamoTRIgine (LAMICTAL) 25 MG tablet Take 25 mg by mouth at bedtime.  0  . paliperidone (INVEGA SUSTENNA) 234 MG/1.5ML SUSP injection Inject 234 mg every 30 (thirty) days into the muscle.    Marland Kitchen glipiZIDE (GLUCOTROL XL) 10 MG 24 hr tablet Take 1 tablet (10 mg total) by mouth every evening. For diabetes management (Patient not taking: Reported on 09/14/2017) 30 tablet 0    Musculoskeletal: Strength & Muscle Tone: within normal limits Gait & Station: normal Patient leans: N/A  Psychiatric Specialty Exam: Physical Exam  Constitutional: She is oriented to person, place, and time. She appears well-developed and well-nourished.  HENT:  Head: Normocephalic.  Respiratory: Effort normal.  Musculoskeletal: Normal range of motion.  Neurological: She is alert and oriented to person, place, and time.   Psychiatric: Her speech is normal and behavior is normal. Thought content normal. Cognition and memory are normal. She expresses impulsivity. She exhibits a depressed mood.    Review of Systems  Psychiatric/Behavioral: Positive for depression. Negative for hallucinations, memory loss, substance abuse and suicidal ideas. The patient is not nervous/anxious and does not have insomnia.   All other systems reviewed and are negative.   Blood pressure 130/77, pulse 92, temperature 97.7 F (36.5 C), temperature source Axillary, resp. rate 12, height _0  (1.702 m), weight 251 lb (113.9 kg), SpO2 96 %.Body mass index is 39.31 kg/m.  General Appearance: Casual  Eye Contact:  Good  Speech:  Clear and Coherent and Normal Rate  Volume:  Normal  Mood:  Depressed  Affect:  Congruent  Thought Process:  Coherent, Goal Directed and Linear  Orientation:  Full (Time, Place, and Person)  Thought Content:  Logical  Suicidal Thoughts:  No  Homicidal Thoughts:  No  Memory:  Immediate;   Good Recent;   Good Remote;   Fair  Judgement:  Fair  Insight:  Shallow  Psychomotor Activity:  Normal  Concentration:  Concentration: Good and Attention Span: Good  Recall:  Good  Fund of Knowledge:  Fair  Language:  Good  Akathisia:  No  Handed:  Right  AIMS (if indicated):     Assets:  Agricultural consultant Housing Social Support  ADL's:  Intact  Cognition:  WNL  Sleep:   Good     Treatment Plan Summary: Plan Major depressive disorder, recurrent episode, mild ([place])  Discharge Home Take all medications as prescribed Follow up with [facility] community care team   Disposition: No evidence of imminent risk to self or others at present.   Patient does not meet criteria for psychiatric inpatient admission. Supportive therapy provided about ongoing stressors. Discussed crisis plan, support from social network, calling 911, coming to the Emergency Department, and calling Suicide  Hotline.  [name], NP 09/15/2017 11:36 AM   Patient seen face to face for this evaluation, case discussed with treatment team and physician extender and formulated treatment plan. Reviewed the information documented and agree with the treatment plan.  Ambrose Finland, MD 09/15/2017

## 2017-09-15 NOTE — [facility] Suicide Risk Assessment (Cosign Needed)
Suicide Risk Assessment  Discharge Assessment   [facility] Discharge Suicide Risk Assessment   Principal Problem: Major depressive disorder, recurrent episode, mild ([place]) Discharge Diagnoses:  Patient Active Problem List   Diagnosis Date Noted  . Psychosis ([place]) [F29]   . Adjustment disorder with mixed disturbance of emotions and conduct [F43.25] 08/03/2017  . LGSIL on Pap smear of cervix [R87.612] 02/01/2017  . OCD (obsessive compulsive disorder) [F42.9] 10/05/2016  . Major depressive disorder, recurrent episode, mild ([place]) [F33.0] 05/04/2016  . 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 Learning disability [F81.9] 07/18/2015  . 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 Diabetes mellitus ([place]) [E11.9] 07/18/2015  . MDD (major depressive disorder), recurrent, severe, with psychosis ([place]) [F33.3] 07/18/2015  . Hyperlipidemia [E78.5] 07/18/2015  . Severe episode of recurrent major depressive disorder, without psychotic features ([place]) [F33.2]   . Drug overdose [T50.901A]   . Cognitive deficits [R41.89] 10/12/2012  . Generalized anxiety disorder [F41.1] 06/28/2012    Total Time spent with patient: 45 minutes  Musculoskeletal: Strength & Muscle Tone: within normal limits Gait & Station: normal Patient leans: N/A  Psychiatric Specialty Exam: Physical Exam  Constitutional: She is oriented to person, place, and time. She appears well-developed and well-nourished.  HENT:  Head: Normocephalic.  Respiratory: Effort normal.  Musculoskeletal: Normal range of motion.  Neurological: She is alert and oriented to person, place, and time.  Psychiatric: Her speech is normal and behavior is normal. Thought content normal. Cognition and memory are normal. She expresses impulsivity. She exhibits a depressed mood.   Review of Systems  Psychiatric/Behavioral: Positive for depression. Negative for hallucinations, memory loss, substance abuse and suicidal ideas. The patient is not  nervous/anxious and does not have insomnia.   All other systems reviewed and are negative.  Blood pressure 130/77, pulse 92, temperature 97.7 F (36.5 C), temperature source Axillary, resp. rate 12, height 5\' 7"  (1.702 m), weight 251 lb (113.9 kg), SpO2 96 %.Body mass index is 39.31 kg/m. General Appearance: Casual Eye Contact:  Good Speech:  Clear and Coherent and Normal Rate Volume:  Normal Mood:  Depressed Affect:  Congruent Thought Process:  Coherent, Goal Directed and Linear Orientation:  Full (Time, Place, and Person) Thought Content:  Logical Suicidal Thoughts:  No Homicidal Thoughts:  No Memory:  Immediate;   Good Recent;   Good Remote;   Fair Judgement:  Fair Insight:  Shallow Psychomotor Activity:  Normal Concentration:  Concentration: Good and Attention Span: Good Recall:  Good Fund of Knowledge:  Fair Language:  Good Akathisia:  No Handed:  Right AIMS (if indicated):    Assets:  Agricultural consultant Housing Social Support ADL's:  Intact Cognition:  WNL Sleep:   Good   Mental Status Per Nursing Assessment::   On Admission:   Suicidal ideation  Demographic Factors:  Adolescent or young adult, Living alone and Unemployed  Loss Factors: Financial problems/change in socioeconomic status  Historical Factors: Impulsivity  Risk Reduction Factors:   Sense of responsibility to family  Continued Clinical Symptoms:  Bipolar Disorder:   Depressive phase Depression:   Impulsivity  Cognitive Features That Contribute To Risk:  Closed-mindedness    Suicide Risk:  Minimal: No identifiable suicidal ideation.  Patients presenting with no risk factors but with morbid ruminations; may be classified as minimal risk based on the severity of the depressive symptoms    Plan Of Care/Follow-up recommendations:  Activity:  as tolerated Diet:  Heart Healthy  [name], NP 09/15/2017, 11:43 AM

## 2017-09-15 NOTE — ED Notes (Addendum)
Pt ambulatory w/o difficulty with mHt to dc area, belongings returned after leaving the unit. Bus pass given

## 2017-10-09 ENCOUNTER — Encounter (HOSPITAL_COMMUNITY): Payer: Self-pay | Admitting: Emergency Medicine

## 2017-10-09 ENCOUNTER — Emergency Department (HOSPITAL_COMMUNITY)
Admission: EM | Admit: 2017-10-09 | Discharge: 2017-10-10 | Disposition: A | Discharge status: Home / Self Care | Payer: Medicaid Other | Attending: Emergency Medicine | Admitting: Emergency Medicine

## 2017-10-09 ENCOUNTER — Emergency Department (HOSPITAL_COMMUNITY): Payer: Medicaid Other

## 2017-10-09 DIAGNOSIS — F17210 Nicotine dependence, cigarettes, uncomplicated: Secondary | ICD-10-CM | POA: Insufficient documentation

## 2017-10-09 DIAGNOSIS — F332 Major depressive disorder, recurrent severe without psychotic features: Secondary | ICD-10-CM | POA: Insufficient documentation

## 2017-10-09 DIAGNOSIS — E119 Type 2 diabetes mellitus without complications: Secondary | ICD-10-CM | POA: Diagnosis not present

## 2017-10-09 DIAGNOSIS — I10 Essential (primary) hypertension: Secondary | ICD-10-CM | POA: Diagnosis not present

## 2017-10-09 DIAGNOSIS — Z794 Long term (current) use of insulin: Secondary | ICD-10-CM | POA: Diagnosis not present

## 2017-10-09 DIAGNOSIS — T50902A Poisoning by unspecified drugs, medicaments and biological substances, intentional self-harm, initial encounter: Secondary | ICD-10-CM | POA: Insufficient documentation

## 2017-10-09 DIAGNOSIS — R45851 Suicidal ideations: Secondary | ICD-10-CM | POA: Insufficient documentation

## 2017-10-09 DIAGNOSIS — F329 Major depressive disorder, single episode, unspecified: Secondary | ICD-10-CM

## 2017-10-09 DIAGNOSIS — Z62820 Parent-biological child conflict: Secondary | ICD-10-CM | POA: Diagnosis not present

## 2017-10-09 DIAGNOSIS — F429 Obsessive-compulsive disorder, unspecified: Secondary | ICD-10-CM | POA: Diagnosis not present

## 2017-10-09 DIAGNOSIS — Z79899 Other long term (current) drug therapy: Secondary | ICD-10-CM | POA: Diagnosis not present

## 2017-10-09 DIAGNOSIS — F330 Major depressive disorder, recurrent, mild: Secondary | ICD-10-CM | POA: Diagnosis present

## 2017-10-09 DIAGNOSIS — F411 Generalized anxiety disorder: Secondary | ICD-10-CM | POA: Diagnosis present

## 2017-10-09 DIAGNOSIS — F32A Depression, unspecified: Secondary | ICD-10-CM

## 2017-10-09 LAB — COMPREHENSIVE METABOLIC PANEL
ALBUMIN: 4.3 g/dL (ref 3.5–5.0)
ALK PHOS: 51 U/L (ref 38–126)
ALT: 24 U/L (ref 14–54)
AST: 28 U/L (ref 15–41)
Anion gap: 13 (ref 5–15)
BUN: 11 mg/dL (ref 6–20)
CALCIUM: 9.6 mg/dL (ref 8.9–10.3)
CO2: 27 mmol/L (ref 22–32)
CREATININE: 0.81 mg/dL (ref 0.44–1.00)
Chloride: 101 mmol/L (ref 101–111)
GFR calc Af Amer: >60 mL/min (ref >60)
GFR calc non Af Amer: >60 mL/min (ref >60)
GLUCOSE: 182 mg/dL — AB (ref 65–99)
Potassium: 3.1 mmol/L — ABNORMAL LOW (ref 3.5–5.1)
SODIUM: 141 mmol/L (ref 135–145)
TOTAL PROTEIN: 7.3 g/dL (ref 6.5–8.1)
Total Bilirubin: 0.4 mg/dL (ref 0.3–1.2)

## 2017-10-09 LAB — RAPID URINE DRUG SCREEN, HOSP PERFORMED
Amphetamines: NOT DETECTED
Benzodiazepines: NOT DETECTED
COCAINE: NOT DETECTED
Opiates: NOT DETECTED
TETRAHYDROCANNABINOL: NOT DETECTED

## 2017-10-09 LAB — ETHANOL: Alcohol, Ethyl (B): <10 mg/dL (ref <10)

## 2017-10-09 LAB — CBC
HEMATOCRIT: 42 % (ref 36.0–46.0)
HEMOGLOBIN: 13.4 g/dL (ref 12.0–15.0)
MCH: 26.6 pg (ref 26.0–34.0)
MCHC: 31.9 g/dL (ref 30.0–36.0)
MCV: 83.3 fL (ref 78.0–100.0)
Platelets: 227 10*3/uL (ref 150–400)
RBC: 5.04 MIL/uL (ref 3.87–5.11)
RDW: 13.6 % (ref 11.5–15.5)
WBC: 10 10*3/uL (ref 4.0–10.5)

## 2017-10-09 LAB — I-STAT BETA HCG BLOOD, ED (MC, WL, AP ONLY)

## 2017-10-09 LAB — ACETAMINOPHEN LEVEL

## 2017-10-09 LAB — SALICYLATE LEVEL: Salicylate Lvl: <7 mg/dL (ref 2.8–30.0)

## 2017-10-09 MED ORDER — POTASSIUM CHLORIDE CRYS ER 20 MEQ PO TBCR
40.0000 meq | EXTENDED_RELEASE_TABLET | Freq: Once | ORAL | Status: AC
  Administered 2017-10-10: 40 meq via ORAL
  Filled 2017-10-09: qty 2

## 2017-10-09 NOTE — ED Notes (Signed)
Spoke with Suezanne Jacquet from poison control regarding overdose.   Recommends supportive care with observation for 8 hours after ingestion.  May have drowsiness, stomach upset, and tachycardia.  Also recommends benzos for any agitation or seizures which are unlikely.

## 2017-10-09 NOTE — ED Notes (Signed)
Belongings placed in locker #5 

## 2017-10-09 NOTE — ED Triage Notes (Signed)
Reports taking cymbalta 20mg  X 7 and buspar 15mg  X 5.  Reports she was trying to kill herself.

## 2017-10-09 NOTE — ED Provider Notes (Signed)
[facility] EMERGENCY DEPARTMENT Provider Note   CSN: [id number] Arrival date & time: 10/09/17  1957   History   Chief Complaint Chief Complaint  Patient presents with  . Suicidal  . Ingestion    HPI [name] is a 27 y.o. female who presents with depression and intentional overdose. PMH significant for frequent ED visits, bipolar d/o, cognitive impairment, depression/anxiety, insulin dependent DM. She states that at about 7PM tonight she decided to OD on her Cymbalta and Buspar. She took ~ 7 of each in attempt to kill herself. She states that she is very depressed and thinks she needs a medication adjustment since her current meds are not working for her. She reports some chest pain, abdominal pain, and bilateral leg pain since taking the meds. After taking the meds she called her ACT team and they advised to come to the ED. She reports ongoing SI. She denies HI, AVH. She denies OD on any other meds.  HPI  Past Medical History:  Diagnosis Date  . Anxiety   . Bipolar 1 disorder ([place])   . Cognitive deficits   . Depression   . Diabetes mellitus without complication ([place])   . Hypertension   . Mental disorder   . Obesity     Patient Active Problem List   Diagnosis Date Noted  . Psychosis ([place])   . 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 LGSIL on Pap smear of cervix 02/01/2017  . OCD (obsessive compulsive disorder) 10/05/2016  . Major depressive disorder, recurrent episode, mild ([place]) 05/04/2016  . 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 Learning disability 07/18/2015  . 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 Diabetes mellitus ([place]) 07/18/2015  . MDD (major depressive disorder), recurrent, severe, with psychosis ([place]) 07/18/2015  . Hyperlipidemia 07/18/2015  . Severe episode of recurrent major depressive disorder, without psychotic features ([place])   . Drug overdose   . Cognitive deficits 10/12/2012  .  Generalized anxiety disorder 06/28/2012    Past Surgical History:  Procedure Laterality Date  . CESAREAN SECTION    . CESAREAN SECTION N/A 04/25/2013   Procedure: REPEAT CESAREAN SECTION;  Surgeon: [name], MD;  Location: [facility];  Service: Obstetrics;  Laterality: N/A;  . MASS EXCISION N/A 06/03/2012   Procedure: EXCISION MASS;  Surgeon: [name], MD;  Location: [facility];  Service: ENT;  Laterality: N/A;  Excision uvula mass  . TONSILLECTOMY N/A 06/03/2012   Procedure: TONSILLECTOMY;  Surgeon: [name], MD;  Location: [facility];  Service: ENT;  Laterality: N/A;  . TONSILLECTOMY       OB History    Gravida  3   Para  3   Term  3   Preterm  0   AB  0   Living  3     SAB  0   TAB  0   Ectopic  0   Multiple  0   Live Births  3            Home Medications    Prior to Admission medications   Medication Sig Start Date End Date Taking? Authorizing Provider  busPIRone (BUSPAR) 15 MG tablet Take 1 tablet (15 mg total) by mouth 2 (two) times daily. For anxiety Patient taking differently: Take 15 mg by mouth 3 (three) times daily. For anxiety 10/06/16   Lindell Spar I, NP  DULoxetine (CYMBALTA) 20 MG capsule Take 20 mg by mouth 2 (two) times  daily.    [provider]  fluvoxaMINE (LUVOX) 100 MG tablet Take 100 mg by mouth 3 (three) times daily.    [provider]  glipiZIDE (GLUCOTROL XL) 10 MG 24 hr tablet Take 1 tablet (10 mg total) by mouth every evening. For diabetes management Patient not taking: Reported on 09/14/2017 10/06/16   Lindell Spar I, NP  glipiZIDE (GLUCOTROL) 10 MG tablet Take 10 mg by mouth 2 (two) times daily before a meal.    [provider]  hydrochlorothiazide (HYDRODIURIL) 25 MG tablet Take 1 TABLET BY MOUTH EVERY DAY for 30 days 02/15/17   [provider]  insulin detemir (LEVEMIR) 100 UNIT/ML injection Inject 40 Units into the skin 2 (two) times daily.    [provider]  lamoTRIgine (LAMICTAL) 25 MG tablet Take 25 mg by mouth at bedtime. 09/01/17   [provider]  paliperidone (INVEGA SUSTENNA) 234 MG/1.5ML SUSP injection Inject 234 mg every 30 (thirty) days into the muscle.    [provider]    Family History Family History  Problem Relation Age of Onset  . Hypertension Mother   . Diabetes Father     Social History Social History   Tobacco Use  . Smoking status: Current Every Day Smoker    Packs/day: 1.00    Years: 11.00    Pack years: 11.00    Types: Cigarettes  . Smokeless tobacco: Never Used  Substance Use Topics  . Alcohol use: No  . Drug use: No    Comment: Patient denies     Allergies   Wellbutrin [bupropion]; Omnipaque [iohexol]; Penicillins; and Depakote er [divalproex sodium er]   Review of Systems Review of Systems  Constitutional: Negative for fever.  Respiratory: Negative for shortness of breath.   Cardiovascular: Positive for chest pain.  Gastrointestinal: Positive for abdominal pain. Negative for nausea and vomiting.  Neurological: Negative for light-headedness.  Psychiatric/Behavioral: Positive for behavioral problems, dysphoric mood, self-injury and suicidal ideas.  All other systems reviewed and are negative.    Physical Exam Updated Vital Signs BP 126/69 (BP Location: Right Arm)   Pulse (!) 103   Temp 98.6 F (37 C) (Oral)   Resp 15   Ht 5\' 7"  (1.702 m)   Wt 113.4 kg (250 lb)   SpO2 100%   BMI 39.16 kg/m   Physical Exam  Constitutional: She is oriented to person, place, and time. She appears well-developed and well-nourished. No distress.  Obese female in NAD. She continuously says the work "brisk" and states she likes saying this word  HENT:  Head: Normocephalic and atraumatic.  Eyes: Pupils are equal, round, and reactive to light. Conjunctivae are normal. Right eye exhibits no discharge. Left eye exhibits no discharge. No scleral icterus.  Neck: Normal range of  motion.  Cardiovascular: Normal rate and regular rhythm.  Pulmonary/Chest: Effort normal and breath sounds normal. No respiratory distress.  Abdominal: Soft. Bowel sounds are normal. She exhibits no distension. There is tenderness (generalized).  Neurological: She is alert and oriented to person, place, and time.  Ambulatory  Skin: Skin is warm and dry.  Psychiatric: She has a normal mood and affect. Her behavior is normal.  Nursing note and vitals reviewed.    ED Treatments / Results  Labs (all labs ordered are listed, but only abnormal results are displayed) Labs Reviewed  COMPREHENSIVE METABOLIC PANEL - Abnormal; Notable for the following components:      Result Value   Potassium 3.1 (*)  Glucose, Bld 182 (*)    All other components within normal limits  ACETAMINOPHEN LEVEL - Abnormal; Notable for the following components:   Acetaminophen (Tylenol), Serum <10 (*)    All other components within normal limits  RAPID URINE DRUG SCREEN, HOSP PERFORMED - Abnormal; Notable for the following components:   Barbiturates   (*)    Value: Result not available. Reagent lot number recalled by manufacturer.   All other components within normal limits  ETHANOL  SALICYLATE LEVEL  CBC  I-STAT BETA HCG BLOOD, ED ([facility], [facility], [facility] ONLY)    EKG None  Radiology Dg Chest 2 View  Result Date: 10/09/2017 CLINICAL DATA:  Mid chest pain. EXAM: CHEST - 2 VIEW COMPARISON:  07/04/2017 FINDINGS: Low lung volumes.The cardiomediastinal contours are normal. The lungs are clear. Pulmonary vasculature is normal. No consolidation, pleural effusion, or pneumothorax. No acute osseous abnormalities are seen. IMPRESSION: Low lung volumes without acute abnormality. Electronically Signed   By: Jeb Levering M.D.   On: 10/09/2017 21:56    Procedures Procedures (including critical care time)  Medications Ordered in ED Medications  acetaminophen (TYLENOL) tablet 500 mg (500 mg Oral Not Given 10/10/17 0408)    busPIRone (BUSPAR) tablet 15 mg (has no administration in time range)  DULoxetine (CYMBALTA) DR capsule 20 mg (has no administration in time range)  glipiZIDE (GLUCOTROL) tablet 10 mg (has no administration in time range)  hydrochlorothiazide (HYDRODIURIL) tablet 25 mg (has no administration in time range)  insulin detemir (LEVEMIR) injection 40 Units (has no administration in time range)  lamoTRIgine (LAMICTAL) tablet 25 mg (has no administration in time range)  paliperidone (INVEGA) 24 hr tablet 6 mg (6 mg Oral Given 10/10/17 1547)  fluvoxaMINE (LUVOX) tablet 50 mg (has no administration in time range)  potassium chloride SA (K-DUR,KLOR-CON) CR tablet 40 mEq (40 mEq Oral Given 10/10/17 0124)     Initial Impression / Assessment and Plan / ED Course  I have reviewed the triage vital signs and the nursing notes.  Pertinent labs & imaging results that were available during my care of the patient were reviewed by me and considered in my medical decision making (see chart for details).  27 year old female who is well-known to the ED who presents with SI and reported intentional OD in attempt to harm herself. She is tachycardic but otherwise vitals are normal. She has well-documented hx of attention seeking behavior and mental health related issues. Poison control was contacted and advised monitoring for 8 hours after ingestion which would be ~3AM. Will initiate medical clearance labs.  Labs are remarkable for hypokalemia (3.1). This was replaced. All other labs are normal. UDS is clean. She can be medically cleared at [place] transferred to [place] PA-C.  Final Clinical Impressions(s) / ED Diagnoses   Final diagnoses:  Suicidal ideation  Depression, unspecified depression type  OD (overdose of drug), intentional self-harm, initial encounter [facility])    ED Discharge Orders    None       Recardo Evangelist, PA-C 10/10/17 1649    Dorie Rank, MD 10/11/17 1436

## 2017-10-09 NOTE — ED Notes (Signed)
Pt requested a [place] sandwich ok per [place]. Pt given a [place] sandwich, graham crackers, apple sauce, peanut butter and sprite

## 2017-10-10 ENCOUNTER — Other Ambulatory Visit: Payer: Self-pay

## 2017-10-10 ENCOUNTER — Encounter (HOSPITAL_COMMUNITY): Payer: Self-pay | Admitting: Registered Nurse

## 2017-10-10 DIAGNOSIS — F419 Anxiety disorder, unspecified: Secondary | ICD-10-CM

## 2017-10-10 DIAGNOSIS — F429 Obsessive-compulsive disorder, unspecified: Secondary | ICD-10-CM

## 2017-10-10 DIAGNOSIS — F330 Major depressive disorder, recurrent, mild: Secondary | ICD-10-CM | POA: Diagnosis not present

## 2017-10-10 DIAGNOSIS — Z62820 Parent-biological child conflict: Secondary | ICD-10-CM | POA: Diagnosis not present

## 2017-10-10 DIAGNOSIS — Z79899 Other long term (current) drug therapy: Secondary | ICD-10-CM

## 2017-10-10 DIAGNOSIS — T50902A Poisoning by unspecified drugs, medicaments and biological substances, intentional self-harm, initial encounter: Secondary | ICD-10-CM | POA: Diagnosis not present

## 2017-10-10 MED ORDER — PALIPERIDONE ER 6 MG PO TB24
6.0000 mg | ORAL_TABLET | Freq: Every day | ORAL | Status: DC
  Administered 2017-10-10: 6 mg via ORAL
  Filled 2017-10-10: qty 1

## 2017-10-10 MED ORDER — FLUVOXAMINE MALEATE 50 MG PO TABS: 50.0000 mg | ORAL_TABLET | Freq: Every day | ORAL | 0 refills | Status: DC

## 2017-10-10 MED ORDER — BUSPIRONE HCL 10 MG PO TABS: 15.0000 mg | ORAL_TABLET | Freq: Three times a day (TID) | ORAL | Status: DC

## 2017-10-10 MED ORDER — LAMOTRIGINE 25 MG PO TABS: 25.0000 mg | ORAL_TABLET | Freq: Every day | ORAL | Status: DC

## 2017-10-10 MED ORDER — PALIPERIDONE ER 6 MG PO TB24: 6.0000 mg | ORAL_TABLET | Freq: Every day | ORAL | 0 refills | Status: DC

## 2017-10-10 MED ORDER — HYDROCHLOROTHIAZIDE 25 MG PO TABS: 25.0000 mg | ORAL_TABLET | Freq: Every day | ORAL | Status: DC

## 2017-10-10 MED ORDER — DULOXETINE HCL 20 MG PO CPEP: 20.0000 mg | ORAL_CAPSULE | Freq: Two times a day (BID) | ORAL | Status: DC

## 2017-10-10 MED ORDER — FLUVOXAMINE MALEATE 50 MG PO TABS: 50.0000 mg | ORAL_TABLET | Freq: Every day | ORAL | Status: DC

## 2017-10-10 MED ORDER — INSULIN DETEMIR 100 UNIT/ML ~~LOC~~ SOLN: 40.0000 [IU] | Freq: Two times a day (BID) | SUBCUTANEOUS | Status: DC

## 2017-10-10 MED ORDER — FLUVOXAMINE MALEATE 50 MG PO TABS: 100.0000 mg | ORAL_TABLET | Freq: Three times a day (TID) | ORAL | Status: DC

## 2017-10-10 MED ORDER — ACETAMINOPHEN 500 MG PO TABS: 500.0000 mg | ORAL_TABLET | Freq: Once | ORAL | Status: DC

## 2017-10-10 MED ORDER — GLIPIZIDE 10 MG PO TABS: 10.0000 mg | ORAL_TABLET | Freq: Two times a day (BID) | ORAL | Status: DC

## 2017-10-10 NOTE — ED Notes (Signed)
Patient ate breakfast tray. Used the restroom and went back to sleep.

## 2017-10-10 NOTE — ED Notes (Signed)
Regular Diet was ordered for Lunch. 

## 2017-10-10 NOTE — ED Notes (Addendum)
Patient sleeping at this time. A snack will be offered when pt. wakeup.

## 2017-10-10 NOTE — [facility] Suicide Risk Assessment (Signed)
Suicide Risk Assessment  Discharge Assessment   [facility] Discharge Suicide Risk Assessment   Principal Problem: Major depressive disorder, recurrent episode, mild ([place]) Discharge Diagnoses:  Patient Active Problem List   Diagnosis Date Noted  . Major depressive disorder, recurrent episode, mild ([place]) [F33.0] 05/04/2016    Priority: High  . Generalized anxiety disorder [F41.1] 06/28/2012    Priority: High  . Psychosis ([place]) [F29]   . Adjustment disorder with mixed disturbance of emotions and conduct [F43.25] 08/03/2017  . LGSIL on Pap smear of cervix [R87.612] 02/01/2017  . OCD (obsessive compulsive disorder) [F42.9] 10/05/2016  . 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 Learning disability [F81.9] 07/18/2015  . 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 Diabetes mellitus ([place]) [E11.9] 07/18/2015  . MDD (major depressive disorder), recurrent, severe, with psychosis ([place]) [F33.3] 07/18/2015  . Hyperlipidemia [E78.5] 07/18/2015  . Severe episode of recurrent major depressive disorder, without psychotic features ([place]) [F33.2]   . Drug overdose [T50.901A]   . Cognitive deficits [R41.89] 10/12/2012    Total Time spent with patient: 45 minutes   Musculoskeletal: Strength & Muscle Tone: within normal limits Gait & Station: normal Patient leans: N/A  Psychiatric Specialty Exam: Physical Exam  Nursing note and vitals reviewed. Constitutional: She is oriented to person, place, and time. She appears well-developed and well-nourished.  HENT:  Head: Normocephalic.  Neck: Normal range of motion.  Respiratory: Effort normal.  Musculoskeletal: Normal range of motion.  Neurological: She is alert and oriented to person, place, and time.  Psychiatric: She has a normal mood and affect. Her speech is normal and behavior is normal. Judgment and thought content normal. Cognition and memory are normal.    Review of Systems  Psychiatric/Behavioral: The patient is  nervous/anxious.   All other systems reviewed and are negative.   Blood pressure 121/80, pulse 88, temperature 98.2 F (36.8 C), temperature source Oral, resp. rate 18, height 5\' 7"  (1.702 m), weight 113.4 kg (250 lb), SpO2 99 %.Body mass index is 39.16 kg/m.  General Appearance: Casual  Eye Contact:  Good  Speech:  Normal Rate  Volume:  Normal  Mood:  Anxious, mild  Affect:  Blunt  Thought Process:  Coherent and Descriptions of Associations: Intact  Orientation:  Full (Time, Place, and Person)  Thought Content:  WDL and Logical  Suicidal Thoughts:  No  Homicidal Thoughts:  No  Memory:  Immediate;   Good Recent;   Good Remote;   Good  Judgement:  Fair  Insight:  Fair  Psychomotor Activity:  Normal  Concentration:  Concentration: Good and Attention Span: Good  Recall:  Good  Fund of Knowledge:  Fair  Language:  Good  Akathisia:  No  Handed:  Right  AIMS (if indicated):     Assets:  Housing Leisure Time Physical Health Resilience Social Support  ADL's:  Intact  Cognition:  Impaired,  Mild  Sleep:      Mental Status Per Nursing Assessment::   On Admission:   overdose  Demographic Factors:  Adolescent or young adult and Living alone  Loss Factors: NA  Historical Factors: NA  Risk Reduction Factors:   Sense of responsibility to family, Positive social support and Positive therapeutic relationship  Continued Clinical Symptoms:  Anxious, mild  Cognitive Features That Contribute To Risk:  None    Suicide Risk:  Minimal: No identifiable suicidal ideation.  Patients presenting with no risk factors but with morbid ruminations; may be classified as minimal risk based on the severity of the depressive  symptoms  Follow-up Information    Benito Mccreedy, MD.   Specialty:  Internal Medicine Contact information: [id number] [place] [address] [phone]           Plan Of Care/Follow-up recommendations:  Activity:  as  tolerated Diet:  heart healthy diet  Elide Stalzer, NP 10/10/2017, 4:15 PM

## 2017-10-10 NOTE — BH Assessment (Addendum)
Tele Assessment Note   Patient Name: [name] MRN: [id number] Referring Physician: Doristine Devoid, PA-C Location of Patient: [name], [place] Location of Provider: [place] is an 27 y.o. single female who presents unaccompanied to [name] reporting symptoms of depression and states she ingested 7 tabs of Cymbalta and 7 tabs of Buspar in a suicide attempt. She reports she called [facility] who recommended she come to ED for medical treatment. Pt has an extensive mental health history and has presented to emergency departments 27 times in 2019. She reports a history of four previous suicide attempts by overdose. Pt says she feels severely depressed and is often angry "for no reason." She acknowledges symptoms including crying spells, social withdrawal, loss of interest in usual pleasures, fatigue, irritability, decreased concentration, increased sleep, staying in bed and feelings of guilt and hopelessness. Pt acknowledges feeling lonely. She denies a history of intentional self-injurious behavior. She denies current homicidal ideation or history of violence. She denies any psychotic symptoms. She denies alcohol or substance use; urine drug screen and alcohol level are negative.   Pt identifies several stressors. She says she cannot stay alone in her apartment and that she walks around outside day and night because she feels restless. She says she cannot manage her diabetes without assistance and admits she eats "junk food" at night. She says she is able to stay in her apartment during the week but on weekends visits her mother. She states she has conflicts with her mother and "she is disrespectful to me." Pt cannot identify anyone in her life who is supportive. She reports she has biological daughters who "are in adoption" (ages 5, 47 and 39). Pt says she is unemployed and receiving disability income. Pt denies history of abuse or  trauma.    Pt is currently receiving outpatient medication management through [name]. She says she doesn't feel her psychiatrist listens to her and doesn't believe her psychiatric medications are effective. She says during assessment she take her medications as prescribed. Pt reports she was treated inpatient at [facility] in February 2019. She reports she has been psychiatrically hospitalized at facilities including [facility], [facility], [place], and [place].   Pt is dressed in hospital scrubs and her head is shaved. She is alert and oriented x4. Pt speaks in a clear tone, at moderate volume and normal pace. Motor behavior appears normal. Eye contact is good. Pt's mood is depressed and affect is congruent with mood. Thought process is coherent and relevant. There is no indication Pt is currently responding to internal stimuli or experiencing delusional thought content. Pt was cooperative throughout assessment. She says she will sign voluntarily into a psychiatric facility and says "I really need some help."    Diagnosis: F33.2 Major Depressive Disorder, Recurrent, Severe   Past Medical History:  Past Medical History:  Diagnosis Date  . Anxiety   . Bipolar 1 disorder ([place])   . Cognitive deficits   . Depression   . Diabetes mellitus without complication ([place])   . Hypertension   . Mental disorder   . Obesity     Past Surgical History:  Procedure Laterality Date  . CESAREAN SECTION    . CESAREAN SECTION N/A 04/25/2013   Procedure: REPEAT CESAREAN SECTION;  Surgeon: [name], MD;  Location: [facility];  Service: Obstetrics;  Laterality: N/A;  . MASS EXCISION N/A 06/03/2012   Procedure: EXCISION  MASS;  Surgeon: [name], MD;  Location: [place];  Service: ENT;  Laterality: N/A;  Excision uvula mass  . TONSILLECTOMY N/A 06/03/2012   Procedure: TONSILLECTOMY;  Surgeon: [name], MD;  Location: [place];  Service:  ENT;  Laterality: N/A;  . TONSILLECTOMY      Family History:  Family History  Problem Relation Age of Onset  . Hypertension Mother   . Diabetes Father     Social History:  reports that she has been smoking cigarettes.  She has a 11.00 pack-year smoking history. She has never used smokeless tobacco. She reports that she does not drink alcohol or use drugs.  Additional Social History:  Alcohol / Drug Use Pain Medications: see PTA meds Prescriptions: see PTA meds Over the Counter: see PTA meds History of alcohol / drug use?: No history of alcohol / drug abuse Longest period of sobriety (when/how long): NA  CIWA: CIWA-Ar BP: 122/80 Pulse Rate: 100 COWS:    Allergies:  Allergies  Allergen Reactions  . Wellbutrin [Bupropion] Shortness Of Breath  . Omnipaque [Iohexol] Swelling and Other (See Comments)    Reaction:  Eye swelling  . Penicillins Hives and Other (See Comments)    Has patient had a PCN reaction causing immediate rash, facial/tongue/throat swelling, SOB or lightheadedness with hypotension: No Has patient had a PCN reaction causing severe rash involving mucus membranes or skin necrosis: No Has patient had a PCN reaction that required hospitalization No Has patient had a PCN reaction occurring within the last 10 years: No If all of the above answers are "NO", then may proceed with Cephalosporin use.  . Depakote Er [Divalproex Sodium Er] Nausea And Vomiting    Home Medications:  (Not in a hospital admission)  OB/GYN Status:  No LMP recorded. Patient has had an implant.  General Assessment Data Location of Assessment: [facility] ED TTS Assessment: In system Is this a Tele or Face-to-Face Assessment?: Tele Assessment Is this an Initial Assessment or a Re-assessment for this encounter?: Initial Assessment Marital status: Single Maiden name: Standen Is patient pregnant?: No Pregnancy Status: No Living Arrangements: Alone Can pt return to current living arrangement?:  Yes Admission Status: Voluntary Is patient capable of signing voluntary admission?: Yes Referral Source: Self/Family/Friend Insurance type: Medicaid     Crisis Care Plan Living Arrangements: Alone Legal Guardian: Other:(Self) Name of Psychiatrist: Warden/ranger Name of Therapist: [facility]  Education Status Is patient currently in school?: No Highest grade of school patient has completed: 11 Is the patient employed, unemployed or receiving disability?: Receiving disability income  Risk to self with the past 6 months Suicidal Ideation: Yes-Currently Present Has patient been a risk to self within the past 6 months prior to admission? : Yes Suicidal Intent: Yes-Currently Present Has patient had any suicidal intent within the past 6 months prior to admission? : Yes Is patient at risk for suicide?: Yes Suicidal Plan?: Yes-Currently Present Has patient had any suicidal plan within the past 6 months prior to admission? : Yes Specify Current Suicidal Plan: Pt reports she overdosed on Cymbalta and Buspar in suicide attempt Access to Means: Yes Specify Access to Suicidal Means: Access to prescription medications What has been your use of drugs/alcohol within the last 12 months?: Pt denies Previous Attempts/Gestures: Yes How many times?: 5 Other Self Harm Risks: None Triggers for Past Attempts: Unpredictable Intentional Self Injurious Behavior: Damaging Comment - Self Injurious Behavior: Shaving head, pulling out eyebrows Family Suicide History: No Recent stressful life event(s):  Conflict (Comment)(Conflict with mother) Persecutory voices/beliefs?: No Depression: Yes Depression Symptoms: Despondent, Tearfulness, Isolating, Fatigue, Guilt, Loss of interest in usual pleasures, Feeling worthless/self pity, Feeling angry/irritable Substance abuse history and/or treatment for substance abuse?: No Suicide prevention information given to non-admitted patients: Not applicable  Risk to Others within the  past 6 months Homicidal Ideation: No Does patient have any lifetime risk of violence toward others beyond the six months prior to admission? : No Thoughts of Harm to Others: No Current Homicidal Intent: No Current Homicidal Plan: No Access to Homicidal Means: No Identified Victim: None History of harm to others?: No Assessment of Violence: None Noted Violent Behavior Description: Pt denies history of violence Does patient have access to weapons?: No Criminal Charges Pending?: No Does patient have a court date: No Is patient on probation?: No  Psychosis Hallucinations: None noted Delusions: None noted  Mental Status Report Appearance/Hygiene: In scrubs, Unremarkable Eye Contact: Good Motor Activity: Unremarkable Speech: Logical/coherent Level of Consciousness: Alert Mood: Depressed Affect: Appropriate to circumstance Anxiety Level: None Thought Processes: Coherent, Relevant Judgement: Partial Orientation: Person, Place, Time, Situation Obsessive Compulsive Thoughts/Behaviors: None  Cognitive Functioning Concentration: Normal Memory: Recent Intact, Remote Intact Is patient IDD: Yes Level of Function: Bordering intellectual functioning Is patient DD?: No I IQ score available?: No Insight: Poor Impulse Control: Fair Appetite: Good Have you had any weight changes? : No Change Amount of the weight change? (lbs): 0 lbs Sleep: Increased Total Hours of Sleep: 10 Vegetative Symptoms: Staying in bed  ADLScreening [facility] Assessment Services) Patient's cognitive ability adequate to safely complete daily activities?: Yes Patient able to express need for assistance with ADLs?: Yes Independently performs ADLs?: Yes (appropriate for developmental age)  Prior Inpatient Therapy Prior Inpatient Therapy: Yes Prior Therapy Dates: MULTIPLE Prior Therapy Facilty/Provider(s): MULTIPLE: [facility], [facility], [facility], [facility] Reason for Treatment: SI, MDD  Prior Outpatient Therapy Prior Outpatient  Therapy: Yes Prior Therapy Dates: CURRENT Prior Therapy Facilty/Provider(s): [facility] Reason for Treatment: MED MGMT & COUNSELING Does patient have an ACCT team?: No Does patient have Intensive In-House Services?  : No Does patient have [facility] services? : Yes Does patient have [facility] services?: No  ADL Screening (condition at time of admission) Patient's cognitive ability adequate to safely complete daily activities?: Yes Is the patient deaf or have difficulty hearing?: No Does the patient have difficulty seeing, even when wearing glasses/contacts?: No Does the patient have difficulty concentrating, remembering, or making decisions?: No Patient able to express need for assistance with ADLs?: Yes Does the patient have difficulty dressing or bathing?: No Independently performs ADLs?: Yes (appropriate for developmental age) Does the patient have difficulty walking or climbing stairs?: No Weakness of Legs: None Weakness of Arms/Hands: None       Abuse/Neglect Assessment (Assessment to be complete while patient is alone) Abuse/Neglect Assessment Can Be Completed: Yes Physical Abuse: Denies Verbal Abuse: Denies Sexual Abuse: Denies Exploitation of patient/patient's resources: Denies Self-Neglect: Denies     Regulatory affairs officer (For Healthcare) Does Patient Have a Medical Advance Directive?: No Would patient like information on creating a medical advance directive?: No - Patient declined          Disposition: Gave clinical report to [name], PA who recommended Pt be monitored for safety and stabilization and evaluated by psychiatry later this morning. Notified Doristine Devoid, PA-C and Beatris Ship, RN of recommendation.  Disposition Initial Assessment Completed for this Encounter: Yes Patient referred to: Other (Comment)  This service was provided via telemedicine using a  2-way, interactive audio and Radiographer, therapeutic.  Names of all persons participating in  this telemedicine service and their role in this encounter. Name: [name] Role: Patient  Name: Storm Frisk, [place] Role: TTS counselor         Orpah Greek Anson Fret, [facility], [facility], [facility] Triage Specialist [phone]  Anson Fret, Orpah Greek 10/10/2017 4:00 AM

## 2017-10-10 NOTE — ED Provider Notes (Addendum)
Care assumed from previous provider PA Gekas. Please see their note for further details to include full history and physical. To summarize in short pt is a 27 year old female who is well-known to the ED who presents for overdose and attempt to self-harm.. Case discussed, plan agreed upon.  Time of care handoff was awaiting 8-hour observation.  Patient was reassessed.  She is sleeping but easily aroused to verbal stimuli.  Heart rate remains in the low 100s with history of same.  Vital signs otherwise remained reassuring.  Patient informed nurse that she wants only for headache however when I asked she said that she was not having a headache.  She is eating and drinking at baseline.  TTS was consulted and appreciate their recommendation and disposition.  Patient can be medically clear at this time.   TTS recommends overnight observation and reevaluation in the morning by psychiatry.    Doristine Devoid, PA-C 10/10/17 0323    Doristine Devoid, PA-C 10/10/17 [id number]    Orpah Greek, MD 10/10/17 [phone]

## 2017-10-10 NOTE — ED Notes (Signed)
Breakfast tray ordered 

## 2017-10-10 NOTE — ED Notes (Signed)
Regular Diet has been ordered for Dinner. 

## 2017-10-10 NOTE — Progress Notes (Signed)
Patient meets criteria for inpatient treatment. There are no available beds at [facility] currently. CSW faxed referrals to the following facilities for review:  [facility], [place], [place], [place], [facility], [place], [place], [place], [place], [place], [place], [place].   TTS will continue to seek bed placement.   Maxie Better, MSW, LCSW Clinical Social Worker 10/10/2017 12:19 PM

## 2017-10-10 NOTE — ED Triage Notes (Signed)
PT lunch meal delivered at 1451

## 2017-10-10 NOTE — ED Notes (Signed)
Declined W/C at D/C and was escorted to lobby by RN. 

## 2017-10-10 NOTE — Consult Note (Signed)
  Tele Assessment   [name], 27 y.o., female patient presented to [facility] with complaints depression and ingesting 7 tablets of Cymbalta and Buspar in a suicide attempt.  Patient seen via telepsych by this provider; chart reviewed and consulted with Dr. Dwyane Dee on 10/10/17.  On evaluation [name] reports that she was feeling depressed because of an argument with her mother and she took an overdose of Cymbalta and Buspar in a suicide attempt.  Patient states that she is feeling no better this morning and does not contact for safety.  States that she saw her psychiatrist 2 weeks ago and was started on Lamictal but it is not working.  Patient denies homicidal ideation,  psychosis, and paranoia.     During evaluation [name] is alert/oriented x 4; calm/cooperative; and mood congruent with affect.  She does not appear to be responding to internal/external stimuli or delusional thoughts.  Patient denied homicidal ideation, psychosis, and paranoia; but continues to endorse suicidal ideation.  Patient answered question appropriately.  Recommendations:  Inpatient psychiatric treatment  Disposition: Recommend psychiatric Inpatient admission when medically cleared.   [name], NP

## 2017-10-10 NOTE — Consult Note (Signed)
[facility] Consult   Reason for Consult:  Altercation with [name] and overdose Referring Physician:  EDP Patient Identification: [name] MRN:  [id number] Principal Diagnosis: Major depressive disorder, recurrent episode, mild ([place]) Diagnosis:   Patient Active Problem List   Diagnosis Date Noted  . Major depressive disorder, recurrent episode, mild ([place]) [F33.0] 05/04/2016    Priority: High  . Generalized anxiety disorder [F41.1] 06/28/2012    Priority: High  . Psychosis ([place]) [F29]   . Adjustment disorder with mixed disturbance of emotions and conduct [F43.25] 08/03/2017  . LGSIL on Pap smear of cervix [R87.612] 02/01/2017  . OCD (obsessive compulsive disorder) [F42.9] 10/05/2016  . 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 Learning disability [F81.9] 07/18/2015  . 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 Diabetes mellitus ([place]) [E11.9] 07/18/2015  . MDD (major depressive disorder), recurrent, severe, with psychosis ([place]) [F33.3] 07/18/2015  . Hyperlipidemia [E78.5] 07/18/2015  . Severe episode of recurrent major depressive disorder, without psychotic features ([place]) [F33.2]   . Drug overdose [T50.901A]   . Cognitive deficits [R41.89] 10/12/2012    Total Time spent with patient: 45 minutes  Subjective:   [name] is a 27 y.o. female patient does not warrant admission.  HPI:  27 yo female who is well known to this ED and provider.  Presents after an overdose due to getting upset with [name].  This afternoon she is calm and cooperative.  She is frustrated that Stacy medication is not working for Stacy mood stabilization.  Explored what has been helpful in the past and she reports Invega oral works but not the injection.  Started Invega 6 mg daily for mood.  Then concerned about Stacy OCD compulsion to shave Stacy head.  Discussed medications and would like to restart Luvox as when she was on this medication she was not shaving Stacy  head.  Denies suicidal ideations and reports she was frustrated yesterday but not anymore.  No suicidal/homicidal ideations, hallucinations, or substance abuse.  She would like to leave and catch the last bus home.  [name] is connected with [facility] who has set up a team to come visit Stacy this week in Stacy home, specifically a psychiatrist.  She does not want to miss this opportunity.  Otto talked to [name] who did not express any safety concerns.  [name] reports she will call Stacy CST team if suicidal ideations return.  No longer a threat to herself.    Past Psychiatric History: depression, OCD, anxiety  Risk to Self: None Risk to Others: Homicidal Ideation: No Thoughts of Harm to Others: No Current Homicidal Intent: No Current Homicidal Plan: No Access to Homicidal Means: No Identified Victim: None History of harm to others?: No Assessment of Violence: None Noted Violent Behavior Description: Pt denies history of violence Does patient have access to weapons?: No Criminal Charges Pending?: No Does patient have a court date: No Prior Inpatient Therapy: Prior Inpatient Therapy: Yes Prior Therapy Dates: MULTIPLE Prior Therapy Facilty/Provider(s): MULTIPLE: [facility], [facility], [facility], [facility] Reason for Treatment: SI, MDD Prior Outpatient Therapy: Prior Outpatient Therapy: Yes Prior Therapy Dates: CURRENT Prior Therapy Facilty/Provider(s): [facility] Reason for Treatment: MED MGMT & COUNSELING Does patient have an ACCT team?: No Does patient have Intensive In-House Services?  : No Does patient have [facility] services? : Yes Does patient have P4[facility] services?: No  Past Medical History:  Past Medical History:  Diagnosis Date  . Anxiety   . Bipolar 1 disorder ([place])   .  Cognitive deficits   . Depression   . Diabetes mellitus without complication ([place])   . Hypertension   . Mental disorder   . Obesity     Past Surgical History:  Procedure Laterality Date  . CESAREAN SECTION    . CESAREAN  SECTION N/A 04/25/2013   Procedure: REPEAT CESAREAN SECTION;  Surgeon: [name], MD;  Location: [facility];  Service: Obstetrics;  Laterality: N/A;  . MASS EXCISION N/A 06/03/2012   Procedure: EXCISION MASS;  Surgeon: [name], MD;  Location: [facility];  Service: ENT;  Laterality: N/A;  Excision uvula mass  . TONSILLECTOMY N/A 06/03/2012   Procedure: TONSILLECTOMY;  Surgeon: [name], MD;  Location: [facility];  Service: ENT;  Laterality: N/A;  . TONSILLECTOMY     Family History:  Family History  Problem Relation Age of Onset  . Hypertension [name]   . Diabetes Father    Family Psychiatric  History: none Social History:  Social History   Substance and Sexual Activity  Alcohol Use No     Social History   Substance and Sexual Activity  Drug Use No   Comment: Patient denies    Social History   Socioeconomic History  . Marital status: Single    Spouse name: Not on file  . Number of children: Not on file  . Years of education: Not on file  . Highest education level: Not on file  Occupational History  . Not on file  Social Needs  . Financial resource strain: Not on file  . Food insecurity:    Worry: Not on file    Inability: Not on file  . Transportation needs:    Medical: Not on file    Non-medical: Not on file  Tobacco Use  . Smoking status: Current Every Day Smoker    Packs/day: 1.00    Years: 11.00    Pack years: 11.00    Types: Cigarettes  . Smokeless tobacco: Never Used  Substance and Sexual Activity  . Alcohol use: No  . Drug use: No    Comment: Patient denies  . Sexual activity: Yes    Birth control/protection: Implant  Lifestyle  . Physical activity:    Days per week: Not on file    Minutes per session: Not on file  . Stress: Not on file  Relationships  . Social connections:    Talks on phone: Not on file    Gets together: Not on file    Attends religious service: Not on file    Active member of club or  organization: Not on file    Attends meetings of clubs or organizations: Not on file    Relationship status: Not on file  Other Topics Concern  . Not on file  Social History Narrative  . Not on file   Additional Social History:    Allergies:   Allergies  Allergen Reactions  . Wellbutrin [Bupropion] Shortness Of Breath  . Omnipaque [Iohexol] Swelling and Other (See Comments)    Reaction:  Eye swelling  . Penicillins Hives and Other (See Comments)    Has patient had a PCN reaction causing immediate rash, facial/tongue/throat swelling, SOB or lightheadedness with hypotension: No Has patient had a PCN reaction causing severe rash involving mucus membranes or skin necrosis: No Has patient had a PCN reaction that required hospitalization No Has patient had a PCN reaction occurring within the last 10 years: No If all of the above answers are "NO", then may  proceed with Cephalosporin use.  . Depakote Er [Divalproex Sodium Er] Nausea And Vomiting    Labs:  Results for orders placed or performed during the hospital encounter of 10/09/17 (from the past 48 hour(s))  Comprehensive metabolic panel     Status: Abnormal   Collection Time: 10/09/17  8:28 PM  Result Value Ref Range   Sodium 141 135 - 145 mmol/L   Potassium 3.1 (L) 3.5 - 5.1 mmol/L   Chloride 101 101 - 111 mmol/L   CO2 27 22 - 32 mmol/L   Glucose, Bld 182 (H) 65 - 99 mg/dL   BUN 11 6 - 20 mg/dL   Creatinine, Ser 0.81 0.44 - 1.00 mg/dL   Calcium 9.6 8.9 - 10.3 mg/dL   Total Protein 7.3 6.5 - 8.1 g/dL   Albumin 4.3 3.5 - 5.0 g/dL   AST 28 15 - 41 U/L   ALT 24 14 - 54 U/L   Alkaline Phosphatase 51 38 - 126 U/L   Total Bilirubin 0.4 0.3 - 1.2 mg/dL   GFR calc non Af Amer >60 >60 mL/min   GFR calc Af Amer >60 >60 mL/min    Comment: (NOTE) The eGFR has been calculated using the CKD EPI equation. This calculation has not been validated in all clinical situations. eGFR's persistently <60 mL/min signify possible Chronic  Kidney Disease.    Anion gap 13 5 - 15    Comment: Performed at [facility] [address], [place], [place] [address]  Ethanol     Status: None   Collection Time: 10/09/17  8:28 PM  Result Value Ref Range   Alcohol, Ethyl (B) <10 <10 mg/dL    Comment: (NOTE) Lowest detectable limit for serum alcohol is 10 mg/dL. For medical purposes only. Performed at [facility], [place] [address], [place], [place] [address]   Salicylate level     Status: None   Collection Time: 10/09/17  8:28 PM  Result Value Ref Range   Salicylate Lvl <1.7 2.8 - 30.0 mg/dL    Comment: Performed at [facility] [address], [place], [place] [address]  Acetaminophen level     Status: Abnormal   Collection Time: 10/09/17  8:28 PM  Result Value Ref Range   Acetaminophen (Tylenol), Serum <10 (L) 10 - 30 ug/mL    Comment: (NOTE) Therapeutic concentrations vary significantly. A range of 10-30 ug/mL  may be an effective concentration for many patients. However, some  are best treated at concentrations outside of this range. Acetaminophen concentrations >150 ug/mL at 4 hours after ingestion  and >50 ug/mL at 12 hours after ingestion are often associated with  toxic reactions. Performed at [facility], [place] [address] [place], [place] [address]   cbc     Status: None   Collection Time: 10/09/17  8:28 PM  Result Value Ref Range   WBC 10.0 4.0 - 10.5 K/uL   RBC 5.04 3.87 - 5.11 MIL/uL   Hemoglobin 13.4 12.0 - 15.0 g/dL   HCT 42.0 36.0 - 46.0 %   MCV 83.3 78.0 - 100.0 fL   MCH 26.6 26.0 - 34.0 pg   MCHC 31.9 30.0 - 36.0 g/dL   RDW 13.6 11.5 - 15.5 %   Platelets 227 150 - 400 K/uL    Comment: Performed at [facility] [address] [place], [place] [address]  Rapid urine drug screen (hospital performed)     Status: Abnormal   Collection  Time: 10/09/17  8:41 PM  Result Value Ref Range   Opiates NONE DETECTED NONE DETECTED   Cocaine NONE DETECTED NONE DETECTED    Benzodiazepines NONE DETECTED NONE DETECTED   Amphetamines NONE DETECTED NONE DETECTED   Tetrahydrocannabinol NONE DETECTED NONE DETECTED   Barbiturates (A) NONE DETECTED    Result not available. Reagent lot number recalled by manufacturer.    Comment: Performed at [facility], [place] [address], [place], [place] [address]  I-Stat beta hCG blood, ED     Status: None   Collection Time: 10/09/17  8:52 PM  Result Value Ref Range   I-stat hCG, quantitative <5.0 <5 mIU/mL   Comment 3            Comment:   GEST. AGE      CONC.  (mIU/mL)   <=1 WEEK        5 - 50     2 WEEKS       50 - 500     3 WEEKS       100 - 10,000     4 WEEKS     1,000 - 30,000        FEMALE AND NON-PREGNANT FEMALE:     LESS THAN 5 mIU/mL     Current Facility-Administered Medications  Medication Dose Route Frequency Provider Last Rate Last Dose  . acetaminophen (TYLENOL) tablet 500 mg  500 mg Oral Once Leaphart, Kenneth T, PA-C      . busPIRone (BUSPAR) tablet 15 mg  15 mg Oral TID Patrecia Pour, NP      . DULoxetine (CYMBALTA) DR capsule 20 mg  20 mg Oral BID Patrecia Pour, NP      . fluvoxaMINE (LUVOX) tablet 50 mg  50 mg Oral QHS Patrecia Pour, NP      . glipiZIDE (GLUCOTROL) tablet 10 mg  10 mg Oral BID AC Patrecia Pour, NP      . hydrochlorothiazide (HYDRODIURIL) tablet 25 mg  25 mg Oral Daily Zakaiya Lares Y, NP      . insulin detemir (LEVEMIR) injection 40 Units  40 Units Subcutaneous BID Patrecia Pour, NP      . lamoTRIgine (LAMICTAL) tablet 25 mg  25 mg Oral QHS Patrecia Pour, NP      . paliperidone (INVEGA) 24 hr tablet 6 mg  6 mg Oral Daily Patrecia Pour, NP       Current Outpatient Medications  Medication Sig Dispense Refill  . busPIRone (BUSPAR) 15 MG tablet Take 1 tablet (15 mg total) by mouth 2 (two) times daily. For anxiety (Patient taking differently: Take 15 mg by mouth 3 (three) times daily. For anxiety) 60 tablet 0  . DULoxetine (CYMBALTA) 20 MG capsule Take 20 mg by mouth 2  (two) times daily.    . fluvoxaMINE (LUVOX) 100 MG tablet Take 100 mg by mouth 3 (three) times daily.    Marland Kitchen glipiZIDE (GLUCOTROL XL) 10 MG 24 hr tablet Take 1 tablet (10 mg total) by mouth every evening. For diabetes management (Patient not taking: Reported on 09/14/2017) 30 tablet 0  . glipiZIDE (GLUCOTROL) 10 MG tablet Take 10 mg by mouth 2 (two) times daily before a meal.    . hydrochlorothiazide (HYDRODIURIL) 25 MG tablet Take 1 TABLET BY MOUTH EVERY DAY for 30 days  1  . insulin detemir (LEVEMIR) 100 UNIT/ML injection Inject 40 Units into the skin 2 (two) times daily.    Marland Kitchen lamoTRIgine (  LAMICTAL) 25 MG tablet Take 25 mg by mouth at bedtime.  0  . paliperidone (INVEGA SUSTENNA) 234 MG/1.5ML SUSP injection Inject 234 mg every 30 (thirty) days into the muscle.      Musculoskeletal: Strength & Muscle Tone: within normal limits Gait & Station: normal Patient leans: N/A  Psychiatric Specialty Exam: Physical Exam  Nursing note and vitals reviewed. Constitutional: She is oriented to person, place, and time. She appears well-developed and well-nourished.  HENT:  Head: Normocephalic.  Neck: Normal range of motion.  Respiratory: Effort normal.  Musculoskeletal: Normal range of motion.  Neurological: She is alert and oriented to person, place, and time.  Psychiatric: She has a normal mood and affect. Stacy speech is normal and behavior is normal. Judgment and thought content normal. Cognition and memory are normal.    Review of Systems  Psychiatric/Behavioral: The patient is nervous/anxious.   All other systems reviewed and are negative.   Blood pressure 121/80, pulse 88, temperature 98.2 F (36.8 C), temperature source Oral, resp. rate 18, height '5\' 7"'  (1.702 m), weight 113.4 kg (250 lb), SpO2 99 %.Body mass index is 39.16 kg/m.  General Appearance: Casual  Eye Contact:  Good  Speech:  Normal Rate  Volume:  Normal  Mood:  Anxious, mild  Affect:  Blunt  Thought Process:  Coherent and  Descriptions of Associations: Intact  Orientation:  Full (Time, Place, and Person)  Thought Content:  WDL and Logical  Suicidal Thoughts:  No  Homicidal Thoughts:  No  Memory:  Immediate;   Good Recent;   Good Remote;   Good  Judgement:  Fair  Insight:  Fair  Psychomotor Activity:  Normal  Concentration:  Concentration: Good and Attention Span: Good  Recall:  Good  Fund of Knowledge:  Fair  Language:  Good  Akathisia:  No  Handed:  Right  AIMS (if indicated):     Assets:  Housing Leisure Time Physical Health Resilience Social Support  ADL's:  Intact  Cognition:  Impaired,  Mild  Sleep:        Treatment Plan Summary: Major depressive disorder, recurrent, mild: -Started Invega 6 mg daily for mood stabilization -Rx for Luvox to start tomorrow for depression, OCD behaviors, and anxiety -Follow-up with Stacy ACT team this week -Crisis plan in place  Disposition: No evidence of imminent risk to self or others at present.    Waylan Boga, NP 10/10/2017 3:14 PM

## 2017-10-10 NOTE — ED Triage Notes (Signed)
Romero Belling NP in room to see PT. Pt DC home

## 2017-10-10 NOTE — Discharge Instructions (Signed)

## 2017-10-10 NOTE — ED Notes (Addendum)
Patient was given meal tray. She has remained asleep for the last few hours.  Respiration 18 - normal breathing pattern.

## 2017-10-13 ENCOUNTER — Emergency Department (HOSPITAL_COMMUNITY)
Admission: EM | Admit: 2017-10-13 | Discharge: 2017-10-14 | Disposition: A | Discharge status: Home / Self Care | Payer: Medicaid Other | Attending: Emergency Medicine | Admitting: Emergency Medicine

## 2017-10-13 ENCOUNTER — Encounter (HOSPITAL_COMMUNITY): Payer: Self-pay

## 2017-10-13 ENCOUNTER — Other Ambulatory Visit: Payer: Self-pay

## 2017-10-13 DIAGNOSIS — Z046 Encounter for general psychiatric examination, requested by authority: Secondary | ICD-10-CM | POA: Diagnosis not present

## 2017-10-13 DIAGNOSIS — F314 Bipolar disorder, current episode depressed, severe, without psychotic features: Secondary | ICD-10-CM | POA: Diagnosis not present

## 2017-10-13 DIAGNOSIS — Z79899 Other long term (current) drug therapy: Secondary | ICD-10-CM | POA: Diagnosis not present

## 2017-10-13 DIAGNOSIS — Z794 Long term (current) use of insulin: Secondary | ICD-10-CM | POA: Insufficient documentation

## 2017-10-13 DIAGNOSIS — F17210 Nicotine dependence, cigarettes, uncomplicated: Secondary | ICD-10-CM | POA: Insufficient documentation

## 2017-10-13 DIAGNOSIS — R443 Hallucinations, unspecified: Secondary | ICD-10-CM | POA: Diagnosis present

## 2017-10-13 DIAGNOSIS — F330 Major depressive disorder, recurrent, mild: Secondary | ICD-10-CM | POA: Diagnosis present

## 2017-10-13 DIAGNOSIS — I10 Essential (primary) hypertension: Secondary | ICD-10-CM | POA: Insufficient documentation

## 2017-10-13 DIAGNOSIS — R45851 Suicidal ideations: Secondary | ICD-10-CM | POA: Diagnosis not present

## 2017-10-13 DIAGNOSIS — E119 Type 2 diabetes mellitus without complications: Secondary | ICD-10-CM | POA: Diagnosis not present

## 2017-10-13 LAB — CBC WITH DIFFERENTIAL/PLATELET
BASOS PCT: 0 %
Basophils Absolute: 0 10*3/uL (ref 0.0–0.1)
EOS ABS: 0.3 10*3/uL (ref 0.0–0.7)
EOS PCT: 3 %
HCT: 42.2 % (ref 36.0–46.0)
Hemoglobin: 14.2 g/dL (ref 12.0–15.0)
Lymphocytes Relative: 43 %
Lymphs Abs: 4.1 10*3/uL — ABNORMAL HIGH (ref 0.7–4.0)
MCH: 28.1 pg (ref 26.0–34.0)
MCHC: 33.6 g/dL (ref 30.0–36.0)
MCV: 83.4 fL (ref 78.0–100.0)
Monocytes Absolute: 0.5 10*3/uL (ref 0.1–1.0)
Monocytes Relative: 5 %
Neutro Abs: 4.6 10*3/uL (ref 1.7–7.7)
Neutrophils Relative %: 49 %
PLATELETS: 247 10*3/uL (ref 150–400)
RBC: 5.06 MIL/uL (ref 3.87–5.11)
RDW: 14 % (ref 11.5–15.5)
WBC: 9.6 10*3/uL (ref 4.0–10.5)

## 2017-10-13 LAB — COMPREHENSIVE METABOLIC PANEL
ALT: 28 U/L (ref 14–54)
ANION GAP: 11 (ref 5–15)
AST: 40 U/L (ref 15–41)
Albumin: 4.5 g/dL (ref 3.5–5.0)
Alkaline Phosphatase: 51 U/L (ref 38–126)
BUN: 8 mg/dL (ref 6–20)
CHLORIDE: 102 mmol/L (ref 101–111)
CO2: 27 mmol/L (ref 22–32)
Calcium: 9.6 mg/dL (ref 8.9–10.3)
Creatinine, Ser: 0.81 mg/dL (ref 0.44–1.00)
GFR calc non Af Amer: >60 mL/min (ref >60)
Glucose, Bld: 202 mg/dL — ABNORMAL HIGH (ref 65–99)
Potassium: 3.3 mmol/L — ABNORMAL LOW (ref 3.5–5.1)
SODIUM: 140 mmol/L (ref 135–145)
Total Bilirubin: 0.7 mg/dL (ref 0.3–1.2)
Total Protein: 7.6 g/dL (ref 6.5–8.1)

## 2017-10-13 LAB — RAPID URINE DRUG SCREEN, HOSP PERFORMED
Amphetamines: NOT DETECTED
BENZODIAZEPINES: NOT DETECTED
COCAINE: NOT DETECTED
Opiates: NOT DETECTED
Tetrahydrocannabinol: NOT DETECTED

## 2017-10-13 LAB — ETHANOL: Alcohol, Ethyl (B): <10 mg/dL (ref <10)

## 2017-10-13 LAB — ACETAMINOPHEN LEVEL

## 2017-10-13 LAB — I-STAT BETA HCG BLOOD, ED (MC, WL, AP ONLY): I-stat hCG, quantitative: <5 m[IU]/mL (ref <5)

## 2017-10-13 LAB — SALICYLATE LEVEL

## 2017-10-13 MED ORDER — POTASSIUM CHLORIDE CRYS ER 20 MEQ PO TBCR: 20.0000 meq | EXTENDED_RELEASE_TABLET | Freq: Once | ORAL | Status: DC

## 2017-10-13 MED ORDER — DULOXETINE HCL 20 MG PO CPEP
20.0000 mg | ORAL_CAPSULE | Freq: Two times a day (BID) | ORAL | Status: DC
  Administered 2017-10-14: 20 mg via ORAL
  Filled 2017-10-13 (×2): qty 1

## 2017-10-13 MED ORDER — ALUM & MAG HYDROXIDE-SIMETH 200-200-20 MG/5ML PO SUSP: 30.0000 mL | Freq: Four times a day (QID) | ORAL | Status: DC | PRN

## 2017-10-13 MED ORDER — HALOPERIDOL LACTATE 5 MG/ML IJ SOLN
5.0000 mg | Freq: Once | INTRAMUSCULAR | Status: AC
  Administered 2017-10-13: 5 mg via INTRAMUSCULAR
  Filled 2017-10-13: qty 1

## 2017-10-13 MED ORDER — HYDROCHLOROTHIAZIDE 25 MG PO TABS
25.0000 mg | ORAL_TABLET | Freq: Every day | ORAL | Status: DC
  Administered 2017-10-14: 25 mg via ORAL
  Filled 2017-10-13: qty 1

## 2017-10-13 MED ORDER — PALIPERIDONE ER 6 MG PO TB24
6.0000 mg | ORAL_TABLET | Freq: Every day | ORAL | Status: DC
  Filled 2017-10-13: qty 1

## 2017-10-13 MED ORDER — ACETAMINOPHEN 325 MG PO TABS: 650.0000 mg | ORAL_TABLET | ORAL | Status: DC | PRN

## 2017-10-13 MED ORDER — ZOLPIDEM TARTRATE 5 MG PO TABS: 5.0000 mg | ORAL_TABLET | Freq: Every evening | ORAL | Status: DC | PRN

## 2017-10-13 MED ORDER — LAMOTRIGINE 100 MG PO TABS
100.0000 mg | ORAL_TABLET | Freq: Every day | ORAL | Status: DC
  Administered 2017-10-14: 100 mg via ORAL
  Filled 2017-10-13: qty 1

## 2017-10-13 MED ORDER — GLIPIZIDE 10 MG PO TABS
10.0000 mg | ORAL_TABLET | Freq: Two times a day (BID) | ORAL | Status: DC
  Administered 2017-10-14: 10 mg via ORAL
  Filled 2017-10-13: qty 1

## 2017-10-13 MED ORDER — ONDANSETRON HCL 4 MG PO TABS: 4.0000 mg | ORAL_TABLET | Freq: Three times a day (TID) | ORAL | Status: DC | PRN

## 2017-10-13 MED ORDER — LORAZEPAM 2 MG/ML IJ SOLN
2.0000 mg | Freq: Once | INTRAMUSCULAR | Status: AC
  Administered 2017-10-13: 2 mg via INTRAMUSCULAR
  Filled 2017-10-13: qty 1

## 2017-10-13 MED ORDER — DIPHENHYDRAMINE HCL 50 MG/ML IJ SOLN
50.0000 mg | Freq: Once | INTRAMUSCULAR | Status: AC
  Administered 2017-10-13: 50 mg via INTRAMUSCULAR
  Filled 2017-10-13: qty 1

## 2017-10-13 MED ORDER — INSULIN DETEMIR 100 UNIT/ML ~~LOC~~ SOLN
40.0000 [IU] | Freq: Two times a day (BID) | SUBCUTANEOUS | Status: DC
  Administered 2017-10-13 – 2017-10-14 (×2): 40 [IU] via SUBCUTANEOUS
  Filled 2017-10-13 (×2): qty 0.4

## 2017-10-13 NOTE — ED Notes (Addendum)
Pt to room #28. Pt loud, verbally aggressive, hostile, paranoid. Pt punching the bed. "I want to get out of this body."  [employer] and security present. Pt demanding medication to help with thoughts. Will continue to monitor.

## 2017-10-13 NOTE — ED Notes (Signed)
EDP at bedside  

## 2017-10-13 NOTE — ED Triage Notes (Addendum)
Patient states she is suicidal and her plan is to overdose. Patient states she overdosed on Cymbalta and Buspar 4 days ago. Patient states her medication isn't working. Patient yelling out that she will overdose on insulin if she does not get help. Patient denies any hallucinations-visual or auditory. Patient yells out that she sleeps with the blinds up because she is too scared. Patient denies any alcohol or drug use. Patient was brought in by [facility] officers x 3.

## 2017-10-13 NOTE — ED Provider Notes (Signed)
[facility] DEPT Provider Note   CSN: [id number] Arrival date & time: 10/13/17  1744     History   Chief Complaint Chief Complaint  Patient presents with  . Suicidal    HPI [name] is a 27 y.o. female.  Brought in by [facility] police for suicidal ideation.  Patient states that she was seen here in the ER a few days ago when she overdosed on her medications, states that her medications are not helping her that she feels like she needs to sleep on her living room sofa with the blinds open at night.  Patient reports plan to overdose again with suicidal ideation.  Patient has shaved her eyebrows and her hair and requests multiple times "get me out of my body."  Denies homicidal ideation, drug or alcohol use, auditory or visual hallucinations.  No other complaints or concerns.  HPI  Past Medical History:  Diagnosis Date  . Anxiety   . Bipolar 1 disorder ([place])   . Cognitive deficits   . Depression   . Diabetes mellitus without complication ([place])   . Hypertension   . Mental disorder   . Obesity     Patient Active Problem List   Diagnosis Date Noted  . Psychosis ([place])   . 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 LGSIL on Pap smear of cervix 02/01/2017  . OCD (obsessive compulsive disorder) 10/05/2016  . Major depressive disorder, recurrent episode, mild ([place]) 05/04/2016  . 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 Learning disability 07/18/2015  . 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 Diabetes mellitus ([place]) 07/18/2015  . MDD (major depressive disorder), recurrent, severe, with psychosis ([place]) 07/18/2015  . Hyperlipidemia 07/18/2015  . Severe episode of recurrent major depressive disorder, without psychotic features ([place])   . Drug overdose   . Cognitive deficits 10/12/2012  . Generalized anxiety disorder 06/28/2012    Past Surgical History:  Procedure Laterality Date  . CESAREAN  SECTION    . CESAREAN SECTION N/A 04/25/2013   Procedure: REPEAT CESAREAN SECTION;  Surgeon: [name], MD;  Location: [facility];  Service: Obstetrics;  Laterality: N/A;  . MASS EXCISION N/A 06/03/2012   Procedure: EXCISION MASS;  Surgeon: [name], MD;  Location: [facility];  Service: ENT;  Laterality: N/A;  Excision uvula mass  . TONSILLECTOMY N/A 06/03/2012   Procedure: TONSILLECTOMY;  Surgeon: [name], MD;  Location: [facility];  Service: ENT;  Laterality: N/A;  . TONSILLECTOMY       OB History    Gravida  3   Para  3   Term  3   Preterm  0   AB  0   Living  3     SAB  0   TAB  0   Ectopic  0   Multiple  0   Live Births  3            Home Medications    Prior to Admission medications   Medication Sig Start Date End Date Taking? Authorizing Provider  busPIRone (BUSPAR) 15 MG tablet Take 1 tablet (15 mg total) by mouth 2 (two) times daily. For anxiety Patient taking differently: Take 15 mg by mouth 3 (three) times daily. For anxiety 10/06/16  Yes Nwoko, Herbert Pun I, NP  DULoxetine (CYMBALTA) 20 MG capsule Take 20 mg by mouth 2 (two) times daily.   Yes [provider]  glipiZIDE (GLUCOTROL) 10 MG tablet Take 10 mg  by mouth 2 (two) times daily before a meal.   Yes [provider]  hydrochlorothiazide (HYDRODIURIL) 25 MG tablet Take 1 TABLET BY MOUTH EVERY DAY for 30 days 02/15/17  Yes [provider]  insulin detemir (LEVEMIR) 100 UNIT/ML injection Inject 40 Units into the skin 2 (two) times daily.   Yes [provider]  lamoTRIgine (LAMICTAL) 100 MG tablet Take 1/2 - 1 tablet by mouth at bedtime start taking half a tablet on May 22nd and then a whole tablet at bedtime on June 5th 09/28/17  Yes [provider]  paliperidone (INVEGA SUSTENNA) 234 MG/1.5ML SUSP injection Inject 234 mg every 30 (thirty) days into the muscle.   Yes [provider]  paliperidone (INVEGA) 6 MG 24 hr  tablet Take 1 tablet (6 mg total) by mouth daily. 10/10/17  Yes Patrecia Pour, NP  fluvoxaMINE (LUVOX) 50 MG tablet Take 1 tablet (50 mg total) by mouth at bedtime. Patient not taking: Reported on 10/13/2017 10/10/17   Patrecia Pour, NP    Family History Family History  Problem Relation Age of Onset  . Hypertension Mother   . Diabetes Father     Social History Social History   Tobacco Use  . Smoking status: Current Every Day Smoker    Packs/day: 1.00    Years: 11.00    Pack years: 11.00    Types: Cigarettes  . Smokeless tobacco: Never Used  Substance Use Topics  . Alcohol use: No  . Drug use: No    Comment: Patient denies     Allergies   Wellbutrin [bupropion]; Omnipaque [iohexol]; Penicillins; and Depakote er [divalproex sodium er]   Review of Systems Review of Systems  Constitutional: Negative for chills and fever.  Eyes: Negative for visual disturbance.  Respiratory: Negative for shortness of breath.   Cardiovascular: Negative for chest pain.  Gastrointestinal: Negative for abdominal pain, constipation, diarrhea, nausea and vomiting.  Genitourinary: Negative for difficulty urinating, dysuria and frequency.  Musculoskeletal: Negative for arthralgias and myalgias.  Skin: Negative for rash and wound.  Allergic/Immunologic: Negative for immunocompromised state.  Neurological: Negative for weakness and headaches.  Hematological: Does not bruise/bleed easily.  Psychiatric/Behavioral: Positive for agitation, behavioral problems, sleep disturbance and suicidal ideas. Negative for confusion and hallucinations.  All other systems reviewed and are negative.    Physical Exam Updated Vital Signs BP 137/86 (BP Location: Left Arm)   Pulse (!) 101   Temp 98.3 F (36.8 C) (Oral)   Resp 18   Ht 5\' 7"  (1.702 m)   Wt 113.4 kg (250 lb)   SpO2 100%   BMI 39.16 kg/m   Physical Exam  Constitutional: She is oriented to person, place, and time. She appears well-developed  and well-nourished.  HENT:  Head: Normocephalic and atraumatic.  Eyes: Conjunctivae are normal.  Cardiovascular: Normal rate and regular rhythm.  No murmur heard. Pulmonary/Chest: Effort normal and breath sounds normal. No respiratory distress.  Neurological: She is alert and oriented to person, place, and time.  Skin: Skin is warm and dry. No rash noted.  Psychiatric: Her affect is angry, labile and inappropriate. Her speech is rapid and/or pressured. She is agitated. She is not actively hallucinating. Thought content is paranoid. Cognition and memory are normal. She expresses suicidal ideation. She expresses suicidal plans. She is attentive.  Nursing note and vitals reviewed.    ED Treatments / Results  Labs (all labs ordered are listed, but only abnormal results are displayed) Labs Reviewed  CBC WITH  DIFFERENTIAL/PLATELET - Abnormal; Notable for the following components:      Result Value   Lymphs Abs 4.1 (*)    All other components within normal limits  COMPREHENSIVE METABOLIC PANEL - Abnormal; Notable for the following components:   Potassium 3.3 (*)    Glucose, Bld 202 (*)    All other components within normal limits  ACETAMINOPHEN LEVEL - Abnormal; Notable for the following components:   Acetaminophen (Tylenol), Serum <10 (*)    All other components within normal limits  RAPID URINE DRUG SCREEN, HOSP PERFORMED - Abnormal; Notable for the following components:   Barbiturates   (*)    Value: Result not available. Reagent lot number recalled by manufacturer.   All other components within normal limits  ETHANOL  SALICYLATE LEVEL  I-STAT BETA HCG BLOOD, ED ([facility], [facility], [facility] ONLY)    EKG None  Radiology No results found.  Procedures Procedures (including critical care time)  Medications Ordered in ED Medications  acetaminophen (TYLENOL) tablet 650 mg (has no administration in time range)  zolpidem (AMBIEN) tablet 5 mg (has no administration in time range)  ondansetron  (ZOFRAN) tablet 4 mg (has no administration in time range)  alum & mag hydroxide-simeth (MAALOX/MYLANTA) 200-200-20 MG/5ML suspension 30 mL (has no administration in time range)  DULoxetine (CYMBALTA) DR capsule 20 mg (has no administration in time range)  glipiZIDE (GLUCOTROL) tablet 10 mg (has no administration in time range)  hydrochlorothiazide (HYDRODIURIL) tablet 25 mg (has no administration in time range)  insulin detemir (LEVEMIR) injection 40 Units (40 Units Subcutaneous Given 10/13/17 2132)  lamoTRIgine (LAMICTAL) tablet 100 mg (has no administration in time range)  paliperidone (INVEGA) 24 hr tablet 6 mg (has no administration in time range)  potassium chloride SA (K-DUR,KLOR-CON) CR tablet 20 mEq (has no administration in time range)  LORazepam (ATIVAN) injection 2 mg (2 mg Intramuscular Given 10/13/17 1838)  haloperidol lactate (HALDOL) injection 5 mg (5 mg Intramuscular Given 10/13/17 1839)  diphenhydrAMINE (BENADRYL) injection 50 mg (50 mg Intramuscular Given 10/13/17 1840)     Initial Impression / Assessment and Plan / ED Course  I have reviewed the triage vital signs and the nursing notes.  Pertinent labs & imaging results that were available during my care of the patient were reviewed by me and considered in my medical decision making (see chart for details).  Clinical Course as of Oct 14 2234  Wed Jun 19, 371  3283 27 year old female brought in by [facility] police for suicidal ideation.  Patient is bipolar, recently seen in this ER for suicidal attempt by overdose, took her prescribed medications.  Patient states that her medications are not helping, reports feeling paranoid.  Denies homicidal ideation, auditory visual hallucination, drug or alcohol use.  Patient was IVC, seen by Dr. Darl Householder who has also ordered medications for her as she is loud and disruptive today.  No medical complaints otherwise, review of screening lab work shows mild hypokalemia with potassium at 3.3.   Patient is medically cleared for behavioral health evaluation.   [LM]  2236 Care signed out to Dellia Beckwith, PA-C pending TTS assessment.    [LM]    Clinical Course User Index [LM] Tacy Learn, PA-C     Final Clinical Impressions(s) / ED Diagnoses   Final diagnoses:  Suicidal ideation    ED Discharge Orders    None       Roque Lias 10/13/17 2236    Drenda Freeze, MD 10/13/17 2300

## 2017-10-13 NOTE — ED Notes (Signed)
Bed: ID78 Expected date:  Expected time:  Means of arrival:  Comments: tr3

## 2017-10-13 NOTE — ED Notes (Signed)
Bed: [facility] Expected date:  Expected time:  Means of arrival:  Comments: 

## 2017-10-13 NOTE — BH Assessment (Signed)
[facility] Assessment Progress Note   Clinician called TCU and spoke to [place] who said that patient had been sedated around 18:38.  Pt was given benedryl, haldol, ativan.  TTS will attempt to see pt after she is alert and oriented.

## 2017-10-14 NOTE — BH Assessment (Signed)
[facility] Assessment Progress Note  Per Buford Dresser, DO, this pt does not require psychiatric hospitalization at this time.  Pt presents under IVC initiated by EDP Shirlyn Goltz, MD, which Dr Mariea Clonts has rescinded.  Pt is to be discharged from [facility] with recommendation to continue treatment with the [facility] Team.  This has been included in pt's discharge instructions.  Pt's nurse has been notified.  Jalene Mullet, [place] [facility] Triage Specialist [phone]

## 2017-10-14 NOTE — BHH Counselor (Signed)
Disposition:   Per Buford Dresser, DO, and Jinny Blossom, NP, this pt does not require psychiatric hospitalization at this time.  Pt is to be discharged from [facility] with recommendation to continue treatment with the [facility] CST Team.

## 2017-10-14 NOTE — ED Notes (Signed)
TTS at bedside. 

## 2017-10-14 NOTE — BHH Suicide Risk Assessment (Cosign Needed)
Suicide Risk Assessment  Discharge Assessment   [facility] Discharge Suicide Risk Assessment   Principal Problem: Major depressive disorder, recurrent episode, mild ([place]) Discharge Diagnoses:  Patient Active Problem List   Diagnosis Date Noted  . Psychosis ([place]) [F29]   . Adjustment disorder with mixed disturbance of emotions and conduct [F43.25] 08/03/2017  . LGSIL on Pap smear of cervix [R87.612] 02/01/2017  . OCD (obsessive compulsive disorder) [F42.9] 10/05/2016  . Major depressive disorder, recurrent episode, mild ([place]) [F33.0] 05/04/2016  . 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 Learning disability [F81.9] 07/18/2015  . 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 Diabetes mellitus ([place]) [E11.9] 07/18/2015  . MDD (major depressive disorder), recurrent, severe, with psychosis ([place]) [F33.3] 07/18/2015  . Hyperlipidemia [E78.5] 07/18/2015  . Severe episode of recurrent major depressive disorder, without psychotic features ([place]) [F33.2]   . Drug overdose [T50.901A]   . Cognitive deficits [R41.89] 10/12/2012  . Generalized anxiety disorder [F41.1] 06/28/2012   Pt was seen and chart reviewed with treatment team and Dr Mariea Clonts.Pt is well known to this ED and has had 27 visits in the past 6 months. Pt presents claiming she tried to commit suicide and she wants to live in a group home. Pt stated she lives alone but does not want to. Pt frequently states that she feels like people are trying to harm her and thinks this will get her what she wants. Pt has a Hx of IDD but IQ is not low enough, per [facility] requirements,  to receive the type of services she is requesting. Pt has community support through [facility]. Pt's UDS and BAL were negative. Pt is chronically suicidal and has a high degree of secondary gain by seeking companionship in the emergency room. Pt is stable and psychiatrically clear for discharge.   Total Time spent with patient: 30 minutes  Musculoskeletal: Strength &  Muscle Tone: within normal limits Gait & Station: normal Patient leans: N/A  Psychiatric Specialty Exam:   Blood pressure (!) 98/55, pulse 64, temperature (!) 97.5 F (36.4 C), temperature source Oral, resp. rate 19, height 5\' 7"  (1.702 m), weight 250 lb (113.4 kg), SpO2 95 %.Body mass index is 39.16 kg/m.  General Appearance: Casual  Eye Contact::  Fair  Speech:  Clear and Coherent and Normal Rate409  Volume:  Normal  Mood:  Depressed  Affect:  Congruent and Depressed  Thought Process:  Coherent, Goal Directed and Linear  Orientation:  Full (Time, Place, and Person)  Thought Content:  Logical  Suicidal Thoughts:  No  Homicidal Thoughts:  No  Memory:  Immediate;   Good Recent;   Good Remote;   Fair  Judgement:  Poor  Insight:  Shallow  Psychomotor Activity:  Normal  Concentration:  Good  Recall:  Good  Fund of Knowledge:Good  Language: Good  Akathisia:  No  Handed:  Right  AIMS (if indicated):     Assets:  Agricultural consultant Housing Social Support  Sleep:     Cognition: WNL  ADL's:  Intact   Mental Status Per Nursing Assessment::   On Admission:   chronic suicidal ideation  Demographic Factors:  Adolescent or young adult, Low socioeconomic status and Living alone  Loss Factors: Financial problems/change in socioeconomic status  Historical Factors: Impulsivity  Risk Reduction Factors:   Sense of responsibility to family  Continued Clinical Symptoms:  Severe Anxiety and/or Agitation Depression:   Impulsivity  Cognitive Features That Contribute To Risk:  Closed-mindedness    Suicide Risk:  Minimal:  No identifiable suicidal ideation.  Patients presenting with no risk factors but with morbid ruminations; may be classified as minimal risk based on the severity of the depressive symptoms    Plan Of Care/Follow-up recommendations:  Activity:  as tolerated  Diet:  Heart healthy  [name], NP 10/14/2017, 9:30  AM

## 2017-10-14 NOTE — ED Provider Notes (Signed)
10:12 AM Patient has been cleared for discharge by psychiatry.    Sherwood Gambler, MD 10/14/17 1015

## 2017-10-14 NOTE — BH Assessment (Signed)
Contacted RN to do assessment on pt and RN Shirlean Mylar stated the pt is still sedated.  TTS will attempt to complete assessment when she is alert.

## 2017-10-14 NOTE — BH Assessment (Signed)
Tele Assessment Note   Patient Name: [name] MRN: [id number] Referring Physician: Roque Lias Location of Patient: [facility] Location of Provider: [place] is an 27 y.o. female who returns to the ED with suicidal ideations no intent. Patient report she has been having suicidal thoughts past few days triggered by living alone. Patient report, "I want to live in a group home." Patient discussed she lives alone. Report she lives in rough neighborhood and does not feel safe. Report she wants to live in the group home where she can be around other people and not feel afraid. Patient denies supportive family and friends. Denies substance use and patient UDS's are negative. Patient report he sleep has decreased due to she's afraid to sleep. Patient denies homicidal ideations, denies visual / auditory hallucinations. Patient reported to Percell Miller, PA-C she was experiencing hallucinations but denies then to this TTS Probation officer.   Patient presented with a flat empty affect. Patient is responding to internal stimuli. Patient belongs to a Conservation officer, nature with [facility]. Report she has informed them of her join to live in a group. Report she and members of the team are scheduled to have a meeting.   Diagnosis:  F31.4  Bipolar I disorder, Current or most recent episode depressed, Severe   Past Medical History:  Past Medical History:  Diagnosis Date  . Anxiety   . Bipolar 1 disorder ([name])   . Cognitive deficits   . Depression   . Diabetes mellitus without complication ([place])   . Hypertension   . Mental disorder   . Obesity     Past Surgical History:  Procedure Laterality Date  . CESAREAN SECTION    . CESAREAN SECTION N/A 04/25/2013   Procedure: REPEAT CESAREAN SECTION;  Surgeon: [name], MD;  Location: [facility];  Service: Obstetrics;  Laterality: N/A;  . MASS EXCISION N/A 06/03/2012   Procedure: EXCISION MASS;  Surgeon: [name], MD;   Location: [facility];  Service: ENT;  Laterality: N/A;  Excision uvula mass  . TONSILLECTOMY N/A 06/03/2012   Procedure: TONSILLECTOMY;  Surgeon: [name], MD;  Location: [facility];  Service: ENT;  Laterality: N/A;  . TONSILLECTOMY      Family History:  Family History  Problem Relation Age of Onset  . Hypertension Mother   . Diabetes Father     Social History:  reports that she has been smoking cigarettes.  She has a 11.00 pack-year smoking history. She has never used smokeless tobacco. She reports that she does not drink alcohol or use drugs.  Additional Social History:  Alcohol / Drug Use Pain Medications: see MAR Prescriptions: see MAR Over the Counter: see MAR History of alcohol / drug use?: No history of alcohol / drug abuse Longest period of sobriety (when/how long): NA Substance #2 2 - Age of First Use: UTA 2 - Amount (size/oz): Pt reported, smoking a pack of cigarettes, daily. 2 - Frequency: Daily.  2 - Duration: Ongoing.  2 - Last Use / Amount: Pt reported, daily.   CIWA: CIWA-Ar BP: (!) 98/55 Pulse Rate: 64 COWS:    Allergies:  Allergies  Allergen Reactions  . Wellbutrin [Bupropion] Shortness Of Breath  . Omnipaque [Iohexol] Swelling and Other (See Comments)    Reaction:  Eye swelling  . Penicillins Hives and Other (See Comments)    Has patient had a PCN reaction causing immediate rash, facial/tongue/throat swelling, SOB or lightheadedness with hypotension:  No Has patient had a PCN reaction causing severe rash involving mucus membranes or skin necrosis: No Has patient had a PCN reaction that required hospitalization No Has patient had a PCN reaction occurring within the last 10 years: No If all of the above answers are "NO", then may proceed with Cephalosporin use.  . Depakote Er [Divalproex Sodium Er] Nausea And Vomiting    Home Medications:  (Not in a hospital admission)  OB/GYN Status:  No LMP recorded. Patient has  had an implant.  General Assessment Data Assessment unable to be completed: Yes Reason for not completing assessment: Patient sedation [facility] Assessment: [facility] TTS Assessment: In system Is this a Tele or Face-to-Face Assessment?: Face-to-Face Is this an Initial Assessment or a Re-assessment for this encounter?: Initial Assessment Marital status: Single Living Arrangements: Alone Can pt return to current living arrangement?: Yes Admission Status: Involuntary Is patient capable of signing voluntary admission?: Yes Referral Source: Self/Family/Friend Insurance type: Medicaid      Crisis Care Plan Living Arrangements: Alone Legal Guardian: Other:(self) Name of Psychiatrist: [facility] Name of Therapist: [facility]  Education Status Is patient currently in school?: No Highest grade of school patient has completed: 11 Is the patient employed, unemployed or receiving disability?: Receiving disability income  Risk to self with the past 6 months Suicidal Ideation: Yes-Currently Present(pt report feeling suicidal triggered by scared to live alone) Has patient been a risk to self within the past 6 months prior to admission? : Yes Suicidal Intent: No-Not Currently/Within Last 6 Months Has patient had any suicidal intent within the past 6 months prior to admission? : Yes Is patient at risk for suicide?: Yes(pt paranoid and afraid to live alone ) Suicidal Plan?: No Has patient had any suicidal plan within the past 6 months prior to admission? : Yes(pt report on 6/16 she attempted to overdose) Specify Current Suicidal Plan: pt denies plan  Access to Means: No Specify Access to Suicidal Means: pt denies What has been your use of drugs/alcohol within the last 12 months?: pt denies Previous Attempts/Gestures: Yes How many times?: (many ) Other Self Harm Risks: no report  Triggers for Past Attempts: Unpredictable Intentional Self Injurious Behavior: None Family Suicide History: No Recent  stressful life event(s): Other (Comment)(pt afraid of living alone ) Persecutory voices/beliefs?: No Depression: Yes Depression Symptoms: Feeling worthless/self pity Substance abuse history and/or treatment for substance abuse?: No Suicide prevention information given to non-admitted patients: Not applicable  Risk to Others within the past 6 months Homicidal Ideation: No Does patient have any lifetime risk of violence toward others beyond the six months prior to admission? : No Thoughts of Harm to Others: No Current Homicidal Intent: No Current Homicidal Plan: No Access to Homicidal Means: No Identified Victim: n/a History of harm to others?: No Assessment of Violence: None Noted Violent Behavior Description: n/a Does patient have access to weapons?: No Criminal Charges Pending?: No Does patient have a court date: No Is patient on probation?: No  Psychosis Hallucinations: None noted Delusions: None noted  Mental Status Report Appearance/Hygiene: In scrubs, Unremarkable Eye Contact: Good Motor Activity: Freedom of movement Speech: Logical/coherent Level of Consciousness: Alert Mood: Empty Affect: Other (Comment)(pt responding to internal stimuli - self-pity-sad affect) Anxiety Level: None Thought Processes: Coherent, Relevant Judgement: Unimpaired Orientation: Person, Place, Time, Situation Obsessive Compulsive Thoughts/Behaviors: None  Cognitive Functioning Concentration: Normal Memory: Recent Intact, Remote Intact Is patient IDD: No Is patient DD?: No I IQ score available?: No Insight: Fair Impulse Control: Poor Appetite:  Fair Have you had any weight changes? : No Change Sleep: Decreased Total Hours of Sleep: (varies) Vegetative Symptoms: None  ADLScreening [facility] Assessment Services) Patient's cognitive ability adequate to safely complete daily activities?: Yes Patient able to express need for assistance with ADLs?: Yes Independently performs ADLs?: Yes  (appropriate for developmental age)  Prior Inpatient Therapy Prior Inpatient Therapy: Yes Prior Therapy Dates: MULTIPLE Prior Therapy Facilty/Provider(s): MULTIPLE: [facility], [facility], [facility], [facility] Reason for Treatment: SI, MDD  Prior Outpatient Therapy Prior Outpatient Therapy: Yes Prior Therapy Dates: CURRENT Prior Therapy Facilty/Provider(s): [facility] OR [facility] Reason for Treatment: MED MGMT & COUNSELING Does patient have an ACCT team?: No Does patient have Intensive In-House Services?  : No Does patient have [facility] services? : No Does patient have [facility] services?: No  ADL Screening (condition at time of admission) Patient's cognitive ability adequate to safely complete daily activities?: Yes Is the patient deaf or have difficulty hearing?: No Does the patient have difficulty seeing, even when wearing glasses/contacts?: No Does the patient have difficulty concentrating, remembering, or making decisions?: No Patient able to express need for assistance with ADLs?: Yes Does the patient have difficulty dressing or bathing?: No Independently performs ADLs?: Yes (appropriate for developmental age) Does the patient have difficulty walking or climbing stairs?: No       Abuse/Neglect Assessment (Assessment to be complete while patient is alone) Abuse/Neglect Assessment Can Be Completed: Yes Physical Abuse: Denies Verbal Abuse: Denies Sexual Abuse: Denies Exploitation of patient/patient's resources: Denies Self-Neglect: Denies     Regulatory affairs officer (For Healthcare) Does Patient Have a Medical Advance Directive?: No Would patient like information on creating a medical advance directive?: No - Patient declined          Disposition:  Disposition Initial Assessment Completed for this Encounter: (Dr. Mariea Clonts and Romilda Garret, NP, recommend D/C) Patient referred to: Other (Comment)   Kamaryn Grimley 10/14/2017 9:31 AM

## 2017-10-14 NOTE — Discharge Instructions (Addendum)
For your behavioral health needs, you are advised to continue treatment with the [facility]:       [facility]      [facility], [address]      [address]      [place], [place]  [address]      [phone]      Crisis number: [phone]

## 2017-10-14 NOTE — ED Notes (Signed)
Pt is asleep, however easily arousable to verbal stimuli.

## 2017-10-14 NOTE — ED Notes (Signed)
Breakfast given.  

## 2017-10-18 ENCOUNTER — Encounter (HOSPITAL_COMMUNITY): Payer: Self-pay | Admitting: Family Medicine

## 2017-10-18 ENCOUNTER — Emergency Department (HOSPITAL_COMMUNITY)
Admission: EM | Admit: 2017-10-18 | Discharge: 2017-10-19 | Disposition: A | Discharge status: Home / Self Care | Payer: Medicaid Other | Attending: Emergency Medicine | Admitting: Emergency Medicine

## 2017-10-18 DIAGNOSIS — Z79899 Other long term (current) drug therapy: Secondary | ICD-10-CM | POA: Diagnosis not present

## 2017-10-18 DIAGNOSIS — F17210 Nicotine dependence, cigarettes, uncomplicated: Secondary | ICD-10-CM | POA: Diagnosis not present

## 2017-10-18 DIAGNOSIS — F330 Major depressive disorder, recurrent, mild: Secondary | ICD-10-CM | POA: Diagnosis not present

## 2017-10-18 DIAGNOSIS — F314 Bipolar disorder, current episode depressed, severe, without psychotic features: Secondary | ICD-10-CM | POA: Diagnosis not present

## 2017-10-18 DIAGNOSIS — R450 Nervousness: Secondary | ICD-10-CM | POA: Diagnosis not present

## 2017-10-18 DIAGNOSIS — I10 Essential (primary) hypertension: Secondary | ICD-10-CM | POA: Insufficient documentation

## 2017-10-18 DIAGNOSIS — R5381 Other malaise: Secondary | ICD-10-CM | POA: Diagnosis not present

## 2017-10-18 DIAGNOSIS — Z794 Long term (current) use of insulin: Secondary | ICD-10-CM | POA: Insufficient documentation

## 2017-10-18 DIAGNOSIS — F411 Generalized anxiety disorder: Secondary | ICD-10-CM | POA: Diagnosis present

## 2017-10-18 DIAGNOSIS — E119 Type 2 diabetes mellitus without complications: Secondary | ICD-10-CM | POA: Insufficient documentation

## 2017-10-18 DIAGNOSIS — F329 Major depressive disorder, single episode, unspecified: Secondary | ICD-10-CM | POA: Diagnosis present

## 2017-10-18 LAB — COMPREHENSIVE METABOLIC PANEL
ALK PHOS: 50 U/L (ref 38–126)
ALT: 29 U/L (ref 14–54)
ANION GAP: 11 (ref 5–15)
AST: 35 U/L (ref 15–41)
Albumin: 4.1 g/dL (ref 3.5–5.0)
BILIRUBIN TOTAL: 0.6 mg/dL (ref 0.3–1.2)
BUN: 13 mg/dL (ref 6–20)
CALCIUM: 9.6 mg/dL (ref 8.9–10.3)
CO2: 28 mmol/L (ref 22–32)
Chloride: 100 mmol/L — ABNORMAL LOW (ref 101–111)
Creatinine, Ser: 0.8 mg/dL (ref 0.44–1.00)
GFR calc Af Amer: >60 mL/min (ref >60)
GLUCOSE: 260 mg/dL — AB (ref 65–99)
Potassium: 3.7 mmol/L (ref 3.5–5.1)
Sodium: 139 mmol/L (ref 135–145)
TOTAL PROTEIN: 7.4 g/dL (ref 6.5–8.1)

## 2017-10-18 LAB — CBC
HEMATOCRIT: 41 % (ref 36.0–46.0)
Hemoglobin: 13.9 g/dL (ref 12.0–15.0)
MCH: 28.1 pg (ref 26.0–34.0)
MCHC: 33.9 g/dL (ref 30.0–36.0)
MCV: 82.8 fL (ref 78.0–100.0)
Platelets: 231 10*3/uL (ref 150–400)
RBC: 4.95 MIL/uL (ref 3.87–5.11)
RDW: 13.9 % (ref 11.5–15.5)
WBC: 9 10*3/uL (ref 4.0–10.5)

## 2017-10-18 LAB — RAPID URINE DRUG SCREEN, HOSP PERFORMED
Amphetamines: NOT DETECTED
Benzodiazepines: NOT DETECTED
COCAINE: NOT DETECTED
Opiates: NOT DETECTED
TETRAHYDROCANNABINOL: NOT DETECTED

## 2017-10-18 LAB — ACETAMINOPHEN LEVEL

## 2017-10-18 LAB — I-STAT BETA HCG BLOOD, ED (MC, WL, AP ONLY): I-stat hCG, quantitative: <5 m[IU]/mL (ref <5)

## 2017-10-18 LAB — SALICYLATE LEVEL

## 2017-10-18 LAB — CBG MONITORING, ED: GLUCOSE-CAPILLARY: 258 mg/dL — AB (ref 65–99)

## 2017-10-18 LAB — ETHANOL: Alcohol, Ethyl (B): <10 mg/dL (ref <10)

## 2017-10-18 MED ORDER — BUSPIRONE HCL 10 MG PO TABS
15.0000 mg | ORAL_TABLET | Freq: Three times a day (TID) | ORAL | Status: DC
  Administered 2017-10-18 – 2017-10-19 (×2): 15 mg via ORAL
  Filled 2017-10-18 (×2): qty 2

## 2017-10-18 MED ORDER — HYDROCHLOROTHIAZIDE 25 MG PO TABS
25.0000 mg | ORAL_TABLET | Freq: Every day | ORAL | Status: DC
  Filled 2017-10-18: qty 1

## 2017-10-18 MED ORDER — DULOXETINE HCL 20 MG PO CPEP
20.0000 mg | ORAL_CAPSULE | Freq: Two times a day (BID) | ORAL | Status: DC
  Administered 2017-10-18 – 2017-10-19 (×2): 20 mg via ORAL
  Filled 2017-10-18 (×2): qty 1

## 2017-10-18 MED ORDER — GLIPIZIDE 10 MG PO TABS
10.0000 mg | ORAL_TABLET | Freq: Two times a day (BID) | ORAL | Status: DC
  Administered 2017-10-19: 10 mg via ORAL
  Filled 2017-10-18 (×2): qty 1

## 2017-10-18 MED ORDER — PALIPERIDONE ER 6 MG PO TB24
6.0000 mg | ORAL_TABLET | Freq: Every day | ORAL | Status: DC
  Filled 2017-10-18: qty 1

## 2017-10-18 MED ORDER — FLUVOXAMINE MALEATE 50 MG PO TABS
50.0000 mg | ORAL_TABLET | Freq: Every day | ORAL | Status: DC
  Administered 2017-10-18: 50 mg via ORAL
  Filled 2017-10-18: qty 1

## 2017-10-18 MED ORDER — INSULIN DETEMIR 100 UNIT/ML ~~LOC~~ SOLN
40.0000 [IU] | Freq: Two times a day (BID) | SUBCUTANEOUS | Status: DC
  Administered 2017-10-18 – 2017-10-19 (×2): 40 [IU] via SUBCUTANEOUS
  Filled 2017-10-18 (×3): qty 0.4

## 2017-10-18 NOTE — ED Provider Notes (Signed)
[facility] DEPT Provider Note   CSN: [id number] Arrival date & time: 10/18/17  1919     History   Chief Complaint Chief Complaint  Patient presents with  . Psychiatric Evaluation    HPI [name] is a 27 y.o. female.  HPI  [name] is a 27 year old female with a history of bipolar disorder, cognitive impairment, type 2 diabetes, hypertension, obesity who presents to the emergency department via [facility] for suicidal ideation.  Patient was seen in the ED 6/15 for intentional ingestion of BuSpar and Cymbalta.  She reports that she has had suicidal ideation since that time with plan to overdose or slit her neck.  She denies recent ingestion.  States that "I need to get the demons out of my body."  Also states "I live alone and get bored a lot."  Feels lonely and scared at night when she is alone and by herself.  She denies visual or auditory hallucinations.  Denies homicidal ideation.  Denies recent drug or alcohol use.  She receives psychiatric care from [facility] in [place] and is currently taking BuSpar, Cymbalta, and Vega and Lamictal.  She denies any physical complaints.  Denies fevers, chills, cough, congestion, sore throat, chest pain, shortness of breath, abdominal pain, nausea/vomiting, diarrhea, dysuria, urinary frequency, lightheadedness or syncope.  Past Medical History:  Diagnosis Date  . Anxiety   . Bipolar 1 disorder ([place])   . Cognitive deficits   . Depression   . Diabetes mellitus without complication ([place])   . Hypertension   . Mental disorder   . Obesity     Patient Active Problem List   Diagnosis Date Noted  . Psychosis ([place])   . 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 LGSIL on Pap smear of cervix 02/01/2017  . OCD (obsessive compulsive disorder) 10/05/2016  . Major depressive disorder, recurrent episode, mild ([place]) 05/04/2016  . 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  Learning disability 07/18/2015  . 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 Diabetes mellitus ([place]) 07/18/2015  . 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 Hyperlipidemia 07/18/2015  . Severe episode of recurrent major depressive disorder, without psychotic features ([place])   . Drug overdose   . Cognitive deficits 10/12/2012  . Generalized anxiety disorder 06/28/2012    Past Surgical History:  Procedure Laterality Date  . CESAREAN SECTION    . CESAREAN SECTION N/A 04/25/2013   Procedure: REPEAT CESAREAN SECTION;  Surgeon: [name], MD;  Location: [facility];  Service: Obstetrics;  Laterality: N/A;  . MASS EXCISION N/A 06/03/2012   Procedure: EXCISION MASS;  Surgeon: [name], MD;  Location: [facility];  Service: ENT;  Laterality: N/A;  Excision uvula mass  . TONSILLECTOMY N/A 06/03/2012   Procedure: TONSILLECTOMY;  Surgeon: [name], MD;  Location: [facility];  Service: ENT;  Laterality: N/A;  . TONSILLECTOMY       OB History    Gravida  3   Para  3   Term  3   Preterm  0   AB  0   Living  3     SAB  0   TAB  0   Ectopic  0   Multiple  0   Live Births  3            Home Medications    Prior to Admission medications   Medication Sig Start Date End Date Taking? Authorizing  Provider  busPIRone (BUSPAR) 15 MG tablet Take 1 tablet (15 mg total) by mouth 2 (two) times daily. For anxiety Patient taking differently: Take 15 mg by mouth 3 (three) times daily. For anxiety 10/06/16  Yes Nwoko, Herbert Pun I, NP  DULoxetine (CYMBALTA) 20 MG capsule Take 20 mg by mouth 2 (two) times daily.   Yes [provider]  fluvoxaMINE (LUVOX) 50 MG tablet Take 1 tablet (50 mg total) by mouth at bedtime. 10/10/17  Yes Lord, Asa Saunas, NP  glipiZIDE (GLUCOTROL) 10 MG tablet Take 10 mg by mouth 2 (two) times daily before a meal.   Yes [provider]  hydrochlorothiazide (HYDRODIURIL) 25 MG  tablet Take 1 TABLET BY MOUTH EVERY DAY for 30 days 02/15/17  Yes [provider]  insulin detemir (LEVEMIR) 100 UNIT/ML injection Inject 40 Units into the skin 2 (two) times daily.   Yes [provider]  paliperidone (INVEGA SUSTENNA) 234 MG/1.5ML SUSP injection Inject 234 mg every 30 (thirty) days into the muscle.   Yes [provider]  paliperidone (INVEGA) 6 MG 24 hr tablet Take 1 tablet (6 mg total) by mouth daily. 10/10/17  Yes Patrecia Pour, NP    Family History Family History  Problem Relation Age of Onset  . Hypertension Mother   . Diabetes Father     Social History Social History   Tobacco Use  . Smoking status: Current Every Day Smoker    Packs/day: 3.00    Years: 11.00    Pack years: 33.00    Types: Cigarettes  . Smokeless tobacco: Never Used  Substance Use Topics  . Alcohol use: No  . Drug use: No    Comment: Patient denies     Allergies   Wellbutrin [bupropion]; Omnipaque [iohexol]; Penicillins; and Depakote er [divalproex sodium er]   Review of Systems Review of Systems  Constitutional: Negative for chills and fever.  HENT: Negative for congestion, ear pain and sore throat.   Respiratory: Negative for shortness of breath.   Cardiovascular: Negative for chest pain.  Gastrointestinal: Negative for abdominal pain, nausea and vomiting.  Genitourinary: Negative for difficulty urinating, dysuria and frequency.  Musculoskeletal: Negative for gait problem.  Neurological: Negative for syncope and light-headedness.  Psychiatric/Behavioral: Positive for agitation and suicidal ideas.     Physical Exam Updated Vital Signs BP 130/90 (BP Location: Left Arm)   Pulse (!) 102   Temp 98.2 F (36.8 C) (Oral)   Resp 15   Ht 5\' 7"  (1.702 m)   Wt 113.4 kg (250 lb)   SpO2 97%   BMI 39.16 kg/m   Physical Exam  Constitutional: She appears well-developed and well-nourished. No distress.  Nontoxic-appearing.  Appears agitated.  HENT:    Head: Normocephalic and atraumatic.  Mouth/Throat: Oropharynx is clear and moist.  Eyes: Pupils are equal, round, and reactive to light. Conjunctivae are normal. Right eye exhibits no discharge. Left eye exhibits no discharge.  Neck: Normal range of motion. Neck supple.  Cardiovascular: Normal rate and regular rhythm.  Pulmonary/Chest: Effort normal and breath sounds normal. No stridor. No respiratory distress. She has no wheezes. She has no rales.  Abdominal: Soft. Bowel sounds are normal. There is no tenderness.  Musculoskeletal: Normal range of motion.  Strength 5/5 in bilateral upper and lower extremities.  Neurological: She is alert. Coordination normal.  Gait normal and coordination and balance.  Skin: Skin is warm and dry. She is not diaphoretic.  Psychiatric:  Appears well kept. Voice loud and pressured. Good  articulation. Voices delusions that she has demons insider her body. No apparent hallucinations. Endorses suicidal ideation with plan to overdose or slit her neck.   Nursing note and vitals reviewed.    ED Treatments / Results  Labs (all labs ordered are listed, but only abnormal results are displayed) Labs Reviewed  COMPREHENSIVE METABOLIC PANEL - Abnormal; Notable for the following components:      Result Value   Chloride 100 (*)    Glucose, Bld 260 (*)    All other components within normal limits  ACETAMINOPHEN LEVEL - Abnormal; Notable for the following components:   Acetaminophen (Tylenol), Serum <10 (*)    All other components within normal limits  RAPID URINE DRUG SCREEN, HOSP PERFORMED - Abnormal; Notable for the following components:   Barbiturates   (*)    Value: Result not available. Reagent lot number recalled by manufacturer.   All other components within normal limits  CBG MONITORING, ED - Abnormal; Notable for the following components:   Glucose-Capillary 258 (*)    All other components within normal limits  ETHANOL  SALICYLATE LEVEL  CBC  I-STAT  BETA HCG BLOOD, ED ([facility], [facility], [facility] ONLY)    EKG None  Radiology No results found.  Procedures Procedures (including critical care time)  Medications Ordered in ED Medications  busPIRone (BUSPAR) tablet 15 mg (15 mg Oral Given 10/18/17 2208)  DULoxetine (CYMBALTA) DR capsule 20 mg (20 mg Oral Given 10/18/17 2208)  fluvoxaMINE (LUVOX) tablet 50 mg (50 mg Oral Given 10/18/17 2208)  glipiZIDE (GLUCOTROL) tablet 10 mg (has no administration in time range)  hydrochlorothiazide (HYDRODIURIL) tablet 25 mg (has no administration in time range)  insulin detemir (LEVEMIR) injection 40 Units (40 Units Subcutaneous Given 10/18/17 2209)  paliperidone (INVEGA) 24 hr tablet 6 mg (has no administration in time range)     Initial Impression / Assessment and Plan / ED Course  I have reviewed the triage vital signs and the nursing notes.  Pertinent labs & imaging results that were available during my care of the patient were reviewed by me and considered in my medical decision making (see chart for details).    Patient with a history of bipolar disorder and cognitive impairment presents to the emergency department with suicidal ideation and plan to overdose or slit her neck.  She has a history of frequent visits for this.  Denies any physical complaints.  Lab work reviewed.  CBC WNL.  CMP reveals elevated glucose, patient known diabetic.  No anion gap or concern for DKA.  Acetaminophen, salicylate and ethanol level negative.  Beta hCG negative.  Patient is medically cleared.  Placed in psych hold and awaiting TTS consult for further disposition.  Spoke with TTS counselor Curlene Dolphin who evaluated the patient and recommends overnight observation for safety.  Patient informed.  Final Clinical Impressions(s) / ED Diagnoses   Final diagnoses:  None    ED Discharge Orders    None       Bernarda Caffey 10/18/17 2308    Lacretia Leigh, MD 10/19/17 1753

## 2017-10-18 NOTE — BH Assessment (Addendum)
Assessment Note  [name] is an 27 y.o. female.  -Clinician reviewed note by Geanie Kenning, PA.  [name] is a 27 year old female with a history of bipolar disorder, cognitive impairment, type 2 diabetes, hypertension, obesity who presents to the emergency department via [employer] for suicidal ideation.  Patient was seen in the ED 6/15 for intentional ingestion of BuSpar and Cymbalta.  She reports that she has had suicidal ideation since that time with plan to overdose or slit her neck.  She denies recent ingestion.  States that "I need to get the demons out of my body."  Also states "I live alone and get bored a lot."  Feels lonely and scared at night when she is alone and by herself.  She denies visual or auditory hallucinations.  Denies homicidal ideation.  Denies recent drug or alcohol use.  Patient called police to let them know she was feeling like killing herself.  Pt is voluntary.  Patient says that she would overdose on her medication to kill herself.  She is scared living alone.  She says "I feel like someone is going to break in on me at night."  Patient says she called her ACTT team but "the woman hung up on me."  Pt cannot say which ACTT team is currently.  Patient says she attempted to kill herself by overdosing on 10/09/17.  Patient also says that she has conflict with her mother "because she don't want me around."  Also says her sisters are no supportive.  Patient denies any HI or A/V hallucinations.  She does talk about "wanting to get these demons out of me" and clinician asks if she is talking about her anxiety and she says "yes."  Patient complains about feeling compelled to shave her head and her eyebrows.    Patient denies any use of ETOH or illicit drugs.  Patient says that she has been to [facility] about 2.5 weeks ago.  She has had numerous inpatient psychiatric stays at hospitals.  -Clinician discussed patient care with Lindon Romp, [place].  He recommends patient be observed  overnight for safety and stability.  Psychiatry to review patient in AM.  Geanie Kenning, PA informed of disposition.  Diagnosis: F31.4 Bipolar 1 d/o, current or most recent episode depressed, severe  Past Medical History:  Past Medical History:  Diagnosis Date  . Anxiety   . Bipolar 1 disorder ([place])   . Cognitive deficits   . Depression   . Diabetes mellitus without complication ([place])   . Hypertension   . Mental disorder   . Obesity     Past Surgical History:  Procedure Laterality Date  . CESAREAN SECTION    . CESAREAN SECTION N/A 04/25/2013   Procedure: REPEAT CESAREAN SECTION;  Surgeon: [name], MD;  Location: [facility];  Service: Obstetrics;  Laterality: N/A;  . MASS EXCISION N/A 06/03/2012   Procedure: EXCISION MASS;  Surgeon: [name], MD;  Location: [facility];  Service: ENT;  Laterality: N/A;  Excision uvula mass  . TONSILLECTOMY N/A 06/03/2012   Procedure: TONSILLECTOMY;  Surgeon: [name], MD;  Location: [place];  Service: ENT;  Laterality: N/A;  . TONSILLECTOMY      Family History:  Family History  Problem Relation Age of Onset  . Hypertension Mother   . Diabetes Father     Social History:  reports that she has been smoking cigarettes.  She has a 33.00 pack-year smoking history. She has never used smokeless tobacco.  She reports that she does not drink alcohol or use drugs.  Additional Social History:  Alcohol / Drug Use Pain Medications: See PTA medication list Prescriptions: See PTA medication list.  Says she started on Lamictal about 1.5 months ago.  Stopped taking taking it this past Saturday (06/22). Over the Counter: None History of alcohol / drug use?: No history of alcohol / drug abuse(Pt denies.)  CIWA: CIWA-Ar BP: 130/90 Pulse Rate: (!) 102 COWS:    Allergies:  Allergies  Allergen Reactions  . Wellbutrin [Bupropion] Shortness Of Breath  . Omnipaque [Iohexol] Swelling and Other (See Comments)     Reaction:  Eye swelling  . Penicillins Hives and Other (See Comments)    Has patient had a PCN reaction causing immediate rash, facial/tongue/throat swelling, SOB or lightheadedness with hypotension: No Has patient had a PCN reaction causing severe rash involving mucus membranes or skin necrosis: No Has patient had a PCN reaction that required hospitalization No Has patient had a PCN reaction occurring within the last 10 years: No If all of the above answers are "NO", then may proceed with Cephalosporin use.  . Depakote Er [Divalproex Sodium Er] Nausea And Vomiting    Home Medications:  (Not in a hospital admission)  OB/GYN Status:  No LMP recorded. Patient has had an implant.  General Assessment Data Location of Assessment: [facility] TTS Assessment: In system Is this a Tele or Face-to-Face Assessment?: Face-to-Face Is this an Initial Assessment or a Re-assessment for this encounter?: Initial Assessment Marital status: Single Is patient pregnant?: No Pregnancy Status: No Living Arrangements: Alone Can pt return to current living arrangement?: Yes Admission Status: Voluntary Is patient capable of signing voluntary admission?: Yes Referral Source: Self/Family/Friend(Pt called police for help.) Insurance type: MCD     Crisis Care Plan Living Arrangements: Alone Name of Psychiatrist: [facility](Last visit was 2.5 weeks ago.) Name of Therapist: [facility](Pt is unsure of who her ACTT team is.  Claims they hung up )  Education Status Is patient currently in school?: No Highest grade of school patient has completed: 11 Is the patient employed, unemployed or receiving disability?: Receiving disability income  Risk to self with the past 6 months Suicidal Ideation: Yes-Currently Present Has patient been a risk to self within the past 6 months prior to admission? : Yes Suicidal Intent: No-Not Currently/Within Last 6 Months Has patient had any suicidal intent within the past 6 months prior to  admission? : Yes Is patient at risk for suicide?: Yes Suicidal Plan?: Yes-Currently Present Has patient had any suicidal plan within the past 6 months prior to admission? : Yes Specify Current Suicidal Plan: Overdose on meds. Access to Means: Yes Specify Access to Suicidal Means: Has medications. What has been your use of drugs/alcohol within the last 12 months?: Pt denies Previous Attempts/Gestures: Yes How many times?: (multiple) Other Self Harm Risks: None Triggers for Past Attempts: Unpredictable Intentional Self Injurious Behavior: None Comment - Self Injurious Behavior: Pt denies Family Suicide History: No Recent stressful life event(s): Other (Comment), Financial Problems(Conflict w/ mother.  Living alone.) Persecutory voices/beliefs?: Yes Depression: Yes Depression Symptoms: Despondent, Loss of interest in usual pleasures, Feeling worthless/self pity, Insomnia, Isolating Substance abuse history and/or treatment for substance abuse?: No Suicide prevention information given to non-admitted patients: Not applicable  Risk to Others within the past 6 months Homicidal Ideation: No Does patient have any lifetime risk of violence toward others beyond the six months prior to admission? : No Thoughts of Harm to Others: No Current  Homicidal Intent: No Current Homicidal Plan: No Access to Homicidal Means: No Identified Victim: No one History of harm to others?: No Assessment of Violence: None Noted Violent Behavior Description: None reported Does patient have access to weapons?: No Criminal Charges Pending?: No Does patient have a court date: No Is patient on probation?: No  Psychosis Hallucinations: None noted Delusions: None noted  Mental Status Report Appearance/Hygiene: In scrubs(Shaved head and eyebrows.) Eye Contact: Good Motor Activity: Freedom of movement, Unremarkable Speech: Logical/coherent, Loud Level of Consciousness: Alert Mood: Depressed, Anxious,  Sad Affect: Anxious, Sad Anxiety Level: Moderate Thought Processes: Coherent, Relevant Judgement: Unimpaired Orientation: Person, Place, Time, Situation Obsessive Compulsive Thoughts/Behaviors: Moderate(Shaves head and eyebrows)  Cognitive Functioning Concentration: Fair Memory: Recent Intact, Remote Intact Is patient IDD: No Is patient DD?: No I IQ score available?: No Insight: Fair Impulse Control: Poor Appetite: Good Have you had any weight changes? : No Change Amount of the weight change? (lbs): (N/A) Sleep: Decreased Total Hours of Sleep: (<4H/D) Vegetative Symptoms: None  ADLScreening [facility] Assessment Services) Patient's cognitive ability adequate to safely complete daily activities?: Yes Patient able to express need for assistance with ADLs?: Yes Independently performs ADLs?: Yes (appropriate for developmental age)  Prior Inpatient Therapy Prior Inpatient Therapy: Yes Prior Therapy Dates: MULTIPLE Prior Therapy Facilty/Provider(s): MULTIPLE: [facility], [facility], [facility], [facility] Reason for Treatment: SI, MDD  Prior Outpatient Therapy Prior Outpatient Therapy: Yes Prior Therapy Dates: CURRENT Prior Therapy Facilty/Provider(s): [facility] OR [facility] Reason for Treatment: MED MGMT & COUNSELING Does patient have an ACCT team?: Unknown(Pt mentioned [facility] and [facility].) Does patient have Intensive In-House Services?  : No Does patient have [facility] services? : Yes Does patient have [facility] services?: No  ADL Screening (condition at time of admission) Patient's cognitive ability adequate to safely complete daily activities?: Yes Is the patient deaf or have difficulty hearing?: No Does the patient have difficulty seeing, even when wearing glasses/contacts?: No Does the patient have difficulty concentrating, remembering, or making decisions?: Yes Patient able to express need for assistance with ADLs?: Yes Does the patient have difficulty dressing or bathing?: No Independently performs ADLs?:  Yes (appropriate for developmental age) Does the patient have difficulty walking or climbing stairs?: No Weakness of Legs: None Weakness of Arms/Hands: None       Abuse/Neglect Assessment (Assessment to be complete while patient is alone) Physical Abuse: Denies Verbal Abuse: Yes, present (Comment) Sexual Abuse: Denies Exploitation of patient/patient's resources: Denies Self-Neglect: Denies     Regulatory affairs officer (For Healthcare) Does Patient Have a Medical Advance Directive?: No Would patient like information on creating a medical advance directive?: No - Patient declined          Disposition:  Disposition Initial Assessment Completed for this Encounter: Yes Patient referred to: Other (Comment)(To be reviewed by FNP)  On Site Evaluation by:   Reviewed with Physician:    Raymondo Band 10/18/2017 9:45 PM

## 2017-10-18 NOTE — ED Triage Notes (Signed)
Patient called 911 stating she was experiencing suicidal thoughts. Patient states "once I get out of this body, there is witch craft and demons in me". Patient is calm and cooperative but will intermittently yell out about suicide comments and about wanting to get out of this body.

## 2017-10-18 NOTE — ED Notes (Signed)
SBAR Report received from previous nurse. Pt received calm and visible on unit. Pt endorses current SI/ HI,  Depression, and anxiety at this time,but appears otherwise stable and free of distress pt denies A / VH. Pt reminded of camera surveillance, q 15 min rounds, and rules of the milieu. Pt gave short answers to assessment questions and went to bed. Will continue to assess.

## 2017-10-18 NOTE — ED Notes (Signed)
Pt came out of triage room and sat on floor [employer] and writer encouraged pt to get up and go back in room Pt was able to be redirected Pt calm and cooperative with [employer]

## 2017-10-19 DIAGNOSIS — R5381 Other malaise: Secondary | ICD-10-CM

## 2017-10-19 DIAGNOSIS — R450 Nervousness: Secondary | ICD-10-CM

## 2017-10-19 DIAGNOSIS — F330 Major depressive disorder, recurrent, mild: Secondary | ICD-10-CM

## 2017-10-19 LAB — CBG MONITORING, ED: GLUCOSE-CAPILLARY: 256 mg/dL — AB (ref 70–99)

## 2017-10-19 MED ORDER — PALIPERIDONE ER 3 MG PO TB24: 3.0000 mg | ORAL_TABLET | Freq: Every day | ORAL | Status: DC

## 2017-10-19 MED ORDER — PALIPERIDONE ER 3 MG PO TB24: 3.0000 mg | ORAL_TABLET | Freq: Every day | ORAL | 0 refills | Status: DC

## 2017-10-19 NOTE — Discharge Instructions (Signed)
For your behavioral health needs, you are advised to continue treatment with the [facility] ACT Team:       [facility] ACT Team      [facility] [employer], [address]      [address]      [place], [place]  [address]      [phone]      Crisis number: [id number]

## 2017-10-19 NOTE — BH Assessment (Signed)
[facility] Assessment Progress Note  Per Buford Dresser, DO, this pt does not require psychiatric hospitalization at this time.  Pt is to be discharged from [facility] with recommendation to continue treatment with [facility].  This has been included in pt's discharge instructions.  At 10:22 this Probation officer called [facility] and spoke to [place].  She reports that pt's service was recently upgraded from [employer] to ACT Team.  I informed her that pt will be discharged from [facility] by 15:00, and asked them to come to pick her up.  Pt's nurse, Caryl Pina, has been notified.  Jalene Mullet, [place] Triage Specialist [phone]

## 2017-10-19 NOTE — Consult Note (Addendum)
[facility] Consult   Reason for Consult:  Suicidal ideations Referring Physician:  EDP Patient Identification: [name] MRN:  [id number] Principal Diagnosis: Major depressive disorder, recurrent episode, mild ([place]) Diagnosis:   Patient Active Problem List   Diagnosis Date Noted  . Major depressive disorder, recurrent episode, mild ([place]) [F33.0] 05/04/2016    Priority: High  . Generalized anxiety disorder [F41.1] 06/28/2012    Priority: High  . Psychosis ([place]) [F29]   . Adjustment disorder with mixed disturbance of emotions and conduct [F43.25] 08/03/2017  . LGSIL on Pap smear of cervix [R87.612] 02/01/2017  . OCD (obsessive compulsive disorder) [F42.9] 10/05/2016  . 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 Learning disability [F81.9] 07/18/2015  . 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 Diabetes mellitus ([place]) [E11.9] 07/18/2015  . MDD (major depressive disorder), recurrent, severe, with psychosis ([place]) [F33.3] 07/18/2015  . Hyperlipidemia [E78.5] 07/18/2015  . Severe episode of recurrent major depressive disorder, without psychotic features ([place]) [F33.2]   . Drug overdose [T50.901A]   . Cognitive deficits [R41.89] 10/12/2012    Total Time spent with patient: 45 minutes  Subjective:   [name] is a 27 y.o. female patient admitted does not warrant admission.  HPI:  27 yo female who called 911 last night after suicidal ideations began.  These suicidal ideations often come on suddenly for her and can last a few minutes to several hours to sometimes, days.  She has overdosed in the past and this is her typical plan when she does have suicidal ideations. She did not call her [facility] ACT team but came to the ED.  She also had some delusions on admission but not at this time.  Today, on assessment she denies suicidal/homicdal ideations, hallucinations, or substance abuse.  She does report feeling tired and sleepy after she started Invega 6  mg, feels it is too much.  Decreased it to 3 mg to alleviate this issue.  Some anxiety at times 1-2/10 at this time, usually when she is alone at home and bored.  3/10 depression which is a constant for her.  [facility] contacted, stable for discharge.  Past Psychiatric History:   Depression, OCD  Risk to Self: None Risk to Others: Homicidal Ideation: No Thoughts of Harm to Others: No Current Homicidal Intent: No Current Homicidal Plan: No Access to Homicidal Means: No Identified Victim: No one History of harm to others?: No Assessment of Violence: None Noted Violent Behavior Description: None reported Does patient have access to weapons?: No Criminal Charges Pending?: No Does patient have a court date: No Prior Inpatient Therapy: Prior Inpatient Therapy: Yes Prior Therapy Dates: MULTIPLE Prior Therapy Facilty/Provider(s): MULTIPLE: [facility], [facility], [facility], [facility] Reason for Treatment: SI, MDD Prior Outpatient Therapy: Prior Outpatient Therapy: Yes Prior Therapy Dates: CURRENT Prior Therapy Facilty/Provider(s): [facility] Reason for Treatment: MED MGMT & COUNSELING Does patient have an ACCT team?: Unknown(Pt mentioned [facility] and [facility].) Does patient have Intensive In-House Services?  : No Does patient have [facility] services? : Yes Does patient have [facility] services?: No  Past Medical History:  Past Medical History:  Diagnosis Date  . Anxiety   . Bipolar 1 disorder ([place])   . Cognitive deficits   . Depression   . Diabetes mellitus without complication ([place])   . Hypertension   . Mental disorder   . Obesity     Past Surgical History:  Procedure Laterality Date  . CESAREAN SECTION    . CESAREAN SECTION N/A 04/25/2013  Procedure: REPEAT CESAREAN SECTION;  Surgeon: [name], MD;  Location: [facility];  Service: Obstetrics;  Laterality: N/A;  . MASS EXCISION N/A 06/03/2012   Procedure: EXCISION MASS;  Surgeon: [name], MD;  Location: [facility];  Service: ENT;   Laterality: N/A;  Excision uvula mass  . TONSILLECTOMY N/A 06/03/2012   Procedure: TONSILLECTOMY;  Surgeon: [name], MD;  Location: [facility];  Service: ENT;  Laterality: N/A;  . TONSILLECTOMY     Family History:  Family History  Problem Relation Age of Onset  . Hypertension Mother   . Diabetes Father    Family Psychiatric  History: none Social History:  Social History   Substance and Sexual Activity  Alcohol Use No     Social History   Substance and Sexual Activity  Drug Use No   Comment: Patient denies    Social History   Socioeconomic History  . Marital status: Single    Spouse name: Not on file  . Number of children: Not on file  . Years of education: Not on file  . Highest education level: Not on file  Occupational History  . Not on file  Social Needs  . Financial resource strain: Not on file  . Food insecurity:    Worry: Not on file    Inability: Not on file  . Transportation needs:    Medical: Not on file    Non-medical: Not on file  Tobacco Use  . Smoking status: Current Every Day Smoker    Packs/day: 3.00    Years: 11.00    Pack years: 33.00    Types: Cigarettes  . Smokeless tobacco: Never Used  Substance and Sexual Activity  . Alcohol use: No  . Drug use: No    Comment: Patient denies  . Sexual activity: Yes    Birth control/protection: Implant  Lifestyle  . Physical activity:    Days per week: Not on file    Minutes per session: Not on file  . Stress: Not on file  Relationships  . Social connections:    Talks on phone: Not on file    Gets together: Not on file    Attends religious service: Not on file    Active member of club or organization: Not on file    Attends meetings of clubs or organizations: Not on file    Relationship status: Not on file  Other Topics Concern  . Not on file  Social History Narrative  . Not on file   Additional Social History: N/A    Allergies:   Allergies  Allergen Reactions  .  Wellbutrin [Bupropion] Shortness Of Breath  . Omnipaque [Iohexol] Swelling and Other (See Comments)    Reaction:  Eye swelling  . Penicillins Hives and Other (See Comments)    Has patient had a PCN reaction causing immediate rash, facial/tongue/throat swelling, SOB or lightheadedness with hypotension: No Has patient had a PCN reaction causing severe rash involving mucus membranes or skin necrosis: No Has patient had a PCN reaction that required hospitalization No Has patient had a PCN reaction occurring within the last 10 years: No If all of the above answers are "NO", then may proceed with Cephalosporin use.  . Depakote Er [Divalproex Sodium Er] Nausea And Vomiting    Labs:  Results for orders placed or performed during the hospital encounter of 10/18/17 (from the past 48 hour(s))  CBG monitoring, ED     Status: Abnormal   Collection Time: 10/18/17  7:58 PM  Result Value Ref Range   Glucose-Capillary 258 (H) 65 - 99 mg/dL   Comment 1 Notify RN   Comprehensive metabolic panel     Status: Abnormal   Collection Time: 10/18/17  8:07 PM  Result Value Ref Range   Sodium 139 135 - 145 mmol/L   Potassium 3.7 3.5 - 5.1 mmol/L   Chloride 100 (L) 101 - 111 mmol/L   CO2 28 22 - 32 mmol/L   Glucose, Bld 260 (H) 65 - 99 mg/dL   BUN 13 6 - 20 mg/dL   Creatinine, Ser 0.80 0.44 - 1.00 mg/dL   Calcium 9.6 8.9 - 10.3 mg/dL   Total Protein 7.4 6.5 - 8.1 g/dL   Albumin 4.1 3.5 - 5.0 g/dL   AST 35 15 - 41 U/L   ALT 29 14 - 54 U/L   Alkaline Phosphatase 50 38 - 126 U/L   Total Bilirubin 0.6 0.3 - 1.2 mg/dL   GFR calc non Af Amer >60 >60 mL/min   GFR calc Af Amer >60 >60 mL/min    Comment: (NOTE) The eGFR has been calculated using the CKD EPI equation. This calculation has not been validated in all clinical situations. eGFR's persistently <60 mL/min signify possible Chronic Kidney Disease.    Anion gap 11 5 - 15    Comment: Performed at [facility], [place] [address]  [place], [place] [address]  Ethanol     Status: None   Collection Time: 10/18/17  8:07 PM  Result Value Ref Range   Alcohol, Ethyl (B) <10 <10 mg/dL    Comment: (NOTE) Lowest detectable limit for serum alcohol is 10 mg/dL. For medical purposes only. Performed at [facility], [place] [address], [place], [place] [address]   Salicylate level     Status: None   Collection Time: 10/18/17  8:07 PM  Result Value Ref Range   Salicylate Lvl <6.8 2.8 - 30.0 mg/dL    Comment: Performed at [facility], [place] [address], [place], [place] [address]  Acetaminophen level     Status: Abnormal   Collection Time: 10/18/17  8:07 PM  Result Value Ref Range   Acetaminophen (Tylenol), Serum <10 (L) 10 - 30 ug/mL    Comment: (NOTE) Therapeutic concentrations vary significantly. A range of 10-30 ug/mL  may be an effective concentration for many patients. However, some  are best treated at concentrations outside of this range. Acetaminophen concentrations >150 ug/mL at 4 hours after ingestion  and >50 ug/mL at 12 hours after ingestion are often associated with  toxic reactions. Performed at [facility], [place] [address] [place], [place] [address]   cbc     Status: None   Collection Time: 10/18/17  8:07 PM  Result Value Ref Range   WBC 9.0 4.0 - 10.5 K/uL   RBC 4.95 3.87 - 5.11 MIL/uL   Hemoglobin 13.9 12.0 - 15.0 g/dL   HCT 41.0 36.0 - 46.0 %   MCV 82.8 78.0 - 100.0 fL   MCH 28.1 26.0 - 34.0 pg   MCHC 33.9 30.0 - 36.0 g/dL   RDW 13.9 11.5 - 15.5 %   Platelets 231 150 - 400 K/uL    Comment: Performed at [facility], [place] [address], [place],  [address]  Rapid urine drug screen (hospital performed)     Status: Abnormal   Collection Time: 10/18/17  8:07 PM  Result Value Ref Range   Opiates  NONE DETECTED NONE DETECTED   Cocaine NONE DETECTED NONE DETECTED   Benzodiazepines NONE DETECTED NONE DETECTED   Amphetamines NONE  DETECTED NONE DETECTED   Tetrahydrocannabinol NONE DETECTED NONE DETECTED   Barbiturates (A) NONE DETECTED    Result not available. Reagent lot number recalled by manufacturer.    Comment: Performed at [facility], [place] [address] [place], [place] [address]  I-Stat beta hCG blood, ED     Status: None   Collection Time: 10/18/17  8:14 PM  Result Value Ref Range   I-stat hCG, quantitative <5.0 <5 mIU/mL   Comment 3            Comment:   GEST. AGE      CONC.  (mIU/mL)   <=1 WEEK        5 - 50     2 WEEKS       50 - 500     3 WEEKS       100 - 10,000     4 WEEKS     1,000 - 30,000        FEMALE AND NON-PREGNANT FEMALE:     LESS THAN 5 mIU/mL   CBG monitoring, ED     Status: Abnormal   Collection Time: 10/19/17  9:39 AM  Result Value Ref Range   Glucose-Capillary 256 (H) 70 - 99 mg/dL    Current Facility-Administered Medications  Medication Dose Route Frequency Provider Last Rate Last Dose  . busPIRone (BUSPAR) tablet 15 mg  15 mg Oral TID Glyn Ade, PA-C   15 mg at 10/19/17 0947  . DULoxetine (CYMBALTA) DR capsule 20 mg  20 mg Oral BID Glyn Ade, PA-C   20 mg at 10/19/17 0947  . fluvoxaMINE (LUVOX) tablet 50 mg  50 mg Oral QHS Glyn Ade, PA-C   50 mg at 10/18/17 2208  . glipiZIDE (GLUCOTROL) tablet 10 mg  10 mg Oral BID AC Glyn Ade, PA-C   10 mg at 10/19/17 1478  . hydrochlorothiazide (HYDRODIURIL) tablet 25 mg  25 mg Oral Daily Shrosbree, Emily J, PA-C      . insulin detemir (LEVEMIR) injection 40 Units  40 Units Subcutaneous BID Glyn Ade, [place]   40 Units at 10/19/17 2956  . [START ON 10/20/2017] paliperidone (INVEGA) 24 hr tablet 3 mg  3 mg Oral Daily Patrecia Pour, NP       Current Outpatient Medications  Medication Sig Dispense Refill  . busPIRone (BUSPAR) 15 MG tablet Take 1 tablet (15 mg total) by mouth 2 (two) times daily. For anxiety (Patient taking differently: Take 15 mg by mouth 3 (three) times daily. For  anxiety) 60 tablet 0  . DULoxetine (CYMBALTA) 20 MG capsule Take 20 mg by mouth 2 (two) times daily.    . fluvoxaMINE (LUVOX) 50 MG tablet Take 1 tablet (50 mg total) by mouth at bedtime. 30 tablet 0  . glipiZIDE (GLUCOTROL) 10 MG tablet Take 10 mg by mouth 2 (two) times daily before a meal.    . hydrochlorothiazide (HYDRODIURIL) 25 MG tablet Take 1 TABLET BY MOUTH EVERY DAY for 30 days  1  . insulin detemir (LEVEMIR) 100 UNIT/ML injection Inject 40 Units into the skin 2 (two) times daily.    . paliperidone (INVEGA SUSTENNA) 234 MG/1.5ML SUSP injection Inject 234 mg every 30 (thirty) days into the muscle.    . paliperidone (INVEGA) 6 MG 24 hr tablet Take 1 tablet (6 mg total)  by mouth daily. 30 tablet 0    Musculoskeletal: Strength & Muscle Tone: within normal limits Gait & Station: normal Patient leans: N/A  Psychiatric Specialty Exam: Physical Exam  Nursing note and vitals reviewed. Constitutional: She is oriented to person, place, and time. She appears well-developed and well-nourished.  HENT:  Head: Normocephalic.  Neck: Normal range of motion.  Respiratory: Effort normal.  Musculoskeletal: Normal range of motion.  Neurological: She is alert and oriented to person, place, and time.  Psychiatric: Her speech is normal and behavior is normal. Judgment and thought content normal. Her mood appears anxious. Her affect is blunt. Cognition and memory are normal. She exhibits a depressed mood.    Review of Systems  Constitutional: Positive for malaise/fatigue.  Psychiatric/Behavioral: Positive for depression. The patient is nervous/anxious.   All other systems reviewed and are negative.   Blood pressure 130/90, pulse (!) 102, temperature 98.2 F (36.8 C), temperature source Oral, resp. rate 15, height _0  (1.702 m), weight 113.4 kg (250 lb), SpO2 97 %.Body mass index is 39.16 kg/m.  General Appearance: Casual  Eye Contact:  Good  Speech:  Normal Rate  Volume:  Normal  Mood:   Anxious and Depressed  Affect:  Blunt  Thought Process:  Coherent and Descriptions of Associations: Intact  Orientation:  Full (Time, Place, and Person)  Thought Content:  WDL and Logical  Suicidal Thoughts:  No  Homicidal Thoughts:  No  Memory:  Immediate;   Good Recent;   Good Remote;   Good  Judgement:  Fair  Insight:  Fair  Psychomotor Activity:  Decreased  Concentration:  Concentration: Good and Attention Span: Good  Recall:  Good  Fund of Knowledge:  Fair  Language:  Good  Akathisia:  No  Handed:  Right  AIMS (if indicated):   N/A  Assets:  Housing Leisure Time Resilience Social Support  ADL's:  Intact  Cognition:  Impaired,  Mild  Sleep:   N/A     Treatment Plan Summary: Major depressive disorder, recurrent, mild: -Decreased Invega 6 mg daily to 3 mg daily for mood stabilization -Continued Luvox 50 mg at bedtime for depression and anxiety -Continued Cymbalta 20 mg BID for depression  Anxiety: -Continued Buspar 15 mg TID for anxiety  Disposition: No evidence of imminent risk to self or others at present.    Waylan Boga, NP 10/19/2017 1:31 PM   Patient seen face-to-face for psychiatric evaluation, chart reviewed and case discussed with the physician extender and developed treatment plan. Reviewed the information documented and agree with the treatment plan.  Buford Dresser, DO 10/19/17 4:47 PM

## 2017-10-19 NOTE — [facility] Suicide Risk Assessment (Signed)
Suicide Risk Assessment  Discharge Assessment   [facility] Discharge Suicide Risk Assessment   Principal Problem: Major depressive disorder, recurrent episode, mild ([place]) Discharge Diagnoses:  Patient Active Problem List   Diagnosis Date Noted  . Major depressive disorder, recurrent episode, mild ([place]) [F33.0] 05/04/2016    Priority: High  . Generalized anxiety disorder [F41.1] 06/28/2012    Priority: High  . Psychosis ([place]) [F29]   . Adjustment disorder with mixed disturbance of emotions and conduct [F43.25] 08/03/2017  . LGSIL on Pap smear of cervix [R87.612] 02/01/2017  . OCD (obsessive compulsive disorder) [F42.9] 10/05/2016  . 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 Learning disability [F81.9] 07/18/2015  . 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 Diabetes mellitus ([place]) [E11.9] 07/18/2015  . MDD (major depressive disorder), recurrent, severe, with psychosis ([place]) [F33.3] 07/18/2015  . Hyperlipidemia [E78.5] 07/18/2015  . Severe episode of recurrent major depressive disorder, without psychotic features ([place]) [F33.2]   . Drug overdose [T50.901A]   . Cognitive deficits [R41.89] 10/12/2012    Total Time spent with patient: 45 minutes  Musculoskeletal: Strength & Muscle Tone: within normal limits Gait & Station: normal Patient leans: N/A  Psychiatric Specialty Exam: Physical Exam  Nursing note and vitals reviewed. Constitutional: She is oriented to person, place, and time. She appears well-developed and well-nourished.  HENT:  Head: Normocephalic.  Neck: Normal range of motion.  Respiratory: Effort normal.  Musculoskeletal: Normal range of motion.  Neurological: She is alert and oriented to person, place, and time.  Psychiatric: Her speech is normal and behavior is normal. Judgment and thought content normal. Her mood appears anxious. Her affect is blunt. Cognition and memory are normal. She exhibits a depressed mood.    Review of Systems   Constitutional: Positive for malaise/fatigue.  Psychiatric/Behavioral: Positive for depression. The patient is nervous/anxious.     Blood pressure 130/90, pulse (!) 102, temperature 98.2 F (36.8 C), temperature source Oral, resp. rate 15, height 5\' 7"  (1.702 m), weight 113.4 kg (250 lb), SpO2 97 %.Body mass index is 39.16 kg/m.  General Appearance: Casual  Eye Contact:  Good  Speech:  Normal Rate  Volume:  Normal  Mood:  Anxious and Depressed  Affect:  Blunt  Thought Process:  Coherent and Descriptions of Associations: Intact  Orientation:  Full (Time, Place, and Person)  Thought Content:  WDL and Logical  Suicidal Thoughts:  No  Homicidal Thoughts:  No  Memory:  Immediate;   Good Recent;   Good Remote;   Good  Judgement:  Fair  Insight:  Fair  Psychomotor Activity:  Decreased  Concentration:  Concentration: Good and Attention Span: Good  Recall:  Good  Fund of Knowledge:  Fair  Language:  Good  Akathisia:  No  Handed:  Right  AIMS (if indicated):     Assets:  Housing Leisure Time Physical Health Resilience Social Support  ADL's:  Intact  Cognition:  Impaired,  Mild  Sleep:       Mental Status Per Nursing Assessment::   On Admission:   suicidal ideations  Demographic Factors:  Adolescent or young adult and Living alone  Loss Factors: NA  Historical Factors: NA  Risk Reduction Factors:   Sense of responsibility to family, Positive social support and Positive therapeutic relationship  Continued Clinical Symptoms:  Depression and anxiety, mild  Cognitive Features That Contribute To Risk:  None    Suicide Risk:  Minimal: No identifiable suicidal ideation.  Patients presenting with no risk factors but with morbid ruminations; may  be classified as minimal risk based on the severity of the depressive symptoms    Plan Of Care/Follow-up recommendations:  Activity:  as tolerated Diet:  heart healthy diet  Akima Slaugh, NP 10/19/2017, 1:41 PM

## 2017-10-19 NOTE — ED Notes (Signed)
This nurse notified EDP [facility] of pt CBG. No new orders provided.

## 2017-10-19 NOTE — ED Notes (Signed)
Pt d/c home per MD order. Bus pass provided per pt request. Discharge summary reviewed with pt. Pt verbalizes understanding. rx provided. Pt denies SI/HI/AVH. Personal property returned to pt. Pt signed e-signature. Ambulatory off unit with [facility].

## 2017-10-25 ENCOUNTER — Other Ambulatory Visit: Payer: Self-pay

## 2017-10-25 ENCOUNTER — Emergency Department (HOSPITAL_COMMUNITY)
Admission: EM | Admit: 2017-10-25 | Discharge: 2017-10-26 | Disposition: A | Discharge status: Home / Self Care | Payer: Medicaid Other | Attending: Emergency Medicine | Admitting: Emergency Medicine

## 2017-10-25 DIAGNOSIS — Z794 Long term (current) use of insulin: Secondary | ICD-10-CM | POA: Diagnosis not present

## 2017-10-25 DIAGNOSIS — Z79899 Other long term (current) drug therapy: Secondary | ICD-10-CM | POA: Diagnosis not present

## 2017-10-25 DIAGNOSIS — F17210 Nicotine dependence, cigarettes, uncomplicated: Secondary | ICD-10-CM | POA: Diagnosis not present

## 2017-10-25 DIAGNOSIS — E119 Type 2 diabetes mellitus without complications: Secondary | ICD-10-CM | POA: Insufficient documentation

## 2017-10-25 DIAGNOSIS — F330 Major depressive disorder, recurrent, mild: Secondary | ICD-10-CM | POA: Diagnosis not present

## 2017-10-25 DIAGNOSIS — F329 Major depressive disorder, single episode, unspecified: Secondary | ICD-10-CM | POA: Diagnosis present

## 2017-10-25 DIAGNOSIS — R45851 Suicidal ideations: Secondary | ICD-10-CM | POA: Insufficient documentation

## 2017-10-25 DIAGNOSIS — F332 Major depressive disorder, recurrent severe without psychotic features: Secondary | ICD-10-CM | POA: Insufficient documentation

## 2017-10-25 DIAGNOSIS — R45850 Homicidal ideations: Secondary | ICD-10-CM | POA: Insufficient documentation

## 2017-10-25 LAB — COMPREHENSIVE METABOLIC PANEL
ALBUMIN: 4.2 g/dL (ref 3.5–5.0)
ALT: 27 U/L (ref 0–44)
ANION GAP: 8 (ref 5–15)
AST: 33 U/L (ref 15–41)
Alkaline Phosphatase: 46 U/L (ref 38–126)
BUN: 7 mg/dL (ref 6–20)
CHLORIDE: 103 mmol/L (ref 98–111)
CO2: 28 mmol/L (ref 22–32)
Calcium: 9.5 mg/dL (ref 8.9–10.3)
Creatinine, Ser: 0.88 mg/dL (ref 0.44–1.00)
GFR calc Af Amer: >60 mL/min (ref >60)
Glucose, Bld: 185 mg/dL — ABNORMAL HIGH (ref 70–99)
POTASSIUM: 3.3 mmol/L — AB (ref 3.5–5.1)
Sodium: 139 mmol/L (ref 135–145)
TOTAL PROTEIN: 7.5 g/dL (ref 6.5–8.1)
Total Bilirubin: 0.5 mg/dL (ref 0.3–1.2)

## 2017-10-25 LAB — ETHANOL

## 2017-10-25 LAB — RAPID URINE DRUG SCREEN, HOSP PERFORMED
AMPHETAMINES: NOT DETECTED
BENZODIAZEPINES: NOT DETECTED
COCAINE: NOT DETECTED
OPIATES: NOT DETECTED
Tetrahydrocannabinol: NOT DETECTED

## 2017-10-25 LAB — CBC
HCT: 42 % (ref 36.0–46.0)
Hemoglobin: 13.5 g/dL (ref 12.0–15.0)
MCH: 26.6 pg (ref 26.0–34.0)
MCHC: 32.1 g/dL (ref 30.0–36.0)
MCV: 82.7 fL (ref 78.0–100.0)
Platelets: 270 10*3/uL (ref 150–400)
RBC: 5.08 MIL/uL (ref 3.87–5.11)
RDW: 13.7 % (ref 11.5–15.5)
WBC: 11.1 10*3/uL — AB (ref 4.0–10.5)

## 2017-10-25 LAB — ACETAMINOPHEN LEVEL

## 2017-10-25 LAB — SALICYLATE LEVEL: Salicylate Lvl: <7 mg/dL (ref 2.8–30.0)

## 2017-10-25 LAB — I-STAT BETA HCG BLOOD, ED (MC, WL, AP ONLY): I-stat hCG, quantitative: <5 m[IU]/mL (ref <5)

## 2017-10-25 MED ORDER — FLUVOXAMINE MALEATE 50 MG PO TABS
100.0000 mg | ORAL_TABLET | Freq: Two times a day (BID) | ORAL | Status: DC
  Administered 2017-10-26: 100 mg via ORAL
  Filled 2017-10-25 (×2): qty 2

## 2017-10-25 MED ORDER — INSULIN DETEMIR 100 UNIT/ML ~~LOC~~ SOLN
40.0000 [IU] | Freq: Two times a day (BID) | SUBCUTANEOUS | Status: DC
  Administered 2017-10-26: 40 [IU] via SUBCUTANEOUS
  Filled 2017-10-25 (×3): qty 0.4

## 2017-10-25 MED ORDER — HYDROCHLOROTHIAZIDE 25 MG PO TABS
25.0000 mg | ORAL_TABLET | Freq: Every day | ORAL | Status: DC
  Administered 2017-10-26: 25 mg via ORAL
  Filled 2017-10-25: qty 1

## 2017-10-25 MED ORDER — GLIPIZIDE 10 MG PO TABS
10.0000 mg | ORAL_TABLET | Freq: Two times a day (BID) | ORAL | Status: DC
  Administered 2017-10-26: 10 mg via ORAL
  Filled 2017-10-25 (×2): qty 1

## 2017-10-25 MED ORDER — INVEGA SUSTENNA 234 MG/1.5ML IM SUSP: 234.0000 mg | INTRAMUSCULAR | Status: DC

## 2017-10-25 MED ORDER — LORAZEPAM 1 MG PO TABS
1.0000 mg | ORAL_TABLET | Freq: Once | ORAL | Status: AC
  Administered 2017-10-25: 1 mg via ORAL
  Filled 2017-10-25: qty 1

## 2017-10-25 MED ORDER — POTASSIUM CHLORIDE CRYS ER 20 MEQ PO TBCR
40.0000 meq | EXTENDED_RELEASE_TABLET | Freq: Once | ORAL | Status: AC
  Administered 2017-10-25: 40 meq via ORAL
  Filled 2017-10-25: qty 2

## 2017-10-25 NOTE — BH Assessment (Addendum)
Tele Assessment Note   Patient Name: [name] MRN: [id number] Referring Physician: Rodell Perna, PA-C  Location of Patient:  [facility] Location of Provider: [place] is an 27 y.o. female .who presents voluntarily to [place] police reporting symptoms of depression and suicidal ideation. Pt has a history of suicidal ideation and depression.  Pt has an extensive mental health history and has presented to emergency departments 28 times in the past 6 months.  Pt reports being compliant on her medications, however she reports they aren't working for her.  Pt reports current suicidal ideation with plans of overdosing on her medications. Pt reports 5 or 6 past attempts. Pt acknowledges symptoms including: sadness, fatigue, guilt, low self esteem, lack of motivation, staying in bed more, irritability, negative outlook, helplessness, sleeping more and eating more.  Pt denies homicidal ideation/ history of violence. Pt denies auditory or visual hallucinations or other psychotic symptoms. Pt states current stressors includes being under a lot of stress, her medications not working, feeling like killing herself, not liking her apartment or neighborhood.   Pt lives alone and denies having any supports.   Pt denies hstory of abuse and trauma . Pt denies family history of SI/MH/SA. Pt is unemployed and receiving disability. Pt has poor insight and poor judgment. Pt's memory is intact.  Pt denies legal history.  Pt's OP history includes currently receiving services from [facility] and [facility]. IP history includes most recent hospitalization last year at [facility].  Pt denies alcohol and substance abuse.  Pt is dressed in scrubs, alert, oriented x4 with normal speech and normal motor behavior. Eye contact is good. Pt's mood is depressed and affect congruent with mood. Thought process is coherent and relevant. There is no indication pt is currently responding to internal stimuli or  experiencing delusional thought content. Pt was cooperative throughout assessment. Pt is currently unable to contract for safety outside the hospital and wants inpatient psychiatric treatment.   Diagnosis: F33.2 Major Depressive Disorder, Recurrent, Severe  Past Medical History:  Past Medical History:  Diagnosis Date  . Anxiety   . Bipolar 1 disorder ([place])   . Cognitive deficits   . Depression   . Diabetes mellitus without complication ([place])   . Hypertension   . Mental disorder   . Obesity     Past Surgical History:  Procedure Laterality Date  . CESAREAN SECTION    . CESAREAN SECTION N/A 04/25/2013   Procedure: REPEAT CESAREAN SECTION;  Surgeon: [name], MD;  Location: [facility];  Service: Obstetrics;  Laterality: N/A;  . MASS EXCISION N/A 06/03/2012   Procedure: EXCISION MASS;  Surgeon: [name], MD;  Location: [place];  Service: ENT;  Laterality: N/A;  Excision uvula mass  . TONSILLECTOMY N/A 06/03/2012   Procedure: TONSILLECTOMY;  Surgeon: [name], MD;  Location: [place];  Service: ENT;  Laterality: N/A;  . TONSILLECTOMY      Family History:  Family History  Problem Relation Age of Onset  . Hypertension Mother   . Diabetes Father     Social History:  reports that she has been smoking cigarettes.  She has a 33.00 pack-year smoking history. She has never used smokeless tobacco. She reports that she does not drink alcohol or use drugs.  Additional Social History:  Alcohol / Drug Use Pain Medications: See MAR Prescriptions: See MAR Over the Counter: See MAR History of alcohol / drug use?: No history of alcohol /  drug abuse  CIWA: CIWA-Ar BP: 134/89 Pulse Rate: (!) 118 COWS:    Allergies:  Allergies  Allergen Reactions  . Wellbutrin [Bupropion] Shortness Of Breath  . Omnipaque [Iohexol] Swelling and Other (See Comments)    Reaction:  Eye swelling  . Penicillins Hives and Other (See Comments)    Has patient had a  PCN reaction causing immediate rash, facial/tongue/throat swelling, SOB or lightheadedness with hypotension: No Has patient had a PCN reaction causing severe rash involving mucus membranes or skin necrosis: No Has patient had a PCN reaction that required hospitalization No Has patient had a PCN reaction occurring within the last 10 years: No If all of the above answers are "NO", then may proceed with Cephalosporin use.  . Depakote Er [Divalproex Sodium Er] Nausea And Vomiting    Home Medications:  (Not in a hospital admission)  OB/GYN Status:  No LMP recorded. Patient has had an implant.  General Assessment Data Location of Assessment: [facility] ED TTS Assessment: In system Is this a Tele or Face-to-Face Assessment?: Tele Assessment Is this an Initial Assessment or a Re-assessment for this encounter?: Initial Assessment Marital status: Single Maiden name: NA Is patient pregnant?: No Pregnancy Status: No Living Arrangements: Alone Can pt return to current living arrangement?: Yes Admission Status: Voluntary Is patient capable of signing voluntary admission?: Yes Referral Source: Self/Family/Friend Insurance type: Medicaid     Crisis Care Plan Living Arrangements: Alone Name of Psychiatrist: Warden/ranger Name of Therapist: [facility]  Education Status Is patient currently in school?: No Highest grade of school patient has completed: 11 Is the patient employed, unemployed or receiving disability?: Receiving disability income  Risk to self with the past 6 months Suicidal Ideation: Yes-Currently Present Has patient been a risk to self within the past 6 months prior to admission? : Yes Suicidal Intent: Yes-Currently Present Has patient had any suicidal intent within the past 6 months prior to admission? : Yes Is patient at risk for suicide?: Yes Suicidal Plan?: Yes-Currently Present Has patient had any suicidal plan within the past 6 months prior to admission? : Yes Specify Current Suicidal  Plan: Overdose on medications Access to Means: Yes Specify Access to Suicidal Means: Pt has access to medications What has been your use of drugs/alcohol within the last 12 months?: Pt denies Previous Attempts/Gestures: Yes How many times?: 5(Pt states 5 or 6 times) Other Self Harm Risks: Pt denies Triggers for Past Attempts: Unpredictable Intentional Self Injurious Behavior: Cutting Comment - Self Injurious Behavior: Pt reports the last time she cut was 1 year ago Family Suicide History: No Recent stressful life event(s): Turmoil (Comment)(pt doesnt like living alone or the neighborhood) Persecutory voices/beliefs?: No Depression: Yes Depression Symptoms: Despondent, Loss of interest in usual pleasures, Feeling worthless/self pity, Guilt Substance abuse history and/or treatment for substance abuse?: No Suicide prevention information given to non-admitted patients: Not applicable  Risk to Others within the past 6 months Homicidal Ideation: No Does patient have any lifetime risk of violence toward others beyond the six months prior to admission? : No Thoughts of Harm to Others: No Current Homicidal Intent: No Current Homicidal Plan: No Access to Homicidal Means: No Identified Victim: Pt denies History of harm to others?: No Assessment of Violence: None Noted Violent Behavior Description: Pt denies Does patient have access to weapons?: No Criminal Charges Pending?: No Does patient have a court date: No Is patient on probation?: No  Psychosis Hallucinations: None noted Delusions: None noted  Mental Status Report Appearance/Hygiene: In scrubs Eye  Contact: Good Motor Activity: Freedom of movement Speech: Logical/coherent, Loud Level of Consciousness: Alert Mood: Depressed, Sad Affect: Depressed, Sad Anxiety Level: None Thought Processes: Coherent, Relevant Judgement: Impaired Orientation: Person, Place, Time, Situation Obsessive Compulsive Thoughts/Behaviors:  None  Cognitive Functioning Concentration: Normal Memory: Recent Intact, Remote Intact Is patient IDD: No Is patient DD?: Unknown Insight: Poor Impulse Control: Poor Appetite: Good Have you had any weight changes? : No Change Amount of the weight change? (lbs): 0 lbs Sleep: Increased Total Hours of Sleep: 10 Vegetative Symptoms: Staying in bed  ADLScreening [facility] Assessment Services) Patient's cognitive ability adequate to safely complete daily activities?: Yes Patient able to express need for assistance with ADLs?: Yes Independently performs ADLs?: Yes (appropriate for developmental age)  Prior Inpatient Therapy Prior Inpatient Therapy: Yes Prior Therapy Dates: MULTIPLE Prior Therapy Facilty/Provider(s): MULTIPLE: [place], [facility], [facility], [facility] Reason for Treatment: SI, MDD  Prior Outpatient Therapy Prior Outpatient Therapy: Yes Prior Therapy Dates: CURRENT Prior Therapy Facilty/Provider(s): [facility] and [facility] Reason for Treatment: MED MGMT & COUNSELING Does patient have an ACCT team?: Yes Does patient have Intensive In-House Services?  : No Does patient have [facility] services? : Yes Does patient have [facility] services?: No  ADL Screening (condition at time of admission) Patient's cognitive ability adequate to safely complete daily activities?: Yes Is the patient deaf or have difficulty hearing?: No Does the patient have difficulty seeing, even when wearing glasses/contacts?: No Does the patient have difficulty concentrating, remembering, or making decisions?: No Patient able to express need for assistance with ADLs?: Yes Does the patient have difficulty dressing or bathing?: No Independently performs ADLs?: Yes (appropriate for developmental age) Does the patient have difficulty walking or climbing stairs?: No Weakness of Legs: None Weakness of Arms/Hands: None  Home Assistive Devices/Equipment Home Assistive Devices/Equipment: None    Abuse/Neglect Assessment (Assessment to be  complete while patient is alone) Abuse/Neglect Assessment Can Be Completed: Yes Physical Abuse: Denies Verbal Abuse: Denies Sexual Abuse: Denies Exploitation of patient/patient's resources: Denies Self-Neglect: Denies     Regulatory affairs officer (For Healthcare) Does Patient Have a Medical Advance Directive?: No Would patient like information on creating a medical advance directive?: No - Patient declined          Disposition: Gave clinical report to Lindon Romp, NP who stated pt doesn't meet criteria for inpatient psychiatric treatment and has been recommended for overnight observation with reassessment in the morning.  Notified Caryl Pina, RN recommendation. Disposition Initial Assessment Completed for this Encounter: Yes Patient referred to: Other (Comment)(Overnight observation)  This service was provided via telemedicine using a 2-way, interactive audio and video technology.  Names of all persons participating in this telemedicine service and their role in this encounter. Name: [name] Role: Patient  Name: Abran Cantor, MS, [facility] Role: TTS Counselor  Name:  Role:   Name:  Role:    Abran Cantor, [place], [facility] Therapeutic Triage Specialist

## 2017-10-25 NOTE — ED Notes (Addendum)
Staffing notified; state no sitter available until 11p.m. Charge Nurse notified.

## 2017-10-25 NOTE — ED Triage Notes (Signed)
Patient states "I got to get out of this body"; claims suicidal and homicidal ideation.

## 2017-10-25 NOTE — ED Notes (Signed)
Patient has 3 shirts, 2 pairs of pants, 1 pair of shoes, 2 underwear, 1 bra, cell phone with charger, Beige purse, set of keys, EBT card and $8.37 cash; No valuables placed in locker #2; Valuables sent to Security and Medications sent to [facility]

## 2017-10-25 NOTE — ED Notes (Signed)
Tele-psych computer at bedside awaiting [facility] evaluation

## 2017-10-25 NOTE — ED Provider Notes (Signed)
[place] EMERGENCY DEPARTMENT Provider Note   CSN: [id number] Arrival date & time: 10/25/17  1942     History   Chief Complaint Chief Complaint  Patient presents with  . Suicidal    HPI [name] is a 27 y.o. female.  Patient presents for suicidal ideation. She denies self harm prior to arrival. She is also having thoughts of wanting to hurt others. No hallucinations, substance abuse. She denies physical complaints.  The history is provided by the patient. No language interpreter was used.    Past Medical History:  Diagnosis Date  . Anxiety   . Bipolar 1 disorder ([place])   . Cognitive deficits   . Depression   . Diabetes mellitus without complication ([place])   . Hypertension   . Mental disorder   . Obesity     Patient Active Problem List   Diagnosis Date Noted  . Psychosis ([place])   . 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 LGSIL on Pap smear of cervix 02/01/2017  . OCD (obsessive compulsive disorder) 10/05/2016  . Major depressive disorder, recurrent episode, mild ([place]) 05/04/2016  . 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 Learning disability 07/18/2015  . 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 Diabetes mellitus ([place]) 07/18/2015  . MDD (major depressive disorder), recurrent, severe, with psychosis ([place]) 07/18/2015  . Hyperlipidemia 07/18/2015  . Severe episode of recurrent major depressive disorder, without psychotic features ([place])   . Drug overdose   . Cognitive deficits 10/12/2012  . Generalized anxiety disorder 06/28/2012    Past Surgical History:  Procedure Laterality Date  . CESAREAN SECTION    . CESAREAN SECTION N/A 04/25/2013   Procedure: REPEAT CESAREAN SECTION;  Surgeon: [name], MD;  Location: [facility];  Service: Obstetrics;  Laterality: N/A;  . MASS EXCISION N/A 06/03/2012   Procedure: EXCISION MASS;  Surgeon: [name], MD;  Location: [place];   Service: ENT;  Laterality: N/A;  Excision uvula mass  . TONSILLECTOMY N/A 06/03/2012   Procedure: TONSILLECTOMY;  Surgeon: [name], MD;  Location: [facility];  Service: ENT;  Laterality: N/A;  . TONSILLECTOMY       OB History    Gravida  3   Para  3   Term  3   Preterm  0   AB  0   Living  3     SAB  0   TAB  0   Ectopic  0   Multiple  0   Live Births  3            Home Medications    Prior to Admission medications   Medication Sig Start Date End Date Taking? Authorizing Provider  busPIRone (BUSPAR) 15 MG tablet Take 1 tablet (15 mg total) by mouth 2 (two) times daily. For anxiety Patient taking differently: Take 15 mg by mouth 3 (three) times daily. For anxiety 10/06/16  Yes Nwoko, Herbert Pun I, NP  DULoxetine (CYMBALTA) 20 MG capsule Take 40 mg by mouth 2 (two) times daily.    Yes [provider]  etonogestrel (NEXPLANON) 68 MG IMPL implant 1 each by Subdermal route once. Implanted Summer 2018   Yes [provider]  fluvoxaMINE (LUVOX) 100 MG tablet Take 100 mg by mouth 2 (two) times daily. 10/18/17  Yes [provider]  glipiZIDE (GLUCOTROL) 10 MG tablet Take 10 mg by mouth 2 (two) times daily before a meal.   Yes [provider]  hydrochlorothiazide (HYDRODIURIL) 25 MG tablet Take 25 mg by mouth daily.   Yes [provider]  Insulin Detemir (LEVEMIR FLEXTOUCH) 100 UNIT/ML Pen Inject 40 Units into the skin 2 (two) times daily after a meal.   Yes [provider]  paliperidone (INVEGA SUSTENNA) 234 MG/1.5ML SUSP injection Inject 234 mg every 30 (thirty) days into the muscle.   Yes [provider]  fluvoxaMINE (LUVOX) 50 MG tablet Take 1 tablet (50 mg total) by mouth at bedtime. Patient not taking: Reported on 10/25/2017 10/10/17   Patrecia Pour, NP  paliperidone (INVEGA) 3 MG 24 hr tablet Take 1 tablet (3 mg total) by mouth daily. Patient not taking: Reported on 10/25/2017 10/20/17   Patrecia Pour, NP    Family History Family History  Problem Relation Age of Onset  . Hypertension Mother   . Diabetes Father     Social History Social History   Tobacco Use  . Smoking status: Current Every Day Smoker    Packs/day: 3.00    Years: 11.00    Pack years: 33.00    Types: Cigarettes  . Smokeless tobacco: Never Used  Substance Use Topics  . Alcohol use: No  . Drug use: No    Comment: Patient denies     Allergies   Wellbutrin [bupropion]; Omnipaque [iohexol]; Penicillins; and Depakote er [divalproex sodium er]   Review of Systems Review of Systems  Constitutional: Negative for chills and fever.  HENT: Negative.   Respiratory: Negative.   Cardiovascular: Negative.   Gastrointestinal: Negative.   Musculoskeletal: Negative.   Skin: Negative.   Neurological: Negative.   Psychiatric/Behavioral: Positive for suicidal ideas. Negative for hallucinations.     Physical Exam Updated Vital Signs BP 134/89   Pulse (!) 118   Temp 98.7 F (37.1 C) (Oral)   Resp 20   Ht 5\' 7"  (1.702 m)   Wt 113.4 kg (250 lb)   SpO2 99%   BMI 39.16 kg/m   Physical Exam  Constitutional: She is oriented to person, place, and time. She appears well-developed and well-nourished.  HENT:  Head: Normocephalic.  Neck: Normal range of motion. Neck supple.  Cardiovascular: Normal rate and regular rhythm.  Pulmonary/Chest: Effort normal and breath sounds normal. She has no wheezes. She has no rales.  Abdominal: Soft. Bowel sounds are normal. There is no tenderness. There is no rebound and no guarding.  Musculoskeletal: Normal range of motion.  Neurological: She is alert and oriented to person, place, and time.  Skin: Skin is warm and dry. No rash noted.  Psychiatric: She is withdrawn. She is not actively hallucinating. She exhibits a depressed mood. She expresses homicidal and suicidal ideation.     ED Treatments / Results  Labs (all labs ordered are listed, but only abnormal  results are displayed) Labs Reviewed  COMPREHENSIVE METABOLIC PANEL - Abnormal; Notable for the following components:      Result Value   Potassium 3.3 (*)    Glucose, Bld 185 (*)    All other components within normal limits  ACETAMINOPHEN LEVEL - Abnormal; Notable for the following components:   Acetaminophen (Tylenol), Serum <10 (*)    All other components within normal limits  CBC - Abnormal; Notable for the following components:   WBC 11.1 (*)    All other components within normal limits  RAPID URINE DRUG SCREEN, HOSP PERFORMED - Abnormal; Notable for the following components:   Barbiturates   (*)    Value: Result not available. Reagent  lot number recalled by manufacturer.   All other components within normal limits  ETHANOL  SALICYLATE LEVEL  I-STAT BETA HCG BLOOD, ED ([facility], [facility], [facility] ONLY)    EKG None  Radiology No results found.  Procedures Procedures (including critical care time)  Medications Ordered in ED Medications  potassium chloride SA (K-DUR,KLOR-CON) CR tablet 40 mEq (has no administration in time range)  LORazepam (ATIVAN) tablet 1 mg (1 mg Oral Given 10/25/17 2041)     Initial Impression / Assessment and Plan / ED Course  I have reviewed the triage vital signs and the nursing notes.  Pertinent labs & imaging results that were available during my care of the patient were reviewed by me and considered in my medical decision making (see chart for details).     Patient presents for help with SI/HI, denies hallucinations or substance abuse. She is medically cleared for evaluation by TTS to determine disposition.    Final Clinical Impressions(s) / ED Diagnoses   Final diagnoses:  None   1. Suicidal ideations 2. Homicidal ideations  ED Discharge Orders    None       Charlann Lange, Hershal Coria 10/25/17 2250    Charlesetta Shanks, MD 11/02/17 1255

## 2017-10-25 NOTE — ED Notes (Signed)
BH assessment being completed at this time.

## 2017-10-25 NOTE — ED Provider Notes (Signed)
Patient placed in Quick Look pathway, seen and evaluated   Chief Complaint:  suicidal  HPI:    Patient presents for evaluation of suicidal and homicidal ideations. Appears to be manic. Exhibits rambling speech, answers some questions appropriately. States that she took 80 mg of her Cymbalta today instead of 40mg .  Not particularly cooperative with exam. Denies any medical complaints and states "my stomach doesn't hurt me for once ".  ROS:  Positive for suicidal ideation, homiciidal ideation  Physical Exam:   Gen: No distress  Neuro: Awake and Alert  Skin: Warm    Focused Exam:  Exhibits rambling speech. Emotionally labile. Tachycardic. Lungs clear to auscultation. Abdomen soft and nontender. Does not appear to respond to internal stimuli at this time.   Initiation of care has begun. The patient has been counseled on the process, plan, and necessity for staying for the completion/evaluation, and the remainder of the medical screening examination    Debroah Baller 10/25/17 2002    Mesner, Corene Cornea, MD 10/25/17 2142

## 2017-10-26 ENCOUNTER — Emergency Department (EMERGENCY_DEPARTMENT_HOSPITAL)
Admission: EM | Admit: 2017-10-26 | Discharge: 2017-10-27 | Disposition: A | Discharge status: Home / Self Care | Payer: Medicaid Other | Source: Home / Self Care | Attending: Emergency Medicine | Admitting: Emergency Medicine

## 2017-10-26 ENCOUNTER — Other Ambulatory Visit: Payer: Self-pay

## 2017-10-26 ENCOUNTER — Encounter (HOSPITAL_COMMUNITY): Payer: Self-pay

## 2017-10-26 DIAGNOSIS — F333 Major depressive disorder, recurrent, severe with psychotic symptoms: Secondary | ICD-10-CM | POA: Diagnosis present

## 2017-10-26 DIAGNOSIS — R4189 Other symptoms and signs involving cognitive functions and awareness: Secondary | ICD-10-CM | POA: Diagnosis present

## 2017-10-26 DIAGNOSIS — T50902A Poisoning by unspecified drugs, medicaments and biological substances, intentional self-harm, initial encounter: Secondary | ICD-10-CM

## 2017-10-26 DIAGNOSIS — F330 Major depressive disorder, recurrent, mild: Secondary | ICD-10-CM | POA: Diagnosis present

## 2017-10-26 DIAGNOSIS — F819 Developmental disorder of scholastic skills, unspecified: Secondary | ICD-10-CM | POA: Diagnosis present

## 2017-10-26 DIAGNOSIS — F332 Major depressive disorder, recurrent severe without psychotic features: Secondary | ICD-10-CM

## 2017-10-26 LAB — RAPID URINE DRUG SCREEN, HOSP PERFORMED
AMPHETAMINES: NOT DETECTED
BENZODIAZEPINES: NOT DETECTED
COCAINE: NOT DETECTED
Opiates: NOT DETECTED
TETRAHYDROCANNABINOL: NOT DETECTED

## 2017-10-26 LAB — COMPREHENSIVE METABOLIC PANEL
ALBUMIN: 4 g/dL (ref 3.5–5.0)
ALK PHOS: 45 U/L (ref 38–126)
ALT: 24 U/L (ref 0–44)
ANION GAP: 10 (ref 5–15)
AST: 34 U/L (ref 15–41)
BILIRUBIN TOTAL: 0.3 mg/dL (ref 0.3–1.2)
BUN: 11 mg/dL (ref 6–20)
CO2: 27 mmol/L (ref 22–32)
Calcium: 9.1 mg/dL (ref 8.9–10.3)
Chloride: 104 mmol/L (ref 98–111)
Creatinine, Ser: 0.89 mg/dL (ref 0.44–1.00)
GFR calc Af Amer: >60 mL/min (ref >60)
GFR calc non Af Amer: >60 mL/min (ref >60)
Glucose, Bld: 186 mg/dL — ABNORMAL HIGH (ref 70–99)
POTASSIUM: 3.3 mmol/L — AB (ref 3.5–5.1)
SODIUM: 141 mmol/L (ref 135–145)
Total Protein: 7.3 g/dL (ref 6.5–8.1)

## 2017-10-26 LAB — CBC
HEMATOCRIT: 39.2 % (ref 36.0–46.0)
HEMOGLOBIN: 12.8 g/dL (ref 12.0–15.0)
MCH: 27.2 pg (ref 26.0–34.0)
MCHC: 32.7 g/dL (ref 30.0–36.0)
MCV: 83.4 fL (ref 78.0–100.0)
Platelets: 245 10*3/uL (ref 150–400)
RBC: 4.7 MIL/uL (ref 3.87–5.11)
RDW: 14 % (ref 11.5–15.5)
WBC: 9.6 10*3/uL (ref 4.0–10.5)

## 2017-10-26 LAB — CBG MONITORING, ED
GLUCOSE-CAPILLARY: 208 mg/dL — AB (ref 70–99)
GLUCOSE-CAPILLARY: 196 mg/dL — AB (ref 70–99)

## 2017-10-26 LAB — I-STAT BETA HCG BLOOD, ED (MC, WL, AP ONLY): I-stat hCG, quantitative: <5 m[IU]/mL (ref <5)

## 2017-10-26 LAB — ETHANOL: Alcohol, Ethyl (B): <10 mg/dL (ref <10)

## 2017-10-26 LAB — ACETAMINOPHEN LEVEL

## 2017-10-26 LAB — SALICYLATE LEVEL: Salicylate Lvl: <7 mg/dL (ref 2.8–30.0)

## 2017-10-26 NOTE — ED Notes (Signed)
Pt escorted to lobby to pt's ACT Team Member. Pt denies SI/HI. States she is ready to go home.

## 2017-10-26 NOTE — Progress Notes (Signed)
Patient has an ACT team that needs to be notified so they can manage their client.  Waylan Boga, PMHNP

## 2017-10-26 NOTE — ED Provider Notes (Signed)
Patient cleared by behavioral health for discharge home.     Fredia Sorrow, MD 10/26/17 1414

## 2017-10-26 NOTE — ED Notes (Signed)
Offered pt snack x 2 - declined.

## 2017-10-26 NOTE — ED Provider Notes (Addendum)
[facility] DEPT Provider Note   CSN: [id number] Arrival date & time: 10/26/17  2116     History   Chief Complaint Chief Complaint  Patient presents with  . Drug Overdose  . Suicidal    HPI [name] is a 27 y.o. female.  HPI Patient states she took 7 of her Cymbalta 1 hour prior to coming to the emergency department.  She states this was attempted to kill herself.  She states she is unhappy living by herself and is bored.  She wants to live in a group home.  She states she is under increased stress but does not know why.  Has had difficulty sleeping.  Denies any other coingestions.  Denies visual or auditory hallucinations. Past Medical History:  Diagnosis Date  . Anxiety   . Bipolar 1 disorder ([place])   . Cognitive deficits   . Depression   . Diabetes mellitus without complication ([place])   . Hypertension   . Mental disorder   . Obesity     Patient Active Problem List   Diagnosis Date Noted  . Psychosis ([place])   . 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 LGSIL on Pap smear of cervix 02/01/2017  . OCD (obsessive compulsive disorder) 10/05/2016  . Major depressive disorder, recurrent episode, mild ([place]) 05/04/2016  . 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 Learning disability 07/18/2015  . 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 Diabetes mellitus ([place]) 07/18/2015  . 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 Hyperlipidemia 07/18/2015  . Severe episode of recurrent major depressive disorder, without psychotic features ([place])   . Drug overdose   . Cognitive deficits 10/12/2012  . Generalized anxiety disorder 06/28/2012    Past Surgical History:  Procedure Laterality Date  . CESAREAN SECTION    . CESAREAN SECTION N/A 04/25/2013   Procedure: REPEAT CESAREAN SECTION;  Surgeon: [name], MD;  Location: [facility];  Service: Obstetrics;   Laterality: N/A;  . MASS EXCISION N/A 06/03/2012   Procedure: EXCISION MASS;  Surgeon: [name], MD;  Location: [facility];  Service: ENT;  Laterality: N/A;  Excision uvula mass  . TONSILLECTOMY N/A 06/03/2012   Procedure: TONSILLECTOMY;  Surgeon: [name], MD;  Location: [facility];  Service: ENT;  Laterality: N/A;  . TONSILLECTOMY       OB History    Gravida  3   Para  3   Term  3   Preterm  0   AB  0   Living  3     SAB  0   TAB  0   Ectopic  0   Multiple  0   Live Births  3            Home Medications    Prior to Admission medications   Medication Sig Start Date End Date Taking? Authorizing Provider  busPIRone (BUSPAR) 15 MG tablet Take 1 tablet (15 mg total) by mouth 2 (two) times daily. For anxiety Patient taking differently: Take 15 mg by mouth 3 (three) times daily. For anxiety 10/06/16  Yes Nwoko, Herbert Pun I, NP  DULoxetine (CYMBALTA) 20 MG capsule Take 40 mg by mouth 2 (two) times daily.    Yes [provider]  etonogestrel (NEXPLANON) 68 MG IMPL implant 1 each by Subdermal route once. Implanted Summer 2018   Yes [provider]  fluvoxaMINE (LUVOX) 100 MG tablet Take 100 mg by mouth  2 (two) times daily. 10/18/17  Yes [provider]  glipiZIDE (GLUCOTROL) 10 MG tablet Take 10 mg by mouth 2 (two) times daily before a meal.   Yes [provider]  hydrochlorothiazide (HYDRODIURIL) 25 MG tablet Take 25 mg by mouth daily.   Yes [provider]  Insulin Detemir (LEVEMIR FLEXTOUCH) 100 UNIT/ML Pen Inject 40 Units into the skin 2 (two) times daily after a meal.   Yes [provider]  paliperidone (INVEGA SUSTENNA) 234 MG/1.5ML SUSP injection Inject 234 mg every 30 (thirty) days into the muscle.   Yes [provider]  fluvoxaMINE (LUVOX) 50 MG tablet Take 1 tablet (50 mg total) by mouth at bedtime. Patient not taking: Reported on 10/26/2017 10/10/17   Patrecia Pour,  NP  paliperidone (INVEGA) 3 MG 24 hr tablet Take 1 tablet (3 mg total) by mouth daily. Patient not taking: Reported on 10/25/2017 10/20/17   Patrecia Pour, NP    Family History Family History  Problem Relation Age of Onset  . Hypertension Mother   . Diabetes Father     Social History Social History   Tobacco Use  . Smoking status: Current Every Day Smoker    Packs/day: 3.00    Years: 11.00    Pack years: 33.00    Types: Cigarettes  . Smokeless tobacco: Never Used  Substance Use Topics  . Alcohol use: No  . Drug use: No    Comment: Patient denies     Allergies   Wellbutrin [bupropion]; Omnipaque [iohexol]; Penicillins; and Depakote er [divalproex sodium er]   Review of Systems Review of Systems  Constitutional: Negative for chills and fever.  Eyes: Negative for visual disturbance.  Respiratory: Negative for cough and shortness of breath.   Cardiovascular: Negative for chest pain.  Gastrointestinal: Negative for abdominal pain, constipation, diarrhea, nausea and vomiting.  Genitourinary: Negative for dysuria, flank pain, frequency and hematuria.  Musculoskeletal: Negative for back pain, joint swelling, myalgias and neck pain.  Skin: Negative for rash and wound.  Neurological: Negative for dizziness, speech difficulty, weakness, light-headedness, numbness and headaches.  Psychiatric/Behavioral: Positive for dysphoric mood, sleep disturbance and suicidal ideas. The patient is nervous/anxious.   All other systems reviewed and are negative.    Physical Exam Updated Vital Signs BP 120/76 (BP Location: Right Arm)   Pulse 96   Temp 99 F (37.2 C) (Oral)   Resp 18   Ht 5\' 7"  (1.702 m)   Wt 113.4 kg (250 lb)   SpO2 98%   BMI 39.16 kg/m   Physical Exam  Constitutional: She is oriented to person, place, and time. She appears well-developed and well-nourished. No distress.  HENT:  Head: Normocephalic and atraumatic.  Mouth/Throat: Oropharynx is clear and moist. No  oropharyngeal exudate.  Eyes: Pupils are equal, round, and reactive to light. EOM are normal.  Neck: Normal range of motion. Neck supple. No JVD present.  Cardiovascular: Normal rate and regular rhythm. Exam reveals no gallop and no friction rub.  No murmur heard. Pulmonary/Chest: Effort normal and breath sounds normal. No stridor. No respiratory distress. She has no wheezes. She has no rales. She exhibits no tenderness.  Abdominal: Soft. Bowel sounds are normal. There is no tenderness. There is no rebound and no guarding.  Musculoskeletal: Normal range of motion. She exhibits no edema or tenderness.  Lymphadenopathy:    She has no cervical adenopathy.  Neurological: She is alert and oriented to person, place, and time.  Moving all extremities without focal  deficit.  Sensation intact.  Skin: Skin is warm and dry. Capillary refill takes less than 2 seconds. No rash noted. She is not diaphoretic. No erythema.  Psychiatric:  Dysphoric.  Flat affect.  Acknowledges SI.  Nursing note and vitals reviewed.    ED Treatments / Results  Labs (all labs ordered are listed, but only abnormal results are displayed) Labs Reviewed  COMPREHENSIVE METABOLIC PANEL - Abnormal; Notable for the following components:      Result Value   Potassium 3.3 (*)    Glucose, Bld 186 (*)    All other components within normal limits  ACETAMINOPHEN LEVEL - Abnormal; Notable for the following components:   Acetaminophen (Tylenol), Serum <10 (*)    All other components within normal limits  RAPID URINE DRUG SCREEN, HOSP PERFORMED - Abnormal; Notable for the following components:   Barbiturates   (*)    Value: Result not available. Reagent lot number recalled by manufacturer.   All other components within normal limits  CBG MONITORING, ED - Abnormal; Notable for the following components:   Glucose-Capillary 196 (*)    All other components within normal limits  ETHANOL  SALICYLATE LEVEL  CBC  I-STAT BETA HCG BLOOD,  ED ([facility], [facility], [facility] ONLY)    EKG EKG Interpretation  Date/Time:  Tuesday October 26 2017 21:29:19 EDT Ventricular Rate:  88 PR Interval:    QRS Duration: 95 QT Interval:  355 QTC Calculation: 430 R Axis:   44 Text Interpretation:  Sinus rhythm no change Confirmed by Charlesetta Shanks [phone]) on 10/27/2017 4:15:28 PM   Radiology No results found.  Procedures Procedures (including critical care time)  Medications Ordered in ED Medications - No data to display   Initial Impression / Assessment and Plan / ED Course  I have reviewed the triage vital signs and the nursing notes.  Pertinent labs & imaging results that were available during my care of the patient were reviewed by me and considered in my medical decision making (see chart for details).    Staff contacted poison control.  Recommend 6-hour observation.  Vital signs remained stable. Signed out to Dr. Stark Jock. Will need TTS eval when medically cleared   Final Clinical Impressions(s) / ED Diagnoses   Final diagnoses:  Intentional drug overdose, initial encounter [facility])  Severe episode of recurrent major depressive disorder, without psychotic features [facility])    ED Discharge Orders    None       Julianne Rice, MD 10/28/17 1741    Julianne Rice, MD 10/28/17 1742

## 2017-10-26 NOTE — Discharge Instructions (Addendum)
Cleared by behavioral health for discharge home.  Follow-up as per behavioral health. 

## 2017-10-26 NOTE — ED Triage Notes (Signed)
Patient seen at [facility] earlier today for SI and d/c. Patient went home and took 7 Cymbalta stating she is still suicidal. States she does not feel safe at home.

## 2017-10-26 NOTE — ED Notes (Signed)
Per Romie Minus, [facility], pt's ACT Team Member advised will be arriving in approx 15 min to pick up pt.

## 2017-10-26 NOTE — ED Notes (Signed)
Poison control contacted this writer: -6 hour OBS -monitor qTc -seizure precautions -medical clearance lab work and 12 lead EKG

## 2017-10-26 NOTE — ED Notes (Signed)
ALL belongings - 2 labeled belongings bags, 1 Valuables Envelope, and Home Meds - returned to pt - Pt signed verifying all items present.

## 2017-10-26 NOTE — Consult Note (Signed)
[facility] Consult   Reason for Consult:  Suicidal ideations Referring Physician:  EDP Patient Identification: [name] MRN:  [id number] Principal Diagnosis: Severe episode of recurrent major depressive disorder, without psychotic features ([place]) Diagnosis:   Patient Active Problem List   Diagnosis Date Noted  . Psychosis ([place]) [F29]   . Adjustment disorder with mixed disturbance of emotions and conduct [F43.25] 08/03/2017  . LGSIL on Pap smear of cervix [R87.612] 02/01/2017  . OCD (obsessive compulsive disorder) [F42.9] 10/05/2016  . Major depressive disorder, recurrent episode, mild ([place]) [F33.0] 05/04/2016  . 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 Learning disability [F81.9] 07/18/2015  . 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 Diabetes mellitus ([place]) [E11.9] 07/18/2015  . MDD (major depressive disorder), recurrent, severe, with psychosis ([place]) [F33.3] 07/18/2015  . Hyperlipidemia [E78.5] 07/18/2015  . Severe episode of recurrent major depressive disorder, without psychotic features ([place]) [F33.2]   . Drug overdose [T50.901A]   . Cognitive deficits [R41.89] 10/12/2012  . Generalized anxiety disorder [F41.1] 06/28/2012    Total Time spent with patient: 30 minutes  Subjective:   [name] is a 27 y.o. female patient admitted does not warrant admission.  HPI: [name] is an 27 y.o. female .who presents voluntarily to [place] police reporting symptoms of depression and suicidal ideation. Pt has a history of suicidal ideation and depression.  Pt has an extensive mental health history and has presented to emergency departments 28 times in the past 6 months.  Pt reports being compliant on her medications, however she reports they aren't working for her.  Pt reports current suicidal ideation with plans of overdosing on her medications. Pt reports 5 or 6 past attempts. Pt acknowledges symptoms including: sadness, fatigue, guilt,  low self esteem, lack of motivation, staying in bed more, irritability, negative outlook, helplessness, sleeping more and eating more.  Pt denies homicidal ideation/ history of violence. Pt denies auditory or visual hallucinations or other psychotic symptoms. Pt states current stressors includes being under a lot of stress, her medications not working, feeling like killing herself, not liking her apartment or neighborhood.   Pt lives alone and denies having any supports.   Pt denies hstory of abuse and trauma . Pt denies family history of SI/MH/SA. Pt is unemployed and receiving disability. Pt has poor insight and poor judgment. Pt's memory is intact.  Pt denies legal history.  Pt's OP history includes currently receiving services [facility] and [facility]. IP history includes most recent hospitalization last year at [facility].  Pt denies alcohol and substance abuse.  Pt is dressed in scrubs, alert, oriented x4 with normal speech and normal motor behavior. Eye contact is good. Pt's mood is depressed and affect congruent with mood. Thought process is coherent and relevant. There is no indication pt is currently responding to internal stimuli or experiencing delusional thought content. Pt was cooperative throughout assessment. Pt is currently unable to contract for safety outside the hospital and wants inpatient psychiatric treatment.   [facility] contacted, stable for discharge. EDP notified  Past Psychiatric History:   Depression, OCD  Risk to Self: None Risk to Others: Homicidal Ideation: No Thoughts of Harm to Others: No Current Homicidal Intent: No Current Homicidal Plan: No Access to Homicidal Means: No Identified Victim: Pt denies History of harm to others?: No Assessment of Violence: None Noted Violent Behavior Description: Pt denies Does patient have access to weapons?: No Criminal Charges Pending?: No Does patient have a court date: No Prior  Inpatient Therapy: Prior Inpatient Therapy:  Yes Prior Therapy Dates: MULTIPLE Prior Therapy Facilty/Provider(s): MULTIPLE: [facility], [facility], [facility], [facility] Reason for Treatment: SI, MDD Prior Outpatient Therapy: Prior Outpatient Therapy: Yes Prior Therapy Dates: CURRENT Prior Therapy Facilty/Provider(s): [facility] and [facility] Reason for Treatment: MED MGMT & COUNSELING Does patient have an ACCT team?: Yes Does patient have Intensive In-House Services?  : No Does patient have [facility] services? : Yes Does patient have [facility] services?: No  Past Medical History:  Past Medical History:  Diagnosis Date  . Anxiety   . Bipolar 1 disorder ([place])   . Cognitive deficits   . Depression   . Diabetes mellitus without complication ([place])   . Hypertension   . Mental disorder   . Obesity     Past Surgical History:  Procedure Laterality Date  . CESAREAN SECTION    . CESAREAN SECTION N/A 04/25/2013   Procedure: REPEAT CESAREAN SECTION;  Surgeon: [name], MD;  Location: [facility];  Service: Obstetrics;  Laterality: N/A;  . MASS EXCISION N/A 06/03/2012   Procedure: EXCISION MASS;  Surgeon: [name], MD;  Location: [facility];  Service: ENT;  Laterality: N/A;  Excision uvula mass  . TONSILLECTOMY N/A 06/03/2012   Procedure: TONSILLECTOMY;  Surgeon: [name], MD;  Location: [facility];  Service: ENT;  Laterality: N/A;  . TONSILLECTOMY     Family History:  Family History  Problem Relation Age of Onset  . Hypertension Mother   . Diabetes Father    Family Psychiatric  History: none Social History:  Social History   Substance and Sexual Activity  Alcohol Use No     Social History   Substance and Sexual Activity  Drug Use No   Comment: Patient denies    Social History   Socioeconomic History  . Marital status: Single    Spouse name: Not on file  . Number of children: Not on file  . Years of education: Not on file  . Highest education level: Not on file  Occupational History  . Not on file  Social  Needs  . Financial resource strain: Not on file  . Food insecurity:    Worry: Not on file    Inability: Not on file  . Transportation needs:    Medical: Not on file    Non-medical: Not on file  Tobacco Use  . Smoking status: Current Every Day Smoker    Packs/day: 3.00    Years: 11.00    Pack years: 33.00    Types: Cigarettes  . Smokeless tobacco: Never Used  Substance and Sexual Activity  . Alcohol use: No  . Drug use: No    Comment: Patient denies  . Sexual activity: Yes    Birth control/protection: Implant  Lifestyle  . Physical activity:    Days per week: Not on file    Minutes per session: Not on file  . Stress: Not on file  Relationships  . Social connections:    Talks on phone: Not on file    Gets together: Not on file    Attends religious service: Not on file    Active member of club or organization: Not on file    Attends meetings of clubs or organizations: Not on file    Relationship status: Not on file  Other Topics Concern  . Not on file  Social History Narrative  . Not on file   Additional Social History: N/A    Allergies:   Allergies  Allergen  Reactions  . Wellbutrin [Bupropion] Shortness Of Breath  . Omnipaque [Iohexol] Swelling and Other (See Comments)    Reaction:  Eye swelling  . Penicillins Hives and Other (See Comments)    Has patient had a PCN reaction causing immediate rash, facial/tongue/throat swelling, SOB or lightheadedness with hypotension: No Has patient had a PCN reaction causing severe rash involving mucus membranes or skin necrosis: No Has patient had a PCN reaction that required hospitalization No Has patient had a PCN reaction occurring within the last 10 years: No If all of the above answers are "NO", then may proceed with Cephalosporin use.  . Depakote Er [Divalproex Sodium Er] Nausea And Vomiting    Labs:  Results for orders placed or performed during the hospital encounter of 10/25/17 (from the past 48 hour(s))  Rapid  urine drug screen (hospital performed)     Status: Abnormal   Collection Time: 10/25/17  7:50 PM  Result Value Ref Range   Opiates NONE DETECTED NONE DETECTED   Cocaine NONE DETECTED NONE DETECTED   Benzodiazepines NONE DETECTED NONE DETECTED   Amphetamines NONE DETECTED NONE DETECTED   Tetrahydrocannabinol NONE DETECTED NONE DETECTED   Barbiturates (A) NONE DETECTED    Result not available. Reagent lot number recalled by manufacturer.    Comment: Performed at [facility], [place] [address] [place], [place] [address]  Comprehensive metabolic panel     Status: Abnormal   Collection Time: 10/25/17  8:04 PM  Result Value Ref Range   Sodium 139 135 - 145 mmol/L   Potassium 3.3 (L) 3.5 - 5.1 mmol/L   Chloride 103 98 - 111 mmol/L    Comment: Please note change in reference range.   CO2 28 22 - 32 mmol/L   Glucose, Bld 185 (H) 70 - 99 mg/dL    Comment: Please note change in reference range.   BUN 7 6 - 20 mg/dL    Comment: Please note change in reference range.   Creatinine, Ser 0.88 0.44 - 1.00 mg/dL   Calcium 9.5 8.9 - 10.3 mg/dL   Total Protein 7.5 6.5 - 8.1 g/dL   Albumin 4.2 3.5 - 5.0 g/dL   AST 33 15 - 41 U/L   ALT 27 0 - 44 U/L    Comment: Please note change in reference range.   Alkaline Phosphatase 46 38 - 126 U/L   Total Bilirubin 0.5 0.3 - 1.2 mg/dL   GFR calc non Af Amer >60 >60 mL/min   GFR calc Af Amer >60 >60 mL/min    Comment: (NOTE) The eGFR has been calculated using the CKD EPI equation. This calculation has not been validated in all clinical situations. eGFR's persistently <60 mL/min signify possible Chronic Kidney Disease.    Anion gap 8 5 - 15    Comment: Performed at [facility] [address], [place], [place] [address]  Ethanol     Status: None   Collection Time: 10/25/17  8:04 PM  Result Value Ref Range   Alcohol, Ethyl (B) <10 <10 mg/dL    Comment: (NOTE) Lowest detectable limit for serum alcohol is 10 mg/dL. For medical purposes  only. Performed at [facility], [place] [address], [place],  [address]   Salicylate level     Status: None   Collection Time: 10/25/17  8:04 PM  Result Value Ref Range   Salicylate Lvl <9.3 2.8 - 30.0 mg/dL    Comment: Performed at [facility]  [address] [place], [place] [address]  Acetaminophen level     Status: Abnormal   Collection Time: 10/25/17  8:04 PM  Result Value Ref Range   Acetaminophen (Tylenol), Serum <10 (L) 10 - 30 ug/mL    Comment: (NOTE) Therapeutic concentrations vary significantly. A range of 10-30 ug/mL  may be an effective concentration for many patients. However, some  are best treated at concentrations outside of this range. Acetaminophen concentrations >150 ug/mL at 4 hours after ingestion  and >50 ug/mL at 12 hours after ingestion are often associated with  toxic reactions. Performed at [facility], [place] [address], [place], [place] [address]   cbc     Status: Abnormal   Collection Time: 10/25/17  8:04 PM  Result Value Ref Range   WBC 11.1 (H) 4.0 - 10.5 K/uL   RBC 5.08 3.87 - 5.11 MIL/uL   Hemoglobin 13.5 12.0 - 15.0 g/dL   HCT 42.0 36.0 - 46.0 %   MCV 82.7 78.0 - 100.0 fL   MCH 26.6 26.0 - 34.0 pg   MCHC 32.1 30.0 - 36.0 g/dL   RDW 13.7 11.5 - 15.5 %   Platelets 270 150 - 400 K/uL    Comment: Performed at [facility] [address] [place], [place] [address]  I-Stat beta hCG blood, ED     Status: None   Collection Time: 10/25/17  8:20 PM  Result Value Ref Range   I-stat hCG, quantitative <5.0 <5 mIU/mL   Comment 3            Comment:   GEST. AGE      CONC.  (mIU/mL)   <=1 WEEK        5 - 50     2 WEEKS       50 - 500     3 WEEKS       100 - 10,000     4 WEEKS     1,000 - 30,000        FEMALE AND NON-PREGNANT FEMALE:     LESS THAN 5 mIU/mL   CBG monitoring, ED     Status: Abnormal   Collection Time: 10/26/17  7:07 AM  Result Value Ref Range   Glucose-Capillary 208 (H) 70 - 99 mg/dL    Current  Facility-Administered Medications  Medication Dose Route Frequency Provider Last Rate Last Dose  . fluvoxaMINE (LUVOX) tablet 100 mg  100 mg Oral BID Charlann Lange, PA-C   100 mg at 10/26/17 1007  . glipiZIDE (GLUCOTROL) tablet 10 mg  10 mg Oral BID AC Upstill, Shari, PA-C   10 mg at 10/26/17 0805  . hydrochlorothiazide (HYDRODIURIL) tablet 25 mg  25 mg Oral Daily Upstill, Nehemiah Settle, PA-C   25 mg at 10/26/17 1007  . insulin detemir (LEVEMIR) injection 40 Units  40 Units Subcutaneous BID PC Charlann Lange, PA-C   40 Units at 10/26/17 3299   Current Outpatient Medications  Medication Sig Dispense Refill  . busPIRone (BUSPAR) 15 MG tablet Take 1 tablet (15 mg total) by mouth 2 (two) times daily. For anxiety (Patient taking differently: Take 15 mg by mouth 3 (three) times daily. For anxiety) 60 tablet 0  . DULoxetine (CYMBALTA) 20 MG capsule Take 40 mg by mouth 2 (two) times daily.     Marland Kitchen etonogestrel (NEXPLANON) 68 MG IMPL implant 1 each by Subdermal route once. Implanted Summer 2018    . fluvoxaMINE (LUVOX) 100 MG tablet Take 100 mg by mouth 2 (  two) times daily.  2  . glipiZIDE (GLUCOTROL) 10 MG tablet Take 10 mg by mouth 2 (two) times daily before a meal.    . hydrochlorothiazide (HYDRODIURIL) 25 MG tablet Take 25 mg by mouth daily.    . Insulin Detemir (LEVEMIR FLEXTOUCH) 100 UNIT/ML Pen Inject 40 Units into the skin 2 (two) times daily after a meal.    . paliperidone (INVEGA SUSTENNA) 234 MG/1.5ML SUSP injection Inject 234 mg every 30 (thirty) days into the muscle.    . fluvoxaMINE (LUVOX) 50 MG tablet Take 1 tablet (50 mg total) by mouth at bedtime. (Patient not taking: Reported on 10/25/2017) 30 tablet 0  . paliperidone (INVEGA) 3 MG 24 hr tablet Take 1 tablet (3 mg total) by mouth daily. (Patient not taking: Reported on 10/25/2017) 30 tablet 0    Musculoskeletal: Strength & Muscle Tone: within normal limits Gait & Station: normal Patient leans: N/A  Psychiatric Specialty Exam: Physical Exam   Nursing note and vitals reviewed. Constitutional: She is oriented to person, place, and time. She appears well-developed and well-nourished.  HENT:  Head: Normocephalic.  Neck: Normal range of motion.  Respiratory: Effort normal.  Musculoskeletal: Normal range of motion.  Neurological: She is alert and oriented to person, place, and time.  Psychiatric: Her speech is normal and behavior is normal. Judgment and thought content normal. Her mood appears anxious. Her affect is blunt. Cognition and memory are normal. She exhibits a depressed mood.    Review of Systems  Constitutional: Positive for malaise/fatigue.  Psychiatric/Behavioral: Positive for depression. The patient is nervous/anxious.   All other systems reviewed and are negative.   Blood pressure 108/65, pulse 78, temperature 98.6 F (37 C), temperature source Oral, resp. rate 20, height '5\' 7"'  (1.702 m), weight 113.4 kg (250 lb), SpO2 100 %.Body mass index is 39.16 kg/m.  General Appearance: Casual  Eye Contact:  Good  Speech:  Normal Rate  Volume:  Normal  Mood:  Anxious and Depressed  Affect:  Blunt  Thought Process:  Coherent and Descriptions of Associations: Intact  Orientation:  Full (Time, Place, and Person)  Thought Content:  WDL and Logical  Suicidal Thoughts:  No  Homicidal Thoughts:  No  Memory:  Immediate;   Good Recent;   Good Remote;   Good  Judgement:  Fair  Insight:  Fair  Psychomotor Activity:  Decreased  Concentration:  Concentration: Good and Attention Span: Good  Recall:  Good  Fund of Knowledge:  Fair  Language:  Good  Akathisia:  No  Handed:  Right  AIMS (if indicated):   N/A  Assets:  Housing Leisure Time Resilience Social Support  ADL's:  Intact  Cognition:  Impaired,  Mild  Sleep:   N/A     Treatment Plan Summary: Discharge home to [facility] team ([facility]). Patient may benefit from day program services to help with lonliness and boredom. She has a history of chronic suicidal thoughts and does  not have an active plan at this time. Patient is observed resting comfortably and willing to discharge back home today.   Disposition: No evidence of imminent risk to self or others at present.    Nanci Pina, FNP 10/26/2017 12:05 PM

## 2017-10-27 DIAGNOSIS — F330 Major depressive disorder, recurrent, mild: Secondary | ICD-10-CM

## 2017-10-27 NOTE — BH Assessment (Signed)
[facility] Assessment Progress Note  Per Buford Dresser, DO, this pt does not require psychiatric hospitalization at this time.  Pt is to discharged from [facility] with recommendation to continue treatment with the [facility].  This has been included in pt's discharge instructions.  At 10:50 this Probation officer called the [facility] ACT Team and spoke to [employer].  She agrees to have a clinician come to [facility] by 14:00 to pick pt up.  Pt's nurse, Caryl Pina, has been notified.  Jalene Mullet, [place] Triage Specialist [phone]

## 2017-10-27 NOTE — BH Assessment (Addendum)
Assessment Note  [name] is an 27 y.o. female, who presents voluntary and unaccompanied. Per chart, pt has 29 ED visits in the past six months, for similar presentations. Clinician asked the pt, "what brought you to the hospital?" Pt reported, "my medicine is not working." Pt reported, she came to [facility] for a medication change. Per chart, pt was assessed at [facility], yesterday for suicide ideations. Pt reported, she was discharged, to her ACT Team, and they added Abilify to her medication regimen. Pt reported, she took Cymbalta to kill herself. Pt denies, HI, AVH, self-injurious behaviors and access to weapons.   Pt denies abuse and substance use. Pt's UDS is negative. Pt is linked to [facility] for medication management and counseling. Pt reported, previous inpatient admissions.   Pt presents quiet/awake in scrubs with logical/coherent speech. Pt's mood was depressed,sad, helpless. Pt's affect is congruent with mood. Pt's thought process was coherent/relevant. Pt's judgement is impaired. Pt was oriented x4. Pt's concentration was normal. Pt's insight and impulse control are poor. Pt reported, if discharged from [facility] she could not contract for safety. Pt reported, if inpatient treatment is recommended she would sign-in voluntarily.   Diagnosis: F33.2 Major Depressive Disorder, recurrent, severe without psychotic features.   Past Medical History:  Past Medical History:  Diagnosis Date  . Anxiety   . Bipolar 1 disorder ([place])   . Cognitive deficits   . Depression   . Diabetes mellitus without complication ([place])   . Hypertension   . Mental disorder   . Obesity     Past Surgical History:  Procedure Laterality Date  . CESAREAN SECTION    . CESAREAN SECTION N/A 04/25/2013   Procedure: REPEAT CESAREAN SECTION;  Surgeon: [name], MD;  Location: [facility];  Service: Obstetrics;  Laterality: N/A;  . MASS EXCISION N/A 06/03/2012   Procedure: EXCISION MASS;  Surgeon: [name], MD;   Location: [place];  Service: ENT;  Laterality: N/A;  Excision uvula mass  . TONSILLECTOMY N/A 06/03/2012   Procedure: TONSILLECTOMY;  Surgeon: [name], MD;  Location: [place];  Service: ENT;  Laterality: N/A;  . TONSILLECTOMY      Family History:  Family History  Problem Relation Age of Onset  . Hypertension Mother   . Diabetes Father     Social History:  reports that she has been smoking cigarettes.  She has a 33.00 pack-year smoking history. She has never used smokeless tobacco. She reports that she does not drink alcohol or use drugs.  Additional Social History:  Alcohol / Drug Use Pain Medications: See MAR Prescriptions: See MAR Over the Counter: See MAR History of alcohol / drug use?: No history of alcohol / drug abuse  CIWA: CIWA-Ar BP: 102/60 Pulse Rate: 93 COWS:    Allergies:  Allergies  Allergen Reactions  . Wellbutrin [Bupropion] Shortness Of Breath  . Omnipaque [Iohexol] Swelling and Other (See Comments)    Reaction:  Eye swelling  . Penicillins Hives and Other (See Comments)    Has patient had a PCN reaction causing immediate rash, facial/tongue/throat swelling, SOB or lightheadedness with hypotension: No Has patient had a PCN reaction causing severe rash involving mucus membranes or skin necrosis: No Has patient had a PCN reaction that required hospitalization No Has patient had a PCN reaction occurring within the last 10 years: No If all of the above answers are "NO", then may proceed with Cephalosporin use.  . Depakote Er [Divalproex Sodium Er] Nausea And  Vomiting    Home Medications:  (Not in a hospital admission)  OB/GYN Status:  No LMP recorded. Patient has had an implant.  General Assessment Data Location of Assessment: [facility] TTS Assessment: In system Is this a Tele or Face-to-Face Assessment?: Face-to-Face Is this an Initial Assessment or a Re-assessment for this encounter?: Initial Assessment Marital  status: Single Is patient pregnant?: No Pregnancy Status: No Living Arrangements: Alone Can pt return to current living arrangement?: Yes Admission Status: Voluntary Is patient capable of signing voluntary admission?: Yes Referral Source: Self/Family/Friend Insurance type: Medicaid     Crisis Care Plan Living Arrangements: Alone Legal Guardian: Other:(Self.) Name of Psychiatrist: [facility]  Name of Therapist: [facility].  Education Status Is patient currently in school?: No Highest grade of school patient has completed: Per chart, 11th grade. Is the patient employed, unemployed or receiving disability?: Receiving disability income  Risk to self with the past 6 months Suicidal Ideation: Yes-Currently Present Has patient been a risk to self within the past 6 months prior to admission? : Yes Suicidal Intent: Yes-Currently Present Has patient had any suicidal intent within the past 6 months prior to admission? : Yes Is patient at risk for suicide?: Yes Suicidal Plan?: Yes-Currently Present Has patient had any suicidal plan within the past 6 months prior to admission? : Yes Specify Current Suicidal Plan: Pt overdosed on Cymbalta.  Access to Means: Yes Specify Access to Suicidal Means: Pt has access to medications.  What has been your use of drugs/alcohol within the last 12 months?: Pt denies.  Previous Attempts/Gestures: Yes How many times?: 6 Other Self Harm Risks: Pt denies. Triggers for Past Attempts: Unpredictable Intentional Self Injurious Behavior: Cutting Comment - Self Injurious Behavior: Per chart, pt cut in the past.  Family Suicide History: No Recent stressful life event(s): Conflict (Comment)(with mother, thinks her mother is mean to her. ) Persecutory voices/beliefs?: No Depression: Yes Depression Symptoms: Feeling angry/irritable, Feeling worthless/self pity, Loss of interest in usual pleasures, Guilt, Fatigue, Isolating, Tearfulness, Insomnia,  Despondent Substance abuse history and/or treatment for substance abuse?: No Suicide prevention information given to non-admitted patients: Not applicable  Risk to Others within the past 6 months Homicidal Ideation: No(Pt denies.) Does patient have any lifetime risk of violence toward others beyond the six months prior to admission? : No(Pt denies.) Thoughts of Harm to Others: No(Pt denies.) Current Homicidal Intent: No(Pt denies.) Current Homicidal Plan: No(Pt denies.) Access to Homicidal Means: No(Pt denies.) Identified Victim: NA History of harm to others?: No(Pt denies.) Assessment of Violence: None Noted(Pt denies.) Violent Behavior Description: NA Does patient have access to weapons?: No(Pt denies.) Criminal Charges Pending?: No Does patient have a court date: No Is patient on probation?: No  Psychosis Hallucinations: None noted Delusions: None noted  Mental Status Report Appearance/Hygiene: In scrubs Eye Contact: Good Motor Activity: Unremarkable Speech: Logical/coherent Level of Consciousness: Quiet/awake Mood: Depressed, Sad, Helpless Affect: Other (Comment)(congruent with mood. ) Anxiety Level: None Thought Processes: Coherent, Relevant Judgement: Impaired Orientation: Person, Place, Time, Situation Obsessive Compulsive Thoughts/Behaviors: None  Cognitive Functioning Concentration: Normal Memory: Recent Intact Is patient IDD: No Is patient DD?: Unknown Insight: Poor Impulse Control: Poor Appetite: Good Sleep: Decreased Total Hours of Sleep: 6 Vegetative Symptoms: Staying in bed  ADLScreening [facility] Assessment Services) Patient's cognitive ability adequate to safely complete daily activities?: Yes Patient able to express need for assistance with ADLs?: Yes Independently performs ADLs?: Yes (appropriate for developmental age)  Prior Inpatient Therapy Prior Inpatient Therapy: Yes Prior  Therapy Dates: Per chart, multiple. Prior Therapy  Facilty/Provider(s): Per chart, [facility], [facility], [facility]. Reason for Treatment: SI, suicide attempt.   Prior Outpatient Therapy Prior Outpatient Therapy: Yes Prior Therapy Dates: Current Prior Therapy Facilty/Provider(s): [facility]. Reason for Treatment: Medication management and counseling.  Does patient have an ACCT team?: Yes Does patient have Intensive In-House Services?  : No Does patient have [facility] services? : Yes Does patient have P4CC services?: No  ADL Screening (condition at time of admission) Patient's cognitive ability adequate to safely complete daily activities?: Yes Is the patient deaf or have difficulty hearing?: No Does the patient have difficulty seeing, even when wearing glasses/contacts?: No Does the patient have difficulty concentrating, remembering, or making decisions?: No Patient able to express need for assistance with ADLs?: Yes Does the patient have difficulty dressing or bathing?: No Independently performs ADLs?: Yes (appropriate for developmental age) Does the patient have difficulty walking or climbing stairs?: No Weakness of Legs: None Weakness of Arms/Hands: None  Home Assistive Devices/Equipment Home Assistive Devices/Equipment: None    Abuse/Neglect Assessment (Assessment to be complete while patient is alone) Abuse/Neglect Assessment Can Be Completed: Yes Physical Abuse: Denies(Pt denies.) Verbal Abuse: Denies(Pt denies. ) Sexual Abuse: Denies(Pt denies. ) Exploitation of patient/patient's resources: Denies(Pt denies. ) Self-Neglect: Denies(Pt denies. )     Advance Directives (For Healthcare) Does Patient Have a Medical Advance Directive?: No Would patient like information on creating a medical advance directive?: No - Patient declined    Additional Information 1:1 In Past 12 Months?: No CIRT Risk: No Elopement Risk: No Does patient have medical clearance?: Yes     Disposition: Lindon Romp, NP recommends overnight  observation for safety and stabilization. Disposition discussed with Dr. Stark Jock and Karna Christmas, Charge Nurse.   Disposition Initial Assessment Completed for this Encounter: Yes Disposition of Patient: (overnight observation for safety and stabilization.) Patient refused recommended treatment: No Mode of transportation if patient is discharged?: N/A  On Site Evaluation by: Vertell Novak, MS, LPC, CRC. Reviewed with Physician: Dr. Stark Jock and Lindon Romp, NP.    Vertell Novak 10/27/2017 4:39 AM   Vertell Novak, MS, [facility], [facility] Triage Specialist [phone]

## 2017-10-27 NOTE — Progress Notes (Addendum)
Inpatient Diabetes Program Recommendations  AACE/ADA: New Consensus Statement on Inpatient Glycemic Control (2015)  Target Ranges:  Prepandial:   less than 140 mg/dL      Peak postprandial:   less than 180 mg/dL (1-2 hours)      Critically ill patients:  140 - 180 mg/dL   Results for IOANNA, COLQUHOUN A (MRN [id number]) as of 10/27/2017 09:42  Ref. Range 10/26/2017 07:07 10/26/2017 21:52  Glucose-Capillary Latest Ref Range: 70 - 99 mg/dL 208 (H) 196 (H)    To ED with: Suicidal/ Homicidal Ideations  History: DM, Anxiety, Bipolar  Home DM Meds: Levemir 40 units BID       Glipizide 10 mg BID  Current Insulin Orders: None     MD- While patient being cared for in the ED, please consider the following to help manage blood sugars:  1. Start Novolog Moderate Correction Scale/ SSI (0-15 units) TID AC + HS  (Use Glycemic Control Order set)   2. Start Levemir 20 units BID (50% total home dose)     --Will follow patient during hospitalization--  Wyn Quaker RN, MSN, CDE Diabetes Coordinator Inpatient Glycemic Control Team Team Pager: [phone] (8a-5p)

## 2017-10-27 NOTE — ED Provider Notes (Signed)
Patient's care signed out at shift change.  She is being observed for an overdose of her psychiatric medications.  She has had no ectopy or arrhythmia.  She appears medically clear for consultation by TTS.   Veryl Speak, MD 10/27/17 [phone]

## 2017-10-27 NOTE — ED Notes (Signed)
Bed: GNF62 Expected date:  Expected time:  Means of arrival:  Comments: Room 17

## 2017-10-27 NOTE — [facility] Suicide Risk Assessment (Cosign Needed)
Suicide Risk Assessment  Discharge Assessment   [facility] Discharge Suicide Risk Assessment   Principal Problem: Major depressive disorder, recurrent episode, mild ([name]) Discharge Diagnoses:  Patient Active Problem List   Diagnosis Date Noted  . Psychosis ([place]) [F29]   . Adjustment disorder with mixed disturbance of emotions and conduct [F43.25] 08/03/2017  . LGSIL on Pap smear of cervix [R87.612] 02/01/2017  . OCD (obsessive compulsive disorder) [F42.9] 10/05/2016  . Major depressive disorder, recurrent episode, mild ([name]) [F33.0] 05/04/2016  . 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 Learning disability [F81.9] 07/18/2015  . 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 Diabetes mellitus ([place]) [E11.9] 07/18/2015  . MDD (major depressive disorder), recurrent, severe, with psychosis ([place]) [F33.3] 07/18/2015  . Hyperlipidemia [E78.5] 07/18/2015  . Severe episode of recurrent major depressive disorder, without psychotic features ([place]) [F33.2]   . Drug overdose [T50.901A]   . Cognitive deficits [R41.89] 10/12/2012  . Generalized anxiety disorder [F41.1] 06/28/2012   Pt was seen and chart reviewed with treatment team and Dr Mariea Clonts.Pt is well known to this ED and has had 30 visits in the past 6 months. Pt presents claiming she tried to commit suicide by overdosing on (7) Cymbalta  and she wants to live in a group home. Pt stated she lives alone but does not want to.  Pt has a Hx of IDD but IQ is not low enough, per [facility] requirements,  to receive the type of services she is requesting. Pt has community support through [facility]. Pt's UDS and BAL were negative. Pt is chronically suicidal and has a high degree of secondary gain by seeking companionship in the emergency room. Pt is stable and psychiatrically clear for discharge.   Total Time spent with patient: 30 minutes  Musculoskeletal: Strength & Muscle Tone: within normal limits Gait & Station: normal Patient leans:  N/A  Psychiatric Specialty Exam:   Blood pressure 102/60, pulse 93, temperature 98.3 F (36.8 C), temperature source Oral, resp. rate 18, height 5\' 7"  (1.702 m), weight 113.4 kg (250 lb), SpO2 97 %.Body mass index is 39.16 kg/m.  General Appearance: Casual  Eye Contact::  Fair  Speech:  Clear and Coherent and Normal Rate  Volume:  Normal  Mood:  Depressed  Affect:  Congruent and Depressed  Thought Process:  Coherent, Goal Directed and Linear  Orientation:  Full (Time, Place, and Person)  Thought Content:  Logical  Suicidal Thoughts:  No  Homicidal Thoughts:  No  Memory:  Immediate;   Good Recent;   Good Remote;   Fair  Judgement:  Poor  Insight:  Shallow  Psychomotor Activity:  Normal  Concentration:  Good  Recall:  Good  Fund of Knowledge:Good  Language: Good  Akathisia:  No  Handed:  Right  AIMS (if indicated):     Assets:  Agricultural consultant Housing Social Support  Sleep:     Cognition: WNL  ADL's:  Intact   Mental Status Per Nursing Assessment::   On Admission:   chronic suicidal ideation  Demographic Factors:  Adolescent or young adult, Low socioeconomic status and Living alone  Loss Factors: Financial problems/change in socioeconomic status  Historical Factors: Impulsivity  Risk Reduction Factors:   Sense of responsibility to family  Continued Clinical Symptoms:  Severe Anxiety and/or Agitation Depression:   Impulsivity  Cognitive Features That Contribute To Risk:  Closed-mindedness    Suicide Risk:  Minimal: No identifiable suicidal ideation.  Patients presenting with no risk factors but with morbid ruminations; may be  classified as minimal risk based on the severity of the depressive symptoms    Plan Of Care/Follow-up recommendations:  Activity:  as tolerated  Diet:  Heart healthy  Nanci Pina, FNP 10/27/2017, 12:41 PM

## 2017-10-27 NOTE — Consult Note (Addendum)
[facility] Consult   Reason for Consult:  Suicidal ideations Referring Physician:  EDP Patient Identification: [name] MRN:  [id number] Principal Diagnosis: Major depressive disorder, recurrent episode, mild ([place]) Diagnosis:   Patient Active Problem List   Diagnosis Date Noted  . Psychosis ([place]) [F29]   . Adjustment disorder with mixed disturbance of emotions and conduct [F43.25] 08/03/2017  . LGSIL on Pap smear of cervix [R87.612] 02/01/2017  . OCD (obsessive compulsive disorder) [F42.9] 10/05/2016  . Major depressive disorder, recurrent episode, mild ([place]) [F33.0] 05/04/2016  . 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 Learning disability [F81.9] 07/18/2015  . 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 Diabetes mellitus ([place]) [E11.9] 07/18/2015  . MDD (major depressive disorder), recurrent, severe, with psychosis ([place]) [F33.3] 07/18/2015  . Hyperlipidemia [E78.5] 07/18/2015  . Severe episode of recurrent major depressive disorder, without psychotic features ([place]) [F33.2]   . Drug overdose [T50.901A]   . Cognitive deficits [R41.89] 10/12/2012  . Generalized anxiety disorder [F41.1] 06/28/2012    Total Time spent with patient: 30 minutes  Subjective:   [name] is a 27 y.o. female patient admitted does not warrant admission. I was bored and went to my moms house but she wouldn't let me in. My ACTT team started me on Abilify yesterday. I took the Cymbalta about 8pm last night and came into the hospital.   HPI: [name] is an 27 y.o. female .who presents voluntarily to [facility] after reports of overdosing on (7) 20 mg Cymbalta.  She continues to report symptoms of depression and suicidal ideation due to boredom. " I went to moms house and she would not let me in, it hurt my feelings.  "  Pt has a history of suicidal ideation and depression.  Pt has an extensive mental health history and has presented to emergency departments 30 times  in the past 6 months with most recent visit being yesterday.  Pt reports 5 or 6 past attempts. Pt acknowledges symptoms including: boredom, isolation, and sadness.  Pt denies homicidal ideation/ history of violence. Pt denies auditory or visual hallucinations or other psychotic symptoms. Pt states current stressors includes being under a lot of stress, her medications not working, feeling like killing herself, not liking her apartment or neighborhood.   Pt lives alone and denies having any supports.   Pt denies hstory of abuse and trauma. Pt denies family history of SI/MH/SA. Pt is unemployed and receiving disability. Pt has poor insight and poor judgment. Pt's memory is intact.  Pt denies legal history.  Pt's OP history includes currently receiving services from [facility] and [facility]. IP history includes most recent hospitalization last year at [facility].  [facility] contacted, stable for discharge.   Past Psychiatric History:   Depression, OCD, IDD,   Risk to Self: None Risk to Others: Homicidal Ideation: No(Pt denies.) Thoughts of Harm to Others: No(Pt denies.) Current Homicidal Intent: No(Pt denies.) Current Homicidal Plan: No(Pt denies.) Access to Homicidal Means: No(Pt denies.) Identified Victim: NA History of harm to others?: No(Pt denies.) Assessment of Violence: None Noted(Pt denies.) Violent Behavior Description: NA Does patient have access to weapons?: No(Pt denies.) Criminal Charges Pending?: No Does patient have a court date: No Prior Inpatient Therapy: Prior Inpatient Therapy: Yes Prior Therapy Dates: Per chart, multiple. Prior Therapy Facilty/Provider(s): Per chart, [facility], [facility], [facility]. Reason for Treatment: SI, suicide attempt.  Prior Outpatient Therapy: Prior Outpatient Therapy: Yes Prior Therapy Dates: Current Prior Therapy Facilty/Provider(s): [facility]. Reason  for Treatment: Medication management and counseling.  Does patient have an ACCT team?: Yes Does  patient have Intensive In-House Services?  : No Does patient have [facility] services? : Yes Does patient have P4CC services?: No  Past Medical History:  Past Medical History:  Diagnosis Date  . Anxiety   . Bipolar 1 disorder ([place])   . Cognitive deficits   . Depression   . Diabetes mellitus without complication ([place])   . Hypertension   . Mental disorder   . Obesity     Past Surgical History:  Procedure Laterality Date  . CESAREAN SECTION    . CESAREAN SECTION N/A 04/25/2013   Procedure: REPEAT CESAREAN SECTION;  Surgeon: [name], MD;  Location: [facility];  Service: Obstetrics;  Laterality: N/A;  . MASS EXCISION N/A 06/03/2012   Procedure: EXCISION MASS;  Surgeon: [name], MD;  Location: [facility];  Service: ENT;  Laterality: N/A;  Excision uvula mass  . TONSILLECTOMY N/A 06/03/2012   Procedure: TONSILLECTOMY;  Surgeon: [name], MD;  Location: [facility];  Service: ENT;  Laterality: N/A;  . TONSILLECTOMY     Family History:  Family History  Problem Relation Age of Onset  . Hypertension Mother   . Diabetes Father    Family Psychiatric  History: none Social History:  Social History   Substance and Sexual Activity  Alcohol Use No     Social History   Substance and Sexual Activity  Drug Use No   Comment: Patient denies    Social History   Socioeconomic History  . Marital status: Single    Spouse name: Not on file  . Number of children: Not on file  . Years of education: Not on file  . Highest education level: Not on file  Occupational History  . Not on file  Social Needs  . Financial resource strain: Not on file  . Food insecurity:    Worry: Not on file    Inability: Not on file  . Transportation needs:    Medical: Not on file    Non-medical: Not on file  Tobacco Use  . Smoking status: Current Every Day Smoker    Packs/day: 3.00    Years: 11.00    Pack years: 33.00    Types: Cigarettes  . Smokeless tobacco:  Never Used  Substance and Sexual Activity  . Alcohol use: No  . Drug use: No    Comment: Patient denies  . Sexual activity: Yes    Birth control/protection: Implant  Lifestyle  . Physical activity:    Days per week: Not on file    Minutes per session: Not on file  . Stress: Not on file  Relationships  . Social connections:    Talks on phone: Not on file    Gets together: Not on file    Attends religious service: Not on file    Active member of club or organization: Not on file    Attends meetings of clubs or organizations: Not on file    Relationship status: Not on file  Other Topics Concern  . Not on file  Social History Narrative  . Not on file   Additional Social History: N/A    Allergies:   Allergies  Allergen Reactions  . Wellbutrin [Bupropion] Shortness Of Breath  . Omnipaque [Iohexol] Swelling and Other (See Comments)    Reaction:  Eye swelling  . Penicillins Hives and Other (See Comments)    Has patient had a PCN  reaction causing immediate rash, facial/tongue/throat swelling, SOB or lightheadedness with hypotension: No Has patient had a PCN reaction causing severe rash involving mucus membranes or skin necrosis: No Has patient had a PCN reaction that required hospitalization No Has patient had a PCN reaction occurring within the last 10 years: No If all of the above answers are "NO", then may proceed with Cephalosporin use.  . Depakote Er [Divalproex Sodium Er] Nausea And Vomiting    Labs:  Results for orders placed or performed during the hospital encounter of 10/26/17 (from the past 48 hour(s))  Rapid urine drug screen (hospital performed)     Status: Abnormal   Collection Time: 10/26/17  9:48 PM  Result Value Ref Range   Opiates NONE DETECTED NONE DETECTED   Cocaine NONE DETECTED NONE DETECTED   Benzodiazepines NONE DETECTED NONE DETECTED   Amphetamines NONE DETECTED NONE DETECTED   Tetrahydrocannabinol NONE DETECTED NONE DETECTED   Barbiturates (A)  NONE DETECTED    Result not available. Reagent lot number recalled by manufacturer.    Comment: Performed at [facility], [place] [address] [place], [place] [address]  CBG monitoring, ED     Status: Abnormal   Collection Time: 10/26/17  9:52 PM  Result Value Ref Range   Glucose-Capillary 196 (H) 70 - 99 mg/dL  Comprehensive metabolic panel     Status: Abnormal   Collection Time: 10/26/17  9:59 PM  Result Value Ref Range   Sodium 141 135 - 145 mmol/L   Potassium 3.3 (L) 3.5 - 5.1 mmol/L   Chloride 104 98 - 111 mmol/L    Comment: Please note change in reference range.   CO2 27 22 - 32 mmol/L   Glucose, Bld 186 (H) 70 - 99 mg/dL    Comment: Please note change in reference range.   BUN 11 6 - 20 mg/dL    Comment: Please note change in reference range.   Creatinine, Ser 0.89 0.44 - 1.00 mg/dL   Calcium 9.1 8.9 - 10.3 mg/dL   Total Protein 7.3 6.5 - 8.1 g/dL   Albumin 4.0 3.5 - 5.0 g/dL   AST 34 15 - 41 U/L   ALT 24 0 - 44 U/L    Comment: Please note change in reference range.   Alkaline Phosphatase 45 38 - 126 U/L   Total Bilirubin 0.3 0.3 - 1.2 mg/dL   GFR calc non Af Amer >60 >60 mL/min   GFR calc Af Amer >60 >60 mL/min    Comment: (NOTE) The eGFR has been calculated using the CKD EPI equation. This calculation has not been validated in all clinical situations. eGFR's persistently <60 mL/min signify possible Chronic Kidney Disease.    Anion gap 10 5 - 15    Comment: Performed at [facility], [place] [address], [place], [place] [address]  Ethanol     Status: None   Collection Time: 10/26/17  9:59 PM  Result Value Ref Range   Alcohol, Ethyl (B) <10 <10 mg/dL    Comment: (NOTE) Lowest detectable limit for serum alcohol is 10 mg/dL. For medical purposes only. Performed at [facility], [place] [address] [place], [place] [address]   Salicylate level     Status: None   Collection Time: 10/26/17  9:59 PM  Result Value Ref  Range   Salicylate Lvl <9.5 2.8 - 30.0 mg/dL    Comment: Performed at [facility], [place] [address] [place], [place] [address]  Acetaminophen level  Status: Abnormal   Collection Time: 10/26/17  9:59 PM  Result Value Ref Range   Acetaminophen (Tylenol), Serum <10 (L) 10 - 30 ug/mL    Comment: (NOTE) Therapeutic concentrations vary significantly. A range of 10-30 ug/mL  may be an effective concentration for many patients. However, some  are best treated at concentrations outside of this range. Acetaminophen concentrations >150 ug/mL at 4 hours after ingestion  and >50 ug/mL at 12 hours after ingestion are often associated with  toxic reactions. Performed at [facility], [place] [address] [place], [place] [address]   cbc     Status: None   Collection Time: 10/26/17  9:59 PM  Result Value Ref Range   WBC 9.6 4.0 - 10.5 K/uL   RBC 4.70 3.87 - 5.11 MIL/uL   Hemoglobin 12.8 12.0 - 15.0 g/dL   HCT 39.2 36.0 - 46.0 %   MCV 83.4 78.0 - 100.0 fL   MCH 27.2 26.0 - 34.0 pg   MCHC 32.7 30.0 - 36.0 g/dL   RDW 14.0 11.5 - 15.5 %   Platelets 245 150 - 400 K/uL    Comment: Performed at [facility], [place] [address], [place], [place] [address]  I-Stat beta hCG blood, ED     Status: None   Collection Time: 10/26/17 10:05 PM  Result Value Ref Range   I-stat hCG, quantitative <5.0 <5 mIU/mL   Comment 3            Comment:   GEST. AGE      CONC.  (mIU/mL)   <=1 WEEK        5 - 50     2 WEEKS       50 - 500     3 WEEKS       100 - 10,000     4 WEEKS     1,000 - 30,000        FEMALE AND NON-PREGNANT FEMALE:     LESS THAN 5 mIU/mL     No current facility-administered medications for this encounter.    Current Outpatient Medications  Medication Sig Dispense Refill  . busPIRone (BUSPAR) 15 MG tablet Take 1 tablet (15 mg total) by mouth 2 (two) times daily. For anxiety (Patient taking differently: Take 15 mg by mouth 3 (three) times daily.  For anxiety) 60 tablet 0  . DULoxetine (CYMBALTA) 20 MG capsule Take 40 mg by mouth 2 (two) times daily.     Marland Kitchen etonogestrel (NEXPLANON) 68 MG IMPL implant 1 each by Subdermal route once. Implanted Summer 2018    . fluvoxaMINE (LUVOX) 100 MG tablet Take 100 mg by mouth 2 (two) times daily.  2  . glipiZIDE (GLUCOTROL) 10 MG tablet Take 10 mg by mouth 2 (two) times daily before a meal.    . hydrochlorothiazide (HYDRODIURIL) 25 MG tablet Take 25 mg by mouth daily.    . Insulin Detemir (LEVEMIR FLEXTOUCH) 100 UNIT/ML Pen Inject 40 Units into the skin 2 (two) times daily after a meal.    . paliperidone (INVEGA SUSTENNA) 234 MG/1.5ML SUSP injection Inject 234 mg every 30 (thirty) days into the muscle.    . fluvoxaMINE (LUVOX) 50 MG tablet Take 1 tablet (50 mg total) by mouth at bedtime. (Patient not taking: Reported on 10/26/2017) 30 tablet 0  . paliperidone (INVEGA) 3 MG 24 hr tablet Take 1 tablet (3 mg total) by mouth daily. (Patient not taking: Reported on 10/25/2017) 30 tablet 0  Musculoskeletal: Strength & Muscle Tone: within normal limits Gait & Station: normal Patient leans: N/A  Psychiatric Specialty Exam: Physical Exam  Nursing note and vitals reviewed. Constitutional: She is oriented to person, place, and time. She appears well-developed and well-nourished.  HENT:  Head: Normocephalic and atraumatic.  Neck: Normal range of motion.  Respiratory: Effort normal.  Musculoskeletal: Normal range of motion.  Neurological: She is alert and oriented to person, place, and time.  Psychiatric: Her speech is normal and behavior is normal. Judgment and thought content normal. Her mood appears anxious. Her affect is blunt. Cognition and memory are normal. She exhibits a depressed mood.    Review of Systems  Constitutional: Positive for malaise/fatigue.  Psychiatric/Behavioral: Positive for depression. The patient is nervous/anxious.   All other systems reviewed and are negative.   Blood pressure  102/60, pulse 93, temperature 98.3 F (36.8 C), temperature source Oral, resp. rate 18, height '5\' 7"'  (1.702 m), weight 113.4 kg (250 lb), SpO2 97 %.Body mass index is 39.16 kg/m.  General Appearance: Casual  Eye Contact:  Good  Speech:  Normal Rate  Volume:  Normal  Mood:  Anxious and Depressed  Affect:  Blunt  Thought Process:  Coherent and Descriptions of Associations: Intact  Orientation:  Full (Time, Place, and Person)  Thought Content:  WDL and Logical  Suicidal Thoughts:  No  Homicidal Thoughts:  No  Memory:  Immediate;   Good Recent;   Good Remote;   Good  Judgement:  Fair  Insight:  Fair  Psychomotor Activity:  Decreased  Concentration:  Concentration: Good and Attention Span: Good  Recall:  Good  Fund of Knowledge:  Fair  Language:  Good  Akathisia:  No  Handed:  Right  AIMS (if indicated):   N/A  Assets:  Housing Leisure Time Resilience Social Support  ADL's:  Intact  Cognition:  Impaired,  Mild  Sleep:   N/A     Treatment Plan Summary: Discharge home to ACT team ([facility]).  Patient states her NP just started her on Abilify 5 mg po daily. Will continue with current medications. Patient may benefit from day program services to help with lonliness and boredom. She has a history of chronic suicidal thoughts and does not have an active plan at this time. She continues to ruminate about going to a group home. Will consider discussing with SW about placement in group home. Patient continues to return to ED numerous times despite having ACTT services.  Patient is observed resting comfortably and willing to discharge back home today.   Disposition: No evidence of imminent risk to self or others at present.  Discharge home to [facility].   Nanci Pina, FNP 10/27/2017 12:34 PM    Patient seen face-to-face for psychiatric evaluation, chart reviewed and case discussed with the physician extender and developed treatment plan. Reviewed the information documented and agree with the  treatment plan.  Buford Dresser, DO 10/27/17 12:55 PM

## 2017-10-27 NOTE — Discharge Instructions (Addendum)
For your behavioral health needs, you are advised to continue treatment with the [facility] Team: ° °     [facility] ACT Team °     [facility], [address] °     [address] °     [place], [place]  [address] °     [phone] °     Crisis number: [phone] °

## 2017-10-27 NOTE — ED Notes (Signed)
Patient currently denies SI/HI/AVH. Plan of care discussed. Encouragement and support provided and safety maintain. Q 15 min safety checks in place video monitoring.

## 2017-10-29 ENCOUNTER — Emergency Department (HOSPITAL_COMMUNITY)
Admission: EM | Admit: 2017-10-29 | Discharge: 2017-10-30 | Disposition: A | Discharge status: Home / Self Care | Payer: Medicaid Other | Attending: Emergency Medicine | Admitting: Emergency Medicine

## 2017-10-29 ENCOUNTER — Encounter (HOSPITAL_COMMUNITY): Payer: Self-pay | Admitting: Emergency Medicine

## 2017-10-29 DIAGNOSIS — F17210 Nicotine dependence, cigarettes, uncomplicated: Secondary | ICD-10-CM | POA: Insufficient documentation

## 2017-10-29 DIAGNOSIS — E876 Hypokalemia: Secondary | ICD-10-CM | POA: Insufficient documentation

## 2017-10-29 DIAGNOSIS — Z046 Encounter for general psychiatric examination, requested by authority: Secondary | ICD-10-CM | POA: Insufficient documentation

## 2017-10-29 DIAGNOSIS — E119 Type 2 diabetes mellitus without complications: Secondary | ICD-10-CM | POA: Diagnosis not present

## 2017-10-29 DIAGNOSIS — Z79899 Other long term (current) drug therapy: Secondary | ICD-10-CM | POA: Diagnosis not present

## 2017-10-29 DIAGNOSIS — I10 Essential (primary) hypertension: Secondary | ICD-10-CM | POA: Diagnosis not present

## 2017-10-29 DIAGNOSIS — F329 Major depressive disorder, single episode, unspecified: Secondary | ICD-10-CM | POA: Insufficient documentation

## 2017-10-29 DIAGNOSIS — R45851 Suicidal ideations: Secondary | ICD-10-CM | POA: Diagnosis not present

## 2017-10-29 DIAGNOSIS — Z794 Long term (current) use of insulin: Secondary | ICD-10-CM | POA: Diagnosis not present

## 2017-10-29 LAB — COMPREHENSIVE METABOLIC PANEL
ALBUMIN: 4.3 g/dL (ref 3.5–5.0)
ALT: 28 U/L (ref 0–44)
AST: 39 U/L (ref 15–41)
Alkaline Phosphatase: 45 U/L (ref 38–126)
Anion gap: 12 (ref 5–15)
BUN: 9 mg/dL (ref 6–20)
CHLORIDE: 100 mmol/L (ref 98–111)
CO2: 27 mmol/L (ref 22–32)
CREATININE: 0.95 mg/dL (ref 0.44–1.00)
Calcium: 9.8 mg/dL (ref 8.9–10.3)
GFR calc Af Amer: >60 mL/min (ref >60)
GFR calc non Af Amer: >60 mL/min (ref >60)
Glucose, Bld: 203 mg/dL — ABNORMAL HIGH (ref 70–99)
POTASSIUM: 2.8 mmol/L — AB (ref 3.5–5.1)
Sodium: 139 mmol/L (ref 135–145)
Total Bilirubin: 0.8 mg/dL (ref 0.3–1.2)
Total Protein: 7.4 g/dL (ref 6.5–8.1)

## 2017-10-29 LAB — SALICYLATE LEVEL: Salicylate Lvl: <7 mg/dL (ref 2.8–30.0)

## 2017-10-29 LAB — CBC
HEMATOCRIT: 42.6 % (ref 36.0–46.0)
HEMOGLOBIN: 13.9 g/dL (ref 12.0–15.0)
MCH: 26.7 pg (ref 26.0–34.0)
MCHC: 32.6 g/dL (ref 30.0–36.0)
MCV: 81.9 fL (ref 78.0–100.0)
PLATELETS: 256 10*3/uL (ref 150–400)
RBC: 5.2 MIL/uL — AB (ref 3.87–5.11)
RDW: 13.8 % (ref 11.5–15.5)
WBC: 10.7 10*3/uL — AB (ref 4.0–10.5)

## 2017-10-29 LAB — RAPID URINE DRUG SCREEN, HOSP PERFORMED
AMPHETAMINES: NOT DETECTED
BENZODIAZEPINES: NOT DETECTED
Cocaine: NOT DETECTED
Opiates: NOT DETECTED
TETRAHYDROCANNABINOL: NOT DETECTED

## 2017-10-29 LAB — I-STAT BETA HCG BLOOD, ED (MC, WL, AP ONLY)

## 2017-10-29 LAB — ACETAMINOPHEN LEVEL: Acetaminophen (Tylenol), Serum: <10 ug/mL — ABNORMAL LOW (ref 10–30)

## 2017-10-29 LAB — ETHANOL

## 2017-10-29 MED ORDER — ONDANSETRON 4 MG PO TBDP
4.0000 mg | ORAL_TABLET | Freq: Once | ORAL | Status: AC
  Administered 2017-10-29: 4 mg via ORAL
  Filled 2017-10-29: qty 1

## 2017-10-29 NOTE — ED Triage Notes (Signed)
Pt transported from [employer] parking lot by [employer] for suicidal ideations. Pt reports to [employer] "she wants it all over".  Pt states she needs to be in a group home because she doesn't want to live alone but ACT not taking her seriously.  Pt reports plan to OD on her medication.  Pt reports nausea "because I smoke too many cigarettes"

## 2017-10-29 NOTE — ED Notes (Signed)
Pt given wine colored scrubs and socks to change into

## 2017-10-29 NOTE — ED Notes (Signed)
Pt belongings placed in locker 6 in pod F

## 2017-10-30 ENCOUNTER — Other Ambulatory Visit: Payer: Self-pay

## 2017-10-30 LAB — CBG MONITORING, ED: GLUCOSE-CAPILLARY: 210 mg/dL — AB (ref 70–99)

## 2017-10-30 MED ORDER — PALIPERIDONE PALMITATE ER 234 MG/1.5ML IM SUSY: 234.0000 mg | PREFILLED_SYRINGE | INTRAMUSCULAR | Status: DC

## 2017-10-30 MED ORDER — BUSPIRONE HCL 10 MG PO TABS
15.0000 mg | ORAL_TABLET | Freq: Two times a day (BID) | ORAL | Status: DC
  Administered 2017-10-30: 15 mg via ORAL
  Filled 2017-10-30: qty 2

## 2017-10-30 MED ORDER — ARIPIPRAZOLE 10 MG PO TABS
10.0000 mg | ORAL_TABLET | Freq: Every day | ORAL | Status: DC
  Administered 2017-10-30: 10 mg via ORAL
  Filled 2017-10-30: qty 1

## 2017-10-30 MED ORDER — HYDROCHLOROTHIAZIDE 25 MG PO TABS
25.0000 mg | ORAL_TABLET | Freq: Every day | ORAL | Status: DC
  Administered 2017-10-30: 25 mg via ORAL
  Filled 2017-10-30: qty 1

## 2017-10-30 MED ORDER — INSULIN DETEMIR 100 UNIT/ML ~~LOC~~ SOLN
40.0000 [IU] | Freq: Two times a day (BID) | SUBCUTANEOUS | Status: DC
  Administered 2017-10-30: 40 [IU] via SUBCUTANEOUS
  Filled 2017-10-30 (×3): qty 0.4

## 2017-10-30 MED ORDER — POTASSIUM CHLORIDE CRYS ER 20 MEQ PO TBCR
40.0000 meq | EXTENDED_RELEASE_TABLET | Freq: Two times a day (BID) | ORAL | Status: DC
  Administered 2017-10-30 (×2): 40 meq via ORAL
  Filled 2017-10-30 (×2): qty 2

## 2017-10-30 MED ORDER — GLIPIZIDE 10 MG PO TABS
10.0000 mg | ORAL_TABLET | Freq: Two times a day (BID) | ORAL | Status: DC
  Administered 2017-10-30: 10 mg via ORAL
  Filled 2017-10-30 (×2): qty 1

## 2017-10-30 MED ORDER — LORAZEPAM 1 MG PO TABS: 1.0000 mg | ORAL_TABLET | ORAL | Status: DC | PRN

## 2017-10-30 MED ORDER — DULOXETINE HCL 20 MG PO CPEP
20.0000 mg | ORAL_CAPSULE | Freq: Two times a day (BID) | ORAL | Status: DC
  Administered 2017-10-30: 20 mg via ORAL
  Filled 2017-10-30: qty 1

## 2017-10-30 NOTE — ED Notes (Signed)
Diabetic tray ordered

## 2017-10-30 NOTE — Progress Notes (Signed)
The [facility] Team called LCSW and notified that patient can be discharged after her assessment with patient. Currently, the [facility] ACT Team is at the patient's bedside.   Herbert Moors MSW, LCSW, LCAS Clinical Social Worker 10/30/2017 12:37 PM

## 2017-10-30 NOTE — Progress Notes (Signed)
LCSW called patient's [facility] Team at [phone] and notified them that patient is currently at [facility] ED. The person at [facility] reports that someone will come and assess her at 39 PM. LCSW has notified patient's nurse.   Herbert Moors MSW, LCSW, LCAS Clinical Social Worker 10/30/2017 10:06 AM

## 2017-10-30 NOTE — ED Notes (Signed)
Breakfast tray delivered to pt - pt noted to be sleeping.

## 2017-10-30 NOTE — ED Notes (Signed)
ALL belongings - 2 labeled belongings bags and 1 green backpack, 1 valuables envelope, and home meds - returned to pt - Pt signed verifying all items present.

## 2017-10-30 NOTE — Discharge Instructions (Addendum)
Please follow-up as per your ACT team

## 2017-10-30 NOTE — Progress Notes (Signed)
TTS consult ordered for pt who is currently in the hallway. Spoke with Seth Bake, RN who states she will call TTS at [phone] when the pt has been moved to a private room for the assessment.  Lind Covert, MSW, LCSW Therapeutic Triage Specialist  [phone]

## 2017-10-30 NOTE — ED Provider Notes (Signed)
Received call from [facility] Monday behavioral health nurse practitioner.  He has discussed patient's care with act team.  Act team has been in the ED to assess.  It is felt that patient is stable for discharge and they will increase her services as an outpatient.   Pattricia Boss, MD 10/30/17 1321

## 2017-10-30 NOTE — Progress Notes (Signed)
TTS consulted with [name], PA who recommends inpt treatment. EDP Charlann Lange, PA-C has been advised of the disposition. TTS to seek placement. No appropriate beds at [facility].  Lind Covert, MSW, LCSW Therapeutic Triage Specialist  [phone]

## 2017-10-30 NOTE — Progress Notes (Signed)
Patient is seen by me via tele-psych today and I have consulted with Dr. Dwyane Dee.  Patient denies any SI/HI/AVH and contracts for safety.  In the room with the patient is also Beverlee Nims from the [facility] ACT team.  Beverlee Nims states that they feel patient is safe to be discharged as well and they will be returning her to her home.  The act team will also increase her visits to daily and will include her in more groups in the coming week.  Beverlee Nims also has the patient's medications with her as there has been some medication changes by the ACT provider.  Patient reported feeling safe and ready to be discharged with Beverlee Nims.  I have contacted Dr. Jeanell Sparrow and notified her of the recommendation.  Patient is cleared psychiatrically and does not meet inpatient criteria.

## 2017-10-30 NOTE — ED Notes (Signed)
Pt declined snack offered. 

## 2017-10-30 NOTE — ED Provider Notes (Signed)
[facility] EMERGENCY DEPARTMENT Provider Note   CSN: [id number] Arrival date & time: 10/29/17  2059     History   Chief Complaint Chief Complaint  Patient presents with  . Suicidal    HPI [name] is a 27 y.o. female.  Patient returns to the emergency department for continuous thoughts of suicide. She denies self harm prior to arrival. No HI/hallucinations. She denies physical complaints.   The history is provided by the patient. No language interpreter was used.    Past Medical History:  Diagnosis Date  . Anxiety   . Bipolar 1 disorder ([place])   . Cognitive deficits   . Depression   . Diabetes mellitus without complication ([place])   . Hypertension   . Mental disorder   . Obesity     Patient Active Problem List   Diagnosis Date Noted  . Psychosis ([place])   . 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 LGSIL on Pap smear of cervix 02/01/2017  . OCD (obsessive compulsive disorder) 10/05/2016  . Major depressive disorder, recurrent episode, mild ([place]) 05/04/2016  . 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 Learning disability 07/18/2015  . 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 Diabetes mellitus ([place]) 07/18/2015  . 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 Hyperlipidemia 07/18/2015  . Severe episode of recurrent major depressive disorder, without psychotic features ([place])   . Drug overdose   . Cognitive deficits 10/12/2012  . Generalized anxiety disorder 06/28/2012    Past Surgical History:  Procedure Laterality Date  . CESAREAN SECTION    . CESAREAN SECTION N/A 04/25/2013   Procedure: REPEAT CESAREAN SECTION;  Surgeon: [name], MD;  Location: [facility];  Service: Obstetrics;  Laterality: N/A;  . MASS EXCISION N/A 06/03/2012   Procedure: EXCISION MASS;  Surgeon: [name], MD;  Location: [facility];  Service: ENT;  Laterality:  N/A;  Excision uvula mass  . TONSILLECTOMY N/A 06/03/2012   Procedure: TONSILLECTOMY;  Surgeon: [name], MD;  Location: [facility];  Service: ENT;  Laterality: N/A;  . TONSILLECTOMY       OB History    Gravida  3   Para  3   Term  3   Preterm  0   AB  0   Living  3     SAB  0   TAB  0   Ectopic  0   Multiple  0   Live Births  3            Home Medications    Prior to Admission medications   Medication Sig Start Date End Date Taking? Authorizing Provider  busPIRone (BUSPAR) 15 MG tablet Take 1 tablet (15 mg total) by mouth 2 (two) times daily. For anxiety Patient taking differently: Take 15 mg by mouth 3 (three) times daily. For anxiety 10/06/16   Lindell Spar I, NP  DULoxetine (CYMBALTA) 20 MG capsule Take 40 mg by mouth 2 (two) times daily.     [provider]  etonogestrel (NEXPLANON) 68 MG IMPL implant 1 each by Subdermal route once. Implanted Summer 2018    [provider]  fluvoxaMINE (LUVOX) 100 MG tablet Take 100 mg by mouth 2 (two) times daily. 10/18/17   [provider]  fluvoxaMINE (LUVOX) 50 MG tablet Take 1 tablet (50 mg total) by mouth at bedtime. Patient not taking: Reported on 10/26/2017 10/10/17   Patrecia Pour, NP  glipiZIDE (GLUCOTROL) 10 MG tablet Take 10 mg by mouth 2 (two) times daily before a meal.    [provider]  hydrochlorothiazide (HYDRODIURIL) 25 MG tablet Take 25 mg by mouth daily.    [provider]  Insulin Detemir (LEVEMIR FLEXTOUCH) 100 UNIT/ML Pen Inject 40 Units into the skin 2 (two) times daily after a meal.    [provider]  paliperidone (INVEGA SUSTENNA) 234 MG/1.5ML SUSP injection Inject 234 mg every 30 (thirty) days into the muscle.    [provider]  paliperidone (INVEGA) 3 MG 24 hr tablet Take 1 tablet (3 mg total) by mouth daily. Patient not taking: Reported on 10/25/2017 10/20/17   Patrecia Pour, NP    Family History Family History    Problem Relation Age of Onset  . Hypertension Mother   . Diabetes Father     Social History Social History   Tobacco Use  . Smoking status: Current Every Day Smoker    Packs/day: 3.00    Years: 11.00    Pack years: 33.00    Types: Cigarettes  . Smokeless tobacco: Never Used  Substance Use Topics  . Alcohol use: No  . Drug use: No    Comment: Patient denies     Allergies   Wellbutrin [bupropion]; Omnipaque [iohexol]; Penicillins; and Depakote er [divalproex sodium er]   Review of Systems Review of Systems  Constitutional: Negative for chills and fever.  HENT: Negative.   Respiratory: Negative.   Cardiovascular: Negative.   Gastrointestinal: Negative.   Musculoskeletal: Negative.   Skin: Negative.   Neurological: Negative.   Psychiatric/Behavioral: Positive for suicidal ideas. Negative for hallucinations and self-injury.     Physical Exam Updated Vital Signs BP (!) 133/98 (BP Location: Right Arm)   Pulse 89   Temp 98.8 F (37.1 C) (Oral)   Resp 16   Ht 5\' 7"  (1.702 m)   Wt 113.4 kg (250 lb)   SpO2 99%   BMI 39.16 kg/m   Physical Exam  Constitutional: She appears well-developed and well-nourished.  HENT:  Head: Normocephalic.  Neck: Normal range of motion. Neck supple.  Cardiovascular: Normal rate and regular rhythm.  Pulmonary/Chest: Effort normal and breath sounds normal.  Abdominal: Soft. Bowel sounds are normal. There is no tenderness. There is no rebound and no guarding.  Musculoskeletal: Normal range of motion.  Neurological: She is alert. No cranial nerve deficit.  Skin: Skin is warm and dry. No rash noted.  Psychiatric: Her speech is normal. She is slowed. She exhibits a depressed mood. She expresses suicidal ideation.     ED Treatments / Results  Labs (all labs ordered are listed, but only abnormal results are displayed) Labs Reviewed  COMPREHENSIVE METABOLIC PANEL - Abnormal; Notable for the following components:      Result Value    Potassium 2.8 (*)    Glucose, Bld 203 (*)    All other components within normal limits  ACETAMINOPHEN LEVEL - Abnormal; Notable for the following components:   Acetaminophen (Tylenol), Serum <10 (*)    All other components within normal limits  CBC - Abnormal; Notable for the following components:   WBC 10.7 (*)    RBC 5.20 (*)    All other components within normal limits  RAPID URINE DRUG SCREEN, HOSP PERFORMED - Abnormal; Notable for the following components:   Barbiturates   (*)    Value: Result not available. Reagent lot number recalled by manufacturer.   All other components within normal limits  ETHANOL  SALICYLATE LEVEL  I-STAT BETA HCG BLOOD, ED ([facility], [facility], [facility] ONLY)    EKG None  Radiology No results found.  Procedures Procedures (including critical care time)  Medications Ordered in ED Medications  ondansetron (ZOFRAN-ODT) disintegrating tablet 4 mg (4 mg Oral Given 10/29/17 2119)     Initial Impression / Assessment and Plan / ED Course  I have reviewed the triage vital signs and the nursing notes.  Pertinent labs & imaging results that were available during my care of the patient were reviewed by me and considered in my medical decision making (see chart for details).     Patient returns with continuous thoughts of suicide. No self harm.   Potassium provided/ordered to replete mild hypokalemia. Otherwise she is medically clear for TTS evaluation.   Final Clinical Impressions(s) / ED Diagnoses   Final diagnoses:  None   1. Suicidal ideation 2. Hypokalemia   ED Discharge Orders    None       Charlann Lange, Hershal Coria 10/30/17 [id number]    Ezequiel Essex, MD 10/30/17 [phone]

## 2017-10-30 NOTE — Progress Notes (Signed)
TTS called charge nurse Gerald Stabs, RN to get assistance with setting up telepsych cart.  Lind Covert, MSW, LCSW Therapeutic Triage Specialist  [phone]

## 2017-10-30 NOTE — Progress Notes (Signed)
TTS has called [phone] multiple times to set up TTS telepsych cart but continues to be routed to several different PODS. TTS to complete assessment once pt is able to be assessed.  Lind Covert, MSW, LCSW Therapeutic Triage Specialist  [phone]

## 2017-10-30 NOTE — ED Notes (Signed)
ACT team member arrived and is in w/pt.

## 2017-10-30 NOTE — ED Notes (Signed)
[name], ACT, and pt voiced understanding of d/c paperwork and pt verbalized understanding to follow up w/ACT as directed.

## 2017-10-30 NOTE — ED Notes (Signed)
Pt states she has talked to her ACT Team recently but admits she did not call them prior to coming to ED on this visit.

## 2017-10-30 NOTE — BH Assessment (Addendum)
Tele Assessment Note   Patient Name: [name] MRN: [id number] Referring Physician: Charlann Lange, PA-C Location of Patient: [facility] Location of Provider: [facility] Department  Roderick A Ashmore is an 27 y.o. female who presents to the ED voluntarily. Pt has 30 ED visits in the past 6 months. Pt states she is feeling suicidal and has a plan to OD on her medication. Pt states she feels that her medication is not working which makes her feel suicidal. Pt states she has been irritable, isolated, angry, and "running the street." Pt states she has been on her current medication for more than 7 years. Pt states she does not recall when her medications stopped being effective. Pt states she feels alone and feels that she does not have any support or resources. Pt has multiple TTS assessments c/o similar concerns on 10/27/17, 10/18/17, 10/14/17, 10/10/17, and multiple other assessments.   Pt denies HI, AVH and denies SA.  TTS consulted with [name], PA who recommends inpt treatment. EDP Charlann Lange, PA-C has been advised of the disposition. TTS to seek placement. No appropriate beds at [facility].  Diagnosis: Bipolar I disorder, current episode depressed   Past Medical History:  Past Medical History:  Diagnosis Date  . Anxiety   . Bipolar 1 disorder ([place])   . Cognitive deficits   . Depression   . Diabetes mellitus without complication ([place])   . Hypertension   . Mental disorder   . Obesity     Past Surgical History:  Procedure Laterality Date  . CESAREAN SECTION    . CESAREAN SECTION N/A 04/25/2013   Procedure: REPEAT CESAREAN SECTION;  Surgeon: [name], MD;  Location: [facility];  Service: Obstetrics;  Laterality: N/A;  . MASS EXCISION N/A 06/03/2012   Procedure: EXCISION MASS;  Surgeon: [name], MD;  Location: [facility];  Service: ENT;  Laterality: N/A;  Excision uvula mass  . TONSILLECTOMY N/A 06/03/2012   Procedure: TONSILLECTOMY;  Surgeon:  [name], MD;  Location: [facility];  Service: ENT;  Laterality: N/A;  . TONSILLECTOMY      Family History:  Family History  Problem Relation Age of Onset  . Hypertension Mother   . Diabetes Father     Social History:  reports that she has been smoking cigarettes.  She has a 33.00 pack-year smoking history. She has never used smokeless tobacco. She reports that she does not drink alcohol or use drugs.  Additional Social History:  Alcohol / Drug Use Pain Medications: See MAR Prescriptions: See MAR Over the Counter: See MAR History of alcohol / drug use?: No history of alcohol / drug abuse  CIWA: CIWA-Ar BP: (!) 133/98 Pulse Rate: 89 COWS:    Allergies:  Allergies  Allergen Reactions  . Wellbutrin [Bupropion] Shortness Of Breath  . Omnipaque [Iohexol] Swelling and Other (See Comments)    Reaction:  Eye swelling  . Penicillins Hives and Other (See Comments)    Has patient had a PCN reaction causing immediate rash, facial/tongue/throat swelling, SOB or lightheadedness with hypotension: No Has patient had a PCN reaction causing severe rash involving mucus membranes or skin necrosis: No Has patient had a PCN reaction that required hospitalization No Has patient had a PCN reaction occurring within the last 10 years: No If all of the above answers are "NO", then may proceed with Cephalosporin use.  . Depakote Er [Divalproex Sodium Er] Nausea And Vomiting    Home Medications:  (Not in a hospital  admission)  OB/GYN Status:  No LMP recorded. Patient has had an implant.  General Assessment Data Assessment unable to be completed: Yes Reason for not completing assessment: TTS has called [phone] multiple times to set up TTS telepsych cart but continues to be routed to several different PODS. TTS to complete assessment once pt is able to be assessed. Location of Assessment: [facility] ED TTS Assessment: In system Is this a Tele or Face-to-Face Assessment?: Tele  Assessment Is this an Initial Assessment or a Re-assessment for this encounter?: Initial Assessment Marital status: Single Is patient pregnant?: No Pregnancy Status: No Living Arrangements: Alone Can pt return to current living arrangement?: Yes Admission Status: Voluntary Is patient capable of signing voluntary admission?: Yes Referral Source: Self/Family/Friend Insurance type: Medicaid     Crisis Care Plan Living Arrangements: Alone Name of Psychiatrist: therapeutic alternatives Name of Therapist: therapeutic alternatives  Education Status Is patient currently in school?: No Is the patient employed, unemployed or receiving disability?: Receiving disability income  Risk to self with the past 6 months Suicidal Ideation: Yes-Currently Present Has patient been a risk to self within the past 6 months prior to admission? : No Suicidal Intent: No Has patient had any suicidal intent within the past 6 months prior to admission? : No Is patient at risk for suicide?: Yes Suicidal Plan?: Yes-Currently Present Has patient had any suicidal plan within the past 6 months prior to admission? : Yes Specify Current Suicidal Plan: pt states she had thoughts of OD on her medication  Access to Means: Yes Specify Access to Suicidal Means: pt has access to medication  What has been your use of drugs/alcohol within the last 12 months?: denies use  Previous Attempts/Gestures: Yes How many times?: (multiple) Other Self Harm Risks: isolated, lives alone, feels that she has no support, hx of mental illness  Triggers for Past Attempts: Unpredictable Intentional Self Injurious Behavior: None Family Suicide History: No Recent stressful life event(s): Other (Comment)(feels her medication is not working ) Persecutory voices/beliefs?: No Depression: Yes Depression Symptoms: Despondent, Isolating, Loss of interest in usual pleasures Substance abuse history and/or treatment for substance abuse?:  No Suicide prevention information given to non-admitted patients: Not applicable  Risk to Others within the past 6 months Homicidal Ideation: No Does patient have any lifetime risk of violence toward others beyond the six months prior to admission? : No Thoughts of Harm to Others: No Current Homicidal Intent: No Current Homicidal Plan: No Access to Homicidal Means: No History of harm to others?: No Assessment of Violence: None Noted Does patient have access to weapons?: No Criminal Charges Pending?: No Does patient have a court date: No Is patient on probation?: No  Psychosis Hallucinations: None noted Delusions: None noted  Mental Status Report Appearance/Hygiene: In scrubs, Unremarkable Eye Contact: Good Motor Activity: Freedom of movement Speech: Logical/coherent Level of Consciousness: Alert Mood: Depressed Affect: Depressed Anxiety Level: None Thought Processes: Relevant, Coherent Judgement: Partial Orientation: Person, Place, Time, Situation, Appropriate for developmental age Obsessive Compulsive Thoughts/Behaviors: None  Cognitive Functioning Concentration: Normal Memory: Remote Intact, Recent Intact Is patient IDD: No Is patient DD?: No I IQ score available?: No Insight: Poor Impulse Control: Poor Appetite: Good Have you had any weight changes? : No Change Sleep: Decreased Total Hours of Sleep: 6 Vegetative Symptoms: None  ADLScreening [facility]) Patient's cognitive ability adequate to safely complete daily activities?: Yes Patient able to express need for assistance with ADLs?: Yes Independently performs ADLs?: Yes (appropriate for developmental age)  Prior  Inpatient Therapy Prior Inpatient Therapy: Yes Prior Therapy Dates: Per chart, multiple. Prior Therapy Facilty/Provider(s): Per chart, [facility], [facility], [facility]. Reason for Treatment: SI, suicide attempt.   Prior Outpatient Therapy Prior Outpatient Therapy: Yes Prior Therapy  Dates: Current Prior Therapy Facilty/Provider(s): therapeutic alternatives Reason for Treatment: Medication management and counseling.  Does patient have an ACCT team?: Yes(therapeutic alternatives) Does patient have Intensive In-House Services?  : No Does patient have [facility] services? : No Does patient have P4CC services?: No  ADL Screening (condition at time of admission) Patient's cognitive ability adequate to safely complete daily activities?: Yes Is the patient deaf or have difficulty hearing?: No Does the patient have difficulty seeing, even when wearing glasses/contacts?: No Does the patient have difficulty concentrating, remembering, or making decisions?: No Patient able to express need for assistance with ADLs?: Yes Does the patient have difficulty dressing or bathing?: No Independently performs ADLs?: Yes (appropriate for developmental age) Does the patient have difficulty walking or climbing stairs?: No Weakness of Legs: None Weakness of Arms/Hands: None  Home Assistive Devices/Equipment Home Assistive Devices/Equipment: None    Abuse/Neglect Assessment (Assessment to be complete while patient is alone) Abuse/Neglect Assessment Can Be Completed: Yes Physical Abuse: Denies Verbal Abuse: Denies Sexual Abuse: Denies Exploitation of patient/patient's resources: Denies Self-Neglect: Denies     Regulatory affairs officer (For Healthcare) Does Patient Have a Medical Advance Directive?: No Would patient like information on creating a medical advance directive?: No - Patient declined    Additional Information 1:1 In Past 12 Months?: No CIRT Risk: No Elopement Risk: No Does patient have medical clearance?: Yes     Disposition: TTS consulted with [name], PA who recommends inpt treatment. EDP Charlann Lange, PA-C has been advised of the disposition. TTS to seek placement. No appropriate beds at [facility].   Disposition Initial Assessment Completed for this Encounter:  Yes Disposition of Patient: Admit Type of inpatient treatment program: Adult(per [name], PA) Patient refused recommended treatment: No  This service was provided via telemedicine using a 2-way, interactive audio and video technology.  Names of all persons participating in this telemedicine service and their role in this encounter. Name: [name] Role: Patient  Name: Lind Covert Role: TTS          Lyanne Co 10/30/2017 3:34 AM

## 2017-10-30 NOTE — ED Notes (Signed)
Beverlee Nims, ACT Team, advised she feels pt is ready for d/c. She called Tamala Fothergill, [facility].

## 2017-11-03 ENCOUNTER — Encounter: Payer: Self-pay | Admitting: Endocrinology

## 2017-11-03 ENCOUNTER — Encounter (HOSPITAL_COMMUNITY): Payer: Self-pay

## 2017-11-03 ENCOUNTER — Emergency Department (HOSPITAL_COMMUNITY)
Admission: EM | Admit: 2017-11-03 | Discharge: 2017-11-04 | Disposition: A | Discharge status: Home / Self Care | Payer: Medicaid Other | Attending: Emergency Medicine | Admitting: Emergency Medicine

## 2017-11-03 DIAGNOSIS — F17210 Nicotine dependence, cigarettes, uncomplicated: Secondary | ICD-10-CM | POA: Insufficient documentation

## 2017-11-03 DIAGNOSIS — R45851 Suicidal ideations: Secondary | ICD-10-CM | POA: Insufficient documentation

## 2017-11-03 DIAGNOSIS — Z79899 Other long term (current) drug therapy: Secondary | ICD-10-CM | POA: Insufficient documentation

## 2017-11-03 DIAGNOSIS — I10 Essential (primary) hypertension: Secondary | ICD-10-CM | POA: Diagnosis not present

## 2017-11-03 DIAGNOSIS — Z794 Long term (current) use of insulin: Secondary | ICD-10-CM | POA: Diagnosis not present

## 2017-11-03 DIAGNOSIS — F329 Major depressive disorder, single episode, unspecified: Secondary | ICD-10-CM | POA: Diagnosis not present

## 2017-11-03 DIAGNOSIS — F319 Bipolar disorder, unspecified: Secondary | ICD-10-CM | POA: Insufficient documentation

## 2017-11-03 DIAGNOSIS — F411 Generalized anxiety disorder: Secondary | ICD-10-CM | POA: Insufficient documentation

## 2017-11-03 DIAGNOSIS — E119 Type 2 diabetes mellitus without complications: Secondary | ICD-10-CM | POA: Diagnosis not present

## 2017-11-03 DIAGNOSIS — F32A Depression, unspecified: Secondary | ICD-10-CM

## 2017-11-03 LAB — RAPID URINE DRUG SCREEN, HOSP PERFORMED
Amphetamines: NOT DETECTED
Benzodiazepines: NOT DETECTED
Cocaine: NOT DETECTED
Opiates: NOT DETECTED
Tetrahydrocannabinol: NOT DETECTED

## 2017-11-03 LAB — CBC
HEMATOCRIT: 41.5 % (ref 36.0–46.0)
HEMOGLOBIN: 13.5 g/dL (ref 12.0–15.0)
MCH: 26.8 pg (ref 26.0–34.0)
MCHC: 32.5 g/dL (ref 30.0–36.0)
MCV: 82.5 fL (ref 78.0–100.0)
Platelets: 246 10*3/uL (ref 150–400)
RBC: 5.03 MIL/uL (ref 3.87–5.11)
RDW: 13.7 % (ref 11.5–15.5)
WBC: 10.4 10*3/uL (ref 4.0–10.5)

## 2017-11-03 LAB — I-STAT BETA HCG BLOOD, ED (MC, WL, AP ONLY): I-stat hCG, quantitative: <5 m[IU]/mL (ref <5)

## 2017-11-03 LAB — COMPREHENSIVE METABOLIC PANEL
ALBUMIN: 4.1 g/dL (ref 3.5–5.0)
ALT: 26 U/L (ref 0–44)
ANION GAP: 9 (ref 5–15)
AST: 32 U/L (ref 15–41)
Alkaline Phosphatase: 51 U/L (ref 38–126)
BUN: 7 mg/dL (ref 6–20)
CALCIUM: 10.1 mg/dL (ref 8.9–10.3)
CO2: 28 mmol/L (ref 22–32)
Chloride: 105 mmol/L (ref 98–111)
Creatinine, Ser: 0.76 mg/dL (ref 0.44–1.00)
GFR calc Af Amer: >60 mL/min (ref >60)
GFR calc non Af Amer: >60 mL/min (ref >60)
GLUCOSE: 214 mg/dL — AB (ref 70–99)
POTASSIUM: 3.3 mmol/L — AB (ref 3.5–5.1)
SODIUM: 142 mmol/L (ref 135–145)
Total Bilirubin: 0.5 mg/dL (ref 0.3–1.2)
Total Protein: 7.3 g/dL (ref 6.5–8.1)

## 2017-11-03 LAB — ACETAMINOPHEN LEVEL

## 2017-11-03 LAB — SALICYLATE LEVEL: Salicylate Lvl: <7 mg/dL (ref 2.8–30.0)

## 2017-11-03 LAB — ETHANOL: Alcohol, Ethyl (B): <10 mg/dL (ref <10)

## 2017-11-03 MED ORDER — OLANZAPINE 5 MG PO TBDP
10.0000 mg | ORAL_TABLET | Freq: Every day | ORAL | Status: DC
  Administered 2017-11-03: 10 mg via ORAL
  Filled 2017-11-03: qty 2

## 2017-11-03 NOTE — ED Notes (Signed)
Pt given [place] sandwich and two ginerales

## 2017-11-03 NOTE — ED Provider Notes (Signed)
MSE was initiated and I personally evaluated the patient and placed orders (if any) at  9:40 PM on November 03, 2017.  The patient appears stable so that the remainder of the MSE may be completed by another provider.  27 yo F with a cc of SI.  Going on for the past couple days.  Patient yelling and screaming in the hallway, disrupting patient care, will give a dose of olanzapine.     Deno Etienne, DO 11/03/17 2142

## 2017-11-03 NOTE — ED Notes (Signed)
Pt speaking to her mother at this time on telephone.

## 2017-11-03 NOTE — ED Provider Notes (Signed)
[facility] EMERGENCY DEPARTMENT Provider Note   CSN: [id number] Arrival date & time: 11/03/17  1939     History   Chief Complaint Chief Complaint  Patient presents with  . Suicidal    HPI [name] is a 27 y.o. female.  Patient here with continuous SI and depression. She states her hospitalizations for same are not helping. She states her anger is escalating and "I am losing friends because I'm arguing with them". No HI, hallucinations. She expresses that she feels living in a group home would help her. She also states she feels a different hospital might help her more because "nothing is working."   The history is provided by the patient. No language interpreter was used.    Past Medical History:  Diagnosis Date  . Anxiety   . Bipolar 1 disorder ([place])   . Cognitive deficits   . Depression   . Diabetes mellitus without complication ([place])   . Hypertension   . Mental disorder   . Obesity     Patient Active Problem List   Diagnosis Date Noted  . Psychosis ([place])   . 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 LGSIL on Pap smear of cervix 02/01/2017  . OCD (obsessive compulsive disorder) 10/05/2016  . Major depressive disorder, recurrent episode, mild ([place]) 05/04/2016  . 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 Learning disability 07/18/2015  . 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 Diabetes mellitus ([place]) 07/18/2015  . MDD (major depressive disorder), recurrent, severe, with psychosis ([place]) 07/18/2015  . Hyperlipidemia 07/18/2015  . Severe episode of recurrent major depressive disorder, without psychotic features ([place])   . Drug overdose   . Cognitive deficits 10/12/2012  . Generalized anxiety disorder 06/28/2012    Past Surgical History:  Procedure Laterality Date  . CESAREAN SECTION    . CESAREAN SECTION N/A 04/25/2013   Procedure: REPEAT CESAREAN SECTION;  Surgeon: [name], MD;   Location: [facility];  Service: Obstetrics;  Laterality: N/A;  . MASS EXCISION N/A 06/03/2012   Procedure: EXCISION MASS;  Surgeon: [name], MD;  Location: [facility];  Service: ENT;  Laterality: N/A;  Excision uvula mass  . TONSILLECTOMY N/A 06/03/2012   Procedure: TONSILLECTOMY;  Surgeon: [name], MD;  Location: [facility];  Service: ENT;  Laterality: N/A;  . TONSILLECTOMY       OB History    Gravida  3   Para  3   Term  3   Preterm  0   AB  0   Living  3     SAB  0   TAB  0   Ectopic  0   Multiple  0   Live Births  3            Home Medications    Prior to Admission medications   Medication Sig Start Date End Date Taking? Authorizing Provider  ARIPiprazole (ABILIFY) 10 MG tablet Take 10 mg by mouth daily.   Yes [provider]  busPIRone (BUSPAR) 15 MG tablet Take 1 tablet (15 mg total) by mouth 2 (two) times daily. For anxiety Patient taking differently: Take 15 mg by mouth 3 (three) times daily. For anxiety 10/06/16  Yes Nwoko, Herbert Pun I, NP  DULoxetine (CYMBALTA) 20 MG capsule Take 20 mg by mouth 2 (two) times daily.    Yes [provider]  etonogestrel (NEXPLANON) 68 MG IMPL implant 1 each by Subdermal route once. Implanted  Summer 2018   Yes [provider]  fluvoxaMINE (LUVOX) 50 MG tablet Take 1 tablet (50 mg total) by mouth at bedtime. Patient taking differently: Take 100 mg by mouth 2 (two) times daily.  10/10/17  Yes Lord, Asa Saunas, NP  glipiZIDE (GLUCOTROL) 10 MG tablet Take 10 mg by mouth 2 (two) times daily before a meal.   Yes [provider]  hydrochlorothiazide (HYDRODIURIL) 25 MG tablet Take 25 mg by mouth daily.   Yes [provider]  hydrOXYzine (VISTARIL) 25 MG capsule Take 25 mg by mouth 2 (two) times daily. 10/27/17  Yes [provider]  Insulin Detemir (LEVEMIR FLEXTOUCH) 100 UNIT/ML Pen Inject 40 Units into the skin 2 (two) times daily after a meal.    Yes [provider]  paliperidone (INVEGA SUSTENNA) 234 MG/1.5ML SUSP injection Inject 234 mg every 30 (thirty) days into the muscle.   Yes [provider]  paliperidone (INVEGA) 3 MG 24 hr tablet Take 1 tablet (3 mg total) by mouth daily. Patient not taking: Reported on 10/25/2017 10/20/17   Patrecia Pour, NP    Family History Family History  Problem Relation Age of Onset  . Hypertension Mother   . Diabetes Father     Social History Social History   Tobacco Use  . Smoking status: Current Every Day Smoker    Packs/day: 3.00    Years: 11.00    Pack years: 33.00    Types: Cigarettes  . Smokeless tobacco: Never Used  Substance Use Topics  . Alcohol use: No  . Drug use: No    Comment: Patient denies     Allergies   Wellbutrin [bupropion]; Omnipaque [iohexol]; Penicillins; and Depakote er [divalproex sodium er]   Review of Systems Review of Systems  Constitutional: Negative for chills and fever.  HENT: Negative.   Respiratory: Negative.   Cardiovascular: Negative.   Gastrointestinal: Negative.   Musculoskeletal: Negative.   Skin: Negative.   Neurological: Negative.   Psychiatric/Behavioral: Positive for agitation and suicidal ideas. Negative for hallucinations and self-injury.     Physical Exam Updated Vital Signs BP 117/73 (BP Location: Right Arm)   Pulse (!) 102   Temp 98.1 F (36.7 C) (Oral)   Resp 16   SpO2 100%   Physical Exam  Constitutional: She is oriented to person, place, and time. She appears well-developed and well-nourished.  HENT:  Head: Normocephalic.  Neck: Normal range of motion. Neck supple.  Cardiovascular: Normal rate and regular rhythm.  Pulmonary/Chest: Effort normal and breath sounds normal. She has no wheezes. She has no rales.  Abdominal: Soft. Bowel sounds are normal. There is no tenderness. There is no rebound and no guarding.  Musculoskeletal: Normal range of motion.  Neurological: She is alert and oriented to  person, place, and time.  Skin: Skin is warm and dry. No rash noted.  Psychiatric: Her affect is angry. She is agitated. She is not actively hallucinating. She expresses suicidal ideation.     ED Treatments / Results  Labs (all labs ordered are listed, but only abnormal results are displayed) Labs Reviewed  COMPREHENSIVE METABOLIC PANEL - Abnormal; Notable for the following components:      Result Value   Potassium 3.3 (*)    Glucose, Bld 214 (*)    All other components within normal limits  ACETAMINOPHEN LEVEL - Abnormal; Notable for the following components:   Acetaminophen (Tylenol), Serum <10 (*)    All other components within normal limits  ETHANOL  SALICYLATE  LEVEL  CBC  RAPID URINE DRUG SCREEN, HOSP PERFORMED  I-STAT BETA HCG BLOOD, ED ([facility], [facility], [facility] ONLY)   Results for orders placed or performed during the hospital encounter of 11/03/17  Comprehensive metabolic panel  Result Value Ref Range   Sodium 142 135 - 145 mmol/L   Potassium 3.3 (L) 3.5 - 5.1 mmol/L   Chloride 105 98 - 111 mmol/L   CO2 28 22 - 32 mmol/L   Glucose, Bld 214 (H) 70 - 99 mg/dL   BUN 7 6 - 20 mg/dL   Creatinine, Ser 0.76 0.44 - 1.00 mg/dL   Calcium 10.1 8.9 - 10.3 mg/dL   Total Protein 7.3 6.5 - 8.1 g/dL   Albumin 4.1 3.5 - 5.0 g/dL   AST 32 15 - 41 U/L   ALT 26 0 - 44 U/L   Alkaline Phosphatase 51 38 - 126 U/L   Total Bilirubin 0.5 0.3 - 1.2 mg/dL   GFR calc non Af Amer >60 >60 mL/min   GFR calc Af Amer >60 >60 mL/min   Anion gap 9 5 - 15  Ethanol  Result Value Ref Range   Alcohol, Ethyl (B) <28 <31 mg/dL  Salicylate level  Result Value Ref Range   Salicylate Lvl <5.1 2.8 - 30.0 mg/dL  Acetaminophen level  Result Value Ref Range   Acetaminophen (Tylenol), Serum <10 (L) 10 - 30 ug/mL  cbc  Result Value Ref Range   WBC 10.4 4.0 - 10.5 K/uL   RBC 5.03 3.87 - 5.11 MIL/uL   Hemoglobin 13.5 12.0 - 15.0 g/dL   HCT 41.5 36.0 - 46.0 %   MCV 82.5 78.0 - 100.0 fL   MCH 26.8 26.0 - 34.0 pg    MCHC 32.5 30.0 - 36.0 g/dL   RDW 13.7 11.5 - 15.5 %   Platelets 246 150 - 400 K/uL  I-Stat beta hCG blood, ED  Result Value Ref Range   I-stat hCG, quantitative <5.0 <5 mIU/mL   Comment 3             EKG None  Radiology No results found.  Procedures Procedures (including critical care time)  Medications Ordered in ED Medications  OLANZapine zydis (ZYPREXA) disintegrating tablet 10 mg (10 mg Oral Given 11/03/17 2206)     Initial Impression / Assessment and Plan / ED Course  I have reviewed the triage vital signs and the nursing notes.  Pertinent labs & imaging results that were available during my care of the patient were reviewed by me and considered in my medical decision making (see chart for details).     Patient well known to the ED with continuous SI, increasing agitation and anger issues, depression. TTS will be re-consulted to help with appropriate disposition.  Final Clinical Impressions(s) / ED Diagnoses   Final diagnoses:  None   1. SI, continuous 2. Depression 3. anger  ED Discharge Orders    None       Charlann Lange, Hershal Coria 11/03/17 2246    Drenda Freeze, MD 11/04/17 1434

## 2017-11-03 NOTE — ED Notes (Signed)
Pt ambulated to restroom with no problems

## 2017-11-03 NOTE — ED Triage Notes (Signed)
Pt states that she is feeling SI since her ACT team hung up on her, pt reports she wants to "get out her brains, I will be good " pt reports that she want to go live in a group home. Pt excessively loud in triage

## 2017-11-03 NOTE — ED Notes (Signed)
Pt coming to the nurses stations pt states she wants to get out of this body and she has anxeity and does not want [facility] and [facility] and to [facility] to get real help. And get out of this and I need to blow my mind brains out. im tired of life and I really need some help. I just want help. Pt redirected back to the room. Pt was redirected and went back to the room at this time. Rn states to update [facility] on what patient wants to go to [employer].

## 2017-11-03 NOTE — ED Notes (Signed)
Pt provided with sandwich and drink at this time

## 2017-11-03 NOTE — ED Notes (Signed)
Belongings in locker 6 

## 2017-11-04 ENCOUNTER — Encounter (HOSPITAL_COMMUNITY): Payer: Self-pay | Admitting: Registered Nurse

## 2017-11-04 LAB — CBG MONITORING, ED
GLUCOSE-CAPILLARY: 210 mg/dL — AB (ref 70–99)
Glucose-Capillary: 247 mg/dL — ABNORMAL HIGH (ref 70–99)

## 2017-11-04 MED ORDER — HYDROXYZINE HCL 25 MG PO TABS
25.0000 mg | ORAL_TABLET | Freq: Two times a day (BID) | ORAL | Status: DC
  Administered 2017-11-04: 25 mg via ORAL
  Filled 2017-11-04 (×3): qty 3

## 2017-11-04 MED ORDER — GLIPIZIDE 10 MG PO TABS
10.0000 mg | ORAL_TABLET | Freq: Two times a day (BID) | ORAL | Status: DC
  Administered 2017-11-04: 10 mg via ORAL
  Filled 2017-11-04 (×3): qty 1

## 2017-11-04 MED ORDER — LORAZEPAM 1 MG PO TABS
1.0000 mg | ORAL_TABLET | Freq: Four times a day (QID) | ORAL | Status: DC | PRN
  Administered 2017-11-04: 1 mg via ORAL
  Filled 2017-11-04: qty 1

## 2017-11-04 MED ORDER — BUSPIRONE HCL 10 MG PO TABS
15.0000 mg | ORAL_TABLET | Freq: Three times a day (TID) | ORAL | Status: DC
  Administered 2017-11-04: 15 mg via ORAL
  Filled 2017-11-04: qty 1.5
  Filled 2017-11-04: qty 2

## 2017-11-04 MED ORDER — HYDROCHLOROTHIAZIDE 25 MG PO TABS
25.0000 mg | ORAL_TABLET | Freq: Every day | ORAL | Status: DC
  Administered 2017-11-04: 25 mg via ORAL
  Filled 2017-11-04 (×2): qty 1

## 2017-11-04 MED ORDER — ARIPIPRAZOLE 10 MG PO TABS
10.0000 mg | ORAL_TABLET | Freq: Every day | ORAL | Status: DC
  Administered 2017-11-04: 10 mg via ORAL
  Filled 2017-11-04 (×2): qty 1

## 2017-11-04 MED ORDER — FLUVOXAMINE MALEATE 50 MG PO TABS
100.0000 mg | ORAL_TABLET | Freq: Two times a day (BID) | ORAL | Status: DC
  Administered 2017-11-04 (×2): 100 mg via ORAL
  Filled 2017-11-04 (×4): qty 2

## 2017-11-04 MED ORDER — DULOXETINE HCL 20 MG PO CPEP
20.0000 mg | ORAL_CAPSULE | Freq: Two times a day (BID) | ORAL | Status: DC
  Administered 2017-11-04: 20 mg via ORAL
  Filled 2017-11-04 (×2): qty 1

## 2017-11-04 MED ORDER — INSULIN DETEMIR 100 UNIT/ML ~~LOC~~ SOLN
40.0000 [IU] | Freq: Two times a day (BID) | SUBCUTANEOUS | Status: DC
  Administered 2017-11-04: 40 [IU] via SUBCUTANEOUS
  Filled 2017-11-04 (×2): qty 0.4

## 2017-11-04 NOTE — ED Notes (Signed)
Pt received lunch tray 

## 2017-11-04 NOTE — ED Notes (Signed)
Pt broke pt buspar in half and states she is taking half of her abilify. Informed pt not abilify and had already taken full dose. No response.

## 2017-11-04 NOTE — ED Notes (Signed)
Pt states she does not want to wait for ACT team. MD aware.

## 2017-11-04 NOTE — BH Assessment (Addendum)
Tele Assessment Note   Patient Name: [name] MRN: [id number] Referring Physician: Charlann Lange PA Location of Patient: [facility] Location of Provider: [place] is an 27 y.o. female came voluntarily to the [facility] with thoughts of overdosing on her prescription medication. This pattern of thinking is consistent with pt's baseline. Pt sts she is "tired of life." Pt sts she feels alone and wants to live in a group home for connection and support. Pt sts she receives services from [facility] ACT Team. In a previous assessment on 10/30/17, pt sts she receives services from [facility] ACT Team. Pt has been previously diagnosed with Bipolar 1 D/O, GAD and Borderline Intellectual Functioning. Pt denies HI, SHI and AVH. Pt sts she sleeps 4 hours each night and eats regularly. Pt denies alcohol or drug use and pt's labs are all negative for all substances tested for in the ED tonight.   Pt was dressed in scrubs and sitting on her hospital bed. Pt was alert, cooperative and polite. Pt kept good eye contact, spoke in a clear tone and at a normal pace. Pt moved in a normal manner when moving. Pt's thought process was coherent and relevant and judgement was impaired.  No indication of delusional thinking or response to internal stimuli. Pt's mood was stated as depressed but not anxious and her blunted affect was congruent.  Pt was oriented x 4, to person, place, time and situation.   Per TTS Assessment on 10/30/17: "[name] is an 27 y.o. female who presents to the ED voluntarily. Pt has 30 ED visits in the past 6 months. Pt states she is feeling suicidal and has a plan to OD on her medication. Pt states she feels that her medication is not working which makes her feel suicidal. Pt states she has been irritable, isolated, angry, and "running the street." Pt states she has been on her current medication for more than 7 years. Pt states she does not  recall when her medications stopped being effective. Pt states she feels alone and feels that she does not have any support or resources. Pt has multiple TTS assessments c/o similar concerns on 10/27/17, 10/18/17, 10/14/17, 10/10/17, and multiple other assessments.   Pt denies HI, AVH and denies SA."  Diagnosis: Bipolar 1 D/O; GAD; Borderline Intellectual Functioning (per hx)  Past Medical History:  Past Medical History:  Diagnosis Date  . Anxiety   . Bipolar 1 disorder ([place])   . Cognitive deficits   . Depression   . Diabetes mellitus without complication ([place])   . Hypertension   . Mental disorder   . Obesity     Past Surgical History:  Procedure Laterality Date  . CESAREAN SECTION    . CESAREAN SECTION N/A 04/25/2013   Procedure: REPEAT CESAREAN SECTION;  Surgeon: [name], MD;  Location: [facility];  Service: Obstetrics;  Laterality: N/A;  . MASS EXCISION N/A 06/03/2012   Procedure: EXCISION MASS;  Surgeon: [name], MD;  Location: [facility];  Service: ENT;  Laterality: N/A;  Excision uvula mass  . TONSILLECTOMY N/A 06/03/2012   Procedure: TONSILLECTOMY;  Surgeon: [name], MD;  Location: [facility];  Service: ENT;  Laterality: N/A;  . TONSILLECTOMY      Family History:  Family History  Problem Relation Age of Onset  . Hypertension Mother   . Diabetes Father     Social History:  reports that she has been  smoking cigarettes.  She has a 33.00 pack-year smoking history. She has never used smokeless tobacco. She reports that she does not drink alcohol or use drugs.  Additional Social History:  Alcohol / Drug Use Prescriptions: SEE MAR History of alcohol / drug use?: No history of alcohol / drug abuse(DENIES)  CIWA: CIWA-Ar BP: 117/73 Pulse Rate: (!) 102 COWS:    Allergies:  Allergies  Allergen Reactions  . Wellbutrin [Bupropion] Shortness Of Breath  . Omnipaque [Iohexol] Swelling and Other (See Comments)    Reaction:  Eye  swelling  . Penicillins Hives and Other (See Comments)    Has patient had a PCN reaction causing immediate rash, facial/tongue/throat swelling, SOB or lightheadedness with hypotension: No Has patient had a PCN reaction causing severe rash involving mucus membranes or skin necrosis: No Has patient had a PCN reaction that required hospitalization No Has patient had a PCN reaction occurring within the last 10 years: No If all of the above answers are "NO", then may proceed with Cephalosporin use.  . Depakote Er [Divalproex Sodium Er] Nausea And Vomiting    Home Medications:  (Not in a hospital admission)  OB/GYN Status:  No LMP recorded. Patient has had an implant.  General Assessment Data Location of Assessment: [facility] ED TTS Assessment: In system Is this a Tele or Face-to-Face Assessment?: Tele Assessment Is this an Initial Assessment or a Re-assessment for this encounter?: Initial Assessment Marital status: Single Maiden name: Zelenak Is patient pregnant?: Unknown Pregnancy Status: Unknown Living Arrangements: Alone Can pt return to current living arrangement?: Yes Admission Status: Voluntary Is patient capable of signing voluntary admission?: Yes Referral Source: Self/Family/Friend Insurance type: MEDICAID     Crisis Care Plan Living Arrangements: Alone Name of Psychiatrist: [facility] Name of Therapist: [facility]  Education Status Is patient currently in school?: No Highest grade of school patient has completed: 11 Is the patient employed, unemployed or receiving disability?: Receiving disability income  Risk to self with the past 6 months Suicidal Ideation: Yes-Currently Present Has patient been a risk to self within the past 6 months prior to admission? : Yes Suicidal Intent: No Has patient had any suicidal intent within the past 6 months prior to admission? : No Is patient at risk for suicide?: Yes Suicidal Plan?: Yes-Currently  Present Has patient had any suicidal plan within the past 6 months prior to admission? : Yes Specify Current Suicidal Plan: PLAN TO OD ON PRESCRIPTION MEDS Access to Means: Yes Specify Access to Suicidal Means: RX MEDS What has been your use of drugs/alcohol within the last 12 months?: DENIES USE OF ANY BUT RX Previous Attempts/Gestures: Yes How many times?: (MULTIPLE) Other Self Harm Risks: NONE REPORTED Triggers for Past Attempts: Unpredictable Intentional Self Injurious Behavior: None Family Suicide History: No Recent stressful life event(s): (NO NEW STRESSORS REPORTED) Persecutory voices/beliefs?: No Depression: Yes Depression Symptoms: Despondent, Insomnia, Loss of interest in usual pleasures Substance abuse history and/or treatment for substance abuse?: Yes Suicide prevention information given to non-admitted patients: Not applicable  Risk to Others within the past 6 months Homicidal Ideation: No Does patient have any lifetime risk of violence toward others beyond the six months prior to admission? : No Thoughts of Harm to Others: No Current Homicidal Intent: No Current Homicidal Plan: No Access to Homicidal Means: No Identified Victim: NONE History of harm to others?: No Assessment of Violence: None Noted Violent Behavior Description: NA Does patient have access to weapons?: No Criminal Charges  Pending?: No Does patient have a court date: No Is patient on probation?: No  Psychosis Hallucinations: None noted Delusions: None noted  Mental Status Report Appearance/Hygiene: In scrubs, Unremarkable Eye Contact: Good Motor Activity: Freedom of movement Speech: Logical/coherent Level of Consciousness: Alert Mood: Depressed Affect: Depressed, Blunted Anxiety Level: Minimal Thought Processes: Coherent, Relevant Judgement: Partial Orientation: Person, Place, Time, Situation Obsessive Compulsive Thoughts/Behaviors: None  Cognitive Functioning Concentration:  Normal Memory: Recent Intact, Remote Intact Is patient IDD: No Level of Function: BORDERLINE INTELLECTUAL FUNCTIONING PER PT RECORD Is patient DD?: Yes I IQ score available?: No Insight: Poor Impulse Control: Poor Appetite: Good Have you had any weight changes? : No Change Amount of the weight change? (lbs): 0 lbs Sleep: Decreased Total Hours of Sleep: 4 Vegetative Symptoms: None  ADLScreening [facility] Assessment Services) Patient's cognitive ability adequate to safely complete daily activities?: Yes Patient able to express need for assistance with ADLs?: Yes Independently performs ADLs?: Yes (appropriate for developmental age)  Prior Inpatient Therapy Prior Inpatient Therapy: Yes Prior Therapy Dates: MULTIPLE PER PT RECORD Prior Therapy Facilty/Provider(s): [facility], [facility], [facility] Reason for Treatment: SI, BIPOLAR  Prior Outpatient Therapy Prior Outpatient Therapy: Yes Prior Therapy Dates: CURRENT Prior Therapy Facilty/Provider(s): EITHER [facility] OT PST ACTT Reason for Treatment: MED MGMT & THERAPY Does patient have an ACCT team?: Yes Does patient have Intensive In-House Services?  : No Does patient have [facility] services? : No Does patient have P4CC services?: No  ADL Screening (condition at time of admission) Patient's cognitive ability adequate to safely complete daily activities?: Yes Patient able to express need for assistance with ADLs?: Yes Independently performs ADLs?: Yes (appropriate for developmental age)       Abuse/Neglect Assessment (Assessment to be complete while patient is alone) Physical Abuse: Denies Verbal Abuse: Denies Sexual Abuse: Denies Exploitation of patient/patient's resources: Denies Self-Neglect: Denies     Regulatory affairs officer (For Healthcare) Does Patient Have a Medical Advance Directive?: No Would patient like information on creating a medical advance directive?: No - Patient declined          Disposition:   Disposition Initial Assessment Completed for this Encounter: Yes Patient referred to: Other (Comment)(PENDING REVIEW [facility] EXTENDER)  This service was provided via telemedicine using a 2-way, interactive audio and Radiographer, therapeutic.  Names of all persons participating in this telemedicine service and their role in this encounter. Name: Faylene Kurtz, [facility] Role: TTS  Name: [name] Role: Patient  Name:  Role:   Name:  Role:    Per Lindon Romp, NP recommend continued observation and later re-evaluation for final disposition.  Spoke with Stacy Lange PA and advised of recommendation.   Faylene Kurtz, MS, CRC, [place] Triage Specialist [facility] T 11/04/2017 1:32 AM

## 2017-11-04 NOTE — ED Notes (Signed)
Pt made aware of discharge . Ask to contact act team. Same done.

## 2017-11-04 NOTE — Discharge Instructions (Addendum)
Please return for any problem.  Follow-up with your regular doctor as instructed. °

## 2017-11-04 NOTE — Consult Note (Signed)
Tele Assessment   [name], 27 y.o., female patient presented to [facility] with complaints of suicidal ideation.  Patient seen via telepsych by this provider; chart reviewed and consulted with Dr. Dwyane Dee on 11/04/17.  Patient has had multiple complaints to the ED with similar complaints.  Patient has reported that she lives alone and that is her main stressor feeling lonely and has stated on multiple occassions that she would like to be placed in a group home or family care home.  Patient has history of borderline intellectual functioning and doesn't appear to have the mental capabilities of doing on her own.  Patient is   On evaluation [name] reports that she is no longer suicidal.  States that she doesn't like staying in her house by herself.  "I don't like sleep at home by my self."  Patient asked if she was aware that her ACTT team had been working on finding her a group home.  Patient states that she wasn't aware.  Patient states that she has never been in a group home but she has visited one before.  Patient states that her stressor for depression is being alone.  States that she doesn't have any family or friends.  At this time patient denies suicidal/self-harm/homicidal ideation, psychosis, and paranoia.   During evaluation [name] is alert/oriented x 4; calm/cooperative; and mood congruent with affect.  She does not appear to be responding to internal/external stimuli or delusional thoughts.  Patient denied suicidal/self-harm/homicidal ideation, psychosis, and paranoia.  Patient answered question appropriately.  Patient psychiatrically cleared.  Will request that SW and ACTT team work together to assist patient in finding group home or family care home Patient has ACTT services with [facility] TTS will consult with to see if patient has an IDD coordinator who will also be able to assist patient .   Velna Hatchet, LPC spoke with [facility] who informed that patient has requested  to be placed in a group home often and that they have been working with her.   Recommendations:  SW and ACTT to assist patient with group/family care home resources or placement.  Patient to follow up with current outpatient psychiatric provider and ACTT team  Disposition: Patient psychiatrically cleared No evidence of imminent risk to self or others at present.   Patient does not meet criteria for psychiatric inpatient admission.  Spoke with Dr. Francia Greaves; informed of above recommendations and disposition  [name], NP

## 2017-11-04 NOTE — ED Notes (Signed)
Pt oob to bathroom with steady gait. 

## 2017-11-04 NOTE — ED Notes (Signed)
Carb mod tray ordered 

## 2017-11-14 ENCOUNTER — Emergency Department (HOSPITAL_COMMUNITY)
Admission: EM | Admit: 2017-11-14 | Discharge: 2017-11-14 | Discharge status: Against Medical Advice | Payer: Medicaid Other | Attending: Emergency Medicine | Admitting: Emergency Medicine

## 2017-11-14 ENCOUNTER — Encounter (HOSPITAL_COMMUNITY): Payer: Self-pay | Admitting: Emergency Medicine

## 2017-11-14 DIAGNOSIS — F17200 Nicotine dependence, unspecified, uncomplicated: Secondary | ICD-10-CM | POA: Insufficient documentation

## 2017-11-14 DIAGNOSIS — Z5321 Procedure and treatment not carried out due to patient leaving prior to being seen by health care provider: Secondary | ICD-10-CM | POA: Insufficient documentation

## 2017-11-14 DIAGNOSIS — R0602 Shortness of breath: Secondary | ICD-10-CM | POA: Diagnosis not present

## 2017-11-14 NOTE — ED Triage Notes (Addendum)
Reports feeling sob at home while around cigarette smoke but better now at hospital.  Lungs clear, breathing easy and nonlabored.  Also c/o having frequent bm's especially after eating but denies any pain or nausea or diarrhea.  Reports stools as normal.

## 2017-11-14 NOTE — ED Triage Notes (Signed)
Pt transported from home by EMS with c/o burning sensation in nose d/t smoking too many cigars today.  Pt ambulatory to [place] to wait for triage room

## 2017-11-14 NOTE — ED Notes (Signed)
Called Pt to reassess. No answer X3 

## 2017-11-14 NOTE — ED Notes (Signed)
RN called patient for vitals check with no answer.

## 2017-11-21 ENCOUNTER — Encounter (HOSPITAL_COMMUNITY): Payer: Self-pay

## 2017-11-21 ENCOUNTER — Emergency Department (HOSPITAL_COMMUNITY)
Admission: EM | Admit: 2017-11-21 | Discharge: 2017-11-21 | Disposition: A | Discharge status: Home / Self Care | Payer: Medicaid Other | Attending: Emergency Medicine | Admitting: Emergency Medicine

## 2017-11-21 ENCOUNTER — Other Ambulatory Visit: Payer: Self-pay

## 2017-11-21 DIAGNOSIS — R079 Chest pain, unspecified: Secondary | ICD-10-CM | POA: Insufficient documentation

## 2017-11-21 DIAGNOSIS — J029 Acute pharyngitis, unspecified: Secondary | ICD-10-CM | POA: Diagnosis not present

## 2017-11-21 DIAGNOSIS — Z794 Long term (current) use of insulin: Secondary | ICD-10-CM | POA: Diagnosis not present

## 2017-11-21 DIAGNOSIS — I10 Essential (primary) hypertension: Secondary | ICD-10-CM | POA: Insufficient documentation

## 2017-11-21 DIAGNOSIS — Z79899 Other long term (current) drug therapy: Secondary | ICD-10-CM | POA: Diagnosis not present

## 2017-11-21 DIAGNOSIS — E119 Type 2 diabetes mellitus without complications: Secondary | ICD-10-CM | POA: Diagnosis not present

## 2017-11-21 DIAGNOSIS — F17210 Nicotine dependence, cigarettes, uncomplicated: Secondary | ICD-10-CM | POA: Insufficient documentation

## 2017-11-21 LAB — I-STAT TROPONIN, ED: TROPONIN I, POC: 0 ng/mL (ref 0.00–0.08)

## 2017-11-21 LAB — CBG MONITORING, ED: Glucose-Capillary: 232 mg/dL — ABNORMAL HIGH (ref 70–99)

## 2017-11-21 LAB — GROUP A STREP BY PCR: GROUP A STREP BY PCR: NOT DETECTED

## 2017-11-21 MED ORDER — ACETAMINOPHEN 325 MG PO TABS
650.0000 mg | ORAL_TABLET | Freq: Once | ORAL | Status: AC
  Administered 2017-11-21: 650 mg via ORAL
  Filled 2017-11-21: qty 2

## 2017-11-21 MED ORDER — ONDANSETRON 4 MG PO TBDP
4.0000 mg | ORAL_TABLET | Freq: Once | ORAL | Status: AC
  Administered 2017-11-21: 4 mg via ORAL
  Filled 2017-11-21: qty 1

## 2017-11-21 NOTE — ED Triage Notes (Signed)
shc e/o sore throat since yesterday. She is in no distress.

## 2017-11-21 NOTE — ED Provider Notes (Signed)
[facility] DEPT Provider Note   CSN: [id number] Arrival date & time: 11/21/17  1758     History   Chief Complaint Chief Complaint  Patient presents with  . Sore Throat    HPI [name] is a 27 y.o. female with multiple medical problems as listed in [place] who visits the ED often and has a care plan presents to the ED via EMS with sore throat that started yesterday. She also c/o chest pain. Patient reports that she smokes a lot of cigars and thinks that causes some shortness of breath sometimes. The symptoms started yesterday.    HPI  Past Medical History:  Diagnosis Date  . Anxiety   . Bipolar 1 disorder ([facility])   . Cognitive deficits   . Depression   . Diabetes mellitus without complication ([place])   . Hypertension   . Mental disorder   . Obesity     Patient Active Problem List   Diagnosis Date Noted  . Psychosis ([place])   . 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 LGSIL on Pap smear of cervix 02/01/2017  . OCD (obsessive compulsive disorder) 10/05/2016  . Major depressive disorder, recurrent episode, mild ([place]) 05/04/2016  . 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 Learning disability 07/18/2015  . 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 Diabetes mellitus ([place]) 07/18/2015  . MDD (major depressive disorder), recurrent, severe, with psychosis ([place]) 07/18/2015  . Hyperlipidemia 07/18/2015  . Severe episode of recurrent major depressive disorder, without psychotic features ([place])   . Drug overdose   . Cognitive deficits 10/12/2012  . Generalized anxiety disorder 06/28/2012    Past Surgical History:  Procedure Laterality Date  . CESAREAN SECTION    . CESAREAN SECTION N/A 04/25/2013   Procedure: REPEAT CESAREAN SECTION;  Surgeon: [name], MD;  Location: [facility];  Service: Obstetrics;  Laterality: N/A;  . MASS EXCISION N/A 06/03/2012   Procedure: EXCISION MASS;  Surgeon: [name], MD;  Location: [facility];  Service: ENT;  Laterality: N/A;  Excision uvula mass  . TONSILLECTOMY N/A 06/03/2012   Procedure: TONSILLECTOMY;  Surgeon: [name], MD;  Location: [facility];  Service: ENT;  Laterality: N/A;  . TONSILLECTOMY       OB History    Gravida  3   Para  3   Term  3   Preterm  0   AB  0   Living  3     SAB  0   TAB  0   Ectopic  0   Multiple  0   Live Births  3            Home Medications    Prior to Admission medications   Medication Sig Start Date End Date Taking? Authorizing Provider  ARIPiprazole (ABILIFY) 10 MG tablet Take 10 mg by mouth daily.    [provider]  busPIRone (BUSPAR) 15 MG tablet Take 1 tablet (15 mg total) by mouth 2 (two) times daily. For anxiety Patient taking differently: Take 15 mg by mouth 3 (three) times daily. For anxiety 10/06/16   Lindell Spar I, NP  DULoxetine (CYMBALTA) 20 MG capsule Take 20 mg by mouth 2 (two) times daily.     [provider]  etonogestrel (NEXPLANON) 68 MG IMPL implant 1 each by Subdermal route once. Implanted Summer 2018    [provider]  fluvoxaMINE (LUVOX) 50 MG tablet Take 1 tablet (50 mg total)  by mouth at bedtime. Patient taking differently: Take 100 mg by mouth 2 (two) times daily.  10/10/17   Patrecia Pour, NP  glipiZIDE (GLUCOTROL) 10 MG tablet Take 10 mg by mouth 2 (two) times daily before a meal.    [provider]  hydrochlorothiazide (HYDRODIURIL) 25 MG tablet Take 25 mg by mouth daily.    [provider]  hydrOXYzine (VISTARIL) 25 MG capsule Take 25 mg by mouth 2 (two) times daily. 10/27/17   [provider]  Insulin Detemir (LEVEMIR FLEXTOUCH) 100 UNIT/ML Pen Inject 40 Units into the skin 2 (two) times daily after a meal.    [provider]  paliperidone (INVEGA SUSTENNA) 234 MG/1.5ML SUSP injection Inject 234 mg every 30 (thirty) days into the muscle.    [provider]  paliperidone (INVEGA) 3 MG 24 hr tablet Take 1 tablet (3 mg total) by mouth daily. Patient not taking: Reported on 10/25/2017 10/20/17   Patrecia Pour, NP    Family History Family History  Problem Relation Age of Onset  . Hypertension Mother   . Diabetes Father     Social History Social History   Tobacco Use  . Smoking status: Current Every Day Smoker    Packs/day: 3.00    Years: 11.00    Pack years: 33.00    Types: Cigarettes  . Smokeless tobacco: Never Used  Substance Use Topics  . Alcohol use: No  . Drug use: No    Comment: Patient denies     Allergies   Wellbutrin [bupropion]; Omnipaque [iohexol]; Penicillins; and Depakote er [divalproex sodium er]   Review of Systems Review of Systems  HENT: Positive for sore throat.   Cardiovascular: Positive for chest pain.  Gastrointestinal: Positive for nausea.  All other systems reviewed and are negative.    Physical Exam Updated Vital Signs BP 136/83 (BP Location: Left Arm)   Pulse (!) 108   Temp 99.2 F (37.3 C) (Oral)   Resp 14   SpO2 95%   Physical Exam  Constitutional: No distress.  Morbidly obese  HENT:  Head: Normocephalic.  Right Ear: Tympanic membrane normal.  Left Ear: Tympanic membrane normal.  Nose: Nose normal.  Mouth/Throat: Uvula is midline and mucous membranes are normal. Posterior oropharyngeal erythema (mild) present. No posterior oropharyngeal edema.  Eyes: EOM are normal.  Neck: Trachea normal and normal range of motion. Neck supple. Normal carotid pulses and no JVD present. Carotid bruit is not present.  Cardiovascular: Regular rhythm. Tachycardia present.  Pulmonary/Chest: Effort normal. No stridor. No respiratory distress. She has no wheezes. She has no rales.  Abdominal: Soft. There is no tenderness.  Musculoskeletal: Normal range of motion.  Lymphadenopathy:    She has no cervical adenopathy.  Neurological: She is alert.  Skin: Skin is warm and dry.  Nursing note  and vitals reviewed.    ED Treatments / Results  Labs (all labs ordered are listed, but only abnormal results are displayed) Labs Reviewed  CBG MONITORING, ED - Abnormal; Notable for the following components:      Result Value   Glucose-Capillary 232 (*)    All other components within normal limits  GROUP A STREP BY PCR  I-STAT TROPONIN, ED    EKG EKG Interpretation  Date/Time:  Sunday November 21 2017 19:34:21 EDT Ventricular Rate:  111 PR Interval:    QRS Duration: 91 QT Interval:  337 QTC Calculation: 458 R Axis:   66 Text Interpretation:  Sinus tachycardia No significant change  since last tracing Confirmed by Blanchie Dessert [phone]) on 11/21/2017 8:20:52 PM  I discussed this case with Dr. Maryan Rued and she reviewed patient's past visits and EKG today. Patient appears stable for d/c and reports her symptoms have improved. Discussed with the patient importance of follow up with PCP. She agrees with plan.  Radiology No results found.  Procedures Procedures (including critical care time)  Medications Ordered in ED Medications  acetaminophen (TYLENOL) tablet 650 mg (650 mg Oral Given 11/21/17 2106)  ondansetron (ZOFRAN-ODT) disintegrating tablet 4 mg (4 mg Oral Given 11/21/17 2106)     Initial Impression / Assessment and Plan / ED Course  I have reviewed the triage vital signs and the nursing notes.   Final Clinical Impressions(s) / ED Diagnoses   Final diagnoses:  Acute pharyngitis, unspecified etiology  Nonspecific chest pain    ED Discharge Orders    None       Debroah Baller [place], [place] 11/21/17 2204    Blanchie Dessert, MD 11/21/17 2250

## 2017-11-29 ENCOUNTER — Encounter (HOSPITAL_COMMUNITY): Payer: Self-pay

## 2017-11-29 ENCOUNTER — Emergency Department (HOSPITAL_COMMUNITY)
Admission: EM | Admit: 2017-11-29 | Discharge: 2017-11-30 | Disposition: A | Discharge status: Other Acute Inpatient Hospital | Payer: Medicaid Other | Attending: Emergency Medicine | Admitting: Emergency Medicine

## 2017-11-29 ENCOUNTER — Other Ambulatory Visit: Payer: Self-pay

## 2017-11-29 DIAGNOSIS — F332 Major depressive disorder, recurrent severe without psychotic features: Secondary | ICD-10-CM | POA: Insufficient documentation

## 2017-11-29 DIAGNOSIS — Z046 Encounter for general psychiatric examination, requested by authority: Secondary | ICD-10-CM | POA: Insufficient documentation

## 2017-11-29 DIAGNOSIS — R110 Nausea: Secondary | ICD-10-CM | POA: Insufficient documentation

## 2017-11-29 DIAGNOSIS — F3132 Bipolar disorder, current episode depressed, moderate: Secondary | ICD-10-CM | POA: Diagnosis present

## 2017-11-29 DIAGNOSIS — I10 Essential (primary) hypertension: Secondary | ICD-10-CM | POA: Insufficient documentation

## 2017-11-29 DIAGNOSIS — F17210 Nicotine dependence, cigarettes, uncomplicated: Secondary | ICD-10-CM | POA: Insufficient documentation

## 2017-11-29 DIAGNOSIS — R45851 Suicidal ideations: Secondary | ICD-10-CM | POA: Insufficient documentation

## 2017-11-29 DIAGNOSIS — Z794 Long term (current) use of insulin: Secondary | ICD-10-CM | POA: Insufficient documentation

## 2017-11-29 DIAGNOSIS — Z79899 Other long term (current) drug therapy: Secondary | ICD-10-CM | POA: Insufficient documentation

## 2017-11-29 DIAGNOSIS — E119 Type 2 diabetes mellitus without complications: Secondary | ICD-10-CM | POA: Insufficient documentation

## 2017-11-29 DIAGNOSIS — F201 Disorganized schizophrenia: Secondary | ICD-10-CM | POA: Insufficient documentation

## 2017-11-29 LAB — RAPID URINE DRUG SCREEN, HOSP PERFORMED
Amphetamines: NOT DETECTED
Barbiturates: NOT DETECTED
Benzodiazepines: NOT DETECTED
Cocaine: NOT DETECTED
Opiates: NOT DETECTED
Tetrahydrocannabinol: NOT DETECTED

## 2017-11-29 LAB — CBC
HCT: 40.3 % (ref 36.0–46.0)
Hemoglobin: 13.4 g/dL (ref 12.0–15.0)
MCH: 26.8 pg (ref 26.0–34.0)
MCHC: 33.3 g/dL (ref 30.0–36.0)
MCV: 80.6 fL (ref 78.0–100.0)
PLATELETS: 265 10*3/uL (ref 150–400)
RBC: 5 MIL/uL (ref 3.87–5.11)
RDW: 13.5 % (ref 11.5–15.5)
WBC: 10.3 10*3/uL (ref 4.0–10.5)

## 2017-11-29 LAB — COMPREHENSIVE METABOLIC PANEL
ALT: 30 U/L (ref 0–44)
AST: 33 U/L (ref 15–41)
Albumin: 4.2 g/dL (ref 3.5–5.0)
Alkaline Phosphatase: 49 U/L (ref 38–126)
Anion gap: 13 (ref 5–15)
BILIRUBIN TOTAL: 0.5 mg/dL (ref 0.3–1.2)
BUN: 10 mg/dL (ref 6–20)
CALCIUM: 9.7 mg/dL (ref 8.9–10.3)
CHLORIDE: 99 mmol/L (ref 98–111)
CO2: 28 mmol/L (ref 22–32)
Creatinine, Ser: 0.76 mg/dL (ref 0.44–1.00)
Glucose, Bld: 216 mg/dL — ABNORMAL HIGH (ref 70–99)
Potassium: 2.6 mmol/L — CL (ref 3.5–5.1)
Sodium: 140 mmol/L (ref 135–145)
Total Protein: 7.8 g/dL (ref 6.5–8.1)

## 2017-11-29 LAB — SALICYLATE LEVEL: Salicylate Lvl: <7 mg/dL (ref 2.8–30.0)

## 2017-11-29 LAB — ETHANOL: Alcohol, Ethyl (B): <10 mg/dL (ref <10)

## 2017-11-29 LAB — ACETAMINOPHEN LEVEL: Acetaminophen (Tylenol), Serum: <10 ug/mL — ABNORMAL LOW (ref 10–30)

## 2017-11-29 LAB — CBG MONITORING, ED: GLUCOSE-CAPILLARY: 271 mg/dL — AB (ref 70–99)

## 2017-11-29 LAB — I-STAT BETA HCG BLOOD, ED (MC, WL, AP ONLY)

## 2017-11-29 MED ORDER — INSULIN DETEMIR 100 UNIT/ML FLEXPEN: 40.0000 [IU] | PEN_INJECTOR | Freq: Two times a day (BID) | SUBCUTANEOUS | Status: DC

## 2017-11-29 MED ORDER — NICOTINE 21 MG/24HR TD PT24
21.0000 mg | MEDICATED_PATCH | Freq: Every day | TRANSDERMAL | Status: DC
  Administered 2017-11-29 – 2017-11-30 (×2): 21 mg via TRANSDERMAL
  Filled 2017-11-29 (×2): qty 1

## 2017-11-29 MED ORDER — FLUVOXAMINE MALEATE 50 MG PO TABS
100.0000 mg | ORAL_TABLET | Freq: Three times a day (TID) | ORAL | Status: DC
  Administered 2017-11-29 – 2017-11-30 (×2): 100 mg via ORAL
  Filled 2017-11-29 (×4): qty 2

## 2017-11-29 MED ORDER — ONDANSETRON HCL 4 MG PO TABS
4.0000 mg | ORAL_TABLET | Freq: Three times a day (TID) | ORAL | Status: DC | PRN
  Administered 2017-11-30: 4 mg via ORAL
  Filled 2017-11-29: qty 1

## 2017-11-29 MED ORDER — BUSPIRONE HCL 10 MG PO TABS
15.0000 mg | ORAL_TABLET | Freq: Three times a day (TID) | ORAL | Status: DC
  Administered 2017-11-29 – 2017-11-30 (×2): 15 mg via ORAL
  Filled 2017-11-29 (×2): qty 2

## 2017-11-29 MED ORDER — NICOTINE POLACRILEX 2 MG MT GUM
2.0000 mg | CHEWING_GUM | OROMUCOSAL | Status: DC | PRN
Start: 2017-11-29 — End: 2017-11-30

## 2017-11-29 MED ORDER — INSULIN DETEMIR 100 UNIT/ML ~~LOC~~ SOLN
40.0000 [IU] | Freq: Two times a day (BID) | SUBCUTANEOUS | Status: DC
  Administered 2017-11-29 – 2017-11-30 (×2): 40 [IU] via SUBCUTANEOUS
  Filled 2017-11-29 (×2): qty 0.4

## 2017-11-29 MED ORDER — HYDROCHLOROTHIAZIDE 25 MG PO TABS
25.0000 mg | ORAL_TABLET | Freq: Every day | ORAL | Status: DC
  Administered 2017-11-29 – 2017-11-30 (×2): 25 mg via ORAL
  Filled 2017-11-29 (×3): qty 1

## 2017-11-29 MED ORDER — ACETAMINOPHEN 325 MG PO TABS: 650.0000 mg | ORAL_TABLET | ORAL | Status: DC | PRN

## 2017-11-29 MED ORDER — ALUM & MAG HYDROXIDE-SIMETH 200-200-20 MG/5ML PO SUSP: 30.0000 mL | Freq: Four times a day (QID) | ORAL | Status: DC | PRN

## 2017-11-29 MED ORDER — ARIPIPRAZOLE 10 MG PO TABS
10.0000 mg | ORAL_TABLET | Freq: Every day | ORAL | Status: DC
  Administered 2017-11-29 – 2017-11-30 (×2): 10 mg via ORAL
  Filled 2017-11-29 (×2): qty 1

## 2017-11-29 MED ORDER — GLIPIZIDE ER 10 MG PO TB24
10.0000 mg | ORAL_TABLET | Freq: Two times a day (BID) | ORAL | Status: DC
  Administered 2017-11-30: 10 mg via ORAL
  Filled 2017-11-29: qty 1

## 2017-11-29 MED ORDER — POTASSIUM CHLORIDE CRYS ER 20 MEQ PO TBCR
40.0000 meq | EXTENDED_RELEASE_TABLET | Freq: Once | ORAL | Status: AC
  Administered 2017-11-29: 40 meq via ORAL
  Filled 2017-11-29: qty 2

## 2017-11-29 NOTE — BH Assessment (Signed)
Tele Assessment Note   Patient Name: [name] MRN: [id number] Referring Physician: Dr. Aletta Edouard Location of Patient: [facility] Location of Provider: [facility] is an 27 y.o. female.  -Clinician reviewed note by Dr. Melina Copa.  [name] is a 27 y.o. female.  She called the police saying she was suicidal and they brought her here for evaluation.  She states her plan is to overdose on medication because she is upset about some relationship she is in.  She feels she needs to be inpatient and go to a specific place in [place].  She states she is suicide attempts in the past with overdoses.  Patient says that she has been having problems with her OCD (shaves her head) and feels that her medications are not working properly.  Patient says that she has been having thoughts of overdosing "on my depression medications."  Patient has had previous suicide attempts.  Patient denies any HI or A/V hallucinations.    Patient says that she is depressed because her medications are not helping her OCD.  She is also depressed because her boyfriend is talking to his ex-wife again and she is afraid he is going to step out on her.  Patient is also afraid to stay by herself in her apartment because it "is not in a safe neighborhood."  She says she sleeps with two night lights on.  She would prefer to live in a group home.  Patient is followed by [facility].  She complains that they did come out today and brought some of her medications but they did not have all of them.  They told her they would be back with the rest but she says they didn't.    Patient has been to [facility] and other local psychiatric facilities multiple times.  She is followed by  [facility].  -Clinician discussed patient care with Jinny Blossom, NP.  She recommends patient stay at [facility] overnight and be seen by psychiatry in AM.  Clinician informed Dr. Melina Copa of disposition.  Diagnosis:  F31.32 Bipolar 1 d/o most recent episode moderate; F41.1 Generalized anxiety d/o  Past Medical History:  Past Medical History:  Diagnosis Date  . Anxiety   . Bipolar 1 disorder ([place])   . Cognitive deficits   . Depression   . Diabetes mellitus without complication ([place])   . Hypertension   . Mental disorder   . Obesity     Past Surgical History:  Procedure Laterality Date  . CESAREAN SECTION    . CESAREAN SECTION N/A 04/25/2013   Procedure: REPEAT CESAREAN SECTION;  Surgeon: [name], MD;  Location: [facility];  Service: Obstetrics;  Laterality: N/A;  . MASS EXCISION N/A 06/03/2012   Procedure: EXCISION MASS;  Surgeon: [name], MD;  Location: [facility];  Service: ENT;  Laterality: N/A;  Excision uvula mass  . TONSILLECTOMY N/A 06/03/2012   Procedure: TONSILLECTOMY;  Surgeon: [name], MD;  Location: [facility];  Service: ENT;  Laterality: N/A;  . TONSILLECTOMY      Family History:  Family History  Problem Relation Age of Onset  . Hypertension Mother   . Diabetes Father     Social History:  reports that she has been smoking cigarettes.  She has a 33.00 pack-year smoking history. She has never used smokeless tobacco. She reports that she does not drink alcohol or use drugs.  Additional Social History:  Alcohol / Drug  Use Pain Medications: See PTA medication list Prescriptions: See PTA medication list. Over the Counter: See PTA medication list History of alcohol / drug use?: No history of alcohol / drug abuse  CIWA: CIWA-Ar BP: (!) 137/99 Pulse Rate: 97 COWS:    Allergies:  Allergies  Allergen Reactions  . Wellbutrin [Bupropion] Shortness Of Breath  . Omnipaque [Iohexol] Swelling and Other (See Comments)    Reaction:  Eye swelling  . Penicillins Hives and Other (See Comments)    Has patient had a PCN reaction causing immediate rash, facial/tongue/throat swelling, SOB or lightheadedness with hypotension: No Has patient had a  PCN reaction causing severe rash involving mucus membranes or skin necrosis: No Has patient had a PCN reaction that required hospitalization No Has patient had a PCN reaction occurring within the last 10 years: No If all of the above answers are "NO", then may proceed with Cephalosporin use.  . Depakote Er [Divalproex Sodium Er] Nausea And Vomiting    Home Medications:  (Not in a hospital admission)  OB/GYN Status:  No LMP recorded. Patient has had an implant.  General Assessment Data Assessment unable to be completed: Yes Reason for not completing assessment: Pt not ready to be seen per nurse Location of Assessment: [facility] TTS Assessment: In system Is this a Tele or Face-to-Face Assessment?: Tele Assessment Is this an Initial Assessment or a Re-assessment for this encounter?: Initial Assessment Marital status: Single Is patient pregnant?: No Pregnancy Status: No Living Arrangements: Alone Can pt return to current living arrangement?: Yes Admission Status: Voluntary Is patient capable of signing voluntary admission?: Yes Referral Source: Self/Family/Friend(Pt called police for help getting to hospital.) Insurance type: MCD     Crisis Care Plan Living Arrangements: Alone Name of Psychiatrist: [facility] Name of Therapist: [facility]  Education Status Is patient currently in school?: No Highest grade of school patient has completed: 11th grade Is the patient employed, unemployed or receiving disability?: Receiving disability income  Risk to self with the past 6 months Suicidal Ideation: Yes-Currently Present Has patient been a risk to self within the past 6 months prior to admission? : Yes Suicidal Intent: Yes-Currently Present Has patient had any suicidal intent within the past 6 months prior to admission? : Yes Is patient at risk for suicide?: Yes Suicidal Plan?: Yes-Currently Present Has patient had any suicidal plan within the past 6 months prior to admission? :  Yes Specify Current Suicidal Plan: Overdose on her medications Access to Means: Yes Specify Access to Suicidal Means: "Overdose on my depression meds." What has been your use of drugs/alcohol within the last 12 months?: N/A Previous Attempts/Gestures: Yes How many times?: (Multiple) Other Self Harm Risks: None reported Triggers for Past Attempts: Unpredictable Intentional Self Injurious Behavior: None Comment - Self Injurious Behavior: N/A Family Suicide History: No Recent stressful life event(s): Turmoil (Comment)(Feels meds aren't working.  Worried about bf stepping out ) Persecutory voices/beliefs?: Yes Depression: Yes Depression Symptoms: Despondent, Loss of interest in usual pleasures, Feeling worthless/self pity, Insomnia Substance abuse history and/or treatment for substance abuse?: No Suicide prevention information given to non-admitted patients: Not applicable  Risk to Others within the past 6 months Homicidal Ideation: No Does patient have any lifetime risk of violence toward others beyond the six months prior to admission? : No Thoughts of Harm to Others: No Current Homicidal Intent: No Current Homicidal Plan: No Access to Homicidal Means: No Identified Victim: No one History of harm to others?: No Assessment of Violence:  None Noted Violent Behavior Description: None reported Does patient have access to weapons?: No Criminal Charges Pending?: No Does patient have a court date: No Is patient on probation?: No  Psychosis Hallucinations: None noted Delusions: None noted  Mental Status Report Appearance/Hygiene: In scrubs, Unremarkable(Shaving her head bald, part of her OCD) Eye Contact: Good Motor Activity: Freedom of movement, Unremarkable Speech: Logical/coherent, Loud Level of Consciousness: Alert Mood: Depressed, Anxious Affect: Anxious, Sad Anxiety Level: Minimal Thought Processes: Coherent, Relevant Judgement: Partial Orientation: Person, Place, Time,  Situation Obsessive Compulsive Thoughts/Behaviors: Moderate(Obsessed with shaving head.)  Cognitive Functioning Concentration: Decreased Memory: Recent Intact, Remote Intact Is patient IDD: No Is patient DD?: Unknown I IQ score available?: No Insight: Poor Impulse Control: Poor Appetite: Fair Have you had any weight changes? : Loss Amount of the weight change? (lbs): (20 lbs recently lost.) Sleep: Decreased Total Hours of Sleep: (<5H/D) Vegetative Symptoms: None  ADLScreening [facility] Assessment Services) Patient's cognitive ability adequate to safely complete daily activities?: Yes Patient able to express need for assistance with ADLs?: Yes Independently performs ADLs?: Yes (appropriate for developmental age)  Prior Inpatient Therapy Prior Inpatient Therapy: Yes Prior Therapy Dates: MULTIPLE PER PT RECORD Prior Therapy Facilty/Provider(s): [facility], [facility], [facility] Reason for Treatment: SI, BIPOLAR  Prior Outpatient Therapy Prior Outpatient Therapy: Yes Prior Therapy Dates: CURRENT Prior Therapy Facilty/Provider(s): [facility] Reason for Treatment: MED MGMT & THERAPY Does patient have an ACCT team?: Yes Does patient have Intensive In-House Services?  : No Does patient have [facility] services? : No Does patient have P4CC services?: No  ADL Screening (condition at time of admission) Patient's cognitive ability adequate to safely complete daily activities?: Yes Is the patient deaf or have difficulty hearing?: No Does the patient have difficulty seeing, even when wearing glasses/contacts?: No Does the patient have difficulty concentrating, remembering, or making decisions?: No Patient able to express need for assistance with ADLs?: Yes Does the patient have difficulty dressing or bathing?: No Independently performs ADLs?: Yes (appropriate for developmental age) Does the patient have difficulty walking or climbing stairs?: No Weakness of Legs: None Weakness of Arms/Hands:  None       Abuse/Neglect Assessment (Assessment to be complete while patient is alone) Abuse/Neglect Assessment Can Be Completed: Yes Physical Abuse: Denies Verbal Abuse: Denies Sexual Abuse: Denies Exploitation of patient/patient's resources: Denies Self-Neglect: Denies     Regulatory affairs officer (For Healthcare) Does Patient Have a Medical Advance Directive?: No Would patient like information on creating a medical advance directive?: No - Patient declined          Disposition:  Disposition Initial Assessment Completed for this Encounter: Yes Patient referred to: Other (Comment)(To be reviewed by NP)  This service was provided via telemedicine using a 2-way, interactive audio and video technology.  Names of all persons participating in this telemedicine service and their role in this encounter. Name: [name] Role: patient  Name: Curlene Dolphin, M.S. LCAS QP Role: clinician  Name: Role:   Name:  Role:     Raymondo Band 11/29/2017 8:11 PM

## 2017-11-29 NOTE — ED Notes (Signed)
Specimen cup provided. Pt encouraged to provide urine per MD order  

## 2017-11-29 NOTE — ED Notes (Signed)
Pt BIB [facility]--Pt called out stating she is "out of her head" and "wanted to kill herself."

## 2017-11-29 NOTE — ED Notes (Signed)
TTS in progress 

## 2017-11-29 NOTE — BH Assessment (Signed)
[facility] Assessment Progress Note   Clinician called about getting teleassessment machine set up for patient.  Nurse Caryl Pina asked to wait 30 min until patient was ready.

## 2017-11-29 NOTE — ED Notes (Signed)
Pt observed removing clothing (shirt, bra, pants, shoes) and personal gold bookbag--all items placed in labeled belongings bag and scanned by security. Adrianne RN advised. [employer]

## 2017-11-29 NOTE — ED Notes (Signed)
Bed: [facility]PT4 Expected date:  Expected time:  Means of arrival:  Comments: 

## 2017-11-29 NOTE — ED Notes (Signed)
Pt to room #27. Pt loud, endorsing SI. Enforms this nurse she called [employer] to bring her to the hospital. Encouragement and support provided. Will continue to monitor.

## 2017-11-29 NOTE — ED Notes (Signed)
Turkey sandwich and ginger ale given 

## 2017-11-29 NOTE — ED Triage Notes (Signed)
Pt presents w/ [employer].  C/o SI w/ plan to overdose.  Sts she has been taking her medications as prescribed.  Sts "I want to be admitted in [place].  I want to be in [facility]."  Further, she reports "I''ve been seeing a guy, but he's been talking to his wife.  I need to be in a group home, because I don't get along with men."  Pt tearful during triage.

## 2017-11-29 NOTE — ED Provider Notes (Signed)
[facility] DEPT Provider Note   CSN: [id number] Arrival date & time: 11/29/17  1651     History   Chief Complaint Chief Complaint  Patient presents with  . Suicidal    HPI [name] is a 27 y.o. female.  She called the police saying she was suicidal and they brought her here for evaluation.  She states her plan is to overdose on medication because she is upset about some relationship she is in.  She feels she needs to be inpatient and go to a specific place in [place].  She states she is suicide attempts in the past with overdoses.  She denies any specific medical complaints.  She denies that she took anything during this episode.  No alcohol no drugs.  She does smoke cigarettes.  She is not seeing things or hearing any things.  The history is provided by the patient.  Mental Health Problem  Presenting symptoms: suicidal thoughts and suicidal threats   Patient accompanied by:  Law enforcement Degree of incapacity (severity):  Severe Onset quality:  Unable to specify Timing:  Constant Progression:  Worsening Chronicity:  Recurrent Context: stressful life event   Context: not alcohol use and not drug abuse   Associated symptoms: no abdominal pain and no chest pain     Past Medical History:  Diagnosis Date  . Anxiety   . Bipolar 1 disorder ([place])   . Cognitive deficits   . Depression   . Diabetes mellitus without complication ([place])   . Hypertension   . Mental disorder   . Obesity     Patient Active Problem List   Diagnosis Date Noted  . Psychosis ([place])   . 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 LGSIL on Pap smear of cervix 02/01/2017  . OCD (obsessive compulsive disorder) 10/05/2016  . Major depressive disorder, recurrent episode, mild ([place]) 05/04/2016  . 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 Learning disability 07/18/2015  . 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 Diabetes  mellitus ([place]) 07/18/2015  . MDD (major depressive disorder), recurrent, severe, with psychosis ([place]) 07/18/2015  . Hyperlipidemia 07/18/2015  . Severe episode of recurrent major depressive disorder, without psychotic features ([place])   . Drug overdose   . Cognitive deficits 10/12/2012  . Generalized anxiety disorder 06/28/2012    Past Surgical History:  Procedure Laterality Date  . CESAREAN SECTION    . CESAREAN SECTION N/A 04/25/2013   Procedure: REPEAT CESAREAN SECTION;  Surgeon: [name], MD;  Location: [facility];  Service: Obstetrics;  Laterality: N/A;  . MASS EXCISION N/A 06/03/2012   Procedure: EXCISION MASS;  Surgeon: [name], MD;  Location: [facility];  Service: ENT;  Laterality: N/A;  Excision uvula mass  . TONSILLECTOMY N/A 06/03/2012   Procedure: TONSILLECTOMY;  Surgeon: [name], MD;  Location: [facility];  Service: ENT;  Laterality: N/A;  . TONSILLECTOMY       OB History    Gravida  3   Para  3   Term  3   Preterm  0   AB  0   Living  3     SAB  0   TAB  0   Ectopic  0   Multiple  0   Live Births  3            Home Medications    Prior to Admission medications   Medication Sig Start Date End Date Taking? Authorizing  Provider  ARIPiprazole (ABILIFY) 10 MG tablet Take 10 mg by mouth daily.    [provider]  busPIRone (BUSPAR) 15 MG tablet Take 1 tablet (15 mg total) by mouth 2 (two) times daily. For anxiety Patient taking differently: Take 15 mg by mouth 3 (three) times daily. For anxiety 10/06/16   Lindell Spar I, NP  DULoxetine (CYMBALTA) 20 MG capsule Take 20 mg by mouth 2 (two) times daily.     [provider]  etonogestrel (NEXPLANON) 68 MG IMPL implant 1 each by Subdermal route once. Implanted Summer 2018    [provider]  fluvoxaMINE (LUVOX) 50 MG tablet Take 1 tablet (50 mg total) by mouth at bedtime. Patient taking differently: Take 100 mg by mouth 2 (two) times  daily.  10/10/17   Patrecia Pour, NP  glipiZIDE (GLUCOTROL) 10 MG tablet Take 10 mg by mouth 2 (two) times daily before a meal.    [provider]  hydrochlorothiazide (HYDRODIURIL) 25 MG tablet Take 25 mg by mouth daily.    [provider]  hydrOXYzine (VISTARIL) 25 MG capsule Take 25 mg by mouth 2 (two) times daily. 10/27/17   [provider]  Insulin Detemir (LEVEMIR FLEXTOUCH) 100 UNIT/ML Pen Inject 40 Units into the skin 2 (two) times daily after a meal.    [provider]  paliperidone (INVEGA SUSTENNA) 234 MG/1.5ML SUSP injection Inject 234 mg every 30 (thirty) days into the muscle.    [provider]  paliperidone (INVEGA) 3 MG 24 hr tablet Take 1 tablet (3 mg total) by mouth daily. Patient not taking: Reported on 10/25/2017 10/20/17   Patrecia Pour, NP    Family History Family History  Problem Relation Age of Onset  . Hypertension Mother   . Diabetes Father     Social History Social History   Tobacco Use  . Smoking status: Current Every Day Smoker    Packs/day: 3.00    Years: 11.00    Pack years: 33.00    Types: Cigarettes  . Smokeless tobacco: Never Used  Substance Use Topics  . Alcohol use: No  . Drug use: No    Comment: Patient denies     Allergies   Wellbutrin [bupropion]; Omnipaque [iohexol]; Penicillins; and Depakote er [divalproex sodium er]   Review of Systems Review of Systems  Constitutional: Negative for fever.  HENT: Negative for sore throat.   Eyes: Negative for visual disturbance.  Respiratory: Negative for shortness of breath.   Cardiovascular: Negative for chest pain.  Gastrointestinal: Negative for abdominal pain.  Genitourinary: Negative for dysuria.  Musculoskeletal: Negative for neck pain.  Skin: Negative for rash.  Neurological: Negative for syncope.  Psychiatric/Behavioral: Positive for suicidal ideas.     Physical Exam Updated Vital Signs BP (!) 137/99 (BP Location: Left Arm)   Pulse 97    Temp 98.9 F (37.2 C) (Oral)   Resp 18   SpO2 98%   Physical Exam  Constitutional: She is oriented to person, place, and time. She appears well-developed and well-nourished.  HENT:  Head: Normocephalic and atraumatic.  Eyes: Conjunctivae are normal.  Neck: Neck supple.  Cardiovascular: Normal rate, regular rhythm and normal heart sounds.  Pulmonary/Chest: Effort normal. No stridor. She has no wheezes.  Abdominal: Soft. She exhibits no mass. There is no tenderness. There is no guarding.  Musculoskeletal: She exhibits no deformity.  Neurological: She is alert and oriented to person, place, and time. Gait normal. GCS eye subscore is 4. GCS verbal subscore  is 5. GCS motor subscore is 6.  Skin: Skin is warm and dry. Capillary refill takes less than 2 seconds.  Psychiatric: She has a normal mood and affect.     ED Treatments / Results  Labs (all labs ordered are listed, but only abnormal results are displayed) Labs Reviewed  COMPREHENSIVE METABOLIC PANEL - Abnormal; Notable for the following components:      Result Value   Potassium 2.6 (*)    Glucose, Bld 216 (*)    All other components within normal limits  ACETAMINOPHEN LEVEL - Abnormal; Notable for the following components:   Acetaminophen (Tylenol), Serum <10 (*)    All other components within normal limits  CBG MONITORING, ED - Abnormal; Notable for the following components:   Glucose-Capillary 271 (*)    All other components within normal limits  ETHANOL  SALICYLATE LEVEL  CBC  RAPID URINE DRUG SCREEN, HOSP PERFORMED  I-STAT BETA HCG BLOOD, ED ([facility], [facility], [facility] ONLY)    EKG None  Radiology No results found.  Procedures Procedures (including critical care time)  Medications Ordered in ED Medications  potassium chloride SA (K-DUR,KLOR-CON) CR tablet 40 mEq (40 mEq Oral Given 11/29/17 2045)  asenapine (SAPHRIS) sublingual tablet 10 mg (10 mg Sublingual Given 11/30/17 0957)     Initial Impression / Assessment and Plan  / ED Course  I have reviewed the triage vital signs and the nursing notes.  Pertinent labs & imaging results that were available during my care of the patient were reviewed by me and considered in my medical decision making (see chart for details).  Clinical Course as of Dec 01 1926  Mon Nov 29, 2017  2050 Received a call from behavioral health with the recommendation that she stay overnight and have an evaluation by psychiatry in the morning.   [MB]    Clinical Course User Index [MB] Hayden Rasmussen, MD      Final Clinical Impressions(s) / ED Diagnoses   Final diagnoses:  Disorganized schizophrenia ([place])  Severe episode of recurrent major depressive disorder, without psychotic features [facility])    ED Discharge Orders    None       Hayden Rasmussen, MD 11/30/17 Kathyrn Drown

## 2017-11-30 ENCOUNTER — Inpatient Hospital Stay (HOSPITAL_COMMUNITY)
Admission: AD | Admit: 2017-11-30 | Discharge: 2017-12-06 | DRG: 885 | Disposition: A | Discharge status: Home / Self Care | Payer: Medicaid Other | Source: Intra-hospital | Attending: Psychiatry | Admitting: Psychiatry

## 2017-11-30 ENCOUNTER — Encounter (HOSPITAL_COMMUNITY): Payer: Self-pay

## 2017-11-30 DIAGNOSIS — R451 Restlessness and agitation: Secondary | ICD-10-CM | POA: Diagnosis present

## 2017-11-30 DIAGNOSIS — R45851 Suicidal ideations: Secondary | ICD-10-CM | POA: Diagnosis present

## 2017-11-30 DIAGNOSIS — G4700 Insomnia, unspecified: Secondary | ICD-10-CM | POA: Diagnosis present

## 2017-11-30 DIAGNOSIS — F411 Generalized anxiety disorder: Secondary | ICD-10-CM | POA: Diagnosis present

## 2017-11-30 DIAGNOSIS — F17210 Nicotine dependence, cigarettes, uncomplicated: Secondary | ICD-10-CM | POA: Diagnosis present

## 2017-11-30 DIAGNOSIS — F201 Disorganized schizophrenia: Principal | ICD-10-CM | POA: Diagnosis present

## 2017-11-30 DIAGNOSIS — Z79899 Other long term (current) drug therapy: Secondary | ICD-10-CM

## 2017-11-30 DIAGNOSIS — F3132 Bipolar disorder, current episode depressed, moderate: Secondary | ICD-10-CM | POA: Diagnosis not present

## 2017-11-30 DIAGNOSIS — R4183 Borderline intellectual functioning: Secondary | ICD-10-CM | POA: Diagnosis present

## 2017-11-30 DIAGNOSIS — Z888 Allergy status to other drugs, medicaments and biological substances status: Secondary | ICD-10-CM | POA: Diagnosis not present

## 2017-11-30 DIAGNOSIS — E669 Obesity, unspecified: Secondary | ICD-10-CM | POA: Diagnosis present

## 2017-11-30 DIAGNOSIS — Z6837 Body mass index (BMI) 37.0-37.9, adult: Secondary | ICD-10-CM | POA: Diagnosis not present

## 2017-11-30 DIAGNOSIS — Z880 Allergy status to penicillin: Secondary | ICD-10-CM | POA: Diagnosis not present

## 2017-11-30 DIAGNOSIS — Z915 Personal history of self-harm: Secondary | ICD-10-CM

## 2017-11-30 DIAGNOSIS — E785 Hyperlipidemia, unspecified: Secondary | ICD-10-CM | POA: Diagnosis present

## 2017-11-30 DIAGNOSIS — I10 Essential (primary) hypertension: Secondary | ICD-10-CM | POA: Diagnosis present

## 2017-11-30 DIAGNOSIS — Z7984 Long term (current) use of oral hypoglycemic drugs: Secondary | ICD-10-CM

## 2017-11-30 DIAGNOSIS — F429 Obsessive-compulsive disorder, unspecified: Secondary | ICD-10-CM | POA: Diagnosis present

## 2017-11-30 DIAGNOSIS — F419 Anxiety disorder, unspecified: Secondary | ICD-10-CM | POA: Diagnosis not present

## 2017-11-30 DIAGNOSIS — F422 Mixed obsessional thoughts and acts: Secondary | ICD-10-CM | POA: Diagnosis not present

## 2017-11-30 DIAGNOSIS — F3130 Bipolar disorder, current episode depressed, mild or moderate severity, unspecified: Secondary | ICD-10-CM | POA: Diagnosis present

## 2017-11-30 DIAGNOSIS — E119 Type 2 diabetes mellitus without complications: Secondary | ICD-10-CM | POA: Diagnosis present

## 2017-11-30 LAB — GLUCOSE, CAPILLARY: Glucose-Capillary: 228 mg/dL — ABNORMAL HIGH (ref 70–99)

## 2017-11-30 MED ORDER — HYDROXYZINE HCL 25 MG PO TABS
25.0000 mg | ORAL_TABLET | Freq: Three times a day (TID) | ORAL | Status: DC | PRN
  Administered 2017-11-30 – 2017-12-05 (×8): 25 mg via ORAL
  Filled 2017-11-30 (×9): qty 1

## 2017-11-30 MED ORDER — PNEUMOCOCCAL VAC POLYVALENT 25 MCG/0.5ML IJ INJ: 0.5000 mL | INJECTION | INTRAMUSCULAR | Status: DC

## 2017-11-30 MED ORDER — FLUVOXAMINE MALEATE 100 MG PO TABS
100.0000 mg | ORAL_TABLET | Freq: Three times a day (TID) | ORAL | Status: DC
  Administered 2017-11-30 – 2017-12-06 (×18): 100 mg via ORAL
  Filled 2017-11-30 (×24): qty 1

## 2017-11-30 MED ORDER — TRAZODONE HCL 100 MG PO TABS
100.0000 mg | ORAL_TABLET | Freq: Every evening | ORAL | Status: DC | PRN
  Administered 2017-11-30 – 2017-12-04 (×4): 100 mg via ORAL
  Filled 2017-11-30 (×4): qty 1

## 2017-11-30 MED ORDER — NICOTINE 21 MG/24HR TD PT24
21.0000 mg | MEDICATED_PATCH | Freq: Every day | TRANSDERMAL | Status: DC
  Filled 2017-11-30 (×3): qty 1

## 2017-11-30 MED ORDER — ONDANSETRON HCL 4 MG PO TABS: 4.0000 mg | ORAL_TABLET | Freq: Three times a day (TID) | ORAL | Status: DC | PRN

## 2017-11-30 MED ORDER — MAGNESIUM HYDROXIDE 400 MG/5ML PO SUSP: 30.0000 mL | Freq: Every day | ORAL | Status: DC | PRN

## 2017-11-30 MED ORDER — HYDROCHLOROTHIAZIDE 25 MG PO TABS
25.0000 mg | ORAL_TABLET | Freq: Every day | ORAL | Status: DC
Start: 2017-12-01 — End: 2017-12-06
  Administered 2017-12-01 – 2017-12-06 (×6): 25 mg via ORAL
  Filled 2017-11-30 (×8): qty 1

## 2017-11-30 MED ORDER — INSULIN DETEMIR 100 UNIT/ML ~~LOC~~ SOLN
40.0000 [IU] | Freq: Two times a day (BID) | SUBCUTANEOUS | Status: DC
  Administered 2017-11-30 – 2017-12-01 (×3): 40 [IU] via SUBCUTANEOUS

## 2017-11-30 MED ORDER — BUSPIRONE HCL 15 MG PO TABS
15.0000 mg | ORAL_TABLET | Freq: Three times a day (TID) | ORAL | Status: DC
  Administered 2017-11-30 – 2017-12-06 (×18): 15 mg via ORAL
  Filled 2017-11-30 (×24): qty 1

## 2017-11-30 MED ORDER — ASENAPINE MALEATE 5 MG SL SUBL
10.0000 mg | SUBLINGUAL_TABLET | Freq: Once | SUBLINGUAL | Status: AC
  Administered 2017-11-30: 10 mg via SUBLINGUAL
  Filled 2017-11-30: qty 2

## 2017-11-30 MED ORDER — ALUM & MAG HYDROXIDE-SIMETH 200-200-20 MG/5ML PO SUSP: 30.0000 mL | ORAL | Status: DC | PRN

## 2017-11-30 MED ORDER — ARIPIPRAZOLE 10 MG PO TABS
10.0000 mg | ORAL_TABLET | Freq: Every day | ORAL | Status: DC
  Administered 2017-12-01: 10 mg via ORAL
  Filled 2017-11-30 (×3): qty 1

## 2017-11-30 MED ORDER — ACETAMINOPHEN 325 MG PO TABS: 650.0000 mg | ORAL_TABLET | Freq: Four times a day (QID) | ORAL | Status: DC | PRN

## 2017-11-30 MED ORDER — GLIPIZIDE ER 10 MG PO TB24
10.0000 mg | ORAL_TABLET | Freq: Two times a day (BID) | ORAL | Status: DC
  Administered 2017-11-30 – 2017-12-01 (×3): 10 mg via ORAL
  Filled 2017-11-30 (×6): qty 1

## 2017-11-30 NOTE — Progress Notes (Signed)
Nursing note 7p-7a  Pt observed isolating in room this shift. Displayed a flat affect and irritable mood upon interaction with this Probation officer. Pt denies pain ,denies SI/HI, and also denies any audio or visual hallucinations at this time. Pt stated that she has compulsions and feels like her anxiety is increasing. Pt also complains of insomnia. Prn vistaril and trazodone given per MD order. See MAR for prn medication administration.   Pt is able to verbally contract for safety with this RN. Pt is now resting in bed with eyes closed, with no signs or symptoms of pain or distress noted. Pt continues to remain safe on the unit and is observed by rounding every 15 min. RN will continue to monitor.

## 2017-11-30 NOTE — Progress Notes (Signed)
Admission Note: Patient is an 27 year old female admitted to the unit for suicidal ideation with plan to " blow her brain out" or to overdose.  Presents with flat affect and depressed mood.  vomited yellow color liquid during admission process.  States she is here to get help for her OCD and keep her mind focus.  Reports stressors as relationship problem and her kids.  Admission plan of care reviewed and consent signed.  Skin assessment and personal belongings completed.  No contraband found.  Skin is dry and intact.  Periorbital edema noted.  Patient oriented to the unit, staff and room.  Transferred to the unit with wheel chair due to fatigue.  Patient able to ambulate on the unit without assistance.  Routine safety checks initiated.  Patient is safe on the unit.

## 2017-11-30 NOTE — ED Notes (Signed)
Pt continuously pacing hallway. Pt appears manic. Pt continuously stating "Got to get out of my brain. Got to get out of my brain." Pt also standing at nurses station speaking with nursing staff. Speech is pressured.

## 2017-11-30 NOTE — Plan of Care (Signed)
  Problem: Education: Goal: Knowledge of [facility] Education information/materials will improve 11/30/2017 2202 by Hermelinda Medicus, RN Outcome: Progressing 11/30/2017 2128 by Hermelinda Medicus, RN Outcome: Progressing Goal: Emotional status will improve 11/30/2017 2202 by Hermelinda Medicus, RN Outcome: Progressing 11/30/2017 2128 by Hermelinda Medicus, RN Outcome: Progressing Goal: Mental status will improve 11/30/2017 2202 by Hermelinda Medicus, RN Outcome: Progressing 11/30/2017 2128 by Hermelinda Medicus, RN Outcome: Progressing Goal: Verbalization of understanding the information provided will improve 11/30/2017 2202 by Hermelinda Medicus, RN Outcome: Progressing 11/30/2017 2128 by Hermelinda Medicus, RN Outcome: Progressing   Problem: Health Behavior/Discharge Planning: Goal: Identification of resources available to assist in meeting health care needs will improve 11/30/2017 2202 by Hermelinda Medicus, RN Outcome: Progressing 11/30/2017 2128 by Hermelinda Medicus, RN Outcome: Progressing   Problem: Medication: Goal: Compliance with prescribed medication regimen will improve 11/30/2017 2202 by Hermelinda Medicus, RN Outcome: Progressing 11/30/2017 2128 by Hermelinda Medicus, RN Outcome: Progressing   Problem: Activity: Goal: Interest or engagement in leisure activities will improve 11/30/2017 2202 by Hermelinda Medicus, RN Outcome: Progressing 11/30/2017 2128 by Hermelinda Medicus, RN Outcome: Progressing Goal: Imbalance in normal sleep/wake cycle will improve 11/30/2017 2202 by Hermelinda Medicus, RN Outcome: Progressing 11/30/2017 2128 by Hermelinda Medicus, RN Outcome: Progressing   Problem: Activity: Goal: Will identify at least one activity in which they can participate 11/30/2017 2202 by Hermelinda Medicus, RN Outcome: Progressing 11/30/2017 2128 by Hermelinda Medicus, RN Outcome: Progressing   Problem: Coping: Goal: Ability to identify and develop effective coping  behavior will improve 11/30/2017 2202 by Hermelinda Medicus, RN Outcome: Progressing 11/30/2017 2128 by Hermelinda Medicus, RN Outcome: Progressing Goal: Ability to interact with others will improve 11/30/2017 2202 by Hermelinda Medicus, RN Outcome: Progressing 11/30/2017 2128 by Hermelinda Medicus, RN Outcome: Progressing Goal: Demonstration of participation in decision-making regarding own care will improve 11/30/2017 2202 by Hermelinda Medicus, RN Outcome: Progressing 11/30/2017 2128 by Hermelinda Medicus, RN Outcome: Progressing Goal: Ability to use eye contact when communicating with others will improve 11/30/2017 2202 by Hermelinda Medicus, RN Outcome: Progressing 11/30/2017 2128 by Hermelinda Medicus, RN Outcome: Progressing

## 2017-11-30 NOTE — ED Notes (Signed)
Bed: [facility] Expected date:  Expected time:  Means of arrival:  Comments: Hold for rm 27

## 2017-11-30 NOTE — BH Assessment (Signed)
[facility] Assessment Progress Note  Per Buford Dresser, DO, this pt requires psychiatric hospitalization at this time.  Letitia Libra, RN, [facility] has assigned pt to [facility] Rm 502-2.  Pt has signed Voluntary Admission and Consent for Treatment, as well as Consent to Release Information to her mother and to the [facility] ACT Team, and a notification call has been placed to the provider.  Signed forms have been faxed to [facility].  Pt's nurse, Nena Jordan, has been notified, and agrees to send original paperwork along with pt via [facility], and to call report to [phone].  Jalene Mullet, [place] Coordinator [phone]

## 2017-11-30 NOTE — Plan of Care (Signed)
  Problem: Education: Goal: Knowledge of [facility] Education information/materials will improve Outcome: Progressing Goal: Emotional status will improve Outcome: Progressing Goal: Mental status will improve Outcome: Progressing Goal: Verbalization of understanding the information provided will improve Outcome: Progressing   Problem: Health Behavior/Discharge Planning: Goal: Identification of resources available to assist in meeting health care needs will improve Outcome: Progressing   Problem: Medication: Goal: Compliance with prescribed medication regimen will improve Outcome: Progressing   Problem: Activity: Goal: Interest or engagement in leisure activities will improve Outcome: Progressing Goal: Imbalance in normal sleep/wake cycle will improve Outcome: Progressing   Problem: Activity: Goal: Will identify at least one activity in which they can participate Outcome: Progressing   Problem: Coping: Goal: Ability to identify and develop effective coping behavior will improve Outcome: Progressing Goal: Ability to interact with others will improve Outcome: Progressing Goal: Demonstration of participation in decision-making regarding own care will improve Outcome: Progressing Goal: Ability to use eye contact when communicating with others will improve Outcome: Progressing

## 2017-11-30 NOTE — ED Notes (Signed)
Pt discharged safely with [place] driver.  All belongings were sent with patient.  Pt was calm and cooperative.

## 2017-11-30 NOTE — ED Notes (Signed)
Pt belongings locked up in locker #37. Belongings: Gold colored back pack-misc items-unopened, bra, pajama pants (blue/multicolored/floral), blue t-shirt, gray shoes. Belongings itemized on belongings sheet located in pt chart at nurses station. Pt is unable to sign belongings sheet at this time due to pts manic behavior and obsessive thought pattern. Pts RN signed along with this Probation officer confirming that belongings have been itemized and locked up.

## 2017-11-30 NOTE — Progress Notes (Signed)
Did not attend group 

## 2017-11-30 NOTE — ED Notes (Signed)
Pt c/o nausea. This RN made aware.

## 2017-11-30 NOTE — ED Notes (Addendum)
Pt keeps repeating, " I got to gets out of my brains!"  Pt requesting he medications, then turns around and states, "they dont help."   Pt then states, "I got to get outs of my brains, I want to blow my brains out!! I need a gun, or a knife or meds to overdose on!"  "people taking advantage of me because I am young and slow!".

## 2017-11-30 NOTE — ED Notes (Signed)
Pt oriented to room and unit.  Pt is very hyperactive.  She keeps obsessing about shaving her head and getting her brain to quiet down.  She is pacing the unit.  15 minute checks and video monitoring in place.

## 2017-11-30 NOTE — Consult Note (Signed)
[place] [facility] Consult   Reason for Consult:  SI Referring Physician:  EDP Patient Identification: [name] MRN:  [id number] Principal Diagnosis: Moderate bipolar I disorder, most recent episode depressed [facility]) Diagnosis:   Patient Active Problem List   Diagnosis Date Noted  . Psychosis ([place]) [F29]   . Adjustment disorder with mixed disturbance of emotions and conduct [F43.25] 08/03/2017  . LGSIL on Pap smear of cervix [R87.612] 02/01/2017  . OCD (obsessive compulsive disorder) [F42.9] 10/05/2016  . Major depressive disorder, recurrent episode, mild ([place]) [F33.0] 05/04/2016  . 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 Learning disability [F81.9] 07/18/2015  . 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 Diabetes mellitus ([place]) [E11.9] 07/18/2015  . MDD (major depressive disorder), recurrent, severe, with psychosis ([place]) [F33.3] 07/18/2015  . Hyperlipidemia [E78.5] 07/18/2015  . Severe episode of recurrent major depressive disorder, without psychotic features ([place]) [F33.2]   . Drug overdose [T50.901A]   . Cognitive deficits [R41.89] 10/12/2012  . Generalized anxiety disorder [F41.1] 06/28/2012    Total Time spent with patient: 30 minutes  Subjective:   [name] is a 27 y.o. female patient admitted with SI.  HPI:   [name] presents to the hospital after endorsing SI to the police. She endorses SI today. She reports, "I'm read to kill myself and blow my brains out. My medications do not seem to be working. I'm not getting along with anyone." She reports that her partner had a "mental breakdown" and has been in contact with his ex-wife. She denies HI or AVH. She does not appear at her baseline today. She ruminates about shaving her hair and eyebrows in almost a disorganized manner.   Past Psychiatric History: BPAD, depression and anxiety.   Risk to Self: Suicidal Ideation: Yes-Currently Present Suicidal Intent: Yes-Currently Present Is  patient at risk for suicide?: Yes Suicidal Plan?: Yes-Currently Present Specify Current Suicidal Plan: Overdose on her medications Access to Means: Yes Specify Access to Suicidal Means: "Overdose on my depression meds." What has been your use of drugs/alcohol within the last 12 months?: N/A How many times?: (Multiple) Other Self Harm Risks: None reported Triggers for Past Attempts: Unpredictable Intentional Self Injurious Behavior: None Comment - Self Injurious Behavior: N/A Risk to Others: Homicidal Ideation: No Thoughts of Harm to Others: No Current Homicidal Intent: No Current Homicidal Plan: No Access to Homicidal Means: No Identified Victim: No one History of harm to others?: No Assessment of Violence: None Noted Violent Behavior Description: None reported Does patient have access to weapons?: No Criminal Charges Pending?: No Does patient have a court date: No Prior Inpatient Therapy: Prior Inpatient Therapy: Yes Prior Therapy Dates: MULTIPLE PER PT RECORD Prior Therapy Facilty/Provider(s): [facility], [facility], [facility] Reason for Treatment: SI, BIPOLAR Prior Outpatient Therapy: Prior Outpatient Therapy: Yes Prior Therapy Dates: CURRENT Prior Therapy Facilty/Provider(s): [facility] team Reason for Treatment: MED MGMT & THERAPY Does patient have an ACCT team?: Yes Does patient have Intensive In-House Services?  : No Does patient have [facility] services? : No Does patient have P4CC services?: No  Past Medical History:  Past Medical History:  Diagnosis Date  . Anxiety   . Bipolar 1 disorder ([place])   . Cognitive deficits   . Depression   . Diabetes mellitus without complication ([place])   . Hypertension   . Mental disorder   . Obesity     Past Surgical History:  Procedure Laterality Date  . CESAREAN SECTION    . CESAREAN SECTION N/A 04/25/2013  Procedure: REPEAT CESAREAN SECTION;  Surgeon: [name], MD;  Location: [facility];  Service: Obstetrics;  Laterality: N/A;  . MASS  EXCISION N/A 06/03/2012   Procedure: EXCISION MASS;  Surgeon: [name], MD;  Location: [place];  Service: ENT;  Laterality: N/A;  Excision uvula mass  . TONSILLECTOMY N/A 06/03/2012   Procedure: TONSILLECTOMY;  Surgeon: [name], MD;  Location: [place] [facility] [facility];  Service: ENT;  Laterality: N/A;  . TONSILLECTOMY     Family History:  Family History  Problem Relation Age of Onset  . Hypertension Mother   . Diabetes Father    Family Psychiatric  History: Unknown  Social History:  Social History   Substance and Sexual Activity  Alcohol Use No     Social History   Substance and Sexual Activity  Drug Use No   Comment: Patient denies    Social History   Socioeconomic History  . Marital status: Single    Spouse name: Not on file  . Number of children: Not on file  . Years of education: Not on file  . Highest education level: Not on file  Occupational History  . Not on file  Social Needs  . Financial resource strain: Not on file  . Food insecurity:    Worry: Not on file    Inability: Not on file  . Transportation needs:    Medical: Not on file    Non-medical: Not on file  Tobacco Use  . Smoking status: Current Every Day Smoker    Packs/day: 3.00    Years: 11.00    Pack years: 33.00    Types: Cigarettes  . Smokeless tobacco: Never Used  Substance and Sexual Activity  . Alcohol use: No  . Drug use: No    Comment: Patient denies  . Sexual activity: Yes    Birth control/protection: Implant  Lifestyle  . Physical activity:    Days per week: Not on file    Minutes per session: Not on file  . Stress: Not on file  Relationships  . Social connections:    Talks on phone: Not on file    Gets together: Not on file    Attends religious service: Not on file    Active member of club or organization: Not on file    Attends meetings of clubs or organizations: Not on file    Relationship status: Not on file  Other Topics Concern  . Not on  file  Social History Narrative  . Not on file   Additional Social History: She lives alone.     Allergies:   Allergies  Allergen Reactions  . Wellbutrin [Bupropion] Shortness Of Breath  . Omnipaque [Iohexol] Swelling and Other (See Comments)    Reaction:  Eye swelling  . Penicillins Hives and Other (See Comments)    Has patient had a PCN reaction causing immediate rash, facial/tongue/throat swelling, SOB or lightheadedness with hypotension: No Has patient had a PCN reaction causing severe rash involving mucus membranes or skin necrosis: No Has patient had a PCN reaction that required hospitalization No Has patient had a PCN reaction occurring within the last 10 years: No If all of the above answers are "NO", then may proceed with Cephalosporin use.  . Depakote Er [Divalproex Sodium Er] Nausea And Vomiting    Labs:  Results for orders placed or performed during the hospital encounter of 11/29/17 (from the past 48 hour(s))  Comprehensive metabolic panel     Status: Abnormal  Collection Time: 11/29/17  5:42 PM  Result Value Ref Range   Sodium 140 135 - 145 mmol/L   Potassium 2.6 (LL) 3.5 - 5.1 mmol/L    Comment: CRITICAL RESULT CALLED TO, READ BACK BY AND VERIFIED WITH: DAVEY,A. RN _0  ON 08.05.19 BY COHEN,K    Chloride 99 98 - 111 mmol/L   CO2 28 22 - 32 mmol/L   Glucose, Bld 216 (H) 70 - 99 mg/dL   BUN 10 6 - 20 mg/dL   Creatinine, Ser 0.76 0.44 - 1.00 mg/dL   Calcium 9.7 8.9 - 10.3 mg/dL   Total Protein 7.8 6.5 - 8.1 g/dL   Albumin 4.2 3.5 - 5.0 g/dL   AST 33 15 - 41 U/L   ALT 30 0 - 44 U/L   Alkaline Phosphatase 49 38 - 126 U/L   Total Bilirubin 0.5 0.3 - 1.2 mg/dL   GFR calc non Af Amer >60 >60 mL/min   GFR calc Af Amer >60 >60 mL/min    Comment: (NOTE) The eGFR has been calculated using the CKD EPI equation. This calculation has not been validated in all clinical situations. eGFR's persistently <60 mL/min signify possible Chronic Kidney Disease.    Anion gap  13 5 - 15    Comment: Performed at [facility], [place] [address] [place], [place] [address]  Ethanol     Status: None   Collection Time: 11/29/17  5:42 PM  Result Value Ref Range   Alcohol, Ethyl (B) <10 <10 mg/dL    Comment: (NOTE) Lowest detectable limit for serum alcohol is 10 mg/dL. For medical purposes only. Performed at [facility], [place] [address] [place], [place] [address]   Salicylate level     Status: None   Collection Time: 11/29/17  5:42 PM  Result Value Ref Range   Salicylate Lvl <2.2 2.8 - 30.0 mg/dL    Comment: Performed at [facility], [place] [address] [place], [place] [address]  Acetaminophen level     Status: Abnormal   Collection Time: 11/29/17  5:42 PM  Result Value Ref Range   Acetaminophen (Tylenol), Serum <10 (L) 10 - 30 ug/mL    Comment: (NOTE) Therapeutic concentrations vary significantly. A range of 10-30 ug/mL  may be an effective concentration for many patients. However, some  are best treated at concentrations outside of this range. Acetaminophen concentrations >150 ug/mL at 4 hours after ingestion  and >50 ug/mL at 12 hours after ingestion are often associated with  toxic reactions. Performed at [facility], [place] [address], [place], [place] [address]   cbc     Status: None   Collection Time: 11/29/17  5:42 PM  Result Value Ref Range   WBC 10.3 4.0 - 10.5 K/uL   RBC 5.00 3.87 - 5.11 MIL/uL   Hemoglobin 13.4 12.0 - 15.0 g/dL   HCT 40.3 36.0 - 46.0 %   MCV 80.6 78.0 - 100.0 fL   MCH 26.8 26.0 - 34.0 pg   MCHC 33.3 30.0 - 36.0 g/dL   RDW 13.5 11.5 - 15.5 %   Platelets 265 150 - 400 K/uL    Comment: Performed at [facility], [place] [address] [place], [place] [address]  I-Stat beta hCG blood, ED     Status: None   Collection Time: 11/29/17  5:48 PM  Result Value Ref Range   I-stat hCG, quantitative <5.0 <5 mIU/mL   Comment 3  Comment:    GEST. AGE      CONC.  (mIU/mL)   <=1 WEEK        5 - 50     2 WEEKS       50 - 500     3 WEEKS       100 - 10,000     4 WEEKS     1,000 - 30,000        FEMALE AND NON-PREGNANT FEMALE:     LESS THAN 5 mIU/mL   Rapid urine drug screen (hospital performed)     Status: None   Collection Time: 11/29/17  6:19 PM  Result Value Ref Range   Opiates NONE DETECTED NONE DETECTED   Cocaine NONE DETECTED NONE DETECTED   Benzodiazepines NONE DETECTED NONE DETECTED   Amphetamines NONE DETECTED NONE DETECTED   Tetrahydrocannabinol NONE DETECTED NONE DETECTED   Barbiturates NONE DETECTED NONE DETECTED    Comment: (NOTE) DRUG SCREEN FOR MEDICAL PURPOSES ONLY.  IF CONFIRMATION IS NEEDED FOR ANY PURPOSE, NOTIFY LAB WITHIN 5 DAYS. LOWEST DETECTABLE LIMITS FOR URINE DRUG SCREEN Drug Class                     Cutoff (ng/mL) Amphetamine and metabolites    1000 Barbiturate and metabolites    200 Benzodiazepine                 163 Tricyclics and metabolites     300 Opiates and metabolites        300 Cocaine and metabolites        300 THC                            50 Performed at [facility], [place] [address] [place], [place] [address]   CBG monitoring, ED     Status: Abnormal   Collection Time: 11/29/17  8:22 PM  Result Value Ref Range   Glucose-Capillary 271 (H) 70 - 99 mg/dL    Current Facility-Administered Medications  Medication Dose Route Frequency Provider Last Rate Last Dose  . acetaminophen (TYLENOL) tablet 650 mg  650 mg Oral Q4H PRN Hayden Rasmussen, MD      . alum & mag hydroxide-simeth (MAALOX/MYLANTA) 200-200-20 MG/5ML suspension 30 mL  30 mL Oral Q6H PRN Hayden Rasmussen, MD      . ARIPiprazole (ABILIFY) tablet 10 mg  10 mg Oral Daily Hayden Rasmussen, MD   10 mg at 11/30/17 0841  . busPIRone (BUSPAR) tablet 15 mg  15 mg Oral TID Hayden Rasmussen, MD   15 mg at 11/30/17 [phone]  . fluvoxaMINE (LUVOX) tablet 100 mg  100 mg Oral TID Hayden Rasmussen, MD   100 mg  at 11/30/17 0843  . glipiZIDE (GLUCOTROL XL) 24 hr tablet 10 mg  10 mg Oral BID WC Hayden Rasmussen, MD   10 mg at 11/30/17 0756  . hydrochlorothiazide (HYDRODIURIL) tablet 25 mg  25 mg Oral Daily Hayden Rasmussen, MD   25 mg at 11/30/17 0843  . insulin detemir (LEVEMIR) injection 40 Units  40 Units Subcutaneous BID PC Hayden Rasmussen, MD   40 Units at 11/30/17 0846  . nicotine (NICODERM CQ - dosed in mg/24 hours) patch 21 mg  21 mg Transdermal Daily Hayden Rasmussen, MD   21 mg at 11/30/17 0846  . nicotine polacrilex (NICORETTE) gum 2 mg  2 mg Oral  PRN Hayden Rasmussen, MD      . ondansetron [facility]) tablet 4 mg  4 mg Oral Q8H PRN Hayden Rasmussen, MD   4 mg at 11/30/17 8144   Current Outpatient Medications  Medication Sig Dispense Refill  . ARIPiprazole (ABILIFY) 10 MG tablet Take 10 mg by mouth daily.    . busPIRone (BUSPAR) 15 MG tablet Take 1 tablet (15 mg total) by mouth 2 (two) times daily. For anxiety (Patient taking differently: Take 15 mg by mouth 3 (three) times daily. For anxiety) 60 tablet 0  . etonogestrel (NEXPLANON) 68 MG IMPL implant 1 each by Subdermal route once. Implanted Summer 2018    . fluvoxaMINE (LUVOX) 100 MG tablet Take 100 mg by mouth 3 (three) times daily.  0  . GLIPIZIDE XL 10 MG 24 hr tablet Take 10 mg by mouth 2 (two) times daily.  2  . hydrochlorothiazide (HYDRODIURIL) 25 MG tablet Take 25 mg by mouth daily.    . Insulin Detemir (LEVEMIR FLEXTOUCH) 100 UNIT/ML Pen Inject 40 Units into the skin 2 (two) times daily after a meal.    . INVEGA SUSTENNA 234 MG/1.5ML SUSY injection Inject 234 mg into the muscle every 30 (thirty) days.  7  . nicotine polacrilex (NICORETTE) 2 MG gum Take 2 mg by mouth as needed.  0  . fluvoxaMINE (LUVOX) 50 MG tablet Take 1 tablet (50 mg total) by mouth at bedtime. (Patient not taking: Reported on 11/29/2017) 30 tablet 0  . paliperidone (INVEGA) 3 MG 24 hr tablet Take 1 tablet (3 mg total) by mouth daily. (Patient not taking: Reported  on 10/25/2017) 30 tablet 0    Musculoskeletal: Strength & Muscle Tone: within normal limits Gait & Station: normal Patient leans: N/A  Psychiatric Specialty Exam: Physical Exam  Nursing note and vitals reviewed. Constitutional: She is oriented to person, place, and time. She appears well-developed and well-nourished.  HENT:  Head: Normocephalic and atraumatic.  Neck: Normal range of motion.  Respiratory: Effort normal.  Musculoskeletal: Normal range of motion.  Neurological: She is alert and oriented to person, place, and time.  Skin: No rash noted.  Psychiatric: Her speech is rapid and/or pressured. She is hyperactive. Cognition and memory are impaired. She expresses impulsivity. She exhibits a depressed mood. She expresses suicidal ideation. She expresses suicidal plans.    Review of Systems  Psychiatric/Behavioral: Positive for depression and suicidal ideas. Negative for hallucinations and substance abuse.  All other systems reviewed and are negative.   Blood pressure (!) 109/52, pulse 80, temperature (!) 97.5 F (36.4 C), temperature source Oral, resp. rate 18, height 5' 7" (1.702 m), weight 108.7 kg (239 lb 10.2 oz), SpO2 99 %.Body mass index is 37.53 kg/m.  General Appearance: Fairly Groomed, young, African American female, wearing paper hospital scrubs with a shaved head who is sitting upright in bed. NAD.   Eye Contact:  Good  Speech:  Clear and Coherent although increased rate from baseline.   Volume:  Increased  Mood:  Depressed  Affect:  Congruent  Thought Process:  Goal Directed, Linear and Descriptions of Associations: Intact  Orientation:  Full (Time, Place, and Person)  Thought Content:  Rumination  Suicidal Thoughts:  Yes.  with intent/plan  Homicidal Thoughts:  No  Memory:  Immediate;   Good Recent;   Good Remote;   Good  Judgement:  Fair  Insight:  Fair  Psychomotor Activity:  Increased  Concentration:  Concentration: Good and Attention Span: Good   Recall:  Good  Fund of Knowledge:  Good  Language:  Good  Akathisia:  No  Handed:  Right  AIMS (if indicated):   N/A  Assets:  Communication Skills Desire for Improvement Housing Social Support  ADL's:  Intact  Cognition:  Impaired due to psychiatric condition.   Sleep:   N/A   Assessment:  [name] is 27 y.o. female who was admitted with SI. She endorses SI with a plan to shoot self. She ruminates about her plans to harm self. She reports depression and medication ineffectiveness. She warrants inpatient psychiatric hospitalization for stabilization and treatment.   Treatment Plan Summary: Daily contact with patient to assess and evaluate symptoms and progress in treatment and Medication management  -Continue Abilify 10 mg daily for mood stabilization, Buspar 15 mg TID for anxiety and Luvox 100 mg TID for depression and anxiety.   Disposition: Recommend psychiatric Inpatient admission when medically cleared.  Stacy Dingwall, DO 11/30/2017 9:09 AM

## 2017-11-30 NOTE — ED Notes (Signed)
TTS at beside.

## 2017-11-30 NOTE — Tx Team (Signed)
Initial Treatment Plan 11/30/2017 12:43 PM [name] [place] ZPH:[id number]    PATIENT STRESSORS: Financial difficulties Health problems Medication change or noncompliance   PATIENT STRENGTHS: Capable of independent living Motivation for treatment/growth   PATIENT IDENTIFIED PROBLEMS: "work on my OCD"  "keep my mind focus"  Suicidal Ideation  Depression  Anxiety             DISCHARGE CRITERIA:  Adequate post-discharge living arrangements Verbal commitment to aftercare and medication compliance  PRELIMINARY DISCHARGE PLAN: Outpatient therapy Return to previous living arrangement  PATIENT/FAMILY INVOLVEMENT: This treatment plan has been presented to and reviewed with the patient, [name], and/or family member.  The patient and family have been given the opportunity to ask questions and make suggestions.  Vela Prose, RN 11/30/2017, 12:43 PM

## 2017-11-30 NOTE — Progress Notes (Signed)
Recreation Therapy Notes  INPATIENT RECREATION THERAPY ASSESSMENT  Patient Details Name: [name] MRN: [id number] DOB: [birth year] Today's Date: 11/30/2017       Information Obtained From: Patient  Able to Participate in Assessment/Interview: Yes  Patient Presentation: (Drowsy)  Reason for Admission (Per Patient): Suicidal Ideation, Other (Comments)(OCD; Negative thoughts)  Patient Stressors: Relationship, Other (Comment)(Keep shaving head)  Coping Skills:   Isolation, Self-Injury, Arguments, Music, Exercise, Impulsivity, Art, Prayer, Avoidance, Hot Bath/Shower  Leisure Interests (2+):  Exercise - Walking, Art - Coloring  Frequency of Recreation/Participation: Other (Comment)(Daily)  Awareness of Community Resources:  No  Expressed Interest in [employer] Information: Yes  County of Residence:  [place]  Patient Main Form of Transportation: Diplomatic Services operational officer  Patient Strengths:  Cooking; Taking medications  Patient Identified Areas of Improvement:  Depression; OCD  Patient Goal for Hospitalization:  "Stop shaving my head"  Current SI (including self-harm):  Yes(Pt rated it an 8 out of 10; contracts for safety)  Current HI:  No  Current AVH: No  Staff Intervention Plan: Group Attendance, Collaborate with Interdisciplinary Treatment Team  Consent to Intern Participation: N/A   [name], LRT/CTRS  Ria Comment, [place] 11/30/2017, 1:39 PM

## 2017-12-01 DIAGNOSIS — F429 Obsessive-compulsive disorder, unspecified: Secondary | ICD-10-CM

## 2017-12-01 DIAGNOSIS — F419 Anxiety disorder, unspecified: Secondary | ICD-10-CM

## 2017-12-01 DIAGNOSIS — F201 Disorganized schizophrenia: Principal | ICD-10-CM

## 2017-12-01 LAB — TSH: TSH: 0.483 u[IU]/mL (ref 0.350–4.500)

## 2017-12-01 LAB — LIPID PANEL
Cholesterol: 253 mg/dL — ABNORMAL HIGH (ref 0–200)
HDL: 42 mg/dL (ref >40)
LDL CALC: 147 mg/dL — AB (ref 0–99)
TRIGLYCERIDES: 319 mg/dL — AB (ref <150)
Total CHOL/HDL Ratio: 6 RATIO
VLDL: 64 mg/dL — ABNORMAL HIGH (ref 0–40)

## 2017-12-01 LAB — GLUCOSE, CAPILLARY
GLUCOSE-CAPILLARY: 283 mg/dL — AB (ref 70–99)
Glucose-Capillary: 296 mg/dL — ABNORMAL HIGH (ref 70–99)
Glucose-Capillary: 377 mg/dL — ABNORMAL HIGH (ref 70–99)

## 2017-12-01 LAB — HEMOGLOBIN A1C
HEMOGLOBIN A1C: 9.1 % — AB (ref 4.8–5.6)
Mean Plasma Glucose: 214.47 mg/dL

## 2017-12-01 MED ORDER — NICOTINE POLACRILEX 2 MG MT GUM
2.0000 mg | CHEWING_GUM | OROMUCOSAL | Status: DC | PRN
  Administered 2017-12-01 – 2017-12-06 (×17): 2 mg via ORAL
  Filled 2017-12-01 (×5): qty 1

## 2017-12-01 MED ORDER — OLANZAPINE 5 MG PO TBDP
5.0000 mg | ORAL_TABLET | Freq: Three times a day (TID) | ORAL | Status: DC | PRN
Start: 2017-12-01 — End: 2017-12-06
  Administered 2017-12-02 – 2017-12-06 (×8): 5 mg via ORAL
  Filled 2017-12-01 (×8): qty 1

## 2017-12-01 MED ORDER — ARIPIPRAZOLE 15 MG PO TABS
15.0000 mg | ORAL_TABLET | Freq: Every day | ORAL | Status: DC
  Administered 2017-12-02: 15 mg via ORAL
  Filled 2017-12-01 (×3): qty 1

## 2017-12-01 MED ORDER — ARIPIPRAZOLE 5 MG PO TABS
5.0000 mg | ORAL_TABLET | Freq: Once | ORAL | Status: AC
Start: 2017-12-01 — End: 2017-12-01
  Administered 2017-12-01: 5 mg via ORAL
  Filled 2017-12-01: qty 1

## 2017-12-01 MED ORDER — ZIPRASIDONE MESYLATE 20 MG IM SOLR: 20.0000 mg | Freq: Two times a day (BID) | INTRAMUSCULAR | Status: DC | PRN

## 2017-12-01 NOTE — Progress Notes (Signed)
Recreation Therapy Notes  Date: 8.7.19 Time: 1000 Location: 500 Hall Dayroom  Group Topic: Coping Skills  Goal Area(s) Addresses:  Patient will be able to identify positive coping skills. Patient will be able to identify benefits of coping skills. Patient will identify benefits of using coping skills post d/c.  Intervention: Worksheet, magazines, scissors, glue sticks, Architect paper  Activity: Radiographer, therapeutic.  Patients were to use the magazines to find pictures that represent coping skills for diversions, social, cognitive, tension releasers and physical.   Education: Coping Skills, Discharge Planning.   Education Outcome: Acknowledges understanding/In group clarification offered/Needs additional education.   Clinical Observations/Feedback:  Pt did not attend group.    [name], LRT/CTRS         [name] A 12/01/2017 11:37 AM

## 2017-12-01 NOTE — BHH Suicide Risk Assessment (Signed)
[facility] Admission Suicide Risk Assessment   Nursing information obtained from:  Patient Demographic factors:  Adolescent or young adult Current Mental Status:  Suicidal ideation indicated by patient Loss Factors:  Loss of significant relationship Historical Factors:  Prior suicide attempts Risk Reduction Factors:  NA  Total Time spent with patient: 1 hour Principal Problem: OCD (obsessive compulsive disorder) Diagnosis:   Patient Active Problem List   Diagnosis Date Noted  . 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 Moderate bipolar I disorder, most recent episode depressed ([place]) [F31.32]   . Psychosis ([place]) [F29]   . Adjustment disorder with mixed disturbance of emotions and conduct [F43.25] 08/03/2017  . LGSIL on Pap smear of cervix [R87.612] 02/01/2017  . OCD (obsessive compulsive disorder) [F42.9] 10/05/2016  . Major depressive disorder, recurrent episode, mild ([place]) [F33.0] 05/04/2016  . 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 Learning disability [F81.9] 07/18/2015  . 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 Diabetes mellitus ([place]) [E11.9] 07/18/2015  . MDD (major depressive disorder), recurrent, severe, with psychosis ([place]) [F33.3] 07/18/2015  . Hyperlipidemia [E78.5] 07/18/2015  . Severe episode of recurrent major depressive disorder, without psychotic features ([place]) [F33.2]   . Drug overdose [T50.901A]   . Cognitive deficits [R41.89] 10/12/2012  . Generalized anxiety disorder [F41.1] 06/28/2012   Subjective Data:  [name] is a 27 y/o F with history of schizophrenia, bipolar disorder, OCD, GAD, MDD, and borderline intellectual functioning who was admitted voluntarily from [place] where she presented with 2-day history of worsening intrusive thoughts and compulsion to cut her hair. Pt had called the police and reported SI associated with stressor of finding out her boyfriend has been in contact with his ex-wife. Pt was medically cleared  and then transferred to [facility] for additional treatment and stabilization.  Upon initial interview, pt shares, "I'm diagnosed with OCD and I want to stop shaving my head. I've been doing it for four years now." Pt goes on to share that she has been having SI with plan to overdose on her medications. He denies HI/AH/VH. She cites stressor of strained relationship with her boyfriend of 3 months, and she does not identify other stressors. She is sleeping well. She endorses depressive symptoms of anhedonia, guilty feelings, and low energy. She denies symptoms of mania and PTSD. She reports compulsion to shave her head and eyebrows but she is unable to identify any obsessive thoughts aside from the urge to shave her head; however, from the interview, pt appears to have this compulsive behavior when faced with social stressors such as her strained relationship with her boyfriend. She denies illicit substance use aside from 2-3 ppd of tobacco.  Discussed with patient about treatment options. She reports good follow up with her [facility] team, and good adherence to her outpatient medication regimen.  She reports taking buspar 15mg  TID, luvox 100mg  TID, Lorayne Bender Sustenna 234mg  q28 days (last given about 1 week ago), and abilify 10mg  po qDay. Pt shares that she has only been on abilify for about 1-2 months, and it was initially helpful for her compulsive behaviors. She has been on her other medications for about 1-2 years and she feels that they have been helpful overall. She is in agreement to increase dose of abilify oral form today with potential of changing to long-acting injectable form if there is good tolerability/effiacy of abilify at larger dose. We will also reach out to pt's ACT team for collateral and to coordinate care. Pt was in agreement with the above plan,  and she had no further questions, comments, or concerns.   Continued Clinical Symptoms:  Alcohol Use Disorder Identification Test Final Score (AUDIT):  0 The "Alcohol Use Disorders Identification Test", Guidelines for Use in Primary Care, Second Edition.  World Pharmacologist [facility]). Score between 0-7:  no or low risk or alcohol related problems. Score between 8-15:  moderate risk of alcohol related problems. Score between 16-19:  high risk of alcohol related problems. Score 20 or above:  warrants further diagnostic evaluation for alcohol dependence and treatment.   CLINICAL FACTORS:   Severe Anxiety and/or Agitation Depression:   Severe Obsessive-Compulsive Disorder Schizophrenia:   Paranoid or undifferentiated type More than one psychiatric diagnosis Currently Psychotic Previous Psychiatric Diagnoses and Treatments Medical Diagnoses and Treatments/Surgeries   Musculoskeletal: Strength & Muscle Tone: within normal limits Gait & Station: normal Patient leans: N/A  Psychiatric Specialty Exam: Physical Exam  Nursing note and vitals reviewed.   Review of Systems  Constitutional: Negative for chills and fever.  Respiratory: Negative for cough and shortness of breath.   Cardiovascular: Negative for chest pain.  Gastrointestinal: Negative for abdominal pain, heartburn, nausea and vomiting.  Psychiatric/Behavioral: Positive for depression and suicidal ideas. Negative for hallucinations. The patient is nervous/anxious. The patient does not have insomnia.     Blood pressure (!) 101/54, pulse 66, temperature 98.7 F (37.1 C), temperature source Oral, resp. rate 16, height 5\' 7"  (1.702 m), weight 108 kg (238 lb 1.6 oz), SpO2 100 %.Body mass index is 37.29 kg/m.  General Appearance: Casual and Fairly Groomed  Eye Contact:  Good  Speech:  Clear and Coherent and Normal Rate  Volume:  Normal  Mood:  Anxious and Depressed  Affect:  Congruent, Depressed and Flat  Thought Process:  Coherent and Goal Directed  Orientation:  Full (Time, Place, and Person)  Thought Content:  Logical  Suicidal Thoughts:  No  Homicidal Thoughts:  No   Memory:  Immediate;   Fair Recent;   Fair Remote;   Fair  Judgement:  Poor  Insight:  Lacking  Psychomotor Activity:  Normal  Concentration:  Concentration: Fair  Recall:  [employer] of Knowledge:  Fair  Language:  Fair  Akathisia:  No  Handed:    AIMS (if indicated):     Assets:  Resilience Social Support  ADL's:  Intact  Cognition:  WNL  Sleep:  Number of Hours: 6.25      COGNITIVE FEATURES THAT CONTRIBUTE TO RISK:  None    SUICIDE RISK:   Minimal: No identifiable suicidal ideation.  Patients presenting with no risk factors but with morbid ruminations; may be classified as minimal risk based on the severity of the depressive symptoms  PLAN OF CARE:   -Admit to inpatient level of care  -Schizophrenia   -Continue Lorayne Bender Sustenna 234mg  IM q28 days (last given about 1 wk ago)  -OCD   -Change abilify 10mg  po qday to abilify 15mg  po qday    -Continue luvox 100mg  po TID  -Anxiety   -Continue buspar 15mg  po TID  -HTN   -Continue HCTZ 25mg  po qDay  -DMII   -Continue glipizide 10mg  po BID with meals   -Continue levemir 40 Units [place] BID after meals  -Agitation    -Continue zydis 5mg  po q8h prn agitation OR geodon 20mg  IM q12h prn severe agitation  -Insomnia   -Continue trazodone 100mg  po qhs prn insomnia  -Encourage participation in groups and therapeutic milieu  -Disposition planning will be ongoing   I certify  that inpatient services furnished can reasonably be expected to improve the patient's condition.   Pennelope Bracken, MD 12/01/2017, 1:56 PM

## 2017-12-01 NOTE — Tx Team (Signed)
Interdisciplinary Treatment and Diagnostic Plan Update  12/01/2017 Time of Session: 9:28 AM  [name] MRN: [id number]  Principal Diagnosis: <principal problem not specified>  Secondary Diagnoses: Active Problems:   Schizophrenia, disorganized ([place])   Current Medications:  Current Facility-Administered Medications  Medication Dose Route Frequency Provider Last Rate Last Dose  . acetaminophen (TYLENOL) tablet 650 mg  650 mg Oral Q6H PRN Nanci Pina, FNP      . alum & mag hydroxide-simeth (MAALOX/MYLANTA) 200-200-20 MG/5ML suspension 30 mL  30 mL Oral Q4H PRN Nanci Pina, FNP      . ARIPiprazole (ABILIFY) tablet 10 mg  10 mg Oral Daily Nanci Pina, FNP   10 mg at 12/01/17 0809  . busPIRone (BUSPAR) tablet 15 mg  15 mg Oral TID Nanci Pina, FNP   15 mg at 12/01/17 0809  . fluvoxaMINE (LUVOX) tablet 100 mg  100 mg Oral TID Nanci Pina, FNP   100 mg at 12/01/17 6160  . glipiZIDE (GLUCOTROL XL) 24 hr tablet 10 mg  10 mg Oral BID WC Nanci Pina, FNP   10 mg at 12/01/17 7371  . hydrochlorothiazide (HYDRODIURIL) tablet 25 mg  25 mg Oral Daily Nanci Pina, FNP   25 mg at 12/01/17 0809  . hydrOXYzine (ATARAX/VISTARIL) tablet 25 mg  25 mg Oral TID PRN Nanci Pina, FNP   25 mg at 11/30/17 2039  . insulin detemir (LEVEMIR) injection 40 Units  40 Units Subcutaneous BID PC Nanci Pina, FNP   40 Units at 12/01/17 0810  . magnesium hydroxide (MILK OF MAGNESIA) suspension 30 mL  30 mL Oral Daily PRN Starkes, Takia S, FNP      . nicotine (NICODERM CQ - dosed in mg/24 hours) patch 21 mg  21 mg Transdermal Daily Starkes, Takia S, FNP      . ondansetron (ZOFRAN) tablet 4 mg  4 mg Oral Q8H PRN Starkes, Takia S, FNP      . pneumococcal 23 valent vaccine (PNU-IMMUNE) injection 0.5 mL  0.5 mL Intramuscular Tomorrow-1000 [name] T, MD      . traZODone (DESYREL) tablet 100 mg  100 mg Oral QHS PRN Nanci Pina, FNP   100 mg at 11/30/17 2039    PTA  Medications: Medications Prior to Admission  Medication Sig Dispense Refill Last Dose  . ARIPiprazole (ABILIFY) 10 MG tablet Take 10 mg by mouth daily.   11/28/2017 at Unknown time  . busPIRone (BUSPAR) 15 MG tablet Take 1 tablet (15 mg total) by mouth 2 (two) times daily. For anxiety (Patient taking differently: Take 15 mg by mouth 3 (three) times daily. For anxiety) 60 tablet 0 11/29/2017 at Unknown time  . etonogestrel (NEXPLANON) 68 MG IMPL implant 1 each by Subdermal route once. Implanted Summer 2018   10/30/2017 at Unknown time  . fluvoxaMINE (LUVOX) 100 MG tablet Take 100 mg by mouth 3 (three) times daily.  0 11/29/2017 at Unknown time  . fluvoxaMINE (LUVOX) 50 MG tablet Take 1 tablet (50 mg total) by mouth at bedtime. (Patient not taking: Reported on 11/29/2017) 30 tablet 0 Not Taking at DOSE  . GLIPIZIDE XL 10 MG 24 hr tablet Take 10 mg by mouth 2 (two) times daily.  2 11/29/2017 at Unknown time  . hydrochlorothiazide (HYDRODIURIL) 25 MG tablet Take 25 mg by mouth daily.   11/28/2017 at Unknown time  . Insulin Detemir (LEVEMIR FLEXTOUCH) 100 UNIT/ML Pen Inject 40 Units into the skin 2 (two)  times daily after a meal.   11/29/2017 at Unknown time  . INVEGA SUSTENNA 234 MG/1.5ML SUSY injection Inject 234 mg into the muscle every 30 (thirty) days.  7 Past Week at Unknown time  . nicotine polacrilex (NICORETTE) 2 MG gum Take 2 mg by mouth as needed.  0 Past Month at Unknown time  . paliperidone (INVEGA) 3 MG 24 hr tablet Take 1 tablet (3 mg total) by mouth daily. (Patient not taking: Reported on 10/25/2017) 30 tablet 0 Not Taking at Unknown time    Patient Stressors: Financial difficulties Health problems Medication change or noncompliance  Patient Strengths: Capable of independent living Motivation for treatment/growth  Treatment Modalities: Medication Management, Group therapy, Case management,  1 to 1 session with clinician, Psychoeducation, Recreational therapy.   Physician Treatment Plan for  Primary Diagnosis: <principal problem not specified> Long Term Goal(s): Improvement in symptoms so as ready for discharge  Short Term Goals: Ability to verbalize feelings will improve Ability to disclose and discuss suicidal ideas Ability to demonstrate self-control will improve Ability to identify and develop effective coping behaviors will improve Compliance with prescribed medications will improve Ability to identify triggers associated with substance abuse/mental health issues will improve  Medication Management: Evaluate patient's response, side effects, and tolerance of medication regimen.  Therapeutic Interventions: 1 to 1 sessions, Unit Group sessions and Medication administration.  Evaluation of Outcomes: Progressing  Physician Treatment Plan for Secondary Diagnosis: Active Problems:   Schizophrenia, disorganized ([place])   Long Term Goal(s): Improvement in symptoms so as ready for discharge  Short Term Goals: Ability to verbalize feelings will improve Ability to disclose and discuss suicidal ideas Ability to demonstrate self-control will improve Ability to identify and develop effective coping behaviors will improve Compliance with prescribed medications will improve Ability to identify triggers associated with substance abuse/mental health issues will improve  Medication Management: Evaluate patient's response, side effects, and tolerance of medication regimen.  Therapeutic Interventions: 1 to 1 sessions, Unit Group sessions and Medication administration.  Evaluation of Outcomes: Progressing   RN Treatment Plan for Primary Diagnosis: <principal problem not specified> Long Term Goal(s): Knowledge of disease and therapeutic regimen to maintain health will improve  Short Term Goals: Ability to identify and develop effective coping behaviors will improve and Compliance with prescribed medications will improve  Medication Management: RN will administer medications as  ordered by provider, will assess and evaluate patient's response and provide education to patient for prescribed medication. RN will report any adverse and/or side effects to prescribing provider.  Therapeutic Interventions: 1 on 1 counseling sessions, Psychoeducation, Medication administration, Evaluate responses to treatment, Monitor vital signs and CBGs as ordered, Perform/monitor CIWA, COWS, AIMS and Fall Risk screenings as ordered, Perform wound care treatments as ordered.  Evaluation of Outcomes: Progressing   LCSW Treatment Plan for Primary Diagnosis: <principal problem not specified> Long Term Goal(s): Safe transition to appropriate next level of care at discharge, Engage patient in therapeutic group addressing interpersonal concerns.  Short Term Goals: Engage patient in aftercare planning with referrals and resources  Therapeutic Interventions: Assess for all discharge needs, 1 to 1 time with Social worker, Explore available resources and support systems, Assess for adequacy in community support network, Educate family and significant other(s) on suicide prevention, Complete Psychosocial Assessment, Interpersonal group therapy.  Evaluation of Outcomes: Met  Return home, follow up [facility]   Progress in Treatment: Attending groups: Yes Participating in groups: Yes Taking medication as prescribed: Yes Toleration medication: Yes, no side effects reported at this  time Family/Significant other contact made: Yes ACT team Patient understands diagnosis: No Limited insight Discussing patient identified problems/goals with staff: Yes Medical problems stabilized or resolved: Yes Denies suicidal/homicidal ideation: Yes Issues/concerns per patient self-inventory: None Other: N/A  New problem(s) identified: None identified at this time.   New Short Term/Long Term Goal(s): "I want to stop shaving my head. I need help with my OCD.  And I don't want to move to a group home.".   Discharge  Plan or Barriers:   Reason for Continuation of Hospitalization: Intrusive thoughts Depression Medication stabilization  Estimated Length of Stay: 8/12  Attendees: Patient: [name] 12/01/2017  9:28 AM  Physician: [name], MD 12/01/2017  9:28 AM  Nursing: Sena Hitch, RN 12/01/2017  9:28 AM  RN Care Manager: Lars Pinks, RN 12/01/2017  9:28 AM  Social Worker: Ripley Fraise 12/01/2017  9:28 AM  Recreational Therapist: Winfield Cunas 12/01/2017  9:28 AM  Other: Norberto Sorenson 12/01/2017  9:28 AM  Other:  12/01/2017  9:28 AM    Scribe for Treatment Team:  Roque Lias LCSW 12/01/2017 9:28 AM

## 2017-12-01 NOTE — [facility] Group Notes (Signed)
[place] Group Notes:  (Nursing/MHT/Case Management/Adjunct)  Date:  12/01/2017  Time:  5:32 PM  Type of Therapy:  Psychoeducational Skills  Participation Level:  Active  Participation Quality:  Appropriate  Affect:  Appropriate  Cognitive:  Appropriate  Insight:  Appropriate  Engagement in Group:  Engaged  Modes of Intervention:  Discussion  Summary of Progress/Problems:  Patient attended group and was appropriate and alert.    Cammy Copa 12/01/2017, 5:32 PM

## 2017-12-01 NOTE — Plan of Care (Signed)
Nurse discussed anxiety, depression, coping skills with patient. 
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
## 2017-12-01 NOTE — H&P (Addendum)
Psychiatric Admission Assessment Adult  Patient Identification: [name]  MRN:  [id number]  Date of Evaluation:  12/01/2017  Chief Complaint: "I keep thinking about shaving my head, then became suicidal".  Principal Diagnosis: Bipolar 1 disorder disorder, severe, Obsessive compulsive disorder.  Diagnosis:   Patient Active Problem List   Diagnosis Date Noted  . 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 Moderate bipolar I disorder, most recent episode depressed ([place]) [F31.32]   . Psychosis ([place]) [F29]   . Adjustment disorder with mixed disturbance of emotions and conduct [F43.25] 08/03/2017  . LGSIL on Pap smear of cervix [R87.612] 02/01/2017  . OCD (obsessive compulsive disorder) [F42.9] 10/05/2016  . Major depressive disorder, recurrent episode, mild ([place]) [F33.0] 05/04/2016  . 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 Learning disability [F81.9] 07/18/2015  . 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 Diabetes mellitus ([place]) [E11.9] 07/18/2015  . MDD (major depressive disorder), recurrent, severe, with psychosis ([place]) [F33.3] 07/18/2015  . Hyperlipidemia [E78.5] 07/18/2015  . Severe episode of recurrent major depressive disorder, without psychotic features ([place]) [F33.2]   . Drug overdose [T50.901A]   . Cognitive deficits [R41.89] 10/12/2012  . Generalized anxiety disorder [F41.1] 06/28/2012   History of Present Illness: This is one of several admission assessments for this 27 year old [name] with hx of mental illness & obsessive compulsive disorder. She is known on this [facility] adult unit from previous hospitalizations all related to her worsening symptoms of obsessive thoughts of shaving her hair. The last time she was in this hospital, part of her complaints were impulsive nature of her hair shaving & bleaching her scalp as well. She was on both oral/injectable antipsychotic therapies, including Luvox which she receives from her [facility].  She is currently being admitted with the complaints of obsessive thoughts of shaving her hair that triggered suicidal thoughts. She called the cops threatening to blow her brains out. The cops brought her to the hospital for evaluation & treatment.  During this assessment, Maggi reports, "I was here last year for treatment for my OCD. I keep thinking about shaving my hair x 2 days. So, 2 days ago, I did shave my hair. Then, I called the cops because I was having suicidal thoughts as result. It is the OCD thoughts going on with me. I keep wanting to shave my hair & my eye brows. But, there were no hair to shave because I done shave them all off. These thoughts are driving me crazy. I want treatment for it. This obsessive thinking always lead me to have to suicidal thoughts. I had overdosed on Buspar pills 2 months ago because of the thoughts. My current medicines are not working. I am on Luvox, Abilify & Invega shots. I get all my medicines through my Act Team with [facility]. I sleep well, but, I'm always afraid that someone will break into my apartment while I was asleep. Please, I do not want to go to a group home after discharge. Don't send me to a group home. I would like to go home to my apartment. I want to keep my apartment please, I'm not feeling suicidal at this point. I don't hear voices or see things. I just got this OCD going on".  Associated Signs/Symptoms:  Depression Symptoms:  depressed mood, anxiety,  (Hypo) Manic Symptoms:  No.   Anxiety Symptoms: Reports increased anxiousness & ruminations about shaving her hair.  Psychotic Symptoms: Denies any hallucinations, no delusions expressed, but states she avoids people  if possible, because " I think they are going to pick at me because I don't have hair".  PTSD Symptoms: Denies   Total Time spent with patient: 1 hour.  Past Psychiatric History: Patient have had several prior inpatient psychiatric admissions, most recently.  In the past,  has been diagnosed with MDD, GAD, Borderline Intellectual Functioning, Bipolar & OCD. History of suicide attempt by overdosing. Denies history of psychosis  Is the patient at risk to self? Yes.    Has the patient been a risk to self in the past 6 months? Yes.    Has the patient been a risk to self within the distant past? Yes.    Is the patient a risk to others? No.  Has the patient been a risk to others in the past 6 months? No.  Has the patient been a risk to others within the distant past? No.   Prior Inpatient Therapy: Yes.  Prior Outpatient Therapy: Had been following at [facility] , as per her report  Alcohol Screening: Patient refused Alcohol Screening Tool: Yes 1. How often do you have a drink containing alcohol?: Never 2. How many drinks containing alcohol do you have on a typical day when you are drinking?: 1 or 2 3. How often do you have six or more drinks on one occasion?: Never AUDIT-C Score: 0 4. How often during the last year have you found that you were not able to stop drinking once you had started?: Never 5. How often during the last year have you failed to do what was normally expected from you becasue of drinking?: Never 6. How often during the last year have you needed a first drink in the morning to get yourself going after a heavy drinking session?: Never 7. How often during the last year have you had a feeling of guilt of remorse after drinking?: Never 8. How often during the last year have you been unable to remember what happened the night before because you had been drinking?: Never 9. Have you or someone else been injured as a result of your drinking?: No 10. Has a relative or friend or a doctor or another health worker been concerned about your drinking or suggested you cut down?: No Alcohol Use Disorder Identification Test Final Score (AUDIT): 0 Intervention/Follow-up: Patient Refused  Substance Abuse History in the last 12 months:   Denies alcohol abuse, denies any drug abuse. UDS report, clear.   Consequences of Substance Abuse: Denies   Previous Psychotropic Medications: Currently on Luvox, Buspar,  [place] every month- states last received Invega dose yesterday 6/6.   Psychological Evaluations: No  Past Medical History:  Past Medical History:  Diagnosis Date  . Anxiety   . Bipolar 1 disorder ([place])   . Cognitive deficits   . Depression   . Diabetes mellitus without complication ([place])   . Hypertension   . Mental disorder   . Obesity     Past Surgical History:  Procedure Laterality Date  . CESAREAN SECTION    . CESAREAN SECTION N/A 04/25/2013   Procedure: REPEAT CESAREAN SECTION;  Surgeon: [name], MD;  Location: [facility];  Service: Obstetrics;  Laterality: N/A;  . MASS EXCISION N/A 06/03/2012   Procedure: EXCISION MASS;  Surgeon: [name], MD;  Location: [place];  Service: ENT;  Laterality: N/A;  Excision uvula mass  . TONSILLECTOMY N/A 06/03/2012   Procedure: TONSILLECTOMY;  Surgeon: [name], MD;  Location: [place];  Service: ENT;  Laterality: N/A;  . TONSILLECTOMY     Family History: occasional , distant relationship with father, lives with mother, has two sisters  Family History  Problem Relation Age of Onset  . Hypertension Mother   . Diabetes Father    Family Psychiatric  History: Patient denies psychiatric illnesses in her family. Denies history of suicides, denies history of drug or alcohol abuse in family   Tobacco Screening: Have you used any form of tobacco in the last 30 days? (Cigarettes, Smokeless Tobacco, Cigars, and/or Pipes): Yes Tobacco use, Select all that apply: 5 or more cigarettes per day Are you interested in Tobacco Cessation Medications?: Yes, will notify MD for an order  Social History: Single, lives an apartment, has three children - states they were all adopted out. Denies any legal issues, she is on disability,  manages her own money.  Social History   Substance and Sexual Activity  Alcohol Use No     Social History   Substance and Sexual Activity  Drug Use No   Comment: Patient denies    Additional Social History:  Allergies:   Allergies  Allergen Reactions  . Wellbutrin [Bupropion] Shortness Of Breath  . Omnipaque [Iohexol] Swelling and Other (See Comments)    Reaction:  Eye swelling  . Penicillins Hives and Other (See Comments)    Has patient had a PCN reaction causing immediate rash, facial/tongue/throat swelling, SOB or lightheadedness with hypotension: No Has patient had a PCN reaction causing severe rash involving mucus membranes or skin necrosis: No Has patient had a PCN reaction that required hospitalization No Has patient had a PCN reaction occurring within the last 10 years: No If all of the above answers are "NO", then may proceed with Cephalosporin use.  . Depakote Er [Divalproex Sodium Er] Nausea And Vomiting   Lab Results:  Results for orders placed or performed during the hospital encounter of 11/30/17 (from the past 48 hour(s))  Glucose, capillary     Status: Abnormal   Collection Time: 11/30/17 11:48 AM  Result Value Ref Range   Glucose-Capillary 228 (H) 70 - 99 mg/dL  Glucose, capillary     Status: Abnormal   Collection Time: 12/01/17  6:27 AM  Result Value Ref Range   Glucose-Capillary 296 (H) 70 - 99 mg/dL   Blood Alcohol level:  Lab Results  Component Value Date   ETH <10 11/29/2017   ETH <10 14/48/1856   Metabolic Disorder Labs:  Lab Results  Component Value Date   HGBA1C 7.8 (H) 07/18/2015   MPG 177 07/18/2015   MPG 166 05/11/2015   Lab Results  Component Value Date   PROLACTIN 37.3 (H) 07/18/2015   PROLACTIN 42.0 (H) 05/11/2015   Lab Results  Component Value Date   CHOL 212 (H) 07/18/2015   TRIG 496 (H) 07/18/2015   HDL 40 (L) 07/18/2015   CHOLHDL 5.3 07/18/2015   VLDL UNABLE TO CALCULATE IF TRIGLYCERIDE OVER 400 mg/dL 07/18/2015    LDLCALC UNABLE TO CALCULATE IF TRIGLYCERIDE OVER 400 mg/dL 07/18/2015   LDLCALC 116 (H) 05/11/2015    Current Medications: Current Facility-Administered Medications  Medication Dose Route Frequency Provider Last Rate Last Dose  . acetaminophen (TYLENOL) tablet 650 mg  650 mg Oral Q6H PRN Nanci Pina, FNP      . alum & mag hydroxide-simeth (MAALOX/MYLANTA) 200-200-20 MG/5ML suspension 30 mL  30 mL Oral Q4H PRN Nanci Pina, FNP      . ARIPiprazole (ABILIFY) tablet 10 mg  10 mg Oral Daily Nanci Pina, FNP   10 mg at 12/01/17 0809  . busPIRone (BUSPAR) tablet 15 mg  15 mg Oral TID Nanci Pina, FNP   15 mg at 12/01/17 0809  . fluvoxaMINE (LUVOX) tablet 100 mg  100 mg Oral TID Nanci Pina, FNP   100 mg at 12/01/17 4481  . glipiZIDE (GLUCOTROL XL) 24 hr tablet 10 mg  10 mg Oral BID WC Nanci Pina, FNP   10 mg at 12/01/17 8563  . hydrochlorothiazide (HYDRODIURIL) tablet 25 mg  25 mg Oral Daily Nanci Pina, FNP   25 mg at 12/01/17 0809  . hydrOXYzine (ATARAX/VISTARIL) tablet 25 mg  25 mg Oral TID PRN Nanci Pina, FNP   25 mg at 11/30/17 2039  . insulin detemir (LEVEMIR) injection 40 Units  40 Units Subcutaneous BID PC Nanci Pina, FNP   40 Units at 11/30/17 1814  . magnesium hydroxide (MILK OF MAGNESIA) suspension 30 mL  30 mL Oral Daily PRN Starkes, Takia S, FNP      . nicotine (NICODERM CQ - dosed in mg/24 hours) patch 21 mg  21 mg Transdermal Daily Starkes, Takia S, FNP      . ondansetron (ZOFRAN) tablet 4 mg  4 mg Oral Q8H PRN Starkes, Takia S, FNP      . pneumococcal 23 valent vaccine (PNU-IMMUNE) injection 0.5 mL  0.5 mL Intramuscular Tomorrow-1000 [name] T, MD      . traZODone (DESYREL) tablet 100 mg  100 mg Oral QHS PRN Nanci Pina, FNP   100 mg at 11/30/17 2039   PTA Medications: Medications Prior to Admission  Medication Sig Dispense Refill Last Dose  . ARIPiprazole (ABILIFY) 10 MG tablet Take 10 mg by mouth daily.   11/28/2017  at Unknown time  . busPIRone (BUSPAR) 15 MG tablet Take 1 tablet (15 mg total) by mouth 2 (two) times daily. For anxiety (Patient taking differently: Take 15 mg by mouth 3 (three) times daily. For anxiety) 60 tablet 0 11/29/2017 at Unknown time  . etonogestrel (NEXPLANON) 68 MG IMPL implant 1 each by Subdermal route once. Implanted Summer 2018   10/30/2017 at Unknown time  . fluvoxaMINE (LUVOX) 100 MG tablet Take 100 mg by mouth 3 (three) times daily.  0 11/29/2017 at Unknown time  . fluvoxaMINE (LUVOX) 50 MG tablet Take 1 tablet (50 mg total) by mouth at bedtime. (Patient not taking: Reported on 11/29/2017) 30 tablet 0 Not Taking at DOSE  . GLIPIZIDE XL 10 MG 24 hr tablet Take 10 mg by mouth 2 (two) times daily.  2 11/29/2017 at Unknown time  . hydrochlorothiazide (HYDRODIURIL) 25 MG tablet Take 25 mg by mouth daily.   11/28/2017 at Unknown time  . Insulin Detemir (LEVEMIR FLEXTOUCH) 100 UNIT/ML Pen Inject 40 Units into the skin 2 (two) times daily after a meal.   11/29/2017 at Unknown time  . INVEGA SUSTENNA 234 MG/1.5ML SUSY injection Inject 234 mg into the muscle every 30 (thirty) days.  7 Past Week at Unknown time  . nicotine polacrilex (NICORETTE) 2 MG gum Take 2 mg by mouth as needed.  0 Past Month at Unknown time  . paliperidone (INVEGA) 3 MG 24 hr tablet Take 1 tablet (3 mg total) by mouth daily. (Patient not taking: Reported on 10/25/2017) 30 tablet 0 Not Taking at Unknown time   Musculoskeletal: Strength & Muscle Tone: within normal limits Gait & Station: normal Patient leans: N/A  Psychiatric Specialty  Exam: Physical Exam  Nursing note and vitals reviewed. Constitutional: She appears well-developed.  HENT:  Head: Normocephalic.  Eyes: Pupils are equal, round, and reactive to light.  Neck: Normal range of motion.  Cardiovascular: Normal rate.  Respiratory: Effort normal.  GI: Soft.  Genitourinary:  Genitourinary Comments: Deferred  Musculoskeletal: Normal range of motion.  Neurological: She  is alert.  Skin: Skin is warm.    Review of Systems  Constitutional: Negative.   HENT: Negative.   Eyes: Negative.   Respiratory: Negative.   Cardiovascular: Negative.   Gastrointestinal: Negative.   Genitourinary: Negative.   Musculoskeletal: Negative.   Skin: Negative.   Neurological: Negative for seizures.  Endo/Heme/Allergies: Negative.   Psychiatric/Behavioral: Positive for depression and suicidal ideas. The patient is nervous/anxious and has insomnia.     Blood pressure (!) 101/54, pulse 66, temperature 98.7 F (37.1 C), temperature source Oral, resp. rate 16, height '5\' 7"'  (1.702 m), weight 108 kg (238 lb 1.6 oz), SpO2 100 %.Body mass index is 37.29 kg/m.  General Appearance: Fairly Groomed, obese, hair shaved to the scalp.  Eye Contact:  Good  Speech:  Clear and Coherent and Normal Rate  Volume:  Normal  Mood:  Anxious and Depressed  Affect:  Flat  Thought Process: Linear, concrete   Orientation:  Full (Time, Place, and Person)  Thought Content:  Logical and Rumination  Suicidal Thoughts:  Currently denies any thoughts, plans or intent. Contracts for safety. Hx suicide attempt by overdose.  Homicidal Thoughts:  Denies any thoughts, plans or intent.  Memory:  Immediate;   Good Recent;   Good Remote;   Good  Judgement:  Fair  Insight:  Fair  Psychomotor Activity: Normal.  Concentration:  Concentration: Good and Attention Span: Good  Recall:  Good  Fund of Knowledge:  Fair  Language:  Good  Akathisia:  Negative  Handed:  Right  AIMS (if indicated):     Assets:  Communication Skills Desire for Improvement Resilience Social Support  ADL's:  Fair   Cognition:  WNL  Sleep:  Number of Hours: 6.25   Treatment Plan/Recommendations: 1. Admit for crisis management and stabilization, estimated length of stay 3-5 days.   2. Medication management to reduce current symptoms to base line and improve the patient's overall level of functioning: See MAR, Md's SRA & treatment  plan.   Observation Level/Precautions:  15 minute checks  Laboratory:  Per ED, UDS/toxicology test reports, clear.  Psychotherapy:    Medications: See MAR.  Consultations: As needed   Discharge Concerns: Safety, mood stability.  Estimated LOS: 5-7 days   Other: Admit to the 500-hall.    Physician Treatment Plan for Primary Diagnosis:  MDD  Long Term Goal(s): Improvement in symptoms so as ready for discharge  Short Term Goals: Ability to verbalize feelings will improve, Ability to disclose and discuss suicidal ideas and Ability to demonstrate self-control will improve  Physician Treatment Plan for Secondary Diagnosis: Active Problems:   Schizophrenia, disorganized ([place])  Long Term Goal(s): Improvement in symptoms so as ready for discharge  Short Term Goals: Ability to identify and develop effective coping behaviors will improve, Compliance with prescribed medications will improve and Ability to identify triggers associated with substance abuse/mental health issues will improve  I certify that inpatient services furnished can reasonably be expected to improve the patient's condition.    Lindell Spar, NP, PMHNP, FNP-BC. 8/7/20198:11 AM   I have reviewed NP's Note, assessement, diagnosis and plan, and agree. I have also met with patient  and completed suicide risk assessment.  Tiffancy Waren is a 27 y/o F with history of schizophrenia, bipolar disorder, OCD, GAD, MDD, and borderline intellectual functioning who was admitted voluntarily from [place] where she presented with 2-day history of worsening intrusive thoughts and compulsion to cut her hair. Pt had called the police and reported SI associated with stressor of finding out her boyfriend has been in contact with his ex-wife. Pt was medically cleared and then transferred to [facility] for additional treatment and stabilization.  Upon initial interview, pt shares, "I'm diagnosed with OCD and I want to stop shaving my head. I've been doing it for  four years now." Pt goes on to share that she has been having SI with plan to overdose on her medications. He denies HI/AH/VH. She cites stressor of strained relationship with her boyfriend of 3 months, and she does not identify other stressors. She is sleeping well. She endorses depressive symptoms of anhedonia, guilty feelings, and low energy. She denies symptoms of mania and PTSD. She reports compulsion to shave her head and eyebrows but she is unable to identify any obsessive thoughts aside from the urge to shave her head; however, from the interview, pt appears to have this compulsive behavior when faced with social stressors such as her strained relationship with her boyfriend. She denies illicit substance use aside from 2-3 ppd of tobacco.  Discussed with patient about treatment options. She reports good follow up with her [facility] ACT team, and good adherence to her outpatient medication regimen.  She reports taking buspar 49m TID, luvox 1026mTID, InLorayne Benderustenna 23487m28 days (last given about 1 week ago), and abilify 41m20m qDay. Pt shares that she has only been on abilify for about 1-2 months, and it was initially helpful for her compulsive behaviors. She has been on her other medications for about 1-2 years and she feels that they have been helpful overall. She is in agreement to increase dose of abilify oral form today with potential of changing to long-acting injectable form if there is good tolerability/effiacy of abilify at larger dose. We will also reach out to pt's ACT team for collateral and to coordinate care. Pt was in agreement with the above plan, and she had no further questions, comments, or concerns.  PLAN OF CARE:   -Admit to inpatient level of care  -Schizophrenia              -Continue Invega Sustenna 234mg62mq28 days (last given about 1 wk ago)  -OCD             -Change abilify 41mg 30mday to abilify 15mg [id number]             -Continue luvox 100mg po66m  -Anxiety              -Continue buspar 15mg po 76m -HTN              -Continue HCTZ 25mg po q59m -DMII             -Continue glipizide 41mg po BI74mth meals             -Continue levemir 40 Units [place] BID after meals  -Agitation              -Continue zydis 5mg po q8h 65m agitation OR geodon 20mg IM q12h67m severe agitation  -Insomnia              -Continue trazodone 100mg33m  po qhs prn insomnia  -Encourage participation in groups and therapeutic milieu  -Disposition planning will be ongoing     [name], MD

## 2017-12-01 NOTE — [facility] Group Notes (Signed)
LCSW Group Therapy Note  12/01/2017 1:15pm  Type of Therapy/Topic:  Group Therapy:  Emotion Regulation  Participation Level:  Did Not Attend   Description of Group:   The purpose of this group is to assist patients in learning to regulate negative emotions and experience positive emotions. Patients will be guided to discuss ways in which they have been vulnerable to their negative emotions. These vulnerabilities will be juxtaposed with experiences of positive emotions or situations, and patients will be challenged to use positive emotions to combat negative ones. Special emphasis will be placed on coping with negative emotions in conflict situations, and patients will process healthy conflict resolution skills.  Therapeutic Goals: 1. Patient will identify two positive emotions or experiences to reflect on in order to balance out negative emotions 2. Patient will label two or more emotions that they find the most difficult to experience 3. Patient will demonstrate positive conflict resolution skills through discussion and/or role plays  Summary of Patient Progress:       Therapeutic Modalities:   Cognitive Behavioral Therapy Feelings Identification [place], LCSW 12/01/2017 11:13 AM

## 2017-12-01 NOTE — Progress Notes (Signed)
D:  Patient's self inventory sheet, patient sleeps good, no sleep medication given.  Good appetite, low energy level, poor concentration.  Rated depression, hopeless and anxiety 10.  Denied withdrawals.  SI, no plan, contracts for safety.  Denied physical problems.  Denied physical pain.  Goal "OCD to stop shaving my head."  A:  Medications administered per MD orders.  Emotional support and encouragement given patient. R:  Denied HI.  Denied A/V hallucinations.  SI, off/on, contracts for safety, no plan.

## 2017-12-02 DIAGNOSIS — F422 Mixed obsessional thoughts and acts: Secondary | ICD-10-CM

## 2017-12-02 LAB — GLUCOSE, CAPILLARY
GLUCOSE-CAPILLARY: 331 mg/dL — AB (ref 70–99)
Glucose-Capillary: 186 mg/dL — ABNORMAL HIGH (ref 70–99)
Glucose-Capillary: 319 mg/dL — ABNORMAL HIGH (ref 70–99)
Glucose-Capillary: 366 mg/dL — ABNORMAL HIGH (ref 70–99)

## 2017-12-02 LAB — PROLACTIN: Prolactin: 36.7 ng/mL — ABNORMAL HIGH (ref 4.8–23.3)

## 2017-12-02 MED ORDER — ARIPIPRAZOLE ER 400 MG IM SRER
400.0000 mg | INTRAMUSCULAR | Status: DC
  Administered 2017-12-03: 400 mg via INTRAMUSCULAR

## 2017-12-02 MED ORDER — GLIPIZIDE ER 10 MG PO TB24
10.0000 mg | ORAL_TABLET | Freq: Two times a day (BID) | ORAL | Status: DC
  Administered 2017-12-02 – 2017-12-06 (×9): 10 mg via ORAL
  Filled 2017-12-02 (×14): qty 1

## 2017-12-02 MED ORDER — ARIPIPRAZOLE 10 MG PO TABS
20.0000 mg | ORAL_TABLET | Freq: Every day | ORAL | Status: DC
  Administered 2017-12-03 – 2017-12-06 (×4): 20 mg via ORAL
  Filled 2017-12-02 (×7): qty 2

## 2017-12-02 MED ORDER — INSULIN GLARGINE 100 UNIT/ML ~~LOC~~ SOLN: 12.0000 [IU] | Freq: Every day | SUBCUTANEOUS | Status: DC

## 2017-12-02 MED ORDER — INSULIN DETEMIR 100 UNIT/ML ~~LOC~~ SOLN
40.0000 [IU] | Freq: Two times a day (BID) | SUBCUTANEOUS | Status: DC
Start: 2017-12-02 — End: 2017-12-06
  Administered 2017-12-02 – 2017-12-06 (×9): 40 [IU] via SUBCUTANEOUS

## 2017-12-02 MED ORDER — INSULIN ASPART 100 UNIT/ML ~~LOC~~ SOLN
0.0000 [IU] | Freq: Every day | SUBCUTANEOUS | Status: DC
  Administered 2017-12-02: 5 [IU] via SUBCUTANEOUS
  Administered 2017-12-02: 3 [IU] via SUBCUTANEOUS
  Administered 2017-12-03: 4 [IU] via SUBCUTANEOUS
  Administered 2017-12-04 – 2017-12-05 (×2): 3 [IU] via SUBCUTANEOUS

## 2017-12-02 MED ORDER — INSULIN ASPART 100 UNIT/ML ~~LOC~~ SOLN
0.0000 [IU] | Freq: Three times a day (TID) | SUBCUTANEOUS | Status: DC
  Administered 2017-12-02: 3 [IU] via SUBCUTANEOUS
  Administered 2017-12-02 (×2): 11 [IU] via SUBCUTANEOUS
  Administered 2017-12-03: 3 [IU] via SUBCUTANEOUS
  Administered 2017-12-03 – 2017-12-04 (×3): 8 [IU] via SUBCUTANEOUS
  Administered 2017-12-04 – 2017-12-05 (×3): 5 [IU] via SUBCUTANEOUS
  Administered 2017-12-05: 3 [IU] via SUBCUTANEOUS
  Administered 2017-12-05: 5 [IU] via SUBCUTANEOUS
  Administered 2017-12-06: 3 [IU] via SUBCUTANEOUS
  Administered 2017-12-06: 5 [IU] via SUBCUTANEOUS

## 2017-12-02 NOTE — Plan of Care (Signed)
  Problem: Activity: Goal: Imbalance in normal sleep/wake cycle will improve Outcome: Not Progressing-Marcel has been in the bed most of the evening.  She did attend wrap up group but then went back to sleep.

## 2017-12-02 NOTE — [facility] Group Notes (Signed)
LCSW Group Therapy Note   12/02/2017 1:15pm   Type of Therapy and Topic:  Group Therapy:  Overcoming Obstacles   Participation Level:  Did Not Attend   Description of Group:    In this group patients will be encouraged to explore what they see as obstacles to their own wellness and recovery. They will be guided to discuss their thoughts, feelings, and behaviors related to these obstacles. The group will process together ways to cope with barriers, with attention given to specific choices patients can make. Each patient will be challenged to identify changes they are motivated to make in order to overcome their obstacles. This group will be process-oriented, with patients participating in exploration of their own experiences as well as giving and receiving support and challenge from other group members.   Therapeutic Goals: 1. Patient will identify personal and current obstacles as they relate to admission. 2. Patient will identify barriers that currently interfere with their wellness or overcoming obstacles.  3. Patient will identify feelings, thought process and behaviors related to these barriers. 4. Patient will identify two changes they are willing to make to overcome these obstacles:      Summary of Patient Progress      Therapeutic Modalities:   Cognitive Behavioral Therapy Solution Focused Therapy Motivational Interviewing Relapse Prevention [place], LCSW 12/02/2017 3:10 PM

## 2017-12-02 NOTE — Progress Notes (Signed)
D: Patient is pleasant with staff.  Her mood is stable.  She is observed pacing the hallway frequently.  Patient asked that her trazodone be increased due to poor sleep.  She denies any thoughts of self harm today.  She is compliant with her medications.  A: Continue to monitor medication management and MD orders.  Safety checks completed every 15 minutes per protocol.  Offer support and encouragement as needed.  R: Patient is receptive to staff; her behavior is appropriate.

## 2017-12-02 NOTE — Progress Notes (Addendum)
Pt is observed in the hallway, talking/singing to staff and peers loudly. Pt attended wrap-up group with prompting. Pt appears animated/anxious in affect and mood. Pt was hyperactive and hyperverbal this evening. Pt needs frequent redirection from staff but is redirectable. Pt states "I became obsessive with shaving my hair after my boyfriend broke up with me 4 years ago". Pt denies SI/AVH/Pain at this time. PRN vistaril and trazodone requested and given. Support and encouragement provided.Will continue with POC.

## 2017-12-02 NOTE — Progress Notes (Signed)
Inpatient Diabetes Program Recommendations  AACE/ADA: New Consensus Statement on Inpatient Glycemic Control (2015)  Target Ranges:  Prepandial:   less than 140 mg/dL      Peak postprandial:   less than 180 mg/dL (1-2 hours)      Critically ill patients:  140 - 180 mg/dL   Lab Results  Component Value Date   GLUCAP 319 (H) 12/02/2017   HGBA1C 9.1 (H) 12/01/2017    Review of Glycemic Control  Diabetes history: DM2 Outpatient Diabetes medications: Levemir 40 units bid, glipizide 10 mg bid Current orders for Inpatient glycemic control: Levemir 40 units bid, glipizide 10 mg bid, Novolog 0-15 units tidwc and hs  9.1% HgbA1C indicates poor glycemic control. Post-prandial blood sugars elevated. Needs meal coverage insulin  Inpatient Diabetes Program Recommendations:     Add Novolog 4 units tidwc for meal coverage insulin  Will need to f/u with endo/PCP for diabetes medication adjustment with HgbA1C of 9.1%.  Will follow closely.  Thank you. Lorenda Peck, RD, LDN, CDE Inpatient Diabetes Coordinator [phone]

## 2017-12-02 NOTE — Progress Notes (Signed)
Recreation Therapy Notes  Date: 8.8.19 Time: 0950 Location: 500 Hall Dayroom  Group Topic:  Goal Setting  Goal Area(s) Addresses:  Patient will be able to identify at least 3 life goals.  Patient will be able to identify benefit of investing in life goals.  Patient will be able to identify benefit of setting life goals post d/c.   Behavioral Response: Engaged  Intervention: Worksheet, pencils  Activity: Life Goals.  Patients were given a work sheet with six categories (family, friends, work/school, spirituality, body and mental health).  Patients were to identify what they were doing well, what they needed to improve and write a goal to help them make the improvement.  Patients would then share their top 3 categories with the group.  Education: Discharge Planning, Radiographer, therapeutic, Leisure Education  Education Outcome: Acknowledges Education/In Group Clarification Provided/Needs Additional Education  Clinical Observations: Pt was able to complete worksheet with assistance from LRT.  Pt stated for mental health she wants to stop shaving her head and get help with her OCD, pt set a goal of going to therapy three times a week and taking her medications; spirituality- listens to [employer], improve going to church and set a goal of going to church two times a week; and family- communicating with them, stop arguing with her mom and set a goal of walking away when they start to argue.   [name], LRT/CTRS    Ria Comment, Takeem Krotzer A 12/02/2017 11:25 AM

## 2017-12-02 NOTE — Progress Notes (Signed)
D:  [name] has been in her room much of the evening.  She did attend evening wrap up group.  She reported feeling sleepy and wanted to go to bed.  She denied SI/HI or A/V hallucinations.   It was reported that her blood sugar was elevated earlier today.  Staffed with [name] PA.  New orders noted to obtain CBG and insulin given per order for blood sugar of 283.  She denied any pain or discomfort and appeared to be in no physical distress.  She is currently resting with her eyes closed and snoring loudly in her room. A:  1:1 with RN for support and encouragement.  Medication as ordered and prn.  Q 15 minute checks maintained for safety.  Encouraged participation in group and unit activities. R:  [place] remains safe on the unit.  We will continue to monitor the progress towards her goals.

## 2017-12-02 NOTE — Progress Notes (Signed)
[facility] MD Progress Note  12/02/2017 2:02 PM [name]  MRN:  [id number]  Subjective: [name] reports, "I'm feeling a little hyper today. A little excited because I'm getting some help. My mood is good. I'm sleeping some, not a lot. I would like my Trazodone to be increased some to help me sleep better. The doctor told me that I will be getting my injections tomorrow. I did not go outside with the other patients because I don't like being around bunch of people. I will be going to the group sessions today".  [name] is a 27 y/o F with history of schizophrenia, bipolar disorder, OCD, GAD, MDD, and borderline intellectual functioning who was admitted voluntarily from [place] where she presented with 2-day history of worsening intrusive thoughts and compulsion to cut her hair. [name] had called the police and reported SI associated with stressor of finding out her boyfriend has been in contact with his ex-wife. [name] was medically cleared and then transferred to [facility] for additional treatment and stabilization. Upon initial interview, [name] shares, "I'm diagnosed with OCD and I want to stop shaving my head. I've been doing it for four years now." [name] goes on to share that she has been having SI with plan to overdose on her medications. She denies HI/AH/VH. She cites stressor of strained relationship with her boyfriend of 3 months, and she does not identify other stressors.  [name] is seen, chart reviewed. The chart findings discussed with the treatment team. She is alert & oriented. She is visible on the unit. Says will be attending group sessions today, she says. She presents today excited & hyper because she knows she is getting the help that she needs. She reports being in a good mood. She says that she did not sleep well last night. Has asked for her Trazodone to be increased. She is taking & tolerating her medications well. She says she is happy she will be receiving her Ablify shot tomorrow. She declines to go  to the court yard with the other patients today. She says she does not like being around a lot of people. However, she says she will be attending group sessions held within the unit. She is in agreement to increase her Abilify from 15 mg to 20 mg & initiate the Abilify maintenna to be given IM tomorrow. She currently denies any other issues or concerns. She also denies any SIHI, AVH.  Principal Problem: OCD (obsessive compulsive disorder)  Diagnosis:   Patient Active Problem List   Diagnosis Date Noted  . 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 Moderate bipolar I disorder, most recent episode depressed ([place]) [F31.32]   . Psychosis ([place]) [F29]   . Adjustment disorder with mixed disturbance of emotions and conduct [F43.25] 08/03/2017  . LGSIL on Pap smear of cervix [R87.612] 02/01/2017  . OCD (obsessive compulsive disorder) [F42.9] 10/05/2016  . Major depressive disorder, recurrent episode, mild ([place]) [F33.0] 05/04/2016  . 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 Learning disability [F81.9] 07/18/2015  . 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 Diabetes mellitus ([place]) [E11.9] 07/18/2015  . MDD (major depressive disorder), recurrent, severe, with psychosis ([place]) [F33.3] 07/18/2015  . Hyperlipidemia [E78.5] 07/18/2015  . Severe episode of recurrent major depressive disorder, without psychotic features ([place]) [F33.2]   . Drug overdose [T50.901A]   . Cognitive deficits [R41.89] 10/12/2012  . Generalized anxiety disorder [F41.1] 06/28/2012   Total Time spent with patient: 25 minutes  Past Psychiatric History: Major depressive disorder, recurrent.  Past Medical History:  Past Medical History:  Diagnosis Date  . Anxiety   . Bipolar 1 disorder ([place])   . Cognitive deficits   . Depression   . Diabetes mellitus without complication ([place])   . Hypertension   . Mental disorder   . Obesity     Past Surgical History:  Procedure Laterality Date  .  CESAREAN SECTION    . CESAREAN SECTION N/A 04/25/2013   Procedure: REPEAT CESAREAN SECTION;  Surgeon: [name], MD;  Location: [facility];  Service: Obstetrics;  Laterality: N/A;  . MASS EXCISION N/A 06/03/2012   Procedure: EXCISION MASS;  Surgeon: [name], MD;  Location: [place];  Service: ENT;  Laterality: N/A;  Excision uvula mass  . TONSILLECTOMY N/A 06/03/2012   Procedure: TONSILLECTOMY;  Surgeon: [name], MD;  Location: [place];  Service: ENT;  Laterality: N/A;  . TONSILLECTOMY     Family History:  Family History  Problem Relation Age of Onset  . Hypertension Mother   . Diabetes Father    Family Psychiatric  History: See H&P Social History:  Social History   Substance and Sexual Activity  Alcohol Use No     Social History   Substance and Sexual Activity  Drug Use No   Comment: Patient denies    Social History   Socioeconomic History  . Marital status: Single    Spouse name: Not on file  . Number of children: Not on file  . Years of education: Not on file  . Highest education level: Not on file  Occupational History  . Not on file  Social Needs  . Financial resource strain: Not on file  . Food insecurity:    Worry: Not on file    Inability: Not on file  . Transportation needs:    Medical: Not on file    Non-medical: Not on file  Tobacco Use  . Smoking status: Current Every Day Smoker    Packs/day: 3.00    Years: 11.00    Pack years: 33.00    Types: Cigarettes  . Smokeless tobacco: Never Used  Substance and Sexual Activity  . Alcohol use: No  . Drug use: No    Comment: Patient denies  . Sexual activity: Yes    Birth control/protection: Implant  Lifestyle  . Physical activity:    Days per week: Not on file    Minutes per session: Not on file  . Stress: Not on file  Relationships  . Social connections:    Talks on phone: Not on file    Gets together: Not on file    Attends religious service: Not on  file    Active member of club or organization: Not on file    Attends meetings of clubs or organizations: Not on file    Relationship status: Not on file  Other Topics Concern  . Not on file  Social History Narrative  . Not on file   Additional Social History:   Sleep: Fair  Appetite:  Good  Current Medications: Current Facility-Administered Medications  Medication Dose Route Frequency Provider Last Rate Last Dose  . acetaminophen (TYLENOL) tablet 650 mg  650 mg Oral Q6H PRN Nanci Pina, FNP      . alum & mag hydroxide-simeth (MAALOX/MYLANTA) 200-200-20 MG/5ML suspension 30 mL  30 mL Oral Q4H PRN Nanci Pina, FNP      . [START ON 12/03/2017] ARIPiprazole (ABILIFY) tablet 20 mg  20 mg Oral Daily  Lindell Spar I, NP      . Derrill Memo ON 12/03/2017] ARIPiprazole ER (ABILIFY MAINTENA) injection 400 mg  400 mg Intramuscular Q28 days Nwoko, Agnes I, NP      . busPIRone (BUSPAR) tablet 15 mg  15 mg Oral TID Nanci Pina, FNP   15 mg at 12/02/17 1103  . fluvoxaMINE (LUVOX) tablet 100 mg  100 mg Oral TID Nanci Pina, FNP   100 mg at 12/02/17 1103  . glipiZIDE (GLUCOTROL XL) 24 hr tablet 10 mg  10 mg Oral BID Laverle Hobby, PA-C   10 mg at 12/02/17 0756  . hydrochlorothiazide (HYDRODIURIL) tablet 25 mg  25 mg Oral Daily Nanci Pina, FNP   25 mg at 12/02/17 0756  . hydrOXYzine (ATARAX/VISTARIL) tablet 25 mg  25 mg Oral TID PRN Nanci Pina, FNP   25 mg at 11/30/17 2039  . insulin aspart (novoLOG) injection 0-15 Units  0-15 Units Subcutaneous TID WC Laverle Hobby, PA-C   11 Units at 12/02/17 1212  . insulin aspart (novoLOG) injection 0-5 Units  0-5 Units Subcutaneous QHS Laverle Hobby, PA-C   3 Units at 12/02/17 0011  . insulin detemir (LEVEMIR) injection 40 Units  40 Units Subcutaneous BID PC Laverle Hobby, PA-C   40 Units at 12/02/17 0805  . magnesium hydroxide (MILK OF MAGNESIA) suspension 30 mL  30 mL Oral Daily PRN Lavina Hamman, Takia S, FNP      . nicotine  polacrilex (NICORETTE) gum 2 mg  2 mg Oral Q1H PRN Pennelope Bracken, MD   2 mg at 12/02/17 0954  . OLANZapine zydis (ZYPREXA) disintegrating tablet 5 mg  5 mg Oral Q8H PRN Pennelope Bracken, MD   5 mg at 12/02/17 1104   Or  . ziprasidone (GEODON) injection 20 mg  20 mg Intramuscular Q12H PRN Pennelope Bracken, MD      . ondansetron (ZOFRAN) tablet 4 mg  4 mg Oral Q8H PRN Lavina Hamman, Takia S, FNP      . pneumococcal 23 valent vaccine (PNU-IMMUNE) injection 0.5 mL  0.5 mL Intramuscular Tomorrow-1000 Pennelope Bracken, MD      . traZODone (DESYREL) tablet 100 mg  100 mg Oral QHS PRN Nanci Pina, FNP   100 mg at 11/30/17 2039   Lab Results:  Results for orders placed or performed during the hospital encounter of 11/30/17 (from the past 48 hour(s))  Glucose, capillary     Status: Abnormal   Collection Time: 12/01/17  6:27 AM  Result Value Ref Range   Glucose-Capillary 296 (H) 70 - 99 mg/dL  Hemoglobin A1c     Status: Abnormal   Collection Time: 12/01/17  6:50 AM  Result Value Ref Range   Hgb A1c MFr Bld 9.1 (H) 4.8 - 5.6 %    Comment: (NOTE) Pre diabetes:          5.7%-6.4% Diabetes:              >6.4% Glycemic control for   <7.0% adults with diabetes    Mean Plasma Glucose 214.47 mg/dL    Comment: Performed at [facility], [place] [address] [place], [place] [address]  Lipid panel     Status: Abnormal   Collection Time: 12/01/17  6:50 AM  Result Value Ref Range   Cholesterol 253 (H) 0 - 200 mg/dL   Triglycerides 319 (H) <150 mg/dL   HDL 42 >40 mg/dL   Total CHOL/HDL Ratio 6.0 RATIO  VLDL 64 (H) 0 - 40 mg/dL   LDL Cholesterol 147 (H) 0 - 99 mg/dL    Comment:        Total Cholesterol/HDL:CHD Risk Coronary Heart Disease Risk Table                     Men   Women  1/2 Average Risk   3.4   3.3  Average Risk       5.0   4.4  2 X Average Risk   9.6   7.1  3 X Average Risk  23.4   11.0        Use the calculated Patient Ratio above and the CHD Risk  Table to determine the patient's CHD Risk.        ATP III CLASSIFICATION (LDL):  <100     mg/dL   Optimal  100-129  mg/dL   Near or Above                    Optimal  130-159  mg/dL   Borderline  160-189  mg/dL   High  >190     mg/dL   Very High Performed at [facility] [address], [place], [place] [address]   Prolactin     Status: Abnormal   Collection Time: 12/01/17  6:50 AM  Result Value Ref Range   Prolactin 36.7 (H) 4.8 - 23.3 ng/mL    Comment: (NOTE) Performed At: [facility], [place] [id number] Rush Farmer MD [id number]   TSH     Status: None   Collection Time: 12/01/17  6:50 AM  Result Value Ref Range   TSH 0.483 0.350 - 4.500 uIU/mL    Comment: Performed by a 3rd Generation assay with a functional sensitivity of <=0.01 uIU/mL. Performed at [facility], [place] [address], [place], [place] [address]   Glucose, capillary     Status: Abnormal   Collection Time: 12/01/17  5:03 PM  Result Value Ref Range   Glucose-Capillary 377 (H) 70 - 99 mg/dL  Glucose, capillary     Status: Abnormal   Collection Time: 12/01/17 11:31 PM  Result Value Ref Range   Glucose-Capillary 283 (H) 70 - 99 mg/dL  Glucose, capillary     Status: Abnormal   Collection Time: 12/02/17  6:18 AM  Result Value Ref Range   Glucose-Capillary 186 (H) 70 - 99 mg/dL  Glucose, capillary     Status: Abnormal   Collection Time: 12/02/17 11:43 AM  Result Value Ref Range   Glucose-Capillary 331 (H) 70 - 99 mg/dL   Blood Alcohol level:  Lab Results  Component Value Date   ETH <10 11/29/2017   ETH <10 70/26/3785    Metabolic Disorder Labs: Lab Results  Component Value Date   HGBA1C 9.1 (H) 12/01/2017   MPG 214.47 12/01/2017   MPG 177 07/18/2015   Lab Results  Component Value Date   PROLACTIN 36.7 (H) 12/01/2017   PROLACTIN 37.3 (H) 07/18/2015   Lab Results  Component Value Date   CHOL 253 (H) 12/01/2017   TRIG 319  (H) 12/01/2017   HDL 42 12/01/2017   CHOLHDL 6.0 12/01/2017   VLDL 64 (H) 12/01/2017   LDLCALC 147 (H) 12/01/2017   LDLCALC UNABLE TO CALCULATE IF TRIGLYCERIDE OVER 400 mg/dL 07/18/2015   Physical Findings: AIMS: Facial and Oral Movements Muscles of Facial Expression: None, normal Lips and Perioral Area: None, normal Jaw: None, normal  Tongue: None, normal,Extremity Movements Upper (arms, wrists, hands, fingers): None, normal Lower (legs, knees, ankles, toes): None, normal, Trunk Movements Neck, shoulders, hips: None, normal, Overall Severity Severity of abnormal movements (highest score from questions above): None, normal Incapacitation due to abnormal movements: None, normal Patient's awareness of abnormal movements (rate only patient's report): No Awareness, Dental Status Current problems with teeth and/or dentures?: No Does patient usually wear dentures?: No  CIWA:  CIWA-Ar Total: 1 COWS:     Musculoskeletal: Strength & Muscle Tone: within normal limits Gait & Station: normal Patient leans: N/A  Psychiatric Specialty Exam: Physical Exam  Nursing note and vitals reviewed.   Review of Systems  Psychiatric/Behavioral: Positive for depression ("Improving") and hallucinations (Hx. psychosis). Negative for memory loss, substance abuse and suicidal ideas. The patient is not nervous/anxious and does not have insomnia.     Blood pressure 120/77, pulse 92, temperature 98.2 F (36.8 C), temperature source Oral, resp. rate 18, height 5\' 7"  (1.702 m), weight 108 kg, SpO2 100 %.Body mass index is 37.29 kg/m.  General Appearance: Casual and Fairly Groomed  Eye Contact:  Good  Speech:  Clear and Coherent and Normal Rate  Volume:  Normal  Mood:  Anxious and Depressed  Affect:  Congruent, Depressed and Flat  Thought Process:  Coherent and Goal Directed  Orientation:  Full (Time, Place, and Person)  Thought Content:  Logical  Suicidal Thoughts:  No  Homicidal Thoughts:  No  Memory:   Immediate;   Fair Recent;   Fair Remote;   Fair  Judgement:  Poor  Insight:  Lacking  Psychomotor Activity:  Normal  Concentration:  Concentration: Fair  Recall:  [employer] of Knowledge:  Fair  Language:  Fair  Akathisia:  No  Handed:    AIMS (if indicated):     Assets:  Resilience Social Support  ADL's:  Intact  Cognition:  WNL  Sleep:  Number of Hours: 6.75     Treatment Plan Summary: Daily contact with patient to assess and evaluate symptoms and progress in treatment.  - Continue inpatient admission.  - Will continue today 12/02/2017 plan as below except where it is noted.  Mood control.     - Increased Abilify from 15 mg po daily to Abilify 20 mg po Q daily.     - Initiated Abilify Maintenna 400 mg IM Q 28 days.  Anxiety.     - Buspar 15 mg po tid.     - Continue Vistaril 25 mg po tid prn.  Depression/OCD.     - Continue Luvox 100 mg po daily.  Agitation/psychosis.     - Continue Olanzapine zydis 5 mg po Q 8 hours prn or     - Geodon 20 mg IM Q 12 hours prn.  Nicotine withdrawal.     - Continue the Nicotine gum 2 mg prn.  Insomnia.     - Increased Trazodone from 100 mg po Q hs to Trazodone 150 mg po Q hs prn.  Other medical issues. DM: Continue the sliding scale insulin coverage per protocol.          - Continue Glucotrol XL 10 mg po daily. HTN: Continue HCTZ 25 mg po daily.    Patient is being encouraged to attend & participate in the group sessions.  Discharge disposition on going.  Lindell Spar, NP, PMHNP, FNP-BC. 12/02/2017, 2:02 PM

## 2017-12-02 NOTE — Progress Notes (Deleted)
Recreation Therapy Notes  Date: 8.8.19 Time: 0950 Location: 500 Hall Dayroom  Group Topic:  Goal Setting  Goal Area(s) Addresses:  Patient will be able to identify at least 3 life goals.  Patient will be able to identify benefit of investing in life goals.  Patient will be able to identify benefit of setting life goals post d/c.   Behavioral Response:  Engaged  Intervention: Worksheet, pencils  Activity: Life Goals.  Patients were given a work sheet with six categories (family, friends, work/school, spirituality, body and mental health).  Patients were to identify what they were doing well, what they needed to improve and write a goal to help them make the improvement.  Patients would then share their top 3 categories with the group.  Education:  Discharge Planning, Radiographer, therapeutic, Leisure Education   Education Outcome: Acknowledges Education/In Group Clarification Provided/Needs Additional Education  Clinical Observations: Pt arrived late but completed worksheet.  Pt stated for family- she takes care of them, needs to improve communication and set a goal of spending one day a week with them; friends- talks to them, improve spending time with them and set a goal of talking to them weekly; and body- eats right, needs to exercise more and set a goal to exercise three times a week to improve health.   [name], LRT/CTRS     Ria Comment, Jacarie Pate A 12/02/2017 11:19 AM

## 2017-12-03 LAB — GLUCOSE, CAPILLARY
GLUCOSE-CAPILLARY: 182 mg/dL — AB (ref 70–99)
GLUCOSE-CAPILLARY: 254 mg/dL — AB (ref 70–99)
Glucose-Capillary: 274 mg/dL — ABNORMAL HIGH (ref 70–99)
Glucose-Capillary: 336 mg/dL — ABNORMAL HIGH (ref 70–99)

## 2017-12-03 MED ORDER — OXCARBAZEPINE 300 MG PO TABS
300.0000 mg | ORAL_TABLET | Freq: Two times a day (BID) | ORAL | Status: DC
  Administered 2017-12-03 – 2017-12-06 (×7): 300 mg via ORAL
  Filled 2017-12-03 (×11): qty 1

## 2017-12-03 NOTE — Plan of Care (Signed)
  Problem: Education: Goal: Emotional status will improve Outcome: Progressing   Problem: Medication: Goal: Compliance with prescribed medication regimen will improve Outcome: Progressing   Problem: Activity: Goal: Interest or engagement in leisure activities will improve Outcome: Progressing  DAR NOTE: Patient presents with anxious affect and hyperactive on the unit.  Observed singing to staff and peers.  Denies suicidal thoughts, auditory and visual hallucinations.  Described energy level as hyper and concentration as good.  States she is looking forward to going home.  Reports improvement in mood.  Rates depression at 3, hopelessness at 2, and anxiety at 2.  Maintained on routine safety checks.  Medications given as prescribed.  Support and encouragement offered as needed.  Attended group and participated.  States goal for today is "to stop shaving my head."  Patient observed socializing with peers in the dayroom.  Patient is safe on the unit.

## 2017-12-03 NOTE — Progress Notes (Signed)
Recreation Therapy Notes  Date: 8.9.19 Time: 1000 Location: 500 Hall Dayroom  Group Topic: Self-Esteem  Goal Area(s) Addresses:  Patient will successfully identify positive attributes about themselves.  Patient will successfully identify benefit of improved self-esteem.   Behavioral Response: Engaged  Intervention: Worksheet  Activity: Education officer, environmental.  Patients were to identify at least three things they were good at, what they like about their appearance, how they have helped others, what they value most, compliments they have received, challenges they have overcome, things that make them unique and how they have made others happy.  Education:  Self-Esteem, Dentist.   Education Outcome: Acknowledges education/In group clarification offered/Needs additional education  Clinical Observations/Feedback: Pt needed constant redirection from singing and being loud.  Pt was feeding off another peer. Pt was able to state she was good at "cooking, cleaning and making people laugh"; helps others by "singing it could've been me"; overcame "shavingmy head"; and makes others happy by talking.     [name], LRT/CTRS     [name] A 12/03/2017 11:34 AM

## 2017-12-03 NOTE — [facility] Group Notes (Signed)
LCSW Group Therapy Note  12/03/2017 1:15pm  Type of Therapy/Topic:  Group Therapy:  Balance in Life  Participation Level:  Active  Description of Group:    This group will address the concept of balance and how it feels and looks when one is unbalanced. Patients will be encouraged to process areas in their lives that are out of balance and identify reasons for remaining unbalanced. Facilitators will guide patients in utilizing problem-solving interventions to address and correct the stressor making their life unbalanced. Understanding and applying boundaries will be explored and addressed for obtaining and maintaining a balanced life. Patients will be encouraged to explore ways to assertively make their unbalanced needs known to significant others in their lives, using other group members and facilitator for support and feedback.  Therapeutic Goals: 1. Patient will identify two or more emotions or situations they have that consume much of in their lives. 2. Patient will identify signs/triggers that life has become out of balance:  3. Patient will identify two ways to set boundaries in order to achieve balance in their lives:  4. Patient will demonstrate ability to communicate their needs through discussion and/or role plays  Summary of Patient Progress:  Stayed for most of the group, engaged while present.  Stated she feels emotionally blanced today. Was able to identify where she feels sadness and anger in her body, and identified singing and talking to someone on the ACT team as her main coping skills.    Therapeutic Modalities:   Cognitive Behavioral Therapy Solution-Focused Therapy Assertiveness Training  Trish Mage, Corazon 12/03/2017 3:35 PM

## 2017-12-03 NOTE — [facility] Suicide Risk Assessment (Signed)
[place] INPATIENT:  Family/Significant Other Suicide Prevention Education  Suicide Prevention Education:  Patient Refusal for Family/Significant Other Suicide Prevention Education: The patient [name] has refused to provide written consent for family/significant other to be provided Family/Significant Other Suicide Prevention Education during admission and/or prior to discharge.  Physician notified.  Trish Mage 12/03/2017, 3:19 PM

## 2017-12-03 NOTE — Progress Notes (Signed)
Pt is observed in the dayroom, attended wrap-up group. Pt appears animated/anxious in affect and mood. Pt was less hyperactive this evening. Pt states she felt sleepy and wanted to take her medications early. Pt denies SI/AVH/Pain at this time. PRN vistaril, zyprexa, trazodone requested and given. Support and encouragement provided.Will continue with POC.

## 2017-12-03 NOTE — Progress Notes (Signed)
[facility] MD Progress Note  12/03/2017 2:28 PM SUANN [name]  MRN:  [id number]  Subjective: [name] reports, "I'm doing better because I'm getting my Abilify short today. I'm sleeping better. I feel energized. I have this song stuck in my head all morning. It goes like this, it could be meeeeeee, eeee".  Tiffancy Chern is a 27 y/o F with history of schizophrenia, bipolar disorder, OCD, GAD, MDD, and borderline intellectual functioning who was admitted voluntarily from [place] where she presented with 2-day history of worsening intrusive thoughts and compulsion to cut her hair. Pt had called the police and reported SI associated with stressor of finding out her boyfriend has been in contact with his ex-wife. Pt was medically cleared and then transferred to [facility] for additional treatment and stabilization. Upon initial interview, pt shares, "I'm diagnosed with OCD and I want to stop shaving my head. I've been doing it for four years now." Pt goes on to share that she has been having SI with plan to overdose on her medications. She denies HI/AH/VH. She cites stressor of strained relationship with her boyfriend of 3 months, and she does not identify other stressors.  Helana is seen, chart reviewed. The chart findings discussed with the treatment team. She is alert & oriented. She is visible on the unit. She presents today exceptionally excited, hyper & over the top happy. She seem manic. She is observed pacing on the unit hall way, asking the same question over over about what to do when she started having the ruminative thoughts about shaving her hair. She is starting to be some what intrusive. She asked to sit in during the treatment team discussion of patient's progress. She was positively re-directed. She is taking & tolerating her medications well. She says she is happy she will be receiving her Ablify shot today. She is attending group sessions held within the unit. She is in agreement to continue her Abilify 20 mg  & the Abilify maintenna will be given IM today. The treatment team has discussed & agreed to add trileptal 300 mg bid to patient's treatment regime for mood stabilization. She currently denies any other issues or concerns. She also denies any SIHI, AVH.  Principal Problem: OCD (obsessive compulsive disorder)  Diagnosis:   Patient Active Problem List   Diagnosis Date Noted  . Schizophrenia, disorganized ([place]) [F20.1] 11/30/2017  . Moderate bipolar I disorder, most recent episode depressed ([place]) [F31.32]   . Psychosis ([place]) [F29]   . Adjustment disorder with mixed disturbance of emotions and conduct [F43.25] 08/03/2017  . LGSIL on Pap smear of cervix [R87.612] 02/01/2017  . OCD (obsessive compulsive disorder) [F42.9] 10/05/2016  . Major depressive disorder, recurrent episode, mild ([place]) [F33.0] 05/04/2016  . 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 Learning disability [F81.9] 07/18/2015  . 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 Diabetes mellitus ([place]) [E11.9] 07/18/2015  . MDD (major depressive disorder), recurrent, severe, with psychosis ([place]) [F33.3] 07/18/2015  . Hyperlipidemia [E78.5] 07/18/2015  . Severe episode of recurrent major depressive disorder, without psychotic features ([place]) [F33.2]   . Drug overdose [T50.901A]   . Cognitive deficits [R41.89] 10/12/2012  . Generalized anxiety disorder [F41.1] 06/28/2012   Total Time spent with patient: 15 minutes  Past Psychiatric History: Major depressive disorder, recurrent.  Past Medical History:  Past Medical History:  Diagnosis Date  . Anxiety   . Bipolar 1 disorder ([place])   . Cognitive deficits   . Depression   . Diabetes mellitus without complication ([place])   .  Hypertension   . Mental disorder   . Obesity     Past Surgical History:  Procedure Laterality Date  . CESAREAN SECTION    . CESAREAN SECTION N/A 04/25/2013   Procedure: REPEAT CESAREAN SECTION;  Surgeon: [name], MD;  Location: [place]  ORS;  Service: Obstetrics;  Laterality: N/A;  . MASS EXCISION N/A 06/03/2012   Procedure: EXCISION MASS;  Surgeon: [name], MD;  Location: [place];  Service: ENT;  Laterality: N/A;  Excision uvula mass  . TONSILLECTOMY N/A 06/03/2012   Procedure: TONSILLECTOMY;  Surgeon: [name], MD;  Location: [place];  Service: ENT;  Laterality: N/A;  . TONSILLECTOMY     Family History:  Family History  Problem Relation Age of Onset  . Hypertension Mother   . Diabetes Father    Family Psychiatric  History: See H&P Social History:  Social History   Substance and Sexual Activity  Alcohol Use No     Social History   Substance and Sexual Activity  Drug Use No   Comment: Patient denies    Social History   Socioeconomic History  . Marital status: Single    Spouse name: Not on file  . Number of children: Not on file  . Years of education: Not on file  . Highest education level: Not on file  Occupational History  . Not on file  Social Needs  . Financial resource strain: Not on file  . Food insecurity:    Worry: Not on file    Inability: Not on file  . Transportation needs:    Medical: Not on file    Non-medical: Not on file  Tobacco Use  . Smoking status: Current Every Day Smoker    Packs/day: 3.00    Years: 11.00    Pack years: 33.00    Types: Cigarettes  . Smokeless tobacco: Never Used  Substance and Sexual Activity  . Alcohol use: No  . Drug use: No    Comment: Patient denies  . Sexual activity: Yes    Birth control/protection: Implant  Lifestyle  . Physical activity:    Days per week: Not on file    Minutes per session: Not on file  . Stress: Not on file  Relationships  . Social connections:    Talks on phone: Not on file    Gets together: Not on file    Attends religious service: Not on file    Active member of club or organization: Not on file    Attends meetings of clubs or organizations: Not on file    Relationship  status: Not on file  Other Topics Concern  . Not on file  Social History Narrative  . Not on file   Additional Social History:   Sleep: Fair  Appetite:  Good  Current Medications: Current Facility-Administered Medications  Medication Dose Route Frequency Provider Last Rate Last Dose  . acetaminophen (TYLENOL) tablet 650 mg  650 mg Oral Q6H PRN Nanci Pina, FNP      . alum & mag hydroxide-simeth (MAALOX/MYLANTA) 200-200-20 MG/5ML suspension 30 mL  30 mL Oral Q4H PRN Nanci Pina, FNP      . ARIPiprazole (ABILIFY) tablet 20 mg  20 mg Oral Daily Lindell Spar I, NP   20 mg at 12/03/17 0751  . ARIPiprazole ER (ABILIFY MAINTENA) injection 400 mg  400 mg Intramuscular Q28 days Lindell Spar I, NP   400 mg at 12/03/17 1210  . busPIRone (BUSPAR) tablet 15  mg  15 mg Oral TID Nanci Pina, FNP   15 mg at 12/03/17 1209  . fluvoxaMINE (LUVOX) tablet 100 mg  100 mg Oral TID Nanci Pina, FNP   100 mg at 12/03/17 1209  . glipiZIDE (GLUCOTROL XL) 24 hr tablet 10 mg  10 mg Oral BID Laverle Hobby, PA-C   10 mg at 12/03/17 0751  . hydrochlorothiazide (HYDRODIURIL) tablet 25 mg  25 mg Oral Daily Nanci Pina, FNP   25 mg at 12/03/17 0751  . hydrOXYzine (ATARAX/VISTARIL) tablet 25 mg  25 mg Oral TID PRN Nanci Pina, FNP   25 mg at 12/02/17 2031  . insulin aspart (novoLOG) injection 0-15 Units  0-15 Units Subcutaneous TID WC [name] E, PA-C   8 Units at 12/03/17 1214  . insulin aspart (novoLOG) injection 0-5 Units  0-5 Units Subcutaneous QHS Laverle Hobby, PA-C   5 Units at 12/02/17 2032  . insulin detemir (LEVEMIR) injection 40 Units  40 Units Subcutaneous BID PC Laverle Hobby, PA-C   40 Units at 12/03/17 0755  . magnesium hydroxide (MILK OF MAGNESIA) suspension 30 mL  30 mL Oral Daily PRN Lavina Hamman, Takia S, FNP      . nicotine polacrilex (NICORETTE) gum 2 mg  2 mg Oral Q1H PRN Pennelope Bracken, MD   2 mg at 12/03/17 1256  . OLANZapine zydis (ZYPREXA)  disintegrating tablet 5 mg  5 mg Oral Q8H PRN Pennelope Bracken, MD   5 mg at 12/03/17 0755   Or  . ziprasidone (GEODON) injection 20 mg  20 mg Intramuscular Q12H PRN Pennelope Bracken, MD      . ondansetron (ZOFRAN) tablet 4 mg  4 mg Oral Q8H PRN Nanci Pina, FNP      . Oxcarbazepine (TRILEPTAL) tablet 300 mg  300 mg Oral BID [name] T, MD      . pneumococcal 23 valent vaccine (PNU-IMMUNE) injection 0.5 mL  0.5 mL Intramuscular Tomorrow-1000 Pennelope Bracken, MD      . traZODone (DESYREL) tablet 100 mg  100 mg Oral QHS PRN Nanci Pina, FNP   100 mg at 12/02/17 2031   Lab Results:  Results for orders placed or performed during the hospital encounter of 11/30/17 (from the past 48 hour(s))  Glucose, capillary     Status: Abnormal   Collection Time: 12/01/17  5:03 PM  Result Value Ref Range   Glucose-Capillary 377 (H) 70 - 99 mg/dL  Glucose, capillary     Status: Abnormal   Collection Time: 12/01/17 11:31 PM  Result Value Ref Range   Glucose-Capillary 283 (H) 70 - 99 mg/dL  Glucose, capillary     Status: Abnormal   Collection Time: 12/02/17  6:18 AM  Result Value Ref Range   Glucose-Capillary 186 (H) 70 - 99 mg/dL  Glucose, capillary     Status: Abnormal   Collection Time: 12/02/17 11:43 AM  Result Value Ref Range   Glucose-Capillary 331 (H) 70 - 99 mg/dL  Glucose, capillary     Status: Abnormal   Collection Time: 12/02/17  4:46 PM  Result Value Ref Range   Glucose-Capillary 319 (H) 70 - 99 mg/dL  Glucose, capillary     Status: Abnormal   Collection Time: 12/02/17  7:57 PM  Result Value Ref Range   Glucose-Capillary 366 (H) 70 - 99 mg/dL   Comment 1 Notify RN   Glucose, capillary     Status: Abnormal   Collection  Time: 12/03/17  5:59 AM  Result Value Ref Range   Glucose-Capillary 182 (H) 70 - 99 mg/dL  Glucose, capillary     Status: Abnormal   Collection Time: 12/03/17 11:55 AM  Result Value Ref Range   Glucose-Capillary 254 (H) 70  - 99 mg/dL   Comment 1 Notify RN    Comment 2 Document in Chart    Blood Alcohol level:  Lab Results  Component Value Date   ETH <10 11/29/2017   ETH <10 00/86/7619   Metabolic Disorder Labs: Lab Results  Component Value Date   HGBA1C 9.1 (H) 12/01/2017   MPG 214.47 12/01/2017   MPG 177 07/18/2015   Lab Results  Component Value Date   PROLACTIN 36.7 (H) 12/01/2017   PROLACTIN 37.3 (H) 07/18/2015   Lab Results  Component Value Date   CHOL 253 (H) 12/01/2017   TRIG 319 (H) 12/01/2017   HDL 42 12/01/2017   CHOLHDL 6.0 12/01/2017   VLDL 64 (H) 12/01/2017   LDLCALC 147 (H) 12/01/2017   LDLCALC UNABLE TO CALCULATE IF TRIGLYCERIDE OVER 400 mg/dL 07/18/2015   Physical Findings: AIMS: Facial and Oral Movements Muscles of Facial Expression: None, normal Lips and Perioral Area: None, normal Jaw: None, normal Tongue: None, normal,Extremity Movements Upper (arms, wrists, hands, fingers): None, normal Lower (legs, knees, ankles, toes): None, normal, Trunk Movements Neck, shoulders, hips: None, normal, Overall Severity Severity of abnormal movements (highest score from questions above): None, normal Incapacitation due to abnormal movements: None, normal Patient's awareness of abnormal movements (rate only patient's report): No Awareness, Dental Status Current problems with teeth and/or dentures?: No Does patient usually wear dentures?: No  CIWA:  CIWA-Ar Total: 1 COWS:     Musculoskeletal: Strength & Muscle Tone: within normal limits Gait & Station: normal Patient leans: N/A  Psychiatric Specialty Exam: Physical Exam  Nursing note and vitals reviewed.   Review of Systems  Psychiatric/Behavioral: Positive for depression ("Improving") and hallucinations (Hx. psychosis). Negative for memory loss, substance abuse and suicidal ideas. The patient is not nervous/anxious and does not have insomnia.     Blood pressure 117/84, pulse 91, temperature 97.8 F (36.6 C), temperature  source Oral, resp. rate 18, height 5\' 7"  (1.702 m), weight 108 kg, SpO2 100 %.Body mass index is 37.29 kg/m.  General Appearance: Casual and Fairly Groomed  Eye Contact:  Good  Speech:  Clear and Coherent and Normal Rate  Volume:  Normal  Mood:  Anxious and Depressed  Affect:  Congruent, Depressed and Flat  Thought Process:  Coherent and Goal Directed  Orientation:  Full (Time, Place, and Person)  Thought Content:  Logical  Suicidal Thoughts:  No  Homicidal Thoughts:  No  Memory:  Immediate;   Fair Recent;   Fair Remote;   Fair  Judgement:  Poor  Insight:  Lacking  Psychomotor Activity:  Normal  Concentration:  Concentration: Fair  Recall:  [employer] of Knowledge:  Fair  Language:  Fair  Akathisia:  No  Handed:    AIMS (if indicated):     Assets:  Resilience Social Support  ADL's:  Intact  Cognition:  WNL  Sleep:  Number of Hours: 6.75     Treatment Plan Summary: Daily contact with patient to assess and evaluate symptoms and progress in treatment.  - Continue inpatient admission.  - Will continue today 12/03/2017 plan as below except where it is noted.  Mood control.     - Continue Abilify from 15 mg po daily to Abilify  20 mg po Q daily.     - Continue Abilify Maintenna 400 mg IM Q 28 days.  Mood stabilization.     - Initiated Trileptal 300 mg po bid.  Anxiety.     - Buspar 15 mg po tid.     - Continue Vistaril 25 mg po tid prn.  Depression/OCD.     - Continue Luvox 100 mg po daily.  Agitation/psychosis.     - Continue Olanzapine zydis 5 mg po Q 8 hours prn or     - Geodon 20 mg IM Q 12 hours prn.  Nicotine withdrawal.     - Continue the Nicotine gum 2 mg prn.  Insomnia.     - Continue Trazodone from 100 mg po Q hs to Trazodone 150 mg po Q hs prn.  Other medical issues. DM: Continue the sliding scale insulin coverage per protocol.          - Continue Glucotrol XL 10 mg po daily. HTN: Continue HCTZ 25 mg po daily.    Patient is being encouraged to  attend & participate in the group sessions.  Discharge disposition on going.  Lindell Spar, NP, PMHNP, FNP-BC. 12/03/2017, 2:28 PMPatient ID: [name], female   DOB: 06-02-1990, 27 y.o.   MRN: [id number]

## 2017-12-04 LAB — COMPREHENSIVE METABOLIC PANEL
ALT: 25 U/L (ref 0–44)
AST: 30 U/L (ref 15–41)
Albumin: 3.8 g/dL (ref 3.5–5.0)
Alkaline Phosphatase: 49 U/L (ref 38–126)
Anion gap: 13 (ref 5–15)
BILIRUBIN TOTAL: 0.6 mg/dL (ref 0.3–1.2)
BUN: 10 mg/dL (ref 6–20)
CHLORIDE: 98 mmol/L (ref 98–111)
CO2: 25 mmol/L (ref 22–32)
CREATININE: 1.18 mg/dL — AB (ref 0.44–1.00)
Calcium: 9.2 mg/dL (ref 8.9–10.3)
Glucose, Bld: 299 mg/dL — ABNORMAL HIGH (ref 70–99)
Potassium: 3.5 mmol/L (ref 3.5–5.1)
Sodium: 136 mmol/L (ref 135–145)
TOTAL PROTEIN: 6.7 g/dL (ref 6.5–8.1)

## 2017-12-04 LAB — GLUCOSE, CAPILLARY
GLUCOSE-CAPILLARY: 217 mg/dL — AB (ref 70–99)
GLUCOSE-CAPILLARY: 248 mg/dL — AB (ref 70–99)
GLUCOSE-CAPILLARY: 300 mg/dL — AB (ref 70–99)
Glucose-Capillary: 270 mg/dL — ABNORMAL HIGH (ref 70–99)

## 2017-12-04 MED ORDER — BENZTROPINE MESYLATE 1 MG PO TABS
1.0000 mg | ORAL_TABLET | Freq: Two times a day (BID) | ORAL | Status: DC | PRN
  Administered 2017-12-04 – 2017-12-05 (×2): 1 mg via ORAL
  Filled 2017-12-04 (×2): qty 1

## 2017-12-04 NOTE — Progress Notes (Addendum)
[facility] MD Progress Note  12/04/2017 1:28 PM FREDIA CHITTENDEN  MRN:  [id number]  Subjective: [name] reports, "my thinking is getting better.  My OCD is getting better.  I still keep singing that song it could have been me.".  [name] is a 27 y/o F with history of schizophrenia, bipolar disorder, OCD, GAD, MDD, and borderline intellectual functioning who was admitted voluntarily from [place] where she presented with 2-day history of worsening intrusive thoughts and compulsion to cut her hair. Pt had called the police and reported SI associated with stressor of finding out her boyfriend has been in contact with his ex-wife. Pt was medically cleared and then transferred to [facility] for additional treatment and stabilization.   [name] is seen, chart reviewed. The chart findings discussed with the treatment team. She is alert & oriented.  At the time of this evaluation patient is observed thinking loudly in the hallway, she continues to exhibit a significant amount of distractibility, increased energy, and excessively talkative.  As per reports she continues to focus on her OCD and shaving of her head.  She also continues to sing the same phone.  However as per patient report she is resting better, and eating better.  She denies any hallucinations, delusions, paranoia, intrusive thoughts, suicidal ideations, and or homicidal ideations.  She is able to identify her goal today as not shaving her head.  She is also able to offer some insight by noting that she feels much better and glad she is getting the help that she needed a long time ago. She is taking & tolerating her medications well.  Yesterday she was seeing her Abilify injection, and denies any side effects and or adverse reactions to this medication.  She says she is happy she will be receiving her Ablify shot today. She is attending group sessions held within the unit. She is in agreement to continue her Abilify 20 mg & the Abilify maintenna will be given IM  today. The treatment team has discussed & agreed to add trileptal 300 mg bid to patient's treatment regime for mood stabilization. She currently denies any other issues or concerns. She also denies any SIHI, AVH.  Principal Problem: OCD (obsessive compulsive disorder)  Diagnosis:   Patient Active Problem List   Diagnosis Date Noted  . Schizophrenia, disorganized ([place]) [F20.1] 11/30/2017  . Moderate bipolar I disorder, most recent episode depressed ([place]) [F31.32]   . Psychosis ([place]) [F29]   . Adjustment disorder with mixed disturbance of emotions and conduct [F43.25] 08/03/2017  . LGSIL on Pap smear of cervix [R87.612] 02/01/2017  . OCD (obsessive compulsive disorder) [F42.9] 10/05/2016  . Major depressive disorder, recurrent episode, mild ([name]) [F33.0] 05/04/2016  . 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 Learning disability [F81.9] 07/18/2015  . 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 Diabetes mellitus ([place]) [E11.9] 07/18/2015  . MDD (major depressive disorder), recurrent, severe, with psychosis ([place]) [F33.3] 07/18/2015  . Hyperlipidemia [E78.5] 07/18/2015  . Severe episode of recurrent major depressive disorder, without psychotic features ([place]) [F33.2]   . Drug overdose [T50.901A]   . Cognitive deficits [R41.89] 10/12/2012  . Generalized anxiety disorder [F41.1] 06/28/2012   Total Time spent with patient: 15 minutes  Past Psychiatric History: Major depressive disorder, recurrent.  Past Medical History:  Past Medical History:  Diagnosis Date  . Anxiety   . Bipolar 1 disorder ([place])   . Cognitive deficits   . Depression   . Diabetes mellitus without complication ([place])   . Hypertension   .  Mental disorder   . Obesity     Past Surgical History:  Procedure Laterality Date  . CESAREAN SECTION    . CESAREAN SECTION N/A 04/25/2013   Procedure: REPEAT CESAREAN SECTION;  Surgeon: [name], MD;  Location: [facility];  Service: Obstetrics;  Laterality:  N/A;  . MASS EXCISION N/A 06/03/2012   Procedure: EXCISION MASS;  Surgeon: [name], MD;  Location: [place];  Service: ENT;  Laterality: N/A;  Excision uvula mass  . TONSILLECTOMY N/A 06/03/2012   Procedure: TONSILLECTOMY;  Surgeon: [name], MD;  Location: [place];  Service: ENT;  Laterality: N/A;  . TONSILLECTOMY     Family History:  Family History  Problem Relation Age of Onset  . Hypertension Mother   . Diabetes Father    Family Psychiatric  History: See H&P Social History:  Social History   Substance and Sexual Activity  Alcohol Use No     Social History   Substance and Sexual Activity  Drug Use No   Comment: Patient denies    Social History   Socioeconomic History  . Marital status: Single    Spouse name: Not on file  . Number of children: Not on file  . Years of education: Not on file  . Highest education level: Not on file  Occupational History  . Not on file  Social Needs  . Financial resource strain: Not on file  . Food insecurity:    Worry: Not on file    Inability: Not on file  . Transportation needs:    Medical: Not on file    Non-medical: Not on file  Tobacco Use  . Smoking status: Current Every Day Smoker    Packs/day: 3.00    Years: 11.00    Pack years: 33.00    Types: Cigarettes  . Smokeless tobacco: Never Used  Substance and Sexual Activity  . Alcohol use: No  . Drug use: No    Comment: Patient denies  . Sexual activity: Yes    Birth control/protection: Implant  Lifestyle  . Physical activity:    Days per week: Not on file    Minutes per session: Not on file  . Stress: Not on file  Relationships  . Social connections:    Talks on phone: Not on file    Gets together: Not on file    Attends religious service: Not on file    Active member of club or organization: Not on file    Attends meetings of clubs or organizations: Not on file    Relationship status: Not on file  Other Topics  Concern  . Not on file  Social History Narrative  . Not on file   Additional Social History:   Sleep: Fair  Appetite:  Good  Current Medications: Current Facility-Administered Medications  Medication Dose Route Frequency Provider Last Rate Last Dose  . acetaminophen (TYLENOL) tablet 650 mg  650 mg Oral Q6H PRN Nanci Pina, FNP      . alum & mag hydroxide-simeth (MAALOX/MYLANTA) 200-200-20 MG/5ML suspension 30 mL  30 mL Oral Q4H PRN Nanci Pina, FNP      . ARIPiprazole (ABILIFY) tablet 20 mg  20 mg Oral Daily Lindell Spar I, NP   20 mg at 12/04/17 0906  . ARIPiprazole ER (ABILIFY MAINTENA) injection 400 mg  400 mg Intramuscular Q28 days Lindell Spar I, NP   400 mg at 12/03/17 1210  . busPIRone (BUSPAR) tablet 15 mg  15 mg  Oral TID Nanci Pina, FNP   15 mg at 12/04/17 1210  . fluvoxaMINE (LUVOX) tablet 100 mg  100 mg Oral TID Nanci Pina, FNP   100 mg at 12/04/17 1210  . glipiZIDE (GLUCOTROL XL) 24 hr tablet 10 mg  10 mg Oral BID Laverle Hobby, PA-C   10 mg at 12/04/17 7371  . hydrochlorothiazide (HYDRODIURIL) tablet 25 mg  25 mg Oral Daily Nanci Pina, FNP   25 mg at 12/04/17 0626  . hydrOXYzine (ATARAX/VISTARIL) tablet 25 mg  25 mg Oral TID PRN Nanci Pina, FNP   25 mg at 12/04/17 0944  . insulin aspart (novoLOG) injection 0-15 Units  0-15 Units Subcutaneous TID WC [name] E, PA-C   5 Units at 12/04/17 1210  . insulin aspart (novoLOG) injection 0-5 Units  0-5 Units Subcutaneous QHS Laverle Hobby, PA-C   4 Units at 12/03/17 2041  . insulin detemir (LEVEMIR) injection 40 Units  40 Units Subcutaneous BID PC Laverle Hobby, PA-C   40 Units at 12/04/17 0908  . magnesium hydroxide (MILK OF MAGNESIA) suspension 30 mL  30 mL Oral Daily PRN Lavina Hamman, Takia S, FNP      . nicotine polacrilex (NICORETTE) gum 2 mg  2 mg Oral Q1H PRN Pennelope Bracken, MD   2 mg at 12/04/17 1303  . OLANZapine zydis (ZYPREXA) disintegrating tablet 5 mg  5 mg Oral Q8H  PRN Pennelope Bracken, MD   5 mg at 12/04/17 1135   Or  . ziprasidone (GEODON) injection 20 mg  20 mg Intramuscular Q12H PRN Pennelope Bracken, MD      . ondansetron (ZOFRAN) tablet 4 mg  4 mg Oral Q8H PRN Nanci Pina, FNP      . Oxcarbazepine (TRILEPTAL) tablet 300 mg  300 mg Oral BID Pennelope Bracken, MD   300 mg at 12/04/17 0906  . pneumococcal 23 valent vaccine (PNU-IMMUNE) injection 0.5 mL  0.5 mL Intramuscular Tomorrow-1000 Pennelope Bracken, MD      . traZODone (DESYREL) tablet 100 mg  100 mg Oral QHS PRN Nanci Pina, FNP   100 mg at 12/03/17 2042   Lab Results:  Results for orders placed or performed during the hospital encounter of 11/30/17 (from the past 48 hour(s))  Glucose, capillary     Status: Abnormal   Collection Time: 12/02/17  4:46 PM  Result Value Ref Range   Glucose-Capillary 319 (H) 70 - 99 mg/dL  Glucose, capillary     Status: Abnormal   Collection Time: 12/02/17  7:57 PM  Result Value Ref Range   Glucose-Capillary 366 (H) 70 - 99 mg/dL   Comment 1 Notify RN   Glucose, capillary     Status: Abnormal   Collection Time: 12/03/17  5:59 AM  Result Value Ref Range   Glucose-Capillary 182 (H) 70 - 99 mg/dL  Glucose, capillary     Status: Abnormal   Collection Time: 12/03/17 11:55 AM  Result Value Ref Range   Glucose-Capillary 254 (H) 70 - 99 mg/dL   Comment 1 Notify RN    Comment 2 Document in Chart   Glucose, capillary     Status: Abnormal   Collection Time: 12/03/17  5:10 PM  Result Value Ref Range   Glucose-Capillary 274 (H) 70 - 99 mg/dL   Comment 1 Notify RN    Comment 2 Document in Chart   Glucose, capillary     Status: Abnormal   Collection Time:  12/03/17  8:39 PM  Result Value Ref Range   Glucose-Capillary 336 (H) 70 - 99 mg/dL  Glucose, capillary     Status: Abnormal   Collection Time: 12/04/17  6:26 AM  Result Value Ref Range   Glucose-Capillary 217 (H) 70 - 99 mg/dL  Glucose, capillary     Status: Abnormal    Collection Time: 12/04/17 11:56 AM  Result Value Ref Range   Glucose-Capillary 248 (H) 70 - 99 mg/dL   Blood Alcohol level:  Lab Results  Component Value Date   ETH <10 11/29/2017   ETH <10 69/45/0388   Metabolic Disorder Labs: Lab Results  Component Value Date   HGBA1C 9.1 (H) 12/01/2017   MPG 214.47 12/01/2017   MPG 177 07/18/2015   Lab Results  Component Value Date   PROLACTIN 36.7 (H) 12/01/2017   PROLACTIN 37.3 (H) 07/18/2015   Lab Results  Component Value Date   CHOL 253 (H) 12/01/2017   TRIG 319 (H) 12/01/2017   HDL 42 12/01/2017   CHOLHDL 6.0 12/01/2017   VLDL 64 (H) 12/01/2017   LDLCALC 147 (H) 12/01/2017   LDLCALC UNABLE TO CALCULATE IF TRIGLYCERIDE OVER 400 mg/dL 07/18/2015   Physical Findings: AIMS: Facial and Oral Movements Muscles of Facial Expression: None, normal Lips and Perioral Area: None, normal Jaw: None, normal Tongue: None, normal,Extremity Movements Upper (arms, wrists, hands, fingers): None, normal Lower (legs, knees, ankles, toes): None, normal, Trunk Movements Neck, shoulders, hips: None, normal, Overall Severity Severity of abnormal movements (highest score from questions above): None, normal Incapacitation due to abnormal movements: None, normal Patient's awareness of abnormal movements (rate only patient's report): No Awareness, Dental Status Current problems with teeth and/or dentures?: No Does patient usually wear dentures?: No  CIWA:  CIWA-Ar Total: 1 COWS:  COWS Total Score: 1  Musculoskeletal: Strength & Muscle Tone: within normal limits Gait & Station: normal Patient leans: N/A  Psychiatric Specialty Exam: Physical Exam  Nursing note and vitals reviewed.   Review of Systems  Psychiatric/Behavioral: Positive for depression ("Improving") and hallucinations (Hx. psychosis). Negative for memory loss, substance abuse and suicidal ideas. The patient is not nervous/anxious and does not have insomnia.     Blood pressure 119/83,  pulse 98, temperature 98.2 F (36.8 C), temperature source Oral, resp. rate 20, height 5\' 7"  (1.702 m), weight 108 kg, SpO2 100 %.Body mass index is 37.29 kg/m.  General Appearance: Casual and Fairly Groomed  Eye Contact:  Good  Speech:  Clear and Coherent and Normal Rate  Volume:  Normal  Mood: Euphoric  Affect:  Congruent  Thought Process:  Coherent and Goal Directed  Orientation:  Full (Time, Place, and Person)  Thought Content:  Logical  Suicidal Thoughts:  No  Homicidal Thoughts:  No  Memory:  Immediate;   Fair Recent;   Fair Remote;   Fair  Judgement:  Poor  Insight:  Lacking  Psychomotor Activity:  Normal  Concentration:  Concentration: Fair  Recall:  [employer] of Knowledge:  Fair  Language:  Fair  Akathisia:  No  Handed:    AIMS (if indicated):     Assets:  Resilience Social Support  ADL's:  Intact  Cognition:  WNL  Sleep:  Number of Hours: 6.75     Treatment Plan Summary: Daily contact with patient to assess and evaluate symptoms and progress in treatment.  - Continue inpatient admission.  - Will continue today 12/04/2017 plan as below except where it is noted.  Mood control.     -  Continue Abilify from 15 mg po daily to Abilify 20 mg po Q daily.     - Continue Abilify Maintenna 400 mg IM Q 28 days.  Mood stabilization.     - Initiated Trileptal 300 mg po bid.  Anxiety.     - Buspar 15 mg po tid.     - Continue Vistaril 25 mg po tid prn.  Depression/OCD.     - Continue Luvox 100 mg po daily.  Agitation/psychosis.     - Continue Olanzapine zydis 5 mg po Q 8 hours prn or     - Geodon 20 mg IM Q 12 hours prn.  Nicotine withdrawal.     - Continue the Nicotine gum 2 mg prn.  Insomnia.     - Continue Trazodone from 100 mg po Q hs to Trazodone 150 mg po Q hs prn.  Other medical issues. DM: Continue the sliding scale insulin coverage per protocol.          - Continue Glucotrol XL 10 mg po daily. HTN: Continue HCTZ 25 mg po daily.    Patient is  being encouraged to attend & participate in the group sessions.  Discharge disposition on going.  Nanci Pina, FNP, FNP-BC. 12/04/2017, 1:28 PMPatient ID: [name], female   DOB: [birth year], 27 y.o.   MRN: [id number] .Agree with NP Progress Note

## 2017-12-04 NOTE — [facility] Group Notes (Signed)
  [facility] LCSW Group Therapy Note  Date/Time:  12/04/2017 11:15AM-12:00PM  Type of Therapy and Topic:  Group Therapy:  Feelings About Hospitalization  Participation Level:  Minimal   Description of Group This process group involved patients discussing their feelings related to being hospitalized, as well as the benefits they see to being in the hospital.  These feelings and benefits were itemized.  The group then brainstormed specific ways in which they could seek those same benefits when they discharge and return home.  Therapeutic Goals 1. Patient will identify and describe positive and negative feelings related to hospitalization 2. Patient will verbalize benefits of hospitalization to themselves personally 3. Patients will brainstorm together ways they can obtain similar benefits in the outpatient setting, identify barriers to wellness and possible solutions  Summary of Patient Progress:  The patient expressed her primary feelings about being hospitalized are "it's a loss of freedom" and said "I'm about to go crazy up in here."  She was angry, saying the nurses are rude because when she sings her songs "I have to, because I'm bipolar," they will ask her to move away from the nurses' station to sing.  She talked about walking a lot normally, and how she cannot do that in the hospital, so she has an overabundance of pent-up energy.  She was in and out of the room numerous times, singing each time very loudly.  Therapeutic Modalities Cognitive Behavioral Therapy Motivational Interviewing    Selmer Dominion, LCSW 12/04/2017, 1:23 PM

## 2017-12-04 NOTE — Plan of Care (Addendum)
D: Patient presents animated, manic, elevated, euphoric, compulsive. Patient continues to report compulsions of feeling the need to move things around in her room and organize. She is animated, and appears restless, frequently pacing around the unit. She sings songs repetitively, and makes occasional clang associations. She is hypersexual and talks about female genitalia and sex at the nurse's station. Her sleep is good, she got 6.75 hr last night. She received medication and felt it was helpful. Her concentration is good. She rates her depression and hopelessness 0/10. Patient denies SI/HI/AVH.  A: Administered vistaril once this morning for anxiety, and Zyprexa Zydis a little later for increased agitation, psychosis and elevated mood. Patient checked q15 min, and checks reviewed. Reviewed medication changes with patient and educated on side effects. Educated patient on importance of attending group therapy sessions and educated on several coping skills. Encouarged participation in milieu through recreation therapy and attending meals with peers. Support and encouragement provided. Fluids offered. R: Patient receptive to education on medications, and is medication compliant. Her goal is to "stop shaving my head." Patient contracts for safety on the unit.

## 2017-12-04 NOTE — [facility] Group Notes (Signed)
[place] Group Notes:  (Nursing/MHT/Case Management/Adjunct)  Date:  12/04/2017  Time:  4:45 PM  Type of Therapy:  Nurse Education  Participation Level:  Minimal  Participation Quality:  Appropriate and Attentive  Affect:  Appropriate  Cognitive:  Appropriate  Insight:  Lacking  Engagement in Group:  Limited  Modes of Intervention:  Activity  Summary of Progress/Problems: In this group, we explored meditation by listening to and practicing a guided meditation. We then discussed where each patient was born, and tried to spark conversation. The patient attended the last few minutes of the group therapy session, but did participate by sharing where she used to live.  [name] 12/04/2017, 4:45 PM

## 2017-12-04 NOTE — Progress Notes (Addendum)
Pt is observed in the dayroom, attended wrap-up group. Pt appears animated/anxious/silly in affect and mood. Increase hyperactivity this evening. Pt was singing loudly and snapping fingers in the hallway.Pt requires frequent redirection but is easily redirectable. Pt denies SI/AVH/Pain at this time. Pt c/o of worsening insomnia. Pt was encourage to speak with provider tomorrow.PRN vistaril, zyprexa, trazodone, nicorette gum requested and given.Support and encouragement provided.Will continue with POC.

## 2017-12-05 LAB — GLUCOSE, CAPILLARY
GLUCOSE-CAPILLARY: 230 mg/dL — AB (ref 70–99)
GLUCOSE-CAPILLARY: 152 mg/dL — AB (ref 70–99)
GLUCOSE-CAPILLARY: 211 mg/dL — AB (ref 70–99)
Glucose-Capillary: 267 mg/dL — ABNORMAL HIGH (ref 70–99)

## 2017-12-05 NOTE — Progress Notes (Addendum)
[name] MD Progress Note  12/05/2017 1:33 PM [name]  MRN:  [id number]  Subjective: [name] reports, "too hyper and need to turn it down.  I think I need to go to a group home.  Per my mom wants me to go home stating that they would take my check away.  She reports to need to keep my apartment..".  [name] is a 27 y/o F with history of schizophrenia, bipolar disorder, OCD, GAD, MDD, and borderline intellectual functioning who was admitted voluntarily from [place] where she presented with 2-day history of worsening intrusive thoughts and compulsion to cut her hair. Pt had called the police and reported SI associated with stressor of finding out her boyfriend has been in contact with his ex-wife. Pt was medically cleared and then transferred to [facility] for additional treatment and stabilization.   [name] is seen, chart reviewed. The chart findings discussed with the treatment team. She is alert & oriented.  At the time of this evaluation patient is observed thinking loudly in the hallway, she continues to exhibit a significant amount of distractibility, increased energy, and excessively talkative.  She continues to have ongoing flight of ideas, hyper activity, restlessness, and ongoing mania symptoms.  At this time she has attended all groups, been compliant with medication, would suggest beginning referral process for group home.  Suggested patient do a pros and cons and risks for home versus group home.  With some assistance she was able to complete in a timely manner with no written assistance at home.  Patient reports having urges yesterday to shave her head, however she did not.  Patient is congratulated on her progress.  As per reports she continues to focus on her OCD and shaving of her head.  She also continues to sing the same phone.  However as per patient report she is resting better, and eating better.  She denies any hallucinations, delusions, paranoia, intrusive thoughts, suicidal ideations,  and or homicidal ideations.  She is able to identify her goal today as not shaving her head.  She is also able to offer some insight by noting that she feels much better and glad she is getting the help that she needed a long time ago. She is taking & tolerating her medications well. The treatment team has discussed & agreed to add trileptal 300 mg bid to patient's treatment regime for mood stabilization. She currently denies any other issues or concerns. She also denies any SIHI, AVH.  Principal Problem: OCD (obsessive compulsive disorder)  Diagnosis:   Patient Active Problem List   Diagnosis Date Noted  . 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 Moderate bipolar I disorder, most recent episode depressed ([place]) [F31.32]   . Psychosis ([place]) [F29]   . Adjustment disorder with mixed disturbance of emotions and conduct [F43.25] 08/03/2017  . LGSIL on Pap smear of cervix [R87.612] 02/01/2017  . OCD (obsessive compulsive disorder) [F42.9] 10/05/2016  . Major depressive disorder, recurrent episode, mild ([place]) [F33.0] 05/04/2016  . 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 Learning disability [F81.9] 07/18/2015  . 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 Diabetes mellitus ([place]) [E11.9] 07/18/2015  . MDD (major depressive disorder), recurrent, severe, with psychosis ([place]) [F33.3] 07/18/2015  . Hyperlipidemia [E78.5] 07/18/2015  . Severe episode of recurrent major depressive disorder, without psychotic features ([place]) [F33.2]   . Drug overdose [T50.901A]   . Cognitive deficits [R41.89] 10/12/2012  . Generalized anxiety disorder [F41.1] 06/28/2012   Total Time spent with patient: 15  minutes  Past Psychiatric History: Major depressive disorder, recurrent.  Past Medical History:  Past Medical History:  Diagnosis Date  . Anxiety   . Bipolar 1 disorder ([place])   . Cognitive deficits   . Depression   . Diabetes mellitus without complication ([place])   .  Hypertension   . Mental disorder   . Obesity     Past Surgical History:  Procedure Laterality Date  . CESAREAN SECTION    . CESAREAN SECTION N/A 04/25/2013   Procedure: REPEAT CESAREAN SECTION;  Surgeon: [name], MD;  Location: [facility];  Service: Obstetrics;  Laterality: N/A;  . MASS EXCISION N/A 06/03/2012   Procedure: EXCISION MASS;  Surgeon: [name], MD;  Location: [facility];  Service: ENT;  Laterality: N/A;  Excision uvula mass  . TONSILLECTOMY N/A 06/03/2012   Procedure: TONSILLECTOMY;  Surgeon: [name], MD;  Location: [facility];  Service: ENT;  Laterality: N/A;  . TONSILLECTOMY     Family History:  Family History  Problem Relation Age of Onset  . Hypertension Mother   . Diabetes Father    Family Psychiatric  History: See H&P Social History:  Social History   Substance and Sexual Activity  Alcohol Use No     Social History   Substance and Sexual Activity  Drug Use No   Comment: Patient denies    Social History   Socioeconomic History  . Marital status: Single    Spouse name: Not on file  . Number of children: Not on file  . Years of education: Not on file  . Highest education level: Not on file  Occupational History  . Not on file  Social Needs  . Financial resource strain: Not on file  . Food insecurity:    Worry: Not on file    Inability: Not on file  . Transportation needs:    Medical: Not on file    Non-medical: Not on file  Tobacco Use  . Smoking status: Current Every Day Smoker    Packs/day: 3.00    Years: 11.00    Pack years: 33.00    Types: Cigarettes  . Smokeless tobacco: Never Used  Substance and Sexual Activity  . Alcohol use: No  . Drug use: No    Comment: Patient denies  . Sexual activity: Yes    Birth control/protection: Implant  Lifestyle  . Physical activity:    Days per week: Not on file    Minutes per session: Not on file  . Stress: Not on file  Relationships  . Social  connections:    Talks on phone: Not on file    Gets together: Not on file    Attends religious service: Not on file    Active member of club or organization: Not on file    Attends meetings of clubs or organizations: Not on file    Relationship status: Not on file  Other Topics Concern  . Not on file  Social History Narrative  . Not on file   Additional Social History:   Sleep: Fair  Appetite:  Good  Current Medications: Current Facility-Administered Medications  Medication Dose Route Frequency Provider Last Rate Last Dose  . acetaminophen (TYLENOL) tablet 650 mg  650 mg Oral Q6H PRN Nanci Pina, FNP      . alum & mag hydroxide-simeth (MAALOX/MYLANTA) 200-200-20 MG/5ML suspension 30 mL  30 mL Oral Q4H PRN Nanci Pina, FNP      . ARIPiprazole (ABILIFY) tablet  20 mg  20 mg Oral Daily Lindell Spar I, NP   20 mg at 12/05/17 0726  . ARIPiprazole ER (ABILIFY MAINTENA) injection 400 mg  400 mg Intramuscular Q28 days Lindell Spar I, NP   400 mg at 12/03/17 1210  . benztropine (COGENTIN) tablet 1 mg  1 mg Oral BID PRN Nanci Pina, FNP   1 mg at 12/05/17 1254  . busPIRone (BUSPAR) tablet 15 mg  15 mg Oral TID Nanci Pina, FNP   15 mg at 12/05/17 1205  . fluvoxaMINE (LUVOX) tablet 100 mg  100 mg Oral TID Nanci Pina, FNP   100 mg at 12/05/17 1206  . glipiZIDE (GLUCOTROL XL) 24 hr tablet 10 mg  10 mg Oral BID Laverle Hobby, PA-C   10 mg at 12/05/17 2956  . hydrochlorothiazide (HYDRODIURIL) tablet 25 mg  25 mg Oral Daily Nanci Pina, FNP   25 mg at 12/05/17 2130  . hydrOXYzine (ATARAX/VISTARIL) tablet 25 mg  25 mg Oral TID PRN Nanci Pina, FNP   25 mg at 12/04/17 2050  . insulin aspart (novoLOG) injection 0-15 Units  0-15 Units Subcutaneous TID WC [name] E, PA-C   3 Units at 12/05/17 1204  . insulin aspart (novoLOG) injection 0-5 Units  0-5 Units Subcutaneous QHS Laverle Hobby, PA-C   3 Units at 12/04/17 2049  . insulin detemir (LEVEMIR) injection  40 Units  40 Units Subcutaneous BID PC Laverle Hobby, PA-C   40 Units at 12/05/17 0725  . magnesium hydroxide (MILK OF MAGNESIA) suspension 30 mL  30 mL Oral Daily PRN Lavina Hamman, Takia S, FNP      . nicotine polacrilex (NICORETTE) gum 2 mg  2 mg Oral Q1H PRN Pennelope Bracken, MD   2 mg at 12/05/17 1323  . OLANZapine zydis (ZYPREXA) disintegrating tablet 5 mg  5 mg Oral Q8H PRN Pennelope Bracken, MD   5 mg at 12/05/17 8657   Or  . ziprasidone (GEODON) injection 20 mg  20 mg Intramuscular Q12H PRN Pennelope Bracken, MD      . ondansetron (ZOFRAN) tablet 4 mg  4 mg Oral Q8H PRN Nanci Pina, FNP      . Oxcarbazepine (TRILEPTAL) tablet 300 mg  300 mg Oral BID Pennelope Bracken, MD   300 mg at 12/05/17 0726  . pneumococcal 23 valent vaccine (PNU-IMMUNE) injection 0.5 mL  0.5 mL Intramuscular Tomorrow-1000 [name] T, MD      . traZODone (DESYREL) tablet 100 mg  100 mg Oral QHS PRN Nanci Pina, FNP   100 mg at 12/04/17 2050   Lab Results:  Results for orders placed or performed during the hospital encounter of 11/30/17 (from the past 48 hour(s))  Glucose, capillary     Status: Abnormal   Collection Time: 12/03/17  5:10 PM  Result Value Ref Range   Glucose-Capillary 274 (H) 70 - 99 mg/dL   Comment 1 Notify RN    Comment 2 Document in Chart   Glucose, capillary     Status: Abnormal   Collection Time: 12/03/17  8:39 PM  Result Value Ref Range   Glucose-Capillary 336 (H) 70 - 99 mg/dL  Glucose, capillary     Status: Abnormal   Collection Time: 12/04/17  6:26 AM  Result Value Ref Range   Glucose-Capillary 217 (H) 70 - 99 mg/dL  Glucose, capillary     Status: Abnormal   Collection Time: 12/04/17 11:56 AM  Result  Value Ref Range   Glucose-Capillary 248 (H) 70 - 99 mg/dL  Glucose, capillary     Status: Abnormal   Collection Time: 12/04/17  4:44 PM  Result Value Ref Range   Glucose-Capillary 270 (H) 70 - 99 mg/dL  Comprehensive metabolic panel      Status: Abnormal   Collection Time: 12/04/17  7:24 PM  Result Value Ref Range   Sodium 136 135 - 145 mmol/L    Comment: POST-ULTRACENTRIFUGATION LIPEMIC SPECIMEN    Potassium 3.5 3.5 - 5.1 mmol/L   Chloride 98 98 - 111 mmol/L   CO2 25 22 - 32 mmol/L   Glucose, Bld 299 (H) 70 - 99 mg/dL   BUN 10 6 - 20 mg/dL   Creatinine, Ser 1.18 (H) 0.44 - 1.00 mg/dL   Calcium 9.2 8.9 - 10.3 mg/dL   Total Protein 6.7 6.5 - 8.1 g/dL   Albumin 3.8 3.5 - 5.0 g/dL   AST 30 15 - 41 U/L   ALT 25 0 - 44 U/L   Alkaline Phosphatase 49 38 - 126 U/L   Total Bilirubin 0.6 0.3 - 1.2 mg/dL   GFR calc non Af Amer >60 >60 mL/min   GFR calc Af Amer >60 >60 mL/min    Comment: (NOTE) The eGFR has been calculated using the CKD EPI equation. This calculation has not been validated in all clinical situations. eGFR's persistently <60 mL/min signify possible Chronic Kidney Disease.    Anion gap 13 5 - 15    Comment: Performed at [facility] [address], [place], [place] [address]  Glucose, capillary     Status: Abnormal   Collection Time: 12/04/17  7:32 PM  Result Value Ref Range   Glucose-Capillary 300 (H) 70 - 99 mg/dL  Glucose, capillary     Status: Abnormal   Collection Time: 12/05/17  5:52 AM  Result Value Ref Range   Glucose-Capillary 230 (H) 70 - 99 mg/dL  Glucose, capillary     Status: Abnormal   Collection Time: 12/05/17 12:00 PM  Result Value Ref Range   Glucose-Capillary 152 (H) 70 - 99 mg/dL   Blood Alcohol level:  Lab Results  Component Value Date   ETH <10 11/29/2017   ETH <10 69/48/5462   Metabolic Disorder Labs: Lab Results  Component Value Date   HGBA1C 9.1 (H) 12/01/2017   MPG 214.47 12/01/2017   MPG 177 07/18/2015   Lab Results  Component Value Date   PROLACTIN 36.7 (H) 12/01/2017   PROLACTIN 37.3 (H) 07/18/2015   Lab Results  Component Value Date   CHOL 253 (H) 12/01/2017   TRIG 319 (H) 12/01/2017   HDL 42 12/01/2017   CHOLHDL 6.0 12/01/2017   VLDL 64 (H)  12/01/2017   LDLCALC 147 (H) 12/01/2017   LDLCALC UNABLE TO CALCULATE IF TRIGLYCERIDE OVER 400 mg/dL 07/18/2015   Physical Findings: AIMS: Facial and Oral Movements Muscles of Facial Expression: None, normal Lips and Perioral Area: None, normal Jaw: None, normal Tongue: None, normal,Extremity Movements Upper (arms, wrists, hands, fingers): None, normal Lower (legs, knees, ankles, toes): None, normal, Trunk Movements Neck, shoulders, hips: None, normal, Overall Severity Severity of abnormal movements (highest score from questions above): None, normal Incapacitation due to abnormal movements: None, normal Patient's awareness of abnormal movements (rate only patient's report): No Awareness, Dental Status Current problems with teeth and/or dentures?: No Does patient usually wear dentures?: No  CIWA:  CIWA-Ar Total: 0 COWS:  COWS Total Score: 2  Musculoskeletal: Strength &  Muscle Tone: within normal limits Gait & Station: normal Patient leans: N/A  Psychiatric Specialty Exam: Physical Exam  Nursing note and vitals reviewed.   Review of Systems  Psychiatric/Behavioral: Positive for depression ("Improving") and hallucinations (Hx. psychosis). Negative for memory loss, substance abuse and suicidal ideas. The patient is not nervous/anxious and does not have insomnia.     Blood pressure 131/75, pulse (!) 117, temperature 98.4 F (36.9 C), temperature source Oral, resp. rate 18, height '5\' 7"'  (1.702 m), weight 108 kg, SpO2 100 %.Body mass index is 37.29 kg/m.  General Appearance: Casual and Fairly Groomed  Eye Contact:  Good  Speech:  Clear and Coherent and Normal Rate  Volume:  Normal  Mood: Euphoric  Affect:  Congruent  Thought Process:  Coherent and Goal Directed  Orientation:  Full (Time, Place, and Person)  Thought Content:  Logical  Suicidal Thoughts:  No  Homicidal Thoughts:  No  Memory:  Immediate;   Fair Recent;   Fair Remote;   Fair  Judgement:  Poor  Insight:   Lacking  Psychomotor Activity:  Normal  Concentration:  Concentration: Fair  Recall:  [employer] of Knowledge:  Fair  Language:  Fair  Akathisia:  No  Handed:    AIMS (if indicated):     Assets:  Resilience Social Support  ADL's:  Intact  Cognition:  WNL  Sleep:  Number of Hours: 6.75     Treatment Plan Summary: Daily contact with patient to assess and evaluate symptoms and progress in treatment.  - Continue inpatient admission.  - Will continue today 12/05/2017 plan as below except where it is noted.  Mood control.     - Continue Abilify from 15 mg po daily to Abilify 20 mg po Q daily.     - Continue Abilify Maintenna 400 mg IM Q 28 days.  Mood stabilization.     - Initiated Trileptal 300 mg po bid.  Anxiety.     - Buspar 15 mg po tid.     - Continue Vistaril 25 mg po tid prn.  Depression/OCD.     - Continue Luvox 100 mg po daily.  Agitation/psychosis.     - Continue Olanzapine zydis 5 mg po Q 8 hours prn or     - Geodon 20 mg IM Q 12 hours prn.  Nicotine withdrawal.     - Continue the Nicotine gum 2 mg prn.  Insomnia.     - Continue Trazodone from 100 mg po Q hs to Trazodone 150 mg po Q hs prn.  Other medical issues. DM: Continue the sliding scale insulin coverage per protocol.          - Continue Glucotrol XL 10 mg po daily. HTN: Continue HCTZ 25 mg po daily.    Patient is being encouraged to attend & participate in the group sessions.  Discharge disposition on going.  Nanci Pina, FNP, FNP-BC. 12/05/2017, 1:33 PMPatient ID: [name], female   DOB: [birth year], 27 y.o.   MRN: [id number] .Marland KitchenMarland KitchenAgree with NP Progress Note

## 2017-12-05 NOTE — Plan of Care (Signed)
D: Patient presents manic, elevated, restless. She is singing repetitive song lyrics at the nurse's station throughout the day. She frequently states "I think I am getting better" but then states "Do you think my medicine is making me worse?" She exhibits compulsive behavior, but denies having any compulsive behavior. She reports sleeping well last night, and received medication that was helpful. Her appetite is good, energy hyper. She rates her depression, hopelessness and anxiety 0/10. She denies physical complaints. Patient denies SI/HI/AVH.  A: Patient checked q15 min, and checks reviewed. Reviewed medication changes with patient and educated on side effects. Educated patient on importance of attending group therapy sessions and educated on several coping skills. Encouarged participation in milieu through recreation therapy and attending meals with peers. Support and encouragement provided. Fluids offered. R: Patient receptive to education on medications, and is medication compliant. Patient contracts for safety on the unit. Her goal is to work on "maybe meds."

## 2017-12-05 NOTE — [facility] Group Notes (Signed)
[facility] Group Notes:  (Nursing/MHT/Case Management/Adjunct)  Date:  12/05/2017  Time:  5:07 PM  Type of Therapy:  Nurse Education  Participation Level:  Minimal  Participation Quality:  Inattentive and Resistant  Affect:  Irritable  Cognitive:  Lacking  Insight:  Limited  Engagement in Group:  Distracting, Limited and Off Topic  Modes of Intervention:  Discussion and Education  Summary of Progress/Problems: In this group, we discussed communication "Do's and Don'ts." We discussed appropriate body language, tone of voice, and word choices. Also, we had the patients take a love language test, and discussed the results. The patient had minimal participation in the group therapy session. She was disruptive and left and came into the group multiple times. She reported "I need to go to a group home, I am getting agitated."   Lesli Albee 12/05/2017, 5:07 PM

## 2017-12-05 NOTE — [facility] Group Notes (Signed)
[facility] LCSW Group Therapy Note  Date/Time:  12/05/2017  11:00AM-12:00PM  Type of Therapy and Topic:  Group Therapy:  Music and Mood  Participation Level:  Minimal   Description of Group: In this process group, members listened to a variety of genres of music and identified that different types of music evoke different responses.  Patients were encouraged to identify music that was soothing for them and music that was energizing for them.  Patients discussed how this knowledge can help with wellness and recovery in various ways including managing depression and anxiety as well as encouraging healthy sleep habits.    Therapeutic Goals: 1. Patients will explore the impact of different varieties of music on mood 2. Patients will verbalize the thoughts they have when listening to different types of music 3. Patients will identify music that is soothing to them as well as music that is energizing to them 4. Patients will discuss how to use this knowledge to assist in maintaining wellness and recovery 5. Patients will explore the use of music as a coping skill  Summary of Patient Progress:  At the beginning of group, patient expressed that she felt "1000% better."  She was in and out of the room numerous times and did not really pay attention.  Therapeutic Modalities: Solution Focused Brief Therapy Activity   Selmer Dominion, LCSW

## 2017-12-05 NOTE — [facility] Group Notes (Signed)
[facility] Group Notes:  (Nursing/MHT/Case Management/Adjunct)  Date:  12/05/2017  Time:  9:42 AM  Type of Therapy:  Nurse Education  Participation Level:  Active  Participation Quality:  Inattentive  Affect:  Excited  Cognitive:  Lacking  Insight:  Lacking  Engagement in Group:  Distracting  Modes of Intervention:  Activity  Summary of Progress/Problems: This group consisted of learning about healthy support systems. We discussed about different community support resources, including the ACTT team, a therapist, a friend, a family member, etc. We also covered healthy communication skills. The patient set a goal of "to stop being so hyper." She was hyperverbal, distracting and left during the group and returned towards the end.  Lesli Albee 12/05/2017, 9:42 AM

## 2017-12-06 LAB — GLUCOSE, CAPILLARY
GLUCOSE-CAPILLARY: 210 mg/dL — AB (ref 70–99)
Glucose-Capillary: 165 mg/dL — ABNORMAL HIGH (ref 70–99)

## 2017-12-06 MED ORDER — BENZTROPINE MESYLATE 1 MG PO TABS: 1.0000 mg | ORAL_TABLET | Freq: Two times a day (BID) | ORAL | 0 refills | Status: DC | PRN

## 2017-12-06 MED ORDER — NICOTINE POLACRILEX 2 MG MT GUM: 2.0000 mg | CHEWING_GUM | OROMUCOSAL | 0 refills | Status: DC | PRN

## 2017-12-06 MED ORDER — BUSPIRONE HCL 15 MG PO TABS: 15.0000 mg | ORAL_TABLET | Freq: Three times a day (TID) | ORAL | 0 refills | Status: DC

## 2017-12-06 MED ORDER — ARIPIPRAZOLE 20 MG PO TABS: 20.0000 mg | ORAL_TABLET | Freq: Every day | ORAL | 0 refills | Status: DC

## 2017-12-06 MED ORDER — OXCARBAZEPINE 300 MG PO TABS: 300.0000 mg | ORAL_TABLET | Freq: Two times a day (BID) | ORAL | 0 refills | Status: DC

## 2017-12-06 NOTE — Discharge Summary (Addendum)
Physician Discharge Summary Note  Patient:  [name] is an 27 y.o., female MRN:  [id number] DOB:  May 22, 1990 Patient phone:  [phone] (home)  Patient address:   [place] [address],  Total Time spent with patient: 20 minutes  Date of Admission:  11/30/2017 Date of Discharge: 12/06/2017  Reason for Admission:  Schizophrenia  Principal Problem: OCD (obsessive compulsive disorder) Discharge Diagnoses: Patient Active Problem List   Diagnosis Date Noted  . 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 Moderate bipolar I disorder, most recent episode depressed ([place]) [F31.32]   . Psychosis ([place]) [F29]   . Adjustment disorder with mixed disturbance of emotions and conduct [F43.25] 08/03/2017  . LGSIL on Pap smear of cervix [R87.612] 02/01/2017  . OCD (obsessive compulsive disorder) [F42.9] 10/05/2016  . Major depressive disorder, recurrent episode, mild ([place]) [F33.0] 05/04/2016  . 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 Learning disability [F81.9] 07/18/2015  . 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 Diabetes mellitus ([place]) [E11.9] 07/18/2015  . MDD (major depressive disorder), recurrent, severe, with psychosis ([place]) [F33.3] 07/18/2015  . Hyperlipidemia [E78.5] 07/18/2015  . Severe episode of recurrent major depressive disorder, without psychotic features ([place]) [F33.2]   . Drug overdose [T50.901A]   . Cognitive deficits [R41.89] 10/12/2012  . Generalized anxiety disorder [F41.1] 06/28/2012    Past Psychiatric History: Per HPI  Past Medical History:  Past Medical History:  Diagnosis Date  . Anxiety   . Bipolar 1 disorder ([place])   . Cognitive deficits   . Depression   . Diabetes mellitus without complication ([place])   . Hypertension   . Mental disorder   . Obesity     Past Surgical History:  Procedure Laterality Date  . CESAREAN SECTION    . CESAREAN SECTION N/A 04/25/2013   Procedure: REPEAT CESAREAN  SECTION;  Surgeon: [name], MD;  Location: [facility];  Service: Obstetrics;  Laterality: N/A;  . MASS EXCISION N/A 06/03/2012   Procedure: EXCISION MASS;  Surgeon: [name], MD;  Location: [facility];  Service: ENT;  Laterality: N/A;  Excision uvula mass  . TONSILLECTOMY N/A 06/03/2012   Procedure: TONSILLECTOMY;  Surgeon: [name], MD;  Location: [facility];  Service: ENT;  Laterality: N/A;  . TONSILLECTOMY     Family History:  Family History  Problem Relation Age of Onset  . Hypertension Mother   . Diabetes Father    Family Psychiatric  History:  Social History:  Social History   Substance and Sexual Activity  Alcohol Use No     Social History   Substance and Sexual Activity  Drug Use No   Comment: Patient denies    Social History   Socioeconomic History  . Marital status: Single    Spouse name: Not on file  . Number of children: Not on file  . Years of education: Not on file  . Highest education level: Not on file  Occupational History  . Not on file  Social Needs  . Financial resource strain: Not on file  . Food insecurity:    Worry: Not on file    Inability: Not on file  . Transportation needs:    Medical: Not on file    Non-medical: Not on file  Tobacco Use  . Smoking status: Current Every Day Smoker    Packs/day: 3.00    Years: 11.00    Pack years: 33.00    Types: Cigarettes  . Smokeless tobacco: Never Used  Substance and Sexual Activity  . Alcohol use: No  . Drug use: No    Comment: Patient denies  . Sexual activity: Yes    Birth control/protection: Implant  Lifestyle  . Physical activity:    Days per week: Not on file    Minutes per session: Not on file  . Stress: Not on file  Relationships  . Social connections:    Talks on phone: Not on file    Gets together: Not on file    Attends religious service: Not on file    Active member of club or organization: Not on file    Attends meetings of clubs  or organizations: Not on file    Relationship status: Not on file  Other Topics Concern  . Not on file  Social History Narrative  . Not on file    Hospital Course:   [name] was admitted for OCD (obsessive compulsive disorder) , with psychosis and crisis management.  Pt was treated discharged with the medications listed below under Medication List.  Medical problems were identified and treated as needed.  Home medications were restarted as appropriate.  Improvement was monitored by observation and [name] 's daily report of symptom reduction.  Emotional and mental status was monitored by daily self-inventory reports completed by [name] and clinical staff.         [name] was evaluated by the treatment team for stability and plans for continued recovery upon discharge. [name] 's motivation was an integral factor for scheduling further treatment. Employment, transportation, bed availability, health status, family support, and any pending legal issues were also considered during hospital stay. Pt was offered further treatment options upon discharge including but not limited to Residential, Intensive Outpatient, and Outpatient treatment.  [name] will follow up with the services as listed below under Follow Up Information.     Upon completion of this admission the patient was both mentally and medically stable for discharge denying suicidal/homicidal ideation, auditory/visual/tactile hallucinations, delusional thoughts and paranoia.    Family session went well. No seclusion or restraint.  [name] responded well to treatment with Abilify 20 mg,Cogentin 1 mg ,Buspar 15 mg, Trileptal 300 mg , and home medications, without adverse effects. Initially, pt did complain of side effects with these medications, but this resolved. Pt demonstrated improvement without reported or observed adverse effects to the point of stability appropriate for outpatient  management.   Physical Findings: AIMS: Facial and Oral Movements Muscles of Facial Expression: None, normal Lips and Perioral Area: None, normal Jaw: None, normal Tongue: None, normal,Extremity Movements Upper (arms, wrists, hands, fingers): None, normal Lower (legs, knees, ankles, toes): None, normal, Trunk Movements Neck, shoulders, hips: None, normal, Overall Severity Severity of abnormal movements (highest score from questions above): None, normal Incapacitation due to abnormal movements: None, normal Patient's awareness of abnormal movements (rate only patient's report): No Awareness, Dental Status Current problems with teeth and/or dentures?: No Does patient usually wear dentures?: No  CIWA:  CIWA-Ar Total: 0 COWS:  COWS Total Score: 2  Musculoskeletal: Strength & Muscle Tone: within normal limits Gait & Station: normal Patient leans: N/A  Psychiatric Specialty Exam: See SRA Physical Exam  ROS     Have you used any form of tobacco in the last 30 days? (Cigarettes, Smokeless Tobacco, Cigars, and/or Pipes): Yes  Has this patient used any form of tobacco in the last 30 days? (Cigarettes, Smokeless Tobacco, Cigars, and/or Pipes) Yes,  Yes, A prescription for an FDA-approved tobacco cessation medication was offered at discharge and the patient refused  Blood Alcohol level:  Lab Results  Component Value Date   [facility] <10 11/29/2017   ETH <10 46/50/3546    Metabolic Disorder Labs:  Lab Results  Component Value Date   HGBA1C 9.1 (H) 12/01/2017   MPG 214.47 12/01/2017   MPG 177 07/18/2015   Lab Results  Component Value Date   PROLACTIN 36.7 (H) 12/01/2017   PROLACTIN 37.3 (H) 07/18/2015   Lab Results  Component Value Date   CHOL 253 (H) 12/01/2017   TRIG 319 (H) 12/01/2017   HDL 42 12/01/2017   CHOLHDL 6.0 12/01/2017   VLDL 64 (H) 12/01/2017   LDLCALC 147 (H) 12/01/2017   LDLCALC UNABLE TO CALCULATE IF TRIGLYCERIDE OVER 400 mg/dL 07/18/2015    See Psychiatric  Specialty Exam and Suicide Risk Assessment completed by Attending Physician prior to discharge.  Discharge destination:  Home  Is patient on multiple antipsychotic therapies at discharge:  No   Has Patient had three or more failed trials of antipsychotic monotherapy by history:  No  Recommended Plan for Multiple Antipsychotic Therapies: NA  Discharge Instructions    Diet - low sodium heart healthy   Complete by:  As directed    Increase activity slowly   Complete by:  As directed      Allergies as of 12/06/2017      Reactions   Wellbutrin [bupropion] Shortness Of Breath   Omnipaque [iohexol] Swelling, Other (See Comments)   Reaction:  Eye swelling   Penicillins Hives, Other (See Comments)   Has patient had a PCN reaction causing immediate rash, facial/tongue/throat swelling, SOB or lightheadedness with hypotension: No Has patient had a PCN reaction causing severe rash involving mucus membranes or skin necrosis: No Has patient had a PCN reaction that required hospitalization No Has patient had a PCN reaction occurring within the last 10 years: No If all of the above answers are "NO", then may proceed with Cephalosporin use.   Depakote Er [divalproex Sodium Er] Nausea And Vomiting      Medication List    STOP taking these medications   fluvoxaMINE 100 MG tablet Commonly known as:  LUVOX   fluvoxaMINE 50 MG tablet Commonly known as:  LUVOX   GLIPIZIDE XL 10 MG 24 hr tablet Generic drug:  glipiZIDE   hydrochlorothiazide 25 MG tablet Commonly known as:  HYDRODIURIL   INVEGA SUSTENNA 234 MG/1.5ML Susy injection Generic drug:  paliperidone   LEVEMIR FLEXTOUCH 100 UNIT/ML Pen Generic drug:  Insulin Detemir   NEXPLANON 68 MG Impl implant Generic drug:  etonogestrel   paliperidone 3 MG 24 hr tablet Commonly known as:  INVEGA     TAKE these medications     Indication  ARIPiprazole 20 MG tablet Commonly known as:  ABILIFY Take 1 tablet (20 mg total) by mouth  daily. Start taking on:  12/07/2017 What changed:    medication strength  how much to take  Indication:  Schizophrenia   benztropine 1 MG tablet Commonly known as:  COGENTIN Take 1 tablet (1 mg total) by mouth 2 (two) times daily as needed for tremors (EPS).  Indication:  Extrapyramidal Reaction caused by Medications   busPIRone 15 MG tablet Commonly known as:  BUSPAR Take 1 tablet (15 mg total) by mouth 3 (three) times daily. What changed:    when to take this  additional instructions  Indication:  Anxiety Disorder   nicotine polacrilex 2  MG gum Commonly known as:  NICORETTE Take 1 each (2 mg total) by mouth every hour as needed for smoking cessation. What changed:    when to take this  reasons to take this  Indication:  Nicotine Addiction   Oxcarbazepine 300 MG tablet Commonly known as:  TRILEPTAL Take 1 tablet (300 mg total) by mouth 2 (two) times daily.  Indication:  Diabetes with Nerve Disease, Focal Epilepsy      Follow-up [facility] Follow up.   Why:  Someone from the ACT team will pick you up the day of d/c. Contact information: [place] [address] [phone]           Follow-up recommendations:  Activity:  as tolerated Diet:  Heart healthy  Comments:  Take all medications as prescribed. Keep all follow-up appointments as scheduled.  Do not consume alcohol or use illegal drugs while on prescription medications. Report any adverse effects from your medications to your primary care provider promptly.  In the event of recurrent symptoms or worsening symptoms, call 911, a crisis hotline, or go to the nearest emergency department for evaluation.   Signed: [name], NP 12/06/2017, 10:21 AM   Patient seen, Suicide Assessment Completed.  Disposition Plan Reviewed

## 2017-12-06 NOTE — Progress Notes (Signed)
Patient discharged to lobby. Patient was stable and appreciative at that time. All papers and prescriptions were given and valuables returned. Verbal understanding expressed. Denies SI/HI and A/VH. Patient given opportunity to express concerns and ask questions.  

## 2017-12-06 NOTE — [facility] Suicide Risk Assessment (Signed)
[facility] Discharge Suicide Risk Assessment   Principal Problem: OCD (obsessive compulsive disorder) Discharge Diagnoses:  Patient Active Problem List   Diagnosis Date Noted  . 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 Moderate bipolar I disorder, most recent episode depressed ([place]) [F31.32]   . Psychosis ([place]) [F29]   . Adjustment disorder with mixed disturbance of emotions and conduct [F43.25] 08/03/2017  . LGSIL on Pap smear of cervix [R87.612] 02/01/2017  . OCD (obsessive compulsive disorder) [F42.9] 10/05/2016  . Major depressive disorder, recurrent episode, mild ([place]) [F33.0] 05/04/2016  . 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 Learning disability [F81.9] 07/18/2015  . 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 Diabetes mellitus ([place]) [E11.9] 07/18/2015  . MDD (major depressive disorder), recurrent, severe, with psychosis ([name]) [F33.3] 07/18/2015  . Hyperlipidemia [E78.5] 07/18/2015  . Severe episode of recurrent major depressive disorder, without psychotic features ([place]) [F33.2]   . Drug overdose [T50.901A]   . Cognitive deficits [R41.89] 10/12/2012  . Generalized anxiety disorder [F41.1] 06/28/2012    Total Time spent with patient: 30 minutes  Musculoskeletal: Strength & Muscle Tone: within normal limits Gait & Station: normal Patient leans: N/A  Psychiatric Specialty Exam: Review of Systems  Constitutional: Negative for chills and fever.  Respiratory: Negative for cough and shortness of breath.   Cardiovascular: Negative for chest pain.  Gastrointestinal: Negative for abdominal pain, heartburn, nausea and vomiting.  Psychiatric/Behavioral: Negative for depression, hallucinations and suicidal ideas. The patient is not nervous/anxious and does not have insomnia.     Blood pressure (!) 141/82, pulse (!) 102, temperature 98.1 F (36.7 C), resp. rate 20, height 5\' 7"  (1.702 m), weight 108 kg, SpO2 100 %.Body mass index is 37.29 kg/m.   General Appearance: Casual  Eye Contact::  Good  Speech:  Clear and Coherent and Normal Rate  Volume:  Normal  Mood:  Euthymic  Affect:  Appropriate, Blunt and Congruent  Thought Process:  Coherent and Goal Directed  Orientation:  Full (Time, Place, and Person)  Thought Content:  Logical  Suicidal Thoughts:  No  Homicidal Thoughts:  No  Memory:  Immediate;   Fair Recent;   Fair Remote;   Fair  Judgement:  Fair  Insight:  Fair  Psychomotor Activity:  Normal  Concentration:  Fair  Recall:  [employer] of Knowledge:Fair  Language: Fair  Akathisia:  No  Handed:    AIMS (if indicated):     Assets:  Resilience Social Support  Sleep:  Number of Hours: 6.75  Cognition: WNL  ADL's:  Intact   Mental Status Per Nursing Assessment::   On Admission:  Suicidal ideation indicated by patient  Demographic Factors:  Adolescent or young adult, Low socioeconomic status, Living alone and Unemployed  Loss Factors: Financial problems/change in socioeconomic status  Historical Factors: Impulsivity  Risk Reduction Factors:   Positive social support, Positive therapeutic relationship and Positive coping skills or problem solving skills  Continued Clinical Symptoms:  Obsessive-Compulsive Disorder Schizophrenia:   Paranoid or undifferentiated type  Cognitive Features That Contribute To Risk:  None    Suicide Risk:  Minimal: No identifiable suicidal ideation.  Patients presenting with no risk factors but with morbid ruminations; may be classified as minimal risk based on the severity of the depressive symptoms  Follow-up [facility] Follow up.   Why:  Someone from the ACT team will pick you up the day of d/c. Contact information: [place] [address] [phone]  Subjective Data:  [name] is a 27 y/o F with history of schizophrenia, bipolar disorder, OCD, GAD, MDD, and borderline intellectual functioning who was  admitted voluntarily from [name] where she presented with 2-day history of worsening intrusive thoughts and compulsion to cut her hair. Pt had called the police and reported SI associated with stressor of finding out her boyfriend has been in contact with his ex-wife. Pt was medically cleared and then transferred to [facility] for additional treatment and stabilization. She was restarted on home medications of Invega, luvox, and abilify. Abilify dose was titrated up and pt was started on trial of long-acting injectable abilify Maintena. She was also started on trial of trileptal. Pt had gradual improvement of her presenting symptoms.  Today upon evaluation, pt shares, "I'm doing good- do I get to go home today?" Pt denies any specific concerns. She reports resolution of intrusive thoughts to shave her head. She is sleeping well. Her appetite is good. She denies other physical complaints. She denies SI/HI/AH/VH. She is tolerating her medications well, and she is in agreement to continue her current regimen without changes. She plans to continue follow up with her ACT team. She was able to engage in safety planning including plan to return to [facility] or contact emergency services if she feels unable to maintain her own safety or the safety of others. Pt had no further questions, comments, or concerns.   Plan Of Care/Follow-up recommendations:   -Discharge to outpatient level of care  -OCD and schizophrenia     - Continue Abilify 20 mg po Q daily.     - Continue Abilify Maintenna 400 mg IM Q 28 days (given 12/03/17)    - Continue Trileptal 300 mg po bid.     - Continue Luvox 100 mg po daily.  -Anxiety.     - Continue Buspar 15 mg po tid.     - Continue Vistaril 25 mg po tid prn anxiety.  Insomnia.     - Continue Trazodone 150 mg po qhs prn insomnia.  Other medical issues. DM: Continue Glucotrol XL 10 mg po daily. HTN: Continue HCTZ 25 mg po daily.    Activity:  as tolerated Diet:  normal Tests:   NA Other:  see above for [place], MD 12/06/2017, 9:58 AM

## 2017-12-06 NOTE — Plan of Care (Signed)
  Problem: Medication: Goal: Compliance with prescribed medication regimen will improve Outcome: Progressing Patient is med compliant. Requests prns when needed.   Problem: Coping: Goal: Ability to interact with others will improve Outcome: Not Progressing Patient loud, silly, intrusive and off-putting to others.

## 2017-12-06 NOTE — Progress Notes (Signed)
  [facility] Adult Case Management Discharge Plan :  Will you be returning to the same living situation after discharge:  Yes,  home At discharge, do you have transportation home?: Yes,  ACT team Do you have the ability to pay for your medications: Yes,  MCD  Release of information consent forms completed and in the chart;  Patient's signature needed at discharge.  Patient to Follow up at: Follow-up [facility] Follow up.   Why:  Someone from the ACT team will pick you up the day of d/c. Contact information: [place] [address] [phone]           Next level of care provider has access to [place] and Suicide Prevention discussed: Yes,  yes  Have you used any form of tobacco in the last 30 days? (Cigarettes, Smokeless Tobacco, Cigars, and/or Pipes): Yes  Has patient been referred to the Quitline?: Patient refused referral  Patient has been referred for addiction treatment: [facility], LCSW 12/06/2017, 11:20 AM

## 2017-12-06 NOTE — Progress Notes (Signed)
D: Patient observed loud, intrusive and hyperactive on the unit. Singing the same song repeatedly. Standing at the nurses' station and requiring frequent redirection. Pleasant and complies. Patient states, "I'm so hyper. I need something, please, to help me calm down." Patient's affect animated, silly, mood elevated.  Denies pain, physical complaints.   A: Medicated per orders, prn zydis given for complaints. Patient also requested vistaril at bedtime. Medication education provided. Level III obs in place for safety. Emotional support offered. Encouraged to attend and participate in unit programming.   R: Patient verbalizes understanding of POC though would benefit from ongoing re-education. After zydis admin, patient's mood did become less hyper. On reassess of vistaril, patient was asleep. Patient denies SI/HI/AVH and remains safe on level III obs. Will continue to monitor throughout the night.

## 2017-12-11 ENCOUNTER — Emergency Department (HOSPITAL_COMMUNITY)
Admission: EM | Admit: 2017-12-11 | Discharge: 2017-12-11 | Disposition: A | Discharge status: Home / Self Care | Payer: Medicaid Other | Attending: Emergency Medicine | Admitting: Emergency Medicine

## 2017-12-11 ENCOUNTER — Encounter (HOSPITAL_COMMUNITY): Payer: Self-pay | Admitting: *Deleted

## 2017-12-11 DIAGNOSIS — H02843 Edema of right eye, unspecified eyelid: Secondary | ICD-10-CM | POA: Diagnosis not present

## 2017-12-11 DIAGNOSIS — R5383 Other fatigue: Secondary | ICD-10-CM | POA: Insufficient documentation

## 2017-12-11 DIAGNOSIS — H02846 Edema of left eye, unspecified eyelid: Secondary | ICD-10-CM | POA: Diagnosis not present

## 2017-12-11 DIAGNOSIS — Z5321 Procedure and treatment not carried out due to patient leaving prior to being seen by health care provider: Secondary | ICD-10-CM | POA: Diagnosis not present

## 2017-12-11 DIAGNOSIS — R42 Dizziness and giddiness: Secondary | ICD-10-CM | POA: Insufficient documentation

## 2017-12-11 NOTE — ED Triage Notes (Signed)
Pt complains of swelling around eyes, fatigue, dizziness since yesterday.Pt states she vomited this morning. Pt states she recently started taking cogentin.

## 2017-12-11 NOTE — ED Notes (Signed)
Pt name was called 2x. Pt did not respond.

## 2017-12-16 ENCOUNTER — Other Ambulatory Visit: Payer: Self-pay

## 2017-12-16 ENCOUNTER — Emergency Department (HOSPITAL_COMMUNITY)
Admission: EM | Admit: 2017-12-16 | Discharge: 2017-12-16 | Disposition: A | Discharge status: Home / Self Care | Payer: Medicaid Other | Attending: Emergency Medicine | Admitting: Emergency Medicine

## 2017-12-16 ENCOUNTER — Encounter (HOSPITAL_COMMUNITY): Payer: Self-pay | Admitting: Emergency Medicine

## 2017-12-16 DIAGNOSIS — F17210 Nicotine dependence, cigarettes, uncomplicated: Secondary | ICD-10-CM | POA: Insufficient documentation

## 2017-12-16 DIAGNOSIS — R1084 Generalized abdominal pain: Secondary | ICD-10-CM | POA: Diagnosis present

## 2017-12-16 DIAGNOSIS — Z79899 Other long term (current) drug therapy: Secondary | ICD-10-CM | POA: Insufficient documentation

## 2017-12-16 DIAGNOSIS — R109 Unspecified abdominal pain: Secondary | ICD-10-CM

## 2017-12-16 DIAGNOSIS — E119 Type 2 diabetes mellitus without complications: Secondary | ICD-10-CM | POA: Diagnosis not present

## 2017-12-16 LAB — URINALYSIS, ROUTINE W REFLEX MICROSCOPIC
Bacteria, UA: NONE SEEN
Bilirubin Urine: NEGATIVE
Glucose, UA: 50 mg/dL — AB
Hgb urine dipstick: NEGATIVE
Ketones, ur: 5 mg/dL — AB
Leukocytes, UA: NEGATIVE
Nitrite: NEGATIVE
PH: 5 (ref 5.0–8.0)
Protein, ur: 30 mg/dL — AB
SPECIFIC GRAVITY, URINE: 1.033 — AB (ref 1.005–1.030)

## 2017-12-16 LAB — CBC
HCT: 42.6 % (ref 36.0–46.0)
Hemoglobin: 13.9 g/dL (ref 12.0–15.0)
MCH: 26.8 pg (ref 26.0–34.0)
MCHC: 32.6 g/dL (ref 30.0–36.0)
MCV: 82.2 fL (ref 78.0–100.0)
PLATELETS: 308 10*3/uL (ref 150–400)
RBC: 5.18 MIL/uL — ABNORMAL HIGH (ref 3.87–5.11)
RDW: 14.3 % (ref 11.5–15.5)
WBC: 10.7 10*3/uL — AB (ref 4.0–10.5)

## 2017-12-16 LAB — I-STAT BETA HCG BLOOD, ED (MC, WL, AP ONLY): I-stat hCG, quantitative: <5 m[IU]/mL (ref <5)

## 2017-12-16 LAB — COMPREHENSIVE METABOLIC PANEL
ALK PHOS: 48 U/L (ref 38–126)
ALT: 31 U/L (ref 0–44)
AST: 31 U/L (ref 15–41)
Albumin: 4.2 g/dL (ref 3.5–5.0)
Anion gap: 11 (ref 5–15)
BILIRUBIN TOTAL: 0.5 mg/dL (ref 0.3–1.2)
BUN: 10 mg/dL (ref 6–20)
CALCIUM: 10.1 mg/dL (ref 8.9–10.3)
CO2: 26 mmol/L (ref 22–32)
CREATININE: 0.85 mg/dL (ref 0.44–1.00)
Chloride: 105 mmol/L (ref 98–111)
GFR calc Af Amer: >60 mL/min (ref >60)
Glucose, Bld: 198 mg/dL — ABNORMAL HIGH (ref 70–99)
Potassium: 3.8 mmol/L (ref 3.5–5.1)
Sodium: 142 mmol/L (ref 135–145)
TOTAL PROTEIN: 7.7 g/dL (ref 6.5–8.1)

## 2017-12-16 LAB — LIPASE, BLOOD: Lipase: 28 U/L (ref 11–51)

## 2017-12-16 MED ORDER — ONDANSETRON 4 MG PO TBDP
4.0000 mg | ORAL_TABLET | Freq: Once | ORAL | Status: AC
  Administered 2017-12-16: 4 mg via ORAL
  Filled 2017-12-16: qty 1

## 2017-12-16 NOTE — ED Triage Notes (Signed)
Per EMS, patient c/o nausea and abdominal pain x3 days.

## 2017-12-16 NOTE — ED Provider Notes (Signed)
[facility] DEPT Provider Note   CSN: [id number] Arrival date & time: 12/16/17  1916     History   Chief Complaint Chief Complaint  Patient presents with  . Abdominal Pain  . Nausea    HPI [name] is a 27 y.o. female.  The history is provided by the patient.  Abdominal Pain   This is a recurrent problem. The current episode started more than 1 week ago. The problem occurs rarely. The problem has been resolved. Associated with: New medication and feeling more sleepy than usual. Has had some intermittent nausea but currently symptom free. The pain is located in the generalized abdominal region. The quality of the pain is dull. The pain is at a severity of 0/10. The patient is experiencing no pain. Associated symptoms include nausea. Pertinent negatives include anorexia, fever, belching, diarrhea, flatus, hematochezia, melena, vomiting, constipation, dysuria, frequency, hematuria, headaches, arthralgias and myalgias. The symptoms are aggravated by eating. Nothing relieves the symptoms. Past workup does not include GI consult. Her past medical history does not include PUD, gallstones, GERD, ulcerative colitis, Crohn's disease or irritable bowel syndrome.    Past Medical History:  Diagnosis Date  . Anxiety   . Bipolar 1 disorder ([place])   . Cognitive deficits   . Depression   . Diabetes mellitus without complication ([place])   . Hypertension   . Mental disorder   . Obesity     Patient Active Problem List   Diagnosis Date Noted  . 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 Moderate bipolar I disorder, most recent episode depressed ([place])   . Psychosis ([place])   . 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 LGSIL on Pap smear of cervix 02/01/2017  . OCD (obsessive compulsive disorder) 10/05/2016  . Major depressive disorder, recurrent episode, mild ([place]) 05/04/2016  . 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 Learning disability 07/18/2015  . 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 Diabetes mellitus ([place]) 07/18/2015  . 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 Hyperlipidemia 07/18/2015  . Severe episode of recurrent major depressive disorder, without psychotic features ([place])   . Drug overdose   . Cognitive deficits 10/12/2012  . Generalized anxiety disorder 06/28/2012    Past Surgical History:  Procedure Laterality Date  . CESAREAN SECTION    . CESAREAN SECTION N/A 04/25/2013   Procedure: REPEAT CESAREAN SECTION;  Surgeon: [name], MD;  Location: [facility];  Service: Obstetrics;  Laterality: N/A;  . MASS EXCISION N/A 06/03/2012   Procedure: EXCISION MASS;  Surgeon: [name], MD;  Location: [facility];  Service: ENT;  Laterality: N/A;  Excision uvula mass  . TONSILLECTOMY N/A 06/03/2012   Procedure: TONSILLECTOMY;  Surgeon: [name], MD;  Location: [facility];  Service: ENT;  Laterality: N/A;  . TONSILLECTOMY       OB History    Gravida  3   Para  3   Term  3   Preterm  0   AB  0   Living  3     SAB  0   TAB  0   Ectopic  0   Multiple  0   Live Births  3            Home Medications    Prior to Admission medications   Medication Sig Start Date End Date Taking? Authorizing Provider  ARIPiprazole (ABILIFY) 20 MG tablet Take 1 tablet (20  mg total) by mouth daily. 12/07/17  Yes [name], NP  ARIPiprazole ER (ABILIFY MAINTENA) 400 MG SRER injection Inject 400 mg into the muscle every 28 (twenty-eight) days.   Yes [provider]  benztropine (COGENTIN) 1 MG tablet Take 1 tablet (1 mg total) by mouth 2 (two) times daily as needed for tremors (EPS). 12/06/17  Yes [name], NP  glipiZIDE (GLUCOTROL) 10 MG tablet Take 10 mg by mouth daily before breakfast.   Yes [provider]  insulin detemir (LEVEMIR) 100 UNIT/ML injection  Inject 40 Units into the skin 2 (two) times daily.   Yes [provider]  Oxcarbazepine (TRILEPTAL) 300 MG tablet Take 1 tablet (300 mg total) by mouth 2 (two) times daily. 12/06/17  Yes [name], NP  busPIRone (BUSPAR) 15 MG tablet Take 1 tablet (15 mg total) by mouth 3 (three) times daily. Patient not taking: Reported on 12/16/2017 12/06/17   [name], NP  nicotine polacrilex (NICORETTE) 2 MG gum Take 1 each (2 mg total) by mouth every hour as needed for smoking cessation. Patient not taking: Reported on 12/16/2017 12/06/17   [name], NP    Family History Family History  Problem Relation Age of Onset  . Hypertension Mother   . Diabetes Father     Social History Social History   Tobacco Use  . Smoking status: Current Every Day Smoker    Packs/day: 3.00    Years: 11.00    Pack years: 33.00    Types: Cigarettes  . Smokeless tobacco: Never Used  Substance Use Topics  . Alcohol use: No  . Drug use: No    Comment: Patient denies     Allergies   Wellbutrin [bupropion]; Omnipaque [iohexol]; Penicillins; and Depakote er [divalproex sodium er]   Review of Systems Review of Systems  Constitutional: Negative for chills and fever.  HENT: Negative for ear pain and sore throat.   Eyes: Negative for pain and visual disturbance.  Respiratory: Negative for cough and shortness of breath.   Cardiovascular: Negative for chest pain and palpitations.  Gastrointestinal: Positive for abdominal pain and nausea. Negative for anorexia, constipation, diarrhea, flatus, hematochezia, melena and vomiting.  Genitourinary: Negative for dysuria, frequency and hematuria.  Musculoskeletal: Negative for arthralgias, back pain and myalgias.  Skin: Negative for color change and rash.  Neurological: Negative for seizures, syncope and headaches.  All other systems reviewed and are negative.    Physical Exam Updated Vital Signs  ED Triage Vitals [12/16/17  1922]  Enc Vitals Group     BP 117/86     Pulse Rate (!) 117     Resp 15     Temp 98.1 F (36.7 C)     Temp Source Oral     SpO2 99 %     Weight 250 lb (113.4 kg)     Height 5\' 7"  (1.702 m)     Head Circumference      Peak Flow      Pain Score      Pain Loc      Pain Edu?      Excl. in [place]?     Physical Exam  Constitutional: She appears well-developed and well-nourished. No distress.  HENT:  Head: Normocephalic and atraumatic.  Mouth/Throat: No oropharyngeal exudate.  Eyes: Pupils are equal, round, and reactive to light. Conjunctivae and EOM are normal.  Neck: Neck supple.  Cardiovascular: Normal rate, regular rhythm, normal heart sounds and intact distal pulses.  No murmur heard. Pulmonary/Chest: Effort normal and breath sounds normal. No respiratory distress.  Abdominal: Soft. Bowel sounds are normal. There is no tenderness. There is no rigidity, no guarding and no CVA tenderness.  Musculoskeletal: She exhibits no edema.  Neurological: She is alert.  Skin: Skin is warm and dry. Capillary refill takes less than 2 seconds.  Psychiatric: She has a normal mood and affect.  Nursing note and vitals reviewed.    ED Treatments / Results  Labs (all labs ordered are listed, but only abnormal results are displayed) Labs Reviewed  COMPREHENSIVE METABOLIC PANEL - Abnormal; Notable for the following components:      Result Value   Glucose, Bld 198 (*)    All other components within normal limits  CBC - Abnormal; Notable for the following components:   WBC 10.7 (*)    RBC 5.18 (*)    All other components within normal limits  URINALYSIS, ROUTINE W REFLEX MICROSCOPIC - Abnormal; Notable for the following components:   APPearance TURBID (*)    Specific Gravity, Urine 1.033 (*)    Glucose, UA 50 (*)    Ketones, ur 5 (*)    Protein, ur 30 (*)    All other components within normal limits  LIPASE, BLOOD  I-STAT BETA HCG BLOOD, ED ([facility], [facility], [facility] ONLY)     EKG None  Radiology No results found.  Procedures Procedures (including critical care time)  Medications Ordered in ED Medications  ondansetron (ZOFRAN-ODT) disintegrating tablet 4 mg (4 mg Oral Given 12/16/17 2106)     Initial Impression / Assessment and Plan / ED Course  I have reviewed the triage vital signs and the nursing notes.  Pertinent labs & imaging results that were available during my care of the patient were reviewed by me and considered in my medical decision making (see chart for details).     [name] is a 27 year old female history of bipolar, depression, cognitive deficits who presents to the ED with intermittent abdominal pain, fatigue.  Patient with unremarkable vitals.  Initially tachycardic in triage but has resolved upon my evaluation.  Patient no longer with any symptoms.  She states that she feels more sleepy since new changes in her psychiatric medications.  No abdominal pain on exam.  No dysuria.  No nausea, vomiting.  Patient denies any headaches, chest pain, shortness of breath.  Overall is well-appearing.  Ambulates without any issues.  Lab work showed no signs to suggest pancreatitis, kidney injury, anemia, infection.  Urinalysis showed no signs of infection.  Patient was given Zofran and was able to drink and eat without any issues while in the ED.  Suspect patient likely refill in symptoms secondary to medication side effect.  Recommend follow-up with primary care doctor and discharged from ED in good condition.  No concern for intra-abdominal process such as appendicitis given no abdominal pain on exam.  Given return precautions.  This chart was dictated using voice recognition software.  Despite best efforts to proofread,  errors can occur which can change the documentation meaning.   Final Clinical Impressions(s) / ED Diagnoses   Final diagnoses:  Abdominal pain, unspecified abdominal location    ED Discharge Orders    None        Lennice Sites, DO 12/16/17 2314

## 2017-12-17 ENCOUNTER — Encounter (HOSPITAL_COMMUNITY): Payer: Self-pay | Admitting: Emergency Medicine

## 2017-12-17 ENCOUNTER — Emergency Department (HOSPITAL_COMMUNITY)
Admission: EM | Admit: 2017-12-17 | Discharge: 2017-12-17 | Disposition: A | Discharge status: Home / Self Care | Payer: Medicaid Other | Attending: Emergency Medicine | Admitting: Emergency Medicine

## 2017-12-17 DIAGNOSIS — Z79899 Other long term (current) drug therapy: Secondary | ICD-10-CM | POA: Insufficient documentation

## 2017-12-17 DIAGNOSIS — F17210 Nicotine dependence, cigarettes, uncomplicated: Secondary | ICD-10-CM | POA: Insufficient documentation

## 2017-12-17 DIAGNOSIS — Z598 Other problems related to housing and economic circumstances: Secondary | ICD-10-CM | POA: Insufficient documentation

## 2017-12-17 DIAGNOSIS — F201 Disorganized schizophrenia: Secondary | ICD-10-CM | POA: Insufficient documentation

## 2017-12-17 DIAGNOSIS — Z711 Person with feared health complaint in whom no diagnosis is made: Secondary | ICD-10-CM | POA: Insufficient documentation

## 2017-12-17 DIAGNOSIS — Z5989 Other problems related to housing and economic circumstances: Secondary | ICD-10-CM

## 2017-12-17 NOTE — ED Triage Notes (Signed)
Per [employer], states she wants to be out of her body-states she has demons in her-recent changes in her meds

## 2017-12-17 NOTE — Progress Notes (Signed)
Patient just left [facility] for abdominal pain and then said she was hearing demons.  This provider met with her and knows her well, she is upset because her neighbors are loud.  The play loud music and bang on the walls.  She did call her ACT team, [facility], but says she could not get in touch with them.  She wants me to tell them to get her a group home.  I called them and spoke with the staff who reports they can do this as she is the housing coordinator.  She asked this provider to have Aliviah call them.  This provider called the number and gave her the phone.  She is talking to her now.  [name] is at her baseline, upset about her neighbors but does not need to be in the ED.  ACT team agrees.  No suicidal/homicidal ideations or substance abuse, not responding to internal stimuli.  Waylan Boga, PMHNP

## 2017-12-17 NOTE — ED Provider Notes (Signed)
[facility] DEPT Provider Note   CSN: [id number] Arrival date & time: 12/17/17  1613     History   Chief Complaint Chief Complaint  Patient presents with  . Suicidal    HPI [name] is a 27 y.o. female with history of OCD, psychosis is for evaluation of "my medicine is making me act all crazy".  She cannot tell me the new medications she is taking but states that when she got home she started crying and hollering for no reason.  She has been otherwise compliant with all her medications.  She denies thoughts of wanting to kill herself, suicide, wanting to hurt or kill other people, seeing or hearing voices.  No AVH. She offers no other physical complaints. Patient was evaluated by Waylan Boga NP today at 1627 who documented pt does not meet criteria to be in ER or psych tx.  ACT team has been contacted by Waylan Boga and is to pick patient up and facilitate living situation changes. Pt confirms this.   HPI  Past Medical History:  Diagnosis Date  . Anxiety   . Bipolar 1 disorder ([place])   . Cognitive deficits   . Depression   . Diabetes mellitus without complication ([place])   . Hypertension   . Mental disorder   . Obesity     Patient Active Problem List   Diagnosis Date Noted  . 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 Moderate bipolar I disorder, most recent episode depressed ([place])   . Psychosis ([place])   . 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 LGSIL on Pap smear of cervix 02/01/2017  . OCD (obsessive compulsive disorder) 10/05/2016  . Major depressive disorder, recurrent episode, mild ([place]) 05/04/2016  . 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 Learning disability 07/18/2015  . 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 Diabetes mellitus ([place]) 07/18/2015  . 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 Hyperlipidemia 07/18/2015  . Severe  episode of recurrent major depressive disorder, without psychotic features ([place])   . Drug overdose   . Cognitive deficits 10/12/2012  . Generalized anxiety disorder 06/28/2012    Past Surgical History:  Procedure Laterality Date  . CESAREAN SECTION    . CESAREAN SECTION N/A 04/25/2013   Procedure: REPEAT CESAREAN SECTION;  Surgeon: [name], MD;  Location: [facility];  Service: Obstetrics;  Laterality: N/A;  . MASS EXCISION N/A 06/03/2012   Procedure: EXCISION MASS;  Surgeon: [name], MD;  Location: [facility];  Service: ENT;  Laterality: N/A;  Excision uvula mass  . TONSILLECTOMY N/A 06/03/2012   Procedure: TONSILLECTOMY;  Surgeon: [name], MD;  Location: [facility];  Service: ENT;  Laterality: N/A;  . TONSILLECTOMY       OB History    Gravida  3   Para  3   Term  3   Preterm  0   AB  0   Living  3     SAB  0   TAB  0   Ectopic  0   Multiple  0   Live Births  3            Home Medications    Prior to Admission medications   Medication Sig Start Date End Date Taking? Authorizing Provider  ARIPiprazole (ABILIFY) 20 MG tablet Take 1 tablet (20 mg total) by mouth daily. 12/07/17   [name], NP  ARIPiprazole ER (ABILIFY  MAINTENA) 400 MG SRER injection Inject 400 mg into the muscle every 28 (twenty-eight) days.    [provider]  benztropine (COGENTIN) 1 MG tablet Take 1 tablet (1 mg total) by mouth 2 (two) times daily as needed for tremors (EPS). 12/06/17   [name], NP  busPIRone (BUSPAR) 15 MG tablet Take 1 tablet (15 mg total) by mouth 3 (three) times daily. Patient not taking: Reported on 12/16/2017 12/06/17   [name], NP  glipiZIDE (GLUCOTROL) 10 MG tablet Take 10 mg by mouth daily before breakfast.    [provider]  insulin detemir (LEVEMIR) 100 UNIT/ML injection Inject 40 Units into the skin 2 (two) times daily.    [provider]  nicotine  polacrilex (NICORETTE) 2 MG gum Take 1 each (2 mg total) by mouth every hour as needed for smoking cessation. Patient not taking: Reported on 12/16/2017 12/06/17   [name], NP  Oxcarbazepine (TRILEPTAL) 300 MG tablet Take 1 tablet (300 mg total) by mouth 2 (two) times daily. 12/06/17   [name], NP    Family History Family History  Problem Relation Age of Onset  . Hypertension Mother   . Diabetes Father     Social History Social History   Tobacco Use  . Smoking status: Current Every Day Smoker    Packs/day: 3.00    Years: 11.00    Pack years: 33.00    Types: Cigarettes  . Smokeless tobacco: Never Used  Substance Use Topics  . Alcohol use: No  . Drug use: No    Comment: Patient denies     Allergies   Wellbutrin [bupropion]; Omnipaque [iohexol]; Penicillins; and Depakote er [divalproex sodium er]   Review of Systems Review of Systems  Psychiatric/Behavioral: Positive for behavioral problems.  All other systems reviewed and are negative.    Physical Exam Updated Vital Signs BP (!) 141/81 (BP Location: Right Arm)   Pulse (!) 108   Resp 20   SpO2 94%   Physical Exam  Constitutional: She is oriented to person, place, and time. She appears well-developed and well-nourished.  Non-toxic appearance.  HENT:  Head: Normocephalic.  Right Ear: External ear normal.  Left Ear: External ear normal.  Nose: Nose normal.  Eyes: Conjunctivae and EOM are normal.  Neck: Full passive range of motion without pain.  Cardiovascular: Normal rate, regular rhythm and normal heart sounds.  Pulmonary/Chest: Effort normal and breath sounds normal. No tachypnea.  Musculoskeletal: Normal range of motion.  Neurological: She is alert and oriented to person, place, and time.  Skin: Skin is warm and dry. Capillary refill takes less than 2 seconds.  Psychiatric: Her behavior is normal.  No SI, HI, AVH. Good eye contact. Cooperative.      ED Treatments / Results    Labs (all labs ordered are listed, but only abnormal results are displayed) Labs Reviewed - No data to display  EKG None  Radiology No results found.  Procedures Procedures (including critical care time)  Medications Ordered in ED Medications - No data to display   Initial Impression / Assessment and Plan / ED Course  I have reviewed the triage vital signs and the nursing notes.  Pertinent labs & imaging results that were available during my care of the patient were reviewed by me and considered in my medical decision making (see chart for details).     27 year old female presents to the ER for concern of possible medication side effect.  Admits to starting  new medication that is making her cry.  On exam she is cooperative.  Good eye contact.  Denies SI, HI, AVH.  Has been compliant with medication regimen.  She offers no other physical complaints.  Patient was seen prior to ER arrival by psych nurse practitioner who documented patient does not meet criteria for psych treatment or ER.  There is no indication for emergent lab work or imaging.  Patient has history of frequent ER visits.  She presented to the ER yesterday for abdominal pain, I have reviewed this visit and lab work and work-up is reassuring.  Act has been contacted who will pick patient up and facilitate living situation.  I explained plan to discharge and patient is in agreement.  Final Clinical Impressions(s) / ED Diagnoses   Final diagnoses:  Living accommodation issues    ED Discharge Orders    None       Arlean Hopping 12/17/17 1825    Valarie Merino, MD 12/20/17 1504

## 2017-12-26 ENCOUNTER — Encounter (HOSPITAL_COMMUNITY): Payer: Self-pay | Admitting: Emergency Medicine

## 2017-12-26 ENCOUNTER — Emergency Department (HOSPITAL_COMMUNITY)
Admission: EM | Admit: 2017-12-26 | Discharge: 2017-12-27 | Disposition: A | Discharge status: Home / Self Care | Payer: Medicaid Other | Attending: Emergency Medicine | Admitting: Emergency Medicine

## 2017-12-26 ENCOUNTER — Other Ambulatory Visit: Payer: Self-pay

## 2017-12-26 DIAGNOSIS — E119 Type 2 diabetes mellitus without complications: Secondary | ICD-10-CM | POA: Diagnosis not present

## 2017-12-26 DIAGNOSIS — F17210 Nicotine dependence, cigarettes, uncomplicated: Secondary | ICD-10-CM | POA: Diagnosis not present

## 2017-12-26 DIAGNOSIS — R45851 Suicidal ideations: Secondary | ICD-10-CM | POA: Insufficient documentation

## 2017-12-26 DIAGNOSIS — F319 Bipolar disorder, unspecified: Secondary | ICD-10-CM | POA: Insufficient documentation

## 2017-12-26 DIAGNOSIS — I10 Essential (primary) hypertension: Secondary | ICD-10-CM | POA: Insufficient documentation

## 2017-12-26 DIAGNOSIS — F3132 Bipolar disorder, current episode depressed, moderate: Secondary | ICD-10-CM | POA: Diagnosis present

## 2017-12-26 LAB — COMPREHENSIVE METABOLIC PANEL
ALBUMIN: 3.9 g/dL (ref 3.5–5.0)
ALK PHOS: 43 U/L (ref 38–126)
ALT: 20 U/L (ref 0–44)
AST: 22 U/L (ref 15–41)
Anion gap: 7 (ref 5–15)
BUN: 6 mg/dL (ref 6–20)
CALCIUM: 9.4 mg/dL (ref 8.9–10.3)
CHLORIDE: 108 mmol/L (ref 98–111)
CO2: 26 mmol/L (ref 22–32)
CREATININE: 0.73 mg/dL (ref 0.44–1.00)
GFR calc non Af Amer: >60 mL/min (ref >60)
Glucose, Bld: 111 mg/dL — ABNORMAL HIGH (ref 70–99)
Potassium: 3.6 mmol/L (ref 3.5–5.1)
SODIUM: 141 mmol/L (ref 135–145)
Total Bilirubin: 0.6 mg/dL (ref 0.3–1.2)
Total Protein: 6.8 g/dL (ref 6.5–8.1)

## 2017-12-26 LAB — CBC
HEMATOCRIT: 43.9 % (ref 36.0–46.0)
HEMOGLOBIN: 13.7 g/dL (ref 12.0–15.0)
MCH: 26.2 pg (ref 26.0–34.0)
MCHC: 31.2 g/dL (ref 30.0–36.0)
MCV: 83.9 fL (ref 78.0–100.0)
Platelets: 264 10*3/uL (ref 150–400)
RBC: 5.23 MIL/uL — AB (ref 3.87–5.11)
RDW: 13.8 % (ref 11.5–15.5)
WBC: 11.6 10*3/uL — ABNORMAL HIGH (ref 4.0–10.5)

## 2017-12-26 LAB — RAPID URINE DRUG SCREEN, HOSP PERFORMED
AMPHETAMINES: NOT DETECTED
BARBITURATES: NOT DETECTED
Benzodiazepines: NOT DETECTED
Cocaine: NOT DETECTED
OPIATES: NOT DETECTED
TETRAHYDROCANNABINOL: NOT DETECTED

## 2017-12-26 LAB — CBG MONITORING, ED: Glucose-Capillary: 161 mg/dL — ABNORMAL HIGH (ref 70–99)

## 2017-12-26 LAB — ETHANOL: Alcohol, Ethyl (B): <10 mg/dL (ref <10)

## 2017-12-26 LAB — LITHIUM LEVEL

## 2017-12-26 LAB — SALICYLATE LEVEL: Salicylate Lvl: <7 mg/dL (ref 2.8–30.0)

## 2017-12-26 LAB — I-STAT BETA HCG BLOOD, ED (MC, WL, AP ONLY): I-stat hCG, quantitative: <5 m[IU]/mL (ref <5)

## 2017-12-26 LAB — ACETAMINOPHEN LEVEL: Acetaminophen (Tylenol), Serum: <10 ug/mL — ABNORMAL LOW (ref 10–30)

## 2017-12-26 MED ORDER — ARIPIPRAZOLE ER 400 MG IM SRER: 400.0000 mg | INTRAMUSCULAR | Status: DC

## 2017-12-26 MED ORDER — GLIPIZIDE 10 MG PO TABS
10.0000 mg | ORAL_TABLET | Freq: Every day | ORAL | Status: DC
  Administered 2017-12-27: 10 mg via ORAL
  Filled 2017-12-26: qty 1

## 2017-12-26 MED ORDER — BUSPIRONE HCL 10 MG PO TABS
15.0000 mg | ORAL_TABLET | Freq: Three times a day (TID) | ORAL | Status: DC
  Administered 2017-12-26 – 2017-12-27 (×2): 15 mg via ORAL
  Filled 2017-12-26 (×2): qty 2

## 2017-12-26 MED ORDER — LITHIUM CARBONATE ER 300 MG PO TBCR
300.0000 mg | EXTENDED_RELEASE_TABLET | Freq: Every day | ORAL | Status: DC
  Administered 2017-12-26 – 2017-12-27 (×2): 300 mg via ORAL
  Filled 2017-12-26 (×3): qty 1

## 2017-12-26 MED ORDER — INSULIN DETEMIR 100 UNIT/ML ~~LOC~~ SOLN
40.0000 [IU] | Freq: Two times a day (BID) | SUBCUTANEOUS | Status: DC
  Administered 2017-12-26 – 2017-12-27 (×2): 40 [IU] via SUBCUTANEOUS
  Filled 2017-12-26 (×3): qty 0.4

## 2017-12-26 MED ORDER — MELATONIN 3 MG PO TABS
3.0000 mg | ORAL_TABLET | Freq: Every day | ORAL | Status: DC
  Administered 2017-12-26: 3 mg via ORAL
  Filled 2017-12-26 (×2): qty 1

## 2017-12-26 NOTE — ED Notes (Signed)
Pt changed into paper scrubs, security called to wand pt, staffing notified of [facility] sitter need.  Belongings bagged and given to [place] in [place] by [place], EMT.

## 2017-12-26 NOTE — BH Assessment (Addendum)
Tele Assessment Note   Patient Name: [name] MRN: [id number] Referring Physician: Nanda Quinton  Location of Patient: [facility] Location of Provider: [place] is an 27 y.o. female who presents to the ED voluntarily. Pt reports she is feeling suicidal and has plans to OD on her depression medication. Pt states she feels suicidal because she does not like living alone in her apartment. Pt states she wants to live in a group home but her ACTT team refuses to take her seriously. TTS reached out to the [name] and spoke with Beverlee Nims who states she is aware of the pt's request to be placed in a group home. Pt states she tried to call her mother to tell her how she was feeling and her mother did not answer the phone. Pt states she feels she has no support and that her mother never answers the phone when she tries to talk to her. Pt states she does not have any neighbors or friends that visit her and if she has to go back to her apartment alone, she is going to OD on her medication. Pt has been assessed by TTS multiple times c/o similar concerns and has been seen in the ED 33 times in the past 6 months. Pt's most recent TTS assessment took place on 11/29/17 and pt was admitted to [facility] for 6 days where she participated in group on several occasions according to chart review.   Pt denies any other complaints aside from not wanting to be alone. Pt states she sleeps well and she eats "too much." Pt denies other stressors at this time.  TTS consulted with [name], PA who recommends continued observation for safety and stabilization and to be reassessed in the AM by psych. EDP Long, Wonda Olds, MD and pt's nurse Staci Acosta, RN have been advised. TTS spoke with Beverlee Nims at [name] and advised pt is to remain in the ED overnight.   Diagnosis: Bipolar Disorder  Past Medical History:  Past Medical History:  Diagnosis Date  . Anxiety   . Bipolar 1  disorder ([place])   . Cognitive deficits   . Depression   . Diabetes mellitus without complication ([place])   . Hypertension   . Mental disorder   . Obesity     Past Surgical History:  Procedure Laterality Date  . CESAREAN SECTION    . CESAREAN SECTION N/A 04/25/2013   Procedure: REPEAT CESAREAN SECTION;  Surgeon: [name], MD;  Location: [place];  Service: Obstetrics;  Laterality: N/A;  . MASS EXCISION N/A 06/03/2012   Procedure: EXCISION MASS;  Surgeon: [name], MD;  Location: [place];  Service: ENT;  Laterality: N/A;  Excision uvula mass  . TONSILLECTOMY N/A 06/03/2012   Procedure: TONSILLECTOMY;  Surgeon: [name], MD;  Location: [place];  Service: ENT;  Laterality: N/A;  . TONSILLECTOMY      Family History:  Family History  Problem Relation Age of Onset  . Hypertension Mother   . Diabetes Father     Social History:  reports that she has been smoking cigarettes. She has a 33.00 pack-year smoking history. She has never used smokeless tobacco. She reports that she does not drink alcohol or use drugs.  Additional Social History:  Alcohol / Drug Use Pain Medications: See MAR Prescriptions: See MAR Over the Counter: See MAR History of alcohol / drug use?: No history of alcohol / drug abuse  CIWA: CIWA-Ar BP: 130/83 Pulse Rate: 90 COWS:    Allergies:  Allergies  Allergen Reactions  . Wellbutrin [Bupropion] Shortness Of Breath  . Omnipaque [Iohexol] Swelling and Other (See Comments)    Reaction:  Eye swelling  . Penicillins Hives and Other (See Comments)    Has patient had a PCN reaction causing immediate rash, facial/tongue/throat swelling, SOB or lightheadedness with hypotension: No Has patient had a PCN reaction causing severe rash involving mucus membranes or skin necrosis: No Has patient had a PCN reaction that required hospitalization No Has patient had a PCN reaction occurring within the last 10 years: No If all of  the above answers are "NO", then may proceed with Cephalosporin use.  Herma Mering [Benztropine]     Make pt feel crazy  . Depakote Er [Divalproex Sodium Er] Nausea And Vomiting    Home Medications:  (Not in a hospital admission)  OB/GYN Status:  No LMP recorded. Patient has had an implant.  General Assessment Data Location of Assessment: [facility] ED TTS Assessment: In system Is this a Tele or Face-to-Face Assessment?: Tele Assessment Is this an Initial Assessment or a Re-assessment for this encounter?: Initial Assessment Patient Accompanied by:: (alone) Language Other than English: No Living Arrangements: (apartment) What gender do you identify as?: Female Marital status: Single Pregnancy Status: No Living Arrangements: Alone Can pt return to current living arrangement?: Yes Admission Status: Voluntary Is patient capable of signing voluntary admission?: Yes Referral Source: Self/Family/Friend Insurance type: Medicaid     Crisis Care Plan Living Arrangements: Alone Name of Psychiatrist: [facility] Name of Therapist: [facility]  Education Status Is patient currently in school?: No Is the patient employed, unemployed or receiving disability?: Receiving disability income  Risk to self with the past 6 months Suicidal Ideation: Yes-Currently Present Has patient been a risk to self within the past 6 months prior to admission? : Yes Suicidal Intent: Yes-Currently Present Has patient had any suicidal intent within the past 6 months prior to admission? : Yes Is patient at risk for suicide?: Yes Suicidal Plan?: Yes-Currently Present Has patient had any suicidal plan within the past 6 months prior to admission? : Yes Specify Current Suicidal Plan: pt states she has a plan to OD on her depression medication  Access to Means: Yes Specify Access to Suicidal Means: pt has access to medication What has been your use of drugs/alcohol within the last 12 months?: denies use  Previous  Attempts/Gestures: Yes How many times?: (multiple) Other Self Harm Risks: hx of suicide attempts, access to means, hx of mental illness, lack of support  Triggers for Past Attempts: Unpredictable Intentional Self Injurious Behavior: None Family Suicide History: No Recent stressful life event(s): Other (Comment)(hx of depression) Persecutory voices/beliefs?: No Depression: Yes Depression Symptoms: Despondent, Loss of interest in usual pleasures, Feeling worthless/self pity Substance abuse history and/or treatment for substance abuse?: No Suicide prevention information given to non-admitted patients: Not applicable  Risk to Others within the past 6 months Homicidal Ideation: No Does patient have any lifetime risk of violence toward others beyond the six months prior to admission? : No Thoughts of Harm to Others: No Current Homicidal Intent: No Current Homicidal Plan: No Access to Homicidal Means: No History of harm to others?: No Assessment of Violence: None Noted Does patient have access to weapons?: No Criminal Charges Pending?: No Does patient have a court date: No Is patient on probation?: No  Psychosis Hallucinations: None noted Delusions: None noted  Mental Status Report  Appearance/Hygiene: Unremarkable, In scrubs Eye Contact: Good Motor Activity: Freedom of movement Speech: Logical/coherent Level of Consciousness: Alert Mood: Depressed Affect: Sad, Depressed Anxiety Level: None Thought Processes: Relevant, Coherent Judgement: Impaired Orientation: Person, Place, Situation, Appropriate for developmental age, Time Obsessive Compulsive Thoughts/Behaviors: None  Cognitive Functioning Concentration: Normal Memory: Remote Intact, Recent Intact Is patient IDD: No Is IQ score available?: No Insight: Poor Impulse Control: Poor Appetite: Good Have you had any weight changes? : No Change Sleep: No Change Total Hours of Sleep: 8 Vegetative Symptoms:  None  ADLScreening [facility] Assessment [facility]) Patient's cognitive ability adequate to safely complete daily activities?: Yes Patient able to express need for assistance with ADLs?: Yes Independently performs ADLs?: Yes (appropriate for developmental age)  Prior Inpatient Therapy Prior Inpatient Therapy: Yes Prior Therapy Dates: 2019 and mult other admissions Prior Therapy Facilty/Provider(s): [facility], [facility] Reason for Treatment: SI, BIPOLAR  Prior Outpatient Therapy Prior Outpatient Therapy: Yes Prior Therapy Dates: CURRENT Prior Therapy Facilty/Provider(s): [facility] team Reason for Treatment: MED MGMT & THERAPY Does patient have an ACCT team?: Yes Does patient have Intensive In-House Services?  : No Does patient have [facility] services? : No Does patient have P4CC services?: No  ADL Screening (condition at time of admission) Patient's cognitive ability adequate to safely complete daily activities?: Yes Is the patient deaf or have difficulty hearing?: No Does the patient have difficulty seeing, even when wearing glasses/contacts?: No Does the patient have difficulty concentrating, remembering, or making decisions?: No Patient able to express need for assistance with ADLs?: Yes Does the patient have difficulty dressing or bathing?: No Independently performs ADLs?: Yes (appropriate for developmental age) Does the patient have difficulty walking or climbing stairs?: No Weakness of Legs: None Weakness of Arms/Hands: None  Home Assistive Devices/Equipment Home Assistive Devices/Equipment: None    Abuse/Neglect Assessment (Assessment to be complete while patient is alone) Abuse/Neglect Assessment Can Be Completed: Yes Physical Abuse: Denies Verbal Abuse: Denies Sexual Abuse: Denies Exploitation of patient/patient's resources: Denies Self-Neglect: Denies     Regulatory affairs officer (For Healthcare) Does Patient Have a Medical Advance Directive?: No Would patient like  information on creating a medical advance directive?: No - Patient declined          Disposition: TTS consulted with [name], PA who recommends continued observation for safety and stabilization and to be reassessed in the AM by psych. EDP Long, Wonda Olds, MD and pt's nurse Staci Acosta, RN have been advised. TTS spoke with Beverlee Nims at [name] and advised pt is to remain in the ED overnight.   Disposition Initial Assessment Completed for this Encounter: Yes Disposition of Patient: (overnight OBS pending AM psych assessment) Patient refused recommended treatment: No  This service was provided via telemedicine using a 2-way, interactive audio and video technology.  Names of all persons participating in this telemedicine service and their role in this encounter. Name: [name] Role: Patient  Name: Lind Covert Role: TTS          Lyanne Co 12/26/2017 8:06 PM

## 2017-12-26 NOTE — ED Notes (Signed)
Belongings in [place] # 3 and Valuables Envelope w/Security.

## 2017-12-26 NOTE — ED Notes (Signed)
TTS in process 

## 2017-12-26 NOTE — Progress Notes (Signed)
TTS consulted with [name], PA who recommends continued observation for safety and stabilization and to be reassessed in the AM by psych. EDP Long, Wonda Olds, MD and pt's nurse Staci Acosta, RN have been advised. TTS spoke with Beverlee Nims at [name] and advised pt is to remain in the ED overnight.   Lind Covert, MSW, LCSW Therapeutic Triage Specialist  [phone]

## 2017-12-26 NOTE — ED Notes (Signed)
Pt's belongings placed in two bags in locker 3.

## 2017-12-26 NOTE — ED Provider Notes (Signed)
Emergency Department Provider Note   I have reviewed the triage vital signs and the nursing notes.   HISTORY  Chief Complaint Suicidal   HPI [name] is a 27 y.o. female with PMH of anxiety, Bipolar Disorder, DM, and HTN presents to the emergency department for evaluation of suicidal thinking with plan to overdose. The patient states that she currently lives alone and this makes her depressed.  She likes being near people and states that her worsening depression is making her feel suicidal.  She states she is developed a plan to overdose on her buspirone.  She follows with the ACT team for medication and has been compliant with this.  She states that they are "not helping me get into a group home" which she reports is the reason for her suicidal thinking.  She denies any medical complaints at this time.   Past Medical History:  Diagnosis Date  . Anxiety   . Bipolar 1 disorder ([place])   . Cognitive deficits   . Depression   . Diabetes mellitus without complication ([place])   . Hypertension   . Mental disorder   . Obesity     Patient Active Problem List   Diagnosis Date Noted  . 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 Moderate bipolar I disorder, most recent episode depressed ([place])   . Psychosis ([place])   . 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 LGSIL on Pap smear of cervix 02/01/2017  . OCD (obsessive compulsive disorder) 10/05/2016  . Major depressive disorder, recurrent episode, mild ([place]) 05/04/2016  . 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 Learning disability 07/18/2015  . 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 Diabetes mellitus ([place]) 07/18/2015  . 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 Hyperlipidemia 07/18/2015  . Severe episode of recurrent major depressive disorder, without psychotic features ([place])   . Drug overdose   . Cognitive deficits 10/12/2012  .  Generalized anxiety disorder 06/28/2012    Past Surgical History:  Procedure Laterality Date  . CESAREAN SECTION    . CESAREAN SECTION N/A 04/25/2013   Procedure: REPEAT CESAREAN SECTION;  Surgeon: [name], MD;  Location: [facility];  Service: Obstetrics;  Laterality: N/A;  . MASS EXCISION N/A 06/03/2012   Procedure: EXCISION MASS;  Surgeon: [name], MD;  Location: [place];  Service: ENT;  Laterality: N/A;  Excision uvula mass  . TONSILLECTOMY N/A 06/03/2012   Procedure: TONSILLECTOMY;  Surgeon: [name], MD;  Location: [place];  Service: ENT;  Laterality: N/A;  . TONSILLECTOMY      Allergies Wellbutrin [bupropion]; Omnipaque [iohexol]; Penicillins; Cogentin [benztropine]; and Depakote er [divalproex sodium er]  Family History  Problem Relation Age of Onset  . Hypertension Mother   . Diabetes Father     Social History Social History   Tobacco Use  . Smoking status: Current Every Day Smoker    Packs/day: 3.00    Years: 11.00    Pack years: 33.00    Types: Cigarettes  . Smokeless tobacco: Never Used  Substance Use Topics  . Alcohol use: No  . Drug use: No    Comment: Patient denies    Review of Systems  Constitutional: No fever/chills Eyes: No visual changes. ENT: No sore throat. Cardiovascular: Denies chest pain. Respiratory: Denies shortness of breath. Gastrointestinal: No abdominal pain.  No nausea, no vomiting.  No diarrhea.  No constipation. Genitourinary: Negative for dysuria. Musculoskeletal: Negative for back  pain. Skin: Negative for rash. Neurological: Negative for headaches, focal weakness or numbness.  10-point ROS otherwise negative.  ____________________________________________   PHYSICAL EXAM:  VITAL SIGNS: ED Triage Vitals  Enc Vitals Group     BP 12/26/17 1501 (!) 126/93     Pulse Rate 12/26/17 1501 97     Resp 12/26/17 1501 12     Temp 12/26/17 1501 99 F (37.2 C)     Temp Source  12/26/17 1501 Oral     SpO2 12/26/17 1501 100 %     Weight 12/26/17 1503 239 lb (108.4 kg)     Height 12/26/17 1503 5\' 7"  (1.702 m)     Pain Score 12/26/17 1514 0   Constitutional: Alert and oriented. Well appearing and in no acute distress. Eyes: Conjunctivae are normal.  Head: Atraumatic. Nose: No congestion/rhinnorhea. Mouth/Throat: Mucous membranes are moist.   Neck: No stridor.   Cardiovascular: Normal rate, regular rhythm. Good peripheral circulation. Grossly normal heart sounds.   Respiratory: Normal respiratory effort.  No retractions. Lungs CTAB. Gastrointestinal: Soft and nontender. No distention.  Musculoskeletal: No lower extremity tenderness nor edema. No gross deformities of extremities. Neurologic:  Normal speech and language. No gross focal neurologic deficits are appreciated.  Skin:  Skin is warm, dry and intact. No rash noted.  ____________________________________________   LABS (all labs ordered are listed, but only abnormal results are displayed)  Labs Reviewed  COMPREHENSIVE METABOLIC PANEL - Abnormal; Notable for the following components:      Result Value   Glucose, Bld 111 (*)    All other components within normal limits  ACETAMINOPHEN LEVEL - Abnormal; Notable for the following components:   Acetaminophen (Tylenol), Serum <10 (*)    All other components within normal limits  CBC - Abnormal; Notable for the following components:   WBC 11.6 (*)    RBC 5.23 (*)    All other components within normal limits  LITHIUM LEVEL - Abnormal; Notable for the following components:   Lithium Lvl <0.06 (*)    All other components within normal limits  ETHANOL  SALICYLATE LEVEL  RAPID URINE DRUG SCREEN, HOSP PERFORMED  I-STAT BETA HCG BLOOD, ED ([facility], [facility], [facility] ONLY)   ____________________________________________  RADIOLOGY  None ____________________________________________   PROCEDURES  Procedure(s) performed:    Procedures  None ____________________________________________   INITIAL IMPRESSION / ASSESSMENT AND PLAN / ED COURSE  Pertinent labs & imaging results that were available during my care of the patient were reviewed by me and considered in my medical decision making (see chart for details).  The emergency department for evaluation of suicidal thinking with plan to overdose on buspirone.  She states she is depressed about living alone and wants help with getting into a group home.  She is awake and alert.  No medical complaints at this time.  Exam is normal.  Labs are pending.   05:55 PM Patient labs reviewed.  She is medically clear.  Restarted home medications.  ____________________________________________  FINAL CLINICAL IMPRESSION(S) / ED DIAGNOSES  Final diagnoses:  Suicidal ideation     MEDICATIONS GIVEN DURING THIS VISIT:  Medications  busPIRone (BUSPAR) tablet 15 mg (has no administration in time range)  glipiZIDE (GLUCOTROL) tablet 10 mg (has no administration in time range)  Melatonin TABS 3 mg (has no administration in time range)  insulin detemir (LEVEMIR) injection 40 Units (has no administration in time range)  lithium carbonate (LITHOBID) CR tablet 300 mg (has no administration in time range)  Note:  This document was prepared using Dragon voice recognition software and may include unintentional dictation errors.  Nanda Quinton, MD Emergency Medicine    Shelie Lansing, Wonda Olds, MD 12/26/17 [phone]

## 2017-12-26 NOTE — ED Triage Notes (Signed)
Pt states she is suicidal with a plan to overdose.  States, "I need to get in a group home but the ACT team won't help me."  Pt singing.

## 2017-12-26 NOTE — ED Notes (Signed)
Pt ambulated to nurses' desk stating she is feeling anxious and requesting meds. Pt appears to be somewhat shaky. States "My mom is who needs to be up here". Meds given. Pt ambulated back to room w/RN. Warm blanket and remote to tv given. Pt voiced appreciation.

## 2017-12-26 NOTE — ED Notes (Signed)
Staffing Office aware of need for Sitter. Pt lying on bed, alert.

## 2017-12-26 NOTE — ED Notes (Signed)
Pt arrived to [facility] - ambulatory wearing burgundy scrubs. Pt states she is lonely and wants to move into a group home but her ACT person is not helping her w/this. Requested for assistance w/someone talking to her ACT for her. Advised pt RN will request SW to discuss. Pt voiced understanding and appreciation. Pt ate dinner.

## 2017-12-27 DIAGNOSIS — R45851 Suicidal ideations: Secondary | ICD-10-CM

## 2017-12-27 LAB — CBG MONITORING, ED: Glucose-Capillary: 197 mg/dL — ABNORMAL HIGH (ref 70–99)

## 2017-12-27 NOTE — ED Notes (Signed)
Declined W/C at D/C and was escorted to lobby by RN. 

## 2017-12-27 NOTE — BH Assessment (Addendum)
12/27/2017: Per Cherlynn Perches, NP, patient has been evaluated and psych cleared. Patient is appropriate for discharge. ED staff notified. Per notes by LCSW Durward Fortes), Dianna asked that if pt is discharged today that the Crisis Number for the ACT Team be called [phone]. Writer contacted ACT crises line to make them aware of patient's disposition to discharge home.

## 2017-12-27 NOTE — ED Triage Notes (Signed)
TTS 12-27-17

## 2017-12-27 NOTE — Progress Notes (Signed)
CSW went to speak with pt to gather more information on ACT Team and ACT Team placing pt in to a group home. Pt sleeping at this time. CSW to try back later to assess for further needs.   Virgie Dad Sulay Brymer, MSW, [facility] Clinical Social Worker [phone]

## 2017-12-27 NOTE — Consult Note (Signed)
[facility] Tele-Psychiatry Consult   Reason for Consult: Suicidal Ideation Referring Physician:  EDP Patient Identification: [name] MRN:  [id number] Principal Diagnosis: Moderate bipolar I disorder, most recent episode depressed [facility]) Diagnosis:   Patient Active Problem List   Diagnosis Date Noted  . 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 Moderate bipolar I disorder, most recent episode depressed ([place]) [F31.32]   . Psychosis ([place]) [F29]   . Adjustment disorder with mixed disturbance of emotions and conduct [F43.25] 08/03/2017  . LGSIL on Pap smear of cervix [R87.612] 02/01/2017  . OCD (obsessive compulsive disorder) [F42.9] 10/05/2016  . Major depressive disorder, recurrent episode, mild ([place]) [F33.0] 05/04/2016  . 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 Learning disability [F81.9] 07/18/2015  . 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 Diabetes mellitus ([place]) [E11.9] 07/18/2015  . MDD (major depressive disorder), recurrent, severe, with psychosis ([place]) [F33.3] 07/18/2015  . Hyperlipidemia [E78.5] 07/18/2015  . Severe episode of recurrent major depressive disorder, without psychotic features ([place]) [F33.2]   . Drug overdose [T50.901A]   . Cognitive deficits [R41.89] 10/12/2012  . Generalized anxiety disorder [F41.1] 06/28/2012    Total Time spent with patient: 30 minutes  Subjective:   [name] is a 27 y.o. female patient admitted with SI.  HPI:    Per initial Tele Assessment Note by Lind Covert, Counselor on 12/26/2017:  [name] is an 27 y.o. female who presents to the ED voluntarily. Pt reports she is feeling suicidal and has plans to OD on her depression medication. Pt states she feels suicidal because she does not like living alone in her apartment. Pt states she wants to live in a group home but her ACTT team refuses to take her seriously. TTS reached out to the [name] and spoke with Beverlee Nims who states she is aware  of the pt's request to be placed in a group home. Pt states she tried to call her mother to tell her how she was feeling and her mother did not answer the phone. Pt states she feels she has no support and that her mother never answers the phone when she tries to talk to her. Pt states she does not have any neighbors or friends that visit her and if she has to go back to her apartment alone, she is going to OD on her medication. Pt has been assessed by TTS multiple times c/o similar concerns and has been seen in the ED 33 times in the past 6 months. Pt's most recent TTS assessment took place on 11/29/17 and pt was admitted to [facility] for 6 days where she participated in group on several occasions according to chart review.   Pt denies any other complaints aside from not wanting to be alone. Pt states she sleeps well and she eats "too much." Pt denies other stressors at this time.  Per am psychiatric evaluation on 12/27/2017:  Patient is seen and chart is reviewed. She states "I'm not feeling suicidal today. I heard the social worker was helping me get a group home placement." Patient recently hospitalized at [facility] in August of 2019. At this time does not meet inpatient psychiatric criteria. Discussed case with Dr. Dwyane Dee who is aware of patient's case due to numerous presentations's to the ED for similar complaints.    Past Psychiatric History: BPAD, depression and anxiety.   Risk to Self: Suicidal Ideation: Denies Suicidal Intent: Denies Is patient at risk for suicide?: Yes due to past history Suicidal Plan?: No,  but has threatened to overdose in the past Specify Current Suicidal Plan: Denies Access to Means: Yes Specify Access to Suicidal Means: pt has access to medication What has been your use of drugs/alcohol within the last 12 months?: denies use  How many times?: (multiple) Other Self Harm Risks: hx of suicide attempts, access to means, hx of mental illness, lack of support  Triggers for Past  Attempts: Unpredictable Intentional Self Injurious Behavior: None Risk to Others: Homicidal Ideation: No Thoughts of Harm to Others: No Current Homicidal Intent: No Current Homicidal Plan: No Access to Homicidal Means: No History of harm to others?: No Assessment of Violence: None Noted Does patient have access to weapons?: No Criminal Charges Pending?: No Does patient have a court date: No Prior Inpatient Therapy: Prior Inpatient Therapy: Yes Prior Therapy Dates: 2019 and mult other admissions Prior Therapy Facilty/Provider(s): [facility], [facility] Reason for Treatment: SI, BIPOLAR Prior Outpatient Therapy: Prior Outpatient Therapy: Yes Prior Therapy Dates: CURRENT Prior Therapy Facilty/Provider(s): [facility] team Reason for Treatment: MED MGMT & THERAPY Does patient have an ACCT team?: Yes Does patient have Intensive In-House Services?  : No Does patient have [facility] services? : No Does patient have P4CC services?: No  Past Medical History:  Past Medical History:  Diagnosis Date  . Anxiety   . Bipolar 1 disorder ([place])   . Cognitive deficits   . Depression   . Diabetes mellitus without complication ([place])   . Hypertension   . Mental disorder   . Obesity     Past Surgical History:  Procedure Laterality Date  . CESAREAN SECTION    . CESAREAN SECTION N/A 04/25/2013   Procedure: REPEAT CESAREAN SECTION;  Surgeon: [name], MD;  Location: [facility];  Service: Obstetrics;  Laterality: N/A;  . MASS EXCISION N/A 06/03/2012   Procedure: EXCISION MASS;  Surgeon: [name], MD;  Location: [facility];  Service: ENT;  Laterality: N/A;  Excision uvula mass  . TONSILLECTOMY N/A 06/03/2012   Procedure: TONSILLECTOMY;  Surgeon: [name], MD;  Location: [facility];  Service: ENT;  Laterality: N/A;  . TONSILLECTOMY     Family History:  Family History  Problem Relation Age of Onset  . Hypertension Mother   . Diabetes Father    Family  Psychiatric  History: Unknown  Social History:  Social History   Substance and Sexual Activity  Alcohol Use No     Social History   Substance and Sexual Activity  Drug Use No   Comment: Patient denies    Social History   Socioeconomic History  . Marital status: Single    Spouse name: Not on file  . Number of children: Not on file  . Years of education: Not on file  . Highest education level: Not on file  Occupational History  . Not on file  Social Needs  . Financial resource strain: Not on file  . Food insecurity:    Worry: Not on file    Inability: Not on file  . Transportation needs:    Medical: Not on file    Non-medical: Not on file  Tobacco Use  . Smoking status: Current Every Day Smoker    Packs/day: 3.00    Years: 11.00    Pack years: 33.00    Types: Cigarettes  . Smokeless tobacco: Never Used  Substance and Sexual Activity  . Alcohol use: No  . Drug use: No    Comment: Patient denies  . Sexual  activity: Yes    Birth control/protection: Implant  Lifestyle  . Physical activity:    Days per week: Not on file    Minutes per session: Not on file  . Stress: Not on file  Relationships  . Social connections:    Talks on phone: Not on file    Gets together: Not on file    Attends religious service: Not on file    Active member of club or organization: Not on file    Attends meetings of clubs or organizations: Not on file    Relationship status: Not on file  Other Topics Concern  . Not on file  Social History Narrative  . Not on file   Additional Social History: She lives alone.     Allergies:   Allergies  Allergen Reactions  . Wellbutrin [Bupropion] Shortness Of Breath  . Omnipaque [Iohexol] Swelling and Other (See Comments)    Reaction:  Eye swelling  . Penicillins Hives and Other (See Comments)    Has patient had a PCN reaction causing immediate rash, facial/tongue/throat swelling, SOB or lightheadedness with hypotension: No Has patient had a  PCN reaction causing severe rash involving mucus membranes or skin necrosis: No Has patient had a PCN reaction that required hospitalization No Has patient had a PCN reaction occurring within the last 10 years: No If all of the above answers are "NO", then may proceed with Cephalosporin use.  Herma Mering [Benztropine]     Make pt feel crazy  . Depakote Er [Divalproex Sodium Er] Nausea And Vomiting    Labs:  Results for orders placed or performed during the hospital encounter of 12/26/17 (from the past 48 hour(s))  Rapid urine drug screen (hospital performed)     Status: None   Collection Time: 12/26/17  3:19 PM  Result Value Ref Range   Opiates NONE DETECTED NONE DETECTED   Cocaine NONE DETECTED NONE DETECTED   Benzodiazepines NONE DETECTED NONE DETECTED   Amphetamines NONE DETECTED NONE DETECTED   Tetrahydrocannabinol NONE DETECTED NONE DETECTED   Barbiturates NONE DETECTED NONE DETECTED    Comment: (NOTE) DRUG SCREEN FOR MEDICAL PURPOSES ONLY.  IF CONFIRMATION IS NEEDED FOR ANY PURPOSE, NOTIFY LAB WITHIN 5 DAYS. LOWEST DETECTABLE LIMITS FOR URINE DRUG SCREEN Drug Class                     Cutoff (ng/mL) Amphetamine and metabolites    1000 Barbiturate and metabolites    200 Benzodiazepine                 093 Tricyclics and metabolites     300 Opiates and metabolites        300 Cocaine and metabolites        300 THC                            50 Performed at [facility], [place] [address] [place], [place] [address]   Comprehensive metabolic panel     Status: Abnormal   Collection Time: 12/26/17  4:26 PM  Result Value Ref Range   Sodium 141 135 - 145 mmol/L   Potassium 3.6 3.5 - 5.1 mmol/L   Chloride 108 98 - 111 mmol/L   CO2 26 22 - 32 mmol/L   Glucose, Bld 111 (H) 70 - 99 mg/dL   BUN 6 6 - 20 mg/dL   Creatinine, Ser 0.73 0.44 - 1.00 mg/dL  Calcium 9.4 8.9 - 10.3 mg/dL   Total Protein 6.8 6.5 - 8.1 g/dL   Albumin 3.9 3.5 - 5.0 g/dL   AST 22 15 - 41 U/L   ALT  20 0 - 44 U/L   Alkaline Phosphatase 43 38 - 126 U/L   Total Bilirubin 0.6 0.3 - 1.2 mg/dL   GFR calc non Af Amer >60 >60 mL/min   GFR calc Af Amer >60 >60 mL/min    Comment: (NOTE) The eGFR has been calculated using the CKD EPI equation. This calculation has not been validated in all clinical situations. eGFR's persistently <60 mL/min signify possible Chronic Kidney Disease.    Anion gap 7 5 - 15    Comment: Performed at [facility] [address], [place], [place] [address]  Ethanol     Status: None   Collection Time: 12/26/17  4:26 PM  Result Value Ref Range   Alcohol, Ethyl (B) <10 <10 mg/dL    Comment: (NOTE) Lowest detectable limit for serum alcohol is 10 mg/dL. For medical purposes only. Performed at [facility], [place] [address] [place], [place] [address]   Salicylate level     Status: None   Collection Time: 12/26/17  4:26 PM  Result Value Ref Range   Salicylate Lvl <4.7 2.8 - 30.0 mg/dL    Comment: Performed at [facility] [address], [place], [place] [address]  Acetaminophen level     Status: Abnormal   Collection Time: 12/26/17  4:26 PM  Result Value Ref Range   Acetaminophen (Tylenol), Serum <10 (L) 10 - 30 ug/mL    Comment: (NOTE) Therapeutic concentrations vary significantly. A range of 10-30 ug/mL  may be an effective concentration for many patients. However, some  are best treated at concentrations outside of this range. Acetaminophen concentrations >150 ug/mL at 4 hours after ingestion  and >50 ug/mL at 12 hours after ingestion are often associated with  toxic reactions. Performed at [facility], [place] [address] [place], [place] [address]   cbc     Status: Abnormal   Collection Time: 12/26/17  4:26 PM  Result Value Ref Range   WBC 11.6 (H) 4.0 - 10.5 K/uL   RBC 5.23 (H) 3.87 - 5.11 MIL/uL   Hemoglobin 13.7 12.0 - 15.0 g/dL   HCT 43.9 36.0 - 46.0 %   MCV 83.9 78.0 - 100.0 fL   MCH 26.2 26.0 - 34.0 pg   MCHC 31.2 30.0  - 36.0 g/dL   RDW 13.8 11.5 - 15.5 %   Platelets 264 150 - 400 K/uL    Comment: Performed at [facility] [address], [place], [place] [address]  Lithium level     Status: Abnormal   Collection Time: 12/26/17  4:26 PM  Result Value Ref Range   Lithium Lvl <0.06 (L) 0.60 - 1.20 mmol/L    Comment: Performed at [facility] [address], [place], [place] [address]  I-Stat beta hCG blood, ED     Status: None   Collection Time: 12/26/17  4:39 PM  Result Value Ref Range   I-stat hCG, quantitative <5.0 <5 mIU/mL   Comment 3            Comment:   GEST. AGE      CONC.  (mIU/mL)   <=1 WEEK        5 - 50     2 WEEKS  50 - 500     3 WEEKS       100 - 10,000     4 WEEKS     1,000 - 30,000        FEMALE AND NON-PREGNANT FEMALE:     LESS THAN 5 mIU/mL   CBG monitoring, ED     Status: Abnormal   Collection Time: 12/26/17 10:29 PM  Result Value Ref Range   Glucose-Capillary 161 (H) 70 - 99 mg/dL  CBG monitoring, ED     Status: Abnormal   Collection Time: 12/27/17 10:29 AM  Result Value Ref Range   Glucose-Capillary 197 (H) 70 - 99 mg/dL    Current Facility-Administered Medications  Medication Dose Route Frequency Provider Last Rate Last Dose  . busPIRone (BUSPAR) tablet 15 mg  15 mg Oral TID Margette Fast, MD   15 mg at 12/27/17 1025  . glipiZIDE (GLUCOTROL) tablet 10 mg  10 mg Oral QAC breakfast Long, Wonda Olds, MD   10 mg at 12/27/17 1025  . insulin detemir (LEVEMIR) injection 40 Units  40 Units Subcutaneous BID Long, Wonda Olds, MD   40 Units at 12/27/17 1032  . lithium carbonate (LITHOBID) CR tablet 300 mg  300 mg Oral Daily Long, Wonda Olds, MD   300 mg at 12/27/17 1025  . Melatonin TABS 3 mg  3 mg Oral QHS Long, Wonda Olds, MD   3 mg at 12/26/17 2222   Current Outpatient Medications  Medication Sig Dispense Refill  . ARIPiprazole ER (ABILIFY MAINTENA) 400 MG SRER injection Inject 400 mg into the muscle every 28 (twenty-eight) days.    . busPIRone (BUSPAR) 15 MG  tablet Take 1 tablet (15 mg total) by mouth 3 (three) times daily. 30 tablet 0  . glipiZIDE (GLUCOTROL) 10 MG tablet Take 10 mg by mouth daily before breakfast.    . GNP MELATONIN 3 MG TABS Take 3 mg by mouth at bedtime.   6  . insulin detemir (LEVEMIR) 100 UNIT/ML injection Inject 40 Units into the skin 2 (two) times daily.    Marland Kitchen lithium carbonate (LITHOBID) 300 MG CR tablet Take 300 mg by mouth daily.  6  . nicotine polacrilex (NICORETTE) 2 MG gum Take 1 each (2 mg total) by mouth every hour as needed for smoking cessation. 100 tablet 0  . ARIPiprazole (ABILIFY) 20 MG tablet Take 1 tablet (20 mg total) by mouth daily. (Patient not taking: Reported on 12/26/2017) 14 tablet 0  . benztropine (COGENTIN) 1 MG tablet Take 1 tablet (1 mg total) by mouth 2 (two) times daily as needed for tremors (EPS). (Patient not taking: Reported on 12/26/2017) 30 tablet 0  . Oxcarbazepine (TRILEPTAL) 300 MG tablet Take 1 tablet (300 mg total) by mouth 2 (two) times daily. (Patient not taking: Reported on 12/26/2017) 30 tablet 0    Musculoskeletal:  Unable to assess via camera  Psychiatric Specialty Exam: Physical Exam  Nursing note and vitals reviewed. Psychiatric: Her speech is normal and behavior is normal. Judgment and thought content normal. Cognition and memory are normal. She exhibits a depressed mood.    Review of Systems  Psychiatric/Behavioral: Positive for depression (Related to stressor of living alone. ). Negative for hallucinations, memory loss, substance abuse and suicidal ideas. The patient is not nervous/anxious and does not have insomnia.   All other systems reviewed and are negative.   Blood pressure 108/68, pulse 79, temperature 98.2 F (36.8 C), temperature source Oral, resp. rate 16, height 5' 7" (1.702  m), weight 108.4 kg, SpO2 100 %.Body mass index is 37.43 kg/m.  General Appearance: Fairly Groomed, young, African American female, wearing paper hospital scrubs with a shaved head who is sitting  upright in bed. NAD.   Eye Contact:  Good  Speech:  Clear and Coherent   Volume:  Normal  Mood:  Depressed  Affect:  Congruent  Thought Process:  Goal Directed, Linear and Descriptions of Associations: Intact  Orientation:  Full (Time, Place, and Person)  Thought Content:  Rumination  Suicidal Thoughts:  No  Homicidal Thoughts:  No  Memory:  Immediate;   Good Recent;   Good Remote;   Good  Judgement:  Fair  Insight:  Fair  Psychomotor Activity:  Normal  Concentration:  Concentration: Good and Attention Span: Good  Recall:  Good  Fund of Knowledge:  Good  Language:  Good  Akathisia:  No  Handed:  Right  AIMS (if indicated):   N/A  Assets:  Communication Skills Desire for Improvement Housing Social Support  ADL's:  Intact  Cognition:  Impaired due to psychiatric condition.   Sleep:   N/A   Assessment:  [name] is 27 y.o. female who was admitted with SI. Patient has several similar past presentations and was recently admitted to [facility] inpatient August 2019. Patient main stressors is not wanting to live alone but rather seeking placement in a group home. Her [facility] team has been contacted regarding the situational trigger. However, per notes patient has changed her mind off an on regarding living alone versus in a group home.   Treatment Plan Summary: Plan Discharge home today 12/27/2017.   Disposition: No evidence of imminent risk to self or others at present.   Patient does not meet criteria for psychiatric inpatient admission. Supportive therapy provided about ongoing stressors. Discussed crisis plan, support from social network, calling 911, coming to the Emergency Department, and calling Suicide Hotline. Continue with [facility] ACT team   This service was provided via telemedicine using a 2-way, interactive audio and video technology.  Names of all persons participating in this telemedicine service and their role in this encounter. Name: Elmarie Shiley  Role: PMHNP-C   Name: [name] Role: Patient  Name:  Role:   Name:  Role:     Elmarie Shiley, NP 12/27/2017 11:04 AM

## 2017-12-27 NOTE — Progress Notes (Signed)
CSW received call from [facility] with [name] that pt has been cleared. [place] expressed that's he has called the crisis number and was informed that per ACT Team pt can ride the bus back home.  CSW to update RN and provide pt with bus pass to get back home.   [name] [name], MSW, [facility] Clinical Social Worker [phone]

## 2017-12-27 NOTE — Progress Notes (Addendum)
CSW consulted for reaching out to ACT Team. CSW spoke with Dianna from pt's ACT Team and was informed that pt has made all of the parties involved in pt's ACT Team that pt is interested in a Group Home. Dianna expressed that in pt doing this, pt also changes mind constantly being why they have not placed pt into a group home at this time.   CSW spoke with pt at bedside to update on care.CSW advised pt that depending upon what psych recommends would determine if and when pt would be being discharged. Pt appeared to be understanding and agreeable to plan at this time. CSW did inform pt that CSW pt would have to speak with ACT Team once more to express interest in [place] placement as CSW WOULD NOT be placing into a group home.  Dianna asked that if pt is discharged today that the Crisis Number for the ACT Team be called [phone].  Virgie Dad Furman Trentman, MSW, [facility] Clinical Social Worker [phone]

## 2017-12-31 ENCOUNTER — Emergency Department (HOSPITAL_COMMUNITY)
Admission: EM | Admit: 2017-12-31 | Discharge: 2017-12-31 | Disposition: A | Discharge status: Home / Self Care | Payer: Medicaid Other | Attending: Emergency Medicine | Admitting: Emergency Medicine

## 2017-12-31 ENCOUNTER — Other Ambulatory Visit: Payer: Self-pay

## 2017-12-31 DIAGNOSIS — R45851 Suicidal ideations: Secondary | ICD-10-CM

## 2017-12-31 DIAGNOSIS — F329 Major depressive disorder, single episode, unspecified: Secondary | ICD-10-CM | POA: Insufficient documentation

## 2017-12-31 LAB — RAPID URINE DRUG SCREEN, HOSP PERFORMED
Amphetamines: NOT DETECTED
Barbiturates: NOT DETECTED
Benzodiazepines: NOT DETECTED
Cocaine: NOT DETECTED
Opiates: NOT DETECTED
Tetrahydrocannabinol: NOT DETECTED

## 2017-12-31 LAB — CBG MONITORING, ED: Glucose-Capillary: 149 mg/dL — ABNORMAL HIGH (ref 70–99)

## 2017-12-31 LAB — COMPREHENSIVE METABOLIC PANEL
ALT: 18 U/L (ref 0–44)
ANION GAP: 11 (ref 5–15)
AST: 24 U/L (ref 15–41)
Albumin: 4.1 g/dL (ref 3.5–5.0)
Alkaline Phosphatase: 42 U/L (ref 38–126)
BUN: 7 mg/dL (ref 6–20)
CHLORIDE: 107 mmol/L (ref 98–111)
CO2: 23 mmol/L (ref 22–32)
CREATININE: 0.71 mg/dL (ref 0.44–1.00)
Calcium: 9.5 mg/dL (ref 8.9–10.3)
GFR calc Af Amer: >60 mL/min (ref >60)
GFR calc non Af Amer: >60 mL/min (ref >60)
Glucose, Bld: 137 mg/dL — ABNORMAL HIGH (ref 70–99)
POTASSIUM: 4.2 mmol/L (ref 3.5–5.1)
SODIUM: 141 mmol/L (ref 135–145)
Total Bilirubin: 0.5 mg/dL (ref 0.3–1.2)
Total Protein: 7.5 g/dL (ref 6.5–8.1)

## 2017-12-31 LAB — CBC
HCT: 41.9 % (ref 36.0–46.0)
HEMOGLOBIN: 13.8 g/dL (ref 12.0–15.0)
MCH: 26.8 pg (ref 26.0–34.0)
MCHC: 32.9 g/dL (ref 30.0–36.0)
MCV: 81.5 fL (ref 78.0–100.0)
Platelets: 243 10*3/uL (ref 150–400)
RBC: 5.14 MIL/uL — AB (ref 3.87–5.11)
RDW: 14 % (ref 11.5–15.5)
WBC: 9 10*3/uL (ref 4.0–10.5)

## 2017-12-31 LAB — I-STAT BETA HCG BLOOD, ED (MC, WL, AP ONLY)

## 2017-12-31 LAB — ACETAMINOPHEN LEVEL

## 2017-12-31 LAB — SALICYLATE LEVEL

## 2017-12-31 LAB — ETHANOL: Alcohol, Ethyl (B): <10 mg/dL (ref <10)

## 2017-12-31 MED ORDER — MELATONIN 3 MG PO TABS: 3.0000 mg | ORAL_TABLET | Freq: Every day | ORAL | Status: DC

## 2017-12-31 MED ORDER — INSULIN DETEMIR 100 UNIT/ML ~~LOC~~ SOLN
40.0000 [IU] | Freq: Two times a day (BID) | SUBCUTANEOUS | Status: DC
  Administered 2017-12-31: 40 [IU] via SUBCUTANEOUS
  Filled 2017-12-31 (×2): qty 0.4

## 2017-12-31 MED ORDER — LITHIUM CARBONATE ER 300 MG PO TBCR
300.0000 mg | EXTENDED_RELEASE_TABLET | Freq: Every day | ORAL | Status: DC
  Administered 2017-12-31: 300 mg via ORAL
  Filled 2017-12-31: qty 1

## 2017-12-31 MED ORDER — GLIPIZIDE 10 MG PO TABS: 10.0000 mg | ORAL_TABLET | Freq: Every day | ORAL | Status: DC

## 2017-12-31 MED ORDER — BUSPIRONE HCL 10 MG PO TABS
15.0000 mg | ORAL_TABLET | Freq: Three times a day (TID) | ORAL | Status: DC
  Administered 2017-12-31: 15 mg via ORAL
  Filled 2017-12-31: qty 2

## 2017-12-31 MED ORDER — NICOTINE POLACRILEX 2 MG MT GUM: 2.0000 mg | CHEWING_GUM | OROMUCOSAL | Status: DC | PRN

## 2017-12-31 NOTE — BH Assessment (Signed)
Assessment Note  [name] is an 27 y.o. female. presenting voluntarily to [facility] ED. Patient reviewed with clinician that she was experiencing SI with a plan to commit suicide by overdose. Patient has a recent history of multiple ED visits for suicidal ideation with threats to overdose, without acting on threats. She carries a prior diagnosis of Bipolar I and Schizophrenia. She stated that she is frustrated with her living situation because she lives alone and feels isolated. Patient complained that she is unable to take the city bus because people try to fight her and she is paranoid that bus driver is attempting to get other passengers to fight her. She would like to live in a group home so that she feels less isolated. She currently participates in ACTT services and shared that her team is "working" on finding her placement in a group home. Patient denied HI/ AVH. Patient denies any substance use. Patient stated she is taking her medications as prescribed and that her Lithium was recently increased.  Patient was alert and oriented x4 and was pleasant during assessment. Her mood was depressed and affect was congruent. She insight and judgement is impaired. Patient's ACTT is being contacted and informed that she is in the ED and to be picked up this afternoon.  Per [name], MD and Jinny Blossom, NP patient does not meet inpatient criteria. Patient's ACTT team is being notified of discharge.  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 Anxiety   . Bipolar 1 disorder ([place])   . Cognitive deficits   . Depression   . Diabetes mellitus without complication ([place])   . Hypertension   . Mental disorder   . Obesity     Past Surgical History:  Procedure Laterality Date  . CESAREAN SECTION    . CESAREAN SECTION N/A 04/25/2013   Procedure: REPEAT CESAREAN SECTION;  Surgeon: [name], MD;  Location: [facility];  Service: Obstetrics;  Laterality: N/A;  . MASS EXCISION N/A 06/03/2012   Procedure: EXCISION MASS;  Surgeon: [name], MD;  Location: [facility];  Service: ENT;  Laterality: N/A;  Excision uvula mass  . TONSILLECTOMY N/A 06/03/2012   Procedure: TONSILLECTOMY;  Surgeon: [name], MD;  Location: [facility];  Service: ENT;  Laterality: N/A;  . TONSILLECTOMY      Family History:  Family History  Problem Relation Age of Onset  . Hypertension Mother   . Diabetes Father     Social History:  reports that she has been smoking cigarettes. She has a 33.00 pack-year smoking history. She has never used smokeless tobacco. She reports that she does not drink alcohol or use drugs.  Additional Social History:  Alcohol / Drug Use Pain Medications: see MAR Prescriptions: see MAR Over the Counter: see MAR History of alcohol / drug use?: No history of alcohol / drug abuse  CIWA: CIWA-Ar BP: (!) 131/94 Pulse Rate: (!) 110 COWS:    Allergies:  Allergies  Allergen Reactions  . Wellbutrin [Bupropion] Shortness Of Breath  . Omnipaque [Iohexol] Swelling and Other (See Comments)    Reaction:  Eye swelling  . Penicillins Hives and Other (See Comments)    Has patient had a PCN reaction causing immediate rash, facial/tongue/throat swelling, SOB or lightheadedness with hypotension: No Has patient had a PCN reaction causing severe rash involving mucus membranes or skin necrosis: No Has patient had a PCN  reaction that required hospitalization No Has patient had a PCN reaction occurring within the last 10 years: No If all of the above answers are "NO", then may proceed with Cephalosporin use.  Herma Mering [Benztropine]     Make pt feel crazy  . Depakote Er [Divalproex Sodium Er] Nausea And Vomiting    Home Medications:  (Not in a hospital admission)  OB/GYN Status:  No LMP recorded. Patient has had an implant.  General Assessment  [facility] Location of Assessment: [facility] TTS Assessment: In system Is this a Tele or Face-to-Face Assessment?: Face-to-Face Is this an Initial Assessment or a Re-assessment for this encounter?: Initial Assessment Patient Accompanied by:: (self) Language Other than English: No Living Arrangements: Other (Comment)(apartment) What gender do you identify as?: Female Marital status: Single Pregnancy Status: No Living Arrangements: Alone Can pt return to current living arrangement?: Yes Admission Status: Voluntary Is patient capable of signing voluntary admission?: Yes Referral Source: Self/Family/Friend Insurance type: (Medicaid)     Crisis Care Plan Living Arrangements: Alone Name of Psychiatrist: [facility] team Name of Therapist: [facility] team  Education Status Is patient currently in school?: No Is the patient employed, unemployed or receiving disability?: Receiving disability income  Risk to self with the past 6 months Suicidal Ideation: Yes-Currently Present Has patient been a risk to self within the past 6 months prior to admission? : Yes Suicidal Intent: Yes-Currently Present Has patient had any suicidal intent within the past 6 months prior to admission? : Yes Is patient at risk for suicide?: Yes Suicidal Plan?: Yes-Currently Present(OD on pills) Has patient had any suicidal plan within the past 6 months prior to admission? : Yes Specify Current Suicidal Plan: (pt stated would OD on prescription meds) Access to Means: Yes Specify Access to Suicidal Means: (pt has access to medications) What has been your use of drugs/alcohol within the last 12 months?: denies Previous Attempts/Gestures: Yes How many times?: (multiple) Other Self Harm Risks: (hx of suicide attempts/ mental illnss) Triggers for Past Attempts: Unpredictable Intentional Self Injurious Behavior: None Family Suicide History: No Recent stressful life event(s): Other (Comment)(hx of depression/ unhappy with living  situation) Persecutory voices/beliefs?: No Depression: Yes Depression Symptoms: Despondent, Tearfulness, Feeling worthless/self pity, Loss of interest in usual pleasures, Isolating Substance abuse history and/or treatment for substance abuse?: No Suicide prevention information given to non-admitted patients: Not applicable  Risk to Others within the past 6 months Homicidal Ideation: No Does patient have any lifetime risk of violence toward others beyond the six months prior to admission? : No Thoughts of Harm to Others: No Current Homicidal Intent: No Current Homicidal Plan: No Access to Homicidal Means: No History of harm to others?: No Assessment of Violence: None Noted Does patient have access to weapons?: No Criminal Charges Pending?: No Does patient have a court date: No Is patient on probation?: No  Psychosis Hallucinations: None noted Delusions: None noted  Mental Status Report Appearance/Hygiene: Unremarkable, In scrubs Eye Contact: Good Motor Activity: Freedom of movement Speech: Logical/coherent Level of Consciousness: Alert Mood: Depressed Affect: Appropriate to circumstance, Sad Anxiety Level: None Thought Processes: Relevant, Coherent Judgement: Impaired Orientation: Person, Place, Time, Situation Obsessive Compulsive Thoughts/Behaviors: Minimal(reports shaving her head every other day due to OCD)  Cognitive Functioning Concentration: Normal Memory: Recent Intact, Remote Intact Is patient IDD: No Is IQ score available?: No Insight: Poor Impulse Control: Poor Appetite: Good Have you had any weight changes? : No Change Sleep: No Change Total Hours of Sleep: 8 Vegetative Symptoms: None  ADLScreening [facility] Assessment Services) Patient's cognitive ability adequate to safely complete daily activities?: Yes Patient able to express need for assistance with ADLs?: Yes Independently performs ADLs?: Yes (appropriate for developmental age)  Prior Inpatient  Therapy Prior Inpatient Therapy: Yes Prior Therapy Dates: 2019 and mult other admissions Prior Therapy Facilty/Provider(s): [facility], [facility] Reason for Treatment: SI, BIPOLAR  Prior Outpatient Therapy Prior Outpatient Therapy: Yes Prior Therapy Dates: CURRENT Prior Therapy Facilty/Provider(s): [facility] team Reason for Treatment: MED MGMT & THERAPY Does patient have an ACCT team?: Yes Does patient have Intensive In-House Services?  : No Does patient have [facility] services? : No Does patient have P4CC services?: No  ADL Screening (condition at time of admission) Patient's cognitive ability adequate to safely complete daily activities?: Yes Patient able to express need for assistance with ADLs?: Yes Independently performs ADLs?: Yes (appropriate for developmental age)       Abuse/Neglect Assessment (Assessment to be complete while patient is alone) Abuse/Neglect Assessment Can Be Completed: Yes Physical Abuse: Denies Verbal Abuse: Denies Sexual Abuse: Denies Exploitation of patient/patient's resources: Denies Self-Neglect: Denies Values / Beliefs Cultural Requests During Hospitalization: None Spiritual Requests During Hospitalization: None Consults Spiritual Care Consult Needed: No Social Work Consult Needed: No Regulatory affairs officer (For Healthcare) Does Patient Have a Medical Advance Directive?: No Would patient like information on creating a medical advance directive?: No - Patient declined          Disposition: Per [name], MD and Jinny Blossom, NP patient does not meet inpatient criteria. Patient's ACTT team is being notified of discharge.  Disposition Initial Assessment Completed for this Encounter: Yes  On Site Evaluation by:   Reviewed with Physician:    Orvis Brill 12/31/2017 10:18 AM

## 2017-12-31 NOTE — Progress Notes (Addendum)
Patient ID: [name], female   DOB: August 04, 1990, 27 y.o.   MRN: [id number]  Pt was seen and chart reviewed with treatment team and Dr Darleene Cleaver. Pt is well known to this emergency room system and has an ACT team with [facility]. Pt is chronically suicidal and appears to be at her baseline. Pt does not meet criteria for inpatient psychiatric admission. Pt is stable for discharge.  [name], NP-C 12-31-2017     1447 Patient seen face-to-face for psychiatric evaluation, chart reviewed and case discussed with the physician extender and developed treatment plan. Reviewed the information documented and agree with the treatment plan. [name], MD

## 2017-12-31 NOTE — ED Notes (Signed)
Pt changed into scrubs and belongings collected. $22 dollars cash collected.

## 2017-12-31 NOTE — ED Provider Notes (Signed)
[facility] DEPT Provider Note   CSN: [id number] Arrival date & time: 12/31/17  0745     History   Chief Complaint Chief Complaint  Patient presents with  . Suicidal    HPI [name] is a 27 y.o. female.  Patient states she wants to hurt herself..  She says she is suicidal  The history is provided by the patient. No language interpreter was used.  Altered Mental Status   This is a recurrent problem. The current episode started more than 2 days ago. The problem has not changed since onset.Pertinent negatives include no confusion, no seizures and no hallucinations. Risk factors: unknown. Her past medical history does not include seizures.    Past Medical History:  Diagnosis Date  . Anxiety   . Bipolar 1 disorder ([place])   . Cognitive deficits   . Depression   . Diabetes mellitus without complication ([place])   . Hypertension   . Mental disorder   . Obesity     Patient Active Problem List   Diagnosis Date Noted  . Schizophrenia, disorganized ([place]) 11/30/2017  . Moderate bipolar I disorder, most recent episode depressed ([employer])   . Psychosis ([place])   . 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 LGSIL on Pap smear of cervix 02/01/2017  . OCD (obsessive compulsive disorder) 10/05/2016  . Major depressive disorder, recurrent episode, mild ([place]) 05/04/2016  . 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 Learning disability 07/18/2015  . 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 Diabetes mellitus ([place]) 07/18/2015  . MDD (major depressive disorder), recurrent, severe, with psychosis ([place]) 07/18/2015  . Hyperlipidemia 07/18/2015  . Severe episode of recurrent major depressive disorder, without psychotic features ([place])   . Drug overdose   . Cognitive deficits 10/12/2012  . Generalized anxiety disorder 06/28/2012    Past Surgical History:  Procedure Laterality Date  . CESAREAN SECTION    . CESAREAN  SECTION N/A 04/25/2013   Procedure: REPEAT CESAREAN SECTION;  Surgeon: [name], MD;  Location: [facility];  Service: Obstetrics;  Laterality: N/A;  . MASS EXCISION N/A 06/03/2012   Procedure: EXCISION MASS;  Surgeon: [name], MD;  Location: [facility];  Service: ENT;  Laterality: N/A;  Excision uvula mass  . TONSILLECTOMY N/A 06/03/2012   Procedure: TONSILLECTOMY;  Surgeon: [name], MD;  Location: [facility];  Service: ENT;  Laterality: N/A;  . TONSILLECTOMY       OB History    Gravida  3   Para  3   Term  3   Preterm  0   AB  0   Living  3     SAB  0   TAB  0   Ectopic  0   Multiple  0   Live Births  3            Home Medications    Prior to Admission medications   Medication Sig Start Date End Date Taking? Authorizing Provider  ARIPiprazole ER (ABILIFY MAINTENA) 400 MG SRER injection Inject 400 mg into the muscle every 28 (twenty-eight) days.   Yes [provider]  busPIRone (BUSPAR) 15 MG tablet Take 1 tablet (15 mg total) by mouth 3 (three) times daily. 12/06/17  Yes [name], NP  glipiZIDE (GLUCOTROL) 10 MG tablet Take 10 mg by mouth daily before breakfast.   Yes [provider]  GNP MELATONIN 3 MG TABS Take 3 mg by mouth at bedtime.  12/22/17  Yes [provider]  insulin detemir (LEVEMIR) 100 UNIT/ML injection Inject 40 Units into the skin 2 (two) times daily.   Yes [provider]  lithium carbonate (LITHOBID) 300 MG CR tablet Take 300 mg by mouth daily. 12/22/17  Yes [provider]  nicotine polacrilex (NICORETTE) 2 MG gum Take 1 each (2 mg total) by mouth every hour as needed for smoking cessation. 12/06/17  Yes [name], NP  ARIPiprazole (ABILIFY) 20 MG tablet Take 1 tablet (20 mg total) by mouth daily. Patient not taking: Reported on 12/26/2017 12/07/17   [name], NP  benztropine (COGENTIN) 1 MG tablet Take 1 tablet (1 mg total) by  mouth 2 (two) times daily as needed for tremors (EPS). Patient not taking: Reported on 12/26/2017 12/06/17   [name], NP  Oxcarbazepine (TRILEPTAL) 300 MG tablet Take 1 tablet (300 mg total) by mouth 2 (two) times daily. Patient not taking: Reported on 12/26/2017 12/06/17   [name], NP    Family History Family History  Problem Relation Age of Onset  . Hypertension Mother   . Diabetes Father     Social History Social History   Tobacco Use  . Smoking status: Current Every Day Smoker    Packs/day: 3.00    Years: 11.00    Pack years: 33.00    Types: Cigarettes  . Smokeless tobacco: Never Used  Substance Use Topics  . Alcohol use: No  . Drug use: No    Comment: Patient denies     Allergies   Wellbutrin [bupropion]; Omnipaque [iohexol]; Penicillins; Cogentin [benztropine]; and Depakote er [divalproex sodium er]   Review of Systems Review of Systems  Constitutional: Negative for appetite change and fatigue.  HENT: Negative for congestion, ear discharge and sinus pressure.   Eyes: Negative for discharge.  Respiratory: Negative for cough.   Cardiovascular: Negative for chest pain.  Gastrointestinal: Negative for abdominal pain and diarrhea.  Genitourinary: Negative for frequency and hematuria.  Musculoskeletal: Negative for back pain.  Skin: Negative for rash.  Neurological: Negative for seizures and headaches.  Psychiatric/Behavioral: Positive for dysphoric mood. Negative for confusion and hallucinations.     Physical Exam Updated Vital Signs BP (!) 131/94 (BP Location: Right Arm)   Pulse (!) 110   Temp 98.3 F (36.8 C) (Oral)   Resp (!) 22   Wt 108.4 kg   SpO2 96%   BMI 37.43 kg/m   Physical Exam  Constitutional: She is oriented to person, place, and time. She appears well-developed.  HENT:  Head: Normocephalic.  Eyes: Conjunctivae and EOM are normal. No scleral icterus.  Neck: Neck supple. No thyromegaly present.  Cardiovascular:  Normal rate and regular rhythm. Exam reveals no gallop and no friction rub.  No murmur heard. Pulmonary/Chest: No stridor. She has no wheezes. She has no rales. She exhibits no tenderness.  Abdominal: She exhibits no distension. There is no tenderness. There is no rebound.  Musculoskeletal: Normal range of motion. She exhibits no edema.  Lymphadenopathy:    She has no cervical adenopathy.  Neurological: She is oriented to person, place, and time. She exhibits normal muscle tone. Coordination normal.  Skin: No rash noted. No erythema.  Psychiatric:  suicidal     ED Treatments / Results  Labs (all labs ordered are listed, but only abnormal results are displayed) Labs Reviewed  COMPREHENSIVE METABOLIC PANEL - Abnormal; Notable for the following components:      Result Value   Glucose, Bld  137 (*)    All other components within normal limits  ACETAMINOPHEN LEVEL - Abnormal; Notable for the following components:   Acetaminophen (Tylenol), Serum <10 (*)    All other components within normal limits  CBC - Abnormal; Notable for the following components:   RBC 5.14 (*)    All other components within normal limits  ETHANOL  SALICYLATE LEVEL  RAPID URINE DRUG SCREEN, HOSP PERFORMED  I-STAT BETA HCG BLOOD, ED ([facility], [facility], [facility] ONLY)    EKG None  Radiology No results found.  Procedures Procedures (including critical care time)  Medications Ordered in ED Medications - No data to display   Initial Impression / Assessment and Plan / ED Course  I have reviewed the triage vital signs and the nursing notes.  Pertinent labs & imaging results that were available during my care of the patient were reviewed by me and considered in my medical decision making (see chart for details).     Patient states she is suicidal.  Labs unremarkable.  Patient is medically cleared and will be seen by behavioral health  Final Clinical Impressions(s) / ED Diagnoses   Final diagnoses:  None    ED  Discharge Orders    None       Milton Ferguson, MD 12/31/17 [phone]

## 2017-12-31 NOTE — BH Assessment (Signed)
[facility] Assessment Progress Note  Per [name], MD, this pt does not require psychiatric hospitalization at this time.  Pt is to be discharged from [facility] by 14:00 with recommendation to continue treatment with the [facility].  This has been included in pt's discharge instructions.  At 11:04 I called [facility] and spoke to Maunie, informing him that pt is being discharged by 14:00, if they are able to come to [facility] to pick her up by that time.  Pt's nurse has been notified.  Jalene Mullet, [place] Triage Specialist [phone]

## 2017-12-31 NOTE — ED Triage Notes (Addendum)
Pt reports that she is having Suicidal thoughts today. Pt is very tearful in triage. Pt arrives with police. Pt has a long hx of SI and depression. Pt reports that she has been working with her ACT team and seeks placement in a group home. Pt has plan to overdose on her Buspar medication

## 2017-12-31 NOTE — Discharge Instructions (Signed)
For your behavioral health needs, you are advised to continue treatment with the [facility]: ° °     [facility] °     [facility], [address] °     [address] °     [place], [place]  [address] °     [phone] °     Crisis number: [phone] °

## 2017-12-31 NOTE — Progress Notes (Signed)
TTS Counselor left a voice mail for ACT team informing them they had a patient tot be discharged.

## 2018-01-02 ENCOUNTER — Emergency Department (HOSPITAL_COMMUNITY)
Admission: EM | Admit: 2018-01-02 | Discharge: 2018-01-02 | Disposition: A | Discharge status: Home / Self Care | Payer: Medicaid Other | Attending: Emergency Medicine | Admitting: Emergency Medicine

## 2018-01-02 DIAGNOSIS — F329 Major depressive disorder, single episode, unspecified: Secondary | ICD-10-CM | POA: Diagnosis present

## 2018-01-02 DIAGNOSIS — Z794 Long term (current) use of insulin: Secondary | ICD-10-CM | POA: Diagnosis not present

## 2018-01-02 DIAGNOSIS — R45851 Suicidal ideations: Secondary | ICD-10-CM | POA: Diagnosis not present

## 2018-01-02 DIAGNOSIS — F17210 Nicotine dependence, cigarettes, uncomplicated: Secondary | ICD-10-CM | POA: Diagnosis not present

## 2018-01-02 DIAGNOSIS — I10 Essential (primary) hypertension: Secondary | ICD-10-CM | POA: Insufficient documentation

## 2018-01-02 DIAGNOSIS — F3130 Bipolar disorder, current episode depressed, mild or moderate severity, unspecified: Secondary | ICD-10-CM | POA: Insufficient documentation

## 2018-01-02 DIAGNOSIS — Z79899 Other long term (current) drug therapy: Secondary | ICD-10-CM | POA: Insufficient documentation

## 2018-01-02 DIAGNOSIS — E119 Type 2 diabetes mellitus without complications: Secondary | ICD-10-CM | POA: Insufficient documentation

## 2018-01-02 DIAGNOSIS — F4325 Adjustment disorder with mixed disturbance of emotions and conduct: Secondary | ICD-10-CM | POA: Insufficient documentation

## 2018-01-02 DIAGNOSIS — F4329 Adjustment disorder with other symptoms: Secondary | ICD-10-CM | POA: Diagnosis present

## 2018-01-02 LAB — ETHANOL: Alcohol, Ethyl (B): <10 mg/dL (ref <10)

## 2018-01-02 LAB — COMPREHENSIVE METABOLIC PANEL
ALBUMIN: 4.2 g/dL (ref 3.5–5.0)
ALT: 18 U/L (ref 0–44)
AST: 23 U/L (ref 15–41)
Alkaline Phosphatase: 40 U/L (ref 38–126)
Anion gap: 9 (ref 5–15)
BILIRUBIN TOTAL: 0.9 mg/dL (ref 0.3–1.2)
BUN: 9 mg/dL (ref 6–20)
CO2: 26 mmol/L (ref 22–32)
Calcium: 9.4 mg/dL (ref 8.9–10.3)
Chloride: 107 mmol/L (ref 98–111)
Creatinine, Ser: 0.73 mg/dL (ref 0.44–1.00)
GFR calc Af Amer: >60 mL/min (ref >60)
GFR calc non Af Amer: >60 mL/min (ref >60)
GLUCOSE: 188 mg/dL — AB (ref 70–99)
Potassium: 3.7 mmol/L (ref 3.5–5.1)
SODIUM: 142 mmol/L (ref 135–145)
TOTAL PROTEIN: 7.3 g/dL (ref 6.5–8.1)

## 2018-01-02 LAB — CBC
HEMATOCRIT: 40.3 % (ref 36.0–46.0)
Hemoglobin: 13.3 g/dL (ref 12.0–15.0)
MCH: 26.9 pg (ref 26.0–34.0)
MCHC: 33 g/dL (ref 30.0–36.0)
MCV: 81.4 fL (ref 78.0–100.0)
PLATELETS: 233 10*3/uL (ref 150–400)
RBC: 4.95 MIL/uL (ref 3.87–5.11)
RDW: 14.1 % (ref 11.5–15.5)
WBC: 8 10*3/uL (ref 4.0–10.5)

## 2018-01-02 LAB — ACETAMINOPHEN LEVEL

## 2018-01-02 LAB — RAPID URINE DRUG SCREEN, HOSP PERFORMED
Amphetamines: NOT DETECTED
BENZODIAZEPINES: NOT DETECTED
Barbiturates: NOT DETECTED
COCAINE: NOT DETECTED
Opiates: NOT DETECTED
Tetrahydrocannabinol: NOT DETECTED

## 2018-01-02 LAB — I-STAT BETA HCG BLOOD, ED (MC, WL, AP ONLY): I-stat hCG, quantitative: <5 m[IU]/mL (ref <5)

## 2018-01-02 LAB — SALICYLATE LEVEL: Salicylate Lvl: <7 mg/dL (ref 2.8–30.0)

## 2018-01-02 NOTE — ED Notes (Signed)
Pt belongings: Shirt, bra, jeans, shoes, small backpack Place in cabinet in front of room 17

## 2018-01-02 NOTE — ED Notes (Signed)
Provider at bedside

## 2018-01-02 NOTE — ED Notes (Signed)
Pt given back 1 personal belongings bag and her backpack/purse prior to leaving.

## 2018-01-02 NOTE — ED Notes (Signed)
Bed: [facility] Expected date:  Expected time:  Means of arrival:  Comments: 

## 2018-01-02 NOTE — ED Notes (Signed)
Per [facility] PD , pt took 1 lithium and 5 buspar at about [phone]

## 2018-01-02 NOTE — ED Provider Notes (Signed)
[facility] DEPT Provider Note   CSN: [id number] Arrival date & time: 01/02/18  3785     History   Chief Complaint Chief Complaint  Patient presents with  . Suicidal    HPI [name] is a 27 y.o. female.  27 y/o female with a PMH of DM,HTN,Bipolar 1 and depression presents to the ED with a chief complaint of suicidal ideation. Patient states "I hate living along, my ACT team is not taking it serious, I want to kill myself". Patient reports she has a plan to overdose with buspar and 1 of lithium. She also reports her medication isnt working as she feels angry, depression, crying daily. She also reports some generalized abdominal pain but denies any vomiting, nausea or diarrhea. She denies any hallucinations, HI or other complaints.     Past Medical History:  Diagnosis Date  . Anxiety   . Bipolar 1 disorder ([place])   . Cognitive deficits   . Depression   . Diabetes mellitus without complication ([place])   . Hypertension   . Mental disorder   . Obesity     Patient Active Problem List   Diagnosis Date Noted  . Adjustment disorder with emotional disturbance 01/02/2018  . 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 Moderate bipolar I disorder, most recent episode depressed ([place])   . Psychosis ([place])   . 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 LGSIL on Pap smear of cervix 02/01/2017  . OCD (obsessive compulsive disorder) 10/05/2016  . Major depressive disorder, recurrent episode, mild ([place]) 05/04/2016  . 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 Learning disability 07/18/2015  . 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 Diabetes mellitus ([place]) 07/18/2015  . 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 Hyperlipidemia 07/18/2015  . Severe episode of recurrent major depressive disorder, without psychotic features ([place])   . Drug overdose   . Cognitive  deficits 10/12/2012  . Generalized anxiety disorder 06/28/2012    Past Surgical History:  Procedure Laterality Date  . CESAREAN SECTION    . CESAREAN SECTION N/A 04/25/2013   Procedure: REPEAT CESAREAN SECTION;  Surgeon: [name], MD;  Location: [facility];  Service: Obstetrics;  Laterality: N/A;  . MASS EXCISION N/A 06/03/2012   Procedure: EXCISION MASS;  Surgeon: [name], MD;  Location: [facility];  Service: ENT;  Laterality: N/A;  Excision uvula mass  . TONSILLECTOMY N/A 06/03/2012   Procedure: TONSILLECTOMY;  Surgeon: [name], MD;  Location: [facility];  Service: ENT;  Laterality: N/A;  . TONSILLECTOMY       OB History    Gravida  3   Para  3   Term  3   Preterm  0   AB  0   Living  3     SAB  0   TAB  0   Ectopic  0   Multiple  0   Live Births  3            Home Medications    Prior to Admission medications   Medication Sig Start Date End Date Taking? Authorizing Provider  busPIRone (BUSPAR) 15 MG tablet Take 1 tablet (15 mg total) by mouth 3 (three) times daily. Patient taking differently: Take 15 mg by mouth 2 (two) times daily.  12/06/17  Yes [name], NP  lithium carbonate (LITHOBID) 300 MG CR tablet Take 300 mg by mouth 2 (two) times daily.  12/22/17  Yes [provider]  ABILIFY MAINTENA 400 MG PRSY prefilled syringe Inject 400 mg as directed every 30 (thirty) days. 01/04/18   [provider]  glipiZIDE (GLUCOTROL) 10 MG tablet Take 10 mg by mouth daily before breakfast.    [provider]  GNP MELATONIN 3 MG TABS Take 3 mg by mouth at bedtime.  12/22/17   [provider]  insulin detemir (LEVEMIR) 100 UNIT/ML injection Inject 40 Units into the skin 2 (two) times daily.    [provider]  nicotine polacrilex (NICORETTE) 2 MG gum Take 1 each (2 mg total) by mouth every hour as needed for smoking cessation. 12/06/17   [name], NP     Family History Family History  Problem Relation Age of Onset  . Hypertension Mother   . Diabetes Father     Social History Social History   Tobacco Use  . Smoking status: Current Every Day Smoker    Packs/day: 3.00    Years: 11.00    Pack years: 33.00    Types: Cigarettes  . Smokeless tobacco: Never Used  Substance Use Topics  . Alcohol use: No  . Drug use: No    Comment: Patient denies     Allergies   Wellbutrin [bupropion]; Omnipaque [iohexol]; Penicillins; Cogentin [benztropine]; and Depakote er [divalproex sodium er]   Review of Systems Review of Systems  Constitutional: Negative for chills and fever.  HENT: Negative for ear pain and sore throat.   Eyes: Negative for pain and visual disturbance.  Respiratory: Negative for cough and shortness of breath.   Cardiovascular: Negative for chest pain and palpitations.  Gastrointestinal: Positive for abdominal pain. Negative for diarrhea, nausea and vomiting.  Genitourinary: Negative for dysuria and hematuria.  Musculoskeletal: Negative for arthralgias and back pain.  Skin: Negative for color change and rash.  Neurological: Negative for seizures and syncope.  Psychiatric/Behavioral: Positive for dysphoric mood.  All other systems reviewed and are negative.    Physical Exam Updated Vital Signs BP 131/60 (BP Location: Left Arm)   Pulse 92   Temp 98.3 F (36.8 C) (Oral)   Resp 18   SpO2 100%   Physical Exam  Constitutional: She is oriented to person, place, and time. She appears well-developed and well-nourished.  HENT:  Head: Normocephalic and atraumatic.  Eyes: Pupils are equal, round, and reactive to light.  Neck: Normal range of motion. Neck supple.  Cardiovascular: Normal heart sounds.  Pulmonary/Chest: Breath sounds normal.  Abdominal: Soft. Bowel sounds are normal. There is no tenderness.  States she has generalized tenderness during patient interview however physical exam shows no tenderness to  any isolated region.  Will obtain basic lab work prior to psychiatric evaluation.  Musculoskeletal: She exhibits no tenderness or deformity.  Neurological: She is alert and oriented to person, place, and time.  Skin: Skin is warm and dry.  Nursing note and vitals reviewed.    ED Treatments / Results  Labs (all labs ordered are listed, but only abnormal results are displayed) Labs Reviewed  COMPREHENSIVE METABOLIC PANEL - Abnormal; Notable for the following components:      Result Value   Glucose, Bld 188 (*)    All other components within normal limits  ACETAMINOPHEN LEVEL - Abnormal; Notable for the following components:   Acetaminophen (Tylenol), Serum <10 (*)    All other components within normal limits  ETHANOL  SALICYLATE LEVEL  CBC  RAPID URINE DRUG SCREEN, HOSP PERFORMED  I-STAT BETA HCG BLOOD,  ED ([facility], [facility], [facility] ONLY)    EKG None  Radiology No results found.  Procedures Procedures (including critical care time)  Medications Ordered in ED Medications - No data to display   Initial Impression / Assessment and Plan / ED Course  I have reviewed the triage vital signs and the nursing notes.  Pertinent labs & imaging results that were available during my care of the patient were reviewed by me and considered in my medical decision making (see chart for details).    Patient presents with suicidal ideation.  She states she cannot live alone and her ACT team is not taking her seriously.  Patient states she has a plan to kill herself with lithium and BuSpar.  Patient "I want to kill myself "during patient interview.  While in the ED acetaminophen level, salicylate level within normal range.  UDS performed no opiates, benzos, barbiturates detected.  CBC showed no leukocytosis within normal range.  CMP showed no electrolyte abnormality glucose 188.  During examination patient reports abdominal pain generalized.  She denies any urinary symptoms, gynecological symptoms, diarrhea,  nausea, vomiting.  Patient states that she is not being taken seriously by her ACT team, she is medically clear for gastric evaluation at this time.   Final Clinical Impressions(s) / ED Diagnoses   Final diagnoses:  Adjustment disorder with emotional disturbance  Suicidal ideation    ED Discharge Orders         Ordered    Increase activity slowly     01/02/18 0915    Diet - low sodium heart healthy     01/02/18 0915    Discharge instructions    Comments:  Follow up with ACT team   01/02/18 0915           Janeece Fitting, PA-C 01/05/18 1211    Gareth Morgan, MD 01/06/18 [id number]

## 2018-01-02 NOTE — BH Assessment (Signed)
Assessment Note  [name] is an 27 y.o. female who presented at [facility] ED stating that she was suicidal and that she tried to kill herself by taking 3 extra Buspar over what was prescribed totalling 5 pills. She was just seen by Dr. Darleene Cleaver yesterday in the ED and was discharged. Patient states that she is stressed out because she is living alone in an apartment and states that she does not like it.  She states that she is also having problems getting along with her mother.  Patient states: "I want to go to a group home and no one is listening to me."  Patient denied HI/Pschosis and SA Use.  Patient presented as alert and oriented.  Her memory was intact, her thoughts organized and her speech was clear and coherent.  She did not appear to be responding to internal stimuli.  Her mood was depressed and her affect was flat.  She has characteristic poor judgment and insight.  Diagnosis: F31.30 Bipolar Depressed Mood  Past Medical History:  Past Medical History:  Diagnosis Date  . Anxiety   . Bipolar 1 disorder ([place])   . Cognitive deficits   . Depression   . Diabetes mellitus without complication ([place])   . Hypertension   . Mental disorder   . Obesity     Past Surgical History:  Procedure Laterality Date  . CESAREAN SECTION    . CESAREAN SECTION N/A 04/25/2013   Procedure: REPEAT CESAREAN SECTION;  Surgeon: [name], MD;  Location: [facility];  Service: Obstetrics;  Laterality: N/A;  . MASS EXCISION N/A 06/03/2012   Procedure: EXCISION MASS;  Surgeon: [name], MD;  Location: [facility];  Service: ENT;  Laterality: N/A;  Excision uvula mass  . TONSILLECTOMY N/A 06/03/2012   Procedure: TONSILLECTOMY;  Surgeon: [name], MD;  Location: [facility];  Service: ENT;  Laterality: N/A;  . TONSILLECTOMY      Family History:  Family History  Problem Relation Age of Onset  . Hypertension Mother   . Diabetes Father     Social History:   reports that she has been smoking cigarettes. She has a 33.00 pack-year smoking history. She has never used smokeless tobacco. She reports that she does not drink alcohol or use drugs.  Additional Social History:  Alcohol / Drug Use Pain Medications: denies Prescriptions: denies Over the Counter: denies History of alcohol / drug use?: No history of alcohol / drug abuse  CIWA: CIWA-Ar BP: 131/60 Pulse Rate: 92 COWS:    Allergies:  Allergies  Allergen Reactions  . Wellbutrin [Bupropion] Shortness Of Breath  . Omnipaque [Iohexol] Swelling and Other (See Comments)    Reaction:  Eye swelling  . Penicillins Hives and Other (See Comments)    Has patient had a PCN reaction causing immediate rash, facial/tongue/throat swelling, SOB or lightheadedness with hypotension: No Has patient had a PCN reaction causing severe rash involving mucus membranes or skin necrosis: No Has patient had a PCN reaction that required hospitalization No Has patient had a PCN reaction occurring within the last 10 years: No If all of the above answers are "NO", then may proceed with Cephalosporin use.  Herma Mering [Benztropine]     Make pt feel crazy  . Depakote Er [Divalproex Sodium Er] Nausea And Vomiting    Home Medications:  (Not in a hospital admission)  OB/GYN Status:  No LMP recorded. Patient has had an implant.  General Assessment Data [facility]: [facility]  ED TTS Assessment: In system Is this a Tele or Face-to-Face Assessment?: Face-to-Face Is this an Initial Assessment or a Re-assessment for this encounter?: Initial Assessment Patient Accompanied by:: N/A Language Other than English: No Living Arrangements: Other (Comment)(has own apartment) What gender do you identify as?: Female Marital status: Single Maiden name: Fazekas Pregnancy Status: No Living Arrangements: Alone Can pt return to current living arrangement?: Yes Admission Status: Voluntary Is patient capable of signing voluntary  admission?: Yes Referral Source: Self/Family/Friend Insurance type: (Medicaid)     Crisis Care Plan Living Arrangements: Alone Legal Guardian: Other:(self) Name of Psychiatrist: ([facility] Team) Name of Therapist: ([facility] ACTT Team)  Education Status Is patient currently in school?: No Is the patient employed, unemployed or receiving disability?: Receiving disability income  Risk to self with the past 6 months Suicidal Ideation: Yes-Currently Present Has patient been a risk to self within the past 6 months prior to admission? : Yes Suicidal Intent: No(pt took 3 extra Buspar) Has patient had any suicidal intent within the past 6 months prior to admission? : Yes Is patient at risk for suicide?: Yes Suicidal Plan?: Yes-Currently Present Has patient had any suicidal plan within the past 6 months prior to admission? : Yes Specify Current Suicidal Plan: (tooke 3 extra Buspar over what was prescribed) Access to Means: Yes Specify Access to Suicidal Means: (Rx Drugs) What has been your use of drugs/alcohol within the last 12 months?: (none) Previous Attempts/Gestures: Yes How many times?: (multiple) Other Self Harm Risks: (none) Triggers for Past Attempts: Unpredictable Intentional Self Injurious Behavior: None Family Suicide History: No Recent stressful life event(s): (unhappy with living situation, wants to go to a broup home) Persecutory voices/beliefs?: No Depression: Yes Depression Symptoms: Despondent, Isolating, Loss of interest in usual pleasures, Feeling worthless/self pity Substance abuse history and/or treatment for substance abuse?: No Suicide prevention information given to non-admitted patients: Yes  Risk to Others within the past 6 months Homicidal Ideation: No Does patient have any lifetime risk of violence toward others beyond the six months prior to admission? : No Thoughts of Harm to Others: No Current Homicidal Intent: No Current Homicidal Plan: No Access to  Homicidal Means: No Identified Victim: none History of harm to others?: No Assessment of Violence: None Noted Violent Behavior Description: none Does patient have access to weapons?: No Criminal Charges Pending?: No Does patient have a court date: No Is patient on probation?: No  Psychosis Hallucinations: None noted Delusions: None noted  Mental Status Report Appearance/Hygiene: In scrubs(head is shaved) Eye Contact: Good Motor Activity: Freedom of movement Speech: Logical/coherent Level of Consciousness: Alert Mood: Depressed, Anxious Affect: Appropriate to circumstance Anxiety Level: None Thought Processes: Coherent, Relevant Judgement: Impaired Orientation: Person, Place, Time, Situation Obsessive Compulsive Thoughts/Behaviors: Minimal  Cognitive Functioning Concentration: Normal Memory: Recent Intact, Remote Intact Is patient IDD: No Is IQ score available?: No Insight: Poor Impulse Control: Poor Appetite: Good Have you had any weight changes? : No Change Sleep: No Change Total Hours of Sleep: (8) Vegetative Symptoms: None  ADLScreening [facility] Assessment Services) Patient's cognitive ability adequate to safely complete daily activities?: Yes Patient able to express need for assistance with ADLs?: Yes Independently performs ADLs?: Yes (appropriate for developmental age)  Prior Inpatient Therapy Prior Inpatient Therapy: Yes Prior Therapy Dates: (2019 and hx of multiple admissions) Prior Therapy Facilty/Provider(s): ([facility], [facility] and [facility])  Prior Outpatient Therapy Prior Outpatient Therapy: Yes Prior Therapy Dates: (active) Prior Therapy Facilty/Provider(s): [facility] Reason for Treatment: (Medication management)  Does patient have an ACCT team?: Yes Does patient have Intensive In-House Services?  : No Does patient have [facility] services? : No Does patient have P4CC services?: No  ADL Screening (condition at time of  admission) Patient's cognitive ability adequate to safely complete daily activities?: Yes Is the patient deaf or have difficulty hearing?: No Does the patient have difficulty seeing, even when wearing glasses/contacts?: No Does the patient have difficulty concentrating, remembering, or making decisions?: No Patient able to express need for assistance with ADLs?: Yes Does the patient have difficulty dressing or bathing?: No Independently performs ADLs?: Yes (appropriate for developmental age) Does the patient have difficulty walking or climbing stairs?: No Weakness of Legs: None Weakness of Arms/Hands: None     Therapy Consults (therapy consults require a physician order) PT Evaluation Needed: No OT Evalulation Needed: No SLP Evaluation Needed: No Abuse/Neglect Assessment (Assessment to be complete while patient is alone) Abuse/Neglect Assessment Can Be Completed: Yes Physical Abuse: Denies Verbal Abuse: Denies Sexual Abuse: Denies Exploitation of patient/patient's resources: Denies Self-Neglect: Denies Values / Beliefs Cultural Requests During Hospitalization: None Spiritual Requests During Hospitalization: None Consults Spiritual Care Consult Needed: No Social Work Consult Needed: No Regulatory affairs officer (For Healthcare) Does Patient Have a Medical Advance Directive?: No Would patient like information on creating a medical advance directive?: No - Patient declined Nutrition Screen- [facility] Has the patient recently lost weight without trying?: No Has the patient been eating poorly because of a decreased appetite?: No Malnutrition Screening Tool Score: 0        Disposition: Per Waylan Boga, NP, patient can be discharged to follow-up with her ACTT Team. Disposition Initial Assessment Completed for this Encounter: Yes Disposition of Patient: Discharge Patient refused recommended treatment: No Mode of transportation if patient is discharged?: Car Patient referred  to: ([facility] Team)  On Site Evaluation by:   Reviewed with Physician:    Judeth Porch Briauna [place]in 01/02/2018 9:41 AM

## 2018-01-02 NOTE — Progress Notes (Signed)
CSW attempted to reach patient's ACT Team, [facility] [phone]). Left a HIPPA compliant voicemail requesting returned call.   CSW will attempt to reach ACT Team at a later time.  Stephanie Acre, [place] Social Worker [phone]

## 2018-01-02 NOTE — ED Triage Notes (Signed)
-  Patient arrived via [place] -Hx of depression/anxiety/SI -took 1 lithium as prescribed. Then took 5 buspar, standard dosage is 1 litium, 2 buspar, took these @ 6:50 am  Vitals -136/92 -HR 100 -RR 16  -99% RA -CBG 219 (DM1, did not take insulin yesterday, patient stated she was "too tired")  -Patient is ambulatory

## 2018-01-05 ENCOUNTER — Emergency Department (HOSPITAL_COMMUNITY)
Admission: EM | Admit: 2018-01-05 | Discharge: 2018-01-05 | Disposition: A | Discharge status: Home / Self Care | Payer: Medicaid Other | Attending: Emergency Medicine | Admitting: Emergency Medicine

## 2018-01-05 ENCOUNTER — Encounter (HOSPITAL_COMMUNITY): Payer: Self-pay | Admitting: Emergency Medicine

## 2018-01-05 DIAGNOSIS — Z794 Long term (current) use of insulin: Secondary | ICD-10-CM | POA: Diagnosis not present

## 2018-01-05 DIAGNOSIS — F319 Bipolar disorder, unspecified: Secondary | ICD-10-CM | POA: Diagnosis not present

## 2018-01-05 DIAGNOSIS — E119 Type 2 diabetes mellitus without complications: Secondary | ICD-10-CM | POA: Diagnosis not present

## 2018-01-05 DIAGNOSIS — F22 Delusional disorders: Secondary | ICD-10-CM | POA: Diagnosis not present

## 2018-01-05 DIAGNOSIS — R45851 Suicidal ideations: Secondary | ICD-10-CM | POA: Insufficient documentation

## 2018-01-05 DIAGNOSIS — F17210 Nicotine dependence, cigarettes, uncomplicated: Secondary | ICD-10-CM | POA: Diagnosis not present

## 2018-01-05 DIAGNOSIS — Z79899 Other long term (current) drug therapy: Secondary | ICD-10-CM | POA: Diagnosis not present

## 2018-01-05 LAB — COMPREHENSIVE METABOLIC PANEL
ALK PHOS: 39 U/L (ref 38–126)
ALT: 16 U/L (ref 0–44)
AST: 23 U/L (ref 15–41)
Albumin: 3.6 g/dL (ref 3.5–5.0)
Anion gap: 12 (ref 5–15)
BUN: 7 mg/dL (ref 6–20)
CALCIUM: 9.3 mg/dL (ref 8.9–10.3)
CO2: 26 mmol/L (ref 22–32)
CREATININE: 0.74 mg/dL (ref 0.44–1.00)
Chloride: 105 mmol/L (ref 98–111)
GFR calc non Af Amer: >60 mL/min (ref >60)
Glucose, Bld: 154 mg/dL — ABNORMAL HIGH (ref 70–99)
Potassium: 3.4 mmol/L — ABNORMAL LOW (ref 3.5–5.1)
SODIUM: 143 mmol/L (ref 135–145)
Total Bilirubin: 0.6 mg/dL (ref 0.3–1.2)
Total Protein: 6.5 g/dL (ref 6.5–8.1)

## 2018-01-05 LAB — ACETAMINOPHEN LEVEL: Acetaminophen (Tylenol), Serum: <10 ug/mL — ABNORMAL LOW (ref 10–30)

## 2018-01-05 LAB — CBC
HEMATOCRIT: 39.8 % (ref 36.0–46.0)
HEMOGLOBIN: 12.6 g/dL (ref 12.0–15.0)
MCH: 26.4 pg (ref 26.0–34.0)
MCHC: 31.7 g/dL (ref 30.0–36.0)
MCV: 83.3 fL (ref 78.0–100.0)
Platelets: 205 10*3/uL (ref 150–400)
RBC: 4.78 MIL/uL (ref 3.87–5.11)
RDW: 13.6 % (ref 11.5–15.5)
WBC: 8 10*3/uL (ref 4.0–10.5)

## 2018-01-05 LAB — I-STAT BETA HCG BLOOD, ED (MC, WL, AP ONLY)

## 2018-01-05 LAB — SALICYLATE LEVEL

## 2018-01-05 LAB — CBG MONITORING, ED: GLUCOSE-CAPILLARY: 110 mg/dL — AB (ref 70–99)

## 2018-01-05 LAB — ETHANOL: Alcohol, Ethyl (B): <10 mg/dL (ref <10)

## 2018-01-05 MED ORDER — BUSPIRONE HCL 10 MG PO TABS: 15.0000 mg | ORAL_TABLET | Freq: Three times a day (TID) | ORAL | Status: DC

## 2018-01-05 MED ORDER — ZIPRASIDONE MESYLATE 20 MG IM SOLR
20.0000 mg | Freq: Once | INTRAMUSCULAR | Status: AC
  Administered 2018-01-05: 20 mg via INTRAMUSCULAR
  Filled 2018-01-05: qty 20

## 2018-01-05 MED ORDER — GLIPIZIDE 10 MG PO TABS: 10.0000 mg | ORAL_TABLET | Freq: Every day | ORAL | Status: DC

## 2018-01-05 MED ORDER — NICOTINE 21 MG/24HR TD PT24: 21.0000 mg | MEDICATED_PATCH | Freq: Every day | TRANSDERMAL | Status: DC

## 2018-01-05 MED ORDER — GLIPIZIDE 10 MG PO TABS
10.0000 mg | ORAL_TABLET | Freq: Two times a day (BID) | ORAL | Status: DC
  Filled 2018-01-05: qty 1

## 2018-01-05 MED ORDER — LITHIUM CARBONATE ER 300 MG PO TBCR
300.0000 mg | EXTENDED_RELEASE_TABLET | Freq: Two times a day (BID) | ORAL | Status: DC
  Filled 2018-01-05: qty 1

## 2018-01-05 MED ORDER — ONDANSETRON HCL 4 MG PO TABS: 4.0000 mg | ORAL_TABLET | Freq: Three times a day (TID) | ORAL | Status: DC | PRN

## 2018-01-05 MED ORDER — ACETAMINOPHEN 325 MG PO TABS: 650.0000 mg | ORAL_TABLET | ORAL | Status: DC | PRN

## 2018-01-05 NOTE — ED Notes (Signed)
Pt refusing blood draw at this time.

## 2018-01-05 NOTE — BH Assessment (Signed)
Tele Assessment Note   Patient Name: [name] MRN: [id number] Referring Physician: Rogene Houston Location of Patient: [facility] Location of Provider: [place] is a 27 y.o. female, in [facility] due to [place] stemming from pt wanting to go to a group home and being depressed, lonely and impatient. This is pt's 33rd time in the ED w/in the past 6 months for very similar, if not the same, complaints. Pt has an ACTT team with [facility] who are working on getting pt into a group home, but pt says, "they're taking too long".  Clinician called [facility] [phone]) and spoke to [place] who shared that pt is on the schedule to meet with the housing specialist today. She reports that they have been working with pt on getting into a group home but availability is limited.   Case staffed with [name], NP, who also spoke with pt. Pt is recommended to be d/c and to f/u with her ACTT team.   Diagnosis: Bipolar I  Past Medical History:  Past Medical History:  Diagnosis Date  . Anxiety   . Bipolar 1 disorder ([place])   . Cognitive deficits   . Depression   . Diabetes mellitus without complication ([place])   . Hypertension   . Mental disorder   . Obesity     Past Surgical History:  Procedure Laterality Date  . CESAREAN SECTION    . CESAREAN SECTION N/A 04/25/2013   Procedure: REPEAT CESAREAN SECTION;  Surgeon: [name], MD;  Location: [facility];  Service: Obstetrics;  Laterality: N/A;  . MASS EXCISION N/A 06/03/2012   Procedure: EXCISION MASS;  Surgeon: [name], MD;  Location: [facility];  Service: ENT;  Laterality: N/A;  Excision uvula mass  . TONSILLECTOMY N/A 06/03/2012   Procedure: TONSILLECTOMY;  Surgeon: [name], MD;  Location: [place];  Service: ENT;  Laterality: N/A;  . TONSILLECTOMY      Family History:  Family History  Problem Relation Age of Onset  . Hypertension Mother   . Diabetes Father     Social History:   reports that she has been smoking cigarettes. She has a 33.00 pack-year smoking history. She has never used smokeless tobacco. She reports that she does not drink alcohol or use drugs.  Additional Social History:  Alcohol / Drug Use Pain Medications: see MAR Prescriptions: see MAR Over the Counter: see MAR History of alcohol / drug use?: No history of alcohol / drug abuse  CIWA: CIWA-Ar BP: (!) 132/91 Pulse Rate: (!) 109 COWS:    Allergies:  Allergies  Allergen Reactions  . Wellbutrin [Bupropion] Shortness Of Breath  . Omnipaque [Iohexol] Swelling and Other (See Comments)    Reaction:  Eye swelling  . Penicillins Hives and Other (See Comments)    Has patient had a PCN reaction causing immediate rash, facial/tongue/throat swelling, SOB or lightheadedness with hypotension: No Has patient had a PCN reaction causing severe rash involving mucus membranes or skin necrosis: No Has patient had a PCN reaction that required hospitalization No Has patient had a PCN reaction occurring within the last 10 years: No If all of the above answers are "NO", then may proceed with Cephalosporin use.  Herma Mering [Benztropine]     Make pt feel crazy  . Depakote Er [Divalproex Sodium Er] Nausea And Vomiting    Home Medications:  (Not in a hospital admission)  OB/GYN Status:  No LMP recorded. Patient has had an implant.  General  Assessment Data Location of Assessment: [facility] ED TTS Assessment: In system Is this a Tele or Face-to-Face Assessment?: Tele Assessment Is this an Initial Assessment or a Re-assessment for this encounter?: Initial Assessment Patient Accompanied by:: N/A Language Other than English: No Living Arrangements: Other (Comment) What gender do you identify as?: Female Marital status: Single Maiden name: n/a Pregnancy Status: No Living Arrangements: Alone Can pt return to current living arrangement?: Yes Admission Status: Voluntary Is patient capable of signing voluntary  admission?: Yes Referral Source: Self/Family/Friend     Crisis Care Plan Living Arrangements: Alone Name of Psychiatrist: [facility] ACTT team Name of Therapist: [facility] ACTT team  Education Status Is patient currently in school?: No Highest grade of school patient has completed: 11th grade Is the patient employed, unemployed or receiving disability?: Receiving disability income  Risk to self with the past 6 months Suicidal Ideation: Yes-Currently Present Has patient been a risk to self within the past 6 months prior to admission? : Yes Suicidal Intent: No Has patient had any suicidal intent within the past 6 months prior to admission? : No Is patient at risk for suicide?: No Suicidal Plan?: Yes-Currently Present Has patient had any suicidal plan within the past 6 months prior to admission? : Yes Specify Current Suicidal Plan: od on meds Access to Means: Yes Specify Access to Suicidal Means: rx meds Previous Attempts/Gestures: Yes Triggers for Past Attempts: Unpredictable Intentional Self Injurious Behavior: None Family Suicide History: No Recent stressful life event(s): Other (Comment) Persecutory voices/beliefs?: No Depression: Yes Depression Symptoms: Tearfulness, Feeling worthless/self pity, Feeling angry/irritable Substance abuse history and/or treatment for substance abuse?: No Suicide prevention information given to non-admitted patients: Yes  Risk to Others within the past 6 months Homicidal Ideation: No Does patient have any lifetime risk of violence toward others beyond the six months prior to admission? : No Thoughts of Harm to Others: No Current Homicidal Intent: No Current Homicidal Plan: No Access to Homicidal Means: No History of harm to others?: No Assessment of Violence: None Noted Does patient have access to weapons?: No Criminal Charges Pending?: No Does patient have a court date: No Is patient on probation?: No  Psychosis Hallucinations: None  noted Delusions: None noted  Mental Status Report Appearance/Hygiene: Unremarkable Eye Contact: Good Motor Activity: Unremarkable Speech: Logical/coherent Level of Consciousness: Alert Mood: Depressed Affect: Appropriate to circumstance Thought Processes: Relevant, Coherent Judgement: Partial Orientation: Person, Place, Time, Situation Obsessive Compulsive Thoughts/Behaviors: Moderate  Cognitive Functioning Concentration: Normal Memory: Recent Intact, Remote Intact Is patient IDD: No Is IQ score available?: No Insight: Poor Impulse Control: Fair Appetite: Good Have you had any weight changes? : No Change Sleep: No Change Total Hours of Sleep: 8 Vegetative Symptoms: None  ADLScreening [facility] Assessment Services) Patient's cognitive ability adequate to safely complete daily activities?: Yes Patient able to express need for assistance with ADLs?: Yes Independently performs ADLs?: Yes (appropriate for developmental age)  Prior Inpatient Therapy Prior Inpatient Therapy: Yes Prior Therapy Dates: 2019 and mult other admissions Prior Therapy Facilty/Provider(s): [facility], [facility], [facility] Reason for Treatment: SI, BIPOLAR  Prior Outpatient Therapy Prior Outpatient Therapy: No Does patient have an ACCT team?: Yes Does patient have Intensive In-House Services?  : No Does patient have [facility] services? : No Does patient have P4CC services?: No  ADL Screening (condition at time of admission) Patient's cognitive ability adequate to safely complete daily activities?: Yes Is the patient deaf or have difficulty hearing?: No Does the patient have difficulty seeing, even when wearing glasses/contacts?:  No Does the patient have difficulty concentrating, remembering, or making decisions?: No Patient able to express need for assistance with ADLs?: Yes Does the patient have difficulty dressing or bathing?: No Independently performs ADLs?: Yes (appropriate for developmental age) Does the  patient have difficulty walking or climbing stairs?: No Weakness of Legs: None Weakness of Arms/Hands: None       Abuse/Neglect Assessment (Assessment to be complete while patient is alone) Physical Abuse: Denies Verbal Abuse: Denies Sexual Abuse: Denies Exploitation of patient/patient's resources: Denies Self-Neglect: Denies Values / Beliefs Cultural Requests During Hospitalization: None Spiritual Requests During Hospitalization: None   Advance Directives (For Healthcare) Does Patient Have a Medical Advance Directive?: No Would patient like information on creating a medical advance directive?: No - Patient declined          Disposition:  Disposition Initial Assessment Completed for this Encounter: Yes Patient referred to: Other (Comment)(current ACTT provider)  This service was provided via telemedicine using a 2-way, interactive audio and video technology.  Names of all persons participating in this telemedicine service and their role in this encounter.   Rexene Edison 01/05/2018 12:20 PM

## 2018-01-05 NOTE — ED Notes (Signed)
P 

## 2018-01-05 NOTE — ED Notes (Signed)
This RN called staffing to request sitter, was told there aren't any available at this time. EMT Tray at bedside.

## 2018-01-05 NOTE — Consult Note (Addendum)
  Tele psych Assessment   [name], 27 y.o., female patient seen via tele psych by TTS and this provider; chart reviewed and consulted with Dr. Dwyane Dee on 01/05/18.  On evaluation Fatimah A Grinnell reports that she is lonely at home.  Patient states that she does not want to kill herself just lonely at home with nothing to do and no friends.  "I use [place] but a girl want to fight me cause I was hanging around a guy that was on disability; but I don't no more."  Patient also denies homicidal ideation, psychosis, and paranoia.  Patient states that she still wants to live in a group home or a family care home so she won't have to be alone.  Patient states that her ACT team is working on finding her placement.  "They keep telling me that they are working on it; but it's taking so long." During evaluation Nafeesah A Navarrete is alert/oriented x 4; calm/cooperative; and mood is congruent with affect.  She does not appear to be responding to internal/external stimuli or delusional thoughts; and denies suicidal/self-harm/homicidal ideation, psychosis, and paranoia.  Patient answered question appropriately. TTS spoke with ACT team member who states they are working on group home placement and actually has a Microbiologist.  For detailed note see TTS tele assessment note  Recommendations:  Follow up with current outpatient psychiatric provider and ACT team  Disposition:  Patient is psychiatrically cleared No evidence of imminent risk to self or others at present.   Recommend psychiatric Inpatient admission when medically cleared. Patient does not meet criteria for psychiatric inpatient admission. Supportive therapy provided about ongoing stressors.  Discussed crisis plan, support from social network, calling 911, coming to the Emergency Department, and calling Suicide Hotline.   Spoke with Dr. Ronnald Nian; informed of above recommendation and disposition  [name], NP

## 2018-01-05 NOTE — ED Notes (Signed)
Lunch tray ordered 

## 2018-01-05 NOTE — ED Provider Notes (Signed)
[facility] EMERGENCY DEPARTMENT Provider Note   CSN: [id number] Arrival date & time: 01/05/18  0747     History   Chief Complaint Chief Complaint  Patient presents with  . Suicidal  . Vaginal Bleeding    HPI [name] is a 27 y.o. female.  Patient stating that she is in a kill herself.  Patient also believing that demons are going to get her.  She denies any communication with them or them telling her to do things.  Patient was acting out required Geodon.  Patient's past medical history significant for bipolar disorder.  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  Patient is seen frequently.  Just seen by behavioral health on September 9.  Patient also was talking about vaginal bleeding.  Prior to the Geodon patient was agitated.     Past Medical History:  Diagnosis Date  . Anxiety   . Bipolar 1 disorder ([place])   . Cognitive deficits   . Depression   . Diabetes mellitus without complication ([place])   . Hypertension   . Mental disorder   . Obesity     Patient Active Problem List   Diagnosis Date Noted  . Adjustment disorder with emotional disturbance 01/02/2018  . 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 Moderate bipolar I disorder, most recent episode depressed ([place])   . Psychosis ([place])   . 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 LGSIL on Pap smear of cervix 02/01/2017  . OCD (obsessive compulsive disorder) 10/05/2016  . Major depressive disorder, recurrent episode, mild ([place]) 05/04/2016  . 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 Learning disability 07/18/2015  . 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 Diabetes mellitus ([place]) 07/18/2015  . 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 Hyperlipidemia 07/18/2015  . Severe episode of recurrent major depressive disorder, without psychotic features ([place])   .  Drug overdose   . Cognitive deficits 10/12/2012  . Generalized anxiety disorder 06/28/2012    Past Surgical History:  Procedure Laterality Date  . CESAREAN SECTION    . CESAREAN SECTION N/A 04/25/2013   Procedure: REPEAT CESAREAN SECTION;  Surgeon: [name], MD;  Location: [facility];  Service: Obstetrics;  Laterality: N/A;  . MASS EXCISION N/A 06/03/2012   Procedure: EXCISION MASS;  Surgeon: [name], MD;  Location: [facility];  Service: ENT;  Laterality: N/A;  Excision uvula mass  . TONSILLECTOMY N/A 06/03/2012   Procedure: TONSILLECTOMY;  Surgeon: [name], MD;  Location: [facility];  Service: ENT;  Laterality: N/A;  . TONSILLECTOMY       OB History    Gravida  3   Para  3   Term  3   Preterm  0   AB  0   Living  3     SAB  0   TAB  0   Ectopic  0   Multiple  0   Live Births  3            Home Medications    Prior to Admission medications   Medication Sig Start Date End Date Taking? Authorizing Provider  ABILIFY MAINTENA 400 MG PRSY prefilled syringe Inject 400 mg as directed every 30 (thirty) days. 01/04/18  Yes [provider]  busPIRone (BUSPAR) 15 MG tablet Take 1 tablet (15 mg total) by mouth 3 (three) times daily. Patient taking differently: Take 15 mg by  mouth 2 (two) times daily.  12/06/17  Yes [name], NP  GNP MELATONIN 3 MG TABS Take 3 mg by mouth at bedtime.  12/22/17  Yes [provider]  insulin detemir (LEVEMIR) 100 UNIT/ML injection Inject 40 Units into the skin 2 (two) times daily.   Yes [provider]  lithium carbonate (LITHOBID) 300 MG CR tablet Take 300 mg by mouth 2 (two) times daily.  12/22/17  Yes [provider]  nicotine polacrilex (NICORETTE) 2 MG gum Take 1 each (2 mg total) by mouth every hour as needed for smoking cessation. 12/06/17  Yes [name], NP  glipiZIDE (GLUCOTROL) 10 MG tablet Take 10 mg by mouth daily before  breakfast.    [provider]    Family History Family History  Problem Relation Age of Onset  . Hypertension Mother   . Diabetes Father     Social History Social History   Tobacco Use  . Smoking status: Current Every Day Smoker    Packs/day: 3.00    Years: 11.00    Pack years: 33.00    Types: Cigarettes  . Smokeless tobacco: Never Used  Substance Use Topics  . Alcohol use: No  . Drug use: No    Comment: Patient denies     Allergies   Wellbutrin [bupropion]; Omnipaque [iohexol]; Penicillins; Cogentin [benztropine]; and Depakote er [divalproex sodium er]   Review of Systems Review of Systems  Unable to perform ROS: Psychiatric disorder     Physical Exam Updated Vital Signs BP (!) 132/91   Pulse (!) 109   Temp 98.4 F (36.9 C) (Oral)   Resp 16   Ht 1.702 m (5\' 7" )   Wt 108.4 kg   SpO2 100%   BMI 37.43 kg/m   Physical Exam  Constitutional: She appears well-developed and well-nourished. She appears distressed.  HENT:  Head: Normocephalic and atraumatic.  Mouth/Throat: Oropharynx is clear and moist.  Eyes: Pupils are equal, round, and reactive to light. Conjunctivae and EOM are normal.  Neck: Neck supple.  Cardiovascular: Normal rate, regular rhythm and normal heart sounds.  Pulmonary/Chest: Effort normal and breath sounds normal.  Abdominal: Soft. Bowel sounds are normal. There is no tenderness.  Genitourinary:  Genitourinary Comments: No evidence of any significant vaginal bleeding.  Musculoskeletal: Normal range of motion.  Neurological: She is alert. No cranial nerve deficit. She exhibits normal muscle tone. Coordination normal.  Skin: Skin is warm.  Psychiatric:  Patient agitated.  Expressing suicidal thoughts.  Having delusions of demons.  Nursing note and vitals reviewed.    ED Treatments / Results  Labs (all labs ordered are listed, but only abnormal results are displayed) Labs Reviewed  COMPREHENSIVE METABOLIC PANEL - Abnormal;  Notable for the following components:      Result Value   Potassium 3.4 (*)    Glucose, Bld 154 (*)    All other components within normal limits  ACETAMINOPHEN LEVEL - Abnormal; Notable for the following components:   Acetaminophen (Tylenol), Serum <10 (*)    All other components within normal limits  ETHANOL  SALICYLATE LEVEL  CBC  RAPID URINE DRUG SCREEN, HOSP PERFORMED  I-STAT BETA HCG BLOOD, ED ([facility], [facility], [facility] ONLY)    EKG None  Radiology No results found.  Procedures Procedures (including critical care time)  Medications Ordered in ED Medications  ziprasidone (GEODON) injection 20 mg (20 mg Intramuscular Given 01/05/18 0837)     Initial Impression / Assessment and Plan / ED Course  I have reviewed the triage vital signs and the nursing notes.  Pertinent labs & imaging results that were available during my care of the patient were reviewed by me and considered in my medical decision making (see chart for details).    Patient's urine drug screen still pending.  Patient pregnancy test negative.  Hemoglobin hematocrit normal.  No evidence of any significant vaginal blood loss.  Patient was very agitated and acting out upon arrival required Geodon which settled her down.  Patient is seen frequently by [place] as well as behavioral health.  She is stating that she is suicidal.  Patient will be moved to pod F.  Patient medically cleared for evaluation by behavioral health.  Final Clinical Impressions(s) / ED Diagnoses   Final diagnoses:  Suicidal ideation  Delusions [facility])    ED Discharge Orders    None       Fredia Sorrow, MD 01/05/18 1056

## 2018-01-05 NOTE — ED Notes (Signed)
Pt yelling and cursing at the top of her lungs and pounding her fists on the bed. PA Lawyer notified.

## 2018-01-05 NOTE — ED Triage Notes (Addendum)
Pt here from home tearful stating she wants to go to a group home and is very depressed, states she wants to overdose and kill herself. Pt pacing the room and reports witchcraft and demons have put a spell on her. Pt also reports heavy vaginal bleeding. Pt ambulatory, denies pain.

## 2018-01-05 NOTE — ED Notes (Addendum)
PT DC with ACT team ,Pt smiling and laughing as she was leaving Dept.

## 2018-01-05 NOTE — Progress Notes (Signed)
Disposition CSW asked to send a list of ALFs, [facility] and group homes to patient. Faxed list for [facility].  [name]. Judi Cong, MSW, [place] Disposition Clinical Social Work [phone] (cell) [phone] (office)

## 2018-01-05 NOTE — ED Notes (Signed)
TTS called, now consulting Pt.

## 2018-01-05 NOTE — Discharge Instructions (Signed)
Substance Abuse Treatment Programs ° °Intensive Outpatient Programs °[facility]     °[address]      °[place], [place]                   °[phone]      ° °[facility] °[address] °[place], [place] °[phone] ° °[facility]     °(Inpatient and outpatient)     °[address]           °[phone]   ° °[facility] °336-288-1484 (Suboxone and Methadone) ° °[address]      °[place], [place] [address]      °336-882-2125      ° °[address] °[place], [place] °852-3033 ° °[facility] (Outpatient/Inpatient, Chemical)    °(insurance only) [phone]      °       °[facility] (Groups & Residential) °[place], [place] °336-389-1413 ° °   °[facility]     °[address]     °[place], [place]      °[phone]      ° °[facility] (for caregivers and family) °[address] °[place], [place] °336-299-4655 ° ° ° ° ° °Residential Treatment Programs °[facility]      °[address], [place], [place] [address]  °([phone]      ° °[facility] °[name] [place], [place] [address] °[phone] ° °[facility]        °336-248-8914      ° °[facility] °1-800-659-3381 ° °[facility]             °[address]                                         °[place], [place]                                                °877-615-2722 or 336-784-9470                              ° °[facility] [place] °[address] °[place], [address] °1.877.941.8954 ° °[facility]    °[address]      °[place], [place]     °[phone]      ° °[facility] °[address] °[place], [place] °336-285-9073 ° °[facility]   °[address]     °[place], [place] [address]     °[phone]      °Admissions: 8am-3pm M-F ° °[facility] °[address] °[place] °336-227-7417 ° °[facility]: Residential Program (90 Days)   °[place], [place]      °336-725-8389 or 800-758-6077    ° °[facility] °[place], [place] °(Walk in Hours over the weekend or by referral) ° °[facility] °[address], [place], [place] [address] °([phone] ° °Crisis Mobile: [facility]:  1-877-626-1772 (for crisis response 24 hours a day) °[facility] Hotline:      1-800-256-2452 °Outpatient Psychiatry and Counseling ° °[facility]: Mobile Crisis   Management 24 hours:  1-877-626-1772 ° °[facility] sliding scale fee and walk in schedule: M-F 8am-12pm/1pm-3pm °[address]  °[place], [place] [address] °[phone] ° °[facility] °[address] °[place], [place] [address] °336-703-9650 ° °[facility] (Former[facility] known [facility])- new patient walk-in appointments available Monday - Friday 8am -3pm.          °[address] °[place], [place] [address] °336-676-6840 or crisis line- 336-676-6905 ° °[facility]/ Intensive Outpatient Therapy Program °[address] °[place], [place] [address] °336-832-9804 ° °[facility]                  °Crisis Services      °[phone]      °[address]     °[place], [place] [address]                ° °[facility]   °[facility] °[phone] °[address] °[place], [place] [address] ° ° °[facility]          °[address]  °[place], [place] [address]       °([phone] ° °[facility] °[address] °[place], [place] [address] °336-292-1510 ° °[facility]    °[address]    °[place], [place] [address]     °[phone]      ° °Parish McKinney, MD     °[address]     °[place] [address]     °336-282-1251     °  °[facility] °[address] °[place] [address] ° °[facility]     °[address]     °[place], [place]      °336-542-2076      ° °[facility] °Shamsher Ahluwalia, MD °[address] °[place], [place] [address] °[phone] ° °[facility]     °[address]     °[place], [place] [address]     °336-274-1237      ° °[facility] °[address] °[place], [place] [address] °336-854-4450 °Resources for Temporary Residential Assistance/Crisis Centers ° °DAY CENTERS °[facility] °M-F 8am-3pm   °[address] [place], [place] [address]   336-332-0824 °Services include: laundry, barbering, support groups, case management, phone  & computer access, showers, AA/NA mtgs, mental health/substance abuse nurse, job skills class, disability information, VA assistance, spiritual classes, etc.  ° °HOMELESS SHELTERS ° °[facility]     °[facility]   °[address], [place]     °[phone]       °       °[facility] (women and children)       °[address] °[place], [place] [address] °336-275-0820 °[email or web address] for application and process °Application Required ° °[facility]   °[address]    °[place] [address]     °[phone]       °             °[facility] °[address] °[place], [place] [address] °336.273.5572 °336-235-0363(schedule application appt.) °Application Required ° °[facility] (women only)    °[address]     °[place], [place] [address]     °336-884-1039      °  Intake starts 6pm daily °Need valid ID, SSC, & Police report °[facility] [place] °[address] °[place], [place] °336-881-5420 °Application Required ° °[facility] (men only)     °[address]      °[place], [place]     °[phone]      ° °[facility] °(Pregnant women only) °[address] °[place], [place] °336-275-0206 ° °[facility]      °[address]      °[place], [place] [address]     °[phone]      °       °[facility] °[address] °[place], [place] °336-723-1848 °90 day commitment/SA/Application process ° °[facility](men only)     °[address]     °[place], [place]     °[phone]       °Check-in at 7pm     °       °[facility] °[address] °[place], [place] [address] °336-248-6684 °Men/Women/Women and Children must be there by 7 pm ° °[facility] °[place], [place] °336-722-8721                ° °

## 2018-01-07 ENCOUNTER — Other Ambulatory Visit: Payer: Self-pay

## 2018-01-07 ENCOUNTER — Encounter (HOSPITAL_COMMUNITY): Payer: Self-pay

## 2018-01-07 ENCOUNTER — Emergency Department (HOSPITAL_COMMUNITY)
Admission: EM | Admit: 2018-01-07 | Discharge: 2018-01-08 | Disposition: A | Discharge status: Home / Self Care | Payer: Medicaid Other | Attending: Emergency Medicine | Admitting: Emergency Medicine

## 2018-01-07 DIAGNOSIS — Z5321 Procedure and treatment not carried out due to patient leaving prior to being seen by health care provider: Secondary | ICD-10-CM | POA: Insufficient documentation

## 2018-01-07 DIAGNOSIS — N939 Abnormal uterine and vaginal bleeding, unspecified: Secondary | ICD-10-CM | POA: Diagnosis present

## 2018-01-07 NOTE — ED Triage Notes (Signed)
Pt reports vaginal bleeding x2 weeks. She endorses mild lower abdominal pain. Pt states when she was wearing pads she has been going through about 4 pads a day. Hx of same. A&Ox4. NAD. Ambulatory.

## 2018-01-07 NOTE — ED Notes (Signed)
Pt called for recheck v/s, no response from lobby 

## 2018-01-07 NOTE — ED Notes (Signed)
No answer for blood draw @ this time. 

## 2018-01-11 ENCOUNTER — Other Ambulatory Visit: Payer: Self-pay

## 2018-01-11 ENCOUNTER — Emergency Department (HOSPITAL_COMMUNITY)
Admission: EM | Admit: 2018-01-11 | Discharge: 2018-01-11 | Disposition: A | Discharge status: Home / Self Care | Payer: Medicaid Other | Attending: Emergency Medicine | Admitting: Emergency Medicine

## 2018-01-11 ENCOUNTER — Encounter (HOSPITAL_COMMUNITY): Payer: Self-pay

## 2018-01-11 DIAGNOSIS — F17210 Nicotine dependence, cigarettes, uncomplicated: Secondary | ICD-10-CM | POA: Diagnosis not present

## 2018-01-11 DIAGNOSIS — Z79899 Other long term (current) drug therapy: Secondary | ICD-10-CM | POA: Insufficient documentation

## 2018-01-11 DIAGNOSIS — E119 Type 2 diabetes mellitus without complications: Secondary | ICD-10-CM | POA: Diagnosis not present

## 2018-01-11 DIAGNOSIS — I10 Essential (primary) hypertension: Secondary | ICD-10-CM | POA: Insufficient documentation

## 2018-01-11 DIAGNOSIS — N938 Other specified abnormal uterine and vaginal bleeding: Secondary | ICD-10-CM | POA: Diagnosis not present

## 2018-01-11 DIAGNOSIS — N939 Abnormal uterine and vaginal bleeding, unspecified: Secondary | ICD-10-CM

## 2018-01-11 LAB — CBC WITH DIFFERENTIAL/PLATELET
BASOS ABS: 0.1 10*3/uL (ref 0.0–0.1)
Basophils Relative: 1 %
EOS PCT: 4 %
Eosinophils Absolute: 0.3 10*3/uL (ref 0.0–0.7)
HCT: 42.1 % (ref 36.0–46.0)
HEMOGLOBIN: 13.8 g/dL (ref 12.0–15.0)
Lymphocytes Relative: 34 %
Lymphs Abs: 2.9 10*3/uL (ref 0.7–4.0)
MCH: 27.2 pg (ref 26.0–34.0)
MCHC: 32.8 g/dL (ref 30.0–36.0)
MCV: 82.9 fL (ref 78.0–100.0)
Monocytes Absolute: 0.3 10*3/uL (ref 0.1–1.0)
Monocytes Relative: 4 %
NEUTROS PCT: 57 %
Neutro Abs: 5 10*3/uL (ref 1.7–7.7)
Platelets: 257 10*3/uL (ref 150–400)
RBC: 5.08 MIL/uL (ref 3.87–5.11)
RDW: 13.9 % (ref 11.5–15.5)
WBC: 8.6 10*3/uL (ref 4.0–10.5)

## 2018-01-11 LAB — URINALYSIS, ROUTINE W REFLEX MICROSCOPIC
BILIRUBIN URINE: NEGATIVE
Glucose, UA: NEGATIVE mg/dL
KETONES UR: 5 mg/dL — AB
LEUKOCYTES UA: NEGATIVE
Nitrite: NEGATIVE
PROTEIN: NEGATIVE mg/dL
Specific Gravity, Urine: 1.025 (ref 1.005–1.030)
pH: 5 (ref 5.0–8.0)

## 2018-01-11 LAB — BASIC METABOLIC PANEL
ANION GAP: 10 (ref 5–15)
BUN: 7 mg/dL (ref 6–20)
CO2: 28 mmol/L (ref 22–32)
Calcium: 9.7 mg/dL (ref 8.9–10.3)
Chloride: 106 mmol/L (ref 98–111)
Creatinine, Ser: 0.75 mg/dL (ref 0.44–1.00)
GFR calc Af Amer: >60 mL/min (ref >60)
GFR calc non Af Amer: >60 mL/min (ref >60)
GLUCOSE: 139 mg/dL — AB (ref 70–99)
POTASSIUM: 3.7 mmol/L (ref 3.5–5.1)
Sodium: 144 mmol/L (ref 135–145)

## 2018-01-11 LAB — I-STAT BETA HCG BLOOD, ED (MC, WL, AP ONLY)

## 2018-01-11 LAB — WET PREP, GENITAL
Clue Cells Wet Prep HPF POC: NONE SEEN
Sperm: NONE SEEN
Trich, Wet Prep: NONE SEEN
Yeast Wet Prep HPF POC: NONE SEEN

## 2018-01-11 MED ORDER — KETOROLAC TROMETHAMINE 15 MG/ML IJ SOLN
15.0000 mg | Freq: Once | INTRAMUSCULAR | Status: DC
  Filled 2018-01-11: qty 1

## 2018-01-11 NOTE — ED Notes (Signed)
Urine culture sent down to lab with UA

## 2018-01-11 NOTE — ED Notes (Signed)
Bed: DH74 Expected date:  Expected time:  Means of arrival:  Comments: EMS 27 yo Female

## 2018-01-11 NOTE — Discharge Instructions (Addendum)
You were seen in the emergency department today for vaginal bleeding and abdominal pain.  Your work-up in the emergency department was reassuring.  Your wet prep did not show findings of yeast, bacterial vaginosis, or trichomonas.  We have sent off STD testing including gonorrhea, chlamydia, HIV, and syphilis, if the test return to return positive we will call you and let you know, if this is the case she will need to inform all sexual partners and seek treatment.  Regarding your abdominal cramping, we suspect this is due to the bleeding, we recommend taking ibuprofen per over-the-counter dosing instructions to help with this.  Regarding your vaginal bleeding, your hemoglobin (a blood test) is normal which is reassuring.  Would like you to call your women's health provider to discuss your vaginal bleeding and set up an appointment for follow-up.  Please follow-up within the next 2 to 5 days.  Return to the ER anytime for new or worsening symptoms including but not limited to lightheadedness, dizziness, passing out, chest pain, trouble breathing, worsening pain, or any other concerns.

## 2018-01-11 NOTE — ED Provider Notes (Signed)
[name] Provider Note   CSN: [id number] Arrival date & time: 01/11/18  0848     History   Chief Complaint Chief Complaint  Patient presents with  . Vaginal Bleeding    HPI [name] is a 27 y.o. female with a history of tobacco abuse, bipolar 1 disorder, diabetes mellitus, hypertension, hyperlipidemia, anxiety, and prior C section who presents to the ED via EMS with complaints of vagina bleeding x 2 weeks.  Patient states that for the past 2 weeks she has had vaginal bleeding that has been waxing/waning, but overall improving.  She states that she has been utilizing pads and has had to change her pad 2-3 times per day.  She has not noted any blood clots.  She had associated pelvic cramping that comes and goes.  She has also noted some vaginal itching, she is unsure if she has had discharge as she has had the bleeding.  No specific alleviating or aggravating factors.  No interventions tried at home.  She has not called her OB/GYN about this.  She does have a Nexplanon in place and has had irregular periods previously.  She states she is currently sexually active with one partner, she does utilize protection, she is not overly concerned for STDs.  Denies fever, nausea, vomiting, chest pain, dyspnea, lightheadedness, dizziness, syncope, dysuria, or diarrhea.   HPI  Past Medical History:  Diagnosis Date  . Anxiety   . Bipolar 1 disorder ([place])   . Cognitive deficits   . Depression   . Diabetes mellitus without complication ([place])   . Hypertension   . Mental disorder   . Obesity     Patient Active Problem List   Diagnosis Date Noted  . Adjustment disorder with emotional disturbance 01/02/2018  . 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 Moderate bipolar I disorder, most recent episode depressed ([place])   . Psychosis ([place])   . 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 LGSIL on Pap smear of cervix  02/01/2017  . OCD (obsessive compulsive disorder) 10/05/2016  . Major depressive disorder, recurrent episode, mild ([place]) 05/04/2016  . 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 Learning disability 07/18/2015  . 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 Diabetes mellitus ([place]) 07/18/2015  . 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 Hyperlipidemia 07/18/2015  . Severe episode of recurrent major depressive disorder, without psychotic features ([place])   . Drug overdose   . Cognitive deficits 10/12/2012  . Generalized anxiety disorder 06/28/2012    Past Surgical History:  Procedure Laterality Date  . CESAREAN SECTION    . CESAREAN SECTION N/A 04/25/2013   Procedure: REPEAT CESAREAN SECTION;  Surgeon: [name], MD;  Location: [facility];  Service: Obstetrics;  Laterality: N/A;  . MASS EXCISION N/A 06/03/2012   Procedure: EXCISION MASS;  Surgeon: [name], MD;  Location: [facility];  Service: ENT;  Laterality: N/A;  Excision uvula mass  . TONSILLECTOMY N/A 06/03/2012   Procedure: TONSILLECTOMY;  Surgeon: [name], MD;  Location: [facility];  Service: ENT;  Laterality: N/A;  . TONSILLECTOMY       OB History    Gravida  3   Para  3   Term  3   Preterm  0   AB  0   Living  3     SAB  0   TAB  0   Ectopic  0   Multiple  0   Live Births  3            Home Medications    Prior to Admission medications   Medication Sig Start Date End Date Taking? Authorizing Provider  ABILIFY MAINTENA 400 MG PRSY prefilled syringe Inject 400 mg as directed every 30 (thirty) days. 01/04/18   [provider]  busPIRone (BUSPAR) 15 MG tablet Take 1 tablet (15 mg total) by mouth 3 (three) times daily. Patient taking differently: Take 15 mg by mouth 2 (two) times daily.  12/06/17   [name], NP  glipiZIDE (GLUCOTROL) 10 MG tablet Take 10 mg by mouth daily before breakfast.     [provider]  GNP MELATONIN 3 MG TABS Take 3 mg by mouth at bedtime.  12/22/17   [provider]  insulin detemir (LEVEMIR) 100 UNIT/ML injection Inject 40 Units into the skin 2 (two) times daily.    [provider]  lithium carbonate (LITHOBID) 300 MG CR tablet Take 300 mg by mouth 2 (two) times daily.  12/22/17   [provider]  nicotine polacrilex (NICORETTE) 2 MG gum Take 1 each (2 mg total) by mouth every hour as needed for smoking cessation. 12/06/17   [name], NP    Family History Family History  Problem Relation Age of Onset  . Hypertension Mother   . Diabetes Father     Social History Social History   Tobacco Use  . Smoking status: Current Every Day Smoker    Packs/day: 3.00    Years: 11.00    Pack years: 33.00    Types: Cigarettes  . Smokeless tobacco: Never Used  Substance Use Topics  . Alcohol use: No  . Drug use: No    Comment: Patient denies     Allergies   Wellbutrin [bupropion]; Omnipaque [iohexol]; Penicillins; Cogentin [benztropine]; and Depakote er [divalproex sodium er]   Review of Systems Review of Systems  Constitutional: Negative for chills and fever.  Respiratory: Negative for shortness of breath.   Cardiovascular: Negative for chest pain.  Gastrointestinal: Negative for constipation, diarrhea, nausea and vomiting.  Genitourinary: Positive for pelvic pain and vaginal bleeding. Negative for dysuria and frequency.       Positive for vaginal pruritus  Neurological: Negative for syncope, weakness and light-headedness.  All other systems reviewed and are negative.    Physical Exam Updated Vital Signs BP 136/79 (BP Location: Right Arm)   Pulse 99   Temp 98.5 F (36.9 C) (Oral)   Resp 20   Ht 5\' 7"  (1.702 m)   Wt 108 kg   SpO2 98%   BMI 37.29 kg/m   Physical Exam  Constitutional: She appears well-developed and well-nourished.  Non-toxic appearance. No distress.  HENT:  Head:  Normocephalic and atraumatic.  Eyes: Conjunctivae are normal. Right eye exhibits no discharge. Left eye exhibits no discharge.  Neck: Neck supple.  Cardiovascular: Normal rate and regular rhythm.  Pulmonary/Chest: Effort normal and breath sounds normal. No respiratory distress. She has no wheezes. She has no rhonchi. She has no rales.  Respiration even and unlabored  Abdominal: Soft. She exhibits no distension. There is no tenderness (mild). There is no rigidity, no rebound, no guarding, no tenderness at McBurney's point and negative Murphy's sign.  Genitourinary: Pelvic exam was performed with patient supine. There is no rash, tenderness or lesion on the right labia. There is no rash, tenderness or lesion on the left labia. Cervix exhibits no motion tenderness and no friability.  Right adnexum displays no mass and no tenderness. Left adnexum displays no mass and no tenderness. There is bleeding (minimal) in the vagina. No tenderness in the vagina. No vaginal discharge found.  Genitourinary Comments: EDT Present as chaperone.   Neurological: She is alert.  Clear speech.   Skin: Skin is warm and dry. No rash noted.  Psychiatric: She has a normal mood and affect. Her behavior is normal.  Nursing note and vitals reviewed.    ED Treatments / Results  Labs (all labs ordered are listed, but only abnormal results are displayed) Labs Reviewed  WET PREP, GENITAL - Abnormal; Notable for the following components:      Result Value   WBC, Wet Prep HPF POC FEW (*)    All other components within normal limits  BASIC METABOLIC PANEL - Abnormal; Notable for the following components:   Glucose, Bld 139 (*)    All other components within normal limits  URINALYSIS, ROUTINE W REFLEX MICROSCOPIC - Abnormal; Notable for the following components:   Hgb urine dipstick MODERATE (*)    Ketones, ur 5 (*)    Bacteria, UA FEW (*)    All other components within normal limits  CBC WITH DIFFERENTIAL/PLATELET  RPR    HIV ANTIBODY (ROUTINE TESTING W REFLEX)  I-STAT BETA HCG BLOOD, ED ([facility], [facility], [facility] ONLY)  GC/CHLAMYDIA PROBE AMP ([facility]) NOT AT [facility]    EKG None  Radiology No results found.  Procedures Procedures (including critical care time)  Medications Ordered in ED Medications - No data to display   Initial Impression / Assessment and Plan / ED Course  I have reviewed the triage vital signs and the nursing notes.  Pertinent labs & imaging results that were available during my care of the patient were reviewed by me and considered in my medical decision making (see chart for details).   Patient presents with complaints of vaginal bleeding with associated intermittent pelvic cramping as well as some vaginal itching.  Nontoxic-appearing, in no apparent distress, initial vitals WNL.  Patient has a fairly benign physical exam.  Pelvic exam performed-patient has minimal blood in the vaginal vault, no significant discharge, no apparent rashes or lesions.  No significant abdominal, adnexal, or cervical motion tenderness, no peritoneal signs.  Patient's labs have been reviewed: No leukocytosis.  No anemia.  No significant electrolyte disturbance.  Glucose elevated in setting of history of diabetes.  Patient's urinalysis is consistent with vaginal bleeding, does not appear to have infection.  Wet prep is without BV, trichomonas, or yeast.  Pregnancy test is negative, doubt ectopic pregnancy.  Given ongoing bilateral symptoms for 2 weeks, doubt ovarian torsion.  She is sexually active utilizaing protection, no significant findings on pelvic, doubt PID.  Patient without peritoneal signs on abdominal exam to raise concern for findings such as appendicitis, cholecystitis, pancreatitis, or bowel obstruction/perforation.  Patient appears hemodynamically stable, hemoglobin/hematocrit within normal limits at 13.8 and 42.1 respectively.  She is not tachycardic or hypotensive.  Bleeding on exam appears to be  minimal.  Query if patient's irregular bleeding is secondary to her Nexplanon.  Recommended patient follow-up with her OB/GYN, ibuprofen for discomfort,  appears safe for discharge home at this time. I discussed results, treatment plan, need for follow-up, and return precautions with the patient. Provided opportunity for questions, patient confirmed understanding and is in agreement with plan.    Final Clinical Impressions(s) / ED Diagnoses   Final diagnoses:  Vaginal bleeding    ED Discharge Orders  None       Leafy Kindle 01/11/18 1159    Hayden Rasmussen, MD 01/11/18 [phone]

## 2018-01-11 NOTE — ED Triage Notes (Signed)
Pt comes from home. Pt arrived via [facility]. Pt called 911 due to having vaginal bleeding for 2 weeks. Pt has no other complaints. Pt is AOx4 and ambulatory.

## 2018-01-12 ENCOUNTER — Ambulatory Visit: Payer: Medicaid Other | Admitting: Family Medicine

## 2018-01-12 VITALS — BP 130/86 | HR 106 | Wt 242.2 lb

## 2018-01-12 DIAGNOSIS — N939 Abnormal uterine and vaginal bleeding, unspecified: Secondary | ICD-10-CM

## 2018-01-12 LAB — RPR: RPR Ser Ql: NONREACTIVE

## 2018-01-12 LAB — HIV ANTIBODY (ROUTINE TESTING W REFLEX): HIV Screen 4th Generation wRfx: NONREACTIVE

## 2018-01-12 LAB — GC/CHLAMYDIA PROBE AMP (~~LOC~~) NOT AT ARMC
CHLAMYDIA, DNA PROBE: NEGATIVE
NEISSERIA GONORRHEA: NEGATIVE

## 2018-01-12 NOTE — Progress Notes (Signed)
Pt came in today for bleeding that she reports has been going on for two weeks.  During the two weeks eight of those days she had heavy bleeding.  Pt has a Nexplanon inserted and she states that she did not have a period for several months until now.  Pt reports that she feels tired.  Pt's chart shows that she had a CBC drawn on 01/11/18 that was normal.  Notified Gerri Lins, DO who recommended that the pt go to her PCP for evaluation of her fatigue.  Pt notified.  Pt agreed to recommendation with no further questions.

## 2018-01-14 NOTE — Progress Notes (Signed)
Agree with nursing documentation. Patient offered appointment as walked into clinic, but she would have to wait about an hour to be seen given clinic running behind schedule. Seen yesterday in ED for same, was brought in by EMS. GU exam at that time showed minimal vaginal bleeding. Was encouraged to start ibuprofen. Has PCP to follow-up with for fatigue as no anemia on labs.

## 2018-02-21 ENCOUNTER — Other Ambulatory Visit: Payer: Self-pay | Admitting: Physician Assistant

## 2018-02-22 ENCOUNTER — Other Ambulatory Visit: Payer: Self-pay | Admitting: Physician Assistant

## 2018-02-22 DIAGNOSIS — N939 Abnormal uterine and vaginal bleeding, unspecified: Secondary | ICD-10-CM

## 2018-02-28 ENCOUNTER — Ambulatory Visit (INDEPENDENT_AMBULATORY_CARE_PROVIDER_SITE_OTHER): Payer: Medicaid Other | Admitting: Podiatry

## 2018-02-28 ENCOUNTER — Encounter: Payer: Self-pay | Admitting: Podiatry

## 2018-02-28 DIAGNOSIS — E119 Type 2 diabetes mellitus without complications: Secondary | ICD-10-CM

## 2018-02-28 DIAGNOSIS — M2011 Hallux valgus (acquired), right foot: Secondary | ICD-10-CM

## 2018-02-28 DIAGNOSIS — M2010 Hallux valgus (acquired), unspecified foot: Secondary | ICD-10-CM

## 2018-02-28 DIAGNOSIS — M2012 Hallux valgus (acquired), left foot: Secondary | ICD-10-CM

## 2018-02-28 NOTE — Progress Notes (Signed)
This patient presents to the office for diabetic foot exam.  Patient states that she has been diabetic for about 4 years and is taking insulin.  She states she is not having any pain or discomfort to her feet.  She does admit that she has bunions both feet.  She presents the office today for diabetic foot exam per her medical doctor.  General Appearance  Alert, conversant and in no acute stress.  Vascular  Dorsalis pedis and posterior tibial  pulses are weakly  palpable  bilaterally.  Capillary return is within normal limits  bilaterally. Temperature is within normal limits  bilaterally.  Neurologic  Senn-Weinstein monofilament wire test within normal limits  bilaterally. Muscle power within normal limits bilaterally.  Nails Normal asymptomatic nails  Both feet.  Orthopedic  No limitations of motion  feet .  No crepitus or effusions noted.  HAV  B/L   Skin  normotropic skin with no porokeratosis noted bilaterally.  No signs of infections or ulcers noted.    HAV  B/L  Diabetes with no foot complications.  IE this patient had a diabetic foot exam performed and she was found to have weak pulses but no neuropathy noted.  HAV  B/L.  RTC 1 year foot annual foot exam.  Gardiner Barefoot DPM

## 2018-03-01 ENCOUNTER — Ambulatory Visit
Admission: RE | Admit: 2018-03-01 | Discharge: 2018-03-01 | Disposition: A | Discharge status: Home / Self Care | Payer: Medicaid Other | Source: Ambulatory Visit | Attending: Physician Assistant | Admitting: Physician Assistant

## 2018-03-01 DIAGNOSIS — N939 Abnormal uterine and vaginal bleeding, unspecified: Secondary | ICD-10-CM

## 2018-03-18 ENCOUNTER — Other Ambulatory Visit (HOSPITAL_COMMUNITY)
Admission: RE | Admit: 2018-03-18 | Discharge: 2018-03-18 | Disposition: A | Discharge status: Home / Self Care | Payer: Medicaid Other | Source: Ambulatory Visit | Attending: Obstetrics and Gynecology | Admitting: Obstetrics and Gynecology

## 2018-03-18 ENCOUNTER — Encounter: Payer: Self-pay | Admitting: Obstetrics and Gynecology

## 2018-03-18 ENCOUNTER — Ambulatory Visit (INDEPENDENT_AMBULATORY_CARE_PROVIDER_SITE_OTHER): Payer: Medicaid Other | Admitting: Obstetrics and Gynecology

## 2018-03-18 VITALS — BP 135/91 | HR 94 | Wt 243.2 lb

## 2018-03-18 DIAGNOSIS — R87612 Low grade squamous intraepithelial lesion on cytologic smear of cervix (LGSIL): Secondary | ICD-10-CM | POA: Insufficient documentation

## 2018-03-18 DIAGNOSIS — N939 Abnormal uterine and vaginal bleeding, unspecified: Secondary | ICD-10-CM

## 2018-03-18 DIAGNOSIS — Z975 Presence of (intrauterine) contraceptive device: Secondary | ICD-10-CM

## 2018-03-18 DIAGNOSIS — N921 Excessive and frequent menstruation with irregular cycle: Secondary | ICD-10-CM

## 2018-03-18 LAB — CBC
HEMATOCRIT: 37.9 % (ref 34.0–46.6)
Hemoglobin: 12.5 g/dL (ref 11.1–15.9)
MCH: 26.7 pg (ref 26.6–33.0)
MCHC: 33 g/dL (ref 31.5–35.7)
MCV: 81 fL (ref 79–97)
Platelets: 280 10*3/uL (ref 150–450)
RBC: 4.68 x10E6/uL (ref 3.77–5.28)
RDW: 13.1 % (ref 12.3–15.4)
WBC: 13.3 10*3/uL — AB (ref 3.4–10.8)

## 2018-03-18 NOTE — Progress Notes (Signed)
Obstetrics and Gynecology Return Patient Evaluation  Appointment Date: 03/18/2018  OBGYN Clinic: [facility]  Primary Care Provider: Benito Mccreedy  Referring Provider: Benito Mccreedy, MD  Chief Complaint:  Chief Complaint  Patient presents with  . Nexplanon Removal  AUB  History of Present Illness: [name] is a 27 y.o. biracial [id number] (No LMP recorded. Patient has had an implant.), seen for the above chief complaint.  Patient states she's had AUB for the past five years. Had new nexplanon placed in 2018 and didn't mention the aub when new one was placed. She states she has bleeding almost daily, no pain. Last cbc in September normal.  No s/s of anemia.    Review of Systems:  as noted in the History of Present Illness.  Patient Active Problem List   Diagnosis Date Noted  . Adjustment disorder with emotional disturbance 01/02/2018  . 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 Moderate bipolar I disorder, most recent episode depressed ([place])   . Psychosis ([place])   . 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 LGSIL on Pap smear of cervix 02/01/2017  . OCD (obsessive compulsive disorder) 10/05/2016  . Major depressive disorder, recurrent episode, mild ([place]) 05/04/2016  . 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 Learning disability 07/18/2015  . 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 Diabetes mellitus ([place]) 07/18/2015  . MDD (major depressive disorder), recurrent, severe, with psychosis ([place]) 07/18/2015  . Hyperlipidemia 07/18/2015  . Severe episode of recurrent major depressive disorder, without psychotic features ([place])   . Drug overdose   . Cognitive deficits 10/12/2012  . Generalized anxiety disorder 06/28/2012    Past Medical History:  Past Medical History:  Diagnosis Date  . Anxiety   . Bipolar 1 disorder ([place])   . Cognitive deficits   . Depression   . Diabetes mellitus without  complication ([place])   . Hypertension   . Mental disorder   . Obesity     Past Surgical History:  Past Surgical History:  Procedure Laterality Date  . CESAREAN SECTION    . CESAREAN SECTION N/A 04/25/2013   Procedure: REPEAT CESAREAN SECTION;  Surgeon: [name], MD;  Location: [facility];  Service: Obstetrics;  Laterality: N/A;  . MASS EXCISION N/A 06/03/2012   Procedure: EXCISION MASS;  Surgeon: [name], MD;  Location: [place];  Service: ENT;  Laterality: N/A;  Excision uvula mass  . TONSILLECTOMY N/A 06/03/2012   Procedure: TONSILLECTOMY;  Surgeon: [name], MD;  Location: [facility] [facility];  Service: ENT;  Laterality: N/A;  . TONSILLECTOMY      Past Obstetrical History:  OB History  Gravida Para Term Preterm AB Living  3 3 3  0 0 3  SAB TAB Ectopic Multiple Live Births  0 0 0 0 3    # Outcome Date GA Lbr Len/2nd Weight Sex Delivery Anes PTL Lv  3 Term 04/25/13 [redacted]w[redacted]d   F CS-LTranv Spinal  LIV  2 Term 11/16/08 [redacted]w[redacted]d  7 lb (3.175 kg) F CS-LTranv Spinal  LIV  1 Term 11/13/06 [redacted]w[redacted]d  8 lb 3 oz (3.714 kg) F CS-LTranv EPI  LIV   Past Gynecological History: As per HPI. Periods: unknown History of Pap Smear(s): Yes.   Last pap 2018, which was LSIL with negative adequate colpo biopsies and ECC She is currently using nexplanon for contraception.   Social History:  Social History   Socioeconomic History  . Marital status: Single    Spouse name:  Not on file  . Number of children: Not on file  . Years of education: Not on file  . Highest education level: Not on file  Occupational History  . Not on file  Social Needs  . Financial resource strain: Not on file  . Food insecurity:    Worry: Not on file    Inability: Not on file  . Transportation needs:    Medical: Not on file    Non-medical: Not on file  Tobacco Use  . Smoking status: Current Every Day Smoker    Packs/day: 3.00    Years: 11.00    Pack years: 33.00    Types: Cigarettes   . Smokeless tobacco: Never Used  Substance and Sexual Activity  . Alcohol use: No  . Drug use: No    Comment: Patient denies  . Sexual activity: Yes    Birth control/protection: Implant  Lifestyle  . Physical activity:    Days per week: Not on file    Minutes per session: Not on file  . Stress: Not on file  Relationships  . Social connections:    Talks on phone: Not on file    Gets together: Not on file    Attends religious service: Not on file    Active member of club or organization: Not on file    Attends meetings of clubs or organizations: Not on file    Relationship status: Not on file  . Intimate partner violence:    Fear of current or ex partner: Not on file    Emotionally abused: Not on file    Physically abused: Not on file    Forced sexual activity: Not on file  Other Topics Concern  . Not on file  Social History Narrative  . Not on file    Family History:  Family History  Problem Relation Age of Onset  . Hypertension Mother   . Diabetes Father     Medications [name] had no medications administered during this visit. Current Outpatient Medications  Medication Sig Dispense Refill  . ABILIFY MAINTENA 400 MG PRSY prefilled syringe Inject 400 mg as directed every 30 (thirty) days.  6  . busPIRone (BUSPAR) 15 MG tablet Take 1 tablet (15 mg total) by mouth 3 (three) times daily. (Patient taking differently: Take 15 mg by mouth 2 (two) times daily. ) 30 tablet 0  . hydrochlorothiazide (HYDRODIURIL) 25 MG tablet Take 25 mg by mouth daily.    . insulin detemir (LEVEMIR) 100 UNIT/ML injection Inject 40 Units into the skin 2 (two) times daily.    Marland Kitchen lithium carbonate (LITHOBID) 300 MG CR tablet Take 300 mg by mouth 2 (two) times daily.   6  . nicotine polacrilex (NICORETTE) 2 MG gum Take 1 each (2 mg total) by mouth every hour as needed for smoking cessation. 100 tablet 0   No current facility-administered medications for this visit.      Allergies Wellbutrin [bupropion]; Omnipaque [iohexol]; Penicillins; Cogentin [benztropine]; and Depakote er [divalproex sodium er]   Physical Exam:  BP (!) 135/91   Pulse 94   Wt 243 lb 3.2 oz (110.3 kg)   BMI 38.09 kg/m  Body mass index is 38.09 kg/m. General appearance: Well nourished, well developed female in no acute distress.   Cardiovascular: normal s1 and s2.  No murmurs, rubs or gallops. Respiratory:  Clear to auscultation bilateral. Normal respiratory effort Abdomen: obese, positive bowel sounds and no masses, hernias; diffusely non tender to palpation, non distended  Neuro/Psych:  Normal mood and affect.  Skin:  Warm and dry.  Lymphatic:  No inguinal lymphadenopathy.   Pelvic exam: is limited by body habitus EGBUS: within normal limits, Vagina: normal, 69mL old blood in the vault, Cervix: very difficult to see. Very anterior and high and had to use extra long speculum, no active bleeding, normal appearing. Uterus:  nonenlarged and non tender and Adnexa:  normal adnexa and no mass, fullness, tenderness Rectovaginal: deferred  Laboratory: none  Radiology:  CLINICAL DATA:  Pain with abnormal bleeding  EXAM: TRANSABDOMINAL AND TRANSVAGINAL ULTRASOUND OF PELVIS  TECHNIQUE: Both transabdominal and transvaginal ultrasound examinations of the pelvis were performed. Transabdominal technique was performed for global imaging of the pelvis including uterus, ovaries, adnexal regions, and pelvic cul-de-sac. It was necessary to proceed with endovaginal exam following the transabdominal exam to visualize the uterus endometrium and ovaries.  COMPARISON:  Ultrasound 02/12/2017  FINDINGS: Uterus  Measurements: 9.5 cm length by 3.9 cm height by 5.2 cm wide = volume: 100.5 mL. No fibroids or other mass visualized.  Endometrium  Thickness: 9.1 mm.  No focal abnormality visualized.  Right ovary  Measurements: 4.8 x 3.8 x 3.5 cm = volume: 33.5 mL. Cyst  measuring 4.7 x 2.9 x 2.8 cm  Left ovary  Measurements: 4.7 x 2.2 x 2.4 cm = volume: 13 mL. Normal appearance/no adnexal mass.  Other findings  No abnormal free fluid.  IMPRESSION: 1. Endometrial thickness of 9.1 mm. If bleeding remains unresponsive to hormonal or medical therapy, sonohysterogram should be considered for focal lesion work-up. (Ref: Radiological Reasoning: Algorithmic Workup of Abnormal Vaginal Bleeding with Endovaginal Sonography and Sonohysterography. AJR 2008; [id number]:[id number]) 2. 4.7 cm left ovarian cyst. This has benign characteristics and is a common finding in premenopausal females. No imaging follow up is required. This follows consensus guidelines: Simple Adnexal Cysts: SRU Consensus Conference Update on Follow-up and Reporting. Radiology 2019; [id number].   Electronically Signed   By: Donavan Foil M.D.   On: 03/01/2018 14:32  Assessment: pt stable  Plan:  1. Abnormal uterine bleeding (AUB) - CBC  2. Breakthrough bleeding on Implanon I d/w her that her bleeding could or couldn't be due to the nexplanon. She states that she is done with having more children and is sure of this. I d/w her re: BTL and she is interested and would like to talk to her friends and family about this. I told her that I recommend keeping in the nexplanon in while she decides but she states that she wants it out today. I also told her that a BTL wouldn't change her bleeding profile but is only for birth control - CBC  3. LGSIL on Pap smear of cervix Rpt pap today.  - Cytology - PAP  RTC 57m to follow up bleeding. Pt told that if bleeding worsens and/or if she decides to do a btl to let us know before that time.    Durene Romans MD Attending [facility] [employer] [facility])

## 2018-03-18 NOTE — Procedures (Signed)
Nexplanon Removal Procedure Note Prior to the procedure being performed, the patient (or guardian) was asked to state their full name, date of birth, type of procedure being performed and the exact location of the operative site. This information was then checked against the documentation in the patient's chart. Prior to the procedure being performed, a "time out" was performed by the physician that confirmed the correct patient, procedure and site.  After informed consent was obtained, the patient's left arm was examined and the Nexplanon rod was noted to be easily palapable. The area was cleaned with alcohol then local anesthesia was infiltrated with 3 ml of 1% lidocaine. The area was prepped with betadine. Using sterile technique, an 11 blade used to make an incision at the old scar. The Nexplanon device was brought to the incision site. The capsule was scrapped off with the scalpel, the Nexplanon grasped with hemostats, and easily removed; the removal site was hemostatic. The Nexplanon was inspected and noted to be intact.  A steri-strip and a pressure dressing was applied.  The patient tolerated the procedure well.  She chose to do abstinence for birth control. Pt to consider BTL  Durene Romans MD Attending [facility])

## 2018-03-23 LAB — CYTOLOGY - PAP
CHLAMYDIA, DNA PROBE: NEGATIVE
HPV (WINDOPATH): DETECTED — AB
HPV 16/18/45 genotyping: NEGATIVE
Neisseria Gonorrhea: NEGATIVE
TRICH (WINDOWPATH): NEGATIVE

## 2018-04-13 ENCOUNTER — Telehealth: Payer: Self-pay | Admitting: Family Medicine

## 2018-04-13 NOTE — Telephone Encounter (Signed)
LVM about cancelling the scheduled appt on 12/23 due to [place] wanting her to get her colpo done first and then have the consult a few weeks afterwards. Left office number if need to reschedule or has questions.

## 2018-04-18 ENCOUNTER — Ambulatory Visit: Payer: Self-pay | Admitting: Obstetrics and Gynecology

## 2018-05-01 ENCOUNTER — Other Ambulatory Visit: Payer: Self-pay

## 2018-05-01 ENCOUNTER — Emergency Department
Admission: EM | Admit: 2018-05-01 | Discharge: 2018-05-02 | Disposition: A | Discharge status: Other Acute Inpatient Hospital | Payer: Medicaid Other | Attending: Emergency Medicine | Admitting: Emergency Medicine

## 2018-05-01 DIAGNOSIS — R45850 Homicidal ideations: Secondary | ICD-10-CM | POA: Insufficient documentation

## 2018-05-01 DIAGNOSIS — F3132 Bipolar disorder, current episode depressed, moderate: Secondary | ICD-10-CM | POA: Diagnosis present

## 2018-05-01 DIAGNOSIS — F819 Developmental disorder of scholastic skills, unspecified: Secondary | ICD-10-CM | POA: Diagnosis present

## 2018-05-01 DIAGNOSIS — E119 Type 2 diabetes mellitus without complications: Secondary | ICD-10-CM | POA: Insufficient documentation

## 2018-05-01 DIAGNOSIS — Z046 Encounter for general psychiatric examination, requested by authority: Secondary | ICD-10-CM | POA: Insufficient documentation

## 2018-05-01 DIAGNOSIS — M791 Myalgia, unspecified site: Secondary | ICD-10-CM | POA: Insufficient documentation

## 2018-05-01 DIAGNOSIS — F32A Depression, unspecified: Secondary | ICD-10-CM

## 2018-05-01 DIAGNOSIS — F329 Major depressive disorder, single episode, unspecified: Secondary | ICD-10-CM | POA: Insufficient documentation

## 2018-05-01 DIAGNOSIS — Z794 Long term (current) use of insulin: Secondary | ICD-10-CM | POA: Insufficient documentation

## 2018-05-01 DIAGNOSIS — I10 Essential (primary) hypertension: Secondary | ICD-10-CM | POA: Insufficient documentation

## 2018-05-01 DIAGNOSIS — R45851 Suicidal ideations: Secondary | ICD-10-CM | POA: Insufficient documentation

## 2018-05-01 DIAGNOSIS — Z79899 Other long term (current) drug therapy: Secondary | ICD-10-CM | POA: Insufficient documentation

## 2018-05-01 DIAGNOSIS — F17210 Nicotine dependence, cigarettes, uncomplicated: Secondary | ICD-10-CM | POA: Insufficient documentation

## 2018-05-01 DIAGNOSIS — F639 Impulse disorder, unspecified: Secondary | ICD-10-CM | POA: Diagnosis present

## 2018-05-01 DIAGNOSIS — F419 Anxiety disorder, unspecified: Secondary | ICD-10-CM | POA: Insufficient documentation

## 2018-05-01 DIAGNOSIS — F411 Generalized anxiety disorder: Secondary | ICD-10-CM | POA: Diagnosis present

## 2018-05-01 LAB — URINE DRUG SCREEN, QUALITATIVE (ARMC ONLY)
Amphetamines, Ur Screen: NOT DETECTED
BARBITURATES, UR SCREEN: NOT DETECTED
Benzodiazepine, Ur Scrn: NOT DETECTED
COCAINE METABOLITE, UR ~~LOC~~: NOT DETECTED
Cannabinoid 50 Ng, Ur ~~LOC~~: NOT DETECTED
MDMA (Ecstasy)Ur Screen: NOT DETECTED
METHADONE SCREEN, URINE: NOT DETECTED
OPIATE, UR SCREEN: NOT DETECTED
Phencyclidine (PCP) Ur S: NOT DETECTED
TRICYCLIC, UR SCREEN: NOT DETECTED

## 2018-05-01 LAB — GLUCOSE, CAPILLARY: Glucose-Capillary: 174 mg/dL — ABNORMAL HIGH (ref 70–99)

## 2018-05-01 LAB — COMPREHENSIVE METABOLIC PANEL
ALBUMIN: 4.7 g/dL (ref 3.5–5.0)
ALT: 22 U/L (ref 0–44)
AST: 28 U/L (ref 15–41)
Alkaline Phosphatase: 48 U/L (ref 38–126)
Anion gap: 9 (ref 5–15)
BILIRUBIN TOTAL: 0.9 mg/dL (ref 0.3–1.2)
BUN: 10 mg/dL (ref 6–20)
CO2: 28 mmol/L (ref 22–32)
Calcium: 10 mg/dL (ref 8.9–10.3)
Chloride: 103 mmol/L (ref 98–111)
Creatinine, Ser: 0.79 mg/dL (ref 0.44–1.00)
GFR calc Af Amer: >60 mL/min (ref >60)
GFR calc non Af Amer: >60 mL/min (ref >60)
GLUCOSE: 182 mg/dL — AB (ref 70–99)
Potassium: 2.6 mmol/L — CL (ref 3.5–5.1)
Sodium: 140 mmol/L (ref 135–145)
TOTAL PROTEIN: 8.2 g/dL — AB (ref 6.5–8.1)

## 2018-05-01 LAB — CBC
HCT: 40.3 % (ref 36.0–46.0)
HEMOGLOBIN: 12.8 g/dL (ref 12.0–15.0)
MCH: 26.7 pg (ref 26.0–34.0)
MCHC: 31.8 g/dL (ref 30.0–36.0)
MCV: 84.1 fL (ref 80.0–100.0)
Platelets: 282 10*3/uL (ref 150–400)
RBC: 4.79 MIL/uL (ref 3.87–5.11)
RDW: 13.2 % (ref 11.5–15.5)
WBC: 12.9 10*3/uL — ABNORMAL HIGH (ref 4.0–10.5)
nRBC: 0 % (ref 0.0–0.2)

## 2018-05-01 LAB — LITHIUM LEVEL: LITHIUM LVL: 0.6 mmol/L (ref 0.60–1.20)

## 2018-05-01 LAB — SALICYLATE LEVEL: Salicylate Lvl: <7 mg/dL (ref 2.8–30.0)

## 2018-05-01 LAB — ACETAMINOPHEN LEVEL: Acetaminophen (Tylenol), Serum: <10 ug/mL — ABNORMAL LOW (ref 10–30)

## 2018-05-01 LAB — ETHANOL: Alcohol, Ethyl (B): <10 mg/dL (ref <10)

## 2018-05-01 MED ORDER — LORAZEPAM 2 MG PO TABS
2.0000 mg | ORAL_TABLET | Freq: Once | ORAL | Status: AC
  Administered 2018-05-01: 2 mg via ORAL
  Filled 2018-05-01: qty 1

## 2018-05-01 MED ORDER — POTASSIUM CHLORIDE CRYS ER 20 MEQ PO TBCR
40.0000 meq | EXTENDED_RELEASE_TABLET | Freq: Once | ORAL | Status: AC
  Administered 2018-05-01: 40 meq via ORAL
  Filled 2018-05-01: qty 2

## 2018-05-01 MED ORDER — POTASSIUM CHLORIDE CRYS ER 20 MEQ PO TBCR
20.0000 meq | EXTENDED_RELEASE_TABLET | Freq: Three times a day (TID) | ORAL | Status: AC
  Administered 2018-05-02 (×3): 20 meq via ORAL
  Filled 2018-05-01 (×3): qty 1

## 2018-05-01 MED ORDER — LORAZEPAM 2 MG PO TABS
2.0000 mg | ORAL_TABLET | Freq: Four times a day (QID) | ORAL | Status: DC | PRN
  Administered 2018-05-02: 2 mg via ORAL
  Filled 2018-05-01: qty 1
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
## 2018-05-01 NOTE — ED Notes (Addendum)

## 2018-05-01 NOTE — ED Notes (Signed)
BEHAVIORAL HEALTH ROUNDING Patient sleeping: No. Patient alert and oriented: yes Behavior appropriate: Yes.  ; If no, describe:  Nutrition and fluids offered: yes Toileting and hygiene offered: Yes  Sitter present: q15 minute observations and security  monitoring Law enforcement present: Yes  ODS  
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
## 2018-05-01 NOTE — BH Assessment (Signed)
Assessment Note  [name] is an 28 y.o. female. Group home called the police saying pt. was aggressive to staff (group home mgr.) and she was suicidal. Pt. Reports she is going to hurt the group home mgr. Pt. States "she does not know what is going on in my head, and it is not good, I'm going to hurt her if she don't get out my face". Pt. lives at group home on [name] through [name]. Pt reports living at this group home for three months and reports asking her ACT team to find her somewhere safe to live  Pt verbalizes that she does not feel safe at this group home   Pt reports [name] group home owner is verbally aggressive with her.  She feels she needs to be inpatient and go to a specific place in [name].  She states she is SI. She reports her roommate continues to play the music loud in the room and it driving her crazy. She denies any specific medical complaints.  She denies that she took anything during this episode.  No alcohol and no drugs.  She does smoke cigarettes.  No A/V/H. Pt. Has ACT team ([name]), [name] is her counselor. Pt. reports calling ACT team everyday, but not getting any help for moving to another group home. During assessment pt. became very agitated, upset, and irritated as she is with the group home manager [name]) Starting crying uncontrollable and got very loud as she tried to explain. Patient alert to time, place, and situation.    Diagnosis: Anxiety, Bipolar Disorder, Depression   Past Medical History:  Past Medical History:  Diagnosis Date  . Anxiety   . Bipolar 1 disorder ([place])   . Cognitive deficits   . Depression   . Diabetes mellitus without complication ([place])   . Hypertension   . Mental disorder   . Obesity     Past Surgical History:  Procedure Laterality Date  . CESAREAN SECTION    . CESAREAN SECTION N/A 04/25/2013   Procedure: REPEAT CESAREAN SECTION;  Surgeon: [name], MD;  Location: [facility];   Service: Obstetrics;  Laterality: N/A;  . MASS EXCISION N/A 06/03/2012   Procedure: EXCISION MASS;  Surgeon: [name], MD;  Location: [facility];  Service: ENT;  Laterality: N/A;  Excision uvula mass  . TONSILLECTOMY N/A 06/03/2012   Procedure: TONSILLECTOMY;  Surgeon: [name], MD;  Location: [facility];  Service: ENT;  Laterality: N/A;  . TONSILLECTOMY      Family History:  Family History  Problem Relation Age of Onset  . Hypertension Mother   . Diabetes Father     Social History:  reports that she has been smoking cigarettes. She has a 33.00 pack-year smoking history. She has never used smokeless tobacco. She reports that she does not drink alcohol or use drugs.  Additional Social History:  Alcohol / Drug Use Pain Medications: SEE MAR Prescriptions: SEE MAR Over the Counter: SEE MAR History of alcohol / drug use?: No history of alcohol / drug abuse Longest period of sobriety (when/how long): NA Withdrawal Symptoms: Cramps  CIWA: CIWA-Ar BP: (!) 148/85 Pulse Rate: (!) 101 COWS:    Allergies:  Allergies  Allergen Reactions  . Wellbutrin [Bupropion] Shortness Of Breath  . Omnipaque [Iohexol] Swelling and Other (See Comments)    Reaction:  Eye swelling  . Penicillins Hives and Other (See Comments)    Has patient had a PCN reaction  causing immediate rash, facial/tongue/throat swelling, SOB or lightheadedness with hypotension: No Has patient had a PCN reaction causing severe rash involving mucus membranes or skin necrosis: No Has patient had a PCN reaction that required hospitalization No Has patient had a PCN reaction occurring within the last 10 years: No If all of the above answers are "NO", then may proceed with Cephalosporin use.  [name] [Benztropine]     Make pt feel crazy  . Depakote Er [Divalproex Sodium Er] Nausea And Vomiting    Home Medications: (Not in a hospital admission)   OB/GYN Status:  No LMP recorded.  Patient has had an implant.  General Assessment Data Assessment unable to be completed: (NA) Location of Assessment: [name] [name] Assessment: In system Is this a Tele or Face-to-Face Assessment?: Face-to-Face Is this an Initial Assessment or a Re-assessment for this encounter?: Initial Assessment Patient Accompanied by:: N/A Language Other than English: No Living Arrangements: In Group Home: (Comment: Name of [name]) What gender do you identify as?: Female Marital status: Single Maiden name: NA Pregnancy Status: Unable to assess Living Arrangements: Group Home([name]) Can pt return to current living arrangement?: Yes Admission Status: Involuntary Petitioner: Police Is patient capable of signing voluntary admission?: Yes Referral Source: Psychiatrist Insurance type: Medicaid  Medical Screening Exam ([name]) Medical Exam completed: Yes  Crisis Care Plan Living Arrangements: Group Home([name]) Name of Psychiatrist: None Noted Name of Therapist: [name] Counselor  Education Status Is patient currently in school?: No  Risk to self with the past 6 months Suicidal Ideation: Yes-Currently Present Has patient been a risk to self within the past 6 months prior to admission? : Yes Suicidal Intent: Yes-Currently Present Has patient had any suicidal intent within the past 6 months prior to admission? : Yes Is patient at risk for suicide?: Yes Suicidal Plan?: No Has patient had any suicidal plan within the past 6 months prior to admission? : Yes Access to Means: No What has been your use of drugs/alcohol within the last 12 months?: No(Patient does not use drugs or alcohol) Previous Attempts/Gestures: Yes How many times?: 4 Other Self Harm Risks: yes Triggers for Past Attempts: Other personal contacts, Unpredictable Intentional Self Injurious Behavior: None Family Suicide History: Unknown Recent stressful life  event(s): Conflict (Comment)(Arguments with group home manager out of control) Persecutory voices/beliefs?: No Depression: Yes Depression Symptoms: Tearfulness, Loss of interest in usual pleasures, Feeling worthless/self pity, Feeling angry/irritable Substance abuse history and/or treatment for substance abuse?: No Suicide prevention information given to non-admitted patients: Not applicable  Risk to Others within the past 6 months Homicidal Ideation: Yes-Currently Present Does patient have any lifetime risk of violence toward others beyond the six months prior to admission? : Yes (comment) Thoughts of Harm to Others: Yes-Currently Present(Patient states "I might hurt her (group home mgr.) bad") Comment - Thoughts of Harm to Others: (Pt. very angry at group home Mgr. wants to hurt that person) Current Homicidal Intent: Yes-Currently Present Current Homicidal Plan: No Access to Homicidal Means: No Identified Victim: group home manager History of harm to others?: No Assessment of Violence: None Noted Violent Behavior Description: none observed Does patient have access to weapons?: No Criminal Charges Pending?: No Describe Pending Criminal Charges: none Does patient have a court date: No Is patient on probation?: No  Psychosis Hallucinations: None noted Delusions: None noted  Mental Status Report Appearance/Hygiene: Bizarre, In scrubs Eye Contact: Poor Motor Activity: Mannerisms, Restlessness, Rigidity, Shuffling,  Unsteady, Gestures, Agitation Speech: Aggressive, Argumentative, Rapid, Loud, Abusive Level of Consciousness: Combative, Crying, Irritable Mood: Angry, Irritable, Terrified, Worthless, low self-esteem, Threatening Affect: Angry, Frightened, Irritable, Threatening Anxiety Level: Severe Thought Processes: Irrelevant, Circumstantial Judgement: Unimpaired Orientation: Person, Place, Time, Situation Obsessive Compulsive Thoughts/Behaviors: None  Cognitive  Functioning Concentration: Poor Memory: Remote Intact Is patient IDD: No Insight: Poor Impulse Control: Poor Appetite: Fair Have you had any weight changes? : No Change Sleep: Decreased Total Hours of Sleep: 4 Vegetative Symptoms: None  ADLScreening [name]) Patient's cognitive ability adequate to safely complete daily activities?: Yes Patient able to express need for assistance with ADLs?: No Independently performs ADLs?: Yes (appropriate for developmental age)  Prior Inpatient Therapy Prior Inpatient Therapy: Colin Broach, [facility]) Prior Therapy Dates: 2017, 2018 Prior Therapy Facilty/Provider(s): [facility] Reason for Treatment: SI   Prior Outpatient Therapy Prior Outpatient Therapy: Yes Prior Therapy Dates: 2016 and 2017 Prior Therapy Facilty/Provider(s): [facility] Reason for Treatment: Sepression and Schizophrenia Does patient have an ACCT team?: Yes([facility] [place]) Does patient have Intensive In-House Services?  : No Does patient have [facility] services? : No Does patient have P4CC services?: No  ADL Screening (condition at time of admission) Patient's cognitive ability adequate to safely complete daily activities?: Yes Is the patient deaf or have difficulty hearing?: No Does the patient have difficulty seeing, even when wearing glasses/contacts?: No Does the patient have difficulty concentrating, remembering, or making decisions?: No Patient able to express need for assistance with ADLs?: No Does the patient have difficulty dressing or bathing?: No Independently performs ADLs?: Yes (appropriate for developmental age) Does the patient have difficulty walking or climbing stairs?: No Weakness of Legs: None Weakness of Arms/Hands: None  Home Assistive Devices/Equipment Home Assistive Devices/Equipment: None  Therapy Consults (therapy consults require a physician order) PT Evaluation Needed: No OT Evalulation Needed:  No SLP Evaluation Needed: No Abuse/Neglect Assessment (Assessment to be complete while patient is alone) Abuse/Neglect Assessment Can Be Completed: Unable to assess, patient is non-responsive or altered mental status Values / Beliefs Cultural Requests During Hospitalization: None Spiritual Requests During Hospitalization: None Consults Spiritual Care Consult Needed: No Social Work Consult Needed: No Regulatory affairs officer (For Healthcare) Does Patient Have a Medical Advance Directive?: No Would patient like information on creating a medical advance directive?: No - Patient declined       Child/Adolescent Assessment Running Away Risk: Denies Bed-Wetting: Denies Destruction of Property: Denies Cruelty to Animals: Denies Stealing: Denies Rebellious/Defies Authority: Denies Satanic Involvement: Denies Science writer: Denies Problems at [employer]: Denies Gang Involvement: Denies  Disposition:  Disposition Initial Assessment Completed for this Encounter: Yes Patient referred to: Other (Comment)([facility])  On Site Evaluation by:   Reviewed with Physician:    Rosette Bellavance R 05/01/2018 5:42 PM

## 2018-05-02 ENCOUNTER — Inpatient Hospital Stay
Admission: AD | Admit: 2018-05-02 | Discharge: 2018-05-06 | DRG: 885 | Disposition: A | Discharge status: Home / Self Care | Payer: Medicaid Other | Attending: Psychiatry | Admitting: Psychiatry

## 2018-05-02 ENCOUNTER — Encounter: Payer: Self-pay | Admitting: Psychiatry

## 2018-05-02 ENCOUNTER — Other Ambulatory Visit: Payer: Self-pay

## 2018-05-02 DIAGNOSIS — Z818 Family history of other mental and behavioral disorders: Secondary | ICD-10-CM | POA: Diagnosis not present

## 2018-05-02 DIAGNOSIS — E119 Type 2 diabetes mellitus without complications: Secondary | ICD-10-CM

## 2018-05-02 DIAGNOSIS — R45851 Suicidal ideations: Secondary | ICD-10-CM | POA: Diagnosis present

## 2018-05-02 DIAGNOSIS — F70 Mild intellectual disabilities: Secondary | ICD-10-CM | POA: Diagnosis present

## 2018-05-02 DIAGNOSIS — Z794 Long term (current) use of insulin: Secondary | ICD-10-CM | POA: Diagnosis not present

## 2018-05-02 DIAGNOSIS — F17210 Nicotine dependence, cigarettes, uncomplicated: Secondary | ICD-10-CM | POA: Diagnosis present

## 2018-05-02 DIAGNOSIS — F314 Bipolar disorder, current episode depressed, severe, without psychotic features: Principal | ICD-10-CM | POA: Diagnosis present

## 2018-05-02 DIAGNOSIS — R4183 Borderline intellectual functioning: Secondary | ICD-10-CM

## 2018-05-02 DIAGNOSIS — Z79899 Other long term (current) drug therapy: Secondary | ICD-10-CM | POA: Diagnosis not present

## 2018-05-02 DIAGNOSIS — Z915 Personal history of self-harm: Secondary | ICD-10-CM | POA: Diagnosis not present

## 2018-05-02 DIAGNOSIS — Z833 Family history of diabetes mellitus: Secondary | ICD-10-CM

## 2018-05-02 DIAGNOSIS — F17200 Nicotine dependence, unspecified, uncomplicated: Secondary | ICD-10-CM | POA: Diagnosis present

## 2018-05-02 DIAGNOSIS — F429 Obsessive-compulsive disorder, unspecified: Secondary | ICD-10-CM | POA: Diagnosis present

## 2018-05-02 DIAGNOSIS — F819 Developmental disorder of scholastic skills, unspecified: Secondary | ICD-10-CM | POA: Diagnosis not present

## 2018-05-02 DIAGNOSIS — I10 Essential (primary) hypertension: Secondary | ICD-10-CM | POA: Diagnosis present

## 2018-05-02 DIAGNOSIS — F411 Generalized anxiety disorder: Secondary | ICD-10-CM | POA: Diagnosis not present

## 2018-05-02 DIAGNOSIS — F639 Impulse disorder, unspecified: Secondary | ICD-10-CM | POA: Diagnosis not present

## 2018-05-02 DIAGNOSIS — F3132 Bipolar disorder, current episode depressed, moderate: Secondary | ICD-10-CM | POA: Diagnosis not present

## 2018-05-02 LAB — BASIC METABOLIC PANEL
ANION GAP: 8 (ref 5–15)
BUN: 8 mg/dL (ref 6–20)
CHLORIDE: 104 mmol/L (ref 98–111)
CO2: 28 mmol/L (ref 22–32)
CREATININE: 0.78 mg/dL (ref 0.44–1.00)
Calcium: 9.3 mg/dL (ref 8.9–10.3)
GFR calc non Af Amer: >60 mL/min (ref >60)
Glucose, Bld: 183 mg/dL — ABNORMAL HIGH (ref 70–99)
Potassium: 3.4 mmol/L — ABNORMAL LOW (ref 3.5–5.1)
SODIUM: 140 mmol/L (ref 135–145)

## 2018-05-02 LAB — GLUCOSE, CAPILLARY: Glucose-Capillary: 228 mg/dL — ABNORMAL HIGH (ref 70–99)

## 2018-05-02 MED ORDER — BUSPIRONE HCL 10 MG PO TABS
15.0000 mg | ORAL_TABLET | Freq: Three times a day (TID) | ORAL | Status: DC
  Administered 2018-05-02 (×2): 15 mg via ORAL
  Filled 2018-05-02 (×2): qty 2

## 2018-05-02 MED ORDER — HYDROCHLOROTHIAZIDE 25 MG PO TABS
25.0000 mg | ORAL_TABLET | Freq: Every day | ORAL | Status: DC
  Administered 2018-05-02: 25 mg via ORAL
  Filled 2018-05-02: qty 1

## 2018-05-02 MED ORDER — ARIPIPRAZOLE ER 400 MG IM PRSY
400.0000 mg | PREFILLED_SYRINGE | INTRAMUSCULAR | Status: DC
  Administered 2018-05-02: 400 mg via INTRAMUSCULAR
  Filled 2018-05-02 (×2): qty 400

## 2018-05-02 MED ORDER — LITHIUM CARBONATE ER 300 MG PO TBCR
300.0000 mg | EXTENDED_RELEASE_TABLET | Freq: Two times a day (BID) | ORAL | Status: DC
  Administered 2018-05-02: 300 mg via ORAL
  Filled 2018-05-02 (×2): qty 1

## 2018-05-02 MED ORDER — MAGNESIUM HYDROXIDE 400 MG/5ML PO SUSP: 30.0000 mL | Freq: Every day | ORAL | Status: DC | PRN

## 2018-05-02 MED ORDER — ALUM & MAG HYDROXIDE-SIMETH 200-200-20 MG/5ML PO SUSP: 30.0000 mL | ORAL | Status: DC | PRN

## 2018-05-02 MED ORDER — BUSPIRONE HCL 15 MG PO TABS
15.0000 mg | ORAL_TABLET | Freq: Three times a day (TID) | ORAL | Status: DC
  Administered 2018-05-02 – 2018-05-03 (×3): 15 mg via ORAL
  Filled 2018-05-02 (×5): qty 1

## 2018-05-02 MED ORDER — INSULIN DETEMIR 100 UNIT/ML ~~LOC~~ SOLN
40.0000 [IU] | Freq: Two times a day (BID) | SUBCUTANEOUS | Status: DC
  Administered 2018-05-02: 40 [IU] via SUBCUTANEOUS
  Filled 2018-05-02 (×4): qty 0.4

## 2018-05-02 MED ORDER — LORAZEPAM 2 MG PO TABS
2.0000 mg | ORAL_TABLET | Freq: Four times a day (QID) | ORAL | Status: DC | PRN
  Administered 2018-05-02: 2 mg via ORAL
  Filled 2018-05-02: qty 1

## 2018-05-02 MED ORDER — LITHIUM CARBONATE ER 300 MG PO TBCR
300.0000 mg | EXTENDED_RELEASE_TABLET | Freq: Two times a day (BID) | ORAL | Status: DC
  Administered 2018-05-02 – 2018-05-06 (×8): 300 mg via ORAL
  Filled 2018-05-02 (×8): qty 1

## 2018-05-02 MED ORDER — INSULIN DETEMIR 100 UNIT/ML ~~LOC~~ SOLN
40.0000 [IU] | Freq: Two times a day (BID) | SUBCUTANEOUS | Status: DC
  Administered 2018-05-02 – 2018-05-06 (×8): 40 [IU] via SUBCUTANEOUS
  Filled 2018-05-02 (×9): qty 0.4

## 2018-05-02 MED ORDER — NICOTINE 14 MG/24HR TD PT24
14.0000 mg | MEDICATED_PATCH | Freq: Every day | TRANSDERMAL | Status: DC
  Filled 2018-05-02 (×2): qty 1

## 2018-05-02 MED ORDER — ACETAMINOPHEN 325 MG PO TABS: 650.0000 mg | ORAL_TABLET | Freq: Four times a day (QID) | ORAL | Status: DC | PRN

## 2018-05-02 NOTE — ED Notes (Signed)
Report to include Situation, Background, Assessment, and Recommendations received from Amy B. RN. Patient alert and oriented, warm and dry, in no acute distress. Patient denies HI, AVH and pain. Patient states she has SI with plan to "OD on Meds. Patient made aware of Q15 minute rounds and security cameras for their safety. Patient instructed to come to me with needs or concerns.

## 2018-05-02 NOTE — Progress Notes (Signed)
D: Received patient from [facility] Emergency Department. Patient skin assessment completed with Veronique, RN, skin is intact, no contraband found. All unit prohibited items locked and stored away for discharge. Pt. Was admitted under the services of, Dr. Bary Leriche.   Patient during the admissions process is pleasant and cooperative. Pt. Was able to be complaint with the admissions process and signing paperwork.   Pt. During interviewing reports that she has been having a difficult time with her living situation at her group home here in [place] and this has been causing her to feel suicidal. Pt. Expresses frequently during our conversation that her group home is, "just crazy". Pt. When asked if she is feeling suicidal or homicidal denies homicidal ideations, but expresses active suicidal ideations with a some-what specific plan. Pt. Expresses, "I wanted to take all the medications locked up at my group home, but I couldn't get to them". Pt. When asked if she can remain safe while in the hospital and contract for safety, verbalizes contract for safety and reports she can remain safe while on the unit. Pt. Educated to come to staff if she feels the urge to self-harm, so that staff can intervene for safety of the patient. Pt. Also placed close to the nursing station for additional precautions. Pt. Given extensive admissions education that the patient verbalizes understanding, but will need reinforcement of this. Pt. Expresses a depressed and anxious mood that the patient is given PRN medications for. Pt. Presentation is bizarre and seems preoccupied at times and paranoid. The patient's facial expression is animated and wide-eyed. Pt. Denies AVH. Pt. Denies pain.    A: Patient oriented to unit/room/call light. Patient was encourage to participate in unit activities and continue with plan of care being put into place. Q x 15 minute observation checks were initiated for safety. Pt. Given snacks and  fluids to drink.   R: Patient is receptive to initial treatment plan being put into place and safety to be maintained on unit.

## 2018-05-02 NOTE — ED Notes (Signed)
IVC/Pending Placement/ Pt moved to B[facility]-5

## 2018-05-02 NOTE — Tx Team (Signed)
Initial Treatment Plan 05/02/2018 11:03 PM Chen [name] TOI:[id number]    PATIENT STRESSORS: Medication change or noncompliance Other: suicidal thoughts/ideation, depression, anxiety, Housing   PATIENT STRENGTHS: Ability for insight Communication skills Motivation for treatment/growth   PATIENT IDENTIFIED PROBLEMS: Suicidal Ideations 05/02/18  Depression 05/02/18  Anxiety 05/02/18  Psychosis 05/02/18               DISCHARGE CRITERIA:  Adequate post-discharge living arrangements Improved stabilization in mood, thinking, and/or behavior Motivation to continue treatment in a less acute level of care Need for constant or close observation no longer present Safe-care adequate arrangements made Verbal commitment to aftercare and medication compliance  PRELIMINARY DISCHARGE PLAN: Outpatient therapy Placement in alternative living arrangements  PATIENT/FAMILY INVOLVEMENT: This treatment plan has been presented to and reviewed with the patient, [name].  The patient has been given the opportunity to ask questions and make suggestions.  Reyes Ivan, RN 05/02/2018, 11:03 PM

## 2018-05-02 NOTE — BH Assessment (Signed)
Patient is to be admitted to [facility] by Dr. Leverne Humbles.  Attending Physician will be Dr. Bary Leriche.   Patient has been assigned to room 301, by [place] Nurse [place].   Intake Paper Work has been signed and placed on patient chart.  ER staff is aware of the admission:  Lattie Haw, ER Secretary    Dr. Cinda Quest, ER MD   Amy B., Patient's Nurse   Johnnye Lana, Patient Access.

## 2018-05-02 NOTE — ED Notes (Signed)
Patient asleep in room. No noted distress or abnormal behavior. Will continue 15 minute checks and observation by security cameras for safety. 

## 2018-05-02 NOTE — ED Notes (Signed)
Hourly rounding reveals patient in room. No complaints, stable, in no acute distress. Q15 minute rounds and monitoring via Security Cameras to continue. 

## 2018-05-02 NOTE — Plan of Care (Signed)
Patient just recently admitted to the unit. Patient has not had sufficient time to show progressions at this time. Will continue to monitor for progressions.    Problem: Education: Goal: Knowledge of [facility] Education information/materials will improve Outcome: Not Progressing Goal: Emotional status will improve Outcome: Not Progressing Goal: Mental status will improve Outcome: Not Progressing Goal: Verbalization of understanding the information provided will improve Outcome: Not Progressing   Problem: Activity: Goal: Interest or engagement in activities will improve Outcome: Not Progressing Goal: Sleeping patterns will improve Outcome: Not Progressing   Problem: Coping: Goal: Ability to verbalize frustrations and anger appropriately will improve Outcome: Not Progressing Goal: Ability to demonstrate self-control will improve Outcome: Not Progressing   Problem: Health Behavior/Discharge Planning: Goal: Identification of resources available to assist in meeting health care needs will improve Outcome: Not Progressing Goal: Compliance with treatment plan for underlying cause of condition will improve Outcome: Not Progressing   Problem: Physical Regulation: Goal: Ability to maintain clinical measurements within normal limits will improve Outcome: Not Progressing   Problem: Safety: Goal: Periods of time without injury will increase Outcome: Not Progressing   Problem: Coping: Goal: Coping ability will improve Outcome: Not Progressing   Problem: Medication: Goal: Compliance with prescribed medication regimen will improve Outcome: Not Progressing   Problem: Self-Concept: Goal: Ability to disclose and discuss suicidal ideas will improve Outcome: Not Progressing   Problem: Education: Goal: Ability to incorporate positive changes in behavior to improve self-esteem will improve Outcome: Not Progressing   Problem: Education: Goal: Will be free of psychotic  symptoms Outcome: Not Progressing   Problem: Safety: Goal: Ability to redirect hostility and anger into socially appropriate behaviors will improve Outcome: Not Progressing Goal: Ability to remain free from injury will improve Outcome: Not Progressing

## 2018-05-02 NOTE — Progress Notes (Signed)
[facility] MD Progress Note  05/02/2018 12:59 PM [name]  MRN:  [id number] Subjective:  "I feel like I am going to kill myself or someone else if I can't move" Principal Problem: <principal problem not specified> Diagnosis: Principal Problem:   Moderate bipolar I disorder, most recent episode depressed ([place]) Active Problems:   Generalized anxiety disorder   Learning disability   Impulse control disorder   Diabetes mellitus ([place])  Total Time spent with patient: 45 minutes  Past Psychiatric History: [name] is a 28 y.o. female with a history of bipolar 1 disorder, borderline intellectual functioning, impulse control disorder, generalized anxiety disorder who presents for thoughts of self-harm to burn self with her lighter and suicidal ideation.  Patient has chronic suicidal ideation in relationship to paranoia of "people out to get her."   Patient had been living independently with the care of an ACT team however due to frequent hospitalizations with suicidal ideation she was transitioned into a group home which she has been living in for the past 3 months.  Patient reports that her suicidal thoughts when she lived alone were from people on the bus picking on her.  She describes that since moving into the group home she cannot sleep due to the noise, and she believes that other residents were intentionally antagonizing her because "they knew that I was due for my Abilify injection soon."  Patient describes the only time she has not felt suicidal as when she lived with her mother, however "my sister, who is my mom's favorite gets to stay there, and I cannot."  Patient states that she does not think her medications help her.  She reports Abilify and Lithobid are for her depression, but do not work.  She states BuSpar is for her anxiety which "helps a little."  She does not recall being on antidepressants in the past.  Patient reports last self-harm was approximately 3 years ago when she did burn  herself with a lighter.  Patient has a suicide attempt by overdose 1 year ago.  She states she continues to have regular thoughts about burning herself in the context of having to return to her group home.  She is frustrated that her case manager from her ACT team, [facility] has not been able to find her a new place to live. Patient becomes tearful regarding the lack of contact she has with her family.  She states that she has " 3 daughters, ages 29, 55, 79 who are in adoption". Patient becomes visibly agitated and threatening to this writer when discussing possible discharge.  She raises her voice, Producer, television/film/video in a threatening way, while speaking with a loud voice and crying.  Patient is redirectable, and willing to take medications to calm down.  She requests getting her Abilify Maintenna injection as she missed dose on Friday. Group home was contacted who confirms patient was due for Abilify Maintenna injection on Friday, however she was "out on a date." Patient has reported that she has no activities arranged for her while residing in the group home, and she reports she would like to have a program she could go to.   Multiple attempts to contact patient's ACT team have been made, with no return call at this time.  Past Medical History:  Past Medical History:  Diagnosis Date  . Anxiety   . Bipolar 1 disorder ([place])   . Cognitive deficits   . Depression   . Diabetes mellitus without complication ([place])   .  Hypertension   . Mental disorder   . Obesity     Past Surgical History:  Procedure Laterality Date  . CESAREAN SECTION    . CESAREAN SECTION N/A 04/25/2013   Procedure: REPEAT CESAREAN SECTION;  Surgeon: [name], MD;  Location: [facility];  Service: Obstetrics;  Laterality: N/A;  . MASS EXCISION N/A 06/03/2012   Procedure: EXCISION MASS;  Surgeon: [name], MD;  Location: [facility];  Service: ENT;  Laterality: N/A;  Excision uvula mass  . TONSILLECTOMY N/A  06/03/2012   Procedure: TONSILLECTOMY;  Surgeon: [name], MD;  Location: [facility];  Service: ENT;  Laterality: N/A;  . TONSILLECTOMY     Family History:  Family History  Problem Relation Age of Onset  . Hypertension Mother   . Diabetes Father    Family Psychiatric  History: Unknown Social History:  Social History   Substance and Sexual Activity  Alcohol Use No     Social History   Substance and Sexual Activity  Drug Use No   Comment: Patient denies    Social History   Socioeconomic History  . Marital status: Single    Spouse name: Not on file  . Number of children: Not on file  . Years of education: Not on file  . Highest education level: Not on file  Occupational History  . Not on file  Social Needs  . Financial resource strain: Not on file  . Food insecurity:    Worry: Not on file    Inability: Not on file  . Transportation needs:    Medical: Not on file    Non-medical: Not on file  Tobacco Use  . Smoking status: Current Every Day Smoker    Packs/day: 3.00    Years: 11.00    Pack years: 33.00    Types: Cigarettes  . Smokeless tobacco: Never Used  Substance and Sexual Activity  . Alcohol use: No  . Drug use: No    Comment: Patient denies  . Sexual activity: Yes    Birth control/protection: Implant  Lifestyle  . Physical activity:    Days per week: Not on file    Minutes per session: Not on file  . Stress: Not on file  Relationships  . Social connections:    Talks on phone: Not on file    Gets together: Not on file    Attends religious service: Not on file    Active member of club or organization: Not on file    Attends meetings of clubs or organizations: Not on file    Relationship status: Not on file  Other Topics Concern  . Not on file  Social History Narrative  . Not on file   Additional Social History:  Patient's mother is guardian. Patient currently lives in group home in which she is not happy.   Pain  Medications: SEE MAR Prescriptions: SEE MAR Over the Counter: SEE MAR History of alcohol / drug use?: No history of alcohol / drug abuse Longest period of sobriety (when/how long): NA Withdrawal Symptoms: Cramps                    Sleep: Fair  Appetite:  Fair  Current Medications: Current Facility-Administered Medications  Medication Dose Route Frequency Provider Last Rate Last Dose  . ARIPiprazole ER (ABILIFY MAINTENA) 400 MG prefilled syringe 400 mg  400 mg Intramuscular Q30 days Darel Hong, MD   400 mg at 05/02/18 1111  . busPIRone (BUSPAR)  tablet 15 mg  15 mg Oral TID Darel Hong, MD   15 mg at 05/02/18 1030  . hydrochlorothiazide (HYDRODIURIL) tablet 25 mg  25 mg Oral Daily Darel Hong, MD   25 mg at 05/02/18 1031  . insulin detemir (LEVEMIR) injection 40 Units  40 Units Subcutaneous BID Darel Hong, MD   40 Units at 05/02/18 1031  . lithium carbonate (LITHOBID) CR tablet 300 mg  300 mg Oral BID Darel Hong, MD   300 mg at 05/02/18 1031  . LORazepam (ATIVAN) tablet 2 mg  2 mg Oral Q6H PRN Arta Silence, MD   2 mg at 05/02/18 1031  . potassium chloride SA (K-DUR,KLOR-CON) CR tablet 20 mEq  20 mEq Oral TID Arta Silence, MD   20 mEq at 05/02/18 1031   Current Outpatient Medications  Medication Sig Dispense Refill  . ABILIFY MAINTENA 400 MG PRSY prefilled syringe Inject 400 mg as directed every 30 (thirty) days.  6  . busPIRone (BUSPAR) 15 MG tablet Take 1 tablet (15 mg total) by mouth 3 (three) times daily. 30 tablet 0  . hydrochlorothiazide (HYDRODIURIL) 25 MG tablet Take 25 mg by mouth daily.    . insulin detemir (LEVEMIR) 100 UNIT/ML injection Inject 40 Units into the skin 2 (two) times daily.    Marland Kitchen lithium carbonate (LITHOBID) 300 MG CR tablet Take 300 mg by mouth 2 (two) times daily.   6  . nicotine polacrilex (NICORETTE) 2 MG gum Take 1 each (2 mg total) by mouth every hour as needed for smoking cessation. (Patient not taking: Reported on  05/02/2018) 100 tablet 0    Lab Results:  Results for orders placed or performed during the hospital encounter of 05/01/18 (from the past 48 hour(s))  Urine Drug Screen, Qualitative     Status: None   Collection Time: 05/01/18  3:40 PM  Result Value Ref Range   Tricyclic, Ur Screen NONE DETECTED NONE DETECTED   Amphetamines, Ur Screen NONE DETECTED NONE DETECTED   MDMA (Ecstasy)Ur Screen NONE DETECTED NONE DETECTED   Cocaine Metabolite,Ur [place] NONE DETECTED NONE DETECTED   Opiate, Ur Screen NONE DETECTED NONE DETECTED   Phencyclidine (PCP) Ur S NONE DETECTED NONE DETECTED   Cannabinoid 50 Ng, Ur [place] NONE DETECTED NONE DETECTED   Barbiturates, Ur Screen NONE DETECTED NONE DETECTED   Benzodiazepine, Ur Scrn NONE DETECTED NONE DETECTED   Methadone Scn, Ur NONE DETECTED NONE DETECTED    Comment: (NOTE) Tricyclics + metabolites, urine    Cutoff 1000 ng/mL Amphetamines + metabolites, urine  Cutoff 1000 ng/mL MDMA (Ecstasy), urine              Cutoff 500 ng/mL Cocaine Metabolite, urine          Cutoff 300 ng/mL Opiate + metabolites, urine        Cutoff 300 ng/mL Phencyclidine (PCP), urine         Cutoff 25 ng/mL Cannabinoid, urine                 Cutoff 50 ng/mL Barbiturates + metabolites, urine  Cutoff 200 ng/mL Benzodiazepine, urine              Cutoff 200 ng/mL Methadone, urine                   Cutoff 300 ng/mL The urine drug screen provides only a preliminary, unconfirmed analytical test result and should not be used for non-medical purposes. Clinical consideration and professional judgment should be applied to  any positive drug screen result due to possible interfering substances. A more specific alternate chemical method must be used in order to obtain a confirmed analytical result. Gas chromatography / mass spectrometry (GC/MS) is the preferred confirmat ory method. Performed at [facility], [address] [place], [place] [address]   Comprehensive metabolic panel      Status: Abnormal   Collection Time: 05/01/18  3:52 PM  Result Value Ref Range   Sodium 140 135 - 145 mmol/L   Potassium 2.6 (LL) 3.5 - 5.1 mmol/L    Comment: CRITICAL RESULT CALLED TO, READ BACK BY AND VERIFIED WITH AMY TEAGUE AT 1708 ON 05/01/2018 JJB    Chloride 103 98 - 111 mmol/L   CO2 28 22 - 32 mmol/L   Glucose, Bld 182 (H) 70 - 99 mg/dL   BUN 10 6 - 20 mg/dL   Creatinine, Ser 0.79 0.44 - 1.00 mg/dL   Calcium 10.0 8.9 - 10.3 mg/dL   Total Protein 8.2 (H) 6.5 - 8.1 g/dL   Albumin 4.7 3.5 - 5.0 g/dL   AST 28 15 - 41 U/L   ALT 22 0 - 44 U/L   Alkaline Phosphatase 48 38 - 126 U/L   Total Bilirubin 0.9 0.3 - 1.2 mg/dL   GFR calc non Af Amer >60 >60 mL/min   GFR calc Af Amer >60 >60 mL/min   Anion gap 9 5 - 15    Comment: Performed at [facility], [address] [place], [place] [address]  Ethanol     Status: None   Collection Time: 05/01/18  3:52 PM  Result Value Ref Range   Alcohol, Ethyl (B) <10 <10 mg/dL    Comment: (NOTE) Lowest detectable limit for serum alcohol is 10 mg/dL. For medical purposes only. Performed at [facility], [address] [place], [place] [address]   Salicylate level     Status: None   Collection Time: 05/01/18  3:52 PM  Result Value Ref Range   Salicylate Lvl <9.1 2.8 - 30.0 mg/dL    Comment: Performed at [facility], [address] [place], [place] [address]  Acetaminophen level     Status: Abnormal   Collection Time: 05/01/18  3:52 PM  Result Value Ref Range   Acetaminophen (Tylenol), Serum <10 (L) 10 - 30 ug/mL    Comment: (NOTE) Therapeutic concentrations vary significantly. A range of 10-30 ug/mL  may be an effective concentration for many patients. However, some  are best treated at concentrations outside of this range. Acetaminophen concentrations >150 ug/mL at 4 hours after ingestion  and >50 ug/mL at 12 hours after ingestion are often associated with  toxic reactions. Performed at [facility], [address] [place], [place] [address]   cbc     Status: Abnormal   Collection Time: 05/01/18  3:52 PM  Result Value Ref Range   WBC 12.9 (H) 4.0 - 10.5 K/uL   RBC 4.79 3.87 - 5.11 MIL/uL   Hemoglobin 12.8 12.0 - 15.0 g/dL   HCT 40.3 36.0 - 46.0 %   MCV 84.1 80.0 - 100.0 fL   MCH 26.7 26.0 - 34.0 pg   MCHC 31.8 30.0 - 36.0 g/dL   RDW 13.2 11.5 - 15.5 %   Platelets 282 150 - 400 K/uL   nRBC 0.0 0.0 - 0.2 %    Comment: Performed at [facility], [address], [place], [place] [address]  Lithium level  Status: None   Collection Time: 05/01/18  3:52 PM  Result Value Ref Range   Lithium Lvl 0.60 0.60 - 1.20 mmol/L    Comment: Performed at [facility], [address] [place], [place] [address]  Glucose, capillary     Status: Abnormal   Collection Time: 05/01/18  4:18 PM  Result Value Ref Range   Glucose-Capillary 174 (H) 70 - 99 mg/dL    Blood Alcohol level:  Lab Results  Component Value Date   ETH <10 05/01/2018   ETH <10 60/73/7106    Metabolic Disorder Labs: Lab Results  Component Value Date   HGBA1C 9.1 (H) 12/01/2017   MPG 214.47 12/01/2017   MPG 177 07/18/2015   Lab Results  Component Value Date   PROLACTIN 36.7 (H) 12/01/2017   PROLACTIN 37.3 (H) 07/18/2015   Lab Results  Component Value Date   CHOL 253 (H) 12/01/2017   TRIG 319 (H) 12/01/2017   HDL 42 12/01/2017   CHOLHDL 6.0 12/01/2017   VLDL 64 (H) 12/01/2017   LDLCALC 147 (H) 12/01/2017   LDLCALC UNABLE TO CALCULATE IF TRIGLYCERIDE OVER 400 mg/dL 07/18/2015    Physical Findings: AIMS:  , ,  ,  ,    CIWA:    COWS:     Musculoskeletal: Strength & Muscle Tone: within normal limits Gait & Station: normal Patient leans: N/A  Psychiatric Specialty Exam: Physical Exam  Constitutional: She is oriented to person, place, and time. She appears well-developed and well-nourished.  HENT:  Head: Normocephalic and atraumatic.  Neck: Normal range of motion.   Respiratory: Effort normal.  Musculoskeletal: Normal range of motion.  Neurological: She is alert and oriented to person, place, and time.  Skin: Skin is warm and dry.    Review of Systems  Constitutional: Negative.   HENT: Negative.   Respiratory: Negative.   Cardiovascular: Negative.   Gastrointestinal: Negative.   Musculoskeletal: Negative.   Neurological: Negative.     Blood pressure (!) 148/85, pulse (!) 101, temperature 98.4 F (36.9 C), resp. rate 15, height 5\' 7"  (1.702 m), weight 108.9 kg, SpO2 98 %.Body mass index is 37.59 kg/m.  General Appearance: Casual  Eye Contact:  Good  Speech:  Clear and Coherent and Pressured  Volume:  Increased  Mood:  Angry, Anxious, Depressed, Hopeless and Irritable  Affect:  Labile  Thought Process:  Descriptions of Associations: Tangential  Orientation:  Full (Time, Place, and Person)  Thought Content:  Hallucinations: None, Paranoid Ideation and Rumination  Suicidal Thoughts:  Yes.  with intent/plan  Homicidal Thoughts:  Yes.  without intent/plan  Memory:  Immediate;   Fair Recent;   Fair Remote;   Fair  Judgement:  Poor  Insight:  Lacking  Psychomotor Activity:  Restlessness  Concentration:  Concentration: Fair and Attention Span: Fair  Recall:  [employer] of Knowledge:  Fair  Language:  Good  Akathisia:  No  Handed:  Right  AIMS (if indicated):     Assets:  Physical Health  ADL's:  Intact  Cognition:  Impaired,  Mild  Sleep:   adequate     Treatment Plan Summary: Daily contact with patient to assess and evaluate symptoms and progress in treatment and Medication management  Restarted home medications Abilify Maintenna 400 mg IM given today  Labs reviewed: Hypokalemia Recheck BMP today 3.4 Continue to monitor and replete as indicated.  Patient required as needed medication for agitation today.  She is unable to contract for safety.  ACT team has  not returned calls for collateral discharge planning. Due to instability  in living arrangements and patient's acute severe level of agitation requiring medications, will plan for inpatient psychiatric admission at this time.    Lavella Hammock, MD 05/02/2018, 12:59 PM

## 2018-05-02 NOTE — ED Notes (Signed)
Pt became agitated while doctor was interviewing patient.  Doctor reported that patient became loud and started yelling when doctor mentioned to the patient that she would have to return to her group home upon discharge.

## 2018-05-02 NOTE — ED Notes (Signed)
[facility]  PENDING  GOING  TO  [facility]  MED  TONIGHT 

## 2018-05-02 NOTE — Progress Notes (Signed)
Orders received by the on-call MD/ Attending, (see MAR).

## 2018-05-03 ENCOUNTER — Encounter: Payer: Self-pay | Admitting: Psychiatry

## 2018-05-03 DIAGNOSIS — F314 Bipolar disorder, current episode depressed, severe, without psychotic features: Principal | ICD-10-CM

## 2018-05-03 DIAGNOSIS — I10 Essential (primary) hypertension: Secondary | ICD-10-CM | POA: Diagnosis present

## 2018-05-03 DIAGNOSIS — F17200 Nicotine dependence, unspecified, uncomplicated: Secondary | ICD-10-CM | POA: Diagnosis present

## 2018-05-03 LAB — GLUCOSE, CAPILLARY
Glucose-Capillary: 125 mg/dL — ABNORMAL HIGH (ref 70–99)
Glucose-Capillary: 161 mg/dL — ABNORMAL HIGH (ref 70–99)
Glucose-Capillary: 167 mg/dL — ABNORMAL HIGH (ref 70–99)
Glucose-Capillary: 141 mg/dL — ABNORMAL HIGH (ref 70–99)

## 2018-05-03 MED ORDER — ARIPIPRAZOLE ER 400 MG IM SRER
400.0000 mg | INTRAMUSCULAR | Status: DC
  Filled 2018-05-03: qty 2

## 2018-05-03 MED ORDER — HYDROCHLOROTHIAZIDE 25 MG PO TABS
25.0000 mg | ORAL_TABLET | Freq: Every day | ORAL | Status: DC
  Administered 2018-05-03 – 2018-05-04 (×2): 25 mg via ORAL
  Filled 2018-05-03 (×2): qty 1

## 2018-05-03 MED ORDER — FLUVOXAMINE MALEATE 50 MG PO TABS
50.0000 mg | ORAL_TABLET | Freq: Every day | ORAL | Status: DC
  Administered 2018-05-03: 50 mg via ORAL
  Filled 2018-05-03: qty 1

## 2018-05-03 NOTE — [facility] Counselor (Signed)
CSW attempted to contact [facility] at 2:16PM regarding client being in hospital and desire to move to another group home.  CSW left HIPAA compliant voicemail and requested a call back.  Assunta Curtis, MSW, LCSW 05/03/2018 2:22 PM

## 2018-05-03 NOTE — Plan of Care (Signed)
Cooperative and compliant with treatment

## 2018-05-03 NOTE — [facility] Suicide Risk Assessment (Signed)
[facility] INPATIENT:  Family/Significant Other Suicide Prevention Education  Suicide Prevention Education:  Contact Attempts: Jacoya Bauman [phone], mother has been identified by the patient as the family member/significant other with whom the patient will be residing, and identified as the person(s) who will aid the patient in the event of a mental health crisis.  With written consent from the patient, two attempts were made to provide suicide prevention education, prior to and/or following the patient's discharge.  We were unsuccessful in providing suicide prevention education.  A suicide education pamphlet was given to the patient to share with family/significant other.  Date and time of first attempt: 05/03/2018 at 1:47pm Date and time of second attempt: second attempt is needed  Rozann Lesches 05/03/2018, 2:19 PM

## 2018-05-03 NOTE — Progress Notes (Addendum)
[facility] MD Progress Note  05/04/2018 11:37 AM [name]  MRN:  [id number]  Subjective:    [name] mt with treatment team. She is sad that we kept saying, she may not be placed in a new group home. [facility] has been working on it. She has no somatic complaints. She obsesses and wants to shave her hair all the time. She now claims that all her medications are useless and wants a change. Yesterday she was of an opposite persuasion.   Principal Problem: Bipolar I disorder, most recent episode depressed, severe without psychotic features ([name]) Diagnosis: Principal Problem:   Bipolar I disorder, most recent episode depressed, severe without psychotic features ([name]) Active Problems:   Borderline intellectual functioning   Diabetes mellitus ([place])   OCD (obsessive compulsive disorder)   HTN (hypertension)   Tobacco use disorder  Total Time spent with patient: 20 minutes  Past Psychiatric History: schizophrenia  Past Medical History:  Past Medical History:  Diagnosis Date  . Anxiety   . Bipolar 1 disorder ([place])   . Cognitive deficits   . Depression   . Diabetes mellitus without complication ([place])   . Hypertension   . Mental disorder   . Obesity     Past Surgical History:  Procedure Laterality Date  . CESAREAN SECTION    . CESAREAN SECTION N/A 04/25/2013   Procedure: REPEAT CESAREAN SECTION;  Surgeon: [name], MD;  Location: [facility];  Service: Obstetrics;  Laterality: N/A;  . MASS EXCISION N/A 06/03/2012   Procedure: EXCISION MASS;  Surgeon: [name], MD;  Location: [place];  Service: ENT;  Laterality: N/A;  Excision uvula mass  . TONSILLECTOMY N/A 06/03/2012   Procedure: TONSILLECTOMY;  Surgeon: [name], MD;  Location: [place];  Service: ENT;  Laterality: N/A;  . TONSILLECTOMY     Family History:  Family History  Problem Relation Age of Onset  . Hypertension Mother   . Diabetes Father    Family Psychiatric  History:  none Social History:  Social History   Substance and Sexual Activity  Alcohol Use No     Social History   Substance and Sexual Activity  Drug Use No   Comment: Patient denies    Social History   Socioeconomic History  . Marital status: Single    Spouse name: Not on file  . Number of children: Not on file  . Years of education: Not on file  . Highest education level: Not on file  Occupational History  . Not on file  Social Needs  . Financial resource strain: Not on file  . Food insecurity:    Worry: Not on file    Inability: Not on file  . Transportation needs:    Medical: Not on file    Non-medical: Not on file  Tobacco Use  . Smoking status: Current Every Day Smoker    Packs/day: 3.00    Years: 11.00    Pack years: 33.00    Types: Cigarettes  . Smokeless tobacco: Never Used  Substance and Sexual Activity  . Alcohol use: No  . Drug use: No    Comment: Patient denies  . Sexual activity: Yes    Birth control/protection: Implant  Lifestyle  . Physical activity:    Days per week: Not on file    Minutes per session: Not on file  . Stress: Not on file  Relationships  . Social connections:    Talks on phone: Not on file  Gets together: Not on file    Attends religious service: Not on file    Active member of club or organization: Not on file    Attends meetings of clubs or organizations: Not on file    Relationship status: Not on file  Other Topics Concern  . Not on file  Social History Narrative  . Not on file   Additional Social History:                         Sleep: Fair  Appetite:  Fair  Current Medications: Current Facility-Administered Medications  Medication Dose Route Frequency Provider Last Rate Last Dose  . acetaminophen (TYLENOL) tablet 650 mg  650 mg Oral Q6H PRN Lavella Hammock, MD      . alum & mag hydroxide-simeth (MAALOX/MYLANTA) 200-200-20 MG/5ML suspension 30 mL  30 mL Oral Q4H PRN Lavella Hammock, MD      . Derrill Memo ON  05/30/2018] ARIPiprazole ER (ABILIFY MAINTENA) injection 400 mg  400 mg Intramuscular Q28 days Tarena Gockley B, MD      . fluvoxaMINE (LUVOX) tablet 50 mg  50 mg Oral QHS Rieley Khalsa B, MD   50 mg at 05/03/18 2141  . hydrochlorothiazide (HYDRODIURIL) tablet 25 mg  25 mg Oral Daily Jakaleb Payer B, MD   25 mg at 05/04/18 0755  . insulin detemir (LEVEMIR) injection 40 Units  40 Units Subcutaneous BID Maecyn Panning B, MD   40 Units at 05/04/18 0758  . lithium carbonate (LITHOBID) CR tablet 300 mg  300 mg Oral BID Jamilya Sarrazin B, MD   300 mg at 05/04/18 0755  . magnesium hydroxide (MILK OF MAGNESIA) suspension 30 mL  30 mL Oral Daily PRN Lavella Hammock, MD      . nicotine (NICODERM CQ - dosed in mg/24 hours) patch 14 mg  14 mg Transdermal Daily Viney Acocella B, MD        Lab Results:  Results for orders placed or performed during the hospital encounter of 05/02/18 (from the past 48 hour(s))  Glucose, capillary     Status: Abnormal   Collection Time: 05/02/18  9:35 PM  Result Value Ref Range   Glucose-Capillary 228 (H) 70 - 99 mg/dL  Glucose, capillary     Status: Abnormal   Collection Time: 05/03/18  7:11 AM  Result Value Ref Range   Glucose-Capillary 125 (H) 70 - 99 mg/dL   Comment 1 Notify RN   Glucose, capillary     Status: Abnormal   Collection Time: 05/03/18 11:12 AM  Result Value Ref Range   Glucose-Capillary 161 (H) 70 - 99 mg/dL   Comment 1 Notify RN   Glucose, capillary     Status: Abnormal   Collection Time: 05/03/18  4:28 PM  Result Value Ref Range   Glucose-Capillary 167 (H) 70 - 99 mg/dL   Comment 1 Notify RN   Glucose, capillary     Status: Abnormal   Collection Time: 05/03/18  8:57 PM  Result Value Ref Range   Glucose-Capillary 141 (H) 70 - 99 mg/dL   Comment 1 Notify RN   Lipid panel     Status: Abnormal   Collection Time: 05/04/18  6:47 AM  Result Value Ref Range   Cholesterol 169 0 - 200 mg/dL   Triglycerides 163 (H) <150  mg/dL   HDL 42 >40 mg/dL   Total CHOL/HDL Ratio 4.0 RATIO   VLDL 33 0 - 40 mg/dL  LDL Cholesterol 94 0 - 99 mg/dL    Comment:        Total Cholesterol/HDL:CHD Risk Coronary Heart Disease Risk Table                     Men   Women  1/2 Average Risk   3.4   3.3  Average Risk       5.0   4.4  2 X Average Risk   9.6   7.1  3 X Average Risk  23.4   11.0        Use the calculated Patient Ratio above and the CHD Risk Table to determine the patient's CHD Risk.        ATP III CLASSIFICATION (LDL):  <100     mg/dL   Optimal  100-129  mg/dL   Near or Above                    Optimal  130-159  mg/dL   Borderline  160-189  mg/dL   High  >190     mg/dL   Very High Performed at [facility], [address] [place], [place] [address]   TSH     Status: None   Collection Time: 05/04/18  6:47 AM  Result Value Ref Range   TSH 1.570 0.350 - 4.500 uIU/mL    Comment: Performed by a 3rd Generation assay with a functional sensitivity of <=0.01 uIU/mL. Performed at [facility], [address] [place], [place] [address]   Hemoglobin A1c     Status: Abnormal   Collection Time: 05/04/18  6:47 AM  Result Value Ref Range   Hgb A1c MFr Bld 6.9 (H) 4.8 - 5.6 %    Comment: (NOTE) Pre diabetes:          5.7%-6.4% Diabetes:              >6.4% Glycemic control for   <7.0% adults with diabetes    Mean Plasma Glucose 151.33 mg/dL    Comment: Performed at [facility] [address], [place], [place] [address]  hCG, quantitative, pregnancy     Status: None   Collection Time: 05/04/18  6:47 AM  Result Value Ref Range   hCG, Beta Chain, Quant, S <1 <5 mIU/mL    Comment:          GEST. AGE      CONC.  (mIU/mL)   <=1 WEEK        5 - 50     2 WEEKS       50 - 500     3 WEEKS       100 - 10,000     4 WEEKS     1,000 - 30,000     5 WEEKS     3,500 - 115,000   6-8 WEEKS     12,000 - 270,000    12 WEEKS     15,000 - 220,000        FEMALE AND NON-PREGNANT FEMALE:     LESS THAN  5 mIU/mL Performed at [facility], [address] [place], [place] [address]   Glucose, capillary     Status: Abnormal   Collection Time: 05/04/18  7:09 AM  Result Value Ref Range   Glucose-Capillary 169 (H) 70 - 99 mg/dL   Comment 1 Notify RN     Blood Alcohol level:  Lab Results  Component Value Date  ETH <10 05/01/2018   ETH <10 49/67/5916    Metabolic Disorder Labs: Lab Results  Component Value Date   HGBA1C 6.9 (H) 05/04/2018   MPG 151.33 05/04/2018   MPG 214.47 12/01/2017   Lab Results  Component Value Date   PROLACTIN 36.7 (H) 12/01/2017   PROLACTIN 37.3 (H) 07/18/2015   Lab Results  Component Value Date   CHOL 169 05/04/2018   TRIG 163 (H) 05/04/2018   HDL 42 05/04/2018   CHOLHDL 4.0 05/04/2018   VLDL 33 05/04/2018   LDLCALC 94 05/04/2018   LDLCALC 147 (H) 12/01/2017    Physical Findings: AIMS:  , ,  ,  ,    CIWA:    COWS:     Musculoskeletal: Strength & Muscle Tone: within normal limits Gait & Station: normal Patient leans: N/A  Psychiatric Specialty Exam: Physical Exam  Nursing note and vitals reviewed. Psychiatric: She has a normal mood and affect. Her speech is normal and behavior is normal. Thought content normal. Cognition and memory are normal. She expresses impulsivity.    Review of Systems  Neurological: Negative.   Psychiatric/Behavioral: Positive for depression and suicidal ideas.  All other systems reviewed and are negative.   Blood pressure (!) 112/51, pulse 66, temperature 97.8 F (36.6 C), resp. rate 18, height 5\' 7"  (1.702 m), weight 105.7 kg, SpO2 100 %.Body mass index is 36.49 kg/m.  General Appearance: Casual  Eye Contact:  Good  Speech:  Clear and Coherent  Volume:  Normal  Mood:  Anxious  Affect:  Appropriate  Thought Process:  Goal Directed and Descriptions of Associations: Intact  Orientation:  Full (Time, Place, and Person)  Thought Content:  WDL  Suicidal Thoughts:  Yes.  without intent/plan   Homicidal Thoughts:  No  Memory:  Immediate;   Fair Recent;   Fair Remote;   Fair  Judgement:  Impaired  Insight:  Lacking  Psychomotor Activity:  Normal  Concentration:  Concentration: Fair and Attention Span: Fair  Recall:  [employer] of Knowledge:  Fair  Language:  Fair  Akathisia:  No  Handed:  Right  AIMS (if indicated):     Assets:  Communication Skills Desire for Improvement Financial Resources/Insurance Housing Physical Health Resilience Social Support  ADL's:  Intact  Cognition:  WNL  Sleep:  Number of Hours: 6.5     Treatment Plan Summary: Daily contact with patient to assess and evaluate symptoms and progress in treatment and Medication management   [name] is a 28 year old female with a history of bipolar disorder and intellectual disability who return to ER threatening suicide if not placed in a new group home. I do not believe it possible.  #Suicidal ideation -patient able to contract for safey in the hospital  #Bipolar disorder -received Abilify maintena on 1/6. Next dose on 05/30/2018 -continue Lithium 300 mg BID, patient with diabetes -KFT normal  #OCD -increase Luvox to 100 mg nighty  #DM -Levemir 40 units BID -ADA diet, CBG  #HTN -HCTZ 25 mg daily  #Labs -lipid panel elevatd TG, TSH nomal, A1C 6.8 -EKG -pregnancy test  #Smoking cessation -nicotine patch is available  #Disposition -discharge to her group homr -follow up with [facility] team  Orson Slick, MD 05/04/2018, 11:37 AM

## 2018-05-03 NOTE — Progress Notes (Signed)
Recreation Therapy Notes  Date: 05/03/2018  Time: 9:30 am   Location: Craft room   Behavioral response: N/A   Intervention Topic: Stress Management  Discussion/Intervention: Patient did not attend group.   Clinical Observations/Feedback:  Patient did not attend group.   Charlea Nardo LRT/CTRS        Keirstin Musil 05/03/2018 12:34 PM

## 2018-05-03 NOTE — [facility]HH Suicide Risk Assessment (Signed)
[facility] Admission Suicide Risk Assessment   Nursing information obtained from:  Patient, Review of record Demographic factors:  Low socioeconomic status, Unemployed Current Mental Status:  Self-harm thoughts, Suicide plan, Suicidal ideation indicated by patient, Plan includes specific time, place, or method, Intention to act on suicide plan, Belief that plan would result in death Loss Factors:  Financial problems / change in socioeconomic status Historical Factors:  Prior suicide attempts Risk Reduction Factors:  NA  Total Time spent with patient: 1 hour Principal Problem: Bipolar I disorder, most recent episode depressed, severe without psychotic features ([place]) Diagnosis:  Principal Problem:   Bipolar I disorder, most recent episode depressed, severe without psychotic features ([place]) Active Problems:   Borderline intellectual functioning   Diabetes mellitus ([place])   HTN (hypertension)   Tobacco use disorder  Subjective Data: suicidal ideation  Continued Clinical Symptoms:  Alcohol Use Disorder Identification Test Final Score (AUDIT): 0 The "Alcohol Use Disorders Identification Test", Guidelines for Use in Primary Care, Second Edition.  World Pharmacologist [facility]). Score between 0-7:  no or low risk or alcohol related problems. Score between 8-15:  moderate risk of alcohol related problems. Score between 16-19:  high risk of alcohol related problems. Score 20 or above:  warrants further diagnostic evaluation for alcohol dependence and treatment.   CLINICAL FACTORS:   Schizophrenia:   Depressive state Less than 35 years old Paranoid or undifferentiated type Currently Psychotic   Musculoskeletal: Strength & Muscle Tone: within normal limits Gait & Station: normal Patient leans: N/A  Psychiatric Specialty Exam: Physical Exam  Nursing note and vitals reviewed. Psychiatric: Her affect is angry, labile and inappropriate. Her speech is rapid and/or pressured. She is hyperactive.  Thought content is paranoid and delusional. Cognition and memory are impaired. She expresses impulsivity.    Review of Systems  Neurological: Negative.   Psychiatric/Behavioral: Positive for depression and suicidal ideas.  All other systems reviewed and are negative.   Blood pressure (!) 101/59, pulse 97, temperature 98.6 F (37 C), temperature source Oral, resp. rate 18, height 5\' 7"  (1.702 m), weight 105.7 kg, SpO2 100 %.Body mass index is 36.49 kg/m.  General Appearance: Casual  Eye Contact:  Good  Speech:  Garbled  Volume:  Increased  Mood:  Angry, Dysphoric and Irritable  Affect:  Inappropriate and Labile  Thought Process:  Irrelevant  Orientation:  Other:  person and plance  Thought Content:  Delusions, Obsessions, Paranoid Ideation and Rumination  Suicidal Thoughts:  Yes.  with intent/plan  Homicidal Thoughts:  No  Memory:  Immediate;   Poor Recent;   Poor Remote;   Poor  Judgement:  Poor  Insight:  Lacking  Psychomotor Activity:  Increased  Concentration:  Concentration: Poor and Attention Span: Poor  Recall:  Poor  Fund of Knowledge:  Poor  Language:  Poor  Akathisia:  No  Handed:  Right  AIMS (if indicated):     Assets:  Communication Skills Desire for Improvement Financial Resources/Insurance Housing Physical Health Resilience Social Support  ADL's:  Intact  Cognition:  WNL  Sleep:  Number of Hours: 7      COGNITIVE FEATURES THAT CONTRIBUTE TO RISK:  None    SUICIDE RISK:   Moderate:  Frequent suicidal ideation with limited intensity, and duration, some specificity in terms of plans, no associated intent, good self-control, limited dysphoria/symptomatology, some risk factors present, and identifiable protective factors, including available and accessible social support.  PLAN OF CARE: hospital admission, medication management, discharge planning (housing).  [name]  is a 28 year old female with a history of bipolar disorder and intellectual  disability who return to ER threatening suicide if not placed in a new group home. I do not believe it possible.  #Suicidalideation -patient able to contract for safey in the hospital  #Bipolar disorder -received Abilify maintena on 1/6. Next dose on 05/30/2018 -continue Lithium 300 mg BID, patient with diabetes -so far KFT normal  #DM -Levemir 40 units BID -ADA diet, CBG  #HTN -HCTZ  #Labs -lipid panel, TSH, A1C -EKG -pregnancy test  #Smoking cessation -nicotine patch is available  #Disposition -discharge to her group homr -follow up with [facility] team  I certify that inpatient services furnished can reasonably be expected to improve the patient's condition.   Orson Slick, MD 05/03/2018, 10:38 AM
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
## 2018-05-03 NOTE — Plan of Care (Signed)
Patient remains safe on the unit, observed sporadically in the milieu. Compliant with treatment plan, affect is improving some. Denies having thought of self harm, HI or AVH and pain at this time. Patient however endorses some depression an anxiety. Sleep pattern is improving with a 7 hour sleep cycle reported from night shift. Reports last BM was this morning. Milieu remains safe with a q 15 minute safety checks.
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
## 2018-05-03 NOTE — BHH Group Notes (Signed)
Feelings Around Diagnosis 05/03/2018 1PM  Type of Therapy/Topic:  Group Therapy:  Feelings about Diagnosis  Participation Level:  Did Not Attend   Description of Group:   This group will allow patients to explore their thoughts and feelings about diagnoses they have received. Patients will be guided to explore their level of understanding and acceptance of these diagnoses. Facilitator will encourage patients to process their thoughts and feelings about the reactions of others to their diagnosis and will guide patients in identifying ways to discuss their diagnosis with significant others in their lives. This group will be process-oriented, with patients participating in exploration of their own experiences, giving and receiving support, and processing challenge from other group members.   Therapeutic Goals: 1. Patient will demonstrate understanding of diagnosis as evidenced by identifying two or more symptoms of the disorder 2. Patient will be able to express two feelings regarding the diagnosis 3. Patient will demonstrate their ability to communicate their needs through discussion and/or role play  Summary of Patient Progress:       Therapeutic Modalities:   Cognitive Behavioral Therapy Brief Therapy Feelings Identification    Yvette Rack, LCSW 05/03/2018 3:08 PM

## 2018-05-04 LAB — LIPID PANEL
CHOL/HDL RATIO: 4 ratio
Cholesterol: 169 mg/dL (ref 0–200)
HDL: 42 mg/dL (ref >40)
LDL Cholesterol: 94 mg/dL (ref 0–99)
Triglycerides: 163 mg/dL — ABNORMAL HIGH (ref <150)
VLDL: 33 mg/dL (ref 0–40)

## 2018-05-04 LAB — HEMOGLOBIN A1C
Hgb A1c MFr Bld: 6.9 % — ABNORMAL HIGH (ref 4.8–5.6)
Mean Plasma Glucose: 151.33 mg/dL

## 2018-05-04 LAB — GLUCOSE, CAPILLARY
GLUCOSE-CAPILLARY: 100 mg/dL — AB (ref 70–99)
Glucose-Capillary: 169 mg/dL — ABNORMAL HIGH (ref 70–99)
Glucose-Capillary: 120 mg/dL — ABNORMAL HIGH (ref 70–99)
Glucose-Capillary: 102 mg/dL — ABNORMAL HIGH (ref 70–99)

## 2018-05-04 LAB — TSH: TSH: 1.57 u[IU]/mL (ref 0.350–4.500)

## 2018-05-04 LAB — HCG, QUANTITATIVE, PREGNANCY: hCG, Beta Chain, Quant, S: <1 m[IU]/mL (ref <5)

## 2018-05-04 MED ORDER — FLUVOXAMINE MALEATE 50 MG PO TABS
100.0000 mg | ORAL_TABLET | Freq: Every day | ORAL | Status: DC
  Administered 2018-05-04 – 2018-05-05 (×2): 100 mg via ORAL
  Filled 2018-05-04 (×2): qty 2

## 2018-05-04 NOTE — Progress Notes (Signed)
Recreation Therapy Notes  INPATIENT RECREATION THERAPY ASSESSMENT  Patient Details Name: ARLENIS BLAYDES MRN: [id number] DOB: [birth year] Today's Date: 05/04/2018       Information Obtained From: Patient  Able to Participate in Assessment/Interview: Yes  Patient Presentation: Responsive  Reason for Admission (Per Patient): Active Symptoms, Other (Comments)(depression)  Patient Stressors: Other (Comment)(Living situation)  Coping Skills:   Other (Comment)(walk)  Leisure Interests (2+):  Exercise - Walking(Catching the bus)  Frequency of Recreation/Participation: Monthly  Awareness of Community Resources:  Yes  Community Resources:  Other (Comment)(Bus)  Current Use:    If no, Barriers?:    Expressed Interest in [employer] Information:    [place] of Residence:  Insurance underwriter  Patient Main Form of Transportation: Other (Comment)(Group Home)  Patient Strengths:  N/A  Patient Identified Areas of Improvement:  Move to another group home  Patient Goal for Hospitalization:  Get a new group home  Current SI (including self-harm):  Yes(Over dose on depression medication while here in the hospital)  Current HI:  No  Current AVH: No  Staff Intervention Plan: Group Attendance, Collaborate with Interdisciplinary Treatment Team  Consent to Intern Participation: N/A  Adylynn Hertenstein 05/04/2018, 1:44 PM

## 2018-05-04 NOTE — BHH Group Notes (Signed)
[facility] Group Notes:  (Nursing/MHT/Case Management/Adjunct)  Date:  05/04/2018  Time:  2:21 PM  Type of Therapy:  Psychoeducational Skills  Participation Level:  Did Not Attend   [place] 05/04/2018, 2:21 PM

## 2018-05-04 NOTE — BHH Counselor (Signed)
CSW spoke with Penni Homans at [facility], [phone], at 9:05AM regarding patient.  CSW informed that patient is on the unit and no discharge date has been given at this time.  Ms. Hardin Negus, requested to be updated once a discharge date has been given.  CSW informed that the patient is requesting a group home at this time.  Assunta Curtis, MSW, LCSW 05/04/2018 9:53 AM

## 2018-05-04 NOTE — Progress Notes (Signed)
Recreation Therapy Notes  Date: 05/04/2018  Time: 9:30 am   Location: Craft room   Behavioral response: N/A   Intervention Topic: Problem Solving  Discussion/Intervention: Patient did not attend group.   Clinical Observations/Feedback:  Patient did not attend group.   Yanice Maqueda LRT/CTRS        Genola Yuille 05/04/2018 10:26 AM

## 2018-05-04 NOTE — BHH Group Notes (Addendum)
LCSW Group Therapy Note  05/04/2018 1:00PM  Type of Therapy/Topic:  Group Therapy:  Emotion Regulation  Participation Level:  Did Not Attend   Description of Group:   The purpose of this group is to assist patients in learning to regulate negative emotions and experience positive emotions. Patients will be guided to discuss ways in which they have been vulnerable to their negative emotions. These vulnerabilities will be juxtaposed with experiences of positive emotions or situations, and patients will be challenged to use positive emotions to combat negative ones. Special emphasis will be placed on coping with negative emotions in conflict situations, and patients will process healthy conflict resolution skills.  Therapeutic Goals: 1. Patient will identify two positive emotions or experiences to reflect on in order to balance out negative emotions 2. Patient will label two or more emotions that they find the most difficult to experience 3. Patient will demonstrate positive conflict resolution skills through discussion and/or role plays  Summary of Patient Progress: Patient did not attend.   Therapeutic Modalities:   Cognitive Behavioral Therapy Feelings Identification Dialectical Behavioral Therapy  Assunta Curtis, MSW, LCSW 05/04/2018 2:12 PM   CSW corrected time of note. Assunta Curtis, MSW, LCSW 05/04/2018 2:16 PM

## 2018-05-04 NOTE — Progress Notes (Signed)
Patient stayed in room. Did not participate in group activities. Medications taken to her in room. Pleasant and cooperative but appears to be preoccupied. CBG 141 at HS. Patient slept throughout the night and had no sign of distress. Staff continue to monitor for safety.

## 2018-05-04 NOTE — Plan of Care (Signed)
Patient is alert and oriented X 3, very preoccupied with group home, very tearful this morning on thoughts of returning to group home. Patient voiced not wanting to return to group home. Patient states, "They yell at me and my roommate is very mean and plays loud music. I don't want y'all to call them because they will yell at me if I have to go back there. I have talked with my ACT team, they said I would not have a different place by the time I am discharged from here. I guess I will keep coming back here then because I don't want to be there." Patient states "Yes I have SI thoughts because I have to go back there to that group home." Patient provided education on APS reporting if feeling in danger. RN reported in morning report patient does not want to go back to group home. Patient denies AVH and rates pain 0/10. Safety checks to continue Q 15 minutes. Problem: Education: Goal: Knowledge of [facility] Education information/materials will improve Outcome: Not Progressing Goal: Emotional status will improve Outcome: Not Progressing Goal: Mental status will improve Outcome: Not Progressing Goal: Verbalization of understanding the information provided will improve Outcome: Not Progressing   Problem: Activity: Goal: Interest or engagement in activities will improve Outcome: Not Progressing Goal: Sleeping patterns will improve Outcome: Not Progressing   Problem: Coping: Goal: Ability to verbalize frustrations and anger appropriately will improve Outcome: Not Progressing Goal: Ability to demonstrate self-control will improve Outcome: Not Progressing

## 2018-05-04 NOTE — Tx Team (Addendum)
Interdisciplinary Treatment and Diagnostic Plan Update  05/04/2018 Time of Session: 10:30 AM [name] MRN: [id number]  Principal Diagnosis: Bipolar I disorder, most recent episode depressed, severe without psychotic features ([place])  Secondary Diagnoses: Principal Problem:   Bipolar I disorder, most recent episode depressed, severe without psychotic features ([place]) Active Problems:   Borderline intellectual functioning   Diabetes mellitus ([place])   OCD (obsessive compulsive disorder)   HTN (hypertension)   Tobacco use disorder   Current Medications:  Current Facility-Administered Medications  Medication Dose Route Frequency Provider Last Rate Last Dose  . acetaminophen (TYLENOL) tablet 650 mg  650 mg Oral Q6H PRN Lavella Hammock, MD      . alum & mag hydroxide-simeth (MAALOX/MYLANTA) 200-200-20 MG/5ML suspension 30 mL  30 mL Oral Q4H PRN Lavella Hammock, MD      . Derrill Memo ON 05/30/2018] ARIPiprazole ER (ABILIFY MAINTENA) injection 400 mg  400 mg Intramuscular Q28 days Pucilowska, Jolanta B, MD      . fluvoxaMINE (LUVOX) tablet 50 mg  50 mg Oral QHS Pucilowska, Jolanta B, MD   50 mg at 05/03/18 2141  . hydrochlorothiazide (HYDRODIURIL) tablet 25 mg  25 mg Oral Daily Pucilowska, Jolanta B, MD   25 mg at 05/04/18 0755  . insulin detemir (LEVEMIR) injection 40 Units  40 Units Subcutaneous BID Pucilowska, Jolanta B, MD   40 Units at 05/04/18 0758  . lithium carbonate (LITHOBID) CR tablet 300 mg  300 mg Oral BID Pucilowska, Jolanta B, MD   300 mg at 05/04/18 0755  . magnesium hydroxide (MILK OF MAGNESIA) suspension 30 mL  30 mL Oral Daily PRN Lavella Hammock, MD      . nicotine (NICODERM CQ - dosed in mg/24 hours) patch 14 mg  14 mg Transdermal Daily Pucilowska, Jolanta B, MD       PTA Medications: Medications Prior to Admission  Medication Sig Dispense Refill Last Dose  . ABILIFY MAINTENA 400 MG PRSY prefilled syringe Inject 400 mg as directed every 30 (thirty) days.  6 Unknown at Unknown   . busPIRone (BUSPAR) 15 MG tablet Take 1 tablet (15 mg total) by mouth 3 (three) times daily. 30 tablet 0 Unknown at Unknown  . hydrochlorothiazide (HYDRODIURIL) 25 MG tablet Take 25 mg by mouth daily.   Unknown at Unknown  . insulin detemir (LEVEMIR) 100 UNIT/ML injection Inject 40 Units into the skin 2 (two) times daily.   Unknown at Unknown  . lithium carbonate (LITHOBID) 300 MG CR tablet Take 300 mg by mouth 2 (two) times daily.   6 Unknown at Unknown  . nicotine polacrilex (NICORETTE) 2 MG gum Take 1 each (2 mg total) by mouth every hour as needed for smoking cessation. 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   Physician Treatment Plan for Primary Diagnosis: Bipolar I disorder, most recent episode depressed, severe without psychotic features ([place]) Long Term Goal(s): Improvement in symptoms so as ready for discharge Improvement in symptoms so as ready for discharge   Short Term Goals: Ability to identify changes in lifestyle to reduce recurrence of condition will improve Ability to verbalize feelings will improve Ability to disclose and discuss suicidal ideas Ability to demonstrate self-control  will improve Ability to identify and develop effective coping behaviors will improve Ability to maintain clinical measurements within normal limits will improve Compliance with prescribed medications will improve Ability to identify triggers associated with substance abuse/mental health issues will improve NA  Medication Management: Evaluate patient's response, side effects, and tolerance  of medication regimen.  Therapeutic Interventions: 1 to 1 sessions, Unit Group sessions and Medication administration.  Evaluation of Outcomes: Progressing  Physician Treatment Plan for Secondary Diagnosis: Principal Problem:   Bipolar I disorder, most recent episode depressed, severe without psychotic features ([place]) Active Problems:   Borderline intellectual functioning   Diabetes mellitus ([place])   OCD (obsessive compulsive disorder)   HTN (hypertension)   Tobacco use disorder  Long Term Goal(s): Improvement in symptoms so as ready for discharge Improvement in symptoms so as ready for discharge   Short Term Goals: Ability to identify changes in lifestyle to reduce recurrence of condition will improve Ability to verbalize feelings will improve Ability to disclose and discuss suicidal ideas Ability to demonstrate self-control will improve Ability to identify and develop effective coping behaviors will improve Ability to maintain clinical measurements within normal limits will improve Compliance with prescribed medications will improve Ability to identify triggers associated with substance abuse/mental health issues will improve NA     Medication Management: Evaluate patient's response, side effects, and tolerance of medication regimen.  Therapeutic Interventions: 1 to 1 sessions, Unit Group sessions and Medication administration.  Evaluation of Outcomes: Progressing   RN Treatment Plan for Primary Diagnosis: Bipolar I disorder, most recent episode depressed, severe without psychotic features ([place]) Long Term Goal(s): Knowledge of disease and therapeutic regimen to maintain health will improve  Short Term Goals: Ability to verbalize frustration and anger appropriately will improve, Ability to verbalize feelings will improve, Ability to identify and develop effective coping behaviors will improve and Compliance with prescribed medications will improve  Medication Management: RN  will administer medications as ordered by provider, will assess and evaluate patient's response and provide education to patient for prescribed medication. RN will report any adverse and/or side effects to prescribing provider.  Therapeutic Interventions: 1 on 1 counseling sessions, Psychoeducation, Medication administration, Evaluate responses to treatment, Monitor vital signs and CBGs as ordered, Perform/monitor CIWA, COWS, AIMS and Fall Risk screenings as ordered, Perform wound care treatments as ordered.  Evaluation of Outcomes: Progressing   LCSW Treatment Plan for Primary Diagnosis: Bipolar I disorder, most recent episode depressed, severe without psychotic features ([place]) Long Term Goal(s): Safe transition to appropriate next level of care at discharge, Engage patient in therapeutic group addressing interpersonal concerns.  Short Term Goals: Engage patient in aftercare planning with referrals and resources, Increase social support, Increase emotional regulation, Identify triggers associated with mental health/substance abuse issues and Increase skills for wellness and recovery  Therapeutic Interventions: Assess for all discharge needs, 1 to 1 time with Social worker, Explore available resources and support systems, Assess for adequacy in community support network, Educate family and significant other(s) on suicide prevention, Complete Psychosocial Assessment, Interpersonal group therapy.  Evaluation of Outcomes: Progressing   Progress in Treatment: Attending groups: No. Participating in groups: No. Taking medication as prescribed: Yes. Toleration medication: Yes. Family/Significant other contact made: No, will contact:  CSW attempted to contact mother but there was no answer .  Second attempt is needed.   Patient understands diagnosis: No. Discussing patient identified problems/goals with staff: Yes. Medical problems stabilized or resolved: Yes. Denies suicidal/homicidal ideation:  Yes. Issues/concerns per patient self-inventory: No. Other: none  New problem(s) identified: No, Describe:  none  New Short Term/Long Term Goal(s):  Patient Goals:  "go to a new place"  Discharge Plan or Barriers:   Reason for Continuation of Hospitalization: Anxiety Depression  Estimated Length of Stay:    Recreational Therapy: Patient Stressors: Living  Patient Goal: Patient will engage in groups without prompting or encouragement from LRT x3 group sessions within 5 recreation therapy group sessions  Attendees: Patient: [name] 05/04/2018 11:10 AM  Physician: Dr. Bary Leriche 05/04/2018 11:10 AM  Nursing: Polly Cobia, RN 05/04/2018 11:10 AM  RN Care Manager: 05/04/2018 11:10 AM  Social Worker: Assunta Curtis, MSW, LCSW 05/04/2018 11:10 AM  Recreational Therapist: Roanna Epley CTRS, LRT 05/04/2018 11:10 AM  Other:  05/04/2018 11:10 AM  Other:  05/04/2018 11:10 AM  Other: 05/04/2018 11:10 AM    Scribe for Treatment Team: Rozann Lesches, LCSW 05/04/2018 11:10 AM

## 2018-05-05 ENCOUNTER — Ambulatory Visit: Payer: Self-pay | Admitting: Obstetrics and Gynecology

## 2018-05-05 LAB — GLUCOSE, CAPILLARY
GLUCOSE-CAPILLARY: 139 mg/dL — AB (ref 70–99)
Glucose-Capillary: 104 mg/dL — ABNORMAL HIGH (ref 70–99)

## 2018-05-05 MED ORDER — ABILIFY MAINTENA 400 MG IM PRSY: 400.0000 mg | PREFILLED_SYRINGE | INTRAMUSCULAR | 6 refills | Status: DC

## 2018-05-05 MED ORDER — FLUVOXAMINE MALEATE 100 MG PO TABS: 100.0000 mg | ORAL_TABLET | Freq: Every day | ORAL | 1 refills | Status: DC

## 2018-05-05 NOTE — Discharge Planning (Deleted)
Patient is alert and oriented X 4 denies SI, HI and AVH. Patient denies pain 0/10. Patient escorted to waiting area where members of her church picked her up to take home.

## 2018-05-05 NOTE — Plan of Care (Signed)
  Problem: Education: Goal: Knowledge of [facility] Education information/materials will improve Outcome: Progressing   Problem: Education: Goal: Knowledge of [facility] Education information/materials will improve Outcome: Progressing Goal: Emotional status will improve Outcome: Progressing Goal: Mental status will improve Outcome: Progressing Goal: Verbalization of understanding the information provided will improve Outcome: Progressing   Problem: Education: Goal: Emotional status will improve Outcome: Progressing   Problem: Education: Goal: Mental status will improve Outcome: Progressing   Problem: Education: Goal: Verbalization of understanding the information provided will improve Outcome: Progressing   Problem: Activity: Goal: Interest or engagement in activities will improve Outcome: Progressing Goal: Sleeping patterns will improve Outcome: Progressing   Problem: Activity: Goal: Interest or engagement in activities will improve Outcome: Progressing   Problem: Activity: Goal: Sleeping patterns will improve Outcome: Progressing   Problem: Coping: Goal: Ability to verbalize frustrations and anger appropriately will improve Outcome: Progressing Goal: Ability to demonstrate self-control will improve Outcome: Progressing   Problem: Coping: Goal: Ability to verbalize frustrations and anger appropriately will improve Outcome: Progressing   Problem: Health Behavior/Discharge Planning: Goal: Identification of resources available to assist in meeting health care needs will improve Outcome: Progressing Goal: Compliance with treatment plan for underlying cause of condition will improve Outcome: Progressing   Problem: Health Behavior/Discharge Planning: Goal: Identification of resources available to assist in meeting health care needs will improve Outcome: Progressing   Problem: Health Behavior/Discharge Planning: Goal: Compliance with treatment  plan for underlying cause of condition will improve Outcome: Progressing   Problem: Physical Regulation: Goal: Ability to maintain clinical measurements within normal limits will improve Outcome: Progressing   Problem: Physical Regulation: Goal: Ability to maintain clinical measurements within normal limits will improve Outcome: Progressing   Problem: Safety: Goal: Periods of time without injury will increase Outcome: Progressing   Problem: Safety: Goal: Periods of time without injury will increase Outcome: Progressing   Problem: Safety: Goal: Ability to redirect hostility and anger into socially appropriate behaviors will improve Outcome: Progressing Goal: Ability to remain free from injury will improve Outcome: Progressing   Problem: Safety: Goal: Ability to redirect hostility and anger into socially appropriate behaviors will improve Outcome: Progressing   Problem: Safety: Goal: Ability to remain free from injury will improve Outcome: Progressing   Problem: Education: Goal: Will be free of psychotic symptoms Outcome: Progressing   Problem: Education: Goal: Will be free of psychotic symptoms Outcome: Progressing

## 2018-05-05 NOTE — Progress Notes (Signed)
Patient is alert and oriented x 4, denies SI, HI and AVH today. Patient isolates to her room, but no crying outburst today. Patient is eating meals, spends most of the time in room in the bed. No self injurious behaviors.

## 2018-05-05 NOTE — Progress Notes (Signed)
CSW spoke to [employer] [facility].  They are actively looking for a new group home placement for pt but they do not have one yet.  Pt can return to current group home, even though she does not want to.  [place], [phone]. Winferd Humphrey, MSW, LCSW Clinical Social Worker 05/05/2018 2:37 PM

## 2018-05-05 NOTE — BHH Suicide Risk Assessment (Addendum)
[place] INPATIENT:  Family/Significant Other Suicide Prevention Education  Suicide Prevention Education:  Contact Attempts: Lovina Zuver, mother, [phone], has been identified by the patient as the family member/significant other with whom the patient will be residing, and identified as the person(s) who will aid the patient in the event of a mental health crisis.  With written consent from the patient, two attempts were made to provide suicide prevention education, prior to and/or following the patient's discharge.  We were unsuccessful in providing suicide prevention education.  A suicide education pamphlet was given to the patient to share with family/significant other.  Date and time of first attempt:05/05/18, 1248 Date and time of second attempt:05/06/18 0916  Joanne Chars, [place] 05/05/2018, 12:48 PM

## 2018-05-05 NOTE — BHH Suicide Risk Assessment (Signed)
[facility] Discharge Suicide Risk Assessment   Principal Problem: Bipolar I disorder, most recent episode depressed, severe without psychotic features ([place]) Discharge Diagnoses: Principal Problem:   Bipolar I disorder, most recent episode depressed, severe without psychotic features ([place]) Active Problems:   Borderline intellectual functioning   Diabetes mellitus ([place])   OCD (obsessive compulsive disorder)   HTN (hypertension)   Tobacco use disorder   Total Time spent with patient: 20 minutes  Musculoskeletal: Strength & Muscle Tone: within normal limits Gait & Station: normal Patient leans: N/A  Psychiatric Specialty Exam: Review of Systems  Neurological: Negative.   Psychiatric/Behavioral: Negative.   All other systems reviewed and are negative.   Blood pressure 112/75, pulse 71, temperature 98.2 F (36.8 C), temperature source Oral, resp. rate 18, height 5\' 7"  (1.702 m), weight 105.7 kg, SpO2 100 %.Body mass index is 36.49 kg/m.  General Appearance: Casual  Eye Contact::  Good  Speech:  Clear and Coherent409  Volume:  Normal  Mood:  Euthymic  Affect:  Flat  Thought Process:  Goal Directed and Descriptions of Associations: Intact  Orientation:  Full (Time, Place, and Person)  Thought Content:  Delusions and Paranoid Ideation  Suicidal Thoughts:  No  Homicidal Thoughts:  No  Memory:  Immediate;   Poor Recent;   Poor Remote;   Poor  Judgement:  Poor  Insight:  Lacking  Psychomotor Activity:  Decreased  Concentration:  Fair  Recall:  [place]  Language: Fair  Akathisia:  No  Handed:  Right  AIMS (if indicated):     Assets:  Communication Skills Desire for Improvement Financial Resources/Insurance Housing Physical Health Resilience Social Support  Sleep:  Number of Hours: 7.5  Cognition: WNL  ADL's:  Intact   Mental Status Per Nursing Assessment::   On Admission:  Self-harm thoughts, Suicide plan, Suicidal ideation indicated by patient, Plan  includes specific time, place, or method, Intention to act on suicide plan, Belief that plan would result in death  Demographic Factors:  Adolescent or young adult  Loss Factors: NA  Historical Factors: Prior suicide attempts and Impulsivity  Risk Reduction Factors:   Sense of responsibility to family, Living with another person, especially a relative and Positive social support  Continued Clinical Symptoms:  Schizophrenia:   Depressive state Less than 37 years old Paranoid or undifferentiated type  Cognitive Features That Contribute To Risk:  None    Suicide Risk:  Minimal: No identifiable suicidal ideation.  Patients presenting with no risk factors but with morbid ruminations; may be classified as minimal risk based on the severity of the depressive symptoms    Plan Of Care/Follow-up recommendations:  Activity:  as tolerated Diet:  low sodium heart healthy ADA diet Other:  keep follow up appointments  Orson Slick, MD 05/06/2018, 9:40 AM

## 2018-05-05 NOTE — Progress Notes (Signed)
D - Patient was in her room upon arrival to the unit. Patient was pleasant during assessment and medication administration. Patient denies HI/AVH, pain, anxiety and depression. Patient has passive SI but contracts for safety with this Probation officer. Patient stated, "I had an ok day but I really need to find somewhere new to live. I don't want to go back to the group home I came from." Patient given education. Patient was isolative to her room but did come out for snack.   A - Patient compliant with medication administration per MD orders and procedures on the unit. Patient given education. Patient given support and encouragement to attend group and be active in her treatment plan. Patient informed to let staff know if there are any issues or problems on the unit.   R - Patient being monitored Q 15 minutes for safety per unit protocol. Patient remains safe on the unit.

## 2018-05-05 NOTE — Progress Notes (Signed)
[facility] MD Progress Note  05/05/2018 3:24 PM [name]  MRN:  [id number]  Subjective:    Ms. Gilkerson is feeling better. Mood has improved, affects brighter. She is not suicidal, homicidal or hallucinating. She was explained again that we will not be able to find her e a new group home from here. She seemed to understand and did not protest. Anticipated discharge tomorrow.  Principal Problem: Bipolar I disorder, most recent episode depressed, severe without psychotic features ([place]) Diagnosis: Principal Problem:   Bipolar I disorder, most recent episode depressed, severe without psychotic features ([place]) Active Problems:   Borderline intellectual functioning   Diabetes mellitus ([place])   OCD (obsessive compulsive disorder)   HTN (hypertension)   Tobacco use disorder  Total Time spent with patient: 20 minutes  Past Psychiatric History: schizophrenia  Past Medical History:  Past Medical History:  Diagnosis Date  . Anxiety   . Bipolar 1 disorder ([place])   . Cognitive deficits   . Depression   . Diabetes mellitus without complication ([place])   . Hypertension   . Mental disorder   . Obesity     Past Surgical History:  Procedure Laterality Date  . CESAREAN SECTION    . CESAREAN SECTION N/A 04/25/2013   Procedure: REPEAT CESAREAN SECTION;  Surgeon: [name], MD;  Location: [facility];  Service: Obstetrics;  Laterality: N/A;  . MASS EXCISION N/A 06/03/2012   Procedure: EXCISION MASS;  Surgeon: [name], MD;  Location: [facility];  Service: ENT;  Laterality: N/A;  Excision uvula mass  . TONSILLECTOMY N/A 06/03/2012   Procedure: TONSILLECTOMY;  Surgeon: [name], MD;  Location: [facility];  Service: ENT;  Laterality: N/A;  . TONSILLECTOMY     Family History:  Family History  Problem Relation Age of Onset  . Hypertension Mother   . Diabetes Father    Family Psychiatric  History: none Social History:  Social History   Substance and Sexual  Activity  Alcohol Use No     Social History   Substance and Sexual Activity  Drug Use No   Comment: Patient denies    Social History   Socioeconomic History  . Marital status: Single    Spouse name: Not on file  . Number of children: Not on file  . Years of education: Not on file  . Highest education level: Not on file  Occupational History  . Not on file  Social Needs  . Financial resource strain: Not on file  . Food insecurity:    Worry: Not on file    Inability: Not on file  . Transportation needs:    Medical: Not on file    Non-medical: Not on file  Tobacco Use  . Smoking status: Current Every Day Smoker    Packs/day: 3.00    Years: 11.00    Pack years: 33.00    Types: Cigarettes  . Smokeless tobacco: Never Used  Substance and Sexual Activity  . Alcohol use: No  . Drug use: No    Comment: Patient denies  . Sexual activity: Yes    Birth control/protection: Implant  Lifestyle  . Physical activity:    Days per week: Not on file    Minutes per session: Not on file  . Stress: Not on file  Relationships  . Social connections:    Talks on phone: Not on file    Gets together: Not on file    Attends religious service: Not on file  Active member of club or organization: Not on file    Attends meetings of clubs or organizations: Not on file    Relationship status: Not on file  Other Topics Concern  . Not on file  Social History Narrative  . Not on file   Additional Social History:                         Sleep: Fair  Appetite:  Fair  Current Medications: Current Facility-Administered Medications  Medication Dose Route Frequency Provider Last Rate Last Dose  . acetaminophen (TYLENOL) tablet 650 mg  650 mg Oral Q6H PRN Lavella Hammock, MD      . alum & mag hydroxide-simeth (MAALOX/MYLANTA) 200-200-20 MG/5ML suspension 30 mL  30 mL Oral Q4H PRN Lavella Hammock, MD      . Derrill Memo ON 05/30/2018] ARIPiprazole ER (ABILIFY MAINTENA) injection 400 mg   400 mg Intramuscular Q28 days Roderic Lammert B, MD      . fluvoxaMINE (LUVOX) tablet 100 mg  100 mg Oral QHS Tateanna Bach B, MD   100 mg at 05/04/18 2135  . insulin detemir (LEVEMIR) injection 40 Units  40 Units Subcutaneous BID Zola Runion B, MD   40 Units at 05/05/18 0757  . lithium carbonate (LITHOBID) CR tablet 300 mg  300 mg Oral BID Wanette Robison B, MD   300 mg at 05/05/18 0757  . magnesium hydroxide (MILK OF MAGNESIA) suspension 30 mL  30 mL Oral Daily PRN Lavella Hammock, MD      . nicotine (NICODERM CQ - dosed in mg/24 hours) patch 14 mg  14 mg Transdermal Daily Loralai Eisman B, MD        Lab Results:  Results for orders placed or performed during the hospital encounter of 05/02/18 (from the past 48 hour(s))  Glucose, capillary     Status: Abnormal   Collection Time: 05/03/18  4:28 PM  Result Value Ref Range   Glucose-Capillary 167 (H) 70 - 99 mg/dL   Comment 1 Notify RN   Glucose, capillary     Status: Abnormal   Collection Time: 05/03/18  8:57 PM  Result Value Ref Range   Glucose-Capillary 141 (H) 70 - 99 mg/dL   Comment 1 Notify RN   Lipid panel     Status: Abnormal   Collection Time: 05/04/18  6:47 AM  Result Value Ref Range   Cholesterol 169 0 - 200 mg/dL   Triglycerides 163 (H) <150 mg/dL   HDL 42 >40 mg/dL   Total CHOL/HDL Ratio 4.0 RATIO   VLDL 33 0 - 40 mg/dL   LDL Cholesterol 94 0 - 99 mg/dL    Comment:        Total Cholesterol/HDL:CHD Risk Coronary Heart Disease Risk Table                     Men   Women  1/2 Average Risk   3.4   3.3  Average Risk       5.0   4.4  2 X Average Risk   9.6   7.1  3 X Average Risk  23.4   11.0        Use the calculated Patient Ratio above and the CHD Risk Table to determine the patient's CHD Risk.        ATP III CLASSIFICATION (LDL):  <100     mg/dL   Optimal  100-129  mg/dL   Near or  Above                    Optimal  130-159  mg/dL   Borderline  160-189  mg/dL   High  >190     mg/dL    Very High Performed at [facility], [address] [place], [place] [address]   TSH     Status: None   Collection Time: 05/04/18  6:47 AM  Result Value Ref Range   TSH 1.570 0.350 - 4.500 uIU/mL    Comment: Performed by a 3rd Generation assay with a functional sensitivity of <=0.01 uIU/mL. Performed at [facility], [address] [place], [place] [address]   Hemoglobin A1c     Status: Abnormal   Collection Time: 05/04/18  6:47 AM  Result Value Ref Range   Hgb A1c MFr Bld 6.9 (H) 4.8 - 5.6 %    Comment: (NOTE) Pre diabetes:          5.7%-6.4% Diabetes:              >6.4% Glycemic control for   <7.0% adults with diabetes    Mean Plasma Glucose 151.33 mg/dL    Comment: Performed at [facility] [address], [place],  [address]  hCG, quantitative, pregnancy     Status: None   Collection Time: 05/04/18  6:47 AM  Result Value Ref Range   hCG, Beta Chain, Quant, S <1 <5 mIU/mL    Comment:          GEST. AGE      CONC.  (mIU/mL)   <=1 WEEK        5 - 50     2 WEEKS       50 - 500     3 WEEKS       100 - 10,000     4 WEEKS     1,000 - 30,000     5 WEEKS     3,500 - 115,000   6-8 WEEKS     12,000 - 270,000    12 WEEKS     15,000 - 220,000        FEMALE AND NON-PREGNANT FEMALE:     LESS THAN 5 mIU/mL Performed at [facility], [address] [place],  [address]   Glucose, capillary     Status: Abnormal   Collection Time: 05/04/18  7:09 AM  Result Value Ref Range   Glucose-Capillary 169 (H) 70 - 99 mg/dL   Comment 1 Notify RN   Glucose, capillary     Status: Abnormal   Collection Time: 05/04/18 11:51 AM  Result Value Ref Range   Glucose-Capillary 120 (H) 70 - 99 mg/dL   Comment 1 Notify RN   Glucose, capillary     Status: Abnormal   Collection Time: 05/04/18  4:26 PM  Result Value Ref Range   Glucose-Capillary 102 (H) 70 - 99 mg/dL   Comment 1 Notify RN   Glucose, capillary     Status: Abnormal   Collection Time:  05/04/18  8:15 PM  Result Value Ref Range   Glucose-Capillary 100 (H) 70 - 99 mg/dL   Comment 1 Notify RN   Glucose, capillary     Status: Abnormal   Collection Time: 05/05/18  7:01 AM  Result Value Ref Range   Glucose-Capillary 139 (H) 70 - 99 mg/dL    Blood Alcohol level:  Lab Results  Component Value Date   ETH <10  05/01/2018   ETH <10 56/38/7564    Metabolic Disorder Labs: Lab Results  Component Value Date   HGBA1C 6.9 (H) 05/04/2018   MPG 151.33 05/04/2018   MPG 214.47 12/01/2017   Lab Results  Component Value Date   PROLACTIN 36.7 (H) 12/01/2017   PROLACTIN 37.3 (H) 07/18/2015   Lab Results  Component Value Date   CHOL 169 05/04/2018   TRIG 163 (H) 05/04/2018   HDL 42 05/04/2018   CHOLHDL 4.0 05/04/2018   VLDL 33 05/04/2018   LDLCALC 94 05/04/2018   LDLCALC 147 (H) 12/01/2017    Physical Findings: AIMS:  , ,  ,  ,    CIWA:    COWS:     Musculoskeletal: Strength & Muscle Tone: within normal limits Gait & Station: normal Patient leans: N/A  Psychiatric Specialty Exam: Physical Exam  Nursing note and vitals reviewed. Psychiatric: Her speech is normal. Her affect is blunt. She is withdrawn. Thought content is delusional. Cognition and memory are normal. She expresses impulsivity.    Review of Systems  Neurological: Negative.   Psychiatric/Behavioral: Negative.   All other systems reviewed and are negative.   Blood pressure 115/74, pulse 67, temperature 97.8 F (36.6 C), temperature source Oral, resp. rate 18, height 5\' 7"  (1.702 m), weight 105.7 kg, SpO2 100 %.Body mass index is 36.49 kg/m.  General Appearance: Casual  Eye Contact:  Good  Speech:  Clear and Coherent  Volume:  Normal  Mood:  Anxious  Affect:  Blunt  Thought Process:  Goal Directed and Descriptions of Associations: Intact  Orientation:  Full (Time, Place, and Person)  Thought Content:  Delusions and Paranoid Ideation  Suicidal Thoughts:  No  Homicidal Thoughts:  No  Memory:   Immediate;   Fair Recent;   Fair Remote;   Fair  Judgement:  Poor  Insight:  Shallow  Psychomotor Activity:  Psychomotor Retardation  Concentration:  Concentration: Fair and Attention Span: Fair  Recall:  [employer] of Knowledge:  Fair  Language:  Fair  Akathisia:  No  Handed:  Right  AIMS (if indicated):     Assets:  Communication Skills Desire for Improvement Financial Resources/Insurance Housing Physical Health Resilience Social Support  ADL's:  Intact  Cognition:  WNL  Sleep:  Number of Hours: 8.25     Treatment Plan Summary: Daily contact with patient to assess and evaluate symptoms and progress in treatment and Medication management   Ms. Berber is a 28 year old female with a history of bipolar disorder and intellectual disability who return to ER threatening suicide if not placed in a new group home. I do not believe it possible.  #Suicidal ideation, resolved -patient able to contract for safey in the hospital  #Bipolar disorder -received Abilify maintena on 1/6. Next dose on 05/30/2018 -continue Lithium 300 mg BID, patient with diabetes  #OCD -increase Luvox to 100 mg nighty  #DM -Levemir 40 units BID -ADA diet, CBG  #HTN -HCTZ 25 mg daily  #Labs -lipid panel elevatd TG, TSH nomal, A1C 6.8 -EKG -pregnancy test  #Smoking cessation -nicotine patch is available  #Disposition -discharge to her group homr -follow up with [facility] ACT team  Orson Slick, MD 05/05/2018, 3:24 PM

## 2018-05-05 NOTE — Progress Notes (Signed)
Recreation Therapy Notes    Date: 05/05/2018  Time: 9:30 am   Location: Craft room   Behavioral response: N/A   Intervention Topic: Relaxation  Discussion/Intervention: Patient did not attend group.   Clinical Observations/Feedback:  Patient did not attend group.   Schyler Butikofer LRT/CTRS          Eriberto Felch 05/05/2018 11:39 AM

## 2018-05-05 NOTE — Plan of Care (Signed)
Patient was compliant with medication administration per MD orders. Patient was isolative to her room this evening.   Problem: Medication: Goal: Compliance with prescribed medication regimen will improve Outcome: Progressing   Problem: Activity: Goal: Interest or engagement in activities will improve Outcome: Not Progressing

## 2018-05-05 NOTE — [facility] Group Notes (Signed)
Balance In Life 05/05/2018 1PM  Type of Therapy/Topic:  Group Therapy:  Balance in Life  Participation Level:  Did Not Attend  Description of Group:   This group will address the concept of balance and how it feels and looks when one is unbalanced. Patients will be encouraged to process areas in their lives that are out of balance and identify reasons for remaining unbalanced. Facilitators will guide patients in utilizing problem-solving interventions to address and correct the stressor making their life unbalanced. Understanding and applying boundaries will be explored and addressed for obtaining and maintaining a balanced life. Patients will be encouraged to explore ways to assertively make their unbalanced needs known to significant others in their lives, using other group members and facilitator for support and feedback.  Therapeutic Goals: 1. Patient will identify two or more emotions or situations they have that consume much of in their lives. 2. Patient will identify signs/triggers that life has become out of balance:  3. Patient will identify two ways to set boundaries in order to achieve balance in their lives:  4. Patient will demonstrate ability to communicate their needs through discussion and/or role plays  Summary of Patient Progress:    Therapeutic Modalities:   Cognitive Behavioral Therapy Solution-Focused Therapy Assertiveness Training  Reneshia Zuccaro Lynelle Smoke, LCSW

## 2018-05-06 LAB — GLUCOSE, CAPILLARY
Glucose-Capillary: 137 mg/dL — ABNORMAL HIGH (ref 70–99)
Glucose-Capillary: 118 mg/dL — ABNORMAL HIGH (ref 70–99)

## 2018-05-06 MED ORDER — ABILIFY MAINTENA 400 MG IM PRSY: 400.0000 mg | PREFILLED_SYRINGE | INTRAMUSCULAR | 6 refills | Status: DC

## 2018-05-06 MED ORDER — ARIPIPRAZOLE ER 400 MG IM SRER
400.0000 mg | INTRAMUSCULAR | Status: DC
  Filled 2018-05-06: qty 2

## 2018-05-06 NOTE — Progress Notes (Signed)
Patient alert and oriented x 4. Ambulates unit with steady gait. Verbally denies SI/HI/AVH and pain. Patient discharged on above date and time. Verbalized understanding the discharge information provided to patient upon discharge. Patient departed unit with discharge paperwork, prescriptions and personal belongings. Picked up by ACT team member to go back to her group home. No distress noted.

## 2018-05-06 NOTE — Discharge Summary (Addendum)
Physician Discharge Summary Note  Patient:  [name] is an 28 y.o., female MRN:  [id number] DOB:  05/18/1990 Patient phone:  [phone] (home)  Patient address:   [address] [address],  Total Time spent with patient: 20 minutes plus 15 min on care coordination and documentation  Date of Admission:  05/02/2018 Date of Discharge: 05/06/2018  Reason for Admission:  Suicidal ideation.  History of Present Illness:   Identifying data. [name] is a 28 year old female with a history of bipolar disorder and mild intellectual disability.  Chief complaint. "I hate people at my group home. They do not treat me right."  History of present illness. Information was obtained from the patient and thechart. The patient came to the ER from her hroup home reporting suicidal ideation. She has been complaining for months that the manager yells at her and wags her finger. Her room mate plays loud music day and night. She has had countless conversations with her ACT team but still resides at the same place. She denies any psychotic symptoms, they are controlled with Abilify. Apparently, she is paranoid at baseline. I winder if nighttime music represents hallucinations.. She however suffers severe OCD that has been addressed with BuSpar. She reports good medication compliance and no substances.  Past psychiatric history. Her illness started with serious suicide attempt by overdose at the age of 62. She has had multiple depressive episods but was mostly "hospitalized" at [facility] ER. Multiple medication trials.  Family psychiatric history. Mother with depression and OCD.  Social history. Dropped out of school in 11th grade special education classes. Used to live independently but 2-3 months ago insisted to be placed in a group home (OCD?). Her mother is involved in her care. The patient is her own guardian.   Principal Problem: Bipolar I disorder, most recent episode depressed, severe  without psychotic features [name]) Discharge Diagnoses: Principal Problem:   Bipolar I disorder, most recent episode depressed, severe without psychotic features ([place]) Active Problems:   Borderline intellectual functioning   Diabetes mellitus ([place])   OCD (obsessive compulsive disorder)   HTN (hypertension)   Tobacco use disorder   Past Medical History:  Past Medical History:  Diagnosis Date  . Anxiety   . Bipolar 1 disorder ([place])   . Cognitive deficits   . Depression   . Diabetes mellitus without complication ([place])   . Hypertension   . Mental disorder   . Obesity     Past Surgical History:  Procedure Laterality Date  . CESAREAN SECTION    . CESAREAN SECTION N/A 04/25/2013   Procedure: REPEAT CESAREAN SECTION;  Surgeon: [name], MD;  Location: [facility];  Service: Obstetrics;  Laterality: N/A;  . MASS EXCISION N/A 06/03/2012   Procedure: EXCISION MASS;  Surgeon: [name], MD;  Location: [facility];  Service: ENT;  Laterality: N/A;  Excision uvula mass  . TONSILLECTOMY N/A 06/03/2012   Procedure: TONSILLECTOMY;  Surgeon: [name], MD;  Location: [facility];  Service: ENT;  Laterality: N/A;  . TONSILLECTOMY     Family History:  Family History  Problem Relation Age of Onset  . Hypertension Mother   . Diabetes Father    Social History:  Social History   Substance and Sexual Activity  Alcohol Use No     Social History   Substance and Sexual Activity  Drug Use No   Comment: Patient denies    Social History   Socioeconomic History  .  Marital status: Single    Spouse name: Not on file  . Number of children: Not on file  . Years of education: Not on file  . Highest education level: Not on file  Occupational History  . Not on file  Social Needs  . Financial resource strain: Not on file  . Food insecurity:    Worry: Not on file    Inability: Not on file  . Transportation needs:    Medical: Not on file    Non-medical:  Not on file  Tobacco Use  . Smoking status: Current Every Day Smoker    Packs/day: 3.00    Years: 11.00    Pack years: 33.00    Types: Cigarettes  . Smokeless tobacco: Never Used  Substance and Sexual Activity  . Alcohol use: No  . Drug use: No    Comment: Patient denies  . Sexual activity: Yes    Birth control/protection: Implant  Lifestyle  . Physical activity:    Days per week: Not on file    Minutes per session: Not on file  . Stress: Not on file  Relationships  . Social connections:    Talks on phone: Not on file    Gets together: Not on file    Attends religious service: Not on file    Active member of club or organization: Not on file    Attends meetings of clubs or organizations: Not on file    Relationship status: Not on file  Other Topics Concern  . Not on file  Social History Narrative  . Not on file    Hospital Course:    [name] is a 28 year old female with a history of bipolar disorder and intellectual disability who came to ER threatening suicide if not placed in a new group home.  Her medications were adjusted and she tolerated changes well. At the time of discharge, the patient is no longer suicidal, homicidal or psychotic. She is able to contract for safety. She is forward thinking and optimistic about the future. Her ACT team is aggresively searching for new placement.  #Bipolar disorder, improved -received Abilify maintena 400 mg injection on 05/02/2018, next dose on 05/30/2018 -continue Lithium 300 mg BID, Li level 0.60  #OCD -continue Luvox to 100 mg nighty  #DM -Levemir 40 units BID -ADA diet, CBG  #HTN -HCTZ25 mg daily  #Labs -lipid panelelevatd TG, TSHnomal, A1C6.8 -EKG reviewed, NSR with [phone]  #Smoking cessation -nicotine patch is available  #Disposition -discharge back to her group homr -follow up with [facility] ACT team  Physical Findings: AIMS:  , ,  ,  ,    CIWA:    COWS:     Musculoskeletal: Strength & Muscle Tone:  within normal limits Gait & Station: normal Patient leans: N/A  Psychiatric Specialty Exam: Physical Exam  Nursing note and vitals reviewed. Psychiatric: She has a normal mood and affect. Her speech is normal and behavior is normal. Thought content is delusional. Cognition and memory are normal. She expresses impulsivity.    Review of Systems  Neurological: Negative.   Psychiatric/Behavioral: Negative.   All other systems reviewed and are negative.   Blood pressure 127/72, pulse 71, temperature 98.5 F (36.9 C), temperature source Oral, resp. rate 18, height 5\' 7"  (1.702 m), weight 105.7 kg, SpO2 100 %.Body mass index is 36.49 kg/m.  General Appearance: Casual  Eye Contact:  Good  Speech:  Clear and Coherent and Slow  Volume:  Normal  Mood:  Euthymic  Affect:  Blunt  Thought Process:  Goal Directed and Descriptions of Associations: Tangential  Orientation:  Full (Time, Place, and Person)  Thought Content:  Delusions  Suicidal Thoughts:  No  Homicidal Thoughts:  No  Memory:  Immediate;   Fair Recent;   Fair Remote;   Fair  Judgement:  Poor  Insight:  Lacking  Psychomotor Activity:  Decreased  Concentration:  Concentration: Fair and Attention Span: Fair  Recall:  [employer] of Knowledge:  Fair  Language:  Fair  Akathisia:  No  Handed:  Right  AIMS (if indicated):     Assets:  Communication Skills Desire for Improvement Financial Resources/Insurance Housing Physical Health Resilience Social Support  ADL's:  Intact  Cognition:  WNL  Sleep:  Number of Hours: 7.5     Have you used any form of tobacco in the last 30 days? (Cigarettes, Smokeless Tobacco, Cigars, and/or Pipes): Yes  Has this patient used any form of tobacco in the last 30 days? (Cigarettes, Smokeless Tobacco, Cigars, and/or Pipes) Yes, Yes, A prescription for an FDA-approved tobacco cessation medication was offered at discharge and the patient refused  Blood Alcohol level:  Lab Results  Component  Value Date   [facility] <10 05/01/2018   ETH <10 23/76/2831    Metabolic Disorder Labs:  Lab Results  Component Value Date   HGBA1C 6.9 (H) 05/04/2018   MPG 151.33 05/04/2018   MPG 214.47 12/01/2017   Lab Results  Component Value Date   PROLACTIN 36.7 (H) 12/01/2017   PROLACTIN 37.3 (H) 07/18/2015   Lab Results  Component Value Date   CHOL 169 05/04/2018   TRIG 163 (H) 05/04/2018   HDL 42 05/04/2018   CHOLHDL 4.0 05/04/2018   VLDL 33 05/04/2018   LDLCALC 94 05/04/2018   LDLCALC 147 (H) 12/01/2017    See Psychiatric Specialty Exam and Suicide Risk Assessment completed by Attending Physician prior to discharge.  Discharge destination:  Home  Is patient on multiple antipsychotic therapies at discharge:  No   Has Patient had three or more failed trials of antipsychotic monotherapy by history:  No  Recommended Plan for Multiple Antipsychotic Therapies: NA  Discharge Instructions    Diet - low sodium heart healthy   Complete by:  As directed    Increase activity slowly   Complete by:  As directed      Allergies as of 05/06/2018      Reactions   Wellbutrin [bupropion] Shortness Of Breath   Omnipaque [iohexol] Swelling, Other (See Comments)   Reaction:  Eye swelling   Penicillins Hives, Other (See Comments)   Has patient had a PCN reaction causing immediate rash, facial/tongue/throat swelling, SOB or lightheadedness with hypotension: No Has patient had a PCN reaction causing severe rash involving mucus membranes or skin necrosis: No Has patient had a PCN reaction that required hospitalization No Has patient had a PCN reaction occurring within the last 10 years: No If all of the above answers are "NO", then may proceed with Cephalosporin use.   Cogentin [benztropine]    Make pt feel crazy   Depakote Er [divalproex Sodium Er] Nausea And Vomiting      Medication List    STOP taking these medications   busPIRone 15 MG tablet Commonly known as:  BUSPAR     TAKE these  medications     Indication  ABILIFY MAINTENA 400 MG Prsy prefilled syringe Generic drug:  ARIPiprazole ER Inject 400 mg into the muscle every 28 (twenty-eight) days. Next dose on  05/30/2018 What changed:    how to take this  when to take this  additional instructions  Indication:  MIXED BIPOLAR AFFECTIVE DISORDER   fluvoxaMINE 100 MG tablet Commonly known as:  LUVOX Take 1 tablet (100 mg total) by mouth at bedtime.  Indication:  Obsessive Compulsive Disorder   hydrochlorothiazide 25 MG tablet Commonly known as:  HYDRODIURIL Take 25 mg by mouth daily.  Indication:  High Blood Pressure Disorder   insulin detemir 100 UNIT/ML injection Commonly known as:  LEVEMIR Inject 40 Units into the skin 2 (two) times daily.  Indication:  Insulin-Dependent Diabetes   lithium carbonate 300 MG CR tablet Commonly known as:  LITHOBID Take 300 mg by mouth 2 (two) times daily.  Indication:  Manic-Depression   nicotine polacrilex 2 MG gum Commonly known as:  NICORETTE Take 1 each (2 mg total) by mouth every hour as needed for smoking cessation.  Indication:  Nicotine Addiction      Follow-up [facility] Follow up on 05/10/2018.   Why:  Your ACT team will meete you at the group home on Tuesday, 05/10/18, in the afternoon.  Contact information: [place] [address] [phone]           Follow-up recommendations:  Activity:  as tolerated Diet:  low sodium heart healthy ADA diet Other:  keep follow up appointments  Comments:     Signed: Orson Slick, MD 05/06/2018, 2:02 PM

## 2018-05-06 NOTE — NC FL2 (Signed)
[place] LEVEL OF CARE SCREENING TOOL     IDENTIFICATION  Patient Name: [name] Birthdate: [birth year] Sex: female Admission Date (Current Location): 05/02/2018  [place] and [place] Number:  [name] [id number] [place] and Address:  [facility], [address] [place],  [address]      Provider Number: [id number]  Attending Physician Name and Address:  Clovis Fredrickson, MD  Relative Name and Phone Number:  Roniyah Llorens, mother, [phone]    Current Level of Care: Hospital Recommended Level of Care: Other (Comment)(group home) Prior Approval Number:    Date Approved/Denied:   PASRR Number:    Discharge Plan: Other (Comment)(group home)    Current Diagnoses: Patient Active Problem List   Diagnosis Date Noted  . HTN (hypertension) 05/03/2018  . Tobacco use disorder 05/03/2018  . Bipolar I disorder, most recent episode depressed, severe without psychotic features ([place]) 05/02/2018  . Adjustment disorder with emotional disturbance 01/02/2018  . 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 Moderate bipolar I disorder, most recent episode depressed ([place])   . Psychosis ([place])   . 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 LGSIL on Pap smear of cervix 02/01/2017  . OCD (obsessive compulsive disorder) 10/05/2016  . Major depressive disorder, recurrent episode, mild ([place]) 05/04/2016  . 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 Learning disability 07/18/2015  . 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 Diabetes mellitus ([place]) 07/18/2015  . MDD (major depressive disorder), recurrent, severe, with psychosis ([place]) 07/18/2015  . Hyperlipidemia 07/18/2015  . Severe episode of recurrent major depressive disorder, without psychotic features ([place])   . Drug overdose   . Cognitive deficits 10/12/2012  . Generalized anxiety disorder 06/28/2012    Orientation RESPIRATION BLADDER  Height & Weight     Self, Time, Situation, Place  Normal Continent Weight: 105.7 kg Height:  5\' 7"  (170.2 cm)  BEHAVIORAL SYMPTOMS/MOOD NEUROLOGICAL BOWEL NUTRITION STATUS  (none) (none) Continent Diet(diabetic)  AMBULATORY STATUS COMMUNICATION OF NEEDS Skin   Independent Verbally Normal                       Personal Care Assistance Level of Assistance  (none)           Functional Limitations Info             SPECIAL CARE FACTORS FREQUENCY  Blood pressure                    Contractures Contractures Info: Not present    Additional Factors Info  (full code)               Current Medications (05/06/2018):  This is the current hospital active medication list Current Facility-Administered Medications  Medication Dose Route Frequency Provider Last Rate Last Dose  . acetaminophen (TYLENOL) tablet 650 mg  650 mg Oral Q6H PRN Lavella Hammock, MD      . alum & mag hydroxide-simeth (MAALOX/MYLANTA) 200-200-20 MG/5ML suspension 30 mL  30 mL Oral Q4H PRN Lavella Hammock, MD      . Derrill Memo ON 05/30/2018] ARIPiprazole ER (ABILIFY MAINTENA) injection 400 mg  400 mg Intramuscular Q28 days Pucilowska, Jolanta B, MD      . fluvoxaMINE (LUVOX) tablet 100 mg  100 mg Oral QHS Pucilowska, Jolanta B, MD   100 mg at 05/05/18 2137  . insulin detemir (LEVEMIR) injection 40 Units  40 Units Subcutaneous BID Pucilowska, Jolanta B, MD   40 Units  at 05/06/18 0746  . lithium carbonate (LITHOBID) CR tablet 300 mg  300 mg Oral BID Pucilowska, Jolanta B, MD   300 mg at 05/06/18 0746  . magnesium hydroxide (MILK OF MAGNESIA) suspension 30 mL  30 mL Oral Daily PRN Lavella Hammock, MD      . nicotine (NICODERM CQ - dosed in mg/24 hours) patch 14 mg  14 mg Transdermal Daily Pucilowska, Jolanta B, MD         Discharge Medications: Please see discharge summary for a list of discharge medications.  Relevant Imaging Results:  Relevant Lab Results:   Additional Information    Joanne Chars, LCSW

## 2018-05-06 NOTE — Progress Notes (Signed)
D - Patient was in her room upon arrival to the unit. Patient was pleasant during assessment and medication administration but had a flat affect. Patient denies HI/AVH, pain and. Patient stated she had some anxiety and depression today but also stated she had a good day. Patient denies SI with this Probation officer.   A - Patient compliant with medication administration per MD orders and procedures on the unit. Patient given education. Patient given support and encouragement to attend group and be active in her treatment plan. Patient informed to let staff know if there are any issues or problems on the unit.   R - Patient being monitored Q 15 minutes for safety per unit protocol. Patient remains safe on the unit.

## 2018-05-06 NOTE — Plan of Care (Signed)
Patient was compliant with medication administration per MD orders. Patient said she had a good day but that she didn't want to leave tomorrow and go back to her old group home.   Problem: Medication: Goal: Compliance with prescribed medication regimen will improve Outcome: Progressing   Problem: Education: Goal: Emotional status will improve Outcome: Not Progressing

## 2018-05-06 NOTE — Progress Notes (Signed)
Recreation Therapy Notes  INPATIENT RECREATION TR PLAN  Patient Details Name: [name] MRN: [id number] DOB: December 15, 1990 Today's Date: 05/06/2018  Rec Therapy Plan Is patient appropriate for Therapeutic Recreation?: Yes Treatment times per week: at least 3 Estimated Length of Stay: 5-7 days TR Treatment/Interventions: Group participation (Comment)  Discharge Criteria Pt will be discharged from therapy if:: Discharged Treatment plan/goals/alternatives discussed and agreed upon by:: Patient/family  Discharge Summary Short term goals set: Patient will engage in groups without prompting or encouragement from LRT x3 group sessions within 5 recreation therapy group sessions Short term goals met: Not met Reason goals not met: Patient did not attend any groups Therapeutic equipment acquired: N/A Reason patient discharged from therapy: Discharge from hospital Pt/family agrees with progress & goals achieved: Yes Date patient discharged from therapy: 05/06/18   Cieanna Stormes 05/06/2018, 11:06 AM

## 2018-05-06 NOTE — Progress Notes (Signed)
  [facility] Adult Case Management Discharge Plan :  Will you be returning to the same living situation after discharge:  Yes,  group home At discharge, do you have transportation home?: Yes,  group home or ACT team Do you have the ability to pay for your medications: Yes,  medicaid  Release of information consent forms completed and in the chart;  Patient's signature needed at discharge.  Patient to Follow up at: Follow-up [facility] Follow up on 05/10/2018.   Why:  Your ACT team will meete you at the group home on Tuesday, 05/10/18, in the afternoon.  Contact information: [place] [address] [phone]           Next level of care provider has access to [place] and Suicide Prevention discussed: No. Attempts made.  Have you used any form of tobacco in the last 30 days? (Cigarettes, Smokeless Tobacco, Cigars, and/or Pipes): Yes  Has patient been referred to the Quitline?: Patient refused referral  Patient has been referred for addiction treatment: N/A  Joanne Chars, LCSW 05/06/2018, 11:35 AM

## 2018-05-06 NOTE — Progress Notes (Signed)
Recreation Therapy Notes  Date: 05/06/2018  Time: 9:30 am   Location: Craft room   Behavioral response: N/A   Intervention Topic: Leisure  Discussion/Intervention: Patient did not attend group.   Clinical Observations/Feedback:  Patient did not attend group.   Michal Strzelecki LRT/CTRS        Baileigh Modisette 05/06/2018 10:29 AM

## 2018-05-19 ENCOUNTER — Encounter: Payer: Self-pay | Admitting: Obstetrics and Gynecology

## 2018-05-19 ENCOUNTER — Other Ambulatory Visit (HOSPITAL_COMMUNITY)
Admission: RE | Admit: 2018-05-19 | Discharge: 2018-05-19 | Disposition: A | Discharge status: Home / Self Care | Payer: Medicaid Other | Source: Ambulatory Visit | Attending: Obstetrics and Gynecology | Admitting: Obstetrics and Gynecology

## 2018-05-19 ENCOUNTER — Ambulatory Visit (INDEPENDENT_AMBULATORY_CARE_PROVIDER_SITE_OTHER): Payer: Medicaid Other | Admitting: Obstetrics and Gynecology

## 2018-05-19 VITALS — BP 126/87 | HR 87 | Ht 67.0 in | Wt 230.0 lb

## 2018-05-19 DIAGNOSIS — B001 Herpesviral vesicular dermatitis: Secondary | ICD-10-CM | POA: Diagnosis not present

## 2018-05-19 DIAGNOSIS — Z113 Encounter for screening for infections with a predominantly sexual mode of transmission: Secondary | ICD-10-CM

## 2018-05-19 DIAGNOSIS — Z3202 Encounter for pregnancy test, result negative: Secondary | ICD-10-CM

## 2018-05-19 DIAGNOSIS — N898 Other specified noninflammatory disorders of vagina: Secondary | ICD-10-CM | POA: Insufficient documentation

## 2018-05-19 DIAGNOSIS — N879 Dysplasia of cervix uteri, unspecified: Secondary | ICD-10-CM

## 2018-05-19 LAB — POCT PREGNANCY, URINE: Preg Test, Ur: NEGATIVE

## 2018-05-19 NOTE — Procedures (Signed)
Colposcopy Procedure Note  Pre-operative Diagnosis:  02/2018: LSIL/HR+ but HR HPV negative 02/2017 colpo (adequate): biopsies negative and ecc negative 12/2016: LSIL 09/2012: NILM  Post-operative Diagnosis: CIN 1  Procedure Details  LMP early Jana[place], no current bleeding; UPT neg.   The risks (including infection, bleeding, pain) and benefits of the procedure were explained to the patient and written informed consent was obtained.  The patient was placed in the dorsal lithotomy position. A very long Graves speculum inserted in the vagina, and the cervix was visualized.  AA staining done Lugol's with green filter.  Biopsy or ECC not done. No bleeding after procedure  Findings: diffuse AWE changes circumferentially  Adequate: yes  Specimens: none  Condition: Stable  Complications: None  Plan: The patient was advised to call for any fever or for prolonged or severe pain or bleeding. She was advised to use OTC analgesics as needed for mild to moderate pain. She was advised to avoid vaginal intercourse for 48 hours or until the bleeding has completely stopped.   Patient was scheduled for AUB, ?hyst consult but missed her colpo visit due to being admitted at [facility]. Will bring back for d/w pt. Pt told to rpt pap in one year  Patient has ?cold sore on mouth and would like full STI testing today. No h/o cold sores  Durene Romans MD Attending Center for [employer] [facility])

## 2018-05-20 LAB — CERVICOVAGINAL ANCILLARY ONLY
Bacterial vaginitis: NEGATIVE
Candida vaginitis: NEGATIVE
Chlamydia: NEGATIVE
NEISSERIA GONORRHEA: NEGATIVE
Trichomonas: NEGATIVE

## 2018-05-23 ENCOUNTER — Telehealth: Payer: Self-pay | Admitting: Family Medicine

## 2018-05-23 NOTE — Telephone Encounter (Signed)
Patient returning a call. °

## 2018-05-24 LAB — HEPATITIS C ANTIBODY (REFLEX): HCV Ab: <0.1 s/co ratio (ref 0.0–0.9)

## 2018-05-24 LAB — HSV(HERPES SMPLX)ABS-I+II(IGG+IGM)-BLD
HSV 1 Glycoprotein G Ab, IgG: >62.2 index — ABNORMAL HIGH (ref 0.00–0.90)
HSVI/II Comb IgM: 1.06 Ratio — ABNORMAL HIGH (ref 0.00–0.90)

## 2018-05-24 LAB — HIV ANTIBODY (ROUTINE TESTING W REFLEX): HIV Screen 4th Generation wRfx: NONREACTIVE

## 2018-05-24 LAB — RPR: RPR Ser Ql: NONREACTIVE

## 2018-05-24 LAB — HEPATITIS B SURFACE ANTIGEN: Hepatitis B Surface Ag: NEGATIVE

## 2018-05-24 LAB — HCV COMMENT:

## 2018-05-24 MED ORDER — VALACYCLOVIR HCL 1 G PO TABS: 1000.0000 mg | ORAL_TABLET | Freq: Every day | ORAL | 1 refills | Status: DC

## 2018-05-24 NOTE — Telephone Encounter (Signed)
Erroneous encounter - wrong pt

## 2018-05-24 NOTE — Addendum Note (Signed)
Addended by: Aletha Halim on: 05/24/2018 01:22 PM   Modules accepted: Orders

## 2018-05-26 ENCOUNTER — Ambulatory Visit: Payer: Self-pay | Admitting: Obstetrics and Gynecology

## 2018-06-07 IMAGING — CT CT HEAD W/O CM
4 series · 17 of 47 positions shown, 19 images · non-contrast
Comparison: 06/04/2015

CLINICAL DATA: Dizziness, lightheadedness and instability when
standing beginning this morning, history of diabetes mellitus,
smoking

EXAM:
CT HEAD WITHOUT CONTRAST
TECHNIQUE: Contiguous axial images were obtained from the base of the skull
through the vertex without intravenous contrast.

[Series 2: head without · axial · non-contrast · 0.42mm/px · z∈[-72,+48]mm · 7 of 32 slices shown, 9 images]
[im 4/32  brain]
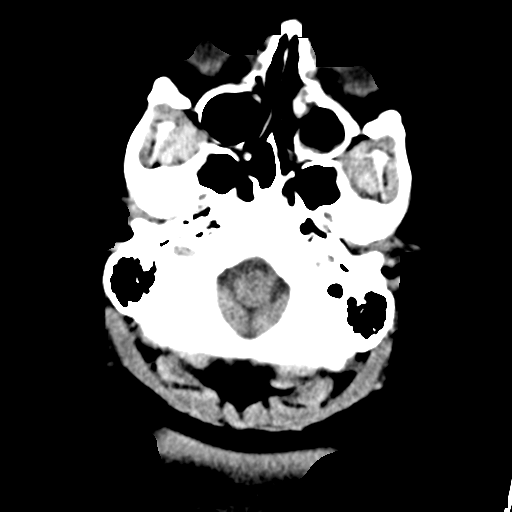
[im 4/32  bone]
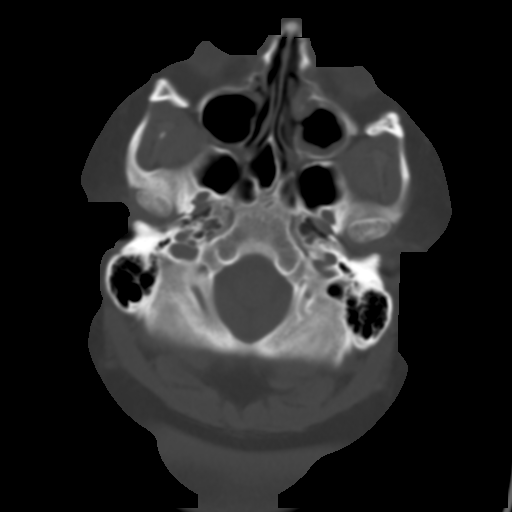
[im 8/32  brain]
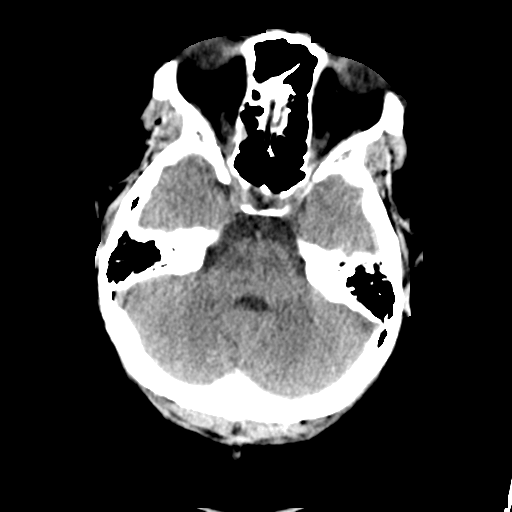
[im 12/32  brain]
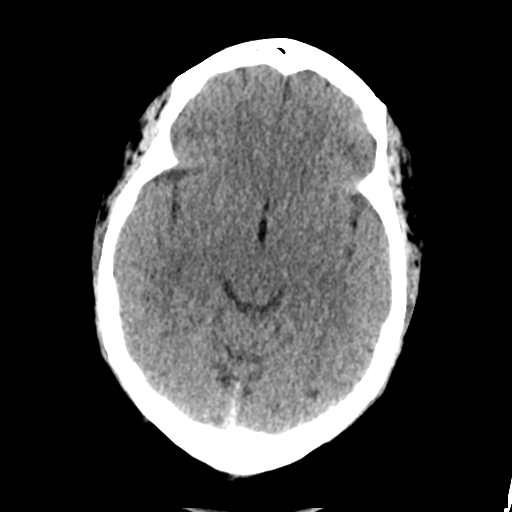
[im 16/32  brain]
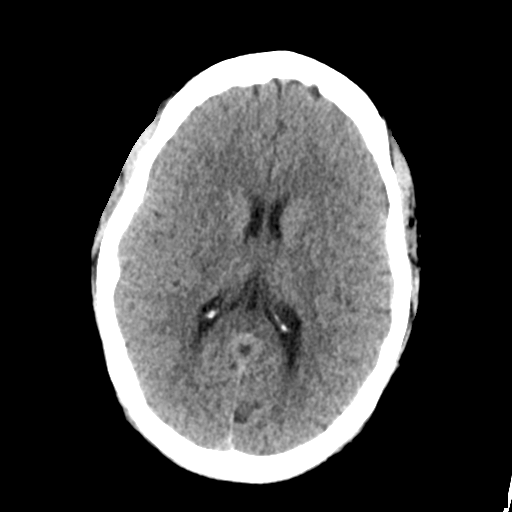
[im 20/32  brain]
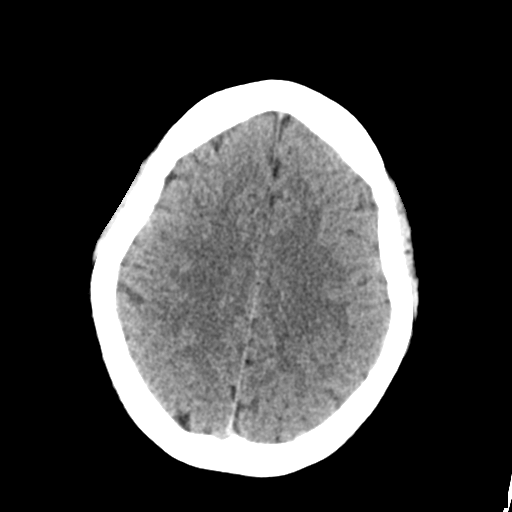
[im 20/32  bone]
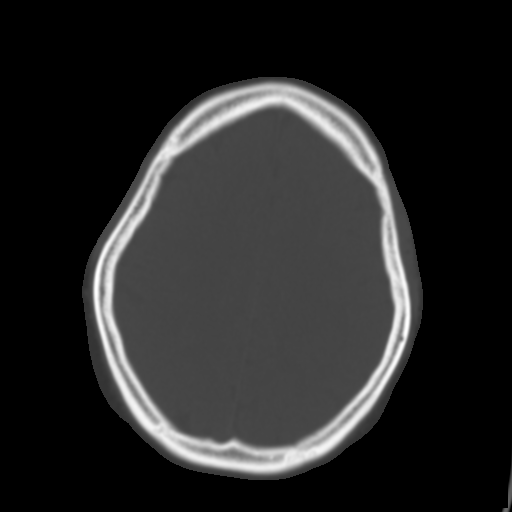
[im 24/32  brain]
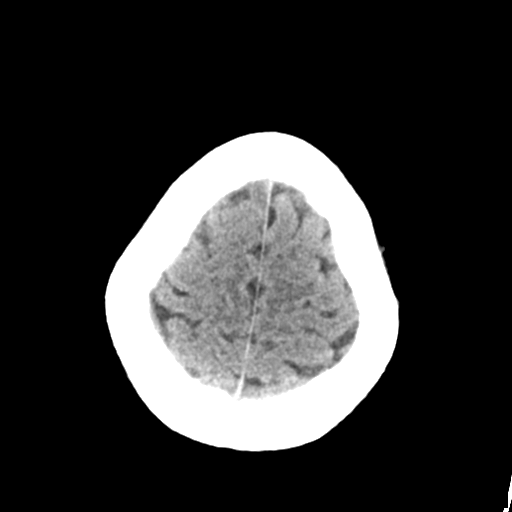
[im 28/32  brain]
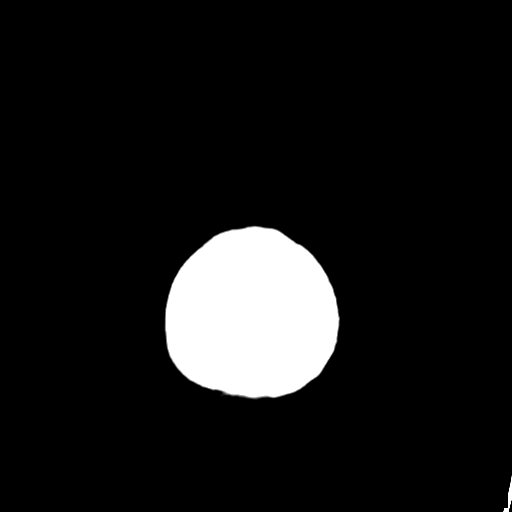

[Series 3: head bone · axial · 0.42mm/px · z∈[-73,-17]mm · 4 of 80 slices shown]
[im 8/80  bone]
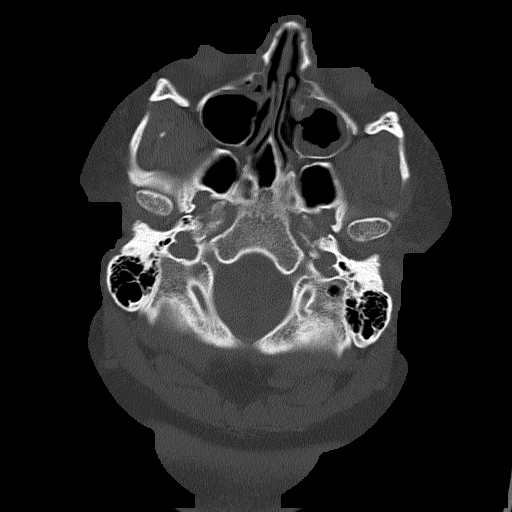
[im 16/80  bone]
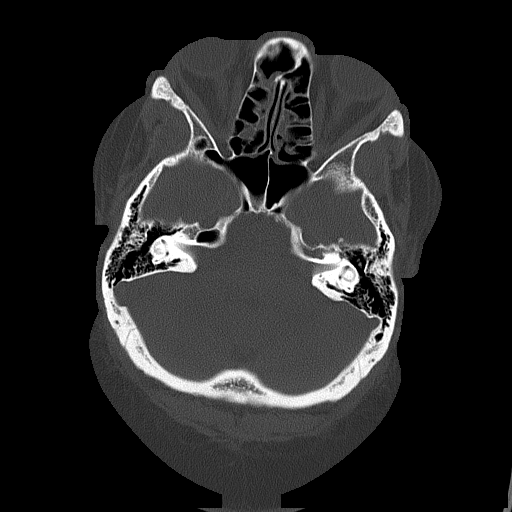
[im 24/80  bone]
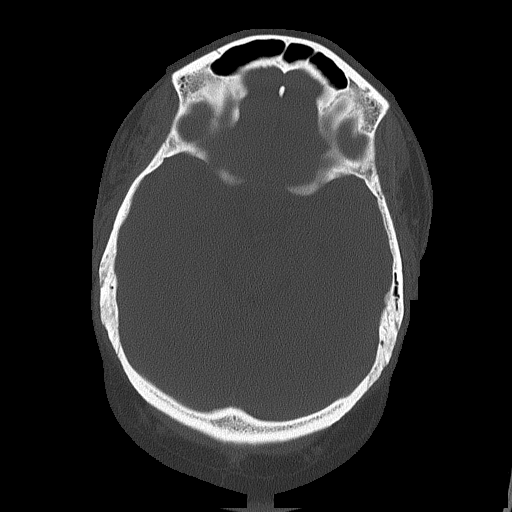
[im 36/80  bone]
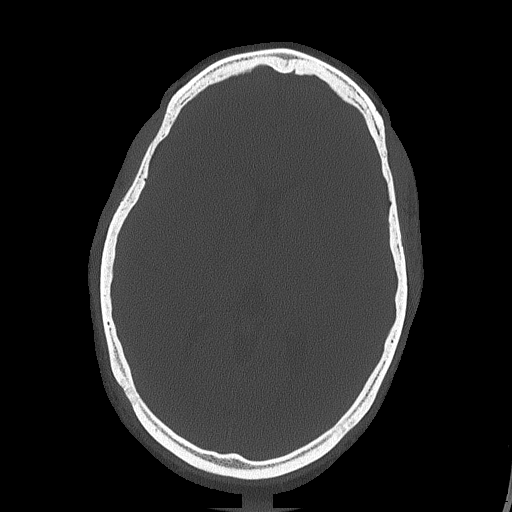

[Series 4: head without cor · coronal · non-contrast · 0.28mm/px · 3 of 67 slices shown]
[im 23/67  brain]
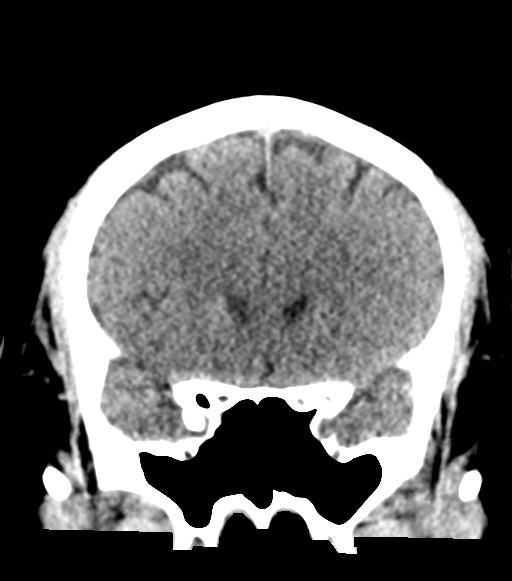
[im 30/67  brain]
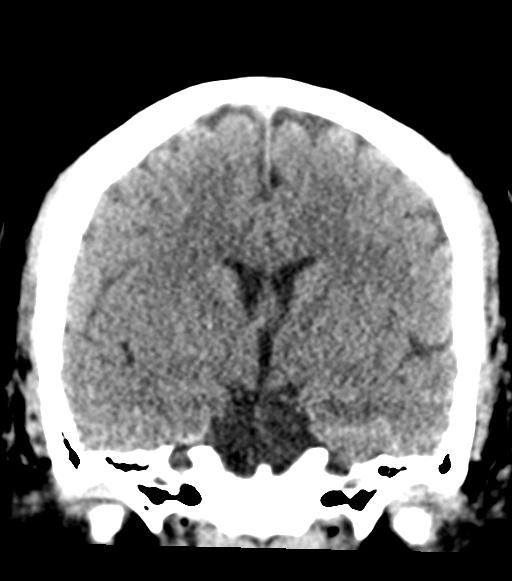
[im 37/67  brain]
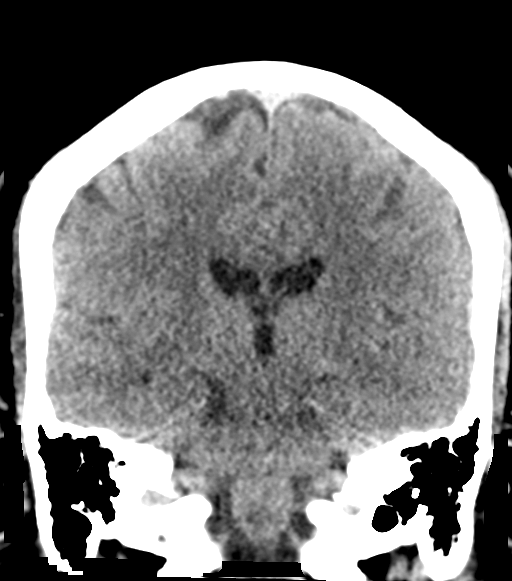

[Series 5: head without sag · sagittal · non-contrast · 0.31mm/px · 3 of 67 slices shown]
[im 23/67  brain]
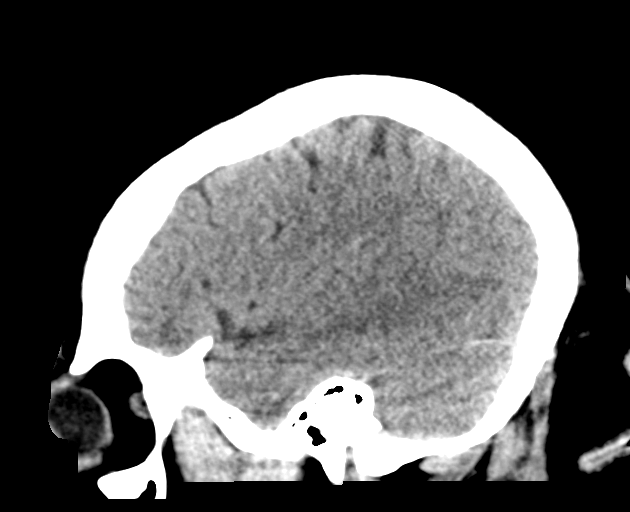
[im 34/67  brain]
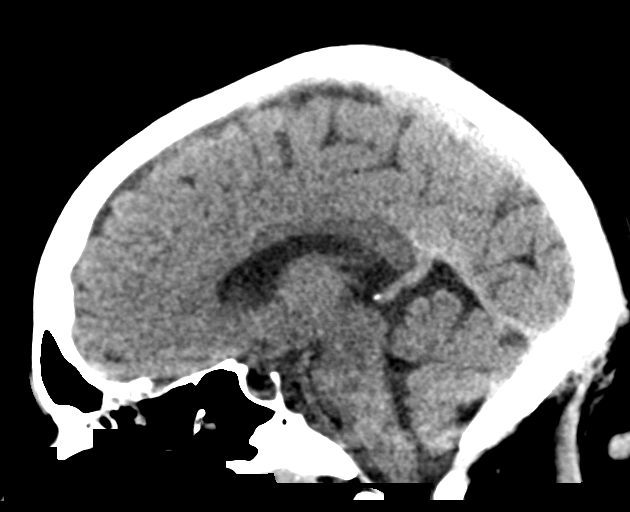
[im 45/67  brain]
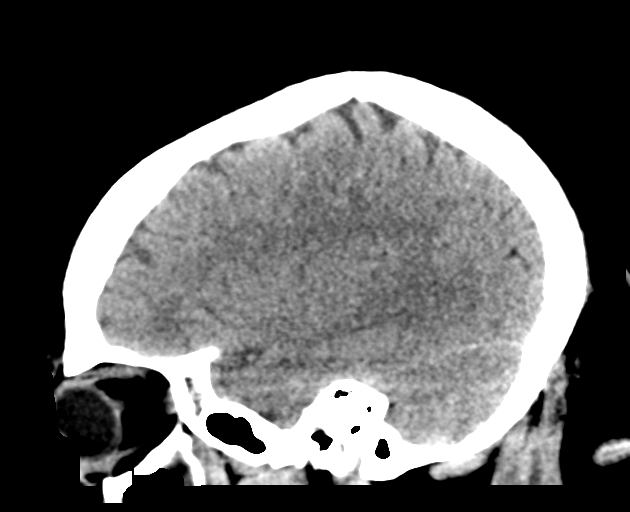

[17 of 47 positions shown; findings below may reference images not displayed]

FINDINGS: Normal ventricular morphology.

No midline shift or mass effect.

Normal appearance of brain parenchyma.

No intracranial hemorrhage, mass lesion, evidence of acute
infarction or extra-axial fluid collection.

Mucosal retention cyst RIGHT maxillary sinus.

Scattered mucosal thickening in LEFT maxillary sinus and ethmoid air
cells.

No acute osseous abnormalities.
IMPRESSION: No acute intracranial abnormalities.

## 2018-06-12 ENCOUNTER — Emergency Department
Admission: EM | Admit: 2018-06-12 | Discharge: 2018-06-13 | Disposition: A | Discharge status: Home / Self Care | Payer: Medicaid Other | Attending: Student in an Organized Health Care Education/Training Program | Admitting: Student in an Organized Health Care Education/Training Program

## 2018-06-12 ENCOUNTER — Other Ambulatory Visit: Payer: Self-pay

## 2018-06-12 DIAGNOSIS — Z794 Long term (current) use of insulin: Secondary | ICD-10-CM | POA: Insufficient documentation

## 2018-06-12 DIAGNOSIS — Z880 Allergy status to penicillin: Secondary | ICD-10-CM | POA: Insufficient documentation

## 2018-06-12 DIAGNOSIS — R45851 Suicidal ideations: Secondary | ICD-10-CM | POA: Insufficient documentation

## 2018-06-12 DIAGNOSIS — F22 Delusional disorders: Secondary | ICD-10-CM | POA: Insufficient documentation

## 2018-06-12 DIAGNOSIS — F70 Mild intellectual disabilities: Secondary | ICD-10-CM | POA: Insufficient documentation

## 2018-06-12 DIAGNOSIS — F319 Bipolar disorder, unspecified: Secondary | ICD-10-CM

## 2018-06-12 DIAGNOSIS — R45850 Homicidal ideations: Secondary | ICD-10-CM | POA: Insufficient documentation

## 2018-06-12 DIAGNOSIS — Z590 Homelessness: Secondary | ICD-10-CM | POA: Diagnosis not present

## 2018-06-12 DIAGNOSIS — F250 Schizoaffective disorder, bipolar type: Secondary | ICD-10-CM | POA: Diagnosis not present

## 2018-06-12 DIAGNOSIS — F17200 Nicotine dependence, unspecified, uncomplicated: Secondary | ICD-10-CM | POA: Insufficient documentation

## 2018-06-12 DIAGNOSIS — E119 Type 2 diabetes mellitus without complications: Secondary | ICD-10-CM | POA: Diagnosis not present

## 2018-06-12 DIAGNOSIS — I10 Essential (primary) hypertension: Secondary | ICD-10-CM | POA: Insufficient documentation

## 2018-06-12 DIAGNOSIS — Z046 Encounter for general psychiatric examination, requested by authority: Secondary | ICD-10-CM | POA: Diagnosis present

## 2018-06-12 DIAGNOSIS — Z915 Personal history of self-harm: Secondary | ICD-10-CM | POA: Diagnosis not present

## 2018-06-12 DIAGNOSIS — Z79899 Other long term (current) drug therapy: Secondary | ICD-10-CM | POA: Insufficient documentation

## 2018-06-12 HISTORY — DX: Mental disorder, not otherwise specified: F99

## 2018-06-12 LAB — CBC WITH DIFFERENTIAL/PLATELET
ABS IMMATURE GRANULOCYTES: 0.06 10*3/uL (ref 0.00–0.07)
BASOS ABS: 0.1 10*3/uL (ref 0.0–0.1)
Basophils Relative: 1 %
Eosinophils Absolute: 0.8 10*3/uL — ABNORMAL HIGH (ref 0.0–0.5)
Eosinophils Relative: 6 %
HCT: 41 % (ref 36.0–46.0)
Hemoglobin: 12.5 g/dL (ref 12.0–15.0)
IMMATURE GRANULOCYTES: 0 %
Lymphocytes Relative: 27 %
Lymphs Abs: 3.8 10*3/uL (ref 0.7–4.0)
MCH: 26.4 pg (ref 26.0–34.0)
MCHC: 30.5 g/dL (ref 30.0–36.0)
MCV: 86.7 fL (ref 80.0–100.0)
Monocytes Absolute: 0.7 10*3/uL (ref 0.1–1.0)
Monocytes Relative: 5 %
NEUTROS ABS: 8.9 10*3/uL — AB (ref 1.7–7.7)
Neutrophils Relative %: 61 %
Platelets: 315 10*3/uL (ref 150–400)
RBC: 4.73 MIL/uL (ref 3.87–5.11)
RDW: 13.7 % (ref 11.5–15.5)
WBC: 14.4 10*3/uL — ABNORMAL HIGH (ref 4.0–10.5)
nRBC: 0 % (ref 0.0–0.2)

## 2018-06-12 LAB — COMPREHENSIVE METABOLIC PANEL
ALT: 20 U/L (ref 0–44)
AST: 28 U/L (ref 15–41)
Albumin: 4.4 g/dL (ref 3.5–5.0)
Alkaline Phosphatase: 39 U/L (ref 38–126)
Anion gap: 10 (ref 5–15)
BUN: 7 mg/dL (ref 6–20)
CHLORIDE: 110 mmol/L (ref 98–111)
CO2: 24 mmol/L (ref 22–32)
Calcium: 9.4 mg/dL (ref 8.9–10.3)
Creatinine, Ser: 0.88 mg/dL (ref 0.44–1.00)
GFR calc Af Amer: >60 mL/min (ref >60)
GFR calc non Af Amer: >60 mL/min (ref >60)
Glucose, Bld: 129 mg/dL — ABNORMAL HIGH (ref 70–99)
Potassium: 3.4 mmol/L — ABNORMAL LOW (ref 3.5–5.1)
SODIUM: 144 mmol/L (ref 135–145)
Total Bilirubin: 0.5 mg/dL (ref 0.3–1.2)
Total Protein: 7.5 g/dL (ref 6.5–8.1)

## 2018-06-12 LAB — URINE DRUG SCREEN, QUALITATIVE (ARMC ONLY)
Amphetamines, Ur Screen: NOT DETECTED
Barbiturates, Ur Screen: NOT DETECTED
Benzodiazepine, Ur Scrn: NOT DETECTED
COCAINE METABOLITE, UR ~~LOC~~: NOT DETECTED
Cannabinoid 50 Ng, Ur ~~LOC~~: NOT DETECTED
MDMA (Ecstasy)Ur Screen: NOT DETECTED
Methadone Scn, Ur: NOT DETECTED
Opiate, Ur Screen: NOT DETECTED
Phencyclidine (PCP) Ur S: NOT DETECTED
Tricyclic, Ur Screen: NOT DETECTED

## 2018-06-12 LAB — ETHANOL: Alcohol, Ethyl (B): <10 mg/dL (ref <10)

## 2018-06-12 LAB — POCT PREGNANCY, URINE: Preg Test, Ur: NEGATIVE

## 2018-06-12 LAB — SALICYLATE LEVEL: Salicylate Lvl: <7 mg/dL (ref 2.8–30.0)

## 2018-06-12 LAB — ACETAMINOPHEN LEVEL: Acetaminophen (Tylenol), Serum: <10 ug/mL — ABNORMAL LOW (ref 10–30)

## 2018-06-12 LAB — LITHIUM LEVEL: Lithium Lvl: 0.7 mmol/L (ref 0.60–1.20)

## 2018-06-12 MED ORDER — DIPHENHYDRAMINE HCL 50 MG/ML IJ SOLN
50.0000 mg | Freq: Once | INTRAMUSCULAR | Status: AC
  Administered 2018-06-12: 50 mg via INTRAMUSCULAR
  Filled 2018-06-12: qty 1

## 2018-06-12 MED ORDER — LORAZEPAM 2 MG/ML IJ SOLN
2.0000 mg | Freq: Once | INTRAMUSCULAR | Status: AC
  Administered 2018-06-12: 2 mg via INTRAMUSCULAR
  Filled 2018-06-12: qty 1

## 2018-06-12 MED ORDER — LORAZEPAM 1 MG PO TABS: 1.0000 mg | ORAL_TABLET | Freq: Four times a day (QID) | ORAL | Status: DC | PRN

## 2018-06-12 MED ORDER — HALOPERIDOL LACTATE 5 MG/ML IJ SOLN
5.0000 mg | Freq: Once | INTRAMUSCULAR | Status: AC
  Administered 2018-06-12: 5 mg via INTRAMUSCULAR
  Filled 2018-06-12: qty 1

## 2018-06-12 NOTE — ED Triage Notes (Addendum)
Pt is screaming in triage and refusing to calm down and talk to me - she laid down in the floor and rolled around while yelling and demanding help Pt reports that she is suicidal and she will go to jail or kill herself before she continues to live on the street - very hard to assess for the screaming

## 2018-06-12 NOTE — ED Notes (Addendum)
First nurse note: pt brought voluntary by [employer]. Pt attempting to run through lobby, screaming, crying, laying down on floor and kicking feet. Pt assisted to wheelchair by [employer] and lobby officer, brought to triage 2. Agricultural consultant notified.

## 2018-06-12 NOTE — ED Notes (Signed)
Pt yelling upon arrival to room 21.  EDP at bedside. IM medications administered as ordered.  Pt given phone to call her mother and ice water. Maintained on 15 minute checks and observation by security camera for safety.
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
## 2018-06-12 NOTE — ED Notes (Signed)
First RN, Elmo Putt, called this RN requesting room for pt. This RN could hear pt on the phone yelling and screaming while in triage. Per Elmo Putt, pt being uncooperative at this time and will have to be brought back to room. Dr. Quentin Cornwall informed and verbal orders given for Benadryl 50 mg IM, Haldol 5 mg IM, and ativan 2 mg IM

## 2018-06-12 NOTE — ED Provider Notes (Signed)
[facility] Emergency Department Provider Note    First MD Initiated Contact with Patient 06/12/18 1749     (approximate)  I have reviewed the triage vital signs and the nursing notes.   HISTORY  Chief Complaint Psychiatric Evaluation  Level V Caveat:  AMS - acute psychosis  HPI [name] is a 28 y.o. female with a history of bipolar disorder on lithium as well as several other antipsychotic medications presents the ER via [facility] [place] police department after initial call out for being suicidal.  Patient arrives the ER agitated very difficult to redirect.  She appears very paranoid pacing back and forth in the ER and is yelling at the top of her long.  Will intermittently calm down and say that she feels like her meds are working but unable to provide any additional history.      Past Medical History:  Diagnosis Date  . Diabetes mellitus without complication ([place])   . Hypertension   . Mental health disorder    No family history on file. History reviewed. No pertinent surgical history. There are no active problems to display for this patient.     Prior to Admission medications   Not on File    Allergies Penicillins    Social History Social History   Tobacco Use  . Smoking status: Current Every Day Smoker    Packs/day: 2.00  . Smokeless tobacco: Never Used  Substance Use Topics  . Alcohol use: Never    Frequency: Never  . Drug use: Never    Review of Systems Patient denies headaches, rhinorrhea, blurry vision, numbness, shortness of breath, chest pain, edema, cough, abdominal pain, nausea, vomiting, diarrhea, dysuria, fevers, rashes or hallucinations unless otherwise stated above in HPI. ____________________________________________   PHYSICAL EXAM:  VITAL SIGNS: Vitals:   06/12/18 1739  BP: (!) 162/70  Pulse: (!) 112  Resp: (!) 26  Temp: 98.3 F (36.8 C)  SpO2: 99%    Constitutional: Alert and oriented.  Agitated and  anxious appearing pacing back-and-forth in the ER bedroom Eyes: Conjunctivae are normal.  Head: Atraumatic. Nose: No congestion/rhinnorhea. Mouth/Throat: Mucous membranes are moist.   Neck: No stridor. Painless ROM.  Cardiovascular: Normal rate, regular rhythm. Grossly normal heart sounds.  Good peripheral circulation. Respiratory: Normal respiratory effort.  No retractions. Lungs CTAB. Gastrointestinal: Soft and nontender. No distention. No abdominal bruits. No CVA tenderness. Genitourinary:  Musculoskeletal: No lower extremity tenderness nor edema.  No joint effusions. Neurologic:  Normal speech and language. No gross focal neurologic deficits are appreciated. No facial droop Skin:  Skin is warm, dry and intact. No rash noted. Psychiatric: Very anxious and agitated.  Disorganized and paranoid appearing.  Very difficult to follow train of thought. ____________________________________________   LABS (all labs ordered are listed, but only abnormal results are displayed)  Results for orders placed or performed during the hospital encounter of 06/12/18 (from the past 24 hour(s))  CBC with Diff     Status: Abnormal (Preliminary result)   Collection Time: 06/12/18  5:47 PM  Result Value Ref Range   WBC PENDING 4.0 - 10.5 K/uL   RBC 4.73 4.22 - 5.81 MIL/uL   Hemoglobin 12.5 (L) 13.0 - 17.0 g/dL   HCT 41.0 39.0 - 52.0 %   MCV 86.7 80.0 - 100.0 fL   MCH 26.4 26.0 - 34.0 pg   MCHC 30.5 30.0 - 36.0 g/dL   RDW 13.7 11.5 - 15.5 %   Platelets 315 150 - 400 K/uL  nRBC PENDING 0.0 - 0.2 %   Neutrophils Relative % PENDING %   Neutro Abs PENDING 1.7 - 7.7 K/uL   Band Neutrophils PENDING %   Lymphocytes Relative PENDING %   Lymphs Abs PENDING 0.7 - 4.0 K/uL   Monocytes Relative PENDING %   Monocytes Absolute PENDING 0.1 - 1.0 K/uL   Eosinophils Relative PENDING %   Eosinophils Absolute PENDING 0.0 - 0.5 K/uL   Basophils Relative PENDING %   Basophils Absolute PENDING 0.0 - 0.1 K/uL   WBC  Morphology PENDING    RBC Morphology PENDING    Smear Review PENDING    Other PENDING %   nRBC PENDING 0 /100 WBC   Metamyelocytes Relative PENDING %   Myelocytes PENDING %   Promyelocytes Relative PENDING %   Blasts PENDING %   ____________________________________________ ____________________________________________  RADIOLOGY   ____________________________________________   PROCEDURES  Procedure(s) performed:  .Critical Care Performed by: Merlyn Lot, MD Authorized by: Merlyn Lot, MD   Critical care provider statement:    Critical care time (minutes):  30   Critical care time was exclusive of:  Separately billable procedures and treating other patients   Critical care was necessary to treat or prevent imminent or life-threatening deterioration of the following conditions:  CNS failure or compromise   Critical care was time spent personally by me on the following activities:  Development of treatment plan with patient or surrogate, discussions with consultants, evaluation of patient's response to treatment, examination of patient, obtaining history from patient or surrogate, ordering and performing treatments and interventions, ordering and review of laboratory studies, ordering and review of radiographic studies, pulse oximetry, re-evaluation of patient's condition and review of old charts      Critical Care performed: yes   ____________________________________________   INITIAL IMPRESSION / [place] / ED COURSE  Pertinent labs & imaging results that were available during my care of the patient were reviewed by me and considered in my medical decision making (see chart for details).   DDX: Psychosis, delirium, medication effect, noncompliance, polysubstance abuse, Si, Hi, depression   [name] is a 28 y.o. who presents to the ED with for evaluation of SI.  Patient has psych history of bipolay disorder.  Patient very agitated difficult to  redirect.  Patient appears acutely psychotic therefore for her safety so that we can exclude other metabolic process or underlying medical condition will provide calming agent in the form of Benadryl, Haldol and Versed IM.  Will monitor her.  Laboratory testing was ordered to evaluation for underlying electrolyte derangement or signs of underlying organic pathology to explain today's presentation.  Based on history and physical and laboratory evaluation, it appears that the patient's presentation is 2/2 underlying psychiatric disorder and will require further evaluation and management by inpatient psychiatry.  Patient was made an IVC due to acute psychosis and SI.  Disposition pending psychiatric evaluation.       As part of my medical decision making, I reviewed the following data within the [place] notes reviewed and incorporated, Labs reviewed, notes from prior ED visits and [place] Controlled Substance Database   ____________________________________________   FINAL CLINICAL IMPRESSION(S) / ED DIAGNOSES  Final diagnoses:  Suicidal ideation  Paranoia ([place])      NEW MEDICATIONS STARTED DURING THIS VISIT:  New Prescriptions   No medications on file     Note:  This document was prepared using Dragon voice recognition software and may include unintentional dictation  errors.    Merlyn Lot, MD 06/12/18 Pauline Aus

## 2018-06-12 NOTE — ED Notes (Signed)
TTS received a call from [facility] (Dianna).  She reports that [name] had contacted the office earlier and was expressing that she was having crying spells.  Dianna reported that [name] is from [place] and was placed in a group home in [place].  Approximately a week ago, [name] left her group home, and is currently looking for some place to live.   Dianna requests that [facility] be contacted prior to discharge or admission.

## 2018-06-12 NOTE — ED Notes (Signed)
Gave [place] tray water and gngerale.AS

## 2018-06-13 ENCOUNTER — Ambulatory Visit: Payer: Self-pay | Admitting: Obstetrics and Gynecology

## 2018-06-13 DIAGNOSIS — F319 Bipolar disorder, unspecified: Secondary | ICD-10-CM

## 2018-06-13 MED ORDER — INSULIN DETEMIR 100 UNIT/ML ~~LOC~~ SOLN
40.0000 [IU] | Freq: Every day | SUBCUTANEOUS | Status: DC
  Administered 2018-06-13: 40 [IU] via SUBCUTANEOUS
  Filled 2018-06-13 (×2): qty 0.4

## 2018-06-13 MED ORDER — FLUVOXAMINE MALEATE 50 MG PO TABS
100.0000 mg | ORAL_TABLET | Freq: Every day | ORAL | Status: DC
  Filled 2018-06-13: qty 2

## 2018-06-13 MED ORDER — HYDROCHLOROTHIAZIDE 25 MG PO TABS
25.0000 mg | ORAL_TABLET | Freq: Every day | ORAL | Status: DC
  Administered 2018-06-13: 25 mg via ORAL
  Filled 2018-06-13: qty 1

## 2018-06-13 MED ORDER — LITHIUM CARBONATE ER 300 MG PO TBCR
300.0000 mg | EXTENDED_RELEASE_TABLET | Freq: Two times a day (BID) | ORAL | Status: DC
  Administered 2018-06-13: 300 mg via ORAL
  Filled 2018-06-13 (×2): qty 1

## 2018-06-13 MED ORDER — VALACYCLOVIR HCL 500 MG PO TABS
1000.0000 mg | ORAL_TABLET | Freq: Every day | ORAL | Status: DC
  Administered 2018-06-13: 1000 mg via ORAL
  Filled 2018-06-13: qty 2

## 2018-06-13 NOTE — ED Notes (Signed)
Patient discharged home with [facility] team, patient received discharge papers. Patient received belongings and verbalized she has received all of her belongings. Patient appropriate and cooperative, Denies SI/HI AVH. Vital signs taken. NAD noted.

## 2018-06-13 NOTE — ED Notes (Signed)
Spoke with Beverlee Nims patients ACTT team staff member, Beverlee Nims stated that patient received her Abilify injection on Jun 03 2018. Beverlee Nims also stated she will assist patient in transitioning to another home.Diane will also help patient get a guardian.

## 2018-06-13 NOTE — ED Notes (Signed)
Gave lunch tray with juice.

## 2018-06-13 NOTE — ED Provider Notes (Signed)
-----------------------------------------   6:19 AM on 06/13/2018 -----------------------------------------   Blood pressure 126/64, pulse 88, temperature 98.3 F (36.8 C), temperature source Oral, resp. rate 18, height 5\' 7"  (1.702 m), weight 104.3 kg, SpO2 100 %.  The patient is calm and cooperative at this time.  There have been no acute events since the last update.  Awaiting disposition plan from Behavioral Medicine team.   Paulette Blanch, MD 06/13/18 [phone]

## 2018-06-13 NOTE — Discharge Instructions (Signed)
Follow-up with the act team as planned.  Continue your medications.  Return for any problems.
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
## 2018-06-13 NOTE — ED Notes (Signed)
Pt up and to the bathroom. Back to be. No co's voiced.

## 2018-06-13 NOTE — ED Notes (Signed)
Pt given breakfast tray

## 2018-06-13 NOTE — ED Notes (Signed)
Hourly rounding reveals patient sleeping in room. No complaints, stable, in no acute distress. Q15 minute rounds and monitoring via Security to continue. 

## 2018-06-13 NOTE — BH Assessment (Addendum)
TTS spoke to pt's ACTT staff member (Dianne: [phone]) who reports she is in route to pick-up pt for discharge - ETA: 15-70mins.  This information was relayed to pt's nurse.

## 2018-06-13 NOTE — Progress Notes (Signed)
Per Dr. Ilda Basset pt does not need to be contacted for missed appointment.

## 2018-06-14 ENCOUNTER — Encounter: Payer: Self-pay | Admitting: Obstetrics and Gynecology

## 2018-09-25 ENCOUNTER — Observation Stay (HOSPITAL_COMMUNITY)
Admission: AD | Admit: 2018-09-25 | Discharge: 2018-09-26 | Disposition: A | Discharge status: Home / Self Care | Payer: Medicaid Other | Attending: Psychiatry | Admitting: Psychiatry

## 2018-09-25 ENCOUNTER — Encounter (HOSPITAL_COMMUNITY): Payer: Self-pay | Admitting: *Deleted

## 2018-09-25 ENCOUNTER — Other Ambulatory Visit: Payer: Self-pay

## 2018-09-25 DIAGNOSIS — Z79899 Other long term (current) drug therapy: Secondary | ICD-10-CM | POA: Insufficient documentation

## 2018-09-25 DIAGNOSIS — E119 Type 2 diabetes mellitus without complications: Secondary | ICD-10-CM | POA: Diagnosis not present

## 2018-09-25 DIAGNOSIS — Z833 Family history of diabetes mellitus: Secondary | ICD-10-CM | POA: Insufficient documentation

## 2018-09-25 DIAGNOSIS — Z880 Allergy status to penicillin: Secondary | ICD-10-CM | POA: Diagnosis not present

## 2018-09-25 DIAGNOSIS — F250 Schizoaffective disorder, bipolar type: Principal | ICD-10-CM | POA: Insufficient documentation

## 2018-09-25 DIAGNOSIS — R45851 Suicidal ideations: Secondary | ICD-10-CM | POA: Insufficient documentation

## 2018-09-25 DIAGNOSIS — F17210 Nicotine dependence, cigarettes, uncomplicated: Secondary | ICD-10-CM | POA: Insufficient documentation

## 2018-09-25 DIAGNOSIS — Z8249 Family history of ischemic heart disease and other diseases of the circulatory system: Secondary | ICD-10-CM | POA: Insufficient documentation

## 2018-09-25 DIAGNOSIS — Z888 Allergy status to other drugs, medicaments and biological substances status: Secondary | ICD-10-CM | POA: Diagnosis not present

## 2018-09-25 DIAGNOSIS — Z794 Long term (current) use of insulin: Secondary | ICD-10-CM | POA: Insufficient documentation

## 2018-09-25 DIAGNOSIS — R45850 Homicidal ideations: Secondary | ICD-10-CM | POA: Diagnosis not present

## 2018-09-25 DIAGNOSIS — F411 Generalized anxiety disorder: Secondary | ICD-10-CM | POA: Diagnosis not present

## 2018-09-25 DIAGNOSIS — E669 Obesity, unspecified: Secondary | ICD-10-CM | POA: Insufficient documentation

## 2018-09-25 DIAGNOSIS — I10 Essential (primary) hypertension: Secondary | ICD-10-CM | POA: Diagnosis not present

## 2018-09-25 DIAGNOSIS — R4189 Other symptoms and signs involving cognitive functions and awareness: Secondary | ICD-10-CM | POA: Insufficient documentation

## 2018-09-25 DIAGNOSIS — F319 Bipolar disorder, unspecified: Secondary | ICD-10-CM | POA: Diagnosis not present

## 2018-09-25 LAB — URINALYSIS, ROUTINE W REFLEX MICROSCOPIC
Bilirubin Urine: NEGATIVE
Glucose, UA: NEGATIVE mg/dL
Hgb urine dipstick: NEGATIVE
Ketones, ur: NEGATIVE mg/dL
Leukocytes,Ua: NEGATIVE
Nitrite: NEGATIVE
Protein, ur: NEGATIVE mg/dL
Specific Gravity, Urine: 1.019 (ref 1.005–1.030)
pH: 5 (ref 5.0–8.0)

## 2018-09-25 LAB — RAPID URINE DRUG SCREEN, HOSP PERFORMED
Amphetamines: NOT DETECTED
Barbiturates: NOT DETECTED
Benzodiazepines: NOT DETECTED
Cocaine: NOT DETECTED
Opiates: NOT DETECTED
Tetrahydrocannabinol: NOT DETECTED

## 2018-09-25 LAB — PREGNANCY, URINE: Preg Test, Ur: NEGATIVE

## 2018-09-25 LAB — GLUCOSE, CAPILLARY: Glucose-Capillary: 81 mg/dL (ref 70–99)

## 2018-09-25 MED ORDER — FLUVOXAMINE MALEATE 100 MG PO TABS
100.0000 mg | ORAL_TABLET | Freq: Every day | ORAL | Status: DC
  Administered 2018-09-25: 100 mg via ORAL
  Filled 2018-09-25 (×3): qty 1

## 2018-09-25 MED ORDER — INSULIN DETEMIR 100 UNIT/ML ~~LOC~~ SOLN
40.0000 [IU] | Freq: Every day | SUBCUTANEOUS | Status: DC
  Administered 2018-09-25 – 2018-09-26 (×2): 40 [IU] via SUBCUTANEOUS

## 2018-09-25 MED ORDER — LITHIUM CARBONATE ER 300 MG PO TBCR
300.0000 mg | EXTENDED_RELEASE_TABLET | Freq: Two times a day (BID) | ORAL | Status: DC
  Administered 2018-09-25 – 2018-09-26 (×2): 300 mg via ORAL
  Filled 2018-09-25 (×3): qty 1

## 2018-09-25 MED ORDER — TRAZODONE HCL 50 MG PO TABS: 50.0000 mg | ORAL_TABLET | Freq: Every evening | ORAL | Status: DC | PRN

## 2018-09-25 MED ORDER — HYDROXYZINE HCL 25 MG PO TABS: 25.0000 mg | ORAL_TABLET | Freq: Three times a day (TID) | ORAL | Status: DC | PRN

## 2018-09-25 MED ORDER — ACETAMINOPHEN 325 MG PO TABS: 650.0000 mg | ORAL_TABLET | Freq: Four times a day (QID) | ORAL | Status: DC | PRN

## 2018-09-25 MED ORDER — ESCITALOPRAM OXALATE 10 MG PO TABS
10.0000 mg | ORAL_TABLET | Freq: Every day | ORAL | Status: DC
  Administered 2018-09-25 – 2018-09-26 (×2): 10 mg via ORAL
  Filled 2018-09-25 (×2): qty 1

## 2018-09-25 MED ORDER — ALUM & MAG HYDROXIDE-SIMETH 200-200-20 MG/5ML PO SUSP: 30.0000 mL | ORAL | Status: DC | PRN

## 2018-09-25 MED ORDER — MAGNESIUM HYDROXIDE 400 MG/5ML PO SUSP: 30.0000 mL | Freq: Every day | ORAL | Status: DC | PRN

## 2018-09-25 MED ORDER — HYDROCHLOROTHIAZIDE 25 MG PO TABS
25.0000 mg | ORAL_TABLET | Freq: Every day | ORAL | Status: DC
  Administered 2018-09-26: 25 mg via ORAL
  Filled 2018-09-25 (×2): qty 1

## 2018-09-25 NOTE — H&P (Addendum)
Behavioral Health Medical Screening Exam  [name] is an 28 y.o. female. Pt is well known to this hospital system. She presents voluntarily from her group home. She is angry with her boyfriend and stated she is suicidal and homicidal. She stated she would overdose. She had no plan for homicide. She is calm and cooperative but appears depressed. She stated she does not think her medicine is working. She will be given her home medications while on the OBS unit. She is asking for something for her depression. She stated she has been on Cymbalta before but it does not work. Will try a low dose of Lexapro for symptom management.   Total Time spent with patient: 30 minutes  Psychiatric Specialty Exam: Physical Exam  Constitutional: She is oriented to person, place, and time. She appears well-developed and well-nourished.  HENT:  Head: Normocephalic.  Respiratory: Effort normal.  Musculoskeletal: Normal range of motion.  Neurological: She is alert and oriented to person, place, and time.  Psychiatric: Her speech is normal and behavior is normal. Cognition and memory are impaired (Learning disability). She exhibits a depressed mood. She expresses homicidal and suicidal ideation.    Review of Systems  Psychiatric/Behavioral: Positive for depression and suicidal ideas.  All other systems reviewed and are negative.   Blood pressure (!) 131/103, pulse 86, temperature 98.2 F (36.8 C), resp. rate 16.There is no height or weight on file to calculate BMI.  General Appearance: Casual  Eye Contact:  Good  Speech:  Clear and Coherent and Normal Rate  Volume:  Normal  Mood:  Depressed  Affect:  Congruent and Depressed  Thought Process:  Coherent, Linear and Descriptions of Associations: Intact  Orientation:  Full (Time, Place, and Person)  Thought Content:  Illogical  Suicidal Thoughts:  Yes.  without intent/plan  Homicidal Thoughts:  Yes.  without intent/plan  Memory:  Immediate;    Fair Recent;   Fair Remote;   Fair  Judgement:  Poor  Insight:  Shallow  Psychomotor Activity:  Normal  Concentration: Concentration: Fair and Attention Span: Fair  Recall:  [employer] of Knowledge:Fair  Language: Good  Akathisia:  Negative  Handed:  Right  AIMS (if indicated):     Assets:  Agricultural consultant Housing Social Support  Sleep:       Musculoskeletal: Strength & Muscle Tone: within normal limits Gait & Station: normal Patient leans: N/A  Blood pressure (!) 131/103, pulse 86, temperature 98.2 F (36.8 C), resp. rate 16.  Recommendations:  Based on my evaluation the patient does not appear to have an emergency medical condition.  Pt to be observed overnight on the [facility] OBS unit for safety  [name], NP 09/25/2018, 1:05 PM

## 2018-09-25 NOTE — H&P (Addendum)
[facility] Provider Admission PAA/H&P  Patient Identification: [name] MRN:  [id number] Date of Evaluation:  09/25/2018 Chief Complaint:  MDD Principal Diagnosis: Bipolar 1 disorder ([place]) Diagnosis:  Principal Problem:   Bipolar 1 disorder ([place]) Active Problems:   Generalized anxiety disorder  History of Present Illness: [name] is an 27 y.o. female. Pt is well known to this hospital system. She presents voluntarily from her group home. Apparently she has only been at this group home for a few days. She told TTS that she requested to be discharged because she does not like it there. In the past, Doralene has lived by herself and would present to the emergency room stating she wanted a group home because she did not want to be alone. Today, she is angry with her boyfriend because he was talking to other women at the group home. She stated she is suicidal and homicidal toward him. She stated she would overdose and has attempted to overdose in the past. She had no plan for homicide. She has a history of  chronic suicidal and homicidal ideation, especially if she becomes upset with someone or her living situation. She is calm and cooperative but appears depressed. She stated she does not think her medicine is working. She will be given her home medications while on the OBS unit. She is asking for something for her depression. She stated she has been on Cymbalta before but it does not work. Will try a low dose of Lexapro for symptom management.   Associated Signs/Symptoms: Depression Symptoms:  depressed mood, anhedonia, impaired memory, suicidal thoughts without plan, anxiety, loss of energy/fatigue, (Hypo) Manic Symptoms:  Distractibility, Anxiety Symptoms:  Obsessive Compulsive Symptoms:   None,, Psychotic Symptoms:  N/A PTSD Symptoms: Negative Total Time spent with patient: 30 minutes  Past Psychiatric History: Pt has had previous inpatient psychiatric admissions  for mood stabilization  Is the patient at risk to self? No.  Has the patient been a risk to self in the past 6 months? Yes.    Has the patient been a risk to self within the distant past? Yes.    Is the patient a risk to others? No.  Has the patient been a risk to others in the past 6 months? No.  Has the patient been a risk to others within the distant past? No.   Prior Inpatient Therapy:   Prior Outpatient Therapy:    Alcohol Screening:   Substance Abuse History in the last 12 months:  No. Consequences of Substance Abuse: Negative Previous Psychotropic Medications: Yes  Psychological Evaluations: No  Past Medical History:  Past Medical History:  Diagnosis Date  . Anxiety   . Bipolar 1 disorder ([place])   . Cognitive deficits   . Depression   . Diabetes mellitus without complication ([place])   . Hypertension   . Mental disorder   . Mental health disorder   . Obesity     Past Surgical History:  Procedure Laterality Date  . CESAREAN SECTION    . CESAREAN SECTION N/A 04/25/2013   Procedure: REPEAT CESAREAN SECTION;  Surgeon: [name], MD;  Location: [facility];  Service: Obstetrics;  Laterality: N/A;  . MASS EXCISION N/A 06/03/2012   Procedure: EXCISION MASS;  Surgeon: [name], MD;  Location: [facility];  Service: ENT;  Laterality: N/A;  Excision uvula mass  . TONSILLECTOMY N/A 06/03/2012   Procedure: TONSILLECTOMY;  Surgeon: [name], MD;  Location: [facility];  Service:  ENT;  Laterality: N/A;  . TONSILLECTOMY     Family History:  Family History  Problem Relation Age of Onset  . Hypertension Mother   . Diabetes Father    Family Psychiatric History: Pt did not give this information Tobacco Screening:   Social History:  Social History   Substance and Sexual Activity  Alcohol Use Never  . Frequency: Never     Social History   Substance and Sexual Activity  Drug Use Never   Comment: Patient denies    Additional Social  History:      Pain Medications: See MAR Prescriptions: See MAR Over the Counter: See MAR History of alcohol / drug use?: No history of alcohol / drug abuse Longest period of sobriety (when/how long): NA                    Allergies:   Allergies  Allergen Reactions  . Wellbutrin [Bupropion] Shortness Of Breath  . Omnipaque [Iohexol] Swelling and Other (See Comments)    Reaction:  Eye swelling  . Penicillins Hives and Other (See Comments)    Has patient had a PCN reaction causing immediate rash, facial/tongue/throat swelling, SOB or lightheadedness with hypotension: No Has patient had a PCN reaction causing severe rash involving mucus membranes or skin necrosis: No Has patient had a PCN reaction that required hospitalization No Has patient had a PCN reaction occurring within the last 10 years: No If all of the above answers are "NO", then may proceed with Cephalosporin use.  Herma Mering [Benztropine]     Make pt feel crazy  . Depakote Er [Divalproex Sodium Er] Nausea And Vomiting  . Divalproex Sodium   . Iohexol   . Penicillins    Lab Results: No results found for this or any previous visit (from the past 48 hour(s)).  Blood Alcohol level:  Lab Results  Component Value Date   ETH <10 06/12/2018   ETH <10 42/70/6237    Metabolic Disorder Labs:  Lab Results  Component Value Date   HGBA1C 6.9 (H) 05/04/2018   MPG 151.33 05/04/2018   MPG 214.47 12/01/2017   Lab Results  Component Value Date   PROLACTIN 36.7 (H) 12/01/2017   PROLACTIN 37.3 (H) 07/18/2015   Lab Results  Component Value Date   CHOL 169 05/04/2018   TRIG 163 (H) 05/04/2018   HDL 42 05/04/2018   CHOLHDL 4.0 05/04/2018   VLDL 33 05/04/2018   LDLCALC 94 05/04/2018   LDLCALC 147 (H) 12/01/2017    Current Medications: Current Facility-Administered Medications  Medication Dose Route Frequency Provider Last Rate Last Dose  . escitalopram (LEXAPRO) tablet 10 mg  10 mg Oral Daily [name], NP      . fluvoxaMINE (LUVOX) tablet 100 mg  100 mg Oral QHS [name], NP      . hydrochlorothiazide (HYDRODIURIL) tablet 25 mg  25 mg Oral Daily [name], NP      . insulin detemir (LEVEMIR) injection 40 Units  40 Units Subcutaneous Daily [name], NP      . lithium carbonate (LITHOBID) CR tablet 300 mg  300 mg Oral Q12H [name], NP       PTA Medications: Medications Prior to Admission  Medication Sig Dispense Refill Last Dose  . ABILIFY MAINTENA 400 MG PRSY prefilled syringe Inject 400 mg into the muscle every 28 (twenty-eight) days. Next dose on 05/30/2018 1 each 6 Taking  . ARIPiprazole ER (ABILIFY MAINTENA) 400  MG PRSY prefilled syringe Inject 400 mg into the muscle every 28 (twenty-eight) days.   unknown at unknown  . fluvoxaMINE (LUVOX) 100 MG tablet Take 1 tablet (100 mg total) by mouth at bedtime. 60 tablet 1 Taking  . fluvoxaMINE (LUVOX) 100 MG tablet Take 100 mg by mouth at bedtime.   unknown at unknown  . hydrochlorothiazide (HYDRODIURIL) 25 MG tablet Take 25 mg by mouth daily.   Taking  . hydrochlorothiazide (HYDRODIURIL) 25 MG tablet Take 25 mg by mouth daily.   unknwon at unknown  . insulin detemir (LEVEMIR) 100 UNIT/ML injection Inject 40 Units into the skin 2 (two) times daily.   Taking  . insulin detemir (LEVEMIR) 100 UNIT/ML injection Inject 40 Units into the skin daily.   unknown at unknown  . lithium carbonate (LITHOBID) 300 MG CR tablet Take 300 mg by mouth 2 (two) times daily.   6 Taking  . lithium carbonate (LITHOBID) 300 MG CR tablet Take 300 mg by mouth 2 (two) times daily.   unknown at unknown  . valACYclovir (VALTREX) 1000 MG tablet Take 1 tablet (1,000 mg total) by mouth daily. 20 tablet 1   . valACYclovir (VALTREX) 1000 MG tablet Take 1,000 mg by mouth daily.   unknown at unknown    Musculoskeletal: Strength & Muscle Tone: within normal limits Gait & Station: normal Patient leans: N/A   Total Time  spent with patient: 30 minutes  Psychiatric Specialty Exam: Physical Exam  Constitutional: She is oriented to person, place, and time. She appears well-developed and well-nourished.  HENT:  Head: Normocephalic.  Respiratory: Effort normal.  Musculoskeletal: Normal range of motion.  Neurological: She is alert and oriented to person, place, and time.  Psychiatric: Her speech is normal and behavior is normal. Cognition and memory are impaired (Learning disability). She exhibits a depressed mood. She expresses homicidal and suicidal ideation, chronic and situational in nature.    Review of Systems  Psychiatric/Behavioral: Positive for depression and suicidal ideas.  All other systems reviewed and are negative.   There were no vitals taken for this visit.There is no height or weight on file to calculate BMI.  General Appearance: Casual  Eye Contact:  Good  Speech:  Clear and Coherent and Normal Rate  Volume:  Normal  Mood:  Depressed  Affect:  Congruent and Depressed  Thought Process:  Coherent, Linear and Descriptions of Associations: Intact  Orientation:  Full (Time, Place, and Person)  Thought Content:  Illogical  Suicidal Thoughts:  Yes.  without intent/plan  Homicidal Thoughts:  Yes.  without intent/plan  Memory:  Immediate;   Fair Recent;   Fair Remote;   Fair  Judgement:  Poor  Insight:  Shallow  Psychomotor Activity:  Normal  Concentration: Concentration: Fair and Attention Span: Fair  Recall:  [employer] of Knowledge:Fair  Language: Good  Akathisia:  Negative  Handed:  Right  AIMS (if indicated):     Assets:  Agricultural consultant Housing Social Support  Sleep:           Treatment Plan Summary: Daily contact with patient to assess and evaluate symptoms and progress in treatment and Medication management (See MAR)  Observation Level/Precautions:  15 minute checks Laboratory:  CBC Chemistry Profile HbAIC UDS UA Urine  pregnancy, TSH, Lithium level Psychotherapy:  TBD Medications:  See MAR Consultations:  N/A Discharge Concerns:  Safety and medication management Estimated LOS:24-48 hours Other:  N/A    [name], NP 5/31/20201:19 PM

## 2018-09-25 NOTE — Progress Notes (Signed)
Pt was asleep in bed when approached for lab this evening. Pt declined lab draw on three separate occassions. Per pt "I'm too tired, I'm sleeping now, I will do it in the morning". Safety maintained on unit without incident to note.

## 2018-09-25 NOTE — Progress Notes (Signed)
[place] [place] NOVEL CORONAVIRUS (COVID-19) DAILY CHECK-OFF SYMPTOMS - answer yes or no to each - every day NO YES  Have you had a fever in the past 24 hours?  . Fever (Temp > 37.80C / 100F) X   Have you had any of these symptoms in the past 24 hours? . New Cough .  Sore Throat  .  Shortness of Breath .  Difficulty Breathing .  Unexplained Body Aches   X   Have you had any one of these symptoms in the past 24 hours not related to allergies?   . Runny Nose .  Nasal Congestion .  Sneezing   X   If you have had runny nose, nasal congestion, sneezing in the past 24 hours, has it worsened?  X   EXPOSURES - check yes or no X   Have you traveled outside the state in the past 14 days?  X   Have you been in contact with someone with a confirmed diagnosis of COVID-19 or PUI in the past 14 days without wearing appropriate PPE?  X   Have you been living in the same home as a person with confirmed diagnosis of COVID-19 or a PUI (household contact)?    X   Have you been diagnosed with COVID-19?    X              What to do next: Answered NO to all: Answered YES to anything:   Proceed with unit schedule Follow the [facility] Inpatient Flowsheet.   

## 2018-09-25 NOTE — Progress Notes (Signed)
Admission DAR Note: Pt presents to [facility] Observation unit under voluntary status accompanied by [employer]. A & O X 3. Observed to be animated but anxious [place]. Denies pain and AVH when assessed. Endorsed SI with plan to overdose and HI towards "my boyfriend, I think he's cheating on me, he went to the mailbox for four hours and he did not come back yet". Verbally contracts for safety. Emotional support offered. Pt encouraged to voice concerns and comply with current treatment regimen. Cooperative with skin assessment and routine search. All belongings placed in pt belonging bag and secure in locker. Q 15 minutes safety checks initiated without self harm gestures or outburst to note thus far.

## 2018-09-25 NOTE — BH Assessment (Addendum)
Assessment Note  [name] is an 28 y.o. female.  The pt came in with SI and HI.  She stated she is having thoughts to kill herself by overdosing.  The pt stated she made an attempt 2-3 years ago.  She is stressed, because she thinks her boyfriend is cheating on her.  The pt is also homicidal towards her boyfriend.  She denies having a plan to kill him.  She is currently seeing a psychiatrist "Margarita Grizzle".  She denies seeing a Social worker.  She was last inpatient at [facility] 05/2018.   The pt lives in a hotel room with her currently boyfriend. The pt was living in a group home and discharged herself 2 days ago, because she thought a boyfriend at the group home was cheating on her.  The pt denies self harm.  She denies legal issues and a history of abuse.  She stated she has a history of hearing whispers, but isn't hearing voices now.  She reports she is sleeping and eating well.  She stated she feels hopeless, increased crying spells and feels bad about herself.  She denies SA.  Pt is dressed in casual clothes. She is alert and oriented x4. Pt speaks in a clear tone, at moderate volume and normal pace. Eye contact is good. Pt's mood is depressed. Thought process is coherent and relevant. There is no indication Pt is currently responding to internal stimuli or experiencing delusional thought content.?Pt was cooperative throughout assessment.     Diagnosis: F33.2 Major depressive disorder, Recurrent episode, Severe  Past Medical History:  Past Medical History:  Diagnosis Date  . Anxiety   . Bipolar 1 disorder ([place])   . Cognitive deficits   . Depression   . Diabetes mellitus without complication ([place])   . Hypertension   . Mental disorder   . Mental health disorder   . Obesity     Past Surgical History:  Procedure Laterality Date  . CESAREAN SECTION    . CESAREAN SECTION N/A 04/25/2013   Procedure: REPEAT CESAREAN SECTION;  Surgeon: [name], MD;  Location: [facility];  Service: Obstetrics;   Laterality: N/A;  . MASS EXCISION N/A 06/03/2012   Procedure: EXCISION MASS;  Surgeon: [name], MD;  Location: [facility];  Service: ENT;  Laterality: N/A;  Excision uvula mass  . TONSILLECTOMY N/A 06/03/2012   Procedure: TONSILLECTOMY;  Surgeon: [name], MD;  Location: [facility];  Service: ENT;  Laterality: N/A;  . TONSILLECTOMY      Family History:  Family History  Problem Relation Age of Onset  . Hypertension Mother   . Diabetes Father     Social History:  reports that she has been smoking cigarettes. She has a 22.00 pack-year smoking history. She has never used smokeless tobacco. She reports that she does not drink alcohol or use drugs.  Additional Social History:  Alcohol / Drug Use Pain Medications: See MAR Prescriptions: See MAR Over the Counter: See MAR History of alcohol / drug use?: No history of alcohol / drug abuse Longest period of sobriety (when/how long): NA  CIWA:   COWS:    Allergies:  Allergies  Allergen Reactions  . Wellbutrin [Bupropion] Shortness Of Breath  . Omnipaque [Iohexol] Swelling and Other (See Comments)    Reaction:  Eye swelling  . Penicillins Hives and Other (See Comments)    Has patient had a PCN reaction causing immediate rash, facial/tongue/throat swelling, SOB or lightheadedness with hypotension: No Has patient had  a PCN reaction causing severe rash involving mucus membranes or skin necrosis: No Has patient had a PCN reaction that required hospitalization No Has patient had a PCN reaction occurring within the last 10 years: No If all of the above answers are "NO", then may proceed with Cephalosporin use.  Herma Mering [Benztropine]     Make pt feel crazy  . Depakote Er [Divalproex Sodium Er] Nausea And Vomiting  . Divalproex Sodium   . Iohexol   . Penicillins     Home Medications:  Medications Prior to Admission  Medication Sig Dispense Refill  . ABILIFY MAINTENA 400 MG PRSY prefilled  syringe Inject 400 mg into the muscle every 28 (twenty-eight) days. Next dose on 05/30/2018 1 each 6  . ARIPiprazole ER (ABILIFY MAINTENA) 400 MG PRSY prefilled syringe Inject 400 mg into the muscle every 28 (twenty-eight) days.    . fluvoxaMINE (LUVOX) 100 MG tablet Take 1 tablet (100 mg total) by mouth at bedtime. 60 tablet 1  . fluvoxaMINE (LUVOX) 100 MG tablet Take 100 mg by mouth at bedtime.    . hydrochlorothiazide (HYDRODIURIL) 25 MG tablet Take 25 mg by mouth daily.    . hydrochlorothiazide (HYDRODIURIL) 25 MG tablet Take 25 mg by mouth daily.    . insulin detemir (LEVEMIR) 100 UNIT/ML injection Inject 40 Units into the skin 2 (two) times daily.    . insulin detemir (LEVEMIR) 100 UNIT/ML injection Inject 40 Units into the skin daily.    Marland Kitchen lithium carbonate (LITHOBID) 300 MG CR tablet Take 300 mg by mouth 2 (two) times daily.   6  . lithium carbonate (LITHOBID) 300 MG CR tablet Take 300 mg by mouth 2 (two) times daily.    . valACYclovir (VALTREX) 1000 MG tablet Take 1 tablet (1,000 mg total) by mouth daily. 20 tablet 1  . valACYclovir (VALTREX) 1000 MG tablet Take 1,000 mg by mouth daily.      OB/GYN Status:  No LMP recorded.  General Assessment Data Location of Assessment: [facility] Assessment Services TTS Assessment: In system Is this a Tele or Face-to-Face Assessment?: Face-to-Face Is this an Initial Assessment or a Re-assessment for this encounter?: Initial Assessment Patient Accompanied by:: N/A Language Other than English: No Living Arrangements: Other (Comment) What gender do you identify as?: Female Marital status: Single Maiden name: Schuitema Pregnancy Status: No Living Arrangements: Other (Comment)(hotel) Can pt return to current living arrangement?: Yes Admission Status: Voluntary Is patient capable of signing voluntary admission?: Yes Referral Source: Self/Family/Friend Insurance type: Medicaid     Crisis Care Plan Living Arrangements: Other (Comment)(hotel) Legal  Guardian: Other:(Self) Name of Psychiatrist: "Margarita Grizzle" Name of Therapist: none  Education Status Is patient currently in school?: No Is the patient employed, unemployed or receiving disability?: Receiving disability income  Risk to self with the past 6 months Suicidal Ideation: Yes-Currently Present Has patient been a risk to self within the past 6 months prior to admission? : Yes Suicidal Intent: Yes-Currently Present Has patient had any suicidal intent within the past 6 months prior to admission? : Yes Is patient at risk for suicide?: Yes Suicidal Plan?: Yes-Currently Present Has patient had any suicidal plan within the past 6 months prior to admission? : Yes Specify Current Suicidal Plan: overdose on medication Access to Means: Yes Specify Access to Suicidal Means: has medicine What has been your use of drugs/alcohol within the last 12 months?: none Previous Attempts/Gestures: Yes How many times?: 1 Other Self Harm Risks: none Triggers for Past Attempts: Unpredictable Intentional Self  Injurious Behavior: None Family Suicide History: Unknown Recent stressful life event(s): Conflict (Comment)(thinks boyfriend is cheating on her) Persecutory voices/beliefs?: No Depression: Yes Depression Symptoms: Despondent, Insomnia, Tearfulness, Loss of interest in usual pleasures, Feeling worthless/self pity Substance abuse history and/or treatment for substance abuse?: No Suicide prevention information given to non-admitted patients: Not applicable  Risk to Others within the past 6 months Homicidal Ideation: Yes-Currently Present Does patient have any lifetime risk of violence toward others beyond the six months prior to admission? : No Thoughts of Harm to Others: Yes-Currently Present Comment - Thoughts of Harm to Others: wants to kill her boyfriend Current Homicidal Intent: No Current Homicidal Plan: No Access to Homicidal Means: No Identified Victim: pt denies History of harm to  others?: No Assessment of Violence: None Noted Violent Behavior Description: none Does patient have access to weapons?: No Criminal Charges Pending?: No Does patient have a court date: No Is patient on probation?: No  Psychosis Hallucinations: None noted Delusions: None noted  Mental Status Report Appearance/Hygiene: Unremarkable Eye Contact: Good Motor Activity: Freedom of movement, Unremarkable Speech: Logical/coherent Level of Consciousness: Alert Mood: Depressed Affect: Depressed Anxiety Level: None Thought Processes: Coherent, Relevant Judgement: Impaired Orientation: Person, Place, Time, Situation Obsessive Compulsive Thoughts/Behaviors: None  Cognitive Functioning Concentration: Normal Memory: Recent Intact, Remote Intact Is patient IDD: No(unknown) Insight: Poor Impulse Control: Poor Appetite: Good Have you had any weight changes? : No Change Sleep: Decreased Total Hours of Sleep: 6 Vegetative Symptoms: None  ADLScreening [facility] Assessment Services) Patient's cognitive ability adequate to safely complete daily activities?: Yes Patient able to express need for assistance with ADLs?: Yes Independently performs ADLs?: Yes (appropriate for developmental age)  Prior Inpatient Therapy Prior Inpatient Therapy: Yes Prior Therapy Dates: 05/2018 Prior Therapy Facilty/Provider(s): [facility] Reason for Treatment: depression  Prior Outpatient Therapy Prior Outpatient Therapy: Yes Prior Therapy Dates: current Prior Therapy Facilty/Provider(s): "Margarita Grizzle" Reason for Treatment: depression Does patient have an ACCT team?: No Does patient have Intensive In-House Services?  : No Does patient have [facility] services? : No Does patient have P4CC services?: No  ADL Screening (condition at time of admission) Patient's cognitive ability adequate to safely complete daily activities?: Yes Patient able to express need for assistance with ADLs?: Yes Independently performs ADLs?: Yes  (appropriate for developmental age)       Abuse/Neglect Assessment (Assessment to be complete while patient is alone) Abuse/Neglect Assessment Can Be Completed: Yes Physical Abuse: Denies Verbal Abuse: Denies Sexual Abuse: Denies Exploitation of patient/patient's resources: Denies Self-Neglect: Denies Values / Beliefs Cultural Requests During Hospitalization: None Spiritual Requests During Hospitalization: None Consults Spiritual Care Consult Needed: No Social Work Consult Needed: No            Disposition:  Disposition Initial Assessment Completed for this Encounter: Yes Disposition of Patient: (observe)   NP Jinny Blossom recommends the pt be observed overnight for safety and stabilization.  The pt is to be reassessed by psychiatry in the morning.  On Site Evaluation by:   Reviewed with Physician:    Enzo Montgomery 09/25/2018 1:31 PM

## 2018-09-26 DIAGNOSIS — F250 Schizoaffective disorder, bipolar type: Secondary | ICD-10-CM | POA: Diagnosis not present

## 2018-09-26 DIAGNOSIS — F319 Bipolar disorder, unspecified: Secondary | ICD-10-CM | POA: Diagnosis not present

## 2018-09-26 LAB — COMPREHENSIVE METABOLIC PANEL
ALT: 16 U/L (ref 0–44)
AST: 18 U/L (ref 15–41)
Albumin: 3.8 g/dL (ref 3.5–5.0)
Alkaline Phosphatase: 28 U/L — ABNORMAL LOW (ref 38–126)
Anion gap: 6 (ref 5–15)
BUN: 15 mg/dL (ref 6–20)
CO2: 27 mmol/L (ref 22–32)
Calcium: 8.6 mg/dL — ABNORMAL LOW (ref 8.9–10.3)
Chloride: 105 mmol/L (ref 98–111)
Creatinine, Ser: 0.8 mg/dL (ref 0.44–1.00)
GFR calc Af Amer: >60 mL/min (ref >60)
GFR calc non Af Amer: >60 mL/min (ref >60)
Glucose, Bld: 98 mg/dL (ref 70–99)
Potassium: 3.2 mmol/L — ABNORMAL LOW (ref 3.5–5.1)
Sodium: 138 mmol/L (ref 135–145)
Total Bilirubin: 0.8 mg/dL (ref 0.3–1.2)
Total Protein: 6.9 g/dL (ref 6.5–8.1)

## 2018-09-26 LAB — CBC
HCT: 37.7 % (ref 36.0–46.0)
Hemoglobin: 11.5 g/dL — ABNORMAL LOW (ref 12.0–15.0)
MCH: 26.7 pg (ref 26.0–34.0)
MCHC: 30.5 g/dL (ref 30.0–36.0)
MCV: 87.7 fL (ref 80.0–100.0)
Platelets: 240 10*3/uL (ref 150–400)
RBC: 4.3 MIL/uL (ref 3.87–5.11)
RDW: 14.5 % (ref 11.5–15.5)
WBC: 9.2 10*3/uL (ref 4.0–10.5)
nRBC: 0 % (ref 0.0–0.2)

## 2018-09-26 LAB — HEMOGLOBIN A1C
Hgb A1c MFr Bld: 5.5 % (ref 4.8–5.6)
Mean Plasma Glucose: 111.15 mg/dL

## 2018-09-26 LAB — LITHIUM LEVEL: Lithium Lvl: 0.41 mmol/L — ABNORMAL LOW (ref 0.60–1.20)

## 2018-09-26 LAB — TSH: TSH: 0.814 u[IU]/mL (ref 0.350–4.500)

## 2018-09-26 MED ORDER — FLUVOXAMINE MALEATE 100 MG PO TABS: 100.0000 mg | ORAL_TABLET | Freq: Every day | ORAL | 0 refills | Status: DC

## 2018-09-26 NOTE — Discharge Summary (Addendum)
Physician Discharge Summary Note  Patient:  [name] is an 28 y.o., female MRN:  [id number] DOB:  May 06, 1990 Patient phone:  [phone] (home)  Patient address:   [address] [place]  [address]  Total Time spent with patient: 45 minutes  Date of Admission:  09/25/2018 Date of Discharge: 09/26/2018  Reason for Admission:  Suicide threat  Principal Problem: Bipolar 1 disorder [facility]) Discharge Diagnoses: Principal Problem:   Bipolar 1 disorder ([place]) Active Problems:   Generalized anxiety disorder   Schizoaffective disorder, bipolar type ([place])  Past Psychiatric History: schizoaffective disorder, bipolar type  Past Medical History:  Past Medical History:  Diagnosis Date  . Anxiety   . Bipolar 1 disorder ([place])   . Cognitive deficits   . Depression   . Diabetes mellitus without complication ([place])   . Hypertension   . Mental disorder   . Mental health disorder   . Obesity     Past Surgical History:  Procedure Laterality Date  . CESAREAN SECTION    . CESAREAN SECTION N/A 04/25/2013   Procedure: REPEAT CESAREAN SECTION;  Surgeon: [name], MD;  Location: [facility];  Service: Obstetrics;  Laterality: N/A;  . MASS EXCISION N/A 06/03/2012   Procedure: EXCISION MASS;  Surgeon: [name], MD;  Location: [facility];  Service: ENT;  Laterality: N/A;  Excision uvula mass  . TONSILLECTOMY N/A 06/03/2012   Procedure: TONSILLECTOMY;  Surgeon: [name], MD;  Location: [facility];  Service: ENT;  Laterality: N/A;  . TONSILLECTOMY     Family History:  Family History  Problem Relation Age of Onset  . Hypertension Mother   . Diabetes Father    Family Psychiatric  History: none Social History:  Social History   Substance and Sexual Activity  Alcohol Use Never  . Frequency: Never     Social History   Substance and Sexual Activity  Drug Use Never   Comment: Patient denies    Social History   Socioeconomic History  .  Marital status: Single    Spouse name: Not on file  . Number of children: Not on file  . Years of education: Not on file  . Highest education level: Not on file  Occupational History  . Not on file  Social Needs  . Financial resource strain: Not on file  . Food insecurity:    Worry: Not on file    Inability: Not on file  . Transportation needs:    Medical: Not on file    Non-medical: Not on file  Tobacco Use  . Smoking status: Current Every Day Smoker    Packs/day: 2.00    Years: 11.00    Pack years: 22.00    Types: Cigarettes  . Smokeless tobacco: Never Used  Substance and Sexual Activity  . Alcohol use: Never    Frequency: Never  . Drug use: Never    Comment: Patient denies  . Sexual activity: Yes    Birth control/protection: Implant  Lifestyle  . Physical activity:    Days per week: Not on file    Minutes per session: Not on file  . Stress: Not on file  Relationships  . Social connections:    Talks on phone: Not on file    Gets together: Not on file    Attends religious service: Not on file    Active member of club or organization: Not on file    Attends meetings of clubs or organizations: Not on file  Relationship status: Not on file  Other Topics Concern  . Not on file  Social History Narrative   ** Merged History [facility] Course:  On admission:  [name] is an 28 y.o. female. Pt is well known to this hospital system. She presents voluntarily from her group home. Apparently she has only been at this group home for a few days. She told TTS that she requested to be discharged because she does not like it there. In the past, [name] has lived by herself and would present to the emergency room stating she wanted a group home because she did not want to be alone. Today, she is angry with her boyfriend because he was talking to other women at the group home. She stated she is suicidal and homicidal toward him. She stated she would overdose  and has attempted to overdose in the past. She had no plan for homicide. She has a history of  chronic suicidal and homicidal ideation, especially if she becomes upset with someone or her living situation. She is calm and cooperative but appears depressed. She stated she does not think her medicine is working. She will be given her home medications while on the OBS unit. She is asking for something for her depression. She stated she has been on Cymbalta before but it does not work. Will try a low dose of Lexapro for symptom management.   Medications:  HCTZ 25 mg daily, hydroxyzine 25 mg TID PRN, Lithium 300 mg BID, Levemir 40 units IM daily, Trazodone 50 mg at bedtime PRN sleep, Luvox 150 mg daily   09/26/18: Patient has met maximum benefit of hospitalization.  Denies suicidal/homicidal ideations, hallucinations, and substance abuse.  Discharge instructions provided along with crisis numbers, Rx, and encouragement to follow up with her ACT team.  Physical Findings: AIMS: Facial and Oral Movements Muscles of Facial Expression: None, normal Lips and Perioral Area: None, normal Jaw: None, normal Tongue: None, normal,Extremity Movements Upper (arms, wrists, hands, fingers): None, normal Lower (legs, knees, ankles, toes): None, normal, Trunk Movements Neck, shoulders, hips: None, normal, Overall Severity Severity of abnormal movements (highest score from questions above): None, normal Incapacitation due to abnormal movements: None, normal Patient's awareness of abnormal movements (rate only patient's report): No Awareness, Dental Status Current problems with teeth and/or dentures?: No Does patient usually wear dentures?: No  CIWA:   N/A COWS:   N/A  Musculoskeletal: Strength & Muscle Tone: within normal limits Gait & Station: normal Patient leans: N/A  Psychiatric Specialty Exam: Physical Exam  Nursing note and vitals reviewed. Constitutional: She is oriented to person, place, and time. She  appears well-developed and well-nourished.  HENT:  Head: Normocephalic.  Neck: Normal range of motion.  Respiratory: Effort normal.  Musculoskeletal: Normal range of motion.  Neurological: She is alert and oriented to person, place, and time.  Psychiatric: Her speech is normal and behavior is normal. Judgment and thought content normal. Her affect is blunt. Cognition and memory are normal. She exhibits a depressed mood.    Review of Systems  Psychiatric/Behavioral: Positive for depression.  All other systems reviewed and are negative.   Blood pressure 110/74, pulse 95, temperature 98.1 F (36.7 C), temperature source Oral, resp. rate 17, SpO2 100 %.There is no height or weight on file to calculate BMI.  General Appearance: Casual  Eye Contact:  Good  Speech:  Normal Rate  Volume:  Normal  Mood:  Depressed, mild  Affect:  Flat  Thought Process:  Coherent and Descriptions of Associations: Intact  Orientation:  Full (Time, Place, and Person)  Thought Content:  WDL and Logical  Suicidal Thoughts:  No  Homicidal Thoughts:  No  Memory:  Immediate;   Good Recent;   Good Remote;   Good  Judgement:  Fair  Insight:  Fair  Psychomotor Activity:  Normal  Concentration:  Concentration: Good and Attention Span: Good  Recall:  Good  Fund of Knowledge:  Good  Language:  Good  Akathisia:  No  Handed:  Right  AIMS (if indicated):   N/A  Assets:  Leisure Time Physical Health Resilience Social Support  ADL's:  Intact  Cognition:  WNL  Sleep:   N/A        Has this patient used any form of tobacco in the last 30 days? (Cigarettes, Smokeless Tobacco, Cigars, and/or Pipes) NA  Blood Alcohol level:  Lab Results  Component Value Date   ETH <10 06/12/2018   ETH <10 28/02/8866    Metabolic Disorder Labs:  Lab Results  Component Value Date   HGBA1C 5.5 09/26/2018   MPG 111.15 09/26/2018   MPG 151.33 05/04/2018   Lab Results  Component Value Date   PROLACTIN 36.7 (H) 12/01/2017    PROLACTIN 37.3 (H) 07/18/2015   Lab Results  Component Value Date   CHOL 169 05/04/2018   TRIG 163 (H) 05/04/2018   HDL 42 05/04/2018   CHOLHDL 4.0 05/04/2018   VLDL 33 05/04/2018   LDLCALC 94 05/04/2018   LDLCALC 147 (H) 12/01/2017    See Psychiatric Specialty Exam and Suicide Risk Assessment completed by Attending Physician prior to discharge.  Discharge destination:  Home  Is patient on multiple antipsychotic therapies at discharge:  No   Has Patient had three or more failed trials of antipsychotic monotherapy by history:  No  Recommended Plan for Multiple Antipsychotic Therapies: NA     Follow-up recommendations:  Activity:  as tolerated Diet:  heart healthy diet  Comments:  Follow up with your ACT team, [facility]  Signed: Waylan Boga, NP 09/26/2018, 11:06 AM   Patient seen face-to-face for psychiatric evaluation, chart reviewed and case discussed with the physician extender and developed treatment plan. Reviewed the information documented and agree with the treatment plan.  Buford Dresser, DO 09/26/18 3:53 PM

## 2018-09-26 NOTE — [facility] Suicide Risk Assessment (Signed)
Suicide Risk Assessment  Discharge Assessment   [facility] Discharge Suicide Risk Assessment   Principal Problem: Bipolar 1 disorder [facility]) Discharge Diagnoses: Principal Problem:   Bipolar 1 disorder ([place]) Active Problems:   Generalized anxiety disorder   Schizoaffective disorder, bipolar type ([place])   Total Time spent with patient: 45 minutes  Musculoskeletal: Strength & Muscle Tone: within normal limits Gait & Station: normal Patient leans: N/A  Psychiatric Specialty Exam: Physical Exam  Nursing note and vitals reviewed. Constitutional: She is oriented to person, place, and time. She appears well-developed and well-nourished.  HENT:  Head: Normocephalic.  Neck: Normal range of motion.  Respiratory: Effort normal.  Musculoskeletal: Normal range of motion.  Neurological: She is alert and oriented to person, place, and time.  Psychiatric: Her speech is normal and behavior is normal. Judgment and thought content normal. Her affect is blunt. Cognition and memory are normal. She exhibits a depressed mood.    Review of Systems  Psychiatric/Behavioral: Positive for depression.  All other systems reviewed and are negative.   Blood pressure 110/74, pulse 95, temperature 98.1 F (36.7 C), temperature source Oral, resp. rate 17, SpO2 100 %.There is no height or weight on file to calculate BMI.  General Appearance: Casual  Eye Contact:  Good  Speech:  Normal Rate  Volume:  Normal  Mood:  Depressed, mild  Affect:  Flat  Thought Process:  Coherent and Descriptions of Associations: Intact  Orientation:  Full (Time, Place, and Person)  Thought Content:  WDL and Logical  Suicidal Thoughts:  No  Homicidal Thoughts:  No  Memory:  Immediate;   Good Recent;   Good Remote;   Good  Judgement:  Fair  Insight:  Fair  Psychomotor Activity:  Normal  Concentration:  Concentration: Good and Attention Span: Good  Recall:  Good  Fund of Knowledge:  Good  Language:  Good  Akathisia:  No  Handed:   Right  AIMS (if indicated):     Assets:  Leisure Time Physical Health Resilience Social Support  ADL's:  Intact  Cognition:  WNL  Sleep:       Mental Status Per Nursing Assessment::   On Admission:   suicidal ideations with a plan to overdose after getting upset with her boyfriend  Demographic Factors:  Adolescent or young adult  Loss Factors: NA  Historical Factors: NA  Risk Reduction Factors:   Living with another person, especially a relative, Positive social support and Positive therapeutic relationship  Continued Clinical Symptoms:  Depression, mild  Cognitive Features That Contribute To Risk:  None    Suicide Risk:  Minimal: No identifiable suicidal ideation.  Patients presenting with no risk factors but with morbid ruminations; may be classified as minimal risk based on the severity of the depressive symptoms   Plan Of Care/Follow-up recommendations:  Activity:  as tolerated Diet:  heart healthy diet  Waylan Boga, NP 09/26/2018, 1:30 PM

## 2018-09-26 NOTE — Progress Notes (Signed)
Patient was discharged per order. AVS and script were all reviewed with patient. Pt was given an opportunity to ask questions and verbalized understanding of all discharge paperwork. Belongings were returned, and patient signed for receipt. Patient verbalized readiness for discharge and appeared in no acute distress when escorted to lobby.

## 2018-09-26 NOTE — Progress Notes (Signed)
[place] NOVEL CORONAVIRUS (COVID-19) DAILY CHECK-OFF SYMPTOMS - answer yes or no to each - every day NO YES  Have you had a fever in the past 24 hours?  . Fever (Temp > 37.80C / 100F) X   Have you had any of these symptoms in the past 24 hours? . New Cough .  Sore Throat  .  Shortness of Breath .  Difficulty Breathing .  Unexplained Body Aches   X   Have you had any one of these symptoms in the past 24 hours not related to allergies?   . Runny Nose .  Nasal Congestion .  Sneezing   X   If you have had runny nose, nasal congestion, sneezing in the past 24 hours, has it worsened?  X   EXPOSURES - check yes or no X   Have you traveled outside the state in the past 14 days?  X   Have you been in contact with someone with a confirmed diagnosis of COVID-19 or PUI in the past 14 days without wearing appropriate PPE?  X   Have you been living in the same home as a person with confirmed diagnosis of COVID-19 or a PUI (household contact)?    X   Have you been diagnosed with COVID-19?    X              What to do next: Answered NO to all: Answered YES to anything:   Proceed with unit schedule Follow the [facility] Inpatient Flowsheet.   

## 2018-09-26 NOTE — Discharge Instructions (Signed)
For your behavioral health needs, you are advised to continue treatment with the [facility] ACT Team: ° °     [facility] ACT Team °     [facility], [address] °     [address] °     [name], [place]  [address] °     [phone] °     Crisis number: [phone] °

## 2018-09-26 NOTE — Progress Notes (Signed)
D: [name] has been calm, cooperative, and med compliant this a.m. She asked when the MD will be here. "I'm getting anxious." Pt says she is eager to discharge today. She denied SI, HI, and AVH. "I think the Lexapro will help." Pt is now in her room, resting with eyes closed and regular/even/unlabored respirations.   A: Scheduled meds given as ordered, with no adverse side effects noted or reported. Pt urged to bring concerns, needs, and questions to staff as needed. Q15 safety checks maintained.  R: Pt remains free from harm and continues with treatment. Will continue to monitor for needs/safety.

## 2018-09-26 NOTE — BH Assessment (Addendum)
[facility] Progress Note  Per Buford Dresser, DO, this pt does not require psychiatric hospitalization at this time.  Pt is to be discharged from the [facility] with recommendation to continue treatment with the [facility] Team.  This has been included in pt's discharge instructions.  Pt's nurse has been notified.  Jalene Mullet, [place] Triage Specialist [phone]

## 2018-09-29 ENCOUNTER — Ambulatory Visit (HOSPITAL_COMMUNITY)
Admission: RE | Admit: 2018-09-29 | Discharge: 2018-09-29 | Disposition: A | Discharge status: Home / Self Care | Payer: Medicaid Other | Attending: Psychiatry | Admitting: Psychiatry

## 2018-09-29 DIAGNOSIS — E118 Type 2 diabetes mellitus with unspecified complications: Secondary | ICD-10-CM | POA: Insufficient documentation

## 2018-09-29 DIAGNOSIS — F419 Anxiety disorder, unspecified: Secondary | ICD-10-CM | POA: Insufficient documentation

## 2018-09-29 DIAGNOSIS — F319 Bipolar disorder, unspecified: Secondary | ICD-10-CM | POA: Insufficient documentation

## 2018-09-29 NOTE — BH Assessment (Addendum)
Assessment Note  [name] is a single 28 y.o. female who presents to [facility] by way of ambulance.  Pt reports that she is upset because her boyfriend, DMX left her & she doesn't want to live in a hotel room anymore.  Pt also concerned about her mother being disrespectful to her, itchy pain in her arms, wanting to move back to a group home & that her "meds aren't working anymore". Pt denies SI, HI & AVH.    TTS contacted [facility] line at [phone] & spoke with [name]. [name] is know is him. He reports pt has recently moved back & forth from independent to assisted living arrangements. He states she doesn't like when group homes limit her coming & going, but now that he boyfriend is gone she wants to return again & she has used her housing money for this month already. [name] was informed pt had concerned regarding her current meds & her complaints about itching/burning hands & arms that may be related to diabetes. [name] knows pt's hx well. He stated that their therapist & case worker would go to see pt at her hotel tomorrow. This information was relayed to pt. She was cooperative with leaving [facility] except for not wanting to take bus home (she was given a bus ticket). Pt called a friend who said he would pick her up & bring her home.   Diagnosis: Bipolar I d/o Disposition: Stacy Rankin, NP recommends discharge & f/u to be made by ACTT tomorrow  Past Medical History:  Past Medical History:  Diagnosis Date  . Anxiety   . Bipolar 1 disorder ([place])   . Cognitive deficits   . Depression   . Diabetes mellitus without complication ([place])   . Hypertension   . Mental disorder   . Mental health disorder   . Obesity     Past Surgical History:  Procedure Laterality Date  . CESAREAN SECTION    . CESAREAN SECTION N/A 04/25/2013   Procedure: REPEAT CESAREAN SECTION;  Surgeon: [name], MD;  Location: [facility];  Service: Obstetrics;  Laterality: N/A;  . MASS  EXCISION N/A 06/03/2012   Procedure: EXCISION MASS;  Surgeon: [name], MD;  Location: [place];  Service: ENT;  Laterality: N/A;  Excision uvula mass  . TONSILLECTOMY N/A 06/03/2012   Procedure: TONSILLECTOMY;  Surgeon: [name], MD;  Location: [place];  Service: ENT;  Laterality: N/A;  . TONSILLECTOMY      Family History:  Family History  Problem Relation Age of Onset  . Hypertension Mother   . Diabetes Father     Social History:  reports that she has been smoking cigarettes. She has a 22.00 pack-year smoking history. She has never used smokeless tobacco. She reports that she does not drink alcohol or use drugs.  Additional Social History:  Alcohol / Drug Use Pain Medications: See MAR Prescriptions: See MAR Over the Counter: See MAR History of alcohol / drug use?: No history of alcohol / drug abuse Longest period of sobriety (when/how long): NA  CIWA: CIWA-Ar BP: (!) 124/99 Pulse Rate: 99 COWS:    Allergies:  Allergies  Allergen Reactions  . Wellbutrin [Bupropion] Shortness Of Breath  . Omnipaque [Iohexol] Swelling and Other (See Comments)    Reaction:  Eye swelling  . Penicillins Hives and Other (See Comments)    Has patient had a PCN reaction causing immediate rash, facial/tongue/throat swelling, SOB or lightheadedness with hypotension: No Has  patient had a PCN reaction causing severe rash involving mucus membranes or skin necrosis: No Has patient had a PCN reaction that required hospitalization No Has patient had a PCN reaction occurring within the last 10 years: No If all of the above answers are "NO", then may proceed with Cephalosporin use.  Herma Mering [Benztropine]     Make pt feel crazy  . Depakote Er [Divalproex Sodium Er] Nausea And Vomiting  . Divalproex Sodium   . Iohexol   . Penicillins     Home Medications: (Not in a hospital admission)   OB/GYN Status:  No LMP recorded.  General Assessment  Data Location of Assessment: [facility] Assessment Services TTS Assessment: In system Is this a Tele or Face-to-Face Assessment?: Face-to-Face Is this an Initial Assessment or a Re-assessment for this encounter?: Initial Assessment Patient Accompanied by:: N/A Language Other than English: No Living Arrangements: Other (Comment) What gender do you identify as?: Female Marital status: Single Living Arrangements: Other (Comment)(hotel) Can pt return to current living arrangement?: Yes Admission Status: Voluntary Is patient capable of signing voluntary admission?: Yes Referral Source: Self/Family/Friend Insurance type: medicaid  Medical Screening Exam ([place]) Medical Exam completed: Yes  Crisis Care Plan Living Arrangements: Other (Comment)(hotel) Name of Psychiatrist: "Margarita Grizzle" Name of Therapist: [facility]  Education Status Is patient currently in school?: No Is the patient employed, unemployed or receiving disability?: Receiving disability income  Risk to self with the past 6 months Suicidal Ideation: No Has patient been a risk to self within the past 6 months prior to admission? : Yes Suicidal Intent: No-Not Currently/Within Last 6 Months Has patient had any suicidal intent within the past 6 months prior to admission? : Yes Is patient at risk for suicide?: Yes Suicidal Plan?: No Has patient had any suicidal plan within the past 6 months prior to admission? : Yes Access to Means: No([facility]CTT minimizes number of pills in her care) What has been your use of drugs/alcohol within the last 12 months?: n/a Previous Attempts/Gestures: Yes How many times?: 1 Other Self Harm Risks: Depression Triggers for Past Attempts: Unpredictable Intentional Self Injurious Behavior: None Family Suicide History: Unknown Recent stressful life event(s): Loss (Comment)(break up with boyfriend) Persecutory voices/beliefs?: No Depression: Yes Depression Symptoms: Despondent,  Insomnia, Tearfulness, Isolating, Loss of interest in usual pleasures, Feeling angry/irritable Substance abuse history and/or treatment for substance abuse?: No Suicide prevention information given to non-admitted patients: Yes  Risk to Others within the past 6 months Homicidal Ideation: No Does patient have any lifetime risk of violence toward others beyond the six months prior to admission? : No Thoughts of Harm to Others: No Current Homicidal Intent: No Current Homicidal Plan: No Access to Homicidal Means: No History of harm to others?: No Assessment of Violence: None Noted Criminal Charges Pending?: No Does patient have a court date: No Is patient on probation?: No  Psychosis Hallucinations: None noted Delusions: Persecutory(people stare at her)  Mental Status Report Appearance/Hygiene: Bizarre Eye Contact: Good Motor Activity: Restlessness Speech: Logical/coherent Level of Consciousness: Alert Mood: Anxious, Pleasant Affect: Anxious Anxiety Level: Moderate Thought Processes: Relevant, Coherent Judgement: Partial Orientation: Person, Place, Time, Situation Obsessive Compulsive Thoughts/Behaviors: None  Cognitive Functioning Concentration: Fair Memory: Recent Intact, Remote Intact Is patient IDD: No Insight: Fair Impulse Control: Fair Appetite: Good Have you had any weight changes? : No Change Sleep: Decreased Total Hours of Sleep: 6  ADLScreening [facility] Assessment Services) Patient's cognitive ability adequate to safely complete daily activities?: Yes Patient able to express  need for assistance with ADLs?: Yes Independently performs ADLs?: Yes (appropriate for developmental age)  Prior Inpatient Therapy Prior Inpatient Therapy: Yes Prior Therapy Dates: 05/2018 Prior Therapy Facilty/Provider(s): [facility] Reason for Treatment: depression  Prior Outpatient Therapy Prior Outpatient Therapy: Yes Prior Therapy Dates: current Prior Therapy Facilty/Provider(s):  "Margarita Grizzle" Reason for Treatment: depression Does patient have an ACCT team?: Yes Does patient have Intensive In-House Services?  : No Does patient have [facility] services? : No Does patient have P4CC services?: No  ADL Screening (condition at time of admission) Patient's cognitive ability adequate to safely complete daily activities?: Yes Is the patient deaf or have difficulty hearing?: No Does the patient have difficulty seeing, even when wearing glasses/contacts?: No Does the patient have difficulty concentrating, remembering, or making decisions?: No Patient able to express need for assistance with ADLs?: Yes Does the patient have difficulty dressing or bathing?: No Independently performs ADLs?: Yes (appropriate for developmental age) Does the patient have difficulty walking or climbing stairs?: No Weakness of Legs: None Weakness of Arms/Hands: None  Home Assistive Devices/Equipment Home Assistive Devices/Equipment: None  Therapy Consults (therapy consults require a physician order) PT Evaluation Needed: No OT Evalulation Needed: No SLP Evaluation Needed: No Abuse/Neglect Assessment (Assessment to be complete while patient is alone) Abuse/Neglect Assessment Can Be Completed: Yes Physical Abuse: Denies Verbal Abuse: Denies Sexual Abuse: Denies Exploitation of patient/patient's resources: Denies Self-Neglect: Denies Values / Beliefs Cultural Requests During Hospitalization: None Spiritual Requests During Hospitalization: None Consults Spiritual Care Consult Needed: No Social Work Consult Needed: No Regulatory affairs officer (For Healthcare) Does Patient Have a Medical Advance Directive?: No Would patient like information on creating a medical advance directive?: No - Patient declined          Disposition:  Stacy Rankin, NP recommends discharge & f/u to be made by ACTT tomorrow Disposition Initial Assessment Completed for this Encounter: Yes Disposition of Patient:  Discharge  On Site Evaluation by:   Reviewed with Physician:    Richardean Chimera 09/29/2018 6:18 PM

## 2018-09-29 NOTE — H&P (Signed)
Behavioral Health Medical Screening Exam  [name] is an 28 y.o. female presents to [facility] as a walk in with complaints "I met a man named DMX at the [employer]; we was seeing each other and today he tells me he don't want to see me cause he don't date girls with bipolar disorder.  He crazy his damn self; talking to his self.  My mama don't respect me and don't care about me.  I'm having this pain in my hands and arms like needles."  Patient states that she is diabetic; patient informed that the pins/needles in arms and hands could be neuropathy and needed to see the doctor that took care of her diabetes for mediatation.  Patient states she did not like living a lone; patient informed that she needed to speak with her ACTT team to assist her in find a group home; but we could give her a list so that she could also call around and visit.  Patient states that she want to have her medication changed because doesn't feel that it is working; patient informed that she needed to see her ACTT team and psychiatrist to have that done.  Patient denies suicidal/self-harm/homicidal ideation, psychosis, and paranoia.  TTS spoke with ACTT team member; see not for details; but states they will see her tomorrow.      Total Time spent with patient: 45 minutes  Psychiatric Specialty Exam: Physical Exam  Constitutional: She is oriented to person, place, and time. She appears well-developed and well-nourished. No distress.  Neck: Normal range of motion.  Respiratory: Effort normal.  Musculoskeletal: Normal range of motion.  Neurological: She is alert and oriented to person, place, and time.  Skin: Skin is warm and dry.  Psychiatric: Her mood appears anxious (Stable). Thought content is not paranoid and not delusional. Cognition and memory are normal. She exhibits a depressed mood (Stable). She expresses no homicidal and no suicidal ideation.    Review of Systems  Psychiatric/Behavioral: Depression: Stable.  Hallucinations: denies. Substance abuse: Denies. Suicidal ideas: Denies. Nervous/anxious: Stable. Insomnia: Denies.   All other systems reviewed and are negative.   Blood pressure (!) 124/99, pulse 99, SpO2 98 %.There is no height or weight on file to calculate BMI.  General Appearance: Casual  Eye Contact:  Good  Speech:  Clear and Coherent and Normal Rate  Volume:  Increased  Mood:  Anxious  Affect:  Labile  Thought Process:  Coherent and Goal Directed  Orientation:  Full (Time, Place, and Person)  Thought Content:  WDL  Suicidal Thoughts:  No  Homicidal Thoughts:  No  Memory:  Immediate;   Good  Judgement:  Fair  Insight:  Present  Psychomotor Activity:  Normal  Concentration: Concentration: Fair  Recall:  Good  Fund of Knowledge:Fair  Language: Fair  Akathisia:  No  Handed:  Right  AIMS (if indicated):     Assets:  Communication Skills Desire for Improvement Housing Resilience Social Support  Sleep:       Musculoskeletal: Strength & Muscle Tone: within normal limits Gait & Station: normal Patient leans: N/A  Blood pressure (!) 124/99, pulse 99, SpO2 98 %.  Recommendations:  Follow up with ACTT team tomorrow; Follow up with PCP or Endocrinologist related to neuropathy.    Based on my evaluation the patient does not appear to have an emergency medical condition.   Disposition: No evidence of imminent risk to self or others at present.   Patient does not meet criteria for  psychiatric inpatient admission. Supportive therapy provided about ongoing stressors. Discussed crisis plan, support from social network, calling 911, coming to the Emergency Department, and calling Suicide Hotline.  [name], NP 09/29/2018, 5:33 PM

## 2018-10-10 ENCOUNTER — Encounter (HOSPITAL_COMMUNITY): Payer: Self-pay | Admitting: *Deleted

## 2018-10-10 ENCOUNTER — Emergency Department (HOSPITAL_COMMUNITY)
Admission: EM | Admit: 2018-10-10 | Discharge: 2018-10-10 | Disposition: A | Discharge status: Home / Self Care | Payer: Medicaid Other | Attending: Emergency Medicine | Admitting: Emergency Medicine

## 2018-10-10 ENCOUNTER — Encounter (HOSPITAL_COMMUNITY): Payer: Self-pay

## 2018-10-10 ENCOUNTER — Other Ambulatory Visit: Payer: Self-pay

## 2018-10-10 ENCOUNTER — Observation Stay (HOSPITAL_COMMUNITY)
Admission: RE | Admit: 2018-10-10 | Discharge: 2018-10-11 | Disposition: A | Discharge status: Home / Self Care | Payer: Medicaid Other | Attending: Psychiatry | Admitting: Psychiatry

## 2018-10-10 DIAGNOSIS — Z833 Family history of diabetes mellitus: Secondary | ICD-10-CM | POA: Insufficient documentation

## 2018-10-10 DIAGNOSIS — E119 Type 2 diabetes mellitus without complications: Secondary | ICD-10-CM | POA: Diagnosis not present

## 2018-10-10 DIAGNOSIS — R45851 Suicidal ideations: Secondary | ICD-10-CM | POA: Insufficient documentation

## 2018-10-10 DIAGNOSIS — E669 Obesity, unspecified: Secondary | ICD-10-CM | POA: Insufficient documentation

## 2018-10-10 DIAGNOSIS — F319 Bipolar disorder, unspecified: Secondary | ICD-10-CM | POA: Diagnosis not present

## 2018-10-10 DIAGNOSIS — R4189 Other symptoms and signs involving cognitive functions and awareness: Secondary | ICD-10-CM | POA: Insufficient documentation

## 2018-10-10 DIAGNOSIS — F4325 Adjustment disorder with mixed disturbance of emotions and conduct: Principal | ICD-10-CM | POA: Insufficient documentation

## 2018-10-10 DIAGNOSIS — F250 Schizoaffective disorder, bipolar type: Secondary | ICD-10-CM | POA: Insufficient documentation

## 2018-10-10 DIAGNOSIS — F429 Obsessive-compulsive disorder, unspecified: Secondary | ICD-10-CM | POA: Insufficient documentation

## 2018-10-10 DIAGNOSIS — Z888 Allergy status to other drugs, medicaments and biological substances status: Secondary | ICD-10-CM | POA: Insufficient documentation

## 2018-10-10 DIAGNOSIS — Z880 Allergy status to penicillin: Secondary | ICD-10-CM | POA: Diagnosis not present

## 2018-10-10 DIAGNOSIS — F329 Major depressive disorder, single episode, unspecified: Secondary | ICD-10-CM | POA: Diagnosis present

## 2018-10-10 DIAGNOSIS — R102 Pelvic and perineal pain: Secondary | ICD-10-CM

## 2018-10-10 DIAGNOSIS — I10 Essential (primary) hypertension: Secondary | ICD-10-CM | POA: Insufficient documentation

## 2018-10-10 DIAGNOSIS — F419 Anxiety disorder, unspecified: Secondary | ICD-10-CM | POA: Diagnosis not present

## 2018-10-10 DIAGNOSIS — Z794 Long term (current) use of insulin: Secondary | ICD-10-CM | POA: Insufficient documentation

## 2018-10-10 DIAGNOSIS — Z8249 Family history of ischemic heart disease and other diseases of the circulatory system: Secondary | ICD-10-CM | POA: Insufficient documentation

## 2018-10-10 DIAGNOSIS — F17210 Nicotine dependence, cigarettes, uncomplicated: Secondary | ICD-10-CM | POA: Insufficient documentation

## 2018-10-10 DIAGNOSIS — Z79899 Other long term (current) drug therapy: Secondary | ICD-10-CM | POA: Diagnosis not present

## 2018-10-10 DIAGNOSIS — F600 Paranoid personality disorder: Secondary | ICD-10-CM

## 2018-10-10 LAB — URINALYSIS, ROUTINE W REFLEX MICROSCOPIC
Bilirubin Urine: NEGATIVE
Glucose, UA: NEGATIVE mg/dL
Hgb urine dipstick: NEGATIVE
Ketones, ur: NEGATIVE mg/dL
Leukocytes,Ua: NEGATIVE
Nitrite: NEGATIVE
Protein, ur: NEGATIVE mg/dL
Specific Gravity, Urine: 1.028 (ref 1.005–1.030)
pH: 6 (ref 5.0–8.0)

## 2018-10-10 LAB — WET PREP, GENITAL
Clue Cells Wet Prep HPF POC: NONE SEEN
Sperm: NONE SEEN
Trich, Wet Prep: NONE SEEN
Yeast Wet Prep HPF POC: NONE SEEN

## 2018-10-10 LAB — GLUCOSE, CAPILLARY
Glucose-Capillary: 67 mg/dL — ABNORMAL LOW (ref 70–99)
Glucose-Capillary: 129 mg/dL — ABNORMAL HIGH (ref 70–99)
Glucose-Capillary: 167 mg/dL — ABNORMAL HIGH (ref 70–99)

## 2018-10-10 LAB — LITHIUM LEVEL: Lithium Lvl: 0.47 mmol/L — ABNORMAL LOW (ref 0.60–1.20)

## 2018-10-10 MED ORDER — INSULIN DETEMIR 100 UNIT/ML ~~LOC~~ SOLN
40.0000 [IU] | Freq: Every day | SUBCUTANEOUS | Status: DC
  Administered 2018-10-10: 40 [IU] via SUBCUTANEOUS

## 2018-10-10 MED ORDER — ACETAMINOPHEN 325 MG PO TABS: 650.0000 mg | ORAL_TABLET | Freq: Four times a day (QID) | ORAL | Status: DC | PRN

## 2018-10-10 MED ORDER — PROPRANOLOL HCL 10 MG PO TABS
10.0000 mg | ORAL_TABLET | Freq: Two times a day (BID) | ORAL | Status: DC
  Administered 2018-10-10: 10 mg via ORAL
  Filled 2018-10-10: qty 1

## 2018-10-10 MED ORDER — MAGNESIUM HYDROXIDE 400 MG/5ML PO SUSP: 30.0000 mL | Freq: Every day | ORAL | Status: DC | PRN

## 2018-10-10 MED ORDER — ALUM & MAG HYDROXIDE-SIMETH 200-200-20 MG/5ML PO SUSP: 30.0000 mL | ORAL | Status: DC | PRN

## 2018-10-10 MED ORDER — FLUVOXAMINE MALEATE 50 MG PO TABS
50.0000 mg | ORAL_TABLET | Freq: Every day | ORAL | Status: DC
  Administered 2018-10-10: 22:00:00 50 mg via ORAL
  Filled 2018-10-10 (×3): qty 1

## 2018-10-10 MED ORDER — TRAZODONE HCL 50 MG PO TABS
50.0000 mg | ORAL_TABLET | Freq: Every evening | ORAL | Status: DC | PRN
  Filled 2018-10-10: qty 1

## 2018-10-10 NOTE — BH Assessment (Signed)
[place] Assessment Progress Note   Case was staffed with [name] who recommended patient be observed and monitored.

## 2018-10-10 NOTE — ED Notes (Signed)
Bed: [facility] Expected date:  Expected time:  Means of arrival:  Comments: 

## 2018-10-10 NOTE — H&P (Signed)
[facility] Provider Admission PAA/H&P  Patient Identification: [name] MRN:  [id number] Date of Evaluation:  10/10/2018 Chief Complaint:  Bipolar 1 Disorder Principal Diagnosis: <principal problem not specified> Diagnosis:  Active Problems:   * No active hospital problems. *  History of Present Illness: [name] 28 year old African-American female presented as a behavioral walk-in with suicidal plan and intent to overdose if she does not get admitted.  She reports taking and tolerating medications as prescribed by her primary care provider.  Patient reports she is followed by [facility] and was recently evaluated today.  States worsening paranoia " I get really paranoid when I read the bus."  Reported feeling her medications are not working.  Patient multiple states recent break-up with a boyfriend who is on and off.  Reported she is attempting to break it completely off with him but has not been successful.  Chart review previous inpatient admissions.  Patient reported attempted overdose x2 weeks ago.  Support encouragement reassurance was provided.   Per TTS assessment note:[name] is an 28 y.o. female that presents this date with S/I. Patient denies any H/I or AVH. Patient reports she has a plan to overdose on her home medications. Patient has a history of Schizoaffective disorder presenting to [facility]  with a chief complaint of suicidal ideation. Patient states "I hate living this way and tired of not being heard." Patient reports ongoing conflict with her partner (boyfriend) who she says "does not understand her." Patient will not elaborate on content and seems at times, to have difficulty verbalizing her concerns. Patient is currently receiving services from [facility] who assists with medication management.  Patient reports she has a plan to overdose with buspar and lithium. She also reports her medication isn't working as she feels angry, depressed and paranoid "all the time."  Patient reports she is also depressed because her boyfriend is talking to other women and is "going to cheat on her." Patient has an extensive mental health history and has presented to emergency departments over 30 times in the past 6 months. Patient reports she is currently compliant on hermedications, however she reports they aren't working for her. Patient reports current suicidal ideation with plans ofoverdosing on her medications. Patient reports she has attempted self harm over a dozen times in her life. Patient acknowledges symptoms including: sadness, fatigue, guilt, low self esteem, lack of motivation and ongoing paranoia. Patient denieshomicidal ideation or history of violence. Patientdeniesauditory or visual hallucinations or other psychotic symptoms. Patient denies any current SA issues. Patient denies history of abuse and trauma . Patient is unemployed and receiving disability. Patient haspoorinsight andpoorjudgment. Patient is alert oriented x4 with normal speech and normal motor behavior. Eye contact is good. Patient's mood is depressed and affect congruent with mood. There is no indication patient is currently responding to internal stimuli or experiencing delusional thought content. Case was staffed with [name] who recommended patient be observed and monitored.   Associated Signs/Symptoms: Depression Symptoms:  depressed mood, (Hypo) Manic Symptoms:  Distractibility, Anxiety Symptoms:  Excessive Worry, Psychotic Symptoms:  Hallucinations: None PTSD Symptoms: NA Total Time spent with patient: 15 minutes  Past Psychiatric History:   Is the patient at risk to self? Yes.    Has the patient been a risk to self in the past 6 months? Yes.    Has the patient been a risk to self within the distant past? Yes.    Is the patient a risk to others? No.  Has  the patient been a risk to others in the past 6 months? No.  Has the patient been a risk to others within the distant past? No.    Prior Inpatient Therapy:   Prior Outpatient Therapy:    Alcohol Screening:   Substance Abuse History in the last 12 months:   Consequences of Substance Abuse: NA Previous Psychotropic Medications: No  Psychological Evaluations: No  Past Medical History:  Past Medical History:  Diagnosis Date  . Anxiety   . Bipolar 1 disorder ([place])   . Cognitive deficits   . Depression   . Diabetes mellitus without complication ([place])   . Hypertension   . Mental disorder   . Mental health disorder   . Obesity     Past Surgical History:  Procedure Laterality Date  . CESAREAN SECTION    . CESAREAN SECTION N/A 04/25/2013   Procedure: REPEAT CESAREAN SECTION;  Surgeon: [name], MD;  Location: [facility];  Service: Obstetrics;  Laterality: N/A;  . MASS EXCISION N/A 06/03/2012   Procedure: EXCISION MASS;  Surgeon: [name], MD;  Location: [facility];  Service: ENT;  Laterality: N/A;  Excision uvula mass  . TONSILLECTOMY N/A 06/03/2012   Procedure: TONSILLECTOMY;  Surgeon: [name], MD;  Location: [facility];  Service: ENT;  Laterality: N/A;  . TONSILLECTOMY     Family History:  Family History  Problem Relation Age of Onset  . Hypertension Mother   . Diabetes Father    Family Psychiatric History:  Tobacco Screening:   Social History:  Social History   Substance and Sexual Activity  Alcohol Use Never  . Frequency: Never     Social History   Substance and Sexual Activity  Drug Use Never   Comment: Patient denies    Additional Social History:                           Allergies:   Allergies  Allergen Reactions  . Wellbutrin [Bupropion] Shortness Of Breath  . Omnipaque [Iohexol] Swelling and Other (See Comments)    Reaction:  Eye swelling  . Penicillins Hives and Other (See Comments)    Has patient had a PCN reaction causing immediate rash, facial/tongue/throat swelling, SOB or lightheadedness with hypotension: Unknown Has  patient had a PCN reaction causing severe rash involving mucus membranes or skin necrosis: Yes Has patient had a PCN reaction that required hospitalization Unknown Has patient had a PCN reaction occurring within the last 10 years: No If all of the above answers are "NO", then may proceed with Cephalosporin use.  Herma Mering [Benztropine]     Make pt feel crazy  . Depakote Er [Divalproex Sodium Er] Nausea And Vomiting  . Divalproex Sodium   . Iohexol    Lab Results:  Results for orders placed or performed during the hospital encounter of 10/10/18 (from the past 48 hour(s))  Wet prep, genital     Status: Abnormal   Collection Time: 10/10/18  7:24 AM   Specimen: Vaginal  Result Value Ref Range   Yeast Wet Prep HPF POC NONE SEEN NONE SEEN   Trich, Wet Prep NONE SEEN NONE SEEN   Clue Cells Wet Prep HPF POC NONE SEEN NONE SEEN   WBC, Wet Prep HPF POC FEW (A) NONE SEEN   Sperm NONE SEEN     Comment: Performed at [facility], [place] [address] [place], [place] [address]  Urinalysis, Routine w reflex  microscopic     Status: Abnormal   Collection Time: 10/10/18  7:24 AM  Result Value Ref Range   Color, Urine YELLOW YELLOW   APPearance HAZY (A) CLEAR   Specific Gravity, Urine 1.028 1.005 - 1.030   pH 6.0 5.0 - 8.0   Glucose, UA NEGATIVE NEGATIVE mg/dL   Hgb urine dipstick NEGATIVE NEGATIVE   Bilirubin Urine NEGATIVE NEGATIVE   Ketones, ur NEGATIVE NEGATIVE mg/dL   Protein, ur NEGATIVE NEGATIVE mg/dL   Nitrite NEGATIVE NEGATIVE   Leukocytes,Ua NEGATIVE NEGATIVE    Comment: Performed at [facility] [address], [place], [place] [address]    Blood Alcohol level:  Lab Results  Component Value Date   [facility] <10 06/12/2018   ETH <10 85/88/5027    Metabolic Disorder Labs:  Lab Results  Component Value Date   HGBA1C 5.5 09/26/2018   MPG 111.15 09/26/2018   MPG 151.33 05/04/2018   Lab Results  Component Value Date   PROLACTIN 36.7 (H)  12/01/2017   PROLACTIN 37.3 (H) 07/18/2015   Lab Results  Component Value Date   CHOL 169 05/04/2018   TRIG 163 (H) 05/04/2018   HDL 42 05/04/2018   CHOLHDL 4.0 05/04/2018   VLDL 33 05/04/2018   LDLCALC 94 05/04/2018   LDLCALC 147 (H) 12/01/2017    Current Medications: No current facility-administered medications for this encounter.    PTA Medications: Medications Prior to Admission  Medication Sig Dispense Refill Last Dose  . ABILIFY MAINTENA 400 MG PRSY prefilled syringe Inject 400 mg into the muscle every 28 (twenty-eight) days. Next dose on 05/30/2018 1 each 6   . clomiPRAMINE (ANAFRANIL) 25 MG capsule Take 25 mg by mouth 2 (two) times daily as needed (nerve pain).      . fluvoxaMINE (LUVOX) 100 MG tablet Take 1 tablet (100 mg total) by mouth at bedtime. (Patient not taking: Reported on 10/10/2018) 60 tablet 1   . fluvoxaMINE (LUVOX) 100 MG tablet Take 1 tablet (100 mg total) by mouth at bedtime. (Patient not taking: Reported on 10/10/2018) 150 tablet 0   . fluvoxaMINE (LUVOX) 50 MG tablet Take 50 mg by mouth at bedtime.      . hydrochlorothiazide (HYDRODIURIL) 25 MG tablet Take 25 mg by mouth daily.     . insulin detemir (LEVEMIR) 100 UNIT/ML injection Inject 40 Units into the skin 2 (two) times daily.      Marland Kitchen lithium carbonate (ESKALITH) 450 MG CR tablet Take 900 mg by mouth at bedtime.      . propranolol (INDERAL) 10 MG tablet Take 10 mg by mouth 2 (two) times a day.     . valACYclovir (VALTREX) 1000 MG tablet Take 1 tablet (1,000 mg total) by mouth daily. (Patient not taking: Reported on 10/10/2018) 20 tablet 1     Musculoskeletal: Strength & Muscle Tone: within normal limits Gait & Station: normal Patient leans: N/A  Psychiatric Specialty Exam: Physical Exam  Vitals reviewed. Constitutional: She appears well-developed.  Cardiovascular: Normal rate.  Neurological: She is alert.  Psychiatric: She has a normal mood and affect. Her behavior is normal.    Review of Systems   Psychiatric/Behavioral: Positive for suicidal ideas.  All other systems reviewed and are negative.   Blood pressure 127/74, pulse 68, temperature 98 F (36.7 C), resp. rate 16.There is no height or weight on file to calculate BMI.  General Appearance: Disheveled  Eye Contact:  Fair  Speech:  Clear and Coherent and Pressured  Volume:  Normal  Mood:  Anxious and Depressed  Affect:  Depressed and Flat  Thought Process:  Linear  Orientation:  Full (Time, Place, and Person)  Thought Content:  Paranoid Ideation  Suicidal Thoughts:  Yes.  with intent/plan to overdose on medicaitons  Homicidal Thoughts:  No  Memory:  Immediate;   Fair Recent;   Fair  Judgement:  Fair  Insight:  Fair  Psychomotor Activity:  Normal  Concentration:  Concentration: Fair  Recall:  [employer] of Knowledge:  Fair  Language:  Fair  Akathisia:  No  Handed:  Right  AIMS (if indicated):     Assets:  Communication Skills Desire for Improvement Resilience Social Support  ADL's:  Intact  Cognition:  WNL  Sleep:         Treatment Plan Summary: Daily contact with patient to assess and evaluate symptoms and progress in treatment and Medication management    Observation Level/Precautions:  15 minute checks Laboratory:  CBC Chemistry Profile HbAIC UDS UA Psychotherapy:  Individual and group sessions Medications:  Home medications was restarted where appropriate Consultations:  CSW and Psychiatry- TTS Discharge Concerns:  Safety, stabilization, and risk of access to medication and medication stabilization  Estimated LOS: 5-7da days Other:      Derrill [place], NP 6/15/20202:38 PM

## 2018-10-10 NOTE — ED Notes (Signed)
[name] also states that she' shaving depression right now, she has a lot going on, she couldn't tell me whether or not she was suicidal at this time

## 2018-10-10 NOTE — Progress Notes (Signed)
Patient ID: [name], female   DOB: 25-Nov-1990, 28 y.o.   MRN: [id number] Pt A&O x 3, sleeping at present, no distress noted, calm & cooperative at present.  Remains passive SI, demanding to be placed in facility.  Monitoring for safety.

## 2018-10-10 NOTE — BH Assessment (Signed)
Assessment Note  [name] is an 28 y.o. female that presents this date with S/I. Patient denies any H/I or AVH. Patient reports she has a plan to overdose on her home medications. Patient has a history of Schizoaffective disorder presenting to [facility]  with a chief complaint of suicidal ideation. Patient states "I hate living this way and tired of not being heard." Patient reports ongoing conflict with her partner (boyfriend) who she says "does not understand her." Patient will not elaborate on content and seems at times, to have difficulty verbalizing her concerns. Patient is currently receiving services from [facility] who assists with medication management.  Patient reports she has a plan to overdose with buspar and lithium. She also reports her medication isn't working as she feels angry, depressed and paranoid "all the time." Patient reports she is also depressed because her boyfriend is talking to other women and is "going to cheat on her." Patient has an extensive mental health history and has presented to emergency departments over 30 times in the past 6 months. Patient reports she is currently compliant on her medications, however she reports they aren't working for her. Patient reports current suicidal ideation with plans of overdosing on her medications. Patient reports she has attempted self harm over a dozen times in her life. Patient acknowledges symptoms including: sadness, fatigue, guilt, low self esteem, lack of motivation and ongoing paranoia. Patient denies homicidal ideation or history of violence. Patient denies auditory or visual hallucinations or other psychotic symptoms. Patient denies any current SA issues. Patient denies history of abuse and trauma . Patient is unemployed and receiving disability. Patient has poor insight and poor judgment. Patient is alert oriented x4 with normal speech and normal motor behavior. Eye contact is good. Patient's mood is depressed and affect congruent  with mood. There is no indication patient is currently responding to internal stimuli or experiencing delusional thought content. Case was staffed with [name] who recommended patient be observed and monitored.   Diagnosis: F25.0 Schizoaffective disorder, bipolar type  Past Medical History:  Past Medical History:  Diagnosis Date  . Anxiety   . Bipolar 1 disorder ([place])   . Cognitive deficits   . Depression   . Diabetes mellitus without complication ([place])   . Hypertension   . Mental disorder   . Mental health disorder   . Obesity     Past Surgical History:  Procedure Laterality Date  . CESAREAN SECTION    . CESAREAN SECTION N/A 04/25/2013   Procedure: REPEAT CESAREAN SECTION;  Surgeon: [name], MD;  Location: [facility];  Service: Obstetrics;  Laterality: N/A;  . MASS EXCISION N/A 06/03/2012   Procedure: EXCISION MASS;  Surgeon: [name], MD;  Location: [facility];  Service: ENT;  Laterality: N/A;  Excision uvula mass  . TONSILLECTOMY N/A 06/03/2012   Procedure: TONSILLECTOMY;  Surgeon: [name], MD;  Location: [facility];  Service: ENT;  Laterality: N/A;  . TONSILLECTOMY      Family History:  Family History  Problem Relation Age of Onset  . Hypertension Mother   . Diabetes Father     Social History:  reports that she has been smoking cigarettes. She has a 22.00 pack-year smoking history. She has never used smokeless tobacco. She reports that she does not drink alcohol or use drugs.  Additional Social History:  Alcohol / Drug Use Pain Medications: See MAR Prescriptions: See MAR Over the Counter: See MAR History of alcohol / drug  use?: No history of alcohol / drug abuse Longest period of sobriety (when/how long): NA Negative Consequences of Use: (Denies)  CIWA: CIWA-Ar BP: 127/74 Pulse Rate: 68 COWS:    Allergies:  Allergies  Allergen Reactions  . Wellbutrin [Bupropion] Shortness Of Breath  . Omnipaque [Iohexol] Swelling  and Other (See Comments)    Reaction:  Eye swelling  . Penicillins Hives and Other (See Comments)    Has patient had a PCN reaction causing immediate rash, facial/tongue/throat swelling, SOB or lightheadedness with hypotension: Unknown Has patient had a PCN reaction causing severe rash involving mucus membranes or skin necrosis: Yes Has patient had a PCN reaction that required hospitalization Unknown Has patient had a PCN reaction occurring within the last 10 years: No If all of the above answers are "NO", then may proceed with Cephalosporin use.  Herma Mering [Benztropine]     Make pt feel crazy  . Depakote Er [Divalproex Sodium Er] Nausea And Vomiting  . Divalproex Sodium   . Iohexol     Home Medications:  Medications Prior to Admission  Medication Sig Dispense Refill  . ABILIFY MAINTENA 400 MG PRSY prefilled syringe Inject 400 mg into the muscle every 28 (twenty-eight) days. Next dose on 05/30/2018 1 each 6  . clomiPRAMINE (ANAFRANIL) 25 MG capsule Take 25 mg by mouth 2 (two) times daily as needed (nerve pain).     . fluvoxaMINE (LUVOX) 100 MG tablet Take 1 tablet (100 mg total) by mouth at bedtime. (Patient not taking: Reported on 10/10/2018) 60 tablet 1  . fluvoxaMINE (LUVOX) 100 MG tablet Take 1 tablet (100 mg total) by mouth at bedtime. (Patient not taking: Reported on 10/10/2018) 150 tablet 0  . fluvoxaMINE (LUVOX) 50 MG tablet Take 50 mg by mouth at bedtime.     . hydrochlorothiazide (HYDRODIURIL) 25 MG tablet Take 25 mg by mouth daily.    . insulin detemir (LEVEMIR) 100 UNIT/ML injection Inject 40 Units into the skin 2 (two) times daily.     Marland Kitchen lithium carbonate (ESKALITH) 450 MG CR tablet Take 900 mg by mouth at bedtime.     . propranolol (INDERAL) 10 MG tablet Take 10 mg by mouth 2 (two) times a day.    . valACYclovir (VALTREX) 1000 MG tablet Take 1 tablet (1,000 mg total) by mouth daily. (Patient not taking: Reported on 10/10/2018) 20 tablet 1    OB/GYN Status:  No LMP  recorded.  General Assessment Data Location of Assessment: [facility] TTS Assessment: In system Is this a Tele or Face-to-Face Assessment?: Face-to-Face Is this an Initial Assessment or a Re-assessment for this encounter?: Initial Assessment Patient Accompanied by:: N/A Language Other than English: No Living Arrangements: Other (Comment) What gender do you identify as?: Female Marital status: Single Living Arrangements: Other (Comment)(Friends) Can pt return to current living arrangement?: Yes Admission Status: Voluntary Is patient capable of signing voluntary admission?: Yes Referral Source: Self/Family/Friend Insurance type: Medicaid  Medical Screening Exam ([place]) Medical Exam completed: Yes  Crisis Care Plan Living Arrangements: Other (Comment)(Friends) Name of Psychiatrist: [facility] Name of Therapist: [facility]  Education Status Is patient currently in school?: No Is the patient employed, unemployed or receiving disability?: Receiving disability income  Risk to self with the past 6 months Suicidal Ideation: Yes-Currently Present Has patient been a risk to self within the past 6 months prior to admission? : Yes Suicidal Intent: Yes-Currently Present Has patient had any suicidal intent within the past 6 months prior to admission? :  Yes Is patient at risk for suicide?: Yes Suicidal Plan?: Yes-Currently Present Has patient had any suicidal plan within the past 6 months prior to admission? : Yes Specify Current Suicidal Plan: Overdose Access to Means: Yes Specify Access to Suicidal Means: has medications What has been your use of drugs/alcohol within the last 12 months?: Denies Previous Attempts/Gestures: Yes How many times?: 1 Other Self Harm Risks: (NA) Triggers for Past Attempts: Unknown Intentional Self Injurious Behavior: None Family Suicide History: Unknown Recent stressful life event(s): Conflict (Comment)(Conflict with  partner) Persecutory voices/beliefs?: No Depression: Yes Depression Symptoms: Feeling worthless/self pity Substance abuse history and/or treatment for substance abuse?: No Suicide prevention information given to non-admitted patients: Not applicable  Risk to Others within the past 6 months Homicidal Ideation: No Does patient have any lifetime risk of violence toward others beyond the six months prior to admission? : No Thoughts of Harm to Others: No Comment - Thoughts of Harm to Others: NA Current Homicidal Intent: No Current Homicidal Plan: No Access to Homicidal Means: No Identified Victim: NA History of harm to others?: No Assessment of Violence: None Noted Violent Behavior Description: NA Does patient have access to weapons?: No Criminal Charges Pending?: No Does patient have a court date: No Is patient on probation?: No  Psychosis Hallucinations: None noted Delusions: None noted  Mental Status Report Appearance/Hygiene: Unremarkable Eye Contact: Fair Motor Activity: Freedom of movement Speech: Logical/coherent Level of Consciousness: Alert Mood: Depressed, Anxious Affect: Appropriate to circumstance Anxiety Level: Moderate Thought Processes: Coherent, Relevant Judgement: Partial Orientation: Person, Place, Time Obsessive Compulsive Thoughts/Behaviors: None  Cognitive Functioning Concentration: Normal Memory: Recent Intact, Remote Intact Is patient IDD: No Insight: Fair Impulse Control: Fair Appetite: Good Have you had any weight changes? : No Change Sleep: No Change Total Hours of Sleep: 7 Vegetative Symptoms: None  ADLScreening [facility] Assessment Services) Patient's cognitive ability adequate to safely complete daily activities?: Yes Patient able to express need for assistance with ADLs?: Yes Independently performs ADLs?: Yes (appropriate for developmental age)  Prior Inpatient Therapy Prior Inpatient Therapy: Yes Prior Therapy Dates: 2020 Prior  Therapy Facilty/Provider(s): [facility] Reason for Treatment: MH issues  Prior Outpatient Therapy Prior Outpatient Therapy: Yes Prior Therapy Dates: Ongoing Prior Therapy Facilty/Provider(s): [facility] Reason for Treatment: Med mang Does patient have an ACCT team?: Yes Does patient have Intensive In-House Services?  : No Does patient have [facility] services? : No Does patient have P4CC services?: No  ADL Screening (condition at time of admission) Patient's cognitive ability adequate to safely complete daily activities?: Yes Is the patient deaf or have difficulty hearing?: No Does the patient have difficulty seeing, even when wearing glasses/contacts?: No Does the patient have difficulty concentrating, remembering, or making decisions?: No Patient able to express need for assistance with ADLs?: Yes Does the patient have difficulty dressing or bathing?: No Independently performs ADLs?: Yes (appropriate for developmental age) Does the patient have difficulty walking or climbing stairs?: No Weakness of Legs: None Weakness of Arms/Hands: None  Home Assistive Devices/Equipment Home Assistive Devices/Equipment: None  Therapy Consults (therapy consults require a physician order) PT Evaluation Needed: No OT Evalulation Needed: No SLP Evaluation Needed: No Abuse/Neglect Assessment (Assessment to be complete while patient is alone) Physical Abuse: Denies Verbal Abuse: Denies Sexual Abuse: Denies Exploitation of patient/patient's resources: Denies Self-Neglect: Denies Values / Beliefs Cultural Requests During Hospitalization: None Spiritual Requests During Hospitalization: None Consults Spiritual Care Consult Needed: No Social Work Consult Needed: No Regulatory affairs officer (For Healthcare) Does Patient Have a Medical Advance  Directive?: No Would patient like information on creating a medical advance directive?: No - Patient declined          Disposition: Case was staffed with [name] who  recommended patient be observed and monitored.   Disposition Initial Assessment Completed for this Encounter: Yes Disposition of Patient: (Observe and monitor) Patient refused recommended treatment: No Mode of transportation if patient is discharged/movement?: (Unk)  On Site Evaluation by:   Reviewed with Physician:    Mamie Nick 10/10/2018 3:19 PM

## 2018-10-10 NOTE — Discharge Instructions (Addendum)
You may alternate ibuprofen or tylenol for your pain. Please follow up with your primary care physician. Your GC results will be called to you if positive.

## 2018-10-10 NOTE — ED Triage Notes (Signed)
Pt told EMS that she's had a runny nose for three weeks and her mom told her to come get tested for COVID Pt tells registration she has vaginal pain

## 2018-10-10 NOTE — ED Provider Notes (Signed)
[facility] DEPT Provider Note   CSN: [id number] Arrival date & time: 10/10/18  0608    History   Chief Complaint No chief complaint on file.   HPI [name] is a 28 y.o. female.     28 y.o female with a PMH of HTN, DM, Bipolar 1 presents to the ED with a chief complaint of vaginal pain x 2 months.  Patient reports she has been experiencing pain on the outside of her vaginal area since her new partner.  She describes this pain as dull, worse with palpation, worse along the outer labia's.  Patient has not tried any medication for relieving symptoms.  She is currently sexually active.  She denies any vaginal discharge, urinary problems, fevers or abdominal pain.     The history is provided by the patient.    Past Medical History:  Diagnosis Date  . Anxiety   . Bipolar 1 disorder ([place])   . Cognitive deficits   . Depression   . Diabetes mellitus without complication ([place])   . Hypertension   . Mental disorder   . Mental health disorder   . Obesity     Patient Active Problem List   Diagnosis Date Noted  . Schizoaffective disorder, bipolar type ([place]) 09/25/2018  . Bipolar 1 disorder ([place]) 06/13/2018  . HTN (hypertension) 05/03/2018  . Tobacco use disorder 05/03/2018  . Bipolar I disorder, most recent episode depressed, severe without psychotic features ([place]) 05/02/2018  . Adjustment disorder with emotional disturbance 01/02/2018  . 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 Moderate bipolar I disorder, most recent episode depressed ([place])   . Psychosis ([place])   . 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 OCD (obsessive compulsive disorder) 10/05/2016  . Major depressive disorder, recurrent episode, mild ([place]) 05/04/2016  . 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 Learning disability 07/18/2015  . 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 Diabetes  mellitus ([place]) 07/18/2015  . MDD (major depressive disorder), recurrent, severe, with psychosis ([place]) 07/18/2015  . Hyperlipidemia 07/18/2015  . Severe episode of recurrent major depressive disorder, without psychotic features ([place])   . Drug overdose   . Cognitive deficits 10/12/2012  . Generalized anxiety disorder 06/28/2012    Past Surgical History:  Procedure Laterality Date  . CESAREAN SECTION    . CESAREAN SECTION N/A 04/25/2013   Procedure: REPEAT CESAREAN SECTION;  Surgeon: [name], MD;  Location: [facility];  Service: Obstetrics;  Laterality: N/A;  . MASS EXCISION N/A 06/03/2012   Procedure: EXCISION MASS;  Surgeon: [name], MD;  Location: [facility];  Service: ENT;  Laterality: N/A;  Excision uvula mass  . TONSILLECTOMY N/A 06/03/2012   Procedure: TONSILLECTOMY;  Surgeon: [name], MD;  Location: [facility];  Service: ENT;  Laterality: N/A;  . TONSILLECTOMY       OB History    Gravida  3   Para  3   Term  3   Preterm  0   AB  0   Living  3     SAB  0   TAB  0   Ectopic  0   Multiple      Live Births  3            Home Medications    Prior to Admission medications   Medication Sig Start Date End Date Taking? Authorizing Provider  ABILIFY MAINTENA 400 MG PRSY prefilled syringe Inject  400 mg into the muscle every 28 (twenty-eight) days. Next dose on 05/30/2018 05/06/18  Yes Pucilowska, Jolanta B, MD  clomiPRAMINE (ANAFRANIL) 25 MG capsule Take 25 mg by mouth 2 (two) times daily as needed (nerve pain).  07/26/18  Yes [provider]  fluvoxaMINE (LUVOX) 50 MG tablet Take 50 mg by mouth at bedtime.  09/17/18  Yes [provider]  hydrochlorothiazide (HYDRODIURIL) 25 MG tablet Take 25 mg by mouth daily.   Yes [provider]  insulin detemir (LEVEMIR) 100 UNIT/ML injection Inject 40 Units into the skin 2 (two) times daily.    Yes [provider]  lithium carbonate (ESKALITH) 450  MG CR tablet Take 900 mg by mouth at bedtime.  09/16/18  Yes [provider]  propranolol (INDERAL) 10 MG tablet Take 10 mg by mouth 2 (two) times a day. 09/16/18  Yes [provider]  fluvoxaMINE (LUVOX) 100 MG tablet Take 1 tablet (100 mg total) by mouth at bedtime. Patient not taking: Reported on 10/10/2018 05/05/18   Pucilowska, Herma Ard B, MD  fluvoxaMINE (LUVOX) 100 MG tablet Take 1 tablet (100 mg total) by mouth at bedtime. Patient not taking: Reported on 10/10/2018 09/26/18   Patrecia Pour, NP  valACYclovir (VALTREX) 1000 MG tablet Take 1 tablet (1,000 mg total) by mouth daily. Patient not taking: Reported on 10/10/2018 05/24/18   Aletha Halim, MD    Family History Family History  Problem Relation Age of Onset  . Hypertension Mother   . Diabetes Father     Social History Social History   Tobacco Use  . Smoking status: Current Every Day Smoker    Packs/day: 2.00    Years: 11.00    Pack years: 22.00    Types: Cigarettes  . Smokeless tobacco: Never Used  Substance Use Topics  . Alcohol use: Never    Frequency: Never  . Drug use: Never    Comment: Patient denies     Allergies   Wellbutrin [bupropion], Omnipaque [iohexol], Penicillins, Cogentin [benztropine], Depakote er [divalproex sodium er], Divalproex sodium, and Iohexol   Review of Systems Review of Systems  Constitutional: Negative for chills and fever.  HENT: Negative for ear pain and sore throat.   Eyes: Negative for pain and visual disturbance.  Respiratory: Negative for cough and shortness of breath.   Cardiovascular: Negative for chest pain and palpitations.  Gastrointestinal: Negative for abdominal pain and vomiting.  Genitourinary: Positive for vaginal pain. Negative for dysuria, hematuria, vaginal bleeding and vaginal discharge.  Musculoskeletal: Negative for arthralgias and back pain.  Skin: Negative for color change and rash.  Neurological: Negative for seizures and syncope.  All  other systems reviewed and are negative.    Physical Exam Updated Vital Signs BP 132/82 (BP Location: Left Arm)   Pulse 73   Temp (!) 97.5 F (36.4 C)   Resp 16   Ht 5\' 7"  (1.702 m)   Wt 102.1 kg   SpO2 99%   BMI 35.24 kg/m   Physical Exam Vitals signs and nursing note reviewed.  Constitutional:      General: She is not in acute distress.    Appearance: She is well-developed.  HENT:     Head: Normocephalic and atraumatic.     Mouth/Throat:     Pharynx: No oropharyngeal exudate.  Eyes:     Pupils: Pupils are equal, round, and reactive to light.  Neck:     Musculoskeletal: Normal range of motion.  Cardiovascular:     Rate and  Rhythm: Regular rhythm.     Heart sounds: Normal heart sounds.  Pulmonary:     Effort: Pulmonary effort is normal. No respiratory distress.     Breath sounds: Normal breath sounds.  Abdominal:     General: Bowel sounds are normal. There is no distension.     Palpations: Abdomen is soft.     Tenderness: There is no abdominal tenderness.  Genitourinary:    Exam position: Supine.     Vagina: Vaginal discharge present.     Cervix: Erythema present.     Adnexa:        Right: No tenderness.         Left: No tenderness.       Comments: Chaperoned by NT. No CMT, no adnexa tenderness.  Musculoskeletal:        General: No tenderness or deformity.     Right lower leg: No edema.     Left lower leg: No edema.  Skin:    General: Skin is warm and dry.  Neurological:     Mental Status: She is alert and oriented to person, place, and time.      ED Treatments / Results  Labs (all labs ordered are listed, but only abnormal results are displayed) Labs Reviewed  WET PREP, GENITAL - Abnormal; Notable for the following components:      Result Value   WBC, Wet Prep HPF POC FEW (*)    All other components within normal limits  URINALYSIS, ROUTINE W REFLEX MICROSCOPIC - Abnormal; Notable for the following components:   APPearance HAZY (*)    All other  components within normal limits  GC/CHLAMYDIA PROBE AMP ([place]) NOT AT [facility]    EKG None  Radiology No results found.  Procedures Procedures (including critical care time)  Medications Ordered in ED Medications - No data to display   Initial Impression / Assessment and Plan / ED Course  I have reviewed the triage vital signs and the nursing notes.  Pertinent labs & imaging results that were available during my care of the patient were reviewed by me and considered in my medical decision making (see chart for details).     Patient with an extensive PMH of mental health presents to the ED with complaints of vaginal pain x 2 months. She has not tried any therapy for relieving symptoms.  Patient is nontoxic appearing, in no acute distress.  I have performed a pelvic exam with help of a nursing tech who has chaperoned his exam.  During evaluation there is no CMT, adnexal tenderness, there is thin vaginal discharge.  Patient was swabbed for gonorrhea and chlamydia along with wet prep. A urinalysis was obtained to further evaluate, patient does not report any urinary symptoms this time.  Her UA was unremarkable.  Wet prep showed few white blood cell counts, no yeast, trichomonas or clue cells present.  A gonorrhea chlamydia swab has been obtained this will be called to patient if results returned as positive.  Patient reported no abdominal pain, low suspicion for any other pathology.  Patient is a recurrent patient to the emergency department, she does have a care plan established.  She voices no other complaints to me this morning.  Patient understands she is to follow-up with her GYN for further evaluation.  No rashes, vesicles noted to the vaginal area. No further warranted need at this time.     Portions of this note were generated with Lobbyist. Dictation errors may occur despite  best attempts at proofreading.   Final Clinical Impressions(s) / ED Diagnoses    Final diagnoses:  Vaginal pain    ED Discharge Orders    None       Janeece Fitting, Hershal Coria 10/10/18 0933    Lacretia Leigh, MD 10/12/18 1537

## 2018-10-10 NOTE — H&P (Signed)
Behavioral Health Medical Screening Exam  [name] is an 28 year old African-American female presented as a behavioral walk-in with suicidal plan and intent to overdose if she does not get admitted.  She reports taking and tolerating medications as prescribed by her primary care provider.  Patient reports she is followed by [facility] and was recently evaluated today.  States worsening paranoia " I get really paranoid when I read the bus."  Reported feeling her medications are not working.  Patient multiple states recent break-up with a boyfriend who is on and off.  Reported she is attempting to break it completely off with him but has not been successful.  Chart review previous inpatient admissions.  Patient reported attempted overdose x2 weeks ago.  Support encouragement reassurance was provided.  Total Time spent with patient: 15 minutes  Psychiatric Specialty Exam: Physical Exam  Vitals reviewed. Constitutional: She appears well-developed.  Cardiovascular: Normal rate.  Psychiatric: She has a normal mood and affect. Her behavior is normal.    ROS  Blood pressure 127/74, pulse 68, temperature 98 F (36.7 C), resp. rate 16.There is no height or weight on file to calculate BMI.  General Appearance: Casual  Eye Contact:  Good  Speech:  Clear and Coherent  Volume:  Normal  Mood:  Anxious, Depressed and Irritable  Affect:  Congruent  Thought Process:  Coherent  Orientation:  Full (Time, Place, and Person)  Thought Content:  Logical  Suicidal Thoughts:  Yes.  with intent/plan  Homicidal Thoughts:  No  Memory:  Immediate;   Fair Recent;   Fair  Judgement:  Fair  Insight:  Fair  Psychomotor Activity:  Normal  Concentration: Concentration: Fair  Recall:  [employer] of Knowledge:Fair  Language: Fair  Akathisia:  No  Handed:  Right  AIMS (if indicated):     Assets:  Communication Skills Desire for Improvement Resilience Social Support  Sleep:       Musculoskeletal: Strength &  Muscle Tone: within normal limits Gait & Station: normal Patient leans: N/A  Blood pressure 127/74, pulse 68, temperature 98 F (36.7 C), resp. rate 16.  Recommendations: Observation unit Based on my evaluation the patient does not appear to have an emergency medical condition.  Derrill Center, NP 10/10/2018, 3:47 PM

## 2018-10-11 ENCOUNTER — Encounter (HOSPITAL_COMMUNITY): Payer: Self-pay | Admitting: Registered Nurse

## 2018-10-11 DIAGNOSIS — F4325 Adjustment disorder with mixed disturbance of emotions and conduct: Secondary | ICD-10-CM

## 2018-10-11 LAB — GLUCOSE, CAPILLARY: Glucose-Capillary: 89 mg/dL (ref 70–99)

## 2018-10-11 LAB — CBC
HCT: 37.2 % (ref 36.0–46.0)
Hemoglobin: 11.7 g/dL — ABNORMAL LOW (ref 12.0–15.0)
MCH: 27.3 pg (ref 26.0–34.0)
MCHC: 31.5 g/dL (ref 30.0–36.0)
MCV: 86.9 fL (ref 80.0–100.0)
Platelets: 253 10*3/uL (ref 150–400)
RBC: 4.28 MIL/uL (ref 3.87–5.11)
RDW: 13.5 % (ref 11.5–15.5)
WBC: 9.1 10*3/uL (ref 4.0–10.5)
nRBC: 0 % (ref 0.0–0.2)

## 2018-10-11 LAB — COMPREHENSIVE METABOLIC PANEL
ALT: 17 U/L (ref 0–44)
AST: 19 U/L (ref 15–41)
Albumin: 3.9 g/dL (ref 3.5–5.0)
Alkaline Phosphatase: 31 U/L — ABNORMAL LOW (ref 38–126)
Anion gap: 11 (ref 5–15)
BUN: 12 mg/dL (ref 6–20)
CO2: 28 mmol/L (ref 22–32)
Calcium: 8.8 mg/dL — ABNORMAL LOW (ref 8.9–10.3)
Chloride: 102 mmol/L (ref 98–111)
Creatinine, Ser: 0.71 mg/dL (ref 0.44–1.00)
GFR calc Af Amer: >60 mL/min (ref >60)
GFR calc non Af Amer: >60 mL/min (ref >60)
Glucose, Bld: 141 mg/dL — ABNORMAL HIGH (ref 70–99)
Potassium: 3 mmol/L — ABNORMAL LOW (ref 3.5–5.1)
Sodium: 141 mmol/L (ref 135–145)
Total Bilirubin: 0.9 mg/dL (ref 0.3–1.2)
Total Protein: 7.1 g/dL (ref 6.5–8.1)

## 2018-10-11 LAB — LIPID PANEL
Cholesterol: 195 mg/dL (ref 0–200)
HDL: 49 mg/dL (ref >40)
Total CHOL/HDL Ratio: 4 RATIO
Triglycerides: 127 mg/dL (ref <150)
VLDL: 25 mg/dL (ref 0–40)

## 2018-10-11 LAB — HEMOGLOBIN A1C
Hgb A1c MFr Bld: 5.4 % (ref 4.8–5.6)
Mean Plasma Glucose: 108.28 mg/dL

## 2018-10-11 LAB — TSH: TSH: 1.225 u[IU]/mL (ref 0.350–4.500)

## 2018-10-11 MED ORDER — LORAZEPAM 1 MG PO TABS
1.0000 mg | ORAL_TABLET | Freq: Once | ORAL | Status: AC
  Administered 2018-10-11: 1 mg via ORAL
  Filled 2018-10-11: qty 2

## 2018-10-11 MED ORDER — LORAZEPAM 2 MG/ML IJ SOLN
2.0000 mg | Freq: Once | INTRAMUSCULAR | Status: AC
  Administered 2018-10-11: 2 mg via INTRAMUSCULAR
  Filled 2018-10-11: qty 1

## 2018-10-11 MED ORDER — HYDROXYZINE HCL 25 MG PO TABS: 25.0000 mg | ORAL_TABLET | Freq: Three times a day (TID) | ORAL | Status: DC | PRN

## 2018-10-11 NOTE — Discharge Summary (Addendum)
[facility] Psych Observation Discharge  10/11/2018 10:58 AM [name]  MRN:  [id number] Principal Problem: Adjustment disorder with mixed disturbance of emotions and conduct Discharge Diagnoses: Principal Problem:   Adjustment disorder with mixed disturbance of emotions and conduct Active Problems:   MDD (major depressive disorder)   Subjective:  Patient states that she came in because she doesn't like it at her house and want to go to a group home.  Patient states that her boyfriend is living with her at this time and she doesn't want him to.  "I hope he is gone and don't come back"  Patient states that ACTT is in the process of helping her find a group home but it is taking too long.   Patient seen face to face by this provider, Dr. Mariea Clonts, and chart reviewed on 10/11/18.  On evaluation [name] reports that her biggest stressor is having her boyfriend in the house with her now and wanting him to leave.  States that the ACTT team is aware that her boyfriend is living with her.  Patient states another stressor is getting into a group home.  States that it is taking too long.  Patient denies suicidal/self-harm/homicidal ideation, psychosis, and paranoia.  Patient states that she wants to find a group home outside of [place]; in [place] or [place].     During evaluation [name] is laying in bed on her side; she is alert/oriented x 4; calm/cooperative; and mood congruent with affect.  Patient is speaking in a clear tone at moderate volume, and normal pace; with good eye contact.  Her thought process is coherent and relevant; There is no indication that she is currently responding to internal/external stimuli or experiencing delusional thought content.  Patient denies suicidal/self-harm/homicidal ideation, psychosis, and paranoia.  Patient has remained calm throughout assessment and has answered questions appropriately.    Total Time spent with patient: 30 minutes  Past  Psychiatric History: Schizoaffective disorder, bipolar type, OCD, MDD  Past Medical History:  Past Medical History:  Diagnosis Date  . Anxiety   . Bipolar 1 disorder ([place])   . Cognitive deficits   . Depression   . Diabetes mellitus without complication ([place])   . Hypertension   . Mental disorder   . Mental health disorder   . Obesity     Past Surgical History:  Procedure Laterality Date  . CESAREAN SECTION    . CESAREAN SECTION N/A 04/25/2013   Procedure: REPEAT CESAREAN SECTION;  Surgeon: [name], MD;  Location: [facility];  Service: Obstetrics;  Laterality: N/A;  . MASS EXCISION N/A 06/03/2012   Procedure: EXCISION MASS;  Surgeon: [name], MD;  Location: [facility];  Service: ENT;  Laterality: N/A;  Excision uvula mass  . TONSILLECTOMY N/A 06/03/2012   Procedure: TONSILLECTOMY;  Surgeon: [name], MD;  Location: [facility];  Service: ENT;  Laterality: N/A;  . TONSILLECTOMY     Family History:  Family History  Problem Relation Age of Onset  . Hypertension Mother   . Diabetes Father    Family Psychiatric  History: None per chart review.   Social History:  Social History   Substance and Sexual Activity  Alcohol Use Never  . Frequency: Never     Social History   Substance and Sexual Activity  Drug Use Never   Comment: Patient denies    Social History   Socioeconomic History  . Marital status: Single    Spouse name: Not  on file  . Number of children: Not on file  . Years of education: Not on file  . Highest education level: Not on file  Occupational History  . Not on file  Social Needs  . Financial resource strain: Not on file  . Food insecurity    Worry: Not on file    Inability: Not on file  . Transportation needs    Medical: Not on file    Non-medical: Not on file  Tobacco Use  . Smoking status: Current Every Day Smoker    Packs/day: 2.00    Years: 11.00    Pack years: 22.00    Types: Cigarettes  .  Smokeless tobacco: Never Used  Substance and Sexual Activity  . Alcohol use: Never    Frequency: Never  . Drug use: Never    Comment: Patient denies  . Sexual activity: Yes    Birth control/protection: Implant  Lifestyle  . Physical activity    Days per week: Not on file    Minutes per session: Not on file  . Stress: Not on file  Relationships  . Social Herbalist on phone: Not on file    Gets together: Not on file    Attends religious service: Not on file    Active member of club or organization: Not on file    Attends meetings of clubs or organizations: Not on file    Relationship status: Not on file  Other Topics Concern  . Not on file  Social History Narrative   ** Merged History Encounter **        Has this patient used any form of tobacco in the last 30 days? (Cigarettes, Smokeless Tobacco, Cigars, and/or Pipes) A prescription for an FDA-approved tobacco cessation medication was offered at discharge and the patient refused  Current Medications: Current Facility-Administered Medications  Medication Dose Route Frequency Provider Last Rate Last Dose  . acetaminophen (TYLENOL) tablet 650 mg  650 mg Oral Q6H PRN Derrill Center, NP      . alum & mag hydroxide-simeth (MAALOX/MYLANTA) 200-200-20 MG/5ML suspension 30 mL  30 mL Oral Q4H PRN Derrill Center, NP      . fluvoxaMINE (LUVOX) tablet 50 mg  50 mg Oral QHS Derrill Center, NP   50 mg at 10/10/18 2208  . hydrOXYzine (ATARAX/VISTARIL) tablet 25 mg  25 mg Oral TID PRN Lindon Romp A, NP      . insulin detemir (LEVEMIR) injection 40 Units  40 Units Subcutaneous QHS Derrill Center, NP   40 Units at 10/10/18 2324  . magnesium hydroxide (MILK OF MAGNESIA) suspension 30 mL  30 mL Oral Daily PRN Derrill Center, NP      . propranolol (INDERAL) tablet 10 mg  10 mg Oral BID Derrill Center, NP   10 mg at 10/10/18 1849  . traZODone (DESYREL) tablet 50 mg  50 mg Oral QHS PRN Derrill Center, NP       PTA  Medications: Medications Prior to Admission  Medication Sig Dispense Refill Last Dose  . ABILIFY MAINTENA 400 MG PRSY prefilled syringe Inject 400 mg into the muscle every 28 (twenty-eight) days. Next dose on 05/30/2018 1 each 6   . clomiPRAMINE (ANAFRANIL) 25 MG capsule Take 25 mg by mouth 2 (two) times daily as needed (nerve pain).      . fluvoxaMINE (LUVOX) 100 MG tablet Take 1 tablet (100 mg total) by mouth at bedtime. (Patient not taking: Reported  on 10/10/2018) 60 tablet 1   . fluvoxaMINE (LUVOX) 100 MG tablet Take 1 tablet (100 mg total) by mouth at bedtime. (Patient not taking: Reported on 10/10/2018) 150 tablet 0   . fluvoxaMINE (LUVOX) 50 MG tablet Take 50 mg by mouth at bedtime.      . hydrochlorothiazide (HYDRODIURIL) 25 MG tablet Take 25 mg by mouth daily.     . insulin detemir (LEVEMIR) 100 UNIT/ML injection Inject 40 Units into the skin 2 (two) times daily.      Marland Kitchen lithium carbonate (ESKALITH) 450 MG CR tablet Take 900 mg by mouth at bedtime.      . propranolol (INDERAL) 10 MG tablet Take 10 mg by mouth 2 (two) times a day.     . valACYclovir (VALTREX) 1000 MG tablet Take 1 tablet (1,000 mg total) by mouth daily. (Patient not taking: Reported on 10/10/2018) 20 tablet 1     Musculoskeletal: Strength & Muscle Tone: within normal limits Gait & Station: normal Patient leans: N/A  Psychiatric Specialty Exam: Physical Exam  Review of Systems  Psychiatric/Behavioral: Negative for depression and suicidal ideas. Hallucinations: Denies at this time.  All other systems reviewed and are negative.   Blood pressure 127/74, pulse 68, temperature 98 F (36.7 C), resp. rate 16.There is no height or weight on file to calculate BMI.  General Appearance: Casual  Eye Contact:  Good  Speech:  Clear and Coherent and Normal Rate  Volume:  Normal  Mood:  Appropriate  Affect:  Appropriate and Congruent  Thought Process:  Coherent and Descriptions of Associations: Intact  Orientation:  Full (Time,  Place, and Person)  Thought Content:  WDL  Suicidal Thoughts:  No  Homicidal Thoughts:  No  Memory:  Immediate;   Good Recent;   Good Remote;   Good  Judgement:  Fair  Insight:  Fair  Psychomotor Activity:  Normal  Concentration:  Concentration: Fair  Recall:  Good  Fund of Knowledge:  Fair  Language:  Fair  Akathisia:  No  Handed:  Right  AIMS (if indicated):   N/A  Assets:  Communication Skills Desire for Improvement Housing Physical Health  ADL's:  Intact  Cognition:  WNL  Sleep:   N/A     Demographic Factors:  NA  Loss Factors: NA  Historical Factors: Impulsivity  Risk Reduction Factors:   Religious beliefs about death and Positive therapeutic relationship  Continued Clinical Symptoms:  Previous Psychiatric Diagnoses and Treatments  Cognitive Features That Contribute To Risk:  None    Suicide Risk:  Minimal: No identifiable suicidal ideation.  Patients presenting with no risk factors but with morbid ruminations; may be classified as minimal risk based on the severity of the depressive symptoms    Plan Of Care/Follow-up recommendations:  Activity:  As tolerated Diet:  Heart Healthy Other:  Follow up with ACTT team  Disposition: No evidence of imminent risk to self or others at present.   Patient does not meet criteria for psychiatric inpatient admission. Supportive therapy provided about ongoing stressors. Discussed crisis plan, support from social network, calling 911, coming to the Emergency Department, and calling Suicide Hotline.    [name], NP 10/11/2018, 10:58 AM   Patient seen face-to-face for psychiatric evaluation, chart reviewed and case discussed with the physician extender and developed treatment plan. Reviewed the information documented and agree with the treatment plan.  Buford Dresser, DO 10/11/18 3:43 PM

## 2018-10-11 NOTE — Progress Notes (Signed)
Pt anxious and slightly agitated, banging on the door and snapping her fingers.  NP Lindon Romp notified.  Ativan 1 mg given po for anxiety.

## 2018-10-11 NOTE — Discharge Instructions (Signed)
For your behavioral health needs, you are advised to continue treatment with the [facility] Team: ° °     [facility] ACT Team °     [facility], [address] °     [address] °     [place],   [address] °     [phone] °     Crisis number: [phone] °

## 2018-10-11 NOTE — Progress Notes (Signed)
Patient ID: [name], female   DOB: [birth year], 28 y.o.   MRN: [id number] Patient discharged from home/self care on her own accord.  Patient denies SI, HI and AVH upon discharge.  Patient acknowledged receipt of personal belonging and understanding of discharge instructions.

## 2018-10-11 NOTE — BH Assessment (Signed)
[facility] Assessment Progress Note  Per Buford Dresser, DO, this pt does not require psychiatric hospitalization at this time.  Pt is to be discharged from [facility] with recommendation to continue treatment with the [facility].  This has been included in pt's discharge instructions.  At 10:20 this Probation officer called [facility] and spoke to [place], notifying her of pt's disposition.  Pt's nurse, Edd Arbour, has been notified.  Jalene Mullet, [place] Triage Specialist [phone]

## 2018-10-11 NOTE — Progress Notes (Signed)
Pt now pacing and slamming doors, increased agitation noted.

## 2018-10-12 LAB — GC/CHLAMYDIA PROBE AMP (~~LOC~~) NOT AT ARMC
Chlamydia: NEGATIVE
Neisseria Gonorrhea: NEGATIVE

## 2018-10-12 LAB — PROLACTIN: Prolactin: 5.2 ng/mL (ref 4.8–23.3)

## 2018-10-23 ENCOUNTER — Encounter (HOSPITAL_COMMUNITY): Payer: Self-pay

## 2018-10-23 ENCOUNTER — Emergency Department (HOSPITAL_COMMUNITY): Payer: Medicaid Other

## 2018-10-23 ENCOUNTER — Emergency Department (HOSPITAL_COMMUNITY)
Admission: EM | Admit: 2018-10-23 | Discharge: 2018-10-23 | Disposition: A | Discharge status: Home / Self Care | Payer: Medicaid Other | Attending: Emergency Medicine | Admitting: Emergency Medicine

## 2018-10-23 ENCOUNTER — Other Ambulatory Visit: Payer: Self-pay

## 2018-10-23 DIAGNOSIS — E876 Hypokalemia: Secondary | ICD-10-CM | POA: Insufficient documentation

## 2018-10-23 DIAGNOSIS — I10 Essential (primary) hypertension: Secondary | ICD-10-CM | POA: Insufficient documentation

## 2018-10-23 DIAGNOSIS — Z79899 Other long term (current) drug therapy: Secondary | ICD-10-CM | POA: Insufficient documentation

## 2018-10-23 DIAGNOSIS — E119 Type 2 diabetes mellitus without complications: Secondary | ICD-10-CM | POA: Insufficient documentation

## 2018-10-23 DIAGNOSIS — F17210 Nicotine dependence, cigarettes, uncomplicated: Secondary | ICD-10-CM | POA: Insufficient documentation

## 2018-10-23 DIAGNOSIS — F1490 Cocaine use, unspecified, uncomplicated: Secondary | ICD-10-CM | POA: Diagnosis not present

## 2018-10-23 DIAGNOSIS — R42 Dizziness and giddiness: Secondary | ICD-10-CM | POA: Diagnosis present

## 2018-10-23 LAB — RAPID URINE DRUG SCREEN, HOSP PERFORMED
Amphetamines: NOT DETECTED
Barbiturates: NOT DETECTED
Benzodiazepines: NOT DETECTED
Cocaine: POSITIVE — AB
Opiates: NOT DETECTED
Tetrahydrocannabinol: NOT DETECTED

## 2018-10-23 LAB — URINALYSIS, ROUTINE W REFLEX MICROSCOPIC
Bilirubin Urine: NEGATIVE
Glucose, UA: NEGATIVE mg/dL
Hgb urine dipstick: NEGATIVE
Ketones, ur: NEGATIVE mg/dL
Leukocytes,Ua: NEGATIVE
Nitrite: NEGATIVE
Protein, ur: NEGATIVE mg/dL
Specific Gravity, Urine: 1.025 (ref 1.005–1.030)
pH: 6 (ref 5.0–8.0)

## 2018-10-23 LAB — CBC WITH DIFFERENTIAL/PLATELET
Abs Immature Granulocytes: 0.04 10*3/uL (ref 0.00–0.07)
Basophils Absolute: 0.1 10*3/uL (ref 0.0–0.1)
Basophils Relative: 1 %
Eosinophils Absolute: 0.4 10*3/uL (ref 0.0–0.5)
Eosinophils Relative: 4 %
HCT: 41 % (ref 36.0–46.0)
Hemoglobin: 12.9 g/dL (ref 12.0–15.0)
Immature Granulocytes: 0 %
Lymphocytes Relative: 23 %
Lymphs Abs: 2.5 10*3/uL (ref 0.7–4.0)
MCH: 26.5 pg (ref 26.0–34.0)
MCHC: 31.5 g/dL (ref 30.0–36.0)
MCV: 84.4 fL (ref 80.0–100.0)
Monocytes Absolute: 0.6 10*3/uL (ref 0.1–1.0)
Monocytes Relative: 6 %
Neutro Abs: 7.2 10*3/uL (ref 1.7–7.7)
Neutrophils Relative %: 66 %
Platelets: 235 10*3/uL (ref 150–400)
RBC: 4.86 MIL/uL (ref 3.87–5.11)
RDW: 13.3 % (ref 11.5–15.5)
WBC: 10.7 10*3/uL — ABNORMAL HIGH (ref 4.0–10.5)
nRBC: 0 % (ref 0.0–0.2)

## 2018-10-23 LAB — LITHIUM LEVEL: Lithium Lvl: 0.62 mmol/L (ref 0.60–1.20)

## 2018-10-23 LAB — ETHANOL: Alcohol, Ethyl (B): <10 mg/dL (ref <10)

## 2018-10-23 LAB — COMPREHENSIVE METABOLIC PANEL
ALT: 16 U/L (ref 0–44)
AST: 20 U/L (ref 15–41)
Albumin: 3.8 g/dL (ref 3.5–5.0)
Alkaline Phosphatase: 36 U/L — ABNORMAL LOW (ref 38–126)
Anion gap: 8 (ref 5–15)
BUN: 12 mg/dL (ref 6–20)
CO2: 28 mmol/L (ref 22–32)
Calcium: 9.6 mg/dL (ref 8.9–10.3)
Chloride: 102 mmol/L (ref 98–111)
Creatinine, Ser: 0.79 mg/dL (ref 0.44–1.00)
GFR calc Af Amer: >60 mL/min (ref >60)
GFR calc non Af Amer: >60 mL/min (ref >60)
Glucose, Bld: 126 mg/dL — ABNORMAL HIGH (ref 70–99)
Potassium: 2.9 mmol/L — ABNORMAL LOW (ref 3.5–5.1)
Sodium: 138 mmol/L (ref 135–145)
Total Bilirubin: 0.8 mg/dL (ref 0.3–1.2)
Total Protein: 7.2 g/dL (ref 6.5–8.1)

## 2018-10-23 LAB — PREGNANCY, URINE: Preg Test, Ur: NEGATIVE

## 2018-10-23 LAB — MAGNESIUM: Magnesium: 2 mg/dL (ref 1.7–2.4)

## 2018-10-23 MED ORDER — POTASSIUM CHLORIDE ER 10 MEQ PO TBCR: 10.0000 meq | EXTENDED_RELEASE_TABLET | Freq: Every day | ORAL | 0 refills | Status: DC

## 2018-10-23 MED ORDER — POTASSIUM CHLORIDE 10 MEQ/100ML IV SOLN
10.0000 meq | Freq: Once | INTRAVENOUS | Status: AC
  Administered 2018-10-23: 10 meq via INTRAVENOUS
  Filled 2018-10-23: qty 100

## 2018-10-23 MED ORDER — LORAZEPAM 1 MG PO TABS
1.0000 mg | ORAL_TABLET | Freq: Once | ORAL | Status: AC
  Administered 2018-10-23: 1 mg via ORAL
  Filled 2018-10-23: qty 1

## 2018-10-23 MED ORDER — MAGNESIUM SULFATE 2 GM/50ML IV SOLN
2.0000 g | INTRAVENOUS | Status: AC
  Administered 2018-10-23: 2 g via INTRAVENOUS
  Filled 2018-10-23: qty 50

## 2018-10-23 NOTE — ED Provider Notes (Signed)
[facility] EMERGENCY DEPARTMENT Provider Note   CSN: [id number] Arrival date & time: 10/23/18  1216    History   Chief Complaint Chief Complaint  Patient presents with   Light headed    HPI [name] is a 28 y.o. female.     HPI  The pt has been smoking crack cocaine for he last few days - multiple times per day =- woke up with light headedness today with fatigue.  She has had food and water this morning without difficulty.  Sx are persistent, onset this morning, nothing makes better or worse, no associated cough, fever, weakness (focal), no palpitations, no ETOH use - no abd pain / dysuria, diarrhea / rectal bleeding.  Had mild nausea but no v omiting.  Past Medical History:  Diagnosis Date   Anxiety    Bipolar 1 disorder ([place])    Cognitive deficits    Depression    Diabetes mellitus without complication ([place])    Hypertension    Mental disorder    Mental health disorder    Obesity     Patient Active Problem List   Diagnosis Date Noted   MDD (major depressive disorder) 10/10/2018   Schizoaffective disorder, bipolar type ([place]) 09/25/2018   Bipolar 1 disorder ([place]) 06/13/2018   HTN (hypertension) 05/03/2018   Tobacco use disorder 05/03/2018   Bipolar I disorder, most recent episode depressed, severe without psychotic features ([place]) 05/02/2018   Adjustment disorder with emotional disturbance 01/02/2018   Schizophrenia, 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   Moderate bipolar I disorder, most recent episode depressed ([place])    Psychosis ([place])    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/2017   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   OCD (obsessive compulsive disorder) 10/05/2016   Major depressive disorder, recurrent episode, mild ([place]) 05/04/2016   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   Learning disability 07/18/2015   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/2015   Diabetes mellitus ([place])  07/18/2015   MDD (major depressive disorder), recurrent, severe, 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   Hyperlipidemia 07/18/2015   Severe episode of recurrent major depressive disorder, without psychotic features ([place])    Drug overdose    Cognitive deficits 10/12/2012   Generalized anxiety disorder 06/28/2012    Past Surgical History:  Procedure Laterality Date   CESAREAN SECTION     CESAREAN SECTION N/A 04/25/2013   Procedure: REPEAT CESAREAN SECTION;  Surgeon: [name], MD;  Location: [facility];  Service: Obstetrics;  Laterality: N/A;   MASS EXCISION N/A 06/03/2012   Procedure: EXCISION MASS;  Surgeon: [name], MD;  Location: [facility];  Service: ENT;  Laterality: N/A;  Excision uvula mass   TONSILLECTOMY N/A 06/03/2012   Procedure: TONSILLECTOMY;  Surgeon: [name], MD;  Location: [facility];  Service: ENT;  Laterality: N/A;   TONSILLECTOMY       OB History    Gravida  3   Para  3   Term  3   Preterm  0   AB  0   Living  3     SAB  0   TAB  0   Ectopic  0   Multiple      Live Births  3            Home Medications    Prior to Admission medications   Medication Sig Start Date End Date Taking? Authorizing Provider  ABILIFY MAINTENA 400 MG PRSY prefilled syringe Inject 400 mg into the muscle every  28 (twenty-eight) days. Next dose on 05/30/2018 05/06/18   Pucilowska, Herma Ard B, MD  clomiPRAMINE (ANAFRANIL) 25 MG capsule Take 25 mg by mouth 2 (two) times daily as needed (nerve pain).  07/26/18   [provider]  fluvoxaMINE (LUVOX) 100 MG tablet Take 1 tablet (100 mg total) by mouth at bedtime. Patient not taking: Reported on 10/10/2018 05/05/18   Pucilowska, Herma Ard B, MD  hydrochlorothiazide (HYDRODIURIL) 25 MG tablet Take 25 mg by mouth daily.    [provider]  insulin detemir (LEVEMIR) 100 UNIT/ML injection Inject 40 Units into the skin 2 (two) times daily.     [provider]    lithium carbonate (ESKALITH) 450 MG CR tablet Take 900 mg by mouth at bedtime.  09/16/18   [provider]  potassium chloride (K-DUR) 10 MEQ tablet Take 1 tablet (10 mEq total) by mouth daily. 10/23/18   Noemi Chapel, MD  propranolol (INDERAL) 10 MG tablet Take 10 mg by mouth 2 (two) times a day. 09/16/18   [provider]  valACYclovir (VALTREX) 1000 MG tablet Take 1 tablet (1,000 mg total) by mouth daily. Patient not taking: Reported on 10/10/2018 05/24/18   Aletha Halim, MD    Family History Family History  Problem Relation Age of Onset   Hypertension Mother    Diabetes Father     Social History Social History   Tobacco Use   Smoking status: Current Every Day Smoker    Packs/day: 2.00    Years: 11.00    Pack years: 22.00    Types: Cigarettes   Smokeless tobacco: Never Used  Substance Use Topics   Alcohol use: Never    Frequency: Never   Drug use: Never    Comment: Patient denies     Allergies   Wellbutrin [bupropion], Omnipaque [iohexol], Penicillins, Cogentin [benztropine], Depakote er [divalproex sodium er], Divalproex sodium, and Iohexol   Review of Systems Review of Systems  All other systems reviewed and are negative.    Physical Exam Updated Vital Signs BP 140/86    Pulse 77    Temp 98.3 F (36.8 C) (Oral)    Resp 16    LMP  (LMP Unknown)    SpO2 100%   Physical Exam Vitals signs and nursing note reviewed.  Constitutional:      General: She is not in acute distress.    Appearance: She is well-developed.  HENT:     Head: Normocephalic and atraumatic.     Mouth/Throat:     Pharynx: No oropharyngeal exudate.  Eyes:     General: No scleral icterus.       Right eye: No discharge.        Left eye: No discharge.     Conjunctiva/sclera: Conjunctivae normal.     Pupils: Pupils are equal, round, and reactive to light.  Neck:     Musculoskeletal: Normal range of motion and neck supple.     Thyroid: No thyromegaly.     Vascular:  No JVD.  Cardiovascular:     Rate and Rhythm: Normal rate and regular rhythm.     Heart sounds: Normal heart sounds. No murmur. No friction rub. No gallop.   Pulmonary:     Effort: Pulmonary effort is normal. No respiratory distress.     Breath sounds: Normal breath sounds. No wheezing or rales.  Abdominal:     General: Bowel sounds are normal. There is no distension.     Palpations: Abdomen is soft. There is no mass.  Tenderness: There is no abdominal tenderness.  Musculoskeletal: Normal range of motion.        General: No tenderness.  Lymphadenopathy:     Cervical: No cervical adenopathy.  Skin:    General: Skin is warm and dry.     Findings: No erythema or rash.  Neurological:     Mental Status: She is alert.     Coordination: Coordination normal.     Comments: Neurologic exam:  Speech clear, pupils equal round reactive to light, extraocular movements intact  Normal peripheral visual fields Cranial nerves III through XII normal including no facial droop Follows commands, moves all extremities x4, normal strength to bilateral upper and lower extremities at all major muscle groups including grip Sensation normal to light touch and pinprick Coordination intact, no limb ataxia, finger-nose-finger normal, heel shin normal bilaterally Rapid alternating movements normal No pronator drift Gait normal Can heal and toe walk without weakness.   Psychiatric:        Behavior: Behavior normal.      ED Treatments / Results  Labs (all labs ordered are listed, but only abnormal results are displayed) Labs Reviewed  CBC WITH DIFFERENTIAL/PLATELET - Abnormal; Notable for the following components:      Result Value   WBC 10.7 (*)    All other components within normal limits  COMPREHENSIVE METABOLIC PANEL - Abnormal; Notable for the following components:   Potassium 2.9 (*)    Glucose, Bld 126 (*)    Alkaline Phosphatase 36 (*)    All other components within normal limits  RAPID  URINE DRUG SCREEN, HOSP PERFORMED - Abnormal; Notable for the following components:   Cocaine POSITIVE (*)    All other components within normal limits  ETHANOL  MAGNESIUM  URINALYSIS, ROUTINE W REFLEX MICROSCOPIC  PREGNANCY, URINE  LITHIUM LEVEL    EKG EKG Interpretation  Date/Time:  Sunday October 23 2018 12:24:37 EDT Ventricular Rate:  70 PR Interval:    QRS Duration: 93 QT Interval:  413 QTC Calculation: 446 R Axis:   74 Text Interpretation:  Sinus rhythm Borderline Q waves in lateral leads since last tracing no significant change Confirmed by Noemi Chapel [phone]) on 10/23/2018 2:17:41 PM   Radiology Ct Head Wo Contrast  Result Date: 10/23/2018 CLINICAL DATA:  Headache and dizziness EXAM: CT HEAD WITHOUT CONTRAST TECHNIQUE: Contiguous axial images were obtained from the base of the skull through the vertex without intravenous contrast. COMPARISON:  December 08, 2015. FINDINGS: Brain: Ventricles are normal in size and configuration. There is no intracranial mass, hemorrhage, extra-axial fluid collection, or midline shift. Brain parenchyma appears unremarkable. There is no evident acute infarct. Vascular: There is no hyperdense vessel. There is no vascular calcification appreciable. Skull: The bony calvarium appears intact. Sinuses/Orbits: There is mucosal thickening in the right maxillary antrum. There are retention cysts in each maxillary antrum. There is mucosal thickening with apparent retention cysts in the medial right sphenoid sinus region. There is opacification of multiple ethmoid air cells bilaterally. There is mucosal thickening in the right frontal sinus. Orbits appear symmetric bilaterally. Other: Visualized mastoid air cells are clear. IMPRESSION: Multifocal paranasal sinus disease.  Study otherwise unremarkable. Electronically Signed   By: Lowella Grip III M.D.   On: 10/23/2018 13:31    Procedures Procedures (including critical care time)  Medications Ordered in  ED Medications  LORazepam (ATIVAN) tablet 1 mg (1 mg Oral Given 10/23/18 1312)  potassium chloride 10 mEq in 100 mL IVPB (10 mEq Intravenous New Bag/Given 10/23/18  1515)  magnesium sulfate IVPB 2 g 50 mL (2 g Intravenous New Bag/Given 10/23/18 1511)     Initial Impression / Assessment and Plan / ED Course  I have reviewed the triage vital signs and the nursing notes.  Pertinent labs & imaging results that were available during my care of the patient were reviewed by me and considered in my medical decision making (see chart for details).  Clinical Course as of Oct 23 1619  Sun Oct 23, 2018  1415 Labs thus far are unremarkable except for the CT scan which shows some paranasal sinus disease.  She also has a urine pregnancy which is negative, urinalysis which shows no significant dehydration, glucosuria, ketonuria or infection, she does have some hypokalemia which will be replaced here.  Lithium level is therapeutic, she has not have any alcohol in her system and she is not anemic.  Drug screen pending   [BM]    Clinical Course User Index [BM] Noemi Chapel, MD       The patient did very well, she received magnesium and potassium and is stable for discharge, she is expressed her understanding to the indications for return.  She does not appear to have any significant or severe life-threatening events that would need further stabilizing care.  Final Clinical Impressions(s) / ED Diagnoses   Final diagnoses:  Crack cocaine use  Light headed  Hypokalemia    ED Discharge Orders         Ordered    potassium chloride (K-DUR) 10 MEQ tablet  Daily     10/23/18 1505           Noemi Chapel, MD 10/23/18 1621

## 2018-10-23 NOTE — ED Notes (Signed)
Pt reminded of the need for urine.  

## 2018-10-23 NOTE — ED Notes (Signed)
Patient verbalizes understanding of discharge instructions. Opportunity for questioning and answers were provided. Armband removed by staff, pt discharged from ED.  

## 2018-10-23 NOTE — Discharge Instructions (Signed)
Your testing today does not show any signs of heart attack - but you did have low potassium - this means that you will need to take the medicine that I have prescribed over the next 5 days to replace it.  Please see your doctor to have the level rechecked in a week.    Please stop using crack - this may cause heart attack / stroke or other long term problems.  ER for any severe or worsening problems / symptoms or concerns

## 2018-10-23 NOTE — ED Notes (Signed)
Patient transported to CT 

## 2018-10-23 NOTE — ED Triage Notes (Signed)
EMS reports pt feeling lightheaded after 3-4 days of crack cocaine use. Pt V/S stable cbg 141.

## 2018-11-03 ENCOUNTER — Other Ambulatory Visit: Payer: Self-pay

## 2018-11-03 ENCOUNTER — Emergency Department
Admission: EM | Admit: 2018-11-03 | Discharge: 2018-11-03 | Disposition: A | Discharge status: Home / Self Care | Payer: Medicaid Other | Attending: Emergency Medicine | Admitting: Emergency Medicine

## 2018-11-03 DIAGNOSIS — R112 Nausea with vomiting, unspecified: Secondary | ICD-10-CM | POA: Diagnosis present

## 2018-11-03 DIAGNOSIS — F17210 Nicotine dependence, cigarettes, uncomplicated: Secondary | ICD-10-CM | POA: Diagnosis not present

## 2018-11-03 DIAGNOSIS — I10 Essential (primary) hypertension: Secondary | ICD-10-CM | POA: Insufficient documentation

## 2018-11-03 DIAGNOSIS — Z79899 Other long term (current) drug therapy: Secondary | ICD-10-CM | POA: Insufficient documentation

## 2018-11-03 DIAGNOSIS — E119 Type 2 diabetes mellitus without complications: Secondary | ICD-10-CM | POA: Diagnosis not present

## 2018-11-03 DIAGNOSIS — Z794 Long term (current) use of insulin: Secondary | ICD-10-CM | POA: Insufficient documentation

## 2018-11-03 LAB — GLUCOSE, CAPILLARY: Glucose-Capillary: 116 mg/dL — ABNORMAL HIGH (ref 70–99)

## 2018-11-03 LAB — URINALYSIS, COMPLETE (UACMP) WITH MICROSCOPIC
Bacteria, UA: NONE SEEN
Bilirubin Urine: NEGATIVE
Glucose, UA: NEGATIVE mg/dL
Hgb urine dipstick: NEGATIVE
Ketones, ur: NEGATIVE mg/dL
Leukocytes,Ua: NEGATIVE
Nitrite: NEGATIVE
Protein, ur: NEGATIVE mg/dL
Specific Gravity, Urine: 1.016 (ref 1.005–1.030)
pH: 6 (ref 5.0–8.0)

## 2018-11-03 LAB — URINE DRUG SCREEN, QUALITATIVE (ARMC ONLY)
Amphetamines, Ur Screen: NOT DETECTED
Barbiturates, Ur Screen: NOT DETECTED
Benzodiazepine, Ur Scrn: NOT DETECTED
Cannabinoid 50 Ng, Ur ~~LOC~~: NOT DETECTED
Cocaine Metabolite,Ur ~~LOC~~: NOT DETECTED
MDMA (Ecstasy)Ur Screen: NOT DETECTED
Methadone Scn, Ur: NOT DETECTED
Opiate, Ur Screen: NOT DETECTED
Phencyclidine (PCP) Ur S: NOT DETECTED
Tricyclic, Ur Screen: NOT DETECTED

## 2018-11-03 LAB — POCT PREGNANCY, URINE: Preg Test, Ur: NEGATIVE

## 2018-11-03 MED ORDER — ONDANSETRON 4 MG PO TBDP
4.0000 mg | ORAL_TABLET | Freq: Once | ORAL | Status: AC
  Administered 2018-11-03: 4 mg via ORAL
  Filled 2018-11-03: qty 1

## 2018-11-03 MED ORDER — SODIUM CHLORIDE 0.9 % IV BOLUS: 1000.0000 mL | Freq: Once | INTRAVENOUS | Status: DC

## 2018-11-03 MED ORDER — ONDANSETRON HCL 4 MG PO TABS: 4.0000 mg | ORAL_TABLET | Freq: Every day | ORAL | 0 refills | Status: DC | PRN

## 2018-11-03 MED ORDER — SODIUM CHLORIDE 0.9 % IV BOLUS
1000.0000 mL | Freq: Once | INTRAVENOUS | Status: AC
  Administered 2018-11-03: 1000 mL via INTRAVENOUS

## 2018-11-03 NOTE — Discharge Instructions (Signed)
Please call your family doctor for an appointment tomorrow or Monday.

## 2018-11-03 NOTE — ED Notes (Signed)
Patient observed walking in room, smiling and talking on phone. Patient able to transition on and off stretcher without c/o or observed difficulty.

## 2018-11-03 NOTE — ED Provider Notes (Signed)
[facility] Emergency Department Provider Note  ____________________________________________  Time seen: Approximately 2:29 PM  I have reviewed the triage vital signs and the nursing notes.   HISTORY  Chief Complaint Emesis    HPI [name] is a 28 y.o. female that presents to the emergency department for evaluation of nausea and one episode of vomiting for 3 to 4 days.  Patient states that she has felt tired.  Occasionally she feels lightheaded. This is not positional.  She was seen for similar previously 2 weeks ago.  No known fevers.  No sick contacts.  She is taking her medications as prescribed.  No illicit drug use.  She has been on her blood pressure medication for several years and has had no dosage changes.  No headache, shortness of breath, chest pain, diarrhea.   Past Medical History:  Diagnosis Date  . Anxiety   . Bipolar 1 disorder ([place])   . Cognitive deficits   . Depression   . Diabetes mellitus without complication ([place])   . Hypertension   . Mental disorder   . Mental health disorder   . Obesity     Patient Active Problem List   Diagnosis Date Noted  . MDD (major depressive disorder) 10/10/2018  . Schizoaffective disorder, bipolar type ([place]) 09/25/2018  . Bipolar 1 disorder ([place]) 06/13/2018  . HTN (hypertension) 05/03/2018  . Tobacco use disorder 05/03/2018  . Bipolar I disorder, most recent episode depressed, severe without psychotic features ([place]) 05/02/2018  . Adjustment disorder with emotional disturbance 01/02/2018  . 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 Moderate bipolar I disorder, most recent episode depressed ([place])   . Psychosis ([place])   . 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 OCD (obsessive compulsive disorder) 10/05/2016  . Major depressive disorder, recurrent episode, mild ([place]) 05/04/2016  . 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 Learning disability 07/18/2015  . 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 Diabetes mellitus ([place]) 07/18/2015  . 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 Hyperlipidemia 07/18/2015  . Severe episode of recurrent major depressive disorder, without psychotic features ([place])   . Drug overdose   . Cognitive deficits 10/12/2012  . Generalized anxiety disorder 06/28/2012    Past Surgical History:  Procedure Laterality Date  . CESAREAN SECTION    . CESAREAN SECTION N/A 04/25/2013   Procedure: REPEAT CESAREAN SECTION;  Surgeon: [name], MD;  Location: [facility];  Service: Obstetrics;  Laterality: N/A;  . MASS EXCISION N/A 06/03/2012   Procedure: EXCISION MASS;  Surgeon: [name], MD;  Location: [facility];  Service: ENT;  Laterality: N/A;  Excision uvula mass  . TONSILLECTOMY N/A 06/03/2012   Procedure: TONSILLECTOMY;  Surgeon: [name], MD;  Location: [facility];  Service: ENT;  Laterality: N/A;  . TONSILLECTOMY      Prior to Admission medications   Medication Sig Start Date End Date Taking? Authorizing Provider  ABILIFY MAINTENA 400 MG PRSY prefilled syringe Inject 400 mg into the muscle every 28 (twenty-eight) days. Next dose on 05/30/2018 05/06/18   Pucilowska, Herma Ard B, MD  clomiPRAMINE (ANAFRANIL) 25 MG capsule Take 25 mg by mouth 2 (two) times daily as needed (nerve pain).  07/26/18   [provider]  hydrochlorothiazide (HYDRODIURIL) 25 MG tablet Take 25 mg by mouth daily.    [provider]  insulin detemir (LEVEMIR) 100 UNIT/ML injection Inject 40 Units into  the skin 2 (two) times daily.     [provider]  lithium carbonate (ESKALITH) 450 MG CR tablet Take 900 mg by mouth at bedtime.  09/16/18   [provider]  ondansetron (ZOFRAN) 4 MG tablet Take 1 tablet (4 mg total) by mouth daily as needed for nausea or vomiting. 11/03/18 11/03/19  Laban Emperor,  PA-C  potassium chloride (K-DUR) 10 MEQ tablet Take 1 tablet (10 mEq total) by mouth daily. 10/23/18   Noemi Chapel, MD  propranolol (INDERAL) 10 MG tablet Take 10 mg by mouth 2 (two) times a day. 09/16/18   [provider]    Allergies Wellbutrin [bupropion], Omnipaque [iohexol], Penicillins, Cogentin [benztropine], Depakote er [divalproex sodium er], Divalproex sodium, and Iohexol  Family History  Problem Relation Age of Onset  . Hypertension Mother   . Diabetes Father     Social History Social History   Tobacco Use  . Smoking status: Current Every Day Smoker    Packs/day: 2.00    Years: 11.00    Pack years: 22.00    Types: Cigarettes  . Smokeless tobacco: Never Used  Substance Use Topics  . Alcohol use: Never    Frequency: Never  . Drug use: Never    Comment: Patient denies     Review of Systems  Constitutional: No fever/chills ENT: No upper respiratory complaints. Cardiovascular: No chest pain. Respiratory: No SOB. Gastrointestinal: No abdominal pain.  Positive for nausea and vomiting x1. Musculoskeletal: Negative for musculoskeletal pain. Skin: Negative for rash, abrasions, lacerations, ecchymosis. Neurological: Negative for headaches   ____________________________________________   PHYSICAL EXAM:  VITAL SIGNS: ED Triage Vitals [11/03/18 1154]  Enc Vitals Group     BP 114/61     Pulse Rate 75     Resp 15     Temp 98.4 F (36.9 C)     Temp Source Oral     SpO2 98 %     Weight 238 lb (108 kg)     Height 5\' 7"  (1.702 m)     Head Circumference      Peak Flow      Pain Score 0     Pain Loc      Pain Edu?      Excl. in [place]?      Constitutional: Alert and oriented. Well appearing and in no acute distress. Eyes: Conjunctivae are normal. PERRL. EOMI. Head: Atraumatic. ENT:      Ears:      Nose: No congestion/rhinnorhea.      Mouth/Throat: Mucous membranes are moist.  Neck: No stridor.  Cardiovascular: Normal rate, regular rhythm.  Good  peripheral circulation. Respiratory: Normal respiratory effort without tachypnea or retractions. Lungs CTAB. Good air entry to the bases with no decreased or absent breath sounds. Gastrointestinal: Bowel sounds 4 quadrants. Soft and nontender to palpation. No guarding or rigidity. No palpable masses. No distention.  Musculoskeletal: Full range of motion to all extremities. No gross deformities appreciated. Neurologic:  Normal speech and language. No gross focal neurologic deficits are appreciated.  Skin:  Skin is warm, dry and intact. No rash noted. Psychiatric: Mood and affect are normal. Speech and behavior are normal. Patient exhibits appropriate insight and judgement.   ____________________________________________   LABS (all labs ordered are listed, but only abnormal results are displayed)  Labs Reviewed  URINALYSIS, COMPLETE (UACMP) WITH MICROSCOPIC - Abnormal; Notable for the following components:      Result Value   Color, Urine STRAW (*)    APPearance CLEAR (*)  All other components within normal limits  GLUCOSE, CAPILLARY - Abnormal; Notable for the following components:   Glucose-Capillary 116 (*)    All other components within normal limits  URINE DRUG SCREEN, QUALITATIVE ([facility] ONLY)  POC URINE PREG, ED  CBG MONITORING, ED  POCT PREGNANCY, URINE   ____________________________________________  EKG  SR ____________________________________________  RADIOLOGY   No results found.  ____________________________________________    PROCEDURES  Procedure(s) performed:    Procedures    Medications  ondansetron (ZOFRAN-ODT) disintegrating tablet 4 mg (4 mg Oral Given 11/03/18 1300)  sodium chloride 0.9 % bolus 1,000 mL (1,000 mLs Intravenous New Bag/Given 11/03/18 1332)     ____________________________________________   INITIAL IMPRESSION / ASSESSMENT AND PLAN / ED COURSE  Pertinent labs & imaging results that were available during my care of the patient  were reviewed by me and considered in my medical decision making (see chart for details).  Review of the [place] CSRS was performed in accordance of the [place] prior to dispensing any controlled drugs.   Patient presented the emergency department for evaluation of nausea, 1 episode of vomiting, feeling tired a couple episodes of lightheadedness. ED care plan was reviewed. Vital signs and exam are reassuring.  Patient felt significantly improved after fluids and Zofran.  No abdominal tenderness.  She feels well and would like to go home.  Patient will be discharged home with prescriptions for a couple of doses of Zofran. Patient is to follow up with primary care as directed. Patient is given ED precautions to return to the ED for any worsening or new symptoms.  [name] was evaluated in Emergency Department on 11/03/2018 for the symptoms described in the history of present illness. She was evaluated in the context of the global COVID-19 pandemic, which necessitated consideration that the patient might be at risk for infection with the SARS-CoV-2 virus that causes COVID-19. Institutional protocols and algorithms that pertain to the evaluation of patients at risk for COVID-19 are in a state of rapid change based on information released by regulatory bodies including the CDC and federal and state organizations. These policies and algorithms were followed during the patient's care in the ED.   ____________________________________________  FINAL CLINICAL IMPRESSION(S) / ED DIAGNOSES  Final diagnoses:  Nausea and vomiting, intractability of vomiting not specified, unspecified vomiting type      NEW MEDICATIONS STARTED DURING THIS VISIT:  ED Discharge Orders         Ordered    ondansetron (ZOFRAN) 4 MG tablet  Daily PRN     11/03/18 1427              This chart was dictated using voice recognition software/Dragon. Despite best efforts to proofread, errors can occur which can change the  meaning. Any change was purely unintentional.    Laban Emperor, PA-C 11/03/18 2033    [name], MD 11/05/18 [phone]

## 2018-11-03 NOTE — ED Triage Notes (Signed)
N/v/d .  Brought by ems.  Patient in nad in wheel chair in lobby.  fsbs 136.  vss.

## 2018-11-03 NOTE — ED Triage Notes (Signed)
Pt arrived via ems for report of N/V and feeling tired x3-4 days - vomited x1 in 24 hours - pt denies diarrhea

## 2018-11-14 ENCOUNTER — Other Ambulatory Visit: Payer: Self-pay

## 2018-11-14 ENCOUNTER — Emergency Department
Admission: EM | Admit: 2018-11-14 | Discharge: 2018-11-14 | Disposition: A | Discharge status: Home / Self Care | Payer: Medicaid Other | Attending: Emergency Medicine | Admitting: Emergency Medicine

## 2018-11-14 DIAGNOSIS — Z794 Long term (current) use of insulin: Secondary | ICD-10-CM | POA: Diagnosis not present

## 2018-11-14 DIAGNOSIS — I10 Essential (primary) hypertension: Secondary | ICD-10-CM | POA: Insufficient documentation

## 2018-11-14 DIAGNOSIS — E119 Type 2 diabetes mellitus without complications: Secondary | ICD-10-CM | POA: Insufficient documentation

## 2018-11-14 DIAGNOSIS — N898 Other specified noninflammatory disorders of vagina: Secondary | ICD-10-CM | POA: Diagnosis present

## 2018-11-14 DIAGNOSIS — B9689 Other specified bacterial agents as the cause of diseases classified elsewhere: Secondary | ICD-10-CM | POA: Diagnosis not present

## 2018-11-14 DIAGNOSIS — F17210 Nicotine dependence, cigarettes, uncomplicated: Secondary | ICD-10-CM | POA: Diagnosis not present

## 2018-11-14 DIAGNOSIS — N760 Acute vaginitis: Secondary | ICD-10-CM | POA: Diagnosis not present

## 2018-11-14 DIAGNOSIS — Z79899 Other long term (current) drug therapy: Secondary | ICD-10-CM | POA: Diagnosis not present

## 2018-11-14 LAB — URINALYSIS, COMPLETE (UACMP) WITH MICROSCOPIC
Bacteria, UA: NONE SEEN
Bilirubin Urine: NEGATIVE
Glucose, UA: NEGATIVE mg/dL
Hgb urine dipstick: NEGATIVE
Ketones, ur: NEGATIVE mg/dL
Nitrite: NEGATIVE
Protein, ur: NEGATIVE mg/dL
Specific Gravity, Urine: 1.018 (ref 1.005–1.030)
pH: 5 (ref 5.0–8.0)

## 2018-11-14 LAB — POCT PREGNANCY, URINE: Preg Test, Ur: NEGATIVE

## 2018-11-14 LAB — CHLAMYDIA/NGC RT PCR (ARMC ONLY)
Chlamydia Tr: NOT DETECTED
N gonorrhoeae: NOT DETECTED

## 2018-11-14 LAB — WET PREP, GENITAL
Sperm: NONE SEEN
Trich, Wet Prep: NONE SEEN
Yeast Wet Prep HPF POC: NONE SEEN

## 2018-11-14 MED ORDER — METRONIDAZOLE 500 MG PO TABS: 500.0000 mg | ORAL_TABLET | Freq: Two times a day (BID) | ORAL | 0 refills | Status: DC

## 2018-11-14 NOTE — Discharge Instructions (Signed)
Follow-up with your primary care provider if any continued problems.  Begin taking the Flagyl 500 mg twice daily for 7 days.  Avoid having sex at this time as it will cause increased irritation and pain.

## 2018-11-14 NOTE — ED Notes (Signed)
First nurse note: pt here via EMS with c/o vaginal pain, burning with urination, appears in no distress.

## 2018-11-14 NOTE — ED Triage Notes (Signed)
X 3 weeks- white foul vaginal discharge, urinary pain and frequency. Pt alert and oriented X4, active, cooperative, pt in NAD. RR even and unlabored, color WNL.

## 2018-11-14 NOTE — ED Notes (Signed)
See triage note  Presents with some urinary freq with foul smell  als haivng some vaginal discharge

## 2018-11-14 NOTE — ED Provider Notes (Signed)
[facility] Emergency Department Provider Note  ____________________________________________   First MD Initiated Contact with Patient 11/14/18 1200     (approximate)  I have reviewed the triage vital signs and the nursing notes.   HISTORY  Chief Complaint Vaginal Discharge and Urinary Frequency   HPI [name] is a 28 y.o. female presents to the ED via EMS with complaint of urinary frequency as foul-smelling vaginal discharge.  Patient states that she has had discharge for approximately 3 weeks.  She denies any abdominal pain, fever, nausea or vomiting.  Currently she denies any pain.      Past Medical History:  Diagnosis Date  . Anxiety   . Bipolar 1 disorder ([place])   . Cognitive deficits   . Depression   . Diabetes mellitus without complication ([place])   . Hypertension   . Mental disorder   . Mental health disorder   . Obesity     Patient Active Problem List   Diagnosis Date Noted  . MDD (major depressive disorder) 10/10/2018  . Schizoaffective disorder, bipolar type ([place]) 09/25/2018  . Bipolar 1 disorder ([place]) 06/13/2018  . HTN (hypertension) 05/03/2018  . Tobacco use disorder 05/03/2018  . Bipolar I disorder, most recent episode depressed, severe without psychotic features ([place]) 05/02/2018  . Adjustment disorder with emotional disturbance 01/02/2018  . 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 Moderate bipolar I disorder, most recent episode depressed ([place])   . Psychosis ([place])   . 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 OCD (obsessive compulsive disorder) 10/05/2016  . Major depressive disorder, recurrent episode, mild ([place]) 05/04/2016  . 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 Learning disability 07/18/2015  . 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 Diabetes mellitus ([place]) 07/18/2015  . MDD (major depressive disorder), recurrent, severe, with  psychosis ([place]) 07/18/2015  . Hyperlipidemia 07/18/2015  . Severe episode of recurrent major depressive disorder, without psychotic features ([place])   . Drug overdose   . Cognitive deficits 10/12/2012  . Generalized anxiety disorder 06/28/2012    Past Surgical History:  Procedure Laterality Date  . CESAREAN SECTION    . CESAREAN SECTION N/A 04/25/2013   Procedure: REPEAT CESAREAN SECTION;  Surgeon: [name], MD;  Location: [facility];  Service: Obstetrics;  Laterality: N/A;  . MASS EXCISION N/A 06/03/2012   Procedure: EXCISION MASS;  Surgeon: [name], MD;  Location: [place];  Service: ENT;  Laterality: N/A;  Excision uvula mass  . TONSILLECTOMY N/A 06/03/2012   Procedure: TONSILLECTOMY;  Surgeon: [name], MD;  Location: [place];  Service: ENT;  Laterality: N/A;  . TONSILLECTOMY      Prior to Admission medications   Medication Sig Start Date End Date Taking? Authorizing Provider  ABILIFY MAINTENA 400 MG PRSY prefilled syringe Inject 400 mg into the muscle every 28 (twenty-eight) days. Next dose on 05/30/2018 05/06/18   Pucilowska, Herma Ard B, MD  clomiPRAMINE (ANAFRANIL) 25 MG capsule Take 25 mg by mouth 2 (two) times daily as needed (nerve pain).  07/26/18   [provider]  hydrochlorothiazide (HYDRODIURIL) 25 MG tablet Take 25 mg by mouth daily.    [provider]  insulin detemir (LEVEMIR) 100 UNIT/ML injection Inject 40 Units into the skin 2 (two) times daily.     [provider]  lithium carbonate (ESKALITH) 450 MG CR tablet Take 900 mg by mouth at bedtime.  09/16/18   [provider]  metroNIDAZOLE (FLAGYL) 500 MG tablet Take 1 tablet (500 mg total) by mouth 2 (two) times daily. 11/14/18   Johnn Hai, PA-C  ondansetron (ZOFRAN) 4 MG tablet Take 1 tablet (4 mg total) by mouth daily as needed for nausea or vomiting. 11/03/18 11/03/19  Laban Emperor, PA-C  potassium chloride (K-DUR) 10 MEQ tablet Take 1  tablet (10 mEq total) by mouth daily. 10/23/18   Noemi Chapel, MD  propranolol (INDERAL) 10 MG tablet Take 10 mg by mouth 2 (two) times a day. 09/16/18   [provider]    Allergies Wellbutrin [bupropion], Omnipaque [iohexol], Penicillins, Cogentin [benztropine], Depakote er [divalproex sodium er], Divalproex sodium, and Iohexol  Family History  Problem Relation Age of Onset  . Hypertension Mother   . Diabetes Father     Social History Social History   Tobacco Use  . Smoking status: Current Every Day Smoker    Packs/day: 2.00    Years: 11.00    Pack years: 22.00    Types: Cigarettes  . Smokeless tobacco: Never Used  Substance Use Topics  . Alcohol use: Never    Frequency: Never  . Drug use: Never    Comment: Patient denies    Review of Systems Constitutional: No fever/chills Cardiovascular: Denies chest pain. Respiratory: Denies shortness of breath. Gastrointestinal: No abdominal pain.  No nausea, no vomiting.  No diarrhea.  Genitourinary: Positive for dysuria.  Positive vaginal discharge. Musculoskeletal: Negative for back pain. Skin: Negative for rash. Neurological: Negative for headaches, focal weakness or numbness. ____________________________________________   PHYSICAL EXAM:  VITAL SIGNS: ED Triage Vitals [11/14/18 1144]  Enc Vitals Group     BP 119/69     Pulse Rate 70     Resp 18     Temp 99.1 F (37.3 C)     Temp Source Oral     SpO2 99 %     Weight 238 lb (108 kg)     Height 5\' 7"  (1.702 m)     Head Circumference      Peak Flow      Pain Score 0     Pain Loc      Pain Edu?      Excl. in [place]?    Constitutional: Alert and oriented. Well appearing and in no acute distress. Eyes: Conjunctivae are normal.  Head: Atraumatic. Neck: No stridor.   Cardiovascular: Normal rate, regular rhythm. Grossly normal heart sounds.  Good peripheral circulation. Respiratory: Normal respiratory effort.  No retractions. Lungs CTAB. Gastrointestinal: Soft  and nontender. No distention.  Obese. Genitourinary: Landmarks were difficult to identify secondary to patient's obesity.  External genitalia was unremarkable.  There is moderate amount of white thick exudate present in the vaginal vault.  No adnexal masses or tenderness noted.  No cervical motion tenderness. Musculoskeletal: Moves upper and lower extremities without any difficulty.  Normal gait was noted. Neurologic:  Normal speech and language. No gross focal neurologic deficits are appreciated. No gait instability. Skin:  Skin is warm, dry and intact. No rash noted. Psychiatric: Mood and affect are normal. Speech and behavior are normal.  ____________________________________________   LABS (all labs ordered are listed, but only abnormal results are displayed)  Labs Reviewed  WET PREP, GENITAL - Abnormal; Notable for the following components:      Result Value   Clue Cells Wet Prep HPF POC PRESENT (*)    WBC, Wet Prep HPF POC FEW (*)    All other components within normal limits  URINALYSIS, COMPLETE (  UACMP) WITH MICROSCOPIC - Abnormal; Notable for the following components:   Color, Urine YELLOW (*)    APPearance HAZY (*)    Leukocytes,Ua SMALL (*)    All other components within normal limits  CHLAMYDIA/NGC RT PCR ([facility] ONLY)  POC URINE PREG, ED  POCT PREGNANCY, URINE    PROCEDURES  Procedure(s) performed (including Critical Care):  Procedures   ____________________________________________   INITIAL IMPRESSION / ASSESSMENT AND PLAN / ED COURSE  As part of my medical decision making, I reviewed the following data within the electronic MEDICAL RECORD NUMBER Notes from prior ED visits and [place] Controlled Substance Database   28 year old female presents to the ED with complaint of vaginal discharge for 3 weeks.  She complains of itching and a thick discharge.  She denies any abdominal pain.  Pelvic exam is unremarkable with the exception of white thick exudate along the  vaginal walls.  Wet prep was positive for clue cells.  A GC and Chlamydia test was sent to the lab and still pending at the time of patient's discharge.  ____________________________________________   FINAL CLINICAL IMPRESSION(S) / ED DIAGNOSES  Final diagnoses:  BV (bacterial vaginosis)     ED Discharge Orders         Ordered    metroNIDAZOLE (FLAGYL) 500 MG tablet  2 times daily     11/14/18 1310           Note:  This document was prepared using Dragon voice recognition software and may include unintentional dictation errors.    Johnn Hai, PA-C 11/14/18 1313    Harvest Dark, MD 11/14/18 1435

## 2018-11-17 ENCOUNTER — Other Ambulatory Visit: Payer: Self-pay

## 2018-11-17 ENCOUNTER — Emergency Department
Admission: EM | Admit: 2018-11-17 | Discharge: 2018-11-17 | Disposition: A | Discharge status: Home / Self Care | Payer: Medicaid Other | Attending: Emergency Medicine | Admitting: Emergency Medicine

## 2018-11-17 ENCOUNTER — Encounter: Payer: Self-pay | Admitting: Emergency Medicine

## 2018-11-17 DIAGNOSIS — I10 Essential (primary) hypertension: Secondary | ICD-10-CM | POA: Insufficient documentation

## 2018-11-17 DIAGNOSIS — Z79899 Other long term (current) drug therapy: Secondary | ICD-10-CM | POA: Insufficient documentation

## 2018-11-17 DIAGNOSIS — N644 Mastodynia: Secondary | ICD-10-CM | POA: Diagnosis present

## 2018-11-17 DIAGNOSIS — F250 Schizoaffective disorder, bipolar type: Secondary | ICD-10-CM | POA: Diagnosis not present

## 2018-11-17 DIAGNOSIS — E119 Type 2 diabetes mellitus without complications: Secondary | ICD-10-CM | POA: Diagnosis not present

## 2018-11-17 DIAGNOSIS — R5383 Other fatigue: Secondary | ICD-10-CM | POA: Diagnosis not present

## 2018-11-17 DIAGNOSIS — F17210 Nicotine dependence, cigarettes, uncomplicated: Secondary | ICD-10-CM | POA: Insufficient documentation

## 2018-11-17 LAB — URINALYSIS, COMPLETE (UACMP) WITH MICROSCOPIC
Bacteria, UA: NONE SEEN
Bilirubin Urine: NEGATIVE
Glucose, UA: NEGATIVE mg/dL
Hgb urine dipstick: NEGATIVE
Ketones, ur: NEGATIVE mg/dL
Leukocytes,Ua: NEGATIVE
Nitrite: NEGATIVE
Protein, ur: 30 mg/dL — AB
Specific Gravity, Urine: 1.032 — ABNORMAL HIGH (ref 1.005–1.030)
pH: 5 (ref 5.0–8.0)

## 2018-11-17 LAB — URINE DRUG SCREEN, QUALITATIVE (ARMC ONLY)
Amphetamines, Ur Screen: NOT DETECTED
Barbiturates, Ur Screen: NOT DETECTED
Benzodiazepine, Ur Scrn: NOT DETECTED
Cannabinoid 50 Ng, Ur ~~LOC~~: NOT DETECTED
Cocaine Metabolite,Ur ~~LOC~~: NOT DETECTED
MDMA (Ecstasy)Ur Screen: NOT DETECTED
Methadone Scn, Ur: NOT DETECTED
Opiate, Ur Screen: NOT DETECTED
Phencyclidine (PCP) Ur S: NOT DETECTED
Tricyclic, Ur Screen: NOT DETECTED

## 2018-11-17 LAB — BASIC METABOLIC PANEL
Anion gap: 8 (ref 5–15)
BUN: 11 mg/dL (ref 6–20)
CO2: 23 mmol/L (ref 22–32)
Calcium: 8.8 mg/dL — ABNORMAL LOW (ref 8.9–10.3)
Chloride: 106 mmol/L (ref 98–111)
Creatinine, Ser: 0.71 mg/dL (ref 0.44–1.00)
GFR calc Af Amer: >60 mL/min (ref >60)
GFR calc non Af Amer: >60 mL/min (ref >60)
Glucose, Bld: 167 mg/dL — ABNORMAL HIGH (ref 70–99)
Potassium: 3.5 mmol/L (ref 3.5–5.1)
Sodium: 137 mmol/L (ref 135–145)

## 2018-11-17 LAB — CBC
HCT: 38.7 % (ref 36.0–46.0)
Hemoglobin: 12.4 g/dL (ref 12.0–15.0)
MCH: 26.6 pg (ref 26.0–34.0)
MCHC: 32 g/dL (ref 30.0–36.0)
MCV: 82.9 fL (ref 80.0–100.0)
Platelets: 275 10*3/uL (ref 150–400)
RBC: 4.67 MIL/uL (ref 3.87–5.11)
RDW: 13.5 % (ref 11.5–15.5)
WBC: 12 10*3/uL — ABNORMAL HIGH (ref 4.0–10.5)
nRBC: 0 % (ref 0.0–0.2)

## 2018-11-17 LAB — TSH: TSH: 0.77 u[IU]/mL (ref 0.350–4.500)

## 2018-11-17 LAB — POCT PREGNANCY, URINE: Preg Test, Ur: NEGATIVE

## 2018-11-17 NOTE — ED Triage Notes (Signed)
Also says she feels weak and tires.  Says she lives in a group home and was brought here by act team.  [name] if she has had any med changes, but says she just came to that group home.  States she is her own guardian.

## 2018-11-17 NOTE — ED Triage Notes (Signed)
sakys bilateral breast pain starteda bout 1.5-2 weeks ago and getting worse.  Constant pain outer breasts. Worse when lying down.  Also says nipples stay sore.

## 2018-11-17 NOTE — ED Provider Notes (Signed)
[facility] Emergency Department Provider Note  ____________________________________________   First MD Initiated Contact with Patient 11/17/18 1128     (approximate)  I have reviewed the triage vital signs and the nursing notes.   HISTORY  Chief Complaint Breast Pain and Fatigue    HPI [name] is a 28 y.o. female with anxiety, bipolar, diabetes, frequent ED presentations who presents with bilateral breast pain that started 2 weeks ago.  Patient also resides in a group home.  Patient says that her pain is on the top of her bilateral breast, constant, has not taken nothing to help with the pain, nothing makes it worse.  She also endorses some pain on her nipples as well.  She is not currently breast-feeding or pregnant.  Last menstruation was 1 week ago.  She denies any fevers, urinary symptoms, abdominal pain, difficulty breathing, chest pain.  She does smoke cigarettes.  She also has some associated fatigue as well.           Past Medical History:  Diagnosis Date  . Anxiety   . Bipolar 1 disorder ([place])   . Cognitive deficits   . Depression   . Diabetes mellitus without complication ([place])   . Hypertension   . Mental disorder   . Mental health disorder   . Obesity     Patient Active Problem List   Diagnosis Date Noted  . MDD (major depressive disorder) 10/10/2018  . Schizoaffective disorder, bipolar type ([place]) 09/25/2018  . Bipolar 1 disorder ([place]) 06/13/2018  . HTN (hypertension) 05/03/2018  . Tobacco use disorder 05/03/2018  . Bipolar I disorder, most recent episode depressed, severe without psychotic features ([place]) 05/02/2018  . Adjustment disorder with emotional disturbance 01/02/2018  . 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 Moderate bipolar I disorder, most recent episode depressed ([place])   . Psychosis ([place])   . 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 OCD (obsessive compulsive disorder) 10/05/2016  . Major depressive disorder, recurrent episode, mild ([place]) 05/04/2016  . 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 Learning disability 07/18/2015  . 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 Diabetes mellitus ([place]) 07/18/2015  . MDD (major depressive disorder), recurrent, severe, with psychosis ([place]) 07/18/2015  . Hyperlipidemia 07/18/2015  . Severe episode of recurrent major depressive disorder, without psychotic features ([employer])   . Drug overdose   . Cognitive deficits 10/12/2012  . Generalized anxiety disorder 06/28/2012    Past Surgical History:  Procedure Laterality Date  . CESAREAN SECTION    . CESAREAN SECTION N/A 04/25/2013   Procedure: REPEAT CESAREAN SECTION;  Surgeon: [name], MD;  Location: [facility];  Service: Obstetrics;  Laterality: N/A;  . MASS EXCISION N/A 06/03/2012   Procedure: EXCISION MASS;  Surgeon: [name], MD;  Location: [place];  Service: ENT;  Laterality: N/A;  Excision uvula mass  . TONSILLECTOMY N/A 06/03/2012   Procedure: TONSILLECTOMY;  Surgeon: [name], MD;  Location: [place];  Service: ENT;  Laterality: N/A;  . TONSILLECTOMY      Prior to Admission medications   Medication Sig Start Date End Date Taking? Authorizing Provider  ABILIFY MAINTENA 400 MG PRSY prefilled syringe Inject 400 mg into the muscle every 28 (twenty-eight) days. Next dose on 05/30/2018 05/06/18   Pucilowska, Herma Ard B, MD  clomiPRAMINE (ANAFRANIL) 25 MG capsule Take 25 mg by mouth 2 (two) times daily as needed (nerve pain).  07/26/18  [provider]  hydrochlorothiazide (HYDRODIURIL) 25 MG tablet Take 25 mg by mouth daily.    [provider]  insulin detemir (LEVEMIR) 100 UNIT/ML injection Inject 40 Units into the skin 2 (two) times daily.     [provider]  lithium carbonate (ESKALITH) 450 MG CR tablet Take 900 mg by mouth at bedtime.   09/16/18   [provider]  metroNIDAZOLE (FLAGYL) 500 MG tablet Take 1 tablet (500 mg total) by mouth 2 (two) times daily. 11/14/18   Johnn Hai, PA-C  ondansetron (ZOFRAN) 4 MG tablet Take 1 tablet (4 mg total) by mouth daily as needed for nausea or vomiting. 11/03/18 11/03/19  Laban Emperor, PA-C  potassium chloride (K-DUR) 10 MEQ tablet Take 1 tablet (10 mEq total) by mouth daily. 10/23/18   Noemi Chapel, MD  propranolol (INDERAL) 10 MG tablet Take 10 mg by mouth 2 (two) times a day. 09/16/18   [provider]    Allergies Wellbutrin [bupropion], Omnipaque [iohexol], Penicillins, Cogentin [benztropine], Depakote er [divalproex sodium er], Divalproex sodium, and Iohexol  Family History  Problem Relation Age of Onset  . Hypertension Mother   . Diabetes Father     Social History Social History   Tobacco Use  . Smoking status: Current Every Day Smoker    Packs/day: 2.00    Years: 11.00    Pack years: 22.00    Types: Cigarettes  . Smokeless tobacco: Never Used  Substance Use Topics  . Alcohol use: Never    Frequency: Never  . Drug use: Never    Comment: Patient denies      Review of Systems Constitutional: No fever/chills positive for fatigue Eyes: No visual changes. ENT: No sore throat. Cardiovascular: Denies chest pain. Respiratory: Denies shortness of breath. Gastrointestinal: No abdominal pain.  No nausea, no vomiting.  No diarrhea.  No constipation. Genitourinary: Negative for dysuria.  Positive for breast pain Musculoskeletal: Negative for back pain. Skin: Negative for rash. Neurological: Negative for headaches, focal weakness or numbness. All other ROS negative ____________________________________________   PHYSICAL EXAM:  VITAL SIGNS: ED Triage Vitals [11/17/18 0948]  Enc Vitals Group     BP 114/65     Pulse Rate 65     Resp 16     Temp 98.7 F (37.1 C)     Temp Source Oral     SpO2 100 %     Weight      Height      Head  Circumference      Peak Flow      Pain Score 10     Pain Loc      Pain Edu?      Excl. in [place]?     Constitutional: Alert and oriented. Well appearing and in no acute distress. Eyes: Conjunctivae are normal. EOMI. Head: Atraumatic. Nose: No congestion/rhinnorhea. Mouth/Throat: Mucous membranes are moist.   Neck: No stridor. Trachea Midline. FROM Cardiovascular: Normal rate, regular rhythm. Grossly normal heart sounds.  Good peripheral circulation.  Bilateral breast without any erythema or fluctuation.  Tenderness with palpation on the top of the breast. Respiratory: Normal respiratory effort.  No retractions. Lungs CTAB. Gastrointestinal: Soft and nontender. No distention. No abdominal bruits.  Musculoskeletal: No lower extremity tenderness nor edema.  No joint effusions. Neurologic:  Normal speech and language. No gross focal neurologic deficits are appreciated.  Skin:  Skin is warm, dry and intact. No rash noted. Psychiatric: Flat affect speech and behavior are normal. GU: Deferred  ____________________________________________   LABS (all labs ordered are listed, but only abnormal results are displayed)  Labs Reviewed  BASIC METABOLIC PANEL - Abnormal; Notable for the following components:      Result Value   Glucose, Bld 167 (*)    Calcium 8.8 (*)    All other components within normal limits  CBC - Abnormal; Notable for the following components:   WBC 12.0 (*)    All other components within normal limits  URINALYSIS, COMPLETE (UACMP) WITH MICROSCOPIC - Abnormal; Notable for the following components:   Color, Urine YELLOW (*)    APPearance CLOUDY (*)    Specific Gravity, Urine 1.032 (*)    Protein, ur 30 (*)    All other components within normal limits  TSH  URINE DRUG SCREEN, QUALITATIVE ([facility] ONLY)  POC URINE PREG, ED  POCT PREGNANCY, URINE  CBG MONITORING, ED   ____________________________________________   ED ECG REPORT I, Vanessa Hatfield, the attending  physician, personally viewed and interpreted this ECG.  EKG normal sinus rate of 64, no ST elevation, no T wave inversion, normal intervals. ____________________________________________ ____________________________________________   PROCEDURES  Procedure(s) performed (including Critical Care):  Procedures   ____________________________________________   INITIAL IMPRESSION / ASSESSMENT AND PLAN / ED COURSE  [name] was evaluated in Emergency Department on 11/17/2018 for the symptoms described in the history of present illness. She was evaluated in the context of the global COVID-19 pandemic, which necessitated consideration that the patient might be at risk for infection with the SARS-CoV-2 virus that causes COVID-19. Institutional protocols and algorithms that pertain to the evaluation of patients at risk for COVID-19 are in a state of rapid change based on information released by regulatory bodies including the CDC and federal and state organizations. These policies and algorithms were followed during the patient's care in the ED.    Patient is a very well-appearing 28 year old who presents with reproducible pain on the top of her breast.  No evidence of cellulitis, abscess.  No pain that radiates up into her chest to suggest ACS and given her pain is reproducible and on her breast thought less likely to be ACS.  EKG was obtained without any evidence of STEMI.  Will get labs to evaluate for anemia, pregnancy, DKA, hypothyroidism.   Thyroid studies are normal.  Glucose is slightly elevated at 167 but no evidence of DKA.  White count slightly elevated 12.  UA without evidence of UTI.  Pregnancy test is negative.    ____________________________________________   FINAL CLINICAL IMPRESSION(S) / ED DIAGNOSES   Final diagnoses:  Fatigue, unspecified type      MEDICATIONS GIVEN DURING THIS VISIT:  Medications - No data to display   ED Discharge Orders    None        Note:  This document was prepared using Dragon voice recognition software and may include unintentional dictation errors.    Vanessa Law[place], MD 11/17/18 1154

## 2018-11-17 NOTE — Discharge Instructions (Signed)
Your work-up here was reassuring.  You should follow-up with your primary care doctor.  You have a sightly elevated glucose of 167.  Take ibuprofen and tylenol for pain.  Follow up with OBGYN if pain is not getting better.   Return to the ER for any additional concerns.

## 2018-11-17 NOTE — ED Notes (Signed)
Pt c/o bilateral breast pain that has been ongoing for 2 weeks. Pt states pain is worse when lying down. Pt ambulating in room with steady gait and in NAD.

## 2019-04-23 ENCOUNTER — Other Ambulatory Visit: Payer: Self-pay

## 2019-04-23 ENCOUNTER — Emergency Department
Admission: EM | Admit: 2019-04-23 | Discharge: 2019-04-24 | Disposition: A | Discharge status: Home / Self Care | Payer: Medicaid Other | Attending: Emergency Medicine | Admitting: Emergency Medicine

## 2019-04-23 DIAGNOSIS — Z046 Encounter for general psychiatric examination, requested by authority: Secondary | ICD-10-CM | POA: Diagnosis present

## 2019-04-23 DIAGNOSIS — Z20828 Contact with and (suspected) exposure to other viral communicable diseases: Secondary | ICD-10-CM | POA: Insufficient documentation

## 2019-04-23 DIAGNOSIS — Z794 Long term (current) use of insulin: Secondary | ICD-10-CM | POA: Insufficient documentation

## 2019-04-23 DIAGNOSIS — Z79899 Other long term (current) drug therapy: Secondary | ICD-10-CM | POA: Insufficient documentation

## 2019-04-23 DIAGNOSIS — I10 Essential (primary) hypertension: Secondary | ICD-10-CM | POA: Diagnosis not present

## 2019-04-23 DIAGNOSIS — F4325 Adjustment disorder with mixed disturbance of emotions and conduct: Secondary | ICD-10-CM | POA: Diagnosis not present

## 2019-04-23 DIAGNOSIS — F3112 Bipolar disorder, current episode manic without psychotic features, moderate: Secondary | ICD-10-CM | POA: Diagnosis not present

## 2019-04-23 DIAGNOSIS — F17210 Nicotine dependence, cigarettes, uncomplicated: Secondary | ICD-10-CM | POA: Diagnosis not present

## 2019-04-23 DIAGNOSIS — R45851 Suicidal ideations: Secondary | ICD-10-CM | POA: Diagnosis not present

## 2019-04-23 DIAGNOSIS — E119 Type 2 diabetes mellitus without complications: Secondary | ICD-10-CM | POA: Diagnosis not present

## 2019-04-23 LAB — CBC
HCT: 39.8 % (ref 36.0–46.0)
Hemoglobin: 12.7 g/dL (ref 12.0–15.0)
MCH: 25.4 pg — ABNORMAL LOW (ref 26.0–34.0)
MCHC: 31.9 g/dL (ref 30.0–36.0)
MCV: 79.6 fL — ABNORMAL LOW (ref 80.0–100.0)
Platelets: 226 10*3/uL (ref 150–400)
RBC: 5 MIL/uL (ref 3.87–5.11)
RDW: 14.4 % (ref 11.5–15.5)
WBC: 10.9 10*3/uL — ABNORMAL HIGH (ref 4.0–10.5)
nRBC: 0 % (ref 0.0–0.2)

## 2019-04-23 LAB — COMPREHENSIVE METABOLIC PANEL
ALT: 18 U/L (ref 0–44)
AST: 20 U/L (ref 15–41)
Albumin: 4.1 g/dL (ref 3.5–5.0)
Alkaline Phosphatase: 34 U/L — ABNORMAL LOW (ref 38–126)
Anion gap: 9 (ref 5–15)
BUN: 15 mg/dL (ref 6–20)
CO2: 26 mmol/L (ref 22–32)
Calcium: 9.3 mg/dL (ref 8.9–10.3)
Chloride: 106 mmol/L (ref 98–111)
Creatinine, Ser: 0.86 mg/dL (ref 0.44–1.00)
GFR calc Af Amer: >60 mL/min (ref >60)
GFR calc non Af Amer: >60 mL/min (ref >60)
Glucose, Bld: 78 mg/dL (ref 70–99)
Potassium: 3.6 mmol/L (ref 3.5–5.1)
Sodium: 141 mmol/L (ref 135–145)
Total Bilirubin: 0.7 mg/dL (ref 0.3–1.2)
Total Protein: 7.7 g/dL (ref 6.5–8.1)

## 2019-04-23 LAB — ACETAMINOPHEN LEVEL: Acetaminophen (Tylenol), Serum: <10 ug/mL — ABNORMAL LOW (ref 10–30)

## 2019-04-23 LAB — RESPIRATORY PANEL BY RT PCR (FLU A&B, COVID)
Influenza A by PCR: NEGATIVE
Influenza B by PCR: NEGATIVE
SARS Coronavirus 2 by RT PCR: NEGATIVE

## 2019-04-23 LAB — URINE DRUG SCREEN, QUALITATIVE (ARMC ONLY)
Amphetamines, Ur Screen: NOT DETECTED
Barbiturates, Ur Screen: NOT DETECTED
Benzodiazepine, Ur Scrn: NOT DETECTED
Cannabinoid 50 Ng, Ur ~~LOC~~: NOT DETECTED
Cocaine Metabolite,Ur ~~LOC~~: NOT DETECTED
MDMA (Ecstasy)Ur Screen: NOT DETECTED
Methadone Scn, Ur: NOT DETECTED
Opiate, Ur Screen: NOT DETECTED
Phencyclidine (PCP) Ur S: NOT DETECTED
Tricyclic, Ur Screen: NOT DETECTED

## 2019-04-23 LAB — POCT PREGNANCY, URINE: Preg Test, Ur: NEGATIVE

## 2019-04-23 LAB — SALICYLATE LEVEL: Salicylate Lvl: <7 mg/dL — ABNORMAL LOW (ref 7.0–30.0)

## 2019-04-23 LAB — GLUCOSE, CAPILLARY
Glucose-Capillary: 106 mg/dL — ABNORMAL HIGH (ref 70–99)
Glucose-Capillary: 159 mg/dL — ABNORMAL HIGH (ref 70–99)

## 2019-04-23 LAB — ETHANOL: Alcohol, Ethyl (B): <10 mg/dL (ref <10)

## 2019-04-23 MED ORDER — DIPHENHYDRAMINE HCL 50 MG/ML IJ SOLN
25.0000 mg | Freq: Once | INTRAMUSCULAR | Status: AC
  Administered 2019-04-23: 25 mg via INTRAMUSCULAR
  Filled 2019-04-23: qty 1

## 2019-04-23 MED ORDER — INSULIN DETEMIR 100 UNIT/ML ~~LOC~~ SOLN
40.0000 [IU] | Freq: Two times a day (BID) | SUBCUTANEOUS | Status: DC
  Administered 2019-04-23: 40 [IU] via SUBCUTANEOUS
  Filled 2019-04-23 (×4): qty 0.4

## 2019-04-23 MED ORDER — HALOPERIDOL LACTATE 5 MG/ML IJ SOLN
5.0000 mg | Freq: Once | INTRAMUSCULAR | Status: AC
  Administered 2019-04-23: 5 mg via INTRAMUSCULAR
  Filled 2019-04-23: qty 1

## 2019-04-23 MED ORDER — ONDANSETRON HCL 4 MG PO TABS: 4.0000 mg | ORAL_TABLET | Freq: Every day | ORAL | Status: DC | PRN

## 2019-04-23 MED ORDER — LORAZEPAM 2 MG/ML IJ SOLN
1.0000 mg | Freq: Once | INTRAMUSCULAR | Status: AC
  Administered 2019-04-23: 1 mg via INTRAMUSCULAR
  Filled 2019-04-23: qty 1

## 2019-04-23 MED ORDER — HYDROCHLOROTHIAZIDE 25 MG PO TABS
25.0000 mg | ORAL_TABLET | Freq: Every day | ORAL | Status: DC
  Administered 2019-04-24: 25 mg via ORAL
  Filled 2019-04-23 (×3): qty 1

## 2019-04-23 MED ORDER — PROPRANOLOL HCL 10 MG PO TABS
10.0000 mg | ORAL_TABLET | Freq: Two times a day (BID) | ORAL | Status: DC
  Administered 2019-04-23 – 2019-04-24 (×2): 10 mg via ORAL
  Filled 2019-04-23 (×3): qty 1

## 2019-04-23 MED ORDER — CLOMIPRAMINE HCL 25 MG PO CAPS
25.0000 mg | ORAL_CAPSULE | Freq: Two times a day (BID) | ORAL | Status: DC | PRN
  Filled 2019-04-23: qty 1

## 2019-04-23 NOTE — ED Notes (Signed)
Hourly rounding reveals patient sleeping in room. No complaints, stable, in no acute distress. Q15 minute rounds and monitoring via [employer] to continue. 

## 2019-04-23 NOTE — ED Provider Notes (Signed)
[facility] Emergency Department Provider Note  ____________________________________________   First MD Initiated Contact with Patient 04/23/19 1413     (approximate)  I have reviewed the triage vital signs and the nursing notes.   HISTORY  Chief Complaint Suicidal    HPI [name] is a 28 y.o. female here after getting into an argument with someone at her group home and saying she wanted to hurt herself.  Patient is distressed on arrival, intermittently agitated.  She has labile affect.  She states that she does not want to leave her current group home and does not feel safe there.  She states that she has been more depressed due to getting into arguments with people at her group home and that she needs help.  She states she wants to kill herself by taking overdose of her medications.  Denies any pain.  No recent fevers or chills.  No other medical complaints.  Of note, the patient has history of bipolar disorder and mild cognitive impairment, somewhat limiting her history.   Level 5 caveat invoked as remainder of history, ROS, and physical exam limited due to patient's psych condition.        Past Medical History:  Diagnosis Date  . Anxiety   . Bipolar 1 disorder ([place])   . Cognitive deficits   . Depression   . Diabetes mellitus without complication ([place])   . Hypertension   . Mental disorder   . Mental health disorder   . Obesity     Patient Active Problem List   Diagnosis Date Noted  . MDD (major depressive disorder) 10/10/2018  . Schizoaffective disorder, bipolar type ([place]) 09/25/2018  . Bipolar 1 disorder ([place]) 06/13/2018  . HTN (hypertension) 05/03/2018  . Tobacco use disorder 05/03/2018  . Bipolar I disorder, most recent episode depressed, severe without psychotic features ([place]) 05/02/2018  . Adjustment disorder with emotional disturbance 01/02/2018  . 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 Moderate bipolar I disorder,  most recent episode depressed ([place])   . Psychosis ([place])   . 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 OCD (obsessive compulsive disorder) 10/05/2016  . Major depressive disorder, recurrent episode, mild ([place]) 05/04/2016  . 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 Learning disability 07/18/2015  . 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 Diabetes mellitus ([place]) 07/18/2015  . 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 Hyperlipidemia 07/18/2015  . Severe episode of recurrent major depressive disorder, without psychotic features ([place])   . Drug overdose   . Cognitive deficits 10/12/2012  . Generalized anxiety disorder 06/28/2012    Past Surgical History:  Procedure Laterality Date  . CESAREAN SECTION    . CESAREAN SECTION N/A 04/25/2013   Procedure: REPEAT CESAREAN SECTION;  Surgeon: [name], MD;  Location: [facility];  Service: Obstetrics;  Laterality: N/A;  . MASS EXCISION N/A 06/03/2012   Procedure: EXCISION MASS;  Surgeon: [name], MD;  Location: [place];  Service: ENT;  Laterality: N/A;  Excision uvula mass  . TONSILLECTOMY N/A 06/03/2012   Procedure: TONSILLECTOMY;  Surgeon: [name], MD;  Location: [place];  Service: ENT;  Laterality: N/A;  . TONSILLECTOMY      Prior to Admission medications   Medication Sig Start Date End Date Taking? Authorizing Provider  ABILIFY MAINTENA 400 MG PRSY prefilled syringe Inject 400 mg into the muscle every 28 (twenty-eight) days. Next dose  on 05/30/2018 05/06/18   Pucilowska, Wardell Honour, MD  clomiPRAMINE (ANAFRANIL) 25 MG capsule Take 25 mg by mouth 2 (two) times daily as needed (nerve pain).  07/26/18   [provider]  hydrochlorothiazide (HYDRODIURIL) 25 MG tablet Take 25 mg by mouth daily.    [provider]  insulin detemir (LEVEMIR) 100 UNIT/ML injection Inject  40 Units into the skin 2 (two) times daily.     [provider]  lithium carbonate (ESKALITH) 450 MG CR tablet Take 900 mg by mouth at bedtime.  09/16/18   [provider]  metroNIDAZOLE (FLAGYL) 500 MG tablet Take 1 tablet (500 mg total) by mouth 2 (two) times daily. 11/14/18   Johnn Hai, PA-C  ondansetron (ZOFRAN) 4 MG tablet Take 1 tablet (4 mg total) by mouth daily as needed for nausea or vomiting. 11/03/18 11/03/19  Laban Emperor, PA-C  potassium chloride (K-DUR) 10 MEQ tablet Take 1 tablet (10 mEq total) by mouth daily. 10/23/18   Noemi Chapel, MD  propranolol (INDERAL) 10 MG tablet Take 10 mg by mouth 2 (two) times a day. 09/16/18   [provider]    Allergies Wellbutrin [bupropion], Omnipaque [iohexol], Penicillins, Cogentin [benztropine], Depakote er [divalproex sodium er], Divalproex sodium, and Iohexol  Family History  Problem Relation Age of Onset  . Hypertension Mother   . Diabetes Father     Social History Social History   Tobacco Use  . Smoking status: Current Every Day Smoker    Packs/day: 2.00    Years: 11.00    Pack years: 22.00    Types: Cigarettes  . Smokeless tobacco: Never Used  Substance Use Topics  . Alcohol use: Never  . Drug use: Never    Comment: Patient denies    Review of Systems  Review of Systems  Unable to perform ROS: Psychiatric disorder  Psychiatric/Behavioral: Positive for agitation, behavioral problems and suicidal ideas.     ____________________________________________  PHYSICAL EXAM:      VITAL SIGNS: ED Triage Vitals  Enc Vitals Group     BP 04/23/19 1420 (!) 145/95     Pulse Rate 04/23/19 1420 75     Resp 04/23/19 1420 18     Temp 04/23/19 1420 97.8 F (36.6 C)     Temp Source 04/23/19 1420 Oral     SpO2 04/23/19 1420 100 %     Weight 04/23/19 1414 220 lb (99.8 kg)     Height 04/23/19 1414 5\' 5"  (1.651 m)     Head Circumference --      Peak Flow --      Pain Score 04/23/19 1414 0     Pain  Loc --      Pain Edu? --      Excl. in [place]? --      Physical Exam Vitals and nursing note reviewed.  Constitutional:      General: She is not in acute distress.    Appearance: She is well-developed.  HENT:     Head: Normocephalic and atraumatic.  Eyes:     Conjunctiva/sclera: Conjunctivae normal.  Cardiovascular:     Rate and Rhythm: Normal rate and regular rhythm.     Heart sounds: Normal heart sounds.  Pulmonary:     Effort: Pulmonary effort is normal. No respiratory distress.     Breath sounds: No wheezing.  Abdominal:     General: There is no distension.  Musculoskeletal:     Cervical back: Neck supple.  Skin:  General: Skin is warm.     Capillary Refill: Capillary refill takes less than 2 seconds.     Findings: No rash.  Neurological:     Mental Status: She is alert and oriented to person, place, and time.     Motor: No abnormal muscle tone.  Psychiatric:        Mood and Affect: Mood is anxious. Affect is labile.        Thought Content: Thought content is paranoid. Thought content includes suicidal ideation.        Judgment: Judgment is impulsive.       ____________________________________________   LABS (all labs ordered are listed, but only abnormal results are displayed)  Labs Reviewed  COMPREHENSIVE METABOLIC PANEL - Abnormal; Notable for the following components:      Result Value   Alkaline Phosphatase 34 (*)    All other components within normal limits  SALICYLATE LEVEL - Abnormal; Notable for the following components:   Salicylate Lvl [id number] (*)    All other components within normal limits  ACETAMINOPHEN LEVEL - Abnormal; Notable for the following components:   Acetaminophen (Tylenol), Serum <10 (*)    All other components within normal limits  CBC - Abnormal; Notable for the following components:   WBC 10.9 (*)    MCV 79.6 (*)    MCH 25.4 (*)    All other components within normal limits  RESPIRATORY PANEL BY RT PCR (FLU A&B, COVID)  ETHANOL    URINE DRUG SCREEN, QUALITATIVE ([facility] ONLY)  POC URINE PREG, ED  POCT PREGNANCY, URINE  CBG MONITORING, ED  CBG MONITORING, ED    ____________________________________________  EKG: None ________________________________________  RADIOLOGY All imaging, including plain films, CT scans, and ultrasounds, independently reviewed by me, and interpretations confirmed via formal radiology reads.  ED MD interpretation:   None  Official radiology report(s): No results found.  ____________________________________________  PROCEDURES   Procedure(s) performed (including Critical Care):  Procedures  ____________________________________________  INITIAL IMPRESSION / MDM / [place] / ED COURSE  As part of my medical decision making, I reviewed the following data within the [place] notes reviewed and incorporated, Old chart reviewed, Notes from prior ED visits, and [place] Controlled Substance Database       *Zakira Alpa Schehr was evaluated in Emergency Department on 04/23/2019 for the symptoms described in the history of present illness. She was evaluated in the context of the global COVID-19 pandemic, which necessitated consideration that the patient might be at risk for infection with the SARS-CoV-2 virus that causes COVID-19. Institutional protocols and algorithms that pertain to the evaluation of patients at risk for COVID-19 are in a state of rapid change based on information released by regulatory bodies including the CDC and federal and state organizations. These policies and algorithms were followed during the patient's care in the ED.  Some ED evaluations and interventions may be delayed as a result of limited staffing during the pandemic.*     Medical Decision Making:  28 yo F here with reported behavioral issues at home, suicidal ideation. Pt labile, yelling then crying. Concern for decompensated mania. No apparent medical emergency. Labs  reassuring. TTS/Psych consulted.  ____________________________________________  FINAL CLINICAL IMPRESSION(S) / ED DIAGNOSES  Final diagnoses:  Bipolar affective disorder, currently manic, moderate (HCC)     MEDICATIONS GIVEN DURING THIS VISIT:  Medications  clomiPRAMINE (ANAFRANIL) capsule 25 mg (has no administration in time range)  hydrochlorothiazide (HYDRODIURIL) tablet 25 mg (25 mg Oral  Not Given 04/23/19 1706)  insulin detemir (LEVEMIR) injection 40 Units (has no administration in time range)  ondansetron (ZOFRAN) tablet 4 mg (has no administration in time range)  propranolol (INDERAL) tablet 10 mg (has no administration in time range)  haloperidol lactate (HALDOL) injection 5 mg (5 mg Intramuscular Given 04/23/19 1449)  LORazepam (ATIVAN) injection 1 mg (1 mg Intramuscular Given 04/23/19 1449)  diphenhydrAMINE (BENADRYL) injection 25 mg (25 mg Intramuscular Given 04/23/19 1450)     ED Discharge Orders    None       Note:  This document was prepared using Dragon voice recognition software and may include unintentional dictation errors.   Duffy Bruce, MD 04/23/19 [phone]
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
## 2019-04-23 NOTE — ED Triage Notes (Signed)
Pt comes to ER POV from [place] after having issues at her group home. Pt states "everyone at the group home is out to get me" and "I can't really explain it but they are." Pt states that she wants to kill herself by overdosing on medications available to her at her group home. Pt states that she wants a new group home. Pt cooperative at this time.

## 2019-04-23 NOTE — Consult Note (Signed)
[place] Psychiatry Consult   Reason for Consult:  Suicidal ideations Referring Physician:  EDP Patient Identification: [name] MRN:  [id number] Principal Diagnosis: Adjustment disorder with mixed disturbance of emotions and conduct Diagnosis:  Principal Problem:   Adjustment disorder with mixed disturbance of emotions and conduct   Total Time spent with patient: 1 hour  Subjective:   [name] is a 28 y.o. female patient reports that she is having suicidal ideations because she was away from the group home.  She states that she needs to have a change.  Patient did report that she has had numerous suicide attempts and reports approximately 5.  Patient states that she does have an ACT team and it is [facility].  She denies having a legal guardian.  She reports that there is been no reason for her to be suicidal she does feels depressed and she called and told the police that she was suicidal.  Buelah Manis in [facility] was contacted at [phone] for collateral information.  Staff at the enrichment center report that the patient requested to go to the store to get some cigarettes today.  He reports that staff member took the patient to the store but instead of buying cigarettes the patient bought 2 sodas and 15 bags of chips.  He states that the patient was questioned about why she was buying chips and sodas when she stated that she want to come by cigarettes.  He reports that he return to the group home and wants the patient return to the group home she had contacted her ACT team and reported that the staff there were yelling at her and that she was tired of it.  He states then she contacted the police department and told them that she was suicidal and homicidal.  He reports that she became upset and agitated and was hitting the walls in the bed but did not hurt anyone or herself.  The group home also reported that the patient does not have access to any of her medications.  He  stated that the medications are managed by staff members and the patient's are only provided with the dose that they are prescribed at that time.  Attempted to contact [facility] at [phone] but they have not returned a phone call yet.  HPI: Patient is a 28 year old female that was brought into the ED after being assessed at [facility].  It is reported that the patient had presented reporting having suicidal and homicidal ideations with a plan to overdose on medications that are available to her at the group home.  Patient was evaluated by this provider via face-to-face.  Patient does have diagnosed learning disability and resides in a group home or in [facility].  It appears that the patient became upset with the staff over purchasing different items than what she intended to get and then reported that she wanted to leave and then contacted the police department and reported suicidal and homicidal ideations.  Patient at the current moment is continuing to endorse these however feel that this may be the patient trying to get a break from the group home as she stated that the reason for her suicidal ideations was "I need to change."  At this time feel that the patient may need respite overnight and we are still waiting on collateral information from the ACT team.  Past Psychiatric History: Adjustment disorder, MDD, bipolar disorder, GAD, learning disability.  3 reported psychiatric admissions in the last  year 1 at [facility] and 2 at [place] to Self: Suicidal Ideation: (P) Yes-Currently Present Suicidal Intent: (P) Yes-Currently Present Is patient at risk for suicide?: (P) Yes Suicidal Plan?: (P) Yes-Currently Present(pt states she will overdose on medication at the group home) Specify Current Suicidal Plan: (P) medication overdose plan Access to Means: (P) Yes Specify Access to Suicidal Means: (P) household items and medication What has been your use of drugs/alcohol within the last 12 months?: (P)  n/a How many times?: (P) 6 Other Self Harm Risks: (P) none Triggers for Past Attempts: (P) Unknown Intentional Self Injurious Behavior: (P) None Risk to Others: Homicidal Ideation: (P) No Thoughts of Harm to Others: (P) No Current Homicidal Intent: (P) No Current Homicidal Plan: (P) No Access to Homicidal Means: (P) Yes Prior Inpatient Therapy:   Prior Outpatient Therapy:    Past Medical History:  Past Medical History:  Diagnosis Date  . Anxiety   . Bipolar 1 disorder ([place])   . Cognitive deficits   . Depression   . Diabetes mellitus without complication ([place])   . Hypertension   . Mental disorder   . Mental health disorder   . Obesity     Past Surgical History:  Procedure Laterality Date  . CESAREAN SECTION    . CESAREAN SECTION N/A 04/25/2013   Procedure: REPEAT CESAREAN SECTION;  Surgeon: [name], MD;  Location: [facility];  Service: Obstetrics;  Laterality: N/A;  . MASS EXCISION N/A 06/03/2012   Procedure: EXCISION MASS;  Surgeon: [name], MD;  Location: [facility];  Service: ENT;  Laterality: N/A;  Excision uvula mass  . TONSILLECTOMY N/A 06/03/2012   Procedure: TONSILLECTOMY;  Surgeon: [name], MD;  Location: [place];  Service: ENT;  Laterality: N/A;  . TONSILLECTOMY     Family History:  Family History  Problem Relation Age of Onset  . Hypertension Mother   . Diabetes Father    Family Psychiatric  History: None reported Social History:  Social History   Substance and Sexual Activity  Alcohol Use Never     Social History   Substance and Sexual Activity  Drug Use Never   Comment: Patient denies    Social History   Socioeconomic History  . Marital status: Single    Spouse name: Not on file  . Number of children: Not on file  . Years of education: Not on file  . Highest education level: Not on file  Occupational History  . Not on file  Tobacco Use  . Smoking status: Current Every Day Smoker     Packs/day: 2.00    Years: 11.00    Pack years: 22.00    Types: Cigarettes  . Smokeless tobacco: Never Used  Substance and Sexual Activity  . Alcohol use: Never  . Drug use: Never    Comment: Patient denies  . Sexual activity: Yes    Birth control/protection: Implant  Other Topics Concern  . Not on file  Social History Narrative   ** Merged History Encounter **       Social Determinants of Health   Financial Resource Strain:   . Difficulty of Paying Living Expenses: Not on file  Food Insecurity:   . Worried About Charity fundraiser in the Last Year: Not on file  . Ran Out of Food in the Last Year: Not on file  Transportation Needs:   . Lack of Transportation (Medical): Not on file  . Lack of Transportation (Non-Medical): Not  on file  Physical Activity:   . Days of Exercise per Week: Not on file  . Minutes of Exercise per Session: Not on file  Stress:   . Feeling of Stress : Not on file  Social Connections:   . Frequency of Communication with Friends and Family: Not on file  . Frequency of Social Gatherings with Friends and Family: Not on file  . Attends Religious Services: Not on file  . Active Member of Clubs or Organizations: Not on file  . Attends Archivist Meetings: Not on file  . Marital Status: Not on file   Additional Social History:    Allergies:   Allergies  Allergen Reactions  . Wellbutrin [Bupropion] Shortness Of Breath  . Omnipaque [Iohexol] Swelling and Other (See Comments)    Reaction:  Eye swelling  . Penicillins Hives and Other (See Comments)    Has patient had a PCN reaction causing immediate rash, facial/tongue/throat swelling, SOB or lightheadedness with hypotension: Unknown Has patient had a PCN reaction causing severe rash involving mucus membranes or skin necrosis: Yes Has patient had a PCN reaction that required hospitalization Unknown Has patient had a PCN reaction occurring within the last 10 years: No If all of the above  answers are "NO", then may proceed with Cephalosporin use.  Herma Mering [Benztropine]     Make pt feel crazy  . Depakote Er [Divalproex Sodium Er] Nausea And Vomiting  . Divalproex Sodium   . Iohexol     Labs:  Results for orders placed or performed during the hospital encounter of 04/23/19 (from the past 48 hour(s))  Comprehensive metabolic panel     Status: Abnormal   Collection Time: 04/23/19  2:29 PM  Result Value Ref Range   Sodium 141 135 - 145 mmol/L   Potassium 3.6 3.5 - 5.1 mmol/L   Chloride 106 98 - 111 mmol/L   CO2 26 22 - 32 mmol/L   Glucose, Bld 78 70 - 99 mg/dL   BUN 15 6 - 20 mg/dL   Creatinine, Ser 0.86 0.44 - 1.00 mg/dL   Calcium 9.3 8.9 - 10.3 mg/dL   Total Protein 7.7 6.5 - 8.1 g/dL   Albumin 4.1 3.5 - 5.0 g/dL   AST 20 15 - 41 U/L   ALT 18 0 - 44 U/L   Alkaline Phosphatase 34 (L) 38 - 126 U/L   Total Bilirubin 0.7 0.3 - 1.2 mg/dL   GFR calc non Af Amer >60 >60 mL/min   GFR calc Af Amer >60 >60 mL/min   Anion gap 9 5 - 15    Comment: Performed at [facility], [address], [place], [place] [address]  Ethanol     Status: None   Collection Time: 04/23/19  2:29 PM  Result Value Ref Range   Alcohol, Ethyl (B) <10 <10 mg/dL    Comment: (NOTE) Lowest detectable limit for serum alcohol is 10 mg/dL. For medical purposes only. Performed at [facility], [address] [place], [place] [id number]   Salicylate level     Status: Abnormal   Collection Time: 04/23/19  2:29 PM  Result Value Ref Range   Salicylate Lvl [id number] (L) 7.0 - 30.0 mg/dL    Comment: Performed at [facility], [address] [place],  [address]  Acetaminophen level     Status: Abnormal   Collection Time: 04/23/19  2:29 PM  Result Value Ref Range   Acetaminophen (Tylenol), Serum <10 (L) 10 - 30  ug/mL    Comment: (NOTE) Therapeutic concentrations vary significantly. A range of 10-30 ug/mL  may be an effective concentration for many patients. However,  some  are best treated at concentrations outside of this range. Acetaminophen concentrations >150 ug/mL at 4 hours after ingestion  and >50 ug/mL at 12 hours after ingestion are often associated with  toxic reactions. Performed at [facility], [address] [place], [place] [address]   cbc     Status: Abnormal   Collection Time: 04/23/19  2:29 PM  Result Value Ref Range   WBC 10.9 (H) 4.0 - 10.5 K/uL   RBC 5.00 3.87 - 5.11 MIL/uL   Hemoglobin 12.7 12.0 - 15.0 g/dL   HCT 39.8 36.0 - 46.0 %   MCV 79.6 (L) 80.0 - 100.0 fL   MCH 25.4 (L) 26.0 - 34.0 pg   MCHC 31.9 30.0 - 36.0 g/dL   RDW 14.4 11.5 - 15.5 %   Platelets 226 150 - 400 K/uL   nRBC 0.0 0.0 - 0.2 %    Comment: Performed at [facility], [address] [place], [place] [address]  Urine Drug Screen, Qualitative     Status: None   Collection Time: 04/23/19  2:29 PM  Result Value Ref Range   Tricyclic, Ur Screen NONE DETECTED NONE DETECTED   Amphetamines, Ur Screen NONE DETECTED NONE DETECTED   MDMA (Ecstasy)Ur Screen NONE DETECTED NONE DETECTED   Cocaine Metabolite,Ur [place] NONE DETECTED NONE DETECTED   Opiate, Ur Screen NONE DETECTED NONE DETECTED   Phencyclidine (PCP) Ur S NONE DETECTED NONE DETECTED   Cannabinoid 50 Ng, Ur [place] NONE DETECTED NONE DETECTED   Barbiturates, Ur Screen NONE DETECTED NONE DETECTED   Benzodiazepine, Ur Scrn NONE DETECTED NONE DETECTED   Methadone Scn, Ur NONE DETECTED NONE DETECTED    Comment: (NOTE) Tricyclics + metabolites, urine    Cutoff 1000 ng/mL Amphetamines + metabolites, urine  Cutoff 1000 ng/mL MDMA (Ecstasy), urine              Cutoff 500 ng/mL Cocaine Metabolite, urine          Cutoff 300 ng/mL Opiate + metabolites, urine        Cutoff 300 ng/mL Phencyclidine (PCP), urine         Cutoff 25 ng/mL Cannabinoid, urine                 Cutoff 50 ng/mL Barbiturates + metabolites, urine  Cutoff 200 ng/mL Benzodiazepine, urine              Cutoff 200 ng/mL Methadone,  urine                   Cutoff 300 ng/mL The urine drug screen provides only a preliminary, unconfirmed analytical test result and should not be used for non-medical purposes. Clinical consideration and professional judgment should be applied to any positive drug screen result due to possible interfering substances. A more specific alternate chemical method must be used in order to obtain a confirmed analytical result. Gas chromatography / mass spectrometry (GC/MS) is the preferred confirmat ory method. Performed at [facility], [address] [place], [place] [address]   Pregnancy, urine POC     Status: None   Collection Time: 04/23/19  2:39 PM  Result Value Ref Range   Preg Test, Ur NEGATIVE NEGATIVE    Comment:        THE SENSITIVITY OF THIS METHODOLOGY IS >24 mIU/mL  Respiratory Panel by RT PCR (Flu A&B, Covid) - Nasopharyngeal Swab     Status: None   Collection Time: 04/23/19  3:04 PM   Specimen: Nasopharyngeal Swab  Result Value Ref Range   SARS Coronavirus 2 by RT PCR NEGATIVE NEGATIVE    Comment: (NOTE) SARS-CoV-2 target nucleic acids are NOT DETECTED. The SARS-CoV-2 RNA is generally detectable in upper respiratoy specimens during the acute phase of infection. The lowest concentration of SARS-CoV-2 viral copies this assay can detect is 131 copies/mL. A negative result does not preclude SARS-Cov-2 infection and should not be used as the sole basis for treatment or other patient management decisions. A negative result may occur with  improper specimen collection/handling, submission of specimen other than nasopharyngeal swab, presence of viral mutation(s) within the areas targeted by this assay, and inadequate number of viral copies (<131 copies/mL). A negative result must be combined with clinical observations, patient history, and epidemiological information. The expected result is Negative. Fact Sheet for Patients:   PinkCheek.be Fact Sheet for Healthcare Providers:  GravelBags.it This test is not yet ap proved or cleared by the [place] FDA and  has been authorized for detection and/or diagnosis of SARS-CoV-2 by FDA under an Emergency Use Authorization (EUA). This EUA will remain  in effect (meaning this test can be used) for the duration of the COVID-19 declaration under Section 564(b)(1) of the Act, 21 U.S.C. section 360bbb-3(b)(1), unless the authorization is terminated or revoked sooner.    Influenza A by PCR NEGATIVE NEGATIVE   Influenza B by PCR NEGATIVE NEGATIVE    Comment: (NOTE) The Xpert Xpress SARS-CoV-2/FLU/RSV assay is intended as an aid in  the diagnosis of influenza from Nasopharyngeal swab specimens and  should not be used as a sole basis for treatment. Nasal washings and  aspirates are unacceptable for Xpert Xpress SARS-CoV-2/FLU/RSV  testing. Fact Sheet for Patients: PinkCheek.be Fact Sheet for Healthcare Providers: GravelBags.it This test is not yet approved or cleared by the [place] FDA and  has been authorized for detection and/or diagnosis of SARS-CoV-2 by  FDA under an Emergency Use Authorization (EUA). This EUA will remain  in effect (meaning this test can be used) for the duration of the  Covid-19 declaration under Section 564(b)(1) of the Act, 21  U.S.C. section 360bbb-3(b)(1), unless the authorization is  terminated or revoked. Performed at [facility], [address] [place], [place] [address]     Current Facility-Administered Medications  Medication Dose Route Frequency Provider Last Rate Last Admin  . clomiPRAMINE (ANAFRANIL) capsule 25 mg  25 mg Oral BID PRN Duffy Bruce, MD      . hydrochlorothiazide (HYDRODIURIL) tablet 25 mg  25 mg Oral Daily Duffy Bruce, MD      . insulin detemir (LEVEMIR) injection 40 Units  40  Units Subcutaneous BID Duffy Bruce, MD      . ondansetron [facility]) tablet 4 mg  4 mg Oral Daily PRN Duffy Bruce, MD      . propranolol (INDERAL) tablet 10 mg  10 mg Oral BID Duffy Bruce, MD       Current Outpatient Medications  Medication Sig Dispense Refill  . ABILIFY MAINTENA 400 MG PRSY prefilled syringe Inject 400 mg into the muscle every 28 (twenty-eight) days. Next dose on 05/30/2018 1 each 6  . clomiPRAMINE (ANAFRANIL) 25 MG capsule Take 25 mg by mouth 2 (two) times daily as needed (nerve pain).     . hydrochlorothiazide (HYDRODIURIL) 25 MG tablet Take 25 mg by  mouth daily.    . insulin detemir (LEVEMIR) 100 UNIT/ML injection Inject 40 Units into the skin 2 (two) times daily.     Marland Kitchen lithium carbonate (ESKALITH) 450 MG CR tablet Take 900 mg by mouth at bedtime.     . metroNIDAZOLE (FLAGYL) 500 MG tablet Take 1 tablet (500 mg total) by mouth 2 (two) times daily. 14 tablet 0  . ondansetron (ZOFRAN) 4 MG tablet Take 1 tablet (4 mg total) by mouth daily as needed for nausea or vomiting. 2 tablet 0  . potassium chloride (K-DUR) 10 MEQ tablet Take 1 tablet (10 mEq total) by mouth daily. 5 tablet 0  . propranolol (INDERAL) 10 MG tablet Take 10 mg by mouth 2 (two) times a day.      Musculoskeletal: Strength & Muscle Tone: within normal limits Gait & Station: normal Patient leans: N/A  Psychiatric Specialty Exam: Physical Exam  Nursing note and vitals reviewed. Constitutional: She is oriented to person, place, and time. She appears well-developed and well-nourished.  Cardiovascular: Normal rate.  Respiratory: Effort normal.  Musculoskeletal:        General: Normal range of motion.  Neurological: She is alert and oriented to person, place, and time.  Skin: Skin is warm.    Review of Systems  Constitutional: Negative.   HENT: Negative.   Eyes: Negative.   Respiratory: Negative.   Cardiovascular: Negative.   Gastrointestinal: Negative.   Genitourinary: Negative.    Musculoskeletal: Negative.   Skin: Negative.   Neurological: Negative.   Psychiatric/Behavioral: Positive for suicidal ideas.    Blood pressure (!) 145/95, pulse 75, temperature 97.8 F (36.6 C), temperature source Oral, resp. rate 18, height 5\' 5"  (1.651 m), weight 99.8 kg, SpO2 100 %.Body mass index is 36.61 kg/m.  General Appearance: Casual  Eye Contact:  Good  Speech:  Clear and Coherent and Slow  Volume:  Decreased  Mood:  Depressed  Affect:  Flat  Thought Process:  Coherent and Descriptions of Associations: Intact  Orientation:  Full (Time, Place, and Person)  Thought Content:  WDL  Suicidal Thoughts:  Yes.  with intent/plan  Homicidal Thoughts:  No  Memory:  Immediate;   Fair Recent;   Fair Remote;   Fair  Judgement:  Impaired  Insight:  Lacking  Psychomotor Activity:  Decreased  Concentration:  Concentration: Fair  Recall:  [employer] of Knowledge:  Fair  Language:  Fair  Akathisia:  No  Handed:  Right  AIMS (if indicated):     Assets:  Desire for Improvement Financial Resources/Insurance Housing Resilience Social Support Transportation  ADL's:  Intact  Cognition:  Impaired,  Mild  Sleep:        Treatment Plan Summary: Observe overnight and await collateral information from [facility].  Disposition: Observe overnight and await collateral information from [facility]   Lewis Shock, FNP 04/23/2019 5:03 PM
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
## 2019-04-23 NOTE — ED Notes (Signed)
Meal tray provided.

## 2019-04-23 NOTE — ED Notes (Signed)
Report to include Situation, Background, Assessment, and Recommendations received from Amy B. RN. Patient alert and oriented, warm and dry, in no acute distress. Patient denies SI, HI, AVH and pain. Patient made aware of Q15 minute rounds and security cameras for their safety. Patient instructed to come to me with needs or concerns. 

## 2019-04-23 NOTE — ED Notes (Signed)
Pt given meal tray.

## 2019-04-23 NOTE — ED Notes (Addendum)
Pt was dressed out me [name] NT, she has a black pair of shoes, black panties,black jacket, colorful socks, also had a black and red shirt, black and silver hat, black tie bow that was around her hand and a black bra with a pair of silver hoop earings.

## 2019-04-24 LAB — GLUCOSE, CAPILLARY: Glucose-Capillary: 76 mg/dL (ref 70–99)

## 2019-04-24 NOTE — ED Provider Notes (Signed)
Patient has been seen by psychiatry and cleared for discharge.   [name], MD 04/24/19 [phone]

## 2019-04-24 NOTE — Consult Note (Addendum)
[facility] Psych ED Progress Note  04/24/2019 10:32 AM [name]  MRN:  [id number]  Principal Problem: Adjustment disorder with mixed disturbance of emotions and conduct Diagnosis:  Principal Problem:   Adjustment disorder with mixed disturbance of emotions and conduct  Total Time spent with patient:  35 minutes.   HPI Patient appears bright in affect today, tells me that she is feeling well.  No longer has suicidal homicidal plan, intent, drive, or preparation.  She denies auditory and visual hallucinations.  Other forms of hallucinations are not present.  Also further denies paranoia and a delusional framework.  She slept well last night.  She is eating well.  She does take her meds at the facility that she lives in.  Tells me that her ACTT team is supposed to take her to look at transitional housing in [place] which she looks forward to.  No other concerns.  She is ready to be discharged today.  Past Medical History:  Past Medical History:  Diagnosis Date  . Anxiety   . Bipolar 1 disorder ([place])   . Cognitive deficits   . Depression   . Diabetes mellitus without complication ([place])   . Hypertension   . Mental disorder   . Mental health disorder   . Obesity     Past Surgical History:  Procedure Laterality Date  . CESAREAN SECTION    . CESAREAN SECTION N/A 04/25/2013   Procedure: REPEAT CESAREAN SECTION;  Surgeon: [name], MD;  Location: [facility];  Service: Obstetrics;  Laterality: N/A;  . MASS EXCISION N/A 06/03/2012   Procedure: EXCISION MASS;  Surgeon: [name], MD;  Location: [place];  Service: ENT;  Laterality: N/A;  Excision uvula mass  . TONSILLECTOMY N/A 06/03/2012   Procedure: TONSILLECTOMY;  Surgeon: [name], MD;  Location: [place];  Service: ENT;  Laterality: N/A;  . TONSILLECTOMY     Family History:  Family History  Problem Relation Age of Onset  . Hypertension Mother   . Diabetes Father    Family Psychiatric   History:  [place].  Social History:  Social History   Substance and Sexual Activity  Alcohol Use Never     Social History   Substance and Sexual Activity  Drug Use Never   Comment: Patient denies    Social History   Socioeconomic History  . Marital status: Single    Spouse name: Not on file  . Number of children: Not on file  . Years of education: Not on file  . Highest education level: Not on file  Occupational History  . Not on file  Tobacco Use  . Smoking status: Current Every Day Smoker    Packs/day: 2.00    Years: 11.00    Pack years: 22.00    Types: Cigarettes  . Smokeless tobacco: Never Used  Substance and Sexual Activity  . Alcohol use: Never  . Drug use: Never    Comment: Patient denies  . Sexual activity: Yes    Birth control/protection: Implant  Other Topics Concern  . Not on file  Social History Narrative   ** Merged History Encounter **       Social Determinants of Health   Financial Resource Strain:   . Difficulty of Paying Living Expenses: Not on file  Food Insecurity:   . Worried About Charity fundraiser in the Last Year: Not on file  . Ran Out of Food in the Last Year: Not on file  Transportation  Needs:   . Lack of Transportation (Medical): Not on file  . Lack of Transportation (Non-Medical): Not on file  Physical Activity:   . Days of Exercise per Week: Not on file  . Minutes of Exercise per Session: Not on file  Stress:   . Feeling of Stress : Not on file  Social Connections:   . Frequency of Communication with Friends and Family: Not on file  . Frequency of Social Gatherings with Friends and Family: Not on file  . Attends Religious Services: Not on file  . Active Member of Clubs or Organizations: Not on file  . Attends Archivist Meetings: Not on file  . Marital Status: Not on file     Current Medications: Current Facility-Administered Medications  Medication Dose Route Frequency Provider Last Rate Last Admin  .  clomiPRAMINE (ANAFRANIL) capsule 25 mg  25 mg Oral BID PRN Duffy Bruce, MD      . hydrochlorothiazide (HYDRODIURIL) tablet 25 mg  25 mg Oral Daily Duffy Bruce, MD   25 mg at 04/24/19 1001  . insulin detemir (LEVEMIR) injection 40 Units  40 Units Subcutaneous BID Duffy Bruce, MD   Stopped at 04/24/19 0945  . ondansetron (ZOFRAN) tablet 4 mg  4 mg Oral Daily PRN Duffy Bruce, MD      . propranolol (INDERAL) tablet 10 mg  10 mg Oral BID Duffy Bruce, MD   10 mg at 04/24/19 [id number]   Current Outpatient Medications  Medication Sig Dispense Refill  . ABILIFY MAINTENA 400 MG PRSY prefilled syringe Inject 400 mg into the muscle every 28 (twenty-eight) days. Next dose on 05/30/2018 1 each 6  . clomiPRAMINE (ANAFRANIL) 25 MG capsule Take 25 mg by mouth 2 (two) times daily as needed (nerve pain).     . hydrochlorothiazide (HYDRODIURIL) 25 MG tablet Take 25 mg by mouth daily.    . insulin detemir (LEVEMIR) 100 UNIT/ML injection Inject 40 Units into the skin 2 (two) times daily.     Marland Kitchen lithium carbonate (ESKALITH) 450 MG CR tablet Take 900 mg by mouth at bedtime.     . metroNIDAZOLE (FLAGYL) 500 MG tablet Take 1 tablet (500 mg total) by mouth 2 (two) times daily. 14 tablet 0  . ondansetron (ZOFRAN) 4 MG tablet Take 1 tablet (4 mg total) by mouth daily as needed for nausea or vomiting. 2 tablet 0  . potassium chloride (K-DUR) 10 MEQ tablet Take 1 tablet (10 mEq total) by mouth daily. 5 tablet 0  . propranolol (INDERAL) 10 MG tablet Take 10 mg by mouth 2 (two) times a day.      Lab Results:  Results for orders placed or performed during the hospital encounter of 04/23/19 (from the past 48 hour(s))  Comprehensive metabolic panel     Status: Abnormal   Collection Time: 04/23/19  2:29 PM  Result Value Ref Range   Sodium 141 135 - 145 mmol/L   Potassium 3.6 3.5 - 5.1 mmol/L   Chloride 106 98 - 111 mmol/L   CO2 26 22 - 32 mmol/L   Glucose, Bld 78 70 - 99 mg/dL   BUN 15 6 - 20 mg/dL    Creatinine, Ser 0.86 0.44 - 1.00 mg/dL   Calcium 9.3 8.9 - 10.3 mg/dL   Total Protein 7.7 6.5 - 8.1 g/dL   Albumin 4.1 3.5 - 5.0 g/dL   AST 20 15 - 41 U/L   ALT 18 0 - 44 U/L   Alkaline Phosphatase 34 (L) 38 -  126 U/L   Total Bilirubin 0.7 0.3 - 1.2 mg/dL   GFR calc non Af Amer >60 >60 mL/min   GFR calc Af Amer >60 >60 mL/min   Anion gap 9 5 - 15    Comment: Performed at [facility], [address] [place], [place] [address]  Ethanol     Status: None   Collection Time: 04/23/19  2:29 PM  Result Value Ref Range   Alcohol, Ethyl (B) <10 <10 mg/dL    Comment: (NOTE) Lowest detectable limit for serum alcohol is 10 mg/dL. For medical purposes only. Performed at [facility], [address] [place], [place] [id number]   Salicylate level     Status: Abnormal   Collection Time: 04/23/19  2:29 PM  Result Value Ref Range   Salicylate Lvl [id number] (L) 7.0 - 30.0 mg/dL    Comment: Performed at [facility], [address] [place], [place] [address]  Acetaminophen level     Status: Abnormal   Collection Time: 04/23/19  2:29 PM  Result Value Ref Range   Acetaminophen (Tylenol), Serum <10 (L) 10 - 30 ug/mL    Comment: (NOTE) Therapeutic concentrations vary significantly. A range of 10-30 ug/mL  may be an effective concentration for many patients. However, some  are best treated at concentrations outside of this range. Acetaminophen concentrations >150 ug/mL at 4 hours after ingestion  and >50 ug/mL at 12 hours after ingestion are often associated with  toxic reactions. Performed at [facility], [address] [place], [place] [address]   cbc     Status: Abnormal   Collection Time: 04/23/19  2:29 PM  Result Value Ref Range   WBC 10.9 (H) 4.0 - 10.5 K/uL   RBC 5.00 3.87 - 5.11 MIL/uL   Hemoglobin 12.7 12.0 - 15.0 g/dL   HCT 39.8 36.0 - 46.0 %   MCV 79.6 (L) 80.0 - 100.0 fL   MCH 25.4 (L) 26.0 - 34.0 pg   MCHC 31.9 30.0 - 36.0 g/dL   RDW 14.4  11.5 - 15.5 %   Platelets 226 150 - 400 K/uL   nRBC 0.0 0.0 - 0.2 %    Comment: Performed at [facility], [address] [place], [place] [address]  Urine Drug Screen, Qualitative     Status: None   Collection Time: 04/23/19  2:29 PM  Result Value Ref Range   Tricyclic, Ur Screen NONE DETECTED NONE DETECTED   Amphetamines, Ur Screen NONE DETECTED NONE DETECTED   MDMA (Ecstasy)Ur Screen NONE DETECTED NONE DETECTED   Cocaine Metabolite,Ur [place] NONE DETECTED NONE DETECTED   Opiate, Ur Screen NONE DETECTED NONE DETECTED   Phencyclidine (PCP) Ur S NONE DETECTED NONE DETECTED   Cannabinoid 50 Ng, Ur [place] NONE DETECTED NONE DETECTED   Barbiturates, Ur Screen NONE DETECTED NONE DETECTED   Benzodiazepine, Ur Scrn NONE DETECTED NONE DETECTED   Methadone Scn, Ur NONE DETECTED NONE DETECTED    Comment: (NOTE) Tricyclics + metabolites, urine    Cutoff 1000 ng/mL Amphetamines + metabolites, urine  Cutoff 1000 ng/mL MDMA (Ecstasy), urine              Cutoff 500 ng/mL Cocaine Metabolite, urine          Cutoff 300 ng/mL Opiate + metabolites, urine        Cutoff 300 ng/mL Phencyclidine (PCP), urine         Cutoff 25 ng/mL Cannabinoid, urine  Cutoff 50 ng/mL Barbiturates + metabolites, urine  Cutoff 200 ng/mL Benzodiazepine, urine              Cutoff 200 ng/mL Methadone, urine                   Cutoff 300 ng/mL The urine drug screen provides only a preliminary, unconfirmed analytical test result and should not be used for non-medical purposes. Clinical consideration and professional judgment should be applied to any positive drug screen result due to possible interfering substances. A more specific alternate chemical method must be used in order to obtain a confirmed analytical result. Gas chromatography / mass spectrometry (GC/MS) is the preferred confirmat ory method. Performed at [facility], [address] [place], [place] [address]   Pregnancy, urine POC      Status: None   Collection Time: 04/23/19  2:39 PM  Result Value Ref Range   Preg Test, Ur NEGATIVE NEGATIVE    Comment:        THE SENSITIVITY OF THIS METHODOLOGY IS >24 mIU/mL   Respiratory Panel by RT PCR (Flu A&B, Covid) - Nasopharyngeal Swab     Status: None   Collection Time: 04/23/19  3:04 PM   Specimen: Nasopharyngeal Swab  Result Value Ref Range   SARS Coronavirus 2 by RT PCR NEGATIVE NEGATIVE    Comment: (NOTE) SARS-CoV-2 target nucleic acids are NOT DETECTED. The SARS-CoV-2 RNA is generally detectable in upper respiratoy specimens during the acute phase of infection. The lowest concentration of SARS-CoV-2 viral copies this assay can detect is 131 copies/mL. A negative result does not preclude SARS-Cov-2 infection and should not be used as the sole basis for treatment or other patient management decisions. A negative result may occur with  improper specimen collection/handling, submission of specimen other than nasopharyngeal swab, presence of viral mutation(s) within the areas targeted by this assay, and inadequate number of viral copies (<131 copies/mL). A negative result must be combined with clinical observations, patient history, and epidemiological information. The expected result is Negative. Fact Sheet for Patients:  PinkCheek.be Fact Sheet for Healthcare Providers:  GravelBags.it This test is not yet ap proved or cleared by the [place] FDA and  has been authorized for detection and/or diagnosis of SARS-CoV-2 by FDA under an Emergency Use Authorization (EUA). This EUA will remain  in effect (meaning this test can be used) for the duration of the COVID-19 declaration under Section 564(b)(1) of the Act, 21 U.S.C. section 360bbb-3(b)(1), unless the authorization is terminated or revoked sooner.    Influenza A by PCR NEGATIVE NEGATIVE   Influenza B by PCR NEGATIVE NEGATIVE    Comment: (NOTE) The  Xpert Xpress SARS-CoV-2/FLU/RSV assay is intended as an aid in  the diagnosis of influenza from Nasopharyngeal swab specimens and  should not be used as a sole basis for treatment. Nasal washings and  aspirates are unacceptable for Xpert Xpress SARS-CoV-2/FLU/RSV  testing. Fact Sheet for Patients: PinkCheek.be Fact Sheet for Healthcare Providers: GravelBags.it This test is not yet approved or cleared by the [place] FDA and  has been authorized for detection and/or diagnosis of SARS-CoV-2 by  FDA under an Emergency Use Authorization (EUA). This EUA will remain  in effect (meaning this test can be used) for the duration of the  Covid-19 declaration under Section 564(b)(1) of the Act, 21  U.S.C. section 360bbb-3(b)(1), unless the authorization is  terminated or revoked. Performed at [facility], [address], [place], [place] [address]  Glucose, capillary     Status: Abnormal   Collection Time: 04/23/19  5:55 PM  Result Value Ref Range   Glucose-Capillary 106 (H) 70 - 99 mg/dL  Glucose, capillary     Status: Abnormal   Collection Time: 04/23/19  8:56 PM  Result Value Ref Range   Glucose-Capillary 159 (H) 70 - 99 mg/dL  Glucose, capillary     Status: None   Collection Time: 04/24/19  9:20 AM  Result Value Ref Range   Glucose-Capillary 76 70 - 99 mg/dL    Blood Alcohol level:  Lab Results  Component Value Date   ETH <10 04/23/2019   ETH <10 10/23/2018   Musculoskeletal:  Strength & Muscle Tone: within normal limits  Gait & Station: normal  Patient leans: N/A  Psychiatric Specialty Exam:  Physical Exam  Nursing note and vitals reviewed.  Constitutional: She is oriented to person, place, and time. She appears well-developed and well-nourished.  Cardiovascular: Normal rate.  Respiratory: Effort normal.  Musculoskeletal:  General: Normal range of motion.  Neurological: She is alert and oriented to  person, place, and time.  Skin: Skin is warm.    Review of Systems  Constitutional: Negative.  HENT: Negative.  Eyes: Negative.  Respiratory: Negative.  Cardiovascular: Negative.  Gastrointestinal: Negative.  Genitourinary: Negative.  Musculoskeletal: Negative.  Skin: Negative.  Neurological: Negative.  Psychiatric/Behavioral: Positive for suicidal ideas.    Blood pressure (!) 145/95, pulse 75, temperature 97.8 F (36.6 C), temperature source Oral, resp. rate 18, height 5\' 5"  (1.651 m), weight 99.8 kg, SpO2 100 %.Body mass index is 36.61 kg/m.  General Appearance: Casual  Eye Contact: Good  Speech: Clear and Coherent and Slow  Volume: wnl  Mood: Good  Affect: bright  Thought Process: Coherent and Descriptions of Associations: Intact  Orientation: Full (Time, Place, and Person)  Thought Content: WDL  Suicidal Thoughts: Yes. with intent/plan  Homicidal Thoughts: No  Memory: Immediate; Good Recent; Fair  Remote; Fair  Judgement: Impaired  Insight: Good  Psychomotor Activity: Decreased  Concentration: Concentration: Fair  Recall: [employer] of Knowledge: Fair  Language: Fair  Akathisia: No  Handed: Right  AIMS (if indicated):   Assets: Desire for Improvement  Financial Resources/Insurance  Housing  Resilience  Social Support  Transportation  ADL's: Intact  Cognition: Impaired, Mild  Sleep:      Treatment Plan Summary: Patient will be discharge She no longer voices suicidal homicidal ideations.  She is not actively psychotic or manic.  The patient develop suicidal or homicidal plan intent drive or preparation please call 911 or go to local emergency department.  Patient voices agreement and understanding. Will rescind her IVC.   Rulon Sera, MD 04/24/2019, 10:32 AM

## 2019-04-24 NOTE — ED Notes (Signed)
Pt's ride is here. Pt is dressing for discharge.

## 2019-04-24 NOTE — ED Notes (Addendum)
This RN spoke with Kelley at the [place] and [employer] [facility]  Dee stated Mardene Celeste Rings will be here to pick up the patient. No specific time given.  ([phone])

## 2019-04-24 NOTE — ED Notes (Signed)
Hourly rounding reveals patient sleeping in room. No complaints, stable, in no acute distress. Q15 minute rounds and monitoring via [employer] to continue. 

## 2019-04-24 NOTE — ED Notes (Signed)
Pt discharged back to group home in care of [facility] VS stable. All belongings returned to patient. Pt denies SI/HI.  Discharge instructions reviewed with patient. Patient signed for her discharge papers.

## 2019-04-25 ENCOUNTER — Telehealth: Payer: Self-pay

## 2019-04-25 NOTE — Telephone Encounter (Signed)
Per request of Group Home Director, faxed COVID test results to [facility] and [facility] @ [phone], to attention of [place].      Osvaldo Human  [facility] Nurse Triage Pool 2 hours ago (8:52 AM)  [facility] Please fax Covid test results to group home ([place]) [phone] Attention: [place] Lopes  Thanks!

## 2019-04-27 ENCOUNTER — Emergency Department (HOSPITAL_COMMUNITY)
Admission: EM | Admit: 2019-04-27 | Discharge: 2019-04-28 | Disposition: A | Discharge status: Home / Self Care | Payer: Medicaid Other | Attending: Emergency Medicine | Admitting: Emergency Medicine

## 2019-04-27 ENCOUNTER — Other Ambulatory Visit: Payer: Self-pay

## 2019-04-27 ENCOUNTER — Encounter (HOSPITAL_COMMUNITY): Payer: Self-pay

## 2019-04-27 DIAGNOSIS — R45851 Suicidal ideations: Secondary | ICD-10-CM | POA: Insufficient documentation

## 2019-04-27 DIAGNOSIS — F17210 Nicotine dependence, cigarettes, uncomplicated: Secondary | ICD-10-CM | POA: Insufficient documentation

## 2019-04-27 DIAGNOSIS — F639 Impulse disorder, unspecified: Secondary | ICD-10-CM | POA: Diagnosis not present

## 2019-04-27 DIAGNOSIS — E119 Type 2 diabetes mellitus without complications: Secondary | ICD-10-CM | POA: Diagnosis not present

## 2019-04-27 DIAGNOSIS — I10 Essential (primary) hypertension: Secondary | ICD-10-CM | POA: Diagnosis not present

## 2019-04-27 DIAGNOSIS — Z20822 Contact with and (suspected) exposure to covid-19: Secondary | ICD-10-CM | POA: Insufficient documentation

## 2019-04-27 DIAGNOSIS — Z046 Encounter for general psychiatric examination, requested by authority: Secondary | ICD-10-CM | POA: Diagnosis present

## 2019-04-27 DIAGNOSIS — F315 Bipolar disorder, current episode depressed, severe, with psychotic features: Secondary | ICD-10-CM | POA: Diagnosis not present

## 2019-04-27 DIAGNOSIS — F250 Schizoaffective disorder, bipolar type: Secondary | ICD-10-CM | POA: Diagnosis not present

## 2019-04-27 LAB — COMPREHENSIVE METABOLIC PANEL
ALT: 24 U/L (ref 0–44)
AST: 33 U/L (ref 15–41)
Albumin: 4 g/dL (ref 3.5–5.0)
Alkaline Phosphatase: 35 U/L — ABNORMAL LOW (ref 38–126)
Anion gap: 10 (ref 5–15)
BUN: 13 mg/dL (ref 6–20)
CO2: 26 mmol/L (ref 22–32)
Calcium: 9.4 mg/dL (ref 8.9–10.3)
Chloride: 106 mmol/L (ref 98–111)
Creatinine, Ser: 0.85 mg/dL (ref 0.44–1.00)
GFR calc Af Amer: >60 mL/min (ref >60)
GFR calc non Af Amer: >60 mL/min (ref >60)
Glucose, Bld: 109 mg/dL — ABNORMAL HIGH (ref 70–99)
Potassium: 3.9 mmol/L (ref 3.5–5.1)
Sodium: 142 mmol/L (ref 135–145)
Total Bilirubin: 1 mg/dL (ref 0.3–1.2)
Total Protein: 7.1 g/dL (ref 6.5–8.1)

## 2019-04-27 LAB — RESPIRATORY PANEL BY RT PCR (FLU A&B, COVID)
Influenza A by PCR: NEGATIVE
Influenza B by PCR: NEGATIVE
SARS Coronavirus 2 by RT PCR: NEGATIVE

## 2019-04-27 LAB — CBC WITH DIFFERENTIAL/PLATELET
Abs Immature Granulocytes: 0.05 10*3/uL (ref 0.00–0.07)
Basophils Absolute: 0.1 10*3/uL (ref 0.0–0.1)
Basophils Relative: 1 %
Eosinophils Absolute: 0.4 10*3/uL (ref 0.0–0.5)
Eosinophils Relative: 4 %
HCT: 40.4 % (ref 36.0–46.0)
Hemoglobin: 12.6 g/dL (ref 12.0–15.0)
Immature Granulocytes: 0 %
Lymphocytes Relative: 24 %
Lymphs Abs: 2.8 10*3/uL (ref 0.7–4.0)
MCH: 26.3 pg (ref 26.0–34.0)
MCHC: 31.2 g/dL (ref 30.0–36.0)
MCV: 84.3 fL (ref 80.0–100.0)
Monocytes Absolute: 0.5 10*3/uL (ref 0.1–1.0)
Monocytes Relative: 4 %
Neutro Abs: 7.7 10*3/uL (ref 1.7–7.7)
Neutrophils Relative %: 67 %
Platelets: 216 10*3/uL (ref 150–400)
RBC: 4.79 MIL/uL (ref 3.87–5.11)
RDW: 14.3 % (ref 11.5–15.5)
WBC: 11.5 10*3/uL — ABNORMAL HIGH (ref 4.0–10.5)
nRBC: 0 % (ref 0.0–0.2)

## 2019-04-27 LAB — RAPID URINE DRUG SCREEN, HOSP PERFORMED
Amphetamines: NOT DETECTED
Barbiturates: NOT DETECTED
Benzodiazepines: NOT DETECTED
Cocaine: NOT DETECTED
Opiates: NOT DETECTED
Tetrahydrocannabinol: NOT DETECTED

## 2019-04-27 LAB — ETHANOL: Alcohol, Ethyl (B): <10 mg/dL (ref <10)

## 2019-04-27 LAB — I-STAT BETA HCG BLOOD, ED (MC, WL, AP ONLY): I-stat hCG, quantitative: <5 m[IU]/mL (ref <5)

## 2019-04-27 MED ORDER — HYDROXYZINE HCL 25 MG PO TABS
50.0000 mg | ORAL_TABLET | Freq: Once | ORAL | Status: AC
  Administered 2019-04-27: 19:00:00 50 mg via ORAL
  Filled 2019-04-27: qty 2

## 2019-04-27 NOTE — ED Triage Notes (Signed)
Pt brought in by her ACT team for SI and HI. Pt states she was kicked out of her group home x2 days ago for aggression towards others. Pt states she has a plan to overdose on all of her medications. States she wants to kill everyone around her because she "can't take this anymore, I need to live in a mental institution, I can't live on my own. If you all would just help me I would be fine". Pt responding to internal stimuli, states she sees demons in the room, is hitting herself in triage, and yelling to herself.

## 2019-04-27 NOTE — ED Provider Notes (Signed)
[facility] Emergency Department Provider Note MRN:  [id number]  Arrival date & time: 04/27/19     Chief Complaint   Suicidal and Homicidal   History of Present Illness   [name] is a 28 y.o. year-old female with a history of bipolar disorder, diabetes presenting to the ED with chief complaint of suicidal ideation.  Patient has had increased depression for the past 2 or 3 days. Lives alone, feels very lonely and depressed, feels like people are "out to get her". She has an act team but they're not helping. Thought seriously about overdosing on her depression medications today. Here for help. Denies any bodily complaints. Symptoms are constant, severe.  Review of Systems  A complete 10 system review of systems was obtained and all systems are negative except as noted in the HPI and PMH.   Patient's Health History    Past Medical History:  Diagnosis Date  . Anxiety   . Bipolar 1 disorder ([place])   . Cognitive deficits   . Depression   . Diabetes mellitus without complication ([place])   . Hypertension   . Mental disorder   . Mental health disorder   . Obesity     Past Surgical History:  Procedure Laterality Date  . CESAREAN SECTION    . CESAREAN SECTION N/A 04/25/2013   Procedure: REPEAT CESAREAN SECTION;  Surgeon: [name], MD;  Location: [facility];  Service: Obstetrics;  Laterality: N/A;  . MASS EXCISION N/A 06/03/2012   Procedure: EXCISION MASS;  Surgeon: [name], MD;  Location: [place];  Service: ENT;  Laterality: N/A;  Excision uvula mass  . TONSILLECTOMY N/A 06/03/2012   Procedure: TONSILLECTOMY;  Surgeon: [name], MD;  Location: [place];  Service: ENT;  Laterality: N/A;  . TONSILLECTOMY      Family History  Problem Relation Age of Onset  . Hypertension Mother   . Diabetes Father     Social History   Socioeconomic History  . Marital status: Single    Spouse name: Not on file  .  Number of children: Not on file  . Years of education: Not on file  . Highest education level: Not on file  Occupational History  . Not on file  Tobacco Use  . Smoking status: Current Every Day Smoker    Packs/day: 2.00    Years: 11.00    Pack years: 22.00    Types: Cigarettes  . Smokeless tobacco: Never Used  Substance and Sexual Activity  . Alcohol use: Never  . Drug use: Never    Comment: Patient denies  . Sexual activity: Yes    Birth control/protection: Implant  Other Topics Concern  . Not on file  Social History Narrative   ** Merged History Encounter **       Social Determinants of Health   Financial Resource Strain:   . Difficulty of Paying Living Expenses: Not on file  Food Insecurity:   . Worried About Charity fundraiser in the Last Year: Not on file  . Ran Out of Food in the Last Year: Not on file  Transportation Needs:   . Lack of Transportation (Medical): Not on file  . Lack of Transportation (Non-Medical): Not on file  Physical Activity:   . Days of Exercise per Week: Not on file  . Minutes of Exercise per Session: Not on file  Stress:   . Feeling of Stress : Not on file  Social Connections:   .  Frequency of Communication with Friends and Family: Not on file  . Frequency of Social Gatherings with Friends and Family: Not on file  . Attends Religious Services: Not on file  . Active Member of Clubs or Organizations: Not on file  . Attends Archivist Meetings: Not on file  . Marital Status: Not on file  Intimate Partner Violence:   . Fear of Current or Ex-Partner: Not on file  . Emotionally Abused: Not on file  . Physically Abused: Not on file  . Sexually Abused: Not on file     Physical Exam  Vital Signs and Nursing Notes reviewed Vitals:   04/27/19 1345  BP: (!) 162/109  Pulse: (!) 106  Resp: 14  Temp: 97.9 F (36.6 C)  SpO2: 100%    CONSTITUTIONAL: Well-appearing, NAD NEURO:  Alert and oriented x 3, no focal deficits EYES:   eyes equal and reactive ENT/NECK:  no LAD, no JVD CARDIO: Regular rate, well-perfused, normal S1 and S2 PULM:  CTAB no wheezing or rhonchi GI/GU:  normal bowel sounds, non-distended, non-tender MSK/SPINE:  No gross deformities, no edema SKIN:  no rash, atraumatic PSYCH: Depressed speech and behavior  Diagnostic and Interventional Summary    EKG Interpretation  Date/Time:    Ventricular Rate:    PR Interval:    QRS Duration:   QT Interval:    QTC Calculation:   R Axis:     Text Interpretation:        Labs Reviewed  COMPREHENSIVE METABOLIC PANEL - Abnormal; Notable for the following components:      Result Value   Glucose, Bld 109 (*)    Alkaline Phosphatase 35 (*)    All other components within normal limits  CBC WITH DIFFERENTIAL/PLATELET - Abnormal; Notable for the following components:   WBC 11.5 (*)    All other components within normal limits  RESPIRATORY PANEL BY RT PCR (FLU A&B, COVID)  ETHANOL  RAPID URINE DRUG SCREEN, HOSP PERFORMED  I-STAT BETA HCG BLOOD, ED ([facility], [facility], [facility] ONLY)    No orders to display    Medications  hydrOXYzine (ATARAX/VISTARIL) tablet 50 mg (50 mg Oral Given 04/27/19 1846)     Procedures  /  Critical Care Procedures  ED Course and Medical Decision Making  I have reviewed the triage vital signs and the nursing notes.  Pertinent labs & imaging results that were available during my care of the patient were reviewed by me and considered in my medical decision making (see below for details).     Question of major depressive disorder with suicidal ideation versus malingering/secondary gain. Patient is requesting to be admitted to the hospital. Recent ED visit on the 27th, discharged by psychiatry. Will consult TTS.  Awaiting TTS recommendations, signed out to default provider at shift change.  Barth Kirks. Sedonia Small, MD [place] [email or web address]  Final Clinical Impressions(s) / ED Diagnoses      ICD-10-CM   1. Suicidal ideations  R45.851     ED Discharge Orders    None       Discharge Instructions Discussed with and Provided to Patient:   Discharge Instructions   None       Maudie Flakes, MD 04/27/19 2244

## 2019-04-27 NOTE — BH Assessment (Addendum)
Pt is in hallway. [facility] staff will call TTS at [phone] when Pt is ready for tele-assessment.   Evelena Peat, [facility] [place] Triage Specialist [phone]

## 2019-04-27 NOTE — BHH Counselor (Signed)
Clock stopped, pt unable to complete assessment at this time, Pt to be placed in room by nurse Mckenzie. Nurse to contact TTS once pt is moved to room to complete assessment.

## 2019-04-27 NOTE — BH Assessment (Signed)
Tele Assessment Note   Patient Name: [name] MRN: [id number] Referring Physician: Gerlene Fee, MD Location of Patient: [name], [facility] Location of Provider: [place] is an 28 y.o. single female who presents unaccompanied to [facility] ED after being transported by a member of her ACTT team. Pt has a diagnosis of bipolar disorder and psychotic symptoms. Pt reports she was kicked out of her group home two days ago and note from triage says this was due to aggression towards others. Pt says she has felt suicidal for the past two days with plan to overdose on her medications. She says she has a history of suicide attempts in the past by overdose. She also reports that "people are out to get me." Note from triage states that Pt sees demons, however during assessment Pt denies auditory or visual hallucinations. Note from triage states Pt was hitting herself and yelling to herself. Pt says she has not been sleeping well. She describes her mood recently as "hyper". Pt acknowledges symptoms including crying spells, fatigue, irritability, decreased concentration, decreased sleep, and feelings of hopelessness. Pt states "my nerves are bad." Note from triage states she wants to kill everyone around her because she "can't take this anymore", however during assessment Pt denies current thoughts of harming others. Pt reports she has used alcohol, marijuana and cocaine in the past but denies use of any substances in seven months.  Pt states she is receiving ACTT services from [facility] and they recommended she come to [facility] today. She says they have arranged for her to stay in a motel and it is paid for a week. She says she would like to live with her mother but her mother will not allow it. She says she needs to live in "a mental institution" because she cannot live on her own. She states she does not have a legal guardian, stating "I would love to have a legal guardian."  She states she has three children who have been adopted. She denies history of abuse or trauma. She denies legal problems. Pt states she has been inpatient at [facility] and [facility] in the past but cannot remember dates. She says she does not want to go to [facility].  TTS left voicemail for [facility] asking them to contact TTS.  Pt is dressed in hospital scrubs, drowsy, and oriented x4. Pt speaks in a clear tone, at moderate volume and normal pace. Motor behavior appears normal. Eye contact is good. Pt's mood is depressed and anxious; affect is congruent with mood. Thought process is coherent and relevant. There is no indication from Pt's behavior that she is currently responding to internal stimuli. Pt was calm and cooperative throughout assessment. She says she is willing to sign voluntarily into a psychiatric facility.   Diagnosis: F31.5 Bipolar I disorder, Current or most recent episode depressed, With psychotic features  Past Medical History:  Past Medical History:  Diagnosis Date  . Anxiety   . Bipolar 1 disorder ([place])   . Cognitive deficits   . Depression   . Diabetes mellitus without complication ([place])   . Hypertension   . Mental disorder   . Mental health disorder   . Obesity     Past Surgical History:  Procedure Laterality Date  . CESAREAN SECTION    . CESAREAN SECTION N/A 04/25/2013   Procedure: REPEAT CESAREAN SECTION;  Surgeon: [name], MD;  Location: [facility];  Service: Obstetrics;  Laterality:  N/A;  . MASS EXCISION N/A 06/03/2012   Procedure: EXCISION MASS;  Surgeon: [name], MD;  Location: [facility];  Service: ENT;  Laterality: N/A;  Excision uvula mass  . TONSILLECTOMY N/A 06/03/2012   Procedure: TONSILLECTOMY;  Surgeon: [name], MD;  Location: [place] [facility];  Service: ENT;  Laterality: N/A;  . TONSILLECTOMY      Family History:  Family History  Problem Relation Age of Onset  . Hypertension Mother   . Diabetes  Father     Social History:  reports that she has been smoking cigarettes. She has a 22.00 pack-year smoking history. She has never used smokeless tobacco. She reports that she does not drink alcohol or use drugs.  Additional Social History:  Alcohol / Drug Use Pain Medications: Denies abuse Prescriptions: Denies abuse Over the Counter: Denies abuse History of alcohol / drug use?: Yes(Pt reports history of using alcohol, marijuana and crack in the past) Longest period of sobriety (when/how long): 7 months currently  CIWA: CIWA-Ar BP: (!) 162/109 Pulse Rate: (!) 106 COWS:    Allergies:  Allergies  Allergen Reactions  . Wellbutrin [Bupropion] Shortness Of Breath  . Omnipaque [Iohexol] Swelling and Other (See Comments)    Reaction:  Eye swelling  . Penicillins Hives and Other (See Comments)    Has patient had a PCN reaction causing immediate rash, facial/tongue/throat swelling, SOB or lightheadedness with hypotension: Unknown Has patient had a PCN reaction causing severe rash involving mucus membranes or skin necrosis: Yes Has patient had a PCN reaction that required hospitalization Unknown Has patient had a PCN reaction occurring within the last 10 years: No If all of the above answers are "NO", then may proceed with Cephalosporin use.  Herma Mering [Benztropine]     Make pt feel crazy  . Depakote Er [Divalproex Sodium Er] Nausea And Vomiting  . Divalproex Sodium   . Iohexol     Home Medications: (Not in a hospital admission)   OB/GYN Status:  No LMP recorded.  General Assessment Data Assessment unable to be completed: Yes Reason for not completing assessment: pt unable to be assessed currently in hallway Location of Assessment: [facility] ED TTS Assessment: In system Is this a Tele or Face-to-Face Assessment?: Tele Assessment Is this an Initial Assessment or a Re-assessment for this encounter?: Initial Assessment Patient Accompanied by:: N/A Language Other than English:  No Living Arrangements: Other (Comment)(Staying in motel) What gender do you identify as?: Female Marital status: Single Maiden name: Houx Pregnancy Status: No Living Arrangements: Other (Comment)(Staying in motel) Can pt return to current living arrangement?: Yes Admission Status: Voluntary Is patient capable of signing voluntary admission?: Yes Referral Source: Self/Family/Friend Insurance type: Medicaid     Crisis Care Plan Living Arrangements: Other (Comment)(Staying in motel) Legal Guardian: Other:(Self) Name of Psychiatrist: [facility] Name of Therapist: ACT Team -- [facility]  Education Status Is patient currently in school?: No Is the patient employed, unemployed or receiving disability?: Receiving disability income  Risk to self with the past 6 months Suicidal Ideation: Yes-Currently Present Has patient been a risk to self within the past 6 months prior to admission? : Yes Suicidal Intent: Yes-Currently Present Has patient had any suicidal intent within the past 6 months prior to admission? : Yes Is patient at risk for suicide?: Yes Suicidal Plan?: Yes-Currently Present Has patient had any suicidal plan within the past 6 months prior to admission? : Yes Specify Current Suicidal Plan: Overdose on all her medications Access to  Means: Yes Specify Access to Suicidal Means: Pt has access to multiple medications What has been your use of drugs/alcohol within the last 12 months?: Pt reports history of using alcohol, cannabis and crack in the past Previous Attempts/Gestures: Yes How many times?: 3 Other Self Harm Risks: Pt hits herself when upset Triggers for Past Attempts: Hallucinations Intentional Self Injurious Behavior: Bruising Comment - Self Injurious Behavior: Pt hits herself when upset Family Suicide History: No Recent stressful life event(s): Other (Comment)(Kicked out of group home) Persecutory voices/beliefs?: Yes Depression: Yes Depression  Symptoms: Despondent, Tearfulness, Fatigue, Feeling angry/irritable Substance abuse history and/or treatment for substance abuse?: No Suicide prevention information given to non-admitted patients: Not applicable  Risk to Others within the past 6 months Homicidal Ideation: No Does patient have any lifetime risk of violence toward others beyond the six months prior to admission? : Yes (comment) Thoughts of Harm to Others: No Current Homicidal Intent: No Current Homicidal Plan: No Access to Homicidal Means: No Describe Access to Homicidal Means: None Identified Victim: None History of harm to others?: No Assessment of Violence: In past 6-12 months Violent Behavior Description: Pt was aggressive at group home Does patient have access to weapons?: No Criminal Charges Pending?: No Does patient have a court date: No Is patient on probation?: No  Psychosis Hallucinations: Visual(reports seeing demons) Delusions: Persecutory  Mental Status Report Appearance/Hygiene: In scrubs Eye Contact: Good Motor Activity: Freedom of movement, Unremarkable Speech: Logical/coherent Level of Consciousness: Quiet/awake Mood: Anxious, Depressed Affect: Depressed Anxiety Level: Moderate Thought Processes: Coherent, Relevant Judgement: Partial Orientation: Person, Place, Time, Situation Obsessive Compulsive Thoughts/Behaviors: None  Cognitive Functioning Concentration: Normal Memory: Recent Intact, Remote Intact Is patient IDD: No Insight: Poor Impulse Control: Fair Appetite: Fair Have you had any weight changes? : No Change Sleep: Decreased Total Hours of Sleep: 4 Vegetative Symptoms: None  ADLScreening [facility] Assessment Services) Patient's cognitive ability adequate to safely complete daily activities?: Yes Patient able to express need for assistance with ADLs?: Yes Independently performs ADLs?: Yes (appropriate for developmental age)  Prior Inpatient Therapy Prior Inpatient Therapy:  Yes Prior Therapy Dates: 2017 Prior Therapy Facilty/Provider(s): [facility], [facility] Reason for Treatment: depression  Prior Outpatient Therapy Prior Outpatient Therapy: Yes Prior Therapy Dates: present Prior Therapy Facilty/Provider(s): [facility]; [facility] Reason for Treatment: depression Does patient have an ACCT team?: Yes([facility] ACTT) Does patient have Intensive In-House Services?  : No Does patient have [facility] services? : No Does patient have P4CC services?: No  ADL Screening (condition at time of admission) Patient's cognitive ability adequate to safely complete daily activities?: Yes Is the patient deaf or have difficulty hearing?: No Does the patient have difficulty seeing, even when wearing glasses/contacts?: No Does the patient have difficulty concentrating, remembering, or making decisions?: No Patient able to express need for assistance with ADLs?: Yes Does the patient have difficulty dressing or bathing?: No Independently performs ADLs?: Yes (appropriate for developmental age) Does the patient have difficulty walking or climbing stairs?: No Weakness of Legs: None Weakness of Arms/Hands: None  Home Assistive Devices/Equipment Home Assistive Devices/Equipment: None    Abuse/Neglect Assessment (Assessment to be complete while patient is alone) Abuse/Neglect Assessment Can Be Completed: Yes Physical Abuse: Denies Verbal Abuse: Denies Sexual Abuse: Denies Exploitation of patient/patient's resources: Denies Self-Neglect: Denies     Regulatory affairs officer (For Healthcare) Does Patient Have a Medical Advance Directive?: No Would patient like information on creating a medical advance directive?: No - Patient declined  Disposition: Gave clinical report to Talbot Grumbling, NP who recommended Pt be observed and evaluated by psychiatry in the morning. [name], [facility], said Pt is not appropriate for observation unit. Notified Annie Main, RN who said he would notify the  EDP of recommendation.  Disposition Initial Assessment Completed for this Encounter: Yes  This service was provided via telemedicine using a 2-way, interactive audio and video technology.  Names of all persons participating in this telemedicine service and their role in this encounter. Name: [name] Role: Patient  Name: Storm Frisk, [facility] Role: TTS counselor         Orpah Greek Anson Fret, [facility], [facility] Triage Specialist [phone]  Evelena Peat 04/27/2019 11:44 PM

## 2019-04-28 ENCOUNTER — Other Ambulatory Visit: Payer: Self-pay

## 2019-04-28 DIAGNOSIS — F250 Schizoaffective disorder, bipolar type: Secondary | ICD-10-CM

## 2019-04-28 DIAGNOSIS — F639 Impulse disorder, unspecified: Secondary | ICD-10-CM

## 2019-04-28 DIAGNOSIS — R45851 Suicidal ideations: Secondary | ICD-10-CM

## 2019-04-28 LAB — CBG MONITORING, ED: Glucose-Capillary: 136 mg/dL — ABNORMAL HIGH (ref 70–99)

## 2019-04-28 MED ORDER — ONDANSETRON HCL 4 MG PO TABS
4.0000 mg | ORAL_TABLET | Freq: Once | ORAL | Status: AC
  Administered 2019-04-28: 10:00:00 4 mg via ORAL
  Filled 2019-04-28: qty 1

## 2019-04-28 MED ORDER — HYDROXYZINE HCL 50 MG PO TABS
50.0000 mg | ORAL_TABLET | Freq: Four times a day (QID) | ORAL | Status: DC | PRN
  Administered 2019-04-28 (×3): 50 mg via ORAL
  Filled 2019-04-28 (×3): qty 1

## 2019-04-28 NOTE — Discharge Instructions (Signed)
Please follow-up with your outpatient psychiatry team.  If any symptoms change or worsen, please return to the nearest emergency department.

## 2019-04-28 NOTE — ED Notes (Signed)
Pt using the phone at nurses station.

## 2019-04-28 NOTE — ED Notes (Addendum)
Pt states that "her nerves have been worked up but the medicine is helping."

## 2019-04-28 NOTE — ED Notes (Signed)
Ordered diet tray 

## 2019-04-28 NOTE — ED Notes (Signed)
ED Provider at bedside. 

## 2019-04-28 NOTE — ED Notes (Signed)
Patient is upset that she has been so long and states she is ready to go; pt denies SI and states she just want to leave because she has never been here this long; EDP notified to come see patient-Monique,RN

## 2019-04-28 NOTE — ED Provider Notes (Signed)
Nursing called to report that patient would like to be discharged from the hospital.  Says that she no longer has SI or HI or any other complaints.  She denies any physical symptoms.  Chart review shows that the psychiatry team felt inpatient management would be appropriate for her however as patient is denying any SI or HI at this time, do not feel she needs IVC or needs to be held at this time.  Patient assures that she will follow-up with her outpatient psychiatry team and will follow up with her PCP.  She denied other complaints and will be discharged for outpatient management.]  Clinical Impression: 1. Suicidal ideations     Disposition: Discharge  Condition: Good  I have discussed the results, Dx and Tx plan with the pt(& family if present). He/she/they expressed understanding and agree(s) with the plan. Discharge instructions discussed at great length. Strict return precautions discussed and pt &/or family have verbalized understanding of the instructions. No further questions at time of discharge.    Discharge Medication List as of 04/28/2019  8:37 PM      Follow Up: Your outpatient psychiatrist and PCP     [place] EM[facility] DEPARTMENT [address] [id number] mc [place]       Shandrea Lusk, Gwenyth Allegra, MD 04/28/19 2224

## 2019-04-28 NOTE — Consult Note (Signed)
Telepsych Consultation   Reason for Consult:  Suicidal ideations Referring Physician:  EDP Location of Patient: [name] Location of Provider: [place] [facility]  Patient Identification: [name] MRN:  [id number] Principal Diagnosis: Schizoaffective disorder, bipolar type ([place]) Diagnosis:  Principal Problem:   Schizoaffective disorder, bipolar type ([place]) Active Problems:   Impulse control disorder   Total Time spent with patient: 30 minutes  [name], 29 y.o., female patient presented to [name] with her ACT team for suicidal and homicidal ideations.  Patient seen via telepsych by this provider; chart reviewed and consulted with Dr. Mallie Darting on 04/28/19.  On evaluation [name] collaborates information obtained in [place] assessment. She relates she is progressing agitation without specific provocative factors. Randomly alerted this writer that has has "OCD" and would like to stop shaving her head and eyebrows.     During evaluation [name] is in her room walking around; She is alert/oriented x 4; voices agitation and anxiousness but cooperative; mood congruent with affect.  Patient is speaking in a clear tone at moderate volume, and normal pace; with good eye contact.  Her thought process is coherent and relevant; There is no indication that she is currently responding to internal/external stimuli or experiencing delusional thought content.  Patient endorses suicidal/self-harm/homicidal ideations but denies a plan.  Patient has answered all questions but visibly restless during interview.    Past Psychiatric History:Schizoaffective disorder; bipolar type, impulse control Risk to Self: Suicidal Ideation: Yes-Currently Present Suicidal Intent: Yes-Currently Present Is patient at risk for suicide?: Yes Suicidal Plan?: Yes-Currently Present Specify Current Suicidal Plan: Overdose on all her medications Access to Means:  Yes Specify Access to Suicidal Means: Pt has access to multiple medications What has been your use of drugs/alcohol within the last 12 months?: Pt reports history of using alcohol, cannabis and crack in the past How many times?: 3 Other Self Harm Risks: Pt hits herself when upset Triggers for Past Attempts: Hallucinations Intentional Self Injurious Behavior: Bruising Comment - Self Injurious Behavior: Pt hits herself when upset Risk to Others: Homicidal Ideation: No Thoughts of Harm to Others: No Current Homicidal Intent: No Current Homicidal Plan: No Access to Homicidal Means: No Describe Access to Homicidal Means: None Identified Victim: None History of harm to others?: No Assessment of Violence: In past 6-12 months Violent Behavior Description: Pt was aggressive at group home Does patient have access to weapons?: No Criminal Charges Pending?: No Does patient have a court date: No Prior Inpatient Therapy: Prior Inpatient Therapy: Yes Prior Therapy Dates: 2017 Prior Therapy Facilty/Provider(s): [facility] Reason for Treatment: depression Prior Outpatient Therapy: Prior Outpatient Therapy: Yes Prior Therapy Dates: present Prior Therapy Facilty/Provider(s): [facility]; [facility] Reason for Treatment: depression Does patient have an ACCT team?: Yes([facility]) Does patient have Intensive In-House Services?  : No Does patient have [facility] services? : No Does patient have P4[facility] services?: No  Past Medical History:  Past Medical History:  Diagnosis Date  . Anxiety   . Bipolar 1 disorder ([place])   . Cognitive deficits   . Depression   . Diabetes mellitus without complication ([place])   . Hypertension   . Mental disorder   . Mental health disorder   . Obesity     Past Surgical History:  Procedure Laterality Date  . CESAREAN SECTION    . CESAREAN SECTION N/A 04/25/2013   Procedure: REPEAT CESAREAN SECTION;  Surgeon: [name], MD;  Location: [facility];  Service: Obstetrics;   Laterality: N/A;  . MASS EXCISION N/A 06/03/2012   Procedure: EXCISION MASS;  Surgeon: [name], MD;  Location: [facility];  Service: ENT;  Laterality: N/A;  Excision uvula mass  . TONSILLECTOMY N/A 06/03/2012   Procedure: TONSILLECTOMY;  Surgeon: [name], MD;  Location: [facility];  Service: ENT;  Laterality: N/A;  . TONSILLECTOMY     Family History:  Family History  Problem Relation Age of Onset  . Hypertension Mother   . Diabetes Father    Family Psychiatric  History: unknown Social History:  Social History   Substance and Sexual Activity  Alcohol Use Never     Social History   Substance and Sexual Activity  Drug Use Never   Comment: Patient denies    Social History   Socioeconomic History  . Marital status: Single    Spouse name: Not on file  . Number of children: Not on file  . Years of education: Not on file  . Highest education level: Not on file  Occupational History  . Not on file  Tobacco Use  . Smoking status: Current Every Day Smoker    Packs/day: 2.00    Years: 11.00    Pack years: 22.00    Types: Cigarettes  . Smokeless tobacco: Never Used  Substance and Sexual Activity  . Alcohol use: Never  . Drug use: Never    Comment: Patient denies  . Sexual activity: Yes    Birth control/protection: Implant  Other Topics Concern  . Not on file  Social History Narrative   ** Merged History Encounter **       Social Determinants of Health   Financial Resource Strain:   . Difficulty of Paying Living Expenses: Not on file  Food Insecurity:   . Worried About Charity fundraiser in the Last Year: Not on file  . Ran Out of Food in the Last Year: Not on file  Transportation Needs:   . Lack of Transportation (Medical): Not on file  . Lack of Transportation (Non-Medical): Not on file  Physical Activity:   . Days of Exercise per Week: Not on file  . Minutes of Exercise per Session: Not on file  Stress:   . Feeling  of Stress : Not on file  Social Connections:   . Frequency of Communication with Friends and Family: Not on file  . Frequency of Social Gatherings with Friends and Family: Not on file  . Attends Religious Services: Not on file  . Active Member of Clubs or Organizations: Not on file  . Attends Archivist Meetings: Not on file  . Marital Status: Not on file   Additional Social History:    Allergies:   Allergies  Allergen Reactions  . Wellbutrin [Bupropion] Shortness Of Breath  . Omnipaque [Iohexol] Swelling and Other (See Comments)    Reaction:  Eye swelling  . Penicillins Hives and Other (See Comments)    Has patient had a PCN reaction causing immediate rash, facial/tongue/throat swelling, SOB or lightheadedness with hypotension: Unknown Has patient had a PCN reaction causing severe rash involving mucus membranes or skin necrosis: Yes Has patient had a PCN reaction that required hospitalization Unknown Has patient had a PCN reaction occurring within the last 10 years: No If all of the above answers are "NO", then may proceed with Cephalosporin use.  Herma Mering [Benztropine]     Make pt feel crazy  . Depakote Er [Divalproex Sodium Er] Nausea  And Vomiting  . Divalproex Sodium   . Iohexol     Labs:  Results for orders placed or performed during the hospital encounter of 04/27/19 (from the past 48 hour(s))  Comprehensive metabolic panel     Status: Abnormal   Collection Time: 04/27/19  3:38 PM  Result Value Ref Range   Sodium 142 135 - 145 mmol/L   Potassium 3.9 3.5 - 5.1 mmol/L    Comment: HEMOLYSIS AT THIS LEVEL MAY AFFECT RESULT   Chloride 106 98 - 111 mmol/L   CO2 26 22 - 32 mmol/L   Glucose, Bld 109 (H) 70 - 99 mg/dL   BUN 13 6 - 20 mg/dL   Creatinine, Ser 0.85 0.44 - 1.00 mg/dL   Calcium 9.4 8.9 - 10.3 mg/dL   Total Protein 7.1 6.5 - 8.1 g/dL   Albumin 4.0 3.5 - 5.0 g/dL   AST 33 15 - 41 U/L   ALT 24 0 - 44 U/L   Alkaline Phosphatase 35 (L) 38 - 126 U/L    Total Bilirubin 1.0 0.3 - 1.2 mg/dL   GFR calc non Af Amer >60 >60 mL/min   GFR calc Af Amer >60 >60 mL/min   Anion gap 10 5 - 15    Comment: Performed at [facility], [address], [place], [place] [address]  Ethanol     Status: None   Collection Time: 04/27/19  3:38 PM  Result Value Ref Range   Alcohol, Ethyl (B) <10 <10 mg/dL    Comment: (NOTE) Lowest detectable limit for serum alcohol is 10 mg/dL. For medical purposes only. Performed at [facility], [place] [address] [place], [place] [address]   CBC with Diff     Status: Abnormal   Collection Time: 04/27/19  3:38 PM  Result Value Ref Range   WBC 11.5 (H) 4.0 - 10.5 K/uL   RBC 4.79 3.87 - 5.11 MIL/uL   Hemoglobin 12.6 12.0 - 15.0 g/dL   HCT 40.4 36.0 - 46.0 %   MCV 84.3 80.0 - 100.0 fL   MCH 26.3 26.0 - 34.0 pg   MCHC 31.2 30.0 - 36.0 g/dL   RDW 14.3 11.5 - 15.5 %   Platelets 216 150 - 400 K/uL    Comment: REPEATED TO VERIFY   nRBC 0.0 0.0 - 0.2 %   Neutrophils Relative % 67 %   Neutro Abs 7.7 1.7 - 7.7 K/uL   Lymphocytes Relative 24 %   Lymphs Abs 2.8 0.7 - 4.0 K/uL   Monocytes Relative 4 %   Monocytes Absolute 0.5 0.1 - 1.0 K/uL   Eosinophils Relative 4 %   Eosinophils Absolute 0.4 0.0 - 0.5 K/uL   Basophils Relative 1 %   Basophils Absolute 0.1 0.0 - 0.1 K/uL   Immature Granulocytes 0 %   Abs Immature Granulocytes 0.05 0.00 - 0.07 K/uL    Comment: Performed at [facility], [address] [place], [place] [address]  I-Stat beta hCG blood, ED     Status: None   Collection Time: 04/27/19  3:48 PM  Result Value Ref Range   I-stat hCG, quantitative <5.0 <5 mIU/mL   Comment 3            Comment:   GEST. AGE      CONC.  (mIU/mL)   <=1 WEEK        5 - 50     2 WEEKS       50 -  500     3 WEEKS       100 - 10,000     4 WEEKS     1,000 - 30,000        FEMALE AND NON-PREGNANT FEMALE:     LESS THAN 5 mIU/mL   Respiratory Panel by RT PCR (Flu A&B, Covid) - Nasopharyngeal Swab     Status: None    Collection Time: 04/27/19  4:26 PM   Specimen: Nasopharyngeal Swab  Result Value Ref Range   SARS Coronavirus 2 by RT PCR NEGATIVE NEGATIVE    Comment: (NOTE) SARS-CoV-2 target nucleic acids are NOT DETECTED. The SARS-CoV-2 RNA is generally detectable in upper respiratoy specimens during the acute phase of infection. The lowest concentration of SARS-CoV-2 viral copies this assay can detect is 131 copies/mL. A negative result does not preclude SARS-Cov-2 infection and should not be used as the sole basis for treatment or other patient management decisions. A negative result may occur with  improper specimen collection/handling, submission of specimen other than nasopharyngeal swab, presence of viral mutation(s) within the areas targeted by this assay, and inadequate number of viral copies (<131 copies/mL). A negative result must be combined with clinical observations, patient history, and epidemiological information. The expected result is Negative. Fact Sheet for Patients:  PinkCheek.be Fact Sheet for Healthcare Providers:  GravelBags.it This test is not yet ap proved or cleared by the [place] FDA and  has been authorized for detection and/or diagnosis of SARS-CoV-2 by FDA under an Emergency Use Authorization (EUA). This EUA will remain  in effect (meaning this test can be used) for the duration of the COVID-19 declaration under Section 564(b)(1) of the Act, 21 U.S.C. section 360bbb-3(b)(1), unless the authorization is terminated or revoked sooner.    Influenza A by PCR NEGATIVE NEGATIVE   Influenza B by PCR NEGATIVE NEGATIVE    Comment: (NOTE) The Xpert Xpress SARS-CoV-2/FLU/RSV assay is intended as an aid in  the diagnosis of influenza from Nasopharyngeal swab specimens and  should not be used as a sole basis for treatment. Nasal washings and  aspirates are unacceptable for Xpert Xpress SARS-CoV-2/FLU/RSV   testing. Fact Sheet for Patients: PinkCheek.be Fact Sheet for Healthcare Providers: GravelBags.it This test is not yet approved or cleared by the [place] FDA and  has been authorized for detection and/or diagnosis of SARS-CoV-2 by  FDA under an Emergency Use Authorization (EUA). This EUA will remain  in effect (meaning this test can be used) for the duration of the  Covid-19 declaration under Section 564(b)(1) of the Act, 21  U.S.C. section 360bbb-3(b)(1), unless the authorization is  terminated or revoked. Performed at [facility], [place] [address] [place], [place] [address]   Urine rapid drug screen (hosp performed)     Status: None   Collection Time: 04/27/19  9:56 PM  Result Value Ref Range   Opiates NONE DETECTED NONE DETECTED   Cocaine NONE DETECTED NONE DETECTED   Benzodiazepines NONE DETECTED NONE DETECTED   Amphetamines NONE DETECTED NONE DETECTED   Tetrahydrocannabinol NONE DETECTED NONE DETECTED   Barbiturates NONE DETECTED NONE DETECTED    Comment: (NOTE) DRUG SCREEN FOR MEDICAL PURPOSES ONLY.  IF CONFIRMATION IS NEEDED FOR ANY PURPOSE, NOTIFY LAB WITHIN 5 DAYS. LOWEST DETECTABLE LIMITS FOR URINE DRUG SCREEN Drug Class                     Cutoff (ng/mL) Amphetamine and metabolites    1000 Barbiturate and metabolites  200 Benzodiazepine                 [id number] Tricyclics and metabolites     300 Opiates and metabolites        300 Cocaine and metabolites        300 THC                            50 Performed at [facility], [place] [address], [place], [place] [address]     Medications:  Current Facility-Administered Medications  Medication Dose Route Frequency Provider Last Rate Last Admin  . hydrOXYzine (ATARAX/VISTARIL) tablet 50 mg  50 mg Oral Q6H PRN Dorie Rank, MD   50 mg at 04/28/19 1000   Current Outpatient Medications  Medication Sig Dispense Refill  . ABILIFY MAINTENA 400 MG  PRSY prefilled syringe Inject 400 mg into the muscle every 28 (twenty-eight) days. Next dose on 05/30/2018 1 each 6  . clomiPRAMINE (ANAFRANIL) 25 MG capsule Take 25 mg by mouth 2 (two) times daily.     . hydrochlorothiazide (HYDRODIURIL) 25 MG tablet Take 25 mg by mouth daily.    . insulin detemir (LEVEMIR) 100 UNIT/ML injection Inject 40 Units into the skin 2 (two) times daily.     Marland Kitchen lithium carbonate (ESKALITH) 450 MG CR tablet Take 900 mg by mouth at bedtime.     . medroxyPROGESTERone Acetate (DEPO-PROVERA IM) Inject into the muscle every 3 (three) months.    . ondansetron (ZOFRAN) 4 MG tablet Take 1 tablet (4 mg total) by mouth daily as needed for nausea or vomiting. 2 tablet 0  . potassium chloride (K-DUR) 10 MEQ tablet Take 1 tablet (10 mEq total) by mouth daily. 5 tablet 0  . propranolol (INDERAL) 10 MG tablet Take 10 mg by mouth 2 (two) times a day.      Musculoskeletal: Unable to assess via telepsych  Psychiatric Specialty Exam: Physical Exam  Constitutional: She is oriented to person, place, and time. She appears well-developed.  HENT:  Head: Normocephalic.  Eyes: Pupils are equal, round, and reactive to light.  Cardiovascular: Normal rate.  Respiratory: Effort normal.  Musculoskeletal:        General: Normal range of motion.     Cervical back: Normal range of motion.  Neurological: She is alert and oriented to person, place, and time.  Psychiatric: Her speech is normal. Her mood appears anxious. She is agitated and hyperactive. She expresses impulsivity. She expresses homicidal and suicidal ideation.    Review of Systems  Psychiatric/Behavioral: Positive for suicidal ideas.    Blood pressure 122/70, pulse 76, temperature 98.6 F (37 C), temperature source Oral, resp. rate 20, SpO2 97 %.There is no height or weight on file to calculate BMI.  General Appearance: Bizarre and patient eyebrows and hair are shaved bald  Eye Contact:  Good  Speech:  Normal Rate  Volume:  Normal   Mood:  Anxious and Irritable  Affect:  Congruent  Thought Process:  Coherent and Descriptions of Associations: Circumstantial  Orientation:  Full (Time, Place, and Person)  Thought Content:  Illogical  Suicidal Thoughts:  Yes.  without intent/plan  Homicidal Thoughts:  Yes.  without intent/plan  Memory:  Immediate;   Good Recent;   Good Remote;   Good  Judgement:  Impaired  Insight:  Lacking  Psychomotor Activity:  Increased  Concentration:  Concentration: Fair and Attention Span: Fair  Recall:  Good  Fund of Knowledge:  Fair  Language:  Good  Akathisia:  Negative  Handed:  Right  AIMS (if indicated):     Assets:  Desire for Improvement Housing Resilience Social Support  ADL's:  Intact  Cognition:  WNL  Sleep:   <6 hours    Disposition: Patient continues to meet criteria for inpatient hospitalization, currently awaiting facility acceptance. Recommend restart home medications and continue with vistaril prn for anxiety.   Spoke with Dr. Sherry Ruffing; informed of above recommendation and disposition  This service was provided via telemedicine using a 2-way, interactive audio and video technology.  Names of all persons participating in this telemedicine service and their role in this encounter. Name: Shonya Rewerts Role: Patient  Name: Merlyn Lot Role: PMHNP    Mallie Darting, NP 04/28/2019 4:17 PM

## 2019-04-28 NOTE — ED Notes (Signed)
Pt visibly becoming more anxious and complaining of same; provided with PRN Atarax

## 2019-04-28 NOTE — ED Notes (Signed)
Pt is requesting to leave; EDP made aware and will converse with the pt. Pt is currently calm and cooperative and is using the nurses station phone.

## 2019-04-28 NOTE — ED Notes (Signed)
Pt vomited medication that was just given. EDP notified.

## 2019-04-28 NOTE — ED Notes (Signed)
Pt provided a sandwich by sitter.

## 2019-04-28 NOTE — ED Notes (Signed)
[facility] called by RN to inform EDP decision to D/C home; Patient denies SI at d/c-Monique,RN

## 2019-04-28 NOTE — ED Notes (Signed)
TTS brought to [facility]

## 2019-04-28 NOTE — ED Notes (Signed)
Patient called mother to inform that she will be discharged; pt states to she will go to hotel where she has paid up for 4 days-Monique,RN

## 2019-05-18 ENCOUNTER — Other Ambulatory Visit: Payer: Self-pay

## 2019-05-18 ENCOUNTER — Observation Stay (HOSPITAL_COMMUNITY)
Admission: RE | Admit: 2019-05-18 | Discharge: 2019-05-19 | Disposition: A | Discharge status: Home / Self Care | Payer: Medicaid Other | Attending: Psychiatry | Admitting: Psychiatry

## 2019-05-18 ENCOUNTER — Encounter (HOSPITAL_COMMUNITY): Payer: Self-pay | Admitting: Psychiatry

## 2019-05-18 DIAGNOSIS — Z8249 Family history of ischemic heart disease and other diseases of the circulatory system: Secondary | ICD-10-CM | POA: Diagnosis not present

## 2019-05-18 DIAGNOSIS — E669 Obesity, unspecified: Secondary | ICD-10-CM | POA: Insufficient documentation

## 2019-05-18 DIAGNOSIS — I10 Essential (primary) hypertension: Secondary | ICD-10-CM | POA: Insufficient documentation

## 2019-05-18 DIAGNOSIS — Z793 Long term (current) use of hormonal contraceptives: Secondary | ICD-10-CM | POA: Diagnosis not present

## 2019-05-18 DIAGNOSIS — Z833 Family history of diabetes mellitus: Secondary | ICD-10-CM | POA: Diagnosis not present

## 2019-05-18 DIAGNOSIS — Z794 Long term (current) use of insulin: Secondary | ICD-10-CM | POA: Insufficient documentation

## 2019-05-18 DIAGNOSIS — Z880 Allergy status to penicillin: Secondary | ICD-10-CM | POA: Diagnosis not present

## 2019-05-18 DIAGNOSIS — Z20822 Contact with and (suspected) exposure to covid-19: Secondary | ICD-10-CM | POA: Diagnosis not present

## 2019-05-18 DIAGNOSIS — Z888 Allergy status to other drugs, medicaments and biological substances status: Secondary | ICD-10-CM | POA: Insufficient documentation

## 2019-05-18 DIAGNOSIS — Z6839 Body mass index (BMI) 39.0-39.9, adult: Secondary | ICD-10-CM | POA: Diagnosis not present

## 2019-05-18 DIAGNOSIS — Z79899 Other long term (current) drug therapy: Secondary | ICD-10-CM | POA: Insufficient documentation

## 2019-05-18 DIAGNOSIS — E119 Type 2 diabetes mellitus without complications: Secondary | ICD-10-CM | POA: Insufficient documentation

## 2019-05-18 DIAGNOSIS — F319 Bipolar disorder, unspecified: Secondary | ICD-10-CM | POA: Diagnosis not present

## 2019-05-18 LAB — GLUCOSE, CAPILLARY: Glucose-Capillary: 119 mg/dL — ABNORMAL HIGH (ref 70–99)

## 2019-05-18 LAB — RESPIRATORY PANEL BY RT PCR (FLU A&B, COVID)
Influenza A by PCR: NEGATIVE
Influenza B by PCR: NEGATIVE
SARS Coronavirus 2 by RT PCR: NEGATIVE

## 2019-05-18 MED ORDER — ACETAMINOPHEN 325 MG PO TABS: 650.0000 mg | ORAL_TABLET | Freq: Four times a day (QID) | ORAL | Status: DC | PRN

## 2019-05-18 MED ORDER — TRAZODONE HCL 50 MG PO TABS: 50.0000 mg | ORAL_TABLET | Freq: Every evening | ORAL | Status: DC | PRN

## 2019-05-18 MED ORDER — MAGNESIUM HYDROXIDE 400 MG/5ML PO SUSP: 30.0000 mL | Freq: Every day | ORAL | Status: DC | PRN

## 2019-05-18 MED ORDER — ALUM & MAG HYDROXIDE-SIMETH 200-200-20 MG/5ML PO SUSP: 30.0000 mL | ORAL | Status: DC | PRN

## 2019-05-18 MED ORDER — HYDROXYZINE HCL 25 MG PO TABS: 25.0000 mg | ORAL_TABLET | Freq: Three times a day (TID) | ORAL | Status: DC | PRN

## 2019-05-18 NOTE — BH Assessment (Signed)
Assessment Note  [name] is an 29 y.o. female who presents to [facility] voluntarily as a walk-in. Pt states she has not taken her medication for the past 2 days because she does not have access to her medication and she was told by her housing program to come to [facility]. Pt was reportedly acting erratic today and 911 had to be called to the home. Pt states she does not know the name of her housing program, other than it was set up by her ACTT team. Pt states she has been feeling paranoid, like people are out to get her and talking badly about her. Pt denies SI, HI, and AVH. Pt has a hx of suicide attempts and a hx of VH. Pt was recently assessed by TTS on 04/27/19 due to SI with a plan to OD on her medication. Pt has been admitted to inpt facilities multiple times in the past c/o SI and Bipolar d/o. Pt states her mother is her support system. TTS asked the pt for consent to contact her collateral supports and she declined stating "they will just ask what I'm doing here." Pt states she has been having difficulty sleeping due to "walking the streets all day." Pt states she wakes up around 6am and "just walks."   TTS attempted to contact the [name] [phone], no answer, left HIPAA compliant v/m.   Per Talbot Grumbling, NP recommends overnight observation for safety and stabilization and to be reassessed in the AM by psych.  Diagnosis: Bipolar affective d/o; Alcohol use d/o, moderate  Past Medical History:  Past Medical History:  Diagnosis Date  . Anxiety   . Bipolar 1 disorder ([place])   . Cognitive deficits   . Depression   . Diabetes mellitus without complication ([place])   . Hypertension   . Mental disorder   . Mental health disorder   . Obesity     Past Surgical History:  Procedure Laterality Date  . CESAREAN SECTION    . CESAREAN SECTION N/A 04/25/2013   Procedure: REPEAT CESAREAN SECTION;  Surgeon: [name], MD;  Location: [facility];  Service: Obstetrics;  Laterality: N/A;  . MASS  EXCISION N/A 06/03/2012   Procedure: EXCISION MASS;  Surgeon: [name], MD;  Location: [facility];  Service: ENT;  Laterality: N/A;  Excision uvula mass  . TONSILLECTOMY N/A 06/03/2012   Procedure: TONSILLECTOMY;  Surgeon: [name], MD;  Location: [facility];  Service: ENT;  Laterality: N/A;  . TONSILLECTOMY      Family History:  Family History  Problem Relation Age of Onset  . Hypertension Mother   . Diabetes Father     Social History:  reports that she has been smoking cigarettes. She has a 22.00 pack-year smoking history. She has never used smokeless tobacco. She reports that she does not drink alcohol or use drugs.  Additional Social History:  Alcohol / Drug Use Pain Medications: See MAR Prescriptions: See MAR Over the Counter: See MAR History of alcohol / drug use?: Yes Withdrawal Symptoms: Patient aware of relationship between substance abuse and physical/medical complications Substance #1 Name of Substance 1: Alcohol 1 - Age of First Use: 20s 1 - Amount (size/oz): 48 oz beer 1 - Frequency: daily 1 - Duration: ongoing 1 - Last Use / Amount: 05/18/19  CIWA: CIWA-Ar BP: 136/74 Pulse Rate: (!) 111 COWS:    Allergies:  Allergies  Allergen Reactions  . Wellbutrin [Bupropion] Shortness Of Breath  . Omnipaque [Iohexol]  Swelling and Other (See Comments)    Reaction:  Eye swelling  . Penicillins Hives and Other (See Comments)    Has patient had a PCN reaction causing immediate rash, facial/tongue/throat swelling, SOB or lightheadedness with hypotension: Unknown Has patient had a PCN reaction causing severe rash involving mucus membranes or skin necrosis: Yes Has patient had a PCN reaction that required hospitalization Unknown Has patient had a PCN reaction occurring within the last 10 years: No If all of the above answers are "NO", then may proceed with Cephalosporin use.  Herma Mering [Benztropine]     Make pt feel crazy  . Depakote  Er [Divalproex Sodium Er] Nausea And Vomiting  . Divalproex Sodium   . Iohexol     Home Medications:  Medications Prior to Admission  Medication Sig Dispense Refill  . ABILIFY MAINTENA 400 MG PRSY prefilled syringe Inject 400 mg into the muscle every 28 (twenty-eight) days. Next dose on 05/30/2018 1 each 6  . clomiPRAMINE (ANAFRANIL) 25 MG capsule Take 25 mg by mouth 2 (two) times daily.     . hydrochlorothiazide (HYDRODIURIL) 25 MG tablet Take 25 mg by mouth daily.    . insulin detemir (LEVEMIR) 100 UNIT/ML injection Inject 40 Units into the skin 2 (two) times daily.     Marland Kitchen lithium carbonate (ESKALITH) 450 MG CR tablet Take 900 mg by mouth at bedtime.     . medroxyPROGESTERone Acetate (DEPO-PROVERA IM) Inject into the muscle every 3 (three) months.    . ondansetron (ZOFRAN) 4 MG tablet Take 1 tablet (4 mg total) by mouth daily as needed for nausea or vomiting. 2 tablet 0  . potassium chloride (K-DUR) 10 MEQ tablet Take 1 tablet (10 mEq total) by mouth daily. 5 tablet 0  . propranolol (INDERAL) 10 MG tablet Take 10 mg by mouth 2 (two) times a day.      OB/GYN Status:  No LMP recorded.  General Assessment Data Location of Assessment: [facility] TTS Assessment: In system Is this a Tele or Face-to-Face Assessment?: Face-to-Face Is this an Initial Assessment or a Re-assessment for this encounter?: Initial Assessment Patient Accompanied by:: N/A Language Other than English: No Living Arrangements: Other (Comment) What gender do you identify as?: Female Marital status: Single Pregnancy Status: No Living Arrangements: Other (Comment)(roommates) Can pt return to current living arrangement?: Yes Admission Status: Voluntary Is patient capable of signing voluntary admission?: Yes Referral Source: Self/Family/Friend Insurance type: MCD  Medical Screening Exam ([place]) Medical Exam completed: Yes  Crisis Care Plan Living Arrangements: Other (Comment)(roommates) Name  of Psychiatrist: [facility] Name of Therapist: [facility] -- [facility]  Education Status Is patient currently in school?: No Is the patient employed, unemployed or receiving disability?: Receiving disability income  Risk to self with the past 6 months Suicidal Ideation: No-Not Currently/Within Last 6 Months Has patient been a risk to self within the past 6 months prior to admission? : Yes Suicidal Intent: No-Not Currently/Within Last 6 Months Has patient had any suicidal intent within the past 6 months prior to admission? : Yes Is patient at risk for suicide?: No Suicidal Plan?: No-Not Currently/Within Last 6 Months Has patient had any suicidal plan within the past 6 months prior to admission? : Yes Specify Current Suicidal Plan: 2 weeks ago pt made threat to OD on medication  Access to Means: Yes Specify Access to Suicidal Means: pt has access to medication What has been your use of drugs/alcohol within the last 12 months?: alcohol Previous  Attempts/Gestures: Yes How many times?: 3 Other Self Harm Risks: hx of suicide attempts Triggers for Past Attempts: Hallucinations Intentional Self Injurious Behavior: Bruising Comment - Self Injurious Behavior: pt has a hx of hitting herself Family Suicide History: No Recent stressful life event(s): Other (Comment)(paranoid thoughts, not taking meds) Persecutory voices/beliefs?: Yes Depression: Yes Depression Symptoms: Insomnia, Feeling worthless/self pity, Loss of interest in usual pleasures Substance abuse history and/or treatment for substance abuse?: Yes Suicide prevention information given to non-admitted patients: Not applicable  Risk to Others within the past 6 months Homicidal Ideation: No Does patient have any lifetime risk of violence toward others beyond the six months prior to admission? : No Thoughts of Harm to Others: No Current Homicidal Intent: No Current Homicidal Plan: No Access to Homicidal Means: No History of  harm to others?: No Assessment of Violence: None Noted Does patient have access to weapons?: No Criminal Charges Pending?: No Does patient have a court date: No Is patient on probation?: No  Psychosis Hallucinations: Visual Delusions: Persecutory  Mental Status Report Appearance/Hygiene: Unremarkable Eye Contact: Good Motor Activity: Freedom of movement Speech: Logical/coherent Level of Consciousness: Alert Mood: Labile, Anxious, Preoccupied Affect: Flat, Preoccupied, Anxious Anxiety Level: Moderate Thought Processes: Coherent, Relevant Judgement: Partial Orientation: Person, Place, Appropriate for developmental age, Situation, Time Obsessive Compulsive Thoughts/Behaviors: None  Cognitive Functioning Concentration: Normal Memory: Remote Intact, Recent Intact Is patient IDD: No Insight: Fair Impulse Control: Good Appetite: Good Have you had any weight changes? : No Change Sleep: Decreased Total Hours of Sleep: 4 Vegetative Symptoms: None  ADLScreening [facility] Assessment Services) Patient's cognitive ability adequate to safely complete daily activities?: Yes Patient able to express need for assistance with ADLs?: Yes Independently performs ADLs?: Yes (appropriate for developmental age)  Prior Inpatient Therapy Prior Inpatient Therapy: Yes Prior Therapy Dates: 2020, 2019 and mult other admissions Prior Therapy Facilty/Provider(s): [facility], [facility] Reason for Treatment: Bipolar  Prior Outpatient Therapy Prior Outpatient Therapy: Yes Prior Therapy Dates: present Prior Therapy Facilty/Provider(s): [facility]; [facility] Reason for Treatment: depression Does patient have an ACCT team?: Yes Does patient have Intensive In-House Services?  : No Does patient have [facility] services? : No Does patient have P4CC services?: No  ADL Screening (condition at time of admission) Patient's cognitive ability adequate to safely complete daily activities?: Yes Is the patient deaf or have  difficulty hearing?: No Does the patient have difficulty seeing, even when wearing glasses/contacts?: No Does the patient have difficulty concentrating, remembering, or making decisions?: No Patient able to express need for assistance with ADLs?: Yes Does the patient have difficulty dressing or bathing?: No Independently performs ADLs?: Yes (appropriate for developmental age) Does the patient have difficulty walking or climbing stairs?: No Weakness of Legs: None Weakness of Arms/Hands: None  Home Assistive Devices/Equipment Home Assistive Devices/Equipment: None    Abuse/Neglect Assessment (Assessment to be complete while patient is alone) Abuse/Neglect Assessment Can Be Completed: Yes Physical Abuse: Denies Verbal Abuse: Denies Sexual Abuse: Denies Exploitation of patient/patient's resources: Denies Self-Neglect: Denies     Regulatory affairs officer (For Healthcare) Does Patient Have a Medical Advance Directive?: No Would patient like information on creating a medical advance directive?: No - Patient declined          Disposition: Per Adaku Anike, NP recommends overnight observation for safety and stabilization and to be reassessed in the AM by psych. Disposition Initial Assessment Completed for this Encounter: Yes Disposition of Patient: (overnight OBS pending AM psych assessment) Patient refused recommended treatment: No  On Site  Evaluation by:   Reviewed with Physician:    Lyanne Co 05/18/2019 8:45 PM

## 2019-05-18 NOTE — H&P (Addendum)
[facility] Provider Admission PAA/H&P  Patient Identification: [name] MRN:  [id number] Date of Evaluation:  05/18/2019 Chief Complaint:  Erractic behavior Principal Diagnosis: Bipolar 1 disorder ([place]) Diagnosis:  Principal Problem:   Bipolar 1 disorder ([place])  History of Present Illness:    [name] is an 29 y.o. female who was brought in voluntarily by the police. Pt reports she was acting out, yelling and talking loudly at the housing program where she lives and the staff called the police. Pt reports she has been off her psychiatric medication for two days due to her ACTT team not delivering them. Pt states she has been paranoid for the past 2 days and feels like someone is trying to hurt her. She denies SI/HI and AVH. She has had a suicidal attempt in the past. She denies access to guns or weapons. She was last seen at [facility]/27/2019 for SI. She receives outpatient services through [facility]. Pt states she needs to get on her medication. States she has been having difficulty sleeping and gets 3-4 hours of sleep a day; her appetite is good.  During evaluation lying in bed; she is alert/oriented x 4; and mood is anxious congruent with affect. Pt is speaking in a clear tone at moderate volume, and normal pace; with minimal eye contact. Her thought process is coherent and relevant; There is no indication that she is currently responding to internal/external stimuli or experiencing delusional thought content. Pt's insight, judgement and impulse control is fair at this time. Pt has answered questions appropriately.    Associated Signs/Symptoms: Depression Symptoms:  NA (Hypo) Manic Symptoms:  Impulsivity, Anxiety Symptoms:  NA Psychotic Symptoms:  Paranoia, PTSD Symptoms: NA Total Time spent with patient: 30 minutes  Past Psychiatric History: Yes  Is the patient at risk to self? Yes.    Has the patient been a risk to self in the past 6 months? No.   Has the patient been a risk to self within the distant past? No.  Is the patient a risk to others? No.  Has the patient been a risk to others in the past 6 months? No.  Has the patient been a risk to others within the distant past? No.   Prior Inpatient Therapy: Prior Inpatient Therapy: Yes Prior Therapy Dates: 2020, 2019 and mult other admissions Prior Therapy Facilty/Provider(s): [facility], [place] Reason for Treatment: Bipolar Prior Outpatient Therapy: Prior Outpatient Therapy: Yes Prior Therapy Dates: present Prior Therapy Facilty/Provider(s): [facility]; [facility] Reason for Treatment: depression Does patient have an ACCT team?: Yes Does patient have Intensive In-House Services?  : No Does patient have [facility] services? : No Does patient have P4CC services?: No  Alcohol Screening: 1. How often do you have a drink containing alcohol?: Never 2. How many drinks containing alcohol do you have on a typical day when you are drinking?: 1 or 2 3. How often do you have six or more drinks on one occasion?: Never AUDIT-C Score: 0 4. How often during the last year have you found that you were not able to stop drinking once you had started?: Never 5. How often during the last year have you failed to do what was normally expected from you becasue of drinking?: Never 6. How often during the last year have you needed a first drink in the morning to get yourself going after a heavy drinking session?: Never 7. How often during the last year have you had a feeling of guilt of remorse  after drinking?: Never 8. How often during the last year have you been unable to remember what happened the night before because you had been drinking?: Never 9. Have you or someone else been injured as a result of your drinking?: No 10. Has a relative or friend or a doctor or another health worker been concerned about your drinking or suggested you cut down?: No Alcohol Use Disorder Identification Test Final Score (AUDIT):  0 Alcohol Brief Interventions/Follow-up: AUDIT Score <7 follow-up not indicated Substance Abuse History in the last 12 months:  No. Consequences of Substance Abuse: NA Previous Psychotropic Medications: Yes  Psychological Evaluations: Yes  Past Medical History:  Past Medical History:  Diagnosis Date  . Anxiety   . Bipolar 1 disorder ([place])   . Cognitive deficits   . Depression   . Diabetes mellitus without complication ([place])   . Hypertension   . Mental disorder   . Mental health disorder   . Obesity     Past Surgical History:  Procedure Laterality Date  . CESAREAN SECTION    . CESAREAN SECTION N/A 04/25/2013   Procedure: REPEAT CESAREAN SECTION;  Surgeon: [name], MD;  Location: [facility];  Service: Obstetrics;  Laterality: N/A;  . MASS EXCISION N/A 06/03/2012   Procedure: EXCISION MASS;  Surgeon: [name], MD;  Location: [place];  Service: ENT;  Laterality: N/A;  Excision uvula mass  . TONSILLECTOMY N/A 06/03/2012   Procedure: TONSILLECTOMY;  Surgeon: [name], MD;  Location: [place];  Service: ENT;  Laterality: N/A;  . TONSILLECTOMY     Family History:  Family History  Problem Relation Age of Onset  . Hypertension Mother   . Diabetes Father    Family Psychiatric History: Unknown Tobacco Screening:   Social History:  Social History   Substance and Sexual Activity  Alcohol Use Never     Social History   Substance and Sexual Activity  Drug Use Never   Comment: Patient denies    Additional Social History: Marital status: Single    Pain Medications: See MAR Prescriptions: See MAR Over the Counter: See MAR History of alcohol / drug use?: Yes Withdrawal Symptoms: Patient aware of relationship between substance abuse and physical/medical complications Name of Substance 1: Alcohol 1 - Age of First Use: 20s 1 - Amount (size/oz): 48 oz beer 1 - Frequency: daily 1 - Duration: ongoing 1 - Last Use / Amount:  05/18/19                  Allergies:   Allergies  Allergen Reactions  . Wellbutrin [Bupropion] Shortness Of Breath  . Omnipaque [Iohexol] Swelling and Other (See Comments)    Reaction:  Eye swelling  . Penicillins Hives and Other (See Comments)    Has patient had a PCN reaction causing immediate rash, facial/tongue/throat swelling, SOB or lightheadedness with hypotension: Unknown Has patient had a PCN reaction causing severe rash involving mucus membranes or skin necrosis: Yes Has patient had a PCN reaction that required hospitalization Unknown Has patient had a PCN reaction occurring within the last 10 years: No If all of the above answers are "NO", then may proceed with Cephalosporin use.  Herma Mering [Benztropine]     Make pt feel crazy  . Depakote Er [Divalproex Sodium Er] Nausea And Vomiting  . Divalproex Sodium   . Iohexol    Lab Results:  Results for orders placed or performed during the hospital encounter of 05/18/19 (from the past 48 hour(s))  Respiratory Panel by RT PCR (Flu A&B, Covid) - Nasopharyngeal Swab     Status: None   Collection Time: 05/18/19  8:42 PM   Specimen: Nasopharyngeal Swab  Result Value Ref Range   SARS Coronavirus 2 by RT PCR NEGATIVE NEGATIVE    Comment: (NOTE) SARS-CoV-2 target nucleic acids are NOT DETECTED. The SARS-CoV-2 RNA is generally detectable in upper respiratoy specimens during the acute phase of infection. The lowest concentration of SARS-CoV-2 viral copies this assay can detect is 131 copies/mL. A negative result does not preclude SARS-Cov-2 infection and should not be used as the sole basis for treatment or other patient management decisions. A negative result may occur with  improper specimen collection/handling, submission of specimen other than nasopharyngeal swab, presence of viral mutation(s) within the areas targeted by this assay, and inadequate number of viral copies (<131 copies/mL). A negative result must be combined  with clinical observations, patient history, and epidemiological information. The expected result is Negative. Fact Sheet for Patients:  PinkCheek.be Fact Sheet for Healthcare Providers:  GravelBags.it This test is not yet ap proved or cleared by the [place] FDA and  has been authorized for detection and/or diagnosis of SARS-CoV-2 by FDA under an Emergency Use Authorization (EUA). This EUA will remain  in effect (meaning this test can be used) for the duration of the COVID-19 declaration under Section 564(b)(1) of the Act, 21 U.S.C. section 360bbb-3(b)(1), unless the authorization is terminated or revoked sooner.    Influenza A by PCR NEGATIVE NEGATIVE   Influenza B by PCR NEGATIVE NEGATIVE    Comment: (NOTE) The Xpert Xpress SARS-CoV-2/FLU/RSV assay is intended as an aid in  the diagnosis of influenza from Nasopharyngeal swab specimens and  should not be used as a sole basis for treatment. Nasal washings and  aspirates are unacceptable for Xpert Xpress SARS-CoV-2/FLU/RSV  testing. Fact Sheet for Patients: PinkCheek.be Fact Sheet for Healthcare Providers: GravelBags.it This test is not yet approved or cleared by the [place] FDA and  has been authorized for detection and/or diagnosis of SARS-CoV-2 by  FDA under an Emergency Use Authorization (EUA). This EUA will remain  in effect (meaning this test can be used) for the duration of the  Covid-19 declaration under Section 564(b)(1) of the Act, 21  U.S.C. section 360bbb-3(b)(1), unless the authorization is  terminated or revoked. Performed at [facility], [place] [address], [place], [place] [address]     Blood Alcohol level:  Lab Results  Component Value Date   [facility] <10 04/27/2019   ETH <10 [id number]    Metabolic Disorder Labs:  Lab Results  Component Value Date   HGBA1C 5.4  10/11/2018   MPG 108.28 10/11/2018   MPG 111.15 09/26/2018   Lab Results  Component Value Date   PROLACTIN 5.2 10/11/2018   PROLACTIN 36.7 (H) 12/01/2017   Lab Results  Component Value Date   CHOL 195 10/11/2018   TRIG 127 10/11/2018   HDL 49 10/11/2018   CHOLHDL 4.0 10/11/2018   VLDL 25 10/11/2018   LDLCALC NOT CALCULATED 10/11/2018   LDLCALC 94 05/04/2018    Current Medications: No current facility-administered medications for this encounter.   PTA Medications: Medications Prior to Admission  Medication Sig Dispense Refill Last Dose  . ABILIFY MAINTENA 400 MG PRSY prefilled syringe Inject 400 mg into the muscle every 28 (twenty-eight) days. Next dose on 05/30/2018 1 each 6 Unknown at Unknown time  . clomiPRAMINE (ANAFRANIL) 25 MG capsule Take 25 mg by mouth  2 (two) times daily.    Unknown at Unknown time  . hydrochlorothiazide (HYDRODIURIL) 25 MG tablet Take 25 mg by mouth daily.   Unknown at Unknown time  . insulin detemir (LEVEMIR) 100 UNIT/ML injection Inject 40 Units into the skin 2 (two) times daily.    Unknown at Unknown time  . lithium carbonate (ESKALITH) 450 MG CR tablet Take 900 mg by mouth at bedtime.    Unknown at Unknown time  . medroxyPROGESTERone Acetate (DEPO-PROVERA IM) Inject into the muscle every 3 (three) months.   Unknown at Unknown time  . ondansetron (ZOFRAN) 4 MG tablet Take 1 tablet (4 mg total) by mouth daily as needed for nausea or vomiting. 2 tablet 0 Unknown at Unknown time  . potassium chloride (K-DUR) 10 MEQ tablet Take 1 tablet (10 mEq total) by mouth daily. 5 tablet 0 Unknown at Unknown time  . propranolol (INDERAL) 10 MG tablet Take 10 mg by mouth 2 (two) times a day.   Unknown at Unknown time    Musculoskeletal: Strength & Muscle Tone: within normal limits Gait & Station: normal Patient leans: N/A  Psychiatric Specialty Exam: Physical Exam  Constitutional: She is oriented to person, place, and time. She appears well-developed and  well-nourished.  HENT:  Head: Normocephalic.  Eyes: Pupils are equal, round, and reactive to light.  Respiratory: Effort normal.  Musculoskeletal:        General: Normal range of motion.     Cervical back: Normal range of motion.  Neurological: She is alert and oriented to person, place, and time.  Skin: Skin is warm and dry.  Psychiatric: Her speech is normal and behavior is normal. Judgment normal. Her mood appears anxious. Thought content is paranoid. Cognition and memory are normal.    Review of Systems  Psychiatric/Behavioral: Positive for behavioral problems (paranoia). Negative for agitation, confusion and suicidal ideas. The patient is not nervous/anxious and is not hyperactive.   All other systems reviewed and are negative.   Blood pressure 136/74, pulse (!) 111, temperature 98.2 F (36.8 C), temperature source Oral, resp. rate 14, SpO2 100 %.There is no height or weight on file to calculate BMI.  General Appearance: Casual  Eye Contact:  Minimal  Speech:  Normal Rate  Volume:  Normal  Mood:  Anxious  Affect:  Non-Congruent  Thought Process:  Coherent and Descriptions of Associations: Intact  Orientation:  Full (Time, Place, and Person)  Thought Content:  WDL  Suicidal Thoughts:  No  Homicidal Thoughts:  No  Memory:  Recent;   Good  Judgement:  Fair  Insight:  Fair  Psychomotor Activity:  Normal  Concentration:  Concentration: Fair  Recall:  Good  Fund of Knowledge:  Good  Language:  Good  Akathisia:  No  Handed:  Right  AIMS (if indicated):     Assets:  Communication Skills Desire for Improvement Financial Resources/Insurance Housing  ADL's:  Intact  Cognition:  WNL  Sleep:      Disposition: Supportive therapy provided about ongoing stressors. Recommend overnight observation for monitoring and stabilization.  Treatment Plan Summary: Daily contact with patient to assess and evaluate symptoms and progress in treatment and Medication  management  Observation Level/Precautions:  15 minute checks Laboratory:  Chemistry Profile UDS Psychotherapy:   Medications:   Consultations:   Discharge Concerns:   Estimated LOS: Other:      Mliss Fritz, NP 1/21/202111:11 PM

## 2019-05-18 NOTE — Progress Notes (Signed)
Per Talbot Grumbling, NP recommends overnight observation for safety and stabilization and to be reassessed in the AM by psych.  Lind Covert, MSW, LCSW Therapeutic Triage Specialist  [phone]

## 2019-05-18 NOTE — Plan of Care (Signed)
[name] Observation Crisis Plan  Reason for Crisis Plan:  Crisis Stabilization   Plan of Care:  Referral for Inpatient Hospitalization and Referral for IOP  Family Support:      Current Living Environment:  Living Arrangements: Other (Comment)(roommates)  Insurance:   Hospital Account    Name Acct ID Class Status Primary Coverage   [name], [name] [id number] [place]        Guarantor Account (for Hospital Account [id number])    Name Relation to Pt Service Area Active? Acct Type   [name] Self [facility] Yes Behavioral Health   Address Phone       [address] [place], [place] [address] [phone])          Coverage Information (for Hospital Account [id number])    F/O Payor/Plan Precert #   [facility] MEDICAID/[place] MEDICA[place]    Subscriber Subscriber #   Stacy, [name] [id number]   Address Phone   [address] [place], [place] [address] [phone]      Legal Guardian:     Primary Care Provider:  Benito Mccreedy, MD  Current Outpatient Providers:  ACT  Psychiatrist:  Name of Psychiatrist: [facility]  Counselor/Therapist:  Name of Therapist: ACT Team -- [facility]  Compliant with Medications:  No  Additional Information:   [name] 1/21/20219:48 PM

## 2019-05-18 NOTE — Progress Notes (Signed)
Patient ID: [name], female   DOB: [birth year], 29 y.o.   MRN: [id number] Pt A&O x 4, presents with complaint of not taking her medications for the past 2 days.  Pt acting erratic today and 911 called to her home.  Pt has an ACTT team.  Pt reports feeling paranoid, like people are out to get her and talking badly about her.  Denies SI, HI or AVH.  Pt has a history of SI attempts.  Pt also admits to waking early and walking the streets all day.  Pt calm & cooperative, skin search completed.  Pending COVID results.  Monitoring for safety.

## 2019-05-18 NOTE — Progress Notes (Signed)
Pt transferred from 200 hall to 400 -1. Pt ambulatory and stable, alert and oriented x 4, denies SI/HI, AVH at this time, vitals signs monitored and stable, CBG 119 mg/dl. Pt oriented to the unit, offered drink and retired to bed, will continued to monitor.

## 2019-05-19 DIAGNOSIS — F319 Bipolar disorder, unspecified: Secondary | ICD-10-CM | POA: Diagnosis not present

## 2019-05-19 LAB — LIPID PANEL
Cholesterol: 172 mg/dL (ref 0–200)
HDL: 45 mg/dL (ref >40)
LDL Cholesterol: 112 mg/dL — ABNORMAL HIGH (ref 0–99)
Total CHOL/HDL Ratio: 3.8 RATIO
Triglycerides: 75 mg/dL (ref <150)
VLDL: 15 mg/dL (ref 0–40)

## 2019-05-19 LAB — TSH: TSH: 0.699 u[IU]/mL (ref 0.350–4.500)

## 2019-05-19 LAB — COMPREHENSIVE METABOLIC PANEL
ALT: 18 U/L (ref 0–44)
AST: 17 U/L (ref 15–41)
Albumin: 3.7 g/dL (ref 3.5–5.0)
Alkaline Phosphatase: 35 U/L — ABNORMAL LOW (ref 38–126)
Anion gap: 8 (ref 5–15)
BUN: 12 mg/dL (ref 6–20)
CO2: 25 mmol/L (ref 22–32)
Calcium: 8.8 mg/dL — ABNORMAL LOW (ref 8.9–10.3)
Chloride: 109 mmol/L (ref 98–111)
Creatinine, Ser: 0.63 mg/dL (ref 0.44–1.00)
GFR calc Af Amer: >60 mL/min (ref >60)
GFR calc non Af Amer: >60 mL/min (ref >60)
Glucose, Bld: 113 mg/dL — ABNORMAL HIGH (ref 70–99)
Potassium: 3.6 mmol/L (ref 3.5–5.1)
Sodium: 142 mmol/L (ref 135–145)
Total Bilirubin: 0.8 mg/dL (ref 0.3–1.2)
Total Protein: 6.5 g/dL (ref 6.5–8.1)

## 2019-05-19 LAB — RAPID URINE DRUG SCREEN, HOSP PERFORMED
Amphetamines: NOT DETECTED
Barbiturates: NOT DETECTED
Benzodiazepines: NOT DETECTED
Cocaine: NOT DETECTED
Opiates: NOT DETECTED
Tetrahydrocannabinol: NOT DETECTED

## 2019-05-19 LAB — URINALYSIS, COMPLETE (UACMP) WITH MICROSCOPIC
Bacteria, UA: NONE SEEN
Bilirubin Urine: NEGATIVE
Glucose, UA: NEGATIVE mg/dL
Hgb urine dipstick: NEGATIVE
Ketones, ur: 5 mg/dL — AB
Leukocytes,Ua: NEGATIVE
Nitrite: NEGATIVE
Protein, ur: NEGATIVE mg/dL
Specific Gravity, Urine: 1.031 — ABNORMAL HIGH (ref 1.005–1.030)
pH: 5 (ref 5.0–8.0)

## 2019-05-19 LAB — HEPATIC FUNCTION PANEL
ALT: 18 U/L (ref 0–44)
AST: 17 U/L (ref 15–41)
Albumin: 3.7 g/dL (ref 3.5–5.0)
Alkaline Phosphatase: 32 U/L — ABNORMAL LOW (ref 38–126)
Bilirubin, Direct: 0.1 mg/dL (ref 0.0–0.2)
Indirect Bilirubin: 0.6 mg/dL (ref 0.3–0.9)
Total Bilirubin: 0.7 mg/dL (ref 0.3–1.2)
Total Protein: 6.6 g/dL (ref 6.5–8.1)

## 2019-05-19 LAB — GLUCOSE, CAPILLARY
Glucose-Capillary: 75 mg/dL (ref 70–99)
Glucose-Capillary: 126 mg/dL — ABNORMAL HIGH (ref 70–99)

## 2019-05-19 LAB — HEMOGLOBIN A1C
Hgb A1c MFr Bld: 5.2 % (ref 4.8–5.6)
Mean Plasma Glucose: 102.54 mg/dL

## 2019-05-19 LAB — MAGNESIUM: Magnesium: 1.9 mg/dL (ref 1.7–2.4)

## 2019-05-19 LAB — ETHANOL: Alcohol, Ethyl (B): <10 mg/dL (ref <10)

## 2019-05-19 MED ORDER — PERPHENAZINE 2 MG PO TABS
6.0000 mg | ORAL_TABLET | Freq: Two times a day (BID) | ORAL | Status: DC
  Administered 2019-05-19: 08:00:00 6 mg via ORAL
  Filled 2019-05-19: qty 3

## 2019-05-19 MED ORDER — PERPHENAZINE 2 MG PO TABS: 6.0000 mg | ORAL_TABLET | Freq: Two times a day (BID) | ORAL | Status: DC

## 2019-05-19 MED ORDER — CLONAZEPAM 1 MG PO TABS
2.0000 mg | ORAL_TABLET | Freq: Once | ORAL | Status: AC
  Administered 2019-05-19: 2 mg via ORAL
  Filled 2019-05-19: qty 2

## 2019-05-19 MED ORDER — PERPHENAZINE 2 MG PO TABS: 6.0000 mg | ORAL_TABLET | Freq: Two times a day (BID) | ORAL | 0 refills | Status: DC

## 2019-05-19 NOTE — Progress Notes (Signed)
Patient observed on unit yelling and singing. Patient impulsive and labile. Patient is redirectable. Patient denies SI/HI and A/V hallucinations. Patient provided support and encouragement. Q 15 minute checks in progress and patient remains safe on unit.

## 2019-05-19 NOTE — BH Assessment (Signed)
[facility] Assessment Progress Note  Per Sheran Fava, this pt does not require psychiatric hospitalization at this time.  Pt is to be discharged from the [facility] Observation Unit with recommendation to continue treatment with the [facility] ACT Team.  This has been included in pt's discharge instructions.  At 11:16 I called [facility] and spoke to [facility].  She agrees to consult with ACT Team staff to determine whether someone will be able to pick pt up.  She understands that pt is to be discharged no later than 15:00, but agrees to return my call.  Pt's nurse has been notified.  Jalene Mullet, [place] Triage Specialist [phone]

## 2019-05-19 NOTE — Discharge Instructions (Signed)
For your behavioral health needs, you are advised to continue treatment with the [facility] ACT Team: ° °     [facility] ACT Team °     [facility], [address] °     [address] °     [place], [place]  [address] °     [phone] °     Crisis number: [phone] °

## 2019-05-19 NOTE — Progress Notes (Signed)
CSW called [employer] and coordinated a ride home for the patient. It will be scheduled for 4:30pm. CSW provided RN's telephone number so the driver can call when they arrive.   CSW is signing off per the patient is discharging home. If any new needs arise, please re-consult Education officer, museum.   Domenic Schwab, MSW, LCSW-A Clinical Disposition Social Worker [employer] [facility]/TTS [phone]

## 2019-05-19 NOTE — Progress Notes (Addendum)
   05/19/19 1600  Vital Signs  Temp 98 F (36.7 C)  Temp Source Oral  Pulse Rate 98  Pulse Rate Source Dinamap  Resp 18  BP 129/77  BP Location Left Arm  BP Method Automatic  Patient Position (if appropriate) Sitting  Oxygen Therapy  SpO2 100 %  O2 Device Room Air  Patient discharged home. Patient alert, oriented and ambulatory at discharge. AVS/Follow-up/Prescriptions reviewed with patient and written copies given to patient. Patient aware to pick her medications up from her pharmacy. Patient denies SI/HI and A/V hallucinations at discharged. All patient belongings returned to her at discharge and she verified receipt. Patient denies pain.  Patient escorted off unit by staff to meet cab provided by  [facility]  To take patient home.

## 2019-05-19 NOTE — Discharge Summary (Addendum)
The day of discharge, the patient was evaluated by the attending MD and was discussed with the multidisciplinary treatment team. The treatment team has determined the patient to be stable and appropriate for discharge with medical approval. Day of discharge assessment included a suicide and violence risk assessment. Risk factors for self-harm/suicide and violence that are present at time of discharge include: younger age, prior expressed suicidal ideation, low intellectual functioning. These risk factors are mitigated by the following factors:well known to mental health system, available outpatient providers and has an ACT team, presence of follow-up plan. Furthermore, the treatment team has attempted to mitigate risk through supportive psychotherapy, providing psycho-education, thoughtful medication management, and communication with outpatient providers for continuity of care. The patient was educated about relevant modifiable risk factors including following recommendations for treatment of psychiatric illness and abstaining from substance abuse.   While future psychiatric events cannot be accurately predicted, the patient does not currently require further acute inpatient psychiatric care and does not currently meet [facility] involuntary commitment criteria. It is recommended that the patient continue treatment in outpatient care. A follow up plan and crisis plan are in place, have been discussed with the patient, and the patient agrees to the plan at time of discharge. Will discharge at this time.

## 2019-05-20 LAB — PROLACTIN: Prolactin: 5.3 ng/mL (ref 4.8–23.3)

## 2019-05-29 ENCOUNTER — Other Ambulatory Visit: Payer: Self-pay

## 2019-05-29 ENCOUNTER — Inpatient Hospital Stay (HOSPITAL_COMMUNITY)
Admission: RE | Admit: 2019-05-29 | Discharge: 2019-06-02 | DRG: 885 | Disposition: A | Discharge status: Home / Self Care | Payer: Medicaid Other | Attending: Psychiatry | Admitting: Psychiatry

## 2019-05-29 ENCOUNTER — Encounter (HOSPITAL_COMMUNITY): Payer: Self-pay | Admitting: Nurse Practitioner

## 2019-05-29 DIAGNOSIS — E119 Type 2 diabetes mellitus without complications: Secondary | ICD-10-CM | POA: Diagnosis present

## 2019-05-29 DIAGNOSIS — G4700 Insomnia, unspecified: Secondary | ICD-10-CM | POA: Diagnosis present

## 2019-05-29 DIAGNOSIS — F419 Anxiety disorder, unspecified: Secondary | ICD-10-CM | POA: Diagnosis present

## 2019-05-29 DIAGNOSIS — Z880 Allergy status to penicillin: Secondary | ICD-10-CM

## 2019-05-29 DIAGNOSIS — Z79899 Other long term (current) drug therapy: Secondary | ICD-10-CM

## 2019-05-29 DIAGNOSIS — Z8249 Family history of ischemic heart disease and other diseases of the circulatory system: Secondary | ICD-10-CM

## 2019-05-29 DIAGNOSIS — R45851 Suicidal ideations: Secondary | ICD-10-CM | POA: Diagnosis present

## 2019-05-29 DIAGNOSIS — Z915 Personal history of self-harm: Secondary | ICD-10-CM

## 2019-05-29 DIAGNOSIS — Z833 Family history of diabetes mellitus: Secondary | ICD-10-CM

## 2019-05-29 DIAGNOSIS — F250 Schizoaffective disorder, bipolar type: Principal | ICD-10-CM | POA: Diagnosis present

## 2019-05-29 DIAGNOSIS — Z20822 Contact with and (suspected) exposure to covid-19: Secondary | ICD-10-CM | POA: Diagnosis present

## 2019-05-29 DIAGNOSIS — Z794 Long term (current) use of insulin: Secondary | ICD-10-CM

## 2019-05-29 DIAGNOSIS — Z888 Allergy status to other drugs, medicaments and biological substances status: Secondary | ICD-10-CM

## 2019-05-29 DIAGNOSIS — F1010 Alcohol abuse, uncomplicated: Secondary | ICD-10-CM | POA: Diagnosis present

## 2019-05-29 DIAGNOSIS — F429 Obsessive-compulsive disorder, unspecified: Secondary | ICD-10-CM | POA: Diagnosis present

## 2019-05-29 DIAGNOSIS — I10 Essential (primary) hypertension: Secondary | ICD-10-CM | POA: Diagnosis present

## 2019-05-29 DIAGNOSIS — R4189 Other symptoms and signs involving cognitive functions and awareness: Secondary | ICD-10-CM | POA: Diagnosis present

## 2019-05-29 DIAGNOSIS — F17210 Nicotine dependence, cigarettes, uncomplicated: Secondary | ICD-10-CM | POA: Diagnosis present

## 2019-05-29 LAB — RESPIRATORY PANEL BY RT PCR (FLU A&B, COVID)
Influenza A by PCR: NEGATIVE
Influenza B by PCR: NEGATIVE
SARS Coronavirus 2 by RT PCR: NEGATIVE

## 2019-05-29 LAB — GLUCOSE, CAPILLARY: Glucose-Capillary: 169 mg/dL — ABNORMAL HIGH (ref 70–99)

## 2019-05-29 MED ORDER — PROPRANOLOL HCL 10 MG PO TABS
10.0000 mg | ORAL_TABLET | Freq: Two times a day (BID) | ORAL | Status: DC
  Administered 2019-05-29 – 2019-06-02 (×7): 10 mg via ORAL
  Filled 2019-05-29 (×13): qty 1

## 2019-05-29 MED ORDER — ACETAMINOPHEN 325 MG PO TABS: 650.0000 mg | ORAL_TABLET | Freq: Four times a day (QID) | ORAL | Status: DC | PRN

## 2019-05-29 MED ORDER — INSULIN DETEMIR 100 UNIT/ML ~~LOC~~ SOLN
40.0000 [IU] | Freq: Two times a day (BID) | SUBCUTANEOUS | Status: DC
  Administered 2019-05-29 – 2019-06-02 (×7): 40 [IU] via SUBCUTANEOUS

## 2019-05-29 MED ORDER — ALUM & MAG HYDROXIDE-SIMETH 200-200-20 MG/5ML PO SUSP: 30.0000 mL | ORAL | Status: DC | PRN

## 2019-05-29 MED ORDER — HYDROXYZINE HCL 25 MG PO TABS
25.0000 mg | ORAL_TABLET | Freq: Three times a day (TID) | ORAL | Status: DC | PRN
  Administered 2019-05-31 – 2019-06-01 (×3): 25 mg via ORAL
  Filled 2019-05-29 (×4): qty 1

## 2019-05-29 MED ORDER — PERPHENAZINE 2 MG PO TABS
6.0000 mg | ORAL_TABLET | Freq: Two times a day (BID) | ORAL | Status: DC
  Administered 2019-05-29 – 2019-05-30 (×2): 6 mg via ORAL
  Filled 2019-05-29 (×2): qty 3

## 2019-05-29 MED ORDER — MAGNESIUM HYDROXIDE 400 MG/5ML PO SUSP: 30.0000 mL | Freq: Every day | ORAL | Status: DC | PRN

## 2019-05-29 MED ORDER — LITHIUM CARBONATE ER 450 MG PO TBCR
900.0000 mg | EXTENDED_RELEASE_TABLET | Freq: Every day | ORAL | Status: DC
  Administered 2019-05-29 – 2019-06-01 (×4): 900 mg via ORAL
  Filled 2019-05-29 (×6): qty 2
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
## 2019-05-29 NOTE — H&P (Signed)
[facility] Provider Admission PAA/H&P  Patient Identification: [name] MRN:  [id number] Date of Evaluation:  05/29/2019 Chief Complaint:  Schizoaffective disorder, bipolar type ([place]) [F25.0] Principal Diagnosis: Schizoaffective disorder, bipolar type ([place]) Diagnosis:  Principal Problem:   Schizoaffective disorder, bipolar type ([place]) Active Problems:   Suicidal ideation  History of Present Illness:  TTS Assessment:  [name] is an 29 y.o. female, who presents voluntary and unaccompanied to [facility]. Clinician asked the pt, "what brought you to the hospital?" Pt reported, hearing people talking about her, calling her "crazy, old ass, a bald headed bitch." Pt reported, seeing and heating people laugh at her. Pt reported, feeling paranoid, [place] Pt reported, she has OCD, that is why she shaves he head. Pt reported, hearing the voices and seeing people laugh at her made her feel suicidal. Pt reported, five previous suicide attempts. Pt reported, she was going to take medications for Schizophrenia but she called her ACT Team who recommended she come to [facility]. Pt reported, her ACT Team arranged she moved from a motel to [employer], with the goal of independent living (pt's own place) in two months. Pt denies, HI and self-injurious behaviors and access to weapons.    Pt  reported, smoking five packs of cigarettes, daily. Pt reported, drinking once Four Loco and three Mickey's (beers), yesterday. Pt is [facility], for medication management. Per chart, pt has previous inpatient admissions.  Pt presents alert with loud speech. Pt's eye contact was good. Pt's mood was pleasant, anxious. Pt's affect was congruent with mood. Pt's thought process was coherent, relevant. Pt's judgement was partial. Pt was oriented x4. Pt's concentration normal. Pt's insight and impulse control was fair. Pt reported,  if discharged from [facility] she could not contract for safety, (she would hurt herself.)   *Pt declined clinician to contact anyone (family, friend supports) to obtain collateral information.*  Evaluation on Unit: Reviewed TTS assessment and validated with patient. Reports that she takes lithium and perphenazine as prescribed. Reports that she takes levemir 40 units BID, she has not taken insulin today.  On evaluation patient is alert and oriented x 4, pleasant, and cooperative. Speech is clear and coherent. Mood is depressed and affect is congruent with mood. Thought process is coherent. She reports feeling paranoid. States that she has auditory hallucinations. States that one of the voices told her to shave her head. No indication that patient is responding to internal stimuli. Denies homicidal ideations. Denies substance abuse.    Associated Signs/Symptoms: Depression Symptoms:  depressed mood, insomnia, feelings of worthlessness/guilt, suicidal thoughts with specific plan, anxiety, (Hypo) Manic Symptoms:  Irritable Mood, Anxiety Symptoms:  Excessive Worry, Psychotic Symptoms:  Hallucinations: Auditory Paranoia, PTSD Symptoms: NA Total Time spent with patient: 30 minutes  Past Psychiatric History: Schizoaffective Disorder  Is the patient at risk to self? Yes.    Has the patient been a risk to self in the past 6 months? No.  Has the patient been a risk to self within the distant past? Yes.    Is the patient a risk to others? No.  Has the patient been a risk to others in the past 6 months? No.  Has the patient been a risk to others within the distant past? No.   Prior Inpatient Therapy: Prior Inpatient Therapy: Yes Prior Therapy Dates: Per chart, "2020, 2019 and mult other admissions."  Prior Therapy Facilty/Provider(s): Per  chart, "[facility], [place]."  Reason for Treatment: Per chart, "Bipolar."  Prior Outpatient Therapy: Prior Outpatient Therapy: Yes Prior Therapy Dates:  Current. Prior Therapy Facilty/Provider(s): [facility]. Reason for Treatment: Medication management.  Does patient have an ACCT team?: Yes Does patient have Intensive In-House Services?  : No Does patient have [facility] services? : No Does patient have P4CC services?: No  Alcohol Screening:   Substance Abuse History in the last 12 months:  No. Consequences of Substance Abuse: NA Previous Psychotropic Medications: Yes  Psychological Evaluations: No  Past Medical History:  Past Medical History:  Diagnosis Date  . Anxiety   . Bipolar 1 disorder ([place])   . Cognitive deficits   . Depression   . Diabetes mellitus without complication ([place])   . Hypertension   . Mental disorder   . Mental health disorder   . Obesity     Past Surgical History:  Procedure Laterality Date  . CESAREAN SECTION    . CESAREAN SECTION N/A 04/25/2013   Procedure: REPEAT CESAREAN SECTION;  Surgeon: [name], MD;  Location: [facility];  Service: Obstetrics;  Laterality: N/A;  . MASS EXCISION N/A 06/03/2012   Procedure: EXCISION MASS;  Surgeon: [name], MD;  Location: [place];  Service: ENT;  Laterality: N/A;  Excision uvula mass  . TONSILLECTOMY N/A 06/03/2012   Procedure: TONSILLECTOMY;  Surgeon: [name], MD;  Location: [place];  Service: ENT;  Laterality: N/A;  . TONSILLECTOMY     Family History:  Family History  Problem Relation Age of Onset  . Hypertension Mother   . Diabetes Father    Family Psychiatric History: unknown Tobacco Screening:   Social History:  Social History   Substance and Sexual Activity  Alcohol Use Never     Social History   Substance and Sexual Activity  Drug Use Never   Comment: Patient denies    Additional Social History: Marital status: Single    Pain Medications: See MAR Prescriptions: See MAR Over the Counter: See MAR History of alcohol / drug use?: Yes Name of Substance 1: Cigarettes. 1  - Age of First Use: UTA 1 - Amount (size/oz): Pt reported, smoking five packs of cigerettes, daily. 1 - Frequency: Daily. 1 - Duration: Ongoing. 1 - Last Use / Amount: Daily. Name of Substance 2: Alcohol. 2 - Age of First Use: UTA 2 - Amount (size/oz): Pt reported, drinking once Four Loco and three Mickey's (beers), yesteday. 2 - Frequency: Ongoing. 2 - Duration: Ongoing. 2 - Last Use / Amount: Yesterday (05/28/2019).                Allergies:   Allergies  Allergen Reactions  . Wellbutrin [Bupropion] Shortness Of Breath  . Omnipaque [Iohexol] Swelling and Other (See Comments)    Reaction:  Eye swelling  . Penicillins Hives and Other (See Comments)    Has patient had a PCN reaction causing immediate rash, facial/tongue/throat swelling, SOB or lightheadedness with hypotension: Unknown Has patient had a PCN reaction causing severe rash involving mucus membranes or skin necrosis: Yes Has patient had a PCN reaction that required hospitalization Unknown Has patient had a PCN reaction occurring within the last 10 years: No If all of the above answers are "NO", then may proceed with Cephalosporin use.  Herma Mering [Benztropine]     Make pt feel crazy  . Depakote Er [Divalproex Sodium Er] Nausea And Vomiting  . Divalproex Sodium   . Iohexol  Lab Results: No results found for this or any previous visit (from the past 48 hour(s)).  Blood Alcohol level:  Lab Results  Component Value Date   ETH <10 05/19/2019   ETH <10 99991111    Metabolic Disorder Labs:  Lab Results  Component Value Date   HGBA1C 5.2 05/19/2019   MPG 102.54 05/19/2019   MPG 108.28 10/11/2018   Lab Results  Component Value Date   PROLACTIN 5.3 05/19/2019   PROLACTIN 5.2 10/11/2018   Lab Results  Component Value Date   CHOL 172 05/19/2019   TRIG 75 05/19/2019   HDL 45 05/19/2019   CHOLHDL 3.8 05/19/2019   VLDL 15 05/19/2019   LDLCALC 112 (H) 05/19/2019   LDLCALC NOT CALCULATED 10/11/2018     Current Medications: Current Facility-Administered Medications  Medication Dose Route Frequency Provider Last Rate Last Admin  . acetaminophen (TYLENOL) tablet 650 mg  650 mg Oral Q6H PRN Rozetta Nunnery, NP      . alum & mag hydroxide-simeth (MAALOX/MYLANTA) 200-200-20 MG/5ML suspension 30 mL  30 mL Oral Q4H PRN Lindon Romp A, NP      . hydrOXYzine (ATARAX/VISTARIL) tablet 25 mg  25 mg Oral TID PRN Rozetta Nunnery, NP      . insulin detemir (LEVEMIR) injection 40 Units  40 Units Subcutaneous BID Lindon Romp A, NP      . lithium carbonate (ESKALITH) CR tablet 900 mg  900 mg Oral QHS Lindon Romp A, NP      . magnesium hydroxide (MILK OF MAGNESIA) suspension 30 mL  30 mL Oral Daily PRN Lindon Romp A, NP      . perphenazine (TRILAFON) tablet 6 mg  6 mg Oral BID Lindon Romp A, NP      . propranolol (INDERAL) tablet 10 mg  10 mg Oral BID Lindon Romp A, NP       PTA Medications: Medications Prior to Admission  Medication Sig Dispense Refill Last Dose  . diphenhydrAMINE (SOMINEX) 25 MG tablet Take 25 mg by mouth at bedtime as needed for sleep.     . insulin detemir (LEVEMIR) 100 UNIT/ML injection Inject 40 Units into the skin 2 (two) times daily.      [place] Kitchen lithium carbonate (ESKALITH) 450 MG CR tablet Take 900 mg by mouth at bedtime.      [place] Kitchen perphenazine (TRILAFON) 2 MG tablet Take 3 tablets (6 mg total) by mouth 2 (two) times daily. 30 tablet 0   . propranolol (INDERAL) 10 MG tablet Take 10 mg by mouth 2 (two) times a day.       Musculoskeletal: Strength & Muscle Tone: within normal limits Gait & Station: normal Patient leans: N/A  Psychiatric Specialty Exam: Physical Exam  Constitutional: She is oriented to person, place, and time. She appears well-developed and well-nourished. No distress.  HENT:  Head: Normocephalic and atraumatic.  Right Ear: External ear normal.  Left Ear: External ear normal.  Eyes: Pupils are equal, round, and reactive to light. Right eye exhibits no  discharge. Left eye exhibits no discharge.  Cardiovascular: Normal rate.  Respiratory: Effort normal. No respiratory distress.  Musculoskeletal:        General: Normal range of motion.  Neurological: She is alert and oriented to person, place, and time.  Skin: Skin is warm and dry. She is not diaphoretic.  Psychiatric: Her mood appears anxious. She is not withdrawn and not actively hallucinating. Thought content is paranoid. She exhibits a depressed mood. She expresses suicidal ideation. She  expresses no homicidal ideation. She expresses suicidal plans.    Review of Systems  Constitutional: Negative for activity change, appetite change, chills, diaphoresis, fatigue, fever and unexpected weight change.  Cardiovascular: Negative for chest pain.  Gastrointestinal: Negative for diarrhea, nausea and vomiting.  Psychiatric/Behavioral: Positive for dysphoric mood, hallucinations, sleep disturbance and suicidal ideas. The patient is nervous/anxious.   All other systems reviewed and are negative.   Blood pressure (!) 140/98, pulse 82, temperature 98.4 F (36.9 C), temperature source Oral, resp. rate 16.There is no height or weight on file to calculate BMI.  General Appearance: Disheveled  Eye Contact:  Good  Speech:  Clear and Coherent and Normal Rate  Volume:  Normal  Mood:  Anxious, Depressed and Worthless  Affect:  Congruent and Depressed  Thought Process:  Coherent and Descriptions of Associations: Intact  Orientation:  Full (Time, Place, and Person)  Thought Content:  Paranoid Ideation  Suicidal Thoughts:  Yes.  with intent/plan  Homicidal Thoughts:  No  Memory:  Immediate;   Good Recent;   Good  Judgement:  Fair  Insight:  Fair  Psychomotor Activity:  Normal  Concentration:  Concentration: Good  Recall:  Good  Fund of Knowledge:  Good  Language:  Good  Akathisia:  Negative  Handed:  Right  AIMS (if indicated):     Assets:  Communication Skills Desire for Improvement Leisure  Time  ADL's:  Intact  Cognition:  WNL  Sleep:         Treatment Plan Summary: Daily contact with patient to assess and evaluate symptoms and progress in treatment and Medication management  Observation Level/Precautions:  15 minute checks Laboratory:  CBC Chemistry Profile UDS Psychotherapy:  Individual Medications:   Resumed home medications Lithium 900 mg QHS for mood stability perphenazine 6 mg BID for schizoaffective disorder levemir 40 units BID for DM  Consultations: as needed  Discharge Concerns:  Safety, housing Estimated LOS: Other:      Rozetta Nunnery, NP 2/1/202110:27 PM

## 2019-05-30 DIAGNOSIS — R45851 Suicidal ideations: Secondary | ICD-10-CM | POA: Diagnosis present

## 2019-05-30 DIAGNOSIS — Z833 Family history of diabetes mellitus: Secondary | ICD-10-CM | POA: Diagnosis not present

## 2019-05-30 DIAGNOSIS — G4700 Insomnia, unspecified: Secondary | ICD-10-CM | POA: Diagnosis present

## 2019-05-30 DIAGNOSIS — Z915 Personal history of self-harm: Secondary | ICD-10-CM | POA: Diagnosis not present

## 2019-05-30 DIAGNOSIS — E119 Type 2 diabetes mellitus without complications: Secondary | ICD-10-CM | POA: Diagnosis present

## 2019-05-30 DIAGNOSIS — Z880 Allergy status to penicillin: Secondary | ICD-10-CM | POA: Diagnosis not present

## 2019-05-30 DIAGNOSIS — F17210 Nicotine dependence, cigarettes, uncomplicated: Secondary | ICD-10-CM | POA: Diagnosis present

## 2019-05-30 DIAGNOSIS — Z888 Allergy status to other drugs, medicaments and biological substances status: Secondary | ICD-10-CM | POA: Diagnosis not present

## 2019-05-30 DIAGNOSIS — F250 Schizoaffective disorder, bipolar type: Secondary | ICD-10-CM | POA: Diagnosis present

## 2019-05-30 DIAGNOSIS — F419 Anxiety disorder, unspecified: Secondary | ICD-10-CM | POA: Diagnosis present

## 2019-05-30 DIAGNOSIS — Z794 Long term (current) use of insulin: Secondary | ICD-10-CM | POA: Diagnosis not present

## 2019-05-30 DIAGNOSIS — Z8249 Family history of ischemic heart disease and other diseases of the circulatory system: Secondary | ICD-10-CM | POA: Diagnosis not present

## 2019-05-30 DIAGNOSIS — Z79899 Other long term (current) drug therapy: Secondary | ICD-10-CM | POA: Diagnosis not present

## 2019-05-30 DIAGNOSIS — Z20822 Contact with and (suspected) exposure to covid-19: Secondary | ICD-10-CM | POA: Diagnosis present

## 2019-05-30 DIAGNOSIS — F1010 Alcohol abuse, uncomplicated: Secondary | ICD-10-CM | POA: Diagnosis present

## 2019-05-30 DIAGNOSIS — R4189 Other symptoms and signs involving cognitive functions and awareness: Secondary | ICD-10-CM | POA: Diagnosis present

## 2019-05-30 DIAGNOSIS — I10 Essential (primary) hypertension: Secondary | ICD-10-CM | POA: Diagnosis present

## 2019-05-30 DIAGNOSIS — F429 Obsessive-compulsive disorder, unspecified: Secondary | ICD-10-CM | POA: Diagnosis present

## 2019-05-30 LAB — COMPREHENSIVE METABOLIC PANEL
ALT: 20 U/L (ref 0–44)
AST: 20 U/L (ref 15–41)
Albumin: 3.6 g/dL (ref 3.5–5.0)
Alkaline Phosphatase: 28 U/L — ABNORMAL LOW (ref 38–126)
Anion gap: 6 (ref 5–15)
BUN: 7 mg/dL (ref 6–20)
CO2: 26 mmol/L (ref 22–32)
Calcium: 8.8 mg/dL — ABNORMAL LOW (ref 8.9–10.3)
Chloride: 109 mmol/L (ref 98–111)
Creatinine, Ser: 0.74 mg/dL (ref 0.44–1.00)
GFR calc Af Amer: >60 mL/min (ref >60)
GFR calc non Af Amer: >60 mL/min (ref >60)
Glucose, Bld: 86 mg/dL (ref 70–99)
Potassium: 3.3 mmol/L — ABNORMAL LOW (ref 3.5–5.1)
Sodium: 141 mmol/L (ref 135–145)
Total Bilirubin: 0.6 mg/dL (ref 0.3–1.2)
Total Protein: 6.6 g/dL (ref 6.5–8.1)

## 2019-05-30 LAB — CBC
HCT: 38 % (ref 36.0–46.0)
Hemoglobin: 11.8 g/dL — ABNORMAL LOW (ref 12.0–15.0)
MCH: 26.2 pg (ref 26.0–34.0)
MCHC: 31.1 g/dL (ref 30.0–36.0)
MCV: 84.4 fL (ref 80.0–100.0)
Platelets: 206 10*3/uL (ref 150–400)
RBC: 4.5 MIL/uL (ref 3.87–5.11)
RDW: 14.7 % (ref 11.5–15.5)
WBC: 9.7 10*3/uL (ref 4.0–10.5)
nRBC: 0 % (ref 0.0–0.2)

## 2019-05-30 LAB — GLUCOSE, CAPILLARY
Glucose-Capillary: 106 mg/dL — ABNORMAL HIGH (ref 70–99)
Glucose-Capillary: 87 mg/dL (ref 70–99)
Glucose-Capillary: 93 mg/dL (ref 70–99)

## 2019-05-30 MED ORDER — LORAZEPAM 1 MG PO TABS
2.0000 mg | ORAL_TABLET | ORAL | Status: DC | PRN
  Administered 2019-05-31 – 2019-06-02 (×3): 2 mg via ORAL
  Filled 2019-05-30 (×4): qty 2

## 2019-05-30 MED ORDER — LORAZEPAM 2 MG/ML IJ SOLN
INTRAMUSCULAR | Status: AC
  Filled 2019-05-30: qty 2

## 2019-05-30 MED ORDER — PERPHENAZINE 2 MG PO TABS: 6.0000 mg | ORAL_TABLET | Freq: Three times a day (TID) | ORAL | Status: DC

## 2019-05-30 MED ORDER — LORAZEPAM 2 MG/ML IJ SOLN
4.0000 mg | Freq: Once | INTRAMUSCULAR | Status: AC
  Administered 2019-05-30: 4 mg via INTRAMUSCULAR

## 2019-05-30 MED ORDER — LORAZEPAM 2 MG/ML IJ SOLN: 2.0000 mg | INTRAMUSCULAR | Status: DC | PRN

## 2019-05-30 MED ORDER — POTASSIUM CHLORIDE CRYS ER 20 MEQ PO TBCR
20.0000 meq | EXTENDED_RELEASE_TABLET | Freq: Two times a day (BID) | ORAL | Status: AC
  Administered 2019-05-30: 20 meq via ORAL
  Filled 2019-05-30: qty 1

## 2019-05-30 MED ORDER — PERPHENAZINE 8 MG PO TABS
8.0000 mg | ORAL_TABLET | Freq: Three times a day (TID) | ORAL | Status: DC
  Administered 2019-05-30 – 2019-06-01 (×5): 8 mg via ORAL
  Filled 2019-05-30 (×11): qty 1

## 2019-05-30 NOTE — Progress Notes (Signed)
   05/30/19 2000  Psych Admission Type (Psych Patients Only)  Admission Status Voluntary  Psychosocial Assessment  Patient Complaints Anxiety;Agitation  Eye Contact Brief  Facial Expression Anxious  Affect Euphoric  Speech Loud  Interaction Assertive  Motor Activity Fidgety  Appearance/Hygiene Unremarkable  Behavior Characteristics Cooperative  Mood Anxious;Labile  Aggressive Behavior  Effect No apparent injury  Thought Process  Coherency WDL  Content WDL  Delusions None reported or observed  Perception WDL  Hallucination None reported or observed  Judgment WDL  Confusion None  Danger to Self  Current suicidal ideation? Denies  Danger to Others  Danger to Others None reported or observed   Pt stated she was ok.

## 2019-05-30 NOTE — [facility] Suicide Risk Assessment (Signed)
[facility] Admission Suicide Risk Assessment   Nursing information obtained from:  Patient, Review of record Demographic factors:  Unemployed Current Mental Status:  NA Loss Factors:  NA Historical Factors:  Impulsivity Risk Reduction Factors:  Positive social support, Positive therapeutic relationship  Total Time spent with patient: 45 minutes Principal Problem: Schizoaffective disorder, bipolar type ([place]) Diagnosis:  Principal Problem:   Schizoaffective disorder, bipolar type ([place]) Active Problems:   Suicidal ideation  Subjective Data: Manic in need of stabilization  Continued Clinical Symptoms:  Alcohol Use Disorder Identification Test Final Score (AUDIT): 3 The "Alcohol Use Disorders Identification Test", Guidelines for Use in Primary Care, Second Edition.  World Pharmacologist [facility]). Score between 0-7:  no or low risk or alcohol related problems. Score between 8-15:  moderate risk of alcohol related problems. Score between 16-19:  high risk of alcohol related problems. Score 20 or above:  warrants further diagnostic evaluation for alcohol dependence and treatment.   CLINICAL FACTORS:   Bipolar Disorder:   Mixed State Musculoskeletal: Strength & Muscle Tone: within normal limits Gait & Station: normal Patient leans: N/A  Psychiatric Specialty Exam: Physical Exam  Nursing note and vitals reviewed. Constitutional: She appears well-developed and well-nourished.  Cardiovascular: Normal rate and regular rhythm.    Review of Systems  Constitutional: Negative.   Eyes: Negative.   Respiratory: Negative.   Cardiovascular: Negative.   Endocrine: Negative.   Genitourinary: Negative.   Allergic/Immunologic: Negative.   Neurological: Negative.   Obesity  Blood pressure 114/75, pulse 73, temperature 98.9 F (37.2 C), resp. rate 20, SpO2 100 %.There is no height or weight on file to calculate BMI.  General Appearance: Disheveled  Eye Contact:  Minimal  Speech:  Pressured   Volume:  Increased  Mood:  Manic and expansive  Affect:  Congruent and Labile  Thought Process:  Disorganized and Descriptions of Associations: Loose  Orientation:  Full (Time, Place, and Person) would not answer but presumed person place situation  Thought Content:  Delusions and Hallucinations: Auditory  Suicidal Thoughts:  No  Homicidal Thoughts:  No  Memory:  Immediate;   Poor Recent;   Poor  Judgement:  Impaired  Insight:  Shallow  Psychomotor Activity:  Increased  Concentration:  Concentration: Poor and Attention Span: Poor  Recall:  Poor  Fund of Knowledge:  Poor  Language:  Pressured rambling manic  Akathisia:  Negative  Handed:  Right  AIMS (if indicated):     Assets:  Desire for Improvement Housing Physical Health Resilience Social Support  ADL's:  Intact  Cognition:  WNL  Sleep:        COGNITIVE FEATURES THAT CONTRIBUTE TO RISK:  Loss of executive function    SUICIDE RISK:   Minimal: No identifiable suicidal ideation.  Patients presenting with no risk factors but with morbid ruminations; may be classified as minimal risk based on the severity of the depressive symptoms  PLAN OF CARE: Admit for stabilization  I certify that inpatient services furnished can reasonably be expected to improve the patient's condition.   Johnn Hai, MD 05/30/2019, 1:19 PM

## 2019-05-30 NOTE — H&P (Signed)
Psychiatric Admission Assessment Adult  Patient Identification: [name] MRN:  [id number] Date of Evaluation:  05/30/2019 Chief Complaint:  Schizoaffective disorder, bipolar type ([place]) [F25.0] Principal Diagnosis: Schizoaffective disorder, bipolar type ([place]) Diagnosis:  Principal Problem:   Schizoaffective disorder, bipolar type ([place]) Active Problems:   Suicidal ideation  History of Present Illness:   This is the latest of multiple admissions and healthcare system encounters for [name], she clearly requires inpatient stabilization.  She presented on 2/1 in a manic state of mind endorsing numerous symptoms to include paranoia, believing people are going to find her, as suffering from auditory hallucinations and seeing people "laugh at her" reporting suicidal thoughts, and she had phoned her act team who recommended she come for an inpatient evaluation.  She had been moved from a motel to transitional housing but she has suffered this exacerbation prior to being placed in independent living.  Immediately upon arriving on the ward she begins singing gospel music and dancing, singing and talking very loudly, is extremely disruptive to the milieu, and agitating to the other patients.  She had required IM medications almost immediately so as not to escalate the acuity of the milieu.  She had been stabilized in the past with long-acting injectable aripiprazole, her current med list includes perphenazine at 12 mg a day.  She is unable to name her current medications however.  She is too disorganized.  She is currently alert oriented to person place situation again almost impossible to redirect due to her level of agitation/mania/singing behaviors. She had reported auditory and visual hallucinations recently she does not answer the question currently.  She does deny wanting to harm self or wanting to harm others during my interview.  Despite her disorganization and mania she understands what it  means to contract for safety.  Drug screen negative for all compounds tested gave me contradictory information however on the amount of alcohol she is ingesting -seems she does abuse alcohol in a binge type fashion/alcohol level negligible  According to assessment team notes  History of Present Illness:  TTS Assessment:  [name] an 29 y.o.female, who presents voluntary and unaccompanied to [facility].Clinician asked the pt, "what brought you to the hospital?"Pt reported, hearing people talking about her, calling her "crazy, old ass, a bald headed bitch." Pt reported, seeing and heating people laugh at her. Pt reported, feeling paranoid, someone is [place] fight her [place] bus [place]ot. Pt reported, she has OCD, that is why she shaves he head. Pt reported, hearing the voices and seeing people laugh at her made her feel suicidal. Pt reported, five previous suicide attempts. Pt reported, she was going to take medications for Schizophrenia but she called her ACT Team who recommended she come to [facility]. Pt reported, her ACT Team arranged she moved from a motel to [employer], with the goal of independent living (pt's own place) in two months. Pt denies, HI and self-injurious behaviors and access to weapons.   Ptreported, smoking five packs of cigarettes, daily. Pt reported, drinking once Four Loco and three Mickey's (beers),yesterday.[facility], for medication management. Per chart, pt has previous inpatient admissions.  Pt presents alert with loud speech. Pt's eye contact was good. Pt's mood was pleasant, anxious. Pt's affect was congruent with mood. Pt's thought process was coherent, relevant. Pt's judgement was partial. Pt was oriented x4. Pt's concentration normal. Pt's insight and impulse control was fair. Pt reported, if discharged from [facility] she  could not contract for safety, (she would hurt herself.)   *Pt declined clinician  to contact anyone (family, friend supports) to obtain collateral information.*  Evaluation on Unit: Reviewed TTS assessment and validated with patient. Reports that she takes lithium and perphenazine as prescribed. Reports that she takes levemir 40 units BID, she has not taken insulin today.  On evaluation patient is alert and oriented x 4, pleasant, and cooperative. Speech is clear and coherent. Mood is depressed and affect is congruent with mood. Thought process is coherent. She reports feeling paranoid. States that she has auditory hallucinations. States that one of the voices told her to shave her head. No indication that patient is responding to internal stimuli. Denies homicidal ideations. Denies substance abuse.  Associated Signs/Symptoms: Depression Symptoms:  psychomotor agitation, (Hypo) Manic Symptoms:  Delusions, Elevated Mood, Flight of Ideas, Hallucinations, Anxiety Symptoms:  Excessive Worry, Psychotic Symptoms:  Delusions, Hallucinations: Auditory PTSD Symptoms: NA Total Time spent with patient: 45 minutes  Past Psychiatric History: Multiple admissions numerous medication trials to include long-acting injectable aripiprazole, buspirone, Cymbalta, Lexapro, fluoxetine, fluvoxamine, lamotrigine, haloperidol, lithium carbonate, Invega Sustenna, Trileptal, oral Invega, Ambien, trazodone, sertraline-  Is the patient at risk to self? Yes.    Has the patient been a risk to self in the past 6 months? No.  Has the patient been a risk to self within the distant past? Yes.    Is the patient a risk to others? No.  Has the patient been a risk to others in the past 6 months? No.  Has the patient been a risk to others within the distant past? No.   Prior Inpatient Therapy: Prior Inpatient Therapy: Yes Prior Therapy Dates: Per chart, "2020, 2019 and mult other admissions."  Prior Therapy Facilty/Provider(s): Per chart, "[facility], [place]."  Reason for Treatment: Per chart, "Bipolar."   Prior Outpatient Therapy: Prior Outpatient Therapy: Yes Prior Therapy Dates: Current. Prior Therapy Facilty/Provider(s[facility]. Reason for Treatment: Medication management.  Does patient have an ACCT team?: Yes Does patient have Intensive In-House Services?  : No Does patient have [facility] services? : No Does patient have P4CC services?: No  Alcohol Screening: 1. How often do you have a drink containing alcohol?: 2 to 3 times a week 2. How many drinks containing alcohol do you have on a typical day when you are drinking?: 1 or 2 3. How often do you have six or more drinks on one occasion?: Never AUDIT-C Score: 3 4. How often during the last year have you found that you were not able to stop drinking once you had started?: Never 5. How often during the last year have you failed to do what was normally expected from you becasue of drinking?: Never 6. How often during the last year have you needed a first drink in the morning to get yourself going after a heavy drinking session?: Never 7. How often during the last year have you had a feeling of guilt of remorse after drinking?: Never 8. How often during the last year have you been unable to remember what happened the night before because you had been drinking?: Never 9. Have you or someone else been injured as a result of your drinking?: No 10. Has a relative or friend or a doctor or another health worker been concerned about your drinking or suggested you cut down?: No Alcohol Use Disorder Identification Test Final Score (AUDIT): 3 Alcohol Brief Interventions/Follow-up: AUDIT Score <7 follow-up not indicated Substance Abuse History  in the last 12 months:  No. Consequences of Substance Abuse: NA Previous Psychotropic Medications: Yes  Psychological Evaluations: No  Past Medical History:  Past Medical History:  Diagnosis Date  . Anxiety   . Bipolar 1 disorder ([place])   . Cognitive deficits   . Depression   .  Diabetes mellitus without complication ([place])   . Hypertension   . Mental disorder   . Mental health disorder   . Obesity     Past Surgical History:  Procedure Laterality Date  . CESAREAN SECTION    . CESAREAN SECTION N/A 04/25/2013   Procedure: REPEAT CESAREAN SECTION;  Surgeon: [name], MD;  Location: [facility];  Service: Obstetrics;  Laterality: N/A;  . MASS EXCISION N/A 06/03/2012   Procedure: EXCISION MASS;  Surgeon: [name], MD;  Location: [facility];  Service: ENT;  Laterality: N/A;  Excision uvula mass  . TONSILLECTOMY N/A 06/03/2012   Procedure: TONSILLECTOMY;  Surgeon: [name], MD;  Location: [facility];  Service: ENT;  Laterality: N/A;  . TONSILLECTOMY     Family History:  Family History  Problem Relation Age of Onset  . Hypertension Mother   . Diabetes Father    Family Psychiatric  History: Patient denied but is not a reliable historian Tobacco Screening:   Social History:  Social History   Substance and Sexual Activity  Alcohol Use Never     Social History   Substance and Sexual Activity  Drug Use Never   Comment: Patient denies    Additional Social History: Marital status: Single    Pain Medications: See MAR Prescriptions: See MAR Over the Counter: See MAR History of alcohol / drug use?: Yes Name of Substance 1: Cigarettes. 1 - Age of First Use: UTA 1 - Amount (size/oz): Pt reported, smoking five packs of cigerettes, daily. 1 - Frequency: Daily. 1 - Duration: Ongoing. 1 - Last Use / Amount: Daily. Name of Substance 2: Alcohol. 2 - Age of First Use: UTA 2 - Amount (size/oz): Pt reported, drinking once Four Loco and three Mickey's (beers), yesteday. 2 - Frequency: Ongoing. 2 - Duration: Ongoing. 2 - Last Use / Amount: Yesterday (05/28/2019).                Allergies:   Allergies  Allergen Reactions  . Wellbutrin [Bupropion] Shortness Of Breath  . Omnipaque [Iohexol] Swelling and Other (See  Comments)    Reaction:  Eye swelling  . Penicillins Hives and Other (See Comments)    Has patient had a PCN reaction causing immediate rash, facial/tongue/throat swelling, SOB or lightheadedness with hypotension: Unknown Has patient had a PCN reaction causing severe rash involving mucus membranes or skin necrosis: Yes Has patient had a PCN reaction that required hospitalization Unknown Has patient had a PCN reaction occurring within the last 10 years: No If all of the above answers are "NO", then may proceed with Cephalosporin use.  Herma Mering [Benztropine]     Make pt feel crazy  . Depakote Er [Divalproex Sodium Er] Nausea And Vomiting  . Divalproex Sodium   . Iohexol    Lab Results:  Results for orders placed or performed during the hospital encounter of 05/29/19 (from the past 48 hour(s))  Respiratory Panel by RT PCR (Flu A&B, Covid) - Nasopharyngeal Swab     Status: None   Collection Time: 05/29/19  8:46 PM   Specimen: Nasopharyngeal Swab  Result Value Ref Range   SARS Coronavirus 2 by RT PCR NEGATIVE  NEGATIVE    Comment: (NOTE) SARS-CoV-2 target nucleic acids are NOT DETECTED. The SARS-CoV-2 RNA is generally detectable in upper respiratoy specimens during the acute phase of infection. The lowest concentration of SARS-CoV-2 viral copies this assay can detect is 131 copies/mL. A negative result does not preclude SARS-Cov-2 infection and should not be used as the sole basis for treatment or other patient management decisions. A negative result may occur with  improper specimen collection/handling, submission of specimen other than nasopharyngeal swab, presence of viral mutation(s) within the areas targeted by this assay, and inadequate number of viral copies (<131 copies/mL). A negative result must be combined with clinical observations, patient history, and epidemiological information. The expected result is Negative. Fact Sheet for Patients:   PinkCheek.be Fact Sheet for Healthcare Providers:  GravelBags.it This test is not yet ap proved or cleared by the [place] FDA and  has been authorized for detection and/or diagnosis of SARS-CoV-2 by FDA under an Emergency Use Authorization (EUA). This EUA will remain  in effect (meaning this test can be used) for the duration of the COVID-19 declaration under Section 564(b)(1) of the Act, 21 U.S.C. section 360bbb-3(b)(1), unless the authorization is terminated or revoked sooner.    Influenza A by PCR NEGATIVE NEGATIVE   Influenza B by PCR NEGATIVE NEGATIVE    Comment: (NOTE) The Xpert Xpress SARS-CoV-2/FLU/RSV assay is intended as an aid in  the diagnosis of influenza from Nasopharyngeal swab specimens and  should not be used as a sole basis for treatment. Nasal washings and  aspirates are unacceptable for Xpert Xpress SARS-CoV-2/FLU/RSV  testing. Fact Sheet for Patients: PinkCheek.be Fact Sheet for Healthcare Providers: GravelBags.it This test is not yet approved or cleared by the [place] FDA and  has been authorized for detection and/or diagnosis of SARS-CoV-2 by  FDA under an Emergency Use Authorization (EUA). This EUA will remain  in effect (meaning this test can be used) for the duration of the  Covid-19 declaration under Section 564(b)(1) of the Act, 21  U.S.C. section 360bbb-3(b)(1), unless the authorization is  terminated or revoked. Performed at [facility], [place] [address] [place], [place] [address]   Glucose, capillary     Status: Abnormal   Collection Time: 05/29/19 10:41 PM  Result Value Ref Range   Glucose-Capillary 169 (H) 70 - 99 mg/dL   Comment 1 Notify RN    Comment 2 Document in Chart   CBC     Status: Abnormal   Collection Time: 05/30/19  6:28 AM  Result Value Ref Range   WBC 9.7 4.0 - 10.5 K/uL   RBC 4.50  3.87 - 5.11 MIL/uL   Hemoglobin 11.8 (L) 12.0 - 15.0 g/dL   HCT 38.0 36.0 - 46.0 %   MCV 84.4 80.0 - 100.0 fL   MCH 26.2 26.0 - 34.0 pg   MCHC 31.1 30.0 - 36.0 g/dL   RDW 14.7 11.5 - 15.5 %   Platelets 206 150 - 400 K/uL    Comment: REPEATED TO VERIFY PLATELET COUNT CONFIRMED BY SMEAR SPECIMEN CHECKED FOR CLOTS    nRBC 0.0 0.0 - 0.2 %    Comment: Performed at [facility], [place] [address], [place], [place] [address]  Comprehensive metabolic panel     Status: Abnormal   Collection Time: 05/30/19  6:28 AM  Result Value Ref Range   Sodium 141 135 - 145 mmol/L   Potassium 3.3 (L) 3.5 - 5.1 mmol/L   Chloride 109 98 -  111 mmol/L   CO2 26 22 - 32 mmol/L   Glucose, Bld 86 70 - 99 mg/dL   BUN 7 6 - 20 mg/dL   Creatinine, Ser 0.74 0.44 - 1.00 mg/dL   Calcium 8.8 (L) 8.9 - 10.3 mg/dL   Total Protein 6.6 6.5 - 8.1 g/dL   Albumin 3.6 3.5 - 5.0 g/dL   AST 20 15 - 41 U/L   ALT 20 0 - 44 U/L   Alkaline Phosphatase 28 (L) 38 - 126 U/L   Total Bilirubin 0.6 0.3 - 1.2 mg/dL   GFR calc non Af Amer >60 >60 mL/min   GFR calc Af Amer >60 >60 mL/min   Anion gap 6 5 - 15    Comment: Performed at [facility], [place] [address] [place], [place] [address]  Glucose, capillary     Status: Abnormal   Collection Time: 05/30/19  8:00 AM  Result Value Ref Range   Glucose-Capillary 106 (H) 70 - 99 mg/dL  Glucose, capillary     Status: None   Collection Time: 05/30/19 11:29 AM  Result Value Ref Range   Glucose-Capillary 87 70 - 99 mg/dL    Blood Alcohol level:  Lab Results  Component Value Date   ETH <10 05/19/2019   ETH <10 99991111    Metabolic Disorder Labs:  Lab Results  Component Value Date   HGBA1C 5.2 05/19/2019   MPG 102.54 05/19/2019   MPG 108.28 10/11/2018   Lab Results  Component Value Date   PROLACTIN 5.3 05/19/2019   PROLACTIN 5.2 10/11/2018   Lab Results  Component Value Date   CHOL 172 05/19/2019   TRIG 75 05/19/2019   HDL 45  05/19/2019   CHOLHDL 3.8 05/19/2019   VLDL 15 05/19/2019   LDLCALC 112 (H) 05/19/2019   LDLCALC NOT CALCULATED 10/11/2018    Current Medications: Current Facility-Administered Medications  Medication Dose Route Frequency Provider Last Rate Last Admin  . acetaminophen (TYLENOL) tablet 650 mg  650 mg Oral Q6H PRN Lindon Romp A, NP      . alum & mag hydroxide-simeth (MAALOX/MYLANTA) 200-200-20 MG/5ML suspension 30 mL  30 mL Oral Q4H PRN Lindon Romp A, NP      . hydrOXYzine (ATARAX/VISTARIL) tablet 25 mg  25 mg Oral TID PRN Lindon Romp A, NP      . insulin detemir (LEVEMIR) injection 40 Units  40 Units Subcutaneous BID Lindon Romp A, NP   40 Units at 05/30/19 0844  . lithium carbonate (ESKALITH) CR tablet 900 mg  900 mg Oral QHS Lindon Romp A, NP   900 mg at 05/29/19 2312  . LORazepam (ATIVAN) 2 MG/ML injection           . LORazepam (ATIVAN) tablet 2 mg  2 mg Oral Q4H PRN Johnn Hai, MD       Or  . LORazepam (ATIVAN) injection 2 mg  2 mg Intramuscular Q4H PRN Johnn Hai, MD      . magnesium hydroxide (MILK OF MAGNESIA) suspension 30 mL  30 mL Oral Daily PRN Lindon Romp A, NP      . perphenazine (TRILAFON) tablet 8 mg  8 mg Oral TID Johnn Hai, MD      . potassium chloride SA (KLOR-CON) CR tablet 20 mEq  20 mEq Oral BID Cobos, Myer Peer, MD      . propranolol (INDERAL) tablet 10 mg  10 mg Oral BID Lindon Romp A, NP   10 mg at 05/30/19 [id number]  PTA Medications: Medications Prior to Admission  Medication Sig Dispense Refill Last Dose  . diphenhydrAMINE (SOMINEX) 25 MG tablet Take 25 mg by mouth at bedtime as needed for sleep.     Marland Kitchen Fluvoxamine Maleate 150 MG CP24 Take 150 mg by mouth daily.     . insulin detemir (LEVEMIR) 100 UNIT/ML injection Inject 40 Units into the skin 2 (two) times daily.      Marland Kitchen lithium carbonate (ESKALITH) 450 MG CR tablet Take 900 mg by mouth at bedtime.      Marland Kitchen perphenazine (TRILAFON) 2 MG tablet Take 3 tablets (6 mg total) by mouth 2 (two) times daily. 30  tablet 0   . propranolol (INDERAL) 10 MG tablet Take 10 mg by mouth 2 (two) times a day.       Musculoskeletal: Strength & Muscle Tone: within normal limits Gait & Station: normal Patient leans: N/A  Psychiatric Specialty Exam: Physical Exam  Nursing note and vitals reviewed. Constitutional: She appears well-developed and well-nourished.  Cardiovascular: Normal rate and regular rhythm.    Review of Systems  Constitutional: Negative.   Eyes: Negative.   Respiratory: Negative.   Cardiovascular: Negative.   Endocrine: Negative.   Genitourinary: Negative.   Allergic/Immunologic: Negative.   Neurological: Negative.   Obesity  Blood pressure 114/75, pulse 73, temperature 98.9 F (37.2 C), resp. rate 20, SpO2 100 %.There is no height or weight on file to calculate BMI.  General Appearance: Disheveled  Eye Contact:  Minimal  Speech:  Pressured  Volume:  Increased  Mood:  Manic and expansive  Affect:  Congruent and Labile  Thought Process:  Disorganized and Descriptions of Associations: Loose  Orientation:  Full (Time, Place, and Person) would not answer but presumed person place situation  Thought Content:  Delusions and Hallucinations: Auditory  Suicidal Thoughts:  No  Homicidal Thoughts:  No  Memory:  Immediate;   Poor Recent;   Poor  Judgement:  Impaired  Insight:  Shallow  Psychomotor Activity:  Increased  Concentration:  Concentration: Poor and Attention Span: Poor  Recall:  Poor  Fund of Knowledge:  Poor  Language:  Pressured rambling manic  Akathisia:  Negative  Handed:  Right  AIMS (if indicated):     Assets:  Desire for Improvement Housing Physical Health Resilience Social Support  ADL's:  Intact  Cognition:  WNL  Sleep:       Treatment Plan Summary: Daily contact with patient to assess and evaluate symptoms and progress in treatment and Medication management  Observation Level/Precautions:  15 minute checks  Laboratory:  UDS  Psychotherapy: Reality  based  Medications: Oral and in need of long-acting  Consultations: Not necessary  Discharge Concerns: Longer-term stability  Estimated LOS: 7-10  Other: Address diabetes and hypertension in addition to psychosis   Physician Treatment Plan for Primary Diagnosis: Schizoaffective disorder, bipolar type (HCC)-resume and escalate perphenazine check with act team regarding long-acting injectable Long Term Goal(s): Improvement in symptoms so as ready for discharge  Short Term Goals: Ability to disclose and discuss suicidal ideas and Ability to demonstrate self-control will improve  Physician Treatment Plan for Secondary Diagnosis: Principal Problem:   Schizoaffective disorder, bipolar type ([place]) Active Problems:   Suicidal ideation  Long Term Goal(s): Improvement in symptoms so as ready for discharge  Short Term Goals: Ability to identify and develop effective coping behaviors will improve, Ability to maintain clinical measurements within normal limits will improve and Ability to identify triggers associated with substance abuse/mental health issues will improve  I certify that inpatient services furnished can reasonably be expected to improve the patient's condition.    Johnn Hai, MD 2/2/20211:07 PM

## 2019-05-30 NOTE — Discharge Summary (Signed)
  Patient to be transferred to [facility] for inpatient psychiatric treatment 

## 2019-05-30 NOTE — Progress Notes (Signed)
Patient ID: [name], female   DOB: Nov 24, 1990, 29 y.o.   MRN: [id number] Pt A&O x 4, presents with paranoia, hearing people talking about her, calling her names.  Pt reports seeing & hearing people laugh at her.  Pt denies SI, HI or self injurious behaviors  Pt very loud and boisterous but redirectable and cooperative.  Skin search completed, monitoring for safety.

## 2019-05-30 NOTE — Progress Notes (Signed)
[place] NOVEL CORONAVIRUS (COVID-19) DAILY CHECK-OFF SYMPTOMS - answer yes or no to each - every day NO YES  Have you had a fever in the past 24 hours?  . Fever (Temp > 37.80C / 100F) X   Have you had any of these symptoms in the past 24 hours? . New Cough .  Sore Throat  .  Shortness of Breath .  Difficulty Breathing .  Unexplained Body Aches   X   Have you had any one of these symptoms in the past 24 hours not related to allergies?   . Runny Nose .  Nasal Congestion .  Sneezing   X   If you have had runny nose, nasal congestion, sneezing in the past 24 hours, has it worsened?  X   EXPOSURES - check yes or no X   Have you traveled outside the state in the past 14 days?  X   Have you been in contact with someone with a confirmed diagnosis of COVID-19 or PUI in the past 14 days without wearing appropriate PPE?  X   Have you been living in the same home as a person with confirmed diagnosis of COVID-19 or a PUI (household contact)?    X   Have you been diagnosed with COVID-19?    X              What to do next: Answered NO to all: Answered YES to anything:   Proceed with unit schedule Follow the [facility] Inpatient Flowsheet.   [name] [name] MSN, RN, [facility] [phone] 

## 2019-05-30 NOTE — BH Assessment (Addendum)
[place] Assessment Progress Note  Per Neita Garnet, MD, this volunary pt requires psychiatric hospitalization at this time.  Jasmine has assigned pt to [facility] Rm 306-1; unit will be ready to receive pt at 11:00.  Pt's nurse, Felipa Eth, has been notified.  Jalene Mullet, [place] Coordinator [phone]

## 2019-05-30 NOTE — Progress Notes (Signed)
Patient ID: [name], female   DOB: 10-Mar-1991, 29 y.o.   MRN: [id number] 05/30/2019 at 8:30 AM Observation unit follow-up note  29 year old female, presented to hospital voluntarily, reporting increased anxiety, auditory hallucinations, suicidal ideations with thoughts of overdosing. States "I can feel I have been depressed lately".  States she has been experiencing auditory hallucinations "telling me I am a bald headed b.....". States these have been getting worse and more distracting . She endorses recent suicidal thoughts of overdosing.  She also describes "OCD, it got really bad why I shaved my head", but does not elaborate further on this. Patient is known to our services from previous treatment-she has been diagnosed with schizoaffective disorder in the past.  She is followed by act team.  She is currently living in a motel, with a plan of moving into transitional housing in a few weeks. Patient reports she has been taking her medications regularly.  She denies drug or alcohol abuse.  Most recent UDS/BAL in chart are from 1/22 and were negative.  Home medications-Luvox 150 mg daily, Trilafon 6 mg twice daily, propranolol 10 mg twice daily, lithium 900 mg nightly. She is also diabetic for which she is prescribed insulin detemir.  Mental status exam-at this time presents alert, attentive, oriented x3, cooperative on approach.  Speech variable sometimes loud.  No psychomotor agitation at this time.  Describes her mood as depressed.  Affect is anxious, thought process circumstantial.  Endorses recent SI with thoughts of overdosing, denies HI, endorses auditory hallucinations which she states have worsened in severity recently.  No delusions are currently expressed.   Labs reviewed-2/2 CBG 106. Potassium 3.3, alk phos 28, otherwise unremarkable BMP and CBC  Dx- Schizoaffective Disorder   Plan- Based on above assessment inpatient psychiatric admission recommended.   She is currently  continued on her home medications . KDUR 20 mEq x 2 doses  Li serum level / EKG ordered   F Ica Daye MD

## 2019-05-30 NOTE — Progress Notes (Signed)
  Pt is black female of 28 years, presents voluntary with AVH denied SI and HI.   Patient  reports fair appetite, energy normal, and fair sleeping pattern with mediaction. Pt endorses  depression, hopelessness, and anxiety. Pt reports AVH only happens in crowds, denies SI and HI today. Pt reports last BM yesterday,no pain reported.    Vitals signs WNL and being monitored.  Medication given as Rx, no adverse reaction observed, safety maintained with q15 minute checks.    Lolly Mustache. Bobby Rumpf MSN, [place], [facility] [phone]

## 2019-05-30 NOTE — Plan of Care (Signed)
[facility] Observation Crisis Plan  Reason for Crisis Plan:  Crisis Stabilization   Plan of Care:  Referral for Inpatient Hospitalization  Family Support:      Current Living Environment:  Living Arrangements: Other (Comment)(Transitional Housing. )  Insurance:   Hospital Account    Name Acct ID Class Status Primary Coverage   [name], [name] [id number] [place]        Guarantor Account (for Hospital Account [id number])    Name Relation to Pt Service Area Active? Acct Type   Claudie [name] Self [facility] Yes Behavioral Health   Address Phone       [address] [place], [place] [address] [phone])          Coverage Information (for Hospital Account [id number])    F/O Payor/Plan Precert #   [facility] MEDICAID/[place] MEDICA[place]    Subscriber Subscriber #   [name] [id number]   Address Phone   [address] [place], [place] [address] [phone]      Legal Guardian:  Legal Guardian: Other:(Self. )  Primary Care Provider:  Benito Mccreedy, MD  Current Outpatient Providers:  ACT  Psychiatrist:  Name of Psychiatrist: [facility].  Counselor/Therapist:  Name of Therapist: [facility].  Compliant with Medications:  Yes  Additional Information:   [name] 2/2/202112:07 AM

## 2019-05-30 NOTE — Progress Notes (Signed)
Pt accepted to [facility]; 306-01.     Mordecai Maes, NP is the accepting provider.    Dr. Parke Poisson is the attending provider.    Call report to [phone].        Pt is Voluntary.    Patient may admit to the unit after 11:00am.   Domenic Schwab, MSW, [place] Disposition Social Worker [facility]/TTS [phone]

## 2019-05-31 LAB — GLUCOSE, CAPILLARY
Glucose-Capillary: 121 mg/dL — ABNORMAL HIGH (ref 70–99)
Glucose-Capillary: 95 mg/dL (ref 70–99)
Glucose-Capillary: 87 mg/dL (ref 70–99)
Glucose-Capillary: 99 mg/dL (ref 70–99)

## 2019-05-31 MED ORDER — ARIPIPRAZOLE ER 400 MG IM SRER
400.0000 mg | INTRAMUSCULAR | Status: DC
  Administered 2019-05-31: 400 mg via INTRAMUSCULAR

## 2019-05-31 MED ORDER — CLONAZEPAM 1 MG PO TABS
2.0000 mg | ORAL_TABLET | Freq: Two times a day (BID) | ORAL | Status: AC
  Administered 2019-05-31 – 2019-06-01 (×4): 2 mg via ORAL
  Filled 2019-05-31 (×4): qty 2

## 2019-05-31 NOTE — Progress Notes (Signed)
Recreation Therapy Notes  INPATIENT RECREATION THERAPY ASSESSMENT  Patient Details Name: [name] MRN: [id number] DOB: [birth year] Today's Date: 05/31/2019       Information Obtained From: Patient  Able to Participate in Assessment/Interview: Yes  Patient Presentation: (Drowsy)  Reason for Admission (Per Patient): Other (Comments)(Drowsy)  Patient Stressors: Family, Other (Comment)(Mom; Living situation)  Coping Skills:   Isolation, Self-Injury, Music, Meditate, Talk, Art, Prayer  Leisure Interests (2+):  Individual - Other (Comment)(Sleep; Go out)  Frequency of Recreation/Participation: Other (Comment)(Daily)  Awareness of Community Resources:  Yes  Community Resources:  Restaurants, Other (Comment)(Stores)  Current Use: Yes  If no, Barriers?:    Expressed Interest in [place]: No  County of Residence:  [place]  Patient Main Form of Transportation: Walk  Patient Strengths:  Like to laugh; Have fun  Patient Identified Areas of Improvement:  Loudness; Staying away from people  Patient Goal for Hospitalization:  "get better"  Current SI (including self-harm):  No  Current HI:  No  Current AVH: Yes(Hear voices laughing at her bald head)  Staff Intervention Plan: Group Attendance, Collaborate with Interdisciplinary Treatment Team  Consent to Intern Participation: N/A    [name], LRT/CTRS  [name] A 05/31/2019, 1:36 PM

## 2019-05-31 NOTE — Plan of Care (Signed)
With member of the [facility]SI act team they requested we readminister her long-acting injectable aripiprazole which is due. Left to my discretion as far as further med adjustments

## 2019-05-31 NOTE — Progress Notes (Signed)
Recreation Therapy Notes  Date: 2.3.21 Time: 1000 Location: 500 Hall Dayroom  Group Topic: Leisure Education  Goal Area(s) Addresses:  Patient will identify positive leisure activities.  Patient will identify one positive benefit of participation in leisure activities.   Intervention: Leisure Group Game  Activity: LRT and patients played a Recruitment consultant.  Each person will get a slip of paper with an activity on it out of the container and draw it on the board.  The rest of the group will attempt the guess the activity being drawn.  The person with guesses correctly gets the next turn.   Education:  Leisure Education, Dentist  Education Outcome: Acknowledges education/In group clarification offered/Needs additional education  Clinical Observations/Feedback: Pt did not attend group session.     [name], LRT/CTRS          Ria Comment, Ita Fritzsche A 05/31/2019 11:30 AM

## 2019-05-31 NOTE — Progress Notes (Signed)
[facility] MD Progress Note  05/31/2019 7:57 AM [name]  MRN:  [id number] Subjective:   This is the latest of multiple admissions and healthcare system encounters for [name], she presented in a very manic state requiring immediate IM medications when she reached the ward due to her singing/dancing/disruption to the milieu.  She had previously presented with intense paranoia and hallucinations but by the time she reached the ward her manic-propelled behaviors had displaced the previously expressed psychotic material.  Sleep is charted at 6 hours she remains pressured rambling and intrusive.  She denies wanting to harm herself however and she endorses no paranoia.  She states she wants "Haldol in the shot" but not the pills because it "makes her shake"  She has no current EPS or TD.  She believes the perphenazine was less effective at controlling her mania and so many words however she was on a very low dose.  Have placed a call to her [facility] act team to coordinate care  Principal Problem: Schizoaffective disorder, bipolar type ([place]) Diagnosis: Principal Problem:   Schizoaffective disorder, bipolar type ([place]) Active Problems:   Suicidal ideation  Total Time spent with patient: 30 minutes  Past Psychiatric History: Multiple admissions multiple experiences with long-acting injectable by her report  Past Medical History:  Past Medical History:  Diagnosis Date  . Anxiety   . Bipolar 1 disorder ([place])   . Cognitive deficits   . Depression   . Diabetes mellitus without complication ([place])   . Hypertension   . Mental disorder   . Mental health disorder   . Obesity     Past Surgical History:  Procedure Laterality Date  . CESAREAN SECTION    . CESAREAN SECTION N/A 04/25/2013   Procedure: REPEAT CESAREAN SECTION;  Surgeon: [name], MD;  Location: [facility];  Service: Obstetrics;  Laterality: N/A;  . MASS EXCISION N/A 06/03/2012   Procedure: EXCISION MASS;  Surgeon: [name], MD;   Location: [facility];  Service: ENT;  Laterality: N/A;  Excision uvula mass  . TONSILLECTOMY N/A 06/03/2012   Procedure: TONSILLECTOMY;  Surgeon: [name], MD;  Location: [facility];  Service: ENT;  Laterality: N/A;  . TONSILLECTOMY     Family History:  Family History  Problem Relation Age of Onset  . Hypertension Mother   . Diabetes Father    Family Psychiatric  History: No new data shared Social History:  Social History   Substance and Sexual Activity  Alcohol Use Never     Social History   Substance and Sexual Activity  Drug Use Never   Comment: Patient denies    Social History   Socioeconomic History  . Marital status: Single    Spouse name: Not on file  . Number of children: Not on file  . Years of education: Not on file  . Highest education level: Not on file  Occupational History  . Not on file  Tobacco Use  . Smoking status: Current Every Day Smoker    Packs/day: 2.00    Years: 11.00    Pack years: 22.00    Types: Cigarettes  . Smokeless tobacco: Never Used  Substance and Sexual Activity  . Alcohol use: Never  . Drug use: Never    Comment: Patient denies  . Sexual activity: Yes    Birth control/protection: Implant  Other Topics Concern  . Not on file  Social History Narrative   ** Merged History Encounter **  Social Determinants of Health   Financial Resource Strain:   . Difficulty of Paying Living Expenses: Not on file  Food Insecurity:   . Worried About Charity fundraiser in the Last Year: Not on file  . Ran Out of Food in the Last Year: Not on file  Transportation Needs:   . Lack of Transportation (Medical): Not on file  . Lack of Transportation (Non-Medical): Not on file  Physical Activity:   . Days of Exercise per Week: Not on file  . Minutes of Exercise per Session: Not on file  Stress:   . Feeling of Stress : Not on file  Social Connections:   . Frequency of Communication with Friends and  Family: Not on file  . Frequency of Social Gatherings with Friends and Family: Not on file  . Attends Religious Services: Not on file  . Active Member of Clubs or Organizations: Not on file  . Attends Archivist Meetings: Not on file  . Marital Status: Not on file   Additional Social History:    Pain Medications: See MAR Prescriptions: See MAR Over the Counter: See MAR History of alcohol / drug use?: Yes Name of Substance 1: Cigarettes. 1 - Age of First Use: UTA 1 - Amount (size/oz): Pt reported, smoking five packs of cigerettes, daily. 1 - Frequency: Daily. 1 - Duration: Ongoing. 1 - Last Use / Amount: Daily. Name of Substance 2: Alcohol. 2 - Age of First Use: UTA 2 - Amount (size/oz): Pt reported, drinking once Four Loco and three Mickey's (beers), yesteday. 2 - Frequency: Ongoing. 2 - Duration: Ongoing. 2 - Last Use / Amount: Yesterday (05/28/2019).                Sleep: Good  Appetite:  Good  Current Medications: Current Facility-Administered Medications  Medication Dose Route Frequency Provider Last Rate Last Admin  . acetaminophen (TYLENOL) tablet 650 mg  650 mg Oral Q6H PRN Lindon Romp A, NP      . alum & mag hydroxide-simeth (MAALOX/MYLANTA) 200-200-20 MG/5ML suspension 30 mL  30 mL Oral Q4H PRN Lindon Romp A, NP      . hydrOXYzine (ATARAX/VISTARIL) tablet 25 mg  25 mg Oral TID PRN Lindon Romp A, NP   25 mg at 05/31/19 0728  . insulin detemir (LEVEMIR) injection 40 Units  40 Units Subcutaneous BID Lindon Romp A, NP   40 Units at 05/31/19 0723  . lithium carbonate (ESKALITH) CR tablet 900 mg  900 mg Oral QHS Lindon Romp A, NP   900 mg at 05/30/19 2103  . LORazepam (ATIVAN) tablet 2 mg  2 mg Oral Q4H PRN Johnn Hai, MD   2 mg at 05/31/19 [id number]   Or  . LORazepam (ATIVAN) injection 2 mg  2 mg Intramuscular Q4H PRN Johnn Hai, MD      . magnesium hydroxide (MILK OF MAGNESIA) suspension 30 mL  30 mL Oral Daily PRN Lindon Romp A, NP      .  perphenazine (TRILAFON) tablet 8 mg  8 mg Oral TID Johnn Hai, MD   8 mg at 05/31/19 0723  . potassium chloride SA (KLOR-CON) CR tablet 20 mEq  20 mEq Oral BID Cobos, Myer Peer, MD   20 mEq at 05/30/19 2103  . propranolol (INDERAL) tablet 10 mg  10 mg Oral BID Lindon Romp A, NP   10 mg at 05/31/19 [id number]    Lab Results:  Results for orders placed or performed during the hospital encounter  of 05/29/19 (from the past 48 hour(s))  Respiratory Panel by RT PCR (Flu A&B, Covid) - Nasopharyngeal Swab     Status: None   Collection Time: 05/29/19  8:46 PM   Specimen: Nasopharyngeal Swab  Result Value Ref Range   SARS Coronavirus 2 by RT PCR NEGATIVE NEGATIVE    Comment: (NOTE) SARS-CoV-2 target nucleic acids are NOT DETECTED. The SARS-CoV-2 RNA is generally detectable in upper respiratoy specimens during the acute phase of infection. The lowest concentration of SARS-CoV-2 viral copies this assay can detect is 131 copies/mL. A negative result does not preclude SARS-Cov-2 infection and should not be used as the sole basis for treatment or other patient management decisions. A negative result may occur with  improper specimen collection/handling, submission of specimen other than nasopharyngeal swab, presence of viral mutation(s) within the areas targeted by this assay, and inadequate number of viral copies (<131 copies/mL). A negative result must be combined with clinical observations, patient history, and epidemiological information. The expected result is Negative. Fact Sheet for Patients:  PinkCheek.be Fact Sheet for Healthcare Providers:  GravelBags.it This test is not yet ap proved or cleared by the [place] FDA and  has been authorized for detection and/or diagnosis of SARS-CoV-2 by FDA under an Emergency Use Authorization (EUA). This EUA will remain  in effect (meaning this test can be used) for the duration of the COVID-19  declaration under Section 564(b)(1) of the Act, 21 U.S.C. section 360bbb-3(b)(1), unless the authorization is terminated or revoked sooner.    Influenza A by PCR NEGATIVE NEGATIVE   Influenza B by PCR NEGATIVE NEGATIVE    Comment: (NOTE) The Xpert Xpress SARS-CoV-2/FLU/RSV assay is intended as an aid in  the diagnosis of influenza from Nasopharyngeal swab specimens and  should not be used as a sole basis for treatment. Nasal washings and  aspirates are unacceptable for Xpert Xpress SARS-CoV-2/FLU/RSV  testing. Fact Sheet for Patients: PinkCheek.be Fact Sheet for Healthcare Providers: GravelBags.it This test is not yet approved or cleared by the [place] FDA and  has been authorized for detection and/or diagnosis of SARS-CoV-2 by  FDA under an Emergency Use Authorization (EUA). This EUA will remain  in effect (meaning this test can be used) for the duration of the  Covid-19 declaration under Section 564(b)(1) of the Act, 21  U.S.C. section 360bbb-3(b)(1), unless the authorization is  terminated or revoked. Performed at [facility], [place] [address], [place], [place] [address]   Glucose, capillary     Status: Abnormal   Collection Time: 05/29/19 10:41 PM  Result Value Ref Range   Glucose-Capillary 169 (H) 70 - 99 mg/dL   Comment 1 Notify RN    Comment 2 Document in Chart   CBC     Status: Abnormal   Collection Time: 05/30/19  6:28 AM  Result Value Ref Range   WBC 9.7 4.0 - 10.5 K/uL   RBC 4.50 3.87 - 5.11 MIL/uL   Hemoglobin 11.8 (L) 12.0 - 15.0 g/dL   HCT 38.0 36.0 - 46.0 %   MCV 84.4 80.0 - 100.0 fL   MCH 26.2 26.0 - 34.0 pg   MCHC 31.1 30.0 - 36.0 g/dL   RDW 14.7 11.5 - 15.5 %   Platelets 206 150 - 400 K/uL    Comment: REPEATED TO VERIFY PLATELET COUNT CONFIRMED BY SMEAR SPECIMEN CHECKED FOR CLOTS    nRBC 0.0 0.0 - 0.2 %    Comment: Performed at [facility], [place]  [address], [place], [place] [address]  Comprehensive metabolic panel     Status: Abnormal   Collection Time: 05/30/19  6:28 AM  Result Value Ref Range   Sodium 141 135 - 145 mmol/L   Potassium 3.3 (L) 3.5 - 5.1 mmol/L   Chloride 109 98 - 111 mmol/L   CO2 26 22 - 32 mmol/L   Glucose, Bld 86 70 - 99 mg/dL   BUN 7 6 - 20 mg/dL   Creatinine, Ser 0.74 0.44 - 1.00 mg/dL   Calcium 8.8 (L) 8.9 - 10.3 mg/dL   Total Protein 6.6 6.5 - 8.1 g/dL   Albumin 3.6 3.5 - 5.0 g/dL   AST 20 15 - 41 U/L   ALT 20 0 - 44 U/L   Alkaline Phosphatase 28 (L) 38 - 126 U/L   Total Bilirubin 0.6 0.3 - 1.2 mg/dL   GFR calc non Af Amer >60 >60 mL/min   GFR calc Af Amer >60 >60 mL/min   Anion gap 6 5 - 15    Comment: Performed at [facility], [place] [address] [place], [place] [address]  Glucose, capillary     Status: Abnormal   Collection Time: 05/30/19  8:00 AM  Result Value Ref Range   Glucose-Capillary 106 (H) 70 - 99 mg/dL  Glucose, capillary     Status: None   Collection Time: 05/30/19 11:29 AM  Result Value Ref Range   Glucose-Capillary 87 70 - 99 mg/dL  Glucose, capillary     Status: None   Collection Time: 05/30/19  7:38 PM  Result Value Ref Range   Glucose-Capillary 93 70 - 99 mg/dL  Glucose, capillary     Status: Abnormal   Collection Time: 05/31/19  5:52 AM  Result Value Ref Range   Glucose-Capillary 121 (H) 70 - 99 mg/dL    Blood Alcohol level:  Lab Results  Component Value Date   ETH <10 05/19/2019   ETH <10 [id number]    Metabolic Disorder Labs: Lab Results  Component Value Date   HGBA1C 5.2 05/19/2019   MPG 102.54 05/19/2019   MPG 108.28 10/11/2018   Lab Results  Component Value Date   PROLACTIN 5.3 05/19/2019   PROLACTIN 5.2 10/11/2018   Lab Results  Component Value Date   CHOL 172 05/19/2019   TRIG 75 05/19/2019   HDL 45 05/19/2019   CHOLHDL 3.8 05/19/2019   VLDL 15 05/19/2019   LDLCALC 112 (H) 05/19/2019   LDLCALC NOT CALCULATED 10/11/2018     Physical Findings: AIMS: Facial and Oral Movements Muscles of Facial Expression: None, normal Lips and Perioral Area: None, normal Jaw: None, normal Tongue: None, normal,Extremity Movements Upper (arms, wrists, hands, fingers): None, normal Lower (legs, knees, ankles, toes): None, normal, Trunk Movements Neck, shoulders, hips: None, normal, Overall Severity Severity of abnormal movements (highest score from questions above): None, normal Incapacitation due to abnormal movements: None, normal Patient's awareness of abnormal movements (rate only patient's report): No Awareness, Dental Status Current problems with teeth and/or dentures?: No Does patient usually wear dentures?: No  CIWA:  CIWA-Ar Total: 4 COWS:  COWS Total Score: 4  Musculoskeletal: Strength & Muscle Tone: within normal limits Gait & Station: normal Patient leans: N/A  Psychiatric Specialty Exam: Physical Exam  Review of Systems  Blood pressure 132/80, pulse 91, temperature 98.7 F (37.1 C), temperature source Oral, resp. rate 20, SpO2 100 %.There is no height or weight on file to calculate BMI.  General Appearance: Casual  Eye Contact:  Fair  Speech:  Pressured  Volume:  Increased  Mood:  Manic and expansive  Affect:  Congruent, Labile and Full Range  Thought Process:  Linear and Descriptions of Associations: Loose  Orientation:  Full (Time, Place, and Person)  Thought Content:  Denies current auditory or visual hallucinations is more coherent today and displays no paranoid behaviors or thoughts  Suicidal Thoughts:  No  Homicidal Thoughts:  No  Memory:  Immediate;   Fair Recent;   Fair Remote;   Fair  Judgement:  Fair  Insight:  Fair  Psychomotor Activity:  Normal  Concentration:  Concentration: Fair  Recall:  [employer] of Knowledge:  Fair  Language:  Fair  Akathisia:  Negative  Handed:  Right  AIMS (if indicated):     Assets:  Communication Skills Physical Health Resilience Social Support   ADL's:  Intact  Cognition:  WNL  Sleep:  Number of Hours: 6    Treatment Plan Summary: Daily contact with patient to assess and evaluate symptoms and progress in treatment and Medication management  We can certainly change her to Haldol long-acting injectable but will discuss with her act team first We will also add some clonazepam 1 time to help with the mania Add mood stabilizer Continue perphenazine at higher dose until we discussed with PSI No change in precautions  Federick Levene, MD 05/31/2019, 7:57 AM

## 2019-05-31 NOTE — Progress Notes (Signed)
   05/31/19 2200  Psych Admission Type (Psych Patients Only)  Admission Status Voluntary  Psychosocial Assessment  Patient Complaints Anxiety  Eye Contact Fair  Facial Expression Animated;Anxious;Pensive  Affect Anxious;Euphoric;Preoccupied  Speech Logical/coherent;Loud  Interaction Assertive;Demanding;Needy  Actor;Restless  Appearance/Hygiene In hospital gown  Behavior Characteristics Cooperative  Mood Labile  Thought Process  Coherency WDL  Content Obsessions  Delusions None reported or observed  Perception WDL  Hallucination None reported or observed  Judgment Poor  Confusion None  Danger to Self  Current suicidal ideation? Denies  Danger to Others  Danger to Others None reported or observed   Pt very lethargic this evening. Pt falling asleep while taking her blood sugar.

## 2019-05-31 NOTE — Tx Team (Signed)
Interdisciplinary Treatment and Diagnostic Plan Update  05/31/2019 Time of Session: 11:00am [name] MRN: [id number]  Principal Diagnosis: Schizoaffective disorder, bipolar type [facility])  Secondary Diagnoses: Principal Problem:   Schizoaffective disorder, bipolar type ([place]) Active Problems:   Suicidal ideation   Current Medications:  Current Facility-Administered Medications  Medication Dose Route Frequency Provider Last Rate Last Admin  . acetaminophen (TYLENOL) tablet 650 mg  650 mg Oral Q6H PRN Lindon Romp A, NP      . alum & mag hydroxide-simeth (MAALOX/MYLANTA) 200-200-20 MG/5ML suspension 30 mL  30 mL Oral Q4H PRN Lindon Romp A, NP      . ARIPiprazole ER (ABILIFY MAINTENA) injection 400 mg  400 mg Intramuscular Q28 days Johnn Hai, MD      . clonazePAM Bobbye [place]) tablet 2 mg  2 mg Oral BID Johnn Hai, MD   2 mg at 05/31/19 0809  . hydrOXYzine (ATARAX/VISTARIL) tablet 25 mg  25 mg Oral TID PRN Lindon Romp A, NP   25 mg at 05/31/19 0728  . insulin detemir (LEVEMIR) injection 40 Units  40 Units Subcutaneous BID Lindon Romp A, NP   40 Units at 05/31/19 0723  . lithium carbonate (ESKALITH) CR tablet 900 mg  900 mg Oral QHS Lindon Romp A, NP   900 mg at 05/30/19 2103  . LORazepam (ATIVAN) tablet 2 mg  2 mg Oral Q4H PRN Johnn Hai, MD   2 mg at 05/31/19 [id number]   Or  . LORazepam (ATIVAN) injection 2 mg  2 mg Intramuscular Q4H PRN Johnn Hai, MD      . magnesium hydroxide (MILK OF MAGNESIA) suspension 30 mL  30 mL Oral Daily PRN Lindon Romp A, NP      . perphenazine (TRILAFON) tablet 8 mg  8 mg Oral TID Johnn Hai, MD   8 mg at 05/31/19 0723  . propranolol (INDERAL) tablet 10 mg  10 mg Oral BID Lindon Romp A, NP   10 mg at 05/31/19 [id number]   PTA Medications: Medications Prior to Admission  Medication Sig Dispense Refill Last Dose  . diphenhydrAMINE (SOMINEX) 25 MG tablet Take 25 mg by mouth at bedtime as needed for sleep.     Marland Kitchen Fluvoxamine Maleate 150 MG CP24 Take 150  mg by mouth daily.     . insulin detemir (LEVEMIR) 100 UNIT/ML injection Inject 40 Units into the skin 2 (two) times daily.      Marland Kitchen lithium carbonate (ESKALITH) 450 MG CR tablet Take 900 mg by mouth at bedtime.      Marland Kitchen perphenazine (TRILAFON) 2 MG tablet Take 3 tablets (6 mg total) by mouth 2 (two) times daily. 30 tablet 0   . propranolol (INDERAL) 10 MG tablet Take 10 mg by mouth 2 (two) times a day.       Patient Stressors:    Patient Strengths:    Treatment Modalities: Medication Management, Group therapy, Case management,  1 to 1 session with clinician, Psychoeducation, Recreational therapy.   Physician Treatment Plan for Primary Diagnosis: Schizoaffective disorder, bipolar type ([place]) Long Term Goal(s): Improvement in symptoms so as ready for discharge Improvement in symptoms so as ready for discharge   Short Term Goals: Ability to disclose and discuss suicidal ideas Ability to demonstrate self-control will improve Ability to identify and develop effective coping behaviors will improve Ability to maintain clinical measurements within normal limits will improve Ability to identify triggers associated with substance abuse/mental health issues will improve  Medication Management: Evaluate patient's response, side effects, and  tolerance of medication regimen.  Therapeutic Interventions: 1 to 1 sessions, Unit Group sessions and Medication administration.  Evaluation of Outcomes: Not Progressing  Physician Treatment Plan for Secondary Diagnosis: Principal Problem:   Schizoaffective disorder, bipolar type ([place]) Active Problems:   Suicidal ideation  Long Term Goal(s): Improvement in symptoms so as ready for discharge Improvement in symptoms so as ready for discharge   Short Term Goals: Ability to disclose and discuss suicidal ideas Ability to demonstrate self-control will improve Ability to identify and develop effective coping behaviors will improve Ability to maintain clinical  measurements within normal limits will improve Ability to identify triggers associated with substance abuse/mental health issues will improve     Medication Management: Evaluate patient's response, side effects, and tolerance of medication regimen.  Therapeutic Interventions: 1 to 1 sessions, Unit Group sessions and Medication administration.  Evaluation of Outcomes: Not Progressing   RN Treatment Plan for Primary Diagnosis: Schizoaffective disorder, bipolar type ([place]) Long Term Goal(s): Knowledge of disease and therapeutic regimen to maintain health will improve  Short Term Goals: Ability to participate in decision making will improve, Ability to verbalize feelings will improve, Ability to disclose and discuss suicidal ideas, Ability to identify and develop effective coping behaviors will improve and Compliance with prescribed medications will improve  Medication Management: RN will administer medications as ordered by provider, will assess and evaluate patient's response and provide education to patient for prescribed medication. RN will report any adverse and/or side effects to prescribing provider.  Therapeutic Interventions: 1 on 1 counseling sessions, Psychoeducation, Medication administration, Evaluate responses to treatment, Monitor vital signs and CBGs as ordered, Perform/monitor CIWA, COWS, AIMS and Fall Risk screenings as ordered, Perform wound care treatments as ordered.  Evaluation of Outcomes: Not Progressing   LCSW Treatment Plan for Primary Diagnosis: Schizoaffective disorder, bipolar type ([place]) Long Term Goal(s): Safe transition to appropriate next level of care at discharge, Engage patient in therapeutic group addressing interpersonal concerns.  Short Term Goals: Engage patient in aftercare planning with referrals and resources and Increase skills for wellness and recovery  Therapeutic Interventions: Assess for all discharge needs, 1 to 1 time with Social worker, Explore  available resources and support systems, Assess for adequacy in community support network, Educate family and significant other(s) on suicide prevention, Complete Psychosocial Assessment, Interpersonal group therapy.  Evaluation of Outcomes: Not Progressing   Progress in Treatment: Attending groups: No. Participating in groups: No. Taking medication as prescribed: Yes. Toleration medication: Yes. Family/Significant other contact made: No, will contact:  will contact if given consent to contact Patient understands diagnosis: No. Discussing patient identified problems/goals with staff: Yes. Medical problems stabilized or resolved: Yes. Denies suicidal/homicidal ideation: Yes. Issues/concerns per patient self-inventory: No. Other:   New problem(s) identified: No, Describe:  None  New Short Term/Long Term Goal(s): Medication stabilization, elimination of SI thoughts, and development of a comprehensive mental wellness plan.   Patient Goals:  Patient did not come to treatment team. Patient was asleep.   Discharge Plan or Barriers: CSW will continue to follow up for appropriate referrals and possible discharge planning. Patient does have an ACTT team.   Reason for Continuation of Hospitalization: Anxiety Mania Medication stabilization  Estimated Length of Stay: 3-5 days   Attendees: Patient:  05/31/2019   Physician: Dr. Johnn Hai, MD  05/31/2019   Nursing:  05/31/2019   RN Care Manager: 05/31/2019   Social Worker: Ardelle Anton, [place]  05/31/2019   Recreational Therapist:  05/31/2019   Other:  05/31/2019  Other:  05/31/2019   Other: 05/31/2019     Scribe for Treatment Team: Trecia Rogers, LCSW 05/31/2019 1:55 PM

## 2019-05-31 NOTE — Progress Notes (Addendum)
D:  Patient has been resting in bed after ativan 2 mg p.o.  given this morning.   A:  Medications administered per MD orders.  Emotional support and encouragement given patient. R:  Safety maintained with 15 minute checks.

## 2019-05-31 NOTE — BHH Counselor (Signed)
CSW attempted to meet with patient for her assessment. Patient was asleep due to obtaining medications. This was patient's first attempt at PSA.

## 2019-05-31 NOTE — Plan of Care (Signed)
Nurse discussed anxiety and coping skills with patient. 

## 2019-06-01 LAB — GLUCOSE, CAPILLARY
Glucose-Capillary: 93 mg/dL (ref 70–99)
Glucose-Capillary: 99 mg/dL (ref 70–99)
Glucose-Capillary: 137 mg/dL — ABNORMAL HIGH (ref 70–99)
Glucose-Capillary: 131 mg/dL — ABNORMAL HIGH (ref 70–99)

## 2019-06-01 MED ORDER — PERPHENAZINE 2 MG PO TABS
6.0000 mg | ORAL_TABLET | Freq: Three times a day (TID) | ORAL | Status: DC
  Administered 2019-06-01 – 2019-06-02 (×2): 6 mg via ORAL
  Filled 2019-06-01 (×8): qty 3

## 2019-06-01 NOTE — BHH Group Notes (Addendum)
LCSW Group Therapy Notes  Date and Time: 06/01/2019 @ 1:30pm  Type of Therapy and Topic: Group Therapy: Core Beliefs  Participation Level: B[facility] PARTICIPATION LEVEL: Active  Description of Group: In this group patients will be encouraged to explore their negative and positive core beliefs about themselves, others, and the world. Each patient will be challenged to identify these beliefs and ways to challenge negative core beliefs. This group will be process-oriented, with patients participating in exploration of their own experiences as well as giving and receiving support and challenge from other group members.  Therapeutic Goals: 1. Patient will identify personal core beliefs, both negative and positive. 2. Patient will identify core beliefs relating to others, both negative and positive. 3. Patient will challenge their negative beliefs about themselves and others. 4. Patient will identify three changes they can make to replace negative core beliefs with positive beliefs.  Summary of Patient Progress  Due to the COVID-19 pandemic and acuity of unit, this group has been supplemented with worksheets.  Therapeutic Modalities: Cognitive Behavioral Therapy Solution Focused Therapy Motivational Interviewing     Ardelle Anton, MSW, [place]
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
## 2019-06-01 NOTE — Progress Notes (Signed)
[facility] MD Progress Note  06/01/2019 2:03 PM [name]  MRN:  [id number] Subjective:    Patient was a bit sluggish but readily became manic pressured and rambling upon interview she denies current thoughts of harming self No EPS or TD Her act team did clarify her current list of medications with me they include the lithium carbonate 900 mg at bedtime, aripiprazole 400 mg monthly, propranolol 10 mg with uncertain dosing regimen, Luvox 150 mg at bedtime, her Levemir is 40 units subcu twice a day Past psychiatric med trials included Depakote and lithium Lamictal oral aripiprazole Luvox Cymbalta BuSpar Invega Sustenna Trileptal Cogentin of course hydroxyzine Neurontin Principal Problem: Schizoaffective disorder, bipolar type ([place]) Diagnosis: Principal Problem:   Schizoaffective disorder, bipolar type ([place]) Active Problems:   Suicidal ideation  Total Time spent with patient: 20 minutes  Past Psychiatric History: see eval  Past Medical History:  Past Medical History:  Diagnosis Date  . Anxiety   . Bipolar 1 disorder ([place])   . Cognitive deficits   . Depression   . Diabetes mellitus without complication ([place])   . Hypertension   . Mental disorder   . Mental health disorder   . Obesity     Past Surgical History:  Procedure Laterality Date  . CESAREAN SECTION    . CESAREAN SECTION N/A 04/25/2013   Procedure: REPEAT CESAREAN SECTION;  Surgeon: [name], MD;  Location: [facility];  Service: Obstetrics;  Laterality: N/A;  . MASS EXCISION N/A 06/03/2012   Procedure: EXCISION MASS;  Surgeon: [name], MD;  Location: [facility];  Service: ENT;  Laterality: N/A;  Excision uvula mass  . TONSILLECTOMY N/A 06/03/2012   Procedure: TONSILLECTOMY;  Surgeon: [name], MD;  Location: [facility];  Service: ENT;  Laterality: N/A;  . TONSILLECTOMY     Family History:  Family History  Problem Relation Age of Onset  . Hypertension Mother   . Diabetes Father     Family Psychiatric  History: see eval Social History:  Social History   Substance and Sexual Activity  Alcohol Use Never     Social History   Substance and Sexual Activity  Drug Use Never   Comment: Patient denies    Social History   Socioeconomic History  . Marital status: Single    Spouse name: Not on file  . Number of children: Not on file  . Years of education: Not on file  . Highest education level: Not on file  Occupational History  . Not on file  Tobacco Use  . Smoking status: Current Every Day Smoker    Packs/day: 2.00    Years: 11.00    Pack years: 22.00    Types: Cigarettes  . Smokeless tobacco: Never Used  Substance and Sexual Activity  . Alcohol use: Never  . Drug use: Never    Comment: Patient denies  . Sexual activity: Yes    Birth control/protection: Implant  Other Topics Concern  . Not on file  Social History Narrative   ** Merged History Encounter **       Social Determinants of Health   Financial Resource Strain:   . Difficulty of Paying Living Expenses: Not on file  Food Insecurity:   . Worried About Charity fundraiser in the Last Year: Not on file  . Ran Out of Food in the Last Year: Not on file  Transportation Needs:   . Lack of Transportation (Medical): Not on file  . Lack of  Transportation (Non-Medical): Not on file  Physical Activity:   . Days of Exercise per Week: Not on file  . Minutes of Exercise per Session: Not on file  Stress:   . Feeling of Stress : Not on file  Social Connections:   . Frequency of Communication with Friends and Family: Not on file  . Frequency of Social Gatherings with Friends and Family: Not on file  . Attends Religious Services: Not on file  . Active Member of Clubs or Organizations: Not on file  . Attends Archivist Meetings: Not on file  . Marital Status: Not on file   Additional Social History:    Pain Medications: See MAR Prescriptions: See MAR Over the Counter: See MAR History  of alcohol / drug use?: Yes Name of Substance 1: Cigarettes. 1 - Age of First Use: UTA 1 - Amount (size/oz): Pt reported, smoking five packs of cigerettes, daily. 1 - Frequency: Daily. 1 - Duration: Ongoing. 1 - Last Use / Amount: Daily. Name of Substance 2: Alcohol. 2 - Age of First Use: UTA 2 - Amount (size/oz): Pt reported, drinking once Four Loco and three Mickey's (beers), yesteday. 2 - Frequency: Ongoing. 2 - Duration: Ongoing. 2 - Last Use / Amount: Yesterday (05/28/2019).                Sleep: Fair  Appetite:  Fair  Current Medications: Current Facility-Administered Medications  Medication Dose Route Frequency Provider Last Rate Last Admin  . acetaminophen (TYLENOL) tablet 650 mg  650 mg Oral Q6H PRN Lindon Romp A, NP      . alum & mag hydroxide-simeth (MAALOX/MYLANTA) 200-200-20 MG/5ML suspension 30 mL  30 mL Oral Q4H PRN Lindon Romp A, NP      . ARIPiprazole ER (ABILIFY MAINTENA) injection 400 mg  400 mg Intramuscular Q28 days Johnn Hai, MD   400 mg at 05/31/19 1616  . clonazePAM (KLONOPIN) tablet 2 mg  2 mg Oral BID Johnn Hai, MD   2 mg at 06/01/19 [id number]  . hydrOXYzine (ATARAX/VISTARIL) tablet 25 mg  25 mg Oral TID PRN Rozetta Nunnery, NP   25 mg at 05/31/19 2251  . insulin detemir (LEVEMIR) injection 40 Units  40 Units Subcutaneous BID Lindon Romp A, NP   40 Units at 06/01/19 0740  . lithium carbonate (ESKALITH) CR tablet 900 mg  900 mg Oral QHS Lindon Romp A, NP   900 mg at 05/31/19 2251  . LORazepam (ATIVAN) tablet 2 mg  2 mg Oral Q4H PRN Johnn Hai, MD   2 mg at 06/01/19 0739   Or  . LORazepam (ATIVAN) injection 2 mg  2 mg Intramuscular Q4H PRN Johnn Hai, MD      . magnesium hydroxide (MILK OF MAGNESIA) suspension 30 mL  30 mL Oral Daily PRN Lindon Romp A, NP      . perphenazine (TRILAFON) tablet 6 mg  6 mg Oral TID Johnn Hai, MD      . propranolol (INDERAL) tablet 10 mg  10 mg Oral BID Lindon Romp A, NP   10 mg at 06/01/19 [id number]    Lab  Results:  Results for orders placed or performed during the hospital encounter of 05/29/19 (from the past 48 hour(s))  Glucose, capillary     Status: None   Collection Time: 05/30/19  7:38 PM  Result Value Ref Range   Glucose-Capillary 93 70 - 99 mg/dL  Glucose, capillary     Status: Abnormal   Collection Time: 05/31/19  5:52 AM  Result Value Ref Range   Glucose-Capillary 121 (H) 70 - 99 mg/dL  Glucose, capillary     Status: None   Collection Time: 05/31/19 11:41 AM  Result Value Ref Range   Glucose-Capillary 95 70 - 99 mg/dL  Glucose, capillary     Status: None   Collection Time: 05/31/19  4:50 PM  Result Value Ref Range   Glucose-Capillary 87 70 - 99 mg/dL  Glucose, capillary     Status: None   Collection Time: 05/31/19  7:39 PM  Result Value Ref Range   Glucose-Capillary 99 70 - 99 mg/dL  Glucose, capillary     Status: None   Collection Time: 06/01/19  6:23 AM  Result Value Ref Range   Glucose-Capillary 93 70 - 99 mg/dL  Glucose, capillary     Status: None   Collection Time: 06/01/19 12:17 PM  Result Value Ref Range   Glucose-Capillary 99 70 - 99 mg/dL    Blood Alcohol level:  Lab Results  Component Value Date   ETH <10 05/19/2019   ETH <10 [id number]    Metabolic Disorder Labs: Lab Results  Component Value Date   HGBA1C 5.2 05/19/2019   MPG 102.54 05/19/2019   MPG 108.28 10/11/2018   Lab Results  Component Value Date   PROLACTIN 5.3 05/19/2019   PROLACTIN 5.2 10/11/2018   Lab Results  Component Value Date   CHOL 172 05/19/2019   TRIG 75 05/19/2019   HDL 45 05/19/2019   CHOLHDL 3.8 05/19/2019   VLDL 15 05/19/2019   LDLCALC 112 (H) 05/19/2019   LDLCALC NOT CALCULATED 10/11/2018    Physical Findings: AIMS: Facial and Oral Movements Muscles of Facial Expression: None, normal Lips and Perioral Area: None, normal Jaw: None, normal Tongue: None, normal,Extremity Movements Upper (arms, wrists, hands, fingers): None, normal Lower (legs, knees, ankles,  toes): None, normal, Trunk Movements Neck, shoulders, hips: None, normal, Overall Severity Severity of abnormal movements (highest score from questions above): None, normal Incapacitation due to abnormal movements: None, normal Patient's awareness of abnormal movements (rate only patient's report): No Awareness, Dental Status Current problems with teeth and/or dentures?: No Does patient usually wear dentures?: No  CIWA:  CIWA-Ar Total: 4 COWS:  COWS Total Score: 4  Musculoskeletal: Strength & Muscle Tone: within normal limits Gait & Station: normal Patient leans: N/A  Psychiatric Specialty Exam: Physical Exam  Review of Systems  Blood pressure 119/73, pulse (!) 101, temperature 98.7 F (37.1 C), temperature source Oral, resp. rate 20, SpO2 100 %.There is no height or weight on file to calculate BMI.  General Appearance: Bizarre and Casual  Eye Contact:  Good  Speech:  Clear and Coherent and Pressured  Volume:  Increased  Mood: Generally hypomanic  Affect:  Full Range  Thought Process:  Coherent, Goal Directed and Descriptions of Associations: Loose  Orientation:  Full (Time, Place, and Person)  Thought Content:  Flight of ideas denies hallucinations  Suicidal Thoughts:  No  Homicidal Thoughts:  No  Memory:  Immediate;   Fair Recent;   Fair  Judgement:  Fair  Insight:  Fair  Psychomotor Activity:  Normal  Concentration:  Concentration: Fair and Attention Span: Fair  Recall:  [employer] of Knowledge:  Fair  Language:  Fair  Akathisia:  Negative  Handed:  Right  AIMS (if indicated):     Assets:  Resilience Social Support  ADL's:  Intact  Cognition:  WNL  Sleep:  Number of Hours: 9  Treatment Plan Summary: Daily contact with patient to assess and evaluate symptoms and progress in treatment and Medication management  Continue current med management decrease the dose of perphenazine due to sluggishness no change in precautions continue to stay in touch with act  team  Johnn Hai, MD 06/01/2019, 2:03 PM

## 2019-06-01 NOTE — [facility] Suicide Risk Assessment (Addendum)
[facility] INPATIENT:  Family/Significant Other Suicide Prevention Education  Suicide Prevention Education:  Contact Attempts: with mother, Jacey Buddenhagen, [phone] has been identified by the patient as the family member/significant other with whom the patient will be residing, and identified as the person(s) who will aid the patient in the event of a mental health crisis.  With written consent from the patient, two attempts were made to provide suicide prevention education, prior to and/or following the patient's discharge.  We were unsuccessful in providing suicide prevention education.  A suicide education pamphlet was given to the patient to share with family/significant other.  Date and time of first attempt: 06/01/19 at 2:50pm  Date and time of second attempt: 06/02/19 @ 9am   CSW attempted to contact pt's mother, Armonni Skogen, twice but was unsuccessful.   Billey Chang 06/01/2019, 2:52 PM

## 2019-06-01 NOTE — Progress Notes (Signed)
Pt had episodes where she would become loud and intrusive. Pt was redirectable this shift. Pt Had episode of lightheadedness around 1800.  Pt went back to bed and RN encouraged fluids.      06/01/19 1030  Psych Admission Type (Psych Patients Only)  Admission Status Voluntary  Psychosocial Assessment  Patient Complaints Anxiety  Eye Contact Fair  Facial Expression Animated;Anxious;Pensive  Affect Anxious;Euphoric;Preoccupied  Speech Logical/coherent;Loud  Interaction Assertive;Demanding;Needy  Actor;Restless  Appearance/Hygiene Unremarkable  Behavior Characteristics Intrusive  Mood Labile  Thought Process  Coherency WDL  Content Obsessions  Delusions None reported or observed  Perception WDL  Hallucination None reported or observed  Judgment Poor  Confusion None  Danger to Self  Current suicidal ideation? Denies  Danger to Others  Danger to Others None reported or observed

## 2019-06-01 NOTE — Progress Notes (Signed)
   06/01/19 2300  Psych Admission Type (Psych Patients Only)  Admission Status Voluntary  Psychosocial Assessment  Patient Complaints Anxiety  Eye Contact Fair  Facial Expression Animated;Anxious;Pensive  Affect Anxious;Euphoric;Preoccupied  Speech Logical/coherent;Loud  Interaction Assertive;Demanding;Needy  Actor;Restless  Appearance/Hygiene Unremarkable  Behavior Characteristics Intrusive  Mood Labile  Thought Process  Coherency WDL  Content Obsessions  Delusions None reported or observed  Perception WDL  Hallucination None reported or observed  Judgment Poor  Confusion None  Danger to Self  Current suicidal ideation? Denies  Danger to Others  Danger to Others None reported or observed     06/01/19 2300  Psych Admission Type (Psych Patients Only)  Admission Status Voluntary  Psychosocial Assessment  Patient Complaints Anxiety  Eye Contact Fair  Facial Expression Animated;Anxious;Pensive  Affect Anxious;Euphoric;Preoccupied  Speech Logical/coherent;Loud  Interaction Assertive;Demanding;Needy  Actor;Restless  Appearance/Hygiene Unremarkable  Behavior Characteristics Intrusive  Mood Labile  Thought Process  Coherency WDL  Content Obsessions  Delusions None reported or observed  Perception WDL  Hallucination None reported or observed  Judgment Poor  Confusion None  Danger to Self  Current suicidal ideation? Denies  Danger to Others  Danger to Others None reported or observed

## 2019-06-01 NOTE — Progress Notes (Signed)
Recreation Therapy Notes  Date: 2.4.21 Time: 1000 Location: [address] Hall Dayroom  Group Topic: Self-Esteem  Goal Area(s) Addresses:  Patient will successfully identify positive attributes about themselves.  Patient will successfully identify benefit of improved self-esteem.   Intervention: Blank masks, colored pencils, glue sticks, magazines and scissors  Activity: Art Masks.  Patients were to take a blank mask and create a mask that highlights some of the positive qualities and attributes they have.  Patients could also use the magazines to find pictures/words that explain positive qualities about them.    Education:  Self-Esteem, Dentist.   Education Outcome: Acknowledges education/In group clarification offered/Needs additional education  Clinical Observations/Feedback: Pt did not attend group.    [name], LRT/CTRS         Ria Comment, Simon Aaberg A 06/01/2019 11:13 AM

## 2019-06-01 NOTE — Progress Notes (Signed)
Psychoeducational Group Note  Date:  06/01/2019 Time:  2024  Group Topic/Focus:  Wrap-Up Group:   The focus of this group is to help patients review their daily goal of treatment and discuss progress on daily workbooks.  Participation Level: Did Not Attend  Participation Quality:  Not Applicable  Affect:  Not Applicable  Cognitive:  Not Applicable  Insight:  Not Applicable  Engagement in Group: Not Applicable  Additional Comments:  The patient did not attend group this evening.   Archie Balboa S 06/01/2019, 8:24 PM

## 2019-06-02 LAB — GLUCOSE, CAPILLARY: Glucose-Capillary: 181 mg/dL — ABNORMAL HIGH (ref 70–99)

## 2019-06-02 MED ORDER — ARIPIPRAZOLE ER 400 MG IM SRER: 400.0000 mg | INTRAMUSCULAR | 11 refills | Status: DC

## 2019-06-02 MED ORDER — PERPHENAZINE 8 MG PO TABS: 8.0000 mg | ORAL_TABLET | Freq: Two times a day (BID) | ORAL | 2 refills | Status: DC

## 2019-06-02 MED ORDER — PROPRANOLOL HCL 10 MG PO TABS: 10.0000 mg | ORAL_TABLET | Freq: Two times a day (BID) | ORAL | 2 refills | Status: DC

## 2019-06-02 MED ORDER — LITHIUM CARBONATE ER 450 MG PO TBCR: 900.0000 mg | EXTENDED_RELEASE_TABLET | Freq: Every day | ORAL | 2 refills | Status: DC

## 2019-06-02 NOTE — Tx Team (Signed)
Interdisciplinary Treatment and Diagnostic Plan Update  06/02/2019 Time of Session: 10:20am  [name] MRN: [id number]  Principal Diagnosis: Schizoaffective disorder, bipolar type [facility])  Secondary Diagnoses: Principal Problem:   Schizoaffective disorder, bipolar type ([place]) Active Problems:   Suicidal ideation   Current Medications:  Current Facility-Administered Medications  Medication Dose Route Frequency Provider Last Rate Last Admin  . acetaminophen (TYLENOL) tablet 650 mg  650 mg Oral Q6H PRN Lindon Romp A, NP      . alum & mag hydroxide-simeth (MAALOX/MYLANTA) 200-200-20 MG/5ML suspension 30 mL  30 mL Oral Q4H PRN Lindon Romp A, NP      . ARIPiprazole ER (ABILIFY MAINTENA) injection 400 mg  400 mg Intramuscular Q28 days Johnn Hai, MD   400 mg at 05/31/19 1616  . hydrOXYzine (ATARAX/VISTARIL) tablet 25 mg  25 mg Oral TID PRN Rozetta Nunnery, NP   25 mg at 06/01/19 2224  . insulin detemir (LEVEMIR) injection 40 Units  40 Units Subcutaneous BID Lindon Romp A, NP   40 Units at 06/02/19 0737  . lithium carbonate (ESKALITH) CR tablet 900 mg  900 mg Oral QHS Lindon Romp A, NP   900 mg at 06/01/19 2224  . LORazepam (ATIVAN) tablet 2 mg  2 mg Oral Q4H PRN Johnn Hai, MD   2 mg at 06/02/19 0734   Or  . LORazepam (ATIVAN) injection 2 mg  2 mg Intramuscular Q4H PRN Johnn Hai, MD      . magnesium hydroxide (MILK OF MAGNESIA) suspension 30 mL  30 mL Oral Daily PRN Lindon Romp A, NP      . perphenazine (TRILAFON) tablet 6 mg  6 mg Oral TID Johnn Hai, MD   6 mg at 06/02/19 0734  . propranolol (INDERAL) tablet 10 mg  10 mg Oral BID Lindon Romp A, NP   10 mg at 06/02/19 [id number]   PTA Medications: Medications Prior to Admission  Medication Sig Dispense Refill Last Dose  . diphenhydrAMINE (SOMINEX) 25 MG tablet Take 25 mg by mouth at bedtime as needed for sleep.     Marland Kitchen Fluvoxamine Maleate 150 MG CP24 Take 150 mg by mouth daily.     . insulin detemir (LEVEMIR) 100 UNIT/ML  injection Inject 40 Units into the skin 2 (two) times daily.      Marland Kitchen lithium carbonate (ESKALITH) 450 MG CR tablet Take 900 mg by mouth at bedtime.      Marland Kitchen perphenazine (TRILAFON) 2 MG tablet Take 3 tablets (6 mg total) by mouth 2 (two) times daily. 30 tablet 0   . propranolol (INDERAL) 10 MG tablet Take 10 mg by mouth 2 (two) times a day.       Patient Stressors:    Patient Strengths:    Treatment Modalities: Medication Management, Group therapy, Case management,  1 to 1 session with clinician, Psychoeducation, Recreational therapy.   Physician Treatment Plan for Primary Diagnosis: Schizoaffective disorder, bipolar type ([place]) Long Term Goal(s): Improvement in symptoms so as ready for discharge Improvement in symptoms so as ready for discharge   Short Term Goals: Ability to disclose and discuss suicidal ideas Ability to demonstrate self-control will improve Ability to identify and develop effective coping behaviors will improve Ability to maintain clinical measurements within normal limits will improve Ability to identify triggers associated with substance abuse/mental health issues will improve  Medication Management: Evaluate patient's response, side effects, and tolerance of medication regimen.  Therapeutic Interventions: 1 to 1 sessions, Unit Group sessions and Medication administration.  Evaluation of Outcomes: Adequate for Discharge  Physician Treatment Plan for Secondary Diagnosis: Principal Problem:   Schizoaffective disorder, bipolar type ([place]) Active Problems:   Suicidal ideation  Long Term Goal(s): Improvement in symptoms so as ready for discharge Improvement in symptoms so as ready for discharge   Short Term Goals: Ability to disclose and discuss suicidal ideas Ability to demonstrate self-control will improve Ability to identify and develop effective coping behaviors will improve Ability to maintain clinical measurements within normal limits will improve Ability to  identify triggers associated with substance abuse/mental health issues will improve     Medication Management: Evaluate patient's response, side effects, and tolerance of medication regimen.  Therapeutic Interventions: 1 to 1 sessions, Unit Group sessions and Medication administration.  Evaluation of Outcomes: Adequate for Discharge   RN Treatment Plan for Primary Diagnosis: Schizoaffective disorder, bipolar type ([place]) Long Term Goal(s): Knowledge of disease and therapeutic regimen to maintain health will improve  Short Term Goals: Ability to participate in decision making will improve, Ability to verbalize feelings will improve, Ability to disclose and discuss suicidal ideas, Ability to identify and develop effective coping behaviors will improve and Compliance with prescribed medications will improve  Medication Management: RN will administer medications as ordered by provider, will assess and evaluate patient's response and provide education to patient for prescribed medication. RN will report any adverse and/or side effects to prescribing provider.  Therapeutic Interventions: 1 on 1 counseling sessions, Psychoeducation, Medication administration, Evaluate responses to treatment, Monitor vital signs and CBGs as ordered, Perform/monitor CIWA, COWS, AIMS and Fall Risk screenings as ordered, Perform wound care treatments as ordered.  Evaluation of Outcomes: Adequate for Discharge   LCSW Treatment Plan for Primary Diagnosis: Schizoaffective disorder, bipolar type ([place]) Long Term Goal(s): Safe transition to appropriate next level of care at discharge, Engage patient in therapeutic group addressing interpersonal concerns.  Short Term Goals: Engage patient in aftercare planning with referrals and resources and Increase skills for wellness and recovery  Therapeutic Interventions: Assess for all discharge needs, 1 to 1 time with Social worker, Explore available resources and support systems,  Assess for adequacy in community support network, Educate family and significant other(s) on suicide prevention, Complete Psychosocial Assessment, Interpersonal group therapy.  Evaluation of Outcomes: Adequate for Discharge   Progress in Treatment: Attending groups: Yes. Participating in groups: Yes. Taking medication as prescribed: Yes. Toleration medication: Yes. Family/Significant other contact made: Yes, individual(s) contacted:  pt's mother  Patient understands diagnosis: Yes. Discussing patient identified problems/goals with staff: Yes. Medical problems stabilized or resolved: Yes. Denies suicidal/homicidal ideation: Yes. Issues/concerns per patient self-inventory: No. Other:   New problem(s) identified: No, Describe:  none   New Short Term/Long Term Goal(s): Medication stabilization, elimination of SI thoughts, and development of a comprehensive mental wellness plan.   Patient Goals:  Patient did not come to treatment team. Patient was asleep.   Discharge Plan or Barriers: pt is adequate for discharge   Reason for Continuation of Hospitalization: Anxiety Mania Medication stabilization  Estimated Length of Stay: pt is adequate for discharge   Attendees: Patient:  06/02/2019   Physician: [name], MD 06/02/2019   Nursing:  06/02/2019   RN Care Manager: 06/02/2019   Social Worker: Ardelle Anton, LCSW 06/02/2019   Recreational Therapist:  06/02/2019   Other: Ovidio Kin, MSW intern  06/02/2019   Other: Sharl Ma, NP intern  06/02/2019   Other: 06/02/2019     Scribe for Treatment Team: Billey Chang, Student-Social Work 06/02/2019 10:34 AM

## 2019-06-02 NOTE — Plan of Care (Signed)
Pt was preoccupied with discharging during the recreation therapy group session.    [name], LRT/CTRS

## 2019-06-02 NOTE — Progress Notes (Signed)
Recreation Therapy Notes  INPATIENT RECREATION TR PLAN  Patient Details Name: Arleny Kruger MRN: [id number] DOB: Oct 04, 1990 Today's Date: 06/02/2019  Rec Therapy Plan Is patient appropriate for Therapeutic Recreation?: Yes Treatment times per week: about 3 days Estimated Length of Stay: 5-7 days TR Treatment/Interventions: Group participation (Comment)  Discharge Criteria Pt will be discharged from therapy if:: Discharged Treatment plan/goals/alternatives discussed and agreed upon by:: Patient/family  Discharge Summary Short term goals set: See patient care plan Short term goals met: Not met Progress toward goals comments: Groups attended Which groups?: Other (Comment)(Tr[facility]gers) Reason goals not met: about 3 days Therapeutic equipment acquired: 5-7 days Reason patient discharged from therapy: Discharge from hospital Pt/family agrees with progress & goals achieved: Yes Date patient discharged from therapy: 06/02/19   [name], LRT/CTRS  Ria Comment, Laniqua Torrens A 06/02/2019, 1:37 PM

## 2019-06-02 NOTE — Discharge Summary (Signed)
Physician Discharge Summary Note  Patient:  [name] is an 29 y.o., female MRN:  [id number] DOB:  [birth year] Patient phone:  [phone] (home)  Patient address:   [address] [place] [address],  Total Time spent with patient: 45 minutes  Date of Admission:  05/29/2019 Date of Discharge: 06/02/2019  Reason for Admission:    This is the latest of multiple admissions and healthcare system encounters for [name], she clearly requires inpatient stabilization.  She presented on 2/1 in a manic state of mind endorsing numerous symptoms to include paranoia, believing people are going to find her, as suffering from auditory hallucinations and seeing people "laugh at her" reporting suicidal thoughts, and she had phoned her act team who recommended she come for an inpatient evaluation.  She had been moved from a motel to transitional housing but she has suffered this exacerbation prior to being placed in independent living.  Immediately upon arriving on the ward she begins singing gospel music and dancing, singing and talking very loudly, is extremely disruptive to the milieu, and agitating to the other patients.  She had required IM medications almost immediately so as not to escalate the acuity of the milieu.  She had been stabilized in the past with long-acting injectable aripiprazole, her current med list includes perphenazine at 12 mg a day.  She is unable to name her current medications however.  She is too disorganized.  She is currently alert oriented to person place situation again almost impossible to redirect due to her level of agitation/mania/singing behaviors. She had reported auditory and visual hallucinations recently she does not answer the question currently.  She does deny wanting to harm self or wanting to harm others during my interview.  Despite her disorganization and mania she understands what it means to contract for safety.  Drug screen negative for all  compounds tested gave me contradictory information however on the amount of alcohol she is ingesting -seems she does abuse alcohol in a binge type fashion/alcohol level negligible  According to assessment team notes  History of Present Illness: TTS Assessment: [name] an 29 y.o.female, who presents voluntary and unaccompanied to [facility]Clinician asked the pt, "what brought you to the hospital?"Pt reported, hearing people talking about her, calling her "crazy, old ass, a bald headed bitch." Pt reported, seeing and heating people laugh at her. Pt reported, feeling paranoid, someone is trying to fight her at the bus depot. Pt reported, she has OCD, that is why she shaves he head. Pt reported, hearing the voices and seeing people laugh at her made her feel suicidal. Pt reported, five previous suicide attempts. Pt reported, she was going to take medications for Schizophrenia but she called her ACT Team who recommended she come to [facility]. Pt reported, her ACT Team arranged she moved from a motel to [employer], with the goal of independent living (pt's own place) in two months. Pt denies, HI and self-injurious behaviors and access to weapons.   Ptreported, smoking five packs of cigarettes, daily. Pt reported, drinking once [employer] and three Mickey's (beers),yesterday.[facility], for medication management. Per chart, pt has previous inpatient admissions.  Pt presents alert with loud speech. Pt's eye contact was good. Pt's mood was pleasant, anxious. Pt's affect was congruent with mood. Pt's thought process was coherent, relevant. Pt's judgement was partial. Pt was oriented x4. Pt's concentration normal. Pt's insight and impulse control was fair. Pt reported, if discharged from [facility] she could not contract for safety, (she would hurt herself.)   *Pt declined clinician to contact anyone (family, friend supports) to obtain  collateral information.*  Evaluation on Unit: Reviewed TTS assessment and validated with patient.Reports that she takes lithium and perphenazine as prescribed. Reports that she takes levemir 40 units BID, she has not taken insulin today.On evaluation patient is alert and oriented x 4, pleasant, and cooperative. Speech is clear and coherent. Mood is depressed and affect is congruent with mood. Thought process is coherent. She reports feeling paranoid. States that she has auditory hallucinations. States that one of the voices told her to shave her head.No indication that patient is responding to internal stimuli. Denies homicidal ideations. Denies substance abuse.  Principal Problem: Schizoaffective disorder, bipolar type [facility]) Discharge Diagnoses: Principal Problem:   Schizoaffective disorder, bipolar type ([place]) Active Problems:   Suicidal ideation   Past Psychiatric History: Extensive  Past Medical History:  Past Medical History:  Diagnosis Date  . Anxiety   . Bipolar 1 disorder ([place])   . Cognitive deficits   . Depression   . Diabetes mellitus without complication ([place])   . Hypertension   . Mental disorder   . Mental health disorder   . Obesity     Past Surgical History:  Procedure Laterality Date  . CESAREAN SECTION    . CESAREAN SECTION N/A 04/25/2013   Procedure: REPEAT CESAREAN SECTION;  Surgeon: [name], MD;  Location: [facility];  Service: Obstetrics;  Laterality: N/A;  . MASS EXCISION N/A 06/03/2012   Procedure: EXCISION MASS;  Surgeon: [name], MD;  Location: [facility];  Service: ENT;  Laterality: N/A;  Excision uvula mass  . TONSILLECTOMY N/A 06/03/2012   Procedure: TONSILLECTOMY;  Surgeon: [name], MD;  Location: [facility];  Service: ENT;  Laterality: N/A;  . TONSILLECTOMY     Family History:  Family History  Problem Relation Age of Onset  . Hypertension Mother   . Diabetes Father    Family Psychiatric  History:  See eval Social History:  Social History   Substance and Sexual Activity  Alcohol Use Never     Social History   Substance and Sexual Activity  Drug Use Never   Comment: Patient denies    Social History   Socioeconomic History  . Marital status: Single    Spouse name: Not on file  . Number of children: Not on file  . Years of education: Not on file  . Highest education level: Not on file  Occupational History  . Not on file  Tobacco Use  . Smoking status: Current Every Day Smoker    Packs/day: 2.00    Years: 11.00    Pack years: 22.00    Types: Cigarettes  . Smokeless tobacco: Never Used  Substance and Sexual Activity  . Alcohol use: Never  . Drug use: Never    Comment: Patient denies  . Sexual activity: Yes    Birth control/protection: Implant  Other Topics Concern  . Not on file  Social History Narrative   ** Merged History Encounter **       Social Determinants of Health   Financial Resource Strain:   . Difficulty of Paying Living Expenses: Not on file  Food Insecurity:   . Worried About Charity fundraiser in the Last Year: Not on file  . Ran Out of Food in the Last Year: Not on file  Transportation Needs:   . Lack of Transportation (Medical):  Not on file  . Lack of Transportation (Non-Medical): Not on file  Physical Activity:   . Days of Exercise per Week: Not on file  . Minutes of Exercise per Session: Not on file  Stress:   . Feeling of Stress : Not on file  Social Connections:   . Frequency of Communication with Friends and Family: Not on file  . Frequency of Social Gatherings with Friends and Family: Not on file  . Attends Religious Services: Not on file  . Active Member of Clubs or Organizations: Not on file  . Attends Archivist Meetings: Not on file  . Marital Status: Not on file    Hospital Course:    As discussed patient was admitted in a manic state of mind she was however compliant with medications.  She was uncertain of  her baseline medications and it appeared that she had not been on the perphenazine that was listed as a home med but we had already started it and she began to respond to it.  She improved over the next few days.  By the date of the fifth she had recalibrated to her baseline in the sense she was alert oriented to person place time situation requesting discharge and had no manic symptoms.  She had no racing thoughts, her thoughts were coherent and goal-directed, she had no auditory or visual hallucinations, she was not hyper religious or inappropriate.  Again no thoughts of harming self or others. We will inform her act team.  They had requested we continue her long-acting injectable aripiprazole, further we will continue the perphenazine, since she is allergic to benztropine she is advised to take Benadryl if she has stiffness, and finally we continued lithium, her level was therapeutic at 0.62 when it was checked in June, and further we discontinued fluvoxamine fearing it might of exacerbated mania.   Physical Findings: AIMS: Facial and Oral Movements Muscles of Facial Expression: None, normal Lips and Perioral Area: None, normal Jaw: None, normal Tongue: None, normal,Extremity Movements Upper (arms, wrists, hands, fingers): None, normal Lower (legs, knees, ankles, toes): None, normal, Trunk Movements Neck, shoulders, hips: None, normal, Overall Severity Severity of abnormal movements (highest score from questions above): None, normal Incapacitation due to abnormal movements: None, normal Patient's awareness of abnormal movements (rate only patient's report): No Awareness, Dental Status Current problems with teeth and/or dentures?: No Does patient usually wear dentures?: No  CIWA:  CIWA-Ar Total: 4 COWS:  COWS Total Score: 4  Musculoskeletal: Strength & Muscle Tone: within normal limits Gait & Station: normal Patient leans: N/A  Psychiatric Specialty Exam: Physical Exam  Review of  Systems  Blood pressure 107/61, pulse (!) 108, temperature 98.7 F (37.1 C), temperature source Oral, resp. rate 20, SpO2 100 %.There is no height or weight on file to calculate BMI.  General Appearance: Casual  Eye Contact:  Good  Speech:  Clear and Coherent  Volume:  Increased  Mood:  Euthymic  Affect:  Full Range  Thought Process:  Goal Directed  Orientation:  Full (Time, Place, and Person)  Thought Content:  Tangential  Suicidal Thoughts:  No  Homicidal Thoughts:  No  Memory:  Immediate;   Fair Recent;   Fair  Judgement:  Intact  Insight:  Present  Psychomotor Activity:  Normal  Concentration:  Concentration: Fair and Attention Span: Fair  Recall:  [employer] of Knowledge:  Fair  Language:  Fair  Akathisia:  Negative  Handed:  Right  AIMS (if indicated):  Assets:  Communication Skills Desire for Improvement  ADL's:  Intact  Cognition:  WNL  Sleep:  Number of Hours: 10.25        Has this patient used any form of tobacco in the last 30 days? (Cigarettes, Smokeless Tobacco, Cigars, and/or Pipes) Yes, No  Blood Alcohol level:  Lab Results  Component Value Date   ETH <10 05/19/2019   ETH <10 99991111    Metabolic Disorder Labs:  Lab Results  Component Value Date   HGBA1C 5.2 05/19/2019   MPG 102.54 05/19/2019   MPG 108.28 10/11/2018   Lab Results  Component Value Date   PROLACTIN 5.3 05/19/2019   PROLACTIN 5.2 10/11/2018   Lab Results  Component Value Date   CHOL 172 05/19/2019   TRIG 75 05/19/2019   HDL 45 05/19/2019   CHOLHDL 3.8 05/19/2019   VLDL 15 05/19/2019   LDLCALC 112 (H) 05/19/2019   LDLCALC NOT CALCULATED 10/11/2018    See Psychiatric Specialty Exam and Suicide Risk Assessment completed by Attending Physician prior to discharge.  Discharge destination:  Home  Is patient on multiple antipsychotic therapies at discharge:  No   Has Patient had three or more failed trials of antipsychotic monotherapy by history:  No  Recommended  Plan for Multiple Antipsychotic Therapies: Additional reason(s) for multiple antispychotic treatment:  Overlap of long-acting injectable and perphenazine at least 14 days   Allergies as of 06/02/2019      Reactions   Wellbutrin [bupropion] Shortness Of Breath   Omnipaque [iohexol] Swelling, Other (See Comments)   Reaction:  Eye swelling   Penicillins Hives, Other (See Comments)   Has patient had a PCN reaction causing immediate rash, facial/tongue/throat swelling, SOB or lightheadedness with hypotension: Unknown Has patient had a PCN reaction causing severe rash involving mucus membranes or skin necrosis: Yes Has patient had a PCN reaction that required hospitalization Unknown Has patient had a PCN reaction occurring within the last 10 years: No If all of the above answers are "NO", then may proceed with Cephalosporin use.   Cogentin [benztropine]    Make pt feel crazy   Depakote Er [divalproex Sodium Er] Nausea And Vomiting   Divalproex Sodium    Iohexol       Medication List    STOP taking these medications   Fluvoxamine Maleate 150 MG Cp24     TAKE these medications     Indication  ARIPiprazole ER 400 MG Srer injection Commonly known as: ABILIFY MAINTENA Inject 2 mLs (400 mg total) into the muscle every 28 (twenty-eight) days. Start taking on: June 28, 2019  Indication: MIXED BIPOLAR AFFECTIVE DISORDER   diphenhydrAMINE 25 MG tablet Commonly known as: SOMINEX Take 25 mg by mouth at bedtime as needed for sleep.  Indication: Trouble Sleeping   insulin detemir 100 UNIT/ML injection Commonly known as: LEVEMIR Inject 40 Units into the skin 2 (two) times daily.  Indication: Type 2 Diabetes   lithium carbonate 450 MG CR tablet Commonly known as: ESKALITH Take 2 tablets (900 mg total) by mouth at bedtime.  Indication: Depressive Phase of Manic-Depression   perphenazine 8 MG tablet Commonly known as: TRILAFON Take 1 tablet (8 mg total) by mouth 2 (two) times daily. What  changed:   medication strength  how much to take  Indication: Schizophrenia   propranolol 10 MG tablet Commonly known as: INDERAL Take 1 tablet (10 mg total) by mouth 2 (two) times daily. What changed: when to take this  Indication: High Blood Pressure Disorder,  Lithium-Induced Tremor      Follow-up [facility].   Contact information: [place] [address] [phone]          Signed: Johnn Hai, MD 06/02/2019, 8:08 AM

## 2019-06-02 NOTE — Progress Notes (Signed)
Recreation Therapy Notes  Date: 2.5.21 Time: 1000 Location: 500 Hall Dayroom  Group Topic: Triggers  Goal Area(s) Addresses:  Patient will identify their 3 biggest triggers. Patient will verbalize how to avoid triggers. Patient will verbalize how to deal with triggers head on.  Behavioral Response: Minimal  Intervention: Worksheet, music  Activity: Triggers.  Patients were to identify their 3 biggest triggers.  Patients then identified how to deal with triggers head on and how to avoid triggers.  Education: Triggers, Discharge Planning  Education Outcome: Acknowledges understanding/In group clarification offered/Needs additional education.   Clinical Observations/Feedback: Pt was focused on discharging.  Pt was saying she wanted to get a cigarette and eat some fish when she leaves.  Pt managed to identify triggers as no seeing her kids and her mother not picking up the phone.  Pt didn't identify any ways to avoid or deal with triggers head on.    [name], LRT/CTRS        [name] A 06/02/2019 11:51 AM

## 2019-06-02 NOTE — Progress Notes (Signed)
  [facility] Adult Case Management Discharge Plan :  Will you be returning to the same living situation after discharge:  Yes,  home  At discharge, do you have transportation home?: Yes,  ride will be provided  Do you have the ability to pay for your medications: Yes,  medicaid   Release of information consent forms completed and in the chart;  Patient's signature needed at discharge.  Patient to Follow up at: Follow-up [facility] on 06/02/2019.   Why: Your ACTT team will be out to see you today, Friday, February 5th, 2021 around this afternoon. Please give them your discharge summary paperwork.  Contact information: [place] [address] [phone]           Next level of care provider has access to [facility] and Suicide Prevention discussed: Yes,  with pt's mother      Has patient been referred to the Quitline?: Patient refused referral  Patient has been referred for addiction treatment: Yes  Billey Chang, Student-Social Work 06/02/2019, 9:00 AM

## 2019-06-02 NOTE — Progress Notes (Signed)
RN met with pt and reviewed pt's discharge instructions.  Pt verbalized understanding of discharge instructions and pt did not have any questions. RN returned pt's belongings  to pt.   Prescriptions were given to pt.  Pt denies SI/HI/AVH and voiced no concerns.  Pt was appreciative of the care pt received at [facility]. Patient discharged to the lobby and helped pt in safe transport car.

## 2019-06-02 NOTE — BHH Suicide Risk Assessment (Signed)
[facility] Discharge Suicide Risk Assessment   Principal Problem: Schizoaffective disorder, bipolar type [facility]) Discharge Diagnoses: Principal Problem:   Schizoaffective disorder, bipolar type ([place]) Active Problems:   Suicidal ideation   Total Time spent with patient: 45 minutes Musculoskeletal: Strength & Muscle Tone: within normal limits Gait & Station: normal Patient leans: N/A  Psychiatric Specialty Exam: Physical Exam  Review of Systems  Blood pressure 107/61, pulse (!) 108, temperature 98.7 F (37.1 C), temperature source Oral, resp. rate 20, SpO2 100 %.There is no height or weight on file to calculate BMI.  General Appearance: Casual  Eye Contact:  Good  Speech:  Clear and Coherent  Volume:  Increased  Mood:  Euthymic  Affect:  Full Range  Thought Process:  Goal Directed  Orientation:  Full (Time, Place, and Person)  Thought Content:  Tangential  Suicidal Thoughts:  No  Homicidal Thoughts:  No  Memory:  Immediate;   Fair Recent;   Fair  Judgement:  Intact  Insight:  Present  Psychomotor Activity:  Normal  Concentration:  Concentration: Fair and Attention Span: Fair  Recall:  [employer] of Knowledge:  Fair  Language:  Fair  Akathisia:  Negative  Handed:  Right  AIMS (if indicated):     Assets:  Communication Skills Desire for Improvement  ADL's:  Intact  Cognition:  WNL  Sleep:  Number of Hours: 10.25     Mental Status Per Nursing Assessment::   On Admission:  NA  Demographic Factors:  Unemployed  Loss Factors: Decrease in vocational status  Historical Factors: Impulsivity  Risk Reduction Factors:   Sense of responsibility to family and Religious beliefs about death  Continued Clinical Symptoms:  Bipolar Disorder:   Bipolar II  Cognitive Features That Contribute To Risk:  Loss of executive function    Suicide Risk:  Minimal: No identifiable suicidal ideation.  Patients presenting with no risk factors but with morbid ruminations; may be  classified as minimal risk based on the severity of the depressive symptoms  Follow-up [facility].   Contact information: [place] [address] [phone]           Plan Of Care/Follow-up recommendations:  Activity:  full  Jatavis Malek, MD 06/02/2019, 8:13 AM

## 2019-06-07 ENCOUNTER — Encounter (HOSPITAL_COMMUNITY): Payer: Self-pay | Admitting: Emergency Medicine

## 2019-06-07 ENCOUNTER — Emergency Department (HOSPITAL_COMMUNITY)
Admission: EM | Admit: 2019-06-07 | Discharge: 2019-06-08 | Disposition: A | Discharge status: Home / Self Care | Payer: Medicaid Other | Attending: Emergency Medicine | Admitting: Emergency Medicine

## 2019-06-07 ENCOUNTER — Other Ambulatory Visit: Payer: Self-pay

## 2019-06-07 DIAGNOSIS — R45851 Suicidal ideations: Secondary | ICD-10-CM | POA: Insufficient documentation

## 2019-06-07 DIAGNOSIS — E119 Type 2 diabetes mellitus without complications: Secondary | ICD-10-CM | POA: Diagnosis not present

## 2019-06-07 DIAGNOSIS — I10 Essential (primary) hypertension: Secondary | ICD-10-CM | POA: Diagnosis not present

## 2019-06-07 DIAGNOSIS — F319 Bipolar disorder, unspecified: Secondary | ICD-10-CM | POA: Insufficient documentation

## 2019-06-07 DIAGNOSIS — F329 Major depressive disorder, single episode, unspecified: Secondary | ICD-10-CM | POA: Diagnosis present

## 2019-06-07 DIAGNOSIS — Z794 Long term (current) use of insulin: Secondary | ICD-10-CM | POA: Insufficient documentation

## 2019-06-07 DIAGNOSIS — F17210 Nicotine dependence, cigarettes, uncomplicated: Secondary | ICD-10-CM | POA: Insufficient documentation

## 2019-06-07 LAB — ACETAMINOPHEN LEVEL: Acetaminophen (Tylenol), Serum: <10 ug/mL — ABNORMAL LOW (ref 10–30)

## 2019-06-07 LAB — RAPID URINE DRUG SCREEN, HOSP PERFORMED
Amphetamines: NOT DETECTED
Barbiturates: NOT DETECTED
Benzodiazepines: NOT DETECTED
Cocaine: NOT DETECTED
Opiates: NOT DETECTED
Tetrahydrocannabinol: NOT DETECTED

## 2019-06-07 LAB — COMPREHENSIVE METABOLIC PANEL
ALT: 22 U/L (ref 0–44)
AST: 26 U/L (ref 15–41)
Albumin: 3.8 g/dL (ref 3.5–5.0)
Alkaline Phosphatase: 37 U/L — ABNORMAL LOW (ref 38–126)
Anion gap: 9 (ref 5–15)
BUN: 9 mg/dL (ref 6–20)
CO2: 24 mmol/L (ref 22–32)
Calcium: 9.2 mg/dL (ref 8.9–10.3)
Chloride: 107 mmol/L (ref 98–111)
Creatinine, Ser: 0.87 mg/dL (ref 0.44–1.00)
GFR calc Af Amer: >60 mL/min (ref >60)
GFR calc non Af Amer: >60 mL/min (ref >60)
Glucose, Bld: 131 mg/dL — ABNORMAL HIGH (ref 70–99)
Potassium: 3.3 mmol/L — ABNORMAL LOW (ref 3.5–5.1)
Sodium: 140 mmol/L (ref 135–145)
Total Bilirubin: 0.6 mg/dL (ref 0.3–1.2)
Total Protein: 6.9 g/dL (ref 6.5–8.1)

## 2019-06-07 LAB — CBC
HCT: 37 % (ref 36.0–46.0)
Hemoglobin: 11.5 g/dL — ABNORMAL LOW (ref 12.0–15.0)
MCH: 25.8 pg — ABNORMAL LOW (ref 26.0–34.0)
MCHC: 31.1 g/dL (ref 30.0–36.0)
MCV: 83 fL (ref 80.0–100.0)
Platelets: 259 10*3/uL (ref 150–400)
RBC: 4.46 MIL/uL (ref 3.87–5.11)
RDW: 14.9 % (ref 11.5–15.5)
WBC: 13.3 10*3/uL — ABNORMAL HIGH (ref 4.0–10.5)
nRBC: 0 % (ref 0.0–0.2)

## 2019-06-07 LAB — LITHIUM LEVEL: Lithium Lvl: 0.29 mmol/L — ABNORMAL LOW (ref 0.60–1.20)

## 2019-06-07 LAB — ETHANOL: Alcohol, Ethyl (B): <10 mg/dL (ref <10)

## 2019-06-07 LAB — SALICYLATE LEVEL: Salicylate Lvl: <7 mg/dL — ABNORMAL LOW (ref 7.0–30.0)

## 2019-06-07 LAB — I-STAT BETA HCG BLOOD, ED (MC, WL, AP ONLY): I-stat hCG, quantitative: <5 m[IU]/mL (ref <5)

## 2019-06-07 MED ORDER — INSULIN DETEMIR 100 UNIT/ML ~~LOC~~ SOLN
40.0000 [IU] | Freq: Two times a day (BID) | SUBCUTANEOUS | Status: DC
  Administered 2019-06-08: 40 [IU] via SUBCUTANEOUS
  Filled 2019-06-07 (×4): qty 0.4

## 2019-06-07 MED ORDER — LITHIUM CARBONATE ER 450 MG PO TBCR: 900.0000 mg | EXTENDED_RELEASE_TABLET | Freq: Every day | ORAL | Status: DC

## 2019-06-07 MED ORDER — FLUCONAZOLE 100 MG PO TABS: 100.0000 mg | ORAL_TABLET | Freq: Every day | ORAL | Status: DC

## 2019-06-07 MED ORDER — PROPRANOLOL HCL 10 MG PO TABS
10.0000 mg | ORAL_TABLET | Freq: Two times a day (BID) | ORAL | Status: DC
  Filled 2019-06-07 (×2): qty 1

## 2019-06-07 NOTE — ED Notes (Signed)
Pt belonging inventoried and placed in locker number 6

## 2019-06-07 NOTE — ED Notes (Addendum)
Belongings in locker #4.  Not inventoried yet.
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
## 2019-06-07 NOTE — ED Triage Notes (Signed)
Patient reports feeling depressed with suicidal thoughts plans to overdose on medications with auditory hallucinations .

## 2019-06-07 NOTE — ED Notes (Signed)
Pt in purple scrubs. Pt comfortable. Pt resting.

## 2019-06-08 NOTE — ED Notes (Signed)
TTS in process 

## 2019-06-08 NOTE — ED Notes (Signed)
Pt's valuables given to security.  
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
## 2019-06-08 NOTE — ED Notes (Signed)
Patient Alert and oriented to baseline. Stable and ambulatory to baseline. Patient verbalized understanding of the discharge instructions.  Patient belongings were taken by the patient.   

## 2019-06-08 NOTE — ED Notes (Signed)
Pt out of her room repeatedly, demanding help finding a new place to stay. Pt refusing to wear a mask. Asked pt multiple times to go back in her room. Pt started screaming and yelling refusing to go back in her room. Security and [employer] to the bedside. Situation deescalated. Pt now back in her room. Given phone to call crisis number for ACTT team.

## 2019-06-08 NOTE — ED Notes (Signed)
Re-eval in a.m.  Breakfast ordered
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
## 2019-06-08 NOTE — Discharge Instructions (Addendum)
Your ACTT team was contacted today to request their assistance with finding alternative housing for you. The Housing Specialist will reach out to you for assistance as soon as they are able. The contact number for [facility] is [phone].

## 2019-06-08 NOTE — Progress Notes (Signed)
Patient ID: [name], female   DOB: [birth year], 29 y.o.   MRN: [id number]   Reassessment   HPI: [name] is an 29 y.o. female presenting voluntarily to [facility] with SI with plan to overdose. Patient reported onset of SI with plan 2 days ago. Patient was recently discharged from [facility] on 06/02/2019. Patient reported trigger/stressor of living in [employer]. Patient reported living in Transitional Housing for the past 1 month. Patient reported the residents there are mean to her and call her names "they call me slow, crazy and bald-headed". Patient reported, seeing and hearing people laugh at her. Patient reported, hearing the voices and seeing people laugh at her made her feel suicidal. Patient reported 5 past suicide attempts. Patient has prior inpatient psych admission. Patient reported being seen by [facility]. Patient reported medications are not working.   Patient reported that her ACT Team arranged her Transitional Housing, with the goal of independent living (pt's own place) in two months. Pt denies, HI and self-injurious behaviors and access to weapons.   Psychiatry reassessment: This is a 29 year old female who presented to [facility] ED for concerns as noted above. During this evaluation, she denies SI, HI or psychosis. She does not appear to be responding to internal or external stimuli.  She reports that she does not like the transitional center she is living in and she prefer to go to a shelter or another center. She has an ACT team and reports being complaint with medication.   Disposition: Patient denies current SI, HI or psychosis She currently has an ACT team who is assisting with her psychiatric needs. There is no evidence of imminent risk to self or others at present. patient does not meet criteria for psychiatric inpatient admission. Reccommended to continue follow-up services with ACT team. Patient biggest stressor is reported as her not wanting  to live in current living environment. She reports that she is willing to go to a shelter. I will ask social work to speak with patient in regard to placement.      ED updated on disposition.

## 2019-06-08 NOTE — ED Notes (Signed)
tts at bedside 

## 2019-06-08 NOTE — ED Notes (Signed)
Ordered diet tray for pt  

## 2019-06-11 ENCOUNTER — Other Ambulatory Visit: Payer: Self-pay

## 2019-06-11 ENCOUNTER — Observation Stay (HOSPITAL_COMMUNITY)
Admission: AD | Admit: 2019-06-11 | Discharge: 2019-06-12 | Disposition: A | Discharge status: Home / Self Care | Payer: Medicaid Other | Attending: Psychiatry | Admitting: Psychiatry

## 2019-06-11 ENCOUNTER — Encounter (HOSPITAL_COMMUNITY): Payer: Self-pay | Admitting: Registered Nurse

## 2019-06-11 DIAGNOSIS — E669 Obesity, unspecified: Secondary | ICD-10-CM | POA: Insufficient documentation

## 2019-06-11 DIAGNOSIS — Z20822 Contact with and (suspected) exposure to covid-19: Secondary | ICD-10-CM | POA: Diagnosis not present

## 2019-06-11 DIAGNOSIS — Z79899 Other long term (current) drug therapy: Secondary | ICD-10-CM | POA: Diagnosis not present

## 2019-06-11 DIAGNOSIS — F17210 Nicotine dependence, cigarettes, uncomplicated: Secondary | ICD-10-CM | POA: Diagnosis not present

## 2019-06-11 DIAGNOSIS — Z6839 Body mass index (BMI) 39.0-39.9, adult: Secondary | ICD-10-CM | POA: Diagnosis not present

## 2019-06-11 DIAGNOSIS — I10 Essential (primary) hypertension: Secondary | ICD-10-CM | POA: Diagnosis not present

## 2019-06-11 DIAGNOSIS — F319 Bipolar disorder, unspecified: Principal | ICD-10-CM

## 2019-06-11 DIAGNOSIS — Z794 Long term (current) use of insulin: Secondary | ICD-10-CM | POA: Insufficient documentation

## 2019-06-11 DIAGNOSIS — E119 Type 2 diabetes mellitus without complications: Secondary | ICD-10-CM | POA: Diagnosis not present

## 2019-06-11 DIAGNOSIS — F332 Major depressive disorder, recurrent severe without psychotic features: Secondary | ICD-10-CM | POA: Diagnosis present

## 2019-06-11 LAB — URINALYSIS, ROUTINE W REFLEX MICROSCOPIC
Bilirubin Urine: NEGATIVE
Glucose, UA: NEGATIVE mg/dL
Hgb urine dipstick: NEGATIVE
Ketones, ur: 5 mg/dL — AB
Leukocytes,Ua: NEGATIVE
Nitrite: NEGATIVE
Protein, ur: 30 mg/dL — AB
Specific Gravity, Urine: 1.027 (ref 1.005–1.030)
pH: 5 (ref 5.0–8.0)

## 2019-06-11 LAB — ETHANOL: Alcohol, Ethyl (B): <10 mg/dL (ref <10)

## 2019-06-11 LAB — COMPREHENSIVE METABOLIC PANEL
ALT: 22 U/L (ref 0–44)
AST: 29 U/L (ref 15–41)
Albumin: 4.2 g/dL (ref 3.5–5.0)
Alkaline Phosphatase: 42 U/L (ref 38–126)
Anion gap: 9 (ref 5–15)
BUN: 9 mg/dL (ref 6–20)
CO2: 24 mmol/L (ref 22–32)
Calcium: 9.2 mg/dL (ref 8.9–10.3)
Chloride: 107 mmol/L (ref 98–111)
Creatinine, Ser: 0.72 mg/dL (ref 0.44–1.00)
GFR calc Af Amer: >60 mL/min (ref >60)
GFR calc non Af Amer: >60 mL/min (ref >60)
Glucose, Bld: 137 mg/dL — ABNORMAL HIGH (ref 70–99)
Potassium: 3.4 mmol/L — ABNORMAL LOW (ref 3.5–5.1)
Sodium: 140 mmol/L (ref 135–145)
Total Bilirubin: 0.5 mg/dL (ref 0.3–1.2)
Total Protein: 7.6 g/dL (ref 6.5–8.1)

## 2019-06-11 LAB — RAPID URINE DRUG SCREEN, HOSP PERFORMED
Amphetamines: NOT DETECTED
Barbiturates: NOT DETECTED
Benzodiazepines: NOT DETECTED
Cocaine: NOT DETECTED
Opiates: NOT DETECTED
Tetrahydrocannabinol: NOT DETECTED

## 2019-06-11 LAB — RESPIRATORY PANEL BY RT PCR (FLU A&B, COVID)
Influenza A by PCR: NEGATIVE
Influenza B by PCR: NEGATIVE
SARS Coronavirus 2 by RT PCR: NEGATIVE

## 2019-06-11 LAB — GLUCOSE, CAPILLARY
Glucose-Capillary: 57 mg/dL — ABNORMAL LOW (ref 70–99)
Glucose-Capillary: 165 mg/dL — ABNORMAL HIGH (ref 70–99)
Glucose-Capillary: 132 mg/dL — ABNORMAL HIGH (ref 70–99)

## 2019-06-11 LAB — CBC
HCT: 41.4 % (ref 36.0–46.0)
Hemoglobin: 12.8 g/dL (ref 12.0–15.0)
MCH: 26.1 pg (ref 26.0–34.0)
MCHC: 30.9 g/dL (ref 30.0–36.0)
MCV: 84.5 fL (ref 80.0–100.0)
Platelets: 309 10*3/uL (ref 150–400)
RBC: 4.9 MIL/uL (ref 3.87–5.11)
RDW: 15 % (ref 11.5–15.5)
WBC: 16 10*3/uL — ABNORMAL HIGH (ref 4.0–10.5)
nRBC: 0 % (ref 0.0–0.2)

## 2019-06-11 LAB — PREGNANCY, URINE: Preg Test, Ur: NEGATIVE

## 2019-06-11 MED ORDER — INSULIN DETEMIR 100 UNIT/ML ~~LOC~~ SOLN
40.0000 [IU] | Freq: Two times a day (BID) | SUBCUTANEOUS | Status: DC
  Administered 2019-06-11 – 2019-06-12 (×2): 40 [IU] via SUBCUTANEOUS

## 2019-06-11 MED ORDER — PROPRANOLOL HCL 10 MG PO TABS
10.0000 mg | ORAL_TABLET | Freq: Two times a day (BID) | ORAL | Status: DC
  Administered 2019-06-11: 10 mg via ORAL
  Filled 2019-06-11: qty 1

## 2019-06-11 MED ORDER — DIPHENHYDRAMINE HCL 25 MG PO CAPS
25.0000 mg | ORAL_CAPSULE | Freq: Every evening | ORAL | Status: DC | PRN
  Filled 2019-06-11: qty 1

## 2019-06-11 MED ORDER — LITHIUM CARBONATE ER 450 MG PO TBCR
900.0000 mg | EXTENDED_RELEASE_TABLET | Freq: Every day | ORAL | Status: DC
  Filled 2019-06-11: qty 2

## 2019-06-11 MED ORDER — DIPHENHYDRAMINE HCL (SLEEP) 25 MG PO TABS: 25.0000 mg | ORAL_TABLET | Freq: Every evening | ORAL | Status: DC | PRN

## 2019-06-11 NOTE — Tx Team (Signed)
Initial Treatment Plan 06/11/2019 3:54 PM [name] [id number]    PATIENT STRESSORS: Financial difficulties Medication change or noncompliance   PATIENT STRENGTHS: Ability for insight Active sense of humor Capable of independent living Communication skills Supportive family/friends   PATIENT IDENTIFIED PROBLEMS: "medications not working"  Suicidal ideations  anxiety  Hyperactivity  Housing issues             DISCHARGE CRITERIA:  Ability to meet basic life and health needs Adequate post-discharge living arrangements Motivation to continue treatment in a less acute level of care  PRELIMINARY DISCHARGE PLAN: Attend aftercare/continuing care group Placement in alternative living arrangements  PATIENT/FAMILY INVOLVEMENT: This treatment plan has been presented to and reviewed with the patient, [name].  The patient and family have been given the opportunity to ask questions and make suggestions.  Baron Sane, RN 06/11/2019, 3:54 PM

## 2019-06-11 NOTE — H&P (Addendum)
[facility] Provider Admission PAA/H&P  Patient Identification: [name] MRN:  [id number] Date of Evaluation:  06/11/2019 Chief Complaint:  MDD (major depressive disorder), recurrent episode, severe ([place]) [F33.2] Principal Diagnosis: Bipolar I disorder ([place]) Diagnosis:  Principal Problem:   Bipolar I disorder ([place])  History of Present Illness: Per TTS Assessment Note: Reviewed by this provider: Claudie [name] is an 29 y.o. female present to [facility] as a walk-in complaining to hearing voices. Patient report the voices are loud and keeps yelling her name. Patient stated, "my ACTT Team I not taking it seriously about my changing my housing." Patient lives in transitional housing and reports she unable to sleep due to all the noise. Patient is unable to determine if the voices are in her head or real life. While on the observation unit waiting to be assessed patient complained of hearing people down the hall talking and laughing. Patient report passive suicidal ideations stating, "I am ready to die cause of my life." Patient denied having a plan. Patient denied visual hallucinations. Report paranoia of feeling that someone is after her.    Patient recently discharged from [facility] 06/05/2019. Report she has been taking her medication as prescribed but feels it does not work. Patient was laying down in the bed when TTS assessed the patient. Patient complained that the left side of her head hurt. Patient oriented x3 and judgement partial due to auditory hallucinations. Patient minimally anxiously triggered by paranoia. Patient is responding to internal stimuli.  Associated Signs/Symptoms: Depression Symptoms:  depressed mood, recurrent thoughts of death, (Hypo) Manic Symptoms:  Hallucinations, Impulsivity, Irritable Mood, Anxiety Symptoms:  Excessive Worry, Psychotic Symptoms:  Hallucinations: Auditory PTSD Symptoms: NA Total Time spent with patient: 30 minutes  Past Psychiatric History:  Bipolar disorder  Is the patient at risk to self? Yes.    Has the patient been a risk to self in the past 6 months? Yes.    Has the patient been a risk to self within the distant past? Yes.    Is the patient a risk to others? No.  Has the patient been a risk to others in the past 6 months? No.  Has the patient been a risk to others within the distant past? No.   Prior Inpatient Therapy: Prior Inpatient Therapy: Yes Prior Therapy Dates: Per chart, "2021, 2020, 2019 and mult other admissions."  Prior Therapy Facilty/Provider(s): Per chart, "[facility], [place]."  Reason for Treatment: Per chart, "Bipolar."  Prior Outpatient Therapy: Prior Outpatient Therapy: Yes Prior Therapy Dates: Current. Prior Therapy Facilty/Provider(s): [facility]. Reason for Treatment: Medication management.  Does patient have an ACCT team?: Yes Does patient have Intensive In-House Services?  : No Does patient have [facility] services? : No Does patient have P4CC services?: No  Alcohol Screening: 1. How often do you have a drink containing alcohol?: 2 to 3 times a week 2. How many drinks containing alcohol do you have on a typical day when you are drinking?: 1 or 2 3. How often do you have six or more drinks on one occasion?: Never AUDIT-C Score: 3 4. How often during the last year have you found that you were not able to stop drinking once you had started?: Never 5. How often during the last year have you failed to do what was normally expected from you becasue of drinking?: Never 6. How often during the last year have you needed a first drink in the morning to get yourself going after  a heavy drinking session?: Never 7. How often during the last year have you had a feeling of guilt of remorse after drinking?: Never 8. How often during the last year have you been unable to remember what happened the night before because you had been drinking?: Never 9. Have you or someone else been  injured as a result of your drinking?: No 10. Has a relative or friend or a doctor or another health worker been concerned about your drinking or suggested you cut down?: No Alcohol Use Disorder Identification Test Final Score (AUDIT): 3 Substance Abuse History in the last 12 months:  Yes.   Consequences of Substance Abuse: Family Consequences:  Family discord Previous Psychotropic Medications: Yes  Psychological Evaluations: Yes  Past Medical History:  Past Medical History:  Diagnosis Date  . Anxiety   . Bipolar 1 disorder ([place])   . Cognitive deficits   . Depression   . Diabetes mellitus without complication ([place])   . Hypertension   . Mental disorder   . Mental health disorder   . Obesity     Past Surgical History:  Procedure Laterality Date  . CESAREAN SECTION    . CESAREAN SECTION N/A 04/25/2013   Procedure: REPEAT CESAREAN SECTION;  Surgeon: [name], MD;  Location: [facility];  Service: Obstetrics;  Laterality: N/A;  . MASS EXCISION N/A 06/03/2012   Procedure: EXCISION MASS;  Surgeon: [name], MD;  Location: [place];  Service: ENT;  Laterality: N/A;  Excision uvula mass  . TONSILLECTOMY N/A 06/03/2012   Procedure: TONSILLECTOMY;  Surgeon: [name], MD;  Location: [place];  Service: ENT;  Laterality: N/A;  . TONSILLECTOMY     Family History:  Family History  Problem Relation Age of Onset  . Hypertension Mother   . Diabetes Father    Family Psychiatric History: Unaware Tobacco Screening:   Social History:  Social History   Substance and Sexual Activity  Alcohol Use Yes   Comment: occ     Social History   Substance and Sexual Activity  Drug Use Never   Comment: Patient denies    Additional Social History: Marital status: Single    Pain Medications: see MAR Prescriptions: see MAR Over the Counter: see MAR History of alcohol / drug use?: No history of alcohol / drug abuse                     Allergies:   Allergies  Allergen Reactions  . Wellbutrin [Bupropion] Shortness Of Breath  . Omnipaque [Iohexol] Swelling and Other (See Comments)    Reaction:  Eye swelling  . Penicillins Hives and Other (See Comments)    Has patient had a PCN reaction causing immediate rash, facial/tongue/throat swelling, SOB or lightheadedness with hypotension: Unknown Has patient had a PCN reaction causing severe rash involving mucus membranes or skin necrosis: Yes Has patient had a PCN reaction that required hospitalization Unknown Has patient had a PCN reaction occurring within the last 10 years: No If all of the above answers are "NO", then may proceed with Cephalosporin use.  Herma Mering [Benztropine]     Make pt feel crazy  . Depakote Er [Divalproex Sodium Er] Nausea And Vomiting  . Divalproex Sodium   . Iohexol    Lab Results:  Results for orders placed or performed during the hospital encounter of 06/11/19 (from the past 48 hour(s))  Respiratory Panel by RT PCR (Flu A&B, Covid) - Nasopharyngeal Swab  Status: None   Collection Time: 06/11/19 11:29 AM   Specimen: Nasopharyngeal Swab  Result Value Ref Range   SARS Coronavirus 2 by RT PCR NEGATIVE NEGATIVE    Comment: (NOTE) SARS-CoV-2 target nucleic acids are NOT DETECTED. The SARS-CoV-2 RNA is generally detectable in upper respiratoy specimens during the acute phase of infection. The lowest concentration of SARS-CoV-2 viral copies this assay can detect is 131 copies/mL. A negative result does not preclude SARS-Cov-2 infection and should not be used as the sole basis for treatment or other patient management decisions. A negative result may occur with  improper specimen collection/handling, submission of specimen other than nasopharyngeal swab, presence of viral mutation(s) within the areas targeted by this assay, and inadequate number of viral copies (<131 copies/mL). A negative result must be combined with clinical observations,  patient history, and epidemiological information. The expected result is Negative. Fact Sheet for Patients:  PinkCheek.be Fact Sheet for Healthcare Providers:  GravelBags.it This test is not yet ap proved or cleared by the [place] FDA and  has been authorized for detection and/or diagnosis of SARS-CoV-2 by FDA under an Emergency Use Authorization (EUA). This EUA will remain  in effect (meaning this test can be used) for the duration of the COVID-19 declaration under Section 564(b)(1) of the Act, 21 U.S.C. section 360bbb-3(b)(1), unless the authorization is terminated or revoked sooner.    Influenza A by PCR NEGATIVE NEGATIVE   Influenza B by PCR NEGATIVE NEGATIVE    Comment: (NOTE) The Xpert Xpress SARS-CoV-2/FLU/RSV assay is intended as an aid in  the diagnosis of influenza from Nasopharyngeal swab specimens and  should not be used as a sole basis for treatment. Nasal washings and  aspirates are unacceptable for Xpert Xpress SARS-CoV-2/FLU/RSV  testing. Fact Sheet for Patients: PinkCheek.be Fact Sheet for Healthcare Providers: GravelBags.it This test is not yet approved or cleared by the [place] FDA and  has been authorized for detection and/or diagnosis of SARS-CoV-2 by  FDA under an Emergency Use Authorization (EUA). This EUA will remain  in effect (meaning this test can be used) for the duration of the  Covid-19 declaration under Section 564(b)(1) of the Act, 21  U.S.C. section 360bbb-3(b)(1), unless the authorization is  terminated or revoked. Performed at [facility], [place] [address], [place], [place] [address]   Glucose, capillary     Status: Abnormal   Collection Time: 06/11/19 12:09 PM  Result Value Ref Range   Glucose-Capillary 57 (L) 70 - 99 mg/dL  Glucose, capillary     Status: Abnormal   Collection Time: 06/11/19  3:04  PM  Result Value Ref Range   Glucose-Capillary 165 (H) 70 - 99 mg/dL    Blood Alcohol level:  Lab Results  Component Value Date   ETH <10 06/07/2019   ETH <10 99991111    Metabolic Disorder Labs:  Lab Results  Component Value Date   HGBA1C 5.2 05/19/2019   MPG 102.54 05/19/2019   MPG 108.28 10/11/2018   Lab Results  Component Value Date   PROLACTIN 5.3 05/19/2019   PROLACTIN 5.2 10/11/2018   Lab Results  Component Value Date   CHOL 172 05/19/2019   TRIG 75 05/19/2019   HDL 45 05/19/2019   CHOLHDL 3.8 05/19/2019   VLDL 15 05/19/2019   LDLCALC 112 (H) 05/19/2019   LDLCALC NOT CALCULATED 10/11/2018    Current Medications: Current Facility-Administered Medications  Medication Dose Route Frequency Provider Last Rate Last Admin  . diphenhydrAMINE (BENADRYL) capsule 25  mg  25 mg Oral QHS PRN Hampton Abbot, MD      . insulin detemir (LEVEMIR) injection 40 Units  40 Units Subcutaneous BID Dervin Vore B, NP      . lithium carbonate (ESKALITH) CR tablet 900 mg  900 mg Oral QHS Elisia Stepp B, NP      . propranolol (INDERAL) tablet 10 mg  10 mg Oral BID Diane Hanel B, NP       PTA Medications: Medications Prior to Admission  Medication Sig Dispense Refill Last Dose  . [START ON 06/28/2019] ARIPiprazole ER (ABILIFY MAINTENA) 400 MG SRER injection Inject 2 mLs (400 mg total) into the muscle every 28 (twenty-eight) days. 1 each 11   . diphenhydrAMINE (SOMINEX) 25 MG tablet Take 25 mg by mouth at bedtime as needed for sleep.     . insulin detemir (LEVEMIR) 100 UNIT/ML injection Inject 40 Units into the skin 2 (two) times daily.      Marland Kitchen lithium carbonate (ESKALITH) 450 MG CR tablet Take 2 tablets (900 mg total) by mouth at bedtime. 60 tablet 2   . perphenazine (TRILAFON) 8 MG tablet Take 1 tablet (8 mg total) by mouth 2 (two) times daily. (Patient not taking: Reported on 06/07/2019) 60 tablet 2   . propranolol (INDERAL) 10 MG tablet Take 1 tablet (10 mg total) by mouth 2  (two) times daily. 60 tablet 2     Musculoskeletal: Strength & Muscle Tone: within normal limits Gait & Station: normal Patient leans: N/A  Psychiatric Specialty Exam: Physical Exam  Vitals reviewed. Constitutional: She is oriented to person, place, and time. She appears well-developed.  Respiratory: Effort normal.  Musculoskeletal:        General: Normal range of motion.  Neurological: She is alert and oriented to person, place, and time.  Psychiatric: Her mood appears anxious. She is actively hallucinating. She expresses impulsivity. She exhibits a depressed mood. She expresses suicidal ideation.    Review of Systems  Psychiatric/Behavioral: Positive for hallucinations and suicidal ideas.  All other systems reviewed and are negative.   Blood pressure (!) 142/65, pulse 93, temperature 98.1 F (36.7 C), temperature source Oral, resp. rate 20, height 5\' 7"  (1.702 m), weight 115 kg, SpO2 100 %.Body mass index is 39.71 kg/m.  General Appearance: Casual  Eye Contact:  Good  Speech:  Clear and Coherent and Normal Rate  Volume:  Normal  Mood:  Anxious and Depressed  Affect:  Congruent  Thought Process:  Coherent and Goal Directed  Orientation:  Full (Time, Place, and Person)  Thought Content:  Hallucinations: Auditory  Suicidal Thoughts:  Yes.  without intent/plan  Homicidal Thoughts:  No  Memory:  Immediate;   Good Recent;   Good  Judgement:  Fair  Insight:  Lacking  Psychomotor Activity:  Normal  Concentration:  Concentration: Good and Attention Span: Good  Recall:  Good  Fund of Knowledge:  Good  Language:  Good  Akathisia:  No  Handed:  Right  AIMS (if indicated):     Assets:  Communication Skills Desire for Improvement Housing Social Support  ADL's:  Intact  Cognition:  WNL  Sleep:         Treatment Plan Summary: Medication management and Plan Restart home medication and overnight observation  Observation Level/Precautions:  15 minute checks Laboratory:   CBC Chemistry Profile UDS UA ETOH, Covid-19 Psychotherapy:  Individual Medications:  Restart home medications Consultations:  As needed Discharge Concerns:  Safety, stabilization, and access to medication Estimated LOS:  Over night observation, reassess in morning Other:      Annaleigh Steinmeyer, NP 2/14/20213:59 PM

## 2019-06-11 NOTE — Progress Notes (Signed)
Patient ID: [name], female   DOB: Jul 25, 1990, 29 y.o.   MRN: [id number]  D: Pt alert and oriented during [facility] admission process. Pt denies SI/HI, A/VH, and any pain. Pt is cooperative on the unit.  "History of Present Illness: Per TTS Assessment Note: Reviewed by this provider: Maris [name] an 29 [facility] complaining to hearing voices. Patient report the voices are loud and keeps yelling her name. Patient stated, "my ACTT Team I not taking it seriously about my changing my housing." Patient lives in transitional housing and reports she unable to sleep due to all the noise. Patient is unable to determine if the voices are in her head or real life. While on the observation unit waiting to be assessed patient complained of hearing people down the hall talking and laughing. Patient report passive suicidal ideations stating, "I am ready to die cause of my life." Patient denied having a plan. Patient denied visual hallucinations. Report paranoia of feeling that someone is after her."  A: Education, support, reassurance, and encouragement provided, q15 minute safety checks initiated. Pt's belongings in locker #33.    R: Pt denies any concerns at this time, and verbally contracts for safety. Pt ambulating on the unit with no issues. Pt remains safe the unit.
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
## 2019-06-11 NOTE — Progress Notes (Signed)
   06/11/19 2013  Psych Admission Type (Psych Patients Only)  Admission Status Voluntary  Psychosocial Assessment  Patient Complaints Sadness  Eye Contact Fair  Facial Expression Animated  Affect Preoccupied  Speech Tangential;Loud  Interaction Assertive  Motor Activity Other (Comment) (WNL)  Appearance/Hygiene Unremarkable  Behavior Characteristics Cooperative  Mood Pleasant  Aggressive Behavior  Effect No apparent injury  Thought Process  Coherency Tangential;Flight of ideas  Content Preoccupation  Delusions None reported or observed  Perception Derealization  Hallucination Auditory  Judgment Poor  Confusion Moderate  Danger to Self  Current suicidal ideation? Denies  Danger to Others  Danger to Others None reported or observed

## 2019-06-12 DIAGNOSIS — F319 Bipolar disorder, unspecified: Secondary | ICD-10-CM | POA: Diagnosis not present

## 2019-06-12 LAB — GLUCOSE, CAPILLARY: Glucose-Capillary: 162 mg/dL — ABNORMAL HIGH (ref 70–99)

## 2019-06-12 MED ORDER — PERPHENAZINE 4 MG PO TABS: ORAL_TABLET | ORAL | 3 refills | Status: DC

## 2019-06-12 MED ORDER — PERPHENAZINE 2 MG PO TABS
8.0000 mg | ORAL_TABLET | Freq: Two times a day (BID) | ORAL | Status: DC
  Administered 2019-06-12: 8 mg via ORAL
  Filled 2019-06-12: qty 4

## 2019-06-12 MED ORDER — AMANTADINE HCL 100 MG PO CAPS: 100.0000 mg | ORAL_CAPSULE | Freq: Two times a day (BID) | ORAL | 1 refills | Status: DC

## 2019-06-12 NOTE — Plan of Care (Signed)
Discharge note  Patient verbalizes readiness for discharge. Follow up plan explained, AVS given. Prescriptions and teaching provided. Belongings returned and signed for. Patient verbalizes understanding. Patient denies SI/HI and assures this Probation officer they will seek assistance should that change. Patient discharged to lobby to wait for lyft.  Problem: Education: Goal: Ability to state activities that reduce stress will improve Outcome: Adequate for Discharge   Problem: Coping: Goal: Ability to identify and develop effective coping behavior will improve Outcome: Adequate for Discharge   Problem: Self-Concept: Goal: Ability to identify factors that promote anxiety will improve Outcome: Adequate for Discharge Goal: Level of anxiety will decrease Outcome: Adequate for Discharge Goal: Ability to modify response to factors that promote anxiety will improve Outcome: Adequate for Discharge   Problem: Education: Goal: Utilization of techniques to improve thought processes will improve Outcome: Adequate for Discharge Goal: Knowledge of the prescribed therapeutic regimen will improve Outcome: Adequate for Discharge   Problem: Activity: Goal: Interest or engagement in leisure activities will improve Outcome: Adequate for Discharge Goal: Imbalance in normal sleep/wake cycle will improve Outcome: Adequate for Discharge   Problem: Coping: Goal: Coping ability will improve Outcome: Adequate for Discharge Goal: Will verbalize feelings Outcome: Adequate for Discharge   Problem: Health Behavior/Discharge Planning: Goal: Ability to make decisions will improve Outcome: Adequate for Discharge Goal: Compliance with therapeutic regimen will improve Outcome: Adequate for Discharge   Problem: Role Relationship: Goal: Will demonstrate positive changes in social behaviors and relationships Outcome: Adequate for Discharge   Problem: Safety: Goal: Ability to disclose and discuss suicidal ideas will  improve Outcome: Adequate for Discharge Goal: Ability to identify and utilize support systems that promote safety will improve Outcome: Adequate for Discharge   Problem: Self-Concept: Goal: Will verbalize positive feelings about self Outcome: Adequate for Discharge Goal: Level of anxiety will decrease Outcome: Adequate for Discharge   Problem: Education: Goal: Knowledge of [facility] Education information/materials will improve Outcome: Adequate for Discharge Goal: Emotional status will improve Outcome: Adequate for Discharge Goal: Mental status will improve Outcome: Adequate for Discharge Goal: Verbalization of understanding the information provided will improve Outcome: Adequate for Discharge   Problem: Activity: Goal: Interest or engagement in activities will improve Outcome: Adequate for Discharge Goal: Sleeping patterns will improve Outcome: Adequate for Discharge   Problem: Coping: Goal: Ability to verbalize frustrations and anger appropriately will improve Outcome: Adequate for Discharge Goal: Ability to demonstrate self-control will improve Outcome: Adequate for Discharge   Problem: Health Behavior/Discharge Planning: Goal: Identification of resources available to assist in meeting health care needs will improve Outcome: Adequate for Discharge Goal: Compliance with treatment plan for underlying cause of condition will improve Outcome: Adequate for Discharge   Problem: Physical Regulation: Goal: Ability to maintain clinical measurements within normal limits will improve Outcome: Adequate for Discharge   Problem: Safety: Goal: Periods of time without injury will increase Outcome: Adequate for Discharge   Problem: Activity: Goal: Will identify at least one activity in which they can participate Outcome: Adequate for Discharge   Problem: Coping: Goal: Ability to identify and develop effective coping behavior will improve Outcome: Adequate for  Discharge Goal: Ability to interact with others will improve Outcome: Adequate for Discharge Goal: Demonstration of participation in decision-making regarding own care will improve Outcome: Adequate for Discharge Goal: Ability to use eye contact when communicating with others will improve Outcome: Adequate for Discharge   Problem: Health Behavior/Discharge Planning: Goal: Identification of resources available to assist in meeting health care needs will improve Outcome:  Adequate for Discharge   Problem: Self-Concept: Goal: Will verbalize positive feelings about self Outcome: Adequate for Discharge   Problem: Activity: Goal: Will verbalize the importance of balancing activity with adequate rest periods Outcome: Adequate for Discharge   Problem: Education: Goal: Will be free of psychotic symptoms Outcome: Adequate for Discharge Goal: Knowledge of the prescribed therapeutic regimen will improve Outcome: Adequate for Discharge   Problem: Coping: Goal: Coping ability will improve Outcome: Adequate for Discharge Goal: Will verbalize feelings Outcome: Adequate for Discharge   Problem: Health Behavior/Discharge Planning: Goal: Compliance with prescribed medication regimen will improve Outcome: Adequate for Discharge   Problem: Nutritional: Goal: Ability to achieve adequate nutritional intake will improve Outcome: Adequate for Discharge   Problem: Role Relationship: Goal: Ability to communicate needs accurately will improve Outcome: Adequate for Discharge Goal: Ability to interact with others will improve Outcome: Adequate for Discharge   Problem: Safety: Goal: Ability to redirect hostility and anger into socially appropriate behaviors will improve Outcome: Adequate for Discharge Goal: Ability to remain free from injury will improve Outcome: Adequate for Discharge   Problem: Self-Care: Goal: Ability to participate in self-care as condition permits will improve Outcome:  Adequate for Discharge   Problem: Self-Concept: Goal: Will verbalize positive feelings about self Outcome: Adequate for Discharge

## 2019-06-12 NOTE — BH Assessment (Signed)
[place] Assessment Progress Note  Per Myles Lipps, MD, this voluntary pt does not require psychiatric hospitalization at this time.  Pt is to be discharged from [facility] with recommendation to continue treatment with the [facility] ACT [facility].  This has been included in pt's discharge instructions.  At 10:01 this Probation officer called the [facility] ACT Team and spoke to [place], notifying her of pt's disposition.  Pt's nurse, Legrand Como, has been notified.  Jalene Mullet, [place] Triage Specialist [phone]

## 2019-06-12 NOTE — Discharge Summary (Signed)
Physician Discharge Summary Note  Patient:  [name] is an 29 y.o., female MRN:  [id number] DOB:  January 20, 1991 Patient phone:  [phone] (home)  Patient address:   [place] [address],  Total Time spent with patient: 45 minutes  Date of Admission:  06/11/2019 Date of Discharge: 06/12/2019  Reason for Admission:   [name] is well-known to the service and despite being discharged on 2/5 she presented again on 2/10 through the emergency department with a cluster of symptoms and was placed into observation, released on 211, only to present once again on 2/14.  At this point she reported auditory hallucinations and difficulty with her transitional living situation and was once again placed in the observation beds.  See the note of 2/14   Principal Problem: Bipolar I disorder [facility]) Discharge Diagnoses: Principal Problem:   Bipolar I disorder [facility])   Past Psychiatric History: Extensive  Past Medical History:  Past Medical History:  Diagnosis Date  . Anxiety   . Bipolar 1 disorder ([place])   . Cognitive deficits   . Depression   . Diabetes mellitus without complication ([place])   . Hypertension   . Mental disorder   . Mental health disorder   . Obesity     Past Surgical History:  Procedure Laterality Date  . CESAREAN SECTION    . CESAREAN SECTION N/A 04/25/2013   Procedure: REPEAT CESAREAN SECTION;  Surgeon: [name], MD;  Location: [facility];  Service: Obstetrics;  Laterality: N/A;  . MASS EXCISION N/A 06/03/2012   Procedure: EXCISION MASS;  Surgeon: [name], MD;  Location: [facility];  Service: ENT;  Laterality: N/A;  Excision uvula mass  . TONSILLECTOMY N/A 06/03/2012   Procedure: TONSILLECTOMY;  Surgeon: [name], MD;  Location: [facility];  Service: ENT;  Laterality: N/A;  . TONSILLECTOMY     Family History:  Family History  Problem Relation Age of Onset  . Hypertension Mother   . Diabetes Father     Family Psychiatric  History: See eval Social History:  Social History   Substance and Sexual Activity  Alcohol Use Yes   Comment: occ     Social History   Substance and Sexual Activity  Drug Use Never   Comment: Patient denies    Social History   Socioeconomic History  . Marital status: Single    Spouse name: Not on file  . Number of children: Not on file  . Years of education: Not on file  . Highest education level: Not on file  Occupational History  . Not on file  Tobacco Use  . Smoking status: Current Every Day Smoker    Packs/day: 2.00    Years: 11.00    Pack years: 22.00    Types: Cigarettes  . Smokeless tobacco: Never Used  Substance and Sexual Activity  . Alcohol use: Yes    Comment: occ  . Drug use: Never    Comment: Patient denies  . Sexual activity: Yes    Birth control/protection: Implant  Other Topics Concern  . Not on file  Social History Narrative   ** Merged History Encounter **       Social Determinants of Health   Financial Resource Strain:   . Difficulty of Paying Living Expenses: Not on file  Food Insecurity:   . Worried About Charity fundraiser in the Last Year: Not on file  . Ran Out of Food in the Last Year: Not on file  Transportation Needs:   . Film/video editor (Medical): Not on file  . Lack of Transportation (Non-Medical): Not on file  Physical Activity:   . Days of Exercise per Week: Not on file  . Minutes of Exercise per Session: Not on file  Stress:   . Feeling of Stress : Not on file  Social Connections:   . Frequency of Communication with Friends and Family: Not on file  . Frequency of Social Gatherings with Friends and Family: Not on file  . Attends Religious Services: Not on file  . Active Member of Clubs or Organizations: Not on file  . Attends Archivist Meetings: Not on file  . Marital Status: Not on file    Hospital Course:    Once in the observation unit her perphenazine was reinstituted,  patient claimed that her act team had lowered the dose but we do not find this reflected in the record or prescriptions at any rate by the date of the 15th she did indeed seem stable she was noted to be alert oriented to person place time and situation denying auditory or visual hallucinations and contracting fully in fact she was as calm as I had seen her in the past.  She has no EPS or TD.  There may be some issues of secondary gain perhaps she does not like her current living situation but she had no psychotic symptoms by the date of the 15th and was discharged home with a slight escalation in her perphenazine from 8 mg twice daily to 8 mg morning and 12 at bedtime with use of amantadine for EPS because she is allergic to benztropine by her report  Physical Findings: AIMS: Facial and Oral Movements Muscles of Facial Expression: None, normal Lips and Perioral Area: None, normal Jaw: None, normal Tongue: None, normal,Extremity Movements Upper (arms, wrists, hands, fingers): None, normal Lower (legs, knees, ankles, toes): None, normal, Trunk Movements Neck, shoulders, hips: None, normal, Overall Severity Severity of abnormal movements (highest score from questions above): None, normal Incapacitation due to abnormal movements: None, normal Patient's awareness of abnormal movements (rate only patient's report): No Awareness, Dental Status Current problems with teeth and/or dentures?: No Does patient usually wear dentures?: No  CIWA:  CIWA-Ar Total: 0 COWS:  COWS Total Score: 2  Musculoskeletal: Strength & Muscle Tone: within normal limits Gait & Station: normal Patient leans: N/A  Psychiatric Specialty Exam: Physical Exam  Review of Systems  Blood pressure (!) 107/48, pulse 73, temperature 98.4 F (36.9 C), temperature source Oral, resp. rate 18, height 5\' 7"  (1.702 m), weight 115 kg, SpO2 100 %.Body mass index is 39.71 kg/m.  General Appearance: Casual  Eye Contact:  Good  Speech:   Clear and Coherent  Volume:  Normal  Mood:  Euthymic  Affect:  Appropriate  Thought Process:  Coherent and Goal Directed  Orientation:  Full (Time, Place, and Person)  Thought Content: Denies hallucinations  Suicidal Thoughts:  No  Homicidal Thoughts:  No  Memory:  Immediate;   Fair Recent;   Poor Remote;   Fair  Judgement:  Good  Insight:  Good  Psychomotor Activity:  Normal  Concentration:  Concentration: Good and Attention Span: Good  Recall:  Good  Fund of Knowledge:  Good  Language:  Good  Akathisia:  Negative  Handed:  Right  AIMS (if indicated):     Assets:  Communication Skills Desire for Improvement  ADL's:  Intact  Cognition:  WNL  Sleep:  Has this patient used any form of tobacco in the last 30 days? (Cigarettes, Smokeless Tobacco, Cigars, and/or Pipes) Yes, No  Blood Alcohol level:  Lab Results  Component Value Date   ETH <10 06/11/2019   ETH <10 [id number]    Metabolic Disorder Labs:  Lab Results  Component Value Date   HGBA1C 5.2 05/19/2019   MPG 102.54 05/19/2019   MPG 108.28 10/11/2018   Lab Results  Component Value Date   PROLACTIN 5.3 05/19/2019   PROLACTIN 5.2 10/11/2018   Lab Results  Component Value Date   CHOL 172 05/19/2019   TRIG 75 05/19/2019   HDL 45 05/19/2019   CHOLHDL 3.8 05/19/2019   VLDL 15 05/19/2019   LDLCALC 112 (H) 05/19/2019   [place] NOT CALCULATED 10/11/2018    See Psychiatric Specialty Exam and Suicide Risk Assessment completed by Attending Physician prior to discharge.  Discharge destination:  Home  Is patient on multiple antipsychotic therapies at discharge:  No   Has Patient had three or more failed trials of antipsychotic monotherapy by history:  No  Recommended Plan for Multiple Antipsychotic Therapies: NA   Allergies as of 06/12/2019      Reactions   Wellbutrin [bupropion] Shortness Of Breath   Omnipaque [iohexol] Swelling, Other (See Comments)   Reaction:  Eye swelling   Penicillins  Hives, Other (See Comments)   Has patient had a PCN reaction causing immediate rash, facial/tongue/throat swelling, SOB or lightheadedness with hypotension: Unknown Has patient had a PCN reaction causing severe rash involving mucus membranes or skin necrosis: Yes Has patient had a PCN reaction that required hospitalization Unknown Has patient had a PCN reaction occurring within the last 10 years: No If all of the above answers are "NO", then may proceed with Cephalosporin use.   Cogentin [benztropine]    Make pt feel crazy   Depakote Er [divalproex Sodium Er] Nausea And Vomiting   Divalproex Sodium    Iohexol       Medication List    TAKE these medications     Indication  amantadine 100 MG capsule Commonly known as: SYMMETREL Take 1 capsule (100 mg total) by mouth 2 (two) times daily for 7 days.  Indication: Extrapyramidal Reaction caused by Medications   ARIPiprazole ER 400 MG Srer injection Commonly known as: ABILIFY MAINTENA Inject 2 mLs (400 mg total) into the muscle every 28 (twenty-eight) days. Start taking on: June 28, 2019  Indication: MIXED BIPOLAR AFFECTIVE DISORDER   diphenhydrAMINE 25 MG tablet Commonly known as: SOMINEX Take 25 mg by mouth at bedtime as needed for sleep.  Indication: Trouble Sleeping   insulin detemir 100 UNIT/ML injection Commonly known as: LEVEMIR Inject 40 Units into the skin 2 (two) times daily.  Indication: Type 2 Diabetes   lithium carbonate 450 MG CR tablet Commonly known as: ESKALITH Take 2 tablets (900 mg total) by mouth at bedtime.  Indication: Depressive Phase of Manic-Depression   perphenazine 4 MG tablet Commonly known as: TRILAFON 2 in a m 3 at h s What changed:   medication strength  how much to take  how to take this  when to take this  additional instructions  Indication: Schizophrenia   propranolol 10 MG tablet Commonly known as: INDERAL Take 1 tablet (10 mg total) by mouth 2 (two) times daily.  Indication:  High Blood Pressure Disorder, Lithium-Induced Tremor       SignedJohnn Hai, MD 06/12/2019, 10:09 AM

## 2019-06-12 NOTE — Discharge Instructions (Signed)
For your behavioral health needs, you are advised to continue treatment with the [facility]: ° °     [facility] ACT Team °     [facility], [address] °     [address] °     [place], [place]  [address] °     [phone] °     Crisis number: [phone] °

## 2019-06-21 ENCOUNTER — Ambulatory Visit (HOSPITAL_COMMUNITY)
Admission: EM | Admit: 2019-06-21 | Discharge: 2019-06-21 | Disposition: A | Discharge status: Home / Self Care | Payer: Medicaid Other | Attending: Family Medicine | Admitting: Family Medicine

## 2019-06-21 ENCOUNTER — Other Ambulatory Visit: Payer: Self-pay

## 2019-06-21 ENCOUNTER — Encounter (HOSPITAL_COMMUNITY): Payer: Self-pay

## 2019-06-21 DIAGNOSIS — Z3202 Encounter for pregnancy test, result negative: Secondary | ICD-10-CM

## 2019-06-21 DIAGNOSIS — R112 Nausea with vomiting, unspecified: Secondary | ICD-10-CM | POA: Diagnosis not present

## 2019-06-21 DIAGNOSIS — R102 Pelvic and perineal pain: Secondary | ICD-10-CM | POA: Insufficient documentation

## 2019-06-21 LAB — POCT URINALYSIS DIP (DEVICE)
Bilirubin Urine: NEGATIVE
Glucose, UA: NEGATIVE mg/dL
Hgb urine dipstick: NEGATIVE
Leukocytes,Ua: NEGATIVE
Nitrite: NEGATIVE
Protein, ur: NEGATIVE mg/dL
Specific Gravity, Urine: >=1.03 (ref 1.005–1.030)
Urobilinogen, UA: 0.2 mg/dL (ref 0.0–1.0)
pH: 6 (ref 5.0–8.0)

## 2019-06-21 LAB — POCT PREGNANCY, URINE: Preg Test, Ur: NEGATIVE

## 2019-06-21 LAB — POC URINE PREG, ED: Preg Test, Ur: NEGATIVE

## 2019-06-21 MED ORDER — FLUCONAZOLE 150 MG PO TABS: 150.0000 mg | ORAL_TABLET | Freq: Once | ORAL | 0 refills | Status: AC

## 2019-06-21 MED ORDER — ONDANSETRON 4 MG PO TBDP: 4.0000 mg | ORAL_TABLET | Freq: Three times a day (TID) | ORAL | 0 refills | Status: DC | PRN

## 2019-06-21 NOTE — ED Triage Notes (Addendum)
Pt state she has been vomiting off na d on for 4 days. Pt states her period is late. Pt she has nausea as well. Pt has some vaginal pain this started today. Pt states she had Covid test 3 days ago.

## 2019-06-21 NOTE — ED Provider Notes (Signed)
[facility]    CSN: [id number] Arrival date & time: 06/21/19  1512      History   Chief Complaint Chief Complaint  Patient presents with  . Emesis    HPI [name] is a 29 y.o. female history of hypertension, DM type II, tobacco use, presenting today for evaluation of nausea and vomiting.  Patient states that over the past 3 to 4 days she has had nausea and vomiting which is mainly been in the morning.  Symptoms subside after morning time.  She denies associated abdominal pain.  Denies associated diarrhea.  Has started to develop some vaginal pain.  Denies discharge, itching or irritation.  Denies urinary symptoms of dysuria, increased frequency or urgency.  Denies hematuria.  Patient does state that she is late on her menstrual cycle.  Last menstrual cycle was around January 7.  Is not on any form of birth control.  Reports using protection with recent partner.  Denies associated URI symptoms.  Had Covid test 3 days ago which was negative.  She has not used any medicines for symptoms.  HPI  Past Medical History:  Diagnosis Date  . Anxiety   . Bipolar 1 disorder ([place])   . Cognitive deficits   . Depression   . Diabetes mellitus without complication ([place])   . Hypertension   . Mental disorder   . Mental health disorder   . Obesity     Patient Active Problem List   Diagnosis Date Noted  . MDD (major depressive disorder) 10/10/2018  . Schizoaffective disorder, bipolar type ([place]) 09/25/2018  . Bipolar I disorder ([place]) 06/13/2018  . HTN (hypertension) 05/03/2018  . Tobacco use disorder 05/03/2018  . Bipolar I disorder, most recent episode depressed, severe without psychotic features ([place]) 05/02/2018  . Adjustment disorder with emotional disturbance 01/02/2018  . 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 Moderate bipolar I disorder, most recent episode depressed ([place])   . Psychosis ([place])   . Adjustment disorder with mixed disturbance of emotions and  conduct 08/03/2017  . 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 OCD (obsessive compulsive disorder) 10/05/2016  . Major depressive disorder, recurrent episode, mild ([place]) 05/04/2016  . 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 Learning disability 07/18/2015  . 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 Diabetes mellitus ([place]) 07/18/2015  . 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 Hyperlipidemia 07/18/2015  . Severe episode of recurrent major depressive disorder, without psychotic features ([place])   . Suicidal ideation   . Drug overdose   . Cognitive deficits 10/12/2012  . Generalized anxiety disorder 06/28/2012    Past Surgical History:  Procedure Laterality Date  . CESAREAN SECTION    . CESAREAN SECTION N/A 04/25/2013   Procedure: REPEAT CESAREAN SECTION;  Surgeon: [name], MD;  Location: [facility];  Service: Obstetrics;  Laterality: N/A;  . MASS EXCISION N/A 06/03/2012   Procedure: EXCISION MASS;  Surgeon: [name], MD;  Location: [facility];  Service: ENT;  Laterality: N/A;  Excision uvula mass  . TONSILLECTOMY N/A 06/03/2012   Procedure: TONSILLECTOMY;  Surgeon: [name], MD;  Location: [facility];  Service: ENT;  Laterality: N/A;  . TONSILLECTOMY      OB History    Gravida  3   Para  3   Term  3   Preterm  0   AB  0   Living  3     SAB  0   TAB  0  Ectopic  0   Multiple      Live Births  3            Home Medications    Prior to Admission medications   Medication Sig Start Date End Date Taking? Authorizing Provider  amantadine (SYMMETREL) 100 MG capsule Take 1 capsule (100 mg total) by mouth 2 (two) times daily for 7 days. 06/12/19 06/19/19  Johnn Hai, MD  ARIPiprazole ER (ABILIFY MAINTENA) 400 MG SRER injection Inject 2 mLs (400 mg total) into the muscle every 28 (twenty-eight) days. 06/28/19   Johnn Hai, MD  diphenhydrAMINE (SOMINEX) 25 MG tablet Take 25 mg by  mouth at bedtime as needed for sleep.    [provider]  fluconazole (DIFLUCAN) 150 MG tablet Take 1 tablet (150 mg total) by mouth once for 1 dose. 06/21/19 06/21/19  Awanda Wilcock C, PA-C  insulin detemir (LEVEMIR) 100 UNIT/ML injection Inject 40 Units into the skin 2 (two) times daily.     [provider]  lithium carbonate (ESKALITH) 450 MG CR tablet Take 2 tablets (900 mg total) by mouth at bedtime. 06/02/19   Johnn Hai, MD  ondansetron (ZOFRAN ODT) 4 MG disintegrating tablet Take 1 tablet (4 mg total) by mouth every 8 (eight) hours as needed for nausea or vomiting. 06/21/19   Wana Mount C, PA-C  perphenazine (TRILAFON) 4 MG tablet 2 in a m 3 at h s 06/12/19   Johnn Hai, MD  propranolol (INDERAL) 10 MG tablet Take 1 tablet (10 mg total) by mouth 2 (two) times daily. 06/02/19   Johnn Hai, MD    Family History Family History  Problem Relation Age of Onset  . Hypertension Mother   . Diabetes Father     Social History Social History   Tobacco Use  . Smoking status: Current Every Day Smoker    Packs/day: 2.00    Years: 11.00    Pack years: 22.00    Types: Cigarettes  . Smokeless tobacco: Never Used  Substance Use Topics  . Alcohol use: Yes    Comment: occ  . Drug use: Never    Comment: Patient denies     Allergies   Wellbutrin [bupropion], Omnipaque [iohexol], Penicillins, Cogentin [benztropine], Depakote er [divalproex sodium er], Divalproex sodium, and Iohexol   Review of Systems Review of Systems  Constitutional: Negative for fever.  Respiratory: Negative for shortness of breath.   Cardiovascular: Negative for chest pain.  Gastrointestinal: Positive for nausea and vomiting. Negative for abdominal pain and diarrhea.  Genitourinary: Positive for vaginal pain. Negative for dysuria, flank pain, genital sores, hematuria, menstrual problem, vaginal bleeding and vaginal discharge.  Musculoskeletal: Negative for back pain.  Skin: Negative for rash.    Neurological: Negative for dizziness, light-headedness and headaches.     Physical Exam Triage Vital Signs ED Triage Vitals  Enc Vitals Group     BP 06/21/19 1546 132/83     Pulse Rate 06/21/19 1546 96     Resp 06/21/19 1546 18     Temp 06/21/19 1546 98.9 F (37.2 C)     Temp Source 06/21/19 1546 Oral     SpO2 06/21/19 1546 100 %     Weight 06/21/19 1545 261 lb (118.4 kg)     Height --      Head Circumference --      Peak Flow --      Pain Score 06/21/19 1544 4     Pain Loc --      Pain  Edu? --      Excl. in [place]? --    No data found.  Updated Vital Signs BP 132/83 (BP Location: Right Arm)   Pulse 96   Temp 98.9 F (37.2 C) (Oral)   Resp 18   Wt 261 lb (118.4 kg)   LMP 05/04/2019   SpO2 100%   BMI 40.88 kg/m   Visual Acuity Right Eye Distance:   Left Eye Distance:   Bilateral Distance:    Right Eye Near:   Left Eye Near:    Bilateral Near:     Physical Exam Vitals and nursing note reviewed.  Constitutional:      Appearance: She is well-developed.     Comments: No acute distress  HENT:     Head: Normocephalic and atraumatic.     Nose: Nose normal.     Mouth/Throat:     Comments: Oral mucosa pink and moist, no tonsillar enlargement or exudate. Posterior pharynx patent and nonerythematous, no uvula deviation or swelling. Normal phonation. Eyes:     Conjunctiva/sclera: Conjunctivae normal.  Cardiovascular:     Rate and Rhythm: Normal rate.  Pulmonary:     Effort: Pulmonary effort is normal. No respiratory distress.     Comments: Breathing comfortably at rest, CTABL, no wheezing, rales or other adventitious sounds auscultated Abdominal:     General: There is no distension.     Comments: Soft, nondistended, nontender to light deep palpation throughout abdomen  Genitourinary:    Comments: Normal external female genitalia, no rashes or lesions noted, vaginal mucosa pink, whitish clearish discharge present, no cervical erythema Musculoskeletal:         General: Normal range of motion.     Cervical back: Neck supple.  Skin:    General: Skin is warm and dry.  Neurological:     Mental Status: She is alert and oriented to person, place, and time.      UC Treatments / Results  Labs (all labs ordered are listed, but only abnormal results are displayed) Labs Reviewed  POCT URINALYSIS DIP (DEVICE) - Abnormal; Notable for the following components:      Result Value   Ketones, ur TRACE (*)    All other components within normal limits  POCT PREGNANCY, URINE  POC URINE PREG, ED  CERVICOVAGINAL ANCILLARY ONLY    EKG   Radiology No results found.  Procedures Procedures (including critical care time)  Medications Ordered in UC Medications - No data to display  Initial Impression / Assessment and Plan / UC Course  I have reviewed the triage vital signs and the nursing notes.  Pertinent labs & imaging results that were available during my care of the patient were reviewed by me and considered in my medical decision making (see chart for details).     Pregnancy test negative, UA suggestive of mild dehydration, negative for leuks and nitrites, negative hemoglobin.  Vaginal swab pending to check for gonorrhea, chlamydia and trichomonas as well as yeast and BV.  Empirically treating for yeast today with Diflucan, Zofran as needed for nausea and vomiting.  Discussed with patient to follow-up with OB/GYN if continuing to have amenorrhea/irregular cycles.  Discussed strict return precautions. Patient verbalized understanding and is agreeable with plan.  Final Clinical Impressions(s) / UC Diagnoses   Final diagnoses:  Non-intractable vomiting with nausea, unspecified vomiting type  Vaginal pain     Discharge Instructions     Pregnancy test negative Please use Zofran as needed for nausea/vomiting-dissolves in mouth-every 8 hours  as needed 1 Diflucan today to treat for yeast, may repeat in 3 to 4 days if still having  symptoms Vaginal swab results pending to check for any other vaginal infections, we will call with results and send in further medicine if needed  Please follow-up if symptoms not improving or worsening    ED Prescriptions    Medication Sig Dispense Auth. Provider   ondansetron (ZOFRAN ODT) 4 MG disintegrating tablet Take 1 tablet (4 mg total) by mouth every 8 (eight) hours as needed for nausea or vomiting. 20 tablet Asaph Serena C, PA-C   fluconazole (DIFLUCAN) 150 MG tablet Take 1 tablet (150 mg total) by mouth once for 1 dose. 2 tablet [place], [place] C, PA-C     PDMP not reviewed this encounter.   Janith Lima, [place] 06/21/19 1625

## 2019-06-21 NOTE — Discharge Instructions (Signed)
Pregnancy test negative Please use Zofran as needed for nausea/vomiting-dissolves in mouth-every 8 hours as needed 1 Diflucan today to treat for yeast, may repeat in 3 to 4 days if still having symptoms Vaginal swab results pending to check for any other vaginal infections, we will call with results and send in further medicine if needed  Please follow-up if symptoms not improving or worsening

## 2019-06-22 ENCOUNTER — Ambulatory Visit (HOSPITAL_COMMUNITY)
Admission: RE | Admit: 2019-06-22 | Discharge: 2019-06-22 | Disposition: A | Discharge status: Home / Self Care | Payer: Medicaid Other | Attending: Psychiatry | Admitting: Psychiatry

## 2019-06-22 DIAGNOSIS — F319 Bipolar disorder, unspecified: Secondary | ICD-10-CM | POA: Insufficient documentation

## 2019-06-23 ENCOUNTER — Observation Stay (HOSPITAL_COMMUNITY)
Admission: RE | Admit: 2019-06-23 | Discharge: 2019-06-23 | Disposition: A | Discharge status: Home / Self Care | Payer: Medicaid Other | Attending: Psychiatry | Admitting: Psychiatry

## 2019-06-23 ENCOUNTER — Other Ambulatory Visit: Payer: Self-pay

## 2019-06-23 ENCOUNTER — Encounter (HOSPITAL_COMMUNITY): Payer: Self-pay | Admitting: Psychiatry

## 2019-06-23 DIAGNOSIS — Z20822 Contact with and (suspected) exposure to covid-19: Secondary | ICD-10-CM | POA: Insufficient documentation

## 2019-06-23 DIAGNOSIS — Z794 Long term (current) use of insulin: Secondary | ICD-10-CM | POA: Diagnosis not present

## 2019-06-23 DIAGNOSIS — E669 Obesity, unspecified: Secondary | ICD-10-CM | POA: Insufficient documentation

## 2019-06-23 DIAGNOSIS — F17210 Nicotine dependence, cigarettes, uncomplicated: Secondary | ICD-10-CM | POA: Diagnosis not present

## 2019-06-23 DIAGNOSIS — F639 Impulse disorder, unspecified: Secondary | ICD-10-CM | POA: Diagnosis not present

## 2019-06-23 DIAGNOSIS — E119 Type 2 diabetes mellitus without complications: Secondary | ICD-10-CM | POA: Diagnosis not present

## 2019-06-23 DIAGNOSIS — F3130 Bipolar disorder, current episode depressed, mild or moderate severity, unspecified: Secondary | ICD-10-CM | POA: Diagnosis present

## 2019-06-23 DIAGNOSIS — F319 Bipolar disorder, unspecified: Secondary | ICD-10-CM | POA: Diagnosis present

## 2019-06-23 DIAGNOSIS — I10 Essential (primary) hypertension: Secondary | ICD-10-CM | POA: Insufficient documentation

## 2019-06-23 LAB — COMPREHENSIVE METABOLIC PANEL
ALT: 21 U/L (ref 0–44)
AST: 21 U/L (ref 15–41)
Albumin: 4 g/dL (ref 3.5–5.0)
Alkaline Phosphatase: 38 U/L (ref 38–126)
Anion gap: 7 (ref 5–15)
BUN: 15 mg/dL (ref 6–20)
CO2: 27 mmol/L (ref 22–32)
Calcium: 9 mg/dL (ref 8.9–10.3)
Chloride: 106 mmol/L (ref 98–111)
Creatinine, Ser: 0.79 mg/dL (ref 0.44–1.00)
GFR calc Af Amer: >60 mL/min (ref >60)
GFR calc non Af Amer: >60 mL/min (ref >60)
Glucose, Bld: 118 mg/dL — ABNORMAL HIGH (ref 70–99)
Potassium: 3.9 mmol/L (ref 3.5–5.1)
Sodium: 140 mmol/L (ref 135–145)
Total Bilirubin: 0.6 mg/dL (ref 0.3–1.2)
Total Protein: 7.3 g/dL (ref 6.5–8.1)

## 2019-06-23 LAB — HEMOGLOBIN A1C
Hgb A1c MFr Bld: 5.2 % (ref 4.8–5.6)
Mean Plasma Glucose: 102.54 mg/dL

## 2019-06-23 LAB — HEPATIC FUNCTION PANEL
ALT: 19 U/L (ref 0–44)
AST: 21 U/L (ref 15–41)
Albumin: 3.9 g/dL (ref 3.5–5.0)
Alkaline Phosphatase: 37 U/L — ABNORMAL LOW (ref 38–126)
Bilirubin, Direct: 0.1 mg/dL (ref 0.0–0.2)
Indirect Bilirubin: 0.4 mg/dL (ref 0.3–0.9)
Total Bilirubin: 0.5 mg/dL (ref 0.3–1.2)
Total Protein: 7.2 g/dL (ref 6.5–8.1)

## 2019-06-23 LAB — ETHANOL: Alcohol, Ethyl (B): <10 mg/dL (ref <10)

## 2019-06-23 LAB — RESPIRATORY PANEL BY RT PCR (FLU A&B, COVID)
Influenza A by PCR: NEGATIVE
Influenza B by PCR: NEGATIVE
SARS Coronavirus 2 by RT PCR: NEGATIVE

## 2019-06-23 LAB — LIPID PANEL
Cholesterol: 152 mg/dL (ref 0–200)
HDL: 43 mg/dL (ref >40)
LDL Cholesterol: 85 mg/dL (ref 0–99)
Total CHOL/HDL Ratio: 3.5 RATIO
Triglycerides: 120 mg/dL (ref <150)
VLDL: 24 mg/dL (ref 0–40)

## 2019-06-23 LAB — MAGNESIUM: Magnesium: 2.1 mg/dL (ref 1.7–2.4)

## 2019-06-23 LAB — TSH: TSH: 1.032 u[IU]/mL (ref 0.350–4.500)

## 2019-06-23 MED ORDER — ACETAMINOPHEN 325 MG PO TABS: 650.0000 mg | ORAL_TABLET | Freq: Four times a day (QID) | ORAL | Status: DC | PRN

## 2019-06-23 MED ORDER — PERPHENAZINE 4 MG PO TABS: 8.0000 mg | ORAL_TABLET | Freq: Three times a day (TID) | ORAL | Status: DC

## 2019-06-23 MED ORDER — PERPHENAZINE 8 MG PO TABS: ORAL_TABLET | ORAL | 1 refills | Status: DC

## 2019-06-23 NOTE — Discharge Instructions (Signed)
For your behavioral health needs, you are advised to continue treatment with the [facility]: ° °     [facility] ACT Team °     [facility], [address] °     [address] °     [place], [place]  [address] °     [phone] °     Crisis number: [phone] °

## 2019-06-23 NOTE — BH Assessment (Signed)
Assessment Note  [name] is a 29 y.o. female who came to [facility] due to "negative thinking" and having difficulties at her transitional living home, which she shares she does not want to get kicked out of. Pt states she was angry, which resulted in her, "throwing a chair down, yelling, and screaming." Pt stated the transitional living staff had pt come to [facility] so they didn't have to call the police or have pt kicked out of their program, as pt states her goal is to work through this program and eventually get her own home.  Pt states she was recently inpt at [facility] (from 05/29/2019 - 06/05/2019) and that, while she was here, she "was prescribed a new medication and it was working well," but she shares her ACT Team hasn't brought her this new medication since she returned home, so she's afraid that her behaviors will again get worse and she'll lose her placement. Pt shared she was experiencing AH calling her "a bald bitch" prior to her admission into [facility], which angered her, and that, with this new medication she hasn't experienced hearing these voices again and she doesn't want to.  Pt denies SI, HI, current AVH, NSSIB, access to guns/weapons, engagement in the legal system, and SA.   Pt provided clinician verbal consent to contact her ACT Team to inquire about her medication concerns. Clinician was able to contact the ACT On-Call and spoke to Ms. Erma Pinto, Case Manager, who read a note that was put into pt's chart today by pt's PA, Natasha Bence. The note stated that pt had told her PA the last time they met that she no longer wanted to take her new medication, Perphenazine, because it makes her so tired that she's unable to stay awake. Clinician thanked Ms. Vick and informed her that this information would be documented in the assessment to ensure pt's needs can best be met.  Pt's protective factors include current lack of psychosis, consistent service providers, no hx of HI, pt's desire to keep her  housing and do well so she can keep her housing, and consistent medical care.  Pt is oriented x4. Her recent and remote memory is intact. Pt was cooperative throughout the assessment process. Pt's insight and judgement is fair; her impulse control is poor.   Diagnosis: F31.9, Bipolar I disorder, Current or most recent episode unspecified   Past Medical History:  Past Medical History:  Diagnosis Date  . Anxiety   . Bipolar 1 disorder ([place])   . Cognitive deficits   . Depression   . Diabetes mellitus without complication ([place])   . Hypertension   . Mental disorder   . Mental health disorder   . Obesity     Past Surgical History:  Procedure Laterality Date  . CESAREAN SECTION    . CESAREAN SECTION N/A 04/25/2013   Procedure: REPEAT CESAREAN SECTION;  Surgeon: [name], MD;  Location: [facility];  Service: Obstetrics;  Laterality: N/A;  . MASS EXCISION N/A 06/03/2012   Procedure: EXCISION MASS;  Surgeon: [name], MD;  Location: [place];  Service: ENT;  Laterality: N/A;  Excision uvula mass  . TONSILLECTOMY N/A 06/03/2012   Procedure: TONSILLECTOMY;  Surgeon: [name], MD;  Location: [place];  Service: ENT;  Laterality: N/A;  . TONSILLECTOMY      Family History:  Family History  Problem Relation Age of Onset  . Hypertension Mother   . Diabetes Father     Social History:  reports that she has been smoking cigarettes. She has a 22.00 pack-year smoking history. She has never used smokeless tobacco. She reports current alcohol use. She reports that she does not use drugs.  Additional Social History:  Alcohol / Drug Use Pain Medications: Please see MAR Prescriptions: Please see MAR Over the Counter: Please see MAR History of alcohol / drug use?: No history of alcohol / drug abuse Longest period of sobriety (when/how long): Unknown  CIWA:   COWS:    Allergies:  Allergies  Allergen Reactions  . Wellbutrin [Bupropion] Shortness  Of Breath  . Omnipaque [Iohexol] Swelling and Other (See Comments)    Reaction:  Eye swelling  . Penicillins Hives and Other (See Comments)    Has patient had a PCN reaction causing immediate rash, facial/tongue/throat swelling, SOB or lightheadedness with hypotension: Unknown Has patient had a PCN reaction causing severe rash involving mucus membranes or skin necrosis: Yes Has patient had a PCN reaction that required hospitalization Unknown Has patient had a PCN reaction occurring within the last 10 years: No If all of the above answers are "NO", then may proceed with Cephalosporin use.  Herma Mering [Benztropine]     Make pt feel crazy  . Depakote Er [Divalproex Sodium Er] Nausea And Vomiting  . Divalproex Sodium   . Iohexol     Home Medications: (Not in a hospital admission)   OB/GYN Status:  No LMP recorded.  General Assessment Data Location of Assessment: [facility] Services TTS Assessment: In system Is this a Tele or Face-to-Face Assessment?: Face-to-Face Is this an Initial Assessment or a Re-assessment for this encounter?: Initial Assessment Patient Accompanied by:: N/A Language Other than English: No Living Arrangements: Other (Comment)(Pt lives in transitional housing) What gender do you identify as?: Female Marital status: Single Pregnancy Status: Unknown Living Arrangements: Other (Comment)(Pt lives with others in transitional housing) Can pt return to current living arrangement?: Yes Admission Status: Voluntary Is patient capable of signing voluntary admission?: Yes Referral Source: Self/Family/Friend Insurance type: [employer] Medicaid  Medical Screening Exam ([place]) Medical Exam completed: Yes  Crisis Care Plan Living Arrangements: Other (Comment)(Pt lives with others in transitional housing) Legal Guardian: Other:(Self) Name of Psychiatrist: Natasha Bence, [place] [facility]. Name of Therapist: [facility] Team.  Education Status Is patient currently in school?: No Is the patient employed, unemployed or receiving disability?: Receiving disability income  Risk to self with the past 6 months Suicidal Ideation: No Has patient been a risk to self within the past 6 months prior to admission? : Yes Suicidal Intent: No Has patient had any suicidal intent within the past 6 months prior to admission? : Yes Is patient at risk for suicide?: No Suicidal Plan?: No Has patient had any suicidal plan within the past 6 months prior to admission? : Yes Access to Means: (N/A) Specify Access to Suicidal Means: N/A What has been your use of drugs/alcohol within the last 12 months?: Pt denies SA Previous Attempts/Gestures: Yes How many times?: (Multiple, though the last incident was years ago) Other Self Harm Risks: Pt is having difficulties getting along w/ others Triggers for Past Attempts: Unknown Intentional Self Injurious Behavior: None Comment - Self Injurious Behavior: N/A Family Suicide History: No Recent stressful life event(s): Conflict (Comment)(Conflict w/ Transitional Living staff, peers) Persecutory voices/beliefs?: No Depression: No Depression Symptoms: Fatigue, Loss of interest in usual pleasures, Feeling angry/irritable Substance abuse history and/or treatment for substance abuse?: No Suicide prevention information given to non-admitted  patients: Not applicable  Risk to Others within the past 6 months Homicidal Ideation: No Does patient have any lifetime risk of violence toward others beyond the six months prior to admission? : Yes (comment) Thoughts of Harm to Others: No Current Homicidal Intent: No Current Homicidal Plan: No Access to Homicidal Means: No Describe Access to Homicidal Means: N/A Identified Victim: None noted History of harm to others?: No Assessment of Violence: On admission Violent Behavior Description: None noted Does patient have access to weapons?: No(Pt  denied access to guns/weapons) Criminal Charges Pending?: No Does patient have a court date: No Is patient on probation?: No  Psychosis Hallucinations: Auditory Delusions: None noted  Mental Status Report Appearance/Hygiene: Unremarkable Eye Contact: Good Motor Activity: Unremarkable Speech: Logical/coherent Level of Consciousness: Alert Mood: Other (Comment)(Frustrated) Affect: Appropriate to circumstance Anxiety Level: Minimal Thought Processes: Coherent Judgement: Partial Orientation: Person, Place, Time, Situation Obsessive Compulsive Thoughts/Behaviors: Moderate  Cognitive Functioning Concentration: Normal Memory: Recent Intact, Remote Intact Is patient IDD: No Insight: Fair Impulse Control: Poor Appetite: Good Have you had any weight changes? : No Change Sleep: Decreased Total Hours of Sleep: 6 Vegetative Symptoms: None  ADLScreening [facility] Assessment [facility]) Patient's cognitive ability adequate to safely complete daily activities?: Yes Patient able to express need for assistance with ADLs?: Yes Independently performs ADLs?: Yes (appropriate for developmental age)  Prior Inpatient Therapy Prior Inpatient Therapy: Yes Prior Therapy Dates: Per chart, "2021, 2020, 2019 and mult other admissions."  Prior Therapy Facilty/Provider(s): Per chart, "[facility], [place]."  Reason for Treatment: Per chart, "Bipolar."   Prior Outpatient Therapy Prior Outpatient Therapy: No Does patient have an ACCT team?: Yes Does patient have Intensive In-House Services?  : No Does patient have [facility] services? : No Does patient have P4CC services?: No  ADL Screening (condition at time of admission) Patient's cognitive ability adequate to safely complete daily activities?: Yes Is the patient deaf or have difficulty hearing?: No Does the patient have difficulty seeing, even when wearing glasses/contacts?: No Does the patient have difficulty concentrating, remembering, or making  decisions?: No Patient able to express need for assistance with ADLs?: Yes Does the patient have difficulty dressing or bathing?: No Independently performs ADLs?: Yes (appropriate for developmental age) Does the patient have difficulty walking or climbing stairs?: No Weakness of Legs: None Weakness of Arms/Hands: None  Home Assistive Devices/Equipment Home Assistive Devices/Equipment: None  Therapy Consults (therapy consults require a physician order) PT Evaluation Needed: No OT Evalulation Needed: No SLP Evaluation Needed: No Abuse/Neglect Assessment (Assessment to be complete while patient is alone) Abuse/Neglect Assessment Can Be Completed: Unable to assess, patient is non-responsive or altered mental status Values / Beliefs Cultural Requests During Hospitalization: None Spiritual Requests During Hospitalization: None Consults Spiritual Care Consult Needed: No Transition of Care Team Consult Needed: No Advance Directives (For Healthcare) Does Patient Have a Medical Advance Directive?: No Would patient like information on creating a medical advance directive?: No - Patient declined          Disposition: Adaku Anike, NP, reviewed pt's chart and information and met with pt and determined pt should be observed overnight to assist in attempting to ensure her medication needs are met, including getting her back on her medication, making contact with her ACT Team, and providing prescriptions, if needed. Pt was placed on the Observation Unit in Room 207.   Disposition Initial Assessment Completed for this Encounter: Yes Disposition of Patient: (Adaku Anike determined pt should be observed overnight) Patient refused recommended treatment: No  Mode of transportation if patient is discharged/movement?: N/A Patient referred to: Other (Comment)(Pt will be observed overnight for safety and stability)  On Site Evaluation by:   Reviewed with Physician:    Dannielle Burn 06/23/2019  2:17 AM

## 2019-06-23 NOTE — Progress Notes (Signed)
Pt discharged to lobby. Pt was stable and appreciative at that time. All papers, samples and prescriptions were given and valuables returned. Verbal understanding expressed. Denies SI/HI and A/VH. Pt given opportunity to express concerns and ask questions.  

## 2019-06-23 NOTE — Discharge Summary (Signed)
Physician Discharge Summary Note  Patient:  [name] is an 29 y.o., female MRN:  [id number] DOB:  1990-06-06 Patient phone:  [phone] (home)  Patient address:   [address] [place] [address],  Total Time spent with patient: 45 minutes  Date of Admission:  06/23/2019 Date of Discharge: 06/23/2019  Reason for Admission:   [name] is a 29 y.o. female who came to [facility] due to "negative thinking" and having difficulties at her transitional living home, which she shares she does not want to get kicked out of. Pt states she was angry, which resulted in her, "throwing a chair down, yelling, and screaming." Pt stated the transitional living staff had pt come to [facility] so they didn't have to call the police or have pt kicked out of their program, as pt states her goal is to work through this program and eventually get her own home.  Pt states she was recently inpt at [facility] (from 05/29/2019 - 06/05/2019) and that, while she was here, she "was prescribed a new medication and it was working well," but she shares her ACT Team hasn't brought her this new medication since she returned home, so she's afraid that her behaviors will again get worse and she'll lose her placement. Pt shared she was experiencing AH calling her "a bald bitch" prior to her admission into [facility], which angered her, and that, with this new medication she hasn't experienced hearing these voices again and she doesn't want to.  Pt denies SI, HI, current AVH, NSSIB, access to guns/weapons, engagement in the legal system, and SA.   Pt provided clinician verbal consent to contact her ACT Team to inquire about her medication concerns. Clinician was able to contact the ACT On-Call and spoke to Ms. Erma Pinto, Case Manager, who read a note that was put into pt's chart today by pt's PA, Natasha Bence. The note stated that pt had told her PA the last time they met that she no longer wanted to take her new medication,  Perphenazine, because it makes her so tired that she's unable to stay awake. Clinician thanked Ms. Vick and informed her that this information would be documented in the assessment to ensure pt's needs can best be met.  Pt's protective factors include current lack of psychosis, consistent service providers, no hx of HI, pt's desire to keep her housing and do well so she can keep her housing, and consistent medical care.  Pt is oriented x4. Her recent and remote memory is intact. Pt was cooperative throughout the assessment process. Pt's insight and judgement is fair; her impulse control is poor.   Diagnosis: F31.9, Bipolar I disorder, Current or most recent episode unspecified Principal Problem: Impulse control disorder Discharge Diagnoses: Principal Problem:   Impulse control disorder Active Problems:   Bipolar 1 disorder (H[facility])   Past Psychiatric History: see chart  Past Medical History:  Past Medical History:  Diagnosis Date  . Anxiety   . Bipolar 1 disorder ([place])   . Cognitive deficits   . Depression   . Diabetes mellitus without complication ([place])   . Hypertension   . Mental disorder   . Mental health disorder   . Obesity     Past Surgical History:  Procedure Laterality Date  . CESAREAN SECTION    . CESAREAN SECTION N/A 04/25/2013   Procedure: REPEAT CESAREAN SECTION;  Surgeon: [name], MD;  Location: [place] [facility];  Service: Obstetrics;  Laterality: N/A;  . MASS EXCISION N/A 06/03/2012  Procedure: EXCISION MASS;  Surgeon: [name], MD;  Location: [place];  Service: ENT;  Laterality: N/A;  Excision uvula mass  . TONSILLECTOMY N/A 06/03/2012   Procedure: TONSILLECTOMY;  Surgeon: [name], MD;  Location: [place];  Service: ENT;  Laterality: N/A;  . TONSILLECTOMY     Family History:  Family History  Problem Relation Age of Onset  . Hypertension Mother   . Diabetes Father    Family Psychiatric  History: see  chart Social History:  Social History   Substance and Sexual Activity  Alcohol Use Yes   Comment: occ     Social History   Substance and Sexual Activity  Drug Use Never   Comment: Patient denies    Social History   Socioeconomic History  . Marital status: Single    Spouse name: Not on file  . Number of children: Not on file  . Years of education: Not on file  . Highest education level: Not on file  Occupational History  . Not on file  Tobacco Use  . Smoking status: Current Every Day Smoker    Packs/day: 2.00    Years: 11.00    Pack years: 22.00    Types: Cigarettes  . Smokeless tobacco: Never Used  Substance and Sexual Activity  . Alcohol use: Yes    Comment: occ  . Drug use: Never    Comment: Patient denies  . Sexual activity: Yes    Birth control/protection: Implant  Other Topics Concern  . Not on file  Social History Narrative   ** Merged History Encounter **       Social Determinants of Health   Financial Resource Strain:   . Difficulty of Paying Living Expenses: Not on file  Food Insecurity:   . Worried About Charity fundraiser in the Last Year: Not on file  . Ran Out of Food in the Last Year: Not on file  Transportation Needs:   . Lack of Transportation (Medical): Not on file  . Lack of Transportation (Non-Medical): Not on file  Physical Activity:   . Days of Exercise per Week: Not on file  . Minutes of Exercise per Session: Not on file  Stress:   . Feeling of Stress : Not on file  Social Connections:   . Frequency of Communication with Friends and Family: Not on file  . Frequency of Social Gatherings with Friends and Family: Not on file  . Attends Religious Services: Not on file  . Active Member of Clubs or Organizations: Not on file  . Attends Archivist Meetings: Not on file  . Marital Status: Not on file    Hospital Course:    [name] was admitted to observation unit, for overall monitoring and stabilization.  It was  determined that her outburst was simply behavioral-by the morning of 926 there was no evidence of hallucinations, delusions, or acute psychosis.  She was alert oriented cooperative and pretty much at baseline.  Again there seem to be behavioral issues but she insisted she had not received treatment perphenazine prescription so we will go ahead and give her samples of that just in case that is accurate.  No EPS or TD.  Musculoskeletal: Strength & Muscle Tone: within normal limits Gait & Station: normal Patient leans: N/A  Psychiatric Specialty Exam: Physical Exam  Review of Systems  There were no vitals taken for this visit.There is no height or weight on file to calculate BMI.  General Appearance:  Casual  Eye Contact:  Good  Speech:  Clear and Coherent  Volume:  Normal  Mood:  Euthymic  Affect:  Full Range  Thought Process:  Coherent and Goal Directed  Orientation:  Full (Time, Place, and Person)  Thought Content:  Logical  Suicidal Thoughts:  No  Homicidal Thoughts:  No  Memory:  Immediate;   Fair Recent;   Fair Remote;   Fair  Judgement:  Intact  Insight:  Fair  Psychomotor Activity:  Normal  Concentration:  Concentration: Fair and Attention Span: Fair  Recall:  [employer] of Knowledge:  Fair  Language:  Fair  Akathisia:  Negative  Handed:  Right  AIMS (if indicated):     Assets:  Communication Skills Desire for Improvement  ADL's:  Intact  Cognition:  WNL  Sleep:           Has this patient used any form of tobacco in the last 30 days? (Cigarettes, Smokeless Tobacco, Cigars, and/or Pipes) Yes, No  Blood Alcohol level:  Lab Results  Component Value Date   ETH <10 06/23/2019   ETH <10 37/90/2409    Metabolic Disorder Labs:  Lab Results  Component Value Date   HGBA1C 5.2 05/19/2019   MPG 102.54 05/19/2019   MPG 108.28 10/11/2018   Lab Results  Component Value Date   PROLACTIN 5.3 05/19/2019   PROLACTIN 5.2 10/11/2018   Lab Results  Component Value Date    CHOL 152 06/23/2019   TRIG 120 06/23/2019   HDL 43 06/23/2019   CHOLHDL 3.5 06/23/2019   VLDL 24 06/23/2019   LDLCALC 85 06/23/2019   LDLCALC 112 (H) 05/19/2019    See Psychiatric Specialty Exam and Suicide Risk Assessment completed by Attending Physician prior to discharge.  Discharge destination:  Home  Is patient on multiple antipsychotic therapies at discharge:  No   Has Patient had three or more failed trials of antipsychotic monotherapy by history:  No  Recommended Plan for Multiple Antipsychotic Therapies: NA   Allergies as of 06/23/2019      Reactions   Wellbutrin [bupropion] Shortness Of Breath   Omnipaque [iohexol] Swelling, Other (See Comments)   Reaction:  Eye swelling   Penicillins Hives, Other (See Comments)   Has patient had a PCN reaction causing immediate rash, facial/tongue/throat swelling, SOB or lightheadedness with hypotension: Unknown Has patient had a PCN reaction causing severe rash involving mucus membranes or skin necrosis: Yes Has patient had a PCN reaction that required hospitalization Unknown Has patient had a PCN reaction occurring within the last 10 years: No If all of the above answers are "NO", then may proceed with Cephalosporin use.   Cogentin [benztropine]    Make pt feel crazy   Depakote Er [divalproex Sodium Er] Nausea And Vomiting   Divalproex Sodium    Iohexol       Medication List    TAKE these medications     Indication  amantadine 100 MG capsule Commonly known as: SYMMETREL Take 1 capsule (100 mg total) by mouth 2 (two) times daily for 7 days.  Indication: Extrapyramidal Reaction caused by Medications   ARIPiprazole ER 400 MG Srer injection Commonly known as: ABILIFY MAINTENA Inject 2 mLs (400 mg total) into the muscle every 28 (twenty-eight) days. Start taking on: June 28, 2019  Indication: MIXED BIPOLAR AFFECTIVE DISORDER   diphenhydrAMINE 25 MG tablet Commonly known as: SOMINEX Take 25 mg by mouth at bedtime as  needed for sleep.  Indication: Trouble Sleeping   insulin  detemir 100 UNIT/ML injection Commonly known as: LEVEMIR Inject 40 Units into the skin 2 (two) times daily.  Indication: Type 2 Diabetes   lithium carbonate 450 MG CR tablet Commonly known as: ESKALITH Take 2 tablets (900 mg total) by mouth at bedtime.  Indication: Depressive Phase of Manic-Depression   ondansetron 4 MG disintegrating tablet Commonly known as: Zofran ODT Take 1 tablet (4 mg total) by mouth every 8 (eight) hours as needed for nausea or vomiting.  Indication: Nausea and Vomiting   perphenazine 8 MG tablet Commonly known as: TRILAFON 1 tid What changed:   medication strength  additional instructions  Indication: Schizophrenia   propranolol 10 MG tablet Commonly known as: INDERAL Take 1 tablet (10 mg total) by mouth 2 (two) times daily.  Indication: High Blood Pressure Disorder, Lithium-Induced Tremor       SignedJohnn Hai, MD 06/23/2019, 8:59 AM

## 2019-06-23 NOTE — BH Assessment (Signed)
[place] Assessment Progress Note  Per Johnn Hai, MD, this pt does not require psychiatric hospitalization at this time.  Pt is to be discharged from the [facility] Observation Unit with recommendation to continue treatment with the [facility] ACT Team.  This has been included in pt's discharge instructions.  Pt's nurse, Benjamine Mola, has been notified.  Jalene Mullet, [place] Triage Specialist [phone]

## 2019-06-23 NOTE — Progress Notes (Signed)
[facility] Crisis Plan  Reason for Crisis Plan:  Chronic Mental Illness/Medical Illness, Crisis Stabilization and Medication Management   Plan of Care:  Referral for Inpatient Hospitalization, Referral for IOP and Referral for Telepsychiatry/Psychiatric Consult  Family Support:      Current Living Environment:  Living Arrangements: Other (Comment)(Pt lives with others in transitional housing)  Insurance:   Hospital Account    Name Acct ID Class Status Primary Coverage   Dorenda, Schwarzkopf [id number] [place]        Guarantor Account (for Hospital Account [id number])    Name Relation to Pt Service Area Active? Acct Type   [name] [facility] Yes [facility]   Address Phone       [address] [place], [place] [address] [phone])          Coverage Information (for Hospital Account [id number])    F/O Payor/Plan Precert #   [facility] MEDICAID/[place] MEDICA[place]    Subscriber Subscriber #   Lummie, Geenen [id number]   Address Phone   [address], [place] [address] [phone]      Legal Guardian:     Primary Care Provider:  Benito Mccreedy, MD  Current Outpatient Providers:  ACT  Psychiatrist:     Counselor/Therapist:     Compliant with Medications:  No  Additional Information:   Lucile Crater 2/26/20215:29 AM

## 2019-06-23 NOTE — H&P (Signed)
[facility] Unit Provider Admission PAA/H&P  Patient Identification: [name] MRN:  [id number] Date of Evaluation:  06/23/2019 Chief Complaint:  Behavior problems Principal Diagnosis: Impulse control disorder Diagnosis:  Principal Problem:   Impulse control disorder  History of Present Illness:   [name] is a 29 y.o who presents voluntarily brought in by [facility] due to behavior problems at the transition house where she resides. Pt reports she was acting out, yelling and throwing chairs because she was upset. Pt states "I thought my friend was flirting with a walker at the transition house so I got upset" Pt reports she was recetly discharged from [facility] 06/05/2019 and have not been on the new medication Perphenazine prescribed to her due to her ACT team not bringing it to her. Pt states this new medication makes her calm and would like to continue taking it so as to not get kicked out of the program. Pt denies SI, HI and self harming behaviors. Pt states she hears voices "calling me a bald headed bitch". Pt denies any drug use or alcohol. Pt has an ACT team through [facility]. Pt states she sleeps 5-6 hours daily, her appetite is good.  During evaluation pt was lying in bed; she is alert/oriented x 4; cooperative; and mood is angry congruent with affect. Pt is speaking in a clear tone at moderate volume, and normal pace; with minimal eye contact. Her thought process is coherent and relevant; There is no indication that she is currently responding to internal/external stimuli or experiencing delusional thought content. Pt judgement, insight and impulse control is fair.   Associated Signs/Symptoms: Depression Symptoms:  anxiety, (Hypo) Manic Symptoms:  Hallucinations, Impulsivity, Irritable Mood, Anxiety Symptoms:  NA Psychotic Symptoms:  Hallucinations: Auditory PTSD Symptoms: NA Total Time spent with patient: 30 minutes  Past Psychiatric History: Yes  Is the  patient at risk to self? No.  Has the patient been a risk to self in the past 6 months? No.  Has the patient been a risk to self within the distant past? No.  Is the patient a risk to others? Yes.    Has the patient been a risk to others in the past 6 months? No.  Has the patient been a risk to others within the distant past? No.   Prior Inpatient Therapy:   Prior Outpatient Therapy:    Alcohol Screening:   Substance Abuse History in the last 12 months:  No. Consequences of Substance Abuse: NA Previous Psychotropic Medications: Yes  Psychological Evaluations: Yes  Past Medical History:  Past Medical History:  Diagnosis Date  . Anxiety   . Bipolar 1 disorder ([place])   . Cognitive deficits   . Depression   . Diabetes mellitus without complication ([place])   . Hypertension   . Mental disorder   . Mental health disorder   . Obesity     Past Surgical History:  Procedure Laterality Date  . CESAREAN SECTION    . CESAREAN SECTION N/A 04/25/2013   Procedure: REPEAT CESAREAN SECTION;  Surgeon: [name], MD;  Location: [facility];  Service: Obstetrics;  Laterality: N/A;  . MASS EXCISION N/A 06/03/2012   Procedure: EXCISION MASS;  Surgeon: [name], MD;  Location: [facility];  Service: ENT;  Laterality: N/A;  Excision uvula mass  . TONSILLECTOMY N/A 06/03/2012   Procedure: TONSILLECTOMY;  Surgeon: [name], MD;  Location: [facility];  Service: ENT;  Laterality: N/A;  . TONSILLECTOMY  Family History:  Family History  Problem Relation Age of Onset  . Hypertension Mother   . Diabetes Father    Family Psychiatric History: Unknown Tobacco Screening:   Social History:  Social History   Substance and Sexual Activity  Alcohol Use Yes   Comment: occ     Social History   Substance and Sexual Activity  Drug Use Never   Comment: Patient denies    Additional Social History:                           Allergies:   Allergies   Allergen Reactions  . Wellbutrin [Bupropion] Shortness Of Breath  . Omnipaque [Iohexol] Swelling and Other (See Comments)    Reaction:  Eye swelling  . Penicillins Hives and Other (See Comments)    Has patient had a PCN reaction causing immediate rash, facial/tongue/throat swelling, SOB or lightheadedness with hypotension: Unknown Has patient had a PCN reaction causing severe rash involving mucus membranes or skin necrosis: Yes Has patient had a PCN reaction that required hospitalization Unknown Has patient had a PCN reaction occurring within the last 10 years: No If all of the above answers are "NO", then may proceed with Cephalosporin use.  Herma Mering [Benztropine]     Make pt feel crazy  . Depakote Er [Divalproex Sodium Er] Nausea And Vomiting  . Divalproex Sodium   . Iohexol    Lab Results:  Results for orders placed or performed during the hospital encounter of 06/23/19 (from the past 48 hour(s))  Respiratory Panel by RT PCR (Flu A&B, Covid) - Nasopharyngeal Swab     Status: None   Collection Time: 06/23/19 12:53 AM   Specimen: Nasopharyngeal Swab  Result Value Ref Range   SARS Coronavirus 2 by RT PCR NEGATIVE NEGATIVE    Comment: (NOTE) SARS-CoV-2 target nucleic acids are NOT DETECTED. The SARS-CoV-2 RNA is generally detectable in upper respiratoy specimens during the acute phase of infection. The lowest concentration of SARS-CoV-2 viral copies this assay can detect is 131 copies/mL. A negative result does not preclude SARS-Cov-2 infection and should not be used as the sole basis for treatment or other patient management decisions. A negative result may occur with  improper specimen collection/handling, submission of specimen other than nasopharyngeal swab, presence of viral mutation(s) within the areas targeted by this assay, and inadequate number of viral copies (<131 copies/mL). A negative result must be combined with clinical observations, patient history, and  epidemiological information. The expected result is Negative. Fact Sheet for Patients:  PinkCheek.be Fact Sheet for Healthcare Providers:  GravelBags.it This test is not yet ap proved or cleared by the [place] FDA and  has been authorized for detection and/or diagnosis of SARS-CoV-2 by FDA under an Emergency Use Authorization (EUA). This EUA will remain  in effect (meaning this test can be used) for the duration of the COVID-19 declaration under Section 564(b)(1) of the Act, 21 U.S.C. section 360bbb-3(b)(1), unless the authorization is terminated or revoked sooner.    Influenza A by PCR NEGATIVE NEGATIVE   Influenza B by PCR NEGATIVE NEGATIVE    Comment: (NOTE) The Xpert Xpress SARS-CoV-2/FLU/RSV assay is intended as an aid in  the diagnosis of influenza from Nasopharyngeal swab specimens and  should not be used as a sole basis for treatment. Nasal washings and  aspirates are unacceptable for Xpert Xpress SARS-CoV-2/FLU/RSV  testing. Fact Sheet for Patients: PinkCheek.be Fact Sheet for Healthcare Providers: GravelBags.it This test is  not yet approved or cleared by the [place] and  has been authorized for detection and/or diagnosis of SARS-CoV-2 by  FDA under an Emergency Use Authorization (EUA). This EUA will remain  in effect (meaning this test can be used) for the duration of the  Covid-19 declaration under Section 564(b)(1) of the Act, 21  U.S.C. section 360bbb-3(b)(1), unless the authorization is  terminated or revoked. Performed at [facility], [place] [address], [place], [place] [address]     Blood Alcohol level:  Lab Results  Component Value Date   ETH <10 06/11/2019   ETH <10 [id number]    Metabolic Disorder Labs:  Lab Results  Component Value Date   HGBA1C 5.2 05/19/2019   MPG 102.54 05/19/2019   MPG 108.28  10/11/2018   Lab Results  Component Value Date   PROLACTIN 5.3 05/19/2019   PROLACTIN 5.2 10/11/2018   Lab Results  Component Value Date   CHOL 172 05/19/2019   TRIG 75 05/19/2019   HDL 45 05/19/2019   CHOLHDL 3.8 05/19/2019   VLDL 15 05/19/2019   LDLCALC 112 (H) 05/19/2019   LDLCALC NOT CALCULATED 10/11/2018    Current Medications: No current facility-administered medications for this encounter.   PTA Medications: Medications Prior to Admission  Medication Sig Dispense Refill Last Dose  . amantadine (SYMMETREL) 100 MG capsule Take 1 capsule (100 mg total) by mouth 2 (two) times daily for 7 days. 60 capsule 1   . [START ON 06/28/2019] ARIPiprazole ER (ABILIFY MAINTENA) 400 MG SRER injection Inject 2 mLs (400 mg total) into the muscle every 28 (twenty-eight) days. 1 each 11   . diphenhydrAMINE (SOMINEX) 25 MG tablet Take 25 mg by mouth at bedtime as needed for sleep.     . insulin detemir (LEVEMIR) 100 UNIT/ML injection Inject 40 Units into the skin 2 (two) times daily.      Marland Kitchen lithium carbonate (ESKALITH) 450 MG CR tablet Take 2 tablets (900 mg total) by mouth at bedtime. 60 tablet 2   . ondansetron (ZOFRAN ODT) 4 MG disintegrating tablet Take 1 tablet (4 mg total) by mouth every 8 (eight) hours as needed for nausea or vomiting. 20 tablet 0   . perphenazine (TRILAFON) 4 MG tablet 2 in a m 3 at h s 150 tablet 3   . propranolol (INDERAL) 10 MG tablet Take 1 tablet (10 mg total) by mouth 2 (two) times daily. 60 tablet 2     Musculoskeletal: Strength & Muscle Tone: within normal limits Gait & Station: normal Patient leans: N/A  Psychiatric Specialty Exam: Physical Exam  Constitutional: She is oriented to person, place, and time. She appears well-developed.  HENT:  Head: Normocephalic.  Eyes: Pupils are equal, round, and reactive to light.  Respiratory: Effort normal.  Musculoskeletal:        General: Normal range of motion.     Cervical back: Normal range of motion.   Neurological: She is alert and oriented to person, place, and time.  Skin: Skin is warm and dry.  Psychiatric: Her speech is normal and behavior is normal. Thought content normal. Her mood appears anxious. Her affect is angry. Cognition and memory are normal. She expresses impulsivity.    Review of Systems  Psychiatric/Behavioral: Positive for behavioral problems, dysphoric mood and hallucinations. Negative for agitation, confusion, decreased concentration and suicidal ideas. The patient is nervous/anxious.   All other systems reviewed and are negative.   There were no vitals taken for this visit.There is no  height or weight on file to calculate BMI.  General Appearance: Casual  Eye Contact:  Minimal  Speech:  Normal Rate  Volume:  Normal  Mood:  Angry, Anxious and Irritable  Affect:  Congruent  Thought Process:  Coherent and Descriptions of Associations: Intact  Orientation:  Full (Time, Place, and Person)  Thought Content:  WDL  Suicidal Thoughts:  No  Homicidal Thoughts:  Denies  Memory:  Recent;   Good  Judgement:  Poor  Insight:  Fair  Psychomotor Activity:  Normal  Concentration:  Concentration: Good  Recall:  Good  Fund of Knowledge:  Good  Language:  Good  Akathisia:  No  Handed:  Right  AIMS (if indicated):     Assets:  Communication Skills Desire for Improvement Financial Resources/Insurance Housing Social Support  ADL's:  Intact  Cognition:  WNL  Sleep:      Disposition: Recommend overnight observation and stabilization. Supportive therapy provided about ongoing stressors.   Treatment Plan Summary: Daily contact with patient to assess and evaluate symptoms and progress in treatment and Medication management  Observation Level/Precautions:  15 minute checks Laboratory:  Chemistry Profile UDS Psychotherapy:   Medications:   Consultations:   Discharge Concerns:   Estimated LOS: Other:      Mliss Fritz, NP 2/26/20215:24 AM

## 2019-06-26 LAB — CERVICOVAGINAL ANCILLARY ONLY
Bacterial vaginitis: NEGATIVE
Candida vaginitis: NEGATIVE
Chlamydia: NEGATIVE
Neisseria Gonorrhea: NEGATIVE
Trichomonas: NEGATIVE

## 2019-06-29 ENCOUNTER — Emergency Department (HOSPITAL_COMMUNITY)
Admission: EM | Admit: 2019-06-29 | Discharge: 2019-06-29 | Disposition: A | Discharge status: Home / Self Care | Payer: Medicaid Other | Attending: Emergency Medicine | Admitting: Emergency Medicine

## 2019-06-29 DIAGNOSIS — N912 Amenorrhea, unspecified: Secondary | ICD-10-CM | POA: Diagnosis not present

## 2019-06-29 DIAGNOSIS — I10 Essential (primary) hypertension: Secondary | ICD-10-CM | POA: Diagnosis not present

## 2019-06-29 DIAGNOSIS — E875 Hyperkalemia: Secondary | ICD-10-CM | POA: Diagnosis present

## 2019-06-29 DIAGNOSIS — F17210 Nicotine dependence, cigarettes, uncomplicated: Secondary | ICD-10-CM | POA: Insufficient documentation

## 2019-06-29 DIAGNOSIS — F250 Schizoaffective disorder, bipolar type: Secondary | ICD-10-CM | POA: Diagnosis not present

## 2019-06-29 DIAGNOSIS — E119 Type 2 diabetes mellitus without complications: Secondary | ICD-10-CM | POA: Insufficient documentation

## 2019-06-29 DIAGNOSIS — Z79899 Other long term (current) drug therapy: Secondary | ICD-10-CM | POA: Diagnosis not present

## 2019-06-29 DIAGNOSIS — Z3202 Encounter for pregnancy test, result negative: Secondary | ICD-10-CM | POA: Diagnosis not present

## 2019-06-29 LAB — COMPREHENSIVE METABOLIC PANEL
ALT: 21 U/L (ref 0–44)
AST: 24 U/L (ref 15–41)
Albumin: 4 g/dL (ref 3.5–5.0)
Alkaline Phosphatase: 44 U/L (ref 38–126)
Anion gap: 11 (ref 5–15)
BUN: 7 mg/dL (ref 6–20)
CO2: 28 mmol/L (ref 22–32)
Calcium: 9.2 mg/dL (ref 8.9–10.3)
Chloride: 104 mmol/L (ref 98–111)
Creatinine, Ser: 0.87 mg/dL (ref 0.44–1.00)
GFR calc Af Amer: >60 mL/min (ref >60)
GFR calc non Af Amer: >60 mL/min (ref >60)
Glucose, Bld: 111 mg/dL — ABNORMAL HIGH (ref 70–99)
Potassium: 3.7 mmol/L (ref 3.5–5.1)
Sodium: 143 mmol/L (ref 135–145)
Total Bilirubin: 0.5 mg/dL (ref 0.3–1.2)
Total Protein: 7.2 g/dL (ref 6.5–8.1)

## 2019-06-29 LAB — CBC
HCT: 39.9 % (ref 36.0–46.0)
Hemoglobin: 12.2 g/dL (ref 12.0–15.0)
MCH: 25.5 pg — ABNORMAL LOW (ref 26.0–34.0)
MCHC: 30.6 g/dL (ref 30.0–36.0)
MCV: 83.5 fL (ref 80.0–100.0)
Platelets: 244 10*3/uL (ref 150–400)
RBC: 4.78 MIL/uL (ref 3.87–5.11)
RDW: 14.7 % (ref 11.5–15.5)
WBC: 11.6 10*3/uL — ABNORMAL HIGH (ref 4.0–10.5)
nRBC: 0 % (ref 0.0–0.2)

## 2019-06-29 LAB — I-STAT BETA HCG BLOOD, ED (MC, WL, AP ONLY): I-stat hCG, quantitative: <5 m[IU]/mL (ref <5)

## 2019-06-29 NOTE — ED Triage Notes (Signed)
Pt here sent by PCP for abnormal lab (potassium) from four days ago. Pt has no complaints other than that she hasn't had a period in a few months.

## 2019-06-29 NOTE — ED Provider Notes (Signed)
[facility] EMERGENCY DEPARTMENT Provider Note   CSN: [id number] Arrival date & time: 06/29/19  1302     History Chief Complaint  Patient presents with  . Abnormal Lab    [name] is a 29 y.o. female.  HPI      29 year old female presents for an abnormal lab.  Per patient she was getting routine blood work when they checked her potassium.  She got a call that her potassium was elevated.  They encouraged her to come to the emergency room for further evaluation.  Patient notes she has not had a period for several months and is unsure if she is pregnant.  She denies any other complaints.  She denies chest pain or shortness of breath, nausea, vomiting, abdominal pain, fevers, chills.  Past Medical History:  Diagnosis Date  . Anxiety   . Bipolar 1 disorder ([place])   . Cognitive deficits   . Depression   . Diabetes mellitus without complication ([place])   . Hypertension   . Mental disorder   . Mental health disorder   . Obesity     Patient Active Problem List   Diagnosis Date Noted  . Bipolar 1 disorder ([place]) 06/23/2019  . MDD (major depressive disorder) 10/10/2018  . Schizoaffective disorder, bipolar type ([place]) 09/25/2018  . Bipolar I disorder ([place]) 06/13/2018  . HTN (hypertension) 05/03/2018  . Tobacco use disorder 05/03/2018  . Bipolar I disorder, most recent episode depressed, severe without psychotic features ([place]) 05/02/2018  . Adjustment disorder with emotional disturbance 01/02/2018  . 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 Moderate bipolar I disorder, most recent episode depressed ([place])   . Psychosis ([place])   . 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 OCD (obsessive compulsive disorder) 10/05/2016  . Major depressive disorder, recurrent episode, mild ([place]) 05/04/2016  . 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 Learning disability 07/18/2015  . Impulse  control disorder 07/18/2015  . Diabetes mellitus ([place]) 07/18/2015  . 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 Hyperlipidemia 07/18/2015  . Severe episode of recurrent major depressive disorder, without psychotic features ([place])   . Suicidal ideation   . Drug overdose   . Cognitive deficits 10/12/2012  . Generalized anxiety disorder 06/28/2012    Past Surgical History:  Procedure Laterality Date  . CESAREAN SECTION    . CESAREAN SECTION N/A 04/25/2013   Procedure: REPEAT CESAREAN SECTION;  Surgeon: [name], MD;  Location: [facility];  Service: Obstetrics;  Laterality: N/A;  . MASS EXCISION N/A 06/03/2012   Procedure: EXCISION MASS;  Surgeon: [name], MD;  Location: [facility];  Service: ENT;  Laterality: N/A;  Excision uvula mass  . TONSILLECTOMY N/A 06/03/2012   Procedure: TONSILLECTOMY;  Surgeon: [name], MD;  Location: [facility];  Service: ENT;  Laterality: N/A;  . TONSILLECTOMY       OB History    Gravida  3   Para  3   Term  3   Preterm  0   AB  0   Living  3     SAB  0   TAB  0   Ectopic  0   Multiple      Live Births  3           Family History  Problem Relation Age of Onset  . Hypertension Mother   . Diabetes Father  Social History   Tobacco Use  . Smoking status: Current Every Day Smoker    Packs/day: 2.00    Years: 11.00    Pack years: 22.00    Types: Cigarettes  . Smokeless tobacco: Never Used  Substance Use Topics  . Alcohol use: Yes    Comment: occ  . Drug use: Never    Comment: Patient denies    Home Medications Prior to Admission medications   Medication Sig Start Date End Date Taking? Authorizing Provider  amantadine (SYMMETREL) 100 MG capsule Take 1 capsule (100 mg total) by mouth 2 (two) times daily for 7 days. 06/12/19 06/19/19  Johnn Hai, MD  ARIPiprazole ER (ABILIFY MAINTENA) 400 MG SRER injection Inject 2 mLs (400 mg total)  into the muscle every 28 (twenty-eight) days. 06/28/19   Johnn Hai, MD  diphenhydrAMINE (SOMINEX) 25 MG tablet Take 25 mg by mouth at bedtime as needed for sleep.    [provider]  insulin detemir (LEVEMIR) 100 UNIT/ML injection Inject 40 Units into the skin 2 (two) times daily.     [provider]  lithium carbonate (ESKALITH) 450 MG CR tablet Take 2 tablets (900 mg total) by mouth at bedtime. 06/02/19   Johnn Hai, MD  ondansetron (ZOFRAN ODT) 4 MG disintegrating tablet Take 1 tablet (4 mg total) by mouth every 8 (eight) hours as needed for nausea or vomiting. 06/21/19   Joneen Caraway, Madelynn Done C, PA-C  perphenazine (TRILAFON) 8 MG tablet 1 tid 06/23/19   Johnn Hai, MD  propranolol (INDERAL) 10 MG tablet Take 1 tablet (10 mg total) by mouth 2 (two) times daily. 06/02/19   Johnn Hai, MD    Allergies    Wellbutrin [bupropion], Omnipaque [iohexol], Penicillins, Cogentin [benztropine], Depakote er [divalproex sodium er], Divalproex sodium, and Iohexol  Review of Systems   Review of Systems  Constitutional: Negative for chills and fever.  Respiratory: Negative for shortness of breath.   Cardiovascular: Negative for chest pain.  Gastrointestinal: Negative for abdominal pain, nausea and vomiting.    Physical Exam Updated Vital Signs BP 126/66 (BP Location: Right Arm)   Pulse 88   Temp 98.6 F (37 C) (Oral)   Resp 18   LMP 05/06/2019 (Exact Date)   SpO2 99%   Physical Exam Vitals and nursing note reviewed.  Constitutional:      Appearance: She is well-developed.  HENT:     Head: Normocephalic and atraumatic.  Eyes:     Conjunctiva/sclera: Conjunctivae normal.  Cardiovascular:     Rate and Rhythm: Normal rate and regular rhythm.     Heart sounds: Normal heart sounds. No murmur.  Pulmonary:     Effort: Pulmonary effort is normal. No respiratory distress.     Breath sounds: Normal breath sounds. No wheezing or rales.  Abdominal:     General: Bowel sounds are normal.  There is no distension.     Palpations: Abdomen is soft.     Tenderness: There is no abdominal tenderness.  Musculoskeletal:        General: No tenderness or deformity. Normal range of motion.     Cervical back: Neck supple.  Skin:    General: Skin is warm and dry.     Findings: No erythema or rash.  Neurological:     Mental Status: She is alert and oriented to person, place, and time.  Psychiatric:        Behavior: Behavior normal.     ED Results / Procedures / Treatments   Labs (  all labs ordered are listed, but only abnormal results are displayed) Labs Reviewed  CBC - Abnormal; Notable for the following components:      Result Value   WBC 11.6 (*)    MCH 25.5 (*)    All other components within normal limits  COMPREHENSIVE METABOLIC PANEL - Abnormal; Notable for the following components:   Glucose, Bld 111 (*)    All other components within normal limits  I-STAT BETA HCG BLOOD, ED ([facility], [facility], [facility] ONLY)    EKG None  Radiology No results found.  Procedures Procedures (including critical care time)  Medications Ordered in ED Medications - No data to display  ED Course  I have reviewed the triage vital signs and the nursing notes.  Pertinent labs & imaging results that were available during my care of the patient were reviewed by me and considered in my medical decision making (see chart for details).    MDM Rules/Calculators/A&P                      Patient presents for concerns of hyperkalemia.  Blood work today is very reassuring.  Her potassium is normal at 3.7.  Her beta hCG is negative.  Likely a false hyperkalemia outpatient.  Patient is asymptomatic, very well-appearing, no acute distress, nontoxic, non-lethargic.  Given reassuring blood work, she has been in stable for discharge.   At this time there does not appear to be any evidence of an acute emergency medical condition and the patient appears stable for discharge with appropriate outpatient follow  up.Diagnosis was discussed with patient who verbalizes understanding and is agreeable to discharge.     Final Clinical Impression(s) / ED Diagnoses Final diagnoses:  Amenorrhea    Rx / DC Orders ED Discharge Orders    None       Rachel Moulds 06/29/19 2221    Tegeler, Gwenyth Allegra, MD 06/30/19 [phone]

## 2019-06-29 NOTE — ED Notes (Signed)
Patient verbalizes understanding of discharge instructions. Opportunity for questioning and answers were provided. Armband removed by staff, pt discharged from ED ambulatory.   

## 2019-06-29 NOTE — Discharge Instructions (Addendum)
Your blood work was reassuring today.  Your potassium level was normal at 3.7.  Your blood work was negative for pregnancy.  Please follow-up with your primary care provider within 1 to 2 weeks for continued evaluation.  Return to the emergency room immediately as needed for new or worsening symptoms or concerns, such as chest pain, shortness of breath, fevers, vomiting or any concerns at all.

## 2019-07-13 ENCOUNTER — Observation Stay (HOSPITAL_COMMUNITY)
Admission: AD | Admit: 2019-07-13 | Discharge: 2019-07-14 | Disposition: A | Discharge status: Home / Self Care | Payer: Medicaid Other | Attending: Psychiatry | Admitting: Psychiatry

## 2019-07-13 DIAGNOSIS — F419 Anxiety disorder, unspecified: Secondary | ICD-10-CM | POA: Insufficient documentation

## 2019-07-13 DIAGNOSIS — E119 Type 2 diabetes mellitus without complications: Secondary | ICD-10-CM | POA: Diagnosis not present

## 2019-07-13 DIAGNOSIS — F319 Bipolar disorder, unspecified: Secondary | ICD-10-CM | POA: Diagnosis present

## 2019-07-13 DIAGNOSIS — F250 Schizoaffective disorder, bipolar type: Secondary | ICD-10-CM | POA: Diagnosis not present

## 2019-07-13 DIAGNOSIS — Z794 Long term (current) use of insulin: Secondary | ICD-10-CM | POA: Diagnosis not present

## 2019-07-13 DIAGNOSIS — Z79899 Other long term (current) drug therapy: Secondary | ICD-10-CM | POA: Diagnosis not present

## 2019-07-13 DIAGNOSIS — Z20822 Contact with and (suspected) exposure to covid-19: Secondary | ICD-10-CM | POA: Insufficient documentation

## 2019-07-13 DIAGNOSIS — E669 Obesity, unspecified: Secondary | ICD-10-CM | POA: Insufficient documentation

## 2019-07-13 DIAGNOSIS — F3130 Bipolar disorder, current episode depressed, mild or moderate severity, unspecified: Secondary | ICD-10-CM | POA: Diagnosis present

## 2019-07-13 DIAGNOSIS — I10 Essential (primary) hypertension: Secondary | ICD-10-CM | POA: Diagnosis not present

## 2019-07-14 ENCOUNTER — Encounter (HOSPITAL_COMMUNITY): Payer: Self-pay | Admitting: Psychiatry

## 2019-07-14 ENCOUNTER — Other Ambulatory Visit: Payer: Self-pay

## 2019-07-14 DIAGNOSIS — F250 Schizoaffective disorder, bipolar type: Secondary | ICD-10-CM | POA: Diagnosis not present

## 2019-07-14 DIAGNOSIS — F319 Bipolar disorder, unspecified: Secondary | ICD-10-CM | POA: Diagnosis not present

## 2019-07-14 LAB — HEPATIC FUNCTION PANEL
ALT: 18 U/L (ref 0–44)
AST: 19 U/L (ref 15–41)
Albumin: 3.8 g/dL (ref 3.5–5.0)
Alkaline Phosphatase: 32 U/L — ABNORMAL LOW (ref 38–126)
Bilirubin, Direct: 0.1 mg/dL (ref 0.0–0.2)
Indirect Bilirubin: 0.5 mg/dL (ref 0.3–0.9)
Total Bilirubin: 0.6 mg/dL (ref 0.3–1.2)
Total Protein: 6.6 g/dL (ref 6.5–8.1)

## 2019-07-14 LAB — COMPREHENSIVE METABOLIC PANEL
ALT: 19 U/L (ref 0–44)
AST: 20 U/L (ref 15–41)
Albumin: 3.7 g/dL (ref 3.5–5.0)
Alkaline Phosphatase: 33 U/L — ABNORMAL LOW (ref 38–126)
Anion gap: 7 (ref 5–15)
BUN: 12 mg/dL (ref 6–20)
CO2: 27 mmol/L (ref 22–32)
Calcium: 9.1 mg/dL (ref 8.9–10.3)
Chloride: 108 mmol/L (ref 98–111)
Creatinine, Ser: 0.74 mg/dL (ref 0.44–1.00)
GFR calc Af Amer: >60 mL/min (ref >60)
GFR calc non Af Amer: >60 mL/min (ref >60)
Glucose, Bld: 102 mg/dL — ABNORMAL HIGH (ref 70–99)
Potassium: 3.7 mmol/L (ref 3.5–5.1)
Sodium: 142 mmol/L (ref 135–145)
Total Bilirubin: 0.7 mg/dL (ref 0.3–1.2)
Total Protein: 6.7 g/dL (ref 6.5–8.1)

## 2019-07-14 LAB — TSH: TSH: 0.959 u[IU]/mL (ref 0.350–4.500)

## 2019-07-14 LAB — LIPID PANEL
Cholesterol: 141 mg/dL (ref 0–200)
HDL: 49 mg/dL (ref >40)
LDL Cholesterol: 77 mg/dL (ref 0–99)
Total CHOL/HDL Ratio: 2.9 RATIO
Triglycerides: 76 mg/dL (ref <150)
VLDL: 15 mg/dL (ref 0–40)

## 2019-07-14 LAB — GLUCOSE, CAPILLARY: Glucose-Capillary: 128 mg/dL — ABNORMAL HIGH (ref 70–99)

## 2019-07-14 LAB — MAGNESIUM: Magnesium: 2.1 mg/dL (ref 1.7–2.4)

## 2019-07-14 LAB — RESPIRATORY PANEL BY RT PCR (FLU A&B, COVID)
Influenza A by PCR: NEGATIVE
Influenza B by PCR: NEGATIVE
SARS Coronavirus 2 by RT PCR: NEGATIVE

## 2019-07-14 LAB — ETHANOL: Alcohol, Ethyl (B): <10 mg/dL (ref <10)

## 2019-07-14 LAB — HEMOGLOBIN A1C
Hgb A1c MFr Bld: 5.9 % — ABNORMAL HIGH (ref 4.8–5.6)
Mean Plasma Glucose: 122.63 mg/dL

## 2019-07-14 MED ORDER — HYDROXYZINE HCL 25 MG PO TABS: 25.0000 mg | ORAL_TABLET | Freq: Three times a day (TID) | ORAL | Status: DC | PRN

## 2019-07-14 MED ORDER — ALUM & MAG HYDROXIDE-SIMETH 200-200-20 MG/5ML PO SUSP: 30.0000 mL | ORAL | Status: DC | PRN

## 2019-07-14 MED ORDER — ACETAMINOPHEN 325 MG PO TABS: 650.0000 mg | ORAL_TABLET | Freq: Four times a day (QID) | ORAL | Status: DC | PRN

## 2019-07-14 MED ORDER — MAGNESIUM HYDROXIDE 400 MG/5ML PO SUSP: 30.0000 mL | Freq: Every day | ORAL | Status: DC | PRN

## 2019-07-14 NOTE — BH Assessment (Signed)
Reform Assessment Progress Note  Per Johnn Hai, MD, this pt does not require psychiatric hospitalization at this time.  Pt is to be discharged from the [facility] Observation Unit with recommendation to continue treatment with the [facility] ACT Team.  This has been included in pt's discharge instructions.  At 10:24 I called [facility].  They report that they are scheduled to see pt today, and agree to come to the Observation Unit around 12:00 to pick pt up.  Pt's nurse, Benjamine Mola, has been notified.  Jalene Mullet, [place] Triage Specialist [phone]
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
## 2019-07-14 NOTE — Discharge Instructions (Signed)
For your behavioral health needs, you are advised to continue treatment with the [facility] ACT Team: ° °     [facility] °     [facility], [address] °     [address] °     [place], [place]  [address] °     [phone] °     Crisis number: [phone] °

## 2019-07-14 NOTE — BH Assessment (Signed)
Assessment Note  [name] is a 29 y.o. female who was brought to [facility] by the police after pt got into an argument with peers at her Transitional Living facility and began yelling, threatening, and throwing items, including chairs. Pt states she has been told that she will have to move out from their program, to which pt is audibly upset, as she was yelling upon arriving to [facility]. Pt was able to be verbally re-directed by [facility] but again became escalated; she was put in a room and was again able to de-escalate. Pt was able to appropriately engage in the [facility] Assessment.  Pt denies SI, though she acknowledges she has a hx of SI and has attempted to kill herself of 4 attempts. Pt denies she has a plan to kill herself at this time. Pt acknowledges she is mad at the man she was seeing at the Transitional Living facility, which is what caused her to become upset this evening, though she states she does not actively want to kill him. Pt denies AVH, NSSIB, engagement with the legal system, and access to guns/weapons. Pt states she has been drinking 1/2 beer several times/week with some of her peers at the Transitional Living facility, but states that, otherwise, she has not been using substances. Pt was able to recollect that her no longer being around these peers, who had convinced her to start drinking, is more than likely a good thing, as they may have later convinced her to start smoking marijuana, and then potentially other drugs, which could have been disastrous for her. Clinician provided pt positive verbal reinforcement for her ability to find a positive in a difficult situation.  Pt's protective factors include a lack of AVH. Pt is open to [facility] services.  Pt denies she has anyone for clinician to contact outside of her ACT Team. Clinician will attempt to make contact with [facility].  Pt is oriented x4. Her recent and remote memory is intact. Pt was cooperative throughout the assessment  process. Pt's insight and judgement is fair; her impulse control is poor.   Diagnosis: F31.9, Bipolar I disorder, Current or most recent episode unspecified   Past Medical History:  Past Medical History:  Diagnosis Date  . Anxiety   . Bipolar 1 disorder ([place])   . Cognitive deficits   . Depression   . Diabetes mellitus without complication ([place])   . Hypertension   . Mental disorder   . Mental health disorder   . Obesity     Past Surgical History:  Procedure Laterality Date  . CESAREAN SECTION    . CESAREAN SECTION N/A 04/25/2013   Procedure: REPEAT CESAREAN SECTION;  Surgeon: [name], MD;  Location: [facility];  Service: Obstetrics;  Laterality: N/A;  . MASS EXCISION N/A 06/03/2012   Procedure: EXCISION MASS;  Surgeon: [name], MD;  Location: [place];  Service: ENT;  Laterality: N/A;  Excision uvula mass  . TONSILLECTOMY N/A 06/03/2012   Procedure: TONSILLECTOMY;  Surgeon: [name], MD;  Location: [place];  Service: ENT;  Laterality: N/A;  . TONSILLECTOMY      Family History:  Family History  Problem Relation Age of Onset  . Hypertension Mother   . Diabetes Father     Social History:  reports that she has been smoking cigarettes. She has a 22.00 pack-year smoking history. She has never used smokeless tobacco. She reports current alcohol use. She reports that she does not use drugs.  Additional  Social History:  Alcohol / Drug Use Pain Medications: Please see MAR Prescriptions: Please see MAR Over the Counter: Please see MAR History of alcohol / drug use?: Yes Longest period of sobriety (when/how long): Unknown Substance #1 Name of Substance 1: EtOH 1 - Age of First Use: Unknown 1 - Amount (size/oz): 1/2 beer 1 - Frequency: Several times/week 1 - Duration: Unknown 1 - Last Use / Amount: 07/13/2019  CIWA: CIWA-Ar BP: 132/69 Pulse Rate: (!) 101 Nausea and Vomiting: no nausea and no vomiting Tactile Disturbances:  none Tremor: no tremor Auditory Disturbances: not present Paroxysmal Sweats: no sweat visible Visual Disturbances: not present Anxiety: six Headache, Fullness in Head: none present Agitation: five Orientation and Clouding of Sensorium: oriented and can do serial additions CIWA-Ar Total: 11 COWS: Clinical Opiate Withdrawal Scale (COWS) Resting Pulse Rate: Pulse Rate 81-100 Sweating: No report of chills or flushing Restlessness: Able to sit still Pupil Size: Pupils pinned or normal size for room light Bone or Joint Aches: Not present Runny Nose or Tearing: Not present GI Upset: No GI symptoms Tremor: No tremor Yawning: No yawning Anxiety or Irritability: Patient obviously irritable/anxious Gooseflesh Skin: Skin is smooth COWS Total Score: 3  Allergies:  Allergies  Allergen Reactions  . Wellbutrin [Bupropion] Shortness Of Breath  . Omnipaque [Iohexol] Swelling and Other (See Comments)    Reaction:  Eye swelling  . Penicillins Hives and Other (See Comments)    Has patient had a PCN reaction causing immediate rash, facial/tongue/throat swelling, SOB or lightheadedness with hypotension: Unknown Has patient had a PCN reaction causing severe rash involving mucus membranes or skin necrosis: Yes Has patient had a PCN reaction that required hospitalization Unknown Has patient had a PCN reaction occurring within the last 10 years: No If all of the above answers are "NO", then may proceed with Cephalosporin use.  Herma Mering [Benztropine]     Make pt feel crazy  . Depakote Er [Divalproex Sodium Er] Nausea And Vomiting  . Divalproex Sodium   . Iohexol     Home Medications:  Medications Prior to Admission  Medication Sig Dispense Refill  . amantadine (SYMMETREL) 100 MG capsule Take 1 capsule (100 mg total) by mouth 2 (two) times daily for 7 days. 60 capsule 1  . ARIPiprazole ER (ABILIFY MAINTENA) 400 MG SRER injection Inject 2 mLs (400 mg total) into the muscle every 28 (twenty-eight)  days. 1 each 11  . diphenhydrAMINE (SOMINEX) 25 MG tablet Take 25 mg by mouth at bedtime as needed for sleep.    . insulin detemir (LEVEMIR) 100 UNIT/ML injection Inject 40 Units into the skin 2 (two) times daily.     Marland Kitchen lithium carbonate (ESKALITH) 450 MG CR tablet Take 2 tablets (900 mg total) by mouth at bedtime. 60 tablet 2  . ondansetron (ZOFRAN ODT) 4 MG disintegrating tablet Take 1 tablet (4 mg total) by mouth every 8 (eight) hours as needed for nausea or vomiting. 20 tablet 0  . perphenazine (TRILAFON) 8 MG tablet 1 tid 90 tablet 1  . propranolol (INDERAL) 10 MG tablet Take 1 tablet (10 mg total) by mouth 2 (two) times daily. 60 tablet 2    OB/GYN Status:  No LMP recorded.  General Assessment Data Location of Assessment: [facility] Assessment Services TTS Assessment: In system Is this a Tele or Face-to-Face Assessment?: Face-to-Face Is this an Initial Assessment or a Re-assessment for this encounter?: Initial Assessment Patient Accompanied by:: N/A Language Other than English: No Living Arrangements: Other (Comment)(Was d/c from  transitional housing tonight; is now homeless) What gender do you identify as?: Female Marital status: Single Pregnancy Status: Unknown Living Arrangements: Alone Can pt return to current living arrangement?: (Was d/c from transitional housing tonight; is now homeless) Admission Status: Voluntary Is patient capable of signing voluntary admission?: Yes Referral Source: Self/Family/Friend Insurance type: [employer] Medicaid  Medical Screening Exam ([place]) Medical Exam completed: Yes  Crisis Care Plan Living Arrangements: Alone Legal Guardian: Other:(Self) Name of Psychiatrist: Natasha Bence, [facility] ACT Team. Name of Therapist: [facility].  Education Status Is patient currently in school?: No Is the patient employed, unemployed or receiving disability?: Receiving disability income  Risk to self  with the past 6 months Suicidal Ideation: No Has patient been a risk to self within the past 6 months prior to admission? : Yes Suicidal Intent: No Has patient had any suicidal intent within the past 6 months prior to admission? : Yes Is patient at risk for suicide?: No Suicidal Plan?: No Has patient had any suicidal plan within the past 6 months prior to admission? : Yes Specify Current Suicidal Plan: None noted Access to Means: No Specify Access to Suicidal Means: None noted What has been your use of drugs/alcohol within the last 12 months?: Pt acknowledges the use of EtOH Previous Attempts/Gestures: Yes How many times?: 4 Other Self Harm Risks: None noted Triggers for Past Attempts: Unknown Intentional Self Injurious Behavior: None Family Suicide History: No Recent stressful life event(s): Conflict (Comment)(Conflict w/ person she was seeing at Transitional Living) Persecutory voices/beliefs?: No Depression: No Depression Symptoms: Tearfulness, Feeling angry/irritable Substance abuse history and/or treatment for substance abuse?: No Suicide prevention information given to non-admitted patients: Not applicable  Risk to Others within the past 6 months Homicidal Ideation: Yes-Currently Present Does patient have any lifetime risk of violence toward others beyond the six months prior to admission? : Yes (comment)(Pt fought her sister, gets angry and yells/throws things) Thoughts of Harm to Others: Yes-Currently Present Comment - Thoughts of Harm to Others: Pt's boyfriend was flirting w/ another woman--pt is angry towards him but does not truly want to kill him Current Homicidal Intent: No Current Homicidal Plan: No Access to Homicidal Means: No Identified Victim: Pt's boyfriend, though she was able to identify that she does not want to kill him History of harm to others?: No Assessment of Violence: On admission Violent Behavior Description: Yelling, throwing items Does patient have  access to weapons?: No(Pt denies access to guns/weapons) Criminal Charges Pending?: No Does patient have a court date: No Is patient on probation?: No  Psychosis Hallucinations: None noted Delusions: None noted  Mental Status Report Appearance/Hygiene: Unremarkable Eye Contact: Good Motor Activity: Unremarkable Speech: Logical/coherent Level of Consciousness: Alert Mood: Sad, Other (Comment)(Upset re: losing her housing) Affect: Appropriate to circumstance Anxiety Level: Moderate Thought Processes: Coherent, Relevant Judgement: Partial Orientation: Person, Place, Time, Situation Obsessive Compulsive Thoughts/Behaviors: Minimal  Cognitive Functioning Concentration: Fair Memory: Recent Intact, Remote Intact Is patient IDD: No Insight: Fair Impulse Control: Poor Appetite: Good Have you had any weight changes? : No Change Sleep: No Change Total Hours of Sleep: 6 Vegetative Symptoms: None  ADLScreening [facility] Assessment Services) Patient's cognitive ability adequate to safely complete daily activities?: Yes Patient able to express need for assistance with ADLs?: Yes Independently performs ADLs?: Yes (appropriate for developmental age)  Prior Inpatient Therapy Prior Inpatient Therapy: Yes Prior Therapy Dates: Per chart, "2021, 2020, 2019 and mult other admissions."  Prior Therapy Facilty/Provider(s): Per chart, "  [facility], [place]."  Reason for Treatment: Per chart, "Bipolar."   Prior Outpatient Therapy Prior Outpatient Therapy: No Does patient have an ACCT team?: Yes Does patient have Intensive In-House Services?  : No Does patient have [facility] services? : No Does patient have P4CC services?: No  ADL Screening (condition at time of admission) Patient's cognitive ability adequate to safely complete daily activities?: Yes Is the patient deaf or have difficulty hearing?: No Does the patient have difficulty seeing, even when wearing glasses/contacts?: No Does the  patient have difficulty concentrating, remembering, or making decisions?: Yes Patient able to express need for assistance with ADLs?: Yes Does the patient have difficulty dressing or bathing?: No Independently performs ADLs?: Yes (appropriate for developmental age) Does the patient have difficulty walking or climbing stairs?: No Weakness of Arms/Hands: None  Home Assistive Devices/Equipment Home Assistive Devices/Equipment: None  Therapy Consults (therapy consults require a physician order) PT Evaluation Needed: No OT Evalulation Needed: No SLP Evaluation Needed: No Abuse/Neglect Assessment (Assessment to be complete while patient is alone) Abuse/Neglect Assessment Can Be Completed: Unable to assess, patient is non-responsive or altered mental status Values / Beliefs Cultural Requests During Hospitalization: None Spiritual Requests During Hospitalization: None Consults Spiritual Care Consult Needed: No Transition of Care Team Consult Needed: No Advance Directives (For Healthcare) Does Patient Have a Medical Advance Directive?: No Would patient like information on creating a medical advance directive?: No - Patient declined          Disposition: Adaku Anike, NP, reviewed pt's chart and information and determined pt should be observed overnight and re-assessed by psychiatry in the morning. Pt will also be provided information re: shelters and housing options in the morning.   Disposition Initial Assessment Completed for this Encounter: Yes Disposition of Patient: (Adaku Anike, NP, determined pt should be observed overnight) Patient refused recommended treatment: No Mode of transportation if patient is discharged/movement?: N/A Patient referred to: Other (Comment)(Pt will be observed overnight for safety and stability)  On Site Evaluation by:   Reviewed with Physician:    Dannielle Burn 07/14/2019 12:49 AM

## 2019-07-14 NOTE — Progress Notes (Signed)
Pt discharged to lobby. Pt was stable and appreciative at that time. All papers and prescriptions were given and valuables returned. Verbal understanding expressed. Denies SI/HI and A/VH. Pt given opportunity to express concerns and ask questions.  

## 2019-07-14 NOTE — H&P (Signed)
[facility] Provider Admission PAA/H&P  Patient Identification: [name] MRN:  [id number] Date of Evaluation:  07/14/2019 Chief Complaint:  Aggressive behavior Principal Diagnosis: Bipolar I disorder ([place]) Diagnosis:  Principal Problem:   Bipolar I disorder ([place])  History of Present Illness:   [name] a 29 y.o who presents voluntarily brought in by [employer] due to behavior problems at the transition house where she resides. Pt reports she was acting out, yelling and throwing chairs because she was upset at the man she was seeing at the facility. Pt reports she was recetly discharged from [facility] 06/22/2019 and have been t [facility] four times in the past 3 months.Pt has been giving many chance for possible behavior change and was told she will get kicked out of the program. Pt denies SI,AVH, and self harming behaviors. Pt endorses HI towards the man she was seeing at the facility.  Pt denies any drug use or alcohol. Pt has an [facility] team through [facility]. Pt states she sleeps 5-6 hours daily, her appetite is good.  During evaluation pt was lying in bed sleepy; she is alert/oriented x 4; cooperative; and mood is anxious congruent with affect. Pt is speaking in a clear tone at moderate volume, and normal pace; with minimal eye contact. Her thought process is coherent and relevant; There is no indication that she is currently responding to internal/external stimuli or experiencing delusional thought content. Pt judgement is poor, insight and impulse control is fair.  Associated Signs/Symptoms: Depression Symptoms:  anxiety, (Hypo) Manic Symptoms:  Impulsivity, Anxiety Symptoms:  Excessive Worry, Psychotic Symptoms:  NA PTSD Symptoms: NA Total Time spent with patient: 30 minutes  Past Psychiatric History: Yes  Is the patient at risk to self? No.  Has the patient been a risk to self in the past 6 months? No.  Has the patient been a risk to self within the distant  past? No.  Is the patient a risk to others? Yes.    Has the patient been a risk to others in the past 6 months? No.  Has the patient been a risk to others within the distant past? No.   Prior Inpatient Therapy: Prior Inpatient Therapy: Yes Prior Therapy Dates: Per chart, "2021, 2020, 2019 and mult other admissions."  Prior Therapy Facilty/Provider(s): Per chart, "[facility], [place]."  Reason for Treatment: Per chart, "Bipolar."  Prior Outpatient Therapy: Prior Outpatient Therapy: No Does patient have an ACCT team?: Yes Does patient have Intensive In-House Services?  : No Does patient have [facility] services? : No Does patient have P4CC services?: No  Alcohol Screening: 1. How often do you have a drink containing alcohol?: 2 to 4 times a month 2. How many drinks containing alcohol do you have on a typical day when you are drinking?: 1 or 2 3. How often do you have six or more drinks on one occasion?: Never AUDIT-C Score: 2 4. How often during the last year have you found that you were not able to stop drinking once you had started?: Less than monthly 5. How often during the last year have you failed to do what was normally expected from you becasue of drinking?: Less than monthly 6. How often during the last year have you needed a first drink in the morning to get yourself going after a heavy drinking session?: Never 7. How often during the last year have you had a feeling of guilt of remorse after drinking?: Never 8. How often during the  last year have you been unable to remember what happened the night before because you had been drinking?: Never 9. Have you or someone else been injured as a result of your drinking?: No 10. Has a relative or friend or a doctor or another health worker been concerned about your drinking or suggested you cut down?: No Alcohol Use Disorder Identification Test Final Score (AUDIT): 4 Alcohol Brief Interventions/Follow-up: AUDIT Score <7 follow-up not  indicated Substance Abuse History in the last 12 months:  Yes.   Consequences of Substance Abuse: Aggressive behavior Previous Psychotropic Medications: Yes  Psychological Evaluations: Yes  Past Medical History:  Past Medical History:  Diagnosis Date  . Anxiety   . Bipolar 1 disorder ([place])   . Cognitive deficits   . Depression   . Diabetes mellitus without complication ([place])   . Hypertension   . Mental disorder   . Mental health disorder   . Obesity     Past Surgical History:  Procedure Laterality Date  . CESAREAN SECTION    . CESAREAN SECTION N/A 04/25/2013   Procedure: REPEAT CESAREAN SECTION;  Surgeon: [name], MD;  Location: [facility];  Service: Obstetrics;  Laterality: N/A;  . MASS EXCISION N/A 06/03/2012   Procedure: EXCISION MASS;  Surgeon: [name], MD;  Location: [facility];  Service: ENT;  Laterality: N/A;  Excision uvula mass  . TONSILLECTOMY N/A 06/03/2012   Procedure: TONSILLECTOMY;  Surgeon: [name], MD;  Location: [facility];  Service: ENT;  Laterality: N/A;  . TONSILLECTOMY     Family History:  Family History  Problem Relation Age of Onset  . Hypertension Mother   . Diabetes Father    Family Psychiatric History: Unknown Tobacco Screening:   Social History:  Social History   Substance and Sexual Activity  Alcohol Use Yes   Comment: occ     Social History   Substance and Sexual Activity  Drug Use Never   Comment: Patient denies    Additional Social History: Marital status: Single    Pain Medications: Please see MAR Prescriptions: Please see MAR Over the Counter: Please see MAR History of alcohol / drug use?: Yes Longest period of sobriety (when/how long): Unknown Name of Substance 1: EtOH 1 - Age of First Use: Unknown 1 - Amount (size/oz): 1/2 beer 1 - Frequency: Several times/week 1 - Duration: Unknown 1 - Last Use / Amount: 07/13/2019                  Allergies:   Allergies   Allergen Reactions  . Wellbutrin [Bupropion] Shortness Of Breath  . Omnipaque [Iohexol] Swelling and Other (See Comments)    Reaction:  Eye swelling  . Penicillins Hives and Other (See Comments)    Has patient had a PCN reaction causing immediate rash, facial/tongue/throat swelling, SOB or lightheadedness with hypotension: Unknown Has patient had a PCN reaction causing severe rash involving mucus membranes or skin necrosis: Yes Has patient had a PCN reaction that required hospitalization Unknown Has patient had a PCN reaction occurring within the last 10 years: No If all of the above answers are "NO", then may proceed with Cephalosporin use.  Herma Mering [Benztropine]     Make pt feel crazy  . Depakote Er [Divalproex Sodium Er] Nausea And Vomiting  . Divalproex Sodium   . Iohexol    Lab Results:  Results for orders placed or performed during the hospital encounter of 07/13/19 (from the past 48 hour(s))  Respiratory Panel  by RT PCR (Flu A&B, Covid) - Nasopharyngeal Swab     Status: None   Collection Time: 07/14/19 12:24 AM   Specimen: Nasopharyngeal Swab  Result Value Ref Range   SARS Coronavirus 2 by RT PCR NEGATIVE NEGATIVE    Comment: (NOTE) SARS-CoV-2 target nucleic acids are NOT DETECTED. The SARS-CoV-2 RNA is generally detectable in upper respiratoy specimens during the acute phase of infection. The lowest concentration of SARS-CoV-2 viral copies this assay can detect is 131 copies/mL. A negative result does not preclude SARS-Cov-2 infection and should not be used as the sole basis for treatment or other patient management decisions. A negative result may occur with  improper specimen collection/handling, submission of specimen other than nasopharyngeal swab, presence of viral mutation(s) within the areas targeted by this assay, and inadequate number of viral copies (<131 copies/mL). A negative result must be combined with clinical observations, patient history, and  epidemiological information. The expected result is Negative. Fact Sheet for Patients:  PinkCheek.be Fact Sheet for Healthcare Providers:  GravelBags.it This test is not yet ap proved or cleared by the [place] FDA and  has been authorized for detection and/or diagnosis of SARS-CoV-2 by FDA under an Emergency Use Authorization (EUA). This EUA will remain  in effect (meaning this test can be used) for the duration of the COVID-19 declaration under Section 564(b)(1) of the Act, 21 U.S.C. section 360bbb-3(b)(1), unless the authorization is terminated or revoked sooner.    Influenza A by PCR NEGATIVE NEGATIVE   Influenza B by PCR NEGATIVE NEGATIVE    Comment: (NOTE) The Xpert Xpress SARS-CoV-2/FLU/RSV assay is intended as an aid in  the diagnosis of influenza from Nasopharyngeal swab specimens and  should not be used as a sole basis for treatment. Nasal washings and  aspirates are unacceptable for Xpert Xpress SARS-CoV-2/FLU/RSV  testing. Fact Sheet for Patients: PinkCheek.be Fact Sheet for Healthcare Providers: GravelBags.it This test is not yet approved or cleared by the [place] FDA and  has been authorized for detection and/or diagnosis of SARS-CoV-2 by  FDA under an Emergency Use Authorization (EUA). This EUA will remain  in effect (meaning this test can be used) for the duration of the  Covid-19 declaration under Section 564(b)(1) of the Act, 21  U.S.C. section 360bbb-3(b)(1), unless the authorization is  terminated or revoked. Performed at [facility], [place] [address], [place], [place] [address]     Blood Alcohol level:  Lab Results  Component Value Date   ETH <10 06/23/2019   ETH <10 [id number]    Metabolic Disorder Labs:  Lab Results  Component Value Date   HGBA1C 5.2 06/23/2019   MPG 102.54 06/23/2019   MPG 102.54  05/19/2019   Lab Results  Component Value Date   PROLACTIN 5.3 05/19/2019   PROLACTIN 5.2 10/11/2018   Lab Results  Component Value Date   CHOL 152 06/23/2019   TRIG 120 06/23/2019   HDL 43 06/23/2019   CHOLHDL 3.5 06/23/2019   VLDL 24 06/23/2019   LDLCALC 85 06/23/2019   LDLCALC 112 (H) 05/19/2019    Current Medications: No current facility-administered medications for this encounter.   PTA Medications: Medications Prior to Admission  Medication Sig Dispense Refill Last Dose  . amantadine (SYMMETREL) 100 MG capsule Take 1 capsule (100 mg total) by mouth 2 (two) times daily for 7 days. 60 capsule 1   . ARIPiprazole ER (ABILIFY MAINTENA) 400 MG SRER injection Inject 2 mLs (400 mg total) into the muscle every  28 (twenty-eight) days. 1 each 11 Unknown at Unknown time  . diphenhydrAMINE (SOMINEX) 25 MG tablet Take 25 mg by mouth at bedtime as needed for sleep.   Unknown at Unknown time  . insulin detemir (LEVEMIR) 100 UNIT/ML injection Inject 40 Units into the skin 2 (two) times daily.    Unknown at Unknown time  . lithium carbonate (ESKALITH) 450 MG CR tablet Take 2 tablets (900 mg total) by mouth at bedtime. 60 tablet 2 Unknown at Unknown time  . ondansetron (ZOFRAN ODT) 4 MG disintegrating tablet Take 1 tablet (4 mg total) by mouth every 8 (eight) hours as needed for nausea or vomiting. 20 tablet 0 Unknown at Unknown time  . perphenazine (TRILAFON) 8 MG tablet 1 tid 90 tablet 1 Unknown at Unknown time  . propranolol (INDERAL) 10 MG tablet Take 1 tablet (10 mg total) by mouth 2 (two) times daily. 60 tablet 2 Unknown at Unknown time    Musculoskeletal: Strength & Muscle Tone: within normal limits Gait & Station: normal Patient leans: N/A  Psychiatric Specialty Exam: Physical Exam  Constitutional: She is oriented to person, place, and time. She appears well-developed and well-nourished.  Eyes: Pupils are equal, round, and reactive to light.  Respiratory: Effort normal.   Musculoskeletal:        General: Normal range of motion.     Cervical back: Normal range of motion.  Neurological: She is alert and oriented to person, place, and time.  Skin: Skin is warm and dry.  Psychiatric: Her speech is normal and behavior is normal. Her mood appears anxious. Cognition and memory are normal. She expresses impulsivity. She expresses homicidal ideation.    Review of Systems  Psychiatric/Behavioral: Positive for behavioral problems. Negative for dysphoric mood, hallucinations, self-injury, sleep disturbance and suicidal ideas. The patient is nervous/anxious.     Blood pressure 132/69, pulse (!) 101, temperature 98.3 F (36.8 C), temperature source Oral, resp. rate 20, SpO2 98 %.There is no height or weight on file to calculate BMI.  General Appearance: Casual  Eye Contact:  Minimal  Speech:  Normal Rate  Volume:  Normal  Mood:  Anxious  Affect:  Congruent  Thought Process:  Coherent and Descriptions of Associations: Intact  Orientation:  Full (Time, Place, and Person)  Thought Content:  Logical  Suicidal Thoughts:  No  Homicidal Thoughts:  Yes.  without intent/plan  Memory:  Recent;   Good  Judgement:  Poor  Insight:  Fair  Psychomotor Activity:  Normal  Concentration:  Concentration: Fair  Recall:  Good  Fund of Knowledge:  Good  Language:  Good  Akathisia:  No  Handed:  Right  AIMS (if indicated):     Assets:  Communication Skills Desire for Improvement Financial Resources/Insurance Housing Social Support  ADL's:  Intact  Cognition:  WNL  Sleep:      Disposition: Recommend overnight observation and stabilization Supportive therapy provided about ongoing stressors.  Treatment Plan Summary: Daily contact with patient to assess and evaluate symptoms and progress in treatment and Medication management  Observation Level/Precautions:  15 minute checks Laboratory:  Chemistry Profile UDS Psychotherapy:   Medications:   Consultations:    Discharge Concerns:   Estimated LOS: Other:      Mliss Fritz, NP 3/19/20211:23 AM

## 2019-07-14 NOTE — Progress Notes (Signed)
Pt. Moved to 402-1 for continued observation status. Pt. Ambulatory with steady gait, no distress noted.  Pt. Calm at present with safety maintained.
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
## 2019-07-14 NOTE — Plan of Care (Signed)
[facility] Crisis Plan  Reason for Crisis Plan:  Crisis Stabilization   Plan of Care:  Referral for Inpatient Hospitalization  Family Support:      Current Living Environment:  Living Arrangements: Alone  Insurance:   Hospital Account    Name Acct ID Class Status Primary Coverage   Iyanna, [id number] [place]        Guarantor Account (for Hospital Account [id number])    Name Relation to Pt Service Area Active? Acct Type   [name] Self [facility] Yes Behavioral Health   Address Phone       [address] [place], [place] [address] [phone])          Coverage Information (for Hospital Account [id number])    F/O Payor/Plan Precert #   [facility] MEDICAID/[place] MEDICAID    Subscriber Subscriber #   [place], [place] [id number]   Address Phone   [address] [place], [place] [address] [phone]      Legal Guardian:  Legal Guardian: Other:(Self)  Primary Care Provider:  Benito Mccreedy, MD  Current Outpatient Providers:  Benito Mccreedy, MD  Psychiatrist:  Name of Psychiatrist: Natasha Bence, [place] ACT Team.  Counselor/Therapist:  Name of Therapist: [facility].  Compliant with Medications:  Yes  Additional Information:   [name] 3/19/20216:26 AM

## 2019-07-25 ENCOUNTER — Ambulatory Visit (HOSPITAL_COMMUNITY)
Admission: AD | Admit: 2019-07-25 | Discharge: 2019-07-25 | Disposition: A | Discharge status: Home / Self Care | Payer: Medicaid Other | Attending: Psychiatry | Admitting: Psychiatry
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
## 2019-07-25 NOTE — BH Assessment (Signed)
Assessment Note  [name] is an 29 y.o. female. who presents voluntary and accompanied to [facility] by [facility]. Patient called [employer] and asked to be taken to [facility]. She told [employer] she was tired of crying . Upon arrival counselor asked the pt, "what brought you to the hospital?" Pt reported, "I need to be put somewhere for a long stay, not in a hospital that only keeps me for a few days". Patient reports walking up and down the street crying and not being able to stop from crying. States,"I need something to keep me still". She doesn't feel that her medications are working. States that she has [facility] as her ACTT provider. They prescribed her mediations but she doesn't feel like they are working. She saw the nurse from the [place] team today and states she received an injection.   Today, patient denies suicidal ideations. No plan. No intent. Pt reported, five previous suicide attempts. Previous suicide attempts were related to depressive symptoms, fights with her boyfriend, and family member. Patient denies self mutilating behaviors. Patient stating that she has increased depression: fatigue, crying spells, and hopelessness. She also has increased anxiety and reports daily panic attacks.   Patient denies current SI but states that has felt homicidal in the past. She felt homicidal toward her father 4 yrs ago. States that she has not since had any thoughts to hurt herself.   Pt  reported, smoking five packs of cigarettes, daily. She denied alcohol and drug use. However, on a previous assessment patient  reported, drinking once Four Loco and three Mickey's (beers) daily. Pt is linked to [facility], for medication management. Patient met with the nurse from the ACTT team and received an injection today.  Per chart, pt has previous inpatient admissions.  Pt presents alert with loud speech. Pt's eye contact was good. Pt's mood was pleasant, anxious. Pt's affect was congruent with mood. Pt's  thought process was coherent, relevant. Pt's judgement was partial. Pt was oriented x4. Pt's concentration normal. Pt's insight and impulse control was fair.   Diagnosis: Bipolar Disorder, Depressive Disorder, Anxiety Disorder  Past Medical History:  Past Medical History:  Diagnosis Date  . Anxiety   . Bipolar 1 disorder ([place])   . Cognitive deficits   . Depression   . Diabetes mellitus without complication ([place])   . Hypertension   . Mental disorder   . Mental health disorder   . Obesity     Past Surgical History:  Procedure Laterality Date  . CESAREAN SECTION    . CESAREAN SECTION N/A 04/25/2013   Procedure: REPEAT CESAREAN SECTION;  Surgeon: [name], MD;  Location: [facility];  Service: Obstetrics;  Laterality: N/A;  . MASS EXCISION N/A 06/03/2012   Procedure: EXCISION MASS;  Surgeon: [name], MD;  Location: [place];  Service: ENT;  Laterality: N/A;  Excision uvula mass  . TONSILLECTOMY N/A 06/03/2012   Procedure: TONSILLECTOMY;  Surgeon: [name], MD;  Location: [place];  Service: ENT;  Laterality: N/A;  . TONSILLECTOMY      Family History:  Family History  Problem Relation Age of Onset  . Hypertension Mother   . Diabetes Father     Social History:  reports that she has been smoking cigarettes. She has a 22.00 pack-year smoking history. She has never used smokeless tobacco. She reports current alcohol use. She reports that she does not use drugs.  Additional Social History:  Alcohol / Drug Use Pain  Medications: Please see MAR Prescriptions: Please see MAR Over the Counter: Please see MAR History of alcohol / drug use?: Yes Substance #1 Name of Substance 1: PT denies use. However, per history she has a used ETOH 1 - Age of First Use: Unknown 1 - Amount (size/oz): 1/2 beer (per history) 1 - Frequency: Several times/week 1 - Duration: Unknown 1 - Last Use / Amount: 07/13/2019 (during a visit on 07/13/19 she reported  use)  CIWA: CIWA-Ar BP: 131/84 Pulse Rate: 97 COWS:    Allergies:  Allergies  Allergen Reactions  . Wellbutrin [Bupropion] Shortness Of Breath  . Omnipaque [Iohexol] Swelling and Other (See Comments)    Reaction:  Eye swelling  . Penicillins Hives and Other (See Comments)    Has patient had a PCN reaction causing immediate rash, facial/tongue/throat swelling, SOB or lightheadedness with hypotension: Unknown Has patient had a PCN reaction causing severe rash involving mucus membranes or skin necrosis: Yes Has patient had a PCN reaction that required hospitalization Unknown Has patient had a PCN reaction occurring within the last 10 years: No If all of the above answers are "NO", then may proceed with Cephalosporin use.  Herma Mering [Benztropine]     Make pt feel crazy  . Depakote Er [Divalproex Sodium Er] Nausea And Vomiting  . Divalproex Sodium   . Iohexol     Home Medications: (Not in a hospital admission)   OB/GYN Status:  No LMP recorded.  General Assessment Data Location of Assessment: [facility] Assessment Services TTS Assessment: In system Is this a Tele or Face-to-Face Assessment?: Face-to-Face Is this an Initial Assessment or a Re-assessment for this encounter?: Initial Assessment Patient Accompanied by:: N/A Language Other than English: No Living Arrangements: Other (Comment)(Was d/c from transitional housing tonight; is now homeless) What gender do you identify as?: Female Marital status: Single Maiden name: (n/a) Pregnancy Status: Unknown Living Arrangements: Alone Can pt return to current living arrangement?: Yes(lives in a hotel ) Admission Status: Voluntary Is patient capable of signing voluntary admission?: Yes Referral Source: Psychiatrist Insurance type: [employer] Medicaid )  Medical Screening Exam ([place]) Medical Exam completed: Kathrene Bongo, NP)  Crisis Care Plan Living Arrangements: Alone Legal Guardian: Other: Name of  Psychiatrist: Natasha Bence, [place] [facility]. Name of Therapist: [facility].  Education Status Is patient currently in school?: No Is the patient employed, unemployed or receiving disability?: Receiving disability income  Risk to self with the past 6 months Suicidal Ideation: No Has patient been a risk to self within the past 6 months prior to admission? : Yes Suicidal Intent: No Has patient had any suicidal intent within the past 6 months prior to admission? : Yes Is patient at risk for suicide?: No Suicidal Plan?: No Has patient had any suicidal plan within the past 6 months prior to admission? : Yes Specify Current Suicidal Plan: (none noted ) Access to Means: No Specify Access to Suicidal Means: (none noted ) What has been your use of drugs/alcohol within the last 12 months?: (denies; per history she has used alchol ) Previous Attempts/Gestures: Yes How many times?: (4 or 5 times ) Other Self Harm Risks: (none noted) Triggers for Past Attempts: Unknown Intentional Self Injurious Behavior: None Comment - Self Injurious Behavior: (n/a) Family Suicide History: No Recent stressful life event(s): (worried about items in hotel if hospitalized at [facility] ) Persecutory voices/beliefs?: No Depression: No Depression Symptoms: Tearfulness Substance abuse history and/or treatment for substance abuse?: No Suicide prevention  information given to non-admitted patients: Not applicable  Risk to Others within the past 6 months Homicidal Ideation: No Does patient have any lifetime risk of violence toward others beyond the six months prior to admission? : Yes (comment)(Pt fought her sister, gets angry and yells/throws things) Thoughts of Harm to Others: No Comment - Thoughts of Harm to Others: (pt reports she wanted to hurt dad 4 yrs ago ) Current Homicidal Intent: No Current Homicidal Plan: No Access to Homicidal Means: No Describe Access to  Homicidal Means: (n/a) Identified Victim: (n/a) History of harm to others?: No Assessment of Violence: On admission Violent Behavior Description: (hx of yelling and throwng items) Does patient have access to weapons?: No Criminal Charges Pending?: No Does patient have a court date: No Is patient on probation?: No  Psychosis Hallucinations: None noted Delusions: None noted  Mental Status Report Appearance/Hygiene: Unremarkable Eye Contact: Good Motor Activity: Unremarkable Speech: Pressured Level of Consciousness: Alert Mood: Anxious, Depressed Affect: Appropriate to circumstance Anxiety Level: Moderate Thought Processes: Coherent, Relevant Judgement: Partial Orientation: Person, Place, Time, Situation Obsessive Compulsive Thoughts/Behaviors: Minimal  Cognitive Functioning Concentration: Fair Memory: Recent Intact, Remote Intact Is patient IDD: No Insight: Fair Impulse Control: Poor Appetite: Good Have you had any weight changes? : No Change Amount of the weight change? (lbs): (20 pounds ) Sleep: No Change Total Hours of Sleep: (6 hrs of sleep per night ) Vegetative Symptoms: None  ADLScreening [facility] Assessment Services) Patient's cognitive ability adequate to safely complete daily activities?: Yes Patient able to express need for assistance with ADLs?: Yes Independently performs ADLs?: Yes (appropriate for developmental age)  Prior Inpatient Therapy Prior Inpatient Therapy: Yes Prior Therapy Dates: Per chart, "2021, 2020, 2019 and mult other admissions."  Prior Therapy Facilty/Provider(s): Per chart, "[facility], [place]."  Reason for Treatment: Per chart, "Bipolar."   Prior Outpatient Therapy Prior Outpatient Therapy: No Prior Therapy Dates: Current. Prior Therapy Facilty/Provider(s): [facility]. Reason for Treatment: Medication management.  Does patient have an ACCT team?: Yes Does patient have Intensive In-House Services?  :  No Does patient have [facility] services? : No Does patient have P4CC services?: No  ADL Screening (condition at time of admission) Patient's cognitive ability adequate to safely complete daily activities?: Yes Is the patient deaf or have difficulty hearing?: No Does the patient have difficulty seeing, even when wearing glasses/contacts?: No Does the patient have difficulty concentrating, remembering, or making decisions?: Yes Patient able to express need for assistance with ADLs?: Yes Does the patient have difficulty dressing or bathing?: No Independently performs ADLs?: Yes (appropriate for developmental age) Does the patient have difficulty walking or climbing stairs?: No Weakness of Legs: None Weakness of Arms/Hands: None  Home Assistive Devices/Equipment Home Assistive Devices/Equipment: None  Therapy Consults (therapy consults require a physician order) PT Evaluation Needed: No OT Evalulation Needed: No SLP Evaluation Needed: No Abuse/Neglect Assessment (Assessment to be complete while patient is alone) Abuse/Neglect Assessment Can Be Completed: Yes Physical Abuse: Denies Verbal Abuse: Denies Sexual Abuse: Denies Exploitation of patient/patient's resources: Denies Self-Neglect: Denies Values / Beliefs Cultural Requests During Hospitalization: None Spiritual Requests During Hospitalization: None Consults Spiritual Care Consult Needed: No Transition of Care Team Consult Needed: No Advance Directives (For Healthcare) Does Patient Have a Medical Advance Directive?: No Would patient like information on creating a medical advance directive?: No - Patient declined          Disposition: Per Tinnie Gens, NP, patient does not meet criteria for inpatient treatment. Patient discharged  to follow up with current provider ([facility]T). Counselor contacted [facility] [employer] [facility]) and he discussed patients visit to [facility] for an assessment. Per [facility] staff this is patient's baseline and  someone from the ACTT team will follow up with her tomorrow for continued support.  Disposition Initial Assessment Completed for this Encounter: Yes Patient referred to: Other (Comment)(current provider; [facility] ACTT services )  On Site Evaluation by:   Reviewed with Physician:    Waldon Merl 07/25/2019 5:37 PM

## 2019-07-29 ENCOUNTER — Other Ambulatory Visit: Payer: Self-pay

## 2019-07-29 ENCOUNTER — Observation Stay (HOSPITAL_COMMUNITY)
Admission: AD | Admit: 2019-07-29 | Discharge: 2019-07-30 | Disposition: A | Discharge status: Home / Self Care | Payer: Medicaid Other | Attending: Psychiatry | Admitting: Psychiatry

## 2019-07-29 ENCOUNTER — Encounter (HOSPITAL_COMMUNITY): Payer: Self-pay | Admitting: Psychiatry

## 2019-07-29 DIAGNOSIS — Z880 Allergy status to penicillin: Secondary | ICD-10-CM | POA: Diagnosis not present

## 2019-07-29 DIAGNOSIS — I10 Essential (primary) hypertension: Secondary | ICD-10-CM | POA: Diagnosis not present

## 2019-07-29 DIAGNOSIS — F314 Bipolar disorder, current episode depressed, severe, without psychotic features: Secondary | ICD-10-CM | POA: Diagnosis present

## 2019-07-29 DIAGNOSIS — Z888 Allergy status to other drugs, medicaments and biological substances status: Secondary | ICD-10-CM | POA: Insufficient documentation

## 2019-07-29 DIAGNOSIS — Z79899 Other long term (current) drug therapy: Secondary | ICD-10-CM | POA: Diagnosis not present

## 2019-07-29 DIAGNOSIS — R45850 Homicidal ideations: Secondary | ICD-10-CM | POA: Diagnosis not present

## 2019-07-29 DIAGNOSIS — F411 Generalized anxiety disorder: Secondary | ICD-10-CM | POA: Diagnosis not present

## 2019-07-29 DIAGNOSIS — Z833 Family history of diabetes mellitus: Secondary | ICD-10-CM | POA: Insufficient documentation

## 2019-07-29 DIAGNOSIS — R45851 Suicidal ideations: Secondary | ICD-10-CM | POA: Diagnosis not present

## 2019-07-29 DIAGNOSIS — E119 Type 2 diabetes mellitus without complications: Secondary | ICD-10-CM | POA: Diagnosis not present

## 2019-07-29 DIAGNOSIS — R4183 Borderline intellectual functioning: Secondary | ICD-10-CM

## 2019-07-29 DIAGNOSIS — E669 Obesity, unspecified: Secondary | ICD-10-CM | POA: Diagnosis not present

## 2019-07-29 DIAGNOSIS — T50901A Poisoning by unspecified drugs, medicaments and biological substances, accidental (unintentional), initial encounter: Secondary | ICD-10-CM | POA: Diagnosis present

## 2019-07-29 DIAGNOSIS — F4325 Adjustment disorder with mixed disturbance of emotions and conduct: Secondary | ICD-10-CM | POA: Diagnosis not present

## 2019-07-29 DIAGNOSIS — F332 Major depressive disorder, recurrent severe without psychotic features: Secondary | ICD-10-CM | POA: Diagnosis present

## 2019-07-29 DIAGNOSIS — N879 Dysplasia of cervix uteri, unspecified: Secondary | ICD-10-CM | POA: Diagnosis present

## 2019-07-29 DIAGNOSIS — Z8249 Family history of ischemic heart disease and other diseases of the circulatory system: Secondary | ICD-10-CM | POA: Diagnosis not present

## 2019-07-29 DIAGNOSIS — Z20822 Contact with and (suspected) exposure to covid-19: Secondary | ICD-10-CM | POA: Insufficient documentation

## 2019-07-29 DIAGNOSIS — F4329 Adjustment disorder with other symptoms: Secondary | ICD-10-CM | POA: Diagnosis present

## 2019-07-29 DIAGNOSIS — F319 Bipolar disorder, unspecified: Secondary | ICD-10-CM | POA: Diagnosis present

## 2019-07-29 DIAGNOSIS — Z794 Long term (current) use of insulin: Secondary | ICD-10-CM | POA: Diagnosis not present

## 2019-07-29 DIAGNOSIS — F17200 Nicotine dependence, unspecified, uncomplicated: Secondary | ICD-10-CM | POA: Diagnosis present

## 2019-07-29 DIAGNOSIS — T50902A Poisoning by unspecified drugs, medicaments and biological substances, intentional self-harm, initial encounter: Secondary | ICD-10-CM | POA: Diagnosis present

## 2019-07-29 DIAGNOSIS — F3130 Bipolar disorder, current episode depressed, mild or moderate severity, unspecified: Secondary | ICD-10-CM | POA: Diagnosis present

## 2019-07-29 DIAGNOSIS — F639 Impulse disorder, unspecified: Secondary | ICD-10-CM | POA: Diagnosis not present

## 2019-07-29 DIAGNOSIS — R4189 Other symptoms and signs involving cognitive functions and awareness: Secondary | ICD-10-CM | POA: Diagnosis present

## 2019-07-29 LAB — RESPIRATORY PANEL BY RT PCR (FLU A&B, COVID)
Influenza A by PCR: NEGATIVE
Influenza B by PCR: NEGATIVE
SARS Coronavirus 2 by RT PCR: NEGATIVE

## 2019-07-29 MED ORDER — DIPHENHYDRAMINE HCL 25 MG PO CAPS: 25.0000 mg | ORAL_CAPSULE | Freq: Every evening | ORAL | Status: DC | PRN

## 2019-07-29 MED ORDER — PROPRANOLOL HCL 10 MG PO TABS
10.0000 mg | ORAL_TABLET | Freq: Two times a day (BID) | ORAL | Status: DC
  Administered 2019-07-30 (×2): 10 mg via ORAL
  Filled 2019-07-29 (×2): qty 1

## 2019-07-29 MED ORDER — LITHIUM CARBONATE ER 450 MG PO TBCR
900.0000 mg | EXTENDED_RELEASE_TABLET | Freq: Every day | ORAL | Status: DC
  Administered 2019-07-30: 900 mg via ORAL
  Filled 2019-07-29: qty 2

## 2019-07-29 MED ORDER — INSULIN DETEMIR 100 UNIT/ML ~~LOC~~ SOLN: 40.0000 [IU] | Freq: Two times a day (BID) | SUBCUTANEOUS | Status: DC

## 2019-07-29 MED ORDER — ONDANSETRON 4 MG PO TBDP: 4.0000 mg | ORAL_TABLET | Freq: Three times a day (TID) | ORAL | Status: DC | PRN

## 2019-07-29 MED ORDER — PERPHENAZINE 2 MG PO TABS
8.0000 mg | ORAL_TABLET | Freq: Three times a day (TID) | ORAL | Status: DC
  Administered 2019-07-30: 8 mg via ORAL
  Filled 2019-07-29: qty 4

## 2019-07-29 MED ORDER — AMANTADINE HCL 100 MG PO CAPS
100.0000 mg | ORAL_CAPSULE | Freq: Two times a day (BID) | ORAL | Status: DC
  Administered 2019-07-30: 100 mg via ORAL
  Filled 2019-07-29 (×6): qty 1

## 2019-07-29 MED ORDER — ARIPIPRAZOLE ER 400 MG IM SRER
400.0000 mg | INTRAMUSCULAR | Status: DC
  Filled 2019-07-29: qty 2

## 2019-07-29 NOTE — H&P (Signed)
[facility] Provider Admission PAA/H&P  Patient Identification: [name] MRN:  [id number] Date of Evaluation:  07/29/2019 Chief Complaint:  Aggressive behavior Principal Diagnosis: <principal problem not specified> Diagnosis:  Active Problems:   Generalized anxiety disorder   Cognitive deficits   Drug overdose   Suicidal ideation   Severe episode of recurrent major depressive disorder, without psychotic features ([place])   Borderline intellectual functioning   Impulse control disorder   Diabetes mellitus ([place])   Cervix dysplasia   Adjustment disorder with mixed disturbance of emotions and conduct   Adjustment disorder with emotional disturbance   Bipolar I disorder, most recent episode depressed, severe without psychotic features ([place])   Tobacco use disorder   Bipolar I disorder ([place])   Bipolar 1 disorder ([place])  History of Present Illness:   [name] a 29 y.o female who presents to [place] unaccompanied, voluntarily after calling law enforcement and asking them to transport her to the hospital. She stated, "I was feeling suicidal, and I have a plan to overdose on my medications or my insulin." The patient disclosed feeling very depressed and anxious. While assessing her, she began to tremors and had a jerking episode stating, "it is my anxiety." She says she is upset because "my family is acting funny towards me. They won't let me come over."  She states, "I'm ready to end my life." The patient was recently discharged from [facility] on 06/22/2019 and has been back five times in three months seeking admission. The patient denies any drug use or alcohol. The patient has an ACT team [facility]. The patient states she does not sleep well, 5-6 hours daily, her appetite is "too" good. During her evaluation, she is alert and oriented x 4, calm, cooperative, and mood-congruent with affect. During the assessment process, the patient appears to be responding to  internal stimuli by jerking and moving oddly. The patient is not presenting with any delusional thinking. The patient denies auditory or visual hallucinations. She voiced that her medications do help her from hearing the voices. The patient admits to suicidal, homicidal ideations. The patient is not presenting with any psychotic or paranoid behaviors. During an encounter with the patient, she was able to answer most questions appropriately. Plan: The patient is a safety risk to herself and others and currently requires psychiatric observation overnight, and she will be reassessed in the morning.  Associated Signs/Symptoms: Depression Symptoms:  anxiety, (Hypo) Manic Symptoms:  Impulsivity, Anxiety Symptoms:  Excessive Worry, Psychotic Symptoms:  NA PTSD Symptoms: NA Total Time spent with patient: 30 minutes  Past Psychiatric History: Yes  Is the patient at risk to self? Yes.    Has the patient been a risk to self in the past 6 months? No.  Has the patient been a risk to self within the distant past? No.  Is the patient a risk to others? No.  Has the patient been a risk to others in the past 6 months? No.  Has the patient been a risk to others within the distant past? No.   Prior Inpatient Therapy: Prior Inpatient Therapy: Yes Prior Therapy Dates: Per chart, "2021, 2020, 2019 and mult other admissions."  Prior Therapy Facilty/Provider(s): Per chart, "[facility], [place]."  Reason for Treatment: Per chart, "Bipolar."  Prior Outpatient Therapy: Prior Outpatient Therapy: Yes Prior Therapy Dates: Current. Prior Therapy Facilty/Provider(s): [facility]. Reason for Treatment: Medication management.  Does patient have an ACCT team?: Yes([facility]) Does patient have Intensive  In-House Services?  : No Does patient have [facility] services? : No Does patient have P4CC services?: No  Alcohol Screening:   Substance Abuse History in the last 12 months:  Yes.   Consequences of  Substance Abuse: Aggressive behavior Previous Psychotropic Medications: Yes  Psychological Evaluations: Yes  Past Medical History:  Past Medical History:  Diagnosis Date  . Anxiety   . Bipolar 1 disorder ([employer])   . Cognitive deficits   . Depression   . Diabetes mellitus without complication ([place])   . Hypertension   . Mental disorder   . Mental health disorder   . Obesity     Past Surgical History:  Procedure Laterality Date  . CESAREAN SECTION    . CESAREAN SECTION N/A 04/25/2013   Procedure: REPEAT CESAREAN SECTION;  Surgeon: [name], MD;  Location: [facility];  Service: Obstetrics;  Laterality: N/A;  . MASS EXCISION N/A 06/03/2012   Procedure: EXCISION MASS;  Surgeon: [name], MD;  Location: [place];  Service: ENT;  Laterality: N/A;  Excision uvula mass  . TONSILLECTOMY N/A 06/03/2012   Procedure: TONSILLECTOMY;  Surgeon: [name], MD;  Location: [place];  Service: ENT;  Laterality: N/A;  . TONSILLECTOMY     Family History:  Family History  Problem Relation Age of Onset  . Hypertension Mother   . Diabetes Father    Family Psychiatric History: Unknown Tobacco Screening:  Yes Social History:  Social History   Substance and Sexual Activity  Alcohol Use Yes   Comment: occ     Social History   Substance and Sexual Activity  Drug Use Never   Comment: Patient denies    Additional Social History: Marital status: Single    Pain Medications: Denies abuse Prescriptions: Denies abuse Over the Counter: Denies abuse History of alcohol / drug use?: No history of alcohol / drug abuse Longest period of sobriety (when/how long): NA                    Allergies:   Allergies  Allergen Reactions  . Wellbutrin [Bupropion] Shortness Of Breath  . Omnipaque [Iohexol] Swelling and Other (See Comments)    Reaction:  Eye swelling  . Penicillins Hives and Other (See Comments)    Has patient had a PCN reaction causing  immediate rash, facial/tongue/throat swelling, SOB or lightheadedness with hypotension: Unknown Has patient had a PCN reaction causing severe rash involving mucus membranes or skin necrosis: Yes Has patient had a PCN reaction that required hospitalization Unknown Has patient had a PCN reaction occurring within the last 10 years: No If all of the above answers are "NO", then may proceed with Cephalosporin use.  Herma Mering [Benztropine]     Make pt feel crazy  . Depakote Er [Divalproex Sodium Er] Nausea And Vomiting  . Divalproex Sodium   . Iohexol    Lab Results:  No results found for this or any previous visit (from the past 48 hour(s)).  Blood Alcohol level:  Lab Results  Component Value Date   ETH <10 07/14/2019   ETH <10 0000000    Metabolic Disorder Labs:  Lab Results  Component Value Date   HGBA1C 5.9 (H) 07/14/2019   MPG 122.63 07/14/2019   MPG 102.54 06/23/2019   Lab Results  Component Value Date   PROLACTIN 5.3 05/19/2019   PROLACTIN 5.2 10/11/2018   Lab Results  Component Value Date   CHOL 141 07/14/2019   TRIG 76 07/14/2019  HDL 49 07/14/2019   CHOLHDL 2.9 07/14/2019   VLDL 15 07/14/2019   LDLCALC 77 07/14/2019   LDLCALC 85 06/23/2019    Current Medications: Current Outpatient Medications  Medication Sig Dispense Refill  . amantadine (SYMMETREL) 100 MG capsule Take 1 capsule (100 mg total) by mouth 2 (two) times daily for 7 days. 60 capsule 1  . ARIPiprazole ER (ABILIFY MAINTENA) 400 MG SRER injection Inject 2 mLs (400 mg total) into the muscle every 28 (twenty-eight) days. 1 each 11  . diphenhydrAMINE (SOMINEX) 25 MG tablet Take 25 mg by mouth at bedtime as needed for sleep.    . insulin detemir (LEVEMIR) 100 UNIT/ML injection Inject 40 Units into the skin 2 (two) times daily.     Marland Kitchen lithium carbonate (ESKALITH) 450 MG CR tablet Take 2 tablets (900 mg total) by mouth at bedtime. 60 tablet 2  . ondansetron (ZOFRAN ODT) 4 MG disintegrating tablet Take 1  tablet (4 mg total) by mouth every 8 (eight) hours as needed for nausea or vomiting. 20 tablet 0  . perphenazine (TRILAFON) 8 MG tablet 1 tid 90 tablet 1  . propranolol (INDERAL) 10 MG tablet Take 1 tablet (10 mg total) by mouth 2 (two) times daily. 60 tablet 2   No current facility-administered medications for this encounter.   PTA Medications: (Not in a hospital admission)   Musculoskeletal: Strength & Muscle Tone: within normal limits Gait & Station: normal Patient leans: N/A  Psychiatric Specialty Exam: Physical Exam  Constitutional: She is oriented to person, place, and time. She appears well-developed and well-nourished.  Eyes: Pupils are equal, round, and reactive to light.  Respiratory: Effort normal.  Musculoskeletal:        General: Normal range of motion.     Cervical back: Normal range of motion.  Neurological: She is alert and oriented to person, place, and time.  Skin: Skin is warm and dry.  Psychiatric: Her speech is normal and behavior is normal. Her mood appears anxious. Cognition and memory are normal. She expresses impulsivity. She expresses homicidal ideation.    Review of Systems  Psychiatric/Behavioral: Positive for behavioral problems. Negative for dysphoric mood, hallucinations, self-injury, sleep disturbance and suicidal ideas. The patient is nervous/anxious.     Blood pressure 127/89, pulse 81, temperature 98.4 F (36.9 C), temperature source Oral, resp. rate 20, SpO2 99 %.There is no height or weight on file to calculate BMI.  General Appearance: Casual  Eye Contact:  Minimal  Speech:  Normal Rate  Volume:  Normal  Mood:  Anxious  Affect:  Congruent  Thought Process:  Coherent and Descriptions of Associations: Intact  Orientation:  Full (Time, Place, and Person)  Thought Content:  Logical  Suicidal Thoughts:  Yes.  with intent/plan  Homicidal Thoughts:  Yes.  without intent/plan  Memory:  Recent;   Good  Judgement:  Poor  Insight:  Lacking   Psychomotor Activity:  Increased  Concentration:  Concentration: Fair  Recall:  Good  Fund of Knowledge:  Fair  Language:  Good  Akathisia:  No  Handed:  Right  AIMS (if indicated):     Assets:  Communication Skills Desire for Improvement Financial Resources/Insurance Housing Social Support  ADL's:  Intact  Cognition:  WNL  Sleep:    Insomnia   Disposition: Recommend overnight observation and stabilization No evidence of imminent risk to self or others at present.   Patient does not meet criteria for psychiatric inpatient admission. Supportive therapy provided about ongoing stressors. The patient will  remain under observation overnight and reassess in the A.M. to determine if she meets criteria for inpatient hospitalization or she can be discharge.  Treatment Plan Summary: Daily contact with patient to assess and evaluate symptoms and progress in treatment and Medication management  Observation Level/Precautions:  15 minute checks Labs order placed Laboratory:  Chemistry Profile UDS Psychotherapy:   Medications:   Consultations:   Discharge Concerns:   Estimated LOS: Other:      Caroline Sauger, NP 4/3/20219:38 PM
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
## 2019-07-30 DIAGNOSIS — F314 Bipolar disorder, current episode depressed, severe, without psychotic features: Secondary | ICD-10-CM | POA: Diagnosis not present

## 2019-07-30 LAB — GLUCOSE, CAPILLARY
Glucose-Capillary: 155 mg/dL — ABNORMAL HIGH (ref 70–99)
Glucose-Capillary: 126 mg/dL — ABNORMAL HIGH (ref 70–99)

## 2019-07-30 NOTE — Plan of Care (Signed)
Pt discharged home to self ambulating without any distress denied HI or SI

## 2019-07-30 NOTE — Progress Notes (Signed)
CSW spoke with Shelton Silvas on pt's ACT Team [phone]) who stated she would be unable to pick patient up at this time and inquired if another transportation option could be provided/offered for pt at this time.  Darletta Moll MSW, [facility] Worker Disposition  [facility] Ph: [phone] Fax: [phone]

## 2019-07-30 NOTE — Progress Notes (Signed)
Patient ID: [name], female   DOB: [birth year], 29 y.o.   MRN: [id number] Patient presents voluntarily complaining of increased depression and anxiety. Reports that she was feeling suicidal prior to admission but currently denying. Patient reports that "I walked down the street all day  Because I was feeling anxious.Marland KitchenMarland KitchenI feel bored because I live alone...". Reports homicidal thoughts toward the man who had been knocking at her door. Patient reports history of Bipolar, Depression and schizoaffective disorders. Denies hallucinations. Patient is alert and oriented and pleasant during assessment. Reports medical history of DM2 and  COPD. Reports history of alcohol use (has not had any alcohol for 4 months), Crack use and cigarette smoking. Patient reports that she smokes about 4 packs a day. Denies history of physical, emotional or sexual abuse. Patient is a mother of 3 children who are currently not in her custody . Patient's mother is the main support system. Reports that the reason for her admission would be "to work on my brain and my anxiety". Patient is admitted to the observation unit to be reevaluated by MD in AM.

## 2019-07-30 NOTE — Progress Notes (Signed)
Discharge Note:  D. Pt alert and oriented x 3. Denies SI and HI at present. No S/S of distress.  Compliant with  medications. Pt assessed by MD. Discharged home as ordered.     A.  Emotional Support offered SI hotline numbers offered to Patient and encouragement provided.  All belongings from locker returned to Patient at the time of discharge. Discharge instructions  reveiwed with Patient including Care Team information. ([facility])  R.  Pt verbalized understanding related  discharge instructions.  Denies concerns at this time  ambulatory with steady gait. Appears to be in no physical distress in time of discharge.

## 2019-07-30 NOTE — [facility] Suicide Risk Assessment (Cosign Needed)
Suicide Risk Assessment  Discharge Assessment   [facility] Discharge Suicide Risk Assessment   Principal Problem: Bipolar I disorder, most recent episode depressed, severe without psychotic features [facility]) Discharge Diagnoses: Principal Problem:   Bipolar I disorder, most recent episode depressed, severe without psychotic features ([place]) Active Problems:   Generalized anxiety disorder   Cognitive deficits   Drug overdose   Suicidal ideation   Severe episode of recurrent major depressive disorder, without psychotic features ([place])   Borderline intellectual functioning   Impulse control disorder   Diabetes mellitus ([place])   Cervix dysplasia   Adjustment disorder with mixed disturbance of emotions and conduct   Adjustment disorder with emotional disturbance   Tobacco use disorder   Bipolar I disorder ([place])   Bipolar 1 disorder ([place])   Total Time spent with patient: 30 minutes  Musculoskeletal: Strength & Muscle Tone: within normal limits Gait & Station: normal Patient leans: did not witness  Psychiatric Specialty Exam:   Blood pressure 127/89, pulse 85, temperature 98.4 F (36.9 C), resp. rate 20, SpO2 99 %.There is no height or weight on file to calculate BMI.  General Appearance: Casual and Neat  Eye Contact::  Good  Speech:  Clear and Coherent and Normal Rate409  Volume:  Normal  Mood:  Euthymic  Affect:  Congruent and Full Range  Thought Process:  Coherent and Descriptions of Associations: Intact  Orientation:  Full (Time, Place, and Person)  Thought Content:  Logical and Hallucinations: Auditory chronic but patient reports improving with medications  Suicidal Thoughts:  No  Homicidal Thoughts:  No  Memory:  Immediate;   Good Recent;   Good Remote;   Good  Judgement:  Good  Insight:  Fair  Psychomotor Activity:  Normal  Concentration:  Good  Recall:  Good  Fund of Knowledge:Good  Language: Good  Akathisia:  Negative  Handed:  Right  AIMS (if indicated):     Assets:   Communication Skills Housing Social Support  Sleep:     Cognition: WNL  ADL's:  Intact   Mental Status Per Nursing Assessment::   On Admission:  NA  Demographic Factors:  Low socioeconomic status  Loss Factors: NA  Historical Factors: Impulsivity and but connected with ACTT  Risk Reduction Factors:   Positive social support  Continued Clinical Symptoms:  Previous Psychiatric Diagnoses and Treatments  Cognitive Features That Contribute To Risk:  None    Suicide Risk:  Mild:  Suicidal ideation of limited frequency, intensity, duration, and specificity.  There are no identifiable plans, no associated intent, mild dysphoria and related symptoms, good self-control (both objective and subjective assessment), few other risk factors, and identifiable protective factors, including available and accessible social support.   Plan Of Care/Follow-up recommendations:  Discharge home.  The Nereida Yarenis Kassebaum was observed overnight in [facility] observation unit for suicidal ideations with a plan to overdose on medications in the context of worsening depression and anxiety.   Chart reviewed and patient seen for face to face assessment with Dr. Parke Poisson. During her overnight stay, she was observed for safety and her home medication regimen was continued as outlined above.  She takes Aripiprazole ER injection 400mg , last dose was 4 days ago, she denies side effects. Since admission, she reports she is less anxious and her depressive thoughts have improved (rated depression and anxiety 2/10 on a scale where 0/10 is no symptoms and 10/10 is severe).    She reports she slept well overnight, ate her breakfast and currently feels she  is "better today than yesterday".  She denies suicidal or homicidal thoughts or plans.  Although on admission she verbalized a suicidal plan and homicidal thoughts towards a man whom she states kept knocking on her door.  The patient states this made her depression and anxiety worse  and she felt she should come to the hospital for evaluation.  She currently lives at the [facility] and states admits she felt overwhelmed at receiving the unwanted attention from the female.  She states she does not feel her safety is a concern if she returns home today as, "I did not let him in my room" and she acknowledges the door as a barrier to unwanted entry. She is known to our facility with similar presentation for both suicidal and homicidal concerns >5 times over the past two months, usually related to a psychosocial stressors and decreased conflict resolution skills.  In the past, she has been able to work through her stressors without incident, rest and staff verbal support. She states she is lonely so appreciates the time and attention she gets from [facility] staff members.   She declines admission at this time and relates she would like to go home to follow-up with her ACTT. She is also aware that she can contact the police if warranted and knows how to access mental health resources, including her ACTT for crisis concerns.    Upon completion of this admission the [place] was both mentally and medically stable for discharge denying suicidal/homicidal ideation; she has chronic audible hallucinations but improving with antipsychotrophic medications. She denies visual/tactile hallucinations, delusional thoughts and paranoia.     Meredith Rumpley, LCSW notified who will reach out to ACTT for follow-up and transportation assistance.    Activity:  as tolerated Diet:  as tolerated Tests:  as tolerated  Mallie Darting, NP 07/30/2019, 10:34 AM

## 2019-08-11 ENCOUNTER — Encounter (HOSPITAL_COMMUNITY): Payer: Self-pay

## 2019-08-11 ENCOUNTER — Emergency Department (HOSPITAL_COMMUNITY)
Admission: EM | Admit: 2019-08-11 | Discharge: 2019-08-11 | Disposition: A | Discharge status: Home / Self Care | Payer: Medicaid Other | Attending: Emergency Medicine | Admitting: Emergency Medicine

## 2019-08-11 ENCOUNTER — Other Ambulatory Visit: Payer: Self-pay

## 2019-08-11 ENCOUNTER — Emergency Department (HOSPITAL_COMMUNITY): Payer: Medicaid Other

## 2019-08-11 DIAGNOSIS — R0602 Shortness of breath: Secondary | ICD-10-CM | POA: Diagnosis present

## 2019-08-11 DIAGNOSIS — Z5321 Procedure and treatment not carried out due to patient leaving prior to being seen by health care provider: Secondary | ICD-10-CM | POA: Insufficient documentation

## 2019-08-11 LAB — GLUCOSE, CAPILLARY: Glucose-Capillary: 193 mg/dL — ABNORMAL HIGH (ref 70–99)

## 2019-08-11 NOTE — ED Notes (Signed)
Pt called 3X for room call. No response.

## 2019-08-11 NOTE — ED Triage Notes (Signed)
Pt sts wakening up shob. Sts congestion and a stuffy nose. Says she is DM II and has not had any insulin in a week.

## 2019-08-16 ENCOUNTER — Other Ambulatory Visit: Payer: Self-pay

## 2019-08-16 ENCOUNTER — Emergency Department (HOSPITAL_COMMUNITY): Payer: Medicaid Other

## 2019-08-16 ENCOUNTER — Emergency Department (HOSPITAL_COMMUNITY)
Admission: EM | Admit: 2019-08-16 | Discharge: 2019-08-17 | Disposition: A | Discharge status: Home / Self Care | Payer: Medicaid Other | Attending: Emergency Medicine | Admitting: Emergency Medicine

## 2019-08-16 DIAGNOSIS — Z79899 Other long term (current) drug therapy: Secondary | ICD-10-CM | POA: Insufficient documentation

## 2019-08-16 DIAGNOSIS — Z794 Long term (current) use of insulin: Secondary | ICD-10-CM | POA: Insufficient documentation

## 2019-08-16 DIAGNOSIS — E119 Type 2 diabetes mellitus without complications: Secondary | ICD-10-CM | POA: Diagnosis not present

## 2019-08-16 DIAGNOSIS — I10 Essential (primary) hypertension: Secondary | ICD-10-CM | POA: Diagnosis not present

## 2019-08-16 DIAGNOSIS — J449 Chronic obstructive pulmonary disease, unspecified: Secondary | ICD-10-CM | POA: Diagnosis not present

## 2019-08-16 DIAGNOSIS — R0789 Other chest pain: Secondary | ICD-10-CM | POA: Insufficient documentation

## 2019-08-16 DIAGNOSIS — F17210 Nicotine dependence, cigarettes, uncomplicated: Secondary | ICD-10-CM | POA: Diagnosis not present

## 2019-08-16 DIAGNOSIS — R079 Chest pain, unspecified: Secondary | ICD-10-CM

## 2019-08-16 LAB — BASIC METABOLIC PANEL
Anion gap: 11 (ref 5–15)
BUN: 7 mg/dL (ref 6–20)
CO2: 27 mmol/L (ref 22–32)
Calcium: 9.4 mg/dL (ref 8.9–10.3)
Chloride: 101 mmol/L (ref 98–111)
Creatinine, Ser: 0.89 mg/dL (ref 0.44–1.00)
GFR calc Af Amer: >60 mL/min (ref >60)
GFR calc non Af Amer: >60 mL/min (ref >60)
Glucose, Bld: 160 mg/dL — ABNORMAL HIGH (ref 70–99)
Potassium: 3 mmol/L — ABNORMAL LOW (ref 3.5–5.1)
Sodium: 139 mmol/L (ref 135–145)

## 2019-08-16 LAB — TROPONIN I (HIGH SENSITIVITY)
Troponin I (High Sensitivity): 5 ng/L (ref <18)
Troponin I (High Sensitivity): 4 ng/L (ref <18)

## 2019-08-16 LAB — CBC
HCT: 40.8 % (ref 36.0–46.0)
Hemoglobin: 12.6 g/dL (ref 12.0–15.0)
MCH: 25.8 pg — ABNORMAL LOW (ref 26.0–34.0)
MCHC: 30.9 g/dL (ref 30.0–36.0)
MCV: 83.4 fL (ref 80.0–100.0)
Platelets: 252 10*3/uL (ref 150–400)
RBC: 4.89 MIL/uL (ref 3.87–5.11)
RDW: 14.3 % (ref 11.5–15.5)
WBC: 13.8 10*3/uL — ABNORMAL HIGH (ref 4.0–10.5)
nRBC: 0 % (ref 0.0–0.2)

## 2019-08-16 LAB — I-STAT BETA HCG BLOOD, ED (MC, WL, AP ONLY): I-stat hCG, quantitative: <5 m[IU]/mL (ref <5)

## 2019-08-16 MED ORDER — CETIRIZINE HCL 10 MG PO TABS: 10.0000 mg | ORAL_TABLET | Freq: Every day | ORAL | 0 refills | Status: DC

## 2019-08-16 MED ORDER — SODIUM CHLORIDE 0.9% FLUSH: 3.0000 mL | Freq: Once | INTRAVENOUS | Status: DC

## 2019-08-16 MED ORDER — ALBUTEROL SULFATE HFA 108 (90 BASE) MCG/ACT IN AERS: 2.0000 | INHALATION_SPRAY | RESPIRATORY_TRACT | Status: DC | PRN

## 2019-08-16 NOTE — ED Provider Notes (Signed)
[facility] EMERGENCY DEPARTMENT Provider Note   CSN: [id number] Arrival date & time: 08/16/19  1832     History Chief Complaint  Patient presents with  . Chest Pain    [name] is a 29 y.o. female.  Patient with past medical history notable for anxiety, bipolar, diabetes, hypertension, schizoaffective disorder, and self-reported COPD presents to the emergency department with a chief complaint of chest tightness/pain.  She states that she has had the pain and shortness of breath over the past few days.  She states that she is concerned it is her COPD.  She does not have an inhaler.  She has not taken anything for her symptoms.  She denies any fevers, chills, or productive cough.  She denies any other associated symptoms.  The history is provided by the patient. No language interpreter was used.       Past Medical History:  Diagnosis Date  . Anxiety   . Bipolar 1 disorder ([place])   . Cognitive deficits   . Depression   . Diabetes mellitus without complication ([place])   . Hypertension   . Mental disorder   . Mental health disorder   . Obesity     Patient Active Problem List   Diagnosis Date Noted  . Bipolar 1 disorder ([place]) 06/23/2019  . MDD (major depressive disorder) 10/10/2018  . Schizoaffective disorder, bipolar type ([place]) 09/25/2018  . Bipolar I disorder ([place]) 06/13/2018  . HTN (hypertension) 05/03/2018  . Tobacco use disorder 05/03/2018  . Bipolar I disorder, most recent episode depressed, severe without psychotic features ([place]) 05/02/2018  . Adjustment disorder with emotional disturbance 01/02/2018  . 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 Moderate bipolar I disorder, most recent episode depressed ([place])   . Psychosis ([place])   . 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 OCD (obsessive compulsive disorder) 10/05/2016  . Major depressive disorder, recurrent  episode, mild ([place]) 05/04/2016  . 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 Learning disability 07/18/2015  . 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 Diabetes mellitus ([place]) 07/18/2015  . 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 Hyperlipidemia 07/18/2015  . Severe episode of recurrent major depressive disorder, without psychotic features ([place])   . Suicidal ideation   . Drug overdose   . Cognitive deficits 10/12/2012  . Generalized anxiety disorder 06/28/2012    Past Surgical History:  Procedure Laterality Date  . CESAREAN SECTION    . CESAREAN SECTION N/A 04/25/2013   Procedure: REPEAT CESAREAN SECTION;  Surgeon: [name], MD;  Location: [facility];  Service: Obstetrics;  Laterality: N/A;  . MASS EXCISION N/A 06/03/2012   Procedure: EXCISION MASS;  Surgeon: [name], MD;  Location: [facility];  Service: ENT;  Laterality: N/A;  Excision uvula mass  . TONSILLECTOMY N/A 06/03/2012   Procedure: TONSILLECTOMY;  Surgeon: [name], MD;  Location: [facility];  Service: ENT;  Laterality: N/A;  . TONSILLECTOMY       OB History    Gravida  3   Para  3   Term  3   Preterm  0   AB  0   Living  3     SAB  0   TAB  0   Ectopic  0   Multiple      Live Births  3           Family History  Problem  Relation Age of Onset  . Hypertension Mother   . Diabetes Father     Social History   Tobacco Use  . Smoking status: Current Every Day Smoker    Packs/day: 4.00    Years: 11.00    Pack years: 44.00    Types: Cigarettes  . Smokeless tobacco: Never Used  . Tobacco comment: Pt declined  Substance Use Topics  . Alcohol use: Not Currently    Comment: occ: last intake 4 mts ago  . Drug use: Not Currently    Types: "Crack" cocaine, Other-see comments    Comment: Patient reports hx of smoking Crack    Home Medications Prior to Admission medications   Medication Sig Start Date  End Date Taking? Authorizing Provider  amantadine (SYMMETREL) 100 MG capsule Take 1 capsule (100 mg total) by mouth 2 (two) times daily for 7 days. 06/12/19 06/19/19  Johnn Hai, MD  ARIPiprazole ER (ABILIFY MAINTENA) 400 MG SRER injection Inject 2 mLs (400 mg total) into the muscle every 28 (twenty-eight) days. 06/28/19   Johnn Hai, MD  cetirizine (ZYRTEC ALLERGY) 10 MG tablet Take 1 tablet (10 mg total) by mouth daily. 08/16/19   Montine Circle, PA-C  diphenhydrAMINE (SOMINEX) 25 MG tablet Take 25 mg by mouth at bedtime as needed for sleep.    [provider]  insulin detemir (LEVEMIR) 100 UNIT/ML injection Inject 40 Units into the skin 2 (two) times daily.     [provider]  lithium carbonate (ESKALITH) 450 MG CR tablet Take 2 tablets (900 mg total) by mouth at bedtime. 06/02/19   Johnn Hai, MD  perphenazine (TRILAFON) 8 MG tablet 1 tid 06/23/19   Johnn Hai, MD  propranolol (INDERAL) 10 MG tablet Take 1 tablet (10 mg total) by mouth 2 (two) times daily. 06/02/19   Johnn Hai, MD    Allergies    Wellbutrin [bupropion], Omnipaque [iohexol], Penicillins, Cogentin [benztropine], Depakote er [divalproex sodium er], Divalproex sodium, and Iohexol  Review of Systems   Review of Systems  All other systems reviewed and are negative.   Physical Exam Updated Vital Signs BP 129/72 (BP Location: Right Arm)   Pulse 86   Temp 98.8 F (37.1 C) (Oral)   Resp 18   Ht 5\' 7"  (1.702 m)   Wt 114.8 kg   LMP 07/27/2019 (Approximate)   SpO2 98%   BMI 39.63 kg/m   Physical Exam Vitals and nursing note reviewed.  Constitutional:      General: She is not in acute distress.    Appearance: She is well-developed.  HENT:     Head: Normocephalic and atraumatic.  Eyes:     Conjunctiva/sclera: Conjunctivae normal.  Cardiovascular:     Rate and Rhythm: Normal rate and regular rhythm.     Heart sounds: No murmur.  Pulmonary:     Effort: Pulmonary effort is normal. No respiratory  distress.     Breath sounds: Normal breath sounds.  Abdominal:     Palpations: Abdomen is soft.     Tenderness: There is no abdominal tenderness.  Musculoskeletal:        General: Normal range of motion.     Cervical back: Neck supple.  Skin:    General: Skin is warm and dry.  Neurological:     Mental Status: She is alert and oriented to person, place, and time.  Psychiatric:        Mood and Affect: Mood normal.        Behavior: Behavior normal.  ED Results / Procedures / Treatments   Labs (all labs ordered are listed, but only abnormal results are displayed) Labs Reviewed  BASIC METABOLIC PANEL - Abnormal; Notable for the following components:      Result Value   Potassium 3.0 (*)    Glucose, Bld 160 (*)    All other components within normal limits  CBC - Abnormal; Notable for the following components:   WBC 13.8 (*)    MCH 25.8 (*)    All other components within normal limits  I-STAT BETA HCG BLOOD, ED ([facility], [facility], [facility] ONLY)  TROPONIN I (HIGH SENSITIVITY)  TROPONIN I (HIGH SENSITIVITY)    EKG None ED ECG REPORT  I personally interpreted this EKG   Date: 08/17/2019   Rate: 81  Rhythm: normal sinus rhythm  QRS Axis: right  Intervals: normal  ST/T Wave abnormalities: normal  Conduction Disutrbances:none  Narrative Interpretation:   Old EKG Reviewed: unchanged   Radiology DG Chest 2 View  Result Date: 08/16/2019 CLINICAL DATA:  Chest pain and shortness of breath. EXAM: CHEST - 2 VIEW COMPARISON:  Radiograph 5 days ago 08/11/2019. FINDINGS: Lung volumes are low.The cardiomediastinal contours are normal. The lungs are clear. Pulmonary vasculature is normal. No consolidation, pleural effusion, or pneumothorax. No acute osseous abnormalities are seen. IMPRESSION: Low lung volumes without acute abnormality. Electronically Signed   By: Keith Rake M.D.   On: 08/16/2019 19:11    Procedures Procedures (including critical care time)  Medications Ordered in  ED Medications  sodium chloride flush (NS) 0.9 % injection 3 mL (has no administration in time range)  albuterol (VENTOLIN HFA) 108 (90 Base) MCG/ACT inhaler 2 puff (has no administration in time range)    ED Course  I have reviewed the triage vital signs and the nursing notes.  Pertinent labs & imaging results that were available during my care of the patient were reviewed by me and considered in my medical decision making (see chart for details).    MDM Rules/Calculators/A&P                      This patient complains of CP and SOB, this involves an extensive number of treatment options, and is a complaint that carries with it a high risk of complications and morbidity.  The differential diagnosis includes bronchospasm, patient reports that she has COPD, though this is not listed on her medical history.  She does smoke approximately 4 packs/day.  Differential also includes PE, though this is thought to be less likely given that she is not tachycardic, she is not tachypneic.  She is in no respiratory distress.  Low risk for ACS given age and reassuring EKG and troponin.  Chest x-ray shows no evidence of pneumonia, no pneumothorax.  Coronary artery vasospasm secondary to cocaine use, this is thought less likely given reassuring EKG and normal troponin.  I ordered, reviewed, and interpreted labs, which included CBC which shows mild leukocytosis of 13.8, she is noted to have chronic mildly elevated white blood cell count.  BMP, which is notable for potassium of 3.0.  This was replaced in the emergency department.  I-STAT hCG, which is negative.  And i-STAT troponin, which was normal.  EKG reviewed and interpreted by me, shows no dysrhythmias or evidence of ischemia. I ordered medication potassium supplement for hypokalemia I ordered imaging studies which included chest x-ray and I independently visualized and interpreted imaging which showed low lung volumes, but no other acute abnormality.  No  evidence of pneumothorax or pneumonia.  Previous records obtained and reviewed numerous prior visits for behavioral health related complaints and prior visits for cocaine use.    After the interventions stated above, I reevaluated the patient and found her stable for discharge with continued outpatient follow-up.  Will give inhaler and treat with zyrtec.   Final Clinical Impression(s) / ED Diagnoses Final diagnoses:  Chest pain, unspecified type    Rx / DC Orders ED Discharge Orders         Ordered    cetirizine (ZYRTEC ALLERGY) 10 MG tablet  Daily     08/16/19 2356           Montine Circle, PA-C 08/17/19 0008    Fatima Blank, MD 08/17/19 2248

## 2019-08-16 NOTE — ED Triage Notes (Signed)
To ED via [facility] from [facility] stay [facility] (homeless ) ACT team member checked on her and she c/o chest pain and increased shortness of breath. Has been smoking 4 packs a day. Hx of schizophrenia

## 2019-08-17 MED ORDER — POTASSIUM CHLORIDE CRYS ER 20 MEQ PO TBCR
40.0000 meq | EXTENDED_RELEASE_TABLET | Freq: Once | ORAL | Status: DC
  Filled 2019-08-17: qty 2

## 2019-08-17 NOTE — ED Notes (Signed)
Patient verbalizes understanding of discharge instructions. Opportunity for questioning and answers were provided. Armband removed by staff, pt discharged from ED ambulatory.   

## 2019-08-21 ENCOUNTER — Ambulatory Visit (HOSPITAL_COMMUNITY)
Admission: AD | Admit: 2019-08-21 | Discharge: 2019-08-21 | Disposition: A | Discharge status: Home / Self Care | Payer: Medicaid Other | Attending: Psychiatry | Admitting: Psychiatry

## 2019-08-21 DIAGNOSIS — I10 Essential (primary) hypertension: Secondary | ICD-10-CM | POA: Insufficient documentation

## 2019-08-21 DIAGNOSIS — R451 Restlessness and agitation: Secondary | ICD-10-CM | POA: Diagnosis present

## 2019-08-21 DIAGNOSIS — Z79899 Other long term (current) drug therapy: Secondary | ICD-10-CM | POA: Insufficient documentation

## 2019-08-21 MED ORDER — PROPRANOLOL HCL 10 MG PO TABS
10.0000 mg | ORAL_TABLET | Freq: Two times a day (BID) | ORAL | Status: DC
  Administered 2019-08-21: 10 mg via ORAL
  Filled 2019-08-21 (×5): qty 1

## 2019-08-21 NOTE — H&P (Signed)
Behavioral Health Medical Screening Exam  [name] is an 29 y.o. female patient presents to [facility] as a walk in with complaints that she needs help because the ACT team is not helping her.  "They said they was going to help me find somewhere to live and its been 3 months.  I don't feel safe where I live now.  I called them today and they told me I don't make enough money and he hung up on me.  They don't help me with nothing."  Patient is also interested in group home or family home.  Called [facility] ACT team states they can pick up patient from hospital.  Also informed of patients interest in group and family home if they are unable to find her somewhere to live.   Patient calmed down when informed that I would call and speak with ACT team.  Patient feels that ACT is not taking her safety seriously related to current living location.  ACT informs that patient has been moved several times and ACT has encouraged patient to find place and ACT will assist with moving  Patient states that she does not feel that she is capable of doing it herself.    Total Time spent with patient: 45 minutes  Psychiatric Specialty Exam: Physical Exam  Vitals reviewed. Constitutional: She is oriented to person, place, and time. She appears well-developed and well-nourished.  Morbid obese   Cardiovascular:  Elevated blood pressure.  Patient reporting she did not take her B/P medication this morning.  Medication ordered  Respiratory: Effort normal.  Musculoskeletal:        General: Normal range of motion.  Neurological: She is alert and oriented to person, place, and time.  Psychiatric: Her speech is normal. Her mood appears anxious. Her affect is angry. She is agitated. She is not actively hallucinating. Thought content is not paranoid and not delusional. Cognition and memory are normal. She expresses impulsivity. She expresses no homicidal and no suicidal ideation.    Review of Systems  Psychiatric/Behavioral:  Positive for agitation. Negative for hallucinations, self-injury, sleep disturbance and suicidal ideas. The patient is nervous/anxious (Stable).        Patient angry with ACT team  All other systems reviewed and are negative.   Blood pressure 125/79, pulse 99, temperature 98.8 F (37.1 C), temperature source Oral, resp. rate 18, last menstrual period 07/27/2019, SpO2 100 %.There is no height or weight on file to calculate BMI.  General Appearance: Casual  Eye Contact:  Good  Speech:  Clear and Coherent and Normal Rate  Volume:  Normal  Mood:  Anxious and Irritable  Affect:  Appropriate and Congruent  Thought Process:  Coherent, Goal Directed and Descriptions of Associations: Intact  Orientation:  Full (Time, Place, and Person)  Thought Content:  WDL  Suicidal Thoughts:  No  Homicidal Thoughts:  No  Memory:  Immediate;   Good  Judgement:  Intact  Insight:  Present  Psychomotor Activity:  Normal  Concentration: Concentration: Good and Attention Span: Good  Recall:  Good  Fund of Knowledge:Good  Language: Good  Akathisia:  No  Handed:  Right  AIMS (if indicated):     Assets:  Communication Skills Desire for Improvement Social Support  Sleep:       Musculoskeletal: Strength & Muscle Tone: within normal limits Gait & Station: normal Patient leans: N/A  Blood pressure 125/79, pulse 99, temperature 98.8 F (37.1 C), temperature source Oral, resp. rate 18, last menstrual period 07/27/2019,  SpO2 100 %.  Recommendations:  Follow up with [facility] ACT team, Group home/Family Care home resources given.    Based on my evaluation the patient does not appear to have an emergency medical condition.  [name], NP 08/21/2019, 1:33 PM

## 2019-08-21 NOTE — BH Assessment (Signed)
Assessment Note  [name] is an 29 y.o. female with a history of Bipolar Disorder who presents as a walk-in to [facility].  She reports having problems with others staying at the Extended stay.  She states some of the men have been approaching her and getting upset when she avoids them.  Patient is followed by [facility] for medication management.  She reports taking medications as prescribed and she acknowledges that the medications "you started me on here keep the voices from being out of control."  She is mostly focused on her current living situation and she is asking for assistance with finding better housing options.  She does appear somewhat irritable, however she is easily redirected.  Patient affirms her safety and states she will follow up with her ACTT staff to discuss seeking a better housing option.    Patient is somewhat irritable, mostly cooperative and behaviorally appropriate.  Patient is appropriately dressed, slightly disheveled.  Speech is loud and somewhat pressured, at baseline..  Eye contact is fair, intense at moments.  Patient's mood is irritable.  Affect is congruent with mood.  Thought process is coherent and relevant.  There is no indication patient is responding to internal stimuli or experiencing delusional thought content.  Judgement and insight are fair.   Diagnosis: Bipolar Disorder  Past Medical History:  Past Medical History:  Diagnosis Date  . Anxiety   . Bipolar 1 disorder ([name])   . Cognitive deficits   . Depression   . Diabetes mellitus without complication ([name])   . Hypertension   . Mental disorder   . Mental health disorder   . Obesity     Past Surgical History:  Procedure Laterality Date  . CESAREAN SECTION    . CESAREAN SECTION N/A 04/25/2013   Procedure: REPEAT CESAREAN SECTION;  Surgeon: Stacy Bellman, MD;  Location: [facility];  Service: Obstetrics;  Laterality: N/A;  . MASS EXCISION N/A 06/03/2012   Procedure: EXCISION  MASS;  Surgeon: Stacy Belfast, MD;  Location: [place];  Service: ENT;  Laterality: N/A;  Excision uvula mass  . TONSILLECTOMY N/A 06/03/2012   Procedure: TONSILLECTOMY;  Surgeon: Stacy Belfast, MD;  Location: [place];  Service: ENT;  Laterality: N/A;  . TONSILLECTOMY      Family History:  Family History  Problem Relation Age of Onset  . Hypertension Mother   . Diabetes Father     Social History:  reports that she has been smoking cigarettes. She has a 44.00 pack-year smoking history. She has never used smokeless tobacco. She reports previous alcohol use. She reports previous drug use. Drugs: "Crack" cocaine and Other-see comments.  Additional Social History:  Alcohol / Drug Use Pain Medications: None Prescriptions: See MAR Over the Counter: See MAR History of alcohol / drug use?: No history of alcohol / drug abuse Longest period of sobriety (when/how long): NA Substance #1 Name of Substance 1: PT denies use. However, per history she has a used ETOH 1 - Age of First Use: Unknown 1 - Amount (size/oz): 1/2 beer (per history) 1 - Duration: Unknown 1 - Last Use / Amount: denies use today, reported use on 07/13/19  CIWA: CIWA-Ar BP: (!) 154/110 Pulse Rate: (!) 102 COWS:    Allergies:  Allergies  Allergen Reactions  . Wellbutrin [Bupropion] Shortness Of Breath  . Omnipaque [Iohexol] Swelling and Other (See Comments)    Reaction:  Eye swelling  . Penicillins Hives and Other (See Comments)  Has patient had a PCN reaction causing immediate rash, facial/tongue/throat swelling, SOB or lightheadedness with hypotension: Unknown Has patient had a PCN reaction causing severe rash involving mucus membranes or skin necrosis: Yes Has patient had a PCN reaction that required hospitalization Unknown Has patient had a PCN reaction occurring within the last 10 years: No If all of the above answers are "NO", then may proceed with Cephalosporin use.  [name] [[name]]     Make pt feel crazy  . Depakote Er [Divalproex Sodium Er] Nausea And Vomiting  . Divalproex Sodium   . Iohexol     Home Medications: (Not in a hospital admission)   OB/GYN Status:  Patient's last menstrual period was 07/27/2019 (approximate).  General Assessment Data Location of Assessment: [facility] Assessment Services TTS Assessment: In system Is this a Tele or Face-to-Face Assessment?: Face-to-Face Is this an Initial Assessment or a Re-assessment for this encounter?: Initial Assessment Patient Accompanied by:: N/A Language Other than English: No Living Arrangements: Other (Comment)(Extended stay) What gender do you identify as?: Female Marital status: Single Maiden name: N/A Pregnancy Status: No Living Arrangements: Alone Can pt return to current living arrangement?: Yes Admission Status: Voluntary Is patient capable of signing voluntary admission?: Yes Referral Source: Self/Family/Friend Insurance type: MCD  Medical Screening Exam ([name]) Medical Exam completed: Yes  Crisis Care Plan Living Arrangements: Alone Legal Guardian: (self) Name of Psychiatrist: Natasha [name], [facility]. Name of Therapist: [facility].  Education Status Is patient currently in school?: No Is the patient employed, unemployed or receiving disability?: Receiving disability income  Risk to self with the past 6 months Suicidal Ideation: No Has patient been a risk to self within the past 6 months prior to admission? : Yes Suicidal Intent: No Has patient had any suicidal intent within the past 6 months prior to admission? : Yes Is patient at risk for suicide?: No Suicidal Plan?: No Has patient had any suicidal plan within the past 6 months prior to admission? : Yes Specify Current Suicidal Plan: No current plan Access to Means: No Specify Access to Suicidal Means: N/A What has been your use of  drugs/alcohol within the last 12 months?: Pt denies recent SA Previous Attempts/Gestures: Yes How many times?: (Multiple - last was "years ago" ) Other Self Harm Risks: interpersonal challenges Triggers for Past Attempts: Unknown Intentional Self Injurious Behavior: None Comment - Self Injurious Behavior: N/A Family Suicide History: No Recent stressful life event(s): (problems with others staying at the extented stay) Persecutory voices/beliefs?: No Depression: Yes Depression Symptoms: Loss of interest in usual pleasures, Feeling angry/irritable Substance abuse history and/or treatment for substance abuse?: No Suicide prevention information given to non-admitted patients: Not applicable  Risk to Others within the past 6 months Homicidal Ideation: No Does patient have any lifetime risk of violence toward others beyond the six months prior to admission? : Yes (comment)(hx of agitation/aggressive behavior ) Thoughts of Harm to Others: No Comment - Thoughts of Harm to Others: irritable, denying HI currently Current Homicidal Intent: No Current Homicidal Plan: No Access to Homicidal Means: No Describe Access to Homicidal Means: N/A Identified Victim: None History of harm to others?: No Assessment of Violence: None Noted Violent Behavior Description: None noted Does patient have access to weapons?: No Criminal Charges Pending?: No Does patient have a court date: No Is patient on probation?: No  Psychosis Hallucinations: None noted Delusions: None noted  Mental Status Report Appearance/Hygiene: Disheveled Eye Contact: Fair Motor  Activity: Restlessness Speech: Loud Level of Consciousness: Alert Mood: Irritable Affect: Irritable Anxiety Level: Minimal Thought Processes: Relevant, Circumstantial Judgement: Partial Orientation: Person, Place, Time Obsessive Compulsive Thoughts/Behaviors: None  Cognitive Functioning Concentration: Decreased Memory: Remote Intact, Recent  Intact Is patient IDD: No Insight: Fair Impulse Control: Poor Appetite: Fair Have you had any weight changes? : No Change Amount of the weight change? (lbs): 0 lbs Sleep: No Change Total Hours of Sleep: (varies) Vegetative Symptoms: None  ADLScreening [facility] Services) Patient's cognitive ability adequate to safely complete daily activities?: Yes Patient able to express need for assistance with ADLs?: Yes Independently performs ADLs?: Yes (appropriate for developmental age)  Prior Inpatient Therapy Prior Inpatient Therapy: Yes Prior Therapy Dates: Per chart mult admissions Prior Therapy Facilty/Provider(s): Per chart, "[facility], [place]."  Reason for Treatment: Bipolar Disorder  Prior Outpatient Therapy Prior Outpatient Therapy: Yes Prior Therapy Dates: Current. Prior Therapy Facilty/Provider(s): [facility]. Reason for Treatment: Medication management.  Does patient have an ACCT team?: Yes Does patient have Intensive In-House Services?  : No Does patient have [facility] services? : No Does patient have P4CC services?: No  ADL Screening (condition at time of admission) Patient's cognitive ability adequate to safely complete daily activities?: Yes Is the patient deaf or have difficulty hearing?: No Does the patient have difficulty seeing, even when wearing glasses/contacts?: No Does the patient have difficulty concentrating, remembering, or making decisions?: No Patient able to express need for assistance with ADLs?: Yes Does the patient have difficulty dressing or bathing?: No Independently performs ADLs?: Yes (appropriate for developmental age) Does the patient have difficulty walking or climbing stairs?: No Weakness of Legs: None Weakness of Arms/Hands: None  Home Assistive Devices/Equipment Home Assistive Devices/Equipment: None  Therapy Consults (therapy consults require a physician order) PT Evaluation Needed: No OT Evalulation  Needed: No SLP Evaluation Needed: No Abuse/Neglect Assessment (Assessment to be complete while patient is alone) Physical Abuse: Denies Verbal Abuse: Denies Sexual Abuse: Denies Exploitation of patient/patient's resources: Denies Self-Neglect: Denies Values / Beliefs Cultural Requests During Hospitalization: None Spiritual Requests During Hospitalization: None Consults Spiritual Care Consult Needed: No Transition of Care Team Consult Needed: No Advance Directives (For Healthcare) Would patient like information on creating a medical advance directive?: No - Patient declined    Disposition: Patient has been cleared psychiatrically for discharge, per [name], NP.  Patient plans to follow up with her current provider, [facility].    Disposition Initial Assessment Completed for this Encounter: Yes Disposition of Patient: Discharge Patient refused recommended treatment: No Mode of transportation if patient is discharged/movement?: Other (comment)([facility] to pick pt up) Patient referred to: (current provider)  On Site Evaluation by:   Reviewed with Physician:  [name], NP  Fransico Meadow 08/21/2019 12:43 PM

## 2019-08-25 ENCOUNTER — Emergency Department (HOSPITAL_COMMUNITY)
Admission: EM | Admit: 2019-08-25 | Discharge: 2019-08-25 | Disposition: A | Discharge status: Home / Self Care | Payer: Medicaid Other | Attending: Emergency Medicine | Admitting: Emergency Medicine

## 2019-08-25 ENCOUNTER — Other Ambulatory Visit: Payer: Self-pay

## 2019-08-25 DIAGNOSIS — E119 Type 2 diabetes mellitus without complications: Secondary | ICD-10-CM | POA: Diagnosis not present

## 2019-08-25 DIAGNOSIS — R531 Weakness: Secondary | ICD-10-CM | POA: Insufficient documentation

## 2019-08-25 DIAGNOSIS — Z794 Long term (current) use of insulin: Secondary | ICD-10-CM | POA: Diagnosis not present

## 2019-08-25 DIAGNOSIS — F17210 Nicotine dependence, cigarettes, uncomplicated: Secondary | ICD-10-CM | POA: Insufficient documentation

## 2019-08-25 DIAGNOSIS — Z79899 Other long term (current) drug therapy: Secondary | ICD-10-CM | POA: Diagnosis not present

## 2019-08-25 DIAGNOSIS — I10 Essential (primary) hypertension: Secondary | ICD-10-CM | POA: Diagnosis not present

## 2019-08-25 DIAGNOSIS — R42 Dizziness and giddiness: Secondary | ICD-10-CM | POA: Diagnosis not present

## 2019-08-25 LAB — URINALYSIS, ROUTINE W REFLEX MICROSCOPIC
Bilirubin Urine: NEGATIVE
Glucose, UA: NEGATIVE mg/dL
Hgb urine dipstick: NEGATIVE
Ketones, ur: NEGATIVE mg/dL
Leukocytes,Ua: NEGATIVE
Nitrite: NEGATIVE
Protein, ur: NEGATIVE mg/dL
Specific Gravity, Urine: 1.024 (ref 1.005–1.030)
pH: 5 (ref 5.0–8.0)

## 2019-08-25 LAB — PREGNANCY, URINE: Preg Test, Ur: NEGATIVE

## 2019-08-25 NOTE — ED Triage Notes (Signed)
Pt BIB EMS from home. Pt c/o fatigue x3 days, pt states its from cigar smoking. VSS. Pt A&Ox4.   CBG 120

## 2019-08-25 NOTE — ED Provider Notes (Signed)
[facility] DEPT Provider Note   CSN: [id number] Arrival date & time: 08/25/19  1554     History Chief Complaint  Patient presents with  . Fatigue    [name] is a 29 y.o. female.  HPI   28yF with fatigue. Onset about 3d ago. She feels like she has no energy. Feels "dizzy" at times. No room spinning sensation. Denies any acute pain. Doesn't think she is pregnant. No urinary complaints. No sick contacts that she is aware of.   Past Medical History:  Diagnosis Date  . Anxiety   . Bipolar 1 disorder ([place])   . Cognitive deficits   . Depression   . Diabetes mellitus without complication ([place])   . Hypertension   . Mental disorder   . Mental health disorder   . Obesity     Patient Active Problem List   Diagnosis Date Noted  . Bipolar 1 disorder ([place]) 06/23/2019  . MDD (major depressive disorder) 10/10/2018  . Schizoaffective disorder, bipolar type ([place]) 09/25/2018  . Bipolar I disorder ([place]) 06/13/2018  . HTN (hypertension) 05/03/2018  . Tobacco use disorder 05/03/2018  . Bipolar I disorder, most recent episode depressed, severe without psychotic features ([place]) 05/02/2018  . Adjustment disorder with emotional disturbance 01/02/2018  . 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 Moderate bipolar I disorder, most recent episode depressed ([place])   . Psychosis ([place])   . 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 OCD (obsessive compulsive disorder) 10/05/2016  . Major depressive disorder, recurrent episode, mild ([place]) 05/04/2016  . 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 Learning disability 07/18/2015  . 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 Diabetes mellitus ([place]) 07/18/2015  . 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 Hyperlipidemia 07/18/2015  . Severe episode of recurrent major depressive disorder, without  psychotic features ([place])   . Suicidal ideation   . Drug overdose   . Cognitive deficits 10/12/2012  . Generalized anxiety disorder 06/28/2012    Past Surgical History:  Procedure Laterality Date  . CESAREAN SECTION    . CESAREAN SECTION N/A 04/25/2013   Procedure: REPEAT CESAREAN SECTION;  Surgeon: [name], MD;  Location: [facility];  Service: Obstetrics;  Laterality: N/A;  . MASS EXCISION N/A 06/03/2012   Procedure: EXCISION MASS;  Surgeon: [name], MD;  Location: [place];  Service: ENT;  Laterality: N/A;  Excision uvula mass  . TONSILLECTOMY N/A 06/03/2012   Procedure: TONSILLECTOMY;  Surgeon: [name], MD;  Location: [place];  Service: ENT;  Laterality: N/A;  . TONSILLECTOMY       OB History    Gravida  3   Para  3   Term  3   Preterm  0   AB  0   Living  3     SAB  0   TAB  0   Ectopic  0   Multiple      Live Births  3           Family History  Problem Relation Age of Onset  . Hypertension Mother   . Diabetes Father     Social History   Tobacco Use  . Smoking status: Current Every Day Smoker    Packs/day: 4.00    Years: 11.00    Pack years: 44.00    Types: Cigarettes  . Smokeless tobacco: Never Used  . Tobacco comment: Pt  declined  Substance Use Topics  . Alcohol use: Not Currently    Comment: occ: last intake 4 mts ago  . Drug use: Not Currently    Types: "Crack" cocaine, Other-see comments    Comment: Patient reports hx of smoking Crack    Home Medications Prior to Admission medications   Medication Sig Start Date End Date Taking? Authorizing Provider  ABILIFY MAINTENA 400 MG PRSY prefilled syringe Inject 400 mg into the muscle every 28 (twenty-eight) days.  07/28/19  Yes [provider]  BANOPHEN 25 MG capsule Take 25 mg by mouth at bedtime. 08/01/19  Yes [provider]  cholecalciferol (VITAMIN D) 25 MCG (1000 UNIT) tablet Take 1,000 Units by mouth daily. 08/21/19  Yes  [provider]  gabapentin (NEURONTIN) 300 MG capsule Take 300 mg by mouth 3 (three) times daily. 08/21/19  Yes [provider]  hydrochlorothiazide (HYDRODIURIL) 25 MG tablet Take 25 mg by mouth daily. 08/21/19  Yes [provider]  lamoTRIgine (LAMICTAL) 100 MG tablet Take 100 mg by mouth daily. 08/21/19  Yes [provider]  LEVEMIR FLEXTOUCH 100 UNIT/ML FlexPen Inject 40 Units into the skin 2 (two) times daily.  08/09/19  Yes [provider]  lithium carbonate (ESKALITH) 450 MG CR tablet Take 2 tablets (900 mg total) by mouth at bedtime. 06/02/19  Yes Johnn Hai, MD  simvastatin (ZOCOR) 20 MG tablet Take 20 mg by mouth at bedtime. 08/21/19  Yes [provider]  amantadine (SYMMETREL) 100 MG capsule Take 1 capsule (100 mg total) by mouth 2 (two) times daily for 7 days. 06/12/19 06/19/19  Johnn Hai, MD  ARIPiprazole ER (ABILIFY MAINTENA) 400 MG SRER injection Inject 2 mLs (400 mg total) into the muscle every 28 (twenty-eight) days. Patient not taking: Reported on 08/25/2019 06/28/19   Johnn Hai, MD  cetirizine (ZYRTEC ALLERGY) 10 MG tablet Take 1 tablet (10 mg total) by mouth daily. Patient not taking: Reported on 08/25/2019 08/16/19   Montine Circle, PA-C  perphenazine (TRILAFON) 8 MG tablet 1 tid Patient not taking: Reported on 08/25/2019 06/23/19   Johnn Hai, MD  propranolol (INDERAL) 10 MG tablet Take 1 tablet (10 mg total) by mouth 2 (two) times daily. Patient not taking: Reported on 08/25/2019 06/02/19   Johnn Hai, MD    Allergies    Wellbutrin [bupropion], Omnipaque [iohexol], Penicillins, Cogentin [benztropine], Depakote er [divalproex sodium er], Divalproex sodium, and Iohexol  Review of Systems   Review of Systems All systems reviewed and negative, other than as noted in HPI.  Physical Exam Updated Vital Signs BP (!) 127/50   Pulse 85   Temp 98.5 F (36.9 C) (Oral)   Resp 18   LMP 07/27/2019 (Approximate)   SpO2 100%    Physical Exam Vitals and nursing note reviewed.  Constitutional:      General: She is not in acute distress.    Appearance: She is well-developed. She is obese. She is not ill-appearing or toxic-appearing.  HENT:     Head: Normocephalic and atraumatic.  Eyes:     General:        Right eye: No discharge.        Left eye: No discharge.     Conjunctiva/sclera: Conjunctivae normal.  Cardiovascular:     Rate and Rhythm: Normal rate and regular rhythm.     Heart sounds: Normal heart sounds. No murmur. No friction rub. No gallop.   Pulmonary:     Effort: Pulmonary effort is normal. No respiratory distress.  Breath sounds: Normal breath sounds.  Abdominal:     General: There is no distension.     Palpations: Abdomen is soft.     Tenderness: There is no abdominal tenderness.  Musculoskeletal:        General: No tenderness.     Cervical back: Neck supple.  Skin:    General: Skin is warm and dry.  Neurological:     Mental Status: She is alert.  Psychiatric:        Behavior: Behavior normal.        Thought Content: Thought content normal.     ED Results / Procedures / Treatments   Labs (all labs ordered are listed, but only abnormal results are displayed) Labs Reviewed  URINALYSIS, ROUTINE W REFLEX MICROSCOPIC  PREGNANCY, URINE    EKG EKG Interpretation  Date/Time:  Friday August 25 2019 18:55:41 EDT Ventricular Rate:  90 PR Interval:    QRS Duration: 99 QT Interval:  389 QTC Calculation: 476 R Axis:   99 Text Interpretation: Sinus rhythm Borderline right axis deviation Confirmed by Virgel Manifold [phone]) on 08/25/2019 7:01:57 PM   Radiology No results found.  Procedures Procedures (including critical care time)  Medications Ordered in ED Medications - No data to display  ED Course  I have reviewed the triage vital signs and the nursing notes.  Pertinent labs & imaging results that were available during my care of the patient were reviewed by me and  considered in my medical decision making (see chart for details).    MDM Rules/Calculators/A&P                      28yF with fatigue. Afberile. HD stable. Recent labs reviewed. NOt anemic. Recent TSH normal. Symptoms vague and exam is nonfocal. I don't think there is much utility in obtaining additional blood work at this time. FU with PCP if symptoms persist.  Final Clinical Impression(s) / ED Diagnoses Final diagnoses:  Generalized weakness    Rx / DC Orders ED Discharge Orders    None       Virgel Manifold, MD 08/25/19 1906

## 2019-09-12 ENCOUNTER — Encounter (HOSPITAL_COMMUNITY): Payer: Self-pay | Admitting: Psychiatry

## 2019-09-12 ENCOUNTER — Observation Stay (HOSPITAL_COMMUNITY)
Admission: AD | Admit: 2019-09-12 | Discharge: 2019-09-13 | Disposition: A | Discharge status: Home / Self Care | Payer: Medicaid Other | Attending: Psychiatry | Admitting: Psychiatry

## 2019-09-12 ENCOUNTER — Other Ambulatory Visit: Payer: Self-pay

## 2019-09-12 DIAGNOSIS — Z833 Family history of diabetes mellitus: Secondary | ICD-10-CM | POA: Insufficient documentation

## 2019-09-12 DIAGNOSIS — F314 Bipolar disorder, current episode depressed, severe, without psychotic features: Secondary | ICD-10-CM | POA: Diagnosis not present

## 2019-09-12 DIAGNOSIS — F319 Bipolar disorder, unspecified: Secondary | ICD-10-CM | POA: Insufficient documentation

## 2019-09-12 DIAGNOSIS — Z794 Long term (current) use of insulin: Secondary | ICD-10-CM | POA: Diagnosis not present

## 2019-09-12 DIAGNOSIS — Z8249 Family history of ischemic heart disease and other diseases of the circulatory system: Secondary | ICD-10-CM | POA: Insufficient documentation

## 2019-09-12 DIAGNOSIS — Z888 Allergy status to other drugs, medicaments and biological substances status: Secondary | ICD-10-CM | POA: Diagnosis not present

## 2019-09-12 DIAGNOSIS — Z79899 Other long term (current) drug therapy: Secondary | ICD-10-CM | POA: Diagnosis not present

## 2019-09-12 DIAGNOSIS — Z20822 Contact with and (suspected) exposure to covid-19: Secondary | ICD-10-CM | POA: Diagnosis not present

## 2019-09-12 DIAGNOSIS — F17210 Nicotine dependence, cigarettes, uncomplicated: Secondary | ICD-10-CM | POA: Insufficient documentation

## 2019-09-12 DIAGNOSIS — E119 Type 2 diabetes mellitus without complications: Secondary | ICD-10-CM | POA: Diagnosis not present

## 2019-09-12 DIAGNOSIS — Z6839 Body mass index (BMI) 39.0-39.9, adult: Secondary | ICD-10-CM | POA: Insufficient documentation

## 2019-09-12 DIAGNOSIS — R45851 Suicidal ideations: Secondary | ICD-10-CM | POA: Insufficient documentation

## 2019-09-12 DIAGNOSIS — F209 Schizophrenia, unspecified: Secondary | ICD-10-CM | POA: Diagnosis present

## 2019-09-12 DIAGNOSIS — Z880 Allergy status to penicillin: Secondary | ICD-10-CM | POA: Insufficient documentation

## 2019-09-12 DIAGNOSIS — E669 Obesity, unspecified: Secondary | ICD-10-CM | POA: Diagnosis not present

## 2019-09-12 DIAGNOSIS — I10 Essential (primary) hypertension: Secondary | ICD-10-CM | POA: Diagnosis not present

## 2019-09-12 LAB — SARS CORONAVIRUS 2 BY RT PCR (HOSPITAL ORDER, PERFORMED IN ~~LOC~~ HOSPITAL LAB): SARS Coronavirus 2: NEGATIVE

## 2019-09-12 NOTE — H&P (Signed)
[facility] Provider Admission PAA/H&P  Patient Identification: Martrice Jablonowski MRN:  [id number] Date of Evaluation:  09/12/2019 Chief Complaint:  Suicidal ideation Principal Diagnosis: <principal problem not specified> Diagnosis:  Active Problems:   Bipolar I disorder, most recent episode depressed, severe without psychotic features ([place])  History of Present Illness:   Mahasin Sarie Bynoe is a 29 y.o. female who presents voluntarily to [facility] for [place]. Pt reports she has worsening anxiety and depression due to her ACT Team telling her lies about her possible placement in a group home at [place]. Pt reports she currently stays in a motel till Friday and is worried about her placement due to random people knocking at her door where she currently stays. Pt endorses SI with a plan to take pill and has multiple history of SA. Pt denies HI, AVH, self harm or paranoia. Pt is [facility]ACT Team for medication management and counseling. She denies any drug use or alcohol. Pt has had multiple assessments at [facility] 1/21, 2/1,2/14,2/26,3/18 and 4/03 for similar presentation.   During evaluation pt is sitting; she is alert/oriented x 4; calm/cooperative; and mood is depressed/anxious congruent with affect. Patient is speaking in a clear tone at moderate volume, and normal pace; with fair eye contact. Her thought process is coherent and relevant; There is no indication that she is currently responding to internal/external stimuli or experiencing delusional thought content. Patient denies self-harm/homicidal ideation, psychosis, and paranoia. Pt's insight, judgement and impulse control is fair.   Associated Signs/Symptoms: Depression Symptoms:  depressed mood, suicidal thoughts with specific plan, (Hypo) Manic Symptoms:  Irritable Mood, Anxiety Symptoms:  Excessive Worry, Psychotic Symptoms:  Denies PTSD Symptoms: NA Total Time spent with patient: 30 minutes  Past Psychiatric  History: Yes  Is the patient at risk to self? Yes.    Has the patient been a risk to self in the past 6 months? No.  Has the patient been a risk to self within the distant past? Yes.    Is the patient a risk to others? No.  Has the patient been a risk to others in the past 6 months? No.  Has the patient been a risk to others within the distant past? No.   Prior Inpatient Therapy: Prior Inpatient Therapy: Yes Prior Therapy Dates: Per chart, "multiple admissions."  Prior Therapy Facilty/Provider(s): Per chart, "[facility], [place]."  Reason for Treatment: Bipolar Disorder Prior Outpatient Therapy: Prior Outpatient Therapy: Yes Prior Therapy Dates: Current. Prior Therapy Facilty/Provider(s): [facility]. Reason for Treatment: Medication management.  Does patient have an ACCT team?: Yes Does patient have Intensive In-House Services?  : No Does patient have [facility] services? : No Does patient have P4CC services?: No  Alcohol Screening:   Substance Abuse History in the last 12 months:  No. Consequences of Substance Abuse: NA Previous Psychotropic Medications: Yes  Psychological Evaluations: Yes  Past Medical History:  Past Medical History:  Diagnosis Date  . Anxiety   . Bipolar 1 disorder ([place])   . Cognitive deficits   . Depression   . Diabetes mellitus without complication ([place])   . Hypertension   . Mental disorder   . Mental health disorder   . Obesity     Past Surgical History:  Procedure Laterality Date  . CESAREAN SECTION    . CESAREAN SECTION N/A 04/25/2013   Procedure: REPEAT CESAREAN SECTION;  Surgeon: [name], MD;  Location: [facility];  Service: Obstetrics;  Laterality: N/A;  . MASS  EXCISION N/A 06/03/2012   Procedure: EXCISION MASS;  Surgeon: [name], MD;  Location: [place];  Service: ENT;  Laterality: N/A;  Excision uvula mass  . TONSILLECTOMY N/A 06/03/2012   Procedure: TONSILLECTOMY;  Surgeon: [name], MD;  Location: [place];  Service: ENT;  Laterality: N/A;  . TONSILLECTOMY     Family History:  Family History  Problem Relation Age of Onset  . Hypertension Mother   . Diabetes Father    Family Psychiatric History: Unknown Tobacco Screening:   Social History:  Social History   Substance and Sexual Activity  Alcohol Use Not Currently   Comment: occ: last intake 4 mts ago     Social History   Substance and Sexual Activity  Drug Use Not Currently  . Types: "Crack" cocaine, Other-see comments   Comment: Patient reports hx of smoking Crack    Additional Social History: Marital status: Single    Pain Medications: See MAR Prescriptions: See MAR Over the Counter: See MAR History of alcohol / drug use?: No history of alcohol / drug abuse                    Allergies:   Allergies  Allergen Reactions  . Wellbutrin [Bupropion] Shortness Of Breath  . Omnipaque [Iohexol] Swelling and Other (See Comments)    Reaction:  Eye swelling  . Penicillins Hives and Other (See Comments)    Has patient had a PCN reaction causing immediate rash, facial/tongue/throat swelling, SOB or lightheadedness with hypotension: Unknown Has patient had a PCN reaction causing severe rash involving mucus membranes or skin necrosis: Yes Has patient had a PCN reaction that required hospitalization Unknown Has patient had a PCN reaction occurring within the last 10 years: No If all of the above answers are "NO", then may proceed with Cephalosporin use.  Herma Mering [Benztropine]     Make pt feel crazy  . Depakote Er [Divalproex Sodium Er] Nausea And Vomiting  . Divalproex Sodium   . Iohexol    Lab Results: No results found for this or any previous visit (from the past 48 hour(s)).  Blood Alcohol level:  Lab Results  Component Value Date   ETH <10 07/14/2019   ETH <10 0000000    Metabolic Disorder Labs:  Lab Results  Component Value Date   HGBA1C 5.9 (H)  07/14/2019   MPG 122.63 07/14/2019   MPG 102.54 06/23/2019   Lab Results  Component Value Date   PROLACTIN 5.3 05/19/2019   PROLACTIN 5.2 10/11/2018   Lab Results  Component Value Date   CHOL 141 07/14/2019   TRIG 76 07/14/2019   HDL 49 07/14/2019   CHOLHDL 2.9 07/14/2019   VLDL 15 07/14/2019   LDLCALC 77 07/14/2019   LDLCALC 85 06/23/2019    Current Medications: No current facility-administered medications for this encounter.   PTA Medications: Medications Prior to Admission  Medication Sig Dispense Refill Last Dose  . ABILIFY MAINTENA 400 MG PRSY prefilled syringe Inject 400 mg into the muscle every 28 (twenty-eight) days.      Marland Kitchen amantadine (SYMMETREL) 100 MG capsule Take 1 capsule (100 mg total) by mouth 2 (two) times daily for 7 days. 60 capsule 1   . ARIPiprazole ER (ABILIFY MAINTENA) 400 MG SRER injection Inject 2 mLs (400 mg total) into the muscle every 28 (twenty-eight) days. (Patient not taking: Reported on 08/25/2019) 1 each 11   . BANOPHEN 25 MG capsule Take 25 mg by mouth  at bedtime.     . cetirizine (ZYRTEC ALLERGY) 10 MG tablet Take 1 tablet (10 mg total) by mouth daily. (Patient not taking: Reported on 08/25/2019) 30 tablet 0   . cholecalciferol (VITAMIN D) 25 MCG (1000 UNIT) tablet Take 1,000 Units by mouth daily.     Marland Kitchen gabapentin (NEURONTIN) 300 MG capsule Take 300 mg by mouth 3 (three) times daily.     . hydrochlorothiazide (HYDRODIURIL) 25 MG tablet Take 25 mg by mouth daily.     Marland Kitchen lamoTRIgine (LAMICTAL) 100 MG tablet Take 100 mg by mouth daily.     Marland Kitchen LEVEMIR FLEXTOUCH 100 UNIT/ML FlexPen Inject 40 Units into the skin 2 (two) times daily.      Marland Kitchen lithium carbonate (ESKALITH) 450 MG CR tablet Take 2 tablets (900 mg total) by mouth at bedtime. 60 tablet 2   . perphenazine (TRILAFON) 8 MG tablet 1 tid (Patient not taking: Reported on 08/25/2019) 90 tablet 1   . propranolol (INDERAL) 10 MG tablet Take 1 tablet (10 mg total) by mouth 2 (two) times daily. (Patient not  taking: Reported on 08/25/2019) 60 tablet 2   . simvastatin (ZOCOR) 20 MG tablet Take 20 mg by mouth at bedtime.       Musculoskeletal: Strength & Muscle Tone: within normal limits Gait & Station: normal Patient leans: N/A  Psychiatric Specialty Exam: Physical Exam  Constitutional: She is oriented to person, place, and time. She appears well-developed and well-nourished.  HENT:  Head: Normocephalic.  Eyes: Pupils are equal, round, and reactive to light.  Respiratory: Effort normal.  Musculoskeletal:        General: Normal range of motion.     Cervical back: Normal range of motion.  Neurological: She is alert and oriented to person, place, and time.  Skin: Skin is warm and dry.  Psychiatric: Her speech is normal and behavior is normal. Her mood appears anxious. Cognition and memory are normal. She expresses impulsivity. She exhibits a depressed mood. She expresses suicidal ideation. Plan of suicide: overdose.    Review of Systems  Psychiatric/Behavioral: Positive for suicidal ideas. Negative for confusion, decreased concentration, self-injury and sleep disturbance. The patient is nervous/anxious.   All other systems reviewed and are negative.   There were no vitals taken for this visit.There is no height or weight on file to calculate BMI.  General Appearance: Casual  Eye Contact:  Fair  Speech:  Normal Rate  Volume:  Normal  Mood:  Anxious and Depressed  Affect:  Congruent and Depressed  Thought Process:  Coherent and Descriptions of Associations: Intact  Orientation:  Full (Time, Place, and Person)  Thought Content:  Logical  Suicidal Thoughts:  Yes.  with intent/plan  Homicidal Thoughts:  No  Memory:  Recent;   Good  Judgement:  Fair  Insight:  Fair  Psychomotor Activity:  Normal  Concentration:  Concentration: Good  Recall:  Good  Fund of Knowledge:  Good  Language:  Good  Akathisia:  No  Handed:  Right  AIMS (if indicated):     Assets:  Communication  Skills Desire for Improvement Social Support  ADL's:  Intact  Cognition:  WNL  Sleep:      Disposition: Recommend overnight observation for monitoring and stabilization Supportive therapy provided about ongoing stressors.   Treatment Plan Summary: Daily contact with patient to assess and evaluate symptoms and progress in treatment and Medication management  Observation Level/Precautions:  15 minute checks Laboratory:  Chemistry Profile UDS Psychotherapy:   Medications:  Consultations:   Discharge Concerns:   Estimated LOS: Other:      Mliss Fritz, NP 5/18/202110:03 PM

## 2019-09-12 NOTE — Progress Notes (Signed)
Patient ID: [name], female   DOB: Jun 14, 1990, 29 y.o.   MRN: [id number] Pt A&O x 4, presents with SI, plan to overdose on pills.  Positive visual hallucinations.  Pt reports increased depression and anxiety from going to numerous group homes.  Skin search completed, monitoring for safety, no distress noted, calm & cooperative at present.

## 2019-09-12 NOTE — BH Assessment (Addendum)
Assessment Note  Latara Shizu Sterr is an 29 y.o. female, who presents voluntary and unaccompanied to [facility]. Clinician asked the pt, "what brought you to the hospital?" Pt reported, "my depression and anxiety got so high, my ACT Team telling lies, I was supposed to go to a group home in [place] but I'm waiting on the guardian." Pt reported, due to her increased depression and anxiety from not going to the group home and people knocking on her door she left her room because she was looking at her pill pack. Pt reported, she feeling suicidal "a little bit." Pt reported, thinking she seen an enemy wanting to fight her but she is not sure if it's her enemy or she's seeing things. Pt denies, HI, AVH, self-injurious behaviors and access to weapons.  Pt denies, substance use. Pt is linked to [facility] ,ACT Team for medication management and counseling. Pt was unable to list her medications but reports she could be taking more medication than prescribed. Pt has previous inpatient admissions.  Pt presents alert with logical, coherent speech. Pt's eye contact was fair. Pt's mood, affect was depressed, anxious. Pt denies panic attacks. Pt's thought process was coherent, relevant. Pt's judgement was partial. Pt was oriented x4. Pt's concentration, insight and impulse control are fair. Clinician asked the pt if discharged from [facility], could she contract for safety. Pt responded, "I had to walk away from taking the pills." Pt reported, she was almost at a point of taking her pills.   * Pt denies, having family, friend supports clinician could call to obtain collateral information.*   Diagnosis: Bipolar 1 Disorder.  Past Medical History:  Past Medical History:  Diagnosis Date  . Anxiety   . Bipolar 1 disorder ([place])   . Cognitive deficits   . Depression   . Diabetes mellitus without complication ([place])   . Hypertension   . Mental disorder   . Mental health disorder   . Obesity     Past  Surgical History:  Procedure Laterality Date  . CESAREAN SECTION    . CESAREAN SECTION N/A 04/25/2013   Procedure: REPEAT CESAREAN SECTION;  Surgeon: [name], MD;  Location: [facility];  Service: Obstetrics;  Laterality: N/A;  . MASS EXCISION N/A 06/03/2012   Procedure: EXCISION MASS;  Surgeon: [name], MD;  Location: [facility];  Service: ENT;  Laterality: N/A;  Excision uvula mass  . TONSILLECTOMY N/A 06/03/2012   Procedure: TONSILLECTOMY;  Surgeon: [name], MD;  Location: [place];  Service: ENT;  Laterality: N/A;  . TONSILLECTOMY      Family History:  Family History  Problem Relation Age of Onset  . Hypertension Mother   . Diabetes Father     Social History:  reports that she has been smoking cigarettes. She has a 44.00 pack-year smoking history. She has never used smokeless tobacco. She reports previous alcohol use. She reports previous drug use. Drugs: "Crack" cocaine and Other-see comments.  Additional Social History:  Alcohol / Drug Use Pain Medications: See MAR Prescriptions: See MAR Over the Counter: See MAR History of alcohol / drug use?: No history of alcohol / drug abuse  CIWA:   COWS:    Allergies:  Allergies  Allergen Reactions  . Wellbutrin [Bupropion] Shortness Of Breath  . Omnipaque [Iohexol] Swelling and Other (See Comments)    Reaction:  Eye swelling  . Penicillins Hives and Other (See Comments)    Has patient had a PCN reaction causing  immediate rash, facial/tongue/throat swelling, SOB or lightheadedness with hypotension: Unknown Has patient had a PCN reaction causing severe rash involving mucus membranes or skin necrosis: Yes Has patient had a PCN reaction that required hospitalization Unknown Has patient had a PCN reaction occurring within the last 10 years: No If all of the above answers are "NO", then may proceed with Cephalosporin use.  Herma Mering [Benztropine]     Make pt feel crazy  . Depakote Er  [Divalproex Sodium Er] Nausea And Vomiting  . Divalproex Sodium   . Iohexol     Home Medications:  Medications Prior to Admission  Medication Sig Dispense Refill  . ABILIFY MAINTENA 400 MG PRSY prefilled syringe Inject 400 mg into the muscle every 28 (twenty-eight) days.     Marland Kitchen amantadine (SYMMETREL) 100 MG capsule Take 1 capsule (100 mg total) by mouth 2 (two) times daily for 7 days. 60 capsule 1  . ARIPiprazole ER (ABILIFY MAINTENA) 400 MG SRER injection Inject 2 mLs (400 mg total) into the muscle every 28 (twenty-eight) days. (Patient not taking: Reported on 08/25/2019) 1 each 11  . BANOPHEN 25 MG capsule Take 25 mg by mouth at bedtime.    . cetirizine (ZYRTEC ALLERGY) 10 MG tablet Take 1 tablet (10 mg total) by mouth daily. (Patient not taking: Reported on 08/25/2019) 30 tablet 0  . cholecalciferol (VITAMIN D) 25 MCG (1000 UNIT) tablet Take 1,000 Units by mouth daily.    Marland Kitchen gabapentin (NEURONTIN) 300 MG capsule Take 300 mg by mouth 3 (three) times daily.    . hydrochlorothiazide (HYDRODIURIL) 25 MG tablet Take 25 mg by mouth daily.    Marland Kitchen lamoTRIgine (LAMICTAL) 100 MG tablet Take 100 mg by mouth daily.    Marland Kitchen LEVEMIR FLEXTOUCH 100 UNIT/ML FlexPen Inject 40 Units into the skin 2 (two) times daily.     Marland Kitchen lithium carbonate (ESKALITH) 450 MG CR tablet Take 2 tablets (900 mg total) by mouth at bedtime. 60 tablet 2  . perphenazine (TRILAFON) 8 MG tablet 1 tid (Patient not taking: Reported on 08/25/2019) 90 tablet 1  . propranolol (INDERAL) 10 MG tablet Take 1 tablet (10 mg total) by mouth 2 (two) times daily. (Patient not taking: Reported on 08/25/2019) 60 tablet 2  . simvastatin (ZOCOR) 20 MG tablet Take 20 mg by mouth at bedtime.      OB/GYN Status:  No LMP recorded.  General Assessment Data Location of Assessment: [facility] Assessment Services TTS Assessment: In system Is this a Tele or Face-to-Face Assessment?: Face-to-Face Is this an Initial Assessment or a Re-assessment for this encounter?: Initial  Assessment Patient Accompanied by:: N/A Language Other than English: No Living Arrangements: Other (Comment)(Motel) What gender do you identify as?: Female Marital status: Single Living Arrangements: Other (Comment)(Motel.) Can pt return to current living arrangement?: Yes Admission Status: Voluntary Is patient capable of signing voluntary admission?: Yes Referral Source: Self/Family/Friend Insurance type: Medicaid.  Medical Screening Exam ([place]) Medical Exam completed: Yes  Crisis Care Plan Living Arrangements: Other (Comment)(Motel.) Legal Guardian: Other:(Self. ) Name of Psychiatrist: Per chart, "Natasha Bence, [place] [facility]."  Name of Therapist: Per chart, "[facility]."   Education Status Is patient currently in school?: No Is the patient employed, unemployed or receiving disability?: Receiving disability income  Risk to self with the past 6 months Suicidal Ideation: No-Not Currently/Within Last 6 Months Has patient been a risk to self within the past 6 months prior to admission? : Yes(Per chart. ) Suicidal  Intent: No-Not Currently/Within Last 6 Months Has patient had any suicidal intent within the past 6 months prior to admission? : Yes Is patient at risk for suicide?: Yes Suicidal Plan?: No-Not Currently/Within Last 6 Months Has patient had any suicidal plan within the past 6 months prior to admission? : Yes Specify Current Suicidal Plan: Pt reported, earlier she had to leave her motel room because she was about to overdose on her med's. Access to Means: Yes Specify Access to Suicidal Means: Prescribed medications.  What has been your use of drugs/alcohol within the last 12 months?: Pt denies use.  Previous Attempts/Gestures: Yes How many times?: 5 Other Self Harm Risks: SI with plan, seeking housing. Triggers for Past Attempts: Unknown Intentional Self Injurious Behavior: None(Pt denies. ) Comment - Self  Injurious Behavior: Pt denies. Family Suicide History: No Recent stressful life event(s): Other (Comment)(Not being able to go to group home, people knocking on door.) Persecutory voices/beliefs?: No Depression: Yes Depression Symptoms: Feeling angry/irritable, Feeling worthless/self pity, Guilt, Fatigue, Isolating, Tearfulness, Despondent Substance abuse history and/or treatment for substance abuse?: No Suicide prevention information given to non-admitted patients: Not applicable  Risk to Others within the past 6 months Homicidal Ideation: No(Pt denies. ) Does patient have any lifetime risk of violence toward others beyond the six months prior to admission? : Yes (comment)(Per chart, pt has history of agitation/aggressive behaviors.) Thoughts of Harm to Others: No Comment - Thoughts of Harm to Others: Pt denies.  Current Homicidal Intent: No Current Homicidal Plan: No Access to Homicidal Means: No Describe Access to Homicidal Means: NA Identified Victim: NA History of harm to others?: No Assessment of Violence: None Noted Violent Behavior Description: NA Does patient have access to weapons?: No(Pt denies. ) Criminal Charges Pending?: No Does patient have a court date: No Is patient on probation?: No  Psychosis Hallucinations: None noted Delusions: None noted  Mental Status Report Appearance/Hygiene: Unremarkable Eye Contact: Fair Motor Activity: Unremarkable Speech: Logical/coherent Level of Consciousness: Alert Mood: Depressed, Anxious Affect: Depressed, Anxious Anxiety Level: Moderate Thought Processes: Coherent, Relevant Judgement: Partial Orientation: Person, Place, Time, Situation Obsessive Compulsive Thoughts/Behaviors: None  Cognitive Functioning Concentration: Normal Is patient IDD: No Insight: Fair Appetite: Good Sleep: Increased Total Hours of Sleep: 10 Vegetative Symptoms: None  ADLScreening Procedure [facility] Assessment Services) Patient's cognitive ability  adequate to safely complete daily activities?: Yes Patient able to express need for assistance with ADLs?: Yes Independently performs ADLs?: Yes (appropriate for developmental age)  Prior Inpatient Therapy Prior Inpatient Therapy: Yes Prior Therapy Dates: Per chart, "multiple admissions."  Prior Therapy Facilty/Provider(s): Per chart, "[facility] [place]."  Reason for Treatment: Bipolar Disorder  Prior Outpatient Therapy Prior Outpatient Therapy: Yes Prior Therapy Dates: Current. Prior Therapy Facilty/Provider(s): [facility]. Reason for Treatment: Medication management.  Does patient have an ACCT team?: Yes Does patient have Intensive In-House Services?  : No Does patient have [facility] services? : No Does patient have P4CC services?: No  ADL Screening (condition at time of admission) Patient's cognitive ability adequate to safely complete daily activities?: Yes Is the patient deaf or have difficulty hearing?: No Does the patient have difficulty seeing, even when wearing glasses/contacts?: No Does the patient have difficulty concentrating, remembering, or making decisions?: Yes Patient able to express need for assistance with ADLs?: Yes Does the patient have difficulty dressing or bathing?: No Independently performs ADLs?: Yes (appropriate for developmental age) Does the patient have difficulty walking or climbing stairs?: No Weakness of Legs: None Weakness of Arms/Hands: None  Home Assistive Devices/Equipment Home Assistive Devices/Equipment: None    Abuse/Neglect Assessment (Assessment to be complete while patient is alone) Abuse/Neglect Assessment Can Be Completed: Yes Physical Abuse: Denies Verbal Abuse: Denies Sexual Abuse: Denies Exploitation of patient/patient's resources: Denies Self-Neglect: Denies     Advance Directives (For Healthcare) Does Patient Have a Medical Advance Directive?: No          Disposition: Adaku Anike, NP  recommends overnight observation, reassessed by psychiatry.    Disposition Initial Assessment Completed for this Encounter: Yes  On Site Evaluation by: Vertell Novak, MS, [facility], [facility]. Reviewed with Physician:  Talbot Grumbling, NP.   Vertell Novak 09/12/2019 8:44 PM   Vertell Novak, [place], [facility], [facility] Triage Specialist [phone]

## 2019-09-13 DIAGNOSIS — F314 Bipolar disorder, current episode depressed, severe, without psychotic features: Secondary | ICD-10-CM | POA: Diagnosis not present

## 2019-09-13 DIAGNOSIS — F200 Paranoid schizophrenia: Secondary | ICD-10-CM | POA: Diagnosis not present

## 2019-09-13 DIAGNOSIS — F209 Schizophrenia, unspecified: Secondary | ICD-10-CM | POA: Diagnosis present

## 2019-09-13 LAB — GLUCOSE, CAPILLARY: Glucose-Capillary: 195 mg/dL — ABNORMAL HIGH (ref 70–99)

## 2019-09-13 MED ORDER — PERPHENAZINE 2 MG PO TABS: 6.0000 mg | ORAL_TABLET | Freq: Two times a day (BID) | ORAL | Status: DC

## 2019-09-13 MED ORDER — PERPHENAZINE 2 MG PO TABS
6.0000 mg | ORAL_TABLET | Freq: Once | ORAL | Status: AC
  Administered 2019-09-13: 6 mg via ORAL
  Filled 2019-09-13: qty 3

## 2019-09-13 MED ORDER — LITHIUM CARBONATE 300 MG PO CAPS: 900.0000 mg | ORAL_CAPSULE | Freq: Every day | ORAL | Status: DC

## 2019-09-13 MED ORDER — FLUPHENAZINE HCL 5 MG PO TABS: 8.0000 mg | ORAL_TABLET | Freq: Three times a day (TID) | ORAL | Status: DC

## 2019-09-13 MED ORDER — GABAPENTIN 300 MG PO CAPS: 300.0000 mg | ORAL_CAPSULE | Freq: Three times a day (TID) | ORAL | Status: DC | PRN

## 2019-09-13 MED ORDER — AMANTADINE HCL 100 MG PO CAPS
100.0000 mg | ORAL_CAPSULE | Freq: Two times a day (BID) | ORAL | Status: DC
  Filled 2019-09-13 (×2): qty 1

## 2019-09-13 MED ORDER — INSULIN GLARGINE 100 UNIT/ML ~~LOC~~ SOLN: 40.0000 [IU] | Freq: Every day | SUBCUTANEOUS | Status: DC

## 2019-09-13 MED ORDER — MAGNESIUM HYDROXIDE 400 MG/5ML PO SUSP: 30.0000 mL | Freq: Every day | ORAL | Status: DC | PRN

## 2019-09-13 MED ORDER — SIMVASTATIN 20 MG PO TABS: 20.0000 mg | ORAL_TABLET | Freq: Every day | ORAL | Status: DC

## 2019-09-13 MED ORDER — ALUM & MAG HYDROXIDE-SIMETH 200-200-20 MG/5ML PO SUSP: 30.0000 mL | ORAL | Status: DC | PRN

## 2019-09-13 MED ORDER — INSULIN ASPART 100 UNIT/ML ~~LOC~~ SOLN
0.0000 [IU] | Freq: Three times a day (TID) | SUBCUTANEOUS | Status: DC
  Administered 2019-09-13: 3 [IU] via SUBCUTANEOUS

## 2019-09-13 MED ORDER — DIPHENHYDRAMINE HCL 25 MG PO CAPS: 25.0000 mg | ORAL_CAPSULE | Freq: Every evening | ORAL | Status: DC | PRN

## 2019-09-13 MED ORDER — ACETAMINOPHEN 325 MG PO TABS: 650.0000 mg | ORAL_TABLET | Freq: Four times a day (QID) | ORAL | Status: DC | PRN

## 2019-09-13 MED ORDER — DIPHENHYDRAMINE HCL 25 MG PO CAPS: 25.0000 mg | ORAL_CAPSULE | Freq: Every day | ORAL | Status: DC

## 2019-09-13 MED ORDER — PROPRANOLOL HCL 10 MG PO TABS
10.0000 mg | ORAL_TABLET | Freq: Two times a day (BID) | ORAL | Status: DC
  Administered 2019-09-13: 10 mg via ORAL
  Filled 2019-09-13: qty 1

## 2019-09-13 MED ORDER — GLIPIZIDE ER 10 MG PO TB24
10.0000 mg | ORAL_TABLET | Freq: Two times a day (BID) | ORAL | Status: DC
  Filled 2019-09-13 (×4): qty 1

## 2019-09-13 MED ORDER — LAMOTRIGINE 100 MG PO TABS
100.0000 mg | ORAL_TABLET | Freq: Every day | ORAL | Status: DC
  Administered 2019-09-13: 100 mg via ORAL
  Filled 2019-09-13: qty 1

## 2019-09-13 MED ORDER — HYDROCHLOROTHIAZIDE 25 MG PO TABS
25.0000 mg | ORAL_TABLET | Freq: Every day | ORAL | Status: DC
  Administered 2019-09-13: 25 mg via ORAL
  Filled 2019-09-13: qty 1

## 2019-09-13 MED ORDER — HYDROXYZINE HCL 50 MG PO TABS
50.0000 mg | ORAL_TABLET | Freq: Once | ORAL | Status: AC
  Administered 2019-09-13: 50 mg via ORAL
  Filled 2019-09-13: qty 1

## 2019-09-13 MED ORDER — GLIPIZIDE ER 10 MG PO TB24
10.0000 mg | ORAL_TABLET | Freq: Once | ORAL | Status: AC
  Administered 2019-09-13: 10 mg via ORAL
  Filled 2019-09-13: qty 1

## 2019-09-13 MED ORDER — VITAMIN D3 25 MCG PO TABS
1000.0000 [IU] | ORAL_TABLET | Freq: Every day | ORAL | Status: DC
  Administered 2019-09-13: 1000 [IU] via ORAL
  Filled 2019-09-13 (×4): qty 1

## 2019-09-13 MED ORDER — LITHIUM CARBONATE ER 450 MG PO TBCR: 900.0000 mg | EXTENDED_RELEASE_TABLET | Freq: Every day | ORAL | Status: DC

## 2019-09-13 NOTE — Discharge Summary (Signed)
Observation physician admission/discharge Summary Note  Patient:  [name] is an 29 y.o., female MRN:  [id number] DOB:  12/26/1990 Patient phone:  [phone] (home)  Patient address:   [name] [address] [place] [address],  Total Time spent with patient: 30 minutes  Date of Admission:  09/12/2019 Date of Discharge: 09/13/2019  Reason for Admission: Anxiety about group home  Principal Problem: <principal problem not specified> Discharge Diagnoses: Active Problems:   Bipolar I disorder, most recent episode depressed, severe without psychotic features ([place])   Schizophrenia ([place])   Past Psychiatric History: Patient has a history of bipolar disorder type I, most recently depressed severe with psychotic features.  Her last psychiatric hospitalization at our facility was on 07/29/2019 to 07/30/2019.  This was an observation overnight, and she was discharged the next day.  She has had multiple psychiatric hospitalizations in the past.  Past Medical History:  Past Medical History:  Diagnosis Date  . Anxiety   . Bipolar 1 disorder ([place])   . Cognitive deficits   . Depression   . Diabetes mellitus without complication ([place])   . Hypertension   . Mental disorder   . Mental health disorder   . Obesity     Past Surgical History:  Procedure Laterality Date  . CESAREAN SECTION    . CESAREAN SECTION N/A 04/25/2013   Procedure: REPEAT CESAREAN SECTION;  Surgeon: [name] Bellman, MD;  Location: [facility];  Service: Obstetrics;  Laterality: N/A;  . MASS EXCISION N/A 06/03/2012   Procedure: EXCISION MASS;  Surgeon: [name], MD;  Location: [facility];  Service: ENT;  Laterality: N/A;  Excision uvula mass  . TONSILLECTOMY N/A 06/03/2012   Procedure: TONSILLECTOMY;  Surgeon: [name], MD;  Location: [facility];  Service: ENT;  Laterality: N/A;  . TONSILLECTOMY     Family History:  Family History  Problem Relation Age of  Onset  . Hypertension Mother   . Diabetes Father    Family Psychiatric  History: Noncontributory Social History:  Social History   Substance and Sexual Activity  Alcohol Use Not Currently   Comment: occ: last intake 4 mts ago     Social History   Substance and Sexual Activity  Drug Use Not Currently  . Types: "Crack" cocaine, Other-see comments   Comment: Patient reports hx of smoking Crack    Social History   Socioeconomic History  . Marital status: Single    Spouse name: Not on file  . Number of children: Not on file  . Years of education: Not on file  . Highest education level: Not on file  Occupational History  . Not on file  Tobacco Use  . Smoking status: Current Every Day Smoker    Packs/day: 4.00    Years: 11.00    Pack years: 44.00    Types: Cigarettes  . Smokeless tobacco: Never Used  . Tobacco comment: Pt declined  Substance and Sexual Activity  . Alcohol use: Not Currently    Comment: occ: last intake 4 mts ago  . Drug use: Not Currently    Types: "Crack" cocaine, Other-see comments    Comment: Patient reports hx of smoking Crack  . Sexual activity: Yes    Birth control/protection: Implant  Other Topics Concern  . Not on file  Social History Narrative   ** Merged History Encounter **       Social Determinants of Health   Financial Resource Strain:   .  Difficulty of Paying Living Expenses:   Food Insecurity:   . Worried About Charity fundraiser in the Last Year:   . Arboriculturist in the Last Year:   Transportation Needs:   . Film/video editor (Medical):   Marland Kitchen Lack of Transportation (Non-Medical):   Physical Activity:   . Days of Exercise per Week:   . Minutes of Exercise per Session:   Stress:   . Feeling of Stress :   Social Connections:   . Frequency of Communication with Friends and Family:   . Frequency of Social Gatherings with Friends and Family:   . Attends Religious Services:   . Active Member of Clubs or Organizations:    . Attends Archivist Meetings:   Marland Kitchen Marital Status:     Hospital Course: Patient is seen and examined.  Patient is a 29 year old female with the above-stated past psychiatric history who [name] as [facility] walk[facility]in on 09/12/2019.  The patient stated that she had worsening anxiety and depression.  She stated she was staying in a hotel that had been provided by her ACTT service.  They had been trying to get her admitted to a group home, but were having a difficult time with that.  That made her significantly anxious.  She stated that people at the hotel would knock on her door and bother her.  During the interview in the evening she was agitated and anxious and the recommendation was to review her case in the morning.  I saw the patient on the morning of 09/13/2019.  She described a similar circumstances to me.  She does want interact team to help her get out of the hotel and go to the group home.  She felt alone and isolated.  She denied any auditory or visual hallucinations.  She denied any suicidal or homicidal ideation.  Unfortunately she was significantly anxious at the time of interview, but her psychiatric medications had not been continued when she was evaluated on the 18th.  Her antipsychotic medications as well as anxiety medicines were restarted on the morning of 09/13/2019.  Review of her laboratories revealed her blood sugar to be elevated at 195.  No other laboratories were obtained.  Her vital signs were stable and she was afebrile.  It was decided that she could be discharged back to her ACTT service for continued outpatient treatment and placement.  Physical Findings: AIMS: Facial and Oral Movements Muscles of Facial Expression: None, normal Lips and Perioral Area: None, normal Jaw: None, normal Tongue: None, normal,Extremity Movements Upper (arms, wrists, hands, fingers): None, normal Lower (legs, knees, ankles, toes): None, normal, Trunk  Movements Neck, shoulders, hips: None, normal, Overall Severity Severity of abnormal movements (highest score from questions above): None, normal Incapacitation due to abnormal movements: None, normal Patient's awareness of abnormal movements (rate only patient's report): No Awareness, Dental Status Current problems with teeth and/or dentures?: No Does patient usually wear dentures?: No  CIWA:  CIWA-Ar Total: 0 COWS:  COWS Total Score: 1  Musculoskeletal: Strength & Muscle Tone: within normal limits Gait & Station: normal Patient leans: N/A  Psychiatric Specialty Exam: Physical Exam  Review of Systems  Blood pressure 131/77, pulse (!) 103, temperature 98.4 F (36.9 C), temperature source Oral, resp. rate 18, height 5\' 7"  (1.702 m), weight 115.2 kg, SpO2 99 %.Body mass index is 39.78 kg/m.  General Appearance: Casual  Eye Contact:  Good  Speech:  Normal Rate  Volume:  Normal  Mood:  Anxious  Affect:  Congruent  Thought Process:  Goal Directed and Descriptions of Associations: Intact  Orientation:  Full (Time, Place, and Person)  Thought Content:  Delusions  Suicidal Thoughts:  No  Homicidal Thoughts:  No  Memory:  Immediate;   Fair Recent;   Fair Remote;   Fair  Judgement:  Intact  Insight:  Fair  Psychomotor Activity:  Increased  Concentration:  Concentration: Fair and Attention Span: Fair  Recall:  [employer] of Knowledge:  Fair  Language:  Good  Akathisia:  Negative  Handed:  Right  AIMS (if indicated):     Assets:  Desire for Improvement Housing Resilience  ADL's:  Intact  Cognition:  WNL  Sleep:           Has this patient used any form of tobacco in the last 30 days? (Cigarettes, Smokeless Tobacco, Cigars, and/or Pipes) Yes, No  Blood Alcohol level:  Lab Results  Component Value Date   ETH <10 07/14/2019   ETH <10 0000000    Metabolic Disorder Labs:  Lab Results  Component Value Date   HGBA1C 5.9 (H) 07/14/2019   MPG 122.63 07/14/2019   MPG  102.54 06/23/2019   Lab Results  Component Value Date   PROLACTIN 5.3 05/19/2019   PROLACTIN 5.2 10/11/2018   Lab Results  Component Value Date   CHOL 141 07/14/2019   TRIG 76 07/14/2019   HDL 49 07/14/2019   CHOLHDL 2.9 07/14/2019   VLDL 15 07/14/2019   LDLCALC 77 07/14/2019   LDLCALC 85 06/23/2019    See Psychiatric Specialty Exam and Suicide Risk Assessment completed by Attending Physician prior to discharge.  Discharge destination:  Home  Is patient on multiple antipsychotic therapies at discharge:  No   Has Patient had three or more failed trials of antipsychotic monotherapy by history:  No  Recommended Plan for Multiple Antipsychotic Therapies: NA  Discharge Instructions    Diet - low sodium heart healthy   Complete by: As directed    Increase activity slowly   Complete by: As directed      Allergies as of 09/13/2019      Reactions   Wellbutrin [bupropion] Shortness Of Breath   Omnipaque [iohexol] Swelling, Other (See Comments)   Reaction:  Eye swelling   Penicillins Hives, Other (See Comments)   Has patient had a PCN reaction causing immediate rash, facial/tongue/throat swelling, SOB or lightheadedness with hypotension: Unknown Has patient had a PCN reaction causing severe rash involving mucus membranes or skin necrosis: Yes Has patient had a PCN reaction that required hospitalization Unknown Has patient had a PCN reaction occurring within the last 10 years: No If all of the above answers are "NO", then may proceed with Cephalosporin use.   Cogentin [benztropine]    Make pt feel crazy   Depakote Er [divalproex Sodium Er] Nausea And Vomiting   Divalproex Sodium    Iohexol       Medication List    TAKE these medications     Indication  ARIPiprazole ER 400 MG Srer injection Commonly known as: ABILIFY MAINTENA Inject 2 mLs (400 mg total) into the muscle every 28 (twenty-eight) days.  Indication: MIXED BIPOLAR AFFECTIVE DISORDER   Banophen 25 mg  capsule Generic drug: diphenhydrAMINE Take 25 mg by mouth at bedtime as needed.  Indication: Allergic Conjunctivitis   cholecalciferol 25 MCG (1000 UNIT) tablet Commonly known as: VITAMIN D Take 1,000 Units by mouth daily.  Indication: Vitamin  D Deficiency   gabapentin 300 MG capsule Commonly known as: NEURONTIN Take 300 mg by mouth 3 (three) times daily as needed.  Indication: Disease of the Peripheral Nerves   glipiZIDE 10 MG 24 hr tablet Commonly known as: GLUCOTROL XL Take 10 mg by mouth 2 (two) times daily before lunch and supper.  Indication: Type 2 Diabetes   hydrochlorothiazide 25 MG tablet Commonly known as: HYDRODIURIL Take 25 mg by mouth daily.  Indication: High Blood Pressure Disorder   lamoTRIgine 100 MG tablet Commonly known as: LAMICTAL Take 100 mg by mouth daily.  Indication: Manic-Depression   Levemir FlexTouch 100 UNIT/ML FlexPen Generic drug: insulin detemir Inject 40 Units into the skin 2 (two) times daily.  Indication: Type 2 Diabetes   lithium carbonate 450 MG CR tablet Commonly known as: ESKALITH Take 2 tablets (900 mg total) by mouth at bedtime.  Indication: Depressive Phase of Manic-Depression   perphenazine 2 MG tablet Commonly known as: TRILAFON Take 6 mg by mouth 2 (two) times daily.  Indication: Schizophrenia   propranolol 10 MG tablet Commonly known as: INDERAL Take 1 tablet (10 mg total) by mouth 2 (two) times daily.  Indication: High Blood Pressure Disorder, Lithium-Induced Tremor   simvastatin 20 MG tablet Commonly known as: ZOCOR Take 20 mg by mouth at bedtime.  Indication: High Amount of Fats in the Blood        Follow-up recommendations:  Activity:  Ad lib. Diet:  Follow a diabetic diet Other:  Take medications as directed  Comments: To suicidal, homicidal or psychotic symptoms return return to emergency department.  Follow-up with your act team.  Signed: Sharma Covert, MD 09/13/2019, 12:33 PM

## 2019-09-13 NOTE — Progress Notes (Signed)
  COVID-19 Daily Checkoff  Have you had a fever (temp > 37.80C/100F)  in the past 24 hours?  No  If you have had runny nose, nasal congestion, sneezing in the past 24 hours, has it worsened? No  COVID-19 EXPOSURE  Have you traveled outside the state in the past 14 days? No  Have you been in contact with someone with a confirmed diagnosis of COVID-19 or PUI in the past 14 days without wearing appropriate PPE? No  Have you been living in the same home as a person with confirmed diagnosis of COVID-19 or a PUI (household contact)? No  Have you been diagnosed with COVID-19? No               D:  Patient presents A&O x 4, calm and cooperative throughout interview with RN. Patient states, "My anxiety went up worrying about what group home I'm gonna be put in. They keep giving me the run around about it so I thought about killing myself". Patient denies SI/HI/AVH at current. Appetite good. Patient denies any physical complaints when asked.   A: Support and encouragement provided. Routine safety checks conducted every 15 minutes per unit protocol. Encouraged patient to notify staff if thoughts of harm toward self or others arise. Patient verbalizes agreement.   R: Patient remains safe at this time, patient verbally contracts for safety at this time. Will continue to monitor.

## 2019-09-13 NOTE — Discharge Instructions (Signed)
For your behavioral health needs, you are advised to continue treatment with the [facility]: ° °     [facility] °     [facility], [address] °     [address] °     [place], [place]  [address] °     [phone] °     Crisis number: [phone] °

## 2019-09-13 NOTE — Progress Notes (Signed)
Patient A&O x 4, ambulatory. Patient discharged in no acute distress. Patient denied SI/HI, A/VH upon discharge. Patient verbalized understanding of all discharge instructions explained by staff, to include medications and safety plan. Patient reported mood 10/10.  Pt belongings returned to patient from locker intact and belonging sheet signed. Patient discharged to lobby to patient's ACTT. Goals met, safety maintained.

## 2019-09-13 NOTE — BH Assessment (Signed)
[place] Assessment Progress Note  Per Myles Lipps, MD, this pt does not require psychiatric hospitalization at this time.  Pt is to be discharged from the [facility] Observation Unit with recommendation to continue treatment with the [facility].  This has been included in pt's discharge instructions.  Pt's nurse, Baruch Goldmann, has been notified.  Jalene Mullet, [place] Triage Specialist [phone]

## 2019-09-13 NOTE — Progress Notes (Signed)
Pt has been psychiatrically cleared. CSW spoke with [name], housing specialist at [facility] [phone]), who agreed to pick pt up from [facility] [place] [place] [place] [facility] [phone]

## 2019-10-05 ENCOUNTER — Emergency Department (HOSPITAL_COMMUNITY)
Admission: EM | Admit: 2019-10-05 | Discharge: 2019-10-06 | Disposition: A | Discharge status: Home / Self Care | Payer: Medicaid Other | Attending: Emergency Medicine | Admitting: Emergency Medicine

## 2019-10-05 ENCOUNTER — Other Ambulatory Visit: Payer: Self-pay

## 2019-10-05 DIAGNOSIS — Z79891 Long term (current) use of opiate analgesic: Secondary | ICD-10-CM | POA: Diagnosis not present

## 2019-10-05 DIAGNOSIS — R45851 Suicidal ideations: Secondary | ICD-10-CM | POA: Insufficient documentation

## 2019-10-05 DIAGNOSIS — F319 Bipolar disorder, unspecified: Secondary | ICD-10-CM | POA: Diagnosis not present

## 2019-10-05 DIAGNOSIS — Z7984 Long term (current) use of oral hypoglycemic drugs: Secondary | ICD-10-CM | POA: Diagnosis not present

## 2019-10-05 DIAGNOSIS — R451 Restlessness and agitation: Secondary | ICD-10-CM | POA: Diagnosis not present

## 2019-10-05 DIAGNOSIS — Z20822 Contact with and (suspected) exposure to covid-19: Secondary | ICD-10-CM | POA: Insufficient documentation

## 2019-10-05 DIAGNOSIS — E119 Type 2 diabetes mellitus without complications: Secondary | ICD-10-CM | POA: Insufficient documentation

## 2019-10-05 DIAGNOSIS — I10 Essential (primary) hypertension: Secondary | ICD-10-CM | POA: Diagnosis not present

## 2019-10-05 DIAGNOSIS — F419 Anxiety disorder, unspecified: Secondary | ICD-10-CM | POA: Diagnosis present

## 2019-10-05 DIAGNOSIS — F17210 Nicotine dependence, cigarettes, uncomplicated: Secondary | ICD-10-CM | POA: Diagnosis not present

## 2019-10-05 DIAGNOSIS — F411 Generalized anxiety disorder: Secondary | ICD-10-CM | POA: Diagnosis not present

## 2019-10-05 LAB — SARS CORONAVIRUS 2 BY RT PCR (HOSPITAL ORDER, PERFORMED IN ~~LOC~~ HOSPITAL LAB): SARS Coronavirus 2: NEGATIVE

## 2019-10-05 LAB — I-STAT BETA HCG BLOOD, ED (MC, WL, AP ONLY): I-stat hCG, quantitative: <5 m[IU]/mL (ref <5)

## 2019-10-05 MED ORDER — OLANZAPINE 5 MG PO TBDP: 5.0000 mg | ORAL_TABLET | Freq: Every day | ORAL | Status: DC

## 2019-10-05 MED ORDER — OLANZAPINE 10 MG PO TBDP
10.0000 mg | ORAL_TABLET | Freq: Once | ORAL | Status: AC
  Administered 2019-10-05: 10 mg via ORAL
  Filled 2019-10-05: qty 1

## 2019-10-05 NOTE — ED Triage Notes (Signed)
Pt brought to [facility] by EMS.  Pt called 911, saying going to hurt self.pt voluntary.  Pt anxious . Pt wants help.
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
## 2019-10-05 NOTE — ED Provider Notes (Signed)
[place] DEPT Provider Note   CSN: [id number] Arrival date & time: 10/05/19  1808     History Chief Complaint  Patient presents with  . Suicidal    anxiety    [name] is a 29 y.o. female.  29 yo F with a chief complaints of anxiety.  Patient and her roommate apparently found someone dead earlier today.  Feels that her anxiety is really high and she would like some medication for that.  She does have a history of bipolar disorder and she has not been taking her medicine for the next week.  She told EMS that she thought she might hurt herself.  Denied this initially to staff though to me she tells me she is planning on harming herself.  Plans to overdose on her medications.  Has tried this before in the past.  Tells me that she is compliant with her medications.  Denies cough congestion fevers chills myalgias nausea vomiting diarrhea abdominal pain headaches neck pain.   The history is provided by the patient.  Illness Severity:  Moderate Onset quality:  Gradual Duration:  2 hours Timing:  Constant Progression:  Unchanged Chronicity:  New Associated symptoms: no chest pain, no congestion, no fever, no headaches, no myalgias, no nausea, no rhinorrhea, no shortness of breath, no vomiting and no wheezing        Past Medical History:  Diagnosis Date  . Anxiety   . Bipolar 1 disorder ([place])   . Cognitive deficits   . Depression   . Diabetes mellitus without complication ([place])   . Hypertension   . Mental disorder   . Mental health disorder   . Obesity     Patient Active Problem List   Diagnosis Date Noted  . Schizophrenia ([place]) 09/13/2019  . Bipolar 1 disorder ([place]) 06/23/2019  . MDD (major depressive disorder) 10/10/2018  . Schizoaffective disorder, bipolar type ([place]) 09/25/2018  . Bipolar I disorder ([place]) 06/13/2018  . HTN (hypertension) 05/03/2018  . Tobacco use disorder 05/03/2018  . Bipolar I disorder, most recent episode  depressed, severe without psychotic features ([place]) 05/02/2018  . Adjustment disorder with emotional disturbance 01/02/2018  . 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 Moderate bipolar I disorder, most recent episode depressed ([place])   . Psychosis ([place])   . 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 OCD (obsessive compulsive disorder) 10/05/2016  . Major depressive disorder, recurrent episode, mild ([place]) 05/04/2016  . 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 Learning disability 07/18/2015  . 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 Diabetes mellitus ([place]) 07/18/2015  . 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 Hyperlipidemia 07/18/2015  . Severe episode of recurrent major depressive disorder, without psychotic features ([place])   . Suicidal ideation   . Drug overdose   . Cognitive deficits 10/12/2012  . Generalized anxiety disorder 06/28/2012    Past Surgical History:  Procedure Laterality Date  . CESAREAN SECTION    . CESAREAN SECTION N/A 04/25/2013   Procedure: REPEAT CESAREAN SECTION;  Surgeon: [name], MD;  Location: [place] [facility];  Service: Obstetrics;  Laterality: N/A;  . MASS EXCISION N/A 06/03/2012   Procedure: EXCISION MASS;  Surgeon: [name], MD;  Location: [place];  Service: ENT;  Laterality: N/A;  Excision uvula mass  . TONSILLECTOMY N/A 06/03/2012   Procedure: TONSILLECTOMY;  Surgeon: [name], MD;  Location: [place];  Service:  ENT;  Laterality: N/A;  . TONSILLECTOMY       OB History    Gravida  3   Para  3   Term  3   Preterm  0   AB  0   Living  3     SAB  0   TAB  0   Ectopic  0   Multiple      Live Births  3           Family History  Problem Relation Age of Onset  . Hypertension Mother   . Diabetes Father     Social History   Tobacco Use  . Smoking status:  Current Every Day Smoker    Packs/day: 4.00    Years: 11.00    Pack years: 44.00    Types: Cigarettes  . Smokeless tobacco: Never Used  . Tobacco comment: Pt declined  Vaping Use  . Vaping Use: Never used  Substance Use Topics  . Alcohol use: Not Currently    Comment: occ: last intake 4 mts ago  . Drug use: Not Currently    Types: "Crack" cocaine, Other-see comments    Comment: Patient reports hx of smoking Crack    Home Medications Prior to Admission medications   Medication Sig Start Date End Date Taking? Authorizing Provider  ARIPiprazole ER (ABILIFY MAINTENA) 400 MG SRER injection Inject 2 mLs (400 mg total) into the muscle every 28 (twenty-eight) days. 06/28/19   Johnn Hai, MD  BANOPHEN 25 MG capsule Take 25 mg by mouth at bedtime as needed.  08/01/19   [provider]  cholecalciferol (VITAMIN D) 25 MCG (1000 UNIT) tablet Take 1,000 Units by mouth daily. 08/21/19   [provider]  gabapentin (NEURONTIN) 300 MG capsule Take 300 mg by mouth 3 (three) times daily as needed.  08/21/19   [provider]  glipiZIDE (GLUCOTROL XL) 10 MG 24 hr tablet Take 10 mg by mouth 2 (two) times daily before lunch and supper.    [provider]  hydrochlorothiazide (HYDRODIURIL) 25 MG tablet Take 25 mg by mouth daily. 08/21/19   [provider]  lamoTRIgine (LAMICTAL) 100 MG tablet Take 100 mg by mouth daily. 08/21/19   [provider]  LEVEMIR FLEXTOUCH 100 UNIT/ML FlexPen Inject 40 Units into the skin 2 (two) times daily.  08/09/19   [provider]  lithium carbonate (ESKALITH) 450 MG CR tablet Take 2 tablets (900 mg total) by mouth at bedtime. 06/02/19   Johnn Hai, MD  perphenazine (TRILAFON) 2 MG tablet Take 6 mg by mouth 2 (two) times daily.    [provider]  propranolol (INDERAL) 10 MG tablet Take 1 tablet (10 mg total) by mouth 2 (two) times daily. 06/02/19   Johnn Hai, MD  simvastatin (ZOCOR) 20 MG tablet Take 20 mg by  mouth at bedtime. 08/21/19   [provider]    Allergies    Wellbutrin [bupropion], Omnipaque [iohexol], Penicillins, Cogentin [benztropine], Depakote er [divalproex sodium er], Divalproex sodium, and Iohexol  Review of Systems   Review of Systems  Constitutional: Negative for chills and fever.  HENT: Negative for congestion and rhinorrhea.   Eyes: Negative for redness and visual disturbance.  Respiratory: Negative for shortness of breath and wheezing.   Cardiovascular: Negative for chest pain and palpitations.  Gastrointestinal: Negative for nausea and vomiting.  Genitourinary: Negative for dysuria and urgency.  Musculoskeletal: Negative for arthralgias and myalgias.  Skin: Negative for pallor and wound.  Neurological:  Negative for dizziness and headaches.  Psychiatric/Behavioral: Positive for agitation. The patient is nervous/anxious.     Physical Exam Updated Vital Signs BP (!) 150/98 (BP Location: Right Arm)   Pulse 96   Temp 98.9 F (37.2 C) (Oral)   Resp 20   SpO2 98%   Physical Exam Vitals and nursing note reviewed.  Constitutional:      General: She is not in acute distress.    Appearance: She is well-developed. She is not diaphoretic.  HENT:     Head: Normocephalic and atraumatic.  Eyes:     Pupils: Pupils are equal, round, and reactive to light.  Cardiovascular:     Rate and Rhythm: Normal rate and regular rhythm.     Heart sounds: No murmur heard.  No friction rub. No gallop.   Pulmonary:     Effort: Pulmonary effort is normal.     Breath sounds: No wheezing or rales.  Abdominal:     General: There is no distension.     Palpations: Abdomen is soft.     Tenderness: There is no abdominal tenderness.  Musculoskeletal:        General: No tenderness.     Cervical back: Normal range of motion and neck supple.  Skin:    General: Skin is warm and dry.  Neurological:     Mental Status: She is alert and oriented to person, place, and time.    Psychiatric:        Behavior: Behavior is agitated and hyperactive.     ED Results / Procedures / Treatments   Labs (all labs ordered are listed, but only abnormal results are displayed) Labs Reviewed  SARS CORONAVIRUS 2 BY RT PCR (HOSPITAL ORDER, [place] [facility])  LITHIUM LEVEL  I-STAT BETA HCG BLOOD, ED ([facility], [facility], [facility] ONLY)    EKG None  Radiology No results found.  Procedures Procedures (including critical care time)  Medications Ordered in ED Medications  OLANZapine zydis (ZYPREXA) disintegrating tablet 10 mg (10 mg Oral Given 10/05/19 1852)    ED Course  I have reviewed the triage vital signs and the nursing notes.  Pertinent labs & imaging results that were available during my care of the patient were reviewed by me and considered in my medical decision making (see chart for details).    MDM Rules/Calculators/A&P                          29 yo F with a chief complaints of acute anxiety after seeing someone dead earlier today.  Initially told EMS that she was thinking about hurting herself.  Did not mention this to the staff though to me she does endorse that she plans to harm herself.  Feels that she cannot live in this house where she found someone dead.  We will have TTS evaluate.  She complains that her anxiety is very high.  We will give her a dose of olanzapine orally.  I feel she is medically clear.  TTS recommends reassess in the morning.   The patients results and plan were reviewed and discussed.   Any x-rays performed were independently reviewed by myself.   Differential diagnosis were considered with the presenting HPI.  Medications  OLANZapine zydis (ZYPREXA) disintegrating tablet 10 mg (10 mg Oral Given 10/05/19 1852)    Vitals:   10/05/19 1829  BP: (!) 150/98  Pulse: 96  Resp: 20  Temp: 98.9 F (37.2 C)  TempSrc: Oral  SpO2: 98%    Final diagnoses:  Suicidal ideation     Final Clinical Impression(s) / ED  Diagnoses Final diagnoses:  Suicidal ideation    Rx / DC Orders ED Discharge Orders    None       Deno Etienne, DO 10/06/19 0488

## 2019-10-06 DIAGNOSIS — F411 Generalized anxiety disorder: Secondary | ICD-10-CM

## 2019-10-06 MED ORDER — HYDROXYZINE HCL 25 MG PO TABS: 25.0000 mg | ORAL_TABLET | Freq: Three times a day (TID) | ORAL | 0 refills | Status: DC | PRN

## 2019-10-06 MED ORDER — HYDROXYZINE HCL 25 MG PO TABS
25.0000 mg | ORAL_TABLET | Freq: Three times a day (TID) | ORAL | Status: DC | PRN
  Administered 2019-10-06: 25 mg via ORAL
  Filled 2019-10-06: qty 1

## 2019-10-06 NOTE — ED Provider Notes (Signed)
Emergency Medicine Observation Re-evaluation Note  [name] is a 29 y.o. female, seen on rounds today.  Pt initially presented to the ED for complaints of Suicidal (anxiety) Currently, the patient is sleeping, awaiting am psych re eval.  Physical Exam  BP 112/70 (BP Location: Right Arm)    Pulse 84    Temp 97.8 F (36.6 C) (Oral)    Resp 20    SpO2 95%  Physical Exam Vitals and nursing note reviewed.  Constitutional:      General: She is not in acute distress.    Appearance: Normal appearance.  HENT:     Head: Normocephalic and atraumatic.  Pulmonary:     Effort: Pulmonary effort is normal. No respiratory distress.  Musculoskeletal:        General: No deformity or signs of injury.     ED Course / MDM  EKG:    I have reviewed the labs performed to date as well as medications administered while in observation.  Recent changes in the last 24 hours include TTS rec am re eval. Plan  Current plan is for am re eval. Patient is not under full IVC at this time.   Lucrezia Starch, MD 10/06/19 [phone]

## 2019-10-06 NOTE — Discharge Instructions (Signed)
For your behavioral health needs, you are advised to continue treatment with the [facility]: ° °     [facility] ACT Team °     [facility], [address] °     [address] °     [place], [place]  [address] °     [phone] °     Crisis number: [phone] °

## 2019-10-06 NOTE — ED Notes (Signed)
Pt discharged home. Discharged instructions read to pt who verbalized understanding. All belongings returned to pt. Denies SI/HI, is not delusional and not responding to internal stimuli. Escorted pt to the ED exit.  Pt reluctant to leave because she does not have transportation. This writer attempted to call her [facility] for transport home and pt tried to call someone to pick her up. Finally able to give pt a bus pass.

## 2019-10-06 NOTE — BH Assessment (Signed)
[place] Assessment Progress Note  Per [name], NP, this pt does not require psychiatric hospitalization at this time.  Pt is to be discharged from [facility] with recommendation to continue treatment with the [facility].  This has been included in pt's discharge instructions.At [facility] request this writer called [facility] to notify them.  Call was placed at 09:54 and I spoke to [place].    Pt's nurse, Diane, has been notified.  Jalene Mullet, [place] Triage Specialist [phone]

## 2019-10-06 NOTE — Consult Note (Signed)
[facility] Psych ED Discharge  10/06/2019 9:12 AM [name]  MRN:  [id number] Principal Problem: Generalized anxiety disorder Discharge Diagnoses: Principal Problem:   Generalized anxiety disorder   Subjective: Patient is a 29 year old female that presented to the emergency department with reports of increased anxiety after finding someone that had passed away in her housing area.  Patient reports that she was told that he had died from natural causes but she stated that this was still concerning and bothering her some.  She states that she had contacted her ACT team and they informed her to come to the hospital.  Patient reports that she continues to be anxious and that she will be safe that she discharges home.  She states that she just really wants to talk to someone about it because it is difficult to deal with.  She did report having some suicidal ideations due to her increased anxiety yesterday but is doing a little bit better today.  Today she denies having any suicidal or homicidal ideations and denies any hallucinations.  Patient states that she has been current with her ACT team and she has been compliant with her medications.  She states she would like to have something that would assist her a little more with her anxiety.  Patient states that she has stable housing.  Total Time spent with patient: 30 minutes  Past Psychiatric History: Borderline personality disorder, MDD, GAD, multiple ED visits and hospitalizations. Current with [facility] services  Past Medical History:  Past Medical History:  Diagnosis Date  . Anxiety   . Bipolar 1 disorder ([place])   . Cognitive deficits   . Depression   . Diabetes mellitus without complication ([place])   . Hypertension   . Mental disorder   . Mental health disorder   . Obesity     Past Surgical History:  Procedure Laterality Date  . CESAREAN SECTION    . CESAREAN SECTION N/A 04/25/2013   Procedure: REPEAT CESAREAN SECTION;  Surgeon: [name], MD;  Location: [facility];  Service: Obstetrics;  Laterality: N/A;  . MASS EXCISION N/A 06/03/2012   Procedure: EXCISION MASS;  Surgeon: [name], MD;  Location: [place];  Service: ENT;  Laterality: N/A;  Excision uvula mass  . TONSILLECTOMY N/A 06/03/2012   Procedure: TONSILLECTOMY;  Surgeon: [name], MD;  Location: [place];  Service: ENT;  Laterality: N/A;  . TONSILLECTOMY     Family History:  Family History  Problem Relation Age of Onset  . Hypertension Mother   . Diabetes Father    Family Psychiatric  History: None reported Social History:  Social History   Substance and Sexual Activity  Alcohol Use Not Currently   Comment: occ: last intake 4 mts ago     Social History   Substance and Sexual Activity  Drug Use Not Currently  . Types: "Crack" cocaine, Other-see comments   Comment: Patient reports hx of smoking Crack    Social History   Socioeconomic History  . Marital status: Single    Spouse name: Not on file  . Number of children: Not on file  . Years of education: Not on file  . Highest education level: Not on file  Occupational History  . Not on file  Tobacco Use  . Smoking status: Current Every Day Smoker    Packs/day: 4.00    Years: 11.00    Pack years: 44.00    Types: Cigarettes  . Smokeless tobacco: Never Used  .  Tobacco comment: Pt declined  Vaping Use  . Vaping Use: Never used  Substance and Sexual Activity  . Alcohol use: Not Currently    Comment: occ: last intake 4 mts ago  . Drug use: Not Currently    Types: "Crack" cocaine, Other-see comments    Comment: Patient reports hx of smoking Crack  . Sexual activity: Yes    Birth control/protection: Implant  Other Topics Concern  . Not on file  Social History Narrative   ** Merged History Encounter **       Social Determinants of Health   Financial Resource Strain:   . Difficulty of Paying Living Expenses:   Food Insecurity:   . Worried  About Charity fundraiser in the Last Year:   . Arboriculturist in the Last Year:   Transportation Needs:   . Film/video editor (Medical):   Marland Kitchen Lack of Transportation (Non-Medical):   Physical Activity:   . Days of Exercise per Week:   . Minutes of Exercise per Session:   Stress:   . Feeling of Stress :   Social Connections:   . Frequency of Communication with Friends and Family:   . Frequency of Social Gatherings with Friends and Family:   . Attends Religious Services:   . Active Member of Clubs or Organizations:   . Attends Archivist Meetings:   Marland Kitchen Marital Status:     Has this patient used any form of tobacco in the last 30 days? (Cigarettes, Smokeless Tobacco, Cigars, and/or Pipes) A prescription for an FDA-approved tobacco cessation medication was offered at discharge and the patient refused  Current Medications: Current Facility-Administered Medications  Medication Dose Route Frequency Provider Last Rate Last Admin  . hydrOXYzine (ATARAX/VISTARIL) tablet 25 mg  25 mg Oral TID PRN Gryphon Vanderveen, Lowry Ram, FNP       Current Outpatient Medications  Medication Sig Dispense Refill  . ARIPiprazole ER (ABILIFY MAINTENA) 400 MG SRER injection Inject 2 mLs (400 mg total) into the muscle every 28 (twenty-eight) days. 1 each 11  . BANOPHEN 25 MG capsule Take 25 mg by mouth at bedtime as needed.     . cholecalciferol (VITAMIN D) 25 MCG (1000 UNIT) tablet Take 1,000 Units by mouth daily.    Marland Kitchen gabapentin (NEURONTIN) 300 MG capsule Take 300 mg by mouth 3 (three) times daily as needed.     Marland Kitchen glipiZIDE (GLUCOTROL XL) 10 MG 24 hr tablet Take 10 mg by mouth 2 (two) times daily before lunch and supper.    . hydrochlorothiazide (HYDRODIURIL) 25 MG tablet Take 25 mg by mouth daily.    Marland Kitchen lamoTRIgine (LAMICTAL) 100 MG tablet Take 100 mg by mouth daily.    Marland Kitchen LEVEMIR FLEXTOUCH 100 UNIT/ML FlexPen Inject 40 Units into the skin 2 (two) times daily.     Marland Kitchen lithium carbonate (ESKALITH) 450 MG CR  tablet Take 2 tablets (900 mg total) by mouth at bedtime. 60 tablet 2  . perphenazine (TRILAFON) 2 MG tablet Take 6 mg by mouth 2 (two) times daily.    . propranolol (INDERAL) 10 MG tablet Take 1 tablet (10 mg total) by mouth 2 (two) times daily. 60 tablet 2  . simvastatin (ZOCOR) 20 MG tablet Take 20 mg by mouth at bedtime.     PTA Medications: (Not in a hospital admission)   Musculoskeletal: Strength & Muscle Tone: within normal limits Gait & Station: normal Patient leans: N/A  Psychiatric Specialty Exam: Physical Exam Vitals and nursing  note reviewed.  Constitutional:      Appearance: She is well-developed.  Cardiovascular:     Rate and Rhythm: Normal rate.  Pulmonary:     Effort: Pulmonary effort is normal.  Musculoskeletal:        General: Normal range of motion.  Skin:    General: Skin is warm.  Neurological:     Mental Status: She is alert and oriented to person, place, and time.     Review of Systems  Constitutional: Negative.   HENT: Negative.   Eyes: Negative.   Respiratory: Negative.   Cardiovascular: Negative.   Gastrointestinal: Negative.   Genitourinary: Negative.   Musculoskeletal: Negative.   Skin: Negative.   Neurological: Negative.   Psychiatric/Behavioral: The patient is nervous/anxious.     Blood pressure 112/70, pulse 84, temperature 97.8 F (36.6 C), temperature source Oral, resp. rate 20, SpO2 95 %.There is no height or weight on file to calculate BMI.  General Appearance: Casual  Eye Contact:  Fair  Speech:  Clear and Coherent and Normal Rate  Volume:  Decreased  Mood:  Anxious  Affect:  Congruent  Thought Process:  Coherent and Descriptions of Associations: Intact  Orientation:  Full (Time, Place, and Person)  Thought Content:  WDL  Suicidal Thoughts:  No  Homicidal Thoughts:  No  Memory:  Immediate;   Fair Recent;   Good Remote;   Good  Judgement:  Fair  Insight:  Fair  Psychomotor Activity:  Normal  Concentration:   Concentration: Good  Recall:  [place] of Knowledge:  Fair  Language:  Fair  Akathisia:  No  Handed:  Right  AIMS (if indicated):     Assets:  Communication Skills Desire for Improvement Financial Resources/Insurance Housing Resilience Social Support  ADL's:  Intact  Cognition:  WNL  Sleep:      Assessment: Patient is seen via telepsych and patient is resting in bed but is awake.  Patient is pleasant, calm, cooperative.  She has fair eye contact and is open to talking about the events that happened.  She has denied any suicidal homicidal ideations and denies any hallucinations.  She does report some continued anxiety and I have agreed to start her on Vistaril 25 mg p.o. 3 times daily as needed.  She states that she is compliant with her medications and currently takes lithium, gabapentin, and Abilify maintain a.  She is followed by her ACT team and I have requested the TTS staff to contact the ACT team to notify them of her being discharged.  Patient does not meet inpatient psychiatric treatment at this time and will be discharged with a prescription of her Vistaril.  Patient does not appear to be responding to any internal or external stimuli, has not had any aggressive or threatening behavior, and has denied any suicidal or homicidal ideations.  Demographic Factors:  Low socioeconomic status  Loss Factors: NA  Historical Factors: Impulsivity  Risk Reduction Factors:   Living with another person, especially a relative, Positive social support and Positive therapeutic relationship  Continued Clinical Symptoms:  Personality Disorders:   Borderline Personality disorder  Cognitive Features That Contribute To Risk:  None    Suicide Risk:  Mild:  Suicidal ideation of limited frequency, intensity, duration, and specificity.  There are no identifiable plans, no associated intent, mild dysphoria and related symptoms, good self-control (both objective and subjective assessment), few  other risk factors, and identifiable protective factors, including available and accessible social support.    Plan  Of Care/Follow-up recommendations:  Continue activity as tolerated. Continue diet as recommended by your PCP. Ensure to keep all appointments with outpatient providers.  Disposition: Patient is instructed prior to discharge to: Take all medications as prescribed by his/her mental healthcare provider. Report any adverse effects and or reactions from the medicines to his/her outpatient provider promptly. Patient has been instructed & cautioned: To not engage in alcohol and or illegal drug use while on prescription medicines. In the event of worsening symptoms, patient is instructed to call the crisis hotline, 911 and or go to the nearest ED for appropriate evaluation and treatment of symptoms. To follow-up with his/her primary care provider for your other medical issues, concerns and or health care needs. Discharge home and follow up with [facility]. TTS staff contacting ACTT to notify oif discharge plans. Will prescribe Vistaril 25 mg PO TID PRN for anxiety to use with existing medications. Continue all home medications unless instructed otherwise by outpatient provider   Lewis Shock, FNP 10/06/2019, 9:12 AM

## 2019-10-09 ENCOUNTER — Ambulatory Visit (HOSPITAL_COMMUNITY)
Admission: EM | Admit: 2019-10-09 | Discharge: 2019-10-10 | Disposition: A | Discharge status: Other Acute Inpatient Hospital | Payer: Medicaid Other

## 2019-10-09 NOTE — ED Provider Notes (Signed)
Behavioral Health Medical Screening Exam  [name] is a 29 y.o. female.  Total Time spent with patient: 15 minutes  Psychiatric Specialty Exam  Presentation  General Appearance: No data recorded Eye Contact:Minimal  Speech:Normal Rate  Speech Volume:Increased  Handedness:No data recorded  Mood and Affect  Mood:Anxious;Labile  Affect:Labile   Thought Process  Thought Processes:Disorganized  Descriptions of Associations:Loose  Orientation:Full (Time, Place and Person)  Thought Content:Delusions;Paranoid Ideation  Hallucinations:Hallucinations: Visual  Ideas of Reference:Paranoia;Delusions  Suicidal Thoughts:Suicidal Thoughts: Yes, Active SI Active Intent and/or Plan: Without Intent;Without Plan  Homicidal Thoughts:No data recorded  Sensorium  Memory:Immediate Poor;Recent Poor  Judgment:Impaired  Insight:Lacking   Executive Functions  Concentration:Poor  Attention Span:Poor  [place]   Psychomotor Activity  Psychomotor Activity:No data recorded  Assets  Assets:No data recorded  Sleep  Sleep:No data recorded  Physical Exam: Physical Exam Constitutional:      General: She is not in acute distress.    Appearance: She is not ill-appearing, toxic-appearing or diaphoretic.  Pulmonary:     Effort: Pulmonary effort is normal. No respiratory distress.  Musculoskeletal:     Comments: Unable to assess  Neurological:     Mental Status: She is alert and oriented to person, place, and time.  Psychiatric:        Attention and Perception: She perceives visual hallucinations.        Mood and Affect: Mood is anxious. Affect is labile.        Behavior: Behavior is uncooperative and agitated.        Thought Content: Thought content is paranoid and delusional. Thought content includes suicidal ideation. Thought content does not include suicidal plan.    ROS There were no vitals taken for this visit.  There is no height or weight on file to calculate BMI.  Musculoskeletal: Strength & Muscle Tone: unable to assess Gait & Station: uable to assess Patient leans: N/A   Recommendations:  Based on my evaluation the patient does not appear to have an emergency medical condition.   Patient is paranoid, seeing "demons", extremely anxious, easily startled, screams out frequently. She is not appropriate for [facility] and will be transferred to the ED.  Rozetta Nunnery, NP 10/09/2019, 11:45 PM

## 2019-10-10 ENCOUNTER — Ambulatory Visit (HOSPITAL_COMMUNITY)
Admission: RE | Admit: 2019-10-10 | Discharge: 2019-10-10 | Disposition: A | Discharge status: Home / Self Care | Payer: Medicaid Other | Attending: Psychiatry | Admitting: Psychiatry

## 2019-10-10 ENCOUNTER — Encounter (HOSPITAL_COMMUNITY): Payer: Self-pay

## 2019-10-10 ENCOUNTER — Ambulatory Visit (HOSPITAL_COMMUNITY)
Admission: EM | Admit: 2019-10-10 | Discharge: 2019-10-11 | Disposition: A | Discharge status: Home / Self Care | Payer: Medicaid Other | Source: Home / Self Care | Attending: Psychiatry | Admitting: Psychiatry

## 2019-10-10 ENCOUNTER — Other Ambulatory Visit: Payer: Self-pay

## 2019-10-10 ENCOUNTER — Emergency Department (HOSPITAL_COMMUNITY)
Admission: EM | Admit: 2019-10-10 | Discharge: 2019-10-10 | Disposition: A | Discharge status: Other Acute Inpatient Hospital | Payer: Medicaid Other | Attending: Emergency Medicine | Admitting: Emergency Medicine

## 2019-10-10 DIAGNOSIS — R4182 Altered mental status, unspecified: Secondary | ICD-10-CM | POA: Diagnosis not present

## 2019-10-10 DIAGNOSIS — R45851 Suicidal ideations: Secondary | ICD-10-CM | POA: Diagnosis not present

## 2019-10-10 DIAGNOSIS — F411 Generalized anxiety disorder: Secondary | ICD-10-CM

## 2019-10-10 DIAGNOSIS — Z79899 Other long term (current) drug therapy: Secondary | ICD-10-CM | POA: Insufficient documentation

## 2019-10-10 DIAGNOSIS — Z20822 Contact with and (suspected) exposure to covid-19: Secondary | ICD-10-CM | POA: Diagnosis not present

## 2019-10-10 DIAGNOSIS — F200 Paranoid schizophrenia: Secondary | ICD-10-CM

## 2019-10-10 DIAGNOSIS — F819 Developmental disorder of scholastic skills, unspecified: Secondary | ICD-10-CM

## 2019-10-10 DIAGNOSIS — F99 Mental disorder, not otherwise specified: Secondary | ICD-10-CM | POA: Diagnosis not present

## 2019-10-10 DIAGNOSIS — F419 Anxiety disorder, unspecified: Secondary | ICD-10-CM | POA: Insufficient documentation

## 2019-10-10 DIAGNOSIS — R4189 Other symptoms and signs involving cognitive functions and awareness: Secondary | ICD-10-CM

## 2019-10-10 DIAGNOSIS — Z7984 Long term (current) use of oral hypoglycemic drugs: Secondary | ICD-10-CM | POA: Insufficient documentation

## 2019-10-10 DIAGNOSIS — Z046 Encounter for general psychiatric examination, requested by authority: Secondary | ICD-10-CM | POA: Diagnosis present

## 2019-10-10 DIAGNOSIS — F17210 Nicotine dependence, cigarettes, uncomplicated: Secondary | ICD-10-CM | POA: Insufficient documentation

## 2019-10-10 DIAGNOSIS — I10 Essential (primary) hypertension: Secondary | ICD-10-CM | POA: Insufficient documentation

## 2019-10-10 DIAGNOSIS — E119 Type 2 diabetes mellitus without complications: Secondary | ICD-10-CM | POA: Insufficient documentation

## 2019-10-10 DIAGNOSIS — F250 Schizoaffective disorder, bipolar type: Secondary | ICD-10-CM | POA: Diagnosis not present

## 2019-10-10 DIAGNOSIS — F639 Impulse disorder, unspecified: Secondary | ICD-10-CM

## 2019-10-10 DIAGNOSIS — F312 Bipolar disorder, current episode manic severe with psychotic features: Secondary | ICD-10-CM

## 2019-10-10 LAB — HEMOGLOBIN A1C
Hgb A1c MFr Bld: 6.3 % — ABNORMAL HIGH (ref 4.8–5.6)
Mean Plasma Glucose: 134.11 mg/dL

## 2019-10-10 LAB — COMPREHENSIVE METABOLIC PANEL
ALT: 20 U/L (ref 0–44)
AST: 21 U/L (ref 15–41)
Albumin: 4.4 g/dL (ref 3.5–5.0)
Alkaline Phosphatase: 33 U/L — ABNORMAL LOW (ref 38–126)
Anion gap: 12 (ref 5–15)
BUN: 13 mg/dL (ref 6–20)
CO2: 24 mmol/L (ref 22–32)
Calcium: 9.3 mg/dL (ref 8.9–10.3)
Chloride: 105 mmol/L (ref 98–111)
Creatinine, Ser: 0.97 mg/dL (ref 0.44–1.00)
GFR calc Af Amer: >60 mL/min (ref >60)
GFR calc non Af Amer: >60 mL/min (ref >60)
Glucose, Bld: 102 mg/dL — ABNORMAL HIGH (ref 70–99)
Potassium: 3.7 mmol/L (ref 3.5–5.1)
Sodium: 141 mmol/L (ref 135–145)
Total Bilirubin: 0.7 mg/dL (ref 0.3–1.2)
Total Protein: 7.8 g/dL (ref 6.5–8.1)

## 2019-10-10 LAB — CBC WITH DIFFERENTIAL/PLATELET
Abs Immature Granulocytes: 0.05 10*3/uL (ref 0.00–0.07)
Basophils Absolute: 0.1 10*3/uL (ref 0.0–0.1)
Basophils Relative: 1 %
Eosinophils Absolute: 0.5 10*3/uL (ref 0.0–0.5)
Eosinophils Relative: 4 %
HCT: 39.9 % (ref 36.0–46.0)
Hemoglobin: 12.2 g/dL (ref 12.0–15.0)
Immature Granulocytes: 0 %
Lymphocytes Relative: 30 %
Lymphs Abs: 3.6 10*3/uL (ref 0.7–4.0)
MCH: 26.7 pg (ref 26.0–34.0)
MCHC: 30.6 g/dL (ref 30.0–36.0)
MCV: 87.3 fL (ref 80.0–100.0)
Monocytes Absolute: 0.8 10*3/uL (ref 0.1–1.0)
Monocytes Relative: 7 %
Neutro Abs: 6.9 10*3/uL (ref 1.7–7.7)
Neutrophils Relative %: 58 %
Platelets: 217 10*3/uL (ref 150–400)
RBC: 4.57 MIL/uL (ref 3.87–5.11)
RDW: 15.3 % (ref 11.5–15.5)
WBC: 12 10*3/uL — ABNORMAL HIGH (ref 4.0–10.5)
nRBC: 0 % (ref 0.0–0.2)

## 2019-10-10 LAB — SALICYLATE LEVEL: Salicylate Lvl: <7 mg/dL — ABNORMAL LOW (ref 7.0–30.0)

## 2019-10-10 LAB — ETHANOL: Alcohol, Ethyl (B): <10 mg/dL (ref <10)

## 2019-10-10 LAB — LITHIUM LEVEL: Lithium Lvl: 0.78 mmol/L (ref 0.60–1.20)

## 2019-10-10 LAB — CBG MONITORING, ED
Glucose-Capillary: 115 mg/dL — ABNORMAL HIGH (ref 70–99)
Glucose-Capillary: 139 mg/dL — ABNORMAL HIGH (ref 70–99)

## 2019-10-10 LAB — SARS CORONAVIRUS 2 BY RT PCR (HOSPITAL ORDER, PERFORMED IN ~~LOC~~ HOSPITAL LAB): SARS Coronavirus 2: NEGATIVE

## 2019-10-10 LAB — GLUCOSE, CAPILLARY: Glucose-Capillary: 126 mg/dL — ABNORMAL HIGH (ref 70–99)

## 2019-10-10 LAB — ACETAMINOPHEN LEVEL: Acetaminophen (Tylenol), Serum: <10 ug/mL — ABNORMAL LOW (ref 10–30)

## 2019-10-10 MED ORDER — SIMVASTATIN 20 MG PO TABS: 20.0000 mg | ORAL_TABLET | Freq: Every day | ORAL | Status: DC

## 2019-10-10 MED ORDER — ZIPRASIDONE MESYLATE 20 MG IM SOLR
20.0000 mg | Freq: Once | INTRAMUSCULAR | Status: AC
  Administered 2019-10-10: 20 mg via INTRAMUSCULAR
  Filled 2019-10-10: qty 20

## 2019-10-10 MED ORDER — LITHIUM CARBONATE ER 450 MG PO TBCR
900.0000 mg | EXTENDED_RELEASE_TABLET | Freq: Every day | ORAL | Status: DC
  Administered 2019-10-10: 900 mg via ORAL
  Filled 2019-10-10: qty 2

## 2019-10-10 MED ORDER — GABAPENTIN 300 MG PO CAPS
300.0000 mg | ORAL_CAPSULE | Freq: Three times a day (TID) | ORAL | Status: DC
  Administered 2019-10-10: 300 mg via ORAL
  Filled 2019-10-10: qty 1

## 2019-10-10 MED ORDER — ACETAMINOPHEN 325 MG PO TABS: 650.0000 mg | ORAL_TABLET | Freq: Four times a day (QID) | ORAL | Status: DC | PRN

## 2019-10-10 MED ORDER — VITAMIN D3 25 MCG (1000 UNIT) PO TABS
1000.0000 [IU] | ORAL_TABLET | Freq: Every day | ORAL | Status: DC
  Administered 2019-10-10 – 2019-10-11 (×2): 1000 [IU] via ORAL
  Filled 2019-10-10 (×5): qty 1

## 2019-10-10 MED ORDER — GABAPENTIN 300 MG PO CAPS
300.0000 mg | ORAL_CAPSULE | Freq: Three times a day (TID) | ORAL | Status: DC | PRN
  Administered 2019-10-10: 300 mg via ORAL
  Filled 2019-10-10: qty 1

## 2019-10-10 MED ORDER — HYDROXYZINE HCL 25 MG PO TABS: 25.0000 mg | ORAL_TABLET | Freq: Three times a day (TID) | ORAL | Status: DC | PRN

## 2019-10-10 MED ORDER — LAMOTRIGINE 100 MG PO TABS
100.0000 mg | ORAL_TABLET | Freq: Every day | ORAL | Status: DC
  Administered 2019-10-10 – 2019-10-11 (×2): 100 mg via ORAL
  Filled 2019-10-10 (×2): qty 1

## 2019-10-10 MED ORDER — SIMVASTATIN 20 MG PO TABS
20.0000 mg | ORAL_TABLET | Freq: Every day | ORAL | Status: DC
  Administered 2019-10-10: 20 mg via ORAL
  Filled 2019-10-10: qty 1

## 2019-10-10 MED ORDER — GLIPIZIDE ER 2.5 MG PO TB24
10.0000 mg | ORAL_TABLET | Freq: Two times a day (BID) | ORAL | Status: DC
  Filled 2019-10-10: qty 4

## 2019-10-10 MED ORDER — GLIPIZIDE ER 10 MG PO TB24
10.0000 mg | ORAL_TABLET | Freq: Two times a day (BID) | ORAL | Status: DC
  Filled 2019-10-10 (×2): qty 1

## 2019-10-10 MED ORDER — INSULIN DETEMIR 100 UNIT/ML ~~LOC~~ SOLN: 40.0000 [IU] | Freq: Two times a day (BID) | SUBCUTANEOUS | Status: DC

## 2019-10-10 MED ORDER — HYDROCHLOROTHIAZIDE 25 MG PO TABS
25.0000 mg | ORAL_TABLET | Freq: Every day | ORAL | Status: DC
  Administered 2019-10-10: 25 mg via ORAL
  Filled 2019-10-10: qty 1

## 2019-10-10 MED ORDER — LITHIUM CARBONATE ER 450 MG PO TBCR: 900.0000 mg | EXTENDED_RELEASE_TABLET | Freq: Every day | ORAL | Status: DC

## 2019-10-10 MED ORDER — PROPRANOLOL HCL 20 MG PO TABS
10.0000 mg | ORAL_TABLET | Freq: Two times a day (BID) | ORAL | Status: DC
  Administered 2019-10-10: 10 mg via ORAL
  Filled 2019-10-10: qty 1

## 2019-10-10 MED ORDER — HYDROXYZINE HCL 25 MG PO TABS
25.0000 mg | ORAL_TABLET | Freq: Three times a day (TID) | ORAL | Status: DC | PRN
  Administered 2019-10-10 – 2019-10-11 (×2): 25 mg via ORAL
  Filled 2019-10-10 (×3): qty 1

## 2019-10-10 MED ORDER — PERPHENAZINE 4 MG PO TABS: 6.0000 mg | ORAL_TABLET | Freq: Two times a day (BID) | ORAL | Status: DC

## 2019-10-10 MED ORDER — LAMOTRIGINE 100 MG PO TABS
100.0000 mg | ORAL_TABLET | Freq: Every day | ORAL | Status: DC
  Administered 2019-10-10: 100 mg via ORAL
  Filled 2019-10-10: qty 1

## 2019-10-10 MED ORDER — STERILE WATER FOR INJECTION IJ SOLN
INTRAMUSCULAR | Status: AC
  Filled 2019-10-10: qty 10

## 2019-10-10 MED ORDER — INSULIN ASPART 100 UNIT/ML ~~LOC~~ SOLN
0.0000 [IU] | Freq: Three times a day (TID) | SUBCUTANEOUS | Status: DC
  Administered 2019-10-11: 2 [IU] via SUBCUTANEOUS

## 2019-10-10 MED ORDER — INSULIN ASPART 100 UNIT/ML ~~LOC~~ SOLN
0.0000 [IU] | Freq: Three times a day (TID) | SUBCUTANEOUS | Status: DC
  Administered 2019-10-10: 1 [IU] via SUBCUTANEOUS
  Filled 2019-10-10: qty 0.09

## 2019-10-10 MED ORDER — LORAZEPAM 2 MG/ML IJ SOLN
2.0000 mg | Freq: Once | INTRAMUSCULAR | Status: AC
  Administered 2019-10-10: 2 mg via INTRAMUSCULAR
  Filled 2019-10-10: qty 1

## 2019-10-10 MED ORDER — PROPRANOLOL HCL 10 MG PO TABS
10.0000 mg | ORAL_TABLET | Freq: Two times a day (BID) | ORAL | Status: DC
  Administered 2019-10-10 – 2019-10-11 (×2): 10 mg via ORAL
  Filled 2019-10-10 (×2): qty 1

## 2019-10-10 NOTE — ED Triage Notes (Signed)
Pt [facility] EMS [facility] after a resident died and she saw his dead body. Pt anxious and yelling.

## 2019-10-10 NOTE — ED Notes (Signed)
Attempted to call transportation for the patient to behavior health. But no one answer.
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
## 2019-10-10 NOTE — ED Notes (Signed)
Patient belongings accepted and placed in locker 25. Patient assigned bed 1

## 2019-10-10 NOTE — ED Notes (Signed)
Patient sitting up in chair suspicious of others. Reassured pt of her safety. Pt states, "Are they talking about me. I get paranoid". Reassurance given. Staff offered snacks fluids. Patient accepted. Oriented pt to unit. Patient states, "It feel like bugs crawling on my skin because I'm OCD about shaving my head". Pt on telephone, cooperative with staff. PRN Vistaril given along with scheduled medications. Pt tolerated well. Will continue to monitor.

## 2019-10-10 NOTE — ED Notes (Signed)
Pt A&O x 4, resting at present, no distress noted, calm & cooperative, pt remains passive SI, contracts for safety.
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
## 2019-10-10 NOTE — ED Notes (Signed)
Patient is resting comfortably. 

## 2019-10-10 NOTE — ED Notes (Signed)
Patient Belongings in locker #1 as they are admitted for Continuous Assessment (CA).

## 2019-10-10 NOTE — ED Provider Notes (Signed)
Behavioral Health Admission H&P [facility] & OBS)  Date: 10/10/2019 Patient Name: [name] MRN: [id number] Chief Complaint:  Chief Complaint  Patient presents with   Anxiety    "My anxiety is up really bad"   Paranoid    "My house is haunted. 3 people died in there but 2 died in the same bedroom"      Diagnoses:  Final diagnoses:  Paranoid schizophrenia ([employer])  Impulse control disorder  Learning disability  Generalized anxiety disorder  Cognitive deficits    HPI: Patient presenting to continuous observation from [facility] with complaints of paranoia and feeling that the home she is living in is creepy and haunted.  Unable to sleep at night related to being scared.  Will restart home medications, monitor overnight for safety and stabilization.    PHQ 2-9: Denies depression  Total Time spent with patient: 30 minutes  Musculoskeletal  Strength & Muscle Tone: within normal limits Gait & Station: normal Patient leans: N/A  Psychiatric Specialty Exam  Presentation General Appearance: No data recorded Eye Contact:Good  Speech:Clear and Coherent;Normal Rate  Speech Volume:Normal  Handedness:Right   Mood and Affect  Mood:Anxious  Affect:Congruent   Thought Process  Thought Processes:Coherent;Goal Directed  Descriptions of Associations:Tangential  Orientation:Full (Time, Place and Person)  Thought Content:Paranoid Ideation  Hallucinations:Hallucinations: None  Ideas of Reference:Paranoia  Suicidal Thoughts:Suicidal Thoughts: No SI Active Intent and/or Plan: Without Intent;Without Plan  Homicidal Thoughts:No data recorded  Sensorium  Memory:Immediate Good;Recent Good;Remote Fair  Judgment:Fair  Insight:Fair;Present   Executive Functions  Concentration:Fair  Attention Span:Fair  [place]   Psychomotor Activity  Psychomotor Activity:Psychomotor Activity: Normal   Assets  Assets:Communication  Skills;Desire for Improvement;Housing;Leisure Time   Sleep  Sleep:Sleep: Fair (reporting scared to sleep related to the man that died in boarding home and house being haunted)   Physical Exam ROS  Blood pressure 121/85, pulse 85, temperature 98.7 F (37.1 C), temperature source Oral, resp. rate 18, height 5\' 7"  (1.702 m), weight 260 lb (117.9 kg), SpO2 100 %. Body mass index is 40.72 kg/m.  Past Psychiatric History: Schizophrenia, bipolar disorder, depression, anxiety   Is the patient at risk to self? No  Has the patient been a risk to self in the past 6 months? No .    Has the patient been a risk to self within the distant past? Yes   Is the patient a risk to others? No   Has the patient been a risk to others in the past 6 months? No   Has the patient been a risk to others within the distant past? No   Past Medical History:  Past Medical History:  Diagnosis Date   Anxiety    Bipolar 1 disorder ([place])    Cognitive deficits    Depression    Diabetes mellitus without complication ([place])    Hypertension    Mental disorder    Mental health disorder    Obesity     Past Surgical History:  Procedure Laterality Date   CESAREAN SECTION     CESAREAN SECTION N/A 04/25/2013   Procedure: REPEAT CESAREAN SECTION;  Surgeon: [name], MD;  Location: [facility];  Service: Obstetrics;  Laterality: N/A;   MASS EXCISION N/A 06/03/2012   Procedure: EXCISION MASS;  Surgeon: [name], MD;  Location: [place];  Service: ENT;  Laterality: N/A;  Excision uvula mass   TONSILLECTOMY N/A 06/03/2012   Procedure: TONSILLECTOMY;  Surgeon: [name], MD;  Location:  [place];  Service: ENT;  Laterality: N/A;   TONSILLECTOMY      Family History:  Family History  Problem Relation Age of Onset   Hypertension Mother    Diabetes Father     Social History:  Social History   Socioeconomic History   Marital status: Single    Spouse name:  Not on file   Number of children: Not on file   Years of education: Not on file   Highest education level: Not on file  Occupational History   Not on file  Tobacco Use   Smoking status: Current Every Day Smoker    Packs/day: 4.00    Years: 11.00    Pack years: 44.00    Types: Cigarettes   Smokeless tobacco: Never Used   Tobacco comment: Pt declined  Vaping Use   Vaping Use: Never used  Substance and Sexual Activity   Alcohol use: Not Currently    Comment: occ: last intake 4 mts ago   Drug use: Not Currently    Types: "Crack" cocaine, Other-see comments    Comment: Patient reports hx of smoking Crack   Sexual activity: Yes    Birth control/protection: Implant  Other Topics Concern   Not on file  Social History Narrative   ** Merged History Encounter **       Social Determinants of Health   Financial Resource Strain:    Difficulty of Paying Living Expenses:   Food Insecurity:    Worried About Charity fundraiser in the Last Year:    Arboriculturist in the Last Year:   Transportation Needs:    Film/video editor (Medical):    Lack of Transportation (Non-Medical):   Physical Activity:    Days of Exercise per Week:    Minutes of Exercise per Session:   Stress:    Feeling of Stress :   Social Connections:    Frequency of Communication with Friends and Family:    Frequency of Social Gatherings with Friends and Family:    Attends Religious Services:    Active Member of Clubs or Organizations:    Attends Music therapist:    Marital Status:   Intimate Partner Violence:    Fear of Current or Ex-Partner:    Emotionally Abused:    Physically Abused:    Sexually Abused:     SDOH:  SDOH Screenings   Alcohol Screen: Low Risk    Last Alcohol Screening Score (AUDIT): 0  Depression (PHQ2-9): Low Risk    PHQ-2 Score: 0  Financial Resource Strain:    Difficulty of Paying Living Expenses:   Food Insecurity:    Worried  About Charity fundraiser in the Last Year:    Arboriculturist in the Last Year:   Housing:    Last Housing Risk Score:   Physical Activity:    Days of Exercise per Week:    Minutes of Exercise per Session:   Social Connections:    Frequency of Communication with Friends and Family:    Frequency of Social Gatherings with Friends and Family:    Attends Religious Services:    Active Member of Clubs or Organizations:    Attends Music therapist:    Marital Status:   Stress:    Feeling of Stress :   Tobacco Use: High Risk   Smoking Tobacco Use: Current Every Day Smoker   Smokeless Tobacco Use: Never Used  Transportation Needs:  Lack of Transportation (Medical):    Lack of Transportation (Non-Medical):     Last Labs:  Admission on 10/10/2019, Discharged on 10/10/2019  Component Date Value Ref Range Status   WBC 10/10/2019 12.0* 4.0 - 10.5 K/uL Final   RBC 10/10/2019 4.57  3.87 - 5.11 MIL/uL Final   Hemoglobin 10/10/2019 12.2  12.0 - 15.0 g/dL Final   HCT 10/10/2019 39.9  36 - 46 % Final   MCV 10/10/2019 87.3  80.0 - 100.0 fL Final   MCH 10/10/2019 26.7  26.0 - 34.0 pg Final   MCHC 10/10/2019 30.6  30.0 - 36.0 g/dL Final   RDW 10/10/2019 15.3  11.5 - 15.5 % Final   Platelets 10/10/2019 217  150 - 400 K/uL Final   nRBC 10/10/2019 0.0  0.0 - 0.2 % Final   Neutrophils Relative % 10/10/2019 58  % Final   Neutro Abs 10/10/2019 6.9  1.7 - 7.7 K/uL Final   Lymphocytes Relative 10/10/2019 30  % Final   Lymphs Abs 10/10/2019 3.6  0.7 - 4.0 K/uL Final   Monocytes Relative 10/10/2019 7  % Final   Monocytes Absolute 10/10/2019 0.8  0 - 1 K/uL Final   Eosinophils Relative 10/10/2019 4  % Final   Eosinophils Absolute 10/10/2019 0.5  0 - 0 K/uL Final   Basophils Relative 10/10/2019 1  % Final   Basophils Absolute 10/10/2019 0.1  0 - 0 K/uL Final   Immature Granulocytes 10/10/2019 0  % Final   Abs Immature Granulocytes 10/10/2019 0.05   0.00 - 0.07 K/uL Final   Performed at [facility], [place] [address], [place], [place] [address]   Sodium 10/10/2019 141  135 - 145 mmol/L Final   Potassium 10/10/2019 3.7  3.5 - 5.1 mmol/L Final   Chloride 10/10/2019 105  98 - 111 mmol/L Final   CO2 10/10/2019 24  22 - 32 mmol/L Final   Glucose, Bld 10/10/2019 102* 70 - 99 mg/dL Final   Glucose reference range applies only to samples taken after fasting for at least 8 hours.   BUN 10/10/2019 13  6 - 20 mg/dL Final   Creatinine, Ser 10/10/2019 0.97  0.44 - 1.00 mg/dL Final   Calcium 10/10/2019 9.3  8.9 - 10.3 mg/dL Final   Total Protein 10/10/2019 7.8  6.5 - 8.1 g/dL Final   Albumin 10/10/2019 4.4  3.5 - 5.0 g/dL Final   AST 10/10/2019 21  15 - 41 U/L Final   ALT 10/10/2019 20  0 - 44 U/L Final   Alkaline Phosphatase 10/10/2019 33* 38 - 126 U/L Final   Total Bilirubin 10/10/2019 0.7  0.3 - 1.2 mg/dL Final   GFR calc non Af Amer 10/10/2019 >60  >60 mL/min Final   GFR calc Af Amer 10/10/2019 >60  >60 mL/min Final   Anion gap 10/10/2019 12  5 - 15 Final   Performed at [facility], [place] [address] [place], [place] [address]   Alcohol, Ethyl (B) 10/10/2019 <10  <10 mg/dL Final   Comment: (NOTE) Lowest detectable limit for serum alcohol is 10 mg/dL.  For medical purposes only. Performed at [facility], [place] [address] [place], [place] [address]    Acetaminophen (Tylenol), Serum 10/10/2019 <10* 10 - 30 ug/mL Final   Comment: (NOTE) Therapeutic concentrations vary significantly. A range of 10-30 ug/mL  may be an effective concentration for many patients. However, some  are best treated at concentrations outside of this range. Acetaminophen concentrations >  150 ug/mL at 4 hours after ingestion  and >50 ug/mL at 12 hours after ingestion are often associated with  toxic reactions.  Performed at [facility], [place] [address] [place], [place]  [address]    Salicylate Lvl 32/44/0102 <7.0* 7.0 - 30.0 mg/dL Final   Performed at [place] [address] [place], [place] [address]   Lithium Lvl 10/10/2019 0.78  0.60 - 1.20 mmol/L Final   Performed at [facility] [address], [place], [place] [address]   SARS Coronavirus 2 10/10/2019 NEGATIVE  NEGATIVE Final   Comment: (NOTE) SARS-CoV-2 target nucleic acids are NOT DETECTED.  The SARS-CoV-2 RNA is generally detectable in upper and lower respiratory specimens during the acute phase of infection. The lowest concentration of SARS-CoV-2 viral copies this assay can detect is 250 copies / mL. A negative result does not preclude SARS-CoV-2 infection and should not be used as the sole basis for treatment or other patient management decisions.  A negative result may occur with improper specimen collection / handling, submission of specimen other than nasopharyngeal swab, presence of viral mutation(s) within the areas targeted by this assay, and inadequate number of viral copies (<250 copies / mL). A negative result must be combined with clinical observations, patient history, and epidemiological information.  Fact Sheet for Patients:   StrictlyIdeas.no  Fact Sheet for Healthcare Providers: BankingDealers.co.za  This test is not yet approved or                           cleared by the [place] FDA and has been authorized for detection and/or diagnosis of SARS-CoV-2 by FDA under an Emergency Use Authorization (EUA).  This EUA will remain in effect (meaning this test can be used) for the duration of the COVID-19 declaration under Section 564(b)(1) of the Act, 21 U.S.C. section 360bbb-3(b)(1), unless the authorization is terminated or revoked sooner.  Performed at [facility], [place] [address] [place], [place] [address]    Hgb A1c MFr Bld 10/10/2019 6.3* 4.8 - 5.6 %  Final   Comment: (NOTE) Pre diabetes:          5.7%-6.4%  Diabetes:              >6.4%  Glycemic control for   <7.0% adults with diabetes    Mean Plasma Glucose 10/10/2019 134.11  mg/dL Final   Performed at [facility], [place] [address], [place], [place] [address]   Glucose-Capillary 10/10/2019 115* 70 - 99 mg/dL Final   Glucose reference range applies only to samples taken after fasting for at least 8 hours.   Glucose-Capillary 10/10/2019 139* 70 - 99 mg/dL Final   Glucose reference range applies only to samples taken after fasting for at least 8 hours.  Admission on 10/05/2019, Discharged on 10/06/2019  Component Date Value Ref Range Status   SARS Coronavirus 2 10/05/2019 NEGATIVE  NEGATIVE Final   Comment: (NOTE) SARS-CoV-2 target nucleic acids are NOT DETECTED.  The SARS-CoV-2 RNA is generally detectable in upper and lower respiratory specimens during the acute phase of infection. The lowest concentration of SARS-CoV-2 viral copies this assay can detect is 250 copies / mL. A negative result does not preclude SARS-CoV-2 infection and should not be used as the sole basis for treatment or other patient management decisions.  A negative result may occur with improper specimen collection / handling, submission of specimen other than nasopharyngeal  swab, presence of viral mutation(s) within the areas targeted by this assay, and inadequate number of viral copies (<250 copies / mL). A negative result must be combined with clinical observations, patient history, and epidemiological information.  Fact Sheet for Patients:   StrictlyIdeas.no  Fact Sheet for Healthcare Providers: BankingDealers.co.za  This test is not yet approved or                           cleared by the [place] FDA and has been authorized for detection and/or diagnosis of SARS-CoV-2 by FDA under an Emergency Use Authorization (EUA).  This EUA will  remain in effect (meaning this test can be used) for the duration of the COVID-19 declaration under Section 564(b)(1) of the Act, 21 U.S.C. section 360bbb-3(b)(1), unless the authorization is terminated or revoked sooner.  Performed at [facility], [place] [address], [place], [place] [address]    I-stat hCG, quantitative 10/05/2019 <5.0  <5 mIU/mL Final   Comment 3 10/05/2019          Final   Comment:   GEST. AGE      CONC.  (mIU/mL)   <=1 WEEK        5 - 50     2 WEEKS       50 - 500     3 WEEKS       100 - 10,000     4 WEEKS     1,000 - 30,000        FEMALE AND NON-PREGNANT FEMALE:     LESS THAN 5 mIU/mL   Admission on 09/12/2019, Discharged on 09/13/2019  Component Date Value Ref Range Status   SARS Coronavirus 2 09/12/2019 NEGATIVE  NEGATIVE Final   Comment: (NOTE) SARS-CoV-2 target nucleic acids are NOT DETECTED. The SARS-CoV-2 RNA is generally detectable in upper and lower respiratory specimens during the acute phase of infection. The lowest concentration of SARS-CoV-2 viral copies this assay can detect is 250 copies / mL. A negative result does not preclude SARS-CoV-2 infection and should not be used as the sole basis for treatment or other patient management decisions.  A negative result may occur with improper specimen collection / handling, submission of specimen other than nasopharyngeal swab, presence of viral mutation(s) within the areas targeted by this assay, and inadequate number of viral copies (<250 copies / mL). A negative result must be combined with clinical observations, patient history, and epidemiological information. Fact Sheet for Patients:   StrictlyIdeas.no Fact Sheet for Healthcare Providers: BankingDealers.co.za This test is not yet approved or cleared                           by the [place] FDA and has been authorized for detection and/or diagnosis of SARS-CoV-2 by FDA  under an Emergency Use Authorization (EUA).  This EUA will remain in effect (meaning this test can be used) for the duration of the COVID-19 declaration under Section 564(b)(1) of the Act, 21 U.S.C. section 360bbb-3(b)(1), unless the authorization is terminated or revoked sooner. Performed at [facility], [place] [address], [place], [place] [address]    Glucose-Capillary 09/13/2019 195* 70 - 99 mg/dL Final   Glucose reference range applies only to samples taken after fasting for at least 8 hours.  Admission on 08/25/2019, Discharged on 08/25/2019  Component Date Value Ref Range Status   Color, Urine 08/25/2019 YELLOW  YELLOW  Final   APPearance 08/25/2019 CLEAR  CLEAR Final   Specific Gravity, Urine 08/25/2019 1.024  1.005 - 1.030 Final   pH 08/25/2019 5.0  5.0 - 8.0 Final   Glucose, UA 08/25/2019 NEGATIVE  NEGATIVE mg/dL Final   Hgb urine dipstick 08/25/2019 NEGATIVE  NEGATIVE Final   Bilirubin Urine 08/25/2019 NEGATIVE  NEGATIVE Final   Ketones, ur 08/25/2019 NEGATIVE  NEGATIVE mg/dL Final   Protein, ur 08/25/2019 NEGATIVE  NEGATIVE mg/dL Final   Nitrite 08/25/2019 NEGATIVE  NEGATIVE Final   Leukocytes,Ua 08/25/2019 NEGATIVE  NEGATIVE Final   Performed at [place] [address] [place], [place] [address]   Preg Test, Ur 08/25/2019 NEGATIVE  NEGATIVE Final   Comment:        THE SENSITIVITY OF THIS METHODOLOGY IS >20 mIU/mL. Performed at [facility], [place] [address] [place], [place] [address]   Admission on 08/16/2019, Discharged on 08/17/2019  Component Date Value Ref Range Status   Sodium 08/16/2019 139  135 - 145 mmol/L Final   Potassium 08/16/2019 3.0* 3.5 - 5.1 mmol/L Final   Chloride 08/16/2019 101  98 - 111 mmol/L Final   CO2 08/16/2019 27  22 - 32 mmol/L Final   Glucose, Bld 08/16/2019 160* 70 - 99 mg/dL Final   Glucose reference range applies only to samples taken after fasting for at least 8  hours.   BUN 08/16/2019 7  6 - 20 mg/dL Final   Creatinine, Ser 08/16/2019 0.89  0.44 - 1.00 mg/dL Final   Calcium 08/16/2019 9.4  8.9 - 10.3 mg/dL Final   GFR calc non Af Amer 08/16/2019 >60  >60 mL/min Final   GFR calc Af Amer 08/16/2019 >60  >60 mL/min Final   Anion gap 08/16/2019 11  5 - 15 Final   Performed at [facility], [place] [address] [place], [place] [address]   WBC 08/16/2019 13.8* 4.0 - 10.5 K/uL Final   RBC 08/16/2019 4.89  3.87 - 5.11 MIL/uL Final   Hemoglobin 08/16/2019 12.6  12.0 - 15.0 g/dL Final   HCT 08/16/2019 40.8  36 - 46 % Final   MCV 08/16/2019 83.4  80.0 - 100.0 fL Final   MCH 08/16/2019 25.8* 26.0 - 34.0 pg Final   MCHC 08/16/2019 30.9  30.0 - 36.0 g/dL Final   RDW 08/16/2019 14.3  11.5 - 15.5 % Final   Platelets 08/16/2019 252  150 - 400 K/uL Final   REPEATED TO VERIFY   nRBC 08/16/2019 0.0  0.0 - 0.2 % Final   Performed at [facility], [place] [address] [place], [place] [address]   Troponin I (High Sensitivity) 08/16/2019 5  <18 ng/L Final   Comment: (NOTE) Elevated high sensitivity troponin I (hsTnI) values and significant  changes across serial measurements may suggest ACS but many other  chronic and acute conditions are known to elevate hsTnI results.  Refer to the "Links" section for chest pain algorithms and additional  guidance. Performed at [facility], [place] [address], [place], [place] [address]    I-stat hCG, quantitative 08/16/2019 <5.0  <5 mIU/mL Final   Comment 3 08/16/2019          Final   Comment:   GEST. AGE      CONC.  (mIU/mL)   <=1 WEEK        5 - 50     2 WEEKS       50 - 500  3 WEEKS       100 - 10,000     4 WEEKS     1,000 - 30,000        FEMALE AND NON-PREGNANT FEMALE:     LESS THAN 5 mIU/mL    Troponin I (High Sensitivity) 08/16/2019 4  <18 ng/L Final   Comment: (NOTE) Elevated high sensitivity troponin I (hsTnI) values and significant  changes across serial measurements may suggest  ACS but many other  chronic and acute conditions are known to elevate hsTnI results.  Refer to the "Links" section for chest pain algorithms and additional  guidance. Performed at [facility], [place] [address], [place], [place] [address]   Admission on 07/29/2019, Discharged on 07/30/2019  Component Date Value Ref Range Status   SARS Coronavirus 2 by RT PCR 07/29/2019 NEGATIVE  NEGATIVE Final   Comment: (NOTE) SARS-CoV-2 target nucleic acids are NOT DETECTED. The SARS-CoV-2 RNA is generally detectable in upper respiratoy specimens during the acute phase of infection. The lowest concentration of SARS-CoV-2 viral copies this assay can detect is 131 copies/mL. A negative result does not preclude SARS-Cov-2 infection and should not be used as the sole basis for treatment or other patient management decisions. A negative result may occur with  improper specimen collection/handling, submission of specimen other than nasopharyngeal swab, presence of viral mutation(s) within the areas targeted by this assay, and inadequate number of viral copies (<131 copies/mL). A negative result must be combined with clinical observations, patient history, and epidemiological information. The expected result is Negative. Fact Sheet for Patients:  PinkCheek.be Fact Sheet for Healthcare Providers:  GravelBags.it This test is not yet ap                          proved or cleared by the [place] FDA and  has been authorized for detection and/or diagnosis of SARS-CoV-2 by FDA under an Emergency Use Authorization (EUA). This EUA will remain  in effect (meaning this test can be used) for the duration of the COVID-19 declaration under Section 564(b)(1) of the Act, 21 U.S.C. section 360bbb-3(b)(1), unless the authorization is terminated or revoked sooner.    Influenza A by PCR 07/29/2019 NEGATIVE  NEGATIVE Final   Influenza B by PCR  07/29/2019 NEGATIVE  NEGATIVE Final   Comment: (NOTE) The Xpert Xpress SARS-CoV-2/FLU/RSV assay is intended as an aid in  the diagnosis of influenza from Nasopharyngeal swab specimens and  should not be used as a sole basis for treatment. Nasal washings and  aspirates are unacceptable for Xpert Xpress SARS-CoV-2/FLU/RSV  testing. Fact Sheet for Patients: PinkCheek.be Fact Sheet for Healthcare Providers: GravelBags.it This test is not yet approved or cleared by the [place] FDA and  has been authorized for detection and/or diagnosis of SARS-CoV-2 by  FDA under an Emergency Use Authorization (EUA). This EUA will remain  in effect (meaning this test can be used) for the duration of the  Covid-19 declaration under Section 564(b)(1) of the Act, 21  U.S.C. section 360bbb-3(b)(1), unless the authorization is  terminated or revoked. Performed at [facility], [place] [address] [place], [place] [address]    Glucose-Capillary 07/30/2019 155* 70 - 99 mg/dL Final   Glucose reference range applies only to samples taken after fasting for at least 8 hours.   Glucose-Capillary 07/30/2019 126* 70 - 99 mg/dL Final   Glucose reference range applies only to samples taken after fasting for at least 8  hours.  Admission on 07/13/2019, Discharged on 07/14/2019  Component Date Value Ref Range Status   SARS Coronavirus 2 by RT PCR 07/14/2019 NEGATIVE  NEGATIVE Final   Comment: (NOTE) SARS-CoV-2 target nucleic acids are NOT DETECTED. The SARS-CoV-2 RNA is generally detectable in upper respiratoy specimens during the acute phase of infection. The lowest concentration of SARS-CoV-2 viral copies this assay can detect is 131 copies/mL. A negative result does not preclude SARS-Cov-2 infection and should not be used as the sole basis for treatment or other patient management decisions. A negative result may occur with  improper  specimen collection/handling, submission of specimen other than nasopharyngeal swab, presence of viral mutation(s) within the areas targeted by this assay, and inadequate number of viral copies (<131 copies/mL). A negative result must be combined with clinical observations, patient history, and epidemiological information. The expected result is Negative. Fact Sheet for Patients:  PinkCheek.be Fact Sheet for Healthcare Providers:  GravelBags.it This test is not yet ap                          proved or cleared by the [place] FDA and  has been authorized for detection and/or diagnosis of SARS-CoV-2 by FDA under an Emergency Use Authorization (EUA). This EUA will remain  in effect (meaning this test can be used) for the duration of the COVID-19 declaration under Section 564(b)(1) of the Act, 21 U.S.C. section 360bbb-3(b)(1), unless the authorization is terminated or revoked sooner.    Influenza A by PCR 07/14/2019 NEGATIVE  NEGATIVE Final   Influenza B by PCR 07/14/2019 NEGATIVE  NEGATIVE Final   Comment: (NOTE) The Xpert Xpress SARS-CoV-2/FLU/RSV assay is intended as an aid in  the diagnosis of influenza from Nasopharyngeal swab specimens and  should not be used as a sole basis for treatment. Nasal washings and  aspirates are unacceptable for Xpert Xpress SARS-CoV-2/FLU/RSV  testing. Fact Sheet for Patients: PinkCheek.be Fact Sheet for Healthcare Providers: GravelBags[employer]it This test is not yet approved or cleared by the [place] FDA and  has been authorized for detection and/or diagnosis of SARS-CoV-2 by  FDA under an Emergency Use Authorization (EUA). This EUA will remain  in effect (meaning this test can be used) for the duration of the  Covid-19 declaration under Section 564(b)(1) of the Act, 21  U.S.C. section 360bbb-3(b)(1), unless the authorization is   terminated or revoked. Performed at [facility], [place] [address], [place], [place] [address]    Sodium 07/14/2019 142  135 - 145 mmol/L Final   Potassium 07/14/2019 3.7  3.5 - 5.1 mmol/L Final   Chloride 07/14/2019 108  98 - 111 mmol/L Final   CO2 07/14/2019 27  22 - 32 mmol/L Final   Glucose, Bld 07/14/2019 102* 70 - 99 mg/dL Final   Glucose reference range applies only to samples taken after fasting for at least 8 hours.   BUN 07/14/2019 12  6 - 20 mg/dL Final   Creatinine, Ser 07/14/2019 0.74  0.44 - 1.00 mg/dL Final   Calcium 07/14/2019 9.1  8.9 - 10.3 mg/dL Final   Total Protein 07/14/2019 6.7  6.5 - 8.1 g/dL Final   Albumin 07/14/2019 3.7  3.5 - 5.0 g/dL Final   AST 07/14/2019 20  15 - 41 U/L Final   ALT 07/14/2019 19  0 - 44 U/L Final   Alkaline Phosphatase 07/14/2019 33* 38 - 126 U/L Final   Total Bilirubin 07/14/2019 0.7  0.3 -  1.2 mg/dL Final   GFR calc non Af Amer 07/14/2019 >60  >60 mL/min Final   GFR calc Af Amer 07/14/2019 >60  >60 mL/min Final   Anion gap 07/14/2019 7  5 - 15 Final   Performed at [facility], [place] [address] [place], [place] [address]   Magnesium 07/14/2019 2.1  1.7 - 2.4 mg/dL Final   Performed at [place] [address] [place], [place] [address]   Hgb A1c MFr Bld 07/14/2019 5.9* 4.8 - 5.6 % Final   Comment: (NOTE) Pre diabetes:          5.7%-6.4% Diabetes:              >6.4% Glycemic control for   <7.0% adults with diabetes    Mean Plasma Glucose 07/14/2019 122.63  mg/dL Final   Performed at [facility], [place] [address], [place], [place] [address]   Alcohol, Ethyl (B) 07/14/2019 <10  <10 mg/dL Final   Comment: (NOTE) Lowest detectable limit for serum alcohol is 10 mg/dL. For medical purposes only. Performed at [facility], [place] [address], [place], [place] [address]    Cholesterol 07/14/2019 141  0 - 200 mg/dL Final    Triglycerides 07/14/2019 76  <150 mg/dL Final   HDL 07/14/2019 49  >40 mg/dL Final   Total CHOL/HDL Ratio 07/14/2019 2.9  RATIO Final   VLDL 07/14/2019 15  0 - 40 mg/dL Final   LDL Cholesterol 07/14/2019 77  0 - 99 mg/dL Final   Comment:        Total Cholesterol/HDL:CHD Risk Coronary Heart Disease Risk Table                     Men   Women  1/2 Average Risk   3.4   3.3  Average Risk       5.0   4.4  2 X Average Risk   9.6   7.1  3 X Average Risk  23.4   11.0        Use the calculated Patient Ratio above and the CHD Risk Table to determine the patient's CHD Risk.        ATP III CLASSIFICATION (LDL):  <100     mg/dL   Optimal  100-129  mg/dL   Near or Above                    Optimal  130-159  mg/dL   Borderline  160-189  mg/dL   High  >190     mg/dL   Very High Performed at [place] [address], [place], [place] [address]    Total Protein 07/14/2019 6.6  6.5 - 8.1 g/dL Final   Albumin 07/14/2019 3.8  3.5 - 5.0 g/dL Final   AST 07/14/2019 19  15 - 41 U/L Final   ALT 07/14/2019 18  0 - 44 U/L Final   Alkaline Phosphatase 07/14/2019 32* 38 - 126 U/L Final   Total Bilirubin 07/14/2019 0.6  0.3 - 1.2 mg/dL Final   Bilirubin, Direct 07/14/2019 0.1  0.0 - 0.2 mg/dL Final   Indirect Bilirubin 07/14/2019 0.5  0.3 - 0.9 mg/dL Final   Performed at [place] [address] [place], [place] [address]   TSH 07/14/2019 0.959  0.350 - 4.500 uIU/mL Final   Comment: Performed by a 3rd Generation assay with a functional sensitivity of <=0.01 uIU/mL. Performed at [facility]  [place] [address], [place], [place] [address]    Glucose-Capillary 07/14/2019 128* 70 - 99 mg/dL Final   Glucose reference range applies only to samples taken after fasting for at least 8 hours.  Admission on 06/29/2019, Discharged on 06/29/2019  Component Date Value Ref Range Status   WBC 06/29/2019 11.6* 4.0 - 10.5 K/uL Final   RBC  06/29/2019 4.78  3.87 - 5.11 MIL/uL Final   Hemoglobin 06/29/2019 12.2  12.0 - 15.0 g/dL Final   HCT 06/29/2019 39.9  36 - 46 % Final   MCV 06/29/2019 83.5  80.0 - 100.0 fL Final   MCH 06/29/2019 25.5* 26.0 - 34.0 pg Final   MCHC 06/29/2019 30.6  30.0 - 36.0 g/dL Final   RDW 06/29/2019 14.7  11.5 - 15.5 % Final   Platelets 06/29/2019 244  150 - 400 K/uL Final   nRBC 06/29/2019 0.0  0.0 - 0.2 % Final   Performed at [facility], [place] [address] [place], [place] [address]   Sodium 06/29/2019 143  135 - 145 mmol/L Final   Potassium 06/29/2019 3.7  3.5 - 5.1 mmol/L Final   Chloride 06/29/2019 104  98 - 111 mmol/L Final   CO2 06/29/2019 28  22 - 32 mmol/L Final   Glucose, Bld 06/29/2019 111* 70 - 99 mg/dL Final   Glucose reference range applies only to samples taken after fasting for at least 8 hours.   BUN 06/29/2019 7  6 - 20 mg/dL Final   Creatinine, Ser 06/29/2019 0.87  0.44 - 1.00 mg/dL Final   Calcium 06/29/2019 9.2  8.9 - 10.3 mg/dL Final   Total Protein 06/29/2019 7.2  6.5 - 8.1 g/dL Final   Albumin 06/29/2019 4.0  3.5 - 5.0 g/dL Final   AST 06/29/2019 24  15 - 41 U/L Final   ALT 06/29/2019 21  0 - 44 U/L Final   Alkaline Phosphatase 06/29/2019 44  38 - 126 U/L Final   Total Bilirubin 06/29/2019 0.5  0.3 - 1.2 mg/dL Final   GFR calc non Af Amer 06/29/2019 >60  >60 mL/min Final   GFR calc Af Amer 06/29/2019 >60  >60 mL/min Final   Anion gap 06/29/2019 11  5 - 15 Final   Performed at [facility] [address] [place], [place] [address]   I-stat hCG, quantitative 06/29/2019 <5.0  <5 mIU/mL Final   Comment 3 06/29/2019          Final   Comment:   GEST. AGE      CONC.  (mIU/mL)   <=1 WEEK        5 - 50     2 WEEKS       50 - 500     3 WEEKS       100 - 10,000     4 WEEKS     1,000 - 30,000        FEMALE AND NON-PREGNANT FEMALE:     LESS THAN 5 mIU/mL   Admission on 06/23/2019, Discharged on 06/23/2019  Component Date Value Ref Range  Status   SARS Coronavirus 2 by RT PCR 06/23/2019 NEGATIVE  NEGATIVE Final   Comment: (NOTE) SARS-CoV-2 target nucleic acids are NOT DETECTED. The SARS-CoV-2 RNA is generally detectable in upper respiratoy specimens during the acute phase of infection. The lowest concentration of SARS-CoV-2 viral copies this assay can detect is 131 copies/mL. A negative result does not preclude SARS-Cov-2 infection and should not  be used as the sole basis for treatment or other patient management decisions. A negative result may occur with  improper specimen collection/handling, submission of specimen other than nasopharyngeal swab, presence of viral mutation(s) within the areas targeted by this assay, and inadequate number of viral copies (<131 copies/mL). A negative result must be combined with clinical observations, patient history, and epidemiological information. The expected result is Negative. Fact Sheet for Patients:  PinkCheek.be Fact Sheet for Healthcare Providers:  [employer]it This test is not yet ap                          proved or cleared by the [place] FDA and  has been authorized for detection and/or diagnosis of SARS-CoV-2 by FDA under an Emergency Use Authorization (EUA). This EUA will remain  in effect (meaning this test can be used) for the duration of the COVID-19 declaration under Section 564(b)(1) of the Act, 21 U.S.C. section 360bbb-3(b)(1), unless the authorization is terminated or revoked sooner.    Influenza A by PCR 06/23/2019 NEGATIVE  NEGATIVE Final   Influenza B by PCR 06/23/2019 NEGATIVE  NEGATIVE Final   Comment: (NOTE) The Xpert Xpress SARS-CoV-2/FLU/RSV assay is intended as an aid in  the diagnosis of influenza from Nasopharyngeal swab specimens and  should not be used as a sole basis for treatment. Nasal washings and  aspirates are unacceptable for Xpert Xpress SARS-CoV-2/FLU/RSV  testing. Fact  Sheet for Patients: PinkCheek.be Fact Sheet for Healthcare Providers: [employer]vel[employer]it This test is not yet approved or cleared by the [place] FDA and  has been authorized for detection and/or diagnosis of SARS-CoV-2 by  FDA under an Emergency Use Authorization (EUA). This EUA will remain  in effect (meaning this test can be used) for the duration of the  Covid-19 declaration under Section 564(b)(1) of the Act, 21  U.S.C. section 360bbb-3(b)(1), unless the authorization is  terminated or revoked. Performed at [facility], [place] [address] [place], [place] [address]    Sodium 06/23/2019 140  135 - 145 mmol/L Final   Potassium 06/23/2019 3.9  3.5 - 5.1 mmol/L Final   Chloride 06/23/2019 106  98 - 111 mmol/L Final   CO2 06/23/2019 27  22 - 32 mmol/L Final   Glucose, Bld 06/23/2019 118* 70 - 99 mg/dL Final   Glucose reference range applies only to samples taken after fasting for at least 8 hours.   BUN 06/23/2019 15  6 - 20 mg/dL Final   Creatinine, Ser 06/23/2019 0.79  0.44 - 1.00 mg/dL Final   Calcium 06/23/2019 9.0  8.9 - 10.3 mg/dL Final   Total Protein 06/23/2019 7.3  6.5 - 8.1 g/dL Final   Albumin 06/23/2019 4.0  3.5 - 5.0 g/dL Final   AST 06/23/2019 21  15 - 41 U/L Final   ALT 06/23/2019 21  0 - 44 U/L Final   Alkaline Phosphatase 06/23/2019 38  38 - 126 U/L Final   Total Bilirubin 06/23/2019 0.6  0.3 - 1.2 mg/dL Final   GFR calc non Af Amer 06/23/2019 >60  >60 mL/min Final   GFR calc Af Amer 06/23/2019 >60  >60 mL/min Final   Anion gap 06/23/2019 7  5 - 15 Final   Performed at [facility], [place] [address], [place], [place] [address]   Hgb A1c MFr Bld 06/23/2019 5.2  4.8 - 5.6 % Final   Comment: (NOTE) Pre diabetes:  5.7%-6.4% Diabetes:              >6.4% Glycemic control for   <7.0% adults with diabetes    Mean Plasma Glucose 06/23/2019 102.54  mg/dL  Final   Performed at [facility], [place] [address] [place], [place] [address]   Magnesium 06/23/2019 2.1  1.7 - 2.4 mg/dL Final   Performed at [place] [address], [place], [place] [address]   Alcohol, Ethyl (B) 06/23/2019 <10  <10 mg/dL Final   Comment: (NOTE) Lowest detectable limit for serum alcohol is 10 mg/dL. For medical purposes only. Performed at [facility], [place] [address], [place], [place] [address]    Cholesterol 06/23/2019 152  0 - 200 mg/dL Final   Triglycerides 06/23/2019 120  <150 mg/dL Final   HDL 06/23/2019 43  >40 mg/dL Final   Total CHOL/HDL Ratio 06/23/2019 3.5  RATIO Final   VLDL 06/23/2019 24  0 - 40 mg/dL Final   LDL Cholesterol 06/23/2019 85  0 - 99 mg/dL Final   Comment:        Total Cholesterol/HDL:CHD Risk Coronary Heart Disease Risk Table                     Men   Women  1/2 Average Risk   3.4   3.3  Average Risk       5.0   4.4  2 X Average Risk   9.6   7.1  3 X Average Risk  23.4   11.0        Use the calculated Patient Ratio above and the CHD Risk Table to determine the patient's CHD Risk.        ATP III CLASSIFICATION (LDL):  <100     mg/dL   Optimal  100-129  mg/dL   Near or Above                    Optimal  130-159  mg/dL   Borderline  160-189  mg/dL   High  >190     mg/dL   Very High Performed at [facility] [address], [place], [name] [address]    Total Protein 06/23/2019 7.2  6.5 - 8.1 g/dL Final   Albumin 06/23/2019 3.9  3.5 - 5.0 g/dL Final   AST 06/23/2019 21  15 - 41 U/L Final   ALT 06/23/2019 19  0 - 44 U/L Final   Alkaline Phosphatase 06/23/2019 37* 38 - 126 U/L Final   Total Bilirubin 06/23/2019 0.5  0.3 - 1.2 mg/dL Final   Bilirubin, Direct 06/23/2019 0.1  0.0 - 0.2 mg/dL Final   Indirect Bilirubin 06/23/2019 0.4  0.3 - 0.9 mg/dL Final   Performed at [facility], [place] [address] [place], [place] [address]    TSH 06/23/2019 1.032  0.350 - 4.500 uIU/mL Final   Comment: Performed by a 3rd Generation assay with a functional sensitivity of <=0.01 uIU/mL. Performed at [facility], [place] [address], [place], [place] [address]   Admission on 06/21/2019, Discharged on 06/21/2019  Component Date Value Ref Range Status   Glucose, UA 06/21/2019 NEGATIVE  NEGATIVE mg/dL Final   Bilirubin Urine 06/21/2019 NEGATIVE  NEGATIVE Final   Ketones, ur 06/21/2019 TRACE* NEGATIVE mg/dL Final   Specific Gravity, Urine 06/21/2019 >=1.030  1.005 - 1.030 Final   Hgb urine dipstick 06/21/2019 NEGATIVE  NEGATIVE Final   pH 06/21/2019 6.0  5.0 - 8.0 Final   Protein, ur 06/21/2019 NEGATIVE  NEGATIVE mg/dL Final   Urobilinogen, UA 06/21/2019 0.2  0.0 - 1.0 mg/dL Final   Nitrite 06/21/2019 NEGATIVE  NEGATIVE Final   Leukocytes,Ua 06/21/2019 NEGATIVE  NEGATIVE Final   Biochemical Testing Only. Please order routine urinalysis from main lab if confirmatory testing is needed.   Preg Test, Ur 06/21/2019 NEGATIVE  NEGATIVE Final   Comment:        THE SENSITIVITY OF THIS METHODOLOGY IS >24 mIU/mL    Preg Test, Ur 06/21/2019 NEGATIVE  NEGATIVE Final   Comment:        THE SENSITIVITY OF THIS METHODOLOGY IS >24 mIU/mL    Bacterial vaginitis 06/21/2019 Negative for Bacterial Vaginitis Microorganisms   Final   Normal Reference Range - Negative   Candida vaginitis 06/21/2019 Negative for Candida species   Final   Normal Reference Range - Negative   Trichomonas 06/21/2019 Negative   Final   Normal Reference Range - Negative   Chlamydia 06/21/2019 Negative   Final   Normal Reference Range - Negative   Neisseria Gonorrhea 06/21/2019 Negative   Final   Normal Reference Range - Negative  There may be more visits with results that are not included.    Allergies: Wellbutrin [bupropion], Omnipaque [iohexol], Penicillins, Cogentin [benztropine], Depakote er [divalproex sodium er], Divalproex sodium,  and Iohexol  PTA Medications: (Not in a hospital admission)   Medical Decision Making  Admit for overnight observation safety and stabilization    Recommendations  Based on my evaluation the patient does not appear to have an emergency medical condition.  Cabrini Ruggieri, NP 10/10/19  7:07 PM

## 2019-10-10 NOTE — ED Notes (Signed)
Patient waiting on transportation. Transport have been call.

## 2019-10-10 NOTE — BH Assessment (Signed)
[name] Assessment Progress Note  Per Hampton Abbot, MD, this pt does not require psychiatric hospitalization at this time.  Pt is to be transferred to [facility] to their crisis unit for overnight observation.  At 13:05 I called the facility and spoke to [facility].  She reports that nurse-to-nurse report does not need to be called, and that no consents need to be signed.  At 13:42 this Probation officer called the [facility], pt's outpatient provider, to notify them; I spoke to [place].  Pt's nurse has been notified.  [name], [place] Triage Specialist [phone]

## 2019-10-10 NOTE — ED Notes (Signed)
Patient denies pain and is resting comfortably.  

## 2019-10-11 LAB — GLUCOSE, CAPILLARY
Glucose-Capillary: 112 mg/dL — ABNORMAL HIGH (ref 70–99)
Glucose-Capillary: 122 mg/dL — ABNORMAL HIGH (ref 70–99)

## 2019-10-11 NOTE — ED Notes (Signed)
Pt sleeping at present, no distress noted, calm & cooperative.  Monitoring for safety.
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
## 2019-10-11 NOTE — ED Notes (Signed)
Patient with nurse for glucose monitoring

## 2019-10-11 NOTE — Progress Notes (Signed)
[name] received her AVS, reviewed and her questions were answered. She received her personal belongings, got dressed and was escorted to the front door where a team member from [facility] was waiting for her.

## 2019-10-11 NOTE — ED Notes (Signed)
Patient is resting comfortably. 

## 2019-10-11 NOTE — Progress Notes (Signed)
[name] remains anxious related to catching the bus home. She was reassured several times. She eventually laid down to rest.

## 2019-10-11 NOTE — ED Notes (Signed)
Patient is dressing for discharge

## 2019-10-11 NOTE — Discharge Instructions (Addendum)
Follow up with [facility] Continue current home medications

## 2019-10-11 NOTE — ED Provider Notes (Signed)
FBC/OBS ASAP Discharge Summary  Date and Time: 10/11/2019 12:26 PM  Name: [name]  MRN:  [id number]   Discharge Diagnoses:  Final diagnoses:  Paranoid schizophrenia ([place])  Impulse control disorder  Learning disability  Generalized anxiety disorder  Cognitive deficits    Subjective: Patient is a 29 year old female who reports today that her biggest concern is her anxiety that she is continued to have in her current living situation.  She continues to state that she just wants to move out of the house because there was someone who died from a heart attack there and now she feels like they are in her house and following her around and that she can see either ghost in the house.  She states that the ghost helps the house and she just really wants to move and it causes her to have increased anxiety.  She denies having any suicidal or homicidal ideations and denies any hallucinations.  She states that she is still active with [facility] and that they have been assisting her with looking for housing but they have not found her anything yet.  She states that she would like them to be contacted to see if we can assist them to help her with a another house to live in.  Stay Summary: Patient remained in the observation unit overnight and was calm, cooperative, and pleasant.  Patient had not shown any aggressive or threatening behaviors.  Patient did not show any signs or symptoms of psychosis or responding to stimuli.  Patient had continued to deny any suicidal or homicidal ideations and denied any hallucinations.  Patient's ACT team was contacted and they agreed that the patient should follow-up with him as well as assist with no housing.  They stated that she is with the [employer] housing program and they are continuing to work with him to find her new place to live.  They have no concerns of the patient discharging home at this time  Total Time spent with patient: 30 minutes  Past Psychiatric History:  Patient has a history of bipolar disorder type I, most recently depressed severe with psychotic features.  Her last psychiatric hospitalization at our facility was on 09/12/2019 to 09/13/2019.  This was an observation overnight, and she was discharged the next day.  She has had multiple psychiatric hospitalizations in the past. Past Medical History:  Past Medical History:  Diagnosis Date   Anxiety    Bipolar 1 disorder ([place])    Cognitive deficits    Depression    Diabetes mellitus without complication ([place])    Hypertension    Mental disorder    Mental health disorder    Obesity     Past Surgical History:  Procedure Laterality Date   CESAREAN SECTION     CESAREAN SECTION N/A 04/25/2013   Procedure: REPEAT CESAREAN SECTION;  Surgeon: [name], MD;  Location: [facility];  Service: Obstetrics;  Laterality: N/A;   MASS EXCISION N/A 06/03/2012   Procedure: EXCISION MASS;  Surgeon: [name], MD;  Location: [facility];  Service: ENT;  Laterality: N/A;  Excision uvula mass   TONSILLECTOMY N/A 06/03/2012   Procedure: TONSILLECTOMY;  Surgeon: [name], MD;  Location: [facility];  Service: ENT;  Laterality: N/A;   TONSILLECTOMY     Family History:  Family History  Problem Relation Age of Onset   Hypertension Mother    Diabetes Father    Family Psychiatric History: None reported  Social History:  Social History   Substance and Sexual Activity  Alcohol Use Not Currently   Comment: occ: last intake 4 mts ago     Social History   Substance and Sexual Activity  Drug Use Not Currently   Types: "Crack" cocaine, Other-see comments   Comment: Patient reports hx of smoking Crack    Social History   Socioeconomic History   Marital status: Single    Spouse name: Not on file   Number of children: Not on file   Years of education: Not on file   Highest education level: Not on file  Occupational History   Not on file  Tobacco  Use   Smoking status: Current Every Day Smoker    Packs/day: 4.00    Years: 11.00    Pack years: 44.00    Types: Cigarettes   Smokeless tobacco: Never Used   Tobacco comment: Pt declined  Vaping Use   Vaping Use: Never used  Substance and Sexual Activity   Alcohol use: Not Currently    Comment: occ: last intake 4 mts ago   Drug use: Not Currently    Types: "Crack" cocaine, Other-see comments    Comment: Patient reports hx of smoking Crack   Sexual activity: Yes    Birth control/protection: Implant  Other Topics Concern   Not on file  Social History Narrative   ** Merged History Encounter **       Social Determinants of Health   Financial Resource Strain:    Difficulty of Paying Living Expenses:   Food Insecurity:    Worried About Charity fundraiser in the Last Year:    Arboriculturist in the Last Year:   Transportation Needs:    Film/video editor (Medical):    Lack of Transportation (Non-Medical):   Physical Activity:    Days of Exercise per Week:    Minutes of Exercise per Session:   Stress:    Feeling of Stress :   Social Connections:    Frequency of Communication with Friends and Family:    Frequency of Social Gatherings with Friends and Family:    Attends Religious Services:    Active Member of Clubs or Organizations:    Attends Music therapist:    Marital Status:    SDOH:  SDOH Screenings   Alcohol Screen: Low Risk    Last Alcohol Screening Score (AUDIT): 0  Depression (PHQ2-9): Low Risk    PHQ-2 Score: 0  Financial Resource Strain:    Difficulty of Paying Living Expenses:   Food Insecurity:    Worried About Charity fundraiser in the Last Year:    Arboriculturist in the Last Year:   Housing:    Last Housing Risk Score:   Physical Activity:    Days of Exercise per Week:    Minutes of Exercise per Session:   Social Connections:    Frequency of Communication with Friends and Family:    Frequency  of Social Gatherings with Friends and Family:    Attends Religious Services:    Active Member of Clubs or Organizations:    Attends Music therapist:    Marital Status:   Stress:    Feeling of Stress :   Tobacco Use: High Risk   Smoking Tobacco Use: Current Every Day Smoker   Smokeless Tobacco Use: Never Used  Transportation Needs:    Film/video editor (Medical):    Lack of Transportation (Non-Medical):  Has this patient used any form of tobacco in the last 30 days? (Cigarettes, Smokeless Tobacco, Cigars, and/or Pipes) Prescription not provided because: Refused  Current Medications:  Current Facility-Administered Medications  Medication Dose Route Frequency Provider Last Rate Last Admin   acetaminophen (TYLENOL) tablet 650 mg  650 mg Oral Q6H PRN Rankin, Shuvon B, NP       cholecalciferol (VITAMIN D) tablet 1,000 Units  1,000 Units Oral Daily Rankin, Shuvon B, NP   1,000 Units at 10/11/19 0903   gabapentin (NEURONTIN) capsule 300 mg  300 mg Oral TID PRN Rankin, Shuvon B, NP   300 mg at 10/10/19 2116   glipiZIDE (GLUCOTROL XL) 24 hr tablet 10 mg  10 mg Oral BID AC Rankin, Shuvon B, NP       hydrOXYzine (ATARAX/VISTARIL) tablet 25 mg  25 mg Oral TID PRN Rankin, Shuvon B, NP   25 mg at 10/11/19 0903   insulin aspart (novoLOG) injection 0-15 Units  0-15 Units Subcutaneous TID WC Rankin, Shuvon B, NP   2 Units at 10/11/19 1219   lamoTRIgine (LAMICTAL) tablet 100 mg  100 mg Oral Daily Rankin, Shuvon B, NP   100 mg at 10/11/19 0904   lithium carbonate (ESKALITH) CR tablet 900 mg  900 mg Oral QHS Rankin, Shuvon B, NP   900 mg at 10/10/19 2113   propranolol (INDERAL) tablet 10 mg  10 mg Oral BID Rankin, Shuvon B, NP   10 mg at 10/11/19 0904   simvastatin (ZOCOR) tablet 20 mg  20 mg Oral QHS Rankin, Shuvon B, NP   20 mg at 10/10/19 2115   Current Outpatient Medications  Medication Sig Dispense Refill   cholecalciferol (VITAMIN D) 25 MCG (1000 UNIT)  tablet Take 1,000 Units by mouth daily.     gabapentin (NEURONTIN) 300 MG capsule Take 300 mg by mouth 3 (three) times daily as needed.      glipiZIDE (GLUCOTROL XL) 10 MG 24 hr tablet Take 10 mg by mouth 2 (two) times daily before lunch and supper.     hydrochlorothiazide (HYDRODIURIL) 25 MG tablet Take 25 mg by mouth daily.     lamoTRIgine (LAMICTAL) 100 MG tablet Take 100 mg by mouth daily.     LEVEMIR FLEXTOUCH 100 UNIT/ML FlexPen Inject 40 Units into the skin 2 (two) times daily.      lithium carbonate (ESKALITH) 450 MG CR tablet Take 2 tablets (900 mg total) by mouth at bedtime. 60 tablet 2   perphenazine (TRILAFON) 2 MG tablet Take 6 mg by mouth 2 (two) times daily.     propranolol (INDERAL) 10 MG tablet Take 1 tablet (10 mg total) by mouth 2 (two) times daily. 60 tablet 2   simvastatin (ZOCOR) 20 MG tablet Take 20 mg by mouth at bedtime.     ARIPiprazole ER (ABILIFY MAINTENA) 400 MG SRER injection Inject 2 mLs (400 mg total) into the muscle every 28 (twenty-eight) days. 1 each 11    PTA Medications: (Not in a hospital admission)   Musculoskeletal  Strength & Muscle Tone: within normal limits Gait & Station: normal Patient leans: N/A  Psychiatric Specialty Exam  Presentation  General Appearance: No data recorded Eye Contact:Good  Speech:Normal Rate;Clear and Coherent  Speech Volume:Normal  Handedness:Right   Mood and Affect  Mood:Euthymic  Affect:Appropriate   Thought Process  Thought Processes:Coherent  Descriptions of Associations:Tangential  Orientation:Full (Time, Place and Person)  Thought Content:Paranoid Ideation  Hallucinations:Hallucinations: None  Ideas of Reference:Paranoia  Suicidal Thoughts:Suicidal Thoughts: No  Homicidal Thoughts:No data recorded  Sensorium  Memory:Immediate Good;Recent Good;Remote Fair  Judgment:Fair  Insight:Fair;Present   Executive Functions  Concentration:Fair  Attention  Span:Fair  [place]   Psychomotor Activity  Psychomotor Activity:Psychomotor Activity: Normal   Assets  Assets:Communication Skills;Desire for Improvement;Housing;Leisure Time   Sleep  Sleep:Sleep: Fair (reporting scared to sleep related to the man that died in boarding home and house being haunted)   Physical Exam  Physical Exam Vitals and nursing note reviewed.  Constitutional:      Appearance: She is well-developed.  Cardiovascular:     Rate and Rhythm: Normal rate.  Pulmonary:     Effort: Pulmonary effort is normal.  Musculoskeletal:        General: Normal range of motion.  Skin:    General: Skin is warm.  Neurological:     Mental Status: She is alert and oriented to person, place, and time.    Review of Systems  Constitutional: Negative.   HENT: Negative.   Eyes: Negative.   Respiratory: Negative.   Cardiovascular: Negative.   Gastrointestinal: Negative.   Genitourinary: Negative.   Musculoskeletal: Negative.   Skin: Negative.   Neurological: Negative.   Endo/Heme/Allergies: Negative.    Blood pressure 125/84, pulse 80, temperature 98.6 F (37 C), temperature source Oral, resp. rate 20, height 5\' 7"  (1.702 m), weight 117.9 kg, SpO2 100 %. Body mass index is 40.72 kg/m.  Demographic Factors:  Low socioeconomic status  Loss Factors: Decrease in vocational status  Historical Factors: NA  Risk Reduction Factors:   Religious beliefs about death, Living with another person, especially a relative, Positive social support and Positive therapeutic relationship  Continued Clinical Symptoms:  Bipolar Disorder:   Depressive phase  Cognitive Features That Contribute To Risk:  None    Suicide Risk:  Minimal: No identifiable suicidal ideation.  Patients presenting with no risk factors but with morbid ruminations; may be classified as minimal risk based on the severity of the depressive symptoms  Plan Of  Care/Follow-up recommendations:  Continue activity as tolerated. Continue diet as recommended by your PCP. Ensure to keep all appointments with outpatient providers.  Disposition: Return to current living arrangements. Follow up with [facility]. Continue current home medications  Lewis Shock, FNP 10/11/2019, 12:26 PM

## 2019-10-13 LAB — I-STAT BETA HCG BLOOD, ED (MC, WL, AP ONLY): I-stat hCG, quantitative: <5 m[IU]/mL (ref <5)

## 2019-10-16 ENCOUNTER — Emergency Department (HOSPITAL_COMMUNITY)
Admission: EM | Admit: 2019-10-16 | Discharge: 2019-10-17 | Disposition: A | Discharge status: Other Acute Inpatient Hospital | Payer: Medicaid Other | Attending: Emergency Medicine | Admitting: Emergency Medicine

## 2019-10-16 ENCOUNTER — Encounter (HOSPITAL_COMMUNITY): Payer: Self-pay

## 2019-10-16 DIAGNOSIS — T1491XA Suicide attempt, initial encounter: Secondary | ICD-10-CM

## 2019-10-16 DIAGNOSIS — F250 Schizoaffective disorder, bipolar type: Secondary | ICD-10-CM | POA: Insufficient documentation

## 2019-10-16 DIAGNOSIS — T426X2A Poisoning by other antiepileptic and sedative-hypnotic drugs, intentional self-harm, initial encounter: Secondary | ICD-10-CM | POA: Diagnosis present

## 2019-10-16 DIAGNOSIS — Z20822 Contact with and (suspected) exposure to covid-19: Secondary | ICD-10-CM | POA: Diagnosis not present

## 2019-10-16 DIAGNOSIS — T4272XA Poisoning by unspecified antiepileptic and sedative-hypnotic drugs, intentional self-harm, initial encounter: Secondary | ICD-10-CM | POA: Insufficient documentation

## 2019-10-16 DIAGNOSIS — F329 Major depressive disorder, single episode, unspecified: Secondary | ICD-10-CM | POA: Diagnosis not present

## 2019-10-16 DIAGNOSIS — I10 Essential (primary) hypertension: Secondary | ICD-10-CM | POA: Diagnosis not present

## 2019-10-16 DIAGNOSIS — F17210 Nicotine dependence, cigarettes, uncomplicated: Secondary | ICD-10-CM | POA: Insufficient documentation

## 2019-10-16 LAB — COMPREHENSIVE METABOLIC PANEL
ALT: 25 U/L (ref 0–44)
AST: 25 U/L (ref 15–41)
Albumin: 4 g/dL (ref 3.5–5.0)
Alkaline Phosphatase: 36 U/L — ABNORMAL LOW (ref 38–126)
Anion gap: 9 (ref 5–15)
BUN: 11 mg/dL (ref 6–20)
CO2: 27 mmol/L (ref 22–32)
Calcium: 9.5 mg/dL (ref 8.9–10.3)
Chloride: 105 mmol/L (ref 98–111)
Creatinine, Ser: 0.94 mg/dL (ref 0.44–1.00)
GFR calc Af Amer: >60 mL/min (ref >60)
GFR calc non Af Amer: >60 mL/min (ref >60)
Glucose, Bld: 163 mg/dL — ABNORMAL HIGH (ref 70–99)
Potassium: 3 mmol/L — ABNORMAL LOW (ref 3.5–5.1)
Sodium: 141 mmol/L (ref 135–145)
Total Bilirubin: 0.5 mg/dL (ref 0.3–1.2)
Total Protein: 7 g/dL (ref 6.5–8.1)

## 2019-10-16 LAB — RAPID URINE DRUG SCREEN, HOSP PERFORMED
Amphetamines: NOT DETECTED
Barbiturates: NOT DETECTED
Benzodiazepines: NOT DETECTED
Cocaine: NOT DETECTED
Opiates: NOT DETECTED
Tetrahydrocannabinol: NOT DETECTED

## 2019-10-16 LAB — CBC
HCT: 38.6 % (ref 36.0–46.0)
Hemoglobin: 12.1 g/dL (ref 12.0–15.0)
MCH: 26.6 pg (ref 26.0–34.0)
MCHC: 31.3 g/dL (ref 30.0–36.0)
MCV: 84.8 fL (ref 80.0–100.0)
Platelets: 259 10*3/uL (ref 150–400)
RBC: 4.55 MIL/uL (ref 3.87–5.11)
RDW: 14.3 % (ref 11.5–15.5)
WBC: 11.5 10*3/uL — ABNORMAL HIGH (ref 4.0–10.5)
nRBC: 0 % (ref 0.0–0.2)

## 2019-10-16 LAB — SALICYLATE LEVEL: Salicylate Lvl: <7 mg/dL — ABNORMAL LOW (ref 7.0–30.0)

## 2019-10-16 LAB — ETHANOL: Alcohol, Ethyl (B): <10 mg/dL (ref <10)

## 2019-10-16 LAB — I-STAT BETA HCG BLOOD, ED (MC, WL, AP ONLY): I-stat hCG, quantitative: <5 m[IU]/mL (ref <5)

## 2019-10-16 LAB — ACETAMINOPHEN LEVEL: Acetaminophen (Tylenol), Serum: <10 ug/mL — ABNORMAL LOW (ref 10–30)

## 2019-10-16 LAB — SARS CORONAVIRUS 2 BY RT PCR (HOSPITAL ORDER, PERFORMED IN ~~LOC~~ HOSPITAL LAB): SARS Coronavirus 2: NEGATIVE

## 2019-10-16 LAB — LITHIUM LEVEL: Lithium Lvl: 0.39 mmol/L — ABNORMAL LOW (ref 0.60–1.20)

## 2019-10-16 MED ORDER — POTASSIUM CHLORIDE CRYS ER 20 MEQ PO TBCR
40.0000 meq | EXTENDED_RELEASE_TABLET | Freq: Once | ORAL | Status: AC
  Administered 2019-10-16: 40 meq via ORAL
  Filled 2019-10-16: qty 2

## 2019-10-16 NOTE — ED Provider Notes (Signed)
[facility] EMERGENCY DEPARTMENT Provider Note   CSN: [id number] Arrival date & time: 10/16/19  1625    History Chief Complaint  Patient presents with  . Suicidal    [name] is a 29 y.o. female history of diabetes, bipolar, hypertension who presents for evaluation of suicide attempt.  Patient states she recently found out that her significant other is married.  States this made her "upset."  States she took #4 300 mg gabapentin and 225 mg hydroxyzine earlier today with attempt to end her life.  Patient states "I need more medication for my depression."  Had increased anxiety.  States she feels like she saw the devil earlier today.  States the devil did not tell her anything however "went that me."  She told her roommate that she had plans on harming herself.  States she has had prior suicide attempts in the past.  Tells me she is compliant with her home medications.  Denies fever, chills, nausea, vomiting, chest pain, shortness of breath abdominal pain, diarrhea, dysuria.  Denies additional aggravating or relieving factors.  History pain from patient and past medical records. No interpretor was used.  HPI     Past Medical History:  Diagnosis Date  . Anxiety   . Bipolar 1 disorder ([place])   . Cognitive deficits   . Depression   . Diabetes mellitus without complication ([place])   . Hypertension   . Mental disorder   . Mental health disorder   . Obesity     Patient Active Problem List   Diagnosis Date Noted  . Schizophrenia ([place]) 09/13/2019  . Bipolar 1 disorder ([place]) 06/23/2019  . MDD (major depressive disorder) 10/10/2018  . Schizoaffective disorder, bipolar type ([place]) 09/25/2018  . Bipolar I disorder ([place]) 06/13/2018  . HTN (hypertension) 05/03/2018  . Tobacco use disorder 05/03/2018  . Bipolar I disorder, most recent episode depressed, severe without psychotic features ([place]) 05/02/2018  . Adjustment disorder with emotional disturbance 01/02/2018  .  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 Moderate bipolar I disorder, most recent episode depressed ([place])   . Psychosis ([place])   . 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 OCD (obsessive compulsive disorder) 10/05/2016  . Major depressive disorder, recurrent episode, mild ([place]) 05/04/2016  . 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 Learning disability 07/18/2015  . 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 Diabetes mellitus ([place]) 07/18/2015  . 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 Hyperlipidemia 07/18/2015  . Severe episode of recurrent major depressive disorder, without psychotic features ([place])   . Suicidal ideation   . Drug overdose   . Cognitive deficits 10/12/2012  . Generalized anxiety disorder 06/28/2012    Past Surgical History:  Procedure Laterality Date  . CESAREAN SECTION    . CESAREAN SECTION N/A 04/25/2013   Procedure: REPEAT CESAREAN SECTION;  Surgeon: [name], MD;  Location: [facility];  Service: Obstetrics;  Laterality: N/A;  . MASS EXCISION N/A 06/03/2012   Procedure: EXCISION MASS;  Surgeon: [name], MD;  Location: [facility];  Service: ENT;  Laterality: N/A;  Excision uvula mass  . TONSILLECTOMY N/A 06/03/2012   Procedure: TONSILLECTOMY;  Surgeon: [name], MD;  Location: [facility];  Service: ENT;  Laterality: N/A;  . TONSILLECTOMY       OB History    Gravida  3   Para  3   Term  3  Preterm  0   AB  0   Living  3     SAB  0   TAB  0   Ectopic  0   Multiple      Live Births  3           Family History  Problem Relation Age of Onset  . Hypertension Mother   . Diabetes Father     Social History   Tobacco Use  . Smoking status: Current Every Day Smoker    Packs/day: 4.00    Years: 11.00    Pack years: 44.00    Types: Cigarettes  . Smokeless tobacco:  Never Used  . Tobacco comment: Pt declined  Vaping Use  . Vaping Use: Never used  Substance Use Topics  . Alcohol use: Not Currently    Comment: occ: last intake 4 mts ago  . Drug use: Not Currently    Types: "Crack" cocaine, Other-see comments    Comment: Patient reports hx of smoking Crack    Home Medications Prior to Admission medications   Medication Sig Start Date End Date Taking? Authorizing Provider  ARIPiprazole ER (ABILIFY MAINTENA) 400 MG SRER injection Inject 2 mLs (400 mg total) into the muscle every 28 (twenty-eight) days. 06/28/19   Johnn Hai, MD  cholecalciferol (VITAMIN D) 25 MCG (1000 UNIT) tablet Take 1,000 Units by mouth daily. 08/21/19   [provider]  gabapentin (NEURONTIN) 300 MG capsule Take 300 mg by mouth 3 (three) times daily as needed.  08/21/19   [provider]  glipiZIDE (GLUCOTROL XL) 10 MG 24 hr tablet Take 10 mg by mouth 2 (two) times daily before lunch and supper.    [provider]  hydrochlorothiazide (HYDRODIURIL) 25 MG tablet Take 25 mg by mouth daily. 08/21/19   [provider]  lamoTRIgine (LAMICTAL) 100 MG tablet Take 100 mg by mouth daily. 08/21/19   [provider]  LEVEMIR FLEXTOUCH 100 UNIT/ML FlexPen Inject 40 Units into the skin 2 (two) times daily.  08/09/19   [provider]  lithium carbonate (ESKALITH) 450 MG CR tablet Take 2 tablets (900 mg total) by mouth at bedtime. 06/02/19   Johnn Hai, MD  perphenazine (TRILAFON) 2 MG tablet Take 6 mg by mouth 2 (two) times daily.    [provider]  propranolol (INDERAL) 10 MG tablet Take 1 tablet (10 mg total) by mouth 2 (two) times daily. 06/02/19   Johnn Hai, MD  simvastatin (ZOCOR) 20 MG tablet Take 20 mg by mouth at bedtime. 08/21/19   [provider]    Allergies    Wellbutrin [bupropion], Omnipaque [iohexol], Penicillins, Cogentin [benztropine], Depakote er [divalproex sodium er], Divalproex sodium, and Iohexol  Review  of Systems   Review of Systems  Constitutional: Negative.   HENT: Negative.   Respiratory: Negative.   Cardiovascular: Negative.   Gastrointestinal: Negative.   Genitourinary: Negative.   Musculoskeletal: Negative.   Skin: Negative.   Psychiatric/Behavioral: Positive for self-injury, sleep disturbance and suicidal ideas. The patient is nervous/anxious and is hyperactive.   All other systems reviewed and are negative.   Physical Exam Updated Vital Signs BP (!) 127/93 (BP Location: Right Wrist)   Pulse 97   Temp 98.6 F (37 C) (Oral)   Resp 14   Ht 5\' 7"  (1.702 m)   Wt 118.4 kg   SpO2 99%   BMI 40.88 kg/m   Physical Exam Vitals and nursing note reviewed.  Constitutional:  General: She is not in acute distress.    Appearance: She is well-developed. She is not ill-appearing, toxic-appearing or diaphoretic.     Comments: Eating on exam.  HENT:     Head: Normocephalic and atraumatic.     Mouth/Throat:     Mouth: Mucous membranes are moist.  Eyes:     Pupils: Pupils are equal, round, and reactive to light.  Cardiovascular:     Rate and Rhythm: Normal rate.     Pulses: Normal pulses.     Heart sounds: Normal heart sounds.  Pulmonary:     Effort: Pulmonary effort is normal. No respiratory distress.     Breath sounds: Normal breath sounds.  Abdominal:     General: Bowel sounds are normal. There is no distension.  Musculoskeletal:        General: Normal range of motion.     Cervical back: Normal range of motion.  Skin:    General: Skin is warm and dry.  Neurological:     General: No focal deficit present.     Mental Status: She is alert.  Psychiatric:        Mood and Affect: Mood is elated.        Behavior: Behavior is hyperactive.        Thought Content: Thought content does not include homicidal ideation. Thought content does not include homicidal plan.     Comments: Patient hyperactive and speaking loudly.  Admits to seeing the devil.  Admits to recent suicide  attempt of overdosing on medication.  Admits to plan of suicide.  Denies homicidal ideations.     ED Results / Procedures / Treatments   Labs (all labs ordered are listed, but only abnormal results are displayed) Labs Reviewed  COMPREHENSIVE METABOLIC PANEL - Abnormal; Notable for the following components:      Result Value   Potassium 3.0 (*)    Glucose, Bld 163 (*)    Alkaline Phosphatase 36 (*)    All other components within normal limits  SALICYLATE LEVEL - Abnormal; Notable for the following components:   Salicylate Lvl <2.1 (*)    All other components within normal limits  ACETAMINOPHEN LEVEL - Abnormal; Notable for the following components:   Acetaminophen (Tylenol), Serum <10 (*)    All other components within normal limits  CBC - Abnormal; Notable for the following components:   WBC 11.5 (*)    All other components within normal limits  SARS CORONAVIRUS 2 BY RT PCR (HOSPITAL ORDER, [facility])  ETHANOL  RAPID URINE DRUG SCREEN, HOSP PERFORMED  LITHIUM LEVEL  I-STAT BETA HCG BLOOD, ED ([facility], [facility], [facility] ONLY)    EKG None  Radiology No results found.  Procedures Procedures (including critical care time)  Medications Ordered in ED Medications  potassium chloride SA (KLOR-CON) CR tablet 40 mEq (has no administration in time range)    ED Course  I have reviewed the triage vital signs and the nursing notes.  Pertinent labs & imaging results that were available during my care of the patient were reviewed by me and considered in my medical decision making (see chart for details).  Patient with known history of schizophrenia presents for evaluation after suicide attempt.  Took #4 300 mg gabapentin and number 225 mg hydroxyzine earlier today with attempts in her life.  The to increase anxiety and depression after finding out that her significant other is married.  Has been seen previously in the past for SI.  Admits to visual hallucinations of  recently seeing "the devil wink at me."  Labs obtained from triage.  Personally reviewed and interpreted.  Mild hypokalemia will replace orally.  Tolerating p.o. intake on exam  Patient medically cleared.  Disposition per TTS.  Psych hold orders placed  Patient here voluntarily    MDM Rules/Calculators/A&P                           Final Clinical Impression(s) / ED Diagnoses Final diagnoses:  Suicide attempt [facility])    Rx / DC Orders ED Discharge Orders    None       Jerime Arif A, PA-C 10/16/19 Jonelle Sports, MD 10/16/19 2311

## 2019-10-16 NOTE — ED Triage Notes (Signed)
Pt bib gcems and gpd voluntarily w/ c/o SI. Pt reports she took 5 300 mg gabapentin and 2 hydroxyzine in an effort to commit suicide. EMS VSS

## 2019-10-17 ENCOUNTER — Ambulatory Visit (HOSPITAL_COMMUNITY)
Admission: EM | Admit: 2019-10-17 | Discharge: 2019-10-17 | Disposition: A | Discharge status: Home / Self Care | Payer: Medicaid Other | Attending: Psychiatry | Admitting: Psychiatry

## 2019-10-17 ENCOUNTER — Ambulatory Visit (HOSPITAL_COMMUNITY): Payer: Self-pay | Admitting: Respite Care

## 2019-10-17 ENCOUNTER — Ambulatory Visit (HOSPITAL_COMMUNITY): Admission: AD | Admit: 2019-10-17 | Payer: Medicaid Other | Source: Intra-hospital | Admitting: Psychiatry

## 2019-10-17 ENCOUNTER — Ambulatory Visit (HOSPITAL_COMMUNITY)
Admission: EM | Admit: 2019-10-17 | Discharge: 2019-10-18 | Disposition: A | Discharge status: Home / Self Care | Payer: Medicaid Other | Source: Home / Self Care | Attending: Psychiatry | Admitting: Psychiatry

## 2019-10-17 ENCOUNTER — Encounter (HOSPITAL_COMMUNITY): Payer: Self-pay

## 2019-10-17 ENCOUNTER — Other Ambulatory Visit: Payer: Self-pay

## 2019-10-17 DIAGNOSIS — I10 Essential (primary) hypertension: Secondary | ICD-10-CM | POA: Insufficient documentation

## 2019-10-17 DIAGNOSIS — Z79899 Other long term (current) drug therapy: Secondary | ICD-10-CM | POA: Insufficient documentation

## 2019-10-17 DIAGNOSIS — Z794 Long term (current) use of insulin: Secondary | ICD-10-CM | POA: Insufficient documentation

## 2019-10-17 DIAGNOSIS — F639 Impulse disorder, unspecified: Secondary | ICD-10-CM

## 2019-10-17 DIAGNOSIS — F17210 Nicotine dependence, cigarettes, uncomplicated: Secondary | ICD-10-CM | POA: Insufficient documentation

## 2019-10-17 DIAGNOSIS — F4329 Adjustment disorder with other symptoms: Secondary | ICD-10-CM | POA: Insufficient documentation

## 2019-10-17 DIAGNOSIS — E119 Type 2 diabetes mellitus without complications: Secondary | ICD-10-CM | POA: Insufficient documentation

## 2019-10-17 DIAGNOSIS — F250 Schizoaffective disorder, bipolar type: Secondary | ICD-10-CM

## 2019-10-17 LAB — CBG MONITORING, ED: Glucose-Capillary: 141 mg/dL — ABNORMAL HIGH (ref 70–99)

## 2019-10-17 MED ORDER — ACETAMINOPHEN 325 MG PO TABS: 650.0000 mg | ORAL_TABLET | Freq: Four times a day (QID) | ORAL | Status: DC | PRN

## 2019-10-17 NOTE — ED Notes (Addendum)
Attempted TTS, machine not working

## 2019-10-17 NOTE — BH Assessment (Signed)
Per [name], NP continuous assessment at [facility] is recommended.  Patient to be transferred via safe transport.  RN, Judson Roch informed of disposition plan.

## 2019-10-17 NOTE — ED Notes (Signed)
Called [facility]  

## 2019-10-17 NOTE — BHH Counselor (Signed)
Per Nurse Marye Round, pt needs to be placed in room to complete TTS assessment, all rooms at hospital are full at moment, will call TTS when pt is able to be seen and placed in a room.

## 2019-10-17 NOTE — ED Notes (Signed)
Lunch tray not delivered. Called service response, who reports no tray was ordered. Tray to be delivered.

## 2019-10-17 NOTE — BH Assessment (Incomplete)
Comprehensive Clinical Assessment (CCA) Note  10/17/2019 [name] [id number] Pt was seen earlier via teleassessment by TTS.  Pt had yesterday (06/20) taken an overdose of 5 gabapentin and 2 hydroxizine.  She called EMS who brought her to [facility].  She said she took the pills because of her relationship with her boyfriend.  Pt was medically cleared and TTS teleassessed her today.  She   Visit Diagnosis:   No diagnosis found.    CCA Screening, Triage and Referral (STR)  Patient Reported Information How did you hear about [place]? Hospital Discharge  Referral name: Came from the [facility]  Referral phone number: No data recorded  Whom do you see for routine medical problems? Other (Comment) ([facility] team)  Practice/Facility Name: No data recorded Practice/Facility Phone Number: No data recorded Name of Contact: No data recorded Contact Number: No data recorded Contact Fax Number: No data recorded Prescriber Name: No data recorded Prescriber Address (if known): No data recorded  What Is the Reason for Your Visit/Call Today? Overdose attempt after learning boyfriend of 4 months is married.  How Long Has This Been Causing You Problems? <Week  What Do You Feel Would Help You the Most Today? Medication   Have You Recently Been in Any Inpatient Treatment (Hospital/Detox/Crisis Center/28-Day Program)? Yes (Pt was at [facility] on 06/14?)  Name/Location of Program/Hospital:[place] today  How Long Were You There? Was in [facility] for one day  When Were You Discharged? 10/11/19 (From [facility])   Have You Ever Received Services From [facility] Before? Yes  Who Do You See at [facility]? [place] [facility], Inpatient Treatment   Have You Recently Had Any Thoughts About Hurting Yourself? Yes  Are You Planning to Commit Suicide/Harm Yourself At This time? Yes ("Overdose again"  Had taken 5 gabapentin and 2 hydroxizine today.  Went to [facility] for clearance.)   Have you Recently Had Thoughts About [place]? No  Explanation: No data recorded  Have You Used Any Alcohol or Drugs in the Past 24 Hours? No  How Long Ago Did You Use Drugs or Alcohol? No data recorded What Did You Use and How Much? No data recorded  Do You Currently Have a Therapist/Psychiatrist? Yes  Name of Therapist/Psychiatrist: [facility] team   Have You Been Recently Discharged From Any Office Practice or Programs? No  Explanation of Discharge From Practice/Program: No data recorded    CCA Screening Triage Referral Assessment Type of Contact: Face-to-Face (Pt left [facility] and was [facility] to [facility])  Is this Initial or Reassessment? Initial Assessment (Pt was assessed earlier by TTS today.)  Date Telepsych consult ordered in [facility]:  10/17/19  Time Telepsych consult ordered in [facility]:  [place]   Patient Reported Information Reviewed? Yes  Patient Left Without Being Seen? No data recorded Reason for Not Completing Assessment: pt unable to be assessed currently in hallway   Collateral Involvement: None   Does Patient Have a [place]? No data recorded Name and Contact of Legal Guardian: Self.   If Minor and Not Living with Parent(s), Who has Custody? Self  Is CPS involved or ever been involved? Never  Is APS involved or ever been involved? Never   Patient Determined To Be At Risk for Harm To Self or Others Based on Review of Patient Reported Information or Presenting Complaint? Yes, for Self-Harm  Method: No data recorded Availability of Means: No data recorded Intent: No data recorded Notification Required: No data recorded Additional Information for  Danger to Others Potential: No data recorded Additional Comments for Danger to Others Potential: No data recorded Are There Guns or Other Weapons in [place]? No data recorded Types of Guns/Weapons: No data recorded Are These Weapons Safely Secured?                            No data recorded Who Could Verify You Are Able To Have  These Secured: No data recorded Do You Have any Outstanding Charges, Pending Court Dates, Parole/Probation? No data recorded Contacted To Inform of Risk of Harm To Self or Others: Unable to Contact:   Location of Assessment: [facility] Assessment Services (Pt at [facility] as a walk in)   Does Patient Present under Involuntary Commitment? No  IVC Papers Initial File Date: No data recorded  [place] of Residence: [place]   Patient Currently Receiving the Following Services: ACTT Designer, fashion/clothing Treatment) (Pt reports [facility])   Determination of Need: Emergent (2 hours)   Options For Referral: [facility]     CCA Biopsychosocial  Intake/Chief Complaint:  CCA Intake With Chief Complaint CCA Part Two Date: 10/17/19 CCA Part Two Time: 2006 Chief Complaint/Presenting Problem: Paranoia Patient's Currently Reported Symptoms/Problems: Thinks her boyfriend is cheating on her. Individual's Strengths: Able to communicate needs. Individual's Preferences: Wants to get into a new residence. Type of Services Patient Feels Are Needed: Housing Initial Clinical Notes/Concerns: patient with significant history in ED and [facility]; currently followed by ACTT and taking medications as prescribed.  Mental Health Symptoms Depression:  Depression: Difficulty Concentrating, Fatigue, Irritability, Tearfulness, Duration of symptoms greater than two weeks  Mania:  Mania: Racing thoughts  Anxiety:   Anxiety: Difficulty concentrating, Worrying  Psychosis:  Psychosis: Hallucinations (Saw a devil 2-3 nights ago.)  Trauma:  Trauma: None  Obsessions:     Compulsions:     Inattention:  Inattention: Does not follow instructions (not oppositional)  Hyperactivity/Impulsivity:  Hyperactivity/Impulsivity: Fidgets with hands/feet  Oppositional/Defiant Behaviors:  Oppositional/Defiant Behaviors: N/A  Emotional Irregularity:  Emotional Irregularity: Mood lability  Other Mood/Personality Symptoms:  Other  Mood/Personality Symptoms: NA   Mental Status Exam Appearance and self-care  Stature:  Stature: Average  Weight:  Weight: Overweight  Clothing:  Clothing: Casual (Wearing hospital scrubs.)  Grooming:  Grooming: Normal  Cosmetic use:  Cosmetic Use: None  Posture/gait:  Posture/Gait: Normal  Motor activity:  Motor Activity: Not Remarkable  Sensorium  Attention:  Attention: Normal  Concentration:  Concentration: Normal  Orientation:  Orientation: Person, Place, Situation, Time  Recall/memory:  Recall/Memory: Defective in Short-term  Affect and Mood  Affect:  Affect: Flat, Depressed  Mood:  Mood: Anxious  Relating  Eye contact:  Eye Contact: Normal  Facial expression:  Facial Expression: Responsive  Attitude toward examiner:  Attitude Toward Examiner: Cooperative  Thought and Language  Speech flow: Speech Flow: Clear and Coherent  Thought content:  Thought Content: Appropriate to Mood and Circumstances  Preoccupation:  Preoccupations: Somatic (Pins & needles)  Hallucinations:  Hallucinations: Visual (Saw the devil 2-3 days ago.)  Organization:     Transport planner of Knowledge:  Fund of Knowledge: Fair  Intelligence:  Intelligence: Average (Pt claims to have learning disabilities)  Abstraction:  Abstraction: Normal  Judgement:  Judgement: Impaired  Reality Testing:  Reality Testing: Realistic  Insight:  Insight: Fair  Decision Making:  Decision Making: Only simple  Social Functioning  Social Maturity:  Social Maturity: Self-centered  Social Judgement:  Social Judgement:  Normal  Stress  Stressors:  Stressors: Housing  Coping Ability:  Coping Ability: Research officer, political party Deficits:  Skill Deficits: Decision making  Supports:  Supports: Other (Comment), Family ([facility] and her mother)     Religion:    Leisure/Recreation:    Exercise/Diet:     CCA Employment/Education  Employment/Work Situation:    Education:     CCA Family/Childhood History  Family  and Relationship History:    Childhood History:     Child/Adolescent Assessment:     CCA Substance Use  Alcohol/Drug Use:                           ASAM's:  Six Dimensions of Multidimensional Assessment  Dimension 1:  Acute Intoxication and/or Withdrawal Potential:      Dimension 2:  Biomedical Conditions and Complications:      Dimension 3:  Emotional, Behavioral, or Cognitive Conditions and Complications:     Dimension 4:  Readiness to Change:     Dimension 5:  Relapse, Continued use, or Continued Problem Potential:     Dimension 6:  Recovery/Living Environment:     ASAM Severity Score:    ASAM Recommended Level of Treatment:     Substance use Disorder (SUD)    Recommendations for Services/Supports/Treatments:    DSM5 Diagnoses: Patient Active Problem List   Diagnosis Date Noted  . Schizophrenia ([place]) 09/13/2019  . Bipolar 1 disorder ([place]) 06/23/2019  . MDD (major depressive disorder) 10/10/2018  . Schizoaffective disorder, bipolar type ([place]) 09/25/2018  . Bipolar I disorder ([place]) 06/13/2018  . HTN (hypertension) 05/03/2018  . Tobacco use disorder 05/03/2018  . Bipolar I disorder, most recent episode depressed, severe without psychotic features ([place]) 05/02/2018  . Adjustment disorder with emotional disturbance 01/02/2018  . 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 Moderate bipolar I disorder, most recent episode depressed ([place])   . Psychosis ([place])   . 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 OCD (obsessive compulsive disorder) 10/05/2016  . Major depressive disorder, recurrent episode, mild ([place]) 05/04/2016  . 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 Learning disability 07/18/2015  . 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 Diabetes mellitus ([place]) 07/18/2015  . 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 Hyperlipidemia  07/18/2015  . Severe episode of recurrent major depressive disorder, without psychotic features ([place])   . Suicidal ideation   . Drug overdose   . Cognitive deficits 10/12/2012  . Generalized anxiety disorder 06/28/2012    Patient Centered Plan: Patient is on the following Treatment Plan(s):  {[facility] AMB BH OP Treatment Plans:[id number]}   Referrals to Alternative Service(s): Referred to Alternative Service(s):   Place:   Date:   Time:    Referred to Alternative Service(s):   Place:   Date:   Time:    Referred to Alternative Service(s):   Place:   Date:   Time:    Referred to Alternative Service(s):   Place:   Date:   Time:     Raymondo Band

## 2019-10-17 NOTE — BH Assessment (Signed)
Comprehensive Clinical Assessment (CCA) Screening, Triage and Referral Note  10/17/2019 [name] [id number]   Patient is a 29 y.o. female with a history of Schizophrenia, paranoid type who presented to [facility] following an overdose attempt.  She admits to taking 5(300mg ) gabapentin and 2 hydroxyzine in an attempt to take her life.  She states the trigger was learning her boyfriend of 4 months is married.  She states she thought this was an ex-wife.  She reports he took her to the home he lived in with the ex and "went in through the back door.  It was really strange."  She states this has been very upsetting and she is concerned if she leaves the hospital, she may try to harm herself again.  She also reports she has continued to shave her head, as she has been for 7 years, however lately she's had significant tingling all over her body when she shaves.  She requests medication for "nerves" and she requests inpatient treatment for her anxiety.  She states she has talked to her [facility] providers, however she feels "they aren't trying to treat her symptoms."  Patient denies HI and AVH, however she continues to endorse SI and is unable to affirm her safety at this time.  Per [name], NP continuous assessment at [facility] Urgent care is recommended.  Patient to be transferred via safe transport.     Visit Diagnosis:    ICD-10-CM   1. Suicide attempt [facility])  T14.91XA     Patient Reported Information How did you hear about [place]? Self   Referral name: No data recorded  Referral phone number: No data recorded Whom do you see for routine medical problems? Other (Comment) ([facility] ACT[facility] Team)   Practice/Facility Name: No data recorded  Practice/Facility Phone Number: No data recorded  Name of Contact: No data recorded  Contact Number: No data recorded  Contact Fax Number: No data recorded  Prescriber Name: No data recorded  Prescriber Address (if known): No data recorded What Is the  Reason for Your Visit/Call Today? Overdose attempt after learning boyfriend of 4 months is married.  How Long Has This Been Causing You Problems? <Week  Have You Recently Been in Any Inpatient Treatment (Hospital/Detox/Crisis Center/28-Day Program)? Yes (Pt admitted to OBS on 6/15)   Name/Location of Program/Hospital:[facility]   How Long Were You There? 1 day   When Were You Discharged? 10/11/19  Have You Ever Received Services From [employer] Before? Yes   Who Do You See at [facility]? [place] [facility], Inpatient Treatment  Have You Recently Had Any Thoughts About Hurting Yourself? Yes   Are You Planning to Commit Suicide/Harm Yourself At This time?  Yes  Have you Recently Had Thoughts About Hurting Someone [name]? No   Explanation: No data recorded Have You Used Any Alcohol or Drugs in the Past 24 Hours? No   How Long Ago Did You Use Drugs or Alcohol?  No data recorded  What Did You Use and How Much? No data recorded What Do You Feel Would Help You the Most Today? Therapy;Medication  Do You Currently Have a Therapist/Psychiatrist? Yes   Name of Therapist/Psychiatrist: [facility]   Have You Been Recently Discharged From Any Office Practice or Programs? No   Explanation of Discharge From Practice/Program:  No data recorded    CCA Screening Triage Referral Assessment Type of Contact: Tele-Assessment   Is this Initial or Reassessment? Initial Assessment   Date Telepsych consult ordered in  [facility]:  10/17/19   Time Telepsych consult ordered in [facility]:  [place]  Patient Reported Information Reviewed? Yes   Patient Left Without Being Seen? No data recorded  Reason for Not Completing Assessment: pt unable to be assessed currently in hallway  Collateral Involvement: None  Does Patient Have a [place]? No data recorded  Name and Contact of Legal Guardian:  Self.   If Minor and Not Living with Parent(s), Who has Custody? Self  Is CPS involved or ever been involved?  Never  Is APS involved or ever been involved? Never  Patient Determined To Be At Risk for Harm To Self or Others Based on Review of Patient Reported Information or Presenting Complaint? Yes, for Self-Harm   Method: No data recorded  Availability of Means: No data recorded  Intent: No data recorded  Notification Required: No data recorded  Additional Information for Danger to Others Potential:  No data recorded  Additional Comments for Danger to Others Potential:  No data recorded  Are There Guns or Other Weapons in Your Home?  No data recorded   Types of Guns/Weapons: No data recorded   Are These Weapons Safely Secured?                              No data recorded   Who Could Verify You Are Able To Have These Secured:    No data recorded Do You Have any Outstanding Charges, Pending Court Dates, Parole/Probation? No data recorded Contacted To Inform of Risk of Harm To Self or Others: Unable to Contact:  Location of Assessment: [facility] ED  Does Patient Present under Involuntary Commitment? No   IVC Papers Initial File Date: No data recorded  [place] of Residence: [place]  Patient Currently Receiving the Following Services: ACTT Architect)   Determination of Need: Urgent (48 hours)   Options For Referral: [facility]   Fransico Meadow

## 2019-10-17 NOTE — BHH Counselor (Signed)
TTS attempted to reach RN to initiate tele-psych. RN unavailable at this time as call continued to go back to reception. Will attempt again.

## 2019-10-17 NOTE — ED Notes (Signed)
Attempted to call TTS to facilitate eval of patient, no response.

## 2019-10-17 NOTE — ED Notes (Signed)
Lunch Tray Ordered @ 1048. °

## 2019-10-17 NOTE — ED Triage Notes (Signed)
"  I have a boyfriend  And I think he's lying to me. That's what led me to overdosing."

## 2019-10-18 LAB — GLUCOSE, CAPILLARY: Glucose-Capillary: 171 mg/dL — ABNORMAL HIGH (ref 70–99)

## 2019-10-18 MED ORDER — PROPRANOLOL HCL 10 MG PO TABS
10.0000 mg | ORAL_TABLET | Freq: Two times a day (BID) | ORAL | Status: DC
  Administered 2019-10-18: 10 mg via ORAL
  Filled 2019-10-18: qty 1

## 2019-10-18 MED ORDER — SIMVASTATIN 20 MG PO TABS: 20.0000 mg | ORAL_TABLET | Freq: Every day | ORAL | Status: DC

## 2019-10-18 MED ORDER — HYDROCHLOROTHIAZIDE 25 MG PO TABS
25.0000 mg | ORAL_TABLET | Freq: Every day | ORAL | Status: DC
  Administered 2019-10-18: 25 mg via ORAL
  Filled 2019-10-18: qty 1

## 2019-10-18 MED ORDER — GABAPENTIN 300 MG PO CAPS
300.0000 mg | ORAL_CAPSULE | Freq: Three times a day (TID) | ORAL | Status: DC
  Administered 2019-10-18: 300 mg via ORAL
  Filled 2019-10-18: qty 1

## 2019-10-18 MED ORDER — INSULIN DETEMIR 100 UNIT/ML ~~LOC~~ SOLN
40.0000 [IU] | Freq: Two times a day (BID) | SUBCUTANEOUS | Status: DC
  Administered 2019-10-18: 40 [IU] via SUBCUTANEOUS

## 2019-10-18 MED ORDER — ARIPIPRAZOLE ER 400 MG IM SRER
400.0000 mg | INTRAMUSCULAR | Status: DC
  Administered 2019-10-18: 400 mg via INTRAMUSCULAR

## 2019-10-18 MED ORDER — LITHIUM CARBONATE ER 450 MG PO TBCR
450.0000 mg | EXTENDED_RELEASE_TABLET | Freq: Two times a day (BID) | ORAL | Status: DC
  Administered 2019-10-18: 450 mg via ORAL
  Filled 2019-10-18: qty 1

## 2019-10-18 MED ORDER — HYDROXYZINE HCL 25 MG PO TABS
25.0000 mg | ORAL_TABLET | Freq: Once | ORAL | Status: AC
  Administered 2019-10-18: 25 mg via ORAL
  Filled 2019-10-18: qty 1

## 2019-10-18 MED ORDER — LAMOTRIGINE 100 MG PO TABS
100.0000 mg | ORAL_TABLET | Freq: Every day | ORAL | Status: DC
  Administered 2019-10-18: 100 mg via ORAL
  Filled 2019-10-18: qty 1

## 2019-10-18 MED ORDER — VITAMIN D3 25 MCG (1000 UNIT) PO TABS
1000.0000 [IU] | ORAL_TABLET | Freq: Every day | ORAL | Status: DC
  Administered 2019-10-18: 1000 [IU] via ORAL
  Filled 2019-10-18 (×3): qty 1

## 2019-10-18 MED ORDER — PERPHENAZINE 4 MG PO TABS
6.0000 mg | ORAL_TABLET | Freq: Two times a day (BID) | ORAL | Status: DC
  Administered 2019-10-18: 6 mg via ORAL
  Filled 2019-10-18: qty 1

## 2019-10-18 NOTE — ED Notes (Addendum)
Pt at nurses' station saying that she needs help. Pt states that she doesn't want to go home to the same situation. "I think my man is still married, but my moma said he's a good man." Pt laughing and said he took her to a house and they had sex." Pt taking about how many children she has also.

## 2019-10-18 NOTE — Discharge Instructions (Signed)
Follow up with outpatient provider Use [facility] crisis line when needed

## 2019-10-18 NOTE — ED Provider Notes (Signed)
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  Component Date Value Ref Range Status  . Sodium 08/16/2019 139  135 - 145 mmol/L Final  . Potassium 08/16/2019 3.0* 3.5 - 5.1 mmol/L Final  . Chloride 08/16/2019 101  98 - 111 mmol/L Final  . CO2 08/16/2019 27  22 - 32 mmol/L Final  . Glucose, Bld 08/16/2019 160* 70 - 99 mg/dL Final   Glucose reference range applies only to samples taken after fasting for at least 8 hours.  . BUN  08/16/2019 7  6 - 20 mg/dL Final  . Creatinine, Ser 08/16/2019 0.89  0.44 - 1.00 mg/dL Final  . Calcium 08/16/2019 9.4  8.9 - 10.3 mg/dL Final  . GFR calc non Af Amer 08/16/2019 >60  >60 mL/min Final  . GFR calc Af Amer 08/16/2019 >60  >60 mL/min Final  . Anion gap 08/16/2019 11  5 - 15 Final   Performed at [facility], [place] [address] [place], [place] [address]  . WBC 08/16/2019 13.8* 4.0 - 10.5 K/uL Final  . RBC 08/16/2019 4.89  3.87 - 5.11 MIL/uL Final  . Hemoglobin 08/16/2019 12.6  12.0 - 15.0 g/dL Final  . HCT 08/16/2019 40.8  36 - 46 % Final  . MCV 08/16/2019 83.4  80.0 - 100.0 fL Final  . MCH 08/16/2019 25.8* 26.0 - 34.0 pg Final  . MCHC 08/16/2019 30.9  30.0 - 36.0 g/dL Final  . RDW 08/16/2019 14.3  11.5 - 15.5 % Final  . Platelets 08/16/2019 252  150 - 400 K/uL Final   REPEATED TO VERIFY  . nRBC 08/16/2019 0.0  0.0 - 0.2 % Final   Performed at [facility], [place] [address], [place], [place] [address]  . Troponin I (High Sensitivity) 08/16/2019 5  <18 ng/L Final   Comment: (NOTE) Elevated high sensitivity troponin I (hsTnI) values and significant  changes across serial measurements may suggest ACS but many other  chronic and acute conditions are known to elevate hsTnI results.  Refer to the "Links" section for chest pain algorithms and additional  guidance. Performed at [facility], [place] [address] [place], [place] [address]   . I-stat hCG, quantitative 08/16/2019 <5.0  <5 mIU/mL Final  . Comment 3 08/16/2019          Final   Comment:   GEST. AGE      CONC.  (mIU/mL)   <=1 WEEK        5 - 50     2 WEEKS       50 - 500     3 WEEKS       100 - 10,000     4 WEEKS     1,000 - 30,000        FEMALE AND NON-PREGNANT FEMALE:     LESS THAN 5 mIU/mL   . Troponin I (High Sensitivity) 08/16/2019 4  <18 ng/L Final   Comment: (NOTE) Elevated high sensitivity troponin I (hsTnI) values and significant  changes across serial measurements may suggest ACS but many  other  chronic and acute conditions are known to elevate hsTnI results.  Refer to the "Links" section for chest pain algorithms and additional  guidance. Performed at [facility], [place] [address], [place], [place] [address]   Admission on 07/29/2019, Discharged on 07/30/2019  Component Date Value Ref Range Status  . SARS Coronavirus 2 by RT PCR 07/29/2019 NEGATIVE  NEGATIVE Final   Comment: (NOTE) SARS-CoV-2 target nucleic acids are NOT DETECTED. The SARS-CoV-2 RNA is generally detectable in upper respiratoy specimens during the acute phase of infection. The lowest concentration of SARS-CoV-2 viral copies this assay can detect is 131 copies/mL. A negative result does not preclude SARS-Cov-2 infection and should not be used as the sole basis for treatment or other patient management decisions. A negative result may occur with  improper specimen collection/handling, submission of specimen other than nasopharyngeal swab, presence of viral mutation(s) within the areas targeted by this assay, and inadequate number of viral copies (<131 copies/mL). A negative result must be combined with clinical observations, patient history, and epidemiological information. The expected result is Negative. Fact Sheet for Patients:  PinkCheek.be Fact Sheet for Healthcare Providers:  GravelBags.it This test is not yet ap                          proved or cleared by the [place] FDA and  has been authorized for detection and/or diagnosis of SARS-CoV-2 by FDA under an Emergency Use Authorization (EUA). This EUA will remain  in effect (meaning this test can be used) for the duration of the COVID-19 declaration under Section 564(b)(1) of the Act, 21 U.S.C. section 360bbb-3(b)(1), unless the authorization is terminated or revoked sooner.   . Influenza A by PCR 07/29/2019 NEGATIVE  NEGATIVE Final  . Influenza B by PCR 07/29/2019 NEGATIVE   NEGATIVE Final   Comment: (NOTE) The Xpert Xpress SARS-CoV-2/FLU/RSV assay is intended as an aid in  the diagnosis of influenza from Nasopharyngeal swab specimens and  should not be used as a sole basis for treatment. Nasal washings and  aspirates are unacceptable for Xpert Xpress SARS-CoV-2/FLU/RSV  testing. Fact Sheet for Patients: PinkCheek.be Fact Sheet for Healthcare Providers: GravelBags.it This test is not yet approved or cleared by the [place] FDA and  has been authorized for detection and/or diagnosis of SARS-CoV-2 by  FDA under an Emergency Use Authorization (EUA). This EUA will remain  in effect (meaning this test can be used) for the duration of the  Covid-19 declaration under Section 564(b)(1) of the Act, 21  U.S.C. section 360bbb-3(b)(1), unless the authorization is  terminated or revoked. Performed at [facility], [place] [address] [place], [place] [address]   . Glucose-Capillary 07/30/2019 155* 70 - 99 mg/dL Final   Glucose reference range applies only to samples taken after fasting for at least 8 hours.  . Glucose-Capillary 07/30/2019 126* 70 - 99 mg/dL Final   Glucose reference range applies only to samples taken after fasting for at least 8 hours.  Admission on 07/13/2019, Discharged on 07/14/2019  Component Date Value Ref Range Status  . SARS Coronavirus 2 by RT PCR 07/14/2019 NEGATIVE  NEGATIVE Final   Comment: (NOTE) SARS-CoV-2 target nucleic acids are NOT DETECTED. The SARS-CoV-2 RNA is generally detectable in upper respiratoy specimens during the acute phase of infection. The lowest concentration of SARS-CoV-2 viral copies this assay can detect is 131 copies/mL. A negative result does not preclude SARS-Cov-2 infection and should not be used as the sole basis for treatment or other patient management decisions. A negative result may occur with  improper specimen  collection/handling, submission  of specimen other than nasopharyngeal swab, presence of viral mutation(s) within the areas targeted by this assay, and inadequate number of viral copies (<131 copies/mL). A negative result must be combined with clinical observations, patient history, and epidemiological information. The expected result is Negative. Fact Sheet for Patients:  PinkCheek.be Fact Sheet for Healthcare Providers:  GravelBags.it This test is not yet ap                          proved or cleared by the [place] FDA and  has been authorized for detection and/or diagnosis of SARS-CoV-2 by FDA under an Emergency Use Authorization (EUA). This EUA will remain  in effect (meaning this test can be used) for the duration of the COVID-19 declaration under Section 564(b)(1) of the Act, 21 U.S.C. section 360bbb-3(b)(1), unless the authorization is terminated or revoked sooner.   . Influenza A by PCR 07/14/2019 NEGATIVE  NEGATIVE Final  . Influenza B by PCR 07/14/2019 NEGATIVE  NEGATIVE Final   Comment: (NOTE) The Xpert Xpress SARS-CoV-2/FLU/RSV assay is intended as an aid in  the diagnosis of influenza from Nasopharyngeal swab specimens and  should not be used as a sole basis for treatment. Nasal washings and  aspirates are unacceptable for Xpert Xpress SARS-CoV-2/FLU/RSV  testing. Fact Sheet for Patients: PinkCheek.be Fact Sheet for Healthcare Providers: GravelBags.it This test is not yet approved or cleared by the [place] FDA and  has been authorized for detection and/or diagnosis of SARS-CoV-2 by  FDA under an Emergency Use Authorization (EUA). This EUA will remain  in effect (meaning this test can be used) for the duration of the  Covid-19 declaration under Section 564(b)(1) of the Act, 21  U.S.C. section 360bbb-3(b)(1), unless the authorization is   terminated or revoked. Performed at [facility] [place] [address], [place], [place] [address]   . Sodium 07/14/2019 142  135 - 145 mmol/L Final  . Potassium 07/14/2019 3.7  3.5 - 5.1 mmol/L Final  . Chloride 07/14/2019 108  98 - 111 mmol/L Final  . CO2 07/14/2019 27  22 - 32 mmol/L Final  . Glucose, Bld 07/14/2019 102* 70 - 99 mg/dL Final   Glucose reference range applies only to samples taken after fasting for at least 8 hours.  . BUN 07/14/2019 12  6 - 20 mg/dL Final  . Creatinine, Ser 07/14/2019 0.74  0.44 - 1.00 mg/dL Final  . Calcium 07/14/2019 9.1  8.9 - 10.3 mg/dL Final  . Total Protein 07/14/2019 6.7  6.5 - 8.1 g/dL Final  . Albumin 07/14/2019 3.7  3.5 - 5.0 g/dL Final  . AST 07/14/2019 20  15 - 41 U/L Final  . ALT 07/14/2019 19  0 - 44 U/L Final  . Alkaline Phosphatase 07/14/2019 33* 38 - 126 U/L Final  . Total Bilirubin 07/14/2019 0.7  0.3 - 1.2 mg/dL Final  . GFR calc non Af Amer 07/14/2019 >60  >60 mL/min Final  . GFR calc Af Amer 07/14/2019 >60  >60 mL/min Final  . Anion gap 07/14/2019 7  5 - 15 Final   Performed at [facility], [place] [address] [place], [place] [address]  . Magnesium 07/14/2019 2.1  1.7 - 2.4 mg/dL Final   Performed at [place] [address], [place], [place] [address]  . Hgb A1c MFr Bld 07/14/2019 5.9* 4.8 - 5.6 % Final   Comment: (NOTE) Pre diabetes:  5.7%-6.4% Diabetes:              >6.4% Glycemic control for   <7.0% adults with diabetes   . Mean Plasma Glucose 07/14/2019 122.63  mg/dL Final   Performed at [place] [address] [place], [place] [address]  . Alcohol, Ethyl (B) 07/14/2019 <10  <10 mg/dL Final   Comment: (NOTE) Lowest detectable limit for serum alcohol is 10 mg/dL. For medical purposes only. Performed at [facility], [place] [address] [place], [place] [address]   . Cholesterol 07/14/2019 141  0 - 200 mg/dL Final  .  Triglycerides 07/14/2019 76  <150 mg/dL Final  . HDL 07/14/2019 49  >40 mg/dL Final  . Total CHOL/HDL Ratio 07/14/2019 2.9  RATIO Final  . VLDL 07/14/2019 15  0 - 40 mg/dL Final  . LDL Cholesterol 07/14/2019 77  0 - 99 mg/dL Final   Comment:        Total Cholesterol/HDL:CHD Risk Coronary Heart Disease Risk Table                     Men   Women  1/2 Average Risk   3.4   3.3  Average Risk       5.0   4.4  2 X Average Risk   9.6   7.1  3 X Average Risk  23.4   11.0        Use the calculated Patient Ratio above and the CHD Risk Table to determine the patient's CHD Risk.        ATP III CLASSIFICATION (LDL):  <100     mg/dL   Optimal  100-129  mg/dL   Near or Above                    Optimal  130-159  mg/dL   Borderline  160-189  mg/dL   High  >190     mg/dL   Very High Performed at [place] [address], [place], [place] [address]   . Total Protein 07/14/2019 6.6  6.5 - 8.1 g/dL Final  . Albumin 07/14/2019 3.8  3.5 - 5.0 g/dL Final  . AST 07/14/2019 19  15 - 41 U/L Final  . ALT 07/14/2019 18  0 - 44 U/L Final  . Alkaline Phosphatase 07/14/2019 32* 38 - 126 U/L Final  . Total Bilirubin 07/14/2019 0.6  0.3 - 1.2 mg/dL Final  . Bilirubin, Direct 07/14/2019 0.1  0.0 - 0.2 mg/dL Final  . Indirect Bilirubin 07/14/2019 0.5  0.3 - 0.9 mg/dL Final   Performed at [place] [address] [place], [place] [address]  . TSH 07/14/2019 0.959  0.350 - 4.500 uIU/mL Final   Comment: Performed by a 3rd Generation assay with a functional sensitivity of <=0.01 uIU/mL. Performed at [facility], [place] [address] [place],  [address]   . Glucose-Capillary 07/14/2019 128* 70 - 99 mg/dL Final   Glucose reference range applies only to samples taken after fasting for at least 8 hours.  There may be more visits with results that are not included.    Blood Alcohol level:  Lab Results  Component Value Date   [facility] <10 10/16/2019    ETH <10 94/49/6759    Metabolic Disorder Labs: Lab Results  Component Value Date   HGBA1C 6.3 (H) 10/10/2019   MPG 134.11 10/10/2019   MPG 122.63 07/14/2019   Lab Results  Component  Value Date   PROLACTIN 5.3 05/19/2019   PROLACTIN 5.2 10/11/2018   Lab Results  Component Value Date   CHOL 141 07/14/2019   TRIG 76 07/14/2019   HDL 49 07/14/2019   CHOLHDL 2.9 07/14/2019   VLDL 15 07/14/2019   LDLCALC 77 07/14/2019   LDLCALC 85 06/23/2019    Therapeutic Lab Levels: Lab Results  Component Value Date   LITHIUM 0.39 (L) 10/16/2019   LITHIUM 0.78 10/10/2019   Lab Results  Component Value Date   VALPROATE <10 (L) 08/02/2017   No components found for:  CBMZ  Physical Findings   AIMS     Admission (Discharged) from OP Visit from 09/12/2019 in [place] Admission (Discharged) from OP Visit from 07/29/2019 in [place] Admission (Discharged) from OP Visit from 07/13/2019 in [place] Admission (Discharged) from 06/23/2019 in [place] Admission (Discharged) from OP Visit from 06/11/2019 in [place] Total Score 0 0 0 0 0    AUDIT     Admission (Discharged) from OP Visit from 09/12/2019 in [facility] [place] Admission (Discharged) from OP Visit from 07/29/2019 in [place] Admission (Discharged) from OP Visit from 07/13/2019 in [place] Admission (Discharged) from OP Visit from 06/11/2019 in [place] Admission (Discharged) from OP Visit from 05/29/2019 in [place] [facility]  Alcohol Use Disorder Identification Test Final Score (AUDIT) 0 0 4 3 3     GAD-7     Office Visit from 05/19/2018 in [place] for [facility] Procedure visit from 03/18/2018 in [place] for [facility] Office Visit from 01/12/2018 in [place] for  [facility] Procedure visit from 01/13/2017 in [place] for [facility] Office Visit from 12/27/2015 in [place] for [facility]  Total GAD-7 Score 6 7 5 1  0    PHQ2-9     Counselor from 10/17/2019 in [facility] ED from 10/10/2019 in [facility] Office Visit from 05/19/2018 in [employer] for [facility] Procedure visit from 03/18/2018 in [place] for [facility] Office Visit from 01/12/2018 in [place] for [facility]  PHQ-2 Total Score 5 0 2 1 3   PHQ-9 Total Score 14 -- 4 4 12     SBQ-R     Counselor from 10/17/2019 in [facility]  SBQ-R Total Score 13       Musculoskeletal  Strength & Muscle Tone: within normal limits Gait & Station: normal Patient leans: N/A  Psychiatric Specialty Exam  Presentation  General Appearance: Disheveled  Eye Contact:Good  Speech:Pressured  Speech Volume:Increased  Handedness:Right   Mood and Affect  Mood:Anxious;Labile  Affect:Labile   Thought Process  Thought Processes:Coherent;Goal Directed (Goal directewd on admission to hospital)  Descriptions of Associations:Intact  Orientation:Full (Time, Place and Person)  Thought Content:WDL  Hallucinations:Hallucinations: None  Ideas of Reference:None  Suicidal Thoughts:No data recorded Homicidal Thoughts:Homicidal Thoughts: No   Sensorium  Memory:Immediate Good;Recent Good;Remote Good  Judgment:Fair  Insight:Fair   Executive Functions  Concentration:Fair  Attention Span:Fair  Tobias   Psychomotor Activity  Psychomotor Activity:Psychomotor Activity: Normal   Assets  Assets:Desire for Improvement;Financial Resources/Insurance;Housing   Sleep  Sleep:Sleep: Good   Physical Exam  Physical Exam Vitals and nursing note reviewed.  Constitutional:       Appearance: She is well-developed.  Cardiovascular:  Rate and Rhythm: Normal rate.  Pulmonary:     Effort: Pulmonary effort is normal.  Musculoskeletal:        General: Normal range of motion.  Skin:    General: Skin is warm.  Neurological:     Mental Status: She is alert and oriented to person, place, and time.  Psychiatric:        Mood and Affect: Mood is anxious. Affect is labile.        Speech: Speech is rapid and pressured.        Judgment: Judgment is impulsive.    Review of Systems  Constitutional: Negative.   HENT: Negative.   Eyes: Negative.   Respiratory: Negative.   Cardiovascular: Negative.   Gastrointestinal: Negative.   Genitourinary: Negative.   Musculoskeletal: Negative.   Skin: Negative.   Neurological: Negative.   Endo/Heme/Allergies: Negative.   Psychiatric/Behavioral: The patient is nervous/anxious.    Blood pressure 127/78, pulse 70, temperature 97.7 F (36.5 C), temperature source Tympanic, resp. rate 18, height 5\' 7"  (1.702 m), weight 118.4 kg, SpO2 99 %. Body mass index is 40.88 kg/m.   Assessment: Patient presents appearing to be very anxious and is labile and goes from crying to calm.  Patient appears to be very upset about being in the hospital and not wanting to go home. Patient was restarted on her home medications and is now sleeping. Per patient she has not had any medications in about 2 days. Once patient has been able to take her medications and stabilize will reassess for discharge. She is frequent to the hospital and will contact [facility] for assistance as well.  Treatment Plan Summary: Restart home medications and allow patient to calm down. Will reassess after patient wakes up. Feel that patient is seeking secondary gain from hospital stay.  [place], FNP 10/18/2019 12:11 PM

## 2019-10-18 NOTE — ED Notes (Signed)
Patient resting comfortably in chair. Continue to endorse SI but denies HI. Pt states, "I need to go to the hospital and stay because my boyfriend and I are having problems. I think he is married". RN attempted educating pt on parameters for admission to inpatient hospital but patient continue to request inpatient stay. Meals and fluids offered. Pt showered this am. No acute distress noted. Pt seen by NP. At present, pt denies concerns. Safety maintained.

## 2019-10-18 NOTE — ED Notes (Signed)
Pt still at nurses' station talking loudly and laughing saying how she has had many boyfriends and they treated her good. Pt also shouts "[employer]!"

## 2019-10-18 NOTE — ED Notes (Signed)
Pt at nurses station talking about sex and how her boyfriend doesn't trust her. "He must think I'm a whore because how I dress and wear makeup and lipstick and nail polish and wigs." I had Chlamydia once but that's all. I don't want AIDS or anything."

## 2019-10-18 NOTE — ED Notes (Addendum)
Pt alert and oriented on the unit. Education, support, and encouragement provided. Discharge summary, medications and follow up appointments reviewed with pt. Suicide prevention resources provided. Pt's belongings in locker #3 returned and belongings sheet signed. Pt denies SI/HI, A/VH, pain, or any concerns at this time. Pt ambulatory on and off unit. Pt discharged to lobby and transported home by [place].

## 2019-10-18 NOTE — ED Notes (Addendum)
Pt at nurses's station stating that she needs to loose weight and not eat fattening or fried foods. "My friend told me that I need to walk." "I think he thinks I'm fat!" Pt's bursts into laughter.Pt also speaking loudly to everyone that comes onto the unit. Pt flexed her bicep and said, "Look I got muscles."

## 2019-10-18 NOTE — ED Provider Notes (Signed)
FBC/OBS ASAP Discharge Summary  Date and Time: 10/18/2019 2:08 PM  Name: [name]  MRN:  [id number]   Discharge Diagnoses:  Final diagnoses:  Schizoaffective disorder, bipolar type [facility])  Impulse control disorder  Adjustment disorder with emotional disturbance    Subjective: Patient reports that she is feeling much better since she got her medications and was able to sleep some more.  Patient denies any suicidal or homicidal ideations and denies any hallucinations.  Patient was excited to get back on her medications even though she stated that she was taking them as prescribed at home.  She reports that the biggest thing she needs is some assistance with transportation back to her house.  Stay Summary: Patient remained overnight in observation unit and this morning was appearing very anxious and labile.  Patient was restarted on her medications and went to sleep.  Once patient woke up she was doing much better.  Patient has been talking with staff and laughing and joking.  Patient is very pleasant, calm, cooperative at this time.  [facility] was contacted as they are still current with the patient's treatment plan.  They did confirm the patient's medications as they have been ordered below.  Patient will be given her Abilify Maintena 400 mg IM injection today prior to discharge. [facility] also report that the patient does frequent the emergency department as well as urgent care facilities in an attempt to be admitted to hospitals.  They state that she enjoys being admitted and she does have a long history of reporting [facility] for possible overdoses with no confirmation so that she can be admitted to hospitals.  They feel that the patient is of no threat and can be discharged home safely.  [facility] stated they will follow up with the patient.  We will request nursing staff to contact safe transport to provide patient transportation to her house.  Total Time spent with patient: 30 minutes  Past  Psychiatric History: Patient has a history of bipolar disorder type I, most recently depressed severe with psychotic features. Her last psychiatric hospitalization at our facility was on 09/12/2019 to 09/13/2019. This was an observation overnight, and she was discharged the next day. She has had multiple psychiatric hospitalizations in the past Past Medical History:  Past Medical History:  Diagnosis Date  . Anxiety   . Bipolar 1 disorder ([place])   . Cognitive deficits   . Depression   . Diabetes mellitus without complication ([place])   . Hypertension   . Mental disorder   . Mental health disorder   . Obesity     Past Surgical History:  Procedure Laterality Date  . CESAREAN SECTION    . CESAREAN SECTION N/A 04/25/2013   Procedure: REPEAT CESAREAN SECTION;  Surgeon: [name], MD;  Location: [facility];  Service: Obstetrics;  Laterality: N/A;  . MASS EXCISION N/A 06/03/2012   Procedure: EXCISION MASS;  Surgeon: [name], MD;  Location: [facility];  Service: ENT;  Laterality: N/A;  Excision uvula mass  . TONSILLECTOMY N/A 06/03/2012   Procedure: TONSILLECTOMY;  Surgeon: [name], MD;  Location: [place];  Service: ENT;  Laterality: N/A;  . TONSILLECTOMY     Family History:  Family History  Problem Relation Age of Onset  . Hypertension Mother   . Diabetes Father    Family Psychiatric History: None reported Social History:  Social History   Substance and Sexual Activity  Alcohol Use Not Currently   Comment:  occ: last intake 4 mts ago     Social History   Substance and Sexual Activity  Drug Use Not Currently  . Types: "Crack" cocaine, Other-see comments   Comment: Patient reports hx of smoking Crack    Social History   Socioeconomic History  . Marital status: Single    Spouse name: Not on file  . Number of children: Not on file  . Years of education: Not on file  . Highest education level: Not on file  Occupational History  . Not  on file  Tobacco Use  . Smoking status: Current Every Day Smoker    Packs/day: 4.00    Years: 11.00    Pack years: 44.00    Types: Cigarettes  . Smokeless tobacco: Never Used  . Tobacco comment: Pt declined  Vaping Use  . Vaping Use: Never used  Substance and Sexual Activity  . Alcohol use: Not Currently    Comment: occ: last intake 4 mts ago  . Drug use: Not Currently    Types: "Crack" cocaine, Other-see comments    Comment: Patient reports hx of smoking Crack  . Sexual activity: Yes    Birth control/protection: Implant  Other Topics Concern  . Not on file  Social History Narrative   ** Merged History Encounter **       Social Determinants of Health   Financial Resource Strain:   . Difficulty of Paying Living Expenses:   Food Insecurity:   . Worried About Charity fundraiser in the Last Year:   . Arboriculturist in the Last Year:   Transportation Needs:   . Film/video editor (Medical):   Marland Kitchen Lack of Transportation (Non-Medical):   Physical Activity:   . Days of Exercise per Week:   . Minutes of Exercise per Session:   Stress:   . Feeling of Stress :   Social Connections:   . Frequency of Communication with Friends and Family:   . Frequency of Social Gatherings with Friends and Family:   . Attends Religious Services:   . Active Member of Clubs or Organizations:   . Attends Archivist Meetings:   Marland Kitchen Marital Status:    SDOH:  SDOH Screenings   Alcohol Screen: Low Risk   . Last Alcohol Screening Score (AUDIT): 0  Depression (PHQ2-9): Medium Risk  . PHQ-2 Score: 14  Financial Resource Strain:   . Difficulty of Paying Living Expenses:   Food Insecurity:   . Worried About Charity fundraiser in the Last Year:   . Nettie in the Last Year:   Housing:   . Last Housing Risk Score:   Physical Activity:   . Days of Exercise per Week:   . Minutes of Exercise per Session:   Social Connections:   . Frequency of Communication with Friends and  Family:   . Frequency of Social Gatherings with Friends and Family:   . Attends Religious Services:   . Active Member of Clubs or Organizations:   . Attends Archivist Meetings:   Marland Kitchen Marital Status:   Stress:   . Feeling of Stress :   Tobacco Use: High Risk  . Smoking Tobacco Use: Current Every Day Smoker  . Smokeless Tobacco Use: Never Used  Transportation Needs:   . Film/video editor (Medical):   Marland Kitchen Lack of Transportation (Non-Medical):     Has this patient used any form of tobacco in the last 30 days? (Cigarettes, Smokeless Tobacco,  Cigars, and/or Pipes) Prescription not provided because: doesn't smoke  Current Medications:  Current Facility-Administered Medications  Medication Dose Route Frequency Provider Last Rate Last Admin  . acetaminophen (TYLENOL) tablet 650 mg  650 mg Oral Q6H PRN Anike, Adaku C, NP      . ARIPiprazole ER (ABILIFY MAINTENA) injection 400 mg  400 mg Intramuscular Q28 days Fidela Cieslak, Lowry Ram, FNP      . cholecalciferol (VITAMIN D) tablet 1,000 Units  1,000 Units Oral Daily Jumanah Hynson, Darnelle Maffucci B, FNP      . gabapentin (NEURONTIN) capsule 300 mg  300 mg Oral TID Fatuma Dowers, Lowry Ram, FNP   300 mg at 10/18/19 0959  . hydrochlorothiazide (HYDRODIURIL) tablet 25 mg  25 mg Oral Daily Lateria Alderman, Lowry Ram, FNP   25 mg at 10/18/19 1000  . insulin detemir (LEVEMIR) injection 40 Units  40 Units Subcutaneous BID Layne Dilauro, Lowry Ram, FNP   40 Units at 10/18/19 1001  . lamoTRIgine (LAMICTAL) tablet 100 mg  100 mg Oral Daily Tinleigh Whitmire B, FNP      . lithium carbonate (ESKALITH) CR tablet 450 mg  450 mg Oral Q12H Ramina Hulet, Lowry Ram, FNP   450 mg at 10/18/19 1000  . perphenazine (TRILAFON) tablet 6 mg  6 mg Oral BID Darshawn Boateng, Lowry Ram, FNP      . propranolol (INDERAL) tablet 10 mg  10 mg Oral BID Verl Kitson, Lowry Ram, FNP   10 mg at 10/18/19 0959  . simvastatin (ZOCOR) tablet 20 mg  20 mg Oral QHS Mikel Hardgrove, Lowry Ram, FNP       Current Outpatient Medications  Medication Sig Dispense Refill  .  ARIPiprazole ER (ABILIFY MAINTENA) 400 MG SRER injection Inject 2 mLs (400 mg total) into the muscle every 28 (twenty-eight) days. 1 each 11  . cholecalciferol (VITAMIN D) 25 MCG (1000 UNIT) tablet Take 1,000 Units by mouth daily.    Marland Kitchen gabapentin (NEURONTIN) 300 MG capsule Take 300 mg by mouth 3 (three) times daily as needed.     Marland Kitchen glipiZIDE (GLUCOTROL XL) 10 MG 24 hr tablet Take 10 mg by mouth 2 (two) times daily before lunch and supper. (Patient not taking: Reported on 10/16/2019)    . hydrochlorothiazide (HYDRODIURIL) 25 MG tablet Take 25 mg by mouth daily.    Marland Kitchen lamoTRIgine (LAMICTAL) 100 MG tablet Take 100 mg by mouth daily.    Marland Kitchen LEVEMIR FLEXTOUCH 100 UNIT/ML FlexPen Inject 40 Units into the skin 2 (two) times daily.     Marland Kitchen lithium carbonate (ESKALITH) 450 MG CR tablet Take 2 tablets (900 mg total) by mouth at bedtime. 60 tablet 2  . perphenazine (TRILAFON) 2 MG tablet Take 6 mg by mouth 2 (two) times daily.    . propranolol (INDERAL) 10 MG tablet Take 1 tablet (10 mg total) by mouth 2 (two) times daily. 60 tablet 2  . simvastatin (ZOCOR) 20 MG tablet Take 20 mg by mouth at bedtime.      PTA Medications: (Not in a hospital admission)   Musculoskeletal  Strength & Muscle Tone: within normal limits Gait & Station: normal Patient leans: N/A  Psychiatric Specialty Exam  Presentation  General Appearance: Appropriate for Environment;Casual  Eye Contact:Good  Speech:Clear and Coherent;Normal Rate  Speech Volume:Normal  Handedness:Right   Mood and Affect  Mood:Euthymic  Affect:Congruent;Appropriate   Thought Process  Thought Processes:Coherent  Descriptions of Associations:Intact  Orientation:Full (Time, Place and Person)  Thought Content:WDL  Hallucinations:Hallucinations: None  Ideas of Reference:None  Suicidal Thoughts:Suicidal Thoughts: No  Homicidal  Thoughts:Homicidal Thoughts: No   Sensorium  Memory:Immediate Good;Recent Good;Remote  Good  Judgment:Fair  Insight:Fair   Executive Functions  Concentration:Good  Attention Span:Good  [place]  Language:Good   Psychomotor Activity  Psychomotor Activity:Psychomotor Activity: Normal   Assets  Assets:Communication Skills;Desire for Improvement;Financial Resources/Insurance;Housing;Social Support;Physical Health;Transportation   Sleep  Sleep:Sleep: Good   Physical Exam  Physical Exam Vitals and nursing note reviewed.  Constitutional:      Appearance: She is well-developed.  Cardiovascular:     Rate and Rhythm: Normal rate.  Pulmonary:     Effort: Pulmonary effort is normal.  Musculoskeletal:        General: Normal range of motion.  Skin:    General: Skin is warm.  Neurological:     Mental Status: She is alert and oriented to person, place, and time.    Review of Systems  Constitutional: Negative.   HENT: Negative.   Eyes: Negative.   Respiratory: Negative.   Cardiovascular: Negative.   Gastrointestinal: Negative.   Genitourinary: Negative.   Musculoskeletal: Negative.   Skin: Negative.   Neurological: Negative.   Endo/Heme/Allergies: Negative.   Psychiatric/Behavioral: Negative.    Blood pressure 127/78, pulse 70, temperature 97.7 F (36.5 C), temperature source Tympanic, resp. rate 18, height 5\' 7"  (1.702 m), weight 261 lb (118.4 kg), SpO2 99 %. Body mass index is 40.88 kg/m.  Demographic Factors:  Low socioeconomic status  Loss Factors: NA  Historical Factors: Impulsivity  Risk Reduction Factors:   Positive social support, Positive therapeutic relationship and Positive coping skills or problem solving skills  Continued Clinical Symptoms:  Previous Psychiatric Diagnoses and Treatments  Cognitive Features That Contribute To Risk:  None    Suicide Risk:  Minimal: No identifiable suicidal ideation.  Patients presenting with no risk factors but with morbid ruminations; may be classified as minimal  risk based on the severity of the depressive symptoms  Plan Of Care/Follow-up recommendations:  Continue activity as tolerated. Continue diet as recommended by your PCP. Ensure to keep all appointments with outpatient providers.  Disposition: Discharge home. [facility] contacted that patient is discharging  Lewis Shock, FNP 10/18/2019, 2:08 PM

## 2019-11-03 ENCOUNTER — Emergency Department (HOSPITAL_COMMUNITY)
Admission: EM | Admit: 2019-11-03 | Discharge: 2019-11-03 | Disposition: A | Discharge status: Home / Self Care | Payer: Medicaid Other | Attending: Emergency Medicine | Admitting: Emergency Medicine

## 2019-11-03 ENCOUNTER — Encounter (HOSPITAL_COMMUNITY): Payer: Self-pay | Admitting: Emergency Medicine

## 2019-11-03 DIAGNOSIS — F17210 Nicotine dependence, cigarettes, uncomplicated: Secondary | ICD-10-CM | POA: Insufficient documentation

## 2019-11-03 DIAGNOSIS — Z79899 Other long term (current) drug therapy: Secondary | ICD-10-CM | POA: Diagnosis not present

## 2019-11-03 DIAGNOSIS — E119 Type 2 diabetes mellitus without complications: Secondary | ICD-10-CM | POA: Diagnosis not present

## 2019-11-03 DIAGNOSIS — Z7984 Long term (current) use of oral hypoglycemic drugs: Secondary | ICD-10-CM | POA: Diagnosis not present

## 2019-11-03 DIAGNOSIS — I10 Essential (primary) hypertension: Secondary | ICD-10-CM | POA: Insufficient documentation

## 2019-11-03 DIAGNOSIS — G44209 Tension-type headache, unspecified, not intractable: Secondary | ICD-10-CM | POA: Insufficient documentation

## 2019-11-03 DIAGNOSIS — R519 Headache, unspecified: Secondary | ICD-10-CM | POA: Diagnosis present

## 2019-11-03 LAB — CBG MONITORING, ED: Glucose-Capillary: 127 mg/dL — ABNORMAL HIGH (ref 70–99)

## 2019-11-03 MED ORDER — ACETAMINOPHEN 325 MG PO TABS
650.0000 mg | ORAL_TABLET | Freq: Once | ORAL | Status: AC
  Administered 2019-11-03: 650 mg via ORAL
  Filled 2019-11-03: qty 2

## 2019-11-03 NOTE — ED Provider Notes (Signed)
[facility] DEPT Provider Note   CSN: [id number] Arrival date & time: 11/03/19  1137     History Chief Complaint  Patient presents with  . Headache    [name] is a 29 y.o. female.  The history is provided by the patient. No language interpreter was used.  Headache Pain location:  Frontal Quality:  Sharp Onset quality:  Gradual Duration:  1 day Timing:  Constant Progression:  Worsening Relieved by:  Nothing Worsened by:  Nothing Ineffective treatments:  None tried Associated symptoms: no weakness   Pt reports she developed a headache yesterday.  Pt thins her blood pressure may be high.  Pt also reports increased salt intake.      Past Medical History:  Diagnosis Date  . Anxiety   . Bipolar 1 disorder ([place])   . Cognitive deficits   . Depression   . Diabetes mellitus without complication ([place])   . Hypertension   . Mental disorder   . Mental health disorder   . Obesity     Patient Active Problem List   Diagnosis Date Noted  . Schizophrenia ([place]) 09/13/2019  . Bipolar 1 disorder ([place]) 06/23/2019  . MDD (major depressive disorder) 10/10/2018  . Schizoaffective disorder, bipolar type ([place]) 09/25/2018  . Bipolar I disorder ([place]) 06/13/2018  . HTN (hypertension) 05/03/2018  . Tobacco use disorder 05/03/2018  . Bipolar I disorder, most recent episode depressed, severe without psychotic features ([place]) 05/02/2018  . Adjustment disorder with emotional disturbance 01/02/2018  . 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 Moderate bipolar I disorder, most recent episode depressed ([place])   . Psychosis ([place])   . 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 OCD (obsessive compulsive disorder) 10/05/2016  . Major depressive disorder, recurrent episode, mild ([place]) 05/04/2016  . 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 Learning disability 07/18/2015  .  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 Diabetes mellitus ([place]) 07/18/2015  . 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 Hyperlipidemia 07/18/2015  . Severe episode of recurrent major depressive disorder, without psychotic features ([place])   . Suicidal ideation   . Drug overdose   . Cognitive deficits 10/12/2012  . Generalized anxiety disorder 06/28/2012    Past Surgical History:  Procedure Laterality Date  . CESAREAN SECTION    . CESAREAN SECTION N/A 04/25/2013   Procedure: REPEAT CESAREAN SECTION;  Surgeon: [name], MD;  Location: [facility];  Service: Obstetrics;  Laterality: N/A;  . MASS EXCISION N/A 06/03/2012   Procedure: EXCISION MASS;  Surgeon: [name], MD;  Location: [facility];  Service: ENT;  Laterality: N/A;  Excision uvula mass  . TONSILLECTOMY N/A 06/03/2012   Procedure: TONSILLECTOMY;  Surgeon: [name], MD;  Location: [facility];  Service: ENT;  Laterality: N/A;  . TONSILLECTOMY       OB History    Gravida  3   Para  3   Term  3   Preterm  0   AB  0   Living  3     SAB  0   TAB  0   Ectopic  0   Multiple      Live Births  3           Family History  Problem Relation Age of Onset  . Hypertension Mother   . Diabetes Father     Social History   Tobacco Use  .  Smoking status: Current Every Day Smoker    Packs/day: 4.00    Years: 11.00    Pack years: 44.00    Types: Cigarettes  . Smokeless tobacco: Never Used  . Tobacco comment: Pt declined  Vaping Use  . Vaping Use: Never used  Substance Use Topics  . Alcohol use: Not Currently    Comment: occ: last intake 4 mts ago  . Drug use: Not Currently    Types: "Crack" cocaine, Other-see comments    Comment: Patient reports hx of smoking Crack    Home Medications Prior to Admission medications   Medication Sig Start Date End Date Taking? Authorizing Provider  ARIPiprazole ER (ABILIFY MAINTENA) 400 MG  SRER injection Inject 2 mLs (400 mg total) into the muscle every 28 (twenty-eight) days. 06/28/19   Johnn Hai, MD  cholecalciferol (VITAMIN D) 25 MCG (1000 UNIT) tablet Take 1,000 Units by mouth daily. 08/21/19   [provider]  gabapentin (NEURONTIN) 300 MG capsule Take 300 mg by mouth 3 (three) times daily as needed.  08/21/19   [provider]  glipiZIDE (GLUCOTROL XL) 10 MG 24 hr tablet Take 10 mg by mouth 2 (two) times daily before lunch and supper. Patient not taking: Reported on 10/16/2019    [provider]  hydrochlorothiazide (HYDRODIURIL) 25 MG tablet Take 25 mg by mouth daily. 08/21/19   [provider]  lamoTRIgine (LAMICTAL) 100 MG tablet Take 100 mg by mouth daily. 08/21/19   [provider]  LEVEMIR FLEXTOUCH 100 UNIT/ML FlexPen Inject 40 Units into the skin 2 (two) times daily.  08/09/19   [provider]  lithium carbonate (ESKALITH) 450 MG CR tablet Take 2 tablets (900 mg total) by mouth at bedtime. 06/02/19   Johnn Hai, MD  perphenazine (TRILAFON) 2 MG tablet Take 6 mg by mouth 2 (two) times daily.    [provider]  propranolol (INDERAL) 10 MG tablet Take 1 tablet (10 mg total) by mouth 2 (two) times daily. 06/02/19   Johnn Hai, MD  simvastatin (ZOCOR) 20 MG tablet Take 20 mg by mouth at bedtime. 08/21/19   [provider]    Allergies    Wellbutrin [bupropion], Omnipaque [iohexol], Penicillins, Cogentin [benztropine], Depakote er [divalproex sodium er], Divalproex sodium, and Iohexol  Review of Systems   Review of Systems  Neurological: Positive for headaches. Negative for weakness.  All other systems reviewed and are negative.   Physical Exam Updated Vital Signs BP (!) 140/91 (BP Location: Right Arm)   Pulse 82   Temp 99 F (37.2 C) (Oral)   Resp (!) 24   SpO2 99%   Physical Exam Vitals and nursing note reviewed.  Constitutional:      Appearance: She is well-developed.  HENT:     Head:  Normocephalic.  Eyes:     Extraocular Movements: Extraocular movements intact.     Pupils: Pupils are equal, round, and reactive to light.  Cardiovascular:     Rate and Rhythm: Normal rate and regular rhythm.  Pulmonary:     Effort: Pulmonary effort is normal.  Abdominal:     General: There is no distension.  Musculoskeletal:        General: Normal range of motion.     Cervical back: Normal range of motion.  Skin:    General: Skin is warm.  Neurological:     Mental Status: She is alert and oriented to person, place, and time.  Psychiatric:        Mood  and Affect: Mood normal.     ED Results / Procedures / Treatments   Labs (all labs ordered are listed, but only abnormal results are displayed) Labs Reviewed  CBG MONITORING, ED - Abnormal; Notable for the following components:      Result Value   Glucose-Capillary 127 (*)    All other components within normal limits    EKG None  Radiology No results found.  Procedures Procedures (including critical care time)  Medications Ordered in ED Medications  acetaminophen (TYLENOL) tablet 650 mg (650 mg Oral Given 11/03/19 1238)    ED Course  I have reviewed the triage vital signs and the nursing notes.  Pertinent labs & imaging results that were available during my care of the patient were reviewed by me and considered in my medical decision making (see chart for details).    MDM Rules/Calculators/A&P                          MDM:  Cbg 127.  Blood pressure is 140/91.  Pt's headache does not sound pathologic.  Pt advised to take tylenol increase water.  Return if any problems.  Final Clinical Impression(s) / ED Diagnoses Final diagnoses:  Acute non intractable tension-type headache    Rx / DC Orders ED Discharge Orders    None    An After Visit Summary was printed and given to the patient.    Sidney Ace 11/03/19 1311    [name], MD 11/03/19 1539

## 2019-11-03 NOTE — ED Triage Notes (Signed)
Per [facility], complaining of a headache since last night, OTC meds not working-

## 2019-11-03 NOTE — Discharge Instructions (Addendum)
Return if any problems.

## 2019-11-12 ENCOUNTER — Ambulatory Visit (HOSPITAL_COMMUNITY)
Admission: EM | Admit: 2019-11-12 | Discharge: 2019-11-12 | Disposition: A | Discharge status: Home / Self Care | Payer: Medicaid Other | Attending: Psychiatry | Admitting: Psychiatry

## 2019-11-12 ENCOUNTER — Other Ambulatory Visit: Payer: Self-pay

## 2019-11-12 DIAGNOSIS — Z1330 Encounter for screening examination for mental health and behavioral disorders, unspecified: Secondary | ICD-10-CM | POA: Diagnosis not present

## 2019-11-12 DIAGNOSIS — F3132 Bipolar disorder, current episode depressed, moderate: Secondary | ICD-10-CM | POA: Insufficient documentation

## 2019-11-12 DIAGNOSIS — Z592 Discord with neighbors, lodgers and landlord: Secondary | ICD-10-CM | POA: Insufficient documentation
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
## 2019-11-12 NOTE — ED Notes (Signed)
TTS notified about pt. BP

## 2019-11-12 NOTE — ED Provider Notes (Signed)
Behavioral Health Medical Screening Exam  [name] is a 29 y.o. female who presents voluntarily to [facility] with law enforcement. She is well known to behavioral health services. She is requesting assistance with housing. She does not like her current group home. She is alert and oriented x 4. Speech is clear and coherent, increased volume. She denies auditory and visual hallucinations. States that her current medications are working well. She initially reported that if she was going to overdose on her medications if not placed in different housing. Patient later stated that she was not suicidal and that she was ready to go home. She denies homicidal ideations.   Total Time spent with patient: 30 minutes  Psychiatric Specialty Exam  Presentation  General Appearance:Appropriate for Environment;Casual;Well Groomed  Eye Contact:Good  Speech:Clear and Coherent;Normal Rate  Speech Volume:Increased  Handedness:Right   Mood and Affect  Mood:Dysphoric  Affect:Flat   Thought Process  Thought Processes:Coherent;Linear  Descriptions of Associations:Loose  Orientation:Full (Time, Place and Person)  Thought Content:Rumination  Hallucinations:None  Ideas of Reference:None  Suicidal Thoughts:Yes, Active With Plan;With Means to Carry Out  Homicidal Thoughts:No   Sensorium  Memory:Immediate Fair;Recent Fair;Remote Fair  Judgment:Fair  Insight:Fair   Executive Functions  Concentration:Poor  Attention Span:Poor  [place]  Language:Good   Psychomotor Activity  Psychomotor Activity:Restlessness   Assets  Assets:Housing;Physical Health;Resilience;Social Support   Sleep  Sleep:Fair  Number of hours: No data recorded  Physical Exam: Physical Exam Vitals and nursing note reviewed.  Constitutional:      General: She is not in acute distress.    Appearance: She is well-developed. She is not ill-appearing, toxic-appearing or  diaphoretic.  HENT:     Head: Normocephalic and atraumatic.     Right Ear: External ear normal.     Left Ear: External ear normal.  Eyes:     Pupils: Pupils are equal, round, and reactive to light.  Cardiovascular:     Rate and Rhythm: Normal rate.     Heart sounds: No murmur heard.   Pulmonary:     Effort: Pulmonary effort is normal. No respiratory distress.  Abdominal:     Tenderness: There is no abdominal tenderness.  Skin:    General: Skin is warm and dry.  Neurological:     Mental Status: She is alert and oriented to person, place, and time.  Psychiatric:        Mood and Affect: Mood is anxious.        Behavior: Behavior is cooperative.        Thought Content: Thought content is not paranoid or delusional. Thought content does not include homicidal or suicidal ideation.    Review of Systems  Constitutional: Negative for chills, diaphoresis, fever, malaise/fatigue and weight loss.  Respiratory: Negative for cough and shortness of breath.   Cardiovascular: Negative for chest pain.  Gastrointestinal: Negative for diarrhea, nausea and vomiting.  Psychiatric/Behavioral: Positive for hallucinations and suicidal ideas. The patient is nervous/anxious and has insomnia.    Blood pressure (!) 135/95, pulse 89, temperature 97.8 F (36.6 C), temperature source Tympanic, resp. rate 20, height 5' 5.75" (1.67 m), weight 267 lb (121.1 kg), SpO2 99 %. Body mass index is 43.43 kg/m.  Musculoskeletal: Strength & Muscle Tone: within normal limits Gait & Station: normal Patient leans: N/A   Recommendations:  Based on my evaluation the patient does not appear to have an emergency medical condition.  Rozetta Nunnery, NP 11/12/2019, 9:11 PM

## 2019-11-16 ENCOUNTER — Ambulatory Visit (HOSPITAL_COMMUNITY)
Admission: EM | Admit: 2019-11-16 | Discharge: 2019-11-16 | Disposition: A | Discharge status: Home / Self Care | Payer: Medicaid Other | Attending: Psychiatry | Admitting: Psychiatry

## 2019-11-16 ENCOUNTER — Other Ambulatory Visit: Payer: Self-pay

## 2019-11-16 DIAGNOSIS — E669 Obesity, unspecified: Secondary | ICD-10-CM | POA: Insufficient documentation

## 2019-11-16 DIAGNOSIS — I10 Essential (primary) hypertension: Secondary | ICD-10-CM | POA: Insufficient documentation

## 2019-11-16 DIAGNOSIS — Z794 Long term (current) use of insulin: Secondary | ICD-10-CM | POA: Insufficient documentation

## 2019-11-16 DIAGNOSIS — Z79899 Other long term (current) drug therapy: Secondary | ICD-10-CM | POA: Insufficient documentation

## 2019-11-16 DIAGNOSIS — E119 Type 2 diabetes mellitus without complications: Secondary | ICD-10-CM | POA: Insufficient documentation

## 2019-11-16 DIAGNOSIS — F319 Bipolar disorder, unspecified: Secondary | ICD-10-CM | POA: Insufficient documentation

## 2019-11-16 DIAGNOSIS — F419 Anxiety disorder, unspecified: Secondary | ICD-10-CM | POA: Insufficient documentation

## 2019-11-16 DIAGNOSIS — F17210 Nicotine dependence, cigarettes, uncomplicated: Secondary | ICD-10-CM | POA: Insufficient documentation

## 2019-11-16 NOTE — ED Provider Notes (Signed)
[facility] ED Discharge  11/16/2019 3:40 PM [name]  MRN:  [id number] Principal Problem: <principal problem not specified> Discharge Diagnoses: Active Problems:   * No active hospital problems. *   Subjective: I got upset as ball at the boardinghouse is always telling me what he did, I need my own place  Patient is a 29 year old female well-known to the service, was brought by police as she got upset at her boardinghouse, felt overwhelmed, was loud and agitated.  On initial presentation here, patient stated that she was a very upset, felt her act team was not doing anything to help her, wants her own place to stay as a resident at the boardinghouse is always dictating and is rude to her and condescending.  Patient was able to calm down, was able to talk about what upsets her, discussed coping mechanisms, contacted patient's act team to see if they could transport her back to the boardinghouse.  The act team crisis line worker stated that he would keep a message to his supervisor, did not get a call back.  Patient reports that she is not suicidal, homicidal or psychotic.  She denies any thoughts of hurting herself or others.  She denies any self mutilating behaviors and adds that she is taking her medications as prescribed Total Time spent with patient: 45 minutes Allergies as of 11/16/2019      Reactions   Wellbutrin [bupropion] Shortness Of Breath   Omnipaque [iohexol] Swelling, Other (See Comments)   Reaction:  Eye swelling   Penicillins Hives, Other (See Comments)   Has patient had a PCN reaction causing immediate rash, facial/tongue/throat swelling, SOB or lightheadedness with hypotension: Unknown Has patient had a PCN reaction causing severe rash involving mucus membranes or skin necrosis: Yes Has patient had a PCN reaction that required hospitalization Unknown Has patient had a PCN reaction occurring within the last 10 years: No If all of the above answers are "NO", then may  proceed with Cephalosporin use.   Cogentin [benztropine]    Make pt feel crazy   Depakote Er [divalproex Sodium Er] Nausea And Vomiting   Divalproex Sodium    Iohexol           Past Psychiatric History: Long history of psychiatric illness, with multiple placements, hospitalizations, currently works with [employer] act team [employer] Past Medical History:  Past Medical History:  Diagnosis Date  . Anxiety   . Bipolar 1 disorder ([place])   . Cognitive deficits   . Depression   . Diabetes mellitus without complication ([place])   . Hypertension   . Mental disorder   . Mental health disorder   . Obesity     Past Surgical History:  Procedure Laterality Date  . CESAREAN SECTION    . CESAREAN SECTION N/A 04/25/2013   Procedure: REPEAT CESAREAN SECTION;  Surgeon: [name], MD;  Location: [facility];  Service: Obstetrics;  Laterality: N/A;  . MASS EXCISION N/A 06/03/2012   Procedure: EXCISION MASS;  Surgeon: [name], MD;  Location: [facility];  Service: ENT;  Laterality: N/A;  Excision uvula mass  . TONSILLECTOMY N/A 06/03/2012   Procedure: TONSILLECTOMY;  Surgeon: [name], MD;  Location: [facility];  Service: ENT;  Laterality: N/A;  . TONSILLECTOMY     Family History:  Family History  Problem Relation Age of Onset  . Hypertension Mother   . Diabetes Father    Family Psychiatric  History: Unchanged from previous assessments Social History:  Social History  Substance and Sexual Activity  Alcohol Use Not Currently   Comment: occ: last intake 4 mts ago     Social History   Substance and Sexual Activity  Drug Use Not Currently  . Types: "Crack" cocaine, Other-see comments   Comment: Patient reports hx of smoking Crack    Social History   Socioeconomic History  . Marital status: Single    Spouse name: Not on file  . Number of children: Not on file  . Years of education: Not on file  . Highest education level: Not on file  Occupational  History  . Not on file  Tobacco Use  . Smoking status: Current Every Day Smoker    Packs/day: 4.00    Years: 11.00    Pack years: 44.00    Types: Cigarettes  . Smokeless tobacco: Never Used  . Tobacco comment: Pt declined  Vaping Use  . Vaping Use: Never used  Substance and Sexual Activity  . Alcohol use: Not Currently    Comment: occ: last intake 4 mts ago  . Drug use: Not Currently    Types: "Crack" cocaine, Other-see comments    Comment: Patient reports hx of smoking Crack  . Sexual activity: Yes    Birth control/protection: Implant  Other Topics Concern  . Not on file  Social History Narrative          Social Determinants of Health   Financial Resource Strain:   . Difficulty of Paying Living Expenses:   Food Insecurity:   . Worried About Charity fundraiser in the Last Year:   . Arboriculturist in the Last Year:   Transportation Needs:   . Film/video editor (Medical):   Marland Kitchen Lack of Transportation (Non-Medical):   Physical Activity:   . Days of Exercise per Week:   . Minutes of Exercise per Session:   Stress:   . Feeling of Stress :   Social Connections:   . Frequency of Communication with Friends and Family:   . Frequency of Social Gatherings with Friends and Family:   . Attends Religious Services:   . Active Member of Clubs or Organizations:   . Attends Archivist Meetings:   Marland Kitchen Marital Status:     Has this patient used any form of tobacco in the last 30 days? (Cigarettes, Smokeless Tobacco, Cigars, and/or Pipes) Prescription not provided because: Patient refuses  Current Medications: No current facility-administered medications for this encounter.   Current Outpatient Medications  Medication Sig Dispense Refill  . ARIPiprazole ER (ABILIFY MAINTENA) 400 MG SRER injection Inject 2 mLs (400 mg total) into the muscle every 28 (twenty-eight) days. 1 each 11  . BANOPHEN 25 MG capsule Take 25 mg by mouth at bedtime.    . CHANTIX 0.5 MG tablet Take  0.5 mg by mouth 2 (two) times daily.    . cholecalciferol (VITAMIN D) 25 MCG (1000 UNIT) tablet Take 1,000 Units by mouth daily.    Marland Kitchen gabapentin (NEURONTIN) 300 MG capsule Take 300 mg by mouth 3 (three) times daily as needed.     Marland Kitchen glipiZIDE (GLUCOTROL XL) 10 MG 24 hr tablet Take 10 mg by mouth 2 (two) times daily before lunch and supper. (Patient not taking: Reported on 10/16/2019)    . hydrochlorothiazide (HYDRODIURIL) 25 MG tablet Take 25 mg by mouth daily.    Marland Kitchen lamoTRIgine (LAMICTAL) 100 MG tablet Take 100 mg by mouth daily.    Marland Kitchen LEVEMIR FLEXTOUCH 100 UNIT/ML FlexPen Inject 40  Units into the skin 2 (two) times daily.     Marland Kitchen lithium carbonate (ESKALITH) 450 MG CR tablet Take 2 tablets (900 mg total) by mouth at bedtime. 60 tablet 2  . omeprazole (PRILOSEC) 20 MG capsule Take 20 mg by mouth daily.    Marland Kitchen perphenazine (TRILAFON) 2 MG tablet Take 6 mg by mouth 2 (two) times daily.    . propranolol (INDERAL) 10 MG tablet Take 1 tablet (10 mg total) by mouth 2 (two) times daily. 60 tablet 2  . simvastatin (ZOCOR) 20 MG tablet Take 20 mg by mouth at bedtime.    . vitamin B-12 (CYANOCOBALAMIN) 1000 MCG tablet Take 1,000 mcg by mouth daily.     PTA Medications: (Not in a hospital admission)   Musculoskeletal: Strength & Muscle Tone: within normal limits Gait & Station: normal Patient leans: N/A  Psychiatric Specialty Exam: Physical Exam Constitutional:      Appearance: Normal appearance.  HENT:     Head: Normocephalic and atraumatic.     Nose: Nose normal.  Eyes:     Extraocular Movements: Extraocular movements intact.     Pupils: Pupils are equal, round, and reactive to light.  Cardiovascular:     Rate and Rhythm: Normal rate.  Pulmonary:     Effort: Pulmonary effort is normal.     Breath sounds: Normal breath sounds.  Abdominal:     Palpations: Abdomen is soft.  Musculoskeletal:        General: Normal range of motion.     Cervical back: Normal range of motion.  Skin:    General:  Skin is warm and dry.  Neurological:     General: No focal deficit present.     Mental Status: She is alert and oriented to person, place, and time.     Review of Systems  Blood pressure (!) 136/89, pulse 100, resp. rate 20, SpO2 100 %.There is no height or weight on file to calculate BMI.  General Appearance: Casual  Eye Contact:  Good  Speech:  Clear and Coherent and Normal Rate  Volume:  Normal  Mood:  Euthymic  Affect:  Congruent and Full Range  Thought Process:  Coherent, Goal Directed and Descriptions of Associations: Intact  Orientation:  Full (Time, Place, and Person)  Thought Content:  WDL  Suicidal Thoughts:  No  Homicidal Thoughts:  No  Memory:  Immediate;   Fair Recent;   Fair Remote;   Fair  Judgement:  Impaired  Insight:  Shallow  Psychomotor Activity:  Mannerisms  Concentration:  Concentration: Fair and Attention Span: Fair  Recall:  [employer] of Knowledge:  Fair  Language:  Fair  Akathisia:  No  Handed:  Right  AIMS (if indicated):     Assets:  Desire for Improvement Social Support  ADL's:  Intact  Cognition:  WNL  Sleep:        Demographic Factors:  Low socioeconomic status  Loss Factors: Decrease in vocational status  Historical Factors: Impulsivity  Risk Reduction Factors:   Positive social support  Continued Clinical Symptoms:  More than one psychiatric diagnosis Unstable or Poor Therapeutic Relationship  Cognitive Features That Contribute To Risk:  Closed-mindedness    Suicide Risk:  Minimal: No identifiable suicidal ideation.  Patients presenting with no risk factors but with morbid ruminations; may be classified as minimal risk based on the severity of the depressive symptoms    Plan Of Care/Follow-up recommendations:  Activity:  As tolerated Diet:  Regular heart healthy diet Other:  To return back to the boarding house and to continue to work with her act team  Disposition: Patient to return back to her boardinghouse,  patient's act team was contacted to manage patient in the community.  Patient is willing to work on her coping skills, crisis and safety planning done in length prior to recommending discharge. Patient is medically and psychiatrically stable 11/16/2019, 3:40 PM

## 2019-12-25 MED FILL — Hydroxyzine HCl Tab 25 MG: ORAL | Qty: 25 | Status: AC

## 2019-12-25 MED FILL — Olanzapine Orally Disintegrating Tab 10 MG: ORAL | Qty: 10 | Status: AC

## 2020-01-02 IMAGING — CR DG CHEST 2V
2 series · 2 of 2 positions shown · non-contrast
Comparison: 07/02/2017

CLINICAL DATA: Sore throat and cough for 1 month

EXAM:
CHEST - 2 VIEW

[chest pa]
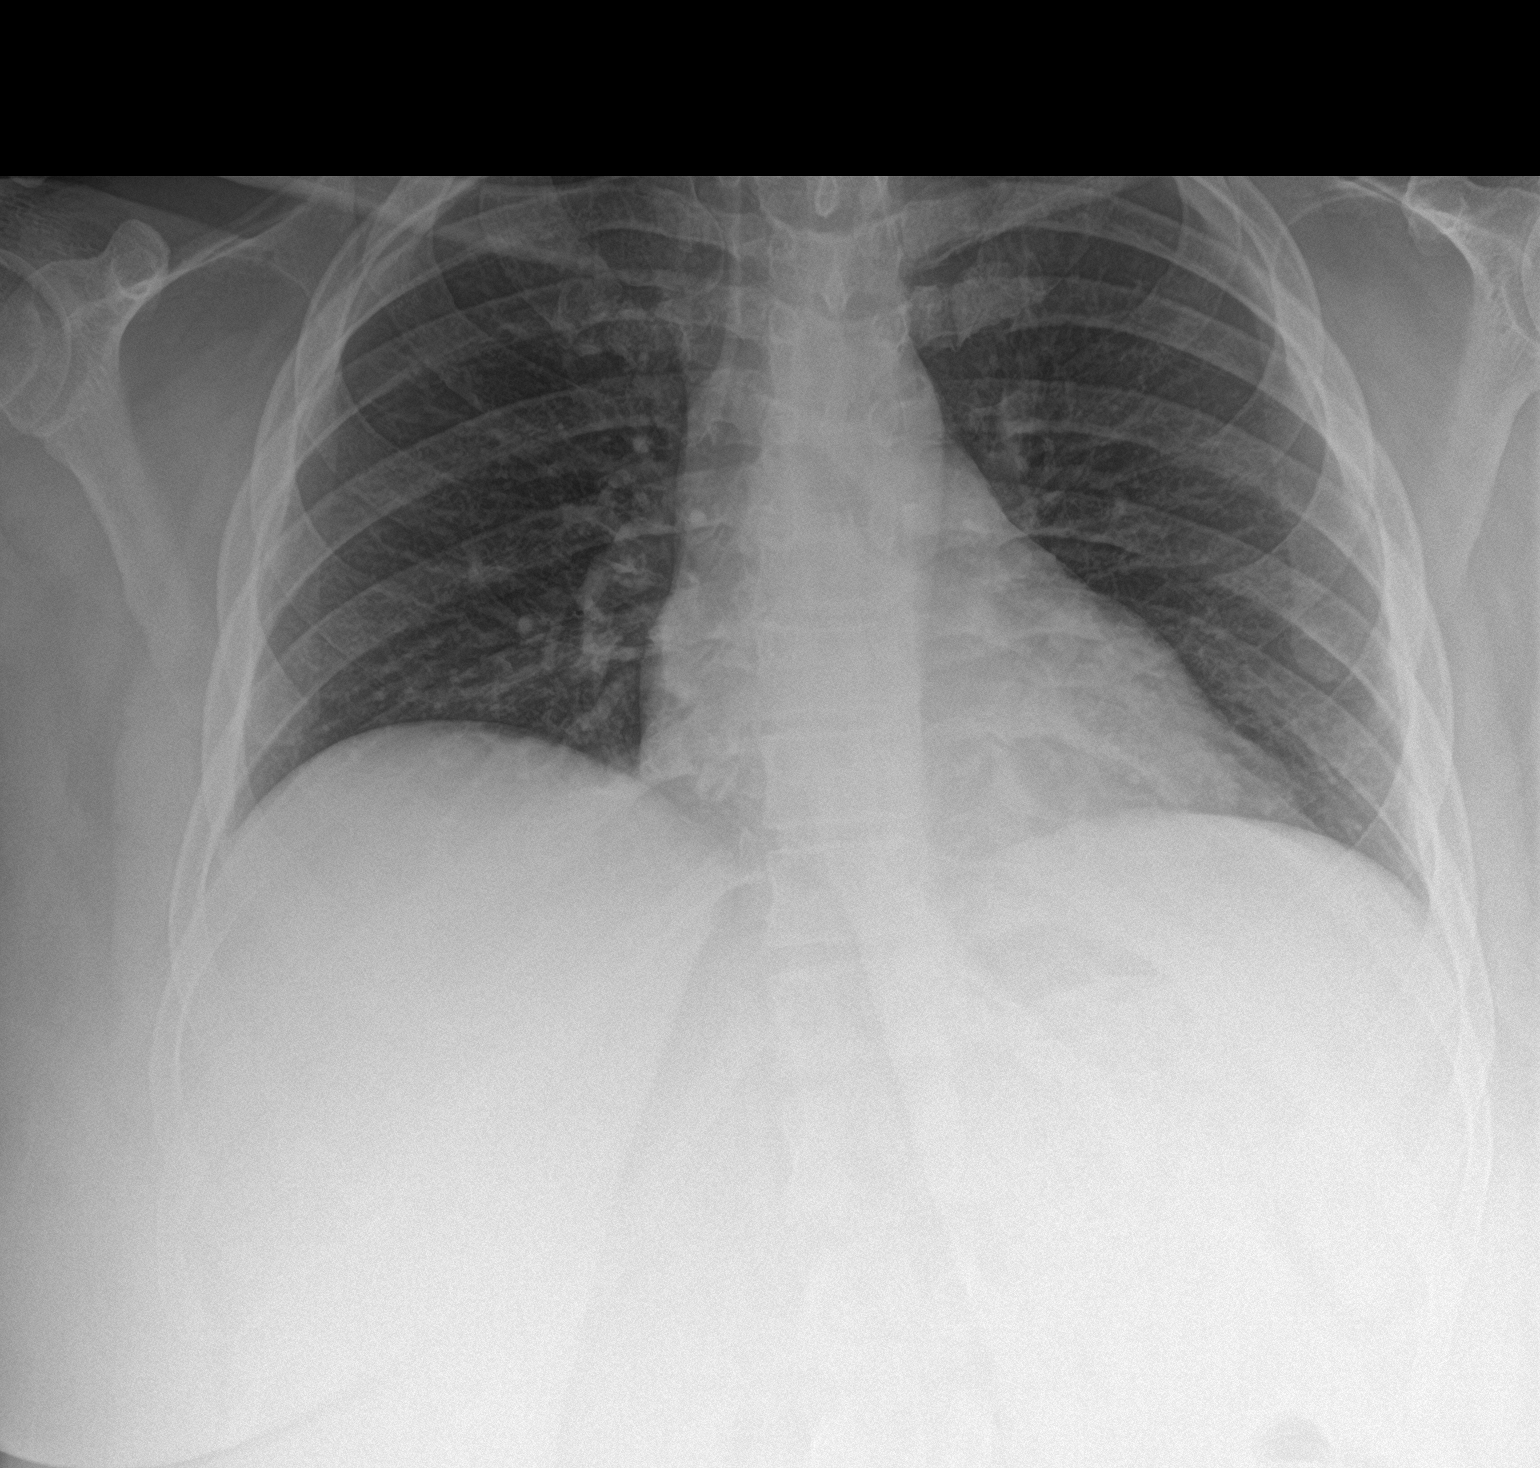

[chest lat]
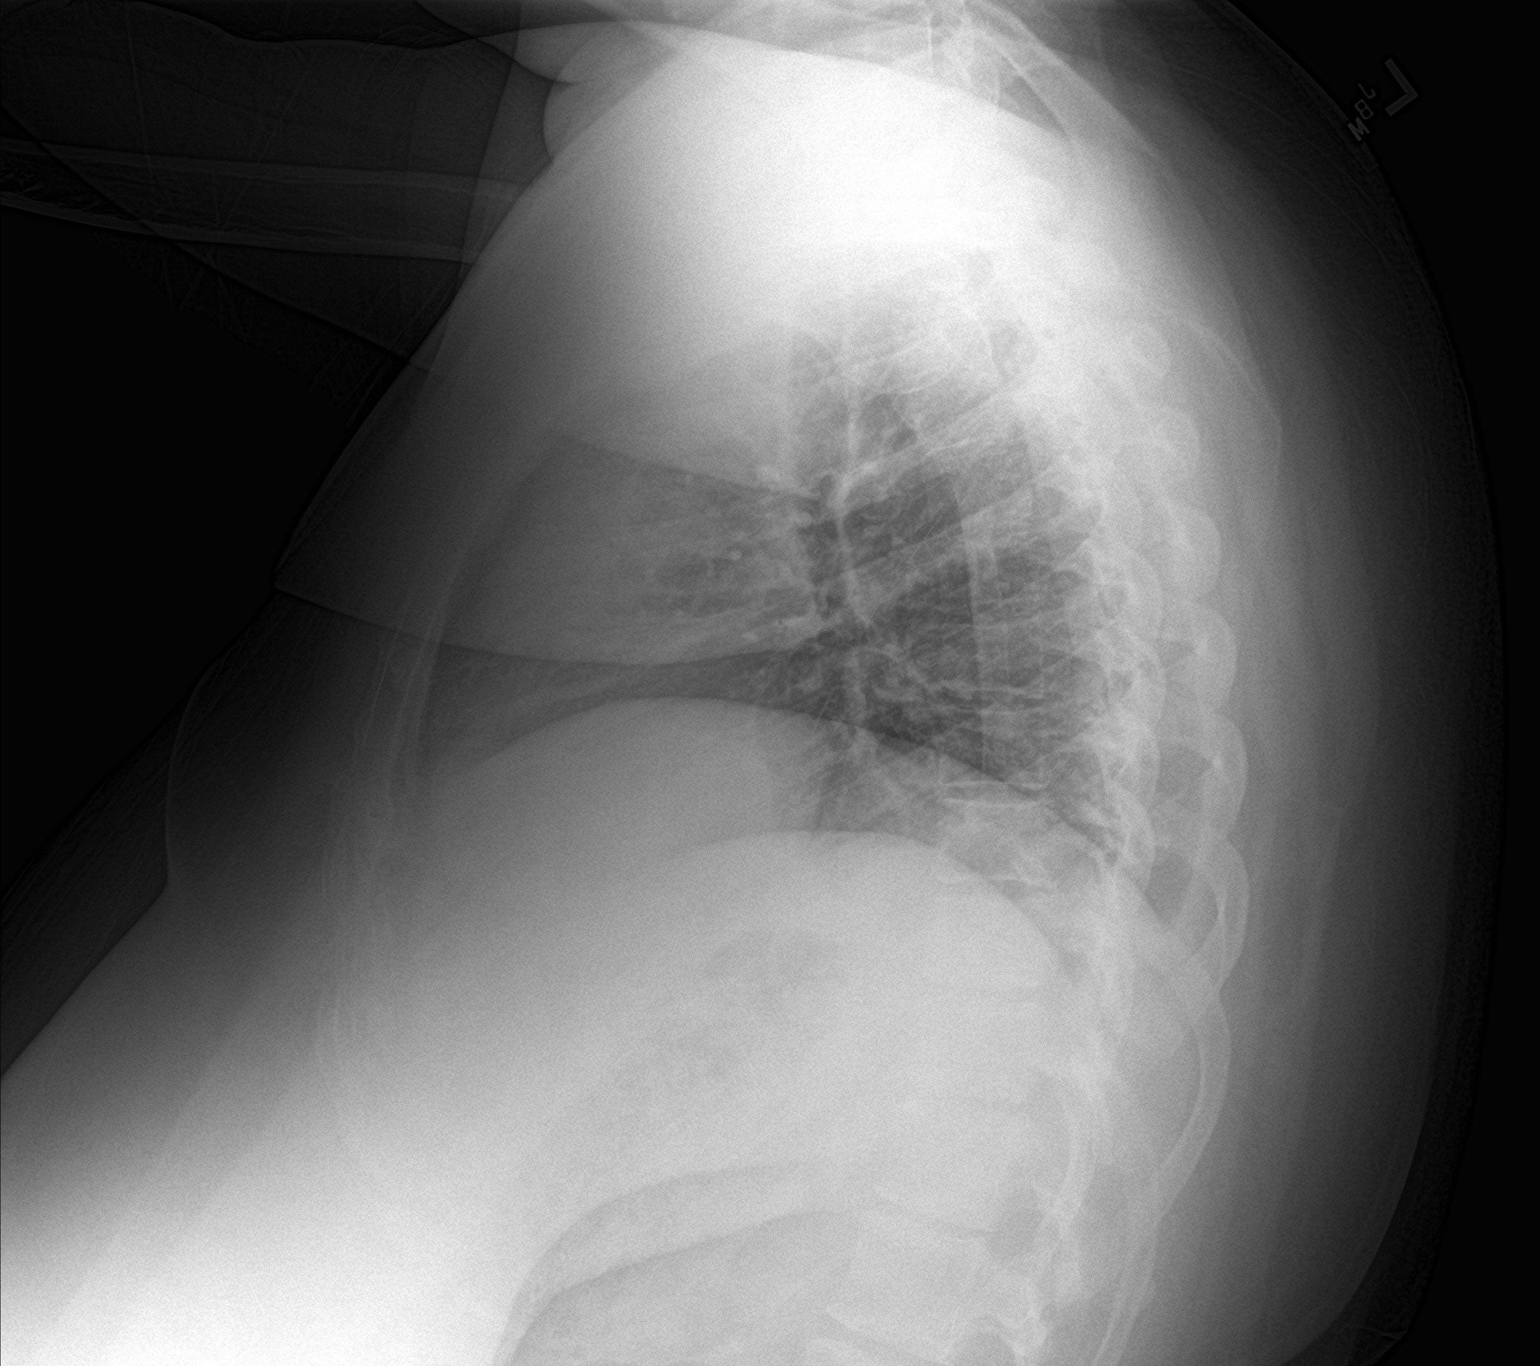

[2 of 2 positions shown; findings below may reference images not displayed]

FINDINGS: The heart size and mediastinal contours are within normal limits.
Both lungs are clear. The visualized skeletal structures are
unremarkable.
IMPRESSION: No active cardiopulmonary disease.

## 2020-01-22 ENCOUNTER — Other Ambulatory Visit: Payer: Self-pay

## 2020-01-22 ENCOUNTER — Encounter (HOSPITAL_COMMUNITY): Payer: Self-pay

## 2020-01-22 ENCOUNTER — Emergency Department (HOSPITAL_COMMUNITY)
Admission: EM | Admit: 2020-01-22 | Discharge: 2020-01-23 | Disposition: A | Discharge status: Home / Self Care | Payer: Medicaid Other | Attending: Emergency Medicine | Admitting: Emergency Medicine

## 2020-01-22 DIAGNOSIS — Z5321 Procedure and treatment not carried out due to patient leaving prior to being seen by health care provider: Secondary | ICD-10-CM | POA: Diagnosis not present

## 2020-01-22 DIAGNOSIS — G4710 Hypersomnia, unspecified: Secondary | ICD-10-CM | POA: Insufficient documentation

## 2020-01-22 NOTE — ED Triage Notes (Signed)
Pt arrives EMS for being more sleepy than normal. Pt reports taking 2 naps today. Reports taking 25mg  benadryl between the naps.

## 2020-02-05 ENCOUNTER — Emergency Department (HOSPITAL_COMMUNITY)
Admission: EM | Admit: 2020-02-05 | Discharge: 2020-02-05 | Disposition: A | Discharge status: Home / Self Care | Payer: Medicaid Other | Attending: Emergency Medicine | Admitting: Emergency Medicine

## 2020-02-05 ENCOUNTER — Encounter (HOSPITAL_COMMUNITY): Payer: Self-pay

## 2020-02-05 DIAGNOSIS — F17210 Nicotine dependence, cigarettes, uncomplicated: Secondary | ICD-10-CM | POA: Insufficient documentation

## 2020-02-05 DIAGNOSIS — Z7984 Long term (current) use of oral hypoglycemic drugs: Secondary | ICD-10-CM | POA: Diagnosis not present

## 2020-02-05 DIAGNOSIS — Z79899 Other long term (current) drug therapy: Secondary | ICD-10-CM | POA: Diagnosis not present

## 2020-02-05 DIAGNOSIS — B373 Candidiasis of vulva and vagina: Secondary | ICD-10-CM | POA: Insufficient documentation

## 2020-02-05 DIAGNOSIS — Z794 Long term (current) use of insulin: Secondary | ICD-10-CM | POA: Diagnosis not present

## 2020-02-05 DIAGNOSIS — I10 Essential (primary) hypertension: Secondary | ICD-10-CM | POA: Insufficient documentation

## 2020-02-05 DIAGNOSIS — B3731 Acute candidiasis of vulva and vagina: Secondary | ICD-10-CM

## 2020-02-05 DIAGNOSIS — E119 Type 2 diabetes mellitus without complications: Secondary | ICD-10-CM | POA: Insufficient documentation

## 2020-02-05 DIAGNOSIS — N898 Other specified noninflammatory disorders of vagina: Secondary | ICD-10-CM | POA: Diagnosis present

## 2020-02-05 LAB — URINALYSIS, ROUTINE W REFLEX MICROSCOPIC
Bilirubin Urine: NEGATIVE
Glucose, UA: NEGATIVE mg/dL
Hgb urine dipstick: NEGATIVE
Ketones, ur: 20 mg/dL — AB
Nitrite: NEGATIVE
Protein, ur: 30 mg/dL — AB
Specific Gravity, Urine: 1.028 (ref 1.005–1.030)
pH: 6 (ref 5.0–8.0)

## 2020-02-05 LAB — WET PREP, GENITAL
Clue Cells Wet Prep HPF POC: NONE SEEN
Sperm: NONE SEEN
Trich, Wet Prep: NONE SEEN
Yeast Wet Prep HPF POC: NONE SEEN

## 2020-02-05 LAB — I-STAT BETA HCG BLOOD, ED (MC, WL, AP ONLY): I-stat hCG, quantitative: <5 m[IU]/mL (ref <5)

## 2020-02-05 MED ORDER — FLUCONAZOLE 200 MG PO TABS: 200.0000 mg | ORAL_TABLET | ORAL | 0 refills | Status: AC

## 2020-02-05 MED ORDER — FLUCONAZOLE 200 MG PO TABS
200.0000 mg | ORAL_TABLET | Freq: Once | ORAL | Status: AC
  Administered 2020-02-05: 200 mg via ORAL
  Filled 2020-02-05: qty 1

## 2020-02-05 NOTE — ED Notes (Signed)
Patient has 3 gold tops in main lab

## 2020-02-05 NOTE — ED Notes (Signed)
Pt advised that urine sample is needed.  Pt shown to bathroom and given a hat to void in.

## 2020-02-05 NOTE — ED Triage Notes (Signed)
Pt BIB EMS. Pt c/o vaginal discharge, itching, and burning x6 days. VSS. Hx of HTN.

## 2020-02-05 NOTE — Discharge Instructions (Signed)
Please drink plenty of water. Use the Diflucan and will be but I have written a prescription for. Please follow-up with primary care doctor.

## 2020-02-05 NOTE — ED Provider Notes (Signed)
[facility] DEPT Provider Note   CSN: [id number] Arrival date & time: 02/05/20  1213     History Chief Complaint  Patient presents with   Vaginal Discharge    [name] is a 29 y.o. female.  HPI  Patient is a 29 year old female with past medical history of cognitive deficits, DM, HTN, obesity, bipolar on lithium and other psychiatric medication.  She is presenting today for vaginal itching and burning.  She states it has been ongoing for 6 days.  She has not been on any recent antibiotics.  She states that she has been tested for HIV in the past has been negative.  She states she is not currently sexually active.  She states she has a history of high blood pressure.  She has not recently seen her primary care doctor for this.  She denies any chest pain, shortness of breath, lightheadedness, headache, dizziness, or any other new or concerning symptoms.  She denies any abdominal pain, frequency, urgency or dysuria.  She does feel it is little overall in her vagina when she does urinate but denies any pain unless the urine stream hits her vagina.    Past Medical History:  Diagnosis Date   Anxiety    Bipolar 1 disorder ([place])    Cognitive deficits    Depression    Diabetes mellitus without complication ([place])    Hypertension    Mental disorder    Mental health disorder    Obesity     Patient Active Problem List   Diagnosis Date Noted   Schizophrenia ([place]) 09/13/2019   Bipolar 1 disorder ([place]) 06/23/2019   MDD (major depressive disorder) 10/10/2018   Schizoaffective disorder, bipolar type ([place]) 09/25/2018   Bipolar I disorder ([place]) 06/13/2018   HTN (hypertension) 05/03/2018   Tobacco use disorder 05/03/2018   Bipolar I disorder, most recent episode depressed, severe without psychotic features ([place]) 05/02/2018   Adjustment disorder with emotional disturbance 01/02/2018   Schizophrenia, 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    Moderate bipolar I disorder, most recent episode depressed ([place])    Psychosis ([place])    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/2017   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   OCD (obsessive compulsive disorder) 10/05/2016   Major depressive disorder, recurrent episode, mild ([place]) 05/04/2016   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   Learning disability 07/18/2015   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/2015   Diabetes mellitus ([place]) 07/18/2015   MDD (major depressive disorder), recurrent, severe, 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   Hyperlipidemia 07/18/2015   Severe episode of recurrent major depressive disorder, without psychotic features ([place])    Suicidal ideation    Drug overdose    Cognitive deficits 10/12/2012   Generalized anxiety disorder 06/28/2012    Past Surgical History:  Procedure Laterality Date   CESAREAN SECTION     CESAREAN SECTION N/A 04/25/2013   Procedure: REPEAT CESAREAN SECTION;  Surgeon: [name], MD;  Location: [facility];  Service: Obstetrics;  Laterality: N/A;   MASS EXCISION N/A 06/03/2012   Procedure: EXCISION MASS;  Surgeon: [name], MD;  Location: [facility];  Service: ENT;  Laterality: N/A;  Excision uvula mass   TONSILLECTOMY N/A 06/03/2012   Procedure: TONSILLECTOMY;  Surgeon: [name], MD;  Location: [facility];  Service: ENT;  Laterality: N/A;   TONSILLECTOMY       OB History    Gravida  3   Para  3   Term  3   Preterm  0   AB  0   Living  3     SAB  0   TAB  0   Ectopic  0   Multiple      Live Births  3           Family History  Problem Relation Age of Onset   Hypertension Mother    Diabetes Father     Social History   Tobacco Use   Smoking status: Current Every Day Smoker    Packs/day: 4.00    Years: 11.00    Pack years: 44.00    Types: Cigarettes   Smokeless tobacco: Never Used   Tobacco comment: Pt declined  Vaping  Use   Vaping Use: Never used  Substance Use Topics   Alcohol use: Not Currently    Comment: occ: last intake 4 mts ago   Drug use: Not Currently    Types: "Crack" cocaine, Other-see comments    Comment: Patient reports hx of smoking Crack    Home Medications Prior to Admission medications   Medication Sig Start Date End Date Taking? Authorizing Provider  ARIPiprazole ER (ABILIFY MAINTENA) 400 MG SRER injection Inject 2 mLs (400 mg total) into the muscle every 28 (twenty-eight) days. 06/28/19   Johnn Hai, MD  BANOPHEN 25 MG capsule Take 25 mg by mouth at bedtime. 10/19/19   [provider]  CHANTIX 0.5 MG tablet Take 0.5 mg by mouth 2 (two) times daily. 10/13/19   [provider]  cholecalciferol (VITAMIN D) 25 MCG (1000 UNIT) tablet Take 1,000 Units by mouth daily. 08/21/19   [provider]  fluconazole (DIFLUCAN) 200 MG tablet Take 1 tablet (200 mg total) by mouth once a week for 1 dose. 02/05/20 02/06/20  Tedd Sias, PA  gabapentin (NEURONTIN) 300 MG capsule Take 300 mg by mouth 3 (three) times daily as needed.  08/21/19   [provider]  glipiZIDE (GLUCOTROL XL) 10 MG 24 hr tablet Take 10 mg by mouth 2 (two) times daily before lunch and supper. Patient not taking: Reported on 10/16/2019    [provider]  hydrochlorothiazide (HYDRODIURIL) 25 MG tablet Take 25 mg by mouth daily. 08/21/19   [provider]  lamoTRIgine (LAMICTAL) 100 MG tablet Take 100 mg by mouth daily. 08/21/19   [provider]  LEVEMIR FLEXTOUCH 100 UNIT/ML FlexPen Inject 40 Units into the skin 2 (two) times daily.  08/09/19   [provider]  lithium carbonate (ESKALITH) 450 MG CR tablet Take 2 tablets (900 mg total) by mouth at bedtime. 06/02/19   Johnn Hai, MD  omeprazole (PRILOSEC) 20 MG capsule Take 20 mg by mouth daily. 11/13/19   [provider]  perphenazine (TRILAFON) 2 MG tablet Take 6 mg by mouth 2 (two) times daily.     [provider]  propranolol (INDERAL) 10 MG tablet Take 1 tablet (10 mg total) by mouth 2 (two) times daily. 06/02/19   Johnn Hai, MD  simvastatin (ZOCOR) 20 MG tablet Take 20 mg by mouth at bedtime. 08/21/19   [provider]  vitamin B-12 (CYANOCOBALAMIN) 1000 MCG tablet Take 1,000 mcg by mouth daily. 11/13/19   [provider]    Allergies    Wellbutrin [bupropion], Omnipaque [iohexol], Penicillins, Cogentin [benztropine], Depakote er [divalproex sodium er], Divalproex sodium, and Iohexol  Review of Systems   Review of Systems  Constitutional: Negative for chills and fever.  HENT: Negative for congestion.   Eyes: Negative for pain.  Respiratory: Negative for cough and shortness of breath.   Cardiovascular: Negative for chest pain and leg swelling.  Gastrointestinal: Negative for abdominal pain, diarrhea, nausea and vomiting.  Genitourinary: Positive for vaginal discharge and vaginal pain. Negative for dysuria and vaginal bleeding.       Vaginal itching, discharge and discomfort  Musculoskeletal: Negative for myalgias.  Skin: Negative for rash.  Neurological: Negative for dizziness and headaches.    Physical Exam Updated Vital Signs BP (!) 169/117 (BP Location: Right Arm)    Pulse 97    Temp 98.9 F (37.2 C) (Oral)    Resp 18    SpO2 100%   Physical Exam Vitals and nursing note reviewed.  Constitutional:      General: She is not in acute distress.    Appearance: Normal appearance. She is not ill-appearing.     Comments: Morbidly obese 29 year old female, pleasant, able answer questions appropriate follow commands.  HENT:     Head: Normocephalic and atraumatic.     Nose: Nose normal.  Eyes:     General: No scleral icterus.       Right eye: No discharge.        Left eye: No discharge.     Conjunctiva/sclera: Conjunctivae normal.  Cardiovascular:     Rate and Rhythm: Normal rate and regular rhythm.     Pulses: Normal pulses.     Heart sounds:  Normal heart sounds.  Pulmonary:     Effort: Pulmonary effort is normal. No respiratory distress.     Breath sounds: No stridor. No wheezing.  Abdominal:     Palpations: Abdomen is soft.     Tenderness: There is no abdominal tenderness. There is no right CVA tenderness, left CVA tenderness, guarding or rebound.  Genitourinary:    Comments: Vulva without lesions or abnormality Vaginal canal with significant white curd-like discharge. No adnexal tenderness or CMT Musculoskeletal:     Cervical back: Normal range of motion.     Right lower leg: No edema.     Left lower leg: No edema.  Skin:    General: Skin is warm and dry.     Capillary Refill: Capillary refill takes less than 2 seconds.  Neurological:     Mental Status: She is alert and oriented to person, place, and time. Mental status is at baseline.  Psychiatric:        Mood and Affect: Mood normal.        Behavior: Behavior normal.     ED Results / Procedures / Treatments   Labs (all labs ordered are listed, but only abnormal results are displayed) Labs Reviewed  URINALYSIS, ROUTINE W REFLEX MICROSCOPIC - Abnormal; Notable for the following components:      Result Value   APPearance HAZY (*)    Ketones, ur 20 (*)    Protein, ur 30 (*)    Leukocytes,Ua LARGE (*)    Bacteria, UA FEW (*)    All other components within normal limits  WET PREP, GENITAL  I-STAT BETA HCG BLOOD, ED ([facility], [facility], [facility] ONLY)  GC/CHLAMYDIA PROBE AMP ([place]) NOT AT [facility]    EKG None  Radiology No results found.  Procedures Procedures (including critical care time)  Medications Ordered in ED Medications  fluconazole (DIFLUCAN) tablet 200 mg (has no administration in time range)    ED Course  I have reviewed the triage vital signs and the nursing notes.  Pertinent labs & imaging results that were available during my care of the  patient were reviewed by me and considered in my medical decision making (see chart for details).  Patient  29 year old female past medical history detailed below presented today for 6 days of itching in her vagina and burning and discomfort.  She has had yeast infections in the past.  She states this feels similar to that.  Physical exam is notable for thick white curd-like discharge.  No cervical motion tenderness or adnexal tenderness.  Vital signs are within normal is apart from significant hypertension.  Clinical Course as of Feb 04 2206  Mon Feb 05, 2020  2200 Urinalysis with evidence of contamination with significant squamous epithelials present.  There are few bacteria large leukocytes  Ketones/Protein likely secondary to poor oral intake however I evaluated patient and informed her of the need for close follow-up with PCP due to these abnormalities.  Urinalysis, Routine w reflex microscopic Urine, Clean Catch(!) [[facility]]  2201 Pregnancy test is negative.  I-Stat beta hCG blood, ED [[facility]]  2201 Collected and pending  GC/Chlamydia probe amp [[facility]]    Clinical Course User Index [[facility]] Tedd Sias, PA   Had long discussion with patient about her elevated blood pressure.  She will have this rechecked by PCP. She tells me that she has had an HIV test that was negative in the past and she states that she is not currently sexually active.  I do recommend that she gets follow-up testing soon as this appears to be a recurrent issue for her and she may have some immunocompromise other than her diabetes.  Suspect that her blood sugar is likely not well controlled however there is no glucose in her urine. MDM Rules/Calculators/A&P                          Wet prep does not show any yeast however clinically she does have yeast infection.  We will discharge with close follow-up with PCP.  Will treat with fluconazole.  Final Clinical Impression(s) / ED Diagnoses Final diagnoses:  Vaginal yeast infection    Rx / DC Orders ED Discharge Orders         Ordered    fluconazole (DIFLUCAN) 200 MG tablet   Weekly        02/05/20 2159           Tedd Sias, [place] 02/05/20 2223    [name], MD 02/06/20 0003

## 2020-02-06 LAB — GC/CHLAMYDIA PROBE AMP (~~LOC~~) NOT AT ARMC
Chlamydia: NEGATIVE
Comment: NEGATIVE
Comment: NORMAL
Neisseria Gonorrhea: NEGATIVE

## 2020-02-20 ENCOUNTER — Other Ambulatory Visit: Payer: Self-pay

## 2020-02-20 ENCOUNTER — Emergency Department (HOSPITAL_COMMUNITY)
Admission: EM | Admit: 2020-02-20 | Discharge: 2020-02-20 | Disposition: A | Discharge status: Home / Self Care | Payer: Medicaid Other | Attending: Emergency Medicine | Admitting: Emergency Medicine

## 2020-02-20 ENCOUNTER — Encounter (HOSPITAL_COMMUNITY): Payer: Self-pay

## 2020-02-20 DIAGNOSIS — E876 Hypokalemia: Secondary | ICD-10-CM | POA: Insufficient documentation

## 2020-02-20 DIAGNOSIS — E119 Type 2 diabetes mellitus without complications: Secondary | ICD-10-CM | POA: Diagnosis not present

## 2020-02-20 DIAGNOSIS — Z20822 Contact with and (suspected) exposure to covid-19: Secondary | ICD-10-CM | POA: Insufficient documentation

## 2020-02-20 DIAGNOSIS — Z7984 Long term (current) use of oral hypoglycemic drugs: Secondary | ICD-10-CM | POA: Insufficient documentation

## 2020-02-20 DIAGNOSIS — F17210 Nicotine dependence, cigarettes, uncomplicated: Secondary | ICD-10-CM | POA: Diagnosis not present

## 2020-02-20 DIAGNOSIS — K529 Noninfective gastroenteritis and colitis, unspecified: Secondary | ICD-10-CM | POA: Insufficient documentation

## 2020-02-20 DIAGNOSIS — R112 Nausea with vomiting, unspecified: Secondary | ICD-10-CM | POA: Diagnosis present

## 2020-02-20 DIAGNOSIS — I10 Essential (primary) hypertension: Secondary | ICD-10-CM | POA: Diagnosis not present

## 2020-02-20 DIAGNOSIS — Z79899 Other long term (current) drug therapy: Secondary | ICD-10-CM | POA: Insufficient documentation

## 2020-02-20 LAB — COMPREHENSIVE METABOLIC PANEL
ALT: 22 U/L (ref 0–44)
AST: 27 U/L (ref 15–41)
Albumin: 4 g/dL (ref 3.5–5.0)
Alkaline Phosphatase: 32 U/L — ABNORMAL LOW (ref 38–126)
Anion gap: 11 (ref 5–15)
BUN: 9 mg/dL (ref 6–20)
CO2: 26 mmol/L (ref 22–32)
Calcium: 9.6 mg/dL (ref 8.9–10.3)
Chloride: 103 mmol/L (ref 98–111)
Creatinine, Ser: 0.84 mg/dL (ref 0.44–1.00)
GFR, Estimated: >60 mL/min (ref >60)
Glucose, Bld: 92 mg/dL (ref 70–99)
Potassium: 3.2 mmol/L — ABNORMAL LOW (ref 3.5–5.1)
Sodium: 140 mmol/L (ref 135–145)
Total Bilirubin: 0.7 mg/dL (ref 0.3–1.2)
Total Protein: 7.1 g/dL (ref 6.5–8.1)

## 2020-02-20 LAB — I-STAT BETA HCG BLOOD, ED (MC, WL, AP ONLY): I-stat hCG, quantitative: <5 m[IU]/mL (ref <5)

## 2020-02-20 LAB — CBC
HCT: 38.5 % (ref 36.0–46.0)
Hemoglobin: 11.8 g/dL — ABNORMAL LOW (ref 12.0–15.0)
MCH: 26.2 pg (ref 26.0–34.0)
MCHC: 30.6 g/dL (ref 30.0–36.0)
MCV: 85.6 fL (ref 80.0–100.0)
Platelets: 284 10*3/uL (ref 150–400)
RBC: 4.5 MIL/uL (ref 3.87–5.11)
RDW: 14.1 % (ref 11.5–15.5)
WBC: 11.2 10*3/uL — ABNORMAL HIGH (ref 4.0–10.5)
nRBC: 0 % (ref 0.0–0.2)

## 2020-02-20 LAB — RESPIRATORY PANEL BY RT PCR (FLU A&B, COVID)
Influenza A by PCR: NEGATIVE
Influenza B by PCR: NEGATIVE
SARS Coronavirus 2 by RT PCR: NEGATIVE

## 2020-02-20 LAB — LIPASE, BLOOD: Lipase: 29 U/L (ref 11–51)

## 2020-02-20 MED ORDER — METOCLOPRAMIDE HCL 5 MG/ML IJ SOLN
10.0000 mg | Freq: Once | INTRAMUSCULAR | Status: AC
  Administered 2020-02-20: 10 mg via INTRAVENOUS
  Filled 2020-02-20: qty 2

## 2020-02-20 MED ORDER — POTASSIUM CHLORIDE CRYS ER 20 MEQ PO TBCR: 20.0000 meq | EXTENDED_RELEASE_TABLET | Freq: Every day | ORAL | 0 refills | Status: DC

## 2020-02-20 MED ORDER — SODIUM CHLORIDE 0.9 % IV BOLUS
1000.0000 mL | Freq: Once | INTRAVENOUS | Status: AC
  Administered 2020-02-20: 1000 mL via INTRAVENOUS

## 2020-02-20 MED ORDER — METOCLOPRAMIDE HCL 10 MG PO TABS: 10.0000 mg | ORAL_TABLET | Freq: Three times a day (TID) | ORAL | 0 refills | Status: DC | PRN

## 2020-02-20 MED ORDER — POTASSIUM CHLORIDE CRYS ER 20 MEQ PO TBCR
40.0000 meq | EXTENDED_RELEASE_TABLET | Freq: Once | ORAL | Status: AC
  Administered 2020-02-20: 40 meq via ORAL
  Filled 2020-02-20: qty 2

## 2020-02-20 NOTE — ED Triage Notes (Signed)
Pt BIB EMS from home. Pt reports waking up and feeling fatigued and took a nap. When she woke up from the nap she became nauseas and vomited a few times. Denies fever, cough, SHOB.

## 2020-02-20 NOTE — ED Provider Notes (Signed)
[facility] DEPT Provider Note   CSN: [id number] Arrival date & time: 02/20/20  1418     History Chief Complaint  Patient presents with  . Nausea  . Emesis    [name] is a 29 y.o. female.  HPI 29 year old female presents with nausea, vomiting, and diarrhea.  She states she was feeling fine yesterday but when she slept in this morning she knew something was wrong because she is feeling extra tired.  She has vomited 3 or 4 times without blood or bilious emesis.  She has had 3 episodes of nonbloody diarrhea.  No fevers, abdominal pain, cough, or shortness of breath.  She states she has no appetite and feels like she has lost her taste though her smell is intact.  She has not received the Covid vaccinations. No urinary symptoms.    Past Medical History:  Diagnosis Date  . Anxiety   . Bipolar 1 disorder ([place])   . Cognitive deficits   . Depression   . Diabetes mellitus without complication ([place])   . Hypertension   . Mental disorder   . Mental health disorder   . Obesity     Patient Active Problem List   Diagnosis Date Noted  . Schizophrenia ([place]) 09/13/2019  . Bipolar 1 disorder ([place]) 06/23/2019  . MDD (major depressive disorder) 10/10/2018  . Schizoaffective disorder, bipolar type ([place]) 09/25/2018  . Bipolar I disorder ([place]) 06/13/2018  . HTN (hypertension) 05/03/2018  . Tobacco use disorder 05/03/2018  . Bipolar I disorder, most recent episode depressed, severe without psychotic features ([place]) 05/02/2018  . Adjustment disorder with emotional disturbance 01/02/2018  . 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 Moderate bipolar I disorder, most recent episode depressed ([place])   . Psychosis ([place])   . 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 OCD (obsessive compulsive disorder) 10/05/2016  . Major depressive disorder, recurrent episode, mild ([place]) 05/04/2016  .  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 Learning disability 07/18/2015  . 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 Diabetes mellitus ([place]) 07/18/2015  . 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 Hyperlipidemia 07/18/2015  . Severe episode of recurrent major depressive disorder, without psychotic features ([place])   . Suicidal ideation   . Drug overdose   . Cognitive deficits 10/12/2012  . Generalized anxiety disorder 06/28/2012    Past Surgical History:  Procedure Laterality Date  . CESAREAN SECTION    . CESAREAN SECTION N/A 04/25/2013   Procedure: REPEAT CESAREAN SECTION;  Surgeon: [name], MD;  Location: [facility];  Service: Obstetrics;  Laterality: N/A;  . MASS EXCISION N/A 06/03/2012   Procedure: EXCISION MASS;  Surgeon: [name], MD;  Location: [facility];  Service: ENT;  Laterality: N/A;  Excision uvula mass  . TONSILLECTOMY N/A 06/03/2012   Procedure: TONSILLECTOMY;  Surgeon: [name], MD;  Location: [facility];  Service: ENT;  Laterality: N/A;  . TONSILLECTOMY       OB History    Gravida  3   Para  3   Term  3   Preterm  0   AB  0   Living  3     SAB  0   TAB  0   Ectopic  0   Multiple      Live Births  3           Family History  Problem Relation Age of Onset  . Hypertension Mother   . Diabetes Father     Social History   Tobacco Use  . Smoking status: Current Every Day Smoker    Packs/day: 4.00    Years: 11.00    Pack years: 44.00    Types: Cigarettes  . Smokeless tobacco: Never Used  . Tobacco comment: Pt declined  Vaping Use  . Vaping Use: Never used  Substance Use Topics  . Alcohol use: Not Currently    Comment: occ: last intake 4 mts ago  . Drug use: Not Currently    Types: "Crack" cocaine, Other-see comments    Comment: Patient reports hx of smoking Crack    Home Medications Prior to Admission medications   Medication Sig Start  Date End Date Taking? Authorizing Provider  ARIPiprazole ER (ABILIFY MAINTENA) 400 MG SRER injection Inject 2 mLs (400 mg total) into the muscle every 28 (twenty-eight) days. 06/28/19   Johnn Hai, MD  BANOPHEN 25 MG capsule Take 25 mg by mouth at bedtime. 10/19/19   [provider]  CHANTIX 0.5 MG tablet Take 0.5 mg by mouth 2 (two) times daily. 10/13/19   [provider]  cholecalciferol (VITAMIN D) 25 MCG (1000 UNIT) tablet Take 1,000 Units by mouth daily. 08/21/19   [provider]  gabapentin (NEURONTIN) 300 MG capsule Take 300 mg by mouth 3 (three) times daily as needed.  08/21/19   [provider]  glipiZIDE (GLUCOTROL XL) 10 MG 24 hr tablet Take 10 mg by mouth 2 (two) times daily before lunch and supper. Patient not taking: Reported on 10/16/2019    [provider]  hydrochlorothiazide (HYDRODIURIL) 25 MG tablet Take 25 mg by mouth daily. 08/21/19   [provider]  lamoTRIgine (LAMICTAL) 100 MG tablet Take 100 mg by mouth daily. 08/21/19   [provider]  LEVEMIR FLEXTOUCH 100 UNIT/ML FlexPen Inject 40 Units into the skin 2 (two) times daily.  08/09/19   [provider]  lithium carbonate (ESKALITH) 450 MG CR tablet Take 2 tablets (900 mg total) by mouth at bedtime. 06/02/19   Johnn Hai, MD  metoCLOPramide (REGLAN) 10 MG tablet Take 1 tablet (10 mg total) by mouth every 8 (eight) hours as needed for nausea. 02/20/20   Sherwood Gambler, MD  omeprazole (PRILOSEC) 20 MG capsule Take 20 mg by mouth daily. 11/13/19   [provider]  perphenazine (TRILAFON) 2 MG tablet Take 6 mg by mouth 2 (two) times daily.    [provider]  potassium chloride SA (KLOR-CON) 20 MEQ tablet Take 1 tablet (20 mEq total) by mouth daily. 02/20/20   Sherwood Gambler, MD  propranolol (INDERAL) 10 MG tablet Take 1 tablet (10 mg total) by mouth 2 (two) times daily. 06/02/19   Johnn Hai, MD  simvastatin (ZOCOR) 20 MG tablet Take 20 mg by  mouth at bedtime. 08/21/19   [provider]  vitamin B-12 (CYANOCOBALAMIN) 1000 MCG tablet Take 1,000 mcg by mouth daily. 11/13/19   [provider]    Allergies    Wellbutrin [bupropion], Omnipaque [iohexol], Penicillins, Cogentin [benztropine], Depakote er [divalproex sodium er], Divalproex sodium, and Iohexol  Review of Systems   Review of Systems  Constitutional: Negative for fever.  Respiratory: Negative for cough and shortness of breath.   Cardiovascular: Negative for chest pain.  Gastrointestinal: Positive for diarrhea, nausea and vomiting. Negative for abdominal pain and blood in stool.  All other systems reviewed and are negative.   Physical  Exam Updated Vital Signs BP 132/87 (BP Location: Right Arm)   Pulse 84   Temp 98.4 F (36.9 C) (Oral)   Resp 18   Ht 5\' 7"  (1.702 m)   Wt 117.9 kg   SpO2 100%   BMI 40.72 kg/m   Physical Exam Vitals and nursing note reviewed.  Constitutional:      Appearance: She is well-developed. She is obese.  HENT:     Head: Normocephalic and atraumatic.     Right Ear: External ear normal.     Left Ear: External ear normal.     Nose: Nose normal.  Eyes:     General:        Right eye: No discharge.        Left eye: No discharge.  Cardiovascular:     Rate and Rhythm: Normal rate and regular rhythm.     Heart sounds: Normal heart sounds.  Pulmonary:     Effort: Pulmonary effort is normal.     Breath sounds: Normal breath sounds.  Abdominal:     General: There is no distension.     Palpations: Abdomen is soft.     Tenderness: There is no abdominal tenderness.  Skin:    General: Skin is warm and dry.  Neurological:     Mental Status: She is alert.  Psychiatric:        Mood and Affect: Mood is not anxious.     ED Results / Procedures / Treatments   Labs (all labs ordered are listed, but only abnormal results are displayed) Labs Reviewed  COMPREHENSIVE METABOLIC PANEL - Abnormal; Notable for the following  components:      Result Value   Potassium 3.2 (*)    Alkaline Phosphatase 32 (*)    All other components within normal limits  CBC - Abnormal; Notable for the following components:   WBC 11.2 (*)    Hemoglobin 11.8 (*)    All other components within normal limits  RESPIRATORY PANEL BY RT PCR (FLU A&B, COVID)  LIPASE, BLOOD  I-STAT BETA HCG BLOOD, ED ([facility], [facility], [facility] ONLY)    EKG None  Radiology No results found.  Procedures Procedures (including critical care time)  Medications Ordered in ED Medications  sodium chloride 0.9 % bolus 1,000 mL (0 mLs Intravenous Stopped 02/20/20 1642)  metoCLOPramide (REGLAN) injection 10 mg (10 mg Intravenous Given 02/20/20 1504)  potassium chloride SA (KLOR-CON) CR tablet 40 mEq (40 mEq Oral Given 02/20/20 1634)    ED Course  I have reviewed the triage vital signs and the nursing notes.  Pertinent labs & imaging results that were available during my care of the patient were reviewed by me and considered in my medical decision making (see chart for details).    MDM Rules/Calculators/A&P                          Patient's abdominal exam is benign.  Mild leukocytosis is probably left shift from vomiting.  No vomiting in the emergency department.  She does have mild hypokalemia which will be repleted.  Otherwise, I do not think she needs emergent imaging and this is probably a viral gastroenteritis.  Covid testing is negative.  Discharged with antiemetics, potassium, and return precautions. Final Clinical Impression(s) / ED Diagnoses Final diagnoses:  Acute gastroenteritis  Hypokalemia    Rx / DC Orders ED Discharge Orders         Ordered    metoCLOPramide (REGLAN)  10 MG tablet  Every 8 hours PRN        02/20/20 1629    potassium chloride SA (KLOR-CON) 20 MEQ tablet  Daily,   Status:  Discontinued        02/20/20 1629    potassium chloride SA (KLOR-CON) 20 MEQ tablet  Daily        02/20/20 1630           Sherwood Gambler, MD 02/20/20  1646

## 2020-02-20 NOTE — Discharge Instructions (Addendum)
If you develop worsening, continued, or recurrent abdominal pain, uncontrolled vomiting, fever, chest or back pain, or any other new/concerning symptoms then return to the ER for evaluation.  

## 2020-03-06 ENCOUNTER — Encounter (HOSPITAL_COMMUNITY): Payer: Self-pay | Admitting: Registered Nurse

## 2020-03-06 ENCOUNTER — Ambulatory Visit (HOSPITAL_COMMUNITY)
Admission: EM | Admit: 2020-03-06 | Discharge: 2020-03-06 | Disposition: A | Discharge status: Home / Self Care | Payer: Medicaid Other | Attending: Registered Nurse | Admitting: Registered Nurse

## 2020-03-06 ENCOUNTER — Emergency Department (HOSPITAL_COMMUNITY)
Admission: EM | Admit: 2020-03-06 | Discharge: 2020-03-06 | Disposition: A | Discharge status: Home / Self Care | Payer: Medicaid Other | Attending: Emergency Medicine | Admitting: Emergency Medicine

## 2020-03-06 ENCOUNTER — Other Ambulatory Visit: Payer: Self-pay

## 2020-03-06 DIAGNOSIS — F32A Depression, unspecified: Secondary | ICD-10-CM | POA: Diagnosis present

## 2020-03-06 DIAGNOSIS — I10 Essential (primary) hypertension: Secondary | ICD-10-CM | POA: Diagnosis not present

## 2020-03-06 DIAGNOSIS — R4183 Borderline intellectual functioning: Secondary | ICD-10-CM | POA: Insufficient documentation

## 2020-03-06 DIAGNOSIS — E119 Type 2 diabetes mellitus without complications: Secondary | ICD-10-CM | POA: Insufficient documentation

## 2020-03-06 DIAGNOSIS — Z79899 Other long term (current) drug therapy: Secondary | ICD-10-CM | POA: Diagnosis not present

## 2020-03-06 DIAGNOSIS — F17210 Nicotine dependence, cigarettes, uncomplicated: Secondary | ICD-10-CM | POA: Diagnosis not present

## 2020-03-06 DIAGNOSIS — F4325 Adjustment disorder with mixed disturbance of emotions and conduct: Secondary | ICD-10-CM | POA: Diagnosis not present

## 2020-03-06 DIAGNOSIS — F419 Anxiety disorder, unspecified: Secondary | ICD-10-CM

## 2020-03-06 DIAGNOSIS — F250 Schizoaffective disorder, bipolar type: Secondary | ICD-10-CM | POA: Insufficient documentation

## 2020-03-06 DIAGNOSIS — R45851 Suicidal ideations: Secondary | ICD-10-CM | POA: Diagnosis not present

## 2020-03-06 DIAGNOSIS — F411 Generalized anxiety disorder: Secondary | ICD-10-CM | POA: Insufficient documentation

## 2020-03-06 LAB — COMPREHENSIVE METABOLIC PANEL
ALT: 24 U/L (ref 0–44)
AST: 25 U/L (ref 15–41)
Albumin: 4.2 g/dL (ref 3.5–5.0)
Alkaline Phosphatase: 39 U/L (ref 38–126)
Anion gap: 12 (ref 5–15)
BUN: 9 mg/dL (ref 6–20)
CO2: 23 mmol/L (ref 22–32)
Calcium: 9.4 mg/dL (ref 8.9–10.3)
Chloride: 104 mmol/L (ref 98–111)
Creatinine, Ser: 0.83 mg/dL (ref 0.44–1.00)
GFR, Estimated: >60 mL/min (ref >60)
Glucose, Bld: 110 mg/dL — ABNORMAL HIGH (ref 70–99)
Potassium: 3.4 mmol/L — ABNORMAL LOW (ref 3.5–5.1)
Sodium: 139 mmol/L (ref 135–145)
Total Bilirubin: 0.6 mg/dL (ref 0.3–1.2)
Total Protein: 7.2 g/dL (ref 6.5–8.1)

## 2020-03-06 LAB — SALICYLATE LEVEL: Salicylate Lvl: <7 mg/dL — ABNORMAL LOW (ref 7.0–30.0)

## 2020-03-06 LAB — CBC
HCT: 39.8 % (ref 36.0–46.0)
Hemoglobin: 12.4 g/dL (ref 12.0–15.0)
MCH: 25.8 pg — ABNORMAL LOW (ref 26.0–34.0)
MCHC: 31.2 g/dL (ref 30.0–36.0)
MCV: 82.7 fL (ref 80.0–100.0)
Platelets: 255 10*3/uL (ref 150–400)
RBC: 4.81 MIL/uL (ref 3.87–5.11)
RDW: 13.9 % (ref 11.5–15.5)
WBC: 14.5 10*3/uL — ABNORMAL HIGH (ref 4.0–10.5)
nRBC: 0 % (ref 0.0–0.2)

## 2020-03-06 LAB — I-STAT BETA HCG BLOOD, ED (NOT ORDERABLE): I-stat hCG, quantitative: <5 m[IU]/mL (ref <5)

## 2020-03-06 LAB — ETHANOL: Alcohol, Ethyl (B): <10 mg/dL (ref <10)

## 2020-03-06 LAB — ACETAMINOPHEN LEVEL: Acetaminophen (Tylenol), Serum: <10 ug/mL — ABNORMAL LOW (ref 10–30)

## 2020-03-06 MED ORDER — LORAZEPAM 1 MG PO TABS
1.0000 mg | ORAL_TABLET | Freq: Once | ORAL | Status: AC
  Administered 2020-03-06: 1 mg via ORAL
  Filled 2020-03-06: qty 1
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
## 2020-03-06 NOTE — Discharge Instructions (Addendum)
Take your meds as prescribed   Follow-up with your doctor and counselor  Return to the ER if you have thoughts of harming yourself or others, hallucinations, overdose

## 2020-03-06 NOTE — BHH Counselor (Signed)
Stacy Rankin, NP, called charged nurse Jamison to inform her that patient was just seen at [facility] and was psychiatrically cleared.
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
## 2020-03-06 NOTE — BH Assessment (Signed)
Patient ACTT team [facility] notified that pt was seen by NP and is being discharge from [facility]  [facility] notified that patient did not present with SI/HI/AVH and reported she wanted to go to a group home or either a shelter. ACTT provider plans to follow up with patient tomorrow.
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
## 2020-03-07 ENCOUNTER — Other Ambulatory Visit: Payer: Self-pay

## 2020-03-07 ENCOUNTER — Emergency Department (HOSPITAL_COMMUNITY)
Admission: EM | Admit: 2020-03-07 | Discharge: 2020-03-08 | Disposition: A | Discharge status: Other Acute Inpatient Hospital | Payer: Medicaid Other | Source: Home / Self Care | Attending: Emergency Medicine | Admitting: Emergency Medicine

## 2020-03-07 ENCOUNTER — Emergency Department (HOSPITAL_COMMUNITY)
Admission: EM | Admit: 2020-03-07 | Discharge: 2020-03-07 | Disposition: A | Discharge status: Home / Self Care | Payer: Medicaid Other | Attending: Emergency Medicine | Admitting: Emergency Medicine

## 2020-03-07 ENCOUNTER — Encounter (HOSPITAL_COMMUNITY): Payer: Self-pay | Admitting: Emergency Medicine

## 2020-03-07 DIAGNOSIS — R45851 Suicidal ideations: Secondary | ICD-10-CM | POA: Insufficient documentation

## 2020-03-07 DIAGNOSIS — Z046 Encounter for general psychiatric examination, requested by authority: Secondary | ICD-10-CM | POA: Insufficient documentation

## 2020-03-07 DIAGNOSIS — I10 Essential (primary) hypertension: Secondary | ICD-10-CM | POA: Insufficient documentation

## 2020-03-07 DIAGNOSIS — E119 Type 2 diabetes mellitus without complications: Secondary | ICD-10-CM | POA: Insufficient documentation

## 2020-03-07 DIAGNOSIS — F17210 Nicotine dependence, cigarettes, uncomplicated: Secondary | ICD-10-CM | POA: Insufficient documentation

## 2020-03-07 DIAGNOSIS — F419 Anxiety disorder, unspecified: Secondary | ICD-10-CM | POA: Insufficient documentation

## 2020-03-07 DIAGNOSIS — Z7984 Long term (current) use of oral hypoglycemic drugs: Secondary | ICD-10-CM | POA: Insufficient documentation

## 2020-03-07 DIAGNOSIS — Z79899 Other long term (current) drug therapy: Secondary | ICD-10-CM | POA: Insufficient documentation

## 2020-03-07 DIAGNOSIS — F1490 Cocaine use, unspecified, uncomplicated: Secondary | ICD-10-CM | POA: Insufficient documentation

## 2020-03-07 DIAGNOSIS — F332 Major depressive disorder, recurrent severe without psychotic features: Secondary | ICD-10-CM | POA: Insufficient documentation

## 2020-03-07 DIAGNOSIS — F4325 Adjustment disorder with mixed disturbance of emotions and conduct: Secondary | ICD-10-CM

## 2020-03-07 MED ORDER — LORAZEPAM 1 MG PO TABS
2.0000 mg | ORAL_TABLET | Freq: Once | ORAL | Status: AC
  Administered 2020-03-07: 2 mg via ORAL
  Filled 2020-03-07: qty 2
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
## 2020-03-07 NOTE — ED Provider Notes (Addendum)
[facility] DEPT Provider Note   CSN: [id number] Arrival date & time: 03/07/20  1701     History Chief Complaint  Patient presents with  . case management services    [name] is a 29 y.o. female.  Patient just discharged from the emergency department following behavioral health evaluation at about 3 PM this afternoon.  Patient was seen also yesterday at [facility].  Had lab work done.  And seen here in the early hours of the morning.  Evaluated by behavioral health and cleared for discharge home.  Patient now back stating that she is got suicidal ideation.  Did not take anything or overdose on anything no specific plan.  Will reconsult behavioral health.  Patient is on a care plan care plan noted.        Past Medical History:  Diagnosis Date  . Anxiety   . Bipolar 1 disorder ([place])   . Cognitive deficits   . Depression   . Diabetes mellitus without complication ([place])   . Hypertension   . Mental disorder   . Mental health disorder   . Obesity     Patient Active Problem List   Diagnosis Date Noted  . Anxiety state 03/06/2020  . Schizophrenia ([place]) 09/13/2019  . Bipolar 1 disorder ([place]) 06/23/2019  . MDD (major depressive disorder) 10/10/2018  . Schizoaffective disorder, bipolar type ([place]) 09/25/2018  . Bipolar I disorder ([place]) 06/13/2018  . HTN (hypertension) 05/03/2018  . Tobacco use disorder 05/03/2018  . Bipolar I disorder, most recent episode depressed, severe without psychotic features ([place]) 05/02/2018  . Adjustment disorder with emotional disturbance 01/02/2018  . 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 Moderate bipolar I disorder, most recent episode depressed ([place])   . Psychosis ([place])   . 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 OCD (obsessive compulsive disorder) 10/05/2016  . Major depressive disorder, recurrent episode, mild  ([place]) 05/04/2016  . 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 Learning disability 07/18/2015  . 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 Diabetes mellitus ([place]) 07/18/2015  . 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 Hyperlipidemia 07/18/2015  . Severe episode of recurrent major depressive disorder, without psychotic features ([place])   . Suicidal ideation   . Drug overdose   . Cognitive deficits 10/12/2012  . Generalized anxiety disorder 06/28/2012    Past Surgical History:  Procedure Laterality Date  . CESAREAN SECTION    . CESAREAN SECTION N/A 04/25/2013   Procedure: REPEAT CESAREAN SECTION;  Surgeon: [name], MD;  Location: [facility];  Service: Obstetrics;  Laterality: N/A;  . MASS EXCISION N/A 06/03/2012   Procedure: EXCISION MASS;  Surgeon: [name], MD;  Location: [facility];  Service: ENT;  Laterality: N/A;  Excision uvula mass  . TONSILLECTOMY N/A 06/03/2012   Procedure: TONSILLECTOMY;  Surgeon: [name], MD;  Location: [facility];  Service: ENT;  Laterality: N/A;  . TONSILLECTOMY       OB History    Gravida  3   Para  3   Term  3   Preterm  0   AB  0   Living  3     SAB  0   TAB  0   Ectopic  0   Multiple      Live Births  3           Family History  Problem  Relation Age of Onset  . Hypertension Mother   . Diabetes Father     Social History   Tobacco Use  . Smoking status: Current Every Day Smoker    Packs/day: 4.00    Years: 11.00    Pack years: 44.00    Types: Cigarettes  . Smokeless tobacco: Never Used  . Tobacco comment: Pt declined  Vaping Use  . Vaping Use: Never used  Substance Use Topics  . Alcohol use: Not Currently    Comment: occ: last intake 4 mts ago  . Drug use: Not Currently    Types: "Crack" cocaine, Other-see comments    Comment: Patient reports hx of smoking Crack    Home Medications Prior to Admission medications    Medication Sig Start Date End Date Taking? Authorizing Provider  ARIPiprazole ER (ABILIFY MAINTENA) 400 MG SRER injection Inject 2 mLs (400 mg total) into the muscle every 28 (twenty-eight) days. 06/28/19   Johnn Hai, MD  BANOPHEN 25 MG capsule Take 25 mg by mouth at bedtime. 10/19/19   [provider]  CHANTIX 0.5 MG tablet Take 0.5 mg by mouth 2 (two) times daily. 10/13/19   [provider]  cholecalciferol (VITAMIN D) 25 MCG (1000 UNIT) tablet Take 1,000 Units by mouth daily. 08/21/19   [provider]  gabapentin (NEURONTIN) 300 MG capsule Take 300 mg by mouth 3 (three) times daily as needed.  08/21/19   [provider]  glipiZIDE (GLUCOTROL XL) 10 MG 24 hr tablet Take 10 mg by mouth 2 (two) times daily before lunch and supper. Patient not taking: Reported on 10/16/2019    [provider]  hydrochlorothiazide (HYDRODIURIL) 25 MG tablet Take 25 mg by mouth daily. 08/21/19   [provider]  lamoTRIgine (LAMICTAL) 100 MG tablet Take 100 mg by mouth daily. 08/21/19   [provider]  LEVEMIR FLEXTOUCH 100 UNIT/ML FlexPen Inject 40 Units into the skin 2 (two) times daily.  08/09/19   [provider]  lithium carbonate (ESKALITH) 450 MG CR tablet Take 2 tablets (900 mg total) by mouth at bedtime. 06/02/19   Johnn Hai, MD  metoCLOPramide (REGLAN) 10 MG tablet Take 1 tablet (10 mg total) by mouth every 8 (eight) hours as needed for nausea. 02/20/20   Sherwood Gambler, MD  omeprazole (PRILOSEC) 20 MG capsule Take 20 mg by mouth daily. 11/13/19   [provider]  perphenazine (TRILAFON) 2 MG tablet Take 6 mg by mouth 2 (two) times daily.    [provider]  potassium chloride SA (KLOR-CON) 20 MEQ tablet Take 1 tablet (20 mEq total) by mouth daily. 02/20/20   Sherwood Gambler, MD  propranolol (INDERAL) 10 MG tablet Take 1 tablet (10 mg total) by mouth 2 (two) times daily. 06/02/19   Johnn Hai, MD  simvastatin (ZOCOR) 20 MG  tablet Take 20 mg by mouth at bedtime. 08/21/19   [provider]  vitamin B-12 (CYANOCOBALAMIN) 1000 MCG tablet Take 1,000 mcg by mouth daily. 11/13/19   [provider]    Allergies    Wellbutrin [bupropion], Omnipaque [iohexol], Penicillins, Cogentin [benztropine], Depakote er [divalproex sodium er], Divalproex sodium, and Iohexol  Review of Systems   Review of Systems  Constitutional: Negative for chills and fever.  HENT: Negative for rhinorrhea and sore throat.   Eyes: Negative for visual disturbance.  Respiratory: Negative for cough and shortness of breath.   Cardiovascular: Negative for chest pain and leg swelling.  Gastrointestinal: Negative for abdominal pain, diarrhea, nausea  and vomiting.  Genitourinary: Negative for dysuria.  Musculoskeletal: Negative for back pain and neck pain.  Skin: Negative for rash.  Neurological: Negative for dizziness, light-headedness and headaches.  Hematological: Does not bruise/bleed easily.  Psychiatric/Behavioral: Positive for suicidal ideas. Negative for confusion.    Physical Exam Updated Vital Signs BP (!) 127/51 (BP Location: Left Arm)   Pulse (!) 103   Temp 99.3 F (37.4 C) (Oral)   Resp 18   SpO2 100%   Physical Exam Vitals and nursing note reviewed.  Constitutional:      General: She is not in acute distress.    Appearance: She is well-developed.  HENT:     Head: Normocephalic and atraumatic.  Eyes:     Conjunctiva/sclera: Conjunctivae normal.     Pupils: Pupils are equal, round, and reactive to light.  Cardiovascular:     Rate and Rhythm: Normal rate and regular rhythm.     Heart sounds: No murmur heard.   Pulmonary:     Effort: Pulmonary effort is normal. No respiratory distress.     Breath sounds: Normal breath sounds.  Abdominal:     Palpations: Abdomen is soft.     Tenderness: There is no abdominal tenderness.  Musculoskeletal:        General: Normal range of motion.     Cervical back: Normal  range of motion and neck supple.  Skin:    General: Skin is warm and dry.  Neurological:     General: No focal deficit present.     Mental Status: She is alert and oriented to person, place, and time.     Cranial Nerves: No cranial nerve deficit.     Sensory: No sensory deficit.     Motor: No weakness.     ED Results / Procedures / Treatments   Labs (all labs ordered are listed, but only abnormal results are displayed) Labs Reviewed - No data to display  EKG None  Radiology No results found.  Procedures Procedures (including critical care time)  Medications Ordered in ED Medications - No data to display  ED Course  I have reviewed the triage vital signs and the nursing notes.  Pertinent labs & imaging results that were available during my care of the patient were reviewed by me and considered in my medical decision making (see chart for details).    MDM Rules/Calculators/A&P                           Patient is discharged home at 3 PM following behavioral health evaluation.  Patient's had labs within the last 24 hours.  Will reconsult behavioral health since patient now is back and stating that she needs to be admitted because she has suicidal ideation.  Patient also told nursing that she wanted placement.  Because of the suicidal ideation will need behavioral health to reevaluate.  Patient denies any ingestion or overdose intent.  No specific plan.   Final Clinical Impression(s) / ED Diagnoses Final diagnoses:  Suicidal ideation    Rx / DC Orders ED Discharge Orders    None       Fredia Sorrow, MD 03/07/20 1816   Patient accepted by behavioral health for admission overnight observation at the behavioral health urgent care unit.  Accepted by any [facility].  Patient will be transferred.  Transfer paperwork completed.    Fredia Sorrow, MD 03/07/20 2046
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
## 2020-03-07 NOTE — ED Notes (Signed)
Pt DCd off unit to home per provider. Pt alert , calm, cooperative, no s/s of distress. DC information given to pt, pt acknowledged understanding, pt ambulatory off unit to home, escorted by RN. Pt transported by ACT team.
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
## 2020-03-07 NOTE — BH Assessment (Addendum)
[place] Assessment Progress Note  Per Letitia Libra, NP, this voluntary pt does not require psychiatric hospitalization at this time.  Pt is psychiatrically cleared.  Discharge instructions advise pt to continue treatment with the [facility].  At Tina's request this writer called pt's ACT Team to arrange for her to be picked up.  Call was placed at 08:42 and I spoke to [place].  She reports that someone may be able to come to [facility] for her this afternoon.  She took my number and agrees to have someone call me back to finalize plans.  EDP Richard Roslynn Amble and pt's nurse, [name], have been notified.  Jalene Mullet, MA Triage Specialist [phone]   Addendum:  At 13:44 this write reached Barnabas Lister with the [facility].  He reports that he will be at [facility] in 30 to 60 minutes to pick her up.  Pertinent parties noted above have been updated, along with Ricquita Tarpley-Carter, CSW.  Jalene Mullet, [place] Coordinator [phone]

## 2020-03-07 NOTE — Discharge Instructions (Signed)
For your behavioral health needs, you are advised to continue treatment with the [facility] Team:       Psychotherapeutic Services ACT Team      [facility], [address]      [address]      [place], [place]  [address]      [phone]      Crisis number: [phone]

## 2020-03-08 ENCOUNTER — Ambulatory Visit (HOSPITAL_COMMUNITY)
Admission: EM | Admit: 2020-03-08 | Discharge: 2020-03-08 | Disposition: A | Discharge status: Home / Self Care | Payer: Medicaid Other | Attending: Physician Assistant | Admitting: Physician Assistant

## 2020-03-08 ENCOUNTER — Encounter (HOSPITAL_COMMUNITY): Payer: Self-pay | Admitting: Emergency Medicine

## 2020-03-08 ENCOUNTER — Other Ambulatory Visit: Payer: Self-pay

## 2020-03-08 DIAGNOSIS — Z20822 Contact with and (suspected) exposure to covid-19: Secondary | ICD-10-CM | POA: Insufficient documentation

## 2020-03-08 DIAGNOSIS — Z79899 Other long term (current) drug therapy: Secondary | ICD-10-CM | POA: Insufficient documentation

## 2020-03-08 DIAGNOSIS — F209 Schizophrenia, unspecified: Secondary | ICD-10-CM | POA: Insufficient documentation

## 2020-03-08 DIAGNOSIS — Z733 Stress, not elsewhere classified: Secondary | ICD-10-CM | POA: Insufficient documentation

## 2020-03-08 DIAGNOSIS — Z638 Other specified problems related to primary support group: Secondary | ICD-10-CM | POA: Insufficient documentation

## 2020-03-08 DIAGNOSIS — F319 Bipolar disorder, unspecified: Secondary | ICD-10-CM | POA: Insufficient documentation

## 2020-03-08 DIAGNOSIS — R45851 Suicidal ideations: Secondary | ICD-10-CM

## 2020-03-08 DIAGNOSIS — F17210 Nicotine dependence, cigarettes, uncomplicated: Secondary | ICD-10-CM | POA: Insufficient documentation

## 2020-03-08 DIAGNOSIS — Z5989 Other problems related to housing and economic circumstances: Secondary | ICD-10-CM | POA: Insufficient documentation

## 2020-03-08 DIAGNOSIS — F250 Schizoaffective disorder, bipolar type: Secondary | ICD-10-CM

## 2020-03-08 LAB — RESPIRATORY PANEL BY RT PCR (FLU A&B, COVID)
Influenza A by PCR: NEGATIVE
Influenza B by PCR: NEGATIVE
SARS Coronavirus 2 by RT PCR: NEGATIVE

## 2020-03-08 LAB — POC SARS CORONAVIRUS 2 AG: SARS Coronavirus 2 Ag: NEGATIVE

## 2020-03-08 LAB — POC SARS CORONAVIRUS 2 AG -  ED: SARS Coronavirus 2 Ag: NEGATIVE

## 2020-03-08 MED ORDER — INSULIN DETEMIR 100 UNIT/ML ~~LOC~~ SOLN
40.0000 [IU] | Freq: Two times a day (BID) | SUBCUTANEOUS | Status: DC
  Administered 2020-03-08: 40 [IU] via SUBCUTANEOUS
  Filled 2020-03-08 (×3): qty 0.4

## 2020-03-08 MED ORDER — INSULIN DETEMIR 100 UNIT/ML ~~LOC~~ SOLN: 40.0000 [IU] | Freq: Two times a day (BID) | SUBCUTANEOUS | 1 refills | Status: DC

## 2020-03-08 NOTE — ED Notes (Signed)
Pt standing at nurses station talking loudly with staff. Will continue to monitor for safety

## 2020-03-08 NOTE — Discharge Instructions (Addendum)

## 2020-03-08 NOTE — ED Triage Notes (Signed)
[facility] transfer, pt presents with self harm thoughts.

## 2020-03-08 NOTE — ED Notes (Signed)
Pt belongings placed in locker 5 (tan toned purse and black sneakers)

## 2020-03-08 NOTE — ED Notes (Signed)
Sleeping at present, no distress noted, monitoring for safety.
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
## 2020-03-08 NOTE — ED Notes (Signed)
Patient offered breakfast; declined.

## 2020-03-08 NOTE — ED Notes (Signed)
Pt given banana nut muffin and orange juice

## 2020-03-08 NOTE — BHH Counselor (Signed)
TTS contacted [facility] and spoke with Barnabas Lister [phone] to notify this patient is here. He states he just picked her up from the ED yesterday. He states this is her pattern of behavior. This counselor told him patient requesting to go to [facility]. He states when he gets to the office he will see if he has any meds available for her to take with her.   This counselor contacted [employer]. Women coordinator not in the office until 0830. Left a HIPPA compliant voice mail at [phone] about bed availability.

## 2020-03-08 NOTE — ED Notes (Signed)
Pt educated on avs and meds. Verbalized understanding. Escorted to retrieve belongings. Ambulated per self. No new issues noted. No SI, HI, or AVH noted. Escorted to back [place] and safe transport. Stable at time of d/c
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
## 2020-03-08 NOTE — ED Notes (Signed)
PA Eddie at bedside to assess pt.

## 2020-03-08 NOTE — ED Provider Notes (Signed)
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 10/16/2019 0.5  0.3 -  1.2 mg/dL Final  . GFR calc non Af Amer 10/16/2019 >60  >60 mL/min Final  . GFR calc Af Amer 10/16/2019 >60  >60 mL/min Final  . Anion gap 10/16/2019 9  5 - 15 Final   Performed at [facility] [address], [place], [place] [address]  . Alcohol, Ethyl (B) 10/16/2019 <10  <10 mg/dL Final   Comment: (NOTE) Lowest detectable limit for serum alcohol is  10 mg/dL.  For medical purposes only. Performed at [facility], [place] [address] [place], [place] [address]   . Salicylate Lvl 35/57/3220 <7.0* 7.0 - 30.0 mg/dL Final   Performed at [facility] [address] [place], [place] [address]  . Acetaminophen (Tylenol), Serum 10/16/2019 <10* 10 - 30 ug/mL Final   Comment: (NOTE) Therapeutic concentrations vary significantly. A range of 10-30 ug/mL  may be an effective concentration for many patients. However, some  are best treated at concentrations outside of this range. Acetaminophen concentrations >150 ug/mL at 4 hours after ingestion  and >50 ug/mL at 12 hours after ingestion are often associated with  toxic reactions.  Performed at [facility], [place] [address], [place], [place] [address]   . WBC 10/16/2019 11.5* 4.0 - 10.5 K/uL Final  . RBC 10/16/2019 4.55  3.87 - 5.11 MIL/uL Final  . Hemoglobin 10/16/2019 12.1  12.0 - 15.0 g/dL Final  . HCT 10/16/2019 38.6  36 - 46 % Final  . MCV 10/16/2019 84.8  80.0 - 100.0 fL Final  . MCH 10/16/2019 26.6  26.0 - 34.0 pg Final  . MCHC 10/16/2019 31.3  30.0 - 36.0 g/dL Final  . RDW 10/16/2019 14.3  11.5 - 15.5 % Final  . Platelets 10/16/2019 259  150 - 400 K/uL Final  . nRBC 10/16/2019 0.0  0.0 - 0.2 % Final   Performed at [facility], [place] [address] [place], [place] [address]  . Opiates 10/16/2019 NONE DETECTED  NONE DETECTED Final  . Cocaine 10/16/2019 NONE DETECTED  NONE DETECTED Final  . Benzodiazepines 10/16/2019 NONE DETECTED  NONE DETECTED Final  . Amphetamines 10/16/2019 NONE DETECTED  NONE DETECTED  Final  . Tetrahydrocannabinol 10/16/2019 NONE DETECTED  NONE DETECTED Final  . Barbiturates 10/16/2019 NONE DETECTED  NONE DETECTED Final   Comment: (NOTE) DRUG SCREEN FOR MEDICAL PURPOSES ONLY.  IF CONFIRMATION IS NEEDED FOR ANY PURPOSE, NOTIFY LAB WITHIN 5 DAYS.  LOWEST DETECTABLE LIMITS FOR URINE DRUG SCREEN Drug Class                     Cutoff (ng/mL) Amphetamine and metabolites    1000 Barbiturate and metabolites    200 Benzodiazepine                 517 Tricyclics and metabolites     300 Opiates and metabolites        300 Cocaine and metabolites        300 THC                            50 Performed at [facility], [place] [address], [place], [place] [address]   . I-stat hCG, quantitative 10/16/2019 <5.0  <5 mIU/mL Final  . Comment 3 10/16/2019          Final   Comment:   GEST. AGE      CONC.  (mIU/mL)   <=1 WEEK        5 - 50     2 WEEKS       50 - 500     3 WEEKS       100 - 10,000     4 WEEKS     1,000 - 30,000        FEMALE AND NON-PREGNANT FEMALE:     LESS THAN 5 mIU/mL   . SARS Coronavirus  2 10/16/2019 NEGATIVE  NEGATIVE Final   Comment: (NOTE) SARS-CoV-2 target nucleic acids are NOT DETECTED.  The SARS-CoV-2 RNA is generally detectable in upper and lower respiratory specimens during the acute phase of infection. The lowest concentration of SARS-CoV-2 viral copies this assay can detect is 250 copies / mL. A negative result does not preclude SARS-CoV-2 infection and should not be used as the sole basis for treatment or other patient management decisions.  A negative result may occur with improper specimen collection / handling, submission of specimen other than nasopharyngeal swab, presence of viral mutation(s) within the areas targeted by this assay, and inadequate number of viral copies (<250 copies / mL). A negative result must be combined with clinical observations, patient history, and epidemiological information.  Fact Sheet for Patients:    StrictlyIdeas.no  Fact Sheet for Healthcare Providers: BankingDealers.co.za  This test is not yet approved or                           cleared by the [place] FDA and has been authorized for detection and/or diagnosis of SARS-CoV-2 by FDA under an Emergency Use Authorization (EUA).  This EUA will remain in effect (meaning this test can be used) for the duration of the COVID-19 declaration under Section 564(b)(1) of the Act, 21 U.S.C. section 360bbb-3(b)(1), unless the authorization is terminated or revoked sooner.  Performed at [facility], [place] [address] [place], [place] [address]   . Lithium Lvl 10/16/2019 0.39* 0.60 - 1.20 mmol/L Final   Performed at [facility], [place] [address], [place], [place] [address]  . Glucose-Capillary 10/17/2019 141* 70 - 99 mg/dL Final   Glucose reference range applies only to samples taken after fasting for at least 8 hours.  Admission on 10/10/2019, Discharged on 10/11/2019  Component Date Value Ref Range Status  . Glucose-Capillary 10/10/2019 126* 70 - 99 mg/dL Final   Glucose reference range applies only to samples taken after fasting for at least 8 hours.  . Glucose-Capillary 10/11/2019 112* 70 - 99 mg/dL Final   Glucose reference range applies only to samples taken after fasting for at least 8 hours.  . Glucose-Capillary 10/11/2019 122* 70 - 99 mg/dL Final   Glucose reference range applies only to samples taken after fasting for at least 8 hours.  Admission on 10/10/2019, Discharged on 10/10/2019  Component Date Value Ref Range Status  . WBC 10/10/2019 12.0* 4.0 - 10.5 K/uL Final  . RBC 10/10/2019 4.57  3.87 - 5.11 MIL/uL Final  . Hemoglobin 10/10/2019 12.2  12.0 - 15.0 g/dL Final  . HCT 10/10/2019 39.9  36 - 46 % Final  . MCV 10/10/2019 87.3  80.0 - 100.0 fL Final  . MCH 10/10/2019 26.7  26.0 - 34.0 pg Final  . MCHC 10/10/2019 30.6  30.0 - 36.0 g/dL Final  . RDW 10/10/2019  15.3  11.5 - 15.5 % Final  . Platelets 10/10/2019 217  150 - 400 K/uL Final  . nRBC 10/10/2019 0.0  0.0 - 0.2 % Final  . Neutrophils Relative % 10/10/2019 58  % Final  . Neutro Abs 10/10/2019 6.9  1.7 - 7.7 K/uL Final  . Lymphocytes Relative 10/10/2019 30  % Final  . Lymphs Abs 10/10/2019 3.6  0.7 - 4.0 K/uL Final  . Monocytes Relative 10/10/2019 7  % Final  . Monocytes Absolute 10/10/2019 0.8  0.1 - 1.0 K/uL Final  . Eosinophils Relative 10/10/2019 4  %  Final  . Eosinophils Absolute 10/10/2019 0.5  0.0 - 0.5 K/uL Final  . Basophils Relative 10/10/2019 1  % Final  . Basophils Absolute 10/10/2019 0.1  0.0 - 0.1 K/uL Final  . Immature Granulocytes 10/10/2019 0  % Final  . Abs Immature Granulocytes 10/10/2019 0.05  0.00 - 0.07 K/uL Final   Performed at [facility], [place] [address] [place], [place] [address]  . Sodium 10/10/2019 141  135 - 145 mmol/L Final  . Potassium 10/10/2019 3.7  3.5 - 5.1 mmol/L Final  . Chloride 10/10/2019 105  98 - 111 mmol/L Final  . CO2 10/10/2019 24  22 - 32 mmol/L Final  . Glucose, Bld 10/10/2019 102* 70 - 99 mg/dL Final   Glucose reference range applies only to samples taken after fasting for at least 8 hours.  . BUN 10/10/2019 13  6 - 20 mg/dL Final  . Creatinine, Ser 10/10/2019 0.97  0.44 - 1.00 mg/dL Final  . Calcium 10/10/2019 9.3  8.9 - 10.3 mg/dL Final  . Total Protein 10/10/2019 7.8  6.5 - 8.1 g/dL Final  . Albumin 10/10/2019 4.4  3.5 - 5.0 g/dL Final  . AST 10/10/2019 21  15 - 41 U/L Final  . ALT 10/10/2019 20  0 - 44 U/L Final  . Alkaline Phosphatase 10/10/2019 33* 38 - 126 U/L Final  . Total Bilirubin 10/10/2019 0.7  0.3 - 1.2 mg/dL Final  . GFR calc non Af Amer 10/10/2019 >60  >60 mL/min Final  . GFR calc Af Amer 10/10/2019 >60  >60 mL/min Final  . Anion gap 10/10/2019 12  5 - 15 Final   Performed at [facility], [place] [address] [place], [place] [address]  . Alcohol, Ethyl (B) 10/10/2019 <10  <10 mg/dL  Final   Comment: (NOTE) Lowest detectable limit for serum alcohol is 10 mg/dL.  For medical purposes only. Performed at [facility], [place] [address], [place], [place] [address]   . Acetaminophen (Tylenol), Serum 10/10/2019 <10* 10 - 30 ug/mL Final   Comment: (NOTE) Therapeutic concentrations vary significantly. A range of 10-30 ug/mL  may be an effective concentration for many patients. However, some  are best treated at concentrations outside of this range. Acetaminophen concentrations >150 ug/mL at 4 hours after ingestion  and >50 ug/mL at 12 hours after ingestion are often associated with  toxic reactions.  Performed at [facility], [place] [address], [place], [place] [address]   . Salicylate Lvl 96/78/9381 <7.0* 7.0 - 30.0 mg/dL Final   Performed at [facility] [address] [place], [place] [address]  . I-stat hCG, quantitative 10/10/2019 <5.0  <5 mIU/mL Final  . Comment 3 10/10/2019          Final   Comment:   GEST. AGE      CONC.  (mIU/mL)   <=1 WEEK        5 - 50     2 WEEKS       50 - 500     3 WEEKS       100 - 10,000     4 WEEKS     1,000 - 30,000        FEMALE AND NON-PREGNANT FEMALE:     LESS THAN 5 mIU/mL Performed at [facility], [place] [address], [place], [place] [address]   . Lithium Lvl 10/10/2019 0.78  0.60 - 1.20 mmol/L Final   Performed at [facility]  [place] [address], [place], [place] [address]  . SARS Coronavirus 2 10/10/2019 NEGATIVE  NEGATIVE Final   Comment: (NOTE) SARS-CoV-2 target nucleic acids are NOT DETECTED.  The SARS-CoV-2 RNA is generally detectable in upper and lower respiratory specimens during the acute phase of infection. The lowest concentration of SARS-CoV-2 viral copies this assay can detect is 250 copies / mL. A negative result does not preclude SARS-CoV-2 infection and should not be used as the sole basis for treatment or other patient  management decisions.  A negative result may occur with improper specimen collection / handling, submission of specimen other than nasopharyngeal swab, presence of viral mutation(s) within the areas targeted by this assay, and inadequate number of viral copies (<250 copies / mL). A negative result must be combined with clinical observations, patient history, and epidemiological information.  Fact Sheet for Patients:   StrictlyIdeas.no  Fact Sheet for Healthcare Providers: BankingDealers.co.za  This test is not yet approved or                           cleared by the [place] FDA and has been authorized for detection and/or diagnosis of SARS-CoV-2 by FDA under an Emergency Use Authorization (EUA).  This EUA will remain in effect (meaning this test can be used) for the duration of the COVID-19 declaration under Section 564(b)(1) of the Act, 21 U.S.C. section 360bbb-3(b)(1), unless the authorization is terminated or revoked sooner.  Performed at [facility], [place] [address], [place], [place] [address]   . Hgb A1c MFr Bld 10/10/2019 6.3* 4.8 - 5.6 % Final   Comment: (NOTE) Pre diabetes:          5.7%-6.4%  Diabetes:              >6.4%  Glycemic control for   <7.0% adults with diabetes   . Mean Plasma Glucose 10/10/2019 134.11  mg/dL Final   Performed at [facility] [address], [place], [place] [address]  . Glucose-Capillary 10/10/2019 115* 70 - 99 mg/dL Final   Glucose reference range applies only to samples taken after fasting for at least 8 hours.  . Glucose-Capillary 10/10/2019 139* 70 - 99 mg/dL Final   Glucose reference range applies only to samples taken after fasting for at least 8 hours.  Admission on 10/05/2019, Discharged on 10/06/2019  Component Date Value Ref Range Status  . SARS Coronavirus 2 10/05/2019 NEGATIVE  NEGATIVE Final   Comment: (NOTE) SARS-CoV-2 target nucleic acids are  NOT DETECTED.  The SARS-CoV-2 RNA is generally detectable in upper and lower respiratory specimens during the acute phase of infection. The lowest concentration of SARS-CoV-2 viral copies this assay can detect is 250 copies / mL. A negative result does not preclude SARS-CoV-2 infection and should not be used as the sole basis for treatment or other patient management decisions.  A negative result may occur with improper specimen collection / handling, submission of specimen other than nasopharyngeal swab, presence of viral mutation(s) within the areas targeted by this assay, and inadequate number of viral copies (<250 copies / mL). A negative result must be combined with clinical observations, patient history, and epidemiological information.  Fact Sheet for Patients:   StrictlyIdeas.no  Fact Sheet for Healthcare Providers: BankingDealers.co.za  This test is not yet approved or  cleared by the [place] and has been authorized for detection and/or diagnosis of SARS-CoV-2 by FDA under an Emergency Use Authorization (EUA).  This EUA will remain in effect (meaning this test can be used) for the duration of the COVID-19 declaration under Section 564(b)(1) of the Act, 21 U.S.C. section 360bbb-3(b)(1), unless the authorization is terminated or revoked sooner.  Performed at [facility], [place] [address], [place], [place] [address]   . I-stat hCG, quantitative 10/05/2019 <5.0  <5 mIU/mL Final  . Comment 3 10/05/2019          Final   Comment:   GEST. AGE      CONC.  (mIU/mL)   <=1 WEEK        5 - 50     2 WEEKS       50 - 500     3 WEEKS       100 - 10,000     4 WEEKS     1,000 - 30,000        FEMALE AND NON-PREGNANT FEMALE:     LESS THAN 5 mIU/mL   There may be more visits with results that are not included.    Allergies: Wellbutrin [bupropion], Omnipaque [iohexol], Penicillins,  Cogentin [benztropine], Depakote er [divalproex sodium er], Divalproex sodium, and Iohexol  PTA Medications: (Not in a hospital admission)   Medical Decision Making  Based on my evaluation, patient meets criteria for [facility] admission. Covid 19 tests have been ordered and initiated for the patient.    Recommendations  Based on my evaluation the patient does not appear to have an emergency medical condition. Patient shows strong interests for alternative housing resources upon discharge.  Malachy Mood, PA 03/08/20  6:17 AM

## 2020-04-03 ENCOUNTER — Other Ambulatory Visit: Payer: Self-pay

## 2020-04-03 ENCOUNTER — Emergency Department (HOSPITAL_COMMUNITY): Payer: Medicaid Other

## 2020-04-03 ENCOUNTER — Encounter (HOSPITAL_COMMUNITY): Payer: Self-pay | Admitting: *Deleted

## 2020-04-03 ENCOUNTER — Emergency Department (HOSPITAL_COMMUNITY)
Admission: EM | Admit: 2020-04-03 | Discharge: 2020-04-03 | Disposition: A | Discharge status: Home / Self Care | Payer: Medicaid Other | Attending: Emergency Medicine | Admitting: Emergency Medicine

## 2020-04-03 DIAGNOSIS — R079 Chest pain, unspecified: Secondary | ICD-10-CM | POA: Insufficient documentation

## 2020-04-03 DIAGNOSIS — Z5321 Procedure and treatment not carried out due to patient leaving prior to being seen by health care provider: Secondary | ICD-10-CM | POA: Diagnosis not present

## 2020-04-03 LAB — CBC
HCT: 37.2 % (ref 36.0–46.0)
Hemoglobin: 11.5 g/dL — ABNORMAL LOW (ref 12.0–15.0)
MCH: 25.3 pg — ABNORMAL LOW (ref 26.0–34.0)
MCHC: 30.9 g/dL (ref 30.0–36.0)
MCV: 81.8 fL (ref 80.0–100.0)
Platelets: 321 10*3/uL (ref 150–400)
RBC: 4.55 MIL/uL (ref 3.87–5.11)
RDW: 14.4 % (ref 11.5–15.5)
WBC: 13.9 10*3/uL — ABNORMAL HIGH (ref 4.0–10.5)
nRBC: 0 % (ref 0.0–0.2)

## 2020-04-03 LAB — BASIC METABOLIC PANEL
Anion gap: 9 (ref 5–15)
BUN: 8 mg/dL (ref 6–20)
CO2: 24 mmol/L (ref 22–32)
Calcium: 9.4 mg/dL (ref 8.9–10.3)
Chloride: 105 mmol/L (ref 98–111)
Creatinine, Ser: 0.84 mg/dL (ref 0.44–1.00)
GFR, Estimated: >60 mL/min (ref >60)
Glucose, Bld: 110 mg/dL — ABNORMAL HIGH (ref 70–99)
Potassium: 3.8 mmol/L (ref 3.5–5.1)
Sodium: 138 mmol/L (ref 135–145)

## 2020-04-03 LAB — I-STAT BETA HCG BLOOD, ED (MC, WL, AP ONLY): I-stat hCG, quantitative: <5 m[IU]/mL (ref <5)

## 2020-04-03 LAB — TROPONIN I (HIGH SENSITIVITY)
Troponin I (High Sensitivity): 3 ng/L (ref <18)
Troponin I (High Sensitivity): <2 ng/L (ref <18)

## 2020-04-03 NOTE — ED Triage Notes (Signed)
Arrives via [facility] from home for anterior chest pain for 1 hour ago, awoke from sleep, palpation makes it worse. Central, non radiating. Hx of anxiety of panic attacks. Recently prescribed BP meds and ran out of it. 12 lead unremarkable. VSS en route. 140/90, hr 90's.

## 2020-04-03 NOTE — ED Notes (Signed)
Pt reports sharp chest pain, onset while she was sleeping. Reports that laying flat makes the pain worse.

## 2020-04-03 NOTE — ED Notes (Signed)
Tina(NT) called for pt for room, no response.

## 2020-04-06 ENCOUNTER — Other Ambulatory Visit: Payer: Self-pay

## 2020-04-06 ENCOUNTER — Emergency Department (HOSPITAL_COMMUNITY)
Admission: EM | Admit: 2020-04-06 | Discharge: 2020-04-06 | Disposition: A | Discharge status: Home / Self Care | Payer: Medicaid Other | Attending: Emergency Medicine | Admitting: Emergency Medicine

## 2020-04-06 ENCOUNTER — Encounter (HOSPITAL_COMMUNITY): Payer: Self-pay | Admitting: *Deleted

## 2020-04-06 DIAGNOSIS — N898 Other specified noninflammatory disorders of vagina: Secondary | ICD-10-CM | POA: Diagnosis not present

## 2020-04-06 DIAGNOSIS — Z5321 Procedure and treatment not carried out due to patient leaving prior to being seen by health care provider: Secondary | ICD-10-CM | POA: Diagnosis not present

## 2020-04-06 LAB — I-STAT BETA HCG BLOOD, ED (MC, WL, AP ONLY): I-stat hCG, quantitative: <5 m[IU]/mL (ref <5)

## 2020-04-06 NOTE — ED Notes (Signed)
Patient has a light green, 3 golds, and a lavender

## 2020-04-06 NOTE — ED Notes (Signed)
Pt left the lobby @ 12.10pm. Pt. Was called x3 before taking off the floor. Nurses aware.

## 2020-04-06 NOTE — ED Triage Notes (Signed)
2 days of vaginal burning and thick white discharge.

## 2020-04-10 ENCOUNTER — Emergency Department (HOSPITAL_COMMUNITY)
Admission: EM | Admit: 2020-04-10 | Discharge: 2020-04-11 | Disposition: A | Discharge status: Other Acute Inpatient Hospital | Payer: Medicaid Other | Attending: Emergency Medicine | Admitting: Emergency Medicine

## 2020-04-10 ENCOUNTER — Encounter (HOSPITAL_COMMUNITY): Payer: Self-pay

## 2020-04-10 DIAGNOSIS — F314 Bipolar disorder, current episode depressed, severe, without psychotic features: Secondary | ICD-10-CM | POA: Diagnosis not present

## 2020-04-10 DIAGNOSIS — F17210 Nicotine dependence, cigarettes, uncomplicated: Secondary | ICD-10-CM | POA: Diagnosis not present

## 2020-04-10 DIAGNOSIS — Z794 Long term (current) use of insulin: Secondary | ICD-10-CM | POA: Insufficient documentation

## 2020-04-10 DIAGNOSIS — E119 Type 2 diabetes mellitus without complications: Secondary | ICD-10-CM | POA: Insufficient documentation

## 2020-04-10 DIAGNOSIS — I10 Essential (primary) hypertension: Secondary | ICD-10-CM | POA: Insufficient documentation

## 2020-04-10 DIAGNOSIS — F250 Schizoaffective disorder, bipolar type: Secondary | ICD-10-CM | POA: Insufficient documentation

## 2020-04-10 DIAGNOSIS — Z79899 Other long term (current) drug therapy: Secondary | ICD-10-CM | POA: Insufficient documentation

## 2020-04-10 DIAGNOSIS — Z20822 Contact with and (suspected) exposure to covid-19: Secondary | ICD-10-CM | POA: Insufficient documentation

## 2020-04-10 DIAGNOSIS — R45851 Suicidal ideations: Secondary | ICD-10-CM | POA: Insufficient documentation

## 2020-04-10 DIAGNOSIS — F329 Major depressive disorder, single episode, unspecified: Secondary | ICD-10-CM | POA: Diagnosis present

## 2020-04-10 LAB — RAPID URINE DRUG SCREEN, HOSP PERFORMED
Amphetamines: NOT DETECTED
Barbiturates: NOT DETECTED
Benzodiazepines: NOT DETECTED
Cocaine: NOT DETECTED
Opiates: NOT DETECTED
Tetrahydrocannabinol: NOT DETECTED

## 2020-04-10 LAB — COMPREHENSIVE METABOLIC PANEL
ALT: 19 U/L (ref 0–44)
AST: 22 U/L (ref 15–41)
Albumin: 4 g/dL (ref 3.5–5.0)
Alkaline Phosphatase: 36 U/L — ABNORMAL LOW (ref 38–126)
Anion gap: 9 (ref 5–15)
BUN: 8 mg/dL (ref 6–20)
CO2: 25 mmol/L (ref 22–32)
Calcium: 9 mg/dL (ref 8.9–10.3)
Chloride: 107 mmol/L (ref 98–111)
Creatinine, Ser: 0.87 mg/dL (ref 0.44–1.00)
GFR, Estimated: >60 mL/min (ref >60)
Glucose, Bld: 90 mg/dL (ref 70–99)
Potassium: 3.6 mmol/L (ref 3.5–5.1)
Sodium: 141 mmol/L (ref 135–145)
Total Bilirubin: 0.7 mg/dL (ref 0.3–1.2)
Total Protein: 7.1 g/dL (ref 6.5–8.1)

## 2020-04-10 LAB — CBC
HCT: 35.2 % — ABNORMAL LOW (ref 36.0–46.0)
Hemoglobin: 11 g/dL — ABNORMAL LOW (ref 12.0–15.0)
MCH: 25.9 pg — ABNORMAL LOW (ref 26.0–34.0)
MCHC: 31.3 g/dL (ref 30.0–36.0)
MCV: 83 fL (ref 80.0–100.0)
Platelets: 262 10*3/uL (ref 150–400)
RBC: 4.24 MIL/uL (ref 3.87–5.11)
RDW: 15.2 % (ref 11.5–15.5)
WBC: 10.1 10*3/uL (ref 4.0–10.5)
nRBC: 0 % (ref 0.0–0.2)

## 2020-04-10 LAB — CBG MONITORING, ED: Glucose-Capillary: 108 mg/dL — ABNORMAL HIGH (ref 70–99)

## 2020-04-10 LAB — ETHANOL: Alcohol, Ethyl (B): <10 mg/dL (ref <10)

## 2020-04-10 LAB — ACETAMINOPHEN LEVEL: Acetaminophen (Tylenol), Serum: <10 ug/mL — ABNORMAL LOW (ref 10–30)

## 2020-04-10 LAB — I-STAT BETA HCG BLOOD, ED (MC, WL, AP ONLY): I-stat hCG, quantitative: <5 m[IU]/mL (ref <5)

## 2020-04-10 LAB — SALICYLATE LEVEL: Salicylate Lvl: <7 mg/dL — ABNORMAL LOW (ref 7.0–30.0)

## 2020-04-10 LAB — RESP PANEL BY RT-PCR (FLU A&B, COVID) ARPGX2
Influenza A by PCR: NEGATIVE
Influenza B by PCR: NEGATIVE
SARS Coronavirus 2 by RT PCR: NEGATIVE

## 2020-04-10 LAB — LITHIUM LEVEL: Lithium Lvl: 0.36 mmol/L — ABNORMAL LOW (ref 0.60–1.20)

## 2020-04-10 MED ORDER — SIMVASTATIN 20 MG PO TABS
20.0000 mg | ORAL_TABLET | Freq: Every day | ORAL | Status: DC
  Administered 2020-04-10: 20 mg via ORAL
  Filled 2020-04-10: qty 1

## 2020-04-10 MED ORDER — PERPHENAZINE 4 MG PO TABS
6.0000 mg | ORAL_TABLET | Freq: Two times a day (BID) | ORAL | Status: DC
  Administered 2020-04-10: 6 mg via ORAL
  Filled 2020-04-10: qty 1

## 2020-04-10 MED ORDER — INSULIN DETEMIR 100 UNIT/ML ~~LOC~~ SOLN
40.0000 [IU] | Freq: Two times a day (BID) | SUBCUTANEOUS | Status: DC
  Administered 2020-04-10: 40 [IU] via SUBCUTANEOUS
  Filled 2020-04-10: qty 0.4

## 2020-04-10 MED ORDER — GABAPENTIN 300 MG PO CAPS: 300.0000 mg | ORAL_CAPSULE | Freq: Three times a day (TID) | ORAL | Status: DC | PRN

## 2020-04-10 MED ORDER — DIPHENHYDRAMINE HCL 25 MG PO CAPS
25.0000 mg | ORAL_CAPSULE | Freq: Every day | ORAL | Status: DC
Start: 2020-04-10 — End: 2020-04-11
  Administered 2020-04-10: 25 mg via ORAL
  Filled 2020-04-10: qty 1

## 2020-04-10 MED ORDER — LAMOTRIGINE 100 MG PO TABS
100.0000 mg | ORAL_TABLET | Freq: Every day | ORAL | Status: DC
  Administered 2020-04-10: 100 mg via ORAL
  Filled 2020-04-10: qty 1

## 2020-04-10 MED ORDER — LORAZEPAM 1 MG PO TABS
2.0000 mg | ORAL_TABLET | Freq: Once | ORAL | Status: AC
  Administered 2020-04-10: 2 mg via ORAL
  Filled 2020-04-10: qty 2

## 2020-04-10 MED ORDER — PROPRANOLOL HCL 20 MG PO TABS
10.0000 mg | ORAL_TABLET | Freq: Two times a day (BID) | ORAL | Status: DC
  Administered 2020-04-10: 10 mg via ORAL
  Filled 2020-04-10 (×2): qty 1

## 2020-04-10 MED ORDER — VITAMIN B-12 1000 MCG PO TABS
1000.0000 ug | ORAL_TABLET | Freq: Every day | ORAL | Status: DC
  Administered 2020-04-10: 1000 ug via ORAL
  Filled 2020-04-10: qty 1

## 2020-04-10 MED ORDER — LITHIUM CARBONATE ER 450 MG PO TBCR
900.0000 mg | EXTENDED_RELEASE_TABLET | Freq: Every day | ORAL | Status: DC
  Administered 2020-04-10: 900 mg via ORAL
  Filled 2020-04-10: qty 2

## 2020-04-10 NOTE — ED Provider Notes (Signed)
[facility] DEPT Provider Note   CSN: [id number] Arrival date & time: 04/10/20  1319     History Chief Complaint  Patient presents with  . Suicidal    [name] is a 29 y.o. female.  Patient is a 29 year old female with a history of bipolar disease, schizophrenia, hypertension and diabetes who presents today with complaint of worsening depression, anxiety and suicidal thoughts.  Patient reports that she recently got out of a mental hospital in [place] and came home feeling worse than when she went in.  She reports she does not think her medications are working.  She feels very sad and depressed and also reports she does not feel like anybody in her family cares.  She has been taking her medication but does not feel like it is working.  She has been feeling more more suicidal thoughts and reports that she tried to overdose on her diabetes and depression medicine last week but it did not work and she did not call anyone.  She has not tried to hurt her self today but reports that she wants help.  She denies any cough, congestion, fever.  She is complaining of palpitations at this time but thinks it is related to her nerves.  The history is provided by the patient.       Past Medical History:  Diagnosis Date  . Anxiety   . Bipolar 1 disorder ([place])   . Cognitive deficits   . Depression   . Diabetes mellitus without complication ([place])   . Hypertension   . Mental disorder   . Mental health disorder   . Obesity     Patient Active Problem List   Diagnosis Date Noted  . Anxiety state 03/06/2020  . Schizophrenia ([place]) 09/13/2019  . Bipolar 1 disorder ([place]) 06/23/2019  . MDD (major depressive disorder) 10/10/2018  . Schizoaffective disorder, bipolar type ([place]) 09/25/2018  . Bipolar I disorder ([place]) 06/13/2018  . HTN (hypertension) 05/03/2018  . Tobacco use disorder 05/03/2018  . Bipolar I disorder, most recent episode depressed, severe without  psychotic features ([place]) 05/02/2018  . Adjustment disorder with emotional disturbance 01/02/2018  . 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 Moderate bipolar I disorder, most recent episode depressed ([place])   . Psychosis ([place])   . 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 OCD (obsessive compulsive disorder) 10/05/2016  . Major depressive disorder, recurrent episode, mild ([place]) 05/04/2016  . 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 Learning disability 07/18/2015  . 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 Diabetes mellitus ([place]) 07/18/2015  . 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 Hyperlipidemia 07/18/2015  . Severe episode of recurrent major depressive disorder, without psychotic features ([place])   . Suicidal ideation   . Drug overdose   . Cognitive deficits 10/12/2012  . Generalized anxiety disorder 06/28/2012    Past Surgical History:  Procedure Laterality Date  . CESAREAN SECTION    . CESAREAN SECTION N/A 04/25/2013   Procedure: REPEAT CESAREAN SECTION;  Surgeon: [name], MD;  Location: [facility];  Service: Obstetrics;  Laterality: N/A;  . MASS EXCISION N/A 06/03/2012   Procedure: EXCISION MASS;  Surgeon: [name], MD;  Location: [facility];  Service: ENT;  Laterality: N/A;  Excision uvula mass  . TONSILLECTOMY N/A 06/03/2012   Procedure: TONSILLECTOMY;  Surgeon: [name], MD;  Location: [facility];  Service: ENT;  Laterality:  N/A;  . TONSILLECTOMY       OB History    Gravida  3   Para  3   Term  3   Preterm  0   AB  0   Living  3     SAB  0   IAB  0   Ectopic  0   Multiple      Live Births  3           Family History  Problem Relation Age of Onset  . Hypertension Mother   . Diabetes Father     Social History   Tobacco Use  . Smoking status: Current Every Day Smoker     Packs/day: 4.00    Years: 11.00    Pack years: 44.00    Types: Cigarettes  . Smokeless tobacco: Never Used  . Tobacco comment: Pt declined  Vaping Use  . Vaping Use: Never used  Substance Use Topics  . Alcohol use: Not Currently    Comment: occ: last intake 4 mts ago  . Drug use: Not Currently    Types: "Crack" cocaine, Other-see comments    Comment: Patient reports hx of smoking Crack    Home Medications Prior to Admission medications   Medication Sig Start Date End Date Taking? Authorizing Provider  ARIPiprazole ER (ABILIFY MAINTENA) 400 MG SRER injection Inject 2 mLs (400 mg total) into the muscle every 28 (twenty-eight) days. 06/28/19   Johnn Hai, MD  BANOPHEN 25 MG capsule Take 25 mg by mouth at bedtime. 10/19/19   [provider]  gabapentin (NEURONTIN) 300 MG capsule Take 300 mg by mouth 3 (three) times daily as needed.  08/21/19   [provider]  insulin detemir (LEVEMIR) 100 UNIT/ML injection Inject 0.4 mLs (40 Units total) into the skin 2 (two) times daily. 03/08/20   Money, Lowry Ram, FNP  lamoTRIgine (LAMICTAL) 100 MG tablet Take 100 mg by mouth daily. 08/21/19   [provider]  LEVEMIR FLEXTOUCH 100 UNIT/ML FlexPen Inject 40 Units into the skin 2 (two) times daily.  08/09/19   [provider]  lithium carbonate (ESKALITH) 450 MG CR tablet Take 2 tablets (900 mg total) by mouth at bedtime. 06/02/19   Johnn Hai, MD  perphenazine (TRILAFON) 2 MG tablet Take 6 mg by mouth 2 (two) times daily.    [provider]  propranolol (INDERAL) 10 MG tablet Take 1 tablet (10 mg total) by mouth 2 (two) times daily. 06/02/19   Johnn Hai, MD  simvastatin (ZOCOR) 20 MG tablet Take 20 mg by mouth at bedtime. 08/21/19   [provider]  vitamin B-12 (CYANOCOBALAMIN) 1000 MCG tablet Take 1,000 mcg by mouth daily. 11/13/19   [provider]    Allergies    Wellbutrin [bupropion], Omnipaque [iohexol], Penicillins, Cogentin  [benztropine], Depakote er [divalproex sodium er], Divalproex sodium, and Iohexol  Review of Systems   Review of Systems  All other systems reviewed and are negative.   Physical Exam Updated Vital Signs BP (!) 141/94 (BP Location: Left Arm)   Pulse 80   Temp 98.8 F (37.1 C) (Oral)   Resp 16   LMP 04/10/2020 (Approximate)   SpO2 98%   Physical Exam Vitals and nursing note reviewed.  Constitutional:      Appearance: She is well-developed and well-nourished. She is obese.     Comments: Slightly disheveled and anxious appearing  HENT:     Head: Normocephalic and atraumatic.  Eyes:  Extraocular Movements: EOM normal.     Pupils: Pupils are equal, round, and reactive to light.  Cardiovascular:     Rate and Rhythm: Normal rate and regular rhythm.     Pulses: Intact distal pulses.     Heart sounds: Normal heart sounds. No murmur heard. No friction rub.  Pulmonary:     Effort: Pulmonary effort is normal.     Breath sounds: Normal breath sounds. No wheezing or rales.  Abdominal:     General: Bowel sounds are normal. There is no distension.     Palpations: Abdomen is soft.     Tenderness: There is no abdominal tenderness. There is no guarding or rebound.  Musculoskeletal:        General: No tenderness. Normal range of motion.     Cervical back: Normal range of motion.     Comments: No edema  Skin:    General: Skin is warm and dry.     Findings: No rash.  Neurological:     General: No focal deficit present.     Mental Status: She is alert and oriented to person, place, and time. Mental status is at baseline.     Cranial Nerves: No cranial nerve deficit.  Psychiatric:        Attention and Perception: She does not perceive auditory hallucinations.        Mood and Affect: Mood is anxious.        Behavior: Behavior is agitated. Behavior is cooperative.        Thought Content: Thought content includes suicidal ideation. Thought content includes suicidal plan.     ED  Results / Procedures / Treatments   Labs (all labs ordered are listed, but only abnormal results are displayed) Labs Reviewed  COMPREHENSIVE METABOLIC PANEL - Abnormal; Notable for the following components:      Result Value   Alkaline Phosphatase 36 (*)    All other components within normal limits  SALICYLATE LEVEL - Abnormal; Notable for the following components:   Salicylate Lvl <4.7 (*)    All other components within normal limits  ACETAMINOPHEN LEVEL - Abnormal; Notable for the following components:   Acetaminophen (Tylenol), Serum <10 (*)    All other components within normal limits  CBC - Abnormal; Notable for the following components:   Hemoglobin 11.0 (*)    HCT 35.2 (*)    MCH 25.9 (*)    All other components within normal limits  RESP PANEL BY RT-PCR (FLU A&B, COVID) ARPGX2  ETHANOL  RAPID URINE DRUG SCREEN, HOSP PERFORMED  I-STAT BETA HCG BLOOD, ED ([facility], [facility], [facility] ONLY)  I-STAT BETA HCG BLOOD, ED ([facility], [facility], [facility] ONLY)    EKG None  Radiology No results found.  Procedures Procedures (including critical care time)  Medications Ordered in ED Medications  LORazepam (ATIVAN) tablet 2 mg (has no administration in time range)    ED Course  I have reviewed the triage vital signs and the nursing notes.  Pertinent labs & imaging results that were available during my care of the patient were reviewed by me and considered in my medical decision making (see chart for details).    MDM Rules/Calculators/A&P                          Patient presenting today with a history of mental illness complaining of worsening depression, suicidal thoughts and anxiety.  Patient denies any substance use but did try to overdose last week  on diabetic meds and depression meds.  She denies currently trying to hurt herself today.  She is tearful on exam but denies any auditory or visual hallucination.  She is slightly agitated but at this time is cooperative and voluntary.  Vital signs are  reassuring.  Labs are pending.  TTS to evaluate.  3:11 PM Labs wnl.  Pt clear for psych.  Final Clinical Impression(s) / ED Diagnoses Final diagnoses:  Suicidal ideation    Rx / DC Orders ED Discharge Orders    None       Blanchie Dessert, MD 04/10/20 1513

## 2020-04-10 NOTE — ED Notes (Signed)
Pt given food. Pt had a silver pair of hoop earrings, placed in pt belongings bag.

## 2020-04-10 NOTE — ED Notes (Signed)
Pt changed into burgandy scrubs and wanded by security.  Belongings included one pt belonging bag with clothes and her black purse.  Both placed under ice machine in cabinet at triage nurses station.

## 2020-04-10 NOTE — ED Triage Notes (Addendum)
Pt presents with c/o suicidal ideation. Per EMS, [employer] was called to scene and then pt had a panic attack and vomited. Pt reporting that she is suicidal as her depression has gotten worse over the last 2 weeks with an attempted overdose in the past 2 weeks. When asked about HI, pt says "I can't really say." Pt is very tearful in triage.

## 2020-04-10 NOTE — ED Notes (Signed)
Patient calm and cooperative at this time. Patient taking medication without difficulty.

## 2020-04-10 NOTE — BH Assessment (Signed)
Comprehensive Clinical Assessment (CCA) Note  04/10/2020 [name] [id number]  Visit Diagnosis: Bipolar I Disposition: Stacy Newport, NP recommends [place] [facility] for safety and stablization  [name] is a 29 yo single female who presents voluntarily to [facility]. Pt is reporting symptoms of depression with suicidal ideation. Pt has a history of Bipolar I dx and multiple psychiatric admissions. Pt reports medication compliance. She reports current suicidal ideation with plans of overdosing on her medications. Past attempts include "about 7".   Pt acknowledges multiple symptoms of Depression, including isolating, feelings of worthlessness, tearfulness, decreased sleep & appetite, & increased irritability. Pt denies homicidal ideation/ history of violence. Pt denies auditory & visual hallucinations & other symptoms of psychosis. Pt states current stressors include strained family relationships and nightmares.   Pt lives alone in an apt. She states she would rather live with people in a group home. Pt reports she upset and lost a few friends recently and has less support. Pt denies hx of abuse and trauma.  Pt has partial insight and judgment. Pt's memory is intact.   Protective factors against suicide include therapeutic relationship, & no current psychotic symptoms.?  Pt's OP history includes [facility]. IP history includes multiple. Last admission was at [facility]- discharged about 2 weeks ago.  Pt denies alcohol/ substance abuse. ? MSE: Pt is dressed in scrubs, alert, oriented x 5 with normal speech and normal motor behavior. Eye contact is good. Pt's mood is depressed and affect is constricted. Affect is congruent with mood. Thought process is coherent and relevant. There is no indication pt is currently responding to internal stimuli or experiencing delusional thought content. Pt was cooperative throughout assessment.    Chief Complaint:  Chief Complaint  Patient  presents with   Suicidal      CCA Screening, Triage and Referral (STR)  Patient Reported Information How did you hear about [place]? Self  Referral name: Came from the [facility]  Referral phone number: No data recorded  Whom do you see for routine medical problems? Primary Care  Practice/Facility Name: [facility]  What Is the Reason for Your Visit/Call Today? Family turned their back on me; Medication not working- keep crying & arguing with friends  How Long Has This Been Causing You Problems? 1 wk - 1 month  What Do You Feel Would Help You the Most Today? Other (Comment) (inpt tx)   Have You Recently Been in Any Inpatient Treatment (Hospital/Detox/Crisis Center/28-Day Program)? Yes  Name/Location of Program/Hospital:[facility]  How Long Were You There? 4-5 days  When Were You Discharged?  (2 weeks ago- not sure of date)   Have You Ever Received Services From [employer] Before? Yes  Who Do You See at [facility]? [facility], Inpatient Treatment   Have You Recently Had Any Thoughts About Hurting Yourself? Yes  Are You Planning to Commit Suicide/Harm Yourself At This time? Yes   Have you Recently Had Thoughts About Hurting Someone [name]? No  Have You Used Any Alcohol or Drugs in the Past 24 Hours? No  Do You Currently Have a Therapist/Psychiatrist? Yes  Name of Therapist/Psychiatrist: [facility]   Have You Been Recently Discharged From Any Office Practice or Programs? No    CCA Screening Triage Referral Assessment Type of Contact: Tele-Assessment  Is this Initial or Reassessment? Initial Assessment  Date Telepsych consult ordered in [facility]:  04/10/2020  Time Telepsych consult ordered in [facility]:  1728   Patient Reported Information Reviewed? Yes  Reason for  Not Completing Assessment: pt unable to be assessed currently in hallway   Collateral Involvement: pt declines family collateral contact and [facility]  Name and Contact of Legal Guardian: Self.   If Minor  and Not Living with Parent(s), Who has Custody? Self  Is CPS involved or ever been involved? In the Past (children were adopted)  Is APS involved or ever been involved? Never   Patient Determined To Be At Risk for Harm To Self or Others Based on Review of Patient Reported Information or Presenting Complaint? Yes, for Self-Harm   Contacted To Inform of Risk of Harm To Self or Others: -- (TTS to attempt contact with [facility])   [facility]   Does Patient Present under Involuntary Commitment? No   [place] of Residence: [place]   Patient Currently Receiving the Following Services: Medication Management   Determination of Need: Emergent (2 hours)   Options For Referral: Medication Management; Outpatient Therapy; Partial Hospitalization; Inpatient Hospitalization     CCA Biopsychosocial Intake/Chief Complaint:  nightmares; not getting along with family or friends and having SI with plan to OD  Current Symptoms/Problems: Pt reports a roommate is "bossing me around" and her boyfriend is avoiding her calls.   Patient Reported Schizophrenia/Schizoaffective Diagnosis in Past: Yes   Strengths: Able to communicate needs.  Preferences: pt would rather live in a group home to be around other people  Abilities: No data recorded  Type of Services Patient Feels are Needed: Housing   Initial Clinical Notes/Concerns: patient with significant history in ED and [facility]; currently followed by [facility] and taking medications as prescribed.   Mental Health Symptoms Depression:  Change in energy/activity; Hopelessness; Increase/decrease in appetite; Irritability; Sleep (too much or little); Weight gain/loss; Tearfulness; Worthlessness   Duration of Depressive symptoms: Greater than two weeks   Mania:  Irritability; Change in energy/activity; Increased Energy; Racing thoughts; Recklessness   Anxiety:   Irritability; Worrying; Restlessness   Psychosis:  None   Duration  of Psychotic symptoms: No data recorded  Trauma:  None   Obsessions:  N/A   Compulsions:  N/A   Inattention:  Does not follow instructions (not oppositional)   Hyperactivity/Impulsivity:  N/A   Oppositional/Defiant Behaviors:  N/A   Emotional Irregularity:  Mood lability   Other Mood/Personality Symptoms:  NA    Mental Status Exam Appearance and self-care  Stature:  Average   Weight:  Overweight   Clothing:  -- (scrubs)   Grooming:  Normal   Cosmetic use:  Age appropriate   Posture/gait:  Normal   Motor activity:  Not Remarkable   Sensorium  Attention:  Normal   Concentration:  Normal   Orientation:  X5   Recall/memory:  Normal   Affect and Mood  Affect:  Constricted; Depressed   Mood:  Depressed   Relating  Eye contact:  Normal   Facial expression:  Constricted   Attitude toward examiner:  Cooperative   Thought and Language  Speech flow: Loud; Clear and Coherent   Thought content:  Appropriate to Mood and Circumstances   Preoccupation:  None   Hallucinations:  None   Organization:  No data recorded  [employer] of Knowledge:  Average   Intelligence:  Average   Abstraction:  Normal   Judgement:  Fair   Reality Testing:  Adequate   Insight:  Fair   Decision Making:  Vacilates   Social Functioning  Social Maturity:  Isolates   Social Judgement:  Victimized  Stress  Stressors:  Housing; Relationship (family relationships)   Coping Ability:  Exhausted   Skill Deficits:  Decision making   Supports:  Other (Comment); Family     Religion: Religion/Spirituality Are You A Religious Person?: Yes What is Your Religious Affiliation?: Christian  Leisure/Recreation: Leisure / Recreation Do You Have Hobbies?: No  Exercise/Diet: Exercise/Diet Do You Exercise?: Yes What Type of Exercise Do You Do?: Run/Walk How Many Times a Week Do You Exercise?: 6-7 times a week Have You Gained or Lost A Significant Amount of  Weight in the Past Six Months?: No Do You Follow a Special Diet?: No Do You Have Any Trouble Sleeping?: Yes Explanation of Sleeping Difficulties: interrupted frequently; nightmares   CCA Employment/Education Employment/Work Situation: Employment / Work Situation Employment situation: On disability Why is patient on disability: Bipolar I How long has patient been on disability: UTA What is the longest time patient has a held a job?: UTA Where was the patient employed at that time?: [employer] Has patient ever been in the [employer]?: No  Education: Education Name of [employer]: no   CCA Family/Childhood History Family and Relationship History: Family history Marital status: Single What is your sexual orientation?: Heterosexuals Does patient have children?: Yes How many children?: 3 How is patient's relationship with their children?: Three daughters, ages 57, 19, 53, who are in foster care and adoption process  Childhood History:  Childhood History By whom was/is the patient raised?: Mother Additional childhood history information: Patient reports "I only seen him (father) about three times in my life and now he don't want nothing to do with me." Description of patient's relationship with caregiver when they were a child: Patient reports "it was good, she raised me good".  How were you disciplined when you got in trouble as a child/adolescent?: Patient reports "nothing". Does patient have siblings?: Yes Number of Siblings: 2 Description of patient's current relationship with siblings: Patient reports "I have two sisters, one I get along with one of them.  The other I don't hear from that much.  Ever since she married that crazy boy." Did patient suffer any verbal/emotional/physical/sexual abuse as a child?: No Did patient suffer from severe childhood neglect?: No Has patient ever been sexually abused/assaulted/raped as an adolescent or adult?: No Was the patient ever a victim of a  crime or a disaster?: No Witnessed domestic violence?: No Has patient been affected by domestic violence as an adult?: No    CCA Substance Use Alcohol/Drug Use: Alcohol / Drug Use Pain Medications: None Prescriptions: Lithium, Haldol injection, & Lamictal reported See MAR for other meds Over the Counter: Vitamins History of alcohol / drug use?: No history of alcohol / drug abuse Longest period of sobriety (when/how long): NA      DSM5 Diagnoses: Patient Active Problem List   Diagnosis Date Noted   Anxiety state 03/06/2020   Schizophrenia ([place]) 09/13/2019   Bipolar 1 disorder ([place]) 06/23/2019   MDD (major depressive disorder) 10/10/2018   Schizoaffective disorder, bipolar type ([place]) 09/25/2018   Bipolar I disorder ([employer]) 06/13/2018   HTN (hypertension) 05/03/2018   Tobacco use disorder 05/03/2018   Bipolar I disorder, most recent episode depressed, severe without psychotic features ([place]) 05/02/2018   Adjustment disorder with emotional disturbance 01/02/2018   Schizophrenia, 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   Moderate bipolar I disorder, most recent episode depressed ([place])    Psychosis ([place])    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/2017   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  OCD (obsessive compulsive disorder) 10/05/2016   Major depressive disorder, recurrent episode, mild ([place]) 05/04/2016   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

## 2020-04-11 ENCOUNTER — Other Ambulatory Visit: Payer: Self-pay

## 2020-04-11 ENCOUNTER — Ambulatory Visit (HOSPITAL_COMMUNITY)
Admission: EM | Admit: 2020-04-11 | Discharge: 2020-04-11 | Disposition: A | Discharge status: Home / Self Care | Payer: Medicaid Other | Attending: Student | Admitting: Student

## 2020-04-11 ENCOUNTER — Encounter (HOSPITAL_COMMUNITY): Payer: Self-pay | Admitting: Emergency Medicine

## 2020-04-11 DIAGNOSIS — R45851 Suicidal ideations: Secondary | ICD-10-CM

## 2020-04-11 DIAGNOSIS — F4325 Adjustment disorder with mixed disturbance of emotions and conduct: Secondary | ICD-10-CM

## 2020-04-11 DIAGNOSIS — F250 Schizoaffective disorder, bipolar type: Secondary | ICD-10-CM

## 2020-04-11 LAB — GLUCOSE, CAPILLARY
Glucose-Capillary: 116 mg/dL — ABNORMAL HIGH (ref 70–99)
Glucose-Capillary: 133 mg/dL — ABNORMAL HIGH (ref 70–99)

## 2020-04-11 MED ORDER — GABAPENTIN 300 MG PO CAPS: 300.0000 mg | ORAL_CAPSULE | Freq: Three times a day (TID) | ORAL | Status: DC | PRN

## 2020-04-11 MED ORDER — VITAMIN B-12 1000 MCG PO TABS
1000.0000 ug | ORAL_TABLET | Freq: Every day | ORAL | Status: DC
  Administered 2020-04-11: 1000 ug via ORAL
  Filled 2020-04-11: qty 1

## 2020-04-11 MED ORDER — INSULIN DETEMIR 100 UNIT/ML ~~LOC~~ SOLN
40.0000 [IU] | Freq: Two times a day (BID) | SUBCUTANEOUS | Status: DC
  Administered 2020-04-11: 40 [IU] via SUBCUTANEOUS

## 2020-04-11 MED ORDER — ACETAMINOPHEN 325 MG PO TABS: 650.0000 mg | ORAL_TABLET | Freq: Four times a day (QID) | ORAL | Status: DC | PRN

## 2020-04-11 MED ORDER — PERPHENAZINE 2 MG PO TABS: 6.0000 mg | ORAL_TABLET | Freq: Two times a day (BID) | ORAL | Status: DC

## 2020-04-11 MED ORDER — SIMVASTATIN 20 MG PO TABS: 20.0000 mg | ORAL_TABLET | Freq: Every day | ORAL | Status: DC

## 2020-04-11 MED ORDER — PERPHENAZINE 4 MG PO TABS
6.0000 mg | ORAL_TABLET | Freq: Two times a day (BID) | ORAL | Status: DC
  Administered 2020-04-11: 6 mg via ORAL
  Filled 2020-04-11 (×2): qty 1

## 2020-04-11 MED ORDER — ALUM & MAG HYDROXIDE-SIMETH 200-200-20 MG/5ML PO SUSP: 30.0000 mL | ORAL | Status: DC | PRN

## 2020-04-11 MED ORDER — LAMOTRIGINE 100 MG PO TABS
100.0000 mg | ORAL_TABLET | Freq: Every day | ORAL | Status: DC
Start: 2020-04-11 — End: 2020-04-11
  Administered 2020-04-11: 100 mg via ORAL
  Filled 2020-04-11: qty 1

## 2020-04-11 MED ORDER — PROPRANOLOL HCL 10 MG PO TABS
10.0000 mg | ORAL_TABLET | Freq: Two times a day (BID) | ORAL | Status: DC
  Administered 2020-04-11: 10 mg via ORAL
  Filled 2020-04-11 (×2): qty 1

## 2020-04-11 MED ORDER — DIPHENHYDRAMINE HCL 25 MG PO CAPS: 25.0000 mg | ORAL_CAPSULE | Freq: Every day | ORAL | Status: DC

## 2020-04-11 MED ORDER — MAGNESIUM HYDROXIDE 400 MG/5ML PO SUSP: 30.0000 mL | Freq: Every day | ORAL | Status: DC | PRN

## 2020-04-11 MED ORDER — LITHIUM CARBONATE ER 450 MG PO TBCR: 900.0000 mg | EXTENDED_RELEASE_TABLET | Freq: Every day | ORAL | Status: DC

## 2020-04-11 MED ORDER — AMLODIPINE BESYLATE 5 MG PO TABS
5.0000 mg | ORAL_TABLET | Freq: Every day | ORAL | Status: DC
  Administered 2020-04-11: 5 mg via ORAL
  Filled 2020-04-11: qty 1

## 2020-04-11 MED ORDER — HYDROXYZINE HCL 25 MG PO TABS: 25.0000 mg | ORAL_TABLET | Freq: Three times a day (TID) | ORAL | Status: DC | PRN

## 2020-04-11 NOTE — ED Provider Notes (Signed)
FBC/OBS ASAP Discharge Summary  Date and Time: 04/11/2020 11:18 AM  Name: [name]  MRN:  [id number]   Discharge Diagnoses:  Final diagnoses:  Suicidal ideation  Adjustment disorder with mixed disturbance of emotions and conduct  Schizoaffective disorder, bipolar type ([place])    Subjective: Patient reports this morning that she is doing better.  She denies any suicidal homicidal ideations and denies any hallucinations.  Patient reports that she was staying at the [facility] rescue mission and really liked it there but she was having issues with 2 other females and had to leave.  She reports that she still has her own apartment but does not like staying there because of the environment in the neighborhood that she is seeing and she is wanting to stay somewhere else but her ACT team is not helping her out with that.  She also reports that she is interested in a different ACT team as well as possibly getting a payee and guardian.  Patient is informed and educated on how to go to [facility] to assist with payee and guardianship patient is also provided with information on the other available ACT teams in the [facility] area and that with her being her own legal guardian to this time then she would have to contact these facilities herself and do the transfer process.  Patient stated understanding and agreement and requested information to be written out for her so she knows what to say to people.  Stay Summary: Patient is a 29 year old female presented to the [facility] as a direct admit from [place] along the ED with a history of bipolar disorder, schizophrenia, MDD, and schizoaffective disorder.  Patient reported that she was having issues with her mother and father and that she does not like her housing situation, and does not like her ACT team.  Patient was reported was admitted to the continuous observation unit for overnight assessment.  Patient was restarted on her home medications.  Patient this  morning was denying any suicidal homicidal ideations but was continuing to have concerns with all of the above.  Patient was educated on how to transfer ACT teams in the [facility] area and provided with information.  Patient was also informed to follow-up with DSS so that she could possibly have a guardian and a payee.  Patient was informed that we were right next door to DSS and she opted to walk over to the DSS building to get the assistance that she needed.  Patient was instructed to continue her home medications and to follow-up with her acting to ensure that she has medications to continue.  Patient was discharged home with directions to DSS as well as being provided with assistance to get home after she follows up with DSS.  Patient continued to deny any suicidal homicidal ideations and denies any hallucinations  Total Time spent with patient: 30 minutes  Past Psychiatric History: Anxiety, bipolar 1 disorder, MDD, schizoaffective disorder, numerous hospitalizations and ED visits, current with ACT team Past Medical History:  Past Medical History:  Diagnosis Date  . Anxiety   . Bipolar 1 disorder ([place])   . Cognitive deficits   . Depression   . Diabetes mellitus without complication ([place])   . Hypertension   . Mental disorder   . Mental health disorder   . Obesity     Past Surgical History:  Procedure Laterality Date  . CESAREAN SECTION    . CESAREAN SECTION N/A 04/25/2013   Procedure: REPEAT CESAREAN  SECTION;  Surgeon: [name], MD;  Location: [facility];  Service: Obstetrics;  Laterality: N/A;  . MASS EXCISION N/A 06/03/2012   Procedure: EXCISION MASS;  Surgeon: [name], MD;  Location: [facility];  Service: ENT;  Laterality: N/A;  Excision uvula mass  . TONSILLECTOMY N/A 06/03/2012   Procedure: TONSILLECTOMY;  Surgeon: [name], MD;  Location: [facility];  Service: ENT;  Laterality: N/A;  . TONSILLECTOMY     Family History:  Family  History  Problem Relation Age of Onset  . Hypertension Mother   . Diabetes Father    Family Psychiatric History: None reported Social History:  Social History   Substance and Sexual Activity  Alcohol Use Not Currently   Comment: occ: last intake 4 mts ago     Social History   Substance and Sexual Activity  Drug Use Not Currently  . Types: "Crack" cocaine, Other-see comments   Comment: Patient reports hx of smoking Crack    Social History   Socioeconomic History  . Marital status: Single    Spouse name: Not on file  . Number of children: Not on file  . Years of education: Not on file  . Highest education level: Not on file  Occupational History  . Not on file  Tobacco Use  . Smoking status: Current Every Day Smoker    Packs/day: 4.00    Years: 11.00    Pack years: 44.00    Types: Cigarettes  . Smokeless tobacco: Never Used  . Tobacco comment: Pt declined  Vaping Use  . Vaping Use: Never used  Substance and Sexual Activity  . Alcohol use: Not Currently    Comment: occ: last intake 4 mts ago  . Drug use: Not Currently    Types: "Crack" cocaine, Other-see comments    Comment: Patient reports hx of smoking Crack  . Sexual activity: Yes    Birth control/protection: Implant  Other Topics Concern  . Not on file  Social History Narrative   ** Merged History Encounter **       Social Determinants of Health   Financial Resource Strain: Not on file  Food Insecurity: Not on file  Transportation Needs: Not on file  Physical Activity: Not on file  Stress: Not on file  Social Connections: Not on file   SDOH:  SDOH Screenings   Alcohol Screen: Low Risk   . Last Alcohol Screening Score (AUDIT): 0  Depression (PHQ2-9): Medium Risk  . PHQ-2 Score: 15  Financial Resource Strain: Not on file  Food Insecurity: Not on file  Housing: Not on file  Physical Activity: Not on file  Social Connections: Not on file  Stress: Not on file  Tobacco Use: High Risk  . Smoking  Tobacco Use: Current Every Day Smoker  . Smokeless Tobacco Use: Never Used  Transportation Needs: Not on file    Has this patient used any form of tobacco in the last 30 days? (Cigarettes, Smokeless Tobacco, Cigars, and/or Pipes) A prescription for an FDA-approved tobacco cessation medication was offered at discharge and the patient refused  Current Medications:  Current Facility-Administered Medications  Medication Dose Route Frequency Provider Last Rate Last Admin  . acetaminophen (TYLENOL) tablet 650 mg  650 mg Oral Q6H PRN Prescilla Sours, PA-C      . alum & mag hydroxide-simeth (MAALOX/MYLANTA) 200-200-20 MG/5ML suspension 30 mL  30 mL Oral Q4H PRN [name] W, PA-C      . amLODipine (NORVASC) tablet 5  mg  5 mg Oral Daily Prescilla Sours, PA-C   5 mg at 04/11/20 9758  . diphenhydrAMINE (BENADRYL) capsule 25 mg  25 mg Oral QHS [name] W, PA-C      . gabapentin (NEURONTIN) capsule 300 mg  300 mg Oral TID PRN Prescilla Sours, PA-C      . hydrOXYzine (ATARAX/VISTARIL) tablet 25 mg  25 mg Oral TID PRN Prescilla Sours, PA-C      . insulin detemir (LEVEMIR) injection 40 Units  40 Units Subcutaneous BID Prescilla Sours, PA-C   40 Units at 04/11/20 0929  . lamoTRIgine (LAMICTAL) tablet 100 mg  100 mg Oral Daily [name] W, PA-C   100 mg at 04/11/20 8325  . lithium carbonate (ESKALITH) CR tablet 900 mg  900 mg Oral QHS [name] W, PA-C      . magnesium hydroxide (MILK OF MAGNESIA) suspension 30 mL  30 mL Oral Daily PRN [name] W, PA-C      . perphenazine (TRILAFON) tablet 6 mg  6 mg Oral BID [name] W, PA-C   6 mg at 04/11/20 1012  . propranolol (INDERAL) tablet 10 mg  10 mg Oral BID Prescilla Sours, PA-C   10 mg at 04/11/20 4982  . simvastatin (ZOCOR) tablet 20 mg  20 mg Oral QHS [name] W, PA-C      . vitamin B-12 (CYANOCOBALAMIN) tablet 1,000 mcg  1,000 mcg Oral Daily Prescilla Sours, PA-C   1,000 mcg at 04/11/20 6415   Current Outpatient Medications  Medication Sig  Dispense Refill  . amLODipine (NORVASC) 5 MG tablet Take 5 mg by mouth daily.    . ARIPiprazole ER (ABILIFY MAINTENA) 400 MG SRER injection Inject 2 mLs (400 mg total) into the muscle every 28 (twenty-eight) days. 1 each 11  . haloperidol decanoate (HALDOL DECANOATE) 50 MG/ML injection Inject 50 mg into the muscle every 28 (twenty-eight) days.    . insulin detemir (LEVEMIR) 100 UNIT/ML injection Inject 0.4 mLs (40 Units total) into the skin 2 (two) times daily. (Patient not taking: Reported on 04/10/2020) 10 mL 1  . lamoTRIgine (LAMICTAL) 100 MG tablet Take 100 mg by mouth daily.    Marland Kitchen LEVEMIR FLEXTOUCH 100 UNIT/ML FlexPen Inject 40 Units into the skin 2 (two) times daily.     Marland Kitchen lithium carbonate (ESKALITH) 450 MG CR tablet Take 2 tablets (900 mg total) by mouth at bedtime. 60 tablet 2  . perphenazine (TRILAFON) 2 MG tablet Take 3 tablets (6 mg total) by mouth 2 (two) times daily.    . propranolol (INDERAL) 10 MG tablet Take 1 tablet (10 mg total) by mouth 2 (two) times daily. 60 tablet 2  . simvastatin (ZOCOR) 20 MG tablet Take 20 mg by mouth at bedtime.    . vitamin B-12 (CYANOCOBALAMIN) 1000 MCG tablet Take 1,000 mcg by mouth daily.      PTA Medications: (Not in a hospital admission)   Musculoskeletal  Strength & Muscle Tone: within normal limits Gait & Station: normal Patient leans: N/A  Psychiatric Specialty Exam  Presentation  General Appearance: Casual  Eye Contact:Good  Speech:Clear and Coherent; Normal Rate  Speech Volume:Normal  Handedness:Right   Mood and Affect  Mood:Anxious; Euthymic  Affect:Appropriate; Congruent   Thought Process  Thought Processes:Coherent  Descriptions of Associations:Intact  Orientation:Full (Time, Place and Person)  Thought Content:WDL  Hallucinations:Hallucinations: None  Ideas of Reference:None  Suicidal Thoughts:Suicidal Thoughts: No SI Active Intent and/or Plan: With Plan; With  Intent; With Means to Carry Out; With Access  to Means (Patient states she has SI with plan to overdose on her medications.)  Homicidal Thoughts:Homicidal Thoughts: No   Sensorium  Memory:Immediate Good; Recent Good; Remote Good  Judgment:Fair  Insight:Fair   Executive Functions  Concentration:Good  Attention Span:Good  Be[place] of Knowledge:Good  Language:Good   Psychomotor Activity  Psychomotor Activity:Psychomotor Activity: Normal   Assets  Assets:Communication Skills; Desire for Improvement; Financial Resources/Insurance; Housing; Social Support   Sleep  Sleep:Sleep: Fair Number of Hours of Sleep: 5   Physical Exam  Physical Exam Vitals and nursing note reviewed.  Constitutional:      Appearance: She is well-developed.  HENT:     Head: Normocephalic.  Eyes:     Pupils: Pupils are equal, round, and reactive to light.  Cardiovascular:     Rate and Rhythm: Normal rate.  Pulmonary:     Effort: Pulmonary effort is normal.  Musculoskeletal:        General: Normal range of motion.  Neurological:     Mental Status: She is alert and oriented to person, place, and time.    Review of Systems  Constitutional: Negative.   HENT: Negative.   Eyes: Negative.   Respiratory: Negative.   Cardiovascular: Negative.   Gastrointestinal: Negative.   Genitourinary: Negative.   Musculoskeletal: Negative.   Skin: Negative.   Neurological: Negative.   Endo/Heme/Allergies: Negative.   Psychiatric/Behavioral: Negative.    Blood pressure (!) 122/97, pulse 83, temperature 98 F (36.7 C), temperature source Oral, resp. rate 18, last menstrual period 04/10/2020, SpO2 99 %. There is no height or weight on file to calculate BMI.  Demographic Factors:  Low socioeconomic status and Unemployed  Loss Factors: NA  Historical Factors: Impulsivity  Risk Reduction Factors:   Religious beliefs about death, Positive social support, Positive therapeutic relationship and Positive coping skills or problem solving  skills  Continued Clinical Symptoms:  Previous Psychiatric Diagnoses and Treatments  Cognitive Features That Contribute To Risk:  None    Suicide Risk:  Mild:  Suicidal ideation of limited frequency, intensity, duration, and specificity.  There are no identifiable plans, no associated intent, mild dysphoria and related symptoms, good self-control (both objective and subjective assessment), few other risk factors, and identifiable protective factors, including available and accessible social support.  Plan Of Care/Follow-up recommendations:  Continue activity as tolerated. Continue diet as recommended by your PCP. Ensure to keep all appointments with outpatient providers.  Disposition: Discharge home to follow up with [facility]  Lewis Shock, FNP 04/11/2020, 11:18 AM

## 2020-04-11 NOTE — ED Notes (Signed)
Given muffin

## 2020-04-11 NOTE — ED Notes (Signed)
Patient discharged home. AVS reviewed with patient and written copy given to patient. All belongings returned to patient. Patient denies SI/HI. Patient escorted off unit by staff to meet safe transport.

## 2020-04-11 NOTE — ED Notes (Signed)
Patient is alert and verbal. Patient voices SI thoughts. Patient is anxious this morning. Patient given support and encouragement. Monitoring continues.

## 2020-04-11 NOTE — ED Notes (Signed)
Pt belongings in locker 25.

## 2020-04-11 NOTE — ED Provider Notes (Addendum)
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 authorized for detection and/or diagnosis of  SARS-CoV-2 by FDA under an Emergency Use Authorization (EUA).  This EUA will remain in effect (meaning this test can be used) for the duration of  the C                          OVID-19 declaration under Section 564(b)(1) of the Act, 21 U.S.C. section 360bbb-3(b)(1), unless the authorization is terminated or revoked sooner.    Admission on 03/06/2020, Discharged on 03/06/2020  Component Date Value Ref Range Status  . Sodium 03/06/2020 139  135 - 145 mmol/L Final  . Potassium 03/06/2020 3.4* 3.5 - 5.1 mmol/L Final  . Chloride 03/06/2020 104  98 - 111 mmol/L Final  . CO2 03/06/2020 23  22 - 32 mmol/L Final  . Glucose, Bld 03/06/2020 110* 70 - 99 mg/dL Final   Glucose reference range applies only to samples taken after fasting for at least 8  hours.  . BUN 03/06/2020 9  6 - 20 mg/dL Final  . Creatinine, Ser 03/06/2020 0.83  0.44 - 1.00 mg/dL Final  . Calcium 03/06/2020 9.4  8.9 - 10.3 mg/dL Final  . Total Protein 03/06/2020 7.2  6.5 - 8.1 g/dL Final  . Albumin 03/06/2020 4.2  3.5 - 5.0 g/dL Final  . AST 03/06/2020 25  15 - 41 U/L Final  . ALT 03/06/2020 24  0 - 44 U/L Final  . Alkaline Phosphatase 03/06/2020 39  38 - 126 U/L Final  . Total Bilirubin 03/06/2020 0.6  0.3 - 1.2 mg/dL Final  . GFR, Estimated 03/06/2020 >60  >60 mL/min Final   Comment: (NOTE) Calculated using the CKD-EPI Creatinine Equation (2021)   . Anion gap 03/06/2020 12  5 - 15 Final   Performed at [facility], [place] [address] [place], [place] [address]  . Alcohol, Ethyl (B) 03/06/2020 <10  <10 mg/dL Final   Comment: (NOTE) Lowest detectable limit for serum alcohol is 10 mg/dL.  For medical purposes only. Performed at [facility], [place] [address], [place], [place] [address]   . Salicylate Lvl 42/87/6811 <7.0* 7.0 - 30.0 mg/dL Final   Performed at [place] [address], [place], [place] [address]  . Acetaminophen (Tylenol), Serum 03/06/2020 <10* 10 - 30 ug/mL Final   Comment:  (NOTE) Therapeutic concentrations vary significantly. A range of 10-30 ug/mL  may be an effective concentration for many patients. However, some  are best treated at concentrations outside of this range. Acetaminophen concentrations >150 ug/mL at 4 hours after ingestion  and >50 ug/mL at 12 hours after ingestion are often associated with  toxic reactions.  Performed at [facility], [place] [address] [place], [place] [address]   . WBC 03/06/2020 14.5* 4.0 - 10.5 K/uL Final  . RBC 03/06/2020 4.81  3.87 - 5.11 MIL/uL Final  . Hemoglobin 03/06/2020 12.4  12.0 - 15.0 g/dL Final  . HCT 03/06/2020 39.8  36.0 - 46.0 % Final  . MCV 03/06/2020 82.7  80.0 - 100.0 fL Final  . MCH 03/06/2020 25.8* 26.0 - 34.0 pg Final  . MCHC 03/06/2020 31.2  30.0 - 36.0 g/dL Final  . RDW 03/06/2020 13.9  11.5 - 15.5 % Final  . Platelets 03/06/2020 255  150 - 400 K/uL Final   REPEATED TO VERIFY  . nRBC 03/06/2020 0.0  0.0 - 0.2 % Final   Performed at [facility], [place] [address] [place], [place] [address]  Admission on 02/20/2020, Discharged on 02/20/2020  Component Date Value Ref Range Status  . Lipase 02/20/2020 29  11 - 51 U/L Final   Performed at [facility], [place] [address], [place], [place] [address]  . Sodium 02/20/2020 140  135 - 145 mmol/L Final  . Potassium 02/20/2020 3.2* 3.5 - 5.1 mmol/L Final  . Chloride 02/20/2020 103  98 - 111 mmol/L Final  . CO2 02/20/2020 26  22 - 32 mmol/L Final  . Glucose, Bld 02/20/2020 92  70 - 99 mg/dL Final   Glucose reference range applies only to samples taken after fasting for at least 8 hours.  . BUN 02/20/2020 9  6 - 20 mg/dL Final  . Creatinine, Ser 02/20/2020 0.84  0.44 - 1.00 mg/dL Final  . Calcium 02/20/2020 9.6  8.9 - 10.3 mg/dL Final  . Total Protein 02/20/2020 7.1  6.5 - 8.1 g/dL Final  . Albumin 02/20/2020 4.0  3.5 - 5.0 g/dL Final  . AST 02/20/2020 27  15 - 41 U/L Final  . ALT 02/20/2020 22  0 - 44 U/L Final  . Alkaline  Phosphatase 02/20/2020 32* 38 - 126 U/L Final  . Total Bilirubin 02/20/2020 0.7  0.3 - 1.2 mg/dL Final  . GFR, Estimated 02/20/2020 >60  >60 mL/min Final   Comment: (NOTE) Calculated using the CKD-EPI Creatinine Equation (2021)   . Anion gap 02/20/2020 11  5 - 15 Final   Performed at [facility], [place] [address] [place], [place] [address]  . WBC 02/20/2020 11.2* 4.0 - 10.5 K/uL Final  . RBC 02/20/2020 4.50  3.87 - 5.11 MIL/uL Final  . Hemoglobin 02/20/2020 11.8* 12.0 - 15.0 g/dL Final  . HCT 02/20/2020 38.5  36.0 - 46.0 % Final  . MCV 02/20/2020 85.6  80.0 - 100.0 fL Final  . MCH 02/20/2020 26.2  26.0 - 34.0 pg Final  . MCHC 02/20/2020 30.6  30.0 - 36.0 g/dL Final  . RDW 02/20/2020 14.1  11.5 - 15.5 % Final  . Platelets 02/20/2020 284  150 - 400 K/uL Final  . nRBC 02/20/2020 0.0  0.0 - 0.2 % Final   Performed at [facility], [place] [address] [place], [place] [address]  . I-stat hCG, quantitative 02/20/2020 <5.0  <5 mIU/mL Final  . Comment 3 02/20/2020          Final   Comment:   GEST. AGE      CONC.  (mIU/mL)   <=1 WEEK        5 - 50     2 WEEKS       50 - 500     3 WEEKS       100 - 10,000     4 WEEKS     1,000 - 30,000        FEMALE AND NON-PREGNANT FEMALE:     LESS THAN 5 mIU/mL   . SARS Coronavirus 2 by RT PCR 02/20/2020 NEGATIVE  NEGATIVE Final   Comment: (NOTE) SARS-CoV-2 target nucleic acids are NOT DETECTED.  The SARS-CoV-2 RNA is generally detectable in upper respiratoy specimens during the acute phase of infection. The lowest concentration of SARS-CoV-2 viral copies this assay can detect is 131 copies/mL. A negative result does not preclude SARS-Cov-2 infection and should not be used as the sole basis for treatment or other patient management decisions. A negative result may occur with  improper specimen collection/handling, submission of specimen other than nasopharyngeal swab, presence of viral mutation(s) within the areas  targeted by this assay, and inadequate number of viral copies (<131 copies/mL). A negative result must be combined with clinical observations, patient history, and epidemiological information. The expected result is Negative.  Fact Sheet for Patients:  PinkCheek.be  Fact Sheet for Healthcare Providers:  GravelBags.it  This test is no                          t yet approved or cleared by the [place] FDA and  has been authorized for detection and/or diagnosis of SARS-CoV-2 by FDA under an Emergency Use Authorization (EUA). This EUA will remain  in effect (meaning this test can be used) for the duration of the COVID-19 declaration under Section 564(b)(1) of the Act, 21 U.S.C. section 360bbb-3(b)(1), unless the authorization is terminated or revoked sooner.    . Influenza A by PCR 02/20/2020 NEGATIVE  NEGATIVE Final  .  Influenza B by PCR 02/20/2020 NEGATIVE  NEGATIVE Final   Comment: (NOTE) The Xpert Xpress SARS-CoV-2/FLU/RSV assay is intended as an aid in  the diagnosis of influenza from Nasopharyngeal swab specimens and  should not be used as a sole basis for treatment. Nasal washings and  aspirates are unacceptable for Xpert Xpress SARS-CoV-2/FLU/RSV  testing.  Fact Sheet for Patients: PinkCheek.be  Fact Sheet for Healthcare Providers: GravelBags.it  This test is not yet approved or cleared by the [place] FDA and  has been authorized for detection and/or diagnosis of SARS-CoV-2 by  FDA under an Emergency Use Authorization (EUA). This EUA will remain  in effect (meaning this test can be used) for the duration of the  Covid-19 declaration under Section 564(b)(1) of the Act, 21  U.S.C. section 360bbb-3(b)(1), unless the authorization is  terminated or revoked. Performed at [facility], [place] [address], [place], [place] [address]    Admission on 02/05/2020, Discharged on 02/05/2020  Component Date Value Ref Range Status  . I-stat hCG, quantitative 02/05/2020 <5.0  <5 mIU/mL Final  . Comment 3 02/05/2020          Final   Comment:   GEST. AGE      CONC.  (mIU/mL)   <=1 WEEK        5 - 50     2 WEEKS       50 - 500     3 WEEKS       100 - 10,000     4 WEEKS     1,000 - 30,000        FEMALE AND NON-PREGNANT FEMALE:     LESS THAN 5 mIU/mL   . Color, Urine 02/05/2020 YELLOW  YELLOW Final  . APPearance 02/05/2020 HAZY* CLEAR Final  . Specific Gravity, Urine 02/05/2020 1.028  1.005 - 1.030 Final  . pH 02/05/2020 6.0  5.0 - 8.0 Final  . Glucose, UA 02/05/2020 NEGATIVE  NEGATIVE mg/dL Final  . Hgb urine dipstick 02/05/2020 NEGATIVE  NEGATIVE Final  . Bilirubin Urine 02/05/2020 NEGATIVE  NEGATIVE Final  . Ketones, ur 02/05/2020 20* NEGATIVE mg/dL Final  . Protein, ur 02/05/2020 30* NEGATIVE mg/dL Final  . Nitrite 02/05/2020 NEGATIVE  NEGATIVE Final  . Chalmers Guest 02/05/2020 LARGE* NEGATIVE Final  . RBC / HPF 02/05/2020 6-10  0 - 5 RBC/hpf Final  . WBC, UA 02/05/2020 11-20  0 - 5 WBC/hpf Final  . Bacteria, UA 02/05/2020 FEW* NONE SEEN Final  . Squamous Epithelial / LPF 02/05/2020 11-20  0 - 5 Final  . Mucus 02/05/2020 PRESENT   Final   Performed at [facility], [place] [address], [place], [place] [address]  . Neisseria Gonorrhea 02/05/2020 Negative   Final  . Chlamydia 02/05/2020 Negative   Final  . Comment 02/05/2020 Normal Reference Ranger Chlamydia - Negative   Final  . Comment 02/05/2020 Normal Reference Range Neisseria Gonorrhea - Negative   Final  . Yeast Wet Prep HPF POC 02/05/2020 NONE SEEN  NONE SEEN Final   Swab received with less than 0.5 mL of saline, saline added to specimen, interpret results with caution.  Dalbert Batman, Wet Prep 02/05/2020 NONE SEEN  NONE SEEN Final  . Clue Cells Wet Prep HPF POC 02/05/2020 NONE SEEN  NONE SEEN Final  . WBC, Wet Prep HPF POC 02/05/2020 PRESENT* NONE SEEN  Final  . Sperm 02/05/2020 NONE SEEN   Final   Performed at [facility], [place] [address]  [place], [place] [address]  Admission on 11/03/2019, Discharged on 11/03/2019  Component Date Value Ref Range Status  . Glucose-Capillary 11/03/2019 127* 70 - 99 mg/dL Final   Glucose reference range applies only to samples taken after fasting for at least 8 hours.  Admission on 10/17/2019, Discharged on 10/18/2019  Component Date Value Ref Range Status  . Glucose-Capillary 10/18/2019 171* 70 - 99 mg/dL Final   Glucose reference range applies only to samples taken after fasting for at least 8 hours.  There may be more visits with results that are not included.    Allergies: Wellbutrin [bupropion], Omnipaque [iohexol], Penicillins, Cogentin [benztropine], and Depakote er [divalproex sodium er]  PTA Medications: (Not in a hospital admission)   Medical Decision Making  Patient is a 29 y.o. female with an extensive mental health history who presents to the [facility] as a direct admit from [facility] ED for SI with a plan. Based on patient's presentation and my evaluation, patient appears to be a potential threat to herself at this time.     Recommendations  Based on my evaluation the patient does not appear to have an emergency medical condition.  Plan is to place the patient in [facility] continuous observation/assessment. She will be reassessed by the treatment team on the morning of 04/11/20. Disposition to be determined at that time.  04/10/20 ED Labs Reviewed: -Lithium Level Decreased: 0.36 mmol/L (ref. range 0.60-1.20 mmol/L) -All other ED labs unremarkable   No additional labs ordered at this time Carb Modified Diet and CBG monitoring 4 times daily ordered for Diabetes  Will restart the following home medications:   -Levemir injection 40 units SQ BID for Diabetes   -Gabapentin 300 mg PO TID PRN for nerve pain   -Lamictal 100 mg PO daily for Mood Stability   -Lithium Carbonate CR tablet 900  mg PO Daily at bedtime for Mood Stability   -Perphenazine 6 mg PO BID for mood Stability     Prescilla Sours, PA-C 04/11/20  3:39 AM

## 2020-04-11 NOTE — ED Notes (Signed)
Requesting to see social worker

## 2020-04-11 NOTE — ED Notes (Signed)
Pt sleeping'@this'$  time. breathing even and unlabored. Will continue to monitor for safety

## 2020-04-11 NOTE — ED Triage Notes (Signed)
WLED transfer pt presents with depression worsening in severity x 2 weeks.  Suicidal for weeks.  Denies HI or AVH.

## 2020-04-11 NOTE — Discharge Instructions (Signed)

## 2020-04-11 NOTE — ED Notes (Signed)
Patient's CBG was 133

## 2020-04-24 ENCOUNTER — Other Ambulatory Visit: Payer: Self-pay

## 2020-04-24 ENCOUNTER — Encounter (HOSPITAL_COMMUNITY): Payer: Self-pay

## 2020-04-24 ENCOUNTER — Emergency Department (HOSPITAL_COMMUNITY)
Admission: EM | Admit: 2020-04-24 | Discharge: 2020-04-24 | Disposition: A | Discharge status: Home / Self Care | Payer: Medicaid Other | Attending: Emergency Medicine | Admitting: Emergency Medicine

## 2020-04-24 DIAGNOSIS — R6883 Chills (without fever): Secondary | ICD-10-CM | POA: Insufficient documentation

## 2020-04-24 DIAGNOSIS — R350 Frequency of micturition: Secondary | ICD-10-CM | POA: Insufficient documentation

## 2020-04-24 DIAGNOSIS — I10 Essential (primary) hypertension: Secondary | ICD-10-CM | POA: Diagnosis not present

## 2020-04-24 DIAGNOSIS — Z79899 Other long term (current) drug therapy: Secondary | ICD-10-CM | POA: Insufficient documentation

## 2020-04-24 DIAGNOSIS — Z20822 Contact with and (suspected) exposure to covid-19: Secondary | ICD-10-CM | POA: Diagnosis not present

## 2020-04-24 DIAGNOSIS — E119 Type 2 diabetes mellitus without complications: Secondary | ICD-10-CM | POA: Insufficient documentation

## 2020-04-24 DIAGNOSIS — R110 Nausea: Secondary | ICD-10-CM | POA: Diagnosis not present

## 2020-04-24 DIAGNOSIS — F17210 Nicotine dependence, cigarettes, uncomplicated: Secondary | ICD-10-CM | POA: Diagnosis not present

## 2020-04-24 DIAGNOSIS — Z794 Long term (current) use of insulin: Secondary | ICD-10-CM | POA: Diagnosis not present

## 2020-04-24 LAB — URINALYSIS, ROUTINE W REFLEX MICROSCOPIC
Bilirubin Urine: NEGATIVE
Glucose, UA: NEGATIVE mg/dL
Hgb urine dipstick: NEGATIVE
Ketones, ur: 5 mg/dL — AB
Leukocytes,Ua: NEGATIVE
Nitrite: NEGATIVE
Protein, ur: NEGATIVE mg/dL
Specific Gravity, Urine: 1.027 (ref 1.005–1.030)
pH: 5 (ref 5.0–8.0)

## 2020-04-24 LAB — PREGNANCY, URINE: Preg Test, Ur: NEGATIVE

## 2020-04-24 LAB — RESP PANEL BY RT-PCR (FLU A&B, COVID) ARPGX2
Influenza A by PCR: NEGATIVE
Influenza B by PCR: NEGATIVE
SARS Coronavirus 2 by RT PCR: NEGATIVE

## 2020-04-24 MED ORDER — ONDANSETRON 4 MG PO TBDP: 4.0000 mg | ORAL_TABLET | Freq: Three times a day (TID) | ORAL | 0 refills | Status: DC | PRN

## 2020-04-24 NOTE — Discharge Instructions (Addendum)
You were seen in the emergency department today for nausea and chills.  Your work-up was overall reassuring.  Your urine did not show an infection.  Your Covid and flu test were negative.  We are sending you home with Zofran to take every 8 hours as needed for nausea and vomiting.  Please follow attached diet guidelines.  We have prescribed you new medication(s) today. Discuss the medications prescribed today with your pharmacist as they can have adverse effects and interactions with your other medicines including over the counter and prescribed medications. Seek medical evaluation if you start to experience new or abnormal symptoms after taking one of these medicines, seek care immediately if you start to experience difficulty breathing, feeling of your throat closing, facial swelling, or rash as these could be indications of a more serious allergic reaction  Please follow-up with your primary care provider within 3 days.  Return to the ER for new or worsening symptoms including but not limited to inability to keep fluids down, fever, blood in vomit, abdominal pain, blood in your stool, or any other concerns.

## 2020-04-24 NOTE — ED Triage Notes (Signed)
Pt reports chills, fatigue, and generalized body aches x2 days. Pt denies being vaccinated for COVID.

## 2020-04-24 NOTE — ED Provider Notes (Signed)
[facility]-EMERGENCY DEPT Provider Note   CSN: [id number] Arrival date & time: 04/24/20  1616     History Chief Complaint  Patient presents with  . Chills    [name] is a 29 y.o. female with a hx of diabetes mellitus, hypertension, anxiety, bipolar 1 disorder, hyperlipidemia, & schizoaffective disorder who presents to the ED with complaints of chills over the past 1 week.  Patient states she has episodes of feeling hot and cold and has had some nausea with occasional episodes of emesis.  She has been keeping things down.  She has also had some urinary frequency.  Denies chest pain, dyspnea, cough, abdominal pain, diarrhea, vaginal discharge, sore throat, ear pain, or syncope.  HPI     Past Medical History:  Diagnosis Date  . Anxiety   . Bipolar 1 disorder (HCC)   . Cognitive deficits   . Depression   . Diabetes mellitus without complication (HCC)   . Hypertension   . Mental disorder   . Mental health disorder   . Obesity     Patient Active Problem List   Diagnosis Date Noted  . Anxiety state 03/06/2020  . Schizophrenia (HCC) 09/13/2019  . Bipolar 1 disorder (HCC) 06/23/2019  . MDD (major depressive disorder) 10/10/2018  . Schizoaffective disorder, bipolar type (HCC) 09/25/2018  . Bipolar I disorder (HCC) 06/13/2018  . HTN (hypertension) 05/03/2018  . Tobacco use disorder 05/03/2018  . Bipolar I disorder, most recent episode depressed, severe without psychotic features (HCC) 05/02/2018  . Adjustment disorder with emotional disturbance 01/02/2018  . 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 Moderate bipolar I disorder, most recent episode depressed (HCC)   . Psychosis (HCC)   . 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 OCD (obsessive compulsive disorder) 10/05/2016  . Major depressive disorder, recurrent episode, mild (HCC) 05/04/2016  . 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 Learning disability 07/18/2015  . 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 Diabetes mellitus (HCC) 07/18/2015  . 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 Hyperlipidemia 07/18/2015  . Severe episode of recurrent major depressive disorder, without psychotic features (HCC)   . Suicidal ideation   . Drug overdose   . Cognitive deficits 10/12/2012  . Generalized anxiety disorder 06/28/2012    Past Surgical History:  Procedure Laterality Date  . CESAREAN SECTION    . CESAREAN SECTION N/A 04/25/2013   Procedure: REPEAT CESAREAN SECTION;  Surgeon: Catalina Antigua, MD;  Location: [facility];  Service: Obstetrics;  Laterality: N/A;  . MASS EXCISION N/A 06/03/2012   Procedure: EXCISION MASS;  Surgeon: Osborn Coho, MD;  Location: [facility];  Service: ENT;  Laterality: N/A;  Excision uvula mass  . TONSILLECTOMY N/A 06/03/2012   Procedure: TONSILLECTOMY;  Surgeon: Osborn Coho, MD;  Location: [facility];  Service: ENT;  Laterality: N/A;  . TONSILLECTOMY       OB History    Gravida  3   Para  3   Term  3   Preterm  0   AB  0   Living  3     SAB  0   IAB  0   Ectopic  0   Multiple      Live Births  3           Family History  Problem Relation Age of Onset  . Hypertension Mother   . Diabetes Father  Social History   Tobacco Use  . Smoking status: Current Every Day Smoker    Packs/day: 4.00    Years: 11.00    Pack years: 44.00    Types: Cigarettes  . Smokeless tobacco: Never Used  . Tobacco comment: Pt declined  Vaping Use  . Vaping Use: Never used  Substance Use Topics  . Alcohol use: Not Currently    Comment: occ: last intake 4 mts ago  . Drug use: Not Currently    Types: "Crack" cocaine, Other-see comments    Comment: Patient reports hx of smoking Crack    Home Medications Prior to Admission medications   Medication Sig Start Date End Date Taking?  Authorizing Provider  amLODipine (NORVASC) 5 MG tablet Take 5 mg by mouth daily.    [provider]  ARIPiprazole ER (ABILIFY MAINTENA) 400 MG SRER injection Inject 2 mLs (400 mg total) into the muscle every 28 (twenty-eight) days. 06/28/19   Johnn Hai, MD  haloperidol decanoate (HALDOL DECANOATE) 50 MG/ML injection Inject 50 mg into the muscle every 28 (twenty-eight) days. 03/19/20   [provider]  insulin detemir (LEVEMIR) 100 UNIT/ML injection Inject 0.4 mLs (40 Units total) into the skin 2 (two) times daily. Patient not taking: Reported on 04/10/2020 03/08/20   Money, Lowry Ram, FNP  lamoTRIgine (LAMICTAL) 100 MG tablet Take 100 mg by mouth daily. 08/21/19   [provider]  LEVEMIR FLEXTOUCH 100 UNIT/ML FlexPen Inject 40 Units into the skin 2 (two) times daily.  08/09/19   [provider]  lithium carbonate (ESKALITH) 450 MG CR tablet Take 2 tablets (900 mg total) by mouth at bedtime. 06/02/19   Johnn Hai, MD  perphenazine (TRILAFON) 2 MG tablet Take 3 tablets (6 mg total) by mouth 2 (two) times daily. 04/11/20   Money, Lowry Ram, FNP  propranolol (INDERAL) 10 MG tablet Take 1 tablet (10 mg total) by mouth 2 (two) times daily. 06/02/19   Johnn Hai, MD  simvastatin (ZOCOR) 20 MG tablet Take 20 mg by mouth at bedtime. 08/21/19   [provider]  vitamin B-12 (CYANOCOBALAMIN) 1000 MCG tablet Take 1,000 mcg by mouth daily. 11/13/19   [provider]    Allergies    Wellbutrin [bupropion], Omnipaque [iohexol], Penicillins, Cogentin [benztropine], and Depakote er [divalproex sodium er]  Review of Systems   Review of Systems  Constitutional: Positive for chills.       Positive for hot/cold spells.   HENT: Negative for congestion, ear pain and sore throat.   Respiratory: Negative for cough and shortness of breath.   Cardiovascular: Negative for chest pain.  Gastrointestinal: Positive for nausea and vomiting. Negative for abdominal pain, blood  in stool, constipation and diarrhea.  Genitourinary: Positive for frequency. Negative for vaginal discharge.  Neurological: Negative for syncope.  All other systems reviewed and are negative.   Physical Exam Updated Vital Signs BP 137/83 (BP Location: Left Arm)   Pulse 96   Temp 99.3 F (37.4 C) (Oral)   Resp 18   LMP 04/10/2020 (Approximate)   SpO2 100%   Physical Exam Vitals and nursing note reviewed.  Constitutional:      General: She is not in acute distress.    Appearance: She is well-developed. She is not toxic-appearing.  HENT:     Head: Normocephalic and atraumatic.  Eyes:     General:        Right eye: No discharge.        Left eye: No discharge.  Conjunctiva/sclera: Conjunctivae normal.  Cardiovascular:     Rate and Rhythm: Normal rate and regular rhythm.  Pulmonary:     Effort: Pulmonary effort is normal. No respiratory distress.     Breath sounds: Normal breath sounds. No wheezing, rhonchi or rales.  Abdominal:     General: There is no distension.     Palpations: Abdomen is soft.     Tenderness: There is no abdominal tenderness. There is no guarding or rebound.  Musculoskeletal:     Cervical back: Neck supple.  Skin:    General: Skin is warm and dry.     Findings: No rash.  Neurological:     Mental Status: She is alert.     Comments: Clear speech.   Psychiatric:        Behavior: Behavior normal.     ED Results / Procedures / Treatments   Labs (all labs ordered are listed, but only abnormal results are displayed) Labs Reviewed  RESP PANEL BY RT-PCR (FLU A&B, COVID) [id number]  URINALYSIS, ROUTINE W REFLEX MICROSCOPIC  PREGNANCY, URINE    EKG None  Radiology No results found.  Procedures Procedures (including critical care time)  Medications Ordered in ED Medications - No data to display  ED Course  I have reviewed the triage vital signs and the nursing notes.  Pertinent labs & imaging results that were available during my care of the  patient were reviewed by me and considered in my medical decision making (see chart for details).    MDM Rules/Calculators/A&P                          Patient presents to the emergency department with complaints of nausea as well as intermittent hot/cold spells.  She is nontoxic, resting comfortably, her vitals are fairly unremarkable.  She has a benign physical exam.  Lab testing ordered including pregnancy test which is negative, urinalysis which does not show UTI, mild ketonuria but no glucosuria in combination with this to raise concern for DKA, and Covid/influenza swab which are negative.  Patient's abdomen is completely nontender without peritoneal signs, I have a very low suspicion for acute surgical process.  She is tolerating p.o. in the emergency department without difficulty.  Unclear definitive etiology to her symptoms.  Will prescribe a short course of Zofran to take as needed with close PCP follow-up. I discussed results, treatment plan, need for follow-up, and return precautions with the patient. Provided opportunity for questions, patient confirmed understanding and is in agreement with plan.   Final Clinical Impression(s) / ED Diagnoses Final diagnoses:  Nausea    Rx / DC Orders ED Discharge Orders         Ordered    ondansetron (ZOFRAN ODT) 4 MG disintegrating tablet  Every 8 hours PRN        04/24/20 2212           Amaryllis Dyke, PA-C 04/24/20 2222    Lucrezia Starch, MD 04/25/20 2349

## 2020-04-28 ENCOUNTER — Emergency Department (HOSPITAL_COMMUNITY)
Admission: EM | Admit: 2020-04-28 | Discharge: 2020-04-29 | Disposition: A | Discharge status: Home / Self Care | Payer: Medicaid Other | Attending: Emergency Medicine | Admitting: Emergency Medicine

## 2020-04-28 ENCOUNTER — Other Ambulatory Visit: Payer: Self-pay

## 2020-04-28 DIAGNOSIS — N898 Other specified noninflammatory disorders of vagina: Secondary | ICD-10-CM | POA: Diagnosis not present

## 2020-04-28 DIAGNOSIS — Z5321 Procedure and treatment not carried out due to patient leaving prior to being seen by health care provider: Secondary | ICD-10-CM | POA: Diagnosis not present

## 2020-04-28 NOTE — ED Notes (Signed)
Pt left due to not being seen quick enough 

## 2020-04-28 NOTE — ED Triage Notes (Signed)
Pt c/o vaginal itching, burning and discharge x 4 days.  VS stable.

## 2020-04-30 ENCOUNTER — Other Ambulatory Visit: Payer: Self-pay

## 2020-04-30 ENCOUNTER — Emergency Department (HOSPITAL_COMMUNITY)
Admission: EM | Admit: 2020-04-30 | Discharge: 2020-05-02 | Disposition: A | Discharge status: Home / Self Care | Payer: Medicaid Other | Attending: Emergency Medicine | Admitting: Emergency Medicine

## 2020-04-30 ENCOUNTER — Encounter (HOSPITAL_COMMUNITY): Payer: Self-pay

## 2020-04-30 DIAGNOSIS — T465X2A Poisoning by other antihypertensive drugs, intentional self-harm, initial encounter: Secondary | ICD-10-CM | POA: Diagnosis not present

## 2020-04-30 DIAGNOSIS — T1491XA Suicide attempt, initial encounter: Secondary | ICD-10-CM | POA: Diagnosis present

## 2020-04-30 DIAGNOSIS — F332 Major depressive disorder, recurrent severe without psychotic features: Secondary | ICD-10-CM | POA: Diagnosis present

## 2020-04-30 DIAGNOSIS — Z794 Long term (current) use of insulin: Secondary | ICD-10-CM | POA: Diagnosis not present

## 2020-04-30 DIAGNOSIS — F17210 Nicotine dependence, cigarettes, uncomplicated: Secondary | ICD-10-CM | POA: Insufficient documentation

## 2020-04-30 DIAGNOSIS — E119 Type 2 diabetes mellitus without complications: Secondary | ICD-10-CM | POA: Insufficient documentation

## 2020-04-30 DIAGNOSIS — Z20822 Contact with and (suspected) exposure to covid-19: Secondary | ICD-10-CM | POA: Diagnosis not present

## 2020-04-30 DIAGNOSIS — X58XXXA Exposure to other specified factors, initial encounter: Secondary | ICD-10-CM | POA: Insufficient documentation

## 2020-04-30 DIAGNOSIS — Z7984 Long term (current) use of oral hypoglycemic drugs: Secondary | ICD-10-CM | POA: Insufficient documentation

## 2020-04-30 DIAGNOSIS — F250 Schizoaffective disorder, bipolar type: Secondary | ICD-10-CM | POA: Diagnosis present

## 2020-04-30 DIAGNOSIS — I10 Essential (primary) hypertension: Secondary | ICD-10-CM | POA: Diagnosis not present

## 2020-04-30 DIAGNOSIS — F411 Generalized anxiety disorder: Secondary | ICD-10-CM | POA: Diagnosis not present

## 2020-04-30 LAB — RESP PANEL BY RT-PCR (FLU A&B, COVID) ARPGX2
Influenza A by PCR: NEGATIVE
Influenza B by PCR: NEGATIVE
SARS Coronavirus 2 by RT PCR: NEGATIVE

## 2020-04-30 LAB — COMPREHENSIVE METABOLIC PANEL
ALT: 19 U/L (ref 0–44)
AST: 21 U/L (ref 15–41)
Albumin: 4.4 g/dL (ref 3.5–5.0)
Alkaline Phosphatase: 34 U/L — ABNORMAL LOW (ref 38–126)
Anion gap: 9 (ref 5–15)
BUN: 8 mg/dL (ref 6–20)
CO2: 24 mmol/L (ref 22–32)
Calcium: 9.4 mg/dL (ref 8.9–10.3)
Chloride: 109 mmol/L (ref 98–111)
Creatinine, Ser: 0.66 mg/dL (ref 0.44–1.00)
GFR, Estimated: >60 mL/min (ref >60)
Glucose, Bld: 96 mg/dL (ref 70–99)
Potassium: 3.6 mmol/L (ref 3.5–5.1)
Sodium: 142 mmol/L (ref 135–145)
Total Bilirubin: 0.5 mg/dL (ref 0.3–1.2)
Total Protein: 7.4 g/dL (ref 6.5–8.1)

## 2020-04-30 LAB — CBC WITH DIFFERENTIAL/PLATELET
Abs Immature Granulocytes: 0.03 10*3/uL (ref 0.00–0.07)
Basophils Absolute: 0.1 10*3/uL (ref 0.0–0.1)
Basophils Relative: 1 %
Eosinophils Absolute: 0.4 10*3/uL (ref 0.0–0.5)
Eosinophils Relative: 4 %
HCT: 37.9 % (ref 36.0–46.0)
Hemoglobin: 11.5 g/dL — ABNORMAL LOW (ref 12.0–15.0)
Immature Granulocytes: 0 %
Lymphocytes Relative: 33 %
Lymphs Abs: 3.7 10*3/uL (ref 0.7–4.0)
MCH: 25.6 pg — ABNORMAL LOW (ref 26.0–34.0)
MCHC: 30.3 g/dL (ref 30.0–36.0)
MCV: 84.2 fL (ref 80.0–100.0)
Monocytes Absolute: 0.6 10*3/uL (ref 0.1–1.0)
Monocytes Relative: 5 %
Neutro Abs: 6.5 10*3/uL (ref 1.7–7.7)
Neutrophils Relative %: 57 %
Platelets: 268 10*3/uL (ref 150–400)
RBC: 4.5 MIL/uL (ref 3.87–5.11)
RDW: 15.7 % — ABNORMAL HIGH (ref 11.5–15.5)
WBC: 11.4 10*3/uL — ABNORMAL HIGH (ref 4.0–10.5)
nRBC: 0 % (ref 0.0–0.2)

## 2020-04-30 LAB — ETHANOL: Alcohol, Ethyl (B): <10 mg/dL (ref <10)

## 2020-04-30 LAB — SALICYLATE LEVEL: Salicylate Lvl: <7 mg/dL — ABNORMAL LOW (ref 7.0–30.0)

## 2020-04-30 LAB — RAPID URINE DRUG SCREEN, HOSP PERFORMED
Amphetamines: NOT DETECTED
Barbiturates: NOT DETECTED
Benzodiazepines: NOT DETECTED
Cocaine: NOT DETECTED
Opiates: NOT DETECTED
Tetrahydrocannabinol: NOT DETECTED

## 2020-04-30 LAB — I-STAT BETA HCG BLOOD, ED (MC, WL, AP ONLY): I-stat hCG, quantitative: <5 m[IU]/mL (ref <5)

## 2020-04-30 LAB — ACETAMINOPHEN LEVEL: Acetaminophen (Tylenol), Serum: <10 ug/mL — ABNORMAL LOW (ref 10–30)

## 2020-04-30 MED ORDER — LORAZEPAM 1 MG PO TABS
2.0000 mg | ORAL_TABLET | Freq: Once | ORAL | Status: AC
  Administered 2020-04-30: 2 mg via ORAL
  Filled 2020-04-30: qty 2

## 2020-04-30 MED ORDER — ZIPRASIDONE MESYLATE 20 MG IM SOLR
20.0000 mg | Freq: Once | INTRAMUSCULAR | Status: AC
  Administered 2020-04-30: 20 mg via INTRAMUSCULAR
  Filled 2020-04-30: qty 20

## 2020-04-30 MED ORDER — ONDANSETRON HCL 4 MG PO TABS: 4.0000 mg | ORAL_TABLET | Freq: Three times a day (TID) | ORAL | Status: DC | PRN

## 2020-04-30 MED ORDER — LORAZEPAM 2 MG/ML IJ SOLN
2.0000 mg | Freq: Once | INTRAMUSCULAR | Status: AC
  Administered 2020-04-30: 2 mg via INTRAMUSCULAR
  Filled 2020-04-30: qty 1

## 2020-04-30 MED ORDER — FLUCONAZOLE 150 MG PO TABS
150.0000 mg | ORAL_TABLET | Freq: Once | ORAL | Status: AC
  Administered 2020-04-30: 150 mg via ORAL
  Filled 2020-04-30: qty 1

## 2020-04-30 MED ORDER — ACETAMINOPHEN 325 MG PO TABS
650.0000 mg | ORAL_TABLET | ORAL | Status: DC | PRN
  Administered 2020-04-30: 650 mg via ORAL
  Filled 2020-04-30: qty 2

## 2020-04-30 MED ORDER — STERILE WATER FOR INJECTION IJ SOLN
INTRAMUSCULAR | Status: AC
  Administered 2020-04-30: 1.2 mL
  Filled 2020-04-30: qty 10

## 2020-04-30 NOTE — ED Triage Notes (Signed)
Pt arrived via EMS. Pt took some prescription meds: 1500mg  of gabapentin and double the prescription dose of lamictal, propanolol, and her anti-psychotic. Pt has previous suicide attempts. Pt reports that she wants to go back to the group home. Pt states that he can feel demons and ghosts and that she wants to die. Pt is A&Ox 4. Pt refused an IV from EMS.

## 2020-04-30 NOTE — ED Notes (Signed)
Poison control was called the 6hour mark has passed and this patient has been cleared. They would of expected cardiac changes, drowsiness, bradycardia, and hypotension. This patient had none of these symptoms and is cleared for TTS consult.

## 2020-04-30 NOTE — ED Provider Notes (Signed)
[facility]-EMERGENCY DEPT Provider Note   CSN: [id number] Arrival date & time: 04/30/20  1838     History Chief Complaint  Patient presents with  . Suicide Attempt    Jailen Euphemia Lingerfelt is a 30 y.o. female past medical history of bipolar 1 disorder, schizoaffective disorder, schizophrenia, diabetes, hypertension, adjustment disorder, major depression, suicide attempt, presenting to the emergency department via EMS for intentional medication overdose.  Patient states she took extra of her prescribed medications including about "4-5 tabs of gabapentin, 2 tabs of Lamictal (prescribed 100 mg tablets), 2 blood pressure pills (patient possibly prescribed amlodipine versus propranolol), and 4 of her daily vitamins.  She states she began to feel funny and therefore called EMS.  She states she took the medications about an hour prior to arrival.  She did this with intent to end her life.  She states she no longer believes in God, she believes in the devil and prays to the devil.  She states she is having a lot of stress at home and wants to return to the group home where she was residing prior.  She is requesting to speak with social work.  She is also requesting treatment for vaginal yeast infection which has been recurring.   Of note, patient is well-known to this ED, with frequent ED visits for various chronic and psychiatric complaints.  The history is provided by the patient and medical records.       Past Medical History:  Diagnosis Date  . Anxiety   . Bipolar 1 disorder (HCC)   . Cognitive deficits   . Depression   . Diabetes mellitus without complication (HCC)   . Hypertension   . Mental disorder   . Mental health disorder   . Obesity     Patient Active Problem List   Diagnosis Date Noted  . Anxiety state 03/06/2020  . Schizophrenia (HCC) 09/13/2019  . Bipolar 1 disorder (HCC) 06/23/2019  . MDD (major depressive disorder) 10/10/2018  . Schizoaffective  disorder, bipolar type (HCC) 09/25/2018  . Bipolar I disorder (HCC) 06/13/2018  . HTN (hypertension) 05/03/2018  . Tobacco use disorder 05/03/2018  . Bipolar I disorder, most recent episode depressed, severe without psychotic features (HCC) 05/02/2018  . Adjustment disorder with emotional disturbance 01/02/2018  . 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 Moderate bipolar I disorder, most recent episode depressed (HCC)   . Psychosis (HCC)   . 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 OCD (obsessive compulsive disorder) 10/05/2016  . Major depressive disorder, recurrent episode, mild (HCC) 05/04/2016  . 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 Learning disability 07/18/2015  . 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 Diabetes mellitus (HCC) 07/18/2015  . 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 Hyperlipidemia 07/18/2015  . Severe episode of recurrent major depressive disorder, without psychotic features (HCC)   . Suicidal ideation   . Drug overdose   . Cognitive deficits 10/12/2012  . Generalized anxiety disorder 06/28/2012    Past Surgical History:  Procedure Laterality Date  . CESAREAN SECTION    . CESAREAN SECTION N/A 04/25/2013   Procedure: REPEAT CESAREAN SECTION;  Surgeon: Catalina Antigua, MD;  Location: [facility];  Service: Obstetrics;  Laterality: N/A;  . MASS EXCISION N/A 06/03/2012   Procedure: EXCISION MASS;  Surgeon: Osborn Coho, MD;  Location: [facility];  Service: ENT;  Laterality: N/A;  Excision uvula mass  . TONSILLECTOMY  N/A 06/03/2012   Procedure: TONSILLECTOMY;  Surgeon: [name], MD;  Location: [facility];  Service: ENT;  Laterality: N/A;  . TONSILLECTOMY       OB History    Gravida  3   Para  3   Term  3   Preterm  0   AB  0   Living  3     SAB  0   IAB  0   Ectopic  0   Multiple       Live Births  3           Family History  Problem Relation Age of Onset  . Hypertension Mother   . Diabetes Father     Social History   Tobacco Use  . Smoking status: Current Every Day Smoker    Packs/day: 4.00    Years: 11.00    Pack years: 44.00    Types: Cigarettes  . Smokeless tobacco: Never Used  . Tobacco comment: Pt declined  Vaping Use  . Vaping Use: Never used  Substance Use Topics  . Alcohol use: Not Currently    Comment: occ: last intake 4 mts ago  . Drug use: Not Currently    Types: "Crack" cocaine, Other-see comments    Comment: Patient reports hx of smoking Crack    Home Medications Prior to Admission medications   Medication Sig Start Date End Date Taking? Authorizing Provider  gabapentin (NEURONTIN) 300 MG capsule Take 300 mg by mouth once.   Yes [provider]  glipiZIDE (GLUCOTROL XL) 10 MG 24 hr tablet Take 10 mg by mouth once.   Yes [provider]  lamoTRIgine (LAMICTAL) 100 MG tablet Take 100 mg by mouth once.   Yes [provider]  LEVEMIR FLEXTOUCH 100 UNIT/ML FlexPen Inject 40 Units into the skin 2 (two) times daily.  08/09/19  Yes [provider]  lithium carbonate (ESKALITH) 450 MG CR tablet Take 2 tablets (900 mg total) by mouth at bedtime. 06/02/19  Yes Johnn Hai, MD  perphenazine (TRILAFON) 2 MG tablet Take 3 tablets (6 mg total) by mouth 2 (two) times daily. 04/11/20  Yes Money, Lowry Ram, FNP  propranolol (INDERAL) 10 MG tablet Take 1 tablet (10 mg total) by mouth 2 (two) times daily. 06/02/19  Yes Johnn Hai, MD  simvastatin (ZOCOR) 20 MG tablet Take 20 mg by mouth at bedtime. 08/21/19  Yes [provider]  vitamin B-12 (CYANOCOBALAMIN) 1000 MCG tablet Take 1,000 mcg by mouth daily. 11/13/19  Yes [provider]  ABILIFY MAINTENA 400 MG PRSY prefilled syringe SMARTSIG:1 Each IM Once a Month 04/24/20   [provider]  ARIPiprazole ER (ABILIFY MAINTENA) 400 MG SRER  injection Inject 2 mLs (400 mg total) into the muscle every 28 (twenty-eight) days. Patient not taking: Reported on 04/30/2020 06/28/19   Johnn Hai, MD  haloperidol decanoate (HALDOL DECANOATE) 50 MG/ML injection Inject 50 mg into the muscle every 28 (twenty-eight) days. 03/19/20   [provider]  insulin detemir (LEVEMIR) 100 UNIT/ML injection Inject 0.4 mLs (40 Units total) into the skin 2 (two) times daily. Patient not taking: No sig reported 03/08/20   Money, Lowry Ram, FNP  ondansetron (ZOFRAN ODT) 4 MG disintegrating tablet Take 1 tablet (4 mg total) by mouth every 8 (eight) hours as needed for nausea or vomiting. 04/24/20   Petrucelli, Samantha R, PA-C    Allergies    Wellbutrin [bupropion], Omnipaque [iohexol], Penicillins, Cogentin [benztropine], and Depakote er [employer]  sodium er]  Review of Systems   Review of Systems  All other systems reviewed and are negative.   Physical Exam Updated Vital Signs BP (!) 127/93 (BP Location: Right Arm)   Pulse 80   Temp 99.8 F (37.7 C) (Oral)   Resp 16   LMP 04/10/2020 (Approximate)   SpO2 99%   Physical Exam Vitals and nursing note reviewed.  Constitutional:      Appearance: She is well-developed and well-nourished.  HENT:     Head: Normocephalic and atraumatic.  Eyes:     Conjunctiva/sclera: Conjunctivae normal.  Cardiovascular:     Rate and Rhythm: Normal rate and regular rhythm.  Pulmonary:     Effort: Pulmonary effort is normal. No respiratory distress.     Breath sounds: Normal breath sounds.  Abdominal:     General: Bowel sounds are normal.     Palpations: Abdomen is soft.     Tenderness: There is no abdominal tenderness.  Skin:    General: Skin is warm.  Neurological:     Mental Status: She is alert.  Psychiatric:        Attention and Perception: Attention normal.        Mood and Affect: Mood is anxious.        Speech: Speech is rapid and pressured.        Thought Content: Thought content includes  suicidal ideation.     Comments: Patient intermittently will shake her right hand while speaking in an anxious manner.     ED Results / Procedures / Treatments   Labs (all labs ordered are listed, but only abnormal results are displayed) Labs Reviewed  COMPREHENSIVE METABOLIC PANEL - Abnormal; Notable for the following components:      Result Value   Alkaline Phosphatase 34 (*)    All other components within normal limits  CBC WITH DIFFERENTIAL/PLATELET - Abnormal; Notable for the following components:   WBC 11.4 (*)    Hemoglobin 11.5 (*)    MCH 25.6 (*)    RDW 15.7 (*)    All other components within normal limits  ACETAMINOPHEN LEVEL - Abnormal; Notable for the following components:   Acetaminophen (Tylenol), Serum <10 (*)    All other components within normal limits  SALICYLATE LEVEL - Abnormal; Notable for the following components:   Salicylate Lvl [id number] (*)    All other components within normal limits  RESP PANEL BY RT-PCR (FLU A&B, COVID) ARPGX2  ETHANOL  RAPID URINE DRUG SCREEN, HOSP PERFORMED  LAMOTRIGINE LEVEL  I-STAT BETA HCG BLOOD, ED ([facility], [facility], [facility] ONLY)    EKG None  Radiology No results found.  Procedures Procedures (including critical care time)  Medications Ordered in ED Medications  fluconazole (DIFLUCAN) tablet 150 mg (has no administration in time range)  acetaminophen (TYLENOL) tablet 650 mg (has no administration in time range)  ondansetron (ZOFRAN) tablet 4 mg (has no administration in time range)    ED Course  I have reviewed the triage vital signs and the nursing notes.  Pertinent labs & imaging results that were available during my care of the patient were reviewed by me and considered in my medical decision making (see chart for details).    MDM Rules/Calculators/A&P                          Patient brought in for tensional overdose of her home medications as listed above.  She is anxious on evaluation, with abnormal intermittent  intentional shaking of her right hand to express her anxiety.  She reports stress at home and request to be placed back at a group home from which she used to live.  She is currently voluntary, though will require IVC if she decided she no longer wants to stay.  Medical clearance labs ordered including Lamictal level.  RN contacted poison control and recommends no further interventions, she is cleared as she is asymptomatic.  Laboratory work-up was unremarkable.  Pending Lamictal level, she is cleared for TTS evaluation and disposition.  Final Clinical Impression(s) / ED Diagnoses Final diagnoses:  Suicide attempt [facility])    Rx / [place] Orders ED Discharge Orders    None       Mada Sadik, [place] N, PA-C 04/30/20 2159    Merrily Pew, MD 05/01/20 0130

## 2020-04-30 NOTE — ED Notes (Signed)
Patient changed into burgundy scrubs. 2 patient bag belongings including a brown hair wig with a ponytail, black sports bra, black jeans, black shirt, black jacket, white purse, black shoes. 2 patient bags are located right across from nursing station and room  25.

## 2020-05-01 ENCOUNTER — Encounter (HOSPITAL_COMMUNITY): Payer: Self-pay | Admitting: Registered Nurse

## 2020-05-01 DIAGNOSIS — F332 Major depressive disorder, recurrent severe without psychotic features: Secondary | ICD-10-CM

## 2020-05-01 DIAGNOSIS — T1491XA Suicide attempt, initial encounter: Secondary | ICD-10-CM | POA: Insufficient documentation

## 2020-05-01 DIAGNOSIS — F411 Generalized anxiety disorder: Secondary | ICD-10-CM

## 2020-05-01 NOTE — Consult Note (Addendum)
Telepsych Consultation   Reason for Consult:  Suicide attempt Referring Physician:  Robinson, [place] N, PA-C Location of Patient: [facility] ED Location of Provider: Other: [facility]  Patient Identification: [name] MRN:  [id number] Principal Diagnosis: Severe episode of recurrent major depressive disorder, without psychotic features (HCC) Diagnosis:  Principal Problem:   Severe episode of recurrent major depressive disorder, without psychotic features (HCC) Active Problems:   Generalized anxiety disorder   Schizoaffective disorder, bipolar type (HCC)   Total Time spent with patient: 30 minutes  Subjective:   [name] is a 30 y.o. female patient admitted [facility] ED after presenting via EMS with complaints of overdose of her medications in a suicide attempt.  HPI:  [name], 30 y.o., female patient seen via tele psych by this provider, consulted with Dr. Bronwen Betters; and chart reviewed on 05/01/20.  On evaluation [name] reports she is feeling depressed and angry.  States that he medications are not working.  Reports she has a ACTT team and psychiatrist but "They ain't doing nothing.  I need to have my medications changed casue they ain't working."  Patient states see ACTT team once a week when to bring on her medications.  Patient also reports that she was recently discharged from inpatient psychiatric treatment where her medications were changed but they are still not working.  Patient also states one of her stressors is not liking the apartment she is living in and wanting to get into a group home in [facility].  Patient is unable to contract for safety.  States that she is discharged home she will take another overdose. During evaluation [name] is slightly elevated up in bed in no acute distress.  She is alert, oriented x 4, calm and cooperative.  Her mood is depressed and with congruent affect.  She does not appear to be responding to  internal/external stimuli or delusional thoughts.  Patient denies homicidal ideation, psychosis, and paranoia.  Patient answered question appropriately.   Past Psychiatric History: Schizoaffective disorder bipolar type, major depressive disorder, substance abuse.  Risk to Self:  Yes Risk to Others:  No Prior Inpatient Therapy:  Yes Prior Outpatient Therapy:  Yes  Past Medical History:  Past Medical History:  Diagnosis Date  . Anxiety   . Bipolar 1 disorder (HCC)   . Cognitive deficits   . Depression   . Diabetes mellitus without complication (HCC)   . Hypertension   . Mental disorder   . Mental health disorder   . Obesity     Past Surgical History:  Procedure Laterality Date  . CESAREAN SECTION    . CESAREAN SECTION N/A 04/25/2013   Procedure: REPEAT CESAREAN SECTION;  Surgeon: Catalina Antigua, MD;  Location: [facility];  Service: Obstetrics;  Laterality: N/A;  . MASS EXCISION N/A 06/03/2012   Procedure: EXCISION MASS;  Surgeon: Osborn Coho, MD;  Location: [facility];  Service: ENT;  Laterality: N/A;  Excision uvula mass  . TONSILLECTOMY N/A 06/03/2012   Procedure: TONSILLECTOMY;  Surgeon: Osborn Coho, MD;  Location: [facility];  Service: ENT;  Laterality: N/A;  . TONSILLECTOMY     Family History:  Family History  Problem Relation Age of Onset  . Hypertension Mother   . Diabetes Father    Family Psychiatric  History: Unaware Social History:  Social History   Substance and Sexual Activity  Alcohol Use Not Currently   Comment: occ: last intake 4 mts ago  Social History   Substance and Sexual Activity  Drug Use Not Currently  . Types: "Crack" cocaine, Other-see comments   Comment: Patient reports hx of smoking Crack    Social History   Socioeconomic History  . Marital status: Single    Spouse name: Not on file  . Number of children: Not on file  . Years of education: Not on file  . Highest education level: Not on file   Occupational History  . Not on file  Tobacco Use  . Smoking status: Current Every Day Smoker    Packs/day: 4.00    Years: 11.00    Pack years: 44.00    Types: Cigarettes  . Smokeless tobacco: Never Used  . Tobacco comment: Pt declined  Vaping Use  . Vaping Use: Never used  Substance and Sexual Activity  . Alcohol use: Not Currently    Comment: occ: last intake 4 mts ago  . Drug use: Not Currently    Types: "Crack" cocaine, Other-see comments    Comment: Patient reports hx of smoking Crack  . Sexual activity: Yes    Birth control/protection: Implant  Other Topics Concern  . Not on file  Social History Narrative   ** Merged History Encounter **       Social Determinants of Health   Financial Resource Strain: Not on file  Food Insecurity: Not on file  Transportation Needs: Not on file  Physical Activity: Not on file  Stress: Not on file  Social Connections: Not on file   Additional Social History:    Allergies:   Allergies  Allergen Reactions  . Wellbutrin [Bupropion] Shortness Of Breath  . Omnipaque [Iohexol] Swelling and Other (See Comments)    Reaction:  Eye swelling  . Penicillins Hives and Other (See Comments)    Has patient had a PCN reaction causing immediate rash, facial/tongue/throat swelling, SOB or lightheadedness with hypotension: Unknown Has patient had a PCN reaction causing severe rash involving mucus membranes or skin necrosis: Yes Has patient had a PCN reaction that required hospitalization Unknown Has patient had a PCN reaction occurring within the last 10 years: No If all of the above answers are "NO", then may proceed with Cephalosporin use.  Herma Mering [Benztropine]     Make pt feel crazy  . Depakote Er [Divalproex Sodium Er] Nausea And Vomiting    Labs:  Results for orders placed or performed during the hospital encounter of 04/30/20 (from the past 48 hour(s))  Urine rapid drug screen (hosp performed)     Status: None   Collection Time:  04/30/20  7:34 PM  Result Value Ref Range   Opiates NONE DETECTED NONE DETECTED   Cocaine NONE DETECTED NONE DETECTED   Benzodiazepines NONE DETECTED NONE DETECTED   Amphetamines NONE DETECTED NONE DETECTED   Tetrahydrocannabinol NONE DETECTED NONE DETECTED   Barbiturates NONE DETECTED NONE DETECTED    Comment: (NOTE) DRUG SCREEN FOR MEDICAL PURPOSES ONLY.  IF CONFIRMATION IS NEEDED FOR ANY PURPOSE, NOTIFY LAB WITHIN 5 DAYS.  LOWEST DETECTABLE LIMITS FOR URINE DRUG SCREEN Drug Class                     Cutoff (ng/mL) Amphetamine and metabolites    1000 Barbiturate and metabolites    200 Benzodiazepine                 [id number] Tricyclics and metabolites     300 Opiates and metabolites        300 Cocaine  and metabolites        300 THC                            50 Performed at [facility], [place] [address], [place], [place] [address]   Resp Panel by RT-PCR (Flu A&B, Covid) Nasopharyngeal Swab     Status: None   Collection Time: 04/30/20  8:04 PM   Specimen: Nasopharyngeal Swab; Nasopharyngeal(NP) swabs in vial transport medium  Result Value Ref Range   SARS Coronavirus 2 by RT PCR NEGATIVE NEGATIVE    Comment: (NOTE) SARS-CoV-2 target nucleic acids are NOT DETECTED.  The SARS-CoV-2 RNA is generally detectable in upper respiratory specimens during the acute phase of infection. The lowest concentration of SARS-CoV-2 viral copies this assay can detect is 138 copies/mL. A negative result does not preclude SARS-Cov-2 infection and should not be used as the sole basis for treatment or other patient management decisions. A negative result may occur with  improper specimen collection/handling, submission of specimen other than nasopharyngeal swab, presence of viral mutation(s) within the areas targeted by this assay, and inadequate number of viral copies(<138 copies/mL). A negative result must be combined with clinical observations, patient history, and  epidemiological information. The expected result is Negative.  Fact Sheet for Patients:  EntrepreneurPulse.com.au  Fact Sheet for Healthcare Providers:  IncredibleEmployment.be  This test is no t yet approved or cleared by the [place] FDA and  has been authorized for detection and/or diagnosis of SARS-CoV-2 by FDA under an Emergency Use Authorization (EUA). This EUA will remain  in effect (meaning this test can be used) for the duration of the COVID-19 declaration under Section 564(b)(1) of the Act, 21 U.S.C.section 360bbb-3(b)(1), unless the authorization is terminated  or revoked sooner.       Influenza A by PCR NEGATIVE NEGATIVE   Influenza B by PCR NEGATIVE NEGATIVE    Comment: (NOTE) The Xpert Xpress SARS-CoV-2/FLU/RSV plus assay is intended as an aid in the diagnosis of influenza from Nasopharyngeal swab specimens and should not be used as a sole basis for treatment. Nasal washings and aspirates are unacceptable for Xpert Xpress SARS-CoV-2/FLU/RSV testing.  Fact Sheet for Patients: EntrepreneurPulse.com.au  Fact Sheet for Healthcare Providers: IncredibleEmployment.be  This test is not yet approved or cleared by the [place] FDA and has been authorized for detection and/or diagnosis of SARS-CoV-2 by FDA under an Emergency Use Authorization (EUA). This EUA will remain in effect (meaning this test can be used) for the duration of the COVID-19 declaration under Section 564(b)(1) of the Act, 21 U.S.C. section 360bbb-3(b)(1), unless the authorization is terminated or revoked.  Performed at [facility], [place] [address] [place], [place] [address]   Comprehensive metabolic panel     Status: Abnormal   Collection Time: 04/30/20  8:04 PM  Result Value Ref Range   Sodium 142 135 - 145 mmol/L   Potassium 3.6 3.5 - 5.1 mmol/L   Chloride 109 98 - 111 mmol/L   CO2 24 22 - 32  mmol/L   Glucose, Bld 96 70 - 99 mg/dL    Comment: Glucose reference range applies only to samples taken after fasting for at least 8 hours.   BUN 8 6 - 20 mg/dL   Creatinine, Ser 0.66 0.44 - 1.00 mg/dL   Calcium 9.4 8.9 - 10.3 mg/dL   Total Protein 7.4 6.5 - 8.1 g/dL   Albumin 4.4 3.5 - 5.0  g/dL   AST 21 15 - 41 U/L   ALT 19 0 - 44 U/L   Alkaline Phosphatase 34 (L) 38 - 126 U/L   Total Bilirubin 0.5 0.3 - 1.2 mg/dL   GFR, Estimated >60 >60 mL/min    Comment: (NOTE) Calculated using the CKD-EPI Creatinine Equation (2021)    Anion gap 9 5 - 15    Comment: Performed at [facility], [place] [address] [place], [place] [address]  Ethanol     Status: None   Collection Time: 04/30/20  8:04 PM  Result Value Ref Range   Alcohol, Ethyl (B) <10 <10 mg/dL    Comment: (NOTE) Lowest detectable limit for serum alcohol is 10 mg/dL.  For medical purposes only. Performed at [facility], [place] [address], [place], [place] [address]   CBC with Diff     Status: Abnormal   Collection Time: 04/30/20  8:04 PM  Result Value Ref Range   WBC 11.4 (H) 4.0 - 10.5 K/uL   RBC 4.50 3.87 - 5.11 MIL/uL   Hemoglobin 11.5 (L) 12.0 - 15.0 g/dL   HCT 37.9 36.0 - 46.0 %   MCV 84.2 80.0 - 100.0 fL   MCH 25.6 (L) 26.0 - 34.0 pg   MCHC 30.3 30.0 - 36.0 g/dL   RDW 15.7 (H) 11.5 - 15.5 %   Platelets 268 150 - 400 K/uL   nRBC 0.0 0.0 - 0.2 %   Neutrophils Relative % 57 %   Neutro Abs 6.5 1.7 - 7.7 K/uL   Lymphocytes Relative 33 %   Lymphs Abs 3.7 0.7 - 4.0 K/uL   Monocytes Relative 5 %   Monocytes Absolute 0.6 0.1 - 1.0 K/uL   Eosinophils Relative 4 %   Eosinophils Absolute 0.4 0.0 - 0.5 K/uL   Basophils Relative 1 %   Basophils Absolute 0.1 0.0 - 0.1 K/uL   Immature Granulocytes 0 %   Abs Immature Granulocytes 0.03 0.00 - 0.07 K/uL    Comment: Performed at [facility], [place] [address], [place], [place] [address]  Acetaminophen level     Status:  Abnormal   Collection Time: 04/30/20  8:04 PM  Result Value Ref Range   Acetaminophen (Tylenol), Serum <10 (L) 10 - 30 ug/mL    Comment: (NOTE) Therapeutic concentrations vary significantly. A range of 10-30 ug/mL  may be an effective concentration for many patients. However, some  are best treated at concentrations outside of this range. Acetaminophen concentrations >150 ug/mL at 4 hours after ingestion  and >50 ug/mL at 12 hours after ingestion are often associated with  toxic reactions.  Performed at [facility], [place] [address], [place], [place] [id number]   Salicylate level     Status: Abnormal   Collection Time: 04/30/20  8:04 PM  Result Value Ref Range   Salicylate Lvl [id number] (L) 7.0 - 30.0 mg/dL    Comment: Performed at [facility], [place] [address], [place], [place] [address]  I-Stat beta hCG blood, ED     Status: None   Collection Time: 04/30/20  8:14 PM  Result Value Ref Range   I-stat hCG, quantitative <5.0 <5 mIU/mL   Comment 3            Comment:   GEST. AGE      CONC.  (mIU/mL)   <=1 WEEK        5 - 50     2 WEEKS  50 - 500     3 WEEKS       100 - 10,000     4 WEEKS     1,000 - 30,000        FEMALE AND NON-PREGNANT FEMALE:     LESS THAN 5 mIU/mL     Medications:  Current Facility-Administered Medications  Medication Dose Route Frequency Provider Last Rate Last Admin  . acetaminophen (TYLENOL) tablet 650 mg  650 mg Oral Q4H PRN Robinson, [place] N, PA-C   650 mg at 04/30/20 2217  . ondansetron (ZOFRAN) tablet 4 mg  4 mg Oral Q8H PRN Robinson, [place] N, PA-C       Current Outpatient Medications  Medication Sig Dispense Refill  . gabapentin (NEURONTIN) 300 MG capsule Take 300 mg by mouth once.    Marland Kitchen glipiZIDE (GLUCOTROL XL) 10 MG 24 hr tablet Take 10 mg by mouth once.    . lamoTRIgine (LAMICTAL) 100 MG tablet Take 100 mg by mouth once.    Marland Kitchen LEVEMIR FLEXTOUCH 100 UNIT/ML FlexPen Inject 40 Units into the skin 2 (two) times  daily.     Marland Kitchen lithium carbonate (ESKALITH) 450 MG CR tablet Take 2 tablets (900 mg total) by mouth at bedtime. 60 tablet 2  . perphenazine (TRILAFON) 2 MG tablet Take 3 tablets (6 mg total) by mouth 2 (two) times daily.    . propranolol (INDERAL) 10 MG tablet Take 1 tablet (10 mg total) by mouth 2 (two) times daily. 60 tablet 2  . simvastatin (ZOCOR) 20 MG tablet Take 20 mg by mouth at bedtime.    . vitamin B-12 (CYANOCOBALAMIN) 1000 MCG tablet Take 1,000 mcg by mouth daily.    . ABILIFY MAINTENA 400 MG PRSY prefilled syringe SMARTSIG:1 Each IM Once a Month    . ARIPiprazole ER (ABILIFY MAINTENA) 400 MG SRER injection Inject 2 mLs (400 mg total) into the muscle every 28 (twenty-eight) days. (Patient not taking: Reported on 04/30/2020) 1 each 11  . haloperidol decanoate (HALDOL DECANOATE) 50 MG/ML injection Inject 50 mg into the muscle every 28 (twenty-eight) days.    . insulin detemir (LEVEMIR) 100 UNIT/ML injection Inject 0.4 mLs (40 Units total) into the skin 2 (two) times daily. (Patient not taking: No sig reported) 10 mL 1  . ondansetron (ZOFRAN ODT) 4 MG disintegrating tablet Take 1 tablet (4 mg total) by mouth every 8 (eight) hours as needed for nausea or vomiting. 5 tablet 0    Musculoskeletal: Strength & Muscle Tone: within normal limits Gait & Station: normal Patient leans: N/A  Psychiatric Specialty Exam: Physical Exam Vitals and nursing note reviewed. Chaperone present: Sitter at bedside.  Constitutional:      General: She is not in acute distress.    Appearance: Normal appearance. She is not ill-appearing.  HENT:     Head: Normocephalic.  Eyes:     Pupils: Pupils are equal, round, and reactive to light.  Cardiovascular:     Rate and Rhythm: Normal rate.  Pulmonary:     Effort: Pulmonary effort is normal.  Musculoskeletal:        General: Normal range of motion.     Cervical back: Normal range of motion.  Neurological:     Mental Status: She is alert and oriented to  person, place, and time.  Psychiatric:        Attention and Perception: Attention and perception normal. She does not perceive auditory or visual hallucinations.        Mood and Affect:  Mood is depressed. Affect is flat.        Speech: Speech normal.        Behavior: Behavior normal. Behavior is cooperative.        Thought Content: Thought content is not paranoid or delusional. Thought content includes suicidal ideation. Thought content does not include homicidal ideation. Thought content includes suicidal plan.        Cognition and Memory: Cognition and memory normal.        Judgment: Judgment is impulsive.     Review of Systems  Constitutional: Positive for fatigue.  HENT: Negative.   Eyes: Negative.   Respiratory: Negative.   Cardiovascular: Negative.   Gastrointestinal: Negative.   Genitourinary: Negative.   Musculoskeletal: Negative.   Skin: Negative.   Neurological: Negative.   Hematological: Negative.   Psychiatric/Behavioral: Positive for suicidal ideas. Negative for agitation and hallucinations. The patient is nervous/anxious.        Patient states she continues to be depressed; unhappy an if she goes home she will take another overdose because this one did not work.  States she was trying to kill herself because "I just want to end my life"     Blood pressure 140/83, pulse 82, temperature (!) 97.5 F (36.4 C), temperature source Oral, resp. rate 16, last menstrual period 04/10/2020, SpO2 99 %.There is no height or weight on file to calculate BMI.  General Appearance: Casual  Eye Contact:  Good  Speech:  Blocked and Normal Rate  Volume:  Normal  Mood:  Depressed and Hopeless  Affect:  Congruent and Depressed  Thought Process:  Coherent, Goal Directed and Descriptions of Associations: Intact  Orientation:  Full (Time, Place, and Person)  Thought Content:  WDL  Suicidal Thoughts:  Yes.  with intent/plan  Homicidal Thoughts:  No  Memory:  Immediate;   Good Recent;    Good  Judgement:  Fair  Insight:  Fair  Psychomotor Activity:  Normal  Concentration:  Concentration: Good and Attention Span: Good  Recall:  Good  Fund of Knowledge:  Fair  Language:  Fair  Akathisia:  No  Handed:  Right  AIMS (if indicated):     Assets:  Communication Skills Desire for Improvement Housing  ADL's:  Intact  Cognition:  WNL  Sleep:        Treatment Plan Summary: Daily contact with patient to assess and evaluate symptoms and progress in treatment, Medication management and Plan Inpatient psychiatric treatment  Disposition: Recommend psychiatric Inpatient admission when medically cleared.  This service was provided via telemedicine using a 2-way, interactive audio and video technology.  Names of all persons participating in this telemedicine service and their role in this encounter. Name: Assunta Found Role: NP  Name: Dr. Nelly Rout Role: Psychiatrist  Name: [name] Role: Patient  Name:  Role:    Spoke with Dr. Rubin Payor via telephone informed of disposition  Lelan Cush, NP 05/01/2020 11:00 AM

## 2020-05-01 NOTE — ED Notes (Signed)
Report given to Lyman, Charity fundraiser. Patient has 2 belongings bags, tagged with her stickers, placed in cabinets designated for pt belongings for room 1-4. Pt currently in [place].

## 2020-05-02 ENCOUNTER — Other Ambulatory Visit: Payer: Self-pay

## 2020-05-02 ENCOUNTER — Encounter (HOSPITAL_COMMUNITY): Payer: Self-pay | Admitting: Psychiatry

## 2020-05-02 ENCOUNTER — Inpatient Hospital Stay (HOSPITAL_COMMUNITY)
Admission: AD | Admit: 2020-05-02 | Discharge: 2020-05-05 | DRG: 885 | Disposition: A | Discharge status: Other Acute Inpatient Hospital | Payer: Medicaid Other | Source: Intra-hospital | Attending: Emergency Medicine | Admitting: Emergency Medicine

## 2020-05-02 DIAGNOSIS — I10 Essential (primary) hypertension: Secondary | ICD-10-CM | POA: Diagnosis present

## 2020-05-02 DIAGNOSIS — F429 Obsessive-compulsive disorder, unspecified: Secondary | ICD-10-CM | POA: Diagnosis present

## 2020-05-02 DIAGNOSIS — R45851 Suicidal ideations: Secondary | ICD-10-CM

## 2020-05-02 DIAGNOSIS — Z6841 Body Mass Index (BMI) 40.0 and over, adult: Secondary | ICD-10-CM | POA: Diagnosis not present

## 2020-05-02 DIAGNOSIS — Z79899 Other long term (current) drug therapy: Secondary | ICD-10-CM | POA: Diagnosis not present

## 2020-05-02 DIAGNOSIS — E785 Hyperlipidemia, unspecified: Secondary | ICD-10-CM | POA: Diagnosis present

## 2020-05-02 DIAGNOSIS — R4183 Borderline intellectual functioning: Secondary | ICD-10-CM | POA: Diagnosis present

## 2020-05-02 DIAGNOSIS — Z20822 Contact with and (suspected) exposure to covid-19: Secondary | ICD-10-CM | POA: Diagnosis present

## 2020-05-02 DIAGNOSIS — E1151 Type 2 diabetes mellitus with diabetic peripheral angiopathy without gangrene: Secondary | ICD-10-CM | POA: Diagnosis present

## 2020-05-02 DIAGNOSIS — F639 Impulse disorder, unspecified: Secondary | ICD-10-CM | POA: Diagnosis present

## 2020-05-02 DIAGNOSIS — U071 COVID-19: Secondary | ICD-10-CM | POA: Diagnosis not present

## 2020-05-02 DIAGNOSIS — F17200 Nicotine dependence, unspecified, uncomplicated: Secondary | ICD-10-CM | POA: Diagnosis present

## 2020-05-02 DIAGNOSIS — F411 Generalized anxiety disorder: Secondary | ICD-10-CM | POA: Diagnosis present

## 2020-05-02 DIAGNOSIS — Z7984 Long term (current) use of oral hypoglycemic drugs: Secondary | ICD-10-CM | POA: Diagnosis not present

## 2020-05-02 DIAGNOSIS — E1142 Type 2 diabetes mellitus with diabetic polyneuropathy: Secondary | ICD-10-CM | POA: Diagnosis present

## 2020-05-02 DIAGNOSIS — E669 Obesity, unspecified: Secondary | ICD-10-CM | POA: Diagnosis present

## 2020-05-02 DIAGNOSIS — F314 Bipolar disorder, current episode depressed, severe, without psychotic features: Secondary | ICD-10-CM | POA: Diagnosis present

## 2020-05-02 DIAGNOSIS — Z833 Family history of diabetes mellitus: Secondary | ICD-10-CM | POA: Diagnosis not present

## 2020-05-02 DIAGNOSIS — Z888 Allergy status to other drugs, medicaments and biological substances status: Secondary | ICD-10-CM

## 2020-05-02 DIAGNOSIS — F250 Schizoaffective disorder, bipolar type: Principal | ICD-10-CM | POA: Diagnosis present

## 2020-05-02 DIAGNOSIS — F332 Major depressive disorder, recurrent severe without psychotic features: Secondary | ICD-10-CM | POA: Diagnosis present

## 2020-05-02 DIAGNOSIS — Z8249 Family history of ischemic heart disease and other diseases of the circulatory system: Secondary | ICD-10-CM | POA: Diagnosis not present

## 2020-05-02 DIAGNOSIS — K219 Gastro-esophageal reflux disease without esophagitis: Secondary | ICD-10-CM | POA: Diagnosis present

## 2020-05-02 DIAGNOSIS — E559 Vitamin D deficiency, unspecified: Secondary | ICD-10-CM | POA: Diagnosis present

## 2020-05-02 DIAGNOSIS — Z880 Allergy status to penicillin: Secondary | ICD-10-CM

## 2020-05-02 DIAGNOSIS — Z9151 Personal history of suicidal behavior: Secondary | ICD-10-CM | POA: Diagnosis not present

## 2020-05-02 DIAGNOSIS — E119 Type 2 diabetes mellitus without complications: Secondary | ICD-10-CM

## 2020-05-02 DIAGNOSIS — F17210 Nicotine dependence, cigarettes, uncomplicated: Secondary | ICD-10-CM | POA: Diagnosis present

## 2020-05-02 DIAGNOSIS — R4189 Other symptoms and signs involving cognitive functions and awareness: Secondary | ICD-10-CM | POA: Diagnosis present

## 2020-05-02 DIAGNOSIS — F819 Developmental disorder of scholastic skills, unspecified: Secondary | ICD-10-CM | POA: Diagnosis present

## 2020-05-02 LAB — LAMOTRIGINE LEVEL: Lamotrigine Lvl: 6 ug/mL (ref 2.0–20.0)

## 2020-05-02 LAB — GLUCOSE, CAPILLARY: Glucose-Capillary: 160 mg/dL — ABNORMAL HIGH (ref 70–99)

## 2020-05-02 MED ORDER — RISPERIDONE 1 MG PO TABS
1.0000 mg | ORAL_TABLET | Freq: Every day | ORAL | Status: DC
  Administered 2020-05-03: 1 mg via ORAL
  Filled 2020-05-02 (×3): qty 1

## 2020-05-02 MED ORDER — DIPHENHYDRAMINE HCL 50 MG/ML IJ SOLN
50.0000 mg | Freq: Once | INTRAMUSCULAR | Status: DC
  Filled 2020-05-02: qty 1

## 2020-05-02 MED ORDER — NICOTINE POLACRILEX 2 MG MT GUM: 2.0000 mg | CHEWING_GUM | OROMUCOSAL | Status: DC | PRN

## 2020-05-02 MED ORDER — PROPRANOLOL HCL 20 MG PO TABS
10.0000 mg | ORAL_TABLET | Freq: Two times a day (BID) | ORAL | Status: DC
  Administered 2020-05-02 – 2020-05-04 (×6): 10 mg via ORAL
  Filled 2020-05-02 (×11): qty 1

## 2020-05-02 MED ORDER — HALOPERIDOL 5 MG PO TABS: 5.0000 mg | ORAL_TABLET | Freq: Four times a day (QID) | ORAL | Status: DC | PRN

## 2020-05-02 MED ORDER — LORAZEPAM 2 MG/ML IJ SOLN
INTRAMUSCULAR | Status: AC
  Filled 2020-05-02: qty 1

## 2020-05-02 MED ORDER — DIPHENHYDRAMINE HCL 50 MG/ML IJ SOLN
25.0000 mg | Freq: Four times a day (QID) | INTRAMUSCULAR | Status: DC | PRN
  Administered 2020-05-04: 25 mg via INTRAMUSCULAR
  Filled 2020-05-02: qty 1

## 2020-05-02 MED ORDER — WHITE PETROLATUM EX OINT: TOPICAL_OINTMENT | CUTANEOUS | Status: DC | PRN

## 2020-05-02 MED ORDER — LITHIUM CARBONATE ER 450 MG PO TBCR
900.0000 mg | EXTENDED_RELEASE_TABLET | Freq: Every day | ORAL | Status: DC
Start: 2020-05-02 — End: 2020-05-03
  Administered 2020-05-02: 900 mg via ORAL
  Filled 2020-05-02 (×3): qty 2

## 2020-05-02 MED ORDER — LORAZEPAM 2 MG/ML IJ SOLN: 2.0000 mg | Freq: Once | INTRAMUSCULAR | Status: DC

## 2020-05-02 MED ORDER — HALOPERIDOL LACTATE 5 MG/ML IJ SOLN
5.0000 mg | Freq: Four times a day (QID) | INTRAMUSCULAR | Status: DC | PRN
  Administered 2020-05-04: 5 mg via INTRAMUSCULAR
  Filled 2020-05-02: qty 1

## 2020-05-02 MED ORDER — ALUM & MAG HYDROXIDE-SIMETH 200-200-20 MG/5ML PO SUSP: 30.0000 mL | ORAL | Status: DC | PRN

## 2020-05-02 MED ORDER — HALOPERIDOL LACTATE 5 MG/ML IJ SOLN
10.0000 mg | Freq: Once | INTRAMUSCULAR | Status: AC
  Filled 2020-05-02: qty 2

## 2020-05-02 MED ORDER — RISPERIDONE 2 MG PO TABS
2.0000 mg | ORAL_TABLET | Freq: Every day | ORAL | Status: DC
Start: 2020-05-02 — End: 2020-05-04
  Administered 2020-05-02: 2 mg via ORAL
  Filled 2020-05-02 (×4): qty 1

## 2020-05-02 MED ORDER — HALOPERIDOL LACTATE 5 MG/ML IJ SOLN
INTRAMUSCULAR | Status: AC
  Administered 2020-05-02: 10 mg via INTRAMUSCULAR
  Filled 2020-05-02: qty 2

## 2020-05-02 MED ORDER — TRAZODONE HCL 100 MG PO TABS
50.0000 mg | ORAL_TABLET | Freq: Every evening | ORAL | Status: DC | PRN
  Administered 2020-05-02 – 2020-05-03 (×2): 50 mg via ORAL
  Filled 2020-05-02 (×2): qty 1

## 2020-05-02 MED ORDER — DIPHENHYDRAMINE HCL 50 MG/ML IJ SOLN
INTRAMUSCULAR | Status: AC
  Administered 2020-05-02: 50 mg
  Filled 2020-05-02: qty 1

## 2020-05-02 MED ORDER — MAGNESIUM HYDROXIDE 400 MG/5ML PO SUSP: 30.0000 mL | Freq: Every day | ORAL | Status: DC | PRN

## 2020-05-02 MED ORDER — LAMOTRIGINE 100 MG PO TABS
100.0000 mg | ORAL_TABLET | Freq: Every day | ORAL | Status: DC
  Administered 2020-05-02 – 2020-05-03 (×2): 100 mg via ORAL
  Filled 2020-05-02 (×4): qty 1

## 2020-05-02 MED ORDER — ONDANSETRON 4 MG PO TBDP
4.0000 mg | ORAL_TABLET | Freq: Three times a day (TID) | ORAL | Status: DC | PRN
  Administered 2020-05-04 (×2): 4 mg via ORAL
  Filled 2020-05-02 (×2): qty 1

## 2020-05-02 MED ORDER — INSULIN DETEMIR 100 UNIT/ML ~~LOC~~ SOLN
40.0000 [IU] | Freq: Two times a day (BID) | SUBCUTANEOUS | Status: DC
  Administered 2020-05-02 – 2020-05-04 (×5): 40 [IU] via SUBCUTANEOUS
  Filled 2020-05-02 (×4): qty 0.4

## 2020-05-02 MED ORDER — ACETAMINOPHEN 325 MG PO TABS
650.0000 mg | ORAL_TABLET | Freq: Four times a day (QID) | ORAL | Status: DC | PRN
  Administered 2020-05-03 – 2020-05-04 (×2): 650 mg via ORAL
  Filled 2020-05-02 (×2): qty 2

## 2020-05-02 MED ORDER — HYDROXYZINE HCL 25 MG PO TABS
25.0000 mg | ORAL_TABLET | Freq: Three times a day (TID) | ORAL | Status: DC | PRN
  Administered 2020-05-03: 25 mg via ORAL
  Filled 2020-05-02 (×3): qty 1

## 2020-05-02 MED ORDER — SIMVASTATIN 20 MG PO TABS
20.0000 mg | ORAL_TABLET | Freq: Every day | ORAL | Status: DC
  Administered 2020-05-02 – 2020-05-03 (×2): 20 mg via ORAL
  Filled 2020-05-02 (×4): qty 1

## 2020-05-02 MED ORDER — LORAZEPAM 2 MG/ML IJ SOLN
2.0000 mg | Freq: Four times a day (QID) | INTRAMUSCULAR | Status: DC | PRN
  Administered 2020-05-02 – 2020-05-04 (×2): 2 mg via INTRAMUSCULAR
  Filled 2020-05-02: qty 1

## 2020-05-02 NOTE — H&P (Signed)
Psychiatric Admission Assessment Adult  Patient Identification: [name] MRN:  [id number] Date of Evaluation:  05/02/2020 Chief Complaint:  MDD (major depressive disorder), recurrent severe, without psychosis ([place]) [F33.2] Principal Diagnosis: Schizoaffective disorder, bipolar type ([place]) Diagnosis:  Principal Problem:   Schizoaffective disorder, bipolar type ([place]) Active Problems:   Suicidal ideation   Borderline intellectual functioning   Learning disability   Hyperlipidemia   Bipolar I disorder, most recent episode depressed, severe without psychotic features ([place])   HTN (hypertension)   Tobacco use disorder  History of Present Illness: [name] is a 30 yo female patient diagnosed with schizoaffective disorder, bipolar type, Learning disability, HLD, SI, Tobacco use disorder. Patient presented to [facility] after being brought by [employer]. Patient reports that she called the [employer] as she felt that she was going to attempt suicide by OD on her pills. Patient reports that she has been feeling depressed and lonely at home and reports that she really wants to be in a group home. Patient reports that she recently got a new ACTT team because she did not like her old ACTT team because she felt they were not listening to her request for a group home. Patient reports that she has also recently started haldol IM that her ACTT has been giving her for the past 2 months but patient reports TD symptoms and this has been bothering her. Patient reports that if she were to leave she would either go home and overdose or call the cops again. Patient does not endorse and HI nor AVH. Patient does report that she has been out on the streets more in an attempt to not be at home "staring at the walls" however, she has "enemies" in the street. Patient also reports that she has been going to her moms home and her mother has made it clear that she is not welcome in the home. Patient reports that she and her mother get in  arguments frequently.  Associated Signs/Symptoms: Depression Symptoms:  hopelessness, suicidal thoughts with specific plan, Duration of Depression Symptoms: Greater than two weeks  (Hypo) Manic Symptoms:  Labiality of Mood, Anxiety Symptoms:  Anxious about returning home Psychotic Symptoms:  none at this time Duration of Psychotic Symptoms: No data recorded PTSD Symptoms: NA Total Time spent with patient: 30 minutes  Past Psychiatric History: Patient has been diagnosed with Bipolar 1 disorder and schizoaffective disorder. Patient has an ACTT team and reports seeing Dr. Jake [name] on this team. Patient reports that she was in a group home in the past. Patient also reports that she has tried the following medications but had failure or negative side effects: Abilify, Seroqeul, Cymbalta, Buspar, Haldol PO, and Wellbutrin. Patient is seen frequently in the ED and at [facility]. Patient was last admitted 5/18-2021-09/13/2019 w/d Bipolar 2, depressed w/ psychotic features, 07/29/2019-07/30/2019,06/2019 and multiple times before that. Patient EMR goes back to multiple hospitalization at [facility] in 2006.  Is the patient at risk to self? Yes.    Has the patient been a risk to self in the past 6 months? Yes.    Has the patient been a risk to self within the distant past? Yes.    Is the patient a risk to others? No.  Has the patient been a risk to others in the past 6 months? No.  Has the patient been a risk to others within the distant past? No.   Prior Inpatient Therapy:   Prior Outpatient Therapy:    Alcohol Screening: 1. How often do you  have a drink containing alcohol?: 2 to 3 times a week 2. How many drinks containing alcohol do you have on a typical day when you are drinking?: 1 or 2 3. How often do you have six or more drinks on one occasion?: Never AUDIT-C Score: 3 4. How often during the last year have you found that you were not able to stop drinking once you had started?: Never 5. How often during the last  year have you failed to do what was normally expected from you because of drinking?: Never 6. How often during the last year have you needed a first drink in the morning to get yourself going after a heavy drinking session?: Never 7. How often during the last year have you had a feeling of guilt of remorse after drinking?: Never 8. How often during the last year have you been unable to remember what happened the night before because you had been drinking?: Never 9. Have you or someone else been injured as a result of your drinking?: No 10. Has a relative or friend or a doctor or another health worker been concerned about your drinking or suggested you cut down?: No Alcohol Use Disorder Identification Test Final Score (AUDIT): 3 Alcohol Brief Interventions/Follow-up: AUDIT Score <7 follow-up not indicated Substance Abuse History in the last 12 months:  Yes.   Patient smokes 3ppd. No Etoh or other illicit substance use. Consequences of Substance Abuse: NA Previous Psychotropic Medications: Yes  Psychological Evaluations: Yes  Past Medical History:  Past Medical History:  Diagnosis Date  . Anxiety   . Bipolar 1 disorder ([place])   . Cognitive deficits   . Depression   . Diabetes mellitus without complication ([place])   . Hypertension   . Mental disorder   . Mental health disorder   . Obesity     Past Surgical History:  Procedure Laterality Date  . CESAREAN SECTION    . CESAREAN SECTION N/A 04/25/2013   Procedure: REPEAT CESAREAN SECTION;  Surgeon: [name], MD;  Location: [facility];  Service: Obstetrics;  Laterality: N/A;  . MASS EXCISION N/A 06/03/2012   Procedure: EXCISION MASS;  Surgeon: [name], MD;  Location: [place];  Service: ENT;  Laterality: N/A;  Excision uvula mass  . TONSILLECTOMY N/A 06/03/2012   Procedure: TONSILLECTOMY;  Surgeon: [name], MD;  Location: [place];  Service: ENT;  Laterality: N/A;  . TONSILLECTOMY     Family  History:  Family History  Problem Relation Age of Onset  . Hypertension Mother   . Diabetes Father    Family Psychiatric  History: Unknown Tobacco Screening: Have you used any form of tobacco in the last 30 days? (Cigarettes, Smokeless Tobacco, Cigars, and/or Pipes): Yes Tobacco use, Select all that apply: cigar use daily Are you interested in Tobacco Cessation Medications?: No, patient refused Counseled patient on smoking cessation including recognizing danger situations, developing coping skills and basic information about quitting provided: Refused/Declined practical counseling Social History:  Social History   Substance and Sexual Activity  Alcohol Use Not Currently   Comment: occ: last intake 4 mts ago     Social History   Substance and Sexual Activity  Drug Use Not Currently  . Types: "Crack" cocaine, Other-see comments   Comment: Patient reports hx of smoking Crack    Additional Social History:  Allergies:   Allergies  Allergen Reactions  . Wellbutrin [Bupropion] Shortness Of Breath  . Omnipaque [Iohexol] Swelling and Other (See Comments)    Reaction:  Eye swelling  . Penicillins Hives and Other (See Comments)    Has patient had a PCN reaction causing immediate rash, facial/tongue/throat swelling, SOB or lightheadedness with hypotension: Unknown Has patient had a PCN reaction causing severe rash involving mucus membranes or skin necrosis: Yes Has patient had a PCN reaction that required hospitalization Unknown Has patient had a PCN reaction occurring within the last 10 years: No If all of the above answers are "NO", then may proceed with Cephalosporin use.  Herma Mering [Benztropine]     Make pt feel crazy  . Depakote Er [Divalproex Sodium Er] Nausea And Vomiting   Lab Results:  Results for orders placed or performed during the hospital encounter of 05/02/20 (from the past 48 hour(s))  Glucose, capillary     Status: Abnormal    Collection Time: 05/02/20  8:05 AM  Result Value Ref Range   Glucose-Capillary 160 (H) 70 - 99 mg/dL    Comment: Glucose reference range applies only to [name] taken after fasting for at least 8 hours.    Blood Alcohol level:  Lab Results  Component Value Date   ETH <10 04/30/2020   ETH <10 [id number]    Metabolic Disorder Labs:  Lab Results  Component Value Date   HGBA1C 6.3 (H) 10/10/2019   MPG 134.11 10/10/2019   MPG 122.63 07/14/2019   Lab Results  Component Value Date   PROLACTIN 5.3 05/19/2019   PROLACTIN 5.2 10/11/2018   Lab Results  Component Value Date   CHOL 141 07/14/2019   TRIG 76 07/14/2019   HDL 49 07/14/2019   CHOLHDL 2.9 07/14/2019   VLDL 15 07/14/2019   LDLCALC 77 07/14/2019   LDLCALC 85 06/23/2019    Current Medications: Current Facility-Administered Medications  Medication Dose Route Frequency Provider Last Rate Last Admin  . acetaminophen (TYLENOL) tablet 650 mg  650 mg Oral Q6H PRN Prescilla Sours, PA-C      . alum & mag hydroxide-simeth (MAALOX/MYLANTA) 200-200-20 MG/5ML suspension 30 mL  30 mL Oral Q4H PRN Prescilla Sours, PA-C      . hydrOXYzine (ATARAX/VISTARIL) tablet 25 mg  25 mg Oral TID PRN Prescilla Sours, PA-C      . insulin detemir (LEVEMIR) injection 40 Units  40 Units Subcutaneous BID Prescilla Sours, PA-C   40 Units at 05/02/20 1045  . magnesium hydroxide (MILK OF MAGNESIA) suspension 30 mL  30 mL Oral Daily PRN [name] W, PA-C      . nicotine polacrilex (NICORETTE) gum 2 mg  2 mg Oral PRN Damita Dunnings B, MD      . propranolol (INDERAL) tablet 10 mg  10 mg Oral BID [name] W, PA-C   10 mg at 05/02/20 1044  . simvastatin (ZOCOR) tablet 20 mg  20 mg Oral QHS [name], Cody W, PA-C      . traZODone (DESYREL) tablet 50 mg  50 mg Oral QHS PRN [name] W, PA-C      . white petrolatum (VASELINE) gel   Topical PRN Freida Busman, MD       PTA Medications: Medications Prior to Admission  Medication Sig Dispense Refill Last Dose   . ARIPiprazole ER (ABILIFY MAINTENA) 400 MG SRER injection Inject 2 mLs (400 mg total) into the muscle every 28 (twenty-eight) days. 1 each 11   .  BANOPHEN 25 MG capsule Take 25 mg by mouth at bedtime.     . benztropine (COGENTIN) 1 MG tablet Take 1 mg by mouth 2 (two) times daily.     . cholecalciferol (VITAMIN D) 25 MCG (1000 UNIT) tablet Take 1,000 Units by mouth daily.     Marland Kitchen gabapentin (NEURONTIN) 300 MG capsule Take 300 mg by mouth once.     Marland Kitchen glipiZIDE (GLUCOTROL XL) 10 MG 24 hr tablet Take 10 mg by mouth once.     . haloperidol decanoate (HALDOL DECANOATE) 50 MG/ML injection Inject 50 mg into the muscle every 28 (twenty-eight) days.     Marland Kitchen lamoTRIgine (LAMICTAL) 100 MG tablet Take 100 mg by mouth once.     Marland Kitchen LEVEMIR FLEXTOUCH 100 UNIT/ML FlexPen Inject 40 Units into the skin 2 (two) times daily.      Marland Kitchen lithium carbonate (ESKALITH) 450 MG CR tablet Take 2 tablets (900 mg total) by mouth at bedtime. 60 tablet 2   . omeprazole (PRILOSEC) 20 MG capsule Take 20 mg by mouth daily.     . ondansetron (ZOFRAN ODT) 4 MG disintegrating tablet Take 1 tablet (4 mg total) by mouth every 8 (eight) hours as needed for nausea or vomiting. 5 tablet 0   . perphenazine (TRILAFON) 2 MG tablet Take 3 tablets (6 mg total) by mouth 2 (two) times daily.     . propranolol (INDERAL) 10 MG tablet Take 1 tablet (10 mg total) by mouth 2 (two) times daily. 60 tablet 2   . simvastatin (ZOCOR) 20 MG tablet Take 20 mg by mouth at bedtime.     . vitamin B-12 (CYANOCOBALAMIN) 1000 MCG tablet Take 1,000 mcg by mouth daily.       Musculoskeletal: Strength & Muscle Tone: within normal limits Gait & Station: patient remains in bed on exam Patient leans: N/A  Psychiatric Specialty Exam: Physical Exam  Review of Systems  Blood pressure 131/84, pulse 85, temperature (!) 97.1 F (36.2 C), temperature source Oral, resp. rate 18, height 5\' 7"  (1.702 m), weight 117.5 kg, last menstrual period 04/10/2020.Body mass index is 40.57  kg/m.  General Appearance: Casual  Eye Contact:  Good  Speech:  Clear and Coherent  Volume:  Normal  Mood:  Dysphoric  Affect:  Blunt and Tearful  Thought Process:  Linear  Orientation:  Full (Time, Place, and Person)  Thought Content:  Illogical patient thinks an appropriate reaction to not being in a group home is to commit suicide. Patient is not really able to think of any other ways to cope.  Suicidal Thoughts:  Yes.  with intent/plan to go home and OD  Homicidal Thoughts:  No  Memory:  Recent;   Fair  Judgement:  Poor  Insight:  Shallow  Psychomotor Activity:  Normal  Concentration:  Concentration: Fair  Recall:  Poor  Fund of Knowledge:  Fair  Language:  Fair  Akathisia:  No  Handed:  Right  AIMS (if indicated):     Assets:  Desire for Improvement Resilience  ADL's:  Intact  Cognition:  WNL  Sleep:       Treatment Plan Summary: Daily contact with patient to assess and evaluate symptoms and progress in treatment [name] is a 29yo patient w/ hx of schizoaffective disorder, bipolar type. Patient endorses SI and reports being unable to cope with living alone. Patient also endorses TD with the addition of her haldol. Patient reported that her haldol IM should be due, but we will not continue  and will instead start patient on risperdal with the understanding that patient is T2DM. Will monitor patient BGL and currently awaiting patient's A1c. Patient also has known HLD currently awaiting patient's lipids. Will also restart patient;s home lithium and lamictal. Patient reports taking them 1 day prior to admission. Will monitor patient for endorsing new AVH and work with SW to help find a safe dispo for patient.   Schizoaffective disorder, bipolar type -Stop Haldol IM - Start risperdal 1mg  and 2mg  QHS - Continue home Lithium 900 QHS - Continue home lamictal 100mg  - Propanolol 10mg  BID, continue home  Tobacco use disorder - Nicorette gum PRN  HLD -Continue home simvastatin  20mg  QHS  T2DM - Start levemir 40U  PRN -Tylenol 650mg  q6h, pain -Maalox 34ml q4h, indigestion -Atarax 25mg  TID, anxiety -Milk of Mag 82mL, constipation -Trazodone 50mg  QHS, insomnia Observation Level/Precautions:  15 minute checks  Laboratory:  Chemistry Profile  Psychotherapy:    Medications:    Consultations:    Discharge Concerns:    Estimated LOS:  Other:     Physician Treatment Plan for Primary Diagnosis: Schizoaffective disorder, bipolar type ([place]) Long Term Goal(s): Improvement in symptoms so as ready for discharge  Short Term Goals: Ability to verbalize feelings will improve, Ability to disclose and discuss suicidal ideas, Ability to demonstrate self-control will improve and Ability to identify and develop effective coping behaviors will improve  Physician Treatment Plan for Secondary Diagnosis: Principal Problem:   Schizoaffective disorder, bipolar type ([place]) Active Problems:   Suicidal ideation   Borderline intellectual functioning   Learning disability   Hyperlipidemia   Bipolar I disorder, most recent episode depressed, severe without psychotic features ([facility])   HTN (hypertension)   Tobacco use disorder  Long Term Goal(s): Improvement in symptoms so as ready for discharge  Short Term Goals: Ability to identify changes in lifestyle to reduce recurrence of condition will improve, Ability to verbalize feelings will improve, Ability to identify and develop effective coping behaviors will improve and Ability to identify triggers associated with substance abuse/mental health issues will improve  I certify that inpatient services furnished can reasonably be expected to improve the patient's condition.   PGY-1 Freida Busman, MD 1/6/20222:20 PM

## 2020-05-02 NOTE — ED Notes (Signed)
Report given to [facility] at [facility] and [facility] called for transportation to [facility]

## 2020-05-02 NOTE — Progress Notes (Signed)
Dar Note: Patient was calm and interacting with peers in the dayroom earlier during the day.  Patient received her medication at 5:30 pm but stated that her medication is not strong enough.  Patient informed that her provider will be notified about her concern.  Patient agreed and walked to her room.  Few minutes later patient started yelling and screaming.  Stated "you bitches not giving me the help that I need."  " I'm tired of coming to the hospital."  Attempted to redirect patient but continues with the outburst and had a few induced vomiting episodes.  MD made aware and assessed patient.  Haldol 10 mg, Ativan 2 mg and Benadryl 50 mg IM given as prescribed for agitation with good effect.

## 2020-05-02 NOTE — Tx Team (Signed)
Initial Treatment Plan 05/02/2020 6:10 AM [name] [id number]    PATIENT STRESSORS: Marital or family conflict Medication change or noncompliance   PATIENT STRENGTHS: General fund of knowledge Motivation for treatment/growth   PATIENT IDENTIFIED PROBLEMS: Risk for suicide  Depression  "getting in group home"                 DISCHARGE CRITERIA:  Adequate post-discharge living arrangements Improved stabilization in mood, thinking, and/or behavior Verbal commitment to aftercare and medication compliance  PRELIMINARY DISCHARGE PLAN: Attend PHP/IOP Attend 12-step recovery group Outpatient therapy  PATIENT/FAMILY INVOLVEMENT: This treatment plan has been presented to and reviewed with the patient, [name].  The patient and family have been given the opportunity to ask questions and make suggestions.  Delos Haring, RN 05/02/2020, 6:10 AM

## 2020-05-02 NOTE — Progress Notes (Signed)
Recreation Therapy Notes  Date: 1.6.22 Time: 1000 Location: 400 Hall Dayroom  Group Topic: Communication, Team Building, Problem Solving  Goal Area(s) Addresses:  Patient will effectively work with peer towards shared goal.  Patient will identify skill used to make activity successful.  Patient will identify how skills used during activity can be used to reach post d/c goals.   Behavioral Response: Engaged  Intervention: STEM Activity   Activity: Straw Bridge.  In groups, patients were given 15 straws and 74ft of tape.  Patients were to build a free standing bridge that could hold a small puzzle box.  Education: Pharmacist, community, Building control surveyor.   Education Outcome: Acknowledges education  Clinical Observations/Feedback: Pt was attentive and engaged.  Pt worked with and followed along with peers on the idea they came up with.  Pt expressed she had a learning disability and it was hard for her to catch on to the activity.  Pt was bright and pleasant throughout group session.    Caroll Rancher, LRT/CTRS    Caroll Rancher A 05/02/2020 11:23 AM

## 2020-05-02 NOTE — Plan of Care (Signed)
I was called to see the patient for agitation on the unit. She was Energy manager, cursing at staff, and pounding on the door in response to concern over her scheduled medications. The patient was verbally redirected and de-escalated verbally by staff and was willing to voluntarily take IM Haldol 10mg , IM Ativan 2mg , and Benadryl 50mg  IM with prompts by staff. She was escorted to her room where she was encouraged to rest.   , MD, 

## 2020-05-02 NOTE — BHH Suicide Risk Assessment (Signed)
[facility] Admission Suicide Risk Assessment   Nursing information obtained from:  Patient Demographic factors:  Living alone,Low socioeconomic status Current Mental Status:  Self-harm thoughts,Suicidal ideation indicated by patient,Suicide plan Loss Factors:  Financial problems / change in socioeconomic status Historical Factors:  Prior suicide attempts Risk Reduction Factors:  NA  Total Time spent with patient: 30 minutes Principal Problem: Schizoaffective disorder, bipolar type ([facility]) Diagnosis:  Principal Problem:   Schizoaffective disorder, bipolar type ([facility]) Active Problems:   Suicidal ideation   Borderline intellectual functioning   Learning disability   Hyperlipidemia   HTN (hypertension)   Tobacco use disorder  Subjective Data: 30 year old woman with bipolar Illness and history of numerous inpatient hospitalizations presenting with auditory hallucinations in context of medication non-compliance.  Continued Clinical Symptoms:  Alcohol Use Disorder Identification Test Final Score (AUDIT): 3 The "Alcohol Use Disorders Identification Test", Guidelines for Use in Primary Care, Second Edition.  World Science writer [facility]). Score between 0-7:  no or low risk or alcohol related problems. Score between 8-15:  moderate risk of alcohol related problems. Score between 16-19:  high risk of alcohol related problems. Score 20 or above:  warrants further diagnostic evaluation for alcohol dependence and treatment.   CLINICAL FACTORS:   Bipolar Disorder:   Mixed State   See HPI for mental status exam    COGNITIVE FEATURES THAT CONTRIBUTE TO RISK:  Thought constriction (tunnel vision)    SUICIDE RISK:   Moderate:  Frequent suicidal ideation with limited intensity, and duration, some specificity in terms of plans, no associated intent, good self-control, limited dysphoria/symptomatology, some risk factors present, and identifiable protective factors, including available and accessible  social support.  PLAN OF CARE: Continue care on inpatient basis  I certify that inpatient services furnished can reasonably be expected to improve the patient's condition.   Clement Sayres, MD 05/02/2020, 3:14 PM

## 2020-05-02 NOTE — Progress Notes (Signed)
Patient admitted to unit on 2.6.22 . Due to admission within last year, no new recreation therapy assessment conducted at this time. Last assessment conducted 2.3.2021. Patient reports stressors as wanting to be in another group home and her family.   Patient stated reason for admission was overdosing on medication.  Patient expressed isolation, self injury, music, meditation, art, prayer and walking as coping skills.  Patient identified leisure interest as socializing, going to the fish market and sleeping.  Patient expressed the park and fish market were her community resources.  Patient identified strengths as having fun, doing hair and liking to laugh.  Areas of improvement were identified as stop stressing/overdosing and stop suicidal thoughts.    Patient reports goal of "go to a group home".  Patient reports SI as a 10 but contracts for safety.  Patient denies HI and AVH.    Caroll Rancher, LRT/CTRS

## 2020-05-02 NOTE — Plan of Care (Signed)
Patient reassessed after earlier IM injection and noted to be sleeping in room with eyes closed - she awakens to her name and denies any physical complaints or anxiety at this time.   Bartholomew Crews, MD, Celene Skeen

## 2020-05-02 NOTE — Progress Notes (Signed)
Pt is accepted by Antony Blackbird NP to room 500-1. Please call report to [phone].

## 2020-05-02 NOTE — Progress Notes (Signed)
Pt  been in her room much of the evening, pt calm with the assistance of medications given earlier in the day. Pt encouraged to talk to the doctor about the Haldol. Pt stated that is the medication that caused to have a lot of problems before she came in.     05/02/20 2100  Psych Admission Type (Psych Patients Only)  Admission Status Voluntary  Psychosocial Assessment  Patient Complaints Anxiety  Eye Contact Fair  Facial Expression Anxious  Affect Anxious;Labile  Speech Logical/coherent  Interaction Assertive  Motor Activity Slow  Appearance/Hygiene Disheveled;Body odor  Behavior Characteristics Cooperative  Mood Labile;Pleasant  Thought Process  Coherency Disorganized  Content WDL  Delusions None reported or observed  Perception WDL  Hallucination None reported or observed  Judgment Poor  Confusion None  Danger to Self  Current suicidal ideation? Denies  Self-Injurious Behavior No self-injurious ideation or behavior indicators observed or expressed   Agreement Not to Harm Self Yes  Description of Agreement verbal contract for safety  Danger to Others  Danger to Others None reported or observed
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
## 2020-05-02 NOTE — [facility] Suicide Risk Assessment (Signed)
[facility] INPATIENT:  Family/Significant Other Suicide Prevention Education  Suicide Prevention Education:  Patient Refusal for Family/Significant Other Suicide Prevention Education: The patient [name] was unable to provide written consent for family/significant other to be provided Family/Significant Other Suicide Prevention Education during admission and/or prior to discharge.  Physician notified. SRA completed with pt and flyer placed in pt chart to share with supports post-discharge.   Felizardo Hoffmann 05/02/2020, 10:02 AM

## 2020-05-02 NOTE — [facility] Counselor (Signed)
CSW was provided a number [phone] by the pt that she stated is for her new ACTT team but she was unsure of the name. CSW contacted this number and reached Judeth Cornfield with [name] and learned that pt is currently being transitioned to their team from [facility]. Caller stated that they received the rest of her paperwork from [facility] this morning and would submit her paperwork to start services today. CSW informed the ACTT team that pt is currently at [facility] and CSW will continue to keep them update as a discharge plan progresses. CSW spoke with the ACTT team about the pt's goal of getting a group home (not in [place]) and [place] stated that pt currently lives in a federal grant housing and is only three months into the lease and she may not be able to get out of it at this time. CSW thanked the ACTT team for their information and stated that they would keep them up to date.   Fredirick Lathe, LCSWA Clinicial Social Worker [employer]

## 2020-05-02 NOTE — Progress Notes (Signed)
MD made attempt at approx 3pm to get in contact with [facility]. MD did correspond with someone on the ACTT Crisis Hotline and left her number in an effort to find out patient's last Haldol injection. MD had not heard back at the time of writing this note. Will continue to treat patient with the assumption that she is due soon as the patient reported she is due for her next injection soon.

## 2020-05-02 NOTE — Progress Notes (Signed)
Patient ID: [name], female   DOB: 1990/05/07, 30 y.o.   MRN: [id number]  Admission Note:  30 yr female who presents IVC in no acute distress for the treatment of SI and Depression. Pt appears flat and depressed. Pt was calm and cooperative with admission process. Pt presents with passive SI and contracts for safety upon admission. Pt denies AVH . Pt stated she was D/C from [facility] and they put her on Haldol "that was the wrong medicine" pt stated she began shaking and decompensating , crying and wanting to die. Pt stated she  OD on her medications : 1500mg  of gabapentin and double the prescription dose of lamictal, propanolol, and her anti-psychotic.   Per Assessment:  On evaluation [name] reports she is feeling depressed and angry.  States that he medications are not working.  Reports she has a ACTT team and psychiatrist but "They ain't doing nothing.  I need to have my medications changed casue they ain't working."  Patient states see ACTT team once a week when to bring on her medications.  Patient also reports that she was recently discharged from inpatient psychiatric treatment where her medications were changed but they are still not working.  Patient also states one of her stressors is not liking the apartment she is living in and wanting to get into a group home in [facility].  Patient is unable to contract for safety.  States that she is discharged home she will take another overdose. During evaluation [name] is slightly elevated up in bed in no acute distress.  She is alert, oriented x 4, calm and cooperative.  Her mood is depressed and with congruent affect.  She does not appear to be responding to internal/external stimuli or delusional thoughts.  Patient denies homicidal ideation, psychosis, and paranoia.  Patient answered question appropriately.     A: Skin was assessed and found to be clear of any abnormal marks  PT searched and no  contraband found, POC and unit policies explained and understanding verbalized. Consents obtained. Food and fluids offered, and fluids accepted.   R:Pt had no additional questions or concerns.

## 2020-05-03 LAB — LIPID PANEL
Cholesterol: 150 mg/dL (ref 0–200)
HDL: 37 mg/dL — ABNORMAL LOW (ref >40)
LDL Cholesterol: 74 mg/dL (ref 0–99)
Total CHOL/HDL Ratio: 4.1 RATIO
Triglycerides: 196 mg/dL — ABNORMAL HIGH (ref <150)
VLDL: 39 mg/dL (ref 0–40)

## 2020-05-03 LAB — HEMOGLOBIN A1C
Hgb A1c MFr Bld: 5.6 % (ref 4.8–5.6)
Mean Plasma Glucose: 114.02 mg/dL

## 2020-05-03 LAB — TSH: TSH: 1.052 u[IU]/mL (ref 0.350–4.500)

## 2020-05-03 LAB — GLUCOSE, CAPILLARY: Glucose-Capillary: 145 mg/dL — ABNORMAL HIGH (ref 70–99)

## 2020-05-03 LAB — LITHIUM LEVEL: Lithium Lvl: 0.47 mmol/L — ABNORMAL LOW (ref 0.60–1.20)

## 2020-05-03 MED ORDER — RISPERIDONE 2 MG PO TABS
2.0000 mg | ORAL_TABLET | Freq: Every day | ORAL | Status: DC
Start: 2020-05-04 — End: 2020-05-04
  Filled 2020-05-03 (×2): qty 1

## 2020-05-03 MED ORDER — LITHIUM CARBONATE ER 300 MG PO TBCR
600.0000 mg | EXTENDED_RELEASE_TABLET | Freq: Two times a day (BID) | ORAL | Status: DC
  Administered 2020-05-03 – 2020-05-04 (×2): 600 mg via ORAL
  Filled 2020-05-03 (×6): qty 2

## 2020-05-03 NOTE — Progress Notes (Signed)
[facility] MD Progress Note  05/03/2020 3:56 PM [name]  MRN:  [id number] Subjective: Per members of care team yesterday afternoon patient became very agitated slamming weighted chairs, vomiting, and screaming about her medications and not seeing a doctor. Patient required Haldol 10mg , IM Ativan 2mg , and Benadryl 50mg  IM. Patient responded well to the intervention. This AM patient reported that she slept well but had bothersome dreams about an ex-boyfriend. Patient reports that she has been having these bothersome dreams a lot lately. Patient was asked why she became so agitated yesterday and responded it was because she felt her medications were not right. Writer asked patient if she knew more appropriate ways to express her displeasures and patient responded that she should have talked with someone about her concerns. It was than re explained to patient that she had in fact had medication changes, but it was too early in the evening to received her medication when she had her episode. It was also reexpand to patient that SW had been notified that they would not be able to help her get a group home because her ACTT team will have to help her. Patient eventually expressed understanding that she will not be discharging to group home when she medically stable. Patient did endorse SI today and reported that her plan remained unchanged. Patient did report that her thoughts of suicide had lessened. Patient did not endorse HI nor AVH. Patient reports that she is overall feeling better today. Principal Problem: Schizoaffective disorder, bipolar type ([place]) Diagnosis: Principal Problem:   Schizoaffective disorder, bipolar type ([place]) Active Problems:   Suicidal ideation   Borderline intellectual functioning   Learning disability   Hyperlipidemia   HTN (hypertension)   Tobacco use disorder  Total Time spent with patient: 30 minutes  Past Psychiatric History: See H&P  Past Medical History:  Past Medical  History:  Diagnosis Date  . Anxiety   . Bipolar 1 disorder ([place])   . Cognitive deficits   . Depression   . Diabetes mellitus without complication ([place])   . Hypertension   . Mental disorder   . Mental health disorder   . Obesity     Past Surgical History:  Procedure Laterality Date  . CESAREAN SECTION    . CESAREAN SECTION N/A 04/25/2013   Procedure: REPEAT CESAREAN SECTION;  Surgeon: [name], MD;  Location: [facility];  Service: Obstetrics;  Laterality: N/A;  . MASS EXCISION N/A 06/03/2012   Procedure: EXCISION MASS;  Surgeon: [name], MD;  Location: [facility];  Service: ENT;  Laterality: N/A;  Excision uvula mass  . TONSILLECTOMY N/A 06/03/2012   Procedure: TONSILLECTOMY;  Surgeon: [name], MD;  Location: [facility];  Service: ENT;  Laterality: N/A;  . TONSILLECTOMY     Family History:  Family History  Problem Relation Age of Onset  . Hypertension Mother   . Diabetes Father    Family Psychiatric  History: See H&P Social History:  Social History   Substance and Sexual Activity  Alcohol Use Not Currently   Comment: occ: last intake 4 mts ago     Social History   Substance and Sexual Activity  Drug Use Not Currently  . Types: "Crack" cocaine, Other-see comments   Comment: Patient reports hx of smoking Crack    Social History   Socioeconomic History  . Marital status: Single    Spouse name: Not on file  . Number of children: Not on file  . Years of education:  Not on file  . Highest education level: Not on file  Occupational History  . Not on file  Tobacco Use  . Smoking status: Current Every Day Smoker    Packs/day: 4.00    Years: 11.00    Pack years: 44.00    Types: Cigarettes, Cigars  . Smokeless tobacco: Never Used  . Tobacco comment: Pt declined  Vaping Use  . Vaping Use: Never used  Substance and Sexual Activity  . Alcohol use: Not Currently    Comment: occ: last intake 4 mts ago  . Drug use: Not  Currently    Types: "Crack" cocaine, Other-see comments    Comment: Patient reports hx of smoking Crack  . Sexual activity: Yes    Birth control/protection: Implant  Other Topics Concern  . Not on file  Social History Narrative   ** Merged History Encounter **       Social Determinants of Health   Financial Resource Strain: Not on file  Food Insecurity: Not on file  Transportation Needs: Not on file  Physical Activity: Not on file  Stress: Not on file  Social Connections: Not on file   Additional Social History:                         Sleep: Fair  Appetite:  Good  Current Medications: Current Facility-Administered Medications  Medication Dose Route Frequency Provider Last Rate Last Admin  . acetaminophen (TYLENOL) tablet 650 mg  650 mg Oral Q6H PRN Prescilla Sours, PA-C      . alum & mag hydroxide-simeth (MAALOX/MYLANTA) 200-200-20 MG/5ML suspension 30 mL  30 mL Oral Q4H PRN [name] W, PA-C      . diphenhydrAMINE (BENADRYL) injection 25 mg  25 mg Intramuscular Q6H PRN Nelda Marseille, Amy E, MD      . diphenhydrAMINE (BENADRYL) injection 50 mg  50 mg Intramuscular Once Nelda Marseille, Amy E, MD      . haloperidol (HALDOL) tablet 5 mg  5 mg Oral Q6H PRN Harlow Asa, MD       Or  . haloperidol lactate (HALDOL) injection 5 mg  5 mg Intramuscular Q6H PRN Nelda Marseille, Amy E, MD      . hydrOXYzine (ATARAX/VISTARIL) tablet 25 mg  25 mg Oral TID PRN Prescilla Sours, PA-C      . insulin detemir (LEVEMIR) injection 40 Units  40 Units Subcutaneous BID Prescilla Sours, PA-C   40 Units at 05/02/20 1741  . lithium carbonate (ESKALITH) CR tablet 900 mg  900 mg Oral QHS Damita Dunnings B, MD   900 mg at 05/02/20 2046  . lithium carbonate (LITHOBID) CR tablet 600 mg  600 mg Oral Q12H Leyan Branden B, MD      . LORazepam (ATIVAN) injection 2 mg  2 mg Intramuscular Once Nelda Marseille, Amy E, MD      . LORazepam (ATIVAN) injection 2 mg  2 mg Intramuscular Q6H PRN Harlow Asa, MD   2 mg at  05/02/20 1755  . magnesium hydroxide (MILK OF MAGNESIA) suspension 30 mL  30 mL Oral Daily PRN [name] W, PA-C      . nicotine polacrilex (NICORETTE) gum 2 mg  2 mg Oral PRN Damita Dunnings B, MD      . ondansetron (ZOFRAN-ODT) disintegrating tablet 4 mg  4 mg Oral Q8H PRN Nelda Marseille, Amy E, MD      . propranolol (INDERAL) tablet 10 mg  10 mg Oral BID [name]  W, PA-C   10 mg at 05/03/20 1212  . risperiDONE (RISPERDAL) tablet 2 mg  2 mg Oral QHS Damita Dunnings B, MD   2 mg at 05/02/20 2052  . [START ON 05/04/2020] risperiDONE (RISPERDAL) tablet 2 mg  2 mg Oral Daily Grayland Daisey B, MD      . simvastatin (ZOCOR) tablet 20 mg  20 mg Oral QHS [name] W, PA-C   20 mg at 05/02/20 2052  . traZODone (DESYREL) tablet 50 mg  50 mg Oral QHS PRN Prescilla Sours, PA-C   50 mg at 05/02/20 2053  . white petrolatum (VASELINE) gel   Topical PRN Freida Busman, MD        Lab Results:  Results for orders placed or performed during the hospital encounter of 05/02/20 (from the past 48 hour(s))  Glucose, capillary     Status: Abnormal   Collection Time: 05/02/20  8:05 AM  Result Value Ref Range   Glucose-Capillary 160 (H) 70 - 99 mg/dL    Comment: Glucose reference range applies only to samples taken after fasting for at least 8 hours.  Hemoglobin A1c     Status: None   Collection Time: 05/03/20  6:42 AM  Result Value Ref Range   Hgb A1c MFr Bld 5.6 4.8 - 5.6 %    Comment: (NOTE) Pre diabetes:          5.7%-6.4%  Diabetes:              >6.4%  Glycemic control for   <7.0% adults with diabetes    Mean Plasma Glucose 114.02 mg/dL    Comment: Performed at [facility] [address] [place], [place] [address]  Lipid panel     Status: Abnormal   Collection Time: 05/03/20  6:42 AM  Result Value Ref Range   Cholesterol 150 0 - 200 mg/dL   Triglycerides 196 (H) <150 mg/dL   HDL 37 (L) >40 mg/dL   Total CHOL/HDL Ratio 4.1 RATIO   VLDL 39 0 - 40 mg/dL   LDL Cholesterol 74 0 - 99 mg/dL     Comment:        Total Cholesterol/HDL:CHD Risk Coronary Heart Disease Risk Table                     Men   Women  1/2 Average Risk   3.4   3.3  Average Risk       5.0   4.4  2 X Average Risk   9.6   7.1  3 X Average Risk  23.4   11.0        Use the calculated Patient Ratio above and the CHD Risk Table to determine the patient's CHD Risk.        ATP III CLASSIFICATION (LDL):  <100     mg/dL   Optimal  100-129  mg/dL   Near or Above                    Optimal  130-159  mg/dL   Borderline  160-189  mg/dL   High  >190     mg/dL   Very High Performed at [facility] [address], [place],  [address]   TSH     Status: None   Collection Time: 05/03/20  6:42 AM  Result Value Ref Range   TSH 1.052 0.350 - 4.500 uIU/mL    Comment: Performed by a 3rd  Generation assay with a functional sensitivity of <=0.01 uIU/mL. Performed at [facility], [place] [address], [place], [place] [address]   Lithium level     Status: Abnormal   Collection Time: 05/03/20  6:42 AM  Result Value Ref Range   Lithium Lvl 0.47 (L) 0.60 - 1.20 mmol/L    Comment: Performed at [facility], [place] [address], [place], [place] [address]    Blood Alcohol level:  Lab Results  Component Value Date   [facility] <10 04/30/2020   ETH <10 65/06/5463    Metabolic Disorder Labs: Lab Results  Component Value Date   HGBA1C 5.6 05/03/2020   MPG 114.02 05/03/2020   MPG 134.11 10/10/2019   Lab Results  Component Value Date   PROLACTIN 5.3 05/19/2019   PROLACTIN 5.2 10/11/2018   Lab Results  Component Value Date   CHOL 150 05/03/2020   TRIG 196 (H) 05/03/2020   HDL 37 (L) 05/03/2020   CHOLHDL 4.1 05/03/2020   VLDL 39 05/03/2020   LDLCALC 74 05/03/2020   LDLCALC 77 07/14/2019    Physical Findings: AIMS: Facial and Oral Movements Muscles of Facial Expression: None, normal Lips and Perioral Area: None, normal Jaw: None, normal Tongue: None,  normal,Extremity Movements Upper (arms, wrists, hands, fingers): None, normal Lower (legs, knees, ankles, toes): None, normal, Trunk Movements Neck, shoulders, hips: None, normal, Overall Severity Severity of abnormal movements (highest score from questions above): None, normal Incapacitation due to abnormal movements: None, normal Patient's awareness of abnormal movements (rate only patient's report): No Awareness, Dental Status Current problems with teeth and/or dentures?: No Does patient usually wear dentures?: No  CIWA:  CIWA-Ar Total: 0 COWS:  COWS Total Score: 1  Musculoskeletal: Strength & Muscle Tone: within normal limits Gait & Station: normal Patient leans: N/A  Psychiatric Specialty Exam: Physical Exam Constitutional:      Comments: Appears older than stated age  HENT:     Head: Normocephalic.  Pulmonary:     Effort: Pulmonary effort is normal.  Neurological:     Mental Status: She is alert.     Review of Systems  Cardiovascular: Negative for chest pain.  Gastrointestinal: Negative for abdominal pain.  Neurological: Negative for headaches.    Blood pressure 131/83, pulse 83, temperature (!) 97.1 F (36.2 C), temperature source Oral, resp. rate 18, height 5\' 7"  (1.702 m), weight 117.5 kg, last menstrual period 04/10/2020.Body mass index is 40.57 kg/m.  General Appearance: Casual  Eye Contact:  Fair  Speech:  Clear and Coherent  Volume:  Normal  Mood:  "a little better"\  Affect:  Blunt  Thought Process:  Linear  Orientation:  NA  Thought Content:  Rumination on medications  Suicidal Thoughts:  Yes.  with intent/plan at home not inpatient  Homicidal Thoughts:  No  Memory:  Recent;   Fair  Judgement:  Impaired  Insight:  Shallow  Psychomotor Activity:  Decreased  Concentration:  Concentration: Fair  Recall:  NA  Fund of Knowledge:  Poor  Language:  Fair  Akathisia:  No  Handed:  Right  AIMS (if indicated):     Assets:  Desire for  Improvement Resilience  ADL's:  Intact  Cognition:  Impaired,  Mild  Sleep:        Treatment Plan Summary: Daily contact with patient to assess and evaluate symptoms and progress in treatment  [name] is a 30yo patient w/ hx of schizoaffective disorder, bipolar type. Patient's episode yesterday is very concerning for impulsivity  and may also have been 2/2 her mild cognitive impairment. Patient continues to endorse SI and is struggling to cope with her emotions. Will increase Risperdal. Patient Lithium lvl 0.47 which is subtherapeutic will increase Li.   Was able to reach out to [name] who is patient's new ACTT physician. He disclosed that patient's old team closed and he has only seen the patient one time. He had plans to change medication; however patient went to [facility] the next day. [name] was concerned that patient's current IM Haldol was not enough and was considering increasing her to 150mg . He did recommend stopping her lamictal and mentioned that he discontinued perphenazine.  Schizoaffective disorder, bipolar type - Increase risperdal to 2mg  and 2mg  QHS - Increase Lithium 600 BID - discontinue home lamictal 100mg  - Propanolol 10mg  BID, continue home - Agitation protocol  Tobacco use disorder - Nicorette gum PRN  HLD -Continue home simvastatin 20mg  QHS  T2DM - Continue levemir 40U  PRN -Tylenol 650mg  q6h, pain -Maalox 62ml q4h, indigestion -Atarax 25mg  TID, anxiety -Milk of Mag 25mL, constipation -Trazodone 50mg  QHS, insomnia  PGY-1 Freida Busman, MD 05/03/2020, 3:56 PM

## 2020-05-03 NOTE — [facility] Group Notes (Signed)
[facility] LCSW Group Therapy  05/03/2020 1:35 PM  Type of Therapy:  Group Therapy: Stressors and Coping Skills  Participation Level:  Did Not Attend   Mliss Fritz 05/03/2020, 1:35 PM

## 2020-05-03 NOTE — Progress Notes (Signed)
Pt refused Risperdal , pt stated it was making her head hurt. Pt was informed to talk to the doctor

## 2020-05-03 NOTE — Progress Notes (Signed)
Recreation Therapy Notes  Date: 1.7.22 Time: 1000 Location: [address] Dayroom      Group Topic/Focus: Team Building/Communication  Goal Area(s) Addresses:  Patient will use appropriate interactions in play with peers.   Patient will follow directions on first prompt. Patient will work with peers towards desired goal.  Intervention: Marketing executive and Music  Activity: Keep It Chartered certified accountant.  Seated in a circle, patients were to pass the beach ball to each other.  Patients were to keep the ball in rotation without it coming to a stop.  Patients could bounce the ball off the or the wall if necessary to keep it moving.  LRT would count the number of hits were made on the ball.  If the ball came to a complete stop, the count would start over.  Clinical Observations/Feedback: Pt did not attend group session.     [name], LRT/CTRS         [name] A 05/03/2020 12:11 PM

## 2020-05-03 NOTE — Tx Team (Signed)
Interdisciplinary Treatment and Diagnostic Plan Update  05/03/2020 Time of Session: 9:35am [name] MRN: [id number]  Principal Diagnosis: Schizoaffective disorder, bipolar type ([place])  Secondary Diagnoses: Principal Problem:   Schizoaffective disorder, bipolar type ([place]) Active Problems:   Suicidal ideation   Borderline intellectual functioning   Learning disability   Hyperlipidemia   HTN (hypertension)   Tobacco use disorder   Current Medications:  Current Facility-Administered Medications  Medication Dose Route Frequency Provider Last Rate Last Admin  . acetaminophen (TYLENOL) tablet 650 mg  650 mg Oral Q6H PRN Prescilla Sours, PA-C      . alum & mag hydroxide-simeth (MAALOX/MYLANTA) 200-200-20 MG/5ML suspension 30 mL  30 mL Oral Q4H PRN [name] W, PA-C      . diphenhydrAMINE (BENADRYL) injection 25 mg  25 mg Intramuscular Q6H PRN Nelda Marseille, Amy E, MD      . diphenhydrAMINE (BENADRYL) injection 50 mg  50 mg Intramuscular Once Nelda Marseille, Amy E, MD      . haloperidol (HALDOL) tablet 5 mg  5 mg Oral Q6H PRN Harlow Asa, MD       Or  . haloperidol lactate (HALDOL) injection 5 mg  5 mg Intramuscular Q6H PRN Nelda Marseille, Amy E, MD      . hydrOXYzine (ATARAX/VISTARIL) tablet 25 mg  25 mg Oral TID PRN Prescilla Sours, PA-C      . insulin detemir (LEVEMIR) injection 40 Units  40 Units Subcutaneous BID Prescilla Sours, PA-C   40 Units at 05/02/20 1741  . lamoTRIgine (LAMICTAL) tablet 100 mg  100 mg Oral Daily Damita Dunnings B, MD   100 mg at 05/03/20 1212  . lithium carbonate (ESKALITH) CR tablet 900 mg  900 mg Oral QHS Damita Dunnings B, MD   900 mg at 05/02/20 2046  . LORazepam (ATIVAN) injection 2 mg  2 mg Intramuscular Once Nelda Marseille, Amy E, MD      . LORazepam (ATIVAN) injection 2 mg  2 mg Intramuscular Q6H PRN Harlow Asa, MD   2 mg at 05/02/20 1755  . magnesium hydroxide (MILK OF MAGNESIA) suspension 30 mL  30 mL Oral Daily PRN [name] W, PA-C      . nicotine  polacrilex (NICORETTE) gum 2 mg  2 mg Oral PRN Damita Dunnings B, MD      . ondansetron (ZOFRAN-ODT) disintegrating tablet 4 mg  4 mg Oral Q8H PRN Nelda Marseille, Amy E, MD      . propranolol (INDERAL) tablet 10 mg  10 mg Oral BID [name] W, PA-C   10 mg at 05/03/20 1212  . risperiDONE (RISPERDAL) tablet 2 mg  2 mg Oral QHS Damita Dunnings B, MD   2 mg at 05/02/20 2052  . [START ON 05/04/2020] risperiDONE (RISPERDAL) tablet 2 mg  2 mg Oral Daily McQuilla, Jai B, MD      . simvastatin (ZOCOR) tablet 20 mg  20 mg Oral QHS [name] W, PA-C   20 mg at 05/02/20 2052  . traZODone (DESYREL) tablet 50 mg  50 mg Oral QHS PRN Prescilla Sours, PA-C   50 mg at 05/02/20 2053  . white petrolatum (VASELINE) gel   Topical PRN Freida Busman, MD       PTA Medications: Medications Prior to Admission  Medication Sig Dispense Refill Last Dose  . ARIPiprazole ER (ABILIFY MAINTENA) 400 MG SRER injection Inject 2 mLs (400 mg total) into the muscle every 28 (twenty-eight) days. 1 each 11   . Michela Pitcher  25 MG capsule Take 25 mg by mouth at bedtime.     . benztropine (COGENTIN) 1 MG tablet Take 1 mg by mouth 2 (two) times daily.     . cholecalciferol (VITAMIN D) 25 MCG (1000 UNIT) tablet Take 1,000 Units by mouth daily.     Marland Kitchen gabapentin (NEURONTIN) 300 MG capsule Take 300 mg by mouth once.     Marland Kitchen glipiZIDE (GLUCOTROL XL) 10 MG 24 hr tablet Take 10 mg by mouth once.     . haloperidol decanoate (HALDOL DECANOATE) 50 MG/ML injection Inject 50 mg into the muscle every 28 (twenty-eight) days.     Marland Kitchen lamoTRIgine (LAMICTAL) 100 MG tablet Take 100 mg by mouth once.     Marland Kitchen LEVEMIR FLEXTOUCH 100 UNIT/ML FlexPen Inject 40 Units into the skin 2 (two) times daily.      Marland Kitchen lithium carbonate (ESKALITH) 450 MG CR tablet Take 2 tablets (900 mg total) by mouth at bedtime. 60 tablet 2   . omeprazole (PRILOSEC) 20 MG capsule Take 20 mg by mouth daily.     . ondansetron (ZOFRAN ODT) 4 MG disintegrating tablet Take 1 tablet (4 mg total) by mouth  every 8 (eight) hours as needed for nausea or vomiting. 5 tablet 0   . perphenazine (TRILAFON) 2 MG tablet Take 3 tablets (6 mg total) by mouth 2 (two) times daily.     . propranolol (INDERAL) 10 MG tablet Take 1 tablet (10 mg total) by mouth 2 (two) times daily. 60 tablet 2   . simvastatin (ZOCOR) 20 MG tablet Take 20 mg by mouth at bedtime.     . vitamin B-12 (CYANOCOBALAMIN) 1000 MCG tablet Take 1,000 mcg by mouth daily.       Patient Stressors: Marital or family conflict Medication change or noncompliance  Patient Strengths: Technical sales engineer for treatment/growth  Treatment Modalities: Medication Management, Group therapy, Case management,  1 to 1 session with clinician, Psychoeducation, Recreational therapy.   Physician Treatment Plan for Primary Diagnosis: Schizoaffective disorder, bipolar type ([place]) Long Term Goal(s): Improvement in symptoms so as ready for discharge Improvement in symptoms so as ready for discharge   Short Term Goals: Ability to verbalize feelings will improve Ability to disclose and discuss suicidal ideas Ability to demonstrate self-control will improve Ability to identify and develop effective coping behaviors will improve Ability to identify changes in lifestyle to reduce recurrence of condition will improve Ability to verbalize feelings will improve Ability to identify and develop effective coping behaviors will improve Ability to identify triggers associated with substance abuse/mental health issues will improve  Medication Management: Evaluate patient's response, side effects, and tolerance of medication regimen.  Therapeutic Interventions: 1 to 1 sessions, Unit Group sessions and Medication administration.  Evaluation of Outcomes: Not Met  Physician Treatment Plan for Secondary Diagnosis: Principal Problem:   Schizoaffective disorder, bipolar type ([place]) Active Problems:   Suicidal ideation   Borderline intellectual  functioning   Learning disability   Hyperlipidemia   HTN (hypertension)   Tobacco use disorder  Long Term Goal(s): Improvement in symptoms so as ready for discharge Improvement in symptoms so as ready for discharge   Short Term Goals: Ability to verbalize feelings will improve Ability to disclose and discuss suicidal ideas Ability to demonstrate self-control will improve Ability to identify and develop effective coping behaviors will improve Ability to identify changes in lifestyle to reduce recurrence of condition will improve Ability to verbalize feelings will improve Ability to identify and develop effective coping  behaviors will improve Ability to identify triggers associated with substance abuse/mental health issues will improve     Medication Management: Evaluate patient's response, side effects, and tolerance of medication regimen.  Therapeutic Interventions: 1 to 1 sessions, Unit Group sessions and Medication administration.  Evaluation of Outcomes: Not Met   RN Treatment Plan for Primary Diagnosis: Schizoaffective disorder, bipolar type ([place]) Long Term Goal(s): Knowledge of disease and therapeutic regimen to maintain health will improve  Short Term Goals: Ability to demonstrate self-control, Ability to verbalize feelings will improve and Ability to identify and develop effective coping behaviors will improve  Medication Management: RN will administer medications as ordered by provider, will assess and evaluate patient's response and provide education to patient for prescribed medication. RN will report any adverse and/or side effects to prescribing provider.  Therapeutic Interventions: 1 on 1 counseling sessions, Psychoeducation, Medication administration, Evaluate responses to treatment, Monitor vital signs and CBGs as ordered, Perform/monitor CIWA, COWS, AIMS and Fall Risk screenings as ordered, Perform wound care treatments as ordered.  Evaluation of Outcomes: Not  Met   LCSW Treatment Plan for Primary Diagnosis: Schizoaffective disorder, bipolar type ([place]) Long Term Goal(s): Safe transition to appropriate next level of care at discharge, Engage patient in therapeutic group addressing interpersonal concerns.  Short Term Goals: Engage patient in aftercare planning with referrals and resources, Increase ability to appropriately verbalize feelings and Facilitate acceptance of mental health diagnosis and concerns  Therapeutic Interventions: Assess for all discharge needs, 1 to 1 time with Social worker, Explore available resources and support systems, Assess for adequacy in community support network, Educate family and significant other(s) on suicide prevention, Complete Psychosocial Assessment, Interpersonal group therapy.  Evaluation of Outcomes: Not Met   Progress in Treatment: Attending groups: No. Participating in groups: No. Taking medication as prescribed: Yes. and No. Toleration medication: Yes. Family/Significant other contact made: Yes, individual(s) contacted:  [name] Patient understands diagnosis: No. Discussing patient identified problems/goals with staff: Yes. Medical problems stabilized or resolved: Yes. Denies suicidal/homicidal ideation: Yes. Issues/concerns per patient self-inventory: Yes. Other:   New problem(s) identified: No, Describe:  CSW will continue to assess  New Short Term/Long Term Goal(s):medication stabilization, elimination of SI thoughts, development of comprehensive mental wellness plan.  Patient Goals:  "to feel better"  Discharge Plan or Barriers: Patient recently admitted. CSW will continue to follow and assess for appropriate referrals and possible discharge planning.  Reason for Continuation of Hospitalization: Depression Medication stabilization Suicidal ideation  Estimated Length of Stay: 3-5 days  Attendees: Patient: [name] 05/03/2020   Physician: Dr. Claris Gower 05/03/2020   Nursing:   05/03/2020   RN Care Manager: 05/03/2020   Social Worker: Toney Reil, [place] 05/03/2020   Recreational Therapist:  05/03/2020   Other:  05/03/2020   Other:  05/03/2020   Other: 05/03/2020      Scribe for Treatment Team: Mliss Fritz, [place] 05/03/2020 1:21 PM

## 2020-05-03 NOTE — Progress Notes (Signed)
Adult Psychoeducational Group Note  Date:  05/03/2020 Time:  9:05 PM  Group Topic/Focus:  Wrap-Up Group:   The focus of this group is to help patients review their daily goal of treatment and discuss progress on daily workbooks.  Participation Level:  Minimal  Participation Quality:  Drowsy  Affect:  Anxious  Cognitive:  Disorganized and Confused  Insight: Limited  Engagement in Group:  Lacking, Limited and Poor  Modes of Intervention:  Discussion  Additional Comments:  Pt stated her goal for today was to focus on her treatment plan. Pt stated she felt she accomplished her goal today. Pt stated she talk with her doctor about her care today but did not get a chance to speak with her social worker, regarding her care today. Pt stated she took all her medication today from her providers. Pt stated been able to contact her mother today improved her overall day.Pt rated her overall day a 6 out of 10 today. Pt stated she felt better about herself today. Pt stated her appetite was pretty good today and she was able to attend all meals held today. Pt stated her sleep last night was good. Pt stated the goal for tonight was to get some rest. Pt stated she was in some physical pain. Pt stated she had a headache. Pt rated her pain level and 8. Pt deny auditory or visual hallucinations. Pt denies thoughts of harming others. Pt did admit to thoughts of harming herself. Pt stated the last time she had thoughts of harming herself was about 5 mins ago. Pt stated she was tired of dealing with all the pain, tired of living, and tired of how people treat her. Pt stated she could contract for safety. Pt nurse was updated on situation. Pt stated she would alert staff if anything changes.  Candy Sledge 05/03/2020, 9:05 PM

## 2020-05-04 ENCOUNTER — Inpatient Hospital Stay (HOSPITAL_COMMUNITY): Payer: Medicaid Other

## 2020-05-04 ENCOUNTER — Encounter (HOSPITAL_COMMUNITY): Payer: Self-pay | Admitting: Registered Nurse

## 2020-05-04 DIAGNOSIS — Z794 Long term (current) use of insulin: Secondary | ICD-10-CM | POA: Insufficient documentation

## 2020-05-04 DIAGNOSIS — E1142 Type 2 diabetes mellitus with diabetic polyneuropathy: Secondary | ICD-10-CM | POA: Diagnosis present

## 2020-05-04 DIAGNOSIS — K219 Gastro-esophageal reflux disease without esophagitis: Secondary | ICD-10-CM | POA: Diagnosis present

## 2020-05-04 DIAGNOSIS — E559 Vitamin D deficiency, unspecified: Secondary | ICD-10-CM | POA: Diagnosis present

## 2020-05-04 DIAGNOSIS — K648 Other hemorrhoids: Secondary | ICD-10-CM | POA: Insufficient documentation

## 2020-05-04 DIAGNOSIS — E1165 Type 2 diabetes mellitus with hyperglycemia: Secondary | ICD-10-CM | POA: Insufficient documentation

## 2020-05-04 DIAGNOSIS — G43009 Migraine without aura, not intractable, without status migrainosus: Secondary | ICD-10-CM | POA: Insufficient documentation

## 2020-05-04 DIAGNOSIS — R4189 Other symptoms and signs involving cognitive functions and awareness: Secondary | ICD-10-CM | POA: Insufficient documentation

## 2020-05-04 LAB — COMPREHENSIVE METABOLIC PANEL
ALT: 29 U/L (ref 0–44)
AST: 30 U/L (ref 15–41)
Albumin: 4.2 g/dL (ref 3.5–5.0)
Alkaline Phosphatase: 37 U/L — ABNORMAL LOW (ref 38–126)
Anion gap: 9 (ref 5–15)
BUN: 7 mg/dL (ref 6–20)
CO2: 26 mmol/L (ref 22–32)
Calcium: 9.3 mg/dL (ref 8.9–10.3)
Chloride: 105 mmol/L (ref 98–111)
Creatinine, Ser: 0.88 mg/dL (ref 0.44–1.00)
GFR, Estimated: >60 mL/min (ref >60)
Glucose, Bld: 109 mg/dL — ABNORMAL HIGH (ref 70–99)
Potassium: 4.4 mmol/L (ref 3.5–5.1)
Sodium: 140 mmol/L (ref 135–145)
Total Bilirubin: 0.3 mg/dL (ref 0.3–1.2)
Total Protein: 7.5 g/dL (ref 6.5–8.1)

## 2020-05-04 LAB — CBC WITH DIFFERENTIAL/PLATELET
Abs Immature Granulocytes: 0.02 10*3/uL (ref 0.00–0.07)
Basophils Absolute: 0 10*3/uL (ref 0.0–0.1)
Basophils Relative: 1 %
Eosinophils Absolute: 0.2 10*3/uL (ref 0.0–0.5)
Eosinophils Relative: 2 %
HCT: 40.1 % (ref 36.0–46.0)
Hemoglobin: 12.3 g/dL (ref 12.0–15.0)
Immature Granulocytes: 0 %
Lymphocytes Relative: 11 %
Lymphs Abs: 0.9 10*3/uL (ref 0.7–4.0)
MCH: 25.7 pg — ABNORMAL LOW (ref 26.0–34.0)
MCHC: 30.7 g/dL (ref 30.0–36.0)
MCV: 83.9 fL (ref 80.0–100.0)
Monocytes Absolute: 1 10*3/uL (ref 0.1–1.0)
Monocytes Relative: 12 %
Neutro Abs: 6.1 10*3/uL (ref 1.7–7.7)
Neutrophils Relative %: 74 %
Platelets: 250 10*3/uL (ref 150–400)
RBC: 4.78 MIL/uL (ref 3.87–5.11)
RDW: 15.6 % — ABNORMAL HIGH (ref 11.5–15.5)
WBC: 8.2 10*3/uL (ref 4.0–10.5)
nRBC: 0 % (ref 0.0–0.2)

## 2020-05-04 LAB — GLUCOSE, CAPILLARY
Glucose-Capillary: 132 mg/dL — ABNORMAL HIGH (ref 70–99)
Glucose-Capillary: 146 mg/dL — ABNORMAL HIGH (ref 70–99)

## 2020-05-04 LAB — RESP PANEL BY RT-PCR (RSV, FLU A&B, COVID)  RVPGX2
Influenza A by PCR: NEGATIVE
Influenza B by PCR: NEGATIVE
Resp Syncytial Virus by PCR: NEGATIVE
SARS Coronavirus 2 by RT PCR: POSITIVE — AB

## 2020-05-04 LAB — PROLACTIN: Prolactin: 32.1 ng/mL — ABNORMAL HIGH (ref 4.8–23.3)

## 2020-05-04 MED ORDER — LORAZEPAM 2 MG/ML IJ SOLN: 2.0000 mg | Freq: Three times a day (TID) | INTRAMUSCULAR | Status: DC | PRN

## 2020-05-04 MED ORDER — KETOROLAC TROMETHAMINE 30 MG/ML IJ SOLN: 15.0000 mg | Freq: Once | INTRAMUSCULAR | Status: DC

## 2020-05-04 MED ORDER — ONDANSETRON HCL 4 MG PO TABS
4.0000 mg | ORAL_TABLET | Freq: Once | ORAL | Status: DC
  Filled 2020-05-04: qty 1

## 2020-05-04 MED ORDER — GLIPIZIDE ER 10 MG PO TB24
10.0000 mg | ORAL_TABLET | Freq: Once | ORAL | Status: DC
  Filled 2020-05-04: qty 1

## 2020-05-04 MED ORDER — ACETAMINOPHEN 325 MG PO TABS
650.0000 mg | ORAL_TABLET | Freq: Once | ORAL | Status: AC
  Administered 2020-05-04: 650 mg via ORAL
  Filled 2020-05-04: qty 2

## 2020-05-04 MED ORDER — RISPERIDONE 2 MG PO TABS
4.0000 mg | ORAL_TABLET | Freq: Every day | ORAL | Status: DC
  Filled 2020-05-04 (×2): qty 2

## 2020-05-04 MED ORDER — INSULIN DETEMIR 100 UNIT/ML ~~LOC~~ SOLN
40.0000 [IU] | Freq: Two times a day (BID) | SUBCUTANEOUS | 11 refills | Status: DC
Start: 2020-05-05 — End: 2020-05-05

## 2020-05-04 MED ORDER — LORAZEPAM 1 MG PO TABS: 2.0000 mg | ORAL_TABLET | Freq: Three times a day (TID) | ORAL | Status: DC | PRN

## 2020-05-04 MED ORDER — PROPRANOLOL HCL 10 MG PO TABS: 10.0000 mg | ORAL_TABLET | Freq: Two times a day (BID) | ORAL | 0 refills | Status: DC

## 2020-05-04 MED ORDER — SODIUM CHLORIDE 0.9 % IV BOLUS: 1000.0000 mL | Freq: Once | INTRAVENOUS | Status: DC

## 2020-05-04 MED ORDER — LITHIUM CARBONATE ER 300 MG PO TBCR: 600.0000 mg | EXTENDED_RELEASE_TABLET | Freq: Two times a day (BID) | ORAL | 0 refills | Status: DC

## 2020-05-04 MED ORDER — ONDANSETRON 4 MG PO TBDP: 4.0000 mg | ORAL_TABLET | Freq: Three times a day (TID) | ORAL | 0 refills | Status: DC | PRN

## 2020-05-04 MED ORDER — RISPERIDONE 4 MG PO TABS: 4.0000 mg | ORAL_TABLET | Freq: Every day | ORAL | 0 refills | Status: DC

## 2020-05-04 NOTE — ED Notes (Signed)
Spoke with Dr. Leverne Humbles at [facility]. IVC rescinded per [facility]. Pt has been discharged from their facility

## 2020-05-04 NOTE — Progress Notes (Signed)
[facility] MD Progress Note  05/04/2020 9:07 AM [name]  MRN:  [id number] Subjective:  [name] is a 30 yr old female who presents with SI (overdose on pills). PPHx is significant for schizoaffective disorder, bipolar type, Learning disability, HLD, SI, Tobacco use disorder, she has had multiple admissions and currently followed by an ACT Team.  She reports that she is feeling nauseous today. She states that she has been feeling this way since admission. She states she gets cold and warm sometimes. She did have an episode of vomiting. She has Zofran ordered and will receive it.  She reports continuing to have SI with a plan (overdose on pills). She reports no HI and no AVH. She continues to Ruminate on getting into a group home. Discussed that her ACT Team is aware of her desire to get placement and is working on it. She seemed to be agreeable to this explanation. Discussed that we have made ajustments to her medications. She reports that her Risperdal is making her sleepy during the day. Discussed that Lithium helps with Suicidality and she was agreeable to continuing to take it. She has never been on a antidepressant so asked her about her thoughts of self harm. She reports it is not because of sadness but because she is angry. Discussed that we will continue to coordinate with her ACT Team about her injection medications. She had no other concerns at present.   Principal Problem: Schizoaffective disorder, bipolar type ([place]) Diagnosis: Principal Problem:   Schizoaffective disorder, bipolar type ([place]) Active Problems:   Generalized anxiety disorder   Suicidal ideation   Borderline intellectual functioning   Learning disability   Impulse control disorder   Diabetes mellitus ([place])   Hyperlipidemia   OCD (obsessive compulsive disorder)   HTN (hypertension)   Tobacco use disorder   Gastroesophageal reflux disease   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 2 diabetes mellitus ([place])   Vitamin D  deficiency  Total Time spent with patient: 20 minutes  Past Psychiatric History: Patient has been diagnosed with Bipolar 1 disorder and schizoaffective disorder. Patient has an ACTT team and reports seeing [name] on this team. Patient reports that she was in a group home in the past. Patient also reports that she has tried the following medications but had failure or negative side effects: Abilify, Seroqeul, Cymbalta, Buspar, Haldol PO, and Wellbutrin. Patient is seen frequently in the ED and at [facility]. Patient was last admitted 5/18-2021-09/13/2019 [name]/d Bipolar 2, depressed [name]/ psychotic features, 07/29/2019-07/30/2019,06/2019 and multiple times before that. Patient EMR goes back to multiple hospitalization at [facility] [place] in 2006.  Past Medical History:  Past Medical History:  Diagnosis Date  . Anxiety   . Bipolar 1 disorder ([place])   . Cognitive deficits   . Depression   . Diabetes mellitus without complication ([place])   . Hypertension   . Mental disorder   . Mental health disorder   . Obesity     Past Surgical History:  Procedure Laterality Date  . CESAREAN SECTION    . CESAREAN SECTION N/A 04/25/2013   Procedure: REPEAT CESAREAN SECTION;  Surgeon: [name], MD;  Location: [place] [facility];  Service: Obstetrics;  Laterality: N/A;  . MASS EXCISION N/A 06/03/2012   Procedure: EXCISION MASS;  Surgeon: [name], MD;  Location: [place];  Service: ENT;  Laterality: N/A;  Excision uvula mass  . TONSILLECTOMY N/A 06/03/2012   Procedure: TONSILLECTOMY;  Surgeon: [name], MD;  Location: [place];  Service:  ENT;  Laterality: N/A;  . TONSILLECTOMY     Family History:  Family History  Problem Relation Age of Onset  . Hypertension Mother   . Diabetes Father    Family Psychiatric  History: Unknown Social History:  Social History   Substance and Sexual Activity  Alcohol Use Not Currently   Comment: occ: last intake 4 mts ago     Social History   Substance and  Sexual Activity  Drug Use Not Currently  . Types: "Crack" cocaine, Other-see comments   Comment: Patient reports hx of smoking Crack    Social History   Socioeconomic History  . Marital status: Single    Spouse name: Not on file  . Number of children: Not on file  . Years of education: Not on file  . Highest education level: Not on file  Occupational History  . Not on file  Tobacco Use  . Smoking status: Current Every Day Smoker    Packs/day: 4.00    Years: 11.00    Pack years: 44.00    Types: Cigarettes, Cigars  . Smokeless tobacco: Never Used  . Tobacco comment: Pt declined  Vaping Use  . Vaping Use: Never used  Substance and Sexual Activity  . Alcohol use: Not Currently    Comment: occ: last intake 4 mts ago  . Drug use: Not Currently    Types: "Crack" cocaine, Other-see comments    Comment: Patient reports hx of smoking Crack  . Sexual activity: Yes    Birth control/protection: Implant  Other Topics Concern  . Not on file  Social History Narrative   ** Merged History Encounter **       Social Determinants of Health   Financial Resource Strain: Not on file  Food Insecurity: Not on file  Transportation Needs: Not on file  Physical Activity: Not on file  Stress: Not on file  Social Connections: Not on file   Additional Social History:                         Sleep: Fair  Appetite:  Fair  Current Medications: Current Facility-Administered Medications  Medication Dose Route Frequency Provider Last Rate Last Admin  . acetaminophen (TYLENOL) tablet 650 mg  650 mg Oral Q6H PRN Stacy Sours, PA-C   650 mg at 05/03/20 1934  . alum & mag hydroxide-simeth (MAALOX/MYLANTA) 200-200-20 MG/5ML suspension 30 mL  30 mL Oral Q4H PRN Stacy John W, PA-C      . diphenhydrAMINE (BENADRYL) injection 25 mg  25 mg Intramuscular Q6H PRN Stacy Marseille, Stacy E, MD      . diphenhydrAMINE (BENADRYL) injection 50 mg  50 mg Intramuscular Once Stacy Marseille, Stacy E, MD      .  haloperidol (HALDOL) tablet 5 mg  5 mg Oral Q6H PRN Stacy Asa, MD       Or  . haloperidol lactate (HALDOL) injection 5 mg  5 mg Intramuscular Q6H PRN Stacy Marseille, Stacy E, MD      . hydrOXYzine (ATARAX/VISTARIL) tablet 25 mg  25 mg Oral TID PRN Stacy Sours, PA-C   25 mg at 05/03/20 2032  . insulin detemir (LEVEMIR) injection 40 Units  40 Units Subcutaneous BID Stacy Sours, PA-C   40 Units at 05/03/20 1815  . lithium carbonate (LITHOBID) CR tablet 600 mg  600 mg Oral Q12H Damita Dunnings B, MD   600 mg at 05/03/20 2033  . LORazepam (ATIVAN) injection 2 mg  2  mg Intramuscular Once Viann Fish E, MD      . LORazepam (ATIVAN) injection 2 mg  2 mg Intramuscular Q6H PRN Stacy Asa, MD   2 mg at 05/02/20 1755  . magnesium hydroxide (MILK OF MAGNESIA) suspension 30 mL  30 mL Oral Daily PRN Stacy John W, PA-C      . nicotine polacrilex (NICORETTE) gum 2 mg  2 mg Oral PRN Damita Dunnings B, MD      . ondansetron (ZOFRAN-ODT) disintegrating tablet 4 mg  4 mg Oral Q8H PRN Stacy Marseille, Stacy E, MD      . propranolol (INDERAL) tablet 10 mg  10 mg Oral BID Stacy John W, PA-C   10 mg at 05/03/20 1817  . risperiDONE (RISPERDAL) tablet 2 mg  2 mg Oral QHS Damita Dunnings B, MD   2 mg at 05/02/20 2052  . risperiDONE (RISPERDAL) tablet 2 mg  2 mg Oral Daily Damita Dunnings B, MD      . simvastatin (ZOCOR) tablet 20 mg  20 mg Oral QHS Stacy John W, PA-C   20 mg at 05/03/20 2032  . traZODone (DESYREL) tablet 50 mg  50 mg Oral QHS PRN Stacy Sours, PA-C   50 mg at 05/03/20 2033  . white petrolatum (VASELINE) gel   Topical PRN Freida Busman, MD        Lab Results:  Results for orders placed or performed during the hospital encounter of 05/02/20 (from the past 48 hour(s))  Hemoglobin A1c     Status: None   Collection Time: 05/03/20  6:42 AM  Result Value Ref Range   Hgb A1c MFr Bld 5.6 4.8 - 5.6 %    Comment: (NOTE) Pre diabetes:          5.7%-6.4%  Diabetes:              >6.4%  Glycemic control  for   <7.0% adults with diabetes    Mean Plasma Glucose 114.02 mg/dL    Comment: Performed at [facility] [address], [place], [place] [address]  Lipid panel     Status: Abnormal   Collection Time: 05/03/20  6:42 AM  Result Value Ref Range   Cholesterol 150 0 - 200 mg/dL   Triglycerides 196 (H) <150 mg/dL   HDL 37 (L) >40 mg/dL   Total CHOL/HDL Ratio 4.1 RATIO   VLDL 39 0 - 40 mg/dL   LDL Cholesterol 74 0 - 99 mg/dL    Comment:        Total Cholesterol/HDL:CHD Risk Coronary Heart Disease Risk Table                     Men   Women  1/2 Average Risk   3.4   3.3  Average Risk       5.0   4.4  2 X Average Risk   9.6   7.1  3 X Average Risk  23.4   11.0        Use the calculated Patient Ratio above and the CHD Risk Table to determine the patient's CHD Risk.        ATP III CLASSIFICATION (LDL):  <100     mg/dL   Optimal  100-129  mg/dL   Near or Above                    Optimal  130-159  mg/dL   Borderline  160-189  mg/dL  High  >190     mg/dL   Very High Performed at [facility], [name], [place], [place] [address]   TSH     Status: None   Collection Time: 05/03/20  6:42 AM  Result Value Ref Range   TSH 1.052 0.350 - 4.500 uIU/mL    Comment: Performed by a 3rd Generation assay with a functional sensitivity of <=0.01 uIU/mL. Performed at [facility], [name] [place], [place] [address]   Lithium level     Status: Abnormal   Collection Time: 05/03/20  6:42 AM  Result Value Ref Range   Lithium Lvl 0.47 (L) 0.60 - 1.20 mmol/L    Comment: Performed at [name], [name], [place], [place] [address]  Glucose, capillary     Status: Abnormal   Collection Time: 05/03/20  4:50 PM  Result Value Ref Range   Glucose-Capillary 145 (H) 70 - 99 mg/dL    Comment: Glucose reference range applies only to [name] taken after fasting for at least 8 hours.  Glucose, capillary     Status: Abnormal    Collection Time: 05/04/20  6:30 AM  Result Value Ref Range   Glucose-Capillary 132 (H) 70 - 99 mg/dL    Comment: Glucose reference range applies only to [name] taken after fasting for at least 8 hours.    Blood Alcohol level:  Lab Results  Component Value Date   ETH <10 04/30/2020   ETH <10 04/10/2020    Metabolic Disorder Labs: Lab Results  Component Value Date   HGBA1C 5.6 05/03/2020   MPG 114.02 05/03/2020   MPG 134.11 10/10/2019   Lab Results  Component Value Date   PROLACTIN 5.3 05/19/2019   PROLACTIN 5.2 10/11/2018   Lab Results  Component Value Date   CHOL 150 05/03/2020   TRIG 196 (H) 05/03/2020   HDL 37 (L) 05/03/2020   CHOLHDL 4.1 05/03/2020   VLDL 39 05/03/2020   LDLCALC 74 05/03/2020   LDLCALC 77 07/14/2019    Physical Findings: AIMS: Facial and Oral Movements Muscles of Facial Expression: None, normal Lips and Perioral Area: None, normal Jaw: None, normal Tongue: None, normal,Extremity Movements Upper (arms, wrists, hands, fingers): None, normal Lower (legs, knees, ankles, toes): None, normal, Trunk Movements Neck, shoulders, hips: None, normal, Overall Severity Severity of abnormal movements (highest score from questions above): None, normal Incapacitation due to abnormal movements: None, normal Patient's awareness of abnormal movements (rate only patient's report): No Awareness, Dental Status Current problems with teeth and/or dentures?: No Does patient usually wear dentures?: No  CIWA:  CIWA-Ar Total: 0 COWS:  COWS Total Score: 1  Musculoskeletal: Strength & Muscle Tone: within normal limits Gait & Station: normal Patient leans: N/A  Psychiatric Specialty Exam: Physical Exam Vitals and nursing note reviewed.  Constitutional:      Appearance: Normal appearance. She is obese.  HENT:     Head: Normocephalic and atraumatic.  Cardiovascular:     Rate and Rhythm: Tachycardia present.  Pulmonary:     Effort: Pulmonary effort is normal.   Musculoskeletal:        General: Normal range of motion.  Neurological:     General: No focal deficit present.     Mental Status: She is alert.     Review of Systems  Blood pressure 109/68, pulse (!) 125, temperature (!) 97.1 F (36.2 C), temperature source Oral, resp. rate 18, height 5\' 7"  (1.702 m), weight 117.5 kg, last menstrual  period 04/10/2020.Body mass index is 40.57 kg/m.  General Appearance: Casual  Eye Contact:  Good  Speech:  Clear and Coherent and Normal Rate  Volume:  Normal  Mood:  Appropriate  Affect:  Blunt  Thought Process:  Linear  Orientation:  Full (Time, Place, and Person)  Thought Content:  Rumination on medications  Suicidal Thoughts:  Yes.  with intent/plan Overdose  Homicidal Thoughts:  No  Memory:  Immediate;   Fair Recent;   Fair  Judgement:  Impaired  Insight:  Shallow  Psychomotor Activity:  Decreased and Restlessness  Concentration:  Concentration: Fair and Attention Span: Fair  Recall:  [employer] of Knowledge:  Poor  Language:  Good  Akathisia:  Negative  Handed:  Right  AIMS (if indicated):     Assets:  Desire for Improvement Resilience  ADL's:  Intact  Cognition:  Impaired,  Mild  Sleep:  Number of Hours: 8.25     Treatment Plan Summary: Daily contact with patient to assess and evaluate symptoms and progress in treatment She continues to be very labile but with continued reassurance seems to be more stable. Will need to continue to coordinate medications with ACT Team. Will redraw Lithium level in a few days. She reports being tired during the day so will consolidate her Risperdal to the evening. Will continue to monitor. She did complain of nausea and fever/chills. She had a negative Flu A/B on admission and has had no measured fever. Will continue to monitor and can recheck rapid flu if symptoms worsen. She is currently on no roommate so will continue this.   Schizoaffective disorder, bipolar type -Consolidate Risperdal to 4 mg  QHS for Mood Stabilty -Continue Lithium 600 BID for mood stability and suicidality -Propanolol 10mg  BID, continue home -Agitation protocol   Tobacco use disorder -Nicorette gum PRN   HLD -Continue home simvastatin 20mg  QHS   T2DM - Continue levemir 40U   -Continue Zofran 4 mg PRN q8 Nausea -Continue PRN's: Tylenol, Maalox, Atarax, Milk of Magnesia, Trazodone  , MD 05/04/2020, 9:07 AM

## 2020-05-04 NOTE — ED Triage Notes (Signed)
Pt came in from [name] with no complaint. She is Covid pos and was sent over here with stable vitals. Pt had unknown amount of Ativan prior to arrival. No report given to this nurse. Pt vitals stable via EMS

## 2020-05-04 NOTE — Discharge Summary (Signed)
Physician Discharge Summary Note  Patient:  [name] is an 30 y.o., female MRN:  [id number] DOB:  August 05, 1990 Patient phone:  [phone] (home)  Patient address:   [address] [place] [address],  Total Time spent with patient: 15 minutes  Date of Admission:  05/02/2020 Date of Discharge: 05/05/2019  Reason for Admission:  Per H&P- "Mrs. Chop is a 30 yo female patient diagnosed with schizoaffective disorder, bipolar type, Learning disability, HLD, SI, Tobacco use disorder. Patient presented to [facility] after being brought by [employer]. Patient reports that she called the [employer] as she felt that she was going to attempt suicide by OD on her pills. Patient reports that she has been feeling depressed and lonely at home and reports that she really wants to be in a group home. Patient reports that she recently got a new ACTT team because she did not like her old ACTT team because she felt they were not listening to her request for a group home. Patient reports that she has also recently started haldol IM that her ACTT has been giving her for the past 2 months but patient reports TD symptoms and this has been bothering her. Patient reports that if she were to leave she would either go home and overdose or call the cops again. Patient does not endorse and HI nor AVH. Patient does report that she has been out on the streets more in an attempt to not be at home "staring at the walls" however, she has "enemies" in the street. Patient also reports that she has been going to her moms home and her mother has made it clear that she is not welcome in the home. Patient reports that she and her mother get in arguments frequently."   Principal Problem: Schizoaffective disorder, bipolar type [facility]) Discharge Diagnoses: Principal Problem:   Schizoaffective disorder, bipolar type ([place]) Active Problems:   Generalized anxiety disorder   Suicidal ideation   Borderline intellectual functioning   Learning  disability   Impulse control disorder   Diabetes mellitus ([place])   Hyperlipidemia   OCD (obsessive compulsive disorder)   HTN (hypertension)   Tobacco use disorder   Gastroesophageal reflux disease   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 2 diabetes mellitus ([place])   Vitamin D deficiency   Past Psychiatric History: Patient has been diagnosed with Bipolar 1 disorder and schizoaffective disorder. Patient has an ACTT team and reports seeing [name] on this team. Patient reports that she was in a group home in the past. Patient also reports that she has tried the following medications but had failure or negative side effects: Abilify, Seroqeul, Cymbalta, Buspar, Haldol PO, and Wellbutrin. Patient is seen frequently in the ED and at [facility]. Patient was last admitted 5/18-2021-09/13/2019 w/d Bipolar 2, depressed w/ psychotic features, 07/29/2019-07/30/2019,06/2019 and multiple times before that. Patient EMR goes back to multiple hospitalization at [facility] in 2006.  Past Medical History:  Past Medical History:  Diagnosis Date  . Anxiety   . Bipolar 1 disorder ([place])   . Cognitive deficits   . Depression   . Diabetes mellitus without complication ([place])   . Hypertension   . Mental disorder   . Mental health disorder   . Obesity     Past Surgical History:  Procedure Laterality Date  . CESAREAN SECTION    . CESAREAN SECTION N/A 04/25/2013   Procedure: REPEAT CESAREAN SECTION;  Surgeon: [name], MD;  Location: [facility];  Service: Obstetrics;  Laterality: N/A;  .  MASS EXCISION N/A 06/03/2012   Procedure: EXCISION MASS;  Surgeon: [name], MD;  Location: [facility];  Service: ENT;  Laterality: N/A;  Excision uvula mass  . TONSILLECTOMY N/A 06/03/2012   Procedure: TONSILLECTOMY;  Surgeon: [name], MD;  Location: [facility];  Service: ENT;  Laterality: N/A;  . TONSILLECTOMY     Family History:  Family History  Problem Relation Age of Onset  . Hypertension Mother   .  Diabetes Father    Family Psychiatric  History: Unknown Social History:  Social History   Substance and Sexual Activity  Alcohol Use Not Currently   Comment: occ: last intake 4 mts ago     Social History   Substance and Sexual Activity  Drug Use Not Currently  . Types: "Crack" cocaine, Other-see comments   Comment: Patient reports hx of smoking Crack    Social History   Socioeconomic History  . Marital status: Single    Spouse name: Not on file  . Number of children: Not on file  . Years of education: Not on file  . Highest education level: Not on file  Occupational History  . Not on file  Tobacco Use  . Smoking status: Current Every Day Smoker    Packs/day: 4.00    Years: 11.00    Pack years: 44.00    Types: Cigarettes, Cigars  . Smokeless tobacco: Never Used  . Tobacco comment: Pt declined  Vaping Use  . Vaping Use: Never used  Substance and Sexual Activity  . Alcohol use: Not Currently    Comment: occ: last intake 4 mts ago  . Drug use: Not Currently    Types: "Crack" cocaine, Other-see comments    Comment: Patient reports hx of smoking Crack  . Sexual activity: Yes    Birth control/protection: Implant  Other Topics Concern  . Not on file  Social History Narrative   ** Merged History Encounter **       Social Determinants of Health   Financial Resource Strain: Not on file  Food Insecurity: Not on file  Transportation Needs: Not on file  Physical Activity: Not on file  Stress: Not on file  Social Connections: Not on file    Hospital Course:  Patient presented to [facility] on 1/4 for suicide attempt via overdose. Per ED note she took ""4-5 tabs of gabapentin, 2 tabs of Lamictal (prescribed 100 mg tablets), 2 blood pressure pills (patient possibly prescribed amlodipine versus propranolol), and 4 of her daily vitamins" She was placed under IVC and and admitted to [facility] on 1/6. She was restarted on her current dose of Lithium, Lamictal, and Propanolol. She was  started on Risperdal while attempting to find out from her previous ACT Team the status of her Haldol IM. On 1/7 her current ACT Team physician [name] was reached to coordinate her medications, her Lamictal was stopped, and her Risperdal and Lithium were increased. On 1/8 she began to feel sick and had a fever. She was given a COVID test and found to be positive. She was discharged from [facility] to be sent to [facility] for further treatment in the setting of newly diagnosed COVID. The morning of her discharge from [facility] she reported continuing to have SI with plans of overdose, and denied HI and AVH.  Physical Findings: AIMS: Facial and Oral Movements Muscles of Facial Expression: None, normal Lips and Perioral Area: None, normal Jaw: None, normal Tongue: None, normal,Extremity Movements Upper (arms, wrists, hands, fingers): None,  normal Lower (legs, knees, ankles, toes): None, normal, Trunk Movements Neck, shoulders, hips: None, normal, Overall Severity Severity of abnormal movements (highest score from questions above): None, normal Incapacitation due to abnormal movements: None, normal Patient's awareness of abnormal movements (rate only patient's report): No Awareness, Dental Status Current problems with teeth and/or dentures?: No Does patient usually wear dentures?: No  CIWA:  CIWA-Ar Total: 0 COWS:  COWS Total Score: 1  Musculoskeletal: Strength & Muscle Tone: within normal limits Gait & Station: normal Patient leans: N/A  Psychiatric Specialty Exam: Physical Exam Vitals and nursing note reviewed.  Constitutional:      General: She is not in acute distress.    Appearance: Normal appearance. She is obese. She is not ill-appearing, toxic-appearing or diaphoretic.  Cardiovascular:     Rate and Rhythm: Normal rate.  Pulmonary:     Effort: Pulmonary effort is normal.  Musculoskeletal:        General: Normal range of motion.  Neurological:     General: No focal deficit present.      Mental Status: She is alert.     Review of Systems  Constitutional: Negative for fatigue and fever.  Respiratory: Negative for chest tightness and shortness of breath.   Cardiovascular: Negative for chest pain and palpitations.  Gastrointestinal: Positive for nausea and vomiting. Negative for abdominal pain, constipation and diarrhea.  Neurological: Positive for dizziness and headaches. Negative for weakness and light-headedness.  Psychiatric/Behavioral: Positive for sleep disturbance. Negative for suicidal ideas.    Blood pressure (!) 133/100, pulse 98, temperature 99 F (37.2 C), temperature source Oral, resp. rate 18, height 5\' 7"  (1.702 m), weight 117.5 kg, last menstrual period 04/10/2020.Body mass index is 40.57 kg/m.  General Appearance: Casual  Eye Contact:  Good  Speech:  Clear and Coherent and Normal Rate  Volume:  Normal  Mood:  Anxious and Depressed  Affect:  Depressed and Anxious  Thought Process:  Disorganized and Goal Directed  Orientation:  Full (Time, Place, and Person)  Thought Content:  Logical and Rumination on Group home placement  Suicidal Thoughts:  Yes.  with intent/plan Overdose  Homicidal Thoughts:  No  Memory:  Immediate;   Fair Recent;   Fair  Judgement:  Impaired  Insight:  Lacking  Psychomotor Activity:  Normal  Concentration:  Concentration: Fair and Attention Span: Fair  Recall:  [employer]  Fund of Knowledge:  Fair  Language:  Good  Akathisia:  Negative  Handed:  Right  AIMS (if indicated):     Assets:  Resilience Others:  Has ACT Team  ADL's:  Intact  Cognition:  Impaired,  Mild  Sleep:  Number of Hours: 8.25     Have you used any form of tobacco in the last 30 days? (Cigarettes, Smokeless Tobacco, Cigars, and/or Pipes): Yes  Has this patient used any form of tobacco in the last 30 days? (Cigarettes, Smokeless Tobacco, Cigars, and/or Pipes) Yes, declined smoking cessation medication.  Blood Alcohol level:  Lab Results  Component Value Date    ETH <10 04/30/2020   ETH <10 04/10/2020    Metabolic Disorder Labs:  Lab Results  Component Value Date   HGBA1C 5.6 05/03/2020   MPG 114.02 05/03/2020   MPG 134.11 10/10/2019   Lab Results  Component Value Date   PROLACTIN 32.1 (H) 05/03/2020   PROLACTIN 5.3 05/19/2019   Lab Results  Component Value Date   CHOL 150 05/03/2020   TRIG 196 (H) 05/03/2020   HDL 37 (L) 05/03/2020  CHOLHDL 4.1 05/03/2020   VLDL 39 05/03/2020   LDLCALC 74 05/03/2020   LDLCALC 77 07/14/2019    See Psychiatric Specialty Exam and Suicide Risk Assessment completed by Attending Physician prior to discharge.  Discharge destination:  Other:  [facility] for continued treatment in the setting of newly diagnosed COVID  Is patient on multiple antipsychotic therapies at discharge:  Yes,   Do you recommend tapering to monotherapy for antipsychotics?  Needs to continue to be coordinated with her ACT Team   Has Patient had three or more failed trials of antipsychotic monotherapy by history:  No  Recommended Plan for Multiple Antipsychotic Therapies: Additional reason(s) for multiple antispychotic treatment:  Currently transitioning from one ACT Team to another ACT team and she still under going medication changes.  Prescriptions given at discharge. Patient agreeable to plan. Given opportunity to ask questions. Appears to feel comfortable with discharge denies any current suicidal or homicidal thought.  Patient is also instructed prior to discharge to: Take all medications as prescribed by mental healthcare provider. Report any adverse effects and or reactions from the medicines to outpatient provider promptly. Patient has been instructed & cautioned: To not engage in alcohol and or illegal drug use while on prescription medicines. In the event of worsening symptoms,  patient is instructed to call the crisis hotline, 911 and or go to the nearest ED for appropriate evaluation and treatment of symptoms. To follow-up  with primary care provider for other medical issues, concerns and or health care needs  The patient was evaluated each day by a clinical provider to ascertain response to treatment. Improvement was noted by the patient's report of decreasing symptoms, improved sleep and appetite, affect, medication tolerance, behavior, and participation in unit programming.  Patient was asked each day to complete a self inventory noting mood, mental status, pain, new symptoms, anxiety and concerns.  Patient responded well to medication and being in a therapeutic and supportive environment. Positive and appropriate behavior was noted and the patient was motivated for recovery. The patient worked closely with the treatment team and case manager to develop a discharge plan with appropriate goals. Coping skills, problem solving as well as relaxation therapies were also part of the unit programming.  By the day of discharge patient was in much improved condition than upon admission.  Symptoms were reported as significantly decreased or resolved completely. The patient denied SI/HI and voiced no AVH. The patient was motivated to continue taking medication with a goal of continued improvement in mental health.   Patient was discharged home with a plan to follow up as noted below.  Discharge Instructions    Diet - low sodium heart healthy   Complete by: As directed    Increase activity slowly   Complete by: As directed      Allergies as of 05/04/2020      Reactions   Wellbutrin [bupropion] Shortness Of Breath   Omnipaque [iohexol] Swelling, Other (See Comments)   Reaction:  Eye swelling   Penicillins Hives, Other (See Comments)   Has patient had a PCN reaction causing immediate rash, facial/tongue/throat swelling, SOB or lightheadedness with hypotension: Unknown Has patient had a PCN reaction causing severe rash involving mucus membranes or skin necrosis: Yes Has patient had a PCN reaction that required  hospitalization Unknown Has patient had a PCN reaction occurring within the last 10 years: No If all of the above answers are "NO", then may proceed with Cephalosporin use.   Cogentin [benztropine]    Make  pt feel crazy   Depakote Er [divalproex Sodium Er] Nausea And Vomiting      Medication List    STOP taking these medications   ARIPiprazole ER 400 MG Srer injection Commonly known as: ABILIFY MAINTENA   gabapentin 300 MG capsule Commonly known as: NEURONTIN   lamoTRIgine 100 MG tablet Commonly known as: LAMICTAL   Levemir FlexTouch 100 UNIT/ML FlexPen Generic drug: insulin detemir Replaced by: insulin detemir 100 UNIT/ML injection   perphenazine 2 MG tablet Commonly known as: TRILAFON     TAKE these medications     Indication  Banophen 25 mg capsule Generic drug: diphenhydrAMINE Take 25 mg by mouth at bedtime.    benztropine 1 MG tablet Commonly known as: COGENTIN Take 1 mg by mouth 2 (two) times daily.    cholecalciferol 25 MCG (1000 UNIT) tablet Commonly known as: VITAMIN D Take 1,000 Units by mouth daily.    glipiZIDE 10 MG 24 hr tablet Commonly known as: GLUCOTROL XL Take 10 mg by mouth once.    haloperidol decanoate 50 MG/ML injection Commonly known as: HALDOL DECANOATE Inject 50 mg into the muscle every 28 (twenty-eight) days.    insulin detemir 100 UNIT/ML injection Commonly known as: LEVEMIR Inject 0.4 mLs (40 Units total) into the skin 2 (two) times daily. Start taking on: May 05, 2020 Replaces: Levemir FlexTouch 100 UNIT/ML FlexPen  Indication: Type 2 Diabetes   lithium carbonate 300 MG CR tablet Commonly known as: LITHOBID Take 2 tablets (600 mg total) by mouth every 12 (twelve) hours. What changed:   medication strength  how much to take  when to take this  Indication: Schizoaffective Disorder   omeprazole 20 MG capsule Commonly known as: PRILOSEC Take 20 mg by mouth daily.    ondansetron 4 MG disintegrating tablet Commonly  known as: Zofran ODT Take 1 tablet (4 mg total) by mouth every 8 (eight) hours as needed for nausea or vomiting.    propranolol 10 MG tablet Commonly known as: INDERAL Take 1 tablet (10 mg total) by mouth 2 (two) times daily. Start taking on: May 05, 2020  Indication: High Blood Pressure Disorder, Lithium-Induced Tremor   risperidone 4 MG tablet Commonly known as: RISPERDAL Take 1 tablet (4 mg total) by mouth at bedtime.  Indication: Schizophrenia   simvastatin 20 MG tablet Commonly known as: ZOCOR Take 20 mg by mouth at bedtime.  Indication: High Amount of Fats in the Blood   vitamin B-12 1000 MCG tablet Commonly known as: CYANOCOBALAMIN Take 1,000 mcg by mouth daily.         Follow-up recommendations:   - Activity as tolerated. - Diet as recommended by PCP. - Keep all scheduled follow-up appointments as recommended.  Comments:  Patient is instructed to take all prescribed medications as recommended. Report any side effects or adverse reactions to your outpatient psychiatrist. Patient is instructed to abstain from alcohol and illegal drugs while on prescription medications. In the event of worsening symptoms, patient is instructed to call the crisis hotline, 911, or go to the nearest emergency department for evaluation and treatment.  Signed: Briant Cedar, MD 05/04/2020, 7:23 PM

## 2020-05-04 NOTE — Progress Notes (Addendum)
   05/04/20 1456  Vital Signs  Temp (!) 100.7 F (38.2 C)  Temp Source Oral  Pulse Rate (!) 110  Pulse Rate Source Dinamap  BP 131/76  BP Location Right Arm  BP Method Automatic  Patient Position (if appropriate) Lying   Late note:  D: Pt.'s temperature went up to 100.7, pulse increased from 99 to 110 and BP increased to 131/76.  Doctor was informed. Pt. Was told to mask up and to stay in her room. Doctor ordered flu and PCR test. Swabs were taken and sent in STAT. The 500 unit was put on unit restrictions. Pt. Came out of her room often without a mask and was redirected to put on her mask and return to her room.   Lab called @1811 - Pt. Is COVID positive Drs notified. Pt. Notified. Pt.'s mother notified.

## 2020-05-04 NOTE — Progress Notes (Signed)
   05/04/20 1100  Vital Signs  Temp 99.4 F (37.4 C)  Temp Source Oral  Pulse Rate 99  BP 95/64  BP Location Left Arm  BP Method Automatic  Patient Position (if appropriate) Sitting   D: Patient denies SI/HI. Patient admits to having some bad dreams. Pt. Denies anxiety and depression. In the late morning pt. Reported "felling bad" dizzy and nausea. Doctor notified. Pt. Heard coughing in her room. Pt. Isolated in her room for most of the morning shift. A:  Patient took scheduled medicine except pt refused glipizide because it makes her "fall out". Pt. Was given  4 mg of Zofran ODT for nausea.  Support and encouragement provided Routine safety checks conducted every 15 minutes. Patient  Informed to notify staff with any concerns.   R:  Safety maintained.

## 2020-05-04 NOTE — [facility] Suicide Risk Assessment (Signed)
[facility] Admission Suicide Risk Assessment   Nursing information obtained from:  Patient Demographic factors:  Living alone,Low socioeconomic status Current Mental Status:  Self-harm thoughts,Suicidal ideation indicated by patient,Suicide plan Loss Factors:  Financial problems / change in socioeconomic status Historical Factors:  Prior suicide attempts Risk Reduction Factors:  NA  Total Time spent with patient: 30 minutes Principal Problem: Schizoaffective disorder, bipolar type ([place]) Diagnosis:  Principal Problem:   Schizoaffective disorder, bipolar type ([place]) Active Problems:   Generalized anxiety disorder   Suicidal ideation   Borderline intellectual functioning   Learning disability   Impulse control disorder   Diabetes mellitus ([place])   Hyperlipidemia   OCD (obsessive compulsive disorder)   HTN (hypertension)   Tobacco use disorder   Gastroesophageal reflux disease   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 2 diabetes mellitus ([place])   Vitamin D deficiency  Subjective Data: [name] is a 30 y.o. female with schizoaffective disorder. She has been medication compliant with improvement of symptoms.  Patient developed flu symptoms with upper respiratory and GI complaints.  Patient tested COVID positive. She consented to voluntary treatment and IVC was discontinued.  Patient signed consent for treatment. She is unvaccinated for COVID, and was transferred to the emergency department for further evaluation and treatment.    Continued Clinical Symptoms:  Alcohol Use Disorder Identification Test Final Score (AUDIT): 3 The "Alcohol Use Disorders Identification Test", Guidelines for Use in Primary Care, Second Edition.  World [facility]). Score between 0-7:  no or low risk or alcohol related problems. Score between 8-15:  moderate risk of alcohol related problems. Score between 16-19:  high risk of alcohol related problems. Score 20 or above:  warrants further diagnostic evaluation  for alcohol dependence and treatment.   CLINICAL FACTORS:   Schizo-affective   Musculoskeletal: Strength & Muscle Tone: within normal limits Gait & Station: normal Patient leans: N/A  Psychiatric Specialty Exam: Physical Exam Vitals and nursing note reviewed.  Constitutional:      Appearance: Normal appearance. She is obese.  HENT:     Head: Normocephalic and atraumatic.  Cardiovascular:     Rate and Rhythm: Tachycardia present.  Pulmonary:     Effort: Pulmonary effort is normal.  Musculoskeletal:        General: Normal range of motion.  Neurological:     General: No focal deficit present.     Mental Status: She is alert.     Review of Systems  Blood pressure 109/68, pulse (!) 125, temperature (!) 97.1 F (36.2 C), temperature source Oral, resp. rate 18, height 5\' 7"  (1.702 m), weight 117.5 kg, last menstrual period 04/10/2020.Body mass index is 40.57 kg/m.  General Appearance: Casual  Eye Contact:  Good  Speech:  Clear and Coherent and Normal Rate  Volume:  Normal  Mood:  Appropriate  Affect:  Blunt  Thought Process:  Linear  Orientation:  Full (Time, Place, and Person)  Thought Content:  Rumination on medications  Suicidal Thoughts:  Yes.  with intent/plan Overdose  Homicidal Thoughts:  No  Memory:  Immediate;   Fair Recent;   Fair  Judgement:  Impaired  Insight:  Shallow  Psychomotor Activity:  Decreased and Restlessness  Concentration:  Concentration: Fair and Attention Span: Fair  Recall:  [employer] of Knowledge:  Poor  Language:  Good  Akathisia:  Negative  Handed:  Right  AIMS (if indicated):     Assets:  Desire for Improvement Resilience  ADL's:  Intact  Cognition:  Impaired,  Mild  Sleep:  Number of Hours: 8.25    COGNITIVE FEATURES THAT CONTRIBUTE TO RISK:  None    SUICIDE RISK:   Minimal: No identifiable suicidal ideation.  Patients presenting with no risk factors but with morbid ruminations; may be classified as minimal risk based on  the severity of the depressive symptoms  PLAN OF CARE: Discharged to [name], ED for treatment of COVID.   I certify that inpatient services furnished can reasonably be expected to improve the patient's condition.   Lavella Hammock, MD 05/04/2020, 7:02 PM

## 2020-05-04 NOTE — ED Provider Notes (Signed)
[facility]-EM[facility]GENCY DEPT Provider Note   CSN: [id number] Arrival date & time: 05/04/20  1908     History Chief Complaint  Patient presents with  . Medical Clearance    [name] is a 30 y.o. female.  HPI Patient with multiple medical issues, and psychiatric disease presents from our behavioral health facility after she was cleared psychiatrically, but was diagnosed positive for coronavirus earlier today. Patient describes fatigue, generalized weakness without focality.  No dyspnea, though she does have cough. No fever. There is nausea, but no vomiting. No medication taken for relief. Additional history obtained on chart review, including details from psychiatric clearance just prior to transfer including rescinding the IVC that was in place for this patient. Patient states that she does not currently have suicidal thoughts, though she acknowledges that this is a frequent occurrence for her.     Past Medical History:  Diagnosis Date  . Anxiety   . Bipolar 1 disorder (HCC)   . Cognitive deficits   . Depression   . Diabetes mellitus without complication (HCC)   . Hypertension   . Mental disorder   . Mental health disorder   . Obesity     Patient Active Problem List   Diagnosis Date Noted  . Gastroesophageal reflux disease 05/04/2020  . Hyperglycemia due to type 2 diabetes mellitus (HCC) 05/04/2020  . Long term (current) use of insulin (HCC) 05/04/2020  . Migraine without aura 05/04/2020  . Morbid obesity (HCC) 05/04/2020  . 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 2 diabetes mellitus (HCC) 05/04/2020  . Prolapsed internal hemorrhoids 05/04/2020  . Vitamin D deficiency 05/04/2020  . Other symptoms and signs involving cognitive functions and awareness 05/04/2020  . Suicide attempt (HCC)   . Anxiety state 03/06/2020  . Schizophrenia (HCC) 09/13/2019  . Bipolar 1 disorder (HCC) 06/23/2019  . MDD (major depressive disorder) 10/10/2018  . Schizoaffective  disorder, bipolar type (HCC) 09/25/2018  . Bipolar I disorder (HCC) 06/13/2018  . HTN (hypertension) 05/03/2018  . Tobacco use disorder 05/03/2018  . Adjustment disorder with emotional disturbance 01/02/2018  . 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 Moderate bipolar I disorder, most recent episode depressed (HCC)   . Psychosis (HCC)   . 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 OCD (obsessive compulsive disorder) 10/05/2016  . Major depressive disorder, recurrent episode, mild (HCC) 05/04/2016  . 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 Learning disability 07/18/2015  . 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 Diabetes mellitus (HCC) 07/18/2015  . 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 Hyperlipidemia 07/18/2015  . Severe episode of recurrent major depressive disorder, without psychotic features (HCC)   . Suicidal ideation   . Drug overdose   . Cognitive deficits 10/12/2012  . Generalized anxiety disorder 06/28/2012    Past Surgical History:  Procedure Laterality Date  . CESAREAN SECTION    . CESAREAN SECTION N/A 04/25/2013   Procedure: REPEAT CESAREAN SECTION;  Surgeon: Catalina Antigua, MD;  Location: [facility];  Service: Obstetrics;  Laterality: N/A;  . MASS EXCISION N/A 06/03/2012   Procedure: EXCISION MASS;  Surgeon: Osborn Coho, MD;  Location: [facility];  Service: ENT;  Laterality: N/A;  Excision uvula mass  . TONSILLECTOMY N/A 06/03/2012   Procedure: TONSILLECTOMY;  Surgeon: Osborn Coho, MD;  Location: [facility];  Service: ENT;  Laterality: N/A;  . TONSILLECTOMY       OB History  Gravida  3   Para  3   Term  3   Preterm  0   AB  0   Living  3     SAB  0   IAB  0   Ectopic  0   Multiple      Live Births  3           Family History  Problem Relation Age of Onset  . Hypertension  Mother   . Diabetes Father     Social History   Tobacco Use  . Smoking status: Current Every Day Smoker    Packs/day: 4.00    Years: 11.00    Pack years: 44.00    Types: Cigarettes, Cigars  . Smokeless tobacco: Never Used  . Tobacco comment: Pt declined  Vaping Use  . Vaping Use: Never used  Substance Use Topics  . Alcohol use: Not Currently    Comment: occ: last intake 4 mts ago  . Drug use: Not Currently    Types: "Crack" cocaine, Other-see comments    Comment: Patient reports hx of smoking Crack    Home Medications Prior to Admission medications   Medication Sig Start Date End Date Taking? Authorizing Provider  ondansetron (ZOFRAN ODT) 4 MG disintegrating tablet Take 1 tablet (4 mg total) by mouth every 8 (eight) hours as needed for nausea or vomiting. 05/04/20  Yes Gerhard Munch, MD  BANOPHEN 25 MG capsule Take 25 mg by mouth at bedtime. 04/30/20   [provider]  benztropine (COGENTIN) 1 MG tablet Take 1 mg by mouth 2 (two) times daily. 05/01/20   [provider]  cholecalciferol (VITAMIN D) 25 MCG (1000 UNIT) tablet Take 1,000 Units by mouth daily. 04/30/20   [provider]  glipiZIDE (GLUCOTROL XL) 10 MG 24 hr tablet Take 10 mg by mouth once.    [provider]  haloperidol decanoate (HALDOL DECANOATE) 50 MG/ML injection Inject 50 mg into the muscle every 28 (twenty-eight) days. 03/19/20   [provider]  insulin detemir (LEVEMIR) 100 UNIT/ML injection Inject 0.4 mLs (40 Units total) into the skin 2 (two) times daily. 05/05/20   Lauro Franklin, MD  lithium carbonate (LITHOBID) 300 MG CR tablet Take 2 tablets (600 mg total) by mouth every 12 (twelve) hours. 05/04/20 06/03/20  Lauro Franklin, MD  omeprazole (PRILOSEC) 20 MG capsule Take 20 mg by mouth daily. 04/30/20   [provider]  ondansetron (ZOFRAN ODT) 4 MG disintegrating tablet Take 1 tablet (4 mg total) by mouth every 8 (eight) hours as needed for nausea or  vomiting. 04/24/20   Petrucelli, Samantha R, PA-C  propranolol (INDERAL) 10 MG tablet Take 1 tablet (10 mg total) by mouth 2 (two) times daily. 05/05/20 06/04/20  Lauro Franklin, MD  risperiDONE (RISPERDAL) 4 MG tablet Take 1 tablet (4 mg total) by mouth at bedtime. 05/04/20 06/03/20  Lauro Franklin, MD  simvastatin (ZOCOR) 20 MG tablet Take 20 mg by mouth at bedtime. 08/21/19   [provider]  vitamin B-12 (CYANOCOBALAMIN) 1000 MCG tablet Take 1,000 mcg by mouth daily. 11/13/19   [provider]    Allergies    Wellbutrin [bupropion], Omnipaque [iohexol], Penicillins, Cogentin [benztropine], and Depakote er [divalproex sodium er]  Review of Systems   Review of Systems  Constitutional: Positive for fatigue. Negative for fever.  HENT:       Per HPI, otherwise negative  Respiratory:       Per HPI, otherwise negative  Cardiovascular:       Per HPI, otherwise negative  Gastrointestinal: Positive for nausea. Negative for vomiting.  Endocrine:       Negative aside from HPI  Genitourinary:       Neg aside from HPI   Musculoskeletal:       Per HPI, otherwise negative  Skin: Negative.   Neurological: Negative for syncope.  Psychiatric/Behavioral: Positive for dysphoric mood. Negative for suicidal ideas.    Physical Exam Updated Vital Signs BP (!) 133/100 (BP Location: Right Arm) Comment: Simultaneous filing. User may not have seen previous data.  Pulse 98 Comment: Simultaneous filing. User may not have seen previous data.  Temp 99 F (37.2 C) (Oral)   Resp 18   Ht 5\' 7"  (1.702 m)   Wt 117.5 kg   LMP 05/04/2020   SpO2 96%   BMI 40.57 kg/m   Physical Exam Vitals and nursing note reviewed.  Constitutional:      General: She is not in acute distress.    Appearance: She is well-developed and well-nourished.     Comments: Withdrawn adult F awake and alert, in NAD, breathing w/o apparent distress.  HENT:     Head: Normocephalic and atraumatic.  Eyes:      Extraocular Movements: EOM normal.     Conjunctiva/sclera: Conjunctivae normal.  Cardiovascular:     Rate and Rhythm: Normal rate and regular rhythm.  Pulmonary:     Effort: Pulmonary effort is normal. No respiratory distress.     Breath sounds: Normal breath sounds. No stridor.  Abdominal:     General: There is no distension.  Musculoskeletal:        General: No edema.  Skin:    General: Skin is warm and dry.  Neurological:     Mental Status: She is alert and oriented to person, place, and time.     Cranial Nerves: No cranial nerve deficit.  Psychiatric:        Mood and Affect: Mood and affect normal.     Comments: Patient acknowledges recent suicidal ideation, denies any currently.     ED Results / Procedures / Treatments   Labs (all labs ordered are listed, but only abnormal results are displayed) Labs Reviewed  RESP PANEL BY RT-PCR (RSV, FLU A&B, COVID)  RVPGX2 - Abnormal; Notable for the following components:      Result Value   SARS Coronavirus 2 by RT PCR POSITIVE (*)    All other components within normal limits  GLUCOSE, CAPILLARY - Abnormal; Notable for the following components:   Glucose-Capillary 160 (*)    All other components within normal limits  LIPID PANEL - Abnormal; Notable for the following components:   Triglycerides 196 (*)    HDL 37 (*)    All other components within normal limits  PROLACTIN - Abnormal; Notable for the following components:   Prolactin 32.1 (*)    All other components within normal limits  LITHIUM LEVEL - Abnormal; Notable for the following components:   Lithium Lvl 0.47 (*)    All other components within normal limits  GLUCOSE, CAPILLARY - Abnormal; Notable for the following components:   Glucose-Capillary 145 (*)    All other components within normal limits  GLUCOSE, CAPILLARY - Abnormal; Notable for the following components:   Glucose-Capillary 132 (*)    All other components within normal limits  GLUCOSE, CAPILLARY -  Abnormal; Notable for the following components:   Glucose-Capillary 146 (*)    All other components within normal  limits  CBC WITH DIFFERENTIAL/PLATELET - Abnormal; Notable for the following components:   MCH 25.7 (*)    RDW 15.6 (*)    All other components within normal limits  COMPREHENSIVE METABOLIC PANEL - Abnormal; Notable for the following components:   Glucose, Bld 109 (*)    Alkaline Phosphatase 37 (*)    All other components within normal limits  HEMOGLOBIN A1C  TSH  CBG MONITORING, ED  CBG MONITORING, ED  CBG MONITORING, ED    EKG None  Radiology DG Chest 2 View  Result Date: 05/04/2020 CLINICAL DATA:  30 year old female with positive COVID-19. EXAM: CHEST - 2 VIEW COMPARISON:  Chest radiograph dated 04/03/2020. FINDINGS: The heart size and mediastinal contours are within normal limits. Both lungs are clear. The visualized skeletal structures are unremarkable. IMPRESSION: No active cardiopulmonary disease. Electronically Signed   By: Elgie Collard M.D.   On: 05/04/2020 19:58    Procedures Procedures (including critical care time)  Medications Ordered in ED Medications  insulin detemir (LEVEMIR) injection 40 Units (40 Units Subcutaneous Given 05/04/20 1712)  propranolol (INDERAL) tablet 10 mg (10 mg Oral Given 05/04/20 1716)  simvastatin (ZOCOR) tablet 20 mg (20 mg Oral Given 05/03/20 2032)  acetaminophen (TYLENOL) tablet 650 mg (650 mg Oral Given 05/04/20 1310)  alum & mag hydroxide-simeth (MAALOX/MYLANTA) 200-200-20 MG/5ML suspension 30 mL (has no administration in time range)  magnesium hydroxide (MILK OF MAGNESIA) suspension 30 mL (has no administration in time range)  hydrOXYzine (ATARAX/VISTARIL) tablet 25 mg (25 mg Oral Given 05/03/20 2032)  traZODone (DESYREL) tablet 50 mg (50 mg Oral Given 05/03/20 2033)  white petrolatum (VASELINE) gel (has no administration in time range)  nicotine polacrilex (NICORETTE) gum 2 mg (has no administration in time range)  LORazepam  (ATIVAN) injection 2 mg (2 mg Intramuscular Not Given 05/02/20 1847)  diphenhydrAMINE (BENADRYL) injection 50 mg (has no administration in time range)  LORazepam (ATIVAN) 2 MG/ML injection (  Not Given 05/02/20 1848)  haloperidol (HALDOL) tablet 5 mg ( Oral See Alternative 05/04/20 1857)    Or  haloperidol lactate (HALDOL) injection 5 mg (5 mg Intramuscular Given 05/04/20 1857)  diphenhydrAMINE (BENADRYL) injection 25 mg (25 mg Intramuscular Given 05/04/20 1857)  LORazepam (ATIVAN) injection 2 mg (2 mg Intramuscular Given 05/04/20 1858)  ondansetron (ZOFRAN-ODT) disintegrating tablet 4 mg (4 mg Oral Given 05/04/20 1857)  lithium carbonate (LITHOBID) CR tablet 600 mg (600 mg Oral Given 05/04/20 0939)  risperiDONE (RISPERDAL) tablet 4 mg (has no administration in time range)  LORazepam (ATIVAN) tablet 2 mg (has no administration in time range)    Or  LORazepam (ATIVAN) injection 2 mg (has no administration in time range)  ondansetron (ZOFRAN) tablet 4 mg (has no administration in time range)  sodium chloride 0.9 % bolus 1,000 mL (has no administration in time range)  ketorolac (TORADOL) 30 MG/ML injection 15 mg (has no administration in time range)  haloperidol lactate (HALDOL) injection 10 mg (10 mg Intramuscular Given 05/02/20 1756)  diphenhydrAMINE (BENADRYL) 50 MG/ML injection (50 mg  Given 05/02/20 1752)    ED Course  I have reviewed the triage vital signs and the nursing notes.  Pertinent labs & imaging results that were available during my care of the patient were reviewed by me and considered in my medical decision making (see chart for details).    MDM Rules/Calculators/A&P  10:56 PM Patient awake, alert, in no distress sitting upright. No new oxygen requirement, no evidence for discomfort, no hemodynamic instability.  Given the patient's reassuring vital signs, physical exam she is appropriate for outpatient monoclonal antibody therapy, to which she is amenable. Patient  denies any current suicidal ideation, though suicidal rumination is noted in her chart. With reassuring exam, labs, x-ray, patient discharged in stable condition.  Final Clinical Impression(s) / ED Diagnoses Final diagnoses:  COVID    Rx / DC Orders ED Discharge Orders         Ordered    propranolol (INDERAL) 10 MG tablet  2 times daily        05/04/20 1923    insulin detemir (LEVEMIR) 100 UNIT/ML injection  2 times daily        05/04/20 1923    lithium carbonate (LITHOBID) 300 MG CR tablet  Every 12 hours        05/04/20 1923    risperiDONE (RISPERDAL) 4 MG tablet  Daily at bedtime        05/04/20 1923    Increase activity slowly        05/04/20 1923    Diet - low sodium heart healthy        05/04/20 1923    ondansetron (ZOFRAN ODT) 4 MG disintegrating tablet  Every 8 hours PRN        05/04/20 2256           Gerhard Munch, MD 05/05/20 1436

## 2020-05-04 NOTE — Discharge Instructions (Signed)
As discussed, you have been diagnosed with coronavirus.  He will be contacted in the coming days for possible enrollment in appropriate medications that you can receive as an outpatient.  In addition of this, follow-up with your primary care physician and your psychiatrist to ensure appropriate ongoing management of your psychiatric disease.  Return here for concerning changes in your condition.

## 2020-05-04 NOTE — Progress Notes (Signed)
   05/04/20 0000  Psych Admission Type (Psych Patients Only)  Admission Status Involuntary  Psychosocial Assessment  Patient Complaints Anxiety  Eye Contact Fair  Facial Expression Anxious  Affect Anxious;Labile  Speech Logical/coherent  Interaction Assertive  Motor Activity Slow  Appearance/Hygiene Disheveled;Body odor  Behavior Characteristics Cooperative  Mood Labile  Thought Process  Coherency Disorganized  Content WDL  Delusions None reported or observed  Perception WDL  Hallucination None reported or observed  Judgment Poor  Confusion None  Danger to Self  Current suicidal ideation? Denies  Self-Injurious Behavior No self-injurious ideation or behavior indicators observed or expressed   Agreement Not to Harm Self Yes  Description of Agreement verbal contract for safety  Danger to Others  Danger to Others None reported or observed

## 2020-05-04 NOTE — Progress Notes (Signed)
Pt transferred to [facility] after testing positive for COVID-19 with symptoms including fever, headaches and emesis. Observed to be very anxious, agitated about result. Received PRN Ativan, Haldol and Benadryl for agitation prior to transfer. Writer called and give report to [facility] charge nurse, Manuela Schwartz, RN and pt transferred via ambulance. Q 15 minutes safety checks maintained till d/c. Emotional support and reassurance offered to pt throughout this shift.

## 2020-05-05 MED ORDER — ONDANSETRON 4 MG PO TBDP: 4.0000 mg | ORAL_TABLET | Freq: Three times a day (TID) | ORAL | 0 refills | Status: DC | PRN

## 2020-05-05 MED ORDER — LITHIUM CARBONATE ER 300 MG PO TBCR: 600.0000 mg | EXTENDED_RELEASE_TABLET | Freq: Two times a day (BID) | ORAL | 0 refills | Status: DC

## 2020-05-05 MED ORDER — PROPRANOLOL HCL 10 MG PO TABS: 10.0000 mg | ORAL_TABLET | Freq: Two times a day (BID) | ORAL | 0 refills | Status: DC

## 2020-05-05 MED ORDER — INSULIN DETEMIR 100 UNIT/ML ~~LOC~~ SOLN: 40.0000 [IU] | Freq: Two times a day (BID) | SUBCUTANEOUS | 11 refills | Status: DC

## 2020-05-05 MED ORDER — RISPERIDONE 4 MG PO TABS: 4.0000 mg | ORAL_TABLET | Freq: Every day | ORAL | 0 refills | Status: DC

## 2020-05-06 ENCOUNTER — Encounter (HOSPITAL_COMMUNITY): Payer: Self-pay

## 2020-05-06 ENCOUNTER — Emergency Department (HOSPITAL_COMMUNITY)
Admission: EM | Admit: 2020-05-06 | Discharge: 2020-05-06 | Disposition: A | Discharge status: Home / Self Care | Payer: Medicaid Other | Attending: Emergency Medicine | Admitting: Emergency Medicine

## 2020-05-06 DIAGNOSIS — I10 Essential (primary) hypertension: Secondary | ICD-10-CM | POA: Diagnosis not present

## 2020-05-06 DIAGNOSIS — R45851 Suicidal ideations: Secondary | ICD-10-CM | POA: Diagnosis not present

## 2020-05-06 DIAGNOSIS — Z7984 Long term (current) use of oral hypoglycemic drugs: Secondary | ICD-10-CM | POA: Insufficient documentation

## 2020-05-06 DIAGNOSIS — E119 Type 2 diabetes mellitus without complications: Secondary | ICD-10-CM | POA: Diagnosis not present

## 2020-05-06 DIAGNOSIS — Z794 Long term (current) use of insulin: Secondary | ICD-10-CM | POA: Diagnosis not present

## 2020-05-06 DIAGNOSIS — F17210 Nicotine dependence, cigarettes, uncomplicated: Secondary | ICD-10-CM | POA: Diagnosis not present

## 2020-05-06 DIAGNOSIS — F17290 Nicotine dependence, other tobacco product, uncomplicated: Secondary | ICD-10-CM | POA: Insufficient documentation

## 2020-05-06 DIAGNOSIS — Z79899 Other long term (current) drug therapy: Secondary | ICD-10-CM | POA: Insufficient documentation

## 2020-05-06 DIAGNOSIS — U071 COVID-19: Secondary | ICD-10-CM | POA: Diagnosis not present

## 2020-05-06 MED ORDER — PANTOPRAZOLE SODIUM 40 MG PO TBEC
40.0000 mg | DELAYED_RELEASE_TABLET | Freq: Every day | ORAL | Status: DC
  Filled 2020-05-06: qty 1

## 2020-05-06 MED ORDER — INSULIN DETEMIR 100 UNIT/ML ~~LOC~~ SOLN
40.0000 [IU] | Freq: Once | SUBCUTANEOUS | Status: DC
Start: 2020-05-06 — End: 2020-05-07
  Filled 2020-05-06: qty 0.4

## 2020-05-06 MED ORDER — GLIPIZIDE ER 10 MG PO TB24
10.0000 mg | ORAL_TABLET | Freq: Once | ORAL | Status: DC
  Filled 2020-05-06: qty 1

## 2020-05-06 MED ORDER — PROPRANOLOL HCL 20 MG PO TABS
10.0000 mg | ORAL_TABLET | Freq: Once | ORAL | Status: DC
  Filled 2020-05-06: qty 1

## 2020-05-06 MED ORDER — LITHIUM CARBONATE ER 300 MG PO TBCR
600.0000 mg | EXTENDED_RELEASE_TABLET | Freq: Once | ORAL | Status: DC
Start: 2020-05-06 — End: 2020-05-07
  Filled 2020-05-06: qty 2

## 2020-05-06 MED ORDER — ONDANSETRON 4 MG PO TBDP
4.0000 mg | ORAL_TABLET | Freq: Once | ORAL | Status: DC
  Filled 2020-05-06: qty 1

## 2020-05-06 MED ORDER — VITAMIN B-12 1000 MCG PO TABS
1000.0000 ug | ORAL_TABLET | Freq: Once | ORAL | Status: DC
  Filled 2020-05-06: qty 1

## 2020-05-06 MED ORDER — BENZTROPINE MESYLATE 1 MG PO TABS
1.0000 mg | ORAL_TABLET | Freq: Once | ORAL | Status: DC
  Filled 2020-05-06: qty 1

## 2020-05-06 MED ORDER — VITAMIN D3 25 MCG (1000 UNIT) PO TABS
1000.0000 [IU] | ORAL_TABLET | Freq: Every day | ORAL | Status: DC
  Filled 2020-05-06 (×2): qty 1

## 2020-05-06 NOTE — ED Provider Notes (Signed)
[facility] DEPT Provider Note   CSN: [id number] Arrival date & time: 05/06/20  1735     History Chief Complaint  Patient presents with  . Covid Positive  . Suicidal    [name] is a 30 y.o. female.  Patient is a 29 year old female with history of mental illness who recently tested positive for Covid 2 days ago, hypertension and diabetes who is presenting today complaining that her medications for her mental illness did not arrive today and it makes her feel bad and makes her feel suicidal.  She also reports she does not feel good because she has Covid.  She states that she wants to be in a group home and she was recently discharged from behavioral health.  Based on recent medical records patient was admitted at behavioral health due to suicidal ideation.  She did have a changes in her medications and because she started having flulike illness on the eighth she was tested for Covid and tested positive and promptly discharged to [facility].  She was psychiatrically cleared at that time and she was not having any significant distress from Covid and was discharged home.  She has recently started seeing a new act team who was supposed to coordinate medication arrival to her home today and she said they never arrived.  This made her feel distressed and suicidal.  She does not currently have a plan and denies feeling suicidal at this moment.  The history is provided by the patient and medical records.       Past Medical History:  Diagnosis Date  . Anxiety   . Bipolar 1 disorder ([place])   . Cognitive deficits   . Depression   . Diabetes mellitus without complication ([place])   . Hypertension   . Mental disorder   . Mental health disorder   . Obesity     Patient Active Problem List   Diagnosis Date Noted  . Gastroesophageal reflux disease 05/04/2020  . Hyperglycemia due to type 2 diabetes mellitus ([place]) 05/04/2020  . Long term (current) use  of insulin ([place]) 05/04/2020  . Migraine without aura 05/04/2020  . Morbid obesity ([place]) 05/04/2020  . 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 2 diabetes mellitus ([place]) 05/04/2020  . Prolapsed internal hemorrhoids 05/04/2020  . Vitamin D deficiency 05/04/2020  . Other symptoms and signs involving cognitive functions and awareness 05/04/2020  . Suicide attempt ([place])   . Anxiety state 03/06/2020  . Schizophrenia ([place]) 09/13/2019  . Bipolar 1 disorder ([place]) 06/23/2019  . MDD (major depressive disorder) 10/10/2018  . Schizoaffective disorder, bipolar type ([place]) 09/25/2018  . Bipolar I disorder ([place]) 06/13/2018  . HTN (hypertension) 05/03/2018  . Tobacco use disorder 05/03/2018  . Adjustment disorder with emotional disturbance 01/02/2018  . 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 Moderate bipolar I disorder, most recent episode depressed ([place])   . Psychosis ([place])   . 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 OCD (obsessive compulsive disorder) 10/05/2016  . Major depressive disorder, recurrent episode, mild ([place]) 05/04/2016  . 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 Learning disability 07/18/2015  . 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 Diabetes mellitus ([place]) 07/18/2015  . 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 Hyperlipidemia 07/18/2015  . Severe episode of recurrent major depressive disorder, without psychotic features ([place])   . Suicidal ideation   . Drug overdose   . Cognitive deficits 10/12/2012  .  Generalized anxiety disorder 06/28/2012    Past Surgical History:  Procedure Laterality Date  . CESAREAN SECTION    . CESAREAN SECTION N/A 04/25/2013   Procedure: REPEAT CESAREAN SECTION;  Surgeon: [name], MD;  Location: [facility];  Service: Obstetrics;  Laterality: N/A;  . MASS EXCISION N/A 06/03/2012   Procedure: EXCISION MASS;  Surgeon: [name],  MD;  Location: [facility];  Service: ENT;  Laterality: N/A;  Excision uvula mass  . TONSILLECTOMY N/A 06/03/2012   Procedure: TONSILLECTOMY;  Surgeon: [name], MD;  Location: [facility];  Service: ENT;  Laterality: N/A;  . TONSILLECTOMY       OB History    Gravida  3   Para  3   Term  3   Preterm  0   AB  0   Living  3     SAB  0   IAB  0   Ectopic  0   Multiple      Live Births  3           Family History  Problem Relation Age of Onset  . Hypertension Mother   . Diabetes Father     Social History   Tobacco Use  . Smoking status: Current Every Day Smoker    Packs/day: 4.00    Years: 11.00    Pack years: 44.00    Types: Cigarettes, Cigars  . Smokeless tobacco: Never Used  . Tobacco comment: Pt declined  Vaping Use  . Vaping Use: Never used  Substance Use Topics  . Alcohol use: Not Currently    Comment: occ: last intake 4 mts ago  . Drug use: Not Currently    Types: "Crack" cocaine, Other-see comments    Comment: Patient reports hx of smoking Crack    Home Medications Prior to Admission medications   Medication Sig Start Date End Date Taking? Authorizing Provider  haloperidol decanoate (HALDOL DECANOATE) 50 MG/ML injection Inject 50 mg into the muscle every 28 (twenty-eight) days. 03/19/20  Yes [provider]  insulin detemir (LEVEMIR) 100 UNIT/ML injection Inject 0.4 mLs (40 Units total) into the skin 2 (two) times daily. 05/05/20  Yes Montine Circle, PA-C  lithium carbonate (LITHOBID) 300 MG CR tablet Take 2 tablets (600 mg total) by mouth every 12 (twelve) hours. Patient taking differently: Take 600 mg by mouth 3 (three) times daily. 05/05/20 06/04/20 Yes Montine Circle, PA-C  BANOPHEN 25 MG capsule Take 25 mg by mouth at bedtime. 04/30/20   [provider]  benztropine (COGENTIN) 1 MG tablet Take 1 mg by mouth 2 (two) times daily. 05/01/20   [provider]  cholecalciferol (VITAMIN D) 25  MCG (1000 UNIT) tablet Take 1,000 Units by mouth daily. 04/30/20   [provider]  glipiZIDE (GLUCOTROL XL) 10 MG 24 hr tablet Take 10 mg by mouth once.    [provider]  omeprazole (PRILOSEC) 20 MG capsule Take 20 mg by mouth daily. 04/30/20   [provider]  ondansetron (ZOFRAN ODT) 4 MG disintegrating tablet Take 1 tablet (4 mg total) by mouth every 8 (eight) hours as needed for nausea or vomiting. 04/24/20   Petrucelli, Samantha R, PA-C  ondansetron (ZOFRAN ODT) 4 MG disintegrating tablet Take 1 tablet (4 mg total) by mouth every 8 (eight) hours as needed for nausea or vomiting. 05/05/20   Montine Circle, PA-C  propranolol (INDERAL) 10 MG tablet Take 1 tablet (10 mg total) by mouth 2 (two) times daily.  05/05/20 06/04/20  Montine Circle, PA-C  risperidone (RISPERDAL) 4 MG tablet Take 1 tablet (4 mg total) by mouth at bedtime. 05/05/20 06/04/20  Montine Circle, PA-C  simvastatin (ZOCOR) 20 MG tablet Take 20 mg by mouth at bedtime. 08/21/19   [provider]  vitamin B-12 (CYANOCOBALAMIN) 1000 MCG tablet Take 1,000 mcg by mouth daily. 11/13/19   [provider]    Allergies    Wellbutrin [bupropion], Omnipaque [iohexol], Penicillins, Cogentin [benztropine], and Depakote er [divalproex sodium er]  Review of Systems   Review of Systems  Constitutional: Positive for fatigue.       Myalgias  All other systems reviewed and are negative.   Physical Exam Updated Vital Signs BP 124/88 (BP Location: Right Arm)   Pulse 95   Temp 98.6 F (37 C) (Oral)   Resp 18   LMP 05/04/2020   SpO2 99%   Physical Exam Vitals and nursing note reviewed.  Constitutional:      General: She is not in acute distress.    Appearance: Normal appearance. She is well-developed and well-nourished. She is obese.  HENT:     Head: Normocephalic and atraumatic.  Eyes:     Extraocular Movements: EOM normal.     Pupils: Pupils are equal, round, and reactive to light.   Cardiovascular:     Rate and Rhythm: Normal rate and regular rhythm.     Pulses: Normal pulses and intact distal pulses.     Heart sounds: Normal heart sounds. No murmur heard. No friction rub.  Pulmonary:     Effort: Pulmonary effort is normal.     Breath sounds: Normal breath sounds. No wheezing or rales.  Musculoskeletal:        General: No tenderness. Normal range of motion.     Comments: No edema  Skin:    General: Skin is warm and dry.     Findings: No rash.  Neurological:     Mental Status: She is alert and oriented to person, place, and time. Mental status is at baseline.     Cranial Nerves: No cranial nerve deficit.  Psychiatric:        Mood and Affect: Mood and affect normal.     Comments: Calm and cooperative at this time.  Easily directable.  Does not appear to be responding to internal stimuli.  Currently denying suicidal ideation.     ED Results / Procedures / Treatments   Labs (all labs ordered are listed, but only abnormal results are displayed) Labs Reviewed - No data to display  EKG None  Radiology DG Chest 2 View  Result Date: 05/04/2020 CLINICAL DATA:  30 year old female with positive COVID-19. EXAM: CHEST - 2 VIEW COMPARISON:  Chest radiograph dated 04/03/2020. FINDINGS: The heart size and mediastinal contours are within normal limits. Both lungs are clear. The visualized skeletal structures are unremarkable. IMPRESSION: No active cardiopulmonary disease. Electronically Signed   By: Anner Crete M.D.   On: 05/04/2020 19:58    Procedures Procedures (including critical care time)  Medications Ordered in ED Medications  benztropine (COGENTIN) tablet 1 mg (has no administration in time range)  cholecalciferol (VITAMIN D) tablet 1,000 Units (has no administration in time range)  glipiZIDE (GLUCOTROL XL) 24 hr tablet 10 mg (has no administration in time range)  lithium carbonate (LITHOBID) CR tablet 600 mg (has no administration in time range)   insulin detemir (LEVEMIR) injection 40 Units (has no administration in time range)  pantoprazole (PROTONIX) EC tablet 40 mg (has  no administration in time range)  ondansetron (ZOFRAN-ODT) disintegrating tablet 4 mg (has no administration in time range)  propranolol (INDERAL) tablet 10 mg (has no administration in time range)  vitamin B-12 (CYANOCOBALAMIN) tablet 1,000 mcg (has no administration in time range)    ED Course  I have reviewed the triage vital signs and the nursing notes.  Pertinent labs & imaging results that were available during my care of the patient were reviewed by me and considered in my medical decision making (see chart for details).    MDM Rules/Calculators/A&P                          Patient presenting today due to the inability to get her medications.  She has a history of cognitive delay as well as mental illness.  She is on multiple psychiatric medications and was recently hospitalized at behavioral health with change in her medications.  She is also recently started seeing a new act team and I was able to speak with them on the phone today.  They reported that they did speak with the patient this morning and they had spoken to [employer] and they were supposed to be delivering her meds today.  They will call in the morning to figure out why she didn't get the medications.  Also the pt is not due for a haldol shot until 05/10/20.  She was given a dose of her medications tonight and will get her a ride home.  Pt also can do a televisit tomorrow with her act team.  Pt is comfortable with this plan and d/ced home.   Final Clinical Impression(s) / ED Diagnoses Final diagnoses:  Suicidal ideations    Rx / DC Orders ED Discharge Orders    None       Blanchie Dessert, MD 05/06/20 1932

## 2020-05-06 NOTE — ED Triage Notes (Signed)
Per EMS- patient is Covid +   Patient was discharged from [facility] today. Patient was to receive psych meds at home, but had not arrived as of yet. patient called EMS stating that she was suicidal because her medicines had not arrived.

## 2020-05-06 NOTE — Discharge Instructions (Addendum)
Your ACT team is going to call [name] in the morning to make sure you get your medication tomorrow.  Also you are not due for a Haldol shot until 05/10/20.  You need to quarantine for 8 more days.

## 2020-05-06 NOTE — ED Notes (Signed)
Pt eloped. Pt did not tell this RN she was leaving. This RN had already told pt that medications would be here soon and that some of them were coming from pharmacy and that is what this RN was waiting on.

## 2020-05-10 NOTE — Progress Notes (Signed)
05/10/2020 @ 2:35pm   TOC CM/CSW received a call from [name]/Care Coordinator at [facility] [phone].  [name] was inquiring about pts dc plan.  CSW returned [name]'s call to inform him that TOC did not assist with pts dc plan, and to reach out to [employer], NP who may be able to assist him with an update.  [name], MSW, LCSW-A Pronouns:  She, Her, [facility] ED Transitions of CareClinical Social Worker [email or web address] [phone]

## 2020-05-20 ENCOUNTER — Other Ambulatory Visit: Payer: Self-pay

## 2020-05-20 ENCOUNTER — Emergency Department (HOSPITAL_COMMUNITY)
Admission: EM | Admit: 2020-05-20 | Discharge: 2020-05-20 | Disposition: A | Discharge status: Home / Self Care | Payer: Medicaid Other | Attending: Emergency Medicine | Admitting: Emergency Medicine

## 2020-05-20 ENCOUNTER — Encounter (HOSPITAL_COMMUNITY): Payer: Self-pay

## 2020-05-20 DIAGNOSIS — N94819 Vulvodynia, unspecified: Secondary | ICD-10-CM | POA: Diagnosis present

## 2020-05-20 DIAGNOSIS — I10 Essential (primary) hypertension: Secondary | ICD-10-CM | POA: Diagnosis not present

## 2020-05-20 DIAGNOSIS — B9689 Other specified bacterial agents as the cause of diseases classified elsewhere: Secondary | ICD-10-CM

## 2020-05-20 DIAGNOSIS — E1142 Type 2 diabetes mellitus with diabetic polyneuropathy: Secondary | ICD-10-CM | POA: Diagnosis not present

## 2020-05-20 DIAGNOSIS — Z79899 Other long term (current) drug therapy: Secondary | ICD-10-CM | POA: Diagnosis not present

## 2020-05-20 DIAGNOSIS — Z7984 Long term (current) use of oral hypoglycemic drugs: Secondary | ICD-10-CM | POA: Diagnosis not present

## 2020-05-20 DIAGNOSIS — F17290 Nicotine dependence, other tobacco product, uncomplicated: Secondary | ICD-10-CM | POA: Insufficient documentation

## 2020-05-20 DIAGNOSIS — B373 Candidiasis of vulva and vagina: Secondary | ICD-10-CM

## 2020-05-20 DIAGNOSIS — E119 Type 2 diabetes mellitus without complications: Secondary | ICD-10-CM | POA: Diagnosis not present

## 2020-05-20 DIAGNOSIS — N760 Acute vaginitis: Secondary | ICD-10-CM | POA: Insufficient documentation

## 2020-05-20 DIAGNOSIS — Z794 Long term (current) use of insulin: Secondary | ICD-10-CM | POA: Insufficient documentation

## 2020-05-20 DIAGNOSIS — F17210 Nicotine dependence, cigarettes, uncomplicated: Secondary | ICD-10-CM | POA: Insufficient documentation

## 2020-05-20 DIAGNOSIS — B3731 Acute candidiasis of vulva and vagina: Secondary | ICD-10-CM

## 2020-05-20 LAB — I-STAT BETA HCG BLOOD, ED (MC, WL, AP ONLY): I-stat hCG, quantitative: <5 m[IU]/mL (ref <5)

## 2020-05-20 LAB — WET PREP, GENITAL
Sperm: NONE SEEN
Trich, Wet Prep: NONE SEEN

## 2020-05-20 LAB — HIV ANTIBODY (ROUTINE TESTING W REFLEX): HIV Screen 4th Generation wRfx: NONREACTIVE

## 2020-05-20 MED ORDER — METRONIDAZOLE 500 MG PO TABS
500.0000 mg | ORAL_TABLET | Freq: Once | ORAL | Status: AC
  Administered 2020-05-20: 500 mg via ORAL
  Filled 2020-05-20: qty 1

## 2020-05-20 MED ORDER — FLUCONAZOLE 150 MG PO TABS
150.0000 mg | ORAL_TABLET | Freq: Once | ORAL | Status: AC
  Administered 2020-05-20: 150 mg via ORAL
  Filled 2020-05-20: qty 1

## 2020-05-20 MED ORDER — METRONIDAZOLE 500 MG PO TABS: 500.0000 mg | ORAL_TABLET | Freq: Two times a day (BID) | ORAL | 0 refills | Status: DC

## 2020-05-20 MED ORDER — FLUCONAZOLE 200 MG PO TABS: 200.0000 mg | ORAL_TABLET | Freq: Every day | ORAL | 0 refills | Status: AC

## 2020-05-20 NOTE — ED Triage Notes (Signed)
Patient c/o vaginal burning with white vaginal discharge and itching x 5 days

## 2020-05-20 NOTE — ED Provider Notes (Signed)
[facility] DEPT Provider Note   CSN: [id number] Arrival date & time: 05/20/20  4098    History Chief Complaint  Patient presents with  . vaginal burning    [name] is a 30 y.o. female with past with history significant for diabetes, bipolar, hypertension, schizoaffective sorter who presents for evaluation of vaginal burning, white discharge and pruritus.  Began 2 days ago.  States she is sexually active with multiple partners. Does not use protection. Hx of chlamydia. Denies fever, chills, N/V/D, abd pain, dysuria, hematuria, weakness. Has not taken anything for symptoms. Denies additional aggravating or alleviating factors.  History obtained from patient and past medical records. No interpretor was used.  HPI     Past Medical History:  Diagnosis Date  . Anxiety   . Bipolar 1 disorder ([place])   . Cognitive deficits   . Depression   . Diabetes mellitus without complication ([place])   . Hypertension   . Mental disorder   . Mental health disorder   . Obesity     Patient Active Problem List   Diagnosis Date Noted  . Gastroesophageal reflux disease 05/04/2020  . Hyperglycemia due to type 2 diabetes mellitus ([place]) 05/04/2020  . Long term (current) use of insulin ([place]) 05/04/2020  . Migraine without aura 05/04/2020  . Morbid obesity ([place]) 05/04/2020  . 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 2 diabetes mellitus ([place]) 05/04/2020  . Prolapsed internal hemorrhoids 05/04/2020  . Vitamin D deficiency 05/04/2020  . Other symptoms and signs involving cognitive functions and awareness 05/04/2020  . Suicide attempt ([place])   . Anxiety state 03/06/2020  . Schizophrenia ([place]) 09/13/2019  . Bipolar 1 disorder ([place]) 06/23/2019  . MDD (major depressive disorder) 10/10/2018  . Schizoaffective disorder, bipolar type ([place]) 09/25/2018  . Bipolar I disorder ([place]) 06/13/2018  . HTN (hypertension) 05/03/2018  . Tobacco use disorder 05/03/2018  . Adjustment disorder with  emotional disturbance 01/02/2018  . 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 Moderate bipolar I disorder, most recent episode depressed ([place])   . Psychosis ([place])   . 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 OCD (obsessive compulsive disorder) 10/05/2016  . Major depressive disorder, recurrent episode, mild ([place]) 05/04/2016  . 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 Learning disability 07/18/2015  . 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 Diabetes mellitus ([place]) 07/18/2015  . 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 Hyperlipidemia 07/18/2015  . Severe episode of recurrent major depressive disorder, without psychotic features ([place])   . Suicidal ideation   . Drug overdose   . Cognitive deficits 10/12/2012  . Generalized anxiety disorder 06/28/2012    Past Surgical History:  Procedure Laterality Date  . CESAREAN SECTION    . CESAREAN SECTION N/A 04/25/2013   Procedure: REPEAT CESAREAN SECTION;  Surgeon: [name], MD;  Location: [facility];  Service: Obstetrics;  Laterality: N/A;  . MASS EXCISION N/A 06/03/2012   Procedure: EXCISION MASS;  Surgeon: [name], MD;  Location: [facility];  Service: ENT;  Laterality: N/A;  Excision uvula mass  . TONSILLECTOMY N/A 06/03/2012   Procedure: TONSILLECTOMY;  Surgeon: [name], MD;  Location: [facility];  Service: ENT;  Laterality: N/A;  . TONSILLECTOMY       OB History    Gravida  3   Para  3   Term  3   Preterm  0   AB  0  Living  3     SAB  0   IAB  0   Ectopic  0   Multiple      Live Births  3           Family History  Problem Relation Age of Onset  . Hypertension Mother   . Diabetes Father     Social History   Tobacco Use  . Smoking status: Current Every Day Smoker    Packs/day: 4.00    Years: 11.00    Pack years: 44.00    Types:  Cigarettes, Cigars  . Smokeless tobacco: Never Used  . Tobacco comment: Pt declined  Vaping Use  . Vaping Use: Never used  Substance Use Topics  . Alcohol use: Not Currently    Comment: occ: last intake 4 mts ago  . Drug use: Not Currently    Types: "Crack" cocaine, Other-see comments    Comment: Patient reports hx of smoking Crack    Home Medications Prior to Admission medications   Medication Sig Start Date End Date Taking? Authorizing Provider  fluconazole (DIFLUCAN) 200 MG tablet Take 1 tablet (200 mg total) by mouth daily for 1 day. Take 1 pill if symptoms unresolved at day 3 05/20/20 05/21/20 Yes Ludella Pranger A, PA-C  metroNIDAZOLE (FLAGYL) 500 MG tablet Take 1 tablet (500 mg total) by mouth 2 (two) times daily. 05/20/20  Yes Oaklie Durrett A, PA-C  BANOPHEN 25 MG capsule Take 25 mg by mouth at bedtime. 04/30/20   [provider]  benztropine (COGENTIN) 1 MG tablet Take 1 mg by mouth 2 (two) times daily. 05/01/20   [provider]  cholecalciferol (VITAMIN D) 25 MCG (1000 UNIT) tablet Take 1,000 Units by mouth daily. 04/30/20   [provider]  glipiZIDE (GLUCOTROL XL) 10 MG 24 hr tablet Take 10 mg by mouth once.    [provider]  haloperidol (HALDOL) 10 MG tablet Take 10 mg by mouth at bedtime. 05/09/20   [provider]  haloperidol decanoate (HALDOL DECANOATE) 50 MG/ML injection Inject 50 mg into the muscle every 28 (twenty-eight) days. 03/19/20   [provider]  insulin detemir (LEVEMIR) 100 UNIT/ML injection Inject 0.4 mLs (40 Units total) into the skin 2 (two) times daily. 05/05/20   Montine Circle, PA-C  lithium carbonate (LITHOBID) 300 MG CR tablet Take 2 tablets (600 mg total) by mouth every 12 (twelve) hours. Patient taking differently: Take 600 mg by mouth 3 (three) times daily. 05/05/20 06/04/20  Montine Circle, PA-C  omeprazole (PRILOSEC) 20 MG capsule Take 20 mg by mouth daily. 04/30/20   [provider]   ondansetron (ZOFRAN ODT) 4 MG disintegrating tablet Take 1 tablet (4 mg total) by mouth every 8 (eight) hours as needed for nausea or vomiting. 04/24/20   Petrucelli, Samantha R, PA-C  ondansetron (ZOFRAN ODT) 4 MG disintegrating tablet Take 1 tablet (4 mg total) by mouth every 8 (eight) hours as needed for nausea or vomiting. 05/05/20   Montine Circle, PA-C  propranolol (INDERAL) 10 MG tablet Take 1 tablet (10 mg total) by mouth 2 (two) times daily. 05/05/20 06/04/20  Montine Circle, PA-C  risperidone (RISPERDAL) 4 MG tablet Take 1 tablet (4 mg total) by mouth at bedtime. 05/05/20 06/04/20  Montine Circle, PA-C  simvastatin (ZOCOR) 20 MG tablet Take 20 mg by mouth at bedtime. 08/21/19   [provider]  VISTARIL 25 MG capsule Take 25 mg by mouth at bedtime. 05/09/20   [provider]  vitamin B-12 (CYANOCOBALAMIN) 1000 MCG tablet Take 1,000 mcg by mouth daily. 11/13/19   [provider]   Allergies    Wellbutrin [bupropion], Omnipaque [iohexol], Penicillins, Cogentin [benztropine], and Depakote er [divalproex sodium er]  Review of Systems   Review of Systems  Constitutional: Negative.   HENT: Negative.   Respiratory: Negative.   Cardiovascular: Negative.   Gastrointestinal: Negative.   Genitourinary: Positive for vaginal discharge. Negative for decreased urine volume, difficulty urinating, dysuria, flank pain, frequency, genital sores, hematuria, menstrual problem, pelvic pain, urgency, vaginal bleeding and vaginal pain.  Musculoskeletal: Negative.   Skin: Negative.   Neurological: Negative.   All other systems reviewed and are negative.   Physical Exam Updated Vital Signs BP 129/79 (BP Location: Right Arm)   Pulse 92   Temp (!) 97.5 F (36.4 C) (Oral)   Resp 17   Ht 5\' 7"  (1.702 m)   Wt 114 kg   LMP 05/04/2020   SpO2 98%   BMI 39.37 kg/m   Physical Exam Vitals and nursing note reviewed. Exam conducted with a chaperone present.  Constitutional:       General: She is not in acute distress.    Appearance: She is well-developed and well-nourished. She is not ill-appearing, toxic-appearing or diaphoretic.  HENT:     Head: Normocephalic and atraumatic.     Nose: Nose normal.     Mouth/Throat:     Mouth: Mucous membranes are moist.  Eyes:     Pupils: Pupils are equal, round, and reactive to light.  Cardiovascular:     Rate and Rhythm: Normal rate.     Pulses: Normal pulses and intact distal pulses.     Heart sounds: Normal heart sounds.  Pulmonary:     Effort: Pulmonary effort is normal. No respiratory distress.     Breath sounds: Normal breath sounds.  Abdominal:     General: Bowel sounds are normal. There is no distension.     Palpations: There is no mass.     Tenderness: There is no abdominal tenderness. There is no guarding or rebound.     Hernia: No hernia is present.  Genitourinary:    Comments: Normal appearing external female genitalia without rashes or lesions, normal vaginal epithelium. Normal appearing cervix. Thick white discharge to cervical vault.or petechiae. Cervical os is closed. There is no bleeding noted at the os. No odor. Bimanual: No CMT, nontender.  No palpable adnexal masses or tenderness. Uterus midline and not fixed. Rectovaginal exam was deffered.  No cystocele or rectocele noted. No pelvic lymphadenopathy noted. Wet prep was obtained.  Cultures for gonorrhea and chlamydia collected. Exam performed with chaperone in room. Musculoskeletal:        General: No swelling or tenderness. Normal range of motion.     Cervical back: Normal range of motion.     Right lower leg: No edema.     Left lower leg: No edema.  Skin:    General: Skin is warm and dry.     Capillary Refill: Capillary refill takes less than 2 seconds.  Neurological:     General: No focal deficit present.     Mental Status: She is alert and oriented to person, place, and time.  Psychiatric:        Mood and Affect: Mood and affect normal.     ED  Results / Procedures / Treatments   Labs (all labs ordered are listed, but only abnormal results are displayed) Labs Reviewed  WET PREP, GENITAL - Abnormal;  Notable for the following components:      Result Value   Yeast Wet Prep HPF POC PRESENT (*)    Clue Cells Wet Prep HPF POC PRESENT (*)    WBC, Wet Prep HPF POC MANY (*)    All other components within normal limits  URINE CULTURE  RPR  URINALYSIS, ROUTINE W REFLEX MICROSCOPIC  HIV ANTIBODY (ROUTINE TESTING W REFLEX)  I-STAT BETA HCG BLOOD, ED ([facility], [facility], [facility] ONLY)  GC/CHLAMYDIA PROBE AMP ([place]) NOT AT [facility]   EKG None  Radiology No results found.  Procedures Procedures   Medications Ordered in ED Medications  fluconazole (DIFLUCAN) tablet 150 mg (has no administration in time range)  metroNIDAZOLE (FLAGYL) tablet 500 mg (has no administration in time range)    ED Course  I have reviewed the triage vital signs and the nursing notes.  Pertinent labs & imaging results that were available during my care of the patient were reviewed by me and considered in my medical decision making (see chart for details).  30 year old presents for evaluation of vaginal discharge.  Sexually active with multiple partners.  Does have a long psychiatric history however appears to have capacity make medical decisions at this time. Appear appropriate in room.  Denies SI, HI, AVH.  GU exam with nurse and tech in room shows, weight, thick discharge vaginal vault.  No CMT or adnexal tenderness.  Abdomen soft, nontender.  Low suspicion for PID on exam  Wet prep does show BV, yeast.  GC, chlamydia, HIV, syphilis pending.  Will treat for BV and yeast at discharge.  Patient does not meet the SIRS or Sepsis criteria.  On repeat exam patient does not have a surgical abdomin and there are no peritoneal signs.  No indication of appendicitis, bowel obstruction, bowel perforation, cholecystitis, diverticulitis, PID or ectopic pregnancy, TOA, torsion.  The  patient has been appropriately medically screened and/or stabilized in the ED. I have low suspicion for any other emergent medical condition which would require further screening, evaluation or treatment in the ED or require inpatient management.  Patient is hemodynamically stable and in no acute distress.  Patient able to ambulate in department prior to ED.  Evaluation does not show acute pathology that would require ongoing or additional emergent interventions while in the emergency department or further inpatient treatment.  I have discussed the diagnosis with the patient and answered all questions.  Pain is been managed while in the emergency department and patient has no further complaints prior to discharge.  Patient is comfortable with plan discussed in room and is stable for discharge at this time.  I have discussed strict return precautions for returning to the emergency department.  Patient was encouraged to follow-up with PCP/specialist refer to at discharge.    MDM Rules/Calculators/A&P                          Final Clinical Impression(s) / ED Diagnoses Final diagnoses:  Yeast vaginitis  BV (bacterial vaginosis)    Rx / DC Orders ED Discharge Orders         Ordered    metroNIDAZOLE (FLAGYL) 500 MG tablet  2 times daily        05/20/20 1116    fluconazole (DIFLUCAN) 200 MG tablet  Daily        05/20/20 1116           Camani Sesay A, PA-C 05/20/20 1117    Dorie Rank, MD  05/21/20 0848  

## 2020-05-20 NOTE — Discharge Instructions (Signed)
Your wet prep was positive for bacterial vaginosis which is an overgrowth of the normal bacteria in your vagina as well as a yeast infection.  We have treated you with medication for this.  Your gonorrhea, syphilis, chlamydia, HIV testing is pending.  You will be called if this is positive.  This is positive you will need to seek reevaluation.

## 2020-05-21 LAB — GC/CHLAMYDIA PROBE AMP (~~LOC~~) NOT AT ARMC
Chlamydia: NEGATIVE
Comment: NEGATIVE
Comment: NORMAL
Neisseria Gonorrhea: NEGATIVE

## 2020-05-21 LAB — RPR: RPR Ser Ql: NONREACTIVE

## 2020-05-22 ENCOUNTER — Ambulatory Visit (HOSPITAL_COMMUNITY)
Admission: EM | Admit: 2020-05-22 | Discharge: 2020-05-22 | Disposition: A | Discharge status: Home / Self Care | Payer: Medicaid Other | Attending: Psychiatry | Admitting: Psychiatry

## 2020-05-22 ENCOUNTER — Other Ambulatory Visit: Payer: Self-pay

## 2020-05-22 ENCOUNTER — Encounter (HOSPITAL_COMMUNITY): Payer: Self-pay

## 2020-05-22 DIAGNOSIS — R45851 Suicidal ideations: Secondary | ICD-10-CM | POA: Insufficient documentation

## 2020-05-22 DIAGNOSIS — F330 Major depressive disorder, recurrent, mild: Secondary | ICD-10-CM | POA: Insufficient documentation

## 2020-05-22 DIAGNOSIS — Z602 Problems related to living alone: Secondary | ICD-10-CM | POA: Insufficient documentation

## 2020-05-22 NOTE — ED Provider Notes (Signed)
[facility] Medical Screening Exam  Patient Name: [name] MRN: [id number] Date of Evaluation: 05/22/20 Chief Complaint: Chief Complaint/Presenting Problem: Pt endorsed suicidal ideation, loneliness Diagnosis:  Final diagnoses:  Major depressive disorder, recurrent episode, mild ([place])    History of Present illness: [name] is a 30 y.o. female.  Patient presents voluntarily as a walk-in via [place] [employer] to the [place] [facility].  Patient first reports that she presented here because she got COVID vaccine reported that she was feeling depressed and suicidal.  However once discussing with the patient she reports that she is very lonely staying by herself at her home.  She states that she cannot get anybody to come over and stay with her or to spend time with her.  She reports that she has had a lot of issues with her mother in the past and she has even tried to get her mother to come and stay with her.  She states that [facility] with [facility] and that they have been good to her and states that she just wants them to get her into a group home.  She continues reporting that she really just wants to be around people more often because she feels that she does not have any friends and she does not have anybody to talk to.  She states that the only people that comes over to her house so the people who want to have sex with her and they are there for approximately 5 minutes and then they leave.  She states that nobody will take her for a ride or to even stay at her house and hang out with her. I have contacted [name] with [facility] and he reports that the patient has been doing extremely well but is very lonely.  He reports that she is constantly calling the act team requesting them to come by and see her.  He states that they have been trying to assist with the patient get into a group home but this takes time.  After discussing with him about the patient's concerns he is agreed to  discuss with his ACT team to possibly get her into a day program so that she has activities to do in people to be around.  He states that he has no concerns with the patient discharging home. Patient is informed of the plan with [name] and she states that she feels that that is a good plan.  She also agrees to contact her ACT team tomorrow morning after their morning meeting.  She then denies having any suicidal or homicidal ideations and denies any hallucinations and then asked me for transportation back to her home.  Patient is compliant with her medications and has follow-up with her ACT team.  Psychiatric Specialty Exam  Presentation  General Appearance:Appropriate for Environment; Casual; Well Groomed  Eye Contact:Good  Speech:Clear and Coherent; Normal Rate  Speech Volume:Normal  Handedness:Right   Mood and Affect  Mood:Depressed  Affect:Appropriate; Congruent   Thought Process  Thought Processes:Coherent  Descriptions of Associations:Intact  Orientation:Full (Time, Place and Person)  Thought Content:WDL  Hallucinations:None  Ideas of Reference:None  Suicidal Thoughts:No With Plan; With Intent; With Means to Carry Out; With Access to Means (Patient states she has SI with plan to overdose on her medications.)  Homicidal Thoughts:No   Sensorium  Memory:Immediate Good; Recent Good; Remote Good  Judgment:Fair  Insight:Fair   Executive Functions  Concentration:Good  Attention Span:Good  Recall:Good  Fund of Knowledge:Good  Language:Good   Psychomotor Activity  Psychomotor Activity:Normal   Assets  Assets:Communication Skills; Desire for Improvement; Financial Resources/Insurance; Housing; Physical Health; Social Support   Sleep  Sleep:Fair  Number of hours: 5   Physical Exam: Physical Exam Vitals and nursing note reviewed.  Constitutional:      Appearance: She is well-developed.  HENT:     Head: Normocephalic.  Eyes:     Pupils:  Pupils are equal, round, and reactive to light.  Cardiovascular:     Rate and Rhythm: Normal rate.  Pulmonary:     Effort: Pulmonary effort is normal.  Musculoskeletal:        General: Normal range of motion.  Neurological:     Mental Status: She is alert and oriented to person, place, and time.    Review of Systems  Constitutional: Negative.   HENT: Negative.   Eyes: Negative.   Respiratory: Negative.   Cardiovascular: Negative.   Gastrointestinal: Negative.   Genitourinary: Negative.   Musculoskeletal: Negative.   Skin: Negative.   Neurological: Negative.   Endo/Heme/Allergies: Negative.   Psychiatric/Behavioral: Positive for depression.   Blood pressure 110/74, pulse 95, temperature 98 F (36.7 C), temperature source Oral, resp. rate 18, last menstrual period 05/04/2020, SpO2 100 %. There is no height or weight on file to calculate BMI.  Musculoskeletal: Strength & Muscle Tone: within normal limits Gait & Station: normal Patient leans: N/A   [facility] MSE Discharge Disposition for Follow up and Recommendations: Based on my evaluation the patient does not appear to have an emergency medical condition and can be discharged with resources and follow up care in outpatient services for day program and medication management by [facility]   [place], FNP 05/22/2020, 4:26 PM

## 2020-05-22 NOTE — ED Notes (Signed)
Patient A&O x 4, ambulatory. Patient discharged in no acute distress. Patient denied SI/HI, A/VH upon discharge. Patient verbalized understanding of all discharge instructions explained by staff, to include follow up appointments and safety plan. Pt belongings returned to patient from locker #25 intact. Patient escorted to [place] for transport to destination (home). Safety maintained.

## 2020-05-22 NOTE — Discharge Instructions (Addendum)

## 2020-05-22 NOTE — BH Assessment (Signed)
Comprehensive Clinical Assessment (CCA) Note  05/22/2020 [name] [id number]  Chief Complaint:  Chief Complaint  Patient presents with  . Suicidal  . Anxiety   Visit Diagnosis: Major Depressive Disorder, Recurrent, MIld  NARRATIVE:  Pt is a 30 year old female who presented to [facility] on voluntary basis with complaint of suicidal ideation, despondency, and other symptoms.  Pt is followed by [facility], and she is on disability.  Pt has made several presentations to [facility] EDs with similar complaints.  Pt was treated by [name] for a suicide attempt by overdose in early January.  Pt reported that she came to the ED because she is depressed and feels suicidal.  Trigger for mood is the COVID vaccine she received today.  Pt stated that she is upset with her ACT Team because she believes they are slow in securing her group home placement.  Pt stated that she cannot tolerate living alone, and she told attending NP that she came in because she felt ''lonely.''  Pt denied homicidal ideation, hallucination, self-injurious behavior, and substance use concerns.  Pt initially stated that if discharged, she would harm herself, and then she admitted that she came in because she was lonely and wanted to talk.  During assessment, Pt presented as alert and oriented. She had good eye contact.  Demeanor was irritable.  Pt's mood was reported as sad.  Affect was irritable.  Pt's speech was loud but otherwise normal in rate and rhythm.  Pt's thought processes were within normal range, and thought content was logical and goal-oriented.  There was no evidence of delusion.  Pt's memory and concentration were intact.  Insight, judgment, and impulse control were fair.  Pt was also assessed by T. Money, NP, who also consulted with Pt's psychiatrist, Dr. Sheppard [name].  It was determined that Pt was safe for discharge.  Pt is psych-cleared.    CCA Screening, Triage and Referral (STR)  Patient Reported Information How did  you hear about [place]? Self  Referral name: [name] 05/22/2020)  Referral phone number: No data recorded  Whom do you see for routine medical problems? Primary Care  Practice[facility]Facility Name: [name]  Practice/Facility Phone Number: No data recorded Name of Contact: [name] (Phreesia 05/22/2020)  Contact Number: [phone] ([place] 04/11/2020)  Contact Fax Number: [phone] ([place] 04/11/2020)  Prescriber Name: Unknown [name] 05/22/2020)  Prescriber Address (if known): Unknown (Phreesia 05/22/2020)   What Is the Reason for Your Visit/Call Today? Suicidal, depressed  How Long Has This Been Causing You Problems? 1-6 months  What Do You Feel Would Help You the Most Today? Therapy; Medication   Have You Recently Been in Any Inpatient Treatment (Hospital/Detox/Crisis [name]/28-Day Program)? Yes  Name/Location of Program/Hospital:[facility]  How Long Were You There? 5 days  When Were You Discharged? 05/04/2020   Have You Ever Received Services From [facility] Before? Yes  Who Do You See at [name]   Have You Recently Had Any Thoughts About Hurting Yourself? Yes  Are You Planning to Commit Suicide/Harm Yourself At This time? Yes   Have you Recently Had Thoughts About Hurting Someone [name]? No  Explanation: No data recorded  Have You Used Any Alcohol or Drugs in the Past 24 Hours? No  How Long Ago Did You Use Drugs or Alcohol? No data recorded What Did You Use and How Much? No data recorded  Do You Currently Have a Therapist/Psychiatrist? No  Name of Therapist/Psychiatrist: [name]   Have You Been  Recently Discharged From Any Mudlogger or Programs? No  Explanation of Discharge From Practice/Program: No data recorded    CCA Screening Triage Referral Assessment Type of Contact: Face-to-Face  Is this Initial or Reassessment? Initial Assessment  Date Telepsych consult ordered in [facility]:  04/10/2020  Time  Telepsych consult ordered in [facility]:  1728   Patient Reported Information Reviewed? Yes  Patient Left Without Being Seen? No data recorded Reason for Not Completing Assessment: No data recorded  Collateral Involvement: pt declines family collateral contact and [name]   Does Patient Have a Court Appointed Legal Guardian? No data recorded Name and Contact of Legal Guardian: Self.   If Minor and Not Living with Parent(s), Who has Custody? Self  Is CPS involved or ever been involved? In the Past  Is APS involved or ever been involved? Never   Patient Determined To Be At Risk for Harm To Self or Others Based on Review of Patient Reported Information or Presenting Complaint? No  Method: No data recorded Availability of Means: No data recorded Intent: No data recorded Notification Required: No data recorded Additional Information for Danger to Others Potential: No data recorded Additional Comments for Danger to Others Potential: No data recorded Are There Guns or Other Weapons in Your Home? No data recorded Types of Guns/Weapons: No data recorded Are These Weapons Safely Secured?                            No data recorded Who Could Verify You Are Able To Have These Secured: No data recorded Do You Have any Outstanding Charges, Pending Court Dates, Parole/Probation? No data recorded Contacted To Inform of Risk of Harm To Self or Others: -- (TTS to attempt contact with [name])   Location of Assessment: [facility] Assessment Services   Does Patient Present under Involuntary Commitment? No  IVC Papers Initial File Date: No data recorded  [place] of Residence: [place]   Patient Currently Receiving the Following Services: [name])   Determination of Need: Urgent (48 hours)   Options For Referral: Outpatient Therapy; Medication Management     CCA Biopsychosocial Intake/Chief Complaint:  Pt endorsed suicidal ideation, loneliness  Current  Symptoms/Problems: Suicidal ideation, loneliness, despondency   Patient Reported Schizophrenia/Schizoaffective Diagnosis in Past: Yes   Strengths: Communicative  Preferences: Pt wants to be placed in a group home --''I can't live independently''  Abilities: No data recorded  Type of Services Patient Feels are Needed: Housing   Initial Clinical Notes/Concerns: Pt has numerous presentations to the ED, [place], and [place].  Pt endorsed suicidal ideation.  She stated to NP that she is lonely.   Mental Health Symptoms Depression:  Change in energy/activity; Hopelessness; Increase/decrease in appetite; Irritability; Sleep (too much or little); Weight gain/loss; Worthlessness; Fatigue   Duration of Depressive symptoms: Greater than two weeks   Mania:  N/A   Anxiety:   N/A   Psychosis:  None   Duration of Psychotic symptoms: No data recorded  Trauma:  None   Obsessions:  None   Compulsions:  None   Inattention:  None   Hyperactivity/Impulsivity:  N/A   Oppositional/Defiant Behaviors:  None   Emotional Irregularity:  Recurrent suicidal behaviors/gestures/threats; Mood lability; Unstable self-image   Other Mood/Personality Symptoms:  NA    Mental Status Exam Appearance and self-care  Stature:  Average   Weight:  Overweight   Clothing:  Casual   Grooming:  Normal  Cosmetic use:  Age appropriate   Posture/gait:  Normal   Motor activity:  Not Remarkable   Sensorium  Attention:  Normal   Concentration:  Normal   Orientation:  X5   Recall/memory:  Normal   Affect and Mood  Affect:  Constricted; Depressed   Mood:  Depressed; Irritable   Relating  Eye contact:  Normal   Facial expression:  Constricted   Attitude toward examiner:  Irritable; Cooperative   Thought and Language  Speech flow: Loud; Clear and Coherent   Thought content:  Appropriate to Mood and Circumstances   Preoccupation:  None   Hallucinations:  None   Organization:  No data  recorded  [employer] of Knowledge:  Average   Intelligence:  Average   Abstraction:  Normal   Judgement:  Fair   Reality Testing:  Adequate   Insight:  Fair   Decision Making:  Vacilates   Social Functioning  Social Maturity:  Isolates   Social Judgement:  Victimized   Stress  Stressors:  Housing; Relationship   Coping Ability:  Exhausted   Skill Deficits:  Decision making   Supports:  Other (Comment); Family     Religion: Religion/Spirituality Are You A Religious Person?: Yes  Leisure/Recreation: Leisure / Recreation Do You Have Hobbies?: No  Exercise/Diet: Exercise/Diet Do You Exercise?: Yes What Type of Exercise Do You Do?: Run/Walk How Many Times a Week Do You Exercise?: 6-7 times a week Have You Gained or Lost A Significant Amount of Weight in the Past Six Months?: No Do You Follow a Special Diet?: No Do You Have Any Trouble Sleeping?: Yes Explanation of Sleeping Difficulties: insomnia, nightmares   CCA Employment/Education Employment/Work Situation: Employment / Work Situation Employment situation: On disability Why is patient on disability: Bipolar I Patient's job has been impacted by current illness: No Has patient ever been in the [employer]?: No  Education: Education Is Patient Currently Attending School?: No Did You Have An Individualized Education Program (IIEP): No Did You Have Any Difficulty At [employer]?: No   CCA Family/Childhood History Family and Relationship History: Family history Marital status: Single Are you sexually active?: Yes What is your sexual orientation?: heterosexua; Does patient have children?: Yes How many children?: 3 How is patient's relationship with their children?: Three daughters, ages 41, 44, 11, who are in foster care and adoption process  Childhood History:  Childhood History By whom was/is the patient raised?: Mother Additional childhood history information: Patient reports "I only seen  him (father) about three times in my life and now he don't want nothing to do with me." Description of patient's relationship with caregiver when they were a child: Patient reports "it was good, she raised me good".  How were you disciplined when you got in trouble as a child/adolescent?: Patient reports "nothing". Did patient suffer any verbal/emotional/physical/sexual abuse as a child?: No Has patient ever been sexually abused/assaulted/raped as an adolescent or adult?: No Has patient been affected by domestic violence as an adult?: No  Child/Adolescent Assessment:     CCA Substance Use Alcohol/Drug Use: Alcohol / Drug Use Pain Medications: Please see MAR Prescriptions: Please see MAR Over the Counter: Please see MAR History of alcohol / drug use?: No history of alcohol / drug abuse                         ASAM's:  Six Dimensions of Multidimensional Assessment  Dimension 1:  Acute Intoxication and/or Withdrawal Potential:  Dimension 2:  Biomedical Conditions and Complications:      Dimension 3:  Emotional, Behavioral, or Cognitive Conditions and Complications:     Dimension 4:  Readiness to Change:     Dimension 5:  Relapse, Continued use, or Continued Problem Potential:     Dimension 6:  Recovery/Living Environment:     ASAM Severity Score:    ASAM Recommended Level of Treatment:     Substance use Disorder (SUD)    Recommendations for Services/Supports/Treatments:    DSM5 Diagnoses: Patient Active Problem List   Diagnosis Date Noted  . Gastroesophageal reflux disease 05/04/2020  . Hyperglycemia due to type 2 diabetes mellitus ([place]) 05/04/2020  . Long term (current) use of insulin ([place]) 05/04/2020  . Migraine without aura 05/04/2020  . Morbid obesity ([place]) 05/04/2020  . 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 2 diabetes mellitus ([place]) 05/04/2020  . Prolapsed internal hemorrhoids 05/04/2020  . Vitamin D deficiency 05/04/2020  . Other symptoms and signs involving  cognitive functions and awareness 05/04/2020  . Suicide attempt ([place])   . Anxiety state 03/06/2020  . Schizophrenia ([place]) 09/13/2019  . Bipolar 1 disorder ([place]) 06/23/2019  . MDD (major depressive disorder) 10/10/2018  . Schizoaffective disorder, bipolar type ([place]) 09/25/2018  . Bipolar I disorder ([place]) 06/13/2018  . HTN (hypertension) 05/03/2018  . Tobacco use disorder 05/03/2018  . Adjustment disorder with emotional disturbance 01/02/2018  . 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 Moderate bipolar I disorder, most recent episode depressed ([place])   . Psychosis ([place])   . 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 OCD (obsessive compulsive disorder) 10/05/2016  . Major depressive disorder, recurrent episode, mild ([place]) 05/04/2016  . 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 Learning disability 07/18/2015  . 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 Diabetes mellitus ([place]) 07/18/2015  . MDD (major depressive disorder), recurrent, severe, with psychosis ([place]) 07/18/2015  . Hyperlipidemia 07/18/2015  . Severe episode of recurrent major depressive disorder, without psychotic features ([place])   . Suicidal ideation   . Drug overdose   . Cognitive deficits 10/12/2012  . Generalized anxiety disorder 06/28/2012    Patient Centered Plan: Patient is on the following Treatment Plan(s):     Referrals to Alternative Service(s): Referred to Alternative Service(s):   Place:   Date:   Time:    Referred to Alternative Service(s):   Place:   Date:   Time:    Referred to Alternative Service(s):   Place:   Date:   Time:    Referred to Alternative Service(s):   Place:   Date:   Time:     Marlowe Aschoff, [name]

## 2020-05-22 NOTE — ED Notes (Signed)
LOCKER 27 

## 2020-05-22 NOTE — ED Triage Notes (Signed)
Pt bought in by [employer] with complaints of feeling suicidal and increased anxiety. [employer] officer states pt called them stating she just took the COVID injection and the medicine made her feel suicidal. Pt presents with rapid leg movements while sitting still and limited conversion with staff. Pt endorse SI. Urgent visit.

## 2020-05-23 ENCOUNTER — Other Ambulatory Visit: Payer: Self-pay

## 2020-05-23 ENCOUNTER — Encounter (HOSPITAL_COMMUNITY): Payer: Self-pay

## 2020-05-23 ENCOUNTER — Ambulatory Visit (HOSPITAL_COMMUNITY)
Admission: EM | Admit: 2020-05-23 | Discharge: 2020-05-23 | Disposition: A | Discharge status: Home / Self Care | Payer: Medicaid Other | Attending: Psychiatry | Admitting: Psychiatry

## 2020-05-23 DIAGNOSIS — F3130 Bipolar disorder, current episode depressed, mild or moderate severity, unspecified: Secondary | ICD-10-CM

## 2020-05-23 DIAGNOSIS — F411 Generalized anxiety disorder: Secondary | ICD-10-CM

## 2020-05-23 DIAGNOSIS — F419 Anxiety disorder, unspecified: Secondary | ICD-10-CM | POA: Diagnosis not present

## 2020-05-23 MED ORDER — GABAPENTIN 300 MG PO CAPS
300.0000 mg | ORAL_CAPSULE | Freq: Once | ORAL | Status: AC
  Administered 2020-05-23: 300 mg via ORAL
  Filled 2020-05-23: qty 1

## 2020-05-23 MED ORDER — GABAPENTIN 300 MG PO CAPS: 300.0000 mg | ORAL_CAPSULE | Freq: Three times a day (TID) | ORAL | 0 refills | Status: DC

## 2020-05-23 MED ORDER — LORAZEPAM 1 MG PO TABS
1.0000 mg | ORAL_TABLET | Freq: Once | ORAL | Status: AC
  Administered 2020-05-23: 1 mg via ORAL
  Filled 2020-05-23: qty 1

## 2020-05-23 NOTE — ED Provider Notes (Signed)
Behavioral Health Urgent Care Medical Screening Exam  Patient Name: [name] MRN: [id number] Date of Evaluation: 05/23/20 Chief Complaint:   Diagnosis:  Final diagnoses:  Anxiety state    History of Present illness: [name] is a 30 y.o. female.  Patient is well-known to this provider and the rest of the psychiatric department was called.  Patient presents voluntarily as a walk-in to the [place] and at first is reporting having suicidal ideations due to her anxiety.  Upon evaluation the patient is sitting in the chair and she is bouncing her leg and does appear to be very anxious.  She states that she is trying to do what she was asked to do yesterday and she got out of her house some and went out and walked around and was not bored anymore however she stated that she was feeling extremely anxious while she was out.  After discussing with patient the possibilities of what she needed assistance with she stated that she does need something to help calm her anxiety.  Contacted the pharmacy where she gets her medications filled the patient is prescribed lithium 450 mg p.o. daily, Cogentin 1 mg p.o. twice daily, Haldol Decanoate 150 mg IM every 30 days, Haldol 10 mg p.o. nightly, prazosin 5 mg p.o. nightly, Vistaril 25 mg p.o. 3 times daily as needed, and propanolol 10 mg p.o. twice daily.  [facility] is current with [facility] and I have contacted [name] to discuss medications with him.  As patient does appear to be extremely anxious she is agreed to start gabapentin 300 mg p.o. 3 times daily and this was agreed upon with [name] as well.  Due to patient's anxiety at the time of evaluation she was provided with 1 mg of Ativan p.o. and patient was informed that this is the only dose that she is allowed to be given.  [name] felt that the patient was safe for discharge and there were no safety concerns.  Patient continues to report that she does feel lonely but feels that this is a good plan  and that she has taken gabapentin in the past and that makes her feel better knowing that she has a medication to assist her.  Patient's ACT team stated they will follow up and assist her with her medications tomorrow.  Patient does not endorse any suicidal or homicidal ideations and denies any hallucinations.  Psychiatric Specialty Exam  Presentation  General Appearance:Appropriate for Environment; Casual; Well Groomed  Eye Contact:Good  Speech:Clear and Coherent; Normal Rate  Speech Volume:Normal  Handedness:Right   Mood and Affect  Mood:Anxious  Affect:Appropriate; Congruent   Thought Process  Thought Processes:Coherent  Descriptions of Associations:Intact  Orientation:Full (Time, Place and Person)  Thought Content:WDL  Hallucinations:None  Ideas of Reference:None  Suicidal Thoughts:No With Plan; With Intent; With Means to Carry Out; With Access to Means (Patient states she has SI with plan to overdose on her medications.)  Homicidal Thoughts:No   Sensorium  Memory:Immediate Good; Recent Good; Remote Good  Judgment:Fair  Insight:Fair   Executive Functions  Concentration:Good  Attention Span:Good  [place]  Language:Good   Psychomotor Activity  Psychomotor Activity:Normal   Assets  Assets:Communication Skills; Desire for Improvement; Financial Resources/Insurance; Housing; Social Support; Physical Health   Sleep  Sleep:Fair  Number of hours: 5   Physical Exam: Physical Exam Vitals and nursing note reviewed.  Constitutional:      Appearance: She is well-developed.  HENT:  Head: Normocephalic.  Eyes:     Pupils: Pupils are equal, round, and reactive to light.  Cardiovascular:     Rate and Rhythm: Normal rate.  Pulmonary:     Effort: Pulmonary effort is normal.  Musculoskeletal:        General: Normal range of motion.  Neurological:     Mental Status: She is alert and oriented to person, place, and  time.    Review of Systems  Constitutional: Negative.   HENT: Negative.   Eyes: Negative.   Respiratory: Negative.   Cardiovascular: Negative.   Gastrointestinal: Negative.   Genitourinary: Negative.   Musculoskeletal: Negative.   Skin: Negative.   Neurological: Negative.   Endo/Heme/Allergies: Negative.   Psychiatric/Behavioral: The patient is nervous/anxious.    Blood pressure (!) 124/59, pulse 92, temperature 98.3 F (36.8 C), temperature source Oral, resp. rate 18, last menstrual period 05/04/2020, SpO2 100 %. There is no height or weight on file to calculate BMI.  Musculoskeletal: Strength & Muscle Tone: within normal limits Gait & Station: normal Patient leans: N/A   [place] MSE Discharge Disposition for Follow up and Recommendations: Based on my evaluation the patient does not appear to have an emergency medical condition and can be discharged with resources and follow up care in outpatient services for Medication Management and [place], FNP 05/23/2020, 5:25 PM

## 2020-05-23 NOTE — Discharge Instructions (Signed)

## 2020-05-23 NOTE — ED Notes (Signed)
Pt received medication without difficulty. Pt stated feeling "much better". Pt denies SI/HI/AVH.

## 2020-05-23 NOTE — BH Assessment (Signed)
Comprehensive Clinical Assessment (CCA) Note  05/23/2020 [name] [id number]   Patient was dischrged from the [name] yesterday and was seen by her ACT Team ([facility])  today.  Patient states that she does not feel like her medication is working.  She states that she is very anxious and states that she is feeling depressed and suicidal.  Patient states that she overdosed a couple weeks ago and states that she was hospitalized at [facility].  She states that she has been having suicidal thoughts today and states that she has been having thoughts of overdosing again.  Patient denies HI and Psychosis.  Patient states that she has not been sleeping well, but states that her appetite has been good.  Patient denies any current drug or alcohol use.  Patient states that she lives alone.  [name] at [facility] was contacted and he indicated that he feels like patient is lonely more than anything else and seeking hospitalization for the secondary gain of socialization.   Patient presents with a flat and blunted affect. She appears to be depressed, but that is normally her baseline.  She is oriented and alert.  Her judgment, insight and impulse control are characteristically impaired.  Patient does not currently appear to be responding to any internal stimuli.  Her thoughts are organized and her memory intact.  She is moderately anxious.  Chief Complaint:  Chief Complaint  Patient presents with  . Urgent Emergent Eval  . Suicidal  . Anxiety   Visit Diagnosis: F31.30 Bipolar Disorder Depressed   CCA Screening, Triage and Referral (STR)  Patient Reported Information How did you hear about [place]? Self  Referral name: [name] 05/22/2020)  Referral phone number: No data recorded  Whom do you see for routine medical problems? Primary Care  Practice/Facility Name: [name]  Practice/Facility Phone Number: No data recorded Name of Contact: [place]  Number: [phone] [name] 04/11/2020)  Contact Fax Number: [phone] ([place] 04/11/2020)  Prescriber Name: Unknown [name] 05/22/2020)  Prescriber Address (if known): Unknown (Ph[employer]ia 05/22/2020)   What Is the Reason for Your Visit/Call Today? Suicidal/depressed  How Long Has This Been Causing You Problems? 1-6 months  What Do You Feel Would Help You the Most Today? Other (Comment) (patient requesting inpatient treatment)   Have You Recently Been in Any Inpatient Treatment (Hospital/Detox/Crisis Center/28-Day Program)? Yes  Name/Location of Program/Hospital:[facility]  How Long Were You There? 5 days  When Were You Discharged? 05/04/2020   Have You Ever Received Services From [facility] Before? Yes  Who Do You See at [facility]? [facility] inpatient treatment   Have You Recently Had Any Thoughts About Hurting Yourself? Yes  Are You Planning to Commit Suicide/Harm Yourself At This time? Yes   Have you Recently Had Thoughts About Hurting Someone [name]? No  Explanation: No data recorded  Have You Used Any Alcohol or Drugs in the Past 24 Hours? No  How Long Ago Did You Use Drugs or Alcohol? No data recorded What Did You Use and How Much? No data recorded  Do You Currently Have a Therapist/Psychiatrist? No  Name of Therapist/Psychiatrist: [facility]   Have You Been Recently Discharged From Any Office Practice or Programs? No  Explanation of Discharge From Practice/Program: No data recorded    CCA Screening Triage Referral Assessment Type of Contact: Face-to-Face  Is this Initial or Reassessment? Initial Assessment  Date Telepsych consult ordered in [facility]:  04/10/2020  Time Telepsych consult ordered in [facility]:  1728  Patient Reported Information Reviewed? Yes  Patient Left Without Being Seen? No data recorded Reason for Not Completing Assessment: No data recorded  Collateral Involvement: NP spoke to [name] who saw this patient today   Does Patient Have a  [place]? No data recorded Name and Contact of Legal Guardian: Self.   If Minor and Not Living with Parent(s), Who has Custody? Self  Is CPS involved or ever been involved? Never  Is APS involved or ever been involved? Never   Patient Determined To Be At Risk for Harm To Self or Others Based on Review of Patient Reported Information or Presenting Complaint? No  Method: No data recorded Availability of Means: No data recorded Intent: No data recorded Notification Required: No data recorded Additional Information for Danger to Others Potential: No data recorded Additional Comments for Danger to Others Potential: No data recorded Are There Guns or Other Weapons in Your Home? No data recorded Types of Guns/Weapons: No data recorded Are These Weapons Safely Secured?                            No data recorded Who Could Verify You Are Able To Have These Secured: No data recorded Do You Have any Outstanding Charges, Pending Court Dates, Parole/Probation? No data recorded Contacted To Inform of Risk of Harm To Self or Others: -- (TTS to attempt contact with [facility])   Location of Assessment: [facility] Assessment Services   Does Patient Present under Involuntary Commitment? No  IVC Papers Initial File Date: No data recorded  [place] of Residence: [place]   Patient Currently Receiving the Following Services: [facility])   Determination of Need: Emergent (2 hours)   Options For Referral: Medication Management; Other: Comment ([facility])     CCA Biopsychosocial Intake/Chief Complaint:  Patient was dischrged from the [facility] yesterday and was seen by her ACT Team ([facility])  today.  Patient states that she does not feel like her medication is working.  She states that she is very anxious and states that she is feeling depressed and suicidal.  Patient states that she overdosed a couple weeks ago and states that she was hospitalized  at [facility].  She states that she has been having suicidal thoughts today and states that she has been having thoughts of overdosing again.  Patient denies HI and Psychosis.  Patient states that she has not been sleeping well, but states that her appetite has been good.  Patient denies any current drug or alcohol use.  Patient states that she lives alone.  [name] at [facility] was contacted and he indicated that he feels like patient is lonely more than anything else and seeking hospitalization for the secondary gain of socialization.  Current Symptoms/Problems: Suicidal ideation, loneliness, despondency   Patient Reported Schizophrenia/Schizoaffective Diagnosis in Past: Yes   Strengths: Communication  Preferences: Pt wants to be placed in a group home --''I can't live independently''  Abilities: Patient was not able to identify any abilities that she has   Type of Services Patient Feels are Needed: Housing   Initial Clinical Notes/Concerns: Pt has numerous presentations to the ED, [place], and [place].  Pt endorsed suicidal ideation.  She stated to NP that she is lonely.   Mental Health Symptoms Depression:  Change in energy/activity; Hopelessness; Increase/decrease in appetite; Irritability; Sleep (too much or little); Weight gain/loss; Worthlessness; Fatigue   Duration of Depressive symptoms: Greater than  two weeks   Mania:  N/A   Anxiety:   N/A   Psychosis:  None   Duration of Psychotic symptoms: No data recorded  Trauma:  None   Obsessions:  None   Compulsions:  None   Inattention:  None   Hyperactivity/Impulsivity:  N/A   Oppositional/Defiant Behaviors:  None   Emotional Irregularity:  Recurrent suicidal behaviors/gestures/threats; Mood lability; Unstable self-image   Other Mood/Personality Symptoms:  NA    Mental Status Exam Appearance and self-care  Stature:  Average   Weight:  Overweight   Clothing:  Casual   Grooming:  Normal   Cosmetic use:   Age appropriate   Posture/gait:  Normal   Motor activity:  Not Remarkable   Sensorium  Attention:  Normal   Concentration:  Normal   Orientation:  X5   Recall/memory:  Normal   Affect and Mood  Affect:  Constricted; Depressed   Mood:  Depressed; Irritable   Relating  Eye contact:  Normal   Facial expression:  Constricted   Attitude toward examiner:  Irritable; Cooperative   Thought and Language  Speech flow: Loud; Clear and Coherent   Thought content:  Appropriate to Mood and Circumstances   Preoccupation:  None   Hallucinations:  None   Organization:  No data recorded  [employer] of Knowledge:  Average   Intelligence:  Average   Abstraction:  Normal   Judgement:  Fair   Reality Testing:  Adequate   Insight:  Fair   Decision Making:  Vacilates   Social Functioning  Social Maturity:  Isolates   Social Judgement:  Victimized   Stress  Stressors:  Housing; Relationship   Coping Ability:  Exhausted   Skill Deficits:  Decision making   Supports:  Other (Comment); Family     Religion: Religion/Spirituality Are You A Religious Person?: Yes What is Your Religious Affiliation?: Christian  Leisure/Recreation: Leisure / Recreation Do You Have Hobbies?: No  Exercise/Diet: Exercise/Diet Do You Exercise?: Yes What Type of Exercise Do You Do?: Run/Walk How Many Times a Week Do You Exercise?: 6-7 times a week Have You Gained or Lost A Significant Amount of Weight in the Past Six Months?: No Do You Follow a Special Diet?: No Do You Have Any Trouble Sleeping?: Yes Explanation of Sleeping Difficulties: insomnia, nightmares   CCA Employment/Education Employment/Work Situation: Employment / Work Situation Employment situation: On disability Why is patient on disability: Bipolar I How long has patient been on disability: UTA Patient's job has been impacted by current illness: No 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 Where was the patient employed at that time?: [employer] Has patient ever been in the [employer]?: No  Education: Education Is Patient Currently Attending School?: No Last Grade Completed:  (not assessed) Name of High School: not assessed Did Teacher, adult education From [employer]?:  (not assessed) Did Physicist, medical?:  (not assessed) Did You Attend Graduate School?:  (not assessed) Did You Have Any Special Interests In School?: not assessed Did You Have An Individualized Education Program (IIEP): No Did You Have Any Difficulty At School?: No Patient's Education Has Been Impacted by Current Illness: No   CCA Family/Childhood History Family and Relationship History: Family history Marital status: Single Are you sexually active?: Yes What is your sexual orientation?: heterosexua; Has your sexual activity been affected by drugs, alcohol, medication, or emotional stress?: Client denies.  Does patient have children?: Yes How many children?: 3 How is patient's  relationship with their children?: Three daughters, ages 31, 81, 37, who are in foster care and adoption process  Childhood History:  Childhood History By whom was/is the patient raised?: Mother Additional childhood history information: Patient reports "I only seen him (father) about three times in my life and now he don't want nothing to do with me." Description of patient's relationship with caregiver when they were a child: Patient reports "it was good, she raised me good".  How were you disciplined when you got in trouble as a child/adolescent?: Patient reports "nothing". Does patient have siblings?: Yes Number of Siblings: 2 Description of patient's current relationship with siblings: Patient reports "I have two sisters, one I get along with one of them.  The other I don't hear from that much.  Ever since she married that crazy boy." Did patient suffer any verbal/emotional/physical/sexual abuse as a child?: No Did patient suffer from  severe childhood neglect?: No Has patient ever been sexually abused/assaulted/raped as an adolescent or adult?: No Was the patient ever a victim of a crime or a disaster?: No Witnessed domestic violence?: No Has patient been affected by domestic violence as an adult?: No  Child/Adolescent Assessment:     CCA Substance Use Alcohol/Drug Use: Alcohol / Drug Use Pain Medications: Please see MAR Prescriptions: Please see MAR Over the Counter: Please see MAR History of alcohol / drug use?: No history of alcohol / drug abuse                         ASAM's:  Six Dimensions of Multidimensional Assessment  Dimension 1:  Acute Intoxication and/or Withdrawal Potential:      Dimension 2:  Biomedical Conditions and Complications:      Dimension 3:  Emotional, Behavioral, or Cognitive Conditions and Complications:     Dimension 4:  Readiness to Change:     Dimension 5:  Relapse, Continued use, or Continued Problem Potential:     Dimension 6:  Recovery/Living Environment:     ASAM Severity Score:    ASAM Recommended Level of Treatment:     Substance use Disorder (SUD)    Recommendations for Services/Supports/Treatments:    DSM5 Diagnoses: Patient Active Problem List   Diagnosis Date Noted  . Gastroesophageal reflux disease 05/04/2020  . Hyperglycemia due to type 2 diabetes mellitus ([place]) 05/04/2020  . Long term (current) use of insulin ([place]) 05/04/2020  . Migraine without aura 05/04/2020  . Morbid obesity ([place]) 05/04/2020  . 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 2 diabetes mellitus ([place]) 05/04/2020  . Prolapsed internal hemorrhoids 05/04/2020  . Vitamin D deficiency 05/04/2020  . Other symptoms and signs involving cognitive functions and awareness 05/04/2020  . Suicide attempt ([place])   . Anxiety state 03/06/2020  . Schizophrenia ([place]) 09/13/2019  . Bipolar I disorder, most recent episode depressed ([place]) 06/23/2019  . MDD (major depressive disorder) 10/10/2018  . Schizoaffective  disorder, bipolar type ([place]) 09/25/2018  . Bipolar I disorder ([place]) 06/13/2018  . HTN (hypertension) 05/03/2018  . Tobacco use disorder 05/03/2018  . Adjustment disorder with emotional disturbance 01/02/2018  . 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 Moderate bipolar I disorder, most recent episode depressed ([place])   . Psychosis ([place])   . 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 OCD (obsessive compulsive disorder) 10/05/2016  . Major depressive disorder, recurrent episode, mild ([place]) 05/04/2016  . 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 Learning disability 07/18/2015  .  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 Diabetes mellitus ([place]) 07/18/2015  . 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 Hyperlipidemia 07/18/2015  . Severe episode of recurrent major depressive disorder, without psychotic features ([place])   . Suicidal ideation   . Drug overdose   . Cognitive deficits 10/12/2012  . Generalized anxiety disorder 06/28/2012    Disposition:  Per [name], NP, patient does not meet inpatient admission criteria and can follow-up with her ACT Team   Referrals to Alternative Service(s): Referred to Alternative Service(s):   Place:   Date:   Time:    Referred to Alternative Service(s):   Place:   Date:   Time:    Referred to Alternative Service(s):   Place:   Date:   Time:    Referred to Alternative Service(s):   Place:   Date:   Time:     Carnelia Oscar J Mehtab Dolberry, LCAS

## 2020-05-23 NOTE — ED Triage Notes (Signed)
Pt bought in by [employer] with complaint stating, "Are y'all going to help me. I think my medicine (Lithium and Hydroxyzine) is making me feel suicidal and my anxiety is off the roof. I don't want to hurt myself but this medicine and the COVID shot is not mixing together good. It's messing with my head.

## 2020-05-26 ENCOUNTER — Encounter (HOSPITAL_COMMUNITY): Payer: Self-pay

## 2020-05-26 ENCOUNTER — Other Ambulatory Visit: Payer: Self-pay

## 2020-05-26 ENCOUNTER — Emergency Department (HOSPITAL_COMMUNITY)
Admission: EM | Admit: 2020-05-26 | Discharge: 2020-05-26 | Payer: Medicaid Other | Attending: Emergency Medicine | Admitting: Emergency Medicine

## 2020-05-26 ENCOUNTER — Ambulatory Visit (HOSPITAL_COMMUNITY)
Admission: EM | Admit: 2020-05-26 | Discharge: 2020-05-27 | Disposition: A | Discharge status: Home / Self Care | Payer: Medicaid Other | Source: Home / Self Care

## 2020-05-26 DIAGNOSIS — E1142 Type 2 diabetes mellitus with diabetic polyneuropathy: Secondary | ICD-10-CM | POA: Insufficient documentation

## 2020-05-26 DIAGNOSIS — Z8616 Personal history of COVID-19: Secondary | ICD-10-CM | POA: Diagnosis not present

## 2020-05-26 DIAGNOSIS — Z794 Long term (current) use of insulin: Secondary | ICD-10-CM | POA: Insufficient documentation

## 2020-05-26 DIAGNOSIS — Z20822 Contact with and (suspected) exposure to covid-19: Secondary | ICD-10-CM | POA: Insufficient documentation

## 2020-05-26 DIAGNOSIS — Z79899 Other long term (current) drug therapy: Secondary | ICD-10-CM | POA: Diagnosis not present

## 2020-05-26 DIAGNOSIS — F819 Developmental disorder of scholastic skills, unspecified: Secondary | ICD-10-CM | POA: Diagnosis present

## 2020-05-26 DIAGNOSIS — I10 Essential (primary) hypertension: Secondary | ICD-10-CM | POA: Insufficient documentation

## 2020-05-26 DIAGNOSIS — F322 Major depressive disorder, single episode, severe without psychotic features: Secondary | ICD-10-CM | POA: Insufficient documentation

## 2020-05-26 DIAGNOSIS — E1165 Type 2 diabetes mellitus with hyperglycemia: Secondary | ICD-10-CM | POA: Diagnosis not present

## 2020-05-26 DIAGNOSIS — R45851 Suicidal ideations: Secondary | ICD-10-CM | POA: Insufficient documentation

## 2020-05-26 DIAGNOSIS — F411 Generalized anxiety disorder: Secondary | ICD-10-CM | POA: Diagnosis present

## 2020-05-26 DIAGNOSIS — F3130 Bipolar disorder, current episode depressed, mild or moderate severity, unspecified: Secondary | ICD-10-CM | POA: Diagnosis not present

## 2020-05-26 DIAGNOSIS — F319 Bipolar disorder, unspecified: Secondary | ICD-10-CM

## 2020-05-26 DIAGNOSIS — F17290 Nicotine dependence, other tobacco product, uncomplicated: Secondary | ICD-10-CM | POA: Diagnosis not present

## 2020-05-26 DIAGNOSIS — F419 Anxiety disorder, unspecified: Secondary | ICD-10-CM | POA: Insufficient documentation

## 2020-05-26 DIAGNOSIS — F17200 Nicotine dependence, unspecified, uncomplicated: Secondary | ICD-10-CM | POA: Insufficient documentation

## 2020-05-26 DIAGNOSIS — F17210 Nicotine dependence, cigarettes, uncomplicated: Secondary | ICD-10-CM | POA: Diagnosis not present

## 2020-05-26 DIAGNOSIS — F332 Major depressive disorder, recurrent severe without psychotic features: Secondary | ICD-10-CM | POA: Diagnosis present

## 2020-05-26 DIAGNOSIS — R4189 Other symptoms and signs involving cognitive functions and awareness: Secondary | ICD-10-CM | POA: Diagnosis present

## 2020-05-26 LAB — COMPREHENSIVE METABOLIC PANEL
ALT: 17 U/L (ref 0–44)
AST: 25 U/L (ref 15–41)
Albumin: 3.9 g/dL (ref 3.5–5.0)
Alkaline Phosphatase: 46 U/L (ref 38–126)
Anion gap: 8 (ref 5–15)
BUN: 8 mg/dL (ref 6–20)
CO2: 25 mmol/L (ref 22–32)
Calcium: 9.1 mg/dL (ref 8.9–10.3)
Chloride: 106 mmol/L (ref 98–111)
Creatinine, Ser: 0.81 mg/dL (ref 0.44–1.00)
GFR, Estimated: >60 mL/min (ref >60)
Glucose, Bld: 218 mg/dL — ABNORMAL HIGH (ref 70–99)
Potassium: 4.2 mmol/L (ref 3.5–5.1)
Sodium: 139 mmol/L (ref 135–145)
Total Bilirubin: 0.5 mg/dL (ref 0.3–1.2)
Total Protein: 7.4 g/dL (ref 6.5–8.1)

## 2020-05-26 LAB — RAPID URINE DRUG SCREEN, HOSP PERFORMED
Amphetamines: NOT DETECTED
Barbiturates: NOT DETECTED
Benzodiazepines: NOT DETECTED
Cocaine: NOT DETECTED
Opiates: NOT DETECTED
Tetrahydrocannabinol: NOT DETECTED

## 2020-05-26 LAB — CBC WITH DIFFERENTIAL/PLATELET
Abs Immature Granulocytes: 0.02 10*3/uL (ref 0.00–0.07)
Basophils Absolute: 0.1 10*3/uL (ref 0.0–0.1)
Basophils Relative: 1 %
Eosinophils Absolute: 0.5 10*3/uL (ref 0.0–0.5)
Eosinophils Relative: 7 %
HCT: 36.9 % (ref 36.0–46.0)
Hemoglobin: 11.7 g/dL — ABNORMAL LOW (ref 12.0–15.0)
Immature Granulocytes: 0 %
Lymphocytes Relative: 35 %
Lymphs Abs: 2.5 10*3/uL (ref 0.7–4.0)
MCH: 25.3 pg — ABNORMAL LOW (ref 26.0–34.0)
MCHC: 31.7 g/dL (ref 30.0–36.0)
MCV: 79.9 fL — ABNORMAL LOW (ref 80.0–100.0)
Monocytes Absolute: 0.5 10*3/uL (ref 0.1–1.0)
Monocytes Relative: 7 %
Neutro Abs: 3.5 10*3/uL (ref 1.7–7.7)
Neutrophils Relative %: 50 %
Platelets: 270 10*3/uL (ref 150–400)
RBC: 4.62 MIL/uL (ref 3.87–5.11)
RDW: 15.9 % — ABNORMAL HIGH (ref 11.5–15.5)
WBC: 7 10*3/uL (ref 4.0–10.5)
nRBC: 0 % (ref 0.0–0.2)

## 2020-05-26 LAB — ETHANOL: Alcohol, Ethyl (B): <10 mg/dL (ref <10)

## 2020-05-26 LAB — SALICYLATE LEVEL: Salicylate Lvl: <7 mg/dL — ABNORMAL LOW (ref 7.0–30.0)

## 2020-05-26 LAB — I-STAT BETA HCG BLOOD, ED (MC, WL, AP ONLY): I-stat hCG, quantitative: <5 m[IU]/mL (ref <5)

## 2020-05-26 LAB — LITHIUM LEVEL: Lithium Lvl: 0.39 mmol/L — ABNORMAL LOW (ref 0.60–1.20)

## 2020-05-26 LAB — ACETAMINOPHEN LEVEL: Acetaminophen (Tylenol), Serum: <10 ug/mL — ABNORMAL LOW (ref 10–30)

## 2020-05-26 LAB — SARS CORONAVIRUS 2 BY RT PCR (HOSPITAL ORDER, PERFORMED IN ~~LOC~~ HOSPITAL LAB): SARS Coronavirus 2: NEGATIVE

## 2020-05-26 LAB — CBG MONITORING, ED: Glucose-Capillary: 147 mg/dL — ABNORMAL HIGH (ref 70–99)

## 2020-05-26 MED ORDER — HALOPERIDOL 5 MG PO TABS
10.0000 mg | ORAL_TABLET | Freq: Every day | ORAL | Status: DC
  Administered 2020-05-26: 10 mg via ORAL
  Filled 2020-05-26: qty 2

## 2020-05-26 MED ORDER — GABAPENTIN 300 MG PO CAPS
300.0000 mg | ORAL_CAPSULE | Freq: Three times a day (TID) | ORAL | Status: DC
  Administered 2020-05-26: 300 mg via ORAL
  Filled 2020-05-26 (×2): qty 1

## 2020-05-26 MED ORDER — VITAMIN D3 25 MCG (1000 UNIT) PO TABS: 1000.0000 [IU] | ORAL_TABLET | Freq: Every day | ORAL | Status: DC

## 2020-05-26 MED ORDER — ONDANSETRON HCL 4 MG PO TABS: 4.0000 mg | ORAL_TABLET | Freq: Three times a day (TID) | ORAL | Status: DC | PRN

## 2020-05-26 MED ORDER — PROPRANOLOL HCL 10 MG PO TABS
10.0000 mg | ORAL_TABLET | Freq: Two times a day (BID) | ORAL | Status: DC
  Administered 2020-05-26 – 2020-05-27 (×2): 10 mg via ORAL
  Filled 2020-05-26 (×2): qty 1

## 2020-05-26 MED ORDER — VITAMIN B-12 1000 MCG PO TABS: 1000.0000 ug | ORAL_TABLET | Freq: Every day | ORAL | Status: DC

## 2020-05-26 MED ORDER — LITHIUM CARBONATE ER 300 MG PO TBCR
600.0000 mg | EXTENDED_RELEASE_TABLET | Freq: Three times a day (TID) | ORAL | Status: DC
  Administered 2020-05-26: 600 mg via ORAL
  Filled 2020-05-26: qty 2

## 2020-05-26 MED ORDER — ALUM & MAG HYDROXIDE-SIMETH 200-200-20 MG/5ML PO SUSP: 30.0000 mL | Freq: Four times a day (QID) | ORAL | Status: DC | PRN

## 2020-05-26 MED ORDER — INSULIN DETEMIR 100 UNIT/ML ~~LOC~~ SOLN: 40.0000 [IU] | Freq: Two times a day (BID) | SUBCUTANEOUS | Status: DC

## 2020-05-26 MED ORDER — IBUPROFEN 200 MG PO TABS: 600.0000 mg | ORAL_TABLET | Freq: Three times a day (TID) | ORAL | Status: DC | PRN

## 2020-05-26 MED ORDER — PANTOPRAZOLE SODIUM 40 MG PO TBEC
40.0000 mg | DELAYED_RELEASE_TABLET | Freq: Every day | ORAL | Status: DC
  Administered 2020-05-26: 40 mg via ORAL
  Filled 2020-05-26: qty 1

## 2020-05-26 MED ORDER — LORAZEPAM 1 MG PO TABS
1.0000 mg | ORAL_TABLET | Freq: Once | ORAL | Status: AC
  Administered 2020-05-26: 1 mg via ORAL
  Filled 2020-05-26: qty 1

## 2020-05-26 MED ORDER — TRAZODONE HCL 50 MG PO TABS: 50.0000 mg | ORAL_TABLET | Freq: Every evening | ORAL | Status: DC | PRN

## 2020-05-26 MED ORDER — HALOPERIDOL 5 MG PO TABS: 10.0000 mg | ORAL_TABLET | Freq: Every day | ORAL | Status: DC

## 2020-05-26 MED ORDER — PROPRANOLOL HCL 20 MG PO TABS: 10.0000 mg | ORAL_TABLET | Freq: Two times a day (BID) | ORAL | Status: DC

## 2020-05-26 MED ORDER — SIMVASTATIN 20 MG PO TABS: 20.0000 mg | ORAL_TABLET | Freq: Every day | ORAL | Status: DC

## 2020-05-26 MED ORDER — HYDROXYZINE PAMOATE 25 MG PO CAPS: 25.0000 mg | ORAL_CAPSULE | Freq: Every day | ORAL | Status: DC

## 2020-05-26 MED ORDER — DIPHENHYDRAMINE HCL 25 MG PO CAPS
25.0000 mg | ORAL_CAPSULE | Freq: Every day | ORAL | Status: DC
Start: 2020-05-26 — End: 2020-05-26

## 2020-05-26 MED ORDER — BENZTROPINE MESYLATE 1 MG PO TABS
1.0000 mg | ORAL_TABLET | Freq: Two times a day (BID) | ORAL | Status: DC
Start: 2020-05-26 — End: 2020-05-26
  Administered 2020-05-26: 1 mg via ORAL
  Filled 2020-05-26: qty 1

## 2020-05-26 NOTE — ED Notes (Signed)
Pt directly admitted from [facility].Pt alert and oriented during. Pt denies HI, A/VH, and any pain. Education, support, reassurance, and encouragement provided. Pt's belongings in locker # 38. Pt denies any concerns at this time, and verbally contracts for safety. Pt ambulating on the unit with no issues. Pt remains safe on the unit.

## 2020-05-26 NOTE — ED Notes (Signed)
Pt given meal tray.

## 2020-05-26 NOTE — Discharge Instructions (Addendum)
To [place] now.

## 2020-05-26 NOTE — ED Provider Notes (Signed)
[facility] DEPT Provider Note   CSN: [id number] Arrival date & time: 05/26/20  1240     History Chief Complaint  Patient presents with  . Suicidal    Intentional Overdose    [name] is a 30 y.o. female.  Pt presents to the ED today with severe anxiety and SI.  Pt said she took 4-6 extra pills of her neurontin 300 mg tablets.  She took her normal doses of her other meds this morning.  The pt has been here frequently for si and anxiety.  She was seen at [facility] on 1/26 and on 1/27 for anxiety.  She told the nurse that she wants to go to a group home.  Pt was positive for Covid on 05/04/20.  She has no more sx of Covid.        Past Medical History:  Diagnosis Date  . Anxiety   . Bipolar 1 disorder ([place])   . Cognitive deficits   . Depression   . Diabetes mellitus without complication ([place])   . Hypertension   . Mental disorder   . Mental health disorder   . Obesity     Patient Active Problem List   Diagnosis Date Noted  . Gastroesophageal reflux disease 05/04/2020  . Hyperglycemia due to type 2 diabetes mellitus ([place]) 05/04/2020  . Long term (current) use of insulin ([place]) 05/04/2020  . Migraine without aura 05/04/2020  . Morbid obesity ([place]) 05/04/2020  . 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 2 diabetes mellitus ([place]) 05/04/2020  . Prolapsed internal hemorrhoids 05/04/2020  . Vitamin D deficiency 05/04/2020  . Other symptoms and signs involving cognitive functions and awareness 05/04/2020  . Suicide attempt ([place])   . Anxiety state 03/06/2020  . Schizophrenia ([place]) 09/13/2019  . Bipolar I disorder, most recent episode depressed ([place]) 06/23/2019  . MDD (major depressive disorder) 10/10/2018  . Schizoaffective disorder, bipolar type ([place]) 09/25/2018  . Affective psychosis, bipolar ([place]) 06/13/2018  . HTN (hypertension) 05/03/2018  . Tobacco use disorder 05/03/2018  . Adjustment disorder with emotional disturbance 01/02/2018  . 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 Moderate bipolar I disorder, most recent episode depressed ([place])   . Psychosis ([place])   . 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 OCD (obsessive compulsive disorder) 10/05/2016  . Major depressive disorder, recurrent episode, mild ([place]) 05/04/2016  . 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 Learning disability 07/18/2015  . 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 Diabetes mellitus ([place]) 07/18/2015  . 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 Hyperlipidemia 07/18/2015  . Severe episode of recurrent major depressive disorder, without psychotic features ([place])   . Suicidal ideation   . Drug overdose   . Cognitive deficits 10/12/2012  . Generalized anxiety disorder 06/28/2012    Past Surgical History:  Procedure Laterality Date  . CESAREAN SECTION    . CESAREAN SECTION N/A 04/25/2013   Procedure: REPEAT CESAREAN SECTION;  Surgeon: [name], MD;  Location: [facility];  Service: Obstetrics;  Laterality: N/A;  . MASS EXCISION N/A 06/03/2012   Procedure: EXCISION MASS;  Surgeon: [name], MD;  Location: [facility];  Service: ENT;  Laterality: N/A;  Excision uvula mass  . TONSILLECTOMY N/A 06/03/2012   Procedure: TONSILLECTOMY;  Surgeon: [name], MD;  Location: [facility];  Service: ENT;  Laterality: N/A;  . TONSILLECTOMY       OB History  Gravida  3   Para  3   Term  3   Preterm  0   AB  0   Living  3     SAB  0   IAB  0   Ectopic  0   Multiple      Live Births  3           Family History  Problem Relation Age of Onset  . Hypertension Mother   . Diabetes Father     Social History   Tobacco Use  . Smoking status: Current Every Day Smoker    Packs/day: 4.00    Years: 11.00    Pack years: 44.00    Types: Cigarettes, Cigars  . Smokeless tobacco: Never Used   . Tobacco comment: Pt declined  Vaping Use  . Vaping Use: Never used  Substance Use Topics  . Alcohol use: Not Currently    Comment: occ: last intake 4 mts ago  . Drug use: Not Currently    Types: "Crack" cocaine, Other-see comments    Comment: Patient reports hx of smoking Crack    Home Medications Prior to Admission medications   Medication Sig Start Date End Date Taking? Authorizing Provider  BANOPHEN 25 MG capsule Take 25 mg by mouth at bedtime. 04/30/20   [provider]  benztropine (COGENTIN) 1 MG tablet Take 1 mg by mouth 2 (two) times daily. 05/01/20   [provider]  cholecalciferol (VITAMIN D) 25 MCG (1000 UNIT) tablet Take 1,000 Units by mouth daily. 04/30/20   [provider]  gabapentin (NEURONTIN) 300 MG capsule Take 1 capsule (300 mg total) by mouth 3 (three) times daily. 05/23/20 06/22/20  Money, Lowry Ram, FNP  glipiZIDE (GLUCOTROL XL) 10 MG 24 hr tablet Take 10 mg by mouth once.    [provider]  haloperidol (HALDOL) 10 MG tablet Take 10 mg by mouth at bedtime. 05/09/20   [provider]  haloperidol decanoate (HALDOL DECANOATE) 50 MG/ML injection Inject 50 mg into the muscle every 28 (twenty-eight) days. 03/19/20   [provider]  insulin detemir (LEVEMIR) 100 UNIT/ML injection Inject 0.4 mLs (40 Units total) into the skin 2 (two) times daily. 05/05/20   Montine Circle, PA-C  lithium carbonate (LITHOBID) 300 MG CR tablet Take 2 tablets (600 mg total) by mouth every 12 (twelve) hours. Patient taking differently: Take 600 mg by mouth 3 (three) times daily. 05/05/20 06/04/20  Montine Circle, PA-C  metroNIDAZOLE (FLAGYL) 500 MG tablet Take 1 tablet (500 mg total) by mouth 2 (two) times daily. 05/20/20   Henderly, Britni A, PA-C  omeprazole (PRILOSEC) 20 MG capsule Take 20 mg by mouth daily. 04/30/20   [provider]  ondansetron (ZOFRAN ODT) 4 MG disintegrating tablet Take 1 tablet (4 mg total) by mouth every 8 (eight)  hours as needed for nausea or vomiting. 04/24/20   Petrucelli, Samantha R, PA-C  ondansetron (ZOFRAN ODT) 4 MG disintegrating tablet Take 1 tablet (4 mg total) by mouth every 8 (eight) hours as needed for nausea or vomiting. 05/05/20   Montine Circle, PA-C  propranolol (INDERAL) 10 MG tablet Take 1 tablet (10 mg total) by mouth 2 (two) times daily. 05/05/20 06/04/20  Montine Circle, PA-C  simvastatin (ZOCOR) 20 MG tablet Take 20 mg by mouth at bedtime. 08/21/19   [provider]  VISTARIL 25 MG capsule Take 25 mg by mouth at bedtime. 05/09/20   [provider]  vitamin B-12 (CYANOCOBALAMIN) 1000 MCG tablet  Take 1,000 mcg by mouth daily. 11/13/19   [provider]    Allergies    Wellbutrin [bupropion], Omnipaque [iohexol], Penicillins, Cogentin [benztropine], and Depakote er [divalproex sodium er]  Review of Systems   Review of Systems  Psychiatric/Behavioral: Positive for suicidal ideas. The patient is nervous/anxious.   All other systems reviewed and are negative.   Physical Exam Updated Vital Signs BP 109/75 (BP Location: Left Arm)   Pulse 96   Temp 98 F (36.7 C) (Oral)   Resp 18   Ht 5\' 7"  (1.702 m)   Wt 113.9 kg   LMP 05/04/2020   SpO2 99%   BMI 39.31 kg/m   Physical Exam Vitals and nursing note reviewed.  Constitutional:      Appearance: Normal appearance. She is obese.  HENT:     Head: Normocephalic and atraumatic.     Right Ear: External ear normal.     Left Ear: External ear normal.     Nose: Nose normal.     Mouth/Throat:     Mouth: Mucous membranes are moist.     Pharynx: Oropharynx is clear.  Eyes:     Extraocular Movements: Extraocular movements intact.     Conjunctiva/sclera: Conjunctivae normal.     Pupils: Pupils are equal, round, and reactive to light.  Cardiovascular:     Rate and Rhythm: Normal rate and regular rhythm.     Pulses: Normal pulses.     Heart sounds: Normal heart sounds.  Pulmonary:     Effort: Pulmonary effort  is normal.     Breath sounds: Normal breath sounds.  Abdominal:     General: Abdomen is flat. Bowel sounds are normal.     Palpations: Abdomen is soft.  Musculoskeletal:        General: Normal range of motion.     Cervical back: Normal range of motion and neck supple.  Skin:    General: Skin is warm.     Capillary Refill: Capillary refill takes less than 2 seconds.  Neurological:     General: No focal deficit present.     Mental Status: She is alert and oriented to person, place, and time.  Psychiatric:        Mood and Affect: Mood is anxious.        Thought Content: Thought content includes suicidal ideation.     Comments: Pt is sitting on her bed bouncing her leg and appears extremely anxious.  She keeps asking if I can help her.     ED Results / Procedures / Treatments   Labs (all labs ordered are listed, but only abnormal results are displayed) Labs Reviewed  COMPREHENSIVE METABOLIC PANEL - Abnormal; Notable for the following components:      Result Value   Glucose, Bld 218 (*)    All other components within normal limits  CBC WITH DIFFERENTIAL/PLATELET - Abnormal; Notable for the following components:   Hemoglobin 11.7 (*)    MCV 79.9 (*)    MCH 25.3 (*)    RDW 15.9 (*)    All other components within normal limits  ACETAMINOPHEN LEVEL - Abnormal; Notable for the following components:   Acetaminophen (Tylenol), Serum <10 (*)    All other components within normal limits  SALICYLATE LEVEL - Abnormal; Notable for the following components:   Salicylate Lvl <2.6 (*)    All other components within normal limits  LITHIUM LEVEL - Abnormal; Notable for the following components:   Lithium Lvl 0.39 (*)  All other components within normal limits  CBG MONITORING, ED - Abnormal; Notable for the following components:   Glucose-Capillary 147 (*)    All other components within normal limits  SARS CORONAVIRUS 2 BY RT PCR (HOSPITAL ORDER, [facility])   ETHANOL  RAPID URINE DRUG SCREEN, HOSP PERFORMED  I-STAT BETA HCG BLOOD, ED ([facility], [facility], [facility] ONLY)    EKG EKG Interpretation  Date/Time:  Sunday May 26 2020 13:32:54 EST Ventricular Rate:  68 PR Interval:    QRS Duration: 90 QT Interval:  412 QTC Calculation: 439 R Axis:   67 Text Interpretation: Sinus rhythm Inferior infarct, old No significant change since last tracing Confirmed by Dorie Rank [phone]) on 05/26/2020 1:42:56 PM   Radiology No results found.  Procedures Procedures   Medications Ordered in ED Medications  ibuprofen (ADVIL) tablet 600 mg (has no administration in time range)  ondansetron (ZOFRAN) tablet 4 mg (has no administration in time range)  alum & mag hydroxide-simeth (MAALOX/MYLANTA) 200-200-20 MG/5ML suspension 30 mL (has no administration in time range)  diphenhydrAMINE (BENADRYL) capsule 25 mg (has no administration in time range)  benztropine (COGENTIN) tablet 1 mg (1 mg Oral Given 05/26/20 1634)  cholecalciferol (VITAMIN D) tablet 1,000 Units (has no administration in time range)  haloperidol (HALDOL) tablet 10 mg (has no administration in time range)  insulin detemir (LEVEMIR) injection 40 Units (has no administration in time range)  lithium carbonate (LITHOBID) CR tablet 600 mg (600 mg Oral Given 05/26/20 1634)  pantoprazole (PROTONIX) EC tablet 40 mg (40 mg Oral Given 05/26/20 1634)  propranolol (INDERAL) tablet 10 mg (has no administration in time range)  simvastatin (ZOCOR) tablet 20 mg (has no administration in time range)  hydrOXYzine (VISTARIL) capsule 25 mg (has no administration in time range)  vitamin B-12 (CYANOCOBALAMIN) tablet 1,000 mcg (has no administration in time range)  LORazepam (ATIVAN) tablet 1 mg (1 mg Oral Given 05/26/20 1333)    ED Course  I have reviewed the triage vital signs and the nursing notes.  Pertinent labs & imaging results that were available during my care of the patient were reviewed by me and considered in my  medical decision making (see chart for details).  Clinical Course as of 05/26/20 1637  Sun May 26, 2020  1342 EKG 12-Lead [JK]    Clinical Course User Index [JK] Dorie Rank, MD   MDM Rules/Calculators/A&P                          Pt tested positive for Covid on 1/8.  She has completed her quarantine and is no longer infectious.  She has no Covid sx.  Pt's bs is 218.  She will be started on a diabetic diet with BS checks q shift.  Diabetic meds continued.  She reports SI.  She is medically cleared.  TTS consult ordered.  Pt has been accepted to [facility] for overnight obs.  She is stable for transport.   Final Clinical Impression(s) / ED Diagnoses Final diagnoses:  Suicidal ideation  Anxiety    Rx / DC Orders ED Discharge Orders    None       Isla Pence, MD 05/26/20 [id number]

## 2020-05-26 NOTE — ED Triage Notes (Signed)
Patient coming from home via EMS with c/o intension ingestion of gabapentin 300 mg tabs. Patient said she took 4-6 tablets at 1200. Patient report that she wants to go to group home.

## 2020-05-26 NOTE — ED Provider Notes (Signed)
Behavioral Health Admission H&P [facility] & OBS)  Date: 05/26/20 Patient Name: [name] MRN: [id number] Chief Complaint: Intentional overdose, suicidal ideations    Diagnoses:  Final diagnoses:  Cognitive deficits  Suicidal ideation    HPI:  [name] is a 30 year old female who presented to [facility] with severe anxiety and suicidal ideations; patient reports attempting to intentionally overdose on prescribed Gabapentin 300mg .   On assessment patient states, "I be depressed everyday and really anxious. So I just started taking extra medication because I'm real depressed and I don't have nobody". Patient states she does currently have an ACTT team that she sees weekly. Denies any other supports. Patient presents flat and concrete; complaining of increased anxiety and depression.  Patient currently endorses active suicidal ideations with plan to overdose of medications. Denies any homicidal ideations, auditory or visual hallucinations, and does not appear to be responding to any external/internal stimuli.   PHQ 2-9:  [place] ED from 05/26/2020 in [place] DEPT ED from 05/22/2020 in [facility] ED from 04/11/2020 in [facility]  Thoughts that you would be better off dead, or of hurting yourself in some way More than half the days Nearly every day Nearly every day  [Phreesia 04/11/2020]  PHQ-9 Total Score 18 19 15       Flowsheet Row ED from 05/26/2020 in [facility] Most recent reading at 05/26/2020  6:35 PM ED from 05/26/2020 in [place] DEPT Most recent reading at 05/26/2020 12:57 PM ED from 05/23/2020 in [facility] Most recent reading at 05/23/2020  3:52 PM  C-SSRS RISK CATEGORY High Risk High Risk Error: Q7 should not be populated when Q6 is No       Total Time spent with patient: 15  minutes  Musculoskeletal  Strength & Muscle Tone: within normal limits Gait & Station: normal Patient leans: N/A  Psychiatric Specialty Exam  Presentation General Appearance: Casual  Eye Contact:Fair  Speech:Clear and Coherent  Speech Volume:Normal  Handedness:Right   Mood and Affect  Mood:Dysphoric  Affect:Blunt; Congruent   Thought Process  Thought Processes:Coherent  Descriptions of Associations:Tangential  Orientation:Full (Time, Place and Person)  Thought Content:Other (comment) (concrete)  Hallucinations:Hallucinations: None  Ideas of Reference:None  Suicidal Thoughts:Suicidal Thoughts: Yes, Passive SI Active Intent and/or Plan: With Plan; With Intent  Homicidal Thoughts:Homicidal Thoughts: No   Sensorium  Memory:Immediate Fair; Recent Fair; Remote Fair  Judgment:Poor  Insight:Shallow   Executive Functions  Concentration:Fair  Attention Span:Fair  [place] of Knowledge:Poor  Language:Fair   Psychomotor Activity  Psychomotor Activity:Psychomotor Activity: Normal   Assets  Assets:Financial Resources/Insurance; Housing; Physical Health; Resilience; Social Support   Sleep  Sleep:Sleep: Fair   Physical Exam ROS  Blood pressure 118/63, pulse 84, temperature 99 F (37.2 C), temperature source Oral, resp. rate 18, last menstrual period 05/04/2020, SpO2 100 %. There is no height or weight on file to calculate BMI.  Past Psychiatric History:  Depression anxiety  Is the patient at risk to self? Yes  Has the patient been a risk to self in the past 6 months? Yes .    Has the patient been a risk to self within the distant past? Yes   Is the patient a risk to others? No   Has the patient been a risk to others in the past 6 months? No   Has the patient been a risk to others within the  distant past? No   Past Medical History:  Past Medical History:  Diagnosis Date  . Anxiety   . Bipolar 1 disorder ([place])   . Cognitive  deficits   . Depression   . Diabetes mellitus without complication ([place])   . Hypertension   . Mental disorder   . Mental health disorder   . Obesity     Past Surgical History:  Procedure Laterality Date  . CESAREAN SECTION    . CESAREAN SECTION N/A 04/25/2013   Procedure: REPEAT CESAREAN SECTION;  Surgeon: [name], MD;  Location: [facility];  Service: Obstetrics;  Laterality: N/A;  . MASS EXCISION N/A 06/03/2012   Procedure: EXCISION MASS;  Surgeon: [name], MD;  Location: [facility];  Service: ENT;  Laterality: N/A;  Excision uvula mass  . TONSILLECTOMY N/A 06/03/2012   Procedure: TONSILLECTOMY;  Surgeon: [name], MD;  Location: [place];  Service: ENT;  Laterality: N/A;  . TONSILLECTOMY      Family History:  Family History  Problem Relation Age of Onset  . Hypertension Mother   . Diabetes Father     Social History:  Social History   Socioeconomic History  . Marital status: Single    Spouse name: Not on file  . Number of children: Not on file  . Years of education: Not on file  . Highest education level: Not on file  Occupational History  . Not on file  Tobacco Use  . Smoking status: Current Every Day Smoker    Packs/day: 4.00    Years: 11.00    Pack years: 44.00    Types: Cigarettes, Cigars  . Smokeless tobacco: Never Used  . Tobacco comment: Pt declined  Vaping Use  . Vaping Use: Never used  Substance and Sexual Activity  . Alcohol use: Not Currently    Comment: occ: last intake 4 mts ago  . Drug use: Not Currently    Types: "Crack" cocaine, Other-see comments    Comment: Patient reports hx of smoking Crack  . Sexual activity: Yes    Birth control/protection: Implant  Other Topics Concern  . Not on file  Social History Narrative   ** Merged History Encounter **       Social Determinants of Health   Financial Resource Strain: Not on file  Food Insecurity: Not on file  Transportation Needs: Not on file   Physical Activity: Not on file  Stress: Not on file  Social Connections: Not on file  Intimate Partner Violence: Not on file    SDOH:  SDOH Screenings   Alcohol Screen: Low Risk   . Last Alcohol Screening Score (AUDIT): 3  Depression (PHQ2-9): Medium Risk  . PHQ-2 Score: 18  Financial Resource Strain: Not on file  Food Insecurity: Not on file  Housing: Not on file  Physical Activity: Not on file  Social Connections: Not on file  Stress: Not on file  Tobacco Use: High Risk  . Smoking Tobacco Use: Current Every Day Smoker  . Smokeless Tobacco Use: Never Used  Transportation Needs: Not on file    Last Labs:  Admission on 05/26/2020, Discharged on 05/26/2020  Component Date Value Ref Range Status  . Sodium 05/26/2020 139  135 - 145 mmol/L Final  . Potassium 05/26/2020 4.2  3.5 - 5.1 mmol/L Final  . Chloride 05/26/2020 106  98 - 111 mmol/L Final  . CO2 05/26/2020 25  22 - 32 mmol/L Final  . Glucose, Bld 05/26/2020 218* 70 -  99 mg/dL Final   Glucose reference range applies only to samples taken after fasting for at least 8 hours.  . BUN 05/26/2020 8  6 - 20 mg/dL Final  . Creatinine, Ser 05/26/2020 0.81  0.44 - 1.00 mg/dL Final  . Calcium 05/26/2020 9.1  8.9 - 10.3 mg/dL Final  . Total Protein 05/26/2020 7.4  6.5 - 8.1 g/dL Final  . Albumin 05/26/2020 3.9  3.5 - 5.0 g/dL Final  . AST 05/26/2020 25  15 - 41 U/L Final  . ALT 05/26/2020 17  0 - 44 U/L Final  . Alkaline Phosphatase 05/26/2020 46  38 - 126 U/L Final  . Total Bilirubin 05/26/2020 0.5  0.3 - 1.2 mg/dL Final  . GFR, Estimated 05/26/2020 >60  >60 mL/min Final   Comment: (NOTE) Calculated using the CKD-EPI Creatinine Equation (2021)   . Anion gap 05/26/2020 8  5 - 15 Final   Performed at [facility], [place] [address] [place], [place] [address]  . Alcohol, Ethyl (B) 05/26/2020 <10  <10 mg/dL Final   Comment: (NOTE) Lowest detectable limit for serum alcohol is 10 mg/dL.  For medical purposes  only. Performed at [facility], [place] [address], [place], [place] [address]   . Opiates 05/26/2020 NONE DETECTED  NONE DETECTED Final  . Cocaine 05/26/2020 NONE DETECTED  NONE DETECTED Final  . Benzodiazepines 05/26/2020 NONE DETECTED  NONE DETECTED Final  . Amphetamines 05/26/2020 NONE DETECTED  NONE DETECTED Final  . Tetrahydrocannabinol 05/26/2020 NONE DETECTED  NONE DETECTED Final  . Barbiturates 05/26/2020 NONE DETECTED  NONE DETECTED Final   Comment: (NOTE) DRUG SCREEN FOR MEDICAL PURPOSES ONLY.  IF CONFIRMATION IS NEEDED FOR ANY PURPOSE, NOTIFY LAB WITHIN 5 DAYS.  LOWEST DETECTABLE LIMITS FOR URINE DRUG SCREEN Drug Class                     Cutoff (ng/mL) Amphetamine and metabolites    1000 Barbiturate and metabolites    200 Benzodiazepine                 A999333 Tricyclics and metabolites     300 Opiates and metabolites        300 Cocaine and metabolites        300 THC                            50 Performed at [facility], [place] [address], [place],  [address]   . WBC 05/26/2020 7.0  4.0 - 10.5 K/uL Final  . RBC 05/26/2020 4.62  3.87 - 5.11 MIL/uL Final  . Hemoglobin 05/26/2020 11.7* 12.0 - 15.0 g/dL Final  . HCT 05/26/2020 36.9  36.0 - 46.0 % Final  . MCV 05/26/2020 79.9* 80.0 - 100.0 fL Final  . MCH 05/26/2020 25.3* 26.0 - 34.0 pg Final  . MCHC 05/26/2020 31.7  30.0 - 36.0 g/dL Final  . RDW 05/26/2020 15.9* 11.5 - 15.5 % Final  . Platelets 05/26/2020 270  150 - 400 K/uL Final   REPEATED TO VERIFY  . nRBC 05/26/2020 0.0  0.0 - 0.2 % Final  . Neutrophils Relative % 05/26/2020 50  % Final  . Neutro Abs 05/26/2020 3.5  1.7 - 7.7 K/uL Final  . Lymphocytes Relative 05/26/2020 35  % Final  . Lymphs Abs 05/26/2020 2.5  0.7 - 4.0 K/uL Final  . Monocytes Relative 05/26/2020 7  % Final  .  Monocytes Absolute 05/26/2020 0.5  0.1 - 1.0 K/uL Final  . Eosinophils Relative 05/26/2020 7  % Final  . Eosinophils Absolute 05/26/2020 0.5   0.0 - 0.5 K/uL Final  . Basophils Relative 05/26/2020 1  % Final  . Basophils Absolute 05/26/2020 0.1  0.0 - 0.1 K/uL Final  . Immature Granulocytes 05/26/2020 0  % Final  . Abs Immature Granulocytes 05/26/2020 0.02  0.00 - 0.07 K/uL Final   Performed at [facility], [place] [address], [place], [place] [address]  . I-stat hCG, quantitative 05/26/2020 <5.0  <5 mIU/mL Final  . Comment 3 05/26/2020          Final   Comment:   GEST. AGE      CONC.  (mIU/mL)   <=1 WEEK        5 - 50     2 WEEKS       50 - 500     3 WEEKS       100 - 10,000     4 WEEKS     1,000 - 30,000        FEMALE AND NON-PREGNANT FEMALE:     LESS THAN 5 mIU/mL   . Acetaminophen (Tylenol), Serum 05/26/2020 <10* 10 - 30 ug/mL Final   Comment: (NOTE) Therapeutic concentrations vary significantly. A range of 10-30 ug/mL  may be an effective concentration for many patients. However, some  are best treated at concentrations outside of this range. Acetaminophen concentrations >150 ug/mL at 4 hours after ingestion  and >50 ug/mL at 12 hours after ingestion are often associated with  toxic reactions.  Performed at [facility], [place] [address], [place], [place] [address]   . Salicylate Lvl 123456 <7.0* 7.0 - 30.0 mg/dL Final   Performed at [place] [address] [place], [place] [address]  . Lithium Lvl 05/26/2020 0.39* 0.60 - 1.20 mmol/L Final   Performed at [place] [address], [place], [name] [address]  . Glucose-Capillary 05/26/2020 147* 70 - 99 mg/dL Final   Glucose reference range applies only to samples taken after fasting for at least 8 hours.  Marland Kitchen SARS Coronavirus 2 05/26/2020 NEGATIVE  NEGATIVE Final   Comment: (NOTE) SARS-CoV-2 target nucleic acids are NOT DETECTED.  The SARS-CoV-2 RNA is generally detectable in upper and lower respiratory specimens during the acute phase of infection. The lowest concentration of  SARS-CoV-2 viral copies this assay can detect is 250 copies / mL. A negative result does not preclude SARS-CoV-2 infection and should not be used as the sole basis for treatment or other patient management decisions.  A negative result may occur with improper specimen collection / handling, submission of specimen other than nasopharyngeal swab, presence of viral mutation(s) within the areas targeted by this assay, and inadequate number of viral copies (<250 copies / mL). A negative result must be combined with clinical observations, patient history, and epidemiological information.  Fact Sheet for Patients:   StrictlyIdeas.no  Fact Sheet for Healthcare Providers: BankingDealers.co.za  This test is not yet approved or                           cleared by the [place] FDA and has been authorized for detection and/or diagnosis of SARS-CoV-2 by FDA under an Emergency Use Authorization (EUA).  This EUA will remain in effect (meaning this test can be used) for the duration of the COVID-19  declaration under Section 564(b)(1) of the Act, 21 U.S.C. section 360bbb-3(b)(1), unless the authorization is terminated or revoked sooner.  Performed at [facility], [place] [address] [place], [place] [address]   Admission on 05/20/2020, Discharged on 05/20/2020  Component Date Value Ref Range Status  . I-stat hCG, quantitative 05/20/2020 <5.0  <5 mIU/mL Final  . Comment 3 05/20/2020          Final   Comment:   GEST. AGE      CONC.  (mIU/mL)   <=1 WEEK        5 - 50     2 WEEKS       50 - 500     3 WEEKS       100 - 10,000     4 WEEKS     1,000 - 30,000        FEMALE AND NON-PREGNANT FEMALE:     LESS THAN 5 mIU/mL   . Neisseria Gonorrhea 05/20/2020 Negative   Final  . Chlamydia 05/20/2020 Negative   Final  . Comment 05/20/2020 Normal Reference Ranger Chlamydia - Negative   Final  . Comment 05/20/2020 Normal Reference Range  Neisseria Gonorrhea - Negative   Final  . RPR Ser Ql 05/20/2020 NON REACTIVE  NON REACTIVE Final   Performed at [facility], [place] [address] [place], [place] [address]  . HIV Screen 4th Generation wRfx 05/20/2020 Non Reactive  Non Reactive Final   Performed at [facility], [place] [address], [place], [place] [address]  . Yeast Wet Prep HPF POC 05/20/2020 PRESENT* NONE SEEN Final  . Trich, Wet Prep 05/20/2020 NONE SEEN  NONE SEEN Final  . Clue Cells Wet Prep HPF POC 05/20/2020 PRESENT* NONE SEEN Final  . WBC, Wet Prep HPF POC 05/20/2020 MANY* NONE SEEN Final  . Sperm 05/20/2020 NONE SEEN   Final   Performed at [facility], [place] [address] [place], [place] [address]  Admission on 04/30/2020, Discharged on 05/02/2020  Component Date Value Ref Range Status  . Lamotrigine Lvl 04/30/2020 6.0  2.0 - 20.0 ug/mL Final   Comment: (NOTE)                                Detection Limit = 1.0 Performed At: [facility] [place], [place] [id number] Rush Farmer MD [id number]   . SARS Coronavirus 2 by RT PCR 04/30/2020 NEGATIVE  NEGATIVE Final   Comment: (NOTE) SARS-CoV-2 target nucleic acids are NOT DETECTED.  The SARS-CoV-2 RNA is generally detectable in upper respiratory specimens during the acute phase of infection. The lowest concentration of SARS-CoV-2 viral copies this assay can detect is 138 copies/mL. A negative result does not preclude SARS-Cov-2 infection and should not be used as the sole basis for treatment or other patient management decisions. A negative result may occur with  improper specimen collection/handling, submission of specimen other than nasopharyngeal swab, presence of viral mutation(s) within the areas targeted by this assay, and inadequate number of viral copies(<138 copies/mL). A negative result must be combined with clinical observations, patient history, and epidemiological information. The expected result is  Negative.  Fact Sheet for Patients:  EntrepreneurPulse.com.au  Fact Sheet for Healthcare Providers:  IncredibleEmployment.be  This test is no  t yet approved or cleared by the [place] and  has been authorized for detection and/or diagnosis of SARS-CoV-2 by FDA under an Emergency Use Authorization (EUA). This EUA will remain  in effect (meaning this test can be used) for the duration of the COVID-19 declaration under Section 564(b)(1) of the Act, 21 U.S.C.section 360bbb-3(b)(1), unless the authorization is terminated  or revoked sooner.      . Influenza A by PCR 04/30/2020 NEGATIVE  NEGATIVE Final  . Influenza B by PCR 04/30/2020 NEGATIVE  NEGATIVE Final   Comment: (NOTE) The Xpert Xpress SARS-CoV-2/FLU/RSV plus assay is intended as an aid in the diagnosis of influenza from Nasopharyngeal swab specimens and should not be used as a sole basis for treatment. Nasal washings and aspirates are unacceptable for Xpert Xpress SARS-CoV-2/FLU/RSV testing.  Fact Sheet for Patients: EntrepreneurPulse.com.au  Fact Sheet for Healthcare Providers: IncredibleEmployment.be  This test is not yet approved or cleared by the [place] FDA and has been authorized for detection and/or diagnosis of SARS-CoV-2 by FDA under an Emergency Use Authorization (EUA). This EUA will remain in effect (meaning this test can be used) for the duration of the COVID-19 declaration under Section 564(b)(1) of the Act, 21 U.S.C. section 360bbb-3(b)(1), unless the authorization is terminated or revoked.  Performed at [facility], [place] [address] [place], [place] [address]   . Sodium 04/30/2020 142  135 - 145 mmol/L Final  . Potassium 04/30/2020 3.6  3.5 - 5.1 mmol/L Final  . Chloride 04/30/2020 109  98 - 111 mmol/L Final  . CO2 04/30/2020 24  22 - 32 mmol/L Final  . Glucose, Bld  04/30/2020 96  70 - 99 mg/dL Final   Glucose reference range applies only to samples taken after fasting for at least 8 hours.  . BUN 04/30/2020 8  6 - 20 mg/dL Final  . Creatinine, Ser 04/30/2020 0.66  0.44 - 1.00 mg/dL Final  . Calcium 04/30/2020 9.4  8.9 - 10.3 mg/dL Final  . Total Protein 04/30/2020 7.4  6.5 - 8.1 g/dL Final  . Albumin 04/30/2020 4.4  3.5 - 5.0 g/dL Final  . AST 04/30/2020 21  15 - 41 U/L Final  . ALT 04/30/2020 19  0 - 44 U/L Final  . Alkaline Phosphatase 04/30/2020 34* 38 - 126 U/L Final  . Total Bilirubin 04/30/2020 0.5  0.3 - 1.2 mg/dL Final  . GFR, Estimated 04/30/2020 >60  >60 mL/min Final   Comment: (NOTE) Calculated using the CKD-EPI Creatinine Equation (2021)   . Anion gap 04/30/2020 9  5 - 15 Final   Performed at [facility], [place] [address] [place], [place] [address]  . Alcohol, Ethyl (B) 04/30/2020 <10  <10 mg/dL Final   Comment: (NOTE) Lowest detectable limit for serum alcohol is 10 mg/dL.  For medical purposes only. Performed at [facility], [place] [address] [place], [place] [address]   . Opiates 04/30/2020 NONE DETECTED  NONE DETECTED Final  . Cocaine 04/30/2020 NONE DETECTED  NONE DETECTED Final  . Benzodiazepines 04/30/2020 NONE DETECTED  NONE DETECTED Final  . Amphetamines 04/30/2020 NONE DETECTED  NONE DETECTED Final  . Tetrahydrocannabinol 04/30/2020 NONE DETECTED  NONE DETECTED Final  . Barbiturates 04/30/2020 NONE DETECTED  NONE DETECTED Final   Comment: (NOTE) DRUG SCREEN FOR MEDICAL PURPOSES ONLY.  IF CONFIRMATION IS NEEDED FOR ANY PURPOSE, NOTIFY LAB WITHIN 5 DAYS.  LOWEST DETECTABLE LIMITS FOR URINE DRUG SCREEN Drug Class  Cutoff (ng/mL) Amphetamine and metabolites    1000 Barbiturate and metabolites    200 Benzodiazepine                 A999333 Tricyclics and metabolites     300 Opiates and metabolites        300 Cocaine and metabolites        300 THC                             50 Performed at [facility], [place] [address], [place], [place] [address]   . WBC 04/30/2020 11.4* 4.0 - 10.5 K/uL Final  . RBC 04/30/2020 4.50  3.87 - 5.11 MIL/uL Final  . Hemoglobin 04/30/2020 11.5* 12.0 - 15.0 g/dL Final  . HCT 04/30/2020 37.9  36.0 - 46.0 % Final  . MCV 04/30/2020 84.2  80.0 - 100.0 fL Final  . MCH 04/30/2020 25.6* 26.0 - 34.0 pg Final  . MCHC 04/30/2020 30.3  30.0 - 36.0 g/dL Final  . RDW 04/30/2020 15.7* 11.5 - 15.5 % Final  . Platelets 04/30/2020 268  150 - 400 K/uL Final  . nRBC 04/30/2020 0.0  0.0 - 0.2 % Final  . Neutrophils Relative % 04/30/2020 57  % Final  . Neutro Abs 04/30/2020 6.5  1.7 - 7.7 K/uL Final  . Lymphocytes Relative 04/30/2020 33  % Final  . Lymphs Abs 04/30/2020 3.7  0.7 - 4.0 K/uL Final  . Monocytes Relative 04/30/2020 5  % Final  . Monocytes Absolute 04/30/2020 0.6  0.1 - 1.0 K/uL Final  . Eosinophils Relative 04/30/2020 4  % Final  . Eosinophils Absolute 04/30/2020 0.4  0.0 - 0.5 K/uL Final  . Basophils Relative 04/30/2020 1  % Final  . Basophils Absolute 04/30/2020 0.1  0.0 - 0.1 K/uL Final  . Immature Granulocytes 04/30/2020 0  % Final  . Abs Immature Granulocytes 04/30/2020 0.03  0.00 - 0.07 K/uL Final   Performed at [facility], [place] [address], [place], [place] [address]  . I-stat hCG, quantitative 04/30/2020 <5.0  <5 mIU/mL Final  . Comment 3 04/30/2020          Final   Comment:   GEST. AGE      CONC.  (mIU/mL)   <=1 WEEK        5 - 50     2 WEEKS       50 - 500     3 WEEKS       100 - 10,000     4 WEEKS     1,000 - 30,000        FEMALE AND NON-PREGNANT FEMALE:     LESS THAN 5 mIU/mL   . Acetaminophen (Tylenol), Serum 04/30/2020 <10* 10 - 30 ug/mL Final   Comment: (NOTE) Therapeutic concentrations vary significantly. A range of 10-30 ug/mL  may be an effective concentration for many patients. However, some  are best treated at concentrations outside of this range. Acetaminophen  concentrations >150 ug/mL at 4 hours after ingestion  and >50 ug/mL at 12 hours after ingestion are often associated with  toxic reactions.  Performed at [facility], [place] [address] [place], [place] [address]   . Salicylate Lvl 123XX123 <7.0* 7.0 - 30.0 mg/dL Final   Performed at [place] [address] [place], [place] [address]  Admission on 04/24/2020, Discharged on 04/24/2020  Component Date Value Ref  Range Status  . SARS Coronavirus 2 by RT PCR 04/24/2020 NEGATIVE  NEGATIVE Final   Comment: (NOTE) SARS-CoV-2 target nucleic acids are NOT DETECTED.  The SARS-CoV-2 RNA is generally detectable in upper respiratory specimens during the acute phase of infection. The lowest concentration of SARS-CoV-2 viral copies this assay can detect is 138 copies/mL. A negative result does not preclude SARS-Cov-2 infection and should not be used as the sole basis for treatment or other patient management decisions. A negative result may occur with  improper specimen collection/handling, submission of specimen other than nasopharyngeal swab, presence of viral mutation(s) within the areas targeted by this assay, and inadequate number of viral copies(<138 copies/mL). A negative result must be combined with clinical observations, patient history, and epidemiological information. The expected result is Negative.  Fact Sheet for Patients:  EntrepreneurPulse.com.au  Fact Sheet for Healthcare Providers:  IncredibleEmployment.be  This test is no                          t yet approved or cleared by the [place] FDA and  has been authorized for detection and/or diagnosis of SARS-CoV-2 by FDA under an Emergency Use Authorization (EUA). This EUA will remain  in effect (meaning this test can be used) for the duration of the COVID-19 declaration under Section 564(b)(1) of the Act, 21 U.S.C.section 360bbb-3(b)(1), unless  the authorization is terminated  or revoked sooner.      . Influenza A by PCR 04/24/2020 NEGATIVE  NEGATIVE Final  . Influenza B by PCR 04/24/2020 NEGATIVE  NEGATIVE Final   Comment: (NOTE) The Xpert Xpress SARS-CoV-2/FLU/RSV plus assay is intended as an aid in the diagnosis of influenza from Nasopharyngeal swab specimens and should not be used as a sole basis for treatment. Nasal washings and aspirates are unacceptable for Xpert Xpress SARS-CoV-2/FLU/RSV testing.  Fact Sheet for Patients: EntrepreneurPulse.com.au  Fact Sheet for Healthcare Providers: IncredibleEmployment.be  This test is not yet approved or cleared by the [place] FDA and has been authorized for detection and/or diagnosis of SARS-CoV-2 by FDA under an Emergency Use Authorization (EUA). This EUA will remain in effect (meaning this test can be used) for the duration of the COVID-19 declaration under Section 564(b)(1) of the Act, 21 U.S.C. section 360bbb-3(b)(1), unless the authorization is terminated or revoked.  Performed at [facility], [place] [address] [place], [place] [address]   . Color, Urine 04/24/2020 YELLOW  YELLOW Final  . APPearance 04/24/2020 HAZY* CLEAR Final  . Specific Gravity, Urine 04/24/2020 1.027  1.005 - 1.030 Final  . pH 04/24/2020 5.0  5.0 - 8.0 Final  . Glucose, UA 04/24/2020 NEGATIVE  NEGATIVE mg/dL Final  . Hgb urine dipstick 04/24/2020 NEGATIVE  NEGATIVE Final  . Bilirubin Urine 04/24/2020 NEGATIVE  NEGATIVE Final  . Ketones, ur 04/24/2020 5* NEGATIVE mg/dL Final  . Protein, ur 04/24/2020 NEGATIVE  NEGATIVE mg/dL Final  . Nitrite 04/24/2020 NEGATIVE  NEGATIVE Final  . Chalmers Guest 04/24/2020 NEGATIVE  NEGATIVE Final   Performed at [facility], [place] [address] [place], [place] [address]  . Preg Test, Ur 04/24/2020 NEGATIVE  NEGATIVE Final   Comment:        THE SENSITIVITY OF THIS METHODOLOGY IS >20  mIU/mL. Performed at [facility], [place] [address], [place], [place] [address]   Admission on 04/11/2020, Discharged on 04/11/2020  Component Date Value Ref Range Status  . Glucose-Capillary 04/11/2020 116* 70 - 99 mg/dL Final  Glucose reference range applies only to samples taken after fasting for at least 8 hours.  . Glucose-Capillary 04/11/2020 133* 70 - 99 mg/dL Final   Glucose reference range applies only to samples taken after fasting for at least 8 hours.  Admission on 04/10/2020, Discharged on 04/11/2020  Component Date Value Ref Range Status  . Sodium 04/10/2020 141  135 - 145 mmol/L Final  . Potassium 04/10/2020 3.6  3.5 - 5.1 mmol/L Final  . Chloride 04/10/2020 107  98 - 111 mmol/L Final  . CO2 04/10/2020 25  22 - 32 mmol/L Final  . Glucose, Bld 04/10/2020 90  70 - 99 mg/dL Final   Glucose reference range applies only to samples taken after fasting for at least 8 hours.  . BUN 04/10/2020 8  6 - 20 mg/dL Final  . Creatinine, Ser 04/10/2020 0.87  0.44 - 1.00 mg/dL Final  . Calcium 04/10/2020 9.0  8.9 - 10.3 mg/dL Final  . Total Protein 04/10/2020 7.1  6.5 - 8.1 g/dL Final  . Albumin 04/10/2020 4.0  3.5 - 5.0 g/dL Final  . AST 04/10/2020 22  15 - 41 U/L Final  . ALT 04/10/2020 19  0 - 44 U/L Final  . Alkaline Phosphatase 04/10/2020 36* 38 - 126 U/L Final  . Total Bilirubin 04/10/2020 0.7  0.3 - 1.2 mg/dL Final  . GFR, Estimated 04/10/2020 >60  >60 mL/min Final   Comment: (NOTE) Calculated using the CKD-EPI Creatinine Equation (2021)   . Anion gap 04/10/2020 9  5 - 15 Final   Performed at [facility], [place] [address], [place], [place] [address]  . Alcohol, Ethyl (B) 04/10/2020 <10  <10 mg/dL Final   Comment: (NOTE) Lowest detectable limit for serum alcohol is 10 mg/dL.  For medical purposes only. Performed at [facility], [place] [address] [place], [place] [address]   . Salicylate Lvl [id number] <7.0* 7.0 - 30.0  mg/dL Final   Performed at [place] [address], [place], [place] [address]  . Acetaminophen (Tylenol), Serum 04/10/2020 <10* 10 - 30 ug/mL Final   Comment: (NOTE) Therapeutic concentrations vary significantly. A range of 10-30 ug/mL  may be an effective concentration for many patients. However, some  are best treated at concentrations outside of this range. Acetaminophen concentrations >150 ug/mL at 4 hours after ingestion  and >50 ug/mL at 12 hours after ingestion are often associated with  toxic reactions.  Performed at [facility], [place] [address] [place], [place] [address]   . WBC 04/10/2020 10.1  4.0 - 10.5 K/uL Final  . RBC 04/10/2020 4.24  3.87 - 5.11 MIL/uL Final  . Hemoglobin 04/10/2020 11.0* 12.0 - 15.0 g/dL Final  . HCT 04/10/2020 35.2* 36.0 - 46.0 % Final  . MCV 04/10/2020 83.0  80.0 - 100.0 fL Final  . MCH 04/10/2020 25.9* 26.0 - 34.0 pg Final  . MCHC 04/10/2020 31.3  30.0 - 36.0 g/dL Final  . RDW 04/10/2020 15.2  11.5 - 15.5 % Final  . Platelets 04/10/2020 262  150 - 400 K/uL Final  . nRBC 04/10/2020 0.0  0.0 - 0.2 % Final   Performed at [facility], [place] [address], [place], [place] [address]  . Opiates 04/10/2020 NONE DETECTED  NONE DETECTED Final  . Cocaine 04/10/2020 NONE DETECTED  NONE DETECTED Final  . Benzodiazepines 04/10/2020 NONE DETECTED  NONE DETECTED Final  . Amphetamines 04/10/2020 NONE DETECTED  NONE DETECTED Final  . Tetrahydrocannabinol  04/10/2020 NONE DETECTED  NONE DETECTED Final  . Barbiturates 04/10/2020 NONE DETECTED  NONE DETECTED Final   Comment: (NOTE) DRUG SCREEN FOR MEDICAL PURPOSES ONLY.  IF CONFIRMATION IS NEEDED FOR ANY PURPOSE, NOTIFY LAB WITHIN 5 DAYS.  LOWEST DETECTABLE LIMITS FOR URINE DRUG SCREEN Drug Class                     Cutoff (ng/mL) Amphetamine and metabolites    1000 Barbiturate and metabolites    200 Benzodiazepine                 086 Tricyclics and  metabolites     300 Opiates and metabolites        300 Cocaine and metabolites        300 THC                            50 Performed at [facility], [place] [address], [place], [place] [address]   . I-stat hCG, quantitative 04/10/2020 <5.0  <5 mIU/mL Final  . Comment 3 04/10/2020          Final   Comment:   GEST. AGE      CONC.  (mIU/mL)   <=1 WEEK        5 - 50     2 WEEKS       50 - 500     3 WEEKS       100 - 10,000     4 WEEKS     1,000 - 30,000        FEMALE AND NON-PREGNANT FEMALE:     LESS THAN 5 mIU/mL   . SARS Coronavirus 2 by RT PCR 04/10/2020 NEGATIVE  NEGATIVE Final   Comment: (NOTE) SARS-CoV-2 target nucleic acids are NOT DETECTED.  The SARS-CoV-2 RNA is generally detectable in upper respiratory specimens during the acute phase of infection. The lowest concentration of SARS-CoV-2 viral copies this assay can detect is 138 copies/mL. A negative result does not preclude SARS-Cov-2 infection and should not be used as the sole basis for treatment or other patient management decisions. A negative result may occur with  improper specimen collection/handling, submission of specimen other than nasopharyngeal swab, presence of viral mutation(s) within the areas targeted by this assay, and inadequate number of viral copies(<138 copies/mL). A negative result must be combined with clinical observations, patient history, and epidemiological information. The expected result is Negative.  Fact Sheet for Patients:  EntrepreneurPulse.com.au  Fact Sheet for Healthcare Providers:  IncredibleEmployment.be  This test is no                          t yet approved or cleared by the [place] FDA and  has been authorized for detection and/or diagnosis of SARS-CoV-2 by FDA under an Emergency Use Authorization (EUA). This EUA will remain  in effect (meaning this test can be used) for the duration of the COVID-19 declaration under  Section 564(b)(1) of the Act, 21 U.S.C.section 360bbb-3(b)(1), unless the authorization is terminated  or revoked sooner.      . Influenza A by PCR 04/10/2020 NEGATIVE  NEGATIVE Final  . Influenza B by PCR 04/10/2020 NEGATIVE  NEGATIVE Final   Comment: (NOTE) The Xpert Xpress SARS-CoV-2/FLU/RSV plus assay is intended as an aid in the diagnosis of influenza from Nasopharyngeal swab specimens and should not be used as a sole  basis for treatment. Nasal washings and aspirates are unacceptable for Xpert Xpress SARS-CoV-2/FLU/RSV testing.  Fact Sheet for Patients: EntrepreneurPulse.com.au  Fact Sheet for Healthcare Providers: IncredibleEmployment.be  This test is not yet approved or cleared by the [place] FDA and has been authorized for detection and/or diagnosis of SARS-CoV-2 by FDA under an Emergency Use Authorization (EUA). This EUA will remain in effect (meaning this test can be used) for the duration of the COVID-19 declaration under Section 564(b)(1) of the Act, 21 U.S.C. section 360bbb-3(b)(1), unless the authorization is terminated or revoked.  Performed at [facility], [place] [address] [place], [place] [address]   . Lithium Lvl 04/10/2020 0.36* 0.60 - 1.20 mmol/L Final   Performed at [place] [address], [place], [place] [address]  . Glucose-Capillary 04/10/2020 108* 70 - 99 mg/dL Final   Glucose reference range applies only to samples taken after fasting for at least 8 hours.  Admission on 04/06/2020, Discharged on 04/06/2020  Component Date Value Ref Range Status  . I-stat hCG, quantitative 04/06/2020 <5.0  <5 mIU/mL Final  . Comment 3 04/06/2020          Final   Comment:   GEST. AGE      CONC.  (mIU/mL)   <=1 WEEK        5 - 50     2 WEEKS       50 - 500     3 WEEKS       100 - 10,000     4 WEEKS     1,000 - 30,000        FEMALE AND NON-PREGNANT FEMALE:     LESS THAN 5 mIU/mL    Admission on 04/03/2020, Discharged on 04/03/2020  Component Date Value Ref Range Status  . Sodium 04/03/2020 138  135 - 145 mmol/L Final  . Potassium 04/03/2020 3.8  3.5 - 5.1 mmol/L Final  . Chloride 04/03/2020 105  98 - 111 mmol/L Final  . CO2 04/03/2020 24  22 - 32 mmol/L Final  . Glucose, Bld 04/03/2020 110* 70 - 99 mg/dL Final   Glucose reference range applies only to samples taken after fasting for at least 8 hours.  . BUN 04/03/2020 8  6 - 20 mg/dL Final  . Creatinine, Ser 04/03/2020 0.84  0.44 - 1.00 mg/dL Final  . Calcium 04/03/2020 9.4  8.9 - 10.3 mg/dL Final  . GFR, Estimated 04/03/2020 >60  >60 mL/min Final   Comment: (NOTE) Calculated using the CKD-EPI Creatinine Equation (2021)   . Anion gap 04/03/2020 9  5 - 15 Final   Performed at [facility], [place] [address] [place], [place] [address]  . WBC 04/03/2020 13.9* 4.0 - 10.5 K/uL Final  . RBC 04/03/2020 4.55  3.87 - 5.11 MIL/uL Final  . Hemoglobin 04/03/2020 11.5* 12.0 - 15.0 g/dL Final  . HCT 04/03/2020 37.2  36.0 - 46.0 % Final  . MCV 04/03/2020 81.8  80.0 - 100.0 fL Final  . MCH 04/03/2020 25.3* 26.0 - 34.0 pg Final  . MCHC 04/03/2020 30.9  30.0 - 36.0 g/dL Final  . RDW 04/03/2020 14.4  11.5 - 15.5 % Final  . Platelets 04/03/2020 321  150 - 400 K/uL Final  . nRBC 04/03/2020 0.0  0.0 - 0.2 % Final   Performed at [facility] [address] [place], [place] [address]  . Troponin I (High Sensitivity) 04/03/2020 3  <18 ng/L Final  Comment: (NOTE) Elevated high sensitivity troponin I (hsTnI) values and significant  changes across serial measurements may suggest ACS but many other  chronic and acute conditions are known to elevate hsTnI results.  Refer to the "Links" section for chest pain algorithms and additional  guidance. Performed at [facility], [place] [address] [place], [place] [address]   . I-stat hCG, quantitative 04/03/2020 <5.0  <5 mIU/mL Final  . Comment 3 04/03/2020          Final    Comment:   GEST. AGE      CONC.  (mIU/mL)   <=1 WEEK        5 - 50     2 WEEKS       50 - 500     3 WEEKS       100 - 10,000     4 WEEKS     1,000 - 30,000        FEMALE AND NON-PREGNANT FEMALE:     LESS THAN 5 mIU/mL   . Troponin I (High Sensitivity) 04/03/2020 <2  <18 ng/L Final   Comment: (NOTE) Elevated high sensitivity troponin I (hsTnI) values and significant  changes across serial measurements may suggest ACS but many other  chronic and acute conditions are known to elevate hsTnI results.  Refer to the "Links" section for chest pain algorithms and additional  guidance. Performed at [facility], [place] [address] [place], [place] [address]   Admission on 03/08/2020, Discharged on 03/08/2020  Component Date Value Ref Range Status  . SARS Coronavirus 2 Ag 03/08/2020 Negative  Negative Preliminary  . SARS Coronavirus 2 by RT PCR 03/08/2020 NEGATIVE  NEGATIVE Final   Comment: (NOTE) SARS-CoV-2 target nucleic acids are NOT DETECTED.  The SARS-CoV-2 RNA is generally detectable in upper respiratoy specimens during the acute phase of infection. The lowest concentration of SARS-CoV-2 viral copies this assay can detect is 131 copies/mL. A negative result does not preclude SARS-Cov-2 infection and should not be used as the sole basis for treatment or other patient management decisions. A negative result may occur with  improper specimen collection/handling, submission of specimen other than nasopharyngeal swab, presence of viral mutation(s) within the areas targeted by this assay, and inadequate number of viral copies (<131 copies/mL). A negative result must be combined with clinical observations, patient history, and epidemiological information. The expected result is Negative.  Fact Sheet for Patients:  PinkCheek.be  Fact Sheet for Healthcare Providers:  GravelBags.it  This test is no                          t  yet approved or cleared by the [place] FDA and  has been authorized for detection and/or diagnosis of SARS-CoV-2 by FDA under an Emergency Use Authorization (EUA). This EUA will remain  in effect (meaning this test can be used) for the duration of the COVID-19 declaration under Section 564(b)(1) of the Act, 21 U.S.C. section 360bbb-3(b)(1), unless the authorization is terminated or revoked sooner.    . Influenza A by PCR 03/08/2020 NEGATIVE  NEGATIVE Final  . Influenza B by PCR 03/08/2020 NEGATIVE  NEGATIVE Final   Comment: (NOTE) The Xpert Xpress SARS-CoV-2/FLU/RSV assay is intended as an aid in  the diagnosis of influenza from Nasopharyngeal swab specimens and  should not be used as a sole basis for treatment. Nasal washings and  aspirates are unacceptable for Xpert Xpress SARS-CoV-2/FLU/RSV  testing.  Fact Sheet for Patients: PinkCheek.be  Fact Sheet for Healthcare Providers: GravelBags.it  This test is not yet approved or cleared by the [place] FDA and  has been authorized for detection and/or diagnosis of SARS-CoV-2 by  FDA under an Emergency Use Authorization (EUA). This EUA will remain  in effect (meaning this test can be used) for the duration of the  Covid-19 declaration under Section 564(b)(1) of the Act, 21  U.S.C. section 360bbb-3(b)(1), unless the authorization is  terminated or revoked. Performed at [facility], [place] [address], [place], [place] [address]   . SARS Coronavirus 2 Ag 03/08/2020 NEGATIVE  NEGATIVE Final   Comment: (NOTE) SARS-CoV-2 antigen NOT DETECTED.   Negative results are presumptive.  Negative results do not preclude SARS-CoV-2 infection and should not be used as the sole basis for treatment or other patient management decisions, including infection  control decisions, particularly in the presence of clinical signs and  symptoms consistent with COVID-19, or in those who  have been in contact with the virus.  Negative results must be combined with clinical observations, patient history, and epidemiological information. The expected result is Negative.  Fact Sheet for Patients: PodPark.tn  Fact Sheet for Healthcare Providers: GiftContent.is   This test is not yet approved or cleared by the [place] FDA and  has been authorized for detection and/or diagnosis of SARS-CoV-2 by FDA under an Emergency Use Authorization (EUA).  This EUA will remain in effect (meaning this test can be used) for the duration of  the C                          OVID-19 declaration under Section 564(b)(1) of the Act, 21 U.S.C. section 360bbb-3(b)(1), unless the authorization is terminated or revoked sooner.    Admission on 03/06/2020, Discharged on 03/06/2020  Component Date Value Ref Range Status  . Sodium 03/06/2020 139  135 - 145 mmol/L Final  . Potassium 03/06/2020 3.4* 3.5 - 5.1 mmol/L Final  . Chloride 03/06/2020 104  98 - 111 mmol/L Final  . CO2 03/06/2020 23  22 - 32 mmol/L Final  . Glucose, Bld 03/06/2020 110* 70 - 99 mg/dL Final   Glucose reference range applies only to samples taken after fasting for at least 8 hours.  . BUN 03/06/2020 9  6 - 20 mg/dL Final  . Creatinine, Ser 03/06/2020 0.83  0.44 - 1.00 mg/dL Final  . Calcium 03/06/2020 9.4  8.9 - 10.3 mg/dL Final  . Total Protein 03/06/2020 7.2  6.5 - 8.1 g/dL Final  . Albumin 03/06/2020 4.2  3.5 - 5.0 g/dL Final  . AST 03/06/2020 25  15 - 41 U/L Final  . ALT 03/06/2020 24  0 - 44 U/L Final  . Alkaline Phosphatase 03/06/2020 39  38 - 126 U/L Final  . Total Bilirubin 03/06/2020 0.6  0.3 - 1.2 mg/dL Final  . GFR, Estimated 03/06/2020 >60  >60 mL/min Final   Comment: (NOTE) Calculated using the CKD-EPI Creatinine Equation (2021)   . Anion gap 03/06/2020 12  5 - 15 Final   Performed at [facility], [place] [address], [place], [place] [address]   . Alcohol, Ethyl (B) 03/06/2020 <10  <10 mg/dL Final   Comment: (NOTE) Lowest detectable limit for serum alcohol is 10 mg/dL.  For medical purposes only. Performed at [facility], [place] [address] [place], [place] [address]   . Salicylate Lvl [id number] <7.0* 7.0 -  30.0 mg/dL Final   Performed at [facility], [place] [address], [place], [place] [address]  . Acetaminophen (Tylenol), Serum 03/06/2020 <10* 10 - 30 ug/mL Final   Comment: (NOTE) Therapeutic concentrations vary significantly. A range of 10-30 ug/mL  may be an effective concentration for many patients. However, some  are best treated at concentrations outside of this range. Acetaminophen concentrations >150 ug/mL at 4 hours after ingestion  and >50 ug/mL at 12 hours after ingestion are often associated with  toxic reactions.  Performed at [facility], [place] [address] [place], [place] [address]   . WBC 03/06/2020 14.5* 4.0 - 10.5 K/uL Final  . RBC 03/06/2020 4.81  3.87 - 5.11 MIL/uL Final  . Hemoglobin 03/06/2020 12.4  12.0 - 15.0 g/dL Final  . HCT 03/06/2020 39.8  36.0 - 46.0 % Final  . MCV 03/06/2020 82.7  80.0 - 100.0 fL Final  . MCH 03/06/2020 25.8* 26.0 - 34.0 pg Final  . MCHC 03/06/2020 31.2  30.0 - 36.0 g/dL Final  . RDW 03/06/2020 13.9  11.5 - 15.5 % Final  . Platelets 03/06/2020 255  150 - 400 K/uL Final   REPEATED TO VERIFY  . nRBC 03/06/2020 0.0  0.0 - 0.2 % Final   Performed at [facility], [place] [address] [place], [place] [address]  There may be more visits with results that are not included.    Allergies: Wellbutrin [bupropion], Omnipaque [iohexol], Penicillins, Cogentin [benztropine], and Depakote er [divalproex sodium er]  PTA Medications: (Not in a hospital admission)   Medical Decision Making  Will restart home medications where appropriate.     Recommendations  Based on my evaluation the patient does not appear to have an emergency medical condition. Patient  admitted for overnight to [facility] for further observation for further observation and stabilization.   Inda Merlin, NP 05/26/20  7:21 PM

## 2020-05-26 NOTE — BH Assessment (Signed)
Comprehensive Clinical Assessment (CCA) Note  05/26/2020 [name] [id number]   t:  This is patient's third attempt to gain admission to Psychiatry this week.  Patient was just discharged from the inpatient unit on 05/04/20 and is followed by the [facility] and was lst seen three days ago.  Today, patient states that she intentionally overdosed on 5-6 Gabapentin pills in what she describes as a suicide attempt, however, the dosage she took was not one that was lethal.  Patient states that she does not feel like her medications are working and states, "I need some help."  Patient states, if I am not admitted to the hospital this time, I swear to you I will go home and take more pills.  Patient's main issue is that she is very lonely living independently.  She states that she would like to go to a group home and she has asked her ACT Team to get her into one, but she states, "they are dragging their feet."  Patient denies current HI and states that she is not experiencing AVH.  Patient denies any drug or alcohol use. Patient states that her sleep and appetite have not been that good lately.  Patient states, "I need to be in the hospital for several days so that they can monitor my medications in order to make sure they are the right ones."  Patient's affect is the brightest it has been in a long time.  She is alert and oriented.  She does not appear to be responding to any internal stimuli.  Patient is chronically depressed and has chronic suicidal ideation.  She also is skilled and know what she needs to say to gain admission to the hospital for the secondary gain of socialization.  Patient 's judgment, insight and impulse control are impaired.  Her thoughts are organized and her memory is intact.  Chief Complaint:  Chief Complaint  Patient presents with  . Suicidal    Intentional Overdose   Visit Diagnosis: F31.30 Bipolar Disorder Depressed   CCA Screening, Triage and  Referral (STR)  Patient Reported Information How did you hear about [place]? Other (Comment) (Patient brought in by EMS for an intentional overdose)  Referral name: self  Referral phone number: No data recorded  Whom do you see for routine medical problems? Primary Care  Practice/Facility Name: [facility]-Dr Jake Samples  Practice/Facility Phone Number: [phone]  Name of Contact: [place]  Contact Number: [phone] [name] 04/11/2020)  Contact Fax Number: [phone] ([facility] 04/11/2020)  Prescriber Name: Unknown [name] 05/22/2020)  Prescriber Address (if known): Unknown ([employer] 05/22/2020)   What Is the Reason for Your Visit/Call Today? suicidal, overdosed on Neurontin in a suicide attempt.  Patient took more pills than prescribed, but not a lethal amount  How Long Has This Been Causing You Problems? 1-6 months  What Do You Feel Would Help You the Most Today? -- (patient is requesting hospitalization)   Have You Recently Been in Any Inpatient Treatment (Hospital/Detox/Crisis Center/28-Day Program)? Yes  Name/Location of Program/Hospital:[facility]  How Long Were You There? 5 days  When Were You Discharged? 05/04/2020   Have You Ever Received Services From [employer] Before? Yes  Who Do You See at [facility]? [facility] inpatient treatment   Have You Recently Had Any Thoughts About Hurting Yourself? Yes  Are You Planning to Commit Suicide/Harm Yourself At This time? Yes   Have you Recently Had Thoughts About Hurting Someone [name]? No  Explanation: No data recorded  Have  You Used Any Alcohol or Drugs in the Past 24 Hours? No  How Long Ago Did You Use Drugs or Alcohol? No data recorded What Did You Use and How Much? No data recorded  Do You Currently Have a Therapist/Psychiatrist? No  Name of Therapist/Psychiatrist: [facility]   Have You Been Recently Discharged From Any Office Practice or Programs? No  Explanation of Discharge From  Practice/Program: No data recorded    CCA Screening Triage Referral Assessment Type of Contact: Tele-Assessment  Is this Initial or Reassessment? Initial Assessment  Date Telepsych consult ordered in [facility]:  05/26/2020  Time Telepsych consult ordered in [facility]:  1430   Patient Reported Information Reviewed? Yes  Patient Left Without Being Seen? No data recorded Reason for Not Completing Assessment: No data recorded  Collateral Involvement: none available   Does Patient Have a Court Appointed Legal Guardian? No data recorded Name and Contact of Legal Guardian: Self.   If Minor and Not Living with Parent(s), Who has Custody? Self  Is CPS involved or ever been involved? Never  Is APS involved or ever been involved? Never   Patient Determined To Be At Risk for Harm To Self or Others Based on Review of Patient Reported Information or Presenting Complaint? Yes, for Self-Harm  Method: No data recorded Availability of Means: No data recorded Intent: No data recorded Notification Required: No data recorded Additional Information for Danger to Others Potential: No data recorded Additional Comments for Danger to Others Potential: No data recorded Are There Guns or Other Weapons in Your Home? No data recorded Types of Guns/Weapons: No data recorded Are These Weapons Safely Secured?                            No data recorded Who Could Verify You Are Able To Have These Secured: No data recorded Do You Have any Outstanding Charges, Pending Court Dates, Parole/Probation? No data recorded Contacted To Inform of Risk of Harm To Self or Others: Unable to Contact: (TTS or Social work will notify [facility] of patient's ED admission)   Location of Assessment: [facility] ED   Does Patient Present under Involuntary Commitment? No  IVC Papers Initial File Date: No data recorded  [place] of Residence: [place]   Patient Currently Receiving the Following Services: ACTT Psychiatrist)   Determination of Need: Emergent (2 hours)   Options For Referral: Other: Comment ([facility]uous [facility] at the [facility])     CCA Biopsychosocial Intake/Chief Complaint:  This is patient's third attempt to gain admission to Psychiatry this week.  Patient was just discharged from the inpatient unit on 05/04/20 and is followed by the [facility] and was lst seen three days ago.  Today, patient states that she intentionally overdosed on 5-6 Gabapentin pills in what she describes as a suicide attempt, however, the dosage she took was not one that was lethal.  Patient states that she does not feel like her medications are working and states, "I need some help."  Patient states, if I am not admitted to the hospital this time, I swear to you I will go home and take more pills.  Patient's main issue is that she is very lonely living independently.  She states that she would like to go to a group home and she has asked her ACT Team to get her into one, but she states, "they are dragging their feet."  Patient denies current HI and  states that she is not experiencing AVH.  Patient denies any drug or alcohol use. Patient states that her sleep and appetite have not been that good lately.  Patient states, "I need to be in the hospital for several days so that they can monitor my medications in order to make sure they are the right ones."  Current Symptoms/Problems: Suicidal ideation, loneliness, despondency   Patient Reported Schizophrenia/Schizoaffective Diagnosis in Past: Yes   Strengths: Communication  Preferences: Pt wants to be placed in a group home --''I can't live independently''  Abilities: Patient was not able to identify any abilities that she has   Type of Services Patient Feels are Needed: Housing   Initial Clinical Notes/Concerns: Pt has numerous presentations to the ED, [place], and [place].  Pt endorsed suicidal ideation.  She stated to NP that she is  lonely.   Mental Health Symptoms Depression:  Change in energy/activity; Hopelessness; Increase/decrease in appetite; Irritability; Sleep (too much or little); Weight gain/loss; Worthlessness; Fatigue   Duration of Depressive symptoms: Greater than two weeks   Mania:  N/A   Anxiety:   N/A   Psychosis:  None   Duration of Psychotic symptoms: No data recorded  Trauma:  None   Obsessions:  None   Compulsions:  None   Inattention:  None   Hyperactivity/Impulsivity:  N/A   Oppositional/Defiant Behaviors:  None   Emotional Irregularity:  Recurrent suicidal behaviors/gestures/threats; Mood lability; Unstable self-image   Other Mood/Personality Symptoms:  NA    Mental Status Exam Appearance and self-care  Stature:  Average   Weight:  Overweight   Clothing:  Casual   Grooming:  Normal   Cosmetic use:  Age appropriate   Posture/gait:  Normal   Motor activity:  Not Remarkable   Sensorium  Attention:  Normal   Concentration:  Normal   Orientation:  X5   Recall/memory:  Normal   Affect and Mood  Affect:  Constricted; Depressed   Mood:  Depressed; Irritable   Relating  Eye contact:  Normal   Facial expression:  Constricted   Attitude toward examiner:  Irritable; Cooperative   Thought and Language  Speech flow: Loud; Clear and Coherent   Thought content:  Appropriate to Mood and Circumstances   Preoccupation:  None   Hallucinations:  None   Organization:  No data recorded  [employer] of Knowledge:  Average   Intelligence:  Average   Abstraction:  Normal   Judgement:  Fair   Reality Testing:  Adequate   Insight:  Fair   Decision Making:  Vacilates   Social Functioning  Social Maturity:  Isolates   Social Judgement:  Victimized   Stress  Stressors:  Housing; Relationship   Coping Ability:  Exhausted   Skill Deficits:  Decision making   Supports:  Other (Comment); Family      Religion: Religion/Spirituality Are You A Religious Person?: Yes What is Your Religious Affiliation?: Christian  Leisure/Recreation: Leisure / Recreation Do You Have Hobbies?: No  Exercise/Diet: Exercise/Diet Do You Exercise?: Yes What Type of Exercise Do You Do?: Run/Walk (patient walks to places that she needs to go) How Many Times a Week Do You Exercise?: 6-7 times a week Have You Gained or Lost A Significant Amount of Weight in the Past Six Months?: No Do You Follow a Special Diet?: No Do You Have Any Trouble Sleeping?: Yes Explanation of Sleeping Difficulties: insomnia, nightmares   CCA Employment/Education Employment/Work Situation: Employment / Work Situation Employment situation: On disability Why  is patient on disability: Bipolar I How long has patient been on disability: UTA Patient's job has been impacted by current illness: No What is the longest time patient has a held a job?: UTA Where was the patient employed at that time?: [employer] Has patient ever been in the [employer]?: No  Education: Education Is Patient Currently Attending School?: No Last Grade Completed:  (not assessed) Name of High School: not assessed Did Teacher, adult education From [employer]?: No (not assessed) Did You Attend College?: No Did You Attend Graduate School?:  (not assessed) Did You Have Any Special Interests In School?: not assessed Did You Have An Individualized Education Program (IIEP): No Did You Have Any Difficulty At School?: No Patient's Education Has Been Impacted by Current Illness: No   CCA Family/Childhood History Family and Relationship History: Family history Marital status: Single Are you sexually active?: Yes What is your sexual orientation?: heterosexual Has your sexual activity been affected by drugs, alcohol, medication, or emotional stress?: Client denies.  Does patient have children?: Yes How many children?: 3 How is patient's relationship with their children?:  Three daughters, ages 7, 20, 53, who are in foster care and adoption process  Childhood History:  Childhood History By whom was/is the patient raised?: Mother Additional childhood history information: Patient reports "I only seen him (father) about three times in my life and now he don't want nothing to do with me." Description of patient's relationship with caregiver when they were a child: Patient reports "it was good, she raised me good".  Patient's description of current relationship with people who raised him/her: Patient states that she still has a good relationship with her mother How were you disciplined when you got in trouble as a child/adolescent?: Patient reports "nothing". Does patient have siblings?: Yes Description of patient's current relationship with siblings: Patient reports "I have two sisters, one I get along with one of them.  The other I don't hear from that much.  Ever since she married that crazy boy." Did patient suffer any verbal/emotional/physical/sexual abuse as a child?: No Did patient suffer from severe childhood neglect?: No Has patient ever been sexually abused/assaulted/raped as an adolescent or adult?: No Was the patient ever a victim of a crime or a disaster?: No Witnessed domestic violence?: No Has patient been affected by domestic violence as an adult?: No  Child/Adolescent Assessment:     CCA Substance Use Alcohol/Drug Use:    ASAM's:  Six Dimensions of Multidimensional Assessment  Dimension 1:  Acute Intoxication and/or Withdrawal Potential:      Dimension 2:  Biomedical Conditions and Complications:      Dimension 3:  Emotional, Behavioral, or Cognitive Conditions and Complications:     Dimension 4:  Readiness to Change:     Dimension 5:  Relapse, Continued use, or Continued Problem Potential:     Dimension 6:  Recovery/Living Environment:     ASAM Severity Score:    ASAM Recommended Level of Treatment:     Substance use Disorder (SUD)     Recommendations for Services/Supports/Treatments:    DSM5 Diagnoses: Patient Active Problem List   Diagnosis Date Noted  . Gastroesophageal reflux disease 05/04/2020  . Hyperglycemia due to type 2 diabetes mellitus ([place]) 05/04/2020  . Long term (current) use of insulin ([place]) 05/04/2020  . Migraine without aura 05/04/2020  . Morbid obesity ([place]) 05/04/2020  . 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 2 diabetes mellitus ([place]) 05/04/2020  . Prolapsed internal hemorrhoids 05/04/2020  . Vitamin D deficiency 05/04/2020  .  Other symptoms and signs involving cognitive functions and awareness 05/04/2020  . Suicide attempt ([place])   . Anxiety state 03/06/2020  . Schizophrenia ([place]) 09/13/2019  . Bipolar I disorder, most recent episode depressed ([place]) 06/23/2019  . MDD (major depressive disorder) 10/10/2018  . Schizoaffective disorder, bipolar type ([place]) 09/25/2018  . Affective psychosis, bipolar ([place]) 06/13/2018  . HTN (hypertension) 05/03/2018  . Tobacco use disorder 05/03/2018  . Adjustment disorder with emotional disturbance 01/02/2018  . 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 Moderate bipolar I disorder, most recent episode depressed ([place])   . Psychosis ([place])   . 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 OCD (obsessive compulsive disorder) 10/05/2016  . Major depressive disorder, recurrent episode, mild ([place]) 05/04/2016  . 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 Learning disability 07/18/2015  . 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 Diabetes mellitus ([place]) 07/18/2015  . 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 Hyperlipidemia 07/18/2015  . Severe episode of recurrent major depressive disorder, without psychotic features ([place])   . Suicidal ideation   . Drug overdose   . Cognitive deficits 10/12/2012  . Generalized anxiety disorder 06/28/2012    Disposition:   Per Oneida Alar, NP, patient can be transferred to [facility] for continuous assessment if her Covid screen is negative and she will bre re-evaluated in the morning by a provider.  Referrals to Alternative Service(s): Referred to Alternative Service(s):   Place:   Date:   Time:    Referred to Alternative Service(s):   Place:   Date:   Time:    Referred to Alternative Service(s):   Place:   Date:   Time:    Referred to Alternative Service(s):   Place:   Date:   Time:     Arnisha Laffoon J Akylah Hascall, LCAS

## 2020-05-27 MED ORDER — HYDROXYZINE HCL 25 MG PO TABS
25.0000 mg | ORAL_TABLET | Freq: Three times a day (TID) | ORAL | Status: DC | PRN
  Administered 2020-05-27: 25 mg via ORAL
  Filled 2020-05-27: qty 1

## 2020-05-27 MED ORDER — HYDROXYZINE HCL 25 MG PO TABS: 25.0000 mg | ORAL_TABLET | Freq: Three times a day (TID) | ORAL | 0 refills | Status: DC | PRN

## 2020-05-27 NOTE — ED Notes (Signed)
Pt resting on pull out with eyes closed unlabored respirations through night in no acute distress. Safety maintained.

## 2020-05-27 NOTE — Discharge Instructions (Addendum)
Take all medications as prescribed. Keep all follow-up appointments as scheduled.  Do not consume alcohol or use illegal drugs while on prescription medications. Report any adverse effects from your medications to your primary care provider promptly.  In the event of recurrent symptoms or worsening symptoms, call 911, a crisis hotline, or go to the nearest emergency department for evaluation.   

## 2020-05-27 NOTE — ED Notes (Signed)
Patient A&O x 4, ambulatory. Patient discharged in no acute distress. Patient denied SI/HI, A/VH upon discharge. Patient verbalized understanding of all discharge instructions explained by staff, to include follow up appointments, RX's and safety plan. Patient reported mood 10/10. sPt belongings (shoes, coat belt, keys, wallet) returned to pt from locker #31 intact. Patient escorted to lobby via staff for transport to destination. Safety maintained.

## 2020-05-27 NOTE — ED Notes (Signed)
Safe transport called to take pt home

## 2020-05-27 NOTE — ED Provider Notes (Signed)
[facility]/OBS ASAP Discharge Summary  Date and Time: 05/27/2020 9:57 AM  Name: [name]  MRN:  [id number]   Discharge Diagnoses:  Final diagnoses:  Cognitive deficits  Suicidal ideation   Evaluation: [name] is 30 year old African-American female well-known to this service.  Currently denying suicidal or homicidal ideations.  Denies auditory or visual hallucinations.  Does report worsening anxiety.  "  I was wondering if I can be started on duloxetine my mom takes this medication for her anxiety."  Reports outpatient resources provided by [facility] services.  Rates her depression 3 out of 10 with 10 being the worst.  Patient to keep all follow-up outpatient appointments.  Support encouragement, reassurance was provided.  HPI: TPer admission assessment note: his is patient's third attempt to gain admission to Psychiatry this week.  Patient was just discharged from the inpatient unit on 05/04/20 and is followed by the [facility] and was lst seen three days ago.  Today, patient states that she intentionally overdosed on 5-6 Gabapentin pills in what she describes as a suicide attempt, however, the dosage she took was not one that was lethal.  Patient states that she does not feel like her medications are working and states, "I need some help."  Patient states, if I am not admitted to the hospital this time, I swear to you I will go home and take more pills.  Patient's main issue is that she is very lonely living independently.  She states that she would like to go to a group home and she has asked her ACT Team to get her into one, but she states, "they are dragging their feet."  Patient denies current HI and states that she is not experiencing AVH.  Patient denies any drug or alcohol use. Patient states that her sleep and appetite have not been that good lately.  Patient states, "I need to be in the hospital for several days so that they can monitor my medications in order to make sure  they are the right ones  Total Time spent with patient: 15 minutes  Past Psychiatric History:  Past Medical History:  Past Medical History:  Diagnosis Date  . Anxiety   . Bipolar 1 disorder ([place])   . Cognitive deficits   . Depression   . Diabetes mellitus without complication ([place])   . Hypertension   . Mental disorder   . Mental health disorder   . Obesity     Past Surgical History:  Procedure Laterality Date  . CESAREAN SECTION    . CESAREAN SECTION N/A 04/25/2013   Procedure: REPEAT CESAREAN SECTION;  Surgeon: [name], MD;  Location: [facility];  Service: Obstetrics;  Laterality: N/A;  . MASS EXCISION N/A 06/03/2012   Procedure: EXCISION MASS;  Surgeon: [name], MD;  Location: [facility];  Service: ENT;  Laterality: N/A;  Excision uvula mass  . TONSILLECTOMY N/A 06/03/2012   Procedure: TONSILLECTOMY;  Surgeon: [name], MD;  Location: [facility];  Service: ENT;  Laterality: N/A;  . TONSILLECTOMY     Family History:  Family History  Problem Relation Age of Onset  . Hypertension Mother   . Diabetes Father    Family Psychiatric History: Social History:  Social History   Substance and Sexual Activity  Alcohol Use Not Currently   Comment: occ: last intake 4 mts ago     Social History   Substance and Sexual Activity  Drug Use Not Currently  . Types: "Crack"  cocaine, Other-see comments   Comment: Patient reports hx of smoking Crack    Social History   Socioeconomic History  . Marital status: Single    Spouse name: Not on file  . Number of children: Not on file  . Years of education: Not on file  . Highest education level: Not on file  Occupational History  . Not on file  Tobacco Use  . Smoking status: Current Every Day Smoker    Packs/day: 4.00    Years: 11.00    Pack years: 44.00    Types: Cigarettes, Cigars  . Smokeless tobacco: Never Used  . Tobacco comment: Pt declined  Vaping Use  . Vaping Use: Never used   Substance and Sexual Activity  . Alcohol use: Not Currently    Comment: occ: last intake 4 mts ago  . Drug use: Not Currently    Types: "Crack" cocaine, Other-see comments    Comment: Patient reports hx of smoking Crack  . Sexual activity: Yes    Birth control/protection: Implant  Other Topics Concern  . Not on file  Social History Narrative   ** Merged History Encounter **       Social Determinants of Health   Financial Resource Strain: Not on file  Food Insecurity: Not on file  Transportation Needs: Not on file  Physical Activity: Not on file  Stress: Not on file  Social Connections: Not on file   SDOH:  SDOH Screenings   Alcohol Screen: Low Risk   . Last Alcohol Screening Score (AUDIT): 3  Depression (PHQ2-9): Medium Risk  . PHQ-2 Score: 18  Financial Resource Strain: Not on file  Food Insecurity: Not on file  Housing: Not on file  Physical Activity: Not on file  Social Connections: Not on file  Stress: Not on file  Tobacco Use: High Risk  . Smoking Tobacco Use: Current Every Day Smoker  . Smokeless Tobacco Use: Never Used  Transportation Needs: Not on file    Has this patient used any form of tobacco in the last 30 days? (Cigarettes, Smokeless Tobacco, Cigars, and/or Pipes) Prescription not provided because: non-smoker  Current Medications:  Current Facility-Administered Medications  Medication Dose Route Frequency Provider Last Rate Last Admin  . gabapentin (NEURONTIN) capsule 300 mg  300 mg Oral TID Leevy-Johnson, Brooke A, NP   300 mg at 05/26/20 2108  . haloperidol (HALDOL) tablet 10 mg  10 mg Oral QHS Leevy-Johnson, Brooke A, NP   10 mg at 05/26/20 2108  . hydrOXYzine (ATARAX/VISTARIL) tablet 25 mg  25 mg Oral TID PRN Ival Bible, MD   25 mg at 05/27/20 0847  . propranolol (INDERAL) tablet 10 mg  10 mg Oral BID Leevy-Johnson, Brooke A, NP   10 mg at 05/27/20 0847  . traZODone (DESYREL) tablet 50 mg  50 mg Oral QHS PRN Leevy-Johnson, Blaine Hamper, NP        Current Outpatient Medications  Medication Sig Dispense Refill  . BANOPHEN 25 MG capsule Take 25 mg by mouth at bedtime.    . benztropine (COGENTIN) 1 MG tablet Take 1 mg by mouth 2 (two) times daily.    . cholecalciferol (VITAMIN D) 25 MCG (1000 UNIT) tablet Take 1,000 Units by mouth daily.    Marland Kitchen gabapentin (NEURONTIN) 300 MG capsule Take 1 capsule (300 mg total) by mouth 3 (three) times daily. 90 capsule 0  . glipiZIDE (GLUCOTROL XL) 10 MG 24 hr tablet Take 10 mg by mouth once.    . haloperidol (  HALDOL) 10 MG tablet Take 10 mg by mouth at bedtime.    . haloperidol decanoate (HALDOL DECANOATE) 50 MG/ML injection Inject 50 mg into the muscle every 28 (twenty-eight) days.    . insulin detemir (LEVEMIR) 100 UNIT/ML injection Inject 0.4 mLs (40 Units total) into the skin 2 (two) times daily. 10 mL 11  . lithium carbonate (LITHOBID) 300 MG CR tablet Take 2 tablets (600 mg total) by mouth every 12 (twelve) hours. (Patient taking differently: Take 600 mg by mouth 3 (three) times daily.) 120 tablet 0  . omeprazole (PRILOSEC) 20 MG capsule Take 20 mg by mouth daily.    . ondansetron (ZOFRAN ODT) 4 MG disintegrating tablet Take 1 tablet (4 mg total) by mouth every 8 (eight) hours as needed for nausea or vomiting. 20 tablet 0  . propranolol (INDERAL) 10 MG tablet Take 1 tablet (10 mg total) by mouth 2 (two) times daily. 60 tablet 0  . simvastatin (ZOCOR) 20 MG tablet Take 20 mg by mouth at bedtime.    Marland Kitchen VISTARIL 25 MG capsule Take 25 mg by mouth at bedtime.    . vitamin B-12 (CYANOCOBALAMIN) 1000 MCG tablet Take 1,000 mcg by mouth daily.      PTA Medications: (Not in a hospital admission)   Musculoskeletal  Strength & Muscle Tone: within normal limits Gait & Station: normal Patient leans: N/A  Psychiatric Specialty Exam  Presentation  General Appearance: Casual  Eye Contact:Fair  Speech:Clear and Coherent  Speech Volume:Normal  Handedness:Right   Mood and Affect   Mood:Dysphoric  Affect:Blunt; Congruent   Thought Process  Thought Processes:Coherent  Descriptions of Associations:Tangential  Orientation:Full (Time, Place and Person)  Thought Content:Other (comment) (concrete)  Hallucinations:Hallucinations: None  Ideas of Reference:None  Suicidal Thoughts:Suicidal Thoughts: Yes, Passive SI Active Intent and/or Plan: With Plan; With Intent  Homicidal Thoughts:Homicidal Thoughts: No   Sensorium  Memory:Immediate Fair; Recent Fair; Remote Fair  Judgment:Poor  Insight:Shallow   Executive Functions  Concentration:Fair  Attention Span:Fair  [place] of Knowledge:Poor  Language:Fair   Psychomotor Activity  Psychomotor Activity:Psychomotor Activity: Normal   Assets  Assets:Financial Resources/Insurance; Housing; Physical Health; Resilience; Social Support   Sleep  Sleep:Sleep: Fair   Physical Exam  Physical Exam ROS Blood pressure 120/88, pulse 76, temperature 97.7 F (36.5 C), temperature source Temporal, resp. rate 20, last menstrual period 05/04/2020, SpO2 100 %. There is no height or weight on file to calculate BMI.  Demographic Factors:  Low socioeconomic status and Living alone  Loss Factors: Loss of significant relationship  Historical Factors: Impulsivity  Risk Reduction Factors:   Positive social support and Positive coping skills or problem solving skills  Continued Clinical Symptoms:  Depression:   Severe  Cognitive Features That Contribute To Risk:  Closed-mindedness    Suicide Risk:  Mild:  Suicidal ideation of limited frequency, intensity, duration, and specificity.  There are no identifiable plans, no associated intent, mild dysphoria and related symptoms, good self-control (both objective and subjective assessment), few other risk factors, and identifiable protective factors, including available and accessible social support.  Plan Of Care/Follow-up recommendations:  Activity:   as tolorated Diet:  heart healthy  - Patient to keep follow-up appointment  with [facility] services  Disposition: Take all medications as prescribed. Keep all follow-up appointments as scheduled.  Do not consume alcohol or use illegal drugs while on prescription medications. Report any adverse effects from your medications to your primary care provider promptly.  In the event of recurrent symptoms or worsening  symptoms, call 911, a crisis hotline, or go to the nearest emergency department for evaluation.   Derrill Center, NP 05/27/2020, 9:57 AM

## 2020-05-27 NOTE — ED Notes (Signed)
Pt pacing unit, requesting medication for anxiety. Pt received Vistaril for increased anxiety. Tolerated well. Denies SI/HI/AVH. Pt reports, "I feel better". Support and encouragement provided. Informed pt to notify staff with any needs or concerns. Will continue to monitor for safety.

## 2020-05-27 NOTE — ED Notes (Signed)
Pt sitting up on side of bed. Denies concerns or needs at present. Will continue to monitor.

## 2020-05-28 ENCOUNTER — Other Ambulatory Visit: Payer: Self-pay

## 2020-05-28 ENCOUNTER — Encounter (HOSPITAL_COMMUNITY): Payer: Self-pay | Admitting: Emergency Medicine

## 2020-05-28 ENCOUNTER — Emergency Department (HOSPITAL_COMMUNITY)
Admission: EM | Admit: 2020-05-28 | Discharge: 2020-05-28 | Disposition: A | Discharge status: Home / Self Care | Payer: Medicaid Other | Attending: Emergency Medicine | Admitting: Emergency Medicine

## 2020-05-28 DIAGNOSIS — F3130 Bipolar disorder, current episode depressed, mild or moderate severity, unspecified: Secondary | ICD-10-CM | POA: Insufficient documentation

## 2020-05-28 DIAGNOSIS — F419 Anxiety disorder, unspecified: Secondary | ICD-10-CM | POA: Insufficient documentation

## 2020-05-28 DIAGNOSIS — Z794 Long term (current) use of insulin: Secondary | ICD-10-CM | POA: Insufficient documentation

## 2020-05-28 DIAGNOSIS — E1142 Type 2 diabetes mellitus with diabetic polyneuropathy: Secondary | ICD-10-CM | POA: Diagnosis not present

## 2020-05-28 DIAGNOSIS — Z79899 Other long term (current) drug therapy: Secondary | ICD-10-CM | POA: Diagnosis not present

## 2020-05-28 DIAGNOSIS — F17210 Nicotine dependence, cigarettes, uncomplicated: Secondary | ICD-10-CM | POA: Insufficient documentation

## 2020-05-28 DIAGNOSIS — I10 Essential (primary) hypertension: Secondary | ICD-10-CM | POA: Insufficient documentation

## 2020-05-28 DIAGNOSIS — Z7984 Long term (current) use of oral hypoglycemic drugs: Secondary | ICD-10-CM | POA: Diagnosis not present

## 2020-05-28 LAB — I-STAT BETA HCG BLOOD, ED (MC, WL, AP ONLY): I-stat hCG, quantitative: <5 m[IU]/mL (ref <5)

## 2020-05-28 LAB — CBC
HCT: 35.8 % — ABNORMAL LOW (ref 36.0–46.0)
Hemoglobin: 11.6 g/dL — ABNORMAL LOW (ref 12.0–15.0)
MCH: 25.1 pg — ABNORMAL LOW (ref 26.0–34.0)
MCHC: 32.4 g/dL (ref 30.0–36.0)
MCV: 77.5 fL — ABNORMAL LOW (ref 80.0–100.0)
Platelets: 270 10*3/uL (ref 150–400)
RBC: 4.62 MIL/uL (ref 3.87–5.11)
RDW: 15.6 % — ABNORMAL HIGH (ref 11.5–15.5)
WBC: 10.4 10*3/uL (ref 4.0–10.5)
nRBC: 0 % (ref 0.0–0.2)

## 2020-05-28 LAB — ETHANOL: Alcohol, Ethyl (B): <10 mg/dL (ref <10)

## 2020-05-28 LAB — SALICYLATE LEVEL: Salicylate Lvl: <7 mg/dL — ABNORMAL LOW (ref 7.0–30.0)

## 2020-05-28 LAB — COMPREHENSIVE METABOLIC PANEL
ALT: 20 U/L (ref 0–44)
AST: 22 U/L (ref 15–41)
Albumin: 3.4 g/dL — ABNORMAL LOW (ref 3.5–5.0)
Alkaline Phosphatase: 43 U/L (ref 38–126)
Anion gap: 9 (ref 5–15)
BUN: 13 mg/dL (ref 6–20)
CO2: 24 mmol/L (ref 22–32)
Calcium: 9.3 mg/dL (ref 8.9–10.3)
Chloride: 105 mmol/L (ref 98–111)
Creatinine, Ser: 0.7 mg/dL (ref 0.44–1.00)
GFR, Estimated: >60 mL/min (ref >60)
Glucose, Bld: 152 mg/dL — ABNORMAL HIGH (ref 70–99)
Potassium: 3.9 mmol/L (ref 3.5–5.1)
Sodium: 138 mmol/L (ref 135–145)
Total Bilirubin: 0.7 mg/dL (ref 0.3–1.2)
Total Protein: 6.5 g/dL (ref 6.5–8.1)

## 2020-05-28 LAB — CBG MONITORING, ED: Glucose-Capillary: 149 mg/dL — ABNORMAL HIGH (ref 70–99)

## 2020-05-28 LAB — ACETAMINOPHEN LEVEL: Acetaminophen (Tylenol), Serum: <10 ug/mL — ABNORMAL LOW (ref 10–30)

## 2020-05-28 MED ORDER — LORAZEPAM 1 MG PO TABS
1.0000 mg | ORAL_TABLET | Freq: Once | ORAL | Status: AC
  Administered 2020-05-28: 1 mg via ORAL
  Filled 2020-05-28: qty 1

## 2020-05-28 NOTE — ED Notes (Signed)
Pt changed into scrubs and belongings obtained. [employer] remains at bedside

## 2020-05-28 NOTE — BH Assessment (Signed)
Comprehensive Clinical Assessment (CCA) Note  05/28/2020 [name] [id number]  [name] is a 30 year old female presenting to [facility] by [employer] voluntarily with chief complaint of an intentional overdose to end her life. Patient has been seen by [facility] many times over the past couple of weeks for similar reasons. Patient state "every day I am depressed and angry and my medications not working". Patient reports taking 5 gabapentin pills around 3:00pm today and afterwards she called police to bring her to ED. Patient reports that she has attempted suicide about 3-4 times in the last three weeks. Patient reports currently taking hydroxyzine, Buspar, gabapentin and Haldol injection. Patient reports her last Haldol injection was about 2 weeks ago given by [name] provider. Patient reports that ACTT sees her about 2-3 times a week and their last visit with her was yesterday. Patient reports receiving a month worth of prepackaged medications. Patient reports stressors of medication issues and dissatisfaction with her housing situation "I want to go to a group home". Patient reports symptoms of feeling irritable, angry, poor sleep, increased appetite and depressed.   Patient is oriented to person, place and situation; she is alert engaged and cooperative during assessment. Patient eye contact and tone of voice is normal, and she does not appear in distress or responding to internal stimuli. Patient reports suicidal ideation with plan to OD on medications. Patient state "If I get sent home, I'm going to take even more meds". Patient denies HI/AVH/SIB and substance use. Patient is requesting to go inpatient but does not want to go to [facility] and would rather go to [facility]           [facility] staff member Court Trueblood was notified of patient ED visit. Staff reports that patient has been calling crisis line before calling 911 with report of OD. Staff report they are unable to pick  patient up from ED tonight due to [place] but will make phone contact tonight when patient is discharged; staff patient in their morning meeting and plan on seeing patient tomorrow for hospital follow up.    Per Harriett Sine, NP, patient is psychiatrically cleared and recommended for discharge.   Chief Complaint:  Chief Complaint  Patient presents with  . Psychiatric Evaluation  . Suicide Attempt   Visit Diagnosis: F31.30 Bipolar Disorder Depressed    CCA Screening, Triage and Referral (STR)  Patient Reported Information How did you hear about [place]? Other (Comment) (Patient brought in by EMS for an intentional overdose)  Referral name: self  Referral phone number: No data recorded  Whom do you see for routine medical problems? Primary Care  Practice/Facility Name: [facility]-Dr Jake Samples  Practice/Facility Phone Number: [phone]  Name of Contact: [place]  Contact Number: [phone] [name] 04/11/2020)  Contact Fax Number: [phone] ([place] 04/11/2020)  Prescriber Name: Unknown [name] 05/22/2020)  Prescriber Address (if known): Unknown ([employer] 05/22/2020)   What Is the Reason for Your Visit/Call Today? suicidal, overdosed on Neurontin in a suicide attempt.  Patient took more pills than prescribed, but not a lethal amount  How Long Has This Been Causing You Problems? 1-6 months  What Do You Feel Would Help You the Most Today? -- (patient is requesting hospitalization)   Have You Recently Been in Any Inpatient Treatment (Hospital/Detox/Crisis Center/28-Day Program)? Yes  Name/Location of Program/Hospital:[facility]  How Long Were You There? 5 days  When Were You Discharged? 05/04/2020   Have You Ever Received Services From [employer] Before? Yes  Who Do You See at [facility]? [facility] inpatient treatment   Have You Recently Had Any Thoughts About Hurting Yourself? Yes  Are You Planning to Commit Suicide/Harm Yourself At This time? Yes   Have you  Recently Had Thoughts About Hurting Someone [name]? No  Explanation: No data recorded  Have You Used Any Alcohol or Drugs in the Past 24 Hours? No  How Long Ago Did You Use Drugs or Alcohol? No data recorded What Did You Use and How Much? No data recorded  Do You Currently Have a Therapist/Psychiatrist? No  Name of Therapist/Psychiatrist: [facility]   Have You Been Recently Discharged From Any Office Practice or Programs? No  Explanation of Discharge From Practice/Program: No data recorded    CCA Screening Triage Referral Assessment Type of Contact: Tele-Assessment  Is this Initial or Reassessment? Initial Assessment  Date Telepsych consult ordered in [facility]:  05/26/2020  Time Telepsych consult ordered in [facility]:  1430   Patient Reported Information Reviewed? Yes  Patient Left Without Being Seen? No data recorded Reason for Not Completing Assessment: No data recorded  Collateral Involvement: none available   Does Patient Have a Court Appointed Legal Guardian? No data recorded Name and Contact of Legal Guardian: Self.   If Minor and Not Living with Parent(s), Who has Custody? Self  Is CPS involved or ever been involved? Never  Is APS involved or ever been involved? Never   Patient Determined To Be At Risk for Harm To Self or Others Based on Review of Patient Reported Information or Presenting Complaint? Yes, for Self-Harm  Method: No data recorded Availability of Means: No data recorded Intent: No data recorded Notification Required: No data recorded Additional Information for Danger to Others Potential: No data recorded Additional Comments for Danger to Others Potential: No data recorded Are There Guns or Other Weapons in Your Home? No data recorded Types of Guns/Weapons: No data recorded Are These Weapons Safely Secured?                            No data recorded Who Could Verify You Are Able To Have These Secured: No data recorded Do You Have any  Outstanding Charges, Pending Court Dates, Parole/Probation? No data recorded Contacted To Inform of Risk of Harm To Self or Others: Unable to Contact: (TTS or Social work will notify [facility] of patient's ED admission)   Location of Assessment: [facility] ED   Does Patient Present under Involuntary Commitment? No  IVC Papers Initial File Date: No data recorded  [place] of Residence: [place]   Patient Currently Receiving the Following Services: [facility] Architect)   Determination of Need: Emergent (2 hours)   Options For Referral: Other: Comment (Continuous [facility] at the [facility])     CCA Biopsychosocial Intake/Chief Complaint:  This is patient's third attempt to gain admission to Psychiatry this week.  Patient was just discharged from the inpatient unit on 05/04/20 and is followed by the [facility] and was lst seen three days ago.  Today, patient states that she intentionally overdosed on 5-6 Gabapentin pills in what she describes as a suicide attempt, however, the dosage she took was not one that was lethal.  Patient states that she does not feel like her medications are working and states, "I need some help."  Patient states, if I am not admitted to the hospital this time, I swear to you I will go home and take  more pills.  Patient's main issue is that she is very lonely living independently.  She states that she would like to go to a group home and she has asked her ACT Team to get her into one, but she states, "they are dragging their feet."  Patient denies current HI and states that she is not experiencing AVH.  Patient denies any drug or alcohol use. Patient states that her sleep and appetite have not been that good lately.  Patient states, "I need to be in the hospital for several days so that they can monitor my medications in order to make sure they are the right ones."  Current Symptoms/Problems: Suicidal ideation, loneliness,  despondency   Patient Reported Schizophrenia/Schizoaffective Diagnosis in Past: Yes   Strengths: Communication  Preferences: Pt wants to be placed in a group home --''I can't live independently''  Abilities: Patient was not able to identify any abilities that she has   Type of Services Patient Feels are Needed: Housing   Initial Clinical Notes/Concerns: Pt has numerous presentations to the ED, [place], and [place].  Pt endorsed suicidal ideation.  She stated to NP that she is lonely.   Mental Health Symptoms Depression:  Change in energy/activity; Hopelessness; Increase/decrease in appetite; Irritability; Sleep (too much or little); Weight gain/loss; Worthlessness; Fatigue   Duration of Depressive symptoms: Greater than two weeks   Mania:  N/A   Anxiety:   N/A   Psychosis:  None   Duration of Psychotic symptoms: No data recorded  Trauma:  None   Obsessions:  None   Compulsions:  None   Inattention:  None   Hyperactivity/Impulsivity:  N/A   Oppositional/Defiant Behaviors:  None   Emotional Irregularity:  Recurrent suicidal behaviors/gestures/threats; Mood lability; Unstable self-image   Other Mood/Personality Symptoms:  NA    Mental Status Exam Appearance and self-care  Stature:  Average   Weight:  Overweight   Clothing:  Casual   Grooming:  Normal   Cosmetic use:  Age appropriate   Posture/gait:  Normal   Motor activity:  Not Remarkable   Sensorium  Attention:  Normal   Concentration:  Normal   Orientation:  X5   Recall/memory:  Normal   Affect and Mood  Affect:  Constricted; Depressed   Mood:  Depressed; Irritable   Relating  Eye contact:  Normal   Facial expression:  Constricted   Attitude toward examiner:  Irritable; Cooperative   Thought and Language  Speech flow: Loud; Clear and Coherent   Thought content:  Appropriate to Mood and Circumstances   Preoccupation:  None   Hallucinations:  None   Organization:  No data recorded   [employer] of Knowledge:  Average   Intelligence:  Average   Abstraction:  Normal   Judgement:  Fair   Reality Testing:  Adequate   Insight:  Fair   Decision Making:  Vacilates   Social Functioning  Social Maturity:  Isolates   Social Judgement:  Victimized   Stress  Stressors:  Housing; Relationship   Coping Ability:  Exhausted   Skill Deficits:  Decision making   Supports:  Other (Comment); Family     Religion: Religion/Spirituality Are You A Religious Person?: Yes What is Your Religious Affiliation?: Christian  Leisure/Recreation: Leisure / Recreation Do You Have Hobbies?: No  Exercise/Diet: Exercise/Diet Do You Exercise?: Yes What Type of Exercise Do You Do?: Run/Walk (patient walks to places that she needs to go) How Many Times a Week Do You Exercise?: 6-7 times  a week Have You Gained or Lost A Significant Amount of Weight in the Past Six Months?: No Do You Follow a Special Diet?: No Do You Have Any Trouble Sleeping?: Yes Explanation of Sleeping Difficulties: insomnia, nightmares   CCA Employment/Education Employment/Work Situation: Employment / Work Situation Employment situation: On disability Why is patient on disability: Bipolar I How long has patient been on disability: UTA Patient's job has been impacted by current illness: No What is the longest time patient has a held a job?: UTA Where was the patient employed at that time?: [employer] Has patient ever been in the [employer]?: No  Education: Education Last Grade Completed:  (not assessed) Name of [place]: not assessed Did Teacher, adult education From [employer]?: No (not assessed) Did You Attend College?: No Did You Attend Graduate School?:  (not assessed) Did You Have Any Special Interests In School?: not assessed Did You Have An Individualized Education Program (IIEP): No Did You Have Any Difficulty At School?: No   CCA Family/Childhood History Family and Relationship  History: Family history Marital status: Single Are you sexually active?: Yes What is your sexual orientation?: heterosexual Has your sexual activity been affected by drugs, alcohol, medication, or emotional stress?: Client denies.  Does patient have children?: Yes How many children?: 3 How is patient's relationship with their children?: Three daughters, ages 30, 61, 74, who are in foster care and adoption process  Childhood History:  Childhood History By whom was/is the patient raised?: Mother Additional childhood history information: Patient reports "I only seen him (father) about three times in my life and now he don't want nothing to do with me." Description of patient's relationship with caregiver when they were a child: Patient reports "it was good, she raised me good".  Patient's description of current relationship with people who raised him/her: Patient states that she still has a good relationship with her mother How were you disciplined when you got in trouble as a child/adolescent?: Patient reports "nothing". Does patient have siblings?: Yes Description of patient's current relationship with siblings: Patient reports "I have two sisters, one I get along with one of them.  The other I don't hear from that much.  Ever since she married that crazy boy." Did patient suffer any verbal/emotional/physical/sexual abuse as a child?: No Did patient suffer from severe childhood neglect?: No Has patient ever been sexually abused/assaulted/raped as an adolescent or adult?: No Was the patient ever a victim of a crime or a disaster?: No Witnessed domestic violence?: No Has patient been affected by domestic violence as an adult?: No  Child/Adolescent Assessment:     CCA Substance Use Alcohol/Drug Use: Alcohol / Drug Use Pain Medications: Please see MAR Prescriptions: Please see MAR Over the Counter: Please see MAR History of alcohol / drug use?: No history of alcohol / drug  abuse Longest period of sobriety (when/how long): NA                         ASAM's:  Six Dimensions of Multidimensional Assessment  Dimension 1:  Acute Intoxication and/or Withdrawal Potential:      Dimension 2:  Biomedical Conditions and Complications:      Dimension 3:  Emotional, Behavioral, or Cognitive Conditions and Complications:     Dimension 4:  Readiness to Change:     Dimension 5:  Relapse, Continued use, or Continued Problem Potential:     Dimension 6:  Recovery/Living Environment:     ASAM Severity Score:  ASAM Recommended Level of Treatment:     Substance use Disorder (SUD)    Recommendations for Services/Supports/Treatments:    DSM5 Diagnoses: Patient Active Problem List   Diagnosis Date Noted  . Gastroesophageal reflux disease 05/04/2020  . Hyperglycemia due to type 2 diabetes mellitus ([place]) 05/04/2020  . Long term (current) use of insulin ([place]) 05/04/2020  . Migraine without aura 05/04/2020  . Morbid obesity ([place]) 05/04/2020  . 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 2 diabetes mellitus ([place]) 05/04/2020  . Prolapsed internal hemorrhoids 05/04/2020  . Vitamin D deficiency 05/04/2020  . Other symptoms and signs involving cognitive functions and awareness 05/04/2020  . Suicide attempt ([place])   . Anxiety state 03/06/2020  . Schizophrenia ([place]) 09/13/2019  . Bipolar I disorder, most recent episode depressed ([place]) 06/23/2019  . MDD (major depressive disorder) 10/10/2018  . Schizoaffective disorder, bipolar type ([place]) 09/25/2018  . Affective psychosis, bipolar ([place]) 06/13/2018  . HTN (hypertension) 05/03/2018  . Tobacco use disorder 05/03/2018  . Adjustment disorder with emotional disturbance 01/02/2018  . 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 Moderate bipolar I disorder, most recent episode depressed ([place])   . Psychosis ([place])   . 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 OCD  (obsessive compulsive disorder) 10/05/2016  . Major depressive disorder, recurrent episode, mild ([place]) 05/04/2016  . 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 Learning disability 07/18/2015  . 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 Diabetes mellitus ([place]) 07/18/2015  . 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 Hyperlipidemia 07/18/2015  . Severe episode of recurrent major depressive disorder, without psychotic features ([place])   . Suicidal ideation   . Drug overdose   . Cognitive deficits 10/12/2012  . Generalized anxiety disorder 06/28/2012    Per Harriett Sine, NP, patient is psychiatrically cleared and recommended for discharge.   [place], [facility]

## 2020-05-28 NOTE — ED Provider Notes (Signed)
[facility] EMERGENCY DEPARTMENT Provider Note   CSN: [id number] Arrival date & time: 05/28/20  1617     History Chief Complaint  Patient presents with  . Psychiatric Evaluation  . Suicide Attempt    [name] is a 30 y.o. female.  30 year old female with prior medical history as detailed below presents for evaluation. Patient with multiple recent evaluations and visits to [facility] health facility.  Patient is reporting significant anxiety. She is here on a voluntary basis. She denies active suicidal ideation. She is requesting that she speak with psychiatry services.  The history is provided by the patient and medical records.  Mental Health Problem Presenting symptoms: agitation   Presenting symptoms comment:  Anxiety  Patient accompanied by:  Law enforcement Degree of incapacity (severity):  Mild Onset quality:  Unable to specify Timing:  Unable to specify Progression:  Unable to specify      Past Medical History:  Diagnosis Date  . Anxiety   . Bipolar 1 disorder ([place])   . Cognitive deficits   . Depression   . Diabetes mellitus without complication ([place])   . Hypertension   . Mental disorder   . Mental health disorder   . Obesity     Patient Active Problem List   Diagnosis Date Noted  . Gastroesophageal reflux disease 05/04/2020  . Hyperglycemia due to type 2 diabetes mellitus ([place]) 05/04/2020  . Long term (current) use of insulin ([place]) 05/04/2020  . Migraine without aura 05/04/2020  . Morbid obesity ([place]) 05/04/2020  . 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 2 diabetes mellitus ([place]) 05/04/2020  . Prolapsed internal hemorrhoids 05/04/2020  . Vitamin D deficiency 05/04/2020  . Other symptoms and signs involving cognitive functions and awareness 05/04/2020  . Suicide attempt ([place])   . Anxiety state 03/06/2020  . Schizophrenia ([place]) 09/13/2019  . Bipolar I disorder, most recent episode depressed ([place]) 06/23/2019  . MDD (major depressive disorder)  10/10/2018  . Schizoaffective disorder, bipolar type ([place]) 09/25/2018  . Affective psychosis, bipolar ([place]) 06/13/2018  . HTN (hypertension) 05/03/2018  . Tobacco use disorder 05/03/2018  . Adjustment disorder with emotional disturbance 01/02/2018  . 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 Moderate bipolar I disorder, most recent episode depressed ([place])   . Psychosis ([place])   . 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 OCD (obsessive compulsive disorder) 10/05/2016  . Major depressive disorder, recurrent episode, mild ([place]) 05/04/2016  . 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 Learning disability 07/18/2015  . 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 Diabetes mellitus ([place]) 07/18/2015  . 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 Hyperlipidemia 07/18/2015  . Severe episode of recurrent major depressive disorder, without psychotic features ([place])   . Suicidal ideation   . Drug overdose   . Cognitive deficits 10/12/2012  . Generalized anxiety disorder 06/28/2012    Past Surgical History:  Procedure Laterality Date  . CESAREAN SECTION    . CESAREAN SECTION N/A 04/25/2013   Procedure: REPEAT CESAREAN SECTION;  Surgeon: [name], MD;  Location: [facility];  Service: Obstetrics;  Laterality: N/A;  . MASS EXCISION N/A 06/03/2012   Procedure: EXCISION MASS;  Surgeon: [name], MD;  Location: [facility];  Service: ENT;  Laterality: N/A;  Excision uvula mass  . TONSILLECTOMY N/A 06/03/2012   Procedure: TONSILLECTOMY;  Surgeon: [name], MD;  Location: [facility];  Service: ENT;  Laterality: N/A;  .  TONSILLECTOMY       OB History    Gravida  3   Para  3   Term  3   Preterm  0   AB  0   Living  3     SAB  0   IAB  0   Ectopic  0   Multiple      Live Births  3           Family History  Problem  Relation Age of Onset  . Hypertension Mother   . Diabetes Father     Social History   Tobacco Use  . Smoking status: Current Every Day Smoker    Packs/day: 4.00    Years: 11.00    Pack years: 44.00    Types: Cigarettes, Cigars  . Smokeless tobacco: Never Used  . Tobacco comment: Pt declined  Vaping Use  . Vaping Use: Never used  Substance Use Topics  . Alcohol use: Not Currently    Comment: occ: last intake 4 mts ago  . Drug use: Not Currently    Types: "Crack" cocaine, Other-see comments    Comment: Patient reports hx of smoking Crack    Home Medications Prior to Admission medications   Medication Sig Start Date End Date Taking? Authorizing Provider  BANOPHEN 25 MG capsule Take 25 mg by mouth at bedtime. 04/30/20   [provider]  benztropine (COGENTIN) 1 MG tablet Take 1 mg by mouth 2 (two) times daily. 05/01/20   [provider]  cholecalciferol (VITAMIN D) 25 MCG (1000 UNIT) tablet Take 1,000 Units by mouth daily. 04/30/20   [provider]  glipiZIDE (GLUCOTROL XL) 10 MG 24 hr tablet Take 10 mg by mouth once.    [provider]  haloperidol (HALDOL) 10 MG tablet Take 10 mg by mouth at bedtime. 05/09/20   [provider]  haloperidol decanoate (HALDOL DECANOATE) 50 MG/ML injection Inject 50 mg into the muscle every 28 (twenty-eight) days. 03/19/20   [provider]  hydrOXYzine (ATARAX/VISTARIL) 25 MG tablet Take 1 tablet (25 mg total) by mouth 3 (three) times daily as needed for anxiety. 05/27/20   Derrill Center, NP  insulin detemir (LEVEMIR) 100 UNIT/ML injection Inject 0.4 mLs (40 Units total) into the skin 2 (two) times daily. 05/05/20   Montine Circle, PA-C  lithium carbonate (LITHOBID) 300 MG CR tablet Take 2 tablets (600 mg total) by mouth every 12 (twelve) hours. Patient taking differently: Take 600 mg by mouth 3 (three) times daily. 05/05/20 06/04/20  Montine Circle, PA-C  omeprazole (PRILOSEC) 20 MG capsule Take 20 mg  by mouth daily. 04/30/20   [provider]  ondansetron (ZOFRAN ODT) 4 MG disintegrating tablet Take 1 tablet (4 mg total) by mouth every 8 (eight) hours as needed for nausea or vomiting. 05/05/20   Montine Circle, PA-C  propranolol (INDERAL) 10 MG tablet Take 1 tablet (10 mg total) by mouth 2 (two) times daily. 05/05/20 06/04/20  Montine Circle, PA-C  simvastatin (ZOCOR) 20 MG tablet Take 20 mg by mouth at bedtime. 08/21/19   [provider]  vitamin B-12 (CYANOCOBALAMIN) 1000 MCG tablet Take 1,000 mcg by mouth daily. 11/13/19   [provider]    Allergies    Wellbutrin [bupropion], Omnipaque [iohexol], Penicillins, Cogentin [benztropine], and Depakote er [divalproex sodium er]  Review of Systems   Review of Systems  Psychiatric/Behavioral: Positive for agitation.  All other systems reviewed and are negative.   Physical Exam Updated Vital  Signs BP 116/88 (BP Location: Left Arm)   Pulse 97   Temp 98.4 F (36.9 C) (Oral)   Resp 15   LMP 05/04/2020   SpO2 98%   Physical Exam Vitals and nursing note reviewed.  Constitutional:      General: She is not in acute distress.    Appearance: Normal appearance. She is well-developed and well-nourished.  HENT:     Head: Normocephalic and atraumatic.     Mouth/Throat:     Mouth: Oropharynx is clear and moist.  Eyes:     Extraocular Movements: EOM normal.     Conjunctiva/sclera: Conjunctivae normal.     Pupils: Pupils are equal, round, and reactive to light.  Cardiovascular:     Rate and Rhythm: Normal rate and regular rhythm.     Heart sounds: Normal heart sounds.  Pulmonary:     Effort: Pulmonary effort is normal. No respiratory distress.     Breath sounds: Normal breath sounds.  Abdominal:     General: There is no distension.     Palpations: Abdomen is soft.     Tenderness: There is no abdominal tenderness.  Musculoskeletal:        General: No deformity or edema. Normal range of motion.     Cervical back:  Normal range of motion and neck supple.  Skin:    General: Skin is warm and dry.  Neurological:     General: No focal deficit present.     Mental Status: She is alert and oriented to person, place, and time. Mental status is at baseline.  Psychiatric:        Mood and Affect: Mood and affect normal.     Comments: Appears anxious. Denies active suicidal ideation or homicidal ideation.      ED Results / Procedures / Treatments   Labs (all labs ordered are listed, but only abnormal results are displayed) Labs Reviewed  COMPREHENSIVE METABOLIC PANEL - Abnormal; Notable for the following components:      Result Value   Glucose, Bld 152 (*)    Albumin 3.4 (*)    All other components within normal limits  SALICYLATE LEVEL - Abnormal; Notable for the following components:   Salicylate Lvl <6.2 (*)    All other components within normal limits  ACETAMINOPHEN LEVEL - Abnormal; Notable for the following components:   Acetaminophen (Tylenol), Serum <10 (*)    All other components within normal limits  CBC - Abnormal; Notable for the following components:   Hemoglobin 11.6 (*)    HCT 35.8 (*)    MCV 77.5 (*)    MCH 25.1 (*)    RDW 15.6 (*)    All other components within normal limits  CBG MONITORING, ED - Abnormal; Notable for the following components:   Glucose-Capillary 149 (*)    All other components within normal limits  ETHANOL  RAPID URINE DRUG SCREEN, HOSP PERFORMED  I-STAT BETA HCG BLOOD, ED ([facility], [facility], [facility] ONLY)    EKG None  Radiology No results found.  Procedures Procedures   Medications Ordered in ED Medications  LORazepam (ATIVAN) tablet 1 mg (1 mg Oral Given 05/28/20 1717)    ED Course  I have reviewed the triage vital signs and the nursing notes.  Pertinent labs & imaging results that were available during my care of the patient were reviewed by me and considered in my medical decision making (see chart for details).    MDM Rules/Calculators/A&P  MDM  Screen complete  Janann Dosie Skehan was evaluated in Emergency Department on 05/28/2020 for the symptoms described in the history of present illness. She was evaluated in the context of the global COVID-19 pandemic, which necessitated consideration that the patient might be at risk for infection with the SARS-CoV-2 virus that causes COVID-19. Institutional protocols and algorithms that pertain to the evaluation of patients at risk for COVID-19 are in a state of rapid change based on information released by regulatory bodies including the CDC and federal and state organizations. These policies and algorithms were followed during the patient's care in the ED.  Patient is presenting for evaluation.  She endorses significant anxiety.  She request psychiatric/TTS evaluation.  Patient is without significant acute medical complaint.  Screening labs are without significant abnormality.  Patient has been seen by TTS.  She is cleared for discharge.   Final Clinical Impression(s) / ED Diagnoses Final diagnoses:  Anxiety    Rx / DC Orders ED Discharge Orders    None       Valarie Merino, MD 05/28/20 1944

## 2020-05-28 NOTE — ED Notes (Signed)
Bag lunch and ginger ale provided

## 2020-05-28 NOTE — ED Triage Notes (Signed)
Pt arrives to ED voluntarily with [employer] for overdose and psych evaluation. Pt states she took 5 or 6 300mg  Gabapentin to try and kill herself.

## 2020-05-28 NOTE — ED Notes (Signed)
Called for sitter. Per staffing they will see what they can do

## 2020-05-28 NOTE — BH Assessment (Signed)
Sammuel Bailiff RN, notified through secure chat that per Harriett Sine, NP, patient is psych cleared and recommended for discharge.

## 2020-05-28 NOTE — Discharge Instructions (Addendum)
Please return for any problem.  °

## 2020-05-28 NOTE — Care Management (Signed)
ED  RNCM arranged car service via [facility] transportation, patient has waiver on record.

## 2020-05-28 NOTE — ED Notes (Signed)
Pt ambulating to bathroom at this time w/ safety sitter

## 2020-05-28 NOTE — ED Notes (Signed)
Pt  pacing in hallway

## 2020-05-28 NOTE — ED Notes (Signed)
Pt report she has anxiety, depression and suicidal and that she deals with it every day. Pt reports that she wants help, "please help me". Pt is voluntary at this time. Pt is restless.

## 2020-05-28 NOTE — ED Notes (Signed)
Pt being TTS'd at this time

## 2020-05-31 ENCOUNTER — Encounter (HOSPITAL_COMMUNITY): Payer: Self-pay

## 2020-05-31 ENCOUNTER — Other Ambulatory Visit: Payer: Self-pay

## 2020-05-31 ENCOUNTER — Ambulatory Visit (HOSPITAL_COMMUNITY)
Admission: EM | Admit: 2020-05-31 | Discharge: 2020-05-31 | Disposition: A | Discharge status: Home / Self Care | Payer: Medicaid Other | Attending: Psychiatry | Admitting: Psychiatry

## 2020-05-31 DIAGNOSIS — Z5989 Other problems related to housing and economic circumstances: Secondary | ICD-10-CM | POA: Diagnosis not present

## 2020-05-31 DIAGNOSIS — F4323 Adjustment disorder with mixed anxiety and depressed mood: Secondary | ICD-10-CM | POA: Diagnosis not present

## 2020-05-31 DIAGNOSIS — Z733 Stress, not elsewhere classified: Secondary | ICD-10-CM | POA: Diagnosis not present

## 2020-05-31 MED ORDER — DULOXETINE HCL 60 MG PO CPEP: 60.0000 mg | ORAL_CAPSULE | Freq: Every day | ORAL | 3 refills | Status: DC

## 2020-05-31 MED ORDER — DULOXETINE HCL 60 MG PO CPEP
60.0000 mg | ORAL_CAPSULE | Freq: Every day | ORAL | Status: DC
  Administered 2020-05-31: 60 mg via ORAL
  Filled 2020-05-31: qty 1

## 2020-05-31 NOTE — ED Notes (Signed)
Pt escorted to retrieve belongings. Ambulated per self. No s/s pain, discomfort, or acute distress noted. No SI, HI, or AVH noted. Escorted to front lobby. Educated on avs. Verbalized understanding. Stable at time of d/c

## 2020-05-31 NOTE — ED Triage Notes (Signed)
EMERGENT-- 30 yo female bought in by [employer] voluntarily endorsing SI. Pt yelling, shaking, "Are y'all going to help me this time. [place] Don't help me. I told y'all the medicine don't help. I OD 2 days ago on Gabapentin because I just couldn't take this anxiety anymore. I need to stop the Hydroxyzine too. It don't help". Pt vomit in bathroom of [place]. [employer] officer states EMS responded to a call a few days ago of possible OD and they ([employer]) have responded 4 times this week of suicidal complaints from pt. Pt requesting inpatient stay in long term hospital.

## 2020-05-31 NOTE — Discharge Instructions (Signed)

## 2020-05-31 NOTE — ED Notes (Signed)
Pt refuse vitals  

## 2020-05-31 NOTE — BH Assessment (Addendum)
Comprehensive Clinical Assessment (CCA) Note  05/31/2020 [name] [id number]  Chief Complaint:  Chief Complaint  Patient presents with  . Anxiety  . Suicidal  . Agitation   [name] is a 30 year old female presenting to [facility] with G[facility] due to ongoing depression and anxiety. Per GPD, EMS responded to a call a few days ago of possible OD and (GPD) have responded 4 times this week of suicidal complaints. TTS knows of patient history and has assessed patient before. Patient state "I need help, are you going to help me this time. I'm having a lot of anxiety and depression." Patient reports calling [employer] today due to mental health symptoms and having suicidal thoughts. Patient reports that she cannot stop shaking and reports that her medications are not working for her. Patient reports waking up every morning angry about her life. Patient state, "I hate my life and I'm tired of living like this" and reports stressors related to her mother not wanting to be around her or allowing her to see her puppy, no family or social support and not having activities to do during the day. Patient reports seeing her ACTT provider yesterday and reports calling them today to see if she would have a visit, but patient was not on schedule to be seen today.    Patient is oriented to person, place and situation. Patient eye contact is normal, patient is loud and screaming at times. Patient is very agitated, anxious and crying during assessment. Patient reports suicidal ideation with plan to OD on her insulin, patient denies suicidal attempt today but reports that if she is released, she will do something to hurt herself. Patient denies HI, AVH and SIB. Patient is adamant about receiving inpatient treatment.    Disposition: Per [name], NP, patient is psychiatrically cleared and recommended for discharge.   Visit Diagnosis: Adjustment disorder with mixed anxiety and depressed mood   CCA Screening, Triage and  Referral (STR)  Patient Reported Information How did you hear about [place]? Legal System  Referral name: GPD  Referral phone number: No data recorded  Whom do you see for routine medical problems? Primary Care  Practice/Facility Name: [facility]-Dr Jake Samples  Practice/Facility Phone Number: [phone]  Name of Contact: [place]  Contact Number: [phone] [name] 04/11/2020)  Contact Fax Number: [phone] ([place] 04/11/2020)  Prescriber Name: Unknown [name] 05/22/2020)  Prescriber Address (if known): Unknown ([employer] 05/22/2020)   What Is the Reason for Your Visit/Call Today? suicidal, overdosed on Neurontin in a suicide attempt.  Patient took more pills than prescribed, but not a lethal amount  How Long Has This Been Causing You Problems? > than 6 months  What Do You Feel Would Help You the Most Today? Therapy; Assessment Only; Medication   Have You Recently Been in Any Inpatient Treatment (Hospital/Detox/Crisis Center/28-Day Program)? Yes  Name/Location of Program/Hospital:[facility]  How Long Were You There? 5 days  When Were You Discharged? 05/04/2020   Have You Ever Received Services From [employer] Before? Yes  Who Do You See at [facility]? [facility] inpatient treatment   Have You Recently Had Any Thoughts About Hurting Yourself? Yes  Are You Planning to Commit Suicide/Harm Yourself At This time? Yes   Have you Recently Had Thoughts About Hurting Someone [name]? No  Explanation: No data recorded  Have You Used Any Alcohol or Drugs in the Past 24 Hours? No  How Long Ago Did You Use Drugs or Alcohol? No data recorded What Did You Use and How  Much? No data recorded  Do You Currently Have a Therapist/Psychiatrist? Yes  Name of Therapist/Psychiatrist: ACTT   Have You Been Recently Discharged From Any Office Practice or Programs? No  Explanation of Discharge From Practice/Program: No data recorded    CCA Screening Triage Referral  Assessment Type of Contact: Face-to-Face  Is this Initial or Reassessment? Initial Assessment  Date Telepsych consult ordered in [facility]:  05/31/2020  Time Telepsych consult ordered in [facility]:  1430   Patient Reported Information Reviewed? Yes  Patient Left Without Being Seen? No data recorded Reason for Not Completing Assessment: No data recorded  Collateral Involvement: none available   Does Patient Have a Court Appointed Legal Guardian? No data recorded Name and Contact of Legal Guardian: Self.   If Minor and Not Living with Parent(s), Who has Custody? Self  Is CPS involved or ever been involved? Never  Is APS involved or ever been involved? Never   Patient Determined To Be At Risk for Harm To Self or Others Based on Review of Patient Reported Information or Presenting Complaint? Yes, for Self-Harm  Method: No data recorded Availability of Means: No data recorded Intent: No data recorded Notification Required: No data recorded Additional Information for Danger to Others Potential: No data recorded Additional Comments for Danger to Others Potential: No data recorded Are There Guns or Other Weapons in Your Home? No data recorded Types of Guns/Weapons: No data recorded Are These Weapons Safely Secured?                            No data recorded Who Could Verify You Are Able To Have These Secured: No data recorded Do You Have any Outstanding Charges, Pending Court Dates, Parole/Probation? No data recorded Contacted To Inform of Risk of Harm To Self or Others: Unable to Contact: (TTS or Social work will notify [facility] of patient's ED admission)   Location of Assessment: [facility]   Does Patient Present under Involuntary Commitment? No  IVC Papers Initial File Date: No data recorded  [place] of Residence: [place]   Patient Currently Receiving the Following Services: [facility]T Architect)   Determination of Need: Emergent (2  hours)   Options For Referral: Group Home     CCA Biopsychosocial Intake/Chief Complaint:  Depression and anxiety  Current Symptoms/Problems: Suicidal ideation, anger, despondency, depression, anxiety   Patient Reported Schizophrenia/Schizoaffective Diagnosis in Past: Yes   Strengths: Communication  Preferences: Pt wants to be placed in a group home --''I can't live independently''  Abilities: Patient was not able to identify any abilities that she has   Type of Services Patient Feels are Needed: Housing   Initial Clinical Notes/Concerns: Pt has numerous presentations to the ED, [place], and [place].  Pt endorsed suicidal ideation.   Mental Health Symptoms Depression:  Change in energy/activity; Hopelessness; Increase/decrease in appetite; Irritability; Sleep (too much or little); Weight gain/loss; Worthlessness; Fatigue; Tearfulness   Duration of Depressive symptoms: Greater than two weeks   Mania:  N/A   Anxiety:   N/A   Psychosis:  None   Duration of Psychotic symptoms: No data recorded  Trauma:  None   Obsessions:  None   Compulsions:  None   Inattention:  None   Hyperactivity/Impulsivity:  N/A   Oppositional/Defiant Behaviors:  None   Emotional Irregularity:  Recurrent suicidal behaviors/gestures/threats; Mood lability; Unstable self-image   Other Mood/Personality Symptoms:  NA    Mental Status Exam Appearance and  self-care  Stature:  Tall   Weight:  Overweight   Clothing:  Neat/clean; Age-appropriate   Grooming:  Normal   Cosmetic use:  Age appropriate   Posture/gait:  Normal   Motor activity:  Not Remarkable   Sensorium  Attention:  Normal   Concentration:  Normal   Orientation:  X5   Recall/memory:  Normal   Affect and Mood  Affect:  Constricted; Depressed   Mood:  Depressed; Irritable   Relating  Eye contact:  Normal   Facial expression:  Constricted   Attitude toward examiner:  Irritable; Cooperative   Thought and  Language  Speech flow: Loud; Clear and Coherent   Thought content:  Appropriate to Mood and Circumstances   Preoccupation:  None   Hallucinations:  None   Organization:  No data recorded  [employer] of Knowledge:  Average   Intelligence:  Average   Abstraction:  Normal   Judgement:  Fair   Reality Testing:  Adequate   Insight:  Fair   Decision Making:  Vacilates   Social Functioning  Social Maturity:  Isolates   Social Judgement:  Victimized   Stress  Stressors:  Housing; Relationship   Coping Ability:  Exhausted   Skill Deficits:  Decision making   Supports:  Other (Comment); Family     Religion: Religion/Spirituality Are You A Religious Person?: Yes What is Your Religious Affiliation?: Christian  Leisure/Recreation: Leisure / Recreation Do You Have Hobbies?: No  Exercise/Diet: Exercise/Diet Do You Exercise?: Yes What Type of Exercise Do You Do?: Run/Walk (patient walks to places that she needs to go) How Many Times a Week Do You Exercise?: 6-7 times a week Have You Gained or Lost A Significant Amount of Weight in the Past Six Months?: No Do You Follow a Special Diet?: No Do You Have Any Trouble Sleeping?: Yes Explanation of Sleeping Difficulties: insomnia, nightmares   CCA Employment/Education Employment/Work Situation: Employment / Work Situation Employment situation: On disability Why is patient on disability: Bipolar I How long has patient been on disability: UTA Patient's job has been impacted by current illness: No What is the longest time patient has a held a job?: UTA Where was the patient employed at that time?: [employer] Has patient ever been in the [employer]?: No  Education: Education Is Patient Currently Attending School?: No Last Grade Completed:  (not assessed) Name of High School: not assessed Did Teacher, adult education From [employer]?: No (not assessed) Did You Attend College?: No Did You Attend Graduate School?:  (not  assessed) Did You Have Any Special Interests In School?: not assessed Did You Have An Individualized Education Program (IIEP): No Did You Have Any Difficulty At School?: No   CCA Family/Childhood History Family and Relationship History: Family history Marital status: Single Are you sexually active?: Yes What is your sexual orientation?: heterosexual Has your sexual activity been affected by drugs, alcohol, medication, or emotional stress?: Client denies.  Does patient have children?: Yes How many children?: 3 How is patient's relationship with their children?: Three daughters, ages 40, 70, 41, who are in foster care and adoption process  Childhood History:  Childhood History By whom was/is the patient raised?: Mother Additional childhood history information: Patient reports "I only seen him (father) about three times in my life and now he don't want nothing to do with me." Description of patient's relationship with caregiver when they were a child: Patient reports "it was good, she raised me good".  Patient's description of current relationship with  people who raised him/her: Patient states that she still has a good relationship with her mother How were you disciplined when you got in trouble as a child/adolescent?: Patient reports "nothing". Does patient have siblings?: Yes Description of patient's current relationship with siblings: Patient reports "I have two sisters, one I get along with one of them.  The other I don't hear from that much.  Ever since she married that crazy boy." Did patient suffer any verbal/emotional/physical/sexual abuse as a child?: No Did patient suffer from severe childhood neglect?: No Has patient ever been sexually abused/assaulted/raped as an adolescent or adult?: No Was the patient ever a victim of a crime or a disaster?: No Witnessed domestic violence?: No Has patient been affected by domestic violence as an adult?: No  Child/Adolescent Assessment:      CCA Substance Use Alcohol/Drug Use: Alcohol / Drug Use Pain Medications: Please see MAR Prescriptions: Please see MAR Over the Counter: Please see MAR History of alcohol / drug use?: No history of alcohol / drug abuse Longest period of sobriety (when/how long): NA                         ASAM's:  Six Dimensions of Multidimensional Assessment  Dimension 1:  Acute Intoxication and/or Withdrawal Potential:      Dimension 2:  Biomedical Conditions and Complications:      Dimension 3:  Emotional, Behavioral, or Cognitive Conditions and Complications:     Dimension 4:  Readiness to Change:     Dimension 5:  Relapse, Continued use, or Continued Problem Potential:     Dimension 6:  Recovery/Living Environment:     ASAM Severity Score:    ASAM Recommended Level of Treatment:     Substance use Disorder (SUD)    Recommendations for Services/Supports/Treatments:    DSM5 Diagnoses: Patient Active Problem List   Diagnosis Date Noted  . Gastroesophageal reflux disease 05/04/2020  . Hyperglycemia due to type 2 diabetes mellitus ([place]) 05/04/2020  . Long term (current) use of insulin ([place]) 05/04/2020  . Migraine without aura 05/04/2020  . Morbid obesity ([place]) 05/04/2020  . 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 2 diabetes mellitus ([place]) 05/04/2020  . Prolapsed internal hemorrhoids 05/04/2020  . Vitamin D deficiency 05/04/2020  . Other symptoms and signs involving cognitive functions and awareness 05/04/2020  . Suicide attempt ([place])   . Anxiety state 03/06/2020  . Schizophrenia ([place]) 09/13/2019  . Bipolar I disorder, most recent episode depressed ([place]) 06/23/2019  . MDD (major depressive disorder) 10/10/2018  . Schizoaffective disorder, bipolar type ([place]) 09/25/2018  . Affective psychosis, bipolar ([place]) 06/13/2018  . HTN (hypertension) 05/03/2018  . Tobacco use disorder 05/03/2018  . Adjustment disorder with emotional disturbance 01/02/2018  . 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 Moderate bipolar I disorder, most recent episode depressed ([place])   . Psychosis ([place])   . 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 OCD (obsessive compulsive disorder) 10/05/2016  . Major depressive disorder, recurrent episode, mild ([place]) 05/04/2016  . 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 Learning disability 07/18/2015  . 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 Diabetes mellitus ([place]) 07/18/2015  . MDD (major depressive disorder), recurrent, severe, with psychosis ([place]) 07/18/2015  . Hyperlipidemia 07/18/2015  . Severe episode of recurrent major depressive disorder, without psychotic features ([place])   . Suicidal ideation   . Drug overdose   . Cognitive deficits 10/12/2012  . Generalized anxiety disorder  06/28/2012    Disposition: Per [name], NP, patient is psychiatrically cleared and recommended for discharge.   [place], [facility]

## 2020-05-31 NOTE — ED Provider Notes (Signed)
Behavioral Health Urgent Care Medical Screening Exam  Patient Name: [name] MRN: [id number] Date of Evaluation: 05/31/20 Chief Complaint: Chief Complaint/Presenting Problem: Depression and anxiety Diagnosis:  Final diagnoses:  Adjustment disorder with mixed anxiety and depressed mood     History of Present illness: [name] is a 30 y.o. female.  Patient presents to the [facility] voluntarily via law enforcement at first reporting having suicidal ideations.  Patient continues to report that she has been coming here from every other day because she is tired of where she lives that and she feels that she needs to have medication changes.  After a very lengthy conversation it is still determined that the patient is tired of staying at her current apartment and is lonely.  I have been in contact with [name] with [facility] and has agreed to start the patient on Cymbalta 60 mg p.o. daily.  Discussed this with patient and she would like to start it today.  Patient is started on Cymbalta 60 mg p.o. daily given 1 dose prior to discharge and is also provided with 4-day samples to give [name] time to give her a prescription.  Patient continues to report that she still wants to go to a group home and that she wants to be somewhere where she has people to hang around with and talk to.  She states that she cannot return to her mom's house because her mom would not allow her to be there.  She continues to report that she does not like her apartment, however, her apartment is paid for by [facility] and she does not want to pay for a place out of her own pocket.  She also reports that her mom is her payee and she wants to keep her mom as her payee because her mom will be scanned her her check every month of $841.  After discussing with patient the plan to start medication she is also requesting a list of group homes in the [place] area because she would like to be back in a group home because she  likes the company.  Patient is provided with a list of group homes in the [place] area for her to contact herself.  Patient states that she feels safe discharging home with medications.  Her act team has been notified of the plan and medication changes.  Patient was provided with safe transport back to her residence.  Patient then denies having any suicidal or homicidal ideations and denies any hallucinations and is agreeable with going home.  Psychiatric Specialty Exam  Presentation  General Appearance:Appropriate for Environment  Eye Contact:Good  Speech:Clear and Coherent; Normal Rate  Speech Volume:Normal  Handedness:Right   Mood and Affect  Mood:Anxious  Affect:Appropriate; Congruent   Thought Process  Thought Processes:Coherent  Descriptions of Associations:Intact  Orientation:Full (Time, Place and Person)  Thought Content:WDL  Hallucinations:None  Ideas of Reference:None  Suicidal Thoughts:No With Plan; With Intent  Homicidal Thoughts:No   Sensorium  Memory:Immediate Good; Recent Good; Remote Good  Judgment:Fair  Insight:Fair   Executive Functions  Concentration:Fair  Attention Span:Good  [place]  Language:Good   Psychomotor Activity  Psychomotor Activity:Normal   Assets  Assets:Communication Skills; Desire for Improvement; Financial Resources/Insurance; Housing; Resilience; Social Support   Sleep  Sleep:Fair  Number of hours: 5   Physical Exam: Physical Exam Vitals and nursing note reviewed.  Constitutional:      Appearance: She is well-developed. She is obese.  HENT:  Head: Normocephalic.  Eyes:     Pupils: Pupils are equal, round, and reactive to light.  Cardiovascular:     Rate and Rhythm: Normal rate.  Pulmonary:     Effort: Pulmonary effort is normal.  Musculoskeletal:        General: Normal range of motion.  Neurological:     Mental Status: She is alert and oriented to person,  place, and time.    Review of Systems  Constitutional: Negative.   HENT: Negative.   Eyes: Negative.   Respiratory: Negative.   Cardiovascular: Negative.   Gastrointestinal: Negative.   Genitourinary: Negative.   Musculoskeletal: Negative.   Skin: Negative.   Neurological: Negative.   Endo/Heme/Allergies: Negative.   Psychiatric/Behavioral: Positive for depression. The patient is nervous/anxious.    Last menstrual period 05/04/2020. There is no height or weight on file to calculate BMI.  Musculoskeletal: Strength & Muscle Tone: within normal limits Gait & Station: normal Patient leans: N/A   [place] [facility]SE Discharge Disposition for Follow up and Recommendations: Based on my evaluation the patient does not appear to have an emergency medical condition and can be discharged with resources and follow up care in outpatient services for Medication Management, Individual Therapy and [facility], start Cymbalta 60 mg PO Daily   Lewis Shock, FNP 05/31/2020, 3:32 PM

## 2020-06-04 ENCOUNTER — Encounter (HOSPITAL_COMMUNITY): Payer: Self-pay | Admitting: *Deleted

## 2020-06-04 ENCOUNTER — Other Ambulatory Visit: Payer: Self-pay

## 2020-06-04 ENCOUNTER — Ambulatory Visit (HOSPITAL_COMMUNITY)
Admission: EM | Admit: 2020-06-04 | Discharge: 2020-06-04 | Disposition: A | Discharge status: Other Acute Inpatient Hospital | Payer: Medicaid Other | Attending: Psychiatry | Admitting: Psychiatry

## 2020-06-04 ENCOUNTER — Emergency Department (HOSPITAL_COMMUNITY)
Admission: EM | Admit: 2020-06-04 | Discharge: 2020-06-05 | Disposition: A | Discharge status: Home / Self Care | Payer: Medicaid Other | Attending: Emergency Medicine | Admitting: Emergency Medicine

## 2020-06-04 DIAGNOSIS — F329 Major depressive disorder, single episode, unspecified: Secondary | ICD-10-CM | POA: Insufficient documentation

## 2020-06-04 DIAGNOSIS — F3130 Bipolar disorder, current episode depressed, mild or moderate severity, unspecified: Secondary | ICD-10-CM | POA: Diagnosis present

## 2020-06-04 DIAGNOSIS — Z79899 Other long term (current) drug therapy: Secondary | ICD-10-CM | POA: Insufficient documentation

## 2020-06-04 DIAGNOSIS — F419 Anxiety disorder, unspecified: Secondary | ICD-10-CM | POA: Diagnosis not present

## 2020-06-04 DIAGNOSIS — Z7984 Long term (current) use of oral hypoglycemic drugs: Secondary | ICD-10-CM | POA: Insufficient documentation

## 2020-06-04 DIAGNOSIS — R45851 Suicidal ideations: Secondary | ICD-10-CM | POA: Insufficient documentation

## 2020-06-04 DIAGNOSIS — Z1330 Encounter for screening examination for mental health and behavioral disorders, unspecified: Secondary | ICD-10-CM | POA: Diagnosis present

## 2020-06-04 DIAGNOSIS — Z765 Malingerer [conscious simulation]: Secondary | ICD-10-CM | POA: Diagnosis not present

## 2020-06-04 DIAGNOSIS — Z794 Long term (current) use of insulin: Secondary | ICD-10-CM | POA: Diagnosis not present

## 2020-06-04 DIAGNOSIS — F17290 Nicotine dependence, other tobacco product, uncomplicated: Secondary | ICD-10-CM | POA: Insufficient documentation

## 2020-06-04 DIAGNOSIS — E119 Type 2 diabetes mellitus without complications: Secondary | ICD-10-CM | POA: Diagnosis not present

## 2020-06-04 DIAGNOSIS — F4329 Adjustment disorder with other symptoms: Secondary | ICD-10-CM

## 2020-06-04 DIAGNOSIS — R4183 Borderline intellectual functioning: Secondary | ICD-10-CM

## 2020-06-04 DIAGNOSIS — I10 Essential (primary) hypertension: Secondary | ICD-10-CM | POA: Insufficient documentation

## 2020-06-04 DIAGNOSIS — U071 COVID-19: Secondary | ICD-10-CM | POA: Diagnosis not present

## 2020-06-04 LAB — CBC
HCT: 37.8 % (ref 36.0–46.0)
Hemoglobin: 12.2 g/dL (ref 12.0–15.0)
MCH: 25.1 pg — ABNORMAL LOW (ref 26.0–34.0)
MCHC: 32.3 g/dL (ref 30.0–36.0)
MCV: 77.6 fL — ABNORMAL LOW (ref 80.0–100.0)
Platelets: 296 10*3/uL (ref 150–400)
RBC: 4.87 MIL/uL (ref 3.87–5.11)
RDW: 16.1 % — ABNORMAL HIGH (ref 11.5–15.5)
WBC: 12 10*3/uL — ABNORMAL HIGH (ref 4.0–10.5)
nRBC: 0 % (ref 0.0–0.2)

## 2020-06-04 LAB — POCT URINE DRUG SCREEN - MANUAL ENTRY (I-SCREEN)
POC Amphetamine UR: NOT DETECTED
POC Buprenorphine (BUP): NOT DETECTED
POC Cocaine UR: NOT DETECTED
POC Marijuana UR: NOT DETECTED
POC Methadone UR: NOT DETECTED
POC Methamphetamine UR: NOT DETECTED
POC Morphine: NOT DETECTED
POC Oxazepam (BZO): NOT DETECTED
POC Oxycodone UR: NOT DETECTED
POC Secobarbital (BAR): NOT DETECTED

## 2020-06-04 LAB — ETHANOL: Alcohol, Ethyl (B): <10 mg/dL (ref <10)

## 2020-06-04 LAB — COMPREHENSIVE METABOLIC PANEL
ALT: 26 U/L (ref 0–44)
AST: 30 U/L (ref 15–41)
Albumin: 4 g/dL (ref 3.5–5.0)
Alkaline Phosphatase: 41 U/L (ref 38–126)
Anion gap: 11 (ref 5–15)
BUN: 7 mg/dL (ref 6–20)
CO2: 24 mmol/L (ref 22–32)
Calcium: 9.4 mg/dL (ref 8.9–10.3)
Chloride: 105 mmol/L (ref 98–111)
Creatinine, Ser: 0.8 mg/dL (ref 0.44–1.00)
GFR, Estimated: >60 mL/min (ref >60)
Glucose, Bld: 89 mg/dL (ref 70–99)
Potassium: 3.6 mmol/L (ref 3.5–5.1)
Sodium: 140 mmol/L (ref 135–145)
Total Bilirubin: 0.9 mg/dL (ref 0.3–1.2)
Total Protein: 7.3 g/dL (ref 6.5–8.1)

## 2020-06-04 LAB — I-STAT BETA HCG BLOOD, ED (MC, WL, AP ONLY): I-stat hCG, quantitative: <5 m[IU]/mL (ref <5)

## 2020-06-04 LAB — RESP PANEL BY RT-PCR (FLU A&B, COVID) ARPGX2
Influenza A by PCR: NEGATIVE
Influenza B by PCR: NEGATIVE
SARS Coronavirus 2 by RT PCR: POSITIVE — AB

## 2020-06-04 LAB — POC SARS CORONAVIRUS 2 AG -  ED: SARS Coronavirus 2 Ag: NEGATIVE

## 2020-06-04 LAB — SALICYLATE LEVEL: Salicylate Lvl: <7 mg/dL — ABNORMAL LOW (ref 7.0–30.0)

## 2020-06-04 LAB — ACETAMINOPHEN LEVEL: Acetaminophen (Tylenol), Serum: <10 ug/mL — ABNORMAL LOW (ref 10–30)

## 2020-06-04 MED ORDER — VITAMIN D3 25 MCG (1000 UNIT) PO TABS: 1000.0000 [IU] | ORAL_TABLET | Freq: Every day | ORAL | Status: DC

## 2020-06-04 MED ORDER — ACETAMINOPHEN 325 MG PO TABS: 650.0000 mg | ORAL_TABLET | Freq: Four times a day (QID) | ORAL | Status: DC | PRN

## 2020-06-04 MED ORDER — PANTOPRAZOLE SODIUM 40 MG PO TBEC
40.0000 mg | DELAYED_RELEASE_TABLET | Freq: Every day | ORAL | Status: DC
  Filled 2020-06-04: qty 1

## 2020-06-04 MED ORDER — HYDROXYZINE HCL 25 MG PO TABS: 25.0000 mg | ORAL_TABLET | Freq: Three times a day (TID) | ORAL | Status: DC | PRN

## 2020-06-04 MED ORDER — MAGNESIUM HYDROXIDE 400 MG/5ML PO SUSP: 30.0000 mL | Freq: Every day | ORAL | Status: DC | PRN

## 2020-06-04 MED ORDER — ONDANSETRON 4 MG PO TBDP: 4.0000 mg | ORAL_TABLET | Freq: Three times a day (TID) | ORAL | Status: DC | PRN

## 2020-06-04 MED ORDER — SIMVASTATIN 20 MG PO TABS: 20.0000 mg | ORAL_TABLET | Freq: Every day | ORAL | Status: DC

## 2020-06-04 MED ORDER — ALUM & MAG HYDROXIDE-SIMETH 200-200-20 MG/5ML PO SUSP: 30.0000 mL | ORAL | Status: DC | PRN

## 2020-06-04 MED ORDER — VITAMIN B-12 1000 MCG PO TABS: 1000.0000 ug | ORAL_TABLET | Freq: Every day | ORAL | Status: DC

## 2020-06-04 MED ORDER — INSULIN DETEMIR 100 UNIT/ML ~~LOC~~ SOLN: 40.0000 [IU] | Freq: Two times a day (BID) | SUBCUTANEOUS | Status: DC

## 2020-06-04 MED ORDER — GLIPIZIDE ER 2.5 MG PO TB24
10.0000 mg | ORAL_TABLET | Freq: Once | ORAL | Status: DC
  Filled 2020-06-04: qty 4

## 2020-06-04 MED ORDER — DULOXETINE HCL 60 MG PO CPEP
60.0000 mg | ORAL_CAPSULE | Freq: Every day | ORAL | Status: DC
  Filled 2020-06-04: qty 1

## 2020-06-04 MED ORDER — LITHIUM CARBONATE ER 300 MG PO TBCR
600.0000 mg | EXTENDED_RELEASE_TABLET | Freq: Two times a day (BID) | ORAL | Status: DC
Start: 2020-06-04 — End: 2020-06-04
  Filled 2020-06-04: qty 2

## 2020-06-04 MED ORDER — HALOPERIDOL 5 MG PO TABS: 10.0000 mg | ORAL_TABLET | Freq: Every day | ORAL | Status: DC

## 2020-06-04 MED ORDER — PROPRANOLOL HCL 10 MG PO TABS
10.0000 mg | ORAL_TABLET | Freq: Two times a day (BID) | ORAL | Status: DC
  Filled 2020-06-04: qty 1

## 2020-06-04 NOTE — ED Provider Notes (Signed)
FBC/OBS ASAP Discharge Summary  Date and Time: 06/04/2020 7:49 PM  Name: [name]  MRN:  [id number]   Patient to be transferred to [facility] ED spoke with Dr. Karle Starch and he is aware and accepted patient to ED.  Patient has tested positive for COVID+ awaiting infectious disease to respond related to continuous assessment being an open unit but unable to wait related to the need for available beds.    Tytionna Cloyd, NP 06/04/2020, 7:49 PM

## 2020-06-04 NOTE — [facility] Counselor (Signed)
Patient seen by TTS for triage. She is well known to [facility] and [facility] service line due to frequent encounters. Today patient reports severe anxiety that is causing her to want to kill herself. She states "I'm planning on taking a bigger overdose than last time. I've got gaba-pentin and hydroxyzine." Dr. Serafina Mitchell to see.

## 2020-06-04 NOTE — ED Provider Notes (Signed)
Behavioral Health Admission H&P [facility])  Date: 06/04/20 Patient Name: [name] MRN: [id number] Chief Complaint:  Chief Complaint  Patient presents with  . Suicidal   Chief Complaint/Presenting Problem: Depression and anxiety  Diagnoses:  Final diagnoses:  None    HPI:  [name] is a 30 y.o. female.  Patient presents to the [place] voluntarily via law enforcement at first reporting having suicidal ideations.  Patient appears extremely anxious and is irritable on interview. She states that she is suicidal and has a plan to overdose on her prescribed medications. She states that she is upset because she keeps voluntarily presenting for help but is unable to get the help that she needs which she identifies as "admission for 5 days" and that she need to get her medications adjusted. She states that "I overdosed twice last month" and states that if she is discharged from the [facility] today that she is going to intentionally overdose on medications but will take more medications in her overdose to ensure it it successful.  She is unable to name any particular stressors but when asked directly admits that her housing situation is one of her stressors and that she is waiting for her ACT team to assist her in find a [place]. She states that she has been suicidal for weeks as well as anxious for weeks. states that she saw her ACT team yesterday and that she reported increased anxiety to [name] with the ACT team but denies that any new medications were started. She admits that she did not report SI to him but was unable/unwilling to provide further information regarding how the ACT team visit went. She does not provide consent for me to speak with [name]. Patient to be admitted for overnight observation as she is unable to contract for safety at this time.      PHQ 2-9:  [place] ED from 05/26/2020 in [place] DEPT ED from 05/22/2020 in [facility] ED from 04/11/2020 in [facility]  Thoughts that you would be better off dead, or of hurting yourself in some way More than half the days Nearly every day Nearly every day  [Phreesia 04/11/2020]  PHQ-9 Total Score 18 19 15       Flowsheet Row ED from 05/31/2020 in [facility] ED from 05/28/2020 in [place] ED from 05/26/2020 in [place] CATEGORY High Risk High Risk High Risk       Total Time spent with patient: 30 minutes  Musculoskeletal  Strength & Muscle Tone: within normal limits Gait & Station: normal Patient leans: N/A  Psychiatric Specialty Exam  Presentation General Appearance: Appropriate for Environment; Casual  Eye Contact:Fair; Other (comment) (intense)  Speech:Clear and Coherent; Normal Rate (angry tone of voice)  Speech Volume:Increased  Handedness:Right   Mood and Affect  Mood:Irritable; Anxious  Affect:Labile; Other (comment); Congruent (anxious)   Thought Process  Thought Processes:Coherent  Descriptions of Associations:Intact  Orientation:Full (Time, Place and Person)  Thought Content:WDL  Hallucinations:Hallucinations: None  Ideas of Reference:None  Suicidal Thoughts:Suicidal Thoughts: Yes, Active (plan to overdose on home medications) SI Active Intent and/or Plan: With Intent; With Plan; With Access to Means; With Means to Carry Out  Homicidal Thoughts:Homicidal Thoughts: No   Sensorium  Memory:Immediate Good; Recent Good; Remote Fair  Judgment:Impaired  Insight:Fair   Executive Functions  Concentration:Fair  Attention Span:Fair  [place]   Psychomotor Activity  Psychomotor Activity:Psychomotor Activity: Other (comment); Restlessness (constantly shaking leg up and down, difficulty staying still)   Assets   Assets:Communication Skills; Desire for Improvement   Sleep  Sleep:Sleep: Fair   Physical Exam Constitutional:      Appearance: Normal appearance. She is obese.  HENT:     Head: Normocephalic and atraumatic.  Pulmonary:     Effort: Pulmonary effort is normal.  Neurological:     Mental Status: She is alert.    Review of Systems  Constitutional: Negative for chills and fever.  Eyes: Negative for discharge and redness.  Respiratory: Negative for cough.   Cardiovascular: Negative for chest pain.  Neurological: Negative for headaches.  Psychiatric/Behavioral: Positive for depression and suicidal ideas. Negative for hallucinations. The patient is nervous/anxious.     Blood pressure 118/76, pulse 73, temperature 98.5 F (36.9 C), temperature source Temporal, resp. rate 18, SpO2 99 %. There is no height or weight on file to calculate BMI.  Past Psychiatric History: schizoaffective disorder , biplar diosrder  Is the patient at risk to self? Yes  Has the patient been a risk to self in the past 6 months? Yes .    Has the patient been a risk to self within the distant past? Yes   Is the patient a risk to others? No   Has the patient been a risk to others in the past 6 months? No   Has the patient been a risk to others within the distant past? No   Past Medical History:  Past Medical History:  Diagnosis Date  . Anxiety   . Bipolar 1 disorder ([place])   . Cognitive deficits   . Depression   . Diabetes mellitus without complication ([place])   . Hypertension   . Mental disorder   . Mental health disorder   . Obesity     Past Surgical History:  Procedure Laterality Date  . CESAREAN SECTION    . CESAREAN SECTION N/A 04/25/2013   Procedure: REPEAT CESAREAN SECTION;  Surgeon: [name], MD;  Location: [facility];  Service: Obstetrics;  Laterality: N/A;  . MASS EXCISION N/A 06/03/2012   Procedure: EXCISION MASS;  Surgeon: [name], MD;  Location: [place];   Service: ENT;  Laterality: N/A;  Excision uvula mass  . TONSILLECTOMY N/A 06/03/2012   Procedure: TONSILLECTOMY;  Surgeon: [name], MD;  Location: [facility] [facility];  Service: ENT;  Laterality: N/A;  . TONSILLECTOMY      Family History:  Family History  Problem Relation Age of Onset  . Hypertension Mother   . Diabetes Father     Social History:  Social History   Socioeconomic History  . Marital status: Single    Spouse name: Not on file  . Number of children: Not on file  . Years of education: Not on file  . Highest education level: Not on file  Occupational History  . Not on file  Tobacco Use  . Smoking status: Current Every Day Smoker    Packs/day: 4.00    Years: 11.00    Pack years: 44.00    Types: Cigarettes, Cigars  . Smokeless tobacco: Never Used  . Tobacco comment: Pt declined  Vaping Use  . Vaping Use: Never used  Substance and Sexual Activity  . Alcohol use: Not Currently    Comment: occ: last intake 4 mts ago  . Drug use: Not Currently    Types: "  Crack" cocaine, Other-see comments    Comment: Patient reports hx of smoking Crack  . Sexual activity: Yes    Birth control/protection: Implant  Other Topics Concern  . Not on file  Social History Narrative   ** Merged History Encounter **       Social Determinants of Health   Financial Resource Strain: Not on file  Food Insecurity: Not on file  Transportation Needs: Not on file  Physical Activity: Not on file  Stress: Not on file  Social Connections: Not on file  Intimate Partner Violence: Not on file    SDOH:  SDOH Screenings   Alcohol Screen: Low Risk   . Last Alcohol Screening Score (AUDIT): 3  Depression (PHQ2-9): Medium Risk  . PHQ-2 Score: 18  Financial Resource Strain: Not on file  Food Insecurity: Not on file  Housing: Not on file  Physical Activity: Not on file  Social Connections: Not on file  Stress: Not on file  Tobacco Use: High Risk  . Smoking Tobacco Use: Current  Every Day Smoker  . Smokeless Tobacco Use: Never Used  Transportation Needs: Not on file    Last Labs:  Admission on 05/28/2020, Discharged on 05/28/2020  Component Date Value Ref Range Status  . Sodium 05/28/2020 138  135 - 145 mmol/L Final  . Potassium 05/28/2020 3.9  3.5 - 5.1 mmol/L Final  . Chloride 05/28/2020 105  98 - 111 mmol/L Final  . CO2 05/28/2020 24  22 - 32 mmol/L Final  . Glucose, Bld 05/28/2020 152* 70 - 99 mg/dL Final   Glucose reference range applies only to samples taken after fasting for at least 8 hours.  . BUN 05/28/2020 13  6 - 20 mg/dL Final  . Creatinine, Ser 05/28/2020 0.70  0.44 - 1.00 mg/dL Final  . Calcium 05/28/2020 9.3  8.9 - 10.3 mg/dL Final  . Total Protein 05/28/2020 6.5  6.5 - 8.1 g/dL Final  . Albumin 05/28/2020 3.4* 3.5 - 5.0 g/dL Final  . AST 05/28/2020 22  15 - 41 U/L Final  . ALT 05/28/2020 20  0 - 44 U/L Final  . Alkaline Phosphatase 05/28/2020 43  38 - 126 U/L Final  . Total Bilirubin 05/28/2020 0.7  0.3 - 1.2 mg/dL Final  . GFR, Estimated 05/28/2020 >60  >60 mL/min Final   Comment: (NOTE) Calculated using the CKD-EPI Creatinine Equation (2021)   . Anion gap 05/28/2020 9  5 - 15 Final   Performed at [facility], [name] [address], [place], [place] [address]  . Alcohol, Ethyl (B) 05/28/2020 <10  <10 mg/dL Final   Comment: (NOTE) Lowest detectable limit for serum alcohol is 10 mg/dL.  For medical purposes only. Performed at [facility], [place] [address] [place], [place] [address]   . Salicylate Lvl 31/51/7616 <7.0* 7.0 - 30.0 mg/dL Final   Performed at [place] [address], [place], [place] [address]  . Acetaminophen (Tylenol), Serum 05/28/2020 <10* 10 - 30 ug/mL Final   Comment: (NOTE) Therapeutic concentrations vary significantly. A range of 10-30 ug/mL  may be an effective concentration for many patients. However, some  are best treated at concentrations outside of this range. Acetaminophen concentrations  >150 ug/mL at 4 hours after ingestion  and >50 ug/mL at 12 hours after ingestion are often associated with  toxic reactions.  Performed at [facility], [place] [address] [place],  [address]   . WBC 05/28/2020 10.4  4.0 - 10.5  K/uL Final  . RBC 05/28/2020 4.62  3.87 - 5.11 MIL/uL Final  . Hemoglobin 05/28/2020 11.6* 12.0 - 15.0 g/dL Final  . HCT 05/28/2020 35.8* 36.0 - 46.0 % Final  . MCV 05/28/2020 77.5* 80.0 - 100.0 fL Final  . MCH 05/28/2020 25.1* 26.0 - 34.0 pg Final  . MCHC 05/28/2020 32.4  30.0 - 36.0 g/dL Final  . RDW 05/28/2020 15.6* 11.5 - 15.5 % Final  . Platelets 05/28/2020 270  150 - 400 K/uL Final  . nRBC 05/28/2020 0.0  0.0 - 0.2 % Final   Performed at [facility], [place] [address], [place], [place] [address]  . Glucose-Capillary 05/28/2020 149* 70 - 99 mg/dL Final   Glucose reference range applies only to samples taken after fasting for at least 8 hours.  . Comment 1 05/28/2020 Notify RN   Final  . Comment 2 05/28/2020 Document in Chart   Final  . I-stat hCG, quantitative 05/28/2020 <5.0  <5 mIU/mL Final  . Comment 3 05/28/2020          Final   Comment:   GEST. AGE      CONC.  (mIU/mL)   <=1 WEEK        5 - 50     2 WEEKS       50 - 500     3 WEEKS       100 - 10,000     4 WEEKS     1,000 - 30,000        FEMALE AND NON-PREGNANT FEMALE:     LESS THAN 5 mIU/mL   Admission on 05/26/2020, Discharged on 05/26/2020  Component Date Value Ref Range Status  . Sodium 05/26/2020 139  135 - 145 mmol/L Final  . Potassium 05/26/2020 4.2  3.5 - 5.1 mmol/L Final  . Chloride 05/26/2020 106  98 - 111 mmol/L Final  . CO2 05/26/2020 25  22 - 32 mmol/L Final  . Glucose, Bld 05/26/2020 218* 70 - 99 mg/dL Final   Glucose reference range applies only to samples taken after fasting for at least 8 hours.  . BUN 05/26/2020 8  6 - 20 mg/dL Final  . Creatinine, Ser 05/26/2020 0.81  0.44 - 1.00 mg/dL Final  . Calcium 05/26/2020 9.1  8.9 - 10.3 mg/dL Final  . Total Protein  05/26/2020 7.4  6.5 - 8.1 g/dL Final  . Albumin 05/26/2020 3.9  3.5 - 5.0 g/dL Final  . AST 05/26/2020 25  15 - 41 U/L Final  . ALT 05/26/2020 17  0 - 44 U/L Final  . Alkaline Phosphatase 05/26/2020 46  38 - 126 U/L Final  . Total Bilirubin 05/26/2020 0.5  0.3 - 1.2 mg/dL Final  . GFR, Estimated 05/26/2020 >60  >60 mL/min Final   Comment: (NOTE) Calculated using the CKD-EPI Creatinine Equation (2021)   . Anion gap 05/26/2020 8  5 - 15 Final   Performed at [facility], [place] [name] [place], [place] [address]  . Alcohol, Ethyl (B) 05/26/2020 <10  <10 mg/dL Final   Comment: (NOTE) Lowest detectable limit for serum alcohol is 10 mg/dL.  For medical purposes only. Performed at [facility], [place] [address], [place],  [address]   . Opiates 05/26/2020 NONE DETECTED  NONE DETECTED Final  . Cocaine 05/26/2020 NONE DETECTED  NONE DETECTED Final  . Benzodiazepines 05/26/2020 NONE DETECTED  NONE DETECTED Final  . Amphetamines 05/26/2020 NONE DETECTED  NONE DETECTED Final  .  Tetrahydrocannabinol 05/26/2020 NONE DETECTED  NONE DETECTED Final  . Barbiturates 05/26/2020 NONE DETECTED  NONE DETECTED Final   Comment: (NOTE) DRUG SCREEN FOR MEDICAL PURPOSES ONLY.  IF CONFIRMATION IS NEEDED FOR ANY PURPOSE, NOTIFY LAB WITHIN 5 DAYS.  LOWEST DETECTABLE LIMITS FOR URINE DRUG SCREEN Drug Class                     Cutoff (ng/mL) Amphetamine and metabolites    1000 Barbiturate and metabolites    200 Benzodiazepine                 810 Tricyclics and metabolites     300 Opiates and metabolites        300 Cocaine and metabolites        300 THC                            50 Performed at [facility], [place] [address] [place], [place] [address]   . WBC 05/26/2020 7.0  4.0 - 10.5 K/uL Final  . RBC 05/26/2020 4.62  3.87 - 5.11 MIL/uL Final  . Hemoglobin 05/26/2020 11.7* 12.0 - 15.0 g/dL Final  . HCT 05/26/2020 36.9  36.0 - 46.0 % Final   . MCV 05/26/2020 79.9* 80.0 - 100.0 fL Final  . MCH 05/26/2020 25.3* 26.0 - 34.0 pg Final  . MCHC 05/26/2020 31.7  30.0 - 36.0 g/dL Final  . RDW 05/26/2020 15.9* 11.5 - 15.5 % Final  . Platelets 05/26/2020 270  150 - 400 K/uL Final   REPEATED TO VERIFY  . nRBC 05/26/2020 0.0  0.0 - 0.2 % Final  . Neutrophils Relative % 05/26/2020 50  % Final  . Neutro Abs 05/26/2020 3.5  1.7 - 7.7 K/uL Final  . Lymphocytes Relative 05/26/2020 35  % Final  . Lymphs Abs 05/26/2020 2.5  0.7 - 4.0 K/uL Final  . Monocytes Relative 05/26/2020 7  % Final  . Monocytes Absolute 05/26/2020 0.5  0.1 - 1.0 K/uL Final  . Eosinophils Relative 05/26/2020 7  % Final  . Eosinophils Absolute 05/26/2020 0.5  0.0 - 0.5 K/uL Final  . Basophils Relative 05/26/2020 1  % Final  . Basophils Absolute 05/26/2020 0.1  0.0 - 0.1 K/uL Final  . Immature Granulocytes 05/26/2020 0  % Final  . Abs Immature Granulocytes 05/26/2020 0.02  0.00 - 0.07 K/uL Final   Performed at [facility], [place] [address], [place],  [address]  . I-stat hCG, quantitative 05/26/2020 <5.0  <5 mIU/mL Final  . Comment 3 05/26/2020          Final   Comment:   GEST. AGE      CONC.  (mIU/mL)   <=1 WEEK        5 - 50     2 WEEKS       50 - 500     3 WEEKS       100 - 10,000     4 WEEKS     1,000 - 30,000        FEMALE AND NON-PREGNANT FEMALE:     LESS THAN 5 mIU/mL   . Acetaminophen (Tylenol), Serum 05/26/2020 <10* 10 - 30 ug/mL Final   Comment: (NOTE) Therapeutic concentrations vary significantly. A range of 10-30 ug/mL  may be an effective concentration for many patients. However, some  are best treated at concentrations outside of this range. Acetaminophen concentrations >150  ug/mL at 4 hours after ingestion  and >50 ug/mL at 12 hours after ingestion are often associated with  toxic reactions.  Performed at [facility], [place] [address], [place], [place] [address]   . Salicylate Lvl 16/04/930 <7.0* 7.0 -  30.0 mg/dL Final   Performed at [place] [address] [place], [place] [address]  . Lithium Lvl 05/26/2020 0.39* 0.60 - 1.20 mmol/L Final   Performed at [place] [address], [place], [place] [address]  . Glucose-Capillary 05/26/2020 147* 70 - 99 mg/dL Final   Glucose reference range applies only to samples taken after fasting for at least 8 hours.  Marland Kitchen SARS Coronavirus 2 05/26/2020 NEGATIVE  NEGATIVE Final   Comment: (NOTE) SARS-CoV-2 target nucleic acids are NOT DETECTED.  The SARS-CoV-2 RNA is generally detectable in upper and lower respiratory specimens during the acute phase of infection. The lowest concentration of SARS-CoV-2 viral copies this assay can detect is 250 copies / mL. A negative result does not preclude SARS-CoV-2 infection and should not be used as the sole basis for treatment or other patient management decisions.  A negative result may occur with improper specimen collection / handling, submission of specimen other than nasopharyngeal swab, presence of viral mutation(s) within the areas targeted by this assay, and inadequate number of viral copies (<250 copies / mL). A negative result must be combined with clinical observations, patient history, and epidemiological information.  Fact Sheet for Patients:   StrictlyIdeas.no  Fact Sheet for Healthcare Providers: BankingDealers.co.za  This test is not yet approved or                           cleared by the [place] FDA and has been authorized for detection and/or diagnosis of SARS-CoV-2 by FDA under an Emergency Use Authorization (EUA).  This EUA will remain in effect (meaning this test can be used) for the duration of the COVID-19 declaration under Section 564(b)(1) of the Act, 21 U.S.C. section 360bbb-3(b)(1), unless the authorization is terminated or revoked sooner.  Performed at [facility], [place] [address], [place], [place] [address]   Admission on 05/20/2020, Discharged on 05/20/2020  Component Date Value Ref Range Status  . I-stat hCG, quantitative 05/20/2020 <5.0  <5 mIU/mL Final  . Comment 3 05/20/2020          Final   Comment:   GEST. AGE      CONC.  (mIU/mL)   <=1 WEEK        5 - 50     2 WEEKS       50 - 500     3 WEEKS       100 - 10,000     4 WEEKS     1,000 - 30,000        FEMALE AND NON-PREGNANT FEMALE:     LESS THAN 5 mIU/mL   . Neisseria Gonorrhea 05/20/2020 Negative   Final  . Chlamydia 05/20/2020 Negative   Final  . Comment 05/20/2020 Normal Reference [place] Chlamydia - Negative   Final  . Comment 05/20/2020 Normal Reference Range Neisseria Gonorrhea - Negative   Final  . RPR Ser Ql 05/20/2020 NON REACTIVE  NON REACTIVE Final   Performed at [facility], [place] [address], [place], [place] [address]  . HIV Screen 4th Generation wRfx 05/20/2020 Non Reactive  Non Reactive Final   Performed at [facility], [place] [address], [place], [place] [address]  . Yeast Wet Prep HPF POC 05/20/2020 PRESENT* NONE SEEN Final  . Trich, Wet Prep 05/20/2020 NONE SEEN  NONE SEEN Final  . Clue Cells Wet Prep HPF POC 05/20/2020 PRESENT* NONE SEEN Final  . WBC, Wet Prep HPF POC 05/20/2020 MANY* NONE SEEN Final  . Sperm 05/20/2020 NONE SEEN   Final   Performed at [facility], [place] [address] [place], [place] [address]  Admission on 05/02/2020, Discharged on 05/05/2020  Component Date Value Ref Range Status  . Glucose-Capillary 05/02/2020 160* 70 - 99 mg/dL Final   Glucose reference range applies only to samples taken after fasting for at least 8 hours.  . Hgb A1c MFr Bld 05/03/2020 5.6  4.8 - 5.6 % Final   Comment: (NOTE) Pre diabetes:          5.7%-6.4%  Diabetes:              >6.4%  Glycemic control for   <7.0% adults with diabetes   . Mean Plasma Glucose 05/03/2020 114.02  mg/dL Final   Performed at [place] [address], [place], [place] [address]  . Cholesterol 05/03/2020 150  0 - 200 mg/dL Final  . Triglycerides 05/03/2020 196* <150 mg/dL Final  . HDL 05/03/2020 37* >40 mg/dL Final  . Total CHOL/HDL Ratio 05/03/2020 4.1  RATIO Final  . VLDL 05/03/2020 39  0 - 40 mg/dL Final  . LDL Cholesterol 05/03/2020 74  0 - 99 mg/dL Final   Comment:        Total Cholesterol/HDL:CHD Risk Coronary Heart Disease Risk Table                     Men   Women  1/2 Average Risk   3.4   3.3  Average Risk       5.0   4.4  2 X Average Risk   9.6   7.1  3 X Average Risk  23.4   11.0        Use the calculated Patient Ratio above and the CHD Risk Table to determine the patient's CHD Risk.        ATP III CLASSIFICATION (LDL):  <100     mg/dL   Optimal  100-129  mg/dL   Near or Above                    Optimal  130-159  mg/dL   Borderline  160-189  mg/dL   High  >190     mg/dL   Very High Performed at [place] [address] [place], [place] [address]   . TSH 05/03/2020 1.052  0.350 - 4.500 uIU/mL Final   Comment: Performed by a 3rd Generation assay with a functional sensitivity of <=0.01 uIU/mL. Performed at [facility], [place] [address], [place], [place] [address]   . Prolactin 05/03/2020 32.1* 4.8 - 23.3 ng/mL Final   Comment: (NOTE) Performed At: [facility] [place], [place] [id number] Rush Farmer MD SA:[id number]   . Lithium Lvl 05/03/2020 0.47* 0.60 - 1.20 mmol/L Final   Performed at [place] [address] [place],  [address]  . Glucose-Capillary 05/03/2020 145* 70 - 99 mg/dL Final   Glucose reference range applies only to samples taken after fasting for at least 8 hours.  . Glucose-Capillary 05/04/2020 132* 70 - 99 mg/dL Final  Glucose reference range applies only to samples taken after fasting for at least 8 hours.  Marland Kitchen SARS Coronavirus 2 by RT PCR 05/04/2020 POSITIVE* NEGATIVE Final   Comment: RESULT  CALLED TO, READ BACK BY AND VERIFIED WITH: BERRY,E. RN AT 1812 05/04/20 MULLINS,T (NOTE) SARS-CoV-2 target nucleic acids are DETECTED.  The SARS-CoV-2 RNA is generally detectable in upper respiratory specimens during the acute phase of infection. Positive results are indicative of the presence of the identified virus, but do not rule out bacterial infection or co-infection with other pathogens not detected by the test. Clinical correlation with patient history and other diagnostic information is necessary to determine patient infection status. The expected result is Negative.  Fact Sheet for Patients: EntrepreneurPulse.com.au  Fact Sheet for Healthcare Providers: IncredibleEmployment.be  This test is not yet approved or cleared by the [place] FDA and  has been authorized for detection and/or diagnosis of SARS-CoV-2 by FDA under an Emergency Use Authorization (EUA).  This EUA will remain in effect (meaning this test c                          an be used) for the duration of  the COVID-19 declaration under Section 564(b)(1) of the Act, 21 U.S.C. section 360bbb-3(b)(1), unless the authorization is terminated or revoked sooner.    . Influenza A by PCR 05/04/2020 NEGATIVE  NEGATIVE Final  . Influenza B by PCR 05/04/2020 NEGATIVE  NEGATIVE Final   Comment: (NOTE) The Xpert Xpress SARS-CoV-2/FLU/RSV plus assay is intended as an aid in the diagnosis of influenza from Nasopharyngeal swab specimens and should not be used as a sole basis for treatment. Nasal washings and aspirates are unacceptable for Xpert Xpress SARS-CoV-2/FLU/RSV testing.  Fact Sheet for Patients: EntrepreneurPulse.com.au  Fact Sheet for Healthcare Providers: IncredibleEmployment.be  This test is not yet approved or cleared by the [place] FDA and has been authorized for detection and/or diagnosis of SARS-CoV-2 by FDA under an  Emergency Use Authorization (EUA). This EUA will remain in effect (meaning this test can be used) for the duration of the COVID-19 declaration under Section 564(b)(1) of the Act, 21 U.S.C. section 360bbb-3(b)(1), unless the authorization is terminated or revoked.    Marland Kitchen Resp Syncytial Virus by PCR 05/04/2020 NEGATIVE  NEGATIVE Final   Comment: (NOTE) Fact Sheet for Patients: EntrepreneurPulse.com.au  Fact Sheet for Healthcare Providers: IncredibleEmployment.be  This test is not yet approved or cleared by the [place] FDA and has been authorized for detection and/or diagnosis of SARS-CoV-2 by FDA under an Emergency Use Authorization (EUA). This EUA will remain in effect (meaning this test can be used) for the duration of the COVID-19 declaration under Section 564(b)(1) of the Act, 21 U.S.C. section 360bbb-3(b)(1), unless the authorization is terminated or revoked.  Performed at [facility], [place] [address] [place], [place] [address]   . Glucose-Capillary 05/04/2020 146* 70 - 99 mg/dL Final   Glucose reference range applies only to samples taken after fasting for at least 8 hours.  . WBC 05/04/2020 8.2  4.0 - 10.5 K/uL Final  . RBC 05/04/2020 4.78  3.87 - 5.11 MIL/uL Final  . Hemoglobin 05/04/2020 12.3  12.0 - 15.0 g/dL Final  . HCT 05/04/2020 40.1  36.0 - 46.0 % Final  . MCV 05/04/2020 83.9  80.0 - 100.0 fL Final  . MCH 05/04/2020 25.7* 26.0 - 34.0 pg Final  . MCHC 05/04/2020 30.7  30.0 - 36.0 g/dL Final  .  RDW 05/04/2020 15.6* 11.5 - 15.5 % Final  . Platelets 05/04/2020 250  150 - 400 K/uL Final  . nRBC 05/04/2020 0.0  0.0 - 0.2 % Final  . Neutrophils Relative % 05/04/2020 74  % Final  . Neutro Abs 05/04/2020 6.1  1.7 - 7.7 K/uL Final  . Lymphocytes Relative 05/04/2020 11  % Final  . Lymphs Abs 05/04/2020 0.9  0.7 - 4.0 K/uL Final  . Monocytes Relative 05/04/2020 12  % Final  . Monocytes Absolute 05/04/2020 1.0  0.1  - 1.0 K/uL Final  . Eosinophils Relative 05/04/2020 2  % Final  . Eosinophils Absolute 05/04/2020 0.2  0.0 - 0.5 K/uL Final  . Basophils Relative 05/04/2020 1  % Final  . Basophils Absolute 05/04/2020 0.0  0.0 - 0.1 K/uL Final  . Immature Granulocytes 05/04/2020 0  % Final  . Abs Immature Granulocytes 05/04/2020 0.02  0.00 - 0.07 K/uL Final   Performed at [facility], [place] [address] [place], [place] [address]  . Sodium 05/04/2020 140  135 - 145 mmol/L Final  . Potassium 05/04/2020 4.4  3.5 - 5.1 mmol/L Final  . Chloride 05/04/2020 105  98 - 111 mmol/L Final  . CO2 05/04/2020 26  22 - 32 mmol/L Final  . Glucose, Bld 05/04/2020 109* 70 - 99 mg/dL Final   Glucose reference range applies only to samples taken after fasting for at least 8 hours.  . BUN 05/04/2020 7  6 - 20 mg/dL Final  . Creatinine, Ser 05/04/2020 0.88  0.44 - 1.00 mg/dL Final  . Calcium 05/04/2020 9.3  8.9 - 10.3 mg/dL Final  . Total Protein 05/04/2020 7.5  6.5 - 8.1 g/dL Final  . Albumin 05/04/2020 4.2  3.5 - 5.0 g/dL Final  . AST 05/04/2020 30  15 - 41 U/L Final  . ALT 05/04/2020 29  0 - 44 U/L Final  . Alkaline Phosphatase 05/04/2020 37* 38 - 126 U/L Final  . Total Bilirubin 05/04/2020 0.3  0.3 - 1.2 mg/dL Final  . GFR, Estimated 05/04/2020 >60  >60 mL/min Final   Comment: (NOTE) Calculated using the CKD-EPI Creatinine Equation (2021)   . Anion gap 05/04/2020 9  5 - 15 Final   Performed at [facility], [place] [address], [place], [place] [address]  Admission on 04/30/2020, Discharged on 05/02/2020  Component Date Value Ref Range Status  . Lamotrigine Lvl 04/30/2020 6.0  2.0 - 20.0 ug/mL Final   Comment: (NOTE)                                Detection Limit = 1.0 Performed At: [facility] [place], [place] [id number] Rush Farmer MD PO:[id number]   . SARS Coronavirus 2 by RT PCR 04/30/2020 NEGATIVE  NEGATIVE Final   Comment: (NOTE) SARS-CoV-2 target  nucleic acids are NOT DETECTED.  The SARS-CoV-2 RNA is generally detectable in upper respiratory specimens during the acute phase of infection. The lowest concentration of SARS-CoV-2 viral copies this assay can detect is 138 copies/mL. A negative result does not preclude SARS-Cov-2 infection and should not be used as the sole basis for treatment or other patient management decisions. A negative result may occur with  improper specimen collection/handling, submission of specimen other than nasopharyngeal swab, presence of viral mutation(s) within the areas targeted by this assay, and inadequate number of viral copies(<138 copies/mL). A negative result must be combined with clinical  observations, patient history, and epidemiological information. The expected result is Negative.  Fact Sheet for Patients:  EntrepreneurPulse.com.au  Fact Sheet for Healthcare Providers:  IncredibleEmployment.be  This test is no                          t yet approved or cleared by the [place] FDA and  has been authorized for detection and/or diagnosis of SARS-CoV-2 by FDA under an Emergency Use Authorization (EUA). This EUA will remain  in effect (meaning this test can be used) for the duration of the COVID-19 declaration under Section 564(b)(1) of the Act, 21 U.S.C.section 360bbb-3(b)(1), unless the authorization is terminated  or revoked sooner.      . Influenza A by PCR 04/30/2020 NEGATIVE  NEGATIVE Final  . Influenza B by PCR 04/30/2020 NEGATIVE  NEGATIVE Final   Comment: (NOTE) The Xpert Xpress SARS-CoV-2/FLU/RSV plus assay is intended as an aid in the diagnosis of influenza from Nasopharyngeal swab specimens and should not be used as a sole basis for treatment. Nasal washings and aspirates are unacceptable for Xpert Xpress SARS-CoV-2/FLU/RSV testing.  Fact Sheet for Patients: EntrepreneurPulse.com.au  Fact Sheet for Healthcare  Providers: IncredibleEmployment.be  This test is not yet approved or cleared by the [place] FDA and has been authorized for detection and/or diagnosis of SARS-CoV-2 by FDA under an Emergency Use Authorization (EUA). This EUA will remain in effect (meaning this test can be used) for the duration of the COVID-19 declaration under Section 564(b)(1) of the Act, 21 U.S.C. section 360bbb-3(b)(1), unless the authorization is terminated or revoked.  Performed at [facility], [place] [address] [place], [place] [address]   . Sodium 04/30/2020 142  135 - 145 mmol/L Final  . Potassium 04/30/2020 3.6  3.5 - 5.1 mmol/L Final  . Chloride 04/30/2020 109  98 - 111 mmol/L Final  . CO2 04/30/2020 24  22 - 32 mmol/L Final  . Glucose, Bld 04/30/2020 96  70 - 99 mg/dL Final   Glucose reference range applies only to samples taken after fasting for at least 8 hours.  . BUN 04/30/2020 8  6 - 20 mg/dL Final  . Creatinine, Ser 04/30/2020 0.66  0.44 - 1.00 mg/dL Final  . Calcium 04/30/2020 9.4  8.9 - 10.3 mg/dL Final  . Total Protein 04/30/2020 7.4  6.5 - 8.1 g/dL Final  . Albumin 04/30/2020 4.4  3.5 - 5.0 g/dL Final  . AST 04/30/2020 21  15 - 41 U/L Final  . ALT 04/30/2020 19  0 - 44 U/L Final  . Alkaline Phosphatase 04/30/2020 34* 38 - 126 U/L Final  . Total Bilirubin 04/30/2020 0.5  0.3 - 1.2 mg/dL Final  . GFR, Estimated 04/30/2020 >60  >60 mL/min Final   Comment: (NOTE) Calculated using the CKD-EPI Creatinine Equation (2021)   . Anion gap 04/30/2020 9  5 - 15 Final   Performed at [facility], [place] [address], [place], [place] [address]  . Alcohol, Ethyl (B) 04/30/2020 <10  <10 mg/dL Final   Comment: (NOTE) Lowest detectable limit for serum alcohol is 10 mg/dL.  For medical purposes only. Performed at [facility], [place] [address], [place], [place] [address]   . Opiates 04/30/2020 NONE DETECTED  NONE DETECTED Final  .  Cocaine 04/30/2020 NONE DETECTED  NONE DETECTED Final  . Benzodiazepines 04/30/2020 NONE DETECTED  NONE DETECTED Final  . Amphetamines 04/30/2020 NONE DETECTED  NONE DETECTED Final  .  Tetrahydrocannabinol 04/30/2020 NONE DETECTED  NONE DETECTED Final  . Barbiturates 04/30/2020 NONE DETECTED  NONE DETECTED Final   Comment: (NOTE) DRUG SCREEN FOR MEDICAL PURPOSES ONLY.  IF CONFIRMATION IS NEEDED FOR ANY PURPOSE, NOTIFY LAB WITHIN 5 DAYS.  LOWEST DETECTABLE LIMITS FOR URINE DRUG SCREEN Drug Class                     Cutoff (ng/mL) Amphetamine and metabolites    1000 Barbiturate and metabolites    200 Benzodiazepine                 086 Tricyclics and metabolites     300 Opiates and metabolites        300 Cocaine and metabolites        300 THC                            50 Performed at [facility], [place] [address], [place], [place] [address]   . WBC 04/30/2020 11.4* 4.0 - 10.5 K/uL Final  . RBC 04/30/2020 4.50  3.87 - 5.11 MIL/uL Final  . Hemoglobin 04/30/2020 11.5* 12.0 - 15.0 g/dL Final  . HCT 04/30/2020 37.9  36.0 - 46.0 % Final  . MCV 04/30/2020 84.2  80.0 - 100.0 fL Final  . MCH 04/30/2020 25.6* 26.0 - 34.0 pg Final  . MCHC 04/30/2020 30.3  30.0 - 36.0 g/dL Final  . RDW 04/30/2020 15.7* 11.5 - 15.5 % Final  . Platelets 04/30/2020 268  150 - 400 K/uL Final  . nRBC 04/30/2020 0.0  0.0 - 0.2 % Final  . Neutrophils Relative % 04/30/2020 57  % Final  . Neutro Abs 04/30/2020 6.5  1.7 - 7.7 K/uL Final  . Lymphocytes Relative 04/30/2020 33  % Final  . Lymphs Abs 04/30/2020 3.7  0.7 - 4.0 K/uL Final  . Monocytes Relative 04/30/2020 5  % Final  . Monocytes Absolute 04/30/2020 0.6  0.1 - 1.0 K/uL Final  . Eosinophils Relative 04/30/2020 4  % Final  . Eosinophils Absolute 04/30/2020 0.4  0.0 - 0.5 K/uL Final  . Basophils Relative 04/30/2020 1  % Final  . Basophils Absolute 04/30/2020 0.1  0.0 - 0.1 K/uL Final  . Immature Granulocytes 04/30/2020 0  % Final  . Abs  Immature Granulocytes 04/30/2020 0.03  0.00 - 0.07 K/uL Final   Performed at [facility], [place] [address], [place], [place] [address]  . I-stat hCG, quantitative 04/30/2020 <5.0  <5 mIU/mL Final  . Comment 3 04/30/2020          Final   Comment:   GEST. AGE      CONC.  (mIU/mL)   <=1 WEEK        5 - 50     2 WEEKS       50 - 500     3 WEEKS       100 - 10,000     4 WEEKS     1,000 - 30,000        FEMALE AND NON-PREGNANT FEMALE:     LESS THAN 5 mIU/mL   . Acetaminophen (Tylenol), Serum 04/30/2020 <10* 10 - 30 ug/mL Final   Comment: (NOTE) Therapeutic concentrations vary significantly. A range of 10-30 ug/mL  may be an effective concentration for many patients. However, some  are best treated at concentrations outside of this range. Acetaminophen concentrations >150 ug/mL at 4 hours after  ingestion  and >50 ug/mL at 12 hours after ingestion are often associated with  toxic reactions.  Performed at [facility], [place] [address], [place], [place] [address]   . Salicylate Lvl 65/06/5463 <7.0* 7.0 - 30.0 mg/dL Final   Performed at [place] [address], [place], [place] [address]  Admission on 04/24/2020, Discharged on 04/24/2020  Component Date Value Ref Range Status  . SARS Coronavirus 2 by RT PCR 04/24/2020 NEGATIVE  NEGATIVE Final   Comment: (NOTE) SARS-CoV-2 target nucleic acids are NOT DETECTED.  The SARS-CoV-2 RNA is generally detectable in upper respiratory specimens during the acute phase of infection. The lowest concentration of SARS-CoV-2 viral copies this assay can detect is 138 copies/mL. A negative result does not preclude SARS-Cov-2 infection and should not be used as the sole basis for treatment or other patient management decisions. A negative result may occur with  improper specimen collection/handling, submission of specimen other than nasopharyngeal swab, presence of viral mutation(s) within the areas  targeted by this assay, and inadequate number of viral copies(<138 copies/mL). A negative result must be combined with clinical observations, patient history, and epidemiological information. The expected result is Negative.  Fact Sheet for Patients:  EntrepreneurPulse.com.au  Fact Sheet for Healthcare Providers:  IncredibleEmployment.be  This test is no                          t yet approved or cleared by the [place] FDA and  has been authorized for detection and/or diagnosis of SARS-CoV-2 by FDA under an Emergency Use Authorization (EUA). This EUA will remain  in effect (meaning this test can be used) for the duration of the COVID-19 declaration under Section 564(b)(1) of the Act, 21 U.S.C.section 360bbb-3(b)(1), unless the authorization is terminated  or revoked sooner.      . Influenza A by PCR 04/24/2020 NEGATIVE  NEGATIVE Final  . Influenza B by PCR 04/24/2020 NEGATIVE  NEGATIVE Final   Comment: (NOTE) The Xpert Xpress SARS-CoV-2/FLU/RSV plus assay is intended as an aid in the diagnosis of influenza from Nasopharyngeal swab specimens and should not be used as a sole basis for treatment. Nasal washings and aspirates are unacceptable for Xpert Xpress SARS-CoV-2/FLU/RSV testing.  Fact Sheet for Patients: EntrepreneurPulse.com.au  Fact Sheet for Healthcare Providers: IncredibleEmployment.be  This test is not yet approved or cleared by the [place] FDA and has been authorized for detection and/or diagnosis of SARS-CoV-2 by FDA under an Emergency Use Authorization (EUA). This EUA will remain in effect (meaning this test can be used) for the duration of the COVID-19 declaration under Section 564(b)(1) of the Act, 21 U.S.C. section 360bbb-3(b)(1), unless the authorization is terminated or revoked.  Performed at [facility], [place] [address], [place], [place] [address]   .  Color, Urine 04/24/2020 YELLOW  YELLOW Final  . APPearance 04/24/2020 HAZY* CLEAR Final  . Specific Gravity, Urine 04/24/2020 1.027  1.005 - 1.030 Final  . pH 04/24/2020 5.0  5.0 - 8.0 Final  . Glucose, UA 04/24/2020 NEGATIVE  NEGATIVE mg/dL Final  . Hgb urine dipstick 04/24/2020 NEGATIVE  NEGATIVE Final  . Bilirubin Urine 04/24/2020 NEGATIVE  NEGATIVE Final  . Ketones, ur 04/24/2020 5* NEGATIVE mg/dL Final  . Protein, ur 04/24/2020 NEGATIVE  NEGATIVE mg/dL Final  . Nitrite 04/24/2020 NEGATIVE  NEGATIVE Final  . Chalmers Guest 04/24/2020 NEGATIVE  NEGATIVE Final   Performed at [facility], [place]  [address], [place], [place] [address]  . Preg Test, Ur 04/24/2020 NEGATIVE  NEGATIVE Final   Comment:        THE SENSITIVITY OF THIS METHODOLOGY IS >20 mIU/mL. Performed at [facility], [place] [address], [place], [place] [address]   Admission on 04/11/2020, Discharged on 04/11/2020  Component Date Value Ref Range Status  . Glucose-Capillary 04/11/2020 116* 70 - 99 mg/dL Final   Glucose reference range applies only to samples taken after fasting for at least 8 hours.  . Glucose-Capillary 04/11/2020 133* 70 - 99 mg/dL Final   Glucose reference range applies only to samples taken after fasting for at least 8 hours.  Admission on 04/10/2020, Discharged on 04/11/2020  Component Date Value Ref Range Status  . Sodium 04/10/2020 141  135 - 145 mmol/L Final  . Potassium 04/10/2020 3.6  3.5 - 5.1 mmol/L Final  . Chloride 04/10/2020 107  98 - 111 mmol/L Final  . CO2 04/10/2020 25  22 - 32 mmol/L Final  . Glucose, Bld 04/10/2020 90  70 - 99 mg/dL Final   Glucose reference range applies only to samples taken after fasting for at least 8 hours.  . BUN 04/10/2020 8  6 - 20 mg/dL Final  . Creatinine, Ser 04/10/2020 0.87  0.44 - 1.00 mg/dL Final  . Calcium 04/10/2020 9.0  8.9 - 10.3 mg/dL Final  . Total Protein 04/10/2020 7.1  6.5 - 8.1 g/dL Final  . Albumin 04/10/2020  4.0  3.5 - 5.0 g/dL Final  . AST 04/10/2020 22  15 - 41 U/L Final  . ALT 04/10/2020 19  0 - 44 U/L Final  . Alkaline Phosphatase 04/10/2020 36* 38 - 126 U/L Final  . Total Bilirubin 04/10/2020 0.7  0.3 - 1.2 mg/dL Final  . GFR, Estimated 04/10/2020 >60  >60 mL/min Final   Comment: (NOTE) Calculated using the CKD-EPI Creatinine Equation (2021)   . Anion gap 04/10/2020 9  5 - 15 Final   Performed at [facility], [place] [address] [place], [place] [address]  . Alcohol, Ethyl (B) 04/10/2020 <10  <10 mg/dL Final   Comment: (NOTE) Lowest detectable limit for serum alcohol is 10 mg/dL.  For medical purposes only. Performed at [facility], [place] [address], [place], [place] [address]   . Salicylate Lvl 03/00/9233 <7.0* 7.0 - 30.0 mg/dL Final   Performed at [place] [address] [place], [place] [address]  . Acetaminophen (Tylenol), Serum 04/10/2020 <10* 10 - 30 ug/mL Final   Comment: (NOTE) Therapeutic concentrations vary significantly. A range of 10-30 ug/mL  may be an effective concentration for many patients. However, some  are best treated at concentrations outside of this range. Acetaminophen concentrations >150 ug/mL at 4 hours after ingestion  and >50 ug/mL at 12 hours after ingestion are often associated with  toxic reactions.  Performed at [facility], [place] [address] [place], [place] [address]   . WBC 04/10/2020 10.1  4.0 - 10.5 K/uL Final  . RBC 04/10/2020 4.24  3.87 - 5.11 MIL/uL Final  . Hemoglobin 04/10/2020 11.0* 12.0 - 15.0 g/dL Final  . HCT 04/10/2020 35.2* 36.0 - 46.0 % Final  . MCV 04/10/2020 83.0  80.0 - 100.0 fL Final  . MCH 04/10/2020 25.9* 26.0 - 34.0 pg Final  . MCHC 04/10/2020 31.3  30.0 - 36.0 g/dL Final  . RDW 04/10/2020 15.2  11.5 - 15.5 % Final  . Platelets 04/10/2020 262  150 -  400 K/uL Final  . nRBC 04/10/2020 0.0  0.0 - 0.2 % Final   Performed at [facility], [place] [address] [place], [place] [address]  . Opiates 04/10/2020 NONE DETECTED  NONE DETECTED Final  . Cocaine 04/10/2020 NONE DETECTED  NONE DETECTED Final  . Benzodiazepines 04/10/2020 NONE DETECTED  NONE DETECTED Final  . Amphetamines 04/10/2020 NONE DETECTED  NONE DETECTED Final  . Tetrahydrocannabinol 04/10/2020 NONE DETECTED  NONE DETECTED Final  . Barbiturates 04/10/2020 NONE DETECTED  NONE DETECTED Final   Comment: (NOTE) DRUG SCREEN FOR MEDICAL PURPOSES ONLY.  IF CONFIRMATION IS NEEDED FOR ANY PURPOSE, NOTIFY LAB WITHIN 5 DAYS.  LOWEST DETECTABLE LIMITS FOR URINE DRUG SCREEN Drug Class                     Cutoff (ng/mL) Amphetamine and metabolites    1000 Barbiturate and metabolites    200 Benzodiazepine                 604 Tricyclics and metabolites     300 Opiates and metabolites        300 Cocaine and metabolites        300 THC                            50 Performed at [facility], [place] [address] [place], [place] [address]   . I-stat hCG, quantitative 04/10/2020 <5.0  <5 mIU/mL Final  . Comment 3 04/10/2020          Final   Comment:   GEST. AGE      CONC.  (mIU/mL)   <=1 WEEK        5 - 50     2 WEEKS       50 - 500     3 WEEKS       100 - 10,000     4 WEEKS     1,000 - 30,000        FEMALE AND NON-PREGNANT FEMALE:     LESS THAN 5 mIU/mL   . SARS Coronavirus 2 by RT PCR 04/10/2020 NEGATIVE  NEGATIVE Final   Comment: (NOTE) SARS-CoV-2 target nucleic acids are NOT DETECTED.  The SARS-CoV-2 RNA is generally detectable in upper respiratory specimens during the acute phase of infection. The lowest concentration of SARS-CoV-2 viral copies this assay can detect is 138 copies/mL. A negative result does not preclude SARS-Cov-2 infection and should not be used as the sole basis for treatment or other patient management decisions. A negative result may occur with  improper specimen collection/handling, submission of specimen other than  nasopharyngeal swab, presence of viral mutation(s) within the areas targeted by this assay, and inadequate number of viral copies(<138 copies/mL). A negative result must be combined with clinical observations, patient history, and epidemiological information. The expected result is Negative.  Fact Sheet for Patients:  EntrepreneurPulse.com.au  Fact Sheet for Healthcare Providers:  IncredibleEmployment.be  This test is no                          t yet approved or cleared by the [place] FDA and  has been authorized for detection and/or diagnosis of SARS-CoV-2 by FDA under an Emergency Use Authorization (EUA). This EUA will remain  in effect (meaning this test can be used) for the duration of the COVID-19 declaration under Section 564(b)(1) of  the Act, 21 U.S.C.section 360bbb-3(b)(1), unless the authorization is terminated  or revoked sooner.      . Influenza A by PCR 04/10/2020 NEGATIVE  NEGATIVE Final  . Influenza B by PCR 04/10/2020 NEGATIVE  NEGATIVE Final   Comment: (NOTE) The Xpert Xpress SARS-CoV-2/FLU/RSV plus assay is intended as an aid in the diagnosis of influenza from Nasopharyngeal swab specimens and should not be used as a sole basis for treatment. Nasal washings and aspirates are unacceptable for Xpert Xpress SARS-CoV-2/FLU/RSV testing.  Fact Sheet for Patients: EntrepreneurPulse.com.au  Fact Sheet for Healthcare Providers: IncredibleEmployment.be  This test is not yet approved or cleared by the [place] FDA and has been authorized for detection and/or diagnosis of SARS-CoV-2 by FDA under an Emergency Use Authorization (EUA). This EUA will remain in effect (meaning this test can be used) for the duration of the COVID-19 declaration under Section 564(b)(1) of the Act, 21 U.S.C. section 360bbb-3(b)(1), unless the authorization is terminated or revoked.  Performed at [facility], [place] [address] [place], [place] [address]   . Lithium Lvl 04/10/2020 0.36* 0.60 - 1.20 mmol/L Final   Performed at [place] [address] [place], [place] [address]  . Glucose-Capillary 04/10/2020 108* 70 - 99 mg/dL Final   Glucose reference range applies only to samples taken after fasting for at least 8 hours.  Admission on 04/06/2020, Discharged on 04/06/2020  Component Date Value Ref Range Status  . I-stat hCG, quantitative 04/06/2020 <5.0  <5 mIU/mL Final  . Comment 3 04/06/2020          Final   Comment:   GEST. AGE      CONC.  (mIU/mL)   <=1 WEEK        5 - 50     2 WEEKS       50 - 500     3 WEEKS       100 - 10,000     4 WEEKS     1,000 - 30,000        FEMALE AND NON-PREGNANT FEMALE:     LESS THAN 5 mIU/mL   Admission on 04/03/2020, Discharged on 04/03/2020  Component Date Value Ref Range Status  . Sodium 04/03/2020 138  135 - 145 mmol/L Final  . Potassium 04/03/2020 3.8  3.5 - 5.1 mmol/L Final  . Chloride 04/03/2020 105  98 - 111 mmol/L Final  . CO2 04/03/2020 24  22 - 32 mmol/L Final  . Glucose, Bld 04/03/2020 110* 70 - 99 mg/dL Final   Glucose reference range applies only to samples taken after fasting for at least 8 hours.  . BUN 04/03/2020 8  6 - 20 mg/dL Final  . Creatinine, Ser 04/03/2020 0.84  0.44 - 1.00 mg/dL Final  . Calcium 04/03/2020 9.4  8.9 - 10.3 mg/dL Final  . GFR, Estimated 04/03/2020 >60  >60 mL/min Final   Comment: (NOTE) Calculated using the CKD-EPI Creatinine Equation (2021)   . Anion gap 04/03/2020 9  5 - 15 Final   Performed at [facility], [place] [address] [place], [place] [address]  . WBC 04/03/2020 13.9* 4.0 - 10.5 K/uL Final  . RBC 04/03/2020 4.55  3.87 - 5.11 MIL/uL Final  . Hemoglobin 04/03/2020 11.5* 12.0 - 15.0 g/dL Final  . HCT 04/03/2020 37.2  36.0 - 46.0 % Final  . MCV 04/03/2020 81.8  80.0 - 100.0 fL Final  . MCH 04/03/2020 25.3* 26.0 - 34.0 pg Final  .  MCHC 04/03/2020 30.9  30.0  - 36.0 g/dL Final  . RDW 04/03/2020 14.4  11.5 - 15.5 % Final  . Platelets 04/03/2020 321  150 - 400 K/uL Final  . nRBC 04/03/2020 0.0  0.0 - 0.2 % Final   Performed at [facility], [place] [address], [place], [place] [address]  . Troponin I (High Sensitivity) 04/03/2020 3  <18 ng/L Final   Comment: (NOTE) Elevated high sensitivity troponin I (hsTnI) values and significant  changes across serial measurements may suggest ACS but many other  chronic and acute conditions are known to elevate hsTnI results.  Refer to the "Links" section for chest pain algorithms and additional  guidance. Performed at [facility], [place] [address] [place], [place] [address]   . I-stat hCG, quantitative 04/03/2020 <5.0  <5 mIU/mL Final  . Comment 3 04/03/2020          Final   Comment:   GEST. AGE      CONC.  (mIU/mL)   <=1 WEEK        5 - 50     2 WEEKS       50 - 500     3 WEEKS       100 - 10,000     4 WEEKS     1,000 - 30,000        FEMALE AND NON-PREGNANT FEMALE:     LESS THAN 5 mIU/mL   . Troponin I (High Sensitivity) 04/03/2020 <2  <18 ng/L Final   Comment: (NOTE) Elevated high sensitivity troponin I (hsTnI) values and significant  changes across serial measurements may suggest ACS but many other  chronic and acute conditions are known to elevate hsTnI results.  Refer to the "Links" section for chest pain algorithms and additional  guidance. Performed at [facility], [place] [address], [place], [place] [address]   There may be more visits with results that are not included.    Allergies: Wellbutrin [bupropion], Omnipaque [iohexol], Penicillins, Cogentin [benztropine], and Depakote er [divalproex sodium er]  PTA Medications: (Not in a hospital admission)   Medical Decision Making  Patient unable to contract for safety at this time. Admit to [facility] observation for continuous assessment.   Routine Labs ordered-pregnancy test, UDS, covid Patient with recent routine labs that were  not ordered this presentation: Li level 0/39 on 05/26/20 Lipid panel with elevated triglycerides and low HDL (05/03/20) Recent CBC, CMP from 2/1 unrekarkable  a1c 5.6 (05/03/20) EKG from 1/20 qtc 439; no significant change since last tracing per reading   -re-ordered home medication    Recommendations  Based on my evaluation the patient does not appear to have an emergency medical condition.  Ival Bible, MD 06/04/20  3:04 PM

## 2020-06-04 NOTE — ED Provider Notes (Signed)
[facility] EMERGENCY DEPARTMENT Provider Note   CSN: [id number] Arrival date & time: 06/04/20  2026     History Chief Complaint  Patient presents with  . Suicidal    [name] is a 30 y.o. female with a hx of tobacco abuse, bipolar 1 disorder, diabetes mellitus, hypertension, & schizophrenia who presents to the ED from [facility] for overnight observation due to [place]. Patient states she has been feeling anxious & depressed over the past 3-4 days with thoughts of suicide and plan to overdose on her medications- has not made an attempt over the past few days. Went to [facility] and was sent to the ED for overnight observation & continuous [facility] assessments. Patient denies HI, hallucinations, chest pain, dyspnea, cough, or syncope. Denies alcohol or drug use.    HPI     Past Medical History:  Diagnosis Date  . Anxiety   . Bipolar 1 disorder ([place])   . Cognitive deficits   . Depression   . Diabetes mellitus without complication ([place])   . Hypertension   . Mental disorder   . Mental health disorder   . Obesity     Patient Active Problem List   Diagnosis Date Noted  . Gastroesophageal reflux disease 05/04/2020  . Hyperglycemia due to type 2 diabetes mellitus ([place]) 05/04/2020  . Long term (current) use of insulin ([place]) 05/04/2020  . Migraine without aura 05/04/2020  . Morbid obesity ([place]) 05/04/2020  . 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 2 diabetes mellitus ([place]) 05/04/2020  . Prolapsed internal hemorrhoids 05/04/2020  . Vitamin D deficiency 05/04/2020  . Other symptoms and signs involving cognitive functions and awareness 05/04/2020  . Suicide attempt ([place])   . Anxiety state 03/06/2020  . Schizophrenia ([place]) 09/13/2019  . Bipolar I disorder, most recent episode depressed ([place]) 06/23/2019  . MDD (major depressive disorder) 10/10/2018  . Schizoaffective disorder, bipolar type ([place]) 09/25/2018  . Affective psychosis, bipolar ([place]) 06/13/2018  . HTN (hypertension) 05/03/2018  .  Tobacco use disorder 05/03/2018  . Adjustment disorder with emotional disturbance 01/02/2018  . Schizophrenia, disorganized ([place]) 11/30/2017  . Moderate bipolar I disorder, most recent episode depressed ([place])   . Psychosis ([place])   . 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 OCD (obsessive compulsive disorder) 10/05/2016  . Major depressive disorder, recurrent episode, mild ([place]) 05/04/2016  . 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 Learning disability 07/18/2015  . 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 Diabetes mellitus ([place]) 07/18/2015  . MDD (major depressive disorder), recurrent, severe, with psychosis ([place]) 07/18/2015  . Hyperlipidemia 07/18/2015  . Severe episode of recurrent major depressive disorder, without psychotic features ([place])   . Suicidal ideation   . Drug overdose   . Cognitive deficits 10/12/2012  . Generalized anxiety disorder 06/28/2012    Past Surgical History:  Procedure Laterality Date  . CESAREAN SECTION    . CESAREAN SECTION N/A 04/25/2013   Procedure: REPEAT CESAREAN SECTION;  Surgeon: [name], MD;  Location: [facility];  Service: Obstetrics;  Laterality: N/A;  . MASS EXCISION N/A 06/03/2012   Procedure: EXCISION MASS;  Surgeon: [name], MD;  Location: [place];  Service: ENT;  Laterality: N/A;  Excision uvula mass  . TONSILLECTOMY N/A 06/03/2012   Procedure: TONSILLECTOMY;  Surgeon: [name], MD;  Location: [place];  Service: ENT;  Laterality: N/A;  . TONSILLECTOMY       OB History    Durenda Age  1   Para  3   Term  3   Preterm  0   AB  0   Living  3     SAB  0   IAB  0   Ectopic  0   Multiple      Live Births  3           Family History  Problem Relation Age of Onset  . Hypertension Mother   . Diabetes Father     Social History   Tobacco Use  . Smoking status: Current Every Day Smoker     Packs/day: 4.00    Years: 11.00    Pack years: 44.00    Types: Cigarettes, Cigars  . Smokeless tobacco: Never Used  . Tobacco comment: Pt declined  Vaping Use  . Vaping Use: Never used  Substance Use Topics  . Alcohol use: Not Currently    Comment: occ: last intake 4 mts ago  . Drug use: Not Currently    Types: "Crack" cocaine, Other-see comments    Comment: Patient reports hx of smoking Crack    Home Medications Prior to Admission medications   Medication Sig Start Date End Date Taking? Authorizing Provider  BANOPHEN 25 MG capsule Take 25 mg by mouth at bedtime. 04/30/20   [provider]  benztropine (COGENTIN) 1 MG tablet Take 1 mg by mouth 2 (two) times daily. 05/01/20   [provider]  cholecalciferol (VITAMIN D) 25 MCG (1000 UNIT) tablet Take 1,000 Units by mouth daily. 04/30/20   [provider]  DULoxetine (CYMBALTA) 60 MG capsule Take 1 capsule (60 mg total) by mouth daily. 05/31/20   Money, Lowry Ram, FNP  glipiZIDE (GLUCOTROL XL) 10 MG 24 hr tablet Take 10 mg by mouth once.    [provider]  haloperidol (HALDOL) 10 MG tablet Take 10 mg by mouth at bedtime. 05/09/20   [provider]  haloperidol decanoate (HALDOL DECANOATE) 50 MG/ML injection Inject 50 mg into the muscle every 28 (twenty-eight) days. 03/19/20   [provider]  hydrOXYzine (ATARAX/VISTARIL) 25 MG tablet Take 1 tablet (25 mg total) by mouth 3 (three) times daily as needed for anxiety. 05/27/20   Derrill Center, NP  insulin detemir (LEVEMIR) 100 UNIT/ML injection Inject 0.4 mLs (40 Units total) into the skin 2 (two) times daily. 05/05/20   Montine Circle, PA-C  lithium carbonate (LITHOBID) 300 MG CR tablet Take 2 tablets (600 mg total) by mouth every 12 (twelve) hours. Patient taking differently: Take 600 mg by mouth 3 (three) times daily. 05/05/20 06/04/20  Montine Circle, PA-C  omeprazole (PRILOSEC) 20 MG capsule Take 20 mg by mouth daily. 04/30/20   [provider]  ondansetron (ZOFRAN ODT) 4 MG disintegrating tablet Take 1 tablet (4 mg total) by mouth every 8 (eight) hours as needed for nausea or vomiting. 05/05/20   Montine Circle, PA-C  propranolol (INDERAL) 10 MG tablet Take 1 tablet (10 mg total) by mouth 2 (two) times daily. 05/05/20 06/04/20  Montine Circle, PA-C  simvastatin (ZOCOR) 20 MG tablet Take 20 mg by mouth at bedtime. 08/21/19   [provider]  vitamin B-12 (CYANOCOBALAMIN) 1000 MCG tablet Take 1,000 mcg by mouth daily. 11/13/19   [provider]    Allergies    Wellbutrin [bupropion], Omnipaque [iohexol], Penicillins, Cogentin [benztropine], and Depakote er [divalproex sodium er]  Review of Systems   Review of Systems  Constitutional: Negative for chills and fever.  Respiratory: Negative for  stridor.   Cardiovascular: Negative for chest pain.  Gastrointestinal: Negative for abdominal pain.  Neurological: Negative for syncope.  Psychiatric/Behavioral: Positive for suicidal ideas. Negative for hallucinations. The patient is nervous/anxious.   All other systems reviewed and are negative.   Physical Exam Updated Vital Signs BP 115/62 (BP Location: Right Arm)   Pulse 74   Temp 98.5 F (36.9 C) (Oral)   Resp (!) 22   SpO2 98%   Physical Exam Vitals and nursing note reviewed.  Constitutional:      General: She is not in acute distress.    Appearance: She is well-developed. She is not toxic-appearing.  HENT:     Head: Normocephalic and atraumatic.  Eyes:     General:        Right eye: No discharge.        Left eye: No discharge.     Conjunctiva/sclera: Conjunctivae normal.  Cardiovascular:     Rate and Rhythm: Normal rate and regular rhythm.  Pulmonary:     Effort: Pulmonary effort is normal. No respiratory distress.     Breath sounds: Normal breath sounds. No wheezing, rhonchi or rales.  Abdominal:     General: There is no distension.     Palpations: Abdomen is soft.     Tenderness:  There is no abdominal tenderness.  Musculoskeletal:     Cervical back: Neck supple.  Skin:    General: Skin is warm and dry.     Findings: No rash.  Neurological:     Mental Status: She is alert.     Comments: Clear speech.   Psychiatric:        Mood and Affect: Affect is flat.     ED Results / Procedures / Treatments   Labs (all labs ordered are listed, but only abnormal results are displayed) Labs Reviewed  SALICYLATE LEVEL - Abnormal; Notable for the following components:      Result Value   Salicylate Lvl <1.8 (*)    All other components within normal limits  ACETAMINOPHEN LEVEL - Abnormal; Notable for the following components:   Acetaminophen (Tylenol), Serum <10 (*)    All other components within normal limits  CBC - Abnormal; Notable for the following components:   WBC 12.0 (*)    MCV 77.6 (*)    MCH 25.1 (*)    RDW 16.1 (*)    All other components within normal limits  COMPREHENSIVE METABOLIC PANEL  ETHANOL  I-STAT BETA HCG BLOOD, ED ([facility], [facility], [facility] ONLY)    EKG None  Radiology No results found.  Procedures Procedures   Medications Ordered in ED Medications - No data to display  ED Course  I have reviewed the triage vital signs and the nursing notes.  Pertinent labs & imaging results that were available during my care of the patient were reviewed by me and considered in my medical decision making (see chart for details).    MDM Rules/Calculators/A&P                          Patient presents to the ED for continued observation/behavioral health assessment. Nontoxic, vitals w/o significant abnormality.   Additional history obtained:  Additional history obtained from chart review & nursing note review.  Seen @ [place], due to positive covid testing sent to ED for continued observation & psychiatry reassessments. COVID positive today, however was covid positive 05/04/20- asymptomatic from a covid 19 stand point and past isolation need point.  Lab Tests:   I reviewed interpreted labs, which included: CBC, CMP, ethanol, preg test- grossly unremarkable, mild leukocytosis felt to be nonspecific.   ED Course:  Home meds re-ordered, glucose on CMP 89- ordered CBG prior to insulin in the AM for precautionary purposes, patient was given dinner this evening after lab draw and has been frequently awake & mentating approrpiately. Patient medically cleared. Disposition per [facility].   The patient has been placed in psychiatric observation due to the need to provide a safe environment for the patient while obtaining psychiatric consultation and evaluation, as well as ongoing medical and medication management to treat the patient's condition.  The patient has not been placed under full IVC at this time.  Portions of this note were generated with [employer]obby[employer]. Dictation errors may occur despite best attempts at proofreading.  Final Clinical Impression(s) / ED Diagnoses Final diagnoses:  Suicidal ideation    Rx / DC Orders ED Discharge Orders    None       Amaryllis Dyke, PA-C 06/05/20 0457    Merrily Pew, MD 06/05/20 2325

## 2020-06-04 NOTE — ED Notes (Signed)
EMS arrived to transport patient. Patient calm & cooperative, ambulating independently, in no distress at time of discharge. Belongings returned by deputy as patient was leaving.

## 2020-06-04 NOTE — BH Assessment (Signed)
Comprehensive Clinical Assessment (CCA) Note  06/04/2020 Harvest Deist [id number]   Patient is a 30 year old female presenting voluntarily to [facility] via [facility] with a chief complaint of increased anxiety and suicidal ideation. Patient is well known to [facility] service line due to numerous encounters. She has history of misusing ED and [facility] services. Patient has already had 10 ED/[facility]UC visits in 2022 with the same complaint. Patient reports "my nerves are so bad I want to hurt myself." Patient reports SI with a plan to overdose. She reports "I'm going to take a bigger overdose than last time. I have gabapentin and hydroxyzine that yall gave me." Patient denies HI/AVH. She has ACTT services with [facility] and states she is compliant with medications. Patient potentially seeking in patient treatment for secondary gain.  Dr. Serafina Mitchell recommends patient be admitted to continuous assessment.  Chief Complaint:  Chief Complaint  Patient presents with  . Suicidal   Visit Diagnosis: Bipolar I (per history)   CCA Screening, Triage and Referral (STR)  Patient Reported Information How did you hear about [place]? Legal System  Referral name: [employer]  Referral phone number: No data recorded  Whom do you see for routine medical problems? Primary Care  Practice/Facility Name: [facility] ACTT-Dr Jake Samples  Practice/Facility Phone Number: [phone]  Name of Contact: [place]  Contact Number: [phone] [name] 04/11/2020)  Contact Fax Number: [phone] ([place] 04/11/2020)  Prescriber Name: Unknown [name] 05/22/2020)  Prescriber Address (if known): Unknown ([employer] 05/22/2020)   What Is the Reason for Your Visit/Call Today? suicidal, overdosed on Neurontin in a suicide attempt.  Patient took more pills than prescribed, but not a lethal amount  How Long Has This Been Causing You Problems? > than 6 months  What Do You Feel Would Help You the Most Today? Therapy; Assessment Only;  Medication   Have You Recently Been in Any Inpatient Treatment (Hospital/Detox/Crisis Center/28-Day Program)? Yes  Name/Location of Program/Hospital:[facility]  How Long Were You There? 5 days  When Were You Discharged? 05/04/2020   Have You Ever Received Services From [employer] Before? Yes  Who Do You See at [facility]? [facility] inpatient treatment   Have You Recently Had Any Thoughts About Hurting Yourself? Yes  Are You Planning to Commit Suicide/Harm Yourself At This time? Yes   Have you Recently Had Thoughts About Hurting Someone [name]? No  Explanation: No data recorded  Have You Used Any Alcohol or Drugs in the Past 24 Hours? No  How Long Ago Did You Use Drugs or Alcohol? No data recorded What Did You Use and How Much? No data recorded  Do You Currently Have a Therapist/Psychiatrist? Yes  Name of Therapist/Psychiatrist: ACTT   Have You Been Recently Discharged From Any Office Practice or Programs? No  Explanation of Discharge From Practice/Program: No data recorded    CCA Screening Triage Referral Assessment Type of Contact: Face-to-Face  Is this Initial or Reassessment? Initial Assessment  Date Telepsych consult ordered in [facility]:  06/04/2020  Time Telepsych consult ordered in [facility]:  1430   Patient Reported Information Reviewed? Yes  Patient Left Without Being Seen? No data recorded Reason for Not Completing Assessment: No data recorded  Collateral Involvement: none available   Does Patient Have a Court Appointed Legal Guardian? No data recorded Name and Contact of Legal Guardian: Self.   If Minor and Not Living with Parent(s), Who has Custody? Self  Is CPS involved or ever been involved? Never  Is APS involved or ever been involved? Never  Patient Determined To Be At Risk for Harm To Self or Others Based on Review of Patient Reported Information or Presenting Complaint? Yes, for Self-Harm  Method: No data recorded Availability of Means: No data  recorded Intent: No data recorded Notification Required: No data recorded Additional Information for Danger to Others Potential: No data recorded Additional Comments for Danger to Others Potential: No data recorded Are There Guns or Other Weapons in Your Home? No data recorded Types of Guns/Weapons: No data recorded Are These Weapons Safely Secured?                            No data recorded Who Could Verify You Are Able To Have These Secured: No data recorded Do You Have any Outstanding Charges, Pending Court Dates, Parole/Probation? No data recorded Contacted To Inform of Risk of Harm To Self or Others: Unable to Contact: (TTS or Social work will notify [facility] of patient's ED admission)   Location of Assessment: [facility] Assessment Services   Does Patient Present under Involuntary Commitment? No  IVC Papers Initial File Date: No data recorded  [place] of Residence: [place]   Patient Currently Receiving the Following Services: ACTT Architect)   Determination of Need: Urgent (48 hours)   Options For Referral: BH Urgent Care     CCA Biopsychosocial Intake/Chief Complaint:  Depression and anxiety  Current Symptoms/Problems: Suicidal ideation, anger, despondency, depression, anxiety   Patient Reported Schizophrenia/Schizoaffective Diagnosis in Past: Yes   Strengths: Communication  Preferences: Pt wants to be placed in a group home --''I can't live independently''  Abilities: Patient was not able to identify any abilities that she has   Type of Services Patient Feels are Needed: Housing   Initial Clinical Notes/Concerns: Pt has numerous presentations to the ED, [place], and [place].  Pt endorsed suicidal ideation.   Mental Health Symptoms Depression:  Change in energy/activity; Hopelessness; Increase/decrease in appetite; Irritability; Sleep (too much or little); Weight gain/loss; Worthlessness; Fatigue; Tearfulness   Duration of  Depressive symptoms: Greater than two weeks   Mania:  N/A   Anxiety:   N/A   Psychosis:  None   Duration of Psychotic symptoms: No data recorded  Trauma:  None   Obsessions:  None   Compulsions:  None   Inattention:  None   Hyperactivity/Impulsivity:  N/A   Oppositional/Defiant Behaviors:  None   Emotional Irregularity:  Recurrent suicidal behaviors/gestures/threats; Mood lability; Unstable self-image   Other Mood/Personality Symptoms:  NA    Mental Status Exam Appearance and self-care  Stature:  Tall   Weight:  Overweight   Clothing:  Neat/clean; Age-appropriate   Grooming:  Normal   Cosmetic use:  Age appropriate   Posture/gait:  Normal   Motor activity:  Not Remarkable   Sensorium  Attention:  Normal   Concentration:  Normal   Orientation:  X5   Recall/memory:  Normal   Affect and Mood  Affect:  Depressed; Anxious   Mood:  Depressed; Anxious   Relating  Eye contact:  Normal   Facial expression:  Constricted   Attitude toward examiner:  Cooperative   Thought and Language  Speech flow: Loud; Clear and Coherent   Thought content:  Appropriate to Mood and Circumstances   Preoccupation:  None   Hallucinations:  None   Organization:  No data recorded  [employer] of Knowledge:  Average   Intelligence:  Average   Abstraction:  Normal  Judgement:  Fair   Art therapist:  Adequate   Insight:  Fair   Decision Making:  Vacilates   Social Functioning  Social Maturity:  Isolates   Social Judgement:  Victimized   Stress  Stressors:  Housing; Relationship   Coping Ability:  Exhausted   Skill Deficits:  Decision making   Supports:  Other (Comment); Family     Religion: Religion/Spirituality Are You A Religious Person?: Yes What is Your Religious Affiliation?: Christian  Leisure/Recreation: Leisure / Recreation Do You Have Hobbies?: No  Exercise/Diet: Exercise/Diet Do You Exercise?: Yes What Type of  Exercise Do You Do?: Run/Walk (patient walks to places that she needs to go) How Many Times a Week Do You Exercise?: 6-7 times a week Have You Gained or Lost A Significant Amount of Weight in the Past Six Months?: No Do You Follow a Special Diet?: No Do You Have Any Trouble Sleeping?: Yes Explanation of Sleeping Difficulties: insomnia, nightmares   CCA Employment/Education Employment/Work Situation: Employment / Work Situation Employment situation: On disability Why is patient on disability: Bipolar I How long has patient been on disability: UTA Patient's job has been impacted by current illness: No What is the longest time patient has a held a job?: UTA Where was the patient employed at that time?: [employer] Has patient ever been in the [employer]?: No  Education: Education Last Grade Completed:  (not assessed) Name of [place]: not assessed Did Teacher, adult education From [employer]?: No (not assessed) Did You Attend College?: No Did You Attend Graduate School?:  (not assessed) Did You Have Any Special Interests In School?: not assessed Did You Have An Individualized Education Program (IIEP): No Did You Have Any Difficulty At School?: No   CCA Family/Childhood History Family and Relationship History: Family history Marital status: Single Are you sexually active?: Yes What is your sexual orientation?: heterosexual Has your sexual activity been affected by drugs, alcohol, medication, or emotional stress?: Client denies.  Does patient have children?: Yes How many children?: 3 How is patient's relationship with their children?: Three daughters, ages 27, 64, 67, who are in foster care and adoption process  Childhood History:  Childhood History By whom was/is the patient raised?: Mother Additional childhood history information: Patient reports "I only seen him (father) about three times in my life and now he don't want nothing to do with me." Description of patient's relationship with  caregiver when they were a child: Patient reports "it was good, she raised me good".  Patient's description of current relationship with people who raised him/her: Patient states that she still has a good relationship with her mother How were you disciplined when you got in trouble as a child/adolescent?: Patient reports "nothing". Does patient have siblings?: Yes Description of patient's current relationship with siblings: Patient reports "I have two sisters, one I get along with one of them.  The other I don't hear from that much.  Ever since she married that crazy boy." Did patient suffer any verbal/emotional/physical/sexual abuse as a child?: No Did patient suffer from severe childhood neglect?: No Has patient ever been sexually abused/assaulted/raped as an adolescent or adult?: No Was the patient ever a victim of a crime or a disaster?: No Witnessed domestic violence?: No Has patient been affected by domestic violence as an adult?: No  Child/Adolescent Assessment:     CCA Substance Use Alcohol/Drug Use: Alcohol / Drug Use Pain Medications: Please see MAR Prescriptions: Please see MAR Over the Counter: Please see MAR History of alcohol /  drug use?: No history of alcohol / drug abuse Longest period of sobriety (when/how long): NA                         ASAM's:  Six Dimensions of Multidimensional Assessment  Dimension 1:  Acute Intoxication and/or Withdrawal Potential:      Dimension 2:  Biomedical Conditions and Complications:      Dimension 3:  Emotional, Behavioral, or Cognitive Conditions and Complications:     Dimension 4:  Readiness to Change:     Dimension 5:  Relapse, Continued use, or Continued Problem Potential:     Dimension 6:  Recovery/Living Environment:     ASAM Severity Score:    ASAM Recommended Level of Treatment:     Substance use Disorder (SUD)    Recommendations for Services/Supports/Treatments:    DSM5 Diagnoses: Patient Active Problem  List   Diagnosis Date Noted  . Gastroesophageal reflux disease 05/04/2020  . Hyperglycemia due to type 2 diabetes mellitus ([place]) 05/04/2020  . Long term (current) use of insulin ([place]) 05/04/2020  . Migraine without aura 05/04/2020  . Morbid obesity ([place]) 05/04/2020  . 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 2 diabetes mellitus ([place]) 05/04/2020  . Prolapsed internal hemorrhoids 05/04/2020  . Vitamin D deficiency 05/04/2020  . Other symptoms and signs involving cognitive functions and awareness 05/04/2020  . Suicide attempt ([place])   . Anxiety state 03/06/2020  . Schizophrenia ([place]) 09/13/2019  . Bipolar I disorder, most recent episode depressed ([place]) 06/23/2019  . MDD (major depressive disorder) 10/10/2018  . Schizoaffective disorder, bipolar type ([place]) 09/25/2018  . Affective psychosis, bipolar ([place]) 06/13/2018  . HTN (hypertension) 05/03/2018  . Tobacco use disorder 05/03/2018  . Adjustment disorder with emotional disturbance 01/02/2018  . 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 Moderate bipolar I disorder, most recent episode depressed ([place])   . Psychosis ([place])   . 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 OCD (obsessive compulsive disorder) 10/05/2016  . Major depressive disorder, recurrent episode, mild ([place]) 05/04/2016  . 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 Learning disability 07/18/2015  . 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 Diabetes mellitus ([place]) 07/18/2015  . 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 Hyperlipidemia 07/18/2015  . Severe episode of recurrent major depressive disorder, without psychotic features ([place])   . Suicidal ideation   . Drug overdose   . Cognitive deficits 10/12/2012  . Generalized anxiety disorder 06/28/2012    Patient Centered Plan: Patient is on the following Treatment Plan(s):   Referrals to Alternative  Service(s): Referred to Alternative Service(s):   Place:   Date:   Time:    Referred to Alternative Service(s):   Place:   Date:   Time:    Referred to Alternative Service(s):   Place:   Date:   Time:    Referred to Alternative Service(s):   Place:   Date:   Time:     Orvis Brill, LCSW

## 2020-06-04 NOTE — ED Notes (Signed)
[place] charge RN notified of impending transfer. EMS called for transport.

## 2020-06-04 NOTE — ED Triage Notes (Signed)
Pt arrives via [facility] from [facility], voluntary with c/o suicidal thoughts. Pt tested COVID + today,  (initally covid + on 05/04/2020), no current symptoms.

## 2020-06-05 ENCOUNTER — Encounter (HOSPITAL_COMMUNITY): Payer: Self-pay | Admitting: Student

## 2020-06-05 DIAGNOSIS — Z765 Malingerer [conscious simulation]: Secondary | ICD-10-CM

## 2020-06-05 LAB — CBG MONITORING, ED: Glucose-Capillary: 139 mg/dL — ABNORMAL HIGH (ref 70–99)

## 2020-06-05 MED ORDER — INSULIN DETEMIR 100 UNIT/ML ~~LOC~~ SOLN
40.0000 [IU] | Freq: Two times a day (BID) | SUBCUTANEOUS | Status: DC
  Administered 2020-06-05: 40 [IU] via SUBCUTANEOUS
  Filled 2020-06-05 (×2): qty 0.4

## 2020-06-05 MED ORDER — ALUM & MAG HYDROXIDE-SIMETH 200-200-20 MG/5ML PO SUSP: 30.0000 mL | Freq: Four times a day (QID) | ORAL | Status: DC | PRN

## 2020-06-05 MED ORDER — ACETAMINOPHEN 325 MG PO TABS: 650.0000 mg | ORAL_TABLET | ORAL | Status: DC | PRN

## 2020-06-05 MED ORDER — SIMVASTATIN 20 MG PO TABS: 20.0000 mg | ORAL_TABLET | Freq: Every day | ORAL | Status: DC

## 2020-06-05 MED ORDER — LITHIUM CARBONATE ER 300 MG PO TBCR
600.0000 mg | EXTENDED_RELEASE_TABLET | Freq: Two times a day (BID) | ORAL | Status: DC
  Administered 2020-06-05: 600 mg via ORAL
  Filled 2020-06-05 (×2): qty 2

## 2020-06-05 MED ORDER — PANTOPRAZOLE SODIUM 40 MG PO TBEC
40.0000 mg | DELAYED_RELEASE_TABLET | Freq: Every day | ORAL | Status: DC
  Administered 2020-06-05: 40 mg via ORAL
  Filled 2020-06-05: qty 1

## 2020-06-05 MED ORDER — ONDANSETRON 4 MG PO TBDP: 4.0000 mg | ORAL_TABLET | Freq: Three times a day (TID) | ORAL | Status: DC | PRN

## 2020-06-05 MED ORDER — DULOXETINE HCL 60 MG PO CPEP
60.0000 mg | ORAL_CAPSULE | Freq: Every day | ORAL | Status: DC
  Administered 2020-06-05: 60 mg via ORAL
  Filled 2020-06-05: qty 1

## 2020-06-05 MED ORDER — NICOTINE 21 MG/24HR TD PT24
21.0000 mg | MEDICATED_PATCH | Freq: Every day | TRANSDERMAL | Status: DC
  Filled 2020-06-05: qty 1

## 2020-06-05 MED ORDER — VITAMIN D 25 MCG (1000 UNIT) PO TABS
1000.0000 [IU] | ORAL_TABLET | Freq: Every day | ORAL | Status: DC
  Administered 2020-06-05: 1000 [IU] via ORAL
  Filled 2020-06-05: qty 1

## 2020-06-05 MED ORDER — HALOPERIDOL 5 MG PO TABS
10.0000 mg | ORAL_TABLET | Freq: Every day | ORAL | Status: DC
  Administered 2020-06-05: 10 mg via ORAL
  Filled 2020-06-05: qty 2

## 2020-06-05 MED ORDER — VITAMIN B-12 1000 MCG PO TABS
1000.0000 ug | ORAL_TABLET | Freq: Every day | ORAL | Status: DC
  Administered 2020-06-05: 1000 ug via ORAL
  Filled 2020-06-05: qty 1

## 2020-06-05 MED ORDER — PROPRANOLOL HCL 10 MG PO TABS
10.0000 mg | ORAL_TABLET | Freq: Two times a day (BID) | ORAL | Status: DC
  Filled 2020-06-05: qty 1

## 2020-06-05 MED ORDER — BENZTROPINE MESYLATE 1 MG PO TABS
1.0000 mg | ORAL_TABLET | Freq: Two times a day (BID) | ORAL | Status: DC
  Filled 2020-06-05: qty 1

## 2020-06-05 MED ORDER — HYDROXYZINE HCL 25 MG PO TABS: 25.0000 mg | ORAL_TABLET | Freq: Three times a day (TID) | ORAL | Status: DC | PRN

## 2020-06-05 NOTE — Progress Notes (Signed)
ID PROGRESS NOTE  Patient had covid in early January, and it is not unusual for her PCR test to still be positive 4 wks out, but not be infectious. The CT value (35.9 of the test on 2/8) is not in the infectious range. In addition, rapid antigen test is negative. She has no symptoms. She does not need to be on airborne/contact isolation. If questions, please call.  [name] for Infectious Diseases [phone]

## 2020-06-05 NOTE — ED Notes (Signed)
Patient Alert and oriented to baseline. Stable and ambulatory to baseline. Patient verbalized understanding of the discharge instructions.  Patient belongings were taken by the patient.   

## 2020-06-05 NOTE — ED Notes (Signed)
Pt wanded by security. Belongings on counter in locker room. Pt has to two bags and items with security

## 2020-06-05 NOTE — ED Notes (Signed)
Lunch Tray Ordered @ 1046. 

## 2020-06-05 NOTE — ED Provider Notes (Signed)
Patient was seen here for suicidal ideations. Patient was medically cleared by previous staff.   Patient was assessed by Harriett Sine NP of psychiatry and feels patient is not a danger to herself or others. She can be safely discharged home. Follow-up with outpatient resources.  We will go ahead and DC patient at this time, provide her with resources.   Marcello Fennel, PA-C 06/05/20 1316    Elnora Morrison, MD 06/07/20 1158

## 2020-06-05 NOTE — Discharge Instructions (Addendum)
Seen here for suicidal ideations. Lab work and imaging looks reassuring. Please continue to take your home medications as prescribed.  Given you resources for managing her suicidal ideations as well as resources for outpatient counseling. Please review.  Come back to the emergency department if you develop chest pain, shortness of breath, severe abdominal pain, uncontrolled nausea, vomiting, diarrhea.

## 2020-06-05 NOTE — ED Notes (Signed)
Pt sleeping can not obtain VS @ this time

## 2020-06-05 NOTE — Consult Note (Signed)
Telepsych Consultation   Location of Patient: [facility] Location of Provider: [facility]  Patient Identification: [name] MRN:  [id number] Principal Diagnosis: Malingering Diagnosis:  Principal Problem:   Malingering Active Problems:   Borderline intellectual functioning   Bipolar I disorder, most recent episode depressed ([place])   Total Time spent with patient: 15 minutes   HPI:  Reassessment: Patient seen via telepsych. Chart reviewed. [name] is a 30 year old female with history of bipolar disorder, cognitive deficits, and extensive history of malingering, with 12 ED visits in 2022. She presented to the [facility] yesterday reporting SI again to overdose on her medications. She admitted to seeing her ACT team yesterday but denied telling them about SI. She was recommended for overnight observation at the [facility] but then was transferred to [facility] due to being COVID positive.   On assessment today, patient is calm and cooperative. She reports that she presented to the [facility] for depression, anxiety, and suicidal thoughts. She declines to elaborate on triggers but is requesting admission "to be in the hospital for five days." She states that she wishes to be monitored 24/7 so that medication adjustments can be made. She denies any HI/AVH. She shows no signs of responding to internal stimuli. I discussed with her that due to chronic nature of her SI with frequent ED presentations, she does not meet inpatient criteria. I also discussed with her based on my chart review, Cymbalta was started five days ago, and we reviewed that it typically takes at least two weeks for antidepressants to show therapeutic effect. She reports compliance with Cymbalta and other medications. I also strongly encouraged her to discuss her continuing symptoms with her ACT team, as they are there to help her. Patient states understanding, is not expressing SI at this time, and states that if she starts feeling  suicidal again at home, she can call her ACT team for help. With patient's expressed consent, I called [facility] and provided them with update on patient symptoms and disposition, and they stated they will be visiting with her at home.  Per TTS assessment: Patient is a 30 year old female presenting voluntarily to [facility] via [facility] with a chief complaint of increased anxiety and suicidal ideation. Patient is well known to [facility] service line due to numerous encounters. She has history of misusing ED and [facility] services. Patient has already had 10 ED/[facility] visits in 2022 with the same complaint. Patient reports "my nerves are so bad I want to hurt myself." Patient reports SI with a plan to overdose. She reports "I'm going to take a bigger overdose than last time. I have gabapentin and hydroxyzine that yall gave me." Patient denies HI/AVH. She has ACTT services with [facility] and states she is compliant with medications. Patient potentially seeking in patient treatment for secondary gain.  Disposition: Patient shows no evidence of acute risk of harm to self or others and is psych cleared for discharge to follow up with her ACT team. ED staff updated.  Past Psychiatric History: See above  Risk to Self:   Risk to Others:   Prior Inpatient Therapy:   Prior Outpatient Therapy:    Past Medical History:  Past Medical History:  Diagnosis Date  . Anxiety   . Bipolar 1 disorder ([place])   . Cognitive deficits   . Depression   . Diabetes mellitus without complication ([place])   . Hypertension   . Mental disorder   . Mental health disorder   . Obesity  Past Surgical History:  Procedure Laterality Date  . CESAREAN SECTION    . CESAREAN SECTION N/A 04/25/2013   Procedure: REPEAT CESAREAN SECTION;  Surgeon: [name], MD;  Location: [facility];  Service: Obstetrics;  Laterality: N/A;  . MASS EXCISION N/A 06/03/2012   Procedure: EXCISION MASS;  Surgeon: [name], MD;  Location: [place];  Service: ENT;  Laterality: N/A;  Excision uvula mass  . TONSILLECTOMY N/A 06/03/2012   Procedure: TONSILLECTOMY;  Surgeon: [name], MD;  Location: [place];  Service: ENT;  Laterality: N/A;  . TONSILLECTOMY     Family History:  Family History  Problem Relation Age of Onset  . Hypertension Mother   . Diabetes Father    Family Psychiatric  History: Unknown Social History:  Social History   Substance and Sexual Activity  Alcohol Use Not Currently   Comment: occ: last intake 4 mts ago     Social History   Substance and Sexual Activity  Drug Use Not Currently  . Types: "Crack" cocaine, Other-see comments   Comment: Patient reports hx of smoking Crack    Social History   Socioeconomic History  . Marital status: Single    Spouse name: Not on file  . Number of children: Not on file  . Years of education: Not on file  . Highest education level: Not on file  Occupational History  . Not on file  Tobacco Use  . Smoking status: Current Every Day Smoker    Packs/day: 4.00    Years: 11.00    Pack years: 44.00    Types: Cigarettes, Cigars  . Smokeless tobacco: Never Used  . Tobacco comment: Pt declined  Vaping Use  . Vaping Use: Never used  Substance and Sexual Activity  . Alcohol use: Not Currently    Comment: occ: last intake 4 mts ago  . Drug use: Not Currently    Types: "Crack" cocaine, Other-see comments    Comment: Patient reports hx of smoking Crack  . Sexual activity: Yes    Birth control/protection: Implant  Other Topics Concern  . Not on file  Social History Narrative   ** Merged History Encounter **       Social Determinants of Health   Financial Resource Strain: Not on file  Food Insecurity: Not on file  Transportation Needs: Not on file  Physical Activity: Not on file  Stress: Not on file  Social Connections: Not on file   Additional Social History:    Allergies:   Allergies  Allergen Reactions  .  Wellbutrin [Bupropion] Shortness Of Breath  . Omnipaque [Iohexol] Swelling and Other (See Comments)    Reaction:  Eye swelling  . Penicillins Hives and Other (See Comments)    Has patient had a PCN reaction causing immediate rash, facial/tongue/throat swelling, SOB or lightheadedness with hypotension: Unknown Has patient had a PCN reaction causing severe rash involving mucus membranes or skin necrosis: Yes Has patient had a PCN reaction that required hospitalization Unknown Has patient had a PCN reaction occurring within the last 10 years: No If all of the above answers are "NO", then may proceed with Cephalosporin use.  Herma Mering [Benztropine]     Make pt feel crazy  . Depakote Er [Divalproex Sodium Er] Nausea And Vomiting    Labs:  Results for orders placed or performed during the hospital encounter of 06/04/20 (from the past 48 hour(s))  Comprehensive metabolic panel     Status: None   Collection  Time: 06/04/20  8:48 PM  Result Value Ref Range   Sodium 140 135 - 145 mmol/L   Potassium 3.6 3.5 - 5.1 mmol/L   Chloride 105 98 - 111 mmol/L   CO2 24 22 - 32 mmol/L   Glucose, Bld 89 70 - 99 mg/dL    Comment: Glucose reference range applies only to samples taken after fasting for at least 8 hours.   BUN 7 6 - 20 mg/dL   Creatinine, Ser 0.80 0.44 - 1.00 mg/dL   Calcium 9.4 8.9 - 10.3 mg/dL   Total Protein 7.3 6.5 - 8.1 g/dL   Albumin 4.0 3.5 - 5.0 g/dL   AST 30 15 - 41 U/L   ALT 26 0 - 44 U/L   Alkaline Phosphatase 41 38 - 126 U/L   Total Bilirubin 0.9 0.3 - 1.2 mg/dL   GFR, Estimated >60 >60 mL/min    Comment: (NOTE) Calculated using the CKD-EPI Creatinine Equation (2021)    Anion gap 11 5 - 15    Comment: Performed at [facility] [address], [place], [place] [address]  Ethanol     Status: None   Collection Time: 06/04/20  8:48 PM  Result Value Ref Range   Alcohol, Ethyl (B) <10 <10 mg/dL    Comment: (NOTE) Lowest detectable limit for serum alcohol is 10  mg/dL.  For medical purposes only. Performed at [facility], [place] [address] [place], [place] [address]   Salicylate level     Status: Abnormal   Collection Time: 06/04/20  8:48 PM  Result Value Ref Range   Salicylate Lvl <5.9 (L) 7.0 - 30.0 mg/dL    Comment: Performed at [facility] [address] [place], [place] [address]  Acetaminophen level     Status: Abnormal   Collection Time: 06/04/20  8:48 PM  Result Value Ref Range   Acetaminophen (Tylenol), Serum <10 (L) 10 - 30 ug/mL    Comment: (NOTE) Therapeutic concentrations vary significantly. A range of 10-30 ug/mL  may be an effective concentration for many patients. However, some  are best treated at concentrations outside of this range. Acetaminophen concentrations >150 ug/mL at 4 hours after ingestion  and >50 ug/mL at 12 hours after ingestion are often associated with  toxic reactions.  Performed at [facility], [place] [address], [place], [place] [address]   cbc     Status: Abnormal   Collection Time: 06/04/20  8:48 PM  Result Value Ref Range   WBC 12.0 (H) 4.0 - 10.5 K/uL   RBC 4.87 3.87 - 5.11 MIL/uL   Hemoglobin 12.2 12.0 - 15.0 g/dL   HCT 37.8 36.0 - 46.0 %   MCV 77.6 (L) 80.0 - 100.0 fL   MCH 25.1 (L) 26.0 - 34.0 pg   MCHC 32.3 30.0 - 36.0 g/dL   RDW 16.1 (H) 11.5 - 15.5 %   Platelets 296 150 - 400 K/uL   nRBC 0.0 0.0 - 0.2 %    Comment: Performed at [facility] [address], [place], [place] [address]  I-Stat beta hCG blood, ED     Status: None   Collection Time: 06/04/20  9:24 PM  Result Value Ref Range   I-stat hCG, quantitative <5.0 <5 mIU/mL   Comment 3            Comment:   GEST. AGE      CONC.  (mIU/mL)   <=1 WEEK  5 - 50     2 WEEKS       50 - 500     3 WEEKS       100 - 10,000     4 WEEKS     1,000 - 30,000        FEMALE AND NON-PREGNANT FEMALE:     LESS THAN 5 mIU/mL   POC CBG, ED     Status: Abnormal   Collection Time: 06/05/20  9:20 AM  Result Value Ref  Range   Glucose-Capillary 139 (H) 70 - 99 mg/dL    Comment: Glucose reference range applies only to samples taken after fasting for at least 8 hours.    Medications:  Current Facility-Administered Medications  Medication Dose Route Frequency Provider Last Rate Last Admin  . acetaminophen (TYLENOL) tablet 650 mg  650 mg Oral Q4H PRN Petrucelli, Samantha R, PA-C      . alum & mag hydroxide-simeth (MAALOX/MYLANTA) 200-200-20 MG/5ML suspension 30 mL  30 mL Oral Q6H PRN Petrucelli, Samantha R, PA-C      . benztropine (COGENTIN) tablet 1 mg  1 mg Oral BID Petrucelli, Samantha R, PA-C      . cholecalciferol (VITAMIN D3) tablet 1,000 Units  1,000 Units Oral Daily Petrucelli, Samantha R, PA-C   1,000 Units at 06/05/20 0928  . DULoxetine (CYMBALTA) DR capsule 60 mg  60 mg Oral Daily Petrucelli, Samantha R, PA-C   60 mg at 06/05/20 0928  . haloperidol (HALDOL) tablet 10 mg  10 mg Oral QHS Petrucelli, Samantha R, PA-C   10 mg at 06/05/20 0250  . hydrOXYzine (ATARAX/VISTARIL) tablet 25 mg  25 mg Oral TID PRN Petrucelli, Samantha R, PA-C      . insulin detemir (LEVEMIR) injection 40 Units  40 Units Subcutaneous BID Petrucelli, Samantha R, PA-C   40 Units at 06/05/20 0928  . lithium carbonate (LITHOBID) CR tablet 600 mg  600 mg Oral Q12H Petrucelli, Samantha R, PA-C   600 mg at 06/05/20 0928  . nicotine (NICODERM CQ - dosed in mg/24 hours) patch 21 mg  21 mg Transdermal Daily Petrucelli, Samantha R, PA-C      . ondansetron (ZOFRAN-ODT) disintegrating tablet 4 mg  4 mg Oral Q8H PRN Petrucelli, Samantha R, PA-C      . pantoprazole (PROTONIX) EC tablet 40 mg  40 mg Oral Daily Petrucelli, Samantha R, PA-C   40 mg at 06/05/20 0928  . propranolol (INDERAL) tablet 10 mg  10 mg Oral BID Petrucelli, Samantha R, PA-C      . simvastatin (ZOCOR) tablet 20 mg  20 mg Oral QHS Petrucelli, Samantha R, PA-C      . vitamin B-12 (CYANOCOBALAMIN) tablet 1,000 mcg  1,000 mcg Oral Daily Petrucelli, Samantha R, PA-C   1,000 mcg at  06/05/20 6045   Current Outpatient Medications  Medication Sig Dispense Refill  . amLODipine (NORVASC) 5 MG tablet Take 5 mg by mouth daily.    . benztropine (COGENTIN) 1 MG tablet Take 1 mg by mouth 2 (two) times daily.    . cholecalciferol (VITAMIN D) 25 MCG (1000 UNIT) tablet Take 1,000 Units by mouth daily.    . DULoxetine (CYMBALTA) 60 MG capsule Take 1 capsule (60 mg total) by mouth daily.  3  . glipiZIDE (GLUCOTROL XL) 10 MG 24 hr tablet Take 10 mg by mouth once.    . haloperidol (HALDOL) 10 MG tablet Take 10 mg by mouth at bedtime.    . haloperidol decanoate (HALDOL DECANOATE)  50 MG/ML injection Inject 50 mg into the muscle every 28 (twenty-eight) days.    . hydrOXYzine (ATARAX/VISTARIL) 25 MG tablet Take 1 tablet (25 mg total) by mouth 3 (three) times daily as needed for anxiety. 30 tablet 0  . insulin detemir (LEVEMIR) 100 UNIT/ML injection Inject 0.4 mLs (40 Units total) into the skin 2 (two) times daily. 10 mL 11  . lithium carbonate (LITHOBID) 300 MG CR tablet Take 2 tablets (600 mg total) by mouth every 12 (twelve) hours. (Patient taking differently: Take 300 mg by mouth 3 (three) times daily.) 120 tablet 0  . omeprazole (PRILOSEC) 20 MG capsule Take 20 mg by mouth daily.    . ondansetron (ZOFRAN ODT) 4 MG disintegrating tablet Take 1 tablet (4 mg total) by mouth every 8 (eight) hours as needed for nausea or vomiting. 20 tablet 0  . propranolol (INDERAL) 10 MG tablet Take 1 tablet (10 mg total) by mouth 2 (two) times daily. 60 tablet 0  . simvastatin (ZOCOR) 20 MG tablet Take 20 mg by mouth at bedtime.    . vitamin B-12 (CYANOCOBALAMIN) 1000 MCG tablet Take 1,000 mcg by mouth daily.      Psychiatric Specialty Exam: Physical Exam  Review of Systems  Blood pressure (!) 119/58, pulse 70, temperature 98.3 F (36.8 C), temperature source Oral, resp. rate 18, SpO2 97 %.There is no height or weight on file to calculate BMI.  General Appearance: Casual  Eye Contact:  Good  Speech:   Normal Rate  Volume:  Normal  Mood:  Anxious  Affect:  Congruent  Thought Process:  Coherent and Goal Directed  Orientation:  Full (Time, Place, and Person)  Thought Content:  Logical  Suicidal Thoughts:  No  Homicidal Thoughts:  No  Memory:  Immediate;   Fair Recent;   Fair Remote;   Fair  Judgement:  Intact  Insight:  Lacking  Psychomotor Activity:  Increased  Concentration:  Concentration: Fair and Attention Span: Fair  Recall:  [employer] of Knowledge:  Fair  Language:  Fair  Akathisia:  No  Handed:  Right  AIMS (if indicated):     Assets:  Communication Skills Desire for Improvement Financial Resources/Insurance Housing Leisure Time Resilience Social Support  ADL's:  Intact  Cognition:  WNL  Sleep:       Disposition: Patient shows no evidence of acute risk of harm to self or others and is psych cleared for discharge to follow up with her ACT team. ED staff updated.  This service was provided via telemedicine using a 2-way, interactive audio and video technology with the identified patient and this Probation officer.  Connye Burkitt, NP 06/05/2020 10:53 AM

## 2020-06-05 NOTE — ED Notes (Signed)
Triage Breakfast order placed

## 2020-06-27 ENCOUNTER — Encounter (HOSPITAL_COMMUNITY): Payer: Self-pay

## 2020-06-27 ENCOUNTER — Emergency Department (HOSPITAL_COMMUNITY)
Admission: EM | Admit: 2020-06-27 | Discharge: 2020-06-27 | Disposition: A | Discharge status: Home / Self Care | Payer: Medicaid Other | Attending: Emergency Medicine | Admitting: Emergency Medicine

## 2020-06-27 ENCOUNTER — Other Ambulatory Visit: Payer: Self-pay

## 2020-06-27 DIAGNOSIS — N3001 Acute cystitis with hematuria: Secondary | ICD-10-CM | POA: Insufficient documentation

## 2020-06-27 DIAGNOSIS — Z79899 Other long term (current) drug therapy: Secondary | ICD-10-CM | POA: Diagnosis not present

## 2020-06-27 DIAGNOSIS — E1165 Type 2 diabetes mellitus with hyperglycemia: Secondary | ICD-10-CM | POA: Diagnosis not present

## 2020-06-27 DIAGNOSIS — Z7984 Long term (current) use of oral hypoglycemic drugs: Secondary | ICD-10-CM | POA: Diagnosis not present

## 2020-06-27 DIAGNOSIS — F17210 Nicotine dependence, cigarettes, uncomplicated: Secondary | ICD-10-CM | POA: Diagnosis not present

## 2020-06-27 DIAGNOSIS — N898 Other specified noninflammatory disorders of vagina: Secondary | ICD-10-CM | POA: Insufficient documentation

## 2020-06-27 DIAGNOSIS — F17290 Nicotine dependence, other tobacco product, uncomplicated: Secondary | ICD-10-CM | POA: Insufficient documentation

## 2020-06-27 DIAGNOSIS — I10 Essential (primary) hypertension: Secondary | ICD-10-CM | POA: Insufficient documentation

## 2020-06-27 DIAGNOSIS — Z794 Long term (current) use of insulin: Secondary | ICD-10-CM | POA: Insufficient documentation

## 2020-06-27 DIAGNOSIS — N390 Urinary tract infection, site not specified: Secondary | ICD-10-CM

## 2020-06-27 DIAGNOSIS — R102 Pelvic and perineal pain: Secondary | ICD-10-CM | POA: Diagnosis present

## 2020-06-27 DIAGNOSIS — E1142 Type 2 diabetes mellitus with diabetic polyneuropathy: Secondary | ICD-10-CM | POA: Diagnosis not present

## 2020-06-27 LAB — URINALYSIS, ROUTINE W REFLEX MICROSCOPIC
Bilirubin Urine: NEGATIVE
Glucose, UA: >=500 mg/dL — AB
Ketones, ur: NEGATIVE mg/dL
Nitrite: NEGATIVE
Protein, ur: 30 mg/dL — AB
Specific Gravity, Urine: 1.024 (ref 1.005–1.030)
pH: 5 (ref 5.0–8.0)

## 2020-06-27 LAB — WET PREP, GENITAL
Clue Cells Wet Prep HPF POC: NONE SEEN
Sperm: NONE SEEN
Trich, Wet Prep: NONE SEEN
Yeast Wet Prep HPF POC: NONE SEEN

## 2020-06-27 LAB — PREGNANCY, URINE: Preg Test, Ur: NEGATIVE

## 2020-06-27 MED ORDER — CEPHALEXIN 500 MG PO CAPS: 500.0000 mg | ORAL_CAPSULE | Freq: Two times a day (BID) | ORAL | 0 refills | Status: AC

## 2020-06-27 NOTE — ED Triage Notes (Signed)
Patient c/o vaginal itching and burning x 5-6 days ago.

## 2020-06-27 NOTE — Discharge Instructions (Addendum)
Take antibiotics as directed. Please take all of your antibiotics until finished.  As we discussed, you can follow-up with OB/GYN for continued vaginal issues.  You have STD test pending.  If you are positive, they will notify you.  If you have not heard anything, you most likely negative.  You can check online or on the [facility] app regarding your results.  If you are positive, you will need to get treated.  Return emergency department for any vaginal bleeding, vaginal discharge, abdominal pain, fevers, nausea/vomiting or any other worsening concerning symptoms.

## 2020-06-27 NOTE — ED Provider Notes (Signed)
[place] DEPT Provider Note   CSN: [id number] Arrival date & time: 06/27/20  1049     History Chief Complaint  Patient presents with  . Vaginal Pain    [name] is a 30 y.o. female past with history of anxiety, bipolar, diabetes, hypertension who presents for evaluation of vaginal itching and burning that has been ongoing for the last several days.  She feels like her vaginal area is very dry which causes it to itch.  She states she has noticed some tiny bumps and she feels like the area is swollen.  She has not noted any vaginal discharge, vaginal bleeding.  She does report that about 2 days ago, she had some dysuria.  No hematuria.  No abdominal pain.  She states she is currently not sexually active.  The history is provided by the patient.       Past Medical History:  Diagnosis Date  . Anxiety   . Bipolar 1 disorder ([place])   . Cognitive deficits   . Depression   . Diabetes mellitus without complication ([place])   . Hypertension   . Mental disorder   . Mental health disorder   . Obesity     Patient Active Problem List   Diagnosis Date Noted  . Malingering 06/05/2020  . Gastroesophageal reflux disease 05/04/2020  . Hyperglycemia due to type 2 diabetes mellitus ([place]) 05/04/2020  . Long term (current) use of insulin ([place]) 05/04/2020  . Migraine without aura 05/04/2020  . Morbid obesity ([place]) 05/04/2020  . 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 2 diabetes mellitus ([place]) 05/04/2020  . Prolapsed internal hemorrhoids 05/04/2020  . Vitamin D deficiency 05/04/2020  . Other symptoms and signs involving cognitive functions and awareness 05/04/2020  . Suicide attempt ([place])   . Anxiety state 03/06/2020  . Schizophrenia ([place]) 09/13/2019  . Bipolar I disorder, most recent episode depressed ([place]) 06/23/2019  . MDD (major depressive disorder) 10/10/2018  . Schizoaffective disorder, bipolar type ([place]) 09/25/2018  . Affective psychosis, bipolar ([place])  06/13/2018  . HTN (hypertension) 05/03/2018  . Tobacco use disorder 05/03/2018  . Adjustment disorder with emotional disturbance 01/02/2018  . 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 Moderate bipolar I disorder, most recent episode depressed ([place])   . Psychosis ([place])   . 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 OCD (obsessive compulsive disorder) 10/05/2016  . Major depressive disorder, recurrent episode, mild ([place]) 05/04/2016  . 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 Learning disability 07/18/2015  . 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 Diabetes mellitus ([place]) 07/18/2015  . 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 Hyperlipidemia 07/18/2015  . Severe episode of recurrent major depressive disorder, without psychotic features ([place])   . Suicidal ideation   . Drug overdose   . Cognitive deficits 10/12/2012  . Generalized anxiety disorder 06/28/2012    Past Surgical History:  Procedure Laterality Date  . CESAREAN SECTION    . CESAREAN SECTION N/A 04/25/2013   Procedure: REPEAT CESAREAN SECTION;  Surgeon: [name], MD;  Location: [facility];  Service: Obstetrics;  Laterality: N/A;  . MASS EXCISION N/A 06/03/2012   Procedure: EXCISION MASS;  Surgeon: [name], MD;  Location: [facility];  Service: ENT;  Laterality: N/A;  Excision uvula mass  . TONSILLECTOMY N/A 06/03/2012   Procedure: TONSILLECTOMY;  Surgeon: [name], MD;  Location: [facility];  Service: ENT;  Laterality: N/A;  .  TONSILLECTOMY       OB History    Gravida  3   Para  3   Term  3   Preterm  0   AB  0   Living  3     SAB  0   IAB  0   Ectopic  0   Multiple      Live Births  3           Family History  Problem Relation Age of Onset  . Hypertension Mother   . Diabetes Father     Social History   Tobacco Use  .  Smoking status: Current Every Day Smoker    Packs/day: 4.00    Years: 11.00    Pack years: 44.00    Types: Cigarettes, Cigars  . Smokeless tobacco: Never Used  . Tobacco comment: Pt declined  Vaping Use  . Vaping Use: Never used  Substance Use Topics  . Alcohol use: Not Currently    Comment: occ: last intake 4 mts ago  . Drug use: Not Currently    Types: "Crack" cocaine, Other-see comments    Comment: Patient reports hx of smoking Crack    Home Medications Prior to Admission medications   Medication Sig Start Date End Date Taking? Authorizing Provider  cephALEXin (KEFLEX) 500 MG capsule Take 1 capsule (500 mg total) by mouth 2 (two) times daily for 5 days. 06/27/20 07/02/20 Yes Providence Lanius A, PA-C  amLODipine (NORVASC) 5 MG tablet Take 5 mg by mouth daily.    [provider]  benztropine (COGENTIN) 1 MG tablet Take 1 mg by mouth 2 (two) times daily. 05/01/20   [provider]  cholecalciferol (VITAMIN D) 25 MCG (1000 UNIT) tablet Take 1,000 Units by mouth daily. 04/30/20   [provider]  DULoxetine (CYMBALTA) 60 MG capsule Take 1 capsule (60 mg total) by mouth daily. 05/31/20   Money, Lowry Ram, FNP  glipiZIDE (GLUCOTROL XL) 10 MG 24 hr tablet Take 10 mg by mouth once.    [provider]  haloperidol (HALDOL) 10 MG tablet Take 10 mg by mouth at bedtime. 05/09/20   [provider]  haloperidol decanoate (HALDOL DECANOATE) 50 MG/ML injection Inject 50 mg into the muscle every 28 (twenty-eight) days. 03/19/20   [provider]  hydrOXYzine (ATARAX/VISTARIL) 25 MG tablet Take 1 tablet (25 mg total) by mouth 3 (three) times daily as needed for anxiety. 05/27/20   Derrill Center, NP  insulin detemir (LEVEMIR) 100 UNIT/ML injection Inject 0.4 mLs (40 Units total) into the skin 2 (two) times daily. 05/05/20   Montine Circle, PA-C  lithium carbonate (LITHOBID) 300 MG CR tablet Take 2 tablets (600 mg total) by mouth every 12 (twelve) hours. Patient  taking differently: Take 300 mg by mouth 3 (three) times daily. 05/05/20 06/04/20  Montine Circle, PA-C  omeprazole (PRILOSEC) 20 MG capsule Take 20 mg by mouth daily. 04/30/20   [provider]  ondansetron (ZOFRAN ODT) 4 MG disintegrating tablet Take 1 tablet (4 mg total) by mouth every 8 (eight) hours as needed for nausea or vomiting. 05/05/20   Montine Circle, PA-C  propranolol (INDERAL) 10 MG tablet Take 1 tablet (10 mg total) by mouth 2 (two) times daily. 05/05/20 06/04/20  Montine Circle, PA-C  simvastatin (ZOCOR) 20 MG tablet Take 20 mg by mouth at bedtime. 08/21/19   [provider]  vitamin B-12 (CYANOCOBALAMIN) 1000 MCG tablet Take 1,000 mcg by mouth daily. 11/13/19   [provider]    Allergies    Wellbutrin [bupropion], Omnipaque [iohexol], Penicillins, Cogentin [benztropine], and Depakote er [divalproex sodium er]  Review of Systems   Review of Systems  Gastrointestinal: Negative for abdominal pain.  Genitourinary: Positive for dysuria and vaginal pain. Negative for hematuria, vaginal bleeding and vaginal discharge.  All other systems reviewed and are negative.   Physical Exam Updated Vital Signs BP (!) 145/92 (BP Location: Left Arm)   Pulse 90   Temp 98.3 F (36.8 C) (Oral)   Resp 18   Ht 5\' 7"  (1.702 m)   Wt 113.4 kg   LMP 06/25/2020   SpO2 98%   BMI 39.16 kg/m   Physical Exam Vitals and nursing note reviewed. Exam conducted with a chaperone present.  Constitutional:      Appearance: She is well-developed and well-nourished.  HENT:     Head: Normocephalic and atraumatic.  Eyes:     General: No scleral icterus.       Right eye: No discharge.        Left eye: No discharge.     Extraocular Movements: EOM normal.     Conjunctiva/sclera: Conjunctivae normal.  Pulmonary:     Effort: Pulmonary effort is normal.  Abdominal:     Tenderness: There is no abdominal tenderness.     Comments: Abdomen is soft, non-distended, non-tender. No  rigidity, No guarding. No peritoneal signs.  Genitourinary:    Cervix: No cervical motion tenderness.     Adnexa:        Right: No mass or tenderness.         Left: No mass or tenderness.       Comments: The exam was performed with a chaperone present Levada Dy, Therapist, sports). Normal external female genitalia. No lesions, rash, or sores.  No warmth, erythema, edema noted to labia.  No evidence of abscess.  No vesicles.  No discharge noted.  No CMT.  No adnexal mass or tenderness noted bilaterally. Skin:    General: Skin is warm and dry.  Neurological:     Mental Status: She is alert.  Psychiatric:        Mood and Affect: Mood and affect normal.        Speech: Speech normal.        Behavior: Behavior normal.     ED Results / Procedures / Treatments   Labs (all labs ordered are listed, but only abnormal results are displayed) Labs Reviewed  WET PREP, GENITAL - Abnormal; Notable for the following components:      Result Value   WBC, Wet Prep HPF POC MANY (*)    All other components within normal limits  URINALYSIS, ROUTINE W REFLEX MICROSCOPIC - Abnormal; Notable for the following components:   APPearance CLOUDY (*)    Glucose, UA >=500 (*)    Hgb urine dipstick SMALL (*)    Protein, ur 30 (*)    Leukocytes,Ua SMALL (*)    Bacteria, UA RARE (*)    All other components within normal limits  PREGNANCY, URINE  GC/CHLAMYDIA PROBE AMP ([place]) NOT AT [facility]    EKG None  Radiology No results found.  Procedures Procedures   Medications Ordered in ED Medications - No data to display  ED Course  I have reviewed the triage vital signs and the nursing notes.  Pertinent labs & imaging results that were available during my care of the patient were reviewed by me and considered in my medical decision making (see chart for  details).    MDM Rules/Calculators/A&P                          30 year old female who presents for evaluation of vaginal irritation.  She also has had some dysuria.   No vaginal bleeding, vaginal discharge.  She is not currently sexually active.  On initial arrival, she is afebrile nontoxic-appearing.  Vital signs are stable.  Abdominal exam is benign.  We will plan for urine, pelvic exam.  History/physical exam not concerning for ovarian torsion.  Pelvic exam as documented above.  No CMT, no adnexal mass or tenderness noted bilaterally.  No evidence of discharge.  Pelvic exam not concerning for PID.  UA shows hematuria, leukocytes, pyuria, bacteria.  Given that she symptomatic, will plan to treat.  Urine pregnancy negative.  Wet prep negative.  Discussed results with patient.  She is allergic to penicillins but has tolerated cephalosporins in the past.  Encouraged at home supportive care measures. At this time, patient exhibits no emergent life-threatening condition that require further evaluation in ED. Patient had ample opportunity for questions and discussion. All patient's questions were answered with full understanding. Strict return precautions discussed. Patient expresses understanding and agreement to plan.   Portions of this note were generated with Lobbyist. Dictation errors may occur despite best attempts at proofreading.  Final Clinical Impression(s) / ED Diagnoses Final diagnoses:  Vaginal irritation  Urinary tract infection with hematuria, site unspecified    Rx / DC Orders ED Discharge Orders         Ordered    cephALEXin (KEFLEX) 500 MG capsule  2 times daily        06/27/20 1323           Desma Mcgregor 06/27/20 1435    Lajean Saver, MD 06/28/20 1320

## 2020-06-28 LAB — GC/CHLAMYDIA PROBE AMP (~~LOC~~) NOT AT ARMC
Chlamydia: NEGATIVE
Comment: NEGATIVE
Comment: NORMAL
Neisseria Gonorrhea: NEGATIVE

## 2020-07-03 ENCOUNTER — Encounter (HOSPITAL_COMMUNITY): Payer: Self-pay | Admitting: Emergency Medicine

## 2020-07-03 ENCOUNTER — Emergency Department (HOSPITAL_COMMUNITY)
Admission: EM | Admit: 2020-07-03 | Discharge: 2020-07-03 | Disposition: A | Discharge status: Home / Self Care | Payer: Medicaid Other | Attending: Emergency Medicine | Admitting: Emergency Medicine

## 2020-07-03 ENCOUNTER — Other Ambulatory Visit: Payer: Self-pay

## 2020-07-03 DIAGNOSIS — N898 Other specified noninflammatory disorders of vagina: Secondary | ICD-10-CM

## 2020-07-03 DIAGNOSIS — Z7984 Long term (current) use of oral hypoglycemic drugs: Secondary | ICD-10-CM | POA: Diagnosis not present

## 2020-07-03 DIAGNOSIS — F17210 Nicotine dependence, cigarettes, uncomplicated: Secondary | ICD-10-CM | POA: Diagnosis not present

## 2020-07-03 DIAGNOSIS — Z79899 Other long term (current) drug therapy: Secondary | ICD-10-CM | POA: Diagnosis not present

## 2020-07-03 DIAGNOSIS — E1142 Type 2 diabetes mellitus with diabetic polyneuropathy: Secondary | ICD-10-CM | POA: Insufficient documentation

## 2020-07-03 DIAGNOSIS — I10 Essential (primary) hypertension: Secondary | ICD-10-CM | POA: Diagnosis not present

## 2020-07-03 DIAGNOSIS — Z794 Long term (current) use of insulin: Secondary | ICD-10-CM | POA: Insufficient documentation

## 2020-07-03 LAB — CBG MONITORING, ED: Glucose-Capillary: 362 mg/dL — ABNORMAL HIGH (ref 70–99)

## 2020-07-03 MED ORDER — FLUCONAZOLE 150 MG PO TABS
150.0000 mg | ORAL_TABLET | Freq: Once | ORAL | Status: AC
  Administered 2020-07-03: 150 mg via ORAL
  Filled 2020-07-03: qty 1

## 2020-07-03 MED ORDER — FLUCONAZOLE 150 MG PO TABS: 150.0000 mg | ORAL_TABLET | Freq: Once | ORAL | 0 refills | Status: DC

## 2020-07-03 NOTE — Progress Notes (Signed)
..   Transition of Care [facility]) - Emergency Department Mini Assessment   Patient Details  Name: [name] MRN: [id number] Date of Birth: [birth year]  Transition of Care [facility]) CM/SW Contact:    Ricquita C Tarpley-Carter, [place] Phone Number: 07/03/2020, 10:40 AM   Clinical Narrative: TOC CM/CSW was ask to consult with pt.  Prior to [facility] arrival pt was presented with a bus pass by [place], Therapist, sports and pt accepted.  Pt has no other needs at this time.    Ricquita Tarpley-Carter, MSW, LCSW-A Pronouns:  She, Her, [place] ED Transitions of CareClinical Social Worker [email or web address] [phone]   ED Mini Assessment: What brought you to the Emergency Department? : Vaginal Itching  Barriers to Discharge: No Barriers Identified     Means of departure: Public Transportation  Interventions which prevented an admission or readmission: Transportation Screening    Patient Contact and Communications       Contact Date: 07/03/20,          Patient states their goals for this hospitalization and ongoing recovery are:: To obtain transportation   Choice offered to / list presented to : NA  Admission diagnosis:  vaginal pain;rash Patient Active Problem List   Diagnosis Date Noted  . Malingering 06/05/2020  . Gastroesophageal reflux disease 05/04/2020  . Hyperglycemia due to type 2 diabetes mellitus ([place]) 05/04/2020  . Long term (current) use of insulin ([place]) 05/04/2020  . Migraine without aura 05/04/2020  . Morbid obesity ([place]) 05/04/2020  . 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 2 diabetes mellitus ([place]) 05/04/2020  . Prolapsed internal hemorrhoids 05/04/2020  . Vitamin D deficiency 05/04/2020  . Other symptoms and signs involving cognitive functions and awareness 05/04/2020  . Suicide attempt ([place])   . Anxiety state 03/06/2020  . Schizophrenia ([place]) 09/13/2019  . Bipolar I disorder, most recent episode depressed ([place]) 06/23/2019  . MDD  (major depressive disorder) 10/10/2018  . Schizoaffective disorder, bipolar type ([place]) 09/25/2018  . Affective psychosis, bipolar ([place]) 06/13/2018  . HTN (hypertension) 05/03/2018  . Tobacco use disorder 05/03/2018  . Adjustment disorder with emotional disturbance 01/02/2018  . 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 Moderate bipolar I disorder, most recent episode depressed ([place])   . Psychosis ([place])   . 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 OCD (obsessive compulsive disorder) 10/05/2016  . Major depressive disorder, recurrent episode, mild ([place]) 05/04/2016  . 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 Learning disability 07/18/2015  . 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 Diabetes mellitus ([place]) 07/18/2015  . 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 Hyperlipidemia 07/18/2015  . Severe episode of recurrent major depressive disorder, without psychotic features ([place])   . Suicidal ideation   . Drug overdose   . Cognitive deficits 10/12/2012  . Generalized anxiety disorder 06/28/2012   PCP:  Trey Sailors, PA Pharmacy:   [facility] [place], [place] - [address] [place] [address] Phone: [phone] Fax: [phone]  [place], [place] - [address] [place] [address] Phone: [phone] Fax: [place], [place] [address] Phone: [phone] Fax: [phone]

## 2020-07-03 NOTE — Discharge Instructions (Addendum)
  You were given a dose of antifungal medication called fluconazole here in the ED.  If your symptoms are not better in 3 days, take the second dose of fluconazole sent to your pharmacy.  Follow-up with the OB/GYN doctor.  They will need to see you for this problem.  Call the office to make an appointment.

## 2020-07-03 NOTE — ED Provider Notes (Addendum)
[facility] DEPT Provider Note   CSN: [id number] Arrival date & time: 07/03/20  7654     History Chief Complaint  Patient presents with  . Vaginal Itching    [name] is a 30 y.o. female.  HPI      [name] is a 30 y.o. female, with a history of bipolar, cognitive deficits, DM, HTN, presenting to the ED with vulvar irritation and some vaginal discharge for at least the last 2 weeks.  Endorses itching and irritation to both the vagina and labia. She is not sexually active. She was seen in the ED for similar symptoms March 3, diagnosed with UTI, and prescribed Keflex.  She states she finished this medication and her vaginal and labial symptoms persisted.   She now states her symptoms feel like previous yeast infections. Denies fever/chills, abdominal pain, dysuria, hematuria, abnormal vaginal bleeding, or any other complaints.   Past Medical History:  Diagnosis Date  . Anxiety   . Bipolar 1 disorder ([place])   . Cognitive deficits   . Depression   . Diabetes mellitus without complication ([place])   . Hypertension   . Mental disorder   . Mental health disorder   . Obesity     Patient Active Problem List   Diagnosis Date Noted  . Malingering 06/05/2020  . Gastroesophageal reflux disease 05/04/2020  . Hyperglycemia due to type 2 diabetes mellitus ([place]) 05/04/2020  . Long term (current) use of insulin ([place]) 05/04/2020  . Migraine without aura 05/04/2020  . Morbid obesity ([place]) 05/04/2020  . 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 2 diabetes mellitus ([place]) 05/04/2020  . Prolapsed internal hemorrhoids 05/04/2020  . Vitamin D deficiency 05/04/2020  . Other symptoms and signs involving cognitive functions and awareness 05/04/2020  . Suicide attempt ([place])   . Anxiety state 03/06/2020  . Schizophrenia ([place]) 09/13/2019  . Bipolar I disorder, most recent episode depressed ([place]) 06/23/2019  . MDD (major depressive disorder) 10/10/2018  .  Schizoaffective disorder, bipolar type ([place]) 09/25/2018  . Affective psychosis, bipolar ([place]) 06/13/2018  . HTN (hypertension) 05/03/2018  . Tobacco use disorder 05/03/2018  . Adjustment disorder with emotional disturbance 01/02/2018  . 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 Moderate bipolar I disorder, most recent episode depressed ([place])   . Psychosis ([place])   . 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 OCD (obsessive compulsive disorder) 10/05/2016  . Major depressive disorder, recurrent episode, mild ([place]) 05/04/2016  . 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 Learning disability 07/18/2015  . 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 Diabetes mellitus ([place]) 07/18/2015  . 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 Hyperlipidemia 07/18/2015  . Severe episode of recurrent major depressive disorder, without psychotic features ([place])   . Suicidal ideation   . Drug overdose   . Cognitive deficits 10/12/2012  . Generalized anxiety disorder 06/28/2012    Past Surgical History:  Procedure Laterality Date  . CESAREAN SECTION    . CESAREAN SECTION N/A 04/25/2013   Procedure: REPEAT CESAREAN SECTION;  Surgeon: [name], MD;  Location: [facility];  Service: Obstetrics;  Laterality: N/A;  . MASS EXCISION N/A 06/03/2012   Procedure: EXCISION MASS;  Surgeon: [name], MD;  Location: [facility];  Service: ENT;  Laterality: N/A;  Excision uvula mass  . TONSILLECTOMY N/A 06/03/2012   Procedure: TONSILLECTOMY;  Surgeon: [name], MD;  Location: [facility];  Service:  ENT;  Laterality: N/A;  . TONSILLECTOMY       OB History    Gravida  3   Para  3   Term  3   Preterm  0   AB  0   Living  3     SAB  0   IAB  0   Ectopic  0   Multiple      Live Births  3           Family History  Problem Relation Age of  Onset  . Hypertension Mother   . Diabetes Father     Social History   Tobacco Use  . Smoking status: Current Every Day Smoker    Packs/day: 4.00    Years: 11.00    Pack years: 44.00    Types: Cigarettes, Cigars  . Smokeless tobacco: Never Used  . Tobacco comment: Pt declined  Vaping Use  . Vaping Use: Never used  Substance Use Topics  . Alcohol use: Not Currently    Comment: occ: last intake 4 mts ago  . Drug use: Not Currently    Types: "Crack" cocaine, Other-see comments    Comment: Patient reports hx of smoking Crack    Home Medications Prior to Admission medications   Medication Sig Start Date End Date Taking? Authorizing Provider  fluconazole (DIFLUCAN) 150 MG tablet Take 1 tablet (150 mg total) by mouth once for 1 dose. If symptoms have not resolved after first dose. 07/06/20 07/06/20 Yes Shyra Emile C, PA-C  amLODipine (NORVASC) 5 MG tablet Take 5 mg by mouth daily.    [provider]  benztropine (COGENTIN) 1 MG tablet Take 1 mg by mouth 2 (two) times daily. 05/01/20   [provider]  cholecalciferol (VITAMIN D) 25 MCG (1000 UNIT) tablet Take 1,000 Units by mouth daily. 04/30/20   [provider]  DULoxetine (CYMBALTA) 60 MG capsule Take 1 capsule (60 mg total) by mouth daily. 05/31/20   Money, Lowry Ram, FNP  glipiZIDE (GLUCOTROL XL) 10 MG 24 hr tablet Take 10 mg by mouth once.    [provider]  haloperidol (HALDOL) 10 MG tablet Take 10 mg by mouth at bedtime. 05/09/20   [provider]  haloperidol decanoate (HALDOL DECANOATE) 50 MG/ML injection Inject 50 mg into the muscle every 28 (twenty-eight) days. 03/19/20   [provider]  hydrOXYzine (ATARAX/VISTARIL) 25 MG tablet Take 1 tablet (25 mg total) by mouth 3 (three) times daily as needed for anxiety. 05/27/20   Derrill Center, NP  insulin detemir (LEVEMIR) 100 UNIT/ML injection Inject 0.4 mLs (40 Units total) into the skin 2 (two) times daily. 05/05/20   Montine Circle,  PA-C  lithium carbonate (LITHOBID) 300 MG CR tablet Take 2 tablets (600 mg total) by mouth every 12 (twelve) hours. Patient taking differently: Take 300 mg by mouth 3 (three) times daily. 05/05/20 06/04/20  Montine Circle, PA-C  omeprazole (PRILOSEC) 20 MG capsule Take 20 mg by mouth daily. 04/30/20   [provider]  ondansetron (ZOFRAN ODT) 4 MG disintegrating tablet Take 1 tablet (4 mg total) by mouth every 8 (eight) hours as needed for nausea or vomiting. 05/05/20   Montine Circle, PA-C  propranolol (INDERAL) 10 MG tablet Take 1 tablet (10 mg total) by mouth 2 (two) times daily. 05/05/20 06/04/20  Montine Circle, PA-C  simvastatin (ZOCOR) 20 MG tablet Take 20 mg by mouth at bedtime. 08/21/19   [provider]  vitamin B-12 (CYANOCOBALAMIN) 1000 MCG  tablet Take 1,000 mcg by mouth daily. 11/13/19   [provider]    Allergies    Wellbutrin [bupropion], Omnipaque [iohexol], Penicillins, Cogentin [benztropine], and Depakote er [divalproex sodium er]  Review of Systems   Review of Systems  Constitutional: Negative for fever.  Genitourinary: Positive for vaginal discharge. Negative for difficulty urinating, dysuria, flank pain, frequency, genital sores, hematuria, pelvic pain, vaginal bleeding and vaginal pain.       Labial irritation  Musculoskeletal: Negative for back pain.  Neurological: Negative for weakness.    Physical Exam Updated Vital Signs BP 133/86 (BP Location: Left Arm)   Pulse 95   Temp 98.3 F (36.8 C) (Oral)   Resp 19   Ht 5\' 7"  (1.702 m)   Wt 113.4 kg   LMP 06/25/2020   SpO2 99%   BMI 39.16 kg/m   Physical Exam Vitals and nursing note reviewed.  Constitutional:      General: She is not in acute distress.    Appearance: She is well-developed and well-nourished. She is obese. She is not diaphoretic.  HENT:     Head: Normocephalic and atraumatic.  Eyes:     Conjunctiva/sclera: Conjunctivae normal.  Cardiovascular:     Rate and Rhythm:  Normal rate and regular rhythm.  Pulmonary:     Effort: Pulmonary effort is normal.  Abdominal:     Palpations: Abdomen is soft.     Tenderness: There is no abdominal tenderness.  Genitourinary:    Comments: Patient had some thick, white vaginal discharge noted on exam.  Increased sensitivity to palpation of the labia. No vaginal bleeding noted.  No genital sores.  No inguinal lymphadenopathy. RN, Abby, served as chaperone during exam. Musculoskeletal:     Cervical back: Neck supple.  Skin:    General: Skin is warm and dry.     Coloration: Skin is not pale.  Neurological:     Mental Status: She is alert.  Psychiatric:        Mood and Affect: Mood and affect normal.        Behavior: Behavior normal.     ED Results / Procedures / Treatments   Labs (all labs ordered are listed, but only abnormal results are displayed) Labs Reviewed  CBG MONITORING, ED - Abnormal; Notable for the following components:      Result Value   Glucose-Capillary 362 (*)    All other components within normal limits    EKG None  Radiology No results found.  Procedures Procedures   Medications Ordered in ED Medications  fluconazole (DIFLUCAN) tablet 150 mg (150 mg Oral Given 07/03/20 1001)    ED Course  I have reviewed the triage vital signs and the nursing notes.  Pertinent labs & imaging results that were available during my care of the patient were reviewed by me and considered in my medical decision making (see chart for details).    MDM Rules/Calculators/A&P                          Patient presents with continued vaginal discharge and irritation. She had been treated with an antibiotic for possible UTI without improvement in her symptoms. She preferred no repeat internal exam today. She states she did not go see OB/GYN because she does not know how to get there.  We spent quite a bit of time discussing strategies for her to get to an OB/GYN appointment. A message was also sent to the  administration group  [facility] asking them to contact the patient from their end to set up an appointment.  Consult was also placed for social work to provide any assistance they can for the patient. The patient was given instructions for home care as well as return precautions. Patient voices understanding of these instructions, accepts the plan, and is comfortable with discharge.    Final Clinical Impression(s) / ED Diagnoses Final diagnoses:  Vaginal irritation    Rx / DC Orders ED Discharge Orders         Ordered    fluconazole (DIFLUCAN) 150 MG tablet   Once        07/03/20 0959           Lorayne Bender, PA-C 07/03/20 1016    Lorayne Bender, PA-C 07/03/20 1016    Blanchie Dessert, MD 07/03/20 2120

## 2020-07-03 NOTE — ED Notes (Signed)
After giving pt antifungal medication and [place] sandwich, pt requesting to leave and requesting a bus pass. Bus pass provided to pt and PA notified. Discharge orders placed and papers printed. This RN reviewed paperwork with pt and she verbalized understanding of follow up appointment and prescription instructions.

## 2020-07-03 NOTE — ED Triage Notes (Signed)
BIBA Per EMS: Pt coming from home with complaints of vaginal itching x1 month. Pt states she has been taking an antibiotic for this but it has not improved. Pt states infection has now spread up stomach. Pt is diabetic and has not taken medication x1 day. CBG en route 429. Pt supposed to have appointment with PCP tomorrow. Vitals: 136/96 HR 118 100% RA

## 2020-07-05 ENCOUNTER — Ambulatory Visit (INDEPENDENT_AMBULATORY_CARE_PROVIDER_SITE_OTHER): Payer: Medicaid Other

## 2020-07-05 ENCOUNTER — Other Ambulatory Visit (HOSPITAL_COMMUNITY)
Admission: RE | Admit: 2020-07-05 | Discharge: 2020-07-05 | Disposition: A | Discharge status: Home / Self Care | Payer: Medicaid Other | Source: Ambulatory Visit | Attending: Family Medicine | Admitting: Family Medicine

## 2020-07-05 ENCOUNTER — Other Ambulatory Visit: Payer: Self-pay

## 2020-07-05 VITALS — BP 133/86 | Ht 67.0 in | Wt 254.5 lb

## 2020-07-05 DIAGNOSIS — N898 Other specified noninflammatory disorders of vagina: Secondary | ICD-10-CM | POA: Insufficient documentation

## 2020-07-05 DIAGNOSIS — Z113 Encounter for screening for infections with a predominantly sexual mode of transmission: Secondary | ICD-10-CM | POA: Insufficient documentation

## 2020-07-05 MED ORDER — METRONIDAZOLE 500 MG PO TABS: 500.0000 mg | ORAL_TABLET | Freq: Two times a day (BID) | ORAL | 0 refills | Status: DC

## 2020-07-05 MED ORDER — FLUCONAZOLE 150 MG PO TABS: 150.0000 mg | ORAL_TABLET | Freq: Once | ORAL | 0 refills | Status: AC

## 2020-07-05 MED ORDER — MONISTAT 1 COMBO PACK 1200 & 2 MG & % VA KIT: 1.0000 | PACK | Freq: Once | VAGINAL | 0 refills | Status: AC

## 2020-07-05 NOTE — Progress Notes (Signed)
Pt her today for Nurse visit Self Swab, she states that she is having thick discharge, with itching & burning in vagina area.Reviewed Medicines with Pt, she was Rx Diflucan for Previous yeast infection & she states that it didn't work. Spoke with Dr. Ilda Basset & he advised for Pt. until results return, to take Diflucan,Flagyl & that she can also try Monistat 7. Pt shout return in 2 weeks for TOC. Pt verbalized understanding.

## 2020-07-07 LAB — CERVICOVAGINAL ANCILLARY ONLY
Bacterial Vaginitis (gardnerella): POSITIVE — AB
Candida Glabrata: NEGATIVE
Candida Vaginitis: POSITIVE — AB
Chlamydia: NEGATIVE
Comment: NEGATIVE
Comment: NEGATIVE
Comment: NEGATIVE
Comment: NEGATIVE
Comment: NEGATIVE
Comment: NORMAL
Neisseria Gonorrhea: NEGATIVE
Trichomonas: NEGATIVE

## 2020-07-08 LAB — RPR: RPR Ser Ql: REACTIVE — AB

## 2020-07-08 LAB — RPR, QUANT+TP ABS (REFLEX)
Rapid Plasma Reagin, Quant: 1:16 {titer} — ABNORMAL HIGH
T Pallidum Abs: REACTIVE — AB

## 2020-07-08 LAB — HIV ANTIBODY (ROUTINE TESTING W REFLEX): HIV Screen 4th Generation wRfx: NONREACTIVE

## 2020-07-08 LAB — HEPATITIS B SURFACE ANTIGEN: Hepatitis B Surface Ag: NEGATIVE

## 2020-07-08 LAB — HEPATITIS C ANTIBODY: Hep C Virus Ab: <0.1 s/co ratio (ref 0.0–0.9)

## 2020-07-08 NOTE — Progress Notes (Signed)
Patient was assessed and managed by nursing staff during this encounter. I have reviewed the chart and agree with the documentation and plan. I have also made any necessary editorial changes.  Aletha Halim, MD 07/08/2020 1:40 AM

## 2020-07-09 ENCOUNTER — Telehealth: Payer: Self-pay | Admitting: Family Medicine

## 2020-07-09 MED ORDER — FLUCONAZOLE 150 MG PO TABS: 150.0000 mg | ORAL_TABLET | Freq: Once | ORAL | 0 refills | Status: AC

## 2020-07-09 NOTE — Telephone Encounter (Signed)
Patient called and want her test results.

## 2020-07-09 NOTE — Telephone Encounter (Signed)
Returned patients call. Patient reports she would like her lab results.   Patient is taking Flagyl currently. Ordered Diflucan at patient request.   Patient was informed all STD labs normal with exception of RPR. Reviewed that Dr. Ilda Basset will need to interpret and office will reach out to patient if treatment is needed. Patient voiced understanding.   Message routed to Dr. Ilda Basset for review.

## 2020-07-15 ENCOUNTER — Encounter: Payer: Self-pay | Admitting: Obstetrics and Gynecology

## 2020-07-15 DIAGNOSIS — A539 Syphilis, unspecified: Secondary | ICD-10-CM | POA: Insufficient documentation

## 2020-07-16 ENCOUNTER — Telehealth: Payer: Self-pay

## 2020-07-16 NOTE — Telephone Encounter (Addendum)
-----   Message from Aletha Halim, MD sent at 07/15/2020  8:25 PM EDT ----- Please call her and let her know that she is positive for syphilis and refer her to the [facility] for treatment and to let her partners know. Thank you   Called pt to notify her of positive RPR and need for treatment. VM left requesting call back.  Pt also needs to be scheduled for annual visit with PAP.

## 2020-07-16 NOTE — Telephone Encounter (Signed)
Pt returned phone call and states she has received treatment from [facility]. Reports she was prescribed 28 days of doxycycline. Explained to pt she should contact all recent partners and avoid intercourse until treatment is complete. Pt reports follow up appt with [facility] in 3 months. States she would like to come into our office for annual visit with PAP smear. [facility] office notified.

## 2020-07-19 ENCOUNTER — Other Ambulatory Visit (HOSPITAL_COMMUNITY)
Admission: RE | Admit: 2020-07-19 | Discharge: 2020-07-19 | Disposition: A | Discharge status: Home / Self Care | Payer: Medicaid Other | Source: Ambulatory Visit | Attending: Family Medicine | Admitting: Family Medicine

## 2020-07-19 ENCOUNTER — Ambulatory Visit (INDEPENDENT_AMBULATORY_CARE_PROVIDER_SITE_OTHER): Payer: Medicaid Other

## 2020-07-19 ENCOUNTER — Other Ambulatory Visit: Payer: Self-pay

## 2020-07-19 VITALS — BP 125/78 | HR 101 | Ht 67.0 in | Wt 254.2 lb

## 2020-07-19 DIAGNOSIS — B373 Candidiasis of vulva and vagina: Secondary | ICD-10-CM

## 2020-07-19 DIAGNOSIS — B3731 Acute candidiasis of vulva and vagina: Secondary | ICD-10-CM

## 2020-07-19 NOTE — Progress Notes (Signed)
Patient was assessed and managed by nursing staff during this encounter. I have reviewed the chart and agree with the documentation and plan. I have also made any necessary editorial changes.  Griffin Basil, MD 07/19/2020 11:44 AM

## 2020-07-19 NOTE — Progress Notes (Signed)
PT.signed Buyer, retail.

## 2020-07-19 NOTE — Progress Notes (Signed)
Pt here for Nurse visit for TOC of yeast infection. Reviewed Allergy's and Meds, no current symptoms. Advised results will be in My Chart in a couple of days, Pt explained that she does not know how to access My Chart, I advised that we can call her also. Pt verbalized understanding.

## 2020-07-22 ENCOUNTER — Ambulatory Visit: Payer: Medicaid Other | Admitting: Family Medicine

## 2020-07-22 LAB — CERVICOVAGINAL ANCILLARY ONLY
Candida Glabrata: NEGATIVE
Candida Vaginitis: POSITIVE — AB
Comment: NEGATIVE
Comment: NEGATIVE

## 2020-07-23 ENCOUNTER — Emergency Department (HOSPITAL_COMMUNITY)
Admission: EM | Admit: 2020-07-23 | Discharge: 2020-07-24 | Disposition: A | Discharge status: Home / Self Care | Payer: Medicaid Other | Attending: Emergency Medicine | Admitting: Emergency Medicine

## 2020-07-23 DIAGNOSIS — Z8616 Personal history of COVID-19: Secondary | ICD-10-CM | POA: Insufficient documentation

## 2020-07-23 DIAGNOSIS — Z794 Long term (current) use of insulin: Secondary | ICD-10-CM | POA: Insufficient documentation

## 2020-07-23 DIAGNOSIS — F17210 Nicotine dependence, cigarettes, uncomplicated: Secondary | ICD-10-CM | POA: Insufficient documentation

## 2020-07-23 DIAGNOSIS — Z20822 Contact with and (suspected) exposure to covid-19: Secondary | ICD-10-CM | POA: Diagnosis not present

## 2020-07-23 DIAGNOSIS — I10 Essential (primary) hypertension: Secondary | ICD-10-CM | POA: Insufficient documentation

## 2020-07-23 DIAGNOSIS — F209 Schizophrenia, unspecified: Secondary | ICD-10-CM | POA: Diagnosis present

## 2020-07-23 DIAGNOSIS — Z7984 Long term (current) use of oral hypoglycemic drugs: Secondary | ICD-10-CM | POA: Insufficient documentation

## 2020-07-23 DIAGNOSIS — R45851 Suicidal ideations: Secondary | ICD-10-CM | POA: Diagnosis not present

## 2020-07-23 DIAGNOSIS — E1142 Type 2 diabetes mellitus with diabetic polyneuropathy: Secondary | ICD-10-CM | POA: Diagnosis not present

## 2020-07-23 DIAGNOSIS — Z79899 Other long term (current) drug therapy: Secondary | ICD-10-CM | POA: Diagnosis not present

## 2020-07-23 DIAGNOSIS — F200 Paranoid schizophrenia: Secondary | ICD-10-CM | POA: Diagnosis not present

## 2020-07-23 LAB — CBC WITH DIFFERENTIAL/PLATELET
Abs Immature Granulocytes: 0.03 10*3/uL (ref 0.00–0.07)
Basophils Absolute: 0.1 10*3/uL (ref 0.0–0.1)
Basophils Relative: 1 %
Eosinophils Absolute: 0.4 10*3/uL (ref 0.0–0.5)
Eosinophils Relative: 4 %
HCT: 36.7 % (ref 36.0–46.0)
Hemoglobin: 11.5 g/dL — ABNORMAL LOW (ref 12.0–15.0)
Immature Granulocytes: 0 %
Lymphocytes Relative: 29 %
Lymphs Abs: 3.2 10*3/uL (ref 0.7–4.0)
MCH: 24.4 pg — ABNORMAL LOW (ref 26.0–34.0)
MCHC: 31.3 g/dL (ref 30.0–36.0)
MCV: 77.8 fL — ABNORMAL LOW (ref 80.0–100.0)
Monocytes Absolute: 0.6 10*3/uL (ref 0.1–1.0)
Monocytes Relative: 5 %
Neutro Abs: 6.7 10*3/uL (ref 1.7–7.7)
Neutrophils Relative %: 61 %
Platelets: 282 10*3/uL (ref 150–400)
RBC: 4.72 MIL/uL (ref 3.87–5.11)
RDW: 14.9 % (ref 11.5–15.5)
WBC: 11 10*3/uL — ABNORMAL HIGH (ref 4.0–10.5)
nRBC: 0 % (ref 0.0–0.2)

## 2020-07-23 LAB — RESP PANEL BY RT-PCR (FLU A&B, COVID) ARPGX2
Influenza A by PCR: NEGATIVE
Influenza B by PCR: NEGATIVE
SARS Coronavirus 2 by RT PCR: NEGATIVE

## 2020-07-23 LAB — COMPREHENSIVE METABOLIC PANEL
ALT: 16 U/L (ref 0–44)
AST: 19 U/L (ref 15–41)
Albumin: 3.9 g/dL (ref 3.5–5.0)
Alkaline Phosphatase: 50 U/L (ref 38–126)
Anion gap: 8 (ref 5–15)
BUN: 9 mg/dL (ref 6–20)
CO2: 24 mmol/L (ref 22–32)
Calcium: 9.2 mg/dL (ref 8.9–10.3)
Chloride: 107 mmol/L (ref 98–111)
Creatinine, Ser: 0.81 mg/dL (ref 0.44–1.00)
GFR, Estimated: >60 mL/min (ref >60)
Glucose, Bld: 88 mg/dL (ref 70–99)
Potassium: 3.9 mmol/L (ref 3.5–5.1)
Sodium: 139 mmol/L (ref 135–145)
Total Bilirubin: 0.2 mg/dL — ABNORMAL LOW (ref 0.3–1.2)
Total Protein: 7.6 g/dL (ref 6.5–8.1)

## 2020-07-23 LAB — CBG MONITORING, ED: Glucose-Capillary: 100 mg/dL — ABNORMAL HIGH (ref 70–99)

## 2020-07-23 LAB — RAPID URINE DRUG SCREEN, HOSP PERFORMED
Amphetamines: NOT DETECTED
Barbiturates: POSITIVE — AB
Benzodiazepines: NOT DETECTED
Cocaine: NOT DETECTED
Opiates: NOT DETECTED
Tetrahydrocannabinol: NOT DETECTED

## 2020-07-23 LAB — I-STAT BETA HCG BLOOD, ED (MC, WL, AP ONLY): I-stat hCG, quantitative: <5 m[IU]/mL (ref <5)

## 2020-07-23 LAB — LITHIUM LEVEL: Lithium Lvl: 0.42 mmol/L — ABNORMAL LOW (ref 0.60–1.20)

## 2020-07-23 LAB — ETHANOL: Alcohol, Ethyl (B): <10 mg/dL (ref <10)

## 2020-07-23 MED ORDER — LORAZEPAM 1 MG PO TABS
1.0000 mg | ORAL_TABLET | Freq: Once | ORAL | Status: AC | PRN
  Administered 2020-07-23: 1 mg via ORAL
  Filled 2020-07-23: qty 1

## 2020-07-23 MED ORDER — DOXYCYCLINE HYCLATE 100 MG PO TABS
100.0000 mg | ORAL_TABLET | Freq: Two times a day (BID) | ORAL | Status: DC
  Administered 2020-07-23 – 2020-07-24 (×2): 100 mg via ORAL
  Filled 2020-07-23 (×4): qty 1

## 2020-07-23 NOTE — BH Assessment (Signed)
Comprehensive Clinical Assessment (CCA) Screening, Triage and Referral Note  07/23/2020 [name] [id number]  Options For Referral:  Stacy John, PA, reviewed pt's chart and information and determined pt should be observed overnight for safety and stability and re-assessed in the morning by psychiatry. This information was relayed to pt's nurse, Stacy Garnet RN, at [phone].  The patient demonstrates the following risk factors for suicide: Chronic risk factors for suicide include: psychiatric disorder of Schizophrenia and previous suicide attempts the most recent taking place 05/26/2020.Marland Kitchen Acute risk factors for suicide include: social withdrawal/isolation. Protective factors for this patient include: positive therapeutic relationship. Considering these factors, the overall suicide risk at this point appears to be high. Patient is not appropriate for outpatient follow up.  Flowsheet Row ED from 07/23/2020 in [facility]PT ED from 07/03/2020 in [facility]PT ED from 06/27/2020 in [facility] DEPT  C-SSRS RISK CATEGORY High Risk No Risk Error: Question 6 not populated      Chief Complaint:  Chief Complaint  Patient presents with  . Suicidal    Anxiety   Visit Diagnosis: F20.9, Schizophrenia  [name] is a 30 year old patient who came to the [facility] due to increased depression and anxiety and due to experiencing SI with a plan to o/d. Pt shares she's also been experiencing increased nightmares that are quite realistic in nature.  Pt shares she attempted to kill herself by o/d approximately 1 month ago; pt's last admission into the hospital was 05/26/2020 from an attempted overdose. Pt denies HI, NSSIB, access to guns/weapons, engagement with the legal system, or SA. She shares she believes she may be experiencing VH, though she's not sure; she states she's been experiencing someone following her on the bus and  has had increased paranoia.  Pt lives in her own apartment. She denies having family members that are supportive of her. Pt receives ACT Team services through [facility]. She shares she is compliant with her medication.  Pt is oriented x5. Her recent/remote memory is intact. Pt was cooperative throughout the assessment process. Pt's insight, judgement, and impulse control is fair at this time.   Patient Reported Information How did you hear about [place]? Self   Referral name: Self   Referral phone number: 0 (N/A)  Whom do you see for routine medical problems? Primary Care   Practice/Facility Name: [facility] ACT Team - Dr. Jake [name]   Practice/Facility Phone Number: [phone] (Unknown)   Name of Contact: [name]   Contact Number: Unknown   Contact Fax Number: Unknown   Prescriber Name: Dr. Jake [name]   Prescriber Address (if known): Unknown  What Is the Reason for Your Visit/Call Today? Pt states, "I have a lot of anxiety and depression going on." Pt shares this started 2 days ago. She currently has a plan to o/d.  How Long Has This Been Causing You Problems? <Week  Have You Recently Been in Any Inpatient Treatment (Hospital/Detox/Crisis Center/28-Day Program)? Yes   Name/Location of Program/Hospital:[facility]   How Long Were You There? 5 days   When Were You Discharged? 05/04/2020  Have You Ever Received Services From [facility] Before? Yes   Who [facility] You [facility] at [facility]? [name] inpatient treatment  Have You Recently Had Any Thoughts About Hurting Yourself? Yes   Are You Planning to Commit Suicide/Harm Yourself At This time?  Yes  Have you Recently Had Thoughts About Hurting Someone [name]? No   Explanation: No data recorded Have You Used Any  Alcohol or Drugs in the Past 24 Hours? No   How Long Ago Did You Use Drugs or Alcohol?  No data recorded  What Did You Use and How Much? No data recorded What Do You Feel Would Help You the Most Today? Treatment for  Depression or other mood problem  Do You Currently Have a Therapist/Psychiatrist? Yes   Name of Therapist/Psychiatrist: [facility] - ACT Team   Have You Been Recently Discharged From Any Office Practice or Programs? No   Explanation of Discharge From Practice/Program:  No data recorded    CCA Screening Triage Referral Assessment Type of Contact: Tele-Assessment   Is this Initial or Reassessment? Initial Assessment   Date Telepsych consult ordered in [facility]:  07/23/2020   Time Telepsych consult ordered in [facility]:  [place]  Patient Reported Information Reviewed? Yes   Patient Left Without Being Seen? No data recorded  Reason for Not Completing Assessment: No data recorded Collateral Involvement: Pt declined having anyone clinician could make contact with for collateral information  Does Patient Have a [place]? No data recorded  Name and Contact of Legal Guardian:  Self.   If Minor and Not Living with Parent(s), Who has Custody? N/A  Is CPS involved or ever been involved? Never  Is APS involved or ever been involved? Never  Patient Determined To Be At Risk for Harm To Self or Others Based on Review of Patient Reported Information or Presenting Complaint? Yes, for Self-Harm   Method: No data recorded  Availability of Means: No data recorded  Intent: No data recorded  Notification Required: No data recorded  Additional Information for Danger to Others Potential:  No data recorded  Additional Comments for Danger to Others Potential:  No data recorded  Are There Guns or Other Weapons in Your Home?  No data recorded   Types of Guns/Weapons: No data recorded   Are These Weapons Safely Secured?                              No data recorded   Who Could Verify You Are Able To Have These Secured:    No data recorded Do You Have any Outstanding Charges, Pending Court Dates, Parole/Probation? No data recorded Contacted To Inform of Risk of Harm To Self or  Others: Other: Comment (Clinician will attempt to make contact with [facility] re: pt's hospital admission and overnight observation stay)  Location of Assessment: [facility]  Does Patient Present under Involuntary Commitment? No   IVC Papers Initial File Date: No data recorded  [place] of Residence: [place]  Patient Currently Receiving the Following Services: ACTT Architect)   Determination of Need: Urgent (48 hours)   Options For Referral: [facility] [name], [place], reviewed pt's chart and information and determined pt should be observed overnight for safety and stability and re-assessed in the morning by psychiatry. This information was relayed to pt's nurse, Stacy Garnet RN, at [phone].   Dannielle Burn, LMFT

## 2020-07-23 NOTE — ED Provider Notes (Signed)
[facility] DEPT Provider Note   CSN: [id number] Arrival date & time: 07/23/20  1546     History Chief Complaint  Patient presents with  . Suicidal    Anxiety    [name] is a 30 y.o. female.  Patient with history of schizophrenia, bipolar disorder, recent diagnosis of syphilis currently on 28-day course of doxycycline twice daily --presents the emergency department today for evaluation of worsening anxiety, depression, suicidal thoughts, and bad dreams.  Patient states that her symptoms have been worse since this morning.  She states that the dreams are about dead bodies and are bothersome.  She tells nurse that she is going to overdose on medications.  She denies any recent medical illnesses including fevers, URI symptoms, chest pain, SOB, vomiting, diarrhea.  The onset of this condition was acute. The course is constant. Aggravating factors: none. Alleviating factors: none.          Past Medical History:  Diagnosis Date  . Anxiety   . Bipolar 1 disorder ([place])   . Cognitive deficits   . Depression   . Diabetes mellitus without complication ([place])   . Hypertension   . Mental disorder   . Mental health disorder   . Obesity     Patient Active Problem List   Diagnosis Date Noted  . Syphilis 07/15/2020  . Malingering 06/05/2020  . Gastroesophageal reflux disease 05/04/2020  . Hyperglycemia due to type 2 diabetes mellitus ([place]) 05/04/2020  . Long term (current) use of insulin ([place]) 05/04/2020  . Migraine without aura 05/04/2020  . Morbid obesity ([place]) 05/04/2020  . 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 2 diabetes mellitus ([place]) 05/04/2020  . Prolapsed internal hemorrhoids 05/04/2020  . Vitamin D deficiency 05/04/2020  . Other symptoms and signs involving cognitive functions and awareness 05/04/2020  . Suicide attempt ([place])   . Anxiety state 03/06/2020  . Schizophrenia ([place]) 09/13/2019  . Bipolar I disorder, most recent episode depressed  ([place]) 06/23/2019  . MDD (major depressive disorder) 10/10/2018  . Schizoaffective disorder, bipolar type ([place]) 09/25/2018  . Affective psychosis, bipolar ([place]) 06/13/2018  . HTN (hypertension) 05/03/2018  . Tobacco use disorder 05/03/2018  . Adjustment disorder with emotional disturbance 01/02/2018  . Schizophrenia, disorganized ([place]) 11/30/2017  . Moderate bipolar I disorder, most recent episode depressed ([place])   . Psychosis ([place])   . 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 OCD (obsessive compulsive disorder) 10/05/2016  . Major depressive disorder, recurrent episode, mild ([place]) 05/04/2016  . 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 Learning disability 07/18/2015  . 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 Diabetes mellitus ([place]) 07/18/2015  . 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 Hyperlipidemia 07/18/2015  . Severe episode of recurrent major depressive disorder, without psychotic features ([place])   . Suicidal ideation   . Drug overdose   . Cognitive deficits 10/12/2012  . Generalized anxiety disorder 06/28/2012    Past Surgical History:  Procedure Laterality Date  . CESAREAN SECTION    . CESAREAN SECTION N/A 04/25/2013   Procedure: REPEAT CESAREAN SECTION;  Surgeon: [name], MD;  Location: [facility];  Service: Obstetrics;  Laterality: N/A;  . MASS EXCISION N/A 06/03/2012   Procedure: EXCISION MASS;  Surgeon: [name], MD;  Location: [facility];  Service: ENT;  Laterality: N/A;  Excision uvula mass  . TONSILLECTOMY N/A 06/03/2012   Procedure: TONSILLECTOMY;  Surgeon: [name], MD;  Location: [facility];  Service: ENT;  Laterality: N/A;  . TONSILLECTOMY       OB History    Gravida  3   Para  3   Term  3   Preterm  0   AB  0   Living  3     SAB  0   IAB  0   Ectopic  0   Multiple      Live  Births  3           Family History  Problem Relation Age of Onset  . Hypertension Mother   . Diabetes Father     Social History   Tobacco Use  . Smoking status: Current Every Day Smoker    Packs/day: 4.00    Years: 11.00    Pack years: 44.00    Types: Cigarettes, Cigars  . Smokeless tobacco: Never Used  . Tobacco comment: Pt declined  Vaping Use  . Vaping Use: Never used  Substance Use Topics  . Alcohol use: Not Currently    Comment: occ: last intake 4 mts ago  . Drug use: Not Currently    Types: "Crack" cocaine, Other-see comments    Comment: Patient reports hx of smoking Crack    Home Medications Prior to Admission medications   Medication Sig Start Date End Date Taking? Authorizing Provider  amLODipine (NORVASC) 5 MG tablet Take 5 mg by mouth daily.    [provider]  benztropine (COGENTIN) 1 MG tablet Take 1 mg by mouth 2 (two) times daily. 05/01/20   [provider]  cholecalciferol (VITAMIN D) 25 MCG (1000 UNIT) tablet Take 1,000 Units by mouth daily. 04/30/20   [provider]  DULoxetine (CYMBALTA) 60 MG capsule Take 1 capsule (60 mg total) by mouth daily. Patient not taking: Reported on 07/05/2020 05/31/20   Money, Lowry Ram, FNP  glipiZIDE (GLUCOTROL XL) 10 MG 24 hr tablet Take 10 mg by mouth once. Patient not taking: Reported on 07/05/2020    [provider]  haloperidol (HALDOL) 10 MG tablet Take 10 mg by mouth at bedtime. 05/09/20   [provider]  haloperidol decanoate (HALDOL DECANOATE) 50 MG/ML injection Inject 50 mg into the muscle every 28 (twenty-eight) days. 03/19/20   [provider]  hydrOXYzine (ATARAX/VISTARIL) 25 MG tablet Take 1 tablet (25 mg total) by mouth 3 (three) times daily as needed for anxiety. 05/27/20   Derrill Center, NP  insulin detemir (LEVEMIR) 100 UNIT/ML injection Inject 0.4 mLs (40 Units total) into the skin 2 (two) times daily. 05/05/20   Montine Circle, PA-C  lithium carbonate  (LITHOBID) 300 MG CR tablet Take 2 tablets (600 mg total) by mouth every 12 (twelve) hours. Patient taking differently: Take 300 mg by mouth 3 (three) times daily. 05/05/20 06/04/20  Montine Circle, PA-C  metroNIDAZOLE (FLAGYL) 500 MG tablet Take 1 tablet (500 mg total) by mouth 2 (two) times daily. 07/05/20   Aletha Halim, MD  omeprazole (PRILOSEC) 20 MG capsule Take 20 mg by mouth daily. Patient not taking: Reported on 07/05/2020 04/30/20   [provider]  ondansetron (ZOFRAN ODT) 4 MG disintegrating tablet Take 1 tablet (4 mg total) by mouth every 8 (eight) hours as needed for nausea or vomiting. Patient not taking: Reported on 07/05/2020 05/05/20   Montine Circle, PA-C  propranolol (INDERAL) 10 MG tablet Take 1 tablet (10 mg total) by mouth 2 (two) times daily. 05/05/20 06/04/20  Montine Circle, PA-C  simvastatin ([place])  20 MG tablet Take 20 mg by mouth at bedtime. 08/21/19   [provider]  vitamin B-12 (CYANOCOBALAMIN) 1000 MCG tablet Take 1,000 mcg by mouth daily. 11/13/19   [provider]    Allergies    Wellbutrin [bupropion], Omnipaque [iohexol], Penicillins, Cogentin [benztropine], and Depakote er [divalproex sodium er]  Review of Systems   Review of Systems  Constitutional: Negative for fever.  HENT: Negative for rhinorrhea and sore throat.   Eyes: Negative for redness.  Respiratory: Negative for cough.   Cardiovascular: Negative for chest pain.  Gastrointestinal: Negative for abdominal pain, diarrhea, nausea and vomiting.  Genitourinary: Negative for dysuria, frequency, hematuria and urgency.  Musculoskeletal: Negative for myalgias.  Skin: Negative for rash.  Neurological: Negative for headaches.  Psychiatric/Behavioral: Positive for sleep disturbance and suicidal ideas. The patient is nervous/anxious.     Physical Exam Updated Vital Signs BP 138/82 (BP Location: Right Arm)   Pulse 87   Temp 98.5 F (36.9 C) (Oral)   Resp (!) 21   LMP  06/25/2020   SpO2 99%   Physical Exam Vitals and nursing note reviewed.  Constitutional:      General: She is not in acute distress.    Appearance: She is well-developed.  HENT:     Head: Normocephalic and atraumatic.     Right Ear: External ear normal.     Left Ear: External ear normal.     Nose: Nose normal.  Eyes:     Conjunctiva/sclera: Conjunctivae normal.  Cardiovascular:     Rate and Rhythm: Normal rate and regular rhythm.     Heart sounds: No murmur heard.   Pulmonary:     Effort: No respiratory distress.     Breath sounds: No wheezing, rhonchi or rales.  Abdominal:     Palpations: Abdomen is soft.     Tenderness: There is no abdominal tenderness. There is no guarding or rebound.  Musculoskeletal:     Cervical back: Normal range of motion and neck supple.     Right lower leg: No edema.     Left lower leg: No edema.  Skin:    General: Skin is warm and dry.     Findings: No rash.  Neurological:     General: No focal deficit present.     Mental Status: She is alert. Mental status is at baseline.     Motor: No weakness.  Psychiatric:        Attention and Perception: Attention normal.        Mood and Affect: Affect is angry.        Speech: Speech normal.        Behavior: Behavior is aggressive.        Thought Content: Thought content includes suicidal ideation.     Comments: Patient is agitated, yelling in hallway that whoever gave her syphilis is going to die.      ED Results / Procedures / Treatments   Labs (all labs ordered are listed, but only abnormal results are displayed) Labs Reviewed  CBG MONITORING, ED - Abnormal; Notable for the following components:      Result Value   Glucose-Capillary 100 (*)    All other components within normal limits  RESP PANEL BY RT-PCR (FLU A&B, COVID) ARPGX2  COMPREHENSIVE METABOLIC PANEL  ETHANOL  RAPID URINE DRUG SCREEN, HOSP PERFORMED  CBC WITH DIFFERENTIAL/PLATELET  LITHIUM LEVEL  I-STAT BETA HCG BLOOD, ED ([facility],  [facility], [facility] ONLY)    EKG None  Radiology No  results found.  Procedures Procedures   Medications Ordered in ED Medications  LORazepam (ATIVAN) tablet 1 mg (has no administration in time range)  doxycycline (VIBRA-TABS) tablet 100 mg (has no administration in time range)    ED Course  I have reviewed the triage vital signs and the nursing notes.  Pertinent labs & imaging results that were available during my care of the patient were reviewed by me and considered in my medical decision making (see chart for details).  Patient seen and examined. Work-up initiated. Medical screening labs ordered. Will continue her syphilis treatment (doxycycline 2/2 penicillin allergy).   Vital signs reviewed and are as follows: BP 138/82 (BP Location: Right Arm)   Pulse 87   Temp 98.5 F (36.9 C) (Oral)   Resp (!) 21   LMP 06/25/2020   SpO2 99%   6:27 PM Labs reviewed. Pt is medically cleared. Awaiting TTS reccs.   Pending TTS eval.     MDM Rules/Calculators/A&P                          Pt medically cleared. Currently voluntary.    Final Clinical Impression(s) / ED Diagnoses Final diagnoses:  Suicidal thoughts    Rx / DC Orders ED Discharge Orders    None       Suann Larry 07/23/20 2150    Quintella Reichert, MD 07/24/20 0020

## 2020-07-23 NOTE — ED Triage Notes (Signed)
Pt to [facility] from home. Pt states having SI thoughts, going to overdose. Pt is anxious. Pt taking about dead bodies.  Pt dressed out.

## 2020-07-24 DIAGNOSIS — F200 Paranoid schizophrenia: Secondary | ICD-10-CM

## 2020-07-24 LAB — CBG MONITORING, ED: Glucose-Capillary: 143 mg/dL — ABNORMAL HIGH (ref 70–99)

## 2020-07-24 NOTE — Consult Note (Signed)
Telepsych Consultation   Reason for Consult: Tele-psych evaluation Referring Physician: DGLOV Location of Patient: [facility] Location of Provider: [place] Department  Patient Identification: [name]  MRN:  [id number]  Principal Diagnosis: Schizophrenia ([place])  Diagnosis:  Principal Problem:   Schizophrenia ([place])  Total Time spent with patient: 30 minutes  Subjective:   [name] is a 30 y.o. female patient admitted with history of schizophrenia, bipolar disorder.  HPI: (Per ED provider's notes): Patient with history of schizophrenia, bipolar disorder, recent diagnosis of syphilis currently on 28-day course of doxycycline twice daily --presents the emergency department today for evaluation of worsening anxiety, depression, suicidal thoughts, and bad dreams.  Patient states that her symptoms have been worse since this morning.  She states that the dreams are about dead bodies and are bothersome.  She tells nurse that she is going to overdose on medications.  She denies any recent medical illnesses including fevers, URI symptoms, chest pain, SOB, vomiting, diarrhea.  The onset of this condition was acute. The course is constant. Aggravating factors: none. Alleviating factors: none.  During this follow-up tele-psych evaluation: [name] reports: I'm frustrated with my act team. I have been with them for 3 months. They were suppose to help me fine a good housing. They have not done that & I don't know when that will happen. I live in a bad neighborhood. I always think that somebody is following me. And my mama would not let me come to her house. I saw my mama last 4 months ago. I'm upset about that too. Sometime I go to bed upset, then I been having bad dream at night. I dream about dead bodies. I dream about me going to jail & I dream about fire all around me. That was why I was upset yesterday. But, I'm feeling better now. I take my medicines like I'm suppose to. I take  Lithium, Haldol pills & Haldol shots. I got the haldol shot a week ago. I also take Bupar & Hydroxyzine. Can I go home now? I don't feel upset no more. Can you help me with transportation to go back to my apartment?"  Past Psychiatric History: Schizophrenia.  Risk to Self: Denies Risk to Others: Denies Prior Inpatient Therapy: Yes. Prior Outpatient Therapy: Yes.  Past Medical History:  Past Medical History:  Diagnosis Date  . Anxiety   . Bipolar 1 disorder ([place])   . Cognitive deficits   . Depression   . Diabetes mellitus without complication ([place])   . Hypertension   . Mental disorder   . Mental health disorder   . Obesity     Past Surgical History:  Procedure Laterality Date  . CESAREAN SECTION    . CESAREAN SECTION N/A 04/25/2013   Procedure: REPEAT CESAREAN SECTION;  Surgeon: [name], MD;  Location: [facility];  Service: Obstetrics;  Laterality: N/A;  . MASS EXCISION N/A 06/03/2012   Procedure: EXCISION MASS;  Surgeon: [name], MD;  Location: [facility];  Service: ENT;  Laterality: N/A;  Excision uvula mass  . TONSILLECTOMY N/A 06/03/2012   Procedure: TONSILLECTOMY;  Surgeon: [name], MD;  Location: [facility];  Service: ENT;  Laterality: N/A;  . TONSILLECTOMY     Family History:  Family History  Problem Relation Age of Onset  . Hypertension Mother   . Diabetes Father    Family Psychiatric  History: None reported.  Social History:  Social History   Substance and Sexual Activity  Alcohol Use Not Currently   Comment: occ: last intake 4 mts ago     Social History   Substance and Sexual Activity  Drug Use Not Currently  . Types: "Crack" cocaine, Other-see comments   Comment: Patient reports hx of smoking Crack    Social History   Socioeconomic History  . Marital status: Single    Spouse name: Not on file  . Number of children: Not on file  . Years of education: Not on file  . Highest education level: Not on file   Occupational History  . Not on file  Tobacco Use  . Smoking status: Current Every Day Smoker    Packs/day: 4.00    Years: 11.00    Pack years: 44.00    Types: Cigarettes, Cigars  . Smokeless tobacco: Never Used  . Tobacco comment: Pt declined  Vaping Use  . Vaping Use: Never used  Substance and Sexual Activity  . Alcohol use: Not Currently    Comment: occ: last intake 4 mts ago  . Drug use: Not Currently    Types: "Crack" cocaine, Other-see comments    Comment: Patient reports hx of smoking Crack  . Sexual activity: Yes    Birth control/protection: Implant  Other Topics Concern  . Not on file  Social History Narrative   ** Merged History Encounter **       Social Determinants of Health   Financial Resource Strain: Not on file  Food Insecurity: Not on file  Transportation Needs: Not on file  Physical Activity: Not on file  Stress: Not on file  Social Connections: Not on file   Additional Social History: Allergies:   Allergies  Allergen Reactions  . Wellbutrin [Bupropion] Shortness Of Breath  . Omnipaque [Iohexol] Swelling and Other (See Comments)    Reaction:  Eye swelling  . Penicillins Hives and Other (See Comments)    Has patient had a PCN reaction causing immediate rash, facial/tongue/throat swelling, SOB or lightheadedness with hypotension: Unknown Has patient had a PCN reaction causing severe rash involving mucus membranes or skin necrosis: Yes Has patient had a PCN reaction that required hospitalization Unknown Has patient had a PCN reaction occurring within the last 10 years: No If all of the above answers are "NO", then may proceed with Cephalosporin use.  Herma Mering [Benztropine]     Make pt feel crazy  . Depakote Er [Divalproex Sodium Er] Nausea And Vomiting   Labs:  Results for orders placed or performed during the hospital encounter of 07/23/20 (from the past 48 hour(s))  CBG monitoring, ED     Status: Abnormal   Collection Time: 07/23/20  4:17 PM   Result Value Ref Range   Glucose-Capillary 100 (H) 70 - 99 mg/dL    Comment: Glucose reference range applies only to samples taken after fasting for at least 8 hours.  Ethanol     Status: None   Collection Time: 07/23/20  4:33 PM  Result Value Ref Range   Alcohol, Ethyl (B) <10 <10 mg/dL    Comment: (NOTE) Lowest detectable limit for serum alcohol is 10 mg/dL.  For medical purposes only. Performed at [facility], [place] [address] [place], [place] [address]   Lithium level     Status: Abnormal   Collection Time: 07/23/20  4:37 PM  Result Value Ref Range   Lithium Lvl 0.42 (L) 0.60 - 1.20 mmol/L    Comment: Performed at [facility], [place] [address] [place], [place] [address]  Comprehensive metabolic panel     Status: Abnormal   Collection Time: 07/23/20  4:55 PM  Result Value Ref Range   Sodium 139 135 - 145 mmol/L   Potassium 3.9 3.5 - 5.1 mmol/L   Chloride 107 98 - 111 mmol/L   CO2 24 22 - 32 mmol/L   Glucose, Bld 88 70 - 99 mg/dL    Comment: Glucose reference range applies only to samples taken after fasting for at least 8 hours.   BUN 9 6 - 20 mg/dL   Creatinine, Ser 0.81 0.44 - 1.00 mg/dL   Calcium 9.2 8.9 - 10.3 mg/dL   Total Protein 7.6 6.5 - 8.1 g/dL   Albumin 3.9 3.5 - 5.0 g/dL   AST 19 15 - 41 U/L   ALT 16 0 - 44 U/L   Alkaline Phosphatase 50 38 - 126 U/L   Total Bilirubin 0.2 (L) 0.3 - 1.2 mg/dL   GFR, Estimated >60 >60 mL/min    Comment: (NOTE) Calculated using the CKD-EPI Creatinine Equation (2021)    Anion gap 8 5 - 15    Comment: Performed at [facility], [place] [address], [place], [place] [address]  CBC with Diff     Status: Abnormal   Collection Time: 07/23/20  4:55 PM  Result Value Ref Range   WBC 11.0 (H) 4.0 - 10.5 K/uL   RBC 4.72 3.87 - 5.11 MIL/uL   Hemoglobin 11.5 (L) 12.0 - 15.0 g/dL   HCT 36.7 36.0 - 46.0 %   MCV 77.8 (L) 80.0 - 100.0 fL   MCH 24.4 (L) 26.0 - 34.0 pg   MCHC 31.3 30.0  - 36.0 g/dL   RDW 14.9 11.5 - 15.5 %   Platelets 282 150 - 400 K/uL   nRBC 0.0 0.0 - 0.2 %   Neutrophils Relative % 61 %   Neutro Abs 6.7 1.7 - 7.7 K/uL   Lymphocytes Relative 29 %   Lymphs Abs 3.2 0.7 - 4.0 K/uL   Monocytes Relative 5 %   Monocytes Absolute 0.6 0.1 - 1.0 K/uL   Eosinophils Relative 4 %   Eosinophils Absolute 0.4 0.0 - 0.5 K/uL   Basophils Relative 1 %   Basophils Absolute 0.1 0.0 - 0.1 K/uL   Immature Granulocytes 0 %   Abs Immature Granulocytes 0.03 0.00 - 0.07 K/uL    Comment: Performed at [facility], [place] [address] [place], [place] [address]  I-Stat beta hCG blood, ED     Status: None   Collection Time: 07/23/20  5:00 PM  Result Value Ref Range   I-stat hCG, quantitative <5.0 <5 mIU/mL   Comment 3            Comment:   GEST. AGE      CONC.  (mIU/mL)   <=1 WEEK        5 - 50     2 WEEKS       50 - 500     3 WEEKS       100 - 10,000     4 WEEKS     1,000 - 30,000        FEMALE AND NON-PREGNANT FEMALE:     LESS THAN 5 mIU/mL   Resp Panel by RT-PCR (Flu A&B, Covid) Nasopharyngeal Swab     Status: None   Collection Time: 07/23/20  5:16 PM   Specimen: Nasopharyngeal Swab; Nasopharyngeal(NP) swabs in vial transport medium  Result Value Ref Range  SARS Coronavirus 2 by RT PCR NEGATIVE NEGATIVE    Comment: (NOTE) SARS-CoV-2 target nucleic acids are NOT DETECTED.  The SARS-CoV-2 RNA is generally detectable in upper respiratory specimens during the acute phase of infection. The lowest concentration of SARS-CoV-2 viral copies this assay can detect is 138 copies/mL. A negative result does not preclude SARS-Cov-2 infection and should not be used as the sole basis for treatment or other patient management decisions. A negative result may occur with  improper specimen collection/handling, submission of specimen other than nasopharyngeal swab, presence of viral mutation(s) within the areas targeted by this assay, and inadequate number of  viral copies(<138 copies/mL). A negative result must be combined with clinical observations, patient history, and epidemiological information. The expected result is Negative.  Fact Sheet for Patients:  EntrepreneurPulse.com.au  Fact Sheet for Healthcare Providers:  IncredibleEmployment.be  This test is no t yet approved or cleared by the [place] FDA and  has been authorized for detection and/or diagnosis of SARS-CoV-2 by FDA under an Emergency Use Authorization (EUA). This EUA will remain  in effect (meaning this test can be used) for the duration of the COVID-19 declaration under Section 564(b)(1) of the Act, 21 U.S.C.section 360bbb-3(b)(1), unless the authorization is terminated  or revoked sooner.       Influenza A by PCR NEGATIVE NEGATIVE   Influenza B by PCR NEGATIVE NEGATIVE    Comment: (NOTE) The Xpert Xpress SARS-CoV-2/FLU/RSV plus assay is intended as an aid in the diagnosis of influenza from Nasopharyngeal swab specimens and should not be used as a sole basis for treatment. Nasal washings and aspirates are unacceptable for Xpert Xpress SARS-CoV-2/FLU/RSV testing.  Fact Sheet for Patients: EntrepreneurPulse.com.au  Fact Sheet for Healthcare Providers: IncredibleEmployment.be  This test is not yet approved or cleared by the [place] FDA and has been authorized for detection and/or diagnosis of SARS-CoV-2 by FDA under an Emergency Use Authorization (EUA). This EUA will remain in effect (meaning this test can be used) for the duration of the COVID-19 declaration under Section 564(b)(1) of the Act, 21 U.S.C. section 360bbb-3(b)(1), unless the authorization is terminated or revoked.  Performed at [facility], [place] [address], [place], [place] [address]   Urine rapid drug screen (hosp performed)     Status: Abnormal   Collection Time: 07/23/20  5:18 PM  Result Value  Ref Range   Opiates NONE DETECTED NONE DETECTED   Cocaine NONE DETECTED NONE DETECTED   Benzodiazepines NONE DETECTED NONE DETECTED   Amphetamines NONE DETECTED NONE DETECTED   Tetrahydrocannabinol NONE DETECTED NONE DETECTED   Barbiturates POSITIVE (A) NONE DETECTED    Comment: (NOTE) DRUG SCREEN FOR MEDICAL PURPOSES ONLY.  IF CONFIRMATION IS NEEDED FOR ANY PURPOSE, NOTIFY LAB WITHIN 5 DAYS.  LOWEST DETECTABLE LIMITS FOR URINE DRUG SCREEN Drug Class                     Cutoff (ng/mL) Amphetamine and metabolites    1000 Barbiturate and metabolites    200 Benzodiazepine                 102 Tricyclics and metabolites     300 Opiates and metabolites        300 Cocaine and metabolites        300 THC                            50 Performed at [facility]  [facility], [place] [address] [place], [place] [address]   CBG monitoring, ED     Status: Abnormal   Collection Time: 07/24/20  9:24 AM  Result Value Ref Range   Glucose-Capillary 143 (H) 70 - 99 mg/dL    Comment: Glucose reference range applies only to samples taken after fasting for at least 8 hours.   Medications:  Current Facility-Administered Medications  Medication Dose Route Frequency Provider Last Rate Last Admin  . doxycycline (VIBRA-TABS) tablet 100 mg  100 mg Oral Q12H Carlisle Cater, PA-C   100 mg at 07/24/20 1540   Current Outpatient Medications  Medication Sig Dispense Refill  . amLODipine (NORVASC) 5 MG tablet Take 5 mg by mouth daily. (Patient not taking: Reported on 07/23/2020)    . benztropine (COGENTIN) 1 MG tablet Take 1 mg by mouth 2 (two) times daily. (Patient not taking: Reported on 07/23/2020)    . cholecalciferol (VITAMIN D) 25 MCG (1000 UNIT) tablet Take 1,000 Units by mouth daily.    . DULoxetine (CYMBALTA) 60 MG capsule Take 1 capsule (60 mg total) by mouth daily. (Patient not taking: No sig reported)  3  . glipiZIDE (GLUCOTROL XL) 10 MG 24 hr tablet Take 10 mg by mouth once. (Patient not taking:  No sig reported)    . haloperidol (HALDOL) 10 MG tablet Take 10 mg by mouth at bedtime. (Patient not taking: Reported on 07/23/2020)    . haloperidol decanoate (HALDOL DECANOATE) 50 MG/ML injection Inject 50 mg into the muscle every 28 (twenty-eight) days. (Patient not taking: Reported on 07/23/2020)    . hydrOXYzine (ATARAX/VISTARIL) 25 MG tablet Take 1 tablet (25 mg total) by mouth 3 (three) times daily as needed for anxiety. (Patient not taking: Reported on 07/23/2020) 30 tablet 0  . insulin detemir (LEVEMIR) 100 UNIT/ML injection Inject 0.4 mLs (40 Units total) into the skin 2 (two) times daily. (Patient not taking: Reported on 07/23/2020) 10 mL 11  . omeprazole (PRILOSEC) 20 MG capsule Take 20 mg by mouth daily. (Patient not taking: No sig reported)    . propranolol (INDERAL) 10 MG tablet Take 1 tablet (10 mg total) by mouth 2 (two) times daily. (Patient not taking: Reported on 07/23/2020) 60 tablet 0  . simvastatin (ZOCOR) 20 MG tablet Take 20 mg by mouth at bedtime. (Patient not taking: Reported on 07/23/2020)    . vitamin B-12 (CYANOCOBALAMIN) 1000 MCG tablet Take 1,000 mcg by mouth daily. (Patient not taking: Reported on 07/23/2020)     Musculoskeletal: Strength & Muscle Tone: within normal limits Gait & Station: normal Patient leans: N/A  Psychiatric Specialty Exam: Physical Exam Vitals reviewed.  HENT:     Nose: Nose normal.     Mouth/Throat:     Pharynx: Oropharynx is clear.  Eyes:     Pupils: Pupils are equal, round, and reactive to light.  Genitourinary:    Comments: Deferred Musculoskeletal:        General: Normal range of motion.  Skin:    General: Skin is warm and dry.  Neurological:     General: No focal deficit present.     Mental Status: She is oriented to person, place, and time.     Review of Systems  Constitutional: Negative.   HENT: Negative.   Eyes: Negative.   Respiratory: Negative.   Cardiovascular: Negative.   Endocrine: Negative.   Genitourinary:  Negative.   Musculoskeletal: Negative.   Neurological: Negative.   Hematological: Negative.   Psychiatric/Behavioral: Negative for agitation, behavioral problems, confusion,  decreased concentration, dysphoric mood, hallucinations, self-injury, sleep disturbance and suicidal ideas. The patient is not nervous/anxious and is not hyperactive.     Blood pressure 128/81, pulse 87, temperature 98.4 F (36.9 C), temperature source Oral, resp. rate 18, last menstrual period 06/25/2020, SpO2 100 %.There is no height or weight on file to calculate BMI.  General Appearance: Casual  Eye Contact:  Good  Speech:  Clear and Coherent and Normal Rate  Volume:  Normal  Mood:  Euthymic  Affect:  Appropriate and Congruent  Thought Process:  Coherent and Descriptions of Associations: Intact  Orientation:  Full (Time, Place, and Person)  Thought Content:  Logical  Suicidal Thoughts:  No  Homicidal Thoughts:  No  Memory:  Immediate;   Good Recent;   Good Remote;   Good  Judgement:  Fair  Insight:  Fair  Psychomotor Activity:  Normal  Concentration:  Concentration: Good and Attention Span: Good  Recall:  Good  Fund of Knowledge:  Fair  Language:  Good  Akathisia:  Negative  Handed:  Right  AIMS (if indicated):     Assets:  Communication Skills Desire for Improvement Resilience Social Support  ADL's:  Intact  Cognition:  WNL  Sleep:      Treatment Plan Summary: Daily contact with patient to assess and evaluate symptoms and progress in treatment and Medication management  Disposition: No evidence of imminent risk to self or others at present.   Patient does not meet criteria for psychiatric inpatient admission. Discussed crisis plan, support from social network, calling 911, coming to the Emergency Department, and calling Suicide Hotline. Discharge patient to her place of resident when medically cleared or stable.  This service was provided via telemedicine using a 2-way, interactive audio and  video technology.  Names of all persons participating in this telemedicine service and their role in this encounter. Name: Lindell Spar Role: PMHNP   Lindell Spar, NP, PMHNP, FNP-BC 07/24/2020 7:13 PM

## 2020-07-24 NOTE — ED Provider Notes (Signed)
Emergency Medicine Observation Re-evaluation Note  [name] is a 30 y.o. female, seen on rounds today.  Pt initially presented to the ED for complaints of Suicidal (Anxiety) Currently, the patient is overnight observation and reassessment.  Physical Exam  BP 121/70 (BP Location: Left Arm)   Pulse 73   Temp 98.6 F (37 C) (Oral)   Resp 18   LMP 06/25/2020   SpO2 97%  Physical Exam General: in bed, answers to name Lungs: no respiratory distress, unlabored breathing Psych: currently calm and resting  ED Course / MDM  EKG:   I have reviewed the labs performed to date as well as medications administered while in observation.   Plan  Current plan is for psych reassessment   Lorelle Gibbs, DO 07/24/20 8563

## 2020-07-24 NOTE — ED Notes (Signed)
TTS in with patient now

## 2020-07-24 NOTE — BH Assessment (Signed)
Sent patient's nurse a secure chat. Clinician requested TTS machine to be placed in patient's room for provider assessment. Agustina Caroli, NP, will complete patient's reassessment.

## 2020-07-24 NOTE — Progress Notes (Signed)
TOC CM spoke to patient and states she need transportation to her home. Pt signed [facility] waiver and wavier scanned to [email or web address]. Pt states she has monthly bus pass. She has [facility] and ACTT team. Jonnie Finner RN [place], [place] ED Nina CM [phone]

## 2020-07-24 NOTE — Discharge Instructions (Signed)
For your behavioral health needs, you are advised to continue treatment with the [facility] ACT Team: ° °     [facility] °     [address] °     [place], [place] [address] °     [phone]

## 2020-07-24 NOTE — BH Assessment (Signed)
[facility] Assessment Progress Note  Per Lindell Spar, NP at 15:45, this voluntary pt does not require psychiatric hospitalization at this time.  Pt is psychiatrically cleared.  Discharge instructions advise pt to continue treatment with the [facility] ACT Team.  Herbert Pun reports that pt will need a ride home.  At 15:48 I called [facility] to make this request.  They agree to call back, and, giving them my number, I informed them that my shift would end at 16:30.  Return call is pending as of this writing.  EDP Carmin Muskrat, MD and pt's nurse, Tilda Franco, have been notified.  Jalene Mullet, [facility] Triage Specialist [phone]

## 2020-07-24 NOTE — ED Provider Notes (Signed)
Patient cleared for discharge by our behavioral health team.   Carmin Muskrat, MD 07/24/20 1601

## 2020-07-29 ENCOUNTER — Other Ambulatory Visit: Payer: Self-pay

## 2020-07-29 ENCOUNTER — Emergency Department (HOSPITAL_COMMUNITY)
Admission: EM | Admit: 2020-07-29 | Discharge: 2020-07-30 | Disposition: A | Discharge status: Home / Self Care | Payer: Medicaid Other | Attending: Emergency Medicine | Admitting: Emergency Medicine

## 2020-07-29 ENCOUNTER — Telehealth: Payer: Self-pay | Admitting: Lactation Services

## 2020-07-29 ENCOUNTER — Encounter (HOSPITAL_COMMUNITY): Payer: Self-pay

## 2020-07-29 DIAGNOSIS — I10 Essential (primary) hypertension: Secondary | ICD-10-CM | POA: Diagnosis not present

## 2020-07-29 DIAGNOSIS — E1142 Type 2 diabetes mellitus with diabetic polyneuropathy: Secondary | ICD-10-CM | POA: Diagnosis not present

## 2020-07-29 DIAGNOSIS — Z79899 Other long term (current) drug therapy: Secondary | ICD-10-CM | POA: Insufficient documentation

## 2020-07-29 DIAGNOSIS — F4325 Adjustment disorder with mixed disturbance of emotions and conduct: Secondary | ICD-10-CM | POA: Insufficient documentation

## 2020-07-29 DIAGNOSIS — F29 Unspecified psychosis not due to a substance or known physiological condition: Secondary | ICD-10-CM | POA: Diagnosis not present

## 2020-07-29 DIAGNOSIS — F17210 Nicotine dependence, cigarettes, uncomplicated: Secondary | ICD-10-CM | POA: Insufficient documentation

## 2020-07-29 DIAGNOSIS — F1490 Cocaine use, unspecified, uncomplicated: Secondary | ICD-10-CM | POA: Insufficient documentation

## 2020-07-29 DIAGNOSIS — Z7984 Long term (current) use of oral hypoglycemic drugs: Secondary | ICD-10-CM | POA: Diagnosis not present

## 2020-07-29 DIAGNOSIS — Z794 Long term (current) use of insulin: Secondary | ICD-10-CM | POA: Diagnosis not present

## 2020-07-29 DIAGNOSIS — Z20822 Contact with and (suspected) exposure to covid-19: Secondary | ICD-10-CM | POA: Diagnosis not present

## 2020-07-29 DIAGNOSIS — F411 Generalized anxiety disorder: Secondary | ICD-10-CM | POA: Diagnosis not present

## 2020-07-29 DIAGNOSIS — Z046 Encounter for general psychiatric examination, requested by authority: Secondary | ICD-10-CM | POA: Diagnosis not present

## 2020-07-29 DIAGNOSIS — F250 Schizoaffective disorder, bipolar type: Secondary | ICD-10-CM | POA: Insufficient documentation

## 2020-07-29 DIAGNOSIS — R45851 Suicidal ideations: Secondary | ICD-10-CM | POA: Diagnosis not present

## 2020-07-29 DIAGNOSIS — F22 Delusional disorders: Secondary | ICD-10-CM | POA: Diagnosis not present

## 2020-07-29 LAB — CBC WITH DIFFERENTIAL/PLATELET
Abs Immature Granulocytes: 0.02 10*3/uL (ref 0.00–0.07)
Basophils Absolute: 0.1 10*3/uL (ref 0.0–0.1)
Basophils Relative: 1 %
Eosinophils Absolute: 0.3 10*3/uL (ref 0.0–0.5)
Eosinophils Relative: 4 %
HCT: 33.6 % — ABNORMAL LOW (ref 36.0–46.0)
Hemoglobin: 10.5 g/dL — ABNORMAL LOW (ref 12.0–15.0)
Immature Granulocytes: 0 %
Lymphocytes Relative: 44 %
Lymphs Abs: 3.8 10*3/uL (ref 0.7–4.0)
MCH: 24.4 pg — ABNORMAL LOW (ref 26.0–34.0)
MCHC: 31.3 g/dL (ref 30.0–36.0)
MCV: 78 fL — ABNORMAL LOW (ref 80.0–100.0)
Monocytes Absolute: 0.5 10*3/uL (ref 0.1–1.0)
Monocytes Relative: 6 %
Neutro Abs: 3.9 10*3/uL (ref 1.7–7.7)
Neutrophils Relative %: 45 %
Platelets: 260 10*3/uL (ref 150–400)
RBC: 4.31 MIL/uL (ref 3.87–5.11)
RDW: 15 % (ref 11.5–15.5)
WBC: 8.7 10*3/uL (ref 4.0–10.5)
nRBC: 0 % (ref 0.0–0.2)

## 2020-07-29 LAB — COMPREHENSIVE METABOLIC PANEL
ALT: 19 U/L (ref 0–44)
AST: 23 U/L (ref 15–41)
Albumin: 4 g/dL (ref 3.5–5.0)
Alkaline Phosphatase: 51 U/L (ref 38–126)
Anion gap: 6 (ref 5–15)
BUN: 10 mg/dL (ref 6–20)
CO2: 26 mmol/L (ref 22–32)
Calcium: 9.2 mg/dL (ref 8.9–10.3)
Chloride: 108 mmol/L (ref 98–111)
Creatinine, Ser: 0.79 mg/dL (ref 0.44–1.00)
GFR, Estimated: >60 mL/min (ref >60)
Glucose, Bld: 167 mg/dL — ABNORMAL HIGH (ref 70–99)
Potassium: 3.9 mmol/L (ref 3.5–5.1)
Sodium: 140 mmol/L (ref 135–145)
Total Bilirubin: 0.5 mg/dL (ref 0.3–1.2)
Total Protein: 7.2 g/dL (ref 6.5–8.1)

## 2020-07-29 LAB — RESP PANEL BY RT-PCR (FLU A&B, COVID) ARPGX2
Influenza A by PCR: NEGATIVE
Influenza B by PCR: NEGATIVE
SARS Coronavirus 2 by RT PCR: NEGATIVE

## 2020-07-29 LAB — RAPID URINE DRUG SCREEN, HOSP PERFORMED
Amphetamines: NOT DETECTED
Barbiturates: POSITIVE — AB
Benzodiazepines: NOT DETECTED
Cocaine: NOT DETECTED
Opiates: NOT DETECTED
Tetrahydrocannabinol: NOT DETECTED

## 2020-07-29 LAB — ETHANOL: Alcohol, Ethyl (B): <10 mg/dL (ref <10)

## 2020-07-29 LAB — I-STAT BETA HCG BLOOD, ED (MC, WL, AP ONLY): I-stat hCG, quantitative: <5 m[IU]/mL (ref <5)

## 2020-07-29 MED ORDER — BENZTROPINE MESYLATE 1 MG PO TABS
1.0000 mg | ORAL_TABLET | Freq: Two times a day (BID) | ORAL | Status: DC
  Administered 2020-07-29: 1 mg via ORAL
  Filled 2020-07-29: qty 1

## 2020-07-29 MED ORDER — INSULIN DETEMIR 100 UNIT/ML ~~LOC~~ SOLN
40.0000 [IU] | Freq: Two times a day (BID) | SUBCUTANEOUS | Status: DC
Start: 2020-07-29 — End: 2020-07-30
  Administered 2020-07-29: 40 [IU] via SUBCUTANEOUS
  Filled 2020-07-29 (×2): qty 0.4

## 2020-07-29 MED ORDER — VITAMIN B-12 1000 MCG PO TABS
1000.0000 ug | ORAL_TABLET | Freq: Every day | ORAL | Status: DC
  Filled 2020-07-29: qty 1

## 2020-07-29 MED ORDER — SIMVASTATIN 20 MG PO TABS
20.0000 mg | ORAL_TABLET | Freq: Every day | ORAL | Status: DC
  Administered 2020-07-29: 20 mg via ORAL
  Filled 2020-07-29: qty 1

## 2020-07-29 MED ORDER — PANTOPRAZOLE SODIUM 40 MG PO TBEC: 40.0000 mg | DELAYED_RELEASE_TABLET | Freq: Every day | ORAL | Status: DC

## 2020-07-29 MED ORDER — HALOPERIDOL 5 MG PO TABS
10.0000 mg | ORAL_TABLET | Freq: Every day | ORAL | Status: DC
  Administered 2020-07-29: 10 mg via ORAL
  Filled 2020-07-29: qty 2

## 2020-07-29 MED ORDER — GLIPIZIDE ER 10 MG PO TB24
10.0000 mg | ORAL_TABLET | Freq: Once | ORAL | Status: DC
  Filled 2020-07-29: qty 1

## 2020-07-29 MED ORDER — DULOXETINE HCL 30 MG PO CPEP: 60.0000 mg | ORAL_CAPSULE | Freq: Every day | ORAL | Status: DC

## 2020-07-29 MED ORDER — VITAMIN D3 25 MCG (1000 UNIT) PO TABS
1000.0000 [IU] | ORAL_TABLET | Freq: Every day | ORAL | Status: DC
  Filled 2020-07-29: qty 1

## 2020-07-29 MED ORDER — AMLODIPINE BESYLATE 5 MG PO TABS: 5.0000 mg | ORAL_TABLET | Freq: Every day | ORAL | Status: DC

## 2020-07-29 MED ORDER — IBUPROFEN 200 MG PO TABS
600.0000 mg | ORAL_TABLET | Freq: Three times a day (TID) | ORAL | Status: DC | PRN
  Administered 2020-07-29: 600 mg via ORAL
  Filled 2020-07-29: qty 3

## 2020-07-29 MED ORDER — HYDROXYZINE HCL 25 MG PO TABS
25.0000 mg | ORAL_TABLET | Freq: Three times a day (TID) | ORAL | Status: DC | PRN
  Administered 2020-07-29: 25 mg via ORAL
  Filled 2020-07-29: qty 1

## 2020-07-29 MED ORDER — PROPRANOLOL HCL 20 MG PO TABS
10.0000 mg | ORAL_TABLET | Freq: Two times a day (BID) | ORAL | Status: DC
  Administered 2020-07-29: 10 mg via ORAL
  Filled 2020-07-29: qty 1

## 2020-07-29 NOTE — ED Notes (Signed)
Pt changed into gown due to not having scrubs.   Belongings placed in 1 bag, placed under cabinet under ice machine in triage.  1 pair black shoes Jeans Black belt Shirt Green jacket Purse (pt wanted purse with belongings, not locked up.)

## 2020-07-29 NOTE — Telephone Encounter (Signed)
Called patient in regards to vaginal swab lab results. Patient did not answer. LM for her to call the office at her convenience or check her My Chart message.

## 2020-07-29 NOTE — ED Notes (Signed)
Pt continues to come out into the hall to desk asking for a bed. Stating she needs a bed. Pt informed that we have no beds for her to go to right now that we can recline her chair and make her more comfortable. Pt is now sitting in her room on her chair.

## 2020-07-29 NOTE — ED Notes (Signed)
Pt provided with a diet gingerale and ham sandwich per request. Pt given 2 warm blankets

## 2020-07-29 NOTE — ED Provider Notes (Signed)
[name] DEPT Provider Note   CSN: [id number] Arrival date & time: 07/29/20  1846     History Chief Complaint  Patient presents with  . Suicidal    [name] is a 30 y.o. female.  [name] was recently seen and evaluated by behavioral health here in the ED.  She has a history of bipolar disorder.  She presents stating that she plans to overdose on her medication.  She is also delusional and states that she is wearing pants that someone placed in her house, maybe another person or a demon.  The history is provided by the patient.  Mental Health Problem Presenting symptoms: delusional and suicidal thoughts   Degree of incapacity (severity):  Severe Onset quality:  Gradual Timing:  Constant Progression:  Worsening Chronicity:  Recurrent Context: not stressful life event   Treatment compliance:  All of the time Relieved by:  Nothing Worsened by:  Nothing Ineffective treatments:  Antipsychotics Associated symptoms: anxiety   Associated symptoms: no abdominal pain, no anhedonia and no chest pain        Past Medical History:  Diagnosis Date  . Anxiety   . Bipolar 1 disorder ([place])   . Cognitive deficits   . Depression   . Diabetes mellitus without complication ([place])   . Hypertension   . Mental disorder   . Mental health disorder   . Obesity     Patient Active Problem List   Diagnosis Date Noted  . Syphilis 07/15/2020  . Malingering 06/05/2020  . Gastroesophageal reflux disease 05/04/2020  . Hyperglycemia due to type 2 diabetes mellitus ([place]) 05/04/2020  . Long term (current) use of insulin ([place]) 05/04/2020  . Migraine without aura 05/04/2020  . Morbid obesity ([place]) 05/04/2020  . 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 2 diabetes mellitus ([place]) 05/04/2020  . Prolapsed internal hemorrhoids 05/04/2020  . Vitamin D deficiency 05/04/2020  . Other symptoms and signs involving cognitive functions and awareness 05/04/2020  . Suicide  attempt ([place])   . Anxiety state 03/06/2020  . Schizophrenia ([place]) 09/13/2019  . Bipolar I disorder, most recent episode depressed ([place]) 06/23/2019  . MDD (major depressive disorder) 10/10/2018  . Schizoaffective disorder, bipolar type ([place]) 09/25/2018  . Affective psychosis, bipolar ([place]) 06/13/2018  . HTN (hypertension) 05/03/2018  . Tobacco use disorder 05/03/2018  . Adjustment disorder with emotional disturbance 01/02/2018  . 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 Moderate bipolar I disorder, most recent episode depressed ()   . Psychosis ([place])   . 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 OCD (obsessive compulsive disorder) 10/05/2016  . Major depressive disorder, recurrent episode, mild ([place]) 05/04/2016  . 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 Learning disability 07/18/2015  . 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 Diabetes mellitus ([place]) 07/18/2015  . 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 Hyperlipidemia 07/18/2015  . Severe episode of recurrent major depressive disorder, without psychotic features ([place])   . Suicidal ideation   . Drug overdose   . Cognitive deficits 10/12/2012  . Generalized anxiety disorder 06/28/2012    Past Surgical History:  Procedure Laterality Date  . CESAREAN SECTION    . CESAREAN SECTION N/A 04/25/2013   Procedure: REPEAT CESAREAN SECTION;  Surgeon: [name], MD;  Location: [facility];  Service: Obstetrics;  Laterality: N/A;  . MASS EXCISION N/A 06/03/2012   Procedure: EXCISION MASS;  Surgeon: [name], MD;  Location: [facility];  Service: ENT;  Laterality: N/A;  Excision uvula mass  . TONSILLECTOMY N/A 06/03/2012   Procedure: TONSILLECTOMY;  Surgeon: [name], MD;  Location: [facility];  Service: ENT;  Laterality: N/A;  . TONSILLECTOMY       OB History     Gravida  3   Para  3   Term  3   Preterm  0   AB  0   Living  3     SAB  0   IAB  0   Ectopic  0   Multiple      Live Births  3           Family History  Problem Relation Age of Onset  . Hypertension Mother   . Diabetes Father     Social History   Tobacco Use  . Smoking status: Current Every Day Smoker    Packs/day: 4.00    Years: 11.00    Pack years: 44.00    Types: Cigarettes, Cigars  . Smokeless tobacco: Never Used  . Tobacco comment: Pt declined  Vaping Use  . Vaping Use: Never used  Substance Use Topics  . Alcohol use: Not Currently    Comment: occ: last intake 4 mts ago  . Drug use: Not Currently    Types: "Crack" cocaine, Other-see comments    Comment: Patient reports hx of smoking Crack    Home Medications Prior to Admission medications   Medication Sig Start Date End Date Taking? Authorizing Provider  amLODipine (NORVASC) 5 MG tablet Take 5 mg by mouth daily. Patient not taking: Reported on 07/23/2020    [provider]  benztropine (COGENTIN) 1 MG tablet Take 1 mg by mouth 2 (two) times daily. Patient not taking: Reported on 07/23/2020 05/01/20   [provider]  cholecalciferol (VITAMIN D) 25 MCG (1000 UNIT) tablet Take 1,000 Units by mouth daily. 04/30/20   [provider]  DULoxetine (CYMBALTA) 60 MG capsule Take 1 capsule (60 mg total) by mouth daily. Patient not taking: No sig reported 05/31/20   Money, Darnelle Maffucci B, FNP  glipiZIDE (GLUCOTROL XL) 10 MG 24 hr tablet Take 10 mg by mouth once. Patient not taking: No sig reported    [provider]  haloperidol (HALDOL) 10 MG tablet Take 10 mg by mouth at bedtime. Patient not taking: Reported on 07/23/2020 05/09/20   [provider]  haloperidol decanoate (HALDOL DECANOATE) 50 MG/ML injection Inject 50 mg into the muscle every 28 (twenty-eight) days. Patient not taking: Reported on 07/23/2020 03/19/20   [provider]  hydrOXYzine  (ATARAX/VISTARIL) 25 MG tablet Take 1 tablet (25 mg total) by mouth 3 (three) times daily as needed for anxiety. Patient not taking: Reported on 07/23/2020 05/27/20   Derrill Center, NP  insulin detemir (LEVEMIR) 100 UNIT/ML injection Inject 0.4 mLs (40 Units total) into the skin 2 (two) times daily. Patient not taking: Reported on 07/23/2020 05/05/20   Montine Circle, PA-C  omeprazole (PRILOSEC) 20 MG capsule Take 20 mg by mouth daily. Patient not taking: No sig reported 04/30/20   [provider]  propranolol (INDERAL) 10 MG tablet Take 1 tablet (10 mg total) by mouth 2 (two) times daily. Patient not taking: Reported on 07/23/2020 05/05/20 06/04/20  Montine Circle, PA-C  simvastatin (ZOCOR) 20 MG tablet Take 20 mg by mouth at bedtime. Patient not taking: Reported on 07/23/2020 08/21/19   [provider]  vitamin B-12 (CYANOCOBALAMIN) 1000 MCG tablet Take  1,000 mcg by mouth daily. Patient not taking: Reported on 07/23/2020 11/13/19   [provider]    Allergies    Wellbutrin [bupropion], Omnipaque [iohexol], Penicillins, Cogentin [benztropine], Depakote er [divalproex sodium er], and Penicillin g  Review of Systems   Review of Systems  Constitutional: Negative for chills and fever.  HENT: Negative for ear pain and sore throat.   Eyes: Negative for pain and visual disturbance.  Respiratory: Negative for cough and shortness of breath.   Cardiovascular: Negative for chest pain and palpitations.  Gastrointestinal: Negative for abdominal pain and vomiting.  Genitourinary: Negative for dysuria and hematuria.  Musculoskeletal: Negative for arthralgias and back pain.  Skin: Negative for color change and rash.  Neurological: Negative for seizures and syncope.  Psychiatric/Behavioral: Positive for suicidal ideas. The patient is nervous/anxious.   All other systems reviewed and are negative.   Physical Exam Updated Vital Signs BP (!) 144/89   Pulse 86   Temp 98.6 F (37  C) (Oral)   Resp 16   Ht 5\' 7"  (1.702 m)   Wt 115.2 kg   SpO2 100%   BMI 39.78 kg/m   Physical Exam Vitals and nursing note reviewed.  HENT:     Head: Normocephalic and atraumatic.  Eyes:     General: No scleral icterus. Pulmonary:     Effort: Pulmonary effort is normal. No respiratory distress.  Musculoskeletal:     Cervical back: Normal range of motion.  Skin:    General: Skin is warm and dry.  Neurological:     General: No focal deficit present.     Mental Status: She is alert. Mental status is at baseline.  Psychiatric:        Attention and Perception: Attention normal.        Mood and Affect: Affect is labile.        Speech: Speech is rapid and pressured.        Behavior: Behavior is agitated.        Thought Content: Thought content is delusional. Thought content includes suicidal ideation. Thought content includes suicidal plan.        Judgment: Judgment is impulsive.     ED Results / Procedures / Treatments   Labs (all labs ordered are listed, but only abnormal results are displayed) Labs Reviewed  COMPREHENSIVE METABOLIC PANEL - Abnormal; Notable for the following components:      Result Value   Glucose, Bld 167 (*)    All other components within normal limits  RAPID URINE DRUG SCREEN, HOSP PERFORMED - Abnormal; Notable for the following components:   Barbiturates POSITIVE (*)    All other components within normal limits  CBC WITH DIFFERENTIAL/PLATELET - Abnormal; Notable for the following components:   Hemoglobin 10.5 (*)    HCT 33.6 (*)    MCV 78.0 (*)    MCH 24.4 (*)    All other components within normal limits  RESP PANEL BY RT-PCR (FLU A&B, COVID) ARPGX2  ETHANOL  I-STAT BETA HCG BLOOD, ED ([facility], [facility], [facility] ONLY)    EKG EKG Interpretation  Date/Time:  Monday July 29 2020 21:07:29 EDT Ventricular Rate:  78 PR Interval:  174 QRS Duration: 89 QT Interval:  385 QTC Calculation: 439 R Axis:   50 Text Interpretation: Sinus rhythm 12 Lead;  Mason-Likar normal QTc No acute ischemia. Confirmed by Lorre Munroe ([id number]) on 07/29/2020 10:09:12 PM   Radiology No results found.  Procedures Procedures   Medications Ordered in ED Medications  ibuprofen (  ADVIL) tablet 600 mg (has no administration in time range)  amLODipine (NORVASC) tablet 5 mg (has no administration in time range)  benztropine (COGENTIN) tablet 1 mg (has no administration in time range)  cholecalciferol (VITAMIN D) tablet 1,000 Units (has no administration in time range)  DULoxetine (CYMBALTA) DR capsule 60 mg (has no administration in time range)  glipiZIDE (GLUCOTROL XL) 24 hr tablet 10 mg (has no administration in time range)  haloperidol (HALDOL) tablet 10 mg (has no administration in time range)  hydrOXYzine (ATARAX/VISTARIL) tablet 25 mg (has no administration in time range)  insulin detemir (LEVEMIR) injection 40 Units (has no administration in time range)  pantoprazole (PROTONIX) EC tablet 40 mg (has no administration in time range)  propranolol (INDERAL) tablet 10 mg (has no administration in time range)  simvastatin (ZOCOR) tablet 20 mg (has no administration in time range)  vitamin B-12 (CYANOCOBALAMIN) tablet 1,000 mcg (has no administration in time range)    ED Course  I have reviewed the triage vital signs and the nursing notes.  Pertinent labs & imaging results that were available during my care of the patient were reviewed by me and considered in my medical decision making (see chart for details).    MDM Rules/Calculators/A&P                          Tresea Sashia Campas presents with suicidal ideation and a plan.  She also appears to be suffering from some delusions.  She is experiencing an exacerbation of known, chronic mental health conditions.  Because she was suicidal with an active plan, I did complete IVC paperwork.  She appears to be medically stable and will be evaluated by behavioral health. Final Clinical Impression(s) / ED Diagnoses Final  diagnoses:  Suicidal ideation  Psychosis, unspecified psychosis type [facility])    Rx / DC Orders ED Discharge Orders    None       Arnaldo Natal, MD 07/29/20 2210

## 2020-07-29 NOTE — Telephone Encounter (Signed)
-----   Message from Griffin Basil, MD sent at 07/29/2020  8:39 AM EDT ----- Yeast infection noted, will offer treatment

## 2020-07-29 NOTE — ED Notes (Signed)
Pt refused glipizide, states MD took her off of it months ago. Pt states it make her sick. Medication sent back to pharmacy.

## 2020-07-29 NOTE — ED Triage Notes (Signed)
Pt sts suicidal ideation with plan of overdosing on medications she has available at the house. Pt also sts anxiety because she does not know who's pants she is wearing. Pt admits since they fit she decided to wear them anyways.

## 2020-07-30 MED ORDER — LORAZEPAM 1 MG PO TABS
1.0000 mg | ORAL_TABLET | Freq: Once | ORAL | Status: AC
  Administered 2020-07-30: 1 mg via ORAL
  Filled 2020-07-30: qty 1

## 2020-07-30 NOTE — ED Provider Notes (Addendum)
Emergency Medicine Observation Re-evaluation Note  [name] is a 30 y.o. female, seen on rounds today.  Pt initially presented to the ED for complaints of Suicidal Currently, the patient is waiting for psychiatric evaluation.  Physical Exam  BP 134/88 (BP Location: Left Arm)   Pulse 85   Temp 98.6 F (37 C) (Oral)   Resp 17   Ht 1.702 m (5\' 7" )   Wt 115.2 kg   SpO2 100%   BMI 39.78 kg/m  Physical Exam General: Alert Cardiac: Regular rate Lungs: Breathing easily Psych: Anxious  ED Course / MDM  EKG:EKG Interpretation  Date/Time:  Monday July 29 2020 21:07:29 EDT Ventricular Rate:  78 PR Interval:  174 QRS Duration: 89 QT Interval:  385 QTC Calculation: 439 R Axis:   50 Text Interpretation: Sinus rhythm 12 Lead; Mason-Likar normal QTc No acute ischemia. Confirmed by Lorre Munroe ([id number]) on 07/29/2020 10:09:12 PM   I have reviewed the labs performed to date as well as medications administered while in observation.  Recent changes in the last 24 hours include patient requesting medication for anxiety.  Plan  Current plan is for psychiatric evaluation, Ativan was ordered.  10:13 AM patient has been by psychiatry this morning.  She is cleared for discharge and outpatient treatment    Dorie Rank, MD 07/30/20 1013

## 2020-07-30 NOTE — Discharge Instructions (Addendum)
Continue medications.  Follow-up with a psychiatrist as recommended

## 2020-07-30 NOTE — BH Assessment (Signed)
Comprehensive Clinical Assessment (CCA) Note  07/30/2020 [name] [id number]   Disposition: Per Dr. Dwyane Dee patient is at baseline and does not meet in patient care criteria. Patient's [facility] and [facility] ED staff notified of disposition. ACTT RN to pick up from ED.  The patient demonstrates the following risk factors for suicide: Chronic risk factors for suicide include: psychiatric disorder of schizoaffective disorder and previous suicide attempts several. Acute risk factors for suicide include: social withdrawal/isolation. Protective factors for this patient include: positive social support and positive therapeutic relationship. Considering these factors, the overall suicide risk at this point appears to be low. Patient is appropriate for outpatient follow up.  Flowsheet Row ED from 07/29/2020 in [facility] DEPT ED from 07/23/2020 in [facility] DEPT ED from 07/03/2020 in [facility] DEPT  C-SSRS RISK CATEGORY High Risk High Risk No Risk      Therefore, patient does not pose risk for suicide and sitter precautions not recommended.  Patient is a 30 year old female presenting voluntarily to [facility] ED reporting suicidal ideation with a plan to overdose. Her SI is triggered by not wanting to be in her living situation. Patient is well-known to [facility] service line and ED due to multiple presentations. This is patient's 12th ED visit for SI in 2022 alone. Patient is chronically suicidal. She is currently endorsing SI with a plan to overdose due to bad nightmares she is having. Patient also reports paranoia that someone is in her home because there is a pair of jeans in her house that she does not remember buying. Patient is delusional at baseline. She has a history of seeking hospitalization for secondary gain.  This counselor contacted [facility]T [phone]: Spoke with ACTT member, Doctor, general practice. This counselor  explained patient's presentation and the team is comfortable with discharge to their care. He states that ACTT RN, Rodman Pickle, will pick patient up.  Chief Complaint:  Chief Complaint  Patient presents with  . Suicidal   Visit Diagnosis: Schizoaffective disorder, bipolar type  CCA Biopsychosocial Intake/Chief Complaint:  NA  Current Symptoms/Problems: NA   Patient Reported Schizophrenia/Schizoaffective Diagnosis in Past: Yes   Strengths: NA  Preferences: NA  Abilities: NA   Type of Services Patient Feels are Needed: NA   Initial Clinical Notes/Concerns: NA   Mental Health Symptoms Depression:  Change in energy/activity; Hopelessness; Increase/decrease in appetite; Irritability; Sleep (too much or little); Weight gain/loss; Worthlessness; Fatigue; Tearfulness   Duration of Depressive symptoms: Greater than two weeks   Mania:  N/A   Anxiety:   N/A   Psychosis:  Delusions   Duration of Psychotic symptoms: No data recorded  Trauma:  None   Obsessions:  None   Compulsions:  None   Inattention:  None   Hyperactivity/Impulsivity:  N/A   Oppositional/Defiant Behaviors:  None   Emotional Irregularity:  Recurrent suicidal behaviors/gestures/threats; Mood lability; Unstable self-image   Other Mood/Personality Symptoms:  NA    Mental Status Exam Appearance and self-care  Stature:  Tall   Weight:  Overweight   Clothing:  Neat/clean; Age-appropriate   Grooming:  Normal   Cosmetic use:  Age appropriate   Posture/gait:  Normal   Motor activity:  Not Remarkable   Sensorium  Attention:  Normal   Concentration:  Normal   Orientation:  X5   Recall/memory:  Normal   Affect and Mood  Affect:  Depressed   Mood:  Depressed   Relating  Eye contact:  Normal  Facial expression:  Responsive   Attitude toward examiner:  Cooperative   Thought and Language  Speech flow: Loud; Clear and Coherent   Thought content:  Appropriate to Mood and  Circumstances   Preoccupation:  None   Hallucinations:  None   Organization:  No data recorded  [employer] of Knowledge:  Average   Intelligence:  Average   Abstraction:  Normal   Judgement:  Fair   Reality Testing:  Adequate   Insight:  Fair   Decision Making:  Vacilates   Social Functioning  Social Maturity:  Isolates   Social Judgement:  Victimized   Stress  Stressors:  Housing; Relationship   Coping Ability:  Exhausted   Skill Deficits:  Decision making   Supports:  Other (Comment); Family     Religion: Religion/Spirituality Are You A Religious Person?: Yes  Leisure/Recreation: Leisure / Recreation Do You Have Hobbies?: No  Exercise/Diet: Exercise/Diet Do You Exercise?: Yes Have You Gained or Lost A Significant Amount of Weight in the Past Six Months?: No Do You Follow a Special Diet?: No Do You Have Any Trouble Sleeping?: Yes   CCA Employment/Education Employment/Work Situation: Employment / Work Situation Employment situation: On disability Why is patient on disability: schizoaffective disorder How long has patient been on disability: UTA Patient's job has been impacted by current illness: No What is the longest time patient has a held a job?: UTA Where was the patient employed at that time?: UTA Has patient ever been in the [employer]?: No  Education: Education Last Grade Completed:  (not assessed) Name of [place]: not assessed Did Teacher, adult education From [employer]?: No (not assessed) Did You Attend College?: No Did You Attend Graduate School?: No (not assessed) Did You Have Any Special Interests In School?: not assessed Did You Have An Individualized Education Program (IIEP): No Did You Have Any Difficulty At School?: No Patient's Education Has Been Impacted by Current Illness: No   CCA Family/Childhood History Family and Relationship History: Family history Marital status: Single Are you sexually active?:  Yes What is your sexual orientation?: heterosexual Has your sexual activity been affected by drugs, alcohol, medication, or emotional stress?: Client denies.  Does patient have children?: Yes How many children?: 1 How is patient's relationship with their children?: UTA  Childhood History:  Childhood History By whom was/is the patient raised?: Mother Additional childhood history information: Patient reports "I only seen him (father) about three times in my life and now he don't want nothing to do with me." Description of patient's relationship with caregiver when they were a child: Patient reports "it was good, she raised me good".  How were you disciplined when you got in trouble as a child/adolescent?: Patient reports "nothing". Does patient have siblings?: No Did patient suffer any verbal/emotional/physical/sexual abuse as a child?: No Did patient suffer from severe childhood neglect?: No Has patient ever been sexually abused/assaulted/raped as an adolescent or adult?: No Was the patient ever a victim of a crime or a disaster?: No Witnessed domestic violence?: No Has patient been affected by domestic violence as an adult?: No  Child/Adolescent Assessment:     CCA Substance Use Alcohol/Drug Use: Alcohol / Drug Use Pain Medications: Please see MAR Prescriptions: Please see MAR Over the Counter: Please see MAR History of alcohol / drug use?: No history of alcohol / drug abuse Longest period of sobriety (when/how long): NA Negative Consequences of Use:  (Denies)  ASAM's:  Six Dimensions of Multidimensional Assessment  Dimension 1:  Acute Intoxication and/or Withdrawal Potential:      Dimension 2:  Biomedical Conditions and Complications:      Dimension 3:  Emotional, Behavioral, or Cognitive Conditions and Complications:     Dimension 4:  Readiness to Change:     Dimension 5:  Relapse, Continued use, or Continued Problem Potential:      Dimension 6:  Recovery/Living Environment:     ASAM Severity Score:    ASAM Recommended Level of Treatment:     Substance use Disorder (SUD)    Recommendations for Services/Supports/Treatments:    DSM5 Diagnoses: Patient Active Problem List   Diagnosis Date Noted  . Syphilis 07/15/2020  . Malingering 06/05/2020  . Gastroesophageal reflux disease 05/04/2020  . Hyperglycemia due to type 2 diabetes mellitus ([place]) 05/04/2020  . Long term (current) use of insulin ([place]) 05/04/2020  . Migraine without aura 05/04/2020  . Morbid obesity ([place]) 05/04/2020  . 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 2 diabetes mellitus ([place]) 05/04/2020  . Prolapsed internal hemorrhoids 05/04/2020  . Vitamin D deficiency 05/04/2020  . Other symptoms and signs involving cognitive functions and awareness 05/04/2020  . Suicide attempt ([place])   . Anxiety state 03/06/2020  . Schizophrenia ([place]) 09/13/2019  . Bipolar I disorder, most recent episode depressed ([place]) 06/23/2019  . MDD (major depressive disorder) 10/10/2018  . Schizoaffective disorder, bipolar type ([place]) 09/25/2018  . Affective psychosis, bipolar ([place]) 06/13/2018  . HTN (hypertension) 05/03/2018  . Tobacco use disorder 05/03/2018  . Adjustment disorder with emotional disturbance 01/02/2018  . 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 Moderate bipolar I disorder, most recent episode depressed ([place])   . Psychosis ([place])   . 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 OCD (obsessive compulsive disorder) 10/05/2016  . Major depressive disorder, recurrent episode, mild ([place]) 05/04/2016  . 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 Learning disability 07/18/2015  . 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 Diabetes mellitus ([place]) 07/18/2015  . 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 Hyperlipidemia 07/18/2015  . Severe episode of recurrent  major depressive disorder, without psychotic features ([place])   . Suicidal ideation   . Drug overdose   . Cognitive deficits 10/12/2012  . Generalized anxiety disorder 06/28/2012    Patient Centered Plan: Patient is on the following Treatment Plan(s):  Referrals to Alternative Service(s): Referred to Alternative Service(s):   Place:   Date:   Time:    Referred to Alternative Service(s):   Place:   Date:   Time:    Referred to Alternative Service(s):   Place:   Date:   Time:    Referred to Alternative Service(s):   Place:   Date:   Time:     Orvis Brill, LCSW

## 2020-07-30 NOTE — B[facility] Counselor (Signed)
Patient assessed by TTS and per Dr. Dwyane Dee patient is psych cleared. This counselor to notify ACTT to p/u.

## 2020-07-30 NOTE — BH Assessment (Signed)
TTS spoke with Court from [facility] and notified of presentation as well as disposition. He confirms that Theodoro Parma, RN with [facility] will come to pick her up from [facility] ED.

## 2020-07-30 NOTE — ED Notes (Addendum)
Per triage staff, Pt left w/ [facility] team and NAD noted.

## 2020-08-12 ENCOUNTER — Emergency Department (HOSPITAL_COMMUNITY)
Admission: EM | Admit: 2020-08-12 | Discharge: 2020-08-12 | Disposition: A | Discharge status: Other Acute Inpatient Hospital | Payer: Medicaid Other | Attending: Emergency Medicine | Admitting: Emergency Medicine

## 2020-08-12 ENCOUNTER — Ambulatory Visit (HOSPITAL_COMMUNITY)
Admission: EM | Admit: 2020-08-12 | Discharge: 2020-08-13 | Disposition: A | Discharge status: Home / Self Care | Payer: Medicaid Other | Source: Home / Self Care

## 2020-08-12 ENCOUNTER — Encounter (HOSPITAL_COMMUNITY): Payer: Self-pay | Admitting: Emergency Medicine

## 2020-08-12 ENCOUNTER — Other Ambulatory Visit: Payer: Self-pay

## 2020-08-12 DIAGNOSIS — R45851 Suicidal ideations: Secondary | ICD-10-CM | POA: Insufficient documentation

## 2020-08-12 DIAGNOSIS — E1142 Type 2 diabetes mellitus with diabetic polyneuropathy: Secondary | ICD-10-CM | POA: Insufficient documentation

## 2020-08-12 DIAGNOSIS — R451 Restlessness and agitation: Secondary | ICD-10-CM | POA: Diagnosis present

## 2020-08-12 DIAGNOSIS — Z20822 Contact with and (suspected) exposure to covid-19: Secondary | ICD-10-CM | POA: Insufficient documentation

## 2020-08-12 DIAGNOSIS — I10 Essential (primary) hypertension: Secondary | ICD-10-CM | POA: Diagnosis not present

## 2020-08-12 DIAGNOSIS — F259 Schizoaffective disorder, unspecified: Secondary | ICD-10-CM | POA: Diagnosis not present

## 2020-08-12 DIAGNOSIS — Z7984 Long term (current) use of oral hypoglycemic drugs: Secondary | ICD-10-CM | POA: Diagnosis not present

## 2020-08-12 DIAGNOSIS — F17210 Nicotine dependence, cigarettes, uncomplicated: Secondary | ICD-10-CM | POA: Insufficient documentation

## 2020-08-12 DIAGNOSIS — Z9151 Personal history of suicidal behavior: Secondary | ICD-10-CM | POA: Insufficient documentation

## 2020-08-12 DIAGNOSIS — F250 Schizoaffective disorder, bipolar type: Secondary | ICD-10-CM

## 2020-08-12 DIAGNOSIS — Z79899 Other long term (current) drug therapy: Secondary | ICD-10-CM | POA: Insufficient documentation

## 2020-08-12 DIAGNOSIS — Z794 Long term (current) use of insulin: Secondary | ICD-10-CM | POA: Insufficient documentation

## 2020-08-12 LAB — I-STAT BETA HCG BLOOD, ED (MC, WL, AP ONLY): I-stat hCG, quantitative: <5 m[IU]/mL (ref <5)

## 2020-08-12 LAB — CBC WITH DIFFERENTIAL/PLATELET
Abs Immature Granulocytes: 0.03 10*3/uL (ref 0.00–0.07)
Basophils Absolute: 0.1 10*3/uL (ref 0.0–0.1)
Basophils Relative: 1 %
Eosinophils Absolute: 0.4 10*3/uL (ref 0.0–0.5)
Eosinophils Relative: 4 %
HCT: 38.4 % (ref 36.0–46.0)
Hemoglobin: 11.8 g/dL — ABNORMAL LOW (ref 12.0–15.0)
Immature Granulocytes: 0 %
Lymphocytes Relative: 35 %
Lymphs Abs: 3.6 10*3/uL (ref 0.7–4.0)
MCH: 24 pg — ABNORMAL LOW (ref 26.0–34.0)
MCHC: 30.7 g/dL (ref 30.0–36.0)
MCV: 78 fL — ABNORMAL LOW (ref 80.0–100.0)
Monocytes Absolute: 0.4 10*3/uL (ref 0.1–1.0)
Monocytes Relative: 4 %
Neutro Abs: 5.6 10*3/uL (ref 1.7–7.7)
Neutrophils Relative %: 56 %
Platelets: 225 10*3/uL (ref 150–400)
RBC: 4.92 MIL/uL (ref 3.87–5.11)
RDW: 15.7 % — ABNORMAL HIGH (ref 11.5–15.5)
WBC: 10.1 10*3/uL (ref 4.0–10.5)
nRBC: 0 % (ref 0.0–0.2)

## 2020-08-12 LAB — COMPREHENSIVE METABOLIC PANEL
ALT: 19 U/L (ref 0–44)
AST: 25 U/L (ref 15–41)
Albumin: 4.2 g/dL (ref 3.5–5.0)
Alkaline Phosphatase: 52 U/L (ref 38–126)
Anion gap: 8 (ref 5–15)
BUN: 11 mg/dL (ref 6–20)
CO2: 25 mmol/L (ref 22–32)
Calcium: 9.4 mg/dL (ref 8.9–10.3)
Chloride: 108 mmol/L (ref 98–111)
Creatinine, Ser: 0.79 mg/dL (ref 0.44–1.00)
GFR, Estimated: >60 mL/min (ref >60)
Glucose, Bld: 156 mg/dL — ABNORMAL HIGH (ref 70–99)
Potassium: 3.8 mmol/L (ref 3.5–5.1)
Sodium: 141 mmol/L (ref 135–145)
Total Bilirubin: 0.6 mg/dL (ref 0.3–1.2)
Total Protein: 7.9 g/dL (ref 6.5–8.1)

## 2020-08-12 LAB — ETHANOL: Alcohol, Ethyl (B): <10 mg/dL (ref <10)

## 2020-08-12 LAB — RESP PANEL BY RT-PCR (FLU A&B, COVID) ARPGX2
Influenza A by PCR: NEGATIVE
Influenza B by PCR: NEGATIVE
SARS Coronavirus 2 by RT PCR: NEGATIVE

## 2020-08-12 LAB — RAPID URINE DRUG SCREEN, HOSP PERFORMED
Amphetamines: NOT DETECTED
Barbiturates: POSITIVE — AB
Benzodiazepines: NOT DETECTED
Cocaine: NOT DETECTED
Opiates: NOT DETECTED
Tetrahydrocannabinol: NOT DETECTED

## 2020-08-12 LAB — GLUCOSE, CAPILLARY: Glucose-Capillary: 175 mg/dL — ABNORMAL HIGH (ref 70–99)

## 2020-08-12 LAB — CBG MONITORING, ED: Glucose-Capillary: 157 mg/dL — ABNORMAL HIGH (ref 70–99)

## 2020-08-12 MED ORDER — SIMVASTATIN 20 MG PO TABS: 20.0000 mg | ORAL_TABLET | Freq: Every day | ORAL | Status: DC

## 2020-08-12 MED ORDER — AMLODIPINE BESYLATE 5 MG PO TABS: 5.0000 mg | ORAL_TABLET | Freq: Every day | ORAL | Status: DC

## 2020-08-12 MED ORDER — HALOPERIDOL 5 MG PO TABS: 10.0000 mg | ORAL_TABLET | Freq: Every day | ORAL | Status: DC

## 2020-08-12 MED ORDER — PROPRANOLOL HCL 20 MG PO TABS
10.0000 mg | ORAL_TABLET | Freq: Two times a day (BID) | ORAL | Status: DC
Start: 2020-08-12 — End: 2020-08-12

## 2020-08-12 MED ORDER — BENZTROPINE MESYLATE 1 MG PO TABS: 1.0000 mg | ORAL_TABLET | Freq: Two times a day (BID) | ORAL | Status: DC

## 2020-08-12 MED ORDER — INSULIN ASPART 100 UNIT/ML ~~LOC~~ SOLN: 0.0000 [IU] | Freq: Three times a day (TID) | SUBCUTANEOUS | Status: DC

## 2020-08-12 MED ORDER — BENZTROPINE MESYLATE 1 MG PO TABS
1.0000 mg | ORAL_TABLET | Freq: Two times a day (BID) | ORAL | Status: DC
  Administered 2020-08-12 – 2020-08-13 (×2): 1 mg via ORAL
  Filled 2020-08-12 (×2): qty 1

## 2020-08-12 MED ORDER — SIMVASTATIN 20 MG PO TABS
20.0000 mg | ORAL_TABLET | Freq: Every day | ORAL | Status: DC
  Administered 2020-08-12: 20 mg via ORAL
  Filled 2020-08-12: qty 1

## 2020-08-12 MED ORDER — ACETAMINOPHEN 325 MG PO TABS: 650.0000 mg | ORAL_TABLET | Freq: Four times a day (QID) | ORAL | Status: DC | PRN

## 2020-08-12 MED ORDER — HALOPERIDOL LACTATE 5 MG/ML IJ SOLN
5.0000 mg | Freq: Once | INTRAMUSCULAR | Status: AC
  Administered 2020-08-12: 5 mg via INTRAMUSCULAR
  Filled 2020-08-12: qty 1

## 2020-08-12 MED ORDER — HALOPERIDOL 5 MG PO TABS
10.0000 mg | ORAL_TABLET | Freq: Every day | ORAL | Status: DC
  Administered 2020-08-12: 10 mg via ORAL
  Filled 2020-08-12: qty 2

## 2020-08-12 MED ORDER — DULOXETINE HCL 60 MG PO CPEP
60.0000 mg | ORAL_CAPSULE | Freq: Every day | ORAL | Status: DC
  Administered 2020-08-13: 60 mg via ORAL
  Filled 2020-08-12: qty 1

## 2020-08-12 MED ORDER — AMLODIPINE BESYLATE 5 MG PO TABS
5.0000 mg | ORAL_TABLET | Freq: Every day | ORAL | Status: DC
  Administered 2020-08-13: 5 mg via ORAL
  Filled 2020-08-12: qty 1

## 2020-08-12 MED ORDER — GLIPIZIDE ER 10 MG PO TB24: 10.0000 mg | ORAL_TABLET | Freq: Once | ORAL | Status: DC

## 2020-08-12 MED ORDER — PROPRANOLOL HCL 10 MG PO TABS
10.0000 mg | ORAL_TABLET | Freq: Two times a day (BID) | ORAL | Status: DC
  Administered 2020-08-12 – 2020-08-13 (×2): 10 mg via ORAL
  Filled 2020-08-12 (×2): qty 1

## 2020-08-12 MED ORDER — ALUM & MAG HYDROXIDE-SIMETH 200-200-20 MG/5ML PO SUSP: 30.0000 mL | ORAL | Status: DC | PRN

## 2020-08-12 MED ORDER — INSULIN DETEMIR 100 UNIT/ML ~~LOC~~ SOLN
40.0000 [IU] | Freq: Two times a day (BID) | SUBCUTANEOUS | Status: DC
  Filled 2020-08-12: qty 0.4

## 2020-08-12 MED ORDER — DULOXETINE HCL 30 MG PO CPEP: 60.0000 mg | ORAL_CAPSULE | Freq: Every day | ORAL | Status: DC

## 2020-08-12 MED ORDER — INSULIN DETEMIR 100 UNIT/ML ~~LOC~~ SOLN
40.0000 [IU] | Freq: Two times a day (BID) | SUBCUTANEOUS | Status: DC
  Administered 2020-08-12 – 2020-08-13 (×2): 40 [IU] via SUBCUTANEOUS

## 2020-08-12 MED ORDER — MAGNESIUM HYDROXIDE 400 MG/5ML PO SUSP: 30.0000 mL | Freq: Every day | ORAL | Status: DC | PRN

## 2020-08-12 MED ORDER — LORAZEPAM 2 MG/ML IJ SOLN
2.0000 mg | Freq: Once | INTRAMUSCULAR | Status: AC
  Administered 2020-08-12: 2 mg via INTRAMUSCULAR
  Filled 2020-08-12: qty 1

## 2020-08-12 NOTE — BH Assessment (Signed)
Comprehensive Clinical Assessment (CCA) Note  08/12/2020 [name] [id number] Disposition: Per [place] NP patient will continue to be observed and monitored at [facility]. Charge nurse to be notified of disposition.    Flowsheet Row ED from 08/12/2020 in [facility]PT ED from 07/29/2020 in [facility] ED from 07/23/2020 in [facility]PT  C-SSRS RISK CATEGORY High Risk High Risk High Risk    The patient demonstrates the following risk factors for suicide: Chronic risk factors for suicide include: psychiatric disorder of depression. Acute risk factors for suicide include: social withdrawal/isolation. Protective factors for this patient include: coping skills. Considering these factors, the overall suicide risk at this point appears to be high. Patient is appropriate for outpatient follow up.   Patient is a 30 year old that presents voluntary this date by EMS to [facility] with S/I. Patient reports she has a plan to overdose on her medications. Patient denies any H/I or AVH.  Patient has a history of Schizoaffective disorder and is well known to area EDs. Patient cannot identify any immediate stressors although reports she presents today "because people don't like her and she can't get along with her mother." Patient providers limited history and is observed to cover her head up as this writer attempts to assess. Information to complete assessment was obtained from admission notes and chart review.    Patient was last assessed by TTS on 07/29/20: Per that note on that date. Patient is a 30 year old female presenting voluntarily to [facility] ED reporting suicidal ideation with a plan to overdose. Her SI is triggered by not wanting to be in her living situation. Patient is well-known to [facility] service line and ED due to multiple presentations. This is patient's 12th ED visit for SI in 2022 alone. Patient is chronically suicidal. She  is currently endorsing SI with a plan to overdose due to bad nightmares she is having. Patient also reports paranoia that someone is in her home because there is a pair of jeans in her house that she does not remember buying. Patient is delusional at baseline. She has a history of seeking hospitalization for secondary gain. Patient did not meet inpatient criteria at that time.   Patient is currently receiving services from [facility] [id number].   Patient is observed to be agitated and will not respond to orientation questions. Patient's memory appears to be intact although thoughts are somewhat disorganized. Patient does not appear to be responding to internal stimuli.    Chief Complaint:  Chief Complaint  Patient presents with  . Suicidal   Visit Diagnosis: Schizoaffective disorder     CCA Screening, Triage and Referral (STR)  Patient Reported Information How did you hear about [place]? Self  Referral name: Self  Referral phone number: 0 (N/A)   Whom do you see for routine medical problems? Other (Comment)  Practice/Facility Name: Urgent Care  Practice/Facility Phone Number: [phone] (Unknown)  Name of Contact: [facility]  Contact Number: Unknown  Contact Fax Number: Unknown  Prescriber Name: [name]  Prescriber Address (if known): Unknown   What Is the Reason for Your Visit/Call Today? Pt states, "I have a lot of anxiety and depression going on." Pt shares this started 2 days ago. She currently has a plan to o/d.  How Long Has This Been Causing You Problems? <Week  What Do You Feel Would Help You the Most Today? Medication(s)   Have You Recently Been in Any Inpatient  Treatment (Hospital/Detox/Crisis Center/28-Day Program)? Yes  Name/Location of Program/Hospital:[facility]  How Long Were You There? 5 days  When Were You Discharged? 05/04/2020   Have You Ever Received Services From [employer] Before? Yes  Who Do You See at [facility]? [facility] inpatient  treatment   Have You Recently Had Any Thoughts About Hurting Yourself? Yes  Are You Planning to Commit Suicide/Harm Yourself At This time? Yes   Have you Recently Had Thoughts About Hurting Someone [name]? No  Explanation: No data recorded  Have You Used Any Alcohol or Drugs in the Past 24 Hours? No  How Long Ago Did You Use Drugs or Alcohol? No data recorded What Did You Use and How Much? No data recorded  Do You Currently Have a Therapist/Psychiatrist? Yes  Name of Therapist/Psychiatrist: [facility]   Have You Been Recently Discharged From Any Office Practice or Programs? No  Explanation of Discharge From Practice/Program: No data recorded    CCA Screening Triage Referral Assessment Type of Contact: Face-to-Face  Is this Initial or Reassessment? Initial Assessment  Date Telepsych consult ordered in [facility]:  08/12/2020  Time Telepsych consult ordered in [facility]:  Spray   Patient Reported Information Reviewed? Yes  Patient Left Without Being Seen? No data recorded Reason for Not Completing Assessment: No data recorded  Collateral Involvement: Pt declined having anyone clinician could make contact with for collateral information   Does Patient Have a [place]? No data recorded Name and Contact of Legal Guardian: Self.   If Minor and Not Living with Parent(s), Who has Custody? N/A  Is CPS involved or ever been involved? Never  Is APS involved or ever been involved? Never   Patient Determined To Be At Risk for Harm To Self or Others Based on Review of Patient Reported Information or Presenting Complaint? Yes, for Self-Harm  Method: No data recorded Availability of Means: No data recorded Intent: No data recorded Notification Required: No data recorded Additional Information for Danger to Others Potential: No data recorded Additional Comments for Danger to Others Potential: No data recorded Are There Guns or Other Weapons in Your Home? No data  recorded Types of Guns/Weapons: No data recorded Are These Weapons Safely Secured?                            No data recorded Who Could Verify You Are Able To Have These Secured: No data recorded Do You Have any Outstanding Charges, Pending Court Dates, Parole/Probation? No data recorded Contacted To Inform of Risk of Harm To Self or Others: Other: Comment (NA)   Location of Assessment: [facility] ED   Does Patient Present under Involuntary Commitment? No  IVC Papers Initial File Date: No data recorded  [place] of Residence: [place]   Patient Currently Receiving the Following Services: Medication Management   Determination of Need: Routine (7 days)   Options For Referral: Outpatient Therapy     CCA Biopsychosocial Intake/Chief Complaint:  NA  Current Symptoms/Problems: NA   Patient Reported Schizophrenia/Schizoaffective Diagnosis in Past: Yes   Strengths: NA  Preferences: NA  Abilities: NA   Type of Services Patient Feels are Needed: NA   Initial Clinical Notes/Concerns: NA   Mental Health Symptoms Depression:  Change in energy/activity; Hopelessness; Increase/decrease in appetite; Irritability; Sleep (too much or little); Weight gain/loss; Worthlessness; Fatigue; Tearfulness   Duration of Depressive symptoms: Greater than two weeks   Mania:  N/A   Anxiety:  N/A   Psychosis:  Delusions   Duration of Psychotic symptoms: No data recorded  Trauma:  None   Obsessions:  None   Compulsions:  None   Inattention:  None   Hyperactivity/Impulsivity:  N/A   Oppositional/Defiant Behaviors:  None   Emotional Irregularity:  Recurrent suicidal behaviors/gestures/threats; Mood lability; Unstable self-image   Other Mood/Personality Symptoms:  NA    Mental Status Exam Appearance and self-care  Stature:  Tall   Weight:  Overweight   Clothing:  Neat/clean; Age-appropriate   Grooming:  Normal   Cosmetic use:  Age appropriate   Posture/gait:  Normal    Motor activity:  Not Remarkable   Sensorium  Attention:  Normal   Concentration:  Normal   Orientation:  X5   Recall/memory:  Normal   Affect and Mood  Affect:  Depressed   Mood:  Depressed   Relating  Eye contact:  Normal   Facial expression:  Responsive   Attitude toward examiner:  Cooperative   Thought and Language  Speech flow: Loud; Clear and Coherent   Thought content:  Appropriate to Mood and Circumstances   Preoccupation:  None   Hallucinations:  None   Organization:  No data recorded  [employer] of Knowledge:  Average   Intelligence:  Average   Abstraction:  Normal   Judgement:  Fair   Reality Testing:  Adequate   Insight:  Fair   Decision Making:  Vacilates   Social Functioning  Social Maturity:  Isolates   Social Judgement:  Victimized   Stress  Stressors:  Housing; Relationship   Coping Ability:  Exhausted   Skill Deficits:  Decision making   Supports:  Other (Comment); Family     Religion: Religion/Spirituality Are You A Religious Person?: Yes  Leisure/Recreation: Leisure / Recreation Do You Have Hobbies?: No  Exercise/Diet: Exercise/Diet Do You Exercise?: Yes Have You Gained or Lost A Significant Amount of Weight in the Past Six Months?: No Do You Follow a Special Diet?: No Do You Have Any Trouble Sleeping?: Yes   CCA Employment/Education Employment/Work Situation: Employment / Work Situation Employment situation: On disability Why is patient on disability: schizoaffective disorder How long has patient been on disability: UTA Patient's job has been impacted by current illness: No What is the longest time patient has a held a job?: UTA Where was the patient employed at that time?: UTA Has patient ever been in the [employer]?: No  Education: Education Last Grade Completed:  (not assessed) Name of [place]: not assessed Did Teacher, adult education From [employer]?: No (not assessed) Did You Attend  College?: No Did You Attend Graduate School?: No (not assessed) Did You Have Any Special Interests In School?: not assessed Did You Have An Individualized Education Program (IIEP): No Did You Have Any Difficulty At School?: No   CCA Family/Childhood History Family and Relationship History: Family history Marital status: Single Are you sexually active?: Yes What is your sexual orientation?: heterosexual Has your sexual activity been affected by drugs, alcohol, medication, or emotional stress?: Client denies.  Does patient have children?: Yes How many children?: 1 How is patient's relationship with their children?: UTA  Childhood History:  Childhood History By whom was/is the patient raised?: Mother Additional childhood history information: Patient reports "I only seen him (father) about three times in my life and now he don't want nothing to do with me." Description of patient's relationship with caregiver when they were a child: Patient reports "it was good, she raised  me good".  How were you disciplined when you got in trouble as a child/adolescent?: Patient reports "nothing". Does patient have siblings?: No Did patient suffer any verbal/emotional/physical/sexual abuse as a child?: No Did patient suffer from severe childhood neglect?: No Has patient ever been sexually abused/assaulted/raped as an adolescent or adult?: No Was the patient ever a victim of a crime or a disaster?: No Witnessed domestic violence?: No Has patient been affected by domestic violence as an adult?: No  Child/Adolescent Assessment:     CCA Substance Use Alcohol/Drug Use: Alcohol / Drug Use Pain Medications: Please see MAR Prescriptions: Please see MAR Over the Counter: Please see MAR History of alcohol / drug use?: No history of alcohol / drug abuse Longest period of sobriety (when/how long): NA Negative Consequences of Use:  (Denies) Withdrawal Symptoms: Patient aware of relationship between  substance abuse and physical/medical complications                         ASAM's:  Six Dimensions of Multidimensional Assessment  Dimension 1:  Acute Intoxication and/or Withdrawal Potential:      Dimension 2:  Biomedical Conditions and Complications:      Dimension 3:  Emotional, Behavioral, or Cognitive Conditions and Complications:     Dimension 4:  Readiness to Change:     Dimension 5:  Relapse, Continued use, or Continued Problem Potential:     Dimension 6:  Recovery/Living Environment:     ASAM Severity Score:    ASAM Recommended Level of Treatment:     Substance use Disorder (SUD)    Recommendations for Services/Supports/Treatments:    DSM5 Diagnoses: Patient Active Problem List   Diagnosis Date Noted  . Syphilis 07/15/2020  . Malingering 06/05/2020  . Gastroesophageal reflux disease 05/04/2020  . Hyperglycemia due to type 2 diabetes mellitus ([place]) 05/04/2020  . Long term (current) use of insulin ([place]) 05/04/2020  . Migraine without aura 05/04/2020  . Morbid obesity ([place]) 05/04/2020  . 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 2 diabetes mellitus ([place]) 05/04/2020  . Prolapsed internal hemorrhoids 05/04/2020  . Vitamin D deficiency 05/04/2020  . Other symptoms and signs involving cognitive functions and awareness 05/04/2020  . Suicide attempt ([place])   . Anxiety state 03/06/2020  . Schizophrenia ([place]) 09/13/2019  . Bipolar I disorder, most recent episode depressed ([place]) 06/23/2019  . MDD (major depressive disorder) 10/10/2018  . Schizoaffective disorder, bipolar type ([place]) 09/25/2018  . Affective psychosis, bipolar ([place]) 06/13/2018  . HTN (hypertension) 05/03/2018  . Tobacco use disorder 05/03/2018  . Adjustment disorder with emotional disturbance 01/02/2018  . 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 Moderate bipolar I disorder, most recent episode depressed ([place])   . Psychosis ([place])   . Adjustment disorder with mixed disturbance of emotions and conduct  08/03/2017  . 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 OCD (obsessive compulsive disorder) 10/05/2016  . Major depressive disorder, recurrent episode, mild ([place]) 05/04/2016  . 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 Learning disability 07/18/2015  . 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 Diabetes mellitus ([place]) 07/18/2015  . 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 Hyperlipidemia 07/18/2015  . Severe episode of recurrent major depressive disorder, without psychotic features ([place])   . Suicidal ideation   . Drug overdose   . Cognitive deficits 10/12/2012  . Generalized anxiety disorder 06/28/2012    Patient Centered Plan: Patient is on the following Treatment Plan(s):    Referrals to Alternative Service(s): Referred to Alternative Service(s):  Place:   Date:   Time:    Referred to Alternative Service(s):   Place:   Date:   Time:    Referred to Alternative Service(s):   Place:   Date:   Time:    Referred to Alternative Service(s):   Place:   Date:   Time:     Mamie Nick, LCAS

## 2020-08-12 NOTE — ED Provider Notes (Signed)
[facility] DEPT Provider Note   CSN: [id number] Arrival date & time: 08/12/20  1356     History Chief Complaint  Patient presents with  . Suicidal    [name] is a 30 y.o. female.  30 year old female who presents with increased agitation as well as suicidal ideations with plan to overdose on her pills.  Patient has history of bipolar disorder and is well-known to this department.  States she has been more agitated recently.  Recent stressors include being upset with her mother.  Patient is very difficult historian due to her current psychiatric crisis.        Past Medical History:  Diagnosis Date  . Anxiety   . Bipolar 1 disorder ([place])   . Cognitive deficits   . Depression   . Diabetes mellitus without complication ([place])   . Hypertension   . Mental disorder   . Mental health disorder   . Obesity     Patient Active Problem List   Diagnosis Date Noted  . Syphilis 07/15/2020  . Malingering 06/05/2020  . Gastroesophageal reflux disease 05/04/2020  . Hyperglycemia due to type 2 diabetes mellitus ([place]) 05/04/2020  . Long term (current) use of insulin ([place]) 05/04/2020  . Migraine without aura 05/04/2020  . Morbid obesity ([place]) 05/04/2020  . 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 2 diabetes mellitus ([name]) 05/04/2020  . Prolapsed internal hemorrhoids 05/04/2020  . Vitamin D deficiency 05/04/2020  . Other symptoms and signs involving cognitive functions and awareness 05/04/2020  . Suicide attempt ([place])   . Anxiety state 03/06/2020  . Schizophrenia ([place]) 09/13/2019  . Bipolar I disorder, most recent episode depressed ([place]) 06/23/2019  . MDD (major depressive disorder) 10/10/2018  . Schizoaffective disorder, bipolar type ([place]) 09/25/2018  . Affective psychosis, bipolar ([place]) 06/13/2018  . HTN (hypertension) 05/03/2018  . Tobacco use disorder 05/03/2018  . Adjustment disorder with emotional disturbance 01/02/2018  . 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 Moderate bipolar I disorder, most recent episode depressed ([place])   . Psychosis ([place])   . 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 OCD (obsessive compulsive disorder) 10/05/2016  . Major depressive disorder, recurrent episode, mild ([place]) 05/04/2016  . 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 Learning disability 07/18/2015  . 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 Diabetes mellitus ([place]) 07/18/2015  . 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 Hyperlipidemia 07/18/2015  . Severe episode of recurrent major depressive disorder, without psychotic features ([place])   . Suicidal ideation   . Drug overdose   . Cognitive deficits 10/12/2012  . Generalized anxiety disorder 06/28/2012    Past Surgical History:  Procedure Laterality Date  . CESAREAN SECTION    . CESAREAN SECTION N/A 04/25/2013   Procedure: REPEAT CESAREAN SECTION;  Surgeon: [name], MD;  Location: [facility];  Service: Obstetrics;  Laterality: N/A;  . MASS EXCISION N/A 06/03/2012   Procedure: EXCISION MASS;  Surgeon: [name], MD;  Location: [facility];  Service: ENT;  Laterality: N/A;  Excision uvula mass  . TONSILLECTOMY N/A 06/03/2012   Procedure: TONSILLECTOMY;  Surgeon: [name], MD;  Location: [facility];  Service: ENT;  Laterality: N/A;  . TONSILLECTOMY       OB History    Gravida  3   Para  3   Term  3   Preterm  0   AB  0   Living  3     SAB  0   IAB  0   Ectopic  0   Multiple      Live Births  3           Family History  Problem Relation Age of Onset  . Hypertension Mother   . Diabetes Father     Social History   Tobacco Use  . Smoking status: Current Every Day Smoker    Packs/day: 4.00    Years: 11.00    Pack years: 44.00    Types: Cigarettes, Cigars  . Smokeless tobacco: Never Used  . Tobacco  comment: Pt declined  Vaping Use  . Vaping Use: Never used  Substance Use Topics  . Alcohol use: Not Currently    Comment: occ: last intake 4 mts ago  . Drug use: Not Currently    Types: "Crack" cocaine, Other-see comments    Comment: Patient reports hx of smoking Crack    Home Medications Prior to Admission medications   Medication Sig Start Date End Date Taking? Authorizing Provider  amLODipine (NORVASC) 5 MG tablet Take 5 mg by mouth daily. Patient not taking: No sig reported    [provider]  benztropine (COGENTIN) 1 MG tablet Take 1 mg by mouth 2 (two) times daily. Patient not taking: No sig reported 05/01/20   [provider]  cholecalciferol (VITAMIN D) 25 MCG (1000 UNIT) tablet Take 1,000 Units by mouth daily. Patient not taking: Reported on 07/30/2020 04/30/20   [provider]  DULoxetine (CYMBALTA) 60 MG capsule Take 1 capsule (60 mg total) by mouth daily. Patient not taking: No sig reported 05/31/20   Money, Darnelle Maffucci B, FNP  glipiZIDE (GLUCOTROL XL) 10 MG 24 hr tablet Take 10 mg by mouth once. Patient not taking: No sig reported    [provider]  haloperidol (HALDOL) 10 MG tablet Take 10 mg by mouth at bedtime. Patient not taking: No sig reported 05/09/20   [provider]  haloperidol decanoate (HALDOL DECANOATE) 50 MG/ML injection Inject 50 mg into the muscle every 28 (twenty-eight) days. Patient not taking: No sig reported 03/19/20   [provider]  hydrOXYzine (ATARAX/VISTARIL) 25 MG tablet Take 1 tablet (25 mg total) by mouth 3 (three) times daily as needed for anxiety. Patient not taking: No sig reported 05/27/20   Derrill Center, NP  insulin detemir (LEVEMIR) 100 UNIT/ML injection Inject 0.4 mLs (40 Units total) into the skin 2 (two) times daily. Patient not taking: No sig reported 05/05/20   Montine Circle, PA-C  omeprazole (PRILOSEC) 20 MG capsule Take 20 mg by mouth daily. Patient not taking: No sig reported 04/30/20    [provider]  propranolol (INDERAL) 10 MG tablet Take 1 tablet (10 mg total) by mouth 2 (two) times daily. Patient not taking: Reported on 07/23/2020 05/05/20 06/04/20  Montine Circle, PA-C  simvastatin (ZOCOR) 20 MG tablet Take 20 mg by mouth at bedtime. Patient not taking: No sig reported 08/21/19   [provider]  vitamin B-12 (CYANOCOBALAMIN) 1000 MCG tablet Take 1,000 mcg by mouth daily. Patient not taking: No sig reported 11/13/19   [provider]    Allergies    Wellbutrin [bupropion], Omnipaque [iohexol], Penicillins, Cogentin [benztropine], Depakote er [divalproex sodium er], and Penicillin g  Review of Systems   Review of Systems  Unable to perform ROS: Psychiatric disorder    Physical Exam Updated Vital Signs BP 125/78   Pulse 95   Temp 98.4 F (  36.9 C) (Oral)   Resp 20   SpO2 100%   Physical Exam Vitals and nursing note reviewed.  Constitutional:      General: She is not in acute distress.    Appearance: Normal appearance. She is well-developed. She is not toxic-appearing.  HENT:     Head: Normocephalic and atraumatic.  Eyes:     General: Lids are normal.     Conjunctiva/sclera: Conjunctivae normal.     Pupils: Pupils are equal, round, and reactive to light.  Neck:     Thyroid: No thyroid mass.     Trachea: No tracheal deviation.  Cardiovascular:     Rate and Rhythm: Normal rate and regular rhythm.     Heart sounds: Normal heart sounds. No murmur heard. No gallop.   Pulmonary:     Effort: Pulmonary effort is normal. No respiratory distress.     Breath sounds: Normal breath sounds. No stridor. No decreased breath sounds, wheezing, rhonchi or rales.  Abdominal:     General: Bowel sounds are normal. There is no distension.     Palpations: Abdomen is soft.     Tenderness: There is no abdominal tenderness. There is no rebound.  Musculoskeletal:        General: No tenderness. Normal range of motion.     Cervical back: Normal  range of motion and neck supple.  Skin:    General: Skin is warm and dry.     Findings: No abrasion or rash.  Neurological:     Mental Status: She is alert and oriented to person, place, and time.     GCS: GCS eye subscore is 4. GCS verbal subscore is 5. GCS motor subscore is 6.     Cranial Nerves: No cranial nerve deficit.     Sensory: No sensory deficit.  Psychiatric:        Attention and Perception: She is inattentive.        Mood and Affect: Affect is labile, angry and inappropriate.        Speech: Speech is rapid and pressured.        Behavior: Behavior is agitated, aggressive, hyperactive and combative.        Thought Content: Thought content includes suicidal ideation. Thought content includes suicidal plan.     ED Results / Procedures / Treatments   Labs (all labs ordered are listed, but only abnormal results are displayed) Labs Reviewed  RESP PANEL BY RT-PCR (FLU A&B, COVID) ARPGX2  RAPID URINE DRUG SCREEN, HOSP PERFORMED  ETHANOL  CBC WITH DIFFERENTIAL/PLATELET  COMPREHENSIVE METABOLIC PANEL  I-STAT BETA HCG BLOOD, ED ([facility], [facility], [facility] ONLY)    EKG None  Radiology No results found.  Procedures Procedures   Medications Ordered in ED Medications  glipiZIDE (GLUCOTROL XL) 24 hr tablet 10 mg (has no administration in time range)  DULoxetine (CYMBALTA) DR capsule 60 mg (has no administration in time range)  benztropine (COGENTIN) tablet 1 mg (has no administration in time range)  amLODipine (NORVASC) tablet 5 mg (has no administration in time range)  haloperidol (HALDOL) tablet 10 mg (has no administration in time range)  insulin detemir (LEVEMIR) injection 40 Units (has no administration in time range)  propranolol (INDERAL) tablet 10 mg (has no administration in time range)  simvastatin (ZOCOR) tablet 20 mg (has no administration in time range)  haloperidol lactate (HALDOL) injection 5 mg (5 mg Intramuscular Given 08/12/20 1422)  LORazepam (ATIVAN) injection 2 mg (2  mg Intramuscular Given 08/12/20 1423)  ED Course  I have reviewed the triage vital signs and the nursing notes.  Pertinent labs & imaging results that were available during my care of the patient were reviewed by me and considered in my medical decision making (see chart for details).    MDM Rules/Calculators/A&P                          Patient given Ativan and Haldol here.  Will consult behavioral health.  Medical clearance labs performed Final Clinical Impression(s) / ED Diagnoses Final diagnoses:  None    Rx / DC Orders ED Discharge Orders    None       Lacretia Leigh, MD 08/12/20 1427

## 2020-08-12 NOTE — ED Triage Notes (Signed)
Pt also states she is feeling suicidal with a plan to overdose on her medication.

## 2020-08-12 NOTE — ED Provider Notes (Addendum)
Behavioral Health Admission H&P [facility] & OBS)  Date: 08/12/20 Patient Name: Windsor Jon MRN: [id number] Chief Complaint:  Chief Complaint  Patient presents with  . Suicidal      Diagnoses:  Final diagnoses:  Schizoaffective disorder, bipolar type ([place])  Suicidal ideation    HPI: Skarlet Lyons is a 30 y.o. female with a history of schizoaffective disorder who presented to [facility] due to [place]. She is well known to behavioral health services. She was recommended for continuous assessment by [name], NP and transferred to [facility].  On evaluation this evening, the patient is alert and oriented x 4. She is pleasant and cooperative. Speech is clear and coherent. She states that she went to the ED because "I have been hanging out with the wrong people and it has made me suicidal." She would not elaborate on what she means by "the wrong people." She continues to report suicidal ideation with thoughts of overdosing on her medication.  She reports homicidal ideations without any intent or plan. She denies HI towards anyone specific. She denies auditory and visual hallucinations. No indication that she is responding to internal stimuli. She denies substance abuse. UDS positive for barbiturates only. Patient states that she is followed by an ACT Team. Denies any recent medication changes.   PHQ 2-9:  Flowsheet Row Clinical Support from 07/05/2020 in [facility] ED from 05/26/2020 in [place] DEPT ED from 05/22/2020 in [facility]  Thoughts that you would be better off dead, or of hurting yourself in some way Not at all More than half the days Nearly every day  PHQ-9 Total Score 4 18 19       Flowsheet Row ED from 08/12/2020 in [facility] Most recent reading at 08/12/2020  9:17 PM ED from 08/12/2020 in [place] DEPT Most recent reading  at 08/12/2020  4:49 PM ED from 07/29/2020 in [place] DEPT Most recent reading at 07/29/2020  9:17 PM  C-SSRS RISK CATEGORY High Risk High Risk High Risk       Total Time spent with patient: 15 minutes  Musculoskeletal  Strength & Muscle Tone: within normal limits Gait & Station: normal Patient leans: N/A  Psychiatric Specialty Exam  Presentation General Appearance: Appropriate for Environment; Casual  Eye Contact:Fair  Speech:Clear and Coherent; Normal Rate  Speech Volume:Normal  Handedness:Right   Mood and Affect  Mood:Anxious; Worthless  Affect:Blunt   Social worker Processes:Coherent  Descriptions of Associations:Intact  Orientation:Full (Time, Place and Person)  Thought Content:Logical  Diagnosis of Schizophrenia or Schizoaffective disorder in past: Yes   Hallucinations:Hallucinations: None  Ideas of Reference:None  Suicidal Thoughts:Suicidal Thoughts: Yes, Active SI Active Intent and/or Plan: With Intent; With Plan; With Means to Carry Out  Homicidal Thoughts:Homicidal Thoughts: Yes, Passive HI Passive Intent and/or Plan: Without Intent; Without Plan   Sensorium  Memory:Immediate Good; Recent Good; Remote Fair  Judgment:Impaired  Insight:Fair   Executive Functions  Concentration:Fair  Attention Span:Fair  [place]   Psychomotor Activity  Psychomotor Activity:Psychomotor Activity: Normal   Assets  Assets:Communication Skills; Desire for Improvement; Housing; Financial Resources/Insurance   Sleep  Sleep:Sleep: Fair   Nutritional Assessment (For OBS and FBC admissions only) Has the patient had a weight loss or gain of 10 pounds or more in the last 3 months?: No Has the patient had a decrease in food intake/or appetite?:  No Does the patient have dental problems?: No Does the patient have eating habits or behaviors that may be indicators of an  eating disorder including binging or inducing vomiting?: No Has the patient recently lost weight without trying?: No Has the patient been eating poorly because of a decreased appetite?: No Malnutrition Screening Tool Score: 0    Physical Exam Constitutional:      General: She is not in acute distress.    Appearance: She is not ill-appearing, toxic-appearing or diaphoretic.  HENT:     Head: Normocephalic.     Right Ear: External ear normal.     Left Ear: External ear normal.  Cardiovascular:     Rate and Rhythm: Normal rate.  Pulmonary:     Effort: Pulmonary effort is normal. No respiratory distress.  Musculoskeletal:        General: Normal range of motion.  Skin:    General: Skin is warm and dry.  Neurological:     Mental Status: She is alert and oriented to person, place, and time.  Psychiatric:        Mood and Affect: Mood is anxious and depressed. Affect is blunt.        Thought Content: Thought content is not paranoid or delusional. Thought content includes homicidal and suicidal ideation. Thought content includes suicidal plan. Thought content does not include homicidal plan.    Review of Systems  Constitutional: Negative for chills, diaphoresis, fever, malaise/fatigue and weight loss.  HENT: Negative for congestion.   Respiratory: Negative for cough and shortness of breath.   Cardiovascular: Negative for chest pain and palpitations.  Gastrointestinal: Negative for diarrhea, nausea and vomiting.  Neurological: Negative for dizziness and seizures.  Psychiatric/Behavioral: Positive for depression, hallucinations and suicidal ideas. Negative for memory loss. The patient is nervous/anxious and has insomnia.   All other systems reviewed and are negative.   Blood pressure 131/88, pulse 86, temperature 98.8 F (37.1 C), temperature source Oral, resp. rate 18, SpO2 100 %. There is no height or weight on file to calculate BMI.  Past Psychiatric History: Schizoaffective  Disorder, Bipolar Disorder, last inpatient at [facility] 05/2019. Multiple visits to [facility].   Is the patient at risk to self? Yes  Has the patient been a risk to self in the past 6 months? Yes .    Has the patient been a risk to self within the distant past? Yes   Is the patient a risk to others? No   Has the patient been a risk to others in the past 6 months? No   Has the patient been a risk to others within the distant past? No   Past Medical History:  Past Medical History:  Diagnosis Date  . Anxiety   . Bipolar 1 disorder ([place])   . Cognitive deficits   . Depression   . Diabetes mellitus without complication ([place])   . Hypertension   . Mental disorder   . Mental health disorder   . Obesity     Past Surgical History:  Procedure Laterality Date  . CESAREAN SECTION    . CESAREAN SECTION N/A 04/25/2013   Procedure: REPEAT CESAREAN SECTION;  Surgeon: [name], MD;  Location: [facility];  Service: Obstetrics;  Laterality: N/A;  . MASS EXCISION N/A 06/03/2012   Procedure: EXCISION MASS;  Surgeon: [name], MD;  Location: [facility];  Service: ENT;  Laterality: N/A;  Excision uvula mass  . TONSILLECTOMY N/A 06/03/2012   Procedure: TONSILLECTOMY;  Surgeon: [name],  MD;  Location: [place];  Service: ENT;  Laterality: N/A;  . TONSILLECTOMY      Family History:  Family History  Problem Relation Age of Onset  . Hypertension Mother   . Diabetes Father     Social History:  Social History   Socioeconomic History  . Marital status: Single    Spouse name: Not on file  . Number of children: Not on file  . Years of education: Not on file  . Highest education level: Not on file  Occupational History  . Not on file  Tobacco Use  . Smoking status: Current Every Day Smoker    Packs/day: 4.00    Years: 11.00    Pack years: 44.00    Types: Cigarettes, Cigars  . Smokeless tobacco: Never Used  . Tobacco comment: Pt declined  Vaping Use  . Vaping  Use: Never used  Substance and Sexual Activity  . Alcohol use: Not Currently    Comment: occ: last intake 4 mts ago  . Drug use: Not Currently    Types: "Crack" cocaine, Other-see comments    Comment: Patient reports hx of smoking Crack  . Sexual activity: Yes    Birth control/protection: Implant  Other Topics Concern  . Not on file  Social History Narrative   ** Merged History Encounter **       Social Determinants of Health   Financial Resource Strain: Not on file  Food Insecurity: Not on file  Transportation Needs: Not on file  Physical Activity: Not on file  Stress: Not on file  Social Connections: Not on file  Intimate Partner Violence: Not on file    SDOH:  SDOH Screenings   Alcohol Screen: Low Risk   . Last Alcohol Screening Score (AUDIT): 3  Depression (PHQ2-9): Low Risk   . PHQ-2 Score: 4  Financial Resource Strain: Not on file  Food Insecurity: Not on file  Housing: Not on file  Physical Activity: Not on file  Social Connections: Not on file  Stress: Not on file  Tobacco Use: High Risk  . Smoking Tobacco Use: Current Every Day Smoker  . Smokeless Tobacco Use: Never Used  Transportation Needs: Not on file    Last Labs:  Admission on 08/12/2020  Component Date Value Ref Range Status  . Glucose-Capillary 08/12/2020 175* 70 - 99 mg/dL Final   Glucose reference range applies only to samples taken after fasting for at least 8 hours.  Admission on 08/12/2020, Discharged on 08/12/2020  Component Date Value Ref Range Status  . Opiates 08/12/2020 NONE DETECTED  NONE DETECTED Final  . Cocaine 08/12/2020 NONE DETECTED  NONE DETECTED Final  . Benzodiazepines 08/12/2020 NONE DETECTED  NONE DETECTED Final  . Amphetamines 08/12/2020 NONE DETECTED  NONE DETECTED Final  . Tetrahydrocannabinol 08/12/2020 NONE DETECTED  NONE DETECTED Final  . Barbiturates 08/12/2020 POSITIVE* NONE DETECTED Final   Comment: (NOTE) DRUG SCREEN FOR MEDICAL PURPOSES ONLY.  IF  CONFIRMATION IS NEEDED FOR ANY PURPOSE, NOTIFY LAB WITHIN 5 DAYS.  LOWEST DETECTABLE LIMITS FOR URINE DRUG SCREEN Drug Class                     Cutoff (ng/mL) Amphetamine and metabolites    1000 Barbiturate and metabolites    200 Benzodiazepine                 010 Tricyclics and metabolites     300 Opiates and metabolites  300 Cocaine and metabolites        300 THC                            50 Performed at [facility], [place] [address] [place], [place] [address]   . Alcohol, Ethyl (B) 08/12/2020 <10  <10 mg/dL Final   Comment: (NOTE) Lowest detectable limit for serum alcohol is 10 mg/dL.  For medical purposes only. Performed at [facility], [place] [address] [place], [place] [address]   . WBC 08/12/2020 10.1  4.0 - 10.5 K/uL Final  . RBC 08/12/2020 4.92  3.87 - 5.11 MIL/uL Final  . Hemoglobin 08/12/2020 11.8* 12.0 - 15.0 g/dL Final  . HCT 08/12/2020 38.4  36.0 - 46.0 % Final  . MCV 08/12/2020 78.0* 80.0 - 100.0 fL Final  . MCH 08/12/2020 24.0* 26.0 - 34.0 pg Final  . MCHC 08/12/2020 30.7  30.0 - 36.0 g/dL Final  . RDW 08/12/2020 15.7* 11.5 - 15.5 % Final  . Platelets 08/12/2020 225  150 - 400 K/uL Final  . nRBC 08/12/2020 0.0  0.0 - 0.2 % Final  . Neutrophils Relative % 08/12/2020 56  % Final  . Neutro Abs 08/12/2020 5.6  1.7 - 7.7 K/uL Final  . Lymphocytes Relative 08/12/2020 35  % Final  . Lymphs Abs 08/12/2020 3.6  0.7 - 4.0 K/uL Final  . Monocytes Relative 08/12/2020 4  % Final  . Monocytes Absolute 08/12/2020 0.4  0.1 - 1.0 K/uL Final  . Eosinophils Relative 08/12/2020 4  % Final  . Eosinophils Absolute 08/12/2020 0.4  0.0 - 0.5 K/uL Final  . Basophils Relative 08/12/2020 1  % Final  . Basophils Absolute 08/12/2020 0.1  0.0 - 0.1 K/uL Final  . Immature Granulocytes 08/12/2020 0  % Final  . Abs Immature Granulocytes 08/12/2020 0.03  0.00 - 0.07 K/uL Final   Performed at [facility], [place] [address] [place], [place] [address]  . Sodium 08/12/2020 141  135 - 145 mmol/L Final  . Potassium 08/12/2020 3.8  3.5 - 5.1 mmol/L Final  . Chloride 08/12/2020 108  98 - 111 mmol/L Final  . CO2 08/12/2020 25  22 - 32 mmol/L Final  . Glucose, Bld 08/12/2020 156* 70 - 99 mg/dL Final   Glucose reference range applies only to samples taken after fasting for at least 8 hours.  . BUN 08/12/2020 11  6 - 20 mg/dL Final  . Creatinine, Ser 08/12/2020 0.79  0.44 - 1.00 mg/dL Final  . Calcium 08/12/2020 9.4  8.9 - 10.3 mg/dL Final  . Total Protein 08/12/2020 7.9  6.5 - 8.1 g/dL Final  . Albumin 08/12/2020 4.2  3.5 - 5.0 g/dL Final  . AST 08/12/2020 25  15 - 41 U/L Final  . ALT 08/12/2020 19  0 - 44 U/L Final  . Alkaline Phosphatase 08/12/2020 52  38 - 126 U/L Final  . Total Bilirubin 08/12/2020 0.6  0.3 - 1.2 mg/dL Final  . GFR, Estimated 08/12/2020 >60  >60 mL/min Final   Comment: (NOTE) Calculated using the CKD-EPI Creatinine Equation (2021)   . Anion gap 08/12/2020 8  5 - 15 Final   Performed at [facility], [place] [address] [place],  [address]  . I-stat hCG, quantitative 08/12/2020 <5.0  <5 mIU/mL Final  . Comment 3 08/12/2020          Final   Comment:  GEST. AGE      CONC.  (mIU/mL)   <=1 WEEK        5 - 50     2 WEEKS       50 - 500     3 WEEKS       100 - 10,000     4 WEEKS     1,000 - 30,000        FEMALE AND NON-PREGNANT FEMALE:     LESS THAN 5 mIU/mL   . SARS Coronavirus 2 by RT PCR 08/12/2020 NEGATIVE  NEGATIVE Final   Comment: (NOTE) SARS-CoV-2 target nucleic acids are NOT DETECTED.  The SARS-CoV-2 RNA is generally detectable in upper respiratory specimens during the acute phase of infection. The lowest concentration of SARS-CoV-2 viral copies this assay can detect is 138 copies/mL. A negative result does not preclude SARS-Cov-2 infection and should not be used as the sole basis for treatment or other patient management decisions. A negative result may occur  with  improper specimen collection/handling, submission of specimen other than nasopharyngeal swab, presence of viral mutation(s) within the areas targeted by this assay, and inadequate number of viral copies(<138 copies/mL). A negative result must be combined with clinical observations, patient history, and epidemiological information. The expected result is Negative.  Fact Sheet for Patients:  EntrepreneurPulse.com.au  Fact Sheet for Healthcare Providers:  IncredibleEmployment.be  This test is no                          t yet approved or cleared by the [place] FDA and  has been authorized for detection and/or diagnosis of SARS-CoV-2 by FDA under an Emergency Use Authorization (EUA). This EUA will remain  in effect (meaning this test can be used) for the duration of the COVID-19 declaration under Section 564(b)(1) of the Act, 21 U.S.C.section 360bbb-3(b)(1), unless the authorization is terminated  or revoked sooner.      . Influenza A by PCR 08/12/2020 NEGATIVE  NEGATIVE Final  . Influenza B by PCR 08/12/2020 NEGATIVE  NEGATIVE Final   Comment: (NOTE) The Xpert Xpress SARS-CoV-2/FLU/RSV plus assay is intended as an aid in the diagnosis of influenza from Nasopharyngeal swab specimens and should not be used as a sole basis for treatment. Nasal washings and aspirates are unacceptable for Xpert Xpress SARS-CoV-2/FLU/RSV testing.  Fact Sheet for Patients: EntrepreneurPulse.com.au  Fact Sheet for Healthcare Providers: IncredibleEmployment.be  This test is not yet approved or cleared by the [place] FDA and has been authorized for detection and/or diagnosis of SARS-CoV-2 by FDA under an Emergency Use Authorization (EUA). This EUA will remain in effect (meaning this test can be used) for the duration of the COVID-19 declaration under Section 564(b)(1) of the Act, 21 U.S.C. section 360bbb-3(b)(1),  unless the authorization is terminated or revoked.  Performed at [facility], [place] [address], [place], [place] [address]   . Glucose-Capillary 08/12/2020 157* 70 - 99 mg/dL Final   Glucose reference range applies only to samples taken after fasting for at least 8 hours.  Admission on 07/29/2020, Discharged on 07/30/2020  Component Date Value Ref Range Status  . SARS Coronavirus 2 by RT PCR 07/29/2020 NEGATIVE  NEGATIVE Final   Comment: (NOTE) SARS-CoV-2 target nucleic acids are NOT DETECTED.  The SARS-CoV-2 RNA is generally detectable in upper respiratory specimens during the acute phase of infection. The lowest concentration of SARS-CoV-2 viral copies this assay can detect is 138 copies/mL. A negative  result does not preclude SARS-Cov-2 infection and should not be used as the sole basis for treatment or other patient management decisions. A negative result may occur with  improper specimen collection/handling, submission of specimen other than nasopharyngeal swab, presence of viral mutation(s) within the areas targeted by this assay, and inadequate number of viral copies(<138 copies/mL). A negative result must be combined with clinical observations, patient history, and epidemiological information. The expected result is Negative.  Fact Sheet for Patients:  EntrepreneurPulse.com.au  Fact Sheet for Healthcare Providers:  IncredibleEmployment.be  This test is no                          t yet approved or cleared by the [place] FDA and  has been authorized for detection and/or diagnosis of SARS-CoV-2 by FDA under an Emergency Use Authorization (EUA). This EUA will remain  in effect (meaning this test can be used) for the duration of the COVID-19 declaration under Section 564(b)(1) of the Act, 21 U.S.C.section 360bbb-3(b)(1), unless the authorization is terminated  or revoked sooner.      . Influenza A by PCR  07/29/2020 NEGATIVE  NEGATIVE Final  . Influenza B by PCR 07/29/2020 NEGATIVE  NEGATIVE Final   Comment: (NOTE) The Xpert Xpress SARS-CoV-2/FLU/RSV plus assay is intended as an aid in the diagnosis of influenza from Nasopharyngeal swab specimens and should not be used as a sole basis for treatment. Nasal washings and aspirates are unacceptable for Xpert Xpress SARS-CoV-2/FLU/RSV testing.  Fact Sheet for Patients: EntrepreneurPulse.com.au  Fact Sheet for Healthcare Providers: IncredibleEmployment.be  This test is not yet approved or cleared by the [place] FDA and has been authorized for detection and/or diagnosis of SARS-CoV-2 by FDA under an Emergency Use Authorization (EUA). This EUA will remain in effect (meaning this test can be used) for the duration of the COVID-19 declaration under Section 564(b)(1) of the Act, 21 U.S.C. section 360bbb-3(b)(1), unless the authorization is terminated or revoked.  Performed at [facility], [place] [address] [place], [place] [address]   . Sodium 07/29/2020 140  135 - 145 mmol/L Final  . Potassium 07/29/2020 3.9  3.5 - 5.1 mmol/L Final  . Chloride 07/29/2020 108  98 - 111 mmol/L Final  . CO2 07/29/2020 26  22 - 32 mmol/L Final  . Glucose, Bld 07/29/2020 167* 70 - 99 mg/dL Final   Glucose reference range applies only to samples taken after fasting for at least 8 hours.  . BUN 07/29/2020 10  6 - 20 mg/dL Final  . Creatinine, Ser 07/29/2020 0.79  0.44 - 1.00 mg/dL Final  . Calcium 07/29/2020 9.2  8.9 - 10.3 mg/dL Final  . Total Protein 07/29/2020 7.2  6.5 - 8.1 g/dL Final  . Albumin 07/29/2020 4.0  3.5 - 5.0 g/dL Final  . AST 07/29/2020 23  15 - 41 U/L Final  . ALT 07/29/2020 19  0 - 44 U/L Final  . Alkaline Phosphatase 07/29/2020 51  38 - 126 U/L Final  . Total Bilirubin 07/29/2020 0.5  0.3 - 1.2 mg/dL Final  . GFR, Estimated 07/29/2020 >60  >60 mL/min Final   Comment:  (NOTE) Calculated using the CKD-EPI Creatinine Equation (2021)   . Anion gap 07/29/2020 6  5 - 15 Final   Performed at [facility], [place] [address], [place], [place] [address]  . Alcohol, Ethyl (B) 07/29/2020 <10  <10 mg/dL Final   Comment: (NOTE) Lowest detectable limit for  serum alcohol is 10 mg/dL.  For medical purposes only. Performed at [facility], [place] [address], [place], [place] [address]   . Opiates 07/29/2020 NONE DETECTED  NONE DETECTED Final  . Cocaine 07/29/2020 NONE DETECTED  NONE DETECTED Final  . Benzodiazepines 07/29/2020 NONE DETECTED  NONE DETECTED Final  . Amphetamines 07/29/2020 NONE DETECTED  NONE DETECTED Final  . Tetrahydrocannabinol 07/29/2020 NONE DETECTED  NONE DETECTED Final  . Barbiturates 07/29/2020 POSITIVE* NONE DETECTED Final   Comment: (NOTE) DRUG SCREEN FOR MEDICAL PURPOSES ONLY.  IF CONFIRMATION IS NEEDED FOR ANY PURPOSE, NOTIFY LAB WITHIN 5 DAYS.  LOWEST DETECTABLE LIMITS FOR URINE DRUG SCREEN Drug Class                     Cutoff (ng/mL) Amphetamine and metabolites    1000 Barbiturate and metabolites    200 Benzodiazepine                 440 Tricyclics and metabolites     300 Opiates and metabolites        300 Cocaine and metabolites        300 THC                            50 Performed at [facility], [place] [address] [place], [place] [address]   . WBC 07/29/2020 8.7  4.0 - 10.5 K/uL Final  . RBC 07/29/2020 4.31  3.87 - 5.11 MIL/uL Final  . Hemoglobin 07/29/2020 10.5* 12.0 - 15.0 g/dL Final  . HCT 07/29/2020 33.6* 36.0 - 46.0 % Final  . MCV 07/29/2020 78.0* 80.0 - 100.0 fL Final  . MCH 07/29/2020 24.4* 26.0 - 34.0 pg Final  . MCHC 07/29/2020 31.3  30.0 - 36.0 g/dL Final  . RDW 07/29/2020 15.0  11.5 - 15.5 % Final  . Platelets 07/29/2020 260  150 - 400 K/uL Final  . nRBC 07/29/2020 0.0  0.0 - 0.2 % Final  . Neutrophils Relative % 07/29/2020 45  % Final  . Neutro Abs  07/29/2020 3.9  1.7 - 7.7 K/uL Final  . Lymphocytes Relative 07/29/2020 44  % Final  . Lymphs Abs 07/29/2020 3.8  0.7 - 4.0 K/uL Final  . Monocytes Relative 07/29/2020 6  % Final  . Monocytes Absolute 07/29/2020 0.5  0.1 - 1.0 K/uL Final  . Eosinophils Relative 07/29/2020 4  % Final  . Eosinophils Absolute 07/29/2020 0.3  0.0 - 0.5 K/uL Final  . Basophils Relative 07/29/2020 1  % Final  . Basophils Absolute 07/29/2020 0.1  0.0 - 0.1 K/uL Final  . Immature Granulocytes 07/29/2020 0  % Final  . Abs Immature Granulocytes 07/29/2020 0.02  0.00 - 0.07 K/uL Final   Performed at [facility], [place] [address], [place],  [address]  . I-stat hCG, quantitative 07/29/2020 <5.0  <5 mIU/mL Final  . Comment 3 07/29/2020          Final   Comment:   GEST. AGE      CONC.  (mIU/mL)   <=1 WEEK        5 - 50     2 WEEKS       50 - 500     3 WEEKS       100 - 10,000     4 WEEKS     1,000 - 30,000        FEMALE AND NON-PREGNANT  FEMALE:     LESS THAN 5 mIU/mL   Admission on 07/23/2020, Discharged on 07/24/2020  Component Date Value Ref Range Status  . Glucose-Capillary 07/23/2020 100* 70 - 99 mg/dL Final   Glucose reference range applies only to samples taken after fasting for at least 8 hours.  [place] Kitchen SARS Coronavirus 2 by RT PCR 07/23/2020 NEGATIVE  NEGATIVE Final   Comment: (NOTE) SARS-CoV-2 target nucleic acids are NOT DETECTED.  The SARS-CoV-2 RNA is generally detectable in upper respiratory specimens during the acute phase of infection. The lowest concentration of SARS-CoV-2 viral copies this assay can detect is 138 copies/mL. A negative result does not preclude SARS-Cov-2 infection and should not be used as the sole basis for treatment or other patient management decisions. A negative result may occur with  improper specimen collection/handling, submission of specimen other than nasopharyngeal swab, presence of viral mutation(s) within the areas targeted by this assay, and  inadequate number of viral copies(<138 copies/mL). A negative result must be combined with clinical observations, patient history, and epidemiological information. The expected result is Negative.  Fact Sheet for Patients:  EntrepreneurPulse.com.au  Fact Sheet for Healthcare Providers:  IncredibleEmployment.be  This test is no                          t yet approved or cleared by the [place] FDA and  has been authorized for detection and/or diagnosis of SARS-CoV-2 by FDA under an Emergency Use Authorization (EUA). This EUA will remain  in effect (meaning this test can be used) for the duration of the COVID-19 declaration under Section 564(b)(1) of the Act, 21 U.S.C.section 360bbb-3(b)(1), unless the authorization is terminated  or revoked sooner.      . Influenza A by PCR 07/23/2020 NEGATIVE  NEGATIVE Final  . Influenza B by PCR 07/23/2020 NEGATIVE  NEGATIVE Final   Comment: (NOTE) The Xpert Xpress SARS-CoV-2/FLU/RSV plus assay is intended as an aid in the diagnosis of influenza from Nasopharyngeal swab specimens and should not be used as a sole basis for treatment. Nasal washings and aspirates are unacceptable for Xpert Xpress SARS-CoV-2/FLU/RSV testing.  Fact Sheet for Patients: EntrepreneurPulse.com.au  Fact Sheet for Healthcare Providers: IncredibleEmployment.be  This test is not yet approved or cleared by the [place] FDA and has been authorized for detection and/or diagnosis of SARS-CoV-2 by FDA under an Emergency Use Authorization (EUA). This EUA will remain in effect (meaning this test can be used) for the duration of the COVID-19 declaration under Section 564(b)(1) of the Act, 21 U.S.C. section 360bbb-3(b)(1), unless the authorization is terminated or revoked.  Performed at [facility], [place] [address] [place], [place] [address]   . Sodium 07/23/2020 139  135 -  145 mmol/L Final  . Potassium 07/23/2020 3.9  3.5 - 5.1 mmol/L Final  . Chloride 07/23/2020 107  98 - 111 mmol/L Final  . CO2 07/23/2020 24  22 - 32 mmol/L Final  . Glucose, Bld 07/23/2020 88  70 - 99 mg/dL Final   Glucose reference range applies only to samples taken after fasting for at least 8 hours.  . BUN 07/23/2020 9  6 - 20 mg/dL Final  . Creatinine, Ser 07/23/2020 0.81  0.44 - 1.00 mg/dL Final  . Calcium 07/23/2020 9.2  8.9 - 10.3 mg/dL Final  . Total Protein 07/23/2020 7.6  6.5 - 8.1 g/dL Final  . Albumin 07/23/2020 3.9  3.5 - 5.0 g/dL Final  . AST 07/23/2020  19  15 - 41 U/L Final  . ALT 07/23/2020 16  0 - 44 U/L Final  . Alkaline Phosphatase 07/23/2020 50  38 - 126 U/L Final  . Total Bilirubin 07/23/2020 0.2* 0.3 - 1.2 mg/dL Final  . GFR, Estimated 07/23/2020 >60  >60 mL/min Final   Comment: (NOTE) Calculated using the CKD-EPI Creatinine Equation (2021)   . Anion gap 07/23/2020 8  5 - 15 Final   Performed at [facility], [place] [address] [place], [place] [address]  . Alcohol, Ethyl (B) 07/23/2020 <10  <10 mg/dL Final   Comment: (NOTE) Lowest detectable limit for serum alcohol is 10 mg/dL.  For medical purposes only. Performed at [facility], [place] [address] [place], [place] [address]   . Opiates 07/23/2020 NONE DETECTED  NONE DETECTED Final  . Cocaine 07/23/2020 NONE DETECTED  NONE DETECTED Final  . Benzodiazepines 07/23/2020 NONE DETECTED  NONE DETECTED Final  . Amphetamines 07/23/2020 NONE DETECTED  NONE DETECTED Final  . Tetrahydrocannabinol 07/23/2020 NONE DETECTED  NONE DETECTED Final  . Barbiturates 07/23/2020 POSITIVE* NONE DETECTED Final   Comment: (NOTE) DRUG SCREEN FOR MEDICAL PURPOSES ONLY.  IF CONFIRMATION IS NEEDED FOR ANY PURPOSE, NOTIFY LAB WITHIN 5 DAYS.  LOWEST DETECTABLE LIMITS FOR URINE DRUG SCREEN Drug Class                     Cutoff (ng/mL) Amphetamine and metabolites    1000 Barbiturate and  metabolites    200 Benzodiazepine                 440 Tricyclics and metabolites     300 Opiates and metabolites        300 Cocaine and metabolites        300 THC                            50 Performed at [facility], [place] [address], [place], [place] [address]   . WBC 07/23/2020 11.0* 4.0 - 10.5 K/uL Final  . RBC 07/23/2020 4.72  3.87 - 5.11 MIL/uL Final  . Hemoglobin 07/23/2020 11.5* 12.0 - 15.0 g/dL Final  . HCT 07/23/2020 36.7  36.0 - 46.0 % Final  . MCV 07/23/2020 77.8* 80.0 - 100.0 fL Final  . MCH 07/23/2020 24.4* 26.0 - 34.0 pg Final  . MCHC 07/23/2020 31.3  30.0 - 36.0 g/dL Final  . RDW 07/23/2020 14.9  11.5 - 15.5 % Final  . Platelets 07/23/2020 282  150 - 400 K/uL Final  . nRBC 07/23/2020 0.0  0.0 - 0.2 % Final  . Neutrophils Relative % 07/23/2020 61  % Final  . Neutro Abs 07/23/2020 6.7  1.7 - 7.7 K/uL Final  . Lymphocytes Relative 07/23/2020 29  % Final  . Lymphs Abs 07/23/2020 3.2  0.7 - 4.0 K/uL Final  . Monocytes Relative 07/23/2020 5  % Final  . Monocytes Absolute 07/23/2020 0.6  0.1 - 1.0 K/uL Final  . Eosinophils Relative 07/23/2020 4  % Final  . Eosinophils Absolute 07/23/2020 0.4  0.0 - 0.5 K/uL Final  . Basophils Relative 07/23/2020 1  % Final  . Basophils Absolute 07/23/2020 0.1  0.0 - 0.1 K/uL Final  . Immature Granulocytes 07/23/2020 0  % Final  . Abs Immature Granulocytes 07/23/2020 0.03  0.00 - 0.07 K/uL Final   Performed at [facility], [place] [address] [place],  [address]  .  I-stat hCG, quantitative 07/23/2020 <5.0  <5 mIU/mL Final  . Comment 3 07/23/2020          Final   Comment:   GEST. AGE      CONC.  (mIU/mL)   <=1 WEEK        5 - 50     2 WEEKS       50 - 500     3 WEEKS       100 - 10,000     4 WEEKS     1,000 - 30,000        FEMALE AND NON-PREGNANT FEMALE:     LESS THAN 5 mIU/mL   . Lithium Lvl 07/23/2020 0.42* 0.60 - 1.20 mmol/L Final   Performed at [place]  [address], [place], [place] [address]  . Glucose-Capillary 07/24/2020 143* 70 - 99 mg/dL Final   Glucose reference range applies only to samples taken after fasting for at least 8 hours.  Clinical Support on 07/19/2020  Component Date Value Ref Range Status  . Candida Vaginitis 07/19/2020 Positive*  Final  . Candida Glabrata 07/19/2020 Negative   Final  . Comment 07/19/2020 Normal Reference Range Candida Species - Negative   Final  . Comment 07/19/2020 Normal Reference Range Candida Galbrata - Negative   Final  Clinical Support on 07/05/2020  Component Date Value Ref Range Status  . HIV Screen 4th Generation wRfx 07/05/2020 Non Reactive  Non Reactive Final   Comment: HIV Negative HIV-1/HIV-2 antibodies and HIV-1 p24 antigen were NOT detected. There is no laboratory evidence of HIV infection.   . RPR Ser Ql 07/05/2020 Reactive* Non Reactive Final  . Hepatitis B Surface Ag 07/05/2020 Negative  Negative Final  . Hep C Virus Ab 07/05/2020 <0.1  0.0 - 0.9 s/co ratio Final   Comment:                                   Negative:     < 0.8                              Indeterminate: 0.8 - 0.9                                   Positive:     > 0.9  The CDC recommends that a positive HCV antibody result  be followed up with a HCV Nucleic Acid Amplification  test (096283).   . Neisseria Gonorrhea 07/05/2020 Negative   Final  . Chlamydia 07/05/2020 Negative   Final  . Trichomonas 07/05/2020 Negative   Final  . Bacterial Vaginitis (gardnerella) 07/05/2020 Positive*  Final  . Candida Vaginitis 07/05/2020 Positive*  Final  . Candida Glabrata 07/05/2020 Negative   Final  . Comment 07/05/2020 Normal Reference Range Bacterial Vaginosis - Negative   Final  . Comment 07/05/2020 Normal Reference Range Candida Species - Negative   Final  . Comment 07/05/2020 Normal Reference Range Candida Galbrata - Negative   Final  . Comment 07/05/2020 Normal Reference Range Trichomonas - Negative   Final  . Comment  07/05/2020 Normal Reference Ranger Chlamydia - Negative   Final  . Comment 07/05/2020 Normal Reference Range Neisseria Gonorrhea - Negative   Final  . Rapid Plasma Reagin, Quant 07/05/2020 1:16* NonRea<1:1 Final  .  T Pallidum Abs 07/05/2020 Reactive* Non Reactive Final  Admission on 07/03/2020, Discharged on 07/03/2020  Component Date Value Ref Range Status  . Glucose-Capillary 07/03/2020 362* 70 - 99 mg/dL Final   Glucose reference range applies only to samples taken after fasting for at least 8 hours.  Admission on 06/27/2020, Discharged on 06/27/2020  Component Date Value Ref Range Status  . Color, Urine 06/27/2020 YELLOW  YELLOW Final  . APPearance 06/27/2020 CLOUDY* CLEAR Final  . Specific Gravity, Urine 06/27/2020 1.024  1.005 - 1.030 Final  . pH 06/27/2020 5.0  5.0 - 8.0 Final  . Glucose, UA 06/27/2020 >=500* NEGATIVE mg/dL Final  . Hgb urine dipstick 06/27/2020 SMALL* NEGATIVE Final  . Bilirubin Urine 06/27/2020 NEGATIVE  NEGATIVE Final  . Ketones, ur 06/27/2020 NEGATIVE  NEGATIVE mg/dL Final  . Protein, ur 06/27/2020 30* NEGATIVE mg/dL Final  . Nitrite 06/27/2020 NEGATIVE  NEGATIVE Final  . Chalmers Guest 06/27/2020 SMALL* NEGATIVE Final  . RBC / HPF 06/27/2020 0-5  0 - 5 RBC/hpf Final  . WBC, UA 06/27/2020 6-10  0 - 5 WBC/hpf Final  . Bacteria, UA 06/27/2020 RARE* NONE SEEN Final  . Squamous Epithelial / LPF 06/27/2020 0-5  0 - 5 Final  . Mucus 06/27/2020 PRESENT   Final   Performed at [facility], [place] [address] [place], [place] [address]  . Preg Test, Ur 06/27/2020 NEGATIVE  NEGATIVE Final   Comment:        THE SENSITIVITY OF THIS METHODOLOGY IS >20 mIU/mL. Performed at [facility], [place] [address] [place], [place] [address]   . Neisseria Gonorrhea 06/27/2020 Negative   Final  . Chlamydia 06/27/2020 Negative   Final  . Comment 06/27/2020 Normal Reference Ranger Chlamydia - Negative   Final  . Comment 06/27/2020 Normal Reference  Range Neisseria Gonorrhea - Negative   Final  . Yeast Wet Prep HPF POC 06/27/2020 NONE SEEN  NONE SEEN Final  . Trich, Wet Prep 06/27/2020 NONE SEEN  NONE SEEN Final  . Clue Cells Wet Prep HPF POC 06/27/2020 NONE SEEN  NONE SEEN Final  . WBC, Wet Prep HPF POC 06/27/2020 MANY* NONE SEEN Final  . Sperm 06/27/2020 NONE SEEN   Final   Performed at [facility], [place] [address], [place], [place] [address]  Admission on 06/04/2020, Discharged on 06/05/2020  Component Date Value Ref Range Status  . Sodium 06/04/2020 140  135 - 145 mmol/L Final  . Potassium 06/04/2020 3.6  3.5 - 5.1 mmol/L Final  . Chloride 06/04/2020 105  98 - 111 mmol/L Final  . CO2 06/04/2020 24  22 - 32 mmol/L Final  . Glucose, Bld 06/04/2020 89  70 - 99 mg/dL Final   Glucose reference range applies only to samples taken after fasting for at least 8 hours.  . BUN 06/04/2020 7  6 - 20 mg/dL Final  . Creatinine, Ser 06/04/2020 0.80  0.44 - 1.00 mg/dL Final  . Calcium 06/04/2020 9.4  8.9 - 10.3 mg/dL Final  . Total Protein 06/04/2020 7.3  6.5 - 8.1 g/dL Final  . Albumin 06/04/2020 4.0  3.5 - 5.0 g/dL Final  . AST 06/04/2020 30  15 - 41 U/L Final  . ALT 06/04/2020 26  0 - 44 U/L Final  . Alkaline Phosphatase 06/04/2020 41  38 - 126 U/L Final  . Total Bilirubin 06/04/2020 0.9  0.3 - 1.2 mg/dL Final  . GFR, Estimated 06/04/2020 >60  >60 mL/min Final   Comment: (NOTE)  Calculated using the CKD-EPI Creatinine Equation (2021)   . Anion gap 06/04/2020 11  5 - 15 Final   Performed at [place] [address] [place], [place] [address]  . Alcohol, Ethyl (B) 06/04/2020 <10  <10 mg/dL Final   Comment: (NOTE) Lowest detectable limit for serum alcohol is 10 mg/dL.  For medical purposes only. Performed at [facility], [place] [address], [place], [place] [address]   . Salicylate Lvl 19/41/7408 <7.0* 7.0 - 30.0 mg/dL Final   Performed at [place] [address] [place], [place] [address]   . Acetaminophen (Tylenol), Serum 06/04/2020 <10* 10 - 30 ug/mL Final   Comment: (NOTE) Therapeutic concentrations vary significantly. A range of 10-30 ug/mL  may be an effective concentration for many patients. However, some  are best treated at concentrations outside of this range. Acetaminophen concentrations >150 ug/mL at 4 hours after ingestion  and >50 ug/mL at 12 hours after ingestion are often associated with  toxic reactions.  Performed at [facility], [place] [address], [place], [place] [address]   . WBC 06/04/2020 12.0* 4.0 - 10.5 K/uL Final  . RBC 06/04/2020 4.87  3.87 - 5.11 MIL/uL Final  . Hemoglobin 06/04/2020 12.2  12.0 - 15.0 g/dL Final  . HCT 06/04/2020 37.8  36.0 - 46.0 % Final  . MCV 06/04/2020 77.6* 80.0 - 100.0 fL Final  . MCH 06/04/2020 25.1* 26.0 - 34.0 pg Final  . MCHC 06/04/2020 32.3  30.0 - 36.0 g/dL Final  . RDW 06/04/2020 16.1* 11.5 - 15.5 % Final  . Platelets 06/04/2020 296  150 - 400 K/uL Final  . nRBC 06/04/2020 0.0  0.0 - 0.2 % Final   Performed at [place] [address], [place],  [address]  . I-stat hCG, quantitative 06/04/2020 <5.0  <5 mIU/mL Final  . Comment 3 06/04/2020          Final   Comment:   GEST. AGE      CONC.  (mIU/mL)   <=1 WEEK        5 - 50     2 WEEKS       50 - 500     3 WEEKS       100 - 10,000     4 WEEKS     1,000 - 30,000        FEMALE AND NON-PREGNANT FEMALE:     LESS THAN 5 mIU/mL   . Glucose-Capillary 06/05/2020 139* 70 - 99 mg/dL Final   Glucose reference range applies only to samples taken after fasting for at least 8 hours.  Admission on 06/04/2020, Discharged on 06/04/2020  Component Date Value Ref Range Status  . SARS Coronavirus 2 by RT PCR 06/04/2020 POSITIVE* NEGATIVE Final   Comment: RESULT CALLED TO, READ BACK BY AND VERIFIED WITH: J THOMPKINS RN [id number] 06/04/20 A BROWNING (NOTE) SARS-CoV-2 target nucleic acids are DETECTED.  The SARS-CoV-2 RNA is generally detectable in upper  respiratory specimens during the acute phase of infection. Positive results are indicative of the presence of the identified virus, but do not rule out bacterial infection or co-infection with other pathogens not detected by the test. Clinical correlation with patient history and other diagnostic information is necessary to determine patient infection status. The expected result is Negative.  Fact Sheet for Patients: EntrepreneurPulse.com.au  Fact Sheet for Healthcare Providers: IncredibleEmployment.be  This test is not yet approved or cleared by the [place]  States FDA and  has been authorized for detection and/or diagnosis of SARS-CoV-2 by FDA under an Emergency Use Authorization (EUA).  This EUA will remain in effect (meaning this test ca                          n be used) for the duration of  the COVID-19 declaration under Section 564(b)(1) of the Act, 21 U.S.C. section 360bbb-3(b)(1), unless the authorization is terminated or revoked sooner.    . Influenza A by PCR 06/04/2020 NEGATIVE  NEGATIVE Final  . Influenza B by PCR 06/04/2020 NEGATIVE  NEGATIVE Final   Comment: (NOTE) The Xpert Xpress SARS-CoV-2/FLU/RSV plus assay is intended as an aid in the diagnosis of influenza from Nasopharyngeal swab specimens and should not be used as a sole basis for treatment. Nasal washings and aspirates are unacceptable for Xpert Xpress SARS-CoV-2/FLU/RSV testing.  Fact Sheet for Patients: EntrepreneurPulse.com.au  Fact Sheet for Healthcare Providers: IncredibleEmployment.be  This test is not yet approved or cleared by the [place] FDA and has been authorized for detection and/or diagnosis of SARS-CoV-2 by FDA under an Emergency Use Authorization (EUA). This EUA will remain in effect (meaning this test can be used) for the duration of the COVID-19 declaration under Section 564(b)(1) of the Act, 21  U.S.C. section 360bbb-3(b)(1), unless the authorization is terminated or revoked.  Performed at [facility], [place] [address], [place], [place] [address]   . POC Amphetamine UR 06/04/2020 None Detected  NONE DETECTED (Cut Off Level 1000 ng/mL) Final  . POC Secobarbital (BAR) 06/04/2020 None Detected  NONE DETECTED (Cut Off Level 300 ng/mL) Final  . POC Buprenorphine (BUP) 06/04/2020 None Detected  NONE DETECTED (Cut Off Level 10 ng/mL) Final  . POC Oxazepam (BZO) 06/04/2020 None Detected  NONE DETECTED (Cut Off Level 300 ng/mL) Final  . POC Cocaine UR 06/04/2020 None Detected  NONE DETECTED (Cut Off Level 300 ng/mL) Final  . POC Methamphetamine UR 06/04/2020 None Detected  NONE DETECTED (Cut Off Level 1000 ng/mL) Final  . POC Morphine 06/04/2020 None Detected  NONE DETECTED (Cut Off Level 300 ng/mL) Final  . POC Oxycodone UR 06/04/2020 None Detected  NONE DETECTED (Cut Off Level 100 ng/mL) Final  . POC Methadone UR 06/04/2020 None Detected  NONE DETECTED (Cut Off Level 300 ng/mL) Final  . POC Marijuana UR 06/04/2020 None Detected  NONE DETECTED (Cut Off Level 50 ng/mL) Final  . SARS Coronavirus 2 Ag 06/04/2020 Negative  Negative Final  There may be more visits with results that are not included.    Allergies: Wellbutrin [bupropion], Omnipaque [iohexol], Penicillins, Cogentin [benztropine], Depakote er [divalproex sodium er], and Penicillin g  PTA Medications: (Not in a hospital admission)   Medical Decision Making  Patient was medically cleared in the ED Reviewed lipid panel, TSH, and HgbA1C from 05/03/2020 EKG-QTc 439 on 07/29/2020 and 439 on 05/26/2020  Monitor CBG AC and HS SSI TID with meals-very sensitive scale  Continue home medications -norvasc 5 mg daily for HTN -cymbalta 60 mg daily for depression -haldol 10 mg QHS for schizoaffective disorder -levemir 40 units subcutaneously BID for DM -simvastatin 20 mg daily for hyperlipidemia     Recommendations  Based on  my evaluation the patient does not appear to have an emergency medical condition.  Rozetta Nunnery, NP 08/12/20  11:09 PM

## 2020-08-12 NOTE — ED Notes (Signed)
Pt  A [facility] transfer, presents with suicidal ideations with plan to overdose on her medications.  Primary stressor is her  Current living situation and the bad nightmares she is experiencing.  Denies HI.  Skin search completed, monitoring for safety.  Pt calm & cooperative at present.  Meal given.

## 2020-08-12 NOTE — ED Provider Notes (Signed)
Patient is medically cleared.  She is awaiting disposition according to behavioral   Milton Ferguson, MD 08/12/20 [id number]

## 2020-08-12 NOTE — ED Notes (Signed)
Not able to get pt temp at this time.

## 2020-08-12 NOTE — ED Notes (Signed)
Law Enforcement are at the bedside.  IM medications given as ordered.  Patient is loud and aggressive.  Presently sitting on the bedside.

## 2020-08-12 NOTE — ED Triage Notes (Signed)
Pt comes in with police. Pt states she is upset because her mom doesn't want anything to do with her. She is also upset because her dad did not accept her her friend request. She reports her female friend now doesn't want anything to do with her now. Pt yelling in the hallway and does report having anxiety.

## 2020-08-12 NOTE — BH Assessment (Signed)
Patient has been accepted to the [facility] for admission at 8pm by [name], NP. The attending provider is also [name], NP. [facility] will accept patient to the facility at 8pm. Please call report [phone]. Nursing to arrange patient transport. Patient's nurses Levada Dy, RN) and Caren Griffins, RN were both provided disposition updates. Updates also provided to the the EDP (Dr. Roderic Palau).

## 2020-08-13 LAB — GLUCOSE, CAPILLARY: Glucose-Capillary: 117 mg/dL — ABNORMAL HIGH (ref 70–99)

## 2020-08-13 NOTE — Progress Notes (Signed)
Pt is alert and oriented. Presently resting quietly.  No signs of acute distress noted. Administered med with no incident. Pt endorses SI with plan to overdose. Pt verbally contracts for safety on unit. Pt endorses HI towards her friends who disrespected her and also endorses AVH. Staff will monitor for pt's safety.

## 2020-08-13 NOTE — Discharge Instructions (Addendum)

## 2020-08-13 NOTE — ED Notes (Signed)
Pt sleeping at present, no distress noted, monitoring for safety. 

## 2020-08-13 NOTE — ED Provider Notes (Signed)
FBC/OBS ASAP Discharge Summary  Date and Time: 08/13/2020 4:02 PM  Name: Sherrine Salberg  MRN:  [id number]   Discharge Diagnoses:  Final diagnoses:  Schizoaffective disorder, bipolar type ([place])  Suicidal ideation    Subjective: Patient evaluated at bedside this morning.  She denies any suicidal or homicidal ideations.  He states she feels lonely at her apartment and need more social support.  She states she has an ACT team and they only visit her once every week which is not acceptable to her and she would like them to visit multiple times per week.  She states she would also like them to provide her with more transportation options.  She states she had a good night sleep last night.  She states her appetite is good denies any other complaints. Phone conversation with Lars Masson @ [phone] from ACT team: Ms. Colletta Maryland is contacted regarding patient's presentation to urgent care.  She is informed about patient denying suicidal, homicidal ideations but complaining about not getting enough resources from her ACT team.  Ms. Colletta Maryland shows good understanding of her presentation and states she would follow-up with patient after she is discharged.  Stay Summary: Suan Pyeatt is a 30 year old female with past psychiatric history of schizoaffective disorder, bipolar type who was a direct admit from [facility] for assessment of her ongoing suicidal ideations. She was restarted on her home medications Haloperidol 10 mg, propranolol 10 mg twice daily, Cogentin 1 mg, duloxetine 60 mg.  She is followed by an act team and lives in her apartment by herself. On reevaluation patient denies suicidal ideation, homicidal ideation or any auditory or visual hallucinations.  She felt better and was ready to discharge back to her home.  Her ACT team was contacted and they were informed about patient's presentation to urgent care and they were appreciated of this  information.  Total Time spent with patient: 20 minutes  Past Psychiatric History: He is affective disorder, bipolar, substance abuse disorder, previous suicide attempt  Past Medical History:  Past Medical History:  Diagnosis Date  . Anxiety   . Bipolar 1 disorder ([place])   . Cognitive deficits   . Depression   . Diabetes mellitus without complication ([place])   . Hypertension   . Mental disorder   . Mental health disorder   . Obesity     Past Surgical History:  Procedure Laterality Date  . CESAREAN SECTION    . CESAREAN SECTION N/A 04/25/2013   Procedure: REPEAT CESAREAN SECTION;  Surgeon: [name], MD;  Location: [facility];  Service: Obstetrics;  Laterality: N/A;  . MASS EXCISION N/A 06/03/2012   Procedure: EXCISION MASS;  Surgeon: [name], MD;  Location: [facility];  Service: ENT;  Laterality: N/A;  Excision uvula mass  . TONSILLECTOMY N/A 06/03/2012   Procedure: TONSILLECTOMY;  Surgeon: [name], MD;  Location: [facility];  Service: ENT;  Laterality: N/A;  . TONSILLECTOMY     Family History:  Family History  Problem Relation Age of Onset  . Hypertension Mother   . Diabetes Father    Family Psychiatric History: Denies any family psychiatric history Social History:  Social History   Substance and Sexual Activity  Alcohol Use Not Currently   Comment: occ: last intake 4 mts ago     Social History   Substance and Sexual Activity  Drug Use Not Currently  . Types: "Crack" cocaine, Other-see comments   Comment: Patient reports  hx of smoking Crack    Social History   Socioeconomic History  . Marital status: Single    Spouse name: Not on file  . Number of children: Not on file  . Years of education: Not on file  . Highest education level: Not on file  Occupational History  . Not on file  Tobacco Use  . Smoking status: Current Every Day Smoker    Packs/day: 4.00    Years: 11.00    Pack years: 44.00    Types: Cigarettes,  Cigars  . Smokeless tobacco: Never Used  . Tobacco comment: Pt declined  Vaping Use  . Vaping Use: Never used  Substance and Sexual Activity  . Alcohol use: Not Currently    Comment: occ: last intake 4 mts ago  . Drug use: Not Currently    Types: "Crack" cocaine, Other-see comments    Comment: Patient reports hx of smoking Crack  . Sexual activity: Yes    Birth control/protection: Implant  Other Topics Concern  . Not on file  Social History Narrative   ** Merged History Encounter **       Social Determinants of Health   Financial Resource Strain: Not on file  Food Insecurity: Not on file  Transportation Needs: Not on file  Physical Activity: Not on file  Stress: Not on file  Social Connections: Not on file   SDOH:  SDOH Screenings   Alcohol Screen: Low Risk   . Last Alcohol Screening Score (AUDIT): 3  Depression (PHQ2-9): Low Risk   . PHQ-2 Score: 4  Financial Resource Strain: Not on file  Food Insecurity: Not on file  Housing: Not on file  Physical Activity: Not on file  Social Connections: Not on file  Stress: Not on file  Tobacco Use: High Risk  . Smoking Tobacco Use: Current Every Day Smoker  . Smokeless Tobacco Use: Never Used  Transportation Needs: Not on file    Has this patient used any form of tobacco in the last 30 days? No Current Medications:  Current Facility-Administered Medications  Medication Dose Route Frequency Provider Last Rate Last Admin  . acetaminophen (TYLENOL) tablet 650 mg  650 mg Oral Q6H PRN Lindon Romp A, NP      . alum & mag hydroxide-simeth (MAALOX/MYLANTA) 200-200-20 MG/5ML suspension 30 mL  30 mL Oral Q4H PRN Lindon Romp A, NP      . amLODipine (NORVASC) tablet 5 mg  5 mg Oral Daily Lindon Romp A, NP   5 mg at 08/13/20 5852  . benztropine (COGENTIN) tablet 1 mg  1 mg Oral BID Lindon Romp A, NP   1 mg at 08/13/20 7782  . DULoxetine (CYMBALTA) DR capsule 60 mg  60 mg Oral Daily Lindon Romp A, NP   60 mg at 08/13/20 4235  .  haloperidol (HALDOL) tablet 10 mg  10 mg Oral QHS Lindon Romp A, NP   10 mg at 08/12/20 2259  . insulin aspart (novoLOG) injection 0-6 Units  0-6 Units Subcutaneous TID WC Lindon Romp A, NP      . insulin detemir (LEVEMIR) injection 40 Units  40 Units Subcutaneous BID Lindon Romp A, NP   40 Units at 08/13/20 0923  . magnesium hydroxide (MILK OF MAGNESIA) suspension 30 mL  30 mL Oral Daily PRN Lindon Romp A, NP      . propranolol (INDERAL) tablet 10 mg  10 mg Oral BID Lindon Romp A, NP   10 mg at 08/13/20 3614  . simvastatin (  ZOCOR) tablet 20 mg  20 mg Oral QHS Lindon Romp A, NP   20 mg at 08/12/20 2300   Current Outpatient Medications  Medication Sig Dispense Refill  . amLODipine (NORVASC) 5 MG tablet Take 5 mg by mouth daily.    . benztropine (COGENTIN) 1 MG tablet Take 1 mg by mouth 2 (two) times daily. (Patient not taking: No sig reported)    . cholecalciferol (VITAMIN D) 25 MCG (1000 UNIT) tablet Take 1,000 Units by mouth daily.    . DULoxetine (CYMBALTA) 60 MG capsule Take 1 capsule (60 mg total) by mouth daily. (Patient not taking: No sig reported)  3  . haloperidol (HALDOL) 10 MG tablet Take 10 mg by mouth at bedtime.    . haloperidol decanoate (HALDOL DECANOATE) 50 MG/ML injection Inject 50 mg into the muscle every 28 (twenty-eight) days.    . hydrOXYzine (ATARAX/VISTARIL) 25 MG tablet Take 1 tablet (25 mg total) by mouth 3 (three) times daily as needed for anxiety. 30 tablet 0  . insulin detemir (LEVEMIR) 100 UNIT/ML injection Inject 0.4 mLs (40 Units total) into the skin 2 (two) times daily. 10 mL 11  . propranolol (INDERAL) 10 MG tablet Take 1 tablet (10 mg total) by mouth 2 (two) times daily. 60 tablet 0  . vitamin B-12 (CYANOCOBALAMIN) 1000 MCG tablet Take 1,000 mcg by mouth daily.      PTA Medications: (Not in a hospital admission)   Musculoskeletal  Strength & Muscle Tone: within normal limits Gait & Station: normal Patient leans: N/A  Psychiatric Specialty Exam   Presentation  General Appearance: Appropriate for Environment  Eye Contact:Good  Speech:Clear and Coherent  Speech Volume:Normal  Handedness:Right   Mood and Affect  Mood:Dysphoric  Affect:Constricted   Thought Process  Thought Processes:Coherent  Descriptions of Associations:Intact  Orientation:Full (Time, Place and Person)  Thought Content:Logical  Diagnosis of Schizophrenia or Schizoaffective disorder in past: Yes    Hallucinations:Hallucinations: None  Ideas of Reference:None  Suicidal Thoughts:Suicidal Thoughts: No SI Active Intent and/or Plan: With Intent; With Plan; With Means to Carry Out  Homicidal Thoughts:Homicidal Thoughts: No HI Passive Intent and/or Plan: Without Intent; Without Plan   Sensorium  Memory:Immediate Good; Recent Good; Remote Fair  Judgment:Fair  Insight:Fair   Executive Functions  Concentration:Good  Attention Span:Good  [place]  Language:Good   Psychomotor Activity  Psychomotor Activity:Psychomotor Activity: Normal   Assets  Assets:Communication Skills; Desire for Improvement; Housing; Resilience   Sleep  Sleep:Sleep: Good   Nutritional Assessment (For OBS and FBC admissions only) Has the patient had a weight loss or gain of 10 pounds or more in the last 3 months?: No Has the patient had a decrease in food intake/or appetite?: No Does the patient have dental problems?: No Does the patient have eating habits or behaviors that may be indicators of an eating disorder including binging or inducing vomiting?: No Has the patient recently lost weight without trying?: No Has the patient been eating poorly because of a decreased appetite?: No Malnutrition Screening Tool Score: 0    Physical Exam  Physical Exam Vitals and nursing note reviewed.  Constitutional:      General: She is not in acute distress.    Appearance: She is well-developed. She is obese.  HENT:     Head:  Normocephalic and atraumatic.  Eyes:     Conjunctiva/sclera: Conjunctivae normal.  Cardiovascular:     Rate and Rhythm: Normal rate and regular rhythm.     Heart sounds:  No murmur heard.   Pulmonary:     Effort: Pulmonary effort is normal. No respiratory distress.     Breath sounds: Normal breath sounds.  Abdominal:     Palpations: Abdomen is soft.     Tenderness: There is no abdominal tenderness.  Musculoskeletal:     Cervical back: Neck supple.  Skin:    General: Skin is warm and dry.  Neurological:     Mental Status: She is alert and oriented to person, place, and time.    Review of Systems  Constitutional: Negative.   HENT: Negative.   Eyes: Negative.   Respiratory: Negative.   Cardiovascular: Negative.   Gastrointestinal: Negative.   Musculoskeletal: Negative.   Neurological: Negative.   Psychiatric/Behavioral: The patient is nervous/anxious.    Blood pressure 122/88, pulse 82, temperature 99.1 F (37.3 C), temperature source Oral, resp. rate 16, SpO2 100 %. There is no height or weight on file to calculate BMI.  Demographic Factors:  Low socioeconomic status and Living alone  Loss Factors: NA  Historical Factors: Prior suicide attempts  Risk Reduction Factors:   Positive therapeutic relationship  Continued Clinical Symptoms:  Alcohol/Substance Abuse/Dependencies Previous Psychiatric Diagnoses and Treatments  Cognitive Features That Contribute To Risk:  Thought constriction (tunnel vision)    Suicide Risk:  Minimal: No identifiable suicidal ideation.  Patients presenting with no risk factors but with morbid ruminations; may be classified as minimal risk based on the severity of the depressive symptoms  Assessment: 30 year old female with past psychiatric history of schizoaffective disorder, bipolar type presented as a voluntary direct admit from [facility] for observation and further evaluation of her  ongoing suicidal ideations. --On evaluation patient denies any suicidal or homicidal ideations.  She denies any auditory visual hallucination and she does not seem like responding to internal stimuli.  Patient looks dysphoric due to social isolation and her living by herself, not in contact with her family.  She is encouraged to have a relationship with her parents.  She has an ACT team following her and [facility].  She is alert, awake, oriented to time place and person and has fair insight into her diagnosis and treatment.  She has illogical demands from her ACT team and reason for her presentation. --UDS was positive for barbiturates.  Her blood glucose is 156, hemoglobin 11.8, COVID-negative --Patient is not suicidal, homicidal or psychotic and does not meet criteria for inpatient psychiatric hospitalization.  Patient's ACT team was contacted and they were informed about patient presentation to the urgent care and they will follow up with patient after her discharge.  Patient did not require any prescription medication due to her act team taking care of her medications.  She was provided with safe transport to her apartment.  She would benefit from outpatient services.  She was found to be taking barbiturates, commonly depressive was counseled not to use drugs and her prescription medication adherence.  She shows fair understanding about it.  Plan Of Care/Follow-up recommendations:  Activity:  Normal Diet:  Normal -- Patient does not meet criteria for inpatient psychiatric hospitalization as she is not suicidal, homicidal or psychotic and would benefit from continuation with her ACT team and outpatient services.  Disposition: Self care, safe transport to he apartment  Honor Junes, MD 08/13/2020, 4:02 PM  PGY-1, Resident

## 2020-08-13 NOTE — ED Notes (Signed)
Pt sleeping at present, no distress noted.  Monitoring for safety. 

## 2020-08-13 NOTE — Discharge Summary (Signed)
[name] to be D/C'd  Home per MD order. Discussed with the patient and all questions fully answered. An After Visit Summary was printed and given to the patient. Patient escorted out and D/C home via private auto.  [name]  08/13/2020 12:38 PM

## 2020-08-26 ENCOUNTER — Other Ambulatory Visit: Payer: Self-pay

## 2020-08-26 ENCOUNTER — Ambulatory Visit (HOSPITAL_COMMUNITY)
Admission: EM | Admit: 2020-08-26 | Discharge: 2020-08-27 | Disposition: A | Discharge status: Home / Self Care | Payer: Medicaid Other | Attending: Psychiatry | Admitting: Psychiatry

## 2020-08-26 DIAGNOSIS — Z79899 Other long term (current) drug therapy: Secondary | ICD-10-CM | POA: Insufficient documentation

## 2020-08-26 DIAGNOSIS — F17290 Nicotine dependence, other tobacco product, uncomplicated: Secondary | ICD-10-CM | POA: Insufficient documentation

## 2020-08-26 DIAGNOSIS — F250 Schizoaffective disorder, bipolar type: Secondary | ICD-10-CM | POA: Insufficient documentation

## 2020-08-26 DIAGNOSIS — R45851 Suicidal ideations: Secondary | ICD-10-CM | POA: Insufficient documentation

## 2020-08-26 DIAGNOSIS — F17210 Nicotine dependence, cigarettes, uncomplicated: Secondary | ICD-10-CM | POA: Insufficient documentation

## 2020-08-26 DIAGNOSIS — Z20822 Contact with and (suspected) exposure to covid-19: Secondary | ICD-10-CM | POA: Insufficient documentation

## 2020-08-26 LAB — CBC WITH DIFFERENTIAL/PLATELET
Abs Immature Granulocytes: 0.05 10*3/uL (ref 0.00–0.07)
Basophils Absolute: 0.1 10*3/uL (ref 0.0–0.1)
Basophils Relative: 1 %
Eosinophils Absolute: 0.3 10*3/uL (ref 0.0–0.5)
Eosinophils Relative: 2 %
HCT: 35.4 % — ABNORMAL LOW (ref 36.0–46.0)
Hemoglobin: 11.1 g/dL — ABNORMAL LOW (ref 12.0–15.0)
Immature Granulocytes: 0 %
Lymphocytes Relative: 25 %
Lymphs Abs: 3.2 10*3/uL (ref 0.7–4.0)
MCH: 23.9 pg — ABNORMAL LOW (ref 26.0–34.0)
MCHC: 31.4 g/dL (ref 30.0–36.0)
MCV: 76.3 fL — ABNORMAL LOW (ref 80.0–100.0)
Monocytes Absolute: 0.7 10*3/uL (ref 0.1–1.0)
Monocytes Relative: 5 %
Neutro Abs: 8.5 10*3/uL — ABNORMAL HIGH (ref 1.7–7.7)
Neutrophils Relative %: 67 %
Platelets: 256 10*3/uL (ref 150–400)
RBC: 4.64 MIL/uL (ref 3.87–5.11)
RDW: 15 % (ref 11.5–15.5)
WBC: 12.8 10*3/uL — ABNORMAL HIGH (ref 4.0–10.5)
nRBC: 0 % (ref 0.0–0.2)

## 2020-08-26 LAB — COMPREHENSIVE METABOLIC PANEL
ALT: 19 U/L (ref 0–44)
AST: 18 U/L (ref 15–41)
Albumin: 4.2 g/dL (ref 3.5–5.0)
Alkaline Phosphatase: 56 U/L (ref 38–126)
Anion gap: 6 (ref 5–15)
BUN: 13 mg/dL (ref 6–20)
CO2: 26 mmol/L (ref 22–32)
Calcium: 9.5 mg/dL (ref 8.9–10.3)
Chloride: 105 mmol/L (ref 98–111)
Creatinine, Ser: 0.71 mg/dL (ref 0.44–1.00)
GFR, Estimated: >60 mL/min (ref >60)
Glucose, Bld: 118 mg/dL — ABNORMAL HIGH (ref 70–99)
Potassium: 4.4 mmol/L (ref 3.5–5.1)
Sodium: 137 mmol/L (ref 135–145)
Total Bilirubin: 0.4 mg/dL (ref 0.3–1.2)
Total Protein: 7.2 g/dL (ref 6.5–8.1)

## 2020-08-26 LAB — RESP PANEL BY RT-PCR (FLU A&B, COVID) ARPGX2
Influenza A by PCR: NEGATIVE
Influenza B by PCR: NEGATIVE
SARS Coronavirus 2 by RT PCR: NEGATIVE

## 2020-08-26 LAB — LIPID PANEL
Cholesterol: 172 mg/dL (ref 0–200)
HDL: 50 mg/dL (ref >40)
LDL Cholesterol: 89 mg/dL (ref 0–99)
Total CHOL/HDL Ratio: 3.4 RATIO
Triglycerides: 166 mg/dL — ABNORMAL HIGH (ref <150)
VLDL: 33 mg/dL (ref 0–40)

## 2020-08-26 LAB — POC SARS CORONAVIRUS 2 AG -  ED: SARS Coronavirus 2 Ag: NEGATIVE

## 2020-08-26 LAB — HEMOGLOBIN A1C
Hgb A1c MFr Bld: 7.3 % — ABNORMAL HIGH (ref 4.8–5.6)
Mean Plasma Glucose: 162.81 mg/dL

## 2020-08-26 LAB — ETHANOL: Alcohol, Ethyl (B): <10 mg/dL (ref <10)

## 2020-08-26 LAB — TSH: TSH: 0.758 u[IU]/mL (ref 0.350–4.500)

## 2020-08-26 MED ORDER — DIPHENHYDRAMINE HCL 50 MG/ML IJ SOLN: 50.0000 mg | Freq: Four times a day (QID) | INTRAMUSCULAR | Status: DC | PRN

## 2020-08-26 MED ORDER — LORAZEPAM 2 MG/ML IJ SOLN
INTRAMUSCULAR | Status: AC
  Administered 2020-08-26: 2 mg
  Filled 2020-08-26: qty 1

## 2020-08-26 MED ORDER — DIPHENHYDRAMINE HCL 50 MG PO CAPS: 50.0000 mg | ORAL_CAPSULE | Freq: Four times a day (QID) | ORAL | Status: DC | PRN

## 2020-08-26 MED ORDER — LORAZEPAM 1 MG PO TABS: 2.0000 mg | ORAL_TABLET | Freq: Four times a day (QID) | ORAL | Status: DC | PRN

## 2020-08-26 MED ORDER — HYDROXYZINE HCL 25 MG PO TABS: 25.0000 mg | ORAL_TABLET | Freq: Three times a day (TID) | ORAL | Status: DC | PRN

## 2020-08-26 MED ORDER — HALOPERIDOL 5 MG PO TABS: 5.0000 mg | ORAL_TABLET | Freq: Four times a day (QID) | ORAL | Status: DC | PRN

## 2020-08-26 MED ORDER — DIVALPROEX SODIUM 500 MG PO DR TAB
500.0000 mg | DELAYED_RELEASE_TABLET | Freq: Two times a day (BID) | ORAL | Status: DC
  Administered 2020-08-26 – 2020-08-27 (×3): 500 mg via ORAL
  Filled 2020-08-26 (×3): qty 1

## 2020-08-26 MED ORDER — MAGNESIUM HYDROXIDE 400 MG/5ML PO SUSP: 30.0000 mL | Freq: Every day | ORAL | Status: DC | PRN

## 2020-08-26 MED ORDER — AMLODIPINE BESYLATE 5 MG PO TABS
5.0000 mg | ORAL_TABLET | Freq: Every day | ORAL | Status: DC
  Administered 2020-08-26 – 2020-08-27 (×2): 5 mg via ORAL
  Filled 2020-08-26 (×2): qty 1

## 2020-08-26 MED ORDER — INSULIN DETEMIR 100 UNIT/ML ~~LOC~~ SOLN
40.0000 [IU] | Freq: Two times a day (BID) | SUBCUTANEOUS | Status: DC
  Administered 2020-08-26 – 2020-08-27 (×3): 40 [IU] via SUBCUTANEOUS

## 2020-08-26 MED ORDER — LORAZEPAM 2 MG/ML IJ SOLN: 2.0000 mg | Freq: Four times a day (QID) | INTRAMUSCULAR | Status: DC | PRN

## 2020-08-26 MED ORDER — ACETAMINOPHEN 325 MG PO TABS: 650.0000 mg | ORAL_TABLET | Freq: Four times a day (QID) | ORAL | Status: DC | PRN

## 2020-08-26 MED ORDER — ALUM & MAG HYDROXIDE-SIMETH 200-200-20 MG/5ML PO SUSP: 30.0000 mL | ORAL | Status: DC | PRN

## 2020-08-26 MED ORDER — BENZTROPINE MESYLATE 1 MG PO TABS
1.0000 mg | ORAL_TABLET | Freq: Two times a day (BID) | ORAL | Status: DC
  Administered 2020-08-26 – 2020-08-27 (×3): 1 mg via ORAL
  Filled 2020-08-26 (×3): qty 1

## 2020-08-26 MED ORDER — HALOPERIDOL LACTATE 5 MG/ML IJ SOLN
INTRAMUSCULAR | Status: AC
  Administered 2020-08-26: 5 mg
  Filled 2020-08-26: qty 1

## 2020-08-26 MED ORDER — HALOPERIDOL LACTATE 5 MG/ML IJ SOLN: 5.0000 mg | Freq: Once | INTRAMUSCULAR | Status: AC

## 2020-08-26 MED ORDER — HALOPERIDOL 5 MG PO TABS
10.0000 mg | ORAL_TABLET | Freq: Every day | ORAL | Status: DC
  Administered 2020-08-27: 10 mg via ORAL
  Filled 2020-08-26: qty 2

## 2020-08-26 MED ORDER — HALOPERIDOL LACTATE 5 MG/ML IJ SOLN: 10.0000 mg | Freq: Four times a day (QID) | INTRAMUSCULAR | Status: DC | PRN

## 2020-08-26 MED ORDER — LORAZEPAM 2 MG/ML IJ SOLN: 2.0000 mg | Freq: Once | INTRAMUSCULAR | Status: AC

## 2020-08-26 NOTE — ED Notes (Signed)
Patient sleeping. RR even and unlabored.

## 2020-08-26 NOTE — ED Notes (Signed)
Patient transferred from adult unit
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
## 2020-08-26 NOTE — ED Provider Notes (Signed)
Behavioral Health Admission H&P [facility] & OBS)  Date: 08/26/20 Patient Name: [name] MRN: [id number] Chief Complaint:  Chief Complaint  Patient presents with  . Urgent Emergent Evaluation      Diagnoses:  Final diagnoses:  Schizoaffective disorder, bipolar type [facility])    HPI: Patient is a 30 year old female that is well-known to the psychiatric service line.  Patient presents today voluntarily accompanied by law enforcement that she called herself.  Patient reports that she is suicidal and homicidal.  She reports that she is gotten into an argument with her mom recently over her [name] and her mom also told her not to call her anymore.  She states that she is homicidal towards her boyfriend and at times calls him her ex-boyfriend because he was talking to another girl and she wants to choke him to death.  She also reports that she is suicidal because she is tired of always feeling angry.  She states that she was almost kicked off of a city bus because of arguments.  She states that she is always arguing with her mom and her boyfriend and wanted to stop.  She states that she is still with [facility].  She states she still has her house to live in on [address] but would like to move.  She reports that she is not lonely or bored anymore because she has a boyfriend and she has other people that she is hanging out with.  Spoke with Dr. Jeannine Kitten and confirmed patient's medications and discussed what medications would be appropriate for this patient.  He recommended either Seroquel 25 mg p.o. 3 times daily or Depakote 500 mg p.o. 3 times daily.  Discussed with patient her allergies and patient only reported having an allergy to penicillin and contrast dye.  Patient agreed to start Depakote 500 mg p.o. twice daily to ensure that she tolerates it well.  Patient will be admitted to the continuous observation unit for overnight assessment. Patient is well-known for coming to the emergency department  or to the BHU C reporting suicidal and homicidal ideations.  Patient is normally discharged the next day with some type of medication change.  Patient does present a little bit differently this time with having a boyfriend that is talking to another girl that is upsetting her but still continues to ask for a 3 to 5-day stay in the hospital.  PHQ 2-9:  Flowsheet Row Clinical Support from 07/05/2020 in [facility] ED from 05/26/2020 in [facility] from 05/22/2020 in [facility]  Thoughts that you would be better off dead, or of hurting yourself in some way Not at all More than half the days Nearly every day  PHQ-9 Total Score Flowsheet Row ED from 08/12/2020 in [facility] Most recent reading at 08/12/2020  9:17 PM ED from 08/12/2020 in [facility] Most recent reading at 08/12/2020  4:49 PM ED from 07/29/2020 in [facility] Most recent reading at 07/29/2020  9:17 PM  C-SSRS RISK CATEGORY High Risk High Risk High Risk       Total Time spent with patient: 30 minutes  Musculoskeletal  Strength & Muscle Tone: within normal limits Gait & Station: normal Patient leans: N/A  Psychiatric Specialty Exam  Presentation General Appearance: Appropriate for Environment; Casual; Well Groomed  Eye Contact:Good  Speech:Clear  and Coherent; Normal Rate  Speech Volume:Increased  Handedness:Right   Mood and Affect  Mood:Depressed; Anxious  Affect:Appropriate; Congruent   Thought Process  Thought Processes:Coherent  Descriptions of Associations:Intact  Orientation:Full (Time, Place and Person)  Thought Content:WDL  Diagnosis of Schizophrenia or Schizoaffective disorder in past: Yes   Hallucinations:Hallucinations: None  Ideas of Reference:None  Suicidal Thoughts:Suicidal  Thoughts: Yes, Active SI Active Intent and/or Plan: With Plan  Homicidal Thoughts:Homicidal Thoughts: Yes, Active HI Active Intent and/or Plan: With Intent; With Plan; With Means to Carry Out; With Access to Means   Sensorium  Memory:Immediate Good; Recent Good; Remote Good  Judgment:Fair  Insight:Fair   Executive Functions  Concentration:Good  Attention Span:Good  Recall:Good  Fund of Knowledge:Fair  Language:Good   Psychomotor Activity  Psychomotor Activity:Psychomotor Activity: Normal   Assets  Assets:Communication Skills; Desire for Improvement; Financial Resources/Insurance; Housing; Physical Health; Social Support; Transportation   Sleep  Sleep:Sleep: Good   Nutritional Assessment (For OBS and FBC admissions only) Has the patient had a weight loss or gain of 10 pounds or more in the last 3 months?: No Has the patient had a decrease in food intake/or appetite?: No Does the patient have dental problems?: No Does the patient have eating habits or behaviors that may be indicators of an eating disorder including binging or inducing vomiting?: No Has the patient recently lost weight without trying?: No Has the patient been eating poorly because of a decreased appetite?: No Malnutrition Screening Tool Score: 0    Physical Exam Vitals and nursing note reviewed.  Constitutional:      Appearance: She is well-developed.  HENT:     Head: Normocephalic.  Eyes:     Pupils: Pupils are equal, round, and reactive to light.  Cardiovascular:     Rate and Rhythm: Normal rate.  Pulmonary:     Effort: Pulmonary effort is normal.  Musculoskeletal:        General: Normal range of motion.  Neurological:     Mental Status: She is alert and oriented to person, place, and time.    Review of Systems  Constitutional: Negative.   HENT: Negative.   Eyes: Negative.   Respiratory: Negative.   Cardiovascular: Negative.   Gastrointestinal: Negative.   Genitourinary:  Negative.   Musculoskeletal: Negative.   Skin: Negative.   Neurological: Negative.   Endo/Heme/Allergies: Negative.   Psychiatric/Behavioral: Positive for depression and suicidal ideas. The patient is nervous/anxious.     Blood pressure 127/80, pulse (!) 102, temperature 98 F (36.7 C), temperature source Oral, resp. rate 20, SpO2 100 %. There is no height or weight on file to calculate BMI.  Past Psychiatric History: Schizoaffective, MDD, anxiety, malingering, numerous ED visits and hospitalizations   Is the patient at risk to self? Yes  Has the patient been a risk to self in the past 6 months? Yes .    Has the patient been a risk to self within the distant past? Yes   Is the patient a risk to others? Yes   Has the patient been a risk to others in the past 6 months? Yes   Has the patient been a risk to others within the distant past? Yes   Past Medical History:  Past Medical History:  Diagnosis Date  . Anxiety   . Bipolar 1 disorder (HCC)   . Cognitive deficits   . Depression   . Diabetes mellitus without complication (HCC)   . Hypertension   . Mental disorder   . Mental health  disorder   . Obesity     Past Surgical History:  Procedure Laterality Date  . CESAREAN SECTION    . CESAREAN SECTION N/A 04/25/2013   Procedure: REPEAT CESAREAN SECTION;  Surgeon: Catalina Antigua, MD;  Location: [facility];  Service: Obstetrics;  Laterality: N/A;  . MASS EXCISION N/A 06/03/2012   Procedure: EXCISION MASS;  Surgeon: Osborn Coho, MD;  Location: [facility];  Service: ENT;  Laterality: N/A;  Excision uvula mass  . TONSILLECTOMY N/A 06/03/2012   Procedure: TONSILLECTOMY;  Surgeon: Osborn Coho, MD;  Location: [facility];  Service: ENT;  Laterality: N/A;  . TONSILLECTOMY      Family History:  Family History  Problem Relation Age of Onset  . Hypertension Mother   . Diabetes Father     Social History:  Social History   Socioeconomic History  .  Marital status: Single    Spouse name: Not on file  . Number of children: Not on file  . Years of education: Not on file  . Highest education level: Not on file  Occupational History  . Not on file  Tobacco Use  . Smoking status: Current Every Day Smoker    Packs/day: 4.00    Years: 11.00    Pack years: 44.00    Types: Cigarettes, Cigars  . Smokeless tobacco: Never Used  . Tobacco comment: Pt declined  Vaping Use  . Vaping Use: Never used  Substance and Sexual Activity  . Alcohol use: Not Currently    Comment: occ: last intake 4 mts ago  . Drug use: Not Currently    Types: "Crack" cocaine, Other-see comments    Comment: Patient reports hx of smoking Crack  . Sexual activity: Yes    Birth control/protection: Implant  Other Topics Concern  . Not on file  Social History Narrative   ** Merged History Encounter **       Social Determinants of Health   Financial Resource Strain: Not on file  Food Insecurity: Not on file  Transportation Needs: Not on file  Physical Activity: Not on file  Stress: Not on file  Social Connections: Not on file  Intimate Partner Violence: Not on file    SDOH:  SDOH Screenings   Alcohol Screen: Low Risk   . Last Alcohol Screening Score (AUDIT): 3  Depression (PHQ2-9): Low Risk   . PHQ-2 Score: 4  Financial Resource Strain: Not on file  Food Insecurity: Not on file  Housing: Not on file  Physical Activity: Not on file  Social Connections: Not on file  Stress: Not on file  Tobacco Use: High Risk  . Smoking Tobacco Use: Current Every Day Smoker  . Smokeless Tobacco Use: Never Used  Transportation Needs: Not on file    Last Labs:  Admission on 08/12/2020, Discharged on 08/13/2020  Component Date Value Ref Range Status  . Glucose-Capillary 08/12/2020 175* 70 - 99 mg/dL Final   Glucose reference range applies only to samples taken after fasting for at least 8 hours.  . Glucose-Capillary 08/13/2020 117* 70 - 99 mg/dL Final   Glucose  reference range applies only to samples taken after fasting for at least 8 hours.  Admission on 08/12/2020, Discharged on 08/12/2020  Component Date Value Ref Range Status  . Opiates 08/12/2020 NONE DETECTED  NONE DETECTED Final  . Cocaine 08/12/2020 NONE DETECTED  NONE DETECTED Final  . Benzodiazepines 08/12/2020 NONE DETECTED  NONE DETECTED Final  . Amphetamines 08/12/2020 NONE DETECTED  NONE DETECTED  Final  . Tetrahydrocannabinol 08/12/2020 NONE DETECTED  NONE DETECTED Final  . Barbiturates 08/12/2020 POSITIVE* NONE DETECTED Final   Comment: (NOTE) DRUG SCREEN FOR MEDICAL PURPOSES ONLY.  IF CONFIRMATION IS NEEDED FOR ANY PURPOSE, NOTIFY LAB WITHIN 5 DAYS.  LOWEST DETECTABLE LIMITS FOR URINE DRUG SCREEN Drug Class                     Cutoff (ng/mL) Amphetamine and metabolites    1000 Barbiturate and metabolites    200 Benzodiazepine                 732 Tricyclics and metabolites     300 Opiates and metabolites        300 Cocaine and metabolites        300 THC                            50 Performed at [facility], [place] [address] [place], [place] [address]   . Alcohol, Ethyl (B) 08/12/2020 <10  <10 mg/dL Final   Comment: (NOTE) Lowest detectable limit for serum alcohol is 10 mg/dL.  For medical purposes only. Performed at [facility], [place] [address] [place], [place] [address]   . WBC 08/12/2020 10.1  4.0 - 10.5 K/uL Final  . RBC 08/12/2020 4.92  3.87 - 5.11 MIL/uL Final  . Hemoglobin 08/12/2020 11.8* 12.0 - 15.0 g/dL Final  . HCT 08/12/2020 38.4  36.0 - 46.0 % Final  . MCV 08/12/2020 78.0* 80.0 - 100.0 fL Final  . MCH 08/12/2020 24.0* 26.0 - 34.0 pg Final  . MCHC 08/12/2020 30.7  30.0 - 36.0 g/dL Final  . RDW 08/12/2020 15.7* 11.5 - 15.5 % Final  . Platelets 08/12/2020 225  150 - 400 K/uL Final  . nRBC 08/12/2020 0.0  0.0 - 0.2 % Final  . Neutrophils Relative % 08/12/2020 56  % Final  . Neutro Abs 08/12/2020 5.6  1.7 - 7.7 K/uL  Final  . Lymphocytes Relative 08/12/2020 35  % Final  . Lymphs Abs 08/12/2020 3.6  0.7 - 4.0 K/uL Final  . Monocytes Relative 08/12/2020 4  % Final  . Monocytes Absolute 08/12/2020 0.4  0.1 - 1.0 K/uL Final  . Eosinophils Relative 08/12/2020 4  % Final  . Eosinophils Absolute 08/12/2020 0.4  0.0 - 0.5 K/uL Final  . Basophils Relative 08/12/2020 1  % Final  . Basophils Absolute 08/12/2020 0.1  0.0 - 0.1 K/uL Final  . Immature Granulocytes 08/12/2020 0  % Final  . Abs Immature Granulocytes 08/12/2020 0.03  0.00 - 0.07 K/uL Final   Performed at [facility], [place] [address] [place], [place] [address]  . Sodium 08/12/2020 141  135 - 145 mmol/L Final  . Potassium 08/12/2020 3.8  3.5 - 5.1 mmol/L Final  . Chloride 08/12/2020 108  98 - 111 mmol/L Final  . CO2 08/12/2020 25  22 - 32 mmol/L Final  . Glucose, Bld 08/12/2020 156* 70 - 99 mg/dL Final   Glucose reference range applies only to samples taken after fasting for at least 8 hours.  . BUN 08/12/2020 11  6 - 20 mg/dL Final  . Creatinine, Ser 08/12/2020 0.79  0.44 - 1.00 mg/dL Final  . Calcium 08/12/2020 9.4  8.9 - 10.3 mg/dL Final  . Total Protein 08/12/2020 7.9  6.5 - 8.1 g/dL Final  . Albumin 08/12/2020 4.2  3.5 - 5.0 g/dL  Final  . AST 08/12/2020 25  15 - 41 U/L Final  . ALT 08/12/2020 19  0 - 44 U/L Final  . Alkaline Phosphatase 08/12/2020 52  38 - 126 U/L Final  . Total Bilirubin 08/12/2020 0.6  0.3 - 1.2 mg/dL Final  . GFR, Estimated 08/12/2020 >60  >60 mL/min Final   Comment: (NOTE) Calculated using the CKD-EPI Creatinine Equation (2021)   . Anion gap 08/12/2020 8  5 - 15 Final   Performed at [facility], [address] [place], [place] [id number]  . I-stat hCG, quantitative 08/12/2020 <5.0  <5 mIU/mL Final  . Comment 3 08/12/2020          Final   Comment:   GEST. AGE      CONC.  (mIU/mL)   <=1 WEEK        5 - 50     2 WEEKS       50 - 500     3 WEEKS       100 - 10,000     4 WEEKS      1,000 - 30,000        FEMALE AND NON-PREGNANT FEMALE:     LESS THAN 5 mIU/mL   . SARS Coronavirus 2 by RT PCR 08/12/2020 NEGATIVE  NEGATIVE Final   Comment: (NOTE) SARS-CoV-2 target nucleic acids are NOT DETECTED.  The SARS-CoV-2 RNA is generally detectable in upper respiratory specimens during the acute phase of infection. The lowest concentration of SARS-CoV-2 viral copies this assay can detect is 138 copies/mL. A negative result does not preclude SARS-Cov-2 infection and should not be used as the sole basis for treatment or other patient management decisions. A negative result may occur with  improper specimen collection/handling, submission of specimen other than nasopharyngeal swab, presence of viral mutation(s) within the areas targeted by this assay, and inadequate number of viral copies(<138 copies/mL). A negative result must be combined with clinical observations, patient history, and epidemiological information. The expected result is Negative.  Fact Sheet for Patients:  BloggerCourse.comhttps://www.fda.gov/media/152166/download  Fact Sheet for Healthcare Providers:  SeriousBroker.ithttps://www.fda.gov/media/152162/download  This test is no                          t yet approved or cleared by the [place] FDA and  has been authorized for detection and/or diagnosis of SARS-CoV-2 by FDA under an Emergency Use Authorization (EUA). This EUA will remain  in effect (meaning this test can be used) for the duration of the COVID-19 declaration under Section 564(b)(1) of the Act, 21 U.S.C.section 360bbb-3(b)(1), unless the authorization is terminated  or revoked sooner.      . Influenza A by PCR 08/12/2020 NEGATIVE  NEGATIVE Final  . Influenza B by PCR 08/12/2020 NEGATIVE  NEGATIVE Final   Comment: (NOTE) The Xpert Xpress SARS-CoV-2/FLU/RSV plus assay is intended as an aid in the diagnosis of influenza from Nasopharyngeal swab specimens and should not be used as a sole basis for treatment. Nasal  washings and aspirates are unacceptable for Xpert Xpress SARS-CoV-2/FLU/RSV testing.  Fact Sheet for Patients: BloggerCourse.comhttps://www.fda.gov/media/152166/download  Fact Sheet for Healthcare Providers: SeriousBroker.ithttps://www.fda.gov/media/152162/download  This test is not yet approved or cleared by the [place] FDA and has been authorized for detection and/or diagnosis of SARS-CoV-2 by FDA under an Emergency Use Authorization (EUA). This EUA will remain in effect (meaning this test can be used) for the duration of the COVID-19 declaration under Section 564(b)(1) of  the Act, 21 U.S.C. section 360bbb-3(b)(1), unless the authorization is terminated or revoked.  Performed at [facility], [place] [address], [place], [place] [address]   . Glucose-Capillary 08/12/2020 157* 70 - 99 mg/dL Final   Glucose reference range applies only to samples taken after fasting for at least 8 hours.  Admission on 07/29/2020, Discharged on 07/30/2020  Component Date Value Ref Range Status  . SARS Coronavirus 2 by RT PCR 07/29/2020 NEGATIVE  NEGATIVE Final   Comment: (NOTE) SARS-CoV-2 target nucleic acids are NOT DETECTED.  The SARS-CoV-2 RNA is generally detectable in upper respiratory specimens during the acute phase of infection. The lowest concentration of SARS-CoV-2 viral copies this assay can detect is 138 copies/mL. A negative result does not preclude SARS-Cov-2 infection and should not be used as the sole basis for treatment or other patient management decisions. A negative result may occur with  improper specimen collection/handling, submission of specimen other than nasopharyngeal swab, presence of viral mutation(s) within the areas targeted by this assay, and inadequate number of viral copies(<138 copies/mL). A negative result must be combined with clinical observations, patient history, and epidemiological information. The expected result is Negative.  Fact Sheet for Patients:   EntrepreneurPulse.com.au  Fact Sheet for Healthcare Providers:  IncredibleEmployment.be  This test is no                          t yet approved or cleared by the [place] FDA and  has been authorized for detection and/or diagnosis of SARS-CoV-2 by FDA under an Emergency Use Authorization (EUA). This EUA will remain  in effect (meaning this test can be used) for the duration of the COVID-19 declaration under Section 564(b)(1) of the Act, 21 U.S.C.section 360bbb-3(b)(1), unless the authorization is terminated  or revoked sooner.      . Influenza A by PCR 07/29/2020 NEGATIVE  NEGATIVE Final  . Influenza B by PCR 07/29/2020 NEGATIVE  NEGATIVE Final   Comment: (NOTE) The Xpert Xpress SARS-CoV-2/FLU/RSV plus assay is intended as an aid in the diagnosis of influenza from Nasopharyngeal swab specimens and should not be used as a sole basis for treatment. Nasal washings and aspirates are unacceptable for Xpert Xpress SARS-CoV-2/FLU/RSV testing.  Fact Sheet for Patients: EntrepreneurPulse.com.au  Fact Sheet for Healthcare Providers: IncredibleEmployment.be  This test is not yet approved or cleared by the [place] FDA and has been authorized for detection and/or diagnosis of SARS-CoV-2 by FDA under an Emergency Use Authorization (EUA). This EUA will remain in effect (meaning this test can be used) for the duration of the COVID-19 declaration under Section 564(b)(1) of the Act, 21 U.S.C. section 360bbb-3(b)(1), unless the authorization is terminated or revoked.  Performed at [facility], [place] [address] [place], [place] [address]   . Sodium 07/29/2020 140  135 - 145 mmol/L Final  . Potassium 07/29/2020 3.9  3.5 - 5.1 mmol/L Final  . Chloride 07/29/2020 108  98 - 111 mmol/L Final  . CO2 07/29/2020 26  22 - 32 mmol/L Final  . Glucose, Bld 07/29/2020 167* 70 - 99 mg/dL Final    Glucose reference range applies only to samples taken after fasting for at least 8 hours.  . BUN 07/29/2020 10  6 - 20 mg/dL Final  . Creatinine, Ser 07/29/2020 0.79  0.44 - 1.00 mg/dL Final  . Calcium 07/29/2020 9.2  8.9 - 10.3 mg/dL Final  . Total Protein 07/29/2020 7.2  6.5 - 8.1  g/dL Final  . Albumin 16/01/9603 4.0  3.5 - 5.0 g/dL Final  . AST 54/12/8117 23  15 - 41 U/L Final  . ALT 07/29/2020 19  0 - 44 U/L Final  . Alkaline Phosphatase 07/29/2020 51  38 - 126 U/L Final  . Total Bilirubin 07/29/2020 0.5  0.3 - 1.2 mg/dL Final  . GFR, Estimated 07/29/2020 >60  >60 mL/min Final   Comment: (NOTE) Calculated using the CKD-EPI Creatinine Equation (2021)   . Anion gap 07/29/2020 6  5 - 15 Final   Performed at [facility], [address] [place], [place] [address]  . Alcohol, Ethyl (B) 07/29/2020 <10  <10 mg/dL Final   Comment: (NOTE) Lowest detectable limit for serum alcohol is 10 mg/dL.  For medical purposes only. Performed at [facility], [address] [place], [place] [address]   . Opiates 07/29/2020 NONE DETECTED  NONE DETECTED Final  . Cocaine 07/29/2020 NONE DETECTED  NONE DETECTED Final  . Benzodiazepines 07/29/2020 NONE DETECTED  NONE DETECTED Final  . Amphetamines 07/29/2020 NONE DETECTED  NONE DETECTED Final  . Tetrahydrocannabinol 07/29/2020 NONE DETECTED  NONE DETECTED Final  . Barbiturates 07/29/2020 POSITIVE* NONE DETECTED Final   Comment: (NOTE) DRUG SCREEN FOR MEDICAL PURPOSES ONLY.  IF CONFIRMATION IS NEEDED FOR ANY PURPOSE, NOTIFY LAB WITHIN 5 DAYS.  LOWEST DETECTABLE LIMITS FOR URINE DRUG SCREEN Drug Class                     Cutoff (ng/mL) Amphetamine and metabolites    1000 Barbiturate and metabolites    200 Benzodiazepine                 200 Tricyclics and metabolites     300 Opiates and metabolites        300 Cocaine and metabolites        300 THC                            50 Performed at [facility], [address], [place], [place] [address]   . WBC 07/29/2020 8.7  4.0 - 10.5 K/uL Final  . RBC 07/29/2020 4.31  3.87 - 5.11 MIL/uL Final  . Hemoglobin 07/29/2020 10.5* 12.0 - 15.0 g/dL Final  . HCT 78/46/9629 33.6* 36.0 - 46.0 % Final  . MCV 07/29/2020 78.0* 80.0 - 100.0 fL Final  . MCH 07/29/2020 24.4* 26.0 - 34.0 pg Final  . MCHC 07/29/2020 31.3  30.0 - 36.0 g/dL Final  . RDW 52/84/1324 15.0  11.5 - 15.5 % Final  . Platelets 07/29/2020 260  150 - 400 K/uL Final  . nRBC 07/29/2020 0.0  0.0 - 0.2 % Final  . Neutrophils Relative % 07/29/2020 45  % Final  . Neutro Abs 07/29/2020 3.9  1.7 - 7.7 K/uL Final  . Lymphocytes Relative 07/29/2020 44  % Final  . Lymphs Abs 07/29/2020 3.8  0.7 - 4.0 K/uL Final  . Monocytes Relative 07/29/2020 6  % Final  . Monocytes Absolute 07/29/2020 0.5  0.1 - 1.0 K/uL Final  . Eosinophils Relative 07/29/2020 4  % Final  . Eosinophils Absolute 07/29/2020 0.3  0.0 - 0.5 K/uL Final  . Basophils Relative 07/29/2020 1  % Final  . Basophils Absolute 07/29/2020 0.1  0.0 - 0.1 K/uL Final  . Immature Granulocytes 07/29/2020 0  % Final  . Abs Immature Granulocytes 07/29/2020 0.02  0.00 -  0.07 K/uL Final   Performed at [facility], [place] [address] [place], [place] [address]  . I-stat hCG, quantitative 07/29/2020 <5.0  <5 mIU/mL Final  . Comment 3 07/29/2020          Final   Comment:   GEST. AGE      CONC.  (mIU/mL)   <=1 WEEK        5 - 50     2 WEEKS       50 - 500     3 WEEKS       100 - 10,000     4 WEEKS     1,000 - 30,000        FEMALE AND NON-PREGNANT FEMALE:     LESS THAN 5 mIU/mL   Admission on 07/23/2020, Discharged on 07/24/2020  Component Date Value Ref Range Status  . Glucose-Capillary 07/23/2020 100* 70 - 99 mg/dL Final   Glucose reference range applies only to samples taken after fasting for at least 8 hours.  Marland Kitchen SARS Coronavirus 2 by RT PCR 07/23/2020 NEGATIVE  NEGATIVE Final   Comment: (NOTE) SARS-CoV-2  target nucleic acids are NOT DETECTED.  The SARS-CoV-2 RNA is generally detectable in upper respiratory specimens during the acute phase of infection. The lowest concentration of SARS-CoV-2 viral copies this assay can detect is 138 copies/mL. A negative result does not preclude SARS-Cov-2 infection and should not be used as the sole basis for treatment or other patient management decisions. A negative result may occur with  improper specimen collection/handling, submission of specimen other than nasopharyngeal swab, presence of viral mutation(s) within the areas targeted by this assay, and inadequate number of viral copies(<138 copies/mL). A negative result must be combined with clinical observations, patient history, and epidemiological information. The expected result is Negative.  Fact Sheet for Patients:  EntrepreneurPulse.com.au  Fact Sheet for Healthcare Providers:  IncredibleEmployment.be  This test is no                          t yet approved or cleared by the [place] FDA and  has been authorized for detection and/or diagnosis of SARS-CoV-2 by FDA under an Emergency Use Authorization (EUA). This EUA will remain  in effect (meaning this test can be used) for the duration of the COVID-19 declaration under Section 564(b)(1) of the Act, 21 U.S.C.section 360bbb-3(b)(1), unless the authorization is terminated  or revoked sooner.      . Influenza A by PCR 07/23/2020 NEGATIVE  NEGATIVE Final  . Influenza B by PCR 07/23/2020 NEGATIVE  NEGATIVE Final   Comment: (NOTE) The Xpert Xpress SARS-CoV-2/FLU/RSV plus assay is intended as an aid in the diagnosis of influenza from Nasopharyngeal swab specimens and should not be used as a sole basis for treatment. Nasal washings and aspirates are unacceptable for Xpert Xpress SARS-CoV-2/FLU/RSV testing.  Fact Sheet for Patients: EntrepreneurPulse.com.au  Fact Sheet for  Healthcare Providers: IncredibleEmployment.be  This test is not yet approved or cleared by the [place] FDA and has been authorized for detection and/or diagnosis of SARS-CoV-2 by FDA under an Emergency Use Authorization (EUA). This EUA will remain in effect (meaning this test can be used) for the duration of the COVID-19 declaration under Section 564(b)(1) of the Act, 21 U.S.C. section 360bbb-3(b)(1), unless the authorization is terminated or revoked.  Performed at [facility], [place] [address], [place], [place] [address]   . Sodium 07/23/2020 139  135 -  145 mmol/L Final  . Potassium 07/23/2020 3.9  3.5 - 5.1 mmol/L Final  . Chloride 07/23/2020 107  98 - 111 mmol/L Final  . CO2 07/23/2020 24  22 - 32 mmol/L Final  . Glucose, Bld 07/23/2020 88  70 - 99 mg/dL Final   Glucose reference range applies only to samples taken after fasting for at least 8 hours.  . BUN 07/23/2020 9  6 - 20 mg/dL Final  . Creatinine, Ser 07/23/2020 0.81  0.44 - 1.00 mg/dL Final  . Calcium 03/00/9233 9.2  8.9 - 10.3 mg/dL Final  . Total Protein 07/23/2020 7.6  6.5 - 8.1 g/dL Final  . Albumin 00/76/2263 3.9  3.5 - 5.0 g/dL Final  . AST 33/54/5625 19  15 - 41 U/L Final  . ALT 07/23/2020 16  0 - 44 U/L Final  . Alkaline Phosphatase 07/23/2020 50  38 - 126 U/L Final  . Total Bilirubin 07/23/2020 0.2* 0.3 - 1.2 mg/dL Final  . GFR, Estimated 07/23/2020 >60  >60 mL/min Final   Comment: (NOTE) Calculated using the CKD-EPI Creatinine Equation (2021)   . Anion gap 07/23/2020 8  5 - 15 Final   Performed at [facility], [address], [place], [place] [address]  . Alcohol, Ethyl (B) 07/23/2020 <10  <10 mg/dL Final   Comment: (NOTE) Lowest detectable limit for serum alcohol is 10 mg/dL.  For medical purposes only. Performed at [facility], [address] [place], [place] [address]   . Opiates 07/23/2020 NONE DETECTED  NONE DETECTED  Final  . Cocaine 07/23/2020 NONE DETECTED  NONE DETECTED Final  . Benzodiazepines 07/23/2020 NONE DETECTED  NONE DETECTED Final  . Amphetamines 07/23/2020 NONE DETECTED  NONE DETECTED Final  . Tetrahydrocannabinol 07/23/2020 NONE DETECTED  NONE DETECTED Final  . Barbiturates 07/23/2020 POSITIVE* NONE DETECTED Final   Comment: (NOTE) DRUG SCREEN FOR MEDICAL PURPOSES ONLY.  IF CONFIRMATION IS NEEDED FOR ANY PURPOSE, NOTIFY LAB WITHIN 5 DAYS.  LOWEST DETECTABLE LIMITS FOR URINE DRUG SCREEN Drug Class                     Cutoff (ng/mL) Amphetamine and metabolites    1000 Barbiturate and metabolites    200 Benzodiazepine                 200 Tricyclics and metabolites     300 Opiates and metabolites        300 Cocaine and metabolites        300 THC                            50 Performed at [facility], [address] [place], [place] [address]   . WBC 07/23/2020 11.0* 4.0 - 10.5 K/uL Final  . RBC 07/23/2020 4.72  3.87 - 5.11 MIL/uL Final  . Hemoglobin 07/23/2020 11.5* 12.0 - 15.0 g/dL Final  . HCT 57/26/2035 36.7  36.0 - 46.0 % Final  . MCV 07/23/2020 77.8* 80.0 - 100.0 fL Final  . MCH 07/23/2020 24.4* 26.0 - 34.0 pg Final  . MCHC 07/23/2020 31.3  30.0 - 36.0 g/dL Final  . RDW 59/74/1638 14.9  11.5 - 15.5 % Final  . Platelets 07/23/2020 282  150 - 400 K/uL Final  . nRBC 07/23/2020 0.0  0.0 - 0.2 % Final  . Neutrophils Relative % 07/23/2020 61  % Final  . Neutro Abs 07/23/2020 6.7  1.7 - 7.7 K/uL Final  . Lymphocytes Relative 07/23/2020 29  % Final  . Lymphs Abs 07/23/2020 3.2  0.7 - 4.0 K/uL Final  . Monocytes Relative 07/23/2020 5  % Final  . Monocytes Absolute 07/23/2020 0.6  0.1 - 1.0 K/uL Final  . Eosinophils Relative 07/23/2020 4  % Final  . Eosinophils Absolute 07/23/2020 0.4  0.0 - 0.5 K/uL Final  . Basophils Relative 07/23/2020 1  % Final  . Basophils Absolute 07/23/2020 0.1  0.0 - 0.1 K/uL Final  . Immature Granulocytes 07/23/2020 0  % Final  .  Abs Immature Granulocytes 07/23/2020 0.03  0.00 - 0.07 K/uL Final   Performed at [facility], [address] [place], [place] [address]  . I-stat hCG, quantitative 07/23/2020 <5.0  <5 mIU/mL Final  . Comment 3 07/23/2020          Final   Comment:   GEST. AGE      CONC.  (mIU/mL)   <=1 WEEK        5 - 50     2 WEEKS       50 - 500     3 WEEKS       100 - 10,000     4 WEEKS     1,000 - 30,000        FEMALE AND NON-PREGNANT FEMALE:     LESS THAN 5 mIU/mL   . Lithium Lvl 07/23/2020 0.42* 0.60 - 1.20 mmol/L Final   Performed at [facility], [address] [place], [place] [address]  . Glucose-Capillary 07/24/2020 143* 70 - 99 mg/dL Final   Glucose reference range applies only to samples taken after fasting for at least 8 hours.  Clinical Support on 07/19/2020  Component Date Value Ref Range Status  . Candida Vaginitis 07/19/2020 Positive*  Final  . Candida Glabrata 07/19/2020 Negative   Final  . Comment 07/19/2020 Normal Reference Range Candida Species - Negative   Final  . Comment 07/19/2020 Normal Reference Range Candida Galbrata - Negative   Final  Clinical Support on 07/05/2020  Component Date Value Ref Range Status  . HIV Screen 4th Generation wRfx 07/05/2020 Non Reactive  Non Reactive Final   Comment: HIV Negative HIV-1/HIV-2 antibodies and HIV-1 p24 antigen were NOT detected. There is no laboratory evidence of HIV infection.   . RPR Ser Ql 07/05/2020 Reactive* Non Reactive Final  . Hepatitis B Surface Ag 07/05/2020 Negative  Negative Final  . Hep C Virus Ab 07/05/2020 <0.1  0.0 - 0.9 s/co ratio Final   Comment:                                   Negative:     < 0.8                              Indeterminate: 0.8 - 0.9                                   Positive:     > 0.9  The CDC recommends that a positive HCV antibody result  be followed up with a HCV Nucleic Acid Amplification  test (098119).   . Neisseria Gonorrhea 07/05/2020  Negative   Final  . Chlamydia 07/05/2020 Negative   Final  .  Trichomonas 07/05/2020 Negative   Final  . Bacterial Vaginitis (gardnerella) 07/05/2020 Positive*  Final  . Candida Vaginitis 07/05/2020 Positive*  Final  . Candida Glabrata 07/05/2020 Negative   Final  . Comment 07/05/2020 Normal Reference Range Bacterial Vaginosis - Negative   Final  . Comment 07/05/2020 Normal Reference Range Candida Species - Negative   Final  . Comment 07/05/2020 Normal Reference Range Candida Galbrata - Negative   Final  . Comment 07/05/2020 Normal Reference Range Trichomonas - Negative   Final  . Comment 07/05/2020 Normal Reference Ranger Chlamydia - Negative   Final  . Comment 07/05/2020 Normal Reference Range Neisseria Gonorrhea - Negative   Final  . Rapid Plasma Reagin, Quant 07/05/2020 1:16* NonRea<1:1 Final  . T Pallidum Abs 07/05/2020 Reactive* Non Reactive Final  Admission on 07/03/2020, Discharged on 07/03/2020  Component Date Value Ref Range Status  . Glucose-Capillary 07/03/2020 362* 70 - 99 mg/dL Final   Glucose reference range applies only to samples taken after fasting for at least 8 hours.  Admission on 06/27/2020, Discharged on 06/27/2020  Component Date Value Ref Range Status  . Color, Urine 06/27/2020 YELLOW  YELLOW Final  . APPearance 06/27/2020 CLOUDY* CLEAR Final  . Specific Gravity, Urine 06/27/2020 1.024  1.005 - 1.030 Final  . pH 06/27/2020 5.0  5.0 - 8.0 Final  . Glucose, UA 06/27/2020 >=500* NEGATIVE mg/dL Final  . Hgb urine dipstick 06/27/2020 SMALL* NEGATIVE Final  . Bilirubin Urine 06/27/2020 NEGATIVE  NEGATIVE Final  . Ketones, ur 06/27/2020 NEGATIVE  NEGATIVE mg/dL Final  . Protein, ur 76/73/4193 30* NEGATIVE mg/dL Final  . Nitrite 79/05/4095 NEGATIVE  NEGATIVE Final  . Glori Luis 06/27/2020 SMALL* NEGATIVE Final  . RBC / HPF 06/27/2020 0-5  0 - 5 RBC/hpf Final  . WBC, UA 06/27/2020 6-10  0 - 5 WBC/hpf Final  . Bacteria, UA 06/27/2020 RARE* NONE SEEN Final  .  Squamous Epithelial / LPF 06/27/2020 0-5  0 - 5 Final  . Mucus 06/27/2020 PRESENT   Final   Performed at [facility], [address], [place], [place] [address]  . Preg Test, Ur 06/27/2020 NEGATIVE  NEGATIVE Final   Comment:        THE SENSITIVITY OF THIS METHODOLOGY IS >20 mIU/mL. Performed at [facility], [address] [place], [place] [address]   . Neisseria Gonorrhea 06/27/2020 Negative   Final  . Chlamydia 06/27/2020 Negative   Final  . Comment 06/27/2020 Normal Reference Ranger Chlamydia - Negative   Final  . Comment 06/27/2020 Normal Reference Range Neisseria Gonorrhea - Negative   Final  . Yeast Wet Prep HPF POC 06/27/2020 NONE SEEN  NONE SEEN Final  . Trich, Wet Prep 06/27/2020 NONE SEEN  NONE SEEN Final  . Clue Cells Wet Prep HPF POC 06/27/2020 NONE SEEN  NONE SEEN Final  . WBC, Wet Prep HPF POC 06/27/2020 MANY* NONE SEEN Final  . Sperm 06/27/2020 NONE SEEN   Final   Performed at [facility], [address], [place], [place] [address]  Admission on 06/04/2020, Discharged on 06/05/2020  Component Date Value Ref Range Status  . Sodium 06/04/2020 140  135 - 145 mmol/L Final  . Potassium 06/04/2020 3.6  3.5 - 5.1 mmol/L Final  . Chloride 06/04/2020 105  98 - 111 mmol/L Final  . CO2 06/04/2020 24  22 - 32 mmol/L Final  . Glucose, Bld 06/04/2020 89  70 - 99 mg/dL Final   Glucose reference range applies only to  samples taken after fasting for at least 8 hours.  . BUN 06/04/2020 7  6 - 20 mg/dL Final  . Creatinine, Ser 06/04/2020 0.80  0.44 - 1.00 mg/dL Final  . Calcium 06/04/2020 9.4  8.9 - 10.3 mg/dL Final  . Total Protein 06/04/2020 7.3  6.5 - 8.1 g/dL Final  . Albumin 06/04/2020 4.0  3.5 - 5.0 g/dL Final  . AST 06/04/2020 30  15 - 41 U/L Final  . ALT 06/04/2020 26  0 - 44 U/L Final  . Alkaline Phosphatase 06/04/2020 41  38 - 126 U/L Final  . Total Bilirubin 06/04/2020 0.9  0.3 - 1.2 mg/dL Final  . GFR, Estimated  06/04/2020 >60  >60 mL/min Final   Comment: (NOTE) Calculated using the CKD-EPI Creatinine Equation (2021)   . Anion gap 06/04/2020 11  5 - 15 Final   Performed at [facility] [address] [place], [place] [address]  . Alcohol, Ethyl (B) 06/04/2020 <10  <10 mg/dL Final   Comment: (NOTE) Lowest detectable limit for serum alcohol is 10 mg/dL.  For medical purposes only. Performed at [facility], [place] [address] [place], [place] [address]   . Salicylate Lvl 43/32/9518 <7.0* 7.0 - 30.0 mg/dL Final   Performed at [facility] [address], [place], [place] [address]  . Acetaminophen (Tylenol), Serum 06/04/2020 <10* 10 - 30 ug/mL Final   Comment: (NOTE) Therapeutic concentrations vary significantly. A range of 10-30 ug/mL  may be an effective concentration for many patients. However, some  are best treated at concentrations outside of this range. Acetaminophen concentrations >150 ug/mL at 4 hours after ingestion  and >50 ug/mL at 12 hours after ingestion are often associated with  toxic reactions.  Performed at [facility], [place] [address] [place], [place] [address]   . WBC 06/04/2020 12.0* 4.0 - 10.5 K/uL Final  . RBC 06/04/2020 4.87  3.87 - 5.11 MIL/uL Final  . Hemoglobin 06/04/2020 12.2  12.0 - 15.0 g/dL Final  . HCT 06/04/2020 37.8  36.0 - 46.0 % Final  . MCV 06/04/2020 77.6* 80.0 - 100.0 fL Final  . MCH 06/04/2020 25.1* 26.0 - 34.0 pg Final  . MCHC 06/04/2020 32.3  30.0 - 36.0 g/dL Final  . RDW 06/04/2020 16.1* 11.5 - 15.5 % Final  . Platelets 06/04/2020 296  150 - 400 K/uL Final  . nRBC 06/04/2020 0.0  0.0 - 0.2 % Final   Performed at [facility] [address] [place], [place] [address]  . I-stat hCG, quantitative 06/04/2020 <5.0  <5 mIU/mL Final  . Comment 3 06/04/2020          Final   Comment:   GEST. AGE      CONC.  (mIU/mL)   <=1 WEEK        5 - 50     2 WEEKS       50 - 500     3 WEEKS       100 - 10,000     4 WEEKS      1,000 - 30,000        FEMALE AND NON-PREGNANT FEMALE:     LESS THAN 5 mIU/mL   . Glucose-Capillary 06/05/2020 139* 70 - 99 mg/dL Final   Glucose reference range applies only to samples taken after fasting for at least 8 hours.  Admission on 06/04/2020, Discharged on 06/04/2020  Component Date Value Ref Range Status  . SARS  Coronavirus 2 by RT PCR 06/04/2020 POSITIVE* NEGATIVE Final   Comment: RESULT CALLED TO, READ BACK BY AND VERIFIED WITH: J THOMPKINS RN [id number] 06/04/20 A BROWNING (NOTE) SARS-CoV-2 target nucleic acids are DETECTED.  The SARS-CoV-2 RNA is generally detectable in upper respiratory specimens during the acute phase of infection. Positive results are indicative of the presence of the identified virus, but do not rule out bacterial infection or co-infection with other pathogens not detected by the test. Clinical correlation with patient history and other diagnostic information is necessary to determine patient infection status. The expected result is Negative.  Fact Sheet for Patients: BloggerCourse.com  Fact Sheet for Healthcare Providers: SeriousBroker.it  This test is not yet approved or cleared by the [place] FDA and  has been authorized for detection and/or diagnosis of SARS-CoV-2 by FDA under an Emergency Use Authorization (EUA).  This EUA will remain in effect (meaning this test ca                          n be used) for the duration of  the COVID-19 declaration under Section 564(b)(1) of the Act, 21 U.S.C. section 360bbb-3(b)(1), unless the authorization is terminated or revoked sooner.    . Influenza A by PCR 06/04/2020 NEGATIVE  NEGATIVE Final  . Influenza B by PCR 06/04/2020 NEGATIVE  NEGATIVE Final   Comment: (NOTE) The Xpert Xpress SARS-CoV-2/FLU/RSV plus assay is intended as an aid in the diagnosis of influenza from Nasopharyngeal swab specimens and should not be used as a sole basis for  treatment. Nasal washings and aspirates are unacceptable for Xpert Xpress SARS-CoV-2/FLU/RSV testing.  Fact Sheet for Patients: BloggerCourse.com  Fact Sheet for Healthcare Providers: SeriousBroker.it  This test is not yet approved or cleared by the [place] FDA and has been authorized for detection and/or diagnosis of SARS-CoV-2 by FDA under an Emergency Use Authorization (EUA). This EUA will remain in effect (meaning this test can be used) for the duration of the COVID-19 declaration under Section 564(b)(1) of the Act, 21 U.S.C. section 360bbb-3(b)(1), unless the authorization is terminated or revoked.  Performed at [facility], [address] [place], [place] [address]   . POC Amphetamine UR 06/04/2020 None Detected  NONE DETECTED (Cut Off Level 1000 ng/mL) Final  . POC Secobarbital (BAR) 06/04/2020 None Detected  NONE DETECTED (Cut Off Level 300 ng/mL) Final  . POC Buprenorphine (BUP) 06/04/2020 None Detected  NONE DETECTED (Cut Off Level 10 ng/mL) Final  . POC Oxazepam (BZO) 06/04/2020 None Detected  NONE DETECTED (Cut Off Level 300 ng/mL) Final  . POC Cocaine UR 06/04/2020 None Detected  NONE DETECTED (Cut Off Level 300 ng/mL) Final  . POC Methamphetamine UR 06/04/2020 None Detected  NONE DETECTED (Cut Off Level 1000 ng/mL) Final  . POC Morphine 06/04/2020 None Detected  NONE DETECTED (Cut Off Level 300 ng/mL) Final  . POC Oxycodone UR 06/04/2020 None Detected  NONE DETECTED (Cut Off Level 100 ng/mL) Final  . POC Methadone UR 06/04/2020 None Detected  NONE DETECTED (Cut Off Level 300 ng/mL) Final  . POC Marijuana UR 06/04/2020 None Detected  NONE DETECTED (Cut Off Level 50 ng/mL) Final  . SARS Coronavirus 2 Ag 06/04/2020 Negative  Negative Final  There may be more visits with results that are not included.    Allergies: Wellbutrin [bupropion], Omnipaque [iohexol], Penicillins, and Penicillin g  PTA Medications:  (Not in a hospital admission)   Medical Decision Making  Labs and  covid ordered Rstarted home medications Start Depakote DR 500 mg PO BID    Recommendations  Based on my evaluation the patient does not appear to have an emergency medical condition.  Gerlene Burdock [place], FNP 08/26/20  3:19 PM
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
## 2020-08-27 LAB — POCT URINE DRUG SCREEN - MANUAL ENTRY (I-SCREEN)
POC Amphetamine UR: NOT DETECTED
POC Buprenorphine (BUP): POSITIVE — AB
POC Cocaine UR: NOT DETECTED
POC Marijuana UR: POSITIVE — AB
POC Methadone UR: NOT DETECTED
POC Methamphetamine UR: NOT DETECTED
POC Morphine: NOT DETECTED
POC Oxazepam (BZO): NOT DETECTED
POC Oxycodone UR: NOT DETECTED
POC Secobarbital (BAR): NOT DETECTED

## 2020-08-27 LAB — POCT PREGNANCY, URINE: Preg Test, Ur: NEGATIVE

## 2020-08-27 LAB — GLUCOSE, CAPILLARY
Glucose-Capillary: 214 mg/dL — ABNORMAL HIGH (ref 70–99)
Glucose-Capillary: 120 mg/dL — ABNORMAL HIGH (ref 70–99)

## 2020-08-27 MED ORDER — DIVALPROEX SODIUM 500 MG PO DR TAB: 500.0000 mg | DELAYED_RELEASE_TABLET | Freq: Three times a day (TID) | ORAL | Status: DC

## 2020-08-27 MED ORDER — DIVALPROEX SODIUM 500 MG PO DR TAB
500.0000 mg | DELAYED_RELEASE_TABLET | Freq: Three times a day (TID) | ORAL | Status: DC
  Filled 2020-08-27: qty 6

## 2020-08-27 NOTE — Progress Notes (Signed)
Pt is sleeping. Respirations are even and unlabored. No signs of acute distress noted. Staff will monitor for pt's safety. ?

## 2020-08-27 NOTE — ED Notes (Signed)
Pt was given orange juice and nutri grain bars (2).

## 2020-08-27 NOTE — ED Notes (Signed)
Patient rousing to request food.  BG 214.  Patient stated her "mind still isn't right".  Not given HS medications due to sedation.  Per eMAR, patient given at approx. Levemir at 1555. Per Ene, NP, to give HS medications, including Levemir now.

## 2020-08-27 NOTE — Discharge Instructions (Signed)

## 2020-08-27 NOTE — ED Notes (Signed)
Patient sleeping

## 2020-08-27 NOTE — ED Notes (Signed)
Patient ate two granola bars and drank 2 orange juices
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
## 2020-08-27 NOTE — Discharge Summary (Addendum)
[name] to be D/C'd  Home per NP order. Discussed with the patient and all questions fully answered. An After Visit Summary was printed and given to the patient. Medication sample also given to pt. Patient escorted out and D/C home via safe transport. [name]  08/27/2020 11:38 AM

## 2020-09-03 ENCOUNTER — Ambulatory Visit (HOSPITAL_COMMUNITY)
Admission: EM | Admit: 2020-09-03 | Discharge: 2020-09-03 | Disposition: A | Discharge status: Home / Self Care | Payer: Medicaid Other | Attending: Psychiatry | Admitting: Psychiatry

## 2020-09-03 ENCOUNTER — Other Ambulatory Visit: Payer: Self-pay

## 2020-09-03 DIAGNOSIS — Z765 Malingerer [conscious simulation]: Secondary | ICD-10-CM | POA: Diagnosis not present

## 2020-09-03 DIAGNOSIS — F4325 Adjustment disorder with mixed disturbance of emotions and conduct: Secondary | ICD-10-CM | POA: Diagnosis not present

## 2020-09-03 DIAGNOSIS — R45851 Suicidal ideations: Secondary | ICD-10-CM | POA: Insufficient documentation
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
## 2020-09-03 NOTE — Discharge Instructions (Addendum)
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
## 2020-09-03 NOTE — ED Notes (Signed)
Pt discharged with resources in hand and recommendations for follow up care. Safety maintained.
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
## 2020-09-07 ENCOUNTER — Ambulatory Visit (HOSPITAL_COMMUNITY)
Admission: EM | Admit: 2020-09-07 | Discharge: 2020-09-07 | Disposition: A | Discharge status: Home / Self Care | Payer: Medicaid Other

## 2020-09-07 ENCOUNTER — Encounter (HOSPITAL_COMMUNITY): Payer: Self-pay

## 2020-09-07 ENCOUNTER — Other Ambulatory Visit: Payer: Self-pay

## 2020-09-07 DIAGNOSIS — R112 Nausea with vomiting, unspecified: Secondary | ICD-10-CM

## 2020-09-07 LAB — CBG MONITORING, ED: Glucose-Capillary: 84 mg/dL (ref 70–99)

## 2020-09-07 LAB — POC URINE PREG, ED: Preg Test, Ur: NEGATIVE

## 2020-09-07 MED ORDER — ONDANSETRON 4 MG PO TBDP: 4.0000 mg | ORAL_TABLET | Freq: Once | ORAL | Status: DC

## 2020-09-07 MED ORDER — ONDANSETRON HCL 4 MG PO TABS: 4.0000 mg | ORAL_TABLET | Freq: Four times a day (QID) | ORAL | 0 refills | Status: DC

## 2020-09-07 NOTE — Discharge Instructions (Signed)
Drink plenty of water Get plenty of rest Follow up with PCP in 2 - 3 days, go to ED with ANY worsening symptoms.

## 2020-09-07 NOTE — ED Triage Notes (Addendum)
Pt in with c/o vomiting and fatigue that started this morning   Pt states she has abdominal pain after intercourse  Pt requesting pregnancy test and to have her blood sugar checked  States she's a diabetic and has not been checking her sugar at home

## 2020-09-07 NOTE — ED Provider Notes (Signed)
[facility]    CSN: [id number] Arrival date & time: 09/07/20  1412      History   Chief Complaint Chief Complaint  Patient presents with  . Emesis  . Fatigue    HPI [name] is a 30 y.o. female.   Patient here c/w vomiting, notes vomiting twice today.  She is requesting pregnancy test and blood sugar check.  She has DM2, does not monitor her blood sugar.  Denies f/c, URI sx, cough, wheezing, SOB, diarrhea, abdominal pain.       Past Medical History:  Diagnosis Date  . Anxiety   . Bipolar 1 disorder ([place])   . Cognitive deficits   . Depression   . Diabetes mellitus without complication ([place])   . Hypertension   . Mental disorder   . Mental health disorder   . Obesity     Patient Active Problem List   Diagnosis Date Noted  . Syphilis 07/15/2020  . Malingering 06/05/2020  . Gastroesophageal reflux disease 05/04/2020  . Hyperglycemia due to type 2 diabetes mellitus ([place]) 05/04/2020  . Long term (current) use of insulin ([place]) 05/04/2020  . Migraine without aura 05/04/2020  . Morbid obesity ([place]) 05/04/2020  . 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 2 diabetes mellitus ([place]) 05/04/2020  . Prolapsed internal hemorrhoids 05/04/2020  . Vitamin D deficiency 05/04/2020  . Other symptoms and signs involving cognitive functions and awareness 05/04/2020  . Suicide attempt ([place])   . Anxiety state 03/06/2020  . Schizophrenia ([place]) 09/13/2019  . Bipolar I disorder, most recent episode depressed ([place]) 06/23/2019  . MDD (major depressive disorder) 10/10/2018  . Schizoaffective disorder, bipolar type ([place]) 09/25/2018  . Affective psychosis, bipolar ([place]) 06/13/2018  . HTN (hypertension) 05/03/2018  . Tobacco use disorder 05/03/2018  . Adjustment disorder with emotional disturbance 01/02/2018  . 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 Moderate bipolar I disorder, most recent episode depressed ([place])   . Psychosis ([place])   . Adjustment disorder with mixed  disturbance of emotions and conduct 08/03/2017  . 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 OCD (obsessive compulsive disorder) 10/05/2016  . Major depressive disorder, recurrent episode, mild ([place]) 05/04/2016  . 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 Learning disability 07/18/2015  . 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 Diabetes mellitus ([place]) 07/18/2015  . 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 Hyperlipidemia 07/18/2015  . Severe episode of recurrent major depressive disorder, without psychotic features ([place])   . Suicidal ideation   . Drug overdose   . Cognitive deficits 10/12/2012  . Generalized anxiety disorder 06/28/2012    Past Surgical History:  Procedure Laterality Date  . CESAREAN SECTION    . CESAREAN SECTION N/A 04/25/2013   Procedure: REPEAT CESAREAN SECTION;  Surgeon: [name], MD;  Location: [facility];  Service: Obstetrics;  Laterality: N/A;  . MASS EXCISION N/A 06/03/2012   Procedure: EXCISION MASS;  Surgeon: [name], MD;  Location: [facility];  Service: ENT;  Laterality: N/A;  Excision uvula mass  . TONSILLECTOMY N/A 06/03/2012   Procedure: TONSILLECTOMY;  Surgeon: [name], MD;  Location: [facility];  Service: ENT;  Laterality: N/A;  . TONSILLECTOMY      OB History    Gravida  3   Para  3   Term  3   Preterm  0   AB  0   Living  3     SAB  0   IAB  0   Ectopic  0   Multiple      Live Births  3            Home Medications    Prior to Admission medications   Medication Sig Start Date End Date Taking? Authorizing Provider  ondansetron (ZOFRAN) 4 MG tablet Take 1 tablet (4 mg total) by mouth every 6 (six) hours. 09/07/20  Yes Peri Jefferson, PA-C  amLODipine (NORVASC) 5 MG tablet Take 5 mg by mouth daily.    [provider]  benztropine (COGENTIN) 1 MG tablet Take 1 mg by mouth 2 (two) times daily. Patient not taking: No sig reported  05/01/20   [provider]  cholecalciferol (VITAMIN D) 25 MCG (1000 UNIT) tablet Take 1,000 Units by mouth daily. 04/30/20   [provider]  DEPAKOTE ER 500 MG 24 hr tablet Take 500 mg by mouth 3 (three) times daily. 08/28/20   [provider]  divalproex (DEPAKOTE) 500 MG DR tablet Take 1 tablet (500 mg total) by mouth 3 (three) times daily. 08/27/20   Money, Lowry Ram, FNP  haloperidol (HALDOL) 10 MG tablet Take 10 mg by mouth at bedtime. 05/09/20   [provider]  haloperidol decanoate (HALDOL DECANOATE) 50 MG/ML injection Inject 50 mg into the muscle every 28 (twenty-eight) days. 03/19/20   [provider]  hydrOXYzine (ATARAX/VISTARIL) 25 MG tablet Take 1 tablet (25 mg total) by mouth 3 (three) times daily as needed for anxiety. 05/27/20   Derrill Center, NP  insulin detemir (LEVEMIR) 100 UNIT/ML injection Inject 0.4 mLs (40 Units total) into the skin 2 (two) times daily. 05/05/20   Montine Circle, PA-C  propranolol (INDERAL) 10 MG tablet Take 1 tablet (10 mg total) by mouth 2 (two) times daily. 05/05/20 06/04/20  Montine Circle, PA-C  vitamin B-12 (CYANOCOBALAMIN) 1000 MCG tablet Take 1,000 mcg by mouth daily. 11/13/19   [provider]    Family History Family History  Problem Relation Age of Onset  . Hypertension Mother   . Diabetes Father     Social History Social History   Tobacco Use  . Smoking status: Current Every Day Smoker    Packs/day: 4.00    Years: 11.00    Pack years: 44.00    Types: Cigarettes, Cigars  . Smokeless tobacco: Never Used  . Tobacco comment: Pt declined  Vaping Use  . Vaping Use: Never used  Substance Use Topics  . Alcohol use: Not Currently    Comment: occ: last intake 4 mts ago  . Drug use: Not Currently    Types: "Crack" cocaine, Other-see comments    Comment: Patient reports hx of smoking Crack     Allergies   Wellbutrin [bupropion], Omnipaque [iohexol], Penicillins, and Penicillin g   Review  of Systems Review of Systems  Constitutional: Negative for chills, fatigue and fever.  HENT: Negative for congestion, ear pain, nosebleeds, postnasal drip, rhinorrhea, sinus pressure, sinus pain and sore throat.   Respiratory: Negative for cough, shortness of breath and wheezing.   Gastrointestinal: Positive for nausea and vomiting. Negative for abdominal pain and diarrhea.  Musculoskeletal: Negative for arthralgias and myalgias.  Skin: Negative for rash.  Neurological: Negative for light-headedness and headaches.  Hematological: Negative for adenopathy. Does not bruise/bleed easily.  Psychiatric/Behavioral: Negative for confusion and sleep disturbance.     Physical Exam Triage Vital Signs ED Triage Vitals  Enc Vitals Group     BP 09/07/20 1520 (!) 146/93     Pulse Rate 09/07/20 1520 88  Resp 09/07/20 1520 19     Temp 09/07/20 1520 98.4 F (36.9 C)     Temp src --      SpO2 09/07/20 1520 100 %     Weight --      Height --      Head Circumference --      Peak Flow --      Pain Score 09/07/20 1518 0     Pain Loc --      Pain Edu? --      Excl. in [place]? --    No data found.  Updated Vital Signs BP (!) 146/93 (BP Location: Left Arm)   Pulse 88   Temp 98.4 F (36.9 C)   Resp 19   LMP 08/16/2020 (Approximate)   SpO2 100%   Visual Acuity Right Eye Distance:   Left Eye Distance:   Bilateral Distance:    Right Eye Near:   Left Eye Near:    Bilateral Near:     Physical Exam Vitals and nursing note reviewed.  Constitutional:      General: She is not in acute distress.    Appearance: Normal appearance. She is not ill-appearing.  HENT:     Head: Normocephalic and atraumatic.     Right Ear: Tympanic membrane and ear canal normal.     Left Ear: Tympanic membrane and ear canal normal.     Nose: No congestion or rhinorrhea.     Mouth/Throat:     Pharynx: No oropharyngeal exudate or posterior oropharyngeal erythema.  Eyes:     General: No scleral icterus.     Extraocular Movements: Extraocular movements intact.     Conjunctiva/sclera: Conjunctivae normal.  Cardiovascular:     Rate and Rhythm: Normal rate and regular rhythm.     Heart sounds: No murmur heard.   Pulmonary:     Effort: Pulmonary effort is normal. No respiratory distress.     Breath sounds: Normal breath sounds. No wheezing or rales.  Abdominal:     General: There is no distension.     Tenderness: There is no abdominal tenderness. There is no right CVA tenderness, left CVA tenderness, guarding or rebound.  Musculoskeletal:     Cervical back: Normal range of motion. No rigidity.  Skin:    Capillary Refill: Capillary refill takes less than 2 seconds.     Coloration: Skin is not jaundiced.     Findings: No rash.  Neurological:     General: No focal deficit present.     Mental Status: She is alert and oriented to person, place, and time.     Motor: No weakness.     Gait: Gait normal.  Psychiatric:        Mood and Affect: Mood normal.        Behavior: Behavior normal.      UC Treatments / Results  Labs (all labs ordered are listed, but only abnormal results are displayed) Labs Reviewed  POC URINE PREG, ED  CBG MONITORING, ED    EKG   Radiology No results found.  Procedures Procedures (including critical care time)  Medications Ordered in UC Medications - No data to display  Initial Impression / Assessment and Plan / UC Course  I have reviewed the triage vital signs and the nursing notes.  Pertinent labs & imaging results that were available during my care of the patient were reviewed by me and considered in my medical decision making (see chart for details).     Declined  anti-emetic medication in clinic Drink plenty of water, get plenty of rest Blood glucose 86 UPT negative Final Clinical Impressions(s) / UC Diagnoses   Final diagnoses:  Non-intractable vomiting with nausea, unspecified vomiting type     Discharge Instructions     Drink plenty  of water Get plenty of rest Follow up with PCP in 2 - 3 days, go to ED with ANY worsening symptoms.    ED Prescriptions    Medication Sig Dispense Auth. Provider   ondansetron (ZOFRAN) 4 MG tablet Take 1 tablet (4 mg total) by mouth every 6 (six) hours. 12 tablet Peri Jefferson, PA-C     PDMP not reviewed this encounter.   Peri Jefferson, PA-C 09/07/20 1635

## 2020-09-10 ENCOUNTER — Other Ambulatory Visit: Payer: Self-pay

## 2020-09-10 ENCOUNTER — Emergency Department (HOSPITAL_COMMUNITY)
Admission: EM | Admit: 2020-09-10 | Discharge: 2020-09-10 | Disposition: A | Discharge status: Other Acute Inpatient Hospital | Payer: Medicaid Other | Attending: Emergency Medicine | Admitting: Emergency Medicine

## 2020-09-10 ENCOUNTER — Ambulatory Visit (HOSPITAL_COMMUNITY)
Admission: EM | Admit: 2020-09-10 | Discharge: 2020-09-11 | Disposition: A | Discharge status: Home / Self Care | Payer: Medicaid Other | Source: Home / Self Care

## 2020-09-10 ENCOUNTER — Encounter (HOSPITAL_COMMUNITY): Payer: Self-pay | Admitting: Emergency Medicine

## 2020-09-10 DIAGNOSIS — Z20822 Contact with and (suspected) exposure to covid-19: Secondary | ICD-10-CM | POA: Diagnosis not present

## 2020-09-10 DIAGNOSIS — Z79899 Other long term (current) drug therapy: Secondary | ICD-10-CM | POA: Insufficient documentation

## 2020-09-10 DIAGNOSIS — E1169 Type 2 diabetes mellitus with other specified complication: Secondary | ICD-10-CM | POA: Diagnosis not present

## 2020-09-10 DIAGNOSIS — E785 Hyperlipidemia, unspecified: Secondary | ICD-10-CM | POA: Insufficient documentation

## 2020-09-10 DIAGNOSIS — R42 Dizziness and giddiness: Secondary | ICD-10-CM | POA: Insufficient documentation

## 2020-09-10 DIAGNOSIS — F250 Schizoaffective disorder, bipolar type: Secondary | ICD-10-CM

## 2020-09-10 DIAGNOSIS — Z794 Long term (current) use of insulin: Secondary | ICD-10-CM | POA: Diagnosis not present

## 2020-09-10 DIAGNOSIS — R45851 Suicidal ideations: Secondary | ICD-10-CM | POA: Insufficient documentation

## 2020-09-10 DIAGNOSIS — F17210 Nicotine dependence, cigarettes, uncomplicated: Secondary | ICD-10-CM | POA: Insufficient documentation

## 2020-09-10 DIAGNOSIS — I10 Essential (primary) hypertension: Secondary | ICD-10-CM | POA: Insufficient documentation

## 2020-09-10 DIAGNOSIS — Z765 Malingerer [conscious simulation]: Secondary | ICD-10-CM | POA: Diagnosis not present

## 2020-09-10 DIAGNOSIS — E1142 Type 2 diabetes mellitus with diabetic polyneuropathy: Secondary | ICD-10-CM | POA: Diagnosis not present

## 2020-09-10 DIAGNOSIS — F4325 Adjustment disorder with mixed disturbance of emotions and conduct: Secondary | ICD-10-CM | POA: Diagnosis not present

## 2020-09-10 DIAGNOSIS — R457 State of emotional shock and stress, unspecified: Secondary | ICD-10-CM | POA: Diagnosis present

## 2020-09-10 LAB — CBC WITH DIFFERENTIAL/PLATELET
Abs Immature Granulocytes: 0.02 10*3/uL (ref 0.00–0.07)
Basophils Absolute: 0 10*3/uL (ref 0.0–0.1)
Basophils Relative: 1 %
Eosinophils Absolute: 0.4 10*3/uL (ref 0.0–0.5)
Eosinophils Relative: 5 %
HCT: 36.5 % (ref 36.0–46.0)
Hemoglobin: 11.5 g/dL — ABNORMAL LOW (ref 12.0–15.0)
Immature Granulocytes: 0 %
Lymphocytes Relative: 39 %
Lymphs Abs: 2.9 10*3/uL (ref 0.7–4.0)
MCH: 24.1 pg — ABNORMAL LOW (ref 26.0–34.0)
MCHC: 31.5 g/dL (ref 30.0–36.0)
MCV: 76.5 fL — ABNORMAL LOW (ref 80.0–100.0)
Monocytes Absolute: 0.4 10*3/uL (ref 0.1–1.0)
Monocytes Relative: 6 %
Neutro Abs: 3.8 10*3/uL (ref 1.7–7.7)
Neutrophils Relative %: 49 %
Platelets: 204 10*3/uL (ref 150–400)
RBC: 4.77 MIL/uL (ref 3.87–5.11)
RDW: 15.8 % — ABNORMAL HIGH (ref 11.5–15.5)
WBC: 7.5 10*3/uL (ref 4.0–10.5)
nRBC: 0 % (ref 0.0–0.2)

## 2020-09-10 LAB — RESP PANEL BY RT-PCR (FLU A&B, COVID) ARPGX2
Influenza A by PCR: NEGATIVE
Influenza B by PCR: NEGATIVE
SARS Coronavirus 2 by RT PCR: NEGATIVE

## 2020-09-10 LAB — RAPID URINE DRUG SCREEN, HOSP PERFORMED
Amphetamines: NOT DETECTED
Barbiturates: POSITIVE — AB
Benzodiazepines: NOT DETECTED
Cocaine: NOT DETECTED
Opiates: NOT DETECTED
Tetrahydrocannabinol: NOT DETECTED

## 2020-09-10 LAB — COMPREHENSIVE METABOLIC PANEL
ALT: 13 U/L (ref 0–44)
AST: 17 U/L (ref 15–41)
Albumin: 4 g/dL (ref 3.5–5.0)
Alkaline Phosphatase: 49 U/L (ref 38–126)
Anion gap: 5 (ref 5–15)
BUN: 10 mg/dL (ref 6–20)
CO2: 25 mmol/L (ref 22–32)
Calcium: 8.9 mg/dL (ref 8.9–10.3)
Chloride: 110 mmol/L (ref 98–111)
Creatinine, Ser: 0.74 mg/dL (ref 0.44–1.00)
GFR, Estimated: >60 mL/min (ref >60)
Glucose, Bld: 123 mg/dL — ABNORMAL HIGH (ref 70–99)
Potassium: 3.7 mmol/L (ref 3.5–5.1)
Sodium: 140 mmol/L (ref 135–145)
Total Bilirubin: 0.7 mg/dL (ref 0.3–1.2)
Total Protein: 7.5 g/dL (ref 6.5–8.1)

## 2020-09-10 LAB — I-STAT BETA HCG BLOOD, ED (MC, WL, AP ONLY): I-stat hCG, quantitative: <5 m[IU]/mL (ref <5)

## 2020-09-10 MED ORDER — ACETAMINOPHEN 325 MG PO TABS: 650.0000 mg | ORAL_TABLET | Freq: Four times a day (QID) | ORAL | Status: DC | PRN

## 2020-09-10 MED ORDER — ALUM & MAG HYDROXIDE-SIMETH 200-200-20 MG/5ML PO SUSP: 30.0000 mL | ORAL | Status: DC | PRN

## 2020-09-10 MED ORDER — MAGNESIUM HYDROXIDE 400 MG/5ML PO SUSP: 30.0000 mL | Freq: Every day | ORAL | Status: DC | PRN

## 2020-09-10 NOTE — ED Notes (Signed)
Safe transport called for pt transport to [facility]
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
## 2020-09-10 NOTE — ED Notes (Addendum)
Report called for pt transfer to [facility], report given to [facility]. It was requested to give staff 30 min prior to pt transfer.

## 2020-09-10 NOTE — ED Notes (Signed)
Pt given meal tray.
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
## 2020-09-10 NOTE — ED Notes (Signed)
EMTALA doc done and printed. PT wanded by security

## 2020-09-10 NOTE — ED Notes (Signed)
Called [place] for an estimate on pt acceptance, no available nurse at this time to take report. Will try back

## 2020-09-10 NOTE — ED Provider Notes (Addendum)
Behavioral Health Admission H&P [facility] & OBS)  Date: 09/11/20 Patient Name: [name] MRN: [id number] Chief Complaint:  Chief Complaint  Patient presents with  . Suicidal      Diagnoses:  Final diagnoses:  Schizoaffective disorder, bipolar type (HCC)    HPI: [name] is a 30 y.o. female with a history of schizophrenia who presented to  [facility] with dizziness. While there she reported SI. See TTS note for more information. Patient was accepted for transfer to [facility] by Assunta Found, NP.  On evaluation patient is alert and oriented x 4, pleasant, and cooperative. Speech is clear and coherent. She reports that she is here due to thoughts of wanting to hurt people. Denies having thoughts of wanting to hurt others at this time. She states that her medications are not working and that her ACT Team with [facility] is not listening to her. Reports mood as depressed and affect is flat. Thought process is coherent and thought content is logical. Denies current auditory and visual hallucinations. She states "I heard a voice earlier calling me a fat bald bitch." She states that she is not sure if was an actual person or a hallucination. No indication that patient is responding to internal stimuli. Reports feeling paranoid that people are following her.  Denies suicidal ideations. Denies homicidal ideations. Denies substance abuse.   States that she received haldol decanoate injection on 09/10/2020  PHQ 2-9:  Flowsheet Row Clinical Support from 07/05/2020 in [facility] ED from 05/26/2020 in [facility] from 05/22/2020 in [facility]  Thoughts that you would be better off dead, or of hurting yourself in some way Not at all More than half the days Nearly every day  PHQ-9 Total Score Flowsheet Row ED from 09/10/2020 in [facility] Most recent reading at 09/10/2020 11:57 PM ED from 09/10/2020 in [facility] Most recent reading at 09/10/2020  5:38 PM ED from 09/07/2020 in [facility] [place] Most recent reading at 09/07/2020  3:19 PM  C-SSRS RISK CATEGORY Low Risk High Risk Error: Question 6 not populated       Total Time spent with patient: 15 minutes  Musculoskeletal  Strength & Muscle Tone: within normal limits Gait & Station: normal Patient leans: N/A  Psychiatric Specialty Exam  Presentation General Appearance: Appropriate for Environment; Casual; Fairly Groomed  Eye Contact:Good  Speech:Clear and Coherent; Normal Rate  Speech Volume:Normal  Handedness:Right   Mood and Affect  Mood:Dysphoric  Affect:Flat   Thought Process  Thought Processes:Coherent  Descriptions of Associations:Intact  Orientation:Full (Time, Place and Person)  Thought Content:Paranoid Ideation  Diagnosis of Schizophrenia or Schizoaffective disorder in past: Yes  Duration of Psychotic Symptoms: Greater than six months  Hallucinations:Hallucinations: None  Ideas of Reference:Paranoia  Suicidal Thoughts:Suicidal Thoughts: No  Homicidal Thoughts:Homicidal Thoughts: No   Sensorium  Memory:Immediate Good; Recent Good; Remote Good  Judgment:Fair  Insight:Fair   Executive Functions  Concentration:Good  Attention Span:Good  Recall:Good  Fund of Knowledge:Fair  Language:Good   Psychomotor Activity  Psychomotor Activity:Psychomotor Activity: Normal   Assets  Assets:Communication Skills; Desire for Improvement; Financial Resources/Insurance; Housing; Social Support; Transportation   Sleep  Sleep:Sleep: Good   Nutritional Assessment (For OBS and FBC admissions only) Has the patient had a weight loss or gain of 10 pounds or more in the last 3  months?: No Has the patient had a decrease in food intake/or appetite?: No Does the patient have dental  problems?: No Does the patient have eating habits or behaviors that may be indicators of an eating disorder including binging or inducing vomiting?: No Has the patient recently lost weight without trying?: No Has the patient been eating poorly because of a decreased appetite?: No Malnutrition Screening Tool Score: 0    Physical Exam Constitutional:      General: She is not in acute distress.    Appearance: She is not ill-appearing, toxic-appearing or diaphoretic.  HENT:     Right Ear: External ear normal.     Left Ear: External ear normal.  Eyes:     Conjunctiva/sclera: Conjunctivae normal.     Pupils: Pupils are equal, round, and reactive to light.  Cardiovascular:     Rate and Rhythm: Normal rate.  Pulmonary:     Effort: Pulmonary effort is normal. No respiratory distress.  Musculoskeletal:        General: Normal range of motion.  Skin:    General: Skin is warm and dry.  Neurological:     Mental Status: She is alert and oriented to person, place, and time.  Psychiatric:        Mood and Affect: Mood is anxious and depressed.        Thought Content: Thought content is paranoid. Thought content is not delusional. Thought content does not include homicidal or suicidal ideation. Thought content does not include suicidal plan.    Review of Systems  Constitutional: Negative for chills, diaphoresis, fever, malaise/fatigue and weight loss.  HENT: Negative for congestion.   Respiratory: Negative for cough and shortness of breath.   Cardiovascular: Negative for chest pain and palpitations.  Gastrointestinal: Negative for diarrhea, nausea and vomiting.  Neurological: Negative for dizziness and seizures.  Psychiatric/Behavioral: Positive for depression, hallucinations and suicidal ideas. Negative for memory loss and substance abuse. The patient is nervous/anxious and has insomnia.   All other systems reviewed and are negative.   Blood pressure (!) 132/94, pulse 84, temperature 97.8  F (36.6 C), temperature source Tympanic, resp. rate 16, last menstrual period 08/16/2020, SpO2 100 %. There is no height or weight on file to calculate BMI.  Past Psychiatric History: Schizophrenia, bipolar Type  Is the patient at risk to self? No  Has the patient been a risk to self in the past 6 months? Yes .    Has the patient been a risk to self within the distant past? Yes   Is the patient a risk to others? No   Has the patient been a risk to others in the past 6 months? Yes   Has the patient been a risk to others within the distant past? Yes   Past Medical History:  Past Medical History:  Diagnosis Date  . Anxiety   . Bipolar 1 disorder (HCC)   . Cognitive deficits   . Depression   . Diabetes mellitus without complication (HCC)   . Hypertension   . Mental disorder   . Mental health disorder   . Obesity     Past Surgical History:  Procedure Laterality Date  . CESAREAN SECTION    . CESAREAN SECTION N/A 04/25/2013   Procedure: REPEAT CESAREAN SECTION;  Surgeon: Catalina Antigua, MD;  Location: [facility];  Service: Obstetrics;  Laterality: N/A;  . MASS EXCISION N/A 06/03/2012   Procedure: EXCISION MASS;  Surgeon: Osborn Coho, MD;  Location: [facility];  Service:  ENT;  Laterality: N/A;  Excision uvula mass  . TONSILLECTOMY N/A 06/03/2012   Procedure: TONSILLECTOMY;  Surgeon: Osborn Coho, MD;  Location: [facility];  Service: ENT;  Laterality: N/A;  . TONSILLECTOMY      Family History:  Family History  Problem Relation Age of Onset  . Hypertension Mother   . Diabetes Father     Social History:  Social History   Socioeconomic History  . Marital status: Single    Spouse name: Not on file  . Number of children: Not on file  . Years of education: Not on file  . Highest education level: Not on file  Occupational History  . Not on file  Tobacco Use  . Smoking status: Current Every Day Smoker    Packs/day: 4.00    Years: 11.00    Pack  years: 44.00    Types: Cigarettes, Cigars  . Smokeless tobacco: Never Used  . Tobacco comment: Pt declined  Vaping Use  . Vaping Use: Never used  Substance and Sexual Activity  . Alcohol use: Not Currently    Comment: occ: last intake 4 mts ago  . Drug use: Not Currently    Types: "Crack" cocaine, Other-see comments    Comment: Patient reports hx of smoking Crack  . Sexual activity: Yes    Birth control/protection: Implant  Other Topics Concern  . Not on file  Social History Narrative   ** Merged History Encounter **       Social Determinants of Health   Financial Resource Strain: Not on file  Food Insecurity: Not on file  Transportation Needs: Not on file  Physical Activity: Not on file  Stress: Not on file  Social Connections: Not on file  Intimate Partner Violence: Not on file    SDOH:  SDOH Screenings   Alcohol Screen: Low Risk   . Last Alcohol Screening Score (AUDIT): 3  Depression (PHQ2-9): Low Risk   . PHQ-2 Score: 4  Financial Resource Strain: Not on file  Food Insecurity: Not on file  Housing: Not on file  Physical Activity: Not on file  Social Connections: Not on file  Stress: Not on file  Tobacco Use: High Risk  . Smoking Tobacco Use: Current Every Day Smoker  . Smokeless Tobacco Use: Never Used  Transportation Needs: Not on file    Last Labs:  Admission on 09/10/2020  Component Date Value Ref Range Status  . Glucose-Capillary 09/11/2020 216* 70 - 99 mg/dL Final   Glucose reference range applies only to samples taken after fasting for at least 8 hours.  Admission on 09/10/2020, Discharged on 09/10/2020  Component Date Value Ref Range Status  . I-stat hCG, quantitative 09/10/2020 <5.0  <5 mIU/mL Final  . Comment 3 09/10/2020          Final   Comment:   GEST. AGE      CONC.  (mIU/mL)   <=1 WEEK        5 - 50     2 WEEKS       50 - 500     3 WEEKS       100 - 10,000     4 WEEKS     1,000 - 30,000        FEMALE AND NON-PREGNANT FEMALE:     LESS  THAN 5 mIU/mL   . WBC 09/10/2020 7.5  4.0 - 10.5 K/uL Final  . RBC 09/10/2020 4.77  3.87 - 5.11 MIL/uL Final  .  Hemoglobin 09/10/2020 11.5* 12.0 - 15.0 g/dL Final  . HCT 09/10/2020 36.5  36.0 - 46.0 % Final  . MCV 09/10/2020 76.5* 80.0 - 100.0 fL Final  . MCH 09/10/2020 24.1* 26.0 - 34.0 pg Final  . MCHC 09/10/2020 31.5  30.0 - 36.0 g/dL Final  . RDW 09/10/2020 15.8* 11.5 - 15.5 % Final  . Platelets 09/10/2020 204  150 - 400 K/uL Final  . nRBC 09/10/2020 0.0  0.0 - 0.2 % Final  . Neutrophils Relative % 09/10/2020 49  % Final  . Neutro Abs 09/10/2020 3.8  1.7 - 7.7 K/uL Final  . Lymphocytes Relative 09/10/2020 39  % Final  . Lymphs Abs 09/10/2020 2.9  0.7 - 4.0 K/uL Final  . Monocytes Relative 09/10/2020 6  % Final  . Monocytes Absolute 09/10/2020 0.4  0.1 - 1.0 K/uL Final  . Eosinophils Relative 09/10/2020 5  % Final  . Eosinophils Absolute 09/10/2020 0.4  0.0 - 0.5 K/uL Final  . Basophils Relative 09/10/2020 1  % Final  . Basophils Absolute 09/10/2020 0.0  0.0 - 0.1 K/uL Final  . Immature Granulocytes 09/10/2020 0  % Final  . Abs Immature Granulocytes 09/10/2020 0.02  0.00 - 0.07 K/uL Final   Performed at [facility], [place] [address] [place], [place] [address]  . Sodium 09/10/2020 140  135 - 145 mmol/L Final  . Potassium 09/10/2020 3.7  3.5 - 5.1 mmol/L Final  . Chloride 09/10/2020 110  98 - 111 mmol/L Final  . CO2 09/10/2020 25  22 - 32 mmol/L Final  . Glucose, Bld 09/10/2020 123* 70 - 99 mg/dL Final   Glucose reference range applies only to samples taken after fasting for at least 8 hours.  . BUN 09/10/2020 10  6 - 20 mg/dL Final  . Creatinine, Ser 09/10/2020 0.74  0.44 - 1.00 mg/dL Final  . Calcium 09/10/2020 8.9  8.9 - 10.3 mg/dL Final  . Total Protein 09/10/2020 7.5  6.5 - 8.1 g/dL Final  . Albumin 09/10/2020 4.0  3.5 - 5.0 g/dL Final  . AST 09/10/2020 17  15 - 41 U/L Final  . ALT 09/10/2020 13  0 - 44 U/L Final  . Alkaline Phosphatase 09/10/2020 49  38  - 126 U/L Final  . Total Bilirubin 09/10/2020 0.7  0.3 - 1.2 mg/dL Final  . GFR, Estimated 09/10/2020 >60  >60 mL/min Final   Comment: (NOTE) Calculated using the CKD-EPI Creatinine Equation (2021)   . Anion gap 09/10/2020 5  5 - 15 Final   Performed at [facility], [place] [address] [place], [place] [address]  . Opiates 09/10/2020 NONE DETECTED  NONE DETECTED Final  . Cocaine 09/10/2020 NONE DETECTED  NONE DETECTED Final  . Benzodiazepines 09/10/2020 NONE DETECTED  NONE DETECTED Final  . Amphetamines 09/10/2020 NONE DETECTED  NONE DETECTED Final  . Tetrahydrocannabinol 09/10/2020 NONE DETECTED  NONE DETECTED Final  . Barbiturates 09/10/2020 POSITIVE* NONE DETECTED Final   Comment: (NOTE) DRUG SCREEN FOR MEDICAL PURPOSES ONLY.  IF CONFIRMATION IS NEEDED FOR ANY PURPOSE, NOTIFY LAB WITHIN 5 DAYS.  LOWEST DETECTABLE LIMITS FOR URINE DRUG SCREEN Drug Class                     Cutoff (ng/mL) Amphetamine and metabolites    1000 Barbiturate and metabolites    200 Benzodiazepine                 725 Tricyclics and metabolites  300 Opiates and metabolites        300 Cocaine and metabolites        300 THC                            50 Performed at [place] [address], [place], [place] [address]   . SARS Coronavirus 2 by RT PCR 09/10/2020 NEGATIVE  NEGATIVE Final   Comment: (NOTE) SARS-CoV-2 target nucleic acids are NOT DETECTED.  The SARS-CoV-2 RNA is generally detectable in upper respiratory specimens during the acute phase of infection. The lowest concentration of SARS-CoV-2 viral copies this assay can detect is 138 copies/mL. A negative result does not preclude SARS-Cov-2 infection and should not be used as the sole basis for treatment or other patient management decisions. A negative result may occur with  improper specimen collection/handling, submission of specimen other than nasopharyngeal swab, presence of viral mutation(s)  within the areas targeted by this assay, and inadequate number of viral copies(<138 copies/mL). A negative result must be combined with clinical observations, patient history, and epidemiological information. The expected result is Negative.  Fact Sheet for Patients:  EntrepreneurPulse.com.au  Fact Sheet for Healthcare Providers:  IncredibleEmployment.be  This test is no                          t yet approved or cleared by the [place] FDA and  has been authorized for detection and/or diagnosis of SARS-CoV-2 by FDA under an Emergency Use Authorization (EUA). This EUA will remain  in effect (meaning this test can be used) for the duration of the COVID-19 declaration under Section 564(b)(1) of the Act, 21 U.S.C.section 360bbb-3(b)(1), unless the authorization is terminated  or revoked sooner.      . Influenza A by PCR 09/10/2020 NEGATIVE  NEGATIVE Final  . Influenza B by PCR 09/10/2020 NEGATIVE  NEGATIVE Final   Comment: (NOTE) The Xpert Xpress SARS-CoV-2/FLU/RSV plus assay is intended as an aid in the diagnosis of influenza from Nasopharyngeal swab specimens and should not be used as a sole basis for treatment. Nasal washings and aspirates are unacceptable for Xpert Xpress SARS-CoV-2/FLU/RSV testing.  Fact Sheet for Patients: EntrepreneurPulse.com.au  Fact Sheet for Healthcare Providers: IncredibleEmployment.be  This test is not yet approved or cleared by the [place] FDA and has been authorized for detection and/or diagnosis of SARS-CoV-2 by FDA under an Emergency Use Authorization (EUA). This EUA will remain in effect (meaning this test can be used) for the duration of the COVID-19 declaration under Section 564(b)(1) of the Act, 21 U.S.C. section 360bbb-3(b)(1), unless the authorization is terminated or revoked.  Performed at [facility], [place] [address], [place], [place] [address]   Admission on 09/07/2020, Discharged on 09/07/2020  Component Date Value Ref Range Status  . Preg Test, Ur 09/07/2020 NEGATIVE  NEGATIVE Final   Comment:        THE SENSITIVITY OF THIS METHODOLOGY IS >24 mIU/mL   . Glucose-Capillary 09/07/2020 84  70 - 99 mg/dL Final   Glucose reference range applies only to samples taken after fasting for at least 8 hours.  Admission on 08/26/2020, Discharged on 08/27/2020  Component Date Value Ref Range Status  . SARS Coronavirus 2 by RT PCR 08/26/2020 NEGATIVE  NEGATIVE Final   Comment: (NOTE) SARS-CoV-2 target nucleic acids are NOT DETECTED.  The SARS-CoV-2 RNA is generally detectable in upper respiratory  specimens during the acute phase of infection. The lowest concentration of SARS-CoV-2 viral copies this assay can detect is 138 copies/mL. A negative result does not preclude SARS-Cov-2 infection and should not be used as the sole basis for treatment or other patient management decisions. A negative result may occur with  improper specimen collection/handling, submission of specimen other than nasopharyngeal swab, presence of viral mutation(s) within the areas targeted by this assay, and inadequate number of viral copies(<138 copies/mL). A negative result must be combined with clinical observations, patient history, and epidemiological information. The expected result is Negative.  Fact Sheet for Patients:  BloggerCourse.comhttps://www.fda.gov/media/152166/download  Fact Sheet for Healthcare Providers:  SeriousBroker.ithttps://www.fda.gov/media/152162/download  This test is no                          t yet approved or cleared by the [place] FDA and  has been authorized for detection and/or diagnosis of SARS-CoV-2 by FDA under an Emergency Use Authorization (EUA). This EUA will remain  in effect (meaning this test can be used) for the duration of the COVID-19 declaration under Section 564(b)(1) of the Act, 21 U.S.C.section  360bbb-3(b)(1), unless the authorization is terminated  or revoked sooner.      . Influenza A by PCR 08/26/2020 NEGATIVE  NEGATIVE Final  . Influenza B by PCR 08/26/2020 NEGATIVE  NEGATIVE Final   Comment: (NOTE) The Xpert Xpress SARS-CoV-2/FLU/RSV plus assay is intended as an aid in the diagnosis of influenza from Nasopharyngeal swab specimens and should not be used as a sole basis for treatment. Nasal washings and aspirates are unacceptable for Xpert Xpress SARS-CoV-2/FLU/RSV testing.  Fact Sheet for Patients: BloggerCourse.comhttps://www.fda.gov/media/152166/download  Fact Sheet for Healthcare Providers: SeriousBroker.ithttps://www.fda.gov/media/152162/download  This test is not yet approved or cleared by the [place] FDA and has been authorized for detection and/or diagnosis of SARS-CoV-2 by FDA under an Emergency Use Authorization (EUA). This EUA will remain in effect (meaning this test can be used) for the duration of the COVID-19 declaration under Section 564(b)(1) of the Act, 21 U.S.C. section 360bbb-3(b)(1), unless the authorization is terminated or revoked.  Performed at [facility], [address] [place], [place] [address]   . SARS Coronavirus 2 Ag 08/26/2020 Negative  Negative Final  . WBC 08/26/2020 12.8* 4.0 - 10.5 K/uL Final  . RBC 08/26/2020 4.64  3.87 - 5.11 MIL/uL Final  . Hemoglobin 08/26/2020 11.1* 12.0 - 15.0 g/dL Final  . HCT 60/45/409805/05/2020 35.4* 36.0 - 46.0 % Final  . MCV 08/26/2020 76.3* 80.0 - 100.0 fL Final  . MCH 08/26/2020 23.9* 26.0 - 34.0 pg Final  . MCHC 08/26/2020 31.4  30.0 - 36.0 g/dL Final  . RDW 11/91/478205/05/2020 15.0  11.5 - 15.5 % Final  . Platelets 08/26/2020 256  150 - 400 K/uL Final   REPEATED TO VERIFY  . nRBC 08/26/2020 0.0  0.0 - 0.2 % Final  . Neutrophils Relative % 08/26/2020 67  % Final  . Neutro Abs 08/26/2020 8.5* 1.7 - 7.7 K/uL Final  . Lymphocytes Relative 08/26/2020 25  % Final  . Lymphs Abs 08/26/2020 3.2  0.7 - 4.0 K/uL Final  . Monocytes  Relative 08/26/2020 5  % Final  . Monocytes Absolute 08/26/2020 0.7  0.1 - 1.0 K/uL Final  . Eosinophils Relative 08/26/2020 2  % Final  . Eosinophils Absolute 08/26/2020 0.3  0.0 - 0.5 K/uL Final  . Basophils Relative 08/26/2020 1  % Final  . Basophils Absolute 08/26/2020  0.1  0.0 - 0.1 K/uL Final  . Immature Granulocytes 08/26/2020 0  % Final  . Abs Immature Granulocytes 08/26/2020 0.05  0.00 - 0.07 K/uL Final   Performed at [facility], [place] [address] [place] [address], [place], [place] [address]  . Sodium 08/26/2020 137  135 - 145 mmol/L Final  . Potassium 08/26/2020 4.4  3.5 - 5.1 mmol/L Final  . Chloride 08/26/2020 105  98 - 111 mmol/L Final  . CO2 08/26/2020 26  22 - 32 mmol/L Final  . Glucose, Bld 08/26/2020 118* 70 - 99 mg/dL Final   Glucose reference range applies only to samples taken after fasting for at least 8 hours.  . BUN 08/26/2020 13  6 - 20 mg/dL Final  . Creatinine, Ser 08/26/2020 0.71  0.44 - 1.00 mg/dL Final  . Calcium 08/26/2020 9.5  8.9 - 10.3 mg/dL Final  . Total Protein 08/26/2020 7.2  6.5 - 8.1 g/dL Final  . Albumin 08/26/2020 4.2  3.5 - 5.0 g/dL Final  . AST 08/26/2020 18  15 - 41 U/L Final  . ALT 08/26/2020 19  0 - 44 U/L Final  . Alkaline Phosphatase 08/26/2020 56  38 - 126 U/L Final  . Total Bilirubin 08/26/2020 0.4  0.3 - 1.2 mg/dL Final  . GFR, Estimated 08/26/2020 >60  >60 mL/min Final   Comment: (NOTE) Calculated using the CKD-EPI Creatinine Equation (2021)   . Anion gap 08/26/2020 6  5 - 15 Final   Performed at [facility], [place] [address], [place], [place] [address]  . Hgb A1c MFr Bld 08/26/2020 7.3* 4.8 - 5.6 % Final   Comment: (NOTE) Pre diabetes:          5.7%-6.4%  Diabetes:              >6.4%  Glycemic control for   <7.0% adults with diabetes   . Mean Plasma Glucose 08/26/2020 162.81  mg/dL Final   Performed at [place] [address], [place], [place] [address]  . Alcohol, Ethyl (B) 08/26/2020 <10  <10 mg/dL Final   Comment:  (NOTE) Lowest detectable limit for serum alcohol is 10 mg/dL.  For medical purposes only. Performed at [facility], [place] [address] [place], [place] [address]   . Cholesterol 08/26/2020 172  0 - 200 mg/dL Final  . Triglycerides 08/26/2020 166* <150 mg/dL Final  . HDL 08/26/2020 50  >40 mg/dL Final  . Total CHOL/HDL Ratio 08/26/2020 3.4  RATIO Final  . VLDL 08/26/2020 33  0 - 40 mg/dL Final  . LDL Cholesterol 08/26/2020 89  0 - 99 mg/dL Final   Comment:        Total Cholesterol/HDL:CHD Risk Coronary Heart Disease Risk Table                     Men   Women  1/2 Average Risk   3.4   3.3  Average Risk       5.0   4.4  2 X Average Risk   9.6   7.1  3 X Average Risk  23.4   11.0        Use the calculated Patient Ratio above and the CHD Risk Table to determine the patient's CHD Risk.        ATP III CLASSIFICATION (LDL):  <100     mg/dL   Optimal  100-129  mg/dL   Near or Above  Optimal  130-159  mg/dL   Borderline  161-096  mg/dL   High  >045     mg/dL   Very High Performed at [facility], [address], [place], [place] [address]   . TSH 08/26/2020 0.758  0.350 - 4.500 uIU/mL Final   Comment: Performed by a 3rd Generation assay with a functional sensitivity of <=0.01 uIU/mL. Performed at [facility], [address] [place], [place] [address]   . POC Amphetamine UR 08/27/2020 None Detected  NONE DETECTED (Cut Off Level 1000 ng/mL) Final  . POC Secobarbital (BAR) 08/27/2020 None Detected  NONE DETECTED (Cut Off Level 300 ng/mL) Final  . POC Buprenorphine (BUP) 08/27/2020 Positive* NONE DETECTED (Cut Off Level 10 ng/mL) Final  . POC Oxazepam (BZO) 08/27/2020 None Detected  NONE DETECTED (Cut Off Level 300 ng/mL) Final  . POC Cocaine UR 08/27/2020 None Detected  NONE DETECTED (Cut Off Level 300 ng/mL) Final  . POC Methamphetamine UR 08/27/2020 None Detected  NONE DETECTED (Cut Off Level 1000 ng/mL) Final  . POC Morphine 08/27/2020 None  Detected  NONE DETECTED (Cut Off Level 300 ng/mL) Final  . POC Oxycodone UR 08/27/2020 None Detected  NONE DETECTED (Cut Off Level 100 ng/mL) Final  . POC Methadone UR 08/27/2020 None Detected  NONE DETECTED (Cut Off Level 300 ng/mL) Final  . POC Marijuana UR 08/27/2020 Positive* NONE DETECTED (Cut Off Level 50 ng/mL) Final  . Glucose-Capillary 08/27/2020 214* 70 - 99 mg/dL Final   Glucose reference range applies only to samples taken after fasting for at least 8 hours.  . Glucose-Capillary 08/27/2020 120* 70 - 99 mg/dL Final   Glucose reference range applies only to samples taken after fasting for at least 8 hours.  . Preg Test, Ur 08/27/2020 NEGATIVE  NEGATIVE Final   Comment:        THE SENSITIVITY OF THIS METHODOLOGY IS >24 mIU/mL   Admission on 08/12/2020, Discharged on 08/13/2020  Component Date Value Ref Range Status  . Glucose-Capillary 08/12/2020 175* 70 - 99 mg/dL Final   Glucose reference range applies only to samples taken after fasting for at least 8 hours.  . Glucose-Capillary 08/13/2020 117* 70 - 99 mg/dL Final   Glucose reference range applies only to samples taken after fasting for at least 8 hours.  Admission on 08/12/2020, Discharged on 08/12/2020  Component Date Value Ref Range Status  . Opiates 08/12/2020 NONE DETECTED  NONE DETECTED Final  . Cocaine 08/12/2020 NONE DETECTED  NONE DETECTED Final  . Benzodiazepines 08/12/2020 NONE DETECTED  NONE DETECTED Final  . Amphetamines 08/12/2020 NONE DETECTED  NONE DETECTED Final  . Tetrahydrocannabinol 08/12/2020 NONE DETECTED  NONE DETECTED Final  . Barbiturates 08/12/2020 POSITIVE* NONE DETECTED Final   Comment: (NOTE) DRUG SCREEN FOR MEDICAL PURPOSES ONLY.  IF CONFIRMATION IS NEEDED FOR ANY PURPOSE, NOTIFY LAB WITHIN 5 DAYS.  LOWEST DETECTABLE LIMITS FOR URINE DRUG SCREEN Drug Class                     Cutoff (ng/mL) Amphetamine and metabolites    1000 Barbiturate and metabolites    200 Benzodiazepine                  200 Tricyclics and metabolites     300 Opiates and metabolites        300 Cocaine and metabolites        300 THC  50 Performed at [facility], [address], [place], [place] [address]   . Alcohol, Ethyl (B) 08/12/2020 <10  <10 mg/dL Final   Comment: (NOTE) Lowest detectable limit for serum alcohol is 10 mg/dL.  For medical purposes only. Performed at [facility] [place], [address], [place], [place] [address]   . WBC 08/12/2020 10.1  4.0 - 10.5 K/uL Final  . RBC 08/12/2020 4.92  3.87 - 5.11 MIL/uL Final  . Hemoglobin 08/12/2020 11.8* 12.0 - 15.0 g/dL Final  . HCT 09/81/1914 38.4  36.0 - 46.0 % Final  . MCV 08/12/2020 78.0* 80.0 - 100.0 fL Final  . MCH 08/12/2020 24.0* 26.0 - 34.0 pg Final  . MCHC 08/12/2020 30.7  30.0 - 36.0 g/dL Final  . RDW 78/29/5621 15.7* 11.5 - 15.5 % Final  . Platelets 08/12/2020 225  150 - 400 K/uL Final  . nRBC 08/12/2020 0.0  0.0 - 0.2 % Final  . Neutrophils Relative % 08/12/2020 56  % Final  . Neutro Abs 08/12/2020 5.6  1.7 - 7.7 K/uL Final  . Lymphocytes Relative 08/12/2020 35  % Final  . Lymphs Abs 08/12/2020 3.6  0.7 - 4.0 K/uL Final  . Monocytes Relative 08/12/2020 4  % Final  . Monocytes Absolute 08/12/2020 0.4  0.1 - 1.0 K/uL Final  . Eosinophils Relative 08/12/2020 4  % Final  . Eosinophils Absolute 08/12/2020 0.4  0.0 - 0.5 K/uL Final  . Basophils Relative 08/12/2020 1  % Final  . Basophils Absolute 08/12/2020 0.1  0.0 - 0.1 K/uL Final  . Immature Granulocytes 08/12/2020 0  % Final  . Abs Immature Granulocytes 08/12/2020 0.03  0.00 - 0.07 K/uL Final   Performed at [facility], [address], [place], [place] [address]  . Sodium 08/12/2020 141  135 - 145 mmol/L Final  . Potassium 08/12/2020 3.8  3.5 - 5.1 mmol/L Final  . Chloride 08/12/2020 108  98 - 111 mmol/L Final  . CO2 08/12/2020 25  22 - 32 mmol/L Final  . Glucose, Bld 08/12/2020 156* 70 - 99 mg/dL  Final   Glucose reference range applies only to samples taken after fasting for at least 8 hours.  . BUN 08/12/2020 11  6 - 20 mg/dL Final  . Creatinine, Ser 08/12/2020 0.79  0.44 - 1.00 mg/dL Final  . Calcium 78/46/9629 9.4  8.9 - 10.3 mg/dL Final  . Total Protein 08/12/2020 7.9  6.5 - 8.1 g/dL Final  . Albumin 52/84/1324 4.2  3.5 - 5.0 g/dL Final  . AST 40/01/2724 25  15 - 41 U/L Final  . ALT 08/12/2020 19  0 - 44 U/L Final  . Alkaline Phosphatase 08/12/2020 52  38 - 126 U/L Final  . Total Bilirubin 08/12/2020 0.6  0.3 - 1.2 mg/dL Final  . GFR, Estimated 08/12/2020 >60  >60 mL/min Final   Comment: (NOTE) Calculated using the CKD-EPI Creatinine Equation (2021)   . Anion gap 08/12/2020 8  5 - 15 Final   Performed at [facility], [address] [place], [place] [address]  . I-stat hCG, quantitative 08/12/2020 <5.0  <5 mIU/mL Final  . Comment 3 08/12/2020          Final   Comment:   GEST. AGE      CONC.  (mIU/mL)   <=1 WEEK        5 - 50     2 WEEKS       50 -  500     3 WEEKS       100 - 10,000     4 WEEKS     1,000 - 30,000        FEMALE AND NON-PREGNANT FEMALE:     LESS THAN 5 mIU/mL   . SARS Coronavirus 2 by RT PCR 08/12/2020 NEGATIVE  NEGATIVE Final   Comment: (NOTE) SARS-CoV-2 target nucleic acids are NOT DETECTED.  The SARS-CoV-2 RNA is generally detectable in upper respiratory specimens during the acute phase of infection. The lowest concentration of SARS-CoV-2 viral copies this assay can detect is 138 copies/mL. A negative result does not preclude SARS-Cov-2 infection and should not be used as the sole basis for treatment or other patient management decisions. A negative result may occur with  improper specimen collection/handling, submission of specimen other than nasopharyngeal swab, presence of viral mutation(s) within the areas targeted by this assay, and inadequate number of viral copies(<138 copies/mL). A negative result must be combined  with clinical observations, patient history, and epidemiological information. The expected result is Negative.  Fact Sheet for Patients:  BloggerCourse.com  Fact Sheet for Healthcare Providers:  SeriousBroker.it  This test is no                          t yet approved or cleared by the [place] FDA and  has been authorized for detection and/or diagnosis of SARS-CoV-2 by FDA under an Emergency Use Authorization (EUA). This EUA will remain  in effect (meaning this test can be used) for the duration of the COVID-19 declaration under Section 564(b)(1) of the Act, 21 U.S.C.section 360bbb-3(b)(1), unless the authorization is terminated  or revoked sooner.      . Influenza A by PCR 08/12/2020 NEGATIVE  NEGATIVE Final  . Influenza B by PCR 08/12/2020 NEGATIVE  NEGATIVE Final   Comment: (NOTE) The Xpert Xpress SARS-CoV-2/FLU/RSV plus assay is intended as an aid in the diagnosis of influenza from Nasopharyngeal swab specimens and should not be used as a sole basis for treatment. Nasal washings and aspirates are unacceptable for Xpert Xpress SARS-CoV-2/FLU/RSV testing.  Fact Sheet for Patients: BloggerCourse.com  Fact Sheet for Healthcare Providers: SeriousBroker.it  This test is not yet approved or cleared by the [place] FDA and has been authorized for detection and/or diagnosis of SARS-CoV-2 by FDA under an Emergency Use Authorization (EUA). This EUA will remain in effect (meaning this test can be used) for the duration of the COVID-19 declaration under Section 564(b)(1) of the Act, 21 U.S.C. section 360bbb-3(b)(1), unless the authorization is terminated or revoked.  Performed at [facility], [address], [place], [place] [address]   . Glucose-Capillary 08/12/2020 157* 70 - 99 mg/dL Final   Glucose reference range applies only to samples taken  after fasting for at least 8 hours.  Admission on 07/29/2020, Discharged on 07/30/2020  Component Date Value Ref Range Status  . SARS Coronavirus 2 by RT PCR 07/29/2020 NEGATIVE  NEGATIVE Final   Comment: (NOTE) SARS-CoV-2 target nucleic acids are NOT DETECTED.  The SARS-CoV-2 RNA is generally detectable in upper respiratory specimens during the acute phase of infection. The lowest concentration of SARS-CoV-2 viral copies this assay can detect is 138 copies/mL. A negative result does not preclude SARS-Cov-2 infection and should not be used as the sole basis for treatment or other patient management decisions. A negative result may occur with  improper specimen collection/handling, submission of specimen other than nasopharyngeal  swab, presence of viral mutation(s) within the areas targeted by this assay, and inadequate number of viral copies(<138 copies/mL). A negative result must be combined with clinical observations, patient history, and epidemiological information. The expected result is Negative.  Fact Sheet for Patients:  BloggerCourse.com  Fact Sheet for Healthcare Providers:  SeriousBroker.it  This test is no                          t yet approved or cleared by the [place] FDA and  has been authorized for detection and/or diagnosis of SARS-CoV-2 by FDA under an Emergency Use Authorization (EUA). This EUA will remain  in effect (meaning this test can be used) for the duration of the COVID-19 declaration under Section 564(b)(1) of the Act, 21 U.S.C.section 360bbb-3(b)(1), unless the authorization is terminated  or revoked sooner.      . Influenza A by PCR 07/29/2020 NEGATIVE  NEGATIVE Final  . Influenza B by PCR 07/29/2020 NEGATIVE  NEGATIVE Final   Comment: (NOTE) The Xpert Xpress SARS-CoV-2/FLU/RSV plus assay is intended as an aid in the diagnosis of influenza from Nasopharyngeal swab specimens and should not be  used as a sole basis for treatment. Nasal washings and aspirates are unacceptable for Xpert Xpress SARS-CoV-2/FLU/RSV testing.  Fact Sheet for Patients: BloggerCourse.com  Fact Sheet for Healthcare Providers: SeriousBroker.it  This test is not yet approved or cleared by the [place] FDA and has been authorized for detection and/or diagnosis of SARS-CoV-2 by FDA under an Emergency Use Authorization (EUA). This EUA will remain in effect (meaning this test can be used) for the duration of the COVID-19 declaration under Section 564(b)(1) of the Act, 21 U.S.C. section 360bbb-3(b)(1), unless the authorization is terminated or revoked.  Performed at [facility], [address] [place], [place] [address]   . Sodium 07/29/2020 140  135 - 145 mmol/L Final  . Potassium 07/29/2020 3.9  3.5 - 5.1 mmol/L Final  . Chloride 07/29/2020 108  98 - 111 mmol/L Final  . CO2 07/29/2020 26  22 - 32 mmol/L Final  . Glucose, Bld 07/29/2020 167* 70 - 99 mg/dL Final   Glucose reference range applies only to samples taken after fasting for at least 8 hours.  . BUN 07/29/2020 10  6 - 20 mg/dL Final  . Creatinine, Ser 07/29/2020 0.79  0.44 - 1.00 mg/dL Final  . Calcium 48/18/5631 9.2  8.9 - 10.3 mg/dL Final  . Total Protein 07/29/2020 7.2  6.5 - 8.1 g/dL Final  . Albumin 49/70/2637 4.0  3.5 - 5.0 g/dL Final  . AST 85/88/5027 23  15 - 41 U/L Final  . ALT 07/29/2020 19  0 - 44 U/L Final  . Alkaline Phosphatase 07/29/2020 51  38 - 126 U/L Final  . Total Bilirubin 07/29/2020 0.5  0.3 - 1.2 mg/dL Final  . GFR, Estimated 07/29/2020 >60  >60 mL/min Final   Comment: (NOTE) Calculated using the CKD-EPI Creatinine Equation (2021)   . Anion gap 07/29/2020 6  5 - 15 Final   Performed at [facility], [address] [place], [place] [address]  . Alcohol, Ethyl (B) 07/29/2020 <10  <10 mg/dL Final   Comment: (NOTE) Lowest  detectable limit for serum alcohol is 10 mg/dL.  For medical purposes only. Performed at [facility] [address], [place], [place] [address]   . Opiates 07/29/2020 NONE DETECTED  NONE DETECTED Final  . Cocaine 07/29/2020  NONE DETECTED  NONE DETECTED Final  . Benzodiazepines 07/29/2020 NONE DETECTED  NONE DETECTED Final  . Amphetamines 07/29/2020 NONE DETECTED  NONE DETECTED Final  . Tetrahydrocannabinol 07/29/2020 NONE DETECTED  NONE DETECTED Final  . Barbiturates 07/29/2020 POSITIVE* NONE DETECTED Final   Comment: (NOTE) DRUG SCREEN FOR MEDICAL PURPOSES ONLY.  IF CONFIRMATION IS NEEDED FOR ANY PURPOSE, NOTIFY LAB WITHIN 5 DAYS.  LOWEST DETECTABLE LIMITS FOR URINE DRUG SCREEN Drug Class                     Cutoff (ng/mL) Amphetamine and metabolites    1000 Barbiturate and metabolites    200 Benzodiazepine                 200 Tricyclics and metabolites     300 Opiates and metabolites        300 Cocaine and metabolites        300 THC                            50 Performed at [facility], [address], [place], [place] [address]   . WBC 07/29/2020 8.7  4.0 - 10.5 K/uL Final  . RBC 07/29/2020 4.31  3.87 - 5.11 MIL/uL Final  . Hemoglobin 07/29/2020 10.5* 12.0 - 15.0 g/dL Final  . HCT 60/45/4098 33.6* 36.0 - 46.0 % Final  . MCV 07/29/2020 78.0* 80.0 - 100.0 fL Final  . MCH 07/29/2020 24.4* 26.0 - 34.0 pg Final  . MCHC 07/29/2020 31.3  30.0 - 36.0 g/dL Final  . RDW 11/91/4782 15.0  11.5 - 15.5 % Final  . Platelets 07/29/2020 260  150 - 400 K/uL Final  . nRBC 07/29/2020 0.0  0.0 - 0.2 % Final  . Neutrophils Relative % 07/29/2020 45  % Final  . Neutro Abs 07/29/2020 3.9  1.7 - 7.7 K/uL Final  . Lymphocytes Relative 07/29/2020 44  % Final  . Lymphs Abs 07/29/2020 3.8  0.7 - 4.0 K/uL Final  . Monocytes Relative 07/29/2020 6  % Final  . Monocytes Absolute 07/29/2020 0.5  0.1 - 1.0 K/uL Final  . Eosinophils Relative 07/29/2020 4  %  Final  . Eosinophils Absolute 07/29/2020 0.3  0.0 - 0.5 K/uL Final  . Basophils Relative 07/29/2020 1  % Final  . Basophils Absolute 07/29/2020 0.1  0.0 - 0.1 K/uL Final  . Immature Granulocytes 07/29/2020 0  % Final  . Abs Immature Granulocytes 07/29/2020 0.02  0.00 - 0.07 K/uL Final   Performed at [facility], [address] [place], [place] [address]  . I-stat hCG, quantitative 07/29/2020 <5.0  <5 mIU/mL Final  . Comment 3 07/29/2020          Final   Comment:   GEST. AGE      CONC.  (mIU/mL)   <=1 WEEK        5 - 50     2 WEEKS       50 - 500     3 WEEKS       100 - 10,000     4 WEEKS     1,000 - 30,000        FEMALE AND NON-PREGNANT FEMALE:     LESS THAN 5 mIU/mL   Admission on 07/23/2020, Discharged on 07/24/2020  Component Date Value Ref Range Status  . Glucose-Capillary 07/23/2020 100* 70 - 99 mg/dL Final   Glucose reference  range applies only to samples taken after fasting for at least 8 hours.  Marland Kitchen SARS Coronavirus 2 by RT PCR 07/23/2020 NEGATIVE  NEGATIVE Final   Comment: (NOTE) SARS-CoV-2 target nucleic acids are NOT DETECTED.  The SARS-CoV-2 RNA is generally detectable in upper respiratory specimens during the acute phase of infection. The lowest concentration of SARS-CoV-2 viral copies this assay can detect is 138 copies/mL. A negative result does not preclude SARS-Cov-2 infection and should not be used as the sole basis for treatment or other patient management decisions. A negative result may occur with  improper specimen collection/handling, submission of specimen other than nasopharyngeal swab, presence of viral mutation(s) within the areas targeted by this assay, and inadequate number of viral copies(<138 copies/mL). A negative result must be combined with clinical observations, patient history, and epidemiological information. The expected result is Negative.  Fact Sheet for Patients:  BloggerCourse.com  Fact Sheet  for Healthcare Providers:  SeriousBroker.it  This test is no                          t yet approved or cleared by the [place] FDA and  has been authorized for detection and/or diagnosis of SARS-CoV-2 by FDA under an Emergency Use Authorization (EUA). This EUA will remain  in effect (meaning this test can be used) for the duration of the COVID-19 declaration under Section 564(b)(1) of the Act, 21 U.S.C.section 360bbb-3(b)(1), unless the authorization is terminated  or revoked sooner.      . Influenza A by PCR 07/23/2020 NEGATIVE  NEGATIVE Final  . Influenza B by PCR 07/23/2020 NEGATIVE  NEGATIVE Final   Comment: (NOTE) The Xpert Xpress SARS-CoV-2/FLU/RSV plus assay is intended as an aid in the diagnosis of influenza from Nasopharyngeal swab specimens and should not be used as a sole basis for treatment. Nasal washings and aspirates are unacceptable for Xpert Xpress SARS-CoV-2/FLU/RSV testing.  Fact Sheet for Patients: BloggerCourse.com  Fact Sheet for Healthcare Providers: SeriousBroker.it  This test is not yet approved or cleared by the [place] FDA and has been authorized for detection and/or diagnosis of SARS-CoV-2 by FDA under an Emergency Use Authorization (EUA). This EUA will remain in effect (meaning this test can be used) for the duration of the COVID-19 declaration under Section 564(b)(1) of the Act, 21 U.S.C. section 360bbb-3(b)(1), unless the authorization is terminated or revoked.  Performed at [facility], [address], [place], [place] [address]   . Sodium 07/23/2020 139  135 - 145 mmol/L Final  . Potassium 07/23/2020 3.9  3.5 - 5.1 mmol/L Final  . Chloride 07/23/2020 107  98 - 111 mmol/L Final  . CO2 07/23/2020 24  22 - 32 mmol/L Final  . Glucose, Bld 07/23/2020 88  70 - 99 mg/dL Final   Glucose reference range applies only to samples taken after  fasting for at least 8 hours.  . BUN 07/23/2020 9  6 - 20 mg/dL Final  . Creatinine, Ser 07/23/2020 0.81  0.44 - 1.00 mg/dL Final  . Calcium 81/27/5170 9.2  8.9 - 10.3 mg/dL Final  . Total Protein 07/23/2020 7.6  6.5 - 8.1 g/dL Final  . Albumin 01/74/9449 3.9  3.5 - 5.0 g/dL Final  . AST 67/59/1638 19  15 - 41 U/L Final  . ALT 07/23/2020 16  0 - 44 U/L Final  . Alkaline Phosphatase 07/23/2020 50  38 - 126 U/L Final  . Total Bilirubin 07/23/2020 0.2* 0.3 -  1.2 mg/dL Final  . GFR, Estimated 07/23/2020 >60  >60 mL/min Final   Comment: (NOTE) Calculated using the CKD-EPI Creatinine Equation (2021)   . Anion gap 07/23/2020 8  5 - 15 Final   Performed at [facility], [place] [address], [place], [place] [address]  . Alcohol, Ethyl (B) 07/23/2020 <10  <10 mg/dL Final   Comment: (NOTE) Lowest detectable limit for serum alcohol is 10 mg/dL.  For medical purposes only. Performed at [facility], [place] [address] [place], [place] [address]   . Opiates 07/23/2020 NONE DETECTED  NONE DETECTED Final  . Cocaine 07/23/2020 NONE DETECTED  NONE DETECTED Final  . Benzodiazepines 07/23/2020 NONE DETECTED  NONE DETECTED Final  . Amphetamines 07/23/2020 NONE DETECTED  NONE DETECTED Final  . Tetrahydrocannabinol 07/23/2020 NONE DETECTED  NONE DETECTED Final  . Barbiturates 07/23/2020 POSITIVE* NONE DETECTED Final   Comment: (NOTE) DRUG SCREEN FOR MEDICAL PURPOSES ONLY.  IF CONFIRMATION IS NEEDED FOR ANY PURPOSE, NOTIFY LAB WITHIN 5 DAYS.  LOWEST DETECTABLE LIMITS FOR URINE DRUG SCREEN Drug Class                     Cutoff (ng/mL) Amphetamine and metabolites    1000 Barbiturate and metabolites    200 Benzodiazepine                 253 Tricyclics and metabolites     300 Opiates and metabolites        300 Cocaine and metabolites        300 THC                            50 Performed at [facility], [place] [address] [place], [place] [address]    . WBC 07/23/2020 11.0* 4.0 - 10.5 K/uL Final  . RBC 07/23/2020 4.72  3.87 - 5.11 MIL/uL Final  . Hemoglobin 07/23/2020 11.5* 12.0 - 15.0 g/dL Final  . HCT 07/23/2020 36.7  36.0 - 46.0 % Final  . MCV 07/23/2020 77.8* 80.0 - 100.0 fL Final  . MCH 07/23/2020 24.4* 26.0 - 34.0 pg Final  . MCHC 07/23/2020 31.3  30.0 - 36.0 g/dL Final  . RDW 07/23/2020 14.9  11.5 - 15.5 % Final  . Platelets 07/23/2020 282  150 - 400 K/uL Final  . nRBC 07/23/2020 0.0  0.0 - 0.2 % Final  . Neutrophils Relative % 07/23/2020 61  % Final  . Neutro Abs 07/23/2020 6.7  1.7 - 7.7 K/uL Final  . Lymphocytes Relative 07/23/2020 29  % Final  . Lymphs Abs 07/23/2020 3.2  0.7 - 4.0 K/uL Final  . Monocytes Relative 07/23/2020 5  % Final  . Monocytes Absolute 07/23/2020 0.6  0.1 - 1.0 K/uL Final  . Eosinophils Relative 07/23/2020 4  % Final  . Eosinophils Absolute 07/23/2020 0.4  0.0 - 0.5 K/uL Final  . Basophils Relative 07/23/2020 1  % Final  . Basophils Absolute 07/23/2020 0.1  0.0 - 0.1 K/uL Final  . Immature Granulocytes 07/23/2020 0  % Final  . Abs Immature Granulocytes 07/23/2020 0.03  0.00 - 0.07 K/uL Final   Performed at [facility], [place] [address], [place], [place] [address]  . I-stat hCG, quantitative 07/23/2020 <5.0  <5 mIU/mL Final  . Comment 3 07/23/2020          Final   Comment:   GEST. AGE  CONC.  (mIU/mL)   <=1 WEEK        5 - 50     2 WEEKS       50 - 500     3 WEEKS       100 - 10,000     4 WEEKS     1,000 - 30,000        FEMALE AND NON-PREGNANT FEMALE:     LESS THAN 5 mIU/mL   . Lithium Lvl 07/23/2020 0.42* 0.60 - 1.20 mmol/L Final   Performed at [facility], [address], [place], [place] [address]  . Glucose-Capillary 07/24/2020 143* 70 - 99 mg/dL Final   Glucose reference range applies only to samples taken after fasting for at least 8 hours.  Clinical Support on 07/19/2020  Component Date Value Ref Range Status  . Candida Vaginitis 07/19/2020  Positive*  Final  . Candida Glabrata 07/19/2020 Negative   Final  . Comment 07/19/2020 Normal Reference Range Candida Species - Negative   Final  . Comment 07/19/2020 Normal Reference Range Candida Galbrata - Negative   Final  Clinical Support on 07/05/2020  Component Date Value Ref Range Status  . HIV Screen 4th Generation wRfx 07/05/2020 Non Reactive  Non Reactive Final   Comment: HIV Negative HIV-1/HIV-2 antibodies and HIV-1 p24 antigen were NOT detected. There is no laboratory evidence of HIV infection.   . RPR Ser Ql 07/05/2020 Reactive* Non Reactive Final  . Hepatitis B Surface Ag 07/05/2020 Negative  Negative Final  . Hep C Virus Ab 07/05/2020 <0.1  0.0 - 0.9 s/co ratio Final   Comment:                                   Negative:     < 0.8                              Indeterminate: 0.8 - 0.9                                   Positive:     > 0.9  The CDC recommends that a positive HCV antibody result  be followed up with a HCV Nucleic Acid Amplification  test (621308).   . Neisseria Gonorrhea 07/05/2020 Negative   Final  . Chlamydia 07/05/2020 Negative   Final  . Trichomonas 07/05/2020 Negative   Final  . Bacterial Vaginitis (gardnerella) 07/05/2020 Positive*  Final  . Candida Vaginitis 07/05/2020 Positive*  Final  . Candida Glabrata 07/05/2020 Negative   Final  . Comment 07/05/2020 Normal Reference Range Bacterial Vaginosis - Negative   Final  . Comment 07/05/2020 Normal Reference Range Candida Species - Negative   Final  . Comment 07/05/2020 Normal Reference Range Candida Galbrata - Negative   Final  . Comment 07/05/2020 Normal Reference Range Trichomonas - Negative   Final  . Comment 07/05/2020 Normal Reference Ranger Chlamydia - Negative   Final  . Comment 07/05/2020 Normal Reference Range Neisseria Gonorrhea - Negative   Final  . Rapid Plasma Reagin, Quant 07/05/2020 1:16* NonRea<1:1 Final  . T Pallidum Abs 07/05/2020 Reactive* Non Reactive Final  There may be more  visits with results that are not included.    Allergies: Wellbutrin [bupropion], Omnipaque [iohexol], and Penicillin g  PTA Medications: (Not in  a hospital admission)   Medical Decision Making  Patient was medically cleared in the ED. Labs and notes reviewed.   Continue home medications -haldol 10 mg oral QHS for mood stability -Depakote DR 500 mg TID for mood stability -cogentin 1 mg BID for EPS -Levemir 40 units BID for DM -norvasc 5 mg daily for HTN     Recommendations  Based on my evaluation the patient does not appear to have an emergency medical condition.  Jackelyn Poling, NP 09/11/20  5:16 AM

## 2020-09-10 NOTE — ED Notes (Signed)
TTS team called to verify pt's placement on the list.

## 2020-09-10 NOTE — ED Notes (Signed)
Pt resting at this time.

## 2020-09-10 NOTE — ED Notes (Signed)
Pt provided with meal tray.

## 2020-09-10 NOTE — ED Notes (Signed)
Pt changed into burgundy scrubs and wanded by security. Pt belongings collected and placed in cabinets 23-25.   Belongings collected: 1 white shirt, 1 pair of jeans with black belt, 1 pair of black shoes and 1 tan purse.

## 2020-09-10 NOTE — ED Notes (Signed)
Patient cooperative with skin check.  Ambulated independently onto unit.  Oriented to unit and offered food/fluids.  Patient using bathroom.

## 2020-09-11 ENCOUNTER — Encounter (HOSPITAL_COMMUNITY): Payer: Self-pay

## 2020-09-11 ENCOUNTER — Emergency Department (HOSPITAL_COMMUNITY)
Admission: EM | Admit: 2020-09-11 | Discharge: 2020-09-12 | Disposition: A | Discharge status: Home / Self Care | Payer: Medicaid Other | Attending: Emergency Medicine | Admitting: Emergency Medicine

## 2020-09-11 ENCOUNTER — Other Ambulatory Visit: Payer: Self-pay

## 2020-09-11 DIAGNOSIS — T50902A Poisoning by unspecified drugs, medicaments and biological substances, intentional self-harm, initial encounter: Secondary | ICD-10-CM | POA: Diagnosis present

## 2020-09-11 DIAGNOSIS — F4325 Adjustment disorder with mixed disturbance of emotions and conduct: Secondary | ICD-10-CM | POA: Insufficient documentation

## 2020-09-11 DIAGNOSIS — F17210 Nicotine dependence, cigarettes, uncomplicated: Secondary | ICD-10-CM | POA: Diagnosis not present

## 2020-09-11 DIAGNOSIS — T450X2A Poisoning by antiallergic and antiemetic drugs, intentional self-harm, initial encounter: Secondary | ICD-10-CM | POA: Insufficient documentation

## 2020-09-11 DIAGNOSIS — E1165 Type 2 diabetes mellitus with hyperglycemia: Secondary | ICD-10-CM | POA: Insufficient documentation

## 2020-09-11 DIAGNOSIS — T433X2A Poisoning by phenothiazine antipsychotics and neuroleptics, intentional self-harm, initial encounter: Secondary | ICD-10-CM | POA: Diagnosis not present

## 2020-09-11 DIAGNOSIS — I10 Essential (primary) hypertension: Secondary | ICD-10-CM | POA: Diagnosis not present

## 2020-09-11 DIAGNOSIS — X838XXA Intentional self-harm by other specified means, initial encounter: Secondary | ICD-10-CM | POA: Insufficient documentation

## 2020-09-11 DIAGNOSIS — T426X2A Poisoning by other antiepileptic and sedative-hypnotic drugs, intentional self-harm, initial encounter: Secondary | ICD-10-CM | POA: Diagnosis not present

## 2020-09-11 DIAGNOSIS — F259 Schizoaffective disorder, unspecified: Secondary | ICD-10-CM | POA: Diagnosis not present

## 2020-09-11 DIAGNOSIS — Z794 Long term (current) use of insulin: Secondary | ICD-10-CM | POA: Diagnosis not present

## 2020-09-11 DIAGNOSIS — E1142 Type 2 diabetes mellitus with diabetic polyneuropathy: Secondary | ICD-10-CM | POA: Diagnosis not present

## 2020-09-11 DIAGNOSIS — T461X2A Poisoning by calcium-channel blockers, intentional self-harm, initial encounter: Secondary | ICD-10-CM | POA: Insufficient documentation

## 2020-09-11 DIAGNOSIS — Z765 Malingerer [conscious simulation]: Secondary | ICD-10-CM | POA: Diagnosis not present

## 2020-09-11 DIAGNOSIS — T447X2A Poisoning by beta-adrenoreceptor antagonists, intentional self-harm, initial encounter: Secondary | ICD-10-CM | POA: Diagnosis not present

## 2020-09-11 DIAGNOSIS — Z79899 Other long term (current) drug therapy: Secondary | ICD-10-CM | POA: Diagnosis not present

## 2020-09-11 DIAGNOSIS — Z20822 Contact with and (suspected) exposure to covid-19: Secondary | ICD-10-CM | POA: Insufficient documentation

## 2020-09-11 LAB — LITHIUM LEVEL
Lithium Lvl: 0.32 mmol/L — ABNORMAL LOW (ref 0.60–1.20)
Lithium Lvl: 0.59 mmol/L — ABNORMAL LOW (ref 0.60–1.20)
Lithium Lvl: 0.37 mmol/L — ABNORMAL LOW (ref 0.60–1.20)

## 2020-09-11 LAB — CBC WITH DIFFERENTIAL/PLATELET
Abs Immature Granulocytes: 0.03 10*3/uL (ref 0.00–0.07)
Basophils Absolute: 0 10*3/uL (ref 0.0–0.1)
Basophils Relative: 0 %
Eosinophils Absolute: 0.4 10*3/uL (ref 0.0–0.5)
Eosinophils Relative: 5 %
HCT: 36.5 % (ref 36.0–46.0)
Hemoglobin: 11.6 g/dL — ABNORMAL LOW (ref 12.0–15.0)
Immature Granulocytes: 0 %
Lymphocytes Relative: 38 %
Lymphs Abs: 2.9 10*3/uL (ref 0.7–4.0)
MCH: 24.3 pg — ABNORMAL LOW (ref 26.0–34.0)
MCHC: 31.8 g/dL (ref 30.0–36.0)
MCV: 76.4 fL — ABNORMAL LOW (ref 80.0–100.0)
Monocytes Absolute: 0.5 10*3/uL (ref 0.1–1.0)
Monocytes Relative: 7 %
Neutro Abs: 3.8 10*3/uL (ref 1.7–7.7)
Neutrophils Relative %: 50 %
Platelets: 194 10*3/uL (ref 150–400)
RBC: 4.78 MIL/uL (ref 3.87–5.11)
RDW: 15.8 % — ABNORMAL HIGH (ref 11.5–15.5)
WBC: 7.6 10*3/uL (ref 4.0–10.5)
nRBC: 0 % (ref 0.0–0.2)

## 2020-09-11 LAB — COMPREHENSIVE METABOLIC PANEL
ALT: 12 U/L (ref 0–44)
AST: 18 U/L (ref 15–41)
Albumin: 3.9 g/dL (ref 3.5–5.0)
Alkaline Phosphatase: 52 U/L (ref 38–126)
Anion gap: 4 — ABNORMAL LOW (ref 5–15)
BUN: 10 mg/dL (ref 6–20)
CO2: 29 mmol/L (ref 22–32)
Calcium: 9.2 mg/dL (ref 8.9–10.3)
Chloride: 105 mmol/L (ref 98–111)
Creatinine, Ser: 0.85 mg/dL (ref 0.44–1.00)
GFR, Estimated: >60 mL/min (ref >60)
Glucose, Bld: 118 mg/dL — ABNORMAL HIGH (ref 70–99)
Potassium: 4.4 mmol/L (ref 3.5–5.1)
Sodium: 138 mmol/L (ref 135–145)
Total Bilirubin: 0.5 mg/dL (ref 0.3–1.2)
Total Protein: 7.3 g/dL (ref 6.5–8.1)

## 2020-09-11 LAB — RAPID URINE DRUG SCREEN, HOSP PERFORMED
Amphetamines: NOT DETECTED
Barbiturates: POSITIVE — AB
Benzodiazepines: NOT DETECTED
Cocaine: NOT DETECTED
Opiates: NOT DETECTED
Tetrahydrocannabinol: NOT DETECTED

## 2020-09-11 LAB — ACETAMINOPHEN LEVEL: Acetaminophen (Tylenol), Serum: <10 ug/mL — ABNORMAL LOW (ref 10–30)

## 2020-09-11 LAB — I-STAT BETA HCG BLOOD, ED (MC, WL, AP ONLY): I-stat hCG, quantitative: <5 m[IU]/mL (ref <5)

## 2020-09-11 LAB — SALICYLATE LEVEL: Salicylate Lvl: <7 mg/dL — ABNORMAL LOW (ref 7.0–30.0)

## 2020-09-11 LAB — RESP PANEL BY RT-PCR (FLU A&B, COVID) ARPGX2
Influenza A by PCR: NEGATIVE
Influenza B by PCR: NEGATIVE
SARS Coronavirus 2 by RT PCR: NEGATIVE

## 2020-09-11 LAB — CBG MONITORING, ED: Glucose-Capillary: 111 mg/dL — ABNORMAL HIGH (ref 70–99)

## 2020-09-11 LAB — GLUCOSE, CAPILLARY
Glucose-Capillary: 120 mg/dL — ABNORMAL HIGH (ref 70–99)
Glucose-Capillary: 216 mg/dL — ABNORMAL HIGH (ref 70–99)

## 2020-09-11 LAB — MAGNESIUM: Magnesium: 2 mg/dL (ref 1.7–2.4)

## 2020-09-11 LAB — VALPROIC ACID LEVEL
Valproic Acid Lvl: 106 ug/mL — ABNORMAL HIGH (ref 50.0–100.0)
Valproic Acid Lvl: 88 ug/mL (ref 50.0–100.0)

## 2020-09-11 LAB — ETHANOL: Alcohol, Ethyl (B): <10 mg/dL (ref <10)

## 2020-09-11 MED ORDER — INSULIN ASPART 100 UNIT/ML IJ SOLN: 0.0000 [IU] | Freq: Three times a day (TID) | INTRAMUSCULAR | Status: DC

## 2020-09-11 MED ORDER — VITAMIN D 25 MCG (1000 UNIT) PO TABS
1000.0000 [IU] | ORAL_TABLET | Freq: Every day | ORAL | Status: DC
  Administered 2020-09-11: 1000 [IU] via ORAL
  Filled 2020-09-11 (×2): qty 1

## 2020-09-11 MED ORDER — HYDROXYZINE HCL 25 MG PO TABS
25.0000 mg | ORAL_TABLET | Freq: Three times a day (TID) | ORAL | Status: DC | PRN
  Filled 2020-09-11: qty 1

## 2020-09-11 MED ORDER — LORAZEPAM 1 MG PO TABS
1.0000 mg | ORAL_TABLET | Freq: Once | ORAL | Status: AC
  Administered 2020-09-11: 1 mg via ORAL
  Filled 2020-09-11: qty 1

## 2020-09-11 MED ORDER — HALOPERIDOL 5 MG PO TABS
10.0000 mg | ORAL_TABLET | Freq: Every day | ORAL | Status: DC
  Administered 2020-09-11: 10 mg via ORAL
  Filled 2020-09-11: qty 2

## 2020-09-11 MED ORDER — VITAMIN B-12 1000 MCG PO TABS
1000.0000 ug | ORAL_TABLET | Freq: Every day | ORAL | Status: DC
  Filled 2020-09-11: qty 1

## 2020-09-11 MED ORDER — ONDANSETRON HCL 4 MG PO TABS
4.0000 mg | ORAL_TABLET | Freq: Four times a day (QID) | ORAL | Status: DC
  Administered 2020-09-11: 4 mg via ORAL
  Filled 2020-09-11: qty 1

## 2020-09-11 MED ORDER — AMLODIPINE BESYLATE 5 MG PO TABS
5.0000 mg | ORAL_TABLET | Freq: Every day | ORAL | Status: DC
  Administered 2020-09-12: 5 mg via ORAL
  Filled 2020-09-11: qty 1

## 2020-09-11 MED ORDER — SODIUM CHLORIDE 0.9 % IV BOLUS
2000.0000 mL | Freq: Once | INTRAVENOUS | Status: AC
  Administered 2020-09-11: 2000 mL via INTRAVENOUS

## 2020-09-11 MED ORDER — DIVALPROEX SODIUM 500 MG PO DR TAB: 1000.0000 mg | DELAYED_RELEASE_TABLET | Freq: Two times a day (BID) | ORAL | Status: DC

## 2020-09-11 MED ORDER — HYDROXYZINE HCL 25 MG PO TABS: 25.0000 mg | ORAL_TABLET | Freq: Three times a day (TID) | ORAL | Status: DC | PRN

## 2020-09-11 MED ORDER — SODIUM CHLORIDE 0.9 % IV SOLN: Freq: Once | INTRAVENOUS | Status: AC

## 2020-09-11 MED ORDER — HALOPERIDOL 5 MG PO TABS
10.0000 mg | ORAL_TABLET | Freq: Every day | ORAL | Status: DC
  Administered 2020-09-12: 10 mg via ORAL
  Filled 2020-09-11: qty 2

## 2020-09-11 MED ORDER — DIVALPROEX SODIUM 500 MG PO DR TAB
500.0000 mg | DELAYED_RELEASE_TABLET | Freq: Three times a day (TID) | ORAL | Status: DC
  Administered 2020-09-11: 500 mg via ORAL
  Filled 2020-09-11 (×2): qty 1

## 2020-09-11 MED ORDER — BENZTROPINE MESYLATE 1 MG PO TABS
1.0000 mg | ORAL_TABLET | Freq: Two times a day (BID) | ORAL | Status: DC
  Administered 2020-09-11 (×2): 1 mg via ORAL
  Filled 2020-09-11 (×2): qty 1

## 2020-09-11 MED ORDER — AMLODIPINE BESYLATE 5 MG PO TABS
5.0000 mg | ORAL_TABLET | Freq: Every day | ORAL | Status: DC
  Administered 2020-09-11: 5 mg via ORAL
  Filled 2020-09-11: qty 1

## 2020-09-11 MED ORDER — ONDANSETRON HCL 4 MG PO TABS
4.0000 mg | ORAL_TABLET | Freq: Four times a day (QID) | ORAL | Status: DC
  Administered 2020-09-12 (×2): 4 mg via ORAL
  Filled 2020-09-11 (×2): qty 1

## 2020-09-11 MED ORDER — BENZTROPINE MESYLATE 1 MG PO TABS
1.0000 mg | ORAL_TABLET | Freq: Two times a day (BID) | ORAL | Status: DC
  Administered 2020-09-12 (×2): 1 mg via ORAL
  Filled 2020-09-11 (×2): qty 1

## 2020-09-11 MED ORDER — INSULIN DETEMIR 100 UNIT/ML ~~LOC~~ SOLN
40.0000 [IU] | Freq: Two times a day (BID) | SUBCUTANEOUS | Status: DC
  Administered 2020-09-11 (×2): 40 [IU] via SUBCUTANEOUS

## 2020-09-11 MED ORDER — VITAMIN D3 25 MCG (1000 UNIT) PO TABS
1000.0000 [IU] | ORAL_TABLET | Freq: Every day | ORAL | Status: DC
  Administered 2020-09-12: 1000 [IU] via ORAL
  Filled 2020-09-11 (×2): qty 1

## 2020-09-11 MED ORDER — DIVALPROEX SODIUM 500 MG PO DR TAB
1000.0000 mg | DELAYED_RELEASE_TABLET | Freq: Two times a day (BID) | ORAL | Status: DC
  Administered 2020-09-11: 1000 mg via ORAL
  Filled 2020-09-11: qty 8
  Filled 2020-09-11: qty 4

## 2020-09-11 MED ORDER — INSULIN ASPART 100 UNIT/ML IJ SOLN: 0.0000 [IU] | Freq: Every day | INTRAMUSCULAR | Status: DC

## 2020-09-11 MED ORDER — DIVALPROEX SODIUM 500 MG PO DR TAB
1000.0000 mg | DELAYED_RELEASE_TABLET | Freq: Two times a day (BID) | ORAL | Status: DC
  Administered 2020-09-12 (×2): 1000 mg via ORAL
  Filled 2020-09-11 (×2): qty 2

## 2020-09-11 MED ORDER — VITAMIN B-12 1000 MCG PO TABS
1000.0000 ug | ORAL_TABLET | Freq: Every day | ORAL | Status: DC
  Administered 2020-09-11: 1000 ug via ORAL
  Filled 2020-09-11: qty 1

## 2020-09-11 MED ORDER — INSULIN DETEMIR 100 UNIT/ML ~~LOC~~ SOLN
40.0000 [IU] | Freq: Two times a day (BID) | SUBCUTANEOUS | Status: DC
  Administered 2020-09-12 (×2): 40 [IU] via SUBCUTANEOUS
  Filled 2020-09-11 (×2): qty 0.4

## 2020-09-11 NOTE — ED Provider Notes (Signed)
**Stacy Stacy** Stacy Stacy   CSN: [id number] Arrival date & time: 09/10/20  1234     History Chief Complaint  Patient presents with  . Suicidal    SI     Stacy Stacy is a 30 y.o. female.  Presenting to the emergency room with concern for suicidal ideation.  Patient reports that she has been undergoing some stressful situations with family.  States that this is caused increased stress and anxiety.  States that she has had increase in suicidal thoughts.  Has not taken any action today to harm herself.  Denies auditory or visual hallucinations.  She denies any acute medical complaints at present.  Has history of bipolar disorder, anxiety, depression, diabetes, obesity.  HPI     Past Medical History:  Diagnosis Date  . Anxiety   . Bipolar 1 disorder ([place])   . Cognitive deficits   . Depression   . Diabetes mellitus without complication ([place])   . Hypertension   . Mental disorder   . Mental health disorder   . Obesity     Patient Active Problem List   Diagnosis Date Noted  . Syphilis 07/15/2020  . Malingering 06/05/2020  . Gastroesophageal reflux disease 05/04/2020  . Hyperglycemia due to type 2 diabetes mellitus ([place]) 05/04/2020  . Long term (current) use of insulin ([place]) 05/04/2020  . Migraine without aura 05/04/2020  . Morbid obesity ([place]) 05/04/2020  . 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 2 diabetes mellitus ([place]) 05/04/2020  . Prolapsed internal hemorrhoids 05/04/2020  . Vitamin D deficiency 05/04/2020  . Other symptoms and signs involving cognitive functions and awareness 05/04/2020  . Suicide attempt ([place])   . Anxiety state 03/06/2020  . Schizophrenia ([place]) 09/13/2019  . Bipolar I disorder, most recent episode depressed ([place]) 06/23/2019  . MDD (major depressive disorder) 10/10/2018  . Schizoaffective disorder, bipolar type ([place]) 09/25/2018  . Affective psychosis, bipolar ([place]) 06/13/2018  . HTN (hypertension) 05/03/2018  .  Tobacco use disorder 05/03/2018  . Adjustment disorder with emotional disturbance 01/02/2018  . 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 Moderate bipolar I disorder, most recent episode depressed ([place])   . Psychosis ([place])   . 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 OCD (obsessive compulsive disorder) 10/05/2016  . Major depressive disorder, recurrent episode, mild ([place]) 05/04/2016  . 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 Learning disability 07/18/2015  . 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 Diabetes mellitus ([place]) 07/18/2015  . 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 Hyperlipidemia 07/18/2015  . Severe episode of recurrent major depressive disorder, without psychotic features ([place])   . Suicidal ideation   . Drug overdose   . Cognitive deficits 10/12/2012  . Generalized anxiety disorder 06/28/2012    Past Surgical History:  Procedure Laterality Date  . CESAREAN SECTION    . CESAREAN SECTION N/A 04/25/2013   Procedure: REPEAT CESAREAN SECTION;  Surgeon: [name], MD;  Location: [facility];  Service: Obstetrics;  Laterality: N/A;  . MASS EXCISION N/A 06/03/2012   Procedure: EXCISION MASS;  Surgeon: [name], MD;  Location: [facility];  Service: ENT;  Laterality: N/A;  Excision uvula mass  . TONSILLECTOMY N/A 06/03/2012   Procedure: TONSILLECTOMY;  Surgeon: [name], MD;  Location: [facility];  Service: ENT;  Laterality: N/A;  . TONSILLECTOMY       OB History    Gravida  3  Para  3   Term  3   Preterm  0   AB  0   Living  3     SAB  0   IAB  0   Ectopic  0   Multiple      Live Births  3           Family History  Problem Relation Age of Onset  . Hypertension Mother   . Diabetes Father     Social History   Tobacco Use  . Smoking status: Current Every Day Smoker     Packs/day: 4.00    Years: 11.00    Pack years: 44.00    Types: Cigarettes, Cigars  . Smokeless tobacco: Never Used  . Tobacco comment: Pt declined  Vaping Use  . Vaping Use: Never used  Substance Use Topics  . Alcohol use: Not Currently    Comment: occ: last intake 4 mts ago  . Drug use: Not Currently    Types: "Crack" cocaine, Other-see comments    Comment: Patient reports hx of smoking Crack    Home Medications Prior to Admission medications   Medication Sig Start Date End Date Taking? Authorizing Provider  amLODipine (NORVASC) 5 MG tablet Take 5 mg by mouth daily.    [provider]  benztropine (COGENTIN) 1 MG tablet Take 1 mg by mouth 2 (two) times daily. 05/01/20   [provider]  cholecalciferol (VITAMIN D) 25 MCG (1000 UNIT) tablet Take 1,000 Units by mouth daily. 04/30/20   [provider]  divalproex (DEPAKOTE) 500 MG DR tablet Take 2 tablets (1,000 mg total) by mouth every 12 (twelve) hours. 09/11/20   Money, Lowry Ram, FNP  haloperidol (HALDOL) 10 MG tablet Take 10 mg by mouth at bedtime. 05/09/20   [provider]  haloperidol decanoate (HALDOL DECANOATE) 50 MG/ML injection Inject 50 mg into the muscle every 28 (twenty-eight) days. 03/19/20   [provider]  hydrOXYzine (ATARAX/VISTARIL) 25 MG tablet Take 1 tablet (25 mg total) by mouth 3 (three) times daily as needed for anxiety. 05/27/20   Derrill Center, NP  insulin detemir (LEVEMIR) 100 UNIT/ML injection Inject 0.4 mLs (40 Units total) into the skin 2 (two) times daily. 05/05/20   Montine Circle, PA-C  ondansetron (ZOFRAN) 4 MG tablet Take 1 tablet (4 mg total) by mouth every 6 (six) hours. 09/07/20   Peri Jefferson, PA-C  vitamin B-12 (CYANOCOBALAMIN) 1000 MCG tablet Take 1,000 mcg by mouth daily. 11/13/19   [provider]    Allergies    Wellbutrin [bupropion], Omnipaque [iohexol], and Penicillin g  Review of Systems   Review of Systems  Constitutional: Negative  for chills and fever.  HENT: Negative for ear pain and sore throat.   Eyes: Negative for pain and visual disturbance.  Respiratory: Negative for cough and shortness of breath.   Cardiovascular: Negative for chest pain and palpitations.  Gastrointestinal: Negative for abdominal pain and vomiting.  Genitourinary: Negative for dysuria and hematuria.  Musculoskeletal: Negative for arthralgias and back pain.  Skin: Negative for color change and rash.  Neurological: Negative for seizures and syncope.  Psychiatric/Behavioral: Positive for behavioral problems and dysphoric mood.  All other systems reviewed and are negative.   Physical Exam Updated Vital Signs BP (!) 145/81   Pulse 82   Temp 98.8 F (37.1 C)   Resp 16   Ht 5\' 7"  (1.702 m)   Wt 115.2 kg   LMP 08/16/2020 (Approximate)   SpO2 100%  BMI 39.78 kg/m   Physical Exam Vitals and nursing Stacy reviewed.  Constitutional:      General: She is not in acute distress.    Appearance: She is well-developed. She is obese.  HENT:     Head: Normocephalic and atraumatic.  Eyes:     Conjunctiva/sclera: Conjunctivae normal.  Cardiovascular:     Rate and Rhythm: Normal rate and regular rhythm.     Heart sounds: No murmur heard.   Pulmonary:     Effort: Pulmonary effort is normal. No respiratory distress.     Breath sounds: Normal breath sounds.  Abdominal:     Palpations: Abdomen is soft.     Tenderness: There is no abdominal tenderness.  Musculoskeletal:     Cervical back: Neck supple.  Skin:    General: Skin is warm and dry.  Neurological:     General: No focal deficit present.     Mental Status: She is alert.   Psychiatric: Calm, cooperative  ED Results / Procedures / Treatments   Labs (all labs ordered are listed, but only abnormal results are displayed) Labs Reviewed  CBC WITH DIFFERENTIAL/PLATELET - Abnormal; Notable for the following components:      Result Value   Hemoglobin 11.5 (*)    MCV 76.5 (*)    MCH  24.1 (*)    RDW 15.8 (*)    All other components within normal limits  COMPREHENSIVE METABOLIC PANEL - Abnormal; Notable for the following components:   Glucose, Bld 123 (*)    All other components within normal limits  RAPID URINE DRUG SCREEN, HOSP PERFORMED - Abnormal; Notable for the following components:   Barbiturates POSITIVE (*)    All other components within normal limits  RESP PANEL BY RT-PCR (FLU A&B, COVID) ARPGX2  I-STAT BETA HCG BLOOD, ED ([facility], [facility], [facility] ONLY)    EKG None  Radiology No results found.  Procedures Procedures   Medications Ordered in ED Medications - No data to display  ED Course  I have reviewed the triage vital signs and the nursing notes.  Pertinent labs & imaging results that were available during my care of the patient were reviewed by me and considered in my medical decision making (see chart for details).    MDM Rules/Calculators/A&P                          30 year old lady presenting to the ER with concern for suicidal ideation.  On exam patient is well-appearing in no distress.  She denies any acute medical complaints.  Basic labs are stable.  She is medically stable for further psychiatric evaluation.  I have placed consult to TTS.  At time of signout, TTS eval pending.  She is currently voluntary.   Final Clinical Impression(s) / ED Diagnoses Final diagnoses:  None    Rx / DC Orders ED Discharge Orders    None       Lucrezia Starch, MD 09/11/20 2117

## 2020-09-11 NOTE — ED Notes (Signed)
Patient sleeping. RR even and unlabored.

## 2020-09-11 NOTE — ED Provider Notes (Signed)
FBC/OBS ASAP Discharge Summary  Date and Time: 09/11/2020 10:38 AM  Name: [name]  MRN:  [id number]   Discharge Diagnoses:  Final diagnoses:  Schizoaffective disorder, bipolar type ([place])    Subjective: Patient reports this morning that she is doing good.  Patient states that she started getting angry at people and would like to have her medications adjusted.  Discussed with patient her Depakote and she reports that she feels that it has helped her some since it was started on her.  She states that her ACT team has continued it for her.  She denies any suicidal or homicidal ideations and denies any hallucinations.  Patient reports that she would like to have the Depakote increased.  Patient request that I contact her ACT team with [facility]. Contacted [name] and discussed with him increasing the patient's Depakote 1000 mg p.o. twice daily from 500 p.o. 3 times daily.  He states that that would be a very good plan for this patient.  He states that they have done her best to try to keep the patient away from the emergency department and that she has frequently contacted them wanting to move from location to location to location.  He is requesting for her to have 2-day samples to give him time to get her new prescription filled for Depakote 1000 mg p.o. twice daily.  He reports he has no safety concerns with the patient discharging home and he will have his team follow up with the patient.  Stay Summary: Patient is a 30 year old female with a known diagnosis of schizophrenia who presented to [facility] ED reporting dizziness and then reported suicidal ideations and was transferred to the [facility] for overnight observation.  Patient presented to the [facility] pleasant, calm, cooperative denying any suicidal or homicidal ideations but did report hearing a voice earlier in the day.  Patient was admitted to the continuous observation unit for overnight assessment.  It was reported that the patient  actually requested to discharge home last night however there was no transportation services available.  Patient was restarted on her home medications and this morning denied any suicidal or homicidal ideations and denied any hallucinations.  Safety plan and medication adjustments were made and discussed with patient's [facility] team.  They reported they would follow up with the patient.  Patient was provided with transportation via safe transport back to her residence.  Patient was provided with a 2-day samples of Depakote 1000 mg p.o. twice daily to give her ACT team time to get her new prescription filled.  [name] was contacted and notified of the plan as well as medication changes.  Total Time spent with patient: 30 minutes  Past Psychiatric History: Bipolar 1 disorder, anxiety, MDD, numerous hospitalizations, previous suicide attempts Past Medical History:  Past Medical History:  Diagnosis Date  . Anxiety   . Bipolar 1 disorder ([place])   . Cognitive deficits   . Depression   . Diabetes mellitus without complication ([place])   . Hypertension   . Mental disorder   . Mental health disorder   . Obesity     Past Surgical History:  Procedure Laterality Date  . CESAREAN SECTION    . CESAREAN SECTION N/A 04/25/2013   Procedure: REPEAT CESAREAN SECTION;  Surgeon: [name], MD;  Location: [facility];  Service: Obstetrics;  Laterality: N/A;  . MASS EXCISION N/A 06/03/2012   Procedure: EXCISION MASS;  Surgeon: [name], MD;  Location: [facility];  Service: ENT;  Laterality: N/A;  Excision uvula mass  . TONSILLECTOMY N/A 06/03/2012   Procedure: TONSILLECTOMY;  Surgeon: [name], MD;  Location: [facility];  Service: ENT;  Laterality: N/A;  . TONSILLECTOMY     Family History:  Family History  Problem Relation Age of Onset  . Hypertension Mother   . Diabetes Father    Family Psychiatric History: None reported Social History:  Social  History   Substance and Sexual Activity  Alcohol Use Not Currently   Comment: occ: last intake 4 mts ago     Social History   Substance and Sexual Activity  Drug Use Not Currently  . Types: "Crack" cocaine, Other-see comments   Comment: Patient reports hx of smoking Crack    Social History   Socioeconomic History  . Marital status: Single    Spouse name: Not on file  . Number of children: Not on file  . Years of education: Not on file  . Highest education level: Not on file  Occupational History  . Not on file  Tobacco Use  . Smoking status: Current Every Day Smoker    Packs/day: 4.00    Years: 11.00    Pack years: 44.00    Types: Cigarettes, Cigars  . Smokeless tobacco: Never Used  . Tobacco comment: Pt declined  Vaping Use  . Vaping Use: Never used  Substance and Sexual Activity  . Alcohol use: Not Currently    Comment: occ: last intake 4 mts ago  . Drug use: Not Currently    Types: "Crack" cocaine, Other-see comments    Comment: Patient reports hx of smoking Crack  . Sexual activity: Yes    Birth control/protection: Implant  Other Topics Concern  . Not on file  Social History Narrative   ** Merged History Encounter **       Social Determinants of Health   Financial Resource Strain: Not on file  Food Insecurity: Not on file  Transportation Needs: Not on file  Physical Activity: Not on file  Stress: Not on file  Social Connections: Not on file   SDOH:  SDOH Screenings   Alcohol Screen: Low Risk   . Last Alcohol Screening Score (AUDIT): 3  Depression (PHQ2-9): Low Risk   . PHQ-2 Score: 4  Financial Resource Strain: Not on file  Food Insecurity: Not on file  Housing: Not on file  Physical Activity: Not on file  Social Connections: Not on file  Stress: Not on file  Tobacco Use: High Risk  . Smoking Tobacco Use: Current Every Day Smoker  . Smokeless Tobacco Use: Never Used  Transportation Needs: Not on file    Has this patient used any form of  tobacco in the last 30 days? (Cigarettes, Smokeless Tobacco, Cigars, and/or Pipes) A prescription for an FDA-approved tobacco cessation medication was offered at discharge and the patient refused  Current Medications:  Current Facility-Administered Medications  Medication Dose Route Frequency Provider Last Rate Last Admin  . acetaminophen (TYLENOL) tablet 650 mg  650 mg Oral Q6H PRN Lindon Romp A, NP      . alum & mag hydroxide-simeth (MAALOX/MYLANTA) 200-200-20 MG/5ML suspension 30 mL  30 mL Oral Q4H PRN Lindon Romp A, NP      . amLODipine (NORVASC) tablet 5 mg  5 mg Oral Daily Lindon Romp A, NP      . benztropine (COGENTIN) tablet 1 mg  1 mg Oral BID Lindon Romp A, NP   1 mg at 09/11/20 0018  .  cholecalciferol (VITAMIN D3) tablet 1,000 Units  1,000 Units Oral Daily Lindon Romp A, NP      . divalproex (DEPAKOTE) DR tablet 1,000 mg  1,000 mg Oral Q12H Maira Christon, Lowry Ram, FNP      . haloperidol (HALDOL) tablet 10 mg  10 mg Oral QHS Lindon Romp A, NP   10 mg at 09/11/20 0017  . hydrOXYzine (ATARAX/VISTARIL) tablet 25 mg  25 mg Oral TID PRN Rozetta Nunnery, NP      . insulin aspart (novoLOG) injection 0-5 Units  0-5 Units Subcutaneous QHS Lindon Romp A, NP      . insulin aspart (novoLOG) injection 0-6 Units  0-6 Units Subcutaneous TID WC Lindon Romp A, NP      . insulin detemir (LEVEMIR) injection 40 Units  40 Units Subcutaneous BID Lindon Romp A, NP   40 Units at 09/11/20 0015  . magnesium hydroxide (MILK OF MAGNESIA) suspension 30 mL  30 mL Oral Daily PRN Lindon Romp A, NP      . vitamin B-12 (CYANOCOBALAMIN) tablet 1,000 mcg  1,000 mcg Oral Daily Rozetta Nunnery, NP       Current Outpatient Medications  Medication Sig Dispense Refill  . amLODipine (NORVASC) 5 MG tablet Take 5 mg by mouth daily.    . benztropine (COGENTIN) 1 MG tablet Take 1 mg by mouth 2 (two) times daily.    . cholecalciferol (VITAMIN D) 25 MCG (1000 UNIT) tablet Take 1,000 Units by mouth daily.    . divalproex  (DEPAKOTE) 500 MG DR tablet Take 2 tablets (1,000 mg total) by mouth every 12 (twelve) hours.    . haloperidol (HALDOL) 10 MG tablet Take 10 mg by mouth at bedtime.    . haloperidol decanoate (HALDOL DECANOATE) 50 MG/ML injection Inject 50 mg into the muscle every 28 (twenty-eight) days.    . hydrOXYzine (ATARAX/VISTARIL) 25 MG tablet Take 1 tablet (25 mg total) by mouth 3 (three) times daily as needed for anxiety. 30 tablet 0  . insulin detemir (LEVEMIR) 100 UNIT/ML injection Inject 0.4 mLs (40 Units total) into the skin 2 (two) times daily. 10 mL 11  . ondansetron (ZOFRAN) 4 MG tablet Take 1 tablet (4 mg total) by mouth every 6 (six) hours. 12 tablet 0  . vitamin B-12 (CYANOCOBALAMIN) 1000 MCG tablet Take 1,000 mcg by mouth daily.      PTA Medications: (Not in a hospital admission)   Musculoskeletal  Strength & Muscle Tone: within normal limits Gait & Station: normal Patient leans: N/A  Psychiatric Specialty Exam  Presentation  General Appearance: Appropriate for Environment; Casual  Eye Contact:Good  Speech:Clear and Coherent; Normal Rate  Speech Volume:Normal  Handedness:Right   Mood and Affect  Mood:Euthymic  Affect:Appropriate; Congruent   Thought Process  Thought Processes:Coherent  Descriptions of Associations:Intact  Orientation:Full (Time, Place and Person)  Thought Content:WDL  Diagnosis of Schizophrenia or Schizoaffective disorder in past: Yes  Duration of Psychotic Symptoms: Greater than six months   Hallucinations:Hallucinations: None  Ideas of Reference:None  Suicidal Thoughts:Suicidal Thoughts: No  Homicidal Thoughts:Homicidal Thoughts: No   Sensorium  Memory:Immediate Good; Recent Good; Remote Good  Judgment:Fair  Insight:Fair   Executive Functions  Concentration:Good  Attention Span:Good  Recall:Good  Fund of Knowledge:Fair  Language:Good   Psychomotor Activity  Psychomotor Activity:Psychomotor Activity: Normal   Assets   Assets:Communication Skills; Desire for Improvement; Financial Resources/Insurance; Web designer; Social Support   Sleep  Sleep:Sleep: Good   Nutritional Assessment (For OBS and [facility] admissions only) Has  the patient had a weight loss or gain of 10 pounds or more in the last 3 months?: No Has the patient had a decrease in food intake/or appetite?: No Does the patient have dental problems?: No Does the patient have eating habits or behaviors that may be indicators of an eating disorder including binging or inducing vomiting?: No Has the patient recently lost weight without trying?: No Has the patient been eating poorly because of a decreased appetite?: No Malnutrition Screening Tool Score: 0    Physical Exam  Physical Exam Vitals and nursing note reviewed.  Constitutional:      Appearance: She is well-developed.  HENT:     Head: Normocephalic.  Eyes:     Pupils: Pupils are equal, round, and reactive to light.  Cardiovascular:     Rate and Rhythm: Normal rate.  Pulmonary:     Effort: Pulmonary effort is normal.  Musculoskeletal:        General: Normal range of motion.  Neurological:     Mental Status: She is alert and oriented to person, place, and time.    Review of Systems  Constitutional: Negative.   HENT: Negative.   Eyes: Negative.   Respiratory: Negative.   Cardiovascular: Negative.   Gastrointestinal: Negative.   Genitourinary: Negative.   Musculoskeletal: Negative.   Skin: Negative.   Neurological: Negative.   Endo/Heme/Allergies: Negative.   Psychiatric/Behavioral: Negative.    Blood pressure (!) 132/94, pulse 84, temperature 97.8 F (36.6 C), temperature source Tympanic, resp. rate 16, last menstrual period 08/16/2020, SpO2 100 %. There is no height or weight on file to calculate BMI.  Demographic Factors:  Low socioeconomic status and Living alone  Loss Factors: NA  Historical Factors: Prior suicide attempts and Impulsivity  Risk  Reduction Factors:   Positive social support, Positive therapeutic relationship and Positive coping skills or problem solving skills  Continued Clinical Symptoms:  Previous Psychiatric Diagnoses and Treatments  Cognitive Features That Contribute To Risk:  None    Suicide Risk:  Mild:  Suicidal ideation of limited frequency, intensity, duration, and specificity.  There are no identifiable plans, no associated intent, mild dysphoria and related symptoms, good self-control (both objective and subjective assessment), few other risk factors, and identifiable protective factors, including available and accessible social support.  Plan Of Care/Follow-up recommendations:  Continue activity as tolerated. Continue diet as recommended by your PCP. Ensure to keep all appointments with outpatient providers.  Disposition: Discharge home follow up with [facility]  Maryfrances Bunnell, FNP 09/11/2020, 10:38 AM

## 2020-09-11 NOTE — Discharge Instructions (Signed)

## 2020-09-11 NOTE — Progress Notes (Signed)
Pt is asleep. R/R are even and unlabored. No signs of acute distress noted. Staff will monitor for pt's safety.

## 2020-09-11 NOTE — ED Notes (Signed)
Patient up to bathroom

## 2020-09-11 NOTE — Progress Notes (Signed)
Tried to draw labs multiple times but was unsuccessful. Darnelle Maffucci NP made aware via secure chat and in person.

## 2020-09-11 NOTE — ED Notes (Signed)
Pt given breakfast.

## 2020-09-11 NOTE — ED Triage Notes (Signed)
SI, Admits to taking 6x perphenanize, 2x propanolol, 2x simvastatin, 4x depakote, 4x hydroxyzine, 4x pyrimidine, 4x lithium around 1340. wants placed in group home and different meds that will help her better then current.

## 2020-09-11 NOTE — ED Provider Notes (Signed)
[facility] DEPT Provider Note   CSN: [id number] Arrival date & time: 09/11/20  1435     History Chief Complaint  Patient presents with  . Drug Overdose    [name] is a 30 y.o. female who presents to the emergency department over intentional overdose.  She has a past medical history of bipolar disorder, intellectual disability, obesity, diabetes, schizoaffective disorder with psychosis.  Patient states that she took the medications because she wanted to die because "of my life."  The ED triage note reports that the patient took 6 perphenazine, 2 propranolol, 2 simvastatin, for Depakote, for hydroxyzine, for pyrimidine, and for lithium's.  The patient states that that is not what she took and reports that she only took 3 of her lithium which is approximately 900 mg, 2-3 of her simvastatin's and 4-5 of her perphenazine's.  Case discussed with [facility] poison control.  They recommend a 4-hour Tylenol level, serial Depakote and lithium levels every 4 hours until they peak and decline, fluid hydration, evaluation for QT prolongation, optimization of magnesium and potassium if it is elongated and serial EKGs. She feels a little light headed and dizzy but has no other complaints.   HPI     Past Medical History:  Diagnosis Date  . Anxiety   . Bipolar 1 disorder ([place])   . Cognitive deficits   . Depression   . Diabetes mellitus without complication ([place])   . Hypertension   . Mental disorder   . Mental health disorder   . Obesity     Patient Active Problem List   Diagnosis Date Noted  . Syphilis 07/15/2020  . Malingering 06/05/2020  . Gastroesophageal reflux disease 05/04/2020  . Hyperglycemia due to type 2 diabetes mellitus ([place]) 05/04/2020  . Long term (current) use of insulin ([place]) 05/04/2020  . Migraine without aura 05/04/2020  . Morbid obesity ([place]) 05/04/2020  . 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 2 diabetes mellitus ([place]) 05/04/2020  .  Prolapsed internal hemorrhoids 05/04/2020  . Vitamin D deficiency 05/04/2020  . Other symptoms and signs involving cognitive functions and awareness 05/04/2020  . Suicide attempt ([place])   . Anxiety state 03/06/2020  . Schizophrenia ([place]) 09/13/2019  . Bipolar I disorder, most recent episode depressed ([place]) 06/23/2019  . MDD (major depressive disorder) 10/10/2018  . Schizoaffective disorder, bipolar type ([place]) 09/25/2018  . Affective psychosis, bipolar ([place]) 06/13/2018  . HTN (hypertension) 05/03/2018  . Tobacco use disorder 05/03/2018  . Adjustment disorder with emotional disturbance 01/02/2018  . 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 Moderate bipolar I disorder, most recent episode depressed ([place])   . Psychosis ([place])   . 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 OCD (obsessive compulsive disorder) 10/05/2016  . Major depressive disorder, recurrent episode, mild ([place]) 05/04/2016  . 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 Learning disability 07/18/2015  . 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 Diabetes mellitus ([place]) 07/18/2015  . 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 Hyperlipidemia 07/18/2015  . Severe episode of recurrent major depressive disorder, without psychotic features ([place])   . Suicidal ideation   . Drug overdose   . Cognitive deficits 10/12/2012  . Generalized anxiety disorder 06/28/2012    Past Surgical History:  Procedure Laterality Date  . CESAREAN SECTION    . CESAREAN SECTION N/A 04/25/2013   Procedure: REPEAT CESAREAN SECTION;  Surgeon: [name], MD;  Location: [facility];  Service:  Obstetrics;  Laterality: N/A;  . MASS EXCISION N/A 06/03/2012   Procedure: EXCISION MASS;  Surgeon: [name], MD;  Location: [facility];  Service: ENT;  Laterality: N/A;  Excision uvula mass  . TONSILLECTOMY N/A 06/03/2012    Procedure: TONSILLECTOMY;  Surgeon: [name], MD;  Location: [facility];  Service: ENT;  Laterality: N/A;  . TONSILLECTOMY       OB History    Gravida  3   Para  3   Term  3   Preterm  0   AB  0   Living  3     SAB  0   IAB  0   Ectopic  0   Multiple      Live Births  3           Family History  Problem Relation Age of [place]  . Hypertension Mother   . Diabetes Father     Social History   Tobacco Use  . Smoking status: Current Every Day Smoker    Packs/day: 4.00    Years: 11.00    Pack years: 44.00    Types: Cigarettes, Cigars  . Smokeless tobacco: Never Used  . Tobacco comment: Pt declined  Vaping Use  . Vaping Use: Never used  Substance Use Topics  . Alcohol use: Not Currently    Comment: occ: last intake 4 mts ago  . Drug use: Not Currently    Types: "Crack" cocaine, Other-see comments    Comment: Patient reports hx of smoking Crack    Home Medications Prior to Admission medications   Medication Sig Start Date End Date Taking? Authorizing Provider  insulin detemir (LEVEMIR) 100 UNIT/ML injection Inject 0.4 mLs (40 Units total) into the skin 2 (two) times daily. 05/05/20  Yes Montine Circle, PA-C  amLODipine (NORVASC) 5 MG tablet Take 5 mg by mouth daily.    [provider]  benztropine (COGENTIN) 1 MG tablet Take 1 mg by mouth 2 (two) times daily. 05/01/20   [provider]  cholecalciferol (VITAMIN D) 25 MCG (1000 UNIT) tablet Take 1,000 Units by mouth daily. 04/30/20   [provider]  divalproex (DEPAKOTE) 500 MG DR tablet Take 2 tablets (1,000 mg total) by mouth every 12 (twelve) hours. 09/11/20   Money, Lowry Ram, FNP  haloperidol (HALDOL) 10 MG tablet Take 10 mg by mouth at bedtime. 05/09/20   [provider]  haloperidol decanoate (HALDOL DECANOATE) 50 MG/ML injection Inject 50 mg into the muscle every 28 (twenty-eight) days. 03/19/20   [provider]  hydrOXYzine  (ATARAX/VISTARIL) 25 MG tablet Take 1 tablet (25 mg total) by mouth 3 (three) times daily as needed for anxiety. 05/27/20   Derrill Center, NP  ondansetron (ZOFRAN) 4 MG tablet Take 1 tablet (4 mg total) by mouth every 6 (six) hours. 09/07/20   Peri Jefferson, PA-C  vitamin B-12 (CYANOCOBALAMIN) 1000 MCG tablet Take 1,000 mcg by mouth daily. 11/13/19   [provider]    Allergies    Wellbutrin [bupropion], Omnipaque [iohexol], and Penicillin g  Review of Systems   Review of Systems Ten systems reviewed and are negative for acute change, except as noted in the HPI.   Physical Exam Updated Vital Signs BP (!) 150/111 (BP Location: Left Arm)   Pulse 70   Temp 98.6 F (37 C) (Oral)   Resp 20   LMP 08/16/2020 (Approximate)   SpO2 100%   Physical Exam Vitals and nursing note reviewed.  Constitutional:      General: She is not in acute distress.    Appearance: She is well-developed. She is not diaphoretic.  HENT:     Head: Normocephalic and atraumatic.  Eyes:     General: No scleral icterus.    Conjunctiva/sclera: Conjunctivae normal.  Cardiovascular:     Rate and Rhythm: Normal rate and regular rhythm.     Heart sounds: Normal heart sounds. No murmur heard. No friction rub. No gallop.   Pulmonary:     Effort: Pulmonary effort is normal. No respiratory distress.     Breath sounds: Normal breath sounds.  Abdominal:     General: Bowel sounds are normal. There is no distension.     Palpations: Abdomen is soft. There is no mass.     Tenderness: There is no abdominal tenderness. There is no guarding.  Musculoskeletal:     Cervical back: Normal range of motion.  Skin:    General: Skin is warm and dry.  Neurological:     Mental Status: She is alert and oriented to person, place, and time.  Psychiatric:        Behavior: Behavior normal.     ED Results / Procedures / Treatments   Labs (all labs ordered are listed, but only abnormal results are displayed) Labs Reviewed   COMPREHENSIVE METABOLIC PANEL - Abnormal; Notable for the following components:      Result Value   Glucose, Bld 118 (*)    Anion gap 4 (*)    All other components within normal limits  SALICYLATE LEVEL - Abnormal; Notable for the following components:   Salicylate Lvl [id number] (*)    All other components within normal limits  ACETAMINOPHEN LEVEL - Abnormal; Notable for the following components:   Acetaminophen (Tylenol), Serum <10 (*)    All other components within normal limits  RAPID URINE DRUG SCREEN, HOSP PERFORMED - Abnormal; Notable for the following components:   Barbiturates POSITIVE (*)    All other components within normal limits  CBC WITH DIFFERENTIAL/PLATELET - Abnormal; Notable for the following components:   Hemoglobin 11.6 (*)    MCV 76.4 (*)    MCH 24.3 (*)    RDW 15.8 (*)    All other components within normal limits  LITHIUM LEVEL - Abnormal; Notable for the following components:   Lithium Lvl 0.32 (*)    All other components within normal limits  VALPROIC ACID LEVEL - Abnormal; Notable for the following components:   Valproic Acid Lvl 106 (*)    All other components within normal limits  LITHIUM LEVEL - Abnormal; Notable for the following components:   Lithium Lvl 0.59 (*)    All other components within normal limits  LITHIUM LEVEL - Abnormal; Notable for the following components:   Lithium Lvl 0.37 (*)    All other components within normal limits  CBG MONITORING, ED - Abnormal; Notable for the following components:   Glucose-Capillary 111 (*)    All other components within normal limits  RESP PANEL BY RT-PCR (FLU A&B, COVID) ARPGX2  ETHANOL  MAGNESIUM  VALPROIC ACID LEVEL  I-STAT BETA HCG BLOOD, ED ([facility], [facility], [facility] ONLY)    EKG None  Radiology No results found.  Procedures Procedures   Medications Ordered in ED Medications  sodium chloride 0.9 % bolus 2,000 mL (2,000 mLs Intravenous New Bag/Given 09/11/20 2050)  0.9 %  sodium chloride infusion (  Intravenous New Bag/Given 09/11/20 2050)    ED Course  I have reviewed  the triage vital signs and the nursing notes.  Pertinent labs & imaging results that were available during my care of the patient were reviewed by me and considered in my medical decision making (see chart for details).  Clinical Course as of 09/11/20 2340  Wed Sep 11, 2020  1551 Ekg 4 hours tylenol Depakote level and lithium level Theses need to trended q 4 hours until they peak and decline.  Fluid hydration- Normal saline Eval for QT c prolongation - serial ekg If gets long o[timze Mag and potassium Check for clonus and abnormal neuro features. [AH]  2030 Lithium(!): 0.59 [AH]  2032 Lithium(!): 0.59 [AH]    Clinical Course User Index [AH] Margarita Mail, PA-C   MDM Rules/Calculators/A&P                          30 year old female here with intentional drug overdose.  She is here voluntarily. I have medically cleared the patient both her lithium and Depakote levels.  She has peaked and down trended.  She is medically clear for psychiatric evaluation.  I ordered and reviewed labs that included CBC which shows mild anemia, CMP with mildly elevated glucose, negative salicylate ethanol and Tylenol levels.  Lithium level below therapeutic threshold, trending up then down.  Valproic acid slightly elevated and now within normal limits.  Respiratory panel is negative for COVID or influenza.  No evidence of QT prolongation on EKG. Final Clinical Impression(s) / ED Diagnoses Final diagnoses:  Intentional drug overdose, initial encounter [facility])    Rx / [place] Orders ED Discharge Orders    None       Margarita Mail, PA-C 09/11/20 2340    Milton Ferguson, MD 09/13/20 1549

## 2020-09-11 NOTE — Discharge Summary (Signed)
[name] to be D/C'd home per NP  order. Discussed with the patient and all questions fully answered. An After Visit Summary was printed and given to the patient. Medication sample was also given to patient.Patient escorted out and D/C home via safe transport.  [name]  09/11/2020 12:10 PM

## 2020-09-12 LAB — CBG MONITORING, ED: Glucose-Capillary: 120 mg/dL — ABNORMAL HIGH (ref 70–99)

## 2020-09-12 NOTE — ED Notes (Signed)
Patient changed out into burgundy scrubs. Patient has 1 patient belongings bag across from room 18 at the nurse station.

## 2020-09-12 NOTE — ED Provider Notes (Signed)
Patient has been evaluated by behavioral health and psychiatry team, determined to be appropriate for outpatient management.  Outpatient resources have been provided.  She has spoken to her ACT member Scientist, research (physical sciences).  She is otherwise hemodynamically stable and medically cleared for discharge at this time.   Wyvonnia Dusky, MD 09/12/20 1102

## 2020-09-12 NOTE — BH Assessment (Signed)
TTS requested nursing to place TTS machine in patient's room.
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
## 2020-09-12 NOTE — ED Notes (Signed)
Patient given food and diet gingerale 

## 2020-09-12 NOTE — BH Assessment (Addendum)
[place] Assessment Progress Note  Per Thomes Lolling, NP, this voluntary pt will not require psychiatric hospitalization once she is seen in person at [facility] by a representative of the [facility] ACT Team, her outpatient provider.  At 10:00 this Probation officer called [facility] and spoke ot [place].  She reports that Joellen Jersey is en route here right now.  Joellen Jersey will not be able to transport pt, but she will see her.  Pt will then be psychiatrically cleared.  Discharge instructions advise pt to continue treatment with the her ACT Team.  EDP Octaviano Glow, MD and pt's nurse, Joellen Jersey, have been notified.  Jalene Mullet, MA Triage Specialist [phone]   Addendum:  At 10:26 pt's nurse, Joellen Jersey, reports that pt's ACT Team representative has arrived to speak to pt.  Pt is psychiatrically cleared.  Jalene Mullet, [place] [facility] Coordinator [phone]   Addendum:  At 10:41 I spoke to pt's ACT Team representative, also named Joellen Jersey, informing her of details noted above.  Pt remains psychiatrically cleared.  Jalene Mullet, [place] Coordinator [phone]

## 2020-09-12 NOTE — ED Notes (Addendum)
Poison Control called and got an update on the patient along with her lithium levels. They closed the case on this patient.

## 2020-09-12 NOTE — Discharge Instructions (Addendum)
For your behavioral health needs, you are advised to continue treatment with the [facility] ACT Team: ° °     [facility] °     [address] °     [place], [place] [address] °     [phone] °

## 2020-09-12 NOTE — ED Notes (Signed)
I gave patient her breakfast tray

## 2020-09-13 ENCOUNTER — Emergency Department (HOSPITAL_COMMUNITY)
Admission: EM | Admit: 2020-09-13 | Discharge: 2020-09-14 | Payer: Medicaid Other | Attending: Emergency Medicine | Admitting: Emergency Medicine

## 2020-09-13 DIAGNOSIS — Z794 Long term (current) use of insulin: Secondary | ICD-10-CM | POA: Insufficient documentation

## 2020-09-13 DIAGNOSIS — F17290 Nicotine dependence, other tobacco product, uncomplicated: Secondary | ICD-10-CM | POA: Insufficient documentation

## 2020-09-13 DIAGNOSIS — I10 Essential (primary) hypertension: Secondary | ICD-10-CM | POA: Insufficient documentation

## 2020-09-13 DIAGNOSIS — Z046 Encounter for general psychiatric examination, requested by authority: Secondary | ICD-10-CM | POA: Diagnosis not present

## 2020-09-13 DIAGNOSIS — Z79899 Other long term (current) drug therapy: Secondary | ICD-10-CM | POA: Diagnosis not present

## 2020-09-13 DIAGNOSIS — F17210 Nicotine dependence, cigarettes, uncomplicated: Secondary | ICD-10-CM | POA: Diagnosis not present

## 2020-09-13 DIAGNOSIS — F250 Schizoaffective disorder, bipolar type: Secondary | ICD-10-CM | POA: Insufficient documentation

## 2020-09-13 DIAGNOSIS — E1140 Type 2 diabetes mellitus with diabetic neuropathy, unspecified: Secondary | ICD-10-CM | POA: Diagnosis not present

## 2020-09-13 DIAGNOSIS — Z20822 Contact with and (suspected) exposure to covid-19: Secondary | ICD-10-CM | POA: Diagnosis not present

## 2020-09-13 DIAGNOSIS — T50902A Poisoning by unspecified drugs, medicaments and biological substances, intentional self-harm, initial encounter: Secondary | ICD-10-CM | POA: Insufficient documentation

## 2020-09-13 DIAGNOSIS — R45851 Suicidal ideations: Secondary | ICD-10-CM

## 2020-09-13 LAB — COMPREHENSIVE METABOLIC PANEL
ALT: 11 U/L (ref 0–44)
AST: 15 U/L (ref 15–41)
Albumin: 3.8 g/dL (ref 3.5–5.0)
Alkaline Phosphatase: 48 U/L (ref 38–126)
Anion gap: 7 (ref 5–15)
BUN: 12 mg/dL (ref 6–20)
CO2: 28 mmol/L (ref 22–32)
Calcium: 9.2 mg/dL (ref 8.9–10.3)
Chloride: 105 mmol/L (ref 98–111)
Creatinine, Ser: 0.89 mg/dL (ref 0.44–1.00)
GFR, Estimated: >60 mL/min (ref >60)
Glucose, Bld: 135 mg/dL — ABNORMAL HIGH (ref 70–99)
Potassium: 3.8 mmol/L (ref 3.5–5.1)
Sodium: 140 mmol/L (ref 135–145)
Total Bilirubin: 0.2 mg/dL — ABNORMAL LOW (ref 0.3–1.2)
Total Protein: 7.4 g/dL (ref 6.5–8.1)

## 2020-09-13 LAB — RAPID URINE DRUG SCREEN, HOSP PERFORMED
Amphetamines: NOT DETECTED
Barbiturates: POSITIVE — AB
Benzodiazepines: NOT DETECTED
Cocaine: NOT DETECTED
Opiates: NOT DETECTED
Tetrahydrocannabinol: NOT DETECTED

## 2020-09-13 LAB — CBC WITH DIFFERENTIAL/PLATELET
Abs Immature Granulocytes: 0.04 10*3/uL (ref 0.00–0.07)
Basophils Absolute: 0 10*3/uL (ref 0.0–0.1)
Basophils Relative: 0 %
Eosinophils Absolute: 0.3 10*3/uL (ref 0.0–0.5)
Eosinophils Relative: 2 %
HCT: 36.1 % (ref 36.0–46.0)
Hemoglobin: 11.3 g/dL — ABNORMAL LOW (ref 12.0–15.0)
Immature Granulocytes: 0 %
Lymphocytes Relative: 18 %
Lymphs Abs: 2.2 10*3/uL (ref 0.7–4.0)
MCH: 24.2 pg — ABNORMAL LOW (ref 26.0–34.0)
MCHC: 31.3 g/dL (ref 30.0–36.0)
MCV: 77.5 fL — ABNORMAL LOW (ref 80.0–100.0)
Monocytes Absolute: 1.2 10*3/uL — ABNORMAL HIGH (ref 0.1–1.0)
Monocytes Relative: 10 %
Neutro Abs: 8.4 10*3/uL — ABNORMAL HIGH (ref 1.7–7.7)
Neutrophils Relative %: 70 %
Platelets: 184 10*3/uL (ref 150–400)
RBC: 4.66 MIL/uL (ref 3.87–5.11)
RDW: 16.3 % — ABNORMAL HIGH (ref 11.5–15.5)
WBC: 12.1 10*3/uL — ABNORMAL HIGH (ref 4.0–10.5)
nRBC: 0 % (ref 0.0–0.2)

## 2020-09-13 LAB — RESP PANEL BY RT-PCR (FLU A&B, COVID) ARPGX2
Influenza A by PCR: NEGATIVE
Influenza B by PCR: NEGATIVE
SARS Coronavirus 2 by RT PCR: NEGATIVE

## 2020-09-13 LAB — CBG MONITORING, ED: Glucose-Capillary: 124 mg/dL — ABNORMAL HIGH (ref 70–99)

## 2020-09-13 LAB — SALICYLATE LEVEL: Salicylate Lvl: <7 mg/dL — ABNORMAL LOW (ref 7.0–30.0)

## 2020-09-13 LAB — I-STAT BETA HCG BLOOD, ED (MC, WL, AP ONLY): I-stat hCG, quantitative: <5 m[IU]/mL (ref <5)

## 2020-09-13 LAB — ETHANOL: Alcohol, Ethyl (B): <10 mg/dL (ref <10)

## 2020-09-13 LAB — VALPROIC ACID LEVEL: Valproic Acid Lvl: 53 ug/mL (ref 50.0–100.0)

## 2020-09-13 LAB — ACETAMINOPHEN LEVEL: Acetaminophen (Tylenol), Serum: <10 ug/mL — ABNORMAL LOW (ref 10–30)

## 2020-09-13 MED ORDER — LITHIUM CARBONATE ER 450 MG PO TBCR: 450.0000 mg | EXTENDED_RELEASE_TABLET | Freq: Every day | ORAL | Status: DC

## 2020-09-13 MED ORDER — INSULIN DETEMIR 100 UNIT/ML ~~LOC~~ SOLN
40.0000 [IU] | Freq: Two times a day (BID) | SUBCUTANEOUS | Status: DC
  Administered 2020-09-13: 40 [IU] via SUBCUTANEOUS
  Filled 2020-09-13: qty 0.4

## 2020-09-13 MED ORDER — HYDROXYZINE HCL 25 MG PO TABS: 25.0000 mg | ORAL_TABLET | Freq: Three times a day (TID) | ORAL | Status: DC
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
## 2020-09-13 NOTE — ED Triage Notes (Signed)
Pt to [facility] by [facility]. Pt IVCd .  Pt states wants to OD on medication.

## 2020-09-13 NOTE — ED Provider Notes (Signed)
[facility] DEPT Provider Note   CSN: [id number] Arrival date & time: 09/13/20  1714     History Chief Complaint  Patient presents with  . Suicidal    [name] is a 30 y.o. female with past medical history significant for diabetes, bipolar who presents for evaluation under IVC.  Patient IVC by ACT team.  Was here 2 days ago for suicide attempt with attempted overdose on her home medications.  She is currently out of her home medications.  She admits to suicidal ideation with plans to overdose.  She denies any homicidal ideation.  History of bipolar and schizophrenia has not taken her medication over the last 2 days.  She states she feels like she is "sad."  States "people are out to get me."  Denies any complaints.  Denies any alcohol use, with substance use.  No headache, fever, emesis, chest pain, abdominal pain.  Denies chance of pregnancy.  Denies additional aggravating or relieving factors.  History obtained from patient and past medical records.  No interpreter used.  HPI     Past Medical History:  Diagnosis Date  . Anxiety   . Bipolar 1 disorder ([place])   . Cognitive deficits   . Depression   . Diabetes mellitus without complication ([place])   . Hypertension   . Mental disorder   . Mental health disorder   . Obesity     Patient Active Problem List   Diagnosis Date Noted  . Syphilis 07/15/2020  . Malingering 06/05/2020  . Gastroesophageal reflux disease 05/04/2020  . Hyperglycemia due to type 2 diabetes mellitus ([place]) 05/04/2020  . Long term (current) use of insulin ([place]) 05/04/2020  . Migraine without aura 05/04/2020  . Morbid obesity ([place]) 05/04/2020  . 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 2 diabetes mellitus ([place]) 05/04/2020  . Prolapsed internal hemorrhoids 05/04/2020  . Vitamin D deficiency 05/04/2020  . Other symptoms and signs involving cognitive functions and awareness 05/04/2020  . Suicide attempt ([place])   . Anxiety state  03/06/2020  . Schizophrenia ([place]) 09/13/2019  . Bipolar I disorder, most recent episode depressed ([place]) 06/23/2019  . MDD (major depressive disorder) 10/10/2018  . Schizoaffective disorder, bipolar type ([place]) 09/25/2018  . Affective psychosis, bipolar ([place]) 06/13/2018  . HTN (hypertension) 05/03/2018  . Tobacco use disorder 05/03/2018  . Adjustment disorder with emotional disturbance 01/02/2018  . 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 Moderate bipolar I disorder, most recent episode depressed ([place])   . Psychosis ([place])   . 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 OCD (obsessive compulsive disorder) 10/05/2016  . Major depressive disorder, recurrent episode, mild ([place]) 05/04/2016  . 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 Learning disability 07/18/2015  . 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 Diabetes mellitus ([place]) 07/18/2015  . 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 Hyperlipidemia 07/18/2015  . Severe episode of recurrent major depressive disorder, without psychotic features ([place])   . Suicidal ideation   . Drug overdose   . Cognitive deficits 10/12/2012  . Generalized anxiety disorder 06/28/2012    Past Surgical History:  Procedure Laterality Date  . CESAREAN SECTION    . CESAREAN SECTION N/A 04/25/2013   Procedure: REPEAT CESAREAN SECTION;  Surgeon: [name], MD;  Location: [facility];  Service: Obstetrics;  Laterality: N/A;  . MASS EXCISION N/A 06/03/2012   Procedure: EXCISION MASS;  Surgeon: [name], MD;  Location: [facility];  Service: ENT;  Laterality: N/A;  Excision uvula mass  . TONSILLECTOMY N/A 06/03/2012   Procedure: TONSILLECTOMY;  Surgeon: [name], MD;  Location: [facility];  Service: ENT;  Laterality: N/A;  . TONSILLECTOMY       OB History    Gravida  3   Para  3   Term  3    Preterm  0   AB  0   Living  3     SAB  0   IAB  0   Ectopic  0   Multiple      Live Births  3           Family History  Problem Relation Age of Onset  . Hypertension Mother   . Diabetes Father     Social History   Tobacco Use  . Smoking status: Current Every Day Smoker    Packs/day: 4.00    Years: 11.00    Pack years: 44.00    Types: Cigarettes, Cigars  . Smokeless tobacco: Never Used  . Tobacco comment: Pt declined  Vaping Use  . Vaping Use: Never used  Substance Use Topics  . Alcohol use: Not Currently    Comment: occ: last intake 4 mts ago  . Drug use: Not Currently    Types: "Crack" cocaine, Other-see comments    Comment: Patient reports hx of smoking Crack    Home Medications Prior to Admission medications   Medication Sig Start Date End Date Taking? Authorizing Provider  amLODipine (NORVASC) 5 MG tablet Take 5 mg by mouth daily.    [provider]  benztropine (COGENTIN) 1 MG tablet Take 1 mg by mouth 2 (two) times daily. 05/01/20   [provider]  cholecalciferol (VITAMIN D) 25 MCG (1000 UNIT) tablet Take 1,000 Units by mouth daily. 04/30/20   [provider]  divalproex (DEPAKOTE ER) 500 MG 24 hr tablet Take 500 mg by mouth in the morning, at noon, and at bedtime.    [provider]  divalproex (DEPAKOTE) 500 MG DR tablet Take 2 tablets (1,000 mg total) by mouth every 12 (twelve) hours. 09/11/20   Money, Lowry Ram, FNP  haloperidol (HALDOL) 10 MG tablet Take 10 mg by mouth at bedtime. 05/09/20   [provider]  haloperidol decanoate (HALDOL DECANOATE) 100 MG/ML injection Inject 150 mg into the muscle every 28 (twenty-eight) days. Inject 150mg  (1.60ml) every 28 days.    [provider]  hydrOXYzine (ATARAX/VISTARIL) 25 MG tablet Take 1 tablet (25 mg total) by mouth 3 (three) times daily as needed for anxiety. Patient not taking: No sig reported 05/27/20   Derrill Center, NP  hydrOXYzine  (ATARAX/VISTARIL) 25 MG tablet Take 25 mg by mouth 3 (three) times daily.    [provider]  insulin detemir (LEVEMIR) 100 UNIT/ML injection Inject 0.4 mLs (40 Units total) into the skin 2 (two) times daily. 05/05/20   Montine Circle, PA-C  lithium carbonate (ESKALITH) 450 MG CR tablet Take 450 mg by mouth daily.    [provider]  omeprazole (PRILOSEC) 20 MG capsule Take 20 mg by mouth daily.    [provider]  ondansetron (ZOFRAN) 4 MG tablet Take 1 tablet (4 mg total) by mouth every 6 (six) hours. 09/07/20   Peri Jefferson, PA-C  prazosin (MINIPRESS) 5 MG capsule Take 5 mg by mouth at bedtime.    [provider]  primidone (MYSOLINE) 50 MG tablet Take 50 mg by mouth 3 (  three) times daily.    [provider]  simvastatin (ZOCOR) 20 MG tablet Take 20 mg by mouth daily.    [provider]  vitamin B-12 (CYANOCOBALAMIN) 1000 MCG tablet Take 1,000 mcg by mouth daily. 11/13/19   [provider]    Allergies    Wellbutrin [bupropion], Omnipaque [iohexol], and Penicillin g  Review of Systems   Review of Systems  Constitutional: Negative.   HENT: Negative.   Respiratory: Negative.   Cardiovascular: Negative.   Gastrointestinal: Negative.   Genitourinary: Negative.   Musculoskeletal: Negative.   Skin: Negative.   Neurological: Negative.   Psychiatric/Behavioral: Positive for suicidal ideas.  All other systems reviewed and are negative.   Physical Exam Updated Vital Signs BP (!) 153/86 (BP Location: Right Arm)   Pulse (!) 115   Temp 98.7 F (37.1 C) (Oral)   Resp 18   LMP 08/16/2020 (Approximate)   Physical Exam Vitals and nursing note reviewed.  Constitutional:      General: She is not in acute distress.    Appearance: She is well-developed. She is not ill-appearing, toxic-appearing or diaphoretic.  HENT:     Head: Normocephalic and atraumatic.  Eyes:     Pupils: Pupils are equal, round, and reactive to light.   Cardiovascular:     Rate and Rhythm: Normal rate.  Pulmonary:     Effort: No respiratory distress.  Abdominal:     General: There is no distension.  Musculoskeletal:        General: Normal range of motion.     Cervical back: Normal range of motion.  Skin:    General: Skin is warm and dry.  Neurological:     Mental Status: She is alert.  Psychiatric:        Mood and Affect: Affect is flat.        Thought Content: Thought content includes suicidal ideation. Thought content does not include homicidal ideation. Thought content includes suicidal plan. Thought content does not include homicidal plan.     Comments: Admits to suicidal usual plans to overdose on pills.  Denies any AVH.  She has flat affect.  She appears cooperative     ED Results / Procedures / Treatments   Labs (all labs ordered are listed, but only abnormal results are displayed) Labs Reviewed  COMPREHENSIVE METABOLIC PANEL - Abnormal; Notable for the following components:      Result Value   Glucose, Bld 135 (*)    Total Bilirubin 0.2 (*)    All other components within normal limits  RAPID URINE DRUG SCREEN, HOSP PERFORMED - Abnormal; Notable for the following components:   Barbiturates POSITIVE (*)    All other components within normal limits  CBC WITH DIFFERENTIAL/PLATELET - Abnormal; Notable for the following components:   WBC 12.1 (*)    Hemoglobin 11.3 (*)    MCV 77.5 (*)    MCH 24.2 (*)    RDW 16.3 (*)    Neutro Abs 8.4 (*)    Monocytes Absolute 1.2 (*)    All other components within normal limits  SALICYLATE LEVEL - Abnormal; Notable for the following components:   Salicylate Lvl <3.0 (*)    All other components within normal limits  ACETAMINOPHEN LEVEL - Abnormal; Notable for the following components:   Acetaminophen (Tylenol), Serum <10 (*)    All other components within normal limits  CBG MONITORING, ED - Abnormal; Notable for the following components:   Glucose-Capillary 124 (*)    All other  components within normal limits  RESP PANEL BY RT-PCR (FLU A&B, COVID) ARPGX2  ETHANOL  LITHIUM LEVEL  VALPROIC ACID LEVEL  I-STAT BETA HCG BLOOD, ED ([facility], [facility], [facility] ONLY)    EKG None  Radiology No results found.  Procedures Procedures   Medications Ordered in ED Medications  hydrOXYzine (ATARAX/VISTARIL) tablet 25 mg (has no administration in time range)  insulin detemir (LEVEMIR) injection 40 Units (has no administration in time range)  lithium carbonate (ESKALITH) CR tablet 450 mg (has no administration in time range)    ED Course  I have reviewed the triage vital signs and the nursing notes.  Pertinent labs & imaging results that were available during my care of the patient were reviewed by me and considered in my medical decision making (see chart for details).  Patient here under IVC for suicidal ideation.  Patient's act team placed IVC orders.  Patient seen a few days ago with suicide attempt with overdose on her medications.  States she will overdose again if she could have access to medications.  States she feels "sad."  She denies any AVH.  She is cooperative.  Plan on screening labs.  Labs personally reviewed and interpreted:  No significant abnormality UDS positive for benzos   Patient medically cleared  Currently under IVC  Orders placed  Dispo per psychiatry    MDM Rules/Calculators/A&P                           Final Clinical Impression(s) / ED Diagnoses Final diagnoses:  Suicidal ideation    Rx / DC Orders ED Discharge Orders    None       Lowana Hable A, PA-C 09/13/20 2224    Lajean Saver, MD 09/13/20 2352

## 2020-09-14 ENCOUNTER — Other Ambulatory Visit: Payer: Self-pay

## 2020-09-14 ENCOUNTER — Ambulatory Visit (HOSPITAL_COMMUNITY)
Admission: EM | Admit: 2020-09-14 | Discharge: 2020-09-14 | Disposition: A | Discharge status: Home / Self Care | Payer: Medicaid Other | Source: Home / Self Care

## 2020-09-14 DIAGNOSIS — F250 Schizoaffective disorder, bipolar type: Secondary | ICD-10-CM | POA: Insufficient documentation

## 2020-09-14 DIAGNOSIS — F17210 Nicotine dependence, cigarettes, uncomplicated: Secondary | ICD-10-CM | POA: Insufficient documentation

## 2020-09-14 LAB — LITHIUM LEVEL: Lithium Lvl: 0.31 mmol/L — ABNORMAL LOW (ref 0.60–1.20)

## 2020-09-14 LAB — GLUCOSE, CAPILLARY: Glucose-Capillary: 174 mg/dL — ABNORMAL HIGH (ref 70–99)

## 2020-09-14 MED ORDER — ACETAMINOPHEN 325 MG PO TABS: 650.0000 mg | ORAL_TABLET | Freq: Four times a day (QID) | ORAL | Status: DC | PRN

## 2020-09-14 MED ORDER — INSULIN DETEMIR 100 UNIT/ML ~~LOC~~ SOLN: 40.0000 [IU] | Freq: Two times a day (BID) | SUBCUTANEOUS | Status: DC

## 2020-09-14 MED ORDER — SIMVASTATIN 20 MG PO TABS
20.0000 mg | ORAL_TABLET | Freq: Every day | ORAL | Status: DC
  Filled 2020-09-14: qty 1

## 2020-09-14 MED ORDER — HYDROXYZINE HCL 25 MG PO TABS: 25.0000 mg | ORAL_TABLET | Freq: Three times a day (TID) | ORAL | Status: DC | PRN

## 2020-09-14 MED ORDER — ALUM & MAG HYDROXIDE-SIMETH 200-200-20 MG/5ML PO SUSP: 30.0000 mL | ORAL | Status: DC | PRN

## 2020-09-14 MED ORDER — BENZTROPINE MESYLATE 1 MG PO TABS
1.0000 mg | ORAL_TABLET | Freq: Two times a day (BID) | ORAL | Status: DC
  Administered 2020-09-14: 1 mg via ORAL
  Filled 2020-09-14: qty 1

## 2020-09-14 MED ORDER — AMLODIPINE BESYLATE 5 MG PO TABS
5.0000 mg | ORAL_TABLET | Freq: Every day | ORAL | Status: DC
  Filled 2020-09-14: qty 1

## 2020-09-14 MED ORDER — VITAMIN D3 25 MCG (1000 UNIT) PO TABS
1000.0000 [IU] | ORAL_TABLET | Freq: Every day | ORAL | Status: DC
  Filled 2020-09-14 (×2): qty 1

## 2020-09-14 MED ORDER — LITHIUM CARBONATE ER 450 MG PO TBCR
450.0000 mg | EXTENDED_RELEASE_TABLET | Freq: Every day | ORAL | Status: DC
  Filled 2020-09-14: qty 1

## 2020-09-14 MED ORDER — PRAZOSIN HCL 2 MG PO CAPS
5.0000 mg | ORAL_CAPSULE | Freq: Every day | ORAL | Status: DC
  Administered 2020-09-14: 5 mg via ORAL
  Filled 2020-09-14: qty 1

## 2020-09-14 MED ORDER — HALOPERIDOL 5 MG PO TABS
10.0000 mg | ORAL_TABLET | Freq: Every day | ORAL | Status: DC
  Administered 2020-09-14: 10 mg via ORAL
  Filled 2020-09-14: qty 2

## 2020-09-14 MED ORDER — TRAZODONE HCL 50 MG PO TABS: 50.0000 mg | ORAL_TABLET | Freq: Every evening | ORAL | Status: DC | PRN

## 2020-09-14 MED ORDER — PANTOPRAZOLE SODIUM 40 MG PO TBEC
40.0000 mg | DELAYED_RELEASE_TABLET | Freq: Every day | ORAL | Status: DC
  Filled 2020-09-14: qty 1

## 2020-09-14 MED ORDER — MAGNESIUM HYDROXIDE 400 MG/5ML PO SUSP: 30.0000 mL | Freq: Every day | ORAL | Status: DC | PRN

## 2020-09-14 NOTE — ED Provider Notes (Addendum)
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 09/10/2020          Final   Comment:   GEST. AGE      CONC.  (mIU/mL)   <=1 WEEK        5 - 50     2 WEEKS       50 - 500     3  WEEKS       100 - 10,000     4 WEEKS     1,000 - 30,000        FEMALE AND NON-PREGNANT FEMALE:     LESS THAN 5 mIU/mL   . WBC 09/10/2020 7.5  4.0 - 10.5 K/uL Final  . RBC 09/10/2020 4.77  3.87 - 5.11 MIL/uL Final  . Hemoglobin 09/10/2020 11.5* 12.0 - 15.0 g/dL Final  . HCT 09/10/2020 36.5  36.0 - 46.0 % Final  . MCV 09/10/2020 76.5* 80.0 - 100.0 fL Final  . MCH 09/10/2020 24.1* 26.0 - 34.0 pg Final  . MCHC 09/10/2020 31.5  30.0 - 36.0 g/dL Final  . RDW 09/10/2020 15.8* 11.5 - 15.5 % Final  . Platelets 09/10/2020 204  150 - 400 K/uL Final  . nRBC 09/10/2020 0.0  0.0 - 0.2 % Final  . Neutrophils Relative % 09/10/2020 49  % Final  . Neutro Abs 09/10/2020 3.8  1.7 - 7.7 K/uL Final  . Lymphocytes Relative 09/10/2020 39  % Final  . Lymphs Abs 09/10/2020 2.9  0.7 - 4.0 K/uL Final  . Monocytes Relative 09/10/2020 6  % Final  . Monocytes Absolute 09/10/2020 0.4  0.1 - 1.0 K/uL Final  . Eosinophils Relative 09/10/2020 5  % Final  . Eosinophils Absolute 09/10/2020 0.4  0.0 - 0.5 K/uL Final  . Basophils Relative 09/10/2020 1  % Final  . Basophils Absolute 09/10/2020 0.0  0.0 - 0.1 K/uL Final  . Immature Granulocytes 09/10/2020 0  % Final  . Abs Immature Granulocytes 09/10/2020 0.02  0.00 - 0.07 K/uL Final   Performed at [facility], [place] [address] [place], [place] [address]  . Sodium 09/10/2020 140  135 - 145 mmol/L Final  . Potassium 09/10/2020 3.7  3.5 - 5.1 mmol/L Final  . Chloride 09/10/2020 110  98 - 111 mmol/L Final  . CO2 09/10/2020 25  22 - 32 mmol/L Final  . Glucose, Bld 09/10/2020 123* 70 - 99 mg/dL Final   Glucose reference range applies only to samples  taken after fasting for at least 8 hours.  . BUN 09/10/2020 10  6 - 20 mg/dL Final  . Creatinine, Ser 09/10/2020 0.74  0.44 - 1.00 mg/dL Final  . Calcium 09/10/2020 8.9  8.9 - 10.3 mg/dL Final  . Total Protein 09/10/2020 7.5  6.5 - 8.1 g/dL Final  . Albumin 09/10/2020 4.0  3.5 - 5.0 g/dL Final  . AST 09/10/2020 17  15 - 41 U/L Final  . ALT 09/10/2020 13  0 - 44 U/L Final  . Alkaline Phosphatase 09/10/2020 49  38 - 126 U/L Final  . Total Bilirubin 09/10/2020 0.7  0.3 - 1.2 mg/dL Final  . GFR, Estimated 09/10/2020 >60  >60 mL/min Final   Comment: (NOTE) Calculated using the CKD-EPI Creatinine Equation (2021)   . Anion gap 09/10/2020 5  5 - 15 Final   Performed at [facility], [place] [address] [place], [place] [address]  . Opiates 09/10/2020 NONE DETECTED  NONE DETECTED Final  . Cocaine 09/10/2020 NONE DETECTED  NONE DETECTED Final  . Benzodiazepines 09/10/2020 NONE DETECTED  NONE DETECTED Final  . Amphetamines 09/10/2020 NONE DETECTED  NONE DETECTED Final  . Tetrahydrocannabinol 09/10/2020 NONE DETECTED  NONE DETECTED Final  . Barbiturates 09/10/2020 POSITIVE* NONE DETECTED Final   Comment: (NOTE) DRUG SCREEN FOR MEDICAL PURPOSES ONLY.  IF CONFIRMATION IS NEEDED FOR ANY PURPOSE, NOTIFY LAB WITHIN 5 DAYS.  LOWEST DETECTABLE  LIMITS FOR URINE DRUG SCREEN Drug Class                     Cutoff (ng/mL) Amphetamine and metabolites    1000 Barbiturate and metabolites    200 Benzodiazepine                 [id number] Tricyclics and metabolites     300 Opiates and metabolites        300 Cocaine and metabolites        300 THC                            50 Performed at [facility], [place] [address], [place], [place] [address]   . SARS Coronavirus 2 by RT PCR 09/10/2020 NEGATIVE  NEGATIVE Final   Comment: (NOTE) SARS-CoV-2 target nucleic acids are NOT DETECTED.  The SARS-CoV-2 RNA is generally detectable in upper respiratory specimens during the acute phase  of infection. The lowest concentration of SARS-CoV-2 viral copies this assay can detect is 138 copies/mL. A negative result does not preclude SARS-Cov-2 infection and should not be used as the sole basis for treatment or other patient management decisions. A negative result may occur with  improper specimen collection/handling, submission of specimen other than nasopharyngeal swab, presence of viral mutation(s) within the areas targeted by this assay, and inadequate number of viral copies(<138 copies/mL). A negative result must be combined with clinical observations, patient history, and epidemiological information. The expected result is Negative.  Fact Sheet for Patients:  EntrepreneurPulse.com.au  Fact Sheet for Healthcare Providers:  IncredibleEmployment.be  This test is no                          t yet approved or cleared by the [place] FDA and  has been authorized for detection and/or diagnosis of SARS-CoV-2 by FDA under an Emergency Use Authorization (EUA). This EUA will remain  in effect (meaning this test can be used) for the duration of the COVID-19 declaration under Section 564(b)(1) of the Act, 21 U.S.C.section 360bbb-3(b)(1), unless the authorization is terminated  or revoked sooner.      . Influenza A by PCR 09/10/2020 NEGATIVE  NEGATIVE Final  . Influenza B by PCR 09/10/2020 NEGATIVE  NEGATIVE Final   Comment: (NOTE) The Xpert Xpress SARS-CoV-2/FLU/RSV plus assay is intended as an aid in the diagnosis of influenza from Nasopharyngeal swab specimens and should not be used as a sole basis for treatment. Nasal washings and aspirates are unacceptable for Xpert Xpress SARS-CoV-2/FLU/RSV testing.  Fact Sheet for Patients: EntrepreneurPulse.com.au  Fact Sheet for Healthcare Providers: IncredibleEmployment.be  This test is not yet approved or cleared by the [place] FDA and has been  authorized for detection and/or diagnosis of SARS-CoV-2 by FDA under an Emergency Use Authorization (EUA). This EUA will remain in effect (meaning this test can be used) for the duration of the COVID-19 declaration under Section 564(b)(1) of the Act, 21 U.S.C. section 360bbb-3(b)(1), unless the authorization is terminated or revoked.  Performed at [facility], [place] [address], [place], [place] [address]   Admission on 09/07/2020, Discharged on 09/07/2020  Component Date Value Ref Range Status  . Preg Test, Ur 09/07/2020 NEGATIVE  NEGATIVE Final   Comment:        THE SENSITIVITY OF THIS METHODOLOGY IS >24 mIU/mL   . Glucose-Capillary 09/07/2020 84  70 -  99 mg/dL Final   Glucose reference range applies only to samples taken after fasting for at least 8 hours.  Admission on 08/26/2020, Discharged on 08/27/2020  Component Date Value Ref Range Status  . SARS Coronavirus 2 by RT PCR 08/26/2020 NEGATIVE  NEGATIVE Final   Comment: (NOTE) SARS-CoV-2 target nucleic acids are NOT DETECTED.  The SARS-CoV-2 RNA is generally detectable in upper respiratory specimens during the acute phase of infection. The lowest concentration of SARS-CoV-2 viral copies this assay can detect is 138 copies/mL. A negative result does not preclude SARS-Cov-2 infection and should not be used as the sole basis for treatment or other patient management decisions. A negative result may occur with  improper specimen collection/handling, submission of specimen other than nasopharyngeal swab, presence of viral mutation(s) within the areas targeted by this assay, and inadequate number of viral copies(<138 copies/mL). A negative result must be combined with clinical observations, patient history, and epidemiological information. The expected result is Negative.  Fact Sheet for Patients:  EntrepreneurPulse.com.au  Fact Sheet for Healthcare Providers:   IncredibleEmployment.be  This test is no                          t yet approved or cleared by the [place] FDA and  has been authorized for detection and/or diagnosis of SARS-CoV-2 by FDA under an Emergency Use Authorization (EUA). This EUA will remain  in effect (meaning this test can be used) for the duration of the COVID-19 declaration under Section 564(b)(1) of the Act, 21 U.S.C.section 360bbb-3(b)(1), unless the authorization is terminated  or revoked sooner.      . Influenza A by PCR 08/26/2020 NEGATIVE  NEGATIVE Final  . Influenza B by PCR 08/26/2020 NEGATIVE  NEGATIVE Final   Comment: (NOTE) The Xpert Xpress SARS-CoV-2/FLU/RSV plus assay is intended as an aid in the diagnosis of influenza from Nasopharyngeal swab specimens and should not be used as a sole basis for treatment. Nasal washings and aspirates are unacceptable for Xpert Xpress SARS-CoV-2/FLU/RSV testing.  Fact Sheet for Patients: EntrepreneurPulse.com.au  Fact Sheet for Healthcare Providers: IncredibleEmployment.be  This test is not yet approved or cleared by the [place] FDA and has been authorized for detection and/or diagnosis of SARS-CoV-2 by FDA under an Emergency Use Authorization (EUA). This EUA will remain in effect (meaning this test can be used) for the duration of the COVID-19 declaration under Section 564(b)(1) of the Act, 21 U.S.C. section 360bbb-3(b)(1), unless the authorization is terminated or revoked.  Performed at [facility], [place] [address], [place], [place] [address]   . SARS Coronavirus 2 Ag 08/26/2020 Negative  Negative Final  . WBC 08/26/2020 12.8* 4.0 - 10.5 K/uL Final  . RBC 08/26/2020 4.64  3.87 - 5.11 MIL/uL Final  . Hemoglobin 08/26/2020 11.1* 12.0 - 15.0 g/dL Final  . HCT 08/26/2020 35.4* 36.0 - 46.0 % Final  . MCV 08/26/2020 76.3* 80.0 - 100.0 fL Final  . MCH 08/26/2020 23.9* 26.0 - 34.0 pg Final  .  MCHC 08/26/2020 31.4  30.0 - 36.0 g/dL Final  . RDW 08/26/2020 15.0  11.5 - 15.5 % Final  . Platelets 08/26/2020 256  150 - 400 K/uL Final   REPEATED TO VERIFY  . nRBC 08/26/2020 0.0  0.0 - 0.2 % Final  . Neutrophils Relative % 08/26/2020 67  % Final  . Neutro Abs 08/26/2020 8.5* 1.7 - 7.7 K/uL Final  . Lymphocytes Relative 08/26/2020 25  % Final  .  Lymphs Abs 08/26/2020 3.2  0.7 - 4.0 K/uL Final  . Monocytes Relative 08/26/2020 5  % Final  . Monocytes Absolute 08/26/2020 0.7  0.1 - 1.0 K/uL Final  . Eosinophils Relative 08/26/2020 2  % Final  . Eosinophils Absolute 08/26/2020 0.3  0.0 - 0.5 K/uL Final  . Basophils Relative 08/26/2020 1  % Final  . Basophils Absolute 08/26/2020 0.1  0.0 - 0.1 K/uL Final  . Immature Granulocytes 08/26/2020 0  % Final  . Abs Immature Granulocytes 08/26/2020 0.05  0.00 - 0.07 K/uL Final   Performed at [facility], [place] [address] [place], [place] [address]  . Sodium 08/26/2020 137  135 - 145 mmol/L Final  . Potassium 08/26/2020 4.4  3.5 - 5.1 mmol/L Final  . Chloride 08/26/2020 105  98 - 111 mmol/L Final  . CO2 08/26/2020 26  22 - 32 mmol/L Final  . Glucose, Bld 08/26/2020 118* 70 - 99 mg/dL Final   Glucose reference range applies only to samples taken after fasting for at least 8 hours.  . BUN 08/26/2020 13  6 - 20 mg/dL Final  . Creatinine, Ser 08/26/2020 0.71  0.44 - 1.00 mg/dL Final  . Calcium 08/26/2020 9.5  8.9 - 10.3 mg/dL Final  . Total Protein 08/26/2020 7.2  6.5 - 8.1 g/dL Final  . Albumin 08/26/2020 4.2  3.5 - 5.0 g/dL Final  . AST 08/26/2020 18  15 - 41 U/L Final  . ALT 08/26/2020 19  0 - 44 U/L Final  . Alkaline Phosphatase 08/26/2020 56  38 - 126 U/L Final  . Total Bilirubin 08/26/2020 0.4  0.3 - 1.2 mg/dL Final  . GFR, Estimated 08/26/2020 >60  >60 mL/min Final   Comment: (NOTE) Calculated using the CKD-EPI Creatinine Equation (2021)   . Anion gap 08/26/2020 6  5 - 15 Final   Performed at [facility], [place] [address] [place], [place] [address]  . Hgb A1c MFr Bld 08/26/2020 7.3* 4.8 - 5.6 % Final   Comment: (NOTE) Pre diabetes:          5.7%-6.4%  Diabetes:              >6.4%  Glycemic control for   <7.0% adults with diabetes   . Mean Plasma Glucose 08/26/2020 162.81  mg/dL Final   Performed at [place] [address] [place], [place] [address]  . Alcohol, Ethyl (B) 08/26/2020 <10  <10 mg/dL Final   Comment: (NOTE) Lowest detectable limit for serum alcohol is 10 mg/dL.  For medical purposes only. Performed at [facility], [place] [address], [place],  [address]   . Cholesterol 08/26/2020 172  0 - 200 mg/dL Final  . Triglycerides 08/26/2020 166* <150 mg/dL Final  . HDL 08/26/2020 50  >40 mg/dL Final  . Total CHOL/HDL Ratio 08/26/2020 3.4  RATIO Final  . VLDL 08/26/2020 33  0 - 40 mg/dL Final  . LDL Cholesterol 08/26/2020 89  0 - 99 mg/dL Final   Comment:        Total Cholesterol/HDL:CHD Risk Coronary Heart Disease Risk Table                     Men   Women  1/2 Average Risk   3.4   3.3  Average Risk       5.0   4.4  2 X Average Risk   9.6   7.1  3 X Average Risk  23.4   11.0        Use the calculated Patient Ratio above and the CHD Risk Table to determine the patient's CHD Risk.        ATP III CLASSIFICATION (LDL):  <100     mg/dL   Optimal  100-129  mg/dL   Near or Above                    Optimal  130-159  mg/dL   Borderline  160-189  mg/dL   High  >190     mg/dL   Very High Performed at [place] [address], [place], [place] [address]   . TSH 08/26/2020 0.758  0.350 - 4.500 uIU/mL Final   Comment: Performed by a 3rd Generation assay with a functional sensitivity of <=0.01 uIU/mL. Performed at [facility], [place] [address], [place],  [address]   . POC Amphetamine UR 08/27/2020 None Detected  NONE DETECTED (Cut Off Level 1000 ng/mL) Final  . POC Secobarbital (BAR) 08/27/2020 None Detected  NONE DETECTED (Cut Off Level 300 ng/mL)  Final  . POC Buprenorphine (BUP) 08/27/2020 Positive* NONE DETECTED (Cut Off Level 10 ng/mL) Final  . POC Oxazepam (BZO) 08/27/2020 None Detected  NONE DETECTED (Cut Off Level 300 ng/mL) Final  . POC Cocaine UR 08/27/2020 None Detected  NONE DETECTED (Cut Off Level 300 ng/mL) Final  . POC Methamphetamine UR 08/27/2020 None Detected  NONE DETECTED (Cut Off Level 1000 ng/mL) Final  . POC Morphine 08/27/2020 None Detected  NONE DETECTED (Cut Off Level 300 ng/mL) Final  . POC Oxycodone UR 08/27/2020 None Detected  NONE DETECTED (Cut Off Level 100 ng/mL) Final  . POC Methadone UR 08/27/2020 None Detected  NONE DETECTED (Cut Off Level 300 ng/mL) Final  . POC Marijuana UR 08/27/2020 Positive* NONE DETECTED (Cut Off Level 50 ng/mL) Final  . Glucose-Capillary 08/27/2020 214* 70 - 99 mg/dL Final   Glucose reference range applies only to samples taken after fasting for at least 8 hours.  . Glucose-Capillary 08/27/2020 120* 70 - 99 mg/dL Final   Glucose reference range applies only to samples taken after fasting for at least 8 hours.  . Preg Test, Ur 08/27/2020 NEGATIVE  NEGATIVE Final   Comment:        THE SENSITIVITY OF THIS METHODOLOGY IS >24 mIU/mL   Admission on 08/12/2020, Discharged on 08/13/2020  Component Date Value Ref Range Status  . Glucose-Capillary 08/12/2020 175* 70 - 99 mg/dL Final   Glucose reference range applies only to samples taken after fasting for at least 8 hours.  . Glucose-Capillary 08/13/2020 117* 70 - 99 mg/dL Final   Glucose reference range applies only to samples taken after fasting for at least 8 hours.  Admission on 08/12/2020, Discharged on 08/12/2020  Component Date Value Ref Range Status  . Opiates 08/12/2020 NONE DETECTED  NONE DETECTED Final  . Cocaine 08/12/2020 NONE DETECTED  NONE DETECTED Final  . Benzodiazepines 08/12/2020 NONE DETECTED  NONE DETECTED Final  . Amphetamines 08/12/2020 NONE DETECTED  NONE DETECTED Final  . Tetrahydrocannabinol 08/12/2020 NONE  DETECTED  NONE DETECTED Final  . Barbiturates 08/12/2020 POSITIVE* NONE DETECTED Final   Comment: (NOTE) DRUG SCREEN FOR MEDICAL PURPOSES ONLY.  IF CONFIRMATION IS NEEDED FOR ANY PURPOSE, NOTIFY LAB WITHIN 5 DAYS.  LOWEST DETECTABLE LIMITS FOR URINE DRUG SCREEN Drug Class  Cutoff (ng/mL) Amphetamine and metabolites    1000 Barbiturate and metabolites    200 Benzodiazepine                 109 Tricyclics and metabolites     300 Opiates and metabolites        300 Cocaine and metabolites        300 THC                            50 Performed at [facility], [place] [address] [place], [place] [address]   . Alcohol, Ethyl (B) 08/12/2020 <10  <10 mg/dL Final   Comment: (NOTE) Lowest detectable limit for serum alcohol is 10 mg/dL.  For medical purposes only. Performed at [facility], [place] [address] [place], [place] [address]   . WBC 08/12/2020 10.1  4.0 - 10.5 K/uL Final  . RBC 08/12/2020 4.92  3.87 - 5.11 MIL/uL Final  . Hemoglobin 08/12/2020 11.8* 12.0 - 15.0 g/dL Final  . HCT 08/12/2020 38.4  36.0 - 46.0 % Final  . MCV 08/12/2020 78.0* 80.0 - 100.0 fL Final  . MCH 08/12/2020 24.0* 26.0 - 34.0 pg Final  . MCHC 08/12/2020 30.7  30.0 - 36.0 g/dL Final  . RDW 08/12/2020 15.7* 11.5 - 15.5 % Final  . Platelets 08/12/2020 225  150 - 400 K/uL Final  . nRBC 08/12/2020 0.0  0.0 - 0.2 % Final  . Neutrophils Relative % 08/12/2020 56  % Final  . Neutro Abs 08/12/2020 5.6  1.7 - 7.7 K/uL Final  . Lymphocytes Relative 08/12/2020 35  % Final  . Lymphs Abs 08/12/2020 3.6  0.7 - 4.0 K/uL Final  . Monocytes Relative 08/12/2020 4  % Final  . Monocytes Absolute 08/12/2020 0.4  0.1 - 1.0 K/uL Final  . Eosinophils Relative 08/12/2020 4  % Final  . Eosinophils Absolute 08/12/2020 0.4  0.0 - 0.5 K/uL Final  . Basophils Relative 08/12/2020 1  % Final  . Basophils Absolute 08/12/2020 0.1  0.0 - 0.1 K/uL Final  . Immature Granulocytes  08/12/2020 0  % Final  . Abs Immature Granulocytes 08/12/2020 0.03  0.00 - 0.07 K/uL Final   Performed at [facility], [place] [address], [place], [place] [address]  . Sodium 08/12/2020 141  135 - 145 mmol/L Final  . Potassium 08/12/2020 3.8  3.5 - 5.1 mmol/L Final  . Chloride 08/12/2020 108  98 - 111 mmol/L Final  . CO2 08/12/2020 25  22 - 32 mmol/L Final  . Glucose, Bld 08/12/2020 156* 70 - 99 mg/dL Final   Glucose reference range applies only to samples taken after fasting for at least 8 hours.  . BUN 08/12/2020 11  6 - 20 mg/dL Final  . Creatinine, Ser 08/12/2020 0.79  0.44 - 1.00 mg/dL Final  . Calcium 08/12/2020 9.4  8.9 - 10.3 mg/dL Final  . Total Protein 08/12/2020 7.9  6.5 - 8.1 g/dL Final  . Albumin 08/12/2020 4.2  3.5 - 5.0 g/dL Final  . AST 08/12/2020 25  15 - 41 U/L Final  . ALT 08/12/2020 19  0 - 44 U/L Final  . Alkaline Phosphatase 08/12/2020 52  38 - 126 U/L Final  . Total Bilirubin 08/12/2020 0.6  0.3 - 1.2 mg/dL Final  . GFR, Estimated 08/12/2020 >60  >60 mL/min Final   Comment: (NOTE) Calculated using the CKD-EPI Creatinine Equation (2021)   . Anion  gap 08/12/2020 8  5 - 15 Final   Performed at [facility], [address] [place], [place] [address]  . I-stat hCG, quantitative 08/12/2020 <5.0  <5 mIU/mL Final  . Comment 3 08/12/2020          Final   Comment:   GEST. AGE      CONC.  (mIU/mL)   <=1 WEEK        5 - 50     2 WEEKS       50 - 500     3 WEEKS       100 - 10,000     4 WEEKS     1,000 - 30,000        FEMALE AND NON-PREGNANT FEMALE:     LESS THAN 5 mIU/mL   . SARS Coronavirus 2 by RT PCR 08/12/2020 NEGATIVE  NEGATIVE Final   Comment: (NOTE) SARS-CoV-2 target nucleic acids are NOT DETECTED.  The SARS-CoV-2 RNA is generally detectable in upper respiratory specimens during the acute phase of infection. The lowest concentration of SARS-CoV-2 viral copies this assay can detect is 138 copies/mL. A negative result does  not preclude SARS-Cov-2 infection and should not be used as the sole basis for treatment or other patient management decisions. A negative result may occur with  improper specimen collection/handling, submission of specimen other than nasopharyngeal swab, presence of viral mutation(s) within the areas targeted by this assay, and inadequate number of viral copies(<138 copies/mL). A negative result must be combined with clinical observations, patient history, and epidemiological information. The expected result is Negative.  Fact Sheet for Patients:  BloggerCourse.com  Fact Sheet for Healthcare Providers:  SeriousBroker.it  This test is no                          t yet approved or cleared by the [place] FDA and  has been authorized for detection and/or diagnosis of SARS-CoV-2 by FDA under an Emergency Use Authorization (EUA). This EUA will remain  in effect (meaning this test can be used) for the duration of the COVID-19 declaration under Section 564(b)(1) of the Act, 21 U.S.C.section 360bbb-3(b)(1), unless the authorization is terminated  or revoked sooner.      . Influenza A by PCR 08/12/2020 NEGATIVE  NEGATIVE Final  . Influenza B by PCR 08/12/2020 NEGATIVE  NEGATIVE Final   Comment: (NOTE) The Xpert Xpress SARS-CoV-2/FLU/RSV plus assay is intended as an aid in the diagnosis of influenza from Nasopharyngeal swab specimens and should not be used as a sole basis for treatment. Nasal washings and aspirates are unacceptable for Xpert Xpress SARS-CoV-2/FLU/RSV testing.  Fact Sheet for Patients: BloggerCourse.com  Fact Sheet for Healthcare Providers: SeriousBroker.it  This test is not yet approved or cleared by the [place] FDA and has been authorized for detection and/or diagnosis of SARS-CoV-2 by FDA under an Emergency Use Authorization (EUA). This EUA will remain in  effect (meaning this test can be used) for the duration of the COVID-19 declaration under Section 564(b)(1) of the Act, 21 U.S.C. section 360bbb-3(b)(1), unless the authorization is terminated or revoked.  Performed at [facility], [address], [place], [place] [address]   . Glucose-Capillary 08/12/2020 157* 70 - 99 mg/dL Final   Glucose reference range applies only to samples taken after fasting for at least 8 hours.  Admission on 07/29/2020, Discharged on 07/30/2020  Component Date Value Ref Range Status  . SARS  Coronavirus 2 by RT PCR 07/29/2020 NEGATIVE  NEGATIVE Final   Comment: (NOTE) SARS-CoV-2 target nucleic acids are NOT DETECTED.  The SARS-CoV-2 RNA is generally detectable in upper respiratory specimens during the acute phase of infection. The lowest concentration of SARS-CoV-2 viral copies this assay can detect is 138 copies/mL. A negative result does not preclude SARS-Cov-2 infection and should not be used as the sole basis for treatment or other patient management decisions. A negative result may occur with  improper specimen collection/handling, submission of specimen other than nasopharyngeal swab, presence of viral mutation(s) within the areas targeted by this assay, and inadequate number of viral copies(<138 copies/mL). A negative result must be combined with clinical observations, patient history, and epidemiological information. The expected result is Negative.  Fact Sheet for Patients:  EntrepreneurPulse.com.au  Fact Sheet for Healthcare Providers:  IncredibleEmployment.be  This test is no                          t yet approved or cleared by the [place] FDA and  has been authorized for detection and/or diagnosis of SARS-CoV-2 by FDA under an Emergency Use Authorization (EUA). This EUA will remain  in effect (meaning this test can be used) for the duration of the COVID-19 declaration under  Section 564(b)(1) of the Act, 21 U.S.C.section 360bbb-3(b)(1), unless the authorization is terminated  or revoked sooner.      . Influenza A by PCR 07/29/2020 NEGATIVE  NEGATIVE Final  . Influenza B by PCR 07/29/2020 NEGATIVE  NEGATIVE Final   Comment: (NOTE) The Xpert Xpress SARS-CoV-2/FLU/RSV plus assay is intended as an aid in the diagnosis of influenza from Nasopharyngeal swab specimens and should not be used as a sole basis for treatment. Nasal washings and aspirates are unacceptable for Xpert Xpress SARS-CoV-2/FLU/RSV testing.  Fact Sheet for Patients: EntrepreneurPulse.com.au  Fact Sheet for Healthcare Providers: IncredibleEmployment.be  This test is not yet approved or cleared by the [place] FDA and has been authorized for detection and/or diagnosis of SARS-CoV-2 by FDA under an Emergency Use Authorization (EUA). This EUA will remain in effect (meaning this test can be used) for the duration of the COVID-19 declaration under Section 564(b)(1) of the Act, 21 U.S.C. section 360bbb-3(b)(1), unless the authorization is terminated or revoked.  Performed at [facility], [place] [address] [place], [place] [address]   . Sodium 07/29/2020 140  135 - 145 mmol/L Final  . Potassium 07/29/2020 3.9  3.5 - 5.1 mmol/L Final  . Chloride 07/29/2020 108  98 - 111 mmol/L Final  . CO2 07/29/2020 26  22 - 32 mmol/L Final  . Glucose, Bld 07/29/2020 167* 70 - 99 mg/dL Final   Glucose reference range applies only to samples taken after fasting for at least 8 hours.  . BUN 07/29/2020 10  6 - 20 mg/dL Final  . Creatinine, Ser 07/29/2020 0.79  0.44 - 1.00 mg/dL Final  . Calcium 07/29/2020 9.2  8.9 - 10.3 mg/dL Final  . Total Protein 07/29/2020 7.2  6.5 - 8.1 g/dL Final  . Albumin 07/29/2020 4.0  3.5 - 5.0 g/dL Final  . AST 07/29/2020 23  15 - 41 U/L Final  . ALT 07/29/2020 19  0 - 44 U/L Final  . Alkaline Phosphatase 07/29/2020 51   38 - 126 U/L Final  . Total Bilirubin 07/29/2020 0.5  0.3 - 1.2 mg/dL Final  . GFR, Estimated 07/29/2020 >60  >60 mL/min Final   Comment: (  NOTE) Calculated using the CKD-EPI Creatinine Equation (2021)   . Anion gap 07/29/2020 6  5 - 15 Final   Performed at [facility], [place] [address] [place], [place] [address]  . Alcohol, Ethyl (B) 07/29/2020 <10  <10 mg/dL Final   Comment: (NOTE) Lowest detectable limit for serum alcohol is 10 mg/dL.  For medical purposes only. Performed at [facility], [place] [address], [place], [place] [address]   . Opiates 07/29/2020 NONE DETECTED  NONE DETECTED Final  . Cocaine 07/29/2020 NONE DETECTED  NONE DETECTED Final  . Benzodiazepines 07/29/2020 NONE DETECTED  NONE DETECTED Final  . Amphetamines 07/29/2020 NONE DETECTED  NONE DETECTED Final  . Tetrahydrocannabinol 07/29/2020 NONE DETECTED  NONE DETECTED Final  . Barbiturates 07/29/2020 POSITIVE* NONE DETECTED Final   Comment: (NOTE) DRUG SCREEN FOR MEDICAL PURPOSES ONLY.  IF CONFIRMATION IS NEEDED FOR ANY PURPOSE, NOTIFY LAB WITHIN 5 DAYS.  LOWEST DETECTABLE LIMITS FOR URINE DRUG SCREEN Drug Class                     Cutoff (ng/mL) Amphetamine and metabolites    1000 Barbiturate and metabolites    200 Benzodiazepine                 [id number] Tricyclics and metabolites     300 Opiates and metabolites        300 Cocaine and metabolites        300 THC                            50 Performed at [facility], [place] [address], [place], [place] [address]   . WBC 07/29/2020 8.7  4.0 - 10.5 K/uL Final  . RBC 07/29/2020 4.31  3.87 - 5.11 MIL/uL Final  . Hemoglobin 07/29/2020 10.5* 12.0 - 15.0 g/dL Final  . HCT 07/29/2020 33.6* 36.0 - 46.0 % Final  . MCV 07/29/2020 78.0* 80.0 - 100.0 fL Final  . MCH 07/29/2020 24.4* 26.0 - 34.0 pg Final  . MCHC 07/29/2020 31.3  30.0 - 36.0 g/dL Final  . RDW 07/29/2020 15.0  11.5 - 15.5 % Final  . Platelets 07/29/2020  260  150 - 400 K/uL Final  . nRBC 07/29/2020 0.0  0.0 - 0.2 % Final  . Neutrophils Relative % 07/29/2020 45  % Final  . Neutro Abs 07/29/2020 3.9  1.7 - 7.7 K/uL Final  . Lymphocytes Relative 07/29/2020 44  % Final  . Lymphs Abs 07/29/2020 3.8  0.7 - 4.0 K/uL Final  . Monocytes Relative 07/29/2020 6  % Final  . Monocytes Absolute 07/29/2020 0.5  0.1 - 1.0 K/uL Final  . Eosinophils Relative 07/29/2020 4  % Final  . Eosinophils Absolute 07/29/2020 0.3  0.0 - 0.5 K/uL Final  . Basophils Relative 07/29/2020 1  % Final  . Basophils Absolute 07/29/2020 0.1  0.0 - 0.1 K/uL Final  . Immature Granulocytes 07/29/2020 0  % Final  . Abs Immature Granulocytes 07/29/2020 0.02  0.00 - 0.07 K/uL Final   Performed at [facility], [place] [address], [place], [place] [address]  . I-stat hCG, quantitative 07/29/2020 <5.0  <5 mIU/mL Final  . Comment 3 07/29/2020          Final   Comment:   GEST. AGE      CONC.  (mIU/mL)   <=1 WEEK        5 -  50     2 WEEKS       50 - 500     3 WEEKS       100 - 10,000     4 WEEKS     1,000 - 30,000        FEMALE AND NON-PREGNANT FEMALE:     LESS THAN 5 mIU/mL   Admission on 07/23/2020, Discharged on 07/24/2020  Component Date Value Ref Range Status  . Glucose-Capillary 07/23/2020 100* 70 - 99 mg/dL Final   Glucose reference range applies only to samples taken after fasting for at least 8 hours.  Marland Kitchen SARS Coronavirus 2 by RT PCR 07/23/2020 NEGATIVE  NEGATIVE Final   Comment: (NOTE) SARS-CoV-2 target nucleic acids are NOT DETECTED.  The SARS-CoV-2 RNA is generally detectable in upper respiratory specimens during the acute phase of infection. The lowest concentration of SARS-CoV-2 viral copies this assay can detect is 138 copies/mL. A negative result does not preclude SARS-Cov-2 infection and should not be used as the sole basis for treatment or other patient management decisions. A negative result may occur with  improper specimen collection/handling,  submission of specimen other than nasopharyngeal swab, presence of viral mutation(s) within the areas targeted by this assay, and inadequate number of viral copies(<138 copies/mL). A negative result must be combined with clinical observations, patient history, and epidemiological information. The expected result is Negative.  Fact Sheet for Patients:  EntrepreneurPulse.com.au  Fact Sheet for Healthcare Providers:  IncredibleEmployment.be  This test is no                          t yet approved or cleared by the [place] FDA and  has been authorized for detection and/or diagnosis of SARS-CoV-2 by FDA under an Emergency Use Authorization (EUA). This EUA will remain  in effect (meaning this test can be used) for the duration of the COVID-19 declaration under Section 564(b)(1) of the Act, 21 U.S.C.section 360bbb-3(b)(1), unless the authorization is terminated  or revoked sooner.      . Influenza A by PCR 07/23/2020 NEGATIVE  NEGATIVE Final  . Influenza B by PCR 07/23/2020 NEGATIVE  NEGATIVE Final   Comment: (NOTE) The Xpert Xpress SARS-CoV-2/FLU/RSV plus assay is intended as an aid in the diagnosis of influenza from Nasopharyngeal swab specimens and should not be used as a sole basis for treatment. Nasal washings and aspirates are unacceptable for Xpert Xpress SARS-CoV-2/FLU/RSV testing.  Fact Sheet for Patients: EntrepreneurPulse.com.au  Fact Sheet for Healthcare Providers: IncredibleEmployment.be  This test is not yet approved or cleared by the [place] FDA and has been authorized for detection and/or diagnosis of SARS-CoV-2 by FDA under an Emergency Use Authorization (EUA). This EUA will remain in effect (meaning this test can be used) for the duration of the COVID-19 declaration under Section 564(b)(1) of the Act, 21 U.S.C. section 360bbb-3(b)(1), unless the authorization is terminated  or revoked.  Performed at [facility], [place] [address] [place], [place] [address]   . Sodium 07/23/2020 139  135 - 145 mmol/L Final  . Potassium 07/23/2020 3.9  3.5 - 5.1 mmol/L Final  . Chloride 07/23/2020 107  98 - 111 mmol/L Final  . CO2 07/23/2020 24  22 - 32 mmol/L Final  . Glucose, Bld 07/23/2020 88  70 - 99 mg/dL Final   Glucose reference range applies only to samples taken after fasting for at least 8 hours.  . BUN 07/23/2020 9  6 -  20 mg/dL Final  . Creatinine, Ser 07/23/2020 0.81  0.44 - 1.00 mg/dL Final  . Calcium 07/23/2020 9.2  8.9 - 10.3 mg/dL Final  . Total Protein 07/23/2020 7.6  6.5 - 8.1 g/dL Final  . Albumin 07/23/2020 3.9  3.5 - 5.0 g/dL Final  . AST 07/23/2020 19  15 - 41 U/L Final  . ALT 07/23/2020 16  0 - 44 U/L Final  . Alkaline Phosphatase 07/23/2020 50  38 - 126 U/L Final  . Total Bilirubin 07/23/2020 0.2* 0.3 - 1.2 mg/dL Final  . GFR, Estimated 07/23/2020 >60  >60 mL/min Final   Comment: (NOTE) Calculated using the CKD-EPI Creatinine Equation (2021)   . Anion gap 07/23/2020 8  5 - 15 Final   Performed at [facility], [place] [address], [place], [place] [address]  . Alcohol, Ethyl (B) 07/23/2020 <10  <10 mg/dL Final   Comment: (NOTE) Lowest detectable limit for serum alcohol is 10 mg/dL.  For medical purposes only. Performed at [facility], [place] [address] [place], [place] [address]   . Opiates 07/23/2020 NONE DETECTED  NONE DETECTED Final  . Cocaine 07/23/2020 NONE DETECTED  NONE DETECTED Final  . Benzodiazepines 07/23/2020 NONE DETECTED  NONE DETECTED Final  . Amphetamines 07/23/2020 NONE DETECTED  NONE DETECTED Final  . Tetrahydrocannabinol 07/23/2020 NONE DETECTED  NONE DETECTED Final  . Barbiturates 07/23/2020 POSITIVE* NONE DETECTED Final   Comment: (NOTE) DRUG SCREEN FOR MEDICAL PURPOSES ONLY.  IF CONFIRMATION IS NEEDED FOR ANY PURPOSE, NOTIFY LAB WITHIN 5 DAYS.  LOWEST DETECTABLE  LIMITS FOR URINE DRUG SCREEN Drug Class                     Cutoff (ng/mL) Amphetamine and metabolites    1000 Barbiturate and metabolites    200 Benzodiazepine                 [id number] Tricyclics and metabolites     300 Opiates and metabolites        300 Cocaine and metabolites        300 THC                            50 Performed at [facility], [place] [address], [place], [place] [address]   . WBC 07/23/2020 11.0* 4.0 - 10.5 K/uL Final  . RBC 07/23/2020 4.72  3.87 - 5.11 MIL/uL Final  . Hemoglobin 07/23/2020 11.5* 12.0 - 15.0 g/dL Final  . HCT 07/23/2020 36.7  36.0 - 46.0 % Final  . MCV 07/23/2020 77.8* 80.0 - 100.0 fL Final  . MCH 07/23/2020 24.4* 26.0 - 34.0 pg Final  . MCHC 07/23/2020 31.3  30.0 - 36.0 g/dL Final  . RDW 07/23/2020 14.9  11.5 - 15.5 % Final  . Platelets 07/23/2020 282  150 - 400 K/uL Final  . nRBC 07/23/2020 0.0  0.0 - 0.2 % Final  . Neutrophils Relative % 07/23/2020 61  % Final  . Neutro Abs 07/23/2020 6.7  1.7 - 7.7 K/uL Final  . Lymphocytes Relative 07/23/2020 29  % Final  . Lymphs Abs 07/23/2020 3.2  0.7 - 4.0 K/uL Final  . Monocytes Relative 07/23/2020 5  % Final  . Monocytes Absolute 07/23/2020 0.6  0.1 - 1.0 K/uL Final  . Eosinophils Relative 07/23/2020 4  % Final  . Eosinophils Absolute 07/23/2020 0.4  0.0 - 0.5 K/uL Final  . Basophils Relative 07/23/2020  1  % Final  . Basophils Absolute 07/23/2020 0.1  0.0 - 0.1 K/uL Final  . Immature Granulocytes 07/23/2020 0  % Final  . Abs Immature Granulocytes 07/23/2020 0.03  0.00 - 0.07 K/uL Final   Performed at [facility], [place] [address] [place], [place] [address]  . I-stat hCG, quantitative 07/23/2020 <5.0  <5 mIU/mL Final  . Comment 3 07/23/2020          Final   Comment:   GEST. AGE      CONC.  (mIU/mL)   <=1 WEEK        5 - 50     2 WEEKS       50 - 500     3 WEEKS       100 - 10,000     4 WEEKS     1,000 - 30,000        FEMALE AND NON-PREGNANT FEMALE:     LESS THAN  5 mIU/mL   . Lithium Lvl 07/23/2020 0.42* 0.60 - 1.20 mmol/L Final   Performed at [place] [address], [place], [place] [address]  . Glucose-Capillary 07/24/2020 143* 70 - 99 mg/dL Final   Glucose reference range applies only to samples taken after fasting for at least 8 hours.  There may be more visits with results that are not included.    Allergies: Wellbutrin [bupropion], Omnipaque [iohexol], and Penicillin g  PTA Medications: (Not in a hospital admission)   Medical Decision Making    Patient is a 30 year old female involuntarily committed by her ACT team to [facility] for suicidal ideations.  Based on patient presentation during my assessment and continued endorsement of SI and TTS assessment while in the ED, this patient meets the criteria for inpatient psychiatric treatment for crisis stabilization.   Recommendations  Based on my evaluation the patient does not appear to have an emergency medical condition.  Patient was medically cleared in the ED and patient will be admitted to [facility] while waiting for inpatient psychiatric placement. ED labs were reviewed.  Will continue the following home meds:  Amlodipine 5 mg 1 tablet daily,  Cogentin 1 mg 2 times daily,  Vitamin D 1000 units daily,  Haldol 10 mg 1 tablet at bedtime,  Levemir 40 units 2 times daily, lithium 450 mg daily, Protonix 40 mg tabs daily, prazosin 5mg  tablet qhs,  simvastatin 20 mg tab  Trazodone 50 mg nightly as needed as needed for sleep Hydroxyzine 25 mg tid prn for anxiety Depakote not ordered due to patient c/o frequent urination when taking the medication and she does not want to restart med at this time. Lucia Bitter, NP 09/14/20  3:03 AM

## 2020-09-14 NOTE — ED Provider Notes (Signed)
ED ECG REPORT   Date: 09/14/2020 0015  Rate: 114  Rhythm: sinus tachycardia  QRS Axis: right  Intervals: normal  ST/T Wave abnormalities: nonspecific ST changes  Conduction Disutrbances:none  Narrative Interpretation:   Old EKG Reviewed: unchanged  I have personally reviewed the EKG tracing and agree with the computerized printout as noted.   Patient has been accepted at the behavioral health urgent care.  She appears mildly anxious.  Otherwise she is in no acute distress.  She has already been medically evaluated by the previous emergency team. Patient will be transferred  The patient appears reasonably stabilized for transfer considering the current resources, flow, and capabilities available in the ED at this time, and I doubt any other [facility] requiring further screening and/or treatment in the ED prior to transfer.    Ripley Fraise, MD 09/14/20 [phone]

## 2020-09-14 NOTE — ED Notes (Signed)
Pt is lying in bed quietly, breathing unlabored, environment secured, will continue to monitor patient

## 2020-09-14 NOTE — Discharge Instructions (Signed)
Patient is instructed prior to discharge to:  Take all medications as prescribed by his/her mental healthcare provider. Report any adverse effects and or reactions from the medicines to his/her outpatient provider promptly. Keep all scheduled appointments, to ensure that you are getting refills on time and to avoid any interruption in your medication.  If you are unable to keep an appointment call to reschedule.  Be sure to follow-up with resources and follow-up appointments provided.  Patient has been instructed & cautioned: To not engage in alcohol and or illegal drug use while on prescription medicines. In the event of worsening symptoms, patient is instructed to call the crisis hotline, 911 and or go to the nearest ED for appropriate evaluation and treatment of symptoms. To follow-up with his/her primary care provider for your other medical issues, concerns and or health care needs.    
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
## 2020-09-14 NOTE — ED Notes (Signed)
Patient arrived on unit.
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
## 2020-09-14 NOTE — ED Notes (Signed)
EKG in progress per order

## 2020-09-14 NOTE — Progress Notes (Signed)
[name] was discharged and refused her AVS, This writer suggested she get off the bus one stop before or after her designated stop. She received her personal belongings and was discharged without incident.

## 2020-09-14 NOTE — ED Notes (Signed)
Pt A&O x 4, under IVC, transfer from [facility], presents with suicidal ideations, plan to overdose on meds.  Pt calm & cooperative at present.  Skin search completed, monitoring for safety. No distress noted at present.

## 2020-09-14 NOTE — ED Notes (Signed)
[employer] called for transport EMTALA documentation completed

## 2020-09-14 NOTE — ED Notes (Signed)
Pt being transported to [facility] by [employer] officers x2 all belongs sent and pt transfer signed
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
## 2020-09-18 ENCOUNTER — Encounter (HOSPITAL_COMMUNITY): Payer: Self-pay

## 2020-09-18 ENCOUNTER — Emergency Department (HOSPITAL_COMMUNITY)
Admission: EM | Admit: 2020-09-18 | Discharge: 2020-09-19 | Disposition: A | Discharge status: Home / Self Care | Payer: Medicaid Other | Attending: Emergency Medicine | Admitting: Emergency Medicine

## 2020-09-18 ENCOUNTER — Other Ambulatory Visit: Payer: Self-pay

## 2020-09-18 DIAGNOSIS — Z79899 Other long term (current) drug therapy: Secondary | ICD-10-CM | POA: Insufficient documentation

## 2020-09-18 DIAGNOSIS — T43592A Poisoning by other antipsychotics and neuroleptics, intentional self-harm, initial encounter: Secondary | ICD-10-CM | POA: Diagnosis not present

## 2020-09-18 DIAGNOSIS — R45851 Suicidal ideations: Secondary | ICD-10-CM | POA: Diagnosis not present

## 2020-09-18 DIAGNOSIS — E119 Type 2 diabetes mellitus without complications: Secondary | ICD-10-CM | POA: Insufficient documentation

## 2020-09-18 DIAGNOSIS — Z794 Long term (current) use of insulin: Secondary | ICD-10-CM | POA: Diagnosis not present

## 2020-09-18 DIAGNOSIS — T1491XA Suicide attempt, initial encounter: Secondary | ICD-10-CM | POA: Diagnosis present

## 2020-09-18 DIAGNOSIS — Z20822 Contact with and (suspected) exposure to covid-19: Secondary | ICD-10-CM | POA: Insufficient documentation

## 2020-09-18 DIAGNOSIS — I10 Essential (primary) hypertension: Secondary | ICD-10-CM | POA: Insufficient documentation

## 2020-09-18 DIAGNOSIS — F17210 Nicotine dependence, cigarettes, uncomplicated: Secondary | ICD-10-CM | POA: Insufficient documentation

## 2020-09-18 DIAGNOSIS — F332 Major depressive disorder, recurrent severe without psychotic features: Secondary | ICD-10-CM | POA: Diagnosis present

## 2020-09-18 DIAGNOSIS — X58XXXA Exposure to other specified factors, initial encounter: Secondary | ICD-10-CM | POA: Insufficient documentation

## 2020-09-18 LAB — CBC
HCT: 34.9 % — ABNORMAL LOW (ref 36.0–46.0)
Hemoglobin: 11.1 g/dL — ABNORMAL LOW (ref 12.0–15.0)
MCH: 24.5 pg — ABNORMAL LOW (ref 26.0–34.0)
MCHC: 31.8 g/dL (ref 30.0–36.0)
MCV: 77 fL — ABNORMAL LOW (ref 80.0–100.0)
Platelets: 228 K/uL (ref 150–400)
RBC: 4.53 MIL/uL (ref 3.87–5.11)
RDW: 16.2 % — ABNORMAL HIGH (ref 11.5–15.5)
WBC: 7 K/uL (ref 4.0–10.5)
nRBC: 0 % (ref 0.0–0.2)

## 2020-09-18 LAB — RAPID URINE DRUG SCREEN, HOSP PERFORMED
Amphetamines: NOT DETECTED
Barbiturates: POSITIVE — AB
Benzodiazepines: NOT DETECTED
Cocaine: NOT DETECTED
Opiates: NOT DETECTED
Tetrahydrocannabinol: NOT DETECTED

## 2020-09-18 LAB — I-STAT BETA HCG BLOOD, ED (MC, WL, AP ONLY): I-stat hCG, quantitative: <5 m[IU]/mL (ref <5)

## 2020-09-18 LAB — COMPREHENSIVE METABOLIC PANEL
ALT: 14 U/L (ref 0–44)
AST: 21 U/L (ref 15–41)
Albumin: 3.7 g/dL (ref 3.5–5.0)
Alkaline Phosphatase: 47 U/L (ref 38–126)
Anion gap: 6 (ref 5–15)
BUN: 8 mg/dL (ref 6–20)
CO2: 25 mmol/L (ref 22–32)
Calcium: 8.9 mg/dL (ref 8.9–10.3)
Chloride: 110 mmol/L (ref 98–111)
Creatinine, Ser: 0.91 mg/dL (ref 0.44–1.00)
GFR, Estimated: >60 mL/min (ref >60)
Glucose, Bld: 152 mg/dL — ABNORMAL HIGH (ref 70–99)
Potassium: 3.8 mmol/L (ref 3.5–5.1)
Sodium: 141 mmol/L (ref 135–145)
Total Bilirubin: 0.1 mg/dL — ABNORMAL LOW (ref 0.3–1.2)
Total Protein: 7.4 g/dL (ref 6.5–8.1)

## 2020-09-18 LAB — SALICYLATE LEVEL: Salicylate Lvl: <7 mg/dL — ABNORMAL LOW (ref 7.0–30.0)

## 2020-09-18 LAB — VALPROIC ACID LEVEL: Valproic Acid Lvl: 68 ug/mL (ref 50.0–100.0)

## 2020-09-18 LAB — ACETAMINOPHEN LEVEL
Acetaminophen (Tylenol), Serum: <10 ug/mL — ABNORMAL LOW (ref 10–30)
Acetaminophen (Tylenol), Serum: <10 ug/mL — ABNORMAL LOW (ref 10–30)

## 2020-09-18 LAB — RESP PANEL BY RT-PCR (FLU A&B, COVID) ARPGX2
Influenza A by PCR: NEGATIVE
Influenza B by PCR: NEGATIVE
SARS Coronavirus 2 by RT PCR: NEGATIVE

## 2020-09-18 LAB — ETHANOL: Alcohol, Ethyl (B): <10 mg/dL (ref <10)

## 2020-09-18 MED ORDER — LORAZEPAM 2 MG/ML IJ SOLN
2.0000 mg | Freq: Once | INTRAMUSCULAR | Status: AC
  Administered 2020-09-18: 2 mg via INTRAMUSCULAR
  Filled 2020-09-18: qty 1

## 2020-09-18 NOTE — ED Notes (Signed)
PT items are behind nurses station (9rm-12rm). Pt hade a purse

## 2020-09-18 NOTE — ED Notes (Signed)
Patient changed out into scrubs from hospital gown.   Patient eating and drinking. Tolerating oral hydration well. Patient has voided X2 since here.

## 2020-09-18 NOTE — ED Triage Notes (Signed)
Patient arrived via [facility] from home.   ACT team called ems for possible suicide attempt.   On arrival ems state patient took multiple of her prescription medication to feel "loopy" per ems.   Patient was asked if she had a plan she said, "yeah if this doesn't work I am going to start cutting myself".   Patient states she took hydroxyzine, Depakote, and can't recall what else.   Per ems there is a different answer every time.   While in transport patient was talking to herself and looking off into space.    A/Ox4 Ambulatory triage.   BP-154/100 P-104 RR-18 99% RA CBG-173 T-97.7

## 2020-09-18 NOTE — ED Provider Notes (Signed)
[place] DEPT Provider Note   CSN: [id number] Arrival date & time: 09/18/20  1622     History Chief Complaint  Patient presents with  . Suicidal  . Ingestion    [name] is a 30 y.o. female.  Patient states around 3;30 today she took some medicines to hurt herself.  She wants to hurt her self and she wants to hurt her boyfriend.  Patient has a history of schizophrenia.  The history is provided by the patient and medical records. No language interpreter was used.  Ingestion This is a new problem. The current episode started 6 to 12 hours ago. The problem occurs rarely. The problem has been resolved. Pertinent negatives include no chest pain, no abdominal pain and no headaches. Nothing aggravates the symptoms. Nothing relieves the symptoms. She has tried nothing for the symptoms.       Past Medical History:  Diagnosis Date  . Anxiety   . Bipolar 1 disorder ([place])   . Cognitive deficits   . Depression   . Diabetes mellitus without complication ([place])   . Hypertension   . Mental disorder   . Mental health disorder   . Obesity     Patient Active Problem List   Diagnosis Date Noted  . Syphilis 07/15/2020  . Malingering 06/05/2020  . Gastroesophageal reflux disease 05/04/2020  . Hyperglycemia due to type 2 diabetes mellitus ([place]) 05/04/2020  . Long term (current) use of insulin ([place]) 05/04/2020  . Migraine without aura 05/04/2020  . Morbid obesity ([place]) 05/04/2020  . 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 2 diabetes mellitus ([place]) 05/04/2020  . Prolapsed internal hemorrhoids 05/04/2020  . Vitamin D deficiency 05/04/2020  . Other symptoms and signs involving cognitive functions and awareness 05/04/2020  . Suicide attempt ([place])   . Anxiety state 03/06/2020  . Schizophrenia ([place]) 09/13/2019  . Bipolar I disorder, most recent episode depressed ([place]) 06/23/2019  . MDD (major depressive disorder) 10/10/2018  . Schizoaffective disorder, bipolar  type ([place]) 09/25/2018  . Affective psychosis, bipolar ([place]) 06/13/2018  . HTN (hypertension) 05/03/2018  . Tobacco use disorder 05/03/2018  . Adjustment disorder with emotional disturbance 01/02/2018  . 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 Moderate bipolar I disorder, most recent episode depressed ([place])   . Psychosis ([place])   . 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 OCD (obsessive compulsive disorder) 10/05/2016  . Major depressive disorder, recurrent episode, mild ([place]) 05/04/2016  . 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 Learning disability 07/18/2015  . 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 Diabetes mellitus ([place]) 07/18/2015  . 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 Hyperlipidemia 07/18/2015  . Severe episode of recurrent major depressive disorder, without psychotic features ([place])   . Suicidal ideation   . Drug overdose   . Cognitive deficits 10/12/2012  . Generalized anxiety disorder 06/28/2012    Past Surgical History:  Procedure Laterality Date  . CESAREAN SECTION    . CESAREAN SECTION N/A 04/25/2013   Procedure: REPEAT CESAREAN SECTION;  Surgeon: [name], MD;  Location: [facility];  Service: Obstetrics;  Laterality: N/A;  . MASS EXCISION N/A 06/03/2012   Procedure: EXCISION MASS;  Surgeon: [name], MD;  Location: [facility];  Service: ENT;  Laterality: N/A;  Excision uvula mass  . TONSILLECTOMY N/A 06/03/2012   Procedure: TONSILLECTOMY;  Surgeon: [name], MD;  Location: [facility];  Service: ENT;  Laterality: N/A;  . TONSILLECTOMY       OB History    Gravida  3   Para  3   Term  3   Preterm  0   AB  0   Living  3     SAB  0   IAB  0   Ectopic  0   Multiple      Live Births  3           Family History  Problem Relation Age of Onset  . Hypertension Mother   .  Diabetes Father     Social History   Tobacco Use  . Smoking status: Current Every Day Smoker    Packs/day: 4.00    Years: 11.00    Pack years: 44.00    Types: Cigarettes, Cigars  . Smokeless tobacco: Never Used  . Tobacco comment: Pt declined  Vaping Use  . Vaping Use: Never used  Substance Use Topics  . Alcohol use: Not Currently    Comment: occ: last intake 4 mts ago  . Drug use: Not Currently    Types: "Crack" cocaine, Other-see comments    Comment: Patient reports hx of smoking Crack    Home Medications Prior to Admission medications   Medication Sig Start Date End Date Taking? Authorizing Provider  amLODipine (NORVASC) 5 MG tablet Take 5 mg by mouth daily.    [provider]  benztropine (COGENTIN) 1 MG tablet Take 1 mg by mouth 2 (two) times daily. 05/01/20   [provider]  cholecalciferol (VITAMIN D) 25 MCG (1000 UNIT) tablet Take 1,000 Units by mouth daily. 04/30/20   [provider]  divalproex (DEPAKOTE ER) 500 MG 24 hr tablet Take 500 mg by mouth in the morning, at noon, and at bedtime.    [provider]  divalproex (DEPAKOTE) 500 MG DR tablet Take 2 tablets (1,000 mg total) by mouth every 12 (twelve) hours. 09/11/20   Money, Lowry Ram, FNP  haloperidol (HALDOL) 10 MG tablet Take 10 mg by mouth at bedtime. 05/09/20   [provider]  haloperidol decanoate (HALDOL DECANOATE) 100 MG/ML injection Inject 150 mg into the muscle every 28 (twenty-eight) days. Inject 150mg  (1.64ml) every 28 days.    [provider]  hydrOXYzine (ATARAX/VISTARIL) 25 MG tablet Take 1 tablet (25 mg total) by mouth 3 (three) times daily as needed for anxiety. Patient not taking: No sig reported 05/27/20   Derrill Center, NP  hydrOXYzine (ATARAX/VISTARIL) 25 MG tablet Take 25 mg by mouth 3 (three) times daily.    [provider]  insulin detemir (LEVEMIR) 100 UNIT/ML injection Inject 0.4 mLs (40 Units total) into the skin 2 (two) times daily.  05/05/20   Montine Circle, PA-C  lithium carbonate (ESKALITH) 450 MG CR tablet Take 450 mg by mouth daily.    [provider]  omeprazole (PRILOSEC) 20 MG capsule Take 20 mg by mouth daily.    [provider]  ondansetron (ZOFRAN) 4 MG tablet Take 1 tablet (4 mg total) by mouth every 6 (six) hours. 09/07/20   Peri Jefferson, PA-C  prazosin (MINIPRESS) 5 MG capsule Take 5 mg by mouth at bedtime.    [provider]  primidone (MYSOLINE) 50 MG tablet Take 50 mg by mouth 3 (three) times daily.    [provider]  simvastatin (ZOCOR) 20 MG tablet Take 20 mg by mouth daily.    [provider]  vitamin B-12 (CYANOCOBALAMIN) 1000 MCG tablet Take  1,000 mcg by mouth daily. 11/13/19   [provider]    Allergies    Wellbutrin [bupropion], Omnipaque [iohexol], and Penicillin g  Review of Systems   Review of Systems  Constitutional: Negative for appetite change and fatigue.  HENT: Negative for congestion, ear discharge and sinus pressure.   Eyes: Negative for discharge.  Respiratory: Negative for cough.   Cardiovascular: Negative for chest pain.  Gastrointestinal: Negative for abdominal pain and diarrhea.  Genitourinary: Negative for frequency and hematuria.  Musculoskeletal: Negative for back pain.  Skin: Negative for rash.  Neurological: Negative for seizures and headaches.  Psychiatric/Behavioral: Negative for hallucinations.       Suicidal    Physical Exam Updated Vital Signs BP (!) 134/92   Pulse 89   Temp 98.1 F (36.7 C) (Oral)   Resp (!) 22   LMP 09/15/2020   SpO2 100%   Physical Exam Vitals and nursing note reviewed.  Constitutional:      Appearance: She is well-developed.  HENT:     Head: Normocephalic.     Nose: Nose normal.  Eyes:     General: No scleral icterus.    Conjunctiva/sclera: Conjunctivae normal.  Neck:     Thyroid: No thyromegaly.  Cardiovascular:     Rate and Rhythm: Normal rate and regular rhythm.      Heart sounds: No murmur heard. No friction rub. No gallop.   Pulmonary:     Breath sounds: No stridor. No wheezing or rales.  Chest:     Chest wall: No tenderness.  Abdominal:     General: There is no distension.     Tenderness: There is no abdominal tenderness. There is no rebound.  Musculoskeletal:        General: Normal range of motion.     Cervical back: Neck supple.  Lymphadenopathy:     Cervical: No cervical adenopathy.  Skin:    Findings: No erythema or rash.  Neurological:     Mental Status: She is alert and oriented to person, place, and time.     Motor: No abnormal muscle tone.     Coordination: Coordination normal.  Psychiatric:     Comments: Patient is suicidal     ED Results / Procedures / Treatments   Labs (all labs ordered are listed, but only abnormal results are displayed) Labs Reviewed  COMPREHENSIVE METABOLIC PANEL - Abnormal; Notable for the following components:      Result Value   Glucose, Bld 152 (*)    Total Bilirubin 0.1 (*)    All other components within normal limits  SALICYLATE LEVEL - Abnormal; Notable for the following components:   Salicylate Lvl <3.8 (*)    All other components within normal limits  ACETAMINOPHEN LEVEL - Abnormal; Notable for the following components:   Acetaminophen (Tylenol), Serum <10 (*)    All other components within normal limits  CBC - Abnormal; Notable for the following components:   Hemoglobin 11.1 (*)    HCT 34.9 (*)    MCV 77.0 (*)    MCH 24.5 (*)    RDW 16.2 (*)    All other components within normal limits  RAPID URINE DRUG SCREEN, HOSP PERFORMED - Abnormal; Notable for the following components:   Barbiturates POSITIVE (*)    All other components within normal limits  ACETAMINOPHEN LEVEL - Abnormal; Notable for the following components:   Acetaminophen (Tylenol), Serum <10 (*)    All other components within normal limits  ETHANOL  VALPROIC ACID LEVEL  I-STAT BETA HCG BLOOD, ED ([facility], [facility], [facility] ONLY)     EKG None  Radiology No results found.  Procedures Procedures   Medications Ordered in ED Medications  LORazepam (ATIVAN) injection 2 mg (has no administration in time range)   CRITICAL CARE Performed by: Milton Ferguson Total critical care time: 40 minutes Critical care time was exclusive of separately billable procedures and treating other patients. Critical care was necessary to treat or prevent imminent or life-threatening deterioration. Critical care was time spent personally by me on the following activities: development of treatment plan with patient and/or surrogate as well as nursing, discussions with consultants, evaluation of patient's response to treatment, examination of patient, obtaining history from patient or surrogate, ordering and performing treatments and interventions, ordering and review of laboratory studies, ordering and review of radiographic studies, pulse oximetry and re-evaluation of patient's condition.  ED Course  I have reviewed the triage vital signs and the nursing notes.  Pertinent labs & imaging results that were available during my care of the patient were reviewed by me and considered in my medical decision making (see chart for details). Labs unremarkable.  Patient was observed for a number of hours.  She is medically cleared and will be evaluated by behavioral health   MDM Rules/Calculators/A&P                          Suicidal ideation.  Patient medically cleared and will be evaluated by behavioral health Final Clinical Impression(s) / ED Diagnoses Final diagnoses:  None    Rx / DC Orders ED Discharge Orders    None       Milton Ferguson, MD 09/18/20 2138

## 2020-09-18 NOTE — ED Notes (Signed)
Per EDP ok for patient to have food.   Patient given meal tray.

## 2020-09-18 NOTE — ED Notes (Signed)
Patients belonging placed in cabinets 23-25.   Patient changed into gown.   Cabinets in room locked.

## 2020-09-18 NOTE — ED Notes (Signed)
Patient is starting to get agitated. Patient states she is going to go home and overdose. Patient is really aggressive. MD notified
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
## 2020-09-18 NOTE — ED Notes (Signed)
Pt has one pt-bag in locker 16-18

## 2020-09-18 NOTE — ED Notes (Signed)
Attempted to call poison control X3. Placed on hold multiple times.

## 2020-09-18 NOTE — ED Notes (Addendum)
Spoke with poison control- [name]  Benztropine could be the tremor medication patient was talking about that she took.  Look for anticholinergic effect.  Keep an eye on temperature.  Monitor eye dilation.  Make sure patient is voiding.  May have sleepiness, agitation, and sometimes hallucination.   Depakote- Could cause sleepiness  Need valproic acid level  Monitor for QTC prolongation. If over 500mg  optimize the magnesium and potassium.   Need 4 hour post injection acetomenophine level.   Hydroxyzine- Monitor for QRS widening.   Benzos for agitation or seizures.   Recommends hydration.

## 2020-09-19 NOTE — BH Assessment (Addendum)
[place] Assessment Progress Note  Per Thomes Lolling, NP, this voluntary pt does not require psychiatric hospitalization at this time.  Pt is psychiatrically cleared.  Discharge instructions advise pt to continue treatment with the [facility].  EDP Daleen Bo, MD and pt's nurse, Joellen Jersey, have been notified.  Jalene Mullet, MA Triage Specialist [phone]   Addendum:  At 11:17 this writer called the [facility] to notify them of disposition.  I spoke to Clever.  She reports that they will send someone to [facility] to pick pt up.  She will have them call me with an approximate time.  Return call is pending as of this writing.  Jalene Mullet, [place] [facility] Coordinator [phone]   Addendum:  At 11:51 I received a call from Lind Covert with the [facility].  She reports that she is in front of [address], ready to pick pt up.  At 11:52 I called Katie and notified her.  Jalene Mullet, [place] Coordinator [phone]

## 2020-09-19 NOTE — Progress Notes (Signed)
Patient has been seen by psychiatry. Case consulted with Dr. Dwyane Dee. Patient is psychiatrically cleared. Patient has out patient services with ACT team in place.

## 2020-09-19 NOTE — Consult Note (Signed)
Telepsych Consultation   Reason for Consult:  Psychiatric evaluation for SI Referring Physician:  Dr. Eulis Foster Location of Patient: [facility] Location of Provider: [facility]  Patient Identification: [name] MRN:  [id number] Principal Diagnosis: Suicide attempt [facility]) Diagnosis:  Principal Problem:   Suicide attempt [facility])   Total Time spent with patient: 20 minutes  Subjective:    [name] is a 30 y.o. female patient presenting to the [facility] with SI with a plan anattempting to kill herself by an overdose  on medication.  [name], 30 y.o., female patient seen via tele health by this provider, consulted with Dr. Dwyane Dee; and chart reviewed on 09/19/20.     HPI:   During evaluation [name] is in sitting position in no acute distress.  She makes good eye contact. She is alert, oriented x 4,  and cooperative. Her mood is depressed with congruent affect States she has no problem sleeping or any problem with her appetite. She does not appear to be responding to internal/external stimuli or delusional thoughts. Patient judgment and insight are poor. Patient denies homicidal ideation, psychosis, and paranoia.   Patient states she got angry, was crying and arguing with everyone. States she took 3-4 Depakote 3-4 Hydroxyzine and 3-4 of an unidentified medication. States if she goes home today, "I will go home, I will take pill, I will try to kill my self again". Patient states, "yall need to send me to [facility]". States her family does not want to talk to her anymore due to her mental illness.  Denies access to firearms/weapons but has access to pills. States she would like to live in a group home. ACT team is aware.  States she has ACT services in place with [facility]. Patient has frequent admissions with the [facility]. Patient is chronically suicidal and has multiple Suicide attempts. Patient has a long psychiatric history.   Discussed  coping skills with patient. States she likes to walk. Encouraged patient to walk when she feels stressors coming on.    Past Psychiatric History:  MDD, schizophrenia and multiple suicide attempts.   Risk to Self:  pt endorses  Risk to Others:  pt denies Prior Inpatient Therapy:  yes Prior Outpatient Therapy:  yes  Past Medical History:  Past Medical History:  Diagnosis Date  . Anxiety   . Bipolar 1 disorder ([place])   . Cognitive deficits   . Depression   . Diabetes mellitus without complication ([place])   . Hypertension   . Mental disorder   . Mental health disorder   . Obesity     Past Surgical History:  Procedure Laterality Date  . CESAREAN SECTION    . CESAREAN SECTION N/A 04/25/2013   Procedure: REPEAT CESAREAN SECTION;  Surgeon: [name], MD;  Location: [facility];  Service: Obstetrics;  Laterality: N/A;  . MASS EXCISION N/A 06/03/2012   Procedure: EXCISION MASS;  Surgeon: [name], MD;  Location: [place];  Service: ENT;  Laterality: N/A;  Excision uvula mass  . TONSILLECTOMY N/A 06/03/2012   Procedure: TONSILLECTOMY;  Surgeon: [name], MD;  Location: [place];  Service: ENT;  Laterality: N/A;  . TONSILLECTOMY     Family History:  Family History  Problem Relation Age of Onset  . Hypertension Mother   . Diabetes Father    Family Psychiatric  History:  unknown  Social History:  Social History   Substance and Sexual Activity  Alcohol  Use Not Currently   Comment: occ: last intake 4 mts ago     Social History   Substance and Sexual Activity  Drug Use Not Currently  . Types: "Crack" cocaine, Other-see comments   Comment: Patient reports hx of smoking Crack    Social History   Socioeconomic History  . Marital status: Single    Spouse name: Not on file  . Number of children: Not on file  . Years of education: Not on file  . Highest education level: Not on file  Occupational History  . Not on file  Tobacco Use  .  Smoking status: Current Every Day Smoker    Packs/day: 4.00    Years: 11.00    Pack years: 44.00    Types: Cigarettes, Cigars  . Smokeless tobacco: Never Used  . Tobacco comment: Pt declined  Vaping Use  . Vaping Use: Never used  Substance and Sexual Activity  . Alcohol use: Not Currently    Comment: occ: last intake 4 mts ago  . Drug use: Not Currently    Types: "Crack" cocaine, Other-see comments    Comment: Patient reports hx of smoking Crack  . Sexual activity: Yes    Birth control/protection: Implant  Other Topics Concern  . Not on file  Social History Narrative   ** Merged History Encounter **       Social Determinants of Health   Financial Resource Strain: Not on file  Food Insecurity: Not on file  Transportation Needs: Not on file  Physical Activity: Not on file  Stress: Not on file  Social Connections: Not on file   Additional Social History:    Allergies:   Allergies  Allergen Reactions  . Wellbutrin [Bupropion] Shortness Of Breath  . Omnipaque [Iohexol] Swelling and Other (See Comments)    Eye swelling  . Penicillin G Hives    Labs:  Results for orders placed or performed during the hospital encounter of 09/18/20 (from the past 48 hour(s))  Comprehensive metabolic panel     Status: Abnormal   Collection Time: 09/18/20  4:41 PM  Result Value Ref Range   Sodium 141 135 - 145 mmol/L   Potassium 3.8 3.5 - 5.1 mmol/L   Chloride 110 98 - 111 mmol/L   CO2 25 22 - 32 mmol/L   Glucose, Bld 152 (H) 70 - 99 mg/dL    Comment: Glucose reference range applies only to samples taken after fasting for at least 8 hours.   BUN 8 6 - 20 mg/dL   Creatinine, Ser 0.91 0.44 - 1.00 mg/dL   Calcium 8.9 8.9 - 10.3 mg/dL   Total Protein 7.4 6.5 - 8.1 g/dL   Albumin 3.7 3.5 - 5.0 g/dL   AST 21 15 - 41 U/L   ALT 14 0 - 44 U/L   Alkaline Phosphatase 47 38 - 126 U/L   Total Bilirubin 0.1 (L) 0.3 - 1.2 mg/dL   GFR, Estimated >60 >60 mL/min    Comment: (NOTE) Calculated  using the CKD-EPI Creatinine Equation (2021)    Anion gap 6 5 - 15    Comment: Performed at [facility], [place] [address] [place], [place] [address]  Ethanol     Status: None   Collection Time: 09/18/20  4:41 PM  Result Value Ref Range   Alcohol, Ethyl (B) <10 <10 mg/dL    Comment: (NOTE) Lowest detectable limit for serum alcohol is 10 mg/dL.  For medical purposes only. Performed at [facility], [place] [address], [place], [place] [address]   Salicylate level     Status: Abnormal   Collection Time: 09/18/20  4:41 PM  Result Value Ref Range   Salicylate Lvl <9.5 (L) 7.0 - 30.0 mg/dL    Comment: Performed at [facility], [place] [address] [place], [place] [address]  Acetaminophen level     Status: Abnormal   Collection Time: 09/18/20  4:41 PM  Result Value Ref Range   Acetaminophen (Tylenol), Serum <10 (L) 10 - 30 ug/mL    Comment: (NOTE) Therapeutic concentrations vary significantly. A range of 10-30 ug/mL  may be an effective concentration for many patients. However, some  are best treated at concentrations outside of this range. Acetaminophen concentrations >150 ug/mL at 4 hours after ingestion  and >50 ug/mL at 12 hours after ingestion are often associated with  toxic reactions.  Performed at [facility], [place] [address] [place], [place] [address]   cbc     Status: Abnormal   Collection Time: 09/18/20  4:41 PM  Result Value Ref Range   WBC 7.0 4.0 - 10.5 K/uL   RBC 4.53 3.87 - 5.11 MIL/uL   Hemoglobin 11.1 (L) 12.0 - 15.0 g/dL   HCT 34.9 (L) 36.0 - 46.0 %   MCV 77.0 (L) 80.0 - 100.0 fL   MCH 24.5 (L) 26.0 - 34.0 pg   MCHC 31.8 30.0 - 36.0 g/dL   RDW 16.2 (H) 11.5 - 15.5 %   Platelets 228 150 - 400 K/uL    Comment: REPEATED TO VERIFY   nRBC 0.0 0.0 - 0.2 %    Comment: Performed at [facility], [place] [address], [place], [place] [address]  Rapid urine drug screen (hospital  performed)     Status: Abnormal   Collection Time: 09/18/20  4:41 PM  Result Value Ref Range   Opiates NONE DETECTED NONE DETECTED   Cocaine NONE DETECTED NONE DETECTED   Benzodiazepines NONE DETECTED NONE DETECTED   Amphetamines NONE DETECTED NONE DETECTED   Tetrahydrocannabinol NONE DETECTED NONE DETECTED   Barbiturates POSITIVE (A) NONE DETECTED    Comment: (NOTE) DRUG SCREEN FOR MEDICAL PURPOSES ONLY.  IF CONFIRMATION IS NEEDED FOR ANY PURPOSE, NOTIFY LAB WITHIN 5 DAYS.  LOWEST DETECTABLE LIMITS FOR URINE DRUG SCREEN Drug Class                     Cutoff (ng/mL) Amphetamine and metabolites    1000 Barbiturate and metabolites    200 Benzodiazepine                 093 Tricyclics and metabolites     300 Opiates and metabolites        300 Cocaine and metabolites        300 THC                            50 Performed at [facility], [place] [address] [place], [place] [address]   Valproic acid level     Status: None   Collection Time: 09/18/20  4:41 PM  Result Value Ref Range   Valproic Acid Lvl 68 50.0 - 100.0 ug/mL    Comment: Performed at [facility], [place] [address], [place], [place] [address]  I-Stat beta hCG blood, ED     Status: None   Collection Time: 09/18/20  4:50 PM  Result Value Ref  Range   I-stat hCG, quantitative <5.0 <5 mIU/mL   Comment 3            Comment:   GEST. AGE      CONC.  (mIU/mL)   <=1 WEEK        5 - 50     2 WEEKS       50 - 500     3 WEEKS       100 - 10,000     4 WEEKS     1,000 - 30,000        FEMALE AND NON-PREGNANT FEMALE:     LESS THAN 5 mIU/mL   Acetaminophen level     Status: Abnormal   Collection Time: 09/18/20  8:15 PM  Result Value Ref Range   Acetaminophen (Tylenol), Serum <10 (L) 10 - 30 ug/mL    Comment: (NOTE) Therapeutic concentrations vary significantly. A range of 10-30 ug/mL  may be an effective concentration for many patients. However, some  are best treated at concentrations outside  of this range. Acetaminophen concentrations >150 ug/mL at 4 hours after ingestion  and >50 ug/mL at 12 hours after ingestion are often associated with  toxic reactions.  Performed at [facility], [place] [address], [place], [place] [address]   Resp Panel by RT-PCR (Flu A&B, Covid) Nasopharyngeal Swab     Status: None   Collection Time: 09/18/20  9:39 PM   Specimen: Nasopharyngeal Swab; Nasopharyngeal(NP) swabs in vial transport medium  Result Value Ref Range   SARS Coronavirus 2 by RT PCR NEGATIVE NEGATIVE    Comment: (NOTE) SARS-CoV-2 target nucleic acids are NOT DETECTED.  The SARS-CoV-2 RNA is generally detectable in upper respiratory specimens during the acute phase of infection. The lowest concentration of SARS-CoV-2 viral copies this assay can detect is 138 copies/mL. A negative result does not preclude SARS-Cov-2 infection and should not be used as the sole basis for treatment or other patient management decisions. A negative result may occur with  improper specimen collection/handling, submission of specimen other than nasopharyngeal swab, presence of viral mutation(s) within the areas targeted by this assay, and inadequate number of viral copies(<138 copies/mL). A negative result must be combined with clinical observations, patient history, and epidemiological information. The expected result is Negative.  Fact Sheet for Patients:  EntrepreneurPulse.com.au  Fact Sheet for Healthcare Providers:  IncredibleEmployment.be  This test is no t yet approved or cleared by the [place] FDA and  has been authorized for detection and/or diagnosis of SARS-CoV-2 by FDA under an Emergency Use Authorization (EUA). This EUA will remain  in effect (meaning this test can be used) for the duration of the COVID-19 declaration under Section 564(b)(1) of the Act, 21 U.S.C.section 360bbb-3(b)(1), unless the authorization is terminated   or revoked sooner.       Influenza A by PCR NEGATIVE NEGATIVE   Influenza B by PCR NEGATIVE NEGATIVE    Comment: (NOTE) The Xpert Xpress SARS-CoV-2/FLU/RSV plus assay is intended as an aid in the diagnosis of influenza from Nasopharyngeal swab specimens and should not be used as a sole basis for treatment. Nasal washings and aspirates are unacceptable for Xpert Xpress SARS-CoV-2/FLU/RSV testing.  Fact Sheet for Patients: EntrepreneurPulse.com.au  Fact Sheet for Healthcare Providers: IncredibleEmployment.be  This test is not yet approved or cleared by the [place] FDA and has been authorized for detection and/or diagnosis of SARS-CoV-2 by FDA under an Emergency Use Authorization (EUA). This EUA will remain in effect (  meaning this test can be used) for the duration of the COVID-19 declaration under Section 564(b)(1) of the Act, 21 U.S.C. section 360bbb-3(b)(1), unless the authorization is terminated or revoked.  Performed at [facility], [place] [address], [place], [place] [address]     Medications:  No current facility-administered medications for this encounter.   Current Outpatient Medications  Medication Sig Dispense Refill  . amLODipine (NORVASC) 5 MG tablet Take 5 mg by mouth daily.    . benztropine (COGENTIN) 1 MG tablet Take 1 mg by mouth 2 (two) times daily.    . cholecalciferol (VITAMIN D) 25 MCG (1000 UNIT) tablet Take 1,000 Units by mouth daily.    . divalproex (DEPAKOTE ER) 500 MG 24 hr tablet Take 500 mg by mouth in the morning, at noon, and at bedtime.    . divalproex (DEPAKOTE) 500 MG DR tablet Take 2 tablets (1,000 mg total) by mouth every 12 (twelve) hours.    . haloperidol (HALDOL) 10 MG tablet Take 10 mg by mouth at bedtime.    . haloperidol decanoate (HALDOL DECANOATE) 100 MG/ML injection Inject 150 mg into the muscle every 28 (twenty-eight) days. Inject 150mg  (1.60ml) every 28 days.    .  hydrOXYzine (ATARAX/VISTARIL) 25 MG tablet Take 1 tablet (25 mg total) by mouth 3 (three) times daily as needed for anxiety. (Patient not taking: No sig reported) 30 tablet 0  . hydrOXYzine (ATARAX/VISTARIL) 25 MG tablet Take 25 mg by mouth 3 (three) times daily.    . insulin detemir (LEVEMIR) 100 UNIT/ML injection Inject 0.4 mLs (40 Units total) into the skin 2 (two) times daily. 10 mL 11  . lithium carbonate (ESKALITH) 450 MG CR tablet Take 450 mg by mouth daily.    Marland Kitchen omeprazole (PRILOSEC) 20 MG capsule Take 20 mg by mouth daily.    . ondansetron (ZOFRAN) 4 MG tablet Take 1 tablet (4 mg total) by mouth every 6 (six) hours. 12 tablet 0  . prazosin (MINIPRESS) 5 MG capsule Take 5 mg by mouth at bedtime.    . primidone (MYSOLINE) 50 MG tablet Take 50 mg by mouth 3 (three) times daily.    . simvastatin (ZOCOR) 20 MG tablet Take 20 mg by mouth daily.    . vitamin B-12 (CYANOCOBALAMIN) 1000 MCG tablet Take 1,000 mcg by mouth daily.      Musculoskeletal: Strength & Muscle Tone: within normal limits Gait & Station: normal Patient leans: N/A  Psychiatric Specialty Exam: Physical Exam Vitals reviewed.  HENT:     Head: Normocephalic.     Right Ear: External ear normal.     Left Ear: External ear normal.  Eyes:     Conjunctiva/sclera: Conjunctivae normal.  Cardiovascular:     Rate and Rhythm: Normal rate.  Pulmonary:     Effort: Pulmonary effort is normal.  Musculoskeletal:        General: Normal range of motion.     Cervical back: Normal range of motion.  Neurological:     Mental Status: She is alert and oriented to person, place, and time.  Psychiatric:        Attention and Perception: Attention and perception normal.        Mood and Affect: Mood is anxious and depressed.        Speech: Speech normal.        Behavior: Behavior is agitated. Behavior is cooperative.        Thought Content: Thought content includes suicidal ideation. Thought content includes suicidal  plan.         Cognition and Memory: Cognition normal.        Judgment: Judgment is impulsive.     Review of Systems  Constitutional: Negative.   HENT: Negative.   Eyes: Negative.   Respiratory: Negative.   Cardiovascular: Negative.   Gastrointestinal: Negative.   Endocrine: Negative.   Genitourinary: Negative.   Musculoskeletal: Negative.   Skin: Negative.   Allergic/Immunologic: Negative.   Neurological: Negative.   Hematological: Negative.   Psychiatric/Behavioral: Negative.     Blood pressure (!) 141/88, pulse 98, temperature 98.2 F (36.8 C), temperature source Oral, resp. rate 18, last menstrual period 09/15/2020, SpO2 100 %.There is no height or weight on file to calculate BMI.  General Appearance: Disheveled  Eye Contact:  Good  Speech:  Clear and Coherent  Volume:  Normal  Mood:  Anxious and Depressed  Affect:  Congruent  Thought Process:  Coherent  Orientation:  Full (Time, Place, and Person)  Thought Content:  Logical  Suicidal Thoughts:  Yes.  with intent/plan  Homicidal Thoughts:  No  Memory:  Immediate;   Good Recent;   Good Remote;   Good  Judgement:  Poor  Insight:  Lacking  Psychomotor Activity:  Normal  Concentration:  Concentration: Good and Attention Span: Good  Recall:  Good  Fund of Knowledge:  Good  Language:  Good  Akathisia:  No  Handed:  Right  AIMS (if indicated):     Assets:  Communication Skills Desire for Improvement Housing Leisure Time Physical Health Resilience Social Support Vocational/Educational  ADL's:  Intact  Cognition:  WNL  Sleep:       Treatment Plan Summary:  [facility] (ACT Team) were notified and will meet patient in the hospital.   Keep all follow up appointments.  Disposition:  Patient does not meet inpatient psychiatric criteria.   This service was provided via telemedicine using a 2-way, interactive audio and video technology.  Names of all persons participating in this telemedicine service and their  role in this encounter. Name: Anamaria Dusenbury Role: patient   Name:  Thomes Lolling  Role: NP  Name:  Role:   Name:  Role:  EDP Dr. Eulis Foster notifed via secure chat.  Revonda Humphrey, NP 09/19/2020 5:27 PM

## 2020-09-19 NOTE — Discharge Instructions (Signed)
For your behavioral health needs you are advised to continue treatment with the [facility] ACT Team:       [facility]      [address]      [place], [place] [address]      [phone]

## 2020-09-24 ENCOUNTER — Emergency Department (HOSPITAL_COMMUNITY)
Admission: EM | Admit: 2020-09-24 | Discharge: 2020-09-24 | Disposition: A | Discharge status: Home / Self Care | Payer: Medicaid Other | Attending: Emergency Medicine | Admitting: Emergency Medicine

## 2020-09-24 ENCOUNTER — Other Ambulatory Visit: Payer: Self-pay

## 2020-09-24 ENCOUNTER — Encounter (HOSPITAL_COMMUNITY): Payer: Self-pay

## 2020-09-24 DIAGNOSIS — Z79899 Other long term (current) drug therapy: Secondary | ICD-10-CM | POA: Insufficient documentation

## 2020-09-24 DIAGNOSIS — B370 Candidal stomatitis: Secondary | ICD-10-CM

## 2020-09-24 DIAGNOSIS — F17210 Nicotine dependence, cigarettes, uncomplicated: Secondary | ICD-10-CM | POA: Insufficient documentation

## 2020-09-24 DIAGNOSIS — E119 Type 2 diabetes mellitus without complications: Secondary | ICD-10-CM | POA: Diagnosis not present

## 2020-09-24 DIAGNOSIS — I10 Essential (primary) hypertension: Secondary | ICD-10-CM | POA: Insufficient documentation

## 2020-09-24 DIAGNOSIS — Z794 Long term (current) use of insulin: Secondary | ICD-10-CM | POA: Diagnosis not present

## 2020-09-24 DIAGNOSIS — B3781 Candidal esophagitis: Secondary | ICD-10-CM

## 2020-09-24 MED ORDER — NYSTATIN 100000 UNIT/ML MT SUSP: 500000.0000 [IU] | Freq: Four times a day (QID) | OROMUCOSAL | 0 refills | Status: DC

## 2020-09-24 NOTE — Discharge Instructions (Addendum)
You were seen in the emergency room today for your mouth pain.  You were found of a yeast infection in your mouth or throat called thrush.  Been prescribed a medication called nystatin which is to swish around mouth and swallow to treat his infection.  Additionally regarding your breast pain and nipple discharge, you have been referred to the breast clinic.  Please call them to schedule follow-up appointment, additional symptoms following to schedule mammogram.  Return to the ER if you develop any new severe symptoms or any worsening of your current symptoms.

## 2020-09-24 NOTE — ED Provider Notes (Signed)
[name] Provider Note   CSN: [id number] Arrival date & time: 09/24/20  1130     History No chief complaint on file.   [name] is a 30 y.o. female with history of cognitive delay and multiple ED visits, who presents today with concern for white coating on her tongue and the roof of her mouth that is painful, is altered her sense of taste, and makes it difficult for her to eat.  She states that this has been occurring for 4 days.  Of note patient is a diabetic, however endorses compliance with her medications.  Patient also reports tenderness to the touch of her right breast with associated clear nipple discharge x4 days intermittently.  She denies any known masses in the area, recent injury to the area, fevers, chills, nausea, or vomiting at home.  Patient states it has been many years since her last mammogram.  I have personally reviewed this patient's medical records.  Patient history of bipolar 1 disorder, obesity, type 2 diabetes, and cognitive delay.  HPI     Past Medical History:  Diagnosis Date  . Anxiety   . Bipolar 1 disorder ([place])   . Cognitive deficits   . Depression   . Diabetes mellitus without complication ([place])   . Hypertension   . Mental disorder   . Mental health disorder   . Obesity     Patient Active Problem List   Diagnosis Date Noted  . Syphilis 07/15/2020  . Malingering 06/05/2020  . Gastroesophageal reflux disease 05/04/2020  . Hyperglycemia due to type 2 diabetes mellitus ([place]) 05/04/2020  . Long term (current) use of insulin ([place]) 05/04/2020  . Migraine without aura 05/04/2020  . Morbid obesity ([place]) 05/04/2020  . 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 2 diabetes mellitus ([place]) 05/04/2020  . Prolapsed internal hemorrhoids 05/04/2020  . Vitamin D deficiency 05/04/2020  . Other symptoms and signs involving cognitive functions and awareness 05/04/2020  . Suicide attempt ([place])   . Anxiety state 03/06/2020  .  Schizophrenia ([place]) 09/13/2019  . Bipolar I disorder, most recent episode depressed ([place]) 06/23/2019  . MDD (major depressive disorder) 10/10/2018  . Schizoaffective disorder, bipolar type ([place]) 09/25/2018  . Affective psychosis, bipolar ([place]) 06/13/2018  . HTN (hypertension) 05/03/2018  . Tobacco use disorder 05/03/2018  . Adjustment disorder with emotional disturbance 01/02/2018  . 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 Moderate bipolar I disorder, most recent episode depressed ([place])   . Psychosis ([place])   . 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 OCD (obsessive compulsive disorder) 10/05/2016  . Major depressive disorder, recurrent episode, mild ([place]) 05/04/2016  . 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 Learning disability 07/18/2015  . 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 Diabetes mellitus ([place]) 07/18/2015  . 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 Hyperlipidemia 07/18/2015  . Severe episode of recurrent major depressive disorder, without psychotic features ([place])   . Suicidal ideation   . Drug overdose   . Cognitive deficits 10/12/2012  . Generalized anxiety disorder 06/28/2012    Past Surgical History:  Procedure Laterality Date  . CESAREAN SECTION    . CESAREAN SECTION N/A 04/25/2013   Procedure: REPEAT CESAREAN SECTION;  Surgeon: [name], MD;  Location: [facility];  Service: Obstetrics;  Laterality: N/A;  . MASS EXCISION N/A 06/03/2012   Procedure: EXCISION MASS;  Surgeon: [name], MD;  Location: [facility];  Service: ENT;  Laterality: N/A;  Excision uvula mass  . TONSILLECTOMY N/A 06/03/2012   Procedure: TONSILLECTOMY;  Surgeon: [name], MD;  Location: [facility];  Service: ENT;  Laterality: N/A;  . TONSILLECTOMY       OB History    Gravida  3   Para  3   Term  3   Preterm  0    AB  0   Living  3     SAB  0   IAB  0   Ectopic  0   Multiple      Live Births  3           Family History  Problem Relation Age of Onset  . Hypertension Mother   . Diabetes Father     Social History   Tobacco Use  . Smoking status: Current Every Day Smoker    Packs/day: 4.00    Years: 11.00    Pack years: 44.00    Types: Cigarettes, Cigars  . Smokeless tobacco: Never Used  . Tobacco comment: Pt declined  Vaping Use  . Vaping Use: Never used  Substance Use Topics  . Alcohol use: Not Currently    Comment: occ: last intake 4 mts ago  . Drug use: Not Currently    Types: "Crack" cocaine, Other-see comments    Comment: Patient reports hx of smoking Crack    Home Medications Prior to Admission medications   Medication Sig Start Date End Date Taking? Authorizing Provider  nystatin (MYCOSTATIN) 100000 UNIT/ML suspension Use as directed 5 mLs (500,000 Units total) in the mouth or throat 4 (four) times daily. Swish around in your mouth and swallow. 09/24/20  Yes Deiona Hooper R, PA-C  amLODipine (NORVASC) 5 MG tablet Take 5 mg by mouth daily.    [provider]  benztropine (COGENTIN) 1 MG tablet Take 1 mg by mouth 2 (two) times daily. 05/01/20   [provider]  cholecalciferol (VITAMIN D) 25 MCG (1000 UNIT) tablet Take 1,000 Units by mouth daily. 04/30/20   [provider]  divalproex (DEPAKOTE ER) 500 MG 24 hr tablet Take 500 mg by mouth in the morning, at noon, and at bedtime.    [provider]  divalproex (DEPAKOTE) 500 MG DR tablet Take 2 tablets (1,000 mg total) by mouth every 12 (twelve) hours. 09/11/20   Money, Lowry Ram, FNP  haloperidol (HALDOL) 10 MG tablet Take 10 mg by mouth at bedtime. 05/09/20   [provider]  haloperidol decanoate (HALDOL DECANOATE) 100 MG/ML injection Inject 150 mg into the muscle every 28 (twenty-eight) days. Inject 150mg  (1.36ml) every 28 days.    [provider]  hydrOXYzine  (ATARAX/VISTARIL) 25 MG tablet Take 1 tablet (25 mg total) by mouth 3 (three) times daily as needed for anxiety. Patient not taking: No sig reported 05/27/20   Derrill Center, NP  hydrOXYzine (ATARAX/VISTARIL) 25 MG tablet Take 25 mg by mouth 3 (three) times daily.    [provider]  insulin detemir (LEVEMIR) 100 UNIT/ML injection Inject 0.4 mLs (40 Units total) into the skin 2 (two) times daily. 05/05/20   Montine Circle, PA-C  lithium carbonate (ESKALITH) 450 MG CR tablet Take 450 mg by mouth daily.    [provider]  omeprazole (PRILOSEC) 20 MG capsule Take 20 mg by mouth daily.    [provider]  ondansetron (ZOFRAN) 4 MG tablet Take 1 tablet (4 mg total) by mouth every 6 (six) hours. 09/07/20  Peri Jefferson, PA-C  prazosin (MINIPRESS) 5 MG capsule Take 5 mg by mouth at bedtime.    [provider]  primidone (MYSOLINE) 50 MG tablet Take 50 mg by mouth 3 (three) times daily.    [provider]  simvastatin (ZOCOR) 20 MG tablet Take 20 mg by mouth daily.    [provider]  vitamin B-12 (CYANOCOBALAMIN) 1000 MCG tablet Take 1,000 mcg by mouth daily. 11/13/19   [provider]    Allergies    Wellbutrin [bupropion], Omnipaque [iohexol], and Penicillin g  Review of Systems   Review of Systems  Constitutional: Negative.   HENT: Positive for mouth sores and sore throat. Negative for congestion, dental problem, drooling, ear discharge, facial swelling, nosebleeds, postnasal drip, rhinorrhea, sinus pain, tinnitus, trouble swallowing and voice change.   Respiratory: Negative.   Cardiovascular: Negative.   Gastrointestinal: Negative.   Genitourinary:       Right breast pain with clear nipple discharge  Musculoskeletal: Negative.   Skin: Negative.   Neurological: Negative.     Physical Exam Updated Vital Signs LMP 09/15/2020   Physical Exam Vitals and nursing note reviewed. Exam conducted with a chaperone present.  HENT:      Head: Normocephalic and atraumatic.     Nose: Nose normal.     Mouth/Throat:     Mouth: Mucous membranes are moist.     Dentition: Abnormal dentition.     Tongue: Lesions present.     Palate: Lesions present.     Pharynx: Oropharynx is clear. Uvula midline. No oropharyngeal exudate or posterior oropharyngeal erythema.     Tonsils: No tonsillar exudate.      Comments: No sign of oropharyngeal abscess, no tonsillar exudate or edema.  No sublingual or submental tenderness to palpation. Eyes:     General: Lids are normal. Vision grossly intact.        Right eye: No discharge.        Left eye: No discharge.     Extraocular Movements: Extraocular movements intact.     Conjunctiva/sclera: Conjunctivae normal.     Pupils: Pupils are equal, round, and reactive to light.  Neck:     Trachea: Trachea and phonation normal.  Cardiovascular:     Rate and Rhythm: Normal rate and regular rhythm.     Pulses: Normal pulses.     Heart sounds: Normal heart sounds. No murmur heard.   Pulmonary:     Effort: Pulmonary effort is normal. No respiratory distress.     Breath sounds: Normal breath sounds. No wheezing or rales.  Chest:     Chest wall: Tenderness present. No mass, lacerations, deformity, swelling or crepitus.  Breasts:     Tanner Score is 5.     Right: Mass, skin change and tenderness present. No swelling, inverted nipple, nipple discharge, axillary adenopathy or supraclavicular adenopathy.     Left: Skin change present. No swelling, inverted nipple, mass, nipple discharge, tenderness, axillary adenopathy or supraclavicular adenopathy.        Comments: Breasts are pendulous, large bilaterally and symmetric.  While there are no palpable masses in breasts bilaterally, there is thickening and puckering of the skin surrounding the nipples bilaterally, R>L.  Without other signs of infection including crepitus, induration, or drainage.  Abdominal:     General: Bowel sounds are normal. There  is no distension.     Palpations: Abdomen is soft.     Tenderness: There is no abdominal tenderness. There is no right CVA tenderness or  left CVA tenderness.  Musculoskeletal:        General: No deformity.     Cervical back: Normal range of motion and neck supple.     Right lower leg: No edema.     Left lower leg: No edema.  Lymphadenopathy:     Cervical: No cervical adenopathy.     Upper Body:     Right upper body: No supraclavicular or axillary adenopathy.     Left upper body: No supraclavicular or axillary adenopathy.  Skin:    General: Skin is warm and dry.     Capillary Refill: Capillary refill takes less than 2 seconds.  Neurological:     Mental Status: She is alert. Mental status is at baseline.  Psychiatric:        Mood and Affect: Mood normal. Affect is labile.        Behavior: Behavior is agitated. Behavior is cooperative.     ED Results / Procedures / Treatments   Labs (all labs ordered are listed, but only abnormal results are displayed) Labs Reviewed - No data to display  EKG None  Radiology No results found.  Procedures Procedures   Medications Ordered in ED Medications - No data to display  ED Course  I have reviewed the triage vital signs and the nursing notes.  Pertinent labs & imaging results that were available during my care of the patient were reviewed by me and considered in my medical decision making (see chart for details).    MDM Rules/Calculators/A&P                         30 year old female with cognitive delay who presents with concern for 4 days of white coating in her mouth that causes pain, as well as right breast pain and nipple drainage.  Differential diagnosis for oral symptoms include but are not limited to thrush, perioral leukoplakia, angioedema, aphthous stomatitis, gingiva stomatitis, angular cheilitis.  Patient without recent antibiotic or steroid use, does have history of diabetes.  In regards to patient's breast symptoms  primary concern is malignancy given nipple drainage, however pituitary anomaly also considered, prolactin derangement on the differential as well possible cellulitis of the breast or abscess.  Patient normotensive, afebrile, and satting 100% oxygen on room air at time of intake.  Cardiopulmonary exam is normal, abdominal exam is benign.  Patient does have pseudomembranous thick white coating on the tongue and identical lesions on the palate concerning for oropharyngeal candidiasis, in context of diabetes.  No sign of sublingual or submental tenderness palpation to suggest angioedema/Ludwig's angina.  Breast exam performed in the presence of chaperone, without palpable mass in breasts bilaterally.  There is poorly defined area of mild erythema medial to the right nipple with associated tenderness palpation without any induration, crepitus, warmth to the touch, or fluctuance.  No nipple discharge visualized at time of this exam.  Given reassuring physical exam and vital signs, no further work-up is warranted in ED at this time.  Will treat with oral nystatin swish and swallow for apparent thrush, recommend PCP follow-up.  Regarding abnormal breast exam and symptoms, recommend close follow-up with the breast center.  Ambulatory referral has been placed; patient is aware that she will need mammography and follow-up with the breast center for further evaluation of her breast symptoms.  [name] voiced understanding of her medical evaluation and treatment plan.  Each of her questions was answered to her expressed satisfaction.  Return precautions were given.  Patient is stable and appropriate for discharge at this time.  This chart was dictated using voice recognition software, Dragon. Despite the best efforts of this provider to proofread and correct errors, errors may still occur which can change documentation meaning.   Final Clinical Impression(s) / ED Diagnoses Final diagnoses:  Thrush of mouth and  esophagus ([place])    Rx / DC Orders ED Discharge Orders         Ordered    nystatin (MYCOSTATIN) 100000 UNIT/ML suspension  4 times daily        09/24/20 1429    Ambulatory referral to Breast Clinic       Comments: Right nipple discharge and breast pain, years since last mammogram   09/24/20 [place], Sharlene Dory 09/24/20 2048    Lacretia Leigh, MD 09/27/20 [phone]

## 2020-09-24 NOTE — ED Triage Notes (Signed)
Per EMS, pt complains of white coating on tongue. She reports sores in mouth x 4 days. Also reports breast has been tender to touch x 4 days. Hx diabetes, CBG 209. No recent abx use.

## 2020-10-03 ENCOUNTER — Encounter (HOSPITAL_COMMUNITY): Payer: Self-pay | Admitting: Emergency Medicine

## 2020-10-03 ENCOUNTER — Emergency Department (HOSPITAL_COMMUNITY)
Admission: EM | Admit: 2020-10-03 | Discharge: 2020-10-04 | Disposition: A | Discharge status: Home / Self Care | Payer: Medicaid Other | Attending: Emergency Medicine | Admitting: Emergency Medicine

## 2020-10-03 DIAGNOSIS — R4584 Anhedonia: Secondary | ICD-10-CM | POA: Diagnosis not present

## 2020-10-03 DIAGNOSIS — Z79899 Other long term (current) drug therapy: Secondary | ICD-10-CM | POA: Insufficient documentation

## 2020-10-03 DIAGNOSIS — R45851 Suicidal ideations: Secondary | ICD-10-CM | POA: Diagnosis not present

## 2020-10-03 DIAGNOSIS — F332 Major depressive disorder, recurrent severe without psychotic features: Secondary | ICD-10-CM | POA: Insufficient documentation

## 2020-10-03 DIAGNOSIS — Z20822 Contact with and (suspected) exposure to covid-19: Secondary | ICD-10-CM | POA: Diagnosis not present

## 2020-10-03 DIAGNOSIS — E1142 Type 2 diabetes mellitus with diabetic polyneuropathy: Secondary | ICD-10-CM | POA: Diagnosis not present

## 2020-10-03 DIAGNOSIS — I10 Essential (primary) hypertension: Secondary | ICD-10-CM | POA: Diagnosis not present

## 2020-10-03 DIAGNOSIS — Z794 Long term (current) use of insulin: Secondary | ICD-10-CM | POA: Diagnosis not present

## 2020-10-03 DIAGNOSIS — F17210 Nicotine dependence, cigarettes, uncomplicated: Secondary | ICD-10-CM | POA: Diagnosis not present

## 2020-10-03 DIAGNOSIS — F250 Schizoaffective disorder, bipolar type: Secondary | ICD-10-CM | POA: Diagnosis not present

## 2020-10-03 DIAGNOSIS — F32A Depression, unspecified: Secondary | ICD-10-CM

## 2020-10-03 LAB — SALICYLATE LEVEL: Salicylate Lvl: <7 mg/dL — ABNORMAL LOW (ref 7.0–30.0)

## 2020-10-03 LAB — COMPREHENSIVE METABOLIC PANEL
ALT: 14 U/L (ref 0–44)
AST: 22 U/L (ref 15–41)
Albumin: 3.6 g/dL (ref 3.5–5.0)
Alkaline Phosphatase: 49 U/L (ref 38–126)
Anion gap: 11 (ref 5–15)
BUN: 7 mg/dL (ref 6–20)
CO2: 26 mmol/L (ref 22–32)
Calcium: 9.1 mg/dL (ref 8.9–10.3)
Chloride: 103 mmol/L (ref 98–111)
Creatinine, Ser: 0.91 mg/dL (ref 0.44–1.00)
GFR, Estimated: >60 mL/min (ref >60)
Glucose, Bld: 274 mg/dL — ABNORMAL HIGH (ref 70–99)
Potassium: 4 mmol/L (ref 3.5–5.1)
Sodium: 140 mmol/L (ref 135–145)
Total Bilirubin: 0.6 mg/dL (ref 0.3–1.2)
Total Protein: 6.6 g/dL (ref 6.5–8.1)

## 2020-10-03 LAB — CBC WITH DIFFERENTIAL/PLATELET
Abs Immature Granulocytes: 0.02 10*3/uL (ref 0.00–0.07)
Basophils Absolute: 0.1 10*3/uL (ref 0.0–0.1)
Basophils Relative: 1 %
Eosinophils Absolute: 0.5 10*3/uL (ref 0.0–0.5)
Eosinophils Relative: 5 %
HCT: 36.1 % (ref 36.0–46.0)
Hemoglobin: 11.4 g/dL — ABNORMAL LOW (ref 12.0–15.0)
Immature Granulocytes: 0 %
Lymphocytes Relative: 34 %
Lymphs Abs: 3.4 10*3/uL (ref 0.7–4.0)
MCH: 24.2 pg — ABNORMAL LOW (ref 26.0–34.0)
MCHC: 31.6 g/dL (ref 30.0–36.0)
MCV: 76.6 fL — ABNORMAL LOW (ref 80.0–100.0)
Monocytes Absolute: 0.5 10*3/uL (ref 0.1–1.0)
Monocytes Relative: 5 %
Neutro Abs: 5.5 10*3/uL (ref 1.7–7.7)
Neutrophils Relative %: 55 %
Platelets: 238 10*3/uL (ref 150–400)
RBC: 4.71 MIL/uL (ref 3.87–5.11)
RDW: 16.7 % — ABNORMAL HIGH (ref 11.5–15.5)
WBC: 10 10*3/uL (ref 4.0–10.5)
nRBC: 0 % (ref 0.0–0.2)

## 2020-10-03 LAB — SARS CORONAVIRUS 2 (TAT 6-24 HRS): SARS Coronavirus 2: NEGATIVE

## 2020-10-03 LAB — I-STAT BETA HCG BLOOD, ED (MC, WL, AP ONLY): I-stat hCG, quantitative: <5 m[IU]/mL (ref <5)

## 2020-10-03 LAB — CBG MONITORING, ED: Glucose-Capillary: 177 mg/dL — ABNORMAL HIGH (ref 70–99)

## 2020-10-03 LAB — RAPID URINE DRUG SCREEN, HOSP PERFORMED
Amphetamines: NOT DETECTED
Barbiturates: POSITIVE — AB
Benzodiazepines: NOT DETECTED
Cocaine: NOT DETECTED
Opiates: NOT DETECTED
Tetrahydrocannabinol: POSITIVE — AB

## 2020-10-03 LAB — ACETAMINOPHEN LEVEL: Acetaminophen (Tylenol), Serum: <10 ug/mL — ABNORMAL LOW (ref 10–30)

## 2020-10-03 LAB — ETHANOL: Alcohol, Ethyl (B): <10 mg/dL (ref <10)

## 2020-10-03 MED ORDER — AMLODIPINE BESYLATE 5 MG PO TABS
5.0000 mg | ORAL_TABLET | Freq: Every day | ORAL | Status: DC
  Administered 2020-10-04: 5 mg via ORAL
  Filled 2020-10-03: qty 1

## 2020-10-03 MED ORDER — BENZTROPINE MESYLATE 1 MG PO TABS
1.0000 mg | ORAL_TABLET | Freq: Two times a day (BID) | ORAL | Status: DC
  Administered 2020-10-04 (×2): 1 mg via ORAL
  Filled 2020-10-03 (×2): qty 1

## 2020-10-03 MED ORDER — LITHIUM CARBONATE ER 450 MG PO TBCR
450.0000 mg | EXTENDED_RELEASE_TABLET | Freq: Every day | ORAL | Status: DC
  Administered 2020-10-04: 450 mg via ORAL
  Filled 2020-10-03: qty 1

## 2020-10-03 MED ORDER — HALOPERIDOL 5 MG PO TABS
5.0000 mg | ORAL_TABLET | Freq: Once | ORAL | Status: AC
  Administered 2020-10-03: 5 mg via ORAL
  Filled 2020-10-03: qty 1

## 2020-10-03 MED ORDER — PANTOPRAZOLE SODIUM 40 MG PO TBEC
40.0000 mg | DELAYED_RELEASE_TABLET | Freq: Every day | ORAL | Status: DC
  Administered 2020-10-04: 40 mg via ORAL
  Filled 2020-10-03: qty 1

## 2020-10-03 MED ORDER — HYDROXYZINE HCL 50 MG PO TABS
50.0000 mg | ORAL_TABLET | Freq: Three times a day (TID) | ORAL | Status: DC
  Administered 2020-10-04 (×2): 50 mg via ORAL
  Filled 2020-10-03 (×2): qty 1

## 2020-10-03 MED ORDER — PRAZOSIN HCL 2 MG PO CAPS
5.0000 mg | ORAL_CAPSULE | Freq: Every day | ORAL | Status: DC
  Administered 2020-10-04: 5 mg via ORAL
  Filled 2020-10-03: qty 1

## 2020-10-03 MED ORDER — HALOPERIDOL 5 MG PO TABS
10.0000 mg | ORAL_TABLET | Freq: Every day | ORAL | Status: DC
  Administered 2020-10-04: 10 mg via ORAL
  Filled 2020-10-03: qty 2

## 2020-10-03 MED ORDER — INSULIN DETEMIR 100 UNIT/ML ~~LOC~~ SOLN
40.0000 [IU] | Freq: Two times a day (BID) | SUBCUTANEOUS | Status: DC
  Administered 2020-10-04 (×2): 40 [IU] via SUBCUTANEOUS
  Filled 2020-10-03 (×3): qty 0.4

## 2020-10-03 MED ORDER — PRIMIDONE 50 MG PO TABS
50.0000 mg | ORAL_TABLET | Freq: Three times a day (TID) | ORAL | Status: DC
  Administered 2020-10-04 (×2): 50 mg via ORAL
  Filled 2020-10-03 (×2): qty 1

## 2020-10-03 NOTE — ED Provider Notes (Signed)
[facility] EMERGENCY DEPARTMENT Provider Note   CSN: [id number] Arrival date & time: 10/03/20  1351     History Chief Complaint  Patient presents with   Suicidal    [name] is a 30 y.o. female.  The history is provided by the patient and medical records.  Mental Health Problem Presenting symptoms: agitation, depression, hallucinations, suicidal thoughts, suicidal threats and suicide attempt (2 weeks ago (OD on medication))   Presenting symptoms: no homicidal ideas, no paranoid behavior and no self-mutilation   Degree of incapacity (severity):  Moderate Onset quality:  Gradual Duration:  5 days Timing:  Constant Progression:  Worsening Chronicity:  Recurrent Context: drug abuse and stressful life event (arguments with family members)   Treatment compliance:  Most of the time Time since last psychoactive medication taken:  12 hours Relieved by:  None tried Worsened by:  Family interactions Ineffective treatments:  None tried Associated symptoms: anhedonia, anxiety, feelings of worthlessness, irritability and poor judgment   Associated symptoms: no abdominal pain, no appetite change, no chest pain, no decreased need for sleep, not distractible, no euphoric mood, no fatigue, no headaches, no hypersomnia, no hyperventilation and no insomnia   Risk factors: hx of mental illness       Past Medical History:  Diagnosis Date   Anxiety    Bipolar 1 disorder ([place])    Cognitive deficits    Depression    Diabetes mellitus without complication ([place])    Hypertension    Mental disorder    Mental health disorder    Obesity     Patient Active Problem List   Diagnosis Date Noted   Syphilis 07/15/2020   Malingering 06/05/2020   Gastroesophageal reflux disease 05/04/2020   Hyperglycemia due to type 2 diabetes mellitus ([place]) 05/04/2020   Long term (current) use of insulin ([place]) 05/04/2020   Migraine without aura 05/04/2020   Morbid obesity ([place])  05/04/2020   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 2 diabetes mellitus ([place]) 05/04/2020   Prolapsed internal hemorrhoids 05/04/2020   Vitamin D deficiency 05/04/2020   Other symptoms and signs involving cognitive functions and awareness 05/04/2020   Suicide attempt ([place])    Anxiety state 03/06/2020   Schizophrenia ([place]) 09/13/2019   Bipolar I disorder, most recent episode depressed ([place]) 06/23/2019   MDD (major depressive disorder) 10/10/2018   Schizoaffective disorder, bipolar type ([place]) 09/25/2018   Affective psychosis, bipolar ([place]) 06/13/2018   HTN (hypertension) 05/03/2018   Tobacco use disorder 05/03/2018   Adjustment disorder with emotional disturbance 01/02/2018   Schizophrenia, 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   Moderate bipolar I disorder, most recent episode depressed ([place])    Psychosis ([place])    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/2017   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   OCD (obsessive compulsive disorder) 10/05/2016   Major depressive disorder, recurrent episode, mild ([place]) 05/04/2016   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   Learning disability 07/18/2015   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/2015   Diabetes mellitus ([place]) 07/18/2015   MDD (major depressive disorder), recurrent, severe, 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   Hyperlipidemia 07/18/2015   Severe episode of recurrent major depressive disorder, without psychotic features ([place])    Suicidal ideation    Drug overdose    Cognitive deficits 10/12/2012   Generalized anxiety disorder 06/28/2012    Past Surgical History:  Procedure Laterality Date   CESAREAN SECTION     CESAREAN SECTION N/A 04/25/2013   Procedure: REPEAT CESAREAN SECTION;  Surgeon: [name], MD;  Location:  [facility];  Service: Obstetrics;  Laterality: N/A;   MASS EXCISION N/A 06/03/2012   Procedure: EXCISION MASS;  Surgeon: [name], MD;  Location: [facility];  Service: ENT;  Laterality: N/A;  Excision  uvula mass   TONSILLECTOMY N/A 06/03/2012   Procedure: TONSILLECTOMY;  Surgeon: [name], MD;  Location: [facility];  Service: ENT;  Laterality: N/A;   TONSILLECTOMY       OB History     Gravida  3   Para  3   Term  3   Preterm  0   AB  0   Living  3      SAB  0   IAB  0   Ectopic  0   Multiple      Live Births  3           Family History  Problem Relation Age of Onset   Hypertension Mother    Diabetes Father     Social History   Tobacco Use   Smoking status: Every Day    Packs/day: 4.00    Years: 11.00    Pack years: 44.00    Types: Cigarettes, Cigars   Smokeless tobacco: Never   Tobacco comments:    Pt declined  Vaping Use   Vaping Use: Never used  Substance Use Topics   Alcohol use: Not Currently    Comment: occ: last intake 4 mts ago   Drug use: Not Currently    Types: "Crack" cocaine, Other-see comments    Comment: Patient reports hx of smoking Crack    Home Medications Prior to Admission medications   Medication Sig Start Date End Date Taking? Authorizing Provider  amLODipine (NORVASC) 5 MG tablet Take 5 mg by mouth daily.   Yes [provider]  benztropine (COGENTIN) 1 MG tablet Take 1 mg by mouth 2 (two) times daily. 05/01/20  Yes [provider]  cholecalciferol (VITAMIN D) 25 MCG (1000 UNIT) tablet Take 1,000 Units by mouth daily. 04/30/20  Yes [provider]  haloperidol (HALDOL) 10 MG tablet Take 10 mg by mouth at bedtime. 05/09/20  Yes [provider]  haloperidol decanoate (HALDOL DECANOATE) 100 MG/ML injection Inject 150 mg into the muscle every 28 (twenty-eight) days. Inject 150mg  (1.48ml) every 28 days.   Yes [provider]  hydrOXYzine (VISTARIL) 50 MG capsule Take 50 mg by mouth 3 (three) times daily.   Yes [provider]  insulin detemir (LEVEMIR) 100 UNIT/ML injection Inject 0.4 mLs (40 Units total) into the skin 2 (two) times daily. 05/05/20  Yes  Montine Circle, PA-C  lithium carbonate (ESKALITH) 450 MG CR tablet Take 450 mg by mouth daily.   Yes [provider]  omeprazole (PRILOSEC) 20 MG capsule Take 20 mg by mouth daily.   Yes [provider]  ondansetron (ZOFRAN) 4 MG tablet Take 1 tablet (4 mg total) by mouth every 6 (six) hours. 09/07/20  Yes Peri Jefferson, PA-C  prazosin (MINIPRESS) 5 MG capsule Take 5 mg by mouth at bedtime.   Yes [provider]  primidone (MYSOLINE) 50 MG tablet Take 50 mg by mouth 3 (three) times daily.   Yes [provider]  simvastatin (ZOCOR) 20 MG tablet Take 20 mg by mouth daily.   Yes [provider]  vitamin B-12 (CYANOCOBALAMIN) 1000 MCG tablet Take 1,000 mcg by mouth daily. 11/13/19  Yes [provider]  divalproex (DEPAKOTE) 500 MG DR tablet Take 2 tablets (1,000 mg  total) by mouth every 12 (twelve) hours. Patient not taking: No sig reported 09/11/20   Money, Lowry Ram, FNP  hydrOXYzine (ATARAX/VISTARIL) 25 MG tablet Take 1 tablet (25 mg total) by mouth 3 (three) times daily as needed for anxiety. Patient not taking: Reported on 10/03/2020 05/27/20   Derrill Center, NP  nystatin (MYCOSTATIN) 100000 UNIT/ML suspension Use as directed 5 mLs (500,000 Units total) in the mouth or throat 4 (four) times daily. Swish around in your mouth and swallow. Patient not taking: No sig reported 09/24/20   Sponseller, Eugene Garnet R, PA-C    Allergies    Wellbutrin [bupropion], Omnipaque [iohexol], Contrast media [iodinated diagnostic agents], Depakote [divalproex sodium], and Penicillin g  Review of Systems   Review of Systems  Constitutional:  Positive for irritability. Negative for appetite change, chills, fatigue and fever.  HENT:  Negative for ear pain and sore throat.   Eyes:  Negative for pain and visual disturbance.  Respiratory:  Negative for cough and shortness of breath.   Cardiovascular:  Negative for chest pain and palpitations.  Gastrointestinal:   Negative for abdominal pain and vomiting.  Genitourinary:  Negative for dysuria and hematuria.  Musculoskeletal:  Negative for arthralgias and back pain.  Skin:  Negative for color change and rash.  Neurological:  Negative for seizures, syncope and headaches.  Psychiatric/Behavioral:  Positive for agitation, hallucinations and suicidal ideas. Negative for homicidal ideas, paranoia and self-injury. The patient is nervous/anxious. The patient does not have insomnia.   All other systems reviewed and are negative.  Physical Exam Updated Vital Signs BP 117/65 (BP Location: Left Arm)   Pulse 78   Temp 98.1 F (36.7 C)   Resp 20   LMP 09/15/2020   SpO2 99%   Physical Exam Vitals and nursing note reviewed.  Constitutional:      General: She is in acute distress (emotional distress).     Appearance: She is well-developed. She is obese. She is not ill-appearing or toxic-appearing.  HENT:     Head: Normocephalic and atraumatic.  Eyes:     Conjunctiva/sclera: Conjunctivae normal.  Cardiovascular:     Rate and Rhythm: Normal rate and regular rhythm.     Heart sounds: No murmur heard. Pulmonary:     Effort: Pulmonary effort is normal. No respiratory distress.     Breath sounds: Normal breath sounds.  Abdominal:     Palpations: Abdomen is soft.     Tenderness: There is no abdominal tenderness.  Musculoskeletal:     Cervical back: Neck supple.  Skin:    General: Skin is warm and dry.  Neurological:     Mental Status: She is alert.  Psychiatric:        Mood and Affect: Affect is labile and tearful.        Behavior: Behavior is agitated.        Thought Content: Thought content includes suicidal ideation. Thought content includes suicidal (overdose) plan.     Comments:  Actively crying and distraught over current relationships with her family members. Saying that no one cares for her or about her. Says that she "just need to have somewhere to go"    ED Results / Procedures /  Treatments   Labs (all labs ordered are listed, but only abnormal results are displayed) Labs Reviewed  CBC WITH DIFFERENTIAL/PLATELET - Abnormal; Notable for the following components:      Result Value   Hemoglobin 11.4 (*)    MCV 76.6 (*)    [facility]  24.2 (*)    RDW 16.7 (*)    All other components within normal limits  COMPREHENSIVE METABOLIC PANEL - Abnormal; Notable for the following components:   Glucose, Bld 274 (*)    All other components within normal limits  ACETAMINOPHEN LEVEL - Abnormal; Notable for the following components:   Acetaminophen (Tylenol), Serum <10 (*)    All other components within normal limits  SALICYLATE LEVEL - Abnormal; Notable for the following components:   Salicylate Lvl <1.2 (*)    All other components within normal limits  RAPID URINE DRUG SCREEN, HOSP PERFORMED - Abnormal; Notable for the following components:   Tetrahydrocannabinol POSITIVE (*)    Barbiturates POSITIVE (*)    All other components within normal limits  CBG MONITORING, ED - Abnormal; Notable for the following components:   Glucose-Capillary 177 (*)    All other components within normal limits  SARS CORONAVIRUS 2 (TAT 6-24 HRS)  ETHANOL  LITHIUM LEVEL  I-STAT BETA HCG BLOOD, ED ([facility], [facility], [facility] ONLY)  CBG MONITORING, ED    EKG None  Radiology No results found.  Procedures Procedures   Medications Ordered in ED Medications  amLODipine (NORVASC) tablet 5 mg (has no administration in time range)  benztropine (COGENTIN) tablet 1 mg (1 mg Oral Given 10/04/20 0010)  haloperidol (HALDOL) tablet 10 mg (10 mg Oral Given 10/04/20 0010)  hydrOXYzine (ATARAX/VISTARIL) tablet 50 mg (50 mg Oral Given 10/04/20 0011)  insulin detemir (LEVEMIR) injection 40 Units (40 Units Subcutaneous Given 10/04/20 0011)  lithium carbonate (ESKALITH) CR tablet 450 mg (has no administration in time range)  pantoprazole (PROTONIX) EC tablet 40 mg (has no administration in time range)  prazosin (MINIPRESS)  capsule 5 mg (5 mg Oral Given 10/04/20 0011)  primidone (MYSOLINE) tablet 50 mg (50 mg Oral Given 10/04/20 0011)  haloperidol (HALDOL) tablet 5 mg (5 mg Oral Given 10/03/20 1605)    ED Course  I have reviewed the triage vital signs and the nursing notes.  Pertinent labs & imaging results that were available during my care of the patient were reviewed by me and considered in my medical decision making (see chart for details).  Clinical Course as of 10/04/20 0021  Thu Oct 03, 2020  1606 Patient reports taking lithium and thinks she may have overdosed on this medication TWO weeks ago but denies today. I ordered a lithium level. [ZB]    Clinical Course User Index [ZB] Pearson Grippe, DO   MDM Rules/Calculators/A&P                          This is a 30 year old female with a reported history of schizophrenia, depression, anxiety and bipolar disorder.  Presented to the emergency department with suicidal ideation worsening over the last 3 days. Says that she attempted suicide 2 weeks ago via medication overdose however she called EMS. She endorses active suicidal ideation with a plan to overdose.  She wants placement in a psychiatric facility.  At time of my exam she is in acute emotional distress, actively crying and upset about current relationship with her family members. I ordered for a p.o. dose of Haldol for emotional distress and agitation.  Is my clinical impression that the patient could potentially represent a threat to her own life and requires further evaluation by psychiatry.  Psychiatry was consulted. I ordered a med rec.  She has diabetes and takes insulin twice daily.  Ordered for glucose checks.  Signout to oncoming  team pending psychiatry evaluation, recommendations. Med reconciliation was performed and I ordered her home medications.  Final Clinical Impression(s) / ED Diagnoses Final diagnoses:  None    Rx / DC Orders ED Discharge Orders     None         Pearson Grippe, DO 10/04/20 0022    Tegeler, Gwenyth Allegra, MD 10/04/20 1153

## 2020-10-03 NOTE — ED Notes (Signed)
Dinner tray provided

## 2020-10-03 NOTE — ED Notes (Signed)
Patient is currently being TTS.

## 2020-10-03 NOTE — ED Notes (Signed)
Increased anxiety observed on pt when MD tried to talk to her. Pt started crying in a loud voice stating, "I need help." Pt is redirectable at this time. Haldol PO PRN given. Pt sitting on the chair at this time eating graham crackers. Safety precautions maintained. Will continue to monitor.

## 2020-10-03 NOTE — ED Notes (Addendum)
Pt reports hx of schizophrenia, depression, anxiety, and bipolar DO. Reports SI with plan to OD on her home meds since this AM. Reports arguing with boyfriend this AM that triggered stress. Reports worsening anxiety and explosive mood swings. Lives alone and in the process of going to a group home. Started taking Depakote last week. Alert and oriented x 4. Safety precautions maintained. Pt changed into a gown and belongings taken away. Placed in 2 belonging bags in locker 5.

## 2020-10-03 NOTE — ED Notes (Signed)
Patient resting. Even Resp. No distress noted at this time. Will continue to monitor.

## 2020-10-03 NOTE — BH Assessment (Signed)
TTS contacted [facility] to set up telepsych. Patient currently in hall bed. RN states she is looking for a room to place patient in for assessment and will contact counselor when ready.

## 2020-10-03 NOTE — BHH Counselor (Addendum)
TTS contacted [name] crisis line at [phone]. Representative, Ulice Dash, states an [facility] representative will be calling me shortly.  @[phone]  Ronalee Belts from [employer] returned phone call. He is familiar with patient and understands this frequent presentation. He states they do not have anyone available to come speak with her or pick her up. He states "I recommend she stay there over night and let her calm down. If yall let her go now I can't promise she won't go home and overdose or come right back in."   This counselor discussed with Darrol Angel, NP who recommends patient be observed overnight for safety and stabilization.

## 2020-10-03 NOTE — BH Assessment (Addendum)
Comprehensive Clinical Assessment (CCA) Note  10/03/2020 [name] [id number]  Disposition: Stacy Angel, NP recommends patient be observed overnight in the ED for safety and stabilization with a re-evaluation by psychiatry in the AM with probable discharge. Stacy Duos, RN notified of disposition.  The patient demonstrates the following risk factors for suicide: Chronic risk factors for suicide include: psychiatric disorder of schizoaffective disorder, previous suicide attempts by overdose, and previous self-harm   . Acute risk factors for suicide include: family or marital conflict, unemployment, and social withdrawal/isolation. Protective factors for this patient include: positive therapeutic relationship. Considering these factors, the overall suicide risk at this point appears to be high. Patient is not appropriate for outpatient follow up.   [name] ED from 10/03/2020 in [place] ED from 09/24/2020 in [place] [facility] ED from 09/18/2020 in [place] [facility]  C-SSRS RISK CATEGORY High Risk Error: Question 2 not populated High Risk       Patient is a 30 year old female presenting voluntarily to [name] ED via [employer] reporting suicidal ideation with a plan to overdose. She is emotionally labile during assessment. Patient is well known to ED and [facility] service line due to numerous encounters for chronic mental illness and housing concerns. She has accessed ED/[facility] more than 20 times in 2022 alone. She states "I need some help. I'm fighting with my boyfriend everyday. My medicine isn't working. I overdosed 2 weeks ago. I was going to do it again so I called for help." Patient additionally reports frustration with [name] and the fact that they have not yet placed her in a group home. Patient also tells me her relationship with her mother and sister is strained, contributing to her distress. She states  today when she was in crisis she contacted her [facility] crisis line but no one answered so she called the police to be transported to the ED. She endorses current SI with a plan to overdose. She endorses HI toward her boyfriend, but does not have a specific plan. Patient denies current AVH or substance use.  Chief Complaint:  Chief Complaint  Patient presents with   Suicidal   Visit Diagnosis: Schizoaffective disorder, bipolar type   CCA Screening, Triage and Referral (STR)  Patient Reported Information How did you hear about [place]? Self  Referral name: Self  Referral phone number: 0 (N/A)   Whom do you see for routine medical problems? Primary Care  Practice/Facility Name: [facility]  Practice/Facility Phone Number: [phone]  Name of Contact: Raelyn Number  Contact Number: [phone]  Contact Fax Number: [phone]  Prescriber Name: Raelyn Number  Prescriber Address (if known): [address]; [place], [place] [address]   What Is the Reason for Your Visit/Call Today? "I need help. I'm going to kill myself."  How Long Has This Been Causing You Problems? > than 6 months  What Do You Feel Would Help You the Most Today? Medication(s); Housing Assistance; Treatment for Depression or other mood problem   Have You Recently Been in Any Inpatient Treatment (Hospital/Detox/Crisis Center/28-Day Program)? No  Name/Location of Program/Hospital:[facility]  How Long Were You There? 1  When Were You Discharged? 09/14/20   Have You Ever Received Services From [employer] Before? Yes  Who Do You See at [facility]? [facility] inpatient treatment, [place] services, various providers in the ED   Have You Recently Had Any Thoughts About Hurting Yourself? Yes  Are You Planning to Commit  Suicide/Harm Yourself At This time? Yes   Have you Recently Had Thoughts About Hurting Someone [name]? Yes  Explanation: wants to hurt her BF, no plan   Have You Used Any Alcohol or Drugs in the  Past 24 Hours? No  How Long Ago Did You Use Drugs or Alcohol? No data recorded What Did You Use and How Much? No data recorded  Do You Currently Have a Therapist/Psychiatrist? Yes  Name of Therapist/Psychiatrist: [name]   Have You Been Recently Discharged From Any Office Practice or Programs? No  Explanation of Discharge From Practice/Program: No data recorded    CCA Screening Triage Referral Assessment Type of Contact: Tele-Assessment  Is this Initial or Reassessment? Initial Assessment  Date Telepsych consult ordered in [facility]:  10/03/20  Time Telepsych consult ordered in [facility]:  1729   Patient Reported Information Reviewed? Yes  Patient Left Without Being Seen? No data recorded Reason for Not Completing Assessment: No data recorded  Collateral Involvement: [facility]   Does Patient Have a [place]? No data recorded Name and Contact of Legal Guardian: No data recorded If Minor and Not Living with Parent(s), Who has Custody? N/A  Is CPS involved or ever been involved? Never  Is APS involved or ever been involved? Never   Patient Determined To Be At Risk for Harm To Self or Others Based on Review of Patient Reported Information or Presenting Complaint? Yes, for Self-Harm  Method: No data recorded Availability of Means: No data recorded Intent: No data recorded Notification Required: No data recorded Additional Information for Danger to Others Potential: No data recorded Additional Comments for Danger to Others Potential: No data recorded Are There Guns or Other Weapons in Your Home? No data recorded Types of Guns/Weapons: No data recorded Are These Weapons Safely Secured?                            No data recorded Who Could Verify You Are Able To Have These Secured: No data recorded Do You Have any Outstanding Charges, Pending Court Dates, Parole/Probation? No data recorded Contacted To Inform of Risk of Harm To Self or Others:  Other: Comment ([facility])   [facility] of Assessment: [name]   Does Patient Present under Involuntary Commitment? No  IVC Papers Initial File Date: No data recorded  [place] of Residence: [place]   Patient Currently Receiving the Following Services: [facility])   Determination of Need: Emergent (2 hours)   Options For Referral: Inpatient Hospitalization; Medication Management; [facility]     CCA Biopsychosocial Intake/Chief Complaint:  NA  Current Symptoms/Problems: NA   Patient Reported Schizophrenia/Schizoaffective Diagnosis in Past: Yes   Strengths: NA  Preferences: NA  Abilities: NA   Type of Services Patient Feels are Needed: NA   Initial Clinical Notes/Concerns: N/A   Mental Health Symptoms Depression:   Change in energy/activity; Hopelessness; Increase/decrease in appetite; Irritability; Sleep (too much or little); Weight gain/loss; Worthlessness; Fatigue; Tearfulness   Duration of Depressive symptoms:  Greater than two weeks   Mania:   Recklessness; Racing thoughts; Irritability; Change in energy/activity   Anxiety:    Difficulty concentrating   Psychosis:   Delusions   Duration of Psychotic symptoms:  Greater than six months   Trauma:   None   Obsessions:   None   Compulsions:   None   Inattention:   None   Hyperactivity/Impulsivity:   N/A  Oppositional/Defiant Behaviors:   None   Emotional Irregularity:   Recurrent suicidal behaviors/gestures/threats; Mood lability; Unstable self-image; Potentially harmful impulsivity   Other Mood/Personality Symptoms:   None noted    Mental Status Exam Appearance and self-care  Stature:   Average   Weight:   Obese   Clothing:   Casual   Grooming:   Normal   Cosmetic use:   None   Posture/gait:   Tense   Motor activity:   Not Remarkable   Sensorium  Attention:   Normal   Concentration:   Normal   Orientation:   X5    Recall/memory:   Normal   Affect and Mood  Affect:   Depressed   Mood:   Angry   Relating  Eye contact:   Normal   Facial expression:   Responsive; Angry   Attitude toward examiner:   Cooperative; Dramatic; Manipulative   Thought and Language  Speech flow:  Loud   Thought content:   Appropriate to Mood and Circumstances   Preoccupation:   Suicide; Homicidal   Hallucinations:   None   Organization:  No data recorded  [employer] of Knowledge:   Average   Intelligence:   Needs investigation   Abstraction:   Normal   Judgement:   Poor   Reality Testing:   Adequate   Insight:   Poor   Decision Making:   Impulsive   Social Functioning  Social Maturity:   Impulsive   Social Judgement:   Victimized; Heedless   Stress  Stressors:   Housing; Relationship; Family conflict   Coping Ability:   Exhausted   Skill Deficits:   Decision making; Self-control   Supports:   Friends/Service system; Family     Religion: Religion/Spirituality Are You A Religious Person?: No What is Your Religious Affiliation?: Christian How Might This Affect Treatment?: Not assessed  Leisure/Recreation: Leisure / Recreation Do You Have Hobbies?: No  Exercise/Diet: Exercise/Diet Do You Exercise?: Yes What Type of Exercise Do You Do?: Run/Walk How Many Times a Week Do You Exercise?: 6-7 times a week Have You Gained or Lost A Significant Amount of Weight in the Past Six Months?: No Do You Follow a Special Diet?: No Do You Have Any Trouble Sleeping?: Yes Explanation of Sleeping Difficulties: Pt reports trouble falling asleep   CCA Employment/Education Employment/Work Situation: Employment / Work Situation Employment Situation: On disability Why is Patient on Disability: Schizoaffective disorder How Long has Patient Been on Disability: "Since I was 30 yrs old." Patient's Job has Been Impacted by Current Illness:  (N/A) What is the Longest  Time Patient has Held a Job?: N/A Where was the Patient Employed at that Time?: N/A Has Patient ever Been in the [employer]?: No  Education: Education Last Grade Completed: 11 Name of High School: [employer] Did Teacher, adult education From [employer]?: No Did You Nutritional therapist?: No Did Heritage manager?: No Did You Have Any Special Interests In School?: "I was in special education classess....so not really." Did You Have An Individualized Education Program (IIEP): No Did You Have Any Difficulty At School?: Yes Were Any Medications Ever Prescribed For These Difficulties?: Yes Medications Prescribed For School Difficulties?: Unknown   CCA Family/Childhood History Family and Relationship History: Family history Marital status: Long term relationship Long term relationship, how long?: 2 months What types of issues is patient dealing with in the relationship?: frequent fights, yelling at each other Additional relationship information: relationship for 2 months Are you  sexually active?: Yes What is your sexual orientation?: Heterosexual Has your sexual activity been affected by drugs, alcohol, medication, or emotional stress?: Client denies.  Does patient have children?: Yes How many children?: 3 How is patient's relationship with their children?: Pt states she doesn't have any relationship with her children; states they were given up for adoption.  Childhood History:  Childhood History By whom was/is the patient raised?: Mother Additional childhood history information: Patient reports "I only seen him (father) about three times in my life and now he don't want nothing to do with me." Description of patient's relationship with caregiver when they were a child: Patient reports "it was good, she raised me good".  Patient's description of current relationship with people who raised him/her: Does not have relationship How were you disciplined when you got in trouble as a  child/adolescent?: Patient reports "nothing". Does patient have siblings?: Yes Number of Siblings: 2 Description of patient's current relationship with siblings: Patient reports "I have two sisters, one I get along with one of them.  The other I don't hear from that much.  Ever since she married that crazy boy." Did patient suffer any verbal/emotional/physical/sexual abuse as a child?: No Did patient suffer from severe childhood neglect?: No Has patient ever been sexually abused/assaulted/raped as an adolescent or adult?: No Was the patient ever a victim of a crime or a disaster?: No Witnessed domestic violence?: No Has patient been affected by domestic violence as an adult?: No  Child/Adolescent Assessment:     CCA Substance Use Alcohol/Drug Use: Alcohol / Drug Use Pain Medications: Please see MAR Prescriptions: Please see MAR Over the Counter: Please see MAR History of alcohol / drug use?: No history of alcohol / drug abuse Longest period of sobriety (when/how long): N/A Negative Consequences of Use:  (Denies) Withdrawal Symptoms: Patient aware of relationship between substance abuse and physical/medical complications                         ASAM's:  Six Dimensions of Multidimensional Assessment  Dimension 1:  Acute Intoxication and/or Withdrawal Potential:      Dimension 2:  Biomedical Conditions and Complications:      Dimension 3:  Emotional, Behavioral, or Cognitive Conditions and Complications:     Dimension 4:  Readiness to Change:     Dimension 5:  Relapse, Continued use, or Continued Problem Potential:     Dimension 6:  Recovery/Living Environment:     ASAM Severity Score:    ASAM Recommended Level of Treatment: ASAM Recommended Level of Treatment:  (N/A)   Substance use Disorder (SUD) Substance Use Disorder (SUD)  Checklist Symptoms of Substance Use:  (N/A)  Recommendations for Services/Supports/Treatments: Recommendations for  Services/Supports/Treatments Recommendations For Services/Supports/Treatments: Medication Management, Residential-Level 1, Day Treatment, ACCTT (Assertive Community Treatment), Peer Support, Peer Support Services, Other (Comment) (Group home)  DSM5 Diagnoses: Patient Active Problem List   Diagnosis Date Noted   Syphilis 07/15/2020   Malingering 06/05/2020   Gastroesophageal reflux disease 05/04/2020   Hyperglycemia due to type 2 diabetes mellitus ([place]) 05/04/2020   Long term (current) use of insulin ([place]) 05/04/2020   Migraine without aura 05/04/2020   Morbid obesity ([place]) 05/04/2020   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 2 diabetes mellitus ([place]) 05/04/2020   Prolapsed internal hemorrhoids 05/04/2020   Vitamin D deficiency 05/04/2020   Other symptoms and signs involving cognitive functions and awareness 05/04/2020   Suicide attempt [facility])    Anxiety state 03/06/2020  Schizophrenia ([place]) 09/13/2019   Bipolar I disorder, most recent episode depressed ([place]) 06/23/2019   MDD (major depressive disorder) 10/10/2018   Schizoaffective disorder, bipolar type ([place]) 09/25/2018   Affective psychosis, bipolar ([place]) 06/13/2018   HTN (hypertension) 05/03/2018   Tobacco use disorder 05/03/2018   Adjustment disorder with emotional disturbance 01/02/2018   Schizophrenia, 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   Moderate bipolar I disorder, most recent episode depressed ([place])    Psychosis ([place])    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/2017   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   OCD (obsessive compulsive disorder) 10/05/2016   Major depressive disorder, recurrent episode, mild ([place]) 05/04/2016   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   Learning disability 07/18/2015   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/2015   Diabetes mellitus ([place]) 07/18/2015   MDD (major depressive disorder), recurrent, severe, 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   Hyperlipidemia 07/18/2015   Severe episode of  recurrent major depressive disorder, without psychotic features ([name])    Suicidal ideation    Drug overdose    Cognitive deficits 10/12/2012   Generalized anxiety disorder 06/28/2012    Patient Centered Plan: Patient is on the following Treatment Plan(s):    Referrals to Alternative Service(s): Referred to Alternative Service(s):   Place:   Date:   Time:    Referred to Alternative Service(s):   Place:   Date:   Time:    Referred to Alternative Service(s):   Place:   Date:   Time:    Referred to Alternative Service(s):   Place:   Date:   Time:     Orvis Brill, LCSW

## 2020-10-03 NOTE — ED Notes (Signed)
Ordered Regular Diet dinner tray for patient per RN Hennie Duos request

## 2020-10-03 NOTE — ED Provider Notes (Signed)
Emergency Medicine Provider Triage Evaluation Note  [name] , a 30 y.o. female  was evaluated in triage.  Pt complains of SI with plan to OD on pills. History of similar. Denies HI, AVH however appear to be talking to voices that are not present in room during exam. States complaints with Haldol at home however states "they took me off my schizophrenia meds." Denies pain  Review of Systems  Positive: SI Negative: HI, AVH  Physical Exam  LMP 09/15/2020  Gen:   Awake, no distress   Resp:  Normal effort  MSK:   Moves extremities without difficulty  Psych:  SI, denies HI. Appear to be responding to internal stimuli in room speaking to a person not present in room Other:    Medical Decision Making  Medically screening exam initiated at 1:54 PM.  Appropriate orders placed.  Sussie Kymberly Blomberg was informed that the remainder of the evaluation will be completed by another provider, this initial triage assessment does not replace that evaluation, and the importance of remaining in the ED until their evaluation is complete.  SI   Qamar Rosman A, PA-C 10/03/20 1358    Valarie Merino, MD 10/06/20 1123

## 2020-10-03 NOTE — ED Triage Notes (Signed)
Patient here with complaint of suicidal ideations, reports history of overdose attempts and plans to overdose again. Patient cooperative but shouting at her self. Patient alert, oriented, and in no apparent distress. Patient expresses frustration about a provider she has been working with to find her a group home.

## 2020-10-04 LAB — CBG MONITORING, ED: Glucose-Capillary: 240 mg/dL — ABNORMAL HIGH (ref 70–99)

## 2020-10-04 NOTE — ED Provider Notes (Signed)
Patient cleared by behavioral health for discharge home.   Fredia Sorrow, MD 10/04/20 1145

## 2020-10-04 NOTE — ED Notes (Signed)
Personal belongings given back to pt

## 2020-10-04 NOTE — ED Notes (Signed)
Patient verbalizes understanding of discharge instructions. Opportunity for questioning and answers were provided. Pt discharged from ED. 

## 2020-10-04 NOTE — Progress Notes (Addendum)
This provider has contacted [facility] team four times this morning since 8 am. Awaiting to hear back from a representative at [facility] to establish a safety plan and transport.   Update 10:37 am: Tyrone Apple with [facility] returned this provider's phone call to establish a safety plan for the patient. Sharyn Lull states that she is unable to pick up the patient from the hospital, but will follow up with the patient today at her home. Sharyn Lull is requesting to have the patient transported home via bus or taxi. No safety concern verbalized with patient returning home. Sharyn Lull states that the patient is seen frequently, at least two to three times per week for crisis management and has therapy in place every other week.

## 2020-10-04 NOTE — Discharge Instructions (Addendum)
Patient cleared by behavioral health for discharge home.  Follow-up as per behavioral health.

## 2020-10-04 NOTE — ED Notes (Signed)
Pt alert, calm cooperative; pt reports continued feelings of depression, SI; TTS in progress

## 2020-10-04 NOTE — Consult Note (Addendum)
Telepsych Consultation   Reason for Consult:  Suicidal  Referring Physician:  Marda Stalker, MD. Location of Patient: [facility] Location of Provider: Other: [facility]  Patient Identification: [name] MRN:  [id number] Principal Diagnosis: Suicidal ideation Diagnosis:  Principal Problem:   Suicidal ideation Active Problems:   Severe episode of recurrent major depressive disorder, without psychotic features ([place])   Schizoaffective disorder, bipolar type ([place])   Total Time spent with patient: 20 minutes  Subjective:Patient is a 30 year old female presenting voluntarily to [facility] ED via [facility] reporting suicidal ideation with a plan to overdose.  HPI: [name] is a 30 y.o.  patient seen via tele health by this provider; chart reviewed and consulted with Dr. Dwyane Dee on 10/04/20.  On evaluation  reports "I am suicidal, severely depressed and have anxiety." She denies current stressors, but states that she keeps arguing with her boyfriend." She states that she's been feeling this way for a "couple weeks." She states, "I am depressed for no reason. I keep coming to get help and they keep sending me home. Send me to [facility]." When asked if she's spoken to someone at [facility], she states "no, they barely stay in contact. I don't hear from them."   During evaluation [name] is in a lying down position in bed looking towards the camera. She is alert/oriented x 4 and answers questions appropriately. She is calm/cooperative. Mood is depressed and congruent with affect. She is speaking in a clear tone at moderate volume, and normal pace; with good eye contact. Her thought process is coherent and speech is logical. She endorses suicidal thoughts with a plan to overdose on medicines, which appears to be chronic. She denies self-harm behaviors. She denies homicidal ideation. She denies auditory and visual hallucinations. There is no indication that she he is currently responding to  internal/external stimuli or experiencing delusional thought content, psychosis, and paranoia. She denies drinking alcohol or using illicit drugs.  Patient has frequent admissions with the [facility] and Emergency Departments with similar presentations. Patient is chronically suicidal in nature with plans to overdose on medications. Patient has a long psychiatric history. Patient has ACT services in place with [facility] to help provide support and outpatient resources for safety and mood stabilization. Patient informed that this provider will contact [facility] this morning to discuss and set up a treatment plan. Patient verbalizes understanding.   Safety plan: Patient denies having access to weapons or guns. Tyrone Apple with [facility] returned this provider's phone call to establish a safety plan for the patient. Sharyn Lull states that she is unable to pick up the patient from the hospital, but will follow up with the patient today at her home. Sharyn Lull is requesting to have the patient transported home via bus or taxi. No safety concern verbalized with patient returning home today. Sharyn Lull states that the patient is seen frequently, at least two to three times per week for crisis management and has therapy in place every other week.   Past Psychiatric History: Schizoaffective disorder, MDD, Anxiety, Bipolar 1 disorder, and Chronic Suicidal Ideations  Risk to Self: chronically endorses suicidal thoughts  Risk to Others:  denies  Prior Inpatient Therapy:  Yes  Prior Outpatient Therapy:  Yes  Past Medical History:  Past Medical History:  Diagnosis Date   Anxiety    Bipolar 1 disorder ([place])    Cognitive deficits    Depression    Diabetes mellitus without complication ([place])  Hypertension    Mental disorder    Mental health disorder    Obesity     Past Surgical History:  Procedure Laterality Date   CESAREAN SECTION     CESAREAN SECTION N/A  04/25/2013   Procedure: REPEAT CESAREAN SECTION;  Surgeon: [name], MD;  Location: [place] [facility];  Service: Obstetrics;  Laterality: N/A;   MASS EXCISION N/A 06/03/2012   Procedure: EXCISION MASS;  Surgeon: [name], MD;  Location: [place];  Service: ENT;  Laterality: N/A;  Excision uvula mass   TONSILLECTOMY N/A 06/03/2012   Procedure: TONSILLECTOMY;  Surgeon: [name], MD;  Location: [place];  Service: ENT;  Laterality: N/A;   TONSILLECTOMY     Family History:  Family History  Problem Relation Age of Onset   Hypertension Mother    Diabetes Father    Family Psychiatric  History: Unknown  Social History:  Social History   Substance and Sexual Activity  Alcohol Use Not Currently   Comment: occ: last intake 4 mts ago     Social History   Substance and Sexual Activity  Drug Use Not Currently   Types: "Crack" cocaine, Other-see comments   Comment: Patient reports hx of smoking Crack    Social History   Socioeconomic History   Marital status: Single    Spouse name: Not on file   Number of children: Not on file   Years of education: Not on file   Highest education level: Not on file  Occupational History   Not on file  Tobacco Use   Smoking status: Every Day    Packs/day: 4.00    Years: 11.00    Pack years: 44.00    Types: Cigarettes, Cigars   Smokeless tobacco: Never   Tobacco comments:    Pt declined  Vaping Use   Vaping Use: Never used  Substance and Sexual Activity   Alcohol use: Not Currently    Comment: occ: last intake 4 mts ago   Drug use: Not Currently    Types: "Crack" cocaine, Other-see comments    Comment: Patient reports hx of smoking Crack   Sexual activity: Yes    Birth control/protection: Implant  Other Topics Concern   Not on file  Social History Narrative   ** Merged History Encounter **       Social Determinants of Health   Financial Resource Strain: Not on file  Food Insecurity: Not on file   Transportation Needs: Not on file  Physical Activity: Not on file  Stress: Not on file  Social Connections: Not on file   Additional Social History:    Allergies:   Allergies  Allergen Reactions   Wellbutrin [Bupropion] Shortness Of Breath   Omnipaque [Iohexol] Swelling and Other (See Comments)    Eye swelling   Contrast Media [Iodinated Diagnostic Agents]     Eyes swell up   Depakote [Divalproex Sodium]     Mouth sores. dizziness   Penicillin G Hives    Labs:  Results for orders placed or performed during the hospital encounter of 10/03/20 (from the past 48 hour(s))  Rapid urine drug screen (hospital performed)     Status: Abnormal   Collection Time: 10/03/20  1:56 PM  Result Value Ref Range   Opiates NONE DETECTED NONE DETECTED   Cocaine NONE DETECTED NONE DETECTED   Benzodiazepines NONE DETECTED NONE DETECTED   Amphetamines NONE DETECTED NONE DETECTED   Tetrahydrocannabinol POSITIVE (A) NONE DETECTED   Barbiturates POSITIVE (A)  NONE DETECTED    Comment: (NOTE) DRUG SCREEN FOR MEDICAL PURPOSES ONLY.  IF CONFIRMATION IS NEEDED FOR ANY PURPOSE, NOTIFY LAB WITHIN 5 DAYS.  LOWEST DETECTABLE LIMITS FOR URINE DRUG SCREEN Drug Class                     Cutoff (ng/mL) Amphetamine and metabolites    1000 Barbiturate and metabolites    200 Benzodiazepine                 308 Tricyclics and metabolites     300 Opiates and metabolites        300 Cocaine and metabolites        300 THC                            50 Performed at [facility], [place] [address] [place], [place] [address]   CBC with Differential     Status: Abnormal   Collection Time: 10/03/20  2:00 PM  Result Value Ref Range   WBC 10.0 4.0 - 10.5 K/uL   RBC 4.71 3.87 - 5.11 MIL/uL   Hemoglobin 11.4 (L) 12.0 - 15.0 g/dL   HCT 36.1 36.0 - 46.0 %   MCV 76.6 (L) 80.0 - 100.0 fL   MCH 24.2 (L) 26.0 - 34.0 pg   MCHC 31.6 30.0 - 36.0 g/dL   RDW 16.7 (H) 11.5 - 15.5 %   Platelets 238 150 - 400 K/uL    nRBC 0.0 0.0 - 0.2 %   Neutrophils Relative % 55 %   Neutro Abs 5.5 1.7 - 7.7 K/uL   Lymphocytes Relative 34 %   Lymphs Abs 3.4 0.7 - 4.0 K/uL   Monocytes Relative 5 %   Monocytes Absolute 0.5 0.1 - 1.0 K/uL   Eosinophils Relative 5 %   Eosinophils Absolute 0.5 0.0 - 0.5 K/uL   Basophils Relative 1 %   Basophils Absolute 0.1 0.0 - 0.1 K/uL   Immature Granulocytes 0 %   Abs Immature Granulocytes 0.02 0.00 - 0.07 K/uL    Comment: Performed at [facility] [address] [place], [place] [address]  Comprehensive metabolic panel     Status: Abnormal   Collection Time: 10/03/20  2:00 PM  Result Value Ref Range   Sodium 140 135 - 145 mmol/L   Potassium 4.0 3.5 - 5.1 mmol/L   Chloride 103 98 - 111 mmol/L   CO2 26 22 - 32 mmol/L   Glucose, Bld 274 (H) 70 - 99 mg/dL    Comment: Glucose reference range applies only to samples taken after fasting for at least 8 hours.   BUN 7 6 - 20 mg/dL   Creatinine, Ser 0.91 0.44 - 1.00 mg/dL   Calcium 9.1 8.9 - 10.3 mg/dL   Total Protein 6.6 6.5 - 8.1 g/dL   Albumin 3.6 3.5 - 5.0 g/dL   AST 22 15 - 41 U/L   ALT 14 0 - 44 U/L   Alkaline Phosphatase 49 38 - 126 U/L   Total Bilirubin 0.6 0.3 - 1.2 mg/dL   GFR, Estimated >60 >60 mL/min    Comment: (NOTE) Calculated using the CKD-EPI Creatinine Equation (2021)    Anion gap 11 5 - 15    Comment: Performed at [facility] [address] [place], [place] [address]  Acetaminophen level     Status: Abnormal   Collection Time: 10/03/20  2:00 PM  Result Value Ref Range   Acetaminophen (Tylenol), Serum <10 (L) 10 - 30 ug/mL    Comment: (NOTE) Therapeutic concentrations vary significantly. A range of 10-30 ug/mL  may be an effective concentration for many patients. However, some  are best treated at concentrations outside of this range. Acetaminophen concentrations >150 ug/mL at 4 hours after ingestion  and >50 ug/mL at 12 hours after ingestion are often associated with  toxic  reactions.  Performed at [facility], [place] [address] [place], [place] [address]   Salicylate level     Status: Abnormal   Collection Time: 10/03/20  2:00 PM  Result Value Ref Range   Salicylate Lvl <8.7 (L) 7.0 - 30.0 mg/dL    Comment: Performed at [facility] [address], [place], [place] [address]  Ethanol     Status: None   Collection Time: 10/03/20  2:00 PM  Result Value Ref Range   Alcohol, Ethyl (B) <10 <10 mg/dL    Comment: (NOTE) Lowest detectable limit for serum alcohol is 10 mg/dL.  For medical purposes only. Performed at [facility], [place] [address], [place], [place] [address]   I-Stat Beta hCG blood, ED ([facility], [facility], [facility] only)     Status: None   Collection Time: 10/03/20  2:49 PM  Result Value Ref Range   I-stat hCG, quantitative <5.0 <5 mIU/mL   Comment 3            Comment:   GEST. AGE      CONC.  (mIU/mL)   <=1 WEEK        5 - 50     2 WEEKS       50 - 500     3 WEEKS       100 - 10,000     4 WEEKS     1,000 - 30,000        FEMALE AND NON-PREGNANT FEMALE:     LESS THAN 5 mIU/mL   SARS CORONAVIRUS 2 (TAT 6-24 HRS) Nasopharyngeal Nasopharyngeal Swab     Status: None   Collection Time: 10/03/20  4:48 PM   Specimen: Nasopharyngeal Swab  Result Value Ref Range   SARS Coronavirus 2 NEGATIVE NEGATIVE    Comment: (NOTE) SARS-CoV-2 target nucleic acids are NOT DETECTED.  The SARS-CoV-2 RNA is generally detectable in upper and lower respiratory specimens during the acute phase of infection. Negative results do not preclude SARS-CoV-2 infection, do not rule out co-infections with other pathogens, and should not be used as the sole basis for treatment or other patient management decisions. Negative results must be combined with clinical observations, patient history, and epidemiological information. The expected result is Negative.  Fact Sheet for Patients: SugarRoll.be  Fact Sheet for Healthcare  Providers: https://www.woods-mathews.com/  This test is not yet approved or cleared by the [place] FDA and  has been authorized for detection and/or diagnosis of SARS-CoV-2 by FDA under an Emergency Use Authorization (EUA). This EUA will remain  in effect (meaning this test can be used) for the duration of the COVID-19 declaration under Se ction 564(b)(1) of the Act, 21 U.S.C. section 360bbb-3(b)(1), unless the authorization is terminated or revoked sooner.  Performed at [facility], [place] [address] [place], [place] [address]   POC CBG, ED     Status: Abnormal   Collection Time: 10/03/20 10:07 PM  Result Value Ref Range   Glucose-Capillary 177 (H) 70 - 99 mg/dL  Comment: Glucose reference range applies only to samples taken after fasting for at least 8 hours.    Medications:  Current Facility-Administered Medications  Medication Dose Route Frequency Provider Last Rate Last Admin   amLODipine (NORVASC) tablet 5 mg  5 mg Oral Daily Pearson Grippe, DO       benztropine (COGENTIN) tablet 1 mg  1 mg Oral BID Pearson Grippe, DO   1 mg at 10/04/20 0010   haloperidol (HALDOL) tablet 10 mg  10 mg Oral QHS Pearson Grippe, DO   10 mg at 10/04/20 0010   hydrOXYzine (ATARAX/VISTARIL) tablet 50 mg  50 mg Oral TID Pearson Grippe, DO   50 mg at 10/04/20 0011   insulin detemir (LEVEMIR) injection 40 Units  40 Units Subcutaneous BID Pearson Grippe, DO   40 Units at 10/04/20 0011   lithium carbonate (ESKALITH) CR tablet 450 mg  450 mg Oral Daily Pearson Grippe, DO       pantoprazole (PROTONIX) EC tablet 40 mg  40 mg Oral Daily Pearson Grippe, DO       prazosin (MINIPRESS) capsule 5 mg  5 mg Oral QHS Pearson Grippe, DO   5 mg at 10/04/20 0011   primidone (MYSOLINE) tablet 50 mg  50 mg Oral TID Pearson Grippe, DO   50 mg at 10/04/20 0011   Current Outpatient Medications  Medication Sig Dispense Refill   amLODipine (NORVASC) 5 MG tablet Take 5 mg by  mouth daily.     benztropine (COGENTIN) 1 MG tablet Take 1 mg by mouth 2 (two) times daily.     cholecalciferol (VITAMIN D) 25 MCG (1000 UNIT) tablet Take 1,000 Units by mouth daily.     haloperidol (HALDOL) 10 MG tablet Take 10 mg by mouth at bedtime.     haloperidol decanoate (HALDOL DECANOATE) 100 MG/ML injection Inject 150 mg into the muscle every 28 (twenty-eight) days. Inject 150mg  (1.82ml) every 28 days.     hydrOXYzine (VISTARIL) 50 MG capsule Take 50 mg by mouth 3 (three) times daily.     insulin detemir (LEVEMIR) 100 UNIT/ML injection Inject 0.4 mLs (40 Units total) into the skin 2 (two) times daily. 10 mL 11   lithium carbonate (ESKALITH) 450 MG CR tablet Take 450 mg by mouth daily.     omeprazole (PRILOSEC) 20 MG capsule Take 20 mg by mouth daily.     ondansetron (ZOFRAN) 4 MG tablet Take 1 tablet (4 mg total) by mouth every 6 (six) hours. 12 tablet 0   prazosin (MINIPRESS) 5 MG capsule Take 5 mg by mouth at bedtime.     primidone (MYSOLINE) 50 MG tablet Take 50 mg by mouth 3 (three) times daily.     simvastatin (ZOCOR) 20 MG tablet Take 20 mg by mouth daily.     vitamin B-12 (CYANOCOBALAMIN) 1000 MCG tablet Take 1,000 mcg by mouth daily.     divalproex (DEPAKOTE) 500 MG DR tablet Take 2 tablets (1,000 mg total) by mouth every 12 (twelve) hours. (Patient not taking: No sig reported)     hydrOXYzine (ATARAX/VISTARIL) 25 MG tablet Take 1 tablet (25 mg total) by mouth 3 (three) times daily as needed for anxiety. (Patient not taking: Reported on 10/03/2020) 30 tablet 0   nystatin (MYCOSTATIN) 100000 UNIT/ML suspension Use as directed 5 mLs (500,000 Units total) in the mouth or throat 4 (four) times daily. Swish around in your mouth and swallow. (Patient not taking: No sig reported) 60 mL 0    Musculoskeletal: Strength & Muscle  Tone:  [place]:  UTA Patient leans: N/A   Psychiatric Specialty Exam:  Presentation  General Appearance: Appropriate for Environment  Eye  Contact:Good  Speech:Clear and Coherent  Speech Volume:Normal  Handedness:Right   Mood and Affect  Mood:Depressed  Affect:Congruent   Thought Process  Thought Processes:Coherent  Descriptions of Associations:Intact  Orientation:Full (Time, Place and Person)  Thought Content:Logical  History of Schizophrenia/Schizoaffective disorder:Yes  Duration of Psychotic Symptoms:Greater than six months  Hallucinations:Hallucinations: None  Ideas of Reference:None  Suicidal Thoughts:Suicidal Thoughts: Yes, Active (Chronic suicidal ideations with plan to overdose) SI Active Intent and/or Plan: With Intent; With Plan  Homicidal Thoughts:Homicidal Thoughts: No   Sensorium  Memory:Immediate Fair; Recent Fair; Remote Fair  Judgment:Poor  Insight:Lacking   Executive Functions  Concentration:Fair  Attention Span:Fair  [place]   Psychomotor Activity  Psychomotor Activity:Psychomotor Activity: Normal   Assets  Assets:Communication Skills; Desire for Improvement; Housing; Physical Health; Leisure Time; Social Support   Sleep  Sleep:Sleep: Fair Number of Hours of Sleep: 7   Physical Exam: Physical Exam Constitutional:      Appearance: Normal appearance.  HENT:     Head: Normocephalic.  Cardiovascular:     Rate and Rhythm: Normal rate.     Comments: Hypotensive  Pulmonary:     Effort: Pulmonary effort is normal.  Neurological:     Mental Status: She is alert.  Psychiatric:        Attention and Perception: Attention normal.        Mood and Affect: Mood is depressed.        Speech: Speech normal.        Behavior: Behavior is cooperative.        Thought Content: Thought content is not delusional. Thought content includes suicidal ideation. Thought content does not include homicidal ideation.        Cognition and Memory: Cognition normal.   Review of Systems  Constitutional: Negative.   HENT: Negative.     Eyes: Negative.   Respiratory: Negative.    Cardiovascular: Negative.   Gastrointestinal: Negative.   Genitourinary: Negative.   Musculoskeletal: Negative.   Skin: Negative.   Neurological: Negative.   Endo/Heme/Allergies: Negative.   Psychiatric/Behavioral:  Positive for depression and suicidal ideas. Negative for hallucinations and substance abuse. The patient is nervous/anxious. The patient does not have insomnia.   Blood pressure (!) 98/57, pulse 73, temperature 98.7 F (37.1 C), resp. rate 15, last menstrual period 09/15/2020, SpO2 98 %. There is no height or weight on file to calculate BMI.  Treatment Plan Summary:  Tyrone Apple with [facility] will follow up with the patient today at her home.   Continue home medications.   amLODipine  5 mg Oral Daily   benztropine  1 mg Oral BID   haloperidol  10 mg Oral QHS   hydrOXYzine  50 mg Oral TID   insulin detemir  40 Units Subcutaneous BID   lithium carbonate  450 mg Oral Daily   pantoprazole  40 mg Oral Daily   prazosin  5 mg Oral QHS   primidone  50 mg Oral TID   Social work/TOC consult ordered to help provide transportation.  Disposition: Patient is psychiatrically cleared.  Patient does not meet criteria for psychiatric inpatient admission. Supportive therapy provided about ongoing stressors. Discussed crisis plan, support from social network, calling 911, coming to the Emergency Department, and calling Suicide Hotline.  This service was provided via telemedicine using a 2-way,  interactive audio and Radiographer, therapeutic.  Names of all persons participating in this telemedicine service and their role in this encounter. Name: [name]  Role: Patient   Name: Darrol Angel  Role: NP  Name: Dr. Dwyane Dee  Role: Psychiatrist   Name: Gery Pray Kretschmer  Role: RN (nurse updated via secure chat on the pt's disposition and plan of care).    Marissa Calamity, NP 10/04/2020 8:32 AM

## 2020-10-04 NOTE — ED Notes (Signed)
Breakfast at bedside.

## 2020-10-09 ENCOUNTER — Other Ambulatory Visit: Payer: Self-pay | Admitting: Physician Assistant

## 2020-10-09 DIAGNOSIS — N644 Mastodynia: Secondary | ICD-10-CM

## 2020-10-17 ENCOUNTER — Encounter (HOSPITAL_COMMUNITY): Payer: Self-pay | Admitting: *Deleted

## 2020-10-17 ENCOUNTER — Encounter (HOSPITAL_COMMUNITY): Payer: Self-pay | Admitting: Pharmacy Technician

## 2020-10-17 ENCOUNTER — Other Ambulatory Visit: Payer: Self-pay

## 2020-10-17 ENCOUNTER — Ambulatory Visit (HOSPITAL_COMMUNITY)
Admission: EM | Admit: 2020-10-17 | Discharge: 2020-10-18 | Disposition: A | Discharge status: Home / Self Care | Payer: Medicaid Other | Source: Home / Self Care

## 2020-10-17 ENCOUNTER — Emergency Department (HOSPITAL_COMMUNITY)
Admission: EM | Admit: 2020-10-17 | Discharge: 2020-10-17 | Disposition: A | Discharge status: Other Acute Inpatient Hospital | Payer: Medicaid Other | Attending: Emergency Medicine | Admitting: Emergency Medicine

## 2020-10-17 ENCOUNTER — Emergency Department (HOSPITAL_COMMUNITY)
Admission: EM | Admit: 2020-10-17 | Discharge: 2020-10-17 | Disposition: A | Discharge status: Home / Self Care | Payer: Medicaid Other | Attending: Emergency Medicine | Admitting: Emergency Medicine

## 2020-10-17 DIAGNOSIS — E1142 Type 2 diabetes mellitus with diabetic polyneuropathy: Secondary | ICD-10-CM | POA: Diagnosis not present

## 2020-10-17 DIAGNOSIS — Z91041 Radiographic dye allergy status: Secondary | ICD-10-CM | POA: Insufficient documentation

## 2020-10-17 DIAGNOSIS — Z79899 Other long term (current) drug therapy: Secondary | ICD-10-CM | POA: Insufficient documentation

## 2020-10-17 DIAGNOSIS — F209 Schizophrenia, unspecified: Secondary | ICD-10-CM | POA: Diagnosis present

## 2020-10-17 DIAGNOSIS — R456 Violent behavior: Secondary | ICD-10-CM | POA: Insufficient documentation

## 2020-10-17 DIAGNOSIS — Z880 Allergy status to penicillin: Secondary | ICD-10-CM | POA: Insufficient documentation

## 2020-10-17 DIAGNOSIS — F17210 Nicotine dependence, cigarettes, uncomplicated: Secondary | ICD-10-CM | POA: Insufficient documentation

## 2020-10-17 DIAGNOSIS — F4325 Adjustment disorder with mixed disturbance of emotions and conduct: Secondary | ICD-10-CM

## 2020-10-17 DIAGNOSIS — F250 Schizoaffective disorder, bipolar type: Secondary | ICD-10-CM

## 2020-10-17 DIAGNOSIS — Z5321 Procedure and treatment not carried out due to patient leaving prior to being seen by health care provider: Secondary | ICD-10-CM | POA: Diagnosis not present

## 2020-10-17 DIAGNOSIS — Z794 Long term (current) use of insulin: Secondary | ICD-10-CM | POA: Diagnosis not present

## 2020-10-17 DIAGNOSIS — R45851 Suicidal ideations: Secondary | ICD-10-CM | POA: Insufficient documentation

## 2020-10-17 DIAGNOSIS — Z20822 Contact with and (suspected) exposure to covid-19: Secondary | ICD-10-CM | POA: Diagnosis not present

## 2020-10-17 DIAGNOSIS — Z9151 Personal history of suicidal behavior: Secondary | ICD-10-CM | POA: Insufficient documentation

## 2020-10-17 DIAGNOSIS — I10 Essential (primary) hypertension: Secondary | ICD-10-CM | POA: Insufficient documentation

## 2020-10-17 LAB — CBC WITH DIFFERENTIAL/PLATELET
Abs Immature Granulocytes: 0.01 10*3/uL (ref 0.00–0.07)
Basophils Absolute: 0 10*3/uL (ref 0.0–0.1)
Basophils Relative: 1 %
Eosinophils Absolute: 0.3 10*3/uL (ref 0.0–0.5)
Eosinophils Relative: 4 %
HCT: 33.2 % — ABNORMAL LOW (ref 36.0–46.0)
Hemoglobin: 10.4 g/dL — ABNORMAL LOW (ref 12.0–15.0)
Immature Granulocytes: 0 %
Lymphocytes Relative: 40 %
Lymphs Abs: 2.6 10*3/uL (ref 0.7–4.0)
MCH: 24.1 pg — ABNORMAL LOW (ref 26.0–34.0)
MCHC: 31.3 g/dL (ref 30.0–36.0)
MCV: 76.9 fL — ABNORMAL LOW (ref 80.0–100.0)
Monocytes Absolute: 0.4 10*3/uL (ref 0.1–1.0)
Monocytes Relative: 6 %
Neutro Abs: 3.2 10*3/uL (ref 1.7–7.7)
Neutrophils Relative %: 49 %
Platelets: 194 10*3/uL (ref 150–400)
RBC: 4.32 MIL/uL (ref 3.87–5.11)
RDW: 16.8 % — ABNORMAL HIGH (ref 11.5–15.5)
WBC: 6.5 10*3/uL (ref 4.0–10.5)
nRBC: 0 % (ref 0.0–0.2)

## 2020-10-17 LAB — COMPREHENSIVE METABOLIC PANEL
ALT: 16 U/L (ref 0–44)
AST: 21 U/L (ref 15–41)
Albumin: 3.6 g/dL (ref 3.5–5.0)
Alkaline Phosphatase: 48 U/L (ref 38–126)
Anion gap: 7 (ref 5–15)
BUN: 13 mg/dL (ref 6–20)
CO2: 25 mmol/L (ref 22–32)
Calcium: 8.9 mg/dL (ref 8.9–10.3)
Chloride: 108 mmol/L (ref 98–111)
Creatinine, Ser: 0.82 mg/dL (ref 0.44–1.00)
GFR, Estimated: >60 mL/min (ref >60)
Glucose, Bld: 111 mg/dL — ABNORMAL HIGH (ref 70–99)
Potassium: 3.7 mmol/L (ref 3.5–5.1)
Sodium: 140 mmol/L (ref 135–145)
Total Bilirubin: 0.3 mg/dL (ref 0.3–1.2)
Total Protein: 6.6 g/dL (ref 6.5–8.1)

## 2020-10-17 LAB — GLUCOSE, CAPILLARY: Glucose-Capillary: 120 mg/dL — ABNORMAL HIGH (ref 70–99)

## 2020-10-17 LAB — CBG MONITORING, ED: Glucose-Capillary: 71 mg/dL (ref 70–99)

## 2020-10-17 LAB — ACETAMINOPHEN LEVEL: Acetaminophen (Tylenol), Serum: <10 ug/mL — ABNORMAL LOW (ref 10–30)

## 2020-10-17 LAB — SALICYLATE LEVEL: Salicylate Lvl: <7 mg/dL — ABNORMAL LOW (ref 7.0–30.0)

## 2020-10-17 LAB — RESP PANEL BY RT-PCR (FLU A&B, COVID) ARPGX2
Influenza A by PCR: NEGATIVE
Influenza B by PCR: NEGATIVE
SARS Coronavirus 2 by RT PCR: NEGATIVE

## 2020-10-17 LAB — I-STAT BETA HCG BLOOD, ED (MC, WL, AP ONLY): I-stat hCG, quantitative: <5 m[IU]/mL (ref <5)

## 2020-10-17 LAB — RAPID URINE DRUG SCREEN, HOSP PERFORMED
Amphetamines: NOT DETECTED
Barbiturates: POSITIVE — AB
Benzodiazepines: NOT DETECTED
Cocaine: NOT DETECTED
Opiates: NOT DETECTED
Tetrahydrocannabinol: NOT DETECTED

## 2020-10-17 LAB — ETHANOL: Alcohol, Ethyl (B): <10 mg/dL (ref <10)

## 2020-10-17 MED ORDER — GLIMEPIRIDE 2 MG PO TABS
4.0000 mg | ORAL_TABLET | Freq: Every day | ORAL | Status: DC
  Administered 2020-10-18: 4 mg via ORAL
  Filled 2020-10-17: qty 2

## 2020-10-17 MED ORDER — LORAZEPAM 1 MG PO TABS
1.0000 mg | ORAL_TABLET | Freq: Once | ORAL | Status: AC
  Administered 2020-10-17: 1 mg via ORAL
  Filled 2020-10-17: qty 1

## 2020-10-17 MED ORDER — ALUM & MAG HYDROXIDE-SIMETH 200-200-20 MG/5ML PO SUSP: 30.0000 mL | Freq: Four times a day (QID) | ORAL | Status: DC | PRN

## 2020-10-17 MED ORDER — HYDROXYZINE HCL 25 MG PO TABS
25.0000 mg | ORAL_TABLET | Freq: Once | ORAL | Status: AC
  Administered 2020-10-17: 25 mg via ORAL
  Filled 2020-10-17: qty 1

## 2020-10-17 MED ORDER — ZIPRASIDONE MESYLATE 20 MG IM SOLR: 20.0000 mg | INTRAMUSCULAR | Status: DC | PRN

## 2020-10-17 MED ORDER — NICOTINE 21 MG/24HR TD PT24: 21.0000 mg | MEDICATED_PATCH | Freq: Every day | TRANSDERMAL | Status: DC

## 2020-10-17 MED ORDER — HALOPERIDOL 5 MG PO TABS
10.0000 mg | ORAL_TABLET | Freq: Every day | ORAL | Status: DC
  Administered 2020-10-17: 10 mg via ORAL
  Filled 2020-10-17: qty 2

## 2020-10-17 MED ORDER — ACETAMINOPHEN 325 MG PO TABS: 650.0000 mg | ORAL_TABLET | ORAL | Status: DC | PRN

## 2020-10-17 MED ORDER — SIMVASTATIN 20 MG PO TABS
20.0000 mg | ORAL_TABLET | Freq: Every day | ORAL | Status: DC
  Administered 2020-10-18: 20 mg via ORAL
  Filled 2020-10-17: qty 1

## 2020-10-17 MED ORDER — INSULIN DETEMIR 100 UNIT/ML ~~LOC~~ SOLN
40.0000 [IU] | Freq: Two times a day (BID) | SUBCUTANEOUS | Status: DC
Start: 2020-10-17 — End: 2020-10-18
  Administered 2020-10-17 – 2020-10-18 (×2): 40 [IU] via SUBCUTANEOUS

## 2020-10-17 MED ORDER — ZOLPIDEM TARTRATE 5 MG PO TABS: 5.0000 mg | ORAL_TABLET | Freq: Every evening | ORAL | Status: DC | PRN

## 2020-10-17 MED ORDER — ALUM & MAG HYDROXIDE-SIMETH 200-200-20 MG/5ML PO SUSP: 30.0000 mL | ORAL | Status: DC | PRN

## 2020-10-17 MED ORDER — LORAZEPAM 2 MG/ML IJ SOLN
2.0000 mg | Freq: Once | INTRAMUSCULAR | Status: AC
  Administered 2020-10-17: 2 mg via INTRAMUSCULAR
  Filled 2020-10-17: qty 1

## 2020-10-17 MED ORDER — BENZTROPINE MESYLATE 1 MG PO TABS
1.0000 mg | ORAL_TABLET | Freq: Two times a day (BID) | ORAL | Status: DC
  Administered 2020-10-17 – 2020-10-18 (×2): 1 mg via ORAL
  Filled 2020-10-17 (×2): qty 1

## 2020-10-17 MED ORDER — PANTOPRAZOLE SODIUM 40 MG PO TBEC
40.0000 mg | DELAYED_RELEASE_TABLET | Freq: Every day | ORAL | Status: DC
  Administered 2020-10-18: 40 mg via ORAL
  Filled 2020-10-17: qty 1

## 2020-10-17 MED ORDER — ONDANSETRON HCL 4 MG PO TABS: 4.0000 mg | ORAL_TABLET | Freq: Three times a day (TID) | ORAL | Status: DC | PRN

## 2020-10-17 MED ORDER — HYDROXYZINE HCL 25 MG PO TABS
50.0000 mg | ORAL_TABLET | Freq: Three times a day (TID) | ORAL | Status: DC
  Administered 2020-10-18: 50 mg via ORAL
  Filled 2020-10-17: qty 2
  Filled 2020-10-17 (×2): qty 1

## 2020-10-17 MED ORDER — LITHIUM CARBONATE ER 450 MG PO TBCR
450.0000 mg | EXTENDED_RELEASE_TABLET | Freq: Every day | ORAL | Status: DC
  Administered 2020-10-18: 450 mg via ORAL
  Filled 2020-10-17: qty 1

## 2020-10-17 MED ORDER — AMLODIPINE BESYLATE 5 MG PO TABS
5.0000 mg | ORAL_TABLET | Freq: Every day | ORAL | Status: DC
  Administered 2020-10-18: 5 mg via ORAL
  Filled 2020-10-17: qty 1

## 2020-10-17 MED ORDER — RISPERIDONE 1 MG PO TBDP: 2.0000 mg | ORAL_TABLET | Freq: Three times a day (TID) | ORAL | Status: DC | PRN

## 2020-10-17 MED ORDER — PRIMIDONE 50 MG PO TABS
50.0000 mg | ORAL_TABLET | Freq: Three times a day (TID) | ORAL | Status: DC
  Administered 2020-10-18: 50 mg via ORAL
  Filled 2020-10-17 (×2): qty 1

## 2020-10-17 MED ORDER — ACETAMINOPHEN 325 MG PO TABS: 650.0000 mg | ORAL_TABLET | Freq: Four times a day (QID) | ORAL | Status: DC | PRN

## 2020-10-17 MED ORDER — MAGNESIUM HYDROXIDE 400 MG/5ML PO SUSP: 30.0000 mL | Freq: Every day | ORAL | Status: DC | PRN

## 2020-10-17 MED ORDER — VITAMIN D 25 MCG (1000 UNIT) PO TABS
1000.0000 [IU] | ORAL_TABLET | Freq: Every day | ORAL | Status: DC
  Administered 2020-10-18: 1000 [IU] via ORAL
  Filled 2020-10-17 (×2): qty 1

## 2020-10-17 MED ORDER — TRAZODONE HCL 50 MG PO TABS
50.0000 mg | ORAL_TABLET | Freq: Every evening | ORAL | Status: DC | PRN
  Administered 2020-10-17: 50 mg via ORAL
  Filled 2020-10-17: qty 1

## 2020-10-17 MED ORDER — PRAZOSIN HCL 2 MG PO CAPS
5.0000 mg | ORAL_CAPSULE | Freq: Every day | ORAL | Status: DC
  Administered 2020-10-17: 5 mg via ORAL
  Filled 2020-10-17: qty 1

## 2020-10-17 MED ORDER — HYDROXYZINE HCL 25 MG PO TABS
ORAL_TABLET | ORAL | Status: AC
  Filled 2020-10-17: qty 2

## 2020-10-17 MED ORDER — INSULIN ASPART 100 UNIT/ML IJ SOLN: 0.0000 [IU] | Freq: Three times a day (TID) | INTRAMUSCULAR | Status: DC

## 2020-10-17 MED ORDER — LORAZEPAM 1 MG PO TABS: 1.0000 mg | ORAL_TABLET | ORAL | Status: DC | PRN

## 2020-10-17 NOTE — ED Notes (Signed)
[facility] [employer] called at 20:54. Will be here "in a few minutes".

## 2020-10-17 NOTE — ED Notes (Addendum)
Pt began acting violent in front of police and was arrested and was escorted off premises.
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
## 2020-10-17 NOTE — ED Notes (Signed)
Pt has been brought on the unit and familiarized to the unit. Pts blood sugar was taken and was 120

## 2020-10-17 NOTE — BH Assessment (Signed)
Clinician secure IM-ed pt's team at 1939 to request pt be moved into a room with the Tele-Assessment machine. At 2001 clinician had not received a response. TTS will attempt to complete pt's assessment at a later time.

## 2020-10-17 NOTE — ED Notes (Signed)
Pt belongings in locker 51. (White patient belongings bag)
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
## 2020-10-17 NOTE — ED Provider Notes (Signed)
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 09/18/2020 11.1 (A) 12.0 - 15.0 g/dL Final   HCT 09/18/2020 34.9 (A) 36.0 - 46.0 % Final   MCV 09/18/2020  77.0 (A) 80.0 - 100.0 fL Final   MCH 09/18/2020 24.5 (A) 26.0 - 34.0 pg Final   MCHC 09/18/2020 31.8  30.0 - 36.0 g/dL Final   RDW 09/18/2020 16.2 (A) 11.5 - 15.5 % Final   Platelets 09/18/2020 228  150 - 400 K/uL Final   REPEATED TO VERIFY   nRBC 09/18/2020 0.0  0.0 - 0.2 % Final   Performed at [facility], [place] [address], [place], [place] [address]   Opiates 09/18/2020 NONE DETECTED  NONE DETECTED Final   Cocaine 09/18/2020 NONE DETECTED  NONE DETECTED Final   Benzodiazepines 09/18/2020 NONE DETECTED  NONE DETECTED Final   Amphetamines 09/18/2020 NONE DETECTED  NONE DETECTED Final   Tetrahydrocannabinol 09/18/2020 NONE DETECTED  NONE DETECTED Final   Barbiturates 09/18/2020 POSITIVE (A) NONE DETECTED Final   Comment: (NOTE) DRUG SCREEN FOR MEDICAL PURPOSES ONLY.  IF CONFIRMATION IS NEEDED FOR ANY PURPOSE, NOTIFY LAB WITHIN 5 DAYS.  LOWEST DETECTABLE LIMITS FOR URINE DRUG SCREEN Drug Class                     Cutoff (ng/mL) Amphetamine and metabolites    1000 Barbiturate and metabolites    200 Benzodiazepine                 503 Tricyclics and metabolites     300 Opiates and metabolites        300 Cocaine and metabolites        300 THC                            50 Performed at [facility], [place] [address] [place], [place] [address]    I-stat hCG, quantitative 09/18/2020 <5.0  <5 mIU/mL Final   Comment 3 09/18/2020          Final   Comment:   GEST. AGE      CONC.  (mIU/mL)   <=1 WEEK        5 - 50     2 WEEKS       50 - 500     3 WEEKS       100 - 10,000     4 WEEKS     1,000 - 30,000        FEMALE AND NON-PREGNANT FEMALE:     LESS THAN 5 mIU/mL    Valproic Acid Lvl 09/18/2020 68  50.0 - 100.0 ug/mL Final   Performed at [facility] [address], [place] [address], [place], [place] [address]   Acetaminophen (Tylenol), Serum 09/18/2020 <10 (A) 10 - 30 ug/mL Final   Comment: (NOTE) Therapeutic concentrations vary significantly. A  range of 10-30 ug/mL  may be an effective concentration for many patients. However, some  are best treated at concentrations outside of this range. Acetaminophen concentrations >150 ug/mL at 4 hours after ingestion  and >50 ug/mL at 12 hours after ingestion are often associated with  toxic reactions.  Performed at [facility], [place] [address], [place], [place] [address]    SARS Coronavirus 2 by RT PCR 09/18/2020 NEGATIVE  NEGATIVE Final   Comment: (NOTE) SARS-CoV-2 target nucleic acids are  NOT DETECTED.  The SARS-CoV-2 RNA is generally detectable in upper respiratory specimens during the acute phase of infection. The lowest concentration of SARS-CoV-2 viral copies this assay can detect is 138 copies/mL. A negative result does not preclude SARS-Cov-2 infection and should not be used as the sole basis for treatment or other patient management decisions. A negative result may occur with  improper specimen collection/handling, submission of specimen other than nasopharyngeal swab, presence of viral mutation(s) within the areas targeted by this assay, and inadequate number of viral copies(<138 copies/mL). A negative result must be combined with clinical observations, patient history, and epidemiological information. The expected result is Negative.  Fact Sheet for Patients:  EntrepreneurPulse.com.au  Fact Sheet for Healthcare Providers:  IncredibleEmployment.be  This test is no                          t yet approved or cleared by the [place] FDA and  has been authorized for detection and/or diagnosis of SARS-CoV-2 by FDA under an Emergency Use Authorization (EUA). This EUA will remain  in effect (meaning this test can be used) for the duration of the COVID-19 declaration under Section 564(b)(1) of the Act, 21 U.S.C.section 360bbb-3(b)(1), unless the authorization is terminated  or revoked sooner.       Influenza A by  PCR 09/18/2020 NEGATIVE  NEGATIVE Final   Influenza B by PCR 09/18/2020 NEGATIVE  NEGATIVE Final   Comment: (NOTE) The Xpert Xpress SARS-CoV-2/FLU/RSV plus assay is intended as an aid in the diagnosis of influenza from Nasopharyngeal swab specimens and should not be used as a sole basis for treatment. Nasal washings and aspirates are unacceptable for Xpert Xpress SARS-CoV-2/FLU/RSV testing.  Fact Sheet for Patients: EntrepreneurPulse.com.au  Fact Sheet for Healthcare Providers: IncredibleEmployment.be  This test is not yet approved or cleared by the [place] FDA and has been authorized for detection and/or diagnosis of SARS-CoV-2 by FDA under an Emergency Use Authorization (EUA). This EUA will remain in effect (meaning this test can be used) for the duration of the COVID-19 declaration under Section 564(b)(1) of the Act, 21 U.S.C. section 360bbb-3(b)(1), unless the authorization is terminated or revoked.  Performed at [facility], [place] [address] [place], [place] [address]   Admission on 09/14/2020, Discharged on 09/14/2020  Component Date Value Ref Range Status   Glucose-Capillary 09/14/2020 174 (A) 70 - 99 mg/dL Final   Glucose reference range applies only to samples taken after fasting for at least 8 hours.  Admission on 09/13/2020, Discharged on 09/14/2020  Component Date Value Ref Range Status   Glucose-Capillary 09/13/2020 124 (A) 70 - 99 mg/dL Final   Glucose reference range applies only to samples taken after fasting for at least 8 hours.   SARS Coronavirus 2 by RT PCR 09/13/2020 NEGATIVE  NEGATIVE Final   Comment: (NOTE) SARS-CoV-2 target nucleic acids are NOT DETECTED.  The SARS-CoV-2 RNA is generally detectable in upper respiratory specimens during the acute phase of infection. The lowest concentration of SARS-CoV-2 viral copies this assay can detect is 138 copies/mL. A negative result does not preclude  SARS-Cov-2 infection and should not be used as the sole basis for treatment or other patient management decisions. A negative result may occur with  improper specimen collection/handling, submission of specimen other than nasopharyngeal swab, presence of viral mutation(s) within the areas targeted by this assay, and inadequate number of viral copies(<138 copies/mL). A negative result must be combined with clinical observations,  patient history, and epidemiological information. The expected result is Negative.  Fact Sheet for Patients:  EntrepreneurPulse.com.au  Fact Sheet for Healthcare Providers:  IncredibleEmployment.be  This test is no                          t yet approved or cleared by the [place] FDA and  has been authorized for detection and/or diagnosis of SARS-CoV-2 by FDA under an Emergency Use Authorization (EUA). This EUA will remain  in effect (meaning this test can be used) for the duration of the COVID-19 declaration under Section 564(b)(1) of the Act, 21 U.S.C.section 360bbb-3(b)(1), unless the authorization is terminated  or revoked sooner.       Influenza A by PCR 09/13/2020 NEGATIVE  NEGATIVE Final   Influenza B by PCR 09/13/2020 NEGATIVE  NEGATIVE Final   Comment: (NOTE) The Xpert Xpress SARS-CoV-2/FLU/RSV plus assay is intended as an aid in the diagnosis of influenza from Nasopharyngeal swab specimens and should not be used as a sole basis for treatment. Nasal washings and aspirates are unacceptable for Xpert Xpress SARS-CoV-2/FLU/RSV testing.  Fact Sheet for Patients: EntrepreneurPulse.com.au  Fact Sheet for Healthcare Providers: IncredibleEmployment.be  This test is not yet approved or cleared by the [place] FDA and has been authorized for detection and/or diagnosis of SARS-CoV-2 by FDA under an Emergency Use Authorization (EUA). This EUA will remain in effect  (meaning this test can be used) for the duration of the COVID-19 declaration under Section 564(b)(1) of the Act, 21 U.S.C. section 360bbb-3(b)(1), unless the authorization is terminated or revoked.  Performed at [facility], [place] [address] [place], [place] [address]    Sodium 09/13/2020 140  135 - 145 mmol/L Final   Potassium 09/13/2020 3.8  3.5 - 5.1 mmol/L Final   Chloride 09/13/2020 105  98 - 111 mmol/L Final   CO2 09/13/2020 28  22 - 32 mmol/L Final   Glucose, Bld 09/13/2020 135 (A) 70 - 99 mg/dL Final   Glucose reference range applies only to samples taken after fasting for at least 8 hours.   BUN 09/13/2020 12  6 - 20 mg/dL Final   Creatinine, Ser 09/13/2020 0.89  0.44 - 1.00 mg/dL Final   Calcium 09/13/2020 9.2  8.9 - 10.3 mg/dL Final   Total Protein 09/13/2020 7.4  6.5 - 8.1 g/dL Final   Albumin 09/13/2020 3.8  3.5 - 5.0 g/dL Final   AST 09/13/2020 15  15 - 41 U/L Final   ALT 09/13/2020 11  0 - 44 U/L Final   Alkaline Phosphatase 09/13/2020 48  38 - 126 U/L Final   Total Bilirubin 09/13/2020 0.2 (A) 0.3 - 1.2 mg/dL Final   GFR, Estimated 09/13/2020 >60  >60 mL/min Final   Comment: (NOTE) Calculated using the CKD-EPI Creatinine Equation (2021)    Anion gap 09/13/2020 7  5 - 15 Final   Performed at [facility], [place] [address], [place], [place] [address]   Alcohol, Ethyl (B) 09/13/2020 <10  <10 mg/dL Final   Comment: (NOTE) Lowest detectable limit for serum alcohol is 10 mg/dL.  For medical purposes only. Performed at [facility], [place] [address], [place], [place] [address]    Opiates 09/13/2020 NONE DETECTED  NONE DETECTED Final   Cocaine 09/13/2020 NONE DETECTED  NONE DETECTED Final   Benzodiazepines 09/13/2020 NONE DETECTED  NONE DETECTED Final   Amphetamines 09/13/2020 NONE DETECTED  NONE DETECTED Final  Tetrahydrocannabinol 09/13/2020 NONE DETECTED  NONE DETECTED Final   Barbiturates 09/13/2020 POSITIVE  (A) NONE DETECTED Final   Comment: (NOTE) DRUG SCREEN FOR MEDICAL PURPOSES ONLY.  IF CONFIRMATION IS NEEDED FOR ANY PURPOSE, NOTIFY LAB WITHIN 5 DAYS.  LOWEST DETECTABLE LIMITS FOR URINE DRUG SCREEN Drug Class                     Cutoff (ng/mL) Amphetamine and metabolites    1000 Barbiturate and metabolites    200 Benzodiazepine                 502 Tricyclics and metabolites     300 Opiates and metabolites        300 Cocaine and metabolites        300 THC                            50 Performed at [facility], [place] [address], [place], [place] [address]    WBC 09/13/2020 12.1 (A) 4.0 - 10.5 K/uL Final   RBC 09/13/2020 4.66  3.87 - 5.11 MIL/uL Final   Hemoglobin 09/13/2020 11.3 (A) 12.0 - 15.0 g/dL Final   HCT 09/13/2020 36.1  36.0 - 46.0 % Final   MCV 09/13/2020 77.5 (A) 80.0 - 100.0 fL Final   MCH 09/13/2020 24.2 (A) 26.0 - 34.0 pg Final   MCHC 09/13/2020 31.3  30.0 - 36.0 g/dL Final   RDW 09/13/2020 16.3 (A) 11.5 - 15.5 % Final   Platelets 09/13/2020 184  150 - 400 K/uL Final   nRBC 09/13/2020 0.0  0.0 - 0.2 % Final   Neutrophils Relative % 09/13/2020 70  % Final   Neutro Abs 09/13/2020 8.4 (A) 1.7 - 7.7 K/uL Final   Lymphocytes Relative 09/13/2020 18  % Final   Lymphs Abs 09/13/2020 2.2  0.7 - 4.0 K/uL Final   Monocytes Relative 09/13/2020 10  % Final   Monocytes Absolute 09/13/2020 1.2 (A) 0.1 - 1.0 K/uL Final   Eosinophils Relative 09/13/2020 2  % Final   Eosinophils Absolute 09/13/2020 0.3  0.0 - 0.5 K/uL Final   Basophils Relative 09/13/2020 0  % Final   Basophils Absolute 09/13/2020 0.0  0.0 - 0.1 K/uL Final   Immature Granulocytes 09/13/2020 0  % Final   Abs Immature Granulocytes 09/13/2020 0.04  0.00 - 0.07 K/uL Final   Performed at [facility], [place] [address] [place], [place] [address]   I-stat hCG, quantitative 09/13/2020 <5.0  <5 mIU/mL Final   Comment 3 09/13/2020          Final   Comment:   GEST. AGE      CONC.   (mIU/mL)   <=1 WEEK        5 - 50     2 WEEKS       50 - 500     3 WEEKS       100 - 10,000     4 WEEKS     1,000 - 30,000        FEMALE AND NON-PREGNANT FEMALE:     LESS THAN 5 mIU/mL    Salicylate Lvl 67/20/9470 <7.0 (A) 7.0 - 30.0 mg/dL Final   Performed at [facility], [place] [address] [place], [place] [address]   Acetaminophen (Tylenol), Serum 09/13/2020 <10 (A) 10 - 30 ug/mL Final   Comment: (NOTE) Therapeutic concentrations vary significantly. A range  of 10-30 ug/mL  may be an effective concentration for many patients. However, some  are best treated at concentrations outside of this range. Acetaminophen concentrations >150 ug/mL at 4 hours after ingestion  and >50 ug/mL at 12 hours after ingestion are often associated with  toxic reactions.  Performed at [facility], [place] [address], [place], [place] [address]    Lithium Lvl 09/13/2020 0.31 (A) 0.60 - 1.20 mmol/L Final   Performed at [place] [address] [place], [place] [address]   Valproic Acid Lvl 09/13/2020 53  50.0 - 100.0 ug/mL Final   Performed at [facility], [place] [address], [place], [place] [address]  Admission on 09/11/2020, Discharged on 09/12/2020  Component Date Value Ref Range Status   Glucose-Capillary 09/11/2020 111 (A) 70 - 99 mg/dL Final   Glucose reference range applies only to samples taken after fasting for at least 8 hours.   Sodium 09/11/2020 138  135 - 145 mmol/L Final   Potassium 09/11/2020 4.4  3.5 - 5.1 mmol/L Final   Chloride 09/11/2020 105  98 - 111 mmol/L Final   CO2 09/11/2020 29  22 - 32 mmol/L Final   Glucose, Bld 09/11/2020 118 (A) 70 - 99 mg/dL Final   Glucose reference range applies only to samples taken after fasting for at least 8 hours.   BUN 09/11/2020 10  6 - 20 mg/dL Final   Creatinine, Ser 09/11/2020 0.85  0.44 - 1.00 mg/dL Final   Calcium 09/11/2020 9.2  8.9 - 10.3 mg/dL Final   Total Protein  09/11/2020 7.3  6.5 - 8.1 g/dL Final   Albumin 09/11/2020 3.9  3.5 - 5.0 g/dL Final   AST 09/11/2020 18  15 - 41 U/L Final   ALT 09/11/2020 12  0 - 44 U/L Final   Alkaline Phosphatase 09/11/2020 52  38 - 126 U/L Final   Total Bilirubin 09/11/2020 0.5  0.3 - 1.2 mg/dL Final   GFR, Estimated 09/11/2020 >60  >60 mL/min Final   Comment: (NOTE) Calculated using the CKD-EPI Creatinine Equation (2021)    Anion gap 09/11/2020 4 (A) 5 - 15 Final   Performed at [facility], [place] [address] [place], [place] [address]   Salicylate Lvl 28/41/3244 <7.0 (A) 7.0 - 30.0 mg/dL Final   Performed at [place] [address], [place], [place] [address]   Acetaminophen (Tylenol), Serum 09/11/2020 <10 (A) 10 - 30 ug/mL Final   Comment: (NOTE) Therapeutic concentrations vary significantly. A range of 10-30 ug/mL  may be an effective concentration for many patients. However, some  are best treated at concentrations outside of this range. Acetaminophen concentrations >150 ug/mL at 4 hours after ingestion  and >50 ug/mL at 12 hours after ingestion are often associated with  toxic reactions.  Performed at [facility], [place] [address], [place], [place] [address]    Alcohol, Ethyl (B) 09/11/2020 <10  <10 mg/dL Final   Comment: (NOTE) Lowest detectable limit for serum alcohol is 10 mg/dL.  For medical purposes only. Performed at [facility], [place] [address] [place], [place] [address]    Opiates 09/11/2020 NONE DETECTED  NONE DETECTED Final   Cocaine 09/11/2020 NONE DETECTED  NONE DETECTED Final   Benzodiazepines 09/11/2020 NONE DETECTED  NONE DETECTED Final   Amphetamines 09/11/2020 NONE DETECTED  NONE DETECTED Final   Tetrahydrocannabinol 09/11/2020 NONE DETECTED  NONE DETECTED Final   Barbiturates 09/11/2020 POSITIVE (A) NONE  DETECTED Final   Comment: (NOTE) DRUG SCREEN FOR MEDICAL PURPOSES ONLY.  IF CONFIRMATION IS  NEEDED FOR ANY PURPOSE, NOTIFY LAB WITHIN 5 DAYS.  LOWEST DETECTABLE LIMITS FOR URINE DRUG SCREEN Drug Class                     Cutoff (ng/mL) Amphetamine and metabolites    1000 Barbiturate and metabolites    200 Benzodiazepine                 537 Tricyclics and metabolites     300 Opiates and metabolites        300 Cocaine and metabolites        300 THC                            50 Performed at [facility], [place] [address] [place], [place] [address]    WBC 09/11/2020 7.6  4.0 - 10.5 K/uL Final   RBC 09/11/2020 4.78  3.87 - 5.11 MIL/uL Final   Hemoglobin 09/11/2020 11.6 (A) 12.0 - 15.0 g/dL Final   HCT 09/11/2020 36.5  36.0 - 46.0 % Final   MCV 09/11/2020 76.4 (A) 80.0 - 100.0 fL Final   MCH 09/11/2020 24.3 (A) 26.0 - 34.0 pg Final   MCHC 09/11/2020 31.8  30.0 - 36.0 g/dL Final   RDW 09/11/2020 15.8 (A) 11.5 - 15.5 % Final   Platelets 09/11/2020 194  150 - 400 K/uL Final   nRBC 09/11/2020 0.0  0.0 - 0.2 % Final   Neutrophils Relative % 09/11/2020 50  % Final   Neutro Abs 09/11/2020 3.8  1.7 - 7.7 K/uL Final   Lymphocytes Relative 09/11/2020 38  % Final   Lymphs Abs 09/11/2020 2.9  0.7 - 4.0 K/uL Final   Monocytes Relative 09/11/2020 7  % Final   Monocytes Absolute 09/11/2020 0.5  0.1 - 1.0 K/uL Final   Eosinophils Relative 09/11/2020 5  % Final   Eosinophils Absolute 09/11/2020 0.4  0.0 - 0.5 K/uL Final   Basophils Relative 09/11/2020 0  % Final   Basophils Absolute 09/11/2020 0.0  0.0 - 0.1 K/uL Final   Immature Granulocytes 09/11/2020 0  % Final   Abs Immature Granulocytes 09/11/2020 0.03  0.00 - 0.07 K/uL Final   Performed at [facility], [place] [address], [place], [place] [address]   I-stat hCG, quantitative 09/11/2020 <5.0  <5 mIU/mL Final   Comment 3 09/11/2020          Final   Comment:   GEST. AGE      CONC.  (mIU/mL)   <=1 WEEK        5 - 50     2 WEEKS       50 - 500     3 WEEKS       100 - 10,000     4 WEEKS     1,000 -  30,000        FEMALE AND NON-PREGNANT FEMALE:     LESS THAN 5 mIU/mL    Lithium Lvl 09/11/2020 0.32 (A) 0.60 - 1.20 mmol/L Final   Performed at [facility], [place] [address] [place], [place] [address]   Valproic Acid Lvl 09/11/2020 106 (A) 50.0 - 100.0 ug/mL Final   Performed at [facility] [place] [address], [place], [place] [address]   Magnesium 09/11/2020 2.0  1.7 - 2.4 mg/dL  Final   Performed at [facility], [place] [address] [place], [place] [address]   SARS Coronavirus 2 by RT PCR 09/11/2020 NEGATIVE  NEGATIVE Final   Comment: (NOTE) SARS-CoV-2 target nucleic acids are NOT DETECTED.  The SARS-CoV-2 RNA is generally detectable in upper respiratory specimens during the acute phase of infection. The lowest concentration of SARS-CoV-2 viral copies this assay can detect is 138 copies/mL. A negative result does not preclude SARS-Cov-2 infection and should not be used as the sole basis for treatment or other patient management decisions. A negative result may occur with  improper specimen collection/handling, submission of specimen other than nasopharyngeal swab, presence of viral mutation(s) within the areas targeted by this assay, and inadequate number of viral copies(<138 copies/mL). A negative result must be combined with clinical observations, patient history, and epidemiological information. The expected result is Negative.  Fact Sheet for Patients:  EntrepreneurPulse.com.au  Fact Sheet for Healthcare Providers:  IncredibleEmployment.be  This test is no                          t yet approved or cleared by the [place] FDA and  has been authorized for detection and/or diagnosis of SARS-CoV-2 by FDA under an Emergency Use Authorization (EUA). This EUA will remain  in effect (meaning this test can be used) for the duration of the COVID-19 declaration under Section 564(b)(1) of the  Act, 21 U.S.C.section 360bbb-3(b)(1), unless the authorization is terminated  or revoked sooner.       Influenza A by PCR 09/11/2020 NEGATIVE  NEGATIVE Final   Influenza B by PCR 09/11/2020 NEGATIVE  NEGATIVE Final   Comment: (NOTE) The Xpert Xpress SARS-CoV-2/FLU/RSV plus assay is intended as an aid in the diagnosis of influenza from Nasopharyngeal swab specimens and should not be used as a sole basis for treatment. Nasal washings and aspirates are unacceptable for Xpert Xpress SARS-CoV-2/FLU/RSV testing.  Fact Sheet for Patients: EntrepreneurPulse.com.au  Fact Sheet for Healthcare Providers: IncredibleEmployment.be  This test is not yet approved or cleared by the [place] FDA and has been authorized for detection and/or diagnosis of SARS-CoV-2 by FDA under an Emergency Use Authorization (EUA). This EUA will remain in effect (meaning this test can be used) for the duration of the COVID-19 declaration under Section 564(b)(1) of the Act, 21 U.S.C. section 360bbb-3(b)(1), unless the authorization is terminated or revoked.  Performed at [facility], [place] [address], [place], [place] [address]    Lithium Lvl 09/11/2020 0.59 (A) 0.60 - 1.20 mmol/L Final   Performed at [place] [address] [place], [place] [address]   Valproic Acid Lvl 09/11/2020 88  50.0 - 100.0 ug/mL Final   Performed at [facility], [place] [address], [place], [place] [address]   Lithium Lvl 09/11/2020 0.37 (A) 0.60 - 1.20 mmol/L Final   Performed at [place] [address], [place], [place] [address]   Glucose-Capillary 09/12/2020 120 (A) 70 - 99 mg/dL Final   Glucose reference range applies only to samples taken after fasting for at least 8 hours.  Admission on 09/10/2020, Discharged on 09/11/2020  Component Date Value Ref Range Status   Glucose-Capillary 09/11/2020 216 (A) 70 - 99  mg/dL Final   Glucose reference range applies only to samples taken after fasting for at least 8 hours.   Glucose-Capillary 09/11/2020 120 (A) 70 - 99 mg/dL Final   Glucose reference range applies  only to samples taken after fasting for at least 8 hours.  Admission on 09/10/2020, Discharged on 09/10/2020  Component Date Value Ref Range Status   I-stat hCG, quantitative 09/10/2020 <5.0  <5 mIU/mL Final   Comment 3 09/10/2020          Final   Comment:   GEST. AGE      CONC.  (mIU/mL)   <=1 WEEK        5 - 50     2 WEEKS       50 - 500     3 WEEKS       100 - 10,000     4 WEEKS     1,000 - 30,000        FEMALE AND NON-PREGNANT FEMALE:     LESS THAN 5 mIU/mL    WBC 09/10/2020 7.5  4.0 - 10.5 K/uL Final   RBC 09/10/2020 4.77  3.87 - 5.11 MIL/uL Final   Hemoglobin 09/10/2020 11.5 (A) 12.0 - 15.0 g/dL Final   HCT 09/10/2020 36.5  36.0 - 46.0 % Final   MCV 09/10/2020 76.5 (A) 80.0 - 100.0 fL Final   MCH 09/10/2020 24.1 (A) 26.0 - 34.0 pg Final   MCHC 09/10/2020 31.5  30.0 - 36.0 g/dL Final   RDW 09/10/2020 15.8 (A) 11.5 - 15.5 % Final   Platelets 09/10/2020 204  150 - 400 K/uL Final   nRBC 09/10/2020 0.0  0.0 - 0.2 % Final   Neutrophils Relative % 09/10/2020 49  % Final   Neutro Abs 09/10/2020 3.8  1.7 - 7.7 K/uL Final   Lymphocytes Relative 09/10/2020 39  % Final   Lymphs Abs 09/10/2020 2.9  0.7 - 4.0 K/uL Final   Monocytes Relative 09/10/2020 6  % Final   Monocytes Absolute 09/10/2020 0.4  0.1 - 1.0 K/uL Final   Eosinophils Relative 09/10/2020 5  % Final   Eosinophils Absolute 09/10/2020 0.4  0.0 - 0.5 K/uL Final   Basophils Relative 09/10/2020 1  % Final   Basophils Absolute 09/10/2020 0.0  0.0 - 0.1 K/uL Final   Immature Granulocytes 09/10/2020 0  % Final   Abs Immature Granulocytes 09/10/2020 0.02  0.00 - 0.07 K/uL Final   Performed at [facility], [place] [address] [place], [place] [address]   Sodium 09/10/2020 140  135 - 145 mmol/L Final   Potassium 09/10/2020  3.7  3.5 - 5.1 mmol/L Final   Chloride 09/10/2020 110  98 - 111 mmol/L Final   CO2 09/10/2020 25  22 - 32 mmol/L Final   Glucose, Bld 09/10/2020 123 (A) 70 - 99 mg/dL Final   Glucose reference range applies only to samples taken after fasting for at least 8 hours.   BUN 09/10/2020 10  6 - 20 mg/dL Final   Creatinine, Ser 09/10/2020 0.74  0.44 - 1.00 mg/dL Final   Calcium 09/10/2020 8.9  8.9 - 10.3 mg/dL Final   Total Protein 09/10/2020 7.5  6.5 - 8.1 g/dL Final   Albumin 09/10/2020 4.0  3.5 - 5.0 g/dL Final   AST 09/10/2020 17  15 - 41 U/L Final   ALT 09/10/2020 13  0 - 44 U/L Final   Alkaline Phosphatase 09/10/2020 49  38 - 126 U/L Final   Total Bilirubin 09/10/2020 0.7  0.3 - 1.2 mg/dL Final   GFR, Estimated 09/10/2020 >60  >60 mL/min Final   Comment: (NOTE) Calculated using the CKD-EPI Creatinine Equation (2021)    Anion gap 09/10/2020  5  5 - 15 Final   Performed at [facility] [address] [place], [place] [address]   Opiates 09/10/2020 NONE DETECTED  NONE DETECTED Final   Cocaine 09/10/2020 NONE DETECTED  NONE DETECTED Final   Benzodiazepines 09/10/2020 NONE DETECTED  NONE DETECTED Final   Amphetamines 09/10/2020 NONE DETECTED  NONE DETECTED Final   Tetrahydrocannabinol 09/10/2020 NONE DETECTED  NONE DETECTED Final   Barbiturates 09/10/2020 POSITIVE (A) NONE DETECTED Final   Comment: (NOTE) DRUG SCREEN FOR MEDICAL PURPOSES ONLY.  IF CONFIRMATION IS NEEDED FOR ANY PURPOSE, NOTIFY LAB WITHIN 5 DAYS.  LOWEST DETECTABLE LIMITS FOR URINE DRUG SCREEN Drug Class                     Cutoff (ng/mL) Amphetamine and metabolites    1000 Barbiturate and metabolites    200 Benzodiazepine                 355 Tricyclics and metabolites     300 Opiates and metabolites        300 Cocaine and metabolites        300 THC                            50 Performed at [facility], [place] [address], [place], [place] [address]    SARS Coronavirus 2 by  RT PCR 09/10/2020 NEGATIVE  NEGATIVE Final   Comment: (NOTE) SARS-CoV-2 target nucleic acids are NOT DETECTED.  The SARS-CoV-2 RNA is generally detectable in upper respiratory specimens during the acute phase of infection. The lowest concentration of SARS-CoV-2 viral copies this assay can detect is 138 copies/mL. A negative result does not preclude SARS-Cov-2 infection and should not be used as the sole basis for treatment or other patient management decisions. A negative result may occur with  improper specimen collection/handling, submission of specimen other than nasopharyngeal swab, presence of viral mutation(s) within the areas targeted by this assay, and inadequate number of viral copies(<138 copies/mL). A negative result must be combined with clinical observations, patient history, and epidemiological information. The expected result is Negative.  Fact Sheet for Patients:  EntrepreneurPulse.com.au  Fact Sheet for Healthcare Providers:  IncredibleEmployment.be  This test is no                          t yet approved or cleared by the [place] FDA and  has been authorized for detection and/or diagnosis of SARS-CoV-2 by FDA under an Emergency Use Authorization (EUA). This EUA will remain  in effect (meaning this test can be used) for the duration of the COVID-19 declaration under Section 564(b)(1) of the Act, 21 U.S.C.section 360bbb-3(b)(1), unless the authorization is terminated  or revoked sooner.       Influenza A by PCR 09/10/2020 NEGATIVE  NEGATIVE Final   Influenza B by PCR 09/10/2020 NEGATIVE  NEGATIVE Final   Comment: (NOTE) The Xpert Xpress SARS-CoV-2/FLU/RSV plus assay is intended as an aid in the diagnosis of influenza from Nasopharyngeal swab specimens and should not be used as a sole basis for treatment. Nasal washings and aspirates are unacceptable for Xpert Xpress SARS-CoV-2/FLU/RSV testing.  Fact Sheet for  Patients: EntrepreneurPulse.com.au  Fact Sheet for Healthcare Providers: IncredibleEmployment.be  This test is not yet approved or cleared by the [place] FDA and has been authorized for detection and/or diagnosis of SARS-CoV-2 by FDA under an  Emergency Use Authorization (EUA). This EUA will remain in effect (meaning this test can be used) for the duration of the COVID-19 declaration under Section 564(b)(1) of the Act, 21 U.S.C. section 360bbb-3(b)(1), unless the authorization is terminated or revoked.  Performed at [facility], [place] [address] [place], [place] [address]   Admission on 09/07/2020, Discharged on 09/07/2020  Component Date Value Ref Range Status   Preg Test, Ur 09/07/2020 NEGATIVE  NEGATIVE Final   Comment:        THE SENSITIVITY OF THIS METHODOLOGY IS >24 mIU/mL    Glucose-Capillary 09/07/2020 84  70 - 99 mg/dL Final   Glucose reference range applies only to samples taken after fasting for at least 8 hours.  There may be more visits with results that are not included.    Allergies: Wellbutrin [bupropion], Omnipaque [iohexol], Contrast media [iodinated diagnostic agents], Depakote [divalproex sodium], and Penicillin g  PTA Medications: (Not in a hospital admission)   Medical Decision Making  Admission orders placed  Patient medically cleared in the ED.   Continue home medications -Haldol 10 mg nightly for schizoaffective disorder -Benztropine 1 mg twice daily for EPS -Hydroxyzine 50 mg 3 times daily for anxiety and -Lithium CR 450 mg daily for mood stability -Prazosin 5 mg nightly for nightmares -Simvastatin 20 mg daily for hyperlipidemia -Amlodipine 5 mg daily for hypertension -Levemir 40 units twice daily for DM -Amaryl 4 mg daily for DM -Primidone 50 mg 3 times daily  Monitor CBGs before meals and at bedtime Sliding scale insulin-very sensitive scale  Clinical Course as of 10/18/20 0638   Wed Sep 18, 2020  1722 EKG 12-Lead EKG from 09/18/2020  Vent. rate 83 BPM PR interval 176 ms QRS duration 84 ms QT/QTcB 394/463 ms P-R-T axes 67 58 34 Sinus rhythm Normal ECG [JB]    Clinical Course User Index [JB] Rozetta Nunnery, NP    Recommendations  Based on my evaluation the patient does not appear to have an emergency medical condition.  Rozetta Nunnery, NP 10/18/20  6:38 AM

## 2020-10-17 NOTE — ED Triage Notes (Signed)
Patient became aggressive with staff and upsetting visitors-triage is unsafe, patient is unpredictable

## 2020-10-17 NOTE — ED Notes (Signed)
Pt was brought on the unit and familiarized with the unit. Pt has eaten a salad and ziti and drank diet ginger ale. The patient has been given all of her medications. She has requested something to help her sleep and a shot to help her calm down.

## 2020-10-17 NOTE — ED Notes (Signed)
Patient denies pain and is resting comfortably.  
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
## 2020-10-17 NOTE — ED Notes (Signed)
Pt wanded by security. All items inventoried and stored in [facility]. No lockers available at this time, placed in secure room. Pt has valuables with security
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
## 2020-10-17 NOTE — ED Notes (Signed)
Pt accepted to [facility]. Report given to [facility]. [facility] is transporting pt to accepting facility. Belonging were removed from safe and locker and given to safe transported. Documents provided to transporter as well. Pt ambulated out to the car. No questions at this time.

## 2020-10-17 NOTE — ED Notes (Signed)
Pt belongings sent with [employer]. Pt being transferred to [facility].
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
## 2020-10-17 NOTE — ED Notes (Signed)
Hyper verbal hypomanic with rambling conversation medicated per order.

## 2020-10-18 LAB — GLUCOSE, CAPILLARY: Glucose-Capillary: 93 mg/dL (ref 70–99)

## 2020-10-18 NOTE — Discharge Instructions (Addendum)
Patient is instructed prior to discharge to:  Take all medications as prescribed by his/her mental healthcare provider. Report any adverse effects and or reactions from the medicines to his/her outpatient provider promptly. Keep all scheduled appointments, to ensure that you are getting refills on time and to avoid any interruption in your medication.  If you are unable to keep an appointment call to reschedule.  Be sure to follow-up with resources and follow-up appointments provided.  Patient has been instructed & cautioned: To not engage in alcohol and or illegal drug use while on prescription medicines. In the event of worsening symptoms, patient is instructed to call the crisis hotline, 911 and or go to the nearest ED for appropriate evaluation and treatment of symptoms. To follow-up with his/her primary care provider for your other medical issues, concerns and or health care needs.    

## 2020-10-18 NOTE — ED Notes (Signed)
Resting with eyes closed. Rise and fall of chest noted. No new issues noted. Will continue to monitor for safety

## 2020-10-18 NOTE — Discharge Summary (Signed)
[name] to be D/C'd Home per NP order. Discussed with the patient and all questions fully answered. An After Visit Summary was printed and given to the patient.  Patient escorted out and D/C home via safe transport.  [name]  10/18/2020 10:22 AM

## 2020-10-18 NOTE — ED Notes (Signed)
Pt is currently sleeping, breathing is even and unlabored, environment check has been complete/secure will continue to monitor patient for safety

## 2020-10-18 NOTE — Progress Notes (Addendum)
[facility] LCSW Note  10/18/2020   8:46 AM  Type of Contact and Topic:  ACTT   CSW attempted to contact pt's ACTT with [facility] and the call was sent to the corporate office. CSW was informed that staff are in a morning meeting that will conclude between 9:30am and 10:00am. CSW will make additional attempts to reach ACTT.  Update: CSW attempted to contact ACTT team again. When transferred to the [facility] location, a recording stated, "No one can take your call right now." Will advise pt to follow-up after d/c.  [name] Nay, [place] 10/18/2020  8:46 AM
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
## 2020-11-21 ENCOUNTER — Ambulatory Visit (HOSPITAL_COMMUNITY)
Admission: EM | Admit: 2020-11-21 | Discharge: 2020-11-21 | Disposition: A | Discharge status: Home / Self Care | Payer: Medicaid Other | Attending: Emergency Medicine | Admitting: Emergency Medicine

## 2020-11-21 ENCOUNTER — Other Ambulatory Visit: Payer: Self-pay

## 2020-11-21 ENCOUNTER — Encounter (HOSPITAL_COMMUNITY): Payer: Self-pay | Admitting: *Deleted

## 2020-11-21 DIAGNOSIS — E860 Dehydration: Secondary | ICD-10-CM

## 2020-11-21 DIAGNOSIS — R5383 Other fatigue: Secondary | ICD-10-CM

## 2020-11-21 LAB — CBG MONITORING, ED: Glucose-Capillary: 110 mg/dL — ABNORMAL HIGH (ref 70–99)

## 2020-11-21 NOTE — ED Triage Notes (Signed)
C/o fatigue x4 days without any pain, congestion, fever, or runny nose.  States 4 days ago had emesis x 1.  Requesting she have her CBG and BP checked. Pt states she does not check her CBGs at home.

## 2020-11-21 NOTE — ED Provider Notes (Signed)
[facility]    CSN: [id number] Arrival date & time: 11/21/20  1615      History   Chief Complaint Chief Complaint  Patient presents with   Fatigue    HPI [name] is a 30 y.o. female.   Patient here for evaluation of fatigue that has been ongoing for the past 4 days.  Denies any congestion, fever, sore throat.  Reports that she does not check her blood sugar for blood pressure regularly.  Does have a history of diabetes.  Denies any recent sick contacts.  Reports that she has been spending a lot of time outdoors and does not always drink water.  Denies any trauma, injury, or other precipitating event.  Denies any specific alleviating or aggravating factors.  Denies any fevers, chest pain, shortness of breath, N/V/D, numbness, tingling, weakness, abdominal pain, or headaches.     The history is provided by the patient.   Past Medical History:  Diagnosis Date   Anxiety    Bipolar 1 disorder ([place])    Cognitive deficits    Depression    Diabetes mellitus without complication ([place])    Hypertension    Mental disorder    Mental health disorder    Obesity     Patient Active Problem List   Diagnosis Date Noted   Syphilis 07/15/2020   Malingering 06/05/2020   Gastroesophageal reflux disease 05/04/2020   Hyperglycemia due to type 2 diabetes mellitus ([place]) 05/04/2020   Long term (current) use of insulin ([place]) 05/04/2020   Migraine without aura 05/04/2020   Morbid obesity ([place]) 05/04/2020   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 2 diabetes mellitus ([place]) 05/04/2020   Prolapsed internal hemorrhoids 05/04/2020   Vitamin D deficiency 05/04/2020   Other symptoms and signs involving cognitive functions and awareness 05/04/2020   Suicide attempt ([place])    Anxiety state 03/06/2020   Schizophrenia ([place]) 09/13/2019   Bipolar I disorder, most recent episode depressed ([place]) 06/23/2019   MDD (major depressive disorder) 10/10/2018   Schizoaffective disorder, bipolar type ([place])  09/25/2018   Affective psychosis, bipolar ([place]) 06/13/2018   HTN (hypertension) 05/03/2018   Tobacco use disorder 05/03/2018   Adjustment disorder with emotional disturbance 01/02/2018   Schizophrenia, 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   Moderate bipolar I disorder, most recent episode depressed ([place])    Psychosis ([place])    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/2017   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   OCD (obsessive compulsive disorder) 10/05/2016   Major depressive disorder, recurrent episode, mild ([place]) 05/04/2016   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   Learning disability 07/18/2015   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/2015   Diabetes mellitus ([place]) 07/18/2015   MDD (major depressive disorder), recurrent, severe, 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/2015   Hyperlipidemia 07/18/2015   Severe episode of recurrent major depressive disorder, without psychotic features ([place])    Suicidal ideation    Drug overdose    Cognitive deficits 10/12/2012   Generalized anxiety disorder 06/28/2012    Past Surgical History:  Procedure Laterality Date   CESAREAN SECTION     CESAREAN SECTION N/A 04/25/2013   Procedure: REPEAT CESAREAN SECTION;  Surgeon: [name], MD;  Location: [facility];  Service: Obstetrics;  Laterality: N/A;   MASS EXCISION N/A 06/03/2012   Procedure: EXCISION MASS;  Surgeon: [name], MD;  Location: [facility];  Service: ENT;  Laterality: N/A;  Excision uvula mass   TONSILLECTOMY N/A 06/03/2012   Procedure: TONSILLECTOMY;  Surgeon: [name], MD;  Location:  [facility];  Service: ENT;  Laterality: N/A;   TONSILLECTOMY      OB History     Gravida  3   Para  3   Term  3   Preterm  0   AB  0   Living  3      SAB  0   IAB  0   Ectopic  0   Multiple      Live Births  3            Home Medications    Prior to Admission medications   Medication Sig Start Date End Date Taking?  Authorizing Provider  amLODipine (NORVASC) 5 MG tablet Take 5 mg by mouth daily.   Yes [provider]  cholecalciferol (VITAMIN D) 25 MCG (1000 UNIT) tablet Take 1,000 Units by mouth daily. 04/30/20  Yes [provider]  glimepiride (AMARYL) 4 MG tablet Take 4 mg by mouth daily.   Yes [provider]  haloperidol (HALDOL) 10 MG tablet Take 10 mg by mouth at bedtime. 05/09/20  Yes [provider]  haloperidol decanoate (HALDOL DECANOATE) 100 MG/ML injection Inject 150 mg into the muscle every 28 (twenty-eight) days. Inject '150mg'$  (1.59m) every 28 days.   Yes [provider]  insulin detemir (LEVEMIR) 100 UNIT/ML injection Inject 0.4 mLs (40 Units total) into the skin 2 (two) times daily. 05/05/20  Yes BMontine Circle PA-C  lithium carbonate (ESKALITH) 450 MG CR tablet Take 450 mg by mouth daily.   Yes [provider]  simvastatin (ZOCOR) 20 MG tablet Take 20 mg by mouth daily.   Yes [provider]  vitamin B-12 (CYANOCOBALAMIN) 1000 MCG tablet Take 1,000 mcg by mouth daily. 11/13/19  Yes [provider]  benztropine (COGENTIN) 1 MG tablet Take 1 mg by mouth 2 (two) times daily. 05/01/20   [provider]  divalproex (DEPAKOTE) 500 MG DR tablet Take 2 tablets (1,000 mg total) by mouth every 12 (twelve) hours. Patient not taking: No sig reported 09/11/20   Money, TLowry Ram FNP  hydrOXYzine (ATARAX/VISTARIL) 25 MG tablet Take 1 tablet (25 mg total) by mouth 3 (three) times daily as needed for anxiety. Patient not taking: No sig reported 05/27/20   LDerrill Center NP  hydrOXYzine (VISTARIL) 50 MG capsule Take 50 mg by mouth 3 (three) times daily.    [provider]  nystatin (MYCOSTATIN) 100000 UNIT/ML suspension Use as directed 5 mLs (500,000 Units total) in the mouth or throat 4 (four) times daily. Swish around in your mouth and swallow. Patient not taking: No sig reported 09/24/20   Sponseller, REugene GarnetR, PA-C   omeprazole (PRILOSEC) 20 MG capsule Take 20 mg by mouth daily.    [provider]  ondansetron (ZOFRAN) 4 MG tablet Take 1 tablet (4 mg total) by mouth every 6 (six) hours. 09/07/20   LPeri Jefferson PA-C  prazosin (MINIPRESS) 5 MG capsule Take 5 mg by mouth at bedtime.    [provider]  primidone (MYSOLINE) 50 MG tablet Take 50 mg by mouth 3 (three) times daily.    [provider]    Family History Family History  Problem Relation Age of Onset   Hypertension Mother    Diabetes Father     Social History Social History   Tobacco Use   Smoking status: Every Day    Packs/day: 4.00    Years: 11.00    Pack years: 44.00    Types: Cigarettes, Cigars  Smokeless tobacco: Never   Tobacco comments:    Pt declined  Vaping Use   Vaping Use: Never used  Substance Use Topics   Alcohol use: Not Currently   Drug use: Not Currently    Types: "Crack" cocaine, Other-see comments    Comment: Patient reports hx of smoking Crack     Allergies   Wellbutrin [bupropion], Omnipaque [iohexol], Contrast media [iodinated diagnostic agents], Depakote [divalproex sodium], and Penicillin g   Review of Systems Review of Systems  Constitutional:  Positive for fatigue. Negative for chills and diaphoresis.  HENT:  Negative for congestion and sore throat.   Respiratory:  Negative for cough, choking, chest tightness and shortness of breath.   Cardiovascular:  Negative for chest pain.  All other systems reviewed and are negative.   Physical Exam Triage Vital Signs ED Triage Vitals  Enc Vitals Group     BP 11/21/20 1638 133/85     Pulse Rate 11/21/20 1638 (!) 103     Resp 11/21/20 1638 20     Temp 11/21/20 1638 98.1 F (36.7 C)     Temp Source 11/21/20 1638 Oral     SpO2 11/21/20 1638 97 %     Weight 11/21/20 1639 271 lb (122.9 kg)     Height --      Head Circumference --      Peak Flow --      Pain Score 11/21/20 1639 0     Pain Loc --      Pain Edu? --       Excl. in [place]? --    No data found.  Updated Vital Signs BP 133/85   Pulse (!) 103   Temp 98.1 F (36.7 C) (Oral)   Resp 20   Wt 271 lb (122.9 kg)   LMP 11/14/2020 (Approximate)   SpO2 97%   BMI 42.44 kg/m   Visual Acuity Right Eye Distance:   Left Eye Distance:   Bilateral Distance:    Right Eye Near:   Left Eye Near:    Bilateral Near:     Physical Exam Vitals and nursing note reviewed.  Constitutional:      General: She is not in acute distress.    Appearance: Normal appearance. She is not ill-appearing, toxic-appearing or diaphoretic.  HENT:     Head: Normocephalic and atraumatic.     Nose: Nose normal.     Mouth/Throat:     Mouth: Mucous membranes are moist.     Pharynx: Oropharynx is clear.  Eyes:     Extraocular Movements: Extraocular movements intact.     Conjunctiva/sclera: Conjunctivae normal.     Pupils: Pupils are equal, round, and reactive to light.  Cardiovascular:     Rate and Rhythm: Normal rate and regular rhythm.     Pulses: Normal pulses.     Heart sounds: Normal heart sounds.  Pulmonary:     Effort: Pulmonary effort is normal.     Breath sounds: Normal breath sounds.  Abdominal:     General: Abdomen is flat.  Musculoskeletal:        General: Normal range of motion.     Cervical back: Normal range of motion.  Skin:    General: Skin is warm and dry.  Neurological:     General: No focal deficit present.     Mental Status: She is alert and oriented to person, place, and time.  Psychiatric:        Mood and Affect: Mood normal.  UC Treatments / Results  Labs (all labs ordered are listed, but only abnormal results are displayed) Labs Reviewed  CBG MONITORING, ED - Abnormal; Notable for the following components:      Result Value   Glucose-Capillary 110 (*)    All other components within normal limits    EKG   Radiology No results found.  Procedures Procedures (including critical care time)  Medications Ordered in  UC Medications - No data to display  Initial Impression / Assessment and Plan / UC Course  I have reviewed the triage vital signs and the nursing notes.  Pertinent labs & imaging results that were available during my care of the patient were reviewed by me and considered in my medical decision making (see chart for details).    Assessment negative for red flags or concerns.  CBG 110.  Fatigue versus possible dehydration.  Encourage fluids especially when spending time outside.  Recommend checking blood sugars regularly to ensure that she is not becoming hypoglycemic.  Follow-up with primary care for reevaluation. Final Clinical Impressions(s) / UC Diagnoses   Final diagnoses:  Fatigue, unspecified type  Dehydration     Discharge Instructions      Make sure you are drinking plenty of water especially when spending a lot of time outdoors.    Check your blood sugars regularly.    Follow up with your primary care provider for re-evaluation.       ED Prescriptions   None    PDMP not reviewed this encounter.   Pearson Forster, NP 11/21/20 1715

## 2020-11-21 NOTE — Discharge Instructions (Signed)
Make sure you are drinking plenty of water especially when spending a lot of time outdoors.    Check your blood sugars regularly.    Follow up with your primary care provider for re-evaluation.

## 2020-11-28 ENCOUNTER — Other Ambulatory Visit: Payer: Self-pay

## 2020-11-28 ENCOUNTER — Encounter (HOSPITAL_COMMUNITY): Payer: Self-pay

## 2020-11-28 ENCOUNTER — Ambulatory Visit (HOSPITAL_COMMUNITY)
Admission: EM | Admit: 2020-11-28 | Discharge: 2020-11-28 | Disposition: A | Discharge status: Other Acute Inpatient Hospital | Payer: Medicaid Other | Attending: Student in an Organized Health Care Education/Training Program | Admitting: Student in an Organized Health Care Education/Training Program

## 2020-11-28 ENCOUNTER — Emergency Department (HOSPITAL_COMMUNITY)
Admission: EM | Admit: 2020-11-28 | Discharge: 2020-11-29 | Disposition: A | Discharge status: Home / Self Care | Payer: Medicaid Other | Attending: Emergency Medicine | Admitting: Emergency Medicine

## 2020-11-28 DIAGNOSIS — F250 Schizoaffective disorder, bipolar type: Secondary | ICD-10-CM | POA: Diagnosis present

## 2020-11-28 DIAGNOSIS — Z794 Long term (current) use of insulin: Secondary | ICD-10-CM | POA: Diagnosis not present

## 2020-11-28 DIAGNOSIS — F17210 Nicotine dependence, cigarettes, uncomplicated: Secondary | ICD-10-CM | POA: Diagnosis not present

## 2020-11-28 DIAGNOSIS — R45851 Suicidal ideations: Secondary | ICD-10-CM | POA: Insufficient documentation

## 2020-11-28 DIAGNOSIS — Z7984 Long term (current) use of oral hypoglycemic drugs: Secondary | ICD-10-CM | POA: Diagnosis not present

## 2020-11-28 DIAGNOSIS — I10 Essential (primary) hypertension: Secondary | ICD-10-CM | POA: Insufficient documentation

## 2020-11-28 DIAGNOSIS — Z20822 Contact with and (suspected) exposure to covid-19: Secondary | ICD-10-CM | POA: Diagnosis not present

## 2020-11-28 DIAGNOSIS — Z79899 Other long term (current) drug therapy: Secondary | ICD-10-CM | POA: Diagnosis not present

## 2020-11-28 DIAGNOSIS — R45850 Homicidal ideations: Secondary | ICD-10-CM | POA: Insufficient documentation

## 2020-11-28 DIAGNOSIS — E1142 Type 2 diabetes mellitus with diabetic polyneuropathy: Secondary | ICD-10-CM | POA: Insufficient documentation

## 2020-11-28 LAB — RAPID URINE DRUG SCREEN, HOSP PERFORMED
Amphetamines: NOT DETECTED
Barbiturates: POSITIVE — AB
Benzodiazepines: NOT DETECTED
Cocaine: NOT DETECTED
Opiates: NOT DETECTED
Tetrahydrocannabinol: NOT DETECTED

## 2020-11-28 LAB — COMPREHENSIVE METABOLIC PANEL
ALT: 18 U/L (ref 0–44)
AST: 27 U/L (ref 15–41)
Albumin: 3.8 g/dL (ref 3.5–5.0)
Alkaline Phosphatase: 53 U/L (ref 38–126)
Anion gap: 4 — ABNORMAL LOW (ref 5–15)
BUN: 6 mg/dL (ref 6–20)
CO2: 28 mmol/L (ref 22–32)
Calcium: 9 mg/dL (ref 8.9–10.3)
Chloride: 108 mmol/L (ref 98–111)
Creatinine, Ser: 0.76 mg/dL (ref 0.44–1.00)
GFR, Estimated: >60 mL/min (ref >60)
Glucose, Bld: 119 mg/dL — ABNORMAL HIGH (ref 70–99)
Potassium: 3.8 mmol/L (ref 3.5–5.1)
Sodium: 140 mmol/L (ref 135–145)
Total Bilirubin: 0.6 mg/dL (ref 0.3–1.2)
Total Protein: 7.1 g/dL (ref 6.5–8.1)

## 2020-11-28 LAB — CBC WITH DIFFERENTIAL/PLATELET
Abs Immature Granulocytes: 0.03 10*3/uL (ref 0.00–0.07)
Basophils Absolute: 0.1 10*3/uL (ref 0.0–0.1)
Basophils Relative: 1 %
Eosinophils Absolute: 0.3 10*3/uL (ref 0.0–0.5)
Eosinophils Relative: 3 %
HCT: 35.1 % — ABNORMAL LOW (ref 36.0–46.0)
Hemoglobin: 11.1 g/dL — ABNORMAL LOW (ref 12.0–15.0)
Immature Granulocytes: 0 %
Lymphocytes Relative: 31 %
Lymphs Abs: 3.4 10*3/uL (ref 0.7–4.0)
MCH: 24.7 pg — ABNORMAL LOW (ref 26.0–34.0)
MCHC: 31.6 g/dL (ref 30.0–36.0)
MCV: 78 fL — ABNORMAL LOW (ref 80.0–100.0)
Monocytes Absolute: 0.6 10*3/uL (ref 0.1–1.0)
Monocytes Relative: 5 %
Neutro Abs: 6.6 10*3/uL (ref 1.7–7.7)
Neutrophils Relative %: 60 %
Platelets: 270 10*3/uL (ref 150–400)
RBC: 4.5 MIL/uL (ref 3.87–5.11)
RDW: 17.5 % — ABNORMAL HIGH (ref 11.5–15.5)
WBC: 10.9 10*3/uL — ABNORMAL HIGH (ref 4.0–10.5)
nRBC: 0 % (ref 0.0–0.2)

## 2020-11-28 LAB — I-STAT BETA HCG BLOOD, ED (MC, WL, AP ONLY): I-stat hCG, quantitative: <5 m[IU]/mL (ref <5)

## 2020-11-28 LAB — ETHANOL: Alcohol, Ethyl (B): <10 mg/dL (ref <10)

## 2020-11-28 LAB — RESP PANEL BY RT-PCR (FLU A&B, COVID) ARPGX2
Influenza A by PCR: NEGATIVE
Influenza B by PCR: NEGATIVE
SARS Coronavirus 2 by RT PCR: NEGATIVE

## 2020-11-28 LAB — CBG MONITORING, ED: Glucose-Capillary: 96 mg/dL (ref 70–99)

## 2020-11-28 MED ORDER — BENZTROPINE MESYLATE 1 MG PO TABS
1.0000 mg | ORAL_TABLET | Freq: Two times a day (BID) | ORAL | Status: DC
  Administered 2020-11-29 (×2): 1 mg via ORAL
  Filled 2020-11-28 (×6): qty 1

## 2020-11-28 MED ORDER — ZOLPIDEM TARTRATE 5 MG PO TABS
5.0000 mg | ORAL_TABLET | Freq: Every evening | ORAL | Status: DC | PRN
  Administered 2020-11-29: 5 mg via ORAL
  Filled 2020-11-28: qty 1

## 2020-11-28 MED ORDER — LITHIUM CARBONATE ER 450 MG PO TBCR
450.0000 mg | EXTENDED_RELEASE_TABLET | Freq: Every day | ORAL | Status: DC
  Administered 2020-11-29: 450 mg via ORAL
  Filled 2020-11-28 (×2): qty 1

## 2020-11-28 MED ORDER — ACETAMINOPHEN 325 MG PO TABS: 650.0000 mg | ORAL_TABLET | ORAL | Status: DC | PRN

## 2020-11-28 MED ORDER — VITAMIN D 25 MCG (1000 UNIT) PO TABS
1000.0000 [IU] | ORAL_TABLET | Freq: Every day | ORAL | Status: DC
  Administered 2020-11-29: 1000 [IU] via ORAL
  Filled 2020-11-28: qty 1

## 2020-11-28 MED ORDER — HYDROXYZINE HCL 25 MG PO TABS
25.0000 mg | ORAL_TABLET | Freq: Three times a day (TID) | ORAL | Status: DC | PRN
  Administered 2020-11-28: 25 mg via ORAL
  Filled 2020-11-28: qty 1

## 2020-11-28 MED ORDER — HALOPERIDOL 5 MG PO TABS
10.0000 mg | ORAL_TABLET | Freq: Every day | ORAL | Status: DC
  Administered 2020-11-29: 10 mg via ORAL
  Filled 2020-11-28: qty 2

## 2020-11-28 MED ORDER — PANTOPRAZOLE SODIUM 40 MG PO TBEC
40.0000 mg | DELAYED_RELEASE_TABLET | Freq: Every day | ORAL | Status: DC
  Administered 2020-11-29: 40 mg via ORAL
  Filled 2020-11-28: qty 1

## 2020-11-28 MED ORDER — PRAZOSIN HCL 2 MG PO CAPS
5.0000 mg | ORAL_CAPSULE | Freq: Every day | ORAL | Status: DC
  Administered 2020-11-29: 5 mg via ORAL
  Filled 2020-11-28 (×2): qty 1

## 2020-11-28 MED ORDER — RISPERIDONE 2 MG PO TBDP
2.0000 mg | ORAL_TABLET | Freq: Three times a day (TID) | ORAL | Status: DC | PRN
  Filled 2020-11-28: qty 1

## 2020-11-28 MED ORDER — LITHIUM CARBONATE ER 450 MG PO TBCR
450.0000 mg | EXTENDED_RELEASE_TABLET | Freq: Once | ORAL | Status: AC
  Administered 2020-11-28: 450 mg via ORAL
  Filled 2020-11-28: qty 1

## 2020-11-28 MED ORDER — GLIMEPIRIDE 4 MG PO TABS
4.0000 mg | ORAL_TABLET | Freq: Every day | ORAL | Status: DC
  Administered 2020-11-29: 4 mg via ORAL
  Filled 2020-11-28 (×2): qty 1

## 2020-11-28 MED ORDER — HYDROXYZINE HCL 25 MG PO TABS
50.0000 mg | ORAL_TABLET | Freq: Three times a day (TID) | ORAL | Status: DC
  Administered 2020-11-29 (×2): 50 mg via ORAL
  Filled 2020-11-28 (×2): qty 2

## 2020-11-28 MED ORDER — ONDANSETRON HCL 4 MG PO TABS: 4.0000 mg | ORAL_TABLET | Freq: Three times a day (TID) | ORAL | Status: DC | PRN

## 2020-11-28 MED ORDER — AMLODIPINE BESYLATE 5 MG PO TABS
5.0000 mg | ORAL_TABLET | Freq: Every day | ORAL | Status: DC
  Administered 2020-11-29: 5 mg via ORAL
  Filled 2020-11-28: qty 1

## 2020-11-28 MED ORDER — INSULIN DETEMIR 100 UNIT/ML ~~LOC~~ SOLN
40.0000 [IU] | Freq: Two times a day (BID) | SUBCUTANEOUS | Status: DC
  Filled 2020-11-28 (×4): qty 0.4

## 2020-11-28 MED ORDER — HYDROXYZINE HCL 25 MG PO TABS
25.0000 mg | ORAL_TABLET | Freq: Three times a day (TID) | ORAL | Status: DC | PRN
Start: 2020-11-28 — End: 2020-11-29

## 2020-11-28 MED ORDER — LORAZEPAM 1 MG PO TABS
1.0000 mg | ORAL_TABLET | ORAL | Status: AC | PRN
  Administered 2020-11-29: 1 mg via ORAL
  Filled 2020-11-28: qty 2

## 2020-11-28 MED ORDER — ZIPRASIDONE MESYLATE 20 MG IM SOLR: 20.0000 mg | INTRAMUSCULAR | Status: DC | PRN
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
## 2020-11-28 NOTE — Progress Notes (Signed)
CSW contacted [name] at [phone] to update regarding disposition. ACTT was notified that pt would be going to the [facility]. No other concerns expressed. Contact ended without incident.  Chalmers Guest. Guerry Bruin, MSW, LCSW, [place] 11/28/2020 1:06 PM
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
## 2020-11-28 NOTE — ED Provider Notes (Signed)
Emergency Medicine Provider Triage Evaluation Note  [name] , a 30 y.o. female  was evaluated in triage.  Pt complains of suicidal and homicidal ideations, states that she wants to overdose on her insulin, also notes that she wants to stab people with a knife as she cannot get along with anybody.  States that she has been taking all of her medication as prescribed but states they are not working for her, she denies hallucinations, delusions, denies illicit drug use.  She is not under IVC at this time.  Review of Systems  Positive: SI, HI Negative: Hallucinations, delusions  Physical Exam  BP (!) 151/86   Pulse 75   Temp 98.2 F (36.8 C) (Oral)   Resp 14   Ht '5\' 7"'$  (1.702 m)   Wt 122.9 kg   LMP 11/14/2020 (Approximate)   SpO2 100%   BMI 42.44 kg/m  Gen:   Awake, no distress   Resp:  Normal effort  MSK:   Moves extremities without difficulty  Other:    Medical Decision Making  Medically screening exam initiated at 4:09 PM.  Appropriate orders placed.  Jann Onalee Steinmeyer was informed that the remainder of the evaluation will be completed by another provider, this initial triage assessment does not replace that evaluation, and the importance of remaining in the ED until their evaluation is complete.  Suicidal homicidal ideations patient will remain in triage until she can be placed in a room.  For her safety.   Marcello Fennel, PA-C 11/28/20 1611    Horton, Alvin Critchley, DO 11/29/20 [phone]

## 2020-11-28 NOTE — Discharge Instructions (Signed)
Please continue to take you medications and see your ACTT.

## 2020-11-28 NOTE — ED Notes (Signed)
Discharge instructions provided and Pt stated understanding. Personal belongings returned from locker. Pt alert, orient and ambulatory upon d/c from facility. Pt escorted to the sally port to meet safe transport for transportation services to the [place]. Safety maintained.

## 2020-11-28 NOTE — ED Provider Notes (Addendum)
[facility] EMERGENCY DEPARTMENT Provider Note   CSN: [id number] Arrival date & time: 11/28/20  1521     History Chief Complaint  Patient presents with   Suicidal   Homicidal    [name] is a 30 y.o. female.  HPI    30 year old female with history of diabetes, depression, mental health disorder comes in with chief complaint of SI.  Patient has had both SI and HI and reports that she wanted to overdose on insulin.  She had gone to the [facility] earlier today and assessed by psychiatry team, they wanted her to be observed overnight and advised her to come to the ER.  At the moment, patient is feeling okay.  She does indicate that she has had SI, HI in the recent past, and typically feels that when she is overwhelmed.  Past Medical History:  Diagnosis Date   Anxiety    Bipolar 1 disorder ([place])    Cognitive deficits    Depression    Diabetes mellitus without complication ([place])    Hypertension    Mental disorder    Mental health disorder    Obesity     Patient Active Problem List   Diagnosis Date Noted   Syphilis 07/15/2020   Malingering 06/05/2020   Gastroesophageal reflux disease 05/04/2020   Hyperglycemia due to type 2 diabetes mellitus ([place]) 05/04/2020   Long term (current) use of insulin ([place]) 05/04/2020   Migraine without aura 05/04/2020   Morbid obesity ([place]) 05/04/2020   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 2 diabetes mellitus ([place]) 05/04/2020   Prolapsed internal hemorrhoids 05/04/2020   Vitamin D deficiency 05/04/2020   Other symptoms and signs involving cognitive functions and awareness 05/04/2020   Suicide attempt ([place])    Anxiety state 03/06/2020   Schizophrenia ([place]) 09/13/2019   Bipolar I disorder, most recent episode depressed ([place]) 06/23/2019   MDD (major depressive disorder) 10/10/2018   Schizoaffective disorder, bipolar type ([place]) 09/25/2018   Affective psychosis, bipolar ([place]) 06/13/2018   HTN (hypertension) 05/03/2018   Tobacco use  disorder 05/03/2018   Adjustment disorder with emotional disturbance 01/02/2018   Schizophrenia, 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   Moderate bipolar I disorder, most recent episode depressed ([place])    Psychosis ([place])    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/2017   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   OCD (obsessive compulsive disorder) 10/05/2016   Major depressive disorder, recurrent episode, mild ([place]) 05/04/2016   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   Learning disability 07/18/2015   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/2015   Diabetes mellitus ([place]) 07/18/2015   MDD (major depressive disorder), recurrent, severe, 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   Hyperlipidemia 07/18/2015   Severe episode of recurrent major depressive disorder, without psychotic features ([place])    Suicidal ideation    Drug overdose    Cognitive deficits 10/12/2012   Generalized anxiety disorder 06/28/2012    Past Surgical History:  Procedure Laterality Date   CESAREAN SECTION     CESAREAN SECTION N/A 04/25/2013   Procedure: REPEAT CESAREAN SECTION;  Surgeon: [name], MD;  Location: [facility];  Service: Obstetrics;  Laterality: N/A;   MASS EXCISION N/A 06/03/2012   Procedure: EXCISION MASS;  Surgeon: [name], MD;  Location: [place];  Service: ENT;  Laterality: N/A;  Excision uvula mass   TONSILLECTOMY N/A 06/03/2012   Procedure: TONSILLECTOMY;  Surgeon: [name], MD;  Location: [place];  Service: ENT;  Laterality: N/A;   TONSILLECTOMY  OB History     Gravida  3   Para  3   Term  3   Preterm  0   AB  0   Living  3      SAB  0   IAB  0   Ectopic  0   Multiple      Live Births  3           Family History  Problem Relation Age of Onset   Hypertension Mother    Diabetes Father     Social History   Tobacco Use   Smoking status: Every Day    Packs/day: 4.00    Years: 11.00    Pack years: 44.00     Types: Cigarettes, Cigars   Smokeless tobacco: Never   Tobacco comments:    Pt declined  Vaping Use   Vaping Use: Never used  Substance Use Topics   Alcohol use: Not Currently   Drug use: Not Currently    Types: "Crack" cocaine, Other-see comments    Comment: Patient reports hx of smoking Crack    Home Medications Prior to Admission medications   Medication Sig Start Date End Date Taking? Authorizing Provider  amLODipine (NORVASC) 5 MG tablet Take 5 mg by mouth daily.    [provider]  benztropine (COGENTIN) 1 MG tablet Take 1 mg by mouth 2 (two) times daily. 05/01/20   [provider]  cholecalciferol (VITAMIN D) 25 MCG (1000 UNIT) tablet Take 1,000 Units by mouth daily. 04/30/20   [provider]  glimepiride (AMARYL) 4 MG tablet Take 4 mg by mouth daily.    [provider]  haloperidol (HALDOL) 10 MG tablet Take 10 mg by mouth at bedtime. 05/09/20   [provider]  haloperidol decanoate (HALDOL DECANOATE) 100 MG/ML injection Inject 150 mg into the muscle every 28 (twenty-eight) days. Inject '150mg'$  (1.85m) every 28 days.    [provider]  hydrOXYzine (ATARAX/VISTARIL) 25 MG tablet Take 1 tablet (25 mg total) by mouth 3 (three) times daily as needed for anxiety. 05/27/20   LDerrill Center NP  hydrOXYzine (VISTARIL) 50 MG capsule Take 50 mg by mouth 3 (three) times daily.    [provider]  insulin detemir (LEVEMIR) 100 UNIT/ML injection Inject 0.4 mLs (40 Units total) into the skin 2 (two) times daily. 05/05/20   BMontine Circle PA-C  lithium carbonate (ESKALITH) 450 MG CR tablet Take 450 mg by mouth daily.    [provider]  nystatin (MYCOSTATIN) 100000 UNIT/ML suspension Use as directed 5 mLs (500,000 Units total) in the mouth or throat 4 (four) times daily. Swish around in your mouth and swallow. Patient not taking: No sig reported 09/24/20   Sponseller, REugene GarnetR, PA-C  omeprazole (PRILOSEC) 20 MG capsule  Take 20 mg by mouth daily.    [provider]  ondansetron (ZOFRAN) 4 MG tablet Take 1 tablet (4 mg total) by mouth every 6 (six) hours. 09/07/20   LPeri Jefferson PA-C  prazosin (MINIPRESS) 5 MG capsule Take 5 mg by mouth at bedtime.    [provider]  primidone (MYSOLINE) 50 MG tablet Take 50 mg by mouth 3 (three) times daily.    [provider]  simvastatin (ZOCOR) 20 MG tablet Take 20 mg by mouth daily.    [provider]  vitamin B-12 (CYANOCOBALAMIN) 1000 MCG tablet Take 1,000 mcg by mouth daily. 11/13/19   [provider]  divalproex (DEPAKOTE) 500 MG DR tablet  Take 2 tablets (1,000 mg total) by mouth every 12 (twelve) hours. Patient not taking: No sig reported 09/11/20 11/28/20  Money, Lowry Ram, FNP    Allergies    Wellbutrin [bupropion], Omnipaque [iohexol], Contrast media [iodinated diagnostic agents], Depakote [divalproex sodium], and Penicillin g  Review of Systems   Review of Systems  Constitutional:  Positive for activity change.  Respiratory:  Negative for shortness of breath.   Cardiovascular:  Negative for chest pain.  Psychiatric/Behavioral:  Positive for agitation.   All other systems reviewed and are negative.  Physical Exam Updated Vital Signs BP (!) 151/86   Pulse 75   Temp 98.2 F (36.8 C) (Oral)   Resp 14   Ht '5\' 7"'$  (1.702 m)   Wt 122.9 kg   LMP 11/14/2020 (Approximate)   SpO2 100%   BMI 42.44 kg/m   Physical Exam Vitals and nursing note reviewed.  Constitutional:      Appearance: She is well-developed.  HENT:     Head: Atraumatic.  Cardiovascular:     Rate and Rhythm: Normal rate.  Pulmonary:     Effort: Pulmonary effort is normal.  Musculoskeletal:     Cervical back: Normal range of motion and neck supple.  Skin:    General: Skin is warm and dry.  Neurological:     Mental Status: She is alert and oriented to person, place, and time.     Comments: Abnormal thought content  Psychiatric:        Mood  and Affect: Mood normal.        Behavior: Behavior normal.    ED Results / Procedures / Treatments   Labs (all labs ordered are listed, but only abnormal results are displayed) Labs Reviewed  COMPREHENSIVE METABOLIC PANEL - Abnormal; Notable for the following components:      Result Value   Glucose, Bld 119 (*)    Anion gap 4 (*)    All other components within normal limits  RAPID URINE DRUG SCREEN, HOSP PERFORMED - Abnormal; Notable for the following components:   Barbiturates POSITIVE (*)    All other components within normal limits  CBC WITH DIFFERENTIAL/PLATELET - Abnormal; Notable for the following components:   WBC 10.9 (*)    Hemoglobin 11.1 (*)    HCT 35.1 (*)    MCV 78.0 (*)    MCH 24.7 (*)    RDW 17.5 (*)    All other components within normal limits  RESP PANEL BY RT-PCR (FLU A&B, COVID) ARPGX2  ETHANOL  I-STAT BETA HCG BLOOD, ED ([facility], [facility], [facility] ONLY)  CBG MONITORING, ED    EKG None  Radiology No results found.  Procedures Procedures   Medications Ordered in ED Medications  risperiDONE (RISPERDAL M-TABS) disintegrating tablet 2 mg (has no administration in time range)    And  LORazepam (ATIVAN) tablet 1 mg (has no administration in time range)    And  ziprasidone (GEODON) injection 20 mg (has no administration in time range)  acetaminophen (TYLENOL) tablet 650 mg (has no administration in time range)  zolpidem (AMBIEN) tablet 5 mg (has no administration in time range)  ondansetron (ZOFRAN) tablet 4 mg (has no administration in time range)    ED Course  I have reviewed the triage vital signs and the nursing notes.  Pertinent labs & imaging results that were available during my care of the patient were reviewed by me and considered in my medical decision making (see chart for details).    MDM Rules/Calculators/A&P  30 year old comes in a chief complaint of SI. She has history of bipolar disorder.  Already assessed by [facility] and  they want her to be admitted and observed overnight.  She is medically cleared.   The patient has been placed in psychiatric observation due to the need to provide a safe environment for the patient while obtaining psychiatric consultation and evaluation, as well as ongoing medical and medication management to treat the patient's condition.  The patient has been placed under full IVC at this time.   Final Clinical Impression(s) / ED Diagnoses Final diagnoses:  Suicidal ideations    Rx / DC Orders ED Discharge Orders     None        Varney Biles, MD 11/28/20 [id number]    Varney Biles, MD 11/28/20 [id number]

## 2020-11-28 NOTE — ED Notes (Signed)
Report called to Production assistant, radio at [facility]. Safe transport called for transportation services. Admin lithium and atarax per orders, tolerated well. Safety maintained and will continue to monitor.

## 2020-11-29 DIAGNOSIS — F250 Schizoaffective disorder, bipolar type: Secondary | ICD-10-CM

## 2020-11-29 LAB — CBG MONITORING, ED: Glucose-Capillary: 104 mg/dL — ABNORMAL HIGH (ref 70–99)

## 2020-11-29 NOTE — Social Work (Signed)
CSW contacted ACTT team office waiting for call back form team members.

## 2020-11-29 NOTE — ED Notes (Signed)
Report received at this time.  Patient currently resting on bed with eyes closed.  Respirations even and unlabored.

## 2020-11-29 NOTE — ED Notes (Addendum)
Pt currently resting. D/t pt manic-type behavior prior to falling sleep, will administer medications when pt awakens.

## 2020-11-29 NOTE — ED Notes (Signed)
Pt is now awake and alert. Talking to psych by the nursing station at this time. Pt is wanting help as she states she needs her meds adjusted. She does not want discharge. Safety precautions maintained.

## 2020-11-29 NOTE — ED Notes (Signed)
Pt was given taxi voucher at this time by SW. Cleared for discharge with follow up to ACT team.

## 2020-11-29 NOTE — ED Notes (Signed)
Medications requested from pharmacy at this time 

## 2020-11-29 NOTE — Discharge Instructions (Signed)
For your behavioral health needs you are advised to continue treatment with the [facility] Team:       [facility]      [address]      [place], [place] [address]      Office: [phone]      Crisis: [phone]

## 2020-11-29 NOTE — ED Notes (Signed)
Lunch Ordered °
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
## 2020-11-29 NOTE — Progress Notes (Signed)
Unable to connect with ACTT team for transportation.  CSW provided taxi voucher for d/c transport needs.

## 2020-11-29 NOTE — ED Notes (Signed)
Attempted to wake patient for tele-assessment.  Patient sleeping and snoring.  Unable to wake patient for assessment.  Will continue to monitor and attempt to wake again shortly

## 2020-11-29 NOTE — ED Notes (Signed)
Scheduled AM meds were rescheduled at this time as pt is sleeping. Attempted by previous RN to wake pt up for TTS with no success. Will continue to monitor.

## 2020-12-06 ENCOUNTER — Ambulatory Visit (HOSPITAL_COMMUNITY)
Admission: EM | Admit: 2020-12-06 | Discharge: 2020-12-06 | Disposition: A | Discharge status: Home / Self Care | Payer: Medicaid Other | Attending: Student in an Organized Health Care Education/Training Program | Admitting: Student in an Organized Health Care Education/Training Program

## 2020-12-06 ENCOUNTER — Other Ambulatory Visit: Payer: Self-pay

## 2020-12-06 DIAGNOSIS — Z9114 Patient's other noncompliance with medication regimen: Secondary | ICD-10-CM | POA: Insufficient documentation

## 2020-12-06 DIAGNOSIS — F250 Schizoaffective disorder, bipolar type: Secondary | ICD-10-CM | POA: Diagnosis not present

## 2020-12-06 DIAGNOSIS — R45851 Suicidal ideations: Secondary | ICD-10-CM | POA: Diagnosis not present

## 2020-12-06 DIAGNOSIS — F411 Generalized anxiety disorder: Secondary | ICD-10-CM | POA: Diagnosis not present

## 2020-12-06 MED ORDER — HYDROXYZINE HCL 25 MG PO TABS: 25.0000 mg | ORAL_TABLET | Freq: Once | ORAL | Status: AC

## 2020-12-06 MED ORDER — HYDROXYZINE HCL 25 MG PO TABS
ORAL_TABLET | ORAL | Status: AC
  Administered 2020-12-06: 25 mg via ORAL
  Filled 2020-12-06: qty 1

## 2020-12-06 NOTE — ED Notes (Addendum)
Per request of [place], MD, pt given 1 x dose, Vistaril 25 mg.

## 2020-12-06 NOTE — Discharge Instructions (Addendum)
Please continue to follow up with ACTT.
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
## 2020-12-11 ENCOUNTER — Encounter (HOSPITAL_COMMUNITY): Payer: Self-pay

## 2020-12-11 ENCOUNTER — Ambulatory Visit (HOSPITAL_COMMUNITY)
Admission: EM | Admit: 2020-12-11 | Discharge: 2020-12-11 | Disposition: A | Discharge status: Home / Self Care | Payer: Medicaid Other | Attending: Student | Admitting: Student

## 2020-12-11 ENCOUNTER — Other Ambulatory Visit: Payer: Self-pay

## 2020-12-11 DIAGNOSIS — E11638 Type 2 diabetes mellitus with other oral complications: Secondary | ICD-10-CM

## 2020-12-11 DIAGNOSIS — Z794 Long term (current) use of insulin: Secondary | ICD-10-CM

## 2020-12-11 DIAGNOSIS — B370 Candidal stomatitis: Secondary | ICD-10-CM

## 2020-12-11 LAB — CBG MONITORING, ED: Glucose-Capillary: 124 mg/dL — ABNORMAL HIGH (ref 70–99)

## 2020-12-11 MED ORDER — NYSTATIN 100000 UNIT/ML MT SUSP: 500000.0000 [IU] | Freq: Four times a day (QID) | OROMUCOSAL | 0 refills | Status: AC

## 2020-12-11 NOTE — Discharge Instructions (Addendum)
-  Nystatin suspension -Come back and see [place] if symptoms are getting worse instead of better, like trouble swallowing, sore throat.

## 2020-12-11 NOTE — ED Triage Notes (Signed)
Pt reports, she has a yeast infection in the mouth x 1 week. States her mouth looks funny and feels funny.

## 2020-12-11 NOTE — ED Provider Notes (Signed)
[facility]    CSN: [id number] Arrival date & time: 12/11/20  1616      History   Chief Complaint Chief Complaint  Patient presents with   mouth problem    HPI [name] is a 30 y.o. female presenting with concern for yeast infection in the mouth for about 1 week.  Many recent ED and urgent care visits. Medical history oral thrush, schizoaffective disorder, bipolar 1, cognitive deficits, obesity.  Notes white spots in mouth and mouth irritation for about 1 week, consistent with oral thrush.  She was last diagnosed with this 08/2020. She is a diabetic, states she is taking her current regimen as directed.  HPI  Past Medical History:  Diagnosis Date   Anxiety    Bipolar 1 disorder ([place])    Cognitive deficits    Depression    Diabetes mellitus without complication ([place])    Hypertension    Mental disorder    Mental health disorder    Obesity     Patient Active Problem List   Diagnosis Date Noted   Syphilis 07/15/2020   Malingering 06/05/2020   Gastroesophageal reflux disease 05/04/2020   Hyperglycemia due to type 2 diabetes mellitus ([place]) 05/04/2020   Long term (current) use of insulin ([place]) 05/04/2020   Migraine without aura 05/04/2020   Morbid obesity ([place]) 05/04/2020   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 2 diabetes mellitus ([place]) 05/04/2020   Prolapsed internal hemorrhoids 05/04/2020   Vitamin D deficiency 05/04/2020   Other symptoms and signs involving cognitive functions and awareness 05/04/2020   Suicide attempt ([place])    Anxiety state 03/06/2020   Schizophrenia ([place]) 09/13/2019   Bipolar I disorder, most recent episode depressed ([place]) 06/23/2019   MDD (major depressive disorder) 10/10/2018   Schizoaffective disorder, bipolar type ([place]) 09/25/2018   Affective psychosis, bipolar ([place]) 06/13/2018   HTN (hypertension) 05/03/2018   Tobacco use disorder 05/03/2018   Adjustment disorder with emotional disturbance 01/02/2018   Schizophrenia, 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   Moderate bipolar I disorder, most recent episode depressed ([place])    Psychosis ([place])    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/2017   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   OCD (obsessive compulsive disorder) 10/05/2016   Major depressive disorder, recurrent episode, mild ([place]) 05/04/2016   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   Learning disability 07/18/2015   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/2015   Diabetes mellitus ([place]) 07/18/2015   MDD (major depressive disorder), recurrent, severe, 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   Hyperlipidemia 07/18/2015   Severe episode of recurrent major depressive disorder, without psychotic features ([place])    Suicidal ideation    Drug overdose    Cognitive deficits 10/12/2012   Generalized anxiety disorder 06/28/2012    Past Surgical History:  Procedure Laterality Date   CESAREAN SECTION     CESAREAN SECTION N/A 04/25/2013   Procedure: REPEAT CESAREAN SECTION;  Surgeon: [name], MD;  Location: [facility];  Service: Obstetrics;  Laterality: N/A;   MASS EXCISION N/A 06/03/2012   Procedure: EXCISION MASS;  Surgeon: [name], MD;  Location: [facility];  Service: ENT;  Laterality: N/A;  Excision uvula mass   TONSILLECTOMY N/A 06/03/2012   Procedure: TONSILLECTOMY;  Surgeon: [name], MD;  Location: [facility];  Service: ENT;  Laterality: N/A;   TONSILLECTOMY      OB History     Gravida  3   Para  3   Term  3   Preterm  0  AB  0   Living  3      SAB  0   IAB  0   Ectopic  0   Multiple      Live Births  3            Home Medications    Prior to Admission medications   Medication Sig Start Date End Date Taking? Authorizing Provider  acetaminophen (TYLENOL) 500 MG tablet Take 1,000 mg by mouth every 6 (six) hours as needed for mild pain or headache.    [provider]  amLODipine (NORVASC) 5 MG tablet Take 5 mg by mouth daily.     [provider]  benztropine (COGENTIN) 1 MG tablet Take 1 mg by mouth 2 (two) times daily. 05/01/20   [provider]  cholecalciferol (VITAMIN D) 25 MCG (1000 UNIT) tablet Take 1,000 Units by mouth daily. 04/30/20   [provider]  divalproex (DEPAKOTE ER) 500 MG 24 hr tablet Take 1,000 mg by mouth in the morning and at bedtime.    [provider]  glimepiride (AMARYL) 4 MG tablet Take 4 mg by mouth daily.    [provider]  haloperidol (HALDOL) 10 MG tablet Take 10-20 mg by mouth 2 (two) times daily. '10mg'$  daily and '20mg'$  at bedtime. 05/09/20   [provider]  haloperidol decanoate (HALDOL DECANOATE) 100 MG/ML injection Inject 150 mg into the muscle every 28 (twenty-eight) days. Inject '150mg'$  (1.55m) every 28 days.    [provider]  hydrOXYzine (ATARAX/VISTARIL) 50 MG tablet Take 50 mg by mouth at bedtime as needed for anxiety.    [provider]  insulin detemir (LEVEMIR) 100 UNIT/ML injection Inject 0.4 mLs (40 Units total) into the skin 2 (two) times daily. 05/05/20   BMontine Circle PA-C  lithium carbonate (ESKALITH) 450 MG CR tablet Take 450 mg by mouth daily.    [provider]  nystatin (MYCOSTATIN) 100000 UNIT/ML suspension Use as directed 5 mLs (500,000 Units total) in the mouth or throat 4 (four) times daily for 7 days. Swish around in your mouth and swallow for 7 days or while symptoms persist 12/11/20 12/18/20  GHazel Sams PA-C  omeprazole (PRILOSEC) 20 MG capsule Take 20 mg by mouth daily.    [provider]  ondansetron (ZOFRAN) 4 MG tablet Take 1 tablet (4 mg total) by mouth every 6 (six) hours. 09/07/20   LPeri Jefferson PA-C  prazosin (MINIPRESS) 5 MG capsule Take 5 mg by mouth at bedtime.    [provider]  primidone (MYSOLINE) 50 MG tablet Take 100 mg by mouth 3 (three) times daily.    [provider]  simvastatin (ZOCOR) 20 MG tablet Take 20 mg by mouth daily. Patient  not taking: Reported on 11/29/2020    [provider]  vitamin B-12 (CYANOCOBALAMIN) 1000 MCG tablet Take 1,000 mcg by mouth daily. Patient not taking: Reported on 11/29/2020 11/13/19   [provider]    Family History Family History  Problem Relation Age of Onset   Hypertension Mother    Diabetes Father     Social History Social History   Tobacco Use   Smoking status: Every Day    Packs/day: 4.00    Years: 11.00    Pack years: 44.00    Types: Cigarettes, Cigars   Smokeless tobacco: Never   Tobacco comments:    Pt declined  Vaping Use   Vaping Use: Never used  Substance Use Topics   Alcohol use:  Not Currently   Drug use: Not Currently    Types: "Crack" cocaine, Other-see comments    Comment: Patient reports hx of smoking Crack     Allergies   Wellbutrin [bupropion], Omnipaque [iohexol], Contrast media [iodinated diagnostic agents], Depakote [divalproex sodium], Hydroxyzine, and Penicillin g   Review of Systems Review of Systems  HENT:  Positive for mouth sores.   All other systems reviewed and are negative.   Physical Exam Triage Vital Signs ED Triage Vitals  Enc Vitals Group     BP 12/11/20 1750 (!) 139/91     Pulse Rate 12/11/20 1749 96     Resp 12/11/20 1749 (!) 23     Temp 12/11/20 1749 98.6 F (37 C)     Temp Source 12/11/20 1749 Oral     SpO2 12/11/20 1749 98 %     Weight --      Height --      Head Circumference --      Peak Flow --      Pain Score 12/11/20 1748 0     Pain Loc --      Pain Edu? --      Excl. in [place]? --    No data found.  Updated Vital Signs BP (!) 139/91 (BP Location: Left Arm)   Pulse 96   Temp 98.6 F (37 C) (Oral)   Resp (!) 23   LMP 11/14/2020 (Approximate)   SpO2 98%   Visual Acuity Right Eye Distance:   Left Eye Distance:   Bilateral Distance:    Right Eye Near:   Left Eye Near:    Bilateral Near:     Physical Exam Vitals reviewed.  Constitutional:      General: She is not in acute  distress.    Appearance: Normal appearance. She is not ill-appearing or diaphoretic.  HENT:     Head: Normocephalic and atraumatic.     Mouth/Throat:     Comments: Few white patches in mouth. Scrape off easily revealing friable oral mucosa.   On exam, uvula is midline, she is tolerating her secretions without difficulty, there is no trismus, no drooling, she has normal phonation  Cardiovascular:     Rate and Rhythm: Normal rate and regular rhythm.     Heart sounds: Normal heart sounds.  Pulmonary:     Effort: Pulmonary effort is normal.     Breath sounds: Normal breath sounds.  Skin:    General: Skin is warm.  Neurological:     General: No focal deficit present.     Mental Status: She is alert and oriented to person, place, and time.  Psychiatric:        Mood and Affect: Mood normal.        Behavior: Behavior normal.        Thought Content: Thought content normal.        Judgment: Judgment normal.     UC Treatments / Results  Labs (all labs ordered are listed, but only abnormal results are displayed) Labs Reviewed  CBG MONITORING, ED - Abnormal; Notable for the following components:      Result Value   Glucose-Capillary 124 (*)    All other components within normal limits    EKG   Radiology No results found.  Procedures Procedures (including critical care time)  Medications Ordered in UC Medications - No data to display  Initial Impression / Assessment and Plan / UC Course  I have reviewed the triage vital signs and the  nursing notes.  Pertinent labs & imaging results that were available during my care of the patient were reviewed by me and considered in my medical decision making (see chart for details).     This patient is a very pleasant 30 y.o. year old female presenting with oral thrush. She is a diabetic. Last diagnosed with this 08/2020. Nystatin sent as below. Continue current regimen for diabetes. ED return precautions discussed. Patient verbalizes  understanding and agreement.  .   Final Clinical Impressions(s) / UC Diagnoses   Final diagnoses:  Oral thrush  Type 2 diabetes mellitus with other oral complication, with long-term current use of insulin (HCC)     Discharge Instructions      -Nystatin suspension -Come back and see [place] if symptoms are getting worse instead of better, like trouble swallowing, sore throat.     ED Prescriptions     Medication Sig Dispense Auth. Provider   nystatin (MYCOSTATIN) 100000 UNIT/ML suspension Use as directed 5 mLs (500,000 Units total) in the mouth or throat 4 (four) times daily for 7 days. Swish around in your mouth and swallow for 7 days or while symptoms persist 60 mL Hazel Sams, PA-C      PDMP not reviewed this encounter.   Hazel Sams, PA-C 12/11/20 [place]

## 2020-12-31 ENCOUNTER — Ambulatory Visit (HOSPITAL_COMMUNITY)
Admission: EM | Admit: 2020-12-31 | Discharge: 2020-12-31 | Disposition: A | Discharge status: Home / Self Care | Payer: Medicaid Other | Attending: Internal Medicine | Admitting: Internal Medicine

## 2020-12-31 ENCOUNTER — Encounter (HOSPITAL_COMMUNITY): Payer: Self-pay | Admitting: Emergency Medicine

## 2020-12-31 ENCOUNTER — Other Ambulatory Visit: Payer: Self-pay

## 2020-12-31 DIAGNOSIS — N898 Other specified noninflammatory disorders of vagina: Secondary | ICD-10-CM | POA: Diagnosis present

## 2020-12-31 DIAGNOSIS — R300 Dysuria: Secondary | ICD-10-CM | POA: Insufficient documentation

## 2020-12-31 DIAGNOSIS — E1165 Type 2 diabetes mellitus with hyperglycemia: Secondary | ICD-10-CM

## 2020-12-31 LAB — POCT URINALYSIS DIPSTICK, ED / UC
Bilirubin Urine: NEGATIVE
Glucose, UA: NEGATIVE mg/dL
Hgb urine dipstick: NEGATIVE
Leukocytes,Ua: NEGATIVE
Nitrite: NEGATIVE
Protein, ur: NEGATIVE mg/dL
Specific Gravity, Urine: 1.02 (ref 1.005–1.030)
Urobilinogen, UA: 0.2 mg/dL (ref 0.0–1.0)
pH: 6.5 (ref 5.0–8.0)

## 2020-12-31 LAB — CBG MONITORING, ED: Glucose-Capillary: 100 mg/dL — ABNORMAL HIGH (ref 70–99)

## 2020-12-31 NOTE — ED Provider Notes (Addendum)
[name]    CSN: [id number] Arrival date & time: 12/31/20  1616      History   Chief Complaint Chief Complaint  Patient presents with   Vaginal Discharge    HPI [name] is a 30 y.o. female.   Vaginal Discharge Discharge started 4 days ago Discharge appears thick white, cottage cheese like She denies vaginal odors She endorses vaginal pruritis and dysuria, denies abnormal vaginal bleeding, hematuria, pelvic pain, nausea, vomiting, fevers, flank pain She would like STD testing Patient's last menstrual period was 12/17/2020. Denies chance of pregnancy. She does not douche. Desires HIV/RPR: none    She request that her CBG is checked today because she does not have a glucometer at home and has forgotten to ask her PCP for a new one    Past Medical History:  Diagnosis Date   Anxiety    Bipolar 1 disorder ([place])    Cognitive deficits    Depression    Diabetes mellitus without complication ([place])    Hypertension    Mental disorder    Mental health disorder    Obesity     Patient Active Problem List   Diagnosis Date Noted   Syphilis 07/15/2020   Malingering 06/05/2020   Gastroesophageal reflux disease 05/04/2020   Hyperglycemia due to type 2 diabetes mellitus ([place]) 05/04/2020   Long term (current) use of insulin ([place]) 05/04/2020   Migraine without aura 05/04/2020   Morbid obesity ([place]) 05/04/2020   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 2 diabetes mellitus ([place]) 05/04/2020   Prolapsed internal hemorrhoids 05/04/2020   Vitamin D deficiency 05/04/2020   Other symptoms and signs involving cognitive functions and awareness 05/04/2020   Suicide attempt ([place])    Anxiety state 03/06/2020   Schizophrenia ([place]) 09/13/2019   Bipolar I disorder, most recent episode depressed ([place]) 06/23/2019   MDD (major depressive disorder) 10/10/2018   Schizoaffective disorder, bipolar type ([place]) 09/25/2018   Affective psychosis, bipolar ([place]) 06/13/2018   HTN (hypertension)  05/03/2018   Tobacco use disorder 05/03/2018   Adjustment disorder with emotional disturbance 01/02/2018   Schizophrenia, 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   Moderate bipolar I disorder, most recent episode depressed ([place])    Psychosis ([place])    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/2017   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   OCD (obsessive compulsive disorder) 10/05/2016   Major depressive disorder, recurrent episode, mild ([place]) 05/04/2016   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   Learning disability 07/18/2015   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/2015   Diabetes mellitus ([place]) 07/18/2015   MDD (major depressive disorder), recurrent, severe, 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   Hyperlipidemia 07/18/2015   Severe episode of recurrent major depressive disorder, without psychotic features ([place])    Suicidal ideation    Drug overdose    Cognitive deficits 10/12/2012   Generalized anxiety disorder 06/28/2012    Past Surgical History:  Procedure Laterality Date   CESAREAN SECTION     CESAREAN SECTION N/A 04/25/2013   Procedure: REPEAT CESAREAN SECTION;  Surgeon: [name], MD;  Location: [facility];  Service: Obstetrics;  Laterality: N/A;   MASS EXCISION N/A 06/03/2012   Procedure: EXCISION MASS;  Surgeon: [name], MD;  Location: [facility];  Service: ENT;  Laterality: N/A;  Excision uvula mass   TONSILLECTOMY N/A 06/03/2012   Procedure: TONSILLECTOMY;  Surgeon: [name], MD;  Location: [facility];  Service: ENT;  Laterality: N/A;   TONSILLECTOMY      OB History  Gravida  3   Para  3   Term  3   Preterm  0   AB  0   Living  3      SAB  0   IAB  0   Ectopic  0   Multiple      Live Births  3            Home Medications    Prior to Admission medications   Medication Sig Start Date End Date Taking? Authorizing Provider  acetaminophen (TYLENOL) 500 MG tablet Take 1,000 mg by mouth every 6  (six) hours as needed for mild pain or headache.    [provider]  amLODipine (NORVASC) 5 MG tablet Take 5 mg by mouth daily.    [provider]  benztropine (COGENTIN) 1 MG tablet Take 1 mg by mouth 2 (two) times daily. 05/01/20   [provider]  cholecalciferol (VITAMIN D) 25 MCG (1000 UNIT) tablet Take 1,000 Units by mouth daily. 04/30/20   [provider]  divalproex (DEPAKOTE ER) 500 MG 24 hr tablet Take 1,000 mg by mouth in the morning and at bedtime.    [provider]  glimepiride (AMARYL) 4 MG tablet Take 4 mg by mouth daily.    [provider]  haloperidol (HALDOL) 10 MG tablet Take 10-20 mg by mouth 2 (two) times daily. '10mg'$  daily and '20mg'$  at bedtime. 05/09/20   [provider]  haloperidol decanoate (HALDOL DECANOATE) 100 MG/ML injection Inject 150 mg into the muscle every 28 (twenty-eight) days. Inject '150mg'$  (1.33m) every 28 days.    [provider]  hydrOXYzine (ATARAX/VISTARIL) 50 MG tablet Take 50 mg by mouth at bedtime as needed for anxiety.    [provider]  insulin detemir (LEVEMIR) 100 UNIT/ML injection Inject 0.4 mLs (40 Units total) into the skin 2 (two) times daily. 05/05/20   BMontine Circle PA-C  lithium carbonate (ESKALITH) 450 MG CR tablet Take 450 mg by mouth daily.    [provider]  omeprazole (PRILOSEC) 20 MG capsule Take 20 mg by mouth daily.    [provider]  ondansetron (ZOFRAN) 4 MG tablet Take 1 tablet (4 mg total) by mouth every 6 (six) hours. 09/07/20   LPeri Jefferson PA-C  prazosin (MINIPRESS) 5 MG capsule Take 5 mg by mouth at bedtime.    [provider]  primidone (MYSOLINE) 50 MG tablet Take 100 mg by mouth 3 (three) times daily.    [provider]  simvastatin (ZOCOR) 20 MG tablet Take 20 mg by mouth daily. Patient not taking: Reported on 11/29/2020    [provider]  vitamin B-12 (CYANOCOBALAMIN) 1000 MCG tablet Take 1,000 mcg by  mouth daily. Patient not taking: Reported on 11/29/2020 11/13/19   [provider]    Family History Family History  Problem Relation Age of Onset   Hypertension Mother    Diabetes Father     Social History Social History   Tobacco Use   Smoking status: Every Day    Packs/day: 4.00    Years: 11.00    Pack years: 44.00    Types: Cigarettes, Cigars   Smokeless tobacco: Never   Tobacco comments:    Pt declined  Vaping Use   Vaping Use: Never used  Substance Use Topics   Alcohol use: Not Currently   Drug use: Not Currently    Types: "Crack" cocaine, Other-see comments    Comment: Patient reports hx of smoking Crack  Allergies   Wellbutrin [bupropion], Omnipaque [iohexol], Contrast media [iodinated diagnostic agents], Depakote [divalproex sodium], Hydroxyzine, and Penicillin g   Review of Systems Review of Systems  All other systems reviewed and are negative. Per HPI  Physical Exam Triage Vital Signs ED Triage Vitals  Enc Vitals Group     BP 12/31/20 1734 137/90     Pulse Rate 12/31/20 1734 88     Resp 12/31/20 1734 18     Temp 12/31/20 1734 98.8 F (37.1 C)     Temp Source 12/31/20 1734 Oral     SpO2 12/31/20 1734 97 %     Weight --      Height --      Head Circumference --      Peak Flow --      Pain Score 12/31/20 1756 8     Pain Loc --      Pain Edu? --      Excl. in [place]? --    No data found.  Updated Vital Signs BP 137/90   Pulse 88   Temp 98.8 F (37.1 C) (Oral)   Resp 18   LMP 12/17/2020   SpO2 97%   Visual Acuity Right Eye Distance:   Left Eye Distance:   Bilateral Distance:    Right Eye Near:   Left Eye Near:    Bilateral Near:     Physical Exam Constitutional:      General: She is not in acute distress.    Appearance: Normal appearance. She is not ill-appearing or toxic-appearing.  HENT:     Head: Normocephalic and atraumatic.  Cardiovascular:     Rate and Rhythm: Normal rate.  Pulmonary:     Effort: Pulmonary  effort is normal.     Breath sounds: Normal breath sounds.  Abdominal:     General: Abdomen is flat.     Palpations: Abdomen is soft.     Tenderness: There is no abdominal tenderness. There is no right CVA tenderness, left CVA tenderness or guarding.  Skin:    General: Skin is warm and dry.  Neurological:     General: No focal deficit present.     Mental Status: She is alert and oriented to person, place, and time.  Psychiatric:        Mood and Affect: Mood normal.        Behavior: Behavior normal.        Thought Content: Thought content normal.        Judgment: Judgment normal.     UC Treatments / Results  Labs (all labs ordered are listed, but only abnormal results are displayed) Labs Reviewed  CBG MONITORING, ED - Abnormal; Notable for the following components:      Result Value   Glucose-Capillary 100 (*)    All other components within normal limits  POCT URINALYSIS DIPSTICK, ED / UC - Abnormal; Notable for the following components:   Ketones, ur TRACE (*)    All other components within normal limits  URINE CULTURE  URINALYSIS, DIPSTICK ONLY  CERVICOVAGINAL ANCILLARY ONLY    EKG   Radiology No results found.  Procedures Procedures (including critical care time)  Medications Ordered in UC Medications - No data to display  Initial Impression / Assessment and Plan / UC Course  I have reviewed the triage vital signs and the nursing notes.  Pertinent labs & imaging results that were available during my care of the patient were reviewed by me and considered in my medical  decision making (see chart for details).     Patient is a 30 year old female with past medical history significant for bipolar disorder, type 2 diabetes, hypertension, schizoaffective disorder, who presents with 4 days of vaginal discharge and itching with some dysuria.  Her vital signs are stable and her exam is otherwise reassuring, she does not have any abdominal tenderness.  No concern for  pregnancy per patient's report and her LMP is appropriate.  Vaginal swab obtained and will treat based off of these results.  UA with trace  ketones only, also sent for urine culture and recommended hydration.  CBG WNL and she is otherwise well appearing, no evidence of DKA.  She was given ED precautions including worsening of symptoms, fever, flank pain, difficulty tolerating p.o.  Advised to follow-up with PCP for glucometer and further management of her diabetes.  She was discharged home in stable condition.   Final Clinical Impressions(s) / UC Diagnoses   Final diagnoses:  Vaginal discharge  Dysuria     Discharge Instructions      We will treat your vaginal discharge based off of the results that we will get back tomorrow.  Someone will contact you if you need to be treated.  Your urine did not show infection, but make sure you stay well-hydrated and we will send your urine for culture.  If you have significant worsening of your symptoms, especially if you develop fever, pain in your lower back on 1 side or the other, significant pain, inability to tolerate anything by mouth, you should be seen by medical provider right away.  I recommend that you follow-up with your primary care provider to discuss getting a glucometer to use at home especially given your use of insulin.  If you experience signs of hypoglycemia including shaking, difficulty focusing, feeling very fatigued, otherwise feeling unwell, you should be seen by medical provider right away.     ED Prescriptions   None    PDMP not reviewed this encounter.   Cleophas Dunker, DO 12/31/20 1839    Cyruss Arata, Bernita Raisin, DO 12/31/20 1840

## 2020-12-31 NOTE — Discharge Instructions (Addendum)
We will treat your vaginal discharge based off of the results that we will get back tomorrow.  Someone will contact you if you need to be treated.  Your urine did not show infection, but make sure you stay well-hydrated and we will send your urine for culture.  If you have significant worsening of your symptoms, especially if you develop fever, pain in your lower back on 1 side or the other, significant pain, inability to tolerate anything by mouth, you should be seen by medical provider right away.  I recommend that you follow-up with your primary care provider to discuss getting a glucometer to use at home especially given your use of insulin.  If you experience signs of hypoglycemia including shaking, difficulty focusing, feeling very fatigued, otherwise feeling unwell, you should be seen by medical provider right away.

## 2020-12-31 NOTE — ED Triage Notes (Signed)
Pt reports that for 4 days had white vaginal discharge that is "thick, and cottage cheese like" Vaginal swelling.

## 2021-01-01 LAB — URINE CULTURE

## 2021-01-02 LAB — CERVICOVAGINAL ANCILLARY ONLY
Bacterial Vaginitis (gardnerella): POSITIVE — AB
Candida Glabrata: NEGATIVE
Candida Vaginitis: POSITIVE — AB
Chlamydia: NEGATIVE
Comment: NEGATIVE
Comment: NEGATIVE
Comment: NEGATIVE
Comment: NEGATIVE
Comment: NEGATIVE
Comment: NORMAL
Neisseria Gonorrhea: NEGATIVE
Trichomonas: NEGATIVE

## 2021-01-08 ENCOUNTER — Telehealth (HOSPITAL_COMMUNITY): Payer: Self-pay | Admitting: Emergency Medicine

## 2021-01-08 MED ORDER — METRONIDAZOLE 500 MG PO TABS: 500.0000 mg | ORAL_TABLET | Freq: Two times a day (BID) | ORAL | 0 refills | Status: DC

## 2021-01-08 MED ORDER — TERCONAZOLE 0.4 % VA CREA: 1.0000 | TOPICAL_CREAM | Freq: Every day | VAGINAL | 0 refills | Status: AC

## 2021-01-11 ENCOUNTER — Other Ambulatory Visit: Payer: Self-pay

## 2021-01-11 ENCOUNTER — Ambulatory Visit (HOSPITAL_COMMUNITY)
Admission: EM | Admit: 2021-01-11 | Discharge: 2021-01-11 | Disposition: A | Discharge status: Home / Self Care | Payer: Medicaid Other | Attending: Emergency Medicine | Admitting: Emergency Medicine

## 2021-01-11 ENCOUNTER — Encounter (HOSPITAL_COMMUNITY): Payer: Self-pay

## 2021-01-11 DIAGNOSIS — R0981 Nasal congestion: Secondary | ICD-10-CM | POA: Diagnosis not present

## 2021-01-11 DIAGNOSIS — R0602 Shortness of breath: Secondary | ICD-10-CM | POA: Diagnosis not present

## 2021-01-11 MED ORDER — ALBUTEROL SULFATE HFA 108 (90 BASE) MCG/ACT IN AERS: 1.0000 | INHALATION_SPRAY | Freq: Four times a day (QID) | RESPIRATORY_TRACT | 0 refills | Status: DC | PRN

## 2021-01-11 MED ORDER — FLUTICASONE PROPIONATE 50 MCG/ACT NA SUSP: 2.0000 | Freq: Every day | NASAL | 0 refills | Status: DC

## 2021-01-11 NOTE — Discharge Instructions (Signed)
You can use the albuterol inhaler 1 to 2 puffs every 6 hours as needed for wheezing or shortness of breath.  Use the Flonase 2 sprays in each nostril daily to help with nasal congestion. You can also take a daily Zyrtec or Claritin.  Follow-up with your primary care provider soon as possible for reevaluation.

## 2021-01-11 NOTE — ED Triage Notes (Signed)
Pt presents with nasal congestion;  pt describes having difficulty breathing out of her nose X 3 weeks.

## 2021-01-11 NOTE — ED Provider Notes (Signed)
[facility]    CSN: [id number] Arrival date & time: 01/11/21  1618      History   Chief Complaint Chief Complaint  Patient presents with   Nasal Congestion    HPI [name] is a 30 y.o. female.   Patient here for evaluation of nasal congestion and shortness of breath that has been ongoing for the past 3 weeks.  Patient reports that she was seen by her PCP several days ago and was supposed to be prescribed an inhaler and medication to "open up her lungs" but that prescriptions were not sent in.  Reports concern about COPD due to her history of smoking.  Patient is still an active smoker.  Denies any trauma, injury, or other precipitating event.  Denies any specific alleviating or aggravating factors.  Denies any fevers, chest pain, N/V/D, numbness, tingling, weakness, abdominal pain, or headaches.   The history is provided by the patient.   Past Medical History:  Diagnosis Date   Anxiety    Bipolar 1 disorder ([place])    Cognitive deficits    Depression    Diabetes mellitus without complication ([place])    Hypertension    Mental disorder    Mental health disorder    Obesity     Patient Active Problem List   Diagnosis Date Noted   Syphilis 07/15/2020   Malingering 06/05/2020   Gastroesophageal reflux disease 05/04/2020   Hyperglycemia due to type 2 diabetes mellitus ([place]) 05/04/2020   Long term (current) use of insulin ([place]) 05/04/2020   Migraine without aura 05/04/2020   Morbid obesity ([place]) 05/04/2020   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 2 diabetes mellitus ([place]) 05/04/2020   Prolapsed internal hemorrhoids 05/04/2020   Vitamin D deficiency 05/04/2020   Other symptoms and signs involving cognitive functions and awareness 05/04/2020   Suicide attempt ([place])    Anxiety state 03/06/2020   Schizophrenia ([place]) 09/13/2019   Bipolar I disorder, most recent episode depressed ([place]) 06/23/2019   MDD (major depressive disorder) 10/10/2018   Schizoaffective disorder,  bipolar type ([place]) 09/25/2018   Affective psychosis, bipolar ([place]) 06/13/2018   HTN (hypertension) 05/03/2018   Tobacco use disorder 05/03/2018   Adjustment disorder with emotional disturbance 01/02/2018   Schizophrenia, 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   Moderate bipolar I disorder, most recent episode depressed ([place])    Psychosis ([place])    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/2017   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   OCD (obsessive compulsive disorder) 10/05/2016   Major depressive disorder, recurrent episode, mild ([place]) 05/04/2016   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   Learning disability 07/18/2015   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/2015   Diabetes mellitus ([place]) 07/18/2015   MDD (major depressive disorder), recurrent, severe, 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/2015   Hyperlipidemia 07/18/2015   Severe episode of recurrent major depressive disorder, without psychotic features ([place])    Suicidal ideation    Drug overdose    Cognitive deficits 10/12/2012   Generalized anxiety disorder 06/28/2012    Past Surgical History:  Procedure Laterality Date   CESAREAN SECTION     CESAREAN SECTION N/A 04/25/2013   Procedure: REPEAT CESAREAN SECTION;  Surgeon: [name], MD;  Location: [facility];  Service: Obstetrics;  Laterality: N/A;   MASS EXCISION N/A 06/03/2012   Procedure: EXCISION MASS;  Surgeon: [name], MD;  Location: [facility];  Service: ENT;  Laterality: N/A;  Excision uvula mass   TONSILLECTOMY N/A 06/03/2012   Procedure: TONSILLECTOMY;  Surgeon: [name], MD;  Location: [facility];  Service: ENT;  Laterality: N/A;   TONSILLECTOMY      OB History     Gravida  3   Para  3   Term  3   Preterm  0   AB  0   Living  3      SAB  0   IAB  0   Ectopic  0   Multiple      Live Births  3            Home Medications    Prior to Admission medications   Medication Sig Start Date End  Date Taking? Authorizing Provider  albuterol (VENTOLIN HFA) 108 (90 Base) MCG/ACT inhaler Inhale 1-2 puffs into the lungs every 6 (six) hours as needed for wheezing or shortness of breath. 01/11/21  Yes Pearson Forster, NP  fluticasone (FLONASE) 50 MCG/ACT nasal spray Place 2 sprays into both nostrils daily. 01/11/21  Yes Pearson Forster, NP  acetaminophen (TYLENOL) 500 MG tablet Take 1,000 mg by mouth every 6 (six) hours as needed for mild pain or headache.    [provider]  amLODipine (NORVASC) 5 MG tablet Take 5 mg by mouth daily.    [provider]  benztropine (COGENTIN) 1 MG tablet Take 1 mg by mouth 2 (two) times daily. 05/01/20   [provider]  cholecalciferol (VITAMIN D) 25 MCG (1000 UNIT) tablet Take 1,000 Units by mouth daily. 04/30/20   [provider]  divalproex (DEPAKOTE ER) 500 MG 24 hr tablet Take 1,000 mg by mouth in the morning and at bedtime.    [provider]  glimepiride (AMARYL) 4 MG tablet Take 4 mg by mouth daily.    [provider]  haloperidol (HALDOL) 10 MG tablet Take 10-20 mg by mouth 2 (two) times daily. '10mg'$  daily and '20mg'$  at bedtime. 05/09/20   [provider]  haloperidol decanoate (HALDOL DECANOATE) 100 MG/ML injection Inject 150 mg into the muscle every 28 (twenty-eight) days. Inject '150mg'$  (1.48m) every 28 days.    [provider]  hydrOXYzine (ATARAX/VISTARIL) 50 MG tablet Take 50 mg by mouth at bedtime as needed for anxiety.    [provider]  insulin detemir (LEVEMIR) 100 UNIT/ML injection Inject 0.4 mLs (40 Units total) into the skin 2 (two) times daily. 05/05/20   BMontine Circle PA-C  lithium carbonate (ESKALITH) 450 MG CR tablet Take 450 mg by mouth daily.    [provider]  metroNIDAZOLE (FLAGYL) 500 MG tablet Take 1 tablet (500 mg total) by mouth 2 (two) times daily. 01/08/21   LChase Picket MD  omeprazole (PRILOSEC) 20 MG capsule Take 20 mg by mouth daily.     [provider]  ondansetron (ZOFRAN) 4 MG tablet Take 1 tablet (4 mg total) by mouth every 6 (six) hours. 09/07/20   LPeri Jefferson PA-C  prazosin (MINIPRESS) 5 MG capsule Take 5 mg by mouth at bedtime.    [provider]  primidone (MYSOLINE) 50 MG tablet Take 100 mg by mouth 3 (three) times daily.    [provider]  simvastatin (ZOCOR) 20 MG tablet Take 20 mg by mouth daily. Patient not taking: Reported on 11/29/2020    [provider]  terconazole (TERAZOL 7) 0.4 % vaginal cream Place 1 applicator vaginally at bedtime for 5 days. 01/08/21 01/13/21  LChase Picket MD  vitamin B-12 (CYANOCOBALAMIN) 1000 MCG tablet Take 1,000 mcg by mouth daily. Patient not taking:  Reported on 11/29/2020 11/13/19   [provider]    Family History Family History  Problem Relation Age of Onset   Hypertension Mother    Diabetes Father     Social History Social History   Tobacco Use   Smoking status: Every Day    Packs/day: 4.00    Years: 11.00    Pack years: 44.00    Types: Cigarettes, Cigars   Smokeless tobacco: Never   Tobacco comments:    Pt declined  Vaping Use   Vaping Use: Never used  Substance Use Topics   Alcohol use: Not Currently   Drug use: Not Currently    Types: "Crack" cocaine, Other-see comments    Comment: Patient reports hx of smoking Crack     Allergies   Wellbutrin [bupropion], Omnipaque [iohexol], Contrast media [iodinated diagnostic agents], Depakote [divalproex sodium], Hydroxyzine, and Penicillin g   Review of Systems Review of Systems  HENT:  Positive for congestion.   Respiratory:  Positive for shortness of breath.   All other systems reviewed and are negative.   Physical Exam Triage Vital Signs ED Triage Vitals  Enc Vitals Group     BP 01/11/21 1637 (!) 141/86     Pulse Rate 01/11/21 1637 92     Resp 01/11/21 1637 16     Temp 01/11/21 1637 98.6 F (37 C)     Temp Source 01/11/21 1637 Oral     SpO2  01/11/21 1637 97 %     Weight --      Height --      Head Circumference --      Peak Flow --      Pain Score 01/11/21 1640 0     Pain Loc --      Pain Edu? --      Excl. in [place]? --    No data found.  Updated Vital Signs BP (!) 141/86 (BP Location: Right Arm)   Pulse 92   Temp 98.6 F (37 C) (Oral)   Resp 16   LMP 12/17/2020   SpO2 97%   Visual Acuity Right Eye Distance:   Left Eye Distance:   Bilateral Distance:    Right Eye Near:   Left Eye Near:    Bilateral Near:     Physical Exam Vitals and nursing note reviewed.  Constitutional:      General: She is not in acute distress.    Appearance: Normal appearance. She is not ill-appearing, toxic-appearing or diaphoretic.  HENT:     Head: Normocephalic and atraumatic.     Nose:     Right Turbinates: Swollen. Not pale.     Left Turbinates: Swollen. Not pale.     Right Sinus: No maxillary sinus tenderness or frontal sinus tenderness.     Left Sinus: No maxillary sinus tenderness or frontal sinus tenderness.  Eyes:     Conjunctiva/sclera: Conjunctivae normal.  Cardiovascular:     Rate and Rhythm: Normal rate.     Pulses: Normal pulses.     Heart sounds: Normal heart sounds.  Pulmonary:     Effort: Pulmonary effort is normal.     Breath sounds: Normal breath sounds. No stridor. No wheezing, rhonchi or rales.  Chest:     Chest wall: No tenderness.  Abdominal:     General: Abdomen is flat.  Musculoskeletal:        General: Normal range of motion.     Cervical back: Normal range of motion.  Skin:  General: Skin is warm and dry.  Neurological:     General: No focal deficit present.     Mental Status: She is alert and oriented to person, place, and time.  Psychiatric:        Mood and Affect: Mood normal.     UC Treatments / Results  Labs (all labs ordered are listed, but only abnormal results are displayed) Labs Reviewed - No data to display  EKG   Radiology No results found.  Procedures Procedures  (including critical care time)  Medications Ordered in UC Medications - No data to display  Initial Impression / Assessment and Plan / UC Course  I have reviewed the triage vital signs and the nursing notes.  Pertinent labs & imaging results that were available during my care of the patient were reviewed by me and considered in my medical decision making (see chart for details).    Assessment negative for red flags or concerns.  Nasal congestion and shortness of breath due to seasonal allergies versus viral illness versus COPD.  Albuterol inhaler as needed for wheezing and shortness of breath.  Flonase 2 sprays in each nostril daily.  Recommend Zyrtec or Claritin daily.  Follow-up with primary care soon as possible for reevaluation.  Strict ED follow-up for any red flag symptoms. Final Clinical Impressions(s) / UC Diagnoses   Final diagnoses:  Nasal congestion  Shortness of breath     Discharge Instructions      You can use the albuterol inhaler 1 to 2 puffs every 6 hours as needed for wheezing or shortness of breath.  Use the Flonase 2 sprays in each nostril daily to help with nasal congestion. You can also take a daily Zyrtec or Claritin.  Follow-up with your primary care provider soon as possible for reevaluation.     ED Prescriptions     Medication Sig Dispense Auth. Provider   albuterol (VENTOLIN HFA) 108 (90 Base) MCG/ACT inhaler Inhale 1-2 puffs into the lungs every 6 (six) hours as needed for wheezing or shortness of breath. 18 g Pearson Forster, NP   fluticasone (FLONASE) 50 MCG/ACT nasal spray Place 2 sprays into both nostrils daily. 18.2 mL Pearson Forster, NP      PDMP not reviewed this encounter.   Pearson Forster, NP 01/11/21 1655

## 2021-01-15 ENCOUNTER — Encounter (HOSPITAL_COMMUNITY): Payer: Self-pay

## 2021-01-15 ENCOUNTER — Emergency Department (HOSPITAL_COMMUNITY)
Admission: EM | Admit: 2021-01-15 | Discharge: 2021-01-15 | Disposition: A | Discharge status: Home / Self Care | Payer: Medicaid Other | Attending: Physician Assistant | Admitting: Physician Assistant

## 2021-01-15 ENCOUNTER — Other Ambulatory Visit: Payer: Self-pay

## 2021-01-15 DIAGNOSIS — R531 Weakness: Secondary | ICD-10-CM | POA: Insufficient documentation

## 2021-01-15 DIAGNOSIS — R0602 Shortness of breath: Secondary | ICD-10-CM | POA: Diagnosis not present

## 2021-01-15 DIAGNOSIS — R5383 Other fatigue: Secondary | ICD-10-CM | POA: Diagnosis not present

## 2021-01-15 DIAGNOSIS — R0981 Nasal congestion: Secondary | ICD-10-CM | POA: Diagnosis not present

## 2021-01-15 DIAGNOSIS — Z5321 Procedure and treatment not carried out due to patient leaving prior to being seen by health care provider: Secondary | ICD-10-CM | POA: Insufficient documentation

## 2021-01-15 DIAGNOSIS — J029 Acute pharyngitis, unspecified: Secondary | ICD-10-CM | POA: Diagnosis not present

## 2021-01-15 NOTE — ED Provider Notes (Signed)
Emergency Medicine Provider Triage Evaluation Note  [name] , a 30 y.o. female  was evaluated in triage.  Pt complains of congestion.  Patient reports associated sore throat, fatigue, weakness.  Also complains of mild shortness of breath.  States that her symptoms started about 5 days ago.  Denies a cough.  Physical Exam  BP (!) 155/114   Pulse 100   Temp 98.5 F (36.9 C)   Resp 18   Ht 5\' 7"  (1.702 m)   Wt 122 kg   LMP 12/17/2020   SpO2 98%   BMI 42.13 kg/m  Gen:   Awake, no distress   Resp:  Normal effort  MSK:   Moves extremities without difficulty  Other:  Uvula midline.  Readily handling secretions.  No significant erythema noted in the posterior oropharynx.  Medical Decision Making  Medically screening exam initiated at 2:51 PM.  Appropriate orders placed.  [name] was informed that the remainder of the evaluation will be completed by another provider, this initial triage assessment does not replace that evaluation, and the importance of remaining in the ED until their evaluation is complete.   Rayna Sexton, PA-C 78/29/56 2130    Gray, [place], DO 86/57/84 [phone]

## 2021-01-15 NOTE — ED Triage Notes (Signed)
Pt reports congestion with shob/weakness/fatigue/sore throat x 5 days.

## 2021-01-28 ENCOUNTER — Other Ambulatory Visit: Payer: Self-pay

## 2021-01-28 ENCOUNTER — Ambulatory Visit (HOSPITAL_COMMUNITY)
Admission: EM | Admit: 2021-01-28 | Discharge: 2021-01-28 | Disposition: A | Discharge status: Home / Self Care | Payer: Medicaid Other | Attending: Family Medicine | Admitting: Family Medicine

## 2021-01-28 ENCOUNTER — Encounter (HOSPITAL_COMMUNITY): Payer: Self-pay

## 2021-01-28 DIAGNOSIS — Z794 Long term (current) use of insulin: Secondary | ICD-10-CM | POA: Diagnosis not present

## 2021-01-28 DIAGNOSIS — R112 Nausea with vomiting, unspecified: Secondary | ICD-10-CM | POA: Diagnosis present

## 2021-01-28 DIAGNOSIS — Z7901 Long term (current) use of anticoagulants: Secondary | ICD-10-CM | POA: Diagnosis not present

## 2021-01-28 DIAGNOSIS — R5383 Other fatigue: Secondary | ICD-10-CM | POA: Insufficient documentation

## 2021-01-28 DIAGNOSIS — E119 Type 2 diabetes mellitus without complications: Secondary | ICD-10-CM | POA: Insufficient documentation

## 2021-01-28 DIAGNOSIS — Z79899 Other long term (current) drug therapy: Secondary | ICD-10-CM | POA: Insufficient documentation

## 2021-01-28 DIAGNOSIS — Z20822 Contact with and (suspected) exposure to covid-19: Secondary | ICD-10-CM | POA: Insufficient documentation

## 2021-01-28 DIAGNOSIS — I10 Essential (primary) hypertension: Secondary | ICD-10-CM | POA: Diagnosis not present

## 2021-01-28 DIAGNOSIS — Z880 Allergy status to penicillin: Secondary | ICD-10-CM | POA: Insufficient documentation

## 2021-01-28 DIAGNOSIS — F17210 Nicotine dependence, cigarettes, uncomplicated: Secondary | ICD-10-CM | POA: Diagnosis not present

## 2021-01-28 LAB — POCT URINALYSIS DIPSTICK, ED / UC
Bilirubin Urine: NEGATIVE
Glucose, UA: NEGATIVE mg/dL
Hgb urine dipstick: NEGATIVE
Ketones, ur: NEGATIVE mg/dL
Leukocytes,Ua: NEGATIVE
Nitrite: NEGATIVE
Protein, ur: NEGATIVE mg/dL
Specific Gravity, Urine: 1.02 (ref 1.005–1.030)
Urobilinogen, UA: 0.2 mg/dL (ref 0.0–1.0)
pH: 7 (ref 5.0–8.0)

## 2021-01-28 LAB — CBG MONITORING, ED: Glucose-Capillary: 114 mg/dL — ABNORMAL HIGH (ref 70–99)

## 2021-01-28 LAB — POC URINE PREG, ED: Preg Test, Ur: NEGATIVE

## 2021-01-28 MED ORDER — ONDANSETRON 4 MG PO TBDP: 4.0000 mg | ORAL_TABLET | Freq: Three times a day (TID) | ORAL | 0 refills | Status: DC | PRN

## 2021-01-28 NOTE — ED Triage Notes (Signed)
Pt c/o vomiting x1 today with increase fatigue and weakness. Pt c/o fatigue x2wks. Pt requesting CBG, pregnancy test, and covid test.

## 2021-01-28 NOTE — ED Provider Notes (Signed)
[facility]    CSN: [id number] Arrival date & time: 01/28/21  1615      History   Chief Complaint Chief Complaint  Patient presents with   Emesis   Fatigue    HPI Keshonna Rusty Glodowski is a 30 y.o. female.   Presenting today with 1 day of nausea, vomiting, fatigue.  Denies abdominal pain, diarrhea, constipation, intolerance to p.o., headache, congestion, cough, chest pain, shortness of breath.  No known sick contacts recently.  Unsure of her last menstrual period.  Requesting her blood sugar to be checked, pregnancy test, COVID test today.  She does have a history of diabetes, bipolar disorder, anxiety, depression, hypertension.  Not taking anything over-the-counter for symptoms thus far.   Past Medical History:  Diagnosis Date   Anxiety    Bipolar 1 disorder ([place])    Cognitive deficits    Depression    Diabetes mellitus without complication ([place])    Hypertension    Mental disorder    Mental health disorder    Obesity     Patient Active Problem List   Diagnosis Date Noted   Syphilis 07/15/2020   Malingering 06/05/2020   Gastroesophageal reflux disease 05/04/2020   Hyperglycemia due to type 2 diabetes mellitus ([place]) 05/04/2020   Long term (current) use of insulin ([place]) 05/04/2020   Migraine without aura 05/04/2020   Morbid obesity ([place]) 05/04/2020   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 2 diabetes mellitus ([place]) 05/04/2020   Prolapsed internal hemorrhoids 05/04/2020   Vitamin D deficiency 05/04/2020   Other symptoms and signs involving cognitive functions and awareness 05/04/2020   Suicide attempt ([place])    Anxiety state 03/06/2020   Schizophrenia ([place]) 09/13/2019   Bipolar I disorder, most recent episode depressed ([place]) 06/23/2019   MDD (major depressive disorder) 10/10/2018   Schizoaffective disorder, bipolar type ([place]) 09/25/2018   Affective psychosis, bipolar ([place]) 06/13/2018   HTN (hypertension) 05/03/2018   Tobacco use disorder 05/03/2018   Adjustment disorder  with emotional disturbance 01/02/2018   Schizophrenia, 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   Moderate bipolar I disorder, most recent episode depressed ([place])    Psychosis ([place])    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/2017   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   OCD (obsessive compulsive disorder) 10/05/2016   Major depressive disorder, recurrent episode, mild ([place]) 05/04/2016   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   Learning disability 07/18/2015   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/2015   Diabetes mellitus ([place]) 07/18/2015   MDD (major depressive disorder), recurrent, severe, 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   Hyperlipidemia 07/18/2015   Severe episode of recurrent major depressive disorder, without psychotic features ([place])    Suicidal ideation    Drug overdose    Cognitive deficits 10/12/2012   Generalized anxiety disorder 06/28/2012    Past Surgical History:  Procedure Laterality Date   CESAREAN SECTION     CESAREAN SECTION N/A 04/25/2013   Procedure: REPEAT CESAREAN SECTION;  Surgeon: [name], MD;  Location: [facility];  Service: Obstetrics;  Laterality: N/A;   MASS EXCISION N/A 06/03/2012   Procedure: EXCISION MASS;  Surgeon: [name], MD;  Location: [facility];  Service: ENT;  Laterality: N/A;  Excision uvula mass   TONSILLECTOMY N/A 06/03/2012   Procedure: TONSILLECTOMY;  Surgeon: [name], MD;  Location: [facility];  Service: ENT;  Laterality: N/A;   TONSILLECTOMY      OB History     Gravida  3   Para  3   Term  3   Preterm  0   AB  0   Living  3      SAB  0   IAB  0   Ectopic  0   Multiple      Live Births  3            Home Medications    Prior to Admission medications   Medication Sig Start Date End Date Taking? Authorizing Provider  ondansetron (ZOFRAN ODT) 4 MG disintegrating tablet Take 1 tablet (4 mg total) by mouth every 8 (eight) hours as needed for nausea or  vomiting. 01/28/21  Yes Volney American, PA-C  acetaminophen (TYLENOL) 500 MG tablet Take 1,000 mg by mouth every 6 (six) hours as needed for mild pain or headache.    [provider]  albuterol (VENTOLIN HFA) 108 (90 Base) MCG/ACT inhaler Inhale 1-2 puffs into the lungs every 6 (six) hours as needed for wheezing or shortness of breath. 01/11/21   Pearson Forster, NP  amLODipine (NORVASC) 5 MG tablet Take 5 mg by mouth daily.    [provider]  benztropine (COGENTIN) 1 MG tablet Take 1 mg by mouth 2 (two) times daily. 05/01/20   [provider]  cholecalciferol (VITAMIN D) 25 MCG (1000 UNIT) tablet Take 1,000 Units by mouth daily. 04/30/20   [provider]  divalproex (DEPAKOTE ER) 500 MG 24 hr tablet Take 1,000 mg by mouth in the morning and at bedtime.    [provider]  fluticasone (FLONASE) 50 MCG/ACT nasal spray Place 2 sprays into both nostrils daily. 01/11/21   Pearson Forster, NP  glimepiride (AMARYL) 4 MG tablet Take 4 mg by mouth daily.    [provider]  haloperidol (HALDOL) 10 MG tablet Take 10-20 mg by mouth 2 (two) times daily. 10mg  daily and 20mg  at bedtime. 05/09/20   [provider]  haloperidol decanoate (HALDOL DECANOATE) 100 MG/ML injection Inject 150 mg into the muscle every 28 (twenty-eight) days. Inject 150mg  (1.59ml) every 28 days.    [provider]  hydrOXYzine (ATARAX/VISTARIL) 50 MG tablet Take 50 mg by mouth at bedtime as needed for anxiety.    [provider]  insulin detemir (LEVEMIR) 100 UNIT/ML injection Inject 0.4 mLs (40 Units total) into the skin 2 (two) times daily. 05/05/20   Montine Circle, PA-C  lithium carbonate (ESKALITH) 450 MG CR tablet Take 450 mg by mouth daily.    [provider]  omeprazole (PRILOSEC) 20 MG capsule Take 20 mg by mouth daily.    [provider]  prazosin (MINIPRESS) 5 MG capsule Take 5 mg by mouth at bedtime.    [provider]   primidone (MYSOLINE) 50 MG tablet Take 100 mg by mouth 3 (three) times daily.    [provider]  simvastatin (ZOCOR) 20 MG tablet Take 20 mg by mouth daily. Patient not taking: Reported on 11/29/2020    [provider]  vitamin B-12 (CYANOCOBALAMIN) 1000 MCG tablet Take 1,000 mcg by mouth daily. Patient not taking: Reported on 11/29/2020 11/13/19   [provider]    Family History Family History  Problem Relation Age of Onset   Hypertension Mother    Diabetes Father     Social History Social History   Tobacco Use   Smoking status: Every Day    Packs/day: 4.00    Years: 11.00    Pack years: 44.00    Types: Cigarettes, Cigars   Smokeless tobacco: Never  Tobacco comments:    Pt declined  Vaping Use   Vaping Use: Never used  Substance Use Topics   Alcohol use: Not Currently   Drug use: Not Currently    Types: "Crack" cocaine, Other-see comments    Comment: Patient reports hx of smoking Crack     Allergies   Wellbutrin [bupropion], Omnipaque [iohexol], Contrast media [iodinated diagnostic agents], Depakote [divalproex sodium], Hydroxyzine, and Penicillin g   Review of Systems Review of Systems Per HPI  Physical Exam Triage Vital Signs ED Triage Vitals  Enc Vitals Group     BP 01/28/21 1732 (!) 129/97     Pulse Rate 01/28/21 1732 91     Resp 01/28/21 1732 18     Temp 01/28/21 1732 98.7 F (37.1 C)     Temp Source 01/28/21 1732 Oral     SpO2 01/28/21 1732 96 %     Weight --      Height --      Head Circumference --      Peak Flow --      Pain Score 01/28/21 1733 0     Pain Loc --      Pain Edu? --      Excl. in [place]? --    No data found.  Updated Vital Signs BP (!) 129/97 (BP Location: Left Arm)   Pulse 91   Temp 98.7 F (37.1 C) (Oral)   Resp 18   LMP  (LMP Unknown)   SpO2 96%   Visual Acuity Right Eye Distance:   Left Eye Distance:   Bilateral Distance:    Right Eye Near:   Left Eye Near:    Bilateral Near:      Physical Exam Vitals and nursing note reviewed.  Constitutional:      Appearance: Normal appearance. She is not ill-appearing.  HENT:     Head: Atraumatic.     Mouth/Throat:     Mouth: Mucous membranes are moist.  Eyes:     Extraocular Movements: Extraocular movements intact.     Conjunctiva/sclera: Conjunctivae normal.  Cardiovascular:     Rate and Rhythm: Normal rate and regular rhythm.     Heart sounds: Normal heart sounds.  Pulmonary:     Effort: Pulmonary effort is normal. No respiratory distress.     Breath sounds: Normal breath sounds. No wheezing or rales.  Abdominal:     General: Bowel sounds are normal. There is no distension.     Palpations: Abdomen is soft.     Tenderness: There is no abdominal tenderness. There is no right CVA tenderness, left CVA tenderness or guarding.  Musculoskeletal:        General: Normal range of motion.     Cervical back: Normal range of motion and neck supple.  Skin:    General: Skin is warm and dry.  Neurological:     Mental Status: She is alert and oriented to person, place, and time.  Psychiatric:        Mood and Affect: Mood normal.        Thought Content: Thought content normal.        Judgment: Judgment normal.     UC Treatments / Results  Labs (all labs ordered are listed, but only abnormal results are displayed) Labs Reviewed  CBG MONITORING, ED - Abnormal; Notable for the following components:      Result Value   Glucose-Capillary 114 (*)    All other components within normal limits  SARS CORONAVIRUS 2 (  TAT 6-24 HRS)  POC URINE PREG, ED  POCT URINALYSIS DIPSTICK, ED / UC  POC URINE PREG, ED  CBG MONITORING, ED    EKG   Radiology No results found.  Procedures Procedures (including critical care time)  Medications Ordered in UC Medications - No data to display  Initial Impression / Assessment and Plan / UC Course  I have reviewed the triage vital signs and the nursing notes.  Pertinent labs & imaging  results that were available during my care of the patient were reviewed by me and considered in my medical decision making (see chart for details).     Exam and vital signs reassuring, urinalysis negative for urinary tract infection, urine pregnancy negative, CBG is 114.  COVID PCR pending, unclear etiology of her vomiting and fatigue at this time.  Quarantine while awaiting results, Zofran as needed, brat diet, fluids.  Return for acutely worsening symptoms.  Final Clinical Impressions(s) / UC Diagnoses   Final diagnoses:  Nausea and vomiting, unspecified vomiting type  Fatigue, unspecified type   Discharge Instructions   None    ED Prescriptions     Medication Sig Dispense Auth. Provider   ondansetron (ZOFRAN ODT) 4 MG disintegrating tablet Take 1 tablet (4 mg total) by mouth every 8 (eight) hours as needed for nausea or vomiting. 20 tablet Volney American, [place]      PDMP not reviewed this encounter.   Volney American, [place] 01/28/21 1816

## 2021-01-29 LAB — SARS CORONAVIRUS 2 (TAT 6-24 HRS): SARS Coronavirus 2: NEGATIVE

## 2021-01-31 ENCOUNTER — Encounter (HOSPITAL_COMMUNITY): Payer: Self-pay

## 2021-01-31 ENCOUNTER — Other Ambulatory Visit: Payer: Self-pay

## 2021-01-31 ENCOUNTER — Emergency Department (HOSPITAL_COMMUNITY)
Admission: EM | Admit: 2021-01-31 | Discharge: 2021-01-31 | Disposition: A | Discharge status: Home / Self Care | Payer: Medicaid Other | Attending: Student | Admitting: Student

## 2021-01-31 DIAGNOSIS — R509 Fever, unspecified: Secondary | ICD-10-CM | POA: Insufficient documentation

## 2021-01-31 DIAGNOSIS — R42 Dizziness and giddiness: Secondary | ICD-10-CM | POA: Insufficient documentation

## 2021-01-31 DIAGNOSIS — R112 Nausea with vomiting, unspecified: Secondary | ICD-10-CM | POA: Insufficient documentation

## 2021-01-31 DIAGNOSIS — Z5321 Procedure and treatment not carried out due to patient leaving prior to being seen by health care provider: Secondary | ICD-10-CM | POA: Insufficient documentation

## 2021-01-31 DIAGNOSIS — R5383 Other fatigue: Secondary | ICD-10-CM | POA: Diagnosis not present

## 2021-01-31 LAB — CBC WITH DIFFERENTIAL/PLATELET
Abs Immature Granulocytes: 0.06 10*3/uL (ref 0.00–0.07)
Basophils Absolute: 0 10*3/uL (ref 0.0–0.1)
Basophils Relative: 0 %
Eosinophils Absolute: 0.2 10*3/uL (ref 0.0–0.5)
Eosinophils Relative: 2 %
HCT: 36.3 % (ref 36.0–46.0)
Hemoglobin: 11.5 g/dL — ABNORMAL LOW (ref 12.0–15.0)
Immature Granulocytes: 0 %
Lymphocytes Relative: 12 %
Lymphs Abs: 1.6 10*3/uL (ref 0.7–4.0)
MCH: 25.4 pg — ABNORMAL LOW (ref 26.0–34.0)
MCHC: 31.7 g/dL (ref 30.0–36.0)
MCV: 80.1 fL (ref 80.0–100.0)
Monocytes Absolute: 1.1 10*3/uL — ABNORMAL HIGH (ref 0.1–1.0)
Monocytes Relative: 8 %
Neutro Abs: 10.8 10*3/uL — ABNORMAL HIGH (ref 1.7–7.7)
Neutrophils Relative %: 78 %
Platelets: 189 10*3/uL (ref 150–400)
RBC: 4.53 MIL/uL (ref 3.87–5.11)
RDW: 17.2 % — ABNORMAL HIGH (ref 11.5–15.5)
WBC: 13.8 10*3/uL — ABNORMAL HIGH (ref 4.0–10.5)
nRBC: 0 % (ref 0.0–0.2)

## 2021-01-31 LAB — COMPREHENSIVE METABOLIC PANEL
ALT: 10 U/L (ref 0–44)
AST: 16 U/L (ref 15–41)
Albumin: 3.9 g/dL (ref 3.5–5.0)
Alkaline Phosphatase: 51 U/L (ref 38–126)
Anion gap: 7 (ref 5–15)
BUN: 8 mg/dL (ref 6–20)
CO2: 27 mmol/L (ref 22–32)
Calcium: 9.4 mg/dL (ref 8.9–10.3)
Chloride: 110 mmol/L (ref 98–111)
Creatinine, Ser: 0.82 mg/dL (ref 0.44–1.00)
GFR, Estimated: >60 mL/min (ref >60)
Glucose, Bld: 137 mg/dL — ABNORMAL HIGH (ref 70–99)
Potassium: 3.9 mmol/L (ref 3.5–5.1)
Sodium: 144 mmol/L (ref 135–145)
Total Bilirubin: 0.4 mg/dL (ref 0.3–1.2)
Total Protein: 7.6 g/dL (ref 6.5–8.1)

## 2021-01-31 LAB — I-STAT BETA HCG BLOOD, ED (MC, WL, AP ONLY): I-stat hCG, quantitative: <5 m[IU]/mL (ref <5)

## 2021-01-31 NOTE — ED Provider Notes (Signed)
Emergency Medicine Provider Triage Evaluation Note  [name] , a 30 y.o. female  was evaluated in triage.  Pt complains of neurolyse fatigue and vomiting for 4 days.  States that she started to feel unwell 4 days ago and has since felt nauseous with vomiting, intermittent dizziness.  States that she did not know she had a fever at home but presents with low-grade fever today.  She denies chest pain, shortness of breath, cough, diarrhea.  Review of Systems  Positive: Fatigue, vomiting Negative: Chest pain, shortness of breath  Physical Exam  BP (!) 135/99   Pulse (!) 116   Temp 100.2 F (37.9 C) (Oral)   Resp (!) 22   LMP  (LMP Unknown)   SpO2 98% Comment: Simultaneous filing. User may not have seen previous data. Gen:   Awake, no distress  Resp:  Normal effort, clear to auscultation bilaterally MSK:   Moves extremities without difficulty  Other:  Abdomen is soft, nontender, nondistended.  Medical Decision Making  Medically screening exam initiated at 5:27 PM.  Appropriate orders placed.  [name] was informed that the remainder of the evaluation will be completed by another provider, this initial triage assessment does not replace that evaluation, and the importance of remaining in the ED until their evaluation is complete.     Mickie Hillier, PA-C 01/31/21 [place], [place], DO 01/31/21 2312

## 2021-01-31 NOTE — ED Triage Notes (Signed)
Per EMS Patient c/o dizziness and vomiting  x 2 days. Patient states he has not felt well x 4 days.

## 2021-02-01 ENCOUNTER — Emergency Department (HOSPITAL_COMMUNITY)
Admission: EM | Admit: 2021-02-01 | Discharge: 2021-02-01 | Disposition: A | Discharge status: Home / Self Care | Payer: Medicaid Other | Attending: Emergency Medicine | Admitting: Emergency Medicine

## 2021-02-01 ENCOUNTER — Other Ambulatory Visit: Payer: Self-pay

## 2021-02-01 ENCOUNTER — Encounter (HOSPITAL_COMMUNITY): Payer: Self-pay

## 2021-02-01 DIAGNOSIS — F17290 Nicotine dependence, other tobacco product, uncomplicated: Secondary | ICD-10-CM | POA: Insufficient documentation

## 2021-02-01 DIAGNOSIS — Z7984 Long term (current) use of oral hypoglycemic drugs: Secondary | ICD-10-CM | POA: Insufficient documentation

## 2021-02-01 DIAGNOSIS — R000 Tachycardia, unspecified: Secondary | ICD-10-CM | POA: Insufficient documentation

## 2021-02-01 DIAGNOSIS — Z794 Long term (current) use of insulin: Secondary | ICD-10-CM | POA: Insufficient documentation

## 2021-02-01 DIAGNOSIS — R112 Nausea with vomiting, unspecified: Secondary | ICD-10-CM | POA: Insufficient documentation

## 2021-02-01 DIAGNOSIS — Z20822 Contact with and (suspected) exposure to covid-19: Secondary | ICD-10-CM | POA: Diagnosis not present

## 2021-02-01 DIAGNOSIS — Z79899 Other long term (current) drug therapy: Secondary | ICD-10-CM | POA: Diagnosis not present

## 2021-02-01 DIAGNOSIS — R5383 Other fatigue: Secondary | ICD-10-CM | POA: Insufficient documentation

## 2021-02-01 DIAGNOSIS — E1142 Type 2 diabetes mellitus with diabetic polyneuropathy: Secondary | ICD-10-CM | POA: Insufficient documentation

## 2021-02-01 DIAGNOSIS — I10 Essential (primary) hypertension: Secondary | ICD-10-CM | POA: Diagnosis not present

## 2021-02-01 LAB — BASIC METABOLIC PANEL
Anion gap: 8 (ref 5–15)
BUN: 8 mg/dL (ref 6–20)
CO2: 25 mmol/L (ref 22–32)
Calcium: 8.9 mg/dL (ref 8.9–10.3)
Chloride: 105 mmol/L (ref 98–111)
Creatinine, Ser: 0.94 mg/dL (ref 0.44–1.00)
GFR, Estimated: >60 mL/min (ref >60)
Glucose, Bld: 186 mg/dL — ABNORMAL HIGH (ref 70–99)
Potassium: 3.8 mmol/L (ref 3.5–5.1)
Sodium: 138 mmol/L (ref 135–145)

## 2021-02-01 LAB — RESP PANEL BY RT-PCR (FLU A&B, COVID) ARPGX2
Influenza A by PCR: NEGATIVE
Influenza B by PCR: NEGATIVE
SARS Coronavirus 2 by RT PCR: NEGATIVE

## 2021-02-01 LAB — CBG MONITORING, ED: Glucose-Capillary: 182 mg/dL — ABNORMAL HIGH (ref 70–99)

## 2021-02-01 LAB — URINALYSIS, ROUTINE W REFLEX MICROSCOPIC
Bilirubin Urine: NEGATIVE
Glucose, UA: 50 mg/dL — AB
Ketones, ur: 20 mg/dL — AB
Leukocytes,Ua: NEGATIVE
Nitrite: NEGATIVE
Protein, ur: 100 mg/dL — AB
RBC / HPF: >50 RBC/hpf — ABNORMAL HIGH (ref 0–5)
Specific Gravity, Urine: 1.033 — ABNORMAL HIGH (ref 1.005–1.030)
pH: 5 (ref 5.0–8.0)

## 2021-02-01 LAB — CBC
HCT: 34.9 % — ABNORMAL LOW (ref 36.0–46.0)
Hemoglobin: 10.8 g/dL — ABNORMAL LOW (ref 12.0–15.0)
MCH: 25.1 pg — ABNORMAL LOW (ref 26.0–34.0)
MCHC: 30.9 g/dL (ref 30.0–36.0)
MCV: 81 fL (ref 80.0–100.0)
Platelets: 169 10*3/uL (ref 150–400)
RBC: 4.31 MIL/uL (ref 3.87–5.11)
RDW: 17.2 % — ABNORMAL HIGH (ref 11.5–15.5)
WBC: 11.9 10*3/uL — ABNORMAL HIGH (ref 4.0–10.5)
nRBC: 0 % (ref 0.0–0.2)

## 2021-02-01 NOTE — ED Triage Notes (Signed)
Patient c/o feeling tired, having dizziness,and weak. Patient stated she left before she got seen yesterday.

## 2021-02-01 NOTE — Discharge Instructions (Addendum)
Please continue to monitor your symptoms closely.  If you develop any new or worsening symptoms please come back to the emergency department immediately.  It was a pleasure to meet you.

## 2021-02-01 NOTE — ED Provider Notes (Signed)
[facility] DEPT Provider Note   CSN: [id number] Arrival date & time: 02/01/21  1318     History Chief Complaint  Patient presents with   Fatigue    [name] is a 30 y.o. female.  HPI Patient is a 30 year old female with a medical history as noted below.  She presents to the emergency department due to fatigue, weakness, nausea/vomiting.  Patient states her last bout of nausea/vomiting was 2 days ago.  Currently tolerating p.o. intake.  States that she has continued to have weakness and fatigue.  Denies any fevers, chest pain, shortness of breath, abdominal pain, diarrhea, urinary complaints, cough, sore throat, rhinorrhea.  States she is currently on her menstrual cycle.    Past Medical History:  Diagnosis Date   Anxiety    Bipolar 1 disorder ([place])    Cognitive deficits    Depression    Diabetes mellitus without complication ([place])    Hypertension    Mental disorder    Mental health disorder    Obesity     Patient Active Problem List   Diagnosis Date Noted   Syphilis 07/15/2020   Malingering 06/05/2020   Gastroesophageal reflux disease 05/04/2020   Hyperglycemia due to type 2 diabetes mellitus ([place]) 05/04/2020   Long term (current) use of insulin ([place]) 05/04/2020   Migraine without aura 05/04/2020   Morbid obesity ([place]) 05/04/2020   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 2 diabetes mellitus ([place]) 05/04/2020   Prolapsed internal hemorrhoids 05/04/2020   Vitamin D deficiency 05/04/2020   Other symptoms and signs involving cognitive functions and awareness 05/04/2020   Suicide attempt ([place])    Anxiety state 03/06/2020   Schizophrenia ([place]) 09/13/2019   Bipolar I disorder, most recent episode depressed ([place]) 06/23/2019   MDD (major depressive disorder) 10/10/2018   Schizoaffective disorder, bipolar type ([place]) 09/25/2018   Affective psychosis, bipolar ([place]) 06/13/2018   HTN (hypertension) 05/03/2018   Tobacco use disorder 05/03/2018    Adjustment disorder with emotional disturbance 01/02/2018   Schizophrenia, 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   Moderate bipolar I disorder, most recent episode depressed ([place])    Psychosis ([place])    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/2017   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   OCD (obsessive compulsive disorder) 10/05/2016   Major depressive disorder, recurrent episode, mild ([place]) 05/04/2016   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   Learning disability 07/18/2015   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/2015   Diabetes mellitus ([place]) 07/18/2015   MDD (major depressive disorder), recurrent, severe, 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   Hyperlipidemia 07/18/2015   Severe episode of recurrent major depressive disorder, without psychotic features ([place])    Suicidal ideation    Drug overdose    Cognitive deficits 10/12/2012   Generalized anxiety disorder 06/28/2012    Past Surgical History:  Procedure Laterality Date   CESAREAN SECTION     CESAREAN SECTION N/A 04/25/2013   Procedure: REPEAT CESAREAN SECTION;  Surgeon: [name], MD;  Location: [facility];  Service: Obstetrics;  Laterality: N/A;   MASS EXCISION N/A 06/03/2012   Procedure: EXCISION MASS;  Surgeon: [name], MD;  Location: [facility];  Service: ENT;  Laterality: N/A;  Excision uvula mass   TONSILLECTOMY N/A 06/03/2012   Procedure: TONSILLECTOMY;  Surgeon: [name], MD;  Location: [facility];  Service: ENT;  Laterality: N/A;   TONSILLECTOMY       OB History     Gravida  3   Para  3  Term  3   Preterm  0   AB  0   Living  3      SAB  0   IAB  0   Ectopic  0   Multiple      Live Births  3           Family History  Problem Relation Age of Onset   Hypertension Mother    Diabetes Father     Social History   Tobacco Use   Smoking status: Every Day    Types: Cigars   Smokeless tobacco: Never   Tobacco comments:    Pt declined   Vaping Use   Vaping Use: Never used  Substance Use Topics   Alcohol use: Not Currently   Drug use: Not Currently    Types: "Crack" cocaine, Other-see comments    Comment: Patient reports hx of smoking Crack    Home Medications Prior to Admission medications   Medication Sig Start Date End Date Taking? Authorizing Provider  acetaminophen (TYLENOL) 500 MG tablet Take 1,000 mg by mouth every 6 (six) hours as needed for mild pain or headache.    [provider]  albuterol (VENTOLIN HFA) 108 (90 Base) MCG/ACT inhaler Inhale 1-2 puffs into the lungs every 6 (six) hours as needed for wheezing or shortness of breath. 01/11/21   Pearson Forster, NP  amLODipine (NORVASC) 5 MG tablet Take 5 mg by mouth daily.    [provider]  benztropine (COGENTIN) 1 MG tablet Take 1 mg by mouth 2 (two) times daily. 05/01/20   [provider]  cholecalciferol (VITAMIN D) 25 MCG (1000 UNIT) tablet Take 1,000 Units by mouth daily. 04/30/20   [provider]  divalproex (DEPAKOTE ER) 500 MG 24 hr tablet Take 1,000 mg by mouth in the morning and at bedtime.    [provider]  fluticasone (FLONASE) 50 MCG/ACT nasal spray Place 2 sprays into both nostrils daily. 01/11/21   Pearson Forster, NP  glimepiride (AMARYL) 4 MG tablet Take 4 mg by mouth daily.    [provider]  haloperidol (HALDOL) 10 MG tablet Take 10-20 mg by mouth 2 (two) times daily. 10mg  daily and 20mg  at bedtime. 05/09/20   [provider]  haloperidol decanoate (HALDOL DECANOATE) 100 MG/ML injection Inject 150 mg into the muscle every 28 (twenty-eight) days. Inject 150mg  (1.19ml) every 28 days.    [provider]  hydrOXYzine (ATARAX/VISTARIL) 50 MG tablet Take 50 mg by mouth at bedtime as needed for anxiety.    [provider]  insulin detemir (LEVEMIR) 100 UNIT/ML injection Inject 0.4 mLs (40 Units total) into the skin 2 (two) times daily. 05/05/20   Montine Circle, PA-C  lithium  carbonate (ESKALITH) 450 MG CR tablet Take 450 mg by mouth daily.    [provider]  omeprazole (PRILOSEC) 20 MG capsule Take 20 mg by mouth daily.    [provider]  ondansetron (ZOFRAN ODT) 4 MG disintegrating tablet Take 1 tablet (4 mg total) by mouth every 8 (eight) hours as needed for nausea or vomiting. 01/28/21   Volney American, PA-C  prazosin (MINIPRESS) 5 MG capsule Take 5 mg by mouth at bedtime.    [provider]  primidone (MYSOLINE) 50 MG tablet Take 100 mg by mouth 3 (three) times daily.    [provider]  simvastatin (ZOCOR) 20 MG tablet Take 20 mg by mouth daily. Patient not taking: Reported on 11/29/2020  [provider]  vitamin B-12 (CYANOCOBALAMIN) 1000 MCG tablet Take 1,000 mcg by mouth daily. Patient not taking: Reported on 11/29/2020 11/13/19   [provider]    Allergies    Wellbutrin [bupropion], Omnipaque [iohexol], Contrast media [iodinated diagnostic agents], Depakote [divalproex sodium], Hydroxyzine, and Penicillin g  Review of Systems   Review of Systems  All other systems reviewed and are negative. Ten systems reviewed and are negative for acute change, except as noted in the HPI.   Physical Exam Updated Vital Signs BP (!) 164/110 (BP Location: Right Arm)   Pulse (!) 129   Temp 99 F (37.2 C) (Oral)   Resp 18   Ht 5\' 7"  (1.702 m)   Wt 117.9 kg   LMP 01/31/2021   SpO2 95%   BMI 40.72 kg/m   Physical Exam Vitals and nursing note reviewed.  Constitutional:      General: She is not in acute distress.    Appearance: Normal appearance. She is not ill-appearing, toxic-appearing or diaphoretic.  HENT:     Head: Normocephalic and atraumatic.     Right Ear: External ear normal.     Left Ear: External ear normal.     Nose: Nose normal.     Mouth/Throat:     Pharynx: Oropharynx is clear.  Eyes:     General: No scleral icterus.       Right eye: No discharge.        Left eye: No discharge.      Extraocular Movements: Extraocular movements intact.     Conjunctiva/sclera: Conjunctivae normal.  Cardiovascular:     Rate and Rhythm: Regular rhythm. Tachycardia present.     Pulses: Normal pulses.     Heart sounds: Normal heart sounds. No murmur heard.   No friction rub. No gallop.  Pulmonary:     Effort: Pulmonary effort is normal. No respiratory distress.     Breath sounds: Normal breath sounds. No stridor. No wheezing, rhonchi or rales.  Abdominal:     General: Abdomen is flat.     Palpations: Abdomen is soft.     Tenderness: There is no abdominal tenderness.  Musculoskeletal:        General: Normal range of motion.     Cervical back: Normal range of motion and neck supple. No tenderness.  Skin:    General: Skin is warm and dry.  Neurological:     General: No focal deficit present.     Mental Status: She is alert and oriented to person, place, and time.  Psychiatric:        Mood and Affect: Mood normal.        Behavior: Behavior normal.   ED Results / Procedures / Treatments   Labs (all labs ordered are listed, but only abnormal results are displayed) Labs Reviewed  BASIC METABOLIC PANEL - Abnormal; Notable for the following components:      Result Value   Glucose, Bld 186 (*)    All other components within normal limits  CBC - Abnormal; Notable for the following components:   WBC 11.9 (*)    Hemoglobin 10.8 (*)    HCT 34.9 (*)    MCH 25.1 (*)    RDW 17.2 (*)    All other components within normal limits  URINALYSIS, ROUTINE W REFLEX MICROSCOPIC - Abnormal; Notable for the following components:   Color, Urine AMBER (*)    APPearance HAZY (*)    Specific Gravity, Urine 1.033 (*)    Glucose,  UA 50 (*)    Hgb urine dipstick LARGE (*)    Ketones, ur 20 (*)    Protein, ur 100 (*)    RBC / HPF >50 (*)    Bacteria, UA RARE (*)    All other components within normal limits  CBG MONITORING, ED - Abnormal; Notable for the following components:   Glucose-Capillary 182  (*)    All other components within normal limits  RESP PANEL BY RT-PCR (FLU A&B, COVID) ARPGX2    EKG None  Radiology No results found.  Procedures Procedures   Medications Ordered in ED Medications - No data to display  ED Course  I have reviewed the triage vital signs and the nursing notes.  Pertinent labs & imaging results that were available during my care of the patient were reviewed by me and considered in my medical decision making (see chart for details).  Clinical Course as of 02/01/21 1523  Sat Feb 01, 2021  1511 RBC / HPF(!): >50 Currently menstruating  [LJ]    Clinical Course User Index [LJ] Stacy Sexton, PA-C   MDM Rules/Calculators/A&P                          Pt is a 30 y.o. female who presents to the emergency department due to fatigue with intermittent nausea/vomiting.  Labs: CBC with a white count of 11.9, hemoglobin of 10.8, hematocrit of 34.9, RDW of 17.2. BMP with a glucose of 186. Respiratory panel is negative. UA with 50 glucose, large hemoglobin, 20 ketones, 100 protein, greater than 50 red blood cells, rare bacteria.  I, Stacy Sexton, PA-C, personally reviewed and evaluated these images and lab results as part of my medical decision-making.  Patient's physical exam appears mostly reassuring.  She has mildly tachycardic on my exam.  Lungs are clear to auscultation bilaterally.  Abdomen is soft and nontender.  Moving all 4 extremities with ease.  Speaking clearly and coherently.    Unsure the source of her symptoms today.  Likely viral in nature.  She was evaluated in triage yesterday and left before being seen by her provider.  She does have a mild leukocytosis at 11.9, though this appears improved compared to her lab values yesterday.  Hemoglobin near baseline at 10.8.  UA does not appear infectious.  Respiratory panel is negative.  Patient states that she was experiencing nausea/vomiting initially but this is resolved and she has had no  nausea/vomiting for the past 2 days.  She has not drink at bedside and is currently tolerating p.o. without difficulty.  Feel that she is stable for discharge at this time and she is agreeable.  We discussed return precautions.  Her questions were answered and she was amicable at the time of discharge.  Note: Portions of this report may have been transcribed using voice recognition software. Every effort was made to ensure accuracy; however, inadvertent computerized transcription errors may be present.   Final Clinical Impression(s) / ED Diagnoses Final diagnoses:  Non-intractable vomiting with nausea   Rx / DC Orders ED Discharge Orders     None        Stacy Sexton, PA-C 02/01/21 [place], MD 02/12/21 [phone]

## 2021-02-01 NOTE — ED Provider Notes (Signed)
Emergency Medicine Provider Triage Evaluation Note  [name] , a 30 y.o. female  was evaluated in triage.  Pt complains of fatigue.  Review of Systems  Positive: Weak, n/v Negative: Fever, cp, sob, abd pain  Physical Exam  BP (!) 164/110 (BP Location: Right Arm)   Pulse (!) 129   Temp 99 F (37.2 C) (Oral)   Resp 18   Ht 5\' 7"  (1.702 m)   Wt 117.9 kg   LMP 01/31/2021   SpO2 95%   BMI 40.72 kg/m  Gen:   Awake, no distress   Resp:  Normal effort  MSK:   Moves extremities without difficulty  Other:  tachycardic  Medical Decision Making  Medically screening exam initiated at 1:38 PM.  Appropriate orders placed.  Maudy Locklyn Henriquez was informed that the remainder of the evaluation will be completed by another provider, this initial triage assessment does not replace that evaluation, and the importance of remaining in the ED until their evaluation is complete.  Pt report feeling weak and fatigue x 5 days.  Has had n/v and dizziness.  Pt has a care plan.     Domenic Moras, PA-C 02/01/21 1346    Horton, Alvin Critchley, DO 02/01/21 1610

## 2021-02-03 ENCOUNTER — Ambulatory Visit (HOSPITAL_COMMUNITY)
Admission: EM | Admit: 2021-02-03 | Discharge: 2021-02-03 | Disposition: A | Discharge status: Home / Self Care | Payer: Medicaid Other | Attending: Physician Assistant | Admitting: Physician Assistant

## 2021-02-03 ENCOUNTER — Other Ambulatory Visit: Payer: Self-pay

## 2021-02-03 ENCOUNTER — Encounter (HOSPITAL_COMMUNITY): Payer: Self-pay

## 2021-02-03 DIAGNOSIS — B370 Candidal stomatitis: Secondary | ICD-10-CM | POA: Diagnosis not present

## 2021-02-03 MED ORDER — NYSTATIN 100000 UNIT/ML MT SUSP: 5.0000 mL | Freq: Four times a day (QID) | OROMUCOSAL | 0 refills | Status: AC

## 2021-02-03 NOTE — Discharge Instructions (Addendum)
Use oral mouthwash four times per day Return if no improvement.  Follow up with primary care

## 2021-02-03 NOTE — ED Provider Notes (Signed)
[place]    CSN: [id number] Arrival date & time: 02/03/21  1516      History   Chief Complaint Chief Complaint  Patient presents with   Mouth Lesions    HPI [name] is a 30 y.o. female.   Pt complains of painful white patches to her tongue that started about five days ago.  She has tried nothing for the sx.  She reports similar sx a few months ago and was treated for oral thrush with nystatin with resolution.     Past Medical History:  Diagnosis Date   Anxiety    Bipolar 1 disorder ([place])    Cognitive deficits    Depression    Diabetes mellitus without complication ([place])    Hypertension    Mental disorder    Mental health disorder    Obesity     Patient Active Problem List   Diagnosis Date Noted   Syphilis 07/15/2020   Malingering 06/05/2020   Gastroesophageal reflux disease 05/04/2020   Hyperglycemia due to type 2 diabetes mellitus ([place]) 05/04/2020   Long term (current) use of insulin ([place]) 05/04/2020   Migraine without aura 05/04/2020   Morbid obesity ([place]) 05/04/2020   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 2 diabetes mellitus ([place]) 05/04/2020   Prolapsed internal hemorrhoids 05/04/2020   Vitamin D deficiency 05/04/2020   Other symptoms and signs involving cognitive functions and awareness 05/04/2020   Suicide attempt ([place])    Anxiety state 03/06/2020   Schizophrenia ([place]) 09/13/2019   Bipolar I disorder, most recent episode depressed ([place]) 06/23/2019   MDD (major depressive disorder) 10/10/2018   Schizoaffective disorder, bipolar type ([place]) 09/25/2018   Affective psychosis, bipolar ([place]) 06/13/2018   HTN (hypertension) 05/03/2018   Tobacco use disorder 05/03/2018   Adjustment disorder with emotional disturbance 01/02/2018   Schizophrenia, 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   Moderate bipolar I disorder, most recent episode depressed ([place])    Psychosis ([place])    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/2017   Cervix  dysplasia 02/01/2017   OCD (obsessive compulsive disorder) 10/05/2016   Major depressive disorder, recurrent episode, mild ([place]) 05/04/2016   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   Learning disability 07/18/2015   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/2015   Diabetes mellitus ([place]) 07/18/2015   MDD (major depressive disorder), recurrent, severe, 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/2015   Hyperlipidemia 07/18/2015   Severe episode of recurrent major depressive disorder, without psychotic features ([place])    Suicidal ideation    Drug overdose    Cognitive deficits 10/12/2012   Generalized anxiety disorder 06/28/2012    Past Surgical History:  Procedure Laterality Date   CESAREAN SECTION     CESAREAN SECTION N/A 04/25/2013   Procedure: REPEAT CESAREAN SECTION;  Surgeon: [name], MD;  Location: [facility];  Service: Obstetrics;  Laterality: N/A;   MASS EXCISION N/A 06/03/2012   Procedure: EXCISION MASS;  Surgeon: [name], MD;  Location: [facility];  Service: ENT;  Laterality: N/A;  Excision uvula mass   TONSILLECTOMY N/A 06/03/2012   Procedure: TONSILLECTOMY;  Surgeon: [name], MD;  Location: [facility];  Service: ENT;  Laterality: N/A;   TONSILLECTOMY      OB History     Gravida  3   Para  3   Term  3   Preterm  0   AB  0   Living  3      SAB  0   IAB  0  Ectopic  0   Multiple      Live Births  3            Home Medications    Prior to Admission medications   Medication Sig Start Date End Date Taking? Authorizing Provider  nystatin (MYCOSTATIN) 100000 UNIT/ML suspension Take 5 mLs (500,000 Units total) by mouth 4 (four) times daily for 7 days. 02/03/21 02/10/21 Yes Ward, Lenise Arena, PA-C  acetaminophen (TYLENOL) 500 MG tablet Take 1,000 mg by mouth every 6 (six) hours as needed for mild pain or headache.    [provider]  albuterol (VENTOLIN HFA) 108 (90 Base) MCG/ACT inhaler Inhale 1-2 puffs into the lungs  every 6 (six) hours as needed for wheezing or shortness of breath. 01/11/21   Pearson Forster, NP  amLODipine (NORVASC) 5 MG tablet Take 5 mg by mouth daily.    [provider]  benztropine (COGENTIN) 1 MG tablet Take 1 mg by mouth 2 (two) times daily. 05/01/20   [provider]  cholecalciferol (VITAMIN D) 25 MCG (1000 UNIT) tablet Take 1,000 Units by mouth daily. 04/30/20   [provider]  divalproex (DEPAKOTE ER) 500 MG 24 hr tablet Take 1,000 mg by mouth in the morning and at bedtime.    [provider]  fluticasone (FLONASE) 50 MCG/ACT nasal spray Place 2 sprays into both nostrils daily. 01/11/21   Pearson Forster, NP  glimepiride (AMARYL) 4 MG tablet Take 4 mg by mouth daily.    [provider]  haloperidol (HALDOL) 10 MG tablet Take 10-20 mg by mouth 2 (two) times daily. 10mg  daily and 20mg  at bedtime. 05/09/20   [provider]  haloperidol decanoate (HALDOL DECANOATE) 100 MG/ML injection Inject 150 mg into the muscle every 28 (twenty-eight) days. Inject 150mg  (1.56ml) every 28 days.    [provider]  hydrOXYzine (ATARAX/VISTARIL) 50 MG tablet Take 50 mg by mouth at bedtime as needed for anxiety.    [provider]  insulin detemir (LEVEMIR) 100 UNIT/ML injection Inject 0.4 mLs (40 Units total) into the skin 2 (two) times daily. 05/05/20   Montine Circle, PA-C  lithium carbonate (ESKALITH) 450 MG CR tablet Take 450 mg by mouth daily.    [provider]  omeprazole (PRILOSEC) 20 MG capsule Take 20 mg by mouth daily.    [provider]  ondansetron (ZOFRAN ODT) 4 MG disintegrating tablet Take 1 tablet (4 mg total) by mouth every 8 (eight) hours as needed for nausea or vomiting. 01/28/21   Volney American, PA-C  prazosin (MINIPRESS) 5 MG capsule Take 5 mg by mouth at bedtime.    [provider]  primidone (MYSOLINE) 50 MG tablet Take 100 mg by mouth 3 (three) times daily.    [provider]  simvastatin (ZOCOR) 20 MG tablet Take 20 mg by mouth daily. Patient not taking: Reported on 11/29/2020    [provider]  vitamin B-12 (CYANOCOBALAMIN) 1000 MCG tablet Take 1,000 mcg by mouth daily. Patient not taking: Reported on 11/29/2020 11/13/19   [provider]    Family History Family History  Problem Relation Age of Onset   Hypertension Mother    Diabetes Father     Social History Social History   Tobacco Use   Smoking status: Every Day    Types: Cigars   Smokeless tobacco: Never   Tobacco comments:    Pt declined  Vaping Use   Vaping Use: Never used  Substance Use  Topics   Alcohol use: Not Currently   Drug use: Not Currently    Types: "Crack" cocaine, Other-see comments    Comment: Patient reports hx of smoking Crack     Allergies   Wellbutrin [bupropion], Omnipaque [iohexol], Contrast media [iodinated diagnostic agents], Depakote [divalproex sodium], Hydroxyzine, and Penicillin g   Review of Systems Review of Systems  Constitutional:  Negative for chills and fever.  HENT:  Negative for ear pain and sore throat.        Painful tongue  Eyes:  Negative for pain and visual disturbance.  Respiratory:  Negative for cough and shortness of breath.   Cardiovascular:  Negative for chest pain and palpitations.  Gastrointestinal:  Negative for abdominal pain and vomiting.  Genitourinary:  Negative for dysuria and hematuria.  Musculoskeletal:  Negative for arthralgias and back pain.  Skin:  Negative for color change and rash.  Neurological:  Negative for seizures and syncope.  All other systems reviewed and are negative.   Physical Exam Triage Vital Signs ED Triage Vitals  Enc Vitals Group     BP 02/03/21 1729 (!) 149/98     Pulse Rate 02/03/21 1729 99     Resp 02/03/21 1729 18     Temp 02/03/21 1729 99 F (37.2 C)     Temp Source 02/03/21 1729 Oral     SpO2 02/03/21 1729 97 %     Weight --      Height --      Head Circumference --       Peak Flow --      Pain Score 02/03/21 1732 6     Pain Loc --      Pain Edu? --      Excl. in [place]? --    No data found.  Updated Vital Signs BP (!) 149/98 (BP Location: Left Arm)   Pulse 99   Temp 99 F (37.2 C) (Oral)   Resp 18   LMP 01/31/2021   SpO2 97%   Visual Acuity Right Eye Distance:   Left Eye Distance:   Bilateral Distance:    Right Eye Near:   Left Eye Near:    Bilateral Near:     Physical Exam Vitals and nursing note reviewed.  Constitutional:      General: She is not in acute distress.    Appearance: She is well-developed.  HENT:     Head: Normocephalic and atraumatic.     Mouth/Throat:     Comments: White plaques with erythematous base to tongue Eyes:     Conjunctiva/sclera: Conjunctivae normal.  Cardiovascular:     Rate and Rhythm: Normal rate and regular rhythm.     Heart sounds: No murmur heard. Pulmonary:     Effort: Pulmonary effort is normal. No respiratory distress.     Breath sounds: Normal breath sounds.  Abdominal:     Palpations: Abdomen is soft.     Tenderness: There is no abdominal tenderness.  Musculoskeletal:     Cervical back: Neck supple.  Skin:    General: Skin is warm and dry.  Neurological:     Mental Status: She is alert.     UC Treatments / Results  Labs (all labs ordered are listed, but only abnormal results are displayed) Labs Reviewed - No data to display  EKG   Radiology No results found.  Procedures Procedures (including critical care time)  Medications Ordered in UC Medications - No data to display  Initial Impression / Assessment and Plan /  UC Course  I have reviewed the triage vital signs and the nursing notes.  Pertinent labs & imaging results that were available during my care of the patient were reviewed by me and considered in my medical decision making (see chart for details).     Recurrent oral thrush.  Nystatin prescribed, instructions given.  Advised follow up with PCP.  Final Clinical  Impressions(s) / UC Diagnoses   Final diagnoses:  Oral thrush     Discharge Instructions      Use oral mouthwash four times per day Return if no improvement.  Follow up with primary care   ED Prescriptions     Medication Sig Dispense Auth. Provider   nystatin (MYCOSTATIN) 100000 UNIT/ML suspension Take 5 mLs (500,000 Units total) by mouth 4 (four) times daily for 7 days. 60 mL Ward, Lenise Arena, PA-C      PDMP not reviewed this encounter.   Ward, Lenise Arena, PA-C 02/03/21 [phone]

## 2021-02-03 NOTE — ED Triage Notes (Signed)
Pt presents with painful [name] patch on tongue X 5 days.

## 2021-02-21 ENCOUNTER — Encounter (HOSPITAL_COMMUNITY): Payer: Self-pay | Admitting: Emergency Medicine

## 2021-02-21 ENCOUNTER — Emergency Department (HOSPITAL_COMMUNITY)
Admission: EM | Admit: 2021-02-21 | Discharge: 2021-02-24 | Disposition: A | Discharge status: Other Acute Inpatient Hospital | Payer: Medicaid Other | Attending: Emergency Medicine | Admitting: Emergency Medicine

## 2021-02-21 DIAGNOSIS — F419 Anxiety disorder, unspecified: Secondary | ICD-10-CM | POA: Insufficient documentation

## 2021-02-21 DIAGNOSIS — Z794 Long term (current) use of insulin: Secondary | ICD-10-CM | POA: Insufficient documentation

## 2021-02-21 DIAGNOSIS — I10 Essential (primary) hypertension: Secondary | ICD-10-CM | POA: Diagnosis not present

## 2021-02-21 DIAGNOSIS — R451 Restlessness and agitation: Secondary | ICD-10-CM | POA: Diagnosis not present

## 2021-02-21 DIAGNOSIS — Z20822 Contact with and (suspected) exposure to covid-19: Secondary | ICD-10-CM | POA: Diagnosis not present

## 2021-02-21 DIAGNOSIS — Z79899 Other long term (current) drug therapy: Secondary | ICD-10-CM | POA: Insufficient documentation

## 2021-02-21 DIAGNOSIS — F250 Schizoaffective disorder, bipolar type: Secondary | ICD-10-CM | POA: Diagnosis not present

## 2021-02-21 DIAGNOSIS — E1142 Type 2 diabetes mellitus with diabetic polyneuropathy: Secondary | ICD-10-CM | POA: Insufficient documentation

## 2021-02-21 DIAGNOSIS — F17290 Nicotine dependence, other tobacco product, uncomplicated: Secondary | ICD-10-CM | POA: Diagnosis not present

## 2021-02-21 DIAGNOSIS — F333 Major depressive disorder, recurrent, severe with psychotic symptoms: Secondary | ICD-10-CM | POA: Diagnosis present

## 2021-02-21 DIAGNOSIS — R45851 Suicidal ideations: Secondary | ICD-10-CM | POA: Diagnosis not present

## 2021-02-21 LAB — COMPREHENSIVE METABOLIC PANEL
ALT: 17 U/L (ref 0–44)
AST: 21 U/L (ref 15–41)
Albumin: 3.9 g/dL (ref 3.5–5.0)
Alkaline Phosphatase: 59 U/L (ref 38–126)
Anion gap: 8 (ref 5–15)
BUN: 9 mg/dL (ref 6–20)
CO2: 22 mmol/L (ref 22–32)
Calcium: 8.8 mg/dL — ABNORMAL LOW (ref 8.9–10.3)
Chloride: 107 mmol/L (ref 98–111)
Creatinine, Ser: 0.85 mg/dL (ref 0.44–1.00)
GFR, Estimated: >60 mL/min (ref >60)
Glucose, Bld: 119 mg/dL — ABNORMAL HIGH (ref 70–99)
Potassium: 3.9 mmol/L (ref 3.5–5.1)
Sodium: 137 mmol/L (ref 135–145)
Total Bilirubin: 0.6 mg/dL (ref 0.3–1.2)
Total Protein: 7.1 g/dL (ref 6.5–8.1)

## 2021-02-21 LAB — CBC
HCT: 38.9 % (ref 36.0–46.0)
Hemoglobin: 12 g/dL (ref 12.0–15.0)
MCH: 25.2 pg — ABNORMAL LOW (ref 26.0–34.0)
MCHC: 30.8 g/dL (ref 30.0–36.0)
MCV: 81.6 fL (ref 80.0–100.0)
Platelets: 237 10*3/uL (ref 150–400)
RBC: 4.77 MIL/uL (ref 3.87–5.11)
RDW: 16.1 % — ABNORMAL HIGH (ref 11.5–15.5)
WBC: 11.4 10*3/uL — ABNORMAL HIGH (ref 4.0–10.5)
nRBC: 0 % (ref 0.0–0.2)

## 2021-02-21 LAB — RESP PANEL BY RT-PCR (FLU A&B, COVID) ARPGX2
Influenza A by PCR: NEGATIVE
Influenza B by PCR: NEGATIVE
SARS Coronavirus 2 by RT PCR: NEGATIVE

## 2021-02-21 LAB — RAPID URINE DRUG SCREEN, HOSP PERFORMED
Amphetamines: NOT DETECTED
Barbiturates: POSITIVE — AB
Benzodiazepines: NOT DETECTED
Cocaine: NOT DETECTED
Opiates: NOT DETECTED
Tetrahydrocannabinol: POSITIVE — AB

## 2021-02-21 LAB — I-STAT BETA HCG BLOOD, ED (MC, WL, AP ONLY): I-stat hCG, quantitative: <5 m[IU]/mL (ref <5)

## 2021-02-21 LAB — SALICYLATE LEVEL: Salicylate Lvl: <7 mg/dL — ABNORMAL LOW (ref 7.0–30.0)

## 2021-02-21 LAB — ACETAMINOPHEN LEVEL: Acetaminophen (Tylenol), Serum: <10 ug/mL — ABNORMAL LOW (ref 10–30)

## 2021-02-21 LAB — ETHANOL: Alcohol, Ethyl (B): <10 mg/dL (ref <10)

## 2021-02-21 MED ORDER — OLANZAPINE 5 MG PO TBDP
10.0000 mg | ORAL_TABLET | Freq: Three times a day (TID) | ORAL | Status: DC | PRN
  Administered 2021-02-22 – 2021-02-23 (×3): 10 mg via ORAL
  Filled 2021-02-21 (×3): qty 2

## 2021-02-21 MED ORDER — LORAZEPAM 1 MG PO TABS
2.0000 mg | ORAL_TABLET | Freq: Once | ORAL | Status: AC
  Administered 2021-02-21: 2 mg via ORAL
  Filled 2021-02-21: qty 2

## 2021-02-21 MED ORDER — LORAZEPAM 1 MG PO TABS
1.0000 mg | ORAL_TABLET | ORAL | Status: AC | PRN
Start: 2021-02-21 — End: 2021-02-22
  Administered 2021-02-22: 1 mg via ORAL
  Filled 2021-02-21: qty 1

## 2021-02-21 MED ORDER — ZIPRASIDONE MESYLATE 20 MG IM SOLR: 20.0000 mg | INTRAMUSCULAR | Status: DC | PRN

## 2021-02-21 MED ORDER — ONDANSETRON HCL 4 MG PO TABS: 4.0000 mg | ORAL_TABLET | Freq: Three times a day (TID) | ORAL | Status: DC | PRN

## 2021-02-21 MED ORDER — ZOLPIDEM TARTRATE 5 MG PO TABS
5.0000 mg | ORAL_TABLET | Freq: Every evening | ORAL | Status: DC | PRN
  Filled 2021-02-21: qty 1

## 2021-02-21 MED ORDER — ACETAMINOPHEN 325 MG PO TABS: 650.0000 mg | ORAL_TABLET | ORAL | Status: DC | PRN

## 2021-02-21 NOTE — ED Provider Notes (Signed)
Emergency Medicine Provider Triage Evaluation Note  [name] , a 30 y.o. female  was evaluated in triage.  Pt complains of hallucinations.  She reports that she is suicidal.  She feels like her meds aren't working.  She reports that she has a plan to swallow her pills.  Feels like meds aren't working.  No missed doses of her pills.    Review of Systems  Positive: SI, hallucaintions Negative: Headache, syncope  Physical Exam  BP (!) 161/101 (BP Location: Right Arm)   Pulse 85   Temp 99.2 F (37.3 C) (Oral)   Resp 20   LMP 01/31/2021   SpO2 100%  Gen:   Awake, no distress   Resp:  Normal effort  MSK:   Moves extremities without difficulty  Other:  Reports SI with plan to to kill her self by OD on her meds .  Medical Decision Making  Medically screening exam initiated at 4:40 PM.  Appropriate orders placed.  [name] was informed that the remainder of the evaluation will be completed by another provider, this initial triage assessment does not replace that evaluation, and the importance of remaining in the ED until their evaluation is complete.  Note: Portions of this report may have been transcribed using voice recognition software. Every effort was made to ensure accuracy; however, inadvertent computerized transcription errors may be present    Lorin Glass, PA-C 02/21/21 1643    Lucrezia Starch, MD 02/22/21 [id number]

## 2021-02-21 NOTE — ED Triage Notes (Addendum)
Patient here for evaluation of hallucinations and paranoia. Patient states she does think she psych medication is working anymore. Reports seeing her enemies everywhere she goes, arguing with people, and thinking that people are following her. Patient states this started a few months ago. Patient alert, oriented, and in no apparent distress at this time.

## 2021-02-21 NOTE — ED Notes (Signed)
Received verbal report from Bailey B RN at this time 

## 2021-02-21 NOTE — ED Notes (Signed)
Pt moved to room at this time 

## 2021-02-21 NOTE — ED Provider Notes (Signed)
[facility] EMERGENCY DEPARTMENT Provider Note   CSN: [id number] Arrival date & time: 02/21/21  1512     History Chief Complaint  Patient presents with   Hallucinations    [name] is a 30 y.o. female.  with past medical history of bipolar 1 disorder, schizophrenia, MDD, hx of intentional drug overdose who presents to the emergency department with suicidal ideations.   Patient states that she is "tired of these people following me around." She describes seeing her ex-boyfriend and another woman "everywhere" she goes. She states someone at work told her "those people may not be there," but she does not believe that. She also states her "psych meds aren't working" and she is going to "take all of her medications," with the intent of killing herself. She denies auditory hallucinations. When asked about homicidal ideations she states, "I don't know."   Right after interview, patient becomes upset, crying and screaming that she needs help and for someone to fix her medicines.   HPI     Past Medical History:  Diagnosis Date   Anxiety    Bipolar 1 disorder ([place])    Cognitive deficits    Depression    Diabetes mellitus without complication ([place])    Hypertension    Mental disorder    Mental health disorder    Obesity     Patient Active Problem List   Diagnosis Date Noted   Syphilis 07/15/2020   Malingering 06/05/2020   Gastroesophageal reflux disease 05/04/2020   Hyperglycemia due to type 2 diabetes mellitus ([place]) 05/04/2020   Long term (current) use of insulin ([place]) 05/04/2020   Migraine without aura 05/04/2020   Morbid obesity ([place]) 05/04/2020   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 2 diabetes mellitus ([place]) 05/04/2020   Prolapsed internal hemorrhoids 05/04/2020   Vitamin D deficiency 05/04/2020   Other symptoms and signs involving cognitive functions and awareness 05/04/2020   Suicide attempt ([place])    Anxiety state 03/06/2020   Schizophrenia ([place])  09/13/2019   Bipolar I disorder, most recent episode depressed ([place]) 06/23/2019   MDD (major depressive disorder) 10/10/2018   Schizoaffective disorder, bipolar type ([place]) 09/25/2018   Affective psychosis, bipolar ([place]) 06/13/2018   HTN (hypertension) 05/03/2018   Tobacco use disorder 05/03/2018   Adjustment disorder with emotional disturbance 01/02/2018   Schizophrenia, 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   Moderate bipolar I disorder, most recent episode depressed ([place])    Psychosis ([place])    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/2017   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   OCD (obsessive compulsive disorder) 10/05/2016   Major depressive disorder, recurrent episode, mild ([place]) 05/04/2016   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   Learning disability 07/18/2015   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/2015   Diabetes mellitus ([place]) 07/18/2015   MDD (major depressive disorder), recurrent, severe, 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   Hyperlipidemia 07/18/2015   Severe episode of recurrent major depressive disorder, without psychotic features ([place])    Suicidal ideation    Drug overdose    Cognitive deficits 10/12/2012   Generalized anxiety disorder 06/28/2012    Past Surgical History:  Procedure Laterality Date   CESAREAN SECTION     CESAREAN SECTION N/A 04/25/2013   Procedure: REPEAT CESAREAN SECTION;  Surgeon: [name], MD;  Location: [facility];  Service: Obstetrics;  Laterality: N/A;   MASS EXCISION N/A 06/03/2012   Procedure: EXCISION MASS;  Surgeon: [name], MD;  Location: [facility];  Service: ENT;  Laterality: N/A;  Excision  uvula mass   TONSILLECTOMY N/A 06/03/2012   Procedure: TONSILLECTOMY;  Surgeon: [name], MD;  Location: [facility];  Service: ENT;  Laterality: N/A;   TONSILLECTOMY       OB History     Gravida  3   Para  3   Term  3   Preterm  0   AB  0   Living  3      SAB  0   IAB  0    Ectopic  0   Multiple      Live Births  3           Family History  Problem Relation Age of Onset   Hypertension Mother    Diabetes Father     Social History   Tobacco Use   Smoking status: Every Day    Types: Cigars   Smokeless tobacco: Never   Tobacco comments:    Pt declined  Vaping Use   Vaping Use: Never used  Substance Use Topics   Alcohol use: Not Currently   Drug use: Not Currently    Types: "Crack" cocaine, Other-see comments    Comment: Patient reports hx of smoking Crack    Home Medications Prior to Admission medications   Medication Sig Start Date End Date Taking? Authorizing Provider  acetaminophen (TYLENOL) 500 MG tablet Take 1,000 mg by mouth every 6 (six) hours as needed for mild pain or headache.    [provider]  albuterol (VENTOLIN HFA) 108 (90 Base) MCG/ACT inhaler Inhale 1-2 puffs into the lungs every 6 (six) hours as needed for wheezing or shortness of breath. 01/11/21   Pearson Forster, NP  amLODipine (NORVASC) 5 MG tablet Take 5 mg by mouth daily.    [provider]  benztropine (COGENTIN) 1 MG tablet Take 1 mg by mouth 2 (two) times daily. 05/01/20   [provider]  cholecalciferol (VITAMIN D) 25 MCG (1000 UNIT) tablet Take 1,000 Units by mouth daily. 04/30/20   [provider]  divalproex (DEPAKOTE ER) 500 MG 24 hr tablet Take 1,000 mg by mouth in the morning and at bedtime.    [provider]  fluticasone (FLONASE) 50 MCG/ACT nasal spray Place 2 sprays into both nostrils daily. 01/11/21   Pearson Forster, NP  glimepiride (AMARYL) 4 MG tablet Take 4 mg by mouth daily.    [provider]  haloperidol (HALDOL) 10 MG tablet Take 10-20 mg by mouth 2 (two) times daily. 10mg  daily and 20mg  at bedtime. 05/09/20   [provider]  haloperidol decanoate (HALDOL DECANOATE) 100 MG/ML injection Inject 150 mg into the muscle every 28 (twenty-eight) days. Inject 150mg  (1.71ml) every 28 days.     [provider]  hydrOXYzine (ATARAX/VISTARIL) 50 MG tablet Take 50 mg by mouth at bedtime as needed for anxiety.    [provider]  insulin detemir (LEVEMIR) 100 UNIT/ML injection Inject 0.4 mLs (40 Units total) into the skin 2 (two) times daily. 05/05/20   Montine Circle, PA-C  lithium carbonate (ESKALITH) 450 MG CR tablet Take 450 mg by mouth daily.    [provider]  omeprazole (PRILOSEC) 20 MG capsule Take 20 mg by mouth daily.    [provider]  ondansetron (ZOFRAN ODT) 4 MG disintegrating tablet Take 1 tablet (4 mg total) by mouth every 8 (eight) hours as needed for nausea or vomiting. 01/28/21   Volney American, PA-C  prazosin (MINIPRESS) 5 MG capsule Take 5  mg by mouth at bedtime.    [provider]  primidone (MYSOLINE) 50 MG tablet Take 100 mg by mouth 3 (three) times daily.    [provider]  simvastatin (ZOCOR) 20 MG tablet Take 20 mg by mouth daily. Patient not taking: Reported on 11/29/2020    [provider]  vitamin B-12 (CYANOCOBALAMIN) 1000 MCG tablet Take 1,000 mcg by mouth daily. Patient not taking: Reported on 11/29/2020 11/13/19   [provider]    Allergies    Wellbutrin [bupropion], Omnipaque [iohexol], Contrast media [iodinated diagnostic agents], Depakote [divalproex sodium], Hydroxyzine, and Penicillin g  Review of Systems   Review of Systems  Psychiatric/Behavioral:  Positive for behavioral problems, hallucinations and suicidal ideas.   All other systems reviewed and are negative.  Physical Exam Updated Vital Signs BP (!) 161/101 (BP Location: Right Arm)   Pulse 85   Temp 99.2 F (37.3 C) (Oral)   Resp 20   LMP 01/31/2021   SpO2 100%   Physical Exam Vitals and nursing note reviewed.  Constitutional:      Appearance: Normal appearance. She is obese.  HENT:     Head: Normocephalic and atraumatic.  Eyes:     General: No scleral icterus.    Extraocular Movements: Extraocular  movements intact.     Pupils: Pupils are equal, round, and reactive to light.  Cardiovascular:     Rate and Rhythm: Normal rate and regular rhythm.     Pulses: Normal pulses.     Heart sounds: No murmur heard. Pulmonary:     Effort: Pulmonary effort is normal. No respiratory distress.     Breath sounds: Normal breath sounds.  Abdominal:     General: Bowel sounds are normal.     Palpations: Abdomen is soft.  Musculoskeletal:        General: Normal range of motion.     Cervical back: Normal range of motion.     Right lower leg: No edema.  Skin:    General: Skin is warm and dry.     Capillary Refill: Capillary refill takes less than 2 seconds.  Neurological:     General: No focal deficit present.     Mental Status: She is alert and oriented to person, place, and time.  Psychiatric:        Attention and Perception: She perceives visual hallucinations. She does not perceive auditory hallucinations.        Mood and Affect: Mood is anxious. Affect is labile, angry and tearful.        Speech: Speech normal.        Behavior: Behavior is agitated.        Thought Content: Thought content is paranoid and delusional. Thought content includes suicidal ideation. Thought content does not include homicidal ideation. Thought content includes suicidal plan. Thought content does not include homicidal plan.        Judgment: Judgment is impulsive and inappropriate.    ED Results / Procedures / Treatments   Labs (all labs ordered are listed, but only abnormal results are displayed) Labs Reviewed  RESP PANEL BY RT-PCR (FLU A&B, COVID) ARPGX2  COMPREHENSIVE METABOLIC PANEL  ETHANOL  SALICYLATE LEVEL  ACETAMINOPHEN LEVEL  CBC  RAPID URINE DRUG SCREEN, HOSP PERFORMED  I-STAT BETA HCG BLOOD, ED ([facility], [facility], [facility] ONLY)    EKG None  Radiology No results found.  Procedures Procedures   Medications Ordered in ED Medications  LORazepam (ATIVAN) tablet 2 mg (2 mg Oral Given 02/21/21 1741)  ED  Course  I have reviewed the triage vital signs and the nursing notes.  Pertinent labs & imaging results that were available during my care of the patient were reviewed by me and considered in my medical decision making (see chart for details).  1715: First exam and IVC paperwork completed.  Signed by Dr. Roslynn Amble MDM Rules/Calculators/A&P 30 year old female who presents to the emergency department with suicidal ideations.  Medical clearance labs placed 2 mg oral Ativan given for anxiety on presentation, which have improved her symptoms.  Patient care being handed off to Dr. Roslynn Amble at this time.  Pending medical clearance.  Dr. Roslynn Amble will place TTS orders.  Final Clinical Impression(s) / ED Diagnoses Final diagnoses:  Suicidal ideation    Rx / DC Orders ED Discharge Orders     None        Mickie Hillier, PA-C 02/21/21 1910    Lucrezia Starch, MD 02/21/21 2215

## 2021-02-21 NOTE — ED Notes (Signed)
Pt ambulated to restroom at this time.

## 2021-02-22 NOTE — ED Notes (Signed)
Pt used her fist 5-minute phone call. Calm and cooperative.

## 2021-02-22 NOTE — BH Assessment (Signed)
TTS clinician attempted TTS assessment. Per Vaughan Basta, RN, patient is asleep, unable to arouse for TTS assessment. TTS will attempt at later time.

## 2021-02-22 NOTE — ED Notes (Signed)
Attempted to call [facility] per pt's request. Pt's wanting to get evaluated asap and states "Im getting very impatient waiting, I've been here since yesterday".
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
## 2021-02-22 NOTE — ED Notes (Signed)
Pt awake ambulated to restroom and asked for something to eat and drink

## 2021-02-22 NOTE — ED Notes (Signed)
Pt using her 2nd 5-minute phone call at this time.

## 2021-02-22 NOTE — ED Notes (Signed)
TTS at bedside. 

## 2021-02-23 DIAGNOSIS — F250 Schizoaffective disorder, bipolar type: Secondary | ICD-10-CM

## 2021-02-23 LAB — CBG MONITORING, ED: Glucose-Capillary: 204 mg/dL — ABNORMAL HIGH (ref 70–99)

## 2021-02-23 NOTE — ED Notes (Signed)
Assisted pt with first phone call.

## 2021-02-23 NOTE — ED Notes (Signed)
Attempted to call every number given to this nurse to call report to [facility] but unable to connect. It rang and rang with no answer to main number [phone] error message received at direct number [phone]

## 2021-02-23 NOTE — ED Notes (Signed)
Patient at desk speaking with mother and advising of impending  transfer to [facility] in the AM-Monique,RN

## 2021-02-23 NOTE — ED Notes (Signed)
TTS in process at this time  

## 2021-02-23 NOTE — Progress Notes (Addendum)
Per Raven,RN, pt has been accepted to [facility]. Accepting provider is Dr. Kathlene Cote. Patient can arrive by anytime. Number for report is [phone]. Please fax IVC paperwork before calling report to fax#: [phone].   Glennie Isle, MSW, LCSW-A Phone: [phone] Disposition/TOC

## 2021-02-23 NOTE — ED Notes (Signed)
IVC paperwork has been corrected and given to the nurse. [place] sherriff transport called and left a VM to transport patient to [facility]

## 2021-02-23 NOTE — Consult Note (Signed)
Telepsych Consultation   Reason for Consult:  Psychiatric Reassessment Referring Physician:  Dr. Madalyn Rob Location of Patient:    [name] Location of Provider: Other: virtual home office  Patient Identification: [name] MRN:  [id number] Principal Diagnosis: Schizoaffective disorder, bipolar type ([place]) Diagnosis:  Principal Problem:   Schizoaffective disorder, bipolar type ([place]) Active Problems:   MDD (major depressive disorder), recurrent, severe, with psychosis ([place])   Total Time spent with patient: 30 minutes  Subjective:   [name] is a 30 y.o. female patient admitted with schizoaffective disorder, suicidal ideation and recommended for AM psychiatry reassessment.  Patient states, "It's horrible to live like this, why won't someone help me."  Patient seen via telepsych by this provider; chart reviewed and consulted with Dr. Dwyane Dee on 02/23/21.  On evaluation [name] reports command hallucinations, visual hallucinations, "seeing my enemies on the street and arguing with them.  She is managed by [facility] for medication mgmt but does not believe her medications are at a therapeutic level.  States she's been unable to reach her med provider in the past several days for adjustments.  On assessment today, she is sad, irritable and labile,  and unable to contract for safety.  She is asking for inpatient help to get her medications optimized.      HPI:  Per EDP Admission Assessment: 02/21/2021 Chief Complaint  Patient presents with   Hallucinations      [name] is a 30 y.o. female.  with past medical history of bipolar 1 disorder, schizophrenia, MDD, hx of intentional drug overdose who presents to the emergency department with suicidal ideations.    Patient states that she is "tired of these people following me around." She describes seeing her ex-boyfriend and another woman "everywhere" she goes. She states someone at work  told her "those people may not be there," but she does not believe that. She also states her "psych meds aren't working" and she is going to "take all of her medications," with the intent of killing herself. She denies auditory hallucinations. When asked about homicidal ideations she states, "I don't know."    Right after interview, patient becomes upset, crying and screaming that she needs help and for someone to fix her medicines.   Past Psychiatric History: schizoaffective disorder, bipolar type; MDD  Risk to Self:  yes Risk to Others:  yes Prior Inpatient Therapy: yes  Prior Outpatient Therapy:  yes  Past Medical History:  Past Medical History:  Diagnosis Date   Anxiety    Bipolar 1 disorder ([place])    Cognitive deficits    Depression    Diabetes mellitus without complication ([place])    Hypertension    Mental disorder    Mental health disorder    Obesity     Past Surgical History:  Procedure Laterality Date   CESAREAN SECTION     CESAREAN SECTION N/A 04/25/2013   Procedure: REPEAT CESAREAN SECTION;  Surgeon: [name], MD;  Location: [facility];  Service: Obstetrics;  Laterality: N/A;   MASS EXCISION N/A 06/03/2012   Procedure: EXCISION MASS;  Surgeon: [name], MD;  Location: [facility];  Service: ENT;  Laterality: N/A;  Excision uvula mass   TONSILLECTOMY N/A 06/03/2012   Procedure: TONSILLECTOMY;  Surgeon: [name], MD;  Location: [facility];  Service: ENT;  Laterality: N/A;   TONSILLECTOMY     Family History:  Family History  Problem Relation Age of Onset  Hypertension Mother    Diabetes Father    Family Psychiatric  History: unknown Social History:  Social History   Substance and Sexual Activity  Alcohol Use Not Currently     Social History   Substance and Sexual Activity  Drug Use Not Currently   Types: "Crack" cocaine, Other-see comments   Comment: Patient reports hx of smoking Crack    Social History    Socioeconomic History   Marital status: Single    Spouse name: Not on file   Number of children: Not on file   Years of education: Not on file   Highest education level: Not on file  Occupational History   Not on file  Tobacco Use   Smoking status: Every Day    Types: Cigars   Smokeless tobacco: Never   Tobacco comments:    Pt declined  Vaping Use   Vaping Use: Never used  Substance and Sexual Activity   Alcohol use: Not Currently   Drug use: Not Currently    Types: "Crack" cocaine, Other-see comments    Comment: Patient reports hx of smoking Crack   Sexual activity: Not Currently    Birth control/protection: None    Comment: occasionally  Other Topics Concern   Not on file  Social History Narrative   ** Merged History Encounter **       Social Determinants of Health   Financial Resource Strain: Not on file  Food Insecurity: Not on file  Transportation Needs: Not on file  Physical Activity: Not on file  Stress: Not on file  Social Connections: Not on file   Additional Social History:    Allergies:   Allergies  Allergen Reactions   Wellbutrin [Bupropion] Shortness Of Breath   Omnipaque [Iohexol] Swelling and Other (See Comments)    Eye swelling   Contrast Media [Iodinated Diagnostic Agents]     Eyes swell up   Depakote [Divalproex Sodium]     Mouth sores. dizziness   Hydroxyzine     Causes hyperactivity, makes pt want to "fight"   Penicillin G Hives    Labs:  Results for orders placed or performed during the hospital encounter of 02/21/21 (from the past 48 hour(s))  Rapid urine drug screen (hospital performed)     Status: Abnormal   Collection Time: 02/21/21  4:38 PM  Result Value Ref Range   Opiates NONE DETECTED NONE DETECTED   Cocaine NONE DETECTED NONE DETECTED   Benzodiazepines NONE DETECTED NONE DETECTED   Amphetamines NONE DETECTED NONE DETECTED   Tetrahydrocannabinol POSITIVE (A) NONE DETECTED   Barbiturates POSITIVE (A) NONE DETECTED     Comment: (NOTE) DRUG SCREEN FOR MEDICAL PURPOSES ONLY.  IF CONFIRMATION IS NEEDED FOR ANY PURPOSE, NOTIFY LAB WITHIN 5 DAYS.  LOWEST DETECTABLE LIMITS FOR URINE DRUG SCREEN Drug Class                     Cutoff (ng/mL) Amphetamine and metabolites    1000 Barbiturate and metabolites    200 Benzodiazepine                 244 Tricyclics and metabolites     300 Opiates and metabolites        300 Cocaine and metabolites        300 THC                            50 Performed at [facility],  [address], [place], [place] [address]   Resp Panel by RT-PCR (Flu A&B, Covid) Nasopharyngeal Swab     Status: None   Collection Time: 02/21/21  4:44 PM   Specimen: Nasopharyngeal Swab; Nasopharyngeal(NP) swabs in vial transport medium  Result Value Ref Range   SARS Coronavirus 2 by RT PCR NEGATIVE NEGATIVE    Comment: (NOTE) SARS-CoV-2 target nucleic acids are NOT DETECTED.  The SARS-CoV-2 RNA is generally detectable in upper respiratory specimens during the acute phase of infection. The lowest concentration of SARS-CoV-2 viral copies this assay can detect is 138 copies/mL. A negative result does not preclude SARS-Cov-2 infection and should not be used as the sole basis for treatment or other patient management decisions. A negative result may occur with  improper specimen collection/handling, submission of specimen other than nasopharyngeal swab, presence of viral mutation(s) within the areas targeted by this assay, and inadequate number of viral copies(<138 copies/mL). A negative result must be combined with clinical observations, patient history, and epidemiological information. The expected result is Negative.  Fact Sheet for Patients:  EntrepreneurPulse.com.au  Fact Sheet for Healthcare Providers:  IncredibleEmployment.be  This test is no t yet approved or cleared by the [place] FDA and  has been authorized for detection and/or  diagnosis of SARS-CoV-2 by FDA under an Emergency Use Authorization (EUA). This EUA will remain  in effect (meaning this test can be used) for the duration of the COVID-19 declaration under Section 564(b)(1) of the Act, 21 U.S.C.section 360bbb-3(b)(1), unless the authorization is terminated  or revoked sooner.       Influenza A by PCR NEGATIVE NEGATIVE   Influenza B by PCR NEGATIVE NEGATIVE    Comment: (NOTE) The Xpert Xpress SARS-CoV-2/FLU/RSV plus assay is intended as an aid in the diagnosis of influenza from Nasopharyngeal swab specimens and should not be used as a sole basis for treatment. Nasal washings and aspirates are unacceptable for Xpert Xpress SARS-CoV-2/FLU/RSV testing.  Fact Sheet for Patients: EntrepreneurPulse.com.au  Fact Sheet for Healthcare Providers: IncredibleEmployment.be  This test is not yet approved or cleared by the [place] FDA and has been authorized for detection and/or diagnosis of SARS-CoV-2 by FDA under an Emergency Use Authorization (EUA). This EUA will remain in effect (meaning this test can be used) for the duration of the COVID-19 declaration under Section 564(b)(1) of the Act, 21 U.S.C. section 360bbb-3(b)(1), unless the authorization is terminated or revoked.  Performed at [facility], [place] [address], [place], [place] [address]   Comprehensive metabolic panel     Status: Abnormal   Collection Time: 02/21/21  8:53 PM  Result Value Ref Range   Sodium 137 135 - 145 mmol/L   Potassium 3.9 3.5 - 5.1 mmol/L   Chloride 107 98 - 111 mmol/L   CO2 22 22 - 32 mmol/L   Glucose, Bld 119 (H) 70 - 99 mg/dL    Comment: Glucose reference range applies only to samples taken after fasting for at least 8 hours.   BUN 9 6 - 20 mg/dL   Creatinine, Ser 0.85 0.44 - 1.00 mg/dL   Calcium 8.8 (L) 8.9 - 10.3 mg/dL   Total Protein 7.1 6.5 - 8.1 g/dL   Albumin 3.9 3.5 - 5.0 g/dL   AST 21 15 - 41 U/L   ALT 17 0 -  44 U/L   Alkaline Phosphatase 59 38 - 126 U/L   Total Bilirubin 0.6 0.3 - 1.2 mg/dL   GFR, Estimated >60 >60 mL/min  Comment: (NOTE) Calculated using the CKD-EPI Creatinine Equation (2021)    Anion gap 8 5 - 15    Comment: Performed at [facility], [place] [address], [place], [place] [address]  Ethanol     Status: None   Collection Time: 02/21/21  8:53 PM  Result Value Ref Range   Alcohol, Ethyl (B) <10 <10 mg/dL    Comment: (NOTE) Lowest detectable limit for serum alcohol is 10 mg/dL.  For medical purposes only. Performed at [facility], [place] [address] [place], [place] [address]   Salicylate level     Status: Abnormal   Collection Time: 02/21/21  8:53 PM  Result Value Ref Range   Salicylate Lvl <1.8 (L) 7.0 - 30.0 mg/dL    Comment: Performed at [facility] [address] [place], [place] [address]  Acetaminophen level     Status: Abnormal   Collection Time: 02/21/21  8:53 PM  Result Value Ref Range   Acetaminophen (Tylenol), Serum <10 (L) 10 - 30 ug/mL    Comment: (NOTE) Therapeutic concentrations vary significantly. A range of 10-30 ug/mL  may be an effective concentration for many patients. However, some  are best treated at concentrations outside of this range. Acetaminophen concentrations >150 ug/mL at 4 hours after ingestion  and >50 ug/mL at 12 hours after ingestion are often associated with  toxic reactions.  Performed at [facility], [place] [address], [place], [place] [address]   cbc     Status: Abnormal   Collection Time: 02/21/21  8:53 PM  Result Value Ref Range   WBC 11.4 (H) 4.0 - 10.5 K/uL   RBC 4.77 3.87 - 5.11 MIL/uL   Hemoglobin 12.0 12.0 - 15.0 g/dL   HCT 38.9 36.0 - 46.0 %   MCV 81.6 80.0 - 100.0 fL   MCH 25.2 (L) 26.0 - 34.0 pg   MCHC 30.8 30.0 - 36.0 g/dL   RDW 16.1 (H) 11.5 - 15.5 %   Platelets 237 150 - 400 K/uL    Comment: REPEATED TO VERIFY   nRBC 0.0 0.0 - 0.2 %    Comment: Performed at [facility], [place] [address] [place], [place] [address]  I-Stat beta hCG blood, ED     Status: None   Collection Time: 02/21/21  9:06 PM  Result Value Ref Range   I-stat hCG, quantitative <5.0 <5 mIU/mL   Comment 3            Comment:   GEST. AGE      CONC.  (mIU/mL)   <=1 WEEK        5 - 50     2 WEEKS       50 - 500     3 WEEKS       100 - 10,000     4 WEEKS     1,000 - 30,000        FEMALE AND NON-PREGNANT FEMALE:     LESS THAN 5 mIU/mL     Medications:  Current Facility-Administered Medications  Medication Dose Route Frequency Provider Last Rate Last Admin   acetaminophen (TYLENOL) tablet 650 mg  650 mg Oral Q4H PRN Lucrezia Starch, MD       OLANZapine zydis (ZYPREXA) disintegrating tablet 10 mg  10 mg Oral Q8H PRN Lucrezia Starch, MD   10 mg at 02/23/21 1201   And   ziprasidone (GEODON) injection 20 mg  20 mg Intramuscular  PRN Lucrezia Starch, MD       ondansetron [facility]) tablet 4 mg  4 mg Oral Q8H PRN Lucrezia Starch, MD       zolpidem (AMBIEN) tablet 5 mg  5 mg Oral QHS PRN Lucrezia Starch, MD       Current Outpatient Medications  Medication Sig Dispense Refill   acetaminophen (TYLENOL) 500 MG tablet Take 1,000 mg by mouth every 6 (six) hours as needed for mild pain or headache.     albuterol (VENTOLIN HFA) 108 (90 Base) MCG/ACT inhaler Inhale 1-2 puffs into the lungs every 6 (six) hours as needed for wheezing or shortness of breath. 18 g 0   amLODipine (NORVASC) 5 MG tablet Take 5 mg by mouth daily.     cholecalciferol (VITAMIN D) 25 MCG (1000 UNIT) tablet Take 1,000 Units by mouth daily.     fluticasone (FLONASE) 50 MCG/ACT nasal spray Place 2 sprays into both nostrils daily. (Patient taking differently: Place 2 sprays into both nostrils daily as needed for allergies.) 18.2 mL 0   glimepiride (AMARYL) 4 MG tablet Take 4 mg by mouth daily.     haloperidol (HALDOL) 10 MG tablet Take 10-20 mg by mouth See admin instructions. 10 mg in the morning  20 mg at bedtime      haloperidol decanoate (HALDOL DECANOATE) 100 MG/ML injection Inject 150 mg into the muscle every 28 (twenty-eight) days. Inject 150mg  (1.12ml) every 28 days.     insulin detemir (LEVEMIR) 100 UNIT/ML injection Inject 0.4 mLs (40 Units total) into the skin 2 (two) times daily. 10 mL 11   lithium carbonate (ESKALITH) 450 MG CR tablet Take 450 mg by mouth daily.     omeprazole (PRILOSEC) 20 MG capsule Take 20 mg by mouth daily.     ondansetron (ZOFRAN ODT) 4 MG disintegrating tablet Take 1 tablet (4 mg total) by mouth every 8 (eight) hours as needed for nausea or vomiting. 20 tablet 0   simvastatin (ZOCOR) 20 MG tablet Take 20 mg by mouth daily.     vitamin B-12 (CYANOCOBALAMIN) 1000 MCG tablet Take 1,000 mcg by mouth daily.      Musculoskeletal: Strength & Muscle Tone: within normal limits Gait & Station: normal Patient leans: N/A   Psychiatric Specialty Exam:  Presentation  General Appearance: Bizarre  Eye Contact:Good  Speech:Clear and Coherent  Speech Volume:Increased  Handedness:Right   Mood and Affect  Mood:Dysphoric; Irritable; Anxious; Depressed  Affect:Congruent; Flat; Depressed   Thought Process  Thought Processes:Disorganized  Descriptions of Associations:Circumstantial  Orientation:Full (Time, Place and Person)  Thought Content:Illogical (suicidal ideations)  History of Schizophrenia/Schizoaffective disorder:No  Duration of Psychotic Symptoms:N/A  Hallucinations:Hallucinations: Auditory; Visual Description of Auditory Hallucinations: command hallucinations Description of Visual Hallucinations: seing people "my enemies"  Ideas of Reference:None  Suicidal Thoughts:Suicidal Thoughts: Yes, Active SI Active Intent and/or Plan: With Plan  Homicidal Thoughts:Homicidal Thoughts: No   Sensorium  Memory:Immediate Good; Recent Fair; Remote [place]   Executive Functions  Concentration:Fair  Attention  Span:Fair  [place] of Knowledge:Good  Language:Good   Psychomotor Activity  Psychomotor Activity:No data recorded  Assets  Assets:Communication Skills; Housing   Sleep  Sleep:Sleep: Fair Number of Hours of Sleep: 6    Physical Exam: Physical Exam Constitutional:      Appearance: She is obese.  Cardiovascular:     Rate and Rhythm: Normal rate.     Pulses: Normal pulses.  Pulmonary:     Effort: Pulmonary effort is  normal.  Musculoskeletal:        General: Normal range of motion.     Cervical back: Normal range of motion.  Neurological:     General: No focal deficit present.     Mental Status: She is alert and oriented to person, place, and time.   Review of Systems  Constitutional: Negative.   HENT: Negative.    Eyes: Negative.   Respiratory: Negative.    Cardiovascular: Negative.   Gastrointestinal: Negative.   Genitourinary: Negative.   Musculoskeletal: Negative.   Skin: Negative.   Neurological: Negative.   Endo/Heme/Allergies: Negative.   Psychiatric/Behavioral:  Positive for depression, hallucinations and suicidal ideas. The patient is nervous/anxious.   Blood pressure 109/75, pulse 85, temperature 97.7 F (36.5 C), temperature source Oral, resp. rate 20, last menstrual period 01/31/2021, SpO2 93 %. There is no height or weight on file to calculate BMI.  Treatment Plan Summary:  Patient with schizoaffective disorder presents with command hallucinations and visual hallucinations; visual hallucinations -self tormenting and contributes to agitation and dysphoric mood.  She has impaired judgement and lacks insights.  Has [facility] ACT team but she does not feel supported by them.  No family or friends support.  Recommend inpatient psych admission where she can receive medication adjustments and monitoring for safety.  Baseline labs completed, WNL to continue psychotropic medications.    Daily contact with patient to assess and evaluate symptoms and  progress in treatment and Medication management  Restart home medications  Disposition: Recommend psychiatric Inpatient admission when medically cleared.  This service was provided via telemedicine using a 2-way, interactive audio and video technology.  Names of all persons participating in this telemedicine service and their role in this encounter. Name: [name] Role: Patient  Name: Merlyn Lot Role: [place], NP 02/23/2021 12:25 PM

## 2021-02-23 NOTE — ED Notes (Signed)
Called [place] sheriff's department and left message to arrange transport for pt to [facility]

## 2021-02-23 NOTE — ED Notes (Signed)
Meal given to pt.

## 2021-02-23 NOTE — ED Notes (Signed)
[place] [employer] officer here to get pt but not able to transport her at this time due to [place] in IVC paperwork. Pt updated and voiced understanding.

## 2021-02-23 NOTE — ED Notes (Signed)
Pt's belongings collected from [facility] [place]. Belongings that are with security remain with security at this time.

## 2021-02-23 NOTE — ED Notes (Signed)
Pt resting calmly and quietly in bed at this time, no sitter at bedside

## 2021-02-23 NOTE — ED Notes (Signed)
Attempted multiple times to call report to [employer]. Unable to connect to facility

## 2021-02-23 NOTE — ED Notes (Signed)
Sitter leaving, no sitter available for pt

## 2021-02-23 NOTE — ED Notes (Signed)
EDP notified for order for patient's home insulin; Pt requesting states has not been given in a couple of days; BS take for precaution due to patient stating she starting for feel sick from not getting home insulin-Monique,RN

## 2021-02-23 NOTE — ED Notes (Signed)
Pt's belongings returned to locker #8. Belongings that were with security returned unopened to security.

## 2021-02-23 NOTE — Progress Notes (Signed)
CSW spoke with Raven with [facility] who advised that this patient is under review at there facility. CSW provided the patient's nurse number for further review for possible placement.   Glennie Isle, MSW, [place], LCAS-A Phone: [phone] Disposition/TOC

## 2021-02-23 NOTE — ED Notes (Signed)
Attempted to call report again at [facility] Was transferred but no one ever picked up the phone.

## 2021-02-23 NOTE — Progress Notes (Signed)
Per Teena Irani, patient meets criteria for inpatient treatment. There are no available or appropriate beds at [facility] today. CSW faxed referrals to the following facilities for review:  [facility]  Pending - No Request Sent N/A [address] [place] [address] [phone] --  [facility]  Pending - No Request Sent N/A [address] [place] [address] [phone] --  [facility]  Pending - No Request [address] [place] [address] [phone] --  [address] [place] [address] [phone] --  [facility]  Pending - No Request Sent N/A [address], [place] [address] [phone] --  [facility]  Pending - No Request Sent N/A [address] [place] [address] [place] --  [facility]  Pending - No Request Sent N/A [address] [place] [address] [facility]  Pending - No Request Sent N/A [address] [place] [address] [phone] --  [facility]  Pending - No Request Sent N/A [address] [place] [address] [phone] --  [facility]  Pending - No Request Sent N/A [address] [place] [address] [phone] --  [facility]  Pending - No Request Sent N/A [address], [place] [address] [phone] --  [facility]  Pending - No Request Sent N/A [place], [place] [address] [facility]  Pending - No Request Sent N/A [address] [place] [address] [phone] --  [facility]  Pending - No Request Sent N/A [address] [place] [address] [phone] --  [facility]  Pending - No Request Sent N/A [place], [place] [address] [phone] --  [facility]  Pending - No Request Sent N/A [address] [place] [address] [phone] --    TT[facility] will continue to seek bed placement.  Glennie Isle, MSW, [place], LCAS-A Phone: [phone] Disposition/TOC

## 2021-02-23 NOTE — ED Notes (Signed)
Assisted pt with phone call

## 2021-02-24 NOTE — ED Notes (Signed)
Sheriff at bedside for transport.

## 2021-02-24 NOTE — ED Notes (Signed)
Spoke with [employer] office, stated they will be in route to pick up patient form [name] and transport to [facility] Dr Dina [place] notified for [place].

## 2021-02-24 NOTE — ED Provider Notes (Signed)
Emergency Medicine Observation Re-evaluation Note  [name] is a 30 y.o. female, seen on rounds today.  Pt initially presented to the ED for complaints of Hallucinations Currently, the patient is resting.  Physical Exam  BP (!) 140/98 (BP Location: Right Arm)   Pulse 78   Temp 98.5 F (36.9 C) (Oral)   Resp 18   LMP 01/31/2021   SpO2 100%  Physical Exam General: resting comfortably, NAD Lungs: normal WOB Psych: currently calm and resting  ED Course / MDM  EKG:   I have reviewed the labs performed to date as well as medications administered while in observation.  Recent changes in the last 24 hours include patient has been accepted to facility.  Plan  Current plan is for placement at [facility], we are pending nurse to nurse report and chair for transport.  [name] is not under involuntary commitment.     Lorelle Gibbs, [place] 02/24/21 3361

## 2021-03-15 ENCOUNTER — Emergency Department (HOSPITAL_COMMUNITY)
Admission: EM | Admit: 2021-03-15 | Discharge: 2021-03-16 | Disposition: A | Discharge status: Home / Self Care | Payer: Medicaid Other | Attending: Emergency Medicine | Admitting: Emergency Medicine

## 2021-03-15 DIAGNOSIS — E1142 Type 2 diabetes mellitus with diabetic polyneuropathy: Secondary | ICD-10-CM | POA: Insufficient documentation

## 2021-03-15 DIAGNOSIS — I10 Essential (primary) hypertension: Secondary | ICD-10-CM | POA: Diagnosis not present

## 2021-03-15 DIAGNOSIS — F17290 Nicotine dependence, other tobacco product, uncomplicated: Secondary | ICD-10-CM | POA: Insufficient documentation

## 2021-03-15 DIAGNOSIS — Z794 Long term (current) use of insulin: Secondary | ICD-10-CM | POA: Insufficient documentation

## 2021-03-15 DIAGNOSIS — L292 Pruritus vulvae: Secondary | ICD-10-CM | POA: Insufficient documentation

## 2021-03-15 DIAGNOSIS — Z5321 Procedure and treatment not carried out due to patient leaving prior to being seen by health care provider: Secondary | ICD-10-CM | POA: Insufficient documentation

## 2021-03-15 DIAGNOSIS — B3731 Acute candidiasis of vulva and vagina: Secondary | ICD-10-CM | POA: Diagnosis not present

## 2021-03-15 DIAGNOSIS — Z79899 Other long term (current) drug therapy: Secondary | ICD-10-CM | POA: Diagnosis not present

## 2021-03-15 DIAGNOSIS — N898 Other specified noninflammatory disorders of vagina: Secondary | ICD-10-CM | POA: Insufficient documentation

## 2021-03-15 DIAGNOSIS — Z7984 Long term (current) use of oral hypoglycemic drugs: Secondary | ICD-10-CM | POA: Insufficient documentation

## 2021-03-15 DIAGNOSIS — R112 Nausea with vomiting, unspecified: Secondary | ICD-10-CM | POA: Insufficient documentation

## 2021-03-15 NOTE — ED Notes (Signed)
Pt called for in ED lobby for MSE- no answer x2. [employer]

## 2021-03-15 NOTE — ED Notes (Signed)
Pt called for in ED lobby for MSE- no answer x1. [employer]

## 2021-03-15 NOTE — ED Triage Notes (Signed)
Patient reports vaginal itching/burning/white discharge x 2 days. Nausea/vomiting x 3 weeks. Denies pain in triage

## 2021-03-16 ENCOUNTER — Emergency Department (HOSPITAL_COMMUNITY)
Admission: EM | Admit: 2021-03-16 | Discharge: 2021-03-16 | Disposition: A | Discharge status: Home / Self Care | Payer: Medicaid Other | Source: Home / Self Care | Attending: Emergency Medicine | Admitting: Emergency Medicine

## 2021-03-16 DIAGNOSIS — Z794 Long term (current) use of insulin: Secondary | ICD-10-CM | POA: Insufficient documentation

## 2021-03-16 DIAGNOSIS — N898 Other specified noninflammatory disorders of vagina: Secondary | ICD-10-CM | POA: Insufficient documentation

## 2021-03-16 DIAGNOSIS — E1142 Type 2 diabetes mellitus with diabetic polyneuropathy: Secondary | ICD-10-CM | POA: Insufficient documentation

## 2021-03-16 DIAGNOSIS — Z79899 Other long term (current) drug therapy: Secondary | ICD-10-CM | POA: Insufficient documentation

## 2021-03-16 DIAGNOSIS — N760 Acute vaginitis: Secondary | ICD-10-CM

## 2021-03-16 DIAGNOSIS — I10 Essential (primary) hypertension: Secondary | ICD-10-CM | POA: Insufficient documentation

## 2021-03-16 DIAGNOSIS — B3731 Acute candidiasis of vulva and vagina: Secondary | ICD-10-CM | POA: Insufficient documentation

## 2021-03-16 DIAGNOSIS — Z7984 Long term (current) use of oral hypoglycemic drugs: Secondary | ICD-10-CM | POA: Insufficient documentation

## 2021-03-16 DIAGNOSIS — B9689 Other specified bacterial agents as the cause of diseases classified elsewhere: Secondary | ICD-10-CM

## 2021-03-16 DIAGNOSIS — F17290 Nicotine dependence, other tobacco product, uncomplicated: Secondary | ICD-10-CM | POA: Insufficient documentation

## 2021-03-16 LAB — URINALYSIS, ROUTINE W REFLEX MICROSCOPIC
Bilirubin Urine: NEGATIVE
Glucose, UA: >=500 mg/dL — AB
Hgb urine dipstick: NEGATIVE
Ketones, ur: NEGATIVE mg/dL
Nitrite: NEGATIVE
Protein, ur: NEGATIVE mg/dL
Specific Gravity, Urine: 1.026 (ref 1.005–1.030)
pH: 6 (ref 5.0–8.0)

## 2021-03-16 LAB — RAPID URINE DRUG SCREEN, HOSP PERFORMED
Amphetamines: NOT DETECTED
Barbiturates: POSITIVE — AB
Benzodiazepines: NOT DETECTED
Cocaine: NOT DETECTED
Opiates: NOT DETECTED
Tetrahydrocannabinol: NOT DETECTED

## 2021-03-16 LAB — PREGNANCY, URINE: Preg Test, Ur: NEGATIVE

## 2021-03-16 LAB — WET PREP, GENITAL
Sperm: NONE SEEN
Trich, Wet Prep: NONE SEEN
WBC, Wet Prep HPF POC: >=10 — AB (ref <10)

## 2021-03-16 MED ORDER — METRONIDAZOLE 500 MG PO TABS: 500.0000 mg | ORAL_TABLET | Freq: Two times a day (BID) | ORAL | 0 refills | Status: DC

## 2021-03-16 MED ORDER — FLUCONAZOLE 150 MG PO TABS
150.0000 mg | ORAL_TABLET | Freq: Once | ORAL | Status: AC
  Administered 2021-03-16: 150 mg via ORAL
  Filled 2021-03-16: qty 1

## 2021-03-16 MED ORDER — METRONIDAZOLE 500 MG PO TABS
500.0000 mg | ORAL_TABLET | Freq: Once | ORAL | Status: AC
  Administered 2021-03-16: 500 mg via ORAL
  Filled 2021-03-16 (×2): qty 1

## 2021-03-16 MED ORDER — FLUCONAZOLE 150 MG PO TABS: 150.0000 mg | ORAL_TABLET | Freq: Once | ORAL | 0 refills | Status: AC

## 2021-03-16 NOTE — ED Provider Notes (Signed)
[facility] DEPT Provider Note   CSN: [id number] Arrival date & time: 03/16/21  1136     History Chief Complaint  Patient presents with   Vaginal Itching    [name] is a 30 y.o. female history of bipolar, diabetes, hypertension here presenting with pelvic discharge.  Patient states that for the last 4 to 5 days she has been having whitish discharge from her vagina.  She is concerned that she may have a yeast infection.  She is sexually active with 1 female partner.  She states that she is not concerned for STDs.  Patient has previous visits for pelvic pain and pelvic discharge  The history is provided by the patient.      Past Medical History:  Diagnosis Date   Anxiety    Bipolar 1 disorder ([place])    Cognitive deficits    Depression    Diabetes mellitus without complication ([place])    Hypertension    Mental disorder    Mental health disorder    Obesity     Patient Active Problem List   Diagnosis Date Noted   Syphilis 07/15/2020   Malingering 06/05/2020   Gastroesophageal reflux disease 05/04/2020   Hyperglycemia due to type 2 diabetes mellitus ([place]) 05/04/2020   Long term (current) use of insulin ([place]) 05/04/2020   Migraine without aura 05/04/2020   Morbid obesity ([place]) 05/04/2020   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 2 diabetes mellitus ([place]) 05/04/2020   Prolapsed internal hemorrhoids 05/04/2020   Vitamin D deficiency 05/04/2020   Other symptoms and signs involving cognitive functions and awareness 05/04/2020   Suicide attempt ([place])    Anxiety state 03/06/2020   Schizophrenia ([place]) 09/13/2019   Bipolar I disorder, most recent episode depressed ([place]) 06/23/2019   MDD (major depressive disorder) 10/10/2018   Schizoaffective disorder, bipolar type ([place]) 09/25/2018   Affective psychosis, bipolar ([place]) 06/13/2018   HTN (hypertension) 05/03/2018   Tobacco use disorder 05/03/2018   Adjustment disorder with emotional disturbance 01/02/2018    Schizophrenia, 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   Moderate bipolar I disorder, most recent episode depressed ([place])    Psychosis ([place])    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/2017   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   OCD (obsessive compulsive disorder) 10/05/2016   Major depressive disorder, recurrent episode, mild ([place]) 05/04/2016   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   Learning disability 07/18/2015   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/2015   Diabetes mellitus ([place]) 07/18/2015   MDD (major depressive disorder), recurrent, severe, 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   Hyperlipidemia 07/18/2015   Severe episode of recurrent major depressive disorder, without psychotic features ([place])    Suicidal ideation    Drug overdose    Cognitive deficits 10/12/2012   Generalized anxiety disorder 06/28/2012    Past Surgical History:  Procedure Laterality Date   CESAREAN SECTION     CESAREAN SECTION N/A 04/25/2013   Procedure: REPEAT CESAREAN SECTION;  Surgeon: [name], MD;  Location: [facility];  Service: Obstetrics;  Laterality: N/A;   MASS EXCISION N/A 06/03/2012   Procedure: EXCISION MASS;  Surgeon: [name], MD;  Location: [facility];  Service: ENT;  Laterality: N/A;  Excision uvula mass   TONSILLECTOMY N/A 06/03/2012   Procedure: TONSILLECTOMY;  Surgeon: [name], MD;  Location: [facility];  Service: ENT;  Laterality: N/A;   TONSILLECTOMY       OB History     Gravida  3   Para  3  Term  3   Preterm  0   AB  0   Living  3      SAB  0   IAB  0   Ectopic  0   Multiple      Live Births  3           Family History  Problem Relation Age of Onset   Hypertension Mother    Diabetes Father     Social History   Tobacco Use   Smoking status: Every Day    Types: Cigars   Smokeless tobacco: Never   Tobacco comments:    Pt declined  Vaping Use   Vaping Use: Never used  Substance Use Topics    Alcohol use: Not Currently   Drug use: Not Currently    Types: "Crack" cocaine, Other-see comments    Comment: Patient reports hx of smoking Crack    Home Medications Prior to Admission medications   Medication Sig Start Date End Date Taking? Authorizing Provider  acetaminophen (TYLENOL) 500 MG tablet Take 1,000 mg by mouth every 6 (six) hours as needed for mild pain or headache.    [provider]  albuterol (VENTOLIN HFA) 108 (90 Base) MCG/ACT inhaler Inhale 1-2 puffs into the lungs every 6 (six) hours as needed for wheezing or shortness of breath. 01/11/21   Pearson Forster, NP  amLODipine (NORVASC) 5 MG tablet Take 5 mg by mouth daily.    [provider]  cholecalciferol (VITAMIN D) 25 MCG (1000 UNIT) tablet Take 1,000 Units by mouth daily. 04/30/20   [provider]  fluticasone (FLONASE) 50 MCG/ACT nasal spray Place 2 sprays into both nostrils daily. Patient taking differently: Place 2 sprays into both nostrils daily as needed for allergies. 01/11/21   Pearson Forster, NP  glimepiride (AMARYL) 4 MG tablet Take 4 mg by mouth daily.    [provider]  haloperidol (HALDOL) 10 MG tablet Take 10-20 mg by mouth See admin instructions. 10 mg in the morning  20 mg at bedtime 05/09/20   [provider]  haloperidol decanoate (HALDOL DECANOATE) 100 MG/ML injection Inject 150 mg into the muscle every 28 (twenty-eight) days. Inject 150mg  (1.28ml) every 28 days.    [provider]  insulin detemir (LEVEMIR) 100 UNIT/ML injection Inject 0.4 mLs (40 Units total) into the skin 2 (two) times daily. 05/05/20   Montine Circle, PA-C  lithium carbonate (ESKALITH) 450 MG CR tablet Take 450 mg by mouth daily.    [provider]  omeprazole (PRILOSEC) 20 MG capsule Take 20 mg by mouth daily.    [provider]  ondansetron (ZOFRAN ODT) 4 MG disintegrating tablet Take 1 tablet (4 mg total) by mouth every 8 (eight) hours as needed for nausea or  vomiting. 01/28/21   Volney American, PA-C  simvastatin (ZOCOR) 20 MG tablet Take 20 mg by mouth daily.    [provider]  vitamin B-12 (CYANOCOBALAMIN) 1000 MCG tablet Take 1,000 mcg by mouth daily. 11/13/19   [provider]    Allergies    Wellbutrin [bupropion], Omnipaque [iohexol], Contrast media [iodinated diagnostic agents], Depakote [divalproex sodium], Hydroxyzine, and Penicillin g  Review of Systems   Review of Systems  Genitourinary:  Positive for vaginal discharge.  All other systems reviewed and are negative.  Physical Exam Updated Vital Signs BP (!) 181/115 (BP Location: Right Arm)   Pulse (!) 108   Temp 98.3 F (36.8 C) (Oral)   Resp 18  Ht 5\' 7"  (1.702 m)   Wt 117.9 kg   LMP  (LMP Unknown)   SpO2 99%   BMI 40.72 kg/m   Physical Exam Vitals and nursing note reviewed.  Constitutional:      Appearance: Normal appearance.  HENT:     Head: Normocephalic.     Nose: Nose normal.     Mouth/Throat:     Mouth: Mucous membranes are moist.  Eyes:     Extraocular Movements: Extraocular movements intact.     Pupils: Pupils are equal, round, and reactive to light.  Cardiovascular:     Rate and Rhythm: Normal rate.     Pulses: Normal pulses.     Heart sounds: Normal heart sounds.  Pulmonary:     Effort: Pulmonary effort is normal.     Breath sounds: Normal breath sounds.  Abdominal:     General: Abdomen is flat.     Palpations: Abdomen is soft.  Genitourinary:    Comments: Cottage cheese appearing whitish discharge from vagina.  No CMT or adnexal or uterine tenderness.  No obvious rash on external exam Musculoskeletal:        General: Normal range of motion.     Cervical back: Normal range of motion.  Skin:    General: Skin is warm.     Capillary Refill: Capillary refill takes less than 2 seconds.  Neurological:     General: No focal deficit present.     Mental Status: She is alert and oriented to person, place, and time.   Psychiatric:        Mood and Affect: Mood normal.        Behavior: Behavior normal.    ED Results / Procedures / Treatments   Labs (all labs ordered are listed, but only abnormal results are displayed) Labs Reviewed  WET PREP, GENITAL - Abnormal; Notable for the following components:      Result Value   Yeast Wet Prep HPF POC PRESENT (*)    Clue Cells Wet Prep HPF POC PRESENT (*)    WBC, Wet Prep HPF POC >=10 (*)    All other components within normal limits  URINALYSIS, ROUTINE W REFLEX MICROSCOPIC - Abnormal; Notable for the following components:   Color, Urine STRAW (*)    APPearance HAZY (*)    Glucose, UA >=500 (*)    Leukocytes,Ua MODERATE (*)    Bacteria, UA RARE (*)    All other components within normal limits  PREGNANCY, URINE  RAPID URINE DRUG SCREEN, HOSP PERFORMED  GC/CHLAMYDIA PROBE AMP ([place]) NOT AT [facility]    EKG None  Radiology No results found.  Procedures Procedures   Medications Ordered in ED Medications  fluconazole (DIFLUCAN) tablet 150 mg (has no administration in time range)  metroNIDAZOLE (FLAGYL) tablet 500 mg (has no administration in time range)    ED Course  I have reviewed the triage vital signs and the nursing notes.  Pertinent labs & imaging results that were available during my care of the patient were reviewed by me and considered in my medical decision making (see chart for details).    MDM Rules/Calculators/A&P                           Kaytlynne Chelcie Estorga is a 30 y.o. female here presenting with pelvic discharge.  Consider BV versus yeast.  Patient does not want empiric treatment for STDs.  Patient is afebrile and well-appearing.  No signs  of PID currently  4:40 PM Patient's wet prep is positive for yeast and clue cells.  Patient was given a dose of Diflucan and Flagyl here. Will give a course of Flagyl and given extra dose of Diflucan  Final Clinical Impression(s) / ED Diagnoses Final diagnoses:  None    Rx / DC  Orders ED Discharge Orders     None        Drenda Freeze, MD 03/16/21 1640

## 2021-03-16 NOTE — Discharge Instructions (Signed)
You have bacterial vaginosis.  Take Flagyl twice daily for a week  You also have yeast infection.  You were given a dose of Diflucan and I also prescribed another dose  Follow-up with your GYN doctor for  Gonorrhea and Chlamydia tests were sent and you will be called for positive results  Return to ER if you have worse pelvic discharge, severe pelvic pain, abdominal pain and vomiting and fever

## 2021-03-16 NOTE — ED Triage Notes (Signed)
Patient reports vaginal itching/burning. Requests pregnancy test. Reports abd pain and weakness. Patient in ED yesterday, same concern. LWBS.

## 2021-03-17 LAB — GC/CHLAMYDIA PROBE AMP (~~LOC~~) NOT AT ARMC
Chlamydia: NEGATIVE
Comment: NEGATIVE
Comment: NORMAL
Neisseria Gonorrhea: NEGATIVE

## 2021-03-22 ENCOUNTER — Emergency Department (HOSPITAL_COMMUNITY)
Admission: EM | Admit: 2021-03-22 | Discharge: 2021-03-22 | Disposition: A | Discharge status: Home / Self Care | Payer: Medicaid Other | Source: Home / Self Care | Attending: Emergency Medicine | Admitting: Emergency Medicine

## 2021-03-22 ENCOUNTER — Ambulatory Visit (HOSPITAL_COMMUNITY)
Admission: EM | Admit: 2021-03-22 | Discharge: 2021-03-23 | Disposition: A | Discharge status: Home / Self Care | Payer: Medicaid Other | Source: Home / Self Care

## 2021-03-22 ENCOUNTER — Other Ambulatory Visit: Payer: Self-pay

## 2021-03-22 ENCOUNTER — Encounter (HOSPITAL_COMMUNITY): Payer: Self-pay | Admitting: Emergency Medicine

## 2021-03-22 DIAGNOSIS — Z560 Unemployment, unspecified: Secondary | ICD-10-CM | POA: Insufficient documentation

## 2021-03-22 DIAGNOSIS — F332 Major depressive disorder, recurrent severe without psychotic features: Secondary | ICD-10-CM | POA: Insufficient documentation

## 2021-03-22 DIAGNOSIS — I10 Essential (primary) hypertension: Secondary | ICD-10-CM

## 2021-03-22 DIAGNOSIS — R45851 Suicidal ideations: Secondary | ICD-10-CM

## 2021-03-22 DIAGNOSIS — Z794 Long term (current) use of insulin: Secondary | ICD-10-CM | POA: Insufficient documentation

## 2021-03-22 DIAGNOSIS — R109 Unspecified abdominal pain: Secondary | ICD-10-CM | POA: Insufficient documentation

## 2021-03-22 DIAGNOSIS — F419 Anxiety disorder, unspecified: Secondary | ICD-10-CM | POA: Insufficient documentation

## 2021-03-22 DIAGNOSIS — Z638 Other specified problems related to primary support group: Secondary | ICD-10-CM | POA: Insufficient documentation

## 2021-03-22 DIAGNOSIS — R450 Nervousness: Secondary | ICD-10-CM | POA: Insufficient documentation

## 2021-03-22 DIAGNOSIS — Z9151 Personal history of suicidal behavior: Secondary | ICD-10-CM | POA: Insufficient documentation

## 2021-03-22 DIAGNOSIS — F250 Schizoaffective disorder, bipolar type: Secondary | ICD-10-CM | POA: Insufficient documentation

## 2021-03-22 DIAGNOSIS — R102 Pelvic and perineal pain: Secondary | ICD-10-CM

## 2021-03-22 DIAGNOSIS — R110 Nausea: Secondary | ICD-10-CM

## 2021-03-22 DIAGNOSIS — F22 Delusional disorders: Secondary | ICD-10-CM | POA: Insufficient documentation

## 2021-03-22 DIAGNOSIS — Z5321 Procedure and treatment not carried out due to patient leaving prior to being seen by health care provider: Secondary | ICD-10-CM | POA: Insufficient documentation

## 2021-03-22 DIAGNOSIS — F429 Obsessive-compulsive disorder, unspecified: Secondary | ICD-10-CM | POA: Insufficient documentation

## 2021-03-22 DIAGNOSIS — Z20822 Contact with and (suspected) exposure to covid-19: Secondary | ICD-10-CM | POA: Insufficient documentation

## 2021-03-22 DIAGNOSIS — R739 Hyperglycemia, unspecified: Secondary | ICD-10-CM

## 2021-03-22 DIAGNOSIS — Z599 Problem related to housing and economic circumstances, unspecified: Secondary | ICD-10-CM | POA: Insufficient documentation

## 2021-03-22 DIAGNOSIS — Z7984 Long term (current) use of oral hypoglycemic drugs: Secondary | ICD-10-CM | POA: Insufficient documentation

## 2021-03-22 DIAGNOSIS — E871 Hypo-osmolality and hyponatremia: Secondary | ICD-10-CM | POA: Insufficient documentation

## 2021-03-22 DIAGNOSIS — F17210 Nicotine dependence, cigarettes, uncomplicated: Secondary | ICD-10-CM | POA: Insufficient documentation

## 2021-03-22 DIAGNOSIS — R45850 Homicidal ideations: Secondary | ICD-10-CM

## 2021-03-22 DIAGNOSIS — D649 Anemia, unspecified: Secondary | ICD-10-CM

## 2021-03-22 DIAGNOSIS — R112 Nausea with vomiting, unspecified: Secondary | ICD-10-CM | POA: Insufficient documentation

## 2021-03-22 DIAGNOSIS — N9489 Other specified conditions associated with female genital organs and menstrual cycle: Secondary | ICD-10-CM | POA: Insufficient documentation

## 2021-03-22 DIAGNOSIS — D500 Iron deficiency anemia secondary to blood loss (chronic): Secondary | ICD-10-CM | POA: Insufficient documentation

## 2021-03-22 DIAGNOSIS — Z79899 Other long term (current) drug therapy: Secondary | ICD-10-CM | POA: Insufficient documentation

## 2021-03-22 DIAGNOSIS — E1165 Type 2 diabetes mellitus with hyperglycemia: Secondary | ICD-10-CM | POA: Insufficient documentation

## 2021-03-22 DIAGNOSIS — F17290 Nicotine dependence, other tobacco product, uncomplicated: Secondary | ICD-10-CM | POA: Insufficient documentation

## 2021-03-22 LAB — CBC WITH DIFFERENTIAL/PLATELET
Abs Immature Granulocytes: 0.05 10*3/uL (ref 0.00–0.07)
Abs Immature Granulocytes: 0.06 10*3/uL (ref 0.00–0.07)
Basophils Absolute: 0.1 10*3/uL (ref 0.0–0.1)
Basophils Absolute: 0.1 10*3/uL (ref 0.0–0.1)
Basophils Relative: 0 %
Basophils Relative: 1 %
Eosinophils Absolute: 0.4 10*3/uL (ref 0.0–0.5)
Eosinophils Absolute: 0.4 10*3/uL (ref 0.0–0.5)
Eosinophils Relative: 3 %
Eosinophils Relative: 3 %
HCT: 35.5 % — ABNORMAL LOW (ref 36.0–46.0)
HCT: 35.3 % — ABNORMAL LOW (ref 36.0–46.0)
Hemoglobin: 11.4 g/dL — ABNORMAL LOW (ref 12.0–15.0)
Hemoglobin: 11.3 g/dL — ABNORMAL LOW (ref 12.0–15.0)
Immature Granulocytes: 0 %
Immature Granulocytes: 1 %
Lymphocytes Relative: 30 %
Lymphocytes Relative: 31 %
Lymphs Abs: 4.2 10*3/uL — ABNORMAL HIGH (ref 0.7–4.0)
Lymphs Abs: 3.7 10*3/uL (ref 0.7–4.0)
MCH: 25.9 pg — ABNORMAL LOW (ref 26.0–34.0)
MCH: 25.9 pg — ABNORMAL LOW (ref 26.0–34.0)
MCHC: 32.1 g/dL (ref 30.0–36.0)
MCHC: 32 g/dL (ref 30.0–36.0)
MCV: 80.7 fL (ref 80.0–100.0)
MCV: 80.8 fL (ref 80.0–100.0)
Monocytes Absolute: 0.9 10*3/uL (ref 0.1–1.0)
Monocytes Absolute: 0.8 10*3/uL (ref 0.1–1.0)
Monocytes Relative: 6 %
Monocytes Relative: 7 %
Neutro Abs: 8.5 10*3/uL — ABNORMAL HIGH (ref 1.7–7.7)
Neutro Abs: 7.2 10*3/uL (ref 1.7–7.7)
Neutrophils Relative %: 61 %
Neutrophils Relative %: 57 %
Platelets: 308 10*3/uL (ref 150–400)
Platelets: 280 10*3/uL (ref 150–400)
RBC: 4.4 MIL/uL (ref 3.87–5.11)
RBC: 4.37 MIL/uL (ref 3.87–5.11)
RDW: 15.5 % (ref 11.5–15.5)
RDW: 15.3 % (ref 11.5–15.5)
WBC: 14 10*3/uL — ABNORMAL HIGH (ref 4.0–10.5)
WBC: 12.3 10*3/uL — ABNORMAL HIGH (ref 4.0–10.5)
nRBC: 0 % (ref 0.0–0.2)
nRBC: 0 % (ref 0.0–0.2)

## 2021-03-22 LAB — URINALYSIS, ROUTINE W REFLEX MICROSCOPIC
Bacteria, UA: NONE SEEN
Bilirubin Urine: NEGATIVE
Glucose, UA: >=500 mg/dL — AB
Hgb urine dipstick: NEGATIVE
Ketones, ur: NEGATIVE mg/dL
Leukocytes,Ua: NEGATIVE
Nitrite: NEGATIVE
Protein, ur: NEGATIVE mg/dL
Specific Gravity, Urine: 1.026 (ref 1.005–1.030)
pH: 6 (ref 5.0–8.0)

## 2021-03-22 LAB — POCT URINE DRUG SCREEN - MANUAL ENTRY (I-SCREEN)
POC Amphetamine UR: NOT DETECTED
POC Buprenorphine (BUP): NOT DETECTED
POC Cocaine UR: NOT DETECTED
POC Marijuana UR: POSITIVE — AB
POC Methadone UR: NOT DETECTED
POC Methamphetamine UR: NOT DETECTED
POC Morphine: NOT DETECTED
POC Oxazepam (BZO): NOT DETECTED
POC Oxycodone UR: NOT DETECTED
POC Secobarbital (BAR): NOT DETECTED

## 2021-03-22 LAB — LIPID PANEL
Cholesterol: 226 mg/dL — ABNORMAL HIGH (ref 0–200)
HDL: 48 mg/dL (ref >40)
LDL Cholesterol: 109 mg/dL — ABNORMAL HIGH (ref 0–99)
Total CHOL/HDL Ratio: 4.7 RATIO
Triglycerides: 344 mg/dL — ABNORMAL HIGH (ref <150)
VLDL: 69 mg/dL — ABNORMAL HIGH (ref 0–40)

## 2021-03-22 LAB — RESP PANEL BY RT-PCR (FLU A&B, COVID) ARPGX2
Influenza A by PCR: NEGATIVE
Influenza B by PCR: NEGATIVE
SARS Coronavirus 2 by RT PCR: NEGATIVE

## 2021-03-22 LAB — COMPREHENSIVE METABOLIC PANEL
ALT: 22 U/L (ref 0–44)
ALT: 22 U/L (ref 0–44)
AST: 23 U/L (ref 15–41)
AST: 21 U/L (ref 15–41)
Albumin: 3.9 g/dL (ref 3.5–5.0)
Albumin: 3.9 g/dL (ref 3.5–5.0)
Alkaline Phosphatase: 65 U/L (ref 38–126)
Alkaline Phosphatase: 68 U/L (ref 38–126)
Anion gap: 9 (ref 5–15)
Anion gap: 14 (ref 5–15)
BUN: 9 mg/dL (ref 6–20)
BUN: 6 mg/dL (ref 6–20)
CO2: 23 mmol/L (ref 22–32)
CO2: 23 mmol/L (ref 22–32)
Calcium: 8.9 mg/dL (ref 8.9–10.3)
Calcium: 8.9 mg/dL (ref 8.9–10.3)
Chloride: 101 mmol/L (ref 98–111)
Chloride: 98 mmol/L (ref 98–111)
Creatinine, Ser: 0.71 mg/dL (ref 0.44–1.00)
Creatinine, Ser: 0.77 mg/dL (ref 0.44–1.00)
GFR, Estimated: >60 mL/min (ref >60)
GFR, Estimated: >60 mL/min (ref >60)
Glucose, Bld: 330 mg/dL — ABNORMAL HIGH (ref 70–99)
Glucose, Bld: 280 mg/dL — ABNORMAL HIGH (ref 70–99)
Potassium: 3.5 mmol/L (ref 3.5–5.1)
Potassium: 3.4 mmol/L — ABNORMAL LOW (ref 3.5–5.1)
Sodium: 133 mmol/L — ABNORMAL LOW (ref 135–145)
Sodium: 135 mmol/L (ref 135–145)
Total Bilirubin: 0.4 mg/dL (ref 0.3–1.2)
Total Bilirubin: 0.6 mg/dL (ref 0.3–1.2)
Total Protein: 7.3 g/dL (ref 6.5–8.1)
Total Protein: 6.9 g/dL (ref 6.5–8.1)

## 2021-03-22 LAB — GLUCOSE, CAPILLARY
Glucose-Capillary: 364 mg/dL — ABNORMAL HIGH (ref 70–99)
Glucose-Capillary: 260 mg/dL — ABNORMAL HIGH (ref 70–99)
Glucose-Capillary: 263 mg/dL — ABNORMAL HIGH (ref 70–99)

## 2021-03-22 LAB — LIPASE, BLOOD: Lipase: 41 U/L (ref 11–51)

## 2021-03-22 LAB — I-STAT BETA HCG BLOOD, ED (MC, WL, AP ONLY): I-stat hCG, quantitative: <5 m[IU]/mL (ref <5)

## 2021-03-22 LAB — POC SARS CORONAVIRUS 2 AG: SARSCOV2ONAVIRUS 2 AG: NEGATIVE

## 2021-03-22 LAB — POC SARS CORONAVIRUS 2 AG -  ED: SARS Coronavirus 2 Ag: NEGATIVE

## 2021-03-22 LAB — TSH: TSH: 2.914 u[IU]/mL (ref 0.350–4.500)

## 2021-03-22 LAB — PREGNANCY, URINE: Preg Test, Ur: NEGATIVE

## 2021-03-22 LAB — LITHIUM LEVEL: Lithium Lvl: 0.42 mmol/L — ABNORMAL LOW (ref 0.60–1.20)

## 2021-03-22 LAB — CBG MONITORING, ED: Glucose-Capillary: 334 mg/dL — ABNORMAL HIGH (ref 70–99)

## 2021-03-22 LAB — ETHANOL: Alcohol, Ethyl (B): <10 mg/dL (ref <10)

## 2021-03-22 MED ORDER — DIPHENHYDRAMINE HCL 50 MG/ML IJ SOLN: 50.0000 mg | Freq: Four times a day (QID) | INTRAMUSCULAR | Status: DC | PRN

## 2021-03-22 MED ORDER — INSULIN ASPART 100 UNIT/ML IJ SOLN
0.0000 [IU] | Freq: Every day | INTRAMUSCULAR | Status: DC
Start: 2021-03-22 — End: 2021-03-23
  Administered 2021-03-22: 3 [IU] via SUBCUTANEOUS

## 2021-03-22 MED ORDER — INSULIN ASPART 100 UNIT/ML IV SOLN
10.0000 [IU] | Freq: Once | INTRAVENOUS | Status: DC
  Filled 2021-03-22: qty 0.1

## 2021-03-22 MED ORDER — DIPHENHYDRAMINE HCL 50 MG PO CAPS: 50.0000 mg | ORAL_CAPSULE | Freq: Four times a day (QID) | ORAL | Status: DC | PRN

## 2021-03-22 MED ORDER — LORAZEPAM 1 MG PO TABS
ORAL_TABLET | ORAL | Status: AC
  Filled 2021-03-22: qty 2

## 2021-03-22 MED ORDER — METRONIDAZOLE 250 MG PO TABS
500.0000 mg | ORAL_TABLET | Freq: Two times a day (BID) | ORAL | Status: DC
  Administered 2021-03-22 – 2021-03-23 (×3): 500 mg via ORAL
  Filled 2021-03-22 (×3): qty 2

## 2021-03-22 MED ORDER — HALOPERIDOL 5 MG PO TABS
5.0000 mg | ORAL_TABLET | Freq: Two times a day (BID) | ORAL | Status: DC
  Administered 2021-03-22 – 2021-03-23 (×3): 5 mg via ORAL
  Filled 2021-03-22 (×3): qty 1

## 2021-03-22 MED ORDER — LORAZEPAM 2 MG/ML IJ SOLN: 2.0000 mg | Freq: Four times a day (QID) | INTRAMUSCULAR | Status: DC | PRN

## 2021-03-22 MED ORDER — TRAZODONE HCL 50 MG PO TABS
50.0000 mg | ORAL_TABLET | Freq: Every evening | ORAL | Status: DC | PRN
  Administered 2021-03-22: 50 mg via ORAL
  Filled 2021-03-22: qty 1

## 2021-03-22 MED ORDER — ACETAMINOPHEN 325 MG PO TABS: 650.0000 mg | ORAL_TABLET | Freq: Four times a day (QID) | ORAL | Status: DC | PRN

## 2021-03-22 MED ORDER — DIPHENHYDRAMINE HCL 50 MG PO CAPS
ORAL_CAPSULE | ORAL | Status: AC
  Administered 2021-03-22: 50 mg via ORAL
  Filled 2021-03-22: qty 1

## 2021-03-22 MED ORDER — ONDANSETRON 8 MG PO TBDP: 8.0000 mg | ORAL_TABLET | Freq: Once | ORAL | Status: DC

## 2021-03-22 MED ORDER — GLIMEPIRIDE 2 MG PO TABS
4.0000 mg | ORAL_TABLET | Freq: Once | ORAL | Status: AC
  Administered 2021-03-22: 4 mg via ORAL
  Filled 2021-03-22: qty 2

## 2021-03-22 MED ORDER — INSULIN DETEMIR 100 UNIT/ML ~~LOC~~ SOLN
40.0000 [IU] | Freq: Every day | SUBCUTANEOUS | Status: DC
  Administered 2021-03-22: 40 [IU] via SUBCUTANEOUS

## 2021-03-22 MED ORDER — ALBUTEROL SULFATE HFA 108 (90 BASE) MCG/ACT IN AERS: 1.0000 | INHALATION_SPRAY | Freq: Four times a day (QID) | RESPIRATORY_TRACT | Status: DC | PRN

## 2021-03-22 MED ORDER — AMLODIPINE BESYLATE 5 MG PO TABS
5.0000 mg | ORAL_TABLET | Freq: Once | ORAL | Status: AC
  Administered 2021-03-22: 5 mg via ORAL
  Filled 2021-03-22: qty 1

## 2021-03-22 MED ORDER — MAGNESIUM HYDROXIDE 400 MG/5ML PO SUSP: 30.0000 mL | Freq: Every day | ORAL | Status: DC | PRN

## 2021-03-22 MED ORDER — HALOPERIDOL 5 MG PO TABS: 5.0000 mg | ORAL_TABLET | Freq: Four times a day (QID) | ORAL | Status: DC | PRN

## 2021-03-22 MED ORDER — LORAZEPAM 1 MG PO TABS
2.0000 mg | ORAL_TABLET | Freq: Four times a day (QID) | ORAL | Status: DC | PRN
  Administered 2021-03-22 – 2021-03-23 (×2): 2 mg via ORAL
  Filled 2021-03-22: qty 2

## 2021-03-22 MED ORDER — HALOPERIDOL LACTATE 5 MG/ML IJ SOLN: 5.0000 mg | Freq: Four times a day (QID) | INTRAMUSCULAR | Status: DC | PRN

## 2021-03-22 MED ORDER — INSULIN ASPART 100 UNIT/ML IJ SOLN
0.0000 [IU] | Freq: Three times a day (TID) | INTRAMUSCULAR | Status: DC
  Administered 2021-03-22: 5 [IU] via SUBCUTANEOUS
  Administered 2021-03-22 – 2021-03-23 (×2): 9 [IU] via SUBCUTANEOUS
  Administered 2021-03-23: 09:00:00 5 [IU] via SUBCUTANEOUS

## 2021-03-22 MED ORDER — HYDROXYZINE HCL 25 MG PO TABS: 25.0000 mg | ORAL_TABLET | Freq: Three times a day (TID) | ORAL | Status: DC | PRN

## 2021-03-22 MED ORDER — LORAZEPAM 1 MG PO TABS
1.0000 mg | ORAL_TABLET | Freq: Once | ORAL | Status: AC
  Administered 2021-03-22: 1 mg via ORAL
  Filled 2021-03-22: qty 1

## 2021-03-22 MED ORDER — ALUM & MAG HYDROXIDE-SIMETH 200-200-20 MG/5ML PO SUSP: 30.0000 mL | ORAL | Status: DC | PRN

## 2021-03-22 NOTE — ED Notes (Signed)
Pt calm and cooperative. A & O x 4. No c/o pain or distress. Will continue to monitor for safety.

## 2021-03-22 NOTE — ED Provider Notes (Signed)
Behavioral Health Admission [facility])  Date: 03/22/21 Patient Name: [name] MRN: [id number] Chief Complaint:  Chief Complaint  Patient presents with   Suicidal           Diagnoses:  Final diagnoses:  Suicidal ideation  Homicidal ideation  Severe episode of recurrent major depressive disorder, without psychotic features ([place])    HPI:   [name] is a 30 year old female with a past psychiatric history significant for bipolar disorder, schizophrenia, and OCD who presents to [facility] as a walk-in by way of [facility].  Patient presents regarding issues with her family as well as stating that her boyfriend has been acting up.  She endorses having suicidal and homicidal ideation for the past 3 days.  In regards to suicidal ideations, patient states that she has a plan to overdose on her medications.  She expresses having homicidal ideations towards her boyfriend stating that she would cut his throat.  When asked what events precipitated her ideations, patient reported that her mother has been acting up and she has been arguing with both her mother and boyfriend.  Patient reports that she was recently discharged from [facility] [place] 4 to 6 weeks ago.  Patient endorses having a psychiatrist by the name of Dr. Sheppard Evens but states that she has not seen him in 2 to 3 months.  Patient states that the medications that she is currently taking have not been effective in the management of her psychiatric illnesses.  Patient endorses depression stating that she feels like she never gets any help for the management of her depression.  Patient endorses the following depressive symptoms: Crying spells, guilt/worthlessness, hopelessness, irritability.  Patient denies self-isolation but does endorse worsening anxiety.  Although patient states that she has been having issues with her mother, she does report that she endorses support through her mother.  Patient  endorses both suicidal and homicidal ideations.  She further endorses visual hallucinations stating that she often sees her enemies in the store.  He states that she often sees herself running into her ex-boyfriend and believes that her mother is harming her through witchcraft.  Patient endorses fair sleep and receives on average 5 to 6 hours of sleep each night.  Patient endorses normal appetite.  Patient denies alcohol consumption and illicit drug use.  Patient endorses tobacco use and smokes on average 4 to 5 packs/day.  Patient has a past history of suicide attempt that occurred 3 months ago via overdose.  Patient endorses engaging in self-harm stating that she engages in cutting behavior when getting real mad.  Patient endorses being a danger to herself and is unable to contract for safety.  PHQ 2-9:  Flowsheet Row Clinical Support from 07/05/2020 [facility] from 05/26/2020 in [place] DEPT ED from 05/22/2020 in [facility]  Thoughts that you would be better off dead, or of hurting yourself in some way Not at all More than half the days Nearly every day  PHQ-9 Total Score 4 18 19        Flowsheet Row ED from 03/22/2021 in [place] DEPT ED from 03/16/2021 in [place] DEPT ED from 03/15/2021 in [place] DEPT  C-SSRS RISK CATEGORY Error: Q3, 4, or 5 should not be populated when Q2 is No Error: Q3, 4, or 5 should not be populated when Q2 is No  Error: Q3, 4, or 5 should not be populated when Q2 is No        Total Time spent with patient: 20 minutes  Musculoskeletal  Strength & Muscle Tone: within normal limits Gait & Station: normal Patient leans: N/A  Psychiatric Specialty Exam  Presentation General Appearance: Appropriate for Environment; Casual  Eye Contact:Good  Speech:Clear  and Coherent  Speech Volume:Increased  Handedness:Right   Mood and Affect  Mood:Dysphoric; Irritable; Anxious; Depressed  Affect:Congruent; Flat; Depressed   Thought Process  Thought Processes:Disorganized  Descriptions of Associations:Circumstantial  Orientation:Full (Time, Place and Person)  Thought Content:Illogical (suicidal ideations)  Diagnosis of Schizophrenia or Schizoaffective disorder in past: No   Hallucinations:No data recorded Ideas of Reference:None  Suicidal Thoughts:No data recorded Homicidal Thoughts:No data recorded  Sensorium  Memory:Immediate Good; Recent Fair; Remote Fair  Judgment:Impaired  Insight:Lacking   Executive Functions  Concentration:Fair  Attention Span:Fair  [place] of Knowledge:Good  Language:Good   Psychomotor Activity  Psychomotor Activity:No data recorded  Assets  Assets:Communication Skills; Housing   Sleep  Sleep:No data recorded  No data recorded  Physical Exam Psychiatric:        Attention and Perception: Attention normal. She perceives visual hallucinations. She does not perceive auditory hallucinations.        Mood and Affect: Affect normal. Mood is anxious and depressed.        Speech: Speech normal.        Behavior: Behavior normal. Behavior is cooperative.        Thought Content: Thought content is paranoid. Thought content includes homicidal and suicidal ideation. Thought content includes suicidal plan.        Cognition and Memory: Cognition and memory normal.        Judgment: Judgment normal.   Review of Systems  Psychiatric/Behavioral:  Positive for depression, hallucinations and suicidal ideas. Negative for memory loss and substance abuse. The patient is nervous/anxious. The patient does not have insomnia.    Blood pressure (!) 137/99, pulse (!) 104, temperature 98.2 F (36.8 C), resp. rate 20, SpO2 100 %. There is no height or weight on file to calculate BMI.  Past Psychiatric  History:   Depression Anxiety Schizoaffective disorder (bipolar type)  Is the patient at risk to self? Yes  Has the patient been a risk to self in the past 6 months? Yes .    Has the patient been a risk to self within the distant past? Yes  Is the patient a risk to others? Yes   Has the patient been a risk to others in the past 6 months? No   Has the patient been a risk to others within the distant past?  Unsure  Past Medical History:  Past Medical History:  Diagnosis Date   Anxiety    Bipolar 1 disorder ([place])    Cognitive deficits    Depression    Diabetes mellitus without complication ([place])    Hypertension    Mental disorder    Mental health disorder    Obesity     Past Surgical History:  Procedure Laterality Date   CESAREAN SECTION     CESAREAN SECTION N/A 04/25/2013   Procedure: REPEAT CESAREAN SECTION;  Surgeon: [name], MD;  Location: [facility];  Service: Obstetrics;  Laterality: N/A;   MASS EXCISION N/A 06/03/2012   Procedure: EXCISION MASS;  Surgeon: [name], MD;  Location: [facility];  Service: ENT;  Laterality: N/A;  Excision uvula mass   TONSILLECTOMY N/A 06/03/2012  Procedure: TONSILLECTOMY;  Surgeon: [name], MD;  Location: [facility];  Service: ENT;  Laterality: N/A;   TONSILLECTOMY      Family History:  Family History  Problem Relation Age of Onset   Hypertension Mother    Diabetes Father     Social History:  Social History   Socioeconomic History   Marital status: Single    Spouse name: Not on file   Number of children: Not on file   Years of education: Not on file   Highest education level: Not on file  Occupational History   Not on file  Tobacco Use   Smoking status: Every Day    Types: Cigars   Smokeless tobacco: Never   Tobacco comments:    Pt declined  Vaping Use   Vaping Use: Never used  Substance and Sexual Activity   Alcohol use: Not Currently   Drug use: Not Currently    Types:  "Crack" cocaine, Other-see comments    Comment: Patient reports hx of smoking Crack   Sexual activity: Not Currently    Birth control/protection: None    Comment: occasionally  Other Topics Concern   Not on file  Social History Narrative   ** Merged History Encounter **       Social Determinants of Health   Financial Resource Strain: Not on file  Food Insecurity: Not on file  Transportation Needs: Not on file  Physical Activity: Not on file  Stress: Not on file  Social Connections: Not on file  Intimate Partner Violence: Not on file    SDOH:  SDOH Screenings   Alcohol Screen: Low Risk    Last Alcohol Screening Score (AUDIT): 3  Depression (PHQ2-9): Low Risk    PHQ-2 Score: 4  Financial Resource Strain: Not on file  Food Insecurity: Not on file  Housing: Not on file  Physical Activity: Not on file  Social Connections: Not on file  Stress: Not on file  Tobacco Use: High Risk   Smoking Tobacco Use: Every Day   Smokeless Tobacco Use: Never   Passive Exposure: Not on file  Transportation Needs: Not on file    Last Labs:  Admission on 03/22/2021  Component Date Value Ref Range Status   Preg Test, Ur 03/22/2021 NEGATIVE  NEGATIVE Final   Comment:        THE SENSITIVITY OF THIS METHODOLOGY IS >20 mIU/mL. Performed at [facility], [place] [address] [place], [place] [address]    SARS Coronavirus 2 Ag 03/22/2021 Negative  Negative Preliminary   SARSCOV2ONAVIRUS 2 AG 03/22/2021 NEGATIVE  NEGATIVE Final   Comment: (NOTE) SARS-CoV-2 antigen NOT DETECTED.   Negative results are presumptive.  Negative results do not preclude SARS-CoV-2 infection and should not be used as the sole basis for treatment or other patient management decisions, including infection  control decisions, particularly in the presence of clinical signs and  symptoms consistent with COVID-19, or in those who have been in contact with the virus.  Negative results must be combined  with clinical observations, patient history, and epidemiological information. The expected result is Negative.  Fact Sheet for Patients: HandmadeRecipes.com.cy  Fact Sheet for Healthcare Providers: FuneralLife.at  This test is not yet approved or cleared by the [place] FDA and  has been authorized for detection and/or diagnosis of SARS-CoV-2 by FDA under an Emergency Use Authorization (EUA).  This EUA will remain in effect (meaning this test can be used) for the duration of  the COV  ID-19 declaration under Section 564(b)(1) of the Act, 21 U.S.C. section 360bbb-3(b)(1), unless the authorization is terminated or revoked sooner.    Admission on 03/22/2021, Discharged on 03/22/2021  Component Date Value Ref Range Status   Color, Urine 03/22/2021 STRAW (A)  YELLOW Final   APPearance 03/22/2021 CLEAR  CLEAR Final   Specific Gravity, Urine 03/22/2021 1.026  1.005 - 1.030 Final   pH 03/22/2021 6.0  5.0 - 8.0 Final   Glucose, UA 03/22/2021 >=500 (A)  NEGATIVE mg/dL Final   Hgb urine dipstick 03/22/2021 NEGATIVE  NEGATIVE Final   Bilirubin Urine 03/22/2021 NEGATIVE  NEGATIVE Final   Ketones, ur 03/22/2021 NEGATIVE  NEGATIVE mg/dL Final   Protein, ur 03/22/2021 NEGATIVE  NEGATIVE mg/dL Final   Nitrite 03/22/2021 NEGATIVE  NEGATIVE Final   Leukocytes,Ua 03/22/2021 NEGATIVE  NEGATIVE Final   RBC / HPF 03/22/2021 0-5  0 - 5 RBC/hpf Final   WBC, UA 03/22/2021 0-5  0 - 5 WBC/hpf Final   Bacteria, UA 03/22/2021 NONE SEEN  NONE SEEN Final   Squamous Epithelial / LPF 03/22/2021 0-5  0 - 5 Final   Performed at [facility], [place] [address] [place], [place] [address]   Glucose-Capillary 03/22/2021 334 (H)  70 - 99 mg/dL Final   Glucose reference range applies only to samples taken after fasting for at least 8 hours.   I-stat hCG, quantitative 03/22/2021 <5.0  <5 mIU/mL Final   Comment 3 03/22/2021           Final   Comment:   GEST. AGE      CONC.  (mIU/mL)   <=1 WEEK        5 - 50     2 WEEKS       50 - 500     3 WEEKS       100 - 10,000     4 WEEKS     1,000 - 30,000        FEMALE AND NON-PREGNANT FEMALE:     LESS THAN 5 mIU/mL    WBC 03/22/2021 14.0 (H)  4.0 - 10.5 K/uL Final   RBC 03/22/2021 4.40  3.87 - 5.11 MIL/uL Final   Hemoglobin 03/22/2021 11.4 (L)  12.0 - 15.0 g/dL Final   HCT 03/22/2021 35.5 (L)  36.0 - 46.0 % Final   MCV 03/22/2021 80.7  80.0 - 100.0 fL Final   MCH 03/22/2021 25.9 (L)  26.0 - 34.0 pg Final   MCHC 03/22/2021 32.1  30.0 - 36.0 g/dL Final   RDW 03/22/2021 15.5  11.5 - 15.5 % Final   Platelets 03/22/2021 308  150 - 400 K/uL Final   nRBC 03/22/2021 0.0  0.0 - 0.2 % Final   Neutrophils Relative % 03/22/2021 61  % Final   Neutro Abs 03/22/2021 8.5 (H)  1.7 - 7.7 K/uL Final   Lymphocytes Relative 03/22/2021 30  % Final   Lymphs Abs 03/22/2021 4.2 (H)  0.7 - 4.0 K/uL Final   Monocytes Relative 03/22/2021 6  % Final   Monocytes Absolute 03/22/2021 0.9  0.1 - 1.0 K/uL Final   Eosinophils Relative 03/22/2021 3  % Final   Eosinophils Absolute 03/22/2021 0.4  0.0 - 0.5 K/uL Final   Basophils Relative 03/22/2021 0  % Final   Basophils Absolute 03/22/2021 0.1  0.0 - 0.1 K/uL Final   Immature Granulocytes 03/22/2021 0  % Final   Abs Immature Granulocytes 03/22/2021 0.05  0.00 - 0.07 K/uL Final  Performed at [facility], [place] [address] [place], [place] [address]   Sodium 03/22/2021 133 (L)  135 - 145 mmol/L Final   Potassium 03/22/2021 3.5  3.5 - 5.1 mmol/L Final   Chloride 03/22/2021 101  98 - 111 mmol/L Final   CO2 03/22/2021 23  22 - 32 mmol/L Final   Glucose, Bld 03/22/2021 330 (H)  70 - 99 mg/dL Final   Glucose reference range applies only to samples taken after fasting for at least 8 hours.   BUN 03/22/2021 9  6 - 20 mg/dL Final   Creatinine, Ser 03/22/2021 0.71  0.44 - 1.00 mg/dL Final   Calcium 03/22/2021 8.9  8.9 - 10.3 mg/dL Final    Total Protein 03/22/2021 7.3  6.5 - 8.1 g/dL Final   Albumin 03/22/2021 3.9  3.5 - 5.0 g/dL Final   AST 03/22/2021 23  15 - 41 U/L Final   ALT 03/22/2021 22  0 - 44 U/L Final   Alkaline Phosphatase 03/22/2021 65  38 - 126 U/L Final   Total Bilirubin 03/22/2021 0.4  0.3 - 1.2 mg/dL Final   GFR, Estimated 03/22/2021 >60  >60 mL/min Final   Comment: (NOTE) Calculated using the CKD-EPI Creatinine Equation (2021)    Anion gap 03/22/2021 9  5 - 15 Final   Performed at [facility], [place] [address], [place], [place] [address]   Lipase 03/22/2021 41  11 - 51 U/L Final   Performed at [facility], [place] [address] [place], [place] [address]  Admission on 03/16/2021, Discharged on 03/16/2021  Component Date Value Ref Range Status   Color, Urine 03/16/2021 STRAW (A)  YELLOW Final   APPearance 03/16/2021 HAZY (A)  CLEAR Final   Specific Gravity, Urine 03/16/2021 1.026  1.005 - 1.030 Final   pH 03/16/2021 6.0  5.0 - 8.0 Final   Glucose, UA 03/16/2021 >=500 (A)  NEGATIVE mg/dL Final   Hgb urine dipstick 03/16/2021 NEGATIVE  NEGATIVE Final   Bilirubin Urine 03/16/2021 NEGATIVE  NEGATIVE Final   Ketones, ur 03/16/2021 NEGATIVE  NEGATIVE mg/dL Final   Protein, ur 03/16/2021 NEGATIVE  NEGATIVE mg/dL Final   Nitrite 03/16/2021 NEGATIVE  NEGATIVE Final   Leukocytes,Ua 03/16/2021 MODERATE (A)  NEGATIVE Final   RBC / HPF 03/16/2021 6-10  0 - 5 RBC/hpf Final   WBC, UA 03/16/2021 6-10  0 - 5 WBC/hpf Final   Bacteria, UA 03/16/2021 RARE (A)  NONE SEEN Final   Squamous Epithelial / LPF 03/16/2021 6-10  0 - 5 Final   Performed at [facility], [place] [address], [place], [place] [address]   Preg Test, Ur 03/16/2021 NEGATIVE  NEGATIVE Final   Comment:        THE SENSITIVITY OF THIS METHODOLOGY IS >20 mIU/mL. Performed at [facility], [place] [address], [place], [place] [address]    Opiates 03/16/2021 NONE DETECTED  NONE DETECTED Final    Cocaine 03/16/2021 NONE DETECTED  NONE DETECTED Final   Benzodiazepines 03/16/2021 NONE DETECTED  NONE DETECTED Final   Amphetamines 03/16/2021 NONE DETECTED  NONE DETECTED Final   Tetrahydrocannabinol 03/16/2021 NONE DETECTED  NONE DETECTED Final   Barbiturates 03/16/2021 POSITIVE (A)  NONE DETECTED Final   Comment: (NOTE) DRUG SCREEN FOR MEDICAL PURPOSES ONLY.  IF CONFIRMATION IS NEEDED FOR ANY PURPOSE, NOTIFY LAB WITHIN 5 DAYS.  LOWEST DETECTABLE LIMITS FOR URINE DRUG SCREEN Drug Class  Cutoff (ng/mL) Amphetamine and metabolites    1000 Barbiturate and metabolites    200 Benzodiazepine                 371 Tricyclics and metabolites     300 Opiates and metabolites        300 Cocaine and metabolites        300 THC                            50 Performed at [facility], [place] [address], [place], [place] [address]    Yeast Wet Prep HPF POC 03/16/2021 PRESENT (A)  NONE SEEN Final   Trich, Wet Prep 03/16/2021 NONE SEEN  NONE SEEN Final   Clue Cells Wet Prep HPF POC 03/16/2021 PRESENT (A)  NONE SEEN Final   WBC, Wet Prep HPF POC 03/16/2021 >=10 (A)  <10 Final   Please note change in reference range.   Sperm 03/16/2021 NONE SEEN   Final   Performed at [facility], [place] [address], [place], [place] [address]   Neisseria Gonorrhea 03/16/2021 Negative   Final   Chlamydia 03/16/2021 Negative   Final   Comment 03/16/2021 Normal Reference [place] Chlamydia - Negative   Final   Comment 03/16/2021 Normal Reference Range Neisseria Gonorrhea - Negative   Final  Admission on 02/21/2021, Discharged on 02/24/2021  Component Date Value Ref Range Status   Sodium 02/21/2021 137  135 - 145 mmol/L Final   Potassium 02/21/2021 3.9  3.5 - 5.1 mmol/L Final   Chloride 02/21/2021 107  98 - 111 mmol/L Final   CO2 02/21/2021 22  22 - 32 mmol/L Final   Glucose, Bld 02/21/2021 119 (H)  70 - 99 mg/dL Final   Glucose reference range applies only to  samples taken after fasting for at least 8 hours.   BUN 02/21/2021 9  6 - 20 mg/dL Final   Creatinine, Ser 02/21/2021 0.85  0.44 - 1.00 mg/dL Final   Calcium 02/21/2021 8.8 (L)  8.9 - 10.3 mg/dL Final   Total Protein 02/21/2021 7.1  6.5 - 8.1 g/dL Final   Albumin 02/21/2021 3.9  3.5 - 5.0 g/dL Final   AST 02/21/2021 21  15 - 41 U/L Final   ALT 02/21/2021 17  0 - 44 U/L Final   Alkaline Phosphatase 02/21/2021 59  38 - 126 U/L Final   Total Bilirubin 02/21/2021 0.6  0.3 - 1.2 mg/dL Final   GFR, Estimated 02/21/2021 >60  >60 mL/min Final   Comment: (NOTE) Calculated using the CKD-EPI Creatinine Equation (2021)    Anion gap 02/21/2021 8  5 - 15 Final   Performed at [place] [address] [place], [place] [address]   Alcohol, Ethyl (B) 02/21/2021 <10  <10 mg/dL Final   Comment: (NOTE) Lowest detectable limit for serum alcohol is 10 mg/dL.  For medical purposes only. Performed at [facility], [place] [address], [place], [place] [address]    Salicylate Lvl 18/29/9371 <7.0 (L)  7.0 - 30.0 mg/dL Final   Performed at [place] [address], [place], [place] [address]   Acetaminophen (Tylenol), Serum 02/21/2021 <10 (L)  10 - 30 ug/mL Final   Comment: (NOTE) Therapeutic concentrations vary significantly. A range of 10-30 ug/mL  may be an effective concentration for many patients. However, some  are best treated at concentrations outside of this range. Acetaminophen concentrations >150  ug/mL at 4 hours after ingestion  and >50 ug/mL at 12 hours after ingestion are often associated with  toxic reactions.  Performed at [facility], [place] [address], [place], [place] [address]    WBC 02/21/2021 11.4 (H)  4.0 - 10.5 K/uL Final   RBC 02/21/2021 4.77  3.87 - 5.11 MIL/uL Final   Hemoglobin 02/21/2021 12.0  12.0 - 15.0 g/dL Final   HCT 02/21/2021 38.9  36.0 - 46.0 % Final   MCV 02/21/2021 81.6  80.0 - 100.0 fL Final   MCH 02/21/2021 25.2 (L)  26.0 - 34.0 pg  Final   MCHC 02/21/2021 30.8  30.0 - 36.0 g/dL Final   RDW 02/21/2021 16.1 (H)  11.5 - 15.5 % Final   Platelets 02/21/2021 237  150 - 400 K/uL Final   REPEATED TO VERIFY   nRBC 02/21/2021 0.0  0.0 - 0.2 % Final   Performed at [place] [address] [place], [place] [address]   Opiates 02/21/2021 NONE DETECTED  NONE DETECTED Final   Cocaine 02/21/2021 NONE DETECTED  NONE DETECTED Final   Benzodiazepines 02/21/2021 NONE DETECTED  NONE DETECTED Final   Amphetamines 02/21/2021 NONE DETECTED  NONE DETECTED Final   Tetrahydrocannabinol 02/21/2021 POSITIVE (A)  NONE DETECTED Final   Barbiturates 02/21/2021 POSITIVE (A)  NONE DETECTED Final   Comment: (NOTE) DRUG SCREEN FOR MEDICAL PURPOSES ONLY.  IF CONFIRMATION IS NEEDED FOR ANY PURPOSE, NOTIFY LAB WITHIN 5 DAYS.  LOWEST DETECTABLE LIMITS FOR URINE DRUG SCREEN Drug Class                     Cutoff (ng/mL) Amphetamine and metabolites    1000 Barbiturate and metabolites    200 Benzodiazepine                 616 Tricyclics and metabolites     300 Opiates and metabolites        300 Cocaine and metabolites        300 THC                            50 Performed at [facility], [place] [address], [place], [place] [address]    I-stat hCG, quantitative 02/21/2021 <5.0  <5 mIU/mL Final   Comment 3 02/21/2021          Final   Comment:   GEST. AGE      CONC.  (mIU/mL)   <=1 WEEK        5 - 50     2 WEEKS       50 - 500     3 WEEKS       100 - 10,000     4 WEEKS     1,000 - 30,000        FEMALE AND NON-PREGNANT FEMALE:     LESS THAN 5 mIU/mL    SARS Coronavirus 2 by RT PCR 02/21/2021 NEGATIVE  NEGATIVE Final   Comment: (NOTE) SARS-CoV-2 target nucleic acids are NOT DETECTED.  The SARS-CoV-2 RNA is generally detectable in upper respiratory specimens during the acute phase of infection. The lowest concentration of SARS-CoV-2 viral copies this assay can detect is 138 copies/mL. A negative result does not preclude  SARS-Cov-2 infection and should not be used as the sole basis for treatment or other patient management decisions. A negative result may occur with  improper specimen collection/handling, submission  of specimen other than nasopharyngeal swab, presence of viral mutation(s) within the areas targeted by this assay, and inadequate number of viral copies(<138 copies/mL). A negative result must be combined with clinical observations, patient history, and epidemiological information. The expected result is Negative.  Fact Sheet for Patients:  EntrepreneurPulse.com.au  Fact Sheet for Healthcare Providers:  IncredibleEmployment.be  This test is no                          t yet approved or cleared by the [place] FDA and  has been authorized for detection and/or diagnosis of SARS-CoV-2 by FDA under an Emergency Use Authorization (EUA). This EUA will remain  in effect (meaning this test can be used) for the duration of the COVID-19 declaration under Section 564(b)(1) of the Act, 21 U.S.C.section 360bbb-3(b)(1), unless the authorization is terminated  or revoked sooner.       Influenza A by PCR 02/21/2021 NEGATIVE  NEGATIVE Final   Influenza B by PCR 02/21/2021 NEGATIVE  NEGATIVE Final   Comment: (NOTE) The Xpert Xpress SARS-CoV-2/FLU/RSV plus assay is intended as an aid in the diagnosis of influenza from Nasopharyngeal swab specimens and should not be used as a sole basis for treatment. Nasal washings and aspirates are unacceptable for Xpert Xpress SARS-CoV-2/FLU/RSV testing.  Fact Sheet for Patients: EntrepreneurPulse.com.au  Fact Sheet for Healthcare Providers: IncredibleEmployment.be  This test is not yet approved or cleared by the [place] FDA and has been authorized for detection and/or diagnosis of SARS-CoV-2 by FDA under an Emergency Use Authorization (EUA). This EUA will remain in effect  (meaning this test can be used) for the duration of the COVID-19 declaration under Section 564(b)(1) of the Act, 21 U.S.C. section 360bbb-3(b)(1), unless the authorization is terminated or revoked.  Performed at [facility], [place] [address] [place], [place] [address]    Glucose-Capillary 02/23/2021 204 (H)  70 - 99 mg/dL Final   Glucose reference range applies only to samples taken after fasting for at least 8 hours.  Admission on 02/01/2021, Discharged on 02/01/2021  Component Date Value Ref Range Status   Sodium 02/01/2021 138  135 - 145 mmol/L Final   Potassium 02/01/2021 3.8  3.5 - 5.1 mmol/L Final   Chloride 02/01/2021 105  98 - 111 mmol/L Final   CO2 02/01/2021 25  22 - 32 mmol/L Final   Glucose, Bld 02/01/2021 186 (H)  70 - 99 mg/dL Final   Glucose reference range applies only to samples taken after fasting for at least 8 hours.   BUN 02/01/2021 8  6 - 20 mg/dL Final   Creatinine, Ser 02/01/2021 0.94  0.44 - 1.00 mg/dL Final   Calcium 02/01/2021 8.9  8.9 - 10.3 mg/dL Final   GFR, Estimated 02/01/2021 >60  >60 mL/min Final   Comment: (NOTE) Calculated using the CKD-EPI Creatinine Equation (2021)    Anion gap 02/01/2021 8  5 - 15 Final   Performed at [facility], [place] [address], [place], [place] [address]   WBC 02/01/2021 11.9 (H)  4.0 - 10.5 K/uL Final   RBC 02/01/2021 4.31  3.87 - 5.11 MIL/uL Final   Hemoglobin 02/01/2021 10.8 (L)  12.0 - 15.0 g/dL Final   HCT 02/01/2021 34.9 (L)  36.0 - 46.0 % Final   MCV 02/01/2021 81.0  80.0 - 100.0 fL Final   MCH 02/01/2021 25.1 (L)  26.0 - 34.0 pg Final   MCHC 02/01/2021 30.9  30.0 -  36.0 g/dL Final   RDW 02/01/2021 17.2 (H)  11.5 - 15.5 % Final   Platelets 02/01/2021 169  150 - 400 K/uL Final   nRBC 02/01/2021 0.0  0.0 - 0.2 % Final   Performed at [facility], [place] [address], [place], [place] [address]   Color, Urine 02/01/2021 AMBER (A)  YELLOW Final   BIOCHEMICALS MAY BE AFFECTED BY  COLOR   APPearance 02/01/2021 HAZY (A)  CLEAR Final   Specific Gravity, Urine 02/01/2021 1.033 (H)  1.005 - 1.030 Final   pH 02/01/2021 5.0  5.0 - 8.0 Final   Glucose, UA 02/01/2021 50 (A)  NEGATIVE mg/dL Final   Hgb urine dipstick 02/01/2021 LARGE (A)  NEGATIVE Final   Bilirubin Urine 02/01/2021 NEGATIVE  NEGATIVE Final   Ketones, ur 02/01/2021 20 (A)  NEGATIVE mg/dL Final   Protein, ur 02/01/2021 100 (A)  NEGATIVE mg/dL Final   Nitrite 02/01/2021 NEGATIVE  NEGATIVE Final   Leukocytes,Ua 02/01/2021 NEGATIVE  NEGATIVE Final   RBC / HPF 02/01/2021 >50 (H)  0 - 5 RBC/hpf Final   WBC, UA 02/01/2021 0-5  0 - 5 WBC/hpf Final   Bacteria, UA 02/01/2021 RARE (A)  NONE SEEN Final   Squamous Epithelial / LPF 02/01/2021 11-20  0 - 5 Final   Mucus 02/01/2021 PRESENT   Final   Hyaline Casts, UA 02/01/2021 PRESENT   Final   Performed at [facility], [place] [address], [place], [place] [address]   Glucose-Capillary 02/01/2021 182 (H)  70 - 99 mg/dL Final   Glucose reference range applies only to samples taken after fasting for at least 8 hours.   SARS Coronavirus 2 by RT PCR 02/01/2021 NEGATIVE  NEGATIVE Final   Comment: (NOTE) SARS-CoV-2 target nucleic acids are NOT DETECTED.  The SARS-CoV-2 RNA is generally detectable in upper respiratory specimens during the acute phase of infection. The lowest concentration of SARS-CoV-2 viral copies this assay can detect is 138 copies/mL. A negative result does not preclude SARS-Cov-2 infection and should not be used as the sole basis for treatment or other patient management decisions. A negative result may occur with  improper specimen collection/handling, submission of specimen other than nasopharyngeal swab, presence of viral mutation(s) within the areas targeted by this assay, and inadequate number of viral copies(<138 copies/mL). A negative result must be combined with clinical observations, patient history, and  epidemiological information. The expected result is Negative.  Fact Sheet for Patients:  EntrepreneurPulse.com.au  Fact Sheet for Healthcare Providers:  IncredibleEmployment.be  This test is no                          t yet approved or cleared by the [place] FDA and  has been authorized for detection and/or diagnosis of SARS-CoV-2 by FDA under an Emergency Use Authorization (EUA). This EUA will remain  in effect (meaning this test can be used) for the duration of the COVID-19 declaration under Section 564(b)(1) of the Act, 21 U.S.C.section 360bbb-3(b)(1), unless the authorization is terminated  or revoked sooner.       Influenza A by PCR 02/01/2021 NEGATIVE  NEGATIVE Final   Influenza B by PCR 02/01/2021 NEGATIVE  NEGATIVE Final   Comment: (NOTE) The Xpert Xpress SARS-CoV-2/FLU/RSV plus assay is intended as an aid in the diagnosis of influenza from Nasopharyngeal swab specimens and should not be used as a sole basis for treatment. Nasal washings and aspirates are unacceptable for Xpert Xpress  SARS-CoV-2/FLU/RSV testing.  Fact Sheet for Patients: EntrepreneurPulse.com.au  Fact Sheet for Healthcare Providers: IncredibleEmployment.be  This test is not yet approved or cleared by the [place] FDA and has been authorized for detection and/or diagnosis of SARS-CoV-2 by FDA under an Emergency Use Authorization (EUA). This EUA will remain in effect (meaning this test can be used) for the duration of the COVID-19 declaration under Section 564(b)(1) of the Act, 21 U.S.C. section 360bbb-3(b)(1), unless the authorization is terminated or revoked.  Performed at [facility], [place] [address] [place], [place] [address]   Admission on 01/31/2021, Discharged on 01/31/2021  Component Date Value Ref Range Status   Sodium 01/31/2021 144  135 - 145 mmol/L Final   Potassium 01/31/2021 3.9   3.5 - 5.1 mmol/L Final   Chloride 01/31/2021 110  98 - 111 mmol/L Final   CO2 01/31/2021 27  22 - 32 mmol/L Final   Glucose, Bld 01/31/2021 137 (H)  70 - 99 mg/dL Final   Glucose reference range applies only to samples taken after fasting for at least 8 hours.   BUN 01/31/2021 8  6 - 20 mg/dL Final   Creatinine, Ser 01/31/2021 0.82  0.44 - 1.00 mg/dL Final   Calcium 01/31/2021 9.4  8.9 - 10.3 mg/dL Final   Total Protein 01/31/2021 7.6  6.5 - 8.1 g/dL Final   Albumin 01/31/2021 3.9  3.5 - 5.0 g/dL Final   AST 01/31/2021 16  15 - 41 U/L Final   ALT 01/31/2021 10  0 - 44 U/L Final   Alkaline Phosphatase 01/31/2021 51  38 - 126 U/L Final   Total Bilirubin 01/31/2021 0.4  0.3 - 1.2 mg/dL Final   GFR, Estimated 01/31/2021 >60  >60 mL/min Final   Comment: (NOTE) Calculated using the CKD-EPI Creatinine Equation (2021)    Anion gap 01/31/2021 7  5 - 15 Final   Performed at [facility], [place] [address] [place], [place] [address]   WBC 01/31/2021 13.8 (H)  4.0 - 10.5 K/uL Final   RBC 01/31/2021 4.53  3.87 - 5.11 MIL/uL Final   Hemoglobin 01/31/2021 11.5 (L)  12.0 - 15.0 g/dL Final   HCT 01/31/2021 36.3  36.0 - 46.0 % Final   MCV 01/31/2021 80.1  80.0 - 100.0 fL Final   MCH 01/31/2021 25.4 (L)  26.0 - 34.0 pg Final   MCHC 01/31/2021 31.7  30.0 - 36.0 g/dL Final   RDW 01/31/2021 17.2 (H)  11.5 - 15.5 % Final   Platelets 01/31/2021 189  150 - 400 K/uL Final   nRBC 01/31/2021 0.0  0.0 - 0.2 % Final   Neutrophils Relative % 01/31/2021 78  % Final   Neutro Abs 01/31/2021 10.8 (H)  1.7 - 7.7 K/uL Final   Lymphocytes Relative 01/31/2021 12  % Final   Lymphs Abs 01/31/2021 1.6  0.7 - 4.0 K/uL Final   Monocytes Relative 01/31/2021 8  % Final   Monocytes Absolute 01/31/2021 1.1 (H)  0.1 - 1.0 K/uL Final   Eosinophils Relative 01/31/2021 2  % Final   Eosinophils Absolute 01/31/2021 0.2  0.0 - 0.5 K/uL Final   Basophils Relative 01/31/2021 0  % Final   Basophils Absolute 01/31/2021  0.0  0.0 - 0.1 K/uL Final   Immature Granulocytes 01/31/2021 0  % Final   Abs Immature Granulocytes 01/31/2021 0.06  0.00 - 0.07 K/uL Final   Performed at [facility], [place] [address] [place],  [address]  I-stat hCG, quantitative 01/31/2021 <5.0  <5 mIU/mL Final   Comment 3 01/31/2021          Final   Comment:   GEST. AGE      CONC.  (mIU/mL)   <=1 WEEK        5 - 50     2 WEEKS       50 - 500     3 WEEKS       100 - 10,000     4 WEEKS     1,000 - 30,000        FEMALE AND NON-PREGNANT FEMALE:     LESS THAN 5 mIU/mL   Admission on 01/28/2021, Discharged on 01/28/2021  Component Date Value Ref Range Status   Preg Test, Ur 01/28/2021 NEGATIVE  NEGATIVE Final   Comment:        THE SENSITIVITY OF THIS METHODOLOGY IS >24 mIU/mL    Glucose-Capillary 01/28/2021 114 (H)  70 - 99 mg/dL Final   Glucose reference range applies only to samples taken after fasting for at least 8 hours.   Glucose, UA 01/28/2021 NEGATIVE  NEGATIVE mg/dL Final   Bilirubin Urine 01/28/2021 NEGATIVE  NEGATIVE Final   Ketones, ur 01/28/2021 NEGATIVE  NEGATIVE mg/dL Final   Specific Gravity, Urine 01/28/2021 1.020  1.005 - 1.030 Final   Hgb urine dipstick 01/28/2021 NEGATIVE  NEGATIVE Final   pH 01/28/2021 7.0  5.0 - 8.0 Final   Protein, ur 01/28/2021 NEGATIVE  NEGATIVE mg/dL Final   Urobilinogen, UA 01/28/2021 0.2  0.0 - 1.0 mg/dL Final   Nitrite 01/28/2021 NEGATIVE  NEGATIVE Final   Leukocytes,Ua 01/28/2021 NEGATIVE  NEGATIVE Final   Biochemical Testing Only. Please order routine urinalysis from main lab if confirmatory testing is needed.   SARS Coronavirus 2 01/28/2021 NEGATIVE  NEGATIVE Final   Comment: (NOTE) SARS-CoV-2 target nucleic acids are NOT DETECTED.  The SARS-CoV-2 RNA is generally detectable in upper and lower respiratory specimens during the acute phase of infection. Negative results do not preclude SARS-CoV-2 infection, do not rule out co-infections with other  pathogens, and should not be used as the sole basis for treatment or other patient management decisions. Negative results must be combined with clinical observations, patient history, and epidemiological information. The expected result is Negative.  Fact Sheet for Patients: SugarRoll.be  Fact Sheet for Healthcare Providers: https://www.woods-mathews.com/  This test is not yet approved or cleared by the [place] FDA and  has been authorized for detection and/or diagnosis of SARS-CoV-2 by FDA under an Emergency Use Authorization (EUA). This EUA will remain  in effect (meaning this test can be used) for the duration of the COVID-19 declaration under Se                          ction 564(b)(1) of the Act, 21 U.S.C. section 360bbb-3(b)(1), unless the authorization is terminated or revoked sooner.  Performed at [facility], [place] [address] [place], [place] [address]   Admission on 12/31/2020, Discharged on 12/31/2020  Component Date Value Ref Range Status   Bacterial Vaginitis (gardnerella) 12/31/2020 Positive (A)   Final   Chlamydia 12/31/2020 Negative   Final   Neisseria Gonorrhea 12/31/2020 Negative   Final   Candida Vaginitis 12/31/2020 Positive (A)   Final   Candida Glabrata 12/31/2020 Negative   Final   Trichomonas 12/31/2020 Negative   Final   Comment 12/31/2020 Normal Reference Range Bacterial Vaginosis - Negative  Final   Comment 12/31/2020 Normal Reference Ranger Chlamydia - Negative   Final   Comment 12/31/2020 Normal Reference Range Neisseria Gonorrhea - Negative   Final   Comment 12/31/2020 Normal Reference Range Candida Species - Negative   Final   Comment 12/31/2020 Normal Reference Range Candida Galbrata - Negative   Final   Comment 12/31/2020 Normal Reference Range Trichomonas - Negative   Final   Specimen Description 12/31/2020 URINE, CLEAN CATCH   Final   Special Requests 12/31/2020    Final                    Value:NONE Performed at [facility], [place] [address], [place], [place] [address]    Culture 12/31/2020 MULTIPLE SPECIES PRESENT, SUGGEST RECOLLECTION (A)   Final   Report Status 12/31/2020 01/01/2021 FINAL   Final   Glucose-Capillary 12/31/2020 100 (H)  70 - 99 mg/dL Final   Glucose reference range applies only to samples taken after fasting for at least 8 hours.   Glucose, UA 12/31/2020 NEGATIVE  NEGATIVE mg/dL Final   Bilirubin Urine 12/31/2020 NEGATIVE  NEGATIVE Final   Ketones, ur 12/31/2020 TRACE (A)  NEGATIVE mg/dL Final   Specific Gravity, Urine 12/31/2020 1.020  1.005 - 1.030 Final   Hgb urine dipstick 12/31/2020 NEGATIVE  NEGATIVE Final   pH 12/31/2020 6.5  5.0 - 8.0 Final   Protein, ur 12/31/2020 NEGATIVE  NEGATIVE mg/dL Final   Urobilinogen, UA 12/31/2020 0.2  0.0 - 1.0 mg/dL Final   Nitrite 12/31/2020 NEGATIVE  NEGATIVE Final   Leukocytes,Ua 12/31/2020 NEGATIVE  NEGATIVE Final   Biochemical Testing Only. Please order routine urinalysis from main lab if confirmatory testing is needed.  Admission on 12/11/2020, Discharged on 12/11/2020  Component Date Value Ref Range Status   Glucose-Capillary 12/11/2020 124 (H)  70 - 99 mg/dL Final   Glucose reference range applies only to samples taken after fasting for at least 8 hours.  Admission on 11/28/2020, Discharged on 11/29/2020  Component Date Value Ref Range Status   SARS Coronavirus 2 by RT PCR 11/28/2020 NEGATIVE  NEGATIVE Final   Comment: (NOTE) SARS-CoV-2 target nucleic acids are NOT DETECTED.  The SARS-CoV-2 RNA is generally detectable in upper respiratory specimens during the acute phase of infection. The lowest concentration of SARS-CoV-2 viral copies this assay can detect is 138 copies/mL. A negative result does not preclude SARS-Cov-2 infection and should not be used as the sole basis for treatment or other patient management decisions. A negative result may occur with  improper specimen collection/handling,  submission of specimen other than nasopharyngeal swab, presence of viral mutation(s) within the areas targeted by this assay, and inadequate number of viral copies(<138 copies/mL). A negative result must be combined with clinical observations, patient history, and epidemiological information. The expected result is Negative.  Fact Sheet for Patients:  EntrepreneurPulse.com.au  Fact Sheet for Healthcare Providers:  IncredibleEmployment.be  This test is no                          t yet approved or cleared by the [place] FDA and  has been authorized for detection and/or diagnosis of SARS-CoV-2 by FDA under an Emergency Use Authorization (EUA). This EUA will remain  in effect (meaning this test can be used) for the duration of the COVID-19 declaration under Section 564(b)(1) of the Act, 21 U.S.C.section 360bbb-3(b)(1), unless the authorization is terminated  or revoked sooner.  Influenza A by PCR 11/28/2020 NEGATIVE  NEGATIVE Final   Influenza B by PCR 11/28/2020 NEGATIVE  NEGATIVE Final   Comment: (NOTE) The Xpert Xpress SARS-CoV-2/FLU/RSV plus assay is intended as an aid in the diagnosis of influenza from Nasopharyngeal swab specimens and should not be used as a sole basis for treatment. Nasal washings and aspirates are unacceptable for Xpert Xpress SARS-CoV-2/FLU/RSV testing.  Fact Sheet for Patients: EntrepreneurPulse.com.au  Fact Sheet for Healthcare Providers: IncredibleEmployment.be  This test is not yet approved or cleared by the [place] FDA and has been authorized for detection and/or diagnosis of SARS-CoV-2 by FDA under an Emergency Use Authorization (EUA). This EUA will remain in effect (meaning this test can be used) for the duration of the COVID-19 declaration under Section 564(b)(1) of the Act, 21 U.S.C. section 360bbb-3(b)(1), unless the authorization is terminated  or revoked.  Performed at [facility], [place] [address] [place], [place] [address]    Sodium 11/28/2020 140  135 - 145 mmol/L Final   Potassium 11/28/2020 3.8  3.5 - 5.1 mmol/L Final   Chloride 11/28/2020 108  98 - 111 mmol/L Final   CO2 11/28/2020 28  22 - 32 mmol/L Final   Glucose, Bld 11/28/2020 119 (H)  70 - 99 mg/dL Final   Glucose reference range applies only to samples taken after fasting for at least 8 hours.   BUN 11/28/2020 6  6 - 20 mg/dL Final   Creatinine, Ser 11/28/2020 0.76  0.44 - 1.00 mg/dL Final   Calcium 11/28/2020 9.0  8.9 - 10.3 mg/dL Final   Total Protein 11/28/2020 7.1  6.5 - 8.1 g/dL Final   Albumin 11/28/2020 3.8  3.5 - 5.0 g/dL Final   AST 11/28/2020 27  15 - 41 U/L Final   ALT 11/28/2020 18  0 - 44 U/L Final   Alkaline Phosphatase 11/28/2020 53  38 - 126 U/L Final   Total Bilirubin 11/28/2020 0.6  0.3 - 1.2 mg/dL Final   GFR, Estimated 11/28/2020 >60  >60 mL/min Final   Comment: (NOTE) Calculated using the CKD-EPI Creatinine Equation (2021)    Anion gap 11/28/2020 4 (L)  5 - 15 Final   Performed at [place] [address], [place], [place] [address]   Alcohol, Ethyl (B) 11/28/2020 <10  <10 mg/dL Final   Comment: (NOTE) Lowest detectable limit for serum alcohol is 10 mg/dL.  For medical purposes only. Performed at [facility], [place] [address] [place], [place] [address]    Opiates 11/28/2020 NONE DETECTED  NONE DETECTED Final   Cocaine 11/28/2020 NONE DETECTED  NONE DETECTED Final   Benzodiazepines 11/28/2020 NONE DETECTED  NONE DETECTED Final   Amphetamines 11/28/2020 NONE DETECTED  NONE DETECTED Final   Tetrahydrocannabinol 11/28/2020 NONE DETECTED  NONE DETECTED Final   Barbiturates 11/28/2020 POSITIVE (A)  NONE DETECTED Final   Comment: (NOTE) DRUG SCREEN FOR MEDICAL PURPOSES ONLY.  IF CONFIRMATION IS NEEDED FOR ANY PURPOSE, NOTIFY LAB WITHIN 5 DAYS.  LOWEST DETECTABLE LIMITS FOR URINE DRUG SCREEN Drug Class                      Cutoff (ng/mL) Amphetamine and metabolites    1000 Barbiturate and metabolites    200 Benzodiazepine                 220 Tricyclics and metabolites     300 Opiates and metabolites        300 Cocaine  and metabolites        300 THC                            50 Performed at [facility], [place] [address], [place], [place] [address]    WBC 11/28/2020 10.9 (H)  4.0 - 10.5 K/uL Final   RBC 11/28/2020 4.50  3.87 - 5.11 MIL/uL Final   Hemoglobin 11/28/2020 11.1 (L)  12.0 - 15.0 g/dL Final   HCT 11/28/2020 35.1 (L)  36.0 - 46.0 % Final   MCV 11/28/2020 78.0 (L)  80.0 - 100.0 fL Final   MCH 11/28/2020 24.7 (L)  26.0 - 34.0 pg Final   MCHC 11/28/2020 31.6  30.0 - 36.0 g/dL Final   RDW 11/28/2020 17.5 (H)  11.5 - 15.5 % Final   Platelets 11/28/2020 270  150 - 400 K/uL Final   REPEATED TO VERIFY   nRBC 11/28/2020 0.0  0.0 - 0.2 % Final   Neutrophils Relative % 11/28/2020 60  % Final   Neutro Abs 11/28/2020 6.6  1.7 - 7.7 K/uL Final   Lymphocytes Relative 11/28/2020 31  % Final   Lymphs Abs 11/28/2020 3.4  0.7 - 4.0 K/uL Final   Monocytes Relative 11/28/2020 5  % Final   Monocytes Absolute 11/28/2020 0.6  0.1 - 1.0 K/uL Final   Eosinophils Relative 11/28/2020 3  % Final   Eosinophils Absolute 11/28/2020 0.3  0.0 - 0.5 K/uL Final   Basophils Relative 11/28/2020 1  % Final   Basophils Absolute 11/28/2020 0.1  0.0 - 0.1 K/uL Final   Immature Granulocytes 11/28/2020 0  % Final   Abs Immature Granulocytes 11/28/2020 0.03  0.00 - 0.07 K/uL Final   Performed at [facility], [place] [address] [place], [place] [address]   I-stat hCG, quantitative 11/28/2020 <5.0  <5 mIU/mL Final   Comment 3 11/28/2020          Final   Comment:   GEST. AGE      CONC.  (mIU/mL)   <=1 WEEK        5 - 50     2 WEEKS       50 - 500     3 WEEKS       100 - 10,000     4 WEEKS     1,000 - 30,000        FEMALE AND NON-PREGNANT FEMALE:     LESS THAN 5 mIU/mL    Glucose-Capillary 11/28/2020 96  70 -  99 mg/dL Final   Glucose reference range applies only to samples taken after fasting for at least 8 hours.   Comment 1 11/28/2020 Notify RN   Final   Comment 2 11/28/2020 Document in Chart   Final   Glucose-Capillary 11/29/2020 104 (H)  70 - 99 mg/dL Final   Glucose reference range applies only to samples taken after fasting for at least 8 hours.  There may be more visits with results that are not included.    Allergies: Wellbutrin [bupropion], Omnipaque [iohexol], Contrast media [iodinated diagnostic agents], Depakote [divalproex sodium], Hydroxyzine, and Penicillin g  PTA Medications: (Not in a hospital admission)   Medical Decision Making  Patient endorses suicidal ideations with a plan to overdose as well as homicidal ideations towards her boyfriend.  Patient is unable to contract for safety and reports being a danger to herself.  Patient has a past history of multiple  hospitalizations and admissions.  Based on my evaluation, patient meets criteria for admission to [facility] for continuous observation.  Admission labs to be ordered and initiated.  Patient's home medications to be to be initiated accordingly.  Patient to be reevaluated by psychiatry in the morning.  Recommendations  Based on my evaluation the patient does not appear to have an emergency medical condition.  Patient to be admitted to [facility] for continuous observation.  Malachy Mood, PA 03/22/21  5:44 AM

## 2021-03-22 NOTE — Progress Notes (Signed)
   03/22/21 0350  [name]  [name]  Stacy Long Has This Been Causing You Problems? 1 wk - 1 month  Have You Recently Had Any Thoughts About Hurting Yourself? Yes  Stacy long ago did you have thoughts about hurting yourself? [name] is a 30 year old female presenting voluntary as a walk-in to [name] due to [name]. Patient reported SI with plan to overdose on prescription medications. Patient reported being at home with boyfriend while she was going to attempt suicide by overdosing on medications, instead patient called the police and was brought to [name]. Patient reported HI towards boyfriend, as they had an argument earlier today. Patient reported onset of SI was 3 days ago. Patient reported triggers and stressors include family problems of being annoyed by her mother and boyfriend. Patient history of suicide attempt of overdose 3 months ago. Patient reported [name]-harming behaviors of cutting wrist, last time was recently and only cutting when angry. Patient reported visual hallucinations of seeing enemies where ever she goes and not being able to depict if it reality or not. Patient stated "I feel like my enemies are following me". Patient denied auditory hallucinations. Patient reported worsening depressive symptoms.     Patient reported being discharged from [name] 3-4 weeks ago. Patient is currently seeing [name] for medication management, last time was 2-3 months ago. Patient reports psych medications are not working. Patient reported being prescribed Haldol and Lithium.     Patient reported living alone. Patient denied access to guns. Patient was very anxious and cooperative during assessment. Patient unable to contract for safety.  Are You Planning to Commit Suicide/Harm Yourself At This time? Yes  Have you Recently Had Thoughts About Hurting Someone [name]? Yes  Stacy long ago did you have thoughts of harming others? today  Are You  Planning To Harm Someone At This Time? Yes  Explanation: "I have been arguing with my boyfriend"  Are you currently experiencing any auditory, visual or other hallucinations? No  Have You Used Any Alcohol or Drugs in the Past 24 Hours? No  Clinician description of patient physical appearance/behavior: casual and cooperative  What Do You Feel Would Help You the Most Today? Treatment for Depression or other mood problem  If access to [facility] was not available, would you have sought care in the Emergency Department? Yes  Determination of Need Emergent (2 hours)  Options For Referral Medication Management;Inpatient Hospitalization;Outpatient Therapy

## 2021-03-22 NOTE — ED Notes (Signed)
Pt verbalized that she is upset because her boyfriend cannot be admitted to the same facility as her.  Pt demanding to speak with someone now, stating she needs more Ativan that the 1 mg of Ativan she had is not working.  AC [place] explained to pt that boyfriend and girlfriend cannot be allowed on the same unit together.

## 2021-03-22 NOTE — ED Provider Notes (Signed)
[facility] DEPT Provider Note   CSN: [id number] Arrival date & time: 03/22/21  0010     History Chief Complaint  Patient presents with   Hyperglycemia    [name] is a 30 y.o. female.  The history is provided by the patient.  Hyperglycemia She has history of hypertension, diabetes, hyperlipidemia, schizoaffective disorder, bipolar disorder and comes in complaining of nausea for the last 3 days and intermittent suprapubic pain for the last 3 days.  Pain is sharp and momentary, only lasting 2 to 3 seconds before resolving.  It can be as severe as 7/10.  Nothing seems to make it better, nothing makes it worse.  She denies fever, chills, sweats.  She denies constipation or diarrhea.  She has noted some increased urinary frequency, but no urgency or dysuria or tenesmus.  She has noted that her blood sugar has been running high.  She has vomited twice in the last 3 days, none today.  There has been no treatment at home.  She denies any sick contacts.   Past Medical History:  Diagnosis Date   Anxiety    Bipolar 1 disorder ([place])    Cognitive deficits    Depression    Diabetes mellitus without complication ([place])    Hypertension    Mental disorder    Mental health disorder    Obesity     Patient Active Problem List   Diagnosis Date Noted   Syphilis 07/15/2020   Malingering 06/05/2020   Gastroesophageal reflux disease 05/04/2020   Hyperglycemia due to type 2 diabetes mellitus ([place]) 05/04/2020   Long term (current) use of insulin ([place]) 05/04/2020   Migraine without aura 05/04/2020   Morbid obesity ([place]) 05/04/2020   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 2 diabetes mellitus ([place]) 05/04/2020   Prolapsed internal hemorrhoids 05/04/2020   Vitamin D deficiency 05/04/2020   Other symptoms and signs involving cognitive functions and awareness 05/04/2020   Suicide attempt ([place])    Anxiety state 03/06/2020   Schizophrenia ([place]) 09/13/2019   Bipolar I disorder,  most recent episode depressed ([place]) 06/23/2019   MDD (major depressive disorder) 10/10/2018   Schizoaffective disorder, bipolar type ([place]) 09/25/2018   Affective psychosis, bipolar ([place]) 06/13/2018   HTN (hypertension) 05/03/2018   Tobacco use disorder 05/03/2018   Adjustment disorder with emotional disturbance 01/02/2018   Schizophrenia, 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   Moderate bipolar I disorder, most recent episode depressed ([place])    Psychosis ([place])    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/2017   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   OCD (obsessive compulsive disorder) 10/05/2016   Major depressive disorder, recurrent episode, mild ([place]) 05/04/2016   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   Learning disability 07/18/2015   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/2015   Diabetes mellitus ([place]) 07/18/2015   MDD (major depressive disorder), recurrent, severe, 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/2015   Hyperlipidemia 07/18/2015   Severe episode of recurrent major depressive disorder, without psychotic features ([place])    Suicidal ideation    Drug overdose    Cognitive deficits 10/12/2012   Generalized anxiety disorder 06/28/2012    Past Surgical History:  Procedure Laterality Date   CESAREAN SECTION     CESAREAN SECTION N/A 04/25/2013   Procedure: REPEAT CESAREAN SECTION;  Surgeon: [name], MD;  Location: [facility];  Service: Obstetrics;  Laterality: N/A;   MASS EXCISION N/A 06/03/2012   Procedure: EXCISION MASS;  Surgeon: [name], MD;  Location: [facility];  Service: ENT;  Laterality: N/A;  Excision uvula mass   TONSILLECTOMY N/A 06/03/2012   Procedure: TONSILLECTOMY;  Surgeon: [name], MD;  Location: [facility];  Service: ENT;  Laterality: N/A;   TONSILLECTOMY       OB History     Gravida  3   Para  3   Term  3   Preterm  0   AB  0   Living  3      SAB  0   IAB  0   Ectopic  0   Multiple       Live Births  3           Family History  Problem Relation Age of Onset   Hypertension Mother    Diabetes Father     Social History   Tobacco Use   Smoking status: Every Day    Types: Cigars   Smokeless tobacco: Never   Tobacco comments:    Pt declined  Vaping Use   Vaping Use: Never used  Substance Use Topics   Alcohol use: Not Currently   Drug use: Not Currently    Types: "Crack" cocaine, Other-see comments    Comment: Patient reports hx of smoking Crack    Home Medications Prior to Admission medications   Medication Sig Start Date End Date Taking? Authorizing Provider  acetaminophen (TYLENOL) 500 MG tablet Take 1,000 mg by mouth every 6 (six) hours as needed for mild pain or headache.    [provider]  albuterol (VENTOLIN HFA) 108 (90 Base) MCG/ACT inhaler Inhale 1-2 puffs into the lungs every 6 (six) hours as needed for wheezing or shortness of breath. 01/11/21   Pearson Forster, NP  amLODipine (NORVASC) 5 MG tablet Take 5 mg by mouth daily.    [provider]  cholecalciferol (VITAMIN D) 25 MCG (1000 UNIT) tablet Take 1,000 Units by mouth daily. 04/30/20   [provider]  fluticasone (FLONASE) 50 MCG/ACT nasal spray Place 2 sprays into both nostrils daily. Patient taking differently: Place 2 sprays into both nostrils daily as needed for allergies. 01/11/21   Pearson Forster, NP  glimepiride (AMARYL) 4 MG tablet Take 4 mg by mouth daily.    [provider]  haloperidol (HALDOL) 10 MG tablet Take 10-20 mg by mouth See admin instructions. 10 mg in the morning  20 mg at bedtime 05/09/20   [provider]  haloperidol decanoate (HALDOL DECANOATE) 100 MG/ML injection Inject 150 mg into the muscle every 28 (twenty-eight) days. Inject 150mg  (1.89ml) every 28 days.    [provider]  insulin detemir (LEVEMIR) 100 UNIT/ML injection Inject 0.4 mLs (40 Units total) into the skin 2 (two) times daily. 05/05/20   Montine Circle, PA-C   lithium carbonate (ESKALITH) 450 MG CR tablet Take 450 mg by mouth daily.    [provider]  metroNIDAZOLE (FLAGYL) 500 MG tablet Take 1 tablet (500 mg total) by mouth 2 (two) times daily. One po bid x 7 days 03/16/21   Drenda Freeze, MD  omeprazole (PRILOSEC) 20 MG capsule Take 20 mg by mouth daily.    [provider]  ondansetron (ZOFRAN ODT) 4 MG disintegrating tablet Take 1 tablet (4 mg total) by mouth every 8 (eight) hours as needed for nausea or vomiting. 01/28/21   Volney American, PA-C  simvastatin (ZOCOR) 20 MG tablet Take 20 mg by mouth daily.    [provider]  vitamin B-12 (CYANOCOBALAMIN) 1000 MCG tablet Take  1,000 mcg by mouth daily. 11/13/19   [provider]    Allergies    Wellbutrin [bupropion], Omnipaque [iohexol], Contrast media [iodinated diagnostic agents], Depakote [divalproex sodium], Hydroxyzine, and Penicillin g  Review of Systems   Review of Systems  All other systems reviewed and are negative.  Physical Exam Updated Vital Signs BP (!) 163/100 (BP Location: Left Arm)   Pulse 83   Temp 98.5 F (36.9 C) (Oral)   Resp 17   LMP  (LMP Unknown)   SpO2 98%   Physical Exam Vitals and nursing note reviewed.  30 year old female, resting comfortably and in no acute distress. Vital signs are significant for elevated blood pressure. Oxygen saturation is 98%, which is normal. Head is normocephalic and atraumatic. PERRLA, EOMI. Oropharynx is clear. Neck is nontender and supple without adenopathy or JVD. Back is nontender and there is no CVA tenderness. Lungs are clear without rales, wheezes, or rhonchi. Chest is nontender. Heart has regular rate and rhythm without murmur. Abdomen is soft, flat, nontender without masses or hepatosplenomegaly and peristalsis is hypoactive. Extremities have no cyanosis or edema, full range of motion is present. Skin is warm and dry without rash. Neurologic: Mental status is normal,  cranial nerves are intact, there are no motor or sensory deficits.  ED Results / Procedures / Treatments   Labs (all labs ordered are listed, but only abnormal results are displayed) Labs Reviewed  URINALYSIS, ROUTINE W REFLEX MICROSCOPIC - Abnormal; Notable for the following components:      Result Value   Color, Urine STRAW (*)    Glucose, UA >=500 (*)    All other components within normal limits  CBC WITH DIFFERENTIAL/PLATELET - Abnormal; Notable for the following components:   WBC 14.0 (*)    Hemoglobin 11.4 (*)    HCT 35.5 (*)    MCH 25.9 (*)    Neutro Abs 8.5 (*)    Lymphs Abs 4.2 (*)    All other components within normal limits  COMPREHENSIVE METABOLIC PANEL - Abnormal; Notable for the following components:   Sodium 133 (*)    Glucose, Bld 330 (*)    All other components within normal limits  CBG MONITORING, ED - Abnormal; Notable for the following components:   Glucose-Capillary 334 (*)    All other components within normal limits  LIPASE, BLOOD  I-STAT BETA HCG BLOOD, ED ([facility], [facility], [facility] ONLY)  CBG MONITORING, ED  CBG MONITORING, ED   Procedures Procedures   Medications Ordered in ED Medications  ondansetron (ZOFRAN-ODT) disintegrating tablet 8 mg (has no administration in time range)  insulin aspart (novoLOG) injection 10 Units (has no administration in time range)    ED Course  I have reviewed the triage vital signs and the nursing notes.  Pertinent lab results that were available during my care of the patient were reviewed by me and considered in my medical decision making (see chart for details).   MDM Rules/Calculators/A&P                         Nausea with occasional vomiting of uncertain cause.  Abdominal pain which appears to be benign.  We will check screening labs and give ondansetron for nausea.  No indication for advanced imaging today.  Glucose is elevated at 334, she is given a dose of insulin.  Old records are reviewed, and she has several prior ED  visits with similar complaints.  Labs are reassuring.  She  has stable anemia, borderline hyponatremia which is not felt to be clinically significant.  However, when we attempted to find the patient to repeat her blood glucose following her insulin dose, she apparently had left the department without telling anybody.  Final Clinical Impression(s) / ED Diagnoses Final diagnoses:  Hyperglycemia  Nausea  Suprapubic pain  Normocytic anemia  Hyponatremia  Elevated blood pressure reading with diagnosis of hypertension    Rx / DC Orders ED Discharge Orders     None        Delora Fuel, MD 03/47/42 0532

## 2021-03-22 NOTE — Progress Notes (Signed)
Pt is awake, alert and oriented. Administered scheduled meds with no incident. Monitoring for pt's safety.

## 2021-03-22 NOTE — ED Notes (Signed)
[facility] called and states that patient went over there.

## 2021-03-22 NOTE — Progress Notes (Signed)
Pt's CBG was 364. Ludger Nutting, NP notified in person. SS ordered. Will continue to monitor.

## 2021-03-22 NOTE — Progress Notes (Signed)
Pt is asleep. Respirations are even and unlabored. No signs of acute distress noted. Staff will monitor for pt's safety. 

## 2021-03-22 NOTE — ED Notes (Signed)
Pt stated that she was still HI towards her boyfriend and SI with a plan to OD. Requested for Pt to give urine sample for UDS when she goes to the restroom again. Safety maintained and will continue to monitor.

## 2021-03-22 NOTE — ED Triage Notes (Signed)
Pt BIB [facility] from home. Complaining of hyperglycemia and generalized abdominal pain. CBG 411 with EMS.

## 2021-03-22 NOTE — ED Notes (Signed)
Urine sample collected for UDS. Results charted.

## 2021-03-22 NOTE — ED Notes (Signed)
Pt sleeping@this time. Breathing even and unlabored. Will continue to monitor for safety 

## 2021-03-22 NOTE — ED Notes (Addendum)
Pt presents with suicidal ideations, plan to overdose on Rx meds and HI towards boyfriend, no plan noted.  Pt eval earlier last night at [facility], presents to [facility] brought in by [employer].  Pt calm & cooperative, no distress noted.  Monitoring for safety.

## 2021-03-22 NOTE — Progress Notes (Signed)
   03/22/21 0350  [place] (Walk-ins at [facility] only)  How Did You Hear About [place]? Self  What Is the Reason for Your Visit/Call Today? [name] is a 30 year old female presenting voluntary as a walk-in to [facility] due to [place] and [place]. Patient reported SI with plan to overdose on prescription medications. Patient reported being at home with boyfriend while she was going to attempt suicide by overdosing on medications, instead patient called the police and was brought to [facility]. Patient reported HI towards boyfriend, as they had an argument earlier today. Patient reported onset of SI was 3 days ago. Patient reported triggers and stressors include family problems of being annoyed by her mother and boyfriend. Patient history of suicide attempt of overdose 3 months ago. Patient reported self-harming behaviors of cutting wrist, last time was recently and only cutting when angry. Patient reported visual hallucinations of seeing enemies where ever she goes and not being able to depict if it reality or not. Patient stated "I feel like my enemies are following me". Patient denied auditory hallucinations. Patient reported worsening depressive symptoms.     Patient reported being discharged from [facility] 3-4 weeks ago. Patient is currently seeing [name] for medication management, last time was 2-3 months ago. Patient reports psych medications are not working. Patient reported being prescribed Haldol and Lithium.     Patient reported living alone. Patient denied access to guns. Patient was very anxious and cooperative during assessment. Patient unable to contract for safety.  How Long Has This Been Causing You Problems? 1 wk - 1 month  Have You Recently Had Any Thoughts About Hurting Yourself? Yes  How long ago did you have thoughts about hurting yourself? currently  Are You Planning to [place] At This time? Yes  Have you Recently Had Thoughts About Hurting Someone [name]? Yes  How long ago  did you have thoughts of harming others? today  Are You Planning To Harm Someone At This Time? Yes  Explanation: "I have been arguing with my boyfriend"  Are you currently experiencing any auditory, visual or other hallucinations? No  Have You Used Any Alcohol or Drugs in the Past 24 Hours? No  Clinician description of patient physical appearance/behavior: casual and cooperative  What Do You Feel Would Help You the Most Today? Treatment for Depression or other mood problem  If access to [facility] was not available, would you have sought care in the Emergency Department? Yes  Determination of Need Emergent (2 hours)  Options For Referral Medication Management;Inpatient Hospitalization;Outpatient Therapy
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
## 2021-03-23 ENCOUNTER — Other Ambulatory Visit: Payer: Self-pay

## 2021-03-23 ENCOUNTER — Inpatient Hospital Stay (HOSPITAL_COMMUNITY): Payer: Medicaid Other

## 2021-03-23 ENCOUNTER — Inpatient Hospital Stay (HOSPITAL_COMMUNITY)
Admission: EM | Admit: 2021-03-23 | Discharge: 2021-03-28 | DRG: 917 | Disposition: A | Discharge status: Other Acute Inpatient Hospital | Payer: Medicaid Other | Attending: Internal Medicine | Admitting: Internal Medicine

## 2021-03-23 ENCOUNTER — Encounter (HOSPITAL_COMMUNITY): Payer: Self-pay

## 2021-03-23 DIAGNOSIS — R6510 Systemic inflammatory response syndrome (SIRS) of non-infectious origin without acute organ dysfunction: Secondary | ICD-10-CM | POA: Diagnosis present

## 2021-03-23 DIAGNOSIS — T434X2A Poisoning by butyrophenone and thiothixene neuroleptics, intentional self-harm, initial encounter: Secondary | ICD-10-CM | POA: Diagnosis present

## 2021-03-23 DIAGNOSIS — G928 Other toxic encephalopathy: Secondary | ICD-10-CM | POA: Diagnosis present

## 2021-03-23 DIAGNOSIS — Z880 Allergy status to penicillin: Secondary | ICD-10-CM

## 2021-03-23 DIAGNOSIS — I10 Essential (primary) hypertension: Secondary | ICD-10-CM | POA: Diagnosis present

## 2021-03-23 DIAGNOSIS — Z79899 Other long term (current) drug therapy: Secondary | ICD-10-CM | POA: Diagnosis not present

## 2021-03-23 DIAGNOSIS — F4310 Post-traumatic stress disorder, unspecified: Secondary | ICD-10-CM | POA: Diagnosis present

## 2021-03-23 DIAGNOSIS — E1165 Type 2 diabetes mellitus with hyperglycemia: Secondary | ICD-10-CM | POA: Diagnosis present

## 2021-03-23 DIAGNOSIS — E785 Hyperlipidemia, unspecified: Secondary | ICD-10-CM | POA: Diagnosis present

## 2021-03-23 DIAGNOSIS — Z01818 Encounter for other preprocedural examination: Secondary | ICD-10-CM

## 2021-03-23 DIAGNOSIS — F259 Schizoaffective disorder, unspecified: Secondary | ICD-10-CM | POA: Diagnosis present

## 2021-03-23 DIAGNOSIS — F319 Bipolar disorder, unspecified: Secondary | ICD-10-CM | POA: Diagnosis present

## 2021-03-23 DIAGNOSIS — F429 Obsessive-compulsive disorder, unspecified: Secondary | ICD-10-CM | POA: Diagnosis present

## 2021-03-23 DIAGNOSIS — Z8249 Family history of ischemic heart disease and other diseases of the circulatory system: Secondary | ICD-10-CM

## 2021-03-23 DIAGNOSIS — Z794 Long term (current) use of insulin: Secondary | ICD-10-CM

## 2021-03-23 DIAGNOSIS — Z91041 Radiographic dye allergy status: Secondary | ICD-10-CM

## 2021-03-23 DIAGNOSIS — R9431 Abnormal electrocardiogram [ECG] [EKG]: Secondary | ICD-10-CM | POA: Diagnosis present

## 2021-03-23 DIAGNOSIS — Z888 Allergy status to other drugs, medicaments and biological substances status: Secondary | ICD-10-CM

## 2021-03-23 DIAGNOSIS — F17210 Nicotine dependence, cigarettes, uncomplicated: Secondary | ICD-10-CM | POA: Diagnosis present

## 2021-03-23 DIAGNOSIS — Z9151 Personal history of suicidal behavior: Secondary | ICD-10-CM

## 2021-03-23 DIAGNOSIS — T50902A Poisoning by unspecified drugs, medicaments and biological substances, intentional self-harm, initial encounter: Secondary | ICD-10-CM | POA: Diagnosis not present

## 2021-03-23 DIAGNOSIS — Z20822 Contact with and (suspected) exposure to covid-19: Secondary | ICD-10-CM | POA: Diagnosis present

## 2021-03-23 DIAGNOSIS — F603 Borderline personality disorder: Secondary | ICD-10-CM | POA: Diagnosis present

## 2021-03-23 DIAGNOSIS — F17290 Nicotine dependence, other tobacco product, uncomplicated: Secondary | ICD-10-CM | POA: Diagnosis present

## 2021-03-23 DIAGNOSIS — R45850 Homicidal ideations: Secondary | ICD-10-CM | POA: Diagnosis present

## 2021-03-23 DIAGNOSIS — E871 Hypo-osmolality and hyponatremia: Secondary | ICD-10-CM | POA: Diagnosis present

## 2021-03-23 DIAGNOSIS — D649 Anemia, unspecified: Secondary | ICD-10-CM | POA: Diagnosis present

## 2021-03-23 DIAGNOSIS — E669 Obesity, unspecified: Secondary | ICD-10-CM | POA: Diagnosis present

## 2021-03-23 DIAGNOSIS — J9600 Acute respiratory failure, unspecified whether with hypoxia or hypercapnia: Secondary | ICD-10-CM | POA: Diagnosis present

## 2021-03-23 DIAGNOSIS — Z6841 Body Mass Index (BMI) 40.0 and over, adult: Secondary | ICD-10-CM

## 2021-03-23 DIAGNOSIS — T50902S Poisoning by unspecified drugs, medicaments and biological substances, intentional self-harm, sequela: Secondary | ICD-10-CM | POA: Diagnosis not present

## 2021-03-23 LAB — URINALYSIS, ROUTINE W REFLEX MICROSCOPIC
Bacteria, UA: NONE SEEN
Bilirubin Urine: NEGATIVE
Glucose, UA: >=500 mg/dL — AB
Hgb urine dipstick: NEGATIVE
Ketones, ur: 5 mg/dL — AB
Leukocytes,Ua: NEGATIVE
Nitrite: NEGATIVE
Protein, ur: NEGATIVE mg/dL
Specific Gravity, Urine: 1.035 — ABNORMAL HIGH (ref 1.005–1.030)
pH: 5 (ref 5.0–8.0)

## 2021-03-23 LAB — I-STAT ARTERIAL BLOOD GAS, ED
Acid-base deficit: 2 mmol/L (ref 0.0–2.0)
Bicarbonate: 24.6 mmol/L (ref 20.0–28.0)
Calcium, Ion: 1.3 mmol/L (ref 1.15–1.40)
HCT: 37 % (ref 36.0–46.0)
Hemoglobin: 12.6 g/dL (ref 12.0–15.0)
O2 Saturation: 96 %
Patient temperature: 98.4
Potassium: 2.7 mmol/L — CL (ref 3.5–5.1)
Sodium: 141 mmol/L (ref 135–145)
TCO2: 26 mmol/L (ref 22–32)
pCO2 arterial: 47.8 mmHg (ref 32.0–48.0)
pH, Arterial: 7.319 — ABNORMAL LOW (ref 7.350–7.450)
pO2, Arterial: 87 mmHg (ref 83.0–108.0)

## 2021-03-23 LAB — RESP PANEL BY RT-PCR (FLU A&B, COVID) ARPGX2
Influenza A by PCR: NEGATIVE
Influenza B by PCR: NEGATIVE
SARS Coronavirus 2 by RT PCR: NEGATIVE

## 2021-03-23 LAB — GLUCOSE, CAPILLARY
Glucose-Capillary: 273 mg/dL — ABNORMAL HIGH (ref 70–99)
Glucose-Capillary: 274 mg/dL — ABNORMAL HIGH (ref 70–99)
Glucose-Capillary: 321 mg/dL — ABNORMAL HIGH (ref 70–99)
Glucose-Capillary: 352 mg/dL — ABNORMAL HIGH (ref 70–99)
Glucose-Capillary: 371 mg/dL — ABNORMAL HIGH (ref 70–99)

## 2021-03-23 LAB — I-STAT BETA HCG BLOOD, ED (MC, WL, AP ONLY): I-stat hCG, quantitative: <5 m[IU]/mL (ref <5)

## 2021-03-23 LAB — I-STAT CHEM 8, ED
BUN: 5 mg/dL — ABNORMAL LOW (ref 6–20)
Calcium, Ion: 1 mmol/L — ABNORMAL LOW (ref 1.15–1.40)
Chloride: 106 mmol/L (ref 98–111)
Creatinine, Ser: 0.6 mg/dL (ref 0.44–1.00)
Glucose, Bld: 275 mg/dL — ABNORMAL HIGH (ref 70–99)
HCT: 38 % (ref 36.0–46.0)
Hemoglobin: 12.9 g/dL (ref 12.0–15.0)
Potassium: 3.8 mmol/L (ref 3.5–5.1)
Sodium: 138 mmol/L (ref 135–145)
TCO2: 24 mmol/L (ref 22–32)

## 2021-03-23 LAB — CBC
HCT: 36.6 % (ref 36.0–46.0)
Hemoglobin: 11.4 g/dL — ABNORMAL LOW (ref 12.0–15.0)
MCH: 25.9 pg — ABNORMAL LOW (ref 26.0–34.0)
MCHC: 31.1 g/dL (ref 30.0–36.0)
MCV: 83.2 fL (ref 80.0–100.0)
Platelets: 216 10*3/uL (ref 150–400)
RBC: 4.4 MIL/uL (ref 3.87–5.11)
RDW: 15.6 % — ABNORMAL HIGH (ref 11.5–15.5)
WBC: 9.6 10*3/uL (ref 4.0–10.5)
nRBC: 0 % (ref 0.0–0.2)

## 2021-03-23 LAB — SALICYLATE LEVEL: Salicylate Lvl: <7 mg/dL — ABNORMAL LOW (ref 7.0–30.0)

## 2021-03-23 LAB — VALPROIC ACID LEVEL: Valproic Acid Lvl: <10 ug/mL — ABNORMAL LOW (ref 50.0–100.0)

## 2021-03-23 LAB — COMPREHENSIVE METABOLIC PANEL
ALT: 21 U/L (ref 0–44)
AST: 26 U/L (ref 15–41)
Albumin: 3.7 g/dL (ref 3.5–5.0)
Alkaline Phosphatase: 63 U/L (ref 38–126)
Anion gap: 9 (ref 5–15)
BUN: 5 mg/dL — ABNORMAL LOW (ref 6–20)
CO2: 23 mmol/L (ref 22–32)
Calcium: 8.9 mg/dL (ref 8.9–10.3)
Chloride: 105 mmol/L (ref 98–111)
Creatinine, Ser: 0.75 mg/dL (ref 0.44–1.00)
GFR, Estimated: >60 mL/min (ref >60)
Glucose, Bld: 258 mg/dL — ABNORMAL HIGH (ref 70–99)
Potassium: 3.5 mmol/L (ref 3.5–5.1)
Sodium: 137 mmol/L (ref 135–145)
Total Bilirubin: 0.6 mg/dL (ref 0.3–1.2)
Total Protein: 6.7 g/dL (ref 6.5–8.1)

## 2021-03-23 LAB — TSH: TSH: 2.087 u[IU]/mL (ref 0.350–4.500)

## 2021-03-23 LAB — BASIC METABOLIC PANEL
Anion gap: 10 (ref 5–15)
BUN: <5 mg/dL — ABNORMAL LOW (ref 6–20)
CO2: 24 mmol/L (ref 22–32)
Calcium: 9.1 mg/dL (ref 8.9–10.3)
Chloride: 103 mmol/L (ref 98–111)
Creatinine, Ser: 0.82 mg/dL (ref 0.44–1.00)
GFR, Estimated: >60 mL/min (ref >60)
Glucose, Bld: 314 mg/dL — ABNORMAL HIGH (ref 70–99)
Potassium: 4.1 mmol/L (ref 3.5–5.1)
Sodium: 137 mmol/L (ref 135–145)

## 2021-03-23 LAB — RAPID URINE DRUG SCREEN, HOSP PERFORMED
Amphetamines: NOT DETECTED
Barbiturates: POSITIVE — AB
Benzodiazepines: POSITIVE — AB
Cocaine: NOT DETECTED
Opiates: NOT DETECTED
Tetrahydrocannabinol: NOT DETECTED

## 2021-03-23 LAB — MAGNESIUM
Magnesium: 1.8 mg/dL (ref 1.7–2.4)
Magnesium: 2.2 mg/dL (ref 1.7–2.4)

## 2021-03-23 LAB — ETHANOL: Alcohol, Ethyl (B): <10 mg/dL (ref <10)

## 2021-03-23 LAB — MRSA NEXT GEN BY PCR, NASAL: MRSA by PCR Next Gen: NOT DETECTED

## 2021-03-23 LAB — ACETAMINOPHEN LEVEL: Acetaminophen (Tylenol), Serum: <10 ug/mL — ABNORMAL LOW (ref 10–30)

## 2021-03-23 LAB — LITHIUM LEVEL: Lithium Lvl: 0.3 mmol/L — ABNORMAL LOW (ref 0.60–1.20)

## 2021-03-23 MED ORDER — INSULIN DETEMIR 100 UNIT/ML ~~LOC~~ SOLN
15.0000 [IU] | Freq: Two times a day (BID) | SUBCUTANEOUS | Status: DC
  Administered 2021-03-23: 22:00:00 15 [IU] via SUBCUTANEOUS
  Filled 2021-03-23 (×3): qty 0.15

## 2021-03-23 MED ORDER — ROCURONIUM BROMIDE 10 MG/ML (PF) SYRINGE
PREFILLED_SYRINGE | INTRAVENOUS | Status: AC
  Filled 2021-03-23: qty 10

## 2021-03-23 MED ORDER — ORAL CARE MOUTH RINSE
15.0000 mL | OROMUCOSAL | Status: DC
  Administered 2021-03-23 – 2021-03-25 (×20): 15 mL via OROMUCOSAL

## 2021-03-23 MED ORDER — LACTATED RINGERS IV SOLN: INTRAVENOUS | Status: DC

## 2021-03-23 MED ORDER — LACTATED RINGERS IV BOLUS: 1000.0000 mL | Freq: Once | INTRAVENOUS | Status: DC

## 2021-03-23 MED ORDER — FENTANYL CITRATE (PF) 100 MCG/2ML IJ SOLN
50.0000 ug | INTRAMUSCULAR | Status: DC | PRN
Start: 2021-03-23 — End: 2021-03-25
  Administered 2021-03-23 – 2021-03-24 (×5): 100 ug via INTRAVENOUS
  Administered 2021-03-25: 200 ug via INTRAVENOUS
  Filled 2021-03-23 (×4): qty 2
  Filled 2021-03-23: qty 4
  Filled 2021-03-23: qty 2

## 2021-03-23 MED ORDER — MIDAZOLAM HCL 2 MG/2ML IJ SOLN
INTRAMUSCULAR | Status: AC
  Filled 2021-03-23: qty 4

## 2021-03-23 MED ORDER — SODIUM CHLORIDE 0.9 % IV SOLN: INTRAVENOUS | Status: DC

## 2021-03-23 MED ORDER — HEPARIN SODIUM (PORCINE) 5000 UNIT/ML IJ SOLN
5000.0000 [IU] | Freq: Three times a day (TID) | INTRAMUSCULAR | Status: DC
  Administered 2021-03-23 – 2021-03-28 (×12): 5000 [IU] via SUBCUTANEOUS
  Filled 2021-03-23 (×13): qty 1

## 2021-03-23 MED ORDER — ROCURONIUM BROMIDE 50 MG/5ML IV SOLN
INTRAVENOUS | Status: AC | PRN
  Administered 2021-03-23: 100 mg via INTRAVENOUS

## 2021-03-23 MED ORDER — FENTANYL CITRATE PF 50 MCG/ML IJ SOSY
50.0000 ug | PREFILLED_SYRINGE | INTRAMUSCULAR | Status: DC | PRN
  Administered 2021-03-23: 16:00:00 50 ug via INTRAVENOUS

## 2021-03-23 MED ORDER — MIDAZOLAM HCL 2 MG/2ML IJ SOLN
INTRAMUSCULAR | Status: AC | PRN
  Administered 2021-03-23: 2 mg via INTRAVENOUS

## 2021-03-23 MED ORDER — FENTANYL CITRATE PF 50 MCG/ML IJ SOSY: 50.0000 ug | PREFILLED_SYRINGE | INTRAMUSCULAR | Status: DC | PRN

## 2021-03-23 MED ORDER — PANTOPRAZOLE SODIUM 40 MG IV SOLR: 40.0000 mg | Freq: Every day | INTRAVENOUS | Status: DC

## 2021-03-23 MED ORDER — FENTANYL CITRATE PF 50 MCG/ML IJ SOSY
PREFILLED_SYRINGE | INTRAMUSCULAR | Status: AC
  Filled 2021-03-23: qty 2

## 2021-03-23 MED ORDER — MAGNESIUM SULFATE 2 GM/50ML IV SOLN
2.0000 g | Freq: Once | INTRAVENOUS | Status: AC
  Administered 2021-03-23: 17:00:00 2 g via INTRAVENOUS
  Filled 2021-03-23: qty 50

## 2021-03-23 MED ORDER — INSULIN ASPART 100 UNIT/ML IJ SOLN
0.0000 [IU] | INTRAMUSCULAR | Status: DC
  Administered 2021-03-23: 8 [IU] via SUBCUTANEOUS
  Administered 2021-03-23: 20:00:00 15 [IU] via SUBCUTANEOUS
  Administered 2021-03-23: 17:00:00 8 [IU] via SUBCUTANEOUS
  Administered 2021-03-24: 13:00:00 5 [IU] via SUBCUTANEOUS
  Administered 2021-03-24 (×3): 8 [IU] via SUBCUTANEOUS
  Administered 2021-03-24: 09:00:00 3 [IU] via SUBCUTANEOUS
  Administered 2021-03-25: 11 [IU] via SUBCUTANEOUS
  Administered 2021-03-25: 8 [IU] via SUBCUTANEOUS
  Administered 2021-03-25: 3 [IU] via SUBCUTANEOUS
  Administered 2021-03-25: 11 [IU] via SUBCUTANEOUS
  Administered 2021-03-25: 5 [IU] via SUBCUTANEOUS
  Administered 2021-03-25: 8 [IU] via SUBCUTANEOUS
  Administered 2021-03-26 (×3): 3 [IU] via SUBCUTANEOUS

## 2021-03-23 MED ORDER — CALCIUM GLUCONATE-NACL 1-0.675 GM/50ML-% IV SOLN
1.0000 g | Freq: Once | INTRAVENOUS | Status: AC
  Administered 2021-03-23: 15:00:00 1000 mg via INTRAVENOUS

## 2021-03-23 MED ORDER — ETOMIDATE 2 MG/ML IV SOLN
INTRAVENOUS | Status: AC | PRN
  Administered 2021-03-23: 20 mg via INTRAVENOUS

## 2021-03-23 MED ORDER — CHLORHEXIDINE GLUCONATE 0.12% ORAL RINSE (MEDLINE KIT)
15.0000 mL | Freq: Two times a day (BID) | OROMUCOSAL | Status: DC
  Administered 2021-03-23 – 2021-03-25 (×5): 15 mL via OROMUCOSAL

## 2021-03-23 MED ORDER — PROPOFOL 1000 MG/100ML IV EMUL
0.0000 ug/kg/min | INTRAVENOUS | Status: DC
  Administered 2021-03-23: 22:00:00 35 ug/kg/min via INTRAVENOUS
  Administered 2021-03-23: 15:00:00 30 ug/kg/min via INTRAVENOUS
  Administered 2021-03-23: 19:00:00 50 ug/kg/min via INTRAVENOUS
  Administered 2021-03-24: 02:00:00 40 ug/kg/min via INTRAVENOUS
  Administered 2021-03-24: 05:00:00 35 ug/kg/min via INTRAVENOUS
  Filled 2021-03-23 (×4): qty 100

## 2021-03-23 MED ORDER — CALCIUM GLUCONATE-NACL 1-0.675 GM/50ML-% IV SOLN
1.0000 g | Freq: Once | INTRAVENOUS | Status: AC
  Administered 2021-03-23: 16:00:00 1000 mg via INTRAVENOUS

## 2021-03-23 MED ORDER — ETOMIDATE 2 MG/ML IV SOLN
INTRAVENOUS | Status: AC
  Filled 2021-03-23: qty 10

## 2021-03-23 MED ORDER — FENTANYL CITRATE (PF) 100 MCG/2ML IJ SOLN
INTRAMUSCULAR | Status: AC | PRN
  Administered 2021-03-23: 50 ug via INTRAVENOUS

## 2021-03-23 MED ORDER — MAGNESIUM SULFATE 2 GM/50ML IV SOLN
2.0000 g | Freq: Once | INTRAVENOUS | Status: AC
  Administered 2021-03-23: 15:00:00 2 g via INTRAVENOUS
  Filled 2021-03-23: qty 50

## 2021-03-23 MED ORDER — POLYETHYLENE GLYCOL 3350 17 G PO PACK
17.0000 g | PACK | Freq: Every day | ORAL | Status: DC
  Administered 2021-03-24: 09:00:00 17 g
  Filled 2021-03-23: qty 1

## 2021-03-23 MED ORDER — CHARCOAL ACTIVATED PO LIQD
50.0000 g | Freq: Once | ORAL | Status: AC
  Administered 2021-03-23: 16:00:00 50 g

## 2021-03-23 MED ORDER — LACTATED RINGERS IV BOLUS: 20.0000 mL/kg | Freq: Once | INTRAVENOUS | Status: DC

## 2021-03-23 MED ORDER — DOCUSATE SODIUM 50 MG/5ML PO LIQD
100.0000 mg | Freq: Two times a day (BID) | ORAL | Status: DC
  Administered 2021-03-23 – 2021-03-24 (×3): 100 mg
  Filled 2021-03-23 (×4): qty 10

## 2021-03-23 MED ORDER — CALCIUM GLUCONATE-NACL 1-0.675 GM/50ML-% IV SOLN
1.0000 g | Freq: Once | INTRAVENOUS | Status: AC
  Administered 2021-03-23: 17:00:00 1000 mg via INTRAVENOUS

## 2021-03-23 NOTE — ED Notes (Signed)
Pt requesting to speak to someone regarding bed placement and wanting to know if her ACT team has been contacted. Informed provider and provider has spoken to PT x2.

## 2021-03-23 NOTE — ED Notes (Signed)
Discharge instructions provided and Pt stated understanding. Pt alert, orient and ambulatory prior to d/c from facility. Personal belongings returned from locker number 22. Cab called and can voucher provided. Pt threw AVS in the trash prior to d/c from facility. Safety maintained.

## 2021-03-23 NOTE — Progress Notes (Signed)
[facility] Progress Note Patient Name: [name] DOB: 04/28/1990 MRN: [id number]   Date of Service  03/23/2021  HPI/Events of Note  hyperglycemic  eICU Interventions  Levemir added     Intervention Category Intermediate Interventions: Hyperglycemia - evaluation and treatment  Tilden Dome 03/23/2021, 8:23 PM

## 2021-03-23 NOTE — ED Provider Notes (Signed)
FBC/OBS ASAP Discharge Summary  Date and Time: 03/23/2021 3:20 PM  Name: [name]  MRN:  [id number]   Discharge Diagnoses:  Final diagnoses:  Suicidal ideation  Homicidal ideation  Severe episode of recurrent major depressive disorder, without psychotic features ([place])    Subjective:  [name] reported " I don't want to go back to my apartment because they are supposed to get me a new one."   Stay Summary: [name] 30 year old African-American female is well-known to this service.  Patient expressed concerns about returning back to her apartment as she states " they are trying to find me a new apartment."  Patient reports concerns with her medications and is requesting another medication adjustment.  She was recently discharged from [facility].  Patient reports concerns with medications not being effective.  Patient has wraparound services with [facility] ([facility])  NP spoke to [facility] team weekend provider Varney Biles [phone] who reported she will follow-up with patient within 24 to 48 hours after discharging from hospitalization.   States patient has a follow-up appointment with medical provider MD Jake Samples medication management. Per Varney Biles patient stated "feeling lonely when she is home alone."   During this admission it was reported that patient attempted to coax her significant other on what to say in order to be admitted to the same facility on 03/22/2021.    NP discussed following up with act services.  Patient was reluctant to plan.  Patient to [facility] with [facility]  Support, encouragement and reassurance was provided.   Total Time spent with patient: 15 minutes  Past Psychiatric History:  Past Medical History:  Past Medical History:  Diagnosis Date   Anxiety    Bipolar 1 disorder ([place])    Cognitive deficits    Depression    Diabetes mellitus without complication ([place])    Hypertension    Mental disorder    Mental  health disorder    Obesity     Past Surgical History:  Procedure Laterality Date   CESAREAN SECTION     CESAREAN SECTION N/A 04/25/2013   Procedure: REPEAT CESAREAN SECTION;  Surgeon: [name], MD;  Location: [facility];  Service: Obstetrics;  Laterality: N/A;   MASS EXCISION N/A 06/03/2012   Procedure: EXCISION MASS;  Surgeon: [name], MD;  Location: [facility];  Service: ENT;  Laterality: N/A;  Excision uvula mass   TONSILLECTOMY N/A 06/03/2012   Procedure: TONSILLECTOMY;  Surgeon: [name], MD;  Location: [facility];  Service: ENT;  Laterality: N/A;   TONSILLECTOMY     Family History:  Family History  Problem Relation Age of Onset   Hypertension Mother    Diabetes Father    Family Psychiatric History:  Social History:  Social History   Substance and Sexual Activity  Alcohol Use Not Currently     Social History   Substance and Sexual Activity  Drug Use Not Currently   Types: "Crack" cocaine, Other-see comments   Comment: Patient reports hx of smoking Crack    Social History   Socioeconomic History   Marital status: Single    Spouse name: Not on file   Number of children: Not on file   Years of education: Not on file   Highest education level: Not on file  Occupational History   Not on file  Tobacco Use   Smoking status: Every Day    Types: Cigars   Smokeless tobacco: Never   Tobacco comments:  Pt declined  Vaping Use   Vaping Use: Never used  Substance and Sexual Activity   Alcohol use: Not Currently   Drug use: Not Currently    Types: "Crack" cocaine, Other-see comments    Comment: Patient reports hx of smoking Crack   Sexual activity: Not Currently    Birth control/protection: None    Comment: occasionally  Other Topics Concern   Not on file  Social History Narrative   ** Merged History Encounter **       Social Determinants of Health   Financial Resource Strain: Not on file  Food Insecurity: Not on  file  Transportation Needs: Not on file  Physical Activity: Not on file  Stress: Not on file  Social Connections: Not on file   SDOH:  SDOH Screenings   Alcohol Screen: Low Risk    Last Alcohol Screening Score (AUDIT): 3  Depression (PHQ2-9): Low Risk    PHQ-2 Score: 4  Financial Resource Strain: Not on file  Food Insecurity: Not on file  Housing: Not on file  Physical Activity: Not on file  Social Connections: Not on file  Stress: Not on file  Tobacco Use: High Risk   Smoking Tobacco Use: Every Day   Smokeless Tobacco Use: Never   Passive Exposure: Not on file  Transportation Needs: Not on file    Tobacco Cessation:  N/A, patient does not currently use tobacco products  Current Medications:  No current facility-administered medications for this encounter.   Current Outpatient Medications  Medication Sig Dispense Refill   albuterol (VENTOLIN HFA) 108 (90 Base) MCG/ACT inhaler Inhale 1-2 puffs into the lungs every 6 (six) hours as needed for wheezing or shortness of breath. 18 g 0   amLODipine (NORVASC) 5 MG tablet Take 5 mg by mouth daily.     cholecalciferol (VITAMIN D) 25 MCG (1000 UNIT) tablet Take 1,000 Units by mouth daily.     fluticasone (FLONASE) 50 MCG/ACT nasal spray Place 2 sprays into both nostrils daily. (Patient taking differently: Place 2 sprays into both nostrils daily as needed for allergies.) 18.2 mL 0   glimepiride (AMARYL) 4 MG tablet Take 4 mg by mouth daily.     haloperidol (HALDOL) 10 MG tablet Take 10-20 mg by mouth See admin instructions. 10 mg in the morning  20 mg at bedtime     haloperidol decanoate (HALDOL DECANOATE) 100 MG/ML injection Inject 150 mg into the muscle every 28 (twenty-eight) days. Inject 163m (1.522m every 28 days.     insulin detemir (LEVEMIR) 100 UNIT/ML injection Inject 0.4 mLs (40 Units total) into the skin 2 (two) times daily. 10 mL 11   lithium carbonate (ESKALITH) 450 MG CR tablet Take 450 mg by mouth daily.      metroNIDAZOLE (FLAGYL) 500 MG tablet Take 1 tablet (500 mg total) by mouth 2 (two) times daily. One po bid x 7 days 14 tablet 0   omeprazole (PRILOSEC) 20 MG capsule Take 20 mg by mouth daily.     simvastatin (ZOCOR) 20 MG tablet Take 20 mg by mouth daily.     vitamin B-12 (CYANOCOBALAMIN) 1000 MCG tablet Take 1,000 mcg by mouth daily.     Facility-Administered Medications Ordered in Other Encounters  Medication Dose Route Frequency Provider Last Rate Last Admin   calcium gluconate 1 g/ 50 mL sodium chloride IVPB  1 g Intravenous Once LaRegan LemmingMD 50 mL/hr at 03/23/21 1448 1,000 mg at 03/23/21 1448   And   calcium gluconate 1  g/ 50 mL sodium chloride IVPB  1 g Intravenous Once Regan Lemming, MD       And   calcium gluconate 1 g/ 50 mL sodium chloride IVPB  1 g Intravenous Once Regan Lemming, MD       charcoal activated (NO SORBITOL) (ACTIDOSE-AQUA) suspension 50 g  50 g Per Tube Once Jennelle Human B, NP       chlorhexidine gluconate (MEDLINE KIT) (PERIDEX) 0.12 % solution 15 mL  15 mL Mouth Rinse BID Jennelle Human B, NP       docusate (COLACE) 50 MG/5ML liquid 100 mg  100 mg Per Tube BID Jennelle Human B, NP       etomidate (AMIDATE) 2 MG/ML injection            etomidate (AMIDATE) injection   Intravenous PRN Juanito Doom, MD   20 mg at 03/23/21 1508   fentaNYL (SUBLIMAZE) 50 MCG/ML injection            fentaNYL (SUBLIMAZE) injection 50 mcg  50 mcg Intravenous Q15 min PRN Jennelle Human B, NP       fentaNYL (SUBLIMAZE) injection 50-200 mcg  50-200 mcg Intravenous Q30 min PRN Jennelle Human B, NP       fentaNYL (SUBLIMAZE) injection   Intravenous PRN Juanito Doom, MD   50 mcg at 03/23/21 1508   lactated ringers bolus 2,358 mL  20 mL/kg Intravenous Once Regan Lemming, MD       lactated ringers infusion   Intravenous Continuous Regan Lemming, MD       magnesium sulfate IVPB 2 g 50 mL  2 g Intravenous Once Regan Lemming, MD 50 mL/hr at 03/23/21 1442 2 g at 03/23/21 1442    MEDLINE mouth rinse  15 mL Mouth Rinse 10 times per day Jennelle Human B, NP       midazolam (VERSED) 2 MG/2ML injection            midazolam (VERSED) injection   Intravenous PRN Juanito Doom, MD   2 mg at 03/23/21 1508   pantoprazole (PROTONIX) injection 40 mg  40 mg Intravenous Daily Jennelle Human B, NP       polyethylene glycol (MIRALAX / GLYCOLAX) packet 17 g  17 g Per Tube Daily Jennelle Human B, NP       propofol (DIPRIVAN) 1000 MG/100ML infusion  0-50 mcg/kg/min Intravenous Continuous Jennelle Human B, NP       rocuronium (ZEMURON) injection   Intravenous PRN Juanito Doom, MD   100 mg at 03/23/21 1508   rocuronium bromide 100 MG/10ML SOSY             PTA Medications: (Not in a hospital admission)   Musculoskeletal  Strength & Muscle Tone: within normal limits Gait & Station: normal Patient leans: N/A  Psychiatric Specialty Exam  Presentation  General Appearance: Appropriate for Environment; Casual  Eye Contact:Fair  Speech:Clear and Coherent; Normal Rate  Speech Volume:Normal  Handedness:Right   Mood and Affect  Mood:Depressed; Anxious  Affect:Congruent; Depressed   Thought Process  Thought Processes:Coherent  Descriptions of Associations:Intact  Orientation:Full (Time, Place and Person)  Thought Content:Illogical; Delusions  Diagnosis of Schizophrenia or Schizoaffective disorder in past: Yes  Duration of Psychotic Symptoms: Greater than six months   Hallucinations:Hallucinations: Visual Description of Visual Hallucinations: Patient reports that she is seeing her enemies  Ideas of Reference:None  Suicidal Thoughts:Suicidal Thoughts: Yes, Active SI Active Intent and/or Plan: With Intent; With Plan  Homicidal Thoughts:Homicidal Thoughts: Yes,  Passive HI Passive Intent and/or Plan: Without Intent; Without Plan   Sensorium  Memory:Immediate Good; Recent Fair; Remote Fair  Judgment:Impaired  Insight:Lacking   Executive Functions   Concentration:Good  Attention Span:Good  [place]  Language:Good   Psychomotor Activity  Psychomotor Activity:Psychomotor Activity: Normal   Assets  Assets:Communication Skills; Desire for Improvement; Housing   Sleep  Sleep:Sleep: Fair   Nutritional Assessment (For OBS and FBC admissions only) Has the patient had a weight loss or gain of 10 pounds or more in the last 3 months?: No Has the patient had a decrease in food intake/or appetite?: No Does the patient have dental problems?: No Does the patient have eating habits or behaviors that may be indicators of an eating disorder including binging or inducing vomiting?: No Has the patient recently lost weight without trying?: 0 Has the patient been eating poorly because of a decreased appetite?: 0 Malnutrition Screening Tool Score: 0   Physical Exam  Physical Exam Vitals and nursing note reviewed.  HENT:     Head: Normocephalic.  Cardiovascular:     Rate and Rhythm: Normal rate and regular rhythm.  Skin:    General: Skin is warm and dry.  Neurological:     Mental Status: She is alert and oriented to person, place, and time.  Psychiatric:        Attention and Perception: Attention and perception normal.        Mood and Affect: Mood normal.        Speech: Speech normal.        Behavior: Behavior normal. Behavior is cooperative.        Thought Content: Thought content normal.        Cognition and Memory: Cognition normal.        Judgment: Judgment normal.   Review of Systems  Psychiatric/Behavioral:  Positive for depression. The patient is nervous/anxious.   All other systems reviewed and are negative. Blood pressure 132/87, pulse 90, temperature 98.3 F (36.8 C), temperature source Oral, resp. rate 14, SpO2 96 %. There is no height or weight on file to calculate BMI.  Demographic Factors:  Living alone and Unemployed  Loss Factors: Financial problems/change in socioeconomic  status  Historical Factors: Prior suicide attempts and Impulsivity  Risk Reduction Factors:   Positive social support and Positive therapeutic relationship  Continued Clinical Symptoms:  Severe Anxiety and/or Agitation Depression:   Hopelessness Impulsivity  Cognitive Features That Contribute To Risk:  Thought constriction (tunnel vision)    Suicide Risk:  Minimal: No identifiable suicidal ideation.  Patients presenting with no risk factors but with morbid ruminations; may be classified as minimal risk based on the severity of the depressive symptoms  Plan Of Care/Follow-up recommendations:  Activity:  as tolerated Diet:  heart healthy   Disposition: Take all medications as prescribed. Keep all follow-up appointments as scheduled.  Do not consume alcohol or use illegal drugs while on prescription medications. Report any adverse effects from your medications to your primary care provider promptly.  In the event of recurrent symptoms or worsening symptoms, call 911, a crisis hotline, or go to the nearest emergency department for evaluation.    Derrill Center, NP 03/23/2021, 3:20 PM

## 2021-03-23 NOTE — Discharge Instructions (Addendum)
Take all medications as prescribed. Keep all follow-up appointments as scheduled.  Do not consume alcohol or use illegal drugs while on prescription medications. Report any adverse effects from your medications to your primary care provider promptly.  In the event of recurrent symptoms or worsening symptoms, call 911, a crisis hotline, or go to the nearest emergency department for evaluation.   

## 2021-03-23 NOTE — ED Notes (Signed)
ETCO2 applied to pt.

## 2021-03-23 NOTE — Progress Notes (Signed)
Pt is currently in ICU, intubated, sedated, with bilateral mitts and bilateral restraints in place. No sitter needed or ordered at this time

## 2021-03-23 NOTE — ED Provider Notes (Signed)
[facility] EMERGENCY DEPARTMENT Provider Note   CSN: [id number] Arrival date & time: 03/23/21  1339     History Chief Complaint  Patient presents with   Overdose    [name] is a 30 y.o. female.  HPI  30 year old female with a medical history significant for bipolar disorder, DM 2, depression, obesity, prior suicide attempts who presents to the emergency department with concern for an intentional overdose of her home medications.  The history is provided by EMS as the patient was somnolent and unable provide a medical history.  Per EMS, the patient took multiple home medication packets from her pharmacy roughly 10 minutes prior to EMS arrival approximately at 12:55 PM.  The patient told EMS that she wanted to die.  With EMS, CBG was notably 299, her vitals were significant for heart rate 100, respiratory 18, saturating 98% on room air, BP 150/90.  List of medications ingested: Omezprazole 72m x7, Vit D 10029mx7, Prazosin 52m252m7, Simvastatin 55m38m Lithium 450mg38m Amlodipine 52mg x64mHydroxizine 50 mg x7, Benztropine 1mg x747mrimidone 50mg x140mivalproex 500mg x1182mldol 10mg x13.66mvel 5 caveat due to patient mental status.  Past Medical History:  Diagnosis Date   Anxiety    Bipolar 1 disorder (HCC)    CoTangerinetive deficits    Depression    Diabetes mellitus without complication (HCC)    HyFontanatension    Mental disorder    Mental health disorder    Obesity     Patient Active Problem List   Diagnosis Date Noted   Suicidal overdose (HCC) 11/27[place]22   Syphilis 07/15/2020   Malingering 06/05/2020   Gastroesophageal reflux disease 05/04/2020   Hyperglycemia due to type 2 diabetes mellitus (HCC) 01/08[place]22   Long term (current) use of insulin (HCC) 01/08[place]22   Migraine without aura 05/04/2020   Morbid obesity (HCC) 01/08[place]22   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 2 diabetes mellitus (HCC) 01/08[place]22   Prolapsed internal hemorrhoids  05/04/2020   Vitamin D deficiency 05/04/2020   Other symptoms and signs involving cognitive functions and awareness 05/04/2020   Suicide attempt (HCC)    An[place] state 03/06/2020   Schizophrenia (HCC) 05/19[place]21   Bipolar I disorder, most recent episode depressed (HCC) 02/26[place]21   MDD (major depressive disorder) 10/10/2018   Schizoaffective disorder, bipolar type (HCC) 05/31[place]20   Affective psychosis, bipolar (HCC) 02/17[place]20   HTN (hypertension) 05/03/2018   Tobacco use disorder 05/03/2018   Adjustment disorder with emotional disturbance 01/02/2018   Schizophrenia, disorganized (HCC) 08/06[place]19   Moderate bipolar I disorder, most recent episode depressed (HCC)    PsMiddlesexosis (HCC)    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 08/03/2017   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   OCD (obsessive compulsive disorder) 10/05/2016   Major depressive disorder, recurrent episode, mild (HCC) 01/08[place]18   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   Learning disability 07/18/2015   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/2015   Diabetes mellitus (HCC) 03/23[place]17   MDD (major depressive disorder), recurrent, severe, with psychosis (HCC) 03/23[place]17   Hyperlipidemia 07/18/2015   Severe episode of recurrent major depressive disorder, without psychotic features (HCC)    SuWest Rushvilledal ideation    Drug overdose    Cognitive deficits 10/12/2012   Generalized anxiety disorder 06/28/2012    Past Surgical History:  Procedure Laterality Date   CESAREAN SECTION     CESAREAN SECTION N/A 04/25/2013   Procedure: REPEAT CESAREAN SECTION;  Surgeon: Peggy ConsMora Bellmanation: [facility];  SResacavice: Obstetrics;  Laterality:  N/A;   MASS EXCISION N/A 06/03/2012   Procedure: EXCISION MASS;  Surgeon: [name], MD;  Location: [place];  Service: ENT;  Laterality: N/A;  Excision uvula mass   TONSILLECTOMY N/A 06/03/2012   Procedure: TONSILLECTOMY;  Surgeon: [name], MD;  Location:  [place];  Service: ENT;  Laterality: N/A;   TONSILLECTOMY       OB History     Gravida  3   Para  3   Term  3   Preterm  0   AB  0   Living  3      SAB  0   IAB  0   Ectopic  0   Multiple      Live Births  3           Family History  Problem Relation Age of Onset   Hypertension Mother    Diabetes Father     Social History   Tobacco Use   Smoking status: Every Day    Types: Cigars   Smokeless tobacco: Never   Tobacco comments:    Pt declined  Vaping Use   Vaping Use: Never used  Substance Use Topics   Alcohol use: Not Currently   Drug use: Not Currently    Types: "Crack" cocaine, Other-see comments    Comment: Patient reports hx of smoking Crack    Home Medications Prior to Admission medications   Medication Sig Start Date End Date Taking? Authorizing Provider  amLODipine (NORVASC) 5 MG tablet Take 5 mg by mouth daily.   Yes [provider]  cholecalciferol (VITAMIN D) 25 MCG (1000 UNIT) tablet Take 1,000 Units by mouth daily. 04/30/20  Yes [provider]  haloperidol (HALDOL) 10 MG tablet Take 10-20 mg by mouth See admin instructions. 10 mg in the morning  20 mg at bedtime 05/09/20  Yes [provider]  omeprazole (PRILOSEC) 20 MG capsule Take 20 mg by mouth daily.   Yes [provider]  simvastatin (ZOCOR) 20 MG tablet Take 20 mg by mouth daily.   Yes [provider]  albuterol (VENTOLIN HFA) 108 (90 Base) MCG/ACT inhaler Inhale 1-2 puffs into the lungs every 6 (six) hours as needed for wheezing or shortness of breath. 01/11/21   Pearson Forster, NP  fluticasone (FLONASE) 50 MCG/ACT nasal spray Place 2 sprays into both nostrils daily. Patient taking differently: Place 2 sprays into both nostrils daily as needed for allergies. 01/11/21   Pearson Forster, NP  glimepiride (AMARYL) 4 MG tablet Take 4 mg by mouth daily.    [provider]  haloperidol decanoate (HALDOL DECANOATE) 100  MG/ML injection Inject 150 mg into the muscle every 28 (twenty-eight) days. Inject 16m (1.567m every 28 days.    [provider]  insulin detemir (LEVEMIR) 100 UNIT/ML injection Inject 0.4 mLs (40 Units total) into the skin 2 (two) times daily. 05/05/20   BrMontine CirclePA-C  lithium carbonate (ESKALITH) 450 MG CR tablet Take 450 mg by mouth daily.    [provider]  metroNIDAZOLE (FLAGYL) 500 MG tablet Take 1 tablet (500 mg total) by mouth 2 (two) times daily. One po bid x 7 days 03/16/21   YaDrenda FreezeMD  vitamin B-12 (CYANOCOBALAMIN) 1000 MCG tablet Take 1,000 mcg by mouth daily. 11/13/19   [provider]    Allergies    Wellbutrin [bupropion], Omnipaque [iohexol], Contrast media [iodinated diagnostic agents], Depakote [divalproex sodium], Hydroxyzine, and Penicillin g  Review of Systems   Review of Systems  Unable to perform ROS: Mental status change   Physical Exam Updated Vital Signs BP 108/76 (BP Location: Right Arm)   Pulse (!) 116   Temp (!) 97.5 F (36.4 C) (Bladder)   Resp 20   LMP  (LMP Unknown)   SpO2 100%   Physical Exam Vitals and nursing note reviewed.  Constitutional:      Comments: Somnolent but arousable to verbal and tactile stimulation, GCS 13  HENT:     Head: Normocephalic and atraumatic.  Eyes:     Conjunctiva/sclera: Conjunctivae normal.     Pupils: Pupils are equal, round, and reactive to light.  Cardiovascular:     Rate and Rhythm: Regular rhythm. Tachycardia present.  Pulmonary:     Effort: Pulmonary effort is normal. No respiratory distress.     Breath sounds: Normal breath sounds.  Abdominal:     General: There is no distension.     Tenderness: There is no abdominal tenderness. There is no guarding.  Musculoskeletal:        General: No deformity or signs of injury.     Cervical back: Neck supple.  Skin:    Findings: No lesion or rash.  Neurological:     General: No focal deficit present.     Comments:  Somnolent, GCS 13, moving all 4 extremities, not cooperative with exam, no clear cranial nerve deficit.    ED Results / Procedures / Treatments   Labs (all labs ordered are listed, but only abnormal results are displayed) Labs Reviewed  ACETAMINOPHEN LEVEL - Abnormal; Notable for the following components:      Result Value   Acetaminophen (Tylenol), Serum <10 (*)    All other components within normal limits  SALICYLATE LEVEL - Abnormal; Notable for the following components:   Salicylate Lvl <8.1 (*)    All other components within normal limits  LITHIUM LEVEL - Abnormal; Notable for the following components:   Lithium Lvl 0.30 (*)    All other components within normal limits  VALPROIC ACID LEVEL - Abnormal; Notable for the following components:   Valproic Acid Lvl <10 (*)    All other components within normal limits  CBC - Abnormal; Notable for the following components:   Hemoglobin 11.4 (*)    MCH 25.9 (*)    RDW 15.6 (*)    All other components within normal limits  COMPREHENSIVE METABOLIC PANEL - Abnormal; Notable for the following components:   Glucose, Bld 258 (*)    BUN 5 (*)    All other components within normal limits  GLUCOSE, CAPILLARY - Abnormal; Notable for the following components:   Glucose-Capillary 274 (*)    All other components within normal limits  I-STAT CHEM 8, ED - Abnormal; Notable for the following components:   BUN 5 (*)    Glucose, Bld 275 (*)    Calcium, Ion 1.00 (*)    All other components within normal limits  I-STAT ARTERIAL BLOOD GAS, ED - Abnormal; Notable for the following components:   pH, Arterial 7.319 (*)    Potassium 2.7 (*)    All other components within normal limits  RESP PANEL BY RT-PCR (FLU A&B, COVID) ARPGX2  MRSA NEXT GEN BY PCR, NASAL  TSH  MAGNESIUM  BLOOD GAS, ARTERIAL  ETHANOL  RAPID URINE DRUG SCREEN, HOSP PERFORMED  URINALYSIS, ROUTINE W REFLEX MICROSCOPIC  TRIGLYCERIDES  HEMOGLOBIN A1C  I-STAT BETA HCG BLOOD, ED  (MC, WL, AP ONLY)  CBG MONITORING, ED  CBG MONITORING, ED  CBG MONITORING, ED  CBG MONITORING, ED  CBG MONITORING, ED  CBG MONITORING, ED  CBG MONITORING, ED  CBG MONITORING, ED  CBG MONITORING, ED  CBG MONITORING, ED  CBG MONITORING, ED  CBG MONITORING, ED  CBG MONITORING, ED  CBG MONITORING, ED  CBG MONITORING, ED  CBG MONITORING, ED  CBG MONITORING, ED    EKG EKG Interpretation  Date/Time:  'Sunday March 23 2021 15:18:04 EST Ventricular Rate:  133 PR Interval:  114 QRS Duration: 92 QT Interval:  424 QTC Calculation: 631 R Axis:   93 Text Interpretation: Sinus tachycardia Consider right atrial enlargement Borderline right axis deviation Prolonged QT interval Confirmed by ,  ([id number]) on 03/23/2021 3:19:30 PM  Radiology DG Chest Port 1 View  Result Date: 03/23/2021 CLINICAL DATA:  Intubation EXAM: PORTABLE CHEST 1 VIEW COMPARISON:  Chest x-ray 05/04/2020 FINDINGS: Endotracheal tube tip is 2.5 cm above the carina. Enteric tube tip is below the diaphragm. Heart size is likely normal. Mild low lung volumes. No focal consolidation identified. No pleural effusion or pneumothorax. IMPRESSION: Medical devices as described.  No focal consolidations identified. Electronically Signed   By: Delaney  Williams M.D.   On: 03/23/2021 15:48    Procedures .Critical Care Performed by: , , MD Authorized by: , , MD   Critical care provider statement:    Critical care time (minutes):  62   Critical care was necessary to treat or prevent imminent or life-threatening deterioration of the following conditions:  [place]   Critical care was time spent personally by me on the following activities:  Discussions with consultants, examination of patient, obtaining history from patient or surrogate, ordering and performing treatments and interventions, ordering and review of laboratory studies, ordering and review of radiographic studies, pulse oximetry, review of  old charts and vascular access procedures Ultrasound ED Peripheral IV (Provider)  Date/Time: 03/23/2021 6:26 PM Performed by: , , MD Authorized by: , , MD   Procedure details:    Indications: poor IV access     Skin Prep: chlorhexidine gluconate     Location:  Right AC   Angiocath:  18 G   Bedside Ultrasound Guided: Yes     Images: not archived     Patient tolerated procedure without complications: Yes     Dressing applied: Yes   Comments:     18' g IV catheter placed with successful return of blood, unable to be fully advanced, subsequently pulled due to vessel failure and extravasation   Medications Ordered in ED Medications  rocuronium bromide 100 MG/10ML SOSY (0 mg  Hold 03/23/21 1529)  midazolam (VERSED) 2 MG/2ML injection (0 mg  Hold 03/23/21 1529)  etomidate (AMIDATE) 2 MG/ML injection (0 mg  Hold 03/23/21 1519)  fentaNYL (SUBLIMAZE) 50 MCG/ML injection (0 mcg  Hold 03/23/21 1520)  chlorhexidine gluconate (MEDLINE KIT) (PERIDEX) 0.12 % solution 15 mL (has no administration in time range)  MEDLINE mouth rinse (15 mLs Mouth Rinse Given 03/23/21 1706)  pantoprazole (PROTONIX) injection 40 mg (has no administration in time range)  docusate (COLACE) 50 MG/5ML liquid 100 mg (has no administration in time range)  polyethylene glycol (MIRALAX / GLYCOLAX) packet 17 g (has no administration in time range)  fentaNYL (SUBLIMAZE) injection 50 mcg (50 mcg Intravenous Given 03/23/21 1620)  fentaNYL (SUBLIMAZE) injection 50-200 mcg (has no administration in time range)  propofol (DIPRIVAN) 1000 MG/100ML infusion (30 mcg/kg/min  117.9 kg Intravenous New Bag/Given 03/23/21 1528)  0.9 %  sodium chloride infusion ( Intravenous New Bag/Given 03/23/21 1534)  insulin aspart (novoLOG) injection 0-15 Units (8 Units Subcutaneous Given 03/23/21 1659)  heparin injection 5,000 Units (has no administration in time range)  magnesium sulfate IVPB 2 g 50 mL (0 g Intravenous Stopped  03/23/21 1530)  calcium gluconate 1 g/ 50 mL sodium chloride IVPB (0 mg Intravenous Stopped 03/23/21 1531)    And  calcium gluconate 1 g/ 50 mL sodium chloride IVPB (0 mg Intravenous Stopped 03/23/21 1706)    And  calcium gluconate 1 g/ 50 mL sodium chloride IVPB ( Intravenous Infusion Verify 03/23/21 1730)  fentaNYL (SUBLIMAZE) injection (50 mcg Intravenous Given 03/23/21 1508)  midazolam (VERSED) injection (2 mg Intravenous Given 03/23/21 1508)  etomidate (AMIDATE) injection (20 mg Intravenous Given 03/23/21 1508)  rocuronium (ZEMURON) injection (100 mg Intravenous Given 03/23/21 1508)  charcoal activated (NO SORBITOL) (ACTIDOSE-AQUA) suspension 50 g (50 g Per Tube Given 03/23/21 1530)  magnesium sulfate IVPB 2 g 50 mL ( Intravenous Infusion Verify 03/23/21 1730)    ED Course  I have reviewed the triage vital signs and the nursing notes.  Pertinent labs & imaging results that were available during my care of the patient were reviewed by me and considered in my medical decision making (see chart for details).    MDM Rules/Calculators/A&P                           30 year old female with a medical history significant for bipolar disorder, DM 2, depression, obesity, prior suicide attempts who presents to the emergency department with concern for an intentional overdose of her home medications.  The history is provided by EMS as the patient was somnolent and unable provide a medical history.  Per EMS, the patient took multiple home medication packets from her pharmacy roughly 10 minutes prior to EMS arrival approximately at 12:55 PM.  The patient told EMS that she wanted to die.  With EMS, CBG was notably 299, her vitals were significant for heart rate 100, respiratory 18, saturating 98% on room air, BP 150/90.  List of medications ingested: Omezprazole 47m x7, Vit D 10047mx7, Prazosin 7m92m7, Simvastatin 36m70m Lithium 450mg67m Amlodipine 7mg x99mHydroxizine 50 mg x7, Benztropine  1mg x757mrimidone 50mg x147mivalproex 500mg x1119mldol 10mg x13.68mitial EKG performed concerning for prolonged QTC, sinus tachycardia, ventricular rate 113, PR interval 140, QRS interval 87, QTc 567, no clear ST segment changes to indicate coronary ischemia.  I spoke with [place] discussed the multiple ingestions reported by the patient.  Concern for calcium channel blocker overdose given the patient's ingestion of 35 mg of amlodipine.  Poison control recommendations included close hemodynamic monitoring, watch for bradycardia and hypotension, IV fluid boluses for volume resuscitation, serial EKGs to monitor QTC, administer IV magnesium, follow-up ingestion labs to include lithium level and Depakote level.  While in the emergency department, the patient was confused, ripped out multiple IVs and monitoring equipment.  QTC prolongation potentially from multiple ingested QT prolonging agents to include Haldol.  2 mg of IV magnesium was administered due to prolonged QT and risk for torsades de points.  Lithium and valproate levels were ordered and pending.  Ingestion labs to include Tylenol and salicylate levels were ordered and pending.  Plan for serial EKGs, follow-up laboratory work up, continue to monitor patient's mental status as it appears to be declining.  Consider prophylactic intubation due to patient combativeness  in the setting of toxic metabolic encephalopathy due to drug overdose and ingestion.  3 g calcium gluconate ordered due to concern for calcium channel blocker overdose.  Pulmonary critical care was consulted for further care and management.  Agree with plan to intubate.  Pulmonary critical care performed intubation bedside.  Please see their note for further MDM.  Activated charcoal administered after intubation was performed via OG tube.  Patient subsequently admitted to critical care.   Final Clinical Impression(s) / ED Diagnoses Final diagnoses:   Encounter for intubation  Intentional overdose, initial encounter ([place])  Prolonged Q-T interval on ECG    Rx / DC Orders ED Discharge Orders     None        Regan Lemming, MD 03/23/21 1835

## 2021-03-23 NOTE — Procedures (Signed)
Intubation Procedure Note  [name]  [id number]  03-Jul-1990  Date:03/23/21  Time:3:33 PM   Provider Performing:Brent Regnia Mathwig    Procedure: Intubation (51700)  Indication(s) Respiratory Failure  Consent Unable to obtain consent due to emergent nature of procedure.   Anesthesia Etomidate, Versed, Fentanyl, and Rocuronium   Time Out Verified patient identification, verified procedure, site/side was marked, verified correct patient position, special equipment/implants available, medications/allergies/relevant history reviewed, required imaging and test results available.   Sterile Technique Usual hand hygeine, masks, and gloves were used   Procedure Description Patient positioned in bed supine.  Sedation given as noted above.  Patient was intubated with endotracheal tube using  MAC 3 .  View was Grade 1 full glottis .  Number of attempts was 1.  Colorimetric CO2 detector was consistent with tracheal placement.   Complications/Tolerance None; patient tolerated the procedure well. Chest X-ray is ordered to verify placement.   EBL None   Specimen(s) None  Stacy Awkward, MD [place] [facility] Pager: [phone] Cell: [phone] After 7:00 pm call Elink  [phone]

## 2021-03-23 NOTE — ED Notes (Signed)
Pt stated that she still feels SI w/plan to OD and HI towards her boyfriend this morning. Accepted PO meds and breakfast w/o difficulty. Safety maintained and will continue to monitor.

## 2021-03-23 NOTE — ED Triage Notes (Signed)
Pt BIB [facility] from home d/t taking her home medication packets from pharmacy. EMS reports that there was 7 day noon packets & 3 day evening packets gone that pt endorses having took "about 10 min prior to their arrival" (she took the pills at approx. at 1255). Pt states this is not her first suicide attempt & that she hates life at wants to die. 150/90, 100 bpm, 18R, 98% RA, CBG 299.

## 2021-03-23 NOTE — H&P (Signed)
NAME:  [name], MRN:  [id number], DOB:  24-Dec-1990, LOS: 0 ADMISSION DATE:  03/23/2021, CONSULTATION DATE:  03/23/2021 REFERRING MD:  Dr. Armandina Gemma, CHIEF COMPLAINT:  SI/ OD   History of Present Illness:  HPI obtained from medical chart review and per ER staff as patient is encephalopathic.   30 year old female with history of bipolar, schizophrenia, and DMT2 who presents by EMS to [facility] ER after apparently calling EMS after intentional overdose on her home pre-packaged medications.    She was just released from [facility] around 1254 after having issues with her family and boyfriend.  She had verbalized homicidal ideations against her boyfriend with plans to overdose on her medications and admitted 11/26.  She was also just discharged from [facility] 4-6 weeks ago.    Patient admitted to ER staff that she took 7 day and 3 evening packets from her pre-packaged medication packets and took them 10 mins prior to EMS arrival in which she reportedly called.  She arrived to ER at 1354.  Verbalized to ER staff that she hated life and wanted to die.  Meds in packets listed below.    Omezprazole 20mg  x7, Vit D 1000mg  x7, Prazosin 5mg  x7, Simvastatin 20mg x7, Lithium 450mg  x7, Amlodipine 5mg  x7, Hydroxizine 50 mg x7, Benztropine 1mg  x7, Primidone 50mg  x11, Divalproex 500mg  x11, Haldol 10mg  x13  In ER, afebrile, tachycardic 112, BP 147/93, normal pulse ox, and lethargic but able to answer questions on arrival.  Labs pending.  Noted to have prolonged Qtc on EKG.  Calcium gluconate and Mag ordered in ER however patient pulled out all her IV's, currently working to re-establish.  She has not been given activated charcoal yet due to mental status.  PCCM called for admit.   On arrival to ER, patient was electively intubated to facilitate treatment, protect her from further self harm (ripping out Ivs/ interference with medical therapies), and airway protection.    Pertinent  Medical History  Tobacco  abuse, Bipolar, schizophrenia, OCD, prior SI, DMT2  Significant Hospital Events: Including procedures, antibiotic start and stop dates in addition to other pertinent events   11/27 admit with intentional OD/ SI attempt  Interim History / Subjective:   Objective   Blood pressure 129/87, pulse (!) 109, temperature 98.4 F (36.9 C), temperature source Oral, resp. rate (!) 23, SpO2 100 %.       No intake or output data in the 24 hours ending 03/23/21 1508 There were no vitals filed for this visit.  Examination: General:  Well nourished adult female minimally responsive on ER streter HEENT: MM pink/moist, ETT/ OGT, pupils 3/reactive Neuro: sedated on propofol CV: ST, no murmur PULM:  MV supported breaths, coarse throughout GI: obese, soft, bs active  Extremities: warm/dry, no LE edema  Skin: no rashes   Resolved Hospital Problem list    Assessment & Plan:   Intentional Overdose - haldol, divalproex, primidone, benztropine, hydroxyzine, amlodipine, lithium, simvastatin, prazosin, omeprazole and vit D Suicidal attempt Acute toxic/ metabolic encephalopathy  Prolonged Qtc  Hx bipolar and schizophrenia  - activated charcoal now per OGT, leave clamped for at least 1 hour  - poison control contacted in ER, follow recommendations - close monitoring of EKG's, q 3hrs, monitor for worsening Qtc prolongation or AV blocks - calcium gluconate 3gm now, if progressively symptomatic (bradycardic/ hypotensive, can consider changing to glucagon and high dose insulin - mag 2gm given, will give additional 2gm now - pending CBC and CMET - NS at 75  ml/hr, monitor UOP closely - pending acetaminophen, salicylate, valproic lithium ( low level 0.42 on 11/26), TSH, UDS - safety sitter when extubated.  Will need psych consult when able to participate and IVC papers if she tries to leave AMA till evaluated by psych   Acute respiratory insufficiency -  s/p intubation  - full MV support, 4-8cc/kg IBW with  goal Pplat <30 and DP<15  - VAP prevention protocol/ PPI - PAD protocol for sedation> propofol/ prn fentanyl, RASS goal -1/-2 - wean FiO2 as able for SpO2 >92%  - daily SAT & SBT  Hx HTN - hold all home meds  DMT2 - can be symptom of CCB OD, but has been hyperglycemic on previous labs - previous A1c 7.3 in May 2022, recheck now - SSI moderate  Best Practice (right click and "Reselect all SmartList Selections" daily)   Diet/type: NPO DVT prophylaxis: SCD GI prophylaxis: PPI Lines: N/A Foley:  N/A Code Status:  full code Last date of multidisciplinary goals of care discussion [pending]  Labs   CBC: Recent Labs  Lab 03/22/21 0025 03/22/21 0532 03/23/21 1451  WBC 14.0* 12.3*  --   NEUTROABS 8.5* 7.2  --   HGB 11.4* 11.3* 12.9  HCT 35.5* 35.3* 38.0  MCV 80.7 80.8  --   PLT 308 280  --     Basic Metabolic Panel: Recent Labs  Lab 03/22/21 0025 03/22/21 0532 03/23/21 1451  NA 133* 135 138  K 3.5 3.4* 3.8  CL 101 98 106  CO2 23 23  --   GLUCOSE 330* 280* 275*  BUN 9 6 5*  CREATININE 0.71 0.77 0.60  CALCIUM 8.9 8.9  --    GFR: Estimated Creatinine Clearance: 134 mL/min (by C-G formula based on SCr of 0.6 mg/dL). Recent Labs  Lab 03/22/21 0025 03/22/21 0532  WBC 14.0* 12.3*    Liver Function Tests: Recent Labs  Lab 03/22/21 0025 03/22/21 0532  AST 23 21  ALT 22 22  ALKPHOS 65 68  BILITOT 0.4 0.6  PROT 7.3 6.9  ALBUMIN 3.9 3.9   Recent Labs  Lab 03/22/21 0025  LIPASE 41   No results for input(s): AMMONIA in the last 168 hours.  ABG    Component Value Date/Time   TCO2 24 03/23/2021 1451     Coagulation Profile: No results for input(s): INR, PROTIME in the last 168 hours.  Cardiac Enzymes: No results for input(s): CKTOTAL, CKMB, CKMBINDEX, TROPONINI in the last 168 hours.  HbA1C: Hgb A1c MFr Bld  Date/Time Value Ref Range Status  08/26/2020 03:22 PM 7.3 (H) 4.8 - 5.6 % Final    Comment:    (NOTE) Pre diabetes:           5.7%-6.4%  Diabetes:              >6.4%  Glycemic control for   <7.0% adults with diabetes   05/03/2020 06:42 AM 5.6 4.8 - 5.6 % Final    Comment:    (NOTE) Pre diabetes:          5.7%-6.4%  Diabetes:              >6.4%  Glycemic control for   <7.0% adults with diabetes     CBG: Recent Labs  Lab 03/22/21 1004 03/22/21 1714 03/22/21 2105 03/23/21 0832 03/23/21 1130  GLUCAP 364* 260* 263* 321* 371*    Review of Systems:   unable  Past Medical History:  She,  has a past medical history of  Anxiety, Bipolar 1 disorder ([place]), Cognitive deficits, Depression, Diabetes mellitus without complication ([place]), Hypertension, Mental disorder, Mental health disorder, and Obesity.   Surgical History:   Past Surgical History:  Procedure Laterality Date   CESAREAN SECTION     CESAREAN SECTION N/A 04/25/2013   Procedure: REPEAT CESAREAN SECTION;  Surgeon: [name], MD;  Location: [facility];  Service: Obstetrics;  Laterality: N/A;   MASS EXCISION N/A 06/03/2012   Procedure: EXCISION MASS;  Surgeon: [name], MD;  Location: [place];  Service: ENT;  Laterality: N/A;  Excision uvula mass   TONSILLECTOMY N/A 06/03/2012   Procedure: TONSILLECTOMY;  Surgeon: [name], MD;  Location: [place];  Service: ENT;  Laterality: N/A;   TONSILLECTOMY       Social History:   reports that she has been smoking cigars. She has never used smokeless tobacco. She reports that she does not currently use alcohol. She reports that she does not currently use drugs after having used the following drugs: "Crack" cocaine and Other-see comments.   Family History:  Her family history includes Diabetes in her father; Hypertension in her mother.   Allergies Allergies  Allergen Reactions   Wellbutrin [Bupropion] Shortness Of Breath   Omnipaque [Iohexol] Swelling and Other (See Comments)    Eye swelling   Contrast Media [Iodinated Diagnostic Agents]     Eyes swell  up   Depakote [Divalproex Sodium]     Mouth sores. dizziness   Hydroxyzine     Causes hyperactivity, makes pt want to "fight"   Penicillin G Hives     Home Medications  Prior to Admission medications   Medication Sig Start Date End Date Taking? Authorizing Provider  albuterol (VENTOLIN HFA) 108 (90 Base) MCG/ACT inhaler Inhale 1-2 puffs into the lungs every 6 (six) hours as needed for wheezing or shortness of breath. 01/11/21   Pearson Forster, NP  amLODipine (NORVASC) 5 MG tablet Take 5 mg by mouth daily.    [provider]  cholecalciferol (VITAMIN D) 25 MCG (1000 UNIT) tablet Take 1,000 Units by mouth daily. 04/30/20   [provider]  fluticasone (FLONASE) 50 MCG/ACT nasal spray Place 2 sprays into both nostrils daily. Patient taking differently: Place 2 sprays into both nostrils daily as needed for allergies. 01/11/21   Pearson Forster, NP  glimepiride (AMARYL) 4 MG tablet Take 4 mg by mouth daily.    [provider]  haloperidol (HALDOL) 10 MG tablet Take 10-20 mg by mouth See admin instructions. 10 mg in the morning  20 mg at bedtime 05/09/20   [provider]  haloperidol decanoate (HALDOL DECANOATE) 100 MG/ML injection Inject 150 mg into the muscle every 28 (twenty-eight) days. Inject 150mg  (1.26ml) every 28 days.    [provider]  insulin detemir (LEVEMIR) 100 UNIT/ML injection Inject 0.4 mLs (40 Units total) into the skin 2 (two) times daily. 05/05/20   Montine Circle, PA-C  lithium carbonate (ESKALITH) 450 MG CR tablet Take 450 mg by mouth daily.    [provider]  metroNIDAZOLE (FLAGYL) 500 MG tablet Take 1 tablet (500 mg total) by mouth 2 (two) times daily. One po bid x 7 days 03/16/21   Drenda Freeze, MD  omeprazole (PRILOSEC) 20 MG capsule Take 20 mg by mouth daily.    [provider]  simvastatin (ZOCOR) 20 MG tablet Take 20 mg by mouth daily.    [provider]  vitamin B-12 (CYANOCOBALAMIN) 1000 MCG  tablet  Take 1,000 mcg by mouth daily. 11/13/19   [provider]     Critical care time: 32 mins     Kennieth Rad, ACNP [facility] 03/23/2021, 3:09 PM  See Amion for pager If no response to pager, please call PCCM consult pager After 7:00 pm call Elink

## 2021-03-23 NOTE — Progress Notes (Signed)
This CSW assisted pt with cab voucher home with [employer] [phone]. CSW informed that pt would be transported in cab #24 with the estimated cost of $18.00. CSW met with pt who agreed to be transported via cab. Pt denied any questions. Pt Alert and oriented x4 and reports that she agrees with the discharge plan.   Benjaman Kindler, MSW, [facility] 03/23/2021 12:58 PM

## 2021-03-23 NOTE — ED Notes (Signed)
Pt sleeping@this time. Breathing even and unlabored. Will continue to monitor for safety 

## 2021-03-23 NOTE — ED Notes (Signed)
EMS reports to this RN that pt endorses having took 7 day packets & 3 Evening packets of her prescribed medications that is pre-packaged from pharmacy. EMS estimated & was verified by pt that she took: [employer] 20mg  x7, Vit D 1000mg  x7, Prazosin 5mg  x7, Simvastatin 20mg x7, Lithium 450mg  x7, Amlodipine 5mg  x7, Hydroxizine 50 mg x7, Benztropine 1mg  x7, Primidone 50mg  x11, Divalproex 500mg  x11, Haldol 10mg  x13. Pt is lethargic but alert upon arrival. A/Ox4, verbal-able to make needs known. EMS lastly reports she took these at an estimated time of 1255.

## 2021-03-24 DIAGNOSIS — T50902A Poisoning by unspecified drugs, medicaments and biological substances, intentional self-harm, initial encounter: Secondary | ICD-10-CM | POA: Diagnosis not present

## 2021-03-24 LAB — PHOSPHORUS: Phosphorus: 2.5 mg/dL (ref 2.5–4.6)

## 2021-03-24 LAB — AMMONIA: Ammonia: 43 umol/L — ABNORMAL HIGH (ref 9–35)

## 2021-03-24 LAB — POCT I-STAT 7, (LYTES, BLD GAS, ICA,H+H)
Acid-Base Excess: 2 mmol/L (ref 0.0–2.0)
Bicarbonate: 25.7 mmol/L (ref 20.0–28.0)
Calcium, Ion: 1.22 mmol/L (ref 1.15–1.40)
HCT: 33 % — ABNORMAL LOW (ref 36.0–46.0)
Hemoglobin: 11.2 g/dL — ABNORMAL LOW (ref 12.0–15.0)
O2 Saturation: 100 %
Patient temperature: 37
Potassium: 3.6 mmol/L (ref 3.5–5.1)
Sodium: 140 mmol/L (ref 135–145)
TCO2: 27 mmol/L (ref 22–32)
pCO2 arterial: 34.3 mmHg (ref 32.0–48.0)
pH, Arterial: 7.482 — ABNORMAL HIGH (ref 7.350–7.450)
pO2, Arterial: 356 mmHg — ABNORMAL HIGH (ref 83.0–108.0)

## 2021-03-24 LAB — BASIC METABOLIC PANEL
Anion gap: 12 (ref 5–15)
BUN: 6 mg/dL (ref 6–20)
CO2: 20 mmol/L — ABNORMAL LOW (ref 22–32)
Calcium: 9 mg/dL (ref 8.9–10.3)
Chloride: 105 mmol/L (ref 98–111)
Creatinine, Ser: 0.85 mg/dL (ref 0.44–1.00)
GFR, Estimated: >60 mL/min (ref >60)
Glucose, Bld: 283 mg/dL — ABNORMAL HIGH (ref 70–99)
Potassium: 3.9 mmol/L (ref 3.5–5.1)
Sodium: 137 mmol/L (ref 135–145)

## 2021-03-24 LAB — CBC
HCT: 33.9 % — ABNORMAL LOW (ref 36.0–46.0)
Hemoglobin: 11 g/dL — ABNORMAL LOW (ref 12.0–15.0)
MCH: 25.9 pg — ABNORMAL LOW (ref 26.0–34.0)
MCHC: 32.4 g/dL (ref 30.0–36.0)
MCV: 80 fL (ref 80.0–100.0)
Platelets: 275 10*3/uL (ref 150–400)
RBC: 4.24 MIL/uL (ref 3.87–5.11)
RDW: 15.5 % (ref 11.5–15.5)
WBC: 13.4 10*3/uL — ABNORMAL HIGH (ref 4.0–10.5)
nRBC: 0 % (ref 0.0–0.2)

## 2021-03-24 LAB — ACETAMINOPHEN LEVEL: Acetaminophen (Tylenol), Serum: <10 ug/mL — ABNORMAL LOW (ref 10–30)

## 2021-03-24 LAB — LITHIUM LEVEL: Lithium Lvl: 0.13 mmol/L — ABNORMAL LOW (ref 0.60–1.20)

## 2021-03-24 LAB — VALPROIC ACID LEVEL: Valproic Acid Lvl: <10 ug/mL — ABNORMAL LOW (ref 50.0–100.0)

## 2021-03-24 LAB — GLUCOSE, CAPILLARY
Glucose-Capillary: 181 mg/dL — ABNORMAL HIGH (ref 70–99)
Glucose-Capillary: 208 mg/dL — ABNORMAL HIGH (ref 70–99)
Glucose-Capillary: 255 mg/dL — ABNORMAL HIGH (ref 70–99)
Glucose-Capillary: 257 mg/dL — ABNORMAL HIGH (ref 70–99)
Glucose-Capillary: 258 mg/dL — ABNORMAL HIGH (ref 70–99)
Glucose-Capillary: 304 mg/dL — ABNORMAL HIGH (ref 70–99)

## 2021-03-24 LAB — TRIGLYCERIDES: Triglycerides: 429 mg/dL — ABNORMAL HIGH (ref <150)

## 2021-03-24 LAB — MAGNESIUM: Magnesium: 2.1 mg/dL (ref 1.7–2.4)

## 2021-03-24 LAB — HEMOGLOBIN A1C
Hgb A1c MFr Bld: 8.8 % — ABNORMAL HIGH (ref 4.8–5.6)
Mean Plasma Glucose: 206 mg/dL

## 2021-03-24 MED ORDER — CHLORHEXIDINE GLUCONATE CLOTH 2 % EX PADS
6.0000 | MEDICATED_PAD | CUTANEOUS | Status: DC
  Administered 2021-03-23 – 2021-03-26 (×4): 6 via TOPICAL

## 2021-03-24 MED ORDER — VITAL 1.5 CAL PO LIQD
1000.0000 mL | ORAL | Status: DC
  Administered 2021-03-24 – 2021-03-25 (×3): 1000 mL

## 2021-03-24 MED ORDER — INSULIN DETEMIR 100 UNIT/ML ~~LOC~~ SOLN
30.0000 [IU] | Freq: Two times a day (BID) | SUBCUTANEOUS | Status: DC
  Administered 2021-03-24 (×2): 30 [IU] via SUBCUTANEOUS
  Filled 2021-03-24 (×4): qty 0.3

## 2021-03-24 MED ORDER — PANTOPRAZOLE 2 MG/ML SUSPENSION
40.0000 mg | Freq: Every day | ORAL | Status: DC
  Administered 2021-03-24: 09:00:00 40 mg
  Filled 2021-03-24 (×2): qty 20

## 2021-03-24 MED ORDER — PROSOURCE TF PO LIQD
45.0000 mL | Freq: Four times a day (QID) | ORAL | Status: DC
  Administered 2021-03-24 (×4): 45 mL
  Filled 2021-03-24 (×5): qty 45

## 2021-03-24 MED ORDER — LACTATED RINGERS IV BOLUS
1000.0000 mL | Freq: Once | INTRAVENOUS | Status: AC
  Administered 2021-03-24: 18:00:00 1000 mL via INTRAVENOUS

## 2021-03-24 MED ORDER — DEXMEDETOMIDINE HCL IN NACL 400 MCG/100ML IV SOLN
0.4000 ug/kg/h | INTRAVENOUS | Status: DC
  Administered 2021-03-24 (×3): 0.8 ug/kg/h via INTRAVENOUS
  Administered 2021-03-24: 09:00:00 0.4 ug/kg/h via INTRAVENOUS
  Administered 2021-03-25 (×2): 0.8 ug/kg/h via INTRAVENOUS
  Filled 2021-03-24: qty 200
  Filled 2021-03-24 (×4): qty 100

## 2021-03-24 NOTE — Progress Notes (Signed)
Initial Nutrition Assessment  DOCUMENTATION CODES:   Not applicable  INTERVENTION:   Initiate tube feeds via OG tube: - Vital 1.5 @ 50 ml/hr (1200 ml/day) - ProSource TF 45 ml QID  Tube feeding regimen provides 1960 kcal, 125 grams of protein, and 917 ml of H2O.  NUTRITION DIAGNOSIS:   Inadequate oral intake related to inability to eat as evidenced by NPO status.  GOAL:   Patient will meet greater than or equal to 90% of their needs  MONITOR:   Vent status, Labs, Weight trends, TF tolerance, I & O's  REASON FOR ASSESSMENT:   Ventilator, Consult Enteral/tube feeding initiation and management  ASSESSMENT:   30 year old female who presented to the ED on 11/27 after an intentional overdose. PMH of anxiety, bipolar 1 disorder, schizophrenia, depression, T2DM, HTN. Pt required intubation for airway protection.  Consult received for enteral nutrition initiation and management. Pt with OG tube below the diaphragm per chest-xray on 11/27. OG tube is currently to low intermittent suction.  Unable to obtain diet and weight history at this time. Reviewed weight history in chart. Pt's weight has fluctuated between 113-122 kg over the last year.  Patient is currently intubated on ventilator support MV: 9.6 L/min Temp (24hrs), Avg:98.5 F (36.9 C), Min:97 F (36.1 C), Max:99.3 F (37.4 C)  Drips: Propofol: 24.8 ml/hr (weaning off per [facility]) Precedex  Medications reviewed and include: colace, SSI q 4 hours, levemir 30 units BID, protonix, miralax  Labs reviewed: TG 429 CBG's: 181-371 x 24 hours  UOP: 745 ml x 24 hours OGT: 450 ml x 12 hours I/O's: +624 ml since admit  NUTRITION - FOCUSED PHYSICAL EXAM:  Unable to complete at this time. RD working remotely.  Diet Order:   Diet Order             Diet NPO time specified  Diet effective now                   EDUCATION NEEDS:   No education needs have been identified at this time  Skin:  Skin Assessment:  Reviewed RN Assessment  Last BM:  no documented BM  Height:   Ht Readings from Last 1 Encounters:  03/16/21 5\' 7"  (1.702 m)    Weight:   Wt Readings from Last 1 Encounters:  03/24/21 118.8 kg    Ideal Body Weight:  61.4 kg  BMI:  Body mass index is 41.02 kg/m.  Estimated Nutritional Needs:   Kcal:  1900-2100  Protein:  120-140 grams  Fluid:  >/= 1.8 L    Gustavus Bryant, MS, RD, LDN Inpatient Clinical Dietitian Please see AMiON for contact information.

## 2021-03-24 NOTE — Progress Notes (Signed)
NAME:  [name], MRN:  [id number], DOB:  [birth year], LOS: 1 ADMISSION DATE:  03/23/2021, CONSULTATION DATE:  03/23/2021 REFERRING MD:  Dr. Armandina Gemma, CHIEF COMPLAINT:  SI/ OD   History of Present Illness:  HPI obtained from medical chart review and per ER staff as patient is encephalopathic.   30 year old female with history of bipolar, schizophrenia, and DMT2 who presents by EMS to [facility] ER after apparently calling EMS after intentional overdose on her home pre-packaged medications.    She was just released from [facility] around 1254 after having issues with her family and boyfriend.  She had verbalized homicidal ideations against her boyfriend with plans to overdose on her medications and admitted 11/26.  She was also just discharged from [facility] 4-6 weeks ago.    Patient admitted to ER staff that she took 7 day and 3 evening packets from her pre-packaged medication packets and took them 10 mins prior to EMS arrival in which she reportedly called.  She arrived to ER at 1354.  Verbalized to ER staff that she hated life and wanted to die.  Meds in packets listed below.    Omezprazole 20mg  x7, Vit D 1000mg  x7, Prazosin 5mg  x7, Simvastatin 20mg x7, Lithium 450mg  x7, Amlodipine 5mg  x7, Hydroxizine 50 mg x7, Benztropine 1mg  x7, Primidone 50mg  x11, Divalproex 500mg  x11, Haldol 10mg  x13  In ER, afebrile, tachycardic 112, BP 147/93, normal pulse ox, and lethargic but able to answer questions on arrival.  Labs pending.  Noted to have prolonged Qtc on EKG.  Calcium gluconate and Mag ordered in ER however patient pulled out all her IV's, currently working to re-establish.  She has not been given activated charcoal yet due to mental status.  PCCM called for admit.   On arrival to ER, patient was electively intubated to facilitate treatment, protect her from further self harm (ripping out Ivs/ interference with medical therapies), and airway protection.    Pertinent  Medical History  Tobacco  abuse, Bipolar, schizophrenia, OCD, prior SI, DMT2  Significant Hospital Events: Including procedures, antibiotic start and stop dates in addition to other pertinent events   11/27 admit with intentional OD/ SI attempt 11/28 doing ok, awakes to voice, stop prop, start precedex, Tfs ordered  Interim History / Subjective:  NAON, awakes to voice, sleepy Objective   Blood pressure 115/77, pulse 69, temperature 99.3 F (37.4 C), resp. rate 20, weight 118.8 kg, SpO2 100 %.    Vent Mode: PRVC FiO2 (%):  [40 %-100 %] 40 % Set Rate:  [18 bmp-20 bmp] 20 bmp Vt Set:  [490 mL] 490 mL PEEP:  [5 cmH20-8 cmH20] 8 cmH20 Plateau Pressure:  [19 cmH20-22 cmH20] 20 cmH20   Intake/Output Summary (Last 24 hours) at 03/24/2021 1039 Last data filed at 03/24/2021 1038 Gross per 24 hour  Intake 2167.96 ml  Output 1255 ml  Net 912.96 ml   Filed Weights   03/24/21 0147  Weight: 118.8 kg    Examination: General:  Well nourished adult female, in NAD HEENT: MM pink/moist, ETT/ OGT Neuro: sedated on propofol, and precedex CV: ST, no murmur PULM:  MV supported breaths, coarse throughout GI: obese, soft, bs active  Extremities: warm/dry, no LE edema  Skin: no rashes   Resolved Hospital Problem list    Assessment & Plan:   Intentional Overdose - haldol, divalproex, primidone, benztropine, hydroxyzine, amlodipine, lithium, simvastatin, prazosin, omeprazole and vit D Suicidal attempt Acute toxic/ metabolic encephalopathy  Prolonged Qtc  Hx bipolar and schizophrenia  -  activated charcoal 11/27  - poison control contacted in ER, follow recommendations - close monitoring of EKG's, q shift, monitor for worsening Qtc prolongation or AV blocks - s/p aggressive Mag and Ca replacement - Qtc, PR interval stable - acetaminophen, salicylate, valproic levels undetectable lithium ( low level 0.42, o.3. 0.13 on serial measurements) - TSH WNL - UDS barbiturates (non-specific) and benzos (midazolam given prior  to collection) - safety sitter when extubated.  Will need psych consult when able to participate and IVC papers if she tries to leave AMA till evaluated by psych  Acute respiratory insufficiency - PRVC, PSV as tolerated - VAP prevention protocol/ PPI - PAD protocol for sedation>precedex (d/c prop with hyper TG) prn fentanyl, RASS goal 0 to -1 - wean FiO2 as able for SpO2 >92%  - daily SAT & SBT  Hx HTN - hold all home meds  DMT2 - can be symptom of CCB OD, but has been hyperglycemic on previous labs - previous A1c 7.3 in May 2022, recheck now - SSI moderate  Best Practice (right click and "Reselect all SmartList Selections" daily)   Diet/type: tubefeeds DVT prophylaxis: SCD GI prophylaxis: PPI Lines: N/A Foley:  N/A Code Status:  full code Last date of multidisciplinary goals of care discussion [pending]  Labs   CBC: Recent Labs  Lab 03/22/21 0025 03/22/21 0532 03/23/21 1451 03/23/21 1506 03/23/21 1623 03/24/21 0046 03/24/21 0352  WBC 14.0* 12.3*  --  9.6  --  13.4*  --   NEUTROABS 8.5* 7.2  --   --   --   --   --   HGB 11.4* 11.3* 12.9 11.4* 12.6 11.0* 11.2*  HCT 35.5* 35.3* 38.0 36.6 37.0 33.9* 33.0*  MCV 80.7 80.8  --  83.2  --  80.0  --   PLT 308 280  --  216  --  275  --      Basic Metabolic Panel: Recent Labs  Lab 03/22/21 0025 03/22/21 0532 03/23/21 1418 03/23/21 1451 03/23/21 1506 03/23/21 1623 03/23/21 2155 03/24/21 0046 03/24/21 0352  NA 133* 135  --  138 137 141 137 137 140  K 3.5 3.4*  --  3.8 3.5 2.7* 4.1 3.9 3.6  CL 101 98  --  106 105  --  103 105  --   CO2 23 23  --   --  23  --  24 20*  --   GLUCOSE 330* 280*  --  275* 258*  --  314* 283*  --   BUN 9 6  --  5* 5*  --  <5* 6  --   CREATININE 0.71 0.77  --  0.60 0.75  --  0.82 0.85  --   CALCIUM 8.9 8.9  --   --  8.9  --  9.1 9.0  --   MG  --   --  1.8  --   --   --  2.2  --   --     GFR: Estimated Creatinine Clearance: 126.8 mL/min (by C-G formula based on SCr of 0.85  mg/dL). Recent Labs  Lab 03/22/21 0025 03/22/21 0532 03/23/21 1506 03/24/21 0046  WBC 14.0* 12.3* 9.6 13.4*     Liver Function Tests: Recent Labs  Lab 03/22/21 0025 03/22/21 0532 03/23/21 1506  AST 23 21 26   ALT 22 22 21   ALKPHOS 65 68 63  BILITOT 0.4 0.6 0.6  PROT 7.3 6.9 6.7  ALBUMIN 3.9 3.9 3.7    Recent Labs  Lab  03/22/21 0025  LIPASE 41    Recent Labs  Lab 03/24/21 0118  AMMONIA 43*    ABG    Component Value Date/Time   PHART 7.482 (H) 03/24/2021 0352   PCO2ART 34.3 03/24/2021 0352   PO2ART 356 (H) 03/24/2021 0352   HCO3 25.7 03/24/2021 0352   TCO2 27 03/24/2021 0352   ACIDBASEDEF 2.0 03/23/2021 1623   O2SAT 100.0 03/24/2021 0352      Coagulation Profile: No results for input(s): INR, PROTIME in the last 168 hours.  Cardiac Enzymes: No results for input(s): CKTOTAL, CKMB, CKMBINDEX, TROPONINI in the last 168 hours.  HbA1C: Hgb A1c MFr Bld  Date/Time Value Ref Range Status  03/22/2021 05:32 AM 8.8 (H) 4.8 - 5.6 % Final    Comment:    (NOTE)         Prediabetes: 5.7 - 6.4         Diabetes: >6.4         Glycemic control for adults with diabetes: <7.0   08/26/2020 03:22 PM 7.3 (H) 4.8 - 5.6 % Final    Comment:    (NOTE) Pre diabetes:          5.7%-6.4%  Diabetes:              >6.4%  Glycemic control for   <7.0% adults with diabetes     CBG: Recent Labs  Lab 03/23/21 1640 03/23/21 1942 03/23/21 2333 03/24/21 0323 03/24/21 0731  GLUCAP 274* 352* 273* 255* 181*     Review of Systems:   unable  Past Medical History:  She,  has a past medical history of Anxiety, Bipolar 1 disorder ([place]), Cognitive deficits, Depression, Diabetes mellitus without complication ([place]), Hypertension, Mental disorder, Mental health disorder, and Obesity.   Surgical History:   Past Surgical History:  Procedure Laterality Date   CESAREAN SECTION     CESAREAN SECTION N/A 04/25/2013   Procedure: REPEAT CESAREAN SECTION;  Surgeon: [name],  MD;  Location: [place] [facility];  Service: Obstetrics;  Laterality: N/A;   MASS EXCISION N/A 06/03/2012   Procedure: EXCISION MASS;  Surgeon: [name], MD;  Location: [place];  Service: ENT;  Laterality: N/A;  Excision uvula mass   TONSILLECTOMY N/A 06/03/2012   Procedure: TONSILLECTOMY;  Surgeon: [name], MD;  Location: [place];  Service: ENT;  Laterality: N/A;   TONSILLECTOMY       Social History:   reports that she has been smoking cigars. She has never used smokeless tobacco. She reports that she does not currently use alcohol. She reports that she does not currently use drugs after having used the following drugs: "Crack" cocaine and Other-see comments.   Family History:  Her family history includes Diabetes in her father; Hypertension in her mother.   Allergies Allergies  Allergen Reactions   Wellbutrin [Bupropion] Shortness Of Breath   Omnipaque [Iohexol] Swelling and Other (See Comments)    Eye swelling   Contrast Media [Iodinated Diagnostic Agents]     Eyes swell up   Depakote [Divalproex Sodium]     Mouth sores. dizziness   Hydroxyzine     Causes hyperactivity, makes pt want to "fight"   Penicillin G Hives     Home Medications  Prior to Admission medications   Medication Sig Start Date End Date Taking? Authorizing Provider  albuterol (VENTOLIN HFA) 108 (90 Base) MCG/ACT inhaler Inhale 1-2 puffs into the lungs every 6 (six) hours as needed for wheezing or shortness of breath.  01/11/21   Pearson Forster, NP  amLODipine (NORVASC) 5 MG tablet Take 5 mg by mouth daily.    [provider]  cholecalciferol (VITAMIN D) 25 MCG (1000 UNIT) tablet Take 1,000 Units by mouth daily. 04/30/20   [provider]  fluticasone (FLONASE) 50 MCG/ACT nasal spray Place 2 sprays into both nostrils daily. Patient taking differently: Place 2 sprays into both nostrils daily as needed for allergies. 01/11/21   Pearson Forster, NP  glimepiride  (AMARYL) 4 MG tablet Take 4 mg by mouth daily.    [provider]  haloperidol (HALDOL) 10 MG tablet Take 10-20 mg by mouth See admin instructions. 10 mg in the morning  20 mg at bedtime 05/09/20   [provider]  haloperidol decanoate (HALDOL DECANOATE) 100 MG/ML injection Inject 150 mg into the muscle every 28 (twenty-eight) days. Inject 150mg  (1.47ml) every 28 days.    [provider]  insulin detemir (LEVEMIR) 100 UNIT/ML injection Inject 0.4 mLs (40 Units total) into the skin 2 (two) times daily. 05/05/20   Montine Circle, PA-C  lithium carbonate (ESKALITH) 450 MG CR tablet Take 450 mg by mouth daily.    [provider]  metroNIDAZOLE (FLAGYL) 500 MG tablet Take 1 tablet (500 mg total) by mouth 2 (two) times daily. One po bid x 7 days 03/16/21   Drenda Freeze, MD  omeprazole (PRILOSEC) 20 MG capsule Take 20 mg by mouth daily.    [provider]  simvastatin (ZOCOR) 20 MG tablet Take 20 mg by mouth daily.    [provider]  vitamin B-12 (CYANOCOBALAMIN) 1000 MCG tablet Take 1,000 mcg by mouth daily. 11/13/19   [provider]     Critical care time:    CRITICAL CARE Performed by: Lanier Clam   Total critical care time: 40 minutes  Critical care time was exclusive of separately billable procedures and treating other patients.  Critical care was necessary to treat or prevent imminent or life-threatening deterioration.  Critical care was time spent personally by me on the following activities: development of treatment plan with patient and/or surrogate as well as nursing, discussions with consultants, evaluation of patient's response to treatment, examination of patient, obtaining history from patient or surrogate, ordering and performing treatments and interventions, ordering and review of laboratory studies, ordering and review of radiographic studies, pulse oximetry and re-evaluation of patient's  condition.   Lanier Clam, MD [facility] 03/24/2021, 10:39 AM  See Amion for contact info If no response to pager, please call PCCM consult pager After 7:00 pm call Elink

## 2021-03-24 NOTE — Progress Notes (Signed)
eLink Physician-Brief Progress Note Patient Name: [name] DOB: Dec 17, 1990 MRN: [id number]   Date of Service  03/24/2021  HPI/Events of Note  TG elevated to 429.  eICU Interventions  If next TG higher, then prop will need to be stopped.  Can try to minimize dose at this time     Intervention Category Minor Interventions: Other:  Tilden Dome 03/24/2021, 2:55 AM

## 2021-03-24 NOTE — Progress Notes (Signed)
This RN was approached by Randel Pigg, who stated that he was patient's boyfriend. Mr. Braulio Conte asked numerous questions regarding patient's status, however, I informed him that without permission from the patient or patient's next of kin I was unable to provide any information. I then spoke with patient's mother, Annabelle Rexroad, at number listed in patient's chart. Mother confirmed that patient's boyfriend, Randel Pigg, should not be allowed to visit patient at this time due to Mr. Dimas Chyle erratic behavior and concern for potential negative impacts on patient's well-being. Front desk and security notified.

## 2021-03-25 ENCOUNTER — Telehealth (HOSPITAL_COMMUNITY): Payer: Self-pay | Admitting: Physician Assistant

## 2021-03-25 DIAGNOSIS — T50902A Poisoning by unspecified drugs, medicaments and biological substances, intentional self-harm, initial encounter: Secondary | ICD-10-CM | POA: Diagnosis not present

## 2021-03-25 LAB — GLUCOSE, CAPILLARY
Glucose-Capillary: 154 mg/dL — ABNORMAL HIGH (ref 70–99)
Glucose-Capillary: 194 mg/dL — ABNORMAL HIGH (ref 70–99)
Glucose-Capillary: 212 mg/dL — ABNORMAL HIGH (ref 70–99)
Glucose-Capillary: 288 mg/dL — ABNORMAL HIGH (ref 70–99)
Glucose-Capillary: 290 mg/dL — ABNORMAL HIGH (ref 70–99)
Glucose-Capillary: 314 mg/dL — ABNORMAL HIGH (ref 70–99)

## 2021-03-25 LAB — CBC WITH DIFFERENTIAL/PLATELET
Abs Immature Granulocytes: 0.04 10*3/uL (ref 0.00–0.07)
Basophils Absolute: 0.1 10*3/uL (ref 0.0–0.1)
Basophils Relative: 0 %
Eosinophils Absolute: 0.3 10*3/uL (ref 0.0–0.5)
Eosinophils Relative: 2 %
HCT: 35.1 % — ABNORMAL LOW (ref 36.0–46.0)
Hemoglobin: 11.2 g/dL — ABNORMAL LOW (ref 12.0–15.0)
Immature Granulocytes: 0 %
Lymphocytes Relative: 23 %
Lymphs Abs: 3.1 10*3/uL (ref 0.7–4.0)
MCH: 25.9 pg — ABNORMAL LOW (ref 26.0–34.0)
MCHC: 31.9 g/dL (ref 30.0–36.0)
MCV: 81.1 fL (ref 80.0–100.0)
Monocytes Absolute: 0.9 10*3/uL (ref 0.1–1.0)
Monocytes Relative: 7 %
Neutro Abs: 8.7 10*3/uL — ABNORMAL HIGH (ref 1.7–7.7)
Neutrophils Relative %: 68 %
Platelets: 219 10*3/uL (ref 150–400)
RBC: 4.33 MIL/uL (ref 3.87–5.11)
RDW: 15.3 % (ref 11.5–15.5)
WBC: 13.1 10*3/uL — ABNORMAL HIGH (ref 4.0–10.5)
nRBC: 0 % (ref 0.0–0.2)

## 2021-03-25 LAB — PHOSPHORUS
Phosphorus: 3.4 mg/dL (ref 2.5–4.6)
Phosphorus: 3.8 mg/dL (ref 2.5–4.6)

## 2021-03-25 LAB — HEMOGLOBIN A1C
Hgb A1c MFr Bld: 8.9 % — ABNORMAL HIGH (ref 4.8–5.6)
Mean Plasma Glucose: 209 mg/dL

## 2021-03-25 LAB — BASIC METABOLIC PANEL
Anion gap: 9 (ref 5–15)
BUN: 13 mg/dL (ref 6–20)
CO2: 22 mmol/L (ref 22–32)
Calcium: 8.8 mg/dL — ABNORMAL LOW (ref 8.9–10.3)
Chloride: 106 mmol/L (ref 98–111)
Creatinine, Ser: 0.92 mg/dL (ref 0.44–1.00)
GFR, Estimated: >60 mL/min (ref >60)
Glucose, Bld: 311 mg/dL — ABNORMAL HIGH (ref 70–99)
Potassium: 3.6 mmol/L (ref 3.5–5.1)
Sodium: 137 mmol/L (ref 135–145)

## 2021-03-25 LAB — MAGNESIUM
Magnesium: 1.9 mg/dL (ref 1.7–2.4)
Magnesium: 2.2 mg/dL (ref 1.7–2.4)

## 2021-03-25 MED ORDER — HEPARIN SODIUM (PORCINE) 5000 UNIT/ML IJ SOLN
INTRAMUSCULAR | Status: AC
  Administered 2021-03-25: 5000 [IU] via SUBCUTANEOUS
  Filled 2021-03-25: qty 1

## 2021-03-25 MED ORDER — HALOPERIDOL LACTATE 5 MG/ML IJ SOLN
5.0000 mg | Freq: Every day | INTRAMUSCULAR | Status: DC
Start: 2021-03-25 — End: 2021-03-26
  Administered 2021-03-25: 5 mg via INTRAVENOUS
  Filled 2021-03-25: qty 1

## 2021-03-25 MED ORDER — INSULIN DETEMIR 100 UNIT/ML ~~LOC~~ SOLN
40.0000 [IU] | Freq: Two times a day (BID) | SUBCUTANEOUS | Status: DC
  Administered 2021-03-25 – 2021-03-28 (×7): 40 [IU] via SUBCUTANEOUS
  Filled 2021-03-25 (×11): qty 0.4

## 2021-03-25 MED ORDER — MAGNESIUM SULFATE 2 GM/50ML IV SOLN
INTRAVENOUS | Status: AC
  Filled 2021-03-25: qty 50

## 2021-03-25 MED ORDER — MAGNESIUM SULFATE 2 GM/50ML IV SOLN
2.0000 g | Freq: Once | INTRAVENOUS | Status: AC
  Administered 2021-03-25: 2 g via INTRAVENOUS

## 2021-03-25 NOTE — BH Assessment (Signed)
Care Management - Follow Up Discharges   Writer attempted to make contact with patient today and was unsuccessful.  Writer left a HIPPA compliant voice message.   Per chart review, patient will follow up with her established provider [facility] ([facility])

## 2021-03-25 NOTE — Procedures (Signed)
Extubation Procedure Note  Patient Details:   Name: [name] DOB: [birth year] MRN: [id number]   Airway Documentation:    Vent end date: 03/25/21 Vent end time: 1005   Evaluation  O2 sats: stable throughout Complications: No apparent complications Patient did tolerate procedure well. Bilateral Breath Sounds: Rhonchi, Diminished   Yes  Patient had a positive cuff leak prior to extubation. Patient able to speak at this time with no stridor noted. Patient does have secretions in the back of throat, which RT encouraged patient to try to cough up. Patient tolerating room air well at this time.   Reizy A Asmaa Tirpak 03/25/2021, 10:13 AM

## 2021-03-25 NOTE — Consult Note (Signed)
[place] [facility] New Psychiatric Evaluation   Service Date: March 25, 2021 LOS:  LOS: 2 days    Assessment  [name] is a 30 y.o. female admitted medically for 03/23/2021  1:39 PM for an overdose on home medications. She was notably discharged from the [facility] shortly  She carries the psychiatric diagnoses of schizoaffective disorder (vs bipolar I) and borderline personality disorder and has a past medical history of cognitive deficits, diabetes, hypertension and obesity. Psychiatry was consulted for suicide attempt by [name] NP.    Her current presentation of an impulsive suicide attempt is most consistent with known diagnosis of borderline personality disorder; pt was seen shortly prior to this attempt and at that time patient displayed no overt (or reported) symptoms of psychosis and was denying suicidal ideation with a plan to engage with ACT team for followup. She does have significant depressive symptoms  She had been cooperative through her stay at [facility], with some disagreements with staff (pt attempting to get her boyfriend admitted to same facility); she denied suicidal ideations to [facility] staff shortly prior to discharge. She overdosed on a subset of her home medications (unlikely that she OD'd on lithium, depakote given serum levels through admission ED). She was ultimately intubated due to agitation and inability to comply with medical treatment and found to have a significantly elevated Qtc (likely from haldol and/or hydroxyzine). She was extubated today and psychiatry was consulted; qtc has since normalized. We will resume a lower dose of haldol this evening (will likely inc to full dose tomorrow) and pursue inpt psychiatric placement when medically stable given severity of OD and underlying depressive symptoms.   Diagnoses:  Active Hospital problems: Principal Problem:   Suicidal overdose ([place]) Active Problems:   Intentional overdose ([place])    Problems  edited/added by me: No problems updated.  Plan  ## Safety and Observation Level:  - Based on my clinical evaluation, I estimate the patient to be at high risk of self harm in the current setting - At this time, we recommend a 1:1 level of observation. This decision is based on my review of the chart including patient's history and current presentation, interview of the patient, mental status examination, and consideration of suicide risk including evaluating suicidal ideation, plan, intent, suicidal or self-harm behaviors, risk factors, and protective factors. This judgment is based on our ability to directly address suicide risk, implement suicide prevention strategies and develop a safety plan while the patient is in the clinical setting. Please contact our team if there is a concern that risk level has changed.   ## Medications:  -- start haldol 5 mg Hold other home meds   ## Medical Decision Making Capacity:  Not formally assesssed  ## Further Work-up:  -- tsh wnl -- no vitamin B12, folate recorded - will rec tomorrow  ## Disposition:  -- to inpt psych when medically stable  ##Legal Status -- currently voluntary, will IVC if tries to leave  Thank you for this consult request. Recommendations have been communicated to the primary team.  We will continue to follow at this time.   Glorieta A Marva Hendryx    NEW  history  Relevant Aspects of Hospital Course:  See assessment. Pt extubated this AM.   From ED triage note:   From ED traige note Omezprazole 44m x7, Vit D 10074mx7, Prazosin 41m42m7, Simvastatin 60m86m Lithium 450mg2m Amlodipine 41mg x39mHydroxizine 50 mg x7, Benztropine 1mg x749mrimidone 50mg x167mivalproex  581m x11, Haldol 19mx13.   Unlikely that she Od'd on lithium or depakote given low levels on serial labs.   Boyfriend tried to visit while pt intubated, barred by pt mom.   [facility]  [facility], midazolam)  Patient Report:  Pt seen shortly after consult order placed; discussed with bedside RN - pt alert and oriented to location, situation, month and year. She is hoarse but able to communicate  Patient states that "my mom won't let me into her house". Patient states that she wanted to see her mom and her mom's puppy, but her mom wouldn't let her in to see the puppy. She chose to attempt suicide because she didn't get to see the puppy. She overdosed on Haldol and "all of her medicine". She saw her ACT team "yesterday" (referencing Saturday) [facility]. She was otherwise oriented to month and year. She didn't go to the urgent care (had been at urgent care prior to dc because "they don't like me".   Discussed likely plan for inpatient psychiatry. She asks if this means [facility] - pt expresses frustration that they don't help her or change medications. Discussed role of ACT team in care - pt reports that she has not been seeing ACT team as often recently because she has been hanging out downtown. She thinks her meds need to be changed because she is wants "OCD medication" and otherwise feels that her meds are not working for her.   Pt then takes off wig, had shaved head recently because of "OCD".    Patient reports that she saw the ACTT in [facility] (will need to verify this). Patient reports that she called the ambulance herself after her SA because "I didn't feel good." Patient reports that she took the pills because she wanted to die, and still wanted to die when she called the ambulance. Patient reports that she waited "for a minute" before she called them (unlikely given timeline). Patient reports that her boyfriend and her mom know that she is in the hospital. Patient consented to her mother being contacted by medical  team. Patient reports that she believes she "needs help."    ROS:  Limited due to hoarseness, pt fatigue  Collateral information:  Spoke to mother - [name] says that she last talked to Ti[place]en Friday afternoon. She seemed like she was doing OK and did not seem to be in distress. She mentioned that her boyfriend was stressing her out. She says that she has kids (has 3 daughters, oldest is in high school) to live for and she wasn't going to "do this" no more. Daughters stay in foster care and have been in foster care for about 7-8 years. Mom is frustrated that Sheng had access to her medication. Mom does have a puppy - has not been barring access to [name] knows when her boyfriend makes him mad she goes "off", getting upset over little things, has a bad temper. She has had numerous suicide attempts but this is the second time she has needed the tube down her throat. Mom is concerned that she had access to a week's worth of medications at one time.   Psychiatric History:  Information collected from pt, medical record  Family psych history: deferred  Medical  History: Past Medical History:  Diagnosis Date   Anxiety    Bipolar 1 disorder ([place])    Cognitive deficits    Depression    Diabetes mellitus without complication ([place])    Hypertension    Mental disorder    Mental health disorder    Obesity     Surgical History: Past Surgical History:  Procedure Laterality Date   CESAREAN SECTION     CESAREAN SECTION N/A 04/25/2013   Procedure: REPEAT CESAREAN SECTION;  Surgeon: [name], MD;  Location: [facility];  Service: Obstetrics;  Laterality: N/A;   MASS EXCISION N/A 06/03/2012   Procedure: EXCISION MASS;  Surgeon: [name], MD;  Location: [facility];  Service: ENT;  Laterality: N/A;  Excision uvula mass   TONSILLECTOMY N/A 06/03/2012   Procedure: TONSILLECTOMY;  Surgeon: [name], MD;  Location: [facility];  Service: ENT;   Laterality: N/A;   TONSILLECTOMY      Medications:   Current Facility-Administered Medications:    chlorhexidine gluconate (MEDLINE KIT) (PERIDEX) 0.12 % solution 15 mL, 15 mL, Mouth Rinse, BID, Simpson, Paula B, NP, 15 mL at 03/25/21 0800   Chlorhexidine Gluconate Cloth 2 % PADS 6 each, 6 each, Topical, Daily, Simonne Maffucci B, MD, 6 each at 03/25/21 1116   haloperidol lactate (HALDOL) injection 5 mg, 5 mg, Intravenous, QHS, Hunsucker, Bonna Gains, MD   heparin injection 5,000 Units, 5,000 Units, Subcutaneous, Q8H, Simpson, Paula B, NP, 5,000 Units at 03/25/21 1326   insulin aspart (novoLOG) injection 0-15 Units, 0-15 Units, Subcutaneous, Q4H, Hunsucker, Bonna Gains, MD, 3 Units at 03/25/21 1651   insulin detemir (LEVEMIR) injection 40 Units, 40 Units, Subcutaneous, BID, Hunsucker, Bonna Gains, MD, 40 Units at 03/25/21 0954   MEDLINE mouth rinse, 15 mL, Mouth Rinse, 10 times per day, Jennelle Human B, NP, 15 mL at 03/25/21 1655  Allergies: Allergies  Allergen Reactions   Wellbutrin [Bupropion] Shortness Of Breath   Omnipaque [Iohexol] Swelling and Other (See Comments)    Eye swelling   Contrast Media [Iodinated Diagnostic Agents]     Eyes swell up   Depakote [Divalproex Sodium]     Mouth sores. dizziness   Hydroxyzine     Causes hyperactivity, makes pt want to "fight"   Penicillin G Hives    Social History:  Lives alone  Tobacco use: daily Alcohol use: has denied in past Drug use: ?UDS + barbiturates  Family History:  The patient's family history includes Diabetes in her father; Hypertension in her mother.    Objective  Vital signs:  Temp:  [98.1 F (36.7 C)-100.9 F (38.3 C)] 98.1 F (36.7 C) (11/29 1523) Pulse Rate:  [75-90] 83 (11/29 1400) Resp:  [17-28] 17 (11/29 1400) BP: (124-151)/(75-117) 124/80 (11/29 1400) SpO2:  [95 %-100 %] 99 % (11/29 1400) FiO2 (%):  [40 %] 40 % (11/29 0915) Weight:  [109.4 kg] 109.4 kg (11/29 0500)  Physical Exam: Gen: Lying in bed,  ill appearing Pulm: no increased WOB Neuro: CNII-XII grossly intact, hoarse Psych: Largely oriented (date wrong, just extubated)  Mental Status Exam: Relatively brief exam 2/2 fatigue, difficulty talking Appearance: Lying in hospital bed  Attitude:  Cooperative, good effort  Behavior/Psychomotor: Somewhat decreased, wnl considering recent extubation  Speech/Language:  Notable hoarseness, mild delay, short sentences  Mood: depressed  Affect: Congruent   Thought process: Logical, goal directed, mild paucity although clearly fatigued by end of conversation  Thought content:   Denied active SI, HI, no overt delusional content  Perceptual disturbances:  Denied AH/VH, not overtly RIS   Attention: good  Concentration: good  Orientation: Deficits to date only - oriented to self, situation, location, month/year  Memory: Fairly good  Fund of knowledge:  Not formally assessed  Insight:   poor  Judgment:  poor  Impulse Control: Very poor

## 2021-03-25 NOTE — Consult Note (Incomplete)
[facility] Consult   Reason for Consult:  *** Referring Physician:  *** Patient Identification: [name] MRN:  [id number] Principal Diagnosis: Suicidal overdose ([place]) Diagnosis:  Principal Problem:   Suicidal overdose ([place])   Total Time spent with patient: {Time; 15 min - 8 hours:17441}  Subjective:   [name] is a 30 y.o. female patient admitted with ***.  HPI:    Regimen 11/2020 -Cogentin 1mg  BID - Depakote ER 1000mg  BID - Haloperidol 10mg  in AM and 20mg  QHS - Haloperidol deconate 150mg  q 28days - Vistaril 50mg  QHS PRN - Prazosin 5 mg QHS  Past Psychiatric History: ***  Risk to Self:   Risk to Others:   Prior Inpatient Therapy:   Prior Outpatient Therapy:    Past Medical History:  Past Medical History:  Diagnosis Date   Anxiety    Bipolar 1 disorder ([place])    Cognitive deficits    Depression    Diabetes mellitus without complication ([place])    Hypertension    Mental disorder    Mental health disorder    Obesity     Past Surgical History:  Procedure Laterality Date   CESAREAN SECTION     CESAREAN SECTION N/A 04/25/2013   Procedure: REPEAT CESAREAN SECTION;  Surgeon: [name], MD;  Location: [facility];  Service: Obstetrics;  Laterality: N/A;   MASS EXCISION N/A 06/03/2012   Procedure: EXCISION MASS;  Surgeon: [name], MD;  Location: [facility];  Service: ENT;  Laterality: N/A;  Excision uvula mass   TONSILLECTOMY N/A 06/03/2012   Procedure: TONSILLECTOMY;  Surgeon: [name], MD;  Location: [facility];  Service: ENT;  Laterality: N/A;   TONSILLECTOMY     Family History:  Family History  Problem Relation Age of Onset   Hypertension Mother    Diabetes Father    Family Psychiatric  History: *** Social History:  Social History   Substance and Sexual Activity  Alcohol Use Not Currently     Social History   Substance and Sexual Activity  Drug Use Not Currently   Types: "Crack"  cocaine, Other-see comments   Comment: Patient reports hx of smoking Crack    Social History   Socioeconomic History   Marital status: Single    Spouse name: Not on file   Number of children: Not on file   Years of education: Not on file   Highest education level: Not on file  Occupational History   Not on file  Tobacco Use   Smoking status: Every Day    Types: Cigars   Smokeless tobacco: Never   Tobacco comments:    Pt declined  Vaping Use   Vaping Use: Never used  Substance and Sexual Activity   Alcohol use: Not Currently   Drug use: Not Currently    Types: "Crack" cocaine, Other-see comments    Comment: Patient reports hx of smoking Crack   Sexual activity: Not Currently    Birth control/protection: None    Comment: occasionally  Other Topics Concern   Not on file  Social History Narrative   ** Merged History Encounter **       Social Determinants of Health   Financial Resource Strain: Not on file  Food Insecurity: Not on file  Transportation Needs: Not on file  Physical Activity: Not on file  Stress: Not on file  Social Connections: Not on file   Additional Social History:    Allergies:   Allergies  Allergen Reactions  Wellbutrin [Bupropion] Shortness Of Breath   Omnipaque [Iohexol] Swelling and Other (See Comments)    Eye swelling   Contrast Media [Iodinated Diagnostic Agents]     Eyes swell up   Depakote [Divalproex Sodium]     Mouth sores. dizziness   Hydroxyzine     Causes hyperactivity, makes pt want to "fight"   Penicillin G Hives    Labs:  Results for orders placed or performed during the hospital encounter of 03/23/21 (from the past 48 hour(s))  Resp Panel by RT-PCR (Flu A&B, Covid) Nasopharyngeal Swab     Status: None   Collection Time: 03/23/21  2:11 PM   Specimen: Nasopharyngeal Swab; Nasopharyngeal(NP) swabs in vial transport medium  Result Value Ref Range   SARS Coronavirus 2 by RT PCR NEGATIVE NEGATIVE    Comment:  (NOTE) SARS-CoV-2 target nucleic acids are NOT DETECTED.  The SARS-CoV-2 RNA is generally detectable in upper respiratory specimens during the acute phase of infection. The lowest concentration of SARS-CoV-2 viral copies this assay can detect is 138 copies/mL. A negative result does not preclude SARS-Cov-2 infection and should not be used as the sole basis for treatment or other patient management decisions. A negative result may occur with  improper specimen collection/handling, submission of specimen other than nasopharyngeal swab, presence of viral mutation(s) within the areas targeted by this assay, and inadequate number of viral copies(<138 copies/mL). A negative result must be combined with clinical observations, patient history, and epidemiological information. The expected result is Negative.  Fact Sheet for Patients:  EntrepreneurPulse.com.au  Fact Sheet for Healthcare Providers:  IncredibleEmployment.be  This test is no t yet approved or cleared by the [place] FDA and  has been authorized for detection and/or diagnosis of SARS-CoV-2 by FDA under an Emergency Use Authorization (EUA). This EUA will remain  in effect (meaning this test can be used) for the duration of the COVID-19 declaration under Section 564(b)(1) of the Act, 21 U.S.C.section 360bbb-3(b)(1), unless the authorization is terminated  or revoked sooner.       Influenza A by PCR NEGATIVE NEGATIVE   Influenza B by PCR NEGATIVE NEGATIVE    Comment: (NOTE) The Xpert Xpress SARS-CoV-2/FLU/RSV plus assay is intended as an aid in the diagnosis of influenza from Nasopharyngeal swab specimens and should not be used as a sole basis for treatment. Nasal washings and aspirates are unacceptable for Xpert Xpress SARS-CoV-2/FLU/RSV testing.  Fact Sheet for Patients: EntrepreneurPulse.com.au  Fact Sheet for Healthcare  Providers: IncredibleEmployment.be  This test is not yet approved or cleared by the [place] FDA and has been authorized for detection and/or diagnosis of SARS-CoV-2 by FDA under an Emergency Use Authorization (EUA). This EUA will remain in effect (meaning this test can be used) for the duration of the COVID-19 declaration under Section 564(b)(1) of the Act, 21 U.S.C. section 360bbb-3(b)(1), unless the authorization is terminated or revoked.  Performed at [facility], [place] [address] [place], [place] [address]   Acetaminophen level     Status: Abnormal   Collection Time: 03/23/21  2:11 PM  Result Value Ref Range   Acetaminophen (Tylenol), Serum <10 (L) 10 - 30 ug/mL    Comment: (NOTE) Therapeutic concentrations vary significantly. A range of 10-30 ug/mL  may be an effective concentration for many patients. However, some  are best treated at concentrations outside of this range. Acetaminophen concentrations >150 ug/mL at 4 hours after ingestion  and >50 ug/mL at 12 hours after ingestion are often associated with  toxic reactions.  Performed at [facility], [place] [address] [place], [place] [address]   Salicylate level     Status: Abnormal   Collection Time: 03/23/21  2:11 PM  Result Value Ref Range   Salicylate Lvl <4.9 (L) 7.0 - 30.0 mg/dL    Comment: Performed at [facility] [address] [place], [place] [address]  TSH     Status: None   Collection Time: 03/23/21  2:12 PM  Result Value Ref Range   TSH 2.087 0.350 - 4.500 uIU/mL    Comment: Performed by a 3rd Generation assay with a functional sensitivity of <=0.01 uIU/mL. Performed at [facility], [place] [address], [place], [place] [address]   Lithium level     Status: Abnormal   Collection Time: 03/23/21  2:12 PM  Result Value Ref Range   Lithium Lvl 0.30 (L) 0.60 - 1.20 mmol/L    Comment: Performed at [facility] [address], [place], [place] [address]   Magnesium     Status: None   Collection Time: 03/23/21  2:18 PM  Result Value Ref Range   Magnesium 1.8 1.7 - 2.4 mg/dL    Comment: Performed at [facility] [address], [place], [place] [address]  Valproic acid level     Status: Abnormal   Collection Time: 03/23/21  2:18 PM  Result Value Ref Range   Valproic Acid Lvl <10 (L) 50.0 - 100.0 ug/mL    Comment: RESULTS CONFIRMED BY MANUAL DILUTION Performed at [facility] [address] [place], [place] [address]   I-Stat beta hCG blood, ED     Status: None   Collection Time: 03/23/21  2:49 PM  Result Value Ref Range   I-stat hCG, quantitative <5.0 <5 mIU/mL   Comment 3            Comment:   GEST. AGE      CONC.  (mIU/mL)   <=1 WEEK        5 - 50     2 WEEKS       50 - 500     3 WEEKS       100 - 10,000     4 WEEKS     1,000 - 30,000        FEMALE AND NON-PREGNANT FEMALE:     LESS THAN 5 mIU/mL   I-stat chem 8, ED (not at [facility] or [facility])     Status: Abnormal   Collection Time: 03/23/21  2:51 PM  Result Value Ref Range   Sodium 138 135 - 145 mmol/L   Potassium 3.8 3.5 - 5.1 mmol/L   Chloride 106 98 - 111 mmol/L   BUN 5 (L) 6 - 20 mg/dL   Creatinine, Ser 0.60 0.44 - 1.00 mg/dL   Glucose, Bld 275 (H) 70 - 99 mg/dL    Comment: Glucose reference range applies only to samples taken after fasting for at least 8 hours.   Calcium, Ion 1.00 (L) 1.15 - 1.40 mmol/L   TCO2 24 22 - 32 mmol/L   Hemoglobin 12.9 12.0 - 15.0 g/dL   HCT 38.0 36.0 - 46.0 %  CBC     Status: Abnormal   Collection Time: 03/23/21  3:06 PM  Result Value Ref Range   WBC 9.6 4.0 - 10.5 K/uL   RBC 4.40 3.87 - 5.11 MIL/uL   Hemoglobin 11.4 (L) 12.0 - 15.0 g/dL   HCT  36.6 36.0 - 46.0 %   MCV 83.2 80.0 - 100.0 fL   MCH 25.9 (L) 26.0 - 34.0 pg   MCHC 31.1 30.0 - 36.0 g/dL   RDW 15.6 (H) 11.5 - 15.5 %   Platelets 216 150 - 400 K/uL   nRBC 0.0 0.0 - 0.2 %    Comment: Performed at [facility] [address], [place], [place] [address]   Comprehensive metabolic panel     Status: Abnormal   Collection Time: 03/23/21  3:06 PM  Result Value Ref Range   Sodium 137 135 - 145 mmol/L   Potassium 3.5 3.5 - 5.1 mmol/L   Chloride 105 98 - 111 mmol/L   CO2 23 22 - 32 mmol/L   Glucose, Bld 258 (H) 70 - 99 mg/dL    Comment: Glucose reference range applies only to samples taken after fasting for at least 8 hours.   BUN 5 (L) 6 - 20 mg/dL   Creatinine, Ser 0.75 0.44 - 1.00 mg/dL   Calcium 8.9 8.9 - 10.3 mg/dL   Total Protein 6.7 6.5 - 8.1 g/dL   Albumin 3.7 3.5 - 5.0 g/dL   AST 26 15 - 41 U/L   ALT 21 0 - 44 U/L   Alkaline Phosphatase 63 38 - 126 U/L   Total Bilirubin 0.6 0.3 - 1.2 mg/dL   GFR, Estimated >60 >60 mL/min    Comment: (NOTE) Calculated using the CKD-EPI Creatinine Equation (2021)    Anion gap 9 5 - 15    Comment: Performed at [facility] [address] [place], [place] [address]  I-Stat arterial blood gas, ED     Status: Abnormal   Collection Time: 03/23/21  4:23 PM  Result Value Ref Range   pH, Arterial 7.319 (L) 7.350 - 7.450   pCO2 arterial 47.8 32.0 - 48.0 mmHg   pO2, Arterial 87 83.0 - 108.0 mmHg   Bicarbonate 24.6 20.0 - 28.0 mmol/L   TCO2 26 22 - 32 mmol/L   O2 Saturation 96.0 %   Acid-base deficit 2.0 0.0 - 2.0 mmol/L   Sodium 141 135 - 145 mmol/L   Potassium 2.7 (LL) 3.5 - 5.1 mmol/L   Calcium, Ion 1.30 1.15 - 1.40 mmol/L   HCT 37.0 36.0 - 46.0 %   Hemoglobin 12.6 12.0 - 15.0 g/dL   Patient temperature 98.4 F    Collection site RADIAL, ALLEN'S TEST ACCEPTABLE    Drawn by Operator    Sample type ARTERIAL    Comment NOTIFIED PHYSICIAN   Glucose, capillary     Status: Abnormal   Collection Time: 03/23/21  4:40 PM  Result Value Ref Range   Glucose-Capillary 274 (H) 70 - 99 mg/dL    Comment: Glucose reference range applies only to samples taken after fasting for at least 8 hours.  Urine rapid drug screen (hosp performed)     Status: Abnormal   Collection Time: 03/23/21  5:14 PM  Result  Value Ref Range   Opiates NONE DETECTED NONE DETECTED   Cocaine NONE DETECTED NONE DETECTED   Benzodiazepines POSITIVE (A) NONE DETECTED   Amphetamines NONE DETECTED NONE DETECTED   Tetrahydrocannabinol NONE DETECTED NONE DETECTED   Barbiturates POSITIVE (A) NONE DETECTED    Comment: (NOTE) DRUG SCREEN FOR MEDICAL PURPOSES ONLY.  IF CONFIRMATION IS NEEDED FOR ANY PURPOSE, NOTIFY LAB WITHIN 5 DAYS.  LOWEST DETECTABLE LIMITS FOR URINE DRUG SCREEN Drug Class  Cutoff (ng/mL) Amphetamine and metabolites    1000 Barbiturate and metabolites    200 Benzodiazepine                 166 Tricyclics and metabolites     300 Opiates and metabolites        300 Cocaine and metabolites        300 THC                            50 Performed at [facility], [place] [address], [place], [place] [address]   Urinalysis, Routine w reflex microscopic Nasal Mucosa     Status: Abnormal   Collection Time: 03/23/21  5:14 PM  Result Value Ref Range   Color, Urine YELLOW YELLOW   APPearance CLEAR CLEAR   Specific Gravity, Urine 1.035 (H) 1.005 - 1.030   pH 5.0 5.0 - 8.0   Glucose, UA >=500 (A) NEGATIVE mg/dL   Hgb urine dipstick NEGATIVE NEGATIVE   Bilirubin Urine NEGATIVE NEGATIVE   Ketones, ur 5 (A) NEGATIVE mg/dL   Protein, ur NEGATIVE NEGATIVE mg/dL   Nitrite NEGATIVE NEGATIVE   Leukocytes,Ua NEGATIVE NEGATIVE   RBC / HPF 0-5 0 - 5 RBC/hpf   WBC, UA 0-5 0 - 5 WBC/hpf   Bacteria, UA NONE SEEN NONE SEEN   Squamous Epithelial / LPF 0-5 0 - 5   Mucus PRESENT     Comment: Performed at [facility], [place] [address], [place], [place] [address]  MRSA Next Gen by PCR, Nasal     Status: None   Collection Time: 03/23/21  5:23 PM   Specimen: Nasal Mucosa; Nasal Swab  Result Value Ref Range   MRSA by PCR Next Gen NOT DETECTED NOT DETECTED    Comment: (NOTE) The GeneXpert MRSA Assay (FDA approved for NASAL specimens only), is one component of a comprehensive MRSA colonization  surveillance program. It is not intended to diagnose MRSA infection nor to guide or monitor treatment for MRSA infections. Test performance is not FDA approved in patients less than 55 years old. Performed at [facility], [place] [address] [place], [place] [address]   Ethanol     Status: None   Collection Time: 03/23/21  6:36 PM  Result Value Ref Range   Alcohol, Ethyl (B) <10 <10 mg/dL    Comment: (NOTE) Lowest detectable limit for serum alcohol is 10 mg/dL.  For medical purposes only. Performed at [facility], [place] [address], [place], [place] [address]   Glucose, capillary     Status: Abnormal   Collection Time: 03/23/21  7:42 PM  Result Value Ref Range   Glucose-Capillary 352 (H) 70 - 99 mg/dL    Comment: Glucose reference range applies only to samples taken after fasting for at least 8 hours.  Magnesium     Status: None   Collection Time: 03/23/21  9:55 PM  Result Value Ref Range   Magnesium 2.2 1.7 - 2.4 mg/dL    Comment: Performed at [facility], [place] [address], [place], [place] [address]  Basic metabolic panel     Status: Abnormal   Collection Time: 03/23/21  9:55 PM  Result Value Ref Range   Sodium 137 135 - 145 mmol/L   Potassium 4.1 3.5 - 5.1 mmol/L    Comment: DELTA CHECK NOTED   Chloride 103 98 - 111 mmol/L   CO2 24 22 - 32  mmol/L   Glucose, Bld 314 (H) 70 - 99 mg/dL    Comment: Glucose reference range applies only to samples taken after fasting for at least 8 hours.   BUN <5 (L) 6 - 20 mg/dL   Creatinine, Ser 0.82 0.44 - 1.00 mg/dL   Calcium 9.1 8.9 - 10.3 mg/dL   GFR, Estimated >60 >60 mL/min    Comment: (NOTE) Calculated using the CKD-EPI Creatinine Equation (2021)    Anion gap 10 5 - 15    Comment: Performed at [facility] [address], [place], [place] [address]  Glucose, capillary     Status: Abnormal   Collection Time: 03/23/21 11:33 PM  Result Value Ref Range   Glucose-Capillary 273 (H) 70 - 99 mg/dL    Comment:  Glucose reference range applies only to samples taken after fasting for at least 8 hours.  Acetaminophen level     Status: Abnormal   Collection Time: 03/23/21 11:38 PM  Result Value Ref Range   Acetaminophen (Tylenol), Serum <10 (L) 10 - 30 ug/mL    Comment: (NOTE) Therapeutic concentrations vary significantly. A range of 10-30 ug/mL  may be an effective concentration for many patients. However, some  are best treated at concentrations outside of this range. Acetaminophen concentrations >150 ug/mL at 4 hours after ingestion  and >50 ug/mL at 12 hours after ingestion are often associated with  toxic reactions.  Performed at [facility], [place] [address], [place], [place] [address]   Valproic acid level     Status: Abnormal   Collection Time: 03/23/21 11:39 PM  Result Value Ref Range   Valproic Acid Lvl <10 (L) 50.0 - 100.0 ug/mL    Comment: RESULTS CONFIRMED BY MANUAL DILUTION Performed at [facility] [address] [place] [address], [place], [place] [address]   Lithium level     Status: Abnormal   Collection Time: 03/23/21 11:40 PM  Result Value Ref Range   Lithium Lvl 0.13 (L) 0.60 - 1.20 mmol/L    Comment: Performed at [facility] [address], [place], [place] [address]  CBC     Status: Abnormal   Collection Time: 03/24/21 12:46 AM  Result Value Ref Range   WBC 13.4 (H) 4.0 - 10.5 K/uL   RBC 4.24 3.87 - 5.11 MIL/uL   Hemoglobin 11.0 (L) 12.0 - 15.0 g/dL   HCT 33.9 (L) 36.0 - 46.0 %   MCV 80.0 80.0 - 100.0 fL   MCH 25.9 (L) 26.0 - 34.0 pg   MCHC 32.4 30.0 - 36.0 g/dL   RDW 15.5 11.5 - 15.5 %   Platelets 275 150 - 400 K/uL   nRBC 0.0 0.0 - 0.2 %    Comment: Performed at [facility], [place] [address] [place], [place] [address]  Basic metabolic panel     Status: Abnormal   Collection Time: 03/24/21 12:46 AM  Result Value Ref Range   Sodium 137 135 - 145 mmol/L   Potassium 3.9 3.5 - 5.1 mmol/L   Chloride 105 98 - 111 mmol/L   CO2 20 (L) 22 - 32 mmol/L    Glucose, Bld 283 (H) 70 - 99 mg/dL    Comment: Glucose reference range applies only to samples taken after fasting for at least 8 hours.   BUN 6 6 - 20 mg/dL   Creatinine, Ser 0.85 0.44 - 1.00 mg/dL   Calcium 9.0 8.9 - 10.3 mg/dL   GFR, Estimated >60 >60 mL/min  Comment: (NOTE) Calculated using the CKD-EPI Creatinine Equation (2021)    Anion gap 12 5 - 15    Comment: Performed at [facility], [place] [address], [place], [place] [address]  Triglycerides     Status: Abnormal   Collection Time: 03/24/21 12:48 AM  Result Value Ref Range   Triglycerides 429 (H) <150 mg/dL    Comment: Performed at [facility] [address], [place], [place] [address]  Hemoglobin A1c     Status: Abnormal   Collection Time: 03/24/21 12:48 AM  Result Value Ref Range   Hgb A1c MFr Bld 8.9 (H) 4.8 - 5.6 %    Comment: (NOTE)         Prediabetes: 5.7 - 6.4         Diabetes: >6.4         Glycemic control for adults with diabetes: <7.0    Mean Plasma Glucose 209 mg/dL    Comment: (NOTE) Performed At: [facility] [place], [place] [id number] Rush Farmer MD [id number]   Ammonia     Status: Abnormal   Collection Time: 03/24/21  1:18 AM  Result Value Ref Range   Ammonia 43 (H) 9 - 35 umol/L    Comment: Performed at [facility], [place] [address], [place], [place] [address]  Glucose, capillary     Status: Abnormal   Collection Time: 03/24/21  3:23 AM  Result Value Ref Range   Glucose-Capillary 255 (H) 70 - 99 mg/dL    Comment: Glucose reference range applies only to samples taken after fasting for at least 8 hours.  I-STAT 7, (LYTES, BLD GAS, ICA, H+H)     Status: Abnormal   Collection Time: 03/24/21  3:52 AM  Result Value Ref Range   pH, Arterial 7.482 (H) 7.350 - 7.450   pCO2 arterial 34.3 32.0 - 48.0 mmHg   pO2, Arterial 356 (H) 83.0 - 108.0 mmHg   Bicarbonate 25.7 20.0 - 28.0 mmol/L   TCO2 27 22 - 32 mmol/L   O2 Saturation 100.0 %   Acid-Base Excess  2.0 0.0 - 2.0 mmol/L   Sodium 140 135 - 145 mmol/L   Potassium 3.6 3.5 - 5.1 mmol/L   Calcium, Ion 1.22 1.15 - 1.40 mmol/L   HCT 33.0 (L) 36.0 - 46.0 %   Hemoglobin 11.2 (L) 12.0 - 15.0 g/dL   Patient temperature 37.0 C    Collection site art line    Drawn by Operator    Sample type ARTERIAL   Glucose, capillary     Status: Abnormal   Collection Time: 03/24/21  7:31 AM  Result Value Ref Range   Glucose-Capillary 181 (H) 70 - 99 mg/dL    Comment: Glucose reference range applies only to samples taken after fasting for at least 8 hours.  Glucose, capillary     Status: Abnormal   Collection Time: 03/24/21 11:42 AM  Result Value Ref Range   Glucose-Capillary 208 (H) 70 - 99 mg/dL    Comment: Glucose reference range applies only to samples taken after fasting for at least 8 hours.  Glucose, capillary     Status: Abnormal   Collection Time: 03/24/21  3:51 PM  Result Value Ref Range   Glucose-Capillary 257 (H) 70 - 99 mg/dL    Comment: Glucose reference range applies only to samples taken after fasting for at least 8 hours.  Magnesium     Status: None   Collection Time: 03/24/21  5:10  PM  Result Value Ref Range   Magnesium 2.1 1.7 - 2.4 mg/dL    Comment: Performed at [facility], [place] [address] [place], [place] [address]  Phosphorus     Status: None   Collection Time: 03/24/21  5:10 PM  Result Value Ref Range   Phosphorus 2.5 2.5 - 4.6 mg/dL    Comment: Performed at [facility] [address] [place], [place] [address]  Glucose, capillary     Status: Abnormal   Collection Time: 03/24/21  7:55 PM  Result Value Ref Range   Glucose-Capillary 258 (H) 70 - 99 mg/dL    Comment: Glucose reference range applies only to samples taken after fasting for at least 8 hours.  Glucose, capillary     Status: Abnormal   Collection Time: 03/24/21 11:56 PM  Result Value Ref Range   Glucose-Capillary 304 (H) 70 - 99 mg/dL    Comment: Glucose reference range applies only to samples  taken after fasting for at least 8 hours.  Magnesium     Status: None   Collection Time: 03/25/21  1:01 AM  Result Value Ref Range   Magnesium 1.9 1.7 - 2.4 mg/dL    Comment: Performed at [facility] [address], [place], [place] [address]  Phosphorus     Status: None   Collection Time: 03/25/21  1:01 AM  Result Value Ref Range   Phosphorus 3.4 2.5 - 4.6 mg/dL    Comment: Performed at [facility] [address], [place], [place] [address]  CBC with Differential/Platelet     Status: Abnormal   Collection Time: 03/25/21  1:01 AM  Result Value Ref Range   WBC 13.1 (H) 4.0 - 10.5 K/uL   RBC 4.33 3.87 - 5.11 MIL/uL   Hemoglobin 11.2 (L) 12.0 - 15.0 g/dL   HCT 35.1 (L) 36.0 - 46.0 %   MCV 81.1 80.0 - 100.0 fL   MCH 25.9 (L) 26.0 - 34.0 pg   MCHC 31.9 30.0 - 36.0 g/dL   RDW 15.3 11.5 - 15.5 %   Platelets 219 150 - 400 K/uL    Comment: REPEATED TO VERIFY   nRBC 0.0 0.0 - 0.2 %   Neutrophils Relative % 68 %   Neutro Abs 8.7 (H) 1.7 - 7.7 K/uL   Lymphocytes Relative 23 %   Lymphs Abs 3.1 0.7 - 4.0 K/uL   Monocytes Relative 7 %   Monocytes Absolute 0.9 0.1 - 1.0 K/uL   Eosinophils Relative 2 %   Eosinophils Absolute 0.3 0.0 - 0.5 K/uL   Basophils Relative 0 %   Basophils Absolute 0.1 0.0 - 0.1 K/uL   Immature Granulocytes 0 %   Abs Immature Granulocytes 0.04 0.00 - 0.07 K/uL    Comment: Performed at [facility], [place] [address] [place], [place] [address]  Basic metabolic panel     Status: Abnormal   Collection Time: 03/25/21  1:01 AM  Result Value Ref Range   Sodium 137 135 - 145 mmol/L   Potassium 3.6 3.5 - 5.1 mmol/L   Chloride 106 98 - 111 mmol/L   CO2 22 22 - 32 mmol/L   Glucose, Bld 311 (H) 70 - 99 mg/dL    Comment: Glucose reference range applies only to samples taken after fasting for at least 8 hours.   BUN 13 6 - 20 mg/dL   Creatinine, Ser 0.92 0.44 - 1.00 mg/dL   Calcium 8.8 (L) 8.9 -  10.3 mg/dL   GFR, Estimated >60 >60 mL/min    Comment:  (NOTE) Calculated using the CKD-EPI Creatinine Equation (2021)    Anion gap 9 5 - 15    Comment: Performed at [facility], [place] [address], [place], [place] [address]  Glucose, capillary     Status: Abnormal   Collection Time: 03/25/21  3:29 AM  Result Value Ref Range   Glucose-Capillary 314 (H) 70 - 99 mg/dL    Comment: Glucose reference range applies only to samples taken after fasting for at least 8 hours.  Glucose, capillary     Status: Abnormal   Collection Time: 03/25/21  7:43 AM  Result Value Ref Range   Glucose-Capillary 288 (H) 70 - 99 mg/dL    Comment: Glucose reference range applies only to samples taken after fasting for at least 8 hours.  Glucose, capillary     Status: Abnormal   Collection Time: 03/25/21 11:13 AM  Result Value Ref Range   Glucose-Capillary 290 (H) 70 - 99 mg/dL    Comment: Glucose reference range applies only to samples taken after fasting for at least 8 hours.    Current Facility-Administered Medications  Medication Dose Route Frequency Provider Last Rate Last Admin   chlorhexidine gluconate (MEDLINE KIT) (PERIDEX) 0.12 % solution 15 mL  15 mL Mouth Rinse BID [place] Human B, NP   15 mL at 03/25/21 0800   Chlorhexidine Gluconate Cloth 2 % PADS 6 each  6 each Topical Daily Juanito Doom, MD   6 each at 03/25/21 1116   heparin injection 5,000 Units  5,000 Units Subcutaneous Q8H Jennelle Human B, NP   5,000 Units at 03/25/21 0534   insulin aspart (novoLOG) injection 0-15 Units  0-15 Units Subcutaneous Q4H Hunsucker, Bonna Gains, MD   8 Units at 03/25/21 1115   insulin detemir (LEVEMIR) injection 40 Units  40 Units Subcutaneous BID Hunsucker, Bonna Gains, MD   40 Units at 03/25/21 0954   magnesium sulfate IVPB 2 g 50 mL  2 g Intravenous Once Hunsucker, Bonna Gains, MD 50 mL/hr at 03/25/21 1203 2 g at 03/25/21 1203   MEDLINE mouth rinse  15 mL Mouth Rinse 10 times per day Jennelle Human B, NP   15 mL at 03/25/21 1200    Musculoskeletal: Strength &  Muscle Tone: {desc; muscle tone:32375} Gait & Station: {PE GAIT ED VPXT:06269} Patient leans: {Patient Leans:21022755}            Psychiatric Specialty Exam:  Presentation  General Appearance: Appropriate for Environment (drowsy, extubated 78min prior to exam still on precedex)  Eye Contact:Fair  Speech:Slow (hoarse)  Speech Volume:Decreased  Handedness:Right   Mood and Affect  Mood:-- ("better")  Affect:Restricted   Thought Process  Thought Processes:Goal Directed  Descriptions of Associations:Circumstantial  Orientation:Full (Time, Place and Person)  Thought Content:Perseveration  History of Schizophrenia/Schizoaffective disorder:Yes  Duration of Psychotic Symptoms:N/A  Hallucinations:Hallucinations: None  Ideas of Reference:None  Suicidal Thoughts:Suicidal Thoughts: No  Homicidal Thoughts:Homicidal Thoughts: No   Sensorium  Memory:Immediate Fair; Recent Fair  Judgment:Impaired  Insight:None   Executive Functions  Concentration:Fair  Attention Span:Fair  [place] of Knowledge:Poor  Language:Fair   Psychomotor Activity  Psychomotor Activity:Psychomotor Activity: Decreased   Assets  Assets:Housing; Resilience; Financial Resources/Insurance   Sleep  Sleep:Sleep: Fair   Physical Exam: Physical Exam HENT:     Head: Normocephalic and atraumatic.  Pulmonary:     Effort: Pulmonary effort is normal.  Neurological:     Mental Status:  She is alert and oriented to person, place, and time.   Review of Systems  Psychiatric/Behavioral:  Negative for hallucinations and suicidal ideas.   Blood pressure 135/81, pulse 84, temperature 99.5 F (37.5 C), temperature source Oral, resp. rate (!) 28, weight 109.4 kg, SpO2 99 %. Body mass index is 37.77 kg/m.  Treatment Plan Summary: {CHL [facility] MD [place] HBZJ:696789381}  Disposition: {CHL [facility] Consult OFBP:10258}  Freida Busman, MD 03/25/2021 12:35 PM

## 2021-03-25 NOTE — Progress Notes (Addendum)
NAME:  [name], MRN:  [id number], DOB:  February 12, 1991, LOS: 2 ADMISSION DATE:  03/23/2021, CONSULTATION DATE:  03/23/2021 REFERRING MD:  Dr. Armandina Gemma, CHIEF COMPLAINT:  SI/ OD   History of Present Illness:  HPI obtained from medical chart review and per ER staff as patient is encephalopathic.   30 year old female with history of bipolar, schizophrenia, and DMT2 who presents by EMS to [facility] ER after apparently calling EMS after intentional overdose on her home pre-packaged medications.    She was just released from [facility] around 1254 after having issues with her family and boyfriend.  She had verbalized homicidal ideations against her boyfriend with plans to overdose on her medications and admitted 11/26.  She was also just discharged from [facility] 4-6 weeks ago.    Patient admitted to ER staff that she took 7 day and 3 evening packets from her pre-packaged medication packets and took them 10 mins prior to EMS arrival in which she reportedly called.  She arrived to ER at 1354.  Verbalized to ER staff that she hated life and wanted to die.  Meds in packets listed below.    Omezprazole 20mg  x7, Vit D 1000mg  x7, Prazosin 5mg  x7, Simvastatin 20mg x7, Lithium 450mg  x7, Amlodipine 5mg  x7, Hydroxizine 50 mg x7, Benztropine 1mg  x7, Primidone 50mg  x11, Divalproex 500mg  x11, Haldol 10mg  x13  In ER, afebrile, tachycardic 112, BP 147/93, normal pulse ox, and lethargic but able to answer questions on arrival.  Labs pending.  Noted to have prolonged Qtc on EKG.  Calcium gluconate and Mag ordered in ER however patient pulled out all her IV's, currently working to re-establish.  She has not been given activated charcoal yet due to mental status.  PCCM called for admit.   On arrival to ER, patient was electively intubated to facilitate treatment, protect her from further self harm (ripping out Ivs/ interference with medical therapies), and airway protection.    Pertinent  Medical History  Prior  Suicidal Ideations Bipolar I Schizophrenia MDD OCD PTSD DMT2 HLD Tobacco abuse Significant Hospital Events: Including procedures, antibiotic start and stop dates in addition to other pertinent events   11/27 admit with intentional OD/ SI attempt, Activated charcoal given, Poison control contacted. - acetaminophen, salicylate, valproic levels undetectable lithium ( low level 0.42, o.3. 0.13 on serial measurements) 11/28 doing ok, awakes to voice, stop prop, start precedex, Tfs ordered 11/29 decrease Precedex, transition to PS on vent. Plan to extubate  Interim History / Subjective:  Awake post extubation  Objective   Blood pressure (!) 141/83, pulse 85, temperature (!) 100.9 F (38.3 C), temperature source Axillary, resp. rate 20, weight 109.4 kg, SpO2 100 %.    Vent Mode: PRVC FiO2 (%):  [40 %] 40 % Set Rate:  [20 bmp] 20 bmp Vt Set:  [490 mL] 490 mL PEEP:  [8 cmH20] 8 cmH20 Plateau Pressure:  [20 cmH20-23 cmH20] 23 cmH20   Intake/Output Summary (Last 24 hours) at 03/25/2021 0755 Last data filed at 03/25/2021 0700 Gross per 24 hour  Intake 3041.72 ml  Output 510 ml  Net 2531.72 ml   Filed Weights   03/24/21 0147 03/25/21 0500  Weight: 118.8 kg 109.4 kg    Examination: General this is a 30 year old female she is now awake and oriented. Still a little sleepy but weaning precedex.  HENT NCAT no JVD MMM Pulm dec bases. Pulse ox 100% on Room air. Passed SBT prior to extubation  Card rrr Abd soft Neuro  awake and oriented.  Gu yellow urine    Resolved Hospital Problem list     Assessment & Plan:   Acute toxic metabolic encephalopathy s/p suicidal attempt via Intentional Overdose w/ prolonged QTC Hx Bipolar I , Schizophrenia, MDD  Now awake and following commands  Plan: Repeat 12 lead today Safety sitter Psych consult placed. Specifically for suicide/safety and recs re: meds.  May need IVC  Allow PO intake once more awake   Mild Leukocytosis w/ elevated  neutrophils - Febrile - Normotensive, NSR, Plan: Cont to monitor  If spikes temp would repeat CXR looking for aspiration   Acute Respiratory Insufficiency  Passed her SBT  Now extubated & on room air  Plan: NPO X 2 hrs then advance Cont pulse ox Mobilize   Hx HTN - Normotensive Plan: Holding home meds for now   Hyperglycemia, Hx DMII - elevated BG, can be d/t CCB OD, less likely d/t previously elevated BG and A1C. - A1C: 8.8 from 11/26 Plan: Ssi  Increased basal dosing    Best Practice (right click and "Reselect all SmartList Selections" daily)   Diet/type: NPO-->advance  DVT prophylaxis: SCD GI prophylaxis: PPI Lines: N/A Foley:  N/A Code Status:  full code Last date of multidisciplinary goals of care discussion [pending] ready to go to progressive.   My cct 75 min   Erick Colace ACNP-BC [place] Pager # [phone] OR # [phone] if no answer

## 2021-03-25 NOTE — Hospital Course (Signed)
ACTT regimen in 11/2020 -Cogentin 1mg  BID - Depakote ER 1000mg  BID - Haloperidol 10mg  in AM and 20mg  QHS - Haloperidol deconate 150mg  q 28days - Vistaril 50mg  QHS PRN - Prazosin 5 mg QHS

## 2021-03-26 DIAGNOSIS — T50902A Poisoning by unspecified drugs, medicaments and biological substances, intentional self-harm, initial encounter: Secondary | ICD-10-CM | POA: Diagnosis not present

## 2021-03-26 DIAGNOSIS — R9431 Abnormal electrocardiogram [ECG] [EKG]: Secondary | ICD-10-CM

## 2021-03-26 DIAGNOSIS — T50902S Poisoning by unspecified drugs, medicaments and biological substances, intentional self-harm, sequela: Secondary | ICD-10-CM | POA: Diagnosis not present

## 2021-03-26 LAB — CBC
HCT: 34.8 % — ABNORMAL LOW (ref 36.0–46.0)
Hemoglobin: 11.2 g/dL — ABNORMAL LOW (ref 12.0–15.0)
MCH: 26 pg (ref 26.0–34.0)
MCHC: 32.2 g/dL (ref 30.0–36.0)
MCV: 80.9 fL (ref 80.0–100.0)
Platelets: 145 10*3/uL — ABNORMAL LOW (ref 150–400)
RBC: 4.3 MIL/uL (ref 3.87–5.11)
RDW: 15.5 % (ref 11.5–15.5)
WBC: 10.7 10*3/uL — ABNORMAL HIGH (ref 4.0–10.5)
nRBC: 0 % (ref 0.0–0.2)

## 2021-03-26 LAB — GLUCOSE, CAPILLARY
Glucose-Capillary: 172 mg/dL — ABNORMAL HIGH (ref 70–99)
Glucose-Capillary: 179 mg/dL — ABNORMAL HIGH (ref 70–99)
Glucose-Capillary: 200 mg/dL — ABNORMAL HIGH (ref 70–99)
Glucose-Capillary: 190 mg/dL — ABNORMAL HIGH (ref 70–99)
Glucose-Capillary: 216 mg/dL — ABNORMAL HIGH (ref 70–99)

## 2021-03-26 LAB — BASIC METABOLIC PANEL
Anion gap: 10 (ref 5–15)
BUN: 9 mg/dL (ref 6–20)
CO2: 24 mmol/L (ref 22–32)
Calcium: 8.7 mg/dL — ABNORMAL LOW (ref 8.9–10.3)
Chloride: 105 mmol/L (ref 98–111)
Creatinine, Ser: 0.68 mg/dL (ref 0.44–1.00)
GFR, Estimated: >60 mL/min (ref >60)
Glucose, Bld: 185 mg/dL — ABNORMAL HIGH (ref 70–99)
Potassium: 3.6 mmol/L (ref 3.5–5.1)
Sodium: 139 mmol/L (ref 135–145)

## 2021-03-26 LAB — PHOSPHORUS: Phosphorus: 4.1 mg/dL (ref 2.5–4.6)

## 2021-03-26 LAB — MAGNESIUM: Magnesium: 1.8 mg/dL (ref 1.7–2.4)

## 2021-03-26 MED ORDER — INSULIN ASPART 100 UNIT/ML IJ SOLN
4.0000 [IU] | Freq: Three times a day (TID) | INTRAMUSCULAR | Status: DC
  Administered 2021-03-26 – 2021-03-28 (×6): 4 [IU] via SUBCUTANEOUS

## 2021-03-26 MED ORDER — PROMETHAZINE HCL 25 MG PO TABS
12.5000 mg | ORAL_TABLET | Freq: Four times a day (QID) | ORAL | Status: DC | PRN
  Administered 2021-03-27 (×2): 12.5 mg via ORAL
  Filled 2021-03-26 (×2): qty 1

## 2021-03-26 MED ORDER — GLUCERNA SHAKE PO LIQD
237.0000 mL | Freq: Three times a day (TID) | ORAL | Status: DC
  Administered 2021-03-27: 237 mL via ORAL

## 2021-03-26 MED ORDER — INSULIN ASPART 100 UNIT/ML IJ SOLN
0.0000 [IU] | Freq: Every day | INTRAMUSCULAR | Status: DC
  Administered 2021-03-26: 5 [IU] via SUBCUTANEOUS
  Administered 2021-03-27: 2 [IU] via SUBCUTANEOUS

## 2021-03-26 MED ORDER — PROMETHAZINE HCL 12.5 MG RE SUPP
12.5000 mg | Freq: Four times a day (QID) | RECTAL | Status: DC | PRN
  Filled 2021-03-26: qty 1

## 2021-03-26 MED ORDER — LORAZEPAM 2 MG/ML IJ SOLN
2.0000 mg | Freq: Four times a day (QID) | INTRAMUSCULAR | Status: DC | PRN
  Administered 2021-03-26 – 2021-03-28 (×4): 2 mg via INTRAVENOUS
  Filled 2021-03-26 (×4): qty 1

## 2021-03-26 MED ORDER — INSULIN ASPART 100 UNIT/ML IJ SOLN
0.0000 [IU] | Freq: Three times a day (TID) | INTRAMUSCULAR | Status: DC
  Administered 2021-03-26 – 2021-03-27 (×3): 3 [IU] via SUBCUTANEOUS
  Administered 2021-03-27: 5 [IU] via SUBCUTANEOUS
  Administered 2021-03-27 – 2021-03-28 (×3): 3 [IU] via SUBCUTANEOUS

## 2021-03-26 MED ORDER — SODIUM CHLORIDE 0.9 % IV SOLN
6.2500 mg | Freq: Four times a day (QID) | INTRAVENOUS | Status: DC | PRN
  Administered 2021-03-26: 6.25 mg via INTRAVENOUS
  Filled 2021-03-26 (×3): qty 0.25

## 2021-03-26 MED ORDER — POTASSIUM CHLORIDE 10 MEQ/100ML IV SOLN
10.0000 meq | INTRAVENOUS | Status: AC
  Administered 2021-03-26 (×5): 10 meq via INTRAVENOUS
  Filled 2021-03-26 (×5): qty 100

## 2021-03-26 MED ORDER — ADULT MULTIVITAMIN W/MINERALS CH
1.0000 | ORAL_TABLET | Freq: Every day | ORAL | Status: DC
  Administered 2021-03-26 – 2021-03-28 (×3): 1 via ORAL
  Filled 2021-03-26 (×3): qty 1

## 2021-03-26 MED ORDER — HALOPERIDOL 5 MG PO TABS
5.0000 mg | ORAL_TABLET | Freq: Every day | ORAL | Status: DC
  Administered 2021-03-26 – 2021-03-27 (×2): 5 mg via ORAL
  Filled 2021-03-26 (×4): qty 1

## 2021-03-26 MED ORDER — METOPROLOL TARTRATE 12.5 MG HALF TABLET
12.5000 mg | ORAL_TABLET | Freq: Two times a day (BID) | ORAL | Status: DC
  Administered 2021-03-26 – 2021-03-28 (×5): 12.5 mg via ORAL
  Filled 2021-03-26 (×5): qty 1

## 2021-03-26 NOTE — Progress Notes (Signed)
[facility] PROGRESS NOTE    Progress Note  Shannell Mikkelsen  CHY:[id number] DOB: 1990/05/13 DOA: 03/23/2021 PCP: Trey Sailors, PA     Brief Narrative:   [name] is an 30 y.o. female past medical history of bipolar disorder, schizophrenia diabetes mellitus type 2 recently discharged from [facility] 4 to 6 weeks ago was brought in by EMS for intentional overdose recently released from [facility] after family dispute with her family and boyfriend verbalized homicidal ideations to her boyfriend and ODD on 03/22/2021, she was intubated due to agitation   Assessment/Plan:   Acute toxic encephalopathy due to intentional drug overdose with a prolonged QTC in the setting of history of bipolar disorder schizophrenia major depressive disorder: Now awake, twelve-lead EKG on 11:30 am QTC of 425. Psych was consulted SIRS, they recommended Haldol inpatient psychiatric placement, given the severity of her OD and underlying depressive disorder. They also recommended one-to-one sitter.  Mild leukocytosis with a left shift: Afebrile normotensive in sinus rhythm, and her leukocytosis is improving. At high risk of aspiration  Acute respiratory insufficiency: Initially intubated extubated on 03/24/2021, still requiring 2 to 4 L of oxygen to keep saturations greater 90%. Out of bed to chair, continue incentive spirometry and flutter valve.  Essential hypertension: Blood pressure is trending up and she is becoming tachycardic, will start on low-dose metoprolol. Remains in sinus rhythm.  Diabetes mellitus type 2 with hyperglycemia: With an A1c of 8.8.  Glucose is slowly improving continue long-acting insulin plus sliding scale.   DVT prophylaxis: lovenox Family Communication:non3 Status is: Inpatient  Remains inpatient appropriate because: Intentional drug overdose psychiatry was consulted and recommended inpatient psychiatric admission    Code Status:     Code Status Orders   (From admission, onward)           Start     Ordered   03/23/21 1502  Full code  Continuous        03/23/21 1502           Code Status History     Date Active Date Inactive Code Status Order ID Comments User Context   03/23/2021 1412 03/23/2021 1502 Full Code [id number]  Regan Lemming, MD ED   03/22/2021 0459 03/23/2021 1339 Full Code [id number]  Malachy Mood, PA ED   02/21/2021 1934 02/24/2021 1654 Full Code [id number]  Lucrezia Starch, MD ED   11/28/2020 2026 11/29/2020 2045 Full Code [id number]  Varney Biles, MD ED   10/17/2020 2159 10/18/2020 1530 Full Code [id number]  Rozetta Nunnery, NP ED   10/17/2020 1645 10/17/2020 2127 Full Code [id number]  Loni Beckwith, PA-C ED   10/17/2020 1318 10/17/2020 1347 Full Code [id number]  Domenic Moras, PA-C ED   09/18/2020 2139 09/19/2020 1703 Full Code [id number]  Milton Ferguson, MD ED   09/14/2020 0135 09/14/2020 1936 Full Code [id number]  Woodfin, Lennie Muckle, NP ED   09/13/2020 2220 09/14/2020 0104 Full Code [id number]  Henderly, Britni A, PA-C ED   09/11/2020 2317 09/12/2020 1614 Full Code [id number]  Margarita Mail, PA-C ED   09/10/2020 2356 09/11/2020 1435 Full Code [id number]  Rozetta Nunnery, NP ED   09/10/2020 1324 09/10/2020 2314 Full Code [id number]  Lucrezia Starch, MD ED   08/26/2020 1450 08/27/2020 1641 Full Code [id number]  Money, Lowry Ram, Vivian ED   08/12/2020 2113 08/13/2020 1746 Full Code [id number]  Rozetta Nunnery, NP ED   08/12/2020 1423 08/12/2020 1950 Full Code [id number]  Lacretia Leigh, MD ED  07/29/2020 2203 07/30/2020 1803 Full Code [id number]  Arnaldo Natal, MD ED   07/23/2020 1635 07/24/2020 2236 Full Code [id number]  Carlisle Cater, PA-C ED   06/05/2020 0037 06/05/2020 2104 Full Code [id number]  Amaryllis Dyke, PA-C ED   06/04/2020 1439 06/04/2020 2026 Full Code [id number]  Ival Bible, MD ED   06/04/2020 1439 06/04/2020 1439 Full Code [id number]  Ival Bible, MD ED   05/26/2020 1901 05/27/2020 1627 Full Code [id number]  Inda Merlin, NP ED   05/26/2020 1430 05/26/2020 1744 Full Code [id number]  Isla Pence, MD ED   05/02/2020 0757 05/05/2020 0631 Full Code [id number]  Prescilla Sours, PA-C Inpatient   04/30/2020 2143 05/02/2020 0359 Full Code [id number]  Robinson, [place] N, PA-C ED   04/11/2020 0252 04/11/2020 1738 Full Code [id number]  Prescilla Sours, PA-C ED   04/10/2020 1349 04/11/2020 0139 Full Code [id number]  Blanchie Dessert, MD ED   10/17/2019 2213 10/18/2019 2021 Full Code [id number]  Mliss Fritz, NP ED   10/16/2019 1957 10/17/2019 2104 Full Code [id number]  Henderly, Britni A, PA-C ED   10/10/2019 1756 10/11/2019 1843 Full Code [id number]  Rankin, [place] B, NP ED   10/10/2019 0302 10/10/2019 1750 Full Code [id number]  Ezequiel Essex, MD ED   10/05/2019 1833 10/06/2019 1533 Full Code [id number]  Deno Etienne, DO ED   09/13/2019 0112 09/13/2019 2210 Full Code [id number]  Mliss Fritz, NP Inpatient   07/14/2019 0130 07/14/2019 1750 Full Code [id number]  Mliss Fritz, NP Inpatient   06/23/2019 0554 06/23/2019 1639 Full Code [id number]  Mliss Fritz, NP Inpatient   06/11/2019 1156 06/12/2019 1633 Full Code [id number]  Consuello Closs, NP Inpatient   06/07/2019 2151 06/08/2019 2035 Full Code [id number]  [place] ED   05/29/2019 2150 06/02/2019 1901 Full Code [id number]  Rozetta Nunnery, NP Inpatient   05/18/2019 2321 05/19/2019 2112 Full Code [id number]  Mliss Fritz, NP Inpatient   10/10/2018 1439 10/11/2018 1606 Full Code [id number]  Derrill Center, NP Inpatient   09/25/2018 1336 09/26/2018 1550 Full Code [id number]  [name], NP Inpatient   05/02/2018 2028 05/06/2018 1724 Full Code [id number]  Lavella Hammock, MD Inpatient   01/05/2018 1058 01/05/2018 1844 Full Code [id number]  Fredia Sorrow, MD ED   12/31/2017 0949 12/31/2017 1823 Full Code [id number]  Milton Ferguson, MD ED   12/26/2017 1756 12/27/2017 1528 Full Code [id number]  Margette Fast, MD ED   11/30/2017 1209 12/06/2017 1614 Full Code [id number]  Nanci Pina, [place] Inpatient    11/29/2017 1906 11/30/2017 1140 Full Code [id number]  Hayden Rasmussen, MD ED   11/03/2017 2247 11/04/2017 1837 Full Code [id number]  Charlann Lange, PA-C ED   10/30/2017 0139 10/30/2017 1645 Full Code [id number]  Charlann Lange, PA-C ED   10/27/2017 0354 10/27/2017 1715 Full Code [id number]  Veryl Speak, MD ED   10/25/2017 2225 10/26/2017 1743 Full Code [id number]  Charlann Lange, PA-C ED   10/18/2017 2104 10/19/2017 1805 Full Code [id number]  Glyn Ade, PA-C ED   10/13/2017 1917 10/14/2017 1342 Full Code [id number]  Tacy Learn, PA-C ED   10/10/2017 0321 10/10/2017 1908 Full Code [id number]  Doristine Devoid, PA-C ED   09/14/2017 1549 09/15/2017 1740 Full Code [id number]  Fransico Meadow, PA-C ED   09/12/2017 0324 09/12/2017 1842 Full Code [id number]  Agapito Games ED   09/06/2017 2142 09/07/2017 1220 Full Code [id number]  Dalia Heading,  PA-C ED   08/13/2017 1822 08/15/2017 1721 Full Code [id number]  Delia Heady, PA-C ED   08/02/2017 2039 08/03/2017 1629 Full Code [id number]  [name], MD ED   07/29/2017 2238 07/30/2017 1918 Full Code [id number]  Robinson, [place] N, PA-C ED   07/22/2017 0128 07/22/2017 0707 Full Code [id number]  Larene Pickett, PA-C ED   07/08/2017 0011 07/08/2017 1922 Full Code [id number]  Flueckiger, Amedeo Kinsman, RN ED   07/07/2017 2328 07/08/2017 0011 Full Code [id number]  Doristine Devoid, PA-C ED   06/19/2017 0616 06/20/2017 1323 Full Code [id number]  Lorin Glass, PA-C ED   03/01/2017 1556 03/02/2017 1359 Full Code [id number]  Street, [place], [place] ED   01/05/2017 0000 01/05/2017 1547 Full Code [id number]  Ward, Delice Bison, DO ED   11/05/2016 1607 11/06/2016 1517 Full Code [id number]  Larene Pickett, PA-C ED   10/01/2016 0119 10/06/2016 1818 Full Code [id number]  McNichol, Fabio Asa, RN Inpatient   08/06/2016 1940 08/07/2016 1611 Full Code [id number]  Margarita Mail, PA-C ED   05/02/2016 2044 05/04/2016 1410 Full Code [id number]  Malvin Johns, MD ED   03/19/2016 1716 03/20/2016 1533 Full Code  [id number]  Roxanna Mew, PA-C ED   02/02/2016 2333 02/03/2016 1444 Full Code [id number]  Lacretia Leigh, MD ED   07/18/2015 0158 07/23/2015 1515 Full Code [id number]  Laverle Hobby, PA-C Inpatient   07/17/2015 1520 07/18/2015 0158 Full Code [id number]  Sherwood Gambler, MD ED   05/09/2015 0307 05/14/2015 1834 Full Code [id number]  Laverle Hobby, PA-C Inpatient   05/08/2015 2202 05/09/2015 0307 Full Code [id number]  Merrily Pew, MD ED   05/08/2015 2159 05/08/2015 2202 Full Code [id number]  Merrily Pew, MD ED   02/20/2015 1455 02/21/2015 1507 Full Code [id number]  Kerrie Buffalo, NP Inpatient   02/19/2015 2317 02/20/2015 1455 Full Code [id number]  Charlesetta Shanks, MD ED   01/15/2015 1642 01/16/2015 1524 Full Code [id number]  Delfin Gant, NP Inpatient   01/14/2015 1349 01/15/2015 1641 Full Code [id number]  Orlie Dakin, MD ED   12/22/2014 1921 12/25/2014 1731 Full Code [id number]  Patrecia Pour, NP Inpatient   12/21/2014 1824 12/22/2014 1921 Full Code [id number]  Noemi Chapel, MD ED   12/20/2014 0010 12/20/2014 1401 Full Code [id number]  Rolland Porter, MD ED   04/11/2014 1929 04/12/2014 0200 Full Code [id number]  Carman Ching, PA-C ED   12/05/2013 0021 12/08/2013 1750 Full Code [id number]  Laverle Hobby, PA-C Inpatient   12/04/2013 1844 12/05/2013 0021 Full Code [id number]  Muthersbaugh, Gwenlyn Perking ED   06/03/2013 2352 06/05/2013 1525 Full Code [id number]  Antonietta Breach, PA-C ED   04/25/2013 1252 04/27/2013 1710 Full Code [id number]  Truett Mainland, DO Inpatient   06/27/2012 1954 06/28/2012 1411 Full Code [id number]  Dewaine Oats, PA-C ED         IV Access:   Peripheral IV   Procedures and diagnostic studies:   No results found.   Medical Consultants:   None.   Subjective:    [name] no difficulty breathing.  Objective:    Vitals:   03/26/21 0600 03/26/21 0700 03/26/21 0728 03/26/21 0800  BP: 104/68 (!) 130/109 131/71 (!) 147/106  Pulse: (!) 105 (!) 106 (!) 110 (!) 108   Resp: (!) 24 17 (!) 22 (!) 31  Temp:   98.3 F (36.8 C)   TempSrc:   Oral   SpO2: 100% 100% 100% 100%  Weight:       SpO2:  100 % FiO2 (%): 40 %   Intake/Output Summary (Last 24 hours) at 03/26/2021 0816 Last data filed at 03/26/2021 0300 Gross per 24 hour  Intake 765.25 ml  Output 1200 ml  Net -434.75 ml   Filed Weights   03/24/21 0147 03/25/21 0500 03/26/21 0500  Weight: 118.8 kg 109.4 kg 108.8 kg    Exam: General exam: In no acute distress. Respiratory system: Good air movement and crackles bilaterally. Cardiovascular system: S1 & S2 heard, RRR. No JVD. Gastrointestinal system: Abdomen is nondistended, soft and nontender.  Extremities: No pedal edema. Skin: No rashes, lesions or ulcers    Data Reviewed:    Labs: Basic Metabolic Panel: Recent Labs  Lab 03/23/21 1506 03/23/21 1623 03/23/21 2155 03/24/21 0046 03/24/21 0352 03/24/21 1710 03/25/21 0101 03/25/21 1609 03/26/21 0629  NA 137   < > 137 137 140  --  137  --  139  K 3.5   < > 4.1 3.9 3.6  --  3.6  --  3.6  CL 105  --  103 105  --   --  106  --  105  CO2 23  --  24 20*  --   --  22  --  24  GLUCOSE 258*  --  314* 283*  --   --  311*  --  185*  BUN 5*  --  <5* 6  --   --  13  --  9  CREATININE 0.75  --  0.82 0.85  --   --  0.92  --  0.68  CALCIUM 8.9  --  9.1 9.0  --   --  8.8*  --  8.7*  MG  --   --  2.2  --   --  2.1 1.9 2.2 1.8  PHOS  --   --   --   --   --  2.5 3.4 3.8 4.1   < > = values in this interval not displayed.   GFR Estimated Creatinine Clearance: 128.3 mL/min (by C-G formula based on SCr of 0.68 mg/dL). Liver Function Tests: Recent Labs  Lab 03/22/21 0025 03/22/21 0532 03/23/21 1506  AST 23 21 26   ALT 22 22 21   ALKPHOS 65 68 63  BILITOT 0.4 0.6 0.6  PROT 7.3 6.9 6.7  ALBUMIN 3.9 3.9 3.7   Recent Labs  Lab 03/22/21 0025  LIPASE 41   Recent Labs  Lab 03/24/21 0118  AMMONIA 43*   Coagulation profile No results for input(s): INR, PROTIME in the last 168  hours. COVID-19 Labs  No results for input(s): DDIMER, FERRITIN, LDH, CRP in the last 72 hours.  Lab Results  Component Value Date   SARSCOV2NAA NEGATIVE 03/23/2021   [place] NEGATIVE 03/22/2021   [place] NEGATIVE 02/21/2021   [place] NEGATIVE 02/01/2021    CBC: Recent Labs  Lab 03/22/21 0025 03/22/21 0532 03/23/21 1451 03/23/21 1506 03/23/21 1623 03/24/21 0046 03/24/21 0352 03/25/21 0101 03/26/21 0629  WBC 14.0* 12.3*  --  9.6  --  13.4*  --  13.1* 10.7*  NEUTROABS 8.5* 7.2  --   --   --   --   --  8.7*  --   HGB 11.4* 11.3*   < > 11.4* 12.6 11.0* 11.2* 11.2* 11.2*  HCT 35.5* 35.3*   < > 36.6 37.0 33.9* 33.0* 35.1* 34.8*  MCV 80.7 80.8  --  83.2  --  80.0  --  81.1 80.9  PLT 308 280  --  216  --  275  --  219 145*   < > = values in this interval not displayed.   Cardiac Enzymes: No results for input(s): CKTOTAL, CKMB, CKMBINDEX, TROPONINI in the last 168 hours. BNP (last 3 results) No results for input(s): PROBNP in the last 8760 hours. CBG: Recent Labs  Lab 03/25/21 1523 03/25/21 1947 03/25/21 2356 03/26/21 0335 03/26/21 0727  GLUCAP 154* 212* 194* 172* 179*   D-Dimer: No results for input(s): DDIMER in the last 72 hours. Hgb A1c: Recent Labs    03/24/21 0048  HGBA1C 8.9*   Lipid Profile: Recent Labs    03/24/21 0048  TRIG 429*   Thyroid function studies: Recent Labs    03/23/21 1412  TSH 2.087   Anemia work up: No results for input(s): VITAMINB12, FOLATE, FERRITIN, TIBC, IRON, RETICCTPCT in the last 72 hours. Sepsis Labs: Recent Labs  Lab 03/23/21 1506 03/24/21 0046 03/25/21 0101 03/26/21 0629  WBC 9.6 13.4* 13.1* 10.7*   Microbiology Recent Results (from the past 240 hour(s))  Wet prep, genital     Status: Abnormal   Collection Time: 03/16/21  3:35 PM  Result Value Ref Range Status   Yeast Wet Prep HPF POC PRESENT (A) NONE SEEN Final   Trich, Wet Prep NONE SEEN NONE SEEN Final   Clue Cells Wet Prep HPF POC PRESENT  (A) NONE SEEN Final   WBC, Wet Prep HPF POC >=10 (A) <10 Final    Comment: Please note change in reference range.   Sperm NONE SEEN  Final    Comment: Performed at [facility], [place] [address], [place],  [address]  Resp Panel by RT-PCR (Flu A&B, Covid) Nasopharyngeal Swab     Status: None   Collection Time: 03/22/21  5:04 AM   Specimen: Nasopharyngeal Swab; Nasopharyngeal(NP) swabs in vial transport medium  Result Value Ref Range Status   SARS Coronavirus 2 by RT PCR NEGATIVE NEGATIVE Final    Comment: (NOTE) SARS-CoV-2 target nucleic acids are NOT DETECTED.  The SARS-CoV-2 RNA is generally detectable in upper respiratory specimens during the acute phase of infection. The lowest concentration of SARS-CoV-2 viral copies this assay can detect is 138 copies/mL. A negative result does not preclude SARS-Cov-2 infection and should not be used as the sole basis for treatment or other patient management decisions. A negative result may occur with  improper specimen collection/handling, submission of specimen other than nasopharyngeal swab, presence of viral mutation(s) within the areas targeted by this assay, and inadequate number of viral copies(<138 copies/mL). A negative result must be combined with clinical observations, patient history, and epidemiological information. The expected result is Negative.  Fact Sheet for Patients:  EntrepreneurPulse.com.au  Fact Sheet for Healthcare Providers:  IncredibleEmployment.be  This test is no t yet approved or cleared by the [place] FDA and  has been authorized for detection and/or diagnosis of SARS-CoV-2 by FDA under an Emergency Use Authorization (EUA). This EUA will remain  in effect (meaning this test can be used) for the duration of the COVID-19 declaration under Section 564(b)(1) of the Act, 21 U.S.C.section 360bbb-3(b)(1), unless the authorization is terminated  or  revoked sooner.       Influenza A by PCR NEGATIVE NEGATIVE Final   Influenza B by PCR NEGATIVE NEGATIVE Final    Comment: (NOTE) The Xpert Xpress SARS-CoV-2/FLU/RSV plus assay is intended as an aid in the diagnosis of influenza from Nasopharyngeal swab specimens and should not be used as a sole basis for treatment. Nasal washings  and aspirates are unacceptable for Xpert Xpress SARS-CoV-2/FLU/RSV testing.  Fact Sheet for Patients: EntrepreneurPulse.com.au  Fact Sheet for Healthcare Providers: IncredibleEmployment.be  This test is not yet approved or cleared by the [place] FDA and has been authorized for detection and/or diagnosis of SARS-CoV-2 by FDA under an Emergency Use Authorization (EUA). This EUA will remain in effect (meaning this test can be used) for the duration of the COVID-19 declaration under Section 564(b)(1) of the Act, 21 U.S.C. section 360bbb-3(b)(1), unless the authorization is terminated or revoked.  Performed at [facility], [place] [address], [place], [place] [address]   Resp Panel by RT-PCR (Flu A&B, Covid) Nasopharyngeal Swab     Status: None   Collection Time: 03/23/21  2:11 PM   Specimen: Nasopharyngeal Swab; Nasopharyngeal(NP) swabs in vial transport medium  Result Value Ref Range Status   SARS Coronavirus 2 by RT PCR NEGATIVE NEGATIVE Final    Comment: (NOTE) SARS-CoV-2 target nucleic acids are NOT DETECTED.  The SARS-CoV-2 RNA is generally detectable in upper respiratory specimens during the acute phase of infection. The lowest concentration of SARS-CoV-2 viral copies this assay can detect is 138 copies/mL. A negative result does not preclude SARS-Cov-2 infection and should not be used as the sole basis for treatment or other patient management decisions. A negative result may occur with  improper specimen collection/handling, submission of specimen other than nasopharyngeal swab, presence of viral  mutation(s) within the areas targeted by this assay, and inadequate number of viral copies(<138 copies/mL). A negative result must be combined with clinical observations, patient history, and epidemiological information. The expected result is Negative.  Fact Sheet for Patients:  EntrepreneurPulse.com.au  Fact Sheet for Healthcare Providers:  IncredibleEmployment.be  This test is no t yet approved or cleared by the [place] FDA and  has been authorized for detection and/or diagnosis of SARS-CoV-2 by FDA under an Emergency Use Authorization (EUA). This EUA will remain  in effect (meaning this test can be used) for the duration of the COVID-19 declaration under Section 564(b)(1) of the Act, 21 U.S.C.section 360bbb-3(b)(1), unless the authorization is terminated  or revoked sooner.       Influenza A by PCR NEGATIVE NEGATIVE Final   Influenza B by PCR NEGATIVE NEGATIVE Final    Comment: (NOTE) The Xpert Xpress SARS-CoV-2/FLU/RSV plus assay is intended as an aid in the diagnosis of influenza from Nasopharyngeal swab specimens and should not be used as a sole basis for treatment. Nasal washings and aspirates are unacceptable for Xpert Xpress SARS-CoV-2/FLU/RSV testing.  Fact Sheet for Patients: EntrepreneurPulse.com.au  Fact Sheet for Healthcare Providers: IncredibleEmployment.be  This test is not yet approved or cleared by the [place] FDA and has been authorized for detection and/or diagnosis of SARS-CoV-2 by FDA under an Emergency Use Authorization (EUA). This EUA will remain in effect (meaning this test can be used) for the duration of the COVID-19 declaration under Section 564(b)(1) of the Act, 21 U.S.C. section 360bbb-3(b)(1), unless the authorization is terminated or revoked.  Performed at [facility], [place] [address] [place], [place] [address]   MRSA Next Gen by PCR, Nasal      Status: None   Collection Time: 03/23/21  5:23 PM   Specimen: Nasal Mucosa; Nasal Swab  Result Value Ref Range Status   MRSA by PCR Next Gen NOT DETECTED NOT DETECTED Final    Comment: (NOTE) The GeneXpert MRSA Assay (FDA approved for NASAL specimens only), is one component of a comprehensive MRSA  colonization surveillance program. It is not intended to diagnose MRSA infection nor to guide or monitor treatment for MRSA infections. Test performance is not FDA approved in patients less than 6 years old. Performed at [facility], [place] [address] [place], [place] [address]      Medications:    Chlorhexidine Gluconate Cloth  6 each Topical Daily   haloperidol lactate  5 mg Intravenous QHS   heparin injection (subcutaneous)  5,000 Units Subcutaneous Q8H   insulin aspart  0-15 Units Subcutaneous Q4H   insulin detemir  40 Units Subcutaneous BID   Continuous Infusions:    LOS: 3 days   Charlynne Cousins  [facility]  03/26/2021, 8:16 AM

## 2021-03-26 NOTE — Plan of Care (Signed)
?  Problem: Safety: ?Goal: Non-violent Restraint(s) ?Outcome: Progressing ?  ?Problem: Education: ?Goal: Knowledge of General Education information will improve ?Description: Including pain rating scale, medication(s)/side effects and non-pharmacologic comfort measures ?Outcome: Progressing ?  ?Problem: Health Behavior/Discharge Planning: ?Goal: Ability to manage health-related needs will improve ?Outcome: Progressing ?  ?Problem: Clinical Measurements: ?Goal: Ability to maintain clinical measurements within normal limits will improve ?Outcome: Progressing ?Goal: Will remain free from infection ?Outcome: Progressing ?Goal: Diagnostic test results will improve ?Outcome: Progressing ?Goal: Respiratory complications will improve ?Outcome: Progressing ?Goal: Cardiovascular complication will be avoided ?Outcome: Progressing ?  ?Problem: Activity: ?Goal: Risk for activity intolerance will decrease ?Outcome: Progressing ?  ?Problem: Nutrition: ?Goal: Adequate nutrition will be maintained ?Outcome: Progressing ?  ?Problem: Coping: ?Goal: Level of anxiety will decrease ?Outcome: Progressing ?  ?Problem: Elimination: ?Goal: Will not experience complications related to bowel motility ?Outcome: Progressing ?Goal: Will not experience complications related to urinary retention ?Outcome: Progressing ?  ?Problem: Pain Managment: ?Goal: General experience of comfort will improve ?Outcome: Progressing ?  ?Problem: Safety: ?Goal: Ability to remain free from injury will improve ?Outcome: Progressing ?  ?Problem: Skin Integrity: ?Goal: Risk for impaired skin integrity will decrease ?Outcome: Progressing ?  ?

## 2021-03-26 NOTE — Consult Note (Addendum)
[facility] Consult   Reason for Consult:  Intentional overdose Referring Physician:  [name], NP Patient Identification: [name] MRN:  [id number] Principal Diagnosis: Suicidal overdose [facility]) Diagnosis:  Principal Problem:   Suicidal overdose ([place]) Active Problems:   Intentional overdose [facility])  Assessment  [name] is a 30 y.o. female admitted medically for 03/23/2021  1:39 PM for an overdose on home medications. She was notably discharged from the [facility] shortly  She carries the psychiatric diagnoses of schizoaffective disorder (vs bipolar I) and borderline personality disorder and has a past medical history of cognitive deficits, diabetes, hypertension and obesity. Psychiatry was consulted for suicide attempt by [name] NP.   Patient continues to endorse poor coping skills and endorses SI/ SA as her primary coping skill at this time. Patient endorses having knowledge of other coping skills, but continues to have a hard time with processing. Notably NT heard patient on phone with her boyfriend (please see note) will recommend that patient receive Syphilis labs and if needed treatment. Have also talked to patient about the fact that her current SI is related to her boyfriend upsetting her. Of note patient's boyfriend has been barred by mom from seeing patient. Provider spoke with patient about no longer calling her boyfriend as this is only causing her psychological distress, patient endorsed understanding and agreement. Unfortunately patient hx suggest that she is impulsive; therefore safety remains a large concern and patient may not be consistent in decreasing her stressors.  Plan  ## Safety and Observation Level:  - Based on my clinical evaluation, I estimate the patient to be at high risk of self harm in the current setting - At this time, we recommend a 1:1 level of observation. This decision is based on my review of the chart including patient's  history and current presentation, interview of the patient, mental status examination, and consideration of suicide risk including evaluating suicidal ideation, plan, intent, suicidal or self-harm behaviors, risk factors, and protective factors. This judgment is based on our ability to directly address suicide risk, implement suicide prevention strategies and develop a safety plan while the patient is in the clinical setting. Please contact our team if there is a concern that risk level has changed.     ## Medications:  -- Continue haldol 5 mg Hold other home meds     ## Medical Decision Making Capacity:  Not formally assesssed   ## Further Work-up:  -- tsh wnl -- no vitamin B12, folate recorded - will rec tomorrow   ## Disposition:  -- Patient medically stable, recommend SW consult to fax patient out for inpatient psychiatry bed   ##Legal Status -- currently voluntary, will IVC if tries to leave   Thank you for this consult request. Recommendations have been communicated to the primary team.  We will continue to follow at this time.   Total Time spent with patient: 15 minutes  Subjective:   [name] is a 30 y.o. female patient admitted with intentional overdose on home medications. From ED traige note Omezprazole 20mg  x7, Vit D 1000mg  x7, Prazosin 5mg  x7, Simvastatin 20mg x7, Lithium 450mg  x7, Amlodipine 5mg  x7, Hydroxizine 50 mg x7, Benztropine 1mg  x7, Primidone 50mg  x11, Divalproex 500mg  x11, Haldol 10mg  x13.  HPI:  On assessment today patient reports she has an upset stomach and is not happy today. Patient is Aox4. Patient reports that she is unhappy because she was on the phone with her boyfriend this AM and he upset  her. Patient reported that as a result she is having "some thoughts" of SI with a plan to overdose. Patient and provider talked about coping skills that patient has learned. Patient reported that she knows she should "ignore" her boyfriend. Patient  reports that she will try to do other things today to preoccupy her mind. Patient denies HI and AVH.    Past Medical History:  Past Medical History:  Diagnosis Date   Anxiety    Bipolar 1 disorder ([place])    Cognitive deficits    Depression    Diabetes mellitus without complication ([place])    Hypertension    Mental disorder    Mental health disorder    Obesity     Past Surgical History:  Procedure Laterality Date   CESAREAN SECTION     CESAREAN SECTION N/A 04/25/2013   Procedure: REPEAT CESAREAN SECTION;  Surgeon: [name], MD;  Location: [facility];  Service: Obstetrics;  Laterality: N/A;   MASS EXCISION N/A 06/03/2012   Procedure: EXCISION MASS;  Surgeon: [name], MD;  Location: [facility];  Service: ENT;  Laterality: N/A;  Excision uvula mass   TONSILLECTOMY N/A 06/03/2012   Procedure: TONSILLECTOMY;  Surgeon: [name], MD;  Location: [facility];  Service: ENT;  Laterality: N/A;   TONSILLECTOMY     Family History:  Family History  Problem Relation Age of Onset   Hypertension Mother    Diabetes Father    Social History:  Social History   Substance and Sexual Activity  Alcohol Use Not Currently     Social History   Substance and Sexual Activity  Drug Use Not Currently   Types: "Crack" cocaine, Other-see comments   Comment: Patient reports hx of smoking Crack    Social History   Socioeconomic History   Marital status: Single    Spouse name: Not on file   Number of children: Not on file   Years of education: Not on file   Highest education level: Not on file  Occupational History   Not on file  Tobacco Use   Smoking status: Every Day    Types: Cigars   Smokeless tobacco: Never   Tobacco comments:    Pt declined  Vaping Use   Vaping Use: Never used  Substance and Sexual Activity   Alcohol use: Not Currently   Drug use: Not Currently    Types: "Crack" cocaine, Other-see comments    Comment: Patient reports hx of  smoking Crack   Sexual activity: Not Currently    Birth control/protection: None    Comment: occasionally  Other Topics Concern   Not on file  Social History Narrative   ** Merged History Encounter **       Social Determinants of Health   Financial Resource Strain: Not on file  Food Insecurity: Not on file  Transportation Needs: Not on file  Physical Activity: Not on file  Stress: Not on file  Social Connections: Not on file   Additional Social History:    Allergies:   Allergies  Allergen Reactions   Wellbutrin [Bupropion] Shortness Of Breath   Omnipaque [Iohexol] Swelling and Other (See Comments)    Eye swelling   Contrast Media [Iodinated Diagnostic Agents]     Eyes swell up   Depakote [Divalproex Sodium]     Mouth sores. dizziness   Hydroxyzine     Causes hyperactivity, makes pt want to "fight"   Penicillin G Hives    Labs:  Results for  orders placed or performed during the hospital encounter of 03/23/21 (from the past 48 hour(s))  Glucose, capillary     Status: Abnormal   Collection Time: 03/24/21  3:51 PM  Result Value Ref Range   Glucose-Capillary 257 (H) 70 - 99 mg/dL    Comment: Glucose reference range applies only to samples taken after fasting for at least 8 hours.  Magnesium     Status: None   Collection Time: 03/24/21  5:10 PM  Result Value Ref Range   Magnesium 2.1 1.7 - 2.4 mg/dL    Comment: Performed at [facility], [place] [address], [place], [place] [address]  Phosphorus     Status: None   Collection Time: 03/24/21  5:10 PM  Result Value Ref Range   Phosphorus 2.5 2.5 - 4.6 mg/dL    Comment: Performed at [facility] [address] [place], [place] [address]  Glucose, capillary     Status: Abnormal   Collection Time: 03/24/21  7:55 PM  Result Value Ref Range   Glucose-Capillary 258 (H) 70 - 99 mg/dL    Comment: Glucose reference range applies only to samples taken after fasting for at least 8 hours.  Glucose, capillary      Status: Abnormal   Collection Time: 03/24/21 11:56 PM  Result Value Ref Range   Glucose-Capillary 304 (H) 70 - 99 mg/dL    Comment: Glucose reference range applies only to samples taken after fasting for at least 8 hours.  Magnesium     Status: None   Collection Time: 03/25/21  1:01 AM  Result Value Ref Range   Magnesium 1.9 1.7 - 2.4 mg/dL    Comment: Performed at [facility] [address], [place], [place] [address]  Phosphorus     Status: None   Collection Time: 03/25/21  1:01 AM  Result Value Ref Range   Phosphorus 3.4 2.5 - 4.6 mg/dL    Comment: Performed at [facility] [address] [place], [place] [address]  CBC with Differential/Platelet     Status: Abnormal   Collection Time: 03/25/21  1:01 AM  Result Value Ref Range   WBC 13.1 (H) 4.0 - 10.5 K/uL   RBC 4.33 3.87 - 5.11 MIL/uL   Hemoglobin 11.2 (L) 12.0 - 15.0 g/dL   HCT 35.1 (L) 36.0 - 46.0 %   MCV 81.1 80.0 - 100.0 fL   MCH 25.9 (L) 26.0 - 34.0 pg   MCHC 31.9 30.0 - 36.0 g/dL   RDW 15.3 11.5 - 15.5 %   Platelets 219 150 - 400 K/uL    Comment: REPEATED TO VERIFY   nRBC 0.0 0.0 - 0.2 %   Neutrophils Relative % 68 %   Neutro Abs 8.7 (H) 1.7 - 7.7 K/uL   Lymphocytes Relative 23 %   Lymphs Abs 3.1 0.7 - 4.0 K/uL   Monocytes Relative 7 %   Monocytes Absolute 0.9 0.1 - 1.0 K/uL   Eosinophils Relative 2 %   Eosinophils Absolute 0.3 0.0 - 0.5 K/uL   Basophils Relative 0 %   Basophils Absolute 0.1 0.0 - 0.1 K/uL   Immature Granulocytes 0 %   Abs Immature Granulocytes 0.04 0.00 - 0.07 K/uL    Comment: Performed at [facility], [name] [address] [place], [place] [address]  Basic metabolic panel     Status: Abnormal   Collection Time: 03/25/21  1:01 AM  Result Value Ref Range   Sodium 137  135 - 145 mmol/L   Potassium 3.6 3.5 - 5.1 mmol/L   Chloride 106 98 - 111 mmol/L   CO2 22 22 - 32 mmol/L   Glucose, Bld 311 (H) 70 - 99 mg/dL    Comment: Glucose reference range applies only to samples taken  after fasting for at least 8 hours.   BUN 13 6 - 20 mg/dL   Creatinine, Ser 0.92 0.44 - 1.00 mg/dL   Calcium 8.8 (L) 8.9 - 10.3 mg/dL   GFR, Estimated >60 >60 mL/min    Comment: (NOTE) Calculated using the CKD-EPI Creatinine Equation (2021)    Anion gap 9 5 - 15    Comment: Performed at [facility] [address], [place], [place] [address]  Glucose, capillary     Status: Abnormal   Collection Time: 03/25/21  3:29 AM  Result Value Ref Range   Glucose-Capillary 314 (H) 70 - 99 mg/dL    Comment: Glucose reference range applies only to samples taken after fasting for at least 8 hours.  Glucose, capillary     Status: Abnormal   Collection Time: 03/25/21  7:43 AM  Result Value Ref Range   Glucose-Capillary 288 (H) 70 - 99 mg/dL    Comment: Glucose reference range applies only to samples taken after fasting for at least 8 hours.  Glucose, capillary     Status: Abnormal   Collection Time: 03/25/21 11:13 AM  Result Value Ref Range   Glucose-Capillary 290 (H) 70 - 99 mg/dL    Comment: Glucose reference range applies only to samples taken after fasting for at least 8 hours.  Glucose, capillary     Status: Abnormal   Collection Time: 03/25/21  3:23 PM  Result Value Ref Range   Glucose-Capillary 154 (H) 70 - 99 mg/dL    Comment: Glucose reference range applies only to samples taken after fasting for at least 8 hours.  Magnesium     Status: None   Collection Time: 03/25/21  4:09 PM  Result Value Ref Range   Magnesium 2.2 1.7 - 2.4 mg/dL    Comment: Performed at [facility], [place] [address] [place], [place] [address]  Phosphorus     Status: None   Collection Time: 03/25/21  4:09 PM  Result Value Ref Range   Phosphorus 3.8 2.5 - 4.6 mg/dL    Comment: Performed at [facility] [address] [place], [place] [address]  Glucose, capillary     Status: Abnormal   Collection Time: 03/25/21  7:47 PM  Result Value Ref Range   Glucose-Capillary 212 (H) 70 - 99 mg/dL     Comment: Glucose reference range applies only to samples taken after fasting for at least 8 hours.  Glucose, capillary     Status: Abnormal   Collection Time: 03/25/21 11:56 PM  Result Value Ref Range   Glucose-Capillary 194 (H) 70 - 99 mg/dL    Comment: Glucose reference range applies only to samples taken after fasting for at least 8 hours.  Glucose, capillary     Status: Abnormal   Collection Time: 03/26/21  3:35 AM  Result Value Ref Range   Glucose-Capillary 172 (H) 70 - 99 mg/dL    Comment: Glucose reference range applies only to samples taken after fasting for at least 8 hours.  Magnesium     Status: None   Collection Time: 03/26/21  6:29 AM  Result Value Ref Range   Magnesium 1.8 1.7 - 2.4 mg/dL  Comment: Performed at [facility], [place] [address] [place], [place] [address]  Phosphorus     Status: None   Collection Time: 03/26/21  6:29 AM  Result Value Ref Range   Phosphorus 4.1 2.5 - 4.6 mg/dL    Comment: Performed at [facility] [address] [place], [place] [address]  Basic metabolic panel     Status: Abnormal   Collection Time: 03/26/21  6:29 AM  Result Value Ref Range   Sodium 139 135 - 145 mmol/L   Potassium 3.6 3.5 - 5.1 mmol/L   Chloride 105 98 - 111 mmol/L   CO2 24 22 - 32 mmol/L   Glucose, Bld 185 (H) 70 - 99 mg/dL    Comment: Glucose reference range applies only to samples taken after fasting for at least 8 hours.   BUN 9 6 - 20 mg/dL   Creatinine, Ser 0.68 0.44 - 1.00 mg/dL   Calcium 8.7 (L) 8.9 - 10.3 mg/dL   GFR, Estimated >60 >60 mL/min    Comment: (NOTE) Calculated using the CKD-EPI Creatinine Equation (2021)    Anion gap 10 5 - 15    Comment: Performed at [facility] [address], [place], [place] [address]  CBC     Status: Abnormal   Collection Time: 03/26/21  6:29 AM  Result Value Ref Range   WBC 10.7 (H) 4.0 - 10.5 K/uL   RBC 4.30 3.87 - 5.11 MIL/uL   Hemoglobin 11.2 (L) 12.0 - 15.0 g/dL   HCT 34.8 (L) 36.0 - 46.0 %    MCV 80.9 80.0 - 100.0 fL   MCH 26.0 26.0 - 34.0 pg   MCHC 32.2 30.0 - 36.0 g/dL   RDW 15.5 11.5 - 15.5 %   Platelets 145 (L) 150 - 400 K/uL    Comment: REPEATED TO VERIFY PLATELET COUNT CONFIRMED BY SMEAR    nRBC 0.0 0.0 - 0.2 %    Comment: Performed at [facility], [place] [address], [place], [place] [address]  Glucose, capillary     Status: Abnormal   Collection Time: 03/26/21  7:27 AM  Result Value Ref Range   Glucose-Capillary 179 (H) 70 - 99 mg/dL    Comment: Glucose reference range applies only to samples taken after fasting for at least 8 hours.  Glucose, capillary     Status: Abnormal   Collection Time: 03/26/21 11:53 AM  Result Value Ref Range   Glucose-Capillary 200 (H) 70 - 99 mg/dL    Comment: Glucose reference range applies only to samples taken after fasting for at least 8 hours.    Current Facility-Administered Medications  Medication Dose Route Frequency Provider Last Rate Last Admin   Chlorhexidine Gluconate Cloth 2 % PADS 6 each  6 each Topical Daily Hunsucker, Bonna Gains, MD   6 each at 03/26/21 0952   feeding supplement (GLUCERNA SHAKE) (GLUCERNA SHAKE) liquid 237 mL  237 mL Oral TID BM Charlynne Cousins, MD       haloperidol (HALDOL) tablet 5 mg  5 mg Oral QHS Hunsucker, Bonna Gains, MD       heparin injection 5,000 Units  5,000 Units Subcutaneous Q8H Hunsucker, Bonna Gains, MD   5,000 Units at 03/26/21 1314   insulin aspart (novoLOG) injection 0-15 Units  0-15 Units Subcutaneous TID WC Charlynne Cousins, MD   3 Units at 03/26/21 1215   insulin aspart (novoLOG) injection 0-5 Units  0-5 Units Subcutaneous QHS Charlynne Cousins,  MD       insulin aspart (novoLOG) injection 4 Units  4 Units Subcutaneous TID WC Charlynne Cousins, MD       insulin detemir (LEVEMIR) injection 40 Units  40 Units Subcutaneous BID Hunsucker, Bonna Gains, MD   40 Units at 03/26/21 3846   LORazepam (ATIVAN) injection 2 mg  2 mg Intravenous Q6H PRN Tilden Dome, MD   2 mg at  03/26/21 0428   metoprolol tartrate (LOPRESSOR) tablet 12.5 mg  12.5 mg Oral BID Charlynne Cousins, MD   12.5 mg at 03/26/21 6599   multivitamin with minerals tablet 1 tablet  1 tablet Oral Daily Charlynne Cousins, MD   1 tablet at 03/26/21 3570   potassium chloride 10 mEq in 100 mL IVPB  10 mEq Intravenous Q1 Hr x 5 Charlynne Cousins, MD 100 mL/hr at 03/26/21 1421 10 mEq at 03/26/21 1421   promethazine (PHENERGAN) tablet 12.5 mg  12.5 mg Oral Q6H PRN Charlynne Cousins, MD       Or   promethazine (PHENERGAN) 6.25 mg in sodium chloride 0.9 % 50 mL IVPB  6.25 mg Intravenous Q6H PRN Charlynne Cousins, MD       Or   promethazine (PHENERGAN) suppository 12.5 mg  12.5 mg Rectal Q6H PRN Charlynne Cousins, MD         Psychiatric Specialty Exam:  Presentation  General Appearance: Appropriate for Environment  Eye Contact:Fair  Speech:Slow  Speech Volume:Decreased  Handedness:Right   Mood and Affect  Mood:Dysphoric  Affect:Restricted   Thought Process  Thought Processes:Linear  Descriptions of Associations:Circumstantial  Orientation:Full (Time, Place and Person)  Thought Content:Perseveration  History of Schizophrenia/Schizoaffective disorder:Yes  Duration of Psychotic Symptoms:N/A  Hallucinations:Hallucinations: None  Ideas of Reference:None  Suicidal Thoughts:Suicidal Thoughts: Yes, Passive SI Passive Intent and/or Plan: Without Means to Carry Out  Homicidal Thoughts:Homicidal Thoughts: No   Sensorium  Memory:Immediate Fair; Recent Fair; Remote Poor  Judgment:Impaired  Insight:None   Executive Functions  Concentration:Fair  Attention Span:Fair  [place] of Knowledge:Poor  Language:Fair   Psychomotor Activity  Psychomotor Activity:Psychomotor Activity: Decreased   Assets  Assets:Communication Skills; Resilience; Housing   Sleep  Sleep:Sleep: Poor   Physical Exam: Physical Exam HENT:     Head: Normocephalic and  atraumatic.  Neurological:     Mental Status: She is alert and oriented to person, place, and time.   Review of Systems  Psychiatric/Behavioral:  Positive for depression and suicidal ideas. Negative for hallucinations.   Blood pressure (!) 141/91, pulse 99, temperature 98.7 F (37.1 C), temperature source Oral, resp. rate 17, weight 108.8 kg, SpO2 96 %. Body mass index is 37.57 kg/m.  PGY-2  Freida Busman, MD 03/26/2021 3:16 PM

## 2021-03-26 NOTE — Progress Notes (Signed)
Nutrition Follow-up  DOCUMENTATION CODES:   Not applicable  INTERVENTION:   - Glucerna Shake po TID, each supplement provides 220 kcal and 10 grams of protein  - MVI with minerals daily   NUTRITION DIAGNOSIS:   Inadequate oral intake related to inability to eat as evidenced by NPO status.  Progressing, pt now on Regular diet  GOAL:   Patient will meet greater than or equal to 90% of their needs  Progressing  MONITOR:   PO intake, Supplement acceptance, Labs, Weight trends  REASON FOR ASSESSMENT:   Ventilator, Consult Enteral/tube feeding initiation and management  ASSESSMENT:   30 year old female who presented to the ED on 11/27 after an intentional overdose. PMH of anxiety, bipolar 1 disorder, schizophrenia, depression, T2DM, HTN. Pt required intubation for airway protection.  11/29 - extubated, diet advanced to Regular  Pt extubated yesterday. Psychiatry recommending inpatient psych admission.  Unable to reach pt via phone call. Noted documented episode of N/V this morning at 0428. Nausea precipitating factors listed as food, anxiety/stress.  PO intake of 20% documented for dinner meal yesterday. RD to order oral nutrition supplements to aid pt in meeting kcal and protein needs. Will also order daily MVI with minerals.  Admit weight: 118.8 kg Current weight: 108.8 kg  Meal Completion: 20% x 1 documented meal on 11/29  Medications reviewed and include: SSI, novolog 4 units TID with meals, levemir 40 units BID  Labs reviewed. CBG's: 154-290 x 24 hours  UOP: 1200 ml x 24 hours I/O's: +2.6 L since admit  NUTRITION - FOCUSED PHYSICAL EXAM:  Unable to complete at this time. RD working remotely.  Diet Order:   Diet Order             Diet regular Room service appropriate? Yes; Fluid consistency: Thin  Diet effective now                   EDUCATION NEEDS:   No education needs have been identified at this time  Skin:  Skin Assessment: Reviewed RN  Assessment  Last BM:  no documented BM  Height:   Ht Readings from Last 1 Encounters:  03/16/21 5\' 7"  (1.702 m)    Weight:   Wt Readings from Last 1 Encounters:  03/26/21 108.8 kg    Ideal Body Weight:  61.4 kg  BMI:  Body mass index is 37.57 kg/m.  Estimated Nutritional Needs:   Kcal:  1900-2100  Protein:  100-115 grams  Fluid:  >/= 1.8 L    Gustavus Bryant, MS, RD, LDN Inpatient Clinical Dietitian Please see AMiON for contact information.

## 2021-03-26 NOTE — TOC Progression Note (Signed)
Transition of Care [facility]) - Initial/Assessment Note    Patient Details  Name: [name] MRN: [id number] Date of Birth: 03/23/1991  Transition of Care [facility]) CM/SW Contact:    Milinda Antis, LCSWA Phone Number: 03/26/2021, 12:42 PM  Clinical Narrative:                 CSW contacted [facility].  The facility does not have any adult beds at this time.        Patient Goals and CMS Choice        Expected Discharge Plan and Services                                                Prior Living Arrangements/Services                       Activities of Daily Living      Permission Sought/Granted                  Emotional Assessment              Admission diagnosis:  Encounter for intubation [Z01.818] Suicidal overdose ([place]) [T50.902A] Patient Active Problem List   Diagnosis Date Noted   Suicidal overdose ([place]) 03/23/2021   Syphilis 07/15/2020   Malingering 06/05/2020   Gastroesophageal reflux disease 05/04/2020   Hyperglycemia due to type 2 diabetes mellitus ([place]) 05/04/2020   Long term (current) use of insulin ([place]) 05/04/2020   Migraine without aura 05/04/2020   Morbid obesity ([place]) 05/04/2020   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 2 diabetes mellitus ([place]) 05/04/2020   Prolapsed internal hemorrhoids 05/04/2020   Vitamin D deficiency 05/04/2020   Other symptoms and signs involving cognitive functions and awareness 05/04/2020   Suicide attempt ([place])    Anxiety state 03/06/2020   Schizophrenia ([place]) 09/13/2019   Bipolar I disorder, most recent episode depressed ([place]) 06/23/2019   MDD (major depressive disorder) 10/10/2018   Schizoaffective disorder, bipolar type ([place]) 09/25/2018   Affective psychosis, bipolar ([place]) 06/13/2018   HTN (hypertension) 05/03/2018   Tobacco use disorder 05/03/2018   Adjustment disorder with emotional disturbance 01/02/2018   Schizophrenia, 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   Moderate bipolar I disorder, most recent  episode depressed ([place])    Psychosis ([place])    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/2017   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   OCD (obsessive compulsive disorder) 10/05/2016   Major depressive disorder, recurrent episode, mild ([place]) 05/04/2016   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 [place] [address] Phone: [phone] Fax: [place], [place] - [address] [place] [address] Phone: [phone] Fax: [place], [place]  [place] [address] Phone: [phone] Fax: [phone]     Social Determinants of Health (SDOH) Interventions    Readmission Risk Interventions No flowsheet data found.

## 2021-03-26 NOTE — Progress Notes (Signed)
Pt requested a phone this morning to talk to her mother. Staff provided pt with a phone. She called her mother and her boyfriend.   After phone call to her boyfriend pt called her mother again stating "He said that he is at [facility] long and that he has syphilis and that he says I gave it to him" Pt was upset and has grown increasingly worried. Writer discussed the phone calls with RN and both agree that communication with pt's boyfriend is not therapeutic for her at this time.   Discussed with pt phone usage and explained that due to her boyfriend upsetting her we will only be allowing phone usage today for phone calls to her mother.

## 2021-03-26 NOTE — Progress Notes (Signed)
Spoke with Lolita Patella with poison control.  Update was given and she stated they would be signing off at this time.

## 2021-03-27 DIAGNOSIS — T50902A Poisoning by unspecified drugs, medicaments and biological substances, intentional self-harm, initial encounter: Secondary | ICD-10-CM | POA: Diagnosis not present

## 2021-03-27 LAB — GLUCOSE, CAPILLARY
Glucose-Capillary: 209 mg/dL — ABNORMAL HIGH (ref 70–99)
Glucose-Capillary: 200 mg/dL — ABNORMAL HIGH (ref 70–99)
Glucose-Capillary: 164 mg/dL — ABNORMAL HIGH (ref 70–99)
Glucose-Capillary: 240 mg/dL — ABNORMAL HIGH (ref 70–99)

## 2021-03-27 LAB — RPR: RPR Ser Ql: NONREACTIVE

## 2021-03-27 MED ORDER — TRIMETHOBENZAMIDE HCL 100 MG/ML IM SOLN
200.0000 mg | Freq: Four times a day (QID) | INTRAMUSCULAR | Status: DC | PRN
  Filled 2021-03-27: qty 2

## 2021-03-27 MED ORDER — METOPROLOL TARTRATE 5 MG/5ML IV SOLN
5.0000 mg | Freq: Once | INTRAVENOUS | Status: AC
  Administered 2021-03-27: 5 mg via INTRAVENOUS
  Filled 2021-03-27: qty 5

## 2021-03-27 NOTE — TOC Progression Note (Addendum)
Transition of Care [facility]) - Progression Note    Patient Details  Name: Renalda Locklin MRN: [id number] Date of Birth: [birth year]  Transition of [facility]) CM/SW R[place]oso Downs, LCSW Phone Number: 03/27/2021, 10:54 AM  Clinical Narrative:    [facility] does not have any beds available today. CSW faxed out to outside facilities:       Expected Discharge Plan and Services                                                 Social Determinants of Health (SDOH) Interventions    Readmission Risk Interventions No flowsheet data found.

## 2021-03-27 NOTE — Progress Notes (Signed)
[facility] PROGRESS NOTE    Progress Note  Teletha Petrea  RWE:[id number] DOB: 1991-01-27 DOA: 03/23/2021 PCP: Trey Sailors, PA     Brief Narrative:   Bayli Quesinberry is an 30 y.o. female past medical history of bipolar disorder, schizophrenia diabetes mellitus type 2 recently discharged from old [place] 4 to 6 weeks ago was brought in by EMS for intentional overdose recently released from [facility] after family dispute with her family and boyfriend verbalized homicidal ideations to her boyfriend and ODD on 03/22/2021, she was intubated due to agitation   Assessment/Plan:   Acute toxic encephalopathy due to intentional drug overdose with a prolonged QTC in the setting of history of bipolar disorder schizophrenia major depressive disorder: Now awake, twelve-lead EKG on 11:30 am QTC of 425. Psych was consulted SIRS, they recommended Haldol inpatient psychiatric placement, given the severity of her OD and underlying depressive disorder. They also recommended one-to-one sitter. Awaiting Inpatient psyq admission.  Mild leukocytosis with a left shift: Afebrile normotensive in sinus rhythm, and her leukocytosis is improving. At high risk of aspiration  Acute respiratory insufficiency: Initially intubated extubated on 03/24/2021, still requiring 2 to 4 L of oxygen to keep saturations greater 90%. Out of bed to chair, continue incentive spirometry and flutter valve.  Essential hypertension: Blood pressure is trending up and she is becoming tachycardic, will start on low-dose metoprolol. Remains in sinus rhythm.  Diabetes mellitus type 2 with hyperglycemia: With an A1c of 8.8.  Glucose is slowly improving continue long-acting insulin plus sliding scale.   DVT prophylaxis: lovenox Family Communication:non3 Status is: Inpatient  Remains inpatient appropriate because: Intentional drug overdose psychiatry was consulted and recommended inpatient psychiatric  admission    Code Status:     Code Status Orders  (From admission, onward)           Start     Ordered   03/23/21 1502  Full code  Continuous        03/23/21 1502           Code Status History     Date Active Date Inactive Code Status Order ID Comments User Context   03/23/2021 1412 03/23/2021 1502 Full Code [id number]  Regan Lemming, MD ED   03/22/2021 0459 03/23/2021 1339 Full Code [id number]  Malachy Mood, PA ED   02/21/2021 1934 02/24/2021 1654 Full Code [id number]  Lucrezia Starch, MD ED   11/28/2020 2026 11/29/2020 2045 Full Code [id number]  Varney Biles, MD ED   10/17/2020 2159 10/18/2020 1530 Full Code [id number]  Rozetta Nunnery, NP ED   10/17/2020 1645 10/17/2020 2127 Full Code [id number]  Loni Beckwith, PA-C ED   10/17/2020 1318 10/17/2020 1347 Full Code [id number]  Domenic Moras, PA-C ED   09/18/2020 2139 09/19/2020 1703 Full Code [id number]  Milton Ferguson, MD ED   09/14/2020 0135 09/14/2020 1936 Full Code [id number]  [place], Lennie Muckle, NP ED   09/13/2020 2220 09/14/2020 0104 Full Code [id number]  Henderly, Britni A, PA-C ED   09/11/2020 2317 09/12/2020 1614 Full Code [id number]  Margarita Mail, PA-C ED   09/10/2020 2356 09/11/2020 1435 Full Code [id number]  Rozetta Nunnery, NP ED   09/10/2020 1324 09/10/2020 2314 Full Code [id number]  Lucrezia Starch, MD ED   08/26/2020 1450 08/27/2020 1641 Full Code [id number]  Money, Lowry Ram, [place] ED   08/12/2020 2113 08/13/2020 1746 Full Code [id number]  Rozetta Nunnery, NP ED   08/12/2020 1423 08/12/2020 1950 Full Code [id number]  Lacretia Leigh, MD ED   07/29/2020 2203 07/30/2020 1803 Full Code [id number]  Arnaldo Natal, MD ED   07/23/2020 1635 07/24/2020 2236 Full Code [id number]  Carlisle Cater, PA-C ED   06/05/2020 0037 06/05/2020 2104 Full Code [id number]  Amaryllis Dyke, PA-C ED   06/04/2020 1439 06/04/2020 2026 Full Code [id number]  Ival Bible, MD ED   06/04/2020 1439 06/04/2020 1439 Full Code [id number]  Ival Bible, MD ED   05/26/2020  1901 05/27/2020 1627 Full Code [id number]  Inda Merlin, NP ED   05/26/2020 1430 05/26/2020 1744 Full Code [id number]  Isla Pence, MD ED   05/02/2020 0757 05/05/2020 0631 Full Code [id number]  Prescilla Sours, PA-C Inpatient   04/30/2020 2143 05/02/2020 0359 Full Code [id number]  Robinson, [place] N, PA-C ED   04/11/2020 0252 04/11/2020 1738 Full Code [id number]  Prescilla Sours, PA-C ED   04/10/2020 1349 04/11/2020 0139 Full Code [id number]  Blanchie Dessert, MD ED   10/17/2019 2213 10/18/2019 2021 Full Code [id number]  Mliss Fritz, NP ED   10/16/2019 1957 10/17/2019 2104 Full Code [id number]  Henderly, Britni A, PA-C ED   10/10/2019 1756 10/11/2019 1843 Full Code [id number]  Rankin, Beechwood B, NP ED   10/10/2019 0302 10/10/2019 1750 Full Code [id number]  Ezequiel Essex, MD ED   10/05/2019 1833 10/06/2019 1533 Full Code [id number]  Deno Etienne, DO ED   09/13/2019 0112 09/13/2019 2210 Full Code [id number]  Mliss Fritz, NP Inpatient   07/14/2019 0130 07/14/2019 1750 Full Code [id number]  Mliss Fritz, NP Inpatient   06/23/2019 0554 06/23/2019 1639 Full Code [id number]  Mliss Fritz, NP Inpatient   06/11/2019 1156 06/12/2019 1633 Full Code [id number]  Rankin, [place], NP Inpatient   06/07/2019 2151 06/08/2019 2035 Full Code [id number]  Tivis Ringer, Cortni S, PA-C ED   05/29/2019 2150 06/02/2019 1901 Full Code [id number]  Rozetta Nunnery, NP Inpatient   05/18/2019 2321 05/19/2019 2112 Full Code [id number]  Mliss Fritz, NP Inpatient   10/10/2018 1439 10/11/2018 1606 Full Code [id number]  Derrill Center, NP Inpatient   09/25/2018 1336 09/26/2018 1550 Full Code [id number]  [name], NP Inpatient   05/02/2018 2028 05/06/2018 1724 Full Code [id number]  Lavella Hammock, MD Inpatient   01/05/2018 1058 01/05/2018 1844 Full Code [id number]  Fredia Sorrow, MD ED   12/31/2017 0949 12/31/2017 1823 Full Code [id number]  Milton Ferguson, MD ED   12/26/2017 1756 12/27/2017 1528 Full Code [id number]  Margette Fast, MD ED   11/30/2017 1209 12/06/2017  1614 Full Code [id number]  Nanci Pina, [place] Inpatient   11/29/2017 1906 11/30/2017 1140 Full Code [id number]  Hayden Rasmussen, MD ED   11/03/2017 2247 11/04/2017 1837 Full Code [id number]  Charlann Lange, PA-C ED   10/30/2017 0139 10/30/2017 1645 Full Code [id number]  Charlann Lange, PA-C ED   10/27/2017 0354 10/27/2017 1715 Full Code [id number]  Veryl Speak, MD ED   10/25/2017 2225 10/26/2017 1743 Full Code [id number]  Charlann Lange, PA-C ED   10/18/2017 2104 10/19/2017 1805 Full Code [id number]  Glyn Ade, PA-C ED   10/13/2017 1917 10/14/2017 1342 Full Code [id number]  Tacy Learn, PA-C ED   10/10/2017 0321 10/10/2017 1908 Full Code [id number]  Doristine Devoid, PA-C ED   09/14/2017 1549 09/15/2017 1740 Full Code [id number]  Fransico Meadow, PA-C ED   09/12/2017 0324 09/12/2017 1842 Full Code [id number]  Abigail Butts, PA-C ED   09/06/2017 2142 09/07/2017 1220  Full Code [id number]  Dalia Heading, PA-C ED   08/13/2017 1822 08/15/2017 1721 Full Code [id number]  Delia Heady, PA-C ED   08/02/2017 2039 08/03/2017 1629 Full Code [id number]  [name], MD ED   07/29/2017 2238 07/30/2017 1918 Full Code [id number]  Robinson, [place] N, PA-C ED   07/22/2017 0128 07/22/2017 0707 Full Code [id number]  Larene Pickett, PA-C ED   07/08/2017 0011 07/08/2017 1922 Full Code [id number]  Flueckiger, Amedeo Kinsman, RN ED   07/07/2017 2328 07/08/2017 0011 Full Code [id number]  Doristine Devoid, PA-C ED   06/19/2017 0616 06/20/2017 1323 Full Code [id number]  Lorin Glass, PA-C ED   03/01/2017 1556 03/02/2017 1359 Full Code [id number]  Street, [place], [place] ED   01/05/2017 0000 01/05/2017 1547 Full Code [id number]  Ward, Delice Bison, DO ED   11/05/2016 1607 11/06/2016 1517 Full Code [id number]  Larene Pickett, PA-C ED   10/01/2016 0119 10/06/2016 1818 Full Code [id number]  McNichol, Fabio Asa, RN Inpatient   08/06/2016 1940 08/07/2016 1611 Full Code [id number]  Margarita Mail, PA-C ED   05/02/2016 2044 05/04/2016 1410 Full Code [id number]  Malvin Johns, MD ED   03/19/2016 1716 03/20/2016 1533 Full Code [id number]  Roxanna Mew, PA-C ED   02/02/2016 2333 02/03/2016 1444 Full Code [id number]  Lacretia Leigh, MD ED   07/18/2015 0158 07/23/2015 1515 Full Code [id number]  Laverle Hobby, PA-C Inpatient   07/17/2015 1520 07/18/2015 0158 Full Code [id number]  Sherwood Gambler, MD ED   05/09/2015 0307 05/14/2015 1834 Full Code [id number]  Laverle Hobby, PA-C Inpatient   05/08/2015 2202 05/09/2015 0307 Full Code [id number]  Merrily Pew, MD ED   05/08/2015 2159 05/08/2015 2202 Full Code [id number]  Merrily Pew, MD ED   02/20/2015 1455 02/21/2015 1507 Full Code [id number]  Kerrie Buffalo, NP Inpatient   02/19/2015 2317 02/20/2015 1455 Full Code [id number]  Charlesetta Shanks, MD ED   01/15/2015 1642 01/16/2015 1524 Full Code [id number]  Delfin Gant, NP Inpatient   01/14/2015 1349 01/15/2015 1641 Full Code [id number]  Orlie Dakin, MD ED   12/22/2014 1921 12/25/2014 1731 Full Code [id number]  Patrecia Pour, NP Inpatient   12/21/2014 1824 12/22/2014 1921 Full Code [id number]  Noemi Chapel, MD ED   12/20/2014 0010 12/20/2014 1401 Full Code [id number]  Rolland Porter, MD ED   04/11/2014 1929 04/12/2014 0200 Full Code [id number]  Carman Ching, PA-C ED   12/05/2013 0021 12/08/2013 1750 Full Code [id number]  Laverle Hobby, PA-C Inpatient   12/04/2013 1844 12/05/2013 0021 Full Code [id number]  Muthersbaugh, Gwenlyn Perking ED   06/03/2013 2352 06/05/2013 1525 Full Code [id number]  Antonietta Breach, PA-C ED   04/25/2013 1252 04/27/2013 1710 Full Code [id number]  Truett Mainland, DO Inpatient   06/27/2012 1954 06/28/2012 1411 Full Code [id number]  Dewaine Oats, PA-C ED         IV Access:   Peripheral IV   Procedures and diagnostic studies:   No results found.   Medical Consultants:   None.   Subjective:    [name] no difficulty breathing.  Objective:    Vitals:   03/26/21 2353 03/27/21 0338 03/27/21 0343 03/27/21 0713  BP: 129/86 (!) 126/54  (!)  167/102  Pulse: 84 86 86 97  Resp: 20 20  20   Temp: 97.9 F (36.6 C) 97.7 F (36.5 C)  97.8 F (36.6 C)  TempSrc: Axillary Axillary  Oral  SpO2: 98% 96% 97% 100%  Weight:  120.1 kg    SpO2: 100 % FiO2 (%): 40 %   Intake/Output Summary (Last 24 hours) at 03/27/2021 1043 Last data filed at 03/27/2021 0900 Gross per 24 hour  Intake 2230 ml  Output --  Net 2230 ml    Filed Weights   03/25/21 0500 03/26/21 0500 03/27/21 0343  Weight: 109.4 kg 108.8 kg 120.1 kg    Exam: General exam: In no acute distress. Respiratory system: Good air movement and crackles bilaterally. Cardiovascular system: S1 & S2 heard, RRR. No JVD. Gastrointestinal system: Abdomen is nondistended, soft and nontender.  Extremities: No pedal edema. Skin: No rashes, lesions or ulcers    Data Reviewed:    Labs: Basic Metabolic Panel: Recent Labs  Lab 03/23/21 1506 03/23/21 1623 03/23/21 2155 03/24/21 0046 03/24/21 0352 03/24/21 1710 03/25/21 0101 03/25/21 1609 03/26/21 0629  NA 137   < > 137 137 140  --  137  --  139  K 3.5   < > 4.1 3.9 3.6  --  3.6  --  3.6  CL 105  --  103 105  --   --  106  --  105  CO2 23  --  24 20*  --   --  22  --  24  GLUCOSE 258*  --  314* 283*  --   --  311*  --  185*  BUN 5*  --  <5* 6  --   --  13  --  9  CREATININE 0.75  --  0.82 0.85  --   --  0.92  --  0.68  CALCIUM 8.9  --  9.1 9.0  --   --  8.8*  --  8.7*  MG  --   --  2.2  --   --  2.1 1.9 2.2 1.8  PHOS  --   --   --   --   --  2.5 3.4 3.8 4.1   < > = values in this interval not displayed.    GFR Estimated Creatinine Clearance: 135.5 mL/min (by C-G formula based on SCr of 0.68 mg/dL). Liver Function Tests: Recent Labs  Lab 03/22/21 0025 03/22/21 0532 03/23/21 1506  AST 23 21 26   ALT 22 22 21   ALKPHOS 65 68 63  BILITOT 0.4 0.6 0.6  PROT 7.3 6.9 6.7  ALBUMIN 3.9 3.9 3.7    Recent Labs  Lab 03/22/21 0025  LIPASE 41    Recent Labs  Lab 03/24/21 0118  AMMONIA 43*    Coagulation  profile No results for input(s): INR, PROTIME in the last 168 hours. COVID-19 Labs  No results for input(s): DDIMER, FERRITIN, LDH, CRP in the last 72 hours.  Lab Results  Component Value Date   SARSCOV2NAA NEGATIVE 03/23/2021   [place] NEGATIVE 03/22/2021   [place] NEGATIVE 02/21/2021   [place] NEGATIVE 02/01/2021    CBC: Recent Labs  Lab 03/22/21 0025 03/22/21 0532 03/23/21 1451 03/23/21 1506 03/23/21 1623 03/24/21 0046 03/24/21 0352 03/25/21 0101 03/26/21 0629  WBC 14.0* 12.3*  --  9.6  --  13.4*  --  13.1* 10.7*  NEUTROABS 8.5* 7.2  --   --   --   --   --  8.7*  --   HGB 11.4* 11.3*   < > 11.4* 12.6 11.0* 11.2* 11.2* 11.2*  HCT 35.5* 35.3*   < > 36.6 37.0 33.9* 33.0* 35.1* 34.8*  MCV 80.7 80.8  --  83.2  --  80.0  --  81.1  80.9  PLT 308 280  --  216  --  275  --  219 145*   < > = values in this interval not displayed.    Cardiac Enzymes: No results for input(s): CKTOTAL, CKMB, CKMBINDEX, TROPONINI in the last 168 hours. BNP (last 3 results) No results for input(s): PROBNP in the last 8760 hours. CBG: Recent Labs  Lab 03/26/21 0727 03/26/21 1153 03/26/21 1625 03/26/21 2028 03/27/21 0759  GLUCAP 179* 200* 190* 216* 209*    D-Dimer: No results for input(s): DDIMER in the last 72 hours. Hgb A1c: No results for input(s): HGBA1C in the last 72 hours.  Lipid Profile: No results for input(s): CHOL, HDL, LDLCALC, TRIG, CHOLHDL, LDLDIRECT in the last 72 hours.  Thyroid function studies: No results for input(s): TSH, T4TOTAL, T3FREE, THYROIDAB in the last 72 hours.  Invalid input(s): FREET3  Anemia work up: No results for input(s): VITAMINB12, FOLATE, FERRITIN, TIBC, IRON, RETICCTPCT in the last 72 hours. Sepsis Labs: Recent Labs  Lab 03/23/21 1506 03/24/21 0046 03/25/21 0101 03/26/21 0629  WBC 9.6 13.4* 13.1* 10.7*    Microbiology Recent Results (from the past 240 hour(s))  Resp Panel by RT-PCR (Flu A&B, Covid) Nasopharyngeal Swab      Status: None   Collection Time: 03/22/21  5:04 AM   Specimen: Nasopharyngeal Swab; Nasopharyngeal(NP) swabs in vial transport medium  Result Value Ref Range Status   SARS Coronavirus 2 by RT PCR NEGATIVE NEGATIVE Final    Comment: (NOTE) SARS-CoV-2 target nucleic acids are NOT DETECTED.  The SARS-CoV-2 RNA is generally detectable in upper respiratory specimens during the acute phase of infection. The lowest concentration of SARS-CoV-2 viral copies this assay can detect is 138 copies/mL. A negative result does not preclude SARS-Cov-2 infection and should not be used as the sole basis for treatment or other patient management decisions. A negative result may occur with  improper specimen collection/handling, submission of specimen other than nasopharyngeal swab, presence of viral mutation(s) within the areas targeted by this assay, and inadequate number of viral copies(<138 copies/mL). A negative result must be combined with clinical observations, patient history, and epidemiological information. The expected result is Negative.  Fact Sheet for Patients:  EntrepreneurPulse.com.au  Fact Sheet for Healthcare Providers:  IncredibleEmployment.be  This test is no t yet approved or cleared by the [place] FDA and  has been authorized for detection and/or diagnosis of SARS-CoV-2 by FDA under an Emergency Use Authorization (EUA). This EUA will remain  in effect (meaning this test can be used) for the duration of the COVID-19 declaration under Section 564(b)(1) of the Act, 21 U.S.C.section 360bbb-3(b)(1), unless the authorization is terminated  or revoked sooner.       Influenza A by PCR NEGATIVE NEGATIVE Final   Influenza B by PCR NEGATIVE NEGATIVE Final    Comment: (NOTE) The Xpert Xpress SARS-CoV-2/FLU/RSV plus assay is intended as an aid in the diagnosis of influenza from Nasopharyngeal swab specimens and should not be used as a sole basis  for treatment. Nasal washings and aspirates are unacceptable for Xpert Xpress SARS-CoV-2/FLU/RSV testing.  Fact Sheet for Patients: EntrepreneurPulse.com.au  Fact Sheet for Healthcare Providers: IncredibleEmployment.be  This test is not yet approved or cleared by the [place] FDA and has been authorized for detection and/or diagnosis of SARS-CoV-2 by FDA under an Emergency Use Authorization (EUA). This EUA will remain in effect (meaning this test can be used) for the duration of the COVID-19 declaration under Section 564(b)(1) of the Act, 21  U.S.C. section 360bbb-3(b)(1), unless the authorization is terminated or revoked.  Performed at [facility], [place] [address], [place], [place] [address]   Resp Panel by RT-PCR (Flu A&B, Covid) Nasopharyngeal Swab     Status: None   Collection Time: 03/23/21  2:11 PM   Specimen: Nasopharyngeal Swab; Nasopharyngeal(NP) swabs in vial transport medium  Result Value Ref Range Status   SARS Coronavirus 2 by RT PCR NEGATIVE NEGATIVE Final    Comment: (NOTE) SARS-CoV-2 target nucleic acids are NOT DETECTED.  The SARS-CoV-2 RNA is generally detectable in upper respiratory specimens during the acute phase of infection. The lowest concentration of SARS-CoV-2 viral copies this assay can detect is 138 copies/mL. A negative result does not preclude SARS-Cov-2 infection and should not be used as the sole basis for treatment or other patient management decisions. A negative result may occur with  improper specimen collection/handling, submission of specimen other than nasopharyngeal swab, presence of viral mutation(s) within the areas targeted by this assay, and inadequate number of viral copies(<138 copies/mL). A negative result must be combined with clinical observations, patient history, and epidemiological information. The expected result is Negative.  Fact Sheet for Patients:   EntrepreneurPulse.com.au  Fact Sheet for Healthcare Providers:  IncredibleEmployment.be  This test is no t yet approved or cleared by the [place] FDA and  has been authorized for detection and/or diagnosis of SARS-CoV-2 by FDA under an Emergency Use Authorization (EUA). This EUA will remain  in effect (meaning this test can be used) for the duration of the COVID-19 declaration under Section 564(b)(1) of the Act, 21 U.S.C.section 360bbb-3(b)(1), unless the authorization is terminated  or revoked sooner.       Influenza A by PCR NEGATIVE NEGATIVE Final   Influenza B by PCR NEGATIVE NEGATIVE Final    Comment: (NOTE) The Xpert Xpress SARS-CoV-2/FLU/RSV plus assay is intended as an aid in the diagnosis of influenza from Nasopharyngeal swab specimens and should not be used as a sole basis for treatment. Nasal washings and aspirates are unacceptable for Xpert Xpress SARS-CoV-2/FLU/RSV testing.  Fact Sheet for Patients: EntrepreneurPulse.com.au  Fact Sheet for Healthcare Providers: IncredibleEmployment.be  This test is not yet approved or cleared by the [place] FDA and has been authorized for detection and/or diagnosis of SARS-CoV-2 by FDA under an Emergency Use Authorization (EUA). This EUA will remain in effect (meaning this test can be used) for the duration of the COVID-19 declaration under Section 564(b)(1) of the Act, 21 U.S.C. section 360bbb-3(b)(1), unless the authorization is terminated or revoked.  Performed at [facility], [place] [address], [place], [place] [address]   MRSA Next Gen by PCR, Nasal     Status: None   Collection Time: 03/23/21  5:23 PM   Specimen: Nasal Mucosa; Nasal Swab  Result Value Ref Range Status   MRSA by PCR Next Gen NOT DETECTED NOT DETECTED Final    Comment: (NOTE) The GeneXpert MRSA Assay (FDA approved for NASAL specimens only), is one component of a  comprehensive MRSA colonization surveillance program. It is not intended to diagnose MRSA infection nor to guide or monitor treatment for MRSA infections. Test performance is not FDA approved in patients less than 74 years old. Performed at [facility], [place] [address] [place], [place] [address]      Medications:    Chlorhexidine Gluconate Cloth  6 each Topical Daily   feeding supplement (GLUCERNA SHAKE)  237 mL Oral TID BM   haloperidol  5  mg Oral QHS   heparin injection (subcutaneous)  5,000 Units Subcutaneous Q8H   insulin aspart  0-15 Units Subcutaneous TID WC   insulin aspart  0-5 Units Subcutaneous QHS   insulin aspart  4 Units Subcutaneous TID WC   insulin detemir  40 Units Subcutaneous BID   metoprolol tartrate  12.5 mg Oral BID   multivitamin with minerals  1 tablet Oral Daily   Continuous Infusions:  promethazine (PHENERGAN) injection (IM or IVPB) 6.25 mg (03/26/21 2047)      LOS: 4 days   Charlynne Cousins  [facility]  03/27/2021, 10:43 AM

## 2021-03-27 NOTE — Consult Note (Signed)
[place] [facility]iatry Consult   Reason for Consult:  Intentional overdose Referring Physician:  [name], NP Patient Identification: [name] MRN:  [id number] Principal Diagnosis: Suicidal overdose [facility]) Diagnosis:  Principal Problem:   Suicidal overdose ([place]) Active Problems:   Intentional overdose [facility])  Assessment  [name] is a 30 y.o. female admitted medically for 03/23/2021  1:39 PM for an overdose on home medications. She was notably discharged from the [facility] shortly  She carries the psychiatric diagnoses of schizoaffective disorder (vs bipolar I) and borderline personality disorder and has a past medical history of cognitive deficits, diabetes, hypertension and obesity. Psychiatry was consulted for suicide attempt by [name] NP.   Patient displays very minimal improvement in judgement as she has remained abstinent from talking with her ex as suggested. Patient able to endorse better coping skills when upset; but denies that she practices these beyond talking to her mom. Patient more forward thinking today and endorses that her conversation with her mother was helpful for this. Patient remain appropriate for inpatient psych care to address medication and crisis stabilization.   Plan  ## Safety and Observation Level:  - Based on my clinical evaluation, I estimate the patient to be at high risk of self harm in the current setting - At this time, we recommend a 1:1 level of observation. This decision is based on my review of the chart including patient's history and current presentation, interview of the patient, mental status examination, and consideration of suicide risk including evaluating suicidal ideation, plan, intent, suicidal or self-harm behaviors, risk factors, and protective factors. This judgment is based on our ability to directly address suicide risk, implement suicide prevention strategies and develop a safety plan while the patient is in the  clinical setting. Please contact our team if there is a concern that risk level has changed.     ## Medications:  -- Continue haldol 5 mg Hold other home meds     ## Medical Decision Making Capacity:  Not formally assesssed   ## Further Work-up:  -- tsh wnl -- no vitamin B12, folate recorded - will rec tomorrow   ## Disposition:  -- Patient to transfer to [facility]   ##Legal Status -- currently voluntary, will IVC if tries to leave   Thank you for this consult request. Recommendations have been communicated to the primary team.  We will sign off at this time.      Total Time spent with patient: 15 minutes  Subjective:   [name] is a 30 y.o. female patient admitted with intentional overdose.  HPI:  On assessment today patient is resting comfortably but arouses to tactile stimulation easily. Patient has NT in room who endorses that patient had done well today. Patient denies that she has been communicating with her boyfriend and reports that she is feeling better overall. Patient reports that want has contributed most to her positive mood is her mother visiting last night. Patient reports that she had a good conversation with her mother. Patient denies SI, HI, and AVH.     Past Medical History:  Past Medical History:  Diagnosis Date   Anxiety    Bipolar 1 disorder ([place])    Cognitive deficits    Depression    Diabetes mellitus without complication ([place])    Hypertension    Mental disorder    Mental health disorder    Obesity     Past Surgical History:  Procedure Laterality Date   CESAREAN SECTION  CESAREAN SECTION N/A 04/25/2013   Procedure: REPEAT CESAREAN SECTION;  Surgeon: [name], MD;  Location: [facility];  Service: Obstetrics;  Laterality: N/A;   MASS EXCISION N/A 06/03/2012   Procedure: EXCISION MASS;  Surgeon: [name], MD;  Location: [place];  Service: ENT;  Laterality: N/A;  Excision uvula mass   TONSILLECTOMY N/A 06/03/2012    Procedure: TONSILLECTOMY;  Surgeon: [name], MD;  Location: [place];  Service: ENT;  Laterality: N/A;   TONSILLECTOMY     Family History:  Family History  Problem Relation Age of Onset   Hypertension Mother    Diabetes Father     Social History:  Social History   Substance and Sexual Activity  Alcohol Use Not Currently     Social History   Substance and Sexual Activity  Drug Use Not Currently   Types: "Crack" cocaine, Other-see comments   Comment: Patient reports hx of smoking Crack    Social History   Socioeconomic History   Marital status: Single    Spouse name: Not on file   Number of children: Not on file   Years of education: Not on file   Highest education level: Not on file  Occupational History   Not on file  Tobacco Use   Smoking status: Every Day    Types: Cigars   Smokeless tobacco: Never   Tobacco comments:    Pt declined  Vaping Use   Vaping Use: Never used  Substance and Sexual Activity   Alcohol use: Not Currently   Drug use: Not Currently    Types: "Crack" cocaine, Other-see comments    Comment: Patient reports hx of smoking Crack   Sexual activity: Not Currently    Birth control/protection: None    Comment: occasionally  Other Topics Concern   Not on file  Social History Narrative   ** Merged History Encounter **       Social Determinants of Health   Financial Resource Strain: Not on file  Food Insecurity: Not on file  Transportation Needs: Not on file  Physical Activity: Not on file  Stress: Not on file  Social Connections: Not on file   Additional Social History:    Allergies:   Allergies  Allergen Reactions   Wellbutrin [Bupropion] Shortness Of Breath   Omnipaque [Iohexol] Swelling and Other (See Comments)    Eye swelling   Contrast Media [Iodinated Diagnostic Agents]     Eyes swell up   Depakote [Divalproex Sodium]     Mouth sores. dizziness   Hydroxyzine     Causes hyperactivity, makes pt  want to "fight"   Penicillin G Hives    Labs:  Results for orders placed or performed during the hospital encounter of 03/23/21 (from the past 48 hour(s))  Glucose, capillary     Status: Abnormal   Collection Time: 03/25/21  3:23 PM  Result Value Ref Range   Glucose-Capillary 154 (H) 70 - 99 mg/dL    Comment: Glucose reference range applies only to samples taken after fasting for at least 8 hours.  Magnesium     Status: None   Collection Time: 03/25/21  4:09 PM  Result Value Ref Range   Magnesium 2.2 1.7 - 2.4 mg/dL    Comment: Performed at [facility], [place] [address], [place], [place] [address]  Phosphorus     Status: None   Collection Time: 03/25/21  4:09 PM  Result Value Ref Range   Phosphorus 3.8 2.5 -  4.6 mg/dL    Comment: Performed at [facility], [place] [address], [place], [place] [address]  Glucose, capillary     Status: Abnormal   Collection Time: 03/25/21  7:47 PM  Result Value Ref Range   Glucose-Capillary 212 (H) 70 - 99 mg/dL    Comment: Glucose reference range applies only to samples taken after fasting for at least 8 hours.  Glucose, capillary     Status: Abnormal   Collection Time: 03/25/21 11:56 PM  Result Value Ref Range   Glucose-Capillary 194 (H) 70 - 99 mg/dL    Comment: Glucose reference range applies only to samples taken after fasting for at least 8 hours.  Glucose, capillary     Status: Abnormal   Collection Time: 03/26/21  3:35 AM  Result Value Ref Range   Glucose-Capillary 172 (H) 70 - 99 mg/dL    Comment: Glucose reference range applies only to samples taken after fasting for at least 8 hours.  Magnesium     Status: None   Collection Time: 03/26/21  6:29 AM  Result Value Ref Range   Magnesium 1.8 1.7 - 2.4 mg/dL    Comment: Performed at [facility] [address], [place], [place] [address]  Phosphorus     Status: None   Collection Time: 03/26/21  6:29 AM  Result Value Ref Range   Phosphorus 4.1 2.5 - 4.6 mg/dL    Comment:  Performed at [facility] [address], [place], [place] [address]  Basic metabolic panel     Status: Abnormal   Collection Time: 03/26/21  6:29 AM  Result Value Ref Range   Sodium 139 135 - 145 mmol/L   Potassium 3.6 3.5 - 5.1 mmol/L   Chloride 105 98 - 111 mmol/L   CO2 24 22 - 32 mmol/L   Glucose, Bld 185 (H) 70 - 99 mg/dL    Comment: Glucose reference range applies only to samples taken after fasting for at least 8 hours.   BUN 9 6 - 20 mg/dL   Creatinine, Ser 0.68 0.44 - 1.00 mg/dL   Calcium 8.7 (L) 8.9 - 10.3 mg/dL   GFR, Estimated >60 >60 mL/min    Comment: (NOTE) Calculated using the CKD-EPI Creatinine Equation (2021)    Anion gap 10 5 - 15    Comment: Performed at [facility] [address] [place], [place] [address]  CBC     Status: Abnormal   Collection Time: 03/26/21  6:29 AM  Result Value Ref Range   WBC 10.7 (H) 4.0 - 10.5 K/uL   RBC 4.30 3.87 - 5.11 MIL/uL   Hemoglobin 11.2 (L) 12.0 - 15.0 g/dL   HCT 34.8 (L) 36.0 - 46.0 %   MCV 80.9 80.0 - 100.0 fL   MCH 26.0 26.0 - 34.0 pg   MCHC 32.2 30.0 - 36.0 g/dL   RDW 15.5 11.5 - 15.5 %   Platelets 145 (L) 150 - 400 K/uL    Comment: REPEATED TO VERIFY PLATELET COUNT CONFIRMED BY SMEAR    nRBC 0.0 0.0 - 0.2 %    Comment: Performed at [facility], [place] [address], [place], [place] [address]  Glucose, capillary     Status: Abnormal   Collection Time: 03/26/21  7:27 AM  Result Value Ref Range   Glucose-Capillary 179 (H) 70 - 99 mg/dL    Comment: Glucose reference range applies only to samples taken after fasting for at least 8  hours.  Glucose, capillary     Status: Abnormal   Collection Time: 03/26/21 11:53 AM  Result Value Ref Range   Glucose-Capillary 200 (H) 70 - 99 mg/dL    Comment: Glucose reference range applies only to samples taken after fasting for at least 8 hours.  RPR     Status: None   Collection Time: 03/26/21  2:32 PM  Result Value Ref Range   RPR Ser Ql NON REACTIVE NON  REACTIVE    Comment: Performed at [facility], [place] [address], [place], [place] [address]  Glucose, capillary     Status: Abnormal   Collection Time: 03/26/21  4:25 PM  Result Value Ref Range   Glucose-Capillary 190 (H) 70 - 99 mg/dL    Comment: Glucose reference range applies only to samples taken after fasting for at least 8 hours.  Glucose, capillary     Status: Abnormal   Collection Time: 03/26/21  8:28 PM  Result Value Ref Range   Glucose-Capillary 216 (H) 70 - 99 mg/dL    Comment: Glucose reference range applies only to samples taken after fasting for at least 8 hours.  Glucose, capillary     Status: Abnormal   Collection Time: 03/27/21  7:59 AM  Result Value Ref Range   Glucose-Capillary 209 (H) 70 - 99 mg/dL    Comment: Glucose reference range applies only to samples taken after fasting for at least 8 hours.  Glucose, capillary     Status: Abnormal   Collection Time: 03/27/21 12:37 PM  Result Value Ref Range   Glucose-Capillary 200 (H) 70 - 99 mg/dL    Comment: Glucose reference range applies only to samples taken after fasting for at least 8 hours.    Current Facility-Administered Medications  Medication Dose Route Frequency Provider Last Rate Last Admin   Chlorhexidine Gluconate Cloth 2 % PADS 6 each  6 each Topical Daily Hunsucker, Bonna Gains, MD   6 each at 03/26/21 0952   feeding supplement (GLUCERNA SHAKE) (GLUCERNA SHAKE) liquid 237 mL  237 mL Oral TID BM Charlynne Cousins, MD       haloperidol (HALDOL) tablet 5 mg  5 mg Oral QHS Hunsucker, Bonna Gains, MD   5 mg at 03/26/21 2204   heparin injection 5,000 Units  5,000 Units Subcutaneous Q8H Hunsucker, Bonna Gains, MD   5,000 Units at 03/27/21 0531   insulin aspart (novoLOG) injection 0-15 Units  0-15 Units Subcutaneous TID WC Charlynne Cousins, MD   3 Units at 03/27/21 1243   insulin aspart (novoLOG) injection 0-5 Units  0-5 Units Subcutaneous QHS Charlynne Cousins, MD   5 Units at 03/26/21 2044   insulin  aspart (novoLOG) injection 4 Units  4 Units Subcutaneous TID WC Charlynne Cousins, MD   4 Units at 03/27/21 1243   insulin detemir (LEVEMIR) injection 40 Units  40 Units Subcutaneous BID Hunsucker, Bonna Gains, MD   40 Units at 03/27/21 1244   LORazepam (ATIVAN) injection 2 mg  2 mg Intravenous Q6H PRN Tilden Dome, MD   2 mg at 03/27/21 1113   metoprolol tartrate (LOPRESSOR) tablet 12.5 mg  12.5 mg Oral BID Charlynne Cousins, MD   12.5 mg at 03/27/21 7654   multivitamin with minerals tablet 1 tablet  1 tablet Oral Daily Charlynne Cousins, MD   1 tablet at 03/27/21 0953   promethazine (PHENERGAN) tablet 12.5 mg  12.5 mg Oral Q6H PRN Charlynne Cousins, MD  Or   promethazine (PHENERGAN) 6.25 mg in sodium chloride 0.9 % 50 mL IVPB  6.25 mg Intravenous Q6H PRN Charlynne Cousins, MD 200 mL/hr at 03/26/21 2047 6.25 mg at 03/26/21 2047   Or   promethazine (PHENERGAN) suppository 12.5 mg  12.5 mg Rectal Q6H PRN Charlynne Cousins, MD       Psychiatric Specialty Exam:  Presentation  General Appearance: Appropriate for Environment  Eye Contact:Fair  Speech:Clear and Coherent  Speech Volume:Normal  Handedness:Right   Mood and Affect  Mood:Euthymic  Affect:Restricted   Thought Process  Thought Processes:Linear  Descriptions of Associations:Circumstantial  Orientation:Full (Time, Place and Person)  Thought Content:Logical  History of Schizophrenia/Schizoaffective disorder:Yes  Duration of Psychotic Symptoms:N/A  Hallucinations:Hallucinations: None  Ideas of Reference:None  Suicidal Thoughts:Suicidal Thoughts: No SI Passive Intent and/or Plan: Without Means to Carry Out  Homicidal Thoughts:Homicidal Thoughts: No   Sensorium  Memory:Immediate Fair; Recent Fair  Judgment:-- (Improving)  Insight:None   Executive Functions  Concentration:Fair  Attention Span:Fair  [place] of Knowledge:Poor  Language:Fair   Psychomotor Activity   Psychomotor Activity:Psychomotor Activity: Decreased   Assets  Assets:Resilience   Sleep  Sleep:Sleep: Good   Physical Exam: Physical Exam HENT:     Head: Normocephalic and atraumatic.  Eyes:     Extraocular Movements: Extraocular movements intact.  Pulmonary:     Effort: Pulmonary effort is normal.  Neurological:     Mental Status: She is alert and oriented to person, place, and time.   Review of Systems  Psychiatric/Behavioral:  Negative for hallucinations and suicidal ideas.   Blood pressure 128/66, pulse 98, temperature 98.4 F (36.9 C), temperature source Oral, resp. rate 20, weight 120.1 kg, SpO2 98 %. Body mass index is 41.47 kg/m.  PGY-2  Freida Busman, MD 03/27/2021 12:57 PM

## 2021-03-27 NOTE — TOC Progression Note (Signed)
Transition of Care [facility]) - Progression Note    Patient Details  Name: [name] MRN: [id number] Date of Birth: Mar 15, 1991  Transition of Care [facility]) CM/SW [place], LCSW Phone Number: 03/27/2021, 5:18 PM  Clinical Narrative:    If patient is being discharged today, RN please call report to [phone] or [phone] (if no answer try [phone]). Note it is difficult to get them to answer the phone. Patient going to [facility]. Accepting MD is [employer].   Please fax discharge summary to f. [phone] (already sent MAR and covid test).   For voluntary transport, [facility] [phone].    Expected Discharge Plan: Psychiatric Hospital Barriers to Discharge: No Barriers Identified  Expected Discharge Plan and Services Expected Discharge Plan: Psychiatric Hospital In-house Referral: Clinical Social Work   Post Acute Care Choice:  (IP Psych) Living arrangements for the past 2 months: Apartment                                       Social Determinants of Health (SDOH) Interventions    Readmission Risk Interventions No flowsheet data found.

## 2021-03-27 NOTE — TOC Progression Note (Signed)
Transition of Care [facility]) - Progression Note    Patient Details  Name: [name] MRN: [id number] Date of Birth: Feb 12, 1991  Transition of Care [facility]) CM/SW [place], [place] Phone Number: 03/27/2021, 12:50 PM  Clinical Narrative:    [facility] able to accept patient today on their [place] if medically stable. CSW will fax over DC Summary, Covid test from 11/27, and MAR to f. [phone]. They can accept anytime today; MD is [employer]. Report to be called to [phone] or [phone].   Expected Discharge Plan: Psychiatric Hospital Barriers to Discharge: No Barriers Identified  Expected Discharge Plan and Services Expected Discharge Plan: Psychiatric Hospital In-house Referral: Clinical Social Work   Post Acute Care Choice:  (IP Psych) Living arrangements for the past 2 months: Apartment                                       Social Determinants of Health (SDOH) Interventions    Readmission Risk Interventions No flowsheet data found.

## 2021-03-28 DIAGNOSIS — T50902A Poisoning by unspecified drugs, medicaments and biological substances, intentional self-harm, initial encounter: Secondary | ICD-10-CM | POA: Diagnosis not present

## 2021-03-28 DIAGNOSIS — T50902S Poisoning by unspecified drugs, medicaments and biological substances, intentional self-harm, sequela: Secondary | ICD-10-CM | POA: Diagnosis not present

## 2021-03-28 LAB — GLUCOSE, CAPILLARY
Glucose-Capillary: 160 mg/dL — ABNORMAL HIGH (ref 70–99)
Glucose-Capillary: 198 mg/dL — ABNORMAL HIGH (ref 70–99)

## 2021-03-28 MED ORDER — METOPROLOL TARTRATE 25 MG PO TABS: 12.5000 mg | ORAL_TABLET | Freq: Two times a day (BID) | ORAL | Status: DC

## 2021-03-28 NOTE — Progress Notes (Signed)
1050: Report given to Charlena Cross, Therapist, sports at [facility]. No questions or concerns at this time. They are ready to admit patient. Awaiting final discharge note from MD.

## 2021-03-28 NOTE — TOC Transition Note (Signed)
Transition of Care [facility]) - CM/SW Discharge Note   Patient Details  Name: [name] MRN: [id number] Date of Birth: 1990-04-29  Transition of Care [facility]) CM/SW Contact:  Benard Halsted, LCSW Phone Number: 03/28/2021, 12:28 PM   Clinical Narrative:    CSW faxed DC Summary to [facility] and confirmed they can accept patient today. CSW spoke with [facility] transportation and submitted the Ride over [place] form. They will contact [facility] to pick patient up at the [facility] tower entrance and they will call RN upon arrival.    Final next level of care: Psychiatric Hospital Barriers to Discharge: No Barriers Identified   Patient Goals and CMS Choice Patient states their goals for this hospitalization and ongoing recovery are:: stabalization      Discharge Placement                       Discharge Plan and Services In-house Referral: Clinical Social Work   Post Acute Care Choice:  (IP Psych)                               Social Determinants of Health (SDOH) Interventions     Readmission Risk Interventions No flowsheet data found.

## 2021-03-28 NOTE — Discharge Summary (Signed)
Physician Discharge Summary  [name] GYK:[id number] DOB: 07-01-1990 DOA: 03/23/2021  PCP: Trey Sailors, PA  Admit date: 03/23/2021 Discharge date: 03/28/2021  Admitted From: Home Disposition: Inpatient psychiatric facility [facility]  Recommendations for Outpatient Follow-up:  Follow up with PCP in 1-2 weeks Please obtain BMP/CBC in one week   Home Health:No Equipment/Devices:none  Discharge Condition:Stable CODE STATUS:Full Diet recommendation: Heart Healthy  Brief/Interim Summary: 30 y.o. female past medical history of bipolar disorder, schizophrenia diabetes mellitus type 2 recently discharged from [facility] 4 to 6 weeks ago was brought in by EMS for intentional overdose recently released from [facility] after family dispute with her family and boyfriend verbalized homicidal ideations to her boyfriend and ODD on 03/22/2021, she was intubated due to agitation  Discharge Diagnoses:  Principal Problem:   Suicidal overdose ([place]) Active Problems:   Intentional overdose ([place])  Acute toxic encephalopathy due to intentional drug overdose with a prolonged QTC in the setting of bipolar disorder/schizophrenia/major depressive disorder: Initially admitted by PCCM and intubated, subsequently extubated psych was consulted who recommended inpatient psychiatric facility she was placed on Haldol.  Intramuscular for agitation. Her home medications were resumed.  SIRS/mild leukocytosis with left shift:  has remained afebrile normotensive is in sinus rhythm. [place] due to intentional drug overdose.  Acute respiratory failure on Intubated currently extubated requiring 2 to 4 L was weaned to room air. Satting greater 90%.  Essential hypertension: Continue current home regimen no changes made.  Diabetes mellitus type 2 with hyperglycemia: With an A1c of 8.8 no changes made to her medication she will continue her current home regimen.   Discharge Instructions  Discharge  Instructions     Diet - low sodium heart healthy   Complete by: As directed    Increase activity slowly   Complete by: As directed       Allergies as of 03/28/2021       Reactions   Wellbutrin [bupropion] Shortness Of Breath   Omnipaque [iohexol] Swelling, Other (See Comments)   Eye swelling   Contrast Media [iodinated Diagnostic Agents]    Eyes swell up   Depakote [divalproex Sodium]    Mouth sores. dizziness   Hydroxyzine    Causes hyperactivity, makes pt want to "fight"   Penicillin G Hives        Medication List     STOP taking these medications    fluticasone 50 MCG/ACT nasal spray Commonly known as: FLONASE   metroNIDAZOLE 500 MG tablet Commonly known as: Flagyl       TAKE these medications    albuterol 108 (90 Base) MCG/ACT inhaler Commonly known as: VENTOLIN HFA Inhale 1-2 puffs into the lungs every 6 (six) hours as needed for wheezing or shortness of breath.   amLODipine 5 MG tablet Commonly known as: NORVASC Take 5 mg by mouth daily.   cholecalciferol 25 MCG (1000 UNIT) tablet Commonly known as: VITAMIN D Take 1,000 Units by mouth daily.   glimepiride 4 MG tablet Commonly known as: AMARYL Take 4 mg by mouth daily.   guanFACINE 1 MG Tb24 ER tablet Commonly known as: INTUNIV Take 1 mg by mouth at bedtime.   haloperidol 10 MG tablet Commonly known as: HALDOL Take 10-20 mg by mouth See admin instructions. 10 mg in the morning  20 mg at bedtime   haloperidol decanoate 100 MG/ML injection Commonly known as: HALDOL DECANOATE Inject 150 mg into the muscle every 28 (twenty-eight) days. Inject 150mg  (1.83ml) every 28 days.   hydrOXYzine  50 MG capsule Commonly known as: VISTARIL Take 50 mg by mouth at bedtime as needed for anxiety.   insulin detemir 100 UNIT/ML injection Commonly known as: LEVEMIR Inject 0.4 mLs (40 Units total) into the skin 2 (two) times daily.   lithium carbonate 450 MG CR tablet Commonly known as: ESKALITH Take 450 mg  by mouth 2 (two) times daily.   metoprolol tartrate 25 MG tablet Commonly known as: LOPRESSOR Take 0.5 tablets (12.5 mg total) by mouth 2 (two) times daily.   OLANZapine zydis 10 MG disintegrating tablet Commonly known as: ZYPREXA Take 10 mg by mouth every 8 (eight) hours as needed (agitation).   omeprazole 20 MG capsule Commonly known as: PRILOSEC Take 20 mg by mouth daily.   primidone 50 MG tablet Commonly known as: MYSOLINE Take 100 mg by mouth 3 (three) times daily.   sertraline 50 MG tablet Commonly known as: ZOLOFT Take 50 mg by mouth daily.   simvastatin 20 MG tablet Commonly known as: ZOCOR Take 20 mg by mouth daily.   vitamin B-12 1000 MCG tablet Commonly known as: CYANOCOBALAMIN Take 1,000 mcg by mouth daily.        Allergies  Allergen Reactions   Wellbutrin [Bupropion] Shortness Of Breath   Omnipaque [Iohexol] Swelling and Other (See Comments)    Eye swelling   Contrast Media [Iodinated Diagnostic Agents]     Eyes swell up   Depakote [Divalproex Sodium]     Mouth sores. dizziness   Hydroxyzine     Causes hyperactivity, makes pt want to "fight"   Penicillin G Hives    Consultations: Psyq PCCM  Procedures/Studies: DG Chest Port 1 View  Result Date: 03/23/2021 CLINICAL DATA:  Intubation EXAM: PORTABLE CHEST 1 VIEW COMPARISON:  Chest x-ray 05/04/2020 FINDINGS: Endotracheal tube tip is 2.5 cm above the carina. Enteric tube tip is below the diaphragm. Heart size is likely normal. Mild low lung volumes. No focal consolidation identified. No pleural effusion or pneumothorax. IMPRESSION: Medical devices as described.  No focal consolidations identified. Electronically Signed   By: Ofilia Neas M.D.   On: 03/23/2021 15:48    Subjective: No complaints  Discharge Exam: Vitals:   03/28/21 0713 03/28/21 1126  BP: (!) 149/79 (!) 160/87  Pulse: 93 96  Resp: 20 20  Temp: 98 F (36.7 C) 97.7 F (36.5 C)  SpO2: 98% 99%   Vitals:   03/28/21 0339  03/28/21 0500 03/28/21 0713 03/28/21 1126  BP: (!) 148/88  (!) 149/79 (!) 160/87  Pulse: (!) 101  93 96  Resp: 20  20 20   Temp: 97.9 F (36.6 C)  98 F (36.7 C) 97.7 F (36.5 C)  TempSrc: Oral  Axillary Axillary  SpO2: 96%  98% 99%  Weight:  118.4 kg      General: Pt is alert, awake, not in acute distress Cardiovascular: RRR, S1/S2 +, no rubs, no gallops Respiratory: CTA bilaterally, no wheezing, no rhonchi Abdominal: Soft, NT, ND, bowel sounds + Extremities: no edema, no cyanosis    The results of significant diagnostics from this hospitalization (including imaging, microbiology, ancillary and laboratory) are listed below for reference.     Microbiology: Recent Results (from the past 240 hour(s))  Resp Panel by RT-PCR (Flu A&B, Covid) Nasopharyngeal Swab     Status: None   Collection Time: 03/22/21  5:04 AM   Specimen: Nasopharyngeal Swab; Nasopharyngeal(NP) swabs in vial transport medium  Result Value Ref Range Status   SARS Coronavirus 2 by RT PCR NEGATIVE NEGATIVE  Final    Comment: (NOTE) SARS-CoV-2 target nucleic acids are NOT DETECTED.  The SARS-CoV-2 RNA is generally detectable in upper respiratory specimens during the acute phase of infection. The lowest concentration of SARS-CoV-2 viral copies this assay can detect is 138 copies/mL. A negative result does not preclude SARS-Cov-2 infection and should not be used as the sole basis for treatment or other patient management decisions. A negative result may occur with  improper specimen collection/handling, submission of specimen other than nasopharyngeal swab, presence of viral mutation(s) within the areas targeted by this assay, and inadequate number of viral copies(<138 copies/mL). A negative result must be combined with clinical observations, patient history, and epidemiological information. The expected result is Negative.  Fact Sheet for Patients:  EntrepreneurPulse.com.au  Fact Sheet for  Healthcare Providers:  IncredibleEmployment.be  This test is no t yet approved or cleared by the [place] FDA and  has been authorized for detection and/or diagnosis of SARS-CoV-2 by FDA under an Emergency Use Authorization (EUA). This EUA will remain  in effect (meaning this test can be used) for the duration of the COVID-19 declaration under Section 564(b)(1) of the Act, 21 U.S.C.section 360bbb-3(b)(1), unless the authorization is terminated  or revoked sooner.       Influenza A by PCR NEGATIVE NEGATIVE Final   Influenza B by PCR NEGATIVE NEGATIVE Final    Comment: (NOTE) The Xpert Xpress SARS-CoV-2/FLU/RSV plus assay is intended as an aid in the diagnosis of influenza from Nasopharyngeal swab specimens and should not be used as a sole basis for treatment. Nasal washings and aspirates are unacceptable for Xpert Xpress SARS-CoV-2/FLU/RSV testing.  Fact Sheet for Patients: EntrepreneurPulse.com.au  Fact Sheet for Healthcare Providers: IncredibleEmployment.be  This test is not yet approved or cleared by the [place] FDA and has been authorized for detection and/or diagnosis of SARS-CoV-2 by FDA under an Emergency Use Authorization (EUA). This EUA will remain in effect (meaning this test can be used) for the duration of the COVID-19 declaration under Section 564(b)(1) of the Act, 21 U.S.C. section 360bbb-3(b)(1), unless the authorization is terminated or revoked.  Performed at [facility], [place] [address], [place], [place] [address]   Resp Panel by RT-PCR (Flu A&B, Covid) Nasopharyngeal Swab     Status: None   Collection Time: 03/23/21  2:11 PM   Specimen: Nasopharyngeal Swab; Nasopharyngeal(NP) swabs in vial transport medium  Result Value Ref Range Status   SARS Coronavirus 2 by RT PCR NEGATIVE NEGATIVE Final    Comment: (NOTE) SARS-CoV-2 target nucleic acids are NOT DETECTED.  The SARS-CoV-2 RNA is  generally detectable in upper respiratory specimens during the acute phase of infection. The lowest concentration of SARS-CoV-2 viral copies this assay can detect is 138 copies/mL. A negative result does not preclude SARS-Cov-2 infection and should not be used as the sole basis for treatment or other patient management decisions. A negative result may occur with  improper specimen collection/handling, submission of specimen other than nasopharyngeal swab, presence of viral mutation(s) within the areas targeted by this assay, and inadequate number of viral copies(<138 copies/mL). A negative result must be combined with clinical observations, patient history, and epidemiological information. The expected result is Negative.  Fact Sheet for Patients:  EntrepreneurPulse.com.au  Fact Sheet for Healthcare Providers:  IncredibleEmployment.be  This test is no t yet approved or cleared by the [place] FDA and  has been authorized for detection and/or diagnosis of SARS-CoV-2 by FDA under an Emergency Use Authorization (EUA). This EUA  will remain  in effect (meaning this test can be used) for the duration of the COVID-19 declaration under Section 564(b)(1) of the Act, 21 U.S.C.section 360bbb-3(b)(1), unless the authorization is terminated  or revoked sooner.       Influenza A by PCR NEGATIVE NEGATIVE Final   Influenza B by PCR NEGATIVE NEGATIVE Final    Comment: (NOTE) The Xpert Xpress SARS-CoV-2/FLU/RSV plus assay is intended as an aid in the diagnosis of influenza from Nasopharyngeal swab specimens and should not be used as a sole basis for treatment. Nasal washings and aspirates are unacceptable for Xpert Xpress SARS-CoV-2/FLU/RSV testing.  Fact Sheet for Patients: EntrepreneurPulse.com.au  Fact Sheet for Healthcare Providers: IncredibleEmployment.be  This test is not yet approved or cleared by the [place] FDA and has been authorized for detection and/or diagnosis of SARS-CoV-2 by FDA under an Emergency Use Authorization (EUA). This EUA will remain in effect (meaning this test can be used) for the duration of the COVID-19 declaration under Section 564(b)(1) of the Act, 21 U.S.C. section 360bbb-3(b)(1), unless the authorization is terminated or revoked.  Performed at [facility], [place] [address] [place], [place] [address]   MRSA Next Gen by PCR, Nasal     Status: None   Collection Time: 03/23/21  5:23 PM   Specimen: Nasal Mucosa; Nasal Swab  Result Value Ref Range Status   MRSA by PCR Next Gen NOT DETECTED NOT DETECTED Final    Comment: (NOTE) The GeneXpert MRSA Assay (FDA approved for NASAL specimens only), is one component of a comprehensive MRSA colonization surveillance program. It is not intended to diagnose MRSA infection nor to guide or monitor treatment for MRSA infections. Test performance is not FDA approved in patients less than 23 years old. Performed at [facility], [place] [address] [place], [place] [address]      Labs: BNP (last 3 results) No results for input(s): BNP in the last 8760 hours. Basic Metabolic Panel: Recent Labs  Lab 03/23/21 1506 03/23/21 1623 03/23/21 2155 03/24/21 0046 03/24/21 0352 03/24/21 1710 03/25/21 0101 03/25/21 1609 03/26/21 0629  NA 137   < > 137 137 140  --  137  --  139  K 3.5   < > 4.1 3.9 3.6  --  3.6  --  3.6  CL 105  --  103 105  --   --  106  --  105  CO2 23  --  24 20*  --   --  22  --  24  GLUCOSE 258*  --  314* 283*  --   --  311*  --  185*  BUN 5*  --  <5* 6  --   --  13  --  9  CREATININE 0.75  --  0.82 0.85  --   --  0.92  --  0.68  CALCIUM 8.9  --  9.1 9.0  --   --  8.8*  --  8.7*  MG  --   --  2.2  --   --  2.1 1.9 2.2 1.8  PHOS  --   --   --   --   --  2.5 3.4 3.8 4.1   < > = values in this interval not displayed.   Liver Function Tests: Recent Labs  Lab 03/22/21 0025 03/22/21 0532  03/23/21 1506  AST 23 21 26   ALT 22 22 21   ALKPHOS 65 68 63  BILITOT 0.4 0.6 0.6  PROT  7.3 6.9 6.7  ALBUMIN 3.9 3.9 3.7   Recent Labs  Lab 03/22/21 0025  LIPASE 41   Recent Labs  Lab 03/24/21 0118  AMMONIA 43*   CBC: Recent Labs  Lab 03/22/21 0025 03/22/21 0532 03/23/21 1451 03/23/21 1506 03/23/21 1623 03/24/21 0046 03/24/21 0352 03/25/21 0101 03/26/21 0629  WBC 14.0* 12.3*  --  9.6  --  13.4*  --  13.1* 10.7*  NEUTROABS 8.5* 7.2  --   --   --   --   --  8.7*  --   HGB 11.4* 11.3*   < > 11.4* 12.6 11.0* 11.2* 11.2* 11.2*  HCT 35.5* 35.3*   < > 36.6 37.0 33.9* 33.0* 35.1* 34.8*  MCV 80.7 80.8  --  83.2  --  80.0  --  81.1 80.9  PLT 308 280  --  216  --  275  --  219 145*   < > = values in this interval not displayed.   Cardiac Enzymes: No results for input(s): CKTOTAL, CKMB, CKMBINDEX, TROPONINI in the last 168 hours. BNP: Invalid input(s): POCBNP CBG: Recent Labs  Lab 03/27/21 0759 03/27/21 1237 03/27/21 1548 03/27/21 2026 03/28/21 0801  GLUCAP 209* 200* 164* 240* 160*   D-Dimer No results for input(s): DDIMER in the last 72 hours. Hgb A1c No results for input(s): HGBA1C in the last 72 hours. Lipid Profile No results for input(s): CHOL, HDL, LDLCALC, TRIG, CHOLHDL, LDLDIRECT in the last 72 hours. Thyroid function studies No results for input(s): TSH, T4TOTAL, T3FREE, THYROIDAB in the last 72 hours.  Invalid input(s): FREET3 Anemia work up No results for input(s): VITAMINB12, FOLATE, FERRITIN, TIBC, IRON, RETICCTPCT in the last 72 hours. Urinalysis    Component Value Date/Time   COLORURINE YELLOW 03/23/2021 1714   APPEARANCEUR CLEAR 03/23/2021 1714   LABSPEC 1.035 (H) 03/23/2021 1714   PHURINE 5.0 03/23/2021 1714   GLUCOSEU >=500 (A) 03/23/2021 1714   HGBUR NEGATIVE 03/23/2021 1714   BILIRUBINUR NEGATIVE 03/23/2021 1714   KETONESUR 5 (A) 03/23/2021 1714   PROTEINUR NEGATIVE 03/23/2021 1714   UROBILINOGEN 0.2 01/28/2021 1747   NITRITE  NEGATIVE 03/23/2021 1714   LEUKOCYTESUR NEGATIVE 03/23/2021 1714   Sepsis Labs Invalid input(s): PROCALCITONIN,  WBC,  LACTICIDVEN Microbiology Recent Results (from the past 240 hour(s))  Resp Panel by RT-PCR (Flu A&B, Covid) Nasopharyngeal Swab     Status: None   Collection Time: 03/22/21  5:04 AM   Specimen: Nasopharyngeal Swab; Nasopharyngeal(NP) swabs in vial transport medium  Result Value Ref Range Status   SARS Coronavirus 2 by RT PCR NEGATIVE NEGATIVE Final    Comment: (NOTE) SARS-CoV-2 target nucleic acids are NOT DETECTED.  The SARS-CoV-2 RNA is generally detectable in upper respiratory specimens during the acute phase of infection. The lowest concentration of SARS-CoV-2 viral copies this assay can detect is 138 copies/mL. A negative result does not preclude SARS-Cov-2 infection and should not be used as the sole basis for treatment or other patient management decisions. A negative result may occur with  improper specimen collection/handling, submission of specimen other than nasopharyngeal swab, presence of viral mutation(s) within the areas targeted by this assay, and inadequate number of viral copies(<138 copies/mL). A negative result must be combined with clinical observations, patient history, and epidemiological information. The expected result is Negative.  Fact Sheet for Patients:  EntrepreneurPulse.com.au  Fact Sheet for Healthcare Providers:  IncredibleEmployment.be  This test is no t yet approved or cleared by the [place] FDA and  has been  authorized for detection and/or diagnosis of SARS-CoV-2 by FDA under an Emergency Use Authorization (EUA). This EUA will remain  in effect (meaning this test can be used) for the duration of the COVID-19 declaration under Section 564(b)(1) of the Act, 21 U.S.C.section 360bbb-3(b)(1), unless the authorization is terminated  or revoked sooner.       Influenza A by PCR NEGATIVE  NEGATIVE Final   Influenza B by PCR NEGATIVE NEGATIVE Final    Comment: (NOTE) The Xpert Xpress SARS-CoV-2/FLU/RSV plus assay is intended as an aid in the diagnosis of influenza from Nasopharyngeal swab specimens and should not be used as a sole basis for treatment. Nasal washings and aspirates are unacceptable for Xpert Xpress SARS-CoV-2/FLU/RSV testing.  Fact Sheet for Patients: EntrepreneurPulse.com.au  Fact Sheet for Healthcare Providers: IncredibleEmployment.be  This test is not yet approved or cleared by the [place] FDA and has been authorized for detection and/or diagnosis of SARS-CoV-2 by FDA under an Emergency Use Authorization (EUA). This EUA will remain in effect (meaning this test can be used) for the duration of the COVID-19 declaration under Section 564(b)(1) of the Act, 21 U.S.C. section 360bbb-3(b)(1), unless the authorization is terminated or revoked.  Performed at [facility], [place] [address], [place], [place] [address]   Resp Panel by RT-PCR (Flu A&B, Covid) Nasopharyngeal Swab     Status: None   Collection Time: 03/23/21  2:11 PM   Specimen: Nasopharyngeal Swab; Nasopharyngeal(NP) swabs in vial transport medium  Result Value Ref Range Status   SARS Coronavirus 2 by RT PCR NEGATIVE NEGATIVE Final    Comment: (NOTE) SARS-CoV-2 target nucleic acids are NOT DETECTED.  The SARS-CoV-2 RNA is generally detectable in upper respiratory specimens during the acute phase of infection. The lowest concentration of SARS-CoV-2 viral copies this assay can detect is 138 copies/mL. A negative result does not preclude SARS-Cov-2 infection and should not be used as the sole basis for treatment or other patient management decisions. A negative result may occur with  improper specimen collection/handling, submission of specimen other than nasopharyngeal swab, presence of viral mutation(s) within the areas targeted by this assay,  and inadequate number of viral copies(<138 copies/mL). A negative result must be combined with clinical observations, patient history, and epidemiological information. The expected result is Negative.  Fact Sheet for Patients:  EntrepreneurPulse.com.au  Fact Sheet for Healthcare Providers:  IncredibleEmployment.be  This test is no t yet approved or cleared by the [place] FDA and  has been authorized for detection and/or diagnosis of SARS-CoV-2 by FDA under an Emergency Use Authorization (EUA). This EUA will remain  in effect (meaning this test can be used) for the duration of the COVID-19 declaration under Section 564(b)(1) of the Act, 21 U.S.C.section 360bbb-3(b)(1), unless the authorization is terminated  or revoked sooner.       Influenza A by PCR NEGATIVE NEGATIVE Final   Influenza B by PCR NEGATIVE NEGATIVE Final    Comment: (NOTE) The Xpert Xpress SARS-CoV-2/FLU/RSV plus assay is intended as an aid in the diagnosis of influenza from Nasopharyngeal swab specimens and should not be used as a sole basis for treatment. Nasal washings and aspirates are unacceptable for Xpert Xpress SARS-CoV-2/FLU/RSV testing.  Fact Sheet for Patients: EntrepreneurPulse.com.au  Fact Sheet for Healthcare Providers: IncredibleEmployment.be  This test is not yet approved or cleared by the [place] FDA and has been authorized for detection and/or diagnosis of SARS-CoV-2 by FDA under an Emergency Use Authorization (EUA). This EUA will remain in effect (  meaning this test can be used) for the duration of the COVID-19 declaration under Section 564(b)(1) of the Act, 21 U.S.C. section 360bbb-3(b)(1), unless the authorization is terminated or revoked.  Performed at [facility], [place] [address] [place], [place] [address]   MRSA Next Gen by PCR, Nasal     Status: None   Collection Time: 03/23/21  5:23 PM    Specimen: Nasal Mucosa; Nasal Swab  Result Value Ref Range Status   MRSA by PCR Next Gen NOT DETECTED NOT DETECTED Final    Comment: (NOTE) The GeneXpert MRSA Assay (FDA approved for NASAL specimens only), is one component of a comprehensive MRSA colonization surveillance program. It is not intended to diagnose MRSA infection nor to guide or monitor treatment for MRSA infections. Test performance is not FDA approved in patients less than 52 years old. Performed at [facility], [place] [address] [place], [place] [address]     SIGNED:   Charlynne Cousins, MD  [facility] 03/28/2021, 11:31 AM Pager   If 7PM-7AM, please contact night-coverage [email or web address] Password [email or web address]

## 2021-03-28 NOTE — Plan of Care (Signed)
  Problem: Safety: Goal: Non-violent Restraint(s) Outcome: Completed/Met   Problem: Education: Goal: Knowledge of General Education information will improve Description: Including pain rating scale, medication(s)/side effects and non-pharmacologic comfort measures Outcome: Completed/Met   Problem: Health Behavior/Discharge Planning: Goal: Ability to manage health-related needs will improve Outcome: Completed/Met   Problem: Clinical Measurements: Goal: Ability to maintain clinical measurements within normal limits will improve Outcome: Completed/Met Goal: Will remain free from infection Outcome: Completed/Met Goal: Diagnostic test results will improve Outcome: Completed/Met Goal: Respiratory complications will improve Outcome: Completed/Met Goal: Cardiovascular complication will be avoided Outcome: Completed/Met   Problem: Activity: Goal: Risk for activity intolerance will decrease Outcome: Completed/Met   Problem: Nutrition: Goal: Adequate nutrition will be maintained Outcome: Completed/Met   Problem: Coping: Goal: Level of anxiety will decrease Outcome: Completed/Met   Problem: Elimination: Goal: Will not experience complications related to bowel motility Outcome: Completed/Met Goal: Will not experience complications related to urinary retention Outcome: Completed/Met   Problem: Pain Managment: Goal: General experience of comfort will improve Outcome: Completed/Met   Problem: Safety: Goal: Ability to remain free from injury will improve Outcome: Completed/Met   Problem: Skin Integrity: Goal: Risk for impaired skin integrity will decrease Outcome: Completed/Met

## 2021-04-15 ENCOUNTER — Encounter (HOSPITAL_COMMUNITY): Payer: Self-pay

## 2021-04-15 ENCOUNTER — Other Ambulatory Visit: Payer: Self-pay

## 2021-04-15 ENCOUNTER — Emergency Department (HOSPITAL_COMMUNITY)
Admission: EM | Admit: 2021-04-15 | Discharge: 2021-04-16 | Disposition: A | Discharge status: Home / Self Care | Payer: Medicaid Other | Attending: Emergency Medicine | Admitting: Emergency Medicine

## 2021-04-15 DIAGNOSIS — Z7984 Long term (current) use of oral hypoglycemic drugs: Secondary | ICD-10-CM | POA: Insufficient documentation

## 2021-04-15 DIAGNOSIS — N644 Mastodynia: Secondary | ICD-10-CM | POA: Diagnosis not present

## 2021-04-15 DIAGNOSIS — F17290 Nicotine dependence, other tobacco product, uncomplicated: Secondary | ICD-10-CM | POA: Insufficient documentation

## 2021-04-15 DIAGNOSIS — E1165 Type 2 diabetes mellitus with hyperglycemia: Secondary | ICD-10-CM | POA: Insufficient documentation

## 2021-04-15 DIAGNOSIS — R112 Nausea with vomiting, unspecified: Secondary | ICD-10-CM | POA: Diagnosis not present

## 2021-04-15 DIAGNOSIS — I10 Essential (primary) hypertension: Secondary | ICD-10-CM | POA: Insufficient documentation

## 2021-04-15 DIAGNOSIS — D72829 Elevated white blood cell count, unspecified: Secondary | ICD-10-CM | POA: Insufficient documentation

## 2021-04-15 DIAGNOSIS — E1142 Type 2 diabetes mellitus with diabetic polyneuropathy: Secondary | ICD-10-CM | POA: Diagnosis not present

## 2021-04-15 DIAGNOSIS — Z79899 Other long term (current) drug therapy: Secondary | ICD-10-CM | POA: Diagnosis not present

## 2021-04-15 DIAGNOSIS — K219 Gastro-esophageal reflux disease without esophagitis: Secondary | ICD-10-CM | POA: Insufficient documentation

## 2021-04-15 DIAGNOSIS — Z794 Long term (current) use of insulin: Secondary | ICD-10-CM | POA: Insufficient documentation

## 2021-04-15 LAB — CBC WITH DIFFERENTIAL/PLATELET
Abs Immature Granulocytes: 0.06 10*3/uL (ref 0.00–0.07)
Basophils Absolute: 0.1 10*3/uL (ref 0.0–0.1)
Basophils Relative: 1 %
Eosinophils Absolute: 0.4 10*3/uL (ref 0.0–0.5)
Eosinophils Relative: 2 %
HCT: 35.4 % — ABNORMAL LOW (ref 36.0–46.0)
Hemoglobin: 11.2 g/dL — ABNORMAL LOW (ref 12.0–15.0)
Immature Granulocytes: 0 %
Lymphocytes Relative: 26 %
Lymphs Abs: 4.9 10*3/uL — ABNORMAL HIGH (ref 0.7–4.0)
MCH: 26 pg (ref 26.0–34.0)
MCHC: 31.6 g/dL (ref 30.0–36.0)
MCV: 82.3 fL (ref 80.0–100.0)
Monocytes Absolute: 1.1 10*3/uL — ABNORMAL HIGH (ref 0.1–1.0)
Monocytes Relative: 6 %
Neutro Abs: 12.6 10*3/uL — ABNORMAL HIGH (ref 1.7–7.7)
Neutrophils Relative %: 65 %
Platelets: 318 10*3/uL (ref 150–400)
RBC: 4.3 MIL/uL (ref 3.87–5.11)
RDW: 15 % (ref 11.5–15.5)
WBC: 19.1 10*3/uL — ABNORMAL HIGH (ref 4.0–10.5)
nRBC: 0 % (ref 0.0–0.2)

## 2021-04-15 LAB — COMPREHENSIVE METABOLIC PANEL
ALT: 17 U/L (ref 0–44)
AST: 22 U/L (ref 15–41)
Albumin: 4.3 g/dL (ref 3.5–5.0)
Alkaline Phosphatase: 63 U/L (ref 38–126)
Anion gap: 6 (ref 5–15)
BUN: 8 mg/dL (ref 6–20)
CO2: 28 mmol/L (ref 22–32)
Calcium: 9.3 mg/dL (ref 8.9–10.3)
Chloride: 106 mmol/L (ref 98–111)
Creatinine, Ser: 0.79 mg/dL (ref 0.44–1.00)
GFR, Estimated: >60 mL/min (ref >60)
Glucose, Bld: 200 mg/dL — ABNORMAL HIGH (ref 70–99)
Potassium: 4 mmol/L (ref 3.5–5.1)
Sodium: 140 mmol/L (ref 135–145)
Total Bilirubin: 0.8 mg/dL (ref 0.3–1.2)
Total Protein: 7.9 g/dL (ref 6.5–8.1)

## 2021-04-15 NOTE — ED Triage Notes (Addendum)
Pt BIB EMS. Pt reports with nausea and vomiting since today. (One episode). Pt states that there is a possibility she could be pregnant. Pt also reports left breast pain.

## 2021-04-16 LAB — URINALYSIS, ROUTINE W REFLEX MICROSCOPIC
Bacteria, UA: NONE SEEN
Bilirubin Urine: NEGATIVE
Glucose, UA: 150 mg/dL — AB
Hgb urine dipstick: NEGATIVE
Ketones, ur: NEGATIVE mg/dL
Leukocytes,Ua: NEGATIVE
Nitrite: NEGATIVE
Protein, ur: NEGATIVE mg/dL
Specific Gravity, Urine: 1.014 (ref 1.005–1.030)
pH: 8 (ref 5.0–8.0)

## 2021-04-16 LAB — LITHIUM LEVEL: Lithium Lvl: 0.65 mmol/L (ref 0.60–1.20)

## 2021-04-16 LAB — POC URINE PREG, ED: Preg Test, Ur: NEGATIVE

## 2021-04-16 LAB — LIPASE, BLOOD: Lipase: 35 U/L (ref 11–51)

## 2021-04-16 LAB — PREGNANCY, URINE: Preg Test, Ur: NEGATIVE

## 2021-04-16 NOTE — ED Provider Notes (Signed)
[facility] DEPT Provider Note  CSN: [id number] Arrival date & time: 04/15/21 2232  Chief Complaint(s) Nausea, Emesis, and Left breast pain  HPI [name] is a 30 y.o. female here with 2 days of constant left breast pain.  Described as aching.  Worse with palpation.  No noted swelling or redness.  No trauma.  No alleviating factors.  Patient also reports 1 bout of nausea and nonbloody nonbilious emesis shortly after eating fast food.  This occurred about 4 hours prior to arrival.  Has not recurred since.  Has been drinking soda without repeated emesis.  Still reports nausea.  No abdominal pain.  She denies any recent fevers or infections.  No coughing or congestion.  No urinary symptoms.   Patient has a past medical history of bipolar on lithium.  Reports that she is compliant with medication and taking the recommended dose.   Patient is most concerned about being pregnant.   Emesis  Past Medical History Past Medical History:  Diagnosis Date   Anxiety    Bipolar 1 disorder ([place])    Cognitive deficits    Depression    Diabetes mellitus without complication ([place])    Hypertension    Mental disorder    Mental health disorder    Obesity    Patient Active Problem List   Diagnosis Date Noted   Suicidal overdose ([place]) 03/23/2021   Syphilis 07/15/2020   Malingering 06/05/2020   Gastroesophageal reflux disease 05/04/2020   Hyperglycemia due to type 2 diabetes mellitus ([place]) 05/04/2020   Long term (current) use of insulin ([place]) 05/04/2020   Migraine without aura 05/04/2020   Morbid obesity ([place]) 05/04/2020   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 2 diabetes mellitus ([place]) 05/04/2020   Prolapsed internal hemorrhoids 05/04/2020   Vitamin D deficiency 05/04/2020   Other symptoms and signs involving cognitive functions and awareness 05/04/2020   Suicide attempt ([place])    Anxiety state 03/06/2020   Schizophrenia ([place]) 09/13/2019   Bipolar I disorder, most  recent episode depressed ([place]) 06/23/2019   MDD (major depressive disorder) 10/10/2018   Schizoaffective disorder, bipolar type ([place]) 09/25/2018   Affective psychosis, bipolar ([place]) 06/13/2018   HTN (hypertension) 05/03/2018   Tobacco use disorder 05/03/2018   Adjustment disorder with emotional disturbance 01/02/2018   Schizophrenia, 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   Moderate bipolar I disorder, most recent episode depressed ([place])    Psychosis ([place])    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/2017   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   OCD (obsessive compulsive disorder) 10/05/2016   Major depressive disorder, recurrent episode, mild ([place]) 05/04/2016   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 Authorizing Provider  albuterol (VENTOLIN HFA) 108 (90 Base) MCG/ACT inhaler Inhale 1-2 puffs into the lungs every 6 (six) hours as needed for wheezing or shortness of breath. 01/11/21   Pearson Forster, NP  amLODipine (NORVASC) 5 MG tablet Take 5 mg by mouth daily.    [provider]  cholecalciferol (VITAMIN D) 25 MCG (  1000 UNIT) tablet Take 1,000 Units by mouth daily. 04/30/20   [provider]  glimepiride (AMARYL) 4 MG tablet Take 4 mg by mouth daily.    [provider]  guanFACINE (INTUNIV) 1 MG TB24 ER tablet Take 1 mg by mouth at bedtime.    [provider]  haloperidol (HALDOL) 10 MG tablet Take  10-20 mg by mouth See admin instructions. 10 mg in the morning  20 mg at bedtime 05/09/20   [provider]  haloperidol decanoate (HALDOL DECANOATE) 100 MG/ML injection Inject 150 mg into the muscle every 28 (twenty-eight) days. Inject 150mg  (1.32ml) every 28 days.    [provider]  hydrOXYzine (VISTARIL) 50 MG capsule Take 50 mg by mouth at bedtime as needed for anxiety.    [provider]  insulin detemir (LEVEMIR) 100 UNIT/ML injection Inject 0.4 mLs (40 Units total) into the skin 2 (two) times daily. 05/05/20   Montine Circle, PA-C  lithium carbonate (ESKALITH) 450 MG CR tablet Take 450 mg by mouth 2 (two) times daily.    [provider]  metoprolol tartrate (LOPRESSOR) 25 MG tablet Take 0.5 tablets (12.5 mg total) by mouth 2 (two) times daily. 03/28/21   Charlynne Cousins, MD  OLANZapine zydis (ZYPREXA) 10 MG disintegrating tablet Take 10 mg by mouth every 8 (eight) hours as needed (agitation).    [provider]  omeprazole (PRILOSEC) 20 MG capsule Take 20 mg by mouth daily.    [provider]  primidone (MYSOLINE) 50 MG tablet Take 100 mg by mouth 3 (three) times daily.    [provider]  sertraline (ZOLOFT) 50 MG tablet Take 50 mg by mouth daily.    [provider]  simvastatin (ZOCOR) 20 MG tablet Take 20 mg by mouth daily.    [provider]  vitamin B-12 (CYANOCOBALAMIN) 1000 MCG tablet Take 1,000 mcg by mouth daily. 11/13/19   [provider]                                                                                                                                    Past Surgical History Past Surgical History:  Procedure Laterality Date   CESAREAN SECTION     CESAREAN SECTION N/A 04/25/2013   Procedure: REPEAT CESAREAN SECTION;  Surgeon: [name], MD;  Location: [facility];  Service: Obstetrics;  Laterality: N/A;   MASS EXCISION N/A 06/03/2012   Procedure: EXCISION MASS;  Surgeon: [name], MD;  Location: [facility];  Service: ENT;  Laterality: N/A;  Excision uvula mass   TONSILLECTOMY N/A 06/03/2012   Procedure: TONSILLECTOMY;  Surgeon: [name], MD;  Location: [facility];  Service: ENT;  Laterality: N/A;   TONSILLECTOMY     Family History Family History  Problem Relation Age of Onset   Hypertension Mother    Diabetes Father     Social History Social History  Tobacco Use   Smoking status: Every Day    Types: Cigars   Smokeless tobacco: Never   Tobacco comments:    Pt declined  Vaping Use   Vaping Use: Never used  Substance Use Topics   Alcohol use: Not Currently   Drug use: Not Currently    Types: "Crack" cocaine, Other-see comments    Comment: Patient reports hx of smoking Crack   Allergies Wellbutrin [bupropion], Omnipaque [iohexol], Contrast media [iodinated diagnostic agents], Depakote [divalproex sodium], Hydroxyzine, and Penicillin g  Review of Systems Review of Systems  Gastrointestinal:  Positive for vomiting.  All other systems are reviewed and are negative for acute change except as noted in the HPI  Physical Exam Vital Signs  I have reviewed the triage vital signs BP (!) 186/109    Pulse 97    Temp 99 F (37.2 C) (Oral)    Resp 20    Ht 5\' 7"  (1.702 m)    Wt 120.2 kg    SpO2 99%    BMI 41.50 kg/m   Physical Exam Vitals reviewed. Exam conducted with a chaperone present.  Constitutional:      General: She is not in acute distress.    Appearance: She is well-developed. She is morbidly obese. She is not diaphoretic.  HENT:     Head: Normocephalic and atraumatic.     Right Ear: External ear normal.     Left Ear: External ear normal.     Nose: Nose normal.  Eyes:     General: No scleral icterus.    Conjunctiva/sclera: Conjunctivae normal.  Neck:     Trachea: Phonation normal.  Cardiovascular:     Rate and Rhythm: Normal rate and regular rhythm.  Pulmonary:     Effort: Pulmonary effort is  normal. No respiratory distress.     Breath sounds: No stridor.  Chest:  Breasts:    Right: No swelling, bleeding, inverted nipple, mass, nipple discharge, skin change or tenderness.     Left: Tenderness present. No swelling, bleeding, inverted nipple, mass, nipple discharge or skin change.  Abdominal:     General: There is no distension.     Tenderness: There is no abdominal tenderness.  Musculoskeletal:        General: Normal range of motion.     Cervical back: Normal range of motion.  Lymphadenopathy:     Upper Body:     Right upper body: No supraclavicular or axillary adenopathy.     Left upper body: No supraclavicular or axillary adenopathy.  Neurological:     Mental Status: She is alert and oriented to person, place, and time.  Psychiatric:        Behavior: Behavior normal.    ED Results and Treatments Labs (all labs ordered are listed, but only abnormal results are displayed) Labs Reviewed  CBC WITH DIFFERENTIAL/PLATELET - Abnormal; Notable for the following components:      Result Value   WBC 19.1 (*)    Hemoglobin 11.2 (*)    HCT 35.4 (*)    Neutro Abs 12.6 (*)    Lymphs Abs 4.9 (*)    Monocytes Absolute 1.1 (*)    All other components within normal limits  COMPREHENSIVE METABOLIC PANEL - Abnormal; Notable for the following components:   Glucose, Bld 200 (*)    All other components within normal limits  URINALYSIS, ROUTINE W REFLEX MICROSCOPIC - Abnormal; Notable for the following components:   Color, Urine STRAW (*)    Glucose, UA 150 (*)  All other components within normal limits  RESP PANEL BY RT-PCR (FLU A&B, COVID) ARPGX2  LIPASE, BLOOD  LITHIUM LEVEL  PREGNANCY, URINE  POC URINE PREG, ED                                                                                                                         EKG  EKG Interpretation  Date/Time:    Ventricular Rate:    PR Interval:    QRS Duration:   QT Interval:    QTC Calculation:   R Axis:      Text Interpretation:         Radiology No results found.  Pertinent labs & imaging results that were available during my care of the patient were reviewed by me and considered in my medical decision making (see MDM for details).  Medications Ordered in ED Medications - No data to display                                                                                                                                   Procedures Procedures  (including critical care time)  Medical Decision Making / ED Course I have reviewed the nursing notes for this encounter and the patient's prior records (if available in EHR or on provided paperwork).  [name] was evaluated in Emergency Department on 04/16/2021 for the symptoms described in the history of present illness. She was evaluated in the context of the global COVID-19 pandemic, which necessitated consideration that the patient might be at risk for infection with the SARS-CoV-2 virus that causes COVID-19. Institutional protocols and algorithms that pertain to the evaluation of patients at risk for COVID-19 are in a state of rapid change based on information released by regulatory bodies including the CDC and federal and state organizations. These policies and algorithms were followed during the patient's care in the ED.     Breast tenderness Exam not consistent with mastitis, abscess, or cellulitis. No masses appreciated on exam. Possible contusion  Nausea/Vomiting Abdomen benign on exam. Will assess for evidence of DKA, pancreatitis, biliary disease. Given her history of bipolar on lithium, will obtain screening labs to rule out lithium toxicity. CBC with leukocytosis.  No anemia CMP without significant electrolyte derangements or renal sufficiency.  No evidence of bili obstruction. Patient has hyperglycemia without evidence of DKA. No evidence of pancreatitis. Lithium level within normal limits. UA obtained  to rule out  urinary tract infection and negative UPT obtained to rule out pregnancy related nausea vomiting and negative Patient is tolerating p.o. without intervention Low suspicion for serious intra-abdominal inflammatory/infectious process requiring imaging at this time.  Pertinent labs & imaging results that were available during my care of the patient were reviewed by me and considered in my medical decision making:    Final Clinical Impression(s) / ED Diagnoses Final diagnoses:  Breast pain, left  Nausea and vomiting in adult   The patient appears reasonably screened and/or stabilized for discharge and I doubt any other medical condition or other [facility] requiring further screening, evaluation, or treatment in the ED at this time prior to discharge. Safe for discharge with strict return precautions.  Disposition: Discharge  Condition: Good  I have discussed the results, Dx and Tx plan with the patient/family who expressed understanding and agree(s) with the plan. Discharge instructions discussed at length. The patient/family was given strict return precautions who verbalized understanding of the instructions. No further questions at time of discharge.    ED Discharge Orders     None       Follow Up: Trey Sailors, [place] [phone]  Call  to schedule an appointment for close follow up     This chart was dictated using voice recognition software.  Despite best efforts to proofread,  errors can occur which can change the documentation meaning.    Fatima Blank, MD 04/16/21 [phone]

## 2021-08-29 ENCOUNTER — Emergency Department (HOSPITAL_COMMUNITY): Payer: Medicaid Other

## 2021-08-29 ENCOUNTER — Other Ambulatory Visit: Payer: Self-pay

## 2021-08-29 ENCOUNTER — Emergency Department (HOSPITAL_COMMUNITY)
Admission: EM | Admit: 2021-08-29 | Discharge: 2021-08-29 | Discharge status: Against Medical Advice | Payer: Medicaid Other | Attending: Emergency Medicine | Admitting: Emergency Medicine

## 2021-08-29 ENCOUNTER — Encounter (HOSPITAL_COMMUNITY): Payer: Self-pay

## 2021-08-29 DIAGNOSIS — Z794 Long term (current) use of insulin: Secondary | ICD-10-CM | POA: Diagnosis not present

## 2021-08-29 DIAGNOSIS — Z7984 Long term (current) use of oral hypoglycemic drugs: Secondary | ICD-10-CM | POA: Insufficient documentation

## 2021-08-29 DIAGNOSIS — R5383 Other fatigue: Secondary | ICD-10-CM

## 2021-08-29 LAB — CBC WITH DIFFERENTIAL/PLATELET
Abs Immature Granulocytes: 0.03 10*3/uL (ref 0.00–0.07)
Basophils Absolute: 0.1 10*3/uL (ref 0.0–0.1)
Basophils Relative: 1 %
Eosinophils Absolute: 0.2 10*3/uL (ref 0.0–0.5)
Eosinophils Relative: 2 %
HCT: 36.7 % (ref 36.0–46.0)
Hemoglobin: 11.7 g/dL — ABNORMAL LOW (ref 12.0–15.0)
Immature Granulocytes: 0 %
Lymphocytes Relative: 19 %
Lymphs Abs: 2 10*3/uL (ref 0.7–4.0)
MCH: 25.5 pg — ABNORMAL LOW (ref 26.0–34.0)
MCHC: 31.9 g/dL (ref 30.0–36.0)
MCV: 80.1 fL (ref 80.0–100.0)
Monocytes Absolute: 0.7 10*3/uL (ref 0.1–1.0)
Monocytes Relative: 7 %
Neutro Abs: 7.5 10*3/uL (ref 1.7–7.7)
Neutrophils Relative %: 71 %
Platelets: 194 10*3/uL (ref 150–400)
RBC: 4.58 MIL/uL (ref 3.87–5.11)
RDW: 16.5 % — ABNORMAL HIGH (ref 11.5–15.5)
WBC: 10.6 10*3/uL — ABNORMAL HIGH (ref 4.0–10.5)
nRBC: 0 % (ref 0.0–0.2)

## 2021-08-29 LAB — I-STAT CHEM 8, ED
BUN: 5 mg/dL — ABNORMAL LOW (ref 6–20)
Calcium, Ion: 1.17 mmol/L (ref 1.15–1.40)
Chloride: 103 mmol/L (ref 98–111)
Creatinine, Ser: 0.7 mg/dL (ref 0.44–1.00)
Glucose, Bld: 113 mg/dL — ABNORMAL HIGH (ref 70–99)
HCT: 39 % (ref 36.0–46.0)
Hemoglobin: 13.3 g/dL (ref 12.0–15.0)
Potassium: 3.5 mmol/L (ref 3.5–5.1)
Sodium: 140 mmol/L (ref 135–145)
TCO2: 28 mmol/L (ref 22–32)

## 2021-08-29 LAB — HEPATIC FUNCTION PANEL
ALT: 16 U/L (ref 0–44)
AST: 19 U/L (ref 15–41)
Albumin: 4.5 g/dL (ref 3.5–5.0)
Alkaline Phosphatase: 59 U/L (ref 38–126)
Bilirubin, Direct: 0.1 mg/dL (ref 0.0–0.2)
Indirect Bilirubin: 0.4 mg/dL (ref 0.3–0.9)
Total Bilirubin: 0.5 mg/dL (ref 0.3–1.2)
Total Protein: 8.5 g/dL — ABNORMAL HIGH (ref 6.5–8.1)

## 2021-08-29 LAB — BLOOD GAS, VENOUS
Acid-Base Excess: 3.5 mmol/L — ABNORMAL HIGH (ref 0.0–2.0)
Bicarbonate: 29.9 mmol/L — ABNORMAL HIGH (ref 20.0–28.0)
O2 Saturation: 53.5 %
Patient temperature: 37
pCO2, Ven: 53 mmHg (ref 44–60)
pH, Ven: 7.36 (ref 7.25–7.43)
pO2, Ven: <31 mmHg — CL (ref 32–45)

## 2021-08-29 LAB — I-STAT BETA HCG BLOOD, ED (MC, WL, AP ONLY): I-stat hCG, quantitative: <5 m[IU]/mL (ref <5)

## 2021-08-29 LAB — ETHANOL: Alcohol, Ethyl (B): <10 mg/dL (ref <10)

## 2021-08-29 LAB — PREGNANCY, URINE: Preg Test, Ur: NEGATIVE

## 2021-08-29 LAB — LITHIUM LEVEL: Lithium Lvl: 0.36 mmol/L — ABNORMAL LOW (ref 0.60–1.20)

## 2021-08-29 NOTE — ED Provider Notes (Signed)
?[facility] DEPT ?Provider Note ? ? ?CSN: [id number] ?Arrival date & time: 08/29/21  4098 ? ?  ? ?History ? ?Chief Complaint  ?Patient presents with  ? Fatigue  ? ? ?[name] is a 31 y.o. female. ? ?HPI ?She presents for evaluation of sleepiness and lethargy for 1 week.  She states she is taking her usual medicines and eating normally.  She continues to see her psychiatrist and therapist as well.  She states she called EMS today to bring her here for evaluation.  She denies fever, chills, cough, focal weakness or paresthesia. ?  ? ?Home Medications ?Prior to Admission medications   ?Medication Sig Start Date End Date Taking? Authorizing Provider  ?albuterol (VENTOLIN HFA) 108 (90 Base) MCG/ACT inhaler Inhale 1-2 puffs into the lungs every 6 (six) hours as needed for wheezing or shortness of breath. 01/11/21   Pearson Forster, NP  ?amLODipine (NORVASC) 5 MG tablet Take 5 mg by mouth daily.    [provider]  ?cholecalciferol (VITAMIN D) 25 MCG (1000 UNIT) tablet Take 1,000 Units by mouth daily. 04/30/20   [provider]  ?glimepiride (AMARYL) 4 MG tablet Take 4 mg by mouth daily.    [provider]  ?guanFACINE (INTUNIV) 1 MG TB24 ER tablet Take 1 mg by mouth at bedtime.    [provider]  ?haloperidol (HALDOL) 10 MG tablet Take 10-20 mg by mouth See admin instructions. 10 mg in the morning  ?20 mg at bedtime 05/09/20   [provider]  ?haloperidol decanoate (HALDOL DECANOATE) 100 MG/ML injection Inject 150 mg into the muscle every 28 (twenty-eight) days. Inject '150mg'$  (1.39m) every 28 days.    [provider]  ?hydrOXYzine (VISTARIL) 50 MG capsule Take 50 mg by mouth at bedtime as needed for anxiety.    [provider]  ?insulin detemir (LEVEMIR) 100 UNIT/ML injection Inject 0.4 mLs (40 Units total) into the skin 2 (two) times daily. 05/05/20   BMontine Circle PA-C  ?lithium carbonate (ESKALITH) 450 MG CR tablet Take  450 mg by mouth 2 (two) times daily.    [provider]  ?metoprolol tartrate (LOPRESSOR) 25 MG tablet Take 0.5 tablets (12.5 mg total) by mouth 2 (two) times daily. 03/28/21   FCharlynne Cousins MD  ?OLANZapine zydis (ZYPREXA) 10 MG disintegrating tablet Take 10 mg by mouth every 8 (eight) hours as needed (agitation).    [provider]  ?omeprazole (PRILOSEC) 20 MG capsule Take 20 mg by mouth daily.    [provider]  ?primidone (MYSOLINE) 50 MG tablet Take 100 mg by mouth 3 (three) times daily.    [provider]  ?sertraline (ZOLOFT) 50 MG tablet Take 50 mg by mouth daily.    [provider]  ?simvastatin (ZOCOR) 20 MG tablet Take 20 mg by mouth daily.    [provider]  ?vitamin B-12 (CYANOCOBALAMIN) 1000 MCG tablet Take 1,000 mcg by mouth daily. 11/13/19   [provider]  ?   ? ?Allergies    ?Wellbutrin [bupropion], Omnipaque [iohexol], Contrast media [iodinated contrast media], Depakote [divalproex sodium], Hydroxyzine, and Penicillin g   ? ?Review of Systems   ?Review of Systems ? ?Physical Exam ?Updated Vital Signs ?BP (!) 141/101   Pulse 95   Temp 98.3 ?F (36.8 ?C) (Oral)   Resp 18   Ht '5\' 7"'$  (1.702 m)   Wt 115.2 kg   LMP 08/06/2021 (Approximate)   SpO2 100%   BMI 39.78  kg/m?  ?Physical Exam ?Vitals and nursing note reviewed.  ?Constitutional:   ?   General: She is not in acute distress. ?   Appearance: She is well-developed. She is not ill-appearing, toxic-appearing or diaphoretic.  ?HENT:  ?   Head: Normocephalic and atraumatic.  ?   Right Ear: External ear normal.  ?   Left Ear: External ear normal.  ?Eyes:  ?   Conjunctiva/sclera: Conjunctivae normal.  ?   Pupils: Pupils are equal, round, and reactive to light.  ?Neck:  ?   Trachea: Phonation normal.  ?Cardiovascular:  ?   Rate and Rhythm: Normal rate.  ?Pulmonary:  ?   Effort: Pulmonary effort is normal. No respiratory distress.  ?   Breath sounds: No stridor.  ?Abdominal:  ?    General: There is no distension.  ?   Palpations: Abdomen is soft.  ?   Tenderness: There is no abdominal tenderness.  ?Musculoskeletal:     ?   General: Normal range of motion.  ?   Cervical back: Normal range of motion and neck supple.  ?Skin: ?   General: Skin is warm and dry.  ?   Coloration: Skin is not jaundiced or pale.  ?Neurological:  ?   Mental Status: She is alert and oriented to person, place, and time.  ?   Cranial Nerves: No cranial nerve deficit.  ?   Sensory: No sensory deficit.  ?   Motor: No abnormal muscle tone.  ?   Coordination: Coordination normal.  ?Psychiatric:     ?   Mood and Affect: Mood normal.     ?   Behavior: Behavior normal.     ?   Thought Content: Thought content normal.     ?   Judgment: Judgment normal.  ? ? ?ED Results / Procedures / Treatments   ?Labs ?(all labs ordered are listed, but only abnormal results are displayed) ?Labs Reviewed  ?HEPATIC FUNCTION PANEL - Abnormal; Notable for the following components:  ?    Result Value  ? Total Protein 8.5 (*)   ? All other components within normal limits  ?CBC WITH DIFFERENTIAL/PLATELET - Abnormal; Notable for the following components:  ? WBC 10.6 (*)   ? Hemoglobin 11.7 (*)   ? MCH 25.5 (*)   ? RDW 16.5 (*)   ? All other components within normal limits  ?LITHIUM LEVEL - Abnormal; Notable for the following components:  ? Lithium Lvl 0.36 (*)   ? All other components within normal limits  ?BLOOD GAS, VENOUS - Abnormal; Notable for the following components:  ? pO2, Ven <31 (*)   ? Bicarbonate 29.9 (*)   ? Acid-Base Excess 3.5 (*)   ? All other components within normal limits  ?I-STAT CHEM 8, ED - Abnormal; Notable for the following components:  ? BUN 5 (*)   ? Glucose, Bld 113 (*)   ? All other components within normal limits  ?PREGNANCY, URINE  ?ETHANOL  ?RAPID URINE DRUG SCREEN, HOSP PERFORMED  ?URINALYSIS, ROUTINE W REFLEX MICROSCOPIC  ?I-STAT BETA HCG BLOOD, ED ([facility], [facility], [facility] ONLY)  ? ? ?EKG ?None ? ?Radiology ?DG Chest Port 1  View ? ?Result Date: 08/29/2021 ?CLINICAL DATA:  Left OG. EXAM: PORTABLE CHEST 1 VIEW COMPARISON:  March 23, 2021. FINDINGS: The heart size and mediastinal contours are within normal limits. Both lungs are clear. The visualized skeletal structures are unremarkable. IMPRESSION: No active disease. Electronically Signed   By: Bobbe Medico.D.  On: 08/29/2021 13:33   ? ?Procedures ?Procedures  ? ? ?Medications Ordered in ED ?Medications - No data to display ? ?ED Course/ Medical Decision Making/ A&P ?  ?                        ?Medical Decision Making ?Patient presenting with nonspecific sleepiness and lethargy.  Differential diagnosis includes medication complications, metabolic disorders, drug intoxication, sleep apnea, occult illness.  Screening evaluation ordered ? ?Amount and/or Complexity of Data Reviewed ?Independent Historian:  ?   Details: She is a cogent historian, she is cooperative ?External Data Reviewed: labs, ECG and notes. ?   Details: Last hospitalization, November 2022, for multiple drug overdose, intentional, had secondary agitation and required paralysis and intubation for safety reasons.  She recovered from that well. ?Labs: ordered. ?   Details: CBC, metabolic panel, toxic drug levels, urinalysis, VBG-normal except total protein high, white count high, hemoglobin low, lithium level low, glucose high ?Radiology: ordered and independent interpretation performed. ?   Details: Chest x-ray-no infiltrate or edema ? ?Risk ?Decision regarding hospitalization. ?Risk Details: Patient presenting with nonspecific lethargy and sleepiness.  Screening for toxicity and acute medical illness was reassuring.  No evidence for lithium toxicity.  No evidence for intoxicating substances causing lethargy.  Doubt acute unstable psychiatric illness.  Patient chose to leave AMA before she could be disposition.  I do not think that she requires hospitalization at this time. ? ? ? ? ? ? ? ? ? ? ?Final Clinical  Impression(s) / ED Diagnoses ?Final diagnoses:  ?Other fatigue  ? ? ?Rx / DC Orders ?ED Discharge Orders   ? ? None  ? ?  ? ? ?  ?Daleen Bo, MD ?08/29/21 1626 ? ?

## 2021-08-29 NOTE — ED Triage Notes (Signed)
Per EMS- patient c/o fatigue and feeling lightheaded x 1 week. Patient is also requesting a pregnancy test. ?

## 2021-08-29 NOTE — ED Notes (Addendum)
Pt coming out of room multiple times stating she wants to leave. Pt encouraged to wait in room and provider will come update her soon. Pt states she just wanted to leave. Pt informed if she left this would be against medical advice at this time. Pt stated she "has been here all day and is ready to go." Pt states "Im just going to leave." This RN and Denton Ar, Charge RN witness to pt being encouraged to stay. Pt noted to be leaving the department. MD made aware.  ?

## 2021-08-29 NOTE — ED Notes (Signed)
Pt asking for an update, MD made aware.  ?

## 2021-08-29 NOTE — ED Notes (Signed)
Pt given sandwich and sprite.  ?

## 2021-09-04 ENCOUNTER — Ambulatory Visit (HOSPITAL_COMMUNITY)
Admission: EM | Admit: 2021-09-04 | Discharge: 2021-09-04 | Discharge status: Against Medical Advice | Payer: Medicaid Other

## 2021-09-09 ENCOUNTER — Emergency Department (HOSPITAL_COMMUNITY)
Admission: EM | Admit: 2021-09-09 | Discharge: 2021-09-09 | Disposition: A | Discharge status: Home / Self Care | Payer: Medicaid Other | Attending: Emergency Medicine | Admitting: Emergency Medicine

## 2021-09-09 DIAGNOSIS — F3481 Disruptive mood dysregulation disorder: Secondary | ICD-10-CM | POA: Diagnosis not present

## 2021-09-09 DIAGNOSIS — R4589 Other symptoms and signs involving emotional state: Secondary | ICD-10-CM

## 2021-09-09 DIAGNOSIS — Z79899 Other long term (current) drug therapy: Secondary | ICD-10-CM | POA: Diagnosis not present

## 2021-09-09 DIAGNOSIS — Z794 Long term (current) use of insulin: Secondary | ICD-10-CM | POA: Diagnosis not present

## 2021-09-09 LAB — COMPREHENSIVE METABOLIC PANEL
ALT: 15 U/L (ref 0–44)
AST: 16 U/L (ref 15–41)
Albumin: 3.9 g/dL (ref 3.5–5.0)
Alkaline Phosphatase: 47 U/L (ref 38–126)
Anion gap: 7 (ref 5–15)
BUN: 10 mg/dL (ref 6–20)
CO2: 22 mmol/L (ref 22–32)
Calcium: 9.1 mg/dL (ref 8.9–10.3)
Chloride: 109 mmol/L (ref 98–111)
Creatinine, Ser: 1.06 mg/dL — ABNORMAL HIGH (ref 0.44–1.00)
GFR, Estimated: >60 mL/min (ref >60)
Glucose, Bld: 167 mg/dL — ABNORMAL HIGH (ref 70–99)
Potassium: 3.7 mmol/L (ref 3.5–5.1)
Sodium: 138 mmol/L (ref 135–145)
Total Bilirubin: 0.5 mg/dL (ref 0.3–1.2)
Total Protein: 7.1 g/dL (ref 6.5–8.1)

## 2021-09-09 LAB — SALICYLATE LEVEL: Salicylate Lvl: <7 mg/dL — ABNORMAL LOW (ref 7.0–30.0)

## 2021-09-09 LAB — RAPID URINE DRUG SCREEN, HOSP PERFORMED
Amphetamines: NOT DETECTED
Barbiturates: POSITIVE — AB
Benzodiazepines: NOT DETECTED
Cocaine: NOT DETECTED
Opiates: NOT DETECTED
Tetrahydrocannabinol: NOT DETECTED

## 2021-09-09 LAB — ACETAMINOPHEN LEVEL: Acetaminophen (Tylenol), Serum: <10 ug/mL — ABNORMAL LOW (ref 10–30)

## 2021-09-09 LAB — ETHANOL: Alcohol, Ethyl (B): <10 mg/dL (ref <10)

## 2021-09-09 LAB — I-STAT BETA HCG BLOOD, ED (MC, WL, AP ONLY): I-stat hCG, quantitative: <5 m[IU]/mL (ref <5)

## 2021-09-09 LAB — LITHIUM LEVEL: Lithium Lvl: 0.62 mmol/L (ref 0.60–1.20)

## 2021-09-09 NOTE — ED Triage Notes (Signed)
Pt here from home screaming, crying & yelling in triage about how she can't be left alone. Pt endorse SI, no HI. When RN asked if pt had a plan she pointed to a scar on her forearm. Pt was sitting in lobby on the phone, and started screaming hysterically at staff. Pt brought back to triage where she continued this. Triage RN stated we could talk when she calmed down. Pt stated "I need a group home and I've been waiting for months and I overdosed 7 months ago and my boyfriend I think uses crack and I can't take it anymore." Pt refused to answer any further questions. ?

## 2021-09-09 NOTE — ED Notes (Signed)
Pt belongings placed in locker number 1 

## 2021-09-09 NOTE — ED Notes (Signed)
RN reviewed discharge instructions w/ pt. Pt calm and cooperative w/ staff, pt denies SI/Hi state she "was having a night". PT had no further questions ?

## 2021-09-09 NOTE — Discharge Instructions (Signed)
Continue with your medications as prescribed.  Follow-up with your care team.  Return to the emergency room at anytime for concerning symptoms. ?
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
## 2021-09-09 NOTE — ED Notes (Signed)
Pt given belongings from locker 1  ?

## 2021-09-11 ENCOUNTER — Ambulatory Visit (HOSPITAL_COMMUNITY)
Admission: EM | Admit: 2021-09-11 | Discharge: 2021-09-11 | Disposition: A | Discharge status: Home / Self Care | Payer: Medicaid Other | Attending: Physician Assistant | Admitting: Physician Assistant

## 2021-09-11 ENCOUNTER — Encounter (HOSPITAL_COMMUNITY): Payer: Self-pay

## 2021-09-11 DIAGNOSIS — Z794 Long term (current) use of insulin: Secondary | ICD-10-CM

## 2021-09-11 DIAGNOSIS — E11638 Type 2 diabetes mellitus with other oral complications: Secondary | ICD-10-CM | POA: Diagnosis not present

## 2021-09-11 DIAGNOSIS — B370 Candidal stomatitis: Secondary | ICD-10-CM | POA: Diagnosis not present

## 2021-09-11 LAB — CBG MONITORING, ED: Glucose-Capillary: 130 mg/dL — ABNORMAL HIGH (ref 70–99)

## 2021-09-11 MED ORDER — NYSTATIN 100000 UNIT/ML MT SUSP: 5.0000 mL | Freq: Four times a day (QID) | OROMUCOSAL | 0 refills | Status: DC

## 2021-09-11 NOTE — ED Provider Notes (Signed)
[facility]    CSN: [id number] Arrival date & time: 09/11/21  1216      History   Chief Complaint Chief Complaint  Patient presents with   Thrush    HPI [name] is a 31 y.o. female.   31 year old female presents with thrush.  Patient has a history of having diabetes type 2, is on Levemir insulin therapy.  Patient relates for the past week she has been having painful mouth, white areas along the inside of the mouth and the tongue, with tenderness, and mild burning.  Patient relates she takes multiple medications, some for diabetes, and some for her depression, bipolar disorder, schizophrenia.  Patient relates that she is drinking sodas, juices, and correlates.  Patient relates the last time she checked her glucose was about a week ago and it was 165.  Patient relates she does not have any difficulty swallowing, and relates no fever or chills.    Past Medical History:  Diagnosis Date   Anxiety    Bipolar 1 disorder ([place])    Cognitive deficits    Depression    Diabetes mellitus without complication ([place])    Hypertension    Mental disorder    Mental health disorder    Obesity     Patient Active Problem List   Diagnosis Date Noted   Suicidal overdose ([place]) 03/23/2021   Syphilis 07/15/2020   Malingering 06/05/2020   Gastroesophageal reflux disease 05/04/2020   Hyperglycemia due to type 2 diabetes mellitus ([place]) 05/04/2020   Long term (current) use of insulin ([place]) 05/04/2020   Migraine without aura 05/04/2020   Morbid obesity ([place]) 05/04/2020   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 2 diabetes mellitus ([place]) 05/04/2020   Prolapsed internal hemorrhoids 05/04/2020   Vitamin D deficiency 05/04/2020   Other symptoms and signs involving cognitive functions and awareness 05/04/2020   Suicide attempt ([place])    Anxiety state 03/06/2020   Schizophrenia ([place]) 09/13/2019   Bipolar I disorder, most recent episode depressed ([place]) 06/23/2019   MDD (major depressive disorder)  10/10/2018   Schizoaffective disorder, bipolar type ([place]) 09/25/2018   Affective psychosis, bipolar ([place]) 06/13/2018   HTN (hypertension) 05/03/2018   Tobacco use disorder 05/03/2018   Adjustment disorder with emotional disturbance 01/02/2018   Schizophrenia, 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   Moderate bipolar I disorder, most recent episode depressed ([place])    Psychosis ([place])    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/2017   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   OCD (obsessive compulsive disorder) 10/05/2016   Major depressive disorder, recurrent episode, mild ([place]) 05/04/2016   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   Learning disability 07/18/2015   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/2015   Diabetes mellitus ([place]) 07/18/2015   MDD (major depressive disorder), recurrent, severe, 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   Hyperlipidemia 07/18/2015   Severe episode of recurrent major depressive disorder, without psychotic features ([place])    Suicidal ideation    Intentional overdose ([place])    Cognitive deficits 10/12/2012   Generalized anxiety disorder 06/28/2012    Past Surgical History:  Procedure Laterality Date   CESAREAN SECTION     CESAREAN SECTION N/A 04/25/2013   Procedure: REPEAT CESAREAN SECTION;  Surgeon: [name], MD;  Location: [facility];  Service: Obstetrics;  Laterality: N/A;   MASS EXCISION N/A 06/03/2012   Procedure: EXCISION MASS;  Surgeon: [name], MD;  Location: [facility];  Service: ENT;  Laterality: N/A;  Excision uvula mass   TONSILLECTOMY N/A 06/03/2012  Procedure: TONSILLECTOMY;  Surgeon: [name], MD;  Location: [facility];  Service: ENT;  Laterality: N/A;   TONSILLECTOMY      OB History     Gravida  3   Para  3   Term  3   Preterm  0   AB  0   Living  3      SAB  0   IAB  0   Ectopic  0   Multiple      Live Births  3            Home Medications    Prior to  Admission medications   Medication Sig Start Date End Date Taking? Authorizing Provider  magic mouthwash (nystatin, lidocaine, diphenhydrAMINE, alum & mag hydroxide) suspension Swish and spit 5 mLs 4 (four) times daily. 09/11/21  Yes Nyoka Lint, PA-C  albuterol (VENTOLIN HFA) 108 (90 Base) MCG/ACT inhaler Inhale 1-2 puffs into the lungs every 6 (six) hours as needed for wheezing or shortness of breath. 01/11/21   Pearson Forster, NP  amLODipine (NORVASC) 5 MG tablet Take 5 mg by mouth daily.    [provider]  cholecalciferol (VITAMIN D) 25 MCG (1000 UNIT) tablet Take 1,000 Units by mouth daily. 04/30/20   [provider]  glimepiride (AMARYL) 4 MG tablet Take 4 mg by mouth daily.    [provider]  guanFACINE (INTUNIV) 1 MG TB24 ER tablet Take 1 mg by mouth at bedtime.    [provider]  haloperidol (HALDOL) 10 MG tablet Take 10-20 mg by mouth See admin instructions. 10 mg in the morning  20 mg at bedtime 05/09/20   [provider]  haloperidol decanoate (HALDOL DECANOATE) 100 MG/ML injection Inject 150 mg into the muscle every 28 (twenty-eight) days. Inject '150mg'$  (1.3m) every 28 days.    [provider]  hydrOXYzine (VISTARIL) 50 MG capsule Take 50 mg by mouth at bedtime as needed for anxiety.    [provider]  insulin detemir (LEVEMIR) 100 UNIT/ML injection Inject 0.4 mLs (40 Units total) into the skin 2 (two) times daily. 05/05/20   BMontine Circle PA-C  lithium carbonate (ESKALITH) 450 MG CR tablet Take 450 mg by mouth 2 (two) times daily.    [provider]  metoprolol tartrate (LOPRESSOR) 25 MG tablet Take 0.5 tablets (12.5 mg total) by mouth 2 (two) times daily. 03/28/21   FCharlynne Cousins MD  OLANZapine zydis (ZYPREXA) 10 MG disintegrating tablet Take 10 mg by mouth every 8 (eight) hours as needed (agitation).    [provider]  omeprazole (PRILOSEC) 20 MG capsule Take 20 mg by mouth daily.    [provider]  primidone (MYSOLINE) 50 MG tablet Take 100 mg by mouth 3 (three) times daily.    [provider]  sertraline (ZOLOFT) 50 MG tablet Take 50 mg by mouth daily.    [provider]  simvastatin (ZOCOR) 20 MG tablet Take 20 mg by mouth daily.    [provider]  vitamin B-12 (CYANOCOBALAMIN) 1000 MCG tablet Take 1,000 mcg by mouth daily. 11/13/19   [provider]    Family History Family History  Problem Relation Age of Onset   Hypertension Mother    Diabetes Father     Social History Social History   Tobacco Use   Smoking status: Every Day    Types: Cigars   Smokeless tobacco: Never   Tobacco comments:    Pt declined  Vaping Use   Vaping Use: Never used  Substance Use Topics   Alcohol use: Not Currently   Drug use: Not Currently    Types: "Crack" cocaine, Other-see comments    Comment: Patient reports hx of smoking Crack     Allergies   Wellbutrin [bupropion], Omnipaque [iohexol], Contrast media [iodinated contrast media], Depakote [divalproex sodium], Hydroxyzine, and Penicillin g   Review of Systems Review of Systems  HENT:  Positive for mouth sores (tongue, mouth).     Physical Exam Triage Vital Signs ED Triage Vitals [09/11/21 1316]  Enc Vitals Group     BP 133/82     Pulse Rate 93     Resp 18     Temp 98.4 F (36.9 C)     Temp Source Oral     SpO2 100 %     Weight      Height      Head Circumference      Peak Flow      Pain Score      Pain Loc      Pain Edu?      Excl. in [place]?    No data found.  Updated Vital Signs BP 133/82 (BP Location: Left Arm)   Pulse 93   Temp 98.4 F (36.9 C) (Oral)   Resp 18   LMP 09/06/2021   SpO2 100%   Visual Acuity Right Eye Distance:   Left Eye Distance:   Bilateral Distance:    Right Eye Near:   Left Eye Near:    Bilateral Near:     Physical Exam Constitutional:      Appearance: Normal appearance.  HENT:     Right Ear: Tympanic membrane and ear canal  normal.     Left Ear: Tympanic membrane and ear canal normal.     Mouth/Throat:     Mouth: Mucous membranes are moist.     Pharynx: Oropharyngeal exudate (tongue) and posterior oropharyngeal erythema present.     Comments: Mouth: Tongue has geographic presents with some white exudate present along different areas of the tongue.  There is no swelling.  The pharynx and buccal mucosa have slight redness without swelling. Cardiovascular:     Rate and Rhythm: Normal rate and regular rhythm.     Heart sounds: Normal heart sounds.  Pulmonary:     Effort: Pulmonary effort is normal.     Breath sounds: Normal breath sounds and air entry. No wheezing, rhonchi or rales.  Neurological:     Mental Status: She is alert.     UC Treatments / Results  Labs (all labs ordered are listed, but only abnormal results are displayed) Labs Reviewed  CBG MONITORING, ED - Abnormal; Notable for the following components:      Result Value   Glucose-Capillary 130 (*)    All other components within normal limits  Finger stick Glucose: 130  EKG   Radiology No results found.  Procedures Procedures (including critical care time)  Medications Ordered in UC Medications - No data to display  Initial Impression / Assessment and Plan / UC Course  I have reviewed the triage vital signs and the nursing notes.  Pertinent labs & imaging results that were available during my care of the patient were reviewed by me and considered in my medical decision making (see chart for details).    Plan: Advised to use the Magic mouthwash gargle for 60 seconds and then spit 4-6 times a day over the next 2 to 3  days. Advised patient to stop drinking juices, colas, and correlates. Patient to follow-up with her PCP if symptoms fail to improve. Final Clinical Impressions(s) / UC Diagnoses   Final diagnoses:  Thrush, oral  Type 2 diabetes mellitus with other specified complication, with long-term current use of insulin ([place])      Discharge Instructions      Advised to stop drinking sweet drinks, sodas, juices, cool-aides.    ED Prescriptions     Medication Sig Dispense Auth. Provider   magic mouthwash (nystatin, lidocaine, diphenhydrAMINE, alum & mag hydroxide) suspension Swish and spit 5 mLs 4 (four) times daily. 180 mL Nyoka Lint, PA-C      PDMP not reviewed this encounter.   Nyoka Lint, PA-C 09/11/21 1401

## 2021-09-11 NOTE — ED Triage Notes (Signed)
Pt c/o white patches to her tongue with burning x2wks.   Pt states feels sleepy all the time, thinks its her psych meds.

## 2021-09-11 NOTE — Discharge Instructions (Signed)
Advised to stop drinking sweet drinks, sodas, juices, cool-aides.

## 2021-09-26 ENCOUNTER — Other Ambulatory Visit: Payer: Self-pay

## 2021-09-26 ENCOUNTER — Encounter (HOSPITAL_COMMUNITY): Payer: Self-pay

## 2021-09-26 ENCOUNTER — Emergency Department (HOSPITAL_COMMUNITY)
Admission: EM | Admit: 2021-09-26 | Discharge: 2021-09-26 | Disposition: A | Discharge status: Home / Self Care | Payer: Medicaid Other | Attending: Emergency Medicine | Admitting: Emergency Medicine

## 2021-09-26 DIAGNOSIS — R531 Weakness: Secondary | ICD-10-CM | POA: Insufficient documentation

## 2021-09-26 DIAGNOSIS — Z794 Long term (current) use of insulin: Secondary | ICD-10-CM | POA: Diagnosis not present

## 2021-09-26 DIAGNOSIS — R45851 Suicidal ideations: Secondary | ICD-10-CM | POA: Insufficient documentation

## 2021-09-26 DIAGNOSIS — Z043 Encounter for examination and observation following other accident: Secondary | ICD-10-CM | POA: Diagnosis not present

## 2021-09-26 NOTE — ED Triage Notes (Signed)
Patient reports that her psychiatrist was suppose to change her psych meds, but has not and a month has passed. Patient states she is argumentative with people and feeling suicidal. Patient's plan is to overdose on her own medication.  Patient denies any alcohol or drug use.

## 2021-09-26 NOTE — ED Provider Notes (Signed)
[facility] DEPT Provider Note   CSN: [id number] Arrival date & time: 09/26/21  1617     History  Chief Complaint  Patient presents with   Suicidal    [name] is a 31 y.o. female with frequent emergency department visits, bipolar, schizophrenia who presents with concern for feeling weak over the last month.  Patient had initially told nursing that she has had some trouble with her psych meds and had had some suicidal ideation and had thoughts of overdosing on her medications.  Patient reports that this time that she does not actually have any suicidal ideation, desire to hurt herself.  She denies any other issues at this time.  HPI     Home Medications Prior to Admission medications   Medication Sig Start Date End Date Taking? Authorizing Provider  albuterol (VENTOLIN HFA) 108 (90 Base) MCG/ACT inhaler Inhale 1-2 puffs into the lungs every 6 (six) hours as needed for wheezing or shortness of breath. 01/11/21   Pearson Forster, NP  amLODipine (NORVASC) 5 MG tablet Take 5 mg by mouth daily.    [provider]  cholecalciferol (VITAMIN D) 25 MCG (1000 UNIT) tablet Take 1,000 Units by mouth daily. 04/30/20   [provider]  glimepiride (AMARYL) 4 MG tablet Take 4 mg by mouth daily.    [provider]  guanFACINE (INTUNIV) 1 MG TB24 ER tablet Take 1 mg by mouth at bedtime.    [provider]  haloperidol (HALDOL) 10 MG tablet Take 10-20 mg by mouth See admin instructions. 10 mg in the morning  20 mg at bedtime 05/09/20   [provider]  haloperidol decanoate (HALDOL DECANOATE) 100 MG/ML injection Inject 150 mg into the muscle every 28 (twenty-eight) days. Inject '150mg'$  (1.83m) every 28 days.    [provider]  hydrOXYzine (VISTARIL) 50 MG capsule Take 50 mg by mouth at bedtime as needed for anxiety.    [provider]  insulin detemir (LEVEMIR) 100 UNIT/ML injection Inject 0.4 mLs (40 Units  total) into the skin 2 (two) times daily. 05/05/20   BMontine Circle PA-C  lithium carbonate (ESKALITH) 450 MG CR tablet Take 450 mg by mouth 2 (two) times daily.    [provider]  magic mouthwash (nystatin, lidocaine, diphenhydrAMINE, alum & mag hydroxide) suspension Swish and spit 5 mLs 4 (four) times daily. 09/11/21   JNyoka Lint PA-C  metoprolol tartrate (LOPRESSOR) 25 MG tablet Take 0.5 tablets (12.5 mg total) by mouth 2 (two) times daily. 03/28/21   FCharlynne Cousins MD  OLANZapine zydis (ZYPREXA) 10 MG disintegrating tablet Take 10 mg by mouth every 8 (eight) hours as needed (agitation).    [provider]  omeprazole (PRILOSEC) 20 MG capsule Take 20 mg by mouth daily.    [provider]  primidone (MYSOLINE) 50 MG tablet Take 100 mg by mouth 3 (three) times daily.    [provider]  sertraline (ZOLOFT) 50 MG tablet Take 50 mg by mouth daily.    [provider]  simvastatin (ZOCOR) 20 MG tablet Take 20 mg by mouth daily.    [provider]  vitamin B-12 (CYANOCOBALAMIN) 1000 MCG tablet Take 1,000 mcg by mouth daily. 11/13/19   [provider]      Allergies    Wellbutrin [bupropion], Omnipaque [iohexol], Contrast media [iodinated contrast media], Depakote [divalproex sodium], Hydroxyzine, and Penicillin g    Review of Systems   Review of Systems  All other systems  reviewed and are negative.  Physical Exam Updated Vital Signs BP (!) 151/113 (BP Location: Left Arm)   Pulse (!) 110   Temp 99.1 F (37.3 C) (Oral)   Resp 18   Ht '5\' 7"'$  (1.702 m)   Wt 117.9 kg   LMP 09/06/2021   SpO2 99%   BMI 40.72 kg/m  Physical Exam Vitals and nursing note reviewed.  Constitutional:      General: She is not in acute distress.    Appearance: Normal appearance.  HENT:     Head: Normocephalic and atraumatic.  Eyes:     General:        Right eye: No discharge.        Left eye: No discharge.  Cardiovascular:     Rate and  Rhythm: Normal rate and regular rhythm.  Pulmonary:     Effort: Pulmonary effort is normal. No respiratory distress.  Musculoskeletal:        General: No deformity.  Skin:    General: Skin is warm and dry.  Neurological:     Mental Status: She is alert and oriented to person, place, and time.     Comments: Patient with normal affect, no evidence of response to internal stimuli, no inappropriate speech.  Psychiatric:        Mood and Affect: Mood normal.        Behavior: Behavior normal.    ED Results / Procedures / Treatments   Labs (all labs ordered are listed, but only abnormal results are displayed) Labs Reviewed - No data to display  EKG None  Radiology No results found.  Procedures Procedures    Medications Ordered in ED Medications - No data to display  ED Course/ Medical Decision Making/ A&P                           Medical Decision Making  This is a patient with frequent emergency department visits, schizophrenia, bipolar disorder who presents initially with concern for need for medication change, and had endorsed suicidal ideation to the triage nurse.  On my evaluation patient denies SI, HI, AVH.  She has been seen for overdose in the past, however does not currently have medications with her, reports that she does not have intention to kill herself.  She reports that she has things to live for including kids, positive future planning.  She has normal affect, no evidence of response to internal stimuli, and answers are logical at this time. She endorses that she will return if she begins to have thoughts of hurting herself or others.  She endorses that she currently has a psychiatrist that she sees regularly and has plans to see him early next week.  Patient has continued to deny active SI, HI, AVH during my entire evaluation, had 1 non corroborated incidence of SI. In context of these findings I think it is reasonable to allow patient to leave with extensive return  precautions at this time.  Final Clinical Impression(s) / ED Diagnoses Final diagnoses:  None    Rx / DC Orders ED Discharge Orders     None         Dorien Chihuahua 09/26/21 1641    Lacretia Leigh, MD 09/27/21 1606

## 2021-09-28 ENCOUNTER — Ambulatory Visit (HOSPITAL_COMMUNITY)
Admission: EM | Admit: 2021-09-28 | Discharge: 2021-09-29 | Disposition: A | Discharge status: Other Acute Inpatient Hospital | Payer: Medicaid Other | Attending: Urology | Admitting: Urology

## 2021-09-28 DIAGNOSIS — Z9151 Personal history of suicidal behavior: Secondary | ICD-10-CM | POA: Diagnosis not present

## 2021-09-28 DIAGNOSIS — Z20822 Contact with and (suspected) exposure to covid-19: Secondary | ICD-10-CM | POA: Insufficient documentation

## 2021-09-28 DIAGNOSIS — F17290 Nicotine dependence, other tobacco product, uncomplicated: Secondary | ICD-10-CM | POA: Insufficient documentation

## 2021-09-28 DIAGNOSIS — F250 Schizoaffective disorder, bipolar type: Secondary | ICD-10-CM | POA: Diagnosis not present

## 2021-09-28 DIAGNOSIS — R45851 Suicidal ideations: Secondary | ICD-10-CM | POA: Diagnosis not present

## 2021-09-28 LAB — CBC WITH DIFFERENTIAL/PLATELET
Abs Immature Granulocytes: 0.03 10*3/uL (ref 0.00–0.07)
Basophils Absolute: 0.1 10*3/uL (ref 0.0–0.1)
Basophils Relative: 1 %
Eosinophils Absolute: 0.4 10*3/uL (ref 0.0–0.5)
Eosinophils Relative: 4 %
HCT: 31.1 % — ABNORMAL LOW (ref 36.0–46.0)
Hemoglobin: 10 g/dL — ABNORMAL LOW (ref 12.0–15.0)
Immature Granulocytes: 0 %
Lymphocytes Relative: 36 %
Lymphs Abs: 3.5 10*3/uL (ref 0.7–4.0)
MCH: 25.6 pg — ABNORMAL LOW (ref 26.0–34.0)
MCHC: 32.2 g/dL (ref 30.0–36.0)
MCV: 79.7 fL — ABNORMAL LOW (ref 80.0–100.0)
Monocytes Absolute: 0.7 10*3/uL (ref 0.1–1.0)
Monocytes Relative: 7 %
Neutro Abs: 5.2 10*3/uL (ref 1.7–7.7)
Neutrophils Relative %: 52 %
Platelets: 211 10*3/uL (ref 150–400)
RBC: 3.9 MIL/uL (ref 3.87–5.11)
RDW: 15.6 % — ABNORMAL HIGH (ref 11.5–15.5)
WBC: 9.8 10*3/uL (ref 4.0–10.5)
nRBC: 0 % (ref 0.0–0.2)

## 2021-09-28 LAB — POCT URINE DRUG SCREEN - MANUAL ENTRY (I-SCREEN)
POC Amphetamine UR: NOT DETECTED
POC Buprenorphine (BUP): NOT DETECTED
POC Cocaine UR: NOT DETECTED
POC Marijuana UR: NOT DETECTED
POC Methadone UR: NOT DETECTED
POC Methamphetamine UR: NOT DETECTED
POC Morphine: NOT DETECTED
POC Oxazepam (BZO): NOT DETECTED
POC Oxycodone UR: NOT DETECTED
POC Secobarbital (BAR): POSITIVE — AB

## 2021-09-28 LAB — POC SARS CORONAVIRUS 2 AG: SARSCOV2ONAVIRUS 2 AG: NEGATIVE

## 2021-09-28 LAB — POCT PREGNANCY, URINE: Preg Test, Ur: NEGATIVE

## 2021-09-28 LAB — LITHIUM LEVEL: Lithium Lvl: 0.41 mmol/L — ABNORMAL LOW (ref 0.60–1.20)

## 2021-09-28 LAB — HEMOGLOBIN A1C
Hgb A1c MFr Bld: 5.7 % — ABNORMAL HIGH (ref 4.8–5.6)
Mean Plasma Glucose: 116.89 mg/dL

## 2021-09-28 LAB — ETHANOL: Alcohol, Ethyl (B): <10 mg/dL (ref <10)

## 2021-09-28 MED ORDER — MAGNESIUM HYDROXIDE 400 MG/5ML PO SUSP: 30.0000 mL | Freq: Every day | ORAL | Status: DC | PRN

## 2021-09-28 MED ORDER — TRAZODONE HCL 50 MG PO TABS: 50.0000 mg | ORAL_TABLET | Freq: Every evening | ORAL | Status: DC | PRN

## 2021-09-28 MED ORDER — ALUM & MAG HYDROXIDE-SIMETH 200-200-20 MG/5ML PO SUSP: 30.0000 mL | ORAL | Status: DC | PRN

## 2021-09-28 MED ORDER — ACETAMINOPHEN 325 MG PO TABS: 650.0000 mg | ORAL_TABLET | Freq: Four times a day (QID) | ORAL | Status: DC | PRN

## 2021-09-28 NOTE — ED Provider Notes (Signed)
[facility] Urgent Care Continuous Assessment Admission H&P  Date: 09/28/21 Patient Name: [name] MRN: [id number] Chief Complaint: No chief complaint on file.     Diagnoses:  Final diagnoses:  Schizoaffective disorder, bipolar type ([place])    HPI: [name] is a 31 year old female with psychiatric history of Schizoaffective disorder, bipolar type and suicidal ideation.  Patient presented voluntarily via Event organiser.  Patient presented reporting worsening agitation and suicidal ideation.  Patient was seen face-to-face and her chart was reviewed by this nurse practitioner.  On assessment, patient is noted to be agitated, screaming loudly, and crying inconsolably. Patient is screaming and gets upset with staffs and this Probation officer for no apparent reason. Patient had to be redirected several times and reassure that staffs are not arguing with her and for her to lower her voice. Patient tearfully reports " I am tired, my medications are not working, and my mom and boyfriend are angry at me and I need to get some help." Patient reports that he has been arguing and cursing at her boyfriend and mother for no apparent reason. She says that she has been agitated and describes her mood as "angry and irritable."  She reports that her mother is currently not speaking to her due to her behaviors. Patient says that she is experiencing suicidal ideation due to her medication not working ineffective. She says she will commit suicidal if discharged home tonight. She shares that she has history of suicidal attempt via overdose. She denies homicidal ideations. She endorses auditory hallucination however she is unable to describe her hallucination. She endorses paranoia and states" people are following me and they are out to get me."  She denies substance abuse. She reports that she currently lives at home with her boyfriend.   Patient reports that she has an [employer] Risk manager) and that they give her her  monthly Hadol injection. She says she last had an injection about 2 weeks ago. She says she sees [name] for medication management.   PHQ 2-9:  Flowsheet Row Clinical Support from 07/05/2020 in [facility] ED from 05/26/2020 in [place] DEPT ED from 05/22/2020 in [facility]  Thoughts that you would be better off dead, or of hurting yourself in some way Not at all More than half the days Nearly every day  PHQ-9 Total Score '4 18 19       '$ Flowsheet Row ED from 09/26/2021 in [place] DEPT ED from 09/11/2021 in [facility] ED from 08/29/2021 in [place] DEPT  C-SSRS RISK CATEGORY High Risk No Risk No Risk        Total Time spent with patient: 15 minutes  Musculoskeletal  Strength & Muscle Tone: within normal limits Gait & Station: normal Patient leans: Right  Psychiatric Specialty Exam  Presentation General Appearance: Appropriate for Environment  Eye Contact:Good  Speech:Clear and Coherent  Speech Volume:Increased  Handedness:Right   Mood and Affect  Mood:Irritable  Affect:Congruent   Thought Process  Thought Processes:Disorganized  Descriptions of Associations:Intact  Orientation:Full (Time, Place and Person)  Thought Content:Paranoid Ideation  Diagnosis of Schizophrenia or Schizoaffective disorder in past: Yes  Duration of Psychotic Symptoms: Greater than six months  Hallucinations:Hallucinations: None  Ideas of Reference:None  Suicidal Thoughts:Suicidal Thoughts: Yes, Active SI Active Intent and/or Plan: With Plan  Homicidal Thoughts:Homicidal Thoughts: No   Sensorium  Memory:Immediate Good; Recent Fair;  Remote Fair  Judgment:Poor  Insight:Fair   Executive Functions  Concentration:Fair  Attention Span:Fair  Recall:Good  Fund of  Knowledge:Fair  Language:Fair   Psychomotor Activity  Psychomotor Activity:Psychomotor Activity: Normal   Assets  Assets:Communication Skills; Desire for Improvement; Social Support; Physical Health   Sleep  Sleep:Sleep: Fair Number of Hours of Sleep: 6   Nutritional Assessment (For OBS and FBC admissions only) Has the patient had a weight loss or gain of 10 pounds or more in the last 3 months?: No Has the patient had a decrease in food intake/or appetite?: No Does the patient have dental problems?: No Does the patient have eating habits or behaviors that may be indicators of an eating disorder including binging or inducing vomiting?: No Has the patient recently lost weight without trying?: 0 Has the patient been eating poorly because of a decreased appetite?: 0 Malnutrition Screening Tool Score: 0    Physical Exam Vitals and nursing note reviewed.  Constitutional:      General: She is not in acute distress.    Appearance: Normal appearance. She is well-developed.  HENT:     Head: Normocephalic and atraumatic.  Eyes:     Conjunctiva/sclera: Conjunctivae normal.  Cardiovascular:     Rate and Rhythm: Normal rate and regular rhythm.  Pulmonary:     Effort: Pulmonary effort is normal. No respiratory distress.     Breath sounds: Normal breath sounds.  Abdominal:     Palpations: Abdomen is soft.     Tenderness: There is no abdominal tenderness.  Musculoskeletal:        General: No swelling.     Cervical back: Normal range of motion.  Skin:    General: Skin is warm and dry.     Capillary Refill: Capillary refill takes less than 2 seconds.  Neurological:     Mental Status: She is alert and oriented to person, place, and time.  Psychiatric:        Attention and Perception: Attention and perception normal.        Mood and Affect: Mood normal.        Speech: Speech normal.        Behavior: Behavior is agitated and aggressive. Behavior is cooperative.        Thought  Content: Thought content is paranoid. Thought content is not delusional. Thought content includes suicidal ideation. Thought content does not include homicidal ideation. Thought content includes suicidal plan. Thought content does not include homicidal plan.        Judgment: Judgment is impulsive.   Review of Systems  Constitutional: Negative.   HENT: Negative.    Eyes: Negative.   Respiratory: Negative.    Cardiovascular: Negative.   Gastrointestinal: Negative.   Genitourinary: Negative.   Musculoskeletal: Negative.   Skin: Negative.   Neurological: Negative.   Endo/Heme/Allergies: Negative.   Psychiatric/Behavioral:  Positive for hallucinations and suicidal ideas. The patient is nervous/anxious.    Blood pressure (!) 150/97, pulse 89, temperature 99.1 F (37.3 C), temperature source Oral, resp. rate 18, last menstrual period 09/06/2021, SpO2 100 %. There is no height or weight on file to calculate BMI.  Past Psychiatric History:    Is the patient at risk to self? Yes  Has the patient been a risk to self in the past 6 months? Yes .    Has the patient been a risk to self within the distant past? Yes   Is the patient a risk to others? No   Has the patient been  a risk to others in the past 6 months? No   Has the patient been a risk to others within the distant past? Yes   Past Medical History:  Past Medical History:  Diagnosis Date   Anxiety    Bipolar 1 disorder ([place])    Cognitive deficits    Depression    Diabetes mellitus without complication ([place])    Hypertension    Mental disorder    Mental health disorder    Obesity     Past Surgical History:  Procedure Laterality Date   CESAREAN SECTION     CESAREAN SECTION N/A 04/25/2013   Procedure: REPEAT CESAREAN SECTION;  Surgeon: [name], MD;  Location: [facility];  Service: Obstetrics;  Laterality: N/A;   MASS EXCISION N/A 06/03/2012   Procedure: EXCISION MASS;  Surgeon: [name], MD;  Location: [facility];  Service: ENT;  Laterality: N/A;  Excision uvula mass   TONSILLECTOMY N/A 06/03/2012   Procedure: TONSILLECTOMY;  Surgeon: [name], MD;  [facility];  Service: ENT;  Laterality: N/A;   TONSILLECTOMY      Family History:  Family History  Problem Relation Age of Onset   Hypertension Mother    Diabetes Father     Social History:  Social History   Socioeconomic History   Marital status: Single    Spouse name: Not on file   Number of children: Not on file   Years of education: Not on file   Highest education level: Not on file  Occupational History   Not on file  Tobacco Use   Smoking status: Every Day    Types: Cigars   Smokeless tobacco: Never   Tobacco comments:    Pt declined  Vaping Use   Vaping Use: Never used  Substance and Sexual Activity   Alcohol use: Not Currently   Drug use: Not Currently    Types: "Crack" cocaine, Other-see comments    Comment: Patient reports hx of smoking Crack   Sexual activity: Not Currently    Birth control/protection: None    Comment: occasionally  Other Topics Concern   Not on file  Social History Narrative   ** Merged History Encounter **       Social Determinants of Health   Financial Resource Strain: Not on file  Food Insecurity: Not on file  Transportation Needs: Not on file  Physical Activity: Not on file  Stress: Not on file  Social Connections: Not on file  Intimate Partner Violence: Not on file    SDOH:  SDOH Screenings   Alcohol Screen: Not on file  Depression (PHQ2-9): Not on file  Financial Resource Strain: Not on file  Food Insecurity: Not on file  Housing: Not on file  Physical Activity: Not on file  Social Connections: Not on file  Stress: Not on file  Tobacco Use: High Risk   Smoking Tobacco Use: Every Day   Smokeless Tobacco Use: Never   Passive Exposure: Not on file  Transportation Needs: Not on file    Last Labs:  Admission on 09/11/2021, Discharged on  09/11/2021  Component Date Value Ref Range Status   Glucose-Capillary 09/11/2021 130 (H)  70 - 99 mg/dL Final   Glucose reference range applies only to samples taken after fasting for at least 8 hours.   Comment 1 09/11/2021 Document in Chart   Final  Admission on 08/29/2021, Discharged on 08/29/2021  Component Date Value Ref Range Status   Preg Test, Ur  08/29/2021 NEGATIVE  NEGATIVE Final   Comment:        THE SENSITIVITY OF THIS METHODOLOGY IS >20 mIU/mL. Performed at [facility], [place] [address], [place], [place] [address]    Sodium 08/29/2021 140  135 - 145 mmol/L Final   Potassium 08/29/2021 3.5  3.5 - 5.1 mmol/L Final   Chloride 08/29/2021 103  98 - 111 mmol/L Final   BUN 08/29/2021 5 (L)  6 - 20 mg/dL Final   Creatinine, Ser 08/29/2021 0.70  0.44 - 1.00 mg/dL Final   Glucose, Bld 08/29/2021 113 (H)  70 - 99 mg/dL Final   Glucose reference range applies only to samples taken after fasting for at least 8 hours.   Calcium, Ion 08/29/2021 1.17  1.15 - 1.40 mmol/L Final   TCO2 08/29/2021 28  22 - 32 mmol/L Final   Hemoglobin 08/29/2021 13.3  12.0 - 15.0 g/dL Final   HCT 08/29/2021 39.0  36.0 - 46.0 % Final   I-stat hCG, quantitative 08/29/2021 <5.0  <5 mIU/mL Final   Comment 3 08/29/2021          Final   Comment:   GEST. AGE      CONC.  (mIU/mL)   <=1 WEEK        5 - 50     2 WEEKS       50 - 500     3 WEEKS       100 - 10,000     4 WEEKS     1,000 - 30,000        FEMALE AND NON-PREGNANT FEMALE:     LESS THAN 5 mIU/mL    Total Protein 08/29/2021 8.5 (H)  6.5 - 8.1 g/dL Final   Albumin 08/29/2021 4.5  3.5 - 5.0 g/dL Final   AST 08/29/2021 19  15 - 41 U/L Final   ALT 08/29/2021 16  0 - 44 U/L Final   Alkaline Phosphatase 08/29/2021 59  38 - 126 U/L Final   Total Bilirubin 08/29/2021 0.5  0.3 - 1.2 mg/dL Final   Bilirubin, Direct 08/29/2021 0.1  0.0 - 0.2 mg/dL Final   Indirect Bilirubin 08/29/2021 0.4  0.3 - 0.9 mg/dL Final   Performed at [facility] [place] [address] [place], [place] [address]   WBC 08/29/2021 10.6 (H)  4.0 - 10.5 K/uL Final   RBC 08/29/2021 4.58  3.87 - 5.11 MIL/uL Final   Hemoglobin 08/29/2021 11.7 (L)  12.0 - 15.0 g/dL Final   HCT 08/29/2021 36.7  36.0 - 46.0 % Final   MCV 08/29/2021 80.1  80.0 - 100.0 fL Final   MCH 08/29/2021 25.5 (L)  26.0 - 34.0 pg Final   MCHC 08/29/2021 31.9  30.0 - 36.0 g/dL Final   RDW 08/29/2021 16.5 (H)  11.5 - 15.5 % Final   Platelets 08/29/2021 194  150 - 400 K/uL Final   nRBC 08/29/2021 0.0  0.0 - 0.2 % Final   Neutrophils Relative % 08/29/2021 71  % Final   Neutro Abs 08/29/2021 7.5  1.7 - 7.7 K/uL Final   Lymphocytes Relative 08/29/2021 19  % Final   Lymphs Abs 08/29/2021 2.0  0.7 - 4.0 K/uL Final   Monocytes Relative 08/29/2021 7  % Final   Monocytes Absolute 08/29/2021 0.7  0.1 - 1.0 K/uL Final   Eosinophils Relative 08/29/2021 2  % Final   Eosinophils Absolute 08/29/2021 0.2  0.0 - 0.5 K/uL Final   Basophils Relative  08/29/2021 1  % Final   Basophils Absolute 08/29/2021 0.1  0.0 - 0.1 K/uL Final   Immature Granulocytes 08/29/2021 0  % Final   Abs Immature Granulocytes 08/29/2021 0.03  0.00 - 0.07 K/uL Final   Performed at [facility], [place] [address], [place], [place] [address]   Lithium Lvl 08/29/2021 0.36 (L)  0.60 - 1.20 mmol/L Final   Performed at [place] [address] [place], [place] [address]   pH, Ven 08/29/2021 7.36  7.25 - 7.43 Final   pCO2, Ven 08/29/2021 53  44 - 60 mmHg Final   pO2, Ven 08/29/2021 <31 (LL)  32 - 45 mmHg Final   Comment: CRITICAL RESULT CALLED TO, READ BACK BY AND VERIFIED WITH: A.WOODY, RN AT 1342 ON 05.05.23 BY N.THOMPSON    Bicarbonate 08/29/2021 29.9 (H)  20.0 - 28.0 mmol/L Final   Acid-Base Excess 08/29/2021 3.5 (H)  0.0 - 2.0 mmol/L Final   O2 Saturation 08/29/2021 53.5  % Final   Patient temperature 08/29/2021 37.0   Final   Performed at [facility],  [place] [address], [place], [place] [address]   Alcohol, Ethyl (B) 08/29/2021 <10  <10 mg/dL Final   Comment: (NOTE) Lowest detectable limit for serum alcohol is 10 mg/dL.  For medical purposes only. Performed at [facility], [place] [address] [place], [place] [address]   Admission on 04/15/2021, Discharged on 04/16/2021  Component Date Value Ref Range Status   WBC 04/15/2021 19.1 (H)  4.0 - 10.5 K/uL Final   RBC 04/15/2021 4.30  3.87 - 5.11 MIL/uL Final   Hemoglobin 04/15/2021 11.2 (L)  12.0 - 15.0 g/dL Final   HCT 04/15/2021 35.4 (L)  36.0 - 46.0 % Final   MCV 04/15/2021 82.3  80.0 - 100.0 fL Final   MCH 04/15/2021 26.0  26.0 - 34.0 pg Final   MCHC 04/15/2021 31.6  30.0 - 36.0 g/dL Final   RDW 04/15/2021 15.0  11.5 - 15.5 % Final   Platelets 04/15/2021 318  150 - 400 K/uL Final   nRBC 04/15/2021 0.0  0.0 - 0.2 % Final   Neutrophils Relative % 04/15/2021 65  % Final   Neutro Abs 04/15/2021 12.6 (H)  1.7 - 7.7 K/uL Final   Lymphocytes Relative 04/15/2021 26  % Final   Lymphs Abs 04/15/2021 4.9 (H)  0.7 - 4.0 K/uL Final   Monocytes Relative 04/15/2021 6  % Final   Monocytes Absolute 04/15/2021 1.1 (H)  0.1 - 1.0 K/uL Final   Eosinophils Relative 04/15/2021 2  % Final   Eosinophils Absolute 04/15/2021 0.4  0.0 - 0.5 K/uL Final   Basophils Relative 04/15/2021 1  % Final   Basophils Absolute 04/15/2021 0.1  0.0 - 0.1 K/uL Final   Immature Granulocytes 04/15/2021 0  % Final   Abs Immature Granulocytes 04/15/2021 0.06  0.00 - 0.07 K/uL Final   Performed at [facility], [place] [address], [place], [place] [address]   Sodium 04/15/2021 140  135 - 145 mmol/L Final   Potassium 04/15/2021 4.0  3.5 - 5.1 mmol/L Final   Chloride 04/15/2021 106  98 - 111 mmol/L Final   CO2 04/15/2021 28  22 - 32 mmol/L Final   Glucose, Bld 04/15/2021 200 (H)  70 - 99 mg/dL Final   Glucose reference range applies only to samples taken after fasting for at least 8 hours.   BUN  04/15/2021 8  6 - 20 mg/dL  Final   Creatinine, Ser 04/15/2021 0.79  0.44 - 1.00 mg/dL Final   Calcium 04/15/2021 9.3  8.9 - 10.3 mg/dL Final   Total Protein 04/15/2021 7.9  6.5 - 8.1 g/dL Final   Albumin 04/15/2021 4.3  3.5 - 5.0 g/dL Final   AST 04/15/2021 22  15 - 41 U/L Final   ALT 04/15/2021 17  0 - 44 U/L Final   Alkaline Phosphatase 04/15/2021 63  38 - 126 U/L Final   Total Bilirubin 04/15/2021 0.8  0.3 - 1.2 mg/dL Final   GFR, Estimated 04/15/2021 >60  >60 mL/min Final   Comment: (NOTE) Calculated using the CKD-EPI Creatinine Equation (2021)    Anion gap 04/15/2021 6  5 - 15 Final   Performed at [facility], [place] [address] [place], [place] [address]   Preg Test, Ur 04/15/2021 NEGATIVE  NEGATIVE Final   Comment:        THE SENSITIVITY OF THIS METHODOLOGY IS >24 mIU/mL    Lipase 04/16/2021 35  11 - 51 U/L Final   Performed at [facility], [place] [address] [place], [place] [address]   Lithium Lvl 04/16/2021 0.65  0.60 - 1.20 mmol/L Final   Performed at [place] [address] [place], [place] [address]   Color, Urine 04/16/2021 STRAW (A)  YELLOW Final   APPearance 04/16/2021 CLEAR  CLEAR Final   Specific Gravity, Urine 04/16/2021 1.014  1.005 - 1.030 Final   pH 04/16/2021 8.0  5.0 - 8.0 Final   Glucose, UA 04/16/2021 150 (A)  NEGATIVE mg/dL Final   Hgb urine dipstick 04/16/2021 NEGATIVE  NEGATIVE Final   Bilirubin Urine 04/16/2021 NEGATIVE  NEGATIVE Final   Ketones, ur 04/16/2021 NEGATIVE  NEGATIVE mg/dL Final   Protein, ur 04/16/2021 NEGATIVE  NEGATIVE mg/dL Final   Nitrite 04/16/2021 NEGATIVE  NEGATIVE Final   Leukocytes,Ua 04/16/2021 NEGATIVE  NEGATIVE Final   RBC / HPF 04/16/2021 0-5  0 - 5 RBC/hpf Final   WBC, UA 04/16/2021 0-5  0 - 5 WBC/hpf Final   Bacteria, UA 04/16/2021 NONE SEEN  NONE SEEN Final   Squamous Epithelial / LPF 04/16/2021 0-5  0 - 5 Final   Performed at [facility],  [place] [address], [place], [place] [address]   Preg Test, Ur 04/16/2021 NEGATIVE  NEGATIVE Final   Comment:        THE SENSITIVITY OF THIS METHODOLOGY IS >20 mIU/mL. Performed at [facility], [place] [address], [place], [place] [address]     Allergies: Wellbutrin [bupropion], Omnipaque [iohexol], Contrast media [iodinated contrast media], Depakote [divalproex sodium], Hydroxyzine, and Penicillin g  PTA Medications: (Not in a hospital admission)   Medical Decision Making  Patient is recommended for inpatient psychiatric treatment for stabilization and safety.  Patient will be admitted to [facility] for continuous assessment while awaiting inpatient psychiatric bed to become available. Lab Orders         Resp Panel by RT-PCR (Flu A&B, Covid) Anterior Nasal Swab         CBC with Differential/Platelet         Comprehensive metabolic panel         Hemoglobin A1c         Ethanol         Lipid panel         TSH         Pregnancy, urine         Lithium level  POCT Urine Drug Screen - (I-Screen)         POC SARS Coronavirus 2 Ag         Pregnancy, urine POC    -Resume home medications  -monitor patient for safety.     Recommendations  Based on my evaluation the patient does not appear to have an emergency medical condition.  Ophelia Shoulder, NP 09/28/21  9:27 PM

## 2021-09-28 NOTE — ED Notes (Signed)
Patient admitted to [facility] endorsing SI,  and auditory hallucinations. Denies HI,visual hallucinations and substance. Patient was cooperative during the admission assessment. Skin assessment complete. Belongings inventoried. Patient oriented to unit and unit rules. Meal and drinks offered to patient. Patient verbally contracts for safety during hospitalization. No acute distress noted. Will continue to monitor for safety.

## 2021-09-28 NOTE — Progress Notes (Signed)
   09/28/21 2040  [facility] Triage Screening (Walk-ins at [facility] only)  What Is the Reason for Your Visit/Call Today? Pt has a diagnosis of schizophrenia and says she called law enforcment for help. She is very loud, tearful, and agitated. She says that people are talking about her, following her, and watching her. She says she constantly argues with people, especially her boyfriend. She states she has been kicked off the bus for her behavior and her mother does not want her to come over. She reports suicidal ideation with plan to overdose on medication and insists that if she leaves she will kill herself. She endorses auditory hallucinations. She denies homicidal ideation. She denies alcohol or other substance use.  How Long Has This Been Causing You Problems? 1 wk - 1 month  Have You Recently Had Any Thoughts About Hurting Yourself? Yes  How long ago did you have thoughts about hurting yourself? Now  Are You Planning to Commit Suicide/Harm Yourself At This time? Yes  Have you Recently Had Thoughts About Hurting Someone [name]? No  Are You Planning To Harm Someone At This Time? No  Are you currently experiencing any auditory, visual or other hallucinations? Yes  Please explain the hallucinations you are currently experiencing: Pt reports hearing voices  Have You Used Any Alcohol or Drugs in the Past 24 Hours? No  Do you have any current medical co-morbidities that require immediate attention? No  Clinician description of patient physical appearance/behavior: Pt is casually dressed and well groomed. She alert and oriented x4. Pt speaks in a clear tone, at loud volume and normal pace, frequently yelling. Motor behavior appears agitated. Eye contact is good and Pt is at times tearful. Pt's mood is depressed and fearful, affect is labile. Thought process is coherent with delusional thought content.  What Do You Feel Would Help You the Most Today? Treatment for Depression or other mood problem;Medication(s)  If  access to [facility] was not available, would you have sought care in the Emergency Department? Yes  Determination of Need Emergent (2 hours)  Options For Referral Inpatient Hospitalization;[facility]

## 2021-09-29 LAB — COMPREHENSIVE METABOLIC PANEL
ALT: 15 U/L (ref 0–44)
AST: 20 U/L (ref 15–41)
Albumin: 3.7 g/dL (ref 3.5–5.0)
Alkaline Phosphatase: 51 U/L (ref 38–126)
Anion gap: 10 (ref 5–15)
BUN: 6 mg/dL (ref 6–20)
CO2: 23 mmol/L (ref 22–32)
Calcium: 9.4 mg/dL (ref 8.9–10.3)
Chloride: 106 mmol/L (ref 98–111)
Creatinine, Ser: 0.86 mg/dL (ref 0.44–1.00)
GFR, Estimated: >60 mL/min (ref >60)
Glucose, Bld: 113 mg/dL — ABNORMAL HIGH (ref 70–99)
Potassium: 3.8 mmol/L (ref 3.5–5.1)
Sodium: 139 mmol/L (ref 135–145)
Total Bilirubin: 0.3 mg/dL (ref 0.3–1.2)
Total Protein: 6.9 g/dL (ref 6.5–8.1)

## 2021-09-29 LAB — RESP PANEL BY RT-PCR (FLU A&B, COVID) ARPGX2
Influenza A by PCR: NEGATIVE
Influenza B by PCR: NEGATIVE
SARS Coronavirus 2 by RT PCR: NEGATIVE

## 2021-09-29 LAB — GLUCOSE, CAPILLARY
Glucose-Capillary: 199 mg/dL — ABNORMAL HIGH (ref 70–99)
Glucose-Capillary: 88 mg/dL (ref 70–99)

## 2021-09-29 LAB — LIPID PANEL
Cholesterol: 159 mg/dL (ref 0–200)
HDL: 51 mg/dL (ref >40)
LDL Cholesterol: 90 mg/dL (ref 0–99)
Total CHOL/HDL Ratio: 3.1 RATIO
Triglycerides: 91 mg/dL (ref <150)
VLDL: 18 mg/dL (ref 0–40)

## 2021-09-29 LAB — TSH: TSH: 2.079 u[IU]/mL (ref 0.350–4.500)

## 2021-09-29 MED ORDER — METOPROLOL TARTRATE 25 MG PO TABS
12.5000 mg | ORAL_TABLET | Freq: Two times a day (BID) | ORAL | Status: DC
  Administered 2021-09-29: 12.5 mg via ORAL
  Filled 2021-09-29: qty 1

## 2021-09-29 MED ORDER — VITAMIN B-12 1000 MCG PO TABS
1000.0000 ug | ORAL_TABLET | Freq: Every day | ORAL | Status: DC
  Administered 2021-09-29: 1000 ug via ORAL
  Filled 2021-09-29: qty 1

## 2021-09-29 MED ORDER — SERTRALINE HCL 50 MG PO TABS: 50.0000 mg | ORAL_TABLET | Freq: Every day | ORAL | Status: DC

## 2021-09-29 MED ORDER — OLANZAPINE 10 MG PO TBDP
10.0000 mg | ORAL_TABLET | Freq: Three times a day (TID) | ORAL | Status: DC | PRN
Start: 2021-09-29 — End: 2021-10-14

## 2021-09-29 MED ORDER — LITHIUM CARBONATE ER 450 MG PO TBCR
450.0000 mg | EXTENDED_RELEASE_TABLET | Freq: Two times a day (BID) | ORAL | Status: DC
  Administered 2021-09-29: 450 mg via ORAL
  Filled 2021-09-29: qty 1

## 2021-09-29 MED ORDER — AMLODIPINE BESYLATE 5 MG PO TABS
5.0000 mg | ORAL_TABLET | Freq: Every day | ORAL | Status: DC
  Administered 2021-09-29: 5 mg via ORAL
  Filled 2021-09-29: qty 1

## 2021-09-29 MED ORDER — PANTOPRAZOLE SODIUM 40 MG PO TBEC
40.0000 mg | DELAYED_RELEASE_TABLET | Freq: Every day | ORAL | Status: DC
  Administered 2021-09-29: 40 mg via ORAL
  Filled 2021-09-29: qty 1

## 2021-09-29 MED ORDER — GUANFACINE HCL ER 1 MG PO TB24: 1.0000 mg | ORAL_TABLET | Freq: Every day | ORAL | Status: DC

## 2021-09-29 MED ORDER — GLIMEPIRIDE 4 MG PO TABS
4.0000 mg | ORAL_TABLET | Freq: Every day | ORAL | Status: DC
Start: 2021-09-30 — End: 2022-01-05

## 2021-09-29 MED ORDER — PRIMIDONE 50 MG PO TABS
100.0000 mg | ORAL_TABLET | Freq: Three times a day (TID) | ORAL | Status: DC
  Administered 2021-09-29: 100 mg via ORAL
  Filled 2021-09-29: qty 2

## 2021-09-29 MED ORDER — OLANZAPINE 10 MG PO TBDP: 10.0000 mg | ORAL_TABLET | Freq: Three times a day (TID) | ORAL | Status: DC | PRN

## 2021-09-29 MED ORDER — INSULIN DETEMIR 100 UNIT/ML ~~LOC~~ SOLN
40.0000 [IU] | Freq: Two times a day (BID) | SUBCUTANEOUS | Status: DC
  Administered 2021-09-29: 40 [IU] via SUBCUTANEOUS

## 2021-09-29 MED ORDER — GLIMEPIRIDE 2 MG PO TABS
4.0000 mg | ORAL_TABLET | Freq: Every day | ORAL | Status: DC
Start: 2021-09-29 — End: 2021-09-29
  Administered 2021-09-29: 4 mg via ORAL
  Filled 2021-09-29: qty 2

## 2021-09-29 MED ORDER — MAGIC MOUTHWASH: 5.0000 mL | Freq: Four times a day (QID) | ORAL | Status: DC | PRN

## 2021-09-29 MED ORDER — NYSTATIN 100000 UNIT/ML MT SUSP
5.0000 mL | Freq: Four times a day (QID) | OROMUCOSAL | 0 refills | Status: DC
Start: 2021-09-29 — End: 2021-10-14

## 2021-09-29 MED ORDER — METOPROLOL TARTRATE 25 MG PO TABS
12.5000 mg | ORAL_TABLET | Freq: Two times a day (BID) | ORAL | Status: DC
Start: 2021-09-29 — End: 2021-10-14

## 2021-09-29 MED ORDER — NYSTATIN 100000 UNIT/ML MT SUSP: 5.0000 mL | Freq: Four times a day (QID) | OROMUCOSAL | Status: DC

## 2021-09-29 MED ORDER — VITAMIN D 25 MCG (1000 UNIT) PO TABS
1000.0000 [IU] | ORAL_TABLET | Freq: Every day | ORAL | Status: DC
  Administered 2021-09-29: 1000 [IU] via ORAL
  Filled 2021-09-29 (×2): qty 1

## 2021-09-29 MED ORDER — SIMVASTATIN 20 MG PO TABS
20.0000 mg | ORAL_TABLET | Freq: Every day | ORAL | Status: DC
  Administered 2021-09-29: 20 mg via ORAL
  Filled 2021-09-29: qty 1

## 2021-09-29 MED ORDER — DIPHENHYDRAMINE HCL 12.5 MG/5ML PO LIQD
5.0000 mL | Freq: Four times a day (QID) | ORAL | Status: DC
Start: 2021-09-29 — End: 2021-09-29
  Filled 2021-09-29: qty 1.25

## 2021-09-29 MED ORDER — LITHIUM CARBONATE ER 450 MG PO TBCR
450.0000 mg | EXTENDED_RELEASE_TABLET | Freq: Two times a day (BID) | ORAL | Status: DC
Start: 2021-09-29 — End: 2021-10-14

## 2021-09-29 MED ORDER — SERTRALINE HCL 50 MG PO TABS
50.0000 mg | ORAL_TABLET | Freq: Every day | ORAL | Status: DC
  Administered 2021-09-29: 50 mg via ORAL
  Filled 2021-09-29: qty 1

## 2021-09-29 NOTE — Discharge Instructions (Addendum)
Transfer to [facility] for IP psychiatric admission.

## 2021-09-29 NOTE — ED Notes (Signed)
Patient discharged.

## 2021-09-29 NOTE — Progress Notes (Signed)
[facility] LCSW Progress Note   09/29/2021    12:48 PM  [name]   [id number]   Type of Contact and Topic:  Psychiatric Bed Placement   Pt accepted to [facility] : [facility]     Patient meets inpatient criteria per [name], NP  The attending provider will be Cecelia Byars, NP  Call report to [phone]  Drema Halon, RN @ [facility] notified.     Pt scheduled  to arrive at [place] ANYTIME.    Mariea Clonts, MSW, LCSW-A  12:50 PM 09/29/2021

## 2021-09-29 NOTE — ED Notes (Signed)
Pt was given cereal this morning
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
## 2021-09-29 NOTE — Progress Notes (Signed)
Patient has been denied by [facility] due to no appropriate beds available. Patient meets [place] inpatient criteria per [name], NP. Patient has been faxed out to the following facilities:   [facility]  [place], [name] [address] [place]  [facility]  [address], [place] [address] [phone] [place]  [address] [place] [address] [phone]  [place]  [address] [place] [address] [phone] [place]  [address] [place] [address] [phone]  [facility]  [address], [place] [address] [phone] [facility]  [place], [place] [address] [phone]  [facility]  [address] [place], [place] [address] [phone]  [facility]  [id number] [place] [address] [phone] [facility]  [address] [place] [address] [phone] [facility]  [address], [place] [address] [phone]  [facility]  [address], [place] [address] [place]  [facility]  [address], [place] [address] [phone] [place], MSW, LCSW-A  10:06 AM 09/29/2021

## 2021-09-29 NOTE — ED Provider Notes (Signed)
FBC/OBS ASAP Discharge Summary  Date and Time: 09/29/2021 5:02 PM  Name: [name]  MRN:  [id number]   Discharge Diagnoses:  Final diagnoses:  Schizoaffective disorder, bipolar type [facility])    Subjective: [name] 31 y.o., female patient presented to [facility] on 09/28/2021 with increased depression and SI. She was admitted to the continuous assessment unit while awaiting IP psychiatric bed availability.   [name], 31 y.o., female patient seen face to face by this provider, consulted with Dr. Serafina Mitchell; and chart reviewed on 09/29/21.  She as a psychiatric history of schizoaffective disorder, SI and suicide attempt. She has multiple IP psychiatric admissions. She has ACT services in place with [facility].   During evaluation [name] is laying in her bed in no acute distress. She is attentive and makes  good eye contact. She is alert, oriented x 4 and cooperative. She endorses increased anxiety and depression with congruent. Her speech is clear, coherent and normal rate and tone.  Objectively there is no evidence she is responding to internal/external stimuli. She denies AVH/HI. She endorses SI with a plan to overdose. She can not contract for safety. States, "my meds ant right".   States if she is discharged she will overdose.   Discussed inpatient psychiatric admission and patient agrees.   Stay Summary:   Patient continues to meet inpatient psychiatric admission criteria. She has remained appropriate and compliant while on the unit. She has been accepted to [facility] for inpatient psychiatric admission. She will be transported via [facility].   Total Time spent with patient: 30 minutes  Past Psychiatric History: see h&p Past Medical History:  Past Medical History:  Diagnosis Date   Anxiety    Bipolar 1 disorder ([place])    Cognitive deficits    Depression    Diabetes mellitus without complication ([place])    Hypertension    Mental disorder     Mental health disorder    Obesity     Past Surgical History:  Procedure Laterality Date   CESAREAN SECTION     CESAREAN SECTION N/A 04/25/2013   Procedure: REPEAT CESAREAN SECTION;  Surgeon: [name], MD;  Location: [place] [facility];  Service: Obstetrics;  Laterality: N/A;   MASS EXCISION N/A 06/03/2012   Procedure: EXCISION MASS;  Surgeon: [name], MD;  Location: [place];  Service: ENT;  Laterality: N/A;  Excision uvula mass   TONSILLECTOMY N/A 06/03/2012   Procedure: TONSILLECTOMY;  Surgeon: [name], MD;  Location: [place];  Service: ENT;  Laterality: N/A;   TONSILLECTOMY     Family History:  Family History  Problem Relation Age of Onset   Hypertension Mother    Diabetes Father    Family Psychiatric History: See h&p Social History:  Social History   Substance and Sexual Activity  Alcohol Use Not Currently     Social History   Substance and Sexual Activity  Drug Use Not Currently   Types: "Crack" cocaine, Other-see comments   Comment: Patient reports hx of smoking Crack    Social History   Socioeconomic History   Marital status: Single    Spouse name: Not on file   Number of children: Not on file   Years of education: Not on file   Highest education level: Not on file  Occupational History   Not on file  Tobacco Use   Smoking status: Every Day    Types: Cigars   Smokeless tobacco: Never  Tobacco comments:    Pt declined  Vaping Use   Vaping Use: Never used  Substance and Sexual Activity   Alcohol use: Not Currently   Drug use: Not Currently    Types: "Crack" cocaine, Other-see comments    Comment: Patient reports hx of smoking Crack   Sexual activity: Not Currently    Birth control/protection: None    Comment: occasionally  Other Topics Concern   Not on file  Social History Narrative   ** Merged History Encounter **       Social Determinants of Health   Financial Resource Strain: Not on file  Food  Insecurity: Not on file  Transportation Needs: Not on file  Physical Activity: Not on file  Stress: Not on file  Social Connections: Not on file   SDOH:  SDOH Screenings   Alcohol Screen: Not on file  Depression (PHQ2-9): Not on file  Financial Resource Strain: Not on file  Food Insecurity: Not on file  Housing: Not on file  Physical Activity: Not on file  Social Connections: Not on file  Stress: Not on file  Tobacco Use: High Risk   Smoking Tobacco Use: Every Day   Smokeless Tobacco Use: Never   Passive Exposure: Not on file  Transportation Needs: Not on file    Tobacco Cessation:  N/A, patient does not currently use tobacco products  Current Medications:  Current Facility-Administered Medications  Medication Dose Route Frequency Provider Last Rate Last Admin   acetaminophen (TYLENOL) tablet 650 mg  650 mg Oral Q6H PRN Ajibola, Ene A, NP       alum & mag hydroxide-simeth (MAALOX/MYLANTA) 200-200-20 MG/5ML suspension 30 mL  30 mL Oral Q4H PRN Ajibola, Ene A, NP       amLODipine (NORVASC) tablet 5 mg  5 mg Oral Daily Ajibola, Ene A, NP   5 mg at 09/29/21 0924   cholecalciferol (VITAMIN D3) tablet 1,000 Units  1,000 Units Oral Daily Ajibola, Ene A, NP   1,000 Units at 09/29/21 0925   glimepiride (AMARYL) tablet 4 mg  4 mg Oral Q breakfast Ajibola, Ene A, NP   4 mg at 09/29/21 6440   guanFACINE (INTUNIV) ER tablet 1 mg  1 mg Oral QHS Ajibola, Ene A, NP       insulin detemir (LEVEMIR) injection 40 Units  40 Units Subcutaneous BID Ajibola, Ene A, NP   40 Units at 09/29/21 0925   lithium carbonate (ESKALITH) CR tablet 450 mg  450 mg Oral BID Ajibola, Ene A, NP   450 mg at 09/29/21 3474   magic mouthwash  5 mL Oral QID PRN Hampton Abbot, MD       magnesium hydroxide (MILK OF MAGNESIA) suspension 30 mL  30 mL Oral Daily PRN Ajibola, Ene A, NP       metoprolol tartrate (LOPRESSOR) tablet 12.5 mg  12.5 mg Oral BID Ajibola, Ene A, NP   12.5 mg at 09/29/21 2595   nystatin (MYCOSTATIN)  100000 UNIT/ML suspension 500,000 Units  5 mL Oral QID Revonda Humphrey, NP       OLANZapine zydis (ZYPREXA) disintegrating tablet 10 mg  10 mg Oral Q8H PRN Ajibola, Ene A, NP       pantoprazole (PROTONIX) EC tablet 40 mg  40 mg Oral Daily Ajibola, Ene A, NP   40 mg at 09/29/21 0924   primidone (MYSOLINE) tablet 100 mg  100 mg Oral TID Ajibola, Ene A, NP   100 mg at 09/29/21 0940  sertraline (ZOLOFT) tablet 50 mg  50 mg Oral Daily Ajibola, Ene A, NP   50 mg at 09/29/21 5102   simvastatin (ZOCOR) tablet 20 mg  20 mg Oral Daily Ajibola, Ene A, NP   20 mg at 09/29/21 5852   traZODone (DESYREL) tablet 50 mg  50 mg Oral QHS PRN Ajibola, Ene A, NP       vitamin B-12 (CYANOCOBALAMIN) tablet 1,000 mcg  1,000 mcg Oral Daily Ajibola, Ene A, NP   1,000 mcg at 09/29/21 7782   Current Outpatient Medications  Medication Sig Dispense Refill   albuterol (VENTOLIN HFA) 108 (90 Base) MCG/ACT inhaler Inhale 1-2 puffs into the lungs every 6 (six) hours as needed for wheezing or shortness of breath. 18 g 0   amLODipine (NORVASC) 5 MG tablet Take 5 mg by mouth daily.     benztropine (COGENTIN) 2 MG tablet Take 2 mg by mouth 2 (two) times daily.     busPIRone (BUSPAR) 10 MG tablet Take 10 mg by mouth 3 (three) times daily.     cholecalciferol (VITAMIN D) 25 MCG (1000 UNIT) tablet Take 1,000 Units by mouth daily.     Clobetasol Propionate 0.05 % shampoo Apply 1 application. topically daily.     divalproex (DEPAKOTE ER) 500 MG 24 hr tablet Take 1,000 mg by mouth 2 (two) times daily.     guanFACINE (TENEX) 1 MG tablet Take 1 mg by mouth at bedtime.     haloperidol (HALDOL) 5 MG tablet Take 10-20 mg by mouth 2 (two) times daily. Take two tablets in the morning and four tablets at bedtime.     haloperidol decanoate (HALDOL DECANOATE) 100 MG/ML injection Inject 150 mg into the muscle every 28 (twenty-eight) days. Inject '150mg'$  (1.97m) every 28 days.     hydrOXYzine (VISTARIL) 50 MG capsule Take 50 mg by mouth at bedtime  as needed for anxiety.     insulin detemir (LEVEMIR FLEXPEN) 100 UNIT/ML FlexPen Inject 40 Units into the skin 2 (two) times daily.     lithium carbonate (ESKALITH) 450 MG CR tablet Take 450 mg by mouth 2 (two) times daily.     magic mouthwash (nystatin, lidocaine, diphenhydrAMINE, alum & mag hydroxide) suspension Swish and spit 5 mLs 4 (four) times daily. 180 mL 0   omeprazole (PRILOSEC) 20 MG capsule Take 20 mg by mouth daily.     prazosin (MINIPRESS) 5 MG capsule Take 5 mg by mouth at bedtime.     primidone (MYSOLINE) 50 MG tablet Take 100 mg by mouth 3 (three) times daily.     PROZAC 40 MG capsule Take 40 mg by mouth daily.     simvastatin (ZOCOR) 20 MG tablet Take 20 mg by mouth at bedtime.     triamcinolone lotion (KENALOG) 0.1 % Apply 1 application. topically 3 (three) times daily. Use for 7 days starting on 09/17/21.     vitamin B-12 (CYANOCOBALAMIN) 1000 MCG tablet Take 1,000 mcg by mouth daily.     [START ON 09/30/2021] glimepiride (AMARYL) 4 MG tablet Take 1 tablet (4 mg total) by mouth daily with breakfast.     lithium carbonate (ESKALITH) 450 MG CR tablet Take 1 tablet (450 mg total) by mouth 2 (two) times daily.     metoprolol tartrate (LOPRESSOR) 25 MG tablet Take 0.5 tablets (12.5 mg total) by mouth 2 (two) times daily.     nystatin (MYCOSTATIN) 100000 UNIT/ML suspension Take 5 mLs (500,000 Units total) by mouth 4 (four) times daily. 60 mL  0   OLANZapine zydis (ZYPREXA) 10 MG disintegrating tablet Take 1 tablet (10 mg total) by mouth every 8 (eight) hours as needed (agitation).     sertraline (ZOLOFT) 50 MG tablet Take 1 tablet (50 mg total) by mouth daily.      PTA Medications: (Not in a hospital admission)      07/11/2020    8:18 AM 05/26/2020    3:56 PM 05/22/2020    4:39 PM  Depression screen PHQ 2/9  Decreased Interest 0 2 2  Down, Depressed, Hopeless '1 2 2  '$ PHQ - 2 Score '1 4 4  '$ Altered sleeping 0 2 3  Tired, decreased energy '1 2 1  '$ Change in appetite 0 2 1  Feeling  bad or failure about yourself  0 2 3  Trouble concentrating '2 2 3  '$ Moving slowly or fidgety/restless 0 2 1  Suicidal thoughts 0 2 3  PHQ-9 Score '4 18 19  '$ Difficult doing work/chores  Very difficult Very difficult    Flowsheet Row ED from 09/28/2021 in [facility] ED from 09/26/2021 in [place] [facility] ED from 09/11/2021 in [facility] High Risk High Risk No Risk       Musculoskeletal  Strength & Muscle Tone: within normal limits Gait & Station: normal Patient leans: N/A  Psychiatric Specialty Exam  Presentation  General Appearance: Casual  Eye Contact:Good  Speech:Clear and Coherent; Normal Rate  Speech Volume:Normal  Handedness:Right   Mood and Affect  Mood:Depressed  Affect:Congruent   Thought Process  Thought Processes:Coherent  Descriptions of Associations:Intact  Orientation:Full (Time, Place and Person)  Thought Content:Logical  Diagnosis of Schizophrenia or Schizoaffective disorder in past: Yes  Duration of Psychotic Symptoms: Greater than six months   Hallucinations:Hallucinations: None  Ideas of Reference:None  Suicidal Thoughts:Suicidal Thoughts: Yes, Passive SI Active Intent and/or Plan: With Intent; With Plan; With Means to [place]  Homicidal Thoughts:Homicidal Thoughts: No   Sensorium  Memory:Immediate Good; Recent Good; Remote Good  Judgment:Poor  Insight:Fair   Executive Functions  Concentration:Good  Attention Span:Good  [place] of Knowledge:Good  Language:Good   Psychomotor Activity  Psychomotor Activity:Psychomotor Activity: Normal   Assets  Assets:Communication Skills; Desire for Improvement; Financial Resources/Insurance; Housing; Physical Health; Resilience; Social Support   Sleep  Sleep:Sleep: Fair Number of Hours of Sleep: 6   Nutritional Assessment (For OBS and FBC admissions only) Has the  patient had a weight loss or gain of 10 pounds or more in the last 3 months?: No Has the patient had a decrease in food intake/or appetite?: No Does the patient have dental problems?: No Does the patient have eating habits or behaviors that may be indicators of an eating disorder including binging or inducing vomiting?: No Has the patient recently lost weight without trying?: 0 Has the patient been eating poorly because of a decreased appetite?: 0 Malnutrition Screening Tool Score: 0    Physical Exam  Physical Exam Vitals and nursing note reviewed.  Constitutional:      General: She is not in acute distress.    Appearance: Normal appearance. She is not ill-appearing.  HENT:     Head: Normocephalic.  Eyes:     General:        Right eye: No discharge.        Left eye: No discharge.     Conjunctiva/sclera: Conjunctivae normal.  Cardiovascular:     Rate and Rhythm: Normal rate.  Pulmonary:     Effort: Pulmonary effort is normal. No respiratory distress.  Musculoskeletal:        General: Normal range of motion.     Cervical back: Normal range of motion.  Skin:    Coloration: Skin is not jaundiced or pale.  Neurological:     Mental Status: She is alert and oriented to person, place, and time.  Psychiatric:        Attention and Perception: Attention normal.        Mood and Affect: Affect normal. Mood is anxious and depressed.        Speech: Speech normal.        Behavior: Behavior is cooperative.        Thought Content: Thought content includes suicidal ideation. Thought content includes suicidal plan.        Cognition and Memory: Cognition normal.        Judgment: Judgment is impulsive.   Review of Systems  Constitutional: Negative.   HENT: Negative.    Eyes: Negative.   Respiratory: Negative.    Cardiovascular: Negative.   Musculoskeletal: Negative.   Skin: Negative.   Neurological: Negative.   Psychiatric/Behavioral:  Positive for depression and suicidal ideas. The  patient is nervous/anxious.   Blood pressure (!) 147/90, pulse 90, temperature 98.8 F (37.1 C), resp. rate 18, last menstrual period 09/06/2021, SpO2 100 %. There is no height or weight on file to calculate BMI.  Demographic Factors:  Low socioeconomic status and Unemployed  Loss Factors: Financial problems/change in socioeconomic status  Historical Factors: Prior suicide attempts and Impulsivity  Risk Reduction Factors:   Positive coping skills or problem solving skills  Continued Clinical Symptoms:  Severe Anxiety and/or Agitation Depression:   Hopelessness Impulsivity Schizophrenia:   Paranoid or undifferentiated type More than one psychiatric diagnosis Previous Psychiatric Diagnoses and Treatments  Cognitive Features That Contribute To Risk:  None    Suicide Risk:  Severe:  Frequent, intense, and enduring suicidal ideation, specific plan, no subjective intent, but some objective markers of intent (i.e., choice of lethal method), the method is accessible, some limited preparatory behavior, evidence of impaired self-control, severe dysphoria/symptomatology, multiple risk factors present, and few if any protective factors, particularly a lack of social support.  Plan Of Care/Follow-up recommendations:  Activity:  as tolerated  Diet:  regular   Disposition:   Patient continues to meet inpatient psychiatric admission criteria.  She has been accepted to [facility] for inpatient psychiatric admission. She will be transported via [facility].   Revonda Humphrey, NP 09/29/2021, 5:02 PM

## 2021-09-29 NOTE — ED Notes (Signed)
Pt resting quietly at this time.  Breathing even and unlabored in view of nursing station.  Staff will continue to monitor for safety.

## 2021-09-29 NOTE — ED Notes (Signed)
Pt resting quietly without distress.   Report called to Rubin Payor at [facility] verbalized understanding.  Safe transport notified.

## 2021-09-29 NOTE — ED Notes (Signed)
Patient complains of being shaky and  tremulous. Blood sugar checked at 11:36 am and results 88. Patient given sandwich and juice to eat. Respirations equal and unlabored, skin warm and dry. No acute distress noted at this time. Will continue to monitor patient.

## 2021-09-29 NOTE — ED Notes (Signed)
Pt resting quietly breathing even and unlabored   staff will cont to monitor for safety

## 2021-10-11 ENCOUNTER — Ambulatory Visit (HOSPITAL_COMMUNITY)
Admission: EM | Admit: 2021-10-11 | Discharge: 2021-10-12 | Disposition: A | Discharge status: Home / Self Care | Payer: Medicaid Other | Attending: Nurse Practitioner | Admitting: Nurse Practitioner

## 2021-10-11 ENCOUNTER — Other Ambulatory Visit: Payer: Self-pay

## 2021-10-11 DIAGNOSIS — R45851 Suicidal ideations: Secondary | ICD-10-CM

## 2021-10-11 DIAGNOSIS — Z20822 Contact with and (suspected) exposure to covid-19: Secondary | ICD-10-CM | POA: Insufficient documentation

## 2021-10-11 LAB — POCT URINE DRUG SCREEN - MANUAL ENTRY (I-SCREEN)
POC Amphetamine UR: NOT DETECTED
POC Buprenorphine (BUP): NOT DETECTED
POC Cocaine UR: NOT DETECTED
POC Marijuana UR: NOT DETECTED
POC Methadone UR: NOT DETECTED
POC Methamphetamine UR: NOT DETECTED
POC Morphine: NOT DETECTED
POC Oxazepam (BZO): NOT DETECTED
POC Oxycodone UR: NOT DETECTED
POC Secobarbital (BAR): POSITIVE — AB

## 2021-10-11 LAB — GLUCOSE, CAPILLARY: Glucose-Capillary: 123 mg/dL — ABNORMAL HIGH (ref 70–99)

## 2021-10-11 LAB — POC SARS CORONAVIRUS 2 AG: SARSCOV2ONAVIRUS 2 AG: NEGATIVE

## 2021-10-11 LAB — RESP PANEL BY RT-PCR (FLU A&B, COVID) ARPGX2
Influenza A by PCR: NEGATIVE
Influenza B by PCR: NEGATIVE
SARS Coronavirus 2 by RT PCR: NEGATIVE

## 2021-10-11 LAB — POCT PREGNANCY, URINE: Preg Test, Ur: NEGATIVE

## 2021-10-11 MED ORDER — OLANZAPINE 10 MG PO TBDP
10.0000 mg | ORAL_TABLET | Freq: Three times a day (TID) | ORAL | Status: DC | PRN
Start: 2021-10-11 — End: 2021-10-12

## 2021-10-11 MED ORDER — SIMVASTATIN 20 MG PO TABS
20.0000 mg | ORAL_TABLET | Freq: Every day | ORAL | Status: DC
  Administered 2021-10-11: 20 mg via ORAL
  Filled 2021-10-11: qty 1

## 2021-10-11 MED ORDER — ACETAMINOPHEN 325 MG PO TABS: 650.0000 mg | ORAL_TABLET | Freq: Four times a day (QID) | ORAL | Status: DC | PRN

## 2021-10-11 MED ORDER — BENZTROPINE MESYLATE 1 MG PO TABS
2.0000 mg | ORAL_TABLET | Freq: Two times a day (BID) | ORAL | Status: DC
Start: 2021-10-11 — End: 2021-10-12
  Administered 2021-10-11 – 2021-10-12 (×2): 2 mg via ORAL
  Filled 2021-10-11 (×2): qty 2

## 2021-10-11 MED ORDER — METOPROLOL TARTRATE 25 MG PO TABS
12.5000 mg | ORAL_TABLET | Freq: Two times a day (BID) | ORAL | Status: DC
Start: 2021-10-11 — End: 2021-10-12
  Administered 2021-10-11 – 2021-10-12 (×2): 12.5 mg via ORAL
  Filled 2021-10-11 (×2): qty 1

## 2021-10-11 MED ORDER — INSULIN DETEMIR 100 UNIT/ML FLEXPEN: 40.0000 [IU] | PEN_INJECTOR | Freq: Two times a day (BID) | SUBCUTANEOUS | Status: DC

## 2021-10-11 MED ORDER — PRIMIDONE 50 MG PO TABS
100.0000 mg | ORAL_TABLET | Freq: Three times a day (TID) | ORAL | Status: DC
Start: 2021-10-11 — End: 2021-10-12
  Administered 2021-10-12: 100 mg via ORAL
  Filled 2021-10-11 (×3): qty 2

## 2021-10-11 MED ORDER — SERTRALINE HCL 50 MG PO TABS
50.0000 mg | ORAL_TABLET | Freq: Every day | ORAL | Status: DC
  Administered 2021-10-12: 50 mg via ORAL
  Filled 2021-10-11: qty 1

## 2021-10-11 MED ORDER — BUSPIRONE HCL 5 MG PO TABS
10.0000 mg | ORAL_TABLET | Freq: Three times a day (TID) | ORAL | Status: DC
Start: 2021-10-11 — End: 2021-10-12
  Filled 2021-10-11 (×2): qty 2

## 2021-10-11 MED ORDER — GUANFACINE HCL 1 MG PO TABS
1.0000 mg | ORAL_TABLET | Freq: Every day | ORAL | Status: DC
  Administered 2021-10-11: 1 mg via ORAL
  Filled 2021-10-11: qty 1

## 2021-10-11 MED ORDER — AMLODIPINE BESYLATE 5 MG PO TABS
5.0000 mg | ORAL_TABLET | Freq: Every day | ORAL | Status: DC
  Administered 2021-10-12: 5 mg via ORAL
  Filled 2021-10-11: qty 1

## 2021-10-11 MED ORDER — ALBUTEROL SULFATE HFA 108 (90 BASE) MCG/ACT IN AERS: 1.0000 | INHALATION_SPRAY | Freq: Four times a day (QID) | RESPIRATORY_TRACT | Status: DC | PRN

## 2021-10-11 MED ORDER — PANTOPRAZOLE SODIUM 40 MG PO TBEC
40.0000 mg | DELAYED_RELEASE_TABLET | Freq: Every day | ORAL | Status: DC
  Administered 2021-10-12: 40 mg via ORAL
  Filled 2021-10-11: qty 1

## 2021-10-11 MED ORDER — ALUM & MAG HYDROXIDE-SIMETH 200-200-20 MG/5ML PO SUSP: 30.0000 mL | ORAL | Status: DC | PRN

## 2021-10-11 MED ORDER — VITAMIN B-12 1000 MCG PO TABS
1000.0000 ug | ORAL_TABLET | Freq: Every day | ORAL | Status: DC
  Administered 2021-10-12: 1000 ug via ORAL
  Filled 2021-10-11: qty 1

## 2021-10-11 MED ORDER — MAGNESIUM HYDROXIDE 400 MG/5ML PO SUSP: 30.0000 mL | Freq: Every day | ORAL | Status: DC | PRN

## 2021-10-11 MED ORDER — VITAMIN D3 25 MCG (1000 UNIT) PO TABS
1000.0000 [IU] | ORAL_TABLET | Freq: Every day | ORAL | Status: DC
  Administered 2021-10-12: 1000 [IU] via ORAL
  Filled 2021-10-11 (×2): qty 1

## 2021-10-11 MED ORDER — LITHIUM CARBONATE ER 450 MG PO TBCR
450.0000 mg | EXTENDED_RELEASE_TABLET | Freq: Two times a day (BID) | ORAL | Status: DC
Start: 2021-10-11 — End: 2021-10-12
  Administered 2021-10-11 – 2021-10-12 (×2): 450 mg via ORAL
  Filled 2021-10-11 (×2): qty 1

## 2021-10-11 MED ORDER — PRAZOSIN HCL 1 MG PO CAPS
5.0000 mg | ORAL_CAPSULE | Freq: Every day | ORAL | Status: DC
Start: 2021-10-11 — End: 2021-10-12
  Administered 2021-10-11: 5 mg via ORAL
  Filled 2021-10-11: qty 1

## 2021-10-11 MED ORDER — GLIMEPIRIDE 2 MG PO TABS
4.0000 mg | ORAL_TABLET | Freq: Every day | ORAL | Status: DC
Start: 2021-10-12 — End: 2021-10-12
  Administered 2021-10-12: 4 mg via ORAL
  Filled 2021-10-11: qty 2

## 2021-10-11 MED ORDER — INSULIN DETEMIR 100 UNIT/ML ~~LOC~~ SOLN
40.0000 [IU] | Freq: Two times a day (BID) | SUBCUTANEOUS | Status: DC
  Administered 2021-10-11 – 2021-10-12 (×2): 40 [IU] via SUBCUTANEOUS

## 2021-10-11 NOTE — ED Notes (Signed)
Pt stuck x 2 by RN, attempt to obtain blood work unsuccessful.  Pt tolerating fluids and will retry in am.  NP Shalon Bobbitt notified.

## 2021-10-11 NOTE — ED Notes (Signed)
Pt A&O x 4, presents with suicidal ideations, plan to overdose on medications.  Pt reports people are talking about her, following her and watching her.  Auditory hallucinations, visual hallucinations noted. Monitoring for safety.

## 2021-10-11 NOTE — Progress Notes (Signed)
   10/11/21 1920  [place] Triage Screening (Walk-ins at [facility] only)  What Is the Reason for Your Visit/Call Today? Pt has a diagnosis of schizophrenia and says she called law enforcment for help. She says she was discharged from [facility] 3-4 days ago and does not believe her medications are working. She says that people are talking about her, following her, and watching her. She says she constantly argues with people, especially her boyfriend. She states her mother does not want her to come over, that Pt has a demon inside her. She reports suicidal ideation with plan to overdose on medication and insists that if she leaves she will kill herself. She endorses auditory hallucinations. She denies homicidal ideation. She denies alcohol or other substance use. She says she has spoken to [name] but they have not helped.  How Long Has This Been Causing You Problems? 1-6 months  Have You Recently Had Any Thoughts About Hurting Yourself? Yes  How long ago did you have thoughts about hurting yourself? Today  Are You Planning to Commit Suicide/Harm Yourself At This time? Yes  Have you Recently Had Thoughts About Hurting Someone [name]? No  Are You Planning To Harm Someone At This Time? No  Are you currently experiencing any auditory, visual or other hallucinations? Yes  Please explain the hallucinations you are currently experiencing: Pt reports hearing voices of people talking about her.  Have You Used Any Alcohol or Drugs in the Past 24 Hours? No  Do you have any current medical co-morbidities that require immediate attention? No  Clinician description of patient physical appearance/behavior: Pt is casually dressed and well groomed. She alert and oriented x4. Pt speaks in a clear tone, at loud volume and normal pace. Motor behavior appears agitated. Eye contact is good and Pt is at times tearful. Pt's mood is depressed and fearful, affect is labile. Thought process is coherent with delusional thought  content.  What Do You Feel Would Help You the Most Today? Treatment for Depression or other mood problem;Medication(s)  If access to [facility] was not available, would you have sought care in the Emergency Department? Yes  Determination of Need Urgent (48 hours)  Options For Referral Inpatient Hospitalization;Medication Management;Outpatient Therapy

## 2021-10-11 NOTE — ED Provider Notes (Signed)
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 04/15/2021 0.79  0.44 - 1.00 mg/dL Final   Calcium 04/15/2021 9.3  8.9 - 10.3 mg/dL Final   Total Protein 04/15/2021 7.9  6.5 - 8.1 g/dL Final   Albumin 04/15/2021 4.3  3.5 - 5.0 g/dL Final   AST  04/15/2021 22  15 - 41 U/L Final   ALT 04/15/2021 17  0 - 44 U/L Final   Alkaline Phosphatase 04/15/2021 63  38 - 126 U/L Final   Total Bilirubin 04/15/2021 0.8  0.3 - 1.2 mg/dL Final   GFR, Estimated 04/15/2021 >60  >60 mL/min Final   Comment: (NOTE) Calculated using the CKD-EPI Creatinine Equation (2021)    Anion gap 04/15/2021 6  5 - 15 Final   Performed at [facility], [place] [address], [place], [place] [address]   Preg Test, Ur 04/15/2021 NEGATIVE  NEGATIVE Final   Comment:        THE SENSITIVITY OF THIS METHODOLOGY IS >24 mIU/mL    Lipase 04/16/2021 35  11 - 51 U/L Final   Performed at [facility], [place] [address] [place], [place] [address]   Lithium Lvl 04/16/2021 0.65  0.60 - 1.20 mmol/L Final   Performed at [place] [address], [place], [place] [address]   Color, Urine 04/16/2021 STRAW (A)  YELLOW Final   APPearance 04/16/2021 CLEAR  CLEAR Final   Specific Gravity, Urine 04/16/2021 1.014  1.005 - 1.030 Final   pH 04/16/2021 8.0  5.0 - 8.0 Final   Glucose, UA 04/16/2021 150 (A)  NEGATIVE mg/dL Final   Hgb urine dipstick 04/16/2021 NEGATIVE  NEGATIVE Final   Bilirubin Urine 04/16/2021 NEGATIVE  NEGATIVE Final   Ketones, ur 04/16/2021 NEGATIVE  NEGATIVE mg/dL Final   Protein, ur 04/16/2021 NEGATIVE  NEGATIVE mg/dL Final   Nitrite 04/16/2021 NEGATIVE  NEGATIVE Final   Leukocytes,Ua 04/16/2021 NEGATIVE  NEGATIVE Final   RBC / HPF 04/16/2021 0-5  0 - 5 RBC/hpf Final   WBC, UA 04/16/2021 0-5  0 - 5 WBC/hpf Final   Bacteria, UA 04/16/2021 NONE SEEN  NONE SEEN Final   Squamous Epithelial / LPF 04/16/2021 0-5  0 - 5 Final   Performed at [facility], [place] [address] [place], [place] [address]   Preg Test, Ur 04/16/2021 NEGATIVE  NEGATIVE Final   Comment:        THE SENSITIVITY OF THIS METHODOLOGY IS >20 mIU/mL. Performed at [facility], [place] [address] [place], [place]  [address]     Allergies: Wellbutrin [bupropion], Omnipaque [iohexol], Contrast media [iodinated contrast media], Depakote [divalproex sodium], Hydroxyzine, and Penicillin g  PTA Medications: (Not in a hospital admission)   Medical Decision Making  Nupur A. Dilmore is a 31 y/o female with a history of Schizophrenia brought to [facility] by [employer] voluntarily presenting with suicidal ideations with a plan to overdose on her medications.     Recommendations  Based on my evaluation the patient does not appear to have an emergency  medical condition. Patient will be admitted to [facility] continuous assessment for safety, crisis management and stabilization.  Lucia Bitter, NP 10/11/21  8:30 PM
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
## 2021-10-11 NOTE — ED Notes (Signed)
Pt sleeping at present, no distress noted. Respirations even & unlabored.  Monitoring for safety. 

## 2021-10-12 LAB — COMPREHENSIVE METABOLIC PANEL
ALT: 25 U/L (ref 0–44)
AST: 19 U/L (ref 15–41)
Albumin: 3.5 g/dL (ref 3.5–5.0)
Alkaline Phosphatase: 48 U/L (ref 38–126)
Anion gap: 9 (ref 5–15)
BUN: 14 mg/dL (ref 6–20)
CO2: 27 mmol/L (ref 22–32)
Calcium: 9.2 mg/dL (ref 8.9–10.3)
Chloride: 105 mmol/L (ref 98–111)
Creatinine, Ser: 0.88 mg/dL (ref 0.44–1.00)
GFR, Estimated: >60 mL/min (ref >60)
Glucose, Bld: 156 mg/dL — ABNORMAL HIGH (ref 70–99)
Potassium: 3.6 mmol/L (ref 3.5–5.1)
Sodium: 141 mmol/L (ref 135–145)
Total Bilirubin: 0.4 mg/dL (ref 0.3–1.2)
Total Protein: 6.6 g/dL (ref 6.5–8.1)

## 2021-10-12 LAB — GLUCOSE, CAPILLARY: Glucose-Capillary: 229 mg/dL — ABNORMAL HIGH (ref 70–99)

## 2021-10-12 LAB — CBC WITH DIFFERENTIAL/PLATELET
Abs Immature Granulocytes: 0.04 10*3/uL (ref 0.00–0.07)
Basophils Absolute: 0.1 10*3/uL (ref 0.0–0.1)
Basophils Relative: 1 %
Eosinophils Absolute: 0.4 10*3/uL (ref 0.0–0.5)
Eosinophils Relative: 5 %
HCT: 34.3 % — ABNORMAL LOW (ref 36.0–46.0)
Hemoglobin: 10.8 g/dL — ABNORMAL LOW (ref 12.0–15.0)
Immature Granulocytes: 0 %
Lymphocytes Relative: 34 %
Lymphs Abs: 3.3 10*3/uL (ref 0.7–4.0)
MCH: 25.3 pg — ABNORMAL LOW (ref 26.0–34.0)
MCHC: 31.5 g/dL (ref 30.0–36.0)
MCV: 80.3 fL (ref 80.0–100.0)
Monocytes Absolute: 0.5 10*3/uL (ref 0.1–1.0)
Monocytes Relative: 6 %
Neutro Abs: 5.3 10*3/uL (ref 1.7–7.7)
Neutrophils Relative %: 54 %
Platelets: 233 10*3/uL (ref 150–400)
RBC: 4.27 MIL/uL (ref 3.87–5.11)
RDW: 14.8 % (ref 11.5–15.5)
WBC: 9.6 10*3/uL (ref 4.0–10.5)
nRBC: 0 % (ref 0.0–0.2)

## 2021-10-12 LAB — LITHIUM LEVEL: Lithium Lvl: 0.75 mmol/L (ref 0.60–1.20)

## 2021-10-12 NOTE — Progress Notes (Signed)
Pt presents with pleasant mood, affect congruent. Upon approach patient states I'm not having any suicidal thoughts , I think I can go home. I slept good. '' Patient denies any SI or HI or acute signs of decompensation. She has been compliant with medications and ate breakfast. Order received for pt discharge. AVS printed. Pt also provided with cab voucher for pt to discharge home. Pt belongings returned and escorted from unit to lobby.

## 2021-10-12 NOTE — ED Provider Notes (Signed)
FBC/OBS ASAP Discharge Summary  Date and Time: 10/12/2021 12:17 PM  Name: [name]  MRN:  [id number]   Discharge Diagnoses:  Final diagnoses:  Suicidal ideation   Subjective:   Pt reassessed face to face by NP today.  Pt reports euthymic mood. States she has a good appetite. States she slept well last night, 8 hours. She verbalizes readiness for discharge. She informs me she was recently discharged from [facility]. States she "just needed some time for the meds to kick in". She denies SI/VI/HI. She denies AVH, paranoia. She states she is connected w/ ACT team, who visited her yesterday and will be visiting again today. She states she would like to be home in time to see them. She informs me if discharged home, first thing she plans on doing is cooking [place] wings. She gives verbal consent to speak w/ her mother and ACT team.  Attempted collateral w/ Maurice Small, [phone], without success.  Collateral w/ Rhodell, [phone], ACT Team at [facility]. Per Court, pt was recently discharged from [facility]. Discussed that pt will be discharged today from this facility. Court states he will be visiting pt today. Court states pt is rx'd medications in weekly amounts.   Discussed w/ pt plan for discharge. Pt verbally contracts for safety. Safety planning completed, and pt agrees to continue outpatient services w/ her ACT team, including seeing her ACT team today upon discharge. She agrees should there be any worsening of condition, she will call 911/EMS or go to the nearest emergency room.   Pt is a&ox3, in no acute distress, non-toxic appearing. She appears appropriate for environment, casually dressed. Eye contact is good. Speech is clear and coherent, w/ nml rate and volume. Reported mood is euthymic. Affect is congruent. TP is coherent, goal directed, linear. Description of associations is intact. TC is logical. There is no evidence of agitation,  aggression, distractibility. Pt is calm, cooperative, pleasant.  Stay Summary:  Pt is a 31 y/o female w/ hx of schizophrenia presenting to [facility] on 10/11/21 w/ SI w/ plan to OD on medications. On reassessment today, pt denies SI/VI/HI, AVH, paranoia. She verbally contracts to safety. Her ACT team was contacted and they will be visiting her today upon discharge.   Total Time spent with patient: 30 minutes  Past Psychiatric History:  Past Medical History:  Past Medical History:  Diagnosis Date   Anxiety    Bipolar 1 disorder ([place])    Cognitive deficits    Depression    Diabetes mellitus without complication ([place])    Hypertension    Mental disorder    Mental health disorder    Obesity     Past Surgical History:  Procedure Laterality Date   CESAREAN SECTION     CESAREAN SECTION N/A 04/25/2013   Procedure: REPEAT CESAREAN SECTION;  Surgeon: [name], MD;  Location: [facility];  Service: Obstetrics;  Laterality: N/A;   MASS EXCISION N/A 06/03/2012   Procedure: EXCISION MASS;  Surgeon: [name], MD;  Location: [place];  Service: ENT;  Laterality: N/A;  Excision uvula mass   TONSILLECTOMY N/A 06/03/2012   Procedure: TONSILLECTOMY;  Surgeon: [name], MD;  Location: [place];  Service: ENT;  Laterality: N/A;   TONSILLECTOMY     Family History:  Family History  Problem Relation Age of Onset   Hypertension Mother    Diabetes Father    Family Psychiatric History: Unknown to pt Social History:  Social History  Substance and Sexual Activity  Alcohol Use Not Currently     Social History   Substance and Sexual Activity  Drug Use Not Currently   Types: "Crack" cocaine, Other-see comments   Comment: Patient reports hx of smoking Crack    Social History   Socioeconomic History   Marital status: Single    Spouse name: Not on file   Number of children: Not on file   Years of education: Not on file   Highest education level: Not on  file  Occupational History   Not on file  Tobacco Use   Smoking status: Every Day    Types: Cigars   Smokeless tobacco: Never   Tobacco comments:    Pt declined  Vaping Use   Vaping Use: Never used  Substance and Sexual Activity   Alcohol use: Not Currently   Drug use: Not Currently    Types: "Crack" cocaine, Other-see comments    Comment: Patient reports hx of smoking Crack   Sexual activity: Not Currently    Birth control/protection: None    Comment: occasionally  Other Topics Concern   Not on file  Social History Narrative   ** Merged History Encounter **       Social Determinants of Health   Financial Resource Strain: Not on file  Food Insecurity: Not on file  Transportation Needs: Not on file  Physical Activity: Not on file  Stress: Not on file  Social Connections: Not on file   SDOH:  SDOH Screenings   Alcohol Screen: Low Risk  (05/02/2020)   Alcohol Screen    Last Alcohol Screening Score (AUDIT): 3  Depression (PHQ2-9): Low Risk  (07/11/2020)   Depression (PHQ2-9)    PHQ-2 Score: 4  Recent Concern: Depression (PHQ2-9) - Medium Risk (05/26/2020)   Depression (PHQ2-9)    PHQ-2 Score: 18  Financial Resource Strain: Not on file  Food Insecurity: Not on file  Housing: Not on file  Physical Activity: Not on file  Social Connections: Not on file  Stress: Not on file  Tobacco Use: High Risk (09/26/2021)   Patient History    Smoking Tobacco Use: Every Day    Smokeless Tobacco Use: Never    Passive Exposure: Not on file  Transportation Needs: Not on file    Tobacco Cessation:  N/A, patient does not currently use tobacco products  Current Medications:  Current Facility-Administered Medications  Medication Dose Route Frequency Provider Last Rate Last Admin   acetaminophen (TYLENOL) tablet 650 mg  650 mg Oral Q6H PRN Bobbitt, Shalon E, NP       albuterol (VENTOLIN HFA) 108 (90 Base) MCG/ACT inhaler 1-2 puff  1-2 puff Inhalation Q6H PRN Bobbitt, Shalon E, NP        alum & mag hydroxide-simeth (MAALOX/MYLANTA) 200-200-20 MG/5ML suspension 30 mL  30 mL Oral Q4H PRN Bobbitt, Shalon E, NP       amLODipine (NORVASC) tablet 5 mg  5 mg Oral Daily Bobbitt, Shalon E, NP   5 mg at 10/12/21 0849   benztropine (COGENTIN) tablet 2 mg  2 mg Oral BID Bobbitt, Shalon E, NP   2 mg at 10/12/21 0849   busPIRone (BUSPAR) tablet 10 mg  10 mg Oral TID Bobbitt, Shalon E, NP       cholecalciferol (VITAMIN D) tablet 1,000 Units  1,000 Units Oral Daily Bobbitt, Shalon E, NP   1,000 Units at 10/12/21 0850   glimepiride (AMARYL) tablet 4 mg  4 mg Oral Q breakfast Bobbitt, Shalon  E, NP   4 mg at 10/12/21 0848   guanFACINE (TENEX) tablet 1 mg  1 mg Oral QHS Bobbitt, Shalon E, NP   1 mg at 10/11/21 2210   insulin detemir (LEVEMIR) injection 40 Units  40 Units Subcutaneous BID Bobbitt, Shalon E, NP   40 Units at 10/12/21 0850   lithium carbonate (ESKALITH) CR tablet 450 mg  450 mg Oral BID Bobbitt, Shalon E, NP   450 mg at 10/12/21 0850   magnesium hydroxide (MILK OF MAGNESIA) suspension 30 mL  30 mL Oral Daily PRN Bobbitt, Shalon E, NP       metoprolol tartrate (LOPRESSOR) tablet 12.5 mg  12.5 mg Oral BID Bobbitt, Shalon E, NP   12.5 mg at 10/12/21 0849   OLANZapine zydis (ZYPREXA) disintegrating tablet 10 mg  10 mg Oral Q8H PRN Bobbitt, Shalon E, NP       pantoprazole (PROTONIX) EC tablet 40 mg  40 mg Oral Daily Bobbitt, Shalon E, NP   40 mg at 10/12/21 0849   prazosin (MINIPRESS) capsule 5 mg  5 mg Oral QHS Bobbitt, Shalon E, NP   5 mg at 10/11/21 2209   primidone (MYSOLINE) tablet 100 mg  100 mg Oral TID Bobbitt, Shalon E, NP   100 mg at 10/12/21 0051   sertraline (ZOLOFT) tablet 50 mg  50 mg Oral Daily Bobbitt, Shalon E, NP   50 mg at 10/12/21 0849   simvastatin (ZOCOR) tablet 20 mg  20 mg Oral QHS Bobbitt, Shalon E, NP   20 mg at 10/11/21 2210   vitamin B-12 (CYANOCOBALAMIN) tablet 1,000 mcg  1,000 mcg Oral Daily Bobbitt, Shalon E, NP   1,000 mcg at 10/12/21 1694   Current  Outpatient Medications  Medication Sig Dispense Refill   albuterol (VENTOLIN HFA) 108 (90 Base) MCG/ACT inhaler Inhale 1-2 puffs into the lungs every 6 (six) hours as needed for wheezing or shortness of breath. 18 g 0   amLODipine (NORVASC) 5 MG tablet Take 5 mg by mouth daily.     benztropine (COGENTIN) 2 MG tablet Take 2 mg by mouth 2 (two) times daily.     busPIRone (BUSPAR) 10 MG tablet Take 10 mg by mouth 3 (three) times daily.     cholecalciferol (VITAMIN D) 25 MCG (1000 UNIT) tablet Take 1,000 Units by mouth daily.     Clobetasol Propionate 0.05 % shampoo Apply 1 application. topically daily.     divalproex (DEPAKOTE ER) 500 MG 24 hr tablet Take 1,000 mg by mouth 2 (two) times daily.     glimepiride (AMARYL) 4 MG tablet Take 1 tablet (4 mg total) by mouth daily with breakfast.     guanFACINE (TENEX) 1 MG tablet Take 1 mg by mouth at bedtime.     haloperidol (HALDOL) 5 MG tablet Take 10-20 mg by mouth 2 (two) times daily. Take two tablets in the morning and four tablets at bedtime.     haloperidol decanoate (HALDOL DECANOATE) 100 MG/ML injection Inject 150 mg into the muscle every 28 (twenty-eight) days. Inject '150mg'$  (1.25m) every 28 days.     hydrOXYzine (VISTARIL) 50 MG capsule Take 50 mg by mouth at bedtime as needed for anxiety.     insulin detemir (LEVEMIR FLEXPEN) 100 UNIT/ML FlexPen Inject 40 Units into the skin 2 (two) times daily.     lithium carbonate (ESKALITH) 450 MG CR tablet Take 450 mg by mouth 2 (two) times daily.     lithium carbonate (ESKALITH) 450 MG CR tablet  Take 1 tablet (450 mg total) by mouth 2 (two) times daily.     magic mouthwash (nystatin, lidocaine, diphenhydrAMINE, alum & mag hydroxide) suspension Swish and spit 5 mLs 4 (four) times daily. 180 mL 0   metoprolol tartrate (LOPRESSOR) 25 MG tablet Take 0.5 tablets (12.5 mg total) by mouth 2 (two) times daily.     nystatin (MYCOSTATIN) 100000 UNIT/ML suspension Take 5 mLs (500,000 Units total) by mouth 4 (four)  times daily. 60 mL 0   OLANZapine zydis (ZYPREXA) 10 MG disintegrating tablet Take 1 tablet (10 mg total) by mouth every 8 (eight) hours as needed (agitation).     omeprazole (PRILOSEC) 20 MG capsule Take 20 mg by mouth daily.     prazosin (MINIPRESS) 5 MG capsule Take 5 mg by mouth at bedtime.     primidone (MYSOLINE) 50 MG tablet Take 100 mg by mouth 3 (three) times daily.     PROZAC 40 MG capsule Take 40 mg by mouth daily.     sertraline (ZOLOFT) 50 MG tablet Take 1 tablet (50 mg total) by mouth daily.     simvastatin (ZOCOR) 20 MG tablet Take 20 mg by mouth at bedtime.     triamcinolone lotion (KENALOG) 0.1 % Apply 1 application. topically 3 (three) times daily. Use for 7 days starting on 09/17/21.     vitamin B-12 (CYANOCOBALAMIN) 1000 MCG tablet Take 1,000 mcg by mouth daily.      PTA Medications: (Not in a hospital admission)      07/11/2020    8:18 AM 05/26/2020    3:56 PM 05/22/2020    4:39 PM  Depression screen PHQ 2/9  Decreased Interest 0 2 2  Down, Depressed, Hopeless '1 2 2  '$ PHQ - 2 Score '1 4 4  '$ Altered sleeping 0 2 3  Tired, decreased energy '1 2 1  '$ Change in appetite 0 2 1  Feeling bad or failure about yourself  0 2 3  Trouble concentrating '2 2 3  '$ Moving slowly or fidgety/restless 0 2 1  Suicidal thoughts 0 2 3  PHQ-9 Score '4 18 19  '$ Difficult doing work/chores  Very difficult Very difficult    Flowsheet Row ED from 10/11/2021 in [facility] ED from 09/28/2021 in [facility] ED from 09/26/2021 in [facility]  C-SSRS RISK CATEGORY High Risk High Risk High Risk       Musculoskeletal  Strength & Muscle Tone: within normal limits Gait & Station: normal Patient leans: N/A  Psychiatric Specialty Exam  Presentation  General Appearance: Appropriate for Environment; Casual  Eye Contact:Good  Speech:Clear and Coherent; Normal Rate  Speech  Volume:Normal  Handedness:Right   Mood and Affect  Mood:Euthymic  Affect:Congruent   Thought Process  Thought Processes:Coherent; Goal Directed; Linear  Descriptions of Associations:Intact  Orientation:Full (Time, Place and Person)  Thought Content:Logical  Diagnosis of Schizophrenia or Schizoaffective disorder in past: Yes  Duration of Psychotic Symptoms: Greater than six months   Hallucinations:Hallucinations: None  Ideas of Reference:None  Suicidal Thoughts:Suicidal Thoughts: No  Homicidal Thoughts:Homicidal Thoughts: No   Sensorium  Memory:Immediate Fair  Judgment:Intact  Insight:Fair   Executive Functions  Concentration:Fair  Attention Span:Fair  [place]  Language:Good   Psychomotor Activity  Psychomotor Activity:Psychomotor Activity: Normal   Assets  Assets:Communication Skills; Desire for Improvement; Housing   Sleep  Sleep:Sleep: Good Number of Hours of Sleep: 8   Nutritional Assessment (For OBS and FBC admissions only)  Has the patient had a weight loss or gain of 10 pounds or more in the last 3 months?: No Has the patient had a decrease in food intake/or appetite?: No Does the patient have dental problems?: No Does the patient have eating habits or behaviors that may be indicators of an eating disorder including binging or inducing vomiting?: No Has the patient recently lost weight without trying?: 0 Has the patient been eating poorly because of a decreased appetite?: 0 Malnutrition Screening Tool Score: 0    Physical Exam  Physical Exam Cardiovascular:     Rate and Rhythm: Normal rate.  Pulmonary:     Effort: Pulmonary effort is normal.  Neurological:     Mental Status: She is alert and oriented to person, place, and time.  Psychiatric:        Attention and Perception: Attention and perception normal.        Mood and Affect: Mood normal.        Speech: Speech normal.        Behavior: Behavior  normal. Behavior is cooperative.        Thought Content: Thought content normal.    Review of Systems  Constitutional:  Negative for chills and fever.  Respiratory:  Negative for shortness of breath.   Cardiovascular:  Negative for chest pain and palpitations.  Psychiatric/Behavioral: Negative.     Blood pressure 115/74, pulse 84, temperature 97.9 F (36.6 C), temperature source Oral, resp. rate 18, SpO2 100 %. There is no height or weight on file to calculate BMI.  Demographic Factors:  Living alone and Unemployed  Loss Factors: NA  Historical Factors: Prior suicide attempts  Risk Reduction Factors:   Positive social support and Positive therapeutic relationship  Continued Clinical Symptoms:  Previous Psychiatric Diagnoses and Treatments  Cognitive Features That Contribute To Risk:  None    Suicide Risk:  Minimal: No identifiable suicidal ideation.  Patients presenting with no risk factors but with morbid ruminations; may be classified as minimal risk based on the severity of the depressive symptoms  Plan Of Care/Follow-up recommendations:  Follow up with ACT team for medication management and counseling  Disposition:  Discharge  Tharon Aquas, NP 10/12/2021, 12:17 PM

## 2021-10-12 NOTE — ED Notes (Signed)
Pt sleeping in bed. Respirations even and unlabored with no signs of acute distress noted. Will continue to monitor for safety.  

## 2021-10-12 NOTE — Discharge Instructions (Addendum)
Please follow up with your ACT team for medication management and counseling. Your ACT team will be visiting you today upon discharge.   Patient is instructed prior to discharge to: Take all medications as prescribed by his/her mental healthcare provider. Report any adverse effects and or reactions from the medicines to his/her outpatient provider promptly. Keep all scheduled appointments, to ensure that you are getting refills on time and to avoid any interruption in your medication.  If you are unable to keep an appointment call to reschedule.  Be sure to follow-up with resources and follow-up appointments provided.  Patient has been instructed & cautioned: To not engage in alcohol and or illegal drug use while on prescription medicines. In the event of worsening symptoms, patient is instructed to call the crisis hotline, 911 and or go to the nearest ED for appropriate evaluation and treatment of symptoms. To follow-up with his/her primary care provider for your other medical issues, concerns and or health care needs.

## 2021-10-13 ENCOUNTER — Emergency Department (HOSPITAL_COMMUNITY)
Admission: EM | Admit: 2021-10-13 | Discharge: 2021-10-14 | Disposition: A | Discharge status: Home / Self Care | Payer: Medicaid Other | Attending: Emergency Medicine | Admitting: Emergency Medicine

## 2021-10-13 ENCOUNTER — Other Ambulatory Visit: Payer: Self-pay

## 2021-10-13 ENCOUNTER — Encounter (HOSPITAL_COMMUNITY): Payer: Self-pay

## 2021-10-13 DIAGNOSIS — F250 Schizoaffective disorder, bipolar type: Secondary | ICD-10-CM | POA: Diagnosis present

## 2021-10-13 DIAGNOSIS — R45851 Suicidal ideations: Secondary | ICD-10-CM | POA: Diagnosis not present

## 2021-10-13 DIAGNOSIS — Z79899 Other long term (current) drug therapy: Secondary | ICD-10-CM | POA: Insufficient documentation

## 2021-10-13 DIAGNOSIS — Z20822 Contact with and (suspected) exposure to covid-19: Secondary | ICD-10-CM | POA: Insufficient documentation

## 2021-10-13 DIAGNOSIS — E119 Type 2 diabetes mellitus without complications: Secondary | ICD-10-CM | POA: Diagnosis not present

## 2021-10-13 DIAGNOSIS — I10 Essential (primary) hypertension: Secondary | ICD-10-CM | POA: Insufficient documentation

## 2021-10-13 DIAGNOSIS — Z794 Long term (current) use of insulin: Secondary | ICD-10-CM | POA: Insufficient documentation

## 2021-10-13 DIAGNOSIS — F99 Mental disorder, not otherwise specified: Secondary | ICD-10-CM | POA: Diagnosis not present

## 2021-10-13 DIAGNOSIS — F430 Acute stress reaction: Secondary | ICD-10-CM | POA: Diagnosis present

## 2021-10-13 DIAGNOSIS — F4325 Adjustment disorder with mixed disturbance of emotions and conduct: Secondary | ICD-10-CM | POA: Diagnosis not present

## 2021-10-13 LAB — CBC
HCT: 34.8 % — ABNORMAL LOW (ref 36.0–46.0)
Hemoglobin: 10.9 g/dL — ABNORMAL LOW (ref 12.0–15.0)
MCH: 25.5 pg — ABNORMAL LOW (ref 26.0–34.0)
MCHC: 31.3 g/dL (ref 30.0–36.0)
MCV: 81.5 fL (ref 80.0–100.0)
Platelets: 304 10*3/uL (ref 150–400)
RBC: 4.27 MIL/uL (ref 3.87–5.11)
RDW: 15 % (ref 11.5–15.5)
WBC: 14.6 10*3/uL — ABNORMAL HIGH (ref 4.0–10.5)
nRBC: 0 % (ref 0.0–0.2)

## 2021-10-13 LAB — SALICYLATE LEVEL: Salicylate Lvl: <7 mg/dL — ABNORMAL LOW (ref 7.0–30.0)

## 2021-10-13 LAB — COMPREHENSIVE METABOLIC PANEL
ALT: 24 U/L (ref 0–44)
AST: 24 U/L (ref 15–41)
Albumin: 4.2 g/dL (ref 3.5–5.0)
Alkaline Phosphatase: 52 U/L (ref 38–126)
Anion gap: 4 — ABNORMAL LOW (ref 5–15)
BUN: 12 mg/dL (ref 6–20)
CO2: 27 mmol/L (ref 22–32)
Calcium: 9.2 mg/dL (ref 8.9–10.3)
Chloride: 110 mmol/L (ref 98–111)
Creatinine, Ser: 0.79 mg/dL (ref 0.44–1.00)
GFR, Estimated: >60 mL/min (ref >60)
Glucose, Bld: 95 mg/dL (ref 70–99)
Potassium: 3.8 mmol/L (ref 3.5–5.1)
Sodium: 141 mmol/L (ref 135–145)
Total Bilirubin: 0.5 mg/dL (ref 0.3–1.2)
Total Protein: 7.9 g/dL (ref 6.5–8.1)

## 2021-10-13 LAB — RESP PANEL BY RT-PCR (FLU A&B, COVID) ARPGX2
Influenza A by PCR: NEGATIVE
Influenza B by PCR: NEGATIVE
SARS Coronavirus 2 by RT PCR: NEGATIVE

## 2021-10-13 LAB — ETHANOL: Alcohol, Ethyl (B): <10 mg/dL (ref <10)

## 2021-10-13 LAB — LITHIUM LEVEL: Lithium Lvl: 0.73 mmol/L (ref 0.60–1.20)

## 2021-10-13 LAB — I-STAT BETA HCG BLOOD, ED (MC, WL, AP ONLY): I-stat hCG, quantitative: <5 m[IU]/mL (ref <5)

## 2021-10-13 LAB — ACETAMINOPHEN LEVEL: Acetaminophen (Tylenol), Serum: <10 ug/mL — ABNORMAL LOW (ref 10–30)

## 2021-10-13 LAB — VALPROIC ACID LEVEL: Valproic Acid Lvl: <20 ug/mL — ABNORMAL LOW (ref 50.0–100.0)

## 2021-10-13 MED ORDER — BUSPIRONE HCL 10 MG PO TABS
10.0000 mg | ORAL_TABLET | Freq: Three times a day (TID) | ORAL | Status: DC
  Administered 2021-10-13 – 2021-10-14 (×2): 10 mg via ORAL
  Filled 2021-10-13 (×2): qty 1

## 2021-10-13 MED ORDER — METOPROLOL TARTRATE 25 MG PO TABS
12.5000 mg | ORAL_TABLET | Freq: Two times a day (BID) | ORAL | Status: DC
  Administered 2021-10-13 – 2021-10-14 (×2): 12.5 mg via ORAL
  Filled 2021-10-13 (×2): qty 1

## 2021-10-13 MED ORDER — PRAZOSIN HCL 1 MG PO CAPS
5.0000 mg | ORAL_CAPSULE | Freq: Every day | ORAL | Status: DC
  Administered 2021-10-13: 5 mg via ORAL
  Filled 2021-10-13: qty 5

## 2021-10-13 MED ORDER — VITAMIN D3 25 MCG (1000 UNIT) PO TABS
1000.0000 [IU] | ORAL_TABLET | Freq: Every day | ORAL | Status: DC
  Administered 2021-10-14: 1000 [IU] via ORAL
  Filled 2021-10-13 (×2): qty 1

## 2021-10-13 MED ORDER — INSULIN DETEMIR 100 UNIT/ML ~~LOC~~ SOLN
40.0000 [IU] | Freq: Two times a day (BID) | SUBCUTANEOUS | Status: DC
  Administered 2021-10-14: 40 [IU] via SUBCUTANEOUS
  Filled 2021-10-13 (×3): qty 0.4

## 2021-10-13 MED ORDER — PRIMIDONE 50 MG PO TABS
100.0000 mg | ORAL_TABLET | Freq: Three times a day (TID) | ORAL | Status: DC
  Administered 2021-10-14: 100 mg via ORAL
  Filled 2021-10-13 (×3): qty 2

## 2021-10-13 MED ORDER — AMLODIPINE BESYLATE 5 MG PO TABS
5.0000 mg | ORAL_TABLET | Freq: Every day | ORAL | Status: DC
Start: 2021-10-14 — End: 2021-10-14
  Administered 2021-10-14: 5 mg via ORAL
  Filled 2021-10-13: qty 1

## 2021-10-13 MED ORDER — OLANZAPINE 10 MG PO TBDP
10.0000 mg | ORAL_TABLET | Freq: Three times a day (TID) | ORAL | Status: DC | PRN
  Administered 2021-10-13: 10 mg via ORAL
  Filled 2021-10-13: qty 1

## 2021-10-13 MED ORDER — AMLODIPINE BESYLATE 5 MG PO TABS
5.0000 mg | ORAL_TABLET | Freq: Every day | ORAL | Status: DC
  Administered 2021-10-13: 5 mg via ORAL
  Filled 2021-10-13: qty 1

## 2021-10-13 MED ORDER — SIMVASTATIN 20 MG PO TABS
20.0000 mg | ORAL_TABLET | Freq: Every day | ORAL | Status: DC
  Administered 2021-10-13: 20 mg via ORAL
  Filled 2021-10-13: qty 1

## 2021-10-13 MED ORDER — GUANFACINE HCL 1 MG PO TABS: 1.0000 mg | ORAL_TABLET | Freq: Every day | ORAL | Status: DC

## 2021-10-13 MED ORDER — SERTRALINE HCL 50 MG PO TABS
50.0000 mg | ORAL_TABLET | Freq: Every day | ORAL | Status: DC
  Administered 2021-10-13 – 2021-10-14 (×2): 50 mg via ORAL
  Filled 2021-10-13 (×2): qty 1

## 2021-10-13 MED ORDER — BENZTROPINE MESYLATE 1 MG PO TABS
2.0000 mg | ORAL_TABLET | Freq: Two times a day (BID) | ORAL | Status: DC
  Administered 2021-10-13 – 2021-10-14 (×2): 2 mg via ORAL
  Filled 2021-10-13 (×2): qty 2

## 2021-10-13 MED ORDER — PRIMIDONE 50 MG PO TABS: 100.0000 mg | ORAL_TABLET | Freq: Three times a day (TID) | ORAL | Status: DC

## 2021-10-13 MED ORDER — VITAMIN B-12 1000 MCG PO TABS: 1000.0000 ug | ORAL_TABLET | Freq: Every day | ORAL | Status: DC

## 2021-10-13 MED ORDER — VITAMIN B-12 1000 MCG PO TABS
1000.0000 ug | ORAL_TABLET | Freq: Every day | ORAL | Status: DC
  Administered 2021-10-14: 1000 ug via ORAL
  Filled 2021-10-13: qty 1

## 2021-10-13 MED ORDER — VITAMIN D3 25 MCG (1000 UNIT) PO TABS: 1000.0000 [IU] | ORAL_TABLET | Freq: Every day | ORAL | Status: DC

## 2021-10-13 MED ORDER — INSULIN DETEMIR 100 UNIT/ML FLEXPEN: 40.0000 [IU] | PEN_INJECTOR | Freq: Two times a day (BID) | SUBCUTANEOUS | Status: DC

## 2021-10-13 MED ORDER — GUANFACINE HCL 1 MG PO TABS
1.0000 mg | ORAL_TABLET | Freq: Every day | ORAL | Status: DC
  Filled 2021-10-13: qty 1

## 2021-10-13 MED ORDER — LITHIUM CARBONATE ER 450 MG PO TBCR
450.0000 mg | EXTENDED_RELEASE_TABLET | Freq: Two times a day (BID) | ORAL | Status: DC
  Administered 2021-10-13 – 2021-10-14 (×2): 450 mg via ORAL
  Filled 2021-10-13 (×2): qty 1

## 2021-10-13 MED ORDER — GLIMEPIRIDE 4 MG PO TABS
4.0000 mg | ORAL_TABLET | Freq: Every day | ORAL | Status: DC
  Administered 2021-10-14: 4 mg via ORAL
  Filled 2021-10-13: qty 1

## 2021-10-13 MED ORDER — PANTOPRAZOLE SODIUM 40 MG PO TBEC
40.0000 mg | DELAYED_RELEASE_TABLET | Freq: Every day | ORAL | Status: DC
  Administered 2021-10-14: 40 mg via ORAL
  Filled 2021-10-13 (×2): qty 1

## 2021-10-13 NOTE — ED Provider Notes (Signed)
[facility] DEPT Provider Note   CSN: [id number] Arrival date & time: 10/13/21  1817     History  Chief Complaint  Patient presents with   Suicidal    [name] is a 31 y.o. female.  HPI Patient presents suicidal.  States plans to overdose with her medication.  Hearing voices.  States that her mother has been telling her that demons are there because of her.  Patient also asked to think maybe it is just the voices telling me that it is my mom saying that.  Denies drug use.  Recently seen at behavioral health urgent care for similar symptoms.  States she is on medicines for it but does not think they will help because there is not enough of them.  Has active team who is seen the patient in the last couple days.   Past Medical History:  Diagnosis Date   Anxiety    Bipolar 1 disorder ([place])    Cognitive deficits    Depression    Diabetes mellitus without complication ([place])    Hypertension    Mental disorder    Mental health disorder    Obesity     Home Medications Prior to Admission medications   Medication Sig Start Date End Date Taking? Authorizing Provider  albuterol (VENTOLIN HFA) 108 (90 Base) MCG/ACT inhaler Inhale 1-2 puffs into the lungs every 6 (six) hours as needed for wheezing or shortness of breath. 01/11/21   Pearson Forster, NP  amLODipine (NORVASC) 5 MG tablet Take 5 mg by mouth daily.    [provider]  benztropine (COGENTIN) 2 MG tablet Take 2 mg by mouth 2 (two) times daily.    [provider]  busPIRone (BUSPAR) 10 MG tablet Take 10 mg by mouth 3 (three) times daily.    [provider]  cholecalciferol (VITAMIN D) 25 MCG (1000 UNIT) tablet Take 1,000 Units by mouth daily. 04/30/20   [provider]  Clobetasol Propionate 0.05 % shampoo Apply 1 application. topically daily.    [provider]  divalproex (DEPAKOTE ER) 500 MG 24 hr tablet Take 1,000 mg by mouth 2 (two) times daily.     [provider]  glimepiride (AMARYL) 4 MG tablet Take 1 tablet (4 mg total) by mouth daily with breakfast. 09/30/21   Revonda Humphrey, NP  guanFACINE (TENEX) 1 MG tablet Take 1 mg by mouth at bedtime.    [provider]  haloperidol (HALDOL) 5 MG tablet Take 10-20 mg by mouth 2 (two) times daily. Take two tablets in the morning and four tablets at bedtime.    [provider]  haloperidol decanoate (HALDOL DECANOATE) 100 MG/ML injection Inject 150 mg into the muscle every 28 (twenty-eight) days. Inject '150mg'$  (1.39m) every 28 days.    [provider]  hydrOXYzine (VISTARIL) 50 MG capsule Take 50 mg by mouth at bedtime as needed for anxiety.    [provider]  insulin detemir (LEVEMIR FLEXPEN) 100 UNIT/ML FlexPen Inject 40 Units into the skin 2 (two) times daily.    [provider]  lithium carbonate (ESKALITH) 450 MG CR tablet Take 450 mg by mouth 2 (two) times daily.    [provider]  lithium carbonate (ESKALITH) 450 MG CR tablet Take 1 tablet (450 mg total) by mouth 2 (two) times daily. 09/29/21   CRevonda Humphrey NP  magic mouthwash (nystatin, lidocaine, diphenhydrAMINE, alum & mag hydroxide) suspension Swish and spit 5 mLs 4 (four)  times daily. 09/11/21   Nyoka Lint, PA-C  metoprolol tartrate (LOPRESSOR) 25 MG tablet Take 0.5 tablets (12.5 mg total) by mouth 2 (two) times daily. 09/29/21   Revonda Humphrey, NP  nystatin (MYCOSTATIN) 100000 UNIT/ML suspension Take 5 mLs (500,000 Units total) by mouth 4 (four) times daily. 09/29/21   Revonda Humphrey, NP  OLANZapine zydis (ZYPREXA) 10 MG disintegrating tablet Take 1 tablet (10 mg total) by mouth every 8 (eight) hours as needed (agitation). 09/29/21   Revonda Humphrey, NP  omeprazole (PRILOSEC) 20 MG capsule Take 20 mg by mouth daily.    [provider]  prazosin (MINIPRESS) 5 MG capsule Take 5 mg by mouth at bedtime.    [provider]  primidone (MYSOLINE) 50 MG  tablet Take 100 mg by mouth 3 (three) times daily.    [provider]  PROZAC 40 MG capsule Take 40 mg by mouth daily. 05/12/21   [provider]  sertraline (ZOLOFT) 50 MG tablet Take 1 tablet (50 mg total) by mouth daily. 09/29/21   Revonda Humphrey, NP  simvastatin (ZOCOR) 20 MG tablet Take 20 mg by mouth at bedtime.    [provider]  triamcinolone lotion (KENALOG) 0.1 % Apply 1 application. topically 3 (three) times daily. Use for 7 days starting on 09/17/21.    [provider]  vitamin B-12 (CYANOCOBALAMIN) 1000 MCG tablet Take 1,000 mcg by mouth daily. 11/13/19   [provider]      Allergies    Wellbutrin [bupropion], Omnipaque [iohexol], Contrast media [iodinated contrast media], Depakote [divalproex sodium], Hydroxyzine, and Penicillin g    Review of Systems   Review of Systems  Physical Exam Updated Vital Signs BP 128/90 (BP Location: Right Arm)   Pulse 89   Temp 97.9 F (36.6 C) (Oral)   Resp 18   Ht '5\' 7"'$  (1.702 m)   Wt 114.8 kg   LMP 09/29/2021   SpO2 99%   BMI 39.63 kg/m  Physical Exam Vitals and nursing note reviewed.  Constitutional:      Appearance: She is obese.  Eyes:     Pupils: Pupils are equal, round, and reactive to light.  Cardiovascular:     Rate and Rhythm: Normal rate.  Pulmonary:     Breath sounds: No wheezing or rhonchi.  Musculoskeletal:        General: No tenderness.  Skin:    General: Skin is warm.  Neurological:     Mental Status: She is alert and oriented to person, place, and time.     ED Results / Procedures / Treatments   Labs (all labs ordered are listed, but only abnormal results are displayed) Labs Reviewed  COMPREHENSIVE METABOLIC PANEL - Abnormal; Notable for the following components:      Result Value   Anion gap 4 (*)    All other components within normal limits  SALICYLATE LEVEL - Abnormal; Notable for the following components:   Salicylate Lvl <8.1 (*)    All other  components within normal limits  ACETAMINOPHEN LEVEL - Abnormal; Notable for the following components:   Acetaminophen (Tylenol), Serum <10 (*)    All other components within normal limits  CBC - Abnormal; Notable for the following components:   WBC 14.6 (*)    Hemoglobin 10.9 (*)    HCT 34.8 (*)    MCH 25.5 (*)    All other components within normal limits  VALPROIC ACID LEVEL - Abnormal; Notable for the following components:  Valproic Acid Lvl <20 (*)    All other components within normal limits  RESP PANEL BY RT-PCR (FLU A&B, COVID) ARPGX2  ETHANOL  LITHIUM LEVEL  RAPID URINE DRUG SCREEN, HOSP PERFORMED  I-STAT BETA HCG BLOOD, ED ([facility], [facility], [facility] ONLY)    EKG EKG Interpretation  Date/Time:  Monday October 13 2021 19:49:17 EDT Ventricular Rate:  77 PR Interval:  180 QRS Duration: 88 QT Interval:  396 QTC Calculation: 448 R Axis:   60 Text Interpretation: Normal sinus rhythm Normal ECG When compared with ECG of 11-Oct-2021 21:41, PREVIOUS ECG IS PRESENT Confirmed by [name] [phone]) on 10/13/2021 8:49:37 PM  Radiology No results found.  Procedures Procedures    Medications Ordered in ED Medications  amLODipine (NORVASC) tablet 5 mg (has no administration in time range)  benztropine (COGENTIN) tablet 2 mg (has no administration in time range)  busPIRone (BUSPAR) tablet 10 mg (has no administration in time range)  cholecalciferol (VITAMIN D) tablet 1,000 Units (has no administration in time range)  glimepiride (AMARYL) tablet 4 mg (has no administration in time range)  guanFACINE (TENEX) tablet 1 mg (has no administration in time range)  insulin detemir (LEVEMIR) FlexPen 40 Units (has no administration in time range)  lithium carbonate (ESKALITH) CR tablet 450 mg (has no administration in time range)  metoprolol tartrate (LOPRESSOR) tablet 12.5 mg (has no administration in time range)  OLANZapine zydis (ZYPREXA) disintegrating tablet 10 mg (has no administration in  time range)  pantoprazole (PROTONIX) EC tablet 40 mg (has no administration in time range)  prazosin (MINIPRESS) capsule 5 mg (has no administration in time range)  primidone (MYSOLINE) tablet 100 mg (has no administration in time range)  sertraline (ZOLOFT) tablet 50 mg (has no administration in time range)  simvastatin (ZOCOR) tablet 20 mg (has no administration in time range)  vitamin B-12 (CYANOCOBALAMIN) tablet 1,000 mcg (has no administration in time range)    ED Course/ Medical Decision Making/ A&P                           Medical Decision Making Amount and/or Complexity of Data Reviewed Labs: ordered.   Patient presents with suicide thoughts.  States she wants to overdose.  History of hallucinations also.  [facility].  Also has seen ACT team.  States has not worked and she is doing worse.  At this point patient is medically cleared.  Lab work overall reassuring.  Will restart home medicines and have patient seen by TTS.        Final Clinical Impression(s) / ED Diagnoses Final diagnoses:  Suicidal ideation    Rx / DC Orders ED Discharge Orders     None         [name], MD 10/13/21 2049

## 2021-10-13 NOTE — ED Triage Notes (Signed)
Patient states she is suicidal and states her plan is to overdose with her diabetes medicine.  Patient states she is hearing voices and the voices are telling her that there are demons at her mom's house.  Patient denies HI, alcohol, or drugs.

## 2021-10-14 DIAGNOSIS — F250 Schizoaffective disorder, bipolar type: Secondary | ICD-10-CM

## 2021-10-14 LAB — RAPID URINE DRUG SCREEN, HOSP PERFORMED
Amphetamines: NOT DETECTED
Barbiturates: POSITIVE — AB
Benzodiazepines: NOT DETECTED
Cocaine: NOT DETECTED
Opiates: NOT DETECTED
Tetrahydrocannabinol: NOT DETECTED

## 2021-10-14 LAB — CBG MONITORING, ED: Glucose-Capillary: 126 mg/dL — ABNORMAL HIGH (ref 70–99)

## 2021-10-14 MED ORDER — PANTOPRAZOLE SODIUM 40 MG PO TBEC
40.0000 mg | DELAYED_RELEASE_TABLET | Freq: Every day | ORAL | 0 refills | Status: DC
Start: 2021-10-15 — End: 2021-12-25

## 2021-10-14 MED ORDER — INSULIN DETEMIR 100 UNIT/ML ~~LOC~~ SOLN
40.0000 [IU] | Freq: Two times a day (BID) | SUBCUTANEOUS | 11 refills | Status: DC
Start: 2021-10-14 — End: 2022-04-10

## 2021-10-14 MED ORDER — METOPROLOL TARTRATE 25 MG PO TABS: 12.5000 mg | ORAL_TABLET | Freq: Two times a day (BID) | ORAL | 0 refills | Status: DC

## 2021-10-14 MED ORDER — LITHIUM CARBONATE ER 450 MG PO TBCR
450.0000 mg | EXTENDED_RELEASE_TABLET | Freq: Two times a day (BID) | ORAL | 0 refills | Status: DC
Start: 2021-10-14 — End: 2021-12-25

## 2021-10-14 NOTE — ED Notes (Signed)
Patient DC d off unit to home,  Patient alert, calm, cooperative, no s/s of distress. DC information given to patient. Belongings given to patient.  Patient ambulatory off unit, escorted by NT. Patient transported by [facility].

## 2021-10-14 NOTE — ED Provider Notes (Addendum)
Emergency Medicine Observation Re-evaluation Note  [name] is a 31 y.o. female, seen on rounds today.  Pt initially presented to the ED for complaints of mental health issues and chronic, intermittent suicidal thoughts. This AM pt denies new c/o - resting, easily aroused. Currently denies SI.   Physical Exam  BP 104/68 (BP Location: Left Arm)   Pulse 73   Temp (!) 97.3 F (36.3 C) (Oral)   Resp 18   Ht 1.702 m ('5\' 7"'$ )   Wt 114.8 kg   LMP 09/29/2021   SpO2 98%   BMI 39.63 kg/m  Physical Exam General: resting, calm. Cardiac: regular rate. Lungs: breathing comfortably. Psych: normal mood and affect. Pt currently does not appear to be responding to internal stimuli.   ED Course / MDM    I have reviewed the labs performed to date as well as medications administered while in observation.  Recent changes in the last 24 hours include ED obs, BH reassessment and med management.   Plan  Behavioral health team to reassess patient and plan today.       Lajean Saver, MD 10/14/21 [phone]    [facility] team has reassessed and indicates pt is psych clear for d/c:    Pt denies any SI/HI.  Calm, cooperative, not responding to internal stimuli.  Pt currently appears stable for d/c per [facility] plan.        Lajean Saver, MD 10/14/21 1135

## 2021-10-14 NOTE — BH Assessment (Signed)
Clinician made contact with pt's nurse, Caryl Comes RN, at 0200 in an effort to complete pt's [place] Assessment. Pt's nurse replied at 0203 that pt is currently asleep and cannot participate in her [place] Assessment at this time. Pt's nurse stated she would inform clinician if pt wakes up. TTS to attempt assessment at a later time.

## 2021-10-14 NOTE — Discharge Instructions (Addendum)
It was our pleasure to provide your ER care today - we hope that you feel better.  For your behavioral health needs you are advised to continue treatment with the [facility] ACT Team:       [facility]      [address]      [place], [place] [address]      Office: [phone]      Crisis: [phone]   For mental health issues and/or crisis, you may also go directly to the [facility] - it is open 24/7, and walk-ins are welcome.

## 2021-10-14 NOTE — BH Assessment (Signed)
[place] Assessment Progress Note   Per Charmaine Downs, NP, this voluntary pt does not require psychiatric hospitalization at this time.  Pt is psychiatrically cleared.  Discharge instructions advise pt to continue treatment with the [facility] ACT Team.  Reginold Agent has spoken to them; they report that they will not be able to pick pt up today.  EDP Lajean Saver, MD and pt's nurse, [name], have been notified.  Jalene Mullet, [place] Triage Specialist [phone]

## 2021-10-14 NOTE — Discharge Summary (Signed)
[facility] Psych ED Discharge  10/14/2021 12:15 PM [name]  MRN:  [id number]  Principal Problem: Schizoaffective disorder, bipolar type [facility]) Discharge Diagnoses: Principal Problem:   Schizoaffective disorder, bipolar type ([place])  Clinical Impression:  Final diagnoses:  Acute stress reaction causing mixed disturbance of emotion and conduct  Mental health disorder   Subjective:  [name] is a 31 y/o female with a history of Schizophrenia who presented to the ER with c/o feeling suicidal with plan to OD.  She was also hearing voices at the time.  Patient was discharged from [facility] a day for same complaint prior to coming to [facility].  Patient was seen this morning calm, cooperative and engaged in meaningful conversation.  She reported recent breakup with her boyfriend led to her mood changes.  She also stated that she is talking to a new man in his life now.  Patient is engaged in ACT team -[facility] and they have been in contact with her since yesterday.  She is compliant with her medications, and keeps all appointments.  Patient today vehemently denies SI/HI/AVH and no mention of paranoia.  Only one inpatient Psychiatry unit hospitalization but requested outpatient Psychotherapy.  She lives alone in her apartment but mom is very close to her and they visit each other. [facility] who plans to [facility] her today after discharge.  Staff agrees to her discharge stating patient takes her medications regularly.  Patient is discharged and will be seen by her ACT team today or tomorrow.  ED Assessment Time Calculation: Start Time: 1151 Stop Time: 1215 Total Time in Minutes (Assessment Completion): 24   Past Psychiatric History: hx significant for Bipolar disorder, Schizophrenia, Depression, Anxiety.  One known inpatient Psychiatric hospitalization.  Past Medical History:  Past Medical History:  Diagnosis Date   Anxiety    Bipolar 1 disorder ([place])    Cognitive  deficits    Depression    Diabetes mellitus without complication ([place])    Hypertension    Mental disorder    Mental health disorder    Obesity     Past Surgical History:  Procedure Laterality Date   CESAREAN SECTION     CESAREAN SECTION N/A 04/25/2013   Procedure: REPEAT CESAREAN SECTION;  Surgeon: [name], MD;  Location: [facility];  Service: Obstetrics;  Laterality: N/A;   MASS EXCISION N/A 06/03/2012   Procedure: EXCISION MASS;  Surgeon: [name], MD;  Location: [facility];  Service: ENT;  Laterality: N/A;  Excision uvula mass   TONSILLECTOMY N/A 06/03/2012   Procedure: TONSILLECTOMY;  Surgeon: [name], MD;  Location: [facility];  Service: ENT;  Laterality: N/A;   TONSILLECTOMY     Family History:  Family History  Problem Relation Age of Onset   Hypertension Mother    Diabetes Father    Family Psychiatric  History: unknown Social History:  Social History   Substance and Sexual Activity  Alcohol Use Not Currently     Social History   Substance and Sexual Activity  Drug Use Not Currently   Types: "Crack" cocaine, Other-see comments   Comment: Patient reports hx of smoking Crack    Social History   Socioeconomic History   Marital status: Single    Spouse name: Not on file   Number of children: Not on file   Years of education: Not on file   Highest education level: Not on file  Occupational History   Not on file  Tobacco  Use   Smoking status: Every Day    Types: Cigars   Smokeless tobacco: Never   Tobacco comments:    Pt declined  Vaping Use   Vaping Use: Never used  Substance and Sexual Activity   Alcohol use: Not Currently   Drug use: Not Currently    Types: "Crack" cocaine, Other-see comments    Comment: Patient reports hx of smoking Crack   Sexual activity: Not Currently    Birth control/protection: None    Comment: occasionally  Other Topics Concern   Not on file  Social History Narrative   ** Merged  History Encounter **       Social Determinants of Health   Financial Resource Strain: Not on file  Food Insecurity: Not on file  Transportation Needs: Not on file  Physical Activity: Not on file  Stress: Not on file  Social Connections: Not on file    Tobacco Cessation:  N/A, patient does not currently use tobacco products  Current Medications: Current Facility-Administered Medications  Medication Dose Route Frequency Provider Last Rate Last Admin   amLODipine (NORVASC) tablet 5 mg  5 mg Oral Daily [name], MD   5 mg at 10/14/21 0958   benztropine (COGENTIN) tablet 2 mg  2 mg Oral BID [name], MD   2 mg at 10/14/21 0958   busPIRone (BUSPAR) tablet 10 mg  10 mg Oral TID [name], MD   10 mg at 10/14/21 0277   cholecalciferol (VITAMIN D) tablet 1,000 Units  1,000 Units Oral Daily [name], MD   1,000 Units at 10/14/21 0958   glimepiride (AMARYL) tablet 4 mg  4 mg Oral Q breakfast [name], MD   4 mg at 10/14/21 0958   guanFACINE (TENEX) tablet 1 mg  1 mg Oral QHS [name], MD       insulin detemir (LEVEMIR) injection 40 Units  40 Units Subcutaneous BID [name], MD   40 Units at 10/14/21 1002   lithium carbonate (ESKALITH) CR tablet 450 mg  450 mg Oral BID [name], MD   450 mg at 10/14/21 0959   metoprolol tartrate (LOPRESSOR) tablet 12.5 mg  12.5 mg Oral BID [name], MD   12.5 mg at 10/14/21 0959   OLANZapine zydis (ZYPREXA) disintegrating tablet 10 mg  10 mg Oral Q8H PRN [name], MD   10 mg at 10/13/21 2058   pantoprazole (PROTONIX) EC tablet 40 mg  40 mg Oral Daily [name], MD   40 mg at 10/14/21 0959   prazosin (MINIPRESS) capsule 5 mg  5 mg Oral QHS [name], MD   5 mg at 10/13/21 2121   primidone (MYSOLINE) tablet 100 mg  100 mg Oral TID [name], MD   100 mg at 10/14/21 0957   sertraline (ZOLOFT) tablet 50 mg  50 mg Oral Daily [name], MD   50 mg at  10/14/21 0959   simvastatin (ZOCOR) tablet 20 mg  20 mg Oral QHS [name], MD   20 mg at 10/13/21 2122   vitamin B-12 (CYANOCOBALAMIN) tablet 1,000 mcg  1,000 mcg Oral Daily [name], MD   1,000 mcg at 10/14/21 4128   Current Outpatient Medications  Medication Sig Dispense Refill   albuterol (VENTOLIN HFA) 108 (90 Base) MCG/ACT inhaler Inhale 1-2 puffs into the lungs every 6 (six) hours as needed for wheezing or shortness of breath. 18 g 0   amLODipine (NORVASC) 5 MG tablet Take 5 mg by mouth daily.  benztropine (COGENTIN) 2 MG tablet Take 2 mg by mouth 2 (two) times daily.     cholecalciferol (VITAMIN D) 25 MCG (1000 UNIT) tablet Take 1,000 Units by mouth daily.     Clobetasol Propionate 0.05 % shampoo Apply 1 application. topically daily.     glimepiride (AMARYL) 4 MG tablet Take 1 tablet (4 mg total) by mouth daily with breakfast.     hydrOXYzine (VISTARIL) 50 MG capsule Take 50 mg by mouth at bedtime as needed for anxiety.     PROZAC 40 MG capsule Take 40 mg by mouth daily.     simvastatin (ZOCOR) 20 MG tablet Take 20 mg by mouth at bedtime.     vitamin B-12 (CYANOCOBALAMIN) 1000 MCG tablet Take 1,000 mcg by mouth daily.     insulin detemir (LEVEMIR) 100 UNIT/ML injection Inject 0.4 mLs (40 Units total) into the skin 2 (two) times daily. 10 mL 11   lithium carbonate (ESKALITH) 450 MG CR tablet Take 1 tablet (450 mg total) by mouth 2 (two) times daily. 60 tablet 0   metoprolol tartrate (LOPRESSOR) 25 MG tablet Take 0.5 tablets (12.5 mg total) by mouth 2 (two) times daily. 30 tablet 0   [START ON 10/15/2021] pantoprazole (PROTONIX) 40 MG tablet Take 1 tablet (40 mg total) by mouth daily. 30 tablet 0   PTA Medications: (Not in a hospital admission)   [place] Scale:  [place] ED from 10/13/2021 in [facility] ED from 10/11/2021 in [facility] ED from 09/28/2021 in [facility] CATEGORY High Risk High Risk High Risk       Musculoskeletal: Strength & Muscle Tone: within normal limits Gait & Station: normal Patient leans: Front  Psychiatric Specialty Exam: Presentation  General Appearance: Appropriate for Environment; Casual; Neat  Eye Contact:Good  Speech:Clear and Coherent; Normal Rate  Speech Volume:Normal  Handedness:Right   Mood and Affect  Mood:Euthymic  Affect:Congruent   Thought Process  Thought Processes:Coherent; Goal Directed; Linear  Descriptions of Associations:Intact  Orientation:Full (Time, Place and Person)  Thought Content:Logical  History of Schizophrenia/Schizoaffective disorder:Yes  Duration of Psychotic Symptoms:Greater than six months  Hallucinations:Hallucinations: None  Ideas of Reference:None  Suicidal Thoughts:Suicidal Thoughts: No  Homicidal Thoughts:Homicidal Thoughts: No   Sensorium  Memory:Immediate Good; Recent Good; Remote Good  Judgment:Good  Insight:Good   Executive Functions  Concentration:Good  Attention Span:Good  [place] of Knowledge:Good  Language:Good   Psychomotor Activity  Psychomotor Activity:Psychomotor Activity: Normal   Assets  Assets:Communication Skills; Desire for Improvement; Housing; Resilience; Social Support   Sleep  Sleep:Sleep: Good    Physical Exam: Physical Exam Vitals and nursing note reviewed.  Constitutional:      Appearance: Normal appearance.  HENT:     Head: Normocephalic and atraumatic.     Nose: Nose normal.  Cardiovascular:     Rate and Rhythm: Normal rate and regular rhythm.  Pulmonary:     Effort: Pulmonary effort is normal.  Musculoskeletal:        General: Normal range of motion.     Cervical back: Normal range of motion.  Skin:    General: Skin is warm and dry.  Neurological:     General: No focal deficit present.     Mental Status: She is alert and oriented to person, place, and time.     Review of Systems  Constitutional: Negative.   HENT: Negative.    Eyes: Negative.   Respiratory: Negative.  Cardiovascular: Negative.   Gastrointestinal: Negative.   Genitourinary: Negative.   Musculoskeletal: Negative.   Skin: Negative.   Neurological: Negative.   Endo/Heme/Allergies: Negative.   Psychiatric/Behavioral:  Positive for depression.    Blood pressure 131/75, pulse 81, temperature (!) 97.4 F (36.3 C), resp. rate 16, height '5\' 7"'$  (1.702 m), weight 114.8 kg, last menstrual period 09/29/2021, SpO2 99 %. Body mass index is 39.63 kg/m.   Demographic Factors:  Adolescent or young adult, Low socioeconomic status, Living alone, and Unemployed  Loss Factors: Loss of significant relationship  Historical Factors: Prior suicide attempts  Risk Reduction Factors:   Positive social support and Positive therapeutic relationship  Continued Clinical Symptoms:  Schizophrenia:   Less than 21 years old  Cognitive Features That Contribute To Risk:  None    Suicide Risk:  Mild:  Suicidal ideation of limited frequency, intensity, duration, and specificity.  There are no identifiable plans, no associated intent, mild dysphoria and related symptoms, good self-control (both objective and subjective assessment), few other risk factors, and identifiable protective factors, including available and accessible social support.   [place].   Specialty: Urgent Care Contact information: [place] [phone]                Plan Of Care/Follow-up recommendations:  Activity:  as tolerated Diet:  Diabetic diet  Medical Decision Making: Patient denies SI/HI/AVH, She is not a danger to herself or others.  She is engaged in ACT team that manages her Mental health and Medications.  ACT Team plans to resume care today and she is safe for discharge.  Problem  1: Schizophrenia  Problem 2: Schizoaffective disorder, Bipolar type  Problem 3: MDD, Recurrent moderate  Disposition: Discharge  Delfin Gant, NP-PMHNP-BC 10/14/2021, 12:15 PM

## 2021-10-17 ENCOUNTER — Telehealth (HOSPITAL_COMMUNITY): Payer: Self-pay | Admitting: Physician Assistant

## 2021-10-20 ENCOUNTER — Encounter (HOSPITAL_COMMUNITY): Payer: Self-pay

## 2021-10-20 ENCOUNTER — Emergency Department (HOSPITAL_COMMUNITY)
Admission: EM | Admit: 2021-10-20 | Discharge: 2021-10-20 | Disposition: A | Discharge status: Home / Self Care | Payer: Medicaid Other | Attending: Emergency Medicine | Admitting: Emergency Medicine

## 2021-10-20 DIAGNOSIS — R194 Change in bowel habit: Secondary | ICD-10-CM | POA: Diagnosis not present

## 2021-10-20 DIAGNOSIS — R112 Nausea with vomiting, unspecified: Secondary | ICD-10-CM | POA: Insufficient documentation

## 2021-10-20 DIAGNOSIS — R197 Diarrhea, unspecified: Secondary | ICD-10-CM | POA: Insufficient documentation

## 2021-10-20 LAB — COMPREHENSIVE METABOLIC PANEL
ALT: 24 U/L (ref 0–44)
AST: 22 U/L (ref 15–41)
Albumin: 4.1 g/dL (ref 3.5–5.0)
Alkaline Phosphatase: 48 U/L (ref 38–126)
Anion gap: 3 — ABNORMAL LOW (ref 5–15)
BUN: 10 mg/dL (ref 6–20)
CO2: 28 mmol/L (ref 22–32)
Calcium: 9.1 mg/dL (ref 8.9–10.3)
Chloride: 110 mmol/L (ref 98–111)
Creatinine, Ser: 0.82 mg/dL (ref 0.44–1.00)
GFR, Estimated: >60 mL/min (ref >60)
Glucose, Bld: 139 mg/dL — ABNORMAL HIGH (ref 70–99)
Potassium: 3.7 mmol/L (ref 3.5–5.1)
Sodium: 141 mmol/L (ref 135–145)
Total Bilirubin: 0.5 mg/dL (ref 0.3–1.2)
Total Protein: 7.5 g/dL (ref 6.5–8.1)

## 2021-10-20 LAB — CBC WITH DIFFERENTIAL/PLATELET
Abs Immature Granulocytes: 0.04 10*3/uL (ref 0.00–0.07)
Basophils Absolute: 0.1 10*3/uL (ref 0.0–0.1)
Basophils Relative: 1 %
Eosinophils Absolute: 0.6 10*3/uL — ABNORMAL HIGH (ref 0.0–0.5)
Eosinophils Relative: 5 %
HCT: 36 % (ref 36.0–46.0)
Hemoglobin: 11.2 g/dL — ABNORMAL LOW (ref 12.0–15.0)
Immature Granulocytes: 0 %
Lymphocytes Relative: 24 %
Lymphs Abs: 2.6 10*3/uL (ref 0.7–4.0)
MCH: 25.2 pg — ABNORMAL LOW (ref 26.0–34.0)
MCHC: 31.1 g/dL (ref 30.0–36.0)
MCV: 81.1 fL (ref 80.0–100.0)
Monocytes Absolute: 0.5 10*3/uL (ref 0.1–1.0)
Monocytes Relative: 5 %
Neutro Abs: 6.9 10*3/uL (ref 1.7–7.7)
Neutrophils Relative %: 65 %
Platelets: 260 10*3/uL (ref 150–400)
RBC: 4.44 MIL/uL (ref 3.87–5.11)
RDW: 15.3 % (ref 11.5–15.5)
WBC: 10.7 10*3/uL — ABNORMAL HIGH (ref 4.0–10.5)
nRBC: 0 % (ref 0.0–0.2)

## 2021-10-20 LAB — I-STAT BETA HCG BLOOD, ED (MC, WL, AP ONLY): I-stat hCG, quantitative: <5 m[IU]/mL (ref <5)

## 2021-10-20 MED ORDER — SODIUM CHLORIDE 0.9 % IV BOLUS
500.0000 mL | Freq: Once | INTRAVENOUS | Status: AC
  Administered 2021-10-20: 500 mL via INTRAVENOUS

## 2021-10-20 MED ORDER — ONDANSETRON HCL 4 MG PO TABS: 4.0000 mg | ORAL_TABLET | Freq: Four times a day (QID) | ORAL | 0 refills | Status: DC

## 2021-10-20 MED ORDER — ONDANSETRON 4 MG PO TBDP
4.0000 mg | ORAL_TABLET | Freq: Once | ORAL | Status: AC
Start: 2021-10-20 — End: 2021-10-20
  Administered 2021-10-20: 4 mg via ORAL
  Filled 2021-10-20: qty 1

## 2021-10-22 ENCOUNTER — Encounter (HOSPITAL_COMMUNITY): Payer: Self-pay

## 2021-10-22 ENCOUNTER — Emergency Department (HOSPITAL_COMMUNITY)
Admission: EM | Admit: 2021-10-22 | Discharge: 2021-10-22 | Discharge status: Against Medical Advice | Payer: Medicaid Other | Attending: Emergency Medicine | Admitting: Emergency Medicine

## 2021-10-22 DIAGNOSIS — Z5321 Procedure and treatment not carried out due to patient leaving prior to being seen by health care provider: Secondary | ICD-10-CM | POA: Diagnosis not present

## 2021-10-22 DIAGNOSIS — R42 Dizziness and giddiness: Secondary | ICD-10-CM | POA: Diagnosis present

## 2021-10-22 LAB — CBC WITH DIFFERENTIAL/PLATELET
Abs Immature Granulocytes: 0.03 10*3/uL (ref 0.00–0.07)
Basophils Absolute: 0.1 10*3/uL (ref 0.0–0.1)
Basophils Relative: 1 %
Eosinophils Absolute: 0.7 10*3/uL — ABNORMAL HIGH (ref 0.0–0.5)
Eosinophils Relative: 6 %
HCT: 38.2 % (ref 36.0–46.0)
Hemoglobin: 11.9 g/dL — ABNORMAL LOW (ref 12.0–15.0)
Immature Granulocytes: 0 %
Lymphocytes Relative: 28 %
Lymphs Abs: 3.5 10*3/uL (ref 0.7–4.0)
MCH: 25.3 pg — ABNORMAL LOW (ref 26.0–34.0)
MCHC: 31.2 g/dL (ref 30.0–36.0)
MCV: 81.1 fL (ref 80.0–100.0)
Monocytes Absolute: 0.7 10*3/uL (ref 0.1–1.0)
Monocytes Relative: 6 %
Neutro Abs: 7.7 10*3/uL (ref 1.7–7.7)
Neutrophils Relative %: 59 %
Platelets: 279 10*3/uL (ref 150–400)
RBC: 4.71 MIL/uL (ref 3.87–5.11)
RDW: 15.4 % (ref 11.5–15.5)
WBC: 12.7 10*3/uL — ABNORMAL HIGH (ref 4.0–10.5)
nRBC: 0 % (ref 0.0–0.2)

## 2021-10-22 NOTE — ED Provider Triage Note (Signed)
Emergency Medicine Provider Triage Evaluation Note  [name] , a 31 y.o. female  was evaluated in triage.  Pt complains of lightheadedness.  Recently had a GI illness with vomiting, and diarrhea.  States this cleared up yesterday.  States she has been drinking tea, and water.  Denies abdominal pain, headache, vision change, or other complaints..  Review of Systems  Positive: As above Negative: As above  Physical Exam  BP (!) 146/97 (BP Location: Left Arm)   Pulse (!) 103   Temp 98.6 F (37 C) (Oral)   Resp 20   LMP 09/29/2021   SpO2 96%  Gen:   Awake, no distress   Resp:  Normal effort  MSK:   Moves extremities without difficulty  Other:    Medical Decision Making  Medically screening exam initiated at 5:10 PM.  Appropriate orders placed.  [name] was informed that the remainder of the evaluation will be completed by another provider, this initial triage assessment does not replace that evaluation, and the importance of remaining in the ED until their evaluation is complete.     Evlyn Courier, PA-C 10/22/21 1710

## 2021-10-22 NOTE — ED Triage Notes (Signed)
Pt arrives via [facility] from home for dizziness. Pt states this began last night. No falls/LOC. Also asking that her potassium and pregnancy test be check. Denies any other complaints.

## 2021-10-23 ENCOUNTER — Emergency Department (HOSPITAL_COMMUNITY)
Admission: EM | Admit: 2021-10-23 | Discharge: 2021-10-23 | Disposition: A | Discharge status: Home / Self Care | Payer: Medicaid Other | Attending: Emergency Medicine | Admitting: Emergency Medicine

## 2021-10-23 ENCOUNTER — Encounter (HOSPITAL_COMMUNITY): Payer: Self-pay | Admitting: Emergency Medicine

## 2021-10-23 ENCOUNTER — Other Ambulatory Visit: Payer: Self-pay

## 2021-10-23 DIAGNOSIS — Z79899 Other long term (current) drug therapy: Secondary | ICD-10-CM | POA: Insufficient documentation

## 2021-10-23 DIAGNOSIS — I10 Essential (primary) hypertension: Secondary | ICD-10-CM | POA: Diagnosis not present

## 2021-10-23 DIAGNOSIS — E119 Type 2 diabetes mellitus without complications: Secondary | ICD-10-CM | POA: Insufficient documentation

## 2021-10-23 DIAGNOSIS — N61 Mastitis without abscess: Secondary | ICD-10-CM | POA: Insufficient documentation

## 2021-10-23 DIAGNOSIS — N644 Mastodynia: Secondary | ICD-10-CM | POA: Diagnosis present

## 2021-10-23 DIAGNOSIS — Z794 Long term (current) use of insulin: Secondary | ICD-10-CM | POA: Diagnosis not present

## 2021-10-23 DIAGNOSIS — Z7984 Long term (current) use of oral hypoglycemic drugs: Secondary | ICD-10-CM | POA: Insufficient documentation

## 2021-10-23 MED ORDER — DOXYCYCLINE HYCLATE 100 MG PO TABS
100.0000 mg | ORAL_TABLET | Freq: Once | ORAL | Status: AC
  Administered 2021-10-23: 100 mg via ORAL
  Filled 2021-10-23: qty 1

## 2021-10-23 MED ORDER — IBUPROFEN 200 MG PO TABS
400.0000 mg | ORAL_TABLET | Freq: Once | ORAL | Status: AC
  Administered 2021-10-23: 400 mg via ORAL
  Filled 2021-10-23: qty 2

## 2021-10-23 MED ORDER — DOXYCYCLINE HYCLATE 100 MG PO CAPS: 100.0000 mg | ORAL_CAPSULE | Freq: Two times a day (BID) | ORAL | 0 refills | Status: DC

## 2021-10-23 NOTE — ED Triage Notes (Signed)
31 yo female presents c/o left breast pain x 1 day. Pt states the pain is sharp and located on around her left nipple. Pt denies redness or discharge from nipples, pt also denies any discoloration of the breast. Pt denies fever. No other complaints at this time.

## 2021-10-23 NOTE — Discharge Instructions (Addendum)
The antibiotic was sent to your pharmacy.  Keep an eye on it and if the redness worsens and the pain becomes more severe return to the emergency room.  You can take 2 ibuprofen every 6 hours as needed for the discomfort.

## 2021-10-23 NOTE — ED Provider Notes (Signed)
[facility] DEPT Provider Note   CSN: [id number] Arrival date & time: 10/23/21  1844     History  Chief Complaint  Patient presents with   Breast Pain    [name] is a 31 y.o. female.  Patient is a 31 year old female with a history of developmental delay, bipolar disease, hypertension, diabetes with a care plan as she is seen in the emergency room frequently presenting today complaining of left breast pain.  She reports its been painful since she woke up but has been worsening throughout the day.  She cannot recall any trauma, different fitting bras or other reasons why it would be hurting.  She denies any systemic symptoms.  She is not breast-feeding.  The history is provided by the patient.       Home Medications Prior to Admission medications   Medication Sig Start Date End Date Taking? Authorizing Provider  doxycycline (VIBRAMYCIN) 100 MG capsule Take 1 capsule (100 mg total) by mouth 2 (two) times daily. 10/23/21  Yes Thelbert Gartin, Loree Fee, MD  albuterol (VENTOLIN HFA) 108 (90 Base) MCG/ACT inhaler Inhale 1-2 puffs into the lungs every 6 (six) hours as needed for wheezing or shortness of breath. 01/11/21   Pearson Forster, NP  amLODipine (NORVASC) 5 MG tablet Take 5 mg by mouth daily.    [provider]  benztropine (COGENTIN) 2 MG tablet Take 2 mg by mouth 2 (two) times daily.    [provider]  cholecalciferol (VITAMIN D) 25 MCG (1000 UNIT) tablet Take 1,000 Units by mouth daily. 04/30/20   [provider]  Clobetasol Propionate 0.05 % shampoo Apply 1 application. topically daily.    [provider]  glimepiride (AMARYL) 4 MG tablet Take 1 tablet (4 mg total) by mouth daily with breakfast. 09/30/21   Revonda Humphrey, NP  hydrOXYzine (VISTARIL) 50 MG capsule Take 50 mg by mouth at bedtime as needed for anxiety.    [provider]  insulin detemir (LEVEMIR) 100 UNIT/ML injection Inject 0.4 mLs (40  Units total) into the skin 2 (two) times daily. 10/14/21   Delfin Gant, NP  lithium carbonate (ESKALITH) 450 MG CR tablet Take 1 tablet (450 mg total) by mouth 2 (two) times daily. 10/14/21 11/13/21  Delfin Gant, NP  metoprolol tartrate (LOPRESSOR) 25 MG tablet Take 0.5 tablets (12.5 mg total) by mouth 2 (two) times daily. 10/14/21 11/13/21  Delfin Gant, NP  ondansetron (ZOFRAN) 4 MG tablet Take 1 tablet (4 mg total) by mouth every 6 (six) hours. 10/20/21   Valarie Merino, MD  pantoprazole (PROTONIX) 40 MG tablet Take 1 tablet (40 mg total) by mouth daily. 10/15/21 11/14/21  Delfin Gant, NP  PROZAC 40 MG capsule Take 40 mg by mouth daily. 05/12/21   [provider]  simvastatin (ZOCOR) 20 MG tablet Take 20 mg by mouth at bedtime.    [provider]  vitamin B-12 (CYANOCOBALAMIN) 1000 MCG tablet Take 1,000 mcg by mouth daily. 11/13/19   [provider]      Allergies    Wellbutrin [bupropion], Omnipaque [iohexol], Contrast media [iodinated contrast media], Depakote [divalproex sodium], Hydroxyzine, and Penicillin g    Review of Systems   Review of Systems  Physical Exam Updated Vital Signs BP (!) 132/97 (BP Location: Left Arm)   Pulse 95   Temp 98 F (36.7 C) (Oral)   Resp 18   Ht '5\' 7"'$  (1.702 m)   Wt 117.9 kg  LMP 09/29/2021 (Approximate)   SpO2 99%   BMI 40.72 kg/m  Physical Exam Vitals and nursing note reviewed.  Cardiovascular:     Rate and Rhythm: Normal rate.  Pulmonary:     Effort: Pulmonary effort is normal.  Chest:  Breasts:    Left: Tenderness present. No mass or nipple discharge.    Neurological:     Mental Status: She is alert.     ED Results / Procedures / Treatments   Labs (all labs ordered are listed, but only abnormal results are displayed) Labs Reviewed - No data to display  EKG None  Radiology No results found.  Procedures Procedures    Medications Ordered in ED Medications  doxycycline  (VIBRA-TABS) tablet 100 mg (has no administration in time range)  ibuprofen (ADVIL) tablet 400 mg (has no administration in time range)    ED Course/ Medical Decision Making/ A&P                           Medical Decision Making Risk OTC drugs. Prescription drug management.   Patient presenting today complaining of breast pain.  Denies any trauma.  Does have some mild erythema noted to the breast in the area that is tender but no induration.  Ultrasound without evidence of abscess.  No nipple involvement.  We will treat with doxycycline and ibuprofen.  Patient given return precautions.        Final Clinical Impression(s) / ED Diagnoses Final diagnoses:  Cellulitis of left breast    Rx / DC Orders ED Discharge Orders          Ordered    doxycycline (VIBRAMYCIN) 100 MG capsule  2 times daily        10/23/21 1944              Blanchie Dessert, MD 10/23/21 1951

## 2021-10-24 ENCOUNTER — Encounter (HOSPITAL_COMMUNITY): Payer: Self-pay

## 2021-10-24 ENCOUNTER — Emergency Department (HOSPITAL_COMMUNITY)
Admission: EM | Admit: 2021-10-24 | Discharge: 2021-10-25 | Discharge status: Against Medical Advice | Payer: Medicaid Other | Attending: Emergency Medicine | Admitting: Emergency Medicine

## 2021-10-24 ENCOUNTER — Ambulatory Visit (HOSPITAL_COMMUNITY)
Admission: EM | Admit: 2021-10-24 | Discharge: 2021-10-24 | Disposition: A | Discharge status: Home / Self Care | Payer: Medicaid Other | Attending: Psychiatry | Admitting: Psychiatry

## 2021-10-24 ENCOUNTER — Other Ambulatory Visit: Payer: Self-pay

## 2021-10-24 DIAGNOSIS — Z5321 Procedure and treatment not carried out due to patient leaving prior to being seen by health care provider: Secondary | ICD-10-CM | POA: Diagnosis not present

## 2021-10-24 DIAGNOSIS — F250 Schizoaffective disorder, bipolar type: Secondary | ICD-10-CM | POA: Diagnosis not present

## 2021-10-24 DIAGNOSIS — Z79899 Other long term (current) drug therapy: Secondary | ICD-10-CM | POA: Insufficient documentation

## 2021-10-24 DIAGNOSIS — R442 Other hallucinations: Secondary | ICD-10-CM | POA: Insufficient documentation

## 2021-10-24 NOTE — Discharge Instructions (Addendum)
It is our recommendation that patient receive assistance from her ACT to receive more assistance with a CST. Patient would benefit from more 1:1 interactions and therapy to help her learn to manage her emotions and feel less overwhelmed. Patient was agreeable to this and endorsed belief that she would be less likely to attempt to overdose if she had this extra attention.
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
## 2021-10-24 NOTE — Progress Notes (Signed)
[name] was escorted to the lobby, given her AVS and taxi voucher. She received her personal belongings.
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
## 2021-10-24 NOTE — ED Triage Notes (Signed)
Pt presents to [facility] voluntarily seeking medication management. Pt states that she feels like her medication dosage needs to be increased because she does not feel like it is working properly. Pt states she is beginning to have SI with a plan to overdose if her symptoms continue to worsen. Pt denies HI and AVH at this time.

## 2021-10-25 ENCOUNTER — Ambulatory Visit (HOSPITAL_COMMUNITY)
Admission: EM | Admit: 2021-10-25 | Discharge: 2021-10-27 | Disposition: A | Discharge status: Other Acute Inpatient Hospital | Payer: Medicaid Other | Attending: Behavioral Health | Admitting: Behavioral Health

## 2021-10-25 DIAGNOSIS — F259 Schizoaffective disorder, unspecified: Secondary | ICD-10-CM | POA: Insufficient documentation

## 2021-10-25 DIAGNOSIS — R45851 Suicidal ideations: Secondary | ICD-10-CM | POA: Insufficient documentation

## 2021-10-25 DIAGNOSIS — Z20822 Contact with and (suspected) exposure to covid-19: Secondary | ICD-10-CM | POA: Insufficient documentation

## 2021-10-25 LAB — POCT URINE DRUG SCREEN - MANUAL ENTRY (I-SCREEN)
POC Amphetamine UR: NOT DETECTED
POC Buprenorphine (BUP): NOT DETECTED
POC Cocaine UR: NOT DETECTED
POC Marijuana UR: NOT DETECTED
POC Methadone UR: NOT DETECTED
POC Methamphetamine UR: NOT DETECTED
POC Morphine: NOT DETECTED
POC Oxazepam (BZO): NOT DETECTED
POC Oxycodone UR: NOT DETECTED
POC Secobarbital (BAR): NOT DETECTED

## 2021-10-25 LAB — CBC WITH DIFFERENTIAL/PLATELET
Abs Immature Granulocytes: 0.05 10*3/uL (ref 0.00–0.07)
Basophils Absolute: 0.1 10*3/uL (ref 0.0–0.1)
Basophils Relative: 0 %
Eosinophils Absolute: 0.5 10*3/uL (ref 0.0–0.5)
Eosinophils Relative: 3 %
HCT: 32.6 % — ABNORMAL LOW (ref 36.0–46.0)
Hemoglobin: 10 g/dL — ABNORMAL LOW (ref 12.0–15.0)
Immature Granulocytes: 0 %
Lymphocytes Relative: 20 %
Lymphs Abs: 3 10*3/uL (ref 0.7–4.0)
MCH: 24.8 pg — ABNORMAL LOW (ref 26.0–34.0)
MCHC: 30.7 g/dL (ref 30.0–36.0)
MCV: 80.7 fL (ref 80.0–100.0)
Monocytes Absolute: 0.8 10*3/uL (ref 0.1–1.0)
Monocytes Relative: 6 %
Neutro Abs: 10.5 10*3/uL — ABNORMAL HIGH (ref 1.7–7.7)
Neutrophils Relative %: 71 %
Platelets: 213 10*3/uL (ref 150–400)
RBC: 4.04 MIL/uL (ref 3.87–5.11)
RDW: 15.4 % (ref 11.5–15.5)
WBC: 14.9 10*3/uL — ABNORMAL HIGH (ref 4.0–10.5)
nRBC: 0 % (ref 0.0–0.2)

## 2021-10-25 LAB — LITHIUM LEVEL: Lithium Lvl: 0.54 mmol/L — ABNORMAL LOW (ref 0.60–1.20)

## 2021-10-25 LAB — COMPREHENSIVE METABOLIC PANEL
ALT: 18 U/L (ref 0–44)
AST: 18 U/L (ref 15–41)
Albumin: 3.6 g/dL (ref 3.5–5.0)
Alkaline Phosphatase: 52 U/L (ref 38–126)
Anion gap: 10 (ref 5–15)
BUN: 5 mg/dL — ABNORMAL LOW (ref 6–20)
CO2: 22 mmol/L (ref 22–32)
Calcium: 9.2 mg/dL (ref 8.9–10.3)
Chloride: 109 mmol/L (ref 98–111)
Creatinine, Ser: 0.88 mg/dL (ref 0.44–1.00)
GFR, Estimated: >60 mL/min (ref >60)
Glucose, Bld: 197 mg/dL — ABNORMAL HIGH (ref 70–99)
Potassium: 4.3 mmol/L (ref 3.5–5.1)
Sodium: 141 mmol/L (ref 135–145)
Total Bilirubin: 0.6 mg/dL (ref 0.3–1.2)
Total Protein: 6.4 g/dL — ABNORMAL LOW (ref 6.5–8.1)

## 2021-10-25 LAB — RESP PANEL BY RT-PCR (FLU A&B, COVID) ARPGX2
Influenza A by PCR: NEGATIVE
Influenza B by PCR: NEGATIVE
SARS Coronavirus 2 by RT PCR: NEGATIVE

## 2021-10-25 LAB — ETHANOL: Alcohol, Ethyl (B): <10 mg/dL (ref <10)

## 2021-10-25 LAB — POCT PREGNANCY, URINE: Preg Test, Ur: NEGATIVE

## 2021-10-25 LAB — GLUCOSE, CAPILLARY: Glucose-Capillary: 184 mg/dL — ABNORMAL HIGH (ref 70–99)

## 2021-10-25 LAB — POC SARS CORONAVIRUS 2 AG -  ED: SARS Coronavirus 2 Ag: NEGATIVE

## 2021-10-25 MED ORDER — HALOPERIDOL 5 MG PO TABS
10.0000 mg | ORAL_TABLET | Freq: Every day | ORAL | Status: DC
  Administered 2021-10-25 – 2021-10-26 (×2): 10 mg via ORAL
  Filled 2021-10-25 (×2): qty 2

## 2021-10-25 MED ORDER — AMLODIPINE BESYLATE 5 MG PO TABS
5.0000 mg | ORAL_TABLET | Freq: Every day | ORAL | Status: DC
  Administered 2021-10-26 – 2021-10-27 (×2): 5 mg via ORAL
  Filled 2021-10-25 (×2): qty 1

## 2021-10-25 MED ORDER — DOXYCYCLINE HYCLATE 100 MG PO TABS
100.0000 mg | ORAL_TABLET | Freq: Two times a day (BID) | ORAL | Status: DC
  Administered 2021-10-25 – 2021-10-27 (×5): 100 mg via ORAL
  Filled 2021-10-25 (×6): qty 1

## 2021-10-25 MED ORDER — SIMVASTATIN 20 MG PO TABS
20.0000 mg | ORAL_TABLET | Freq: Every day | ORAL | Status: DC
  Administered 2021-10-26 – 2021-10-27 (×2): 20 mg via ORAL
  Filled 2021-10-25 (×2): qty 1

## 2021-10-25 MED ORDER — IBUPROFEN 400 MG PO TABS: 400.0000 mg | ORAL_TABLET | Freq: Four times a day (QID) | ORAL | Status: DC | PRN

## 2021-10-25 MED ORDER — HYDROXYZINE PAMOATE 50 MG PO CAPS: 50.0000 mg | ORAL_CAPSULE | Freq: Every evening | ORAL | Status: DC | PRN

## 2021-10-25 MED ORDER — MAGNESIUM HYDROXIDE 400 MG/5ML PO SUSP: 30.0000 mL | Freq: Every day | ORAL | Status: DC | PRN

## 2021-10-25 MED ORDER — METOPROLOL TARTRATE 25 MG PO TABS
12.5000 mg | ORAL_TABLET | Freq: Two times a day (BID) | ORAL | Status: DC
  Administered 2021-10-25 – 2021-10-27 (×4): 12.5 mg via ORAL
  Filled 2021-10-25 (×4): qty 1

## 2021-10-25 MED ORDER — ACETAMINOPHEN 325 MG PO TABS: 650.0000 mg | ORAL_TABLET | Freq: Four times a day (QID) | ORAL | Status: DC | PRN

## 2021-10-25 MED ORDER — ALUM & MAG HYDROXIDE-SIMETH 200-200-20 MG/5ML PO SUSP: 30.0000 mL | ORAL | Status: DC | PRN

## 2021-10-25 MED ORDER — INSULIN DETEMIR 100 UNIT/ML ~~LOC~~ SOLN
40.0000 [IU] | Freq: Two times a day (BID) | SUBCUTANEOUS | Status: DC
  Administered 2021-10-25 – 2021-10-27 (×4): 40 [IU] via SUBCUTANEOUS

## 2021-10-25 MED ORDER — BENZTROPINE MESYLATE 0.5 MG PO TABS
0.5000 mg | ORAL_TABLET | Freq: Two times a day (BID) | ORAL | Status: DC
  Administered 2021-10-25 – 2021-10-27 (×4): 0.5 mg via ORAL
  Filled 2021-10-25 (×4): qty 1

## 2021-10-25 MED ORDER — GLIMEPIRIDE 2 MG PO TABS
4.0000 mg | ORAL_TABLET | Freq: Every day | ORAL | Status: DC
  Administered 2021-10-26 – 2021-10-27 (×2): 4 mg via ORAL
  Filled 2021-10-25 (×2): qty 2

## 2021-10-25 NOTE — ED Notes (Signed)
Pt received snack.
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
## 2021-10-25 NOTE — ED Notes (Signed)
Pt arrived to the unit at 1:30 pm

## 2021-10-25 NOTE — ED Notes (Signed)
Pt admitted to [facility] Continuous assessment unit. Patient states she is feeling depressed, and is tearful. She does report passive SI when writer questions patient she states '' I've just been through [employer] and declines to elaborate further but is able to agree she has no thoughts currently to hurt herself and states '' I'll be safe in here. '' Patient given snack and meal per pt request and juice. Pt up to use phone. Pt is safe will con't to monitor.

## 2021-10-25 NOTE — ED Notes (Signed)
Pt A&O x 4, resting at present, no distress noted.  Calm & cooperative.  SI, contracts for safety.  Monitoring for safety.

## 2021-10-25 NOTE — ED Provider Notes (Signed)
[facility] Continuous Assessment Admission H&P  Date: 10/25/21 Patient Name: [name] MRN: [id number]  Diagnoses:  Final diagnoses:  Suicidal ideation    HPI: [name] is a 31 year old female patient who presents to the [facility] behavioral health urgent care voluntary accompanied by GPD with a chief complaint of SI.  Patient seen and evaluated face-to-face by this provider, chart reviewed and case discussed with Dr. Lovette Cliche.  On evaluation, patient is alert and oriented x4. Her thought process is linear and speech is clear and coherent.  Her mood is dysphoric and affect is congruent.   Patient states that she has a lot going on. She states that yesterday she tried to overdose on her depression medicine and 20 extra units of insulin and then went to [facility]. She states that while at [facility] they left her in the triage area and did not take her seriously so she left. She states that last night she attempted to overdose by talking extra lithium, buspar and other unknown medications. She states that she is supposed to take Lithium 900 mg but took 1,000 mg. She reports taking 8 extra pills of her other medications that she is unable to name. She denies vomiting, diarrhea or pasting out after taking the extra medications yesterday. Today, she denies any physical symptoms, no N/V, loose stools, headaches, chest pains, SOB or drowsiness. V/S are stable. She does not appear to be in acute distress.   She identifies her current stressor as her mother calling her a "demon."  She states that she has been visiting her mother and that her mother thinks that she wants to live with her and told her she cannot bring that "demon shit over here." She states that she likes visiting her mother because she has a small puppy that she likes. She endorses suicidal ideations and states that she plans to finally take her life. She reports 10 previous suicide attempts in  the past. She denies self-injurious behaviors. She denies homicidal ideations. She denies auditory and visual hallucinations. There is no objective evidence that she is currently responding to internal or external stimuli. She reports feeling depressed and describes her symptoms as feeling sad, hopeless, and worthless. She denies drinking alcohol or using illicit drugs. She states that she only wants to go to behavioral health hospital and does not want to go to [facility] or [facility]. She states that she keeps telling the the ACT Team that she needs to live in a group home.   Patient receives outpatient psychiatric services with the ACT Team for medication management. She reports taking lithium, insulin, blood pressure medicine and Buspar. She is unable recall her current medications. Medications verified by pharmacy.    PHQ 2-9:  [facility] ED from 10/24/2021 in [place] from [facility]/05/[facility] in [facility] ED from 05/26/2020 in [facility]  Thoughts that you would be better off dead, or of hurting yourself in some way Several days Not at all More than half the days  PHQ-9 Total Score '7 4 18       '$ Flowsheet Row ED from 10/24/2021 in [facility]PT Most recent reading at 10/24/2021 10:50 PM ED from 10/24/2021 in [facility] Most recent reading at 10/24/2021 11:07 AM ED from 10/23/2021 in [facility] Most recent reading at 10/23/2021  7:11  PM  C-SSRS RISK CATEGORY No Risk Error: Q3, 4, or 5 should not be populated when Q2 is No No Risk        Total Time spent with patient: 30 minutes  Musculoskeletal  Strength & Muscle Tone: within normal limits Gait & Station: normal Patient leans: N/A  Psychiatric Specialty Exam  Presentation General Appearance: Casual  Eye  Contact:Fair  Speech:Clear and Coherent  Speech Volume:Normal  Handedness:Right   Mood and Affect  Mood:Dysphoric  Affect:Congruent   Thought Process  Thought Processes:Linear; Coherent  Descriptions of Associations:Intact  Orientation:Full (Time, Place and Person)  Thought Content:Logical  Diagnosis of Schizophrenia or Schizoaffective disorder in past: Yes  Duration of Psychotic Symptoms: Greater than six months  Hallucinations:Hallucinations: None  Ideas of Reference:None  Suicidal Thoughts:Suicidal Thoughts: Yes, Active  Homicidal Thoughts:Homicidal Thoughts: No   Sensorium  Memory:Immediate Fair; Recent Fair; Remote Fair  Judgment:Poor  Insight:Shallow   Executive Functions  Concentration:Fair  Attention Span:Fair  [place]   Psychomotor Activity  Psychomotor Activity:Psychomotor Activity: Normal   Assets  Assets:Communication Skills; Desire for Improvement; Social Support; Leisure Time; Physical Health   Sleep  Sleep:Sleep: Fair   Nutritional Assessment (For OBS and FBC admissions only) Has the patient had a weight loss or gain of 10 pounds or more in the last 3 months?: No Has the patient had a decrease in food intake/or appetite?: No Does the patient have dental problems?: No Does the patient have eating habits or behaviors that may be indicators of an eating disorder including binging or inducing vomiting?: No Has the patient recently lost weight without trying?: 0 Has the patient been eating poorly because of a decreased appetite?: 0 Malnutrition Screening Tool Score: 0    Physical Exam HENT:     Head: Normocephalic.     Nose: Nose normal.  Eyes:     Conjunctiva/sclera: Conjunctivae normal.  Cardiovascular:     Rate and Rhythm: Normal rate.  Pulmonary:     Effort: Pulmonary effort is normal.  Musculoskeletal:        General: Normal range of motion.     Cervical back: Normal  range of motion.  Neurological:     Mental Status: She is alert and oriented to person, place, and time.    Review of Systems  Constitutional: Negative.   HENT: Negative.    Eyes: Negative.   Respiratory: Negative.    Cardiovascular: Negative.   Gastrointestinal: Negative.   Genitourinary: Negative.   Musculoskeletal: Negative.   Skin: Negative.   Neurological: Negative.   Endo/Heme/Allergies: Negative.     Blood pressure 139/72, pulse 98, temperature 98.6 F (37 C), temperature source Oral, resp. rate 20, last menstrual period 09/29/2021, SpO2 94 %. There is no height or weight on file to calculate BMI.  Past Psychiatric History: hx significant for Bipolar disorder, Schizophrenia, Depression, Anxiety, suicide attempts, and multiple ED and inpatient psychiatric hospitalizations.   Is the patient at risk to self? Yes  Has the patient been a risk to self in the past 6 months? Yes .    Has the patient been a risk to self within the distant past? Yes   Is the patient a risk to others? No   Has the patient been a risk to others in the past 6 months? No   Has the patient been a risk to others within the distant past? No   Past Medical History:  Past Medical History:  Diagnosis Date   Anxiety  Bipolar 1 disorder ([place])    Cognitive deficits    Depression    Diabetes mellitus without complication ([place])    Hypertension    Mental disorder    Mental health disorder    Obesity     Past Surgical History:  Procedure Laterality Date   CESAREAN SECTION     CESAREAN SECTION N/A 04/25/2013   Procedure: REPEAT CESAREAN SECTION;  Surgeon: [name], MD;  Location: [facility];  Service: Obstetrics;  Laterality: N/A;   MASS EXCISION N/A 06/03/2012   Procedure: EXCISION MASS;  Surgeon: [name], MD;  Location: [facility];  Service: ENT;  Laterality: N/A;  Excision uvula mass   TONSILLECTOMY N/A 06/03/2012   Procedure: TONSILLECTOMY;  Surgeon: [name], MD;   Location: [facility];  Service: ENT;  Laterality: N/A;   TONSILLECTOMY      Family History:  Family History  Problem Relation Age of Onset   Hypertension Mother    Diabetes Father     Social History:  Social History   Socioeconomic History   Marital status: Single    Spouse name: Not on file   Number of children: Not on file   Years of education: Not on file   Highest education level: Not on file  Occupational History   Not on file  Tobacco Use   Smoking status: Every Day    Types: Cigars   Smokeless tobacco: Never   Tobacco comments:    Pt declined  Vaping Use   Vaping Use: Never used  Substance and Sexual Activity   Alcohol use: Not Currently   Drug use: Not Currently    Types: "Crack" cocaine, Other-see comments    Comment: Patient reports hx of smoking Crack   Sexual activity: Not Currently    Birth control/protection: None    Comment: occasionally  Other Topics Concern   Not on file  Social History Narrative   ** Merged History Encounter **       Social Determinants of Health   Financial Resource Strain: Not on file  Food Insecurity: Not on file  Transportation Needs: Not on file  Physical Activity: Not on file  Stress: Not on file  Social Connections: Not on file  Intimate Partner Violence: Not on file    SDOH:  SDOH Screenings   Alcohol Screen: Low Risk  (05/02/2020)   Alcohol Screen    Last Alcohol Screening Score (AUDIT): 3  Depression (PHQ2-9): Medium Risk (10/24/2021)   Depression (PHQ2-9)    PHQ-2 Score: 7  Financial Resource Strain: Not on file  Food Insecurity: Not on file  Housing: Not on file  Physical Activity: Not on file  Social Connections: Not on file  Stress: Not on file  Tobacco Use: High Risk (10/24/2021)   Patient History    Smoking Tobacco Use: Every Day    Smokeless Tobacco Use: Never    Passive Exposure: Not on file  Transportation Needs: Not on file    Last Labs:  Admission on 10/22/2021, Discharged  on 10/22/2021  Component Date Value Ref Range Status   WBC 10/22/2021 12.7 (H)  4.0 - 10.5 K/uL Final   RBC 10/22/2021 4.71  3.87 - 5.11 MIL/uL Final   Hemoglobin 10/22/2021 11.9 (L)  12.0 - 15.0 g/dL Final   HCT 10/22/2021 38.2  36.0 - 46.0 % Final   MCV 10/22/2021 81.1  80.0 - 100.0 fL Final   MCH 10/22/2021 25.3 (L)  26.0 - 34.0 pg Final  MCHC 10/22/2021 31.2  30.0 - 36.0 g/dL Final   RDW 10/22/2021 15.4  11.5 - 15.5 % Final   Platelets 10/22/2021 279  150 - 400 K/uL Final   nRBC 10/22/2021 0.0  0.0 - 0.2 % Final   Neutrophils Relative % 10/22/2021 59  % Final   Neutro Abs 10/22/2021 7.7  1.7 - 7.7 K/uL Final   Lymphocytes Relative 10/22/2021 28  % Final   Lymphs Abs 10/22/2021 3.5  0.7 - 4.0 K/uL Final   Monocytes Relative 10/22/2021 6  % Final   Monocytes Absolute 10/22/2021 0.7  0.1 - 1.0 K/uL Final   Eosinophils Relative 10/22/2021 6  % Final   Eosinophils Absolute 10/22/2021 0.7 (H)  0.0 - 0.5 K/uL Final   Basophils Relative 10/22/2021 1  % Final   Basophils Absolute 10/22/2021 0.1  0.0 - 0.1 K/uL Final   Immature Granulocytes 10/22/2021 0  % Final   Abs Immature Granulocytes 10/22/2021 0.03  0.00 - 0.07 K/uL Final   Performed at [facility], [place] [address], [place], [place] [address]  Admission on 10/20/2021, Discharged on 10/20/2021  Component Date Value Ref Range Status   WBC 10/20/2021 10.7 (H)  4.0 - 10.5 K/uL Final   RBC 10/20/2021 4.44  3.87 - 5.11 MIL/uL Final   Hemoglobin 10/20/2021 11.2 (L)  12.0 - 15.0 g/dL Final   HCT 10/20/2021 36.0  36.0 - 46.0 % Final   MCV 10/20/2021 81.1  80.0 - 100.0 fL Final   MCH 10/20/2021 25.2 (L)  26.0 - 34.0 pg Final   MCHC 10/20/2021 31.1  30.0 - 36.0 g/dL Final   RDW 10/20/2021 15.3  11.5 - 15.5 % Final   Platelets 10/20/2021 260  150 - 400 K/uL Final   nRBC 10/20/2021 0.0  0.0 - 0.2 % Final   Neutrophils Relative % 10/20/2021 65  % Final   Neutro Abs 10/20/2021 6.9  1.7 - 7.7 K/uL Final   Lymphocytes  Relative 10/20/2021 24  % Final   Lymphs Abs 10/20/2021 2.6  0.7 - 4.0 K/uL Final   Monocytes Relative 10/20/2021 5  % Final   Monocytes Absolute 10/20/2021 0.5  0.1 - 1.0 K/uL Final   Eosinophils Relative 10/20/2021 5  % Final   Eosinophils Absolute 10/20/2021 0.6 (H)  0.0 - 0.5 K/uL Final   Basophils Relative 10/20/2021 1  % Final   Basophils Absolute 10/20/2021 0.1  0.0 - 0.1 K/uL Final   Immature Granulocytes 10/20/2021 0  % Final   Abs Immature Granulocytes 10/20/2021 0.04  0.00 - 0.07 K/uL Final   Performed at [facility], [place] [address], [place], [place] [address]   I-stat hCG, quantitative 10/20/2021 <5.0  <5 mIU/mL Final   Comment 3 10/20/2021          Final   Comment:   GEST. AGE      CONC.  (mIU/mL)   <=1 WEEK        5 - 50     2 WEEKS       50 - 500     3 WEEKS       100 - 10,000     4 WEEKS     1,000 - 30,000        FEMALE AND NON-PREGNANT FEMALE:     LESS THAN 5 mIU/mL    Sodium 10/20/2021 141  135 - 145 mmol/L Final   Potassium 10/20/2021 3.7  3.5 - 5.1 mmol/L Final  Chloride 10/20/2021 110  98 - 111 mmol/L Final   CO2 10/20/2021 28  22 - 32 mmol/L Final   Glucose, Bld 10/20/2021 139 (H)  70 - 99 mg/dL Final   Glucose reference range applies only to samples taken after fasting for at least 8 hours.   BUN 10/20/2021 10  6 - 20 mg/dL Final   Creatinine, Ser 10/20/2021 0.82  0.44 - 1.00 mg/dL Final   Calcium 10/20/2021 9.1  8.9 - 10.3 mg/dL Final   Total Protein 10/20/2021 7.5  6.5 - 8.1 g/dL Final   Albumin 10/20/2021 4.1  3.5 - 5.0 g/dL Final   AST 10/20/2021 22  15 - 41 U/L Final   ALT 10/20/2021 24  0 - 44 U/L Final   Alkaline Phosphatase 10/20/2021 48  38 - 126 U/L Final   Total Bilirubin 10/20/2021 0.5  0.3 - 1.2 mg/dL Final   GFR, Estimated 10/20/2021 >60  >60 mL/min Final   Comment: (NOTE) Calculated using the CKD-EPI Creatinine Equation (2021)    Anion gap 10/20/2021 3 (L)  5 - 15 Final   Performed at [facility],  [place] [address] [place], [place] [address]  Admission on 10/13/2021, Discharged on 10/14/2021  Component Date Value Ref Range Status   Sodium 10/13/2021 141  135 - 145 mmol/L Final   Potassium 10/13/2021 3.8  3.5 - 5.1 mmol/L Final   Chloride 10/13/2021 110  98 - 111 mmol/L Final   CO2 10/13/2021 27  22 - 32 mmol/L Final   Glucose, Bld 10/13/2021 95  70 - 99 mg/dL Final   Glucose reference range applies only to samples taken after fasting for at least 8 hours.   BUN 10/13/2021 12  6 - 20 mg/dL Final   Creatinine, Ser 10/13/2021 0.79  0.44 - 1.00 mg/dL Final   Calcium 10/13/2021 9.2  8.9 - 10.3 mg/dL Final   Total Protein 10/13/2021 7.9  6.5 - 8.1 g/dL Final   Albumin 10/13/2021 4.2  3.5 - 5.0 g/dL Final   AST 10/13/2021 24  15 - 41 U/L Final   ALT 10/13/2021 24  0 - 44 U/L Final   Alkaline Phosphatase 10/13/2021 52  38 - 126 U/L Final   Total Bilirubin 10/13/2021 0.5  0.3 - 1.2 mg/dL Final   GFR, Estimated 10/13/2021 >60  >60 mL/min Final   Comment: (NOTE) Calculated using the CKD-EPI Creatinine Equation (2021)    Anion gap 10/13/2021 4 (L)  5 - 15 Final   Performed at [facility], [place] [address] [place], [place] [address]   Alcohol, Ethyl (B) 10/13/2021 <10  <10 mg/dL Final   Comment: (NOTE) Lowest detectable limit for serum alcohol is 10 mg/dL.  For medical purposes only. Performed at [facility], [place] [address] [place], [place] [address]    Salicylate Lvl 31/51/7616 <7.0 (L)  7.0 - 30.0 mg/dL Final   Performed at [place] [address] [place], [place] [address]   Acetaminophen (Tylenol), Serum 10/13/2021 <10 (L)  10 - 30 ug/mL Final   Comment: (NOTE) Therapeutic concentrations vary significantly. A range of 10-30 ug/mL  may be an effective concentration for many patients. However, some  are best treated at concentrations outside of this range. Acetaminophen concentrations >150 ug/mL at 4 hours after  ingestion  and >50 ug/mL at 12 hours after ingestion are often associated with  toxic reactions.  Performed at [facility], [address] [place], [place]  [address]    WBC 10/13/2021 14.6 (H)  4.0 - 10.5 K/uL Final   RBC 10/13/2021 4.27  3.87 - 5.11 MIL/uL Final   Hemoglobin 10/13/2021 10.9 (L)  12.0 - 15.0 g/dL Final   HCT 10/13/2021 34.8 (L)  36.0 - 46.0 % Final   MCV 10/13/2021 81.5  80.0 - 100.0 fL Final   MCH 10/13/2021 25.5 (L)  26.0 - 34.0 pg Final   MCHC 10/13/2021 31.3  30.0 - 36.0 g/dL Final   RDW 10/13/2021 15.0  11.5 - 15.5 % Final   Platelets 10/13/2021 304  150 - 400 K/uL Final   nRBC 10/13/2021 0.0  0.0 - 0.2 % Final   Performed at [facility], [place] [address] [place], [place] [address]   Opiates 10/14/2021 NONE DETECTED  NONE DETECTED Final   Cocaine 10/14/2021 NONE DETECTED  NONE DETECTED Final   Benzodiazepines 10/14/2021 NONE DETECTED  NONE DETECTED Final   Amphetamines 10/14/2021 NONE DETECTED  NONE DETECTED Final   Tetrahydrocannabinol 10/14/2021 NONE DETECTED  NONE DETECTED Final   Barbiturates 10/14/2021 POSITIVE (A)  NONE DETECTED Final   Comment: (NOTE) DRUG SCREEN FOR MEDICAL PURPOSES ONLY.  IF CONFIRMATION IS NEEDED FOR ANY PURPOSE, NOTIFY LAB WITHIN 5 DAYS.  LOWEST DETECTABLE LIMITS FOR URINE DRUG SCREEN Drug Class                     Cutoff (ng/mL) Amphetamine and metabolites    1000 Barbiturate and metabolites    200 Benzodiazepine                 009 Tricyclics and metabolites     300 Opiates and metabolites        300 Cocaine and metabolites        300 THC                            50 Performed at [facility],  [place] [address], [place], [place] [address]    I-stat hCG, quantitative 10/13/2021 <5.0  <5 mIU/mL Final   Comment 3 10/13/2021          Final   Comment:   GEST. AGE      CONC.  (mIU/mL)   <=1 WEEK        5 - 50     2 WEEKS       50 - 500     3 WEEKS       100 - 10,000      4 WEEKS     1,000 - 30,000        FEMALE AND NON-PREGNANT FEMALE:     LESS THAN 5 mIU/mL    Lithium Lvl 10/13/2021 0.73  0.60 - 1.20 mmol/L Final   Performed at [facility], [place] [address], [place], [place] [address]   Valproic Acid Lvl 10/13/2021 <20 (L)  50.0 - 100.0 ug/mL Final   Comment: RESULTS CONFIRMED BY MANUAL DILUTION LIPEMIC SPECIMEN, RESULTS MAY BE AFFECTED. Performed at [facility], [place] [address] [place], [place] [address]    SARS Coronavirus 2 by RT PCR 10/13/2021 NEGATIVE  NEGATIVE Final   Comment: (NOTE) SARS-CoV-2 target nucleic acids are NOT DETECTED.  The SARS-CoV-2 RNA is generally detectable in upper respiratory specimens during the acute phase of infection. The lowest concentration of SARS-CoV-2 viral copies this assay can detect is 138 copies/mL. A negative result does not preclude  SARS-Cov-2 infection and should not be used as the sole basis for treatment or other patient management decisions. A negative result may occur with  improper specimen collection/handling, submission of specimen other than nasopharyngeal swab, presence of viral mutation(s) within the areas targeted by this assay, and inadequate number of viral copies(<138 copies/mL). A negative result must be combined with clinical observations, patient history, and epidemiological information. The expected result is Negative.  Fact Sheet for Patients:  EntrepreneurPulse.com.au  Fact Sheet for Healthcare Providers:  IncredibleEmployment.be  This test is no                          t yet approved or cleared by the [place] FDA and  has been authorized for detection and/or diagnosis of SARS-CoV-2 by FDA under an Emergency Use Authorization (EUA). This EUA will remain  in effect (meaning this test can be used) for the duration of the COVID-19 declaration under Section 564(b)(1) of the Act, 21 U.S.C.section  360bbb-3(b)(1), unless the authorization is terminated  or revoked sooner.       Influenza A by PCR 10/13/2021 NEGATIVE  NEGATIVE Final   Influenza B by PCR 10/13/2021 NEGATIVE  NEGATIVE Final   Comment: (NOTE) The Xpert Xpress SARS-CoV-2/FLU/RSV plus assay is intended as an aid in the diagnosis of influenza from Nasopharyngeal swab specimens and should not be used as a sole basis for treatment. Nasal washings and aspirates are unacceptable for Xpert Xpress SARS-CoV-2/FLU/RSV testing.  Fact Sheet for Patients: EntrepreneurPulse.com.au  Fact Sheet for Healthcare Providers: IncredibleEmployment.be  This test is not yet approved or cleared by the [place] FDA and has been authorized for detection and/or diagnosis of SARS-CoV-2 by FDA under an Emergency Use Authorization (EUA). This EUA will remain in effect (meaning this test can be used) for the duration of the COVID-19 declaration under Section 564(b)(1) of the Act, 21 U.S.C. section 360bbb-3(b)(1), unless the authorization is terminated or revoked.  Performed at [facility], [place] [address], [place], [place] [address]    Glucose-Capillary 10/14/2021 126 (H)  70 - 99 mg/dL Final   Glucose reference range applies only to samples taken after fasting for at least 8 hours.  Admission on 10/11/2021, Discharged on 10/12/2021  Component Date Value Ref Range Status   SARS Coronavirus 2 by RT PCR 10/11/2021 NEGATIVE  NEGATIVE Final   Comment: (NOTE) SARS-CoV-2 target nucleic acids are NOT DETECTED.  The SARS-CoV-2 RNA is generally detectable in upper respiratory specimens during the acute phase of infection. The lowest concentration of SARS-CoV-2 viral copies this assay can detect is 138 copies/mL. A negative result does not preclude SARS-Cov-2 infection and should not be used as the sole basis for treatment or other patient management decisions. A negative result may occur  with  improper specimen collection/handling, submission of specimen other than nasopharyngeal swab, presence of viral mutation(s) within the areas targeted by this assay, and inadequate number of viral copies(<138 copies/mL). A negative result must be combined with clinical observations, patient history, and epidemiological information. The expected result is Negative.  Fact Sheet for Patients:  EntrepreneurPulse.com.au  Fact Sheet for Healthcare Providers:  IncredibleEmployment.be  This test is no                          t yet approved or cleared by the [place] FDA and  has been authorized for detection and/or diagnosis of SARS-CoV-2 by FDA under an  Emergency Use Authorization (EUA). This EUA will remain  in effect (meaning this test can be used) for the duration of the COVID-19 declaration under Section 564(b)(1) of the Act, 21 U.S.C.section 360bbb-3(b)(1), unless the authorization is terminated  or revoked sooner.       Influenza A by PCR 10/11/2021 NEGATIVE  NEGATIVE Final   Influenza B by PCR 10/11/2021 NEGATIVE  NEGATIVE Final   Comment: (NOTE) The Xpert Xpress SARS-CoV-2/FLU/RSV plus assay is intended as an aid in the diagnosis of influenza from Nasopharyngeal swab specimens and should not be used as a sole basis for treatment. Nasal washings and aspirates are unacceptable for Xpert Xpress SARS-CoV-2/FLU/RSV testing.  Fact Sheet for Patients: EntrepreneurPulse.com.au  Fact Sheet for Healthcare Providers: IncredibleEmployment.be  This test is not yet approved or cleared by the [place] FDA and has been authorized for detection and/or diagnosis of SARS-CoV-2 by FDA under an Emergency Use Authorization (EUA). This EUA will remain in effect (meaning this test can be used) for the duration of the COVID-19 declaration under Section 564(b)(1) of the Act, 21 U.S.C. section 360bbb-3(b)(1),  unless the authorization is terminated or revoked.  Performed at [facility], [place] [address], [place], [place] [address]    WBC 10/12/2021 9.6  4.0 - 10.5 K/uL Final   RBC 10/12/2021 4.27  3.87 - 5.11 MIL/uL Final   Hemoglobin 10/12/2021 10.8 (L)  12.0 - 15.0 g/dL Final   HCT 10/12/2021 34.3 (L)  36.0 - 46.0 % Final   MCV 10/12/2021 80.3  80.0 - 100.0 fL Final   MCH 10/12/2021 25.3 (L)  26.0 - 34.0 pg Final   MCHC 10/12/2021 31.5  30.0 - 36.0 g/dL Final   RDW 10/12/2021 14.8  11.5 - 15.5 % Final   Platelets 10/12/2021 233  150 - 400 K/uL Final   nRBC 10/12/2021 0.0  0.0 - 0.2 % Final   Neutrophils Relative % 10/12/2021 54  % Final   Neutro Abs 10/12/2021 5.3  1.7 - 7.7 K/uL Final   Lymphocytes Relative 10/12/2021 34  % Final   Lymphs Abs 10/12/2021 3.3  0.7 - 4.0 K/uL Final   Monocytes Relative 10/12/2021 6  % Final   Monocytes Absolute 10/12/2021 0.5  0.1 - 1.0 K/uL Final   Eosinophils Relative 10/12/2021 5  % Final   Eosinophils Absolute 10/12/2021 0.4  0.0 - 0.5 K/uL Final   Basophils Relative 10/12/2021 1  % Final   Basophils Absolute 10/12/2021 0.1  0.0 - 0.1 K/uL Final   Immature Granulocytes 10/12/2021 0  % Final   Abs Immature Granulocytes 10/12/2021 0.04  0.00 - 0.07 K/uL Final   Performed at [facility], [place] [address] [address] [place], [place] [address]   Sodium 10/12/2021 141  135 - 145 mmol/L Final   Potassium 10/12/2021 3.6  3.5 - 5.1 mmol/L Final   Chloride 10/12/2021 105  98 - 111 mmol/L Final   CO2 10/12/2021 27  22 - 32 mmol/L Final   Glucose, Bld 10/12/2021 156 (H)  70 - 99 mg/dL Final   Glucose reference range applies only to samples taken after fasting for at least 8 hours.   BUN 10/12/2021 14  6 - 20 mg/dL Final   Creatinine, Ser 10/12/2021 0.88  0.44 - 1.00 mg/dL Final   Calcium 10/12/2021 9.2  8.9 - 10.3 mg/dL Final   Total Protein 10/12/2021 6.6  6.5 - 8.1 g/dL Final   Albumin 10/12/2021 3.5  3.5 - 5.0 g/dL Final  AST 10/12/2021 19  15 - 41 U/L  Final   ALT 10/12/2021 25  0 - 44 U/L Final   Alkaline Phosphatase 10/12/2021 48  38 - 126 U/L Final   Total Bilirubin 10/12/2021 0.4  0.3 - 1.2 mg/dL Final   GFR, Estimated 10/12/2021 >60  >60 mL/min Final   Comment: (NOTE) Calculated using the CKD-EPI Creatinine Equation (2021)    Anion gap 10/12/2021 9  5 - 15 Final   Performed at [facility], [place] [address], [place], [place] [address]   POC Amphetamine UR 10/11/2021 None Detected  NONE DETECTED (Cut Off Level 1000 ng/mL) Final   POC Secobarbital (BAR) 10/11/2021 Positive (A)  NONE DETECTED (Cut Off Level 300 ng/mL) Final   POC Buprenorphine (BUP) 10/11/2021 None Detected  NONE DETECTED (Cut Off Level 10 ng/mL) Final   POC Oxazepam (BZO) 10/11/2021 None Detected  NONE DETECTED (Cut Off Level 300 ng/mL) Final   POC Cocaine UR 10/11/2021 None Detected  NONE DETECTED (Cut Off Level 300 ng/mL) Final   POC Methamphetamine UR 10/11/2021 None Detected  NONE DETECTED (Cut Off Level 1000 ng/mL) Final   POC Morphine 10/11/2021 None Detected  NONE DETECTED (Cut Off Level 300 ng/mL) Final   POC Methadone UR 10/11/2021 None Detected  NONE DETECTED (Cut Off Level 300 ng/mL) Final   POC Oxycodone UR 10/11/2021 None Detected  NONE DETECTED (Cut Off Level 100 ng/mL) Final   POC Marijuana UR 10/11/2021 None Detected  NONE DETECTED (Cut Off Level 50 ng/mL) Final   Lithium Lvl 10/12/2021 0.75  0.60 - 1.20 mmol/L Final   Performed at [facility], [place] [name], [place], [place] [address]   SARSCOV2ONAVIRUS 2 AG 10/11/2021 NEGATIVE  NEGATIVE Final   Comment: (NOTE) SARS-CoV-2 antigen NOT DETECTED.   Negative results are presumptive.  Negative results do not preclude SARS-CoV-2 infection and should not be used as the sole basis for treatment or other patient management decisions, including infection  control decisions, particularly in the presence of clinical signs and  symptoms consistent with COVID-19, or in those who have been in contact  with the virus.  Negative results must be combined with clinical observations, patient history, and epidemiological information. The expected result is Negative.  Fact Sheet for Patients: HandmadeRecipes.com.cy  Fact Sheet for Healthcare Providers: FuneralLife.at  This test is not yet approved or cleared by the [place] FDA and  has been authorized for detection and/or diagnosis of SARS-CoV-2 by FDA under an Emergency Use Authorization (EUA).  This EUA will remain in effect (meaning this test can be used) for the duration of  the COV                          ID-19 declaration under Section 564(b)(1) of the Act, 21 U.S.C. section 360bbb-3(b)(1), unless the authorization is terminated or revoked sooner.     Glucose-Capillary 10/11/2021 123 (H)  70 - 99 mg/dL Final   Glucose reference range applies only to samples taken after fasting for at least 8 hours.   Preg Test, Ur 10/11/2021 NEGATIVE  NEGATIVE Final   Comment:        THE SENSITIVITY OF THIS METHODOLOGY IS >24 mIU/mL    Glucose-Capillary 10/12/2021 229 (H)  70 - 99 mg/dL Final   Glucose reference range applies only to samples taken after fasting for at least 8 hours.  Admission on 09/28/2021, Discharged on 09/29/2021  Component Date Value Ref Range Status  SARS Coronavirus 2 by RT PCR 09/28/2021 NEGATIVE  NEGATIVE Final   Comment: (NOTE) SARS-CoV-2 target nucleic acids are NOT DETECTED.  The SARS-CoV-2 RNA is generally detectable in upper respiratory specimens during the acute phase of infection. The lowest concentration of SARS-CoV-2 viral copies this assay can detect is 138 copies/mL. A negative result does not preclude SARS-Cov-2 infection and should not be used as the sole basis for treatment or other patient management decisions. A negative result may occur with  improper specimen collection/handling, submission of specimen other than nasopharyngeal swab, presence of  viral mutation(s) within the areas targeted by this assay, and inadequate number of viral copies(<138 copies/mL). A negative result must be combined with clinical observations, patient history, and epidemiological information. The expected result is Negative.  Fact Sheet for Patients:  EntrepreneurPulse.com.au  Fact Sheet for Healthcare Providers:  IncredibleEmployment.be  This test is no                          t yet approved or cleared by the [place] FDA and  has been authorized for detection and/or diagnosis of SARS-CoV-2 by FDA under an Emergency Use Authorization (EUA). This EUA will remain  in effect (meaning this test can be used) for the duration of the COVID-19 declaration under Section 564(b)(1) of the Act, 21 U.S.C.section 360bbb-3(b)(1), unless the authorization is terminated  or revoked sooner.       Influenza A by PCR 09/28/2021 NEGATIVE  NEGATIVE Final   Influenza B by PCR 09/28/2021 NEGATIVE  NEGATIVE Final   Comment: (NOTE) The Xpert Xpress SARS-CoV-2/FLU/RSV plus assay is intended as an aid in the diagnosis of influenza from Nasopharyngeal swab specimens and should not be used as a sole basis for treatment. Nasal washings and aspirates are unacceptable for Xpert Xpress SARS-CoV-2/FLU/RSV testing.  Fact Sheet for Patients: EntrepreneurPulse.com.au  Fact Sheet for Healthcare Providers: IncredibleEmployment.be  This test is not yet approved or cleared by the [place] FDA and has been authorized for detection and/or diagnosis of SARS-CoV-2 by FDA under an Emergency Use Authorization (EUA). This EUA will remain in effect (meaning this test can be used) for the duration of the COVID-19 declaration under Section 564(b)(1) of the Act, 21 U.S.C. section 360bbb-3(b)(1), unless the authorization is terminated or revoked.  Performed at [facility], [place] [address],  [place], [place] [address]    WBC 09/28/2021 9.8  4.0 - 10.5 K/uL Final   RBC 09/28/2021 3.90  3.87 - 5.11 MIL/uL Final   Hemoglobin 09/28/2021 10.0 (L)  12.0 - 15.0 g/dL Final   HCT 09/28/2021 31.1 (L)  36.0 - 46.0 % Final   MCV 09/28/2021 79.7 (L)  80.0 - 100.0 fL Final   MCH 09/28/2021 25.6 (L)  26.0 - 34.0 pg Final   MCHC 09/28/2021 32.2  30.0 - 36.0 g/dL Final   RDW 09/28/2021 15.6 (H)  11.5 - 15.5 % Final   Platelets 09/28/2021 211  150 - 400 K/uL Final   REPEATED TO VERIFY   nRBC 09/28/2021 0.0  0.0 - 0.2 % Final   Neutrophils Relative % 09/28/2021 52  % Final   Neutro Abs 09/28/2021 5.2  1.7 - 7.7 K/uL Final   Lymphocytes Relative 09/28/2021 36  % Final   Lymphs Abs 09/28/2021 3.5  0.7 - 4.0 K/uL Final   Monocytes Relative 09/28/2021 7  % Final   Monocytes Absolute 09/28/2021 0.7  0.1 - 1.0 K/uL Final   Eosinophils  Relative 09/28/2021 4  % Final   Eosinophils Absolute 09/28/2021 0.4  0.0 - 0.5 K/uL Final   Basophils Relative 09/28/2021 1  % Final   Basophils Absolute 09/28/2021 0.1  0.0 - 0.1 K/uL Final   Immature Granulocytes 09/28/2021 0  % Final   Abs Immature Granulocytes 09/28/2021 0.03  0.00 - 0.07 K/uL Final   Performed at [facility], [place] [address], [place], [place] [address]   Sodium 09/28/2021 139  135 - 145 mmol/L Final   Potassium 09/28/2021 3.8  3.5 - 5.1 mmol/L Final   Chloride 09/28/2021 106  98 - 111 mmol/L Final   CO2 09/28/2021 23  22 - 32 mmol/L Final   Glucose, Bld 09/28/2021 113 (H)  70 - 99 mg/dL Final   Glucose reference range applies only to samples taken after fasting for at least 8 hours.   BUN 09/28/2021 6  6 - 20 mg/dL Final   Creatinine, Ser 09/28/2021 0.86  0.44 - 1.00 mg/dL Final   Calcium 09/28/2021 9.4  8.9 - 10.3 mg/dL Final   Total Protein 09/28/2021 6.9  6.5 - 8.1 g/dL Final   Albumin 09/28/2021 3.7  3.5 - 5.0 g/dL Final   AST 09/28/2021 20  15 - 41 U/L Final   ALT 09/28/2021 15  0 - 44 U/L Final   Alkaline Phosphatase 09/28/2021  51  38 - 126 U/L Final   Total Bilirubin 09/28/2021 0.3  0.3 - 1.2 mg/dL Final   GFR, Estimated 09/28/2021 >60  >60 mL/min Final   Comment: (NOTE) Calculated using the CKD-EPI Creatinine Equation (2021)    Anion gap 09/28/2021 10  5 - 15 Final   Performed at [facility] [address], [place], [place] [address]   Hgb A1c MFr Bld 09/28/2021 5.7 (H)  4.8 - 5.6 % Final   Comment: (NOTE) Pre diabetes:          5.7%-6.4%  Diabetes:              >6.4%  Glycemic control for   <7.0% adults with diabetes    Mean Plasma Glucose 09/28/2021 116.89  mg/dL Final   Performed at [facility], [place] [address] [place], [place] [address]   Alcohol, Ethyl (B) 09/28/2021 <10  <10 mg/dL Final   Comment: (NOTE) Lowest detectable limit for serum alcohol is 10 mg/dL.  For medical purposes only. Performed at [facility], [place] [address] [place], [place] [address]    Cholesterol 09/28/2021 159  0 - 200 mg/dL Final   Triglycerides 09/28/2021 91  <150 mg/dL Final   HDL 09/28/2021 51  >40 mg/dL Final   Total CHOL/HDL Ratio 09/28/2021 3.1  RATIO Final   VLDL 09/28/2021 18  0 - 40 mg/dL Final   LDL Cholesterol 09/28/2021 90  0 - 99 mg/dL Final   Comment:        Total Cholesterol/HDL:CHD Risk Coronary Heart Disease Risk Table                     Men   Women  1/2 Average Risk   3.4   3.3  Average Risk       5.0   4.4  2 X Average Risk   9.6   7.1  3 X Average Risk  23.4   11.0        Use the calculated Patient Ratio above and the CHD Risk Table to determine the patient's CHD Risk.  ATP III CLASSIFICATION (LDL):  <100     mg/dL   Optimal  100-129  mg/dL   Near or Above                    Optimal  130-159  mg/dL   Borderline  160-189  mg/dL   High  >190     mg/dL   Very High Performed at [facility] [address] [place], [place] [address]    TSH 09/28/2021 2.079  0.350 - 4.500 uIU/mL Final   Comment: Performed by a 3rd Generation assay with a functional  sensitivity of <=0.01 uIU/mL. Performed at [facility], [place] [address] [place], [place] [address]    POC Amphetamine UR 09/28/2021 None Detected  NONE DETECTED (Cut Off Level 1000 ng/mL) Final   POC Secobarbital (BAR) 09/28/2021 Positive (A)  NONE DETECTED (Cut Off Level 300 ng/mL) Final   POC Buprenorphine (BUP) 09/28/2021 None Detected  NONE DETECTED (Cut Off Level 10 ng/mL) Final   POC Oxazepam (BZO) 09/28/2021 None Detected  NONE DETECTED (Cut Off Level 300 ng/mL) Final   POC Cocaine UR 09/28/2021 None Detected  NONE DETECTED (Cut Off Level 300 ng/mL) Final   POC Methamphetamine UR 09/28/2021 None Detected  NONE DETECTED (Cut Off Level 1000 ng/mL) Final   POC Morphine 09/28/2021 None Detected  NONE DETECTED (Cut Off Level 300 ng/mL) Final   POC Methadone UR 09/28/2021 None Detected  NONE DETECTED (Cut Off Level 300 ng/mL) Final   POC Oxycodone UR 09/28/2021 None Detected  NONE DETECTED (Cut Off Level 100 ng/mL) Final   POC Marijuana UR 09/28/2021 None Detected  NONE DETECTED (Cut Off Level 50 ng/mL) Final   Lithium Lvl 09/28/2021 0.41 (L)  0.60 - 1.20 mmol/L Final   Performed at [facility], [place] [address] [place], [place] [address]   SARSCOV2ONAVIRUS 2 AG 09/28/2021 NEGATIVE  NEGATIVE Final   Comment: (NOTE) SARS-CoV-2 antigen NOT DETECTED.   Negative results are presumptive.  Negative results do not preclude SARS-CoV-2 infection and should not be used as the sole basis for treatment or other patient management decisions, including infection  control decisions, particularly in the presence of clinical signs and  symptoms consistent with COVID-19, or in those who have been in contact with the virus.  Negative results must be combined with clinical observations, patient history, and epidemiological information. The expected result is Negative.  Fact Sheet for Patients: HandmadeRecipes.com.cy  Fact Sheet for Healthcare  Providers: FuneralLife.at  This test is not yet approved or cleared by the [place] FDA and  has been authorized for detection and/or diagnosis of SARS-CoV-2 by FDA under an Emergency Use Authorization (EUA).  This EUA will remain in effect (meaning this test can be used) for the duration of  the COV                          ID-19 declaration under Section 564(b)(1) of the Act, 21 U.S.C. section 360bbb-3(b)(1), unless the authorization is terminated or revoked sooner.     Preg Test, Ur 09/28/2021 NEGATIVE  NEGATIVE Final   Comment:        THE SENSITIVITY OF THIS METHODOLOGY IS >24 mIU/mL    Glucose-Capillary 09/29/2021 199 (H)  70 - 99 mg/dL Final   Glucose reference range applies only to samples taken after fasting for at least 8 hours.   Glucose-Capillary 09/29/2021 88  70 - 99 mg/dL Final  Glucose reference range applies only to samples taken after fasting for at least 8 hours.  Admission on 09/11/2021, Discharged on 09/11/2021  Component Date Value Ref Range Status   Glucose-Capillary 09/11/2021 130 (H)  70 - 99 mg/dL Final   Glucose reference range applies only to samples taken after fasting for at least 8 hours.   Comment 1 09/11/2021 Document in Chart   Final  Admission on 08/29/2021, Discharged on 08/29/2021  Component Date Value Ref Range Status   Preg Test, Ur 08/29/2021 NEGATIVE  NEGATIVE Final   Comment:        THE SENSITIVITY OF THIS METHODOLOGY IS >20 mIU/mL. Performed at [facility], [place] [address], [place], [place] [address]    Sodium 08/29/2021 140  135 - 145 mmol/L Final   Potassium 08/29/2021 3.5  3.5 - 5.1 mmol/L Final   Chloride 08/29/2021 103  98 - 111 mmol/L Final   BUN 08/29/2021 5 (L)  6 - 20 mg/dL Final   Creatinine, Ser 08/29/2021 0.70  0.44 - 1.00 mg/dL Final   Glucose, Bld 08/29/2021 113 (H)  70 - 99 mg/dL Final   Glucose reference range applies only to samples taken after fasting for at least 8  hours.   Calcium, Ion 08/29/2021 1.17  1.15 - 1.40 mmol/L Final   TCO2 08/29/2021 28  22 - 32 mmol/L Final   Hemoglobin 08/29/2021 13.3  12.0 - 15.0 g/dL Final   HCT 08/29/2021 39.0  36.0 - 46.0 % Final   I-stat hCG, quantitative 08/29/2021 <5.0  <5 mIU/mL Final   Comment 3 08/29/2021          Final   Comment:   GEST. AGE      CONC.  (mIU/mL)   <=1 WEEK        5 - 50     2 WEEKS       50 - 500     3 WEEKS       100 - 10,000     4 WEEKS     1,000 - 30,000        FEMALE AND NON-PREGNANT FEMALE:     LESS THAN 5 mIU/mL    Total Protein 08/29/2021 8.5 (H)  6.5 - 8.1 g/dL Final   Albumin 08/29/2021 4.5  3.5 - 5.0 g/dL Final   AST 08/29/2021 19  15 - 41 U/L Final   ALT 08/29/2021 16  0 - 44 U/L Final   Alkaline Phosphatase 08/29/2021 59  38 - 126 U/L Final   Total Bilirubin 08/29/2021 0.5  0.3 - 1.2 mg/dL Final   Bilirubin, Direct 08/29/2021 0.1  0.0 - 0.2 mg/dL Final   Indirect Bilirubin 08/29/2021 0.4  0.3 - 0.9 mg/dL Final   Performed at [facility], [place] [address], [place], [place] [address]   WBC 08/29/2021 10.6 (H)  4.0 - 10.5 K/uL Final   RBC 08/29/2021 4.58  3.87 - 5.11 MIL/uL Final   Hemoglobin 08/29/2021 11.7 (L)  12.0 - 15.0 g/dL Final   HCT 08/29/2021 36.7  36.0 - 46.0 % Final   MCV 08/29/2021 80.1  80.0 - 100.0 fL Final   MCH 08/29/2021 25.5 (L)  26.0 - 34.0 pg Final   MCHC 08/29/2021 31.9  30.0 - 36.0 g/dL Final   RDW 08/29/2021 16.5 (H)  11.5 - 15.5 % Final   Platelets 08/29/2021 194  150 - 400 K/uL Final   nRBC 08/29/2021 0.0  0.0 - 0.2 % Final  Neutrophils Relative % 08/29/2021 71  % Final   Neutro Abs 08/29/2021 7.5  1.7 - 7.7 K/uL Final   Lymphocytes Relative 08/29/2021 19  % Final   Lymphs Abs 08/29/2021 2.0  0.7 - 4.0 K/uL Final   Monocytes Relative 08/29/2021 7  % Final   Monocytes Absolute 08/29/2021 0.7  0.1 - 1.0 K/uL Final   Eosinophils Relative 08/29/2021 2  % Final   Eosinophils Absolute 08/29/2021 0.2  0.0 - 0.5 K/uL Final    Basophils Relative 08/29/2021 1  % Final   Basophils Absolute 08/29/2021 0.1  0.0 - 0.1 K/uL Final   Immature Granulocytes 08/29/2021 0  % Final   Abs Immature Granulocytes 08/29/2021 0.03  0.00 - 0.07 K/uL Final   Performed at [facility], [place] [address] [place], [place] [address]   Lithium Lvl 08/29/2021 0.36 (L)  0.60 - 1.20 mmol/L Final   Performed at [facility] [address] [place], [place] [address]   pH, Ven 08/29/2021 7.36  7.25 - 7.43 Final   pCO2, Ven 08/29/2021 53  44 - 60 mmHg Final   pO2, Ven 08/29/2021 <31 (LL)  32 - 45 mmHg Final   Comment: CRITICAL RESULT CALLED TO, READ BACK BY AND VERIFIED WITH: A.WOODY, RN AT 1342 ON 05.05.23 BY N.THOMPSON    Bicarbonate 08/29/2021 29.9 (H)  20.0 - 28.0 mmol/L Final   Acid-Base Excess 08/29/2021 3.5 (H)  0.0 - 2.0 mmol/L Final   O2 Saturation 08/29/2021 53.5  % Final   Patient temperature 08/29/2021 37.0   Final   Performed at [facility], [place] [address] [place], [place] [address]   Alcohol, Ethyl (B) 08/29/2021 <10  <10 mg/dL Final   Comment: (NOTE) Lowest detectable limit for serum alcohol is 10 mg/dL.  For medical purposes only. Performed at [facility], [place] [address], [place], [place] [address]     Allergies: Wellbutrin [bupropion], Omnipaque [iohexol], Contrast media [iodinated contrast media], Depakote [divalproex sodium], Hydroxyzine, and Penicillin g  PTA Medications: (Not in a hospital admission)   Medical Decision Making  Patient admitted to the [facility] continuous assessment unit for overnight observation. Patient is voluntary.   Lab Orders         Resp Panel by RT-PCR (Flu A&B, Covid) Anterior Nasal Swab         CBC with Differential/Platelet         Comprehensive metabolic panel         Ethanol         Pregnancy, urine         Lithium level         POCT Urine Drug Screen - (I-Screen)         POC SARS Coronavirus 2 Ag-ED -  Nasal Swab         CBG monitoring    EKG  Medications: Medications verified by pharmacist.  Per chart review, patient was seen in the ED om 10/23/21 for cellulitis of the left breast and was prescribed doxycyline 100 mg po BID x 7 and ibuprofen 400 mg po as needed. Patient reports taking 1 or 2 doses of the antibiotic yesterday. Will restart doxycyline 100 mg po BID x 6 days and add ibuprofen 400 mg po Q6 hours prn for pain.   -will restart home medication Levemir 40 u SQ BID for DM, pt reports taking at 9 am and evening. Last dose was on 10/24/21 at night.   -will restart  home medication Glimepiride 4 mg po daily for DM   -will restart home medication lithium 450 mg po BID once lithium level results ( currently pending)  -will restart restart home medication Haldol 10 mg po QHS   -will restart home medication Cogentin 0.5 mg po BID   -will restart home medication Norvasc 5 mg po daily   -will restart home metoprolol tartrate 12.5 mg po BID   -will restart home medication simvastatin 20 mg po daily  -will restart home medication vistaril 50 mg po QHS prn for anxiety  Recommendations  Based on my evaluation the patient does not appear to have an emergency medical condition. Dema Severin, Mellody Life, NP 10/25/21  12:49 PM

## 2021-10-25 NOTE — ED Notes (Signed)
Blood draw unsuccessful with prior attempt from another staff member. Patient given fluids and encourage po hydration.

## 2021-10-26 LAB — GLUCOSE, CAPILLARY
Glucose-Capillary: 136 mg/dL — ABNORMAL HIGH (ref 70–99)
Glucose-Capillary: 154 mg/dL — ABNORMAL HIGH (ref 70–99)

## 2021-10-26 MED ORDER — LORAZEPAM 1 MG PO TABS
1.0000 mg | ORAL_TABLET | Freq: Once | ORAL | Status: AC
  Administered 2021-10-26: 1 mg via ORAL
  Filled 2021-10-26: qty 1

## 2021-10-26 MED ORDER — LITHIUM CARBONATE ER 450 MG PO TBCR
450.0000 mg | EXTENDED_RELEASE_TABLET | Freq: Two times a day (BID) | ORAL | Status: DC
  Administered 2021-10-26 – 2021-10-27 (×3): 450 mg via ORAL
  Filled 2021-10-26 (×3): qty 1

## 2021-10-26 MED ORDER — LORAZEPAM 1 MG PO TABS
1.0000 mg | ORAL_TABLET | Freq: Once | ORAL | Status: AC
Start: 2021-10-26 — End: 2021-10-26
  Administered 2021-10-26: 1 mg via ORAL
  Filled 2021-10-26: qty 1

## 2021-10-26 MED ORDER — PANTOPRAZOLE SODIUM 40 MG PO TBEC
40.0000 mg | DELAYED_RELEASE_TABLET | Freq: Every day | ORAL | Status: DC
  Administered 2021-10-26 – 2021-10-27 (×2): 40 mg via ORAL
  Filled 2021-10-26 (×2): qty 1

## 2021-10-26 NOTE — ED Notes (Signed)
Pt was given a muffin and juice for breakfast.  

## 2021-10-26 NOTE — ED Provider Notes (Signed)
Behavioral Health Progress Note  Date and Time: 10/26/2021 8:39 AM Name: [name] MRN:  [id number]  Subjective:  patient presented to [facility] on 10/25/2021 related to an attempted overdose.Marland Kitchen  She was admitted to the continuous assessment for overnight observation.  Upon assessment patient meets criteria for inpatient psychiatric admission.  Social work notified and patient has been faxed out.  [name], 31 y.o., female patient seen face to face by this provider, consulted with Dr. Lovette Cliche; and chart reviewed on 10/26/21.  Per chart review patient has a history of schizoaffective disorder, SI and multiple suicide attempts with significant overdoses.  She has ACT team in place.  On evaluation [name] is observed laying in her bed.  She reports on 6/30 she tried to overdose on her lithium, BuSpar and other psychiatric medications. Lithium level 0.54.  Lithium 450 mg twice daily restarted.  She is alert/oriented x4 and cooperative.  Her speech is clear, coherent, normal rate and tone.  She endorses increased depression with a congruent affect.  She endorses her relationship with her mother as a Psychologist, clinical.  She continues to endorse suicidal ideations with a plan to overdose.  States if she is discharged she will overdose.  She reports a history of 10 previous suicide attempts.  She cannot contract for safety.  She denies HI/AVH at this time.  Objectively she does not appear to be responding to internal/external stimuli.  Discussed inpatient psychiatric admission with patient and she is in agreement.  She is adamant that she does not want to return to [facility] or old [place].  She is requesting to be admitted to [place] and at this time there is no bed availability.      Diagnosis:  Final diagnoses:  Suicidal ideation    Total Time spent with patient: 30 minutes  Past Psychiatric History: See H&P Past Medical History:  Past Medical History:  Diagnosis Date    Anxiety    Bipolar 1 disorder ([place])    Cognitive deficits    Depression    Diabetes mellitus without complication ([place])    Hypertension    Mental disorder    Mental health disorder    Obesity     Past Surgical History:  Procedure Laterality Date   CESAREAN SECTION     CESAREAN SECTION N/A 04/25/2013   Procedure: REPEAT CESAREAN SECTION;  Surgeon: [name], MD;  Location: [facility];  Service: Obstetrics;  Laterality: N/A;   MASS EXCISION N/A 06/03/2012   Procedure: EXCISION MASS;  Surgeon: [name], MD;  Location: [facility];  Service: ENT;  Laterality: N/A;  Excision uvula mass   TONSILLECTOMY N/A 06/03/2012   Procedure: TONSILLECTOMY;  Surgeon: [name], MD;  Location: [facility];  Service: ENT;  Laterality: N/A;   TONSILLECTOMY     Family History:  Family History  Problem Relation Age of Onset   Hypertension Mother    Diabetes Father    Family Psychiatric  History: See H&P Social History:  Social History   Substance and Sexual Activity  Alcohol Use Not Currently     Social History   Substance and Sexual Activity  Drug Use Not Currently   Types: "Crack" cocaine, Other-see comments   Comment: Patient reports hx of smoking Crack    Social History   Socioeconomic History   Marital status: Single    Spouse name: Not on file   Number of children: Not on file   Years  of education: Not on file   Highest education level: Not on file  Occupational History   Not on file  Tobacco Use   Smoking status: Every Day    Types: Cigars   Smokeless tobacco: Never   Tobacco comments:    Pt declined  Vaping Use   Vaping Use: Never used  Substance and Sexual Activity   Alcohol use: Not Currently   Drug use: Not Currently    Types: "Crack" cocaine, Other-see comments    Comment: Patient reports hx of smoking Crack   Sexual activity: Not Currently    Birth control/protection: None    Comment: occasionally  Other Topics Concern    Not on file  Social History Narrative   ** Merged History Encounter **       Social Determinants of Health   Financial Resource Strain: Not on file  Food Insecurity: Not on file  Transportation Needs: Not on file  Physical Activity: Not on file  Stress: Not on file  Social Connections: Not on file   SDOH:  SDOH Screenings   Alcohol Screen: Low Risk  (05/02/2020)   Alcohol Screen    Last Alcohol Screening Score (AUDIT): 3  Depression (PHQ2-9): Medium Risk (10/24/2021)   Depression (PHQ2-9)    PHQ-2 Score: 7  Financial Resource Strain: Not on file  Food Insecurity: Not on file  Housing: Not on file  Physical Activity: Not on file  Social Connections: Not on file  Stress: Not on file  Tobacco Use: High Risk (10/24/2021)   Patient History    Smoking Tobacco Use: Every Day    Smokeless Tobacco Use: Never    Passive Exposure: Not on file  Transportation Needs: Not on file   Additional Social History:                         Sleep: Fair  Appetite:  Fair  Current Medications:  Current Facility-Administered Medications  Medication Dose Route Frequency Provider Last Rate Last Admin   acetaminophen (TYLENOL) tablet 650 mg  650 mg Oral Q6H PRN White, Patrice L, NP       alum & mag hydroxide-simeth (MAALOX/MYLANTA) 200-200-20 MG/5ML suspension 30 mL  30 mL Oral Q4H PRN White, Patrice L, NP       amLODipine (NORVASC) tablet 5 mg  5 mg Oral Daily White, Patrice L, NP       benztropine (COGENTIN) tablet 0.5 mg  0.5 mg Oral BID White, Patrice L, NP   0.5 mg at 10/25/21 2145   doxycycline (VIBRA-TABS) tablet 100 mg  100 mg Oral BID White, Patrice L, NP   100 mg at 10/25/21 2145   glimepiride (AMARYL) tablet 4 mg  4 mg Oral Q breakfast White, Patrice L, NP       haloperidol (HALDOL) tablet 10 mg  10 mg Oral QHS White, Patrice L, NP   10 mg at 10/25/21 2145   hydrOXYzine (VISTARIL) capsule 50 mg  50 mg Oral QHS PRN White, Patrice L, NP       ibuprofen (ADVIL) tablet 400 mg   400 mg Oral Q6H PRN White, Patrice L, NP       insulin detemir (LEVEMIR) injection 40 Units  40 Units Subcutaneous BID White, Patrice L, NP   40 Units at 10/25/21 1658   magnesium hydroxide (MILK OF MAGNESIA) suspension 30 mL  30 mL Oral Daily PRN White, Patrice L, NP       metoprolol tartrate (LOPRESSOR)  tablet 12.5 mg  12.5 mg Oral BID White, Patrice L, NP   12.5 mg at 10/25/21 2146   simvastatin (ZOCOR) tablet 20 mg  20 mg Oral Daily White, Patrice L, NP       Current Outpatient Medications  Medication Sig Dispense Refill   benztropine (COGENTIN) 0.5 MG tablet Take 0.5 mg by mouth 2 (two) times daily.     Clobetasol Propionate 0.05 % shampoo Apply 1 application. topically daily.     guanFACINE (INTUNIV) 1 MG TB24 ER tablet Take 1 mg by mouth at bedtime.     haloperidol (HALDOL) 5 MG tablet Take 5 mg by mouth daily at 6 (six) AM.     haloperidol (HALDOL) 5 MG tablet Take 10 mg by mouth at bedtime.     haloperidol decanoate (HALDOL DECANOATE) 100 MG/ML injection Inject 150 mg into the muscle every 28 (twenty-eight) days.     simvastatin (ZOCOR) 20 MG tablet Take 20 mg by mouth daily.     albuterol (VENTOLIN HFA) 108 (90 Base) MCG/ACT inhaler Inhale 1-2 puffs into the lungs every 6 (six) hours as needed for wheezing or shortness of breath. 18 g 0   amLODipine (NORVASC) 5 MG tablet Take 5 mg by mouth daily.     doxycycline (VIBRAMYCIN) 100 MG capsule Take 1 capsule (100 mg total) by mouth 2 (two) times daily. 14 capsule 0   glimepiride (AMARYL) 4 MG tablet Take 1 tablet (4 mg total) by mouth daily with breakfast.     hydrOXYzine (VISTARIL) 50 MG capsule Take 50 mg by mouth at bedtime as needed for anxiety.     insulin detemir (LEVEMIR) 100 UNIT/ML injection Inject 0.4 mLs (40 Units total) into the skin 2 (two) times daily. 10 mL 11   lithium carbonate (ESKALITH) 450 MG CR tablet Take 1 tablet (450 mg total) by mouth 2 (two) times daily. 60 tablet 0   metoprolol tartrate (LOPRESSOR) 25 MG tablet  Take 0.5 tablets (12.5 mg total) by mouth 2 (two) times daily. 30 tablet 0   ondansetron (ZOFRAN) 4 MG tablet Take 1 tablet (4 mg total) by mouth every 6 (six) hours. 12 tablet 0   pantoprazole (PROTONIX) 40 MG tablet Take 1 tablet (40 mg total) by mouth daily. 30 tablet 0   PROZAC 40 MG capsule Take 40 mg by mouth daily.      Labs  Lab Results:  Admission on 10/25/2021  Component Date Value Ref Range Status   SARS Coronavirus 2 by RT PCR 10/25/2021 NEGATIVE  NEGATIVE Final   Comment: (NOTE) SARS-CoV-2 target nucleic acids are NOT DETECTED.  The SARS-CoV-2 RNA is generally detectable in upper respiratory specimens during the acute phase of infection. The lowest concentration of SARS-CoV-2 viral copies this assay can detect is 138 copies/mL. A negative result does not preclude SARS-Cov-2 infection and should not be used as the sole basis for treatment or other patient management decisions. A negative result may occur with  improper specimen collection/handling, submission of specimen other than nasopharyngeal swab, presence of viral mutation(s) within the areas targeted by this assay, and inadequate number of viral copies(<138 copies/mL). A negative result must be combined with clinical observations, patient history, and epidemiological information. The expected result is Negative.  Fact Sheet for Patients:  EntrepreneurPulse.com.au  Fact Sheet for Healthcare Providers:  IncredibleEmployment.be  This test is no  t yet approved or cleared by the [place] and  has been authorized for detection and/or diagnosis of SARS-CoV-2 by FDA under an Emergency Use Authorization (EUA). This EUA will remain  in effect (meaning this test can be used) for the duration of the COVID-19 declaration under Section 564(b)(1) of the Act, 21 U.S.C.section 360bbb-3(b)(1), unless the authorization is terminated  or revoked sooner.        Influenza A by PCR 10/25/2021 NEGATIVE  NEGATIVE Final   Influenza B by PCR 10/25/2021 NEGATIVE  NEGATIVE Final   Comment: (NOTE) The Xpert Xpress SARS-CoV-2/FLU/RSV plus assay is intended as an aid in the diagnosis of influenza from Nasopharyngeal swab specimens and should not be used as a sole basis for treatment. Nasal washings and aspirates are unacceptable for Xpert Xpress SARS-CoV-2/FLU/RSV testing.  Fact Sheet for Patients: EntrepreneurPulse.com.au  Fact Sheet for Healthcare Providers: IncredibleEmployment.be  This test is not yet approved or cleared by the [place] FDA and has been authorized for detection and/or diagnosis of SARS-CoV-2 by FDA under an Emergency Use Authorization (EUA). This EUA will remain in effect (meaning this test can be used) for the duration of the COVID-19 declaration under Section 564(b)(1) of the Act, 21 U.S.C. section 360bbb-3(b)(1), unless the authorization is terminated or revoked.  Performed at [facility], [place] [address], [place], [place] [address]    WBC 10/25/2021 14.9 (H)  4.0 - 10.5 K/uL Final   RBC 10/25/2021 4.04  3.87 - 5.11 MIL/uL Final   Hemoglobin 10/25/2021 10.0 (L)  12.0 - 15.0 g/dL Final   HCT 10/25/2021 32.6 (L)  36.0 - 46.0 % Final   MCV 10/25/2021 80.7  80.0 - 100.0 fL Final   MCH 10/25/2021 24.8 (L)  26.0 - 34.0 pg Final   MCHC 10/25/2021 30.7  30.0 - 36.0 g/dL Final   RDW 10/25/2021 15.4  11.5 - 15.5 % Final   Platelets 10/25/2021 213  150 - 400 K/uL Final   REPEATED TO VERIFY   nRBC 10/25/2021 0.0  0.0 - 0.2 % Final   Neutrophils Relative % 10/25/2021 71  % Final   Neutro Abs 10/25/2021 10.5 (H)  1.7 - 7.7 K/uL Final   Lymphocytes Relative 10/25/2021 20  % Final   Lymphs Abs 10/25/2021 3.0  0.7 - 4.0 K/uL Final   Monocytes Relative 10/25/2021 6  % Final   Monocytes Absolute 10/25/2021 0.8  0.1 - 1.0 K/uL Final   Eosinophils Relative 10/25/2021 3  % Final    Eosinophils Absolute 10/25/2021 0.5  0.0 - 0.5 K/uL Final   Basophils Relative 10/25/2021 0  % Final   Basophils Absolute 10/25/2021 0.1  0.0 - 0.1 K/uL Final   Immature Granulocytes 10/25/2021 0  % Final   Abs Immature Granulocytes 10/25/2021 0.05  0.00 - 0.07 K/uL Final   Performed at [facility], [place] [address], [place], [place] [address]   Sodium 10/25/2021 141  135 - 145 mmol/L Final   Potassium 10/25/2021 4.3  3.5 - 5.1 mmol/L Final   Chloride 10/25/2021 109  98 - 111 mmol/L Final   CO2 10/25/2021 22  22 - 32 mmol/L Final   Glucose, Bld 10/25/2021 197 (H)  70 - 99 mg/dL Final   Glucose reference range applies only to samples taken after fasting for at least 8 hours.   BUN 10/25/2021 5 (L)  6 - 20 mg/dL Final   Creatinine, Ser 10/25/2021 0.88  0.44 - 1.00 mg/dL Final  Calcium 10/25/2021 9.2  8.9 - 10.3 mg/dL Final   Total Protein 10/25/2021 6.4 (L)  6.5 - 8.1 g/dL Final   Albumin 10/25/2021 3.6  3.5 - 5.0 g/dL Final   AST 10/25/2021 18  15 - 41 U/L Final   ALT 10/25/2021 18  0 - 44 U/L Final   Alkaline Phosphatase 10/25/2021 52  38 - 126 U/L Final   Total Bilirubin 10/25/2021 0.6  0.3 - 1.2 mg/dL Final   GFR, Estimated 10/25/2021 >60  >60 mL/min Final   Comment: (NOTE) Calculated using the CKD-EPI Creatinine Equation (2021)    Anion gap 10/25/2021 10  5 - 15 Final   Performed at [facility], [place] [address], [place], [place] [address]   Alcohol, Ethyl (B) 10/25/2021 <10  <10 mg/dL Final   Comment: (NOTE) Lowest detectable limit for serum alcohol is 10 mg/dL.  For medical purposes only. Performed at [facility], [place] [address], [place], [place] [address]    POC Amphetamine UR 10/25/2021 None Detected  NONE DETECTED (Cut Off Level 1000 ng/mL) Preliminary   POC Secobarbital (BAR) 10/25/2021 None Detected  NONE DETECTED (Cut Off Level 300 ng/mL) Preliminary   POC Buprenorphine (BUP) 10/25/2021 None Detected  NONE DETECTED (Cut Off Level 10 ng/mL) Preliminary    POC Oxazepam (BZO) 10/25/2021 None Detected  NONE DETECTED (Cut Off Level 300 ng/mL) Preliminary   POC Cocaine UR 10/25/2021 None Detected  NONE DETECTED (Cut Off Level 300 ng/mL) Preliminary   POC Methamphetamine UR 10/25/2021 None Detected  NONE DETECTED (Cut Off Level 1000 ng/mL) Preliminary   POC Morphine 10/25/2021 None Detected  NONE DETECTED (Cut Off Level 300 ng/mL) Preliminary   POC Methadone UR 10/25/2021 None Detected  NONE DETECTED (Cut Off Level 300 ng/mL) Preliminary   POC Oxycodone UR 10/25/2021 None Detected  NONE DETECTED (Cut Off Level 100 ng/mL) Preliminary   POC Marijuana UR 10/25/2021 None Detected  NONE DETECTED (Cut Off Level 50 ng/mL) Preliminary   Lithium Lvl 10/25/2021 0.54 (L)  0.60 - 1.20 mmol/L Final   Performed at [facility], [place] [address], [place], [place] [address]   SARS Coronavirus 2 Ag 10/25/2021 Negative  Negative Final   Glucose-Capillary 10/25/2021 184 (H)  70 - 99 mg/dL Final   Glucose reference range applies only to samples taken after fasting for at least 8 hours.   Preg Test, Ur 10/25/2021 NEGATIVE  NEGATIVE Final   Comment:        THE SENSITIVITY OF THIS METHODOLOGY IS >24 mIU/mL   Admission on 10/22/2021, Discharged on 10/22/2021  Component Date Value Ref Range Status   WBC 10/22/2021 12.7 (H)  4.0 - 10.5 K/uL Final   RBC 10/22/2021 4.71  3.87 - 5.11 MIL/uL Final   Hemoglobin 10/22/2021 11.9 (L)  12.0 - 15.0 g/dL Final   HCT 10/22/2021 38.2  36.0 - 46.0 % Final   MCV 10/22/2021 81.1  80.0 - 100.0 fL Final   MCH 10/22/2021 25.3 (L)  26.0 - 34.0 pg Final   MCHC 10/22/2021 31.2  30.0 - 36.0 g/dL Final   RDW 10/22/2021 15.4  11.5 - 15.5 % Final   Platelets 10/22/2021 279  150 - 400 K/uL Final   nRBC 10/22/2021 0.0  0.0 - 0.2 % Final   Neutrophils Relative % 10/22/2021 59  % Final   Neutro Abs 10/22/2021 7.7  1.7 - 7.7 K/uL Final   Lymphocytes Relative 10/22/2021 28  % Final   Lymphs Abs 10/22/2021  3.5  0.7 - 4.0 K/uL Final    Monocytes Relative 10/22/2021 6  % Final   Monocytes Absolute 10/22/2021 0.7  0.1 - 1.0 K/uL Final   Eosinophils Relative 10/22/2021 6  % Final   Eosinophils Absolute 10/22/2021 0.7 (H)  0.0 - 0.5 K/uL Final   Basophils Relative 10/22/2021 1  % Final   Basophils Absolute 10/22/2021 0.1  0.0 - 0.1 K/uL Final   Immature Granulocytes 10/22/2021 0  % Final   Abs Immature Granulocytes 10/22/2021 0.03  0.00 - 0.07 K/uL Final   Performed at [facility], [place] [address] [place], [place] [address]  Admission on 10/20/2021, Discharged on 10/20/2021  Component Date Value Ref Range Status   WBC 10/20/2021 10.7 (H)  4.0 - 10.5 K/uL Final   RBC 10/20/2021 4.44  3.87 - 5.11 MIL/uL Final   Hemoglobin 10/20/2021 11.2 (L)  12.0 - 15.0 g/dL Final   HCT 10/20/2021 36.0  36.0 - 46.0 % Final   MCV 10/20/2021 81.1  80.0 - 100.0 fL Final   MCH 10/20/2021 25.2 (L)  26.0 - 34.0 pg Final   MCHC 10/20/2021 31.1  30.0 - 36.0 g/dL Final   RDW 10/20/2021 15.3  11.5 - 15.5 % Final   Platelets 10/20/2021 260  150 - 400 K/uL Final   nRBC 10/20/2021 0.0  0.0 - 0.2 % Final   Neutrophils Relative % 10/20/2021 65  % Final   Neutro Abs 10/20/2021 6.9  1.7 - 7.7 K/uL Final   Lymphocytes Relative 10/20/2021 24  % Final   Lymphs Abs 10/20/2021 2.6  0.7 - 4.0 K/uL Final   Monocytes Relative 10/20/2021 5  % Final   Monocytes Absolute 10/20/2021 0.5  0.1 - 1.0 K/uL Final   Eosinophils Relative 10/20/2021 5  % Final   Eosinophils Absolute 10/20/2021 0.6 (H)  0.0 - 0.5 K/uL Final   Basophils Relative 10/20/2021 1  % Final   Basophils Absolute 10/20/2021 0.1  0.0 - 0.1 K/uL Final   Immature Granulocytes 10/20/2021 0  % Final   Abs Immature Granulocytes 10/20/2021 0.04  0.00 - 0.07 K/uL Final   Performed at [facility], [place] [address], [place], [place] [address]   I-stat hCG, quantitative 10/20/2021 <5.0  <5 mIU/mL Final   Comment 3 10/20/2021          Final   Comment:   GEST. AGE       CONC.  (mIU/mL)   <=1 WEEK        5 - 50     2 WEEKS       50 - 500     3 WEEKS       100 - 10,000     4 WEEKS     1,000 - 30,000        FEMALE AND NON-PREGNANT FEMALE:     LESS THAN 5 mIU/mL    Sodium 10/20/2021 141  135 - 145 mmol/L Final   Potassium 10/20/2021 3.7  3.5 - 5.1 mmol/L Final   Chloride 10/20/2021 110  98 - 111 mmol/L Final   CO2 10/20/2021 28  22 - 32 mmol/L Final   Glucose, Bld 10/20/2021 139 (H)  70 - 99 mg/dL Final   Glucose reference range applies only to samples taken after fasting for at least 8 hours.   BUN 10/20/2021 10  6 - 20 mg/dL Final   Creatinine, Ser 10/20/2021 0.82  0.44 - 1.00 mg/dL Final   Calcium 10/20/2021  9.1  8.9 - 10.3 mg/dL Final   Total Protein 10/20/2021 7.5  6.5 - 8.1 g/dL Final   Albumin 10/20/2021 4.1  3.5 - 5.0 g/dL Final   AST 10/20/2021 22  15 - 41 U/L Final   ALT 10/20/2021 24  0 - 44 U/L Final   Alkaline Phosphatase 10/20/2021 48  38 - 126 U/L Final   Total Bilirubin 10/20/2021 0.5  0.3 - 1.2 mg/dL Final   GFR, Estimated 10/20/2021 >60  >60 mL/min Final   Comment: (NOTE) Calculated using the CKD-EPI Creatinine Equation (2021)    Anion gap 10/20/2021 3 (L)  5 - 15 Final   Performed at [facility], [place] [address] [place], [place] [address]  Admission on 10/13/2021, Discharged on 10/14/2021  Component Date Value Ref Range Status   Sodium 10/13/2021 141  135 - 145 mmol/L Final   Potassium 10/13/2021 3.8  3.5 - 5.1 mmol/L Final   Chloride 10/13/2021 110  98 - 111 mmol/L Final   CO2 10/13/2021 27  22 - 32 mmol/L Final   Glucose, Bld 10/13/2021 95  70 - 99 mg/dL Final   Glucose reference range applies only to samples taken after fasting for at least 8 hours.   BUN 10/13/2021 12  6 - 20 mg/dL Final   Creatinine, Ser 10/13/2021 0.79  0.44 - 1.00 mg/dL Final   Calcium 10/13/2021 9.2  8.9 - 10.3 mg/dL Final   Total Protein 10/13/2021 7.9  6.5 - 8.1 g/dL Final   Albumin 10/13/2021 4.2  3.5 - 5.0 g/dL Final   AST  10/13/2021 24  15 - 41 U/L Final   ALT 10/13/2021 24  0 - 44 U/L Final   Alkaline Phosphatase 10/13/2021 52  38 - 126 U/L Final   Total Bilirubin 10/13/2021 0.5  0.3 - 1.2 mg/dL Final   GFR, Estimated 10/13/2021 >60  >60 mL/min Final   Comment: (NOTE) Calculated using the CKD-EPI Creatinine Equation (2021)    Anion gap 10/13/2021 4 (L)  5 - 15 Final   Performed at [facility], [place] [address], [place], [place] [address]   Alcohol, Ethyl (B) 10/13/2021 <10  <10 mg/dL Final   Comment: (NOTE) Lowest detectable limit for serum alcohol is 10 mg/dL.  For medical purposes only. Performed at [facility], [place] [address], [place], [place] [address]    Salicylate Lvl 95/62/1308 <7.0 (L)  7.0 - 30.0 mg/dL Final   Performed at [place] [address], [place], [place] [address]   Acetaminophen (Tylenol), Serum 10/13/2021 <10 (L)  10 - 30 ug/mL Final   Comment: (NOTE) Therapeutic concentrations vary significantly. A range of 10-30 ug/mL  may be an effective concentration for many patients. However, some  are best treated at concentrations outside of this range. Acetaminophen concentrations >150 ug/mL at 4 hours after ingestion  and >50 ug/mL at 12 hours after ingestion are often associated with  toxic reactions.  Performed at [facility], [place] [address], [place], [place] [address]    WBC 10/13/2021 14.6 (H)  4.0 - 10.5 K/uL Final   RBC 10/13/2021 4.27  3.87 - 5.11 MIL/uL Final   Hemoglobin 10/13/2021 10.9 (L)  12.0 - 15.0 g/dL Final   HCT 10/13/2021 34.8 (L)  36.0 - 46.0 % Final   MCV 10/13/2021 81.5  80.0 - 100.0 fL Final   MCH 10/13/2021 25.5 (L)  26.0 - 34.0 pg Final   MCHC 10/13/2021 31.3  30.0 - 36.0 g/dL Final   RDW 10/13/2021 15.0  11.5 - 15.5 % Final   Platelets 10/13/2021 304  150 - 400 K/uL Final   nRBC 10/13/2021 0.0  0.0 - 0.2 % Final   Performed at [facility], [place]  [address], [place], [place] [address]   Opiates 10/14/2021 NONE DETECTED  NONE DETECTED Final   Cocaine 10/14/2021 NONE DETECTED  NONE DETECTED Final   Benzodiazepines 10/14/2021 NONE DETECTED  NONE DETECTED Final   Amphetamines 10/14/2021 NONE DETECTED  NONE DETECTED Final   Tetrahydrocannabinol 10/14/2021 NONE DETECTED  NONE DETECTED Final   Barbiturates 10/14/2021 POSITIVE (A)  NONE DETECTED Final   Comment: (NOTE) DRUG SCREEN FOR MEDICAL PURPOSES ONLY.  IF CONFIRMATION IS NEEDED FOR ANY PURPOSE, NOTIFY LAB WITHIN 5 DAYS.  LOWEST DETECTABLE LIMITS FOR URINE DRUG SCREEN Drug Class                     Cutoff (ng/mL) Amphetamine and metabolites    1000 Barbiturate and metabolites    200 Benzodiazepine                 604 Tricyclics and metabolites     300 Opiates and metabolites        300 Cocaine and metabolites        300 THC                            50 Performed at [facility], [place] [address] [place], [place] [address]    I-stat hCG, quantitative 10/13/2021 <5.0  <5 mIU/mL Final   Comment 3 10/13/2021          Final   Comment:   GEST. AGE      CONC.  (mIU/mL)   <=1 WEEK        5 - 50     2 WEEKS       50 - 500     3 WEEKS       100 - 10,000     4 WEEKS     1,000 - 30,000        FEMALE AND NON-PREGNANT FEMALE:     LESS THAN 5 mIU/mL    Lithium Lvl 10/13/2021 0.73  0.60 - 1.20 mmol/L Final   Performed at [facility], [place] [address], [place], [place] [address]   Valproic Acid Lvl 10/13/2021 <20 (L)  50.0 - 100.0 ug/mL Final   Comment: RESULTS CONFIRMED BY MANUAL DILUTION LIPEMIC SPECIMEN, RESULTS MAY BE AFFECTED. Performed at [facility], [place] [address], [place], [place] [address]    SARS Coronavirus 2 by RT PCR 10/13/2021 NEGATIVE  NEGATIVE Final   Comment: (NOTE) SARS-CoV-2 target nucleic acids are NOT DETECTED.  The SARS-CoV-2 RNA is generally detectable in upper respiratory specimens during the acute phase  of infection. The lowest concentration of SARS-CoV-2 viral copies this assay can detect is 138 copies/mL. A negative result does not preclude SARS-Cov-2 infection and should not be used as the sole basis for treatment or other patient management decisions. A negative result may occur with  improper specimen collection/handling, submission of specimen other than nasopharyngeal swab, presence of viral mutation(s) within the areas targeted by this assay, and inadequate number of viral copies(<138 copies/mL). A negative result must be combined with clinical observations, patient history, and epidemiological information. The expected result is Negative.  Fact Sheet for Patients:  EntrepreneurPulse.com.au  Fact Sheet for Healthcare Providers:  IncredibleEmployment.be  This test is no                          t yet approved or cleared by the [place] FDA and  has been authorized for detection and/or diagnosis of SARS-CoV-2 by FDA under an Emergency Use Authorization (EUA). This EUA will remain  in effect (meaning this test can be used) for the duration of the COVID-19 declaration under Section 564(b)(1) of the Act, 21 U.S.C.section 360bbb-3(b)(1), unless the authorization is terminated  or revoked sooner.       Influenza A by PCR 10/13/2021 NEGATIVE  NEGATIVE Final   Influenza B by PCR 10/13/2021 NEGATIVE  NEGATIVE Final   Comment: (NOTE) The Xpert Xpress SARS-CoV-2/FLU/RSV plus assay is intended as an aid in the diagnosis of influenza from Nasopharyngeal swab specimens and should not be used as a sole basis for treatment. Nasal washings and aspirates are unacceptable for Xpert Xpress SARS-CoV-2/FLU/RSV testing.  Fact Sheet for Patients: EntrepreneurPulse.com.au  Fact Sheet for Healthcare Providers: IncredibleEmployment.be  This test is not yet approved or cleared by the [place] FDA and has been  authorized for detection and/or diagnosis of SARS-CoV-2 by FDA under an Emergency Use Authorization (EUA). This EUA will remain in effect (meaning this test can be used) for the duration of the COVID-19 declaration under Section 564(b)(1) of the Act, 21 U.S.C. section 360bbb-3(b)(1), unless the authorization is terminated or revoked.  Performed at [facility], [place] [address] [place], [place] [address]    Glucose-Capillary 10/14/2021 126 (H)  70 - 99 mg/dL Final   Glucose reference range applies only to samples taken after fasting for at least 8 hours.  Admission on 10/11/2021, Discharged on 10/12/2021  Component Date Value Ref Range Status   SARS Coronavirus 2 by RT PCR 10/11/2021 NEGATIVE  NEGATIVE Final   Comment: (NOTE) SARS-CoV-2 target nucleic acids are NOT DETECTED.  The SARS-CoV-2 RNA is generally detectable in upper respiratory specimens during the acute phase of infection. The lowest concentration of SARS-CoV-2 viral copies this assay can detect is 138 copies/mL. A negative result does not preclude SARS-Cov-2 infection and should not be used as the sole basis for treatment or other patient management decisions. A negative result may occur with  improper specimen collection/handling, submission of specimen other than nasopharyngeal swab, presence of viral mutation(s) within the areas targeted by this assay, and inadequate number of viral copies(<138 copies/mL). A negative result must be combined with clinical observations, patient history, and epidemiological information. The expected result is Negative.  Fact Sheet for Patients:  EntrepreneurPulse.com.au  Fact Sheet for Healthcare Providers:  IncredibleEmployment.be  This test is no                          t yet approved or cleared by the [place] FDA and  has been authorized for detection and/or diagnosis of SARS-CoV-2 by FDA under an Emergency Use  Authorization (EUA). This EUA will remain  in effect (meaning this test can be used) for the duration of the COVID-19 declaration under Section 564(b)(1) of the Act, 21 U.S.C.section 360bbb-3(b)(1), unless the authorization is terminated  or revoked sooner.       Influenza A by PCR 10/11/2021 NEGATIVE  NEGATIVE Final   Influenza B by PCR 10/11/2021 NEGATIVE  NEGATIVE Final   Comment: (NOTE) The Xpert Xpress SARS-CoV-2/FLU/RSV plus assay  is intended as an aid in the diagnosis of influenza from Nasopharyngeal swab specimens and should not be used as a sole basis for treatment. Nasal washings and aspirates are unacceptable for Xpert Xpress SARS-CoV-2/FLU/RSV testing.  Fact Sheet for Patients: EntrepreneurPulse.com.au  Fact Sheet for Healthcare Providers: IncredibleEmployment.be  This test is not yet approved or cleared by the [place] FDA and has been authorized for detection and/or diagnosis of SARS-CoV-2 by FDA under an Emergency Use Authorization (EUA). This EUA will remain in effect (meaning this test can be used) for the duration of the COVID-19 declaration under Section 564(b)(1) of the Act, 21 U.S.C. section 360bbb-3(b)(1), unless the authorization is terminated or revoked.  Performed at [facility], [place] [address], [place], [place] [address]    WBC 10/12/2021 9.6  4.0 - 10.5 K/uL Final   RBC 10/12/2021 4.27  3.87 - 5.11 MIL/uL Final   Hemoglobin 10/12/2021 10.8 (L)  12.0 - 15.0 g/dL Final   HCT 10/12/2021 34.3 (L)  36.0 - 46.0 % Final   MCV 10/12/2021 80.3  80.0 - 100.0 fL Final   MCH 10/12/2021 25.3 (L)  26.0 - 34.0 pg Final   MCHC 10/12/2021 31.5  30.0 - 36.0 g/dL Final   RDW 10/12/2021 14.8  11.5 - 15.5 % Final   Platelets 10/12/2021 233  150 - 400 K/uL Final   nRBC 10/12/2021 0.0  0.0 - 0.2 % Final   Neutrophils Relative % 10/12/2021 54  % Final   Neutro Abs 10/12/2021 5.3  1.7 - 7.7 K/uL Final   Lymphocytes  Relative 10/12/2021 34  % Final   Lymphs Abs 10/12/2021 3.3  0.7 - 4.0 K/uL Final   Monocytes Relative 10/12/2021 6  % Final   Monocytes Absolute 10/12/2021 0.5  0.1 - 1.0 K/uL Final   Eosinophils Relative 10/12/2021 5  % Final   Eosinophils Absolute 10/12/2021 0.4  0.0 - 0.5 K/uL Final   Basophils Relative 10/12/2021 1  % Final   Basophils Absolute 10/12/2021 0.1  0.0 - 0.1 K/uL Final   Immature Granulocytes 10/12/2021 0  % Final   Abs Immature Granulocytes 10/12/2021 0.04  0.00 - 0.07 K/uL Final   Performed at [facility], [place] [address], [place], [place] [address]   Sodium 10/12/2021 141  135 - 145 mmol/L Final   Potassium 10/12/2021 3.6  3.5 - 5.1 mmol/L Final   Chloride 10/12/2021 105  98 - 111 mmol/L Final   CO2 10/12/2021 27  22 - 32 mmol/L Final   Glucose, Bld 10/12/2021 156 (H)  70 - 99 mg/dL Final   Glucose reference range applies only to samples taken after fasting for at least 8 hours.   BUN 10/12/2021 14  6 - 20 mg/dL Final   Creatinine, Ser 10/12/2021 0.88  0.44 - 1.00 mg/dL Final   Calcium 10/12/2021 9.2  8.9 - 10.3 mg/dL Final   Total Protein 10/12/2021 6.6  6.5 - 8.1 g/dL Final   Albumin 10/12/2021 3.5  3.5 - 5.0 g/dL Final   AST 10/12/2021 19  15 - 41 U/L Final   ALT 10/12/2021 25  0 - 44 U/L Final   Alkaline Phosphatase 10/12/2021 48  38 - 126 U/L Final   Total Bilirubin 10/12/2021 0.4  0.3 - 1.2 mg/dL Final   GFR, Estimated 10/12/2021 >60  >60 mL/min Final   Comment: (NOTE) Calculated using the CKD-EPI Creatinine Equation (2021)    Anion gap 10/12/2021 9  5 - 15 Final  Performed at [facility], [place] [address] [place], [place] [address]   POC Amphetamine UR 10/11/2021 None Detected  NONE DETECTED (Cut Off Level 1000 ng/mL) Final   POC Secobarbital (BAR) 10/11/2021 Positive (A)  NONE DETECTED (Cut Off Level 300 ng/mL) Final   POC Buprenorphine (BUP) 10/11/2021 None Detected  NONE DETECTED (Cut Off Level 10 ng/mL) Final   POC Oxazepam (BZO)  10/11/2021 None Detected  NONE DETECTED (Cut Off Level 300 ng/mL) Final   POC Cocaine UR 10/11/2021 None Detected  NONE DETECTED (Cut Off Level 300 ng/mL) Final   POC Methamphetamine UR 10/11/2021 None Detected  NONE DETECTED (Cut Off Level 1000 ng/mL) Final   POC Morphine 10/11/2021 None Detected  NONE DETECTED (Cut Off Level 300 ng/mL) Final   POC Methadone UR 10/11/2021 None Detected  NONE DETECTED (Cut Off Level 300 ng/mL) Final   POC Oxycodone UR 10/11/2021 None Detected  NONE DETECTED (Cut Off Level 100 ng/mL) Final   POC Marijuana UR 10/11/2021 None Detected  NONE DETECTED (Cut Off Level 50 ng/mL) Final   Lithium Lvl 10/12/2021 0.75  0.60 - 1.20 mmol/L Final   Performed at [facility], [place] [address], [place], [place] [address]   SARSCOV2ONAVIRUS 2 AG 10/11/2021 NEGATIVE  NEGATIVE Final   Comment: (NOTE) SARS-CoV-2 antigen NOT DETECTED.   Negative results are presumptive.  Negative results do not preclude SARS-CoV-2 infection and should not be used as the sole basis for treatment or other patient management decisions, including infection  control decisions, particularly in the presence of clinical signs and  symptoms consistent with COVID-19, or in those who have been in contact with the virus.  Negative results must be combined with clinical observations, patient history, and epidemiological information. The expected result is Negative.  Fact Sheet for Patients: HandmadeRecipes.com.cy  Fact Sheet for Healthcare Providers: FuneralLife.at  This test is not yet approved or cleared by the [place] FDA and  has been authorized for detection and/or diagnosis of SARS-CoV-2 by FDA under an Emergency Use Authorization (EUA).  This EUA will remain in effect (meaning this test can be used) for the duration of  the COV                          ID-19 declaration under Section 564(b)(1) of the Act, 21 U.S.C. section 360bbb-3(b)(1),  unless the authorization is terminated or revoked sooner.     Glucose-Capillary 10/11/2021 123 (H)  70 - 99 mg/dL Final   Glucose reference range applies only to samples taken after fasting for at least 8 hours.   Preg Test, Ur 10/11/2021 NEGATIVE  NEGATIVE Final   Comment:        THE SENSITIVITY OF THIS METHODOLOGY IS >24 mIU/mL    Glucose-Capillary 10/12/2021 229 (H)  70 - 99 mg/dL Final   Glucose reference range applies only to samples taken after fasting for at least 8 hours.  Admission on 09/28/2021, Discharged on 09/29/2021  Component Date Value Ref Range Status   SARS Coronavirus 2 by RT PCR 09/28/2021 NEGATIVE  NEGATIVE Final   Comment: (NOTE) SARS-CoV-2 target nucleic acids are NOT DETECTED.  The SARS-CoV-2 RNA is generally detectable in upper respiratory specimens during the acute phase of infection. The lowest concentration of SARS-CoV-2 viral copies this assay can detect is 138 copies/mL. A negative result does not preclude SARS-Cov-2 infection and should not be used as the sole basis for treatment or other patient management decisions.  A negative result may occur with  improper specimen collection/handling, submission of specimen other than nasopharyngeal swab, presence of viral mutation(s) within the areas targeted by this assay, and inadequate number of viral copies(<138 copies/mL). A negative result must be combined with clinical observations, patient history, and epidemiological information. The expected result is Negative.  Fact Sheet for Patients:  EntrepreneurPulse.com.au  Fact Sheet for Healthcare Providers:  IncredibleEmployment.be  This test is no                          t yet approved or cleared by the [place] FDA and  has been authorized for detection and/or diagnosis of SARS-CoV-2 by FDA under an Emergency Use Authorization (EUA). This EUA will remain  in effect (meaning this test can be used) for the  duration of the COVID-19 declaration under Section 564(b)(1) of the Act, 21 U.S.C.section 360bbb-3(b)(1), unless the authorization is terminated  or revoked sooner.       Influenza A by PCR 09/28/2021 NEGATIVE  NEGATIVE Final   Influenza B by PCR 09/28/2021 NEGATIVE  NEGATIVE Final   Comment: (NOTE) The Xpert Xpress SARS-CoV-2/FLU/RSV plus assay is intended as an aid in the diagnosis of influenza from Nasopharyngeal swab specimens and should not be used as a sole basis for treatment. Nasal washings and aspirates are unacceptable for Xpert Xpress SARS-CoV-2/FLU/RSV testing.  Fact Sheet for Patients: EntrepreneurPulse.com.au  Fact Sheet for Healthcare Providers: IncredibleEmployment.be  This test is not yet approved or cleared by the [place] FDA and has been authorized for detection and/or diagnosis of SARS-CoV-2 by FDA under an Emergency Use Authorization (EUA). This EUA will remain in effect (meaning this test can be used) for the duration of the COVID-19 declaration under Section 564(b)(1) of the Act, 21 U.S.C. section 360bbb-3(b)(1), unless the authorization is terminated or revoked.  Performed at [facility], [place] [address] [place], [place] [address]    WBC 09/28/2021 9.8  4.0 - 10.5 K/uL Final   RBC 09/28/2021 3.90  3.87 - 5.11 MIL/uL Final   Hemoglobin 09/28/2021 10.0 (L)  12.0 - 15.0 g/dL Final   HCT 09/28/2021 31.1 (L)  36.0 - 46.0 % Final   MCV 09/28/2021 79.7 (L)  80.0 - 100.0 fL Final   MCH 09/28/2021 25.6 (L)  26.0 - 34.0 pg Final   MCHC 09/28/2021 32.2  30.0 - 36.0 g/dL Final   RDW 09/28/2021 15.6 (H)  11.5 - 15.5 % Final   Platelets 09/28/2021 211  150 - 400 K/uL Final   REPEATED TO VERIFY   nRBC 09/28/2021 0.0  0.0 - 0.2 % Final   Neutrophils Relative % 09/28/2021 52  % Final   Neutro Abs 09/28/2021 5.2  1.7 - 7.7 K/uL Final   Lymphocytes Relative 09/28/2021 36  % Final   Lymphs Abs 09/28/2021 3.5  0.7 - 4.0  K/uL Final   Monocytes Relative 09/28/2021 7  % Final   Monocytes Absolute 09/28/2021 0.7  0.1 - 1.0 K/uL Final   Eosinophils Relative 09/28/2021 4  % Final   Eosinophils Absolute 09/28/2021 0.4  0.0 - 0.5 K/uL Final   Basophils Relative 09/28/2021 1  % Final   Basophils Absolute 09/28/2021 0.1  0.0 - 0.1 K/uL Final   Immature Granulocytes 09/28/2021 0  % Final   Abs Immature Granulocytes 09/28/2021 0.03  0.00 - 0.07 K/uL Final   Performed at [facility], [place] [address], [place], [place] [address]  Sodium 09/28/2021 139  135 - 145 mmol/L Final   Potassium 09/28/2021 3.8  3.5 - 5.1 mmol/L Final   Chloride 09/28/2021 106  98 - 111 mmol/L Final   CO2 09/28/2021 23  22 - 32 mmol/L Final   Glucose, Bld 09/28/2021 113 (H)  70 - 99 mg/dL Final   Glucose reference range applies only to samples taken after fasting for at least 8 hours.   BUN 09/28/2021 6  6 - 20 mg/dL Final   Creatinine, Ser 09/28/2021 0.86  0.44 - 1.00 mg/dL Final   Calcium 09/28/2021 9.4  8.9 - 10.3 mg/dL Final   Total Protein 09/28/2021 6.9  6.5 - 8.1 g/dL Final   Albumin 09/28/2021 3.7  3.5 - 5.0 g/dL Final   AST 09/28/2021 20  15 - 41 U/L Final   ALT 09/28/2021 15  0 - 44 U/L Final   Alkaline Phosphatase 09/28/2021 51  38 - 126 U/L Final   Total Bilirubin 09/28/2021 0.3  0.3 - 1.2 mg/dL Final   GFR, Estimated 09/28/2021 >60  >60 mL/min Final   Comment: (NOTE) Calculated using the CKD-EPI Creatinine Equation (2021)    Anion gap 09/28/2021 10  5 - 15 Final   Performed at [place] [address], [place], [place] [address]   Hgb A1c MFr Bld 09/28/2021 5.7 (H)  4.8 - 5.6 % Final   Comment: (NOTE) Pre diabetes:          5.7%-6.4%  Diabetes:              >6.4%  Glycemic control for   <7.0% adults with diabetes    Mean Plasma Glucose 09/28/2021 116.89  mg/dL Final   Performed at [facility], [place] [address] [place], [place] [address]   Alcohol, Ethyl (B) 09/28/2021 <10  <10 mg/dL Final    Comment: (NOTE) Lowest detectable limit for serum alcohol is 10 mg/dL.  For medical purposes only. Performed at [facility], [place] [address], [place], [place] [address]    Cholesterol 09/28/2021 159  0 - 200 mg/dL Final   Triglycerides 09/28/2021 91  <150 mg/dL Final   HDL 09/28/2021 51  >40 mg/dL Final   Total CHOL/HDL Ratio 09/28/2021 3.1  RATIO Final   VLDL 09/28/2021 18  0 - 40 mg/dL Final   LDL Cholesterol 09/28/2021 90  0 - 99 mg/dL Final   Comment:        Total Cholesterol/HDL:CHD Risk Coronary Heart Disease Risk Table                     Men   Women  1/2 Average Risk   3.4   3.3  Average Risk       5.0   4.4  2 X Average Risk   9.6   7.1  3 X Average Risk  23.4   11.0        Use the calculated Patient Ratio above and the CHD Risk Table to determine the patient's CHD Risk.        ATP III CLASSIFICATION (LDL):  <100     mg/dL   Optimal  100-129  mg/dL   Near or Above                    Optimal  130-159  mg/dL   Borderline  160-189  mg/dL   High  >190     mg/dL   Very High Performed at [facility], [place] [address] [place], [place] [address]    TSH 09/28/2021 2.079  0.350 - 4.500 uIU/mL Final   Comment: Performed by a 3rd Generation assay with a functional sensitivity of <=0.01 uIU/mL. Performed at [facility], [place] [address], [place], [place] [address]    POC Amphetamine UR 09/28/2021 None Detected  NONE DETECTED (Cut Off Level 1000 ng/mL) Final   POC Secobarbital (BAR) 09/28/2021 Positive (A)  NONE DETECTED (Cut Off Level 300 ng/mL) Final   POC Buprenorphine (BUP) 09/28/2021 None Detected  NONE DETECTED (Cut Off Level 10 ng/mL) Final   POC Oxazepam (BZO) 09/28/2021 None Detected  NONE DETECTED (Cut Off Level 300 ng/mL) Final   POC Cocaine UR 09/28/2021 None Detected  NONE DETECTED (Cut Off Level 300 ng/mL) Final   POC Methamphetamine UR 09/28/2021 None Detected  NONE DETECTED (Cut Off Level 1000 ng/mL) Final   POC Morphine 09/28/2021 None  Detected  NONE DETECTED (Cut Off Level 300 ng/mL) Final   POC Methadone UR 09/28/2021 None Detected  NONE DETECTED (Cut Off Level 300 ng/mL) Final   POC Oxycodone UR 09/28/2021 None Detected  NONE DETECTED (Cut Off Level 100 ng/mL) Final   POC Marijuana UR 09/28/2021 None Detected  NONE DETECTED (Cut Off Level 50 ng/mL) Final   Lithium Lvl 09/28/2021 0.41 (L)  0.60 - 1.20 mmol/L Final   Performed at [facility], [place] [address], [place], [place] [address]   SARSCOV2ONAVIRUS 2 AG 09/28/2021 NEGATIVE  NEGATIVE Final   Comment: (NOTE) SARS-CoV-2 antigen NOT DETECTED.   Negative results are presumptive.  Negative results do not preclude SARS-CoV-2 infection and should not be used as the sole basis for treatment or other patient management decisions, including infection  control decisions, particularly in the presence of clinical signs and  symptoms consistent with COVID-19, or in those who have been in contact with the virus.  Negative results must be combined with clinical observations, patient history, and epidemiological information. The expected result is Negative.  Fact Sheet for Patients: HandmadeRecipes.com.cy  Fact Sheet for Healthcare Providers: FuneralLife.at  This test is not yet approved or cleared by the [place] FDA and  has been authorized for detection and/or diagnosis of SARS-CoV-2 by FDA under an Emergency Use Authorization (EUA).  This EUA will remain in effect (meaning this test can be used) for the duration of  the COV                          ID-19 declaration under Section 564(b)(1) of the Act, 21 U.S.C. section 360bbb-3(b)(1), unless the authorization is terminated or revoked sooner.     Preg Test, Ur 09/28/2021 NEGATIVE  NEGATIVE Final   Comment:        THE SENSITIVITY OF THIS METHODOLOGY IS >24 mIU/mL    Glucose-Capillary 09/29/2021 199 (H)  70 - 99 mg/dL Final   Glucose reference range applies only  to samples taken after fasting for at least 8 hours.   Glucose-Capillary 09/29/2021 88  70 - 99 mg/dL Final   Glucose reference range applies only to samples taken after fasting for at least 8 hours.  Admission on 09/11/2021, Discharged on 09/11/2021  Component Date Value Ref Range Status   Glucose-Capillary 09/11/2021 130 (H)  70 - 99 mg/dL Final   Glucose reference range applies only to samples taken after fasting for at least 8 hours.   Comment 1 09/11/2021 Document in Chart   Final  Admission on 08/29/2021, Discharged on 08/29/2021  Component Date Value Ref Range Status   Preg Test, Ur 08/29/2021 NEGATIVE  NEGATIVE Final   Comment:        THE SENSITIVITY OF THIS METHODOLOGY IS >20 mIU/mL. Performed at [facility], [place] [address], [place], [place] [address]    Sodium 08/29/2021 140  135 - 145 mmol/L Final   Potassium 08/29/2021 3.5  3.5 - 5.1 mmol/L Final   Chloride 08/29/2021 103  98 - 111 mmol/L Final   BUN 08/29/2021 5 (L)  6 - 20 mg/dL Final   Creatinine, Ser 08/29/2021 0.70  0.44 - 1.00 mg/dL Final   Glucose, Bld 08/29/2021 113 (H)  70 - 99 mg/dL Final   Glucose reference range applies only to samples taken after fasting for at least 8 hours.   Calcium, Ion 08/29/2021 1.17  1.15 - 1.40 mmol/L Final   TCO2 08/29/2021 28  22 - 32 mmol/L Final   Hemoglobin 08/29/2021 13.3  12.0 - 15.0 g/dL Final   HCT 08/29/2021 39.0  36.0 - 46.0 % Final   I-stat hCG, quantitative 08/29/2021 <5.0  <5 mIU/mL Final   Comment 3 08/29/2021          Final   Comment:   GEST. AGE      CONC.  (mIU/mL)   <=1 WEEK        5 - 50     2 WEEKS       50 - 500     3 WEEKS       100 - 10,000     4 WEEKS     1,000 - 30,000        FEMALE AND NON-PREGNANT FEMALE:     LESS THAN 5 mIU/mL    Total Protein 08/29/2021 8.5 (H)  6.5 - 8.1 g/dL Final   Albumin 08/29/2021 4.5  3.5 - 5.0 g/dL Final   AST 08/29/2021 19  15 - 41 U/L Final   ALT 08/29/2021 16  0 - 44 U/L Final   Alkaline Phosphatase  08/29/2021 59  38 - 126 U/L Final   Total Bilirubin 08/29/2021 0.5  0.3 - 1.2 mg/dL Final   Bilirubin, Direct 08/29/2021 0.1  0.0 - 0.2 mg/dL Final   Indirect Bilirubin 08/29/2021 0.4  0.3 - 0.9 mg/dL Final   Performed at [facility], [place] [address], [place], [place] [address]   WBC 08/29/2021 10.6 (H)  4.0 - 10.5 K/uL Final   RBC 08/29/2021 4.58  3.87 - 5.11 MIL/uL Final   Hemoglobin 08/29/2021 11.7 (L)  12.0 - 15.0 g/dL Final   HCT 08/29/2021 36.7  36.0 - 46.0 % Final   MCV 08/29/2021 80.1  80.0 - 100.0 fL Final   MCH 08/29/2021 25.5 (L)  26.0 - 34.0 pg Final   MCHC 08/29/2021 31.9  30.0 - 36.0 g/dL Final   RDW 08/29/2021 16.5 (H)  11.5 - 15.5 % Final   Platelets 08/29/2021 194  150 - 400 K/uL Final   nRBC 08/29/2021 0.0  0.0 - 0.2 % Final   Neutrophils Relative % 08/29/2021 71  % Final   Neutro Abs 08/29/2021 7.5  1.7 - 7.7 K/uL Final   Lymphocytes Relative 08/29/2021 19  % Final   Lymphs Abs 08/29/2021 2.0  0.7 - 4.0 K/uL Final   Monocytes Relative 08/29/2021 7  % Final   Monocytes Absolute 08/29/2021 0.7  0.1 - 1.0 K/uL Final   Eosinophils Relative 08/29/2021 2  %  Final   Eosinophils Absolute 08/29/2021 0.2  0.0 - 0.5 K/uL Final   Basophils Relative 08/29/2021 1  % Final   Basophils Absolute 08/29/2021 0.1  0.0 - 0.1 K/uL Final   Immature Granulocytes 08/29/2021 0  % Final   Abs Immature Granulocytes 08/29/2021 0.03  0.00 - 0.07 K/uL Final   Performed at [facility], [place] [address], [place], [place] [address]   Lithium Lvl 08/29/2021 0.36 (L)  0.60 - 1.20 mmol/L Final   Performed at [place] [address] [place], [place] [address]   pH, Ven 08/29/2021 7.36  7.25 - 7.43 Final   pCO2, Ven 08/29/2021 53  44 - 60 mmHg Final   pO2, Ven 08/29/2021 <31 (LL)  32 - 45 mmHg Final   Comment: CRITICAL RESULT CALLED TO, READ BACK BY AND VERIFIED WITH: A.WOODY, RN AT 1342 ON 05.05.23 BY N.THOMPSON    Bicarbonate 08/29/2021  29.9 (H)  20.0 - 28.0 mmol/L Final   Acid-Base Excess 08/29/2021 3.5 (H)  0.0 - 2.0 mmol/L Final   O2 Saturation 08/29/2021 53.5  % Final   Patient temperature 08/29/2021 37.0   Final   Performed at [facility], [place] [address] [place], [place] [address]   Alcohol, Ethyl (B) 08/29/2021 <10  <10 mg/dL Final   Comment: (NOTE) Lowest detectable limit for serum alcohol is 10 mg/dL.  For medical purposes only. Performed at [facility], [place] [address], [place], [place] [address]     Blood Alcohol level:  Lab Results  Component Value Date   ETH <10 10/25/2021   ETH <10 47/65/4650    Metabolic Disorder Labs: Lab Results  Component Value Date   HGBA1C 5.7 (H) 09/28/2021   MPG 116.89 09/28/2021   MPG 209 03/24/2021   Lab Results  Component Value Date   PROLACTIN 32.1 (H) 05/03/2020   PROLACTIN 5.3 05/19/2019   Lab Results  Component Value Date   CHOL 159 09/28/2021   TRIG 91 09/28/2021   HDL 51 09/28/2021   CHOLHDL 3.1 09/28/2021   VLDL 18 09/28/2021   LDLCALC 90 09/28/2021   LDLCALC 109 (H) 03/22/2021    Therapeutic Lab Levels: Lab Results  Component Value Date   LITHIUM 0.54 (L) 10/25/2021   LITHIUM 0.73 10/13/2021   Lab Results  Component Value Date   VALPROATE <20 (L) 10/13/2021   VALPROATE <10 (L) 03/23/2021   No results found for: "CBMZ"  Physical Findings   AIMS    Flowsheet Row Admission (Discharged) from OP Visit from 09/12/2019 in [place] Admission (Discharged) from OP Visit from 07/29/2019 in [place] Admission (Discharged) from OP Visit from 07/13/2019 in [place] Admission (Discharged) from 06/23/2019 in [place] Admission (Discharged) from OP Visit from 06/11/2019 in [place] Total Score 0 0 0 0 0      AUDIT    Flowsheet Row Admission (Discharged) from OP Visit from 09/12/2019  in [place] Admission (Discharged) from OP Visit from 07/29/2019 in [place] Admission (Discharged) from OP Visit from 07/13/2019 in [place] Admission (Discharged) from OP Visit from 06/11/2019 in [place] Admission (Discharged) from OP Visit from 05/29/2019 in [place] [facility]  Alcohol Use Disorder Identification Test Final Score (AUDIT) 0 0 '4 3 3      '$ GAD-7    Flowsheet Row Clinical  Support from 07/05/2020 in [facility] [place] at [facility] Office Visit from 05/19/2018 in [place] for [facility] Procedure visit from 03/18/2018 in [place] for [facility] Office Visit from 01/12/2018 in [place] for [facility] Procedure visit from 01/13/2017 in [place] for [facility]  Total GAD-7 Score '1 6 7 5 1      '$ PHQ2-9    [place] ED from 10/24/2021 in [place] from 07/05/2020 in [facility] [place] at [facility] ED from 05/26/2020 in [place] DEPT ED from 05/22/2020 in [facility] ED from 04/11/2020 in [facility]  PHQ-2 Total Score '2 1 4 4 3  '$ PHQ-9 Total Score '7 4 18 19 15      '$ SBQ-R    Flowsheet Row Counselor from 10/17/2019 in [facility]  SBQ-R Total Score [place] ED from 10/25/2021 in [facility] Most recent reading at 10/25/2021  1:54 PM ED from 10/24/2021 in [place] DEPT Most recent reading at 10/24/2021 10:50 PM ED from 10/24/2021 in [facility] Most recent reading at 10/24/2021 11:07 AM  C-SSRS RISK CATEGORY Low Risk No Risk Error: Q3, 4, or 5 should not be populated when Q2  is No        Musculoskeletal  Strength & Muscle Tone: within normal limits Gait & Station: normal Patient leans: N/A  Psychiatric Specialty Exam  Presentation  General Appearance: Casual  Eye Contact:Fair  Speech:Clear and Coherent; Normal Rate  Speech Volume:Normal  Handedness:Right   Mood and Affect  Mood:Depressed  Affect:Congruent   Thought Process  Thought Processes:Coherent  Descriptions of Associations:Intact  Orientation:Full (Time, Place and Person)  Thought Content:Logical  Diagnosis of Schizophrenia or Schizoaffective disorder in past: Yes  Duration of Psychotic Symptoms: Greater than six months   Hallucinations:Hallucinations: None  Ideas of Reference:None  Suicidal Thoughts:Suicidal Thoughts: Yes, Active SI Active Intent and/or Plan: With Intent; With Plan; With Means to [place]  Homicidal Thoughts:Homicidal Thoughts: No   Sensorium  Memory:Immediate Good; Recent Good; Remote Good  Judgment:Poor  Insight:Lacking   Executive Functions  Concentration:Good  Attention Span:Good  [place] of Knowledge:Good  Language:Good   Psychomotor Activity  Psychomotor Activity:Psychomotor Activity: Normal   Assets  Assets:Communication Skills; Desire for Improvement; Physical Health; Resilience   Sleep  Sleep:Sleep: Fair   Nutritional Assessment (For OBS and FBC admissions only) Has the patient had a weight loss or gain of 10 pounds or more in the last 3 months?: No Has the patient had a decrease in food intake/or appetite?: No Does the patient have dental problems?: No Does the patient have eating habits or behaviors that may be indicators of an eating disorder including binging or inducing vomiting?: No Has the patient recently lost weight without trying?: 0 Has the patient been eating poorly because of a decreased appetite?: 0 Malnutrition Screening Tool Score: 0    Physical Exam  Physical Exam Vitals and nursing  note reviewed.  Constitutional:      General: She is not in acute distress.    Appearance: Normal appearance. She is not ill-appearing.  HENT:     Head: Normocephalic.  Eyes:     General:        Right eye: No discharge.        Left  eye: No discharge.     Conjunctiva/sclera: Conjunctivae normal.     Pupils: Pupils are equal, round, and reactive to light.  Cardiovascular:     Rate and Rhythm: Normal rate.  Pulmonary:     Effort: Pulmonary effort is normal. No respiratory distress.  Musculoskeletal:        General: Normal range of motion.     Cervical back: Normal range of motion.  Skin:    General: Skin is warm and dry.     Coloration: Skin is not jaundiced or pale.  Neurological:     Mental Status: She is alert and oriented to person, place, and time.  Psychiatric:        Attention and Perception: Attention and perception normal.        Mood and Affect: Affect normal. Mood is depressed.        Speech: Speech normal.        Behavior: Behavior is cooperative.        Thought Content: Thought content includes suicidal ideation. Thought content includes suicidal plan.        Cognition and Memory: Cognition normal.        Judgment: Judgment is impulsive.   Review of Systems  Constitutional: Negative.   HENT: Negative.    Eyes: Negative.   Respiratory: Negative.    Cardiovascular: Negative.   Musculoskeletal: Negative.   Skin: Negative.   Neurological: Negative.   Psychiatric/Behavioral:  Positive for depression and suicidal ideas.    Blood pressure 133/84, pulse 83, temperature 98.1 F (36.7 C), temperature source Oral, resp. rate 18, last menstrual period 09/29/2021, SpO2 98 %. There is no height or weight on file to calculate BMI.  Treatment Plan Summary: Disposition: Ongoing.  Patient meets criteria for inpatient psychiatric admission.  [facility] notified and there is no bed availability.  Social work notified and patient has been faxed out daily contact with patient to  assess and evaluate symptoms and progress in treatment and Medication management.   Lithium 450 mg twice daily Home medication restarted.   Revonda Humphrey, NP 10/26/2021 8:39 AM

## 2021-10-26 NOTE — Progress Notes (Signed)
Per Darrol Angel, NP, patient meets criteria for inpatient treatment. There are no available beds at [facility] today. CSW faxed referrals to the following facilities for review:  [address] [place] [address] [phone] --  [place] [address] [place] [address] [phone] --  [facility]  Pending - Request Sent N/A [address] [place] [address] [phone] --  [facility] [address] [place] [address] [phone] --  [address] [place] [address] [phone] --  [place]  Pending - Request Sent N/A [place], [place] [address] [phone] --  [place] [address] [place], [place] [address] [phone] --  [place] [address] [place] [address] [phone] --  [facility]  Pending - Request Sent N/A [address] [place] [address] [place] [address] [place] [address] [phone] --  [facility]  Pending - Request Sent N/A [address] [place] [address] [phone] --  [facility]  Pending - Request Sent N/A [address], [place] [address] [phone] --  [facility]  Pending - Request Sent N/A [place], [place] [address] [phone] --  [facility]  Pending - Request Sent N/A [address] [place] [address] [phone] --  [facility]  Pending - Request Sent N/A [address] [place] [address] [phone] --  [facility]  Pending - Request Sent N/A [address] [place] [address] [phone] --   TTS will continue to seek bed placement.  Glennie Isle, MSW, [place] Phone: [phone] Disposition/TOC
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
## 2021-10-26 NOTE — ED Notes (Signed)
Pt was given a sub, chips, and juice for lunch.

## 2021-10-26 NOTE — Progress Notes (Addendum)
Per [facility] admissions, pt has been accepted to [facility]. Accepting provider is Dr. Mallie Darting. Patient can arrive 10/27/2021 after 10:00am. Number for report is [phone]. It should be noted that admissions requested if the patient does not agree to come, to please call to provide update.    Glennie Isle, MSW, LCSW-A Phone: [phone] Disposition/TOC

## 2021-10-26 NOTE — ED Notes (Signed)
Pt presents with depressed mood, affect congruent. [name] reports she continues to feel depressed and suicidal and states '' I still feel the same '' this am . Patient still reports she feels she needs inpatient treatment and asks if a bed has been found for her. Reiterated no placement secured at this time. [place] behaviors have been calm, appropriate and no incidents of inappropriate behavior. She has been compliant with medications. Pt is safe, will con't to monitor.

## 2021-10-26 NOTE — ED Notes (Signed)
Pt sleeping at present, no distress noted. Respirations even & unlabored.  Monitoring for safety. 

## 2021-10-26 NOTE — ED Notes (Signed)
Pt was given a hot tv dinner for dinner.

## 2021-10-26 NOTE — ED Notes (Signed)
Received patient this PM. Patient is at window interacting with staff. No acute distress noted. Patient respirations are even and unlabored. Will continue to monitor for safety.

## 2021-10-27 LAB — GLUCOSE, CAPILLARY
Glucose-Capillary: 144 mg/dL — ABNORMAL HIGH (ref 70–99)
Glucose-Capillary: 158 mg/dL — ABNORMAL HIGH (ref 70–99)

## 2021-10-27 MED ORDER — DOXYCYCLINE HYCLATE 100 MG PO TABS: 100.0000 mg | ORAL_TABLET | Freq: Two times a day (BID) | ORAL | Status: DC

## 2021-10-27 NOTE — Discharge Instructions (Addendum)
Transfer to [facility]. Patient under IVC and will transported via [employer].

## 2021-10-27 NOTE — ED Provider Notes (Addendum)
FBC/OBS ASAP Discharge Summary  Date and Time: 10/27/2021 2:13 PM  Name: [name]  MRN:  [id number]   Discharge Diagnoses:  Final diagnoses:  Suicidal ideation    Subjective: [name] 31 y.o., female patient who initially presented to [facility] on 10/25/2021 related to an attempted overdose. She was admitted to the continuous assessment while waiting IP psychiatric admission.  [name], 31 y.o., female patient seen face to face by this provider, consulted with Dr. Serafina Mitchell; and chart reviewed on 10/27/21. Per chart review patient has a history of schizoaffective disorder, SI and multiple suicide attempts with significant overdoses.  She has ACT team in place.  During evaluation [name] is in no acute distress.  She is alert/oriented x4 and cooperative.  She continues to endorse depression.  Reports she has slept well on the unit.  She denies HI/AVH. She continues to endorse suicidal ideations with a plan to overdose.  She cannot contract for safety.  She has normal speech.  She has normal behavior, but when discussing inpatient admission outside of [facility] she becomes anxious.  Discussed inpatient admission and explained that she has been accepted to [facility].  Initially she refused to be admitted to [facility] due to a bad experience there in the past.  The last time she was admitted she states the police left her at a bus station upon discharge.  Patient has a history of multiple serious suicide attempts.  She meets criteria for involuntary commitment.  IVC was completed.  Explained to patient involuntary commitment process and that she would be transported via law enforcement to [facility].  She was in agreement and was compliant.    Stay Summary: Patient continues to meet IP psychiatric admission criteria. She has been accepted to [facility]. She is refusing to be admitted to this facility. She meets involuntary commitment  criteria. IVC completed. Patient will be transported via safe transport.   Total Time spent with patient: 30 minutes  Past Psychiatric History: See H&P Past Medical History:  Past Medical History:  Diagnosis Date   Anxiety    Bipolar 1 disorder ([place])    Cognitive deficits    Depression    Diabetes mellitus without complication ([place])    Hypertension    Mental disorder    Mental health disorder    Obesity     Past Surgical History:  Procedure Laterality Date   CESAREAN SECTION     CESAREAN SECTION N/A 04/25/2013   Procedure: REPEAT CESAREAN SECTION;  Surgeon: [name], MD;  Location: [facility];  Service: Obstetrics;  Laterality: N/A;   MASS EXCISION N/A 06/03/2012   Procedure: EXCISION MASS;  Surgeon: [name], MD;  Location: [facility];  Service: ENT;  Laterality: N/A;  Excision uvula mass   TONSILLECTOMY N/A 06/03/2012   Procedure: TONSILLECTOMY;  Surgeon: [name], MD;  Location: [facility];  Service: ENT;  Laterality: N/A;   TONSILLECTOMY     Family History:  Family History  Problem Relation Age of Onset   Hypertension Mother    Diabetes Father    Family Psychiatric History: See H&P Social History:  Social History   Substance and Sexual Activity  Alcohol Use Not Currently     Social History   Substance and Sexual Activity  Drug Use Not Currently   Types: "Crack" cocaine, Other-see comments   Comment: Patient reports hx of smoking Crack    Social  History   Socioeconomic History   Marital status: Single    Spouse name: Not on file   Number of children: Not on file   Years of education: Not on file   Highest education level: Not on file  Occupational History   Not on file  Tobacco Use   Smoking status: Every Day    Types: Cigars   Smokeless tobacco: Never   Tobacco comments:    Pt declined  Vaping Use   Vaping Use: Never used  Substance and Sexual Activity   Alcohol use: Not Currently   Drug use: Not  Currently    Types: "Crack" cocaine, Other-see comments    Comment: Patient reports hx of smoking Crack   Sexual activity: Not Currently    Birth control/protection: None    Comment: occasionally  Other Topics Concern   Not on file  Social History Narrative   ** Merged History Encounter **       Social Determinants of Health   Financial Resource Strain: Not on file  Food Insecurity: Not on file  Transportation Needs: Not on file  Physical Activity: Not on file  Stress: Not on file  Social Connections: Not on file   SDOH:  SDOH Screenings   Alcohol Screen: Low Risk  (05/02/2020)   Alcohol Screen    Last Alcohol Screening Score (AUDIT): 3  Depression (PHQ2-9): Medium Risk (10/24/2021)   Depression (PHQ2-9)    PHQ-2 Score: 7  Financial Resource Strain: Not on file  Food Insecurity: Not on file  Housing: Not on file  Physical Activity: Not on file  Social Connections: Not on file  Stress: Not on file  Tobacco Use: High Risk (10/24/2021)   Patient History    Smoking Tobacco Use: Every Day    Smokeless Tobacco Use: Never    Passive Exposure: Not on file  Transportation Needs: Not on file    Tobacco Cessation:  N/A, patient does not currently use tobacco products  Current Medications:  Current Facility-Administered Medications  Medication Dose Route Frequency Provider Last Rate Last Admin   acetaminophen (TYLENOL) tablet 650 mg  650 mg Oral Q6H PRN White, Patrice L, NP       alum & mag hydroxide-simeth (MAALOX/MYLANTA) 200-200-20 MG/5ML suspension 30 mL  30 mL Oral Q4H PRN White, Patrice L, NP       amLODipine (NORVASC) tablet 5 mg  5 mg Oral Daily White, Patrice L, NP   5 mg at 10/27/21 9417   benztropine (COGENTIN) tablet 0.5 mg  0.5 mg Oral BID White, Patrice L, NP   0.5 mg at 10/27/21 4081   doxycycline (VIBRA-TABS) tablet 100 mg  100 mg Oral BID White, Patrice L, NP   100 mg at 10/27/21 0930   glimepiride (AMARYL) tablet 4 mg  4 mg Oral Q breakfast White, Patrice L, NP    4 mg at 10/27/21 0921   haloperidol (HALDOL) tablet 10 mg  10 mg Oral QHS White, Patrice L, NP   10 mg at 10/26/21 2107   hydrOXYzine (VISTARIL) capsule 50 mg  50 mg Oral QHS PRN White, Patrice L, NP       ibuprofen (ADVIL) tablet 400 mg  400 mg Oral Q6H PRN White, Patrice L, NP       insulin detemir (LEVEMIR) injection 40 Units  40 Units Subcutaneous BID White, Patrice L, NP   40 Units at 10/27/21 0921   lithium carbonate (ESKALITH) CR tablet 450 mg  450 mg Oral BID Chana Bode,  Victory Dakin, NP   450 mg at 10/27/21 1610   magnesium hydroxide (MILK OF MAGNESIA) suspension 30 mL  30 mL Oral Daily PRN White, Patrice L, NP       metoprolol tartrate (LOPRESSOR) tablet 12.5 mg  12.5 mg Oral BID White, Patrice L, NP   12.5 mg at 10/27/21 9604   pantoprazole (PROTONIX) EC tablet 40 mg  40 mg Oral Daily Revonda Humphrey, NP   40 mg at 10/27/21 5409   simvastatin (ZOCOR) tablet 20 mg  20 mg Oral Daily White, Patrice L, NP   20 mg at 10/27/21 8119   Current Outpatient Medications  Medication Sig Dispense Refill   benztropine (COGENTIN) 0.5 MG tablet Take 0.5 mg by mouth 2 (two) times daily.     Clobetasol Propionate 0.05 % shampoo Apply 1 application. topically daily.     guanFACINE (INTUNIV) 1 MG TB24 ER tablet Take 1 mg by mouth at bedtime.     haloperidol (HALDOL) 5 MG tablet Take 10 mg by mouth at bedtime.     haloperidol decanoate (HALDOL DECANOATE) 100 MG/ML injection Inject 150 mg into the muscle every 28 (twenty-eight) days.     simvastatin (ZOCOR) 20 MG tablet Take 20 mg by mouth daily.     albuterol (VENTOLIN HFA) 108 (90 Base) MCG/ACT inhaler Inhale 1-2 puffs into the lungs every 6 (six) hours as needed for wheezing or shortness of breath. 18 g 0   amLODipine (NORVASC) 5 MG tablet Take 5 mg by mouth daily.     doxycycline (VIBRA-TABS) 100 MG tablet Take 1 tablet (100 mg total) by mouth 2 (two) times daily.     glimepiride (AMARYL) 4 MG tablet Take 1 tablet (4 mg total) by mouth daily with  breakfast.     hydrOXYzine (VISTARIL) 50 MG capsule Take 50 mg by mouth at bedtime as needed for anxiety.     insulin detemir (LEVEMIR) 100 UNIT/ML injection Inject 0.4 mLs (40 Units total) into the skin 2 (two) times daily. 10 mL 11   lithium carbonate (ESKALITH) 450 MG CR tablet Take 1 tablet (450 mg total) by mouth 2 (two) times daily. 60 tablet 0   metoprolol tartrate (LOPRESSOR) 25 MG tablet Take 0.5 tablets (12.5 mg total) by mouth 2 (two) times daily. 30 tablet 0   ondansetron (ZOFRAN) 4 MG tablet Take 1 tablet (4 mg total) by mouth every 6 (six) hours. 12 tablet 0   pantoprazole (PROTONIX) 40 MG tablet Take 1 tablet (40 mg total) by mouth daily. 30 tablet 0   PROZAC 40 MG capsule Take 40 mg by mouth daily.      PTA Medications: (Not in a hospital admission)      10/24/2021   11:07 AM 07/11/2020    8:18 AM 05/26/2020    3:56 PM  Depression screen PHQ 2/9  Decreased Interest 1 0 2  Down, Depressed, Hopeless '1 1 2  '$ PHQ - 2 Score '2 1 4  '$ Altered sleeping 1 0 2  Tired, decreased energy '1 1 2  '$ Change in appetite 0 0 2  Feeling bad or failure about yourself  1 0 2  Trouble concentrating '1 2 2  '$ Moving slowly or fidgety/restless 0 0 2  Suicidal thoughts 1 0 2  PHQ-9 Score '7 4 18  '$ Difficult doing work/chores Somewhat difficult  Very difficult    Flowsheet Row ED [facility]25/2021 in [facility] Most recent reading at 10/27/2021  9:43 AM ED from 10/24/2021  in [place] DEPT Most recent reading at 10/24/2021 10:50 PM ED [facility] 10/24/20[facility] in [facility] Most recent reading at 10/24/2021 11:07 AM  C-SSRS RISK CATEGORY High Risk No Risk Error: Q3, 4, or 5 should not be populated when Q2 is No       Musculoskeletal  Strength & Muscle Tone: within normal limits Gait & Station: normal Patient leans: N/A  Psychiatric Specialty Exam  Presentation  General Appearance: Casual  Eye  Contact:Fair  Speech:Clear and Coherent; Normal Rate  Speech Volume:Normal  Handedness:Right   Mood and Affect  Mood:Depressed  Affect:Congruent   Thought Process  Thought Processes:Coherent  Descriptions of Associations:Intact  Orientation:Full (Time, Place and Person)  Thought Content:Logical  Diagnosis of Schizophrenia or Schizoaffective disorder in past: Yes  Duration of Psychotic Symptoms: Greater than six months   Hallucinations:Hallucinations: None  Ideas of Reference:None  Suicidal Thoughts:Suicidal Thoughts: Yes, Active SI Active Intent and/or Plan: With Intent; With Plan; With Means to [place]  Homicidal Thoughts:No data recorded  Sensorium  Memory:Immediate Good; Recent Good; Remote Good  Judgment:Poor  Insight:Lacking   Executive Functions  Concentration:Good  Attention Span:Good  [place]  Language:Good   Psychomotor Activity  Psychomotor Activity:Psychomotor Activity: Normal   Assets  Assets:Communication Skills; Desire for Improvement; Financial Resources/Insurance; Social Support; Resilience; Physical Health   Sleep  Sleep:Sleep: Good   No data recorded  Physical Exam  Physical Exam Vitals and nursing note reviewed.  Constitutional:      General: She is not in acute distress.    Appearance: Normal appearance. She is not ill-appearing.  HENT:     Head: Normocephalic.  Eyes:     General:        Right eye: No discharge.        Left eye: No discharge.     Conjunctiva/sclera: Conjunctivae normal.  Cardiovascular:     Rate and Rhythm: Normal rate.  Pulmonary:     Effort: Pulmonary effort is normal. No respiratory distress.  Musculoskeletal:        General: Normal range of motion.     Cervical back: Normal range of motion.  Skin:    Coloration: Skin is not jaundiced or pale.  Neurological:     Mental Status: She is alert and oriented to person, place, and time.  Psychiatric:         Attention and Perception: Attention and perception normal.        Mood and Affect: Affect normal. Mood is anxious and depressed.        Speech: Speech normal.        Behavior: Behavior is cooperative.        Thought Content: Thought content includes suicidal ideation. Thought content includes suicidal plan.        Cognition and Memory: Cognition normal.        Judgment: Judgment is impulsive.    Review of Systems  Constitutional: Negative.   HENT: Negative.    Eyes: Negative.   Respiratory: Negative.    Cardiovascular: Negative.   Musculoskeletal: Negative.   Skin: Negative.   Neurological: Negative.   Psychiatric/Behavioral:  Positive for depression and suicidal ideas. The patient is nervous/anxious.    Blood pressure 123/84, pulse 98, temperature 98 F (36.7 C), resp. rate 18, last menstrual period 09/29/2021, SpO2 100 %. There is no height or weight on file to calculate BMI.  Demographic Factors:  Low socioeconomic status and Unemployed  Loss Factors: Financial problems/change  in socioeconomic status  Historical Factors: Prior suicide attempts and Impulsivity  Risk Reduction Factors:   Living with another person, especially a relative and Positive social support  Continued Clinical Symptoms:  Severe Anxiety and/or Agitation Bipolar Disorder:   Depressive phase Depression:   Hopelessness Impulsivity Severe Schizophrenia:   Depressive state Previous Psychiatric Diagnoses and Treatments  Cognitive Features That Contribute To Risk:  None    Suicide Risk:  Severe:  Frequent, intense, and enduring suicidal ideation, specific plan, no subjective intent, but some objective markers of intent (i.e., choice of lethal method), the method is accessible, some limited preparatory behavior, evidence of impaired self-control, severe dysphoria/symptomatology, multiple risk factors present, and few if any protective factors, particularly a lack of social support.  Plan Of  Care/Follow-up recommendations:  Activity:  as tolerated  Diet:  regular   Disposition:   Discharge and transfer patient to [facility] for IP psychiatric admission.   IVC placed. Patient will be served and transported by [facility]  Revonda Humphrey, NP 10/27/2021, 2:13 PM

## 2021-10-27 NOTE — ED Notes (Signed)
Refuse morning vitals

## 2021-10-27 NOTE — ED Notes (Signed)
Report called to [facility].  [employer] served IVC paperwork.  Pt is pending [employer] department transportation.

## 2021-11-10 ENCOUNTER — Other Ambulatory Visit: Payer: Self-pay

## 2021-11-10 ENCOUNTER — Encounter (HOSPITAL_COMMUNITY): Payer: Self-pay

## 2021-11-10 ENCOUNTER — Emergency Department (HOSPITAL_COMMUNITY)
Admission: EM | Admit: 2021-11-10 | Discharge: 2021-11-10 | Discharge status: Against Medical Advice | Payer: Medicaid Other | Attending: Emergency Medicine | Admitting: Emergency Medicine

## 2021-11-10 DIAGNOSIS — L292 Pruritus vulvae: Secondary | ICD-10-CM | POA: Diagnosis not present

## 2021-11-10 DIAGNOSIS — R1110 Vomiting, unspecified: Secondary | ICD-10-CM | POA: Insufficient documentation

## 2021-11-10 DIAGNOSIS — Z5321 Procedure and treatment not carried out due to patient leaving prior to being seen by health care provider: Secondary | ICD-10-CM | POA: Insufficient documentation

## 2021-11-10 NOTE — ED Notes (Addendum)
Pt states she needs to be seen and out of the hospital by a certain time. Explained to her the wait. She elected to leave without being seen.[place] PA aware of pt leaving

## 2021-11-10 NOTE — ED Triage Notes (Signed)
Per EMS- Patient c/o vomiting and vaginal itching.

## 2021-11-12 ENCOUNTER — Telehealth (HOSPITAL_COMMUNITY): Payer: Self-pay | Admitting: Physician Assistant

## 2021-11-12 NOTE — BH Assessment (Signed)
Care Management - Follow Up [facility] Discharges   Patient has been placed in an inpatient psychiatric hospital [facility]) on 10-27-2021.

## 2021-11-21 ENCOUNTER — Emergency Department (HOSPITAL_COMMUNITY)
Admission: EM | Admit: 2021-11-21 | Discharge: 2021-11-21 | Discharge status: Against Medical Advice | Payer: Medicaid Other | Attending: Emergency Medicine | Admitting: Emergency Medicine

## 2021-11-21 DIAGNOSIS — R5383 Other fatigue: Secondary | ICD-10-CM | POA: Diagnosis present

## 2021-11-21 DIAGNOSIS — Z5321 Procedure and treatment not carried out due to patient leaving prior to being seen by health care provider: Secondary | ICD-10-CM | POA: Diagnosis not present

## 2021-11-21 NOTE — ED Triage Notes (Signed)
Pt reports that she has been more sleepy over the last few days. States that her meds were recently increased.

## 2021-11-26 ENCOUNTER — Emergency Department (HOSPITAL_COMMUNITY)
Admission: EM | Admit: 2021-11-26 | Discharge: 2021-11-26 | Discharge status: Against Medical Advice | Payer: Medicaid Other

## 2021-11-26 NOTE — ED Notes (Signed)
I called patient name several times in the lobby

## 2021-11-28 ENCOUNTER — Other Ambulatory Visit: Payer: Self-pay

## 2021-11-28 ENCOUNTER — Emergency Department (HOSPITAL_COMMUNITY)
Admission: EM | Admit: 2021-11-28 | Discharge: 2021-11-30 | Disposition: A | Discharge status: Other Acute Inpatient Hospital | Payer: Medicaid Other | Attending: Emergency Medicine | Admitting: Emergency Medicine

## 2021-11-28 DIAGNOSIS — Z046 Encounter for general psychiatric examination, requested by authority: Secondary | ICD-10-CM | POA: Insufficient documentation

## 2021-11-28 DIAGNOSIS — Z79899 Other long term (current) drug therapy: Secondary | ICD-10-CM | POA: Insufficient documentation

## 2021-11-28 DIAGNOSIS — R45851 Suicidal ideations: Secondary | ICD-10-CM | POA: Insufficient documentation

## 2021-11-28 DIAGNOSIS — F329 Major depressive disorder, single episode, unspecified: Secondary | ICD-10-CM | POA: Diagnosis present

## 2021-11-28 DIAGNOSIS — Z7984 Long term (current) use of oral hypoglycemic drugs: Secondary | ICD-10-CM | POA: Diagnosis not present

## 2021-11-28 DIAGNOSIS — Z20822 Contact with and (suspected) exposure to covid-19: Secondary | ICD-10-CM | POA: Diagnosis not present

## 2021-11-28 DIAGNOSIS — Z794 Long term (current) use of insulin: Secondary | ICD-10-CM | POA: Insufficient documentation

## 2021-11-28 DIAGNOSIS — F209 Schizophrenia, unspecified: Secondary | ICD-10-CM | POA: Diagnosis present

## 2021-11-28 DIAGNOSIS — F332 Major depressive disorder, recurrent severe without psychotic features: Secondary | ICD-10-CM | POA: Diagnosis not present

## 2021-11-28 LAB — CBC WITH DIFFERENTIAL/PLATELET
Abs Immature Granulocytes: 0.06 10*3/uL (ref 0.00–0.07)
Basophils Absolute: 0.1 10*3/uL (ref 0.0–0.1)
Basophils Relative: 1 %
Eosinophils Absolute: 0.4 10*3/uL (ref 0.0–0.5)
Eosinophils Relative: 3 %
HCT: 34.9 % — ABNORMAL LOW (ref 36.0–46.0)
Hemoglobin: 11 g/dL — ABNORMAL LOW (ref 12.0–15.0)
Immature Granulocytes: 0 %
Lymphocytes Relative: 26 %
Lymphs Abs: 3.8 10*3/uL (ref 0.7–4.0)
MCH: 25.3 pg — ABNORMAL LOW (ref 26.0–34.0)
MCHC: 31.5 g/dL (ref 30.0–36.0)
MCV: 80.2 fL (ref 80.0–100.0)
Monocytes Absolute: 0.8 10*3/uL (ref 0.1–1.0)
Monocytes Relative: 5 %
Neutro Abs: 9.6 10*3/uL — ABNORMAL HIGH (ref 1.7–7.7)
Neutrophils Relative %: 65 %
Platelets: 268 10*3/uL (ref 150–400)
RBC: 4.35 MIL/uL (ref 3.87–5.11)
RDW: 15.8 % — ABNORMAL HIGH (ref 11.5–15.5)
WBC: 14.7 10*3/uL — ABNORMAL HIGH (ref 4.0–10.5)
nRBC: 0 % (ref 0.0–0.2)

## 2021-11-28 LAB — COMPREHENSIVE METABOLIC PANEL
ALT: 18 U/L (ref 0–44)
AST: 19 U/L (ref 15–41)
Albumin: 3.6 g/dL (ref 3.5–5.0)
Alkaline Phosphatase: 50 U/L (ref 38–126)
Anion gap: 5 (ref 5–15)
BUN: 6 mg/dL (ref 6–20)
CO2: 27 mmol/L (ref 22–32)
Calcium: 9 mg/dL (ref 8.9–10.3)
Chloride: 107 mmol/L (ref 98–111)
Creatinine, Ser: 0.9 mg/dL (ref 0.44–1.00)
GFR, Estimated: >60 mL/min (ref >60)
Glucose, Bld: 181 mg/dL — ABNORMAL HIGH (ref 70–99)
Potassium: 3.7 mmol/L (ref 3.5–5.1)
Sodium: 139 mmol/L (ref 135–145)
Total Bilirubin: 0.4 mg/dL (ref 0.3–1.2)
Total Protein: 6.8 g/dL (ref 6.5–8.1)

## 2021-11-28 LAB — RAPID URINE DRUG SCREEN, HOSP PERFORMED
Amphetamines: NOT DETECTED
Barbiturates: NOT DETECTED
Benzodiazepines: NOT DETECTED
Cocaine: NOT DETECTED
Opiates: NOT DETECTED
Tetrahydrocannabinol: NOT DETECTED

## 2021-11-28 LAB — RESP PANEL BY RT-PCR (FLU A&B, COVID) ARPGX2
Influenza A by PCR: NEGATIVE
Influenza B by PCR: NEGATIVE
SARS Coronavirus 2 by RT PCR: NEGATIVE

## 2021-11-28 LAB — I-STAT BETA HCG BLOOD, ED (MC, WL, AP ONLY): I-stat hCG, quantitative: <5 m[IU]/mL (ref <5)

## 2021-11-28 LAB — CBG MONITORING, ED: Glucose-Capillary: 221 mg/dL — ABNORMAL HIGH (ref 70–99)

## 2021-11-28 LAB — ETHANOL: Alcohol, Ethyl (B): <10 mg/dL (ref <10)

## 2021-11-28 MED ORDER — BENZTROPINE MESYLATE 1 MG PO TABS
0.5000 mg | ORAL_TABLET | Freq: Two times a day (BID) | ORAL | Status: DC
Start: 2021-11-28 — End: 2021-11-30
  Administered 2021-11-28 – 2021-11-30 (×4): 0.5 mg via ORAL
  Filled 2021-11-28 (×5): qty 1

## 2021-11-28 MED ORDER — ALBUTEROL SULFATE HFA 108 (90 BASE) MCG/ACT IN AERS: 1.0000 | INHALATION_SPRAY | Freq: Four times a day (QID) | RESPIRATORY_TRACT | Status: DC | PRN

## 2021-11-28 MED ORDER — INSULIN ASPART 100 UNIT/ML IJ SOLN
0.0000 [IU] | Freq: Three times a day (TID) | INTRAMUSCULAR | Status: DC
  Administered 2021-11-29: 3 [IU] via SUBCUTANEOUS
  Administered 2021-11-29: 5 [IU] via SUBCUTANEOUS
  Administered 2021-11-29 – 2021-11-30 (×3): 3 [IU] via SUBCUTANEOUS

## 2021-11-28 MED ORDER — GUANFACINE HCL ER 1 MG PO TB24
1.0000 mg | ORAL_TABLET | Freq: Every day | ORAL | Status: DC
  Administered 2021-11-28 – 2021-11-29 (×2): 1 mg via ORAL
  Filled 2021-11-28 (×4): qty 1

## 2021-11-28 MED ORDER — AMLODIPINE BESYLATE 5 MG PO TABS
5.0000 mg | ORAL_TABLET | Freq: Every day | ORAL | Status: DC
  Administered 2021-11-28 – 2021-11-30 (×3): 5 mg via ORAL
  Filled 2021-11-28 (×3): qty 1

## 2021-11-28 MED ORDER — LORAZEPAM 1 MG PO TABS
2.0000 mg | ORAL_TABLET | Freq: Once | ORAL | Status: AC
  Administered 2021-11-28: 2 mg via ORAL
  Filled 2021-11-28: qty 2

## 2021-11-28 MED ORDER — HALOPERIDOL 5 MG PO TABS
10.0000 mg | ORAL_TABLET | Freq: Every day | ORAL | Status: DC
  Administered 2021-11-28 – 2021-11-29 (×2): 10 mg via ORAL
  Filled 2021-11-28 (×2): qty 2

## 2021-11-28 MED ORDER — INSULIN ASPART 100 UNIT/ML IJ SOLN: 0.0000 [IU] | Freq: Every day | INTRAMUSCULAR | Status: DC

## 2021-11-28 NOTE — ED Provider Notes (Signed)
[facility] EMERGENCY DEPARTMENT Provider Note   CSN: [id number] Arrival date & time: 11/28/21  1746     History  Chief Complaint  Patient presents with   Suicidal    [name] is a 31 y.o. female.  Patient here with suicidal thoughts.  She states that she can take pills and kill herself.  She states that she is not That she will end up back in here in the ICU.  Denies any chest pain, shortness of breath.  Denies any numbness, weakness, chills.  Nuys taking any pills or alcohol or drugs at this time.  The history is provided by the patient.       Home Medications Prior to Admission medications   Medication Sig Start Date End Date Taking? Authorizing Provider  albuterol (VENTOLIN HFA) 108 (90 Base) MCG/ACT inhaler Inhale 1-2 puffs into the lungs every 6 (six) hours as needed for wheezing or shortness of breath. 01/11/21   Pearson Forster, NP  amLODipine (NORVASC) 5 MG tablet Take 5 mg by mouth daily.    [provider]  benztropine (COGENTIN) 0.5 MG tablet Take 0.5 mg by mouth 2 (two) times daily.    [provider]  Clobetasol Propionate 0.05 % shampoo Apply 1 application. topically daily.    [provider]  doxycycline (VIBRA-TABS) 100 MG tablet Take 1 tablet (100 mg total) by mouth 2 (two) times daily. 10/27/21   Revonda Humphrey, NP  glimepiride (AMARYL) 4 MG tablet Take 1 tablet (4 mg total) by mouth daily with breakfast. 09/30/21   Revonda Humphrey, NP  guanFACINE (INTUNIV) 1 MG TB24 ER tablet Take 1 mg by mouth at bedtime.    [provider]  haloperidol (HALDOL) 5 MG tablet Take 10 mg by mouth at bedtime.    [provider]  haloperidol decanoate (HALDOL DECANOATE) 100 MG/ML injection Inject 150 mg into the muscle every 28 (twenty-eight) days.    [provider]  hydrOXYzine (VISTARIL) 50 MG capsule Take 50 mg by mouth at bedtime as needed for anxiety.    [provider]  insulin detemir  (LEVEMIR) 100 UNIT/ML injection Inject 0.4 mLs (40 Units total) into the skin 2 (two) times daily. 10/14/21   Delfin Gant, NP  lithium carbonate (ESKALITH) 450 MG CR tablet Take 1 tablet (450 mg total) by mouth 2 (two) times daily. 10/14/21 11/13/21  Delfin Gant, NP  metoprolol tartrate (LOPRESSOR) 25 MG tablet Take 0.5 tablets (12.5 mg total) by mouth 2 (two) times daily. 10/14/21 11/13/21  Delfin Gant, NP  ondansetron (ZOFRAN) 4 MG tablet Take 1 tablet (4 mg total) by mouth every 6 (six) hours. 10/20/21   Valarie Merino, MD  pantoprazole (PROTONIX) 40 MG tablet Take 1 tablet (40 mg total) by mouth daily. 10/15/21 11/14/21  Delfin Gant, NP  PROZAC 40 MG capsule Take 40 mg by mouth daily. 05/12/21   [provider]  simvastatin (ZOCOR) 20 MG tablet Take 20 mg by mouth daily.    [provider]      Allergies    Wellbutrin [bupropion], Omnipaque [iohexol], Contrast media [iodinated contrast media], Depakote [divalproex sodium], Hydroxyzine, and Penicillin g    Review of Systems   Review of Systems  Physical Exam Updated Vital Signs BP (!) 133/98 (BP Location: Right Arm)   Pulse 96   Temp 98.8 F (37.1 C) (Oral)   Resp 16   SpO2 98%  Physical Exam Vitals and  nursing note reviewed.  Constitutional:      General: She is not in acute distress.    Appearance: She is well-developed.  HENT:     Head: Normocephalic and atraumatic.  Eyes:     Conjunctiva/sclera: Conjunctivae normal.  Cardiovascular:     Rate and Rhythm: Normal rate and regular rhythm.     Heart sounds: No murmur heard. Pulmonary:     Effort: Pulmonary effort is normal. No respiratory distress.     Breath sounds: Normal breath sounds.  Abdominal:     Palpations: Abdomen is soft.     Tenderness: There is no abdominal tenderness.  Musculoskeletal:        General: No swelling.     Cervical back: Neck supple.  Skin:    General: Skin is warm and dry.     Capillary Refill:  Capillary refill takes less than 2 seconds.  Neurological:     Mental Status: She is alert.  Psychiatric:     Comments: Suicidal with plan     ED Results / Procedures / Treatments   Labs (all labs ordered are listed, but only abnormal results are displayed) Labs Reviewed  CBC WITH DIFFERENTIAL/PLATELET - Abnormal; Notable for the following components:      Result Value   WBC 14.7 (*)    Hemoglobin 11.0 (*)    HCT 34.9 (*)    MCH 25.3 (*)    RDW 15.8 (*)    Neutro Abs 9.6 (*)    All other components within normal limits  COMPREHENSIVE METABOLIC PANEL - Abnormal; Notable for the following components:   Glucose, Bld 181 (*)    All other components within normal limits  RESP PANEL BY RT-PCR (FLU A&B, COVID) ARPGX2  ETHANOL  RAPID URINE DRUG SCREEN, HOSP PERFORMED  I-STAT BETA HCG BLOOD, ED ([facility], [facility], [facility] ONLY)  I-STAT BETA HCG BLOOD, ED ([facility], [facility], [facility] ONLY)    EKG EKG Interpretation  Date/Time:  Friday November 28 2021 19:35:44 EDT Ventricular Rate:  89 PR Interval:  178 QRS Duration: 84 QT Interval:  364 QTC Calculation: 442 R Axis:   85 Text Interpretation: Normal sinus rhythm Normal ECG When compared with ECG of 25-Oct-2021 13:29, PREVIOUS ECG IS PRESENT Confirmed by Lennice Sites ([id number]) on 11/28/2021 7:38:39 PM  Radiology No results found.  Procedures Procedures    Medications Ordered in ED Medications  LORazepam (ATIVAN) tablet 2 mg (2 mg Oral Given 11/28/21 1849)    ED Course/ Medical Decision Making/ A&P                           Medical Decision Making  [place] is here with suicidal ideation.  IVC filled in triage.  Normal vitals.  No fever.  History of depression, bipolar, hypertension, diabetes.  No physical complaints.  She tells me that she is going to kill herself by overdosing on her pills.  She states she needs help.  She states that she does not get help she will overdose and be in the ICU.  Physically she appears well.  Medical clearance labs have  been ordered.  Home medications to be ordered.  Per my review and interpretation of labs there is no significant anemia, electrolyte abnormality, kidney injury.  Patient medically cleared for psychiatry evaluation.  Awaiting TTS.  Home medications have been ordered.  Patient under IVC.  This chart was dictated using voice recognition software.  Despite best efforts to proofread,  errors can occur which can change the documentation meaning.         Final Clinical Impression(s) / ED Diagnoses Final diagnoses:  Suicidal ideation    Rx / DC Orders ED Discharge Orders     None         Lennice Sites, DO 11/28/21 2105

## 2021-11-28 NOTE — BH Assessment (Addendum)
As of 19:44 there is no provider note.  Pt also has been given 2 mg Ativan at 18:49.  TTS to see patient after there is a provider note and pt is alert.

## 2021-11-28 NOTE — ED Notes (Signed)
Patient being wanded by security, valuables placed in safe by security, belongings inventoried and placed in locker 3.

## 2021-11-28 NOTE — ED Provider Triage Note (Signed)
Emergency Medicine Provider Triage Evaluation Note  [name] , a 31 y.o. female  was evaluated in triage.  Pt complains of " my mother keeps saying there is a demon on me."  Patient is screaming and yelling and triage.  She is agitated.  She is throwing up due to her anxiety.  She is endorsing suicidal ideations as well as homicidal ideations.  When asked if she is having thoughts about hurting others she states "that is between me and the devil, doll asked those questions." Denies visual hallucinations.   Review of Systems  Positive:  Negative:   Physical Exam  There were no vitals taken for this visit. -Unable to obtain vitals due to patient agitation Gen:   Awake, no distress   Resp:  Normal effort  MSK:   Moves extremities without difficulty  Other:  Agitated, pacing, appears to be responding intermittently to voice.   Medical Decision Making  Medically screening exam initiated at 6:39 PM.  Appropriate orders placed.  [name] was informed that the remainder of the evaluation will be completed by another provider, this initial triage assessment does not replace that evaluation, and the importance of remaining in the ED until their evaluation is complete.  We will need room, IVC paperwork initiated   Bing Matter 11/28/21 1840

## 2021-11-28 NOTE — ED Notes (Addendum)
Patient not currently dressed in purple scrubs. Patient inquired as to when she would be assessed by the psychiatrist.

## 2021-11-28 NOTE — ED Triage Notes (Signed)
Pt states she is here because she needs help and we "dont ever help her"  "my mother keeps saying there's a demon on me"  Pt screaming. Endorsing desire to hurt herself and when asked about hurting others she says "that's between me and the devil"  Pt screaming and vomiting on the walls.  Hysterical . Not able to be redirected.

## 2021-11-28 NOTE — ED Provider Notes (Signed)
First form and IVC Petition paperwork has been filled out.    Mickie Hillier, PA-C 11/28/21 [place], [place], DO 11/28/21 1939

## 2021-11-28 NOTE — ED Notes (Signed)
Patient currently changing into scrubs. Belongings at nurses station.

## 2021-11-29 ENCOUNTER — Encounter (HOSPITAL_COMMUNITY): Payer: Self-pay

## 2021-11-29 DIAGNOSIS — F332 Major depressive disorder, recurrent severe without psychotic features: Secondary | ICD-10-CM

## 2021-11-29 LAB — CBG MONITORING, ED
Glucose-Capillary: 209 mg/dL — ABNORMAL HIGH (ref 70–99)
Glucose-Capillary: 155 mg/dL — ABNORMAL HIGH (ref 70–99)
Glucose-Capillary: 178 mg/dL — ABNORMAL HIGH (ref 70–99)
Glucose-Capillary: 197 mg/dL — ABNORMAL HIGH (ref 70–99)

## 2021-11-29 MED ORDER — LORAZEPAM 1 MG PO TABS
1.0000 mg | ORAL_TABLET | Freq: Four times a day (QID) | ORAL | Status: DC | PRN
  Administered 2021-11-29: 1 mg via ORAL
  Filled 2021-11-29: qty 1

## 2021-11-29 NOTE — ED Provider Notes (Signed)
Emergency Medicine Observation Re-evaluation Note  [name] is a 31 y.o. female, seen on rounds today.  Pt initially presented to the ED for complaints of Suicidal Currently, the patient is patient presents for evaluation of agitated behavior and suicidal ideation.  Physical Exam  BP 123/65 (BP Location: Right Arm)   Pulse 83   Temp 98.5 F (36.9 C) (Oral)   Resp 18   SpO2 99%  Physical Exam General: Nontoxic appearing Cardiac: Normal heart rate Lungs: Normal respiratory rate Psych: No internal responsiveness  ED Course / MDM  EKG:EKG Interpretation  Date/Time:  Friday November 28 2021 19:35:44 EDT Ventricular Rate:  89 PR Interval:  178 QRS Duration: 84 QT Interval:  364 QTC Calculation: 442 R Axis:   85 Text Interpretation: Normal sinus rhythm Normal ECG When compared with ECG of 25-Oct-2021 13:29, PREVIOUS ECG IS PRESENT Confirmed by Lennice Sites ([id number]) on 11/28/2021 7:38:39 PM  I have reviewed the labs performed to date as well as medications administered while in observation.  Recent changes in the last 24 hours include she has remained fairly cooperative.  Plan  Current plan is for psychiatric admission. [name] is under involuntary commitment.      Daleen Bo, MD 11/29/21 2011

## 2021-11-29 NOTE — ED Notes (Addendum)
Spoke w/ Shirlee Limerick RN at [facility] about pt being transported to [facility]. Per Shirlee Limerick RN, the bed will still be available tomorrow. Pt can't be transported tonight d/t [employer] doesn't transport after 5pm. Notified Shirlee Limerick RN, Eulis Foster MD, Jerelene Redden NP and Kathleen Argue.

## 2021-11-29 NOTE — ED Notes (Signed)
Pt resting with eyes closed; respirations spontaneous, even, unlabored; sitter at bedside

## 2021-11-29 NOTE — BH Assessment (Signed)
Per Irine Seal, RN pt was given Haldol, currently sleeping and unable to engage in TTS assessment. Clinician asked RN to notify  TTS once pt is alert and able to engage.    Vertell Novak, [place], [facility], [facility] Triage Specialist [phone]

## 2021-11-29 NOTE — ED Notes (Signed)
Pt upset about going to [facility]. Pt ambulated back to room and laid down on the bed.

## 2021-11-29 NOTE — Progress Notes (Signed)
Clara with [facility] contacted CSW inquiring about this patient to review for possible placement. CSW provided nurse's contact information for further review.  Glennie Isle, MSW, [facility] Phone: [phone] Disposition/TOC

## 2021-11-29 NOTE — Consult Note (Addendum)
Telepsych Consultation   Reason for Consult:  Psychiatric Reassessment for SI Referring Physician:  Bing Matter Location of Patient:    [name] Location of Provider: Other: virtual home office  Patient Identification: Jolena Kittle MRN:  [id number] Principal Diagnosis: MDD (major depressive disorder) Diagnosis:  Principal Problem:   MDD (major depressive disorder) Active Problems:   Schizophrenia ([place])   Total Time spent with patient: 30 minutes  Subjective:   Malaiyah Gustava Berland is a 31 y.o. female patient admitted with suicidal ideations and a plan to overdose on medications.  HPI:   Patient seen via telepsych by this provider; chart reviewed and consulted with Dr. Dwyane Dee on 11/29/21.  On evaluation [place] reports, "My momma keeps saying there are demons  in me. I'm not sure if she keeps saying this or if it's all in my head."   Pt states these comments make her angry because she does not know how to process it.  She reports being caught off guard with her mother comments and denies recent argument or conflict with her mother.  Pt states 3-4 months ago she started visiting her mother and sleeping over for the weekends.  Recently her mother told her she cannot move in and told her to return to her apartment.  Patient denies wanting to live with her mother and reports her mother as "mean and insensitive to my feelings."  Of note, Ms. Tatro states her mother takes medication for mental illness and acknowledges she could be dealing with her own personal concerns.  Patient also states she broke up with her boyfriend one week ago, and is very sad about this. States he uses drugs and it is not an environment she wants to deal with.     She endorses taking her medications as prescribed, denies missed doses.  Within the past week, after the breakup she reports hypersomnia, decreased appetite and ruminating thoughts of self-harm. Pt states, "I'll take those pills and end up  in the IVC if you don't believe me."  Also has invasive thoughts of negative self-worth, and describes symptoms of disassociation d/t being overwhelmed.    She currently lives alone at an apartment in [place], [place].  She reports being followed by [facility], cannot recall the last time she spoke with them. Pt reports they came and checked on her but she is unsure of when this was.  States she does not feel they are as helpful because, "I continues to have demons in me."   Pt continues to endorses suicidal ideations with plan to overdose on pills if discharged home today.  She denies homicidal ideations, and audible or visual hallucinations.  Patient has already been evaluated and medically cleared prior to psych assessment.   Per ED Provider Admission Assessment dated 11/28/2021: Chief Complaint  Patient presents with   Suicidal      Lashaye Serrena Linderman is a 31 y.o. female.   Patient here with suicidal thoughts.  She states that she can take pills and kill herself.  She states that she is not That she will end up back in here in the ICU.  Denies any chest pain, shortness of breath.  Denies any numbness, weakness, chills.  Nuys taking any pills or alcohol or drugs at this time.   The history is provided by the patient.     Past Psychiatric History: Schizophrenia. MDD, Learning Disability, prior suicide attempt via overdose, impulse control disorder.  Risk to Self:  yes Risk to Others:  no Prior Inpatient Therapy: yes  Prior Outpatient Therapy:  yes, has [facility]  Past Medical History:  Past Medical History:  Diagnosis Date   Anxiety    Bipolar 1 disorder ([place])    Cognitive deficits    Depression    Diabetes mellitus without complication ([place])    Hypertension    Mental disorder    Mental health disorder    Obesity     Past Surgical History:  Procedure Laterality Date   CESAREAN SECTION     CESAREAN SECTION N/A 04/25/2013   Procedure:  REPEAT CESAREAN SECTION;  Surgeon: [name], MD;  Location: [facility];  Service: Obstetrics;  Laterality: N/A;   MASS EXCISION N/A 06/03/2012   Procedure: EXCISION MASS;  Surgeon: [name], MD;  Location: [facility];  Service: ENT;  Laterality: N/A;  Excision uvula mass   TONSILLECTOMY N/A 06/03/2012   Procedure: TONSILLECTOMY;  Surgeon: [name], MD;  Location: [facility];  Service: ENT;  Laterality: N/A;   TONSILLECTOMY     Family History:  Family History  Problem Relation Age of Onset   Hypertension Mother    Diabetes Father    Family Psychiatric  History: unknown Social History:  Social History   Substance and Sexual Activity  Alcohol Use Not Currently     Social History   Substance and Sexual Activity  Drug Use Not Currently   Types: "Crack" cocaine, Other-see comments   Comment: Patient reports hx of smoking Crack    Social History   Socioeconomic History   Marital status: Single    Spouse name: Not on file   Number of children: Not on file   Years of education: Not on file   Highest education level: Not on file  Occupational History   Not on file  Tobacco Use   Smoking status: Every Day    Types: Cigars   Smokeless tobacco: Never   Tobacco comments:    Pt declined  Vaping Use   Vaping Use: Never used  Substance and Sexual Activity   Alcohol use: Not Currently   Drug use: Not Currently    Types: "Crack" cocaine, Other-see comments    Comment: Patient reports hx of smoking Crack   Sexual activity: Not Currently    Birth control/protection: None    Comment: occasionally  Other Topics Concern   Not on file  Social History Narrative   ** Merged History Encounter **       Social Determinants of Health   Financial Resource Strain: Not on file  Food Insecurity: Not on file  Transportation Needs: Not on file  Physical Activity: Not on file  Stress: Not on file  Social Connections: Not on file   Additional  Social History:    Allergies:   Allergies  Allergen Reactions   Wellbutrin [Bupropion] Shortness Of Breath   Omnipaque [Iohexol] Swelling and Other (See Comments)    Eye swelling   Contrast Media [Iodinated Contrast Media] Other (See Comments)    Eyes swell up   Depakote [Divalproex Sodium] Other (See Comments)    Mouth sores. dizziness   Hydroxyzine Other (See Comments)    Causes hyperactivity, makes pt want to "fight"   Penicillin G Hives    Labs:  Results for orders placed or performed during the hospital encounter of 11/28/21 (from the past 48 hour(s))  Resp Panel by RT-PCR (Flu A&B, Covid)     Status: None  Collection Time: 11/28/21  6:41 PM   Specimen: Nasal Swab  Result Value Ref Range   SARS Coronavirus 2 by RT PCR NEGATIVE NEGATIVE    Comment: (NOTE) SARS-CoV-2 target nucleic acids are NOT DETECTED.  The SARS-CoV-2 RNA is generally detectable in upper respiratory specimens during the acute phase of infection. The lowest concentration of SARS-CoV-2 viral copies this assay can detect is 138 copies/mL. A negative result does not preclude SARS-Cov-2 infection and should not be used as the sole basis for treatment or other patient management decisions. A negative result may occur with  improper specimen collection/handling, submission of specimen other than nasopharyngeal swab, presence of viral mutation(s) within the areas targeted by this assay, and inadequate number of viral copies(<138 copies/mL). A negative result must be combined with clinical observations, patient history, and epidemiological information. The expected result is Negative.  Fact Sheet for Patients:  EntrepreneurPulse.com.au  Fact Sheet for Healthcare Providers:  IncredibleEmployment.be  This test is no t yet approved or cleared by the [place] FDA and  has been authorized for detection and/or diagnosis of SARS-CoV-2 by FDA under an Emergency Use  Authorization (EUA). This EUA will remain  in effect (meaning this test can be used) for the duration of the COVID-19 declaration under Section 564(b)(1) of the Act, 21 U.S.C.section 360bbb-3(b)(1), unless the authorization is terminated  or revoked sooner.       Influenza A by PCR NEGATIVE NEGATIVE   Influenza B by PCR NEGATIVE NEGATIVE    Comment: (NOTE) The Xpert Xpress SARS-CoV-2/FLU/RSV plus assay is intended as an aid in the diagnosis of influenza from Nasopharyngeal swab specimens and should not be used as a sole basis for treatment. Nasal washings and aspirates are unacceptable for Xpert Xpress SARS-CoV-2/FLU/RSV testing.  Fact Sheet for Patients: EntrepreneurPulse.com.au  Fact Sheet for Healthcare Providers: IncredibleEmployment.be  This test is not yet approved or cleared by the [place] FDA and has been authorized for detection and/or diagnosis of SARS-CoV-2 by FDA under an Emergency Use Authorization (EUA). This EUA will remain in effect (meaning this test can be used) for the duration of the COVID-19 declaration under Section 564(b)(1) of the Act, 21 U.S.C. section 360bbb-3(b)(1), unless the authorization is terminated or revoked.  Performed at [facility], [place] [address], [place], [place] [address]   Ethanol     Status: None   Collection Time: 11/28/21  6:52 PM  Result Value Ref Range   Alcohol, Ethyl (B) <10 <10 mg/dL    Comment: (NOTE) Lowest detectable limit for serum alcohol is 10 mg/dL.  For medical purposes only. Performed at [facility], [place] [address], [place], [place] [address]   CBC with Diff     Status: Abnormal   Collection Time: 11/28/21  6:52 PM  Result Value Ref Range   WBC 14.7 (H) 4.0 - 10.5 K/uL   RBC 4.35 3.87 - 5.11 MIL/uL   Hemoglobin 11.0 (L) 12.0 - 15.0 g/dL   HCT 34.9 (L) 36.0 - 46.0 %   MCV 80.2 80.0 - 100.0 fL   MCH 25.3 (L) 26.0 - 34.0 pg   MCHC 31.5 30.0 - 36.0 g/dL    RDW 15.8 (H) 11.5 - 15.5 %   Platelets 268 150 - 400 K/uL    Comment: REPEATED TO VERIFY   nRBC 0.0 0.0 - 0.2 %   Neutrophils Relative % 65 %   Neutro Abs 9.6 (H) 1.7 - 7.7 K/uL   Lymphocytes Relative 26 %  Lymphs Abs 3.8 0.7 - 4.0 K/uL   Monocytes Relative 5 %   Monocytes Absolute 0.8 0.1 - 1.0 K/uL   Eosinophils Relative 3 %   Eosinophils Absolute 0.4 0.0 - 0.5 K/uL   Basophils Relative 1 %   Basophils Absolute 0.1 0.0 - 0.1 K/uL   Immature Granulocytes 0 %   Abs Immature Granulocytes 0.06 0.00 - 0.07 K/uL    Comment: Performed at [facility], [place] [address], [place], [place] [address]  Urine rapid drug screen (hosp performed)     Status: None   Collection Time: 11/28/21  6:58 PM  Result Value Ref Range   Opiates NONE DETECTED NONE DETECTED   Cocaine NONE DETECTED NONE DETECTED   Benzodiazepines NONE DETECTED NONE DETECTED   Amphetamines NONE DETECTED NONE DETECTED   Tetrahydrocannabinol NONE DETECTED NONE DETECTED   Barbiturates NONE DETECTED NONE DETECTED    Comment: (NOTE) DRUG SCREEN FOR MEDICAL PURPOSES ONLY.  IF CONFIRMATION IS NEEDED FOR ANY PURPOSE, NOTIFY LAB WITHIN 5 DAYS.  LOWEST DETECTABLE LIMITS FOR URINE DRUG SCREEN Drug Class                     Cutoff (ng/mL) Amphetamine and metabolites    1000 Barbiturate and metabolites    200 Benzodiazepine                 811 Tricyclics and metabolites     300 Opiates and metabolites        300 Cocaine and metabolites        300 THC                            50 Performed at [facility], [place] [address], [place], [place] [address]   I-Stat beta hCG blood, ED     Status: None   Collection Time: 11/28/21  7:33 PM  Result Value Ref Range   I-stat hCG, quantitative <5.0 <5 mIU/mL   Comment 3            Comment:   GEST. AGE      CONC.  (mIU/mL)   <=1 WEEK        5 - 50     2 WEEKS       50 - 500     3 WEEKS       100 - 10,000     4 WEEKS     1,000 - 30,000        FEMALE AND NON-PREGNANT FEMALE:      LESS THAN 5 mIU/mL   Comprehensive metabolic panel     Status: Abnormal   Collection Time: 11/28/21  8:10 PM  Result Value Ref Range   Sodium 139 135 - 145 mmol/L   Potassium 3.7 3.5 - 5.1 mmol/L   Chloride 107 98 - 111 mmol/L   CO2 27 22 - 32 mmol/L   Glucose, Bld 181 (H) 70 - 99 mg/dL    Comment: Glucose reference range applies only to samples taken after fasting for at least 8 hours.   BUN 6 6 - 20 mg/dL   Creatinine, Ser 0.90 0.44 - 1.00 mg/dL   Calcium 9.0 8.9 - 10.3 mg/dL   Total Protein 6.8 6.5 - 8.1 g/dL   Albumin 3.6 3.5 - 5.0 g/dL   AST 19 15 - 41 U/L   ALT 18 0 - 44 U/L   Alkaline Phosphatase 50  38 - 126 U/L   Total Bilirubin 0.4 0.3 - 1.2 mg/dL   GFR, Estimated >60 >60 mL/min    Comment: (NOTE) Calculated using the CKD-EPI Creatinine Equation (2021)    Anion gap 5 5 - 15    Comment: Performed at [facility] [address], [place], [place] [address]  CBG monitoring, ED     Status: Abnormal   Collection Time: 11/28/21 10:02 PM  Result Value Ref Range   Glucose-Capillary 221 (H) 70 - 99 mg/dL    Comment: Glucose reference range applies only to samples taken after fasting for at least 8 hours.  CBG monitoring, ED     Status: Abnormal   Collection Time: 11/29/21  7:53 AM  Result Value Ref Range   Glucose-Capillary 209 (H) 70 - 99 mg/dL    Comment: Glucose reference range applies only to samples taken after fasting for at least 8 hours.    Medications:  Current Facility-Administered Medications  Medication Dose Route Frequency Provider Last Rate Last Admin   albuterol (VENTOLIN HFA) 108 (90 Base) MCG/ACT inhaler 1-2 puff  1-2 puff Inhalation Q6H PRN Curatolo, Adam, DO       amLODipine (NORVASC) tablet 5 mg  5 mg Oral Daily Curatolo, Adam, DO   5 mg at 11/29/21 0940   benztropine (COGENTIN) tablet 0.5 mg  0.5 mg Oral BID Curatolo, Adam, DO   0.5 mg at 11/29/21 0940   guanFACINE (INTUNIV) ER tablet 1 mg  1 mg Oral Daily Curatolo, Adam, DO   1 mg at 11/28/21  2159   haloperidol (HALDOL) tablet 10 mg  10 mg Oral QHS Curatolo, Adam, DO   10 mg at 11/28/21 2159   insulin aspart (novoLOG) injection 0-15 Units  0-15 Units Subcutaneous TID WC Curatolo, Adam, DO   5 Units at 11/29/21 0940   insulin aspart (novoLOG) injection 0-5 Units  0-5 Units Subcutaneous QHS Curatolo, Adam, DO       Current Outpatient Medications  Medication Sig Dispense Refill   albuterol (VENTOLIN HFA) 108 (90 Base) MCG/ACT inhaler Inhale 1-2 puffs into the lungs every 6 (six) hours as needed for wheezing or shortness of breath. 18 g 0   amLODipine (NORVASC) 5 MG tablet Take 5 mg by mouth daily.     Clobetasol Propionate 0.05 % shampoo Apply 1 application. topically daily.     glimepiride (AMARYL) 4 MG tablet Take 1 tablet (4 mg total) by mouth daily with breakfast.     haloperidol (HALDOL) 5 MG tablet Take 10 mg by mouth at bedtime.     haloperidol decanoate (HALDOL DECANOATE) 100 MG/ML injection Inject 150 mg into the muscle every 28 (twenty-eight) days.     hydrOXYzine (VISTARIL) 50 MG capsule Take 50 mg by mouth at bedtime as needed for anxiety.     insulin detemir (LEVEMIR) 100 UNIT/ML injection Inject 0.4 mLs (40 Units total) into the skin 2 (two) times daily. 10 mL 11   lithium carbonate (ESKALITH) 450 MG CR tablet Take 1 tablet (450 mg total) by mouth 2 (two) times daily. 60 tablet 0   metoprolol tartrate (LOPRESSOR) 25 MG tablet Take 0.5 tablets (12.5 mg total) by mouth 2 (two) times daily. 30 tablet 0   pantoprazole (PROTONIX) 40 MG tablet Take 1 tablet (40 mg total) by mouth daily. 30 tablet 0   simvastatin (ZOCOR) 20 MG tablet Take 20 mg by mouth daily.      Musculoskeletal: pt moves all extremities and ambulates independently Strength &  Muscle Tone: within normal limits Gait & Station: normal Patient leans: N/A   Psychiatric Specialty Exam:  Presentation  General Appearance: Casual  Eye Contact:Fair  Speech:Clear and Coherent; Normal Rate  Speech  Volume:Normal  Handedness:Right   Mood and Affect  Mood:Depressed; Irritable  Affect:Appropriate; Congruent; Constricted   Thought Process  Thought Processes:Coherent  Descriptions of Associations:Intact  Orientation:Full (Time, Place and Person)  Thought Content:Logical  History of Schizophrenia/Schizoaffective disorder:Yes  Duration of Psychotic Symptoms:Greater than six months  Hallucinations:Hallucinations: None  Ideas of Reference:None  Suicidal Thoughts:Suicidal Thoughts: Yes, Active SI Active Intent and/or Plan: With Intent; With Plan  Homicidal Thoughts:Homicidal Thoughts: No   Sensorium  Memory:Immediate Good; Recent Good; Remote Good  Judgment:Fair (at baseline is prone to impulsivity)  Insight:Fair   Executive Functions  Concentration:Fair  Attention Span:Fair  [place] of Knowledge:Good  Language:Good   Psychomotor Activity  Psychomotor Activity:Psychomotor Activity: Normal   Assets  Assets:Communication Skills; Housing   Sleep  Sleep:Sleep: Good Number of Hours of Sleep: 8    Physical Exam: Physical Exam Constitutional:      Appearance: She is obese.  Cardiovascular:     Rate and Rhythm: Normal rate.     Pulses: Normal pulses.  Pulmonary:     Effort: Pulmonary effort is normal.  Musculoskeletal:     Cervical back: Normal range of motion.  Neurological:     General: No focal deficit present.     Mental Status: She is alert and oriented to person, place, and time.  Psychiatric:        Attention and Perception: Perception normal. She is inattentive (appears preoccupied and sad, does not appear to be responding to internal stimulus).        Mood and Affect: Mood is anxious and depressed. Affect is blunt.        Speech: Speech normal.        Behavior: Behavior normal. Behavior is cooperative.        Thought Content: Thought content includes suicidal ideation. Thought content includes suicidal plan.         Cognition and Memory: Cognition and memory normal.        Judgment: Judgment is impulsive.    Review of Systems  Constitutional: Negative.   HENT: Negative.    Eyes: Negative.   Respiratory: Negative.    Cardiovascular: Negative.   Gastrointestinal: Negative.   Genitourinary: Negative.   Musculoskeletal: Negative.   Skin: Negative.   Neurological: Negative.   Endo/Heme/Allergies: Negative.   Psychiatric/Behavioral:  Positive for depression and suicidal ideas. The patient is nervous/anxious.    Blood pressure 125/82, pulse 99, temperature 98.5 F (36.9 C), temperature source Oral, resp. rate 17, SpO2 99 %. There is no height or weight on file to calculate BMI.  Treatment Plan Summary: Patient with hx of schizophrenia, MDD, presents to ED with suicidal ideations and plan to overdose on home medications.  Patient is triggered by personal losses, recent break up and familial discord.  Pt has hx of prior suicide attempt, she verbalizes limited coping skills, and is impulsive.  Based on above she is an acute high safety risk and would benefit from inpatient admissions where her medications can be adjusted to treat depressive symptoms and she can be monitored for mood stability and safety.  Above discussed with patient who agrees with recommendations and request inpatient care.  Pt states she does not want to go to [facility] or [facility].  Explained to pt SW to be made aware  of request but she may have to take first facility that accepts her.  Pt verbalizes understanding.  Daily contact with patient to assess and evaluate symptoms and progress in treatment and Medication management Home medications have already been restarted.   Disposition: Recommend psychiatric Inpatient admission when medically cleared.  This service was provided via telemedicine using a 2-way, interactive audio and video technology.   Spoke with Dr. Christ Kick; Sammuel Bailiff RN; Glennie Isle LCSW; informed of above  recommendation and disposition via secure chat.  Names of all persons participating in this telemedicine service and their role in this encounter. Name: Shuntia Exton Role: Patient  Name: Merlyn Lot Role: PMHNP   Mallie Darting, NP 11/29/2021 12:05 PM

## 2021-11-29 NOTE — Progress Notes (Addendum)
Per [facility] admissions,  pt has been accepted to [facility]. Accepting provider is Dr. Merrie Roof. Patient can arrive by anytime. Number for report is [phone].  Glennie Isle, MSW, LCSW-A Phone: [phone] Disposition/TOC

## 2021-11-29 NOTE — ED Notes (Signed)
[name] from [facility] called to ask questions about pt and reports they will accept the pt tomorrow. [name] SW updated that [name] will be calling them to notify them.

## 2021-11-29 NOTE — Progress Notes (Signed)
Per Merlyn Lot, NP, patient meets criteria for inpatient treatment. There are no available beds at [facility] today. CSW faxed referrals to the following facilities for review:  [facility]  Pending - No Request Sent N/A [address] [place] [address] [phone] --  [facility] N/A [address] [place] [address] [phone] --  [facility]  Pending - No Request Sent N/A [address] [place] [address] [phone] --  [facility]  Pending - No Request Sent N/A [address] [place] [address] [phone] --  [facility]  Pending - No Request Sent N/A [address] [place], [place] [address] [phone] --  [facility]  Pending - No Request Sent N/A [address] [place] [address] [place] --  [facility]  Pending - No Request Sent N/A [address] [place] [address] [facility]  Pending - No Request Sent N/A [address] [place] [address] [phone] --  [facility]  Pending - No Request Sent N/A [address] [place] [address] [phone] --  [facility]  Pending - No Request Sent N/A [address] [place] [address] [phone] --  [facility]  Pending - No Request Sent N/A [address] [place] [address] [phone] --  [facility]  Pending - No Request Sent N/A [address] [place] [place] [address] [phone] --  [facility]  Pending - No Request Sent N/A [address] [place] [address] [phone] --  [facility]   Pending - No Request Sent N/A [address] [place] [address] [phone] --   TTS will continue to seek bed placement.  Glennie Isle, MSW, [facility] Phone: [phone] Disposition/TOC

## 2021-11-29 NOTE — BH Assessment (Signed)
Clinician messaged Clerance Lav, RN: "Hey. It's [name] with [facility]. Is the pt able to engage in the assessment, if so the pt will need to be placed in a private room. Is the pt under IVC? Also is the pt medically cleared?"   Clinician awaiting response.    Vertell Novak, [place], [facility], [facility] Triage Specialist [phone]

## 2021-11-30 LAB — CBG MONITORING, ED
Glucose-Capillary: 196 mg/dL — ABNORMAL HIGH (ref 70–99)
Glucose-Capillary: 176 mg/dL — ABNORMAL HIGH (ref 70–99)

## 2021-11-30 NOTE — ED Notes (Signed)
Report given to Amelia Jo, RN of [facility] ([place]). Secretary called [employer] for transport to facility.

## 2021-11-30 NOTE — ED Provider Notes (Signed)
Emergency Medicine Observation Re-evaluation Note  [name] is a 31 y.o. female, seen on rounds today.  Pt initially presented to the ED for complaints of Suicidal Currently, the patient is resting comfortably.  Physical Exam  BP (!) 125/92 (BP Location: Left Arm)   Pulse 84   Temp 97.9 F (36.6 C) (Oral)   Resp 17   SpO2 100%  Physical Exam General: Nontoxic appearance Cardiac: Normal heart rate Lungs: Normal respiratory rate Psych: No internal responsiveness  ED Course / MDM  EKG:EKG Interpretation  Date/Time:  Friday November 28 2021 19:35:44 EDT Ventricular Rate:  89 PR Interval:  178 QRS Duration: 84 QT Interval:  364 QTC Calculation: 442 R Axis:   85 Text Interpretation: Normal sinus rhythm Normal ECG When compared with ECG of 25-Oct-2021 13:29, PREVIOUS ECG IS PRESENT Confirmed by Lennice Sites ([id number]) on 11/28/2021 7:38:39 PM  I have reviewed the labs performed to date as well as medications administered while in observation.  Recent changes in the last 24 hours include accepted at [facility] for treatment.  Plan  Current plan is for transfer by law enforcement. [name] is under involuntary commitment.      Daleen Bo, MD 11/30/21 [phone]

## 2021-12-11 ENCOUNTER — Encounter (HOSPITAL_COMMUNITY): Payer: Self-pay

## 2021-12-11 ENCOUNTER — Emergency Department (HOSPITAL_COMMUNITY)
Admission: EM | Admit: 2021-12-11 | Discharge: 2021-12-11 | Discharge status: Against Medical Advice | Payer: Medicaid Other | Attending: Emergency Medicine | Admitting: Emergency Medicine

## 2021-12-11 ENCOUNTER — Emergency Department (HOSPITAL_COMMUNITY): Payer: Medicaid Other

## 2021-12-11 DIAGNOSIS — R5383 Other fatigue: Secondary | ICD-10-CM | POA: Insufficient documentation

## 2021-12-11 DIAGNOSIS — F419 Anxiety disorder, unspecified: Secondary | ICD-10-CM | POA: Diagnosis not present

## 2021-12-11 DIAGNOSIS — Z5321 Procedure and treatment not carried out due to patient leaving prior to being seen by health care provider: Secondary | ICD-10-CM | POA: Diagnosis not present

## 2021-12-11 DIAGNOSIS — F32A Depression, unspecified: Secondary | ICD-10-CM | POA: Insufficient documentation

## 2021-12-11 DIAGNOSIS — R0602 Shortness of breath: Secondary | ICD-10-CM | POA: Insufficient documentation

## 2021-12-11 DIAGNOSIS — R0789 Other chest pain: Secondary | ICD-10-CM | POA: Diagnosis present

## 2021-12-11 DIAGNOSIS — R0981 Nasal congestion: Secondary | ICD-10-CM | POA: Insufficient documentation

## 2021-12-11 LAB — CBC WITH DIFFERENTIAL/PLATELET
Abs Immature Granulocytes: 0.03 10*3/uL (ref 0.00–0.07)
Basophils Absolute: 0.1 10*3/uL (ref 0.0–0.1)
Basophils Relative: 1 %
Eosinophils Absolute: 0.4 10*3/uL (ref 0.0–0.5)
Eosinophils Relative: 4 %
HCT: 36.5 % (ref 36.0–46.0)
Hemoglobin: 11.2 g/dL — ABNORMAL LOW (ref 12.0–15.0)
Immature Granulocytes: 0 %
Lymphocytes Relative: 29 %
Lymphs Abs: 2.6 10*3/uL (ref 0.7–4.0)
MCH: 24.8 pg — ABNORMAL LOW (ref 26.0–34.0)
MCHC: 30.7 g/dL (ref 30.0–36.0)
MCV: 80.8 fL (ref 80.0–100.0)
Monocytes Absolute: 0.4 10*3/uL (ref 0.1–1.0)
Monocytes Relative: 5 %
Neutro Abs: 5.3 10*3/uL (ref 1.7–7.7)
Neutrophils Relative %: 61 %
Platelets: 268 10*3/uL (ref 150–400)
RBC: 4.52 MIL/uL (ref 3.87–5.11)
RDW: 15.5 % (ref 11.5–15.5)
WBC: 8.8 10*3/uL (ref 4.0–10.5)
nRBC: 0 % (ref 0.0–0.2)

## 2021-12-11 LAB — COMPREHENSIVE METABOLIC PANEL
ALT: 20 U/L (ref 0–44)
AST: 20 U/L (ref 15–41)
Albumin: 4 g/dL (ref 3.5–5.0)
Alkaline Phosphatase: 53 U/L (ref 38–126)
Anion gap: 7 (ref 5–15)
BUN: 13 mg/dL (ref 6–20)
CO2: 27 mmol/L (ref 22–32)
Calcium: 8.9 mg/dL (ref 8.9–10.3)
Chloride: 107 mmol/L (ref 98–111)
Creatinine, Ser: 0.81 mg/dL (ref 0.44–1.00)
GFR, Estimated: >60 mL/min (ref >60)
Glucose, Bld: 184 mg/dL — ABNORMAL HIGH (ref 70–99)
Potassium: 3.6 mmol/L (ref 3.5–5.1)
Sodium: 141 mmol/L (ref 135–145)
Total Bilirubin: 0.6 mg/dL (ref 0.3–1.2)
Total Protein: 7.9 g/dL (ref 6.5–8.1)

## 2021-12-11 LAB — I-STAT BETA HCG BLOOD, ED (MC, WL, AP ONLY): I-stat hCG, quantitative: <5 m[IU]/mL (ref <5)

## 2021-12-11 LAB — TROPONIN I (HIGH SENSITIVITY): Troponin I (High Sensitivity): 3 ng/L (ref <18)

## 2021-12-11 NOTE — ED Provider Triage Note (Signed)
Emergency Medicine Provider Triage Evaluation Note  [name] , a 31 y.o. female  was evaluated in triage.  Pt complains of chest pain and shortness of breath.  Patient states that symptoms began last night.  She states that it woke her up from sleep.  Episode lasted about 8 minutes before she returned back to sleep with no difficulty.  She seems most concerned about persistent depression and anxiety after her recent discharge from behavioral health hospital.  She states she has not been sleeping well at night and has been excessively tired for the past 1 to 2 months.  She reports medication change after recent discharge that has not helped her symptoms.  She states she would like to go back to behavioral health when she is cleared medically.  Denies suicidal, homicidal ideation, auditory or visual hallucinations.  Review of Systems  Positive: See above Negative:   Physical Exam  BP (!) 134/99 (BP Location: Left Arm)   Pulse (!) 103   Temp 98.6 F (37 C) (Oral)   Resp 18   Ht '5\' 7"'$  (1.702 m)   Wt 117.9 kg   SpO2 99%   BMI 40.72 kg/m  Gen:   Awake, no distress   Resp:  Normal effort  MSK:   Moves extremities without difficulty  Other:  No murmurs gallops or rubs auscultated.  No obvious wheeze rales or rhonchi.  No lower extremity edema noted.  Medical Decision Making  Medically screening exam initiated at 12:52 PM.  Appropriate orders placed.  Suzanne Silvanna Ohmer was informed that the remainder of the evaluation will be completed by another provider, this initial triage assessment does not replace that evaluation, and the importance of remaining in the ED until their evaluation is complete.    Wilnette Kales, [place] 12/11/21 1255

## 2021-12-11 NOTE — ED Triage Notes (Addendum)
Pt arrives via EMS from home. Pt reports congestion, fatigue, sob with exertion, and some intermittent chest discomfort for the past couple of days. Pt recently DC from a mental health facility. Reports she is still feeling some anxiety and depression. Pt denies SI or HI.

## 2021-12-12 ENCOUNTER — Encounter (HOSPITAL_COMMUNITY): Payer: Self-pay

## 2021-12-12 ENCOUNTER — Emergency Department (HOSPITAL_COMMUNITY)
Admission: EM | Admit: 2021-12-12 | Discharge: 2021-12-12 | Disposition: A | Discharge status: Home / Self Care | Payer: Medicaid Other | Attending: Emergency Medicine | Admitting: Emergency Medicine

## 2021-12-12 DIAGNOSIS — D72829 Elevated white blood cell count, unspecified: Secondary | ICD-10-CM | POA: Insufficient documentation

## 2021-12-12 DIAGNOSIS — D649 Anemia, unspecified: Secondary | ICD-10-CM | POA: Insufficient documentation

## 2021-12-12 DIAGNOSIS — R5383 Other fatigue: Secondary | ICD-10-CM | POA: Diagnosis not present

## 2021-12-12 DIAGNOSIS — Z794 Long term (current) use of insulin: Secondary | ICD-10-CM | POA: Diagnosis not present

## 2021-12-12 DIAGNOSIS — K1379 Other lesions of oral mucosa: Secondary | ICD-10-CM | POA: Diagnosis present

## 2021-12-12 LAB — CBC WITH DIFFERENTIAL/PLATELET
Abs Immature Granulocytes: 0.03 K/uL (ref 0.00–0.07)
Basophils Absolute: 0.1 K/uL (ref 0.0–0.1)
Basophils Relative: 1 %
Eosinophils Absolute: 0.5 K/uL (ref 0.0–0.5)
Eosinophils Relative: 4 %
HCT: 37.6 % (ref 36.0–46.0)
Hemoglobin: 11.5 g/dL — ABNORMAL LOW (ref 12.0–15.0)
Immature Granulocytes: 0 %
Lymphocytes Relative: 36 %
Lymphs Abs: 4.6 K/uL — ABNORMAL HIGH (ref 0.7–4.0)
MCH: 24.7 pg — ABNORMAL LOW (ref 26.0–34.0)
MCHC: 30.6 g/dL (ref 30.0–36.0)
MCV: 80.9 fL (ref 80.0–100.0)
Monocytes Absolute: 0.8 K/uL (ref 0.1–1.0)
Monocytes Relative: 6 %
Neutro Abs: 6.9 K/uL (ref 1.7–7.7)
Neutrophils Relative %: 53 %
Platelets: 313 K/uL (ref 150–400)
RBC: 4.65 MIL/uL (ref 3.87–5.11)
RDW: 15.9 % — ABNORMAL HIGH (ref 11.5–15.5)
WBC: 12.8 K/uL — ABNORMAL HIGH (ref 4.0–10.5)
nRBC: 0 % (ref 0.0–0.2)

## 2021-12-12 LAB — BASIC METABOLIC PANEL WITH GFR
Anion gap: 5 (ref 5–15)
BUN: 10 mg/dL (ref 6–20)
CO2: 27 mmol/L (ref 22–32)
Calcium: 9.5 mg/dL (ref 8.9–10.3)
Chloride: 112 mmol/L — ABNORMAL HIGH (ref 98–111)
Creatinine, Ser: 0.83 mg/dL (ref 0.44–1.00)
GFR, Estimated: >60 mL/min (ref >60)
Glucose, Bld: 107 mg/dL — ABNORMAL HIGH (ref 70–99)
Potassium: 3.7 mmol/L (ref 3.5–5.1)
Sodium: 144 mmol/L (ref 135–145)

## 2021-12-12 MED ORDER — LIDOCAINE VISCOUS HCL 2 % MT SOLN: 15.0000 mL | Freq: Three times a day (TID) | OROMUCOSAL | 0 refills | Status: DC | PRN

## 2021-12-12 NOTE — ED Provider Notes (Signed)
[facility] DEPT Provider Note   CSN: [id number] Arrival date & time: 12/12/21  1515     History  Chief Complaint  Patient presents with   Mouth Lesions   Fatigue    [name] is a 31 y.o. female.   Mouth Lesions  Patient is a 31 year old female presented emergency room today with complaints of some pain underneath her tongue for the past few weeks.  [name] states that for approximately 4 months [name] feels that [name] has been more fatigued.  [name] initially told provider in triage that [name] started a new medication 3 days ago and that [name] noticed the painful lesions after that initiation of the new bipolar medication however on my evaluation [name] seems to feel that it has been at least a week since her mouth pain started.  [name] denies any mouth trauma.  [name] denies any abnormal sexual encounters.  Denies any other areas of pain.  [name] denies any chest pain difficulty breathing lightheadedness or dizziness.  No other associated symptoms.    Home Medications Prior to Admission medications   Medication Sig Start Date End Date Taking? Authorizing Provider  magic mouthwash (lidocaine, diphenhydrAMINE, alum & mag hydroxide) suspension Swish and spit 15 mLs 3 (three) times daily as needed for mouth pain. 12/12/21  Yes Jemari Hallum S, PA  albuterol (VENTOLIN HFA) 108 (90 Base) MCG/ACT inhaler Inhale 1-2 puffs into the lungs every 6 (six) hours as needed for wheezing or shortness of breath. 01/11/21   Pearson Forster, NP  amLODipine (NORVASC) 5 MG tablet Take 5 mg by mouth daily.    [provider]  Clobetasol Propionate 0.05 % shampoo Apply 1 application. topically daily.    [provider]  glimepiride (AMARYL) 4 MG tablet Take 1 tablet (4 mg total) by mouth daily with breakfast. 09/30/21   Revonda Humphrey, NP  haloperidol (HALDOL) 5 MG tablet Take 10 mg by mouth at bedtime.    [provider]  haloperidol decanoate (HALDOL DECANOATE)  100 MG/ML injection Inject 150 mg into the muscle every 28 (twenty-eight) days.    [provider]  hydrOXYzine (VISTARIL) 50 MG capsule Take 50 mg by mouth at bedtime as needed for anxiety.    [provider]  insulin detemir (LEVEMIR) 100 UNIT/ML injection Inject 0.4 mLs (40 Units total) into the skin 2 (two) times daily. 10/14/21   Delfin Gant, NP  lithium carbonate (ESKALITH) 450 MG CR tablet Take 1 tablet (450 mg total) by mouth 2 (two) times daily. 10/14/21 01/31/22  Delfin Gant, NP  metoprolol tartrate (LOPRESSOR) 25 MG tablet Take 0.5 tablets (12.5 mg total) by mouth 2 (two) times daily. 10/14/21 01/31/22  Delfin Gant, NP  pantoprazole (PROTONIX) 40 MG tablet Take 1 tablet (40 mg total) by mouth daily. 10/15/21 01/31/22  Delfin Gant, NP  simvastatin (ZOCOR) 20 MG tablet Take 20 mg by mouth daily.    [provider]      Allergies    Wellbutrin [bupropion], Omnipaque [iohexol], Contrast media [iodinated contrast media], Depakote [divalproex sodium], Hydroxyzine, and Penicillin g    Review of Systems   Review of Systems  HENT:  Positive for mouth sores.     Physical Exam Updated Vital Signs BP 112/63 (BP Location: Left Arm)   Pulse 81   Temp 98.9 F (37.2 C) (Oral)   Resp 18   SpO2 100%  Physical Exam Vitals and nursing note reviewed.  Constitutional:  General: [name] is not in acute distress.    Appearance: Normal appearance. [name] is not ill-appearing.     Comments: 31 year old female in no acute distress  HENT:     Head: Normocephalic and atraumatic.     Mouth/Throat:     Comments: Small aphthous ulcer underneath the tongue on the oral mucosa.  No abnormal findings of the frenulum. Eyes:     General: No scleral icterus.       Right eye: No discharge.        Left eye: No discharge.     Conjunctiva/sclera: Conjunctivae normal.  Cardiovascular:     Rate and Rhythm: Normal rate.  Pulmonary:     Effort: Pulmonary effort  is normal.     Breath sounds: No stridor.  Abdominal:     General: Abdomen is flat.  Skin:    General: Skin is warm and dry.  Neurological:     Mental Status: [name] is alert and oriented to person, place, and time. Mental status is at baseline.     Comments: Moves all 4 extremities, smile symmetric, uvula midline Normal phonation     ED Results / Procedures / Treatments   Labs (all labs ordered are listed, but only abnormal results are displayed) Labs Reviewed  BASIC METABOLIC PANEL - Abnormal; Notable for the following components:      Result Value   Chloride 112 (*)    Glucose, Bld 107 (*)    All other components within normal limits  CBC WITH DIFFERENTIAL/PLATELET - Abnormal; Notable for the following components:   WBC 12.8 (*)    Hemoglobin 11.5 (*)    MCH 24.7 (*)    RDW 15.9 (*)    Lymphs Abs 4.6 (*)    All other components within normal limits  TSH    EKG None  Radiology DG Chest Port 1 View  Result Date: 12/11/2021 CLINICAL DATA:  Chest pain EXAM: PORTABLE CHEST 1 VIEW COMPARISON:  Radiograph 08/29/2021 FINDINGS: The cardiomediastinal silhouette is within normal limits. There is no focal airspace consolidation. There is no pleural effusion. No pneumothorax. There is no acute osseous abnormality. IMPRESSION: No evidence of acute cardiopulmonary disease. Electronically Signed   By: Maurine Simmering M.D.   On: 12/11/2021 13:23    Procedures Procedures    Medications Ordered in ED Medications - No data to display  ED Course/ Medical Decision Making/ A&P                           Medical Decision Making   Patient is a 31 year old female presented emergency room today with complaints of some pain underneath her tongue for the past few weeks.  [name] states that for approximately 4 months [name] feels that [name] has been more fatigued.  [name] initially told provider in triage that [name] started a new medication 3 days ago and that [name] noticed the painful lesions after that  initiation of the new bipolar medication however on my evaluation [name] seems to feel that it has been at least a week since her mouth pain started.  [name] denies any mouth trauma.  [name] denies any abnormal sexual encounters.  Denies any other areas of pain.  [name] denies any chest pain difficulty breathing lightheadedness or dizziness.  No other associated symptoms.  Physical exam is unremarkable  Overall well patient is well-appearing with normal vital signs.  [name] is morbidly obese but it seems that [name] is talk to her PCP  about this.  BMP unremarkable.  CBC with no specific findings very mild anemia.  Mild leukocytosis with some an elevated lymphocyte count consistent with viral illness which may be the cause of her aphthous ulcer.  Recommend close follow-up with PCP.  Return precautions discussed.  [name] states that [name] is not concerned for pregnancy.  TSH was collected but not completed.  [name] can follow-up with [place] via PCP.  Return precautions discussed again and [name] is agreeable to plan.  Ambulates without difficulty on her way out and is primarily asking about food and ginger ale.  I feel generally reassured by her presentation however [name] certainly has in need of close PCP follow-up with a--according to my review of prior charts--[name] is not very consistent with follow-up.  Final Clinical Impression(s) / ED Diagnoses Final diagnoses:  Other fatigue  Mouth sore    Rx / DC Orders ED Discharge Orders          Ordered    magic mouthwash (lidocaine, diphenhydrAMINE, alum & mag hydroxide) suspension  3 times daily PRN        12/12/21 1959              Tedd Sias, [place] 12/12/21 2049    Hayden Rasmussen, MD 12/13/21 1559

## 2021-12-12 NOTE — ED Provider Triage Note (Signed)
Emergency Medicine Provider Triage Evaluation Note  Analyssa [name] , a 31 y.o. female  was evaluated in triage.  Pt complains of painful oral lesions and fatigue.  Patient reports she recently started a new medication for bipolar about a week ago.  She reports that 3 days ago she noticed painful lesions on her upper lip and on the frenulum under her tongue.  No difficulty swallowing.  She also reports that she has been very sleepy and fatigued despite resting, constantly feels tired.  Review of Systems  Positive: Fatigue, oral lesions Negative: Syncope, chest pain, shortness of breath  Physical Exam  BP 112/63 (BP Location: Left Arm)   Pulse 81   Temp 98.9 F (37.2 C) (Oral)   Resp 18   SpO2 100%  Gen:   Awake, no distress   Resp:  Normal effort  MSK:   Moves extremities without difficulty  Other:  Few vesicular appearing lesions to the left side of the upper lip, and some irritation and redness of the frenulum  Medical Decision Making  Medically screening exam initiated at 5:16 PM.  Appropriate orders placed.  [name] was informed that the remainder of the evaluation will be completed by another provider, this initial triage assessment does not replace that evaluation, and the importance of remaining in the ED until their evaluation is complete.     Jacqlyn Larsen, [place] 12/12/21 1725

## 2021-12-12 NOTE — Discharge Instructions (Addendum)
I have given you a medication called Magic mouthwash which you can swish and spit up to 3 times daily as needed for mouth pain.  Please follow-up with your primary care provider  Please specifically call [phone]

## 2021-12-12 NOTE — ED Triage Notes (Signed)
Pt arrived via POV, c/o dental sores, sore on lip and being fatigued all the time.

## 2021-12-15 ENCOUNTER — Other Ambulatory Visit: Payer: Self-pay

## 2021-12-15 ENCOUNTER — Emergency Department (HOSPITAL_COMMUNITY)
Admission: EM | Admit: 2021-12-15 | Discharge: 2021-12-16 | Discharge status: Against Medical Advice | Payer: Medicaid Other | Attending: Emergency Medicine | Admitting: Emergency Medicine

## 2021-12-15 ENCOUNTER — Encounter (HOSPITAL_COMMUNITY): Payer: Self-pay | Admitting: Emergency Medicine

## 2021-12-15 DIAGNOSIS — R531 Weakness: Secondary | ICD-10-CM | POA: Diagnosis not present

## 2021-12-15 DIAGNOSIS — Z5321 Procedure and treatment not carried out due to patient leaving prior to being seen by health care provider: Secondary | ICD-10-CM | POA: Insufficient documentation

## 2021-12-15 DIAGNOSIS — R109 Unspecified abdominal pain: Secondary | ICD-10-CM | POA: Insufficient documentation

## 2021-12-15 DIAGNOSIS — D72829 Elevated white blood cell count, unspecified: Secondary | ICD-10-CM | POA: Diagnosis not present

## 2021-12-15 DIAGNOSIS — R42 Dizziness and giddiness: Secondary | ICD-10-CM | POA: Diagnosis present

## 2021-12-15 DIAGNOSIS — N939 Abnormal uterine and vaginal bleeding, unspecified: Secondary | ICD-10-CM | POA: Insufficient documentation

## 2021-12-15 DIAGNOSIS — E876 Hypokalemia: Secondary | ICD-10-CM | POA: Diagnosis not present

## 2021-12-15 DIAGNOSIS — R112 Nausea with vomiting, unspecified: Secondary | ICD-10-CM | POA: Insufficient documentation

## 2021-12-15 LAB — I-STAT BETA HCG BLOOD, ED (MC, WL, AP ONLY): I-stat hCG, quantitative: <5 m[IU]/mL (ref <5)

## 2021-12-15 LAB — COMPREHENSIVE METABOLIC PANEL
ALT: 17 U/L (ref 0–44)
AST: 18 U/L (ref 15–41)
Albumin: 3.9 g/dL (ref 3.5–5.0)
Alkaline Phosphatase: 49 U/L (ref 38–126)
Anion gap: 9 (ref 5–15)
BUN: 8 mg/dL (ref 6–20)
CO2: 24 mmol/L (ref 22–32)
Calcium: 9 mg/dL (ref 8.9–10.3)
Chloride: 105 mmol/L (ref 98–111)
Creatinine, Ser: 0.81 mg/dL (ref 0.44–1.00)
GFR, Estimated: >60 mL/min (ref >60)
Glucose, Bld: 152 mg/dL — ABNORMAL HIGH (ref 70–99)
Potassium: 3 mmol/L — ABNORMAL LOW (ref 3.5–5.1)
Sodium: 138 mmol/L (ref 135–145)
Total Bilirubin: 0.6 mg/dL (ref 0.3–1.2)
Total Protein: 7.1 g/dL (ref 6.5–8.1)

## 2021-12-15 LAB — CBC
HCT: 34.8 % — ABNORMAL LOW (ref 36.0–46.0)
Hemoglobin: 11 g/dL — ABNORMAL LOW (ref 12.0–15.0)
MCH: 25.1 pg — ABNORMAL LOW (ref 26.0–34.0)
MCHC: 31.6 g/dL (ref 30.0–36.0)
MCV: 79.3 fL — ABNORMAL LOW (ref 80.0–100.0)
Platelets: 245 10*3/uL (ref 150–400)
RBC: 4.39 MIL/uL (ref 3.87–5.11)
RDW: 15.7 % — ABNORMAL HIGH (ref 11.5–15.5)
WBC: 11.9 10*3/uL — ABNORMAL HIGH (ref 4.0–10.5)
nRBC: 0 % (ref 0.0–0.2)

## 2021-12-15 LAB — LIPASE, BLOOD: Lipase: 26 U/L (ref 11–51)

## 2021-12-15 NOTE — ED Provider Triage Note (Signed)
Emergency Medicine Provider Triage Evaluation Note  [name] , a 31 y.o. female  was evaluated in triage.  Pt complains of lightheadedness, dizziness, nausea, vomiting, "feeling very sick".  Ports since last time she was seen and evaluated she is having worsening dizziness, lightheadedness.  She has been seen and evaluated frequently in the emergency department, and has a care plan in place.  Review of Systems  Positive: Weakness, nausea, vomiting, dizziness, lightheadedness Negative: Abdominal pain, fever, chills, chest pain, shortness of breath  Physical Exam  There were no vitals taken for this visit. Gen:   Awake, no distress   Resp:  Normal effort  MSK:   Moves extremities without difficulty  Other:    Medical Decision Making  Medically screening exam initiated at 8:25 PM.  Appropriate orders placed.  Aminah Reesa Gotschall was informed that the remainder of the evaluation will be completed by another provider, this initial triage assessment does not replace that evaluation, and the importance of remaining in the ED until their evaluation is complete.  Workup initiated   Anselmo Pickler, [place] 12/15/21 2026

## 2021-12-15 NOTE — ED Triage Notes (Signed)
Patient reports lightheaded/dizziness with emesis this week .

## 2021-12-16 ENCOUNTER — Encounter (HOSPITAL_COMMUNITY): Payer: Self-pay

## 2021-12-16 ENCOUNTER — Ambulatory Visit: Payer: Medicaid Other

## 2021-12-16 ENCOUNTER — Emergency Department (HOSPITAL_COMMUNITY)
Admission: EM | Admit: 2021-12-16 | Discharge: 2021-12-16 | Discharge status: Against Medical Advice | Payer: Medicaid Other | Source: Home / Self Care

## 2021-12-16 DIAGNOSIS — N939 Abnormal uterine and vaginal bleeding, unspecified: Secondary | ICD-10-CM | POA: Insufficient documentation

## 2021-12-16 DIAGNOSIS — E876 Hypokalemia: Secondary | ICD-10-CM | POA: Insufficient documentation

## 2021-12-16 DIAGNOSIS — R1110 Vomiting, unspecified: Secondary | ICD-10-CM | POA: Insufficient documentation

## 2021-12-16 DIAGNOSIS — Z5321 Procedure and treatment not carried out due to patient leaving prior to being seen by health care provider: Secondary | ICD-10-CM | POA: Insufficient documentation

## 2021-12-16 DIAGNOSIS — D72829 Elevated white blood cell count, unspecified: Secondary | ICD-10-CM | POA: Insufficient documentation

## 2021-12-16 DIAGNOSIS — R109 Unspecified abdominal pain: Secondary | ICD-10-CM | POA: Insufficient documentation

## 2021-12-16 MED ORDER — POTASSIUM CHLORIDE CRYS ER 20 MEQ PO TBCR
40.0000 meq | EXTENDED_RELEASE_TABLET | Freq: Once | ORAL | Status: AC
  Administered 2021-12-16: 40 meq via ORAL
  Filled 2021-12-16: qty 2

## 2021-12-16 MED ORDER — ONDANSETRON 4 MG PO TBDP
4.0000 mg | ORAL_TABLET | Freq: Once | ORAL | Status: AC
  Administered 2021-12-16: 4 mg via ORAL
  Filled 2021-12-16: qty 1

## 2021-12-16 NOTE — ED Provider Triage Note (Signed)
Emergency Medicine Provider Triage Evaluation Note  Day Greb , a 31 y.o. female  was evaluated in triage.  Pt complains of ongoing symptoms, abdominal pain, reports that since yesterday she is having some new vaginal discharge and vomiting.  She was seen evaluated in triage yesterday but did not stay for her evaluation.  Patient has a history of frequent emergency department visits.  I will not repeat her lab work today since it was just performed yesterday and she reports that she does not have significant new symptoms.  Patient endorses the only new symptoms are vomiting and vaginal discharge.  Her lab work from yesterday showed some mild leukocytosis white blood cells around 11.2, and mild hypokalemia with potassium 3.0.  She is in no acute distress today.  Review of Systems  Positive: As above Negative: As above  Physical Exam  BP (!) 157/84 (BP Location: Left Arm)   Pulse 87   Temp 98.8 F (37.1 C) (Oral)   Resp 18   Ht '5\' 7"'$  (1.702 m)   Wt 113.9 kg   SpO2 99%   BMI 39.31 kg/m  Gen:   Awake, no distress   Resp:  Normal effort  MSK:   Moves extremities without difficulty  Other:    Medical Decision Making  Medically screening exam initiated at 1:34 PM.  Appropriate orders placed.  Ashiyah Lexa Coronado was informed that the remainder of the evaluation will be completed by another provider, this initial triage assessment does not replace that evaluation, and the importance of remaining in the ED until their evaluation is complete.  Did not repeat work-up as patient was seen and left before evaluation just yesterday evening   Anselmo Pickler, [place] 12/16/21 1335

## 2021-12-16 NOTE — ED Triage Notes (Signed)
Pt arrived via POV, c/o vomiting, feeling hot and clear vaginal discharge x1 wk.

## 2021-12-16 NOTE — ED Notes (Signed)
Pt called 3x no answer  

## 2021-12-19 ENCOUNTER — Ambulatory Visit (HOSPITAL_COMMUNITY)
Admission: EM | Admit: 2021-12-19 | Discharge: 2021-12-19 | Disposition: A | Discharge status: Home / Self Care | Payer: Medicaid Other | Attending: Behavioral Health | Admitting: Behavioral Health

## 2021-12-19 DIAGNOSIS — Z760 Encounter for issue of repeat prescription: Secondary | ICD-10-CM | POA: Insufficient documentation

## 2021-12-19 DIAGNOSIS — F250 Schizoaffective disorder, bipolar type: Secondary | ICD-10-CM | POA: Insufficient documentation

## 2021-12-19 DIAGNOSIS — Z79899 Other long term (current) drug therapy: Secondary | ICD-10-CM

## 2021-12-19 NOTE — BH Assessment (Signed)
Patient presents voluntary to [facility] this date brought in by [facility] after she contacted them to transport her because she is "mad at her mother." Patient denies any S/I, H/I or AVH. Patient currently resides alone and reports she "can't stay here because she is moving this weekend" and just wants someone to talk to her about her medications. Patient cannot identify any specific symptoms although she reports she is "very nervous" when asked why she is feeling anxious patient stated that her mother is upset with her because she "might have a demon in her." Patient denies any SA issues. Patient is calm and cooperative.

## 2021-12-19 NOTE — ED Notes (Signed)
Discharge instructions provided and Pt stated understanding. Pt alert, orient and ambulatory prior to d/c from facility. Personal belongings returned from the blue locker. [facility] called for transportation services to Pt's home. Pt escorted to the front lobby to d/c from facility. Safety maintained.

## 2021-12-19 NOTE — Discharge Instructions (Addendum)
Per Miguel Dibble, RN.,  with [facility] team states that you are currently prescribed Ativan 1 mg p.o. twice daily x 7 days prescribed on 12/12/2021, BuSpar 15 mg p.o. 3 times a day prescribed on 12/12/2021, Cymbalta 60 mg p.o. daily prescribed on 12/12/2021 and continue Haldol 10 mg p.o. daily and 20 mg nightly and lithium 450 mg p.o. daily. Otila Kluver, RN., will speak with Dr. Sheppard Evens MD., to see if he can follow-up with you over the weekend for medication management.   Discharge recommendations:  Patient is to take medications as prescribed. Please see information for follow-up appointment with psychiatry and therapy. Please follow up with your primary care provider for all medical related needs.   Therapy: We recommend that patient participate in individual therapy to address mental health concerns.  Medications: The parent/guardian is to contact a medical professional and/or outpatient provider to address any new side effects that develop. Parent/guardian should update outpatient providers of any new medications and/or medication changes.   Atypical antipsychotics: If you are prescribed an atypical antipsychotic, it is recommended that your height, weight, BMI, blood pressure, fasting lipid panel, and fasting blood sugar be monitored by your outpatient providers.  Safety:  The patient should abstain from use of illicit substances/drugs and abuse of any medications. If symptoms worsen or do not continue to improve or if the patient becomes actively suicidal or homicidal then it is recommended that the patient return to the closest hospital emergency department, the [facility], or call 911 for further evaluation and treatment. National Suicide Prevention Lifeline 1-800-SUICIDE or 7571188569.  About 988 988 offers 24/7 access to trained crisis counselors who can help people experiencing mental health-related distress. People can call or text 988 or chat  988lifeline.org for themselves or if they are worried about a loved one who may need crisis support.

## 2021-12-19 NOTE — ED Provider Notes (Signed)
Behavioral Health Urgent Care Medical Screening Exam  Patient Name: [name] MRN: [id number] Date of Evaluation: 12/19/21 Chief Complaint:   Diagnosis:  Final diagnoses:  Schizoaffective disorder, bipolar type ([place])  Encounter for medication management    History of Present illness: [name] is a 31 y.o. female patient who has a past psychiatric history significant for schizoaffective disorder, bipolar type, and IDD, who presents to the [facility] behavioral health urgent care voluntary by law enforcement for an evaluation.  Patient seen and evaluated face-to-face by this provider, and chart reviewed. On evaluation, patient is alert and oriented x 4. Her thought process is logical and speech is clear and coherent at a moderate tone. Her mood is dysphoric and affect is congruent. She has fair eye contact. Patient appears well groomed and is casually dressed.  Patient states that she called the police because she went to her mother's house and her mother would not let her and because she recently had bedbugs. She states that her mother told her that she has demons inside of her. She states that she is mad at her mother because her mother would not let her in or answer the phone.  She states that she enjoys going to her mother has because her mother has a puppy that she likes. She states that she would like to know what medications she is supposed to take because she was recently started on Ativan an unknown dose, BuSpar 15 mg, and Cymbalta 60 mg. She states that she does not know if she is supposed to continue taking the Haldol or Lithium. She states that she had a [facility] from [facility] today that they went shopping for her new apartment. She states that they did not discuss medications. She states that she is excited about moving into her new apartment next weekend.   She denies suicidal ideations. She denies homicidal ideations. She denies self injures behaviors. She  denies auditory and visual hallucinations. There is no objective evidence that the patient is currently responding to internal or external stimuli. She reports fair sleep. She reports a fair appetite. She reports feeling sad but brightens up with talking about her new apartment.  I discussed with the patient contacting the [facility] to verify her current medication regimen.  Patient gives consent for this provider to contact [facility]. I spoke to Miguel Dibble a Equities trader at [employer]. Per Miguel Dibble, RN.,  patient is currently prescribed Ativan 1 mg p.o. twice daily x 7 days prescribed on 12/12/2021, BuSpar 15 mg p.o. 3 times a day prescribed on 12/12/2021, Cymbalta 60 mg p.o. daily prescribed on 12/12/2021 and continue Haldol 10 mg p.o. daily and 20 mg nightly and lithium 450 mg p.o. daily. Otila Kluver, RN.,  states that she will speak with Dr. Sheppard Evens MD., to see if he can follow-up with the patient over the weekend for medication management.  Plan: Patient denies SI/HI/AVH. Patient verbally contracts for safety to return home. Patient denies access to weapons in the home. I discussed with the patient that I was able to verify her medications with Miguel Dibble, RN., at [facility] and will write her current medications on her discharge summary for her to review and have access to. Patient was encouraged to follow up with the ACT team if she has any additional questions regarding medication management. Patient was advised that Otila Kluver will consult with Dr. Sheppard Evens, MD., to see if he can follow up with the patient  over the weekend for medication management. Patient advised to return to the [facility] if symptoms worsen or call 911 or present to the closest ED for an evaluation. Patient verbalizes understanding and agrees to the stated plan. Patient was transported back home via taxi.  Psychiatric Specialty Exam  Presentation  General Appearance:Appropriate for Environment  Eye  Contact:Fair  Speech:Clear and Coherent  Speech Volume:Normal  Handedness:Right   Mood and Affect  Mood:Dysphoric  Affect:Congruent   Thought Process  Thought Processes:Coherent  Descriptions of Associations:Intact  Orientation:Full (Time, Place and Person)  Thought Content:Logical  Diagnosis of Schizophrenia or Schizoaffective disorder in past: Yes  Duration of Psychotic Symptoms: Greater than six months  Hallucinations:None hearing demons talk to her Seeing demons talk to her  Ideas of Reference:None  Suicidal Thoughts:No With Intent; With Plan Without Means to Independence  Homicidal Thoughts:No Without Intent; Without Plan   Sensorium  Memory:Immediate Fair; Recent Fair; Remote Fair  Judgment:Fair  Insight:Fair   Executive Functions  Concentration:Fair  Attention Span:Fair  [place]   Psychomotor Activity  Psychomotor Activity:Normal   Assets  Assets:Communication Skills; Desire for Improvement; Housing; Leisure Time; Physical Health; Social Support   Sleep  Sleep:Fair  Number of hours: 8   No data recorded  Physical Exam: Physical Exam ROS Blood pressure (!) 145/91, pulse (!) 108, temperature 98.5 F (36.9 C), temperature source Oral, resp. rate 19, SpO2 100 %. There is no height or weight on file to calculate BMI.  Musculoskeletal: Strength & Muscle Tone: within normal limits Gait & Station: normal Patient leans: N/A   [place] MSE Discharge Disposition for Follow up and Recommendations: Based on my evaluation the patient does not appear to have an emergency medical condition and can be discharged with resources and follow up care in outpatient services for Medication Management   Per Miguel Dibble, RN.,  with [facility] states that you are currently prescribed Ativan 1 mg p.o. twice daily x 7 days prescribed on 12/12/2021, BuSpar 15 mg p.o. 3 times a day prescribed on 12/12/2021,  Cymbalta 60 mg p.o. daily prescribed on 12/12/2021 and continue Haldol 10 mg p.o. daily and 20 mg nightly and lithium 450 mg p.o. daily. Otila Kluver, RN., will speak with Dr. Sheppard Evens MD., to see if he can follow-up with you over the weekend for medication management.  Discharge recommendations:  Patient is to take medications as prescribed. Please see information for follow-up appointment with psychiatry and therapy. Please follow up with your primary care provider for all medical related needs.   Therapy: We recommend that patient participate in individual therapy to address mental health concerns.  Medications: The parent/guardian is to contact a medical professional and/or outpatient provider to address any new side effects that develop. Parent/guardian should update outpatient providers of any new medications and/or medication changes.   Atypical antipsychotics: If you are prescribed an atypical antipsychotic, it is recommended that your height, weight, BMI, blood pressure, fasting lipid panel, and fasting blood sugar be monitored by your outpatient providers.  Safety:  The patient should abstain from use of illicit substances/drugs and abuse of any medications. If symptoms worsen or do not continue to improve or if the patient becomes actively suicidal or homicidal then it is recommended that the patient return to the closest hospital emergency department, the [facility], or call 911 for further evaluation and treatment. National Suicide Prevention Lifeline 1-800-SUICIDE or 250 110 0060.  About 988 988 offers 24/7 access to trained  crisis counselors who can help people experiencing mental health-related distress. People can call or text 988 or chat 988lifeline.org for themselves or if they are worried about a loved one who may need crisis support.       Marissa Calamity, NP 12/19/2021, 6:29 PM

## 2021-12-22 ENCOUNTER — Emergency Department (HOSPITAL_COMMUNITY)
Admission: EM | Admit: 2021-12-22 | Discharge: 2021-12-22 | Discharge status: Against Medical Advice | Payer: Medicaid Other | Attending: Emergency Medicine | Admitting: Emergency Medicine

## 2021-12-22 ENCOUNTER — Encounter (HOSPITAL_COMMUNITY): Payer: Self-pay | Admitting: Emergency Medicine

## 2021-12-22 DIAGNOSIS — Z5321 Procedure and treatment not carried out due to patient leaving prior to being seen by health care provider: Secondary | ICD-10-CM | POA: Diagnosis not present

## 2021-12-22 DIAGNOSIS — Z046 Encounter for general psychiatric examination, requested by authority: Secondary | ICD-10-CM | POA: Diagnosis not present

## 2021-12-22 DIAGNOSIS — N898 Other specified noninflammatory disorders of vagina: Secondary | ICD-10-CM | POA: Diagnosis present

## 2021-12-22 NOTE — ED Triage Notes (Signed)
Pt arriving via [facility] from home with complaint of vaginal discharge that began approx 2 weeks ago. Pt seen for same on 12/16/21. Pt also requesting a psych eval to "help get her medications straight".

## 2021-12-24 ENCOUNTER — Other Ambulatory Visit: Payer: Self-pay

## 2021-12-24 ENCOUNTER — Encounter (HOSPITAL_COMMUNITY): Payer: Self-pay

## 2021-12-24 ENCOUNTER — Emergency Department (HOSPITAL_COMMUNITY)
Admission: EM | Admit: 2021-12-24 | Discharge: 2021-12-25 | Disposition: A | Discharge status: Home / Self Care | Payer: Medicaid Other | Attending: Emergency Medicine | Admitting: Emergency Medicine

## 2021-12-24 DIAGNOSIS — F329 Major depressive disorder, single episode, unspecified: Secondary | ICD-10-CM | POA: Insufficient documentation

## 2021-12-24 DIAGNOSIS — D72829 Elevated white blood cell count, unspecified: Secondary | ICD-10-CM | POA: Insufficient documentation

## 2021-12-24 DIAGNOSIS — R45851 Suicidal ideations: Secondary | ICD-10-CM | POA: Diagnosis not present

## 2021-12-24 DIAGNOSIS — R451 Restlessness and agitation: Secondary | ICD-10-CM | POA: Insufficient documentation

## 2021-12-24 DIAGNOSIS — Z20822 Contact with and (suspected) exposure to covid-19: Secondary | ICD-10-CM | POA: Insufficient documentation

## 2021-12-24 DIAGNOSIS — E119 Type 2 diabetes mellitus without complications: Secondary | ICD-10-CM | POA: Diagnosis not present

## 2021-12-24 DIAGNOSIS — R4789 Other speech disturbances: Secondary | ICD-10-CM | POA: Diagnosis not present

## 2021-12-24 DIAGNOSIS — Z7984 Long term (current) use of oral hypoglycemic drugs: Secondary | ICD-10-CM | POA: Diagnosis not present

## 2021-12-24 LAB — CBC WITH DIFFERENTIAL/PLATELET
Abs Immature Granulocytes: 0.03 10*3/uL (ref 0.00–0.07)
Basophils Absolute: 0.1 10*3/uL (ref 0.0–0.1)
Basophils Relative: 1 %
Eosinophils Absolute: 0.5 10*3/uL (ref 0.0–0.5)
Eosinophils Relative: 4 %
HCT: 38.5 % (ref 36.0–46.0)
Hemoglobin: 12.1 g/dL (ref 12.0–15.0)
Immature Granulocytes: 0 %
Lymphocytes Relative: 22 %
Lymphs Abs: 2.9 10*3/uL (ref 0.7–4.0)
MCH: 24.7 pg — ABNORMAL LOW (ref 26.0–34.0)
MCHC: 31.4 g/dL (ref 30.0–36.0)
MCV: 78.6 fL — ABNORMAL LOW (ref 80.0–100.0)
Monocytes Absolute: 0.8 10*3/uL (ref 0.1–1.0)
Monocytes Relative: 6 %
Neutro Abs: 8.5 10*3/uL — ABNORMAL HIGH (ref 1.7–7.7)
Neutrophils Relative %: 67 %
Platelets: 283 10*3/uL (ref 150–400)
RBC: 4.9 MIL/uL (ref 3.87–5.11)
RDW: 15.9 % — ABNORMAL HIGH (ref 11.5–15.5)
WBC: 12.8 10*3/uL — ABNORMAL HIGH (ref 4.0–10.5)
nRBC: 0 % (ref 0.0–0.2)

## 2021-12-24 LAB — COMPREHENSIVE METABOLIC PANEL
ALT: 19 U/L (ref 0–44)
AST: 25 U/L (ref 15–41)
Albumin: 4.4 g/dL (ref 3.5–5.0)
Alkaline Phosphatase: 46 U/L (ref 38–126)
Anion gap: 9 (ref 5–15)
BUN: 10 mg/dL (ref 6–20)
CO2: 23 mmol/L (ref 22–32)
Calcium: 9.9 mg/dL (ref 8.9–10.3)
Chloride: 109 mmol/L (ref 98–111)
Creatinine, Ser: 0.92 mg/dL (ref 0.44–1.00)
GFR, Estimated: >60 mL/min (ref >60)
Glucose, Bld: 163 mg/dL — ABNORMAL HIGH (ref 70–99)
Potassium: 3.6 mmol/L (ref 3.5–5.1)
Sodium: 141 mmol/L (ref 135–145)
Total Bilirubin: 0.6 mg/dL (ref 0.3–1.2)
Total Protein: 8 g/dL (ref 6.5–8.1)

## 2021-12-24 LAB — ETHANOL: Alcohol, Ethyl (B): <10 mg/dL (ref <10)

## 2021-12-24 LAB — I-STAT BETA HCG BLOOD, ED (MC, WL, AP ONLY): I-stat hCG, quantitative: <5 m[IU]/mL (ref <5)

## 2021-12-24 LAB — RESP PANEL BY RT-PCR (FLU A&B, COVID) ARPGX2
Influenza A by PCR: NEGATIVE
Influenza B by PCR: NEGATIVE
SARS Coronavirus 2 by RT PCR: NEGATIVE

## 2021-12-24 LAB — CBG MONITORING, ED: Glucose-Capillary: 160 mg/dL — ABNORMAL HIGH (ref 70–99)

## 2021-12-24 MED ORDER — HALOPERIDOL 5 MG PO TABS
5.0000 mg | ORAL_TABLET | Freq: Two times a day (BID) | ORAL | Status: DC
  Administered 2021-12-24 – 2021-12-25 (×2): 5 mg via ORAL
  Filled 2021-12-24 (×3): qty 1

## 2021-12-24 MED ORDER — LORAZEPAM 2 MG/ML IJ SOLN
2.0000 mg | Freq: Once | INTRAMUSCULAR | Status: AC
  Administered 2021-12-24: 2 mg via INTRAMUSCULAR
  Filled 2021-12-24: qty 1

## 2021-12-24 MED ORDER — STERILE WATER FOR INJECTION IJ SOLN
INTRAMUSCULAR | Status: AC
  Administered 2021-12-24: 0.5 mL
  Filled 2021-12-24: qty 10

## 2021-12-24 MED ORDER — BUSPIRONE HCL 5 MG PO TABS
7.5000 mg | ORAL_TABLET | Freq: Two times a day (BID) | ORAL | Status: DC
  Administered 2021-12-24 – 2021-12-25 (×2): 7.5 mg via ORAL
  Filled 2021-12-24 (×3): qty 1.5

## 2021-12-24 MED ORDER — LORAZEPAM 1 MG PO TABS
1.0000 mg | ORAL_TABLET | Freq: Once | ORAL | Status: AC
  Administered 2021-12-24: 1 mg via ORAL
  Filled 2021-12-24: qty 1

## 2021-12-24 MED ORDER — ZIPRASIDONE MESYLATE 20 MG IM SOLR
10.0000 mg | Freq: Once | INTRAMUSCULAR | Status: AC
  Administered 2021-12-24: 10 mg via INTRAMUSCULAR
  Filled 2021-12-24: qty 20

## 2021-12-24 MED ORDER — DULOXETINE HCL 30 MG PO CPEP
60.0000 mg | ORAL_CAPSULE | Freq: Every day | ORAL | Status: DC
  Administered 2021-12-24 – 2021-12-25 (×2): 60 mg via ORAL
  Filled 2021-12-24 (×2): qty 2

## 2021-12-24 NOTE — ED Provider Notes (Signed)
I provided a substantive portion of the care of this patient.  I personally performed the entirety of the medical decision making for this encounter.      31 year old female who is well-known to this department presents with suicidal ideations.  Patient plans to OD on her home medications.  Patient very difficult to understand at this time.  States that she needs Ativan.  Very anxious.  Patient will be given Ativan and Geodon.  Will reassess if psychiatric assessment needed   Lacretia Leigh, MD 12/24/21 1215

## 2021-12-24 NOTE — ED Notes (Signed)
Patient excessively loud. Saying "I see all these demons, I just want to die" Walking around hall and refusing to sit back on stretcher. Security and provider aware.

## 2021-12-24 NOTE — ED Notes (Signed)
Patient states she is willing to go to [facility]

## 2021-12-24 NOTE — ED Notes (Signed)
Pt crying loudly and pacing hallway. Talking very religiously. "I don't want to get baptized again, I don't want to go to that church any more because they treated me like shit." Pt difficult to redirect back to bed. Medications ordered.

## 2021-12-24 NOTE — ED Notes (Addendum)
LCSW sent the following information:    Pt was accepted to [facility] 12/25/21; Main [facility]   Pt meets inpatient criteria per Ricky Ala, NP  Attending Physician will be Dr. Jonelle Sports  Report can be called to: - [phone] or [phone] Pt can arrive after  Care Team notified: Ricky Ala, NP, and Eloisa Northern, RN  I, Jakera RN,  informed LCSW, provider and charge nurse that patient was refusing to go to [facility] and would likely need IVC prior to sending to [facility] in the morning.

## 2021-12-24 NOTE — ED Notes (Signed)
Patient is resting in bed. She is cooperative and pleasant. She endorses suicidal thoughts with no specific details.

## 2021-12-24 NOTE — ED Provider Notes (Signed)
[facility] DEPT Provider Note   CSN: [id number] Arrival date & time: 12/24/21  1123     History  Chief Complaint  Patient presents with   Suicidal    [name] is a 31 y.o. female.  HPI   Patient with medical history of bipolar 1, diabetes, cognitive deficits, anxiety, MDD, OCD, impulse control disorder presents today due to suicidal ideations.  Patient states she has been feeling this way for a few weeks, states she has plans to kill her self by overdosing on her home medicine.  She denies any HI, states "there is no point in living anymore".  Also states she "will kill herself if we try to send her home again".  Home Medications Prior to Admission medications   Medication Sig Start Date End Date Taking? Authorizing Provider  albuterol (VENTOLIN HFA) 108 (90 Base) MCG/ACT inhaler Inhale 1-2 puffs into the lungs every 6 (six) hours as needed for wheezing or shortness of breath. 01/11/21   Pearson Forster, NP  amLODipine (NORVASC) 5 MG tablet Take 5 mg by mouth daily.    [provider]  Clobetasol Propionate 0.05 % shampoo Apply 1 application. topically daily.    [provider]  glimepiride (AMARYL) 4 MG tablet Take 1 tablet (4 mg total) by mouth daily with breakfast. 09/30/21   Revonda Humphrey, NP  haloperidol (HALDOL) 5 MG tablet Take 10 mg by mouth at bedtime.    [provider]  haloperidol decanoate (HALDOL DECANOATE) 100 MG/ML injection Inject 150 mg into the muscle every 28 (twenty-eight) days.    [provider]  hydrOXYzine (VISTARIL) 50 MG capsule Take 50 mg by mouth at bedtime as needed for anxiety.    [provider]  insulin detemir (LEVEMIR) 100 UNIT/ML injection Inject 0.4 mLs (40 Units total) into the skin 2 (two) times daily. 10/14/21   Delfin Gant, NP  lithium carbonate (ESKALITH) 450 MG CR tablet Take 1 tablet (450 mg total) by mouth 2 (two) times daily. 10/14/21 01/31/22   Delfin Gant, NP  magic mouthwash (lidocaine, diphenhydrAMINE, alum & mag hydroxide) suspension Swish and spit 15 mLs 3 (three) times daily as needed for mouth pain. 12/12/21   Tedd Sias, PA  metoprolol tartrate (LOPRESSOR) 25 MG tablet Take 0.5 tablets (12.5 mg total) by mouth 2 (two) times daily. 10/14/21 01/31/22  Delfin Gant, NP  pantoprazole (PROTONIX) 40 MG tablet Take 1 tablet (40 mg total) by mouth daily. 10/15/21 01/31/22  Delfin Gant, NP  simvastatin (ZOCOR) 20 MG tablet Take 20 mg by mouth daily.    [provider]      Allergies    Wellbutrin [bupropion], Omnipaque [iohexol], Contrast media [iodinated contrast media], Depakote [divalproex sodium], Hydroxyzine, and Penicillin g    Review of Systems   Review of Systems  Physical Exam Updated Vital Signs BP (!) 156/98 (BP Location: Left Arm)   Pulse 98   Temp 98.1 F (36.7 C) (Oral)   Resp 20   LMP 12/09/2021 (Approximate)   SpO2 98%  Physical Exam Vitals and nursing note reviewed. Exam conducted with a chaperone present.  Constitutional:      Appearance: Normal appearance. She is obese.  HENT:     Head: Normocephalic and atraumatic.  Eyes:     General: No scleral icterus.       Right eye: No discharge.        Left eye: No discharge.  Extraocular Movements: Extraocular movements intact.     Pupils: Pupils are equal, round, and reactive to light.  Cardiovascular:     Rate and Rhythm: Normal rate and regular rhythm.     Pulses: Normal pulses.     Heart sounds: Normal heart sounds. No murmur heard.    No friction rub. No gallop.  Pulmonary:     Effort: Pulmonary effort is normal. No respiratory distress.     Breath sounds: Normal breath sounds.  Abdominal:     General: Abdomen is flat. Bowel sounds are normal. There is no distension.     Palpations: Abdomen is soft.     Tenderness: There is no abdominal tenderness.  Skin:    General: Skin is warm and dry.     Coloration: Skin  is not jaundiced.  Neurological:     Mental Status: She is alert. Mental status is at baseline.     Coordination: Coordination normal.  Psychiatric:        Attention and Perception: Attention normal.        Mood and Affect: Affect is labile and angry.        Speech: Speech is rapid and pressured.        Behavior: Behavior is uncooperative, agitated, aggressive and combative.        Thought Content: Thought content includes suicidal ideation. Thought content includes suicidal plan.        Judgment: Judgment is impulsive.     ED Results / Procedures / Treatments   Labs (all labs ordered are listed, but only abnormal results are displayed) Labs Reviewed  COMPREHENSIVE METABOLIC PANEL - Abnormal; Notable for the following components:      Result Value   Glucose, Bld 163 (*)    All other components within normal limits  CBC WITH DIFFERENTIAL/PLATELET - Abnormal; Notable for the following components:   WBC 12.8 (*)    MCV 78.6 (*)    MCH 24.7 (*)    RDW 15.9 (*)    Neutro Abs 8.5 (*)    All other components within normal limits  RESP PANEL BY RT-PCR (FLU A&B, COVID) ARPGX2  ETHANOL  RAPID URINE DRUG SCREEN, HOSP PERFORMED  VALPROIC ACID LEVEL  I-STAT BETA HCG BLOOD, ED ([facility], [facility], [facility] ONLY)    EKG None  Radiology No results found.  Procedures Procedures    Medications Ordered in ED Medications  busPIRone (BUSPAR) tablet 7.5 mg (has no administration in time range)  DULoxetine (CYMBALTA) DR capsule 60 mg (has no administration in time range)  haloperidol (HALDOL) tablet 5 mg (has no administration in time range)  ziprasidone (GEODON) injection 10 mg (10 mg Intramuscular Given 12/24/21 1225)  LORazepam (ATIVAN) injection 2 mg (2 mg Intramuscular Given 12/24/21 1225)  sterile water (preservative free) injection (0.5 mLs  Given 12/24/21 1226)    ED Course/ Medical Decision Making/ A&P Clinical Course as of 12/24/21 1332  Wed Dec 24, 2021  1312 ED EKG Sinus rhythm with no QT  prolongation [HS]  1312 CBC with Diff(!) No anemia, mild leukocytosis which appears to be chronic per chart review. [HS]  1313 Comprehensive metabolic panel(!) No gross electrolyte derangement, AKI or transaminitis [HS]  1313 I-Stat beta hCG blood, ED Not an ectopic pregnancy. [HS]  9390 Resp Panel by RT-PCR (Flu A&B, Covid) Anterior Nasal Swab Negative [HS]    Clinical Course User Index [HS] Sherrill Raring, PA-C  Medical Decision Making Amount and/or Complexity of Data Reviewed Labs: ordered. Decision-making details documented in ED Course. ECG/medicine tests:  Decision-making details documented in ED Course.  Risk Prescription drug management.   Patient presents due to suicidal ideations.  Patient is very well-known to this emergency department, I reviewed medical records and seen patient was actually cleared by the [facility] 12/19/21.  She is on Depakote, will check Depakote levels.  Considered alternative etiology but presentation seems most consistent with acute on chronic psychiatric condition.  There may be an element of behavioral issues as patient is repeatedly asking for food.  Patient is agitated and combative on the floor.  She is screaming, threatening staff.  I ordered Ativan and Geodon.    We will also check labs and EKG for QT interval.  I ordered, viewed and interpreted laboratory work-up.  Please see ED course.  Depakote level is pending the rest of laboratory work-up is resulted and is unremarkable.  Consider sending patient home as this seems to be a chronic behavioral issue.  However, patient states she.  She was very agitated on exam.  She was combative and staff and very aggressive requiring Geodon and Ativan for sedation.  I do think patient would benefit from TTS evaluation but I do not feel she needs to be IVC.  Awaiting pharmacy reconciliation of medication before reordering home meds.  Diet order in, at this point patient is medically  cleared and appropriate for TTS evaluation        Final Clinical Impression(s) / ED Diagnoses Final diagnoses:  None    Rx / DC Orders ED Discharge Orders     None         Sherrill Raring, Hershal Coria 12/24/21 1332    Lacretia Leigh, MD 12/25/21 [phone]

## 2021-12-24 NOTE — ED Triage Notes (Signed)
Pt arrived via POV, c/o SI with plan to OD on home meds. Denies HI

## 2021-12-24 NOTE — Progress Notes (Signed)
Pt was accepted to [facility] 12/25/21; Main Campus   Pt meets inpatient criteria per Ricky Ala, NP  Attending Physician will be Dr. Jonelle Sports  Report can be called to: - [phone] or [phone] Pt can arrive after  Care Team notified: Ricky Ala, NP, and Eloisa Northern, RN  Nadara Mode, [place] 12/24/2021 @ 4:00 PM

## 2021-12-24 NOTE — Consult Note (Signed)
[place] ED ASSESSMENT   Reason for Consult:  Suicidal Ideaitons Referring Physician:   EPD Patient Identification: [name] MRN:  [id number] ED Chief Complaint: Suicidal ideation  Diagnosis:  Principal Problem:   Suicidal ideation   ED Assessment Time Calculation: Start Time: 2202 Stop Time: 1315 Total Time in Minutes (Assessment Completion): 29   Subjective:   [name] is a 31 y.o. female seen and evaluated face-to-face.  Patient is well-known to this service.  She has a charted history with schizoaffective disorder, bipolar and intellectual disability.  [facility].  Patient is requesting to be placed in a mental health facility due to her suicidal ideations. she presents pressured, hyperverbal and delusional.  Reporting " demons are everywhere."  Alani reports she does not feel that her medications is helping her mood.  States she has been taking medications as indicated.  On admission agitation orders were placed/given.  Will restart home medications where appropriate.  We will follow-up with behavioral health for bed availability.   HPI:  Per admission assessment note:  "Patient with medical history of bipolar 1, diabetes, cognitive deficits, anxiety, MDD, OCD, impulse control disorder presents today due to suicidal ideations.  Patient states she has been feeling this way for a few weeks, states she has plans to kill her self by overdosing on her home medicine.  She denies any HI, states "there is no point in living anymore".  Also states she "will kill herself if we try to send her home again"."    Past Psychiatric History:   Risk to Self or Others: Is the patient at risk to self? Yes Has the patient been a risk to self in the past 6 months? Yes Has the patient been a risk to self within the distant past? No Is the patient a risk to others? No Has the patient been a risk to others in the past 6 months? No Has the patient been  a risk to others within the distant past? No  [place] Scale:  [place] ED from 12/24/2021 in [facility] ED from 12/22/2021 in [facility] [facility] ED from 12/16/2021 in [place] [facility]  C-SSRS RISK CATEGORY High Risk Error: Q3, 4, or 5 should not be populated when Q2 is No No Risk       AIMS:  , , ,  ,   ASAM:    Substance Abuse:     Past Medical History:  Past Medical History:  Diagnosis Date   Anxiety    Bipolar 1 disorder ([place])    Cognitive deficits    Depression    Diabetes mellitus without complication ([place])    Hypertension    Mental disorder    Mental health disorder    Obesity     Past Surgical History:  Procedure Laterality Date   CESAREAN SECTION     CESAREAN SECTION N/A 04/25/2013   Procedure: REPEAT CESAREAN SECTION;  Surgeon: [name], MD;  Location: [facility];  Service: Obstetrics;  Laterality: N/A;   MASS EXCISION N/A 06/03/2012   Procedure: EXCISION MASS;  Surgeon: [name], MD;  Location: [facility];  Service: ENT;  Laterality: N/A;  Excision uvula mass   TONSILLECTOMY N/A 06/03/2012   Procedure: TONSILLECTOMY;  Surgeon: [name], MD;  Location: [facility];  Service: ENT;  Laterality: N/A;   TONSILLECTOMY     Family History:  Family History  Problem  Relation Age of Onset   Hypertension Mother    Diabetes Father    Family Psychiatric  History:  Social History:  Social History   Substance and Sexual Activity  Alcohol Use Not Currently     Social History   Substance and Sexual Activity  Drug Use Not Currently   Types: "Crack" cocaine, Other-see comments   Comment: Patient reports hx of smoking Crack    Social History   Socioeconomic History   Marital status: Single    Spouse name: Not on file   Number of children: Not on file   Years of education: Not on file   Highest education level: Not on file   Occupational History   Not on file  Tobacco Use   Smoking status: Every Day    Types: Cigars   Smokeless tobacco: Never   Tobacco comments:    Pt declined  Vaping Use   Vaping Use: Never used  Substance and Sexual Activity   Alcohol use: Not Currently   Drug use: Not Currently    Types: "Crack" cocaine, Other-see comments    Comment: Patient reports hx of smoking Crack   Sexual activity: Not Currently    Birth control/protection: None    Comment: occasionally  Other Topics Concern   Not on file  Social History Narrative   ** Merged History Encounter **       Social Determinants of Health   Financial Resource Strain: Not on file  Food Insecurity: Not on file  Transportation Needs: Not on file  Physical Activity: Not on file  Stress: Not on file  Social Connections: Not on file   Additional Social History:    Allergies:   Allergies  Allergen Reactions   Wellbutrin [Bupropion] Shortness Of Breath   Omnipaque [Iohexol] Swelling and Other (See Comments)    Eye swelling   Contrast Media [Iodinated Contrast Media] Other (See Comments)    Eyes swell up   Depakote [Divalproex Sodium] Other (See Comments)    Mouth sores. dizziness   Hydroxyzine Other (See Comments)    Causes hyperactivity, makes pt want to "fight"   Penicillin G Hives    Labs:  Results for orders placed or performed during the hospital encounter of 12/24/21 (from the past 48 hour(s))  Comprehensive metabolic panel     Status: Abnormal   Collection Time: 12/24/21 12:02 PM  Result Value Ref Range   Sodium 141 135 - 145 mmol/L   Potassium 3.6 3.5 - 5.1 mmol/L   Chloride 109 98 - 111 mmol/L   CO2 23 22 - 32 mmol/L   Glucose, Bld 163 (H) 70 - 99 mg/dL    Comment: Glucose reference range applies only to samples taken after fasting for at least 8 hours.   BUN 10 6 - 20 mg/dL   Creatinine, Ser 0.92 0.44 - 1.00 mg/dL   Calcium 9.9 8.9 - 10.3 mg/dL   Total Protein 8.0 6.5 - 8.1 g/dL   Albumin 4.4 3.5 -  5.0 g/dL   AST 25 15 - 41 U/L   ALT 19 0 - 44 U/L   Alkaline Phosphatase 46 38 - 126 U/L   Total Bilirubin 0.6 0.3 - 1.2 mg/dL   GFR, Estimated >60 >60 mL/min    Comment: (NOTE) Calculated using the CKD-EPI Creatinine Equation (2021)    Anion gap 9 5 - 15    Comment: Performed at [facility], [place] [address], [place], [place] [address]  Ethanol  Status: None   Collection Time: 12/24/21 12:02 PM  Result Value Ref Range   Alcohol, Ethyl (B) <10 <10 mg/dL    Comment: (NOTE) Lowest detectable limit for serum alcohol is 10 mg/dL.  For medical purposes only. Performed at [facility], [place] [address] [place], [place] [address]   CBC with Diff     Status: Abnormal   Collection Time: 12/24/21 12:02 PM  Result Value Ref Range   WBC 12.8 (H) 4.0 - 10.5 K/uL   RBC 4.90 3.87 - 5.11 MIL/uL   Hemoglobin 12.1 12.0 - 15.0 g/dL   HCT 38.5 36.0 - 46.0 %   MCV 78.6 (L) 80.0 - 100.0 fL   MCH 24.7 (L) 26.0 - 34.0 pg   MCHC 31.4 30.0 - 36.0 g/dL   RDW 15.9 (H) 11.5 - 15.5 %   Platelets 283 150 - 400 K/uL   nRBC 0.0 0.0 - 0.2 %   Neutrophils Relative % 67 %   Neutro Abs 8.5 (H) 1.7 - 7.7 K/uL   Lymphocytes Relative 22 %   Lymphs Abs 2.9 0.7 - 4.0 K/uL   Monocytes Relative 6 %   Monocytes Absolute 0.8 0.1 - 1.0 K/uL   Eosinophils Relative 4 %   Eosinophils Absolute 0.5 0.0 - 0.5 K/uL   Basophils Relative 1 %   Basophils Absolute 0.1 0.0 - 0.1 K/uL   Immature Granulocytes 0 %   Abs Immature Granulocytes 0.03 0.00 - 0.07 K/uL    Comment: Performed at [facility], [place] [address], [place], [place] [address]  Resp Panel by RT-PCR (Flu A&B, Covid) Anterior Nasal Swab     Status: None   Collection Time: 12/24/21 12:26 PM   Specimen: Anterior Nasal Swab  Result Value Ref Range   SARS Coronavirus 2 by RT PCR NEGATIVE NEGATIVE    Comment: (NOTE) SARS-CoV-2 target nucleic acids are NOT DETECTED.  The SARS-CoV-2 RNA is generally  detectable in upper respiratory specimens during the acute phase of infection. The lowest concentration of SARS-CoV-2 viral copies this assay can detect is 138 copies/mL. A negative result does not preclude SARS-Cov-2 infection and should not be used as the sole basis for treatment or other patient management decisions. A negative result may occur with  improper specimen collection/handling, submission of specimen other than nasopharyngeal swab, presence of viral mutation(s) within the areas targeted by this assay, and inadequate number of viral copies(<138 copies/mL). A negative result must be combined with clinical observations, patient history, and epidemiological information. The expected result is Negative.  Fact Sheet for Patients:  EntrepreneurPulse.com.au  Fact Sheet for Healthcare Providers:  IncredibleEmployment.be  This test is no t yet approved or cleared by the [place] FDA and  has been authorized for detection and/or diagnosis of SARS-CoV-2 by FDA under an Emergency Use Authorization (EUA). This EUA will remain  in effect (meaning this test can be used) for the duration of the COVID-19 declaration under Section 564(b)(1) of the Act, 21 U.S.C.section 360bbb-3(b)(1), unless the authorization is terminated  or revoked sooner.       Influenza A by PCR NEGATIVE NEGATIVE   Influenza B by PCR NEGATIVE NEGATIVE    Comment: (NOTE) The Xpert Xpress SARS-CoV-2/FLU/RSV plus assay is intended as an aid in the diagnosis of influenza from Nasopharyngeal swab specimens and should not be used as a sole basis for treatment. Nasal washings and aspirates are unacceptable for Xpert Xpress SARS-CoV-2/FLU/RSV testing.  Fact Sheet for Patients: EntrepreneurPulse.com.au  Fact  Sheet for Healthcare Providers: IncredibleEmployment.be  This test is not yet approved or cleared by the [place] and has  been authorized for detection and/or diagnosis of SARS-CoV-2 by FDA under an Emergency Use Authorization (EUA). This EUA will remain in effect (meaning this test can be used) for the duration of the COVID-19 declaration under Section 564(b)(1) of the Act, 21 U.S.C. section 360bbb-3(b)(1), unless the authorization is terminated or revoked.  Performed at [facility], [place] [address] [place], [place] [address]   I-Stat beta hCG blood, ED     Status: None   Collection Time: 12/24/21 12:37 PM  Result Value Ref Range   I-stat hCG, quantitative <5.0 <5 mIU/mL   Comment 3            Comment:   GEST. AGE      CONC.  (mIU/mL)   <=1 WEEK        5 - 50     2 WEEKS       50 - 500     3 WEEKS       100 - 10,000     4 WEEKS     1,000 - 30,000        FEMALE AND NON-PREGNANT FEMALE:     LESS THAN 5 mIU/mL     Current Facility-Administered Medications  Medication Dose Route Frequency Provider Last Rate Last Admin   busPIRone (BUSPAR) tablet 7.5 mg  7.5 mg Oral BID Derrill Center, NP       DULoxetine (CYMBALTA) DR capsule 60 mg  60 mg Oral Daily Nuno Brubacher, Marcy Panning, NP       haloperidol (HALDOL) tablet 5 mg  5 mg Oral BID Derrill Center, NP       Current Outpatient Medications  Medication Sig Dispense Refill   albuterol (VENTOLIN HFA) 108 (90 Base) MCG/ACT inhaler Inhale 1-2 puffs into the lungs every 6 (six) hours as needed for wheezing or shortness of breath. 18 g 0   amLODipine (NORVASC) 5 MG tablet Take 5 mg by mouth daily.     Clobetasol Propionate 0.05 % shampoo Apply 1 application. topically daily.     glimepiride (AMARYL) 4 MG tablet Take 1 tablet (4 mg total) by mouth daily with breakfast.     haloperidol (HALDOL) 5 MG tablet Take 10 mg by mouth at bedtime.     haloperidol decanoate (HALDOL DECANOATE) 100 MG/ML injection Inject 150 mg into the muscle every 28 (twenty-eight) days.     hydrOXYzine (VISTARIL) 50 MG capsule Take 50 mg by mouth at bedtime as needed for  anxiety.     insulin detemir (LEVEMIR) 100 UNIT/ML injection Inject 0.4 mLs (40 Units total) into the skin 2 (two) times daily. 10 mL 11   lithium carbonate (ESKALITH) 450 MG CR tablet Take 1 tablet (450 mg total) by mouth 2 (two) times daily. 60 tablet 0   magic mouthwash (lidocaine, diphenhydrAMINE, alum & mag hydroxide) suspension Swish and spit 15 mLs 3 (three) times daily as needed for mouth pain. 360 mL 0   metoprolol tartrate (LOPRESSOR) 25 MG tablet Take 0.5 tablets (12.5 mg total) by mouth 2 (two) times daily. 30 tablet 0   pantoprazole (PROTONIX) 40 MG tablet Take 1 tablet (40 mg total) by mouth daily. 30 tablet 0   simvastatin (ZOCOR) 20 MG tablet Take 20 mg by mouth daily.      Musculoskeletal: Strength & Muscle Tone: within normal limits Gait & Station: normal Patient  leans: N/A   Psychiatric Specialty Exam: Presentation  General Appearance: Disheveled  Eye Contact:Good  Speech:Clear and Coherent  Speech Volume:Normal  Handedness:Left   Mood and Affect  Mood:Irritable; Labile  Affect:Congruent   Thought Process  Thought Processes:Coherent  Descriptions of Associations:Intact  Orientation:Full (Time, Place and Person)  Thought Content:Paranoid Ideation; Rumination  History of Schizophrenia/Schizoaffective disorder:Yes  Duration of Psychotic Symptoms:N/A  Hallucinations:Hallucinations: Auditory  Ideas of Reference:Paranoia  Suicidal Thoughts:Suicidal Thoughts: Yes, Active SI Active Intent and/or Plan: With Intent  Homicidal Thoughts:Homicidal Thoughts: No   Sensorium  Memory:Immediate Fair; Remote Good; Remote Fair  Judgment:Fair  Insight:Good   Executive Functions  Concentration:Good  Attention Span:Fair  [place]   Psychomotor Activity  Psychomotor Activity:Psychomotor Activity: Normal   Assets  Assets:Desire for Improvement; Armed forces logistics/support/administrative officer; Social Support    Sleep   Sleep:Sleep: Fair   Physical Exam: Physical Exam Vitals and nursing note reviewed.  Cardiovascular:     Rate and Rhythm: Normal rate and regular rhythm.  Neurological:     General: No focal deficit present.     Mental Status: She is alert and oriented to person, place, and time.  Psychiatric:        Mood and Affect: Mood normal.        Thought Content: Thought content normal.    Review of Systems  Psychiatric/Behavioral:  Positive for depression and suicidal ideas. Negative for substance abuse. The patient is nervous/anxious.   All other systems reviewed and are negative.  Blood pressure (!) 156/98, pulse 98, temperature 98.1 F (36.7 C), temperature source Oral, resp. rate 20, last menstrual period 12/09/2021, SpO2 98 %. There is no height or weight on file to calculate BMI.  Medical Decision Making: - restarted home medications -CSW to follow-up with disposition for inpatient admission recommendation.  Disposition: Recommend psychiatric Inpatient admission when medically cleared.  Derrill Center, NP 12/24/2021 2:09 PM

## 2021-12-25 NOTE — ED Notes (Signed)
Attempted to call [facility]. No answer. [phone]

## 2021-12-25 NOTE — ED Notes (Signed)
Patient refusing to go to [facility] via [employer] reporting that she will not

## 2021-12-25 NOTE — ED Notes (Signed)
[facility] called to transport to [facility]

## 2021-12-25 NOTE — Progress Notes (Signed)
[name] is a 31 y.o. female seen and evaluated face-to-face.  Patient is well-known to this service.  She has a charted history with schizoaffective disorder, bipolar and intellectual disability.  [facility] is [facility].  Patient is requesting to be placed in a mental health facility due to her suicidal ideations. she presents pressured, hyperverbal and delusional.  Reporting " demons are everywhere."    Patient accepted to inpatient psychiatric treatment at [facility]. Patient declined to go to [facility] due to transportation issues. Continues to report that she will kill herself if she leaves the hospital. Patient petitioned for IVC by this provider.

## 2022-01-02 ENCOUNTER — Emergency Department (HOSPITAL_COMMUNITY)
Admission: EM | Admit: 2022-01-02 | Discharge: 2022-01-03 | Disposition: A | Discharge status: Home / Self Care | Payer: Medicaid Other | Attending: Emergency Medicine | Admitting: Emergency Medicine

## 2022-01-02 ENCOUNTER — Encounter (HOSPITAL_COMMUNITY): Payer: Self-pay

## 2022-01-02 DIAGNOSIS — R451 Restlessness and agitation: Secondary | ICD-10-CM | POA: Diagnosis present

## 2022-01-02 DIAGNOSIS — R45851 Suicidal ideations: Secondary | ICD-10-CM | POA: Diagnosis not present

## 2022-01-02 DIAGNOSIS — F29 Unspecified psychosis not due to a substance or known physiological condition: Secondary | ICD-10-CM | POA: Insufficient documentation

## 2022-01-02 DIAGNOSIS — F250 Schizoaffective disorder, bipolar type: Secondary | ICD-10-CM | POA: Insufficient documentation

## 2022-01-02 DIAGNOSIS — Z20822 Contact with and (suspected) exposure to covid-19: Secondary | ICD-10-CM | POA: Insufficient documentation

## 2022-01-02 DIAGNOSIS — Z794 Long term (current) use of insulin: Secondary | ICD-10-CM | POA: Diagnosis not present

## 2022-01-02 DIAGNOSIS — Z765 Malingerer [conscious simulation]: Secondary | ICD-10-CM

## 2022-01-02 LAB — CBC WITH DIFFERENTIAL/PLATELET
Abs Immature Granulocytes: 0.03 10*3/uL (ref 0.00–0.07)
Basophils Absolute: 0.1 10*3/uL (ref 0.0–0.1)
Basophils Relative: 1 %
Eosinophils Absolute: 0.6 10*3/uL — ABNORMAL HIGH (ref 0.0–0.5)
Eosinophils Relative: 7 %
HCT: 33.8 % — ABNORMAL LOW (ref 36.0–46.0)
Hemoglobin: 10.4 g/dL — ABNORMAL LOW (ref 12.0–15.0)
Immature Granulocytes: 0 %
Lymphocytes Relative: 37 %
Lymphs Abs: 3.2 10*3/uL (ref 0.7–4.0)
MCH: 24.7 pg — ABNORMAL LOW (ref 26.0–34.0)
MCHC: 30.8 g/dL (ref 30.0–36.0)
MCV: 80.3 fL (ref 80.0–100.0)
Monocytes Absolute: 0.4 10*3/uL (ref 0.1–1.0)
Monocytes Relative: 5 %
Neutro Abs: 4.2 10*3/uL (ref 1.7–7.7)
Neutrophils Relative %: 50 %
Platelets: 205 10*3/uL (ref 150–400)
RBC: 4.21 MIL/uL (ref 3.87–5.11)
RDW: 15.9 % — ABNORMAL HIGH (ref 11.5–15.5)
WBC: 8.5 10*3/uL (ref 4.0–10.5)
nRBC: 0 % (ref 0.0–0.2)

## 2022-01-02 LAB — COMPREHENSIVE METABOLIC PANEL
ALT: 21 U/L (ref 0–44)
AST: 22 U/L (ref 15–41)
Albumin: 3.8 g/dL (ref 3.5–5.0)
Alkaline Phosphatase: 49 U/L (ref 38–126)
Anion gap: 4 — ABNORMAL LOW (ref 5–15)
BUN: 11 mg/dL (ref 6–20)
CO2: 25 mmol/L (ref 22–32)
Calcium: 8.8 mg/dL — ABNORMAL LOW (ref 8.9–10.3)
Chloride: 110 mmol/L (ref 98–111)
Creatinine, Ser: 0.78 mg/dL (ref 0.44–1.00)
GFR, Estimated: >60 mL/min (ref >60)
Glucose, Bld: 178 mg/dL — ABNORMAL HIGH (ref 70–99)
Potassium: 3.5 mmol/L (ref 3.5–5.1)
Sodium: 139 mmol/L (ref 135–145)
Total Bilirubin: 0.4 mg/dL (ref 0.3–1.2)
Total Protein: 6.8 g/dL (ref 6.5–8.1)

## 2022-01-02 LAB — RAPID URINE DRUG SCREEN, HOSP PERFORMED
Amphetamines: NOT DETECTED
Barbiturates: NOT DETECTED
Benzodiazepines: NOT DETECTED
Cocaine: NOT DETECTED
Opiates: NOT DETECTED
Tetrahydrocannabinol: NOT DETECTED

## 2022-01-02 LAB — RESP PANEL BY RT-PCR (FLU A&B, COVID) ARPGX2
Influenza A by PCR: NEGATIVE
Influenza B by PCR: NEGATIVE
SARS Coronavirus 2 by RT PCR: NEGATIVE

## 2022-01-02 LAB — HCG, QUANTITATIVE, PREGNANCY: hCG, Beta Chain, Quant, S: <1 m[IU]/mL (ref <5)

## 2022-01-02 LAB — LITHIUM LEVEL: Lithium Lvl: 0.29 mmol/L — ABNORMAL LOW (ref 0.60–1.20)

## 2022-01-02 LAB — ETHANOL: Alcohol, Ethyl (B): <10 mg/dL (ref <10)

## 2022-01-02 MED ORDER — GLIMEPIRIDE 4 MG PO TABS
4.0000 mg | ORAL_TABLET | Freq: Every day | ORAL | Status: DC
  Administered 2022-01-03: 4 mg via ORAL
  Filled 2022-01-02: qty 1

## 2022-01-02 MED ORDER — LORAZEPAM 2 MG/ML IJ SOLN
INTRAMUSCULAR | Status: AC
  Administered 2022-01-02: 2 mg via INTRAMUSCULAR
  Filled 2022-01-02: qty 1

## 2022-01-02 MED ORDER — AMLODIPINE BESYLATE 5 MG PO TABS
5.0000 mg | ORAL_TABLET | Freq: Every day | ORAL | Status: DC
  Administered 2022-01-02 – 2022-01-03 (×2): 5 mg via ORAL
  Filled 2022-01-02 (×2): qty 1

## 2022-01-02 MED ORDER — DULOXETINE HCL 30 MG PO CPEP
60.0000 mg | ORAL_CAPSULE | Freq: Every day | ORAL | Status: DC
  Filled 2022-01-02 (×2): qty 2

## 2022-01-02 MED ORDER — HALOPERIDOL 5 MG PO TABS
10.0000 mg | ORAL_TABLET | Freq: Every day | ORAL | Status: DC
Start: 2022-01-03 — End: 2022-01-03
  Administered 2022-01-03: 10 mg via ORAL
  Filled 2022-01-02: qty 2

## 2022-01-02 MED ORDER — HALOPERIDOL LACTATE 5 MG/ML IJ SOLN
5.0000 mg | Freq: Once | INTRAMUSCULAR | Status: AC
  Administered 2022-01-02: 5 mg via INTRAMUSCULAR
  Filled 2022-01-02: qty 1

## 2022-01-02 MED ORDER — HALOPERIDOL 5 MG PO TABS: 10.0000 mg | ORAL_TABLET | ORAL | Status: DC

## 2022-01-02 MED ORDER — BENZTROPINE MESYLATE 0.5 MG PO TABS
1.0000 mg | ORAL_TABLET | Freq: Two times a day (BID) | ORAL | Status: DC
  Administered 2022-01-02 – 2022-01-03 (×3): 1 mg via ORAL
  Filled 2022-01-02 (×3): qty 2

## 2022-01-02 MED ORDER — LORAZEPAM 2 MG/ML IJ SOLN: 2.0000 mg | Freq: Once | INTRAMUSCULAR | Status: AC

## 2022-01-02 MED ORDER — INSULIN DETEMIR 100 UNIT/ML ~~LOC~~ SOLN
40.0000 [IU] | Freq: Two times a day (BID) | SUBCUTANEOUS | Status: DC
  Administered 2022-01-03: 40 [IU] via SUBCUTANEOUS
  Filled 2022-01-02 (×2): qty 0.4

## 2022-01-02 MED ORDER — BUSPIRONE HCL 10 MG PO TABS
15.0000 mg | ORAL_TABLET | Freq: Three times a day (TID) | ORAL | Status: DC
  Administered 2022-01-02: 15 mg via ORAL
  Filled 2022-01-02: qty 2

## 2022-01-02 MED ORDER — HALOPERIDOL 5 MG PO TABS
20.0000 mg | ORAL_TABLET | Freq: Every day | ORAL | Status: DC
  Administered 2022-01-02: 20 mg via ORAL
  Filled 2022-01-02: qty 4
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
## 2022-01-02 NOTE — Progress Notes (Signed)
Patient has been denied by [facility] due to no appropriate beds available. Patient meets [place] inpatient criteria per Lindon Romp, NP. Patient has been faxed out to the following facilities:    [facility]  [address] [place] [address] [phone]  [place], [place] [address] [place]  [facility]  [address] [place] [address] [phone] [place]  [address], [place] [address] [phone]  [place]  [address] [place] [address] [phone] [place]  [address] [place] [address] [phone]  [facility]  [address], [place] [address] [phone] [facility]  [place], [place] [address] [phone]  [facility]  [address] [place], [place] [address] [phone]  [facility]  [phone] [address] [place] [address] [phone] [facility]  [address] [place] [address] [phone] [facility]  [address], [place] [address] [phone]  [facility]  [address] [place] [address] [phone]  [facility]  [address], [place] [address] [phone] [place], MSW, LCSW-A  11:51 PM 01/02/2022

## 2022-01-02 NOTE — ED Triage Notes (Signed)
Pt arrived via POV, c/o SI and HI

## 2022-01-03 LAB — CBG MONITORING, ED
Glucose-Capillary: 176 mg/dL — ABNORMAL HIGH (ref 70–99)
Glucose-Capillary: 164 mg/dL — ABNORMAL HIGH (ref 70–99)

## 2022-01-03 MED ORDER — LORAZEPAM 1 MG PO TABS
1.0000 mg | ORAL_TABLET | Freq: Once | ORAL | Status: AC
  Administered 2022-01-03: 1 mg via ORAL
  Filled 2022-01-03: qty 1

## 2022-01-03 NOTE — ED Notes (Signed)
Pt provided discharge instructions and prescription information. Pt was given the opportunity to ask questions and questions were answered.   Pt is calm and appropriate at d/c. Expresses no SI/HI.

## 2022-01-03 NOTE — Progress Notes (Signed)
CSW followed-up with [place] regarding referral sent for placement. It was reported that the patient is under further review.   Glennie [place], MSW, [place] Phone: [phone] Disposition/TOC

## 2022-01-03 NOTE — ED Provider Notes (Signed)
Cleared by psych.  Discharge.   Lennice Sites, DO 01/03/22 1549

## 2022-01-03 NOTE — Discharge Instructions (Signed)
  Discharge recommendations:  Patient is to take medications as prescribed. Please see information for follow-up appointment with psychiatry and therapy. Please follow up with your primary care provider for all medical related needs.   Therapy: We recommend that patient participate in individual therapy to address mental health concerns.  Medications: The patient is to contact a medical professional and/or outpatient provider to address any new side effects that develop. Patient should update outpatient providers of any new medications and/or medication changes.   Atypical antipsychotics: If you are prescribed an atypical antipsychotic, it is recommended that your height, weight, BMI, blood pressure, fasting lipid panel, and fasting blood sugar be monitored by your outpatient providers.  Safety:  The patient should abstain from use of illicit substances/drugs and abuse of any medications. If symptoms worsen or do not continue to improve or if the patient becomes actively suicidal or homicidal then it is recommended that the patient return to the closest hospital emergency department, the [facility], or call 911 for further evaluation and treatment. National Suicide Prevention Lifeline 1-800-SUICIDE or (218)104-8995.  About 988 988 offers 24/7 access to trained crisis counselors who can help people experiencing mental health-related distress. People can call or text 988 or chat 988lifeline.org for themselves or if they are worried about a loved one who may need crisis support.  Crisis Mobile: [facility]:                     [phone] (for crisis response 24 hours a day) [place]:                                            [phone]

## 2022-01-03 NOTE — Discharge Summary (Addendum)
[facility] Psych ED Discharge  01/03/2022 3:47 PM [name]  MRN:  [id number]  Principal Problem: <principal problem not specified> Discharge Diagnoses: Active Problems:   Schizoaffective disorder, bipolar type ([place])  Clinical Impression:  Final diagnoses:  Malingering  Schizoaffective disorder, bipolar type ([place])   Subjective: [name] is a 31 year old female with a documented history of schizoaffective disorder, bipolar type and malingering who presented to [facility] on 01/02/22 due to suicidal ideations. Patient is very well known to this provider behavioral health services, and area emergency departments. Patient reports that she is no longer suicidal and requests discharge home. Patient states that she would like to leave soon, so she can [place]." Patient states that she will return to the ED or [place] if suicidal thoughts return or her mood worsens. Patient was initially agitated when she presented to the ED and reported SI with plan to overdose on her medications. She ruminated on returning to [facility] because they were going to assist her with group home placement. Patient has not displayed any aggressive behavior or agitation since her initial arrival to the ED. Patient often presents to the ED or [place] with SI and then denies SI the following day. Based on my experience with this patient, she appears to be at her baseline. Patient states that she will contact her ACT team to deliver her medications.  Home medications were continued during this admission in the ED: -haldol 10 mg oral in the morning and 20 mg at HS for schizoaffective disorder -cogentin 1 mg BID for EPS prevention -duloxetine 60 mg daily for depression   On evaluation patient is alert and oriented x 4, pleasant, and cooperative. Speech is clear and coherent. Mood is euthymic and affect is congruent with mood. Patient laughs appropriately when interacting with this provider. Thought process is  coherent and thought content is logical. Denies auditory and visual hallucinations. No indication that patient is responding to internal stimuli. No evidence of delusional thought content. Denies suicidal ideations. Denies homicidal ideations.   ED Assessment Time Calculation: Start Time: 1300 Stop Time: 1320 Total Time in Minutes (Assessment Completion): 20   Past Psychiatric History: Schizoaffective disorder, major depressive disorder, generalized anxiety disorder. Multiple inpatient admissions.   Past Medical History:  Past Medical History:  Diagnosis Date   Anxiety    Bipolar 1 disorder ([place])    Cognitive deficits    Depression    Diabetes mellitus without complication ([place])    Hypertension    Mental disorder    Mental health disorder    Obesity     Past Surgical History:  Procedure Laterality Date   CESAREAN SECTION     CESAREAN SECTION N/A 04/25/2013   Procedure: REPEAT CESAREAN SECTION;  Surgeon: [name], MD;  Location: [facility];  Service: Obstetrics;  Laterality: N/A;   MASS EXCISION N/A 06/03/2012   Procedure: EXCISION MASS;  Surgeon: [name], MD;  Location: [facility];  Service: ENT;  Laterality: N/A;  Excision uvula mass   TONSILLECTOMY N/A 06/03/2012   Procedure: TONSILLECTOMY;  Surgeon: [name], MD;  Location: [facility];  Service: ENT;  Laterality: N/A;   TONSILLECTOMY     Family History:  Family History  Problem Relation Age of Onset   Hypertension Mother    Diabetes Father     Social History:  Social History   Substance and Sexual Activity  Alcohol Use Not Currently     Social History  Substance and Sexual Activity  Drug Use Not Currently   Types: "Crack" cocaine, Other-see comments   Comment: Patient reports hx of smoking Crack    Social History   Socioeconomic History   Marital status: Single    Spouse name: Not on file   Number of children: Not on file   Years of education: Not on file    Highest education level: Not on file  Occupational History   Not on file  Tobacco Use   Smoking status: Every Day    Types: Cigars   Smokeless tobacco: Never   Tobacco comments:    Pt declined  Vaping Use   Vaping Use: Never used  Substance and Sexual Activity   Alcohol use: Not Currently   Drug use: Not Currently    Types: "Crack" cocaine, Other-see comments    Comment: Patient reports hx of smoking Crack   Sexual activity: Not Currently    Birth control/protection: None    Comment: occasionally  Other Topics Concern   Not on file  Social History Narrative   ** Merged History Encounter **       Social Determinants of Health   Financial Resource Strain: Not on file  Food Insecurity: Not on file  Transportation Needs: Not on file  Physical Activity: Not on file  Stress: Not on file  Social Connections: Not on file    Tobacco Cessation:  N/A, patient does not currently use tobacco products  Current Medications: Current Facility-Administered Medications  Medication Dose Route Frequency Provider Last Rate Last Admin   amLODipine (NORVASC) tablet 5 mg  5 mg Oral Daily Lacretia Leigh, MD   5 mg at 01/03/22 0902   benztropine (COGENTIN) tablet 1 mg  1 mg Oral BID Lacretia Leigh, MD   1 mg at 01/03/22 0902   DULoxetine (CYMBALTA) DR capsule 60 mg  60 mg Oral Daily Lacretia Leigh, MD       glimepiride (AMARYL) tablet 4 mg  4 mg Oral Q breakfast Lacretia Leigh, MD   4 mg at 01/03/22 0902   haloperidol (HALDOL) tablet 10 mg  10 mg Oral Daily Lindon Romp A, NP   10 mg at 01/03/22 0902   haloperidol (HALDOL) tablet 20 mg  20 mg Oral QHS Lindon Romp A, NP   20 mg at 01/02/22 2257   insulin detemir (LEVEMIR) injection 40 Units  40 Units Subcutaneous BID Lacretia Leigh, MD   40 Units at 01/03/22 0903   Current Outpatient Medications  Medication Sig Dispense Refill   amLODipine (NORVASC) 5 MG tablet Take 5 mg by mouth daily.     atorvastatin (LIPITOR) 20 MG tablet Take 20 mg by  mouth at bedtime.     benztropine (COGENTIN) 0.5 MG tablet Take 0.5 mg by mouth 2 (two) times daily.     busPIRone (BUSPAR) 15 MG tablet Take 15 mg by mouth 3 (three) times daily.     cariprazine (VRAYLAR) 3 MG capsule Take 3 mg by mouth daily.     DULoxetine (CYMBALTA) 60 MG capsule Take 60 mg by mouth daily.     glimepiride (AMARYL) 4 MG tablet Take 1 tablet (4 mg total) by mouth daily with breakfast.     guanFACINE (INTUNIV) 1 MG TB24 ER tablet Take 1 mg by mouth at bedtime.     haloperidol (HALDOL) 10 MG tablet Take 10 mg by mouth at bedtime.     haloperidol (HALDOL) 5 MG tablet Take 5 mg by mouth at bedtime.  haloperidol decanoate (HALDOL DECANOATE) 100 MG/ML injection Inject 150 mg into the muscle every 30 (thirty) days.     insulin detemir (LEVEMIR) 100 UNIT/ML injection Inject 0.4 mLs (40 Units total) into the skin 2 (two) times daily. 10 mL 11   lithium carbonate (ESKALITH) 450 MG CR tablet Take 450 mg by mouth at bedtime.     metoprolol tartrate (LOPRESSOR) 25 MG tablet Take 12.5 mg by mouth 2 (two) times daily.     omeprazole (PRILOSEC) 20 MG capsule Take 20 mg by mouth daily.     ondansetron (ZOFRAN) 4 MG tablet Take 4 mg by mouth every 6 (six) hours as needed for nausea or vomiting.     pantoprazole (PROTONIX) 40 MG tablet Take 40 mg by mouth daily.     PTA Medications: (Not in a hospital admission)   [address]:  [place] ED from 01/02/2022 in [place] DEPT ED from 12/24/2021 in [place] DEPT ED from 12/22/2021 in [place] DEPT  C-SSRS RISK CATEGORY High Risk High Risk Error: Q3, 4, or 5 should not be populated when Q2 is No       Musculoskeletal: Strength & Muscle Tone: within normal limits Gait & Station: normal Patient leans: N/A  Psychiatric Specialty Exam: Presentation  General Appearance: Appropriate for Environment; Neat  Eye Contact:Good  Speech:Clear  and Coherent; Normal Rate  Speech Volume:Normal  Handedness:Left   Mood and Affect  Mood:Euthymic  Affect:Congruent   Thought Process  Thought Processes:Coherent; Goal Directed; Linear  Descriptions of Associations:Intact  Orientation:Full (Time, Place and Person)  Thought Content:Logical  History of Schizophrenia/Schizoaffective disorder:Yes  Duration of Psychotic Symptoms:N/A  Hallucinations:Hallucinations: None  Ideas of Reference:None  Suicidal Thoughts:No  Homicidal Thoughts:Homicidal Thoughts: No HI Passive Intent and/or Plan: Without Intent; Without Plan   Sensorium  Memory:Recent Good; Immediate Good; Remote Fair  Judgment:Fair  Insight:Fair   Executive Functions  Concentration:Good  Attention Span:Good  [place]  Language:Good   Psychomotor Activity  Psychomotor Activity:Psychomotor Activity: Normal   Assets  Assets:Communication Skills; Desire for Improvement; Financial Resources/Insurance; Housing; Social Support   Sleep  Sleep:Sleep: Fair    Physical Exam: Physical Exam Constitutional:      General: She is not in acute distress.    Appearance: She is not ill-appearing, toxic-appearing or diaphoretic.  HENT:     Right Ear: External ear normal.     Left Ear: External ear normal.  Eyes:     General:        Right eye: No discharge.        Left eye: No discharge.  Cardiovascular:     Rate and Rhythm: Normal rate.  Pulmonary:     Effort: Pulmonary effort is normal. No respiratory distress.  Musculoskeletal:        General: Normal range of motion.     Cervical back: Normal range of motion.  Neurological:     Mental Status: She is alert.  Psychiatric:        Behavior: Behavior is cooperative.        Thought Content: Thought content is not paranoid or delusional. Thought content does not include homicidal or suicidal ideation.    Review of Systems  Constitutional:  Negative for chills,  diaphoresis, fever, malaise/fatigue and weight loss.  HENT:  Negative for congestion.   Respiratory:  Negative for cough and shortness of breath.   Cardiovascular:  Negative for chest pain and palpitations.  Gastrointestinal:  Negative  for diarrhea, nausea and vomiting.  Neurological:  Negative for dizziness and seizures.  Psychiatric/Behavioral:  Negative for depression, hallucinations, memory loss, substance abuse and suicidal ideas. The patient is not nervous/anxious and does not have insomnia.   All other systems reviewed and are negative.  Blood pressure 128/84, pulse 98, temperature 98.2 F (36.8 C), temperature source Oral, resp. rate 20, last menstrual period 12/09/2021, SpO2 99 %. There is no height or weight on file to calculate BMI.   Demographic Factors:  Low socioeconomic status, Living alone, and Unemployed  Loss Factors: NA  Historical Factors: Prior suicide attempts and Family history of mental illness or substance abuse  Risk Reduction Factors:   Sense of responsibility to family, Religious beliefs about death, Positive social support, and Positive therapeutic relationship  Continued Clinical Symptoms:  Schizoaffective disorder-bipolar type  Cognitive Features That Contribute To Risk:  None    Suicide Risk:  Mild:  Suicidal ideation of limited frequency, intensity, duration, and specificity.  There are no identifiable plans, no associated intent, mild dysphoria and related symptoms, good self-control (both objective and subjective assessment), few other risk factors, and identifiable protective factors, including available and accessible social support.   Follow-up Information     [facility] Follow up.   Contact information: [address] [place] [address] [place] Follow up.   Specialty: Urgent Care Why: As needed, If symptoms worsen Contact information: [place] [phone]                 Medical Decision Making: Twila Rappa is a 31 year old female with a documented history of schizoaffective disorder, bipolar type and malingering who presented to [facility] on 01/02/22 due to suicidal ideations. Patient is very well known to this provider behavioral health services, and area emergency departments. Patient reports that she is no longer suicidal and requests discharge home. Patient states that she would like to leave soon, so she can [place]." Patient states that she will return to the ED or [place] if suicidal thoughts return or her mood worsens. Patient was initially agitated when she presented to the ED and reported SI with plan to overdose on her medications. She ruminated on returning to [facility] because they were going to assist her with group home placement. Patient has not displayed any aggressive behavior or agitation since her initial arrival to the ED. Patient often presents to the ED or [place] with SI and then denies SI the following day. Based on my experience with this patient, she appears to be at her baseline. Patient states that she will contact her ACT team to deliver her medications.  At time of discharge, patient denies SI, HI, AVH and is able to contract for safety. She demonstrated no overt evidence of psychosis or mania. Prior to discharge the patient verbalized that she understood warning signs, triggers, and symptoms of worsening mental health and how to access emergency mental health care if they felt it was needed. Patient was instructed to call 911 or return to the emergency room if they experienced any concerning symptoms after discharge. Patient voiced understanding and agreed to this.   Problem 1: Schizoaffective Disorder, bipolar type  Disposition: No evidence of imminent risk to self or others at present.   Patient does not meet criteria for psychiatric inpatient  admission. Supportive therapy provided about ongoing stressors. Discussed  crisis plan, support from social network, calling 911, coming to the Emergency Department, and calling Suicide Hotline.     Discharge Instructions       Discharge recommendations:  Patient is to take medications as prescribed. Please see information for follow-up appointment with psychiatry and therapy. Please follow up with your primary care provider for all medical related needs.   Therapy: We recommend that patient participate in individual therapy to address mental health concerns.  Medications: The patient is to contact a medical professional and/or outpatient provider to address any new side effects that develop. Patient should update outpatient providers of any new medications and/or medication changes.   Atypical antipsychotics: If you are prescribed an atypical antipsychotic, it is recommended that your height, weight, BMI, blood pressure, fasting lipid panel, and fasting blood sugar be monitored by your outpatient providers.  Safety:  The patient should abstain from use of illicit substances/drugs and abuse of any medications. If symptoms worsen or do not continue to improve or if the patient becomes actively suicidal or homicidal then it is recommended that the patient return to the closest hospital emergency department, the [facility], or call 911 for further evaluation and treatment. National Suicide Prevention Lifeline 1-800-SUICIDE or (214)483-5502.  About 988 988 offers 24/7 access to trained crisis counselors who can help people experiencing mental health-related distress. People can call or text 988 or chat 988lifeline.org for themselves or if they are worried about a loved one who may need crisis support.  Crisis Mobile: [facility]:                     [phone] (for crisis response 24 hours a day) [place]:                                             [phone]       Rozetta Nunnery, NP 01/03/2022, 3:47 PM

## 2022-01-03 NOTE — ED Notes (Signed)
Pt walked to bathroom w/ steady gait and returned to bed. Pt is resting calmly, no behavioral issues noted at this time.

## 2022-01-04 ENCOUNTER — Other Ambulatory Visit (HOSPITAL_COMMUNITY)
Admission: EM | Admit: 2022-01-04 | Discharge: 2022-01-05 | Disposition: A | Discharge status: Home / Self Care | Payer: Medicaid Other | Attending: Psychiatry | Admitting: Psychiatry

## 2022-01-04 DIAGNOSIS — F411 Generalized anxiety disorder: Secondary | ICD-10-CM | POA: Insufficient documentation

## 2022-01-04 DIAGNOSIS — R45851 Suicidal ideations: Secondary | ICD-10-CM | POA: Insufficient documentation

## 2022-01-04 DIAGNOSIS — F3130 Bipolar disorder, current episode depressed, mild or moderate severity, unspecified: Secondary | ICD-10-CM

## 2022-01-04 DIAGNOSIS — F70 Mild intellectual disabilities: Secondary | ICD-10-CM | POA: Insufficient documentation

## 2022-01-04 DIAGNOSIS — Z20822 Contact with and (suspected) exposure to covid-19: Secondary | ICD-10-CM | POA: Diagnosis not present

## 2022-01-04 DIAGNOSIS — F319 Bipolar disorder, unspecified: Secondary | ICD-10-CM | POA: Diagnosis not present

## 2022-01-04 DIAGNOSIS — F250 Schizoaffective disorder, bipolar type: Secondary | ICD-10-CM | POA: Insufficient documentation

## 2022-01-04 DIAGNOSIS — F603 Borderline personality disorder: Secondary | ICD-10-CM | POA: Insufficient documentation

## 2022-01-04 LAB — POC SARS CORONAVIRUS 2 AG: SARSCOV2ONAVIRUS 2 AG: NEGATIVE

## 2022-01-04 MED ORDER — ATORVASTATIN CALCIUM 10 MG PO TABS
20.0000 mg | ORAL_TABLET | Freq: Every day | ORAL | Status: DC
  Administered 2022-01-04: 20 mg via ORAL
  Filled 2022-01-04: qty 2

## 2022-01-04 MED ORDER — TRAZODONE HCL 50 MG PO TABS: 50.0000 mg | ORAL_TABLET | Freq: Every evening | ORAL | Status: DC | PRN

## 2022-01-04 MED ORDER — ACETAMINOPHEN 325 MG PO TABS: 650.0000 mg | ORAL_TABLET | Freq: Four times a day (QID) | ORAL | Status: DC | PRN

## 2022-01-04 MED ORDER — GLIMEPIRIDE 2 MG PO TABS
4.0000 mg | ORAL_TABLET | Freq: Every day | ORAL | Status: DC
  Administered 2022-01-05: 4 mg via ORAL
  Filled 2022-01-04: qty 2

## 2022-01-04 MED ORDER — AMLODIPINE BESYLATE 5 MG PO TABS
5.0000 mg | ORAL_TABLET | Freq: Every day | ORAL | Status: DC
  Administered 2022-01-05: 5 mg via ORAL
  Filled 2022-01-04 (×2): qty 1

## 2022-01-04 MED ORDER — BUSPIRONE HCL 15 MG PO TABS
15.0000 mg | ORAL_TABLET | Freq: Three times a day (TID) | ORAL | Status: DC
  Filled 2022-01-04 (×2): qty 1

## 2022-01-04 MED ORDER — HALOPERIDOL 5 MG PO TABS
10.0000 mg | ORAL_TABLET | Freq: Every day | ORAL | Status: DC
  Administered 2022-01-04: 10 mg via ORAL
  Filled 2022-01-04: qty 2

## 2022-01-04 MED ORDER — ALUM & MAG HYDROXIDE-SIMETH 200-200-20 MG/5ML PO SUSP: 30.0000 mL | ORAL | Status: DC | PRN

## 2022-01-04 MED ORDER — MAGNESIUM HYDROXIDE 400 MG/5ML PO SUSP: 30.0000 mL | Freq: Every day | ORAL | Status: DC | PRN

## 2022-01-04 MED ORDER — CLONIDINE HCL 0.1 MG PO TABS
0.1000 mg | ORAL_TABLET | Freq: Once | ORAL | Status: AC
  Administered 2022-01-04: 0.1 mg via ORAL
  Filled 2022-01-04: qty 1

## 2022-01-04 MED ORDER — BENZTROPINE MESYLATE 0.5 MG PO TABS
0.5000 mg | ORAL_TABLET | Freq: Two times a day (BID) | ORAL | Status: DC
  Administered 2022-01-04 – 2022-01-05 (×2): 0.5 mg via ORAL
  Filled 2022-01-04 (×2): qty 1

## 2022-01-04 NOTE — ED Notes (Signed)
Pt awake and sitting on her bed, calm and cooperative. No c/o pain or distress. Will continue to monitor for safety

## 2022-01-04 NOTE — ED Notes (Signed)
Pt A&O x 4, presents with suicidal ideations, plan to overdose on meds.  Denies HI or AVH.  Pt calm & cooperative, monitoring for safety.

## 2022-01-04 NOTE — ED Provider Notes (Cosign Needed Addendum)
[facility] Continuous Assessment Admission H&P  Date: 01/04/22 Patient Name: [name] MRN: [id number]  Chief Complaint: Suicidal ideations with a plan to overdose home medications  Diagnoses:  Final diagnoses:  Bipolar I disorder, most recent episode depressed [facility])    HPI:  [name] 31 year old African-American female well-known to this service presents with suicidal ideations with a plan to overdose on pills.  She was recently discharged from inpatient admission at [facility] however she states " I need something done for my medications daily did not change anything and I cannot go on living like this".  [name] reports "if you discharge me then I will end up in the ICU I promise you that. "  She reports she is followed by [facility] Dr. Sheppard Evens for medication management.  Patient observed yelling throughout the lobby reporting plans to overdose.  She has a charted history with schizoaffective disorder, major depressive disorder, borderline personality disorder, generalized anxiety disorder and mild intellectual disability disorder.  Will recommend overnight observation. Will restart home medications where appropriate.  Support, encouragement and reassurance provided.  During evaluation [name] is siting in no acute distress.She is alert/oriented x 3 calm/cooperative and mood labile and anxious. She is speaking in a clear tone at moderate volume, and normal pace; with good eye contact. Her thought process is coherent and relevant; There is no indication that she is currently responding to internal/external stimuli or experiencing delusional thought content; and she has denied homicidal ideation, psychosis, and paranoia.          PHQ 2-9:  [place] ED from 10/24/2021 in [place] from 07/05/2020 in [facility] [place] [facility] from 05/26/2020 in [place] DEPT  Thoughts that you would be better off dead, or of hurting yourself in some way Several days Not at all More than half the days  PHQ-9 Total Score '7 4 18       '$ Flowsheet Row ED from 01/04/2022 in [facility] ED from 01/02/2022 in [place] [facility] ED from 12/24/2021 in [place] [facility]PT  C-SSRS RISK CATEGORY High Risk High Risk High Risk        Total Time spent with patient: 15 minutes  Musculoskeletal  Strength & Muscle Tone: within normal limits Gait & Station: normal Patient leans: N/A  Psychiatric Specialty Exam  Presentation General Appearance: Appropriate for Environment; Neat  Eye Contact:Good  Speech:Clear and Coherent; Normal Rate  Speech Volume:Normal  Handedness:Left   Mood and Affect  Mood:Euthymic  Affect:Congruent   Thought Process  Thought Processes:Coherent; Goal Directed; Linear  Descriptions of Associations:Intact  Orientation:Full (Time, Place and Person)  Thought Content:Logical  Diagnosis of Schizophrenia or Schizoaffective disorder in past: Yes  Duration of Psychotic Symptoms: Less than six months  Hallucinations:Hallucinations: None  Ideas of Reference:None  Suicidal Thoughts:Suicidal Thoughts: No  Homicidal Thoughts:Homicidal Thoughts: No   Sensorium  Memory:Recent Good; Immediate Good; Remote Fair  Judgment:Fair  Insight:Fair   Executive Functions  Concentration:Good  Attention Span:Good  Recall:Good  Fund of Knowledge:Fair  Language:Good   Psychomotor Activity  Psychomotor Activity:Psychomotor Activity: Normal   Assets  Assets:Communication Skills; Desire for Improvement; Financial Resources/Insurance; Housing; Social Support   Sleep  Sleep:Sleep: Fair   No data recorded  Physical Exam Vitals and nursing note reviewed.  Cardiovascular:     Rate and Rhythm: Normal rate and regular  rhythm.  Neurological:     Mental Status: She is alert and oriented to person, place, and time.  Psychiatric:        Attention and Perception: Attention and perception normal.        Mood and Affect: Mood normal.        Speech: Speech normal.        Behavior: Behavior normal. Behavior is cooperative.        Thought Content: Thought content includes suicidal ideation.        Cognition and Memory: Cognition and memory normal.        Judgment: Judgment normal.    Review of Systems  Constitutional: Negative.   Eyes: Negative.   Respiratory: Negative.    Cardiovascular: Negative.   Genitourinary: Negative.   Endo/Heme/Allergies: Negative.   Psychiatric/Behavioral:  Positive for suicidal ideas. The patient is nervous/anxious.     Blood pressure (!) 127/92, pulse 97, temperature 98.6 F (37 C), temperature source Oral, resp. rate 18, last menstrual period 12/09/2021, SpO2 98 %. There is no height or weight on file to calculate BMI.  Past Psychiatric History:    Is the patient at risk to self? No  Has the patient been a risk to self in the past 6 months? No .    Has the patient been a risk to self within the distant past? No   Is the patient a risk to others? No   Has the patient been a risk to others in the past 6 months? Yes   Has the patient been a risk to others within the distant past? Yes   Past Medical History:  Past Medical History:  Diagnosis Date   Anxiety    Bipolar 1 disorder ([place])    Cognitive deficits    Depression    Diabetes mellitus without complication ([place])    Hypertension    Mental disorder    Mental health disorder    Obesity     Past Surgical History:  Procedure Laterality Date   CESAREAN SECTION     CESAREAN SECTION N/A 04/25/2013   Procedure: REPEAT CESAREAN SECTION;  Surgeon: [name], MD;  Location: [facility];  Service: Obstetrics;  Laterality: N/A;   MASS EXCISION N/A 06/03/2012   Procedure: EXCISION MASS;  Surgeon: [name], MD;   Location: [place];  Service: ENT;  Laterality: N/A;  Excision uvula mass   TONSILLECTOMY N/A 06/03/2012   Procedure: TONSILLECTOMY;  Surgeon: [name], MD;  Location: [place];  Service: ENT;  Laterality: N/A;   TONSILLECTOMY      Family History:  Family History  Problem Relation Age of Onset   Hypertension Mother    Diabetes Father     Social History:  Social History   Socioeconomic History   Marital status: Single    Spouse name: Not on file   Number of children: Not on file   Years of education: Not on file   Highest education level: Not on file  Occupational History   Not on file  Tobacco Use   Smoking status: Every Day    Types: Cigars   Smokeless tobacco: Never   Tobacco comments:    Pt declined  Vaping Use   Vaping Use: Never used  Substance and Sexual Activity   Alcohol use: Not Currently   Drug use: Not Currently    Types: "Crack" cocaine, Other-see comments    Comment: Patient reports hx of smoking Crack   Sexual activity: Not Currently  Birth control/protection: None    Comment: occasionally  Other Topics Concern   Not on file  Social History Narrative   ** Merged History Encounter **       Social Determinants of Health   Financial Resource Strain: Not on file  Food Insecurity: Not on file  Transportation Needs: Not on file  Physical Activity: Not on file  Stress: Not on file  Social Connections: Not on file  Intimate Partner Violence: Not on file    SDOH:  SDOH Screenings   Alcohol Screen: Low Risk  (05/02/2020)  Depression (PHQ2-9): Medium Risk (10/24/2021)  Tobacco Use: High Risk (01/02/2022)    Last Labs:  Admission on 01/02/2022, Discharged on 01/03/2022  Component Date Value Ref Range Status   WBC 01/02/2022 8.5  4.0 - 10.5 K/uL Final   RBC 01/02/2022 4.21  3.87 - 5.11 MIL/uL Final   Hemoglobin 01/02/2022 10.4 (L)  12.0 - 15.0 g/dL Final   HCT 01/02/2022 33.8 (L)  36.0 - 46.0 % Final   MCV  01/02/2022 80.3  80.0 - 100.0 fL Final   MCH 01/02/2022 24.7 (L)  26.0 - 34.0 pg Final   MCHC 01/02/2022 30.8  30.0 - 36.0 g/dL Final   RDW 01/02/2022 15.9 (H)  11.5 - 15.5 % Final   Platelets 01/02/2022 205  150 - 400 K/uL Final   nRBC 01/02/2022 0.0  0.0 - 0.2 % Final   Neutrophils Relative % 01/02/2022 50  % Final   Neutro Abs 01/02/2022 4.2  1.7 - 7.7 K/uL Final   Lymphocytes Relative 01/02/2022 37  % Final   Lymphs Abs 01/02/2022 3.2  0.7 - 4.0 K/uL Final   Monocytes Relative 01/02/2022 5  % Final   Monocytes Absolute 01/02/2022 0.4  0.1 - 1.0 K/uL Final   Eosinophils Relative 01/02/2022 7  % Final   Eosinophils Absolute 01/02/2022 0.6 (H)  0.0 - 0.5 K/uL Final   Basophils Relative 01/02/2022 1  % Final   Basophils Absolute 01/02/2022 0.1  0.0 - 0.1 K/uL Final   Immature Granulocytes 01/02/2022 0  % Final   Abs Immature Granulocytes 01/02/2022 0.03  0.00 - 0.07 K/uL Final   Performed at [facility], [place] [address], [place], [place] [address]   Sodium 01/02/2022 139  135 - 145 mmol/L Final   Potassium 01/02/2022 3.5  3.5 - 5.1 mmol/L Final   Chloride 01/02/2022 110  98 - 111 mmol/L Final   CO2 01/02/2022 25  22 - 32 mmol/L Final   Glucose, Bld 01/02/2022 178 (H)  70 - 99 mg/dL Final   Glucose reference range applies only to samples taken after fasting for at least 8 hours.   BUN 01/02/2022 11  6 - 20 mg/dL Final   Creatinine, Ser 01/02/2022 0.78  0.44 - 1.00 mg/dL Final   Calcium 01/02/2022 8.8 (L)  8.9 - 10.3 mg/dL Final   Total Protein 01/02/2022 6.8  6.5 - 8.1 g/dL Final   Albumin 01/02/2022 3.8  3.5 - 5.0 g/dL Final   AST 01/02/2022 22  15 - 41 U/L Final   ALT 01/02/2022 21  0 - 44 U/L Final   Alkaline Phosphatase 01/02/2022 49  38 - 126 U/L Final   Total Bilirubin 01/02/2022 0.4  0.3 - 1.2 mg/dL Final   GFR, Estimated 01/02/2022 >60  >60 mL/min Final   Comment: (NOTE) Calculated using the CKD-EPI Creatinine Equation (2021)    Anion gap 01/02/2022 4  (L)  5 - 15 Final  Performed at [facility], [place] [address], [place], [place] [address]   Alcohol, Ethyl (B) 01/02/2022 <10  <10 mg/dL Final   Comment: (NOTE) Lowest detectable limit for serum alcohol is 10 mg/dL.  For medical purposes only. Performed at [facility], [place] [address] [place], [place] [address]    Opiates 01/02/2022 NONE DETECTED  NONE DETECTED Final   Cocaine 01/02/2022 NONE DETECTED  NONE DETECTED Final   Benzodiazepines 01/02/2022 NONE DETECTED  NONE DETECTED Final   Amphetamines 01/02/2022 NONE DETECTED  NONE DETECTED Final   Tetrahydrocannabinol 01/02/2022 NONE DETECTED  NONE DETECTED Final   Barbiturates 01/02/2022 NONE DETECTED  NONE DETECTED Final   Comment: (NOTE) DRUG SCREEN FOR MEDICAL PURPOSES ONLY.  IF CONFIRMATION IS NEEDED FOR ANY PURPOSE, NOTIFY LAB WITHIN 5 DAYS.  LOWEST DETECTABLE LIMITS FOR URINE DRUG SCREEN Drug Class                     Cutoff (ng/mL) Amphetamine and metabolites    1000 Barbiturate and metabolites    200 Benzodiazepine                 361 Tricyclics and metabolites     300 Opiates and metabolites        300 Cocaine and metabolites        300 THC                            50 Performed at [facility], [place] [address], [place], [place] [address]    Lithium Lvl 01/02/2022 0.29 (L)  0.60 - 1.20 mmol/L Final   Performed at [place] [address] [place], [place] [address]   SARS Coronavirus 2 by RT PCR 01/02/2022 NEGATIVE  NEGATIVE Final   Comment: (NOTE) SARS-CoV-2 target nucleic acids are NOT DETECTED.  The SARS-CoV-2 RNA is generally detectable in upper respiratory specimens during the acute phase of infection. The lowest concentration of SARS-CoV-2 viral copies this assay can detect is 138 copies/mL. A negative result does not preclude SARS-Cov-2 infection and should not be used as the sole basis for treatment or other patient  management decisions. A negative result may occur with  improper specimen collection/handling, submission of specimen other than nasopharyngeal swab, presence of viral mutation(s) within the areas targeted by this assay, and inadequate number of viral copies(<138 copies/mL). A negative result must be combined with clinical observations, patient history, and epidemiological information. The expected result is Negative.  Fact Sheet for Patients:  EntrepreneurPulse.com.au  Fact Sheet for Healthcare Providers:  IncredibleEmployment.be  This test is no                          t yet approved or cleared by the [place] FDA and  has been authorized for detection and/or diagnosis of SARS-CoV-2 by FDA under an Emergency Use Authorization (EUA). This EUA will remain  in effect (meaning this test can be used) for the duration of the COVID-19 declaration under Section 564(b)(1) of the Act, 21 U.S.C.section 360bbb-3(b)(1), unless the authorization is terminated  or revoked sooner.       Influenza A by PCR 01/02/2022 NEGATIVE  NEGATIVE Final   Influenza B by PCR 01/02/2022 NEGATIVE  NEGATIVE Final   Comment: (NOTE) The Xpert Xpress SARS-CoV-2/FLU/RSV plus assay is intended as an aid in the diagnosis of influenza from Nasopharyngeal swab specimens  and should not be used as a sole basis for treatment. Nasal washings and aspirates are unacceptable for Xpert Xpress SARS-CoV-2/FLU/RSV testing.  Fact Sheet for Patients: EntrepreneurPulse.com.au  Fact Sheet for Healthcare Providers: IncredibleEmployment.be  This test is not yet approved or cleared by the [place] FDA and has been authorized for detection and/or diagnosis of SARS-CoV-2 by FDA under an Emergency Use Authorization (EUA). This EUA will remain in effect (meaning this test can be used) for the duration of the COVID-19 declaration under Section 564(b)(1)  of the Act, 21 U.S.C. section 360bbb-3(b)(1), unless the authorization is terminated or revoked.  Performed at [facility], [place] [address] [place], [place] [address]    hCG, Ollen Barges, Quant, S 01/02/2022 <1  <5 mIU/mL Final   Comment:          GEST. AGE      CONC.  (mIU/mL)   <=1 WEEK        5 - 50     2 WEEKS       50 - 500     3 WEEKS       100 - 10,000     4 WEEKS     1,000 - 30,000     5 WEEKS     3,500 - 115,000   6-8 WEEKS     12,000 - 270,000    12 WEEKS     15,000 - 220,000        FEMALE AND NON-PREGNANT FEMALE:     LESS THAN 5 mIU/mL Performed at [facility], [place] [address] [place], [place] [address]    Glucose-Capillary 01/03/2022 176 (H)  70 - 99 mg/dL Final   Glucose reference range applies only to samples taken after fasting for at least 8 hours.   Glucose-Capillary 01/03/2022 164 (H)  70 - 99 mg/dL Final   Glucose reference range applies only to samples taken after fasting for at least 8 hours.  Admission on 12/24/2021, Discharged on 12/25/2021  Component Date Value Ref Range Status   SARS Coronavirus 2 by RT PCR 12/24/2021 NEGATIVE  NEGATIVE Final   Comment: (NOTE) SARS-CoV-2 target nucleic acids are NOT DETECTED.  The SARS-CoV-2 RNA is generally detectable in upper respiratory specimens during the acute phase of infection. The lowest concentration of SARS-CoV-2 viral copies this assay can detect is 138 copies/mL. A negative result does not preclude SARS-Cov-2 infection and should not be used as the sole basis for treatment or other patient management decisions. A negative result may occur with  improper specimen collection/handling, submission of specimen other than nasopharyngeal swab, presence of viral mutation(s) within the areas targeted by this assay, and inadequate number of viral copies(<138 copies/mL). A negative result must be combined with clinical observations, patient history, and  epidemiological information. The expected result is Negative.  Fact Sheet for Patients:  EntrepreneurPulse.com.au  Fact Sheet for Healthcare Providers:  IncredibleEmployment.be  This test is no                          t yet approved or cleared by the [place] FDA and  has been authorized for detection and/or diagnosis of SARS-CoV-2 by FDA under an Emergency Use Authorization (EUA). This EUA will remain  in effect (meaning this test can be used) for the duration of the COVID-19 declaration under Section 564(b)(1) of the Act, 21 U.S.C.section 360bbb-3(b)(1), unless the authorization is terminated  or revoked sooner.  Influenza A by PCR 12/24/2021 NEGATIVE  NEGATIVE Final   Influenza B by PCR 12/24/2021 NEGATIVE  NEGATIVE Final   Comment: (NOTE) The Xpert Xpress SARS-CoV-2/FLU/RSV plus assay is intended as an aid in the diagnosis of influenza from Nasopharyngeal swab specimens and should not be used as a sole basis for treatment. Nasal washings and aspirates are unacceptable for Xpert Xpress SARS-CoV-2/FLU/RSV testing.  Fact Sheet for Patients: EntrepreneurPulse.com.au  Fact Sheet for Healthcare Providers: IncredibleEmployment.be  This test is not yet approved or cleared by the [place] FDA and has been authorized for detection and/or diagnosis of SARS-CoV-2 by FDA under an Emergency Use Authorization (EUA). This EUA will remain in effect (meaning this test can be used) for the duration of the COVID-19 declaration under Section 564(b)(1) of the Act, 21 U.S.C. section 360bbb-3(b)(1), unless the authorization is terminated or revoked.  Performed at [facility], [place] [address] [place], [place] [address]    Sodium 12/24/2021 141  135 - 145 mmol/L Final   Potassium 12/24/2021 3.6  3.5 - 5.1 mmol/L Final   Chloride 12/24/2021 109  98 - 111 mmol/L Final   CO2 12/24/2021 23   22 - 32 mmol/L Final   Glucose, Bld 12/24/2021 163 (H)  70 - 99 mg/dL Final   Glucose reference range applies only to samples taken after fasting for at least 8 hours.   BUN 12/24/2021 10  6 - 20 mg/dL Final   Creatinine, Ser 12/24/2021 0.92  0.44 - 1.00 mg/dL Final   Calcium 12/24/2021 9.9  8.9 - 10.3 mg/dL Final   Total Protein 12/24/2021 8.0  6.5 - 8.1 g/dL Final   Albumin 12/24/2021 4.4  3.5 - 5.0 g/dL Final   AST 12/24/2021 25  15 - 41 U/L Final   ALT 12/24/2021 19  0 - 44 U/L Final   Alkaline Phosphatase 12/24/2021 46  38 - 126 U/L Final   Total Bilirubin 12/24/2021 0.6  0.3 - 1.2 mg/dL Final   GFR, Estimated 12/24/2021 >60  >60 mL/min Final   Comment: (NOTE) Calculated using the CKD-EPI Creatinine Equation (2021)    Anion gap 12/24/2021 9  5 - 15 Final   Performed at [facility], [place] [address], [place], [place] [address]   Alcohol, Ethyl (B) 12/24/2021 <10  <10 mg/dL Final   Comment: (NOTE) Lowest detectable limit for serum alcohol is 10 mg/dL.  For medical purposes only. Performed at [facility], [place] [address] [place], [place] [address]    WBC 12/24/2021 12.8 (H)  4.0 - 10.5 K/uL Final   RBC 12/24/2021 4.90  3.87 - 5.11 MIL/uL Final   Hemoglobin 12/24/2021 12.1  12.0 - 15.0 g/dL Final   HCT 12/24/2021 38.5  36.0 - 46.0 % Final   MCV 12/24/2021 78.6 (L)  80.0 - 100.0 fL Final   MCH 12/24/2021 24.7 (L)  26.0 - 34.0 pg Final   MCHC 12/24/2021 31.4  30.0 - 36.0 g/dL Final   RDW 12/24/2021 15.9 (H)  11.5 - 15.5 % Final   Platelets 12/24/2021 283  150 - 400 K/uL Final   nRBC 12/24/2021 0.0  0.0 - 0.2 % Final   Neutrophils Relative % 12/24/2021 67  % Final   Neutro Abs 12/24/2021 8.5 (H)  1.7 - 7.7 K/uL Final   Lymphocytes Relative 12/24/2021 22  % Final   Lymphs Abs 12/24/2021 2.9  0.7 - 4.0 K/uL Final   Monocytes Relative 12/24/2021 6  % Final  Monocytes Absolute 12/24/2021 0.8  0.1 - 1.0 K/uL Final   Eosinophils Relative  12/24/2021 4  % Final   Eosinophils Absolute 12/24/2021 0.5  0.0 - 0.5 K/uL Final   Basophils Relative 12/24/2021 1  % Final   Basophils Absolute 12/24/2021 0.1  0.0 - 0.1 K/uL Final   Immature Granulocytes 12/24/2021 0  % Final   Abs Immature Granulocytes 12/24/2021 0.03  0.00 - 0.07 K/uL Final   Performed at [facility], [place] [address], [place], [place] [address]   I-stat hCG, quantitative 12/24/2021 <5.0  <5 mIU/mL Final   Comment 3 12/24/2021          Final   Comment:   GEST. AGE      CONC.  (mIU/mL)   <=1 WEEK        5 - 50     2 WEEKS       50 - 500     3 WEEKS       100 - 10,000     4 WEEKS     1,000 - 30,000        FEMALE AND NON-PREGNANT FEMALE:     LESS THAN 5 mIU/mL    Glucose-Capillary 12/24/2021 160 (H)  70 - 99 mg/dL Final   Glucose reference range applies only to samples taken after fasting for at least 8 hours.  Admission on 12/15/2021, Discharged on 12/16/2021  Component Date Value Ref Range Status   Lipase 12/15/2021 26  11 - 51 U/L Final   Performed at [facility], [place] [address], [place], [place] [address]   Sodium 12/15/2021 138  135 - 145 mmol/L Final   Potassium 12/15/2021 3.0 (L)  3.5 - 5.1 mmol/L Final   Chloride 12/15/2021 105  98 - 111 mmol/L Final   CO2 12/15/2021 24  22 - 32 mmol/L Final   Glucose, Bld 12/15/2021 152 (H)  70 - 99 mg/dL Final   Glucose reference range applies only to samples taken after fasting for at least 8 hours.   BUN 12/15/2021 8  6 - 20 mg/dL Final   Creatinine, Ser 12/15/2021 0.81  0.44 - 1.00 mg/dL Final   Calcium 12/15/2021 9.0  8.9 - 10.3 mg/dL Final   Total Protein 12/15/2021 7.1  6.5 - 8.1 g/dL Final   Albumin 12/15/2021 3.9  3.5 - 5.0 g/dL Final   AST 12/15/2021 18  15 - 41 U/L Final   ALT 12/15/2021 17  0 - 44 U/L Final   Alkaline Phosphatase 12/15/2021 49  38 - 126 U/L Final   Total Bilirubin 12/15/2021 0.6  0.3 - 1.2 mg/dL Final   GFR, Estimated 12/15/2021 >60  >60 mL/min Final   Comment:  (NOTE) Calculated using the CKD-EPI Creatinine Equation (2021)    Anion gap 12/15/2021 9  5 - 15 Final   Performed at [facility], [place] [address] [place], [place] [address]   WBC 12/15/2021 11.9 (H)  4.0 - 10.5 K/uL Final   RBC 12/15/2021 4.39  3.87 - 5.11 MIL/uL Final   Hemoglobin 12/15/2021 11.0 (L)  12.0 - 15.0 g/dL Final   HCT 12/15/2021 34.8 (L)  36.0 - 46.0 % Final   MCV 12/15/2021 79.3 (L)  80.0 - 100.0 fL Final   MCH 12/15/2021 25.1 (L)  26.0 - 34.0 pg Final   MCHC 12/15/2021 31.6  30.0 - 36.0 g/dL Final   RDW 12/15/2021 15.7 (H)  11.5 - 15.5 % Final  Platelets 12/15/2021 245  150 - 400 K/uL Final   REPEATED TO VERIFY   nRBC 12/15/2021 0.0  0.0 - 0.2 % Final   Performed at [facility], [place] [address] [place], [place] [address]   I-stat hCG, quantitative 12/15/2021 <5.0  <5 mIU/mL Final   Comment 3 12/15/2021          Final   Comment:   GEST. AGE      CONC.  (mIU/mL)   <=1 WEEK        5 - 50     2 WEEKS       50 - 500     3 WEEKS       100 - 10,000     4 WEEKS     1,000 - 30,000        FEMALE AND NON-PREGNANT FEMALE:     LESS THAN 5 mIU/mL   Admission on 12/12/2021, Discharged on 12/12/2021  Component Date Value Ref Range Status   Sodium 12/12/2021 144  135 - 145 mmol/L Final   Potassium 12/12/2021 3.7  3.5 - 5.1 mmol/L Final   Chloride 12/12/2021 112 (H)  98 - 111 mmol/L Final   CO2 12/12/2021 27  22 - 32 mmol/L Final   Glucose, Bld 12/12/2021 107 (H)  70 - 99 mg/dL Final   Glucose reference range applies only to samples taken after fasting for at least 8 hours.   BUN 12/12/2021 10  6 - 20 mg/dL Final   Creatinine, Ser 12/12/2021 0.83  0.44 - 1.00 mg/dL Final   Calcium 12/12/2021 9.5  8.9 - 10.3 mg/dL Final   GFR, Estimated 12/12/2021 >60  >60 mL/min Final   Comment: (NOTE) Calculated using the CKD-EPI Creatinine Equation (2021)    Anion gap 12/12/2021 5  5 - 15 Final   Performed at [facility], [place] [address] [place],  [place] [address]   WBC 12/12/2021 12.8 (H)  4.0 - 10.5 K/uL Final   RBC 12/12/2021 4.65  3.87 - 5.11 MIL/uL Final   Hemoglobin 12/12/2021 11.5 (L)  12.0 - 15.0 g/dL Final   HCT 12/12/2021 37.6  36.0 - 46.0 % Final   MCV 12/12/2021 80.9  80.0 - 100.0 fL Final   MCH 12/12/2021 24.7 (L)  26.0 - 34.0 pg Final   MCHC 12/12/2021 30.6  30.0 - 36.0 g/dL Final   RDW 12/12/2021 15.9 (H)  11.5 - 15.5 % Final   Platelets 12/12/2021 313  150 - 400 K/uL Final   nRBC 12/12/2021 0.0  0.0 - 0.2 % Final   Neutrophils Relative % 12/12/2021 53  % Final   Neutro Abs 12/12/2021 6.9  1.7 - 7.7 K/uL Final   Lymphocytes Relative 12/12/2021 36  % Final   Lymphs Abs 12/12/2021 4.6 (H)  0.7 - 4.0 K/uL Final   Monocytes Relative 12/12/2021 6  % Final   Monocytes Absolute 12/12/2021 0.8  0.1 - 1.0 K/uL Final   Eosinophils Relative 12/12/2021 4  % Final   Eosinophils Absolute 12/12/2021 0.5  0.0 - 0.5 K/uL Final   Basophils Relative 12/12/2021 1  % Final   Basophils Absolute 12/12/2021 0.1  0.0 - 0.1 K/uL Final   Immature Granulocytes 12/12/2021 0  % Final   Abs Immature Granulocytes 12/12/2021 0.03  0.00 - 0.07 K/uL Final   Performed at [facility], [place] [address] [place], [place] [address]  Admission on 12/11/2021, Discharged on 12/11/2021  Component Date Value Ref  Range Status   Sodium 12/11/2021 141  135 - 145 mmol/L Final   Potassium 12/11/2021 3.6  3.5 - 5.1 mmol/L Final   Chloride 12/11/2021 107  98 - 111 mmol/L Final   CO2 12/11/2021 27  22 - 32 mmol/L Final   Glucose, Bld 12/11/2021 184 (H)  70 - 99 mg/dL Final   Glucose reference range applies only to samples taken after fasting for at least 8 hours.   BUN 12/11/2021 13  6 - 20 mg/dL Final   Creatinine, Ser 12/11/2021 0.81  0.44 - 1.00 mg/dL Final   Calcium 12/11/2021 8.9  8.9 - 10.3 mg/dL Final   Total Protein 12/11/2021 7.9  6.5 - 8.1 g/dL Final   Albumin 12/11/2021 4.0  3.5 - 5.0 g/dL Final   AST 12/11/2021 20  15 - 41 U/L Final    ALT 12/11/2021 20  0 - 44 U/L Final   Alkaline Phosphatase 12/11/2021 53  38 - 126 U/L Final   Total Bilirubin 12/11/2021 0.6  0.3 - 1.2 mg/dL Final   GFR, Estimated 12/11/2021 >60  >60 mL/min Final   Comment: (NOTE) Calculated using the CKD-EPI Creatinine Equation (2021)    Anion gap 12/11/2021 7  5 - 15 Final   Performed at [facility], [place] [address], [place], [place] [address]   WBC 12/11/2021 8.8  4.0 - 10.5 K/uL Final   RBC 12/11/2021 4.52  3.87 - 5.11 MIL/uL Final   Hemoglobin 12/11/2021 11.2 (L)  12.0 - 15.0 g/dL Final   HCT 12/11/2021 36.5  36.0 - 46.0 % Final   MCV 12/11/2021 80.8  80.0 - 100.0 fL Final   MCH 12/11/2021 24.8 (L)  26.0 - 34.0 pg Final   MCHC 12/11/2021 30.7  30.0 - 36.0 g/dL Final   RDW 12/11/2021 15.5  11.5 - 15.5 % Final   Platelets 12/11/2021 268  150 - 400 K/uL Final   nRBC 12/11/2021 0.0  0.0 - 0.2 % Final   Neutrophils Relative % 12/11/2021 61  % Final   Neutro Abs 12/11/2021 5.3  1.7 - 7.7 K/uL Final   Lymphocytes Relative 12/11/2021 29  % Final   Lymphs Abs 12/11/2021 2.6  0.7 - 4.0 K/uL Final   Monocytes Relative 12/11/2021 5  % Final   Monocytes Absolute 12/11/2021 0.4  0.1 - 1.0 K/uL Final   Eosinophils Relative 12/11/2021 4  % Final   Eosinophils Absolute 12/11/2021 0.4  0.0 - 0.5 K/uL Final   Basophils Relative 12/11/2021 1  % Final   Basophils Absolute 12/11/2021 0.1  0.0 - 0.1 K/uL Final   Immature Granulocytes 12/11/2021 0  % Final   Abs Immature Granulocytes 12/11/2021 0.03  0.00 - 0.07 K/uL Final   Performed at [facility], [place] [address], [place], [place] [address]   Troponin I (High Sensitivity) 12/11/2021 3  <18 ng/L Final   Comment: (NOTE) Elevated high sensitivity troponin I (hsTnI) values and significant  changes across serial measurements may suggest ACS but many other  chronic and acute conditions are known to elevate hsTnI results.  Refer to the "Links" section for chest pain algorithms  and additional  guidance. Performed at [facility], [place] [address], [place], [place] [address]    I-stat hCG, quantitative 12/11/2021 <5.0  <5 mIU/mL Final   Comment 3 12/11/2021          Final   Comment:   GEST. AGE      CONC.  (mIU/mL)   <=  1 WEEK        5 - 50     2 WEEKS       50 - 500     3 WEEKS       100 - 10,000     4 WEEKS     1,000 - 30,000        FEMALE AND NON-PREGNANT FEMALE:     LESS THAN 5 mIU/mL   Admission on 11/28/2021, Discharged on 11/30/2021  Component Date Value Ref Range Status   SARS Coronavirus 2 by RT PCR 11/28/2021 NEGATIVE  NEGATIVE Final   Comment: (NOTE) SARS-CoV-2 target nucleic acids are NOT DETECTED.  The SARS-CoV-2 RNA is generally detectable in upper respiratory specimens during the acute phase of infection. The lowest concentration of SARS-CoV-2 viral copies this assay can detect is 138 copies/mL. A negative result does not preclude SARS-Cov-2 infection and should not be used as the sole basis for treatment or other patient management decisions. A negative result may occur with  improper specimen collection/handling, submission of specimen other than nasopharyngeal swab, presence of viral mutation(s) within the areas targeted by this assay, and inadequate number of viral copies(<138 copies/mL). A negative result must be combined with clinical observations, patient history, and epidemiological information. The expected result is Negative.  Fact Sheet for Patients:  EntrepreneurPulse.com.au  Fact Sheet for Healthcare Providers:  IncredibleEmployment.be  This test is no                          t yet approved or cleared by the [place] FDA and  has been authorized for detection and/or diagnosis of SARS-CoV-2 by FDA under an Emergency Use Authorization (EUA). This EUA will remain  in effect (meaning this test can be used) for the duration of the COVID-19 declaration under Section  564(b)(1) of the Act, 21 U.S.C.section 360bbb-3(b)(1), unless the authorization is terminated  or revoked sooner.       Influenza A by PCR 11/28/2021 NEGATIVE  NEGATIVE Final   Influenza B by PCR 11/28/2021 NEGATIVE  NEGATIVE Final   Comment: (NOTE) The Xpert Xpress SARS-CoV-2/FLU/RSV plus assay is intended as an aid in the diagnosis of influenza from Nasopharyngeal swab specimens and should not be used as a sole basis for treatment. Nasal washings and aspirates are unacceptable for Xpert Xpress SARS-CoV-2/FLU/RSV testing.  Fact Sheet for Patients: EntrepreneurPulse.com.au  Fact Sheet for Healthcare Providers: IncredibleEmployment.be  This test is not yet approved or cleared by the [place] FDA and has been authorized for detection and/or diagnosis of SARS-CoV-2 by FDA under an Emergency Use Authorization (EUA). This EUA will remain in effect (meaning this test can be used) for the duration of the COVID-19 declaration under Section 564(b)(1) of the Act, 21 U.S.C. section 360bbb-3(b)(1), unless the authorization is terminated or revoked.  Performed at [facility], [place] [address], [place], [place] [address]    Alcohol, Ethyl (B) 11/28/2021 <10  <10 mg/dL Final   Comment: (NOTE) Lowest detectable limit for serum alcohol is 10 mg/dL.  For medical purposes only. Performed at [facility], [place] [address], [place], [place] [address]    Opiates 11/28/2021 NONE DETECTED  NONE DETECTED Final   Cocaine 11/28/2021 NONE DETECTED  NONE DETECTED Final   Benzodiazepines 11/28/2021 NONE DETECTED  NONE DETECTED Final   Amphetamines 11/28/2021 NONE DETECTED  NONE DETECTED Final   Tetrahydrocannabinol 11/28/2021 NONE DETECTED  NONE DETECTED Final   Barbiturates 11/28/2021  NONE DETECTED  NONE DETECTED Final   Comment: (NOTE) DRUG SCREEN FOR MEDICAL PURPOSES ONLY.  IF CONFIRMATION IS NEEDED FOR ANY PURPOSE, NOTIFY LAB WITHIN 5  DAYS.  LOWEST DETECTABLE LIMITS FOR URINE DRUG SCREEN Drug Class                     Cutoff (ng/mL) Amphetamine and metabolites    1000 Barbiturate and metabolites    200 Benzodiazepine                 338 Tricyclics and metabolites     300 Opiates and metabolites        300 Cocaine and metabolites        300 THC                            50 Performed at [facility], [place] [address], [place], [place] [address]    WBC 11/28/2021 14.7 (H)  4.0 - 10.5 K/uL Final   RBC 11/28/2021 4.35  3.87 - 5.11 MIL/uL Final   Hemoglobin 11/28/2021 11.0 (L)  12.0 - 15.0 g/dL Final   HCT 11/28/2021 34.9 (L)  36.0 - 46.0 % Final   MCV 11/28/2021 80.2  80.0 - 100.0 fL Final   MCH 11/28/2021 25.3 (L)  26.0 - 34.0 pg Final   MCHC 11/28/2021 31.5  30.0 - 36.0 g/dL Final   RDW 11/28/2021 15.8 (H)  11.5 - 15.5 % Final   Platelets 11/28/2021 268  150 - 400 K/uL Final   REPEATED TO VERIFY   nRBC 11/28/2021 0.0  0.0 - 0.2 % Final   Neutrophils Relative % 11/28/2021 65  % Final   Neutro Abs 11/28/2021 9.6 (H)  1.7 - 7.7 K/uL Final   Lymphocytes Relative 11/28/2021 26  % Final   Lymphs Abs 11/28/2021 3.8  0.7 - 4.0 K/uL Final   Monocytes Relative 11/28/2021 5  % Final   Monocytes Absolute 11/28/2021 0.8  0.1 - 1.0 K/uL Final   Eosinophils Relative 11/28/2021 3  % Final   Eosinophils Absolute 11/28/2021 0.4  0.0 - 0.5 K/uL Final   Basophils Relative 11/28/2021 1  % Final   Basophils Absolute 11/28/2021 0.1  0.0 - 0.1 K/uL Final   Immature Granulocytes 11/28/2021 0  % Final   Abs Immature Granulocytes 11/28/2021 0.06  0.00 - 0.07 K/uL Final   Performed at [facility], [place] [address] [place], [place] [address]   I-stat hCG, quantitative 11/28/2021 <5.0  <5 mIU/mL Final   Comment 3 11/28/2021          Final   Comment:   GEST. AGE      CONC.  (mIU/mL)   <=1 WEEK        5 - 50     2 WEEKS       50 - 500     3 WEEKS       100 - 10,000     4 WEEKS     1,000 - 30,000        FEMALE AND NON-PREGNANT  FEMALE:     LESS THAN 5 mIU/mL    Sodium 11/28/2021 139  135 - 145 mmol/L Final   Potassium 11/28/2021 3.7  3.5 - 5.1 mmol/L Final   Chloride 11/28/2021 107  98 - 111 mmol/L Final   CO2 11/28/2021 27  22 - 32 mmol/L Final   Glucose, Bld 11/28/2021 181 (  H)  70 - 99 mg/dL Final   Glucose reference range applies only to samples taken after fasting for at least 8 hours.   BUN 11/28/2021 6  6 - 20 mg/dL Final   Creatinine, Ser 11/28/2021 0.90  0.44 - 1.00 mg/dL Final   Calcium 11/28/2021 9.0  8.9 - 10.3 mg/dL Final   Total Protein 11/28/2021 6.8  6.5 - 8.1 g/dL Final   Albumin 11/28/2021 3.6  3.5 - 5.0 g/dL Final   AST 11/28/2021 19  15 - 41 U/L Final   ALT 11/28/2021 18  0 - 44 U/L Final   Alkaline Phosphatase 11/28/2021 50  38 - 126 U/L Final   Total Bilirubin 11/28/2021 0.4  0.3 - 1.2 mg/dL Final   GFR, Estimated 11/28/2021 >60  >60 mL/min Final   Comment: (NOTE) Calculated using the CKD-EPI Creatinine Equation (2021)    Anion gap 11/28/2021 5  5 - 15 Final   Performed at [place] [address], [place], [place] [address]   Glucose-Capillary 11/28/2021 221 (H)  70 - 99 mg/dL Final   Glucose reference range applies only to samples taken after fasting for at least 8 hours.   Glucose-Capillary 11/29/2021 209 (H)  70 - 99 mg/dL Final   Glucose reference range applies only to samples taken after fasting for at least 8 hours.   Glucose-Capillary 11/29/2021 155 (H)  70 - 99 mg/dL Final   Glucose reference range applies only to samples taken after fasting for at least 8 hours.   Glucose-Capillary 11/29/2021 178 (H)  70 - 99 mg/dL Final   Glucose reference range applies only to samples taken after fasting for at least 8 hours.   Glucose-Capillary 11/29/2021 197 (H)  70 - 99 mg/dL Final   Glucose reference range applies only to samples taken after fasting for at least 8 hours.   Comment 1 11/29/2021 Notify RN   Final   Comment 2 11/29/2021 Document in Chart   Final    Glucose-Capillary 11/30/2021 196 (H)  70 - 99 mg/dL Final   Glucose reference range applies only to samples taken after fasting for at least 8 hours.   Comment 1 11/30/2021 Notify RN   Final   Comment 2 11/30/2021 Document in Chart   Final   Glucose-Capillary 11/30/2021 176 (H)  70 - 99 mg/dL Final   Glucose reference range applies only to samples taken after fasting for at least 8 hours.  Admission on 10/25/2021, Discharged on 10/27/2021  Component Date Value Ref Range Status   SARS Coronavirus 2 by RT PCR 10/25/2021 NEGATIVE  NEGATIVE Final   Comment: (NOTE) SARS-CoV-2 target nucleic acids are NOT DETECTED.  The SARS-CoV-2 RNA is generally detectable in upper respiratory specimens during the acute phase of infection. The lowest concentration of SARS-CoV-2 viral copies this assay can detect is 138 copies/mL. A negative result does not preclude SARS-Cov-2 infection and should not be used as the sole basis for treatment or other patient management decisions. A negative result may occur with  improper specimen collection/handling, submission of specimen other than nasopharyngeal swab, presence of viral mutation(s) within the areas targeted by this assay, and inadequate number of viral copies(<138 copies/mL). A negative result must be combined with clinical observations, patient history, and epidemiological information. The expected result is Negative.  Fact Sheet for Patients:  EntrepreneurPulse.com.au  Fact Sheet for Healthcare Providers:  IncredibleEmployment.be  This test is no  t yet approved or cleared by the [place] and  has been authorized for detection and/or diagnosis of SARS-CoV-2 by FDA under an Emergency Use Authorization (EUA). This EUA will remain  in effect (meaning this test can be used) for the duration of the COVID-19 declaration under Section 564(b)(1) of the Act, 21 U.S.C.section  360bbb-3(b)(1), unless the authorization is terminated  or revoked sooner.       Influenza A by PCR 10/25/2021 NEGATIVE  NEGATIVE Final   Influenza B by PCR 10/25/2021 NEGATIVE  NEGATIVE Final   Comment: (NOTE) The Xpert Xpress SARS-CoV-2/FLU/RSV plus assay is intended as an aid in the diagnosis of influenza from Nasopharyngeal swab specimens and should not be used as a sole basis for treatment. Nasal washings and aspirates are unacceptable for Xpert Xpress SARS-CoV-2/FLU/RSV testing.  Fact Sheet for Patients: EntrepreneurPulse.com.au  Fact Sheet for Healthcare Providers: IncredibleEmployment.be  This test is not yet approved or cleared by the [place] FDA and has been authorized for detection and/or diagnosis of SARS-CoV-2 by FDA under an Emergency Use Authorization (EUA). This EUA will remain in effect (meaning this test can be used) for the duration of the COVID-19 declaration under Section 564(b)(1) of the Act, 21 U.S.C. section 360bbb-3(b)(1), unless the authorization is terminated or revoked.  Performed at [facility], [place] [address] [place], [place] [address]    WBC 10/25/2021 14.9 (H)  4.0 - 10.5 K/uL Final   RBC 10/25/2021 4.04  3.87 - 5.11 MIL/uL Final   Hemoglobin 10/25/2021 10.0 (L)  12.0 - 15.0 g/dL Final   HCT 10/25/2021 32.6 (L)  36.0 - 46.0 % Final   MCV 10/25/2021 80.7  80.0 - 100.0 fL Final   MCH 10/25/2021 24.8 (L)  26.0 - 34.0 pg Final   MCHC 10/25/2021 30.7  30.0 - 36.0 g/dL Final   RDW 10/25/2021 15.4  11.5 - 15.5 % Final   Platelets 10/25/2021 213  150 - 400 K/uL Final   REPEATED TO VERIFY   nRBC 10/25/2021 0.0  0.0 - 0.2 % Final   Neutrophils Relative % 10/25/2021 71  % Final   Neutro Abs 10/25/2021 10.5 (H)  1.7 - 7.7 K/uL Final   Lymphocytes Relative 10/25/2021 20  % Final   Lymphs Abs 10/25/2021 3.0  0.7 - 4.0 K/uL Final   Monocytes Relative 10/25/2021 6  % Final   Monocytes Absolute 10/25/2021  0.8  0.1 - 1.0 K/uL Final   Eosinophils Relative 10/25/2021 3  % Final   Eosinophils Absolute 10/25/2021 0.5  0.0 - 0.5 K/uL Final   Basophils Relative 10/25/2021 0  % Final   Basophils Absolute 10/25/2021 0.1  0.0 - 0.1 K/uL Final   Immature Granulocytes 10/25/2021 0  % Final   Abs Immature Granulocytes 10/25/2021 0.05  0.00 - 0.07 K/uL Final   Performed at [facility], [place] [address], [place], [place] [address]   Sodium 10/25/2021 141  135 - 145 mmol/L Final   Potassium 10/25/2021 4.3  3.5 - 5.1 mmol/L Final   Chloride 10/25/2021 109  98 - 111 mmol/L Final   CO2 10/25/2021 22  22 - 32 mmol/L Final   Glucose, Bld 10/25/2021 197 (H)  70 - 99 mg/dL Final   Glucose reference range applies only to samples taken after fasting for at least 8 hours.   BUN 10/25/2021 5 (L)  6 - 20 mg/dL Final   Creatinine, Ser 10/25/2021 0.88  0.44 - 1.00 mg/dL Final  Calcium 10/25/2021 9.2  8.9 - 10.3 mg/dL Final   Total Protein 10/25/2021 6.4 (L)  6.5 - 8.1 g/dL Final   Albumin 10/25/2021 3.6  3.5 - 5.0 g/dL Final   AST 10/25/2021 18  15 - 41 U/L Final   ALT 10/25/2021 18  0 - 44 U/L Final   Alkaline Phosphatase 10/25/2021 52  38 - 126 U/L Final   Total Bilirubin 10/25/2021 0.6  0.3 - 1.2 mg/dL Final   GFR, Estimated 10/25/2021 >60  >60 mL/min Final   Comment: (NOTE) Calculated using the CKD-EPI Creatinine Equation (2021)    Anion gap 10/25/2021 10  5 - 15 Final   Performed at [facility], [place] [address], [place], [place] [address]   Alcohol, Ethyl (B) 10/25/2021 <10  <10 mg/dL Final   Comment: (NOTE) Lowest detectable limit for serum alcohol is 10 mg/dL.  For medical purposes only. Performed at [facility], [place] [address] [place], [place] [address]    POC Amphetamine UR 10/25/2021 None Detected  NONE DETECTED (Cut Off Level 1000 ng/mL) Preliminary   POC Secobarbital (BAR) 10/25/2021 None Detected  NONE DETECTED (Cut Off Level 300 ng/mL) Preliminary   POC Buprenorphine (BUP)  10/25/2021 None Detected  NONE DETECTED (Cut Off Level 10 ng/mL) Preliminary   POC Oxazepam (BZO) 10/25/2021 None Detected  NONE DETECTED (Cut Off Level 300 ng/mL) Preliminary   POC Cocaine UR 10/25/2021 None Detected  NONE DETECTED (Cut Off Level 300 ng/mL) Preliminary   POC Methamphetamine UR 10/25/2021 None Detected  NONE DETECTED (Cut Off Level 1000 ng/mL) Preliminary   POC Morphine 10/25/2021 None Detected  NONE DETECTED (Cut Off Level 300 ng/mL) Preliminary   POC Methadone UR 10/25/2021 None Detected  NONE DETECTED (Cut Off Level 300 ng/mL) Preliminary   POC Oxycodone UR 10/25/2021 None Detected  NONE DETECTED (Cut Off Level 100 ng/mL) Preliminary   POC Marijuana UR 10/25/2021 None Detected  NONE DETECTED (Cut Off Level 50 ng/mL) Preliminary   Lithium Lvl 10/25/2021 0.54 (L)  0.60 - 1.20 mmol/L Final   Performed at [facility], [place] [address], [place],  [address]   SARS Coronavirus 2 Ag 10/25/2021 Negative  Negative Final   Glucose-Capillary 10/25/2021 184 (H)  70 - 99 mg/dL Final   Glucose reference range applies only to samples taken after fasting for at least 8 hours.   Preg Test, Ur 10/25/2021 NEGATIVE  NEGATIVE Final   Comment:        THE SENSITIVITY OF THIS METHODOLOGY IS >24 mIU/mL    Glucose-Capillary 10/26/2021 136 (H)  70 - 99 mg/dL Final   Glucose reference range applies only to samples taken after fasting for at least 8 hours.   Glucose-Capillary 10/26/2021 154 (H)  70 - 99 mg/dL Final   Glucose reference range applies only to samples taken after fasting for at least 8 hours.   Glucose-Capillary 10/27/2021 144 (H)  70 - 99 mg/dL Final   Glucose reference range applies only to samples taken after fasting for at least 8 hours.   Glucose-Capillary 10/27/2021 158 (H)  70 - 99 mg/dL Final   Glucose reference range applies only to samples taken after fasting for at least 8 hours.  Admission on 10/22/2021, Discharged on 10/22/2021  Component Date Value Ref Range  Status   WBC 10/22/2021 12.7 (H)  4.0 - 10.5 K/uL Final   RBC 10/22/2021 4.71  3.87 - 5.11 MIL/uL Final   Hemoglobin 10/22/2021 11.9 (L)  12.0 -  15.0 g/dL Final   HCT 10/22/2021 38.2  36.0 - 46.0 % Final   MCV 10/22/2021 81.1  80.0 - 100.0 fL Final   MCH 10/22/2021 25.3 (L)  26.0 - 34.0 pg Final   MCHC 10/22/2021 31.2  30.0 - 36.0 g/dL Final   RDW 10/22/2021 15.4  11.5 - 15.5 % Final   Platelets 10/22/2021 279  150 - 400 K/uL Final   nRBC 10/22/2021 0.0  0.0 - 0.2 % Final   Neutrophils Relative % 10/22/2021 59  % Final   Neutro Abs 10/22/2021 7.7  1.7 - 7.7 K/uL Final   Lymphocytes Relative 10/22/2021 28  % Final   Lymphs Abs 10/22/2021 3.5  0.7 - 4.0 K/uL Final   Monocytes Relative 10/22/2021 6  % Final   Monocytes Absolute 10/22/2021 0.7  0.1 - 1.0 K/uL Final   Eosinophils Relative 10/22/2021 6  % Final   Eosinophils Absolute 10/22/2021 0.7 (H)  0.0 - 0.5 K/uL Final   Basophils Relative 10/22/2021 1  % Final   Basophils Absolute 10/22/2021 0.1  0.0 - 0.1 K/uL Final   Immature Granulocytes 10/22/2021 0  % Final   Abs Immature Granulocytes 10/22/2021 0.03  0.00 - 0.07 K/uL Final   Performed at [facility], [place] [address] [place], [place] [address]  Admission on 10/20/2021, Discharged on 10/20/2021  Component Date Value Ref Range Status   WBC 10/20/2021 10.7 (H)  4.0 - 10.5 K/uL Final   RBC 10/20/2021 4.44  3.87 - 5.11 MIL/uL Final   Hemoglobin 10/20/2021 11.2 (L)  12.0 - 15.0 g/dL Final   HCT 10/20/2021 36.0  36.0 - 46.0 % Final   MCV 10/20/2021 81.1  80.0 - 100.0 fL Final   MCH 10/20/2021 25.2 (L)  26.0 - 34.0 pg Final   MCHC 10/20/2021 31.1  30.0 - 36.0 g/dL Final   RDW 10/20/2021 15.3  11.5 - 15.5 % Final   Platelets 10/20/2021 260  150 - 400 K/uL Final   nRBC 10/20/2021 0.0  0.0 - 0.2 % Final   Neutrophils Relative % 10/20/2021 65  % Final   Neutro Abs 10/20/2021 6.9  1.7 - 7.7 K/uL Final   Lymphocytes Relative 10/20/2021 24  % Final   Lymphs Abs  10/20/2021 2.6  0.7 - 4.0 K/uL Final   Monocytes Relative 10/20/2021 5  % Final   Monocytes Absolute 10/20/2021 0.5  0.1 - 1.0 K/uL Final   Eosinophils Relative 10/20/2021 5  % Final   Eosinophils Absolute 10/20/2021 0.6 (H)  0.0 - 0.5 K/uL Final   Basophils Relative 10/20/2021 1  % Final   Basophils Absolute 10/20/2021 0.1  0.0 - 0.1 K/uL Final   Immature Granulocytes 10/20/2021 0  % Final   Abs Immature Granulocytes 10/20/2021 0.04  0.00 - 0.07 K/uL Final   Performed at [facility], [place] [address], [place], [place] [address]   I-stat hCG, quantitative 10/20/2021 <5.0  <5 mIU/mL Final   Comment 3 10/20/2021          Final   Comment:   GEST. AGE      CONC.  (mIU/mL)   <=1 WEEK        5 - 50     2 WEEKS       50 - 500     3 WEEKS       100 - 10,000     4 WEEKS     1,000 - 30,000  FEMALE AND NON-PREGNANT FEMALE:     LESS THAN 5 mIU/mL    Sodium 10/20/2021 141  135 - 145 mmol/L Final   Potassium 10/20/2021 3.7  3.5 - 5.1 mmol/L Final   Chloride 10/20/2021 110  98 - 111 mmol/L Final   CO2 10/20/2021 28  22 - 32 mmol/L Final   Glucose, Bld 10/20/2021 139 (H)  70 - 99 mg/dL Final   Glucose reference range applies only to samples taken after fasting for at least 8 hours.   BUN 10/20/2021 10  6 - 20 mg/dL Final   Creatinine, Ser 10/20/2021 0.82  0.44 - 1.00 mg/dL Final   Calcium 10/20/2021 9.1  8.9 - 10.3 mg/dL Final   Total Protein 10/20/2021 7.5  6.5 - 8.1 g/dL Final   Albumin 10/20/2021 4.1  3.5 - 5.0 g/dL Final   AST 10/20/2021 22  15 - 41 U/L Final   ALT 10/20/2021 24  0 - 44 U/L Final   Alkaline Phosphatase 10/20/2021 48  38 - 126 U/L Final   Total Bilirubin 10/20/2021 0.5  0.3 - 1.2 mg/dL Final   GFR, Estimated 10/20/2021 >60  >60 mL/min Final   Comment: (NOTE) Calculated using the CKD-EPI Creatinine Equation (2021)    Anion gap 10/20/2021 3 (L)  5 - 15 Final   Performed at [facility], [place] [address] [place], [place] [address]   Admission on 10/13/2021, Discharged on 10/14/2021  Component Date Value Ref Range Status   Sodium 10/13/2021 141  135 - 145 mmol/L Final   Potassium 10/13/2021 3.8  3.5 - 5.1 mmol/L Final   Chloride 10/13/2021 110  98 - 111 mmol/L Final   CO2 10/13/2021 27  22 - 32 mmol/L Final   Glucose, Bld 10/13/2021 95  70 - 99 mg/dL Final   Glucose reference range applies only to samples taken after fasting for at least 8 hours.   BUN 10/13/2021 12  6 - 20 mg/dL Final   Creatinine, Ser 10/13/2021 0.79  0.44 - 1.00 mg/dL Final   Calcium 10/13/2021 9.2  8.9 - 10.3 mg/dL Final   Total Protein 10/13/2021 7.9  6.5 - 8.1 g/dL Final   Albumin 10/13/2021 4.2  3.5 - 5.0 g/dL Final   AST 10/13/2021 24  15 - 41 U/L Final   ALT 10/13/2021 24  0 - 44 U/L Final   Alkaline Phosphatase 10/13/2021 52  38 - 126 U/L Final   Total Bilirubin 10/13/2021 0.5  0.3 - 1.2 mg/dL Final   GFR, Estimated 10/13/2021 >60  >60 mL/min Final   Comment: (NOTE) Calculated using the CKD-EPI Creatinine Equation (2021)    Anion gap 10/13/2021 4 (L)  5 - 15 Final   Performed at [facility], [place] [address], [place], [place] [address]   Alcohol, Ethyl (B) 10/13/2021 <10  <10 mg/dL Final   Comment: (NOTE) Lowest detectable limit for serum alcohol is 10 mg/dL.  For medical purposes only. Performed at [facility], [place] [address], [place], [place] [address]    Salicylate Lvl 22/05/5425 <7.0 (L)  7.0 - 30.0 mg/dL Final   Performed at [place] [address], [place], [place] [address]   Acetaminophen (Tylenol), Serum 10/13/2021 <10 (L)  10 - 30 ug/mL Final   Comment: (NOTE) Therapeutic concentrations vary significantly. A range of 10-30 ug/mL  may be an effective concentration for many patients. However, some  are best treated at concentrations outside of this range. Acetaminophen concentrations >  150 ug/mL at 4 hours after ingestion  and >50 ug/mL at 12 hours after  ingestion are often associated with  toxic reactions.  Performed at [facility], [place] [address], [place], [place] [address]    WBC 10/13/2021 14.6 (H)  4.0 - 10.5 K/uL Final   RBC 10/13/2021 4.27  3.87 - 5.11 MIL/uL Final   Hemoglobin 10/13/2021 10.9 (L)  12.0 - 15.0 g/dL Final   HCT 10/13/2021 34.8 (L)  36.0 - 46.0 % Final   MCV 10/13/2021 81.5  80.0 - 100.0 fL Final   [facility] 10/13/2021 25.5 (L)  26.0 - 34.0 pg Final   MCHC 10/13/2021 31.3  30.0 - 36.0 g/dL Final   RDW 10/13/2021 15.0  11.5 - 15.5 % Final   Platelets 10/13/2021 304  150 - 400 K/uL Final   nRBC 10/13/2021 0.0  0.0 - 0.2 % Final   Performed at [facility], [place] [address], [place], [place] [address]   Opiates 10/14/2021 NONE DETECTED  NONE DETECTED Final   Cocaine 10/14/2021 NONE DETECTED  NONE DETECTED Final   Benzodiazepines 10/14/2021 NONE DETECTED  NONE DETECTED Final   Amphetamines 10/14/2021 NONE DETECTED  NONE DETECTED Final   Tetrahydrocannabinol 10/14/2021 NONE DETECTED  NONE DETECTED Final   Barbiturates 10/14/2021 POSITIVE (A)  NONE DETECTED Final   Comment: (NOTE) DRUG SCREEN FOR MEDICAL PURPOSES ONLY.  IF CONFIRMATION IS NEEDED FOR ANY PURPOSE, NOTIFY LAB WITHIN 5 DAYS.  LOWEST DETECTABLE LIMITS FOR URINE DRUG SCREEN Drug Class                     Cutoff (ng/mL) Amphetamine and metabolites    1000 Barbiturate and metabolites    200 Benzodiazepine                 086 Tricyclics and metabolites     300 Opiates and metabolites        300 Cocaine and metabolites        300 THC                            50 Performed at [facility], [place] [address] [place], [place] [address]    I-stat hCG, quantitative 10/13/2021 <5.0  <5 mIU/mL Final   Comment 3 10/13/2021          Final   Comment:   GEST. AGE      CONC.  (mIU/mL)   <=1 WEEK        5 - 50     2 WEEKS       50 - 500     3 WEEKS       100 - 10,000     4 WEEKS     1,000 - 30,000        FEMALE  AND NON-PREGNANT FEMALE:     LESS THAN 5 mIU/mL    Lithium Lvl 10/13/2021 0.73  0.60 - 1.20 mmol/L Final   Performed at [facility], [place] [address], [place], [place] [address]   Valproic Acid Lvl 10/13/2021 <20 (L)  50.0 - 100.0 ug/mL Final   Comment: RESULTS CONFIRMED BY MANUAL DILUTION LIPEMIC SPECIMEN, RESULTS MAY BE AFFECTED. Performed at [facility], [place] [address] [place], [place] [address]    SARS Coronavirus 2 by RT PCR 10/13/2021 NEGATIVE  NEGATIVE Final   Comment: (NOTE) SARS-CoV-2 target nucleic acids are NOT DETECTED.  The SARS-CoV-2 RNA is generally detectable in upper respiratory specimens during the acute phase of infection. The lowest concentration of SARS-CoV-2 viral copies this assay can detect is 138 copies/mL. A negative result does not preclude SARS-Cov-2 infection and should not be used as the sole basis for treatment or other patient management decisions. A negative result may occur with  improper specimen collection/handling, submission of specimen other than nasopharyngeal swab, presence of viral mutation(s) within the areas targeted by this assay, and inadequate number of viral copies(<138 copies/mL). A negative result must be combined with clinical observations, patient history, and epidemiological information. The expected result is Negative.  Fact Sheet for Patients:  EntrepreneurPulse.com.au  Fact Sheet for Healthcare Providers:  IncredibleEmployment.be  This test is no                          t yet approved or cleared by the [place] FDA and  has been authorized for detection and/or diagnosis of SARS-CoV-2 by FDA under an Emergency Use Authorization (EUA). This EUA will remain  in effect (meaning this test can be used) for the duration of the COVID-19 declaration under Section 564(b)(1) of the Act, 21 U.S.C.section 360bbb-3(b)(1), unless the authorization is  terminated  or revoked sooner.       Influenza A by PCR 10/13/2021 NEGATIVE  NEGATIVE Final   Influenza B by PCR 10/13/2021 NEGATIVE  NEGATIVE Final   Comment: (NOTE) The Xpert Xpress SARS-CoV-2/FLU/RSV plus assay is intended as an aid in the diagnosis of influenza from Nasopharyngeal swab specimens and should not be used as a sole basis for treatment. Nasal washings and aspirates are unacceptable for Xpert Xpress SARS-CoV-2/FLU/RSV testing.  Fact Sheet for Patients: EntrepreneurPulse.com.au  Fact Sheet for Healthcare Providers: IncredibleEmployment.be  This test is not yet approved or cleared by the [place] FDA and has been authorized for detection and/or diagnosis of SARS-CoV-2 by FDA under an Emergency Use Authorization (EUA). This EUA will remain in effect (meaning this test can be used) for the duration of the COVID-19 declaration under Section 564(b)(1) of the Act, 21 U.S.C. section 360bbb-3(b)(1), unless the authorization is terminated or revoked.  Performed at [facility], [place] [address], [place], [place] [address]    Glucose-Capillary 10/14/2021 126 (H)  70 - 99 mg/dL Final   Glucose reference range applies only to samples taken after fasting for at least 8 hours.  There may be more visits with results that are not included.    Allergies: Wellbutrin [bupropion], Omnipaque [iohexol], Penicillins, Atarax [hydroxyzine], Contrast media [iodinated contrast media], and Cymbalta [duloxetine hcl]  PTA Medications: (Not in a hospital admission)   Medical Decision Making  Overnight observation patient to be reassessed by psychiatry -Home medications restarted -[facility] for [facility] planning    Recommendations  Based on my evaluation the patient does not appear to have an emergency medical condition.  Derrill Center, NP 01/04/22  5:53 PM

## 2022-01-04 NOTE — ED Notes (Signed)
Pt sleeping at present, no distress noted.  Monitoring for safety. 

## 2022-01-04 NOTE — ED Notes (Signed)
The pt arrived to this facility for an assessment, before being taken to an assessment room she was observed by this writer attempting to take multiple pills in the front lobby. The pt had the pills in her mouth and stuck out her tongue for this writer to see. This writer instructed the pt to spit out the pills, pt followed instructions and spit out the pills. Pt was instructed to remove any other loose medication in her bag to be disposed of in the trash. This writer disposed of the other medication that was removed from the pts bag. The pt assured this writer that she did not swallow any of the pills. This Probation officer took the pts vital signs and notified the NP on shift.
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
## 2022-01-05 DIAGNOSIS — R45851 Suicidal ideations: Secondary | ICD-10-CM | POA: Diagnosis not present

## 2022-01-05 DIAGNOSIS — F319 Bipolar disorder, unspecified: Secondary | ICD-10-CM | POA: Diagnosis not present

## 2022-01-05 DIAGNOSIS — Z20822 Contact with and (suspected) exposure to covid-19: Secondary | ICD-10-CM | POA: Diagnosis not present

## 2022-01-05 DIAGNOSIS — F250 Schizoaffective disorder, bipolar type: Secondary | ICD-10-CM | POA: Diagnosis not present

## 2022-01-05 LAB — RESP PANEL BY RT-PCR (FLU A&B, COVID) ARPGX2
Influenza A by PCR: NEGATIVE
Influenza B by PCR: NEGATIVE
SARS Coronavirus 2 by RT PCR: NEGATIVE

## 2022-01-05 NOTE — Progress Notes (Signed)
[name] received her discharge order, she received her AVS, questions answered and she retrieved her personal belongings. A taxi was called for her and she received a voucher.

## 2022-01-05 NOTE — Discharge Instructions (Addendum)
Discharge recommendations:  Patient is to take medications as prescribed. No medications changes during your stay at the [facility].   Please follow up with Dr. Sheppard Evens at [facility] to discuss medication management. Your next follow up appointment with the [facility] is tomorrow on 01/06/22 after 10:00 am with Gardiner Ramus.  Please follow up with your primary care provider for all medical related needs.   Therapy: We recommend that patient participate in individual therapy to address mental health concerns.  Medications: The patient is to contact a medical professional and/or outpatient provider to address any new side effects that develop. The patient should update outpatient providers of any new medications and/or medication changes.   Atypical antipsychotics: If you are prescribed an atypical antipsychotic, it is recommended that your height, weight, BMI, blood pressure, fasting lipid panel, and fasting blood sugar be monitored by your outpatient providers.  Safety:  The patient should abstain from use of illicit substances/drugs and abuse of any medications. If symptoms worsen or do not continue to improve or if the patient becomes actively suicidal or homicidal then it is recommended that the patient return to the closest hospital emergency department, the [facility], or call 911 for further evaluation and treatment. National Suicide Prevention Lifeline 1-800-SUICIDE or (973) 765-0411.  About 988 988 offers 24/7 access to trained crisis counselors who can help people experiencing mental health-related distress. People can call or text 988 or chat 988lifeline.org for themselves or if they are worried about a loved one who may need crisis support.

## 2022-01-05 NOTE — Progress Notes (Signed)
Received [name] this AM asleep in her chair bed, she was complaint with her medications. Later she asked to talk with the provider. A chat was sent related to her request.

## 2022-01-05 NOTE — ED Notes (Signed)
Pt got up and ate a snack and went back to sleep. No c/o of pain or distress. Will continue to monitor for safety

## 2022-01-05 NOTE — ED Provider Notes (Signed)
FBC/OBS ASAP Discharge Summary  Date and Time: 01/05/2022 1:55 PM  Name: [name]  MRN:  [id number]   Discharge Diagnoses:  Final diagnoses:  Bipolar I disorder, most recent episode depressed ([place])  Borderline personality disorder ([place])    Subjective: Patient seen and re-evaluated face to face by this provider and chart reviewed. Today, patient denies suicidal ideations. She states that she is ready to go home. She verbally contracts for safety and states that she does not want to die. She states, "I love myself. That's the devil talking." She states that she was upset yesterday because her boyfriend keeps getting locked up and her mother won't want let her in her house to see the puppy. She states that she is ready to go back to her new apartment that she moved into 4 days ago. She states that she has a lot of sea food at home but doesn't have a T.V. She states that if she had a T.V. she would stay home more.   She denies HI. She denies AVH. There is no objective evidence that the patient is currently responding to internal or external stimuli. She reports a good mood today. She reports fair sleep. She reports a fair appetite but states that there's not enough food here and she ate an old salad this morning.  She participated in the creation of a safety plan. She identifies current stressors as not being on the right medications, boyfriend locked up, and not being able to go to her mom's house. She identifies healthy coping skills as taking a walk in her new neighborhood, and listening to music. She identifies having support with [facility] and states that she will talk to Dr. Sheppard Evens about changing her medications. She states that her medications are bubbled wrap and delivered by the pharmacy monthly. She denies access to weapons. She agrees to call 911 for help if the suicidal ideations recur.   Stay Summary: [name] 31 year old African-American female well-known to this  service presents with suicidal ideations with a plan to overdose on pills. She was recently discharged from inpatient admission at [facility] however she states " I need something done for my medications daily did not change anything and I cannot go on living like this". [name] reports "if you discharge me then I will end up in the ICU I promise you that."  Patient was admitted to the [facility] continuous assessment unit on 01/04/22 for overnight observation. She was restarted on home medications haldol 10 mg po QHS, amaryl 4 mg po daily, cogentin 0.5 mg po BID, Buspar 15 mg po TID, and Norvasc 5 mg po daily. She has been compliant with taking scheduled medications without any side effects. She has been observed on the unit with out any disruptive, aggressive, self harm or psychotic behaviors. I called the [facility] today, 01/05/22 and spoke to [name] who states that the patient was scheduled for a follow up appointment today but was not home. She states that the patient's next follow up appointment is scheduled for tomorrow, 01/06/22 after 10 am with Stacy Ramus, RN.   Total Time spent with patient: 30 minutes  Past Psychiatric History: Schizoaffective disorder-bipolar type   Past Medical History:  Past Medical History:  Diagnosis Date   Anxiety    Bipolar 1 disorder ([place])    Cognitive deficits    Depression    Diabetes mellitus without complication ([place])    Hypertension    Mental disorder  Mental health disorder    Obesity     Past Surgical History:  Procedure Laterality Date   CESAREAN SECTION     CESAREAN SECTION N/A 04/25/2013   Procedure: REPEAT CESAREAN SECTION;  Surgeon: [name], MD;  Location: [facility];  Service: Obstetrics;  Laterality: N/A;   MASS EXCISION N/A 06/03/2012   Procedure: EXCISION MASS;  Surgeon: [name], MD;  Location: [place];  Service: ENT;  Laterality: N/A;  Excision uvula mass   TONSILLECTOMY N/A 06/03/2012   Procedure:  TONSILLECTOMY;  Surgeon: [name], MD;  Location: [place];  Service: ENT;  Laterality: N/A;   TONSILLECTOMY     Family History:  Family History  Problem Relation Age of Onset   Hypertension Mother    Diabetes Father    Family Psychiatric History: No history reported.  Social History:  Social History   Substance and Sexual Activity  Alcohol Use Not Currently     Social History   Substance and Sexual Activity  Drug Use Not Currently   Types: "Crack" cocaine, Other-see comments   Comment: Patient reports hx of smoking Crack    Social History   Socioeconomic History   Marital status: Single    Spouse name: Not on file   Number of children: Not on file   Years of education: Not on file   Highest education level: Not on file  Occupational History   Not on file  Tobacco Use   Smoking status: Every Day    Types: Cigars   Smokeless tobacco: Never   Tobacco comments:    Pt declined  Vaping Use   Vaping Use: Never used  Substance and Sexual Activity   Alcohol use: Not Currently   Drug use: Not Currently    Types: "Crack" cocaine, Other-see comments    Comment: Patient reports hx of smoking Crack   Sexual activity: Not Currently    Birth control/protection: None    Comment: occasionally  Other Topics Concern   Not on file  Social History Narrative   ** Merged History Encounter **       Social Determinants of Health   Financial Resource Strain: Not on file  Food Insecurity: Not on file  Transportation Needs: Not on file  Physical Activity: Not on file  Stress: Not on file  Social Connections: Not on file   SDOH:  SDOH Screenings   Alcohol Screen: Low Risk  (05/02/2020)  Depression (PHQ2-9): Medium Risk (10/24/2021)  Tobacco Use: High Risk (01/02/2022)    Tobacco Cessation:  N/A, patient does not currently use tobacco products  Current Medications:  Current Facility-Administered Medications  Medication Dose Route Frequency Provider  Last Rate Last Admin   acetaminophen (TYLENOL) tablet 650 mg  650 mg Oral Q6H PRN Derrill Center, NP       alum & mag hydroxide-simeth (MAALOX/MYLANTA) 200-200-20 MG/5ML suspension 30 mL  30 mL Oral Q4H PRN Derrill Center, NP       amLODipine (NORVASC) tablet 5 mg  5 mg Oral Daily Derrill Center, NP   5 mg at 01/05/22 0930   atorvastatin (LIPITOR) tablet 20 mg  20 mg Oral QHS Derrill Center, NP   20 mg at 01/04/22 2103   benztropine (COGENTIN) tablet 0.5 mg  0.5 mg Oral BID Derrill Center, NP   0.5 mg at 01/05/22 0930   busPIRone (BUSPAR) tablet 15 mg  15 mg Oral TID Derrill Center, NP  glimepiride (AMARYL) tablet 4 mg  4 mg Oral Q breakfast Derrill Center, NP   4 mg at 01/05/22 0931   haloperidol (HALDOL) tablet 10 mg  10 mg Oral QHS Derrill Center, NP   10 mg at 01/04/22 2103   magnesium hydroxide (MILK OF MAGNESIA) suspension 30 mL  30 mL Oral Daily PRN Derrill Center, NP       traZODone (DESYREL) tablet 50 mg  50 mg Oral QHS PRN Derrill Center, NP       Current Outpatient Medications  Medication Sig Dispense Refill   amLODipine (NORVASC) 5 MG tablet Take 5 mg by mouth daily.     benztropine (COGENTIN) 0.5 MG tablet Take 0.5 mg by mouth 2 (two) times daily.     busPIRone (BUSPAR) 15 MG tablet Take 15 mg by mouth 3 (three) times daily.     cariprazine (VRAYLAR) 3 MG capsule Take 3 mg by mouth daily.     Cholecalciferol (VITAMIN D-3 PO) Take 1 tablet by mouth daily.     haloperidol (HALDOL) 10 MG tablet Take 10 mg by mouth at bedtime. Take with '5mg'$  tablet for '15mg'$  dose.     haloperidol (HALDOL) 5 MG tablet Take 5 mg by mouth at bedtime. Take with '10mg'$  tablet for '15mg'$  dose.     haloperidol decanoate (HALDOL DECANOATE) 100 MG/ML injection Inject 150 mg into the muscle every 30 (thirty) days.     insulin detemir (LEVEMIR) 100 UNIT/ML injection Inject 0.4 mLs (40 Units total) into the skin 2 (two) times daily. 10 mL 11   lithium carbonate (ESKALITH) 450 MG CR tablet Take 450 mg by  mouth at bedtime.     metoprolol tartrate (LOPRESSOR) 25 MG tablet Take 12.5 mg by mouth 2 (two) times daily.      PTA Medications: (Not in a hospital admission)      10/24/2021   11:07 AM 07/11/2020    8:18 AM 05/26/2020    3:56 PM  Depression screen PHQ 2/9  Decreased Interest 1 0 2  Down, Depressed, Hopeless '1 1 2  '$ PHQ - 2 Score '2 1 4  '$ Altered sleeping 1 0 2  Tired, decreased energy '1 1 2  '$ Change in appetite 0 0 2  Feeling bad or failure about yourself  1 0 2  Trouble concentrating '1 2 2  '$ Moving slowly or fidgety/restless 0 0 2  Suicidal thoughts 1 0 2  PHQ-9 Score '7 4 18  '$ Difficult doing work/chores Somewhat difficult  Very difficult    Flowsheet Row ED from 01/04/2022 in [facility] ED from 01/02/2022 in [place] [facility] ED from 12/24/2021 in [place] [facility]  C-SSRS RISK CATEGORY High Risk High Risk High Risk       Musculoskeletal  Strength & Muscle Tone: within normal limits Gait & Station: normal Patient leans: N/A  Psychiatric Specialty Exam  Presentation  General Appearance: Appropriate for Environment  Eye Contact:Fair  Speech:Clear and Coherent  Speech Volume:Normal  Handedness:Left   Mood and Affect  Mood:Euthymic  Affect:Congruent   Thought Process  Thought Processes:Coherent; Goal Directed  Descriptions of Associations:Intact  Orientation:Full (Time, Place and Person)  Thought Content:Logical  Diagnosis of Schizophrenia or Schizoaffective disorder in past: Yes  Duration of Psychotic Symptoms: Greater than six months   Hallucinations:Hallucinations: None  Ideas of Reference:None  Suicidal Thoughts:Suicidal Thoughts: No  Homicidal Thoughts:Homicidal Thoughts: No   Sensorium  Memory:Immediate Fair; Recent Fair; Remote Fair  Judgment:Intact  Insight:Present   Executive Functions  Concentration:Fair  Attention  Span:Fair  [place]   Psychomotor Activity  Psychomotor Activity:Psychomotor Activity: Normal   Assets  Assets:Communication Skills; Desire for Improvement; Financial Resources/Insurance; Housing; Leisure Time; Physical Health; Social Support   Sleep  Sleep:Sleep: Fair Number of Hours of Sleep: 8   Physical Exam  Physical Exam HENT:     Head: Normocephalic.     Nose: Nose normal.  Cardiovascular:     Rate and Rhythm: Normal rate.  Musculoskeletal:        General: Normal range of motion.     Cervical back: Normal range of motion.  Neurological:     Mental Status: She is alert and oriented to person, place, and time.    Review of Systems  Constitutional: Negative.   HENT: Negative.    Eyes: Negative.   Respiratory: Negative.    Cardiovascular: Negative.   Gastrointestinal: Negative.   Genitourinary: Negative.    Blood pressure 132/85, pulse 88, temperature 98.2 F (36.8 C), temperature source Oral, resp. rate 16, last menstrual period 12/09/2021, SpO2 100 %. There is no height or weight on file to calculate BMI.  Demographic Factors:  Low socioeconomic status, Living alone, and Unemployed  Loss Factors: Financial problems/change in socioeconomic status  Historical Factors: Prior suicide attempts and Impulsivity  Risk Reduction Factors:   Sense of responsibility to family, Religious beliefs about death, and Positive social support  Continued Clinical Symptoms:  Previous Psychiatric Diagnoses and Treatments  Cognitive Features That Contribute To Risk:  None    Suicide Risk:  Minimal: No identifiable suicidal ideation.  Patients presenting with no risk factors but with morbid ruminations; may be classified as minimal risk based on the severity of the depressive symptoms  Plan Of Care/Follow-up recommendations:  Activity:  as tolerated.   Discharge recommendations:  Patient is to take medications as  prescribed. No medications changes during your stay at the [facility].   Please follow up with Dr. Sheppard Evens at [facility] to discuss medication management. Your next follow up appointment with the [facility] is tomorrow on 01/06/22 after 10:00 am with [name].  Please follow up with your primary care provider for all medical related needs.   Therapy: We recommend that patient participate in individual therapy to address mental health concerns.  Medications: The patient is to contact a medical professional and/or outpatient provider to address any new side effects that develop. The patient should update outpatient providers of any new medications and/or medication changes.   Atypical antipsychotics: If you are prescribed an atypical antipsychotic, it is recommended that your height, weight, BMI, blood pressure, fasting lipid panel, and fasting blood sugar be monitored by your outpatient providers.  Safety:  The patient should abstain from use of illicit substances/drugs and abuse of any medications. If symptoms worsen or do not continue to improve or if the patient becomes actively suicidal or homicidal then it is recommended that the patient return to the closest hospital emergency department, the [facility], or call 911 for further evaluation and treatment. National Suicide Prevention Lifeline 1-800-SUICIDE or (314)810-3547.  About 988 988 offers 24/7 access to trained crisis counselors who can help people experiencing mental health-related distress. People can call or text 988 or chat 988lifeline.org for themselves or if they are worried about a loved one who may need crisis support.    Disposition: Discharge to self.   Marissa Calamity, NP 01/05/2022,  1:55 PM

## 2022-01-06 ENCOUNTER — Emergency Department (HOSPITAL_COMMUNITY)
Admission: EM | Admit: 2022-01-06 | Discharge: 2022-01-06 | Disposition: A | Discharge status: Home / Self Care | Payer: Medicaid Other | Attending: Emergency Medicine | Admitting: Emergency Medicine

## 2022-01-06 DIAGNOSIS — D72829 Elevated white blood cell count, unspecified: Secondary | ICD-10-CM | POA: Insufficient documentation

## 2022-01-06 DIAGNOSIS — R45850 Homicidal ideations: Secondary | ICD-10-CM | POA: Insufficient documentation

## 2022-01-06 DIAGNOSIS — Z01818 Encounter for other preprocedural examination: Secondary | ICD-10-CM | POA: Diagnosis not present

## 2022-01-06 DIAGNOSIS — E119 Type 2 diabetes mellitus without complications: Secondary | ICD-10-CM | POA: Insufficient documentation

## 2022-01-06 DIAGNOSIS — R45851 Suicidal ideations: Secondary | ICD-10-CM | POA: Insufficient documentation

## 2022-01-06 DIAGNOSIS — R000 Tachycardia, unspecified: Secondary | ICD-10-CM | POA: Insufficient documentation

## 2022-01-06 DIAGNOSIS — Z794 Long term (current) use of insulin: Secondary | ICD-10-CM | POA: Diagnosis not present

## 2022-01-06 DIAGNOSIS — F329 Major depressive disorder, single episode, unspecified: Secondary | ICD-10-CM | POA: Diagnosis not present

## 2022-01-06 DIAGNOSIS — F419 Anxiety disorder, unspecified: Secondary | ICD-10-CM | POA: Insufficient documentation

## 2022-01-06 LAB — CBC
HCT: 37 % (ref 36.0–46.0)
Hemoglobin: 11.3 g/dL — ABNORMAL LOW (ref 12.0–15.0)
MCH: 24.7 pg — ABNORMAL LOW (ref 26.0–34.0)
MCHC: 30.5 g/dL (ref 30.0–36.0)
MCV: 81 fL (ref 80.0–100.0)
Platelets: 260 10*3/uL (ref 150–400)
RBC: 4.57 MIL/uL (ref 3.87–5.11)
RDW: 16.4 % — ABNORMAL HIGH (ref 11.5–15.5)
WBC: 11.6 10*3/uL — ABNORMAL HIGH (ref 4.0–10.5)
nRBC: 0 % (ref 0.0–0.2)

## 2022-01-06 LAB — COMPREHENSIVE METABOLIC PANEL
ALT: 22 U/L (ref 0–44)
AST: 24 U/L (ref 15–41)
Albumin: 3.8 g/dL (ref 3.5–5.0)
Alkaline Phosphatase: 53 U/L (ref 38–126)
Anion gap: 8 (ref 5–15)
BUN: 7 mg/dL (ref 6–20)
CO2: 22 mmol/L (ref 22–32)
Calcium: 8.9 mg/dL (ref 8.9–10.3)
Chloride: 110 mmol/L (ref 98–111)
Creatinine, Ser: 0.77 mg/dL (ref 0.44–1.00)
GFR, Estimated: >60 mL/min (ref >60)
Glucose, Bld: 174 mg/dL — ABNORMAL HIGH (ref 70–99)
Potassium: 3.6 mmol/L (ref 3.5–5.1)
Sodium: 140 mmol/L (ref 135–145)
Total Bilirubin: 0.4 mg/dL (ref 0.3–1.2)
Total Protein: 7.1 g/dL (ref 6.5–8.1)

## 2022-01-06 LAB — ACETAMINOPHEN LEVEL: Acetaminophen (Tylenol), Serum: <10 ug/mL — ABNORMAL LOW (ref 10–30)

## 2022-01-06 LAB — SALICYLATE LEVEL: Salicylate Lvl: <7 mg/dL — ABNORMAL LOW (ref 7.0–30.0)

## 2022-01-06 LAB — RAPID URINE DRUG SCREEN, HOSP PERFORMED
Amphetamines: NOT DETECTED
Barbiturates: NOT DETECTED
Benzodiazepines: NOT DETECTED
Cocaine: NOT DETECTED
Opiates: NOT DETECTED
Tetrahydrocannabinol: NOT DETECTED

## 2022-01-06 LAB — I-STAT BETA HCG BLOOD, ED (MC, WL, AP ONLY): I-stat hCG, quantitative: <5 m[IU]/mL (ref <5)

## 2022-01-06 LAB — CBG MONITORING, ED: Glucose-Capillary: 163 mg/dL — ABNORMAL HIGH (ref 70–99)

## 2022-01-06 LAB — LITHIUM LEVEL: Lithium Lvl: 0.13 mmol/L — ABNORMAL LOW (ref 0.60–1.20)

## 2022-01-06 LAB — ETHANOL: Alcohol, Ethyl (B): <10 mg/dL (ref <10)

## 2022-01-06 NOTE — ED Provider Triage Note (Signed)
Emergency Medicine Provider Triage Evaluation Note  [name] , a 31 y.o. female  was evaluated in triage.  Pt complains of anxiety and depression. States she went to [facility] yesterday and they took her pills away, needs ativan. No medical complaints.  Review of Systems  Positive: Anxiety, depression Negative:   Physical Exam  BP (!) 146/105 (BP Location: Right Arm)   Pulse (!) 113   Temp 98.2 F (36.8 C) (Oral)   Resp 18   LMP 12/09/2021 (Approximate)   SpO2 98%  Gen:   Awake, no distress   Resp:  Normal effort  MSK:   Moves extremities without difficulty  Other:    Medical Decision Making  Medically screening exam initiated at 3:10 PM.  Appropriate orders placed.  [name] was informed that the remainder of the evaluation will be completed by another provider, this initial triage assessment does not replace that evaluation, and the importance of remaining in the ED until their evaluation is complete.     Tacy Learn, PA-C 01/06/22 1514

## 2022-01-06 NOTE — ED Triage Notes (Signed)
Patient here requesting medication adjustment and to be sent back to [facility]. Patient is emotionally labile, shouting about wanting a sandwich and that she is upset that her mother won't let her see a puppy.

## 2022-01-06 NOTE — ED Notes (Signed)
Pt changed into purple scrubs without issue. Items removed from room.

## 2022-01-06 NOTE — Discharge Instructions (Addendum)
Please follow up with the [facility] for 24 hours crisis center.

## 2022-01-06 NOTE — ED Notes (Signed)
Valuables inventoried. $15.00 counted with Jesus NT, amount verified with patient and sealed in valuables envelope with 2 phones, cards, and keys.

## 2022-01-06 NOTE — ED Notes (Signed)
Sitter at bedside.

## 2022-01-06 NOTE — ED Provider Notes (Addendum)
[place] EMERGENCY DEPARTMENT Provider Note   CSN: [id number] Arrival date & time: 01/06/22  1316     History Chief Complaint  Patient presents with   Medical Clearance    [name] is a 31 y.o. female with history of anxiety, depression, bipolar type I, schizoaffective disorder, cognitive deficits presents to the emergency department for evaluation of suicidal ideations with plan for the past few days.  Patient reports for the past couple of weeks she has noticed some increase in her anxiety and depression.  She reports that she has been compliant with her medications however she feels like they are not doing anything for her.  She reports that she has a plan to overdose on her medications, she did not act on this and instead came into the emergency department.  She denies any visual or auditory hallucinations.  She reports that she does have some homicidal ideations towards her boyfriend because he was cheating on her however, she reports he is in jail now.  She reports compliancy to her medications.  HPI     Home Medications Prior to Admission medications   Medication Sig Start Date End Date Taking? Authorizing Provider  amLODipine (NORVASC) 5 MG tablet Take 5 mg by mouth daily.   Yes [provider]  benztropine (COGENTIN) 0.5 MG tablet Take 0.5 mg by mouth 2 (two) times daily.   Yes [provider]  busPIRone (BUSPAR) 15 MG tablet Take 15 mg by mouth 3 (three) times daily.   Yes [provider]  cariprazine (VRAYLAR) 3 MG capsule Take 3 mg by mouth daily.   Yes [provider]  Cholecalciferol (VITAMIN D-3 PO) Take 1 tablet by mouth daily.   Yes [provider]  haloperidol (HALDOL) 10 MG tablet Take 10 mg by mouth at bedtime. Take with '5mg'$  tablet for '15mg'$  dose.   Yes [provider]  haloperidol (HALDOL) 5 MG tablet Take 5 mg by mouth at bedtime. Take with '10mg'$  tablet for '15mg'$  dose.   Yes [provider]  haloperidol decanoate (HALDOL DECANOATE) 100 MG/ML injection Inject 150 mg into the muscle every 30 (thirty) days.   Yes [provider]  insulin detemir (LEVEMIR) 100 UNIT/ML injection Inject 0.4 mLs (40 Units total) into the skin 2 (two) times daily. 10/14/21  Yes Charmaine Downs C, NP  lithium carbonate (ESKALITH) 450 MG CR tablet Take 450 mg by mouth at bedtime.   Yes [provider]  metoprolol tartrate (LOPRESSOR) 25 MG tablet Take 12.5 mg by mouth 2 (two) times daily.   Yes [provider]      Allergies    Wellbutrin [bupropion], Omnipaque [iohexol], Penicillins, Atarax [hydroxyzine], Contrast media [iodinated contrast media], and Cymbalta [duloxetine hcl]    Review of Systems   Review of Systems  Constitutional:  Negative for chills and fever.  Respiratory:  Negative for shortness of breath.   Cardiovascular:  Negative for chest pain.  Gastrointestinal:  Negative for abdominal pain.  Psychiatric/Behavioral:  Positive for dysphoric mood and suicidal ideas. The patient is nervous/anxious.     Physical Exam Updated Vital Signs BP (!) 146/105 (BP Location: Right Arm)   Pulse (!) 113   Temp 98.2 F (36.8 C) (Oral)   Resp 18   LMP 12/09/2021 (Approximate)   SpO2 98%  Physical Exam Vitals and nursing note reviewed.  Constitutional:      General: She is not in acute distress.    Appearance: Normal appearance. She  is not ill-appearing or toxic-appearing.  Eyes:     General: No scleral icterus. Pulmonary:     Effort: Pulmonary effort is normal. No respiratory distress.  Skin:    General: Skin is dry.     Findings: No rash.  Neurological:     General: No focal deficit present.     Mental Status: She is alert. Mental status is at baseline.  Psychiatric:        Attention and Perception: She is attentive. She does not perceive auditory or visual hallucinations.        Mood and Affect: Mood normal.        Thought Content: Thought  content includes homicidal and suicidal ideation. Thought content includes suicidal plan. Thought content does not include homicidal plan.     Comments: She does not appear to be responding to any internal stimuli.  Suicidal with plan.  Homicidal without plan.     ED Results / Procedures / Treatments   Labs (all labs ordered are listed, but only abnormal results are displayed) Labs Reviewed  COMPREHENSIVE METABOLIC PANEL - Abnormal; Notable for the following components:      Result Value   Glucose, Bld 174 (*)    All other components within normal limits  SALICYLATE LEVEL - Abnormal; Notable for the following components:   Salicylate Lvl <2.7 (*)    All other components within normal limits  ACETAMINOPHEN LEVEL - Abnormal; Notable for the following components:   Acetaminophen (Tylenol), Serum <10 (*)    All other components within normal limits  CBC - Abnormal; Notable for the following components:   WBC 11.6 (*)    Hemoglobin 11.3 (*)    MCH 24.7 (*)    RDW 16.4 (*)    All other components within normal limits  CBG MONITORING, ED - Abnormal; Notable for the following components:   Glucose-Capillary 163 (*)    All other components within normal limits  ETHANOL  RAPID URINE DRUG SCREEN, HOSP PERFORMED  LITHIUM LEVEL  I-STAT BETA HCG BLOOD, ED ([facility], [facility], [facility] ONLY)    EKG None  Radiology No results found.  Procedures Procedures   Medications Ordered in ED Medications - No data to display  ED Course/ Medical Decision Making/ A&P Clinical Course as of 01/06/22 1840  Tue Jan 06, 2022  1806 This is a 18 female with a history of bipolar disorder, chronic suicidality, presenting back to the ED with complaint of suicidal ideations.  The patient was just evaluated by the psychiatry services yesterday after holding in the ED, and per Dr Justin Mend note, was felt to be reasonably stable for outpatient management and not requiring repeat inpatient hospitalization.  She just been  discharged from [facility] 3 days ago for similar SI.  She has had 28 visits to the emergency department in the past six months.  There are no grounds for IVC at this time.  There are no new significant developments or changes from the time of her discharge yesterday.  I do not believe she needs another emergent psychiatry evaluation at this time.  I personally reviewed and interpreted her work-up and blood tests, which are at baseline and largely unremarkable.  She will be provided with outpatient resources and strongly encouraged to follow-up with outpatient counseling. [MT]    Clinical Course User Index [MT] Trifan, Carola Rhine, MD  Medical Decision Making Amount and/or Complexity of Data Reviewed Labs: ordered.   31 year old female presents the emergency room for evaluation of suicidal ideations with plan and homicidal ideations without plan.  Differential diagnosis includes is not limited to drug intoxication, worsening psych versus electrolyte abnormality.  Vital signs show slightly elevated blood pressure, slightly tachycardic although patient was initially agitated when the vital signs were recorded.  Physical exam as noted above.  Medical clearance labs were ordered.  I did add on a lithium level as the patient does take lithium daily.  I independent reviewed and interpreted the patient's labs.  BG elevated at 163 although patient known diabetic.  Rapid UDS negative.  Beta-hCG negative.  Acetaminophen, ethanol, salicylate levels are undetectable.  CBC shows slight elevation of white blood cell count 11.6, although could be stress induced.  Hemoglobin 11.3 appears to be around patient's baseline.  CMP again shows elevated glucose otherwise no electrolyte or LFT abnormality.  Lithium level is pending.  On initial presentation to the ER, patient was agitated, asking for food.  Once in the room, she is calm down.  She was seen and evaluated by psychiatry yesterday by Dr.  Lovette Cliche who reports that the patient could benefit from outpatient therapy.  She reported that the patient does not require any acute inpatient psychiatric care.  She reports that she uses the ER as the patient's main coping mechanism.  She reports that prior psychiatric hospitalizations have been only helpful for brief periods in hospital and this patient every time she presents to the ED will only serve to reinforce this behavior.  After discussion my attending, he reports the patient  is safe for discharge and can follow-up with outpatient [facility]. He is ok with discharge even before lithium level results. Information for [facility] given. She verbalizes understanding and agrees to follow up at [facility] later today. Requesting more food before she leaves.   I discussed this case with my attending physician who cosigned this note including patient's presenting symptoms, physical exam, and planned diagnostics and interventions. Attending physician stated agreement with plan or made changes to plan which were implemented.   Final Clinical Impression(s) / ED Diagnoses Final diagnoses:  Suicidal ideation  Homicidal ideation    Rx / DC Orders ED Discharge Orders     None         Sherrell Puller, PA-C 01/06/22 1844    Sherrell Puller, PA-C 01/06/22 1847    Wyvonnia Dusky, MD 01/06/22 2223

## 2022-01-10 ENCOUNTER — Ambulatory Visit (HOSPITAL_COMMUNITY)
Admission: EM | Admit: 2022-01-10 | Discharge: 2022-01-11 | Disposition: A | Discharge status: Home / Self Care | Payer: Medicaid Other | Attending: Psychiatry | Admitting: Psychiatry

## 2022-01-10 DIAGNOSIS — R45851 Suicidal ideations: Secondary | ICD-10-CM | POA: Diagnosis not present

## 2022-01-10 DIAGNOSIS — Z20822 Contact with and (suspected) exposure to covid-19: Secondary | ICD-10-CM | POA: Diagnosis not present

## 2022-01-10 DIAGNOSIS — F332 Major depressive disorder, recurrent severe without psychotic features: Secondary | ICD-10-CM | POA: Diagnosis not present

## 2022-01-10 DIAGNOSIS — F250 Schizoaffective disorder, bipolar type: Secondary | ICD-10-CM | POA: Insufficient documentation

## 2022-01-10 LAB — COMPREHENSIVE METABOLIC PANEL
ALT: 21 U/L (ref 0–44)
AST: 26 U/L (ref 15–41)
Albumin: 3.9 g/dL (ref 3.5–5.0)
Alkaline Phosphatase: 52 U/L (ref 38–126)
Anion gap: 11 (ref 5–15)
BUN: 8 mg/dL (ref 6–20)
CO2: 25 mmol/L (ref 22–32)
Calcium: 9.8 mg/dL (ref 8.9–10.3)
Chloride: 104 mmol/L (ref 98–111)
Creatinine, Ser: 0.79 mg/dL (ref 0.44–1.00)
GFR, Estimated: >60 mL/min (ref >60)
Glucose, Bld: 267 mg/dL — ABNORMAL HIGH (ref 70–99)
Potassium: 3.6 mmol/L (ref 3.5–5.1)
Sodium: 140 mmol/L (ref 135–145)
Total Bilirubin: 0.6 mg/dL (ref 0.3–1.2)
Total Protein: 7 g/dL (ref 6.5–8.1)

## 2022-01-10 LAB — LITHIUM LEVEL: Lithium Lvl: 0.18 mmol/L — ABNORMAL LOW (ref 0.60–1.20)

## 2022-01-10 LAB — POC SARS CORONAVIRUS 2 AG: SARSCOV2ONAVIRUS 2 AG: NEGATIVE

## 2022-01-10 LAB — CBC WITH DIFFERENTIAL/PLATELET
Abs Immature Granulocytes: 0.04 10*3/uL (ref 0.00–0.07)
Basophils Absolute: 0.1 10*3/uL (ref 0.0–0.1)
Basophils Relative: 1 %
Eosinophils Absolute: 0.4 10*3/uL (ref 0.0–0.5)
Eosinophils Relative: 4 %
HCT: 35 % — ABNORMAL LOW (ref 36.0–46.0)
Hemoglobin: 10.8 g/dL — ABNORMAL LOW (ref 12.0–15.0)
Immature Granulocytes: 0 %
Lymphocytes Relative: 35 %
Lymphs Abs: 3.8 10*3/uL (ref 0.7–4.0)
MCH: 24.4 pg — ABNORMAL LOW (ref 26.0–34.0)
MCHC: 30.9 g/dL (ref 30.0–36.0)
MCV: 79 fL — ABNORMAL LOW (ref 80.0–100.0)
Monocytes Absolute: 0.6 10*3/uL (ref 0.1–1.0)
Monocytes Relative: 6 %
Neutro Abs: 6 10*3/uL (ref 1.7–7.7)
Neutrophils Relative %: 54 %
Platelets: 280 10*3/uL (ref 150–400)
RBC: 4.43 MIL/uL (ref 3.87–5.11)
RDW: 16.4 % — ABNORMAL HIGH (ref 11.5–15.5)
WBC: 11 10*3/uL — ABNORMAL HIGH (ref 4.0–10.5)
nRBC: 0 % (ref 0.0–0.2)

## 2022-01-10 LAB — POC SARS CORONAVIRUS 2 AG -  ED: SARS Coronavirus 2 Ag: NEGATIVE

## 2022-01-10 LAB — TSH: TSH: 0.983 u[IU]/mL (ref 0.350–4.500)

## 2022-01-10 LAB — RESP PANEL BY RT-PCR (FLU A&B, COVID) ARPGX2
Influenza A by PCR: NEGATIVE
Influenza B by PCR: NEGATIVE
SARS Coronavirus 2 by RT PCR: NEGATIVE

## 2022-01-10 LAB — ETHANOL: Alcohol, Ethyl (B): <10 mg/dL (ref <10)

## 2022-01-10 LAB — GLUCOSE, CAPILLARY: Glucose-Capillary: 279 mg/dL — ABNORMAL HIGH (ref 70–99)

## 2022-01-10 LAB — HEMOGLOBIN A1C
Hgb A1c MFr Bld: 7.6 % — ABNORMAL HIGH (ref 4.8–5.6)
Mean Plasma Glucose: 171.42 mg/dL

## 2022-01-10 LAB — MAGNESIUM: Magnesium: 2 mg/dL (ref 1.7–2.4)

## 2022-01-10 MED ORDER — AMLODIPINE BESYLATE 5 MG PO TABS
5.0000 mg | ORAL_TABLET | Freq: Every day | ORAL | Status: DC
  Administered 2022-01-11: 5 mg via ORAL
  Filled 2022-01-10: qty 1

## 2022-01-10 MED ORDER — BUSPIRONE HCL 15 MG PO TABS
15.0000 mg | ORAL_TABLET | Freq: Three times a day (TID) | ORAL | Status: DC
  Administered 2022-01-11: 15 mg via ORAL
  Filled 2022-01-10 (×2): qty 1

## 2022-01-10 MED ORDER — ACETAMINOPHEN 325 MG PO TABS: 650.0000 mg | ORAL_TABLET | Freq: Four times a day (QID) | ORAL | Status: DC | PRN

## 2022-01-10 MED ORDER — LORAZEPAM 1 MG PO TABS
1.0000 mg | ORAL_TABLET | Freq: Once | ORAL | Status: AC
  Administered 2022-01-10: 1 mg via ORAL
  Filled 2022-01-10: qty 1

## 2022-01-10 MED ORDER — MAGNESIUM HYDROXIDE 400 MG/5ML PO SUSP: 30.0000 mL | Freq: Every day | ORAL | Status: DC | PRN

## 2022-01-10 MED ORDER — BENZTROPINE MESYLATE 0.5 MG PO TABS
0.5000 mg | ORAL_TABLET | Freq: Two times a day (BID) | ORAL | Status: DC
  Administered 2022-01-10 – 2022-01-11 (×2): 0.5 mg via ORAL
  Filled 2022-01-10 (×2): qty 1

## 2022-01-10 MED ORDER — TRAZODONE HCL 50 MG PO TABS
50.0000 mg | ORAL_TABLET | Freq: Every evening | ORAL | Status: DC | PRN
  Administered 2022-01-10: 50 mg via ORAL
  Filled 2022-01-10: qty 1

## 2022-01-10 MED ORDER — ALUM & MAG HYDROXIDE-SIMETH 200-200-20 MG/5ML PO SUSP: 30.0000 mL | ORAL | Status: DC | PRN

## 2022-01-10 NOTE — Progress Notes (Signed)
   01/10/22 1615  [place] (Walk-ins at [facility] only)  How Did You Hear About [place]? Self  What Is the Reason for Your Visit/Call Today? Patient presents voluntarily via [employer] reporting worsening depression and SI with plan to overdose if discharged.  She denies HI, AVH or recent substance use.  How Long Has This Been Causing You Problems? <Week  Have You Recently Had Any Thoughts About Hurting Yourself? Yes  How long ago did you have thoughts about hurting yourself? Current SI  Are You Planning to Commit Suicide/Harm Yourself At This time? Yes  Have you Recently Had Thoughts About Hurting Someone [name]? No  Are You Planning To Harm Someone At This Time? No  Are you currently experiencing any auditory, visual or other hallucinations? No  Have You Used Any Alcohol or Drugs in the Past 24 Hours? No  Do you have any current medical co-morbidities that require immediate attention? No  Clinician description of patient physical appearance/behavior: Patient has a shower cap on, seems well groomed with casual attire.  She is AAOx4.  What Do You Feel Would Help You the Most Today? Treatment for Depression or other mood problem  If access to [facility] was not available, would you have sought care in the Emergency Department? Yes  Determination of Need Urgent (48 hours)  Options For Referral Inpatient Hospitalization

## 2022-01-10 NOTE — ED Notes (Signed)
Patient has been familiarized with the unit. Nutrition given. No distress noted. Will continue to monitor for safety
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
## 2022-01-10 NOTE — ED Notes (Signed)
Remains focused on discharge and going to [facility] ED. Behavior remains appropriate no aggression or acting out and is easily redirected by staff. Affect flat mood labile but self controlled with minor redirection by staff.

## 2022-01-10 NOTE — ED Notes (Signed)
Patient is wanting to leave.  Provider made aware of patient wanting to leave and wanting to see provider. Patient does deny SI/HI

## 2022-01-10 NOTE — ED Notes (Signed)
PT unable to give urine sample at this time as she stated she had already went to the bathroom prior

## 2022-01-10 NOTE — ED Provider Notes (Signed)
[facility] Continuous Assessment Admission H&P  Date: 01/10/22 Patient Name: [name] MRN: [id number] Chief Complaint: No chief complaint on file.     Diagnoses:  Final diagnoses:  Severe episode of recurrent major depressive disorder, without psychotic features ([place])  Suicidal ideations    [id number] presented to [facility] as a walk in via [facility] with complaints of increased depression and suicidal ideations with a plan to overdose.   [name], 31 y.o., female patient seen face to face by this provider, consulted with Dr. Dwyane Dee; and chart reviewed on 01/10/22.  Per chart review patient has a past psychiatric history of schizoaffective disorder bipolar type, MDD, OCD, and anxiety.  She has services in place with [facility] team.  She reports compliance with her medications.  States she takes gabapentin and lithium, she cannot remember the dosage.  Upon assessment [name] is observed sitting in the assessment room.  She is sitting on the edge of her chair and tapping her foot on the floor and appears to be extremely anxious. She is labile. She is disheveled and makes fleeting eye contact.  She is alert/oriented x4 and cooperative.  She is speaking in a clear tone and at a moderate volume.  However when she talks about her mother her voice becomes loud and pressured. She becomes extremely tearful and she states "my mom is going to go to hell because of the way she treats me".  She endorses increasing depression over the past 4-5 days.  She does not identify any recent stressors/triggers other than, "my medicines are not working".  She endorses feelings of helplessness, hopelessness, decreased motivation, feelings of worthlessness, decreased sleep (4 hours per night) and an increase in her appetite.  She endorses suicidal ideations with a plan to overdose on her medications.  She cannot contract for safety.  She has a history of suicide attempts.  She denies  HI/AVH.  Objectively she does not appear to be responding to internal/external stimuli.      PHQ 2-9:  [place] ED from 10/24/2021 in [place] from 07/05/2020 in [facility] [place] at [facility] from 05/26/2020 in [place] DEPT  Thoughts that you would be better off dead, or of hurting yourself in some way Several days Not at all More than half the days  PHQ-9 Total Score '7 4 18       '$ Flowsheet Row ED from 01/06/2022 in [facility] from 01/04/2022 in [facility] ED from 01/02/2022 in [place] DEPT  C-SSRS RISK CATEGORY High Risk High Risk High Risk        Total Time spent with patient: 30 minutes  Musculoskeletal  Strength & Muscle Tone: within normal limits Gait & Station: normal Patient leans: N/A  Psychiatric Specialty Exam  Presentation General Appearance: Disheveled  Eye Contact:Fair  Speech:Clear and Coherent; Pressured  Speech Volume:Normal  Handedness:Right   Mood and Affect  Mood:Anxious; Depressed; Hopeless; Labile; Irritable  Affect:Congruent   Thought Process  Thought Processes:Coherent  Descriptions of Associations:Intact  Orientation:Full (Time, Place and Person)  Thought Content:Logical  Diagnosis of Schizophrenia or Schizoaffective disorder in past: Yes  Duration of Psychotic Symptoms: Greater than six months  Hallucinations:Hallucinations: None  Ideas of Reference:None  Suicidal Thoughts:Suicidal Thoughts: Yes, Active SI Active Intent and/or Plan: With Intent; With Plan; With Means to [place]  Thoughts:Homicidal Thoughts: No   Sensorium  Memory:Immediate Fair; Recent Fair; Remote Fair  Judgment:Poor  Insight:Poor   Executive Functions  Concentration:Fair  Attention Span:Fair  Charles Mix  Language:Good   Psychomotor Activity  Psychomotor Activity:Psychomotor Activity: Normal   Assets  Assets:Communication Skills; Desire for Improvement; Financial Resources/Insurance; Housing; Physical Health; Resilience; Leisure Time   Sleep  Sleep:Sleep: Poor Number of Hours of Sleep: 4   Nutritional Assessment (For OBS and FBC admissions only) Has the patient had a weight loss or gain of 10 pounds or more in the last 3 months?: No Has the patient had a decrease in food intake/or appetite?: No Does the patient have dental problems?: No Does the patient have eating habits or behaviors that may be indicators of an eating disorder including binging or inducing vomiting?: No Has the patient recently lost weight without trying?: 2.0 Has the patient been eating poorly because of a decreased appetite?: 1 Malnutrition Screening Tool Score: 3    Physical Exam ROS  Blood pressure 118/79, pulse (!) 108, temperature 98.1 F (36.7 C), temperature source Oral, resp. rate 18, height '5\' 9"'$  (1.753 m), weight 210 lb (95.3 kg), last menstrual period 12/09/2021, SpO2 97 %. Body mass index is 31.01 kg/m.  Past Psychiatric History: Per chart review patient has a past psychiatric history of schizoaffective disorder bipolar type, MDD, OCD, and anxiety.  Is the patient at risk to self? Yes  Has the patient been a risk to self in the past 6 months? Yes .    Has the patient been a risk to self within the distant past? Yes   Is the patient a risk to others? No   Has the patient been a risk to others in the past 6 months? No   Has the patient been a risk to others within the distant past? No   Past Medical History:  Past Medical History:  Diagnosis Date   Anxiety    Bipolar 1 disorder ([place])    Cognitive deficits    Depression    Diabetes mellitus without complication ([place])    Hypertension    Mental disorder    Mental health disorder    Obesity     Past Surgical History:   Procedure Laterality Date   CESAREAN SECTION     CESAREAN SECTION N/A 04/25/2013   Procedure: REPEAT CESAREAN SECTION;  Surgeon: [name], MD;  Location: [facility];  Service: Obstetrics;  Laterality: N/A;   MASS EXCISION N/A 06/03/2012   Procedure: EXCISION MASS;  Surgeon: [name], MD;  Location: [facility];  Service: ENT;  Laterality: N/A;  Excision uvula mass   TONSILLECTOMY N/A 06/03/2012   Procedure: TONSILLECTOMY;  Surgeon: [name], MD;  Location: [facility];  Service: ENT;  Laterality: N/A;   TONSILLECTOMY      Family History:  Family History  Problem Relation Age of Onset   Hypertension Mother    Diabetes Father     Social History:  Social History   Socioeconomic History   Marital status: Single    Spouse name: Not on file   Number of children: Not on file   Years of education: Not on file   Highest education level: Not on file  Occupational History   Not on file  Tobacco Use   Smoking status: Every Day    Types: Cigars   Smokeless tobacco: Never   Tobacco comments:    Pt declined  Vaping Use  Vaping Use: Never used  Substance and Sexual Activity   Alcohol use: Not Currently   Drug use: Not Currently    Types: "Crack" cocaine, Other-see comments    Comment: Patient reports hx of smoking Crack   Sexual activity: Not Currently    Birth control/protection: None    Comment: occasionally  Other Topics Concern   Not on file  Social History Narrative   ** Merged History Encounter **       Social Determinants of Health   Financial Resource Strain: Not on file  Food Insecurity: Not on file  Transportation Needs: Not on file  Physical Activity: Not on file  Stress: Not on file  Social Connections: Not on file  Intimate Partner Violence: Not on file    SDOH:  SDOH Screenings   Alcohol Screen: Low Risk  (05/02/2020)  Depression (PHQ2-9): Medium Risk (10/24/2021)  Tobacco Use: High Risk (01/02/2022)    Last  Labs:  Admission on 01/06/2022, Discharged on 01/06/2022  Component Date Value Ref Range Status   Glucose-Capillary 01/06/2022 163 (H)  70 - 99 mg/dL Final   Glucose reference range applies only to samples taken after fasting for at least 8 hours.   Sodium 01/06/2022 140  135 - 145 mmol/L Final   Potassium 01/06/2022 3.6  3.5 - 5.1 mmol/L Final   Chloride 01/06/2022 110  98 - 111 mmol/L Final   CO2 01/06/2022 22  22 - 32 mmol/L Final   Glucose, Bld 01/06/2022 174 (H)  70 - 99 mg/dL Final   Glucose reference range applies only to samples taken after fasting for at least 8 hours.   BUN 01/06/2022 7  6 - 20 mg/dL Final   Creatinine, Ser 01/06/2022 0.77  0.44 - 1.00 mg/dL Final   Calcium 01/06/2022 8.9  8.9 - 10.3 mg/dL Final   Total Protein 01/06/2022 7.1  6.5 - 8.1 g/dL Final   Albumin 01/06/2022 3.8  3.5 - 5.0 g/dL Final   AST 01/06/2022 24  15 - 41 U/L Final   ALT 01/06/2022 22  0 - 44 U/L Final   Alkaline Phosphatase 01/06/2022 53  38 - 126 U/L Final   Total Bilirubin 01/06/2022 0.4  0.3 - 1.2 mg/dL Final   GFR, Estimated 01/06/2022 >60  >60 mL/min Final   Comment: (NOTE) Calculated using the CKD-EPI Creatinine Equation (2021)    Anion gap 01/06/2022 8  5 - 15 Final   Performed at [facility] [address], [place], [place] [address]   Alcohol, Ethyl (B) 01/06/2022 <10  <10 mg/dL Final   Comment: (NOTE) Lowest detectable limit for serum alcohol is 10 mg/dL.  For medical purposes only. Performed at [facility], [place] [address], [place], [place] [address]    Salicylate Lvl 81/44/8185 <7.0 (L)  7.0 - 30.0 mg/dL Final   Performed at [facility] [address] [place], [place] [address]   Acetaminophen (Tylenol), Serum 01/06/2022 <10 (L)  10 - 30 ug/mL Final   Comment: (NOTE) Therapeutic concentrations vary significantly. A range of 10-30 ug/mL  may be an effective concentration for many patients. However, some  are best treated at concentrations outside of  this range. Acetaminophen concentrations >150 ug/mL at 4 hours after ingestion  and >50 ug/mL at 12 hours after ingestion are often associated with  toxic reactions.  Performed at [facility], [place] [address], [place], [place] [address]    WBC 01/06/2022 11.6 (H)  4.0 -  10.5 K/uL Final   RBC 01/06/2022 4.57  3.87 - 5.11 MIL/uL Final   Hemoglobin 01/06/2022 11.3 (L)  12.0 - 15.0 g/dL Final   HCT 01/06/2022 37.0  36.0 - 46.0 % Final   MCV 01/06/2022 81.0  80.0 - 100.0 fL Final   MCH 01/06/2022 24.7 (L)  26.0 - 34.0 pg Final   MCHC 01/06/2022 30.5  30.0 - 36.0 g/dL Final   RDW 01/06/2022 16.4 (H)  11.5 - 15.5 % Final   Platelets 01/06/2022 260  150 - 400 K/uL Final   nRBC 01/06/2022 0.0  0.0 - 0.2 % Final   Performed at [facility], [place] [address], [place], [place] [address]   Opiates 01/06/2022 NONE DETECTED  NONE DETECTED Final   Cocaine 01/06/2022 NONE DETECTED  NONE DETECTED Final   Benzodiazepines 01/06/2022 NONE DETECTED  NONE DETECTED Final   Amphetamines 01/06/2022 NONE DETECTED  NONE DETECTED Final   Tetrahydrocannabinol 01/06/2022 NONE DETECTED  NONE DETECTED Final   Barbiturates 01/06/2022 NONE DETECTED  NONE DETECTED Final   Comment: (NOTE) DRUG SCREEN FOR MEDICAL PURPOSES ONLY.  IF CONFIRMATION IS NEEDED FOR ANY PURPOSE, NOTIFY LAB WITHIN 5 DAYS.  LOWEST DETECTABLE LIMITS FOR URINE DRUG SCREEN Drug Class                     Cutoff (ng/mL) Amphetamine and metabolites    1000 Barbiturate and metabolites    200 Benzodiazepine                 443 Tricyclics and metabolites     300 Opiates and metabolites        300 Cocaine and metabolites        300 THC                            50 Performed at [facility], [place] [address], [place], [place] [address]    I-stat hCG, quantitative 01/06/2022 <5.0  <5 mIU/mL Final   Comment 3 01/06/2022          Final   Comment:   GEST. AGE      CONC.  (mIU/mL)   <=1 WEEK        5 - 50     2 WEEKS       50 - 500      3 WEEKS       100 - 10,000     4 WEEKS     1,000 - 30,000        FEMALE AND NON-PREGNANT FEMALE:     LESS THAN 5 mIU/mL    Lithium Lvl 01/06/2022 0.13 (L)  0.60 - 1.20 mmol/L Final   Performed at [facility], [place] [address] [place], [place] [address]  Admission on 01/04/2022, Discharged on 01/05/2022  Component Date Value Ref Range Status   SARS Coronavirus 2 by RT PCR 01/04/2022 NEGATIVE  NEGATIVE Final   Comment: (NOTE) SARS-CoV-2 target nucleic acids are NOT DETECTED.  The SARS-CoV-2 RNA is generally detectable in upper respiratory specimens during the acute phase of infection. The lowest concentration of SARS-CoV-2 viral copies this assay can detect is 138 copies/mL. A negative result does not preclude SARS-Cov-2 infection and should not be used as the sole basis for treatment or other patient management decisions. A negative result may occur with  improper specimen collection/handling, submission of specimen other than nasopharyngeal swab, presence of viral  mutation(s) within the areas targeted by this assay, and inadequate number of viral copies(<138 copies/mL). A negative result must be combined with clinical observations, patient history, and epidemiological information. The expected result is Negative.  Fact Sheet for Patients:  EntrepreneurPulse.com.au  Fact Sheet for Healthcare Providers:  IncredibleEmployment.be  This test is no                          t yet approved or cleared by the [place] FDA and  has been authorized for detection and/or diagnosis of SARS-CoV-2 by FDA under an Emergency Use Authorization (EUA). This EUA will remain  in effect (meaning this test can be used) for the duration of the COVID-19 declaration under Section 564(b)(1) of the Act, 21 U.S.C.section 360bbb-3(b)(1), unless the authorization is terminated  or revoked sooner.       Influenza A by PCR 01/04/2022 NEGATIVE  NEGATIVE Final    Influenza B by PCR 01/04/2022 NEGATIVE  NEGATIVE Final   Comment: (NOTE) The Xpert Xpress SARS-CoV-2/FLU/RSV plus assay is intended as an aid in the diagnosis of influenza from Nasopharyngeal swab specimens and should not be used as a sole basis for treatment. Nasal washings and aspirates are unacceptable for Xpert Xpress SARS-CoV-2/FLU/RSV testing.  Fact Sheet for Patients: EntrepreneurPulse.com.au  Fact Sheet for Healthcare Providers: IncredibleEmployment.be  This test is not yet approved or cleared by the [place] FDA and has been authorized for detection and/or diagnosis of SARS-CoV-2 by FDA under an Emergency Use Authorization (EUA). This EUA will remain in effect (meaning this test can be used) for the duration of the COVID-19 declaration under Section 564(b)(1) of the Act, 21 U.S.C. section 360bbb-3(b)(1), unless the authorization is terminated or revoked.  Performed at [facility], [place] [address] [place], [place] [address]    SARSCOV2ONAVIRUS 2 AG 01/04/2022 NEGATIVE  NEGATIVE Final   Comment: (NOTE) SARS-CoV-2 antigen NOT DETECTED.   Negative results are presumptive.  Negative results do not preclude SARS-CoV-2 infection and should not be used as the sole basis for treatment or other patient management decisions, including infection  control decisions, particularly in the presence of clinical signs and  symptoms consistent with COVID-19, or in those who have been in contact with the virus.  Negative results must be combined with clinical observations, patient history, and epidemiological information. The expected result is Negative.  Fact Sheet for Patients: HandmadeRecipes.com.cy  Fact Sheet for Healthcare Providers: FuneralLife.at  This test is not yet approved or cleared by the [place] FDA and  has been authorized for detection and/or diagnosis of SARS-CoV-2  by FDA under an Emergency Use Authorization (EUA).  This EUA will remain in effect (meaning this test can be used) for the duration of  the COV                          ID-19 declaration under Section 564(b)(1) of the Act, 21 U.S.C. section 360bbb-3(b)(1), unless the authorization is terminated or revoked sooner.    Admission on 01/02/2022, Discharged on 01/03/2022  Component Date Value Ref Range Status   WBC 01/02/2022 8.5  4.0 - 10.5 K/uL Final   RBC 01/02/2022 4.21  3.87 - 5.11 MIL/uL Final   Hemoglobin 01/02/2022 10.4 (L)  12.0 - 15.0 g/dL Final   HCT 01/02/2022 33.8 (L)  36.0 - 46.0 % Final   MCV 01/02/2022 80.3  80.0 - 100.0 fL Final   [facility] 01/02/2022  24.7 (L)  26.0 - 34.0 pg Final   MCHC 01/02/2022 30.8  30.0 - 36.0 g/dL Final   RDW 01/02/2022 15.9 (H)  11.5 - 15.5 % Final   Platelets 01/02/2022 205  150 - 400 K/uL Final   nRBC 01/02/2022 0.0  0.0 - 0.2 % Final   Neutrophils Relative % 01/02/2022 50  % Final   Neutro Abs 01/02/2022 4.2  1.7 - 7.7 K/uL Final   Lymphocytes Relative 01/02/2022 37  % Final   Lymphs Abs 01/02/2022 3.2  0.7 - 4.0 K/uL Final   Monocytes Relative 01/02/2022 5  % Final   Monocytes Absolute 01/02/2022 0.4  0.1 - 1.0 K/uL Final   Eosinophils Relative 01/02/2022 7  % Final   Eosinophils Absolute 01/02/2022 0.6 (H)  0.0 - 0.5 K/uL Final   Basophils Relative 01/02/2022 1  % Final   Basophils Absolute 01/02/2022 0.1  0.0 - 0.1 K/uL Final   Immature Granulocytes 01/02/2022 0  % Final   Abs Immature Granulocytes 01/02/2022 0.03  0.00 - 0.07 K/uL Final   Performed at [facility], [place] [address], [place], [place] [address]   Sodium 01/02/2022 139  135 - 145 mmol/L Final   Potassium 01/02/2022 3.5  3.5 - 5.1 mmol/L Final   Chloride 01/02/2022 110  98 - 111 mmol/L Final   CO2 01/02/2022 25  22 - 32 mmol/L Final   Glucose, Bld 01/02/2022 178 (H)  70 - 99 mg/dL Final   Glucose reference range applies only to samples taken after fasting for  at least 8 hours.   BUN 01/02/2022 11  6 - 20 mg/dL Final   Creatinine, Ser 01/02/2022 0.78  0.44 - 1.00 mg/dL Final   Calcium 01/02/2022 8.8 (L)  8.9 - 10.3 mg/dL Final   Total Protein 01/02/2022 6.8  6.5 - 8.1 g/dL Final   Albumin 01/02/2022 3.8  3.5 - 5.0 g/dL Final   AST 01/02/2022 22  15 - 41 U/L Final   ALT 01/02/2022 21  0 - 44 U/L Final   Alkaline Phosphatase 01/02/2022 49  38 - 126 U/L Final   Total Bilirubin 01/02/2022 0.4  0.3 - 1.2 mg/dL Final   GFR, Estimated 01/02/2022 >60  >60 mL/min Final   Comment: (NOTE) Calculated using the CKD-EPI Creatinine Equation (2021)    Anion gap 01/02/2022 4 (L)  5 - 15 Final   Performed at [facility], [place] [address], [place], [place] [address]   Alcohol, Ethyl (B) 01/02/2022 <10  <10 mg/dL Final   Comment: (NOTE) Lowest detectable limit for serum alcohol is 10 mg/dL.  For medical purposes only. Performed at [facility], [place] [address], [place], [place] [address]    Opiates 01/02/2022 NONE DETECTED  NONE DETECTED Final   Cocaine 01/02/2022 NONE DETECTED  NONE DETECTED Final   Benzodiazepines 01/02/2022 NONE DETECTED  NONE DETECTED Final   Amphetamines 01/02/2022 NONE DETECTED  NONE DETECTED Final   Tetrahydrocannabinol 01/02/2022 NONE DETECTED  NONE DETECTED Final   Barbiturates 01/02/2022 NONE DETECTED  NONE DETECTED Final   Comment: (NOTE) DRUG SCREEN FOR MEDICAL PURPOSES ONLY.  IF CONFIRMATION IS NEEDED FOR ANY PURPOSE, NOTIFY LAB WITHIN 5 DAYS.  LOWEST DETECTABLE LIMITS FOR URINE DRUG SCREEN Drug Class                     Cutoff (ng/mL) Amphetamine and metabolites    1000 Barbiturate and metabolites    200 Benzodiazepine  673 Tricyclics and metabolites     300 Opiates and metabolites        300 Cocaine and metabolites        300 THC                            50 Performed at [facility], [place] [address] [place], [place] [address]    Lithium Lvl  01/02/2022 0.29 (L)  0.60 - 1.20 mmol/L Final   Performed at [facility] [address], [place], [place] [address]   SARS Coronavirus 2 by RT PCR 01/02/2022 NEGATIVE  NEGATIVE Final   Comment: (NOTE) SARS-CoV-2 target nucleic acids are NOT DETECTED.  The SARS-CoV-2 RNA is generally detectable in upper respiratory specimens during the acute phase of infection. The lowest concentration of SARS-CoV-2 viral copies this assay can detect is 138 copies/mL. A negative result does not preclude SARS-Cov-2 infection and should not be used as the sole basis for treatment or other patient management decisions. A negative result may occur with  improper specimen collection/handling, submission of specimen other than nasopharyngeal swab, presence of viral mutation(s) within the areas targeted by this assay, and inadequate number of viral copies(<138 copies/mL). A negative result must be combined with clinical observations, patient history, and epidemiological information. The expected result is Negative.  Fact Sheet for Patients:  EntrepreneurPulse.com.au  Fact Sheet for Healthcare Providers:  IncredibleEmployment.be  This test is no                          t yet approved or cleared by the [place] FDA and  has been authorized for detection and/or diagnosis of SARS-CoV-2 by FDA under an Emergency Use Authorization (EUA). This EUA will remain  in effect (meaning this test can be used) for the duration of the COVID-19 declaration under Section 564(b)(1) of the Act, 21 U.S.C.section 360bbb-3(b)(1), unless the authorization is terminated  or revoked sooner.       Influenza A by PCR 01/02/2022 NEGATIVE  NEGATIVE Final   Influenza B by PCR 01/02/2022 NEGATIVE  NEGATIVE Final   Comment: (NOTE) The Xpert Xpress SARS-CoV-2/FLU/RSV plus assay is intended as an aid in the diagnosis of influenza from Nasopharyngeal swab specimens and should  not be used as a sole basis for treatment. Nasal washings and aspirates are unacceptable for Xpert Xpress SARS-CoV-2/FLU/RSV testing.  Fact Sheet for Patients: EntrepreneurPulse.com.au  Fact Sheet for Healthcare Providers: IncredibleEmployment.be  This test is not yet approved or cleared by the [place] FDA and has been authorized for detection and/or diagnosis of SARS-CoV-2 by FDA under an Emergency Use Authorization (EUA). This EUA will remain in effect (meaning this test can be used) for the duration of the COVID-19 declaration under Section 564(b)(1) of the Act, 21 U.S.C. section 360bbb-3(b)(1), unless the authorization is terminated or revoked.  Performed at [facility], [place] [address], [place], [place] [address]    hCG, Ollen Barges, Quant, S 01/02/2022 <1  <5 mIU/mL Final   Comment:          GEST. AGE      CONC.  (mIU/mL)   <=1 WEEK        5 - 50     2 WEEKS       50 - 500     3 WEEKS       100 - 10,000  4 WEEKS     1,000 - 30,000     5 WEEKS     3,500 - 115,000   6-8 WEEKS     12,000 - 270,000    12 WEEKS     15,000 - 220,000        FEMALE AND NON-PREGNANT FEMALE:     LESS THAN 5 mIU/mL Performed at [facility], [place] [address] [place], [place] [address]    Glucose-Capillary 01/03/2022 176 (H)  70 - 99 mg/dL Final   Glucose reference range applies only to samples taken after fasting for at least 8 hours.   Glucose-Capillary 01/03/2022 164 (H)  70 - 99 mg/dL Final   Glucose reference range applies only to samples taken after fasting for at least 8 hours.  Admission on 12/24/2021, Discharged on 12/25/2021  Component Date Value Ref Range Status   SARS Coronavirus 2 by RT PCR 12/24/2021 NEGATIVE  NEGATIVE Final   Comment: (NOTE) SARS-CoV-2 target nucleic acids are NOT DETECTED.  The SARS-CoV-2 RNA is generally detectable in upper respiratory specimens during the acute phase of infection.  The lowest concentration of SARS-CoV-2 viral copies this assay can detect is 138 copies/mL. A negative result does not preclude SARS-Cov-2 infection and should not be used as the sole basis for treatment or other patient management decisions. A negative result may occur with  improper specimen collection/handling, submission of specimen other than nasopharyngeal swab, presence of viral mutation(s) within the areas targeted by this assay, and inadequate number of viral copies(<138 copies/mL). A negative result must be combined with clinical observations, patient history, and epidemiological information. The expected result is Negative.  Fact Sheet for Patients:  EntrepreneurPulse.com.au  Fact Sheet for Healthcare Providers:  IncredibleEmployment.be  This test is no                          t yet approved or cleared by the [place] FDA and  has been authorized for detection and/or diagnosis of SARS-CoV-2 by FDA under an Emergency Use Authorization (EUA). This EUA will remain  in effect (meaning this test can be used) for the duration of the COVID-19 declaration under Section 564(b)(1) of the Act, 21 U.S.C.section 360bbb-3(b)(1), unless the authorization is terminated  or revoked sooner.       Influenza A by PCR 12/24/2021 NEGATIVE  NEGATIVE Final   Influenza B by PCR 12/24/2021 NEGATIVE  NEGATIVE Final   Comment: (NOTE) The Xpert Xpress SARS-CoV-2/FLU/RSV plus assay is intended as an aid in the diagnosis of influenza from Nasopharyngeal swab specimens and should not be used as a sole basis for treatment. Nasal washings and aspirates are unacceptable for Xpert Xpress SARS-CoV-2/FLU/RSV testing.  Fact Sheet for Patients: EntrepreneurPulse.com.au  Fact Sheet for Healthcare Providers: IncredibleEmployment.be  This test is not yet approved or cleared by the [place] FDA and has been authorized for  detection and/or diagnosis of SARS-CoV-2 by FDA under an Emergency Use Authorization (EUA). This EUA will remain in effect (meaning this test can be used) for the duration of the COVID-19 declaration under Section 564(b)(1) of the Act, 21 U.S.C. section 360bbb-3(b)(1), unless the authorization is terminated or revoked.  Performed at [facility], [place] [address] [place], [place] [address]    Sodium 12/24/2021 141  135 - 145 mmol/L Final   Potassium 12/24/2021 3.6  3.5 - 5.1 mmol/L Final   Chloride 12/24/2021 109  98 - 111 mmol/L Final  CO2 12/24/2021 23  22 - 32 mmol/L Final   Glucose, Bld 12/24/2021 163 (H)  70 - 99 mg/dL Final   Glucose reference range applies only to samples taken after fasting for at least 8 hours.   BUN 12/24/2021 10  6 - 20 mg/dL Final   Creatinine, Ser 12/24/2021 0.92  0.44 - 1.00 mg/dL Final   Calcium 12/24/2021 9.9  8.9 - 10.3 mg/dL Final   Total Protein 12/24/2021 8.0  6.5 - 8.1 g/dL Final   Albumin 12/24/2021 4.4  3.5 - 5.0 g/dL Final   AST 12/24/2021 25  15 - 41 U/L Final   ALT 12/24/2021 19  0 - 44 U/L Final   Alkaline Phosphatase 12/24/2021 46  38 - 126 U/L Final   Total Bilirubin 12/24/2021 0.6  0.3 - 1.2 mg/dL Final   GFR, Estimated 12/24/2021 >60  >60 mL/min Final   Comment: (NOTE) Calculated using the CKD-EPI Creatinine Equation (2021)    Anion gap 12/24/2021 9  5 - 15 Final   Performed at [facility], [place] [address], [place], [place] [address]   Alcohol, Ethyl (B) 12/24/2021 <10  <10 mg/dL Final   Comment: (NOTE) Lowest detectable limit for serum alcohol is 10 mg/dL.  For medical purposes only. Performed at [facility], [place] [address], [place], [place] [address]    WBC 12/24/2021 12.8 (H)  4.0 - 10.5 K/uL Final   RBC 12/24/2021 4.90  3.87 - 5.11 MIL/uL Final   Hemoglobin 12/24/2021 12.1  12.0 - 15.0 g/dL Final   HCT 12/24/2021 38.5  36.0 - 46.0 % Final   MCV 12/24/2021 78.6 (L)   80.0 - 100.0 fL Final   MCH 12/24/2021 24.7 (L)  26.0 - 34.0 pg Final   MCHC 12/24/2021 31.4  30.0 - 36.0 g/dL Final   RDW 12/24/2021 15.9 (H)  11.5 - 15.5 % Final   Platelets 12/24/2021 283  150 - 400 K/uL Final   nRBC 12/24/2021 0.0  0.0 - 0.2 % Final   Neutrophils Relative % 12/24/2021 67  % Final   Neutro Abs 12/24/2021 8.5 (H)  1.7 - 7.7 K/uL Final   Lymphocytes Relative 12/24/2021 22  % Final   Lymphs Abs 12/24/2021 2.9  0.7 - 4.0 K/uL Final   Monocytes Relative 12/24/2021 6  % Final   Monocytes Absolute 12/24/2021 0.8  0.1 - 1.0 K/uL Final   Eosinophils Relative 12/24/2021 4  % Final   Eosinophils Absolute 12/24/2021 0.5  0.0 - 0.5 K/uL Final   Basophils Relative 12/24/2021 1  % Final   Basophils Absolute 12/24/2021 0.1  0.0 - 0.1 K/uL Final   Immature Granulocytes 12/24/2021 0  % Final   Abs Immature Granulocytes 12/24/2021 0.03  0.00 - 0.07 K/uL Final   Performed at [facility], [place] [address], [place], [place] [address]   I-stat hCG, quantitative 12/24/2021 <5.0  <5 mIU/mL Final   Comment 3 12/24/2021          Final   Comment:   GEST. AGE      CONC.  (mIU/mL)   <=1 WEEK        5 - 50     2 WEEKS       50 - 500     3 WEEKS       100 - 10,000     4 WEEKS     1,000 - 30,000        FEMALE AND NON-PREGNANT  FEMALE:     LESS THAN 5 mIU/mL    Glucose-Capillary 12/24/2021 160 (H)  70 - 99 mg/dL Final   Glucose reference range applies only to samples taken after fasting for at least 8 hours.  Admission on 12/15/2021, Discharged on 12/16/2021  Component Date Value Ref Range Status   Lipase 12/15/2021 26  11 - 51 U/L Final   Performed at [facility], [place] [address] [place], [place] [address]   Sodium 12/15/2021 138  135 - 145 mmol/L Final   Potassium 12/15/2021 3.0 (L)  3.5 - 5.1 mmol/L Final   Chloride 12/15/2021 105  98 - 111 mmol/L Final   CO2 12/15/2021 24  22 - 32 mmol/L Final   Glucose, Bld 12/15/2021 152 (H)  70 - 99 mg/dL Final   Glucose  reference range applies only to samples taken after fasting for at least 8 hours.   BUN 12/15/2021 8  6 - 20 mg/dL Final   Creatinine, Ser 12/15/2021 0.81  0.44 - 1.00 mg/dL Final   Calcium 12/15/2021 9.0  8.9 - 10.3 mg/dL Final   Total Protein 12/15/2021 7.1  6.5 - 8.1 g/dL Final   Albumin 12/15/2021 3.9  3.5 - 5.0 g/dL Final   AST 12/15/2021 18  15 - 41 U/L Final   ALT 12/15/2021 17  0 - 44 U/L Final   Alkaline Phosphatase 12/15/2021 49  38 - 126 U/L Final   Total Bilirubin 12/15/2021 0.6  0.3 - 1.2 mg/dL Final   GFR, Estimated 12/15/2021 >60  >60 mL/min Final   Comment: (NOTE) Calculated using the CKD-EPI Creatinine Equation (2021)    Anion gap 12/15/2021 9  5 - 15 Final   Performed at [facility], [place] [address], [place], [place] [address]   WBC 12/15/2021 11.9 (H)  4.0 - 10.5 K/uL Final   RBC 12/15/2021 4.39  3.87 - 5.11 MIL/uL Final   Hemoglobin 12/15/2021 11.0 (L)  12.0 - 15.0 g/dL Final   HCT 12/15/2021 34.8 (L)  36.0 - 46.0 % Final   MCV 12/15/2021 79.3 (L)  80.0 - 100.0 fL Final   MCH 12/15/2021 25.1 (L)  26.0 - 34.0 pg Final   MCHC 12/15/2021 31.6  30.0 - 36.0 g/dL Final   RDW 12/15/2021 15.7 (H)  11.5 - 15.5 % Final   Platelets 12/15/2021 245  150 - 400 K/uL Final   REPEATED TO VERIFY   nRBC 12/15/2021 0.0  0.0 - 0.2 % Final   Performed at [facility], [place] [address] [place], [place] [address]   I-stat hCG, quantitative 12/15/2021 <5.0  <5 mIU/mL Final   Comment 3 12/15/2021          Final   Comment:   GEST. AGE      CONC.  (mIU/mL)   <=1 WEEK        5 - 50     2 WEEKS       50 - 500     3 WEEKS       100 - 10,000     4 WEEKS     1,000 - 30,000        FEMALE AND NON-PREGNANT FEMALE:     LESS THAN 5 mIU/mL   Admission on 12/12/2021, Discharged on 12/12/2021  Component Date Value Ref Range Status   Sodium 12/12/2021 144  135 - 145 mmol/L Final   Potassium 12/12/2021 3.7  3.5 - 5.1 mmol/L Final   Chloride 12/12/2021  112 (H)  98 - 111 mmol/L Final    CO2 12/12/2021 27  22 - 32 mmol/L Final   Glucose, Bld 12/12/2021 107 (H)  70 - 99 mg/dL Final   Glucose reference range applies only to samples taken after fasting for at least 8 hours.   BUN 12/12/2021 10  6 - 20 mg/dL Final   Creatinine, Ser 12/12/2021 0.83  0.44 - 1.00 mg/dL Final   Calcium 12/12/2021 9.5  8.9 - 10.3 mg/dL Final   GFR, Estimated 12/12/2021 >60  >60 mL/min Final   Comment: (NOTE) Calculated using the CKD-EPI Creatinine Equation (2021)    Anion gap 12/12/2021 5  5 - 15 Final   Performed at [facility], [place] [address], [place], [place] [address]   WBC 12/12/2021 12.8 (H)  4.0 - 10.5 K/uL Final   RBC 12/12/2021 4.65  3.87 - 5.11 MIL/uL Final   Hemoglobin 12/12/2021 11.5 (L)  12.0 - 15.0 g/dL Final   HCT 12/12/2021 37.6  36.0 - 46.0 % Final   MCV 12/12/2021 80.9  80.0 - 100.0 fL Final   MCH 12/12/2021 24.7 (L)  26.0 - 34.0 pg Final   MCHC 12/12/2021 30.6  30.0 - 36.0 g/dL Final   RDW 12/12/2021 15.9 (H)  11.5 - 15.5 % Final   Platelets 12/12/2021 313  150 - 400 K/uL Final   nRBC 12/12/2021 0.0  0.0 - 0.2 % Final   Neutrophils Relative % 12/12/2021 53  % Final   Neutro Abs 12/12/2021 6.9  1.7 - 7.7 K/uL Final   Lymphocytes Relative 12/12/2021 36  % Final   Lymphs Abs 12/12/2021 4.6 (H)  0.7 - 4.0 K/uL Final   Monocytes Relative 12/12/2021 6  % Final   Monocytes Absolute 12/12/2021 0.8  0.1 - 1.0 K/uL Final   Eosinophils Relative 12/12/2021 4  % Final   Eosinophils Absolute 12/12/2021 0.5  0.0 - 0.5 K/uL Final   Basophils Relative 12/12/2021 1  % Final   Basophils Absolute 12/12/2021 0.1  0.0 - 0.1 K/uL Final   Immature Granulocytes 12/12/2021 0  % Final   Abs Immature Granulocytes 12/12/2021 0.03  0.00 - 0.07 K/uL Final   Performed at [facility], [place] [address], [place], [place] [address]  Admission on 12/11/2021, Discharged on 12/11/2021  Component Date Value Ref Range Status   Sodium 12/11/2021 141  135 - 145 mmol/L Final    Potassium 12/11/2021 3.6  3.5 - 5.1 mmol/L Final   Chloride 12/11/2021 107  98 - 111 mmol/L Final   CO2 12/11/2021 27  22 - 32 mmol/L Final   Glucose, Bld 12/11/2021 184 (H)  70 - 99 mg/dL Final   Glucose reference range applies only to samples taken after fasting for at least 8 hours.   BUN 12/11/2021 13  6 - 20 mg/dL Final   Creatinine, Ser 12/11/2021 0.81  0.44 - 1.00 mg/dL Final   Calcium 12/11/2021 8.9  8.9 - 10.3 mg/dL Final   Total Protein 12/11/2021 7.9  6.5 - 8.1 g/dL Final   Albumin 12/11/2021 4.0  3.5 - 5.0 g/dL Final   AST 12/11/2021 20  15 - 41 U/L Final   ALT 12/11/2021 20  0 - 44 U/L Final   Alkaline Phosphatase 12/11/2021 53  38 - 126 U/L Final   Total Bilirubin 12/11/2021 0.6  0.3 - 1.2 mg/dL Final   GFR, Estimated 12/11/2021 >60  >60 mL/min Final   Comment: (NOTE) Calculated using the CKD-EPI  Creatinine Equation (2021)    Anion gap 12/11/2021 7  5 - 15 Final   Performed at [facility], [place] [address] [place], [place] [address]   WBC 12/11/2021 8.8  4.0 - 10.5 K/uL Final   RBC 12/11/2021 4.52  3.87 - 5.11 MIL/uL Final   Hemoglobin 12/11/2021 11.2 (L)  12.0 - 15.0 g/dL Final   HCT 12/11/2021 36.5  36.0 - 46.0 % Final   MCV 12/11/2021 80.8  80.0 - 100.0 fL Final   MCH 12/11/2021 24.8 (L)  26.0 - 34.0 pg Final   MCHC 12/11/2021 30.7  30.0 - 36.0 g/dL Final   RDW 12/11/2021 15.5  11.5 - 15.5 % Final   Platelets 12/11/2021 268  150 - 400 K/uL Final   nRBC 12/11/2021 0.0  0.0 - 0.2 % Final   Neutrophils Relative % 12/11/2021 61  % Final   Neutro Abs 12/11/2021 5.3  1.7 - 7.7 K/uL Final   Lymphocytes Relative 12/11/2021 29  % Final   Lymphs Abs 12/11/2021 2.6  0.7 - 4.0 K/uL Final   Monocytes Relative 12/11/2021 5  % Final   Monocytes Absolute 12/11/2021 0.4  0.1 - 1.0 K/uL Final   Eosinophils Relative 12/11/2021 4  % Final   Eosinophils Absolute 12/11/2021 0.4  0.0 - 0.5 K/uL Final   Basophils Relative 12/11/2021 1  % Final   Basophils  Absolute 12/11/2021 0.1  0.0 - 0.1 K/uL Final   Immature Granulocytes 12/11/2021 0  % Final   Abs Immature Granulocytes 12/11/2021 0.03  0.00 - 0.07 K/uL Final   Performed at [facility], [place] [address] [place], [place] [address]   Troponin I (High Sensitivity) 12/11/2021 3  <18 ng/L Final   Comment: (NOTE) Elevated high sensitivity troponin I (hsTnI) values and significant  changes across serial measurements may suggest ACS but many other  chronic and acute conditions are known to elevate hsTnI results.  Refer to the "Links" section for chest pain algorithms and additional  guidance. Performed at [facility], [place] [address] [place], [place] [address]    I-stat hCG, quantitative 12/11/2021 <5.0  <5 mIU/mL Final   Comment 3 12/11/2021          Final   Comment:   GEST. AGE      CONC.  (mIU/mL)   <=1 WEEK        5 - 50     2 WEEKS       50 - 500     3 WEEKS       100 - 10,000     4 WEEKS     1,000 - 30,000        FEMALE AND NON-PREGNANT FEMALE:     LESS THAN 5 mIU/mL   Admission on 11/28/2021, Discharged on 11/30/2021  Component Date Value Ref Range Status   SARS Coronavirus 2 by RT PCR 11/28/2021 NEGATIVE  NEGATIVE Final   Comment: (NOTE) SARS-CoV-2 target nucleic acids are NOT DETECTED.  The SARS-CoV-2 RNA is generally detectable in upper respiratory specimens during the acute phase of infection. The lowest concentration of SARS-CoV-2 viral copies this assay can detect is 138 copies/mL. A negative result does not preclude SARS-Cov-2 infection and should not be used as the sole basis for treatment or other patient management decisions. A negative result may occur with  improper specimen collection/handling, submission of specimen other than nasopharyngeal swab, presence of viral mutation(s) within the areas targeted by this assay,  and inadequate number of viral copies(<138 copies/mL). A negative result must be combined with clinical  observations, patient history, and epidemiological information. The expected result is Negative.  Fact Sheet for Patients:  EntrepreneurPulse.com.au  Fact Sheet for Healthcare Providers:  IncredibleEmployment.be  This test is no                          t yet approved or cleared by the [place] FDA and  has been authorized for detection and/or diagnosis of SARS-CoV-2 by FDA under an Emergency Use Authorization (EUA). This EUA will remain  in effect (meaning this test can be used) for the duration of the COVID-19 declaration under Section 564(b)(1) of the Act, 21 U.S.C.section 360bbb-3(b)(1), unless the authorization is terminated  or revoked sooner.       Influenza A by PCR 11/28/2021 NEGATIVE  NEGATIVE Final   Influenza B by PCR 11/28/2021 NEGATIVE  NEGATIVE Final   Comment: (NOTE) The Xpert Xpress SARS-CoV-2/FLU/RSV plus assay is intended as an aid in the diagnosis of influenza from Nasopharyngeal swab specimens and should not be used as a sole basis for treatment. Nasal washings and aspirates are unacceptable for Xpert Xpress SARS-CoV-2/FLU/RSV testing.  Fact Sheet for Patients: EntrepreneurPulse.com.au  Fact Sheet for Healthcare Providers: IncredibleEmployment.be  This test is not yet approved or cleared by the [place] FDA and has been authorized for detection and/or diagnosis of SARS-CoV-2 by FDA under an Emergency Use Authorization (EUA). This EUA will remain in effect (meaning this test can be used) for the duration of the COVID-19 declaration under Section 564(b)(1) of the Act, 21 U.S.C. section 360bbb-3(b)(1), unless the authorization is terminated or revoked.  Performed at [facility], [place] [address], [place], [place] [address]    Alcohol, Ethyl (B) 11/28/2021 <10  <10 mg/dL Final   Comment: (NOTE) Lowest detectable limit for serum alcohol is 10 mg/dL.  For medical  purposes only. Performed at [facility], [place] [address] [place], [place] [address]    Opiates 11/28/2021 NONE DETECTED  NONE DETECTED Final   Cocaine 11/28/2021 NONE DETECTED  NONE DETECTED Final   Benzodiazepines 11/28/2021 NONE DETECTED  NONE DETECTED Final   Amphetamines 11/28/2021 NONE DETECTED  NONE DETECTED Final   Tetrahydrocannabinol 11/28/2021 NONE DETECTED  NONE DETECTED Final   Barbiturates 11/28/2021 NONE DETECTED  NONE DETECTED Final   Comment: (NOTE) DRUG SCREEN FOR MEDICAL PURPOSES ONLY.  IF CONFIRMATION IS NEEDED FOR ANY PURPOSE, NOTIFY LAB WITHIN 5 DAYS.  LOWEST DETECTABLE LIMITS FOR URINE DRUG SCREEN Drug Class                     Cutoff (ng/mL) Amphetamine and metabolites    1000 Barbiturate and metabolites    200 Benzodiazepine                 382 Tricyclics and metabolites     300 Opiates and metabolites        300 Cocaine and metabolites        300 THC                            50 Performed at [facility], [place] [address] [place], [place] [address]    WBC 11/28/2021 14.7 (H)  4.0 - 10.5 K/uL Final   RBC 11/28/2021 4.35  3.87 - 5.11 MIL/uL Final   Hemoglobin 11/28/2021 11.0 (L)  12.0 - 15.0 g/dL Final   HCT 11/28/2021 34.9 (L)  36.0 - 46.0 % Final   MCV 11/28/2021 80.2  80.0 - 100.0 fL Final   MCH 11/28/2021 25.3 (L)  26.0 - 34.0 pg Final   MCHC 11/28/2021 31.5  30.0 - 36.0 g/dL Final   RDW 11/28/2021 15.8 (H)  11.5 - 15.5 % Final   Platelets 11/28/2021 268  150 - 400 K/uL Final   REPEATED TO VERIFY   nRBC 11/28/2021 0.0  0.0 - 0.2 % Final   Neutrophils Relative % 11/28/2021 65  % Final   Neutro Abs 11/28/2021 9.6 (H)  1.7 - 7.7 K/uL Final   Lymphocytes Relative 11/28/2021 26  % Final   Lymphs Abs 11/28/2021 3.8  0.7 - 4.0 K/uL Final   Monocytes Relative 11/28/2021 5  % Final   Monocytes Absolute 11/28/2021 0.8  0.1 - 1.0 K/uL Final   Eosinophils Relative 11/28/2021 3  % Final   Eosinophils Absolute 11/28/2021 0.4  0.0 - 0.5 K/uL  Final   Basophils Relative 11/28/2021 1  % Final   Basophils Absolute 11/28/2021 0.1  0.0 - 0.1 K/uL Final   Immature Granulocytes 11/28/2021 0  % Final   Abs Immature Granulocytes 11/28/2021 0.06  0.00 - 0.07 K/uL Final   Performed at [facility], [place] [address], [place], [place] [address]   I-stat hCG, quantitative 11/28/2021 <5.0  <5 mIU/mL Final   Comment 3 11/28/2021          Final   Comment:   GEST. AGE      CONC.  (mIU/mL)   <=1 WEEK        5 - 50     2 WEEKS       50 - 500     3 WEEKS       100 - 10,000     4 WEEKS     1,000 - 30,000        FEMALE AND NON-PREGNANT FEMALE:     LESS THAN 5 mIU/mL    Sodium 11/28/2021 139  135 - 145 mmol/L Final   Potassium 11/28/2021 3.7  3.5 - 5.1 mmol/L Final   Chloride 11/28/2021 107  98 - 111 mmol/L Final   CO2 11/28/2021 27  22 - 32 mmol/L Final   Glucose, Bld 11/28/2021 181 (H)  70 - 99 mg/dL Final   Glucose reference range applies only to samples taken after fasting for at least 8 hours.   BUN 11/28/2021 6  6 - 20 mg/dL Final   Creatinine, Ser 11/28/2021 0.90  0.44 - 1.00 mg/dL Final   Calcium 11/28/2021 9.0  8.9 - 10.3 mg/dL Final   Total Protein 11/28/2021 6.8  6.5 - 8.1 g/dL Final   Albumin 11/28/2021 3.6  3.5 - 5.0 g/dL Final   AST 11/28/2021 19  15 - 41 U/L Final   ALT 11/28/2021 18  0 - 44 U/L Final   Alkaline Phosphatase 11/28/2021 50  38 - 126 U/L Final   Total Bilirubin 11/28/2021 0.4  0.3 - 1.2 mg/dL Final   GFR, Estimated 11/28/2021 >60  >60 mL/min Final   Comment: (NOTE) Calculated using the CKD-EPI Creatinine Equation (2021)    Anion gap 11/28/2021 5  5 - 15 Final   Performed at [place] [address] [place], [place] [address]   Glucose-Capillary 11/28/2021 221 (H)  70 - 99 mg/dL Final   Glucose reference range applies only to samples taken  after fasting for at least 8 hours.   Glucose-Capillary 11/29/2021 209 (H)  70 - 99 mg/dL Final   Glucose reference range applies only to samples taken after  fasting for at least 8 hours.   Glucose-Capillary 11/29/2021 155 (H)  70 - 99 mg/dL Final   Glucose reference range applies only to samples taken after fasting for at least 8 hours.   Glucose-Capillary 11/29/2021 178 (H)  70 - 99 mg/dL Final   Glucose reference range applies only to samples taken after fasting for at least 8 hours.   Glucose-Capillary 11/29/2021 197 (H)  70 - 99 mg/dL Final   Glucose reference range applies only to samples taken after fasting for at least 8 hours.   Comment 1 11/29/2021 Notify RN   Final   Comment 2 11/29/2021 Document in Chart   Final   Glucose-Capillary 11/30/2021 196 (H)  70 - 99 mg/dL Final   Glucose reference range applies only to samples taken after fasting for at least 8 hours.   Comment 1 11/30/2021 Notify RN   Final   Comment 2 11/30/2021 Document in Chart   Final   Glucose-Capillary 11/30/2021 176 (H)  70 - 99 mg/dL Final   Glucose reference range applies only to samples taken after fasting for at least 8 hours.  Admission on 10/25/2021, Discharged on 10/27/2021  Component Date Value Ref Range Status   SARS Coronavirus 2 by RT PCR 10/25/2021 NEGATIVE  NEGATIVE Final   Comment: (NOTE) SARS-CoV-2 target nucleic acids are NOT DETECTED.  The SARS-CoV-2 RNA is generally detectable in upper respiratory specimens during the acute phase of infection. The lowest concentration of SARS-CoV-2 viral copies this assay can detect is 138 copies/mL. A negative result does not preclude SARS-Cov-2 infection and should not be used as the sole basis for treatment or other patient management decisions. A negative result may occur with  improper specimen collection/handling, submission of specimen other than nasopharyngeal swab, presence of viral mutation(s) within the areas targeted by this assay, and inadequate number of viral copies(<138 copies/mL). A negative result must be combined with clinical observations, patient history, and epidemiological information.  The expected result is Negative.  Fact Sheet for Patients:  EntrepreneurPulse.com.au  Fact Sheet for Healthcare Providers:  IncredibleEmployment.be  This test is no                          t yet approved or cleared by the [place] FDA and  has been authorized for detection and/or diagnosis of SARS-CoV-2 by FDA under an Emergency Use Authorization (EUA). This EUA will remain  in effect (meaning this test can be used) for the duration of the COVID-19 declaration under Section 564(b)(1) of the Act, 21 U.S.C.section 360bbb-3(b)(1), unless the authorization is terminated  or revoked sooner.       Influenza A by PCR 10/25/2021 NEGATIVE  NEGATIVE Final   Influenza B by PCR 10/25/2021 NEGATIVE  NEGATIVE Final   Comment: (NOTE) The Xpert Xpress SARS-CoV-2/FLU/RSV plus assay is intended as an aid in the diagnosis of influenza from Nasopharyngeal swab specimens and should not be used as a sole basis for treatment. Nasal washings and aspirates are unacceptable for Xpert Xpress SARS-CoV-2/FLU/RSV testing.  Fact Sheet for Patients: EntrepreneurPulse.com.au  Fact Sheet for Healthcare Providers: IncredibleEmployment.be  This test is not yet approved or cleared by the [place] FDA and has been authorized for detection and/or diagnosis of SARS-CoV-2 by FDA under an Emergency Use Authorization (  EUA). This EUA will remain in effect (meaning this test can be used) for the duration of the COVID-19 declaration under Section 564(b)(1) of the Act, 21 U.S.C. section 360bbb-3(b)(1), unless the authorization is terminated or revoked.  Performed at [facility], [place] [address], [place], [place] [address]    WBC 10/25/2021 14.9 (H)  4.0 - 10.5 K/uL Final   RBC 10/25/2021 4.04  3.87 - 5.11 MIL/uL Final   Hemoglobin 10/25/2021 10.0 (L)  12.0 - 15.0 g/dL Final   HCT 10/25/2021 32.6 (L)  36.0 - 46.0 % Final   MCV  10/25/2021 80.7  80.0 - 100.0 fL Final   MCH 10/25/2021 24.8 (L)  26.0 - 34.0 pg Final   MCHC 10/25/2021 30.7  30.0 - 36.0 g/dL Final   RDW 10/25/2021 15.4  11.5 - 15.5 % Final   Platelets 10/25/2021 213  150 - 400 K/uL Final   REPEATED TO VERIFY   nRBC 10/25/2021 0.0  0.0 - 0.2 % Final   Neutrophils Relative % 10/25/2021 71  % Final   Neutro Abs 10/25/2021 10.5 (H)  1.7 - 7.7 K/uL Final   Lymphocytes Relative 10/25/2021 20  % Final   Lymphs Abs 10/25/2021 3.0  0.7 - 4.0 K/uL Final   Monocytes Relative 10/25/2021 6  % Final   Monocytes Absolute 10/25/2021 0.8  0.1 - 1.0 K/uL Final   Eosinophils Relative 10/25/2021 3  % Final   Eosinophils Absolute 10/25/2021 0.5  0.0 - 0.5 K/uL Final   Basophils Relative 10/25/2021 0  % Final   Basophils Absolute 10/25/2021 0.1  0.0 - 0.1 K/uL Final   Immature Granulocytes 10/25/2021 0  % Final   Abs Immature Granulocytes 10/25/2021 0.05  0.00 - 0.07 K/uL Final   Performed at [facility], [place] [address], [place], [place] [address]   Sodium 10/25/2021 141  135 - 145 mmol/L Final   Potassium 10/25/2021 4.3  3.5 - 5.1 mmol/L Final   Chloride 10/25/2021 109  98 - 111 mmol/L Final   CO2 10/25/2021 22  22 - 32 mmol/L Final   Glucose, Bld 10/25/2021 197 (H)  70 - 99 mg/dL Final   Glucose reference range applies only to samples taken after fasting for at least 8 hours.   BUN 10/25/2021 5 (L)  6 - 20 mg/dL Final   Creatinine, Ser 10/25/2021 0.88  0.44 - 1.00 mg/dL Final   Calcium 10/25/2021 9.2  8.9 - 10.3 mg/dL Final   Total Protein 10/25/2021 6.4 (L)  6.5 - 8.1 g/dL Final   Albumin 10/25/2021 3.6  3.5 - 5.0 g/dL Final   AST 10/25/2021 18  15 - 41 U/L Final   ALT 10/25/2021 18  0 - 44 U/L Final   Alkaline Phosphatase 10/25/2021 52  38 - 126 U/L Final   Total Bilirubin 10/25/2021 0.6  0.3 - 1.2 mg/dL Final   GFR, Estimated 10/25/2021 >60  >60 mL/min Final   Comment: (NOTE) Calculated using the CKD-EPI Creatinine Equation (2021)    Anion gap  10/25/2021 10  5 - 15 Final   Performed at [facility] [address] [place], [place] [address]   Alcohol, Ethyl (B) 10/25/2021 <10  <10 mg/dL Final   Comment: (NOTE) Lowest detectable limit for serum alcohol is 10 mg/dL.  For medical purposes only. Performed at [facility], [place] [address] [place], [place] [address]    POC Amphetamine UR 10/25/2021 None Detected  NONE DETECTED (Cut Off  Level 1000 ng/mL) Preliminary   POC Secobarbital (BAR) 10/25/2021 None Detected  NONE DETECTED (Cut Off Level 300 ng/mL) Preliminary   POC Buprenorphine (BUP) 10/25/2021 None Detected  NONE DETECTED (Cut Off Level 10 ng/mL) Preliminary   POC Oxazepam (BZO) 10/25/2021 None Detected  NONE DETECTED (Cut Off Level 300 ng/mL) Preliminary   POC Cocaine UR 10/25/2021 None Detected  NONE DETECTED (Cut Off Level 300 ng/mL) Preliminary   POC Methamphetamine UR 10/25/2021 None Detected  NONE DETECTED (Cut Off Level 1000 ng/mL) Preliminary   POC Morphine 10/25/2021 None Detected  NONE DETECTED (Cut Off Level 300 ng/mL) Preliminary   POC Methadone UR 10/25/2021 None Detected  NONE DETECTED (Cut Off Level 300 ng/mL) Preliminary   POC Oxycodone UR 10/25/2021 None Detected  NONE DETECTED (Cut Off Level 100 ng/mL) Preliminary   POC Marijuana UR 10/25/2021 None Detected  NONE DETECTED (Cut Off Level 50 ng/mL) Preliminary   Lithium Lvl 10/25/2021 0.54 (L)  0.60 - 1.20 mmol/L Final   Performed at [facility], [place] [address], [place], [place] [address]   SARS Coronavirus 2 Ag 10/25/2021 Negative  Negative Final   Glucose-Capillary 10/25/2021 184 (H)  70 - 99 mg/dL Final   Glucose reference range applies only to samples taken after fasting for at least 8 hours.   Preg Test, Ur 10/25/2021 NEGATIVE  NEGATIVE Final   Comment:        THE SENSITIVITY OF THIS METHODOLOGY IS >24 mIU/mL    Glucose-Capillary 10/26/2021 136 (H)  70 - 99 mg/dL Final   Glucose reference range applies only to samples taken after  fasting for at least 8 hours.   Glucose-Capillary 10/26/2021 154 (H)  70 - 99 mg/dL Final   Glucose reference range applies only to samples taken after fasting for at least 8 hours.   Glucose-Capillary 10/27/2021 144 (H)  70 - 99 mg/dL Final   Glucose reference range applies only to samples taken after fasting for at least 8 hours.   Glucose-Capillary 10/27/2021 158 (H)  70 - 99 mg/dL Final   Glucose reference range applies only to samples taken after fasting for at least 8 hours.  Admission on 10/22/2021, Discharged on 10/22/2021  Component Date Value Ref Range Status   WBC 10/22/2021 12.7 (H)  4.0 - 10.5 K/uL Final   RBC 10/22/2021 4.71  3.87 - 5.11 MIL/uL Final   Hemoglobin 10/22/2021 11.9 (L)  12.0 - 15.0 g/dL Final   HCT 10/22/2021 38.2  36.0 - 46.0 % Final   MCV 10/22/2021 81.1  80.0 - 100.0 fL Final   MCH 10/22/2021 25.3 (L)  26.0 - 34.0 pg Final   MCHC 10/22/2021 31.2  30.0 - 36.0 g/dL Final   RDW 10/22/2021 15.4  11.5 - 15.5 % Final   Platelets 10/22/2021 279  150 - 400 K/uL Final   nRBC 10/22/2021 0.0  0.0 - 0.2 % Final   Neutrophils Relative % 10/22/2021 59  % Final   Neutro Abs 10/22/2021 7.7  1.7 - 7.7 K/uL Final   Lymphocytes Relative 10/22/2021 28  % Final   Lymphs Abs 10/22/2021 3.5  0.7 - 4.0 K/uL Final   Monocytes Relative 10/22/2021 6  % Final   Monocytes Absolute 10/22/2021 0.7  0.1 - 1.0 K/uL Final   Eosinophils Relative 10/22/2021 6  % Final   Eosinophils Absolute 10/22/2021 0.7 (H)  0.0 - 0.5 K/uL Final   Basophils Relative 10/22/2021 1  % Final   Basophils Absolute 10/22/2021 0.1  0.0 -  0.1 K/uL Final   Immature Granulocytes 10/22/2021 0  % Final   Abs Immature Granulocytes 10/22/2021 0.03  0.00 - 0.07 K/uL Final   Performed at [facility], [place] [address] [place], [place] [address]  There may be more visits with results that are not included.    Allergies: Wellbutrin [bupropion], Omnipaque [iohexol], Penicillins, Atarax [hydroxyzine],  Contrast media [iodinated contrast media], and Cymbalta [duloxetine hcl]  PTA Medications: (Not in a hospital admission)   Medical Decision Making  Patient presents to [facility] with increased depression and SI with a plan to overdose on her medications.  Her medications include lithium which can be lethal if taken in an overdose.  She has a history of SI and suicide attempts.  She cannot contract for safety and cannot be safely discharged at this time.  She meets criteria for inpatient psychiatric admission    Recommendations  Based on my evaluation the patient does not appear to have an emergency medical condition.  Patient meets criteria for inpatient psychiatric admission..  Can [facility] notified and there is no bed availability.  Social work notified.  Will restart home medications pending lab work.  Lab Orders         Resp Panel by RT-PCR (Flu A&B, Covid) Anterior Nasal Swab         CBC with Differential/Platelet         Comprehensive metabolic panel         Hemoglobin A1c         Magnesium         Ethanol         TSH         Urinalysis, Routine w reflex microscopic Urine, Clean Catch         Pregnancy, urine         Lithium level         POCT Urine Drug Screen - (I-Screen)         POC SARS Coronavirus 2 Ag-ED - Nasal Swab         POC SARS Coronavirus 2 Ag      EKG  Revonda Humphrey, NP 01/10/22  5:20 PM

## 2022-01-10 NOTE — ED Notes (Signed)
Pt is sleeping as noted by snoring but no distressed or disturbed sleep patterns noted respirations are easy at 16 bpm.

## 2022-01-10 NOTE — ED Notes (Signed)
Patient arrived on unit. Patient made phone call. Patient was given meal. Patient is safe on unit with continued monitoring.

## 2022-01-11 NOTE — ED Notes (Signed)
Patient was discharged to home by provider. Patient was given AVS with community services. Patient was given a taxi voucher for transportation to home.

## 2022-01-11 NOTE — Discharge Instructions (Addendum)
Please discuss with ACTT Team.  Lithium level on 01/10/2022 was 0.18 below the therapeutic level.  Dr. Sheppard Evens has been notified of lithium level.  The suicide prevention education provided includes the following: Suicide risk factors Suicide prevention and interventions National Suicide Hotline telephone number [facility] assessment telephone number [facility] Emergency Assistance [place] and/or Residential Mobile Crisis Unit telephone number

## 2022-01-11 NOTE — ED Notes (Signed)
Patient continues to rest with no sxs of distress noted - will continue to monitor for safety 

## 2022-01-11 NOTE — ED Provider Notes (Cosign Needed Addendum)
FBC/OBS ASAP Discharge Summary  Date and Time: 01/11/2022 10:22 PM  Name: [name]  MRN:  [id number]   Discharge Diagnoses:  Final diagnoses:  Severe episode of recurrent major depressive disorder, without psychotic features ([place])  Suicidal ideations    Subjective: [name] presented to [facility] as a walk in via [facility] with complaints of increased depression and suicidal ideations with a plan to overdose.  She was admitted to the continuous assessment unit for overnight observation.   [name], 31 y.o., female patient seen face to face by this provider, consulted with Dr. Dwyane Dee; and chart reviewed on 01/11/22.  Per chart review patient has a past psychiatric history of schizoaffective disorder bipolar type, MDD, OCD, and anxiety.  She is well-known to the psychiatric service.  She has services in place with [facility] ACTT team.  She reports compliance with her medications.  Her medications are administered in monthly packets by her ACTT team.  In addition she reports she takes the Haldol injection monthly.  On today's assessment [name] is laying in her bed in no acute distress.  She is easily awakened.  She is pleasant.  She has normal speech and makes good eye contact.  Her speech is clear, coherent, and had a normal rate and tone.  She reports she was able to get some sleep and is feeling ready to be discharged home.  Reports she still has some depression but states it is improved and she feels "good".  She is denying SI.  She is contracting for safety and states, "I promise I will not hurt myself".  She denies access to firearms/weapons.  She denies HI/AVH.  She denies paranoia or delusional thought content.  Objectively she does not appear to be responding to internal/external stimuli.  She was able to remain calm and answer questions appropriately.  She asked if her ACTT team could be contacted to request a ride home.  Collateral with patient's permission  Dr. Sheppard Evens with ACTT team.  Dr. Orpah Melter he was not at his computer and could not verify patient's medications at this time.  Reports they have talked with [name] multiple times about overusing the [facility].  Reports he is unsure what else can be done to help her not to utilize the services as frequently.  He has no immediate concerns with patient being discharged home.    Collateral [name] with ACTT team contacted.  [name] states that they will not provide a ride at this time.  Reports they will follow-up with her this week concerning her medications.  She has no immediate safety concerns with patient being discharged home via taxi.  Stay Summary:   Patient remained calm and cooperative while on the unit.  She was appropriate with staff and other patients.  She was compliant with medications.  She exhibited no unsafe behavior while on the unit.  Patient's ACTT team has been contacted and will follow-up with patient this week.  Also informed Dr. Sheppard Evens of patient's lithium level  of 0.18.  Upon completion of this admission  [name] was both mentally and medically stable for discharge denying suicidal/homicidal ideation, auditory/visual/tactile hallucinations, delusional thoughts and paranoia.     Total Time spent with patient: 30 minutes  Past Psychiatric History: See H&P Past Medical History:  Past Medical History:  Diagnosis Date   Anxiety    Bipolar 1 disorder ([place])    Cognitive deficits    Depression    Diabetes mellitus without  complication ([place])    Hypertension    Mental disorder    Mental health disorder    Obesity     Past Surgical History:  Procedure Laterality Date   CESAREAN SECTION     CESAREAN SECTION N/A 04/25/2013   Procedure: REPEAT CESAREAN SECTION;  Surgeon: [name], MD;  Location: [facility];  Service: Obstetrics;  Laterality: N/A;   MASS EXCISION N/A 06/03/2012   Procedure: EXCISION MASS;  Surgeon: [name], MD;  Location: [facility];  Service: ENT;  Laterality: N/A;  Excision uvula mass   TONSILLECTOMY N/A 06/03/2012   Procedure: TONSILLECTOMY;  Surgeon: [name], MD;  Location: [facility];  Service: ENT;  Laterality: N/A;   TONSILLECTOMY     Family History:  Family History  Problem Relation Age of Onset   Hypertension Mother    Diabetes Father    Family Psychiatric History: See H&P Social History:  Social History   Substance and Sexual Activity  Alcohol Use Not Currently     Social History   Substance and Sexual Activity  Drug Use Not Currently   Types: "Crack" cocaine, Other-see comments   Comment: Patient reports hx of smoking Crack    Social History   Socioeconomic History   Marital status: Single    Spouse name: Not on file   Number of children: Not on file   Years of education: Not on file   Highest education level: Not on file  Occupational History   Not on file  Tobacco Use   Smoking status: Every Day    Types: Cigars   Smokeless tobacco: Never   Tobacco comments:    Pt declined  Vaping Use   Vaping Use: Never used  Substance and Sexual Activity   Alcohol use: Not Currently   Drug use: Not Currently    Types: "Crack" cocaine, Other-see comments    Comment: Patient reports hx of smoking Crack   Sexual activity: Not Currently    Birth control/protection: None    Comment: occasionally  Other Topics Concern   Not on file  Social History Narrative   ** Merged History Encounter **       Social Determinants of Health   Financial Resource Strain: Not on file  Food Insecurity: Not on file  Transportation Needs: Not on file  Physical Activity: Not on file  Stress: Not on file  Social Connections: Not on file   SDOH:  SDOH Screenings   Alcohol Screen: Low Risk  (05/02/2020)  Depression (PHQ2-9): High Risk (01/10/2022)  Tobacco Use: High Risk (01/02/2022)    Tobacco Cessation:  N/A, patient does not currently use tobacco products  Current  Medications:  No current facility-administered medications for this encounter.   Current Outpatient Medications  Medication Sig Dispense Refill   benztropine (COGENTIN) 0.5 MG tablet Take 0.5 mg by mouth 2 (two) times daily.     Cholecalciferol (VITAMIN D-3 PO) Take 1 tablet by mouth daily.     gabapentin (NEURONTIN) 100 MG capsule Take 100 mg by mouth 3 (three) times daily.     haloperidol (HALDOL) 10 MG tablet Take 10 mg by mouth at bedtime. Take with '5mg'$  tablet for '15mg'$  dose.     haloperidol (HALDOL) 5 MG tablet Take 5 mg by mouth at bedtime. Take with '10mg'$  tablet for '15mg'$  dose.     haloperidol decanoate (HALDOL DECANOATE) 100 MG/ML injection Inject 150 mg into the muscle every 30 (thirty) days.     insulin  detemir (LEVEMIR) 100 UNIT/ML injection Inject 0.4 mLs (40 Units total) into the skin 2 (two) times daily. 10 mL 11   lithium carbonate (ESKALITH) 450 MG CR tablet Take 450 mg by mouth at bedtime.     metoprolol tartrate (LOPRESSOR) 25 MG tablet Take 12.5 mg by mouth 2 (two) times daily.     nystatin cream (MYCOSTATIN) Apply 1 Application topically 2 (two) times daily. Applying vaginally      PTA Medications: (Not in a hospital admission)      01/10/2022    6:13 PM 10/24/2021   11:07 AM 07/11/2020    8:18 AM  Depression screen PHQ 2/9  Decreased Interest 2 1 0  Down, Depressed, Hopeless '2 1 1  '$ PHQ - 2 Score '4 2 1  '$ Altered sleeping 2 1 0  Tired, decreased energy '1 1 1  '$ Change in appetite 2 0 0  Feeling bad or failure about yourself  2 1 0  Trouble concentrating '2 1 2  '$ Moving slowly or fidgety/restless 2 0 0  Suicidal thoughts 2 1 0  PHQ-9 Score '17 7 4  '$ Difficult doing work/chores Very difficult Somewhat difficult     Flowsheet Row [facility] from 01/06/2022 in [place] ED from 01/04/2022 in [facility] High Risk High Risk High Risk        Musculoskeletal  Strength & Muscle Tone: within normal limits Gait & Station: normal Patient leans: N/A  Psychiatric Specialty Exam  Presentation  General Appearance: Casual  Eye Contact:Good  Speech:Clear and Coherent; Normal Rate  Speech Volume:Normal  Handedness:Right   Mood and Affect  Mood:Euthymic  Affect:Congruent   Thought Process  Thought Processes:Coherent  Descriptions of Associations:Intact  Orientation:Full (Time, Place and Person)  Thought Content:Logical  Diagnosis of Schizophrenia or Schizoaffective disorder in past: Yes  Duration of Psychotic Symptoms: Greater than six months   Hallucinations:Hallucinations: None  Ideas of Reference:None  Suicidal Thoughts:Suicidal Thoughts: No SI Active Intent and/or Plan: With Intent; With Plan; With Means to Carry Out  Homicidal Thoughts:Homicidal Thoughts: No HI Passive Intent and/or Plan: Without Intent; Without Plan; Without Means to Carry Out   Sensorium  Memory:Immediate Good; Recent Good; Remote Good  Judgment:Fair  Insight:Fair   Executive Functions  Concentration:Good  Attention Span:Good  [employer]  Language:Good   Psychomotor Activity  Psychomotor Activity:Psychomotor Activity: Normal   Assets  Assets:Physical Health; Resilience; Social Support; Housing; Leisure Time   Sleep  Sleep:Sleep: Good Number of Hours of Sleep: 4   Nutritional Assessment (For OBS and FBC admissions only) Has the patient had a weight loss or gain of 10 pounds or more in the last 3 months?: No Has the patient had a decrease in food intake/or appetite?: No Does the patient have dental problems?: No Does the patient have eating habits or behaviors that may be indicators of an eating disorder including binging or inducing vomiting?: No Has the patient recently lost weight without trying?: 2.0 Has the patient been eating poorly because of a decreased appetite?:  1 Malnutrition Screening Tool Score: 3    Physical Exam  Physical Exam Vitals and nursing note reviewed.  Constitutional:      General: She is not in acute distress.    Appearance: Normal appearance. She is not ill-appearing.  HENT:     Head: Normocephalic.  Eyes:     General:  Right eye: No discharge.        Left eye: No discharge.     Conjunctiva/sclera: Conjunctivae normal.  Cardiovascular:     Rate and Rhythm: Normal rate.  Pulmonary:     Effort: Pulmonary effort is normal.  Musculoskeletal:        General: Normal range of motion.     Cervical back: Normal range of motion.  Skin:    Coloration: Skin is not jaundiced or pale.  Neurological:     Mental Status: She is alert and oriented to person, place, and time.  Psychiatric:        Attention and Perception: Attention and perception normal.        Mood and Affect: Mood and affect normal.        Speech: Speech normal.        Behavior: Behavior normal. Behavior is cooperative.        Thought Content: Thought content normal.        Cognition and Memory: Cognition normal.        Judgment: Judgment is impulsive.   Review of Systems  Constitutional: Negative.   HENT: Negative.    Eyes: Negative.   Respiratory: Negative.    Cardiovascular: Negative.   Musculoskeletal: Negative.   Skin: Negative.   Neurological: Negative.    Blood pressure 105/67, pulse 93, temperature 97.9 F (36.6 C), temperature source Oral, resp. rate 20, height '5\' 9"'$  (1.753 m), weight 210 lb (95.3 kg), last menstrual period 12/09/2021, SpO2 96 %. Body mass index is 31.01 kg/m.  Demographic Factors:  Low socioeconomic status and Living alone  Loss Factors: NA  Historical Factors: Prior suicide attempts and Impulsivity  Risk Reduction Factors:   Sense of responsibility to family, Positive social support, Positive therapeutic relationship, and Positive coping skills or problem solving skills  Continued Clinical Symptoms:  Severe  Anxiety and/or Agitation Depression:   Impulsivity Schizophrenia:   Depressive state More than one psychiatric diagnosis Previous Psychiatric Diagnoses and Treatments  Cognitive Features That Contribute To Risk:  None    Suicide Risk:  Minimal: No identifiable suicidal ideation.  Patients presenting with no risk factors but with morbid ruminations; may be classified as minimal risk based on the severity of the depressive symptoms  Plan Of Care/Follow-up recommendations:  Activity:  as tolerated Diet:  regular  Disposition: Discharge patient  ACTT notified and will follow up with patient this week  No evidence of imminent risk to self or others at present.    Patient does not meet criteria for psychiatric inpatient admission. Discussed crisis plan, support from social network, calling 911, coming to the Emergency Department, and calling Suicide Hotline.   Revonda Humphrey, NP 01/11/2022, 10:22 PM

## 2022-01-11 NOTE — ED Notes (Signed)
Patient denies SI/HI and AVH. Patient has been calm and cooperative on the unit. Patient refused the Buspar reported it makes her "act out". Patient received other medications and ate breakfast and is resting.

## 2022-01-11 NOTE — ED Notes (Signed)
Patient is sleeping no problems indicated.

## 2022-01-22 ENCOUNTER — Emergency Department (HOSPITAL_COMMUNITY)
Admission: EM | Admit: 2022-01-22 | Discharge: 2022-01-22 | Disposition: A | Discharge status: Home / Self Care | Payer: Medicaid Other | Attending: Emergency Medicine | Admitting: Emergency Medicine

## 2022-01-22 ENCOUNTER — Encounter (HOSPITAL_COMMUNITY): Payer: Self-pay | Admitting: Pharmacy Technician

## 2022-01-22 ENCOUNTER — Other Ambulatory Visit: Payer: Self-pay

## 2022-01-22 DIAGNOSIS — R000 Tachycardia, unspecified: Secondary | ICD-10-CM | POA: Diagnosis not present

## 2022-01-22 DIAGNOSIS — F32A Depression, unspecified: Secondary | ICD-10-CM | POA: Insufficient documentation

## 2022-01-22 DIAGNOSIS — F250 Schizoaffective disorder, bipolar type: Secondary | ICD-10-CM | POA: Diagnosis present

## 2022-01-22 DIAGNOSIS — Z794 Long term (current) use of insulin: Secondary | ICD-10-CM | POA: Insufficient documentation

## 2022-01-22 DIAGNOSIS — Z79899 Other long term (current) drug therapy: Secondary | ICD-10-CM | POA: Insufficient documentation

## 2022-01-22 DIAGNOSIS — Z20822 Contact with and (suspected) exposure to covid-19: Secondary | ICD-10-CM | POA: Insufficient documentation

## 2022-01-22 LAB — CBC WITH DIFFERENTIAL/PLATELET
Abs Immature Granulocytes: 0.05 10*3/uL (ref 0.00–0.07)
Basophils Absolute: 0.1 10*3/uL (ref 0.0–0.1)
Basophils Relative: 1 %
Eosinophils Absolute: 0.4 10*3/uL (ref 0.0–0.5)
Eosinophils Relative: 3 %
HCT: 36.3 % (ref 36.0–46.0)
Hemoglobin: 11.3 g/dL — ABNORMAL LOW (ref 12.0–15.0)
Immature Granulocytes: 0 %
Lymphocytes Relative: 33 %
Lymphs Abs: 3.9 10*3/uL (ref 0.7–4.0)
MCH: 25.1 pg — ABNORMAL LOW (ref 26.0–34.0)
MCHC: 31.1 g/dL (ref 30.0–36.0)
MCV: 80.5 fL (ref 80.0–100.0)
Monocytes Absolute: 0.8 10*3/uL (ref 0.1–1.0)
Monocytes Relative: 7 %
Neutro Abs: 6.5 10*3/uL (ref 1.7–7.7)
Neutrophils Relative %: 56 %
Platelets: 263 10*3/uL (ref 150–400)
RBC: 4.51 MIL/uL (ref 3.87–5.11)
RDW: 16.3 % — ABNORMAL HIGH (ref 11.5–15.5)
WBC: 11.6 10*3/uL — ABNORMAL HIGH (ref 4.0–10.5)
nRBC: 0 % (ref 0.0–0.2)

## 2022-01-22 LAB — COMPREHENSIVE METABOLIC PANEL
ALT: 24 U/L (ref 0–44)
AST: 42 U/L — ABNORMAL HIGH (ref 15–41)
Albumin: 4.1 g/dL (ref 3.5–5.0)
Alkaline Phosphatase: 53 U/L (ref 38–126)
Anion gap: 7 (ref 5–15)
BUN: 14 mg/dL (ref 6–20)
CO2: 23 mmol/L (ref 22–32)
Calcium: 9.4 mg/dL (ref 8.9–10.3)
Chloride: 109 mmol/L (ref 98–111)
Creatinine, Ser: 0.88 mg/dL (ref 0.44–1.00)
GFR, Estimated: >60 mL/min (ref >60)
Glucose, Bld: 98 mg/dL (ref 70–99)
Potassium: 3.5 mmol/L (ref 3.5–5.1)
Sodium: 139 mmol/L (ref 135–145)
Total Bilirubin: 0.4 mg/dL (ref 0.3–1.2)
Total Protein: 7.5 g/dL (ref 6.5–8.1)

## 2022-01-22 LAB — RESP PANEL BY RT-PCR (FLU A&B, COVID) ARPGX2
Influenza A by PCR: NEGATIVE
Influenza B by PCR: NEGATIVE
SARS Coronavirus 2 by RT PCR: NEGATIVE

## 2022-01-22 LAB — SALICYLATE LEVEL: Salicylate Lvl: <7 mg/dL — ABNORMAL LOW (ref 7.0–30.0)

## 2022-01-22 LAB — ACETAMINOPHEN LEVEL: Acetaminophen (Tylenol), Serum: <10 ug/mL — ABNORMAL LOW (ref 10–30)

## 2022-01-22 LAB — PREGNANCY, URINE: Preg Test, Ur: NEGATIVE

## 2022-01-22 LAB — CBG MONITORING, ED: Glucose-Capillary: 122 mg/dL — ABNORMAL HIGH (ref 70–99)

## 2022-01-22 LAB — RAPID URINE DRUG SCREEN, HOSP PERFORMED
Amphetamines: NOT DETECTED
Barbiturates: NOT DETECTED
Benzodiazepines: NOT DETECTED
Cocaine: NOT DETECTED
Opiates: NOT DETECTED
Tetrahydrocannabinol: NOT DETECTED

## 2022-01-22 LAB — LITHIUM LEVEL: Lithium Lvl: 0.2 mmol/L — ABNORMAL LOW (ref 0.60–1.20)

## 2022-01-22 LAB — ETHANOL: Alcohol, Ethyl (B): <10 mg/dL (ref <10)

## 2022-01-22 NOTE — Discharge Summary (Signed)
[facility] ED Discharge  01/22/2022 7:46 PM [name]  MRN:  [id number]  Principal Problem: <principal problem not specified> Discharge Diagnoses: Active Problems:   Schizoaffective disorder, bipolar type ([place])  Clinical Impression:  Final diagnoses:  Schizoaffective disorder, bipolar type ([place])   Subjective: [name] is a 31 y.o. female with a history of schizoaffective disorder-bipolar type who presents to [facility] after taking 2 extra haldol. She is well known to this provider, behavioral health services, and area emergency departments.  Patient evaluated and discussed with Dr. Hampton Abbot.  Patient reports that this morning she was feeling irritable and she took 2 extra Haldol.  She denies that this was a suicide attempt.  She is now requesting discharge home.  She states that she has transportation home if discharged. She has services in place with [facility].  She reports compliance with her medications.  She states that she last met with her ACT team five days ago.   On evaluation, patient is alert and oriented x 4.  She is neatly groomed.  Eye contact is good.  She is pleasant and cooperative.  She is smiling and laughing appropriately.  She reports her mood is euthymic.  Affect is congruent with mood.  Thought process is coherent.  Thought content is logical.  Patient denies auditory and visual hallucinations.  No indication that she is responding to internal stimuli.  No delusions elicited during this assessment.  However, on chart review it is noted that the patient mentioned witchcraft and demons to the ED provider.  This is baseline thought content for [name].  She denies suicidal ideations.  She denies homicidal ideations.  She denies substance abuse.  UDS negative.  BAL less than 10.   ED Assessment Time Calculation: Start Time: 1915 Stop Time: 1930 Total Time in Minutes (Assessment Completion): 15   Past Psychiatric History:  Schizophrenia,  major depressive disorder, generalized anxiety disorder  Past Medical History:  Past Medical History:  Diagnosis Date   Anxiety    Bipolar 1 disorder ([place])    Cognitive deficits    Depression    Diabetes mellitus without complication ([place])    Hypertension    Mental disorder    Mental health disorder    Obesity     Past Surgical History:  Procedure Laterality Date   CESAREAN SECTION     CESAREAN SECTION N/A 04/25/2013   Procedure: REPEAT CESAREAN SECTION;  Surgeon: [name], MD;  Location: [facility];  Service: Obstetrics;  Laterality: N/A;   MASS EXCISION N/A 06/03/2012   Procedure: EXCISION MASS;  Surgeon: [name], MD;  Location: [place];  Service: ENT;  Laterality: N/A;  Excision uvula mass   TONSILLECTOMY N/A 06/03/2012   Procedure: TONSILLECTOMY;  Surgeon: [name], MD;  Location: [place];  Service: ENT;  Laterality: N/A;   TONSILLECTOMY     Family History:  Family History  Problem Relation Age of Onset   Hypertension Mother    Diabetes Father     Social History:  Social History   Substance and Sexual Activity  Alcohol Use Not Currently     Social History   Substance and Sexual Activity  Drug Use Not Currently   Types: "Crack" cocaine, Other-see comments   Comment: Patient reports hx of smoking Crack    Social History   Socioeconomic History   Marital status: Single    Spouse name: Not on file   Number of children: Not on  file   Years of education: Not on file   Highest education level: Not on file  Occupational History   Not on file  Tobacco Use   Smoking status: Every Day    Types: Cigars   Smokeless tobacco: Never   Tobacco comments:    Pt declined  Vaping Use   Vaping Use: Never used  Substance and Sexual Activity   Alcohol use: Not Currently   Drug use: Not Currently    Types: "Crack" cocaine, Other-see comments    Comment: Patient reports hx of smoking Crack   Sexual activity: Not Currently     Birth control/protection: None    Comment: occasionally  Other Topics Concern   Not on file  Social History Narrative   ** Merged History Encounter **       Social Determinants of Health   Financial Resource Strain: Not on file  Food Insecurity: Not on file  Transportation Needs: Not on file  Physical Activity: Not on file  Stress: Not on file  Social Connections: Not on file    Tobacco Cessation:  A prescription for an FDA-approved tobacco cessation medication was offered at discharge and the patient refused  Current Medications: No current facility-administered medications for this encounter.   Current Outpatient Medications  Medication Sig Dispense Refill   benztropine (COGENTIN) 0.5 MG tablet Take 0.5 mg by mouth 2 (two) times daily.     Cholecalciferol (VITAMIN D-3 PO) Take 1 tablet by mouth daily.     gabapentin (NEURONTIN) 100 MG capsule Take 100 mg by mouth 3 (three) times daily.     haloperidol (HALDOL) 10 MG tablet Take 10 mg by mouth at bedtime. Take with 55m tablet for 134mdose.     haloperidol (HALDOL) 5 MG tablet Take 5 mg by mouth at bedtime. Take with 1042mablet for 17m20mse.     haloperidol decanoate (HALDOL DECANOATE) 100 MG/ML injection Inject 150 mg into the muscle every 30 (thirty) days.     insulin detemir (LEVEMIR) 100 UNIT/ML injection Inject 0.4 mLs (40 Units total) into the skin 2 (two) times daily. 10 mL 11   lithium carbonate (ESKALITH) 450 MG CR tablet Take 450 mg by mouth at bedtime.     metoprolol tartrate (LOPRESSOR) 25 MG tablet Take 12.5 mg by mouth 2 (two) times daily.     nystatin cream (MYCOSTATIN) Apply 1 Application topically 2 (two) times daily. Applying vaginally     PTA Medications: (Not in a hospital admission)   [facility] High Risk High Risk       Musculoskeletal: Strength & Muscle Tone: within normal limits Gait & Station: normal Patient leans: N/A  Psychiatric Specialty Exam: Presentation  General Appearance: Appropriate for Environment; Well Groomed  Eye Contact:Good  Speech:Clear and Coherent; Normal Rate  Speech Volume:Normal  Handedness:Right   Mood and Affect  Mood:Euthymic  Affect:Congruent; Appropriate   Thought Process  Thought Processes:Coherent; Goal Directed; Linear  Descriptions of Associations:Intact  Orientation:Full (Time, Place and Person)  Thought Content:Logical  History of Schizophrenia/Schizoaffective disorder:Yes  Duration of Psychotic Symptoms:Greater than six months  Hallucinations:Hallucinations: None  Ideas of Reference:None  Suicidal Thoughts:Suicidal Thoughts: No  Homicidal Thoughts:Homicidal Thoughts: No   Sensorium  Memory:Immediate Good; Recent Good; Remote Good  Judgment:Fair  Insight:Fair  Executive Functions  Concentration:Good  Attention Span:Good  [place]ynn of Knowledge:Good  Language:Good   Psychomotor Activity  Psychomotor Activity:Psychomotor Activity: Normal  Assets  Assets:Communication Skills; Desire for Improvement; Physical Health; Social Support; Catering manager   Sleep  Sleep:Sleep: Good   Physical Exam: Physical Exam Constitutional:      General: She is not in acute distress.    Appearance: She is not ill-appearing, toxic-appearing or diaphoretic.  Eyes:     General:        Right eye: No discharge.        Left eye: No discharge.  Cardiovascular:     Rate and Rhythm: Normal rate.  Pulmonary:     Effort: Pulmonary effort is normal. No respiratory distress.  Musculoskeletal:        General: Normal range of motion.     Cervical back: Normal range of motion.  Neurological:     Mental Status: She is alert and oriented  to person, place, and time.  Psychiatric:        Mood and Affect: Mood normal.        Speech: Speech normal.        Behavior: Behavior is cooperative.        Thought Content: Thought content is not paranoid or delusional. Thought content does not include homicidal or suicidal ideation.    Review of Systems  Respiratory:  Negative for cough and shortness of breath.   Cardiovascular:  Negative for chest pain.  Gastrointestinal:  Negative for diarrhea, nausea and vomiting.  Psychiatric/Behavioral:  Negative for depression, hallucinations and suicidal ideas. The patient is not nervous/anxious.    Blood pressure 139/87, pulse (!) 101, temperature 98.8 F (37.1 C), resp. rate 18, SpO2 97 %. There is no height or weight on file to calculate BMI.   Demographic Factors:  Low socioeconomic status, Living alone, and Unemployed  Loss Factors: NA  Historical Factors: Prior suicide attempts and Family history of mental illness or substance abuse  Risk Reduction Factors:   Religious beliefs about death, Positive social support, and Positive therapeutic relationship  Continued Clinical Symptoms:  Previous Psychiatric Diagnoses and Treatments  Cognitive Features That Contribute To Risk:  None    Suicide Risk:  Mild:  Suicidal ideation of limited frequency, intensity, duration, and specificity.  There are no identifiable plans, no associated intent, mild dysphoria and related symptoms, good self-control (both objective and subjective assessment), few other risk factors, and identifiable protective factors, including available and accessible social support.   Medical Decision Making: [name] is a 31 y.o. female with a history of schizoaffective disorder-bipolar type who presents to [facility] after taking 2 extra haldol. She is well known to this provider, behavioral health services, and area emergency departments.  At time of discharge, patient denies SI, HI, AVH and is able to contract  for safety. She demonstrated no overt evidence of psychosis or mania. She did briefly mention witchcraft and demons to the ED provider, this is baseline thought content for [name]. Prior to discharge, [name] verbalized that she the understood warning signs, triggers, and symptoms of worsening mental health and how to access emergency mental health care if she felt it was needed. Patient was instructed to call 911 or return to the emergency room if they experienced any concerning symptoms after discharge. Patient voiced understanding and agreed to this.   Problem 1: Schizoaffective Disorder   Disposition: No evidence of imminent risk to self or others at present.   Patient does  not meet criteria for psychiatric inpatient admission. Supportive therapy provided about ongoing stressors. Discussed crisis plan, support from social network, calling 911, coming to the Emergency Department, and calling Suicide Hotline.   Rozetta Nunnery, NP 01/22/2022, 7:46 PM

## 2022-01-22 NOTE — ED Notes (Signed)
2 bags placed in cabinet for [name]. Pt wanded by security.

## 2022-01-22 NOTE — ED Notes (Signed)
Psych provider at bedside speaking with pt.

## 2022-01-22 NOTE — ED Notes (Signed)
Pt refusing EKG at this time

## 2022-01-22 NOTE — ED Notes (Signed)
Pt on the phone telling the other person how she overdosed, but she knows she did not take enough to hurt herself.

## 2022-01-22 NOTE — ED Triage Notes (Signed)
Pt bib ems from home after she states she took 2 haldol back to back because it helps with her condition. Pt talking about demons and witch craft. EMS reports pt talking about SI.  VSS with ems.

## 2022-01-22 NOTE — ED Provider Notes (Signed)
[facility] DEPT Provider Note   CSN: [id number] Arrival date & time: 01/22/22  1647     History  Chief Complaint  Patient presents with   Psychiatric Evaluation    [name] is a 31 y.o. female who presents emergency department for depression.  She reports that she felt like killing herself this morning so she took 2 Haldol, 2 Cogentin this morning.  She is unsure of what time.  Patient states she has been feeling bad for the past several days.  She states that she knows that "demon" witchcraft are real."  She states that she is feeling suicidal.  She denies homicidal ideation.  HPI     Home Medications Prior to Admission medications   Medication Sig Start Date End Date Taking? Authorizing Provider  benztropine (COGENTIN) 0.5 MG tablet Take 0.5 mg by mouth 2 (two) times daily.    [provider]  Cholecalciferol (VITAMIN D-3 PO) Take 1 tablet by mouth daily.    [provider]  gabapentin (NEURONTIN) 100 MG capsule Take 100 mg by mouth 3 (three) times daily.    [provider]  haloperidol (HALDOL) 10 MG tablet Take 10 mg by mouth at bedtime. Take with '5mg'$  tablet for '15mg'$  dose.    [provider]  haloperidol (HALDOL) 5 MG tablet Take 5 mg by mouth at bedtime. Take with '10mg'$  tablet for '15mg'$  dose.    [provider]  haloperidol decanoate (HALDOL DECANOATE) 100 MG/ML injection Inject 150 mg into the muscle every 30 (thirty) days.    [provider]  insulin detemir (LEVEMIR) 100 UNIT/ML injection Inject 0.4 mLs (40 Units total) into the skin 2 (two) times daily. 10/14/21   Delfin Gant, NP  lithium carbonate (ESKALITH) 450 MG CR tablet Take 450 mg by mouth at bedtime.    [provider]  metoprolol tartrate (LOPRESSOR) 25 MG tablet Take 12.5 mg by mouth 2 (two) times daily.    [provider]  nystatin cream (MYCOSTATIN) Apply 1 Application topically 2 (two) times daily.  Applying vaginally 01/07/22   [provider]      Allergies    Wellbutrin [bupropion], Omnipaque [iohexol], Penicillins, Atarax [hydroxyzine], Contrast media [iodinated contrast media], and Cymbalta [duloxetine hcl]    Review of Systems   Review of Systems  Physical Exam Updated Vital Signs BP (!) 148/97   Pulse (!) 110   Temp 98.8 F (37.1 C)   Resp 17   SpO2 98%  Physical Exam Vitals and nursing note reviewed.  Constitutional:      General: She is not in acute distress.    Appearance: She is well-developed. She is not diaphoretic.  HENT:     Head: Normocephalic and atraumatic.     Right Ear: External ear normal.     Left Ear: External ear normal.     Nose: Nose normal.     Mouth/Throat:     Mouth: Mucous membranes are moist.  Eyes:     General: No scleral icterus.    Conjunctiva/sclera: Conjunctivae normal.  Cardiovascular:     Rate and Rhythm: Regular rhythm. Tachycardia present.     Heart sounds: Normal heart sounds. No murmur heard.    No friction rub. No gallop.  Pulmonary:     Effort: Pulmonary effort is normal. No respiratory distress.     Breath sounds: Normal breath sounds.  Abdominal:     General: Bowel sounds are normal. There is no distension.  Palpations: Abdomen is soft. There is no mass.     Tenderness: There is no abdominal tenderness. There is no guarding.  Musculoskeletal:     Cervical back: Normal range of motion.  Skin:    General: Skin is warm and dry.  Neurological:     Mental Status: She is alert and oriented to person, place, and time.  Psychiatric:        Behavior: Behavior normal.     ED Results / Procedures / Treatments   Labs (all labs ordered are listed, but only abnormal results are displayed) Labs Reviewed  COMPREHENSIVE METABOLIC PANEL  SALICYLATE LEVEL  ACETAMINOPHEN LEVEL  ETHANOL  RAPID URINE DRUG SCREEN, HOSP PERFORMED  CBC WITH DIFFERENTIAL/PLATELET  LITHIUM LEVEL  CBG MONITORING, ED  I-STAT BETA HCG  BLOOD, ED ([facility], [facility], [facility] ONLY)    EKG None  Radiology No results found.  Procedures Procedures    Medications Ordered in ED Medications - No data to display  ED Course/ Medical Decision Making/ A&P                           Medical Decision Making Amount and/or Complexity of Data Reviewed Labs: ordered.   31 y/o F well known to this ed. Patient states she took medication to harm herself, but her dose is non lethal. I ordered and reviewed her labs, no acute findings. Medically cleared and cleared by Psych. Will discharge to f/u op.        Final Clinical Impression(s) / ED Diagnoses Final diagnoses:  None    Rx / DC Orders ED Discharge Orders     None         Margarita Mail, PA-C 01/22/22 2307    Valarie Merino, MD 01/23/22 [phone]

## 2022-01-22 NOTE — ED Notes (Signed)
Pt ambulatory to restroom to change into burgundy scrubs.

## 2022-01-22 NOTE — ED Notes (Signed)
Pt given clothing upon notification of discharge. Pt changed into clothing and left prior to receiving discharge instructions, states she had to catch a ride.  Pt A&Ox4, denies SI, NAD noted, ambulatory w/o difficulty to discharge area.

## 2022-01-22 NOTE — Discharge Instructions (Signed)
  Discharge recommendations:  Patient is to take medications as prescribed. Please see information for follow-up appointment with psychiatry and therapy. Please follow up with your primary care provider for all medical related needs.   Therapy: We recommend that patient participate in individual therapy to address mental health concerns.  Medications: The patient is to contact a medical professional and/or outpatient provider to address any new side effects that develop. Patient should update outpatient providers of any new medications and/or medication changes.   Atypical antipsychotics: If you are prescribed an atypical antipsychotic, it is recommended that your height, weight, BMI, blood pressure, fasting lipid panel, and fasting blood sugar be monitored by your outpatient providers.  Safety:  The patient should abstain from use of illicit substances/drugs and abuse of any medications. If symptoms worsen or do not continue to improve or if the patient becomes actively suicidal or homicidal then it is recommended that the patient return to the closest hospital emergency department, the [facility], or call 911 for further evaluation and treatment. National Suicide Prevention Lifeline 1-800-SUICIDE or 440-217-8800.  About 988 988 offers 24/7 access to trained crisis counselors who can help people experiencing mental health-related distress. People can call or text 988 or chat 988lifeline.org for themselves or if they are worried about a loved one who may need crisis support.  Crisis Mobile: [facility]:                     (337)326-2580 (for crisis response 24 hours a day) [place]:                                            [phone]

## 2022-01-30 ENCOUNTER — Emergency Department (HOSPITAL_COMMUNITY)
Admission: EM | Admit: 2022-01-30 | Discharge: 2022-01-30 | Discharge status: Against Medical Advice | Payer: Medicaid Other | Attending: Emergency Medicine | Admitting: Emergency Medicine

## 2022-01-30 ENCOUNTER — Encounter (HOSPITAL_COMMUNITY): Payer: Self-pay | Admitting: Emergency Medicine

## 2022-01-30 DIAGNOSIS — L292 Pruritus vulvae: Secondary | ICD-10-CM | POA: Insufficient documentation

## 2022-01-30 DIAGNOSIS — Z5321 Procedure and treatment not carried out due to patient leaving prior to being seen by health care provider: Secondary | ICD-10-CM | POA: Diagnosis not present

## 2022-01-30 NOTE — ED Provider Triage Note (Signed)
Emergency Medicine Provider Triage Evaluation Note  Davinity Fanara , a 31 y.o. female  was evaluated in triage.  Pt complains of vaginal itching/burning.  Recently treated for yeast infection.  On menstrual cycle now..  Review of Systems  Positive: Vaginal symptoms Negative: Fever, abd pain  Physical Exam  BP (!) 145/99 (BP Location: Left Arm)   Pulse (!) 104   Temp 98.7 F (37.1 C) (Oral)   Resp 20   Ht '5\' 9"'$  (1.753 m)   Wt 95.3 kg   SpO2 97%   BMI 31.01 kg/m  Gen:   Awake, no distress    Resp:  Normal effort   MSK:   Moves extremities without difficulty   Other:     Medical Decision Making  Medically screening exam initiated at 4:33 PM.  Appropriate orders placed.  Sherry Thao Bauza was informed that the remainder of the evaluation will be completed by another provider, this initial triage assessment does not replace that evaluation, and the importance of remaining in the ED until their evaluation is complete.      Malvin Johns, MD 01/30/22 [phone]

## 2022-01-30 NOTE — ED Triage Notes (Signed)
Patient c/o vaginal burning and itching x1 week. Denies discharge.

## 2022-02-11 ENCOUNTER — Encounter (HOSPITAL_COMMUNITY): Payer: Self-pay | Admitting: Emergency Medicine

## 2022-02-11 ENCOUNTER — Other Ambulatory Visit: Payer: Self-pay

## 2022-02-11 ENCOUNTER — Emergency Department (HOSPITAL_COMMUNITY)
Admission: EM | Admit: 2022-02-11 | Discharge: 2022-02-11 | Discharge status: Against Medical Advice | Payer: Medicaid Other | Attending: Emergency Medicine | Admitting: Emergency Medicine

## 2022-02-11 ENCOUNTER — Encounter (HOSPITAL_COMMUNITY): Payer: Self-pay

## 2022-02-11 ENCOUNTER — Ambulatory Visit (HOSPITAL_COMMUNITY)
Admission: EM | Admit: 2022-02-11 | Discharge: 2022-02-11 | Disposition: A | Discharge status: Home / Self Care | Payer: Medicaid Other | Attending: Family Medicine | Admitting: Family Medicine

## 2022-02-11 DIAGNOSIS — U071 COVID-19: Secondary | ICD-10-CM

## 2022-02-11 DIAGNOSIS — Z5321 Procedure and treatment not carried out due to patient leaving prior to being seen by health care provider: Secondary | ICD-10-CM | POA: Insufficient documentation

## 2022-02-11 DIAGNOSIS — N898 Other specified noninflammatory disorders of vagina: Secondary | ICD-10-CM | POA: Insufficient documentation

## 2022-02-11 DIAGNOSIS — J029 Acute pharyngitis, unspecified: Secondary | ICD-10-CM | POA: Diagnosis present

## 2022-02-11 LAB — GROUP A STREP BY PCR: Group A Strep by PCR: NOT DETECTED

## 2022-02-11 LAB — SARS CORONAVIRUS 2 BY RT PCR: SARS Coronavirus 2 by RT PCR: POSITIVE — AB

## 2022-02-11 MED ORDER — PROMETHAZINE-DM 6.25-15 MG/5ML PO SYRP: 5.0000 mL | ORAL_SOLUTION | Freq: Four times a day (QID) | ORAL | 0 refills | Status: DC | PRN

## 2022-02-11 MED ORDER — NYSTATIN 100000 UNIT/ML MT SUSP: 15.0000 mL | Freq: Four times a day (QID) | OROMUCOSAL | 0 refills | Status: DC | PRN

## 2022-02-11 NOTE — ED Triage Notes (Signed)
Pt reports a sore throat and nasal congestion. Requesting a covid test.  Pt was at [facility] ED today and LWBS after triage. Strep was neg and covid swab in process.

## 2022-02-11 NOTE — ED Triage Notes (Signed)
Patient BIB [facility] from home. Has had a sore throat and congestion for 3 days. Patient has had vaginal discharge and thinks it is a yeast infection. This has been going on for a month.

## 2022-02-11 NOTE — Medical Student Note (Signed)
[facility] Statistician Note For educational purposes for Medical, PA and NP students only and not part of the legal medical record.   CSN: [id number] Arrival date & time: 02/11/22  1317      History   Chief Complaint Chief Complaint  Patient presents with   Nasal Congestion   Sore Throat    HPI [name] is a 31 y.o. female.  Remy presents today with concern after having had a positive COVID test performed today at [facility] Emergency Department. She states she has had a minor cough, nasal congestion, sore throat, and nausea persisting over the last four days that prompted her to come get tested. Patient inquires about the possibility of starting Paxlovid. Patient also describes concern for potential thrush on her tongue. States she noticed a white residue appearing around the periphery of her tongue since becoming sick and says this feels like the last time she was diagnosed with thrush. Describes a nonproductive, dry cough that keeps her awake at night, clear nasal secretions. Denies fever, ear pain, eye irritation/drainage, chest pain, SOB, abdominal pain, vomiting, diarrhea, dysuria, changes to bowel/bladder habits, numbness, tingling, thoughts of wanting to hurt self/others.    Sore Throat    Past Medical History:  Diagnosis Date   Anxiety    Bipolar 1 disorder ([place])    Cognitive deficits    Depression    Diabetes mellitus without complication ([place])    Hypertension    Mental disorder    Mental health disorder    Obesity     Patient Active Problem List   Diagnosis Date Noted   Suicidal ideations 01/04/2022   Suicidal overdose ([place]) 03/23/2021   Syphilis 07/15/2020   Malingering 06/05/2020   Gastroesophageal reflux disease 05/04/2020   Hyperglycemia due to type 2 diabetes mellitus ([place]) 05/04/2020   Long term (current) use of insulin ([place]) 05/04/2020   Migraine without aura 05/04/2020   Morbid obesity ([place]) 05/04/2020    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 2 diabetes mellitus ([place]) 05/04/2020   Prolapsed internal hemorrhoids 05/04/2020   Vitamin D deficiency 05/04/2020   Other symptoms and signs involving cognitive functions and awareness 05/04/2020   Suicide attempt ([place])    Anxiety state 03/06/2020   Schizophrenia ([place]) 09/13/2019   Bipolar I disorder, most recent episode depressed ([place]) 06/23/2019   MDD (major depressive disorder) 10/10/2018   Schizoaffective disorder, bipolar type ([place]) 09/25/2018   Affective psychosis, bipolar ([place]) 06/13/2018   HTN (hypertension) 05/03/2018   Tobacco use disorder 05/03/2018   Adjustment disorder with emotional disturbance 01/02/2018   Schizophrenia, 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   Moderate bipolar I disorder, most recent episode depressed ([place])    Schizoaffective disorder ([place])    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/2017   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   OCD (obsessive compulsive disorder) 10/05/2016   Major depressive disorder, recurrent episode, mild ([place]) 05/04/2016   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   Learning disability 07/18/2015   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/2015   Diabetes mellitus ([place]) 07/18/2015   MDD (major depressive disorder), recurrent, severe, 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   Hyperlipidemia 07/18/2015   Severe episode of recurrent major depressive disorder, without psychotic features ([place])    Suicidal ideation    Intentional overdose ([place])    Cognitive deficits 10/12/2012   Generalized anxiety disorder 06/28/2012    Past Surgical History:  Procedure Laterality Date   CESAREAN SECTION     CESAREAN SECTION N/A 04/25/2013   Procedure: REPEAT  CESAREAN SECTION;  Surgeon: [name], MD;  Location: [facility];  Service: Obstetrics;  Laterality: N/A;   MASS EXCISION N/A 06/03/2012   Procedure: EXCISION MASS;  Surgeon: [name], MD;  Location: [place];  Service: ENT;  Laterality:  N/A;  Excision uvula mass   TONSILLECTOMY N/A 06/03/2012   Procedure: TONSILLECTOMY;  Surgeon: [name], MD;  Location: [place];  Service: ENT;  Laterality: N/A;   TONSILLECTOMY      OB History     Gravida  3   Para  3   Term  3   Preterm  0   AB  0   Living  3      SAB  0   IAB  0   Ectopic  0   Multiple      Live Births  3            Home Medications    Prior to Admission medications   Medication Sig Start Date End Date Taking? Authorizing Provider  magic mouthwash (nystatin, lidocaine, diphenhydrAMINE) suspension Take 15 mLs by mouth 4 (four) times daily as needed for mouth pain (gargle and spit). 02/11/22  Yes Vanessa Kick, MD  promethazine-dextromethorphan (PROMETHAZINE-DM) 6.25-15 MG/5ML syrup Take 5 mLs by mouth 4 (four) times daily as needed for cough. 02/11/22  Yes Hagler, Aaron Edelman, MD  benztropine (COGENTIN) 0.5 MG tablet Take 0.5 mg by mouth 2 (two) times daily.    [provider]  Cholecalciferol (VITAMIN D-3 PO) Take 1 tablet by mouth daily.    [provider]  gabapentin (NEURONTIN) 100 MG capsule Take 100 mg by mouth 3 (three) times daily.    [provider]  haloperidol (HALDOL) 10 MG tablet Take 10 mg by mouth at bedtime. Take with '5mg'$  tablet for '15mg'$  dose.    [provider]  haloperidol (HALDOL) 5 MG tablet Take 5 mg by mouth at bedtime. Take with '10mg'$  tablet for '15mg'$  dose.    [provider]  haloperidol decanoate (HALDOL DECANOATE) 100 MG/ML injection Inject 150 mg into the muscle every 30 (thirty) days.    [provider]  insulin detemir (LEVEMIR) 100 UNIT/ML injection Inject 0.4 mLs (40 Units total) into the skin 2 (two) times daily. 10/14/21   Delfin Gant, NP  lithium carbonate (ESKALITH) 450 MG CR tablet Take 450 mg by mouth at bedtime.    [provider]  metoprolol tartrate (LOPRESSOR) 25 MG tablet Take 12.5 mg by mouth 2 (two) times daily.     [provider]  nystatin cream (MYCOSTATIN) Apply 1 Application topically 2 (two) times daily. Applying vaginally 01/07/22   [provider]    Family History Family History  Problem Relation Age of Onset   Hypertension Mother    Diabetes Father     Social History Social History   Tobacco Use   Smoking status: Every Day    Types: Cigars   Smokeless tobacco: Never   Tobacco comments:    Pt declined  Vaping Use   Vaping Use: Never used  Substance Use Topics   Alcohol use: Not Currently   Drug use: Not Currently    Types: "Crack" cocaine, Other-see comments    Comment: Patient reports hx of smoking Crack     Allergies   Wellbutrin [bupropion], Omnipaque [iohexol], Penicillins, Atarax [hydroxyzine], Contrast media [iodinated contrast media], and Cymbalta [duloxetine hcl]   Review of Systems Review of Systems  All other systems reviewed and are  negative.  See HPI for ROS  Physical Exam Updated Vital Signs BP 130/80 (BP Location: Left Arm)   Pulse (!) 103   Temp 97.9 F (36.6 C) (Oral)   Resp 18   LMP 01/30/2022   SpO2 100%   Physical Exam Vitals and nursing note reviewed.  Constitutional:      Appearance: She is obese.  HENT:     Right Ear: Tympanic membrane normal.     Left Ear: Tympanic membrane normal.     Nose: Congestion and rhinorrhea present.     Mouth/Throat:     Mouth: Mucous membranes are moist.     Pharynx: Uvula midline. No pharyngeal swelling, oropharyngeal exudate, posterior oropharyngeal erythema or uvula swelling.     Tonsils: No tonsillar exudate or tonsillar abscesses.     Comments: White exudate on peripheries of anterior portion of tongue. No visible exudates to other oral mucosa.  Cardiovascular:     Rate and Rhythm: Normal rate and regular rhythm.     Heart sounds: Normal heart sounds. No murmur heard. Pulmonary:     Effort: Pulmonary effort is normal. No respiratory distress.     Breath sounds: Normal breath  sounds. No stridor. No wheezing, rhonchi or rales.  Abdominal:     General: Bowel sounds are normal. There is no distension.     Palpations: Abdomen is soft. There is no mass.     Tenderness: There is no abdominal tenderness. There is no guarding.     Hernia: No hernia is present.  Musculoskeletal:     Cervical back: Neck supple.  Lymphadenopathy:     Cervical: Cervical adenopathy present.  Skin:    General: Skin is warm and dry.     Findings: No rash.  Neurological:     General: No focal deficit present.     Mental Status: She is alert and oriented to person, place, and time.  Psychiatric:        Mood and Affect: Mood normal.        Behavior: Behavior normal.      ED Treatments / Results  Labs (all labs ordered are listed, but only abnormal results are displayed) Labs Reviewed - No data to display  EKG  Radiology No results found.  Procedures Procedures (including critical care time)  Medications Ordered in ED Medications - No data to display   Initial Impression / Assessment and Plan / ED Course  I have reviewed the triage vital signs and the nursing notes.  Pertinent labs & imaging results that were available during my care of the patient were reviewed by me and considered in my medical decision making (see chart for details).     Patient is positive for COVID-19, negative for strep. Discussed possibility of prescribing Paxlovid. Shabana and I decided against it due to potential for interaction with patient's prescribed medications and lack of severe symptoms. Will be prescribing magic mouthwash to treat the potential thrush on tongue. Giving cough medication per patient request to help with sleep. Instructed patient to return to urgent care or emergency department for new or worsening symptoms.   Final Clinical Impressions(s) / ED Diagnoses   Final diagnoses:  HALPF-79 virus infection  Sore throat    New Prescriptions Discharge Medication List as of  02/11/2022  4:02 PM     START taking these medications   Details  magic mouthwash (nystatin, lidocaine, diphenhydrAMINE) suspension Take 15 mLs by mouth 4 (four) times daily as needed for mouth pain (gargle and  spit)., Starting Wed 02/11/2022, Normal    promethazine-dextromethorphan (PROMETHAZINE-DM) 6.25-15 MG/5ML syrup Take 5 mLs by mouth 4 (four) times daily as needed for cough., Starting Wed 02/11/2022, Normal

## 2022-02-11 NOTE — ED Provider Notes (Signed)
[place]   [id number] 02/11/22 Arrival Time: 6767  ASSESSMENT & PLAN:  1. COVID-19 virus infection   2. Sore throat    Discussed typical duration of viral illnesses, including COVID. OTC symptom care as needed.  Meds ordered this encounter  Medications   promethazine-dextromethorphan (PROMETHAZINE-DM) 6.25-15 MG/5ML syrup    Sig: Take 5 mLs by mouth 4 (four) times daily as needed for cough.    Dispense:  118 mL    Refill:  0   magic mouthwash (nystatin, lidocaine, diphenhydrAMINE) suspension    Sig: Take 15 mLs by mouth 4 (four) times daily as needed for mouth pain (gargle and spit).    Dispense:  320 mL    Refill:  0   Pt left before AVS provided.   Follow-up Information     Trey Sailors, [place].   Specialty: Physician Assistant Why: As needed. Contact information: [place] [address] [phone]                 Reviewed expectations re: course of current medical issues. Questions answered. Outlined signs and symptoms indicating need for more acute intervention. Understanding verbalized. After Visit Summary given.   SUBJECTIVE: History from: Patient. [name] is a 31 y.o. female. Reports: sore throat and nasal congestion. Pt was at [facility] ED today and LWBS after triage. Strep was neg. Pt was called to report + COVID test. Here for evaluation. Denies: fever and difficulty breathing. Normal PO intake without n/v/d.  OBJECTIVE:  Vitals:   02/11/22 1445  BP: 130/80  Pulse: (!) 103  Resp: 18  Temp: 97.9 F (36.6 C)  TempSrc: Oral  SpO2: 100%    Slight tachycardia noted. General appearance: alert; no distress Eyes: PERRLA; EOMI; conjunctiva normal HENT: [place]; AT; with nasal congestion Neck: supple  Lungs: speaks full sentences without difficulty; unlabored; CTAB Extremities: no edema Skin: warm and dry Neurologic: normal gait Psychological: alert and cooperative; normal mood and affect  Labs: Results  for orders placed or performed during the hospital encounter of 02/11/22  SARS Coronavirus 2 by RT PCR (hospital order, performed in [facility] hospital lab) *cepheid single result test* Anterior Nasal Swab   Specimen: Anterior Nasal Swab  Result Value Ref Range   SARS Coronavirus 2 by RT PCR POSITIVE (A) NEGATIVE  Group A Strep by PCR   Specimen: Anterior Nasal Swab; Sterile Swab  Result Value Ref Range   Group A Strep by PCR NOT DETECTED NOT DETECTED     Allergies  Allergen Reactions   Wellbutrin [Bupropion] Shortness Of Breath   Omnipaque [Iohexol] Swelling and Other (See Comments)    Eye swelling   Penicillins Hives and Other (See Comments)    Has patient had a PCN reaction causing immediate rash, facial/tongue/throat swelling, SOB or lightheadedness with hypotension: Unknown Has patient had a PCN reaction causing severe rash involving mucus membranes or skin necrosis: Yes Has patient had a PCN reaction that required hospitalization Unknown Has patient had a PCN reaction occurring within the last 10 years: No If all of the above answers are "NO", then may proceed with Cephalosporin use.   Atarax [Hydroxyzine] Other (See Comments)    Causes hyperactivity, makes pt want to "fight"   Contrast Media [Iodinated Contrast Media] Other (See Comments)    Eyes swell up   Cymbalta [Duloxetine Hcl] Other (See Comments)    No appetite and makes the patient "act up"    Past Medical History:  Diagnosis Date  Anxiety    Bipolar 1 disorder (HCC)    Cognitive deficits    Depression    Diabetes mellitus without complication (HCC)    Hypertension    Mental disorder    Mental health disorder    Obesity    Social History   Socioeconomic History   Marital status: Single    Spouse name: Not on file   Number of children: Not on file   Years of education: Not on file   Highest education level: Not on file  Occupational History   Not on file  Tobacco Use   Smoking status: Every Day     Types: Cigars   Smokeless tobacco: Never   Tobacco comments:    Pt declined  Vaping Use   Vaping Use: Never used  Substance and Sexual Activity   Alcohol use: Not Currently   Drug use: Not Currently    Types: "Crack" cocaine, Other-see comments    Comment: Patient reports hx of smoking Crack   Sexual activity: Not Currently    Birth control/protection: None    Comment: occasionally  Other Topics Concern   Not on file  Social History Narrative   ** Merged History Encounter **       Social Determinants of Health   Financial Resource Strain: Not on file  Food Insecurity: Not on file  Transportation Needs: Not on file  Physical Activity: Not on file  Stress: Not on file  Social Connections: Not on file  Intimate Partner Violence: Not on file   Family History  Problem Relation Age of Onset   Hypertension Mother    Diabetes Father    Past Surgical History:  Procedure Laterality Date   CESAREAN SECTION     CESAREAN SECTION N/A 04/25/2013   Procedure: REPEAT CESAREAN SECTION;  Surgeon: [name], MD;  Location: [facility];  Service: Obstetrics;  Laterality: N/A;   MASS EXCISION N/A 06/03/2012   Procedure: EXCISION MASS;  Surgeon: [name], MD;  Location: [facility];  Service: ENT;  Laterality: N/A;  Excision uvula mass   TONSILLECTOMY N/A 06/03/2012   Procedure: TONSILLECTOMY;  Surgeon: [name], MD;  Location: [facility];  Service: ENT;  Laterality: N/A;   Evonnie Dawes, MD 02/11/22 1648

## 2022-02-13 ENCOUNTER — Encounter (HOSPITAL_COMMUNITY): Payer: Self-pay

## 2022-02-13 ENCOUNTER — Emergency Department (HOSPITAL_COMMUNITY)
Admission: EM | Admit: 2022-02-13 | Discharge: 2022-02-13 | Discharge status: Against Medical Advice | Payer: Medicaid Other | Attending: Emergency Medicine | Admitting: Emergency Medicine

## 2022-02-13 ENCOUNTER — Other Ambulatory Visit: Payer: Self-pay

## 2022-02-13 DIAGNOSIS — Z5321 Procedure and treatment not carried out due to patient leaving prior to being seen by health care provider: Secondary | ICD-10-CM | POA: Insufficient documentation

## 2022-02-13 DIAGNOSIS — U071 COVID-19: Secondary | ICD-10-CM | POA: Insufficient documentation

## 2022-02-13 DIAGNOSIS — R300 Dysuria: Secondary | ICD-10-CM | POA: Diagnosis not present

## 2022-02-13 DIAGNOSIS — R0981 Nasal congestion: Secondary | ICD-10-CM | POA: Insufficient documentation

## 2022-02-13 DIAGNOSIS — N898 Other specified noninflammatory disorders of vagina: Secondary | ICD-10-CM | POA: Diagnosis present

## 2022-02-13 NOTE — ED Triage Notes (Signed)
Patient c/o a white vaginal discharge and dysuria x 3 weeks.  Patient states she tested positive for Covid yesterday and is still having a sore throat.

## 2022-02-13 NOTE — ED Provider Triage Note (Signed)
Emergency Medicine Provider Triage Evaluation Note  [name] , a 31 y.o. female  was evaluated in triage.  Pt complains of URI symptoms and vaginal discharge with dysuria.  Seen by family medicine yesterday for same URI symptoms of congestion and sore throat, tested positive for COVID.  Per note left before AVS.  States these symptoms remain unchanged.  Describes vaginal discharge is creamy with discomfort upon urination.  Denies blood in the urine.  Endorses possibility of STD exposure.  Urinary/vaginal symptoms ongoing for the last 3 weeks.  Denies fevers or chills, nausea or vomiting.  Review of Systems  Positive:  Negative: See above  Physical Exam  BP (!) 141/120 (BP Location: Left Arm)   Pulse 89   Temp 98.9 F (37.2 C) (Oral)   Resp 18   LMP 01/30/2022   SpO2 98%  Gen:   Awake, no distress   Resp:  Normal effort, equal chest rise MSK:   Moves extremities without difficulty  Other:  Communicates without difficulty.  Airway patent.  Oropharynx appears dry.  Medical Decision Making  Medically screening exam initiated at 2:59 PM.  Appropriate orders placed.  Lannah Keilly Fatula was informed that the remainder of the evaluation will be completed by another provider, this initial triage assessment does not replace that evaluation, and the importance of remaining in the ED until their evaluation is complete.     Prince Rome, PA-C 83/81/84 1503

## 2022-02-15 ENCOUNTER — Encounter (HOSPITAL_COMMUNITY): Payer: Self-pay | Admitting: Emergency Medicine

## 2022-02-15 ENCOUNTER — Emergency Department (HOSPITAL_COMMUNITY)
Admission: EM | Admit: 2022-02-15 | Discharge: 2022-02-15 | Discharge status: Against Medical Advice | Payer: Medicaid Other | Attending: Student | Admitting: Student

## 2022-02-15 DIAGNOSIS — R300 Dysuria: Secondary | ICD-10-CM | POA: Insufficient documentation

## 2022-02-15 DIAGNOSIS — N898 Other specified noninflammatory disorders of vagina: Secondary | ICD-10-CM | POA: Insufficient documentation

## 2022-02-15 DIAGNOSIS — Z5321 Procedure and treatment not carried out due to patient leaving prior to being seen by health care provider: Secondary | ICD-10-CM | POA: Diagnosis not present

## 2022-02-15 NOTE — ED Notes (Signed)
No answer from pt when called for room x 1.  

## 2022-02-15 NOTE — ED Triage Notes (Signed)
Patient c/o a white vaginal discharge and dysuria x 3 weeks.

## 2022-02-15 NOTE — ED Notes (Signed)
No answer from pt when called for room x 2.  

## 2022-02-15 NOTE — ED Notes (Signed)
No answer from pt when called for room x 3.

## 2022-02-17 ENCOUNTER — Other Ambulatory Visit: Payer: Self-pay

## 2022-02-17 ENCOUNTER — Ambulatory Visit (HOSPITAL_COMMUNITY)
Admission: EM | Admit: 2022-02-17 | Discharge: 2022-02-17 | Disposition: A | Discharge status: Home / Self Care | Payer: Medicaid Other | Attending: Physician Assistant | Admitting: Physician Assistant

## 2022-02-17 ENCOUNTER — Encounter (HOSPITAL_COMMUNITY): Payer: Self-pay | Admitting: *Deleted

## 2022-02-17 DIAGNOSIS — B3731 Acute candidiasis of vulva and vagina: Secondary | ICD-10-CM

## 2022-02-17 MED ORDER — FLUCONAZOLE 150 MG PO TABS: 150.0000 mg | ORAL_TABLET | Freq: Every day | ORAL | 1 refills | Status: DC

## 2022-02-17 NOTE — ED Provider Notes (Signed)
[facility]    CSN: [id number] Arrival date & time: 02/17/22  1319      History   Chief Complaint Chief Complaint  Patient presents with   Vaginal Discharge   Vaginal Itching    HPI [name] is a 31 y.o. female.   The history is provided by the patient. No language interpreter was used.  Vaginal Discharge Quality:  Thick Severity:  Moderate Onset quality:  Gradual Duration:  8 days Timing:  Constant Progression:  Worsening Chronicity:  New Relieved by:  Nothing Worsened by:  Nothing Ineffective treatments:  None tried Associated symptoms: vaginal itching   Risk factors: no STI   Vaginal Itching    Past Medical History:  Diagnosis Date   Anxiety    Bipolar 1 disorder (HCC)    Cognitive deficits    Depression    Diabetes mellitus without complication ([place])    Hypertension    Mental disorder    Mental health disorder    Obesity     Patient Active Problem List   Diagnosis Date Noted   Suicidal ideations 01/04/2022   Suicidal overdose ([place]) 03/23/2021   Syphilis 07/15/2020   Malingering 06/05/2020   Gastroesophageal reflux disease 05/04/2020   Hyperglycemia due to type 2 diabetes mellitus ([place]) 05/04/2020   Long term (current) use of insulin ([place]) 05/04/2020   Migraine without aura 05/04/2020   Morbid obesity ([place]) 05/04/2020   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 2 diabetes mellitus ([place]) 05/04/2020   Prolapsed internal hemorrhoids 05/04/2020   Vitamin D deficiency 05/04/2020   Other symptoms and signs involving cognitive functions and awareness 05/04/2020   Suicide attempt ([place])    Anxiety state 03/06/2020   Schizophrenia ([place]) 09/13/2019   Bipolar I disorder, most recent episode depressed ([place]) 06/23/2019   MDD (major depressive disorder) 10/10/2018   Schizoaffective disorder, bipolar type ([place]) 09/25/2018   Affective psychosis, bipolar ([place]) 06/13/2018   HTN (hypertension) 05/03/2018   Tobacco use disorder 05/03/2018   Adjustment  disorder with emotional disturbance 01/02/2018   Schizophrenia, 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   Moderate bipolar I disorder, most recent episode depressed ([place])    Schizoaffective disorder ([place])    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/2017   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   OCD (obsessive compulsive disorder) 10/05/2016   Major depressive disorder, recurrent episode, mild ([place]) 05/04/2016   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   Learning disability 07/18/2015   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/2015   Diabetes mellitus ([place]) 07/18/2015   MDD (major depressive disorder), recurrent, severe, 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   Hyperlipidemia 07/18/2015   Severe episode of recurrent major depressive disorder, without psychotic features ([place])    Suicidal ideation    Intentional overdose ([place])    Cognitive deficits 10/12/2012   Generalized anxiety disorder 06/28/2012    Past Surgical History:  Procedure Laterality Date   CESAREAN SECTION     CESAREAN SECTION N/A 04/25/2013   Procedure: REPEAT CESAREAN SECTION;  Surgeon: [name], MD;  Location: [facility];  Service: Obstetrics;  Laterality: N/A;   MASS EXCISION N/A 06/03/2012   Procedure: EXCISION MASS;  Surgeon: [name], MD;  Location: [facility];  Service: ENT;  Laterality: N/A;  Excision uvula mass   TONSILLECTOMY N/A 06/03/2012   Procedure: TONSILLECTOMY;  Surgeon: [name], MD;  Location: [facility];  Service: ENT;  Laterality: N/A;   TONSILLECTOMY      OB History     Gravida  3  Para  3   Term  3   Preterm  0   AB  0   Living  3      SAB  0   IAB  0   Ectopic  0   Multiple      Live Births  3            Home Medications    Prior to Admission medications   Medication Sig Start Date End Date Taking? Authorizing Provider  fluconazole (DIFLUCAN) 150 MG tablet Take 1 tablet (150 mg total) by mouth daily. 02/17/22  Yes  Caryl Ada K, PA-C  benztropine (COGENTIN) 0.5 MG tablet Take 0.5 mg by mouth 2 (two) times daily.    [provider]  Cholecalciferol (VITAMIN D-3 PO) Take 1 tablet by mouth daily.    [provider]  gabapentin (NEURONTIN) 100 MG capsule Take 100 mg by mouth 3 (three) times daily.    [provider]  haloperidol (HALDOL) 10 MG tablet Take 10 mg by mouth at bedtime. Take with '5mg'$  tablet for '15mg'$  dose.    [provider]  haloperidol (HALDOL) 5 MG tablet Take 5 mg by mouth at bedtime. Take with '10mg'$  tablet for '15mg'$  dose.    [provider]  haloperidol decanoate (HALDOL DECANOATE) 100 MG/ML injection Inject 150 mg into the muscle every 30 (thirty) days.    [provider]  insulin detemir (LEVEMIR) 100 UNIT/ML injection Inject 0.4 mLs (40 Units total) into the skin 2 (two) times daily. 10/14/21   Delfin Gant, NP  lithium carbonate (ESKALITH) 450 MG CR tablet Take 450 mg by mouth at bedtime.    [provider]  magic mouthwash (nystatin, lidocaine, diphenhydrAMINE) suspension Take 15 mLs by mouth 4 (four) times daily as needed for mouth pain (gargle and spit). 02/11/22   Vanessa Kick, MD  metoprolol tartrate (LOPRESSOR) 25 MG tablet Take 12.5 mg by mouth 2 (two) times daily.    [provider]  nystatin cream (MYCOSTATIN) Apply 1 Application topically 2 (two) times daily. Applying vaginally 01/07/22   [provider]  promethazine-dextromethorphan (PROMETHAZINE-DM) 6.25-15 MG/5ML syrup Take 5 mLs by mouth 4 (four) times daily as needed for cough. 02/11/22   Vanessa Kick, MD    Family History Family History  Problem Relation Age of Onset   Hypertension Mother    Diabetes Father     Social History Social History   Tobacco Use   Smoking status: Every Day    Types: Cigars   Smokeless tobacco: Never   Tobacco comments:    Pt declined  Vaping Use   Vaping Use: Never used  Substance Use Topics    Alcohol use: Not Currently   Drug use: Not Currently    Types: "Crack" cocaine, Other-see comments    Comment: Patient reports hx of smoking Crack     Allergies   Wellbutrin [bupropion], Omnipaque [iohexol], Penicillins, Atarax [hydroxyzine], Contrast media [iodinated contrast media], and Cymbalta [duloxetine hcl]   Review of Systems Review of Systems  Genitourinary:  Positive for vaginal discharge.  All other systems reviewed and are negative.    Physical Exam Triage Vital Signs ED Triage Vitals  Enc Vitals Group     BP 02/17/22 1514 (!) 150/99     Pulse Rate 02/17/22 1514 97     Resp 02/17/22 1514 20     Temp 02/17/22 1514 98.4 F (36.9 C)     Temp src --  SpO2 02/17/22 1514 98 %     Weight --      Height --      Head Circumference --      Peak Flow --      Pain Score 02/17/22 1511 8     Pain Loc --      Pain Edu? --      Excl. in [place]? --    No data found.  Updated Vital Signs BP (!) 150/99   Pulse 97   Temp 98.4 F (36.9 C)   Resp 20   LMP 01/30/2022   SpO2 98%   Visual Acuity Right Eye Distance:   Left Eye Distance:   Bilateral Distance:    Right Eye Near:   Left Eye Near:    Bilateral Near:     Physical Exam Vitals and nursing note reviewed.  Constitutional:      Appearance: She is well-developed.  HENT:     Head: Normocephalic.  Cardiovascular:     Rate and Rhythm: Normal rate.  Pulmonary:     Effort: Pulmonary effort is normal.  Abdominal:     General: There is no distension.  Musculoskeletal:        General: Normal range of motion.     Cervical back: Normal range of motion.  Skin:    General: Skin is warm.  Neurological:     Mental Status: She is alert and oriented to person, place, and time.  Psychiatric:        Mood and Affect: Mood normal.      UC Treatments / Results  Labs (all labs ordered are listed, but only abnormal results are displayed) Labs Reviewed  CERVICOVAGINAL ANCILLARY ONLY    EKG   Radiology No  results found.  Procedures Procedures (including critical care time)  Medications Ordered in UC Medications - No data to display  Initial Impression / Assessment and Plan / UC Course  I have reviewed the triage vital signs and the nursing notes.  Pertinent labs & imaging results that were available during my care of the patient were reviewed by me and considered in my medical decision making (see chart for details).     MDM:  std test pending.  Pt given rx for diflucan  Final Clinical Impressions(s) / UC Diagnoses   Final diagnoses:  Yeast vaginitis     Discharge Instructions      Return if any problems.    ED Prescriptions     Medication Sig Dispense Auth. Provider   fluconazole (DIFLUCAN) 150 MG tablet Take 1 tablet (150 mg total) by mouth daily. 1 tablet Fransico Meadow, [place]      PDMP not reviewed this encounter.   Fransico Meadow, [place] 02/17/22 1546

## 2022-02-17 NOTE — ED Triage Notes (Signed)
Pt reports for about 8 days she has had a vag discharge with itching and Pt also reports swelling to same area,

## 2022-02-17 NOTE — Discharge Instructions (Addendum)
Return if any problems.

## 2022-02-18 ENCOUNTER — Telehealth (HOSPITAL_COMMUNITY): Payer: Self-pay | Admitting: Emergency Medicine

## 2022-02-18 LAB — CERVICOVAGINAL ANCILLARY ONLY
Bacterial Vaginitis (gardnerella): POSITIVE — AB
Candida Glabrata: NEGATIVE
Candida Vaginitis: POSITIVE — AB
Chlamydia: NEGATIVE
Comment: NEGATIVE
Comment: NEGATIVE
Comment: NEGATIVE
Comment: NEGATIVE
Comment: NEGATIVE
Comment: NORMAL
Neisseria Gonorrhea: NEGATIVE
Trichomonas: NEGATIVE

## 2022-02-18 MED ORDER — METRONIDAZOLE 500 MG PO TABS: 500.0000 mg | ORAL_TABLET | Freq: Two times a day (BID) | ORAL | 0 refills | Status: DC

## 2022-03-11 ENCOUNTER — Other Ambulatory Visit: Payer: Self-pay

## 2022-03-11 ENCOUNTER — Emergency Department (HOSPITAL_COMMUNITY)
Admission: EM | Admit: 2022-03-11 | Discharge: 2022-03-11 | Disposition: A | Discharge status: Home / Self Care | Payer: Medicaid Other | Attending: Emergency Medicine | Admitting: Emergency Medicine

## 2022-03-11 DIAGNOSIS — Z760 Encounter for issue of repeat prescription: Secondary | ICD-10-CM | POA: Insufficient documentation

## 2022-03-11 DIAGNOSIS — I10 Essential (primary) hypertension: Secondary | ICD-10-CM | POA: Diagnosis not present

## 2022-03-11 DIAGNOSIS — Z794 Long term (current) use of insulin: Secondary | ICD-10-CM | POA: Insufficient documentation

## 2022-03-11 DIAGNOSIS — Z79899 Other long term (current) drug therapy: Secondary | ICD-10-CM | POA: Insufficient documentation

## 2022-03-11 DIAGNOSIS — E119 Type 2 diabetes mellitus without complications: Secondary | ICD-10-CM | POA: Diagnosis not present

## 2022-03-11 DIAGNOSIS — Z1339 Encounter for screening examination for other mental health and behavioral disorders: Secondary | ICD-10-CM | POA: Insufficient documentation

## 2022-03-11 NOTE — ED Provider Notes (Signed)
[facility] DEPT Provider Note   CSN: [id number] Arrival date & time: 03/11/22  1314     History PMH: Bipolar 1, DM, Depression, Anxiety, Obesity, HTN Chief Complaint  Patient presents with   Psychiatric Evaluation    [name] is a 31 y.o. female.  Patient requesting 150 mg Haldol shot. She doesn't know when she last had it. She says her psychiatrist normally prescribes this for her but were wanting to take her off of it because she was doing so well. Denies SI, HI, Hallucinations. No other complaints.  HPI     Home Medications Prior to Admission medications   Medication Sig Start Date End Date Taking? Authorizing Provider  benztropine (COGENTIN) 0.5 MG tablet Take 0.5 mg by mouth 2 (two) times daily.    [provider]  Cholecalciferol (VITAMIN D-3 PO) Take 1 tablet by mouth daily.    [provider]  fluconazole (DIFLUCAN) 150 MG tablet Take 1 tablet (150 mg total) by mouth daily. 02/17/22   Fransico Meadow, PA-C  gabapentin (NEURONTIN) 100 MG capsule Take 100 mg by mouth 3 (three) times daily.    [provider]  haloperidol (HALDOL) 10 MG tablet Take 10 mg by mouth at bedtime. Take with '5mg'$  tablet for '15mg'$  dose.    [provider]  haloperidol (HALDOL) 5 MG tablet Take 5 mg by mouth at bedtime. Take with '10mg'$  tablet for '15mg'$  dose.    [provider]  haloperidol decanoate (HALDOL DECANOATE) 100 MG/ML injection Inject 150 mg into the muscle every 30 (thirty) days.    [provider]  insulin detemir (LEVEMIR) 100 UNIT/ML injection Inject 0.4 mLs (40 Units total) into the skin 2 (two) times daily. 10/14/21   Delfin Gant, NP  lithium carbonate (ESKALITH) 450 MG CR tablet Take 450 mg by mouth at bedtime.    [provider]  magic mouthwash (nystatin, lidocaine, diphenhydrAMINE) suspension Take 15 mLs by mouth 4 (four) times daily as needed for mouth pain (gargle and spit).  02/11/22   Vanessa Kick, MD  metoprolol tartrate (LOPRESSOR) 25 MG tablet Take 12.5 mg by mouth 2 (two) times daily.    [provider]  metroNIDAZOLE (FLAGYL) 500 MG tablet Take 1 tablet (500 mg total) by mouth 2 (two) times daily. 02/18/22   Lamptey, Myrene Galas, MD  nystatin cream (MYCOSTATIN) Apply 1 Application topically 2 (two) times daily. Applying vaginally 01/07/22   [provider]  promethazine-dextromethorphan (PROMETHAZINE-DM) 6.25-15 MG/5ML syrup Take 5 mLs by mouth 4 (four) times daily as needed for cough. 02/11/22   Vanessa Kick, MD      Allergies    Wellbutrin [bupropion], Omnipaque [iohexol], Penicillins, Atarax [hydroxyzine], Contrast media [iodinated contrast media], and Cymbalta [duloxetine hcl]    Review of Systems   Review of Systems  All other systems reviewed and are negative.   Physical Exam Updated Vital Signs BP (!) 157/109   Pulse (!) 108   Temp 98.3 F (36.8 C) (Oral)   SpO2 96%  Physical Exam Vitals and nursing note reviewed.  Constitutional:      General: She is not in acute distress.    Appearance: Normal appearance. She is well-developed. She is not ill-appearing, toxic-appearing or diaphoretic.  HENT:     Head: Normocephalic and atraumatic.     Nose: No nasal deformity.     Mouth/Throat:     Lips: Pink. No lesions.  Eyes:     General: Gaze aligned appropriately. No  scleral icterus.       Right eye: No discharge.        Left eye: No discharge.     Conjunctiva/sclera: Conjunctivae normal.     Right eye: Right conjunctiva is not injected. No exudate or hemorrhage.    Left eye: Left conjunctiva is not injected. No exudate or hemorrhage. Pulmonary:     Effort: Pulmonary effort is normal. No respiratory distress.  Skin:    General: Skin is warm and dry.  Neurological:     Mental Status: She is alert and oriented to person, place, and time.  Psychiatric:        Mood and Affect: Mood normal.        Speech: Speech normal.         Behavior: Behavior normal. Behavior is cooperative.     ED Results / Procedures / Treatments   Labs (all labs ordered are listed, but only abnormal results are displayed) Labs Reviewed - No data to display  EKG None  Radiology No results found.  Procedures Procedures   Medications Ordered in ED Medications - No data to display  ED Course/ Medical Decision Making/ A&P                           Medical Decision Making  Patient is here for haldol injection. Unfortunately, the patient does not know the last time she has had this and also this is not in the chart. She also mentions that her psychiatrist was no longer was wanting her to have this medication. As patient is not in psychiatric crisis, and unknown when she last had this, I do not feel it is appropriate to prescribe from the ED. I recommend that she contact her psychiatrist to restart this medication if needed.    Final Clinical Impression(s) / ED Diagnoses Final diagnoses:  Encounter for medication refill    Rx / DC Orders ED Discharge Orders     None         Adolphus Birchwood, PA-C 03/11/22 1354    Regan Lemming, MD 03/11/22 1455

## 2022-03-11 NOTE — ED Triage Notes (Signed)
Pt reports overdue for her haldol injection and now feeling stressed out.  Denies si/hi

## 2022-03-21 ENCOUNTER — Other Ambulatory Visit: Payer: Self-pay

## 2022-03-21 ENCOUNTER — Emergency Department (HOSPITAL_COMMUNITY)
Admission: EM | Admit: 2022-03-21 | Discharge: 2022-03-21 | Discharge status: Against Medical Advice | Payer: Medicaid Other | Attending: Emergency Medicine | Admitting: Emergency Medicine

## 2022-03-21 ENCOUNTER — Encounter (HOSPITAL_COMMUNITY): Payer: Self-pay

## 2022-03-21 DIAGNOSIS — Z5321 Procedure and treatment not carried out due to patient leaving prior to being seen by health care provider: Secondary | ICD-10-CM | POA: Diagnosis not present

## 2022-03-21 DIAGNOSIS — R739 Hyperglycemia, unspecified: Secondary | ICD-10-CM | POA: Insufficient documentation

## 2022-03-21 DIAGNOSIS — I10 Essential (primary) hypertension: Secondary | ICD-10-CM | POA: Diagnosis present

## 2022-03-21 NOTE — ED Triage Notes (Signed)
Per EMS- Patient c/o hypertension and hyperglycemia.  Patient's CBG was 434 with EMS. Patient states she gave herself 40 units Insulin approx 1 1/2 hours ago.

## 2022-03-21 NOTE — ED Triage Notes (Signed)
Patient said she was at a family members house and said she checked her BP and it was elevated. Stated the EMS checked her glucose and it was 433. No headache or dizziness. She said she just feels funny.

## 2022-03-21 NOTE — ED Notes (Signed)
Patient said she is not waiting this long. Patient educated on the risks of leaving without being seen by the doctor. Patient said she needs to catch the bus. Patient walked out of the department.

## 2022-03-23 ENCOUNTER — Emergency Department (HOSPITAL_COMMUNITY)
Admission: EM | Admit: 2022-03-23 | Discharge: 2022-03-23 | Discharge status: Against Medical Advice | Payer: Medicaid Other | Attending: Emergency Medicine | Admitting: Emergency Medicine

## 2022-03-23 ENCOUNTER — Other Ambulatory Visit: Payer: Self-pay

## 2022-03-23 ENCOUNTER — Encounter (HOSPITAL_COMMUNITY): Payer: Self-pay

## 2022-03-23 DIAGNOSIS — R42 Dizziness and giddiness: Secondary | ICD-10-CM | POA: Diagnosis not present

## 2022-03-23 DIAGNOSIS — Z5321 Procedure and treatment not carried out due to patient leaving prior to being seen by health care provider: Secondary | ICD-10-CM | POA: Diagnosis not present

## 2022-03-23 DIAGNOSIS — R739 Hyperglycemia, unspecified: Secondary | ICD-10-CM | POA: Diagnosis not present

## 2022-03-23 LAB — URINALYSIS, ROUTINE W REFLEX MICROSCOPIC
Bacteria, UA: NONE SEEN
Bilirubin Urine: NEGATIVE
Glucose, UA: >=500 mg/dL — AB
Hgb urine dipstick: NEGATIVE
Ketones, ur: NEGATIVE mg/dL
Leukocytes,Ua: NEGATIVE
Nitrite: NEGATIVE
Protein, ur: NEGATIVE mg/dL
Specific Gravity, Urine: 1.022 (ref 1.005–1.030)
pH: 6 (ref 5.0–8.0)

## 2022-03-23 LAB — BASIC METABOLIC PANEL
Anion gap: 10 (ref 5–15)
BUN: 12 mg/dL (ref 6–20)
CO2: 22 mmol/L (ref 22–32)
Calcium: 9.1 mg/dL (ref 8.9–10.3)
Chloride: 104 mmol/L (ref 98–111)
Creatinine, Ser: 0.7 mg/dL (ref 0.44–1.00)
GFR, Estimated: >60 mL/min (ref >60)
Glucose, Bld: 337 mg/dL — ABNORMAL HIGH (ref 70–99)
Potassium: 3.9 mmol/L (ref 3.5–5.1)
Sodium: 136 mmol/L (ref 135–145)

## 2022-03-23 LAB — CBC
HCT: 34.6 % — ABNORMAL LOW (ref 36.0–46.0)
Hemoglobin: 10.8 g/dL — ABNORMAL LOW (ref 12.0–15.0)
MCH: 24.3 pg — ABNORMAL LOW (ref 26.0–34.0)
MCHC: 31.2 g/dL (ref 30.0–36.0)
MCV: 77.9 fL — ABNORMAL LOW (ref 80.0–100.0)
Platelets: 277 10*3/uL (ref 150–400)
RBC: 4.44 MIL/uL (ref 3.87–5.11)
RDW: 15.4 % (ref 11.5–15.5)
WBC: 11.3 10*3/uL — ABNORMAL HIGH (ref 4.0–10.5)
nRBC: 0 % (ref 0.0–0.2)

## 2022-03-23 LAB — CBG MONITORING, ED: Glucose-Capillary: 349 mg/dL — ABNORMAL HIGH (ref 70–99)

## 2022-03-23 NOTE — ED Provider Triage Note (Signed)
Emergency Medicine Provider Triage Evaluation Note  [name] , a 31 y.o. female  was evaluated in triage.  Pt complains of high blood sugar, feeling off, dizziness.  Patient reports that she has been taking her insulin, and oral antihyperglycemic's as prescribed.  Patient is known well to this emergency department with many frequent visits.  She denies any chest pain, shortness of breath, fever, chills, difficulty breathing, dysuria, polyuria, polydipsia..  Review of Systems  Positive: Hyperglycemia, dizziness Negative: As above  Physical Exam  BP (!) 154/105 (BP Location: Left Arm)   Pulse 100   Temp 99 F (37.2 C) (Oral)   Resp 19   Ht '5\' 7"'$  (1.702 m)   Wt 113.4 kg   SpO2 98%   BMI 39.16 kg/m  Gen:   Awake, no distress   Resp:  Normal effort  MSK:   Moves extremities without difficulty  Other:    Medical Decision Making  Medically screening exam initiated at 2:46 AM.  Appropriate orders placed.  [name] was informed that the remainder of the evaluation will be completed by another provider, this initial triage assessment does not replace that evaluation, and the importance of remaining in the ED until their evaluation is complete.  Workup initiated   [name], [place] 03/23/22 8588

## 2022-03-23 NOTE — ED Triage Notes (Signed)
Patient states that she has been lightheaded, and having burry vision for the last week that has gotten worse tonight, the patient states that her blood sugars have been elevated over the past week. BS is 349- at triage

## 2022-03-25 ENCOUNTER — Emergency Department (HOSPITAL_COMMUNITY)
Admission: EM | Admit: 2022-03-25 | Discharge: 2022-03-25 | Discharge status: Against Medical Advice | Payer: Medicaid Other | Attending: Emergency Medicine | Admitting: Emergency Medicine

## 2022-03-25 ENCOUNTER — Other Ambulatory Visit: Payer: Self-pay

## 2022-03-25 ENCOUNTER — Encounter (HOSPITAL_COMMUNITY): Payer: Self-pay | Admitting: Emergency Medicine

## 2022-03-25 DIAGNOSIS — N898 Other specified noninflammatory disorders of vagina: Secondary | ICD-10-CM | POA: Insufficient documentation

## 2022-03-25 DIAGNOSIS — Z5321 Procedure and treatment not carried out due to patient leaving prior to being seen by health care provider: Secondary | ICD-10-CM | POA: Diagnosis not present

## 2022-03-25 LAB — COMPREHENSIVE METABOLIC PANEL
ALT: 22 U/L (ref 0–44)
AST: 22 U/L (ref 15–41)
Albumin: 4.1 g/dL (ref 3.5–5.0)
Alkaline Phosphatase: 67 U/L (ref 38–126)
Anion gap: 8 (ref 5–15)
BUN: 13 mg/dL (ref 6–20)
CO2: 27 mmol/L (ref 22–32)
Calcium: 8.9 mg/dL (ref 8.9–10.3)
Chloride: 103 mmol/L (ref 98–111)
Creatinine, Ser: 0.82 mg/dL (ref 0.44–1.00)
GFR, Estimated: >60 mL/min (ref >60)
Glucose, Bld: 323 mg/dL — ABNORMAL HIGH (ref 70–99)
Potassium: 3.5 mmol/L (ref 3.5–5.1)
Sodium: 138 mmol/L (ref 135–145)
Total Bilirubin: 0.6 mg/dL (ref 0.3–1.2)
Total Protein: 7.7 g/dL (ref 6.5–8.1)

## 2022-03-25 LAB — CBC WITH DIFFERENTIAL/PLATELET
Abs Immature Granulocytes: 0.04 10*3/uL (ref 0.00–0.07)
Basophils Absolute: 0.1 10*3/uL (ref 0.0–0.1)
Basophils Relative: 1 %
Eosinophils Absolute: 0.3 10*3/uL (ref 0.0–0.5)
Eosinophils Relative: 3 %
HCT: 36.4 % (ref 36.0–46.0)
Hemoglobin: 11.1 g/dL — ABNORMAL LOW (ref 12.0–15.0)
Immature Granulocytes: 0 %
Lymphocytes Relative: 36 %
Lymphs Abs: 3.9 10*3/uL (ref 0.7–4.0)
MCH: 23.8 pg — ABNORMAL LOW (ref 26.0–34.0)
MCHC: 30.5 g/dL (ref 30.0–36.0)
MCV: 78.1 fL — ABNORMAL LOW (ref 80.0–100.0)
Monocytes Absolute: 0.7 10*3/uL (ref 0.1–1.0)
Monocytes Relative: 6 %
Neutro Abs: 5.7 10*3/uL (ref 1.7–7.7)
Neutrophils Relative %: 54 %
Platelets: 279 10*3/uL (ref 150–400)
RBC: 4.66 MIL/uL (ref 3.87–5.11)
RDW: 15.6 % — ABNORMAL HIGH (ref 11.5–15.5)
WBC: 10.7 10*3/uL — ABNORMAL HIGH (ref 4.0–10.5)
nRBC: 0 % (ref 0.0–0.2)

## 2022-03-25 LAB — CBG MONITORING, ED: Glucose-Capillary: 318 mg/dL — ABNORMAL HIGH (ref 70–99)

## 2022-03-25 LAB — I-STAT BETA HCG BLOOD, ED (MC, WL, AP ONLY): I-stat hCG, quantitative: <5 m[IU]/mL (ref <5)

## 2022-03-25 NOTE — ED Triage Notes (Signed)
Patient arrives ambulatory by POV c/o vaginal discharge and itching x 4-5 days. Patient requesting to have her blood sugar checked due to it might being high.

## 2022-03-25 NOTE — ED Provider Triage Note (Signed)
Emergency Medicine Provider Triage Evaluation Note  [name] , a 31 y.o. female  was evaluated in triage.  Pt complains of vaginal discharge, dysuria and vaginal itching.  Patient notes symptoms for the past 5 to 7 days.  Concern for possible STD and is requesting treatment for "everything."  Denies abdominal pain, nausea, vomiting, fever, back/flank pain..  Review of Systems  Positive: See above Negative:   Physical Exam  BP (!) 146/114 (BP Location: Left Arm)   Pulse (!) 104   Temp 98.3 F (36.8 C) (Oral)   Resp 18   Ht '5\' 7"'$  (1.702 m)   Wt 113.4 kg   SpO2 99%   BMI 39.16 kg/m  Gen:   Awake, no distress   Resp:  Normal effort  MSK:   Moves extremities without difficulty  Other:  No abdominal tenderness palpation.  No CVA tenderness bilaterally.  Medical Decision Making  Medically screening exam initiated at 6:18 PM.  Appropriate orders placed.  [name] was informed that the remainder of the evaluation will be completed by another provider, this initial triage assessment does not replace that evaluation, and the importance of remaining in the ED until their evaluation is complete.     [name], [place] 03/25/22 1818

## 2022-03-26 ENCOUNTER — Emergency Department (HOSPITAL_COMMUNITY)
Admission: EM | Admit: 2022-03-26 | Discharge: 2022-03-26 | Discharge status: Against Medical Advice | Payer: Medicaid Other | Attending: Emergency Medicine | Admitting: Emergency Medicine

## 2022-03-26 ENCOUNTER — Encounter (HOSPITAL_COMMUNITY): Payer: Self-pay | Admitting: Emergency Medicine

## 2022-03-26 ENCOUNTER — Ambulatory Visit (HOSPITAL_COMMUNITY)
Admission: EM | Admit: 2022-03-26 | Discharge: 2022-03-26 | Disposition: A | Discharge status: Home / Self Care | Payer: Medicaid Other | Attending: Internal Medicine | Admitting: Internal Medicine

## 2022-03-26 ENCOUNTER — Encounter (HOSPITAL_COMMUNITY): Payer: Self-pay | Admitting: *Deleted

## 2022-03-26 DIAGNOSIS — R300 Dysuria: Secondary | ICD-10-CM | POA: Diagnosis not present

## 2022-03-26 DIAGNOSIS — Z5321 Procedure and treatment not carried out due to patient leaving prior to being seen by health care provider: Secondary | ICD-10-CM | POA: Insufficient documentation

## 2022-03-26 DIAGNOSIS — B3731 Acute candidiasis of vulva and vagina: Secondary | ICD-10-CM | POA: Diagnosis not present

## 2022-03-26 DIAGNOSIS — N898 Other specified noninflammatory disorders of vagina: Secondary | ICD-10-CM | POA: Insufficient documentation

## 2022-03-26 LAB — CBG MONITORING, ED: Glucose-Capillary: 349 mg/dL — ABNORMAL HIGH (ref 70–99)

## 2022-03-26 LAB — POCT URINALYSIS DIPSTICK, ED / UC
Bilirubin Urine: NEGATIVE
Glucose, UA: >=1000 mg/dL — AB
Ketones, ur: NEGATIVE mg/dL
Leukocytes,Ua: NEGATIVE
Nitrite: NEGATIVE
Protein, ur: 30 mg/dL — AB
Specific Gravity, Urine: >=1.03 (ref 1.005–1.030)
Urobilinogen, UA: 0.2 mg/dL (ref 0.0–1.0)
pH: 5 (ref 5.0–8.0)

## 2022-03-26 LAB — HIV ANTIBODY (ROUTINE TESTING W REFLEX): HIV Screen 4th Generation wRfx: NONREACTIVE

## 2022-03-26 MED ORDER — MONISTAT 1 COMBO PACK 1200 & 2 MG & % VA KIT: 1.0000 | PACK | Freq: Once | VAGINAL | 2 refills | Status: DC

## 2022-03-26 MED ORDER — FLUCONAZOLE 150 MG PO TABS: 150.0000 mg | ORAL_TABLET | Freq: Every day | ORAL | 0 refills | Status: DC

## 2022-03-26 NOTE — ED Provider Notes (Addendum)
[place] [facility]    CSN: [id number] Arrival date & time: 03/26/22  1444      History   Chief Complaint Chief Complaint  Patient presents with   Vaginal Discharge   Vaginal Itching    HPI [name] is a 31 y.o. female who presents with recurrent white chunkie vaginal discharge and itching and swelling on her L labia x 1 week. She also burns when she voids. Denies being sexually active. She was treated for yeast and BV a few weeks ago and her symptoms resolved. She admits her glucose have been high due to being a diabetic.     Past Medical History:  Diagnosis Date   Anxiety    Bipolar 1 disorder ([place])    Cognitive deficits    Depression    Diabetes mellitus without complication ([place])    Hypertension    Mental disorder    Mental health disorder    Obesity     Patient Active Problem List   Diagnosis Date Noted   Suicidal ideations 01/04/2022   Suicidal overdose ([place]) 03/23/2021   Syphilis 07/15/2020   Malingering 06/05/2020   Gastroesophageal reflux disease 05/04/2020   Hyperglycemia due to type 2 diabetes mellitus ([place]) 05/04/2020   Long term (current) use of insulin ([place]) 05/04/2020   Migraine without aura 05/04/2020   Morbid obesity ([place]) 05/04/2020   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 2 diabetes mellitus ([place]) 05/04/2020   Prolapsed internal hemorrhoids 05/04/2020   Vitamin D deficiency 05/04/2020   Other symptoms and signs involving cognitive functions and awareness 05/04/2020   Suicide attempt ([place])    Anxiety state 03/06/2020   Schizophrenia ([place]) 09/13/2019   Bipolar I disorder, most recent episode depressed ([place]) 06/23/2019   MDD (major depressive disorder) 10/10/2018   Schizoaffective disorder, bipolar type ([place]) 09/25/2018   Affective psychosis, bipolar ([place]) 06/13/2018   HTN (hypertension) 05/03/2018   Tobacco use disorder 05/03/2018   Adjustment disorder with emotional disturbance 01/02/2018   Schizophrenia, 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    Moderate bipolar I disorder, most recent episode depressed ([place])    Schizoaffective disorder ([place])    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/2017   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   OCD (obsessive compulsive disorder) 10/05/2016   Major depressive disorder, recurrent episode, mild ([place]) 05/04/2016   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   Learning disability 07/18/2015   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/2015   Diabetes mellitus ([place]) 07/18/2015   MDD (major depressive disorder), recurrent, severe, 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   Hyperlipidemia 07/18/2015   Severe episode of recurrent major depressive disorder, without psychotic features ([place])    Suicidal ideation    Intentional overdose ([place])    Cognitive deficits 10/12/2012   Generalized anxiety disorder 06/28/2012    Past Surgical History:  Procedure Laterality Date   CESAREAN SECTION     CESAREAN SECTION N/A 04/25/2013   Procedure: REPEAT CESAREAN SECTION;  Surgeon: [name], MD;  Location: [facility];  Service: Obstetrics;  Laterality: N/A;   MASS EXCISION N/A 06/03/2012   Procedure: EXCISION MASS;  Surgeon: [name], MD;  Location: [facility];  Service: ENT;  Laterality: N/A;  Excision uvula mass   TONSILLECTOMY N/A 06/03/2012   Procedure: TONSILLECTOMY;  Surgeon: [name], MD;  Location: [facility];  Service: ENT;  Laterality: N/A;   TONSILLECTOMY      OB History     Gravida  3   Para  3   Term  3  Preterm  0   AB  0   Living  3      SAB  0   IAB  0   Ectopic  0   Multiple      Live Births  3            Home Medications    Prior to Admission medications   Medication Sig Start Date End Date Taking? Authorizing Provider  benztropine (COGENTIN) 0.5 MG tablet Take 0.5 mg by mouth 2 (two) times daily.   Yes [provider]  Cholecalciferol (VITAMIN D-3 PO) Take 1 tablet by mouth daily.   Yes [provider]  gabapentin (NEURONTIN) 100 MG capsule Take 100 mg by mouth 3 (three) times daily.   Yes [provider]  haloperidol (HALDOL) 10 MG tablet Take 10 mg by mouth at bedtime. Take with 75m tablet for 174mdose.   Yes [provider]  haloperidol (HALDOL) 5 MG tablet Take 5 mg by mouth at bedtime. Take with 1070mablet for 58m78mse.   Yes [provider]  haloperidol decanoate (HALDOL DECANOATE) 100 MG/ML injection Inject 150 mg into the muscle every 30 (thirty) days.   Yes [provider]  insulin detemir (LEVEMIR) 100 UNIT/ML injection Inject 0.4 mLs (40 Units total) into the skin 2 (two) times daily. 10/14/21  Yes OnuoCharmaine DownsNP  lithium carbonate (ESKALITH) 450 MG CR tablet Take 450 mg by mouth at bedtime.   Yes [provider]  metoprolol tartrate (LOPRESSOR) 25 MG tablet Take 12.5 mg by mouth 2 (two) times daily.   Yes [provider]  miconazole (MONISTAT 1 COMBO PACK) kit Place 1 each vaginally once for 1 dose. One applicator per week for prevention of yeast infection 03/08/23 03/08/23 Yes Rodriguez-Southworth, SylvSunday Spillers-C  nystatin cream (MYCOSTATIN) Apply 1 Application topically 2 (two) times daily. Applying vaginally 01/07/22  Yes [provider]  fluconazole (DIFLUCAN) 150 MG tablet Take 1 tablet (150 mg total) by mouth daily. One qd x 3 days, then repeat again the same way next week 03/26/22   Rodriguez-Southworth, SylvSunday Spillers-C    Family History Family History  Problem Relation Age of Onset   Hypertension Mother    Diabetes Father     Social History Social History   Tobacco Use   Smoking status: Every Day    Types: Cigars   Smokeless tobacco: Never   Tobacco comments:    Pt declined  Vaping Use   Vaping Use: Never used  Substance Use Topics   Alcohol use: Not Currently   Drug use: Not Currently    Types: "Crack" cocaine, Other-see comments    Comment: Patient reports hx of smoking Crack      Allergies   Wellbutrin [bupropion], Omnipaque [iohexol], Penicillins, Atarax [hydroxyzine], Contrast media [iodinated contrast media], and Cymbalta [duloxetine hcl]   Review of Systems Review of Systems  Genitourinary:  Positive for dysuria, frequency and vaginal discharge. Negative for urgency.       + vaginal itching     Physical Exam Triage Vital Signs ED Triage Vitals  Enc Vitals Group     BP 03/26/22 1651 (!) 141/99     Pulse Rate 03/26/22 1651 (!) 105     Resp 03/26/22 1651 18     Temp 03/26/22 1651 98.1 F (36.7 C)     Temp Source 03/26/22 1651 Oral     SpO2 03/26/22 1651 97 %     Weight --  Height --      Head Circumference --      Peak Flow --      Pain Score 03/26/22 1647 10     Pain Loc --      Pain Edu? --      Excl. in [place]? --    No data found.  Updated Vital Signs BP (!) 141/99 (BP Location: Right Arm)   Pulse (!) 105   Temp 98.1 F (36.7 C) (Oral)   Resp 18   LMP 03/05/2022 (Approximate)   SpO2 97%   Visual Acuity Right Eye Distance:   Left Eye Distance:   Bilateral Distance:    Right Eye Near:   Left Eye Near:    Bilateral Near:     Physical Exam Vitals and nursing note reviewed.  Constitutional:      General: She is not in acute distress.    Appearance: She is obese. She is not toxic-appearing.  Eyes:     General: No scleral icterus.    Conjunctiva/sclera: Conjunctivae normal.  Genitourinary:    Comments: L lower labia majora is a little red and swollen, but there are no  lesions. The discharge is white and chunkie  Musculoskeletal:     Cervical back: Neck supple.  Skin:    General: Skin is warm and dry.  Neurological:     Mental Status: She is alert.  Psychiatric:        Mood and Affect: Mood normal.        Behavior: Behavior normal.        Thought Content: Thought content normal.      UC Treatments / Results  Labs (all labs ordered are listed, but only abnormal results are displayed) Labs Reviewed  POCT  URINALYSIS DIPSTICK, ED / UC - Abnormal; Notable for the following components:      Result Value   Glucose, UA >=1000 (*)    Hgb urine dipstick SMALL (*)    Protein, ur 30 (*)    All other components within normal limits    EKG   Radiology No results found.  Procedures Procedures (including critical care time)  Medications Ordered in UC Medications - No data to display  Initial Impression / Assessment and Plan / UC Course  I have reviewed the triage vital signs and the nursing notes.  Pertinent labs  results that were available during my care of the patient were reviewed by me and considered in my medical decision making (see chart for details).     Final Clinical Impressions(s) / UC Diagnoses   Final diagnoses:  Vaginal yeast infection  Vaginal itching     Discharge Instructions      You keep on getting recurrent yeast infections due to your sugars being high. You need to work on getting that better controlled and avoid eating or drinking sweet things.  If using the vaginal cream helps, then ask your primary care or gynecologist to continue refilling it.      ED Prescriptions     Medication Sig Dispense Auth. Provider   fluconazole (DIFLUCAN) 150 MG tablet Take 1 tablet (150 mg total) by mouth daily. One qd x 3 days, then repeat again the same way next week 6 tablet Rodriguez-Southworth, Sunday Spillers, PA-C   miconazole (MONISTAT 1 COMBO PACK) kit Place 1 each vaginally once for 1 dose. One applicator per week for prevention of yeast infection 1 each Rodriguez-Southworth, Sunday Spillers, PA-C      PDMP not reviewed this encounter.  Shelby Mattocks, PA-C 03/26/22 1732    Rodriguez-Southworth, Sunday Spillers, PA-C 03/26/22 1734

## 2022-03-26 NOTE — ED Triage Notes (Signed)
Pt states she has vaginal discharge and burning X 1 week. She doesn't have any known STI exposures. She does complain of burning when she urinates. She hasn't used any meds. She said she is not sexually active her boyfriend is in jail.   Pt states she can't read so she doesn't know her meds she just knows that she finished the last meds that she was given for BV and yeast.

## 2022-03-26 NOTE — Discharge Instructions (Addendum)
You keep on getting recurrent yeast infections due to your sugars being high. You need to work on getting that better controlled and avoid eating or drinking sweet things.  If using the vaginal cream helps, then ask your primary care or gynecologist to continue refilling it.

## 2022-03-26 NOTE — ED Provider Triage Note (Signed)
Emergency Medicine Provider Triage Evaluation Note  [name] , a 31 y.o. female  was evaluated in triage.  Pt complains of 1 week of white vag DC. Similar to prior MSE note.    Review of Systems  Positive: Vag DC Negative: Fever, abd pain  Physical Exam  BP (!) 143/103 (BP Location: Right Arm)   Pulse (!) 111   Temp 98.6 F (37 C) (Oral)   Resp 16   Ht '5\' 7"'$  (1.702 m)   Wt 114 kg   SpO2 98%   BMI 39.36 kg/m  Gen:   Awake, no distress   Resp:  Normal effort  MSK:   Moves extremities without difficulty  Other:  Abd soft NTTP.   Medical Decision Making  Medically screening exam initiated at 2:04 PM.  Appropriate orders placed.  [name] was informed that the remainder of the evaluation will be completed by another provider, this initial triage assessment does not replace that evaluation, and the importance of remaining in the ED until their evaluation is complete.  Labs done yesterday.  Neg preg.   Swabs ordered.    [name], [place] 03/26/22 1406

## 2022-03-26 NOTE — ED Triage Notes (Signed)
Pt reports vaginal discharge, itching and burning for a week.

## 2022-03-26 NOTE — ED Notes (Signed)
This patient informed this EMT that she was leaving to be seen at another facility

## 2022-03-27 LAB — CYTOLOGY, (ORAL, ANAL, URETHRAL) ANCILLARY ONLY
Bacterial Vaginitis (gardnerella): NEGATIVE
Candida Glabrata: NEGATIVE
Candida Vaginitis: POSITIVE — AB
Chlamydia: NEGATIVE
Comment: NEGATIVE
Comment: NEGATIVE
Comment: NEGATIVE
Comment: NEGATIVE
Comment: NEGATIVE
Comment: NORMAL
Neisseria Gonorrhea: NEGATIVE
Trichomonas: NEGATIVE

## 2022-03-30 ENCOUNTER — Ambulatory Visit: Payer: Medicaid Other | Admitting: Podiatry

## 2022-04-06 ENCOUNTER — Encounter (HOSPITAL_COMMUNITY): Payer: Self-pay

## 2022-04-06 ENCOUNTER — Other Ambulatory Visit: Payer: Self-pay

## 2022-04-06 ENCOUNTER — Emergency Department (HOSPITAL_COMMUNITY)
Admission: EM | Admit: 2022-04-06 | Discharge: 2022-04-07 | Disposition: A | Discharge status: Home / Self Care | Payer: Medicaid Other | Attending: Emergency Medicine | Admitting: Emergency Medicine

## 2022-04-06 DIAGNOSIS — F23 Brief psychotic disorder: Secondary | ICD-10-CM

## 2022-04-06 DIAGNOSIS — Z794 Long term (current) use of insulin: Secondary | ICD-10-CM | POA: Insufficient documentation

## 2022-04-06 LAB — COMPREHENSIVE METABOLIC PANEL WITH GFR
ALT: 21 U/L (ref 0–44)
AST: 28 U/L (ref 15–41)
Albumin: 4.1 g/dL (ref 3.5–5.0)
Alkaline Phosphatase: 67 U/L (ref 38–126)
Anion gap: 8 (ref 5–15)
BUN: 9 mg/dL (ref 6–20)
CO2: 25 mmol/L (ref 22–32)
Calcium: 9.5 mg/dL (ref 8.9–10.3)
Chloride: 105 mmol/L (ref 98–111)
Creatinine, Ser: 0.84 mg/dL (ref 0.44–1.00)
GFR, Estimated: >60 mL/min
Glucose, Bld: 243 mg/dL — ABNORMAL HIGH (ref 70–99)
Potassium: 3.8 mmol/L (ref 3.5–5.1)
Sodium: 138 mmol/L (ref 135–145)
Total Bilirubin: 0.6 mg/dL (ref 0.3–1.2)
Total Protein: 8 g/dL (ref 6.5–8.1)

## 2022-04-06 LAB — RAPID URINE DRUG SCREEN, HOSP PERFORMED
Amphetamines: NOT DETECTED
Barbiturates: NOT DETECTED
Benzodiazepines: NOT DETECTED
Cocaine: NOT DETECTED
Opiates: NOT DETECTED
Tetrahydrocannabinol: NOT DETECTED

## 2022-04-06 LAB — I-STAT BETA HCG BLOOD, ED (MC, WL, AP ONLY): I-stat hCG, quantitative: <5 m[IU]/mL (ref <5)

## 2022-04-06 LAB — CBC WITH DIFFERENTIAL/PLATELET
Abs Immature Granulocytes: 0.03 10*3/uL (ref 0.00–0.07)
Basophils Absolute: 0.1 10*3/uL (ref 0.0–0.1)
Basophils Relative: 1 %
Eosinophils Absolute: 0.3 10*3/uL (ref 0.0–0.5)
Eosinophils Relative: 3 %
HCT: 36.2 % (ref 36.0–46.0)
Hemoglobin: 11.2 g/dL — ABNORMAL LOW (ref 12.0–15.0)
Immature Granulocytes: 0 %
Lymphocytes Relative: 35 %
Lymphs Abs: 3.7 10*3/uL (ref 0.7–4.0)
MCH: 24 pg — ABNORMAL LOW (ref 26.0–34.0)
MCHC: 30.9 g/dL (ref 30.0–36.0)
MCV: 77.7 fL — ABNORMAL LOW (ref 80.0–100.0)
Monocytes Absolute: 0.6 10*3/uL (ref 0.1–1.0)
Monocytes Relative: 5 %
Neutro Abs: 6 10*3/uL (ref 1.7–7.7)
Neutrophils Relative %: 56 %
Platelets: 259 10*3/uL (ref 150–400)
RBC: 4.66 MIL/uL (ref 3.87–5.11)
RDW: 15.3 % (ref 11.5–15.5)
WBC: 10.7 10*3/uL — ABNORMAL HIGH (ref 4.0–10.5)
nRBC: 0 % (ref 0.0–0.2)

## 2022-04-06 LAB — ETHANOL: Alcohol, Ethyl (B): <10 mg/dL

## 2022-04-06 MED ORDER — ZIPRASIDONE MESYLATE 20 MG IM SOLR
10.0000 mg | Freq: Once | INTRAMUSCULAR | Status: AC
  Administered 2022-04-06: 10 mg via INTRAMUSCULAR
  Filled 2022-04-06: qty 20

## 2022-04-06 MED ORDER — HALOPERIDOL LACTATE 5 MG/ML IJ SOLN: 10.0000 mg | Freq: Once | INTRAMUSCULAR | Status: DC

## 2022-04-06 MED ORDER — METOCLOPRAMIDE HCL 10 MG PO TABS: 20.0000 mg | ORAL_TABLET | Freq: Once | ORAL | Status: DC

## 2022-04-06 MED ORDER — STERILE WATER FOR INJECTION IJ SOLN
INTRAMUSCULAR | Status: AC
  Filled 2022-04-06: qty 10

## 2022-04-06 MED ORDER — ZIPRASIDONE MESYLATE 20 MG IM SOLR: 10.0000 mg | Freq: Once | INTRAMUSCULAR | Status: DC

## 2022-04-06 NOTE — ED Triage Notes (Signed)
Pt BIB EMS with the need of a psychiatric evaluation. Pt reports needing medications. Pt is talking loudly and taking her wig off in triage.

## 2022-04-06 NOTE — ED Provider Notes (Addendum)
[name] DEPT Provider Note   CSN: [id number] Arrival date & time: 04/06/22  1909     History  Chief Complaint  Patient presents with   Psychiatric Evaluation    [name] is a 31 y.o. female with a past medical history of learning disability, depression, impulse control disorder and schizoaffective disorder presenting today because "they messed my medicine."  Denies AVH, drug use, alcohol use and says that she has been taking her medications but she is acutely psychotic in the triage area.  HPI     Home Medications Prior to Admission medications   Medication Sig Start Date End Date Taking? Authorizing Provider  benztropine (COGENTIN) 0.5 MG tablet Take 0.5 mg by mouth 2 (two) times daily.    [provider]  Cholecalciferol (VITAMIN D-3 PO) Take 1 tablet by mouth daily.    [provider]  fluconazole (DIFLUCAN) 150 MG tablet Take 1 tablet (150 mg total) by mouth daily. One qd x 3 days, then repeat again the same way next week 03/26/22   Rodriguez-Southworth, Sunday Spillers, PA-C  gabapentin (NEURONTIN) 100 MG capsule Take 100 mg by mouth 3 (three) times daily.    [provider]  haloperidol (HALDOL) 10 MG tablet Take 10 mg by mouth at bedtime. Take with 65m tablet for 189mdose.    [provider]  haloperidol (HALDOL) 5 MG tablet Take 5 mg by mouth at bedtime. Take with 1043mablet for 51m36mse.    [provider]  haloperidol decanoate (HALDOL DECANOATE) 100 MG/ML injection Inject 150 mg into the muscle every 30 (thirty) days.    [provider]  insulin detemir (LEVEMIR) 100 UNIT/ML injection Inject 0.4 mLs (40 Units total) into the skin 2 (two) times daily. 10/14/21   OnuoDelfin Gant  lithium carbonate (ESKALITH) 450 MG CR tablet Take 450 mg by mouth at bedtime.    [provider]  metoprolol tartrate (LOPRESSOR) 25 MG tablet Take 12.5 mg by mouth 2 (two) times daily.     [provider]  miconazole (MONISTAT 1 COMBO PACK) kit Place 1 each vaginally once for 1 dose. One applicator per week for prevention of yeast infection 03/08/23 03/08/23  Rodriguez-Southworth, SylvSunday Spillers-C  nystatin cream (MYCOSTATIN) Apply 1 Application topically 2 (two) times daily. Applying vaginally 01/07/22   [provider]      Allergies    Wellbutrin [bupropion], Omnipaque [iohexol], Penicillins, Atarax [hydroxyzine], Contrast media [iodinated contrast media], and Cymbalta [duloxetine hcl]    Review of Systems   Review of Systems  Physical Exam Updated Vital Signs BP (!) 184/112 Comment: actively yelling and moving around  Pulse (!) 132   Temp 98.5 F (36.9 C) (Oral)   Resp 19   Ht _0  (1.702 m)   Wt 51.7 kg   LMP 03/05/2022 (Approximate)   SpO2 100%   BMI 17.85 kg/m  Physical Exam Vitals and nursing note reviewed.  Constitutional:      Appearance: Normal appearance.  HENT:     Head: Normocephalic and atraumatic.  Eyes:     General: No scleral icterus.    Conjunctiva/sclera: Conjunctivae normal.  Pulmonary:     Effort: Pulmonary effort is normal. No respiratory distress.  Skin:    Findings: No rash.  Neurological:     Mental Status: She is alert.  Psychiatric:     Comments: Pressured speech, tangential.  Very impulsive, ambulating in the triage area and grabbing belongings that are not hers.  Acutely psychotic     ED Results / Procedures / Treatments   Labs (all labs ordered are listed, but only abnormal results are displayed) Labs Reviewed  COMPREHENSIVE METABOLIC PANEL - Abnormal; Notable for the following components:      Result Value   Glucose, Bld 243 (*)    All other components within normal limits  CBC WITH DIFFERENTIAL/PLATELET - Abnormal; Notable for the following components:   WBC 10.7 (*)    Hemoglobin 11.2 (*)    MCV 77.7 (*)    MCH 24.0 (*)    All other components within normal limits  ETHANOL  RAPID URINE DRUG  SCREEN, HOSP PERFORMED  I-STAT BETA HCG BLOOD, ED ([facility], [facility], [facility] ONLY)    EKG None  Radiology No results found.  Procedures Procedures   Medications Ordered in ED Medications  ziprasidone (GEODON) injection 10 mg (has no administration in time range)    ED Course/ Medical Decision Making/ A&P                           Medical Decision Making Amount and/or Complexity of Data Reviewed Labs: ordered.  Risk Prescription drug management.   31 year old female presenting today due to her medications being "messed up."  She believes that her dosing is wrong on her antipsychotics.  She is acutely psychotic in triage but does answer questions and is redirectable.  Medical clearance labs unremarkable.  Hyperglycemia is likely chronic, no signs of DKA/HHS.  Home medications will be ordered.  She is stable to be cleared by psych.   Final Clinical Impression(s) / ED Diagnoses Final diagnoses:  Acute psychosis [facility])    Rx / DC Orders ED Discharge Orders     None      Psych to [place], [place], PA-C 04/06/22 2201    Malvin Johns, MD 04/06/22 2304

## 2022-04-06 NOTE — ED Notes (Signed)
Pt calm, resting with blanket over her head

## 2022-04-07 NOTE — ED Provider Notes (Signed)
Emergency Medicine Observation Re-evaluation Note  Radha Raymond Azure is a 31 y.o. female, seen on rounds today.  Pt initially presented to the ED for complaints of Psychiatric Evaluation Currently, the patient is resting comfortable.  Physical Exam  BP 118/62 (BP Location: Right Arm)   Pulse 85   Temp 98 F (36.7 C) (Oral)   Resp 19   Ht '5\' 7"'$  (1.702 m)   Wt 51.7 kg   LMP 03/05/2022 (Approximate)   SpO2 98%   BMI 17.85 kg/m  Physical Exam General: Calm; resting Cardiac: Well perfused  Lungs: Even respirations Psych: Calm  ED Course / MDM  EKG:   I have reviewed the labs performed to date as well as medications administered while in observation.  Recent changes in the last 24 hours include evaluation by TTS.  Plan  Current plan is for discharge with ACT team to see today. Discussed with patient who is comfortable with plan for discharge. She will contact her ACT team as well. She is not under IVC. Denies SI/HI.     Margette Fast, MD 04/07/22 [id number]

## 2022-04-07 NOTE — ED Notes (Addendum)
Eating breakfast. Given purse and belongings from locker. States, "will call her ACT team".
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
## 2022-04-07 NOTE — Discharge Instructions (Signed)
Substance Abuse Treatment Programs ° °Intensive Outpatient Programs °[facility]     °[address]      °[place], [place]                   °[phone]      ° °[facility] °[address] °[place], [place] °[phone] ° ° Behavioral Health Outpatient     °(Inpatient and outpatient)     °[address]           °[phone]   ° °[facility] °[phone] (Suboxone and Methadone) ° °[address]      °[place], [place] [address]      °[phone]      ° °[address] °[place], [place] °852-3033 ° °[facility] (Outpatient/Inpatient, [facility])    °(insurance only) [phone]      °       °[facility]Groups & Residential) °[place], [place] °[phone] ° °   °[facility]     °[address]     °[place], [place]      °[phone]      ° °[facility] (for caregivers and family) °[address] °[place], [place] °336-299-4655 ° ° ° ° ° °Residential Treatment Programs °[facility]      °[address], [place], [place] [address]  °([phone]      ° °[facility] °[name] [place], [place] [address] °[phone] ° °[facility]        °[phone]      ° °[facility] °1-800-659-3381 ° °[facility]             °[address]                                         °[place], [place]                                                °877-615-2722 or [phone]                              ° °[facility] [place] °[address] °[place], [address] °[phone] ° °[facility]    °[address]      °[place], [place]     °[phone]      ° °[facility] °[address] °[place], [place] °[phone] ° °[facility]   °[address]     °[place], [place] [address]     °[phone]      °Admissions: 8am-3pm M-F ° °[facility] °[address] °[place],  [place] °[phone] ° °[facility]: Residential Program (90 Days)   °[place], [place]      °336-725-8389 or 800-758-6077    ° °[facility] °[place], [place] °(Walk in Hours over the weekend or by referral) ° °[facility] °[address], [place], [place] [address] °([phone] ° °Crisis Mobile: [facility]:  1-877-626-1772 (for crisis response 24 hours a day) °[facility] Hotline:      1-800-256-2452 °Outpatient Psychiatry and Counseling ° °[facility]: Mobile Crisis   Management 24 hours:  1-877-626-1772 ° °[facility] sliding scale fee and walk in schedule: M-F 8am-12pm/1pm-3pm °[address]  °[place], [place] [address] °[phone] ° °[facility] °[address] °[place], [place] [address] °[phone] ° °[facility] ([facility] known [facility])- new patient walk-in appointments available Monday - Friday 8am -3pm.          °[address] °[place], [place] [address] °336-676-6840 or crisis line- 336-676-6905 ° °[facility] Intensive Outpatient Therapy Program °[address] °[place], [place] [address] °336-832-9804 ° °[facility]                  °Crisis Services      °[phone]      °[address]     °[place], [place] [address]                ° °[facility]   °[facility] °[phone] °[address] °[place], [place] [address] ° ° °[facility]          °[address]  °[place], [place] [address]       °([phone] ° °[facility] °[address] °[place], [place] [address] °336-292-1510 ° °[facility]    °[address]    °[place], [place] [address]     °[phone]      ° °Parish McKinney, MD     °[address]     °[place] [address]     °[phone]     °  °[facility] °[address] °[place] [address] ° °[facility]     °[address]     °[place], [place]      °336-542-2076      ° °[facility] °Shamsher Ahluwalia, MD °[address] °[place], [place] [address] °[phone] ° °[facility]     °[address]     °[place], [place] [address]     °336-274-1237      ° °[facility] °[address] °[place], [place] [address] °336-854-4450 °Resources for Temporary Residential Assistance/Crisis Centers ° °DAY CENTERS °[facility] °M-F 8am-3pm   °[address] [place], [place] [address]   336-332-0824 °Services include: laundry, barbering, support groups, case management, phone  & computer access, showers, AA/NA mtgs, mental health/substance abuse nurse, job skills class, disability information, VA assistance, spiritual classes, etc.  ° °HOMELESS SHELTERS ° °[facility]     °[facility]   °[address], [place]     °[phone]       °       °[facility] (women and children)       °[address] °[place], [place] [address] °336-275-0820 °[email or web address] for application and process °Application Required ° °[facility]   °[address]    °[place] [address]     °[phone]       °             °[facility] °[address] °,  [address] °336.273.5572 °336-235-0363(schedule application appt.) °Application Required ° °[facility] (women only)    °[address]     °[place],  [address]     °336-884-1039      °  Intake starts 6pm daily °Need valid ID, SSC, & Police report °[facility] [place] °[address] °[place], [place] °336-881-5420 °Application Required ° °[facility] (men only)     °[address]      °[place], [place]     °[phone]      ° °[facility] °(Pregnant women only) °[address] °[place], [place] °336-275-0206 ° °[facility]      °[address]      °[place], [place] [address]     °[phone]      °       °[facility] °[address] °[place], [place] °336-723-1848 °90 day commitment/SA/Application process ° °[facility](men only)     °[address]     °[place], [place]     °[phone]       °Check-in at 7pm     °       °[facility] °[address] °[place], [place] [address] °336-248-6684 °Men/Women/Women and Children must be there by 7 pm ° °[facility] °[place], [place] °336-722-8721                ° °

## 2022-04-07 NOTE — ED Notes (Signed)
MD in to see

## 2022-04-07 NOTE — ED Notes (Signed)
Patient slept throughout the night.

## 2022-04-07 NOTE — ED Notes (Signed)
Pt alert, NAD, calm, interactive, denies sx or complaints, questions or needs. "Slept well", "ready to eat", breakfast given. VSS. D/c'd.

## 2022-04-09 ENCOUNTER — Ambulatory Visit (HOSPITAL_COMMUNITY)
Admission: EM | Admit: 2022-04-09 | Discharge: 2022-04-10 | Disposition: A | Discharge status: Other Acute Inpatient Hospital | Payer: Medicaid Other | Attending: Psychiatry | Admitting: Psychiatry

## 2022-04-09 DIAGNOSIS — F32A Depression, unspecified: Secondary | ICD-10-CM

## 2022-04-09 DIAGNOSIS — F251 Schizoaffective disorder, depressive type: Secondary | ICD-10-CM | POA: Insufficient documentation

## 2022-04-09 DIAGNOSIS — R45851 Suicidal ideations: Secondary | ICD-10-CM | POA: Diagnosis not present

## 2022-04-09 DIAGNOSIS — Z1152 Encounter for screening for COVID-19: Secondary | ICD-10-CM | POA: Insufficient documentation

## 2022-04-09 LAB — POCT URINE DRUG SCREEN - MANUAL ENTRY (I-SCREEN)
POC Amphetamine UR: NOT DETECTED
POC Buprenorphine (BUP): NOT DETECTED
POC Cocaine UR: NOT DETECTED
POC Marijuana UR: NOT DETECTED
POC Methadone UR: NOT DETECTED
POC Methamphetamine UR: NOT DETECTED
POC Morphine: NOT DETECTED
POC Oxazepam (BZO): NOT DETECTED
POC Oxycodone UR: NOT DETECTED
POC Secobarbital (BAR): NOT DETECTED

## 2022-04-09 LAB — GLUCOSE, CAPILLARY: Glucose-Capillary: 230 mg/dL — ABNORMAL HIGH (ref 70–99)

## 2022-04-09 LAB — POCT PREGNANCY, URINE: Preg Test, Ur: NEGATIVE

## 2022-04-09 MED ORDER — HALOPERIDOL 5 MG PO TABS
10.0000 mg | ORAL_TABLET | Freq: Every day | ORAL | Status: DC
  Administered 2022-04-10: 10 mg via ORAL
  Filled 2022-04-09: qty 2

## 2022-04-09 MED ORDER — BENZTROPINE MESYLATE 0.5 MG PO TABS
0.5000 mg | ORAL_TABLET | Freq: Two times a day (BID) | ORAL | Status: DC
  Administered 2022-04-10 (×2): 0.5 mg via ORAL
  Filled 2022-04-09 (×2): qty 1

## 2022-04-09 MED ORDER — ACETAMINOPHEN 325 MG PO TABS: 650.0000 mg | ORAL_TABLET | Freq: Four times a day (QID) | ORAL | Status: DC | PRN

## 2022-04-09 MED ORDER — ALUM & MAG HYDROXIDE-SIMETH 200-200-20 MG/5ML PO SUSP: 30.0000 mL | ORAL | Status: DC | PRN

## 2022-04-09 MED ORDER — INSULIN ASPART 100 UNIT/ML IJ SOLN
0.0000 [IU] | Freq: Three times a day (TID) | INTRAMUSCULAR | Status: DC
  Administered 2022-04-10 (×2): 3 [IU] via SUBCUTANEOUS

## 2022-04-09 MED ORDER — TRAZODONE HCL 50 MG PO TABS
50.0000 mg | ORAL_TABLET | Freq: Every evening | ORAL | Status: DC | PRN
  Administered 2022-04-10: 50 mg via ORAL
  Filled 2022-04-09: qty 1

## 2022-04-09 MED ORDER — METOPROLOL TARTRATE 25 MG PO TABS
12.5000 mg | ORAL_TABLET | Freq: Two times a day (BID) | ORAL | Status: DC
  Administered 2022-04-10 (×2): 12.5 mg via ORAL
  Filled 2022-04-09 (×2): qty 1

## 2022-04-09 MED ORDER — INSULIN ASPART 100 UNIT/ML IJ SOLN
0.0000 [IU] | Freq: Every day | INTRAMUSCULAR | Status: DC
  Administered 2022-04-10: 2 [IU] via SUBCUTANEOUS

## 2022-04-09 MED ORDER — MAGNESIUM HYDROXIDE 400 MG/5ML PO SUSP: 30.0000 mL | Freq: Every day | ORAL | Status: DC | PRN

## 2022-04-09 NOTE — ED Provider Notes (Signed)
[facility] Continuous Assessment Admission H&P  Date: 04/09/22 Patient Name: [name] MRN: [id number] Chief Complaint: "I am depressed and suicidal with a plan to overdose on my medication" Chief Complaint  Patient presents with   Suicidal      Diagnoses:  Final diagnoses:  Suicidal ideation  Depression, unspecified depression type    HPI: [name] is a 31 year old African-American female with history of Suicidal Ideation, Major depressive disorder, schizoaffective disorder bipolar type who presented voluntarily accompanied by GPD with complaints of depression and suicidal ideation with a plan to overdose on her medication.   Patient was seen face to face by this provider and chart reviewed.  Per chart review, patient presented to [facility] ED on 04/06/22 with the complaint that her medication was messed up. Patient was discharged on 04/07/22 to see her ACT team.   On evaluation patient is alert and oriented x4, speech is clear and coherent. Patient's eye contact is good, mood is depressed, affect is blunt. Patient's thought process is coherent and thought content is within normal limits. Patient denies Suicidal thoughts with a plan to overdose. Jadah denies HI, denies AVH, or paranoia. Patient says that she is not seeing demons but can feel them. There is no indication that the patient is responding to internal stimuli and no delusion noted.   Patient has an ACT team called [facility].   Patient reported that today she was having suicidal thoughts with a plan to overdose on her medication and she called the police to bring her here. She reported that she was not seeing any demons but can feel them. Patient states that she is depressed and sleeping too much and her appetite is good.  Patient says that she took all her medication earlier today, but was not able to say what medication she is taking or give details. Patient is a poor historian as regards her  home medication.   Support, encouragement and reassurance provided about ongoing stressors and patient provided with opportunity for questions.          PHQ 2-9:  [place] ED from 04/09/2022 in [facility] ED from 01/10/2022 in [facility] ED from 10/24/2021 in [facility]  Thoughts that you would be better off dead, or of hurting yourself in some way Several days More than half the days Several days  PHQ-9 Total Score _0 Flowsheet Row ED from 04/09/2022 in [facility] ED from 04/06/2022 in [facility] ED from 03/26/2022 in [facility] [place] No Risk Error: Question 6 not populated        Total Time spent with patient: 20 minutes  Musculoskeletal  Strength & Muscle Tone: within normal limits Gait & Station: normal Patient leans: N/A  Psychiatric Specialty Exam  Presentation General Appearance:  Appropriate for Environment  Eye Contact: Good  Speech: Normal Rate  Speech Volume: Normal  Handedness: Right   Mood and Affect  Mood: Depressed  Affect: Appropriate   Thought Process  Thought Processes: Coherent  Descriptions of Associations:Intact  Orientation:Full (Time, Place and Person)  Thought Content:WDL  Diagnosis of Schizophrenia or Schizoaffective disorder in past: Yes   Hallucinations:Hallucinations: None  Ideas of Reference:None  Suicidal Thoughts:Suicidal Thoughts: Yes, Active SI Active Intent and/or Plan: With Plan  Homicidal Thoughts:Homicidal Thoughts: No   Sensorium  Memory: Immediate Good; Recent Good; Remote Good  Judgment: Fair  Insight: Fair   Executive Functions  Concentration: Good  Attention Span: Good  Recall: Roel Cluck of Knowledge: Fair  Language: Good   Psychomotor Activity   Psychomotor Activity: Psychomotor Activity: Normal   Assets  Assets: Physical Health; Resilience; Communication Skills   Sleep  Sleep: Sleep: Good   Nutritional Assessment (For OBS and FBC admissions only) Has the patient had a weight loss or gain of 10 pounds or more in the last 3 months?: No Has the patient had a decrease in food intake/or appetite?: No Does the patient have dental problems?: No Does the patient have eating habits or behaviors that may be indicators of an eating disorder including binging or inducing vomiting?: No Has the patient recently lost weight without trying?: 0 Has the patient been eating poorly because of a decreased appetite?: 0 Malnutrition Screening Tool Score: 0    Physical Exam Constitutional:      Appearance: Normal appearance.  HENT:     Nose: No congestion.  Eyes:     General:        Right eye: No discharge.        Left eye: No discharge.  Cardiovascular:     Rate and Rhythm: Normal rate.  Pulmonary:     Effort: No respiratory distress.     Breath sounds: No wheezing.  Musculoskeletal:        General: No signs of injury.  Skin:    Findings: No bruising.  Neurological:     Mental Status: She is alert and oriented to person, place, and time.     Motor: No weakness.  Psychiatric:        Mood and Affect: Mood normal.        Speech: Speech normal.        Behavior: Behavior is cooperative.        Thought Content: Thought content is not paranoid or delusional. Thought content includes suicidal ideation. Thought content does not include homicidal ideation. Thought content includes suicidal plan. Thought content does not include homicidal plan.        Judgment: Judgment normal.    Review of Systems  Constitutional:  Negative for fever.  HENT:  Negative for ear discharge.   Eyes:  Negative for discharge.  Respiratory:  Negative for cough, shortness of breath and wheezing.   Gastrointestinal:  Negative for abdominal pain, diarrhea,  nausea and vomiting.  Neurological:  Negative for dizziness, tremors, seizures, weakness and headaches.  Psychiatric/Behavioral:  Positive for depression and suicidal ideas. Negative for hallucinations and substance abuse.     Last menstrual period 03/05/2022. There is no height or weight on file to calculate BMI.  Past Psychiatric History: See HPI   Is the patient at risk to self? Yes  Has the patient been a risk to self in the past 6 months? Yes .    Has the patient been a risk to self within the distant past? Yes   Is the patient a risk to others? No   Has the patient been a risk to others in the past 6 months? No   Has the patient been a risk to others within the distant past? No   Past Medical History:  Past Medical History:  Diagnosis Date   Anxiety    Bipolar 1 disorder ([place])    Cognitive deficits    Depression    Diabetes mellitus without complication ([place])    Hypertension  Mental disorder    Mental health disorder    Obesity     Past Surgical History:  Procedure Laterality Date   CESAREAN SECTION     CESAREAN SECTION N/A 04/25/2013   Procedure: REPEAT CESAREAN SECTION;  Surgeon: [name], MD;  Location: [facility];  Service: Obstetrics;  Laterality: N/A;   MASS EXCISION N/A 06/03/2012   Procedure: EXCISION MASS;  Surgeon: [name], MD;  Location: [facility];  Service: ENT;  Laterality: N/A;  Excision uvula mass   TONSILLECTOMY N/A 06/03/2012   Procedure: TONSILLECTOMY;  Surgeon: [name], MD;  Location: [facility];  Service: ENT;  Laterality: N/A;   TONSILLECTOMY      Family History:  Family History  Problem Relation Age of Onset   Hypertension Mother    Diabetes Father     Social History:  Social History   Socioeconomic History   Marital status: Single    Spouse name: Not on file   Number of children: Not on file   Years of education: Not on file   Highest education level: Not on file  Occupational History    Not on file  Tobacco Use   Smoking status: Every Day    Types: Cigars   Smokeless tobacco: Never   Tobacco comments:    Pt declined  Vaping Use   Vaping Use: Never used  Substance and Sexual Activity   Alcohol use: Not Currently   Drug use: Not Currently    Types: "Crack" cocaine, Other-see comments    Comment: Patient reports hx of smoking Crack   Sexual activity: Not Currently    Birth control/protection: None    Comment: occasionally  Other Topics Concern   Not on file  Social History Narrative   ** Merged History Encounter **       Social Determinants of Health   Financial Resource Strain: Not on file  Food Insecurity: Not on file  Transportation Needs: Not on file  Physical Activity: Not on file  Stress: Not on file  Social Connections: Not on file  Intimate Partner Violence: Not on file    SDOH:  SDOH Screenings   Alcohol Screen: Low Risk  (05/02/2020)  Depression (PHQ2-9): High Risk (04/09/2022)  Tobacco Use: High Risk (04/06/2022)    Last Labs:  Admission on 04/09/2022  Component Date Value Ref Range Status   POC Amphetamine UR 04/09/2022 None Detected  NONE DETECTED (Cut Off Level 1000 ng/mL) Final   POC Secobarbital (BAR) 04/09/2022 None Detected  NONE DETECTED (Cut Off Level 300 ng/mL) Final   POC Buprenorphine (BUP) 04/09/2022 None Detected  NONE DETECTED (Cut Off Level 10 ng/mL) Final   POC Oxazepam (BZO) 04/09/2022 None Detected  NONE DETECTED (Cut Off Level 300 ng/mL) Final   POC Cocaine UR 04/09/2022 None Detected  NONE DETECTED (Cut Off Level 300 ng/mL) Final   POC Methamphetamine UR 04/09/2022 None Detected  NONE DETECTED (Cut Off Level 1000 ng/mL) Final   POC Morphine 04/09/2022 None Detected  NONE DETECTED (Cut Off Level 300 ng/mL) Final   POC Methadone UR 04/09/2022 None Detected  NONE DETECTED (Cut Off Level 300 ng/mL) Final   POC Oxycodone UR 04/09/2022 None Detected  NONE DETECTED (Cut Off Level 100 ng/mL) Final   POC Marijuana UR  04/09/2022 None Detected  NONE DETECTED (Cut Off Level 50 ng/mL) Final   Preg Test, Ur 04/09/2022 NEGATIVE  NEGATIVE Final   Comment:        THE SENSITIVITY OF  THIS METHODOLOGY IS >24 mIU/mL   Admission on 04/06/2022, Discharged on 04/07/2022  Component Date Value Ref Range Status   Sodium 04/06/2022 138  135 - 145 mmol/L Final   Potassium 04/06/2022 3.8  3.5 - 5.1 mmol/L Final   Chloride 04/06/2022 105  98 - 111 mmol/L Final   CO2 04/06/2022 25  22 - 32 mmol/L Final   Glucose, Bld 04/06/2022 243 (H)  70 - 99 mg/dL Final   Glucose reference range applies only to samples taken after fasting for at least 8 hours.   BUN 04/06/2022 9  6 - 20 mg/dL Final   Creatinine, Ser 04/06/2022 0.84  0.44 - 1.00 mg/dL Final   Calcium 04/06/2022 9.5  8.9 - 10.3 mg/dL Final   Total Protein 04/06/2022 8.0  6.5 - 8.1 g/dL Final   Albumin 04/06/2022 4.1  3.5 - 5.0 g/dL Final   AST 04/06/2022 28  15 - 41 U/L Final   ALT 04/06/2022 21  0 - 44 U/L Final   Alkaline Phosphatase 04/06/2022 67  38 - 126 U/L Final   Total Bilirubin 04/06/2022 0.6  0.3 - 1.2 mg/dL Final   GFR, Estimated 04/06/2022 >60  >60 mL/min Final   Comment: (NOTE) Calculated using the CKD-EPI Creatinine Equation (2021)    Anion gap 04/06/2022 8  5 - 15 Final   Performed at [facility], [place] [address] [place], [place] [address]   Alcohol, Ethyl (B) 04/06/2022 <10  <10 mg/dL Final   Comment: (NOTE) Lowest detectable limit for serum alcohol is 10 mg/dL.  For medical purposes only. Performed at [facility], [place] [address] [place],  [address]    Opiates 04/06/2022 NONE DETECTED  NONE DETECTED Final   Cocaine 04/06/2022 NONE DETECTED  NONE DETECTED Final   Benzodiazepines 04/06/2022 NONE DETECTED  NONE DETECTED Final   Amphetamines 04/06/2022 NONE DETECTED  NONE DETECTED Final   Tetrahydrocannabinol 04/06/2022 NONE DETECTED  NONE DETECTED Final   Barbiturates 04/06/2022 NONE DETECTED  NONE  DETECTED Final   Comment: (NOTE) DRUG SCREEN FOR MEDICAL PURPOSES ONLY.  IF CONFIRMATION IS NEEDED FOR ANY PURPOSE, NOTIFY LAB WITHIN 5 DAYS.  LOWEST DETECTABLE LIMITS FOR URINE DRUG SCREEN Drug Class                     Cutoff (ng/mL) Amphetamine and metabolites    1000 Barbiturate and metabolites    200 Benzodiazepine                 200 Opiates and metabolites        300 Cocaine and metabolites        300 THC                            50 Performed at [facility], [place] [address] [place], [place] [address]    WBC 04/06/2022 10.7 (H)  4.0 - 10.5 K/uL Final   RBC 04/06/2022 4.66  3.87 - 5.11 MIL/uL Final   Hemoglobin 04/06/2022 11.2 (L)  12.0 - 15.0 g/dL Final   HCT 04/06/2022 36.2  36.0 - 46.0 % Final   MCV 04/06/2022 77.7 (L)  80.0 - 100.0 fL Final   MCH 04/06/2022 24.0 (L)  26.0 - 34.0 pg Final   MCHC 04/06/2022 30.9  30.0 - 36.0 g/dL Final   RDW 04/06/2022 15.3  11.5 - 15.5 % Final   Platelets 04/06/2022 259  150 -  400 K/uL Final   nRBC 04/06/2022 0.0  0.0 - 0.2 % Final   Neutrophils Relative % 04/06/2022 56  % Final   Neutro Abs 04/06/2022 6.0  1.7 - 7.7 K/uL Final   Lymphocytes Relative 04/06/2022 35  % Final   Lymphs Abs 04/06/2022 3.7  0.7 - 4.0 K/uL Final   Monocytes Relative 04/06/2022 5  % Final   Monocytes Absolute 04/06/2022 0.6  0.1 - 1.0 K/uL Final   Eosinophils Relative 04/06/2022 3  % Final   Eosinophils Absolute 04/06/2022 0.3  0.0 - 0.5 K/uL Final   Basophils Relative 04/06/2022 1  % Final   Basophils Absolute 04/06/2022 0.1  0.0 - 0.1 K/uL Final   Immature Granulocytes 04/06/2022 0  % Final   Abs Immature Granulocytes 04/06/2022 0.03  0.00 - 0.07 K/uL Final   Performed at [facility], [place] [address], [place], [place] [address]   I-stat hCG, quantitative 04/06/2022 <5.0  <5 mIU/mL Final   Comment 3 04/06/2022          Final   Comment:   GEST. AGE      CONC.  (mIU/mL)   <=1 WEEK        5 - 50     2 WEEKS       50  - 500     3 WEEKS       100 - 10,000     4 WEEKS     1,000 - 30,000        FEMALE AND NON-PREGNANT FEMALE:     LESS THAN 5 mIU/mL   Admission on 03/26/2022, Discharged on 03/26/2022  Component Date Value Ref Range Status   Glucose, UA 03/26/2022 >=1000 (A)  NEGATIVE mg/dL Final   Bilirubin Urine 03/26/2022 NEGATIVE  NEGATIVE Final   Ketones, ur 03/26/2022 NEGATIVE  NEGATIVE mg/dL Final   Specific Gravity, Urine 03/26/2022 >=1.030  1.005 - 1.030 Final   Hgb urine dipstick 03/26/2022 SMALL (A)  NEGATIVE Final   pH 03/26/2022 5.0  5.0 - 8.0 Final   Protein, ur 03/26/2022 30 (A)  NEGATIVE mg/dL Final   Urobilinogen, UA 03/26/2022 0.2  0.0 - 1.0 mg/dL Final   Nitrite 03/26/2022 NEGATIVE  NEGATIVE Final   Leukocytes,Ua 03/26/2022 NEGATIVE  NEGATIVE Final   Biochemical Testing Only. Please order routine urinalysis from main lab if confirmatory testing is needed.   Neisseria Gonorrhea 03/26/2022 Negative   Final   Chlamydia 03/26/2022 Negative   Final   Trichomonas 03/26/2022 Negative   Final   Bacterial Vaginitis (gardnerella) 03/26/2022 Negative   Final   Candida Vaginitis 03/26/2022 Positive (A)   Final   Candida Glabrata 03/26/2022 Negative   Final   Comment 03/26/2022 Normal Reference Range Bacterial Vaginosis - Negative   Final   Comment 03/26/2022 Normal Reference Range Candida Species - Negative   Final   Comment 03/26/2022 Normal Reference Range Candida Galbrata - Negative   Final   Comment 03/26/2022 Normal Reference Range Trichomonas - Negative   Final   Comment 03/26/2022 Normal Reference [place] Chlamydia - Negative   Final   Comment 03/26/2022 Normal Reference Range Neisseria Gonorrhea - Negative   Final  Admission on 03/26/2022, Discharged on 03/26/2022  Component Date Value Ref Range Status   Glucose-Capillary 03/26/2022 349 (H)  70 - 99 mg/dL Final   Glucose reference range applies only to samples taken after fasting for at least 8 hours.  Admission on 03/25/2022, Discharged  on 03/25/2022  Component Date Value  Ref Range Status   I-stat hCG, quantitative 03/25/2022 <5.0  <5 mIU/mL Final   Comment 3 03/25/2022          Final   Comment:   GEST. AGE      CONC.  (mIU/mL)   <=1 WEEK        5 - 50     2 WEEKS       50 - 500     3 WEEKS       100 - 10,000     4 WEEKS     1,000 - 30,000        FEMALE AND NON-PREGNANT FEMALE:     LESS THAN 5 mIU/mL    HIV Screen 4th Generation wRfx 03/25/2022 Non Reactive  Non Reactive Final   Performed at [facility], [place] [address], [place], [place] [address]   Glucose-Capillary 03/25/2022 318 (H)  70 - 99 mg/dL Final   Glucose reference range applies only to samples taken after fasting for at least 8 hours.   Sodium 03/25/2022 138  135 - 145 mmol/L Final   Potassium 03/25/2022 3.5  3.5 - 5.1 mmol/L Final   Chloride 03/25/2022 103  98 - 111 mmol/L Final   CO2 03/25/2022 27  22 - 32 mmol/L Final   Glucose, Bld 03/25/2022 323 (H)  70 - 99 mg/dL Final   Glucose reference range applies only to samples taken after fasting for at least 8 hours.   BUN 03/25/2022 13  6 - 20 mg/dL Final   Creatinine, Ser 03/25/2022 0.82  0.44 - 1.00 mg/dL Final   Calcium 03/25/2022 8.9  8.9 - 10.3 mg/dL Final   Total Protein 03/25/2022 7.7  6.5 - 8.1 g/dL Final   Albumin 03/25/2022 4.1  3.5 - 5.0 g/dL Final   AST 03/25/2022 22  15 - 41 U/L Final   ALT 03/25/2022 22  0 - 44 U/L Final   Alkaline Phosphatase 03/25/2022 67  38 - 126 U/L Final   Total Bilirubin 03/25/2022 0.6  0.3 - 1.2 mg/dL Final   GFR, Estimated 03/25/2022 >60  >60 mL/min Final   Comment: (NOTE) Calculated using the CKD-EPI Creatinine Equation (2021)    Anion gap 03/25/2022 8  5 - 15 Final   Performed at [facility], [place] [address], [place], [place] [address]   WBC 03/25/2022 10.7 (H)  4.0 - 10.5 K/uL Final   RBC 03/25/2022 4.66  3.87 - 5.11 MIL/uL Final   Hemoglobin 03/25/2022 11.1 (L)  12.0 - 15.0 g/dL Final   HCT 03/25/2022 36.4  36.0 - 46.0 % Final    MCV 03/25/2022 78.1 (L)  80.0 - 100.0 fL Final   MCH 03/25/2022 23.8 (L)  26.0 - 34.0 pg Final   MCHC 03/25/2022 30.5  30.0 - 36.0 g/dL Final   RDW 03/25/2022 15.6 (H)  11.5 - 15.5 % Final   Platelets 03/25/2022 279  150 - 400 K/uL Final   nRBC 03/25/2022 0.0  0.0 - 0.2 % Final   Neutrophils Relative % 03/25/2022 54  % Final   Neutro Abs 03/25/2022 5.7  1.7 - 7.7 K/uL Final   Lymphocytes Relative 03/25/2022 36  % Final   Lymphs Abs 03/25/2022 3.9  0.7 - 4.0 K/uL Final   Monocytes Relative 03/25/2022 6  % Final   Monocytes Absolute 03/25/2022 0.7  0.1 - 1.0 K/uL Final   Eosinophils Relative 03/25/2022 3  % Final   Eosinophils Absolute 03/25/2022 0.3  0.0 - 0.5 K/uL Final   Basophils Relative 03/25/2022 1  % Final   Basophils Absolute 03/25/2022 0.1  0.0 - 0.1 K/uL Final   Immature Granulocytes 03/25/2022 0  % Final   Abs Immature Granulocytes 03/25/2022 0.04  0.00 - 0.07 K/uL Final   Performed at [facility], [place] [address], [place], [place] [address]  Admission on 03/23/2022, Discharged on 03/23/2022  Component Date Value Ref Range Status   Glucose-Capillary 03/23/2022 349 (H)  70 - 99 mg/dL Final   Glucose reference range applies only to samples taken after fasting for at least 8 hours.   WBC 03/23/2022 11.3 (H)  4.0 - 10.5 K/uL Final   RBC 03/23/2022 4.44  3.87 - 5.11 MIL/uL Final   Hemoglobin 03/23/2022 10.8 (L)  12.0 - 15.0 g/dL Final   HCT 03/23/2022 34.6 (L)  36.0 - 46.0 % Final   MCV 03/23/2022 77.9 (L)  80.0 - 100.0 fL Final   MCH 03/23/2022 24.3 (L)  26.0 - 34.0 pg Final   MCHC 03/23/2022 31.2  30.0 - 36.0 g/dL Final   RDW 03/23/2022 15.4  11.5 - 15.5 % Final   Platelets 03/23/2022 277  150 - 400 K/uL Final   nRBC 03/23/2022 0.0  0.0 - 0.2 % Final   Performed at [facility], [place] [address], [place], [place] [address]   Sodium 03/23/2022 136  135 - 145 mmol/L Final   Potassium 03/23/2022 3.9  3.5 - 5.1 mmol/L Final   Chloride  03/23/2022 104  98 - 111 mmol/L Final   CO2 03/23/2022 22  22 - 32 mmol/L Final   Glucose, Bld 03/23/2022 337 (H)  70 - 99 mg/dL Final   Glucose reference range applies only to samples taken after fasting for at least 8 hours.   BUN 03/23/2022 12  6 - 20 mg/dL Final   Creatinine, Ser 03/23/2022 0.70  0.44 - 1.00 mg/dL Final   Calcium 03/23/2022 9.1  8.9 - 10.3 mg/dL Final   GFR, Estimated 03/23/2022 >60  >60 mL/min Final   Comment: (NOTE) Calculated using the CKD-EPI Creatinine Equation (2021)    Anion gap 03/23/2022 10  5 - 15 Final   Performed at [facility], [place] [address] [place], [place] [address]   Color, Urine 03/23/2022 STRAW (A)  YELLOW Final   APPearance 03/23/2022 CLEAR  CLEAR Final   Specific Gravity, Urine 03/23/2022 1.022  1.005 - 1.030 Final   pH 03/23/2022 6.0  5.0 - 8.0 Final   Glucose, UA 03/23/2022 >=500 (A)  NEGATIVE mg/dL Final   Hgb urine dipstick 03/23/2022 NEGATIVE  NEGATIVE Final   Bilirubin Urine 03/23/2022 NEGATIVE  NEGATIVE Final   Ketones, ur 03/23/2022 NEGATIVE  NEGATIVE mg/dL Final   Protein, ur 03/23/2022 NEGATIVE  NEGATIVE mg/dL Final   Nitrite 03/23/2022 NEGATIVE  NEGATIVE Final   Leukocytes,Ua 03/23/2022 NEGATIVE  NEGATIVE Final   Bacteria, UA 03/23/2022 NONE SEEN  NONE SEEN Final   Squamous Epithelial / LPF 03/23/2022 0-5  0 - 5 Final   Performed at [facility], [place] [address], [place], [place] [address]  Admission on 02/17/2022, Discharged on 02/17/2022  Component Date Value Ref Range Status   Neisseria Gonorrhea 02/17/2022 Negative   Final   Chlamydia 02/17/2022 Negative   Final   Trichomonas 02/17/2022 Negative   Final   Bacterial Vaginitis (gardnerella) 02/17/2022 Positive (A)   Final   Candida Vaginitis 02/17/2022 Positive (A)   Final  Candida Glabrata 02/17/2022 Negative   Final   Comment 02/17/2022 Normal Reference Range Bacterial Vaginosis - Negative   Final   Comment 02/17/2022 Normal Reference  Range Candida Species - Negative   Final   Comment 02/17/2022 Normal Reference Range Candida Galbrata - Negative   Final   Comment 02/17/2022 Normal Reference Range Trichomonas - Negative   Final   Comment 02/17/2022 Normal Reference Ranger Chlamydia - Negative   Final   Comment 02/17/2022 Normal Reference Range Neisseria Gonorrhea - Negative   Final  Admission on 02/11/2022, Discharged on 02/11/2022  Component Date Value Ref Range Status   SARS Coronavirus 2 by RT PCR 02/11/2022 POSITIVE (A)  NEGATIVE Final   Comment: (NOTE) SARS-CoV-2 target nucleic acids are DETECTED  SARS-CoV-2 RNA is generally detectable in upper respiratory specimens  during the acute phase of infection.  Positive results are indicative  of the presence of the identified virus, but do not rule out bacterial infection or co-infection with other pathogens not detected by the test.  Clinical correlation with patient history and  other diagnostic information is necessary to determine patient infection status.  The expected result is negative.  Fact Sheet for Patients:   https://www.patel.info/   Fact Sheet for Healthcare Providers:   https://hall.com/    This test is not yet approved or cleared by the [place] FDA and  has been authorized for detection and/or diagnosis of SARS-CoV-2 by FDA under an Emergency Use Authorization (EUA).  This EUA will remain in effect (meaning this test can be used) for the duration of  the COVID-19 declaration under Section 564(b)(1)                           of the Act, 21 U.S.C. section 360-bbb-3(b)(1), unless the authorization is terminated or revoked sooner.   Performed at [facility], [place] [address], [place], [place] [address]    Group A Strep by PCR 02/11/2022 NOT DETECTED  NOT DETECTED Final   Performed at [place] [address] [place], [place] [address]  Admission on  01/22/2022, Discharged on 01/22/2022  Component Date Value Ref Range Status   Glucose-Capillary 01/22/2022 122 (H)  70 - 99 mg/dL Final   Glucose reference range applies only to samples taken after fasting for at least 8 hours.   Sodium 01/22/2022 139  135 - 145 mmol/L Final   Potassium 01/22/2022 3.5  3.5 - 5.1 mmol/L Final   Chloride 01/22/2022 109  98 - 111 mmol/L Final   CO2 01/22/2022 23  22 - 32 mmol/L Final   Glucose, Bld 01/22/2022 98  70 - 99 mg/dL Final   Glucose reference range applies only to samples taken after fasting for at least 8 hours.   BUN 01/22/2022 14  6 - 20 mg/dL Final   Creatinine, Ser 01/22/2022 0.88  0.44 - 1.00 mg/dL Final   Calcium 01/22/2022 9.4  8.9 - 10.3 mg/dL Final   Total Protein 01/22/2022 7.5  6.5 - 8.1 g/dL Final   Albumin 01/22/2022 4.1  3.5 - 5.0 g/dL Final   AST 01/22/2022 42 (H)  15 - 41 U/L Final   ALT 01/22/2022 24  0 - 44 U/L Final   Alkaline Phosphatase 01/22/2022 53  38 - 126 U/L Final   Total Bilirubin 01/22/2022 0.4  0.3 - 1.2 mg/dL Final   GFR, Estimated 01/22/2022 >60  >60 mL/min Final   Comment: (NOTE) Calculated using the  CKD-EPI Creatinine Equation (2021)    Anion gap 01/22/2022 7  5 - 15 Final   Performed at [facility], [place] [place] [address] [place], [place] [address]   Salicylate Lvl 18/29/9371 <7.0 (L)  7.0 - 30.0 mg/dL Final   Performed at [facility] [address], [place], [place] [address]   Acetaminophen (Tylenol), Serum 01/22/2022 <10 (L)  10 - 30 ug/mL Final   Comment: (NOTE) Therapeutic concentrations vary significantly. A range of 10-30 ug/mL  may be an effective concentration for many patients. However, some  are best treated at concentrations outside of this range. Acetaminophen concentrations >150 ug/mL at 4 hours after ingestion  and >50 ug/mL at 12 hours after ingestion are often associated with  toxic reactions.  Performed at [facility], [place] [address] [place], [place] [address]    Alcohol, Ethyl (B) 01/22/2022 <10  <10 mg/dL Final   Comment: (NOTE) Lowest detectable limit for serum alcohol is 10 mg/dL.  For medical purposes only. Performed at [facility], [place] [address] [place], [place] [address]    Opiates 01/22/2022 NONE DETECTED  NONE DETECTED Final   Cocaine 01/22/2022 NONE DETECTED  NONE DETECTED Final   Benzodiazepines 01/22/2022 NONE DETECTED  NONE DETECTED Final   Amphetamines 01/22/2022 NONE DETECTED  NONE DETECTED Final   Tetrahydrocannabinol 01/22/2022 NONE DETECTED  NONE DETECTED Final   Barbiturates 01/22/2022 NONE DETECTED  NONE DETECTED Final   Comment: (NOTE) DRUG SCREEN FOR MEDICAL PURPOSES ONLY.  IF CONFIRMATION IS NEEDED FOR ANY PURPOSE, NOTIFY LAB WITHIN 5 DAYS.  LOWEST DETECTABLE LIMITS FOR URINE DRUG SCREEN Drug Class                     Cutoff (ng/mL) Amphetamine and metabolites    1000 Barbiturate and metabolites    200 Benzodiazepine                 258 Tricyclics and metabolites     300 Opiates and metabolites        300 Cocaine and metabolites        300 THC                            50 Performed at [facility], [place] [address], [place], [place] [address]    WBC 01/22/2022 11.6 (H)  4.0 - 10.5 K/uL Final   RBC 01/22/2022 4.51  3.87 - 5.11 MIL/uL Final   Hemoglobin 01/22/2022 11.3 (L)  12.0 - 15.0 g/dL Final   HCT 01/22/2022 36.3  36.0 - 46.0 % Final   MCV 01/22/2022 80.5  80.0 - 100.0 fL Final   MCH 01/22/2022 25.1 (L)  26.0 - 34.0 pg Final   MCHC 01/22/2022 31.1  30.0 - 36.0 g/dL Final   RDW 01/22/2022 16.3 (H)  11.5 - 15.5 % Final   Platelets 01/22/2022 263  150 - 400 K/uL Final   nRBC 01/22/2022 0.0  0.0 - 0.2 % Final   Neutrophils Relative % 01/22/2022 56  % Final   Neutro Abs 01/22/2022 6.5  1.7 - 7.7 K/uL Final   Lymphocytes Relative 01/22/2022 33  % Final   Lymphs Abs 01/22/2022 3.9  0.7 - 4.0 K/uL Final   Monocytes Relative 01/22/2022 7  %  Final   Monocytes Absolute 01/22/2022 0.8  0.1 - 1.0 K/uL Final   Eosinophils Relative 01/22/2022 3  % Final  Eosinophils Absolute 01/22/2022 0.4  0.0 - 0.5 K/uL Final   Basophils Relative 01/22/2022 1  % Final   Basophils Absolute 01/22/2022 0.1  0.0 - 0.1 K/uL Final   Immature Granulocytes 01/22/2022 0  % Final   Abs Immature Granulocytes 01/22/2022 0.05  0.00 - 0.07 K/uL Final   Performed at [facility], [place] [address], [place], [place] [address]   Lithium Lvl 01/22/2022 0.20 (L)  0.60 - 1.20 mmol/L Final   Performed at [facility] [address] [place], [place] [address]   Preg Test, Ur 01/22/2022 NEGATIVE  NEGATIVE Final   Comment:        THE SENSITIVITY OF THIS METHODOLOGY IS >20 mIU/mL. Performed at [facility], [place] [address], [place], [place] [address]    SARS Coronavirus 2 by RT PCR 01/22/2022 NEGATIVE  NEGATIVE Final   Comment: (NOTE) SARS-CoV-2 target nucleic acids are NOT DETECTED.  The SARS-CoV-2 RNA is generally detectable in upper respiratory specimens during the acute phase of infection. The lowest concentration of SARS-CoV-2 viral copies this assay can detect is 138 copies/mL. A negative result does not preclude SARS-Cov-2 infection and should not be used as the sole basis for treatment or other patient management decisions. A negative result may occur with  improper specimen collection/handling, submission of specimen other than nasopharyngeal swab, presence of viral mutation(s) within the areas targeted by this assay, and inadequate number of viral copies(<138 copies/mL). A negative result must be combined with clinical observations, patient history, and epidemiological information. The expected result is Negative.  Fact Sheet for Patients:  EntrepreneurPulse.com.au  Fact Sheet for Healthcare Providers:  IncredibleEmployment.be  This test is no                           t yet approved or cleared by the [place] FDA and  has been authorized for detection and/or diagnosis of SARS-CoV-2 by FDA under an Emergency Use Authorization (EUA). This EUA will remain  in effect (meaning this test can be used) for the duration of the COVID-19 declaration under Section 564(b)(1) of the Act, 21 U.S.C.section 360bbb-3(b)(1), unless the authorization is terminated  or revoked sooner.       Influenza A by PCR 01/22/2022 NEGATIVE  NEGATIVE Final   Influenza B by PCR 01/22/2022 NEGATIVE  NEGATIVE Final   Comment: (NOTE) The Xpert Xpress SARS-CoV-2/FLU/RSV plus assay is intended as an aid in the diagnosis of influenza from Nasopharyngeal swab specimens and should not be used as a sole basis for treatment. Nasal washings and aspirates are unacceptable for Xpert Xpress SARS-CoV-2/FLU/RSV testing.  Fact Sheet for Patients: EntrepreneurPulse.com.au  Fact Sheet for Healthcare Providers: IncredibleEmployment.be  This test is not yet approved or cleared by the [place] FDA and has been authorized for detection and/or diagnosis of SARS-CoV-2 by FDA under an Emergency Use Authorization (EUA). This EUA will remain in effect (meaning this test can be used) for the duration of the COVID-19 declaration under Section 564(b)(1) of the Act, 21 U.S.C. section 360bbb-3(b)(1), unless the authorization is terminated or revoked.  Performed at [facility], [place] [address] [place],  [address]   Admission on 01/10/2022, Discharged on 01/11/2022  Component Date Value Ref Range Status   SARS Coronavirus 2 by RT PCR 01/10/2022 NEGATIVE  NEGATIVE Final   Comment: (NOTE) SARS-CoV-2 target nucleic acids are NOT DETECTED.  The SARS-CoV-2 RNA is generally detectable in upper respiratory  specimens during the acute phase of infection. The lowest concentration of SARS-CoV-2 viral copies this assay can detect  is 138 copies/mL. A negative result does not preclude SARS-Cov-2 infection and should not be used as the sole basis for treatment or other patient management decisions. A negative result may occur with  improper specimen collection/handling, submission of specimen other than nasopharyngeal swab, presence of viral mutation(s) within the areas targeted by this assay, and inadequate number of viral copies(<138 copies/mL). A negative result must be combined with clinical observations, patient history, and epidemiological information. The expected result is Negative.  Fact Sheet for Patients:  EntrepreneurPulse.com.au  Fact Sheet for Healthcare Providers:  IncredibleEmployment.be  This test is no                          t yet approved or cleared by the [place] FDA and  has been authorized for detection and/or diagnosis of SARS-CoV-2 by FDA under an Emergency Use Authorization (EUA). This EUA will remain  in effect (meaning this test can be used) for the duration of the COVID-19 declaration under Section 564(b)(1) of the Act, 21 U.S.C.section 360bbb-3(b)(1), unless the authorization is terminated  or revoked sooner.       Influenza A by PCR 01/10/2022 NEGATIVE  NEGATIVE Final   Influenza B by PCR 01/10/2022 NEGATIVE  NEGATIVE Final   Comment: (NOTE) The Xpert Xpress SARS-CoV-2/FLU/RSV plus assay is intended as an aid in the diagnosis of influenza from Nasopharyngeal swab specimens and should not be used as a sole basis for treatment. Nasal washings and aspirates are unacceptable for Xpert Xpress SARS-CoV-2/FLU/RSV testing.  Fact Sheet for Patients: EntrepreneurPulse.com.au  Fact Sheet for Healthcare Providers: IncredibleEmployment.be  This test is not yet approved or cleared by the [place] FDA and has been authorized for detection and/or diagnosis of SARS-CoV-2 by FDA under an Emergency Use  Authorization (EUA). This EUA will remain in effect (meaning this test can be used) for the duration of the COVID-19 declaration under Section 564(b)(1) of the Act, 21 U.S.C. section 360bbb-3(b)(1), unless the authorization is terminated or revoked.  Performed at [facility], [place] [address], [place], [place] [address]    WBC 01/10/2022 11.0 (H)  4.0 - 10.5 K/uL Final   RBC 01/10/2022 4.43  3.87 - 5.11 MIL/uL Final   Hemoglobin 01/10/2022 10.8 (L)  12.0 - 15.0 g/dL Final   HCT 01/10/2022 35.0 (L)  36.0 - 46.0 % Final   MCV 01/10/2022 79.0 (L)  80.0 - 100.0 fL Final   MCH 01/10/2022 24.4 (L)  26.0 - 34.0 pg Final   MCHC 01/10/2022 30.9  30.0 - 36.0 g/dL Final   RDW 01/10/2022 16.4 (H)  11.5 - 15.5 % Final   Platelets 01/10/2022 280  150 - 400 K/uL Final   REPEATED TO VERIFY   nRBC 01/10/2022 0.0  0.0 - 0.2 % Final   Neutrophils Relative % 01/10/2022 54  % Final   Neutro Abs 01/10/2022 6.0  1.7 - 7.7 K/uL Final   Lymphocytes Relative 01/10/2022 35  % Final   Lymphs Abs 01/10/2022 3.8  0.7 - 4.0 K/uL Final   Monocytes Relative 01/10/2022 6  % Final   Monocytes Absolute 01/10/2022 0.6  0.1 - 1.0 K/uL Final   Eosinophils Relative 01/10/2022 4  % Final   Eosinophils Absolute 01/10/2022 0.4  0.0 - 0.5 K/uL Final   Basophils Relative 01/10/2022 1  % Final   Basophils Absolute  01/10/2022 0.1  0.0 - 0.1 K/uL Final   Immature Granulocytes 01/10/2022 0  % Final   Abs Immature Granulocytes 01/10/2022 0.04  0.00 - 0.07 K/uL Final   Performed at [facility], [place] [address] [place], [place] [address]   Sodium 01/10/2022 140  135 - 145 mmol/L Final   Potassium 01/10/2022 3.6  3.5 - 5.1 mmol/L Final   Chloride 01/10/2022 104  98 - 111 mmol/L Final   CO2 01/10/2022 25  22 - 32 mmol/L Final   Glucose, Bld 01/10/2022 267 (H)  70 - 99 mg/dL Final   Glucose reference range applies only to samples taken after fasting for at least 8 hours.   BUN 01/10/2022 8  6 - 20 mg/dL Final    Creatinine, Ser 01/10/2022 0.79  0.44 - 1.00 mg/dL Final   Calcium 01/10/2022 9.8  8.9 - 10.3 mg/dL Final   Total Protein 01/10/2022 7.0  6.5 - 8.1 g/dL Final   Albumin 01/10/2022 3.9  3.5 - 5.0 g/dL Final   AST 01/10/2022 26  15 - 41 U/L Final   ALT 01/10/2022 21  0 - 44 U/L Final   Alkaline Phosphatase 01/10/2022 52  38 - 126 U/L Final   Total Bilirubin 01/10/2022 0.6  0.3 - 1.2 mg/dL Final   GFR, Estimated 01/10/2022 >60  >60 mL/min Final   Comment: (NOTE) Calculated using the CKD-EPI Creatinine Equation (2021)    Anion gap 01/10/2022 11  5 - 15 Final   Performed at [facility] [address] [place], [place] [address]   Hgb A1c MFr Bld 01/10/2022 7.6 (H)  4.8 - 5.6 % Final   Comment: (NOTE) Pre diabetes:          5.7%-6.4%  Diabetes:              >6.4%  Glycemic control for   <7.0% adults with diabetes    Mean Plasma Glucose 01/10/2022 171.42  mg/dL Final   Performed at [facility] [address] [place], [place] [address]   Magnesium 01/10/2022 2.0  1.7 - 2.4 mg/dL Final   Performed at [facility] [address] [place], [place] [address]   Alcohol, Ethyl (B) 01/10/2022 <10  <10 mg/dL Final   Comment: (NOTE) Lowest detectable limit for serum alcohol is 10 mg/dL.  For medical purposes only. Performed at [facility], [place] [address] [place], [place] [address]    TSH 01/10/2022 0.983  0.350 - 4.500 uIU/mL Final   Comment: Performed by a 3rd Generation assay with a functional sensitivity of <=0.01 uIU/mL. Performed at [facility], [place] [address] [place], [place] [address]    Lithium Lvl 01/10/2022 0.18 (L)  0.60 - 1.20 mmol/L Final   Performed at [facility] [address], [place], [place] [address]   SARS Coronavirus 2 Ag 01/10/2022 Negative  Negative Preliminary   SARSCOV2ONAVIRUS 2 AG 01/10/2022 NEGATIVE  NEGATIVE Final   Comment: (NOTE) SARS-CoV-2 antigen NOT DETECTED.   Negative results are presumptive.  Negative  results do not preclude SARS-CoV-2 infection and should not be used as the sole basis for treatment or other patient management decisions, including infection  control decisions, particularly in the presence of clinical signs and  symptoms consistent with COVID-19, or in those who have been in contact with the virus.  Negative results must be combined with clinical observations, patient history, and epidemiological information. The expected result is Negative.  Fact Sheet for Patients: HandmadeRecipes.com.cy  Fact Sheet for Healthcare Providers: FuneralLife.at  This test is not yet approved or cleared by the [place] FDA and  has been authorized for detection and/or diagnosis of SARS-CoV-2 by FDA under an Emergency Use Authorization (EUA).  This EUA will remain in effect (meaning this test can be used) for the duration of  the COV                          ID-19 declaration under Section 564(b)(1) of the Act, 21 U.S.C. section 360bbb-3(b)(1), unless the authorization is terminated or revoked sooner.     Glucose-Capillary 01/10/2022 279 (H)  70 - 99 mg/dL Final   Glucose reference range applies only to samples taken after fasting for at least 8 hours.  There may be more visits with results that are not included.    Allergies: Wellbutrin [bupropion], Omnipaque [iohexol], Penicillins, Atarax [hydroxyzine], Contrast media [iodinated contrast media], and Cymbalta [duloxetine hcl]  PTA Medications:   Prior to Admission medications   Medication Sig Start Date End Date Taking? Authorizing Provider  benztropine (COGENTIN) 0.5 MG tablet Take 0.5 mg by mouth 2 (two) times daily.    [provider]  Cholecalciferol (VITAMIN D-3 PO) Take 1 tablet by mouth daily.    [provider]  fluconazole (DIFLUCAN) 150 MG tablet Take 1 tablet (150 mg total) by mouth daily. One qd x 3 days, then repeat again the same way next week  03/26/22   Rodriguez-Southworth, Sunday Spillers, PA-C  gabapentin (NEURONTIN) 100 MG capsule Take 100 mg by mouth 3 (three) times daily.    [provider]  haloperidol (HALDOL) 10 MG tablet Take 10 mg by mouth at bedtime. Take with 53m tablet for 175mdose.    [provider]  haloperidol (HALDOL) 5 MG tablet Take 5 mg by mouth at bedtime. Take with 1024mablet for 76m48mse.    [provider]  haloperidol decanoate (HALDOL DECANOATE) 100 MG/ML injection Inject 150 mg into the muscle every 30 (thirty) days.    [provider]  insulin detemir (LEVEMIR) 100 UNIT/ML injection Inject 0.4 mLs (40 Units total) into the skin 2 (two) times daily. 10/14/21   OnuoDelfin Gant  lithium carbonate (ESKALITH) 450 MG CR tablet Take 450 mg by mouth at bedtime.    [provider]  metoprolol tartrate (LOPRESSOR) 25 MG tablet Take 12.5 mg by mouth 2 (two) times daily.    [provider]  miconazole (MONISTAT 1 COMBO PACK) kit Place 1 each vaginally once for 1 dose. One applicator per week for prevention of yeast infection 03/08/23 03/08/23  Rodriguez-Southworth, SylvSunday Spillers-C  nystatin cream (MYCOSTATIN) Apply 1 Application topically 2 (two) times daily. Applying vaginally 01/07/22   [provider]           Medical Decision Making  Recommend admission to Inpatient Psychiatry for stabilization and treatment. Patient currently admitted to the continuous observation unit for safety monitoring pending inpatient psychiatric hospitalization.     [facility] and not remarkable.    Lab Orders         Resp panel by RT-PCR (RSV, Flu A&B, Covid) Anterior Nasal Swab         Pregnancy, urine         POCT Urine Drug Screen       Lipid panel      EKG  Home medication restarted  this encounter - Cogentin 0.42m PO BID for EPS prophylaxis - Haldol 162m POdaily at bedtime for mood stabilization -Metropolol 12.54m39mO BID  Hypertension - SSI AC&HS- Type II diabetes   PRN:  Tylenol PO 650m3mhr PRN pain MAALOX 30ml76mq4hr PRN for indigestion MOM 30ml 26maily PRN for constipation Trazodone 50mg P56mN sleep     Recommendations  Based on my evaluation the patient does not appear to have an emergency medical condition.  Recommend admission to Inpatient Psychiatry for stabilization and treatment. Patient currently admitted to the continuous observation unit for safety monitoring pending inpatient psychiatric hospitalization.   Berish Bohman Earney Mallet/14/23  10:48 PM
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
## 2022-04-09 NOTE — ED Triage Notes (Signed)
Pt presents to [facility] voluntarily, accompanied by [employer] with suicidal ideations with a plan to overdose on medications. Pt reports calling the police to her home tonight due to depression and dealing with "demons and witchcraft". Pt reports she has taken prescribed medication Haldol and Buspar earlier today. Pt believes there are demons in her shoes. [facility] continues to be ACTT provider. Pt denies HI, AVH and substance/alcohol use.

## 2022-04-10 ENCOUNTER — Inpatient Hospital Stay (HOSPITAL_COMMUNITY)
Admission: AD | Admit: 2022-04-10 | Discharge: 2022-04-14 | DRG: 885 | Disposition: A | Discharge status: Home / Self Care | Payer: Medicaid Other | Source: Intra-hospital | Attending: Psychiatry | Admitting: Psychiatry

## 2022-04-10 ENCOUNTER — Other Ambulatory Visit: Payer: Self-pay

## 2022-04-10 ENCOUNTER — Encounter (HOSPITAL_COMMUNITY): Payer: Self-pay | Admitting: Psychiatry

## 2022-04-10 DIAGNOSIS — Z79899 Other long term (current) drug therapy: Secondary | ICD-10-CM

## 2022-04-10 DIAGNOSIS — F250 Schizoaffective disorder, bipolar type: Secondary | ICD-10-CM | POA: Diagnosis present

## 2022-04-10 DIAGNOSIS — R45851 Suicidal ideations: Secondary | ICD-10-CM | POA: Diagnosis present

## 2022-04-10 DIAGNOSIS — I10 Essential (primary) hypertension: Secondary | ICD-10-CM | POA: Diagnosis present

## 2022-04-10 DIAGNOSIS — E119 Type 2 diabetes mellitus without complications: Secondary | ICD-10-CM | POA: Diagnosis present

## 2022-04-10 DIAGNOSIS — K219 Gastro-esophageal reflux disease without esophagitis: Secondary | ICD-10-CM | POA: Diagnosis present

## 2022-04-10 DIAGNOSIS — F17290 Nicotine dependence, other tobacco product, uncomplicated: Secondary | ICD-10-CM | POA: Diagnosis present

## 2022-04-10 DIAGNOSIS — Z880 Allergy status to penicillin: Secondary | ICD-10-CM | POA: Diagnosis not present

## 2022-04-10 DIAGNOSIS — E7800 Pure hypercholesterolemia, unspecified: Secondary | ICD-10-CM | POA: Diagnosis present

## 2022-04-10 DIAGNOSIS — K30 Functional dyspepsia: Secondary | ICD-10-CM | POA: Diagnosis present

## 2022-04-10 DIAGNOSIS — F209 Schizophrenia, unspecified: Principal | ICD-10-CM | POA: Diagnosis present

## 2022-04-10 DIAGNOSIS — K5900 Constipation, unspecified: Secondary | ICD-10-CM | POA: Diagnosis present

## 2022-04-10 DIAGNOSIS — Z7984 Long term (current) use of oral hypoglycemic drugs: Secondary | ICD-10-CM

## 2022-04-10 DIAGNOSIS — F332 Major depressive disorder, recurrent severe without psychotic features: Secondary | ICD-10-CM | POA: Diagnosis present

## 2022-04-10 LAB — GLUCOSE, CAPILLARY
Glucose-Capillary: 201 mg/dL — ABNORMAL HIGH (ref 70–99)
Glucose-Capillary: 208 mg/dL — ABNORMAL HIGH (ref 70–99)
Glucose-Capillary: 207 mg/dL — ABNORMAL HIGH (ref 70–99)
Glucose-Capillary: 205 mg/dL — ABNORMAL HIGH (ref 70–99)
Glucose-Capillary: 200 mg/dL — ABNORMAL HIGH (ref 70–99)

## 2022-04-10 LAB — RESP PANEL BY RT-PCR (RSV, FLU A&B, COVID)  RVPGX2
Influenza A by PCR: NEGATIVE
Influenza B by PCR: NEGATIVE
Resp Syncytial Virus by PCR: NEGATIVE
SARS Coronavirus 2 by RT PCR: NEGATIVE

## 2022-04-10 MED ORDER — MAGNESIUM HYDROXIDE 400 MG/5ML PO SUSP: 30.0000 mL | Freq: Every day | ORAL | Status: DC | PRN

## 2022-04-10 MED ORDER — INSULIN ASPART 100 UNIT/ML IJ SOLN
0.0000 [IU] | Freq: Three times a day (TID) | INTRAMUSCULAR | Status: DC
  Administered 2022-04-10 – 2022-04-11 (×2): 3 [IU] via SUBCUTANEOUS
  Administered 2022-04-11: 5 [IU] via SUBCUTANEOUS
  Administered 2022-04-11: 3 [IU] via SUBCUTANEOUS
  Administered 2022-04-12: 7 [IU] via SUBCUTANEOUS
  Administered 2022-04-12: 3 [IU] via SUBCUTANEOUS
  Administered 2022-04-12 – 2022-04-13 (×2): 2 [IU] via SUBCUTANEOUS
  Administered 2022-04-13 – 2022-04-14 (×2): 5 [IU] via SUBCUTANEOUS
  Administered 2022-04-14: 3 [IU] via SUBCUTANEOUS

## 2022-04-10 MED ORDER — ACETAMINOPHEN 325 MG PO TABS: 650.0000 mg | ORAL_TABLET | Freq: Four times a day (QID) | ORAL | Status: DC | PRN

## 2022-04-10 MED ORDER — INSULIN ASPART 100 UNIT/ML IJ SOLN
0.0000 [IU] | Freq: Every day | INTRAMUSCULAR | Status: DC
  Administered 2022-04-12 – 2022-04-13 (×2): 2 [IU] via SUBCUTANEOUS

## 2022-04-10 MED ORDER — METOPROLOL TARTRATE 12.5 MG HALF TABLET
12.5000 mg | ORAL_TABLET | Freq: Two times a day (BID) | ORAL | Status: DC
  Administered 2022-04-10 – 2022-04-14 (×8): 12.5 mg via ORAL
  Filled 2022-04-10 (×12): qty 1

## 2022-04-10 MED ORDER — ALUM & MAG HYDROXIDE-SIMETH 200-200-20 MG/5ML PO SUSP: 30.0000 mL | ORAL | Status: DC | PRN

## 2022-04-10 MED ORDER — HALOPERIDOL 5 MG PO TABS
10.0000 mg | ORAL_TABLET | Freq: Every day | ORAL | Status: DC
  Administered 2022-04-10 – 2022-04-13 (×4): 10 mg via ORAL
  Filled 2022-04-10 (×6): qty 2

## 2022-04-10 MED ORDER — BENZTROPINE MESYLATE 0.5 MG PO TABS
0.5000 mg | ORAL_TABLET | Freq: Two times a day (BID) | ORAL | Status: DC
  Administered 2022-04-10 – 2022-04-14 (×8): 0.5 mg via ORAL
  Filled 2022-04-10 (×12): qty 1

## 2022-04-10 MED ORDER — TRAZODONE HCL 50 MG PO TABS
50.0000 mg | ORAL_TABLET | Freq: Every evening | ORAL | Status: DC | PRN
  Administered 2022-04-10 – 2022-04-13 (×3): 50 mg via ORAL
  Filled 2022-04-10 (×3): qty 1

## 2022-04-10 NOTE — ED Notes (Signed)
Patient is transferring at this time to [facility] via safe transport. Patient is A&Ox4. Vs stable. Patient reports passive SI with no intent or plan but denies HI. Patient experiences hallucinations at times of witchcraft and demons but denies A/V/H at this moment. Printed AVS reviewed with patient and placed in brown folder with voluntary consent, EKG, EMTALA, and other transfer paperwork. Patient verbalized all understanding. All valuables/belongings returned to patient. No s/s of current distress.

## 2022-04-10 NOTE — Tx Team (Signed)
Initial Treatment Plan 04/10/2022 3:46 PM Alesana [name] VTX:[id number]    PATIENT STRESSORS: Medication change or noncompliance   Occupational concerns     PATIENT STRENGTHS: Capable of independent living  Communication skills  Religious Affiliation  Supportive family/friends    PATIENT IDENTIFIED PROBLEMS: Altered perception "Yeah, I've been hearing voices and stuff. My ACTT team changed my medicines"    Alterations in mood "I'm depressed, I feel sad and it's getting worse"    Danger to self "I feel sad, I feel like hurting myself. I've thought abut overdosing on my medicines".             DISCHARGE CRITERIA:  Improved stabilization in mood, thinking, and/or behavior Verbal commitment to aftercare and medication compliance  PRELIMINARY DISCHARGE PLAN: Outpatient therapy Return to previous living arrangement  PATIENT/FAMILY INVOLVEMENT: This treatment plan has been presented to and reviewed with the patient, Kitti Mcclish.  The patient have been given the opportunity to ask questions and make suggestions.  Keane Police, RN 04/10/2022, 3:46 PM

## 2022-04-10 NOTE — ED Provider Notes (Incomplete)
[facility] Continuous Assessment Admission H&P  Date: 04/09/22 Patient Name: [name] MRN: [id number] Chief Complaint: "I am depressed and suicidal with a plan to overdose on my medication" Chief Complaint  Patient presents with  . Suicidal      Diagnoses:  Final diagnoses:  Suicidal ideation  Depression, unspecified depression type    HPI: [name] is a 31 year old African-American female with history of Suicidal Ideation, Major depressive disorder, schizoaffective disorder bipolar type who presented voluntarily accompanied by GPD with complaints of depression and suicidal ideation with a plan to overdose on her medication.   Patient was seen face to face by this provider and chart reviewed.  Per chart review, patient presented to [facility] ED on 04/06/22 with the complaint that her medication was messed up. Patient was discharged on 04/07/22 to see her ACT team.   On evaluation patient is alert and oriented x4, speech is clear and coherent. Patient's eye contact is good, mood is depressed, affect is blunt. Patient's thought process is coherent and thought content is within normal limits. Patient denies Suicidal thoughts with a plan to overdose. [name] denies HI, denies AVH, or paranoia. Patient says that she is not seeing demons but can feel them. There is no indication that the patient is responding to internal stimuli and no delusion noted.   Patient has an ACT team called [facility].   Patient reported that today she was having suicidal thoughts with a plan to overdose on her medication and she called the police to bring her here. She reported that she was not seeing any demons but can feel them. Patient states that she is depressed and sleeping too much and her appetite is good.  Patient says that she took all her medication earlier today, but was not able to say what medication she is taking.   Support, encouragement and reassurance provided about  ongoing stressors and patient provided with opportunity for questions.          PHQ 2-9:  [name] ED from 04/09/2022 in [facility] ED from 01/10/2022 in [facility] ED from 10/24/2021 in [facility]  Thoughts that you would be better off dead, or of hurting yourself in some way Several days More than half the days Several days  PHQ-9 Total Score _0 Flowsheet Row ED from 04/09/2022 in [facility] ED from 04/06/2022 in [facility] ED from 03/26/2022 in [facility] No Risk Error: Question 6 not populated        Total Time spent with patient: 20 minutes  Musculoskeletal  Strength & Muscle Tone: within normal limits Gait & Station: normal Patient leans: N/A  Psychiatric Specialty Exam  Presentation General Appearance:  Appropriate for Environment  Eye Contact: Good  Speech: Normal Rate  Speech Volume: Normal  Handedness: Right   Mood and Affect  Mood: Depressed  Affect: Appropriate   Thought Process  Thought Processes: Coherent  Descriptions of Associations:Intact  Orientation:Full (Time, Place and Person)  Thought Content:WDL  Diagnosis of Schizophrenia or Schizoaffective disorder in past: Yes   Hallucinations:Hallucinations: None  Ideas of Reference:None  Suicidal Thoughts:Suicidal Thoughts: Yes, Active SI Active Intent and/or Plan: With Plan  Homicidal Thoughts:Homicidal Thoughts: No   Sensorium  Memory: Immediate Good; Recent Good; Remote Good  Judgment: Fair  Insight: Fair  Executive Functions  Concentration: Good  Attention Span: Good  Recall: Roel Cluck of Knowledge: Fair  Language: Good   Psychomotor Activity  Psychomotor Activity: Psychomotor Activity: Normal   Assets  Assets: Physical  Health; Resilience; Communication Skills   Sleep  Sleep: Sleep: Good   Nutritional Assessment (For OBS and FBC admissions only) Has the patient had a weight loss or gain of 10 pounds or more in the last 3 months?: No Has the patient had a decrease in food intake/or appetite?: No Does the patient have dental problems?: No Does the patient have eating habits or behaviors that may be indicators of an eating disorder including binging or inducing vomiting?: No Has the patient recently lost weight without trying?: 0 Has the patient been eating poorly because of a decreased appetite?: 0 Malnutrition Screening Tool Score: 0    Physical Exam Constitutional:      Appearance: Normal appearance.  HENT:     Nose: No congestion.  Eyes:     General:        Right eye: No discharge.        Left eye: No discharge.  Cardiovascular:     Rate and Rhythm: Normal rate.  Pulmonary:     Effort: No respiratory distress.     Breath sounds: No wheezing.  Musculoskeletal:        General: No signs of injury.  Skin:    Findings: No bruising.  Neurological:     Mental Status: She is alert and oriented to person, place, and time.     Motor: No weakness.  Psychiatric:        Mood and Affect: Mood normal.        Speech: Speech normal.        Behavior: Behavior is cooperative.        Thought Content: Thought content is not paranoid or delusional. Thought content includes suicidal ideation. Thought content does not include homicidal ideation. Thought content includes suicidal plan. Thought content does not include homicidal plan.        Judgment: Judgment normal.    Review of Systems  Constitutional:  Negative for fever.  HENT:  Negative for ear discharge.   Eyes:  Negative for discharge.  Respiratory:  Negative for cough, shortness of breath and wheezing.   Gastrointestinal:  Negative for abdominal pain, diarrhea, nausea and vomiting.  Neurological:  Negative for dizziness, tremors, seizures,  weakness and headaches.  Psychiatric/Behavioral:  Positive for depression and suicidal ideas. Negative for hallucinations and substance abuse.     Last menstrual period 03/05/2022. There is no height or weight on file to calculate BMI.  Past Psychiatric History: See HPI   Is the patient at risk to self? Yes  Has the patient been a risk to self in the past 6 months? Yes .    Has the patient been a risk to self within the distant past? Yes   Is the patient a risk to others? No   Has the patient been a risk to others in the past 6 months? No   Has the patient been a risk to others within the distant past? No   Past Medical History:  Past Medical History:  Diagnosis Date  . Anxiety   . Bipolar 1 disorder ([place])   . Cognitive deficits   . Depression   . Diabetes mellitus without complication ([place])   . Hypertension   . Mental disorder   . Mental health disorder   . Obesity  Past Surgical History:  Procedure Laterality Date  . CESAREAN SECTION    . CESAREAN SECTION N/A 04/25/2013   Procedure: REPEAT CESAREAN SECTION;  Surgeon: [name], MD;  Location: [facility];  Service: Obstetrics;  Laterality: N/A;  . MASS EXCISION N/A 06/03/2012   Procedure: EXCISION MASS;  Surgeon: [name], MD;  Location: [facility];  Service: ENT;  Laterality: N/A;  Excision uvula mass  . TONSILLECTOMY N/A 06/03/2012   Procedure: TONSILLECTOMY;  Surgeon: [name], MD;  Location: [facility];  Service: ENT;  Laterality: N/A;  . TONSILLECTOMY      Family History:  Family History  Problem Relation Age of Onset  . Hypertension Mother   . Diabetes Father     Social History:  Social History   Socioeconomic History  . Marital status: Single    Spouse name: Not on file  . Number of children: Not on file  . Years of education: Not on file  . Highest education level: Not on file  Occupational History  . Not on file  Tobacco Use  . Smoking status: Every Day     Types: Cigars  . Smokeless tobacco: Never  . Tobacco comments:    Pt declined  Vaping Use  . Vaping Use: Never used  Substance and Sexual Activity  . Alcohol use: Not Currently  . Drug use: Not Currently    Types: "Crack" cocaine, Other-see comments    Comment: Patient reports hx of smoking Crack  . Sexual activity: Not Currently    Birth control/protection: None    Comment: occasionally  Other Topics Concern  . Not on file  Social History Narrative   ** Merged History Encounter **       Social Determinants of Health   Financial Resource Strain: Not on file  Food Insecurity: Not on file  Transportation Needs: Not on file  Physical Activity: Not on file  Stress: Not on file  Social Connections: Not on file  Intimate Partner Violence: Not on file    SDOH:  SDOH Screenings   Alcohol Screen: Low Risk  (05/02/2020)  Depression (PHQ2-9): High Risk (04/09/2022)  Tobacco Use: High Risk (04/06/2022)    Last Labs:  Admission on 04/09/2022  Component Date Value Ref Range Status  . POC Amphetamine UR 04/09/2022 None Detected  NONE DETECTED (Cut Off Level 1000 ng/mL) Final  . POC Secobarbital (BAR) 04/09/2022 None Detected  NONE DETECTED (Cut Off Level 300 ng/mL) Final  . POC Buprenorphine (BUP) 04/09/2022 None Detected  NONE DETECTED (Cut Off Level 10 ng/mL) Final  . POC Oxazepam (BZO) 04/09/2022 None Detected  NONE DETECTED (Cut Off Level 300 ng/mL) Final  . POC Cocaine UR 04/09/2022 None Detected  NONE DETECTED (Cut Off Level 300 ng/mL) Final  . POC Methamphetamine UR 04/09/2022 None Detected  NONE DETECTED (Cut Off Level 1000 ng/mL) Final  . POC Morphine 04/09/2022 None Detected  NONE DETECTED (Cut Off Level 300 ng/mL) Final  . POC Methadone UR 04/09/2022 None Detected  NONE DETECTED (Cut Off Level 300 ng/mL) Final  . POC Oxycodone UR 04/09/2022 None Detected  NONE DETECTED (Cut Off Level 100 ng/mL) Final  . POC Marijuana UR 04/09/2022 None Detected  NONE DETECTED (Cut Off  Level 50 ng/mL) Final  . Preg Test, Ur 04/09/2022 NEGATIVE  NEGATIVE Final   Comment:        THE SENSITIVITY OF THIS METHODOLOGY IS >24 mIU/mL   Admission on 04/06/2022, Discharged on 04/07/2022  Component Date  Value Ref Range Status  . Sodium 04/06/2022 138  135 - 145 mmol/L Final  . Potassium 04/06/2022 3.8  3.5 - 5.1 mmol/L Final  . Chloride 04/06/2022 105  98 - 111 mmol/L Final  . CO2 04/06/2022 25  22 - 32 mmol/L Final  . Glucose, Bld 04/06/2022 243 (H)  70 - 99 mg/dL Final   Glucose reference range applies only to samples taken after fasting for at least 8 hours.  . BUN 04/06/2022 9  6 - 20 mg/dL Final  . Creatinine, Ser 04/06/2022 0.84  0.44 - 1.00 mg/dL Final  . Calcium 04/06/2022 9.5  8.9 - 10.3 mg/dL Final  . Total Protein 04/06/2022 8.0  6.5 - 8.1 g/dL Final  . Albumin 04/06/2022 4.1  3.5 - 5.0 g/dL Final  . AST 04/06/2022 28  15 - 41 U/L Final  . ALT 04/06/2022 21  0 - 44 U/L Final  . Alkaline Phosphatase 04/06/2022 67  38 - 126 U/L Final  . Total Bilirubin 04/06/2022 0.6  0.3 - 1.2 mg/dL Final  . GFR, Estimated 04/06/2022 >60  >60 mL/min Final   Comment: (NOTE) Calculated using the CKD-EPI Creatinine Equation (2021)   . Anion gap 04/06/2022 8  5 - 15 Final   Performed at [facility], [place] [address], [place], [place] [address]  . Alcohol, Ethyl (B) 04/06/2022 <10  <10 mg/dL Final   Comment: (NOTE) Lowest detectable limit for serum alcohol is 10 mg/dL.  For medical purposes only. Performed at [facility], [place] [address], [place], [place] [address]   . Opiates 04/06/2022 NONE DETECTED  NONE DETECTED Final  . Cocaine 04/06/2022 NONE DETECTED  NONE DETECTED Final  . Benzodiazepines 04/06/2022 NONE DETECTED  NONE DETECTED Final  . Amphetamines 04/06/2022 NONE DETECTED  NONE DETECTED Final  . Tetrahydrocannabinol 04/06/2022 NONE DETECTED  NONE DETECTED Final  . Barbiturates 04/06/2022 NONE DETECTED  NONE DETECTED Final    Comment: (NOTE) DRUG SCREEN FOR MEDICAL PURPOSES ONLY.  IF CONFIRMATION IS NEEDED FOR ANY PURPOSE, NOTIFY LAB WITHIN 5 DAYS.  LOWEST DETECTABLE LIMITS FOR URINE DRUG SCREEN Drug Class                     Cutoff (ng/mL) Amphetamine and metabolites    1000 Barbiturate and metabolites    200 Benzodiazepine                 200 Opiates and metabolites        300 Cocaine and metabolites        300 THC                            50 Performed at [facility], [place] [address] [place], [place] [address]   . WBC 04/06/2022 10.7 (H)  4.0 - 10.5 K/uL Final  . RBC 04/06/2022 4.66  3.87 - 5.11 MIL/uL Final  . Hemoglobin 04/06/2022 11.2 (L)  12.0 - 15.0 g/dL Final  . HCT 04/06/2022 36.2  36.0 - 46.0 % Final  . MCV 04/06/2022 77.7 (L)  80.0 - 100.0 fL Final  . MCH 04/06/2022 24.0 (L)  26.0 - 34.0 pg Final  . MCHC 04/06/2022 30.9  30.0 - 36.0 g/dL Final  . RDW 04/06/2022 15.3  11.5 - 15.5 % Final  . Platelets 04/06/2022 259  150 - 400 K/uL Final  . nRBC 04/06/2022 0.0  0.0 - 0.2 % Final  .  Neutrophils Relative % 04/06/2022 56  % Final  . Neutro Abs 04/06/2022 6.0  1.7 - 7.7 K/uL Final  . Lymphocytes Relative 04/06/2022 35  % Final  . Lymphs Abs 04/06/2022 3.7  0.7 - 4.0 K/uL Final  . Monocytes Relative 04/06/2022 5  % Final  . Monocytes Absolute 04/06/2022 0.6  0.1 - 1.0 K/uL Final  . Eosinophils Relative 04/06/2022 3  % Final  . Eosinophils Absolute 04/06/2022 0.3  0.0 - 0.5 K/uL Final  . Basophils Relative 04/06/2022 1  % Final  . Basophils Absolute 04/06/2022 0.1  0.0 - 0.1 K/uL Final  . Immature Granulocytes 04/06/2022 0  % Final  . Abs Immature Granulocytes 04/06/2022 0.03  0.00 - 0.07 K/uL Final   Performed at [facility], [place] [address], [place], [place] [address]  . I-stat hCG, quantitative 04/06/2022 <5.0  <5 mIU/mL Final  . Comment 3 04/06/2022          Final   Comment:   GEST. AGE      CONC.  (mIU/mL)   <=1 WEEK        5 - 50     2 WEEKS        50 - 500     3 WEEKS       100 - 10,000     4 WEEKS     1,000 - 30,000        FEMALE AND NON-PREGNANT FEMALE:     LESS THAN 5 mIU/mL   Admission on 03/26/2022, Discharged on 03/26/2022  Component Date Value Ref Range Status  . Glucose, UA 03/26/2022 >=1000 (A)  NEGATIVE mg/dL Final  . Bilirubin Urine 03/26/2022 NEGATIVE  NEGATIVE Final  . Ketones, ur 03/26/2022 NEGATIVE  NEGATIVE mg/dL Final  . Specific Gravity, Urine 03/26/2022 >=1.030  1.005 - 1.030 Final  . Hgb urine dipstick 03/26/2022 SMALL (A)  NEGATIVE Final  . pH 03/26/2022 5.0  5.0 - 8.0 Final  . Protein, ur 03/26/2022 30 (A)  NEGATIVE mg/dL Final  . Urobilinogen, UA 03/26/2022 0.2  0.0 - 1.0 mg/dL Final  . Nitrite 03/26/2022 NEGATIVE  NEGATIVE Final  . Chalmers Guest 03/26/2022 NEGATIVE  NEGATIVE Final   Biochemical Testing Only. Please order routine urinalysis from main lab if confirmatory testing is needed.  . Neisseria Gonorrhea 03/26/2022 Negative   Final  . Chlamydia 03/26/2022 Negative   Final  . Trichomonas 03/26/2022 Negative   Final  . Bacterial Vaginitis (gardnerella) 03/26/2022 Negative   Final  . Candida Vaginitis 03/26/2022 Positive (A)   Final  . Candida Glabrata 03/26/2022 Negative   Final  . Comment 03/26/2022 Normal Reference Range Bacterial Vaginosis - Negative   Final  . Comment 03/26/2022 Normal Reference Range Candida Species - Negative   Final  . Comment 03/26/2022 Normal Reference Range Candida Galbrata - Negative   Final  . Comment 03/26/2022 Normal Reference Range Trichomonas - Negative   Final  . Comment 03/26/2022 Normal Reference Ranger Chlamydia - Negative   Final  . Comment 03/26/2022 Normal Reference Range Neisseria Gonorrhea - Negative   Final  Admission on 03/26/2022, Discharged on 03/26/2022  Component Date Value Ref Range Status  . Glucose-Capillary 03/26/2022 349 (H)  70 - 99 mg/dL Final   Glucose reference range applies only to samples taken after fasting for at least 8 hours.   Admission on 03/25/2022, Discharged on 03/25/2022  Component Date Value Ref Range Status  . I-stat hCG, quantitative 03/25/2022 <5.0  <5 mIU/mL Final  .  Comment 3 03/25/2022          Final   Comment:   GEST. AGE      CONC.  (mIU/mL)   <=1 WEEK        5 - 50     2 WEEKS       50 - 500     3 WEEKS       100 - 10,000     4 WEEKS     1,000 - 30,000        FEMALE AND NON-PREGNANT FEMALE:     LESS THAN 5 mIU/mL   . HIV Screen 4th Generation wRfx 03/25/2022 Non Reactive  Non Reactive Final   Performed at [facility], [place] [address], [place], [place] [address]  . Glucose-Capillary 03/25/2022 318 (H)  70 - 99 mg/dL Final   Glucose reference range applies only to samples taken after fasting for at least 8 hours.  . Sodium 03/25/2022 138  135 - 145 mmol/L Final  . Potassium 03/25/2022 3.5  3.5 - 5.1 mmol/L Final  . Chloride 03/25/2022 103  98 - 111 mmol/L Final  . CO2 03/25/2022 27  22 - 32 mmol/L Final  . Glucose, Bld 03/25/2022 323 (H)  70 - 99 mg/dL Final   Glucose reference range applies only to samples taken after fasting for at least 8 hours.  . BUN 03/25/2022 13  6 - 20 mg/dL Final  . Creatinine, Ser 03/25/2022 0.82  0.44 - 1.00 mg/dL Final  . Calcium 03/25/2022 8.9  8.9 - 10.3 mg/dL Final  . Total Protein 03/25/2022 7.7  6.5 - 8.1 g/dL Final  . Albumin 03/25/2022 4.1  3.5 - 5.0 g/dL Final  . AST 03/25/2022 22  15 - 41 U/L Final  . ALT 03/25/2022 22  0 - 44 U/L Final  . Alkaline Phosphatase 03/25/2022 67  38 - 126 U/L Final  . Total Bilirubin 03/25/2022 0.6  0.3 - 1.2 mg/dL Final  . GFR, Estimated 03/25/2022 >60  >60 mL/min Final   Comment: (NOTE) Calculated using the CKD-EPI Creatinine Equation (2021)   . Anion gap 03/25/2022 8  5 - 15 Final   Performed at [facility], [place] [address], [place], [place] [address]  . WBC 03/25/2022 10.7 (H)  4.0 - 10.5 K/uL Final  . RBC 03/25/2022 4.66  3.87 - 5.11 MIL/uL Final  . Hemoglobin 03/25/2022 11.1 (L)  12.0 -  15.0 g/dL Final  . HCT 03/25/2022 36.4  36.0 - 46.0 % Final  . MCV 03/25/2022 78.1 (L)  80.0 - 100.0 fL Final  . MCH 03/25/2022 23.8 (L)  26.0 - 34.0 pg Final  . MCHC 03/25/2022 30.5  30.0 - 36.0 g/dL Final  . RDW 03/25/2022 15.6 (H)  11.5 - 15.5 % Final  . Platelets 03/25/2022 279  150 - 400 K/uL Final  . nRBC 03/25/2022 0.0  0.0 - 0.2 % Final  . Neutrophils Relative % 03/25/2022 54  % Final  . Neutro Abs 03/25/2022 5.7  1.7 - 7.7 K/uL Final  . Lymphocytes Relative 03/25/2022 36  % Final  . Lymphs Abs 03/25/2022 3.9  0.7 - 4.0 K/uL Final  . Monocytes Relative 03/25/2022 6  % Final  . Monocytes Absolute 03/25/2022 0.7  0.1 - 1.0 K/uL Final  . Eosinophils Relative 03/25/2022 3  % Final  . Eosinophils Absolute 03/25/2022 0.3  0.0 - 0.5 K/uL Final  . Basophils Relative 03/25/2022 1  % Final  .  Basophils Absolute 03/25/2022 0.1  0.0 - 0.1 K/uL Final  . Immature Granulocytes 03/25/2022 0  % Final  . Abs Immature Granulocytes 03/25/2022 0.04  0.00 - 0.07 K/uL Final   Performed at [facility], [place] [address], [place], [place] [address]  Admission on 03/23/2022, Discharged on 03/23/2022  Component Date Value Ref Range Status  . Glucose-Capillary 03/23/2022 349 (H)  70 - 99 mg/dL Final   Glucose reference range applies only to samples taken after fasting for at least 8 hours.  . WBC 03/23/2022 11.3 (H)  4.0 - 10.5 K/uL Final  . RBC 03/23/2022 4.44  3.87 - 5.11 MIL/uL Final  . Hemoglobin 03/23/2022 10.8 (L)  12.0 - 15.0 g/dL Final  . HCT 03/23/2022 34.6 (L)  36.0 - 46.0 % Final  . MCV 03/23/2022 77.9 (L)  80.0 - 100.0 fL Final  . MCH 03/23/2022 24.3 (L)  26.0 - 34.0 pg Final  . MCHC 03/23/2022 31.2  30.0 - 36.0 g/dL Final  . RDW 03/23/2022 15.4  11.5 - 15.5 % Final  . Platelets 03/23/2022 277  150 - 400 K/uL Final  . nRBC 03/23/2022 0.0  0.0 - 0.2 % Final   Performed at [facility], [place] [address], [place], [place] [address]  . Sodium 03/23/2022 136   135 - 145 mmol/L Final  . Potassium 03/23/2022 3.9  3.5 - 5.1 mmol/L Final  . Chloride 03/23/2022 104  98 - 111 mmol/L Final  . CO2 03/23/2022 22  22 - 32 mmol/L Final  . Glucose, Bld 03/23/2022 337 (H)  70 - 99 mg/dL Final   Glucose reference range applies only to samples taken after fasting for at least 8 hours.  . BUN 03/23/2022 12  6 - 20 mg/dL Final  . Creatinine, Ser 03/23/2022 0.70  0.44 - 1.00 mg/dL Final  . Calcium 03/23/2022 9.1  8.9 - 10.3 mg/dL Final  . GFR, Estimated 03/23/2022 >60  >60 mL/min Final   Comment: (NOTE) Calculated using the CKD-EPI Creatinine Equation (2021)   . Anion gap 03/23/2022 10  5 - 15 Final   Performed at [facility], [place] [address] [place], [place] [address]  . Color, Urine 03/23/2022 STRAW (A)  YELLOW Final  . APPearance 03/23/2022 CLEAR  CLEAR Final  . Specific Gravity, Urine 03/23/2022 1.022  1.005 - 1.030 Final  . pH 03/23/2022 6.0  5.0 - 8.0 Final  . Glucose, UA 03/23/2022 >=500 (A)  NEGATIVE mg/dL Final  . Hgb urine dipstick 03/23/2022 NEGATIVE  NEGATIVE Final  . Bilirubin Urine 03/23/2022 NEGATIVE  NEGATIVE Final  . Ketones, ur 03/23/2022 NEGATIVE  NEGATIVE mg/dL Final  . Protein, ur 03/23/2022 NEGATIVE  NEGATIVE mg/dL Final  . Nitrite 03/23/2022 NEGATIVE  NEGATIVE Final  . Chalmers Guest 03/23/2022 NEGATIVE  NEGATIVE Final  . Bacteria, UA 03/23/2022 NONE SEEN  NONE SEEN Final  . Squamous Epithelial / LPF 03/23/2022 0-5  0 - 5 Final   Performed at [facility], [place] [address], [place], [place] [address]  Admission on 02/17/2022, Discharged on 02/17/2022  Component Date Value Ref Range Status  . Neisseria Gonorrhea 02/17/2022 Negative   Final  . Chlamydia 02/17/2022 Negative   Final  . Trichomonas 02/17/2022 Negative   Final  . Bacterial Vaginitis (gardnerella) 02/17/2022 Positive (A)   Final  . Candida Vaginitis 02/17/2022 Positive (A)   Final  . Candida Glabrata 02/17/2022 Negative   Final  .  Comment 02/17/2022 Normal Reference Range Bacterial  Vaginosis - Negative   Final  . Comment 02/17/2022 Normal Reference Range Candida Species - Negative   Final  . Comment 02/17/2022 Normal Reference Range Candida Galbrata - Negative   Final  . Comment 02/17/2022 Normal Reference Range Trichomonas - Negative   Final  . Comment 02/17/2022 Normal Reference Ranger Chlamydia - Negative   Final  . Comment 02/17/2022 Normal Reference Range Neisseria Gonorrhea - Negative   Final  Admission on 02/11/2022, Discharged on 02/11/2022  Component Date Value Ref Range Status  . SARS Coronavirus 2 by RT PCR 02/11/2022 POSITIVE (A)  NEGATIVE Final   Comment: (NOTE) SARS-CoV-2 target nucleic acids are DETECTED  SARS-CoV-2 RNA is generally detectable in upper respiratory specimens  during the acute phase of infection.  Positive results are indicative  of the presence of the identified virus, but do not rule out bacterial infection or co-infection with other pathogens not detected by the test.  Clinical correlation with patient history and  other diagnostic information is necessary to determine patient infection status.  The expected result is negative.  Fact Sheet for Patients:   https://www.patel.info/   Fact Sheet for Healthcare Providers:   https://hall.com/    This test is not yet approved or cleared by the [place] FDA and  has been authorized for detection and/or diagnosis of SARS-CoV-2 by FDA under an Emergency Use Authorization (EUA).  This EUA will remain in effect (meaning this test can be used) for the duration of  the COVID-19 declaration under Section 564(b)(1)                           of the Act, 21 U.S.C. section 360-bbb-3(b)(1), unless the authorization is terminated or revoked sooner.   Performed at [facility], [place] [address], [place], [place] [address]   . Group A Strep by PCR 02/11/2022 NOT DETECTED  NOT  DETECTED Final   Performed at [place] [address], [place], [place] [address]  Admission on 01/22/2022, Discharged on 01/22/2022  Component Date Value Ref Range Status  . Glucose-Capillary 01/22/2022 122 (H)  70 - 99 mg/dL Final   Glucose reference range applies only to samples taken after fasting for at least 8 hours.  . Sodium 01/22/2022 139  135 - 145 mmol/L Final  . Potassium 01/22/2022 3.5  3.5 - 5.1 mmol/L Final  . Chloride 01/22/2022 109  98 - 111 mmol/L Final  . CO2 01/22/2022 23  22 - 32 mmol/L Final  . Glucose, Bld 01/22/2022 98  70 - 99 mg/dL Final   Glucose reference range applies only to samples taken after fasting for at least 8 hours.  . BUN 01/22/2022 14  6 - 20 mg/dL Final  . Creatinine, Ser 01/22/2022 0.88  0.44 - 1.00 mg/dL Final  . Calcium 01/22/2022 9.4  8.9 - 10.3 mg/dL Final  . Total Protein 01/22/2022 7.5  6.5 - 8.1 g/dL Final  . Albumin 01/22/2022 4.1  3.5 - 5.0 g/dL Final  . AST 01/22/2022 42 (H)  15 - 41 U/L Final  . ALT 01/22/2022 24  0 - 44 U/L Final  . Alkaline Phosphatase 01/22/2022 53  38 - 126 U/L Final  . Total Bilirubin 01/22/2022 0.4  0.3 - 1.2 mg/dL Final  . GFR, Estimated 01/22/2022 >60  >60 mL/min Final   Comment: (NOTE) Calculated using the CKD-EPI Creatinine Equation (2021)   . Anion gap 01/22/2022 7  5 - 15 Final  Performed at [facility], [place] [address], [place], [place] [address]  . Salicylate Lvl 63/87/5643 <7.0 (L)  7.0 - 30.0 mg/dL Final   Performed at [place] [address], [place], [place] [address]  . Acetaminophen (Tylenol), Serum 01/22/2022 <10 (L)  10 - 30 ug/mL Final   Comment: (NOTE) Therapeutic concentrations vary significantly. A range of 10-30 ug/mL  may be an effective concentration for many patients. However, some  are best treated at concentrations outside of this range. Acetaminophen concentrations >150 ug/mL at 4 hours after ingestion  and >50  ug/mL at 12 hours after ingestion are often associated with  toxic reactions.  Performed at [facility], [place] [address] [place], [place] [address]   . Alcohol, Ethyl (B) 01/22/2022 <10  <10 mg/dL Final   Comment: (NOTE) Lowest detectable limit for serum alcohol is 10 mg/dL.  For medical purposes only. Performed at [facility], [place] [address] [place], [place] [address]   . Opiates 01/22/2022 NONE DETECTED  NONE DETECTED Final  . Cocaine 01/22/2022 NONE DETECTED  NONE DETECTED Final  . Benzodiazepines 01/22/2022 NONE DETECTED  NONE DETECTED Final  . Amphetamines 01/22/2022 NONE DETECTED  NONE DETECTED Final  . Tetrahydrocannabinol 01/22/2022 NONE DETECTED  NONE DETECTED Final  . Barbiturates 01/22/2022 NONE DETECTED  NONE DETECTED Final   Comment: (NOTE) DRUG SCREEN FOR MEDICAL PURPOSES ONLY.  IF CONFIRMATION IS NEEDED FOR ANY PURPOSE, NOTIFY LAB WITHIN 5 DAYS.  LOWEST DETECTABLE LIMITS FOR URINE DRUG SCREEN Drug Class                     Cutoff (ng/mL) Amphetamine and metabolites    1000 Barbiturate and metabolites    200 Benzodiazepine                 160 Tricyclics and metabolites     300 Opiates and metabolites        300 Cocaine and metabolites        300 THC                            50 Performed at [facility], [place] [address], [place], [place] [address]   . WBC 01/22/2022 11.6 (H)  4.0 - 10.5 K/uL Final  . RBC 01/22/2022 4.51  3.87 - 5.11 MIL/uL Final  . Hemoglobin 01/22/2022 11.3 (L)  12.0 - 15.0 g/dL Final  . HCT 01/22/2022 36.3  36.0 - 46.0 % Final  . MCV 01/22/2022 80.5  80.0 - 100.0 fL Final  . MCH 01/22/2022 25.1 (L)  26.0 - 34.0 pg Final  . MCHC 01/22/2022 31.1  30.0 - 36.0 g/dL Final  . RDW 01/22/2022 16.3 (H)  11.5 - 15.5 % Final  . Platelets 01/22/2022 263  150 - 400 K/uL Final  . nRBC 01/22/2022 0.0  0.0 - 0.2 % Final  . Neutrophils Relative % 01/22/2022 56  % Final  . Neutro Abs  01/22/2022 6.5  1.7 - 7.7 K/uL Final  . Lymphocytes Relative 01/22/2022 33  % Final  . Lymphs Abs 01/22/2022 3.9  0.7 - 4.0 K/uL Final  . Monocytes Relative 01/22/2022 7  % Final  . Monocytes Absolute 01/22/2022 0.8  0.1 - 1.0 K/uL Final  . Eosinophils Relative 01/22/2022 3  % Final  . Eosinophils Absolute 01/22/2022 0.4  0.0 - 0.5 K/uL Final  . Basophils Relative 01/22/2022 1  %  Final  . Basophils Absolute 01/22/2022 0.1  0.0 - 0.1 K/uL Final  . Immature Granulocytes 01/22/2022 0  % Final  . Abs Immature Granulocytes 01/22/2022 0.05  0.00 - 0.07 K/uL Final   Performed at [facility], [place] [address], [place], [place] [address]  . Lithium Lvl 01/22/2022 0.20 (L)  0.60 - 1.20 mmol/L Final   Performed at [facility] [address], [place], [place] [address]  . Preg Test, Ur 01/22/2022 NEGATIVE  NEGATIVE Final   Comment:        THE SENSITIVITY OF THIS METHODOLOGY IS >20 mIU/mL. Performed at [facility], [place] [address] [place], [place] [address]   . SARS Coronavirus 2 by RT PCR 01/22/2022 NEGATIVE  NEGATIVE Final   Comment: (NOTE) SARS-CoV-2 target nucleic acids are NOT DETECTED.  The SARS-CoV-2 RNA is generally detectable in upper respiratory specimens during the acute phase of infection. The lowest concentration of SARS-CoV-2 viral copies this assay can detect is 138 copies/mL. A negative result does not preclude SARS-Cov-2 infection and should not be used as the sole basis for treatment or other patient management decisions. A negative result may occur with  improper specimen collection/handling, submission of specimen other than nasopharyngeal swab, presence of viral mutation(s) within the areas targeted by this assay, and inadequate number of viral copies(<138 copies/mL). A negative result must be combined with clinical observations, patient history, and epidemiological information. The expected result is  Negative.  Fact Sheet for Patients:  EntrepreneurPulse.com.au  Fact Sheet for Healthcare Providers:  IncredibleEmployment.be  This test is no                          t yet approved or cleared by the [place] FDA and  has been authorized for detection and/or diagnosis of SARS-CoV-2 by FDA under an Emergency Use Authorization (EUA). This EUA will remain  in effect (meaning this test can be used) for the duration of the COVID-19 declaration under Section 564(b)(1) of the Act, 21 U.S.C.section 360bbb-3(b)(1), unless the authorization is terminated  or revoked sooner.      . Influenza A by PCR 01/22/2022 NEGATIVE  NEGATIVE Final  . Influenza B by PCR 01/22/2022 NEGATIVE  NEGATIVE Final   Comment: (NOTE) The Xpert Xpress SARS-CoV-2/FLU/RSV plus assay is intended as an aid in the diagnosis of influenza from Nasopharyngeal swab specimens and should not be used as a sole basis for treatment. Nasal washings and aspirates are unacceptable for Xpert Xpress SARS-CoV-2/FLU/RSV testing.  Fact Sheet for Patients: EntrepreneurPulse.com.au  Fact Sheet for Healthcare Providers: IncredibleEmployment.be  This test is not yet approved or cleared by the [place] FDA and has been authorized for detection and/or diagnosis of SARS-CoV-2 by FDA under an Emergency Use Authorization (EUA). This EUA will remain in effect (meaning this test can be used) for the duration of the COVID-19 declaration under Section 564(b)(1) of the Act, 21 U.S.C. section 360bbb-3(b)(1), unless the authorization is terminated or revoked.  Performed at [facility], [place] [address], [place], [place] [address]   Admission on 01/10/2022, Discharged on 01/11/2022  Component Date Value Ref Range Status  . SARS Coronavirus 2 by RT PCR 01/10/2022 NEGATIVE  NEGATIVE Final   Comment: (NOTE) SARS-CoV-2 target nucleic acids are NOT  DETECTED.  The SARS-CoV-2 RNA is generally detectable in upper respiratory specimens during the acute phase of infection. The lowest concentration of SARS-CoV-2 viral copies this assay can  detect is 138 copies/mL. A negative result does not preclude SARS-Cov-2 infection and should not be used as the sole basis for treatment or other patient management decisions. A negative result may occur with  improper specimen collection/handling, submission of specimen other than nasopharyngeal swab, presence of viral mutation(s) within the areas targeted by this assay, and inadequate number of viral copies(<138 copies/mL). A negative result must be combined with clinical observations, patient history, and epidemiological information. The expected result is Negative.  Fact Sheet for Patients:  EntrepreneurPulse.com.au  Fact Sheet for Healthcare Providers:  IncredibleEmployment.be  This test is no                          t yet approved or cleared by the [place] FDA and  has been authorized for detection and/or diagnosis of SARS-CoV-2 by FDA under an Emergency Use Authorization (EUA). This EUA will remain  in effect (meaning this test can be used) for the duration of the COVID-19 declaration under Section 564(b)(1) of the Act, 21 U.S.C.section 360bbb-3(b)(1), unless the authorization is terminated  or revoked sooner.      . Influenza A by PCR 01/10/2022 NEGATIVE  NEGATIVE Final  . Influenza B by PCR 01/10/2022 NEGATIVE  NEGATIVE Final   Comment: (NOTE) The Xpert Xpress SARS-CoV-2/FLU/RSV plus assay is intended as an aid in the diagnosis of influenza from Nasopharyngeal swab specimens and should not be used as a sole basis for treatment. Nasal washings and aspirates are unacceptable for Xpert Xpress SARS-CoV-2/FLU/RSV testing.  Fact Sheet for Patients: EntrepreneurPulse.com.au  Fact Sheet for Healthcare  Providers: IncredibleEmployment.be  This test is not yet approved or cleared by the [place] FDA and has been authorized for detection and/or diagnosis of SARS-CoV-2 by FDA under an Emergency Use Authorization (EUA). This EUA will remain in effect (meaning this test can be used) for the duration of the COVID-19 declaration under Section 564(b)(1) of the Act, 21 U.S.C. section 360bbb-3(b)(1), unless the authorization is terminated or revoked.  Performed at [facility], [place] [address] [place],  [address]   . WBC 01/10/2022 11.0 (H)  4.0 - 10.5 K/uL Final  . RBC 01/10/2022 4.43  3.87 - 5.11 MIL/uL Final  . Hemoglobin 01/10/2022 10.8 (L)  12.0 - 15.0 g/dL Final  . HCT 01/10/2022 35.0 (L)  36.0 - 46.0 % Final  . MCV 01/10/2022 79.0 (L)  80.0 - 100.0 fL Final  . MCH 01/10/2022 24.4 (L)  26.0 - 34.0 pg Final  . MCHC 01/10/2022 30.9  30.0 - 36.0 g/dL Final  . RDW 01/10/2022 16.4 (H)  11.5 - 15.5 % Final  . Platelets 01/10/2022 280  150 - 400 K/uL Final   REPEATED TO VERIFY  . nRBC 01/10/2022 0.0  0.0 - 0.2 % Final  . Neutrophils Relative % 01/10/2022 54  % Final  . Neutro Abs 01/10/2022 6.0  1.7 - 7.7 K/uL Final  . Lymphocytes Relative 01/10/2022 35  % Final  . Lymphs Abs 01/10/2022 3.8  0.7 - 4.0 K/uL Final  . Monocytes Relative 01/10/2022 6  % Final  . Monocytes Absolute 01/10/2022 0.6  0.1 - 1.0 K/uL Final  . Eosinophils Relative 01/10/2022 4  % Final  . Eosinophils Absolute 01/10/2022 0.4  0.0 - 0.5 K/uL Final  . Basophils Relative 01/10/2022 1  % Final  . Basophils Absolute 01/10/2022 0.1  0.0 - 0.1 K/uL Final  . Immature Granulocytes 01/10/2022 0  % Final  .  Abs Immature Granulocytes 01/10/2022 0.04  0.00 - 0.07 K/uL Final   Performed at [facility], [place] [address] [place], [place] [address]  . Sodium 01/10/2022 140  135 - 145 mmol/L Final  . Potassium 01/10/2022 3.6  3.5 - 5.1 mmol/L Final  . Chloride 01/10/2022 104  98 - 111 mmol/L  Final  . CO2 01/10/2022 25  22 - 32 mmol/L Final  . Glucose, Bld 01/10/2022 267 (H)  70 - 99 mg/dL Final   Glucose reference range applies only to samples taken after fasting for at least 8 hours.  . BUN 01/10/2022 8  6 - 20 mg/dL Final  . Creatinine, Ser 01/10/2022 0.79  0.44 - 1.00 mg/dL Final  . Calcium 01/10/2022 9.8  8.9 - 10.3 mg/dL Final  . Total Protein 01/10/2022 7.0  6.5 - 8.1 g/dL Final  . Albumin 01/10/2022 3.9  3.5 - 5.0 g/dL Final  . AST 01/10/2022 26  15 - 41 U/L Final  . ALT 01/10/2022 21  0 - 44 U/L Final  . Alkaline Phosphatase 01/10/2022 52  38 - 126 U/L Final  . Total Bilirubin 01/10/2022 0.6  0.3 - 1.2 mg/dL Final  . GFR, Estimated 01/10/2022 >60  >60 mL/min Final   Comment: (NOTE) Calculated using the CKD-EPI Creatinine Equation (2021)   . Anion gap 01/10/2022 11  5 - 15 Final   Performed at [facility] [address] [place], [place] [address]  . Hgb A1c MFr Bld 01/10/2022 7.6 (H)  4.8 - 5.6 % Final   Comment: (NOTE) Pre diabetes:          5.7%-6.4%  Diabetes:              >6.4%  Glycemic control for   <7.0% adults with diabetes   . Mean Plasma Glucose 01/10/2022 171.42  mg/dL Final   Performed at [place] [address], [place], [place] [address]  . Magnesium 01/10/2022 2.0  1.7 - 2.4 mg/dL Final   Performed at [place] [address], [place], [place] [address]  . Alcohol, Ethyl (B) 01/10/2022 <10  <10 mg/dL Final   Comment: (NOTE) Lowest detectable limit for serum alcohol is 10 mg/dL.  For medical purposes only. Performed at [facility], [place] [address], [place], [place] [address]   . TSH 01/10/2022 0.983  0.350 - 4.500 uIU/mL Final   Comment: Performed by a 3rd Generation assay with a functional sensitivity of <=0.01 uIU/mL. Performed at [facility], [place] [address], [place], [place] [address]   . Lithium Lvl 01/10/2022 0.18 (L)  0.60 - 1.20 mmol/L Final   Performed at [facility], [place]  [address] [place], [place] [address]  . SARS Coronavirus 2 Ag 01/10/2022 Negative  Negative Preliminary  . SARSCOV2ONAVIRUS 2 AG 01/10/2022 NEGATIVE  NEGATIVE Final   Comment: (NOTE) SARS-CoV-2 antigen NOT DETECTED.   Negative results are presumptive.  Negative results do not preclude SARS-CoV-2 infection and should not be used as the sole basis for treatment or other patient management decisions, including infection  control decisions, particularly in the presence of clinical signs and  symptoms consistent with COVID-19, or in those who have been in contact with the virus.  Negative results must be combined with clinical observations, patient history, and epidemiological information. The expected result is Negative.  Fact Sheet for Patients: HandmadeRecipes.com.cy  Fact Sheet for Healthcare Providers: FuneralLife.at  This test is not yet  approved or cleared by the [place] and  has been authorized for detection and/or diagnosis of SARS-CoV-2 by FDA under an Emergency Use Authorization (EUA).  This EUA will remain in effect (meaning this test can be used) for the duration of  the COV                          ID-19 declaration under Section 564(b)(1) of the Act, 21 U.S.C. section 360bbb-3(b)(1), unless the authorization is terminated or revoked sooner.    . Glucose-Capillary 01/10/2022 279 (H)  70 - 99 mg/dL Final   Glucose reference range applies only to samples taken after fasting for at least 8 hours.  There may be more visits with results that are not included.    Allergies: Wellbutrin [bupropion], Omnipaque [iohexol], Penicillins, Atarax [hydroxyzine], Contrast media [iodinated contrast media], and Cymbalta [duloxetine hcl]  PTA Medications:   Prior to Admission medications   Medication Sig Start Date End Date Taking? Authorizing Provider  benztropine (COGENTIN) 0.5 MG tablet Take 0.5 mg by mouth 2 (two) times daily.     [provider]  Cholecalciferol (VITAMIN D-3 PO) Take 1 tablet by mouth daily.    [provider]  fluconazole (DIFLUCAN) 150 MG tablet Take 1 tablet (150 mg total) by mouth daily. One qd x 3 days, then repeat again the same way next week 03/26/22   Rodriguez-Southworth, Sunday Spillers, PA-C  gabapentin (NEURONTIN) 100 MG capsule Take 100 mg by mouth 3 (three) times daily.    [provider]  haloperidol (HALDOL) 10 MG tablet Take 10 mg by mouth at bedtime. Take with 30m tablet for 171mdose.    [provider]  haloperidol (HALDOL) 5 MG tablet Take 5 mg by mouth at bedtime. Take with 1062mablet for 53m26mse.    [provider]  haloperidol decanoate (HALDOL DECANOATE) 100 MG/ML injection Inject 150 mg into the muscle every 30 (thirty) days.    [provider]  insulin detemir (LEVEMIR) 100 UNIT/ML injection Inject 0.4 mLs (40 Units total) into the skin 2 (two) times daily. 10/14/21   OnuoDelfin Gant  lithium carbonate (ESKALITH) 450 MG CR tablet Take 450 mg by mouth at bedtime.    [provider]  metoprolol tartrate (LOPRESSOR) 25 MG tablet Take 12.5 mg by mouth 2 (two) times daily.    [provider]  miconazole (MONISTAT 1 COMBO PACK) kit Place 1 each vaginally once for 1 dose. One applicator per week for prevention of yeast infection 03/08/23 03/08/23  Rodriguez-Southworth, SylvSunday Spillers-C  nystatin cream (MYCOSTATIN) Apply 1 Application topically 2 (two) times daily. Applying vaginally 01/07/22   [provider]           Medical Decision Making  Recommend admission to Inpatient Psychiatry for stabilization and treatment. Patient currently admitted to the continuous observation unit for safety monitoring pending inpatient psychiatric hospitalization.     [facility].    Lab Orders         Resp panel by RT-PCR (RSV, Flu A&B, Covid) Anterior Nasal Swab          Pregnancy, urine         POCT Urine Drug Screen       Lipid panel      EKG  Home medication restarted this encounter - Cogentin 0.5mg 75mBID for EPS prophylaxis - Haldol 10mg 4maily at bedtime  for mood stabilization -Metropolol 12.39m PO BID Hypertension - SSI AC&HS- Type II diabetes   PRN:  Tylenol PO 6511mq6hr PRN pain MAALOX 3036mO q4hr PRN for indigestion MOM 59m18m daily PRN for constipation Trazodone 50mg63mPRN sleep     Recommendations  Based on my evaluation the patient does not appear to have an emergency medical condition.  Recommend admission to Inpatient Psychiatry for stabilization and treatment. Patient currently admitted to the continuous observation unit for safety monitoring pending inpatient psychiatric hospitalization.   BukolEarney Mallet12/14/23  10:48 PM

## 2022-04-10 NOTE — Progress Notes (Signed)
Pt was accepted to [facility] 04/10/2022. Bed assignment: 034-7  Pt meets inpatient criteria per Darrol Angel, NP  Attending Physician will be Janine Limbo, MD   Report can be called to:  -Adult unit: [phone]  Bed is ready now  Care Team Notified: Darrol Angel, NP, Leonia Reader, RN, and [name] [place], [place]  04/10/2022 11:05 AM

## 2022-04-10 NOTE — Discharge Instructions (Signed)
Transfer to [facility] 

## 2022-04-10 NOTE — ED Notes (Signed)
Patient observed resting in bed. Patient appears with flat affect and depressed mood. Patient got up to eat breakfast and is med compliant. Patient endorses passive SI with no plan/intent. Patient denies HI and A/V/H at this time. Patient remains cooperative on unit.

## 2022-04-10 NOTE — Progress Notes (Signed)
Adult Psychoeducational Group Note  Date:  04/10/2022 Time:  8:33 PM  Group Topic/Focus:  Wrap-Up Group:   The focus of this group is to help patients review their daily goal of treatment and discuss progress on daily workbooks.  Participation Level:  Active  Participation Quality:  Appropriate  Affect:  Appropriate  Cognitive:  Appropriate  Insight: Appropriate  Engagement in Group:  Developing/Improving  Modes of Intervention:  Discussion  Additional Comments:  Pt stated her goal for today was to focus on her treatment plan. Pt stated she accomplished her goal today. Pt stated she did not get a chance to speak with a doctor or with a social worker about her care today. Pt rated her overall day a 8 out of 10. Pt stated she was able to contact her mother today which improved her overall day. Pt stated she felt better about herself tonight. Pt stated staff brought back all her meals today. Pt stated she took all medications provided today. Pt stated her appetite was pretty good today. Pt rated her sleep last night was pretty good. Pt stated the goal tonight was to get some rest. Pt stated she had no physical pain tonight. Pt admitted to dealing with visual hallucinations and auditory issues tonight. Pt nurse was updated on situation. Pt admitted to having thoughts of harming herself but not others. Pt stated she could contract for safety. Pt nurse was updated on situation. Pt stated she would alert staff if anything changed.  Candy Sledge 04/10/2022, 8:33 PM

## 2022-04-10 NOTE — Inpatient Diabetes Management (Signed)
Inpatient Diabetes Program Recommendations  AACE/ADA: New Consensus Statement on Inpatient Glycemic Control (2015)  Target Ranges:  Prepandial:   less than 140 mg/dL      Peak postprandial:   less than 180 mg/dL (1-2 hours)      Critically ill patients:  140 - 180 mg/dL    Latest Reference Range & Units 09/28/21 21:30 01/10/22 17:39  Hemoglobin A1C 4.8 - 5.6 % 5.7 (H) 7.6 (H)  (H): Data is abnormally high  Latest Reference Range & Units 04/09/22 23:12 04/10/22 08:10 04/10/22 11:29 04/10/22 11:49  Glucose-Capillary 70 - 99 mg/dL 230 (H)  2 units Novolog  201 (H)  3 units Novolog  208 (H) 207 (H)  3 units Novolog   (H): Data is abnormally high    Admit with:  "Pt presents to [facility] voluntarily, accompanied by [employer] with suicidal ideations with a plan to overdose on medications. Pt reports calling the police to her home tonight due to depression and dealing with "demons and witchcraft"   History: DM, Schizophrenia  Home DM Meds: Amaryl 4 mg daily       Glipizide 10 mg daily       70/30 Insulin 20 units BID  Current Orders: Novolog Sensitive Correction Scale/ SSI (0-9 units) TID AC + HS   MD- Note pt allowed PO diet.  If she is eating OK, recommend we start with 50% home dose 70/30 insulin:  70/30 Insulin 10 units BID with Breakfast and Dinner  Continue current Novolog SSI as well    --Will follow patient during hospitalization--  Wyn Quaker RN, MSN, [place] Diabetes Coordinator Inpatient Glycemic Control Team Team Pager: [phone] (8a-5p)

## 2022-04-10 NOTE — ED Notes (Signed)
Pt A&O x 4, presents with suicidal ideations, passive SI, no plan noted.  Pt believes there are Demons in her shoes.  Complaint of depression and dealing with Demons and witchcraft.  Pt calm & cooperative. No distress noted.  Monitoring for safety.

## 2022-04-10 NOTE — ED Provider Notes (Signed)
FBC/OBS ASAP Discharge Summary  Date and Time: 04/10/2022 10:42 AM Name: [name] MRN:  [id number]  Subjective: [name] is a 31 year old female patient with a past psychiatric history significant for schizoaffective disorder bipolar type, MDD, GAD, borderline ID and suicide attempts who initially presented to the [facility] on 04/09/22 accompanied by GPD with complaints of worsening depression and suicidal ideations with a plan to overdose on medications.   Patient seen and reevaluated face-to-face by this provider, and chart reviewed. On evaluation, patient is alert and oriented x 4. Her thought process is linear and speech is clear and coherent at a decreased tone. Her mood is depressed and affect is congruent. She has fair eye contact. She is calm and cooperative. She continues to endorse suicidal ideations with a plan to overdose on her medications or cut her veins. She is unable to contract for safety. She reports worsening depression for the past 2 weeks and describes her depressive symptoms as feelings of sadness, crying spells and anger. She denies recent stressors or triggers attributing to her symptoms. She states, "sometimes it just comes on me." She denies HI. She denies AVH but reports feeling like demons are in her shoes. There is no objective evidence that the patient is currently responding to internal or external stimuli. She states that she is compliant with taking her medications and that she last followed up with [facility] yesterday. She states that Dr. Sheppard Evens did not make any new medication changes yesterday. She states that she takes 2 big Crucita Lacorte pills that she is unable to recall, lithium 450 mg at bedtime, BuSpar/unknown dose, Haldol/unknown dose, Cogentin/unknown dose, and insulin. She states that she reside alone in her apartment. She states that she was doing well until two weeks ago.   Diagnosis:  Final diagnoses:  Suicidal ideation  Schizoaffective  disorder, depressive type ([place])    Total Time spent with patient: 30 minutes  Past Psychiatric History: history of schizoaffective disorder, bipolar type, MDD, schizophrenia, borderline ID, GAD and suicide attempts.  Past Medical History:  Past Medical History:  Diagnosis Date   Anxiety    Bipolar 1 disorder ([place])    Cognitive deficits    Depression    Diabetes mellitus without complication ([place])    Hypertension    Mental disorder    Mental health disorder    Obesity     Past Surgical History:  Procedure Laterality Date   CESAREAN SECTION     CESAREAN SECTION N/A 04/25/2013   Procedure: REPEAT CESAREAN SECTION;  Surgeon: [name], MD;  Location: [facility];  Service: Obstetrics;  Laterality: N/A;   MASS EXCISION N/A 06/03/2012   Procedure: EXCISION MASS;  Surgeon: [name], MD;  Location: [place];  Service: ENT;  Laterality: N/A;  Excision uvula mass   TONSILLECTOMY N/A 06/03/2012   Procedure: TONSILLECTOMY;  Surgeon: [name], MD;  Location: [facility];  Service: ENT;  Laterality: N/A;   TONSILLECTOMY     Family History:  Family History  Problem Relation Age of Onset   Hypertension Mother    Diabetes Father    Family Psychiatric  History: No history reported.  Social History:  Social History   Substance and Sexual Activity  Alcohol Use Not Currently     Social History   Substance and Sexual Activity  Drug Use Not Currently   Types: "Crack" cocaine, Other-see comments   Comment: Patient reports hx of smoking Crack    Social History  Socioeconomic History   Marital status: Single    Spouse name: Not on file   Number of children: Not on file   Years of education: Not on file   Highest education level: Not on file  Occupational History   Not on file  Tobacco Use   Smoking status: Every Day    Types: Cigars   Smokeless tobacco: Never   Tobacco comments:    Pt declined  Vaping Use   Vaping Use: Never used   Substance and Sexual Activity   Alcohol use: Not Currently   Drug use: Not Currently    Types: "Crack" cocaine, Other-see comments    Comment: Patient reports hx of smoking Crack   Sexual activity: Not Currently    Birth control/protection: None    Comment: occasionally  Other Topics Concern   Not on file  Social History Narrative   ** Merged History Encounter **       Social Determinants of Health   Financial Resource Strain: Not on file  Food Insecurity: Not on file  Transportation Needs: Not on file  Physical Activity: Not on file  Stress: Not on file  Social Connections: Not on file   SDOH:  SDOH Screenings   Alcohol Screen: Low Risk  (05/02/2020)  Depression (PHQ2-9): High Risk (04/09/2022)  Tobacco Use: High Risk (04/06/2022)   Additional Social History:    Pain Medications: None Prescriptions: Pt cannot recall all of them Over the Counter: Unknown History of alcohol / drug use?: No history of alcohol / drug abuse Longest period of sobriety (when/how long): N/A Withdrawal Symptoms: None    Current Medications:  Current Facility-Administered Medications  Medication Dose Route Frequency Provider Last Rate Last Admin   acetaminophen (TYLENOL) tablet 650 mg  650 mg Oral Q6H PRN Adegbola, Bukola O, NP       alum & mag hydroxide-simeth (MAALOX/MYLANTA) 200-200-20 MG/5ML suspension 30 mL  30 mL Oral Q4H PRN Adegbola, Bukola O, NP       benztropine (COGENTIN) tablet 0.5 mg  0.5 mg Oral BID Adegbola, Bukola O, NP   0.5 mg at 04/10/22 0908   haloperidol (HALDOL) tablet 10 mg  10 mg Oral QHS Adegbola, Bukola O, NP   10 mg at 04/10/22 0020   insulin aspart (novoLOG) injection 0-5 Units  0-5 Units Subcutaneous QHS Adegbola, Bukola O, NP   2 Units at 04/10/22 0021   insulin aspart (novoLOG) injection 0-9 Units  0-9 Units Subcutaneous TID WC Adegbola, Bukola O, NP   3 Units at 04/10/22 0900   magnesium hydroxide (MILK OF MAGNESIA) suspension 30 mL  30 mL Oral Daily PRN  Adegbola, Bukola O, NP       metoprolol tartrate (LOPRESSOR) tablet 12.5 mg  12.5 mg Oral BID Adegbola, Bukola O, NP   12.5 mg at 04/10/22 0908   traZODone (DESYREL) tablet 50 mg  50 mg Oral QHS PRN Adegbola, Hassan Rowan, NP   50 mg at 04/10/22 0020   Current Outpatient Medications  Medication Sig Dispense Refill   benztropine (COGENTIN) 0.5 MG tablet Take 0.5 mg by mouth 2 (two) times daily.     metoprolol tartrate (LOPRESSOR) 25 MG tablet Take 12.5 mg by mouth 2 (two) times daily.     Cholecalciferol (VITAMIN D-3 PO) Take 1 tablet by mouth daily.     fluconazole (DIFLUCAN) 150 MG tablet Take 1 tablet (150 mg total) by mouth daily. One qd x 3 days, then repeat again the same way next week 6  tablet 0   gabapentin (NEURONTIN) 100 MG capsule Take 100 mg by mouth 3 (three) times daily.     haloperidol (HALDOL) 10 MG tablet Take 10 mg by mouth at bedtime. Take with 42m tablet for 167mdose.     haloperidol (HALDOL) 5 MG tablet Take 5 mg by mouth at bedtime. Take with 1069mablet for 37m56mse.     haloperidol decanoate (HALDOL DECANOATE) 100 MG/ML injection Inject 150 mg into the muscle every 30 (thirty) days.     insulin detemir (LEVEMIR) 100 UNIT/ML injection Inject 0.4 mLs (40 Units total) into the skin 2 (two) times daily. 10 mL 11   lithium carbonate (ESKALITH) 450 MG CR tablet Take 450 mg by mouth at bedtime.     [START ON 03/08/2023] miconazole (MONISTAT 1 COMBO PACK) kit Place 1 each vaginally once for 1 dose. One applicator per week for prevention of yeast infection 1 each 2   nystatin cream (MYCOSTATIN) Apply 1 Application topically 2 (two) times daily. Applying vaginally      Labs  Lab Results:  Admission on 04/09/2022  Component Date Value Ref Range Status   SARS Coronavirus 2 by RT PCR 04/09/2022 NEGATIVE  NEGATIVE Final   Comment: (NOTE) SARS-CoV-2 target nucleic acids are NOT DETECTED.  The SARS-CoV-2 RNA is generally detectable in upper respiratory specimens during the acute  phase of infection. The lowest concentration of SARS-CoV-2 viral copies this assay can detect is 138 copies/mL. A negative result does not preclude SARS-Cov-2 infection and should not be used as the sole basis for treatment or other patient management decisions. A negative result may occur with  improper specimen collection/handling, submission of specimen other than nasopharyngeal swab, presence of viral mutation(s) within the areas targeted by this assay, and inadequate number of viral copies(<138 copies/mL). A negative result must be combined with clinical observations, patient history, and epidemiological information. The expected result is Negative.  Fact Sheet for Patients:  httpEntrepreneurPulse.com.auct Sheet for Healthcare Providers:  httpIncredibleEmployment.beis test is no                          t yet approved or cleared by the [place] and  has been authorized for detection and/or diagnosis of SARS-CoV-2 by FDA under an Emergency Use Authorization (EUA). This EUA will remain  in effect (meaning this test can be used) for the duration of the COVID-19 declaration under Section 564(b)(1) of the Act, 21 U.S.C.section 360bbb-3(b)(1), unless the authorization is terminated  or revoked sooner.       Influenza A by PCR 04/09/2022 NEGATIVE  NEGATIVE Final   Influenza B by PCR 04/09/2022 NEGATIVE  NEGATIVE Final   Comment: (NOTE) The Xpert Xpress SARS-CoV-2/FLU/RSV plus assay is intended as an aid in the diagnosis of influenza from Nasopharyngeal swab specimens and should not be used as a sole basis for treatment. Nasal washings and aspirates are unacceptable for Xpert Xpress SARS-CoV-2/FLU/RSV testing.  Fact Sheet for Patients: httpEntrepreneurPulse.com.auct Sheet for Healthcare Providers: httpIncredibleEmployment.beis test is not yet approved or cleared by the [place] and has  been authorized for detection and/or diagnosis of SARS-CoV-2 by FDA under an Emergency Use Authorization (EUA). This EUA will remain in effect (meaning this test can be used) for the duration of the COVID-19 declaration under Section 564(b)(1) of the Act, 21 U.S.C. section 360bbb-3(b)(1), unless the authorization is terminated or revoked.     Resp  Syncytial Virus by PCR 04/09/2022 NEGATIVE  NEGATIVE Final   Comment: (NOTE) Fact Sheet for Patients: EntrepreneurPulse.com.au  Fact Sheet for Healthcare Providers: IncredibleEmployment.be  This test is not yet approved or cleared by the [place] FDA and has been authorized for detection and/or diagnosis of SARS-CoV-2 by FDA under an Emergency Use Authorization (EUA). This EUA will remain in effect (meaning this test can be used) for the duration of the COVID-19 declaration under Section 564(b)(1) of the Act, 21 U.S.C. section 360bbb-3(b)(1), unless the authorization is terminated or revoked.  Performed at [facility], [place] [address], [place], [place] [address]    POC Amphetamine UR 04/09/2022 None Detected  NONE DETECTED (Cut Off Level 1000 ng/mL) Final   POC Secobarbital (BAR) 04/09/2022 None Detected  NONE DETECTED (Cut Off Level 300 ng/mL) Final   POC Buprenorphine (BUP) 04/09/2022 None Detected  NONE DETECTED (Cut Off Level 10 ng/mL) Final   POC Oxazepam (BZO) 04/09/2022 None Detected  NONE DETECTED (Cut Off Level 300 ng/mL) Final   POC Cocaine UR 04/09/2022 None Detected  NONE DETECTED (Cut Off Level 300 ng/mL) Final   POC Methamphetamine UR 04/09/2022 None Detected  NONE DETECTED (Cut Off Level 1000 ng/mL) Final   POC Morphine 04/09/2022 None Detected  NONE DETECTED (Cut Off Level 300 ng/mL) Final   POC Methadone UR 04/09/2022 None Detected  NONE DETECTED (Cut Off Level 300 ng/mL) Final   POC Oxycodone UR 04/09/2022 None Detected  NONE DETECTED (Cut Off Level 100 ng/mL) Final   POC  Marijuana UR 04/09/2022 None Detected  NONE DETECTED (Cut Off Level 50 ng/mL) Final   Preg Test, Ur 04/09/2022 NEGATIVE  NEGATIVE Final   Comment:        THE SENSITIVITY OF THIS METHODOLOGY IS >24 mIU/mL    Glucose-Capillary 04/09/2022 230 (H)  70 - 99 mg/dL Final   Glucose reference range applies only to samples taken after fasting for at least 8 hours.   Glucose-Capillary 04/10/2022 201 (H)  70 - 99 mg/dL Final   Glucose reference range applies only to samples taken after fasting for at least 8 hours.  Admission on 04/06/2022, Discharged on 04/07/2022  Component Date Value Ref Range Status   Sodium 04/06/2022 138  135 - 145 mmol/L Final   Potassium 04/06/2022 3.8  3.5 - 5.1 mmol/L Final   Chloride 04/06/2022 105  98 - 111 mmol/L Final   CO2 04/06/2022 25  22 - 32 mmol/L Final   Glucose, Bld 04/06/2022 243 (H)  70 - 99 mg/dL Final   Glucose reference range applies only to samples taken after fasting for at least 8 hours.   BUN 04/06/2022 9  6 - 20 mg/dL Final   Creatinine, Ser 04/06/2022 0.84  0.44 - 1.00 mg/dL Final   Calcium 04/06/2022 9.5  8.9 - 10.3 mg/dL Final   Total Protein 04/06/2022 8.0  6.5 - 8.1 g/dL Final   Albumin 04/06/2022 4.1  3.5 - 5.0 g/dL Final   AST 04/06/2022 28  15 - 41 U/L Final   ALT 04/06/2022 21  0 - 44 U/L Final   Alkaline Phosphatase 04/06/2022 67  38 - 126 U/L Final   Total Bilirubin 04/06/2022 0.6  0.3 - 1.2 mg/dL Final   GFR, Estimated 04/06/2022 >60  >60 mL/min Final   Comment: (NOTE) Calculated using the CKD-EPI Creatinine Equation (2021)    Anion gap 04/06/2022 8  5 - 15 Final   Performed at [facility], [address] [place], [place] [address]   Alcohol, Ethyl (B) 04/06/2022 <10  <10 mg/dL Final   Comment: (NOTE) Lowest detectable limit for serum alcohol is 10 mg/dL.  For medical purposes only. Performed at [facility], [place] [address], [place], [place] [address]    Opiates 04/06/2022 NONE DETECTED   NONE DETECTED Final   Cocaine 04/06/2022 NONE DETECTED  NONE DETECTED Final   Benzodiazepines 04/06/2022 NONE DETECTED  NONE DETECTED Final   Amphetamines 04/06/2022 NONE DETECTED  NONE DETECTED Final   Tetrahydrocannabinol 04/06/2022 NONE DETECTED  NONE DETECTED Final   Barbiturates 04/06/2022 NONE DETECTED  NONE DETECTED Final   Comment: (NOTE) DRUG SCREEN FOR MEDICAL PURPOSES ONLY.  IF CONFIRMATION IS NEEDED FOR ANY PURPOSE, NOTIFY LAB WITHIN 5 DAYS.  LOWEST DETECTABLE LIMITS FOR URINE DRUG SCREEN Drug Class                     Cutoff (ng/mL) Amphetamine and metabolites    1000 Barbiturate and metabolites    200 Benzodiazepine                 200 Opiates and metabolites        300 Cocaine and metabolites        300 THC                            50 Performed at [facility], [place] [address] [place], [place] [address]    WBC 04/06/2022 10.7 (H)  4.0 - 10.5 K/uL Final   RBC 04/06/2022 4.66  3.87 - 5.11 MIL/uL Final   Hemoglobin 04/06/2022 11.2 (L)  12.0 - 15.0 g/dL Final   HCT 04/06/2022 36.2  36.0 - 46.0 % Final   MCV 04/06/2022 77.7 (L)  80.0 - 100.0 fL Final   MCH 04/06/2022 24.0 (L)  26.0 - 34.0 pg Final   MCHC 04/06/2022 30.9  30.0 - 36.0 g/dL Final   RDW 04/06/2022 15.3  11.5 - 15.5 % Final   Platelets 04/06/2022 259  150 - 400 K/uL Final   nRBC 04/06/2022 0.0  0.0 - 0.2 % Final   Neutrophils Relative % 04/06/2022 56  % Final   Neutro Abs 04/06/2022 6.0  1.7 - 7.7 K/uL Final   Lymphocytes Relative 04/06/2022 35  % Final   Lymphs Abs 04/06/2022 3.7  0.7 - 4.0 K/uL Final   Monocytes Relative 04/06/2022 5  % Final   Monocytes Absolute 04/06/2022 0.6  0.1 - 1.0 K/uL Final   Eosinophils Relative 04/06/2022 3  % Final   Eosinophils Absolute 04/06/2022 0.3  0.0 - 0.5 K/uL Final   Basophils Relative 04/06/2022 1  % Final   Basophils Absolute 04/06/2022 0.1  0.0 - 0.1 K/uL Final   Immature Granulocytes 04/06/2022 0  % Final   Abs Immature Granulocytes  04/06/2022 0.03  0.00 - 0.07 K/uL Final   Performed at [facility], [place] [address] [place], [place] [address]   I-stat hCG, quantitative 04/06/2022 <5.0  <5 mIU/mL Final   Comment 3 04/06/2022          Final   Comment:   GEST. AGE      CONC.  (mIU/mL)   <=1 WEEK        5 - 50     2 WEEKS       50 - 500     3 WEEKS  100 - 10,000     4 WEEKS     1,000 - 30,000        FEMALE AND NON-PREGNANT FEMALE:     LESS THAN 5 mIU/mL   Admission on 03/26/2022, Discharged on 03/26/2022  Component Date Value Ref Range Status   Glucose, UA 03/26/2022 >=1000 (A)  NEGATIVE mg/dL Final   Bilirubin Urine 03/26/2022 NEGATIVE  NEGATIVE Final   Ketones, ur 03/26/2022 NEGATIVE  NEGATIVE mg/dL Final   Specific Gravity, Urine 03/26/2022 >=1.030  1.005 - 1.030 Final   Hgb urine dipstick 03/26/2022 SMALL (A)  NEGATIVE Final   pH 03/26/2022 5.0  5.0 - 8.0 Final   Protein, ur 03/26/2022 30 (A)  NEGATIVE mg/dL Final   Urobilinogen, UA 03/26/2022 0.2  0.0 - 1.0 mg/dL Final   Nitrite 03/26/2022 NEGATIVE  NEGATIVE Final   Leukocytes,Ua 03/26/2022 NEGATIVE  NEGATIVE Final   Biochemical Testing Only. Please order routine urinalysis from main lab if confirmatory testing is needed.   Neisseria Gonorrhea 03/26/2022 Negative   Final   Chlamydia 03/26/2022 Negative   Final   Trichomonas 03/26/2022 Negative   Final   Bacterial Vaginitis (gardnerella) 03/26/2022 Negative   Final   Candida Vaginitis 03/26/2022 Positive (A)   Final   Candida Glabrata 03/26/2022 Negative   Final   Comment 03/26/2022 Normal Reference Range Bacterial Vaginosis - Negative   Final   Comment 03/26/2022 Normal Reference Range Candida Species - Negative   Final   Comment 03/26/2022 Normal Reference Range Candida Galbrata - Negative   Final   Comment 03/26/2022 Normal Reference Range Trichomonas - Negative   Final   Comment 03/26/2022 Normal Reference Ranger Chlamydia - Negative   Final   Comment 03/26/2022 Normal Reference  Range Neisseria Gonorrhea - Negative   Final  Admission on 03/26/2022, Discharged on 03/26/2022  Component Date Value Ref Range Status   Glucose-Capillary 03/26/2022 349 (H)  70 - 99 mg/dL Final   Glucose reference range applies only to samples taken after fasting for at least 8 hours.  Admission on 03/25/2022, Discharged on 03/25/2022  Component Date Value Ref Range Status   I-stat hCG, quantitative 03/25/2022 <5.0  <5 mIU/mL Final   Comment 3 03/25/2022          Final   Comment:   GEST. AGE      CONC.  (mIU/mL)   <=1 WEEK        5 - 50     2 WEEKS       50 - 500     3 WEEKS       100 - 10,000     4 WEEKS     1,000 - 30,000        FEMALE AND NON-PREGNANT FEMALE:     LESS THAN 5 mIU/mL    HIV Screen 4th Generation wRfx 03/25/2022 Non Reactive  Non Reactive Final   Performed at [facility], [place] [address] [place], [place] [address]   Glucose-Capillary 03/25/2022 318 (H)  70 - 99 mg/dL Final   Glucose reference range applies only to samples taken after fasting for at least 8 hours.   Sodium 03/25/2022 138  135 - 145 mmol/L Final   Potassium 03/25/2022 3.5  3.5 - 5.1 mmol/L Final   Chloride 03/25/2022 103  98 - 111 mmol/L Final   CO2 03/25/2022 27  22 - 32 mmol/L Final   Glucose, Bld 03/25/2022 323 (H)  70 - 99 mg/dL Final   Glucose reference  range applies only to samples taken after fasting for at least 8 hours.   BUN 03/25/2022 13  6 - 20 mg/dL Final   Creatinine, Ser 03/25/2022 0.82  0.44 - 1.00 mg/dL Final   Calcium 03/25/2022 8.9  8.9 - 10.3 mg/dL Final   Total Protein 03/25/2022 7.7  6.5 - 8.1 g/dL Final   Albumin 03/25/2022 4.1  3.5 - 5.0 g/dL Final   AST 03/25/2022 22  15 - 41 U/L Final   ALT 03/25/2022 22  0 - 44 U/L Final   Alkaline Phosphatase 03/25/2022 67  38 - 126 U/L Final   Total Bilirubin 03/25/2022 0.6  0.3 - 1.2 mg/dL Final   GFR, Estimated 03/25/2022 >60  >60 mL/min Final   Comment: (NOTE) Calculated using the CKD-EPI Creatinine Equation (2021)    Anion  gap 03/25/2022 8  5 - 15 Final   Performed at [facility], [place] [address] [place],  [place] [address]   WBC 03/25/2022 10.7 (H)  4.0 - 10.5 K/uL Final   RBC 03/25/2022 4.66  3.87 - 5.11 MIL/uL Final   Hemoglobin 03/25/2022 11.1 (L)  12.0 - 15.0 g/dL Final   HCT 03/25/2022 36.4  36.0 - 46.0 % Final   MCV 03/25/2022 78.1 (L)  80.0 - 100.0 fL Final   MCH 03/25/2022 23.8 (L)  26.0 - 34.0 pg Final   MCHC 03/25/2022 30.5  30.0 - 36.0 g/dL Final   RDW 03/25/2022 15.6 (H)  11.5 - 15.5 % Final   Platelets 03/25/2022 279  150 - 400 K/uL Final   nRBC 03/25/2022 0.0  0.0 - 0.2 % Final   Neutrophils Relative % 03/25/2022 54  % Final   Neutro Abs 03/25/2022 5.7  1.7 - 7.7 K/uL Final   Lymphocytes Relative 03/25/2022 36  % Final   Lymphs Abs 03/25/2022 3.9  0.7 - 4.0 K/uL Final   Monocytes Relative 03/25/2022 6  % Final   Monocytes Absolute 03/25/2022 0.7  0.1 - 1.0 K/uL Final   Eosinophils Relative 03/25/2022 3  % Final   Eosinophils Absolute 03/25/2022 0.3  0.0 - 0.5 K/uL Final   Basophils Relative 03/25/2022 1  % Final   Basophils Absolute 03/25/2022 0.1  0.0 - 0.1 K/uL Final   Immature Granulocytes 03/25/2022 0  % Final   Abs Immature Granulocytes 03/25/2022 0.04  0.00 - 0.07 K/uL Final   Performed at [facility], [place] [address] [place], [place] [address]  Admission on 03/23/2022, Discharged on 03/23/2022  Component Date Value Ref Range Status   Glucose-Capillary 03/23/2022 349 (H)  70 - 99 mg/dL Final   Glucose reference range applies only to samples taken after fasting for at least 8 hours.   WBC 03/23/2022 11.3 (H)  4.0 - 10.5 K/uL Final   RBC 03/23/2022 4.44  3.87 - 5.11 MIL/uL Final   Hemoglobin 03/23/2022 10.8 (L)  12.0 - 15.0 g/dL Final   HCT 03/23/2022 34.6 (L)  36.0 - 46.0 % Final   MCV 03/23/2022 77.9 (L)  80.0 - 100.0 fL Final   MCH 03/23/2022 24.3 (L)  26.0 - 34.0 pg Final   MCHC 03/23/2022 31.2  30.0 - 36.0 g/dL Final   RDW 03/23/2022  15.4  11.5 - 15.5 % Final   Platelets 03/23/2022 277  150 - 400 K/uL Final   nRBC 03/23/2022 0.0  0.0 - 0.2 % Final   Performed at [facility], [place] [address] [place], [place] [address]   Sodium  03/23/2022 136  135 - 145 mmol/L Final   Potassium 03/23/2022 3.9  3.5 - 5.1 mmol/L Final   Chloride 03/23/2022 104  98 - 111 mmol/L Final   CO2 03/23/2022 22  22 - 32 mmol/L Final   Glucose, Bld 03/23/2022 337 (H)  70 - 99 mg/dL Final   Glucose reference range applies only to samples taken after fasting for at least 8 hours.   BUN 03/23/2022 12  6 - 20 mg/dL Final   Creatinine, Ser 03/23/2022 0.70  0.44 - 1.00 mg/dL Final   Calcium 03/23/2022 9.1  8.9 - 10.3 mg/dL Final   GFR, Estimated 03/23/2022 >60  >60 mL/min Final   Comment: (NOTE) Calculated using the CKD-EPI Creatinine Equation (2021)    Anion gap 03/23/2022 10  5 - 15 Final   Performed at [facility], [place] [address], [place], [place] [address]   Color, Urine 03/23/2022 STRAW (A)  YELLOW Final   APPearance 03/23/2022 CLEAR  CLEAR Final   Specific Gravity, Urine 03/23/2022 1.022  1.005 - 1.030 Final   pH 03/23/2022 6.0  5.0 - 8.0 Final   Glucose, UA 03/23/2022 >=500 (A)  NEGATIVE mg/dL Final   Hgb urine dipstick 03/23/2022 NEGATIVE  NEGATIVE Final   Bilirubin Urine 03/23/2022 NEGATIVE  NEGATIVE Final   Ketones, ur 03/23/2022 NEGATIVE  NEGATIVE mg/dL Final   Protein, ur 03/23/2022 NEGATIVE  NEGATIVE mg/dL Final   Nitrite 03/23/2022 NEGATIVE  NEGATIVE Final   Leukocytes,Ua 03/23/2022 NEGATIVE  NEGATIVE Final   Bacteria, UA 03/23/2022 NONE SEEN  NONE SEEN Final   Squamous Epithelial / LPF 03/23/2022 0-5  0 - 5 Final   Performed at [facility], [place] [address] [place], [place] [address]  Admission on 02/17/2022, Discharged on 02/17/2022  Component Date Value Ref Range Status   Neisseria Gonorrhea 02/17/2022 Negative   Final   Chlamydia 02/17/2022 Negative   Final    Trichomonas 02/17/2022 Negative   Final   Bacterial Vaginitis (gardnerella) 02/17/2022 Positive (A)   Final   Candida Vaginitis 02/17/2022 Positive (A)   Final   Candida Glabrata 02/17/2022 Negative   Final   Comment 02/17/2022 Normal Reference Range Bacterial Vaginosis - Negative   Final   Comment 02/17/2022 Normal Reference Range Candida Species - Negative   Final   Comment 02/17/2022 Normal Reference Range Candida Galbrata - Negative   Final   Comment 02/17/2022 Normal Reference Range Trichomonas - Negative   Final   Comment 02/17/2022 Normal Reference Ranger Chlamydia - Negative   Final   Comment 02/17/2022 Normal Reference Range Neisseria Gonorrhea - Negative   Final  Admission on 02/11/2022, Discharged on 02/11/2022  Component Date Value Ref Range Status   SARS Coronavirus 2 by RT PCR 02/11/2022 POSITIVE (A)  NEGATIVE Final   Comment: (NOTE) SARS-CoV-2 target nucleic acids are DETECTED  SARS-CoV-2 RNA is generally detectable in upper respiratory specimens  during the acute phase of infection.  Positive results are indicative  of the presence of the identified virus, but do not rule out bacterial infection or co-infection with other pathogens not detected by the test.  Clinical correlation with patient history and  other diagnostic information is necessary to determine patient infection status.  The expected result is negative.  Fact Sheet for Patients:   https://www.patel.info/   Fact Sheet for Healthcare Providers:   https://hall.com/    This test is not yet approved or cleared by the [place] FDA and  has been authorized for detection  and/or diagnosis of SARS-CoV-2 by FDA under an Emergency Use Authorization (EUA).  This EUA will remain in effect (meaning this test can be used) for the duration of  the COVID-19 declaration under Section 564(b)(1)                           of the Act, 21 U.S.C. section 360-bbb-3(b)(1), unless  the authorization is terminated or revoked sooner.   Performed at [facility], [place] [address], [place], [place] [address]    Group A Strep by PCR 02/11/2022 NOT DETECTED  NOT DETECTED Final   Performed at [place] [address], [place], [place] [address]  Admission on 01/22/2022, Discharged on 01/22/2022  Component Date Value Ref Range Status   Glucose-Capillary 01/22/2022 122 (H)  70 - 99 mg/dL Final   Glucose reference range applies only to samples taken after fasting for at least 8 hours.   Sodium 01/22/2022 139  135 - 145 mmol/L Final   Potassium 01/22/2022 3.5  3.5 - 5.1 mmol/L Final   Chloride 01/22/2022 109  98 - 111 mmol/L Final   CO2 01/22/2022 23  22 - 32 mmol/L Final   Glucose, Bld 01/22/2022 98  70 - 99 mg/dL Final   Glucose reference range applies only to samples taken after fasting for at least 8 hours.   BUN 01/22/2022 14  6 - 20 mg/dL Final   Creatinine, Ser 01/22/2022 0.88  0.44 - 1.00 mg/dL Final   Calcium 01/22/2022 9.4  8.9 - 10.3 mg/dL Final   Total Protein 01/22/2022 7.5  6.5 - 8.1 g/dL Final   Albumin 01/22/2022 4.1  3.5 - 5.0 g/dL Final   AST 01/22/2022 42 (H)  15 - 41 U/L Final   ALT 01/22/2022 24  0 - 44 U/L Final   Alkaline Phosphatase 01/22/2022 53  38 - 126 U/L Final   Total Bilirubin 01/22/2022 0.4  0.3 - 1.2 mg/dL Final   GFR, Estimated 01/22/2022 >60  >60 mL/min Final   Comment: (NOTE) Calculated using the CKD-EPI Creatinine Equation (2021)    Anion gap 01/22/2022 7  5 - 15 Final   Performed at [facility], [place] [address], [place], [place] [address]   Salicylate Lvl 67/34/1939 <7.0 (L)  7.0 - 30.0 mg/dL Final   Performed at [place] [address], [place], [place] [address]   Acetaminophen (Tylenol), Serum 01/22/2022 <10 (L)  10 - 30 ug/mL Final   Comment: (NOTE) Therapeutic concentrations vary significantly. A range of 10-30 ug/mL  may be an effective  concentration for many patients. However, some  are best treated at concentrations outside of this range. Acetaminophen concentrations >150 ug/mL at 4 hours after ingestion  and >50 ug/mL at 12 hours after ingestion are often associated with  toxic reactions.  Performed at [facility], [place] [address], [place],  [address]    Alcohol, Ethyl (B) 01/22/2022 <10  <10 mg/dL Final   Comment: (NOTE) Lowest detectable limit for serum alcohol is 10 mg/dL.  For medical purposes only. Performed at  [facility], [place] [address], [place],  [address]    Opiates 01/22/2022 NONE DETECTED  NONE DETECTED Final   Cocaine 01/22/2022 NONE DETECTED  NONE DETECTED Final   Benzodiazepines 01/22/2022 NONE DETECTED  NONE DETECTED Final   Amphetamines 01/22/2022 NONE DETECTED  NONE DETECTED Final   Tetrahydrocannabinol 01/22/2022 NONE DETECTED  NONE DETECTED  Final   Barbiturates 01/22/2022 NONE DETECTED  NONE DETECTED Final   Comment: (NOTE) DRUG SCREEN FOR MEDICAL PURPOSES ONLY.  IF CONFIRMATION IS NEEDED FOR ANY PURPOSE, NOTIFY LAB WITHIN 5 DAYS.  LOWEST DETECTABLE LIMITS FOR URINE DRUG SCREEN Drug Class                     Cutoff (ng/mL) Amphetamine and metabolites    1000 Barbiturate and metabolites    200 Benzodiazepine                 937 Tricyclics and metabolites     300 Opiates and metabolites        300 Cocaine and metabolites        300 THC                            50 Performed at [facility], [place] [address] [place], [place] [address]    WBC 01/22/2022 11.6 (H)  4.0 - 10.5 K/uL Final   RBC 01/22/2022 4.51  3.87 - 5.11 MIL/uL Final   Hemoglobin 01/22/2022 11.3 (L)  12.0 - 15.0 g/dL Final   HCT 01/22/2022 36.3  36.0 - 46.0 % Final   MCV 01/22/2022 80.5  80.0 - 100.0 fL Final   MCH 01/22/2022 25.1 (L)  26.0 - 34.0 pg Final   MCHC 01/22/2022 31.1  30.0 - 36.0 g/dL Final   RDW 01/22/2022 16.3 (H)  11.5 - 15.5 %  Final   Platelets 01/22/2022 263  150 - 400 K/uL Final   nRBC 01/22/2022 0.0  0.0 - 0.2 % Final   Neutrophils Relative % 01/22/2022 56  % Final   Neutro Abs 01/22/2022 6.5  1.7 - 7.7 K/uL Final   Lymphocytes Relative 01/22/2022 33  % Final   Lymphs Abs 01/22/2022 3.9  0.7 - 4.0 K/uL Final   Monocytes Relative 01/22/2022 7  % Final   Monocytes Absolute 01/22/2022 0.8  0.1 - 1.0 K/uL Final   Eosinophils Relative 01/22/2022 3  % Final   Eosinophils Absolute 01/22/2022 0.4  0.0 - 0.5 K/uL Final   Basophils Relative 01/22/2022 1  % Final   Basophils Absolute 01/22/2022 0.1  0.0 - 0.1 K/uL Final   Immature Granulocytes 01/22/2022 0  % Final   Abs Immature Granulocytes 01/22/2022 0.05  0.00 - 0.07 K/uL Final   Performed at [facility], [place] [address], [place], [place] [address]   Lithium Lvl 01/22/2022 0.20 (L)  0.60 - 1.20 mmol/L Final   Performed at [place] [address] [place], [place] [address]   Preg Test, Ur 01/22/2022 NEGATIVE  NEGATIVE Final   Comment:        THE SENSITIVITY OF THIS METHODOLOGY IS >20 mIU/mL. Performed at [facility], [place] [address] [place],  [address]    SARS Coronavirus 2 by RT PCR 01/22/2022 NEGATIVE  NEGATIVE Final   Comment: (NOTE) SARS-CoV-2 target nucleic acids are NOT DETECTED.  The SARS-CoV-2 RNA is generally detectable in upper respiratory specimens during the acute phase of infection. The lowest concentration of SARS-CoV-2 viral copies this assay can detect is 138 copies/mL. A negative result does not preclude SARS-Cov-2 infection and should not be used as the sole basis for treatment or other patient management decisions. A negative result may occur with  improper specimen collection/handling, submission of specimen other than nasopharyngeal swab, presence of viral mutation(s) within  the areas targeted by this assay, and inadequate number of viral copies(<138 copies/mL). A  negative result must be combined with clinical observations, patient history, and epidemiological information. The expected result is Negative.  Fact Sheet for Patients:  EntrepreneurPulse.com.au  Fact Sheet for Healthcare Providers:  IncredibleEmployment.be  This test is no                          t yet approved or cleared by the [place] FDA and  has been authorized for detection and/or diagnosis of SARS-CoV-2 by FDA under an Emergency Use Authorization (EUA). This EUA will remain  in effect (meaning this test can be used) for the duration of the COVID-19 declaration under Section 564(b)(1) of the Act, 21 U.S.C.section 360bbb-3(b)(1), unless the authorization is terminated  or revoked sooner.       Influenza A by PCR 01/22/2022 NEGATIVE  NEGATIVE Final   Influenza B by PCR 01/22/2022 NEGATIVE  NEGATIVE Final   Comment: (NOTE) The Xpert Xpress SARS-CoV-2/FLU/RSV plus assay is intended as an aid in the diagnosis of influenza from Nasopharyngeal swab specimens and should not be used as a sole basis for treatment. Nasal washings and aspirates are unacceptable for Xpert Xpress SARS-CoV-2/FLU/RSV testing.  Fact Sheet for Patients: EntrepreneurPulse.com.au  Fact Sheet for Healthcare Providers: IncredibleEmployment.be  This test is not yet approved or cleared by the [place] FDA and has been authorized for detection and/or diagnosis of SARS-CoV-2 by FDA under an Emergency Use Authorization (EUA). This EUA will remain in effect (meaning this test can be used) for the duration of the COVID-19 declaration under Section 564(b)(1) of the Act, 21 U.S.C. section 360bbb-3(b)(1), unless the authorization is terminated or revoked.  Performed at [facility], [place] [address] [place], [place] [address]   Admission on 01/10/2022, Discharged on 01/11/2022  Component Date Value Ref Range  Status   SARS Coronavirus 2 by RT PCR 01/10/2022 NEGATIVE  NEGATIVE Final   Comment: (NOTE) SARS-CoV-2 target nucleic acids are NOT DETECTED.  The SARS-CoV-2 RNA is generally detectable in upper respiratory specimens during the acute phase of infection. The lowest concentration of SARS-CoV-2 viral copies this assay can detect is 138 copies/mL. A negative result does not preclude SARS-Cov-2 infection and should not be used as the sole basis for treatment or other patient management decisions. A negative result may occur with  improper specimen collection/handling, submission of specimen other than nasopharyngeal swab, presence of viral mutation(s) within the areas targeted by this assay, and inadequate number of viral copies(<138 copies/mL). A negative result must be combined with clinical observations, patient history, and epidemiological information. The expected result is Negative.  Fact Sheet for Patients:  EntrepreneurPulse.com.au  Fact Sheet for Healthcare Providers:  IncredibleEmployment.be  This test is no                          t yet approved or cleared by the [place] FDA and  has been authorized for detection and/or diagnosis of SARS-CoV-2 by FDA under an Emergency Use Authorization (EUA). This EUA will remain  in effect (meaning this test can be used) for the duration of the COVID-19 declaration under Section 564(b)(1) of the Act, 21 U.S.C.section 360bbb-3(b)(1), unless the authorization is terminated  or revoked sooner.       Influenza A by PCR 01/10/2022 NEGATIVE  NEGATIVE Final   Influenza B by PCR 01/10/2022 NEGATIVE  NEGATIVE Final  Comment: (NOTE) The Xpert Xpress SARS-CoV-2/FLU/RSV plus assay is intended as an aid in the diagnosis of influenza from Nasopharyngeal swab specimens and should not be used as a sole basis for treatment. Nasal washings and aspirates are unacceptable for Xpert Xpress  SARS-CoV-2/FLU/RSV testing.  Fact Sheet for Patients: EntrepreneurPulse.com.au  Fact Sheet for Healthcare Providers: IncredibleEmployment.be  This test is not yet approved or cleared by the [place] FDA and has been authorized for detection and/or diagnosis of SARS-CoV-2 by FDA under an Emergency Use Authorization (EUA). This EUA will remain in effect (meaning this test can be used) for the duration of the COVID-19 declaration under Section 564(b)(1) of the Act, 21 U.S.C. section 360bbb-3(b)(1), unless the authorization is terminated or revoked.  Performed at [facility], [place] [address] [place], [place] [address]    WBC 01/10/2022 11.0 (H)  4.0 - 10.5 K/uL Final   RBC 01/10/2022 4.43  3.87 - 5.11 MIL/uL Final   Hemoglobin 01/10/2022 10.8 (L)  12.0 - 15.0 g/dL Final   HCT 01/10/2022 35.0 (L)  36.0 - 46.0 % Final   MCV 01/10/2022 79.0 (L)  80.0 - 100.0 fL Final   MCH 01/10/2022 24.4 (L)  26.0 - 34.0 pg Final   MCHC 01/10/2022 30.9  30.0 - 36.0 g/dL Final   RDW 01/10/2022 16.4 (H)  11.5 - 15.5 % Final   Platelets 01/10/2022 280  150 - 400 K/uL Final   REPEATED TO VERIFY   nRBC 01/10/2022 0.0  0.0 - 0.2 % Final   Neutrophils Relative % 01/10/2022 54  % Final   Neutro Abs 01/10/2022 6.0  1.7 - 7.7 K/uL Final   Lymphocytes Relative 01/10/2022 35  % Final   Lymphs Abs 01/10/2022 3.8  0.7 - 4.0 K/uL Final   Monocytes Relative 01/10/2022 6  % Final   Monocytes Absolute 01/10/2022 0.6  0.1 - 1.0 K/uL Final   Eosinophils Relative 01/10/2022 4  % Final   Eosinophils Absolute 01/10/2022 0.4  0.0 - 0.5 K/uL Final   Basophils Relative 01/10/2022 1  % Final   Basophils Absolute 01/10/2022 0.1  0.0 - 0.1 K/uL Final   Immature Granulocytes 01/10/2022 0  % Final   Abs Immature Granulocytes 01/10/2022 0.04  0.00 - 0.07 K/uL Final   Performed at [facility], [place] [address] [place], [place] [address]   Sodium 01/10/2022 140  135 - 145  mmol/L Final   Potassium 01/10/2022 3.6  3.5 - 5.1 mmol/L Final   Chloride 01/10/2022 104  98 - 111 mmol/L Final   CO2 01/10/2022 25  22 - 32 mmol/L Final   Glucose, Bld 01/10/2022 267 (H)  70 - 99 mg/dL Final   Glucose reference range applies only to samples taken after fasting for at least 8 hours.   BUN 01/10/2022 8  6 - 20 mg/dL Final   Creatinine, Ser 01/10/2022 0.79  0.44 - 1.00 mg/dL Final   Calcium 01/10/2022 9.8  8.9 - 10.3 mg/dL Final   Total Protein 01/10/2022 7.0  6.5 - 8.1 g/dL Final   Albumin 01/10/2022 3.9  3.5 - 5.0 g/dL Final   AST 01/10/2022 26  15 - 41 U/L Final   ALT 01/10/2022 21  0 - 44 U/L Final   Alkaline Phosphatase 01/10/2022 52  38 - 126 U/L Final   Total Bilirubin 01/10/2022 0.6  0.3 - 1.2 mg/dL Final   GFR, Estimated 01/10/2022 >60  >60 mL/min Final   Comment: (NOTE) Calculated using the  CKD-EPI Creatinine Equation (2021)    Anion gap 01/10/2022 11  5 - 15 Final   Performed at [facility], [place] [address], [place], [place] [address]   Hgb A1c MFr Bld 01/10/2022 7.6 (H)  4.8 - 5.6 % Final   Comment: (NOTE) Pre diabetes:          5.7%-6.4%  Diabetes:              >6.4%  Glycemic control for   <7.0% adults with diabetes    Mean Plasma Glucose 01/10/2022 171.42  mg/dL Final   Performed at [place] [address] [place], [place] [address]   Magnesium 01/10/2022 2.0  1.7 - 2.4 mg/dL Final   Performed at [place] [address] [place], [place] [address]   Alcohol, Ethyl (B) 01/10/2022 <10  <10 mg/dL Final   Comment: (NOTE) Lowest detectable limit for serum alcohol is 10 mg/dL.  For medical purposes only. Performed at [facility], [place] [address], [place], [place] [address]    TSH 01/10/2022 0.983  0.350 - 4.500 uIU/mL Final   Comment: Performed by a 3rd Generation assay with a functional sensitivity of <=0.01 uIU/mL. Performed at [facility], [place] [address], [place], [place] [address]    Lithium Lvl  01/10/2022 0.18 (L)  0.60 - 1.20 mmol/L Final   Performed at [place] [address], [place],  [address]   SARS Coronavirus 2 Ag 01/10/2022 Negative  Negative Preliminary   SARSCOV2ONAVIRUS 2 AG 01/10/2022 NEGATIVE  NEGATIVE Final   Comment: (NOTE) SARS-CoV-2 antigen NOT DETECTED.   Negative results are presumptive.  Negative results do not preclude SARS-CoV-2 infection and should not be used as the sole basis for treatment or other patient management decisions, including infection  control decisions, particularly in the presence of clinical signs and  symptoms consistent with COVID-19, or in those who have been in contact with the virus.  Negative results must be combined with clinical observations, patient history, and epidemiological information. The expected result is Negative.  Fact Sheet for Patients: HandmadeRecipes.com.cy  Fact Sheet for Healthcare Providers: FuneralLife.at  This test is not yet approved or cleared by the [place] FDA and  has been authorized for detection and/or diagnosis of SARS-CoV-2 by FDA under an Emergency Use Authorization (EUA).  This EUA will remain in effect (meaning this test can be used) for the duration of  the COV                          ID-19 declaration under Section 564(b)(1) of the Act, 21 U.S.C. section 360bbb-3(b)(1), unless the authorization is terminated or revoked sooner.     Glucose-Capillary 01/10/2022 279 (H)  70 - 99 mg/dL Final   Glucose reference range applies only to samples taken after fasting for at least 8 hours.  There may be more visits with results that are not included.    Blood Alcohol level:  Lab Results  Component Value Date   [facility] <10 04/06/2022   ETH <10 26/83/4196    Metabolic Disorder Labs: Lab Results  Component Value Date   HGBA1C 7.6 (H) 01/10/2022   MPG 171.42 01/10/2022   MPG 116.89 09/28/2021   Lab Results  Component Value  Date   PROLACTIN 32.1 (H) 05/03/2020   PROLACTIN 5.3 05/19/2019   Lab Results  Component Value Date   CHOL 159 09/28/2021  TRIG 91 09/28/2021   HDL 51 09/28/2021   CHOLHDL 3.1 09/28/2021   VLDL 18 09/28/2021   LDLCALC 90 09/28/2021   LDLCALC 109 (H) 03/22/2021    Therapeutic Lab Levels: Lab Results  Component Value Date   LITHIUM 0.20 (L) 01/22/2022   LITHIUM 0.18 (L) 01/10/2022   Lab Results  Component Value Date   VALPROATE <20 (L) 10/13/2021   VALPROATE <10 (L) 03/23/2021   No results found for: "CBMZ"  Physical Findings   AIMS    Flowsheet Row Admission (Discharged) from OP Visit from 09/12/2019 in [place] Admission (Discharged) from OP Visit from 07/29/2019 in [place] Admission (Discharged) from OP Visit from 07/13/2019 in [place] Admission (Discharged) from 06/23/2019 in [place] Admission (Discharged) from OP Visit from 06/11/2019 in [place] Total Score 0 0 0 0 0      AUDIT    Flowsheet Row Admission (Discharged) from OP Visit from 09/12/2019 in [place] Admission (Discharged) from OP Visit from 07/29/2019 in [place] Admission (Discharged) from OP Visit from 07/13/2019 in [place] Admission (Discharged) from OP Visit from 06/11/2019 in [place] Admission (Discharged) from OP Visit from 05/29/2019 in [place] [facility]  Alcohol Use Disorder Identification Test Final Score (AUDIT) 0 0 _0 GAD-7    Flowsheet Row Clinical Support from 07/05/2020 in [facility] [place] [facility] Office Visit from 05/19/2018 in [place] for [facility] Procedure visit from 03/18/2018 in [place] for [facility] Office Visit from 01/12/2018 in [place] for [facility] Procedure visit from 01/13/2017 in [place] for [facility]  Total GAD-7 Score _1 PHQ2-9    [place] ED from 04/09/2022 in [facility] ED from 01/10/2022 in [facility] ED from 10/24/2021 in [place] from 07/05/2020 in [facility] [place] [facility] ED from 05/26/2020 in [place] DEPT  PHQ-2 Total Score _2 PHQ-9 Total Score _3 SBQ-R    Flowsheet Row Counselor from 10/17/2019 in [facility]  SBQ-R Total Score [place] ED from 04/09/2022 in [facility] ED from 04/06/2022 in [place] DEPT ED from 03/26/2022 in [facility] High Risk No Risk Error: Question 6 not populated        Musculoskeletal  Strength & Muscle Tone: within normal limits Gait & Station: normal Patient leans: N/A  Psychiatric Specialty Exam  Presentation  General Appearance:  Appropriate for Environment  Eye Contact: Fair  Speech: Clear and Coherent  Speech Volume: Normal  Handedness: Right   Mood and Affect  Mood: Depressed  Affect: Congruent   Thought Process  Thought Processes: Coherent  Descriptions of Associations:Intact  Orientation:Full (Time, Place and Person)  Thought Content:Logical  Diagnosis of Schizophrenia or Schizoaffective disorder in past: Yes  Duration of Psychotic Symptoms: Greater than six months   Hallucinations:Hallucinations: None  Ideas of Reference:None  Suicidal Thoughts:Suicidal Thoughts: Yes, Active SI Active Intent and/or Plan: With Plan  Homicidal Thoughts:Homicidal  Thoughts: No   Sensorium  Memory: Immediate Fair; Recent Fair; Remote  Fair  Judgment: Poor  Insight: Lacking   Executive Functions  Concentration: Fair  Attention Span: Fair  Recall: [employer] of Knowledge: Fair  Language: Fair   Psychomotor Activity  Psychomotor Activity: Psychomotor Activity: Decreased   Assets  Assets: Communication Skills; Desire for Improvement; Housing; Catering manager; Social Support   Sleep  Sleep: Sleep: Fair   Nutritional Assessment (For OBS and FBC admissions only) Has the patient had a weight loss or gain of 10 pounds or more in the last 3 months?: No Has the patient had a decrease in food intake/or appetite?: No Does the patient have dental problems?: No Does the patient have eating habits or behaviors that may be indicators of an eating disorder including binging or inducing vomiting?: No Has the patient recently lost weight without trying?: 0 Has the patient been eating poorly because of a decreased appetite?: 0 Malnutrition Screening Tool Score: 0    Physical Exam  Physical Exam HENT:     Head: Normocephalic.     Nose: Nose normal.  Cardiovascular:     Rate and Rhythm: Normal rate.  Pulmonary:     Effort: Pulmonary effort is normal.  Musculoskeletal:        General: Normal range of motion.     Cervical back: Normal range of motion.  Neurological:     Mental Status: She is alert and oriented to person, place, and time.    Review of Systems  Constitutional: Negative.   HENT: Negative.    Eyes: Negative.   Respiratory: Negative.    Cardiovascular: Negative.   Gastrointestinal: Negative.   Genitourinary: Negative.   Musculoskeletal: Negative.   Neurological: Negative.   Endo/Heme/Allergies: Negative.    Blood pressure 113/69, pulse 100, temperature 98.2 F (36.8 C), temperature source Oral, resp. rate 18, last menstrual period 03/05/2022, SpO2 100 %. There is no height or weight on file to calculate BMI.  Treatment Plan Summary: Patient is voluntary. Patient  is recommended for inpatient psychiatric treatment. EMTALA completed. Admission orders reordered.   Pt was accepted to [facility] 04[facility]10/20[facility]. Bed assignment: 830-9  Pt meets inpatient criteria per Darrol Angel, NP  Attending Physician will be Janine Limbo, MD   Report can be called to: -Adult unit: [phone]  Current medication orders initiated on arrival.   benztropine  0.5 mg Oral BID   haloperidol  10 mg Oral QHS   insulin aspart  0-5 Units Subcutaneous QHS   insulin aspart  0-9 Units Subcutaneous TID WC   metoprolol tartrate  12.5 mg Oral BID    Sadat Sliwa L, NP 04/10/2022 10:42 AM

## 2022-04-10 NOTE — ED Notes (Signed)
Pt sleeping at present, no distress noted.  Monitoring for safety. 

## 2022-04-10 NOTE — Progress Notes (Addendum)
Pt admitted to [facility] from [facility] where she presented initially with GPD for worsening depression, SI without plan to overdose on her home medication or cut herself. Pt observed with fair eye contact, tangential, pressured speech, ambulatory with steady gait. She denies HI, AVH and pain when assessed "The voices are better. I just feel really sad and it's been getting worse". Continues to endorse SI with plan to overdose on her home medications but verbally contracts for safety. Per pt, recent stressor include "My friend left the house, he still hasn't come back yet. He's on drugs, I don't want anything to happen to him". 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 Pt was also requesting for a group home on initial contact "I will be alright in a group home". Skin assessment done without areas of breakdown. Belongings searched, items deemed contraband secured in locker. Pt ambulatory to unit with a steady gait. Unit orientation done, routines discussed, care plan reviewed with pt and all admission documents signed. Safety checks initiated at Q 15 minutes intervals without self harm gestures / outburst. Pt tolerated lunch and fluids well. Safety maintained in milieu.

## 2022-04-11 LAB — GLUCOSE, CAPILLARY
Glucose-Capillary: 219 mg/dL — ABNORMAL HIGH (ref 70–99)
Glucose-Capillary: 271 mg/dL — ABNORMAL HIGH (ref 70–99)
Glucose-Capillary: 205 mg/dL — ABNORMAL HIGH (ref 70–99)
Glucose-Capillary: 151 mg/dL — ABNORMAL HIGH (ref 70–99)

## 2022-04-11 LAB — LITHIUM LEVEL: Lithium Lvl: <0.06 mmol/L — ABNORMAL LOW (ref 0.60–1.20)

## 2022-04-11 MED ORDER — AMLODIPINE BESYLATE 10 MG PO TABS
10.0000 mg | ORAL_TABLET | Freq: Every day | ORAL | Status: DC
  Administered 2022-04-11 – 2022-04-14 (×4): 10 mg via ORAL
  Filled 2022-04-11 (×7): qty 1

## 2022-04-11 MED ORDER — VENLAFAXINE HCL 75 MG PO TABS: 150.0000 mg | ORAL_TABLET | Freq: Every day | ORAL | Status: DC

## 2022-04-11 MED ORDER — WHITE PETROLATUM EX OINT
TOPICAL_OINTMENT | CUTANEOUS | Status: AC
  Filled 2022-04-11: qty 5

## 2022-04-11 MED ORDER — PANTOPRAZOLE SODIUM 40 MG PO TBEC
40.0000 mg | DELAYED_RELEASE_TABLET | Freq: Every day | ORAL | Status: DC
  Administered 2022-04-11 – 2022-04-14 (×4): 40 mg via ORAL
  Filled 2022-04-11 (×7): qty 1

## 2022-04-11 MED ORDER — SIMVASTATIN 40 MG PO TABS
40.0000 mg | ORAL_TABLET | Freq: Every day | ORAL | Status: DC
  Administered 2022-04-11 – 2022-04-13 (×3): 40 mg via ORAL
  Filled 2022-04-11 (×5): qty 1

## 2022-04-11 MED ORDER — VITAMIN D 25 MCG (1000 UNIT) PO TABS
1000.0000 [IU] | ORAL_TABLET | Freq: Every day | ORAL | Status: DC
  Administered 2022-04-12 – 2022-04-14 (×3): 1000 [IU] via ORAL
  Filled 2022-04-11 (×5): qty 1

## 2022-04-11 MED ORDER — LITHIUM CARBONATE ER 450 MG PO TBCR
450.0000 mg | EXTENDED_RELEASE_TABLET | Freq: Every day | ORAL | Status: DC
  Administered 2022-04-11 – 2022-04-13 (×3): 450 mg via ORAL
  Filled 2022-04-11 (×5): qty 1

## 2022-04-11 MED ORDER — GABAPENTIN 100 MG PO CAPS
100.0000 mg | ORAL_CAPSULE | Freq: Three times a day (TID) | ORAL | Status: DC
  Administered 2022-04-11 – 2022-04-14 (×9): 100 mg via ORAL
  Filled 2022-04-11 (×14): qty 1

## 2022-04-11 MED ORDER — VENLAFAXINE HCL ER 75 MG PO CP24
75.0000 mg | ORAL_CAPSULE | Freq: Every day | ORAL | Status: DC
  Administered 2022-04-11 – 2022-04-14 (×4): 75 mg via ORAL
  Filled 2022-04-11 (×7): qty 1

## 2022-04-11 MED ORDER — INSULIN ASPART PROT & ASPART (70-30 MIX) 100 UNIT/ML ~~LOC~~ SUSP
20.0000 [IU] | Freq: Two times a day (BID) | SUBCUTANEOUS | Status: DC
  Administered 2022-04-11 – 2022-04-14 (×6): 20 [IU] via SUBCUTANEOUS

## 2022-04-11 MED ORDER — GLIPIZIDE ER 10 MG PO TB24
10.0000 mg | ORAL_TABLET | Freq: Every day | ORAL | Status: DC
  Administered 2022-04-12 – 2022-04-14 (×3): 10 mg via ORAL
  Filled 2022-04-11 (×5): qty 1

## 2022-04-11 NOTE — [facility] Group Notes (Signed)
Goals Group 04/11/22   Group Focus: affirmation, clarity of thought, and goals/reality orientation Treatment Modality:  Psychoeducation Interventions utilized were assignment, group exercise, and support Purpose: To be able to understand and verbalize the reason for their admission to the hospital. To understand that the medication helps with their chemical imbalance but they also need to work on their choices in life. To be challenged to develop a list of 30 positives about themselves. Also introduce the concept that "feelings" are not reality.  Participation Level:  did not attend  [name]

## 2022-04-11 NOTE — [facility] Suicide Risk Assessment (Signed)
Suicide Risk Assessment  Admission Assessment    [facility] Admission Suicide Risk Assessment   Nursing information obtained from:  Patient  Demographic factors:  Adolescent or young adult, Unemployed, Living alone  Current Mental Status:  Suicidal ideation indicated by patient  Loss Factors:  Decrease in vocational status  Historical Factors:  Prior suicide attempts  Risk Reduction Factors:  Positive social support  Total Time spent with patient: 1 hour  Principal Problem: Schizophrenia ([place])  Diagnosis:  Principal Problem:   Schizophrenia ([place])  Subjective Data: See H&P.  Continued Clinical Symptoms:  Alcohol Use Disorder Identification Test Final Score (AUDIT): 0 The "Alcohol Use Disorders Identification Test", Guidelines for Use in Primary Care, Second Edition.  World Pharmacologist [facility]). Score between 0-7:  no or low risk or alcohol related problems. Score between 8-15:  moderate risk of alcohol related problems. Score between 16-19:  high risk of alcohol related problems. Score 20 or above:  warrants further diagnostic evaluation for alcohol dependence and treatment.  CLINICAL FACTORS:   Depression:   Impulsivity Schizophrenia:   Paranoid or undifferentiated type More than one psychiatric diagnosis Previous Psychiatric Diagnoses and Treatments Medical Diagnoses and Treatments/Surgeries  Musculoskeletal: Strength & Muscle Tone: within normal limits Gait & Station: normal Patient leans: N/A  Psychiatric Specialty Exam:  Presentation  General Appearance:  Casual; Fairly Groomed  Eye Contact: Fair  Speech: Clear and Coherent; Normal Rate  Speech Volume: Normal  Handedness: Right   Mood and Affect  Mood: Depressed; Anxious  Affect: Congruent   Thought Process  Thought Processes: Coherent; Goal Directed  Descriptions of Associations:Intact  Orientation:Full (Time, Place and Person)  Thought Content:Logical  History of  Schizophrenia/Schizoaffective disorder:Yes  Duration of Psychotic Symptoms:Greater than six months  Hallucinations:Hallucinations: None  Ideas of Reference:None  Suicidal Thoughts:Suicidal Thoughts: Yes, Passive SI Active Intent and/or Plan: Without Intent; Without Means to Carry Out; Without Plan; Without Access to Means SI Passive Intent and/or Plan: Without Intent; Without Means to Carry Out; Without Plan; Without Access to Means  Homicidal Thoughts:Homicidal Thoughts: No   Sensorium  Memory: Immediate Fair; Recent Fair; Remote Fair  Judgment: Fair  Insight: Fair   Community education officer  Concentration: Fair  Attention Span: Fair  Recall: [employer] of Knowledge: Fair  Language: Good   Psychomotor Activity  Psychomotor Activity: Psychomotor Activity: Normal AIMS Completed?: No   Assets  Assets: Communication Skills; Desire for Improvement; Financial Resources/Insurance; Housing; Physical Health; Resilience; Social Support   Sleep  Sleep: Sleep: Good Number of Hours of Sleep: 7    Physical Exam: Physical Exam Vitals and nursing note reviewed.  Constitutional:      Comments: Patient is obese.  HENT:     Nose: Nose normal.     Mouth/Throat:     Pharynx: Oropharynx is clear.  Cardiovascular:     Rate and Rhythm: Normal rate.     Pulses: Normal pulses.  Pulmonary:     Effort: Pulmonary effort is normal.  Genitourinary:    Comments: Deferred Musculoskeletal:        General: Normal range of motion.     Cervical back: Normal range of motion.  Skin:    General: Skin is warm.  Neurological:     General: No focal deficit present.     Mental Status: She is alert and oriented to person, place, and time.    Review of Systems  Constitutional:  Negative for chills, diaphoresis and fever.  HENT:  Negative for nosebleeds and sore throat.  Respiratory:  Negative for cough, shortness of breath and wheezing.   Cardiovascular:  Negative for chest  pain and palpitations.  Gastrointestinal:  Negative for abdominal pain, constipation, diarrhea, heartburn, nausea and vomiting.  Genitourinary:  Negative for dysuria.  Musculoskeletal:  Negative for joint pain and myalgias.  Skin:  Negative for itching and rash.  Neurological:  Negative for dizziness, tingling, tremors, sensory change, speech change, focal weakness, seizures, loss of consciousness, weakness and headaches.  Endo/Heme/Allergies:        Allergies:  Wellbutrin  Omnipaque  Penicillins  Atarax Contrast Media (iodinated)  Cymbalta [duloxetine Hcl].        Psychiatric/Behavioral:  Positive for depression. Negative for hallucinations, memory loss, substance abuse and suicidal ideas (Hx of). The patient is not nervous/anxious and does not have insomnia.    Blood pressure 134/73, pulse 94, temperature 98.4 F (36.9 C), temperature source Oral, resp. rate 20, height '5\' 7"'$  (1.702 m), weight 51.7 kg, last menstrual period 03/05/2022, SpO2 100 %. Body mass index is 17.85 kg/m.  COGNITIVE FEATURES THAT CONTRIBUTE TO RISK:  Closed-mindedness, Loss of executive function, Polarized thinking, and Thought constriction (tunnel vision)    SUICIDE RISK:   Mild:  Suicidal ideation of limited frequency, intensity, duration, and specificity.  There are no identifiable plans, no associated intent, mild dysphoria and related symptoms, good self-control (both objective and subjective assessment), few other risk factors, and identifiable protective factors, including available and accessible social support.  PLAN OF CARE: See H&P.  I certify that inpatient services furnished can reasonably be expected to improve the patient's condition.   Lindell Spar, NP, pmhnp, fnp-bc. 04/11/2022, 12:04 PM
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
## 2022-04-11 NOTE — Progress Notes (Signed)
   04/11/22 0530  Sleep  Number of Hours 7

## 2022-04-11 NOTE — Group Note (Signed)
LCSW Group Therapy Note  04/11/2022      Type of Therapy and Topic:  Group Therapy: Gratitude   Description:   Group could not be held by Education officer, museum, but Airline pilot did provide group.  A handout was given to each patient, with the following information:   Gratitude  "Acknowledging the good that you already have in your life is the foundation for all abundance." - Betha Loa  " 'Enough' is a feast." - Buddhist Proverb  "Gratitude sweetens even the smallest moments."  "It is not joy that makes [place] grateful; It is gratitude that makes [place] joyful." - Elroy Channel    Put at least one response under each category of something for which you are grateful:  People:  Experiences:  Things:  Places:  Skills:  Other:  Add more responses as you get ideas from other people.   Therapeutic Modalities:   Activity  Maretta Los, LCSW

## 2022-04-11 NOTE — Progress Notes (Signed)
Pt is A&OX4, hypomanic while awake (dancing & singing in hallway, pressured & loud speech, talkative), denies suicidal ideations, denies homicidal ideations, denies auditory hallucinations and denies visual hallucinations. Pt verbally agrees to approach staff if these become apparent and before harming self or others. Pt denies experiencing nightmares. Mood and affect are congruent. Pt appetite is good. No complaints of anxiety, distress, pain and/or discomfort at this time. Pt's memory appears to be grossly intact, and Pt hasn't displayed any injurious behaviors. Pt is medication compliant. There's no evidence of suicidal intent. Psychomotor activity was WNL. No s/s of Parkinson, Dystonia, Akathisia and/or Tardive Dyskinesia noted.

## 2022-04-11 NOTE — H&P (Signed)
Psychiatric Admission Assessment Adult  Patient Identification: [name]  MRN:  [id number]  Date of Evaluation:  04/11/2022  Chief Complaint: Worsening symptoms of depression/suicidal ideations with plan to overdose on medications.  Principal Diagnosis: Schizoaffective disorder, bipolar type ([place])  Diagnosis:  Principal Problem:   Schizoaffective disorder, bipolar type ([place]) Active Problems:   Severe episode of recurrent major depressive disorder, without psychotic features ([place])   Schizophrenia ([place])  History of Present Illness: This is one of several psychiatric admission evaluation in this [facility] for this 31 year old [name] with hx of Schizophrenia, chronic, Schizoaffective disorder, bipolar-type, chronic & major depressive disorder, recurrent episodes. Patient is well know in this hospital & other [facility] systems from her previous admissions for mood stabilization treatments. Prior to this admission, she was receiving mental health care on an outpatient basis. She is linked with the [facility] with Dr. Alonza Smoker as the medical director. Patient was brought to [facility] this time around from the [facility] with complain of worsening symptoms of depression & suicidal ideations with plans to cut herself or overdose on medications. She was in need of further psychiatric evaluation/treatments. During this evaluation, [name] reports,   "The police brought me to the [facility] two days ago. I was there for just a day before coming up here. I was having problems with my family & my friend. I'm having argument & stuff like that with them. My mama comes to my place, walks around while banging the phone. My friend keeping going in & out of jail while using drugs. That got me upset & depressed. I don't think that my medicines are working any more. I take Buspar, Lithium, haldol & a new medicine I started 3-4 months ago. I take these medicines for bipolar disorder, Schizoaffective disorder &  Schizophrenia. I have been feeling depressed for 3 weeks, then I became suicidal. I was going to cut my wrists or overdose on my pills. I'm feeling very sleepy & depressed right now. I keep thinking about the same thing that got stuck in my mind. My mama would not answer the phone for me today, but she did last night. My depression right now is #7 & anxiety #7. I'm not feeling suicidal right at this moment. I sleep too much at home. I'm not hearing any voices or seeing stuff right now".  Objective: [name] is seen in her room. Chart reviewed. The chart findings dicussed with the treatment team. She presents alert, oriented & aware of situation. She is making a good eye contact & verbally responsive. She presents as a good historian. Patient says she last met with her [facility] team last Friday. A review of her current lab results showed her lithium level at 0.06. This means, patient may not been adherent to her treatment regimen. At this time, patient does not present depressed, anxious, restless & does not appear to be in any apparent distress. Discussed this case with the attending psychiatrist. We will resume her mental health medications including her other pertinent home medications for her other pre-existing medical issues. We will obtain lithium levels on 04-14-22 am.   Associated Signs/Symptoms:  Depression Symptoms:  depressed mood, feelings of worthlessness/guilt, hopelessness, suicidal thoughts with specific plan,  (Hypo) Manic Symptoms:  Irritable Mood, Labiality of Mood,  Anxiety Symptoms:  Excessive Worry,  Psychotic Symptoms:   Patient currently denies any AVH, delusional thoughts or paranoia. She does not appear to be responding to any internal stimuli.  PTSD Symptoms: NA  Total Time spent with patient: 1 hour  Past Psychiatric History: Schizophrenia.  Is the patient at risk to self? No.  Has the patient been a risk to self in the past 6 months? Yes.    Has the patient been a  risk to self within the distant past? Yes.    Is the patient a risk to others? No.  Has the patient been a risk to others in the past 6 months? No.  Has the patient been a risk to others within the distant past? No.   [place] Scale:  [name] Admission (Current) from 04/10/2022 in [facility] ED from 04/09/2022 in [facility] ED from 04/06/2022 in [facility]  C-SSRS RISK CATEGORY Moderate Risk High Risk No Risk       Prior Inpatient Therapy: Yes.   If yes, describ: Patient have had multiple psychiatric admissions here at [facility].    Prior Outpatient Therapy: Yes.   If yes, describe: Patient is linked to the [facility].   Alcohol Screening: 1. How often do you have a drink containing alcohol?: Never 2. How many drinks containing alcohol do you have on a typical day when you are drinking?: 1 or 2 3. How often do you have six or more drinks on one occasion?: Never AUDIT-C Score: 0 4. How often during the last year have you found that you were not able to stop drinking once you had started?: Never 5. How often during the last year have you failed to do what was normally expected from you because of drinking?: Never 6. How often during the last year have you needed a first drink in the morning to get yourself going after a heavy drinking session?: Never 7. How often during the last year have you had a feeling of guilt of remorse after drinking?: Never 8. How often during the last year have you been unable to remember what happened the night before because you had been drinking?: Never 9. Have you or someone else been injured as a result of your drinking?: No 10. Has a relative or friend or a doctor or another health worker been concerned about your drinking or suggested you cut down?: No Alcohol Use Disorder Identification Test Final Score (AUDIT): 0 Alcohol Brief Interventions/Follow-up:  Alcohol education/Brief advice  Substance Abuse History in the last 12 months:  No.  Consequences of Substance Abuse: Negative  Previous Psychotropic Medications:  Haldol, Effexor XR  Psychological Evaluations: No   Past Medical History:  Past Medical History:  Diagnosis Date   Anxiety    Bipolar 1 disorder ([place])    Cognitive deficits    Depression    Diabetes mellitus without complication ([place])    Hypertension    Mental disorder    Mental health disorder    Obesity     Past Surgical History:  Procedure Laterality Date   CESAREAN SECTION     CESAREAN SECTION N/A 04/25/2013   Procedure: REPEAT CESAREAN SECTION;  Surgeon: [name], MD;  Location: [facility];  Service: Obstetrics;  Laterality: N/A;   MASS EXCISION N/A 06/03/2012   Procedure: EXCISION MASS;  Surgeon: [name], MD;  Location: [facility];  Service: ENT;  Laterality: N/A;  Excision uvula mass   TONSILLECTOMY N/A 06/03/2012   Procedure: TONSILLECTOMY;  Surgeon: [name], MD;  Location: [facility];  Service: ENT;  Laterality: N/A;   TONSILLECTOMY  Family History:  Family History  Problem Relation Age of Onset   Hypertension Mother    Diabetes Father    Family Psychiatric  History: Patient denies any familial hx of mental illnesses.  Tobacco Screening: "I smoke 3-4 packs of cigarettes daily. Social History   Tobacco Use  Smoking Status Every Day   Types: Cigars  Smokeless Tobacco Never  Tobacco Comments   Pt declined    [place] [place] Tobacco Counseling     Are you interested in Tobacco Cessation Medications?  No, patient refused Counseled patient on smoking cessation:  Refused/Declined practical counseling Reason Tobacco Screening Not Completed: Patient Refused Screening       Social History:  Social History   Substance and Sexual Activity  Alcohol Use Not Currently     Social History   Substance and Sexual Activity  Drug Use Not Currently   Types:  "Crack" cocaine, Other-see comments   Comment: Patient reports hx of smoking Crack    Additional Social History:  Allergies:   Allergies  Allergen Reactions   Wellbutrin [Bupropion] Shortness Of Breath   Omnipaque [Iohexol] Swelling and Other (See Comments)    Eye swelling   Penicillins Hives and Other (See Comments)    Has patient had a PCN reaction causing immediate rash, facial/tongue/throat swelling, SOB or lightheadedness with hypotension: Unknown Has patient had a PCN reaction causing severe rash involving mucus membranes or skin necrosis: Yes Has patient had a PCN reaction that required hospitalization Unknown Has patient had a PCN reaction occurring within the last 10 years: No If all of the above answers are "NO", then may proceed with Cephalosporin use.   Atarax [Hydroxyzine] Other (See Comments)    Causes hyperactivity, makes pt want to "fight"   Contrast Media [Iodinated Contrast Media] Other (See Comments)    Eyes swell up   Cymbalta [Duloxetine Hcl] Other (See Comments)    No appetite and makes the patient "act up"   Lab Results:  Results for orders placed or performed during the hospital encounter of 04/10/22 (from the past 48 hour(s))  Glucose, capillary     Status: Abnormal   Collection Time: 04/10/22  4:43 PM  Result Value Ref Range   Glucose-Capillary 205 (H) 70 - 99 mg/dL    Comment: Glucose reference range applies only to samples taken after fasting for at least 8 hours.  Glucose, capillary     Status: Abnormal   Collection Time: 04/10/22  8:06 PM  Result Value Ref Range   Glucose-Capillary 200 (H) 70 - 99 mg/dL    Comment: Glucose reference range applies only to samples taken after fasting for at least 8 hours.  Glucose, capillary     Status: Abnormal   Collection Time: 04/11/22  6:03 AM  Result Value Ref Range   Glucose-Capillary 219 (H) 70 - 99 mg/dL    Comment: Glucose reference range applies only to samples taken after fasting for at least 8 hours.   Lithium level     Status: Abnormal   Collection Time: 04/11/22  6:38 AM  Result Value Ref Range   Lithium Lvl <0.06 (L) 0.60 - 1.20 mmol/L    Comment: Performed at [facility] [address] [place], [place] [address]  Glucose, capillary     Status: Abnormal   Collection Time: 04/11/22 12:06 PM  Result Value Ref Range   Glucose-Capillary 271 (H) 70 - 99 mg/dL    Comment: Glucose reference range applies only to samples taken after fasting for  at least 8 hours.   Blood Alcohol level:  Lab Results  Component Value Date   ETH <10 04/06/2022   ETH <10 68/06/2120   Metabolic Disorder Labs:  Lab Results  Component Value Date   HGBA1C 7.6 (H) 01/10/2022   MPG 171.42 01/10/2022   MPG 116.89 09/28/2021   Lab Results  Component Value Date   PROLACTIN 32.1 (H) 05/03/2020   PROLACTIN 5.3 05/19/2019   Lab Results  Component Value Date   CHOL 159 09/28/2021   TRIG 91 09/28/2021   HDL 51 09/28/2021   CHOLHDL 3.1 09/28/2021   VLDL 18 09/28/2021   LDLCALC 90 09/28/2021   LDLCALC 109 (H) 03/22/2021   Current Medications: Current Facility-Administered Medications  Medication Dose Route Frequency Provider Last Rate Last Admin   acetaminophen (TYLENOL) tablet 650 mg  650 mg Oral Q6H PRN [name], NP       alum & mag hydroxide-simeth (MAALOX/MYLANTA) 200-200-20 MG/5ML suspension 30 mL  30 mL Oral Q4H PRN [name], NP       amLODipine (NORVASC) tablet 10 mg  10 mg Oral Daily Lindell Spar I, NP       benztropine (COGENTIN) tablet 0.5 mg  0.5 mg Oral BID White, Patrice L, NP   0.5 mg at 04/11/22 0816   [START ON 04/12/2022] cholecalciferol (VITAMIN D3) 25 MCG (1000 UNIT) tablet 1,000 Units  1,000 Units Oral Daily Khalif Stender I, NP       gabapentin (NEURONTIN) capsule 100 mg  100 mg Oral TID Encarnacion Slates, NP       Derrill Memo ON 04/12/2022] glipiZIDE (GLUCOTROL XL) 24 hr tablet 10 mg  10 mg Oral Q breakfast Kassidy Frankson I, NP       haloperidol (HALDOL) tablet 10 mg  10 mg  Oral QHS White, Patrice L, NP   10 mg at 04/10/22 2050   insulin aspart (novoLOG) injection 0-5 Units  0-5 Units Subcutaneous QHS White, Patrice L, NP       insulin aspart (novoLOG) injection 0-9 Units  0-9 Units Subcutaneous TID WC White, Patrice L, NP   5 Units at 04/11/22 1300   insulin aspart protamine- aspart (NOVOLOG MIX 70/30) injection 20 Units  20 Units Subcutaneous BID WC Nitya Cauthon I, NP       lithium carbonate (ESKALITH) ER tablet 450 mg  450 mg Oral QHS Theophil Thivierge I, NP       magnesium hydroxide (MILK OF MAGNESIA) suspension 30 mL  30 mL Oral Daily PRN [name], NP       metoprolol tartrate (LOPRESSOR) tablet 12.5 mg  12.5 mg Oral BID White, Patrice L, NP   12.5 mg at 04/11/22 0816   pantoprazole (PROTONIX) EC tablet 40 mg  40 mg Oral Daily Wendel Homeyer, Herbert Pun I, NP       simvastatin (ZOCOR) tablet 40 mg  40 mg Oral QHS Cataleya Cristina I, NP       traZODone (DESYREL) tablet 50 mg  50 mg Oral QHS PRN Bobbitt, Shalon E, NP   50 mg at 04/10/22 2109   venlafaxine XR (EFFEXOR-XR) 24 hr capsule 75 mg  75 mg Oral Q breakfast Mitsy Owen I, NP       PTA Medications: Medications Prior to Admission  Medication Sig Dispense Refill Last Dose   amLODipine (NORVASC) 10 MG tablet Take 10 mg by mouth daily.      benztropine (COGENTIN) 1 MG tablet Take 1 mg by mouth 2 (two) times daily.  Cholecalciferol (VITAMIN D3) 25 MCG (1000 UT) CAPS Take 1,000 Units by mouth daily.      gabapentin (NEURONTIN) 100 MG capsule Take 100 mg by mouth 3 (three) times daily.      glimepiride (AMARYL) 4 MG tablet Take 4 mg by mouth daily with breakfast.      glipiZIDE (GLUCOTROL XL) 10 MG 24 hr tablet Take 10 mg by mouth daily with breakfast.      haloperidol (HALDOL) 10 MG tablet Take 10 mg by mouth 3 (three) times daily.      hydrocortisone 2.5 % ointment Apply 1 Application topically 2 (two) times daily. Apply to affected area for 14 days starting on 03/24/22.      insulin aspart protamine- aspart (NOVOLOG MIX  70/30) (70-30) 100 UNIT/ML injection Inject 20 Units into the skin 2 (two) times daily with a meal.      lithium carbonate (ESKALITH) 450 MG CR tablet Take 450 mg by mouth at bedtime.      metoprolol tartrate (LOPRESSOR) 25 MG tablet Take 12.5 mg by mouth 2 (two) times daily.      nystatin cream (MYCOSTATIN) Apply 1 Application topically 2 (two) times daily.      omeprazole (PRILOSEC) 20 MG capsule Take 20 mg by mouth daily.      simvastatin (ZOCOR) 40 MG tablet Take 40 mg by mouth at bedtime.      venlafaxine (EFFEXOR) 75 MG tablet Take 150 mg by mouth daily.      Musculoskeletal: Strength & Muscle Tone: within normal limits Gait & Station: normal Patient leans: N/A  Psychiatric Specialty Exam:  Presentation  General Appearance:  Casual; Fairly Groomed  Eye Contact: Fair  Speech: Clear and Coherent; Normal Rate  Speech Volume: Normal  Handedness: Right   Mood and Affect  Mood: Depressed; Anxious  Affect: Congruent  Thought Process  Thought Processes: Coherent; Goal Directed  Duration of Psychotic Symptoms: Greater than 20 weeks.  Past Diagnosis of Schizophrenia or Psychoactive disorder: Yes  Descriptions of Associations:Intact  Orientation:Full (Time, Place and Person)  Thought Content:Logical  Hallucinations:Hallucinations: None  Ideas of Reference:None  Suicidal Thoughts:Suicidal Thoughts: Yes, Passive SI Active Intent and/or Plan: Without Intent; Without Means to Carry Out; Without Plan; Without Access to Means SI Passive Intent and/or Plan: Without Intent; Without Means to Carry Out; Without Plan; Without Access to Means  Homicidal Thoughts:Homicidal Thoughts: No  Sensorium  Memory: Immediate Fair; Recent Fair; Remote Fair  Judgment: Fair  Insight: Fair  Community education officer  Concentration: Fair  Attention Span: Fair  Recall: [employer] of Knowledge: Fair  Language: Good  Psychomotor Activity  Psychomotor  Activity: Psychomotor Activity: Normal AIMS Completed?: No  Assets  Assets: Communication Skills; Desire for Improvement; Financial Resources/Insurance; Housing; Physical Health; Resilience; Social Support   Sleep  Sleep: Sleep: Good Number of Hours of Sleep: 7  Physical Exam: Physical Exam Vitals and nursing note reviewed.  Constitutional:      Comments: Patient is obese.  HENT:     Nose: Nose normal.     Mouth/Throat:     Pharynx: Oropharynx is clear.  Cardiovascular:     Rate and Rhythm: Normal rate.     Pulses: Normal pulses.  Pulmonary:     Effort: Pulmonary effort is normal.  Genitourinary:    Comments: Deferred Musculoskeletal:        General: Normal range of motion.     Cervical back: Normal range of motion.  Skin:    General: Skin is warm  and dry.  Neurological:     General: No focal deficit present.     Mental Status: She is alert and oriented to person, place, and time.    Review of Systems  Constitutional:  Negative for chills, diaphoresis and fever.  HENT:  Negative for congestion and sore throat.   Respiratory:  Negative for cough, shortness of breath and wheezing.   Cardiovascular:  Negative for chest pain and palpitations.  Gastrointestinal:  Negative for abdominal pain, constipation, diarrhea, heartburn, nausea and vomiting.  Genitourinary:  Negative for dysuria.  Musculoskeletal:  Negative for joint pain and myalgias.  Skin:  Negative for itching and rash.  Neurological:  Negative for dizziness, tingling, tremors, sensory change, speech change, focal weakness, seizures, loss of consciousness, weakness and headaches.  Endo/Heme/Allergies:        Allergies: See the allergiests. Wellbutrin Omnipaque  Penicillins  Atarax  Iodinated Contrast Media Cymbalta.        Psychiatric/Behavioral:  Positive for depression. Negative for hallucinations (Hx of.), memory loss, substance abuse and suicidal ideas (Hx of). The patient is not nervous/anxious and  does not have insomnia.    Blood pressure 134/73, pulse 94, temperature 98.4 F (36.9 C), temperature source Oral, resp. rate 20, height _0  (1.702 m), weight 51.7 kg, last menstrual period 03/05/2022, SpO2 100 %. Body mass index is 17.85 kg/m.  Treatment Plan Summary: Daily contact with patient to assess and evaluate symptoms and progress in treatment and Medication management.   Diagnoses. 1. Schizoaffective disorder, bipolar-type. 2. Schizophrenia. 3. Major depressive disorder, recurrent episodes.  Plan: Resumed Haldol 10 mg po Q bedtime for psychosis.  Resumed benztropine 0.5 mg po bid for eps prevention.  Resumed gabapentin 100 mg po tid for agitation/anxiety. Resumed Lithium carbonate 450 mg po Q bedtime for mood stabilization. Resumed Effexor XR 75 mg po daily for depression. Continued Trazodone 50 mg po Q bedtime prn for insomnia.  Other medical issues. Resumed amlodipine 10 mg po daily for HTN.  Resumed Vitamin D3 1 tablet po daily for bone health.  Resumed Glucotrol 10 mg po q daily for diabetes mellitus. Resumed Metoprolol 12.5 mg po bid for HTN.  Protonix 40 mg po Q am for GERD.  Simvastatin 40 mg po Q evenings for high cholesterol.  Continue the sliding scale insulin as ordered prn based on blood sugar levels.   Other PRNS -Continue Tylenol 650 mg every 6 hours PRN for mild pain -Continue Maalox 30 ml Q 4 hrs PRN for indigestion -Continue MOM 30 ml po Q 6 hrs for constipation  Safety and Monitoring: Voluntary admission to inpatient psychiatric unit for safety, stabilization and treatment Daily contact with patient to assess and evaluate symptoms and progress in treatment Patient's case to be discussed in multi-disciplinary team meeting Observation Level : q15 minute checks Vital signs: q12 hours Precautions: Safety  Discharge Planning: Social work and case management to assist with discharge planning and identification of hospital follow-up needs prior to  discharge Estimated LOS: 5-7 days Discharge Concerns: Need to establish a safety plan; Medication compliance and effectiveness Discharge Goals: Return home with outpatient referrals for mental health follow-up including medication management/psychotherapy  Observation Level/Precautions:  15 minute checks  Laboratory: Per ED, current lab results reviewed.  Psychotherapy: Enrolled in the group sessions.  Medications: See MAR.    Consultations: As needed.   Discharge Concerns: Safety, mood stability.   Estimated LOS: 3-5 days.  Other: NA   Physician Treatment Plan for Primary Diagnosis: Schizoaffective disorder, bipolar type (  [place])  Long Term Goal(s): Improvement in symptoms so as ready for discharge  Short Term Goals: Ability to identify changes in lifestyle to reduce recurrence of condition will improve, Ability to verbalize feelings will improve, Ability to disclose and discuss suicidal ideas, and Ability to demonstrate self-control will improve  Physician Treatment Plan for Secondary Diagnosis: Principal Problem:   Schizoaffective disorder, bipolar type ([place]) Active Problems:   Severe episode of recurrent major depressive disorder, without psychotic features ([place])   Schizophrenia ([place])  Long Term Goal(s): Improvement in symptoms so as ready for discharge  Short Term Goals: Ability to identify and develop effective coping behaviors will improve, Ability to maintain clinical measurements within normal limits will improve, and Compliance with prescribed medications will improve  I certify that inpatient services furnished can reasonably be expected to improve the patient's condition.    Lindell Spar, NP, pmhnp, fnp-bc 12/16/20232:27 PM

## 2022-04-12 LAB — GLUCOSE, CAPILLARY
Glucose-Capillary: 333 mg/dL — ABNORMAL HIGH (ref 70–99)
Glucose-Capillary: 208 mg/dL — ABNORMAL HIGH (ref 70–99)
Glucose-Capillary: 180 mg/dL — ABNORMAL HIGH (ref 70–99)
Glucose-Capillary: 204 mg/dL — ABNORMAL HIGH (ref 70–99)

## 2022-04-12 NOTE — Progress Notes (Signed)
[name] MD Progress Note  04/12/2022 8:55 AM [name]  MRN:  [id number]  Reason for admission: 31 year old [name] with hx of Schizophrenia, chronic, Schizoaffective disorder, bipolar-type, chronic & major depressive disorder, recurrent episodes. Patient is well know in this [name] from her previous admissions for mood stabilization treatments. Prior to this admission, she was receiving mental health care on an outpatient basis. She is linked with the [facility] with Dr. Alonza Smoker as the medical director. Patient was brought to [name] this time around from the [facility] with complain of worsening symptoms of depression & suicidal ideations with plans to cut herself or overdose on medications. She was in need of further psychiatric evaluation/treatments.   Daily notes: [name] is seen in her room. She is lying down in her bed. She is arousable. She is making a good eye contact & verbally responsive. She is visible on the unit, attending group sessions. She says, "I'm doing alright. My mood is okay, I'm just feeling sleepy". [name] since coming to the [facility] has been out of her room, visible on the unit, attending group sessions. She is taking & tolerating her treatment regimen. Denies any Side effects.She currently denies any SIHI, AVH, delusional thoughts or paranoia. She does not appear to be responding to any internal stimuli. Vital signs remain stable.  Principal Problem: Schizoaffective disorder, bipolar type ([place])  Diagnosis: Principal Problem:   Schizoaffective disorder, bipolar type ([place]) Active Problems:   Severe episode of recurrent major depressive disorder, without psychotic features ([place])   Schizophrenia ([place])  Total Time spent with patient:  35 minutes  Past Psychiatric History: See H&P  Past Medical History:  Past Medical History:  Diagnosis Date   Anxiety    Bipolar 1 disorder ([place])    Cognitive deficits    Depression    Diabetes  mellitus without complication ([place])    Hypertension    Mental disorder    Mental health disorder    Obesity     Past Surgical History:  Procedure Laterality Date   CESAREAN SECTION     CESAREAN SECTION N/A 04/25/2013   Procedure: REPEAT CESAREAN SECTION;  Surgeon: [name], MD;  Location: [facility];  Service: Obstetrics;  Laterality: N/A;   MASS EXCISION N/A 06/03/2012   Procedure: EXCISION MASS;  Surgeon: [name], MD;  Location: [facility];  Service: ENT;  Laterality: N/A;  Excision uvula mass   TONSILLECTOMY N/A 06/03/2012   Procedure: TONSILLECTOMY;  Surgeon: [name], MD;  Location: [facility];  Service: ENT;  Laterality: N/A;   TONSILLECTOMY     Family History:  Family History  Problem Relation Age of Onset   Hypertension Mother    Diabetes Father    Family Psychiatric  History: See H&P.  Social History:  Social History   Substance and Sexual Activity  Alcohol Use Not Currently     Social History   Substance and Sexual Activity  Drug Use Not Currently   Types: "Crack" cocaine, Other-see comments   Comment: Patient reports hx of smoking Crack    Social History   Socioeconomic History   Marital status: Single    Spouse name: Not on file   Number of children: Not on file   Years of education: Not on file   Highest education level: Not on file  Occupational History   Not on file  Tobacco Use   Smoking status: Every Day    Types:  Cigars   Smokeless tobacco: Never   Tobacco comments:    Pt declined  Vaping Use   Vaping Use: Never used  Substance and Sexual Activity   Alcohol use: Not Currently   Drug use: Not Currently    Types: "Crack" cocaine, Other-see comments    Comment: Patient reports hx of smoking Crack   Sexual activity: Not Currently    Birth control/protection: None    Comment: occasionally  Other Topics Concern   Not on file  Social History Narrative   ** Merged History Encounter **        Social Determinants of Health   Financial Resource Strain: Not on file  Food Insecurity: No Food Insecurity (04/10/2022)   Hunger Vital Sign    Worried About Running Out of Food in the Last Year: Never true    [place] in the Last Year: Never true  Transportation Needs: No Transportation Needs (04/10/2022)   PRAPARE - Hydrologist (Medical): No    Lack of Transportation (Non-Medical): No  Physical Activity: Not on file  Stress: Not on file  Social Connections: Not on file   Additional Social History:   Sleep: Good  Appetite:  Good  Current Medications: Current Facility-Administered Medications  Medication Dose Route Frequency Provider Last Rate Last Admin   acetaminophen (TYLENOL) tablet 650 mg  650 mg Oral Q6H PRN [name], NP       alum & mag hydroxide-simeth (MAALOX/MYLANTA) 200-200-20 MG/5ML suspension 30 mL  30 mL Oral Q4H PRN [name], NP       amLODipine (NORVASC) tablet 10 mg  10 mg Oral Daily Lindell Spar I, NP   10 mg at 04/11/22 1427   benztropine (COGENTIN) tablet 0.5 mg  0.5 mg Oral BID White, Patrice L, NP   0.5 mg at 04/11/22 1642   cholecalciferol (VITAMIN D3) 25 MCG (1000 UNIT) tablet 1,000 Units  1,000 Units Oral Daily Dakhari Zuver I, NP       gabapentin (NEURONTIN) capsule 100 mg  100 mg Oral TID Lindell Spar I, NP   100 mg at 04/11/22 1643   glipiZIDE (GLUCOTROL XL) 24 hr tablet 10 mg  10 mg Oral Q breakfast Azarya Oconnell, Herbert Pun I, NP       haloperidol (HALDOL) tablet 10 mg  10 mg Oral QHS White, Patrice L, NP   10 mg at 04/11/22 2055   insulin aspart (novoLOG) injection 0-5 Units  0-5 Units Subcutaneous QHS White, Patrice L, NP       insulin aspart (novoLOG) injection 0-9 Units  0-9 Units Subcutaneous TID WC White, Patrice L, NP   7 Units at 04/12/22 5409   insulin aspart protamine- aspart (NOVOLOG MIX 70/30) injection 20 Units  20 Units Subcutaneous BID WC Jaivion Kingsley, Herbert Pun I, NP   20 Units at 04/11/22 1757   lithium  carbonate (ESKALITH) ER tablet 450 mg  450 mg Oral QHS Lindell Spar I, NP   450 mg at 04/11/22 2042   magnesium hydroxide (MILK OF MAGNESIA) suspension 30 mL  30 mL Oral Daily PRN [name], NP       metoprolol tartrate (LOPRESSOR) tablet 12.5 mg  12.5 mg Oral BID White, Patrice L, NP   12.5 mg at 04/11/22 1642   pantoprazole (PROTONIX) EC tablet 40 mg  40 mg Oral Daily Elwood Bazinet, Herbert Pun I, NP   40 mg at 04/11/22 1427   simvastatin (ZOCOR) tablet 40 mg  40 mg Oral QHS Teddie Mehta,  Herbert Pun I, NP   40 mg at 04/11/22 2042   traZODone (DESYREL) tablet 50 mg  50 mg Oral QHS PRN Bobbitt, Shalon E, NP   50 mg at 04/11/22 2042   venlafaxine XR (EFFEXOR-XR) 24 hr capsule 75 mg  75 mg Oral Q breakfast Lindell Spar I, NP   75 mg at 04/11/22 1428    Lab Results:  Results for orders placed or performed during the [name] encounter of 04/10/22 (from the past 48 hour(s))  Glucose, capillary     Status: Abnormal   Collection Time: 04/10/22  4:43 PM  Result Value Ref Range   Glucose-Capillary 205 (H) 70 - 99 mg/dL    Comment: Glucose reference range applies only to samples taken after fasting for at least 8 hours.  Glucose, capillary     Status: Abnormal   Collection Time: 04/10/22  8:06 PM  Result Value Ref Range   Glucose-Capillary 200 (H) 70 - 99 mg/dL    Comment: Glucose reference range applies only to samples taken after fasting for at least 8 hours.  Glucose, capillary     Status: Abnormal   Collection Time: 04/11/22  6:03 AM  Result Value Ref Range   Glucose-Capillary 219 (H) 70 - 99 mg/dL    Comment: Glucose reference range applies only to samples taken after fasting for at least 8 hours.  Lithium level     Status: Abnormal   Collection Time: 04/11/22  6:38 AM  Result Value Ref Range   Lithium Lvl <0.06 (L) 0.60 - 1.20 mmol/L    Comment: Performed at [facility] [address], [place], [place] [address]  Glucose, capillary     Status: Abnormal   Collection Time: 04/11/22 12:06 PM  Result  Value Ref Range   Glucose-Capillary 271 (H) 70 - 99 mg/dL    Comment: Glucose reference range applies only to samples taken after fasting for at least 8 hours.  Glucose, capillary     Status: Abnormal   Collection Time: 04/11/22  5:30 PM  Result Value Ref Range   Glucose-Capillary 205 (H) 70 - 99 mg/dL    Comment: Glucose reference range applies only to samples taken after fasting for at least 8 hours.  Glucose, capillary     Status: Abnormal   Collection Time: 04/11/22  8:13 PM  Result Value Ref Range   Glucose-Capillary 151 (H) 70 - 99 mg/dL    Comment: Glucose reference range applies only to samples taken after fasting for at least 8 hours.  Glucose, capillary     Status: Abnormal   Collection Time: 04/12/22  5:10 AM  Result Value Ref Range   Glucose-Capillary 333 (H) 70 - 99 mg/dL    Comment: Glucose reference range applies only to samples taken after fasting for at least 8 hours.   Blood Alcohol level:  Lab Results  Component Value Date   ETH <10 04/06/2022   ETH <10 60/45/4098   Metabolic Disorder Labs: Lab Results  Component Value Date   HGBA1C 7.6 (H) 01/10/2022   MPG 171.42 01/10/2022   MPG 116.89 09/28/2021   Lab Results  Component Value Date   PROLACTIN 32.1 (H) 05/03/2020   PROLACTIN 5.3 05/19/2019   Lab Results  Component Value Date   CHOL 159 09/28/2021   TRIG 91 09/28/2021   HDL 51 09/28/2021   CHOLHDL 3.1 09/28/2021   VLDL 18 09/28/2021   LDLCALC 90 09/28/2021   LDLCALC 109 (H) 03/22/2021   Physical Findings: AIMS:  , ,  ,  ,  CIWA:    COWS:     Musculoskeletal: Strength & Muscle Tone: within normal limits Gait & Station: normal Patient leans: N/A  Psychiatric Specialty Exam:  Presentation  General Appearance:  Casual; Fairly Groomed  Eye Contact: Fair  Speech: Clear and Coherent; Normal Rate  Speech Volume: Normal  Handedness: Right  Mood and Affect  Mood: Depressed; Anxious  Affect: Congruent   Thought Process   Thought Processes: Coherent; Goal Directed  Descriptions of Associations:Intact  Orientation:Full (Time, Place and Person)  Thought Content:Logical  History of Schizophrenia/Schizoaffective disorder:Yes  Duration of Psychotic Symptoms:Greater than six months  Hallucinations:Hallucinations: None  Ideas of Reference:None  Suicidal Thoughts:Suicidal Thoughts: Yes, Passive SI Active Intent and/or Plan: Without Intent; Without Means to Carry Out; Without Plan; Without Access to Means SI Passive Intent and/or Plan: Without Intent; Without Means to Carry Out; Without Plan; Without Access to Means  Homicidal Thoughts:Homicidal Thoughts: No  Sensorium  Memory: Immediate Fair; Recent Fair; Remote Fair  Judgment: Fair  Insight: Fair   Community education officer  Concentration: Fair  Attention Span: Fair  Recall: [employer] of Knowledge: Fair  Language: Good  Psychomotor Activity  Psychomotor Activity: Psychomotor Activity: Normal AIMS Completed?: No  Assets  Assets: Communication Skills; Desire for Improvement; Financial Resources/Insurance; Housing; Physical Health; Resilience; Social Support  Sleep  Sleep: Sleep: Good Number of Hours of Sleep: 7  Physical Exam: Physical Exam Vitals and nursing note reviewed.  HENT:     Nose: Nose normal.     Mouth/Throat:     Pharynx: Oropharynx is clear.  Cardiovascular:     Rate and Rhythm: Normal rate.     Pulses: Normal pulses.  Pulmonary:     Effort: Pulmonary effort is normal.  Genitourinary:    Comments: Deferred Musculoskeletal:        General: Normal range of motion.     Cervical back: Normal range of motion.  Skin:    General: Skin is warm and dry.  Neurological:     General: No focal deficit present.     Mental Status: She is alert and oriented to person, place, and time.   Review of Systems  Constitutional:  Negative for chills, diaphoresis and fever.  HENT:  Negative for congestion and sore  throat.   Respiratory:  Negative for cough, shortness of breath and wheezing.   Cardiovascular:  Negative for chest pain and palpitations.  Gastrointestinal:  Negative for abdominal pain, constipation, diarrhea, heartburn, nausea and vomiting.  Musculoskeletal:  Negative for joint pain and myalgias.  Skin:  Negative for itching and rash.  Neurological:  Negative for dizziness, tingling, tremors, sensory change, speech change, focal weakness, seizures, loss of consciousness, weakness and headaches.  Endo/Heme/Allergies:        See allergy lists.  Psychiatric/Behavioral:  Positive for depression. Negative for hallucinations, memory loss, substance abuse and suicidal ideas. The patient is not nervous/anxious and does not have insomnia.    Blood pressure 118/74, pulse 85, temperature 97.9 F (36.6 C), temperature source Oral, resp. rate 20, height '5\' 7"'$  (1.702 m), weight 51.7 kg, last menstrual period 03/05/2022, SpO2 99 %. Body mass index is 17.85 kg/m.  Treatment Plan Summary: Daily contact with patient to assess and evaluate symptoms and progress in treatment and Medication management.   Continue inpatient hospitalization.  Will continue today 04/12/2022 plan as below except where it is noted.   Diagnoses. 1. Schizoaffective disorder, bipolar-type. 2. Schizophrenia. 3. Major depressive disorder, recurrent episodes.  Plan: Continue Haldol 10 mg po  Q bedtime for psychosis.  Continue benztropine 0.5 mg po bid for eps prevention.  Continue gabapentin 100 mg po tid for agitation/anxiety. Continue Lithium carbonate 450 mg po Q bedtime for mood stabilization. Continue Effexor XR 75 mg po daily for depression. Continue Trazodone 50 mg po Q bedtime prn for insomnia.   Other medical issues. Continue amlodipine 10 mg po daily for HTN.  Continue Vitamin D3 1 tablet po daily for bone health.  Continue Glucotrol 10 mg po q daily for diabetes mellitus. Continue Metoprolol 12.5 mg po bid for  HTN.  Continue Protonix 40 mg po Q am for GERD.  Continue Simvastatin 40 mg po Q evenings for high cholesterol.  Continue the sliding scale insulin as ordered prn based on blood sugar levels.   Other PRNS -Continue Tylenol 650 mg every 6 hours PRN for mild pain -Continue Maalox 30 ml Q 4 hrs PRN for indigestion -Continue MOM 30 ml po Q 6 hrs for constipation   Safety and Monitoring: Voluntary admission to inpatient psychiatric unit for safety, stabilization and treatment Daily contact with patient to assess and evaluate symptoms and progress in treatment Patient's case to be discussed in multi-disciplinary team meeting Observation Level : q15 minute checks Vital signs: q12 hours Precautions: Safety   Discharge Planning: Social work and case management to assist with discharge planning and identification of [name] follow-up needs prior to discharge Estimated LOS: 5-7 days Discharge Concerns: Need to establish a safety plan; Medication compliance and effectiveness Discharge Goals: Return home with outpatient referrals for mental health follow-up including medication management/psychotherapy  Lindell Spar, NP, pmhnp, fnp-bc 04/12/2022, 8:55 AM

## 2022-04-12 NOTE — Group Note (Deleted)
LCSW Group Therapy Note   Group Date: 04/12/2022 Start Time: 1000 End Time: 1100   Type of Therapy and Topic:  Group Therapy:   Participation Level:  {[facility] PARTICIPATION NPIOP:[id number]}  Description of Group:   Therapeutic Goals:  1.     Summary of Patient Progress:    ***  Therapeutic Modalities:   Maretta Los, LCSWA 04/12/2022  9:18 AM

## 2022-04-12 NOTE — [facility] Group Notes (Signed)
Adult Psychoeducational Group Note Date:  04/12/2022 Time:  0900-1000 Group Topic/Focus: PROGRESSIVE RELAXATION. A group where deep breathing is taught and tensing and relaxation muscle groups is used. Imagery is used as well.  Pts are asked to imagine 3 pillars that hold them up when they are not able to hold themselves up and to share that with the group.   Participation Level:  did not attend Additional Comments:     : Paulino Rily

## 2022-04-12 NOTE — Group Note (Signed)
[place] LCSW Group Therapy Note  04/12/2022  10:00-11:00AM  Type of Therapy and Topic:  Group Therapy:  Adding Supports Including Myself  Participation Level:  Did Not Attend   Description of Group:  Patients in this group were introduced to the differences between healthy supports and unheathy supports.  This led to a lengthy and emotional discussion about how to set boundaries, particularly with family members.  Many in group expressed that they put other people before themselves.  The group discussed that this not only hurts them, but ultimately ends up hurting the people they want to help.  Examples were given about how to set boundaries one at a time rather than merely cutting people off.  A song entitled "My Own Hero" was played.  A group discussion ensued in which patients stated they could relate to the song and it inspired them to realize they have be willing to help themselves in order to succeed, because other people cannot achieve sobriety or stability for them.  We discussed adding a variety of healthy supports to address the various needs in our lives.    Therapeutic Goals: 1)  demonstrate the importance of being a part of one's own support system by asking for and accepting help 2)  discuss reasons people in one's life may eventually be unable to be continually supportive  3)  identify the patient's current support system and   4)  elicit commitments to add healthy supports and to become more conscious of being self-supportive   Summary of Patient Progress:  N/A  Therapeutic Modalities:   Motivational Interviewing Activity  Maretta Los

## 2022-04-12 NOTE — Progress Notes (Signed)
   04/12/22 1700  Psychosocial Assessment  Patient Complaints Anxiety;Depression  Eye Contact Fair  Facial Expression Sad  Affect Appropriate to circumstance  Speech Logical/coherent  Interaction Assertive  Motor Activity Slow  Appearance/Hygiene Unremarkable  Behavior Characteristics Cooperative;Appropriate to situation  Mood Depressed;Anxious;Pleasant  Thought Process  Coherency WDL  Content WDL  Delusions None reported or observed  Perception WDL  Hallucination None reported or observed  Judgment WDL  Confusion None  Danger to Self  Current suicidal ideation? Denies  Self-Injurious Behavior No self-injurious ideation or behavior indicators observed or expressed   Agreement Not to Harm Self Yes  Description of Agreement verbal  Danger to Others  Danger to Others None reported or observed

## 2022-04-12 NOTE — Progress Notes (Signed)
Adult Psychoeducational Group Note  Date:  04/12/2022 Time:  12:34 AM  Group Topic/Focus:  Wrap-Up Group:   The focus of this group is to help patients review their daily goal of treatment and discuss progress on daily workbooks.  Participation Level:  Active  Participation Quality:  Appropriate  Affect:  Appropriate  Cognitive:  Appropriate  Insight: Appropriate  Engagement in Group:  Developing/Improving  Modes of Intervention:  Discussion  Additional Comments:  Pt stated her goal for today was to focus on her treatment plan and talked to her social worker about aftercare plans.. Pt stated she accomplished her goals today. Pt stated she talked with her doctor and with her social worker about her care today. Pt rated her overall day a 8 out of 10. Pt stated she was able to contact her mother today which improved her overall day. Pt stated she felt better about herself tonight. Pt stated she was able to attend all meals today. Pt stated she took all medications provided today. Pt stated her appetite was pretty good today. Pt rated her sleep last night was pretty good. Pt stated the goal tonight was to get some rest. Pt stated she had no physical pain tonight. Pt deny visual hallucinations and auditory issues tonight. Pt denies thoughts of harming herself or others. Pt stated she would alert staff if anything changed.  Candy Sledge 04/12/2022, 12:34 AM

## 2022-04-12 NOTE — Progress Notes (Signed)
Adult Psychoeducational Group Note  Date:  04/12/2022 Time:  8:58 PM  Group Topic/Focus:  Wrap-Up Group:   The focus of this group is to help patients review their daily goal of treatment and discuss progress on daily workbooks.  Participation Level:  Active  Participation Quality:  Appropriate  Affect:  Appropriate  Cognitive:  Appropriate  Insight: Appropriate  Engagement in Group:  Improving  Modes of Intervention:  Discussion  Additional Comments:   Pt stated her goal for today was to focus on her treatment plan. Pt stated she accomplished her goal today. Pt stated she talked with her doctor but did not get a chance to speak with her social worker about her care today. Pt rated her overall day a 7 out of 10. Pt stated she was able to contact her mother today which improved her overall day. Pt stated she felt better about herself tonight. Pt stated she took all medications provided today. Pt stated her appetite was pretty good today. Pt rated her sleep last night was pretty good. Pt stated the goal tonight was to get some rest. Pt stated she had no physical pain tonight. Pt deny visual hallucinations and auditory issues tonight. Pt denies thoughts of harming herself or others. Pt stated she would alert staff if anything changed.  Candy Sledge 04/12/2022, 8:58 PM

## 2022-04-12 NOTE — Progress Notes (Signed)
   04/12/22 0000  Psych Admission Type (Psych Patients Only)  Admission Status Voluntary  Psychosocial Assessment  Patient Complaints Hyperactivity  Eye Contact Fair  Facial Expression Animated  Affect Appropriate to circumstance  Speech Pressured  Interaction Dominating  Motor Activity Fidgety  Appearance/Hygiene Unremarkable  Behavior Characteristics Appropriate to situation;Cooperative  Mood Pleasant  Thought Process  Coherency Tangential  Content WDL  Delusions None reported or observed  Perception WDL  Hallucination None reported or observed  Judgment WDL  Confusion None  Danger to Self  Current suicidal ideation? Denies  Self-Injurious Behavior No self-injurious ideation or behavior indicators observed or expressed   Agreement Not to Harm Self Yes  Description of Agreement verbal  Danger to Others  Danger to Others None reported or observed

## 2022-04-13 ENCOUNTER — Encounter (HOSPITAL_COMMUNITY): Payer: Self-pay

## 2022-04-13 LAB — GLUCOSE, CAPILLARY
Glucose-Capillary: 294 mg/dL — ABNORMAL HIGH (ref 70–99)
Glucose-Capillary: 158 mg/dL — ABNORMAL HIGH (ref 70–99)
Glucose-Capillary: 181 mg/dL — ABNORMAL HIGH (ref 70–99)
Glucose-Capillary: 238 mg/dL — ABNORMAL HIGH (ref 70–99)

## 2022-04-13 NOTE — Progress Notes (Signed)
   04/12/22 2100  Psych Admission Type (Psych Patients Only)  Admission Status Voluntary  Psychosocial Assessment  Patient Complaints Hyperactivity  Eye Contact Fair  Facial Expression Animated  Affect Appropriate to circumstance  Speech Logical/coherent  Interaction Assertive  Motor Activity Hyperactive  Appearance/Hygiene Unremarkable  Behavior Characteristics Cooperative;Appropriate to situation  Mood Pleasant  Thought Process  Coherency WDL  Content WDL  Delusions None reported or observed  Perception WDL  Hallucination None reported or observed  Judgment WDL  Confusion None  Danger to Self  Current suicidal ideation? Denies  Self-Injurious Behavior No self-injurious ideation or behavior indicators observed or expressed   Agreement Not to Harm Self Yes  Description of Agreement verbal  Danger to Others  Danger to Others None reported or observed

## 2022-04-13 NOTE — Progress Notes (Signed)
   04/13/22 2200  Psych Admission Type (Psych Patients Only)  Admission Status Voluntary  Psychosocial Assessment  Patient Complaints None  Eye Contact Fair  Facial Expression Animated  Affect Appropriate to circumstance  Speech Logical/coherent  Interaction Assertive  Motor Activity Hyperactive  Appearance/Hygiene Unremarkable  Behavior Characteristics Cooperative;Intrusive  Mood Pleasant  Aggressive Behavior  Effect No apparent injury  Thought Process  Coherency WDL  Content WDL  Delusions WDL  Perception WDL  Hallucination None reported or observed  Judgment WDL  Confusion None  Danger to Self  Current suicidal ideation? Denies

## 2022-04-13 NOTE — Group Note (Addendum)
LCSW Group Therapy Note   Group Date: 04/13/2022 Start Time: 1300 End Time: 1400   Type of Therapy and Topic:  Group Therapy: Setting Goals   Participation Level:  Did Not Attend  Description of Group: In this process group, patients discussed using strengths to work toward goals and address challenges.  Patients identified two positive things about themselves and one goal they were working on.  Patients were given the opportunity to share openly and support each other's plan for self-empowerment.  The group discussed the value of gratitude and were encouraged to have a daily reflection of positive characteristics or circumstances.  Patients were encouraged to identify a plan to utilize their strengths to work on current challenges and goals.   Therapeutic Goals Patient will verbalize personal strengths/positive qualities and relate how these can assist with achieving desired personal goals Patients will verbalize affirmation of peers plans for personal change and goal setting Patients will explore the value of gratitude and positive focus as related to successful achievement of goals Patients will verbalize a plan for regular reinforcement of personal positive qualities and circumstances.   Summary of Patient Progress: Did Not Attend       Therapeutic Modalities Cognitive Behavioral Therapy Motivational Interviewing  Charlett Lango 04/13/2022  2:36 PM

## 2022-04-13 NOTE — Group Note (Signed)
Recreation Therapy Group Note   Group Topic:Coping Skills  Group Date: 04/13/2022 Start Time: 6301 End Time: 1048 Facilitators: Lillionna Nabi-McCall, LRT,CTRS Location: [address] Dayroom   Goal Area(s) Addresses: Patient will define what a coping skill is. Patient will work with peer to create a list of healthy coping skills beginning with each letter of the alphabet. Patient will successfully identify positive coping skills they can use post d/c.  Patient will acknowledge benefit(s) of using learned coping skills post d/c.  Group Description: Coping A to Z. Patient asked to identify what a coping skill is and when they use them. Patients with Probation officer discussed healthy versus unhealthy coping skills. Next patients were given a blank worksheet titled "Coping Skills A-Z".  Patients were instructed to come up with at least one positive coping skill per letter of the alphabet, addressing a specific challenge (ex: stress, anger, anxiety, depression, grief, doubt, isolation, self-harm/suicidal thoughts, substance use). Patients were given 15 minutes to brainstorm before ideas were presented to the large group. Patients and LRT debriefed on the importance of coping skill selection based on situation and back-up plans when a skill tried is not effective. At the end of group, patients were given an handout of alphabetized strategies to keep for future reference.   Affect/Mood: N/A   Participation Level: Did not attend    Clinical Observations/Individualized Feedback:      Plan: Continue to engage patient in RT group sessions 2-3x/week.   [name], LRT,CTRS 04/13/2022 1:00 PM

## 2022-04-13 NOTE — BHH Group Notes (Signed)
Adult Psychoeducational Group Note  Date:  04/13/2022 Time:  10:02 AM  Group Topic/Focus:  Goals Group:   The focus of this group is to help patients establish daily goals to achieve during treatment and discuss how the patient can incorporate goal setting into their daily lives to aide in recovery.  Participation Level:  Did Not Attend   [name] 04/13/2022, 10:02 AM

## 2022-04-13 NOTE — BH IP Treatment Plan (Signed)
Interdisciplinary Treatment and Diagnostic Plan Update  04/13/2022 Time of Session: 9:55am [name] MRN: [id number]  Principal Diagnosis: Schizoaffective disorder, bipolar type ([place])  Secondary Diagnoses: Principal Problem:   Schizoaffective disorder, bipolar type ([place]) Active Problems:   Severe episode of recurrent major depressive disorder, without psychotic features ([place])   Schizophrenia ([place])   Current Medications:  Current Facility-Administered Medications  Medication Dose Route Frequency Provider Last Rate Last Admin   acetaminophen (TYLENOL) tablet 650 mg  650 mg Oral Q6H PRN [name], NP       alum & mag hydroxide-simeth (MAALOX/MYLANTA) 200-200-20 MG/5ML suspension 30 mL  30 mL Oral Q4H PRN [name], NP       amLODipine (NORVASC) tablet 10 mg  10 mg Oral Daily Lindell Spar I, NP   10 mg at 04/13/22 0837   benztropine (COGENTIN) tablet 0.5 mg  0.5 mg Oral BID White, Patrice L, NP   0.5 mg at 04/13/22 4128   cholecalciferol (VITAMIN D3) 25 MCG (1000 UNIT) tablet 1,000 Units  1,000 Units Oral Daily Lindell Spar I, NP   1,000 Units at 04/13/22 0837   gabapentin (NEURONTIN) capsule 100 mg  100 mg Oral TID Lindell Spar I, NP   100 mg at 04/13/22 0837   glipiZIDE (GLUCOTROL XL) 24 hr tablet 10 mg  10 mg Oral Q breakfast Lindell Spar I, NP   10 mg at 04/13/22 0837   haloperidol (HALDOL) tablet 10 mg  10 mg Oral QHS White, Patrice L, NP   10 mg at 04/12/22 2103   insulin aspart (novoLOG) injection 0-5 Units  0-5 Units Subcutaneous QHS White, Patrice L, NP   2 Units at 04/12/22 2103   insulin aspart (novoLOG) injection 0-9 Units  0-9 Units Subcutaneous TID WC White, Patrice L, NP   5 Units at 04/13/22 0700   insulin aspart protamine- aspart (NOVOLOG MIX 70/30) injection 20 Units  20 Units Subcutaneous BID WC Nwoko, Herbert Pun I, NP   20 Units at 04/13/22 0846   lithium carbonate (ESKALITH) ER tablet 450 mg  450 mg Oral QHS Lindell Spar I, NP   450 mg at 04/12/22 2103    magnesium hydroxide (MILK OF MAGNESIA) suspension 30 mL  30 mL Oral Daily PRN [name], NP       metoprolol tartrate (LOPRESSOR) tablet 12.5 mg  12.5 mg Oral BID White, Patrice L, NP   12.5 mg at 04/13/22 0837   pantoprazole (PROTONIX) EC tablet 40 mg  40 mg Oral Daily Nwoko, Herbert Pun I, NP   40 mg at 04/13/22 0837   simvastatin (ZOCOR) tablet 40 mg  40 mg Oral QHS Nwoko, Herbert Pun I, NP   40 mg at 04/12/22 2103   traZODone (DESYREL) tablet 50 mg  50 mg Oral QHS PRN Bobbitt, Shalon E, NP   50 mg at 04/11/22 2042   venlafaxine XR (EFFEXOR-XR) 24 hr capsule 75 mg  75 mg Oral Q breakfast Lindell Spar I, NP   75 mg at 04/13/22 0836   PTA Medications: Medications Prior to Admission  Medication Sig Dispense Refill Last Dose   amLODipine (NORVASC) 10 MG tablet Take 10 mg by mouth daily.      benztropine (COGENTIN) 1 MG tablet Take 1 mg by mouth 2 (two) times daily.      Cholecalciferol (VITAMIN D3) 25 MCG (1000 UT) CAPS Take 1,000 Units by mouth daily.      gabapentin (NEURONTIN) 100 MG capsule Take 100 mg by mouth 3 (three) times daily.  glimepiride (AMARYL) 4 MG tablet Take 4 mg by mouth daily with breakfast.      glipiZIDE (GLUCOTROL XL) 10 MG 24 hr tablet Take 10 mg by mouth daily with breakfast.      haloperidol (HALDOL) 10 MG tablet Take 10 mg by mouth 3 (three) times daily.      hydrocortisone 2.5 % ointment Apply 1 Application topically 2 (two) times daily. Apply to affected area for 14 days starting on 03/24/22.      insulin aspart protamine- aspart (NOVOLOG MIX 70/30) (70-30) 100 UNIT/ML injection Inject 20 Units into the skin 2 (two) times daily with a meal.      lithium carbonate (ESKALITH) 450 MG CR tablet Take 450 mg by mouth at bedtime.      metoprolol tartrate (LOPRESSOR) 25 MG tablet Take 12.5 mg by mouth 2 (two) times daily.      nystatin cream (MYCOSTATIN) Apply 1 Application topically 2 (two) times daily.      omeprazole (PRILOSEC) 20 MG capsule Take 20 mg by mouth daily.       simvastatin (ZOCOR) 40 MG tablet Take 40 mg by mouth at bedtime.      venlafaxine (EFFEXOR) 75 MG tablet Take 150 mg by mouth daily.       Patient Stressors: Medication change or noncompliance   Occupational concerns    Patient Strengths: Capable of independent living  Communication skills  Religious Affiliation  Supportive family/friends   Treatment Modalities: Medication Management, Group therapy, Case management,  1 to 1 session with clinician, Psychoeducation, Recreational therapy.   Physician Treatment Plan for Primary Diagnosis: Schizoaffective disorder, bipolar type ([place]) Long Term Goal(s): Improvement in symptoms so as ready for discharge   Short Term Goals: Ability to identify and develop effective coping behaviors will improve Ability to maintain clinical measurements within normal limits will improve Compliance with prescribed medications will improve Ability to identify changes in lifestyle to reduce recurrence of condition will improve Ability to verbalize feelings will improve Ability to disclose and discuss suicidal ideas Ability to demonstrate self-control will improve  Medication Management: Evaluate patient's response, side effects, and tolerance of medication regimen.  Therapeutic Interventions: 1 to 1 sessions, Unit Group sessions and Medication administration.  Evaluation of Outcomes: Progressing  Physician Treatment Plan for Secondary Diagnosis: Principal Problem:   Schizoaffective disorder, bipolar type ([place]) Active Problems:   Severe episode of recurrent major depressive disorder, without psychotic features ([place])   Schizophrenia ([place])  Long Term Goal(s): Improvement in symptoms so as ready for discharge   Short Term Goals: Ability to identify and develop effective coping behaviors will improve Ability to maintain clinical measurements within normal limits will improve Compliance with prescribed medications will improve Ability to identify changes in  lifestyle to reduce recurrence of condition will improve Ability to verbalize feelings will improve Ability to disclose and discuss suicidal ideas Ability to demonstrate self-control will improve     Medication Management: Evaluate patient's response, side effects, and tolerance of medication regimen.  Therapeutic Interventions: 1 to 1 sessions, Unit Group sessions and Medication administration.  Evaluation of Outcomes: Progressing   RN Treatment Plan for Primary Diagnosis: Schizoaffective disorder, bipolar type ([place]) Long Term Goal(s): Knowledge of disease and therapeutic regimen to maintain health will improve  Short Term Goals: Ability to remain free from injury will improve, Ability to verbalize frustration and anger appropriately will improve, Ability to demonstrate self-control, Ability to participate in decision making will improve, Ability to verbalize feelings will improve, Ability to disclose  and discuss suicidal ideas, Ability to identify and develop effective coping behaviors will improve, and Compliance with prescribed medications will improve  Medication Management: RN will administer medications as ordered by provider, will assess and evaluate patient's response and provide education to patient for prescribed medication. RN will report any adverse and/or side effects to prescribing provider.  Therapeutic Interventions: 1 on 1 counseling sessions, Psychoeducation, Medication administration, Evaluate responses to treatment, Monitor vital signs and CBGs as ordered, Perform/monitor CIWA, COWS, AIMS and Fall Risk screenings as ordered, Perform wound care treatments as ordered.  Evaluation of Outcomes: Progressing   LCSW Treatment Plan for Primary Diagnosis: Schizoaffective disorder, bipolar type ([place]) Long Term Goal(s): Safe transition to appropriate next level of care at discharge, Engage patient in therapeutic group addressing interpersonal concerns.  Short Term Goals: Engage  patient in aftercare planning with referrals and resources, Increase social support, Increase ability to appropriately verbalize feelings, Increase emotional regulation, Facilitate acceptance of mental health diagnosis and concerns, Facilitate patient progression through stages of change regarding substance use diagnoses and concerns, Identify triggers associated with mental health/substance abuse issues, and Increase skills for wellness and recovery  Therapeutic Interventions: Assess for all discharge needs, 1 to 1 time with Social worker, Explore available resources and support systems, Assess for adequacy in community support network, Educate family and significant other(s) on suicide prevention, Complete Psychosocial Assessment, Interpersonal group therapy.  Evaluation of Outcomes: Progressing   Progress in Treatment: Attending groups: Yes. Participating in groups: Yes. Taking medication as prescribed: Yes. Toleration medication: Yes. Family/Significant other contact made: No, will contact:  Pamala Hurry, mother, [phone] Patient understands diagnosis: Yes. Discussing patient identified problems/goals with staff: Yes. Medical problems stabilized or resolved: Yes. Denies suicidal/homicidal ideation: Yes. Issues/concerns per patient self-inventory: No.   New problem(s) identified: No, Describe:  none reported  New Short Term/Long Term Goal(s):   medication stabilization, elimination of SI thoughts, development of comprehensive mental wellness plan.    Patient Goals:  Pt states, "I want to stop negative thinking"  Discharge Plan or Barriers: Patient recently admitted. CSW will continue to follow and assess for appropriate referrals and possible discharge planning.    Reason for Continuation of Hospitalization: Anxiety Delusions  Depression Hallucinations Medication stabilization Suicidal ideation  Estimated Length of Stay: 1-5 days  Last 3 [place] Suicide Severity Risk  Score: [place] Admission (Current) from 04/10/2022 in [facility] ED from 04/09/2022 in [facility] ED from 04/06/2022 in [facility]  C-SSRS RISK CATEGORY Moderate Risk High Risk No Risk       Last PHQ 2/9 Scores:    04/09/2022    8:58 PM 01/10/2022    6:13 PM 10/24/2021   11:07 AM  Depression screen PHQ 2/9  Decreased Interest '1 2 1  '$ Down, Depressed, Hopeless '1 2 1  '$ PHQ - 2 Score '2 4 2  '$ Altered sleeping '2 2 1  '$ Tired, decreased energy '2 1 1  '$ Change in appetite 1 2 0  Feeling bad or failure about yourself  '2 2 1  '$ Trouble concentrating '1 2 1  '$ Moving slowly or fidgety/restless 1 2 0  Suicidal thoughts '1 2 1  '$ PHQ-9 Score '12 17 7  '$ Difficult doing work/chores Somewhat difficult Very difficult Somewhat difficult    Scribe for Treatment Team: Zachery Conch, LCSW 04/13/2022 1:29 PM
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
## 2022-04-13 NOTE — Progress Notes (Signed)
[facility] MD Progress Note  04/13/2022 1:16 PM [name]  MRN:  [id number]  Reason for admission: 31 year old [name] with hx of Schizophrenia, chronic, Schizoaffective disorder, bipolar-type, chronic & major depressive disorder, recurrent episodes. Patient is well know in this hospital & other [place] systems from her previous admissions for mood stabilization treatments. Prior to this admission, she was receiving mental health care on an outpatient basis. She is linked with the [facility] with Dr. Alonza Smoker as the medical director. Patient was brought to [facility] this time around from the [facility] with complain of worsening symptoms of depression & suicidal ideations with plans to cut herself or overdose on medications. She was in need of further psychiatric evaluation/treatments.   Daily notes: Berlynn is seen, chart reviewed. The chart findings discussed with the treatment. She is lying down in bed. She is alert, oriented & aware of situation. She is making a good eye contact & verbally responsive. She reports, "I'm trying to stop these negative thoughts about my family & my boyfriend. I'm mad at my mama because she treats my sisters better than she treats me. She answers their phone call. Since I have been here, she has answered my phone call once or twice & my boyfriend, he won't quit using drugs & going to jail. . My mood is a little hyper today. When I'm hyper, I be singing songs & telling jokes. I did it here last night. I'm not feeling too depressed today, I'm just thinking about going home. Can I go home tomorrow?" The staff reports that patient presented somewhat manic & was telling inappropriate joke while at the Dayroom with the other patients present yesterday evening. Apparently, the jokes were vulgar & sexual in nature. However, during this evaluation, patient presents calm without any distress or behavioral problems. There are no manic symptoms observed. However, the staff states  she starts to display her manic behaviors right around 3:00 pm. Patient seems to be approaching her baseline at this time. Her lithium level is to be obtained tomorrow as the staff continues to monitor her symptoms response to her treatment regimen. Hellena currently denies any SIHI, AVH, delusional thoughts or paranoia. She does not appear to be responding to any internal stimuli. We will continue current plan of care as already in progress.  Collateral information: Call the [facility] act team: [phone] for collateral information about [place]. Spoke with Threasa Beards, took a message. Says she will give one of [place] [facility] staff member a message to call us back.  Principal Problem: Schizoaffective disorder, bipolar type ([place])  Diagnosis: Principal Problem:   Schizoaffective disorder, bipolar type ([place]) Active Problems:   Severe episode of recurrent major depressive disorder, without psychotic features ([place])   Schizophrenia ([place])  Total Time spent with patient:  35 minutes  Past Psychiatric History: See H&P  Past Medical History:  Past Medical History:  Diagnosis Date   Anxiety    Bipolar 1 disorder ([place])    Cognitive deficits    Depression    Diabetes mellitus without complication ([place])    Hypertension    Mental disorder    Mental health disorder    Obesity     Past Surgical History:  Procedure Laterality Date   CESAREAN SECTION     CESAREAN SECTION N/A 04/25/2013   Procedure: REPEAT CESAREAN SECTION;  Surgeon: [name], MD;  Location: [facility];  Service: Obstetrics;  Laterality: N/A;   MASS EXCISION N/A 06/03/2012  Procedure: EXCISION MASS;  Surgeon: [name], MD;  Location: [place];  Service: ENT;  Laterality: N/A;  Excision uvula mass   TONSILLECTOMY N/A 06/03/2012   Procedure: TONSILLECTOMY;  Surgeon: [name], MD;  Location: [facility];  Service: ENT;  Laterality: N/A;   TONSILLECTOMY     Family History:   Family History  Problem Relation Age of Onset   Hypertension Mother    Diabetes Father    Family Psychiatric  History: See H&P.  Social History:  Social History   Substance and Sexual Activity  Alcohol Use Not Currently     Social History   Substance and Sexual Activity  Drug Use Not Currently   Types: "Crack" cocaine, Other-see comments   Comment: Patient reports hx of smoking Crack    Social History   Socioeconomic History   Marital status: Single    Spouse name: Not on file   Number of children: Not on file   Years of education: Not on file   Highest education level: Not on file  Occupational History   Not on file  Tobacco Use   Smoking status: Every Day    Types: Cigars   Smokeless tobacco: Never   Tobacco comments:    Pt declined  Vaping Use   Vaping Use: Never used  Substance and Sexual Activity   Alcohol use: Not Currently   Drug use: Not Currently    Types: "Crack" cocaine, Other-see comments    Comment: Patient reports hx of smoking Crack   Sexual activity: Not Currently    Birth control/protection: None    Comment: occasionally  Other Topics Concern   Not on file  Social History Narrative   ** Merged History Encounter **       Social Determinants of Health   Financial Resource Strain: Not on file  Food Insecurity: No Food Insecurity (04/10/2022)   Hunger Vital Sign    Worried About Running Out of Food in the Last Year: Never true    Ran Out of Food in the Last Year: Never true  Transportation Needs: No Transportation Needs (04/10/2022)   PRAPARE - Hydrologist (Medical): No    Lack of Transportation (Non-Medical): No  Physical Activity: Not on file  Stress: Not on file  Social Connections: Not on file   Additional Social History:   Sleep: Good  Appetite:  Good  Current Medications: Current Facility-Administered Medications  Medication Dose Route Frequency Provider Last Rate Last Admin   acetaminophen  (TYLENOL) tablet 650 mg  650 mg Oral Q6H PRN [name], NP       alum & mag hydroxide-simeth (MAALOX/MYLANTA) 200-200-20 MG/5ML suspension 30 mL  30 mL Oral Q4H PRN [name], NP       amLODipine (NORVASC) tablet 10 mg  10 mg Oral Daily Lindell Spar I, NP   10 mg at 04/13/22 0837   benztropine (COGENTIN) tablet 0.5 mg  0.5 mg Oral BID White, Patrice L, NP   0.5 mg at 04/13/22 6269   cholecalciferol (VITAMIN D3) 25 MCG (1000 UNIT) tablet 1,000 Units  1,000 Units Oral Daily Lindell Spar I, NP   1,000 Units at 04/13/22 0837   gabapentin (NEURONTIN) capsule 100 mg  100 mg Oral TID Lindell Spar I, NP   100 mg at 04/13/22 0837   glipiZIDE (GLUCOTROL XL) 24 hr tablet 10 mg  10 mg Oral Q breakfast Lindell Spar I, NP   10 mg at  04/13/22 0837   haloperidol (HALDOL) tablet 10 mg  10 mg Oral QHS White, Patrice L, NP   10 mg at 04/12/22 2103   insulin aspart (novoLOG) injection 0-5 Units  0-5 Units Subcutaneous QHS White, Patrice L, NP   2 Units at 04/12/22 2103   insulin aspart (novoLOG) injection 0-9 Units  0-9 Units Subcutaneous TID WC White, Patrice L, NP   5 Units at 04/13/22 0700   insulin aspart protamine- aspart (NOVOLOG MIX 70/30) injection 20 Units  20 Units Subcutaneous BID WC Analilia Geddis, Herbert Pun I, NP   20 Units at 04/13/22 0846   lithium carbonate (ESKALITH) ER tablet 450 mg  450 mg Oral QHS Lindell Spar I, NP   450 mg at 04/12/22 2103   magnesium hydroxide (MILK OF MAGNESIA) suspension 30 mL  30 mL Oral Daily PRN [name], NP       metoprolol tartrate (LOPRESSOR) tablet 12.5 mg  12.5 mg Oral BID White, Patrice L, NP   12.5 mg at 04/13/22 0837   pantoprazole (PROTONIX) EC tablet 40 mg  40 mg Oral Daily Aithan Farrelly, Herbert Pun I, NP   40 mg at 04/13/22 0837   simvastatin (ZOCOR) tablet 40 mg  40 mg Oral QHS Akosua Constantine, Herbert Pun I, NP   40 mg at 04/12/22 2103   traZODone (DESYREL) tablet 50 mg  50 mg Oral QHS PRN Bobbitt, Shalon E, NP   50 mg at 04/11/22 2042   venlafaxine XR (EFFEXOR-XR) 24 hr capsule 75 mg  75  mg Oral Q breakfast Lindell Spar I, NP   75 mg at 04/13/22 0865   Lab Results:  Results for orders placed or performed during the hospital encounter of 04/10/22 (from the past 48 hour(s))  Glucose, capillary     Status: Abnormal   Collection Time: 04/11/22  5:30 PM  Result Value Ref Range   Glucose-Capillary 205 (H) 70 - 99 mg/dL    Comment: Glucose reference range applies only to samples taken after fasting for at least 8 hours.  Glucose, capillary     Status: Abnormal   Collection Time: 04/11/22  8:13 PM  Result Value Ref Range   Glucose-Capillary 151 (H) 70 - 99 mg/dL    Comment: Glucose reference range applies only to samples taken after fasting for at least 8 hours.  Glucose, capillary     Status: Abnormal   Collection Time: 04/12/22  5:10 AM  Result Value Ref Range   Glucose-Capillary 333 (H) 70 - 99 mg/dL    Comment: Glucose reference range applies only to samples taken after fasting for at least 8 hours.  Glucose, capillary     Status: Abnormal   Collection Time: 04/12/22 12:13 PM  Result Value Ref Range   Glucose-Capillary 208 (H) 70 - 99 mg/dL    Comment: Glucose reference range applies only to samples taken after fasting for at least 8 hours.  Glucose, capillary     Status: Abnormal   Collection Time: 04/12/22  5:28 PM  Result Value Ref Range   Glucose-Capillary 180 (H) 70 - 99 mg/dL    Comment: Glucose reference range applies only to samples taken after fasting for at least 8 hours.  Glucose, capillary     Status: Abnormal   Collection Time: 04/12/22  7:39 PM  Result Value Ref Range   Glucose-Capillary 204 (H) 70 - 99 mg/dL    Comment: Glucose reference range applies only to samples taken after fasting for at least 8 hours.  Glucose, capillary  Status: Abnormal   Collection Time: 04/13/22  5:05 AM  Result Value Ref Range   Glucose-Capillary 294 (H) 70 - 99 mg/dL    Comment: Glucose reference range applies only to samples taken after fasting for at least 8 hours.   Glucose, capillary     Status: Abnormal   Collection Time: 04/13/22 12:43 PM  Result Value Ref Range   Glucose-Capillary 158 (H) 70 - 99 mg/dL    Comment: Glucose reference range applies only to samples taken after fasting for at least 8 hours.   Blood Alcohol level:  Lab Results  Component Value Date   ETH <10 04/06/2022   ETH <10 27/25/3664   Metabolic Disorder Labs: Lab Results  Component Value Date   HGBA1C 7.6 (H) 01/10/2022   MPG 171.42 01/10/2022   MPG 116.89 09/28/2021   Lab Results  Component Value Date   PROLACTIN 32.1 (H) 05/03/2020   PROLACTIN 5.3 05/19/2019   Lab Results  Component Value Date   CHOL 159 09/28/2021   TRIG 91 09/28/2021   HDL 51 09/28/2021   CHOLHDL 3.1 09/28/2021   VLDL 18 09/28/2021   LDLCALC 90 09/28/2021   LDLCALC 109 (H) 03/22/2021   Physical Findings: AIMS: Facial and Oral Movements Muscles of Facial Expression: None, normal Lips and Perioral Area: None, normal Jaw: None, normal Tongue: None, normal,Extremity Movements Upper (arms, wrists, hands, fingers): None, normal Lower (legs, knees, ankles, toes): None, normal, Trunk Movements Neck, shoulders, hips: None, normal, Overall Severity Severity of abnormal movements (highest score from questions above): None, normal Incapacitation due to abnormal movements: None, normal Patient's awareness of abnormal movements (rate only patient's report): No Awareness, Dental Status Current problems with teeth and/or dentures?: No Does patient usually wear dentures?: No  CIWA:    COWS:     Musculoskeletal: Strength & Muscle Tone: within normal limits Gait & Station: normal Patient leans: N/A  Psychiatric Specialty Exam:  Presentation  General Appearance:  Appropriate for Environment; Casual; Fairly Groomed  Eye Contact: Good  Speech: Normal Rate; Clear and Coherent  Speech Volume: Normal  Handedness: Right  Mood and Affect  Mood: -- ("Improving")  Affect: Appropriate;  Congruent   Thought Process  Thought Processes: Coherent  Descriptions of Associations:Intact  Orientation:Full (Time, Place and Person)  Thought Content:Logical  History of Schizophrenia/Schizoaffective disorder:Yes  Duration of Psychotic Symptoms:Greater than six months  Hallucinations:Hallucinations: None   Ideas of Reference:None  Suicidal Thoughts:Suicidal Thoughts: No SI Active Intent and/or Plan: Without Intent; Without Plan; Without Means to Carry Out; Without Access to Means SI Passive Intent and/or Plan: Without Intent; Without Plan; Without Means to Carry Out; Without Access to Means   Homicidal Thoughts:Homicidal Thoughts: No   Sensorium  Memory: Immediate Good; Recent Good; Remote Good  Judgment: Fair  Insight: Fair   Community education officer  Concentration: Good  Attention Span: Good  Recall: Good  Fund of Knowledge: Good  Language: Good  Psychomotor Activity  Psychomotor Activity: Psychomotor Activity: Normal AIMS Completed?: No   Assets  Assets: Communication Skills; Desire for Improvement; Financial Resources/Insurance; Housing; Physical Health; Resilience; Social Support  Sleep  Sleep: Sleep: Good Number of Hours of Sleep: 6.25   Physical Exam: Physical Exam Vitals and nursing note reviewed.  HENT:     Nose: Nose normal.     Mouth/Throat:     Pharynx: Oropharynx is clear.  Cardiovascular:     Rate and Rhythm: Normal rate.     Pulses: Normal pulses.  Pulmonary:     Effort:  Pulmonary effort is normal.  Genitourinary:    Comments: Deferred Musculoskeletal:        General: Normal range of motion.     Cervical back: Normal range of motion.  Skin:    General: Skin is warm and dry.  Neurological:     General: No focal deficit present.     Mental Status: She is alert and oriented to person, place, and time.    Review of Systems  Constitutional:  Negative for chills, diaphoresis and fever.  HENT:  Negative for  congestion and sore throat.   Respiratory:  Negative for cough, shortness of breath and wheezing.   Cardiovascular:  Negative for chest pain and palpitations.  Gastrointestinal:  Negative for abdominal pain, constipation, diarrhea, heartburn, nausea and vomiting.  Musculoskeletal:  Negative for joint pain and myalgias.  Skin:  Negative for itching and rash.  Neurological:  Negative for dizziness, tingling, tremors, sensory change, speech change, focal weakness, seizures, loss of consciousness, weakness and headaches.  Endo/Heme/Allergies:        See allergy lists.  Psychiatric/Behavioral:  Positive for depression. Negative for hallucinations, memory loss, substance abuse and suicidal ideas. The patient is not nervous/anxious and does not have insomnia.    Blood pressure (!) 125/98, pulse 98, temperature 98.1 F (36.7 C), temperature source Oral, resp. rate 20, height '5\' 7"'$  (1.702 m), weight 51.7 kg, last menstrual period 03/05/2022, SpO2 99 %. Body mass index is 17.85 kg/m.  Treatment Plan Summary: Daily contact with patient to assess and evaluate symptoms and progress in treatment and Medication management.   Continue inpatient hospitalization.  Will continue today 04/13/2022 plan as below except where it is noted.   Diagnoses. 1. Schizoaffective disorder, bipolar-type. 2. Schizophrenia. 3. Major depressive disorder, recurrent episodes.  Plan: Continue Haldol 10 mg po Q bedtime for psychosis.  Continue benztropine 0.5 mg po bid for eps prevention.  Continue gabapentin 100 mg po tid for agitation/anxiety. Continue Lithium carbonate 450 mg po Q bedtime for mood stabilization. Continue Effexor XR 75 mg po daily for depression. Continue Trazodone 50 mg po Q bedtime prn for insomnia. Lithium level: 04-14-22 am. Result pending.   Other medical issues. Continue amlodipine 10 mg po daily for HTN.  Continue Vitamin D3 1 tablet po daily for bone health.  Continue Glucotrol 10 mg po q  daily for diabetes mellitus. Continue Metoprolol 12.5 mg po bid for HTN.  Continue Protonix 40 mg po Q am for GERD.  Continue Simvastatin 40 mg po Q evenings for high cholesterol.  Continue the sliding scale insulin as ordered prn based on blood sugar levels.   Other PRNS -Continue Tylenol 650 mg every 6 hours PRN for mild pain -Continue Maalox 30 ml Q 4 hrs PRN for indigestion -Continue MOM 30 ml po Q 6 hrs for constipation   Safety and Monitoring: Voluntary admission to inpatient psychiatric unit for safety, stabilization and treatment Daily contact with patient to assess and evaluate symptoms and progress in treatment Patient's case to be discussed in multi-disciplinary team meeting Observation Level : q15 minute checks Vital signs: q12 hours Precautions: Safety   Discharge Planning: Social work and case management to assist with discharge planning and identification of hospital follow-up needs prior to discharge Estimated LOS: 5-7 days Discharge Concerns: Need to establish a safety plan; Medication compliance and effectiveness Discharge Goals: Return home with outpatient referrals for mental health follow-up including medication management/psychotherapy  Lindell Spar, NP, pmhnp, fnp-bc 04/13/2022, 1:16 PMPatient ID: [name], female  DOB: 1991-02-08, 31 y.o.   MRN: [id number]

## 2022-04-14 DIAGNOSIS — F250 Schizoaffective disorder, bipolar type: Principal | ICD-10-CM

## 2022-04-14 LAB — GLUCOSE, CAPILLARY
Glucose-Capillary: 292 mg/dL — ABNORMAL HIGH (ref 70–99)
Glucose-Capillary: 214 mg/dL — ABNORMAL HIGH (ref 70–99)

## 2022-04-14 LAB — LITHIUM LEVEL: Lithium Lvl: 0.17 mmol/L — ABNORMAL LOW (ref 0.60–1.20)

## 2022-04-14 MED ORDER — HALOPERIDOL 10 MG PO TABS: 10.0000 mg | ORAL_TABLET | Freq: Every day | ORAL | 0 refills | Status: DC

## 2022-04-14 MED ORDER — VENLAFAXINE HCL ER 75 MG PO CP24: 75.0000 mg | ORAL_CAPSULE | Freq: Every day | ORAL | 0 refills | Status: DC

## 2022-04-14 MED ORDER — TRAZODONE HCL 50 MG PO TABS: 50.0000 mg | ORAL_TABLET | Freq: Every evening | ORAL | 0 refills | Status: DC | PRN

## 2022-04-14 MED ORDER — GABAPENTIN 100 MG PO CAPS: 100.0000 mg | ORAL_CAPSULE | Freq: Three times a day (TID) | ORAL | 0 refills | Status: DC

## 2022-04-14 MED ORDER — BENZTROPINE MESYLATE 0.5 MG PO TABS: 0.5000 mg | ORAL_TABLET | Freq: Two times a day (BID) | ORAL | 0 refills | Status: DC

## 2022-04-14 MED ORDER — LITHIUM CARBONATE ER 450 MG PO TBCR: 450.0000 mg | EXTENDED_RELEASE_TABLET | Freq: Every day | ORAL | 0 refills | Status: DC

## 2022-04-14 NOTE — [facility] Suicide Risk Assessment (Signed)
[facility] INPATIENT:  Family/Significant Other Suicide Prevention Education  Suicide Prevention Education:  Education Completed; Pamala Hurry, [phone], mother  (name of family member/significant other) has been identified by the patient as the family member/significant other with whom the patient will be residing, and identified as the person(s) who will aid the patient in the event of a mental health crisis (suicidal ideations/suicide attempt).  With written consent from the patient, the family member/significant other has been provided the following suicide prevention education, prior to the and/or following the discharge of the patient.  No safety concerns, patient is ready for discharge.  The suicide prevention education provided includes the following: Suicide risk factors Suicide prevention and interventions National Suicide Hotline telephone number [facility] - Closed assessment telephone number [facility] Emergency Assistance [place] and/or Residential Mobile Crisis Unit telephone number  Request made of family/significant other to: Remove weapons (e.g., guns, rifles, knives), all items previously/currently identified as safety concern.   Remove drugs/medications (over-the-counter, prescriptions, illicit drugs), all items previously/currently identified as a safety concern.  The family member/significant other verbalizes understanding of the suicide prevention education information provided.  The family member/significant other agrees to remove the items of safety concern listed above.  Rufina Kimery E Zenna Traister 04/14/2022, 11:06 AM

## 2022-04-14 NOTE — Group Note (Signed)
Recreation Therapy Group Note   Group Topic:Health and Wellness  Group Date: 04/14/2022 Start Time: 1000 End Time: 1030 Facilitators: [name], LRT,CTRS Location: [address] Dayroom   Goal Area(s) Addresses:  Patient will verbalize benefit of exercise during group session. Patient will verbalize an exercise that can be completed in their hospital room during admission. Patient will identify an exercise that can be completed post d/c. Patient will acknowledge benefits of exercise when used as a coping mechanism.   Group Description:  Exercise.  LRT and patients talked about the importance of exercise and how it work together with the other aspects of wellness (mental and spiritual).  LRT then explained to patients they would engage in 30 minutes of exercise.  Each patient would take turns leading the group in the exercises of their choosing.  Patients were encouraged to take breaks and drink water as needed.  Patients were to make to most of the activity to increase heart rate and feel more energized.   Affect/Mood: N/A   Participation Level: Did not attend    Clinical Observations/Individualized Feedback:      Plan: Continue to engage patient in RT group sessions 2-3x/week.   [name], LRT,CTRS 04/14/2022 11:04 AM

## 2022-04-14 NOTE — Progress Notes (Signed)
   04/14/22 0545  Sleep  Number of Hours 7

## 2022-04-14 NOTE — Inpatient Diabetes Management (Signed)
  Inpatient Diabetes Program Recommendations  AACE/ADA: New Consensus Statement on Inpatient Glycemic Control (2015)  Target Ranges:  Prepandial:   less than 140 mg/dL      Peak postprandial:   less than 180 mg/dL (1-2 hours)      Critically ill patients:  140 - 180 mg/dL   Lab Results  Component Value Date   GLUCAP 214 (H) 04/14/2022   HGBA1C 7.6 (H) 01/10/2022    Review of Glycemic Control  Diabetes history: DM2 Outpatient Diabetes medications: Amaryl 4 mg with breakfast, glipizide 10 mg with breakfast, Novolog 70/30 20 units BID Current orders for Inpatient glycemic control: Novolog 70/30 20 units BID, Novolog 0-9 units TID with meals and 0-5 HS, glipizide 10 mg with breakfast  HgbA1C - 7.6% on 01/10/22  Inpatient Diabetes Program Recommendations:    Consider increasing 70/30 to 24 units BID  Needs updated HgbA1C  F/U with PCP for diabetes management  Thank you. Lorenda Peck, RD, LDN, [place] Inpatient Diabetes Coordinator [phone]

## 2022-04-14 NOTE — Discharge Summary (Signed)
Physician Discharge Summary Note  Patient:  [name] is an 31 y.o., female  MRN:  [id number]  DOB:  1991/03/14  Patient phone:  [phone] (home)   Patient address:   [address] [place] [address],  Total Time spent with patient:  Greater than 30 minutes.  Date of Admission:  04/10/2022  Date of Discharge: 04-14-22.  Reason for Admission: Worsening symptoms of depression & suicidal ideations with plans to cut herself or overdose on medications.  Principal Problem: Schizoaffective disorder, bipolar type [facility])  Discharge Diagnoses: Principal Problem:   Schizoaffective disorder, bipolar type ([place]) Active Problems:   Severe episode of recurrent major depressive disorder, without psychotic features ([place])   Schizophrenia ([place])  Past Psychiatric History: Schizoaffective disorder, bipolar-type.  Past Medical History:  Past Medical History:  Diagnosis Date   Anxiety    Bipolar 1 disorder ([employer])    Cognitive deficits    Depression    Diabetes mellitus without complication ([place])    Hypertension    Mental disorder    Mental health disorder    Obesity     Past Surgical History:  Procedure Laterality Date   CESAREAN SECTION     CESAREAN SECTION N/A 04/25/2013   Procedure: REPEAT CESAREAN SECTION;  Surgeon: [name], MD;  Location: [facility];  Service: Obstetrics;  Laterality: N/A;   MASS EXCISION N/A 06/03/2012   Procedure: EXCISION MASS;  Surgeon: [name], MD;  Location: [place];  Service: ENT;  Laterality: N/A;  Excision uvula mass   TONSILLECTOMY N/A 06/03/2012   Procedure: TONSILLECTOMY;  Surgeon: [name], MD;  Location: [place];  Service: ENT;  Laterality: N/A;   TONSILLECTOMY     Family History:  Family History  Problem Relation Age of Onset   Hypertension Mother    Diabetes Father    Family Psychiatric  History: See H&P.  Social History:  Social History   Substance and Sexual Activity   Alcohol Use Not Currently     Social History   Substance and Sexual Activity  Drug Use Not Currently   Types: "Crack" cocaine, Other-see comments   Comment: Patient reports hx of smoking Crack    Social History   Socioeconomic History   Marital status: Single    Spouse name: Not on file   Number of children: Not on file   Years of education: Not on file   Highest education level: Not on file  Occupational History   Not on file  Tobacco Use   Smoking status: Every Day    Types: Cigars   Smokeless tobacco: Never   Tobacco comments:    Pt declined  Vaping Use   Vaping Use: Never used  Substance and Sexual Activity   Alcohol use: Not Currently   Drug use: Not Currently    Types: "Crack" cocaine, Other-see comments    Comment: Patient reports hx of smoking Crack   Sexual activity: Not Currently    Birth control/protection: None    Comment: occasionally  Other Topics Concern   Not on file  Social History Narrative   ** Merged History Encounter **       Social Determinants of Health   Financial Resource Strain: Not on file  Food Insecurity: No Food Insecurity (04/10/2022)   Hunger Vital Sign    Worried About Running Out of Food in the Last Year: Never true    Ran Out of Food in the Last Year: Never true  Transportation  Needs: No Transportation Needs (04/10/2022)   PRAPARE - Hydrologist (Medical): No    Lack of Transportation (Non-Medical): No  Physical Activity: Not on file  Stress: Not on file  Social Connections: Not on file   Hospital Course: (Per admission evaluation notes): 31 year old [name] with hx of Schizophrenia, chronic, Schizoaffective disorder, bipolar-type, chronic & major depressive disorder, recurrent episodes. Patient is well know in this hospital & other [place] systems from her previous admissions for mood stabilization treatments. Prior to this admission, she was receiving mental health care on an outpatient  basis. She is linked with the [facility] with Dr. Alonza Smoker as the medical director. Patient was brought to [facility] this time around from the [facility] with complain of worsening symptoms of depression & suicidal ideations with plans to cut herself or overdose on medications. She was in need of further psychiatric evaluation/treatments.   Prior to this discharge, [name] was seen & evaluated for mental health stability. The current laboratory findings were reviewed (stable), nurses notes & vital signs were reviewed as well. There are no current mental health or medical issues that should prevent this discharge at this time. Patient is being discharged to continue mental health care/medication management as noted below with her Act team.   After the above admission evaluation, [name]'s presenting symptoms were noted. She was recommended for mood stabilization treatments by resumption of her previous treatment regimen. Her Lithium level on admission per lab result review was very low (0.06). It appeared that she was not adhering to taking her medications as recommended. She denies any having any issues such as side effects or adverse reactions from her treatment regimen that led to her not taking them. She was resumed, stabilized & discharged on the medications as listed on her discharge medication lists below. Besides the mood stabilization treatments, [name] was also enrolled & participated in the group counseling sessions being offered & held on this unit. She learned coping skills. She presented other significant pre-existing medical issues that required treatment. She was treated & discharged on the medications used for those medical issues. She tolerated her treatment regimen without any adverse effects or reactions reported. [name]'s symptoms responded well to her treatment regimen warranting this discharge. Patient is also mentally/medically stable & agreeable to this discharge.  During the course  of this hospitalization, [name] presented no instances of behavioral issues that required restraints or immediate intervention. Patient remained safe on the unit. There were no instances of self-harming behaviors. There were no threats to other patients/staff. [name] remained cooperative to her daily routines & in taking her treatment regimen as recommended by her treatment team. She participated in the group sessions and interacted with staff and other patients appropriately. However, she did at times display some inappropriate/crude sexual jokes. However, she was easily redirected.  During the course of this hospitalization, patient's symptoms responded well to her treatment regimen & her mood has improved. [name] currently presents mentally & medically stable to be discharged to continue mental health care & medication management on an outpatient as noted below with her Act team. At this time of her hospital discharge, She presents alert, attentive, well related, pleasant, mood improved & currently presents euthymic. Her affect is appropriate & positively reactive, no thought disorder noted, no suicidal or self injurious ideations reported, no homicidal or violent ideations present, no hallucinations, no delusions, not internally preoccupied. She is future oriented. She denies any medication side effects, which  we reviewed with her. She will continue further mental health care & medication management on an outpatient basis as noted below. She is provided with all the necessary information needed to make this appointment without problems. [name] was able to engage in safety planning including plan to return to [facility] or contact emergency services if she feels unable to maintain her own safety or the safety of others. Pt had no further questions, comments or concerns.  She left [facility] in no apparent distress with all personal belongings. Transportation per taxi cab. [place] assisted with Taxi fare.       Physical  Findings: AIMS: Facial and Oral Movements Muscles of Facial Expression: None, normal Lips and Perioral Area: None, normal Jaw: None, normal Tongue: None, normal,Extremity Movements Upper (arms, wrists, hands, fingers): None, normal Lower (legs, knees, ankles, toes): None, normal, Trunk Movements Neck, shoulders, hips: None, normal, Overall Severity Severity of abnormal movements (highest score from questions above): None, normal Incapacitation due to abnormal movements: None, normal Patient's awareness of abnormal movements (rate only patient's report): No Awareness, Dental Status Current problems with teeth and/or dentures?: No Does patient usually wear dentures?: No  CIWA:    COWS:     Musculoskeletal: Strength & Muscle Tone: within normal limits Gait & Station: normal Patient leans: N/A  Psychiatric Specialty Exam:  Presentation  General Appearance:  Appropriate for Environment; Casual; Fairly Groomed  Eye Contact: Good  Speech: Normal Rate; Clear and Coherent  Speech Volume: Normal  Handedness: Right  Mood and Affect  Mood: -- ("Improving")  Affect: Appropriate; Congruent   Thought Process  Thought Processes: Coherent  Descriptions of Associations:Intact  Orientation:Full (Time, Place and Person)  Thought Content:Logical  History of Schizophrenia/Schizoaffective disorder:Yes  Duration of Psychotic Symptoms:Greater than six months  Hallucinations:Hallucinations: None  Ideas of Reference:None  Suicidal Thoughts:Suicidal Thoughts: No SI Active Intent and/or Plan: Without Intent; Without Plan; Without Means to Carry Out; Without Access to Means SI Passive Intent and/or Plan: Without Intent; Without Plan; Without Means to Carry Out; Without Access to Means  Homicidal Thoughts:Homicidal Thoughts: No  Sensorium  Memory: Immediate Good; Recent Good; Remote Good  Judgment: Fair  Insight: Fair  Community education officer   Concentration: Good  Attention Span: Good  Recall: Good  Fund of Knowledge: Good  Language: Good  Psychomotor Activity  Psychomotor Activity: Psychomotor Activity: Normal AIMS Completed?: No  Assets  Assets: Communication Skills; Desire for Improvement; Financial Resources/Insurance; Housing; Physical Health; Resilience; Social Support  Sleep  Sleep: Sleep: Good Number of Hours of Sleep: 6.25  Physical Exam: Physical Exam Vitals and nursing note reviewed.  HENT:     Nose: Nose normal.     Mouth/Throat:     Pharynx: Oropharynx is clear.  Cardiovascular:     Rate and Rhythm: Normal rate.     Pulses: Normal pulses.     Heart sounds: Normal heart sounds.  Pulmonary:     Effort: Pulmonary effort is normal.  Genitourinary:    Comments: Deferred Musculoskeletal:        General: Normal range of motion.     Cervical back: Normal range of motion.  Skin:    General: Skin is warm and dry.  Neurological:     General: No focal deficit present.     Mental Status: She is alert and oriented to person, place, and time. Mental status is at baseline.    Review of Systems  Constitutional:  Negative for chills, diaphoresis and fever.  HENT:  Negative for congestion and sore  throat.   Respiratory:  Negative for cough, shortness of breath and wheezing.   Cardiovascular:  Negative for chest pain and palpitations.  Gastrointestinal:  Negative for abdominal pain, constipation, diarrhea, heartburn, nausea and vomiting.  Genitourinary:  Negative for dysuria.  Musculoskeletal:  Negative for joint pain and myalgias.  Neurological:  Negative for dizziness, tingling, tremors, sensory change, speech change, focal weakness, seizures, loss of consciousness, weakness and headaches.  Endo/Heme/Allergies:        See the allergy lists.  Psychiatric/Behavioral:  Positive for depression (Hx of (stable on medication).). Negative for hallucinations, memory loss, substance abuse and suicidal  ideas. The patient is not nervous/anxious and does not have insomnia.    Blood pressure 116/88, pulse 81, temperature 97.8 F (36.6 C), temperature source Oral, resp. rate 18, height '5\' 7"'$  (1.702 m), weight 51.7 kg, last menstrual period 03/05/2022, SpO2 99 %. Body mass index is 17.85 kg/m.   Social History   Tobacco Use  Smoking Status Every Day   Types: Cigars  Smokeless Tobacco Never  Tobacco Comments   Pt declined   Tobacco Cessation:  N/A, patient does not currently use tobacco products  Blood Alcohol level:  Lab Results  Component Value Date   ETH <10 04/06/2022   ETH <10 63/87/5643   Metabolic Disorder Labs:  Lab Results  Component Value Date   HGBA1C 7.6 (H) 01/10/2022   MPG 171.42 01/10/2022   MPG 116.89 09/28/2021   Lab Results  Component Value Date   PROLACTIN 32.1 (H) 05/03/2020   PROLACTIN 5.3 05/19/2019   Lab Results  Component Value Date   CHOL 159 09/28/2021   TRIG 91 09/28/2021   HDL 51 09/28/2021   CHOLHDL 3.1 09/28/2021   VLDL 18 09/28/2021   LDLCALC 90 09/28/2021   LDLCALC 109 (H) 03/22/2021   See Psychiatric Specialty Exam and Suicide Risk Assessment completed by Attending Physician prior to discharge.  Discharge destination:  Home  Is patient on multiple antipsychotic therapies at discharge:  No   Has Patient had three or more failed trials of antipsychotic monotherapy by history:  No  Recommended Plan for Multiple Antipsychotic Therapies: NA   Allergies as of 04/14/2022       Reactions   Wellbutrin [bupropion] Shortness Of Breath   Omnipaque [iohexol] Swelling, Other (See Comments)   Eye swelling   Penicillins Hives, Other (See Comments)   Has patient had a PCN reaction causing immediate rash, facial/tongue/throat swelling, SOB or lightheadedness with hypotension: Unknown Has patient had a PCN reaction causing severe rash involving mucus membranes or skin necrosis: Yes Has patient had a PCN reaction that required hospitalization  Unknown Has patient had a PCN reaction occurring within the last 10 years: No If all of the above answers are "NO", then may proceed with Cephalosporin use.   Atarax [hydroxyzine] Other (See Comments)   Causes hyperactivity, makes pt want to "fight"   Contrast Media [iodinated Contrast Media] Other (See Comments)   Eyes swell up   Cymbalta [duloxetine Hcl] Other (See Comments)   No appetite and makes the patient "act up"        Medication List     STOP taking these medications    glimepiride 4 MG tablet Commonly known as: AMARYL   hydrocortisone 2.5 % ointment   nystatin cream Commonly known as: MYCOSTATIN   venlafaxine 75 MG tablet Commonly known as: EFFEXOR Replaced by: venlafaxine XR 75 MG 24 hr capsule       TAKE these medications  Indication  amLODipine 10 MG tablet Commonly known as: NORVASC Take 10 mg by mouth daily.  Indication: High Blood Pressure Disorder   benztropine 0.5 MG tablet Commonly known as: COGENTIN Take 1 tablet (0.5 mg total) by mouth 2 (two) times daily. For eps What changed:  medication strength how much to take additional instructions  Indication: Extrapyramidal Reaction caused by Medications   gabapentin 100 MG capsule Commonly known as: NEURONTIN Take 1 capsule (100 mg total) by mouth 3 (three) times daily. For agitation What changed: additional instructions  Indication: Agitation   glipiZIDE 10 MG 24 hr tablet Commonly known as: GLUCOTROL XL Take 10 mg by mouth daily with breakfast.  Indication: Type 2 Diabetes   haloperidol 10 MG tablet Commonly known as: HALDOL Take 1 tablet (10 mg total) by mouth at bedtime. For mood stabilization What changed:  when to take this additional instructions  Indication: Mood stabilization   insulin aspart protamine- aspart (70-30) 100 UNIT/ML injection Commonly known as: NOVOLOG MIX 70/30 Inject 20 Units into the skin 2 (two) times daily with a meal.  Indication: Type 2 Diabetes    lithium carbonate 450 MG ER tablet Commonly known as: ESKALITH Take 1 tablet (450 mg total) by mouth at bedtime. For mood stabilization What changed: additional instructions  Indication: Mood stabilization.   metoprolol tartrate 25 MG tablet Commonly known as: LOPRESSOR Take 12.5 mg by mouth 2 (two) times daily.  Indication: High Blood Pressure Disorder   omeprazole 20 MG capsule Commonly known as: PRILOSEC Take 20 mg by mouth daily.  Indication: Gastroesophageal Reflux Disease   simvastatin 40 MG tablet Commonly known as: ZOCOR Take 40 mg by mouth at bedtime.  Indication: High Amount of Fats in the Blood, High Amount of Triglycerides in the Blood   traZODone 50 MG tablet Commonly known as: DESYREL Take 1 tablet (50 mg total) by mouth at bedtime as needed for sleep.  Indication: Trouble Sleeping   venlafaxine XR 75 MG 24 hr capsule Commonly known as: EFFEXOR-XR Take 1 capsule (75 mg total) by mouth daily with breakfast. For depression Start taking on: April 15, 2022 Replaces: venlafaxine 75 MG tablet  Indication: Major Depressive Disorder   Vitamin D3 25 MCG (1000 UT) Caps Take 1,000 Units by mouth daily.  Indication: Vitamin D Deficiency        Follow-up Information     [facility] Call.   Why: Please continue to follow up with your ACTT for ongoing follow up for mental health services.  We encourage you to follow recommendations from your team. Contact information: [address] [place] [address] [phone]                Follow-up recommendations: Activity:  As tolerated Diet: As recommended by your primary care doctor. Keep all scheduled follow-up appointments as recommended.   Comments: Comments: Patient is recommended to follow-up care on an outpatient basis as noted above. Prescriptions sent to pt's pharmacy of choice at discharge.   Patient agreeable to plan.   Given opportunity to ask questions.   Appears  to feel comfortable with discharge denies any current suicidal or homicidal thought. Patient is also instructed prior to discharge to: Take all medications as prescribed by his/her mental healthcare provider. Report any adverse effects and or reactions from the medicines to his/her outpatient provider promptly. Patient has been instructed & cautioned: To not engage in alcohol and or illegal drug use while on prescription medicines. In the event of  worsening symptoms, patient is instructed to call the crisis hotline, 911 and or go to the nearest ED for appropriate evaluation and treatment of symptoms. To follow-up with his/her primary care provider for your other medical issues, concerns and or health care needs.  Signed: Lindell Spar, NP, pmhnp, fnp-bc. 04/14/2022, 12:44 PM

## 2022-04-14 NOTE — Progress Notes (Signed)
  [facility] Adult Case Management Discharge Plan :  Will you be returning to the same living situation after discharge:  Yes,  home At discharge, do you have transportation home?: Yes,  hospital providing taxi Do you have the ability to pay for your medications: Yes,  insurance  Release of information consent forms completed and in the chart;  Patient's signature needed at discharge.  Patient to Follow up at:  Follow-up Information     [facility] Call.   Why: Please continue to follow up with your ACTT for ongoing follow up for mental health services.  We encourage you to follow recommendations from your team. Contact information: [address] [place] [facility] [address] [phone]                 Next level of care provider has access to [place] and Suicide Prevention discussed: Yes,  with mother, Pamala Hurry     Has patient been referred to the Quitline?: Patient refused referral  Patient has been referred for addiction treatment: [place], LCSW 04/14/2022, 1:50 PM

## 2022-04-14 NOTE — Progress Notes (Signed)
D: Pt A & O X 3. Denies SI, HI, AVH and pain at this time. D/C home as ordered. Taxi voucher given for transportation. A: D/C instructions reviewed with pt including prescriptions and follow up appointment with [facility] ([facility]); compliance encouraged. All belongings from locker 16 returned to pt at time of departure. Scheduled medications given with verbal education and effects monitored. Safety checks maintained without incident till time of d/c.  R: Pt receptive to care. Compliant with medications when offered. Denies adverse drug reactions when assessed. Verbalized understanding related to d/c instructions. Signed belonging sheet in agreement with items received from locker. Ambulatory with a steady gait. Appears to be in no physical distress at time of departure.

## 2022-04-14 NOTE — [facility] Suicide Risk Assessment (Signed)
Suicide Risk Assessment  Discharge Assessment    [facility] Discharge Suicide Risk Assessment   Principal Problem: Schizoaffective disorder, bipolar type [facility])  Discharge Diagnoses: Principal Problem:   Schizoaffective disorder, bipolar type ([place]) Active Problems:   Severe episode of recurrent major depressive disorder, without psychotic features ([place])   Schizophrenia ([place])  Total Time spent with patient:  Greater than 30 minutes  Musculoskeletal: Strength & Muscle Tone: within normal limits Gait & Station: normal Patient leans: N/A  Psychiatric Specialty Exam  Presentation  General Appearance:  Appropriate for Environment; Casual; Fairly Groomed  Eye Contact: Good  Speech: Normal Rate; Clear and Coherent  Speech Volume: Normal  Handedness: Right   Mood and Affect  Mood: -- ("Improving")  Duration of Depression Symptoms: Less than two weeks  Affect: Appropriate; Congruent   Thought Process  Thought Processes: Coherent  Descriptions of Associations:Intact  Orientation:Full (Time, Place and Person)  Thought Content:Logical  History of Schizophrenia/Schizoaffective disorder:Yes  Duration of Psychotic Symptoms:Greater than six months  Hallucinations:Hallucinations: None  Ideas of Reference:None  Suicidal Thoughts:Suicidal Thoughts: No SI Active Intent and/or Plan: Without Intent; Without Plan; Without Means to Carry Out; Without Access to Means SI Passive Intent and/or Plan: Without Intent; Without Plan; Without Means to Carry Out; Without Access to Means  Homicidal Thoughts:Homicidal Thoughts: No   Sensorium  Memory: Immediate Good; Recent Good; Remote Good  Judgment: Fair  Insight: Fair   Community education officer  Concentration: Good  Attention Span: Good  Recall: Good  Fund of Knowledge: Good  Language: Good   Psychomotor Activity  Psychomotor Activity: Psychomotor Activity: Normal AIMS Completed?: No   Assets   Assets: Communication Skills; Desire for Improvement; Financial Resources/Insurance; Housing; Physical Health; Resilience; Social Support   Sleep  Sleep: Sleep: Good Number of Hours of Sleep: 6.25   Physical Exam: See H&P.  Blood pressure 116/88, pulse 81, temperature 97.8 F (36.6 C), temperature source Oral, resp. rate 18, height '5\' 7"'$  (1.702 m), weight 51.7 kg, last menstrual period 03/05/2022, SpO2 99 %. Body mass index is 17.85 kg/m.  Mental Status Per Nursing Assessment::   On Admission:  Suicidal ideation indicated by patient  Demographic Factors:  Adolescent or young adult, Low socioeconomic status, Living alone, and Unemployed  Loss Factors: Financial problems/change in socioeconomic status  Historical Factors: Impulsivity  Risk Reduction Factors:   Sense of responsibility to family, Positive social support, Positive therapeutic relationship, and Positive coping skills or problem solving skills  Continued Clinical Symptoms:  Bipolar Disorder:   Mixed State Schizophrenia:   Paranoid or undifferentiated type Previous Psychiatric Diagnoses and Treatments  Cognitive Features That Contribute To Risk:  Closed-mindedness, Loss of executive function, Polarized thinking, and Thought constriction (tunnel vision)    Suicide Risk:  Minimal: No identifiable suicidal ideation.  Patients presenting with no risk factors but with morbid ruminations; may be classified as minimal risk based on the severity of the depressive symptoms   Follow-up Information     [facility] Call.   Why: Please continue to follow up with your ACTT for ongoing follow up for mental health services.  We encourage you to follow recommendations from your team. Contact information: [address] [place] [address] [phone]                Plan Of Care/Follow-up recommendations:  See discharge recommendation above.  Lindell Spar, NP, pmhnp,  fnp-bc. 04/14/2022, 12:31 PM

## 2022-04-15 ENCOUNTER — Encounter (HOSPITAL_BASED_OUTPATIENT_CLINIC_OR_DEPARTMENT_OTHER): Payer: Self-pay

## 2022-04-15 ENCOUNTER — Other Ambulatory Visit: Payer: Self-pay

## 2022-04-15 DIAGNOSIS — Z794 Long term (current) use of insulin: Secondary | ICD-10-CM | POA: Insufficient documentation

## 2022-04-15 DIAGNOSIS — I10 Essential (primary) hypertension: Secondary | ICD-10-CM | POA: Insufficient documentation

## 2022-04-15 DIAGNOSIS — Z79899 Other long term (current) drug therapy: Secondary | ICD-10-CM | POA: Diagnosis not present

## 2022-04-15 DIAGNOSIS — R112 Nausea with vomiting, unspecified: Secondary | ICD-10-CM | POA: Diagnosis present

## 2022-04-15 DIAGNOSIS — Z1152 Encounter for screening for COVID-19: Secondary | ICD-10-CM | POA: Diagnosis not present

## 2022-04-15 DIAGNOSIS — E119 Type 2 diabetes mellitus without complications: Secondary | ICD-10-CM | POA: Insufficient documentation

## 2022-04-15 DIAGNOSIS — Z7984 Long term (current) use of oral hypoglycemic drugs: Secondary | ICD-10-CM | POA: Insufficient documentation

## 2022-04-15 LAB — RESP PANEL BY RT-PCR (RSV, FLU A&B, COVID)  RVPGX2
Influenza A by PCR: NEGATIVE
Influenza B by PCR: NEGATIVE
Resp Syncytial Virus by PCR: NEGATIVE
SARS Coronavirus 2 by RT PCR: NEGATIVE

## 2022-04-15 NOTE — ED Triage Notes (Addendum)
Patient BIB [facility] from Home.  Endorses N/V for approximately 12 Hours. States she feels congested and is concerned she contracted COVID-19 during her Hospital Admission recently.  NAD Noted during Triage. A&Ox4. GCS 15. Ambulatory. VSS with [facility].

## 2022-04-16 ENCOUNTER — Emergency Department (HOSPITAL_BASED_OUTPATIENT_CLINIC_OR_DEPARTMENT_OTHER)
Admission: EM | Admit: 2022-04-16 | Discharge: 2022-04-16 | Disposition: A | Discharge status: Home / Self Care | Payer: Medicaid Other | Attending: Emergency Medicine | Admitting: Emergency Medicine

## 2022-04-16 DIAGNOSIS — R112 Nausea with vomiting, unspecified: Secondary | ICD-10-CM

## 2022-04-16 MED ORDER — ONDANSETRON 8 MG PO TBDP: ORAL_TABLET | ORAL | 0 refills | Status: DC

## 2022-04-16 MED ORDER — ONDANSETRON 4 MG PO TBDP
8.0000 mg | ORAL_TABLET | Freq: Once | ORAL | Status: AC
  Administered 2022-04-16: 8 mg via ORAL
  Filled 2022-04-16: qty 2

## 2022-04-16 NOTE — ED Notes (Signed)
Pt ambulatory from waiting room to exam room escorted by charge nurse.  No acute distress noted.

## 2022-04-16 NOTE — ED Notes (Signed)
Pt agreeable with d/c plan as discussed by provider- this nurse has verbally reinforced d/c instructions and provided pt with written copy.  Pt acknowledges verbal understanding and denies any additional questions, concerns, needs- pt ambulatory independently to waiting room to await ride home; gait steady; no distress.

## 2022-04-16 NOTE — ED Provider Notes (Signed)
[place] EM[facility] DEPT Provider Note   CSN: [id number] Arrival date & time: 04/15/22  2052     History  Chief Complaint  Patient presents with   Emesis    [name] is a 31 y.o. female.  Patient is a 31 year old female with past medical history of diabetes, hypertension, depression, OCD, schizophrenia.  Patient presenting today with complaints of vomiting.  Patient started vomiting earlier this evening and presents for evaluation of this.  She denies to me she is having abdominal pain, diarrhea, or bloody stools.  She denies fevers.  The history is provided by the patient.       Home Medications Prior to Admission medications   Medication Sig Start Date End Date Taking? Authorizing Provider  amLODipine (NORVASC) 10 MG tablet Take 10 mg by mouth daily.    [provider]  benztropine (COGENTIN) 0.5 MG tablet Take 1 tablet (0.5 mg total) by mouth 2 (two) times daily. For eps 04/14/22   Lindell Spar I, NP  Cholecalciferol (VITAMIN D3) 25 MCG (1000 UT) CAPS Take 1,000 Units by mouth daily.    [provider]  gabapentin (NEURONTIN) 100 MG capsule Take 1 capsule (100 mg total) by mouth 3 (three) times daily. For agitation 04/14/22   Lindell Spar I, NP  glipiZIDE (GLUCOTROL XL) 10 MG 24 hr tablet Take 10 mg by mouth daily with breakfast. 03/24/22 04/23/22  [provider]  haloperidol (HALDOL) 10 MG tablet Take 1 tablet (10 mg total) by mouth at bedtime. For mood stabilization 04/14/22   Nwoko, Herbert Pun I, NP  insulin aspart protamine- aspart (NOVOLOG MIX 70/30) (70-30) 100 UNIT/ML injection Inject 20 Units into the skin 2 (two) times daily with a meal.    [provider]  lithium carbonate (ESKALITH) 450 MG ER tablet Take 1 tablet (450 mg total) by mouth at bedtime. For mood stabilization 04/14/22   Lindell Spar I, NP  metoprolol tartrate (LOPRESSOR) 25 MG tablet Take 12.5 mg by mouth 2 (two) times daily.    [provider]  omeprazole (PRILOSEC) 20 MG capsule Take 20 mg by mouth daily.    [provider]  simvastatin (ZOCOR) 40 MG tablet Take 40 mg by mouth at bedtime.    [provider]  traZODone (DESYREL) 50 MG tablet Take 1 tablet (50 mg total) by mouth at bedtime as needed for sleep. 04/14/22   Lindell Spar I, NP  venlafaxine XR (EFFEXOR-XR) 75 MG 24 hr capsule Take 1 capsule (75 mg total) by mouth daily with breakfast. For depression 04/15/22   Lindell Spar I, NP      Allergies    Wellbutrin [bupropion], Omnipaque [iohexol], Penicillins, Atarax [hydroxyzine], Contrast media [iodinated contrast media], and Cymbalta [duloxetine hcl]    Review of Systems   Review of Systems  All other systems reviewed and are negative.   Physical Exam Updated Vital Signs BP 134/83   Pulse 84   Temp 97.9 F (36.6 C) (Oral)   Resp 18   Ht '5\' 7"'$  (1.702 m)   Wt 51.7 kg   LMP 03/05/2022 (Approximate)   SpO2 99%   BMI 17.85 kg/m  Physical Exam Vitals and nursing note reviewed.  Constitutional:      General: She is not in acute distress.    Appearance: She is well-developed. She is not diaphoretic.  HENT:     Head: Normocephalic and atraumatic.  Cardiovascular:     Rate and Rhythm: Normal rate and regular rhythm.  Heart sounds: No murmur heard.    No friction rub. No gallop.  Pulmonary:     Effort: Pulmonary effort is normal. No respiratory distress.     Breath sounds: Normal breath sounds. No wheezing.  Abdominal:     General: Bowel sounds are normal. There is no distension.     Palpations: Abdomen is soft.     Tenderness: There is no abdominal tenderness.  Musculoskeletal:        General: Normal range of motion.     Cervical back: Normal range of motion and neck supple.  Skin:    General: Skin is warm and dry.  Neurological:     General: No focal deficit present.     Mental Status: She is alert and oriented to person, place, and time.     ED Results / Procedures  / Treatments   Labs (all labs ordered are listed, but only abnormal results are displayed) Labs Reviewed  RESP PANEL BY RT-PCR (RSV, FLU A&B, COVID)  [id number]    EKG None  Radiology No results found.  Procedures Procedures    Medications Ordered in ED Medications  ondansetron (ZOFRAN-ODT) disintegrating tablet 8 mg (has no administration in time range)    ED Course/ Medical Decision Making/ A&P  Patient presenting here for evaluation of vomiting as described in the HPI.  Patient has been here nearly 6 hours and no vomiting has been witnessed.  In fact she has been eating potato chips and drank an entire jar of Snapple.  Her physical examination is unremarkable, vital signs are stable, and I see no reason to perform any additional workup.  She will be given ODT Zofran and discharged.  Final Clinical Impression(s) / ED Diagnoses Final diagnoses:  None    Rx / DC Orders ED Discharge Orders     None         Veryl Speak, MD 04/16/22 [phone]

## 2022-04-16 NOTE — ED Notes (Signed)
Pt well tolerating PO intake - could be overhead eating what sounds like potato chips; no active vomiting noted.

## 2022-04-16 NOTE — Discharge Instructions (Addendum)
Begin taking Zofran as prescribed as needed for nausea.  Clear liquid diet for the next 12 hours, then slowly advance to normal as tolerated.  Follow-up with your primary doctor if you have additional issues.

## 2022-04-16 NOTE — ED Notes (Addendum)
Pt requests warm blanket, upon entering room to provide blanket, pt states she would like to be checked for "yeast" and requests pelvic exam, this RN asks pt what symptoms she is experiencing, pt states "dryness, odor, and discharge", primary RN and EDP aware

## 2022-04-23 ENCOUNTER — Emergency Department (HOSPITAL_COMMUNITY)
Admission: EM | Admit: 2022-04-23 | Discharge: 2022-04-24 | Disposition: A | Discharge status: Home / Self Care | Payer: Medicaid Other | Attending: Emergency Medicine | Admitting: Emergency Medicine

## 2022-04-23 ENCOUNTER — Encounter (HOSPITAL_COMMUNITY): Payer: Self-pay

## 2022-04-23 ENCOUNTER — Other Ambulatory Visit: Payer: Self-pay

## 2022-04-23 DIAGNOSIS — I10 Essential (primary) hypertension: Secondary | ICD-10-CM | POA: Insufficient documentation

## 2022-04-23 DIAGNOSIS — Z794 Long term (current) use of insulin: Secondary | ICD-10-CM | POA: Diagnosis not present

## 2022-04-23 DIAGNOSIS — R45851 Suicidal ideations: Secondary | ICD-10-CM

## 2022-04-23 DIAGNOSIS — T426X2A Poisoning by other antiepileptic and sedative-hypnotic drugs, intentional self-harm, initial encounter: Secondary | ICD-10-CM | POA: Insufficient documentation

## 2022-04-23 DIAGNOSIS — R4189 Other symptoms and signs involving cognitive functions and awareness: Secondary | ICD-10-CM | POA: Diagnosis present

## 2022-04-23 DIAGNOSIS — Z7982 Long term (current) use of aspirin: Secondary | ICD-10-CM | POA: Diagnosis not present

## 2022-04-23 DIAGNOSIS — T50902A Poisoning by unspecified drugs, medicaments and biological substances, intentional self-harm, initial encounter: Secondary | ICD-10-CM | POA: Diagnosis present

## 2022-04-23 DIAGNOSIS — Z7984 Long term (current) use of oral hypoglycemic drugs: Secondary | ICD-10-CM | POA: Diagnosis not present

## 2022-04-23 DIAGNOSIS — E1165 Type 2 diabetes mellitus with hyperglycemia: Secondary | ICD-10-CM | POA: Diagnosis not present

## 2022-04-23 DIAGNOSIS — F411 Generalized anxiety disorder: Secondary | ICD-10-CM | POA: Diagnosis present

## 2022-04-23 LAB — LITHIUM LEVEL: Lithium Lvl: 0.15 mmol/L — ABNORMAL LOW (ref 0.60–1.20)

## 2022-04-23 LAB — HCG, QUANTITATIVE, PREGNANCY: hCG, Beta Chain, Quant, S: <1 m[IU]/mL (ref <5)

## 2022-04-23 LAB — CBC
HCT: 34.9 % — ABNORMAL LOW (ref 36.0–46.0)
Hemoglobin: 10.9 g/dL — ABNORMAL LOW (ref 12.0–15.0)
MCH: 24.1 pg — ABNORMAL LOW (ref 26.0–34.0)
MCHC: 31.2 g/dL (ref 30.0–36.0)
MCV: 77.2 fL — ABNORMAL LOW (ref 80.0–100.0)
Platelets: 273 10*3/uL (ref 150–400)
RBC: 4.52 MIL/uL (ref 3.87–5.11)
RDW: 15.4 % (ref 11.5–15.5)
WBC: 13 10*3/uL — ABNORMAL HIGH (ref 4.0–10.5)
nRBC: 0 % (ref 0.0–0.2)

## 2022-04-23 LAB — COMPREHENSIVE METABOLIC PANEL
ALT: 21 U/L (ref 0–44)
AST: 25 U/L (ref 15–41)
Albumin: 3.9 g/dL (ref 3.5–5.0)
Alkaline Phosphatase: 62 U/L (ref 38–126)
Anion gap: 7 (ref 5–15)
BUN: 14 mg/dL (ref 6–20)
CO2: 24 mmol/L (ref 22–32)
Calcium: 9.4 mg/dL (ref 8.9–10.3)
Chloride: 108 mmol/L (ref 98–111)
Creatinine, Ser: 0.85 mg/dL (ref 0.44–1.00)
GFR, Estimated: >60 mL/min (ref >60)
Glucose, Bld: 195 mg/dL — ABNORMAL HIGH (ref 70–99)
Potassium: 3.6 mmol/L (ref 3.5–5.1)
Sodium: 139 mmol/L (ref 135–145)
Total Bilirubin: 0.4 mg/dL (ref 0.3–1.2)
Total Protein: 7.9 g/dL (ref 6.5–8.1)

## 2022-04-23 LAB — ETHANOL: Alcohol, Ethyl (B): <10 mg/dL (ref <10)

## 2022-04-23 LAB — SALICYLATE LEVEL: Salicylate Lvl: <7 mg/dL — ABNORMAL LOW (ref 7.0–30.0)

## 2022-04-23 LAB — ACETAMINOPHEN LEVEL: Acetaminophen (Tylenol), Serum: <10 ug/mL — ABNORMAL LOW (ref 10–30)

## 2022-04-23 LAB — MAGNESIUM: Magnesium: 1.9 mg/dL (ref 1.7–2.4)

## 2022-04-23 MED ORDER — LACTATED RINGERS IV BOLUS: 1000.0000 mL | Freq: Once | INTRAVENOUS | Status: DC

## 2022-04-23 MED ORDER — LORAZEPAM 1 MG PO TABS
1.0000 mg | ORAL_TABLET | Freq: Once | ORAL | Status: AC
  Administered 2022-04-23: 1 mg via ORAL
  Filled 2022-04-23: qty 1

## 2022-04-23 MED ORDER — LORAZEPAM 1 MG PO TABS: 1.0000 mg | ORAL_TABLET | Freq: Once | ORAL | Status: DC | PRN

## 2022-04-23 NOTE — ED Triage Notes (Signed)
Pt BIB [facility] from home for an overdose. Pt. Took 5 gabapentin, 3 of haldol, and 2 lithium pills. Pt. Is CAOx4 c/o fatigue. Pt. States that she took the pills because she feels like ending her life because no one cares about her.

## 2022-04-23 NOTE — ED Provider Notes (Signed)
[place] DEPT Provider Note   CSN: [id number] Arrival date & time: 04/23/22  2046     History  Chief Complaint  Patient presents with   Psychiatric Evaluation   Suicidal    [name] is a 31 y.o. female.  With PMH of anxiety, depression, bipolar disorder, HTN, DM brought in by EMS from home for intentional overdose.  Patient took 5 gabapentin 100 mg capsules, three 10 mg Haldol capsule and 2 lithium 450 mg extended release pills.  Patient is very well-known to this ED and is unclear if patient truly took these medications or not.  Patient has no medical complaints on my exam.  She is actually requesting to leave the ER.  When she first arrived she was complaining of generalized fatigue and she tells me that she took this because she feels like no one cares about her and wanting to end her life.  She has had no vomiting, no diarrhea, no fevers, no confusion, no focal weakness.  Denies any HI or AVH.  HPI     Home Medications Prior to Admission medications   Medication Sig Start Date End Date Taking? Authorizing Provider  amLODipine (NORVASC) 10 MG tablet Take 10 mg by mouth daily.    [provider]  benztropine (COGENTIN) 0.5 MG tablet Take 1 tablet (0.5 mg total) by mouth 2 (two) times daily. For eps 04/14/22   Lindell Spar I, NP  Cholecalciferol (VITAMIN D3) 25 MCG (1000 UT) CAPS Take 1,000 Units by mouth daily.    [provider]  gabapentin (NEURONTIN) 100 MG capsule Take 1 capsule (100 mg total) by mouth 3 (three) times daily. For agitation 04/14/22   Lindell Spar I, NP  glipiZIDE (GLUCOTROL XL) 10 MG 24 hr tablet Take 10 mg by mouth daily with breakfast. 03/24/22 04/23/22  [provider]  haloperidol (HALDOL) 10 MG tablet Take 1 tablet (10 mg total) by mouth at bedtime. For mood stabilization 04/14/22   Nwoko, Herbert Pun I, NP  insulin aspart protamine- aspart (NOVOLOG MIX 70/30) (70-30) 100 UNIT/ML injection  Inject 20 Units into the skin 2 (two) times daily with a meal.    [provider]  lithium carbonate (ESKALITH) 450 MG ER tablet Take 1 tablet (450 mg total) by mouth at bedtime. For mood stabilization 04/14/22   Lindell Spar I, NP  metoprolol tartrate (LOPRESSOR) 25 MG tablet Take 12.5 mg by mouth 2 (two) times daily.    [provider]  omeprazole (PRILOSEC) 20 MG capsule Take 20 mg by mouth daily.    [provider]  ondansetron (ZOFRAN-ODT) 8 MG disintegrating tablet '8mg'$  ODT q4 hours prn nausea 04/16/22   Veryl Speak, MD  simvastatin (ZOCOR) 40 MG tablet Take 40 mg by mouth at bedtime.    [provider]  traZODone (DESYREL) 50 MG tablet Take 1 tablet (50 mg total) by mouth at bedtime as needed for sleep. 04/14/22   Lindell Spar I, NP  venlafaxine XR (EFFEXOR-XR) 75 MG 24 hr capsule Take 1 capsule (75 mg total) by mouth daily with breakfast. For depression 04/15/22   Lindell Spar I, NP      Allergies    Wellbutrin [bupropion], Omnipaque [iohexol], Penicillins, Atarax [hydroxyzine], Contrast media [iodinated contrast media], and Cymbalta [duloxetine hcl]    Review of Systems   Review of Systems  Physical Exam Updated Vital Signs BP (!) 155/94   Pulse (!) 101   Temp 98.2 F (36.8 C) (Oral)   Resp  18   LMP 03/05/2022 (Approximate)   SpO2 98%  Physical Exam Constitutional: Alert and orientedx4. Well appearing and in no distress. Eyes: Conjunctivae are normal. ENT      Mouth/Throat: Mucous membranes are moist.      Neck: No stridor. Cardiovascular: S1, S2, mildly tachycardic, regular rhythm Respiratory: Normal respiratory effort.  O2 sat 98 on RA Gastrointestinal: Soft and nondistended Musculoskeletal: Normal range of motion in all extremities. Neurologic: Normal speech and language.  Moving all extremities equally.  No facial droop.  Steady ambulatory gait.  Sensation grossly intact.  No gross focal neurologic deficits are appreciated. Skin:  Skin is warm, dry and intact. No rash noted. Psychiatric: Mood and affect are normal. Speech and behavior are normal.  ED Results / Procedures / Treatments   Labs (all labs ordered are listed, but only abnormal results are displayed) Labs Reviewed  COMPREHENSIVE METABOLIC PANEL - Abnormal; Notable for the following components:      Result Value   Glucose, Bld 195 (*)    All other components within normal limits  CBC - Abnormal; Notable for the following components:   WBC 13.0 (*)    Hemoglobin 10.9 (*)    HCT 34.9 (*)    MCV 77.2 (*)    MCH 24.1 (*)    All other components within normal limits  LITHIUM LEVEL - Abnormal; Notable for the following components:   Lithium Lvl 0.15 (*)    All other components within normal limits  SALICYLATE LEVEL - Abnormal; Notable for the following components:   Salicylate Lvl <1.4 (*)    All other components within normal limits  ACETAMINOPHEN LEVEL - Abnormal; Notable for the following components:   Acetaminophen (Tylenol), Serum <10 (*)    All other components within normal limits  ETHANOL  HCG, QUANTITATIVE, PREGNANCY  MAGNESIUM  RAPID URINE DRUG SCREEN, HOSP PERFORMED  ACETAMINOPHEN LEVEL    EKG EKG Interpretation  Date/Time:  Thursday April 23 2022 21:15:25 EST Ventricular Rate:  97 PR Interval:  178 QRS Duration: 90 QT Interval:  353 QTC Calculation: 449 R Axis:   85 Text Interpretation: Sinus rhythm Borderline repolarization abnormality QTc 449 Confirmed by [name] [phone]) on 04/23/2022 9:48:47 PM  Radiology No results found.  Procedures Procedures  Remain on constant cardiac monitor which I personally reviewed ranging from normal sinus rhythm with normal rates to sinus tachycardia.  Medications Ordered in ED Medications  LORazepam (ATIVAN) tablet 1 mg (1 mg Oral Given 04/23/22 2226)    ED Course/ Medical Decision Making/ A&P Clinical Course as of 04/23/22 2354  Thu Apr 23, 2022  2247  Spoke with Patty  from [place] control. If lithium >1, then will need fluids NS. Rpt EKG at 4 hour mark to ensure no Qrs widening or Qtc prolongation. Requesting acetaminophen level and salicylate level and call back to [place] poison control. [VB]  2353 Reviewed drug levels with Patty from [place] poison control now that they have resulted, recommending repeat EKG and acetaminophen level at midnight and if negative likely medically cleared. [VB]    Clinical Course User Index [VB] Elgie Congo, MD                           Medical Decision Making Alani Cobi Aldape is a 31 y.o. female.  With PMH of anxiety, depression, bipolar disorder, HTN, DM brought in by EMS from home for intentional overdose.  Patient took 5 gabapentin 100 mg  capsules, three 10 mg Haldol capsule and 2 lithium 450 mg extended release pills.  Patient is very well-known to this ED and is unclear if patient truly took these medications or not.  Based on medications patient took, she should be somnolent or altered. She is neurologically intact on my exam and not exhibiting any evidence of somnolence or respiratory depression.   EKG reviewed, reassuring NSR with normal QRS and normal Qtc.   Labs obtained which I personally reviewed generally unremarkable and reassuring with normal creatinine 0.85, mild hyperglycemia 195 with normal bicarbonate and no anion gap.  Ethanol level undetected, acetaminophen and salicylate levels undetected.  Lithium 0.15.  Spoke with nurse Patty from [place] [facility] control requesting repeat acetaminophen levels and EKG at 4-hour mark which have been ordered.  If negative, medically cleared. S/o to Dr Matilde Sprang to follow up.   TTS consulted for presentation today.   Amount and/or Complexity of Data Reviewed Labs: ordered.  Risk Prescription drug management.    Final Clinical Impression(s) / ED Diagnoses Final diagnoses:  Suicidal ideation  Intentional overdose, initial encounter [facility])    Rx / [place] Orders ED Discharge  Orders     None         Elgie Congo, MD 04/23/22 2354

## 2022-04-23 NOTE — ED Notes (Signed)
Pt. States, "I don't want an IV, I'll just sleep it off."

## 2022-04-23 NOTE — ED Notes (Signed)
Pt ambulatory to bathroom

## 2022-04-24 DIAGNOSIS — R45851 Suicidal ideations: Secondary | ICD-10-CM

## 2022-04-24 LAB — RAPID URINE DRUG SCREEN, HOSP PERFORMED
Amphetamines: NOT DETECTED
Barbiturates: NOT DETECTED
Benzodiazepines: NOT DETECTED
Cocaine: NOT DETECTED
Opiates: NOT DETECTED
Tetrahydrocannabinol: NOT DETECTED

## 2022-04-24 LAB — ACETAMINOPHEN LEVEL: Acetaminophen (Tylenol), Serum: <10 ug/mL — ABNORMAL LOW (ref 10–30)

## 2022-04-24 NOTE — ED Notes (Signed)
Pt. Refused repeat lab draw.

## 2022-04-24 NOTE — ED Provider Notes (Signed)
Emergency Medicine Observation Re-evaluation Note  [name] is a 31 y.o. female, seen on rounds today.  Pt initially presented to the ED for complaints of Psychiatric Evaluation and Suicidal Currently, the patient is awaiting psych disposition.  Physical Exam  BP 137/86   Pulse 99   Temp 98.2 F (36.8 C) (Oral)   Resp 20   LMP 03/05/2022 (Approximate)   SpO2 98%  Physical Exam General: NAD, sleeping Cardiac: RR Lungs: even unlabored Psych: NA  ED Course / MDM  EKG:EKG Interpretation  Date/Time:  Friday April 24 2022 05:17:19 EST Ventricular Rate:  95 PR Interval:  177 QRS Duration: 90 QT Interval:  383 QTC Calculation: 482 R Axis:   92 Text Interpretation: Sinus rhythm Borderline right axis deviation Confirmed by [place] ([id number]) on 04/24/2022 5:18:58 AM  I have reviewed the labs performed to date as well as medications administered while in observation.  Recent changes in the last 24 hours include medically cleared.  Plan  Current plan is for psychiatry evaluation.    Gareth Morgan, MD 04/24/22 1104

## 2022-04-24 NOTE — Discharge Instructions (Addendum)
Discharge recommendations:  FOLLOW UP WITH ACT Team.  Patient is to take medications as prescribed. Please see information for follow-up appointment with psychiatry and therapy. Please follow up with your primary care provider for all medical related needs.   Therapy: We recommend that patient participate in individual therapy to address mental health concerns.  Medications: The patient or guardian is to contact a medical professional and/or outpatient provider to address any new side effects that develop. The patient or guardian should update outpatient providers of any new medications and/or medication changes.   Atypical antipsychotics: If you are prescribed an atypical antipsychotic, it is recommended that your height, weight, BMI, blood pressure, fasting lipid panel, and fasting blood sugar be monitored by your outpatient providers.  Safety:  The patient should abstain from use of illicit substances/drugs and abuse of any medications. If symptoms worsen or do not continue to improve or if the patient becomes actively suicidal or homicidal then it is recommended that the patient return to the closest hospital emergency department, the [facility], or call 911 for further evaluation and treatment. National Suicide Prevention Lifeline 1-800-SUICIDE or 581-009-3968.  About 988 988 offers 24/7 access to trained crisis counselors who can help people experiencing mental health-related distress. People can call or text 988 or chat 988lifeline.org for themselves or if they are worried about a loved one who may need crisis support.  Crisis Mobile: [facility]:                     (615)562-6117 (for crisis response 24 hours a day) [place]:                                            [phone]

## 2022-04-24 NOTE — Discharge Summary (Signed)
[facility] ED Discharge  04/24/2022 12:19 PM [name]  MRN:  [id number]  Principal Problem: Suicidal ideation Discharge Diagnoses: Principal Problem:   Suicidal ideation Active Problems:   Generalized anxiety disorder   Cognitive deficits   Intentional overdose ([place])  Clinical Impression:  Final diagnoses:  Suicidal ideation  Intentional overdose, initial encounter [facility])     ED Assessment Time Calculation: Start Time: 1100 Stop Time: 1140 Total Time in Minutes (Assessment Completion): 40   HPI: [name], 31 y.o., female patient seen face to face by this provider, consulted with Dr. Dwyane Dee; and chart reviewed on 04/24/22.  On evaluation [name] reports that she is here because mom made her upset because she was not able to go to mom's house yesterday.  Patient mood appears to be bright she said that she talked with her mom, and mother said that she can come over to her house and play with the new puppy.  Patient states that she lives by herself but she lives close to her mother and she and her mother are very close and her or her siblings and her mother says she could not come over yesterday, when asked patient about the overdose patient nonchalantly stated "I did not highly take any of the pills ".  Patient says my mother is ready for me to come home and I am ready to go home and I don't want to go to an inpatient facility. Patient denies SI/HI/AVH. Patient denies using any illicit drugs or alcohol. She feels safe to go home. She says her sleep and appetite are good.  Patient also says she has an ACT team that she is very active with and she feels comfortable and safe with her ACT team, provider unable to reach ACT team.  During evaluation [name] is sitting ion her bed in no acute distress. She is alert, oriented x 4, calm, cooperative and attentive. Her mood is euthymic with congruent affect.  She has normal speech, and behavior. Objectively there is no  evidence of psychosis/mania or delusional thinking.  Patient is able to converse coherently, goal directed thoughts, no distractibility, or pre-occupation. She also denies suicidal/self-harm/homicidal ideation, psychosis, and paranoia. Patient answered question appropriately. Patient able to name her medications and her diagnosis and understands that she can't continue to be impulsive, provider gave her information to the [facility] when she feels she is in a crisis and needs to talk with someone. Patient said she understood. Patient focused on playing with her mother puppy and seeing her mother.  Provider spoke with patient mother [name] [phone], she said that she spoke with the patient this morning and patient was sound happier, and that patient gets like this when she does not understand that her mom has to go out and run errands, and that she cannot come right over every day.  [name] stated that she and patient are very close and that she feels safe that patient can come home as she supports her daughter.  Consulted with Dr. Dwyane Dee and it was agreed patient is able to be discharged with resources and follow up to [facility] and ACT Team.   At this time [name] is educated and verbalizes understanding of mental health resources and other crisis services in the community. She is instructed to call 911 and present to the nearest emergency room should she experience any suicidal/homicidal ideation, auditory/visual/hallucinations, or detrimental worsening of her mental health condition.   Past Psychiatric History:  Anxiety, Bipolar 1, depression, cognitive deficits   Past Medical History:  Past Medical History:  Diagnosis Date   Anxiety    Bipolar 1 disorder ([place])    Cognitive deficits    Depression    Diabetes mellitus without complication ([place])    Hypertension    Mental disorder    Mental health disorder    Obesity     Past Surgical History:  Procedure Laterality Date   CESAREAN  SECTION     CESAREAN SECTION N/A 04/25/2013   Procedure: REPEAT CESAREAN SECTION;  Surgeon: [name], MD;  Location: [facility];  Service: Obstetrics;  Laterality: N/A;   MASS EXCISION N/A 06/03/2012   Procedure: EXCISION MASS;  Surgeon: [name], MD;  Location: [facility];  Service: ENT;  Laterality: N/A;  Excision uvula mass   TONSILLECTOMY N/A 06/03/2012   Procedure: TONSILLECTOMY;  Surgeon: [name], MD;  Location: [facility];  Service: ENT;  Laterality: N/A;   TONSILLECTOMY     Family History:  Family History  Problem Relation Age of Onset   Hypertension Mother    Diabetes Father     Social History:  Social History   Substance and Sexual Activity  Alcohol Use Not Currently     Social History   Substance and Sexual Activity  Drug Use Not Currently   Types: "Crack" cocaine, Other-see comments   Comment: Patient reports hx of smoking Crack    Social History   Socioeconomic History   Marital status: Single    Spouse name: Not on file   Number of children: Not on file   Years of education: Not on file   Highest education level: Not on file  Occupational History   Not on file  Tobacco Use   Smoking status: Every Day    Types: Cigars   Smokeless tobacco: Never   Tobacco comments:    Pt declined  Vaping Use   Vaping Use: Never used  Substance and Sexual Activity   Alcohol use: Not Currently   Drug use: Not Currently    Types: "Crack" cocaine, Other-see comments    Comment: Patient reports hx of smoking Crack   Sexual activity: Not Currently    Birth control/protection: None    Comment: occasionally  Other Topics Concern   Not on file  Social History Narrative   ** Merged History Encounter **       Social Determinants of Health   Financial Resource Strain: Not on file  Food Insecurity: No Food Insecurity (04/10/2022)   Hunger Vital Sign    Worried About Running Out of Food in the Last Year: Never true    Ran Out  of Food in the Last Year: Never true  Transportation Needs: No Transportation Needs (04/10/2022)   PRAPARE - Hydrologist (Medical): No    Lack of Transportation (Non-Medical): No  Physical Activity: Not on file  Stress: Not on file  Social Connections: Not on file    Tobacco Cessation:  Prescription not provided because: patient did not want it  Current Medications: Current Facility-Administered Medications  Medication Dose Route Frequency Provider Last Rate Last Admin   LORazepam (ATIVAN) tablet 1 mg  1 mg Oral Once PRN Elgie Congo, MD       Current Outpatient Medications  Medication Sig Dispense Refill   amLODipine (NORVASC) 10 MG tablet Take 10 mg by mouth daily.     benztropine (COGENTIN) 0.5 MG tablet Take 1  tablet (0.5 mg total) by mouth 2 (two) times daily. For eps 60 tablet 0   Cholecalciferol (VITAMIN D3) 25 MCG (1000 UT) CAPS Take 1,000 Units by mouth daily.     gabapentin (NEURONTIN) 100 MG capsule Take 1 capsule (100 mg total) by mouth 3 (three) times daily. For agitation 90 capsule 0   haloperidol (HALDOL) 10 MG tablet Take 1 tablet (10 mg total) by mouth at bedtime. For mood stabilization 30 tablet 0   insulin aspart protamine- aspart (NOVOLOG MIX 70/30) (70-30) 100 UNIT/ML injection Inject 20 Units into the skin 2 (two) times daily with a meal.     lithium carbonate (ESKALITH) 450 MG ER tablet Take 1 tablet (450 mg total) by mouth at bedtime. For mood stabilization 30 tablet 0   metoprolol tartrate (LOPRESSOR) 25 MG tablet Take 12.5 mg by mouth 2 (two) times daily.     omeprazole (PRILOSEC) 20 MG capsule Take 20 mg by mouth daily.     ondansetron (ZOFRAN-ODT) 8 MG disintegrating tablet '8mg'$  ODT q4 hours prn nausea 10 tablet 0   simvastatin (ZOCOR) 40 MG tablet Take 40 mg by mouth at bedtime.     traZODone (DESYREL) 50 MG tablet Take 1 tablet (50 mg total) by mouth at bedtime as needed for sleep. 30 tablet 0   venlafaxine XR  (EFFEXOR-XR) 75 MG 24 hr capsule Take 1 capsule (75 mg total) by mouth daily with breakfast. For depression 30 capsule 0   PTA Medications: (Not in a hospital admission)   [facility] from 04/23/2022 in [facility] ED from 04/16/2022 in [facility] Admission (Discharged) from 04/10/2022 in [facility]  C-SSRS RISK CATEGORY Error: Q3, 4, or 5 should not be populated when Q2 is No Error: Q3, 4, or 5 should not be populated when Q2 is No Moderate Risk       Musculoskeletal: Strength & Muscle Tone: within normal limits Gait & Station: normal Patient leans: N/A  Psychiatric Specialty Exam: Presentation  General Appearance:  Appropriate for Environment  Eye Contact: Good  Speech: Clear and Coherent  Speech Volume: Normal  Handedness: Right   Mood and Affect  Mood: Anxious; Euthymic  Affect: Appropriate   Thought Process  Thought Processes: Coherent  Descriptions of Associations:Intact  Orientation:Full (Time, Place and Person)  Thought Content:WDL  History of Schizophrenia/Schizoaffective disorder:No  Duration of Psychotic Symptoms:N/A  Hallucinations:Hallucinations: None  Ideas of Reference:None  Suicidal Thoughts:Suicidal Thoughts: No  Homicidal Thoughts:Homicidal Thoughts: No   Sensorium  Memory: Immediate Fair; Remote Fair  Judgment: Fair  Insight: Fair   Community education officer  Concentration: Fair  Attention Span: Fair  Recall: Good  Fund of Knowledge: Fair  Language: Good   Psychomotor Activity  Psychomotor Activity: Psychomotor Activity: Normal   Assets  Assets: Communication Skills; Housing   Sleep  Sleep: Sleep: Good    Physical Exam: Physical Exam Eyes:     Pupils: Pupils are equal, round, and reactive to light.  Pulmonary:     Effort: Pulmonary effort is normal.  Musculoskeletal:         General: Normal range of motion.     Cervical back: Normal range of motion.  Neurological:     Mental Status: She is alert.  Psychiatric:        Attention and Perception: Attention normal.        Mood and Affect: Mood normal.        Speech:  Speech normal.        Behavior: Behavior normal. Behavior is cooperative.        Thought Content: Thought content normal.        Judgment: Judgment normal.    Review of Systems  Respiratory: Negative.    Musculoskeletal: Negative.   Skin: Negative.   Psychiatric/Behavioral: Negative.     Blood pressure (!) 126/94, pulse (!) 101, temperature 97.9 F (36.6 C), temperature source Oral, resp. rate 20, last menstrual period 03/05/2022, SpO2 100 %. There is no height or weight on file to calculate BMI.   Demographic Factors:  Low socioeconomic status, Living alone, and Unemployed  Loss Factors: Decrease in vocational status and Financial problems/change in socioeconomic status  Historical Factors: Prior suicide attempts and Impulsivity  Risk Reduction Factors:   Positive social support  Continued Clinical Symptoms:  Severe Anxiety and/or Agitation Bipolar Disorder:   Depressive phase Depression:   Impulsivity  Cognitive Features That Contribute To Risk:  Closed-mindedness and Loss of executive function    Suicide Risk:  Minimal: No identifiable suicidal ideation.  Patients presenting with no risk factors but with morbid ruminations; may be classified as minimal risk based on the severity of the depressive symptoms   Medical Decision Making: After thorough evaluation and review of information currently presented on assessment of patient, there is insufficient findings to indicate patient meets criteria for involuntary commitment or require an inpatient level of care. Ms. Arey is alert/oriented x4, organized; mood congruent with affect; and denies suicidal/self-harm/homicidal ideation, psychosis, and paranoia.  Currently she is not  significantly impaired, psychotic, or manic on exam.  A detailed risk assessment has been completed based on clinical exam and individual risk factors.  Patient acute suicide risk is low; and a safety plan has been created jointly which involves patient following up behavioral health urgent care.     At this time patient is educated and verbalizes understanding of mental health resources and other crisis services in the community. They are instructed to call 911 and present to the nearest emergency room should they experience any suicidal/homicidal ideation, auditory/visual/hallucinations, or detrimental worsening of their mental health condition. Writer also advised the patient to call the toll-free phone on insurance card to assist with identifying in network counselors and agencies Psychiatrically Cleared   Disposition: Disposition: Patient does not meet criteria for psychiatric inpatient admission. Supportive therapy provided about ongoing stressors. Discussed crisis plan, support from social network, calling 911, coming to the Emergency Department, and calling Suicide Hotline.   [place], PMHNP 04/24/2022, 12:19 PM

## 2022-04-24 NOTE — ED Provider Notes (Signed)
  Physical Exam  BP 137/86   Pulse 99   Temp 98.2 F (36.8 C) (Oral)   Resp 20   LMP 03/05/2022 (Approximate)   SpO2 98%   Physical Exam Vitals and nursing note reviewed.  Constitutional:      General: She is not in acute distress.    Appearance: She is well-developed.  HENT:     Head: Normocephalic and atraumatic.  Eyes:     Conjunctiva/sclera: Conjunctivae normal.  Cardiovascular:     Rate and Rhythm: Normal rate and regular rhythm.     Heart sounds: No murmur heard. Pulmonary:     Effort: Pulmonary effort is normal. No respiratory distress.  Musculoskeletal:        General: No swelling.     Cervical back: Neck supple.  Skin:    General: Skin is warm and dry.     Capillary Refill: Capillary refill takes less than 2 seconds.  Neurological:     Mental Status: She is alert.  Psychiatric:        Mood and Affect: Mood normal.     Procedures  Procedures  ED Course / MDM   Clinical Course as of 04/24/22 0505  Thu Apr 23, 2022  2247  Spoke with Patty from [facility] control. If lithium >1, then will need fluids NS. Rpt EKG at 4 hour mark to ensure no Qrs widening or Qtc prolongation. Requesting acetaminophen level and salicylate level and call back to [facility] poison control. [VB]  2353 Reviewed drug levels with Patty from [facility] control now that they have resulted, recommending repeat EKG and acetaminophen level at midnight and if negative likely medically cleared. [VB]  Fri Apr 24, 2022  0010 Repeat tylenol and ECG and TTS [MK]    Clinical Course User Index [MK] Dera Vanaken, Debe Coder, MD [VB] Elgie Congo, MD   Medical Decision Making Amount and/or Complexity of Data Reviewed Labs: ordered.  Risk Prescription drug management.   Patient received an handoff.  Pending repeat ECG and repeat Tylenol level.  Both reassuringly negative for acute life-threatening pathology and patient is med cleared for psychiatric evaluation       Eugean Arnott, Debe Coder, MD 04/24/22  [phone]

## 2022-04-24 NOTE — ED Notes (Signed)
Stacy Rosier, NP recommends continuous observation to be reassessed by psychiatry in the morning

## 2022-04-24 NOTE — ED Notes (Signed)
Pt changed into burgundy scrubs. Cords taken out of pt's room. Pt has 2 belonging's bag and 1 purse. All LABELED and placed in cabinets 16-18. Security notified to wand pt.

## 2022-04-24 NOTE — BH Assessment (Signed)
Comprehensive Clinical Assessment (CCA) Note  04/24/2022 [name] [id number]  Disposition: [name], NP recommends continuous observation to be reassessed by psychiatry in the morning. Dr. Nechama Guard and RN Alberteen Sam aware of the recommendation.  The patient demonstrates the following risk factors for suicide: Chronic risk factors for suicide include: previous suicide attempts by overdose . Acute risk factors for suicide include: family or marital conflict. Protective factors for this patient include: positive therapeutic relationship. Considering these factors, the overall suicide risk at this point appears to be high. Patient is not appropriate for outpatient follow up.  Flowsheet Row ED from 04/23/2022 in [facility] ED from 04/16/2022 in [facility] Admission (Discharged) from 04/10/2022 in [facility]  C-SSRS RISK CATEGORY High Risk Error: Q3, 4, or 5 should not be populated when Q2 is No Moderate Risk      [name] is a 31 y.o. single female who presents to [facility] ED via EMS following an overdose attempt. Per ED note, Pt took 5 gabapentin, 3 haldol and 2 lithium pills. Pt has a history of schizoaffective disorder, bipolar type and has had numerous ED visits in the past six months. Pt reports she overdosed tonight due to getting upset that her mother would not allow her to come to her home. Pt states she contacted 911 on her own. Pt says she has been depressed for years, however she is denying current SI. Pt reports "I feel better." Pt denies auditory or visual hallucinations. Pt also denies HI. Pt denies having access to guns or weapons.   Pt denies any current stressors. Pt lives alone and has an Agricultural consultant through [employer]. Pt states she last saw her ACTT Team earlier today and says she is compliant with her medications.  Pt is dressed in casual clothes,  oriented x4 with normal speech. Pt is lethargic and falls asleep throughout assessment. There is no indication Pt is responding to internal stimuli. Pt is cooperative and fixates on wanting to return home. Pt states she be safe returning home and denies current SI.    Chief Complaint:  Chief Complaint  Patient presents with   Psychiatric Evaluation   Suicidal   Visit Diagnosis: Schizoaffective disorder, bipolar type    CCA Screening, Triage and Referral (STR)  Patient Reported Information How did you hear about [place]? Other (Comment) ([facility])  What Is the Reason for Your Visit/Call Today? Pt reports she overdosed due to being upset with her mother, about 4 hours ago.  How Long Has This Been Causing You Problems? > than 6 months  What Do You Feel Would Help You the Most Today? Treatment for Depression or other mood problem   Have You Recently Had Any Thoughts About Hurting Yourself? Yes  Are You Planning to Commit Suicide/Harm Yourself At This time? No   Flowsheet Row ED from 04/23/2022 in [facility] ED from 04/16/2022 in [facility] Admission (Discharged) from 04/10/2022 in [facility]  C-SSRS RISK CATEGORY High Risk Error: Q3, 4, or 5 should not be populated when Q2 is No Moderate Risk       Have you Recently Had Thoughts About [place]? No  Are You Planning to Harm Someone at This Time? No  Explanation: N/A   Have You Used Any Alcohol or Drugs in the Past 24 Hours? No  What Did You Use and How Much?  N/A   Do You Currently Have a Therapist/Psychiatrist? No  Name of Therapist/Psychiatrist: Name of Therapist/Psychiatrist: N/A   Have You Been Recently Discharged From Any Office Practice or Programs? No  Explanation of Discharge From Practice/Program: N/A     CCA Screening Triage Referral Assessment Type of Contact: Tele-Assessment  Telemedicine Service  Delivery: Telemedicine service delivery: This service was provided via telemedicine using a 2-way, interactive audio and video technology  Is this Initial or Reassessment? Is this Initial or Reassessment?: Initial Assessment  Date Telepsych consult ordered in [facility]:  Date Telepsych consult ordered in [facility]: 04/23/22  Time Telepsych consult ordered in [facility]:  Time Telepsych consult ordered in [facility]: 2312  Location of Assessment: [facility] ED  Provider Location: [facility] Assessment Services   Collateral Involvement: N/A   Does Patient Have a Stage manager Guardian? No  Legal Guardian Contact Information: N/A  Copy of Legal Guardianship Form: -- (N/A)  Legal Guardian Notified of Arrival: -- (N/A)  Legal Guardian Notified of Pending Discharge: -- (N/A)  If Minor and Not Living with Parent(s), Who has Custody? N/A  Is CPS involved or ever been involved? Never  Is APS involved or ever been involved? Never   Patient Determined To Be At Risk for Harm To Self or Others Based on Review of Patient Reported Information or Presenting Complaint? No  Method: -- (N/A)  Availability of Means: -- (N/A)  Intent: -- (N/A)  Notification Required: -- (N/A)  Additional Information for Danger to Others Potential: Previous attempts  Additional Comments for Danger to Others Potential: N/A  Are There Guns or Other Weapons in Your Home? No  Types of Guns/Weapons: N/A  Are These Weapons Safely Secured?                            -- (N/A)  Who Could Verify You Are Able To Have These Secured: N/A  Do You Have any Outstanding Charges, Pending Court Dates, Parole/Probation? N/A  Contacted To Inform of Risk of Harm To Self or Others: -- (N/A)    Does Patient Present under Involuntary Commitment? No    [place] of Residence: [place]   Patient Currently Receiving the Following Services: ACTT Architect); Medication Management   Determination of Need: Emergent (2  hours)   Options For Referral: Inpatient Hospitalization     CCA Biopsychosocial Patient Reported Schizophrenia/Schizoaffective Diagnosis in Past: Yes   Strengths: Pt has an ACTT Team   Mental Health Symptoms Depression:   Worthlessness; Hopelessness   Duration of Depressive symptoms:  Duration of Depressive Symptoms: Greater than two weeks   Mania:   None   Anxiety:    None   Psychosis:   None   Duration of Psychotic symptoms:  Duration of Psychotic Symptoms: N/A   Trauma:   None   Obsessions:   None   Compulsions:   None   Inattention:   None   Hyperactivity/Impulsivity:   None   Oppositional/Defiant Behaviors:   None   Emotional Irregularity:   None   Other Mood/Personality Symptoms:   N/A    Mental Status Exam Appearance and self-care  Stature:   Average   Weight:   Average weight   Clothing:   Disheveled   Grooming:   Normal   Cosmetic use:   None   Posture/gait:   Normal   Motor activity:   Not Remarkable   Sensorium  Attention:   Distractible   Concentration:  Preoccupied   Orientation:   X5   Recall/memory:   Normal   Affect and Mood  Affect:   Flat   Mood:   Depressed   Relating  Eye contact:   Normal   Facial expression:   Responsive   Attitude toward examiner:   Cooperative   Thought and Language  Speech flow:  Normal   Thought content:   Appropriate to Mood and Circumstances   Preoccupation:   None   Hallucinations:   None   Organization:   Loose   Transport planner of Knowledge:   Average   Intelligence:   Average   Abstraction:   Normal   Judgement:   Fair   Art therapist:   Adequate   Insight:   Fair   Decision Making:   Impulsive   Social Functioning  Social Maturity:   Impulsive   Social Judgement:   Normal   Stress  Stressors:   Family conflict   Coping Ability:   Programme researcher, broadcasting/film/video Deficits:   None   Supports:   Family      Religion: Religion/Spirituality Are You A Religious Person?: No How Might This Affect Treatment?: N/A  Leisure/Recreation: Leisure / Recreation Do You Have Hobbies?: No Leisure and Hobbies: N/A  Exercise/Diet: Exercise/Diet Do You Exercise?: No What Type of Exercise Do You Do?:  (N/A) How Many Times a Week Do You Exercise?:  (N/A) Have You Gained or Lost A Significant Amount of Weight in the Past Six Months?: No Do You Follow a Special Diet?: No Do You Have Any Trouble Sleeping?: No Explanation of Sleeping Difficulties: N/A   CCA Employment/Education Employment/Work Situation: Employment / Work Situation Employment Situation: On disability Why is Patient on Disability: 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 How Long has Patient Been on Disability: Since age 6 Patient's Job has Been Impacted by Current Illness: No  Education: Education Is Patient Currently Attending School?: No Last Grade Completed: 11 Did You Attend College?: No Did You Have An Individualized Education Program (IIEP): No Did You Have Any Difficulty At School?: No Were Any Medications Ever Prescribed For These Difficulties?: No Patient's Education Has Been Impacted by Current Illness: No   CCA Family/Childhood History Family and Relationship History: Family history Marital status: Single Long term relationship, how long?: N/A What types of issues is patient dealing with in the relationship?: N/A Additional relationship information: N/A Does patient have children?: Yes How many children?: 3 How is patient's relationship with their children?: Estranged  Childhood History:  Childhood History By whom was/is the patient raised?: Mother Description of patient's current relationship with siblings: N/A Did patient suffer any verbal/emotional/physical/sexual abuse as a child?: No Did patient suffer from severe childhood neglect?: No Has patient ever been sexually abused/assaulted/raped as an  adolescent or adult?: No Was the patient ever a victim of a crime or a disaster?: No Witnessed domestic violence?: No Has patient been affected by domestic violence as an adult?: No Description of domestic violence: N/A       CCA Substance Use Alcohol/Drug Use: Alcohol / Drug Use Pain Medications: See MAR Prescriptions: See MAR Over the Counter: See MAR History of alcohol / drug use?:  (N/A) Longest period of sobriety (when/how long): N/A Negative Consequences of Use:  (N/A) Withdrawal Symptoms:  (N/A)                         ASAM's:  Six Dimensions of Multidimensional Assessment  Dimension 1:  Acute Intoxication and/or Withdrawal Potential:      Dimension 2:  Biomedical Conditions and Complications:      Dimension 3:  Emotional, Behavioral, or Cognitive Conditions and Complications:     Dimension 4:  Readiness to Change:     Dimension 5:  Relapse, Continued use, or Continued Problem Potential:     Dimension 6:  Recovery/Living Environment:     ASAM Severity Score:    ASAM Recommended Level of Treatment:     Substance use Disorder (SUD)    Recommendations for Services/Supports/Treatments:    Discharge Disposition:    DSM5 Diagnoses: Patient Active Problem List   Diagnosis Date Noted   Suicidal ideations 01/04/2022   Suicidal overdose ([place]) 03/23/2021   Syphilis 07/15/2020   Malingering 06/05/2020   Gastroesophageal reflux disease 05/04/2020   Hyperglycemia due to type 2 diabetes mellitus ([place]) 05/04/2020   Long term (current) use of insulin ([place]) 05/04/2020   Migraine without aura 05/04/2020   Morbid obesity ([place]) 05/04/2020   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 2 diabetes mellitus ([place]) 05/04/2020   Prolapsed internal hemorrhoids 05/04/2020   Vitamin D deficiency 05/04/2020   Other symptoms and signs involving cognitive functions and awareness 05/04/2020   Suicide attempt ([place])    Anxiety state 03/06/2020   Schizophrenia ([place]) 09/13/2019   Bipolar I  disorder, most recent episode depressed ([place]) 06/23/2019   MDD (major depressive disorder) 10/10/2018   Schizoaffective disorder, bipolar type ([place]) 09/25/2018   Affective psychosis, bipolar ([place]) 06/13/2018   HTN (hypertension) 05/03/2018   Tobacco use disorder 05/03/2018   Adjustment disorder with emotional disturbance 01/02/2018   Schizophrenia, 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   Moderate bipolar I disorder, most recent episode depressed ([place])    Schizoaffective disorder (HCC)    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/2017   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   OCD (obsessive compulsive disorder) 10/05/2016   Major depressive disorder, recurrent episode, mild ([place]) 05/04/2016   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   Learning disability 07/18/2015   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/2015   Diabetes mellitus ([place]) 07/18/2015   MDD (major depressive disorder), recurrent, severe, 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

## 2022-04-29 ENCOUNTER — Ambulatory Visit (HOSPITAL_COMMUNITY)
Admission: EM | Admit: 2022-04-29 | Discharge: 2022-04-30 | Disposition: A | Discharge status: Other Acute Inpatient Hospital | Payer: Medicaid Other | Attending: Psychiatry | Admitting: Psychiatry

## 2022-04-29 ENCOUNTER — Other Ambulatory Visit: Payer: Self-pay

## 2022-04-29 DIAGNOSIS — E669 Obesity, unspecified: Secondary | ICD-10-CM | POA: Insufficient documentation

## 2022-04-29 DIAGNOSIS — F319 Bipolar disorder, unspecified: Secondary | ICD-10-CM | POA: Insufficient documentation

## 2022-04-29 DIAGNOSIS — Z1152 Encounter for screening for COVID-19: Secondary | ICD-10-CM | POA: Diagnosis not present

## 2022-04-29 DIAGNOSIS — Z3202 Encounter for pregnancy test, result negative: Secondary | ICD-10-CM | POA: Diagnosis not present

## 2022-04-29 DIAGNOSIS — F419 Anxiety disorder, unspecified: Secondary | ICD-10-CM | POA: Insufficient documentation

## 2022-04-29 DIAGNOSIS — R45851 Suicidal ideations: Secondary | ICD-10-CM | POA: Insufficient documentation

## 2022-04-29 DIAGNOSIS — E119 Type 2 diabetes mellitus without complications: Secondary | ICD-10-CM | POA: Insufficient documentation

## 2022-04-29 DIAGNOSIS — F17290 Nicotine dependence, other tobacco product, uncomplicated: Secondary | ICD-10-CM | POA: Insufficient documentation

## 2022-04-29 DIAGNOSIS — I119 Hypertensive heart disease without heart failure: Secondary | ICD-10-CM | POA: Insufficient documentation

## 2022-04-29 DIAGNOSIS — F259 Schizoaffective disorder, unspecified: Secondary | ICD-10-CM | POA: Insufficient documentation

## 2022-04-29 DIAGNOSIS — R9431 Abnormal electrocardiogram [ECG] [EKG]: Secondary | ICD-10-CM | POA: Insufficient documentation

## 2022-04-29 DIAGNOSIS — R441 Visual hallucinations: Secondary | ICD-10-CM

## 2022-04-29 DIAGNOSIS — F23 Brief psychotic disorder: Secondary | ICD-10-CM

## 2022-04-29 DIAGNOSIS — Z794 Long term (current) use of insulin: Secondary | ICD-10-CM | POA: Insufficient documentation

## 2022-04-29 DIAGNOSIS — Z79899 Other long term (current) drug therapy: Secondary | ICD-10-CM | POA: Insufficient documentation

## 2022-04-29 LAB — POCT URINE DRUG SCREEN - MANUAL ENTRY (I-SCREEN)
POC Amphetamine UR: NOT DETECTED
POC Buprenorphine (BUP): NOT DETECTED
POC Cocaine UR: NOT DETECTED
POC Marijuana UR: NOT DETECTED
POC Methadone UR: NOT DETECTED
POC Methamphetamine UR: NOT DETECTED
POC Morphine: NOT DETECTED
POC Oxazepam (BZO): NOT DETECTED
POC Oxycodone UR: NOT DETECTED
POC Secobarbital (BAR): NOT DETECTED

## 2022-04-29 LAB — RESP PANEL BY RT-PCR (RSV, FLU A&B, COVID)  RVPGX2
Influenza A by PCR: NEGATIVE
Influenza B by PCR: NEGATIVE
Resp Syncytial Virus by PCR: NEGATIVE
SARS Coronavirus 2 by RT PCR: NEGATIVE

## 2022-04-29 LAB — POC SARS CORONAVIRUS 2 AG: SARSCOV2ONAVIRUS 2 AG: NEGATIVE

## 2022-04-29 LAB — POCT PREGNANCY, URINE: Preg Test, Ur: NEGATIVE

## 2022-04-29 LAB — POC URINE PREG, ED: Preg Test, Ur: NEGATIVE

## 2022-04-29 MED ORDER — TRAZODONE HCL 50 MG PO TABS: 50.0000 mg | ORAL_TABLET | Freq: Every evening | ORAL | Status: DC | PRN

## 2022-04-29 MED ORDER — VITAMIN D 25 MCG (1000 UNIT) PO TABS
1000.0000 [IU] | ORAL_TABLET | Freq: Every day | ORAL | Status: DC
  Administered 2022-04-30: 1000 [IU] via ORAL
  Filled 2022-04-29: qty 1

## 2022-04-29 MED ORDER — HALOPERIDOL 5 MG PO TABS
10.0000 mg | ORAL_TABLET | Freq: Every day | ORAL | Status: DC
  Administered 2022-04-30: 10 mg via ORAL
  Filled 2022-04-29: qty 2

## 2022-04-29 MED ORDER — GABAPENTIN 100 MG PO CAPS
100.0000 mg | ORAL_CAPSULE | Freq: Three times a day (TID) | ORAL | Status: DC
  Administered 2022-04-30: 100 mg via ORAL
  Filled 2022-04-29: qty 1

## 2022-04-29 MED ORDER — LITHIUM CARBONATE ER 450 MG PO TBCR
450.0000 mg | EXTENDED_RELEASE_TABLET | Freq: Every day | ORAL | Status: DC
  Administered 2022-04-30: 450 mg via ORAL
  Filled 2022-04-29: qty 1

## 2022-04-29 MED ORDER — AMLODIPINE BESYLATE 10 MG PO TABS
10.0000 mg | ORAL_TABLET | Freq: Every day | ORAL | Status: DC
  Administered 2022-04-30: 10 mg via ORAL
  Filled 2022-04-29: qty 1

## 2022-04-29 MED ORDER — INSULIN ASPART PROT & ASPART (70-30 MIX) 100 UNIT/ML ~~LOC~~ SUSP
20.0000 [IU] | Freq: Two times a day (BID) | SUBCUTANEOUS | Status: DC
  Administered 2022-04-30: 20 [IU] via SUBCUTANEOUS

## 2022-04-29 MED ORDER — ACETAMINOPHEN 325 MG PO TABS: 650.0000 mg | ORAL_TABLET | Freq: Four times a day (QID) | ORAL | Status: DC | PRN

## 2022-04-29 MED ORDER — ALUM & MAG HYDROXIDE-SIMETH 200-200-20 MG/5ML PO SUSP: 30.0000 mL | ORAL | Status: DC | PRN

## 2022-04-29 MED ORDER — ONDANSETRON 4 MG PO TBDP: 8.0000 mg | ORAL_TABLET | Freq: Three times a day (TID) | ORAL | Status: DC | PRN

## 2022-04-29 MED ORDER — VENLAFAXINE HCL ER 75 MG PO CP24
75.0000 mg | ORAL_CAPSULE | Freq: Every day | ORAL | Status: DC
  Administered 2022-04-30: 75 mg via ORAL
  Filled 2022-04-29: qty 1

## 2022-04-29 MED ORDER — MAGNESIUM HYDROXIDE 400 MG/5ML PO SUSP: 30.0000 mL | Freq: Every day | ORAL | Status: DC | PRN

## 2022-04-29 MED ORDER — SIMVASTATIN 20 MG PO TABS
40.0000 mg | ORAL_TABLET | Freq: Every day | ORAL | Status: DC
  Administered 2022-04-30: 40 mg via ORAL
  Filled 2022-04-29: qty 2

## 2022-04-29 MED ORDER — BENZTROPINE MESYLATE 0.5 MG PO TABS
0.5000 mg | ORAL_TABLET | Freq: Two times a day (BID) | ORAL | Status: DC
  Administered 2022-04-30 (×2): 0.5 mg via ORAL
  Filled 2022-04-29 (×2): qty 1

## 2022-04-29 MED ORDER — METOPROLOL TARTRATE 25 MG PO TABS
12.5000 mg | ORAL_TABLET | Freq: Two times a day (BID) | ORAL | Status: DC
  Administered 2022-04-30 (×2): 12.5 mg via ORAL
  Filled 2022-04-29 (×2): qty 1

## 2022-04-29 NOTE — Progress Notes (Signed)
Inpatient Behavioral Health Placement   Pt meets inpatient criteria per [name], NP.  There are no available beds at [facility] per [facility] Wynonia Hazard, RN. Referral was sent to the following facilities;   Destination  Service Provider Address Phone Fax  [facility]  [address], [place] [address] [phone]  [facility]  [place], [place] [address] [phone] [place]  [address], [place] [address] [phone]  [facility]  [place], [place] [address] [phone]  [facility]  [address] [place] [address] [place]  [facility]  [address] [place] [address] [phone]  [facility]  [address] [place] [address] [phone]  [place] [address] [place] [address] [phone]  [facility]  [address] [place] [address] [phone]  [facility]  [address], [place] [address] [phone] [facility]  [address] [place] [address] [phone] [facility]  [address], [place] [address] [place]  [address] [place] [address] [phone]  [facility] [place] [address] [phone] [facility]  [address], [place] [address] [phone]  [facility]  [address] [place], [place] [address] [place]  [facility]  [address],  [place] [address] [phone]    Situation ongoing,  CSW will follow up.   Benjaman Kindler, MSW, [facility] 04/29/2022  @ 10:43 PM

## 2022-04-29 NOTE — ED Provider Notes (Signed)
[facility] Continuous Assessment Admission H&P  Date: 04/30/22 Patient Name: [name] MRN: [id number] Chief Complaint:  Chief Complaint  Patient presents with   Suicidal      Diagnoses:  Final diagnoses:  Visual hallucination  Psychosis, brief reactive (HCC)  Suicidal ideation    HPI: [name],  32 y.o female with a history of schizoaffective disorder, bipolar disorder, suicide ideation most recent OD.  Presented to [facility] via [facility] voluntarily for suicidal ideation.  Per the patient she does feel like all overdosing on her medications, according to patient she cannot stay still she has been up and about all day.  Patient stated that I cannot be still I keep running the streets and kind of hyper, patient keep talking about demons is around her and she can hear them, patient keep talking about witchcraft and evil spirits.  According to patient she took her medication this morning this afternoon and this evening but she is tempted to overdose on the rest of her medications.  Patient does have a history of frequent hospitalization.   Face-to-face observation of patient, patient is alert and oriented x 4, speech is clear, maintaining eye contact.  Upon entering the room patient can be seen pacing the area, writer did ask patient to take a seat and patient was compliant.  Patient appears to be very anxious, very fidgety, mood is pleasant, patient's voice can be very loud at times and patient can be very blunt.  Patient endorsed suicidal ideation stating that she feels like overdosing on her medications.  Patient denies HI, AVH or paranoia.  Patient denies alcohol use.  According to patient she does smoke cigars on a regular basis.  According to patient she lives at home by herself.  Pt has recent labs drawn on 04/23/22.  Will not repeat these lab at this time.    PHQ 2-9:  Flowsheet Row ED from 04/09/2022 in [facility] ED from 01/10/2022 in [facility] ED from 10/24/2021 in [facility]  Thoughts that you would be better off dead, or of hurting yourself in some way Several days More than half the days Several days  PHQ-9 Total Score 12 17 7        Flowsheet Row ED from 04/29/2022 in [facility] ED from 04/23/2022 in [facility] from 04/16/2022 in [facility]  C-SSRS RISK CATEGORY High Risk Error: Q3, 4, or 5 should not be populated when Q2 is No Error: Q3, 4, or 5 should not be populated when Q2 is No        Total Time spent with patient: 20 minutes  Musculoskeletal  Strength & Muscle Tone: within normal limits Gait & Station: normal Patient leans: N/A  Psychiatric Specialty Exam  Presentation General Appearance:  Casual  Eye Contact: Good  Speech: Clear and Coherent  Speech Volume: Normal  Handedness: Right   Mood and Affect  Mood: Anxious; Euphoric  Affect: Blunt   Thought Process  Thought Processes: Coherent  Descriptions of Associations:Intact  Orientation:Full (Time, Place and Person)  Thought Content:WDL  Diagnosis of Schizophrenia or Schizoaffective disorder in past: Yes  Duration of Psychotic Symptoms: N/A  Hallucinations:Hallucinations: None; Visual Description of Visual Hallucinations: seeing demon and witches  Ideas of Reference:None  Suicidal Thoughts:Suicidal Thoughts: Yes, Active SI Active Intent and/or Plan: With Plan; With Intent  Homicidal Thoughts:Homicidal Thoughts: No   Sensorium  Memory: Immediate  Fair  Judgment: Fair  Insight: Poor   Chartered certified accountant: Fair  Attention Span: Fair  Recall: [employer] of Knowledge: Fair  Language: Fair   Psychomotor Activity  Psychomotor Activity: Psychomotor Activity: Normal AIMS Completed?: No   Assets  Assets: Desire for Improvement;  Resilience   Sleep  Sleep: Sleep: Fair Number of Hours of Sleep: 6   Nutritional Assessment (For OBS and FBC admissions only) Has the patient had a weight loss or gain of 10 pounds or more in the last 3 months?: No Has the patient had a decrease in food intake/or appetite?: No Does the patient have dental problems?: No Does the patient have eating habits or behaviors that may be indicators of an eating disorder including binging or inducing vomiting?: No Has the patient recently lost weight without trying?: 0 Has the patient been eating poorly because of a decreased appetite?: 0 Malnutrition Screening Tool Score: 0    Physical Exam HENT:     Head: Normocephalic.     Nose: Nose normal.  Cardiovascular:     Rate and Rhythm: Tachycardia present.  Pulmonary:     Effort: Pulmonary effort is normal.  Musculoskeletal:        General: Normal range of motion.     Cervical back: Normal range of motion.  Neurological:     General: No focal deficit present.     Mental Status: She is alert.  Psychiatric:        Mood and Affect: Mood normal.        Behavior: Behavior normal.        Thought Content: Thought content normal.        Judgment: Judgment normal.    Review of Systems  Constitutional: Negative.   HENT: Negative.    Eyes: Negative.   Respiratory: Negative.    Cardiovascular: Negative.   Gastrointestinal: Negative.   Genitourinary: Negative.   Musculoskeletal: Negative.   Skin: Negative.   Neurological: Negative.   Endo/Heme/Allergies: Negative.   Psychiatric/Behavioral:  Positive for hallucinations and suicidal ideas. The patient is nervous/anxious.     Blood pressure (!) 152/96, pulse (!) 102, temperature 98.8 F (37.1 C), temperature source Oral, resp. rate 20, SpO2 96 %. There is no height or weight on file to calculate BMI.  Past Psychiatric History: Schizoaffective disorder, bipolar disorder, suicidal ideation,  Is the patient at risk to self? Yes  Has the  patient been a risk to self in the past 6 months? Yes .    Has the patient been a risk to self within the distant past? Yes   Is the patient a risk to others? No   Has the patient been a risk to others in the past 6 months? No   Has the patient been a risk to others within the distant past? No   Past Medical History:  Past Medical History:  Diagnosis Date   Anxiety    Bipolar 1 disorder (HCC)    Cognitive deficits    Depression    Diabetes mellitus without complication (HCC)    Hypertension    Mental disorder    Mental health disorder    Obesity     Past Surgical History:  Procedure Laterality Date   CESAREAN SECTION     CESAREAN SECTION N/A 04/25/2013   Procedure: REPEAT CESAREAN SECTION;  Surgeon: Catalina Antigua, MD;  Location: [facility];  Service: Obstetrics;  Laterality: N/A;   MASS EXCISION N/A 06/03/2012   Procedure: EXCISION MASS;  Surgeon: Osborn Coho, MD;  Location: [facility];  Service: ENT;  Laterality: N/A;  Excision uvula mass   TONSILLECTOMY N/A 06/03/2012   Procedure: TONSILLECTOMY;  Surgeon: Osborn Coho, MD;  Location: [facility];  Service: ENT;  Laterality: N/A;   TONSILLECTOMY      Family History:  Family History  Problem Relation Age of Onset   Hypertension Mother    Diabetes Father     Social History:  Social History   Socioeconomic History   Marital status: Single    Spouse name: Not on file   Number of children: Not on file   Years of education: Not on file   Highest education level: Not on file  Occupational History   Not on file  Tobacco Use   Smoking status: Every Day    Types: Cigars   Smokeless tobacco: Never   Tobacco comments:    Pt declined  Vaping Use   Vaping Use: Never used  Substance and Sexual Activity   Alcohol use: Not Currently   Drug use: Not Currently    Types: "Crack" cocaine, Other-see comments    Comment: Patient reports hx of smoking Crack   Sexual activity: Not Currently    Birth  control/protection: None    Comment: occasionally  Other Topics Concern   Not on file  Social History Narrative   ** Merged History Encounter **       Social Determinants of Health   Financial Resource Strain: Not on file  Food Insecurity: No Food Insecurity (04/10/2022)   Hunger Vital Sign    Worried About Running Out of Food in the Last Year: Never true    Ran Out of Food in the Last Year: Never true  Transportation Needs: No Transportation Needs (04/10/2022)   PRAPARE - Administrator, Civil Service (Medical): No    Lack of Transportation (Non-Medical): No  Physical Activity: Not on file  Stress: Not on file  Social Connections: Not on file  Intimate Partner Violence: Not At Risk (04/10/2022)   Humiliation, Afraid, Rape, and Kick questionnaire    Fear of Current or Ex-Partner: No    Emotionally Abused: No    Physically Abused: No    Sexually Abused: No    SDOH:  SDOH Screenings   Food Insecurity: No Food Insecurity (04/10/2022)  Housing: Low Risk  (04/10/2022)  Transportation Needs: No Transportation Needs (04/10/2022)  Utilities: Not At Risk (04/10/2022)  Alcohol Screen: Low Risk  (04/10/2022)  Depression (PHQ2-9): High Risk (04/09/2022)  Tobacco Use: High Risk (04/23/2022)    Last Labs:  Admission on 04/29/2022  Component Date Value Ref Range Status   SARS Coronavirus 2 by RT PCR 04/29/2022 NEGATIVE  NEGATIVE Final   Comment: (NOTE) SARS-CoV-2 target nucleic acids are NOT DETECTED.  The SARS-CoV-2 RNA is generally detectable in upper respiratory specimens during the acute phase of infection. The lowest concentration of SARS-CoV-2 viral copies this assay can detect is 138 copies/mL. A negative result does not preclude SARS-Cov-2 infection and should not be used as the sole basis for treatment or other patient management decisions. A negative result may occur with  improper specimen collection/handling, submission of specimen other than  nasopharyngeal swab, presence of viral mutation(s) within the areas targeted by this assay, and inadequate number of viral copies(<138 copies/mL). A negative result must be combined with clinical observations, patient history, and epidemiological information. The expected result is Negative.  Fact Sheet for Patients:  BloggerCourse.com  Fact  Sheet for Healthcare Providers:  SeriousBroker.it  This test is no                          t yet approved or cleared by the [place] FDA and  has been authorized for detection and/or diagnosis of SARS-CoV-2 by FDA under an Emergency Use Authorization (EUA). This EUA will remain  in effect (meaning this test can be used) for the duration of the COVID-19 declaration under Section 564(b)(1) of the Act, 21 U.S.C.section 360bbb-3(b)(1), unless the authorization is terminated  or revoked sooner.       Influenza A by PCR 04/29/2022 NEGATIVE  NEGATIVE Final   Influenza B by PCR 04/29/2022 NEGATIVE  NEGATIVE Final   Comment: (NOTE) The Xpert Xpress SARS-CoV-2/FLU/RSV plus assay is intended as an aid in the diagnosis of influenza from Nasopharyngeal swab specimens and should not be used as a sole basis for treatment. Nasal washings and aspirates are unacceptable for Xpert Xpress SARS-CoV-2/FLU/RSV testing.  Fact Sheet for Patients: BloggerCourse.com  Fact Sheet for Healthcare Providers: SeriousBroker.it  This test is not yet approved or cleared by the [place] FDA and has been authorized for detection and/or diagnosis of SARS-CoV-2 by FDA under an Emergency Use Authorization (EUA). This EUA will remain in effect (meaning this test can be used) for the duration of the COVID-19 declaration under Section 564(b)(1) of the Act, 21 U.S.C. section 360bbb-3(b)(1), unless the authorization is terminated or revoked.     Resp Syncytial Virus by  PCR 04/29/2022 NEGATIVE  NEGATIVE Final   Comment: (NOTE) Fact Sheet for Patients: BloggerCourse.com  Fact Sheet for Healthcare Providers: SeriousBroker.it  This test is not yet approved or cleared by the [place] FDA and has been authorized for detection and/or diagnosis of SARS-CoV-2 by FDA under an Emergency Use Authorization (EUA). This EUA will remain in effect (meaning this test can be used) for the duration of the COVID-19 declaration under Section 564(b)(1) of the Act, 21 U.S.C. section 360bbb-3(b)(1), unless the authorization is terminated or revoked.  Performed at [facility], [address], [place], [place] [address]    Preg Test, Ur 04/29/2022 Negative  Negative Preliminary   POC Amphetamine UR 04/29/2022 None Detected  NONE DETECTED (Cut Off Level 1000 ng/mL) Preliminary   POC Secobarbital (BAR) 04/29/2022 None Detected  NONE DETECTED (Cut Off Level 300 ng/mL) Preliminary   POC Buprenorphine (BUP) 04/29/2022 None Detected  NONE DETECTED (Cut Off Level 10 ng/mL) Preliminary   POC Oxazepam (BZO) 04/29/2022 None Detected  NONE DETECTED (Cut Off Level 300 ng/mL) Preliminary   POC Cocaine UR 04/29/2022 None Detected  NONE DETECTED (Cut Off Level 300 ng/mL) Preliminary   POC Methamphetamine UR 04/29/2022 None Detected  NONE DETECTED (Cut Off Level 1000 ng/mL) Preliminary   POC Morphine 04/29/2022 None Detected  NONE DETECTED (Cut Off Level 300 ng/mL) Preliminary   POC Methadone UR 04/29/2022 None Detected  NONE DETECTED (Cut Off Level 300 ng/mL) Preliminary   POC Oxycodone UR 04/29/2022 None Detected  NONE DETECTED (Cut Off Level 100 ng/mL) Preliminary   POC Marijuana UR 04/29/2022 None Detected  NONE DETECTED (Cut Off Level 50 ng/mL) Preliminary   SARSCOV2ONAVIRUS 2 AG 04/29/2022 NEGATIVE  NEGATIVE Final   Comment: (NOTE) SARS-CoV-2 antigen NOT DETECTED.   Negative results are presumptive.  Negative results do not  preclude SARS-CoV-2 infection and should not be used as the sole basis for treatment or other patient management decisions, including infection  control decisions, particularly in the presence of clinical signs and  symptoms consistent with COVID-19, or in those who have been in contact with the virus.  Negative results must be combined with clinical observations, patient history, and epidemiological information. The expected result is Negative.  Fact Sheet for Patients: https://www.jennings-kim.com/  Fact Sheet for Healthcare Providers: https://alexander-rogers.biz/  This test is not yet approved or cleared by the [place] FDA and  has been authorized for detection and/or diagnosis of SARS-CoV-2 by FDA under an Emergency Use Authorization (EUA).  This EUA will remain in effect (meaning this test can be used) for the duration of  the COV                          ID-19 declaration under Section 564(b)(1) of the Act, 21 U.S.C. section 360bbb-3(b)(1), unless the authorization is terminated or revoked sooner.     Preg Test, Ur 04/29/2022 NEGATIVE  NEGATIVE Final   Comment:        THE SENSITIVITY OF THIS METHODOLOGY IS >24 mIU/mL   Admission on 04/23/2022, Discharged on 04/24/2022  Component Date Value Ref Range Status   Sodium 04/23/2022 139  135 - 145 mmol/L Final   Potassium 04/23/2022 3.6  3.5 - 5.1 mmol/L Final   Chloride 04/23/2022 108  98 - 111 mmol/L Final   CO2 04/23/2022 24  22 - 32 mmol/L Final   Glucose, Bld 04/23/2022 195 (H)  70 - 99 mg/dL Final   Glucose reference range applies only to samples taken after fasting for at least 8 hours.   BUN 04/23/2022 14  6 - 20 mg/dL Final   Creatinine, Ser 04/23/2022 0.85  0.44 - 1.00 mg/dL Final   Calcium 96/29/5284 9.4  8.9 - 10.3 mg/dL Final   Total Protein 13/24/4010 7.9  6.5 - 8.1 g/dL Final   Albumin 27/25/3664 3.9  3.5 - 5.0 g/dL Final   AST 40/34/7425 25  15 - 41 U/L Final   ALT 04/23/2022 21   0 - 44 U/L Final   Alkaline Phosphatase 04/23/2022 62  38 - 126 U/L Final   Total Bilirubin 04/23/2022 0.4  0.3 - 1.2 mg/dL Final   GFR, Estimated 04/23/2022 >60  >60 mL/min Final   Comment: (NOTE) Calculated using the CKD-EPI Creatinine Equation (2021)    Anion gap 04/23/2022 7  5 - 15 Final   Performed at [facility], [address], [place], [place] [address]   Alcohol, Ethyl (B) 04/23/2022 <10  <10 mg/dL Final   Comment: (NOTE) Lowest detectable limit for serum alcohol is 10 mg/dL.  For medical purposes only. Performed at [facility], [address], [place], [place] [address]    WBC 04/23/2022 13.0 (H)  4.0 - 10.5 K/uL Final   RBC 04/23/2022 4.52  3.87 - 5.11 MIL/uL Final   Hemoglobin 04/23/2022 10.9 (L)  12.0 - 15.0 g/dL Final   HCT 32/95/1884 34.9 (L)  36.0 - 46.0 % Final   MCV 04/23/2022 77.2 (L)  80.0 - 100.0 fL Final   MCH 04/23/2022 24.1 (L)  26.0 - 34.0 pg Final   MCHC 04/23/2022 31.2  30.0 - 36.0 g/dL Final   RDW 16/60/6301 15.4  11.5 - 15.5 % Final   Platelets 04/23/2022 273  150 - 400 K/uL Final   nRBC 04/23/2022 0.0  0.0 - 0.2 % Final   Performed at [facility], [address] [place], [place] [address]  Opiates 04/23/2022 NONE DETECTED  NONE DETECTED Final   Cocaine 04/23/2022 NONE DETECTED  NONE DETECTED Final   Benzodiazepines 04/23/2022 NONE DETECTED  NONE DETECTED Final   Amphetamines 04/23/2022 NONE DETECTED  NONE DETECTED Final   Tetrahydrocannabinol 04/23/2022 NONE DETECTED  NONE DETECTED Final   Barbiturates 04/23/2022 NONE DETECTED  NONE DETECTED Final   Comment: (NOTE) DRUG SCREEN FOR MEDICAL PURPOSES ONLY.  IF CONFIRMATION IS NEEDED FOR ANY PURPOSE, NOTIFY LAB WITHIN 5 DAYS.  LOWEST DETECTABLE LIMITS FOR URINE DRUG SCREEN Drug Class                     Cutoff (ng/mL) Amphetamine and metabolites    1000 Barbiturate and metabolites    200 Benzodiazepine                 200 Opiates  and metabolites        300 Cocaine and metabolites        300 THC                            50 Performed at [facility], [address] [place], [place] [address]    hCG, Conley Rolls, Quant, Kathie Rhodes 04/23/2022 <1  <5 mIU/mL Final   Comment:          GEST. AGE      CONC.  (mIU/mL)   <=1 WEEK        5 - 50     2 WEEKS       50 - 500     3 WEEKS       100 - 10,000     4 WEEKS     1,000 - 30,000     5 WEEKS     3,500 - 115,000   6-8 WEEKS     12,000 - 270,000    12 WEEKS     15,000 - 220,000        FEMALE AND NON-PREGNANT FEMALE:     LESS THAN 5 mIU/mL Performed at [facility], [address] [place], [place] [address]    Lithium Lvl 04/23/2022 0.15 (L)  0.60 - 1.20 mmol/L Final   Performed at [facility], [address], [place], [place] [address]   Magnesium 04/23/2022 1.9  1.7 - 2.4 mg/dL Final   Performed at [facility], [address], [place], [place] [address]   Salicylate Lvl 04/23/2022 <7.0 (L)  7.0 - 30.0 mg/dL Final   Performed at [facility], [address], [place], [place] [address]   Acetaminophen (Tylenol), Serum 04/23/2022 <10 (L)  10 - 30 ug/mL Final   Comment: (NOTE) Therapeutic concentrations vary significantly. A range of 10-30 ug/mL  may be an effective concentration for many patients. However, some  are best treated at concentrations outside of this range. Acetaminophen concentrations >150 ug/mL at 4 hours after ingestion  and >50 ug/mL at 12 hours after ingestion are often associated with  toxic reactions.  Performed at [facility], [address] [place], [place] [address]    Acetaminophen (Tylenol), Serum 04/24/2022 <10 (L)  10 - 30 ug/mL Final   Comment: (NOTE) Therapeutic concentrations vary significantly. A range of 10-30 ug/mL  may be an effective concentration for many patients. However, some  are best treated at concentrations  outside of this range. Acetaminophen concentrations >150 ug/mL at 4 hours after ingestion  and >50 ug/mL at 12 hours after ingestion are often associated with  toxic reactions.  Performed at [facility], [address] [place], [place] [address]   Admission on 04/16/2022, Discharged on 04/16/2022  Component Date Value Ref Range Status   SARS Coronavirus 2 by RT PCR 04/15/2022 NEGATIVE  NEGATIVE Final   Comment: (NOTE) SARS-CoV-2 target nucleic acids are NOT DETECTED.  The SARS-CoV-2 RNA is generally detectable in upper respiratory specimens during the acute phase of infection. The lowest concentration of SARS-CoV-2 viral copies this assay can detect is 138 copies/mL. A negative result does not preclude SARS-Cov-2 infection and should not be used as the sole basis for treatment or other patient management decisions. A negative result may occur with  improper specimen collection/handling, submission of specimen other than nasopharyngeal swab, presence of viral mutation(s) within the areas targeted by this assay, and inadequate number of viral copies(<138 copies/mL). A negative result must be combined with clinical observations, patient history, and epidemiological information. The expected result is Negative.  Fact Sheet for Patients:  BloggerCourse.com  Fact Sheet for Healthcare Providers:  SeriousBroker.it  This test is no                          t yet approved or cleared by the [place] FDA and  has been authorized for detection and/or diagnosis of SARS-CoV-2 by FDA under an Emergency Use Authorization (EUA). This EUA will remain  in effect (meaning this test can be used) for the duration of the COVID-19 declaration under Section 564(b)(1) of the Act, 21 U.S.C.section 360bbb-3(b)(1), unless the authorization is terminated  or revoked sooner.       Influenza A by PCR 04/15/2022 NEGATIVE  NEGATIVE  Final   Influenza B by PCR 04/15/2022 NEGATIVE  NEGATIVE Final   Comment: (NOTE) The Xpert Xpress SARS-CoV-2/FLU/RSV plus assay is intended as an aid in the diagnosis of influenza from Nasopharyngeal swab specimens and should not be used as a sole basis for treatment. Nasal washings and aspirates are unacceptable for Xpert Xpress SARS-CoV-2/FLU/RSV testing.  Fact Sheet for Patients: BloggerCourse.com  Fact Sheet for Healthcare Providers: SeriousBroker.it  This test is not yet approved or cleared by the [place] FDA and has been authorized for detection and/or diagnosis of SARS-CoV-2 by FDA under an Emergency Use Authorization (EUA). This EUA will remain in effect (meaning this test can be used) for the duration of the COVID-19 declaration under Section 564(b)(1) of the Act, 21 U.S.C. section 360bbb-3(b)(1), unless the authorization is terminated or revoked.     Resp Syncytial Virus by PCR 04/15/2022 NEGATIVE  NEGATIVE Final   Comment: (NOTE) Fact Sheet for Patients: BloggerCourse.com  Fact Sheet for Healthcare Providers: SeriousBroker.it  This test is not yet approved or cleared by the [place] FDA and has been authorized for detection and/or diagnosis of SARS-CoV-2 by FDA under an Emergency Use Authorization (EUA). This EUA will remain in effect (meaning this test can be used) for the duration of the COVID-19 declaration under Section 564(b)(1) of the Act, 21 U.S.C. section 360bbb-3(b)(1), unless the authorization is terminated or revoked.  Performed at [facility], [address], [place], [place] [address]   Admission on 04/10/2022, Discharged on 04/14/2022  Component Date Value Ref Range Status   Glucose-Capillary 04/10/2022 205 (H)  70 - 99 mg/dL Final   Glucose reference range applies only to samples taken after fasting for at least 8  hours.  Lithium Lvl 04/11/2022 <0.06 (L)  0.60 - 1.20 mmol/L Final   Performed at [facility], [address] [place], [place] [address]   Glucose-Capillary 04/10/2022 200 (H)  70 - 99 mg/dL Final   Glucose reference range applies only to samples taken after fasting for at least 8 hours.   Glucose-Capillary 04/11/2022 219 (H)  70 - 99 mg/dL Final   Glucose reference range applies only to samples taken after fasting for at least 8 hours.   Glucose-Capillary 04/11/2022 271 (H)  70 - 99 mg/dL Final   Glucose reference range applies only to samples taken after fasting for at least 8 hours.   Glucose-Capillary 04/11/2022 205 (H)  70 - 99 mg/dL Final   Glucose reference range applies only to samples taken after fasting for at least 8 hours.   Glucose-Capillary 04/11/2022 151 (H)  70 - 99 mg/dL Final   Glucose reference range applies only to samples taken after fasting for at least 8 hours.   Glucose-Capillary 04/12/2022 333 (H)  70 - 99 mg/dL Final   Glucose reference range applies only to samples taken after fasting for at least 8 hours.   Glucose-Capillary 04/12/2022 208 (H)  70 - 99 mg/dL Final   Glucose reference range applies only to samples taken after fasting for at least 8 hours.   Glucose-Capillary 04/12/2022 180 (H)  70 - 99 mg/dL Final   Glucose reference range applies only to samples taken after fasting for at least 8 hours.   Glucose-Capillary 04/12/2022 204 (H)  70 - 99 mg/dL Final   Glucose reference range applies only to samples taken after fasting for at least 8 hours.   Glucose-Capillary 04/13/2022 294 (H)  70 - 99 mg/dL Final   Glucose reference range applies only to samples taken after fasting for at least 8 hours.   Glucose-Capillary 04/13/2022 158 (H)  70 - 99 mg/dL Final   Glucose reference range applies only to samples taken after fasting for at least 8 hours.   Glucose-Capillary 04/13/2022 181 (H)  70 - 99 mg/dL Final   Glucose reference range applies only to  samples taken after fasting for at least 8 hours.   Lithium Lvl 04/14/2022 0.17 (L)  0.60 - 1.20 mmol/L Final   Performed at [facility], [address], [place], [place] [address]   Glucose-Capillary 04/13/2022 238 (H)  70 - 99 mg/dL Final   Glucose reference range applies only to samples taken after fasting for at least 8 hours.   Comment 1 04/13/2022 Notify RN   Final   Glucose-Capillary 04/14/2022 292 (H)  70 - 99 mg/dL Final   Glucose reference range applies only to samples taken after fasting for at least 8 hours.   Glucose-Capillary 04/14/2022 214 (H)  70 - 99 mg/dL Final   Glucose reference range applies only to samples taken after fasting for at least 8 hours.  Admission on 04/09/2022, Discharged on 04/10/2022  Component Date Value Ref Range Status   SARS Coronavirus 2 by RT PCR 04/09/2022 NEGATIVE  NEGATIVE Final   Comment: (NOTE) SARS-CoV-2 target nucleic acids are NOT DETECTED.  The SARS-CoV-2 RNA is generally detectable in upper respiratory specimens during the acute phase of infection. The lowest concentration of SARS-CoV-2 viral copies this assay can detect is 138 copies/mL. A negative result does not preclude SARS-Cov-2 infection and should not be used as the sole basis for treatment or other patient management decisions. A negative result may occur with  improper specimen collection/handling, submission of specimen other than nasopharyngeal swab, presence of viral mutation(s) within the areas targeted by this assay, and inadequate number of viral copies(<138 copies/mL). A negative result must be combined with clinical observations, patient history, and epidemiological information. The expected result is Negative.  Fact Sheet for Patients:  BloggerCourse.com  Fact Sheet for Healthcare Providers:  SeriousBroker.it  This test is no                          t yet approved or cleared by the [place] FDA and  has been authorized for detection and/or diagnosis of SARS-CoV-2 by FDA under an Emergency Use Authorization (EUA). This EUA will remain  in effect (meaning this test can be used) for the duration of the COVID-19 declaration under Section 564(b)(1) of the Act, 21 U.S.C.section 360bbb-3(b)(1), unless the authorization is terminated  or revoked sooner.       Influenza A by PCR 04/09/2022 NEGATIVE  NEGATIVE Final   Influenza B by PCR 04/09/2022 NEGATIVE  NEGATIVE Final   Comment: (NOTE) The Xpert Xpress SARS-CoV-2/FLU/RSV plus assay is intended as an aid in the diagnosis of influenza from Nasopharyngeal swab specimens and should not be used as a sole basis for treatment. Nasal washings and aspirates are unacceptable for Xpert Xpress SARS-CoV-2/FLU/RSV testing.  Fact Sheet for Patients: BloggerCourse.com  Fact Sheet for Healthcare Providers: SeriousBroker.it  This test is not yet approved or cleared by the [place] FDA and has been authorized for detection and/or diagnosis of SARS-CoV-2 by FDA under an Emergency Use Authorization (EUA). This EUA will remain in effect (meaning this test can be used) for the duration of the COVID-19 declaration under Section 564(b)(1) of the Act, 21 U.S.C. section 360bbb-3(b)(1), unless the authorization is terminated or revoked.     Resp Syncytial Virus by PCR 04/09/2022 NEGATIVE  NEGATIVE Final   Comment: (NOTE) Fact Sheet for Patients: BloggerCourse.com  Fact Sheet for Healthcare Providers: SeriousBroker.it  This test is not yet approved or cleared by the [place] FDA and has been authorized for detection and/or diagnosis of SARS-CoV-2 by FDA under an Emergency Use Authorization (EUA). This EUA will remain in effect (meaning this test can be used) for the duration of the COVID-19 declaration under Section 564(b)(1) of  the Act, 21 U.S.C. section 360bbb-3(b)(1), unless the authorization is terminated or revoked.  Performed at [facility], [address] [place], [place] [address]    POC Amphetamine UR 04/09/2022 None Detected  NONE DETECTED (Cut Off Level 1000 ng/mL) Final   POC Secobarbital (BAR) 04/09/2022 None Detected  NONE DETECTED (Cut Off Level 300 ng/mL) Final   POC Buprenorphine (BUP) 04/09/2022 None Detected  NONE DETECTED (Cut Off Level 10 ng/mL) Final   POC Oxazepam (BZO) 04/09/2022 None Detected  NONE DETECTED (Cut Off Level 300 ng/mL) Final   POC Cocaine UR 04/09/2022 None Detected  NONE DETECTED (Cut Off Level 300 ng/mL) Final   POC Methamphetamine UR 04/09/2022 None Detected  NONE DETECTED (Cut Off Level 1000 ng/mL) Final   POC Morphine 04/09/2022 None Detected  NONE DETECTED (Cut Off Level 300 ng/mL) Final   POC Methadone UR 04/09/2022 None Detected  NONE DETECTED (Cut Off Level 300 ng/mL) Final   POC Oxycodone UR 04/09/2022 None Detected  NONE DETECTED (Cut Off Level 100 ng/mL) Final   POC Marijuana UR 04/09/2022 None Detected  NONE DETECTED (Cut Off Level 50 ng/mL) Final   Preg Test,  Ur 04/09/2022 NEGATIVE  NEGATIVE Final   Comment:        THE SENSITIVITY OF THIS METHODOLOGY IS >24 mIU/mL    Glucose-Capillary 04/09/2022 230 (H)  70 - 99 mg/dL Final   Glucose reference range applies only to samples taken after fasting for at least 8 hours.   Glucose-Capillary 04/10/2022 201 (H)  70 - 99 mg/dL Final   Glucose reference range applies only to samples taken after fasting for at least 8 hours.   Glucose-Capillary 04/10/2022 208 (H)  70 - 99 mg/dL Final   Glucose reference range applies only to samples taken after fasting for at least 8 hours.   Glucose-Capillary 04/10/2022 207 (H)  70 - 99 mg/dL Final   Glucose reference range applies only to samples taken after fasting for at least 8 hours.  Admission on 04/06/2022, Discharged on 04/07/2022  Component Date Value Ref Range Status    Sodium 04/06/2022 138  135 - 145 mmol/L Final   Potassium 04/06/2022 3.8  3.5 - 5.1 mmol/L Final   Chloride 04/06/2022 105  98 - 111 mmol/L Final   CO2 04/06/2022 25  22 - 32 mmol/L Final   Glucose, Bld 04/06/2022 243 (H)  70 - 99 mg/dL Final   Glucose reference range applies only to samples taken after fasting for at least 8 hours.   BUN 04/06/2022 9  6 - 20 mg/dL Final   Creatinine, Ser 04/06/2022 0.84  0.44 - 1.00 mg/dL Final   Calcium 56/21/3086 9.5  8.9 - 10.3 mg/dL Final   Total Protein 57/84/6962 8.0  6.5 - 8.1 g/dL Final   Albumin 95/28/4132 4.1  3.5 - 5.0 g/dL Final   AST 44/04/270 28  15 - 41 U/L Final   ALT 04/06/2022 21  0 - 44 U/L Final   Alkaline Phosphatase 04/06/2022 67  38 - 126 U/L Final   Total Bilirubin 04/06/2022 0.6  0.3 - 1.2 mg/dL Final   GFR, Estimated 04/06/2022 >60  >60 mL/min Final   Comment: (NOTE) Calculated using the CKD-EPI Creatinine Equation (2021)    Anion gap 04/06/2022 8  5 - 15 Final   Performed at [facility], [address] [place], [place] [address]   Alcohol, Ethyl (B) 04/06/2022 <10  <10 mg/dL Final   Comment: (NOTE) Lowest detectable limit for serum alcohol is 10 mg/dL.  For medical purposes only. Performed at [facility], [address], [place], [place] [address]    Opiates 04/06/2022 NONE DETECTED  NONE DETECTED Final   Cocaine 04/06/2022 NONE DETECTED  NONE DETECTED Final   Benzodiazepines 04/06/2022 NONE DETECTED  NONE DETECTED Final   Amphetamines 04/06/2022 NONE DETECTED  NONE DETECTED Final   Tetrahydrocannabinol 04/06/2022 NONE DETECTED  NONE DETECTED Final   Barbiturates 04/06/2022 NONE DETECTED  NONE DETECTED Final   Comment: (NOTE) DRUG SCREEN FOR MEDICAL PURPOSES ONLY.  IF CONFIRMATION IS NEEDED FOR ANY PURPOSE, NOTIFY LAB WITHIN 5 DAYS.  LOWEST DETECTABLE LIMITS FOR URINE DRUG SCREEN Drug Class                     Cutoff (ng/mL) Amphetamine and metabolites     1000 Barbiturate and metabolites    200 Benzodiazepine                 200 Opiates and metabolites        300 Cocaine and metabolites        300 THC  50 Performed at [facility], [address] [place], [place] [address]    WBC 04/06/2022 10.7 (H)  4.0 - 10.5 K/uL Final   RBC 04/06/2022 4.66  3.87 - 5.11 MIL/uL Final   Hemoglobin 04/06/2022 11.2 (L)  12.0 - 15.0 g/dL Final   HCT 62/13/0865 36.2  36.0 - 46.0 % Final   MCV 04/06/2022 77.7 (L)  80.0 - 100.0 fL Final   MCH 04/06/2022 24.0 (L)  26.0 - 34.0 pg Final   MCHC 04/06/2022 30.9  30.0 - 36.0 g/dL Final   RDW 78/46/9629 15.3  11.5 - 15.5 % Final   Platelets 04/06/2022 259  150 - 400 K/uL Final   nRBC 04/06/2022 0.0  0.0 - 0.2 % Final   Neutrophils Relative % 04/06/2022 56  % Final   Neutro Abs 04/06/2022 6.0  1.7 - 7.7 K/uL Final   Lymphocytes Relative 04/06/2022 35  % Final   Lymphs Abs 04/06/2022 3.7  0.7 - 4.0 K/uL Final   Monocytes Relative 04/06/2022 5  % Final   Monocytes Absolute 04/06/2022 0.6  0.1 - 1.0 K/uL Final   Eosinophils Relative 04/06/2022 3  % Final   Eosinophils Absolute 04/06/2022 0.3  0.0 - 0.5 K/uL Final   Basophils Relative 04/06/2022 1  % Final   Basophils Absolute 04/06/2022 0.1  0.0 - 0.1 K/uL Final   Immature Granulocytes 04/06/2022 0  % Final   Abs Immature Granulocytes 04/06/2022 0.03  0.00 - 0.07 K/uL Final   Performed at [facility], [address] [place], [place] [address]   I-stat hCG, quantitative 04/06/2022 <5.0  <5 mIU/mL Final   Comment 3 04/06/2022          Final   Comment:   GEST. AGE      CONC.  (mIU/mL)   <=1 WEEK        5 - 50     2 WEEKS       50 - 500     3 WEEKS       100 - 10,000     4 WEEKS     1,000 - 30,000        FEMALE AND NON-PREGNANT FEMALE:     LESS THAN 5 mIU/mL   Admission on 03/26/2022, Discharged on 03/26/2022  Component Date Value Ref Range Status   Glucose, UA 03/26/2022 >=1000 (A)  NEGATIVE  mg/dL Final   Bilirubin Urine 03/26/2022 NEGATIVE  NEGATIVE Final   Ketones, ur 03/26/2022 NEGATIVE  NEGATIVE mg/dL Final   Specific Gravity, Urine 03/26/2022 >=1.030  1.005 - 1.030 Final   Hgb urine dipstick 03/26/2022 SMALL (A)  NEGATIVE Final   pH 03/26/2022 5.0  5.0 - 8.0 Final   Protein, ur 03/26/2022 30 (A)  NEGATIVE mg/dL Final   Urobilinogen, UA 03/26/2022 0.2  0.0 - 1.0 mg/dL Final   Nitrite 32/44/0102 NEGATIVE  NEGATIVE Final   Leukocytes,Ua 03/26/2022 NEGATIVE  NEGATIVE Final   Biochemical Testing Only. Please order routine urinalysis from main lab if confirmatory testing is needed.   Neisseria Gonorrhea 03/26/2022 Negative   Final   Chlamydia 03/26/2022 Negative   Final   Trichomonas 03/26/2022 Negative   Final   Bacterial Vaginitis (gardnerella) 03/26/2022 Negative   Final   Candida Vaginitis 03/26/2022 Positive (A)   Final   Candida Glabrata 03/26/2022 Negative   Final   Comment 03/26/2022 Normal Reference Range Bacterial Vaginosis - Negative   Final   Comment 03/26/2022 Normal Reference Range Candida  Species - Negative   Final   Comment 03/26/2022 Normal Reference Range Candida Galbrata - Negative   Final   Comment 03/26/2022 Normal Reference Range Trichomonas - Negative   Final   Comment 03/26/2022 Normal Reference Ranger Chlamydia - Negative   Final   Comment 03/26/2022 Normal Reference Range Neisseria Gonorrhea - Negative   Final  Admission on 03/26/2022, Discharged on 03/26/2022  Component Date Value Ref Range Status   Glucose-Capillary 03/26/2022 349 (H)  70 - 99 mg/dL Final   Glucose reference range applies only to samples taken after fasting for at least 8 hours.  Admission on 03/25/2022, Discharged on 03/25/2022  Component Date Value Ref Range Status   I-stat hCG, quantitative 03/25/2022 <5.0  <5 mIU/mL Final   Comment 3 03/25/2022          Final   Comment:   GEST. AGE      CONC.  (mIU/mL)   <=1 WEEK        5 - 50     2 WEEKS       50 - 500     3 WEEKS        100 - 10,000     4 WEEKS     1,000 - 30,000        FEMALE AND NON-PREGNANT FEMALE:     LESS THAN 5 mIU/mL    HIV Screen 4th Generation wRfx 03/25/2022 Non Reactive  Non Reactive Final   Performed at [facility], [address] [place], [place] [address]   Glucose-Capillary 03/25/2022 318 (H)  70 - 99 mg/dL Final   Glucose reference range applies only to samples taken after fasting for at least 8 hours.   Sodium 03/25/2022 138  135 - 145 mmol/L Final   Potassium 03/25/2022 3.5  3.5 - 5.1 mmol/L Final   Chloride 03/25/2022 103  98 - 111 mmol/L Final   CO2 03/25/2022 27  22 - 32 mmol/L Final   Glucose, Bld 03/25/2022 323 (H)  70 - 99 mg/dL Final   Glucose reference range applies only to samples taken after fasting for at least 8 hours.   BUN 03/25/2022 13  6 - 20 mg/dL Final   Creatinine, Ser 03/25/2022 0.82  0.44 - 1.00 mg/dL Final   Calcium 88/41/6606 8.9  8.9 - 10.3 mg/dL Final   Total Protein 30/16/0109 7.7  6.5 - 8.1 g/dL Final   Albumin 32/35/5732 4.1  3.5 - 5.0 g/dL Final   AST 20/25/4270 22  15 - 41 U/L Final   ALT 03/25/2022 22  0 - 44 U/L Final   Alkaline Phosphatase 03/25/2022 67  38 - 126 U/L Final   Total Bilirubin 03/25/2022 0.6  0.3 - 1.2 mg/dL Final   GFR, Estimated 03/25/2022 >60  >60 mL/min Final   Comment: (NOTE) Calculated using the CKD-EPI Creatinine Equation (2021)    Anion gap 03/25/2022 8  5 - 15 Final   Performed at [facility], [address] [place], [place] [address]   WBC 03/25/2022 10.7 (H)  4.0 - 10.5 K/uL Final   RBC 03/25/2022 4.66  3.87 - 5.11 MIL/uL Final   Hemoglobin 03/25/2022 11.1 (L)  12.0 - 15.0 g/dL Final   HCT 28/31/5176 36.4  36.0 - 46.0 % Final   MCV 03/25/2022 78.1 (L)  80.0 - 100.0 fL Final   MCH 03/25/2022 23.8 (L)  26.0 - 34.0 pg Final   MCHC 03/25/2022 30.5  30.0 - 36.0 g/dL Final  RDW 03/25/2022 15.6 (H)  11.5 - 15.5 % Final   Platelets 03/25/2022 279  150 - 400 K/uL Final   nRBC 03/25/2022 0.0  0.0 -  0.2 % Final   Neutrophils Relative % 03/25/2022 54  % Final   Neutro Abs 03/25/2022 5.7  1.7 - 7.7 K/uL Final   Lymphocytes Relative 03/25/2022 36  % Final   Lymphs Abs 03/25/2022 3.9  0.7 - 4.0 K/uL Final   Monocytes Relative 03/25/2022 6  % Final   Monocytes Absolute 03/25/2022 0.7  0.1 - 1.0 K/uL Final   Eosinophils Relative 03/25/2022 3  % Final   Eosinophils Absolute 03/25/2022 0.3  0.0 - 0.5 K/uL Final   Basophils Relative 03/25/2022 1  % Final   Basophils Absolute 03/25/2022 0.1  0.0 - 0.1 K/uL Final   Immature Granulocytes 03/25/2022 0  % Final   Abs Immature Granulocytes 03/25/2022 0.04  0.00 - 0.07 K/uL Final   Performed at [facility], [address], [place], [place] [address]  Admission on 03/23/2022, Discharged on 03/23/2022  Component Date Value Ref Range Status   Glucose-Capillary 03/23/2022 349 (H)  70 - 99 mg/dL Final   Glucose reference range applies only to samples taken after fasting for at least 8 hours.   WBC 03/23/2022 11.3 (H)  4.0 - 10.5 K/uL Final   RBC 03/23/2022 4.44  3.87 - 5.11 MIL/uL Final   Hemoglobin 03/23/2022 10.8 (L)  12.0 - 15.0 g/dL Final   HCT 60/45/4098 34.6 (L)  36.0 - 46.0 % Final   MCV 03/23/2022 77.9 (L)  80.0 - 100.0 fL Final   MCH 03/23/2022 24.3 (L)  26.0 - 34.0 pg Final   MCHC 03/23/2022 31.2  30.0 - 36.0 g/dL Final   RDW 11/91/4782 15.4  11.5 - 15.5 % Final   Platelets 03/23/2022 277  150 - 400 K/uL Final   nRBC 03/23/2022 0.0  0.0 - 0.2 % Final   Performed at [facility], [address], [place], [place] [address]   Sodium 03/23/2022 136  135 - 145 mmol/L Final   Potassium 03/23/2022 3.9  3.5 - 5.1 mmol/L Final   Chloride 03/23/2022 104  98 - 111 mmol/L Final   CO2 03/23/2022 22  22 - 32 mmol/L Final   Glucose, Bld 03/23/2022 337 (H)  70 - 99 mg/dL Final   Glucose reference range applies only to samples taken after fasting for at least 8 hours.   BUN 03/23/2022 12  6 - 20 mg/dL Final    Creatinine, Ser 03/23/2022 0.70  0.44 - 1.00 mg/dL Final   Calcium 30/86/5784 9.1  8.9 - 10.3 mg/dL Final   GFR, Estimated 03/23/2022 >60  >60 mL/min Final   Comment: (NOTE) Calculated using the CKD-EPI Creatinine Equation (2021)    Anion gap 03/23/2022 10  5 - 15 Final   Performed at [facility], [address], [place], [place] [address]   Color, Urine 03/23/2022 STRAW (A)  YELLOW Final   APPearance 03/23/2022 CLEAR  CLEAR Final   Specific Gravity, Urine 03/23/2022 1.022  1.005 - 1.030 Final   pH 03/23/2022 6.0  5.0 - 8.0 Final   Glucose, UA 03/23/2022 >=500 (A)  NEGATIVE mg/dL Final   Hgb urine dipstick 03/23/2022 NEGATIVE  NEGATIVE Final   Bilirubin Urine 03/23/2022 NEGATIVE  NEGATIVE Final   Ketones, ur 03/23/2022 NEGATIVE  NEGATIVE mg/dL Final   Protein, ur 52/84/1324 NEGATIVE  NEGATIVE mg/dL Final   Nitrite 40/01/2724  NEGATIVE  NEGATIVE Final   Leukocytes,Ua 03/23/2022 NEGATIVE  NEGATIVE Final   Bacteria, UA 03/23/2022 NONE SEEN  NONE SEEN Final   Squamous Epithelial / HPF 03/23/2022 0-5  0 - 5 Final   Performed at [facility], [address] [place], [place] [address]  There may be more visits with results that are not included.    Allergies: Wellbutrin [bupropion], Omnipaque [iohexol], Penicillins, Atarax [hydroxyzine], Contrast media [iodinated contrast media], and Cymbalta [duloxetine hcl]  PTA Medications: (Not in a hospital admission)   Medical Decision Making  Inpatient observation with plans for reassessment for inpatient admission   Lab Orders         Resp panel by RT-PCR (RSV, Flu A&B, Covid) Anterior Nasal Swab         POC urine preg, ED         POCT Urine Drug Screen - (I-Screen)         POC SARS Coronavirus 2 Ag         Pregnancy, urine POC      Meds ordered this encounter  Medications   acetaminophen (TYLENOL) tablet 650 mg   alum & mag hydroxide-simeth (MAALOX/MYLANTA) 200-200-20 MG/5ML suspension 30 mL    magnesium hydroxide (MILK OF MAGNESIA) suspension 30 mL   venlafaxine XR (EFFEXOR-XR) 24 hr capsule 75 mg   traZODone (DESYREL) tablet 50 mg   simvastatin (ZOCOR) tablet 40 mg   ondansetron (ZOFRAN-ODT) disintegrating tablet 8 mg   metoprolol tartrate (LOPRESSOR) tablet 12.5 mg   lithium carbonate (ESKALITH) ER tablet 450 mg   insulin aspart protamine- aspart (NOVOLOG MIX 70/30) injection 20 Units   haloperidol (HALDOL) tablet 10 mg   gabapentin (NEURONTIN) capsule 100 mg   cholecalciferol (VITAMIN D3) 25 MCG (1000 UNIT) tablet 1,000 Units   benztropine (COGENTIN) tablet 0.5 mg   amLODipine (NORVASC) tablet 10 mg     Recommendations  Based on my evaluation the patient appears to have an emergency medical condition for which I recommend the patient be transferred to the emergency department for further evaluation.  Sindy Guadeloupe, NP 04/30/22  6:09 AM

## 2022-04-29 NOTE — ED Notes (Signed)
Dash called for stat labs

## 2022-04-29 NOTE — Progress Notes (Signed)
Pt was accepted to [facility] 04/30/22; Bed Assignment [place]   Pt meets inpatient criteria per [name], NP  Attending Physician will be Dr. Flossie Buffy   Report can be called to: [phone]; PAGER number, must leave phone number to receive a phone call back  Pt can arrive after 8:00am  Care Team notified: [facility] Wynonia Hazard, RN, [name], NP, Miguel Rota, RN  Nadara Mode, [place] 04/29/2022 @ 10:49 PM

## 2022-04-29 NOTE — ED Triage Notes (Signed)
Pt presents to [facility] voluntarily, accompanied by [employer] with complaint of suicidal ideation with plan to overdose on medications. Pt reports family issues are what triggered her tonight and she called [employer] to bring her to [facility]. Pt was recently seen at [facility] for similar presentation on 04/23/22. Pt stated " I feel like demons are on me". Pt trails on to various topics, but is able to respond to questions posed during triage process. Pt denies HI, AVH and substance/alcohol use.
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
## 2022-04-29 NOTE — ED Notes (Signed)
Pt sleeping@this time. Breathing even and unlabored. Will continue to monitor for safety 

## 2022-04-29 NOTE — ED Notes (Signed)
Pt A&O x 4, no distress noted, calm & cooperative,  Presents with suicidal ideations, plan to overdose on pills.  Denies HI or AVH.  Monitoring for safety.

## 2022-04-30 DIAGNOSIS — E1165 Type 2 diabetes mellitus with hyperglycemia: Secondary | ICD-10-CM

## 2022-04-30 LAB — GLUCOSE, CAPILLARY
Glucose-Capillary: 268 mg/dL — ABNORMAL HIGH (ref 70–99)
Glucose-Capillary: 241 mg/dL — ABNORMAL HIGH (ref 70–99)

## 2022-04-30 NOTE — ED Notes (Signed)
Report given to the intake RN at [facility].  Pt was informed of pending bed at [facility] and she stated that she is refusing to go unless she is IVC'd "so the sheriff can bring me back home".   Provider made aware.

## 2022-04-30 NOTE — ED Provider Notes (Signed)
FBC/OBS ASAP Discharge Summary  Date and Time: 04/30/2022 8:52 AM  Name: [name]  MRN:  [id number]   Discharge Diagnoses:  Final diagnoses:  Visual hallucination  Psychosis, brief reactive ([place])  Suicidal ideation    Subjective: Patient states" I am ready to go to [facility], I just want to make sure the sheriff takes me so that he can bring me back, otherwise I would not have a ride back."  [name] is reassessed, face-to-face, by nurse practitioner.  She is reclined in observation area.  She is alert and oriented, pleasant and cooperative during assessment.  She presents with euthymic mood, congruent affect.  Patient continues to endorse suicidal ideation with a plan to overdose on her medications.  No homicidal ideations reported at this time.  No auditory or visual hallucinations reported at this time.  There is no evidence of delusional thought content and no indication that patient is responding to internal stimuli.  Patient offered support and encouragement.  She verbalizes agreement with plan to transfer, later this day, to [facility] for inpatient treatment and stabilization.  Stay Summary: HPI 04/29/2022-2025pm: [name],  32 y.o female with a history of schizoaffective disorder, bipolar disorder, suicide ideation most recent OD.  Presented to [facility] via [employer] voluntarily for suicidal ideation.  Per the patient she does feel like all overdosing on her medications, according to patient she cannot stay still she has been up and about all day.  Patient stated that I cannot be still I keep running the streets and kind of hyper, patient keep talking about demons is around her and she can hear them, patient keep talking about witchcraft and evil spirits.  According to patient she took her medication this morning this afternoon and this evening but she is tempted to overdose on the rest of her medications.  Patient does have a history of frequent hospitalization.      Face-to-face observation of patient, patient is alert and oriented x 4, speech is clear, maintaining eye contact.  Upon entering the room patient can be seen pacing the area, writer did ask patient to take a seat and patient was compliant.  Patient appears to be very anxious, very fidgety, mood is pleasant, patient's voice can be very loud at times and patient can be very blunt.  Patient endorsed suicidal ideation stating that she feels like overdosing on her medications.  Patient denies HI, AVH or paranoia.  Patient denies alcohol use.  According to patient she does smoke cigars on a regular basis.  According to patient she lives at home by herself.   Pt has recent labs drawn on 04/23/22.  Will not repeat these lab at this time.      Total Time spent with patient: 15 minutes  Past Psychiatric History: see above Past Medical History:  Past Medical History:  Diagnosis Date   Anxiety    Bipolar 1 disorder ([place])    Cognitive deficits    Depression    Diabetes mellitus without complication ([place])    Hypertension    Mental disorder    Mental health disorder    Obesity     Past Surgical History:  Procedure Laterality Date   CESAREAN SECTION     CESAREAN SECTION N/A 04/25/2013   Procedure: REPEAT CESAREAN SECTION;  Surgeon: [name], MD;  Location: [facility];  Service: Obstetrics;  Laterality: N/A;   MASS EXCISION N/A 06/03/2012   Procedure: EXCISION MASS;  Surgeon: [name], MD;  Location: [facility]  [place];  Service: ENT;  Laterality: N/A;  Excision uvula mass   TONSILLECTOMY N/A 06/03/2012   Procedure: TONSILLECTOMY;  Surgeon: [name], MD;  Location: [employer];  Service: ENT;  Laterality: N/A;   TONSILLECTOMY     Family History:  Family History  Problem Relation Age of Onset   Hypertension Mother    Diabetes Father    Family Psychiatric History: none reported Social History:  Social History   Substance and Sexual Activity  Alcohol Use Not  Currently     Social History   Substance and Sexual Activity  Drug Use Not Currently   Types: "Crack" cocaine, Other-see comments   Comment: Patient reports hx of smoking Crack    Social History   Socioeconomic History   Marital status: Single    Spouse name: Not on file   Number of children: Not on file   Years of education: Not on file   Highest education level: Not on file  Occupational History   Not on file  Tobacco Use   Smoking status: Every Day    Types: Cigars   Smokeless tobacco: Never   Tobacco comments:    Pt declined  Vaping Use   Vaping Use: Never used  Substance and Sexual Activity   Alcohol use: Not Currently   Drug use: Not Currently    Types: "Crack" cocaine, Other-see comments    Comment: Patient reports hx of smoking Crack   Sexual activity: Not Currently    Birth control/protection: None    Comment: occasionally  Other Topics Concern   Not on file  Social History Narrative   ** Merged History Encounter **       Social Determinants of Health   Financial Resource Strain: Not on file  Food Insecurity: No Food Insecurity (04/10/2022)   Hunger Vital Sign    Worried About Running Out of Food in the Last Year: Never true    Ran Out of Food in the Last Year: Never true  Transportation Needs: No Transportation Needs (04/10/2022)   PRAPARE - Hydrologist (Medical): No    Lack of Transportation (Non-Medical): No  Physical Activity: Not on file  Stress: Not on file  Social Connections: Not on file   SDOH:  [place]: No Food Insecurity (04/10/2022)  Housing: Low Risk  (04/10/2022)  Transportation Needs: No Transportation Needs (04/10/2022)  Utilities: Not At Risk (04/10/2022)  Alcohol Screen: Low Risk  (04/10/2022)  Depression (PHQ2-9): High Risk (04/09/2022)  Tobacco Use: High Risk (04/23/2022)    Tobacco Cessation:  N/A, patient does not currently use tobacco products  Current  Medications:  Current Facility-Administered Medications  Medication Dose Route Frequency Provider Last Rate Last Admin   acetaminophen (TYLENOL) tablet 650 mg  650 mg Oral Q6H PRN [name], NP       alum & mag hydroxide-simeth (MAALOX/MYLANTA) 200-200-20 MG/5ML suspension 30 mL  30 mL Oral Q4H PRN [name], NP       amLODipine (NORVASC) tablet 10 mg  10 mg Oral Daily [name], NP       benztropine (COGENTIN) tablet 0.5 mg  0.5 mg Oral BID [name], NP   0.5 mg at 04/30/22 0747   cholecalciferol (VITAMIN D3) 25 MCG (1000 UNIT) tablet 1,000 Units  1,000 Units Oral Daily [name], NP       gabapentin (NEURONTIN) capsule 100 mg  100 mg Oral TID [name], NP  haloperidol (HALDOL) tablet 10 mg  10 mg Oral QHS [name], NP   10 mg at 04/30/22 0056   insulin aspart protamine- aspart (NOVOLOG MIX 70/30) injection 20 Units  20 Units Subcutaneous BID WC [name], NP   20 Units at 04/30/22 0746   lithium carbonate (ESKALITH) ER tablet 450 mg  450 mg Oral QHS [name], NP   450 mg at 04/30/22 0055   magnesium hydroxide (MILK OF MAGNESIA) suspension 30 mL  30 mL Oral Daily PRN [name], NP       metoprolol tartrate (LOPRESSOR) tablet 12.5 mg  12.5 mg Oral BID [name], NP   12.5 mg at 04/30/22 0055   ondansetron (ZOFRAN-ODT) disintegrating tablet 8 mg  8 mg Oral Q8H PRN [name], NP       simvastatin (ZOCOR) tablet 40 mg  40 mg Oral QHS [name], NP   40 mg at 04/30/22 0055   traZODone (DESYREL) tablet 50 mg  50 mg Oral QHS PRN [name], NP       venlafaxine XR (EFFEXOR-XR) 24 hr capsule 75 mg  75 mg Oral Q breakfast [name], NP       Current Outpatient Medications  Medication Sig Dispense Refill   amLODipine (NORVASC) 10 MG tablet Take 10 mg by mouth daily.     benztropine (COGENTIN) 0.5 MG tablet Take 1 tablet (0.5 mg total) by mouth 2 (two) times daily. For eps 60 tablet 0   Cholecalciferol (VITAMIN D3) 25 MCG (1000 UT) CAPS  Take 1,000 Units by mouth daily.     gabapentin (NEURONTIN) 100 MG capsule Take 1 capsule (100 mg total) by mouth 3 (three) times daily. For agitation 90 capsule 0   haloperidol (HALDOL) 10 MG tablet Take 1 tablet (10 mg total) by mouth at bedtime. For mood stabilization 30 tablet 0   insulin aspart protamine- aspart (NOVOLOG MIX 70/30) (70-30) 100 UNIT/ML injection Inject 20 Units into the skin 2 (two) times daily with a meal.     lithium carbonate (ESKALITH) 450 MG ER tablet Take 1 tablet (450 mg total) by mouth at bedtime. For mood stabilization 30 tablet 0   metoprolol tartrate (LOPRESSOR) 25 MG tablet Take 12.5 mg by mouth 2 (two) times daily.     omeprazole (PRILOSEC) 20 MG capsule Take 20 mg by mouth daily.     ondansetron (ZOFRAN-ODT) 8 MG disintegrating tablet '8mg'$  ODT q4 hours prn nausea (Patient taking differently: Take 8 mg by mouth every 4 (four) hours as needed for nausea.) 10 tablet 0   simvastatin (ZOCOR) 40 MG tablet Take 40 mg by mouth at bedtime.     traZODone (DESYREL) 50 MG tablet Take 1 tablet (50 mg total) by mouth at bedtime as needed for sleep. 30 tablet 0   venlafaxine XR (EFFEXOR-XR) 75 MG 24 hr capsule Take 1 capsule (75 mg total) by mouth daily with breakfast. For depression 30 capsule 0    PTA Medications: (Not in a hospital admission)      04/09/2022    8:58 PM 01/10/2022    6:13 PM 10/24/2021   11:07 AM  Depression screen PHQ 2/9  Decreased Interest '1 2 1  '$ Down, Depressed, Hopeless '1 2 1  '$ PHQ - 2 Score '2 4 2  '$ Altered sleeping '2 2 1  '$ Tired, decreased energy '2 1 1  '$ Change in appetite 1 2 0  Feeling bad or failure about yourself  '2 2 1  '$ Trouble concentrating 1 2 1  Moving slowly or fidgety/restless 1 2 0  Suicidal thoughts '1 2 1  '$ PHQ-9 Score '12 17 7  '$ Difficult doing work/chores Somewhat difficult Very difficult Somewhat difficult    Flowsheet Row ED from 04/29/2022 in [facility] ED from 04/23/2022 in [facility] ED from 04/16/2022 in [facility]  C-SSRS RISK CATEGORY High Risk Error: Q3, 4, or 5 should not be populated when Q2 is No Error: Q3, 4, or 5 should not be populated when Q2 is No       Musculoskeletal  Strength & Muscle Tone: within normal limits Gait & Station: normal Patient leans: N/A  Psychiatric Specialty Exam  Presentation  General Appearance:  Appropriate for Environment; Casual  Eye Contact: Good  Speech: Clear and Coherent; Normal Rate  Speech Volume: Normal  Handedness: Right   Mood and Affect  Mood: Depressed  Affect: Depressed   Thought Process  Thought Processes: Coherent; Goal Directed; Linear  Descriptions of Associations:Intact  Orientation:Full (Time, Place and Person)  Thought Content:WDL  Diagnosis of Schizophrenia or Schizoaffective disorder in past: No    Hallucinations:Hallucinations: None Description of Visual Hallucinations: seeing demon and witches  Ideas of Reference:None  Suicidal Thoughts:Suicidal Thoughts: Yes, Active SI Active Intent and/or Plan: With Plan; With Intent  Homicidal Thoughts:Homicidal Thoughts: No   Sensorium  Memory: Immediate Good  Judgment: Intact  Insight: Shallow   Executive Functions  Concentration: Good  Attention Span: Good  Recall: Good  Fund of Knowledge: Fair  Language: Fair   Psychomotor Activity  Psychomotor Activity: Psychomotor Activity: Normal AIMS Completed?: No   Assets  Assets: Armed forces logistics/support/administrative officer; Housing; Social Support   Sleep  Sleep: Sleep: Good Number of Hours of Sleep: 6   Nutritional Assessment (For OBS and FBC admissions only) Has the patient had a weight loss or gain of 10 pounds or more in the last 3 months?: No Has the patient had a decrease in food intake/or appetite?: No Does the patient have dental problems?: No Does the patient have eating habits or behaviors that may be  indicators of an eating disorder including binging or inducing vomiting?: No Has the patient recently lost weight without trying?: 0 Has the patient been eating poorly because of a decreased appetite?: 0 Malnutrition Screening Tool Score: 0    Physical Exam  Physical Exam ROS Blood pressure 111/64, pulse 76, temperature 98.2 F (36.8 C), temperature source Oral, resp. rate 20, SpO2 96 %. There is no height or weight on file to calculate BMI.  Demographic Factors:  Living alone  Loss Factors: NA  Historical Factors: Prior suicide attempts and Impulsivity  Risk Reduction Factors:   Positive social support, Positive therapeutic relationship, and Positive coping skills or problem solving skills  Continued Clinical Symptoms:  More than one psychiatric diagnosis Previous Psychiatric Diagnoses and Treatments  Cognitive Features That Contribute To Risk:  None    Suicide Risk:  Moderate:  Frequent suicidal ideation with limited intensity, and duration, some specificity in terms of plans, no associated intent, good self-control, limited dysphoria/symptomatology, some risk factors present, and identifiable protective factors, including available and accessible social support.  Plan Of Care/Follow-up recommendations:  Chart reviewed and patient discussed with Dr. Hampton Abbot on 04/30/2022.  Patient continues to be criteria for inpatient psychiatric hospitalization.  Involuntary commitment petition initiated by this Probation officer.  Disposition: Patient excepted to [facility] by Dr. Christella Noa for inpatient psychiatric treatment.  Lucky Rathke, FNP 04/30/2022, 8:52 AM

## 2022-04-30 NOTE — ED Notes (Signed)
Pt resting quietly.   Breathing even and unlabored.   [facility] Pager called at this time and return # left.

## 2022-04-30 NOTE — ED Notes (Signed)
Pt sleeping at present, no distress noted.  Monitoring for safety. 

## 2022-04-30 NOTE — ED Notes (Signed)
Pt' IVC paper has been served at this time. [place] sheriff called for transport

## 2022-05-19 ENCOUNTER — Ambulatory Visit: Payer: Medicaid Other | Admitting: Podiatry

## 2022-05-25 ENCOUNTER — Ambulatory Visit: Payer: Medicaid Other | Admitting: Podiatry

## 2022-06-07 ENCOUNTER — Other Ambulatory Visit: Payer: Self-pay

## 2022-06-07 ENCOUNTER — Encounter (HOSPITAL_COMMUNITY): Payer: Self-pay

## 2022-06-07 ENCOUNTER — Emergency Department (HOSPITAL_COMMUNITY)
Admission: EM | Admit: 2022-06-07 | Discharge: 2022-06-07 | Disposition: A | Discharge status: Home / Self Care | Payer: Medicaid Other | Attending: Emergency Medicine | Admitting: Emergency Medicine

## 2022-06-07 DIAGNOSIS — Z79899 Other long term (current) drug therapy: Secondary | ICD-10-CM | POA: Insufficient documentation

## 2022-06-07 DIAGNOSIS — M79602 Pain in left arm: Secondary | ICD-10-CM | POA: Insufficient documentation

## 2022-06-07 DIAGNOSIS — Z794 Long term (current) use of insulin: Secondary | ICD-10-CM | POA: Diagnosis not present

## 2022-06-07 DIAGNOSIS — R059 Cough, unspecified: Secondary | ICD-10-CM | POA: Insufficient documentation

## 2022-06-07 DIAGNOSIS — Z7251 High risk heterosexual behavior: Secondary | ICD-10-CM

## 2022-06-07 DIAGNOSIS — F17210 Nicotine dependence, cigarettes, uncomplicated: Secondary | ICD-10-CM | POA: Insufficient documentation

## 2022-06-07 DIAGNOSIS — I10 Essential (primary) hypertension: Secondary | ICD-10-CM | POA: Insufficient documentation

## 2022-06-07 DIAGNOSIS — N898 Other specified noninflammatory disorders of vagina: Secondary | ICD-10-CM | POA: Diagnosis not present

## 2022-06-07 DIAGNOSIS — E119 Type 2 diabetes mellitus without complications: Secondary | ICD-10-CM | POA: Diagnosis not present

## 2022-06-07 DIAGNOSIS — R6889 Other general symptoms and signs: Secondary | ICD-10-CM

## 2022-06-07 LAB — WET PREP, GENITAL
Clue Cells Wet Prep HPF POC: NONE SEEN
Sperm: NONE SEEN
Trich, Wet Prep: NONE SEEN
WBC, Wet Prep HPF POC: <10 (ref <10)
Yeast Wet Prep HPF POC: NONE SEEN

## 2022-06-07 MED ORDER — DICLOFENAC SODIUM 1 % EX GEL: 2.0000 g | Freq: Four times a day (QID) | CUTANEOUS | 0 refills | Status: DC

## 2022-06-07 MED ORDER — NAPROXEN 500 MG PO TABS
500.0000 mg | ORAL_TABLET | Freq: Once | ORAL | Status: AC
  Administered 2022-06-07: 500 mg via ORAL
  Filled 2022-06-07: qty 1

## 2022-06-07 NOTE — ED Triage Notes (Signed)
Patient states that she is having arm pain, denies injury. Does not hurt to the touch but does hurt when she lays on it. She also complains about her smoker cough states she has smoked 20 packs in the last couple of weeks. Also has multiple other complaints. Wanting STD testing and many other things.

## 2022-06-07 NOTE — Discharge Instructions (Signed)
We recommend application of topical Voltaren gel for management of your left arm pain.  Follow-up with your primary care doctor to ensure improvement in your discomfort.  Your wet prep did not show evidence of bacterial vaginosis or a vaginal yeast infection.  You do have STD tests pending and will be notified if these return positive.  Do not engage in sexual intercourse until you receive the results of your STD tests.  If you test positive, you need to notify all sexual partners of their need to be tested and treated for STDs as well.  Return to the ED for any other new or concerning symptoms.

## 2022-06-07 NOTE — ED Provider Notes (Signed)
[place] EMERGENCY DEPARTMENT AT [facility] Provider Note   CSN: [id number] Arrival date & time: 06/07/22  0144     History  Chief Complaint  Patient presents with   Extremity Pain    [name] is a 32 y.o. female.  32 year old female with a history of bipolar 1 disorder, hypertension, diabetes presents to the emergency department with multiple complaints.  Her primary complaint is of left arm pain.  This has been persistent over the past few days.  She denies any trauma or injury to the left upper extremity.  Pain is sporadic, but aggravated when laying on the arm.  She has not taken any medications for her symptoms.  Denies any redness or recent, associated fever.    As an aside, she complains of a cough.  This is intermittently productive of phlegm.  She has smoked 20 packs of cigarettes in the past few weeks; known tobacco use history.  She also notes some vaginal itching without discharge.  She has had 10 sexual partners in the past 6 months and reports inconsistent use of condoms.  She denies concern for STDs.  Has not used any over-the-counter remedies for vaginitis.  The history is provided by the patient. No language interpreter was used.  Extremity Pain       Home Medications Prior to Admission medications   Medication Sig Start Date End Date Taking? Authorizing Provider  diclofenac Sodium (VOLTAREN) 1 % GEL Apply 2 g topically 4 (four) times daily. 06/07/22  Yes Antonietta Breach, PA-C  amLODipine (NORVASC) 10 MG tablet Take 10 mg by mouth daily.    [provider]  benztropine (COGENTIN) 0.5 MG tablet Take 1 tablet (0.5 mg total) by mouth 2 (two) times daily. For eps 04/14/22   Lindell Spar I, NP  Cholecalciferol (VITAMIN D3) 25 MCG (1000 UT) CAPS Take 1,000 Units by mouth daily.    [provider]  gabapentin (NEURONTIN) 100 MG capsule Take 1 capsule (100 mg total) by mouth 3 (three) times daily. For agitation 04/14/22   Lindell Spar I,  NP  haloperidol (HALDOL) 10 MG tablet Take 1 tablet (10 mg total) by mouth at bedtime. For mood stabilization 04/14/22   Nwoko, Herbert Pun I, NP  insulin aspart protamine- aspart (NOVOLOG MIX 70/30) (70-30) 100 UNIT/ML injection Inject 20 Units into the skin 2 (two) times daily with a meal.    [provider]  lithium carbonate (ESKALITH) 450 MG ER tablet Take 1 tablet (450 mg total) by mouth at bedtime. For mood stabilization 04/14/22   Lindell Spar I, NP  metoprolol tartrate (LOPRESSOR) 25 MG tablet Take 12.5 mg by mouth 2 (two) times daily.    [provider]  omeprazole (PRILOSEC) 20 MG capsule Take 20 mg by mouth daily.    [provider]  ondansetron (ZOFRAN-ODT) 8 MG disintegrating tablet 65m ODT q4 hours prn nausea Patient taking differently: Take 8 mg by mouth every 4 (four) hours as needed for nausea. 04/16/22   DVeryl Speak MD  simvastatin (ZOCOR) 40 MG tablet Take 40 mg by mouth at bedtime.    [provider]  traZODone (DESYREL) 50 MG tablet Take 1 tablet (50 mg total) by mouth at bedtime as needed for sleep. 04/14/22   NLindell SparI, NP  venlafaxine XR (EFFEXOR-XR) 75 MG 24 hr capsule Take 1 capsule (75 mg total) by mouth daily with breakfast. For depression 04/15/22   NEncarnacion Slates NP      Allergies  Wellbutrin [bupropion], Omnipaque [iohexol], Penicillins, Atarax [hydroxyzine], Contrast media [iodinated contrast media], and Cymbalta [duloxetine hcl]    Review of Systems   Review of Systems Ten systems reviewed and are negative for acute change, except as noted in the HPI.    Physical Exam Updated Vital Signs BP (!) 150/118   Pulse (!) 109   Temp 97.7 F (36.5 C)   Resp 18   Ht 5' 7"$  (1.702 m)   Wt 91.6 kg   SpO2 95%   BMI 31.64 kg/m   Physical Exam Vitals and nursing note reviewed.  Constitutional:      General: She is not in acute distress.    Appearance: She is well-developed. She is not diaphoretic.     Comments: Obese AA  female  HENT:     Head: Normocephalic and atraumatic.  Eyes:     General: No scleral icterus.    Conjunctiva/sclera: Conjunctivae normal.  Cardiovascular:     Rate and Rhythm: Normal rate and regular rhythm.     Pulses: Normal pulses.     Comments: Distal radial pulse 2+ in the LUE Pulmonary:     Effort: Pulmonary effort is normal. No respiratory distress.  Musculoskeletal:        General: Normal range of motion.     Cervical back: Normal range of motion.     Comments: Normal AROM and PROM of the LUE without crepitus, deformity, erythema, pallor. No pitting edema.  Skin:    General: Skin is warm and dry.     Coloration: Skin is not pale.     Findings: No erythema or rash.  Neurological:     Mental Status: She is alert and oriented to person, place, and time.     Coordination: Coordination normal.     Comments: Ambulatory with steady gait.  Psychiatric:        Behavior: Behavior normal.     ED Results / Procedures / Treatments   Labs (all labs ordered are listed, but only abnormal results are displayed) Labs Reviewed  WET PREP, GENITAL  GC/CHLAMYDIA PROBE AMP ([place]) NOT AT [facility]    EKG None  Radiology No results found.  Procedures Procedures    Medications Ordered in ED Medications  naproxen (NAPROSYN) tablet 500 mg (500 mg Oral Given 06/07/22 0258)    ED Course/ Medical Decision Making/ A&P                             Medical Decision Making Amount and/or Complexity of Data Reviewed Labs: ordered.  Risk Prescription drug management.   This patient presents to the ED for concern of LUE pain, this involves an extensive number of treatment options, and is a complaint that carries with it a high risk of complications and morbidity.  The differential diagnosis includes tendonitis vs sprain vs fx vs abscess   Co morbidities that complicate the patient evaluation  Bipolar d/o HTN DM   Additional history obtained:  Additional history obtained  from EMS External records from outside source obtained and reviewed including CXR in 11/2021; normal.   Lab Tests:  I Ordered, and personally interpreted labs.  The pertinent results include:  negative wet prep   Cardiac Monitoring:  The patient was maintained on a cardiac monitor.  I personally viewed and interpreted the cardiac monitored which showed an underlying rhythm of: sinus tachycardia   Medicines ordered and prescription drug management:  I ordered medication including Naproxen  for pain  Reevaluation of the patient after these medicines showed that the patient stayed the same I have reviewed the patients home medicines and have made adjustments as needed   Test Considered:  Xray LUE -however, patient neurovascularly intact without history of trauma.  No crepitus or deformity to the left upper extremity.  Problem List / ED Course:  Patient with multiple complaints.  Per chart review, she has a history of frequent ED utilization. Primarily complaining of atraumatic left upper extremity pain.  There is no overlying skin change to suggest soft tissue infection or cellulitis.  No bony deformity or crepitus.  Not on chronic steroid use and without history of cancer; doubt pathologic fracture.  There is no associated pitting or nonpitting asymmetric edema of the extremity. Patient with reports of cough, but known history of tobacco use.  She does not appear symptomatic; no tachypnea or dyspnea.  Not hypoxic while in the ED.  Cough likely related to ongoing smoking, degree of COPD. Patient also with vaginal itching. Her wet prep is negative. GC/Chlamydia pending.   Reevaluation:  After the interventions noted above, I reevaluated the patient and found that they have :stayed the same   Social Determinants of Health:  Insured patient    Dispostion:  After consideration of the diagnostic results and the patients response to treatment, I feel that the patent would benefit  from outpatient PCP follow up for recheck. Given Rx for Voltaren gel to use for any persistent extremity pain. Return precautions provided and patient discharged in stable condition with no unaddressed concerns.          Final Clinical Impression(s) / ED Diagnoses Final diagnoses:  Multiple complaints  Vaginal itching  History of unprotected sex  Left arm pain    Rx / DC Orders ED Discharge Orders          Ordered    diclofenac Sodium (VOLTAREN) 1 % GEL  4 times daily        06/07/22 0330              Antonietta Breach, PA-C 06/07/22 0415    Quintella Reichert, MD 06/07/22 0630

## 2022-06-08 LAB — GC/CHLAMYDIA PROBE AMP (~~LOC~~) NOT AT ARMC
Chlamydia: NEGATIVE
Comment: NEGATIVE
Comment: NORMAL
Neisseria Gonorrhea: POSITIVE — AB

## 2022-06-09 ENCOUNTER — Emergency Department (HOSPITAL_COMMUNITY)
Admission: EM | Admit: 2022-06-09 | Discharge: 2022-06-09 | Disposition: A | Discharge status: Home / Self Care | Payer: Medicaid Other | Attending: Emergency Medicine | Admitting: Emergency Medicine

## 2022-06-09 ENCOUNTER — Encounter (HOSPITAL_COMMUNITY): Payer: Self-pay

## 2022-06-09 ENCOUNTER — Emergency Department (HOSPITAL_COMMUNITY): Payer: Medicaid Other

## 2022-06-09 DIAGNOSIS — Z79899 Other long term (current) drug therapy: Secondary | ICD-10-CM | POA: Insufficient documentation

## 2022-06-09 DIAGNOSIS — E876 Hypokalemia: Secondary | ICD-10-CM | POA: Diagnosis not present

## 2022-06-09 DIAGNOSIS — Z794 Long term (current) use of insulin: Secondary | ICD-10-CM | POA: Insufficient documentation

## 2022-06-09 DIAGNOSIS — D72829 Elevated white blood cell count, unspecified: Secondary | ICD-10-CM | POA: Diagnosis not present

## 2022-06-09 DIAGNOSIS — R258 Other abnormal involuntary movements: Secondary | ICD-10-CM | POA: Insufficient documentation

## 2022-06-09 DIAGNOSIS — E119 Type 2 diabetes mellitus without complications: Secondary | ICD-10-CM | POA: Diagnosis not present

## 2022-06-09 DIAGNOSIS — I10 Essential (primary) hypertension: Secondary | ICD-10-CM | POA: Diagnosis not present

## 2022-06-09 DIAGNOSIS — R251 Tremor, unspecified: Secondary | ICD-10-CM

## 2022-06-09 DIAGNOSIS — D649 Anemia, unspecified: Secondary | ICD-10-CM | POA: Diagnosis not present

## 2022-06-09 LAB — BASIC METABOLIC PANEL
Anion gap: 10 (ref 5–15)
BUN: 8 mg/dL (ref 6–20)
CO2: 23 mmol/L (ref 22–32)
Calcium: 8.7 mg/dL — ABNORMAL LOW (ref 8.9–10.3)
Chloride: 104 mmol/L (ref 98–111)
Creatinine, Ser: 0.76 mg/dL (ref 0.44–1.00)
GFR, Estimated: >60 mL/min (ref >60)
Glucose, Bld: 179 mg/dL — ABNORMAL HIGH (ref 70–99)
Potassium: 3.3 mmol/L — ABNORMAL LOW (ref 3.5–5.1)
Sodium: 137 mmol/L (ref 135–145)

## 2022-06-09 LAB — CBC WITH DIFFERENTIAL/PLATELET
Abs Immature Granulocytes: 0.04 10*3/uL (ref 0.00–0.07)
Basophils Absolute: 0.1 10*3/uL (ref 0.0–0.1)
Basophils Relative: 1 %
Eosinophils Absolute: 0.3 10*3/uL (ref 0.0–0.5)
Eosinophils Relative: 3 %
HCT: 33.2 % — ABNORMAL LOW (ref 36.0–46.0)
Hemoglobin: 10.2 g/dL — ABNORMAL LOW (ref 12.0–15.0)
Immature Granulocytes: 0 %
Lymphocytes Relative: 41 %
Lymphs Abs: 4.8 10*3/uL — ABNORMAL HIGH (ref 0.7–4.0)
MCH: 24 pg — ABNORMAL LOW (ref 26.0–34.0)
MCHC: 30.7 g/dL (ref 30.0–36.0)
MCV: 78.1 fL — ABNORMAL LOW (ref 80.0–100.0)
Monocytes Absolute: 0.7 10*3/uL (ref 0.1–1.0)
Monocytes Relative: 6 %
Neutro Abs: 5.9 10*3/uL (ref 1.7–7.7)
Neutrophils Relative %: 49 %
Platelets: 261 10*3/uL (ref 150–400)
RBC: 4.25 MIL/uL (ref 3.87–5.11)
RDW: 15.9 % — ABNORMAL HIGH (ref 11.5–15.5)
WBC: 11.9 10*3/uL — ABNORMAL HIGH (ref 4.0–10.5)
nRBC: 0 % (ref 0.0–0.2)

## 2022-06-09 NOTE — ED Notes (Signed)
Pt was given a [place] sandwich with a diet coke, resting comfortably in her room

## 2022-06-09 NOTE — Discharge Instructions (Signed)
Your exam was reassuring, please follow with your primary doctor as well as neurology for further evaluation of your shaking Your potassium which was slightly low today, please follow-up with your primary doctor for reassessment.  Come back to the emergency department if you develop chest pain, shortness of breath, severe abdominal pain, uncontrolled nausea, vomiting, diarrhea.

## 2022-06-09 NOTE — ED Triage Notes (Signed)
Boyfriend called EMS for shaking, pt had no shaking on EMS arrival, medic reports she stated she wanted kill herself cause she is tired of going through this as far as psych things.

## 2022-06-09 NOTE — ED Triage Notes (Signed)
Medic vitals 150 palpated, HR 98, 97%ra, cbg of 224, rr 18

## 2022-06-09 NOTE — ED Provider Notes (Signed)
[facility] Provider Note   CSN: [id number] Arrival date & time: 06/09/22  [id number]     History  Chief Complaint  Patient presents with   Psychiatric Evaluation    [name] is a 32 y.o. female.  HPI   Medical history including anxiety, depression, bipolar, hypertension, diabetes presents with complaints of shaking.  Patient states that she woke up around 1 AM and had full body shaking, she states it was in her legs as well as her arms, she states last probably about 10 minutes,  states that she never lost consciousness, she denies any tongue biting or urinary, seizing, she states that that she feels fine at the moment.  She denies any headaches any change in vision any paresthesias or weakness of lower extremities, she has no history of seizure disorder she is never had this in the past.  She denies any illicit drug use, she denies any alcohol use.  In the triage note mention the patient was suicidal, when I asked the patient and she states that she has no suicidal or homicidal ideations.  Home Medications Prior to Admission medications   Medication Sig Start Date End Date Taking? Authorizing Provider  amLODipine (NORVASC) 10 MG tablet Take 10 mg by mouth daily.    [provider]  benztropine (COGENTIN) 0.5 MG tablet Take 1 tablet (0.5 mg total) by mouth 2 (two) times daily. For eps 04/14/22   Lindell Spar I, NP  Cholecalciferol (VITAMIN D3) 25 MCG (1000 UT) CAPS Take 1,000 Units by mouth daily.    [provider]  diclofenac Sodium (VOLTAREN) 1 % GEL Apply 2 g topically 4 (four) times daily. 06/07/22   Antonietta Breach, PA-C  gabapentin (NEURONTIN) 100 MG capsule Take 1 capsule (100 mg total) by mouth 3 (three) times daily. For agitation 04/14/22   Lindell Spar I, NP  haloperidol (HALDOL) 10 MG tablet Take 1 tablet (10 mg total) by mouth at bedtime. For mood stabilization 04/14/22   Nwoko, Herbert Pun I, NP  insulin aspart  protamine- aspart (NOVOLOG MIX 70/30) (70-30) 100 UNIT/ML injection Inject 20 Units into the skin 2 (two) times daily with a meal.    [provider]  lithium carbonate (ESKALITH) 450 MG ER tablet Take 1 tablet (450 mg total) by mouth at bedtime. For mood stabilization 04/14/22   Lindell Spar I, NP  metoprolol tartrate (LOPRESSOR) 25 MG tablet Take 12.5 mg by mouth 2 (two) times daily.    [provider]  omeprazole (PRILOSEC) 20 MG capsule Take 20 mg by mouth daily.    [provider]  ondansetron (ZOFRAN-ODT) 8 MG disintegrating tablet 54m ODT q4 hours prn nausea Patient taking differently: Take 8 mg by mouth every 4 (four) hours as needed for nausea. 04/16/22   DVeryl Speak MD  simvastatin (ZOCOR) 40 MG tablet Take 40 mg by mouth at bedtime.    [provider]  traZODone (DESYREL) 50 MG tablet Take 1 tablet (50 mg total) by mouth at bedtime as needed for sleep. 04/14/22   NLindell SparI, NP  venlafaxine XR (EFFEXOR-XR) 75 MG 24 hr capsule Take 1 capsule (75 mg total) by mouth daily with breakfast. For depression 04/15/22   NLindell SparI, NP      Allergies    Wellbutrin [bupropion], Omnipaque [iohexol], Penicillins, Atarax [hydroxyzine], Contrast media [iodinated contrast media], and Cymbalta [duloxetine hcl]    Review of Systems   Review of Systems  Constitutional:  Negative for chills and fever.  Respiratory:  Negative for shortness of breath.   Cardiovascular:  Negative for chest pain.  Gastrointestinal:  Negative for abdominal pain.  Neurological:  Negative for headaches.    Physical Exam Updated Vital Signs Temp 98.7 F (37.1 C) (Oral)   Ht 5' 7"$  (1.702 m)   Wt 91.6 kg   BMI 31.63 kg/m  Physical Exam Vitals and nursing note reviewed.  Constitutional:      General: She is not in acute distress.    Appearance: She is not ill-appearing.  HENT:     Head: Normocephalic and atraumatic.     Comments: There is no deformity of the head  present no raccoon eyes or Battle sign noted.    Nose: No congestion.     Mouth/Throat:     Mouth: Mucous membranes are moist.     Pharynx: Oropharynx is clear.     Comments: No trismus no torticollis no oral trauma present i.e. no tongue biting present. Eyes:     Conjunctiva/sclera: Conjunctivae normal.  Cardiovascular:     Rate and Rhythm: Normal rate and regular rhythm.     Pulses: Normal pulses.     Heart sounds: No murmur heard.    No friction rub. No gallop.  Pulmonary:     Effort: No respiratory distress.     Breath sounds: No wheezing, rhonchi or rales.  Abdominal:     Palpations: Abdomen is soft.     Tenderness: There is no abdominal tenderness. There is no right CVA tenderness or left CVA tenderness.  Musculoskeletal:     Comments: Spine was palpated was nontender to palpation no step-off deformities noted.  Skin:    General: Skin is warm and dry.  Neurological:     Mental Status: She is alert.     GCS: GCS eye subscore is 4. GCS verbal subscore is 5. GCS motor subscore is 6.     Cranial Nerves: Cranial nerves 2-12 are intact.     Sensory: Sensation is intact.     Motor: No weakness.     Coordination: Romberg sign negative. Finger-Nose-Finger Test normal.     Comments: Cranial nerves II through XII grossly intact no difficulty with word finding following two-step commands there is no unilateral weakness present.  Psychiatric:        Mood and Affect: Mood normal.     Comments: Denies any suicidal/ homicidal ideations, does not appear to be respond to internal stimuli, responding appropriately to all questions.     ED Results / Procedures / Treatments   Labs (all labs ordered are listed, but only abnormal results are displayed) Labs Reviewed  BASIC METABOLIC PANEL - Abnormal; Notable for the following components:      Result Value   Potassium 3.3 (*)    Glucose, Bld 179 (*)    Calcium 8.7 (*)    All other components within normal limits  CBC WITH  DIFFERENTIAL/PLATELET - Abnormal; Notable for the following components:   WBC 11.9 (*)    Hemoglobin 10.2 (*)    HCT 33.2 (*)    MCV 78.1 (*)    MCH 24.0 (*)    RDW 15.9 (*)    Lymphs Abs 4.8 (*)    All other components within normal limits  PREGNANCY, URINE  RAPID URINE DRUG SCREEN, HOSP PERFORMED    EKG EKG Interpretation  Date/Time:  Tuesday June 09 2022 03:13:12 EST Ventricular Rate:  90 PR Interval:  186 QRS Duration: 92  QT Interval:  387 QTC Calculation: 474 R Axis:   43 Text Interpretation: Sinus rhythm Confirmed by Randal Buba, April ([id number]) on 06/09/2022 3:15:41 AM  Radiology CT Head Wo Contrast  Result Date: 06/09/2022 CLINICAL DATA:  Headache, increasing frequency or severity EXAM: CT HEAD WITHOUT CONTRAST TECHNIQUE: Contiguous axial images were obtained from the base of the skull through the vertex without intravenous contrast. RADIATION DOSE REDUCTION: This exam was performed according to the departmental dose-optimization program which includes automated exposure control, adjustment of the mA and/or kV according to patient size and/or use of iterative reconstruction technique. COMPARISON:  10/23/2018 FINDINGS: Brain: Normal anatomic configuration. No abnormal intra or extra-axial mass lesion or fluid collection. No abnormal mass effect or midline shift. No evidence of acute intracranial hemorrhage or infarct. Ventricular size is normal. Cerebellum unremarkable. Vascular: Unremarkable Skull: Intact Sinuses/Orbits: Paranasal sinuses are clear. Orbits are unremarkable. Other: Mastoid air cells and middle ear cavities are clear. IMPRESSION: 1. No acute intracranial abnormality. Electronically Signed   By: Fidela Salisbury M.D.   On: 06/09/2022 03:24    Procedures Procedures    Medications Ordered in ED Medications - No data to display  ED Course/ Medical Decision Making/ A&P                             Medical Decision Making Amount and/or Complexity of Data  Reviewed Labs: ordered. Radiology: ordered.   This patient presents to the ED for concern of shaking, this involves an extensive number of treatment options, and is a complaint that carries with it a high risk of complications and morbidity.  The differential diagnosis includes seizures, metabolic derailment, withdrawals, psychiatric emergency    Additional history obtained:  Additional history obtained from N/A External records from outside source obtained and reviewed including recent ER notes   Co morbidities that complicate the patient evaluation  Psychiatric disorder  Social Determinants of Health:  N/A    Lab Tests:  I Ordered, and personally interpreted labs.  The pertinent results include: CBC shows slight leukocytosis with a white count 11.9, normocytic anemia hemoglobin 10.2, BMP reveals potassium 3.3, glucose 179, calcium 8.7   Imaging Studies ordered:  I ordered imaging studies including CT head I independently visualized and interpreted imaging which showed negative acute findings I agree with the radiologist interpretation   Cardiac Monitoring:  The patient was maintained on a cardiac monitor.  I personally viewed and interpreted the cardiac monitored which showed an underlying rhythm of: Without signs of ischemia   Medicines ordered and prescription drug management:  I ordered medication including N/A I have reviewed the patients home medicines and have made adjustments as needed  Critical Interventions:  N/A   Reevaluation:  Presents with shaking, she had a benign physical exam, unclear etiology, will obtain basic lab work imaging and reassess.   Consultations Obtained:  N/a    Test Considered:  N/a    Rule out low suspicion for CVA or intracranial head bleed as patient denies change in vision, paresthesias or weakness to upper lower extremities, no neuro deficits noted on exam, CT head did not reveal any acute findings.  I doubt  epilepsy as presentation is atypical, she is endorsing full body shaking without loss of consciousness no postictal state no urinary incontinency or tongue biting.  I doubt withdrawals as presentation is atypical she is nontremulous on my exam vital signs are reassuring denies alcohol dependency.  I doubt psychiatric emergency does  not endorse any suicidal homicidal ideations low suspicion for ACS or arrhythmias as patient denies chest pain, shortness of breath, no hypoperfusion or fluid overload on exam, EKG sinus without signs of ischemia.      Dispostion and problem list  After consideration of the diagnostic results and the patients response to treatment, I feel that the patent would benefit from discharge.  Shaking-unclear etiology, will have him follow-up with PCP for further evaluation as well as his neurology.            Final Clinical Impression(s) / ED Diagnoses Final diagnoses:  Shaking  Hypokalemia    Rx / DC Orders ED Discharge Orders     None         Marcello Fennel, PA-C 06/09/22 0440    Palumbo, April, MD 06/09/22 0502

## 2022-06-11 ENCOUNTER — Emergency Department (HOSPITAL_COMMUNITY)
Admission: EM | Admit: 2022-06-11 | Discharge: 2022-06-11 | Discharge status: Against Medical Advice | Payer: Medicaid Other | Attending: Emergency Medicine | Admitting: Emergency Medicine

## 2022-06-11 ENCOUNTER — Encounter (HOSPITAL_COMMUNITY): Payer: Self-pay

## 2022-06-11 ENCOUNTER — Other Ambulatory Visit: Payer: Self-pay

## 2022-06-11 DIAGNOSIS — R109 Unspecified abdominal pain: Secondary | ICD-10-CM | POA: Insufficient documentation

## 2022-06-11 DIAGNOSIS — Z5321 Procedure and treatment not carried out due to patient leaving prior to being seen by health care provider: Secondary | ICD-10-CM | POA: Diagnosis not present

## 2022-06-11 DIAGNOSIS — Z20822 Contact with and (suspected) exposure to covid-19: Secondary | ICD-10-CM | POA: Insufficient documentation

## 2022-06-11 DIAGNOSIS — R1110 Vomiting, unspecified: Secondary | ICD-10-CM | POA: Insufficient documentation

## 2022-06-11 LAB — COMPREHENSIVE METABOLIC PANEL
ALT: 27 U/L (ref 0–44)
AST: 34 U/L (ref 15–41)
Albumin: 4.1 g/dL (ref 3.5–5.0)
Alkaline Phosphatase: 68 U/L (ref 38–126)
Anion gap: 11 (ref 5–15)
BUN: 7 mg/dL (ref 6–20)
CO2: 26 mmol/L (ref 22–32)
Calcium: 9.8 mg/dL (ref 8.9–10.3)
Chloride: 103 mmol/L (ref 98–111)
Creatinine, Ser: 0.88 mg/dL (ref 0.44–1.00)
GFR, Estimated: >60 mL/min (ref >60)
Glucose, Bld: 127 mg/dL — ABNORMAL HIGH (ref 70–99)
Potassium: 3.6 mmol/L (ref 3.5–5.1)
Sodium: 140 mmol/L (ref 135–145)
Total Bilirubin: 0.7 mg/dL (ref 0.3–1.2)
Total Protein: 7.4 g/dL (ref 6.5–8.1)

## 2022-06-11 LAB — CBC
HCT: 35.9 % — ABNORMAL LOW (ref 36.0–46.0)
Hemoglobin: 10.9 g/dL — ABNORMAL LOW (ref 12.0–15.0)
MCH: 23.4 pg — ABNORMAL LOW (ref 26.0–34.0)
MCHC: 30.4 g/dL (ref 30.0–36.0)
MCV: 77 fL — ABNORMAL LOW (ref 80.0–100.0)
Platelets: 283 10*3/uL (ref 150–400)
RBC: 4.66 MIL/uL (ref 3.87–5.11)
RDW: 16 % — ABNORMAL HIGH (ref 11.5–15.5)
WBC: 11.6 10*3/uL — ABNORMAL HIGH (ref 4.0–10.5)
nRBC: 0 % (ref 0.0–0.2)

## 2022-06-11 LAB — LIPASE, BLOOD: Lipase: 34 U/L (ref 11–51)

## 2022-06-11 LAB — URINALYSIS, ROUTINE W REFLEX MICROSCOPIC
Bilirubin Urine: NEGATIVE
Glucose, UA: NEGATIVE mg/dL
Hgb urine dipstick: NEGATIVE
Ketones, ur: 5 mg/dL — AB
Nitrite: NEGATIVE
Protein, ur: >=300 mg/dL — AB
Specific Gravity, Urine: 1.024 (ref 1.005–1.030)
pH: 5 (ref 5.0–8.0)

## 2022-06-11 LAB — RESP PANEL BY RT-PCR (RSV, FLU A&B, COVID)  RVPGX2
Influenza A by PCR: NEGATIVE
Influenza B by PCR: NEGATIVE
Resp Syncytial Virus by PCR: NEGATIVE
SARS Coronavirus 2 by RT PCR: NEGATIVE

## 2022-06-11 LAB — PREGNANCY, URINE: Preg Test, Ur: NEGATIVE

## 2022-06-11 MED ORDER — ONDANSETRON 4 MG PO TBDP
4.0000 mg | ORAL_TABLET | Freq: Once | ORAL | Status: AC | PRN
  Administered 2022-06-11: 4 mg via ORAL
  Filled 2022-06-11: qty 1

## 2022-06-11 NOTE — ED Notes (Signed)
Pt states she is leaving because she doesn't want to miss the bus but will be back in the morning to see [place]. Pt encouraged to stay for treatment but seen ambulating out of the department

## 2022-06-11 NOTE — ED Triage Notes (Signed)
Pt arrived by EMS from home complaining of vomiting that started today. Was recently told that she has an STI and has not yet been started on antibiotics   Pt also has a family member at home who started to vomit today as well.

## 2022-06-11 NOTE — ED Provider Triage Note (Signed)
Emergency Medicine Provider Triage Evaluation Note  [name] , a 32 y.o. female  was evaluated in triage.  Pt complains of vomiting. States it started today. She states that she was having some mild abdominal pain last night. States that she was also called 2 days ago and told that her gonorrhea test was positive. She has not been treated for this. Denies dysuria. Denies fevers.   Review of Systems  Positive: See above Negative:   Physical Exam  BP (!) 143/81   Pulse 99   Temp 99.4 F (37.4 C) (Oral)   Resp 16   Ht 5' 7"$  (1.702 m)   Wt 92 kg   LMP 05/25/2022 (Approximate)   SpO2 100%   BMI 31.77 kg/m  Gen:   Awake, no distress   Resp:  Normal effort  MSK:   Moves extremities without difficulty  Other:    Medical Decision Making  Medically screening exam initiated at 8:18 PM.  Appropriate orders placed.  [name] was informed that the remainder of the evaluation will be completed by another provider, this initial triage assessment does not replace that evaluation, and the importance of remaining in the ED until their evaluation is complete.     Mickie Hillier, PA-C 06/11/22 2020

## 2022-06-15 ENCOUNTER — Encounter (HOSPITAL_COMMUNITY): Payer: Self-pay

## 2022-06-15 ENCOUNTER — Ambulatory Visit (HOSPITAL_COMMUNITY)
Admission: EM | Admit: 2022-06-15 | Discharge: 2022-06-15 | Disposition: A | Discharge status: Home / Self Care | Payer: Medicaid Other | Attending: Family Medicine | Admitting: Family Medicine

## 2022-06-15 DIAGNOSIS — R0602 Shortness of breath: Secondary | ICD-10-CM | POA: Insufficient documentation

## 2022-06-15 DIAGNOSIS — K529 Noninfective gastroenteritis and colitis, unspecified: Secondary | ICD-10-CM

## 2022-06-15 DIAGNOSIS — Z1152 Encounter for screening for COVID-19: Secondary | ICD-10-CM | POA: Insufficient documentation

## 2022-06-15 DIAGNOSIS — A549 Gonococcal infection, unspecified: Secondary | ICD-10-CM

## 2022-06-15 LAB — POC URINE PREG, ED: Preg Test, Ur: NEGATIVE

## 2022-06-15 MED ORDER — ONDANSETRON 4 MG PO TBDP
4.0000 mg | ORAL_TABLET | Freq: Once | ORAL | Status: AC
  Administered 2022-06-15: 4 mg via ORAL

## 2022-06-15 MED ORDER — CEFTRIAXONE SODIUM 500 MG IJ SOLR
INTRAMUSCULAR | Status: AC
  Filled 2022-06-15: qty 500

## 2022-06-15 MED ORDER — ONDANSETRON 4 MG PO TBDP
ORAL_TABLET | ORAL | Status: AC
  Filled 2022-06-15: qty 1

## 2022-06-15 MED ORDER — LIDOCAINE HCL (PF) 1 % IJ SOLN
INTRAMUSCULAR | Status: AC
  Filled 2022-06-15: qty 2

## 2022-06-15 MED ORDER — ONDANSETRON 4 MG PO TBDP: 4.0000 mg | ORAL_TABLET | Freq: Three times a day (TID) | ORAL | 0 refills | Status: DC | PRN

## 2022-06-15 MED ORDER — CEFTRIAXONE SODIUM 500 MG IJ SOLR
500.0000 mg | INTRAMUSCULAR | Status: DC
  Administered 2022-06-15: 500 mg via INTRAMUSCULAR

## 2022-06-15 NOTE — ED Provider Notes (Signed)
[facility]    CSN: [id number] Arrival date & time: 06/15/22  1514      History   Chief Complaint Chief Complaint  Patient presents with   S74.5   wants pregnancy test   Shortness of Breath    HPI [name] is a 32 y.o. female.    Shortness of Breath  Here for several complaints.  4 1 she tested positive for gonorrhea on February 11, when she was seen in the emergency room.  She has not had treatment yet chlamydia was negative.  She does have a milky white vaginal discharge.  She has had nausea and vomiting for the last 3 days.  Today she threw up once, we have heard her dry heaves several times in the office while she was waiting to be seen.  Last menstrual period was January 29, and to request a pregnancy test today.  No fever noted  She is also had 3 days of feeling short of breath.  She coughs and has some congestion "sometimes".  Of note viral testing in the emergency room was negative on February 15   Past Medical History:  Diagnosis Date   Anxiety    Bipolar 1 disorder ([place])    Cognitive deficits    Depression    Diabetes mellitus without complication ([place])    Hypertension    Mental disorder    Mental health disorder    Obesity     Patient Active Problem List   Diagnosis Date Noted   Suicidal ideations 01/04/2022   Suicidal overdose ([place]) 03/23/2021   Syphilis 07/15/2020   Malingering 06/05/2020   Gastroesophageal reflux disease 05/04/2020   Hyperglycemia due to type 2 diabetes mellitus ([place]) 05/04/2020   Long term (current) use of insulin ([place]) 05/04/2020   Migraine without aura 05/04/2020   Morbid obesity ([place]) 05/04/2020   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 2 diabetes mellitus ([place]) 05/04/2020   Prolapsed internal hemorrhoids 05/04/2020   Vitamin D deficiency 05/04/2020   Other symptoms and signs involving cognitive functions and awareness 05/04/2020   Suicide attempt ([place])    Anxiety state 03/06/2020   Schizophrenia ([place])  09/13/2019   Bipolar I disorder, most recent episode depressed ([place]) 06/23/2019   MDD (major depressive disorder) 10/10/2018   Schizoaffective disorder, bipolar type ([place]) 09/25/2018   Affective psychosis, bipolar ([place]) 06/13/2018   HTN (hypertension) 05/03/2018   Tobacco use disorder 05/03/2018   Adjustment disorder with emotional disturbance 01/02/2018   Schizophrenia, 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   Hyperlipidemia 07/18/2015   Severe episode of recurrent major depressive disorder, without psychotic features ([place])    Suicidal ideation    Intentional overdose ([place])    Cognitive deficits 10/12/2012   Generalized anxiety disorder 06/28/2012    Past Surgical History:  Procedure Laterality Date   CESAREAN SECTION     CESAREAN SECTION N/A 04/25/2013   Procedure: REPEAT CESAREAN SECTION;  Surgeon: [name], MD;  Location: [facility];  Service: Obstetrics;  Laterality: N/A;  MASS EXCISION N/A 06/03/2012   Procedure: EXCISION MASS;  Surgeon: [name], MD;  Location: [facility];  Service: ENT;  Laterality: N/A;  Excision uvula mass   TONSILLECTOMY N/A 06/03/2012   Procedure: TONSILLECTOMY;  Surgeon: [name], MD;  Location: [facility];  Service: ENT;  Laterality: N/A;   TONSILLECTOMY      OB History     Gravida  3   Para  3   Term  3   Preterm  0   AB  0   Living  3       SAB  0   IAB  0   Ectopic  0   Multiple      Live Births  3            Home Medications    Prior to Admission medications   Medication Sig Start Date End Date Taking? Authorizing Provider  ondansetron (ZOFRAN-ODT) 4 MG disintegrating tablet Take 1 tablet (4 mg total) by mouth every 8 (eight) hours as needed for nausea or vomiting. 06/15/22  Yes Barrett Henle, MD  amLODipine (NORVASC) 10 MG tablet Take 10 mg by mouth daily.    [provider]  benztropine (COGENTIN) 0.5 MG tablet Take 1 tablet (0.5 mg total) by mouth 2 (two) times daily. For eps 04/14/22   Lindell Spar I, NP  Cholecalciferol (VITAMIN D3) 25 MCG (1000 UT) CAPS Take 1,000 Units by mouth daily.    [provider]  diclofenac Sodium (VOLTAREN) 1 % GEL Apply 2 g topically 4 (four) times daily. 06/07/22   Antonietta Breach, PA-C  gabapentin (NEURONTIN) 100 MG capsule Take 1 capsule (100 mg total) by mouth 3 (three) times daily. For agitation 04/14/22   Lindell Spar I, NP  haloperidol (HALDOL) 10 MG tablet Take 1 tablet (10 mg total) by mouth at bedtime. For mood stabilization 04/14/22   Nwoko, Herbert Pun I, NP  insulin aspart protamine- aspart (NOVOLOG MIX 70/30) (70-30) 100 UNIT/ML injection Inject 20 Units into the skin 2 (two) times daily with a meal.    [provider]  lithium carbonate (ESKALITH) 450 MG ER tablet Take 1 tablet (450 mg total) by mouth at bedtime. For mood stabilization 04/14/22   Lindell Spar I, NP  metoprolol tartrate (LOPRESSOR) 25 MG tablet Take 12.5 mg by mouth 2 (two) times daily.    [provider]  omeprazole (PRILOSEC) 20 MG capsule Take 20 mg by mouth daily.    [provider]  simvastatin (ZOCOR) 40 MG tablet Take 40 mg by mouth at bedtime.    [provider]  traZODone (DESYREL) 50 MG tablet Take 1 tablet (50 mg total) by mouth at bedtime as needed for sleep. 04/14/22   Lindell Spar I, NP  venlafaxine XR (EFFEXOR-XR) 75 MG 24 hr capsule  Take 1 capsule (75 mg total) by mouth daily with breakfast. For depression 04/15/22   Encarnacion Slates, NP    Family History Family History  Problem Relation Age of Onset   Hypertension Mother    Diabetes Father     Social History Social History   Tobacco Use   Smoking status: Every Day    Types: Cigars   Smokeless tobacco: Never   Tobacco comments:    Pt declined  Vaping Use   Vaping Use: Never used  Substance Use Topics   Alcohol use: Not Currently   Drug use: Not Currently    Types: "Crack" cocaine, Other-see comments  Comment: Patient reports hx of smoking Crack     Allergies   Wellbutrin [bupropion], Omnipaque [iohexol], Penicillins, Atarax [hydroxyzine], Contrast media [iodinated contrast media], and Cymbalta [duloxetine hcl]   Review of Systems Review of Systems  Respiratory:  Positive for shortness of breath.      Physical Exam Triage Vital Signs ED Triage Vitals  Enc Vitals Group     BP 06/15/22 1629 138/84     Pulse Rate 06/15/22 1629 96     Resp 06/15/22 1629 18     Temp 06/15/22 1629 98.5 F (36.9 C)     Temp Source 06/15/22 1629 Oral     SpO2 06/15/22 1629 96 %     Weight --      Height --      Head Circumference --      Peak Flow --      Pain Score 06/15/22 1631 0     Pain Loc --      Pain Edu? --      Excl. in [place]? --    No data found.  Updated Vital Signs BP 138/84 (BP Location: Left Arm)   Pulse 96   Temp 98.5 F (36.9 C) (Oral)   Resp 18   LMP 05/25/2022 (Approximate)   SpO2 96%   Visual Acuity Right Eye Distance:   Left Eye Distance:   Bilateral Distance:    Right Eye Near:   Left Eye Near:    Bilateral Near:     Physical Exam Vitals reviewed.  Constitutional:      General: She is not in acute distress.    Appearance: She is not ill-appearing, toxic-appearing or diaphoretic.  HENT:     Nose: Nose normal.     Mouth/Throat:     Mouth: Mucous membranes are moist.     Pharynx: No oropharyngeal exudate or posterior  oropharyngeal erythema.  Eyes:     Extraocular Movements: Extraocular movements intact.     Conjunctiva/sclera: Conjunctivae normal.     Pupils: Pupils are equal, round, and reactive to light.  Cardiovascular:     Rate and Rhythm: Normal rate and regular rhythm.     Heart sounds: No murmur heard. Pulmonary:     Effort: Pulmonary effort is normal. No respiratory distress.     Breath sounds: No stridor. No wheezing, rhonchi or rales.  Abdominal:     Palpations: Abdomen is soft.     Tenderness: There is no abdominal tenderness.  Musculoskeletal:     Cervical back: Neck supple.  Lymphadenopathy:     Cervical: No cervical adenopathy.  Skin:    Capillary Refill: Capillary refill takes less than 2 seconds.     Coloration: Skin is not jaundiced or pale.  Neurological:     General: No focal deficit present.     Mental Status: She is alert and oriented to person, place, and time.  Psychiatric:        Behavior: Behavior normal.      UC Treatments / Results  Labs (all labs ordered are listed, but only abnormal results are displayed) Labs Reviewed  SARS CORONAVIRUS 2 (TAT 6-24 HRS)  POC URINE PREG, ED    EKG   Radiology No results found.  Procedures Procedures (including critical care time)  Medications Ordered in UC Medications  cefTRIAXone (ROCEPHIN) injection 500 mg (has no administration in time range)  ondansetron (ZOFRAN-ODT) disintegrating tablet 4 mg (has no administration in time range)    Initial Impression / Assessment and Plan /  UC Course  I have reviewed the triage vital signs and the nursing notes.  Pertinent labs & imaging results that were available during my care of the patient were reviewed by me and considered in my medical decision making (see chart for details).        UPT is negative.  She is given an injection of ceftriaxone to treat the gonorrhea.  Her lungs are clear and vital signs are reassuring.  I do not think that she needs any imaging  of her chest.  Zofran is given for the nausea and COVID swab is done.  If COVID is positive, she is a candidate for Slo-Bid, as her last EGFR was greater than 60 earlier this month. Final Clinical Impressions(s) / UC Diagnoses   Final diagnoses:  SOB (shortness of breath)  Gonorrhea  Gastroenteritis     Discharge Instructions      Your pregnancy test was negative.  You have been given a shot of ceftriaxone 500 mg; this is to treat the positive gonorrhea test.   You have been swabbed for COVID, and the test will result in the next 24 hours. Our staff will call you if positive. If the COVID test is positive, you should quarantine for 5 days from the start of your symptoms.  On days 6-10 from the start of your illness, you should wear a mask if out in public.  Ondansetron dissolved in the mouth every 8 hours as needed for nausea or vomiting. Clear liquids and bland things to eat. Avoid acidic foods like lemon/lime/orange/tomato. You were given 1 dose of this medication here in the clinic at about 5:30 PM     ED Prescriptions     Medication Sig Dispense Auth. Provider   ondansetron (ZOFRAN-ODT) 4 MG disintegrating tablet Take 1 tablet (4 mg total) by mouth every 8 (eight) hours as needed for nausea or vomiting. 10 tablet Windy Carina Gwenlyn Perking, MD      PDMP not reviewed this encounter.   Barrett Henle, MD 06/15/22 [phone]

## 2022-06-15 NOTE — ED Triage Notes (Signed)
Patient states she tested positive for gonorrhea approx 2 weeks ago and has not had any treatment. Patient states she is having a "milky " vaginal discharge.  Patient is also requesting a pregnancy test.  Patient states she has had SOB x 3 days. Patient denies any fever.

## 2022-06-15 NOTE — Discharge Instructions (Signed)
Your pregnancy test was negative.  You have been given a shot of ceftriaxone 500 mg; this is to treat the positive gonorrhea test.   You have been swabbed for COVID, and the test will result in the next 24 hours. Our staff will call you if positive. If the COVID test is positive, you should quarantine for 5 days from the start of your symptoms.  On days 6-10 from the start of your illness, you should wear a mask if out in public.  Ondansetron dissolved in the mouth every 8 hours as needed for nausea or vomiting. Clear liquids and bland things to eat. Avoid acidic foods like lemon/lime/orange/tomato. You were given 1 dose of this medication here in the clinic at about 5:30 PM

## 2022-06-16 LAB — SARS CORONAVIRUS 2 (TAT 6-24 HRS): SARS Coronavirus 2: NEGATIVE

## 2022-06-19 ENCOUNTER — Emergency Department (HOSPITAL_COMMUNITY)
Admission: EM | Admit: 2022-06-19 | Discharge: 2022-06-20 | Disposition: A | Discharge status: Other Acute Inpatient Hospital | Payer: Medicaid Other | Attending: Emergency Medicine | Admitting: Emergency Medicine

## 2022-06-19 DIAGNOSIS — F22 Delusional disorders: Secondary | ICD-10-CM | POA: Insufficient documentation

## 2022-06-19 DIAGNOSIS — F250 Schizoaffective disorder, bipolar type: Secondary | ICD-10-CM | POA: Insufficient documentation

## 2022-06-19 DIAGNOSIS — Z79899 Other long term (current) drug therapy: Secondary | ICD-10-CM | POA: Insufficient documentation

## 2022-06-19 DIAGNOSIS — R451 Restlessness and agitation: Secondary | ICD-10-CM | POA: Diagnosis present

## 2022-06-19 DIAGNOSIS — R45851 Suicidal ideations: Secondary | ICD-10-CM | POA: Diagnosis not present

## 2022-06-19 DIAGNOSIS — F29 Unspecified psychosis not due to a substance or known physiological condition: Secondary | ICD-10-CM | POA: Insufficient documentation

## 2022-06-19 DIAGNOSIS — F411 Generalized anxiety disorder: Secondary | ICD-10-CM | POA: Diagnosis present

## 2022-06-19 DIAGNOSIS — F332 Major depressive disorder, recurrent severe without psychotic features: Secondary | ICD-10-CM | POA: Diagnosis present

## 2022-06-19 DIAGNOSIS — Z794 Long term (current) use of insulin: Secondary | ICD-10-CM | POA: Diagnosis not present

## 2022-06-19 DIAGNOSIS — Z20822 Contact with and (suspected) exposure to covid-19: Secondary | ICD-10-CM | POA: Insufficient documentation

## 2022-06-19 LAB — RAPID URINE DRUG SCREEN, HOSP PERFORMED
Amphetamines: NOT DETECTED
Barbiturates: NOT DETECTED
Benzodiazepines: NOT DETECTED
Cocaine: NOT DETECTED
Opiates: NOT DETECTED
Tetrahydrocannabinol: NOT DETECTED

## 2022-06-19 LAB — COMPREHENSIVE METABOLIC PANEL
ALT: 22 U/L (ref 0–44)
AST: 31 U/L (ref 15–41)
Albumin: 4.1 g/dL (ref 3.5–5.0)
Alkaline Phosphatase: 62 U/L (ref 38–126)
Anion gap: 5 (ref 5–15)
BUN: 9 mg/dL (ref 6–20)
CO2: 23 mmol/L (ref 22–32)
Calcium: 8.8 mg/dL — ABNORMAL LOW (ref 8.9–10.3)
Chloride: 106 mmol/L (ref 98–111)
Creatinine, Ser: 1.02 mg/dL — ABNORMAL HIGH (ref 0.44–1.00)
GFR, Estimated: >60 mL/min (ref >60)
Glucose, Bld: 234 mg/dL — ABNORMAL HIGH (ref 70–99)
Potassium: 3.4 mmol/L — ABNORMAL LOW (ref 3.5–5.1)
Sodium: 134 mmol/L — ABNORMAL LOW (ref 135–145)
Total Bilirubin: 0.4 mg/dL (ref 0.3–1.2)
Total Protein: 8 g/dL (ref 6.5–8.1)

## 2022-06-19 LAB — CBC WITH DIFFERENTIAL/PLATELET
Abs Immature Granulocytes: 0.03 10*3/uL (ref 0.00–0.07)
Basophils Absolute: 0.1 10*3/uL (ref 0.0–0.1)
Basophils Relative: 1 %
Eosinophils Absolute: 0.3 10*3/uL (ref 0.0–0.5)
Eosinophils Relative: 2 %
HCT: 34.5 % — ABNORMAL LOW (ref 36.0–46.0)
Hemoglobin: 10.6 g/dL — ABNORMAL LOW (ref 12.0–15.0)
Immature Granulocytes: 0 %
Lymphocytes Relative: 35 %
Lymphs Abs: 4.2 10*3/uL — ABNORMAL HIGH (ref 0.7–4.0)
MCH: 24.1 pg — ABNORMAL LOW (ref 26.0–34.0)
MCHC: 30.7 g/dL (ref 30.0–36.0)
MCV: 78.6 fL — ABNORMAL LOW (ref 80.0–100.0)
Monocytes Absolute: 0.8 10*3/uL (ref 0.1–1.0)
Monocytes Relative: 6 %
Neutro Abs: 6.9 10*3/uL (ref 1.7–7.7)
Neutrophils Relative %: 56 %
Platelets: 247 10*3/uL (ref 150–400)
RBC: 4.39 MIL/uL (ref 3.87–5.11)
RDW: 16.2 % — ABNORMAL HIGH (ref 11.5–15.5)
WBC: 12.2 10*3/uL — ABNORMAL HIGH (ref 4.0–10.5)
nRBC: 0 % (ref 0.0–0.2)

## 2022-06-19 LAB — ETHANOL: Alcohol, Ethyl (B): <10 mg/dL (ref <10)

## 2022-06-19 LAB — ACETAMINOPHEN LEVEL: Acetaminophen (Tylenol), Serum: <10 ug/mL — ABNORMAL LOW (ref 10–30)

## 2022-06-19 LAB — SALICYLATE LEVEL: Salicylate Lvl: <7 mg/dL — ABNORMAL LOW (ref 7.0–30.0)

## 2022-06-19 MED ORDER — STERILE WATER FOR INJECTION IJ SOLN
INTRAMUSCULAR | Status: AC
  Filled 2022-06-19: qty 10

## 2022-06-19 MED ORDER — ZIPRASIDONE MESYLATE 20 MG IM SOLR
20.0000 mg | Freq: Once | INTRAMUSCULAR | Status: DC
  Filled 2022-06-19: qty 20

## 2022-06-19 MED ORDER — MIDAZOLAM HCL 2 MG/2ML IJ SOLN
2.0000 mg | Freq: Once | INTRAMUSCULAR | Status: DC
  Filled 2022-06-19: qty 2

## 2022-06-19 NOTE — ED Triage Notes (Signed)
Pt voluntary

## 2022-06-19 NOTE — ED Provider Notes (Signed)
[facility] Provider Note   CSN: [id number] Arrival date & time: 06/19/22  2132     History  Chief Complaint  Patient presents with   Psychiatric Evaluation    [name] is a 33 y.o. female.  32 yo  F with a chief complaints that the demons are after her.  She tells me that this has been going on for some time but she has trouble describing exactly how long it has been.  When asked if she been taking her medicine she said that she has but people told her not to swallow them all.  When asked her to clarify that she became angry at me and told me that I was not taking her seriously.  She seems easily distracted and was unable to provide any other history.        Home Medications Prior to Admission medications   Medication Sig Start Date End Date Taking? Authorizing Provider  amLODipine (NORVASC) 10 MG tablet Take 10 mg by mouth daily.    [provider]  benztropine (COGENTIN) 0.5 MG tablet Take 1 tablet (0.5 mg total) by mouth 2 (two) times daily. For eps 04/14/22   Lindell Spar I, NP  Cholecalciferol (VITAMIN D3) 25 MCG (1000 UT) CAPS Take 1,000 Units by mouth daily.    [provider]  diclofenac Sodium (VOLTAREN) 1 % GEL Apply 2 g topically 4 (four) times daily. 06/07/22   Antonietta Breach, PA-C  gabapentin (NEURONTIN) 100 MG capsule Take 1 capsule (100 mg total) by mouth 3 (three) times daily. For agitation 04/14/22   Lindell Spar I, NP  haloperidol (HALDOL) 10 MG tablet Take 1 tablet (10 mg total) by mouth at bedtime. For mood stabilization 04/14/22   Nwoko, Herbert Pun I, NP  insulin aspart protamine- aspart (NOVOLOG MIX 70/30) (70-30) 100 UNIT/ML injection Inject 20 Units into the skin 2 (two) times daily with a meal.    [provider]  lithium carbonate (ESKALITH) 450 MG ER tablet Take 1 tablet (450 mg total) by mouth at bedtime. For mood stabilization 04/14/22   Lindell Spar I, NP  metoprolol tartrate  (LOPRESSOR) 25 MG tablet Take 12.5 mg by mouth 2 (two) times daily.    [provider]  omeprazole (PRILOSEC) 20 MG capsule Take 20 mg by mouth daily.    [provider]  ondansetron (ZOFRAN-ODT) 4 MG disintegrating tablet Take 1 tablet (4 mg total) by mouth every 8 (eight) hours as needed for nausea or vomiting. 06/15/22   Barrett Henle, MD  simvastatin (ZOCOR) 40 MG tablet Take 40 mg by mouth at bedtime.    [provider]  traZODone (DESYREL) 50 MG tablet Take 1 tablet (50 mg total) by mouth at bedtime as needed for sleep. 04/14/22   Lindell Spar I, NP  venlafaxine XR (EFFEXOR-XR) 75 MG 24 hr capsule Take 1 capsule (75 mg total) by mouth daily with breakfast. For depression 04/15/22   Lindell Spar I, NP      Allergies    Wellbutrin [bupropion], Omnipaque [iohexol], Penicillins, Atarax [hydroxyzine], Contrast media [iodinated contrast media], and Cymbalta [duloxetine hcl]    Review of Systems   Review of Systems  Physical Exam Updated Vital Signs BP (!) 157/87   LMP 05/25/2022 (Approximate)   SpO2 100%  Physical Exam Vitals and nursing note reviewed.  Constitutional:      General: She is not in acute distress.    Appearance: She is well-developed. She  is not diaphoretic.  HENT:     Head: Normocephalic and atraumatic.  Eyes:     Pupils: Pupils are equal, round, and reactive to light.  Cardiovascular:     Rate and Rhythm: Normal rate and regular rhythm.     Heart sounds: No murmur heard.    No friction rub. No gallop.  Pulmonary:     Effort: Pulmonary effort is normal.     Breath sounds: No wheezing or rales.  Abdominal:     General: There is no distension.     Palpations: Abdomen is soft.     Tenderness: There is no abdominal tenderness.  Musculoskeletal:        General: No tenderness.     Cervical back: Normal range of motion and neck supple.  Skin:    General: Skin is warm and dry.  Neurological:     Mental Status: She is alert and  oriented to person, place, and time.  Psychiatric:        Behavior: Behavior is agitated.        Thought Content: Thought content is paranoid and delusional.     ED Results / Procedures / Treatments   Labs (all labs ordered are listed, but only abnormal results are displayed) Labs Reviewed  COMPREHENSIVE METABOLIC PANEL  ETHANOL  RAPID URINE DRUG SCREEN, HOSP PERFORMED  CBC WITH DIFFERENTIAL/PLATELET  SALICYLATE LEVEL  ACETAMINOPHEN LEVEL  I-STAT BETA HCG BLOOD, ED ([facility], [facility], [facility] ONLY)    EKG EKG Interpretation  Date/Time:  Friday June 19 2022 22:22:50 EST Ventricular Rate:  90 PR Interval:  175 QRS Duration: 89 QT Interval:  367 QTC Calculation: 449 R Axis:   74 Text Interpretation: Sinus rhythm No significant change since last tracing Confirmed by Deno Etienne [phone]) on 06/19/2022 10:39:23 PM  Radiology No results found.  Procedures Procedures    Medications Ordered in ED Medications  ziprasidone (GEODON) injection 20 mg (has no administration in time range)  midazolam (VERSED) injection 2 mg (has no administration in time range)  sterile water (preservative free) injection (has no administration in time range)    ED Course/ Medical Decision Making/ A&P                             Medical Decision Making Amount and/or Complexity of Data Reviewed Labs: ordered.  Risk Prescription drug management.   32 yo F with a chief complaints of the demons after her.  Patient is agitated and threatening staff.  Will chemically sedate.  Seems most consistent with mental illness.  Will have TTS evaluate.  Awaiting blood work, Patient care signed out to Dr. Ralene Bathe, Please see their note for further details of care in the ED.  The patients results and plan were reviewed and discussed.   Any x-rays performed were independently reviewed by myself.   Differential diagnosis were considered with the presenting HPI.  Medications  ziprasidone (GEODON) injection 20 mg (has no  administration in time range)  midazolam (VERSED) injection 2 mg (has no administration in time range)  sterile water (preservative free) injection (has no administration in time range)    Vitals:   06/19/22 2151  BP: (!) 157/87  SpO2: 100%    Final diagnoses:  Psychosis, unspecified psychosis type ([place])    Admission/ observation were discussed with the admitting physician, patient and/or family and they are comfortable with the plan.          Final Clinical  Impression(s) / ED Diagnoses Final diagnoses:  Psychosis, unspecified psychosis type [facility])    Rx / [place] Orders ED Discharge Orders     None         Deno Etienne, DO 06/19/22 2305

## 2022-06-19 NOTE — ED Triage Notes (Signed)
Pt BIB [facility]. Pt requested to come in to be evaluated. Pt reports seeing demons/ hallucinations, and suicidal thoughts.

## 2022-06-20 ENCOUNTER — Encounter (HOSPITAL_COMMUNITY): Payer: Self-pay | Admitting: Emergency Medicine

## 2022-06-20 ENCOUNTER — Other Ambulatory Visit: Payer: Self-pay

## 2022-06-20 DIAGNOSIS — R45851 Suicidal ideations: Secondary | ICD-10-CM

## 2022-06-20 LAB — RESP PANEL BY RT-PCR (RSV, FLU A&B, COVID)  RVPGX2
Influenza A by PCR: NEGATIVE
Influenza B by PCR: NEGATIVE
Resp Syncytial Virus by PCR: NEGATIVE
SARS Coronavirus 2 by RT PCR: NEGATIVE

## 2022-06-20 LAB — LITHIUM LEVEL: Lithium Lvl: 0.36 mmol/L — ABNORMAL LOW (ref 0.60–1.20)

## 2022-06-20 MED ORDER — SIMVASTATIN 20 MG PO TABS: 40.0000 mg | ORAL_TABLET | Freq: Every day | ORAL | Status: DC

## 2022-06-20 MED ORDER — BENZTROPINE MESYLATE 0.5 MG PO TABS
0.5000 mg | ORAL_TABLET | Freq: Two times a day (BID) | ORAL | Status: DC
  Administered 2022-06-20 (×2): 0.5 mg via ORAL
  Filled 2022-06-20 (×2): qty 1

## 2022-06-20 MED ORDER — INSULIN ASPART PROT & ASPART (70-30 MIX) 100 UNIT/ML ~~LOC~~ SUSP
20.0000 [IU] | Freq: Two times a day (BID) | SUBCUTANEOUS | Status: DC
  Administered 2022-06-20 (×2): 20 [IU] via SUBCUTANEOUS
  Filled 2022-06-20: qty 10

## 2022-06-20 MED ORDER — DICLOFENAC SODIUM 1 % EX GEL
2.0000 g | Freq: Four times a day (QID) | CUTANEOUS | Status: DC
  Administered 2022-06-20 (×3): 2 g via TOPICAL
  Filled 2022-06-20: qty 100

## 2022-06-20 MED ORDER — ONDANSETRON 4 MG PO TBDP: 4.0000 mg | ORAL_TABLET | Freq: Three times a day (TID) | ORAL | Status: DC | PRN

## 2022-06-20 MED ORDER — ATORVASTATIN CALCIUM 10 MG PO TABS
20.0000 mg | ORAL_TABLET | Freq: Every day | ORAL | Status: DC
  Administered 2022-06-20: 20 mg via ORAL
  Filled 2022-06-20: qty 2

## 2022-06-20 MED ORDER — PANTOPRAZOLE SODIUM 40 MG PO TBEC
40.0000 mg | DELAYED_RELEASE_TABLET | Freq: Every day | ORAL | Status: DC
  Administered 2022-06-20: 40 mg via ORAL
  Filled 2022-06-20: qty 1

## 2022-06-20 MED ORDER — VITAMIN D 25 MCG (1000 UNIT) PO TABS
1000.0000 [IU] | ORAL_TABLET | Freq: Every day | ORAL | Status: DC
  Administered 2022-06-20: 1000 [IU] via ORAL
  Filled 2022-06-20: qty 1

## 2022-06-20 MED ORDER — HALOPERIDOL 5 MG PO TABS
10.0000 mg | ORAL_TABLET | Freq: Every day | ORAL | Status: DC
  Administered 2022-06-20: 10 mg via ORAL
  Filled 2022-06-20: qty 2

## 2022-06-20 MED ORDER — METOPROLOL TARTRATE 25 MG PO TABS
12.5000 mg | ORAL_TABLET | Freq: Two times a day (BID) | ORAL | Status: DC
  Administered 2022-06-20 (×2): 12.5 mg via ORAL
  Filled 2022-06-20 (×2): qty 1

## 2022-06-20 MED ORDER — VENLAFAXINE HCL ER 75 MG PO CP24
75.0000 mg | ORAL_CAPSULE | Freq: Every day | ORAL | Status: DC
  Administered 2022-06-20: 75 mg via ORAL
  Filled 2022-06-20: qty 1

## 2022-06-20 MED ORDER — AMLODIPINE BESYLATE 5 MG PO TABS
10.0000 mg | ORAL_TABLET | Freq: Every day | ORAL | Status: DC
  Administered 2022-06-20: 10 mg via ORAL
  Filled 2022-06-20: qty 2

## 2022-06-20 MED ORDER — LITHIUM CARBONATE ER 450 MG PO TBCR
450.0000 mg | EXTENDED_RELEASE_TABLET | Freq: Every day | ORAL | Status: DC
  Administered 2022-06-20: 450 mg via ORAL
  Filled 2022-06-20: qty 1

## 2022-06-20 MED ORDER — GABAPENTIN 100 MG PO CAPS
100.0000 mg | ORAL_CAPSULE | Freq: Three times a day (TID) | ORAL | Status: DC
  Administered 2022-06-20 (×3): 100 mg via ORAL
  Filled 2022-06-20 (×3): qty 1

## 2022-06-20 NOTE — ED Notes (Signed)
Report received, care of pt assumed.  Pt resting quietly in room.  NAD noted, Sitter and security on unit at this time.  Will monitor.

## 2022-06-20 NOTE — ED Notes (Signed)
Patient cool calm and cooperative at this time. Eating snack.

## 2022-06-20 NOTE — Progress Notes (Signed)
Inpatient Behavioral Health Placement  Pt meets inpatient criteria perVeronique Maple Hudson, NP. There are no available beds at [facility] per [place], RN. Referral was sent to the following facilities;    Destination  Service Provider Address Phone Fax  [facility]  [address] [place] [address] [phone]  [facility]  [address] [place], [place] [address] [phone] [address] [place] [address] [phone]  [facility]  [place], [place] [address] [place]  [facility] [place] [address] [place]  [facility]  [address], [place] [address] [phone]  [facility]  [place], [place] [address] [phone]  [facility]  [address] [place] [address] [place]  [facility]  [address] [place] [address] [phone]  [facility]  [address] [place] [address] [phone]  [address] [place] [address] [phone]  [facility]  [place] [phone]  [facility]  [address], [place] [address] [phone] [facility]  [address], [place] [address] [phone] [facility]  [address] [place] [address] [place]  [address] [place] [address] [phone] [facility]  [address], [place] [address] [phone] [place]  [place], [place] [address] [place]  [facility]  [address], [place] [address] [phone] [place]  [address] [place] [address] [phone]   Situation ongoing,  CSW will follow up.   Benjaman Kindler, MSW, [facility] 06/20/2022  @ 3:08 PM

## 2022-06-20 NOTE — Progress Notes (Signed)
[facility] Psych ED Progress Note  06/20/2022 5:08 PM [name]  MRN:  [id number]   Principal Problem: Suicidal ideations Diagnosis:  Principal Problem:   Suicidal ideations Active Problems:   Severe episode of recurrent major depressive disorder, without psychotic features ([place])   Anxiety state   ED Assessment Time Calculation: Start Time: 1645 Stop Time: 1700 Total Time in Minutes (Assessment Completion): 15   Subjective: [name], 32 y.o., female patient seen face to face by this provider, consulted with Dr. Leverne Humbles; and chart reviewed on 06/20/22.  On evaluation [name] reports she is still feeling suicidal with a plan to overdose on her medicines.  Patient says she does not know what triggered her suicidal thoughts, "it just came out of the blue, I feel like my medicine is not working anymore ".  Patient says he is feeling anxious, says that her appetite and sleep are fair "can eat, sleep like that because of my anxiety ".  Patient continues to endorse SI, denies HI/AVH.  Patient states that she still wants to go to an inpatient facility informed patient that it was [facility] under [place] border, asked patient if she still wanted to go and she said "yes, as long as it helps me get better. "  During evaluation [name] is laying in her hospital bed in no acute distress. She is alert, oriented x 3, calm, cooperative and attentive. Her mood is euthymic with congruent affect. She has normal speech, and behavior.  Objectively there is no evidence of psychosis/mania or delusional thinking.  Patient is able to converse coherently, no distractibility, or pre-occupation.  She denies homicidal ideation, psychosis, and paranoia.  Patient is pleasant and cooperative, states that she does not like feeling like her medication is not working. Patient denies alcohol use.  According to patient she does smoke cigars on a regular basis.  According to patient she lives at home by  herself. Patient called her mom on the telephone to let her mother know she was about to leave, patient appeared excited to be going to an inpatient facility for treatment.  Past Psychiatric History: Bipolar 1, anxiety, depression  [place] Scale:  [place] ED from 06/19/2022 in [facility] Emergency Department at [facility] ED from 06/15/2022 in [facility] at [facility] ED from 06/11/2022 in [facility] Emergency Department at [place] High Risk No Risk No Risk       Past Medical History:  Past Medical History:  Diagnosis Date   Anxiety    Bipolar 1 disorder ([place])    Cognitive deficits    Depression    Diabetes mellitus without complication ([place])    Hypertension    Mental disorder    Mental health disorder    Obesity     Past Surgical History:  Procedure Laterality Date   CESAREAN SECTION     CESAREAN SECTION N/A 04/25/2013   Procedure: REPEAT CESAREAN SECTION;  Surgeon: [name], MD;  Location: [place] [facility];  Service: Obstetrics;  Laterality: N/A;   MASS EXCISION N/A 06/03/2012   Procedure: EXCISION MASS;  Surgeon: [name], MD;  Location: [place];  Service: ENT;  Laterality: N/A;  Excision uvula mass   TONSILLECTOMY N/A 06/03/2012   Procedure: TONSILLECTOMY;  Surgeon: [name], MD;  Location: [place];  Service: ENT;  Laterality: N/A;   TONSILLECTOMY     Family History:  Family History  Problem Relation Age  of Onset   Hypertension Mother    Diabetes Father     Social History:  Social History   Substance and Sexual Activity  Alcohol Use Not Currently     Social History   Substance and Sexual Activity  Drug Use Not Currently   Types: "Crack" cocaine, Other-see comments   Comment: Patient reports hx of smoking Crack    Social History   Socioeconomic History   Marital status: Single    Spouse name: Not on file   Number of children: Not on file   Years of  education: Not on file   Highest education level: Not on file  Occupational History   Not on file  Tobacco Use   Smoking status: Every Day    Types: Cigars   Smokeless tobacco: Never   Tobacco comments:    Pt declined  Vaping Use   Vaping Use: Never used  Substance and Sexual Activity   Alcohol use: Not Currently   Drug use: Not Currently    Types: "Crack" cocaine, Other-see comments    Comment: Patient reports hx of smoking Crack   Sexual activity: Not Currently    Birth control/protection: None    Comment: occasionally  Other Topics Concern   Not on file  Social History Narrative   ** Merged History Encounter **       Social Determinants of Health   Financial Resource Strain: Not on file  Food Insecurity: No Food Insecurity (04/10/2022)   Hunger Vital Sign    Worried About Running Out of Food in the Last Year: Never true    Ran Out of Food in the Last Year: Never true  Transportation Needs: No Transportation Needs (04/10/2022)   PRAPARE - Hydrologist (Medical): No    Lack of Transportation (Non-Medical): No  Physical Activity: Not on file  Stress: Not on file  Social Connections: Not on file    Sleep: Fair  Appetite:  Fair  Current Medications: Current Facility-Administered Medications  Medication Dose Route Frequency Provider Last Rate Last Admin   amLODipine (NORVASC) tablet 10 mg  10 mg Oral Daily Quintella Reichert, MD   10 mg at 06/20/22 1059   atorvastatin (LIPITOR) tablet 20 mg  20 mg Oral QHS Quintella Reichert, MD       benztropine (COGENTIN) tablet 0.5 mg  0.5 mg Oral BID Quintella Reichert, MD   0.5 mg at 06/20/22 1058   cholecalciferol (VITAMIN D3) 25 MCG (1000 UNIT) tablet 1,000 Units  1,000 Units Oral Daily Quintella Reichert, MD   1,000 Units at 06/20/22 1058   diclofenac Sodium (VOLTAREN) 1 % topical gel 2 g  2 g Topical QID Quintella Reichert, MD   2 g at 06/20/22 1630   gabapentin (NEURONTIN) capsule 100 mg  100 mg Oral TID Quintella Reichert, MD   100 mg at 06/20/22 1630   haloperidol (HALDOL) tablet 10 mg  10 mg Oral QHS Quintella Reichert, MD       insulin aspart protamine- aspart (NOVOLOG MIX 70/30) injection 20 Units  20 Units Subcutaneous BID WC Quintella Reichert, MD   20 Units at 06/20/22 1630   lithium carbonate (ESKALITH) ER tablet 450 mg  450 mg Oral QHS Quintella Reichert, MD       metoprolol tartrate (LOPRESSOR) tablet 12.5 mg  12.5 mg Oral BID Quintella Reichert, MD   12.5 mg at 06/20/22 1058   ondansetron (ZOFRAN-ODT) disintegrating tablet 4 mg  4 mg Oral Q8H PRN  Quintella Reichert, MD       pantoprazole (PROTONIX) EC tablet 40 mg  40 mg Oral Daily Quintella Reichert, MD   40 mg at 06/20/22 1059   venlafaxine XR (EFFEXOR-XR) 24 hr capsule 75 mg  75 mg Oral Q breakfast Quintella Reichert, MD   75 mg at 06/20/22 1058   ziprasidone (GEODON) injection 20 mg  20 mg Intramuscular Once Deno Etienne, DO       Current Outpatient Medications  Medication Sig Dispense Refill   amLODipine (NORVASC) 10 MG tablet Take 10 mg by mouth daily.     benztropine (COGENTIN) 0.5 MG tablet Take 1 tablet (0.5 mg total) by mouth 2 (two) times daily. For eps 60 tablet 0   Cholecalciferol (VITAMIN D3) 25 MCG (1000 UT) CAPS Take 1,000 Units by mouth daily.     diclofenac Sodium (VOLTAREN) 1 % GEL Apply 2 g topically 4 (four) times daily. 50 g 0   gabapentin (NEURONTIN) 100 MG capsule Take 1 capsule (100 mg total) by mouth 3 (three) times daily. For agitation 90 capsule 0   haloperidol (HALDOL) 10 MG tablet Take 1 tablet (10 mg total) by mouth at bedtime. For mood stabilization 30 tablet 0   insulin aspart protamine- aspart (NOVOLOG MIX 70/30) (70-30) 100 UNIT/ML injection Inject 20 Units into the skin 2 (two) times daily with a meal.     lithium carbonate (ESKALITH) 450 MG ER tablet Take 1 tablet (450 mg total) by mouth at bedtime. For mood stabilization 30 tablet 0   metoprolol tartrate (LOPRESSOR) 25 MG tablet Take 12.5 mg by mouth 2 (two) times daily.      omeprazole (PRILOSEC) 20 MG capsule Take 20 mg by mouth daily.     ondansetron (ZOFRAN-ODT) 4 MG disintegrating tablet Take 1 tablet (4 mg total) by mouth every 8 (eight) hours as needed for nausea or vomiting. 10 tablet 0   simvastatin (ZOCOR) 40 MG tablet Take 40 mg by mouth at bedtime.     venlafaxine XR (EFFEXOR-XR) 75 MG 24 hr capsule Take 1 capsule (75 mg total) by mouth daily with breakfast. For depression 30 capsule 0   traZODone (DESYREL) 50 MG tablet Take 1 tablet (50 mg total) by mouth at bedtime as needed for sleep. (Patient not taking: Reported on 06/20/2022) 30 tablet 0    Lab Results:  Results for orders placed or performed during the hospital encounter of 06/19/22 (from the past 48 hour(s))  Comprehensive metabolic panel     Status: Abnormal   Collection Time: 06/19/22 11:25 PM  Result Value Ref Range   Sodium 134 (L) 135 - 145 mmol/L   Potassium 3.4 (L) 3.5 - 5.1 mmol/L   Chloride 106 98 - 111 mmol/L   CO2 23 22 - 32 mmol/L   Glucose, Bld 234 (H) 70 - 99 mg/dL    Comment: Glucose reference range applies only to samples taken after fasting for at least 8 hours.   BUN 9 6 - 20 mg/dL   Creatinine, Ser 1.02 (H) 0.44 - 1.00 mg/dL   Calcium 8.8 (L) 8.9 - 10.3 mg/dL   Total Protein 8.0 6.5 - 8.1 g/dL   Albumin 4.1 3.5 - 5.0 g/dL   AST 31 15 - 41 U/L   ALT 22 0 - 44 U/L   Alkaline Phosphatase 62 38 - 126 U/L   Total Bilirubin 0.4 0.3 - 1.2 mg/dL   GFR, Estimated >60 >60 mL/min    Comment: (NOTE) Calculated using the CKD-EPI Creatinine  Equation (2021)    Anion gap 5 5 - 15    Comment: Performed at [facility], [place] [address] [place], [place] [address]  Ethanol     Status: None   Collection Time: 06/19/22 11:25 PM  Result Value Ref Range   Alcohol, Ethyl (B) <10 <10 mg/dL    Comment: (NOTE) Lowest detectable limit for serum alcohol is 10 mg/dL.  For medical purposes only. Performed at [facility], [place] [address] [place], [place] [address]   CBC with Diff     Status: Abnormal   Collection Time: 06/19/22 11:25 PM  Result Value Ref Range   WBC 12.2 (H) 4.0 - 10.5 K/uL   RBC 4.39 3.87 - 5.11 MIL/uL   Hemoglobin 10.6 (L) 12.0 - 15.0 g/dL   HCT 34.5 (L) 36.0 - 46.0 %   MCV 78.6 (L) 80.0 - 100.0 fL   MCH 24.1 (L) 26.0 - 34.0 pg   MCHC 30.7 30.0 - 36.0 g/dL   RDW 16.2 (H) 11.5 - 15.5 %   Platelets 247 150 - 400 K/uL    Comment: REPEATED TO VERIFY   nRBC 0.0 0.0 - 0.2 %   Neutrophils Relative % 56 %   Neutro Abs 6.9 1.7 - 7.7 K/uL   Lymphocytes Relative 35 %   Lymphs Abs 4.2 (H) 0.7 - 4.0 K/uL   Monocytes Relative 6 %   Monocytes Absolute 0.8 0.1 - 1.0 K/uL   Eosinophils Relative 2 %   Eosinophils Absolute 0.3 0.0 - 0.5 K/uL   Basophils Relative 1 %   Basophils Absolute 0.1 0.0 - 0.1 K/uL   Immature Granulocytes 0 %   Abs Immature Granulocytes 0.03 0.00 - 0.07 K/uL    Comment: Performed at [facility], [place] [address], [place], [place] [id number]  Salicylate level     Status: Abnormal   Collection Time: 06/19/22 11:25 PM  Result Value Ref Range   Salicylate Lvl Q000111Q (L) 7.0 - 30.0 mg/dL    Comment: Performed at [facility], [place] [address], [place], [place] [address]  Acetaminophen level     Status: Abnormal   Collection Time: 06/19/22 11:25 PM  Result Value Ref Range   Acetaminophen (Tylenol), Serum <10 (L) 10 - 30 ug/mL    Comment: (NOTE) Therapeutic concentrations vary significantly. A range of 10-30 ug/mL  may be an effective concentration for many patients. However, some  are best treated at concentrations outside of this range. Acetaminophen concentrations >150 ug/mL at 4 hours after ingestion  and >50 ug/mL at 12 hours after ingestion are often associated with  toxic reactions.  Performed at [facility], [place] [address], [place],  [address]   Urine rapid drug screen (hosp performed)     Status: None   Collection  Time: 06/19/22 11:26 PM  Result Value Ref Range   Opiates NONE DETECTED NONE DETECTED   Cocaine NONE DETECTED NONE DETECTED   Benzodiazepines NONE DETECTED NONE DETECTED   Amphetamines NONE DETECTED NONE DETECTED   Tetrahydrocannabinol NONE DETECTED NONE DETECTED   Barbiturates NONE DETECTED NONE DETECTED    Comment: (NOTE) DRUG SCREEN FOR MEDICAL PURPOSES ONLY.  IF CONFIRMATION IS NEEDED FOR ANY PURPOSE, NOTIFY LAB WITHIN 5 DAYS.  LOWEST DETECTABLE LIMITS FOR URINE DRUG SCREEN Drug Class                     Cutoff (ng/mL) Amphetamine and metabolites  1000 Barbiturate and metabolites    200 Benzodiazepine                 200 Opiates and metabolites        300 Cocaine and metabolites        300 THC                            50 Performed at [facility], [place] [address], [place], [place] [address]   Lithium level     Status: Abnormal   Collection Time: 06/20/22  6:51 AM  Result Value Ref Range   Lithium Lvl 0.36 (L) 0.60 - 1.20 mmol/L    Comment: Performed at [facility], [place] [address], [place], [place] [address]    Blood Alcohol level:  Lab Results  Component Value Date   [facility] <10 06/19/2022   ETH <10 04/23/2022    Physical Findings:  CIWA:    COWS:     Musculoskeletal: Strength & Muscle Tone: within normal limits Gait & Station: normal Patient leans: N/A  Psychiatric Specialty Exam:  Presentation  General Appearance:  Appropriate for Environment  Eye Contact: Good  Speech: Clear and Coherent  Speech Volume: Normal  Handedness: Right   Mood and Affect  Mood: Euthymic  Affect: Appropriate   Thought Process  Thought Processes: Coherent  Descriptions of Associations:Intact  Orientation:Full (Time, Place and Person)  Thought Content:WDL  History of Schizophrenia/Schizoaffective disorder:No  Duration of Psychotic Symptoms:N/A  Hallucinations:Hallucinations: None  Ideas of  Reference:None  Suicidal Thoughts:Suicidal Thoughts: Yes, Passive SI Passive Intent and/or Plan: With Intent  Homicidal Thoughts:Homicidal Thoughts: No   Sensorium  Memory: Immediate Good; Recent Fair  Judgment: Fair  Insight: Fair   Materials engineer: Fair  Attention Span: Fair  Recall: [place] of Knowledge: Fair  Language: Good   Psychomotor Activity  Psychomotor Activity: Psychomotor Activity: Normal   Assets  Assets: Communication Skills; Social Support; Housing   Sleep  Sleep: Sleep: Fair    Physical Exam: Physical Exam HENT:     Nose: Nose normal.  Eyes:     Pupils: Pupils are equal, round, and reactive to light.  Pulmonary:     Effort: Pulmonary effort is normal.  Musculoskeletal:        General: Normal range of motion.  Neurological:     Mental Status: She is alert.  Psychiatric:        Attention and Perception: Attention normal.        Mood and Affect: Mood normal.        Speech: Speech normal.        Behavior: Behavior is cooperative.        Thought Content: Thought content includes suicidal ideation. Thought content includes suicidal plan.        Cognition and Memory: Cognition is impaired.        Judgment: Judgment is inappropriate.    Review of Systems  Constitutional: Negative.   HENT: Negative.    Respiratory: Negative.    Psychiatric/Behavioral:  Positive for depression and suicidal ideas.    Blood pressure (!) 145/103, pulse 100, temperature 99 F (37.2 C), temperature source Oral, resp. rate 20, last menstrual period 05/25/2022, SpO2 100 %. There is no height or weight on file to calculate BMI.   Medical Decision Making: Chart reviewed and patient discussed with Dr. Leverne Humbles on 06/20/2022.  Patient continues to be criteria for inpatient  psychiatric hospitalization.     Disposition: Patient excepted to [facility] by Dr. Mariea Clonts for inpatient psychiatric treatment.      Dekayla Prestridge MOTLEY-MANGRUM, PMHNP 06/20/2022, 5:08 PM

## 2022-06-20 NOTE — ED Notes (Addendum)
Attempted to call and give report. Spoke with Gerald Stabs.  Fax number [phone] for covid results.

## 2022-06-20 NOTE — ED Notes (Signed)
Called safe transport for transport.

## 2022-06-20 NOTE — ED Notes (Signed)
[facility] to inform them that pt is currently en route to their facility

## 2022-06-20 NOTE — ED Notes (Signed)
[name], NP recommends inpatient psychiatric treatment. AC at [facility] will review for possible admission.

## 2022-06-20 NOTE — ED Notes (Signed)
Pt backpack/belongings placed at nursing station. No further belongings collected at this time

## 2022-06-20 NOTE — Progress Notes (Signed)
Pt was accepted to [facility] 06/20/22 PENDING NEGATIVE COVID; Bed Assignment  Adult BHU  Pt meets inpatient criteria per Michaele Offer, [place]  Attending Physician will be Dr. Mariea Clonts  Report can be called to: [phone]  Pt can arrive: BED IS [place] Team notified: Michaele Offer, PMHNP, Claretta Fraise, RN, Dene Gentry, MD  Nadara Mode, [place] 06/20/2022 @ 5:06 PM
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
## 2022-06-20 NOTE — ED Notes (Signed)
Patient resting at this time in bed

## 2022-06-20 NOTE — ED Notes (Signed)
Noted pt BP elevated (143/103). Pt sts she's feeling "anxious" att. RN aware

## 2022-06-20 NOTE — ED Notes (Signed)
Pt on the phone w/ her mother.

## 2022-06-30 ENCOUNTER — Emergency Department (HOSPITAL_COMMUNITY)
Admission: EM | Admit: 2022-06-30 | Discharge: 2022-07-01 | Disposition: A | Discharge status: Home / Self Care | Payer: Medicaid Other | Attending: Emergency Medicine | Admitting: Emergency Medicine

## 2022-06-30 ENCOUNTER — Other Ambulatory Visit: Payer: Self-pay

## 2022-06-30 DIAGNOSIS — T50901A Poisoning by unspecified drugs, medicaments and biological substances, accidental (unintentional), initial encounter: Secondary | ICD-10-CM | POA: Insufficient documentation

## 2022-06-30 DIAGNOSIS — Z79899 Other long term (current) drug therapy: Secondary | ICD-10-CM | POA: Diagnosis not present

## 2022-06-30 DIAGNOSIS — R000 Tachycardia, unspecified: Secondary | ICD-10-CM | POA: Insufficient documentation

## 2022-06-30 DIAGNOSIS — T1491XA Suicide attempt, initial encounter: Secondary | ICD-10-CM | POA: Diagnosis present

## 2022-06-30 DIAGNOSIS — R45851 Suicidal ideations: Secondary | ICD-10-CM | POA: Insufficient documentation

## 2022-06-30 DIAGNOSIS — Z1152 Encounter for screening for COVID-19: Secondary | ICD-10-CM | POA: Diagnosis not present

## 2022-06-30 DIAGNOSIS — F329 Major depressive disorder, single episode, unspecified: Secondary | ICD-10-CM | POA: Diagnosis not present

## 2022-06-30 DIAGNOSIS — Z794 Long term (current) use of insulin: Secondary | ICD-10-CM | POA: Diagnosis not present

## 2022-06-30 LAB — COMPREHENSIVE METABOLIC PANEL
ALT: 23 U/L (ref 0–44)
AST: 25 U/L (ref 15–41)
Albumin: 4.2 g/dL (ref 3.5–5.0)
Alkaline Phosphatase: 60 U/L (ref 38–126)
Anion gap: 7 (ref 5–15)
BUN: 6 mg/dL (ref 6–20)
CO2: 26 mmol/L (ref 22–32)
Calcium: 8.8 mg/dL — ABNORMAL LOW (ref 8.9–10.3)
Chloride: 106 mmol/L (ref 98–111)
Creatinine, Ser: 0.81 mg/dL (ref 0.44–1.00)
GFR, Estimated: >60 mL/min (ref >60)
Glucose, Bld: 219 mg/dL — ABNORMAL HIGH (ref 70–99)
Potassium: 3.1 mmol/L — ABNORMAL LOW (ref 3.5–5.1)
Sodium: 139 mmol/L (ref 135–145)
Total Bilirubin: 0.6 mg/dL (ref 0.3–1.2)
Total Protein: 7.9 g/dL (ref 6.5–8.1)

## 2022-06-30 LAB — CBC WITH DIFFERENTIAL/PLATELET
Abs Immature Granulocytes: 0.02 10*3/uL (ref 0.00–0.07)
Basophils Absolute: 0.1 10*3/uL (ref 0.0–0.1)
Basophils Relative: 1 %
Eosinophils Absolute: 0.3 10*3/uL (ref 0.0–0.5)
Eosinophils Relative: 3 %
HCT: 35.6 % — ABNORMAL LOW (ref 36.0–46.0)
Hemoglobin: 10.8 g/dL — ABNORMAL LOW (ref 12.0–15.0)
Immature Granulocytes: 0 %
Lymphocytes Relative: 29 %
Lymphs Abs: 2.8 10*3/uL (ref 0.7–4.0)
MCH: 23.7 pg — ABNORMAL LOW (ref 26.0–34.0)
MCHC: 30.3 g/dL (ref 30.0–36.0)
MCV: 78.2 fL — ABNORMAL LOW (ref 80.0–100.0)
Monocytes Absolute: 0.5 10*3/uL (ref 0.1–1.0)
Monocytes Relative: 5 %
Neutro Abs: 5.9 10*3/uL (ref 1.7–7.7)
Neutrophils Relative %: 62 %
Platelets: 242 10*3/uL (ref 150–400)
RBC: 4.55 MIL/uL (ref 3.87–5.11)
RDW: 16.4 % — ABNORMAL HIGH (ref 11.5–15.5)
WBC: 9.6 10*3/uL (ref 4.0–10.5)
nRBC: 0 % (ref 0.0–0.2)

## 2022-06-30 LAB — RESP PANEL BY RT-PCR (RSV, FLU A&B, COVID)  RVPGX2
Influenza A by PCR: NEGATIVE
Influenza B by PCR: NEGATIVE
Resp Syncytial Virus by PCR: NEGATIVE
SARS Coronavirus 2 by RT PCR: NEGATIVE

## 2022-06-30 LAB — RAPID URINE DRUG SCREEN, HOSP PERFORMED
Amphetamines: NOT DETECTED
Barbiturates: NOT DETECTED
Benzodiazepines: NOT DETECTED
Cocaine: NOT DETECTED
Opiates: NOT DETECTED
Tetrahydrocannabinol: NOT DETECTED

## 2022-06-30 LAB — ETHANOL: Alcohol, Ethyl (B): <10 mg/dL (ref <10)

## 2022-06-30 LAB — GROUP A STREP BY PCR: Group A Strep by PCR: NOT DETECTED

## 2022-06-30 MED ORDER — POTASSIUM CHLORIDE CRYS ER 20 MEQ PO TBCR
40.0000 meq | EXTENDED_RELEASE_TABLET | Freq: Once | ORAL | Status: AC
  Administered 2022-06-30: 40 meq via ORAL
  Filled 2022-06-30: qty 2

## 2022-06-30 MED ORDER — LORAZEPAM 1 MG PO TABS
1.0000 mg | ORAL_TABLET | Freq: Once | ORAL | Status: AC
  Administered 2022-06-30: 1 mg via ORAL
  Filled 2022-06-30: qty 1

## 2022-06-30 MED ORDER — SODIUM CHLORIDE 0.9 % IV BOLUS
1000.0000 mL | Freq: Once | INTRAVENOUS | Status: AC
  Administered 2022-06-30: 1000 mL via INTRAVENOUS

## 2022-06-30 NOTE — ED Notes (Signed)
Pts belongings put in cabinet 23-25 at nurses station. Pts belongings include a blue book bag and the clothing she was brought in with.

## 2022-06-30 NOTE — ED Triage Notes (Signed)
EMS reports from home, Pt states 5-6 Gabapentin and 5-6 ('5mg'$ )Haldol, approx 20 minutes ago. Boyfriends gives conflicting statement.   BP 160/110/ HR 110 RR 22 Sp02 100 RA CBG 244

## 2022-06-30 NOTE — ED Provider Notes (Signed)
[place] AT [facility] Provider Note   CSN: [id number] Arrival date & time: 06/30/22  1610     History  Chief Complaint  Patient presents with   Ingestion   Suicide Attempt    [name] is a 32 y.o. female presenting today after suicide attempt.  She reports that she took 5 of her 5 mg Haldol, 4 of her gabapentin and 3-4 Effexor.  She says that she is tired of living with the depression and mental illnesses that she has.  No homicidal ideations.  No AVH.  She was started on Effexor in December and she believes that it is causing her to have worsening mood swings.  Also says that she is having a sore throat   Ingestion       Home Medications Prior to Admission medications   Medication Sig Start Date End Date Taking? Authorizing Provider  amLODipine (NORVASC) 10 MG tablet Take 10 mg by mouth daily.    [provider]  benztropine (COGENTIN) 0.5 MG tablet Take 1 tablet (0.5 mg total) by mouth 2 (two) times daily. For eps 04/14/22   Lindell Spar I, NP  Cholecalciferol (VITAMIN D3) 25 MCG (1000 UT) CAPS Take 1,000 Units by mouth daily.    [provider]  diclofenac Sodium (VOLTAREN) 1 % GEL Apply 2 g topically 4 (four) times daily. 06/07/22   Antonietta Breach, PA-C  gabapentin (NEURONTIN) 100 MG capsule Take 1 capsule (100 mg total) by mouth 3 (three) times daily. For agitation 04/14/22   Lindell Spar I, NP  haloperidol (HALDOL) 10 MG tablet Take 1 tablet (10 mg total) by mouth at bedtime. For mood stabilization 04/14/22   Nw[place], Herbert Pun I, NP  insulin aspart protamine- aspart (NOVOLOG MIX 70/30) (70-30) 100 UNIT/ML injection Inject 20 Units into the skin 2 (two) times daily with a meal.    [provider]  lithium carbonate (ESKALITH) 450 MG ER tablet Take 1 tablet (450 mg total) by mouth at bedtime. For mood stabilization 04/14/22   Lindell Spar I, NP  metoprolol tartrate (LOPRESSOR) 25 MG tablet Take 12.5 mg by mouth 2  (two) times daily.    [provider]  omeprazole (PRILOSEC) 20 MG capsule Take 20 mg by mouth daily.    [provider]  ondansetron (ZOFRAN-ODT) 4 MG disintegrating tablet Take 1 tablet (4 mg total) by mouth every 8 (eight) hours as needed for nausea or vomiting. 06/15/22   Barrett Henle, MD  simvastatin (ZOCOR) 40 MG tablet Take 40 mg by mouth at bedtime.    [provider]  traZODone (DESYREL) 50 MG tablet Take 1 tablet (50 mg total) by mouth at bedtime as needed for sleep. Patient not taking: Reported on 06/20/2022 04/14/22   Lindell Spar I, NP  venlafaxine XR (EFFEXOR-XR) 75 MG 24 hr capsule Take 1 capsule (75 mg total) by mouth daily with breakfast. For depression 04/15/22   Lindell Spar I, NP      Allergies    Wellbutrin [bupropion], Omnipaque [iohexol], Penicillins, Atarax [hydroxyzine], Contrast media [iodinated contrast media], and Cymbalta [duloxetine hcl]    Review of Systems   Review of Systems  Physical Exam Updated Vital Signs BP (!) 154/117 (BP Location: Right Arm)   Pulse (!) 109   Temp 98.4 F (36.9 C) (Oral)   Resp (!) 22   LMP 05/25/2022 (Approximate)   SpO2 99%  Physical Exam Vitals and nursing note reviewed.  Constitutional:  Appearance: Normal appearance.  HENT:     Head: Normocephalic and atraumatic.     Mouth/Throat:     Mouth: Mucous membranes are moist.     Pharynx: Oropharynx is clear.  Eyes:     General: No scleral icterus.    Conjunctiva/sclera: Conjunctivae normal.  Cardiovascular:     Rate and Rhythm: Regular rhythm. Tachycardia present.  Pulmonary:     Effort: Pulmonary effort is normal. No respiratory distress.  Skin:    Findings: No rash.  Neurological:     Mental Status: She is alert.     Comments: Alert and oriented.  Ambulatory, normal neuroexam  Psychiatric:        Mood and Affect: Mood normal.     ED Results / Procedures / Treatments   Labs (all labs ordered are listed, but only abnormal  results are displayed) Labs Reviewed  GROUP A STREP BY PCR  COMPREHENSIVE METABOLIC PANEL  ETHANOL  RAPID URINE DRUG SCREEN, HOSP PERFORMED  CBC WITH DIFFERENTIAL/PLATELET  HCG, QUANTITATIVE, PREGNANCY    EKG None  Radiology No results found.  Procedures Procedures   Medications Ordered in ED Medications  sodium chloride 0.9 % bolus 1,000 mL (has no administration in time range)  LORazepam (ATIVAN) tablet 1 mg (has no administration in time range)    ED Course/ Medical Decision Making/ A&P                             Medical Decision Making Amount and/or Complexity of Data Reviewed Labs: ordered.  Risk Prescription drug management.   32 year old female presenting today after suicide attempt.  She is well-known to the emergency department.  She is voluntarily here today.  Alert and oriented.  Not responding to internal stimuli.  Labs: Potassium 3.1, hemoglobin 10.8 which is patient's baseline.  Strep negative  Treatment: Given Ativan for anxiety, fluids for hydration and potassium for repletion  Consults: Psych team wants to reevaluate the patient tomorrow morning due to her Haldol ingestion.  Patient is medically cleared and will await reassessment by psychiatry tomorrow.   Final Clinical Impression(s) / ED Diagnoses Final diagnoses:  Suicide attempt [facility])    Rx / [place] Orders ED Discharge Orders     None         Chanon Loney, Cecilio Asper, PA-C 06/30/22 1810    Isla Pence, MD 07/02/22 [phone]

## 2022-06-30 NOTE — Consult Note (Signed)
[facility] ED ASSESSMENT   Reason for Consult:  Psychiatry evaluation Referring Physician:  ER Physician Patient Identification: [name] MRN:  [id number] ED Chief Complaint: Suicide attempt [facility])  Diagnosis:  Principal Problem:   Suicide attempt [facility])   ED Assessment Time Calculation: Start Time: 1735 Stop Time: 1800 Total Time in Minutes (Assessment Completion): 25   Subjective:   [name] is a 32 y.o. female patient admitted with diagnosis of schizoaffective disorder, bipolar type, Borderline Intellectual functioning, multiple intentional Suicide by OD and Generalized anxiety disorder brought in by EMS after she called to report she OD on 5-6 ('5mg'$ ) Haldol and 4 Capsules of Gabapentin..  Per Triage Note her boyfriend gave conflicting statement.  HPI:  Patient was seen awake, alert but anxious and shaking both legs while lying in stretcher.  She reports she is under a lot of stress and anger due to her inability to move to [place].  Patient reports that she is tired of staying in [place] because of the [place]"  She states that the Bus place is too congested and that people do not care about other people's need.  Patient states that over all [place] is too busy for her to stay.  She remains suicidal with plan to OD on Medications if she goes home. Patient admitted she was just released from [facility] four days ago.  She reports she received good care but coming back to [facility] made her upset and suicidal.  She lives alone she says and she was seen by [facility] this morning.   [name], morbidly Obese was seen this evening after a reported OD on Haldol and Gabapentin.  She has hx of Multiple OD suicide attempts, multiple Inpatient Psychiatry hospitalizations.  She reports good appetite and fairly ok sleep but could not quantify her sleep.  She remains suicidal but denies AVH. Patient will be monitored overnight due to reported  high dose Haldol and will be reevaluated in the morning.  Past Psychiatric History: schizoaffective disorder, bipolar type, Borderline Intellectual functioning, multiple intentional Suicide by OD and Generalized anxiety disorder.  [facility] are her outpatient providers.  Multiple inpatient Psychiatric hospitalizations and suicide attempts by OD.  Last Hospitalization was last week at [facility].  Risk to Self or Others: Is the patient at risk to self? Yes Has the patient been a risk to self in the past 6 months? No Has the patient been a risk to self within the distant past? Yes Is the patient a risk to others? No Has the patient been a risk to others in the past 6 months? No Has the patient been a risk to others within the distant past? No  [place] Scale:  [place] ED from 06/30/2022 in [facility] Emergency Department at [facility] ED from 06/19/2022 in [facility] Emergency Department at [facility] ED from 06/15/2022 in [facility] [place] High Risk High Risk No Risk       AIMS:  , , ,  ,   ASAM:    Substance Abuse:     Past Medical History:  Past Medical History:  Diagnosis Date   Anxiety    Bipolar 1 disorder ([place])    Cognitive deficits    Depression    Diabetes mellitus without complication ([place])    Hypertension    Mental disorder    Mental health disorder    Obesity  Past Surgical History:  Procedure Laterality Date   CESAREAN SECTION     CESAREAN SECTION N/A 04/25/2013   Procedure: REPEAT CESAREAN SECTION;  Surgeon: [name], MD;  Location: [facility];  Service: Obstetrics;  Laterality: N/A;   MASS EXCISION N/A 06/03/2012   Procedure: EXCISION MASS;  Surgeon: [name], MD;  Location: [place];  Service: ENT;  Laterality: N/A;  Excision uvula mass   TONSILLECTOMY N/A 06/03/2012   Procedure: TONSILLECTOMY;  Surgeon: [name], MD;  Location: [place];  Service: ENT;  Laterality: N/A;   TONSILLECTOMY     Family History:  Family History  Problem Relation Age of Onset   Hypertension Mother    Diabetes Father    Family Psychiatric  History: Mother-  Reports mother has a type of Mental issue but does not know which one. Social History:  Social History   Substance and Sexual Activity  Alcohol Use Not Currently     Social History   Substance and Sexual Activity  Drug Use Not Currently   Types: "Crack" cocaine, Other-see comments   Comment: Patient reports hx of smoking Crack    Social History   Socioeconomic History   Marital status: Single    Spouse name: Not on file   Number of children: Not on file   Years of education: Not on file   Highest education level: Not on file  Occupational History   Not on file  Tobacco Use   Smoking status: Every Day    Types: Cigars   Smokeless tobacco: Never   Tobacco comments:    Pt declined  Vaping Use   Vaping Use: Never used  Substance and Sexual Activity   Alcohol use: Not Currently   Drug use: Not Currently    Types: "Crack" cocaine, Other-see comments    Comment: Patient reports hx of smoking Crack   Sexual activity: Not Currently    Birth control/protection: None    Comment: occasionally  Other Topics Concern   Not on file  Social History Narrative   ** Merged History Encounter **       Social Determinants of Health   Financial Resource Strain: Not on file  Food Insecurity: No Food Insecurity (04/10/2022)   Hunger Vital Sign    Worried About Running Out of Food in the Last Year: Never true    Ran Out of Food in the Last Year: Never true  Transportation Needs: No Transportation Needs (04/10/2022)   PRAPARE - Hydrologist (Medical): No    Lack of Transportation (Non-Medical): No  Physical Activity: Not on file  Stress: Not on file  Social Connections: Not on file   Additional Social History:    Allergies:    Allergies  Allergen Reactions   Wellbutrin [Bupropion] Shortness Of Breath   Omnipaque [Iohexol] Swelling and Other (See Comments)    Eye swelling   Penicillins Hives and Other (See Comments)    Has patient had a PCN reaction causing immediate rash, facial/tongue/throat swelling, SOB or lightheadedness with hypotension: Unknown Has patient had a PCN reaction causing severe rash involving mucus membranes or skin necrosis: Yes Has patient had a PCN reaction that required hospitalization Unknown Has patient had a PCN reaction occurring within the last 10 years: No If all of the above answers are "NO", then may proceed with Cephalosporin use.   Atarax [Hydroxyzine] Other (See Comments)    Causes hyperactivity, makes pt want to "fight"  Contrast Media [Iodinated Contrast Media] Other (See Comments)    Eyes swell up   Cymbalta [Duloxetine Hcl] Other (See Comments)    No appetite and makes the patient "act up"    Labs:  Results for orders placed or performed during the hospital encounter of 06/30/22 (from the past 48 hour(s))  Comprehensive metabolic panel     Status: Abnormal   Collection Time: 06/30/22  4:50 PM  Result Value Ref Range   Sodium 139 135 - 145 mmol/L   Potassium 3.1 (L) 3.5 - 5.1 mmol/L   Chloride 106 98 - 111 mmol/L   CO2 26 22 - 32 mmol/L   Glucose, Bld 219 (H) 70 - 99 mg/dL    Comment: Glucose reference range applies only to samples taken after fasting for at least 8 hours.   BUN 6 6 - 20 mg/dL   Creatinine, Ser 0.81 0.44 - 1.00 mg/dL   Calcium 8.8 (L) 8.9 - 10.3 mg/dL   Total Protein 7.9 6.5 - 8.1 g/dL   Albumin 4.2 3.5 - 5.0 g/dL   AST 25 15 - 41 U/L   ALT 23 0 - 44 U/L   Alkaline Phosphatase 60 38 - 126 U/L   Total Bilirubin 0.6 0.3 - 1.2 mg/dL   GFR, Estimated >60 >60 mL/min    Comment: (NOTE) Calculated using the CKD-EPI Creatinine Equation (2021)    Anion gap 7 5 - 15    Comment: Performed at [facility], [place] [address],  [place], [place] [address]  Ethanol     Status: None   Collection Time: 06/30/22  4:50 PM  Result Value Ref Range   Alcohol, Ethyl (B) <10 <10 mg/dL    Comment: (NOTE) Lowest detectable limit for serum alcohol is 10 mg/dL.  For medical purposes only. Performed at [facility], [place] [address], [place], [place] [address]   CBC with Diff     Status: Abnormal   Collection Time: 06/30/22  4:50 PM  Result Value Ref Range   WBC 9.6 4.0 - 10.5 K/uL   RBC 4.55 3.87 - 5.11 MIL/uL   Hemoglobin 10.8 (L) 12.0 - 15.0 g/dL   HCT 35.6 (L) 36.0 - 46.0 %   MCV 78.2 (L) 80.0 - 100.0 fL   MCH 23.7 (L) 26.0 - 34.0 pg   MCHC 30.3 30.0 - 36.0 g/dL   RDW 16.4 (H) 11.5 - 15.5 %   Platelets 242 150 - 400 K/uL   nRBC 0.0 0.0 - 0.2 %   Neutrophils Relative % 62 %   Neutro Abs 5.9 1.7 - 7.7 K/uL   Lymphocytes Relative 29 %   Lymphs Abs 2.8 0.7 - 4.0 K/uL   Monocytes Relative 5 %   Monocytes Absolute 0.5 0.1 - 1.0 K/uL   Eosinophils Relative 3 %   Eosinophils Absolute 0.3 0.0 - 0.5 K/uL   Basophils Relative 1 %   Basophils Absolute 0.1 0.0 - 0.1 K/uL   Immature Granulocytes 0 %   Abs Immature Granulocytes 0.02 0.00 - 0.07 K/uL    Comment: Performed at [facility], [place] [address], [place],  [address]  Group A Strep by PCR     Status: None   Collection Time: 06/30/22  5:03 PM   Specimen: Throat; Sterile Swab  Result Value Ref Range   Group A Strep by PCR NOT DETECTED NOT DETECTED    Comment: Performed at [facility], [place] [address] [place], [place]  [address]    No current facility-administered medications for this encounter.   Current Outpatient Medications  Medication Sig Dispense Refill   venlafaxine XR (EFFEXOR-XR) 150 MG 24 hr capsule Take 150 mg by mouth daily with breakfast.     amLODipine (NORVASC) 10 MG tablet Take 10 mg by mouth daily.     benztropine (COGENTIN) 0.5 MG tablet Take 1 tablet (0.5 mg total) by mouth 2 (two) times  daily. For eps 60 tablet 0   Cholecalciferol (VITAMIN D3) 25 MCG (1000 UT) CAPS Take 1,000 Units by mouth daily.     diclofenac Sodium (VOLTAREN) 1 % GEL Apply 2 g topically 4 (four) times daily. 50 g 0   gabapentin (NEURONTIN) 100 MG capsule Take 1 capsule (100 mg total) by mouth 3 (three) times daily. For agitation 90 capsule 0   haloperidol (HALDOL) 10 MG tablet Take 1 tablet (10 mg total) by mouth at bedtime. For mood stabilization 30 tablet 0   insulin aspart protamine- aspart (NOVOLOG MIX 70/30) (70-30) 100 UNIT/ML injection Inject 20 Units into the skin 2 (two) times daily with a meal.     lithium carbonate (ESKALITH) 450 MG ER tablet Take 1 tablet (450 mg total) by mouth at bedtime. For mood stabilization 30 tablet 0   metoprolol tartrate (LOPRESSOR) 25 MG tablet Take 12.5 mg by mouth 2 (two) times daily.     omeprazole (PRILOSEC) 20 MG capsule Take 20 mg by mouth daily.     ondansetron (ZOFRAN-ODT) 4 MG disintegrating tablet Take 1 tablet (4 mg total) by mouth every 8 (eight) hours as needed for nausea or vomiting. 10 tablet 0   simvastatin (ZOCOR) 40 MG tablet Take 40 mg by mouth at bedtime.     traZODone (DESYREL) 50 MG tablet Take 1 tablet (50 mg total) by mouth at bedtime as needed for sleep. (Patient not taking: Reported on 06/20/2022) 30 tablet 0   venlafaxine XR (EFFEXOR-XR) 75 MG 24 hr capsule Take 1 capsule (75 mg total) by mouth daily with breakfast. For depression (Patient not taking: Reported on 06/30/2022) 30 capsule 0    Musculoskeletal: Strength & Muscle Tone:  in stretcher lying down Gait & Station:  in stretcher lying down Patient leans:  see above   Psychiatric Specialty Exam: Presentation  General Appearance:  Casual; Neat; Other (comment) (Morbid Obesity)  Eye Contact: Good  Speech: Clear and Coherent; Normal Rate  Speech Volume: Normal  Handedness: Right   Mood and Affect  Mood: Angry; Anxious  Affect: Congruent   Thought Process  Thought  Processes: Coherent; Goal Directed; Linear  Descriptions of Associations:Intact  Orientation:Full (Time, Place and Person)  Thought Content:Logical  History of Schizophrenia/Schizoaffective disorder:Yes  Duration of Psychotic Symptoms:Greater than six months  Hallucinations:Hallucinations: None  Ideas of Reference:None  Suicidal Thoughts:Suicidal Thoughts: Yes, Active SI Active Intent and/or Plan: With Intent; With Plan; With Means to Carry Out; With Access to Means  Homicidal Thoughts:Homicidal Thoughts: No   Sensorium  Memory: Immediate Good; Recent Good; Remote Good  Judgment: Intact  Insight: Present   Executive Functions  Concentration: Good  Attention Span: Good  Recall: Good  Fund of Knowledge: Good  Language: Good   Psychomotor Activity  Psychomotor Activity: Psychomotor Activity: Normal   Assets  Assets: Communication Skills; Housing; Social Support    Sleep  Sleep: Sleep: Good   Physical Exam: Physical Exam Vitals and nursing note reviewed.  Constitutional:      Appearance: Normal appearance.  HENT:     Head:  Normocephalic.     Nose: Nose normal.  Cardiovascular:     Rate and Rhythm: Tachycardia present.  Pulmonary:     Effort: Pulmonary effort is normal.  Musculoskeletal:        General: Normal range of motion.     Cervical back: Normal range of motion.  Skin:    General: Skin is warm and dry.  Neurological:     General: No focal deficit present.     Mental Status: She is alert and oriented to person, place, and time.    Review of Systems  Constitutional: Negative.   HENT:  Positive for congestion and sore throat.   Eyes: Negative.   Respiratory: Negative.    Cardiovascular: Negative.   Gastrointestinal: Negative.   Genitourinary: Negative.   Musculoskeletal: Negative.   Skin: Negative.   Neurological: Negative.   Endo/Heme/Allergies: Negative.   Psychiatric/Behavioral:  Positive for suicidal ideas. The  patient is nervous/anxious.    Blood pressure (!) 154/117, pulse (!) 109, temperature 98.4 F (36.9 C), temperature source Oral, resp. rate (!) 22, last menstrual period 05/25/2022, SpO2 99 %. There is no height or weight on file to calculate BMI.  Medical Decision Making: [name], morbidly Obese was seen this evening after a reported OD on Haldol and Gabapentin.  She has hx of Multiple OD suicide attempts, multiple Inpatient Psychiatry hospitalizations.  She reports good appetite and fairly ok sleep but could not quantify her sleep.  She remains suicidal but denies AVH. Patient will be monitored overnight due to reported high dose Haldol and will be reevaluated in the morning.  [facility]  Team to be called in am.  Problem 1: Suicide attempt  Disposition:  Monitor overnight and reevaluate in am.  Delfin Gant, NP--P,MHNP-BC 06/30/2022 6:02 PM

## 2022-07-01 NOTE — Progress Notes (Signed)
[facility] ED Progress Note  07/01/2022 3:19 PM [name]  MRN:  [id number]   Principal Problem: Suicide attempt [facility]) Diagnosis:  Principal Problem:   Suicide attempt [facility])   ED Assessment Time Calculation: Start Time: 1735 Stop Time: 1800 Total Time in Minutes (Assessment Completion): 25   Subjective:  [name], 32 y.o., female patient seen face to face by this provider, consulted with Dr. Dwyane Dee; and chart reviewed on 07/01/22.  On evaluation [name] reports that she does not want to live in [place] anymore, states "I do not like [place] anymore ".  Patient states she wants to move to [facility], she used to live in [place] in a group home and she liked it better. Patient then [place] "people are not nice there".  Patient states he denied SI/HI/AVH, says she sleeps too much, but appetite is good.  Provider discussed with her resources that she could possibly be on a day such as 01-day program.  Patient states he is compliant with medications, says she is thinking about getting off her Effexor, because she does not like the way it makes her feel, informed patient to go to behavioral health urgent care and speak with providers there to help with medication management. During evaluation [name] is laying in her bed in no acute distress. She is alert, oriented x 4, calm, cooperative and attentive. Her mood is euthymic with congruent affect. She has normal speech, and behavior.  Objectively there is no evidence of psychosis/mania or delusional thinking.  Patient is able to converse coherently, goal directed thoughts, no distractibility, or pre-occupation. She denies suicidal/self-harm/homicidal ideation, psychosis, and paranoia.    Spoke with patient ACT team [facility] and they stated they saw patient yesterday, and she did discuss she wanted to move to [place], but she was not in any danger of herself  or anyone else, she has been compliant with medications.  Stated that we will follow-up with her today or tomorrow.    At this time [name] is educated and verbalizes understanding of mental health resources and other crisis services in the community. She is instructed to call 911 and present to the nearest emergency room should she experience any suicidal/homicidal ideation, auditory/visual/hallucinations, or detrimental worsening of her mental health condition.   Past Psychiatric History: schizoaffective disorder, bipolar type, Borderline Intellectual functioning, multiple intentional Suicide by OD and Generalized anxiety disorder.  [facility] are her outpatient providers.  Multiple inpatient Psychiatric hospitalizations and suicide attempts by OD.  Last Hospitalization was last week at [facility].    [place] Scale:  [place] [facility] from 06/30/2022 in [facility] Emergency Department at [facility] ED from 06/19/2022 in [facility] Emergency Department at [facility] ED from 06/15/2022 in [facility] [place] High Risk High Risk No Risk       Past Medical History:  Past Medical History:  Diagnosis Date   Anxiety    Bipolar 1 disorder ([place])    Cognitive deficits    Depression    Diabetes mellitus without complication ([place])    Hypertension    Mental disorder    Mental health disorder    Obesity     Past Surgical History:  Procedure Laterality Date   CESAREAN SECTION     CESAREAN SECTION N/A 04/25/2013   Procedure: REPEAT CESAREAN SECTION;  Surgeon: [name], MD;  Location: [facility];  Service: Obstetrics;  Laterality: N/A;   MASS EXCISION N/A 06/03/2012   Procedure: EXCISION MASS;  Surgeon: [name], MD;  Location: [facility];  Service: ENT;  Laterality: N/A;  Excision uvula mass   TONSILLECTOMY N/A 06/03/2012   Procedure: TONSILLECTOMY;  Surgeon: [name], MD;   Location: [facility];  Service: ENT;  Laterality: N/A;   TONSILLECTOMY     Family History:  Family History  Problem Relation Age of Onset   Hypertension Mother    Diabetes Father     Social History:  Social History   Substance and Sexual Activity  Alcohol Use Not Currently     Social History   Substance and Sexual Activity  Drug Use Not Currently   Types: "Crack" cocaine, Other-see comments   Comment: Patient reports hx of smoking Crack    Social History   Socioeconomic History   Marital status: Single    Spouse name: Not on file   Number of children: Not on file   Years of education: Not on file   Highest education level: Not on file  Occupational History   Not on file  Tobacco Use   Smoking status: Every Day    Types: Cigars   Smokeless tobacco: Never   Tobacco comments:    Pt declined  Vaping Use   Vaping Use: Never used  Substance and Sexual Activity   Alcohol use: Not Currently   Drug use: Not Currently    Types: "Crack" cocaine, Other-see comments    Comment: Patient reports hx of smoking Crack   Sexual activity: Not Currently    Birth control/protection: None    Comment: occasionally  Other Topics Concern   Not on file  Social History Narrative   ** Merged History Encounter **       Social Determinants of Health   Financial Resource Strain: Not on file  Food Insecurity: No Food Insecurity (04/10/2022)   Hunger Vital Sign    Worried About Running Out of Food in the Last Year: Never true    Ran Out of Food in the Last Year: Never true  Transportation Needs: No Transportation Needs (04/10/2022)   PRAPARE - Hydrologist (Medical): No    Lack of Transportation (Non-Medical): No  Physical Activity: Not on file  Stress: Not on file  Social Connections: Not on file    Sleep: Good  Appetite:  Good  Current Medications: No current facility-administered medications for this encounter.   Current  Outpatient Medications  Medication Sig Dispense Refill   amLODipine (NORVASC) 10 MG tablet Take 10 mg by mouth daily.     benztropine (COGENTIN) 0.5 MG tablet Take 1 tablet (0.5 mg total) by mouth 2 (two) times daily. For eps 60 tablet 0   Cholecalciferol (VITAMIN D3) 25 MCG (1000 UT) CAPS Take 1,000 Units by mouth daily.     clomiPRAMINE (ANAFRANIL) 25 MG capsule Take 25 mg by mouth at bedtime. Take 1 capsule at bedtime for 3 days then 2 capsules for 3 days then 3 capsules at bedtime     diclofenac Sodium (VOLTAREN) 1 % GEL Apply 2 g topically 4 (four) times daily. 50 g 0   gabapentin (NEURONTIN) 100 MG capsule Take 1 capsule (100 mg total) by mouth 3 (three) times daily. For agitation 90 capsule 0   haloperidol (HALDOL) 10 MG tablet Take 1 tablet (10 mg total) by mouth at bedtime. For mood stabilization 30  tablet 0   insulin aspart protamine- aspart (NOVOLOG MIX 70/30) (70-30) 100 UNIT/ML injection Inject 20 Units into the skin 2 (two) times daily with a meal.     lithium carbonate (ESKALITH) 450 MG ER tablet Take 1 tablet (450 mg total) by mouth at bedtime. For mood stabilization 30 tablet 0   metoprolol tartrate (LOPRESSOR) 25 MG tablet Take 12.5 mg by mouth 2 (two) times daily.     omeprazole (PRILOSEC) 20 MG capsule Take 20 mg by mouth daily.     ondansetron (ZOFRAN-ODT) 4 MG disintegrating tablet Take 1 tablet (4 mg total) by mouth every 8 (eight) hours as needed for nausea or vomiting. 10 tablet 0   simvastatin (ZOCOR) 40 MG tablet Take 40 mg by mouth at bedtime.     traZODone (DESYREL) 50 MG tablet Take 1 tablet (50 mg total) by mouth at bedtime as needed for sleep. 30 tablet 0   venlafaxine XR (EFFEXOR-XR) 150 MG 24 hr capsule Take 150 mg by mouth daily with breakfast.     venlafaxine XR (EFFEXOR-XR) 75 MG 24 hr capsule Take 1 capsule (75 mg total) by mouth daily with breakfast. For depression 30 capsule 0    Lab Results:  Results for orders placed or performed during the hospital  encounter of 06/30/22 (from the past 48 hour(s))  Comprehensive metabolic panel     Status: Abnormal   Collection Time: 06/30/22  4:50 PM  Result Value Ref Range   Sodium 139 135 - 145 mmol/L   Potassium 3.1 (L) 3.5 - 5.1 mmol/L   Chloride 106 98 - 111 mmol/L   CO2 26 22 - 32 mmol/L   Glucose, Bld 219 (H) 70 - 99 mg/dL    Comment: Glucose reference range applies only to samples taken after fasting for at least 8 hours.   BUN 6 6 - 20 mg/dL   Creatinine, Ser 0.81 0.44 - 1.00 mg/dL   Calcium 8.8 (L) 8.9 - 10.3 mg/dL   Total Protein 7.9 6.5 - 8.1 g/dL   Albumin 4.2 3.5 - 5.0 g/dL   AST 25 15 - 41 U/L   ALT 23 0 - 44 U/L   Alkaline Phosphatase 60 38 - 126 U/L   Total Bilirubin 0.6 0.3 - 1.2 mg/dL   GFR, Estimated >60 >60 mL/min    Comment: (NOTE) Calculated using the CKD-EPI Creatinine Equation (2021)    Anion gap 7 5 - 15    Comment: Performed at [facility], [place] [address], [place], [place] [address]  Ethanol     Status: None   Collection Time: 06/30/22  4:50 PM  Result Value Ref Range   Alcohol, Ethyl (B) <10 <10 mg/dL    Comment: (NOTE) Lowest detectable limit for serum alcohol is 10 mg/dL.  For medical purposes only. Performed at [facility], [place] [address], [place], [place] [address]   CBC with Diff     Status: Abnormal   Collection Time: 06/30/22  4:50 PM  Result Value Ref Range   WBC 9.6 4.0 - 10.5 K/uL   RBC 4.55 3.87 - 5.11 MIL/uL   Hemoglobin 10.8 (L) 12.0 - 15.0 g/dL   HCT 35.6 (L) 36.0 - 46.0 %   MCV 78.2 (L) 80.0 - 100.0 fL   MCH 23.7 (L) 26.0 - 34.0 pg   MCHC 30.3 30.0 - 36.0 g/dL   RDW 16.4 (H) 11.5 - 15.5 %   Platelets 242 150 - 400 K/uL   nRBC  0.0 0.0 - 0.2 %   Neutrophils Relative % 62 %   Neutro Abs 5.9 1.7 - 7.7 K/uL   Lymphocytes Relative 29 %   Lymphs Abs 2.8 0.7 - 4.0 K/uL   Monocytes Relative 5 %   Monocytes Absolute 0.5 0.1 - 1.0 K/uL   Eosinophils Relative 3 %   Eosinophils Absolute 0.3 0.0 - 0.5  K/uL   Basophils Relative 1 %   Basophils Absolute 0.1 0.0 - 0.1 K/uL   Immature Granulocytes 0 %   Abs Immature Granulocytes 0.02 0.00 - 0.07 K/uL    Comment: Performed at [facility], [place] [address] [place], [place] [address]  Group A Strep by PCR     Status: None   Collection Time: 06/30/22  5:03 PM   Specimen: Throat; Sterile Swab  Result Value Ref Range   Group A Strep by PCR NOT DETECTED NOT DETECTED    Comment: Performed at [facility], [place] [address], [place], [place] [address]  Resp panel by RT-PCR (RSV, Flu A&B, Covid) Anterior Nasal Swab     Status: None   Collection Time: 06/30/22  8:24 PM   Specimen: Anterior Nasal Swab  Result Value Ref Range   SARS Coronavirus 2 by RT PCR NEGATIVE NEGATIVE    Comment: (NOTE) SARS-CoV-2 target nucleic acids are NOT DETECTED.  The SARS-CoV-2 RNA is generally detectable in upper respiratory specimens during the acute phase of infection. The lowest concentration of SARS-CoV-2 viral copies this assay can detect is 138 copies/mL. A negative result does not preclude SARS-Cov-2 infection and should not be used as the sole basis for treatment or other patient management decisions. A negative result may occur with  improper specimen collection/handling, submission of specimen other than nasopharyngeal swab, presence of viral mutation(s) within the areas targeted by this assay, and inadequate number of viral copies(<138 copies/mL). A negative result must be combined with clinical observations, patient history, and epidemiological information. The expected result is Negative.  Fact Sheet for Patients:  EntrepreneurPulse.com.au  Fact Sheet for Healthcare Providers:  IncredibleEmployment.be  This test is no t yet approved or cleared by the [place] FDA and  has been authorized for detection and/or diagnosis of SARS-CoV-2 by FDA under an Emergency Use  Authorization (EUA). This EUA will remain  in effect (meaning this test can be used) for the duration of the COVID-19 declaration under Section 564(b)(1) of the Act, 21 U.S.C.section 360bbb-3(b)(1), unless the authorization is terminated  or revoked sooner.       Influenza A by PCR NEGATIVE NEGATIVE   Influenza B by PCR NEGATIVE NEGATIVE    Comment: (NOTE) The Xpert Xpress SARS-CoV-2/FLU/RSV plus assay is intended as an aid in the diagnosis of influenza from Nasopharyngeal swab specimens and should not be used as a sole basis for treatment. Nasal washings and aspirates are unacceptable for Xpert Xpress SARS-CoV-2/FLU/RSV testing.  Fact Sheet for Patients: EntrepreneurPulse.com.au  Fact Sheet for Healthcare Providers: IncredibleEmployment.be  This test is not yet approved or cleared by the [place] FDA and has been authorized for detection and/or diagnosis of SARS-CoV-2 by FDA under an Emergency Use Authorization (EUA). This EUA will remain in effect (meaning this test can be used) for the duration of the COVID-19 declaration under Section 564(b)(1) of the Act, 21 U.S.C. section 360bbb-3(b)(1), unless the authorization is terminated or revoked.     Resp Syncytial Virus by PCR NEGATIVE NEGATIVE    Comment: (NOTE) Fact Sheet for Patients: EntrepreneurPulse.com.au  Fact Sheet for Healthcare Providers: IncredibleEmployment.be  This test is not yet approved or cleared by the [place] FDA and has been authorized for detection and/or diagnosis of SARS-CoV-2 by FDA under an Emergency Use Authorization (EUA). This EUA will remain in effect (meaning this test can be used) for the duration of the COVID-19 declaration under Section 564(b)(1) of the Act, 21 U.S.C. section 360bbb-3(b)(1), unless the authorization is terminated or revoked.  Performed at [facility], [place] [address], [place], [place] [address]   Urine rapid drug screen (hosp performed)     Status: None   Collection Time: 06/30/22  8:44 PM  Result Value Ref Range   Opiates NONE DETECTED NONE DETECTED   Cocaine NONE DETECTED NONE DETECTED   Benzodiazepines NONE DETECTED NONE DETECTED   Amphetamines NONE DETECTED NONE DETECTED   Tetrahydrocannabinol NONE DETECTED NONE DETECTED   Barbiturates NONE DETECTED NONE DETECTED    Comment: (NOTE) DRUG SCREEN FOR MEDICAL PURPOSES ONLY.  IF CONFIRMATION IS NEEDED FOR ANY PURPOSE, NOTIFY LAB WITHIN 5 DAYS.  LOWEST DETECTABLE LIMITS FOR URINE DRUG SCREEN Drug Class                     Cutoff (ng/mL) Amphetamine and metabolites    1000 Barbiturate and metabolites    200 Benzodiazepine                 200 Opiates and metabolites        300 Cocaine and metabolites        300 THC                            50 Performed at [facility], [place] [address] [place], [place] [address]     Blood Alcohol level:  Lab Results  Component Value Date   [facility] <10 06/30/2022   ETH <10 06/19/2022    Physical Findings:  CIWA:    COWS:     Musculoskeletal: Strength & Muscle Tone: within normal limits Gait & Station: normal Patient leans: N/A  Psychiatric Specialty Exam:  Presentation  General Appearance:  Appropriate for Environment  Eye Contact: Good  Speech: Clear and Coherent  Speech Volume: Normal  Handedness: Right   Mood and Affect  Mood: Euthymic  Affect: Appropriate   Thought Process  Thought Processes: Coherent  Descriptions of Associations:Intact  Orientation:Full (Time, Place and Person)  Thought Content:Scattered  History of Schizophrenia/Schizoaffective disorder:No  Duration of Psychotic Symptoms:N/A  Hallucinations:Hallucinations: None  Ideas of Reference:None  Suicidal Thoughts:Suicidal Thoughts: No SI Active Intent and/or Plan: With Intent; With Plan; With Means to Carry Out; With Access to  Means  Homicidal Thoughts:Homicidal Thoughts: No   Sensorium  Memory: Immediate Good; Recent Good  Judgment: Fair  Insight: Fair   Executive Functions  Concentration: Good  Attention Span: Good  Recall: Good  Fund of Knowledge: Good  Language: Good   Psychomotor Activity  Psychomotor Activity: Psychomotor Activity: Normal   Assets  Assets: Communication Skills; Social Support; Housing   Sleep  Sleep: Sleep: Good    Physical Exam: Physical Exam Vitals and nursing note reviewed.  HENT:     Mouth/Throat:     Mouth: Mucous membranes are moist.  Musculoskeletal:     Cervical back: Normal range of motion.  Neurological:     Mental Status: She is alert.  Psychiatric:        Mood and Affect: Mood normal.  Behavior: Behavior normal.        Thought Content: Thought content normal.        Judgment: Judgment normal.    Review of Systems  Constitutional: Negative.   HENT: Negative.    Respiratory: Negative.    Musculoskeletal: Negative.   Psychiatric/Behavioral: Negative.     Blood pressure (!) 137/90, pulse 98, temperature 98.6 F (37 C), temperature source Oral, resp. rate 18, last menstrual period 05/25/2022, SpO2 97 %. There is no height or weight on file to calculate BMI.   Medical Decision Making: Patient is psychiatrically cleared. Patient denies SI, HI, AVH and is able to contract for safety. She demonstrated no overt evidence of psychosis or mania. Prior to discharge, she verbalized that they understood warning signs, triggers, and symptoms of worsening mental health and how to access emergency mental health care if they felt it was needed. Patient was instructed to call 911 or return to the emergency room if they experienced any concerning symptoms after discharge. Patient voiced understanding and agreed to the above.   Disposition: Patient does not meet criteria for psychiatric inpatient admission. Supportive therapy provided about  ongoing stressors. Discussed crisis plan, support from social network, calling 911, coming to the Emergency Department, and calling Suicide Hotline.    Alphonso Gregson MOTLEY-MANGRUM, PMHNP 07/01/2022, 3:19 PM

## 2022-07-01 NOTE — ED Provider Notes (Signed)
Emergency Medicine Observation Re-evaluation Note  [name] is a 32 y.o. female, seen on rounds today.  Pt initially presented to the ED for complaints of Ingestion and Suicide Attempt Currently, the patient is asleep.  Physical Exam  BP 111/70 (BP Location: Right Arm)   Pulse 92   Temp 97.7 F (36.5 C) (Oral)   Resp 20   LMP 05/25/2022 (Approximate)   SpO2 98%  Physical Exam General: No acute distress Cardiac: Regular rate Lungs: No respiratory distress Psych: calm  ED Course / MDM  EKG:   I have reviewed the labs performed to date as well as medications administered while in observation.  Recent changes in the last 24 hours include .  Plan  Current plan is for asleep.  Patient is able to get reassessed again this morning by psychiatry service.    Varney Biles, MD 07/01/22 [phone]

## 2022-07-01 NOTE — Discharge Instructions (Signed)
Discharge recommendations:  Patient is to take medications as prescribed. Please see information for follow-up appointment with psychiatry and therapy. Please follow up with your primary care provider for all medical related needs.   Therapy: We recommend that patient participate in individual therapy to address mental health concerns.  Medications: The patient or guardian is to contact a medical professional and/or outpatient provider to address any new side effects that develop. The patient or guardian should update outpatient providers of any new medications and/or medication changes.   Atypical antipsychotics: If you are prescribed an atypical antipsychotic, it is recommended that your height, weight, BMI, blood pressure, fasting lipid panel, and fasting blood sugar be monitored by your outpatient providers.  Safety:  The patient should abstain from use of illicit substances/drugs and abuse of any medications. If symptoms worsen or do not continue to improve or if the patient becomes actively suicidal or homicidal then it is recommended that the patient return to the closest hospital emergency department, the [facility], or call 911 for further evaluation and treatment. National Suicide Prevention Lifeline 1-800-SUICIDE or 682-180-0245.  About 988 988 offers 24/7 access to trained crisis counselors who can help people experiencing mental health-related distress. People can call or text 988 or chat 988lifeline.org for themselves or if they are worried about a loved one who may need crisis support.  Crisis Mobile: [facility]:                     [phone] (for crisis response 24 hours a day) [place]:                                            [phone]

## 2022-07-02 ENCOUNTER — Other Ambulatory Visit: Payer: Self-pay

## 2022-07-02 ENCOUNTER — Emergency Department (HOSPITAL_COMMUNITY)
Admission: EM | Admit: 2022-07-02 | Discharge: 2022-07-03 | Disposition: A | Discharge status: Home / Self Care | Payer: Medicaid Other | Attending: Emergency Medicine | Admitting: Emergency Medicine

## 2022-07-02 DIAGNOSIS — T1491XA Suicide attempt, initial encounter: Secondary | ICD-10-CM | POA: Diagnosis not present

## 2022-07-02 DIAGNOSIS — Z794 Long term (current) use of insulin: Secondary | ICD-10-CM | POA: Insufficient documentation

## 2022-07-02 DIAGNOSIS — R45851 Suicidal ideations: Secondary | ICD-10-CM | POA: Insufficient documentation

## 2022-07-02 DIAGNOSIS — Z765 Malingerer [conscious simulation]: Secondary | ICD-10-CM | POA: Diagnosis not present

## 2022-07-02 NOTE — ED Triage Notes (Addendum)
Pt arrives via EMS yelling "yall don't know who the fuck you dealing with"  " I can't sleep so why the fuck can they sleep"  EMS picked up from home. Called out for SI.  CBG 408  Denies SI with RN.  Pt screaming and walking up and down hall shouting obscenities.  Refused vital signs.  ED Provider Tyrone Nine at bedside on arrival.

## 2022-07-02 NOTE — ED Provider Notes (Signed)
[facility] Provider Note   CSN: [id number] Arrival date & time: 07/02/22  2341     History  Chief Complaint  Patient presents with   Suicidal    Paw Catena Petruzzelli is a 32 y.o. female.  32 yo F with a chief complaints of suicidal ideation.  This was reported to EMS.  Soon as the patient got here she realized she did not have a bed and she got up and started yelling.  I reviewed the patient's medical record and she was discharged yesterday afternoon and not thought to be suicidal.  This is actually the patient's third visit this week for same.        Home Medications Prior to Admission medications   Medication Sig Start Date End Date Taking? Authorizing Provider  amLODipine (NORVASC) 10 MG tablet Take 10 mg by mouth daily.    [provider]  benztropine (COGENTIN) 0.5 MG tablet Take 1 tablet (0.5 mg total) by mouth 2 (two) times daily. For eps 04/14/22   Lindell Spar I, NP  Cholecalciferol (VITAMIN D3) 25 MCG (1000 UT) CAPS Take 1,000 Units by mouth daily.    [provider]  clomiPRAMINE (ANAFRANIL) 25 MG capsule Take 25 mg by mouth at bedtime. Take 1 capsule at bedtime for 3 days then 2 capsules for 3 days then 3 capsules at bedtime    [provider]  diclofenac Sodium (VOLTAREN) 1 % GEL Apply 2 g topically 4 (four) times daily. 06/07/22   Antonietta Breach, PA-C  gabapentin (NEURONTIN) 100 MG capsule Take 1 capsule (100 mg total) by mouth 3 (three) times daily. For agitation 04/14/22   Lindell Spar I, NP  haloperidol (HALDOL) 10 MG tablet Take 1 tablet (10 mg total) by mouth at bedtime. For mood stabilization 04/14/22   Nwoko, Herbert Pun I, NP  insulin aspart protamine- aspart (NOVOLOG MIX 70/30) (70-30) 100 UNIT/ML injection Inject 20 Units into the skin 2 (two) times daily with a meal.    [provider]  lithium carbonate (ESKALITH) 450 MG ER tablet Take 1 tablet (450 mg total) by mouth at bedtime. For mood  stabilization 04/14/22   Lindell Spar I, NP  metoprolol tartrate (LOPRESSOR) 25 MG tablet Take 12.5 mg by mouth 2 (two) times daily.    [provider]  omeprazole (PRILOSEC) 20 MG capsule Take 20 mg by mouth daily.    [provider]  ondansetron (ZOFRAN-ODT) 4 MG disintegrating tablet Take 1 tablet (4 mg total) by mouth every 8 (eight) hours as needed for nausea or vomiting. 06/15/22   Barrett Henle, MD  simvastatin (ZOCOR) 40 MG tablet Take 40 mg by mouth at bedtime.    [provider]  traZODone (DESYREL) 50 MG tablet Take 1 tablet (50 mg total) by mouth at bedtime as needed for sleep. 04/14/22   Lindell Spar I, NP  venlafaxine XR (EFFEXOR-XR) 150 MG 24 hr capsule Take 150 mg by mouth daily with breakfast.    [provider]  venlafaxine XR (EFFEXOR-XR) 75 MG 24 hr capsule Take 1 capsule (75 mg total) by mouth daily with breakfast. For depression 04/15/22   Lindell Spar I, NP      Allergies    Wellbutrin [bupropion], Omnipaque [iohexol], Penicillins, Atarax [hydroxyzine], Contrast media [iodinated contrast media], and Cymbalta [duloxetine hcl]    Review of Systems   Review of Systems  Physical Exam Updated Vital Signs LMP 05/25/2022 (Approximate)  Physical Exam Vitals and nursing  note reviewed.  Constitutional:      General: She is not in acute distress.    Appearance: She is well-developed. She is not diaphoretic.  HENT:     Head: Normocephalic and atraumatic.  Eyes:     Pupils: Pupils are equal, round, and reactive to light.  Cardiovascular:     Heart sounds: No murmur heard.    No friction rub. No gallop.  Pulmonary:     Effort: Pulmonary effort is normal.     Breath sounds: No wheezing or rales.  Abdominal:     General: There is no distension.     Palpations: Abdomen is soft.     Tenderness: There is no abdominal tenderness.  Musculoskeletal:        General: No tenderness.     Cervical back: Normal range of motion and neck  supple.  Skin:    General: Skin is warm and dry.  Neurological:     Mental Status: She is alert and oriented to person, place, and time.     ED Results / Procedures / Treatments   Labs (all labs ordered are listed, but only abnormal results are displayed) Labs Reviewed - No data to display  EKG None  Radiology No results found.  Procedures Procedures    Medications Ordered in ED Medications - No data to display  ED Course/ Medical Decision Making/ A&P                             Medical Decision Making  32 yo F well-known to this emergency department with a care plan in 29 visits in the past 6 months comes in with a chief complaint of suicidal ideation.  I reviewed the patient's medical record, she was just seen 48 hours ago for the same.  Was medically cleared and then psychiatrically cleared yesterday afternoon.  It seems during the discussion with the psychiatrist that she just did not want to stay in [place] and she did not actually have suicidal ideation.  Upon arrival here when she realized that she did not have a bed to stay and she started yelling and screaming and demanding a place to lie down.  I worry that she is here for secondary gain and do not feel that further workup should be started for her.  She was given a list of outpatient resources.  11:51 PM:  I have discussed the diagnosis/risks/treatment options with the patient.  Evaluation and diagnostic testing in the emergency department does not suggest an emergent condition requiring admission or immediate intervention beyond what has been performed at this time.  They will follow up with PCP. We also discussed returning to the ED immediately if new or worsening sx occur. We discussed the sx which are most concerning (e.g., sudden worsening pain, fever, inability to tolerate by mouth) that necessitate immediate return. Medications administered to the patient during their visit and any new prescriptions provided to  the patient are listed below.  Medications given during this visit Medications - No data to display   The patient appears reasonably screen and/or stabilized for discharge and I doubt any other medical condition or other [facility] requiring further screening, evaluation, or treatment in the ED at this time prior to discharge.          Final Clinical Impression(s) / ED Diagnoses Final diagnoses:  Malingering    Rx / DC Orders ED Discharge Orders     None  Deno Etienne, DO 07/02/22 2351

## 2022-07-03 ENCOUNTER — Ambulatory Visit (HOSPITAL_COMMUNITY)
Admission: EM | Admit: 2022-07-03 | Discharge: 2022-07-03 | Disposition: A | Discharge status: Home / Self Care | Payer: Medicaid Other | Attending: Psychiatry | Admitting: Psychiatry

## 2022-07-03 DIAGNOSIS — F319 Bipolar disorder, unspecified: Secondary | ICD-10-CM | POA: Insufficient documentation

## 2022-07-03 DIAGNOSIS — R45851 Suicidal ideations: Secondary | ICD-10-CM | POA: Diagnosis not present

## 2022-07-03 DIAGNOSIS — F201 Disorganized schizophrenia: Secondary | ICD-10-CM | POA: Insufficient documentation

## 2022-07-03 DIAGNOSIS — R4589 Other symptoms and signs involving emotional state: Secondary | ICD-10-CM

## 2022-07-03 DIAGNOSIS — Z1152 Encounter for screening for COVID-19: Secondary | ICD-10-CM | POA: Diagnosis not present

## 2022-07-03 DIAGNOSIS — R451 Restlessness and agitation: Secondary | ICD-10-CM

## 2022-07-03 LAB — GLUCOSE, CAPILLARY: Glucose-Capillary: 280 mg/dL — ABNORMAL HIGH (ref 70–99)

## 2022-07-03 LAB — RESP PANEL BY RT-PCR (RSV, FLU A&B, COVID)  RVPGX2
Influenza A by PCR: NEGATIVE
Influenza B by PCR: NEGATIVE
Resp Syncytial Virus by PCR: NEGATIVE
SARS Coronavirus 2 by RT PCR: NEGATIVE

## 2022-07-03 LAB — POCT URINE DRUG SCREEN - MANUAL ENTRY (I-SCREEN)
POC Amphetamine UR: NOT DETECTED
POC Buprenorphine (BUP): NOT DETECTED
POC Cocaine UR: NOT DETECTED
POC Marijuana UR: NOT DETECTED
POC Methadone UR: NOT DETECTED
POC Methamphetamine UR: NOT DETECTED
POC Morphine: NOT DETECTED
POC Oxazepam (BZO): NOT DETECTED
POC Oxycodone UR: NOT DETECTED
POC Secobarbital (BAR): NOT DETECTED

## 2022-07-03 LAB — POC SARS CORONAVIRUS 2 AG: SARSCOV2ONAVIRUS 2 AG: NEGATIVE

## 2022-07-03 LAB — POCT PREGNANCY, URINE: Preg Test, Ur: NEGATIVE

## 2022-07-03 LAB — POC URINE PREG, ED: Preg Test, Ur: NEGATIVE

## 2022-07-03 MED ORDER — LITHIUM CARBONATE ER 450 MG PO TBCR
450.0000 mg | EXTENDED_RELEASE_TABLET | Freq: Every day | ORAL | Status: DC
  Administered 2022-07-03: 450 mg via ORAL
  Filled 2022-07-03: qty 1

## 2022-07-03 MED ORDER — ZIPRASIDONE MESYLATE 20 MG IM SOLR: 20.0000 mg | INTRAMUSCULAR | Status: DC | PRN

## 2022-07-03 MED ORDER — ACETAMINOPHEN 325 MG PO TABS: 650.0000 mg | ORAL_TABLET | Freq: Four times a day (QID) | ORAL | Status: DC | PRN

## 2022-07-03 MED ORDER — GABAPENTIN 100 MG PO CAPS
100.0000 mg | ORAL_CAPSULE | Freq: Three times a day (TID) | ORAL | Status: DC
  Administered 2022-07-03: 100 mg via ORAL
  Filled 2022-07-03: qty 1

## 2022-07-03 MED ORDER — ALUM & MAG HYDROXIDE-SIMETH 200-200-20 MG/5ML PO SUSP: 30.0000 mL | ORAL | Status: DC | PRN

## 2022-07-03 MED ORDER — INSULIN ASPART PROT & ASPART (70-30 MIX) 100 UNIT/ML ~~LOC~~ SUSP
20.0000 [IU] | Freq: Two times a day (BID) | SUBCUTANEOUS | Status: DC
  Administered 2022-07-03: 20 [IU] via SUBCUTANEOUS

## 2022-07-03 MED ORDER — CLOMIPRAMINE HCL 25 MG PO CAPS
25.0000 mg | ORAL_CAPSULE | Freq: Every day | ORAL | Status: DC
  Administered 2022-07-03: 25 mg via ORAL
  Filled 2022-07-03: qty 1

## 2022-07-03 MED ORDER — VENLAFAXINE HCL ER 150 MG PO CP24
150.0000 mg | ORAL_CAPSULE | Freq: Every day | ORAL | Status: DC
  Filled 2022-07-03: qty 1

## 2022-07-03 MED ORDER — ONDANSETRON 4 MG PO TBDP: 4.0000 mg | ORAL_TABLET | Freq: Three times a day (TID) | ORAL | Status: DC | PRN

## 2022-07-03 MED ORDER — PANTOPRAZOLE SODIUM 40 MG PO TBEC
40.0000 mg | DELAYED_RELEASE_TABLET | Freq: Every day | ORAL | Status: DC
  Administered 2022-07-03: 40 mg via ORAL
  Filled 2022-07-03: qty 1

## 2022-07-03 MED ORDER — SIMVASTATIN 20 MG PO TABS
40.0000 mg | ORAL_TABLET | Freq: Every day | ORAL | Status: DC
  Administered 2022-07-03: 40 mg via ORAL
  Filled 2022-07-03: qty 2

## 2022-07-03 MED ORDER — TRAZODONE HCL 50 MG PO TABS: 50.0000 mg | ORAL_TABLET | Freq: Every evening | ORAL | Status: DC | PRN

## 2022-07-03 MED ORDER — BENZTROPINE MESYLATE 0.5 MG PO TABS
0.5000 mg | ORAL_TABLET | Freq: Two times a day (BID) | ORAL | Status: DC
  Administered 2022-07-03 (×2): 0.5 mg via ORAL
  Filled 2022-07-03 (×2): qty 1

## 2022-07-03 MED ORDER — VENLAFAXINE HCL ER 75 MG PO CP24
75.0000 mg | ORAL_CAPSULE | Freq: Every day | ORAL | Status: DC
  Filled 2022-07-03: qty 1

## 2022-07-03 MED ORDER — AMLODIPINE BESYLATE 10 MG PO TABS
10.0000 mg | ORAL_TABLET | Freq: Every day | ORAL | Status: DC
  Administered 2022-07-03: 10 mg via ORAL
  Filled 2022-07-03: qty 1

## 2022-07-03 MED ORDER — VITAMIN D 25 MCG (1000 UNIT) PO TABS
1000.0000 [IU] | ORAL_TABLET | Freq: Every day | ORAL | Status: DC
  Administered 2022-07-03: 1000 [IU] via ORAL
  Filled 2022-07-03: qty 1

## 2022-07-03 MED ORDER — METOPROLOL TARTRATE 25 MG PO TABS
12.5000 mg | ORAL_TABLET | Freq: Two times a day (BID) | ORAL | Status: DC
  Administered 2022-07-03 (×2): 12.5 mg via ORAL
  Filled 2022-07-03 (×2): qty 1

## 2022-07-03 MED ORDER — LORAZEPAM 1 MG PO TABS: 1.0000 mg | ORAL_TABLET | ORAL | Status: DC | PRN

## 2022-07-03 MED ORDER — OLANZAPINE 10 MG PO TBDP: 10.0000 mg | ORAL_TABLET | Freq: Three times a day (TID) | ORAL | Status: DC | PRN

## 2022-07-03 MED ORDER — MAGNESIUM HYDROXIDE 400 MG/5ML PO SUSP: 30.0000 mL | Freq: Every day | ORAL | Status: DC | PRN

## 2022-07-03 MED ORDER — HALOPERIDOL 5 MG PO TABS
10.0000 mg | ORAL_TABLET | Freq: Every day | ORAL | Status: DC
  Administered 2022-07-03: 10 mg via ORAL
  Filled 2022-07-03: qty 2

## 2022-07-03 NOTE — ED Provider Notes (Signed)
[facility] Continuous Assessment Admission H&P  Date: 07/03/22 Patient Name: [name] MRN: [id number] Chief Complaint: suicidal to OD on my meds  Diagnoses:  Final diagnoses:  Suicidal ideation  Agitation  Anxious appearance    HPI: [name], 32 y/o female with a history of bipolar dx, schizoaffective disorder, acute psychosis suicidal ideation, malingering.  Presented to [facility] via [facility].  According to the patient she is suicidal to overdose on her medicine when asked why she is suicidal patient stated my boyfriend has been cheating on me with both men and woman and she is tired of it.  Patient does have an extensive ED visits, patient lives alone.  According to patient she does have an ACT team.  According to the patient she last saw her ACT team today but she does not think her medication is working and the ACT team tried to get in touch with a psychiatrist but no answer.  According to the patient she has lost some weight but she does not know how much weight patient stated she has been sleeping up all the time.  Face-to-face observation of patient, patient is alert and oriented x 4, speech is clear however patient can become agitated really easily and her voice does increase with agitation.  Patient is easily redirected when spoken to.  Patient does not answer all questions appropriately.  Patient endorsed suicidal ideation to overdose on her medications.  Patient denies HI, AVH.  Patient does seem to be paranoid because she is stated that people tell her that her boyfriend is cheating on her.  Patient denies alcohol use, reports she only smokes cigarettes.  According to patient she already took her night medication before coming here.  Patient does not seem to be influenced by internal or external stimuli at this time.  No blood work needed,  pt had lab done 06/30/22  Recommend inpatient observation.  Total Time spent with patient: 30 minutes  Musculoskeletal  Strength & Muscle  Tone: within normal limits Gait & Station: normal Patient leans: N/A  Psychiatric Specialty Exam  Presentation General Appearance:  Casual  Eye Contact: Good  Speech: Clear and Coherent  Speech Volume: Increased  Handedness: Right   Mood and Affect  Mood: Anxious; Angry  Affect: Blunt   Thought Process  Thought Processes: Coherent  Descriptions of Associations:Circumstantial  Orientation:Full (Time, Place and Person)  Thought Content:Paranoid Ideation; Rumination  Diagnosis of Schizophrenia or Schizoaffective disorder in past: Yes  Duration of Psychotic Symptoms: Greater than six months  Hallucinations:Hallucinations: None  Ideas of Reference:Paranoia  Suicidal Thoughts:Suicidal Thoughts: Yes, Active SI Active Intent and/or Plan: With Intent; With Plan  Homicidal Thoughts:Homicidal Thoughts: No   Sensorium  Memory: Immediate Fair  Judgment: Poor  Insight: Fair   Materials engineer: Fair  Attention Span: Good  Recall: Good  Fund of Knowledge: Good  Language: Good   Psychomotor Activity  Psychomotor Activity: Psychomotor Activity: Normal   Assets  Assets: Desire for Improvement; Social Support; Resilience   Sleep  Sleep: Sleep: Good Number of Hours of Sleep: 8   Nutritional Assessment (For OBS and FBC admissions only) Has the patient had a weight loss or gain of 10 pounds or more in the last 3 months?: Yes Has the patient had a decrease in food intake/or appetite?: No Does the patient have dental problems?: No Does the patient have eating habits or behaviors that may be indicators of an eating disorder including binging or inducing vomiting?: No Has the  patient recently lost weight without trying?: 0 Has the patient been eating poorly because of a decreased appetite?: 0 Malnutrition Screening Tool Score: 0    Physical Exam HENT:     Head: Normocephalic.     Nose: Nose normal.  Cardiovascular:      Rate and Rhythm: Normal rate.  Pulmonary:     Effort: Pulmonary effort is normal.  Musculoskeletal:        General: Normal range of motion.     Cervical back: Normal range of motion.  Skin:    General: Skin is warm.  Neurological:     General: No focal deficit present.     Mental Status: She is alert.  Psychiatric:        Mood and Affect: Mood normal.        Behavior: Behavior normal.        Thought Content: Thought content normal.        Judgment: Judgment normal.    Review of Systems  Constitutional: Negative.   HENT: Negative.    Eyes: Negative.   Respiratory: Negative.    Cardiovascular: Negative.   Gastrointestinal: Negative.   Genitourinary: Negative.   Musculoskeletal: Negative.   Skin: Negative.   Neurological: Negative.   Psychiatric/Behavioral:  Positive for depression and suicidal ideas. The patient is nervous/anxious.     Blood pressure (!) 135/98, pulse (!) 110, temperature 98.8 F (37.1 C), temperature source Oral, resp. rate 20, last menstrual period 05/25/2022, SpO2 99 %. There is no height or weight on file to calculate BMI.  Past Psychiatric History: Schizoaffective disorder, bipolar disorder, malingering, psychosis,  Is the patient at risk to self? Yes  Has the patient been a risk to self in the past 6 months? Yes .    Has the patient been a risk to self within the distant past? Yes   Is the patient a risk to others? No   Has the patient been a risk to others in the past 6 months? No   Has the patient been a risk to others within the distant past? No   Past Medical History: See history  Family History: Unknown  Social History: Cigarette smoking  Last Labs:  Admission on 07/03/2022  Component Date Value Ref Range Status   SARS Coronavirus 2 by RT PCR 07/03/2022 NEGATIVE  NEGATIVE Final   Influenza A by PCR 07/03/2022 NEGATIVE  NEGATIVE Final   Influenza B by PCR 07/03/2022 NEGATIVE  NEGATIVE Final   Comment: (NOTE) The Xpert Xpress  SARS-CoV-2/FLU/RSV plus assay is intended as an aid in the diagnosis of influenza from Nasopharyngeal swab specimens and should not be used as a sole basis for treatment. Nasal washings and aspirates are unacceptable for Xpert Xpress SARS-CoV-2/FLU/RSV testing.  Fact Sheet for Patients: EntrepreneurPulse.com.au  Fact Sheet for Healthcare Providers: IncredibleEmployment.be  This test is not yet approved or cleared by the [place] FDA and has been authorized for detection and/or diagnosis of SARS-CoV-2 by FDA under an Emergency Use Authorization (EUA). This EUA will remain in effect (meaning this test can be used) for the duration of the COVID-19 declaration under Section 564(b)(1) of the Act, 21 U.S.C. section 360bbb-3(b)(1), unless the authorization is terminated or revoked.     Resp Syncytial Virus by PCR 07/03/2022 NEGATIVE  NEGATIVE Final   Comment: (NOTE) Fact Sheet for Patients: EntrepreneurPulse.com.au  Fact Sheet for Healthcare Providers: IncredibleEmployment.be  This test is not yet approved or cleared by the [place] and has been authorized for  detection and/or diagnosis of SARS-CoV-2 by FDA under an Emergency Use Authorization (EUA). This EUA will remain in effect (meaning this test can be used) for the duration of the COVID-19 declaration under Section 564(b)(1) of the Act, 21 U.S.C. section 360bbb-3(b)(1), unless the authorization is terminated or revoked.  Performed at [facility], [place] [address], [place], [place] [address]    POC Amphetamine UR 07/03/2022 None Detected  NONE DETECTED (Cut Off Level 1000 ng/mL) Final   POC Secobarbital (BAR) 07/03/2022 None Detected  NONE DETECTED (Cut Off Level 300 ng/mL) Final   POC Buprenorphine (BUP) 07/03/2022 None Detected  NONE DETECTED (Cut Off Level 10 ng/mL) Final   POC Oxazepam (BZO) 07/03/2022 None Detected  NONE DETECTED (Cut  Off Level 300 ng/mL) Final   POC Cocaine UR 07/03/2022 None Detected  NONE DETECTED (Cut Off Level 300 ng/mL) Final   POC Methamphetamine UR 07/03/2022 None Detected  NONE DETECTED (Cut Off Level 1000 ng/mL) Final   POC Morphine 07/03/2022 None Detected  NONE DETECTED (Cut Off Level 300 ng/mL) Final   POC Methadone UR 07/03/2022 None Detected  NONE DETECTED (Cut Off Level 300 ng/mL) Final   POC Oxycodone UR 07/03/2022 None Detected  NONE DETECTED (Cut Off Level 100 ng/mL) Final   POC Marijuana UR 07/03/2022 None Detected  NONE DETECTED (Cut Off Level 50 ng/mL) Final   Preg Test, Ur 07/03/2022 Negative  Negative Final   SARSCOV2ONAVIRUS 2 AG 07/03/2022 NEGATIVE  NEGATIVE Final   Comment: (NOTE) SARS-CoV-2 antigen NOT DETECTED.   Negative results are presumptive.  Negative results do not preclude SARS-CoV-2 infection and should not be used as the sole basis for treatment or other patient management decisions, including infection  control decisions, particularly in the presence of clinical signs and  symptoms consistent with COVID-19, or in those who have been in contact with the virus.  Negative results must be combined with clinical observations, patient history, and epidemiological information. The expected result is Negative.  Fact Sheet for Patients: HandmadeRecipes.com.cy  Fact Sheet for Healthcare Providers: FuneralLife.at  This test is not yet approved or cleared by the [place] FDA and  has been authorized for detection and/or diagnosis of SARS-CoV-2 by FDA under an Emergency Use Authorization (EUA).  This EUA will remain in effect (meaning this test can be used) for the duration of  the COV                          ID-19 declaration under Section 564(b)(1) of the Act, 21 U.S.C. section 360bbb-3(b)(1), unless the authorization is terminated or revoked sooner.     Preg Test, Ur 07/03/2022 NEGATIVE  NEGATIVE Final   Comment:         THE SENSITIVITY OF THIS METHODOLOGY IS >24 mIU/mL   Admission on 06/30/2022, Discharged on 07/01/2022  Component Date Value Ref Range Status   Sodium 06/30/2022 139  135 - 145 mmol/L Final   Potassium 06/30/2022 3.1 (L)  3.5 - 5.1 mmol/L Final   Chloride 06/30/2022 106  98 - 111 mmol/L Final   CO2 06/30/2022 26  22 - 32 mmol/L Final   Glucose, Bld 06/30/2022 219 (H)  70 - 99 mg/dL Final   Glucose reference range applies only to samples taken after fasting for at least 8 hours.   BUN 06/30/2022 6  6 - 20 mg/dL Final   Creatinine, Ser 06/30/2022 0.81  0.44 - 1.00 mg/dL Final   Calcium 06/30/2022 8.8 (  L)  8.9 - 10.3 mg/dL Final   Total Protein 06/30/2022 7.9  6.5 - 8.1 g/dL Final   Albumin 06/30/2022 4.2  3.5 - 5.0 g/dL Final   AST 06/30/2022 25  15 - 41 U/L Final   ALT 06/30/2022 23  0 - 44 U/L Final   Alkaline Phosphatase 06/30/2022 60  38 - 126 U/L Final   Total Bilirubin 06/30/2022 0.6  0.3 - 1.2 mg/dL Final   GFR, Estimated 06/30/2022 >60  >60 mL/min Final   Comment: (NOTE) Calculated using the CKD-EPI Creatinine Equation (2021)    Anion gap 06/30/2022 7  5 - 15 Final   Performed at [facility], [place] [address] [place], [place] [address]   Alcohol, Ethyl (B) 06/30/2022 <10  <10 mg/dL Final   Comment: (NOTE) Lowest detectable limit for serum alcohol is 10 mg/dL.  For medical purposes only. Performed at [facility], [place] [address], [place], [place] [address]    Opiates 06/30/2022 NONE DETECTED  NONE DETECTED Final   Cocaine 06/30/2022 NONE DETECTED  NONE DETECTED Final   Benzodiazepines 06/30/2022 NONE DETECTED  NONE DETECTED Final   Amphetamines 06/30/2022 NONE DETECTED  NONE DETECTED Final   Tetrahydrocannabinol 06/30/2022 NONE DETECTED  NONE DETECTED Final   Barbiturates 06/30/2022 NONE DETECTED  NONE DETECTED Final   Comment: (NOTE) DRUG SCREEN FOR MEDICAL PURPOSES ONLY.  IF CONFIRMATION IS NEEDED FOR ANY PURPOSE, NOTIFY  LAB WITHIN 5 DAYS.  LOWEST DETECTABLE LIMITS FOR URINE DRUG SCREEN Drug Class                     Cutoff (ng/mL) Amphetamine and metabolites    1000 Barbiturate and metabolites    200 Benzodiazepine                 200 Opiates and metabolites        300 Cocaine and metabolites        300 THC                            50 Performed at [facility], [place] [address] [place], [place] [address]    WBC 06/30/2022 9.6  4.0 - 10.5 K/uL Final   RBC 06/30/2022 4.55  3.87 - 5.11 MIL/uL Final   Hemoglobin 06/30/2022 10.8 (L)  12.0 - 15.0 g/dL Final   HCT 06/30/2022 35.6 (L)  36.0 - 46.0 % Final   MCV 06/30/2022 78.2 (L)  80.0 - 100.0 fL Final   MCH 06/30/2022 23.7 (L)  26.0 - 34.0 pg Final   MCHC 06/30/2022 30.3  30.0 - 36.0 g/dL Final   RDW 06/30/2022 16.4 (H)  11.5 - 15.5 % Final   Platelets 06/30/2022 242  150 - 400 K/uL Final   nRBC 06/30/2022 0.0  0.0 - 0.2 % Final   Neutrophils Relative % 06/30/2022 62  % Final   Neutro Abs 06/30/2022 5.9  1.7 - 7.7 K/uL Final   Lymphocytes Relative 06/30/2022 29  % Final   Lymphs Abs 06/30/2022 2.8  0.7 - 4.0 K/uL Final   Monocytes Relative 06/30/2022 5  % Final   Monocytes Absolute 06/30/2022 0.5  0.1 - 1.0 K/uL Final   Eosinophils Relative 06/30/2022 3  % Final   Eosinophils Absolute 06/30/2022 0.3  0.0 - 0.5 K/uL Final   Basophils Relative 06/30/2022 1  % Final   Basophils Absolute 06/30/2022 0.1  0.0 - 0.1 K/uL  Final   Immature Granulocytes 06/30/2022 0  % Final   Abs Immature Granulocytes 06/30/2022 0.02  0.00 - 0.07 K/uL Final   Performed at [facility], [place] [address], [place], [place] [address]   Group A Strep by PCR 06/30/2022 NOT DETECTED  NOT DETECTED Final   Performed at [place] [address] [place], [place] [address]   SARS Coronavirus 2 by RT PCR 06/30/2022 NEGATIVE  NEGATIVE Final   Comment: (NOTE) SARS-CoV-2 target nucleic acids are NOT DETECTED.  The SARS-CoV-2  RNA is generally detectable in upper respiratory specimens during the acute phase of infection. The lowest concentration of SARS-CoV-2 viral copies this assay can detect is 138 copies/mL. A negative result does not preclude SARS-Cov-2 infection and should not be used as the sole basis for treatment or other patient management decisions. A negative result may occur with  improper specimen collection/handling, submission of specimen other than nasopharyngeal swab, presence of viral mutation(s) within the areas targeted by this assay, and inadequate number of viral copies(<138 copies/mL). A negative result must be combined with clinical observations, patient history, and epidemiological information. The expected result is Negative.  Fact Sheet for Patients:  EntrepreneurPulse.com.au  Fact Sheet for Healthcare Providers:  IncredibleEmployment.be  This test is no                          t yet approved or cleared by the [place] FDA and  has been authorized for detection and/or diagnosis of SARS-CoV-2 by FDA under an Emergency Use Authorization (EUA). This EUA will remain  in effect (meaning this test can be used) for the duration of the COVID-19 declaration under Section 564(b)(1) of the Act, 21 U.S.C.section 360bbb-3(b)(1), unless the authorization is terminated  or revoked sooner.       Influenza A by PCR 06/30/2022 NEGATIVE  NEGATIVE Final   Influenza B by PCR 06/30/2022 NEGATIVE  NEGATIVE Final   Comment: (NOTE) The Xpert Xpress SARS-CoV-2/FLU/RSV plus assay is intended as an aid in the diagnosis of influenza from Nasopharyngeal swab specimens and should not be used as a sole basis for treatment. Nasal washings and aspirates are unacceptable for Xpert Xpress SARS-CoV-2/FLU/RSV testing.  Fact Sheet for Patients: EntrepreneurPulse.com.au  Fact Sheet for Healthcare  Providers: IncredibleEmployment.be  This test is not yet approved or cleared by the [place] FDA and has been authorized for detection and/or diagnosis of SARS-CoV-2 by FDA under an Emergency Use Authorization (EUA). This EUA will remain in effect (meaning this test can be used) for the duration of the COVID-19 declaration under Section 564(b)(1) of the Act, 21 U.S.C. section 360bbb-3(b)(1), unless the authorization is terminated or revoked.     Resp Syncytial Virus by PCR 06/30/2022 NEGATIVE  NEGATIVE Final   Comment: (NOTE) Fact Sheet for Patients: EntrepreneurPulse.com.au  Fact Sheet for Healthcare Providers: IncredibleEmployment.be  This test is not yet approved or cleared by the [place] FDA and has been authorized for detection and/or diagnosis of SARS-CoV-2 by FDA under an Emergency Use Authorization (EUA). This EUA will remain in effect (meaning this test can be used) for the duration of the COVID-19 declaration under Section 564(b)(1) of the Act, 21 U.S.C. section 360bbb-3(b)(1), unless the authorization is terminated or revoked.  Performed at [facility], [place] [address], [place], [place] [address]   Admission on 06/19/2022, Discharged on 06/20/2022  Component Date Value Ref Range Status   Sodium 06/19/2022 134 (L)  135 - 145 mmol/L Final   Potassium 06/19/2022 3.4 (L)  3.5 - 5.1 mmol/L Final   Chloride 06/19/2022 106  98 - 111 mmol/L Final   CO2 06/19/2022 23  22 - 32 mmol/L Final   Glucose, Bld 06/19/2022 234 (H)  70 - 99 mg/dL Final   Glucose reference range applies only to samples taken after fasting for at least 8 hours.   BUN 06/19/2022 9  6 - 20 mg/dL Final   Creatinine, Ser 06/19/2022 1.02 (H)  0.44 - 1.00 mg/dL Final   Calcium 06/19/2022 8.8 (L)  8.9 - 10.3 mg/dL Final   Total Protein 06/19/2022 8.0  6.5 - 8.1 g/dL Final   Albumin 06/19/2022 4.1  3.5 - 5.0 g/dL Final   AST  06/19/2022 31  15 - 41 U/L Final   ALT 06/19/2022 22  0 - 44 U/L Final   Alkaline Phosphatase 06/19/2022 62  38 - 126 U/L Final   Total Bilirubin 06/19/2022 0.4  0.3 - 1.2 mg/dL Final   GFR, Estimated 06/19/2022 >60  >60 mL/min Final   Comment: (NOTE) Calculated using the CKD-EPI Creatinine Equation (2021)    Anion gap 06/19/2022 5  5 - 15 Final   Performed at [facility], [place] [address], [place], [place] [address]   Alcohol, Ethyl (B) 06/19/2022 <10  <10 mg/dL Final   Comment: (NOTE) Lowest detectable limit for serum alcohol is 10 mg/dL.  For medical purposes only. Performed at [facility], [place] [address] [place], [place] [address]    Opiates 06/19/2022 NONE DETECTED  NONE DETECTED Final   Cocaine 06/19/2022 NONE DETECTED  NONE DETECTED Final   Benzodiazepines 06/19/2022 NONE DETECTED  NONE DETECTED Final   Amphetamines 06/19/2022 NONE DETECTED  NONE DETECTED Final   Tetrahydrocannabinol 06/19/2022 NONE DETECTED  NONE DETECTED Final   Barbiturates 06/19/2022 NONE DETECTED  NONE DETECTED Final   Comment: (NOTE) DRUG SCREEN FOR MEDICAL PURPOSES ONLY.  IF CONFIRMATION IS NEEDED FOR ANY PURPOSE, NOTIFY LAB WITHIN 5 DAYS.  LOWEST DETECTABLE LIMITS FOR URINE DRUG SCREEN Drug Class                     Cutoff (ng/mL) Amphetamine and metabolites    1000 Barbiturate and metabolites    200 Benzodiazepine                 200 Opiates and metabolites        300 Cocaine and metabolites        300 THC                            50 Performed at [facility], [place] [address], [place], [place] [address]    WBC 06/19/2022 12.2 (H)  4.0 - 10.5 K/uL Final   RBC 06/19/2022 4.39  3.87 - 5.11 MIL/uL Final   Hemoglobin 06/19/2022 10.6 (L)  12.0 - 15.0 g/dL Final   HCT 06/19/2022 34.5 (L)  36.0 - 46.0 % Final   MCV 06/19/2022 78.6 (L)  80.0 - 100.0 fL Final   MCH 06/19/2022 24.1 (L)  26.0 - 34.0 pg Final   MCHC 06/19/2022 30.7  30.0 -  36.0 g/dL Final   RDW 06/19/2022 16.2 (H)  11.5 - 15.5 % Final   Platelets 06/19/2022 247  150 - 400 K/uL Final   REPEATED TO VERIFY   nRBC 06/19/2022 0.0  0.0 - 0.2 % Final  Neutrophils Relative % 06/19/2022 56  % Final   Neutro Abs 06/19/2022 6.9  1.7 - 7.7 K/uL Final   Lymphocytes Relative 06/19/2022 35  % Final   Lymphs Abs 06/19/2022 4.2 (H)  0.7 - 4.0 K/uL Final   Monocytes Relative 06/19/2022 6  % Final   Monocytes Absolute 06/19/2022 0.8  0.1 - 1.0 K/uL Final   Eosinophils Relative 06/19/2022 2  % Final   Eosinophils Absolute 06/19/2022 0.3  0.0 - 0.5 K/uL Final   Basophils Relative 06/19/2022 1  % Final   Basophils Absolute 06/19/2022 0.1  0.0 - 0.1 K/uL Final   Immature Granulocytes 06/19/2022 0  % Final   Abs Immature Granulocytes 06/19/2022 0.03  0.00 - 0.07 K/uL Final   Performed at [facility], [place] [address], [place], [place] [id number]   Salicylate Lvl 123456 <7.0 (L)  7.0 - 30.0 mg/dL Final   Performed at [place] [address], [place], [place] [address]   Acetaminophen (Tylenol), Serum 06/19/2022 <10 (L)  10 - 30 ug/mL Final   Comment: (NOTE) Therapeutic concentrations vary significantly. A range of 10-30 ug/mL  may be an effective concentration for many patients. However, some  are best treated at concentrations outside of this range. Acetaminophen concentrations >150 ug/mL at 4 hours after ingestion  and >50 ug/mL at 12 hours after ingestion are often associated with  toxic reactions.  Performed at [facility], [place] [address], [place], [place] [address]    Lithium Lvl 06/20/2022 0.36 (L)  0.60 - 1.20 mmol/L Final   Performed at [place] [address], [place], [place] [address]   SARS Coronavirus 2 by RT PCR 06/20/2022 NEGATIVE  NEGATIVE Final   Comment: (NOTE) SARS-CoV-2 target nucleic acids are NOT DETECTED.  The SARS-CoV-2 RNA is generally detectable in upper  respiratory specimens during the acute phase of infection. The lowest concentration of SARS-CoV-2 viral copies this assay can detect is 138 copies/mL. A negative result does not preclude SARS-Cov-2 infection and should not be used as the sole basis for treatment or other patient management decisions. A negative result may occur with  improper specimen collection/handling, submission of specimen other than nasopharyngeal swab, presence of viral mutation(s) within the areas targeted by this assay, and inadequate number of viral copies(<138 copies/mL). A negative result must be combined with clinical observations, patient history, and epidemiological information. The expected result is Negative.  Fact Sheet for Patients:  EntrepreneurPulse.com.au  Fact Sheet for Healthcare Providers:  IncredibleEmployment.be  This test is no                          t yet approved or cleared by the [place] FDA and  has been authorized for detection and/or diagnosis of SARS-CoV-2 by FDA under an Emergency Use Authorization (EUA). This EUA will remain  in effect (meaning this test can be used) for the duration of the COVID-19 declaration under Section 564(b)(1) of the Act, 21 U.S.C.section 360bbb-3(b)(1), unless the authorization is terminated  or revoked sooner.       Influenza A by PCR 06/20/2022 NEGATIVE  NEGATIVE Final   Influenza B by PCR 06/20/2022 NEGATIVE  NEGATIVE Final   Comment: (NOTE) The Xpert Xpress SARS-CoV-2/FLU/RSV plus assay is intended as an aid in the diagnosis of influenza from Nasopharyngeal swab specimens and should not be used as a sole basis for treatment. Nasal washings and aspirates are unacceptable for  Xpert Xpress SARS-CoV-2/FLU/RSV testing.  Fact Sheet for Patients: EntrepreneurPulse.com.au  Fact Sheet for Healthcare Providers: IncredibleEmployment.be  This test is not yet approved or  cleared by the [place] FDA and has been authorized for detection and/or diagnosis of SARS-CoV-2 by FDA under an Emergency Use Authorization (EUA). This EUA will remain in effect (meaning this test can be used) for the duration of the COVID-19 declaration under Section 564(b)(1) of the Act, 21 U.S.C. section 360bbb-3(b)(1), unless the authorization is terminated or revoked.     Resp Syncytial Virus by PCR 06/20/2022 NEGATIVE  NEGATIVE Final   Comment: (NOTE) Fact Sheet for Patients: EntrepreneurPulse.com.au  Fact Sheet for Healthcare Providers: IncredibleEmployment.be  This test is not yet approved or cleared by the [place] FDA and has been authorized for detection and/or diagnosis of SARS-CoV-2 by FDA under an Emergency Use Authorization (EUA). This EUA will remain in effect (meaning this test can be used) for the duration of the COVID-19 declaration under Section 564(b)(1) of the Act, 21 U.S.C. section 360bbb-3(b)(1), unless the authorization is terminated or revoked.  Performed at [facility], [place] [address], [place], [place] [address]   Admission on 06/15/2022, Discharged on 06/15/2022  Component Date Value Ref Range Status   Preg Test, Ur 06/15/2022 NEGATIVE  NEGATIVE Final   Comment:        THE SENSITIVITY OF THIS METHODOLOGY IS >24 mIU/mL    SARS Coronavirus 2 06/15/2022 NEGATIVE  NEGATIVE Final   Comment: (NOTE) SARS-CoV-2 target nucleic acids are NOT DETECTED.  The SARS-CoV-2 RNA is generally detectable in upper and lower respiratory specimens during the acute phase of infection. Negative results do not preclude SARS-CoV-2 infection, do not rule out co-infections with other pathogens, and should not be used as the sole basis for treatment or other patient management decisions. Negative results must be combined with clinical observations, patient history, and epidemiological information. The  expected result is Negative.  Fact Sheet for Patients: SugarRoll.be  Fact Sheet for Healthcare Providers: https://www.woods-mathews.com/  This test is not yet approved or cleared by the [place] FDA and  has been authorized for detection and/or diagnosis of SARS-CoV-2 by FDA under an Emergency Use Authorization (EUA). This EUA will remain  in effect (meaning this test can be used) for the duration of the COVID-19 declaration under Se                          ction 564(b)(1) of the Act, 21 U.S.C. section 360bbb-3(b)(1), unless the authorization is terminated or revoked sooner.  Performed at [facility], [place] [address], [place], [place] [address]   Admission on 06/11/2022, Discharged on 06/11/2022  Component Date Value Ref Range Status   Lipase 06/11/2022 34  11 - 51 U/L Final   Performed at [facility], [place] [address] [place], [place] [address]   Sodium 06/11/2022 140  135 - 145 mmol/L Final   Potassium 06/11/2022 3.6  3.5 - 5.1 mmol/L Final   HEMOLYSIS AT THIS LEVEL MAY AFFECT RESULT   Chloride 06/11/2022 103  98 - 111 mmol/L Final   CO2 06/11/2022 26  22 - 32 mmol/L Final   Glucose, Bld 06/11/2022 127 (H)  70 - 99 mg/dL Final   Glucose reference range applies only to samples taken after fasting for at least 8 hours.   BUN 06/11/2022 7  6 - 20 mg/dL Final   Creatinine, Ser 06/11/2022 0.88  0.44 -  1.00 mg/dL Final   Calcium 06/11/2022 9.8  8.9 - 10.3 mg/dL Final   Total Protein 06/11/2022 7.4  6.5 - 8.1 g/dL Final   Albumin 06/11/2022 4.1  3.5 - 5.0 g/dL Final   AST 06/11/2022 34  15 - 41 U/L Final   HEMOLYSIS AT THIS LEVEL MAY AFFECT RESULT   ALT 06/11/2022 27  0 - 44 U/L Final   HEMOLYSIS AT THIS LEVEL MAY AFFECT RESULT   Alkaline Phosphatase 06/11/2022 68  38 - 126 U/L Final   Total Bilirubin 06/11/2022 0.7  0.3 - 1.2 mg/dL Final   HEMOLYSIS AT THIS LEVEL MAY AFFECT RESULT   GFR, Estimated 06/11/2022 >60  >60  mL/min Final   Comment: (NOTE) Calculated using the CKD-EPI Creatinine Equation (2021)    Anion gap 06/11/2022 11  5 - 15 Final   Performed at [facility], [place] [address], [place], [place] [address]   WBC 06/11/2022 11.6 (H)  4.0 - 10.5 K/uL Final   RBC 06/11/2022 4.66  3.87 - 5.11 MIL/uL Final   Hemoglobin 06/11/2022 10.9 (L)  12.0 - 15.0 g/dL Final   HCT 06/11/2022 35.9 (L)  36.0 - 46.0 % Final   MCV 06/11/2022 77.0 (L)  80.0 - 100.0 fL Final   MCH 06/11/2022 23.4 (L)  26.0 - 34.0 pg Final   MCHC 06/11/2022 30.4  30.0 - 36.0 g/dL Final   RDW 06/11/2022 16.0 (H)  11.5 - 15.5 % Final   Platelets 06/11/2022 283  150 - 400 K/uL Final   REPEATED TO VERIFY   nRBC 06/11/2022 0.0  0.0 - 0.2 % Final   Performed at [facility], [place] [address] [place], [place] [address]   Color, Urine 06/11/2022 YELLOW  YELLOW Final   APPearance 06/11/2022 HAZY (A)  CLEAR Final   Specific Gravity, Urine 06/11/2022 1.024  1.005 - 1.030 Final   pH 06/11/2022 5.0  5.0 - 8.0 Final   Glucose, UA 06/11/2022 NEGATIVE  NEGATIVE mg/dL Final   Hgb urine dipstick 06/11/2022 NEGATIVE  NEGATIVE Final   Bilirubin Urine 06/11/2022 NEGATIVE  NEGATIVE Final   Ketones, ur 06/11/2022 5 (A)  NEGATIVE mg/dL Final   Protein, ur 06/11/2022 >=300 (A)  NEGATIVE mg/dL Final   Nitrite 06/11/2022 NEGATIVE  NEGATIVE Final   Leukocytes,Ua 06/11/2022 TRACE (A)  NEGATIVE Final   RBC / HPF 06/11/2022 0-5  0 - 5 RBC/hpf Final   WBC, UA 06/11/2022 21-50  0 - 5 WBC/hpf Final   Bacteria, UA 06/11/2022 RARE (A)  NONE SEEN Final   Squamous Epithelial / HPF 06/11/2022 11-20  0 - 5 /HPF Final   Mucus 06/11/2022 PRESENT   Final   Ca Oxalate Crys, UA 06/11/2022 PRESENT   Final   Performed at [facility], [place] [address], [place], [place] [address]   SARS Coronavirus 2 by RT PCR 06/11/2022 NEGATIVE  NEGATIVE Final   Influenza A by PCR 06/11/2022 NEGATIVE  NEGATIVE Final   Influenza B by PCR 06/11/2022 NEGATIVE  NEGATIVE Final    Comment: (NOTE) The Xpert Xpress SARS-CoV-2/FLU/RSV plus assay is intended as an aid in the diagnosis of influenza from Nasopharyngeal swab specimens and should not be used as a sole basis for treatment. Nasal washings and aspirates are unacceptable for Xpert Xpress SARS-CoV-2/FLU/RSV testing.  Fact Sheet for Patients: EntrepreneurPulse.com.au  Fact Sheet for Healthcare Providers: IncredibleEmployment.be  This test is not yet approved or cleared by the [place] and has been authorized  for detection and/or diagnosis of SARS-CoV-2 by FDA under an Emergency Use Authorization (EUA). This EUA will remain in effect (meaning this test can be used) for the duration of the COVID-19 declaration under Section 564(b)(1) of the Act, 21 U.S.C. section 360bbb-3(b)(1), unless the authorization is terminated or revoked.     Resp Syncytial Virus by PCR 06/11/2022 NEGATIVE  NEGATIVE Final   Comment: (NOTE) Fact Sheet for Patients: EntrepreneurPulse.com.au  Fact Sheet for Healthcare Providers: IncredibleEmployment.be  This test is not yet approved or cleared by the [place] FDA and has been authorized for detection and/or diagnosis of SARS-CoV-2 by FDA under an Emergency Use Authorization (EUA). This EUA will remain in effect (meaning this test can be used) for the duration of the COVID-19 declaration under Section 564(b)(1) of the Act, 21 U.S.C. section 360bbb-3(b)(1), unless the authorization is terminated or revoked.  Performed at [facility], [place] [address], [place], [place] [address]    Preg Test, Ur 06/11/2022 NEGATIVE  NEGATIVE Final   Comment:        THE SENSITIVITY OF THIS METHODOLOGY IS >20 mIU/mL. Performed at [facility], [place] [address], [place], [place] [address]   Admission on 06/09/2022, Discharged on 06/09/2022  Component Date Value Ref Range Status   Sodium 06/09/2022 137   135 - 145 mmol/L Final   Potassium 06/09/2022 3.3 (L)  3.5 - 5.1 mmol/L Final   Chloride 06/09/2022 104  98 - 111 mmol/L Final   CO2 06/09/2022 23  22 - 32 mmol/L Final   Glucose, Bld 06/09/2022 179 (H)  70 - 99 mg/dL Final   Glucose reference range applies only to samples taken after fasting for at least 8 hours.   BUN 06/09/2022 8  6 - 20 mg/dL Final   Creatinine, Ser 06/09/2022 0.76  0.44 - 1.00 mg/dL Final   Calcium 06/09/2022 8.7 (L)  8.9 - 10.3 mg/dL Final   GFR, Estimated 06/09/2022 >60  >60 mL/min Final   Comment: (NOTE) Calculated using the CKD-EPI Creatinine Equation (2021)    Anion gap 06/09/2022 10  5 - 15 Final   Performed at [facility], [place] [address], [place], [place] [address]   WBC 06/09/2022 11.9 (H)  4.0 - 10.5 K/uL Final   RBC 06/09/2022 4.25  3.87 - 5.11 MIL/uL Final   Hemoglobin 06/09/2022 10.2 (L)  12.0 - 15.0 g/dL Final   HCT 06/09/2022 33.2 (L)  36.0 - 46.0 % Final   MCV 06/09/2022 78.1 (L)  80.0 - 100.0 fL Final   MCH 06/09/2022 24.0 (L)  26.0 - 34.0 pg Final   MCHC 06/09/2022 30.7  30.0 - 36.0 g/dL Final   RDW 06/09/2022 15.9 (H)  11.5 - 15.5 % Final   Platelets 06/09/2022 261  150 - 400 K/uL Final   nRBC 06/09/2022 0.0  0.0 - 0.2 % Final   Neutrophils Relative % 06/09/2022 49  % Final   Neutro Abs 06/09/2022 5.9  1.7 - 7.7 K/uL Final   Lymphocytes Relative 06/09/2022 41  % Final   Lymphs Abs 06/09/2022 4.8 (H)  0.7 - 4.0 K/uL Final   Monocytes Relative 06/09/2022 6  % Final   Monocytes Absolute 06/09/2022 0.7  0.1 - 1.0 K/uL Final   Eosinophils Relative 06/09/2022 3  % Final   Eosinophils Absolute 06/09/2022 0.3  0.0 - 0.5 K/uL Final   Basophils Relative 06/09/2022 1  % Final   Basophils Absolute 06/09/2022 0.1  0.0 - 0.1 K/uL  Final   Immature Granulocytes 06/09/2022 0  % Final   Abs Immature Granulocytes 06/09/2022 0.04  0.00 - 0.07 K/uL Final   Performed at [facility], [place] [address] [place], [place] [address]  Admission on  06/07/2022, Discharged on 06/07/2022  Component Date Value Ref Range Status   Yeast Wet Prep HPF POC 06/07/2022 NONE SEEN  NONE SEEN Final   Trich, Wet Prep 06/07/2022 NONE SEEN  NONE SEEN Final   Clue Cells Wet Prep HPF POC 06/07/2022 NONE SEEN  NONE SEEN Final   WBC, Wet Prep HPF POC 06/07/2022 <10  <10 Final   Sperm 06/07/2022 NONE SEEN   Final   Performed at [facility], [place] [address] [place], [place] [address]   Neisseria Gonorrhea 06/07/2022 Positive (A)   Final   Chlamydia 06/07/2022 Negative   Final   Comment 06/07/2022 Normal Reference [place] Chlamydia - Negative   Final   Comment 06/07/2022 Normal Reference Range Neisseria Gonorrhea - Negative   Final  Admission on 04/29/2022, Discharged on 04/30/2022  Component Date Value Ref Range Status   SARS Coronavirus 2 by RT PCR 04/29/2022 NEGATIVE  NEGATIVE Final   Comment: (NOTE) SARS-CoV-2 target nucleic acids are NOT DETECTED.  The SARS-CoV-2 RNA is generally detectable in upper respiratory specimens during the acute phase of infection. The lowest concentration of SARS-CoV-2 viral copies this assay can detect is 138 copies/mL. A negative result does not preclude SARS-Cov-2 infection and should not be used as the sole basis for treatment or other patient management decisions. A negative result may occur with  improper specimen collection/handling, submission of specimen other than nasopharyngeal swab, presence of viral mutation(s) within the areas targeted by this assay, and inadequate number of viral copies(<138 copies/mL). A negative result must be combined with clinical observations, patient history, and epidemiological information. The expected result is Negative.  Fact Sheet for Patients:  EntrepreneurPulse.com.au  Fact Sheet for Healthcare Providers:  IncredibleEmployment.be  This test is no                          t yet approved or cleared by the [place]  FDA and  has been authorized for detection and/or diagnosis of SARS-CoV-2 by FDA under an Emergency Use Authorization (EUA). This EUA will remain  in effect (meaning this test can be used) for the duration of the COVID-19 declaration under Section 564(b)(1) of the Act, 21 U.S.C.section 360bbb-3(b)(1), unless the authorization is terminated  or revoked sooner.       Influenza A by PCR 04/29/2022 NEGATIVE  NEGATIVE Final   Influenza B by PCR 04/29/2022 NEGATIVE  NEGATIVE Final   Comment: (NOTE) The Xpert Xpress SARS-CoV-2/FLU/RSV plus assay is intended as an aid in the diagnosis of influenza from Nasopharyngeal swab specimens and should not be used as a sole basis for treatment. Nasal washings and aspirates are unacceptable for Xpert Xpress SARS-CoV-2/FLU/RSV testing.  Fact Sheet for Patients: EntrepreneurPulse.com.au  Fact Sheet for Healthcare Providers: IncredibleEmployment.be  This test is not yet approved or cleared by the [place] FDA and has been authorized for detection and/or diagnosis of SARS-CoV-2 by FDA under an Emergency Use Authorization (EUA). This EUA will remain in effect (meaning this test can be used) for the duration of the COVID-19 declaration under Section 564(b)(1) of the Act, 21 U.S.C. section 360bbb-3(b)(1), unless the authorization is terminated or revoked.     Resp Syncytial Virus by PCR 04/29/2022 NEGATIVE  NEGATIVE Final   Comment: (NOTE) Fact Sheet for Patients: EntrepreneurPulse.com.au  Fact Sheet for Healthcare Providers: IncredibleEmployment.be  This test is not yet approved or cleared by the [place] FDA and has been authorized for detection and/or diagnosis of SARS-CoV-2 by FDA under an Emergency Use Authorization (EUA). This EUA will remain in effect (meaning this test can be used) for the duration of the COVID-19 declaration under Section 564(b)(1) of the  Act, 21 U.S.C. section 360bbb-3(b)(1), unless the authorization is terminated or revoked.  Performed at [facility], [place] [address], [place], [place] [address]    Preg Test, Ur 04/29/2022 Negative  Negative Preliminary   POC Amphetamine UR 04/29/2022 None Detected  NONE DETECTED (Cut Off Level 1000 ng/mL) Preliminary   POC Secobarbital (BAR) 04/29/2022 None Detected  NONE DETECTED (Cut Off Level 300 ng/mL) Preliminary   POC Buprenorphine (BUP) 04/29/2022 None Detected  NONE DETECTED (Cut Off Level 10 ng/mL) Preliminary   POC Oxazepam (BZO) 04/29/2022 None Detected  NONE DETECTED (Cut Off Level 300 ng/mL) Preliminary   POC Cocaine UR 04/29/2022 None Detected  NONE DETECTED (Cut Off Level 300 ng/mL) Preliminary   POC Methamphetamine UR 04/29/2022 None Detected  NONE DETECTED (Cut Off Level 1000 ng/mL) Preliminary   POC Morphine 04/29/2022 None Detected  NONE DETECTED (Cut Off Level 300 ng/mL) Preliminary   POC Methadone UR 04/29/2022 None Detected  NONE DETECTED (Cut Off Level 300 ng/mL) Preliminary   POC Oxycodone UR 04/29/2022 None Detected  NONE DETECTED (Cut Off Level 100 ng/mL) Preliminary   POC Marijuana UR 04/29/2022 None Detected  NONE DETECTED (Cut Off Level 50 ng/mL) Preliminary   SARSCOV2ONAVIRUS 2 AG 04/29/2022 NEGATIVE  NEGATIVE Final   Comment: (NOTE) SARS-CoV-2 antigen NOT DETECTED.   Negative results are presumptive.  Negative results do not preclude SARS-CoV-2 infection and should not be used as the sole basis for treatment or other patient management decisions, including infection  control decisions, particularly in the presence of clinical signs and  symptoms consistent with COVID-19, or in those who have been in contact with the virus.  Negative results must be combined with clinical observations, patient history, and epidemiological information. The expected result is Negative.  Fact Sheet for Patients: HandmadeRecipes.com.cy  Fact Sheet  for Healthcare Providers: FuneralLife.at  This test is not yet approved or cleared by the [place] FDA and  has been authorized for detection and/or diagnosis of SARS-CoV-2 by FDA under an Emergency Use Authorization (EUA).  This EUA will remain in effect (meaning this test can be used) for the duration of  the COV                          ID-19 declaration under Section 564(b)(1) of the Act, 21 U.S.C. section 360bbb-3(b)(1), unless the authorization is terminated or revoked sooner.     Preg Test, Ur 04/29/2022 NEGATIVE  NEGATIVE Final   Comment:        THE SENSITIVITY OF THIS METHODOLOGY IS >24 mIU/mL    Glucose-Capillary 04/30/2022 268 (H)  70 - 99 mg/dL Final   Glucose reference range applies only to samples taken after fasting for at least 8 hours.   Glucose-Capillary 04/30/2022 241 (H)  70 - 99 mg/dL Final   Glucose reference range applies only to samples taken after fasting for at least 8 hours.  Admission on 04/23/2022, Discharged on 04/24/2022  Component Date Value Ref Range Status   Sodium 04/23/2022 139  135 -  145 mmol/L Final   Potassium 04/23/2022 3.6  3.5 - 5.1 mmol/L Final   Chloride 04/23/2022 108  98 - 111 mmol/L Final   CO2 04/23/2022 24  22 - 32 mmol/L Final   Glucose, Bld 04/23/2022 195 (H)  70 - 99 mg/dL Final   Glucose reference range applies only to samples taken after fasting for at least 8 hours.   BUN 04/23/2022 14  6 - 20 mg/dL Final   Creatinine, Ser 04/23/2022 0.85  0.44 - 1.00 mg/dL Final   Calcium 04/23/2022 9.4  8.9 - 10.3 mg/dL Final   Total Protein 04/23/2022 7.9  6.5 - 8.1 g/dL Final   Albumin 04/23/2022 3.9  3.5 - 5.0 g/dL Final   AST 04/23/2022 25  15 - 41 U/L Final   ALT 04/23/2022 21  0 - 44 U/L Final   Alkaline Phosphatase 04/23/2022 62  38 - 126 U/L Final   Total Bilirubin 04/23/2022 0.4  0.3 - 1.2 mg/dL Final   GFR, Estimated 04/23/2022 >60  >60 mL/min Final   Comment: (NOTE) Calculated using the CKD-EPI  Creatinine Equation (2021)    Anion gap 04/23/2022 7  5 - 15 Final   Performed at [facility], [place] [address] [place], [place] [address]   Alcohol, Ethyl (B) 04/23/2022 <10  <10 mg/dL Final   Comment: (NOTE) Lowest detectable limit for serum alcohol is 10 mg/dL.  For medical purposes only. Performed at [facility], [place] [address] [place], [place] [address]    WBC 04/23/2022 13.0 (H)  4.0 - 10.5 K/uL Final   RBC 04/23/2022 4.52  3.87 - 5.11 MIL/uL Final   Hemoglobin 04/23/2022 10.9 (L)  12.0 - 15.0 g/dL Final   HCT 04/23/2022 34.9 (L)  36.0 - 46.0 % Final   MCV 04/23/2022 77.2 (L)  80.0 - 100.0 fL Final   MCH 04/23/2022 24.1 (L)  26.0 - 34.0 pg Final   MCHC 04/23/2022 31.2  30.0 - 36.0 g/dL Final   RDW 04/23/2022 15.4  11.5 - 15.5 % Final   Platelets 04/23/2022 273  150 - 400 K/uL Final   nRBC 04/23/2022 0.0  0.0 - 0.2 % Final   Performed at [facility], [place] [address], [place], [place] [address]   Opiates 04/23/2022 NONE DETECTED  NONE DETECTED Final   Cocaine 04/23/2022 NONE DETECTED  NONE DETECTED Final   Benzodiazepines 04/23/2022 NONE DETECTED  NONE DETECTED Final   Amphetamines 04/23/2022 NONE DETECTED  NONE DETECTED Final   Tetrahydrocannabinol 04/23/2022 NONE DETECTED  NONE DETECTED Final   Barbiturates 04/23/2022 NONE DETECTED  NONE DETECTED Final   Comment: (NOTE) DRUG SCREEN FOR MEDICAL PURPOSES ONLY.  IF CONFIRMATION IS NEEDED FOR ANY PURPOSE, NOTIFY LAB WITHIN 5 DAYS.  LOWEST DETECTABLE LIMITS FOR URINE DRUG SCREEN Drug Class                     Cutoff (ng/mL) Amphetamine and metabolites    1000 Barbiturate and metabolites    200 Benzodiazepine                 200 Opiates and metabolites        300 Cocaine and metabolites        300 THC                            50 Performed at [facility], [place] [address] [place],  [place] [address]    hCG, Beta Chain, Quant, S 04/23/2022 <1   <5 mIU/mL Final   Comment:          GEST. AGE      CONC.  (mIU/mL)   <=1 WEEK        5 - 50     2 WEEKS       50 - 500     3 WEEKS       100 - 10,000     4 WEEKS     1,000 - 30,000     5 WEEKS     3,500 - 115,000   6-8 WEEKS     12,000 - 270,000    12 WEEKS     15,000 - 220,000        FEMALE AND NON-PREGNANT FEMALE:     LESS THAN 5 mIU/mL Performed at [facility], [place] [address], [place], [place] [address]    Lithium Lvl 04/23/2022 0.15 (L)  0.60 - 1.20 mmol/L Final   Performed at [place] [address], [place], [place] [address]   Magnesium 04/23/2022 1.9  1.7 - 2.4 mg/dL Final   Performed at [facility] [address] [place], [place] [id number]   Salicylate Lvl [id number] <7.0 (L)  7.0 - 30.0 mg/dL Final   Performed at [facility] [address] [place], [place] [address]   Acetaminophen (Tylenol), Serum 04/23/2022 <10 (L)  10 - 30 ug/mL Final   Comment: (NOTE) Therapeutic concentrations vary significantly. A range of 10-30 ug/mL  may be an effective concentration for many patients. However, some  are best treated at concentrations outside of this range. Acetaminophen concentrations >150 ug/mL at 4 hours after ingestion  and >50 ug/mL at 12 hours after ingestion are often associated with  toxic reactions.  Performed at [facility], [place] [address], [place], [place] [address]    Acetaminophen (Tylenol), Serum 04/24/2022 <10 (L)  10 - 30 ug/mL Final   Comment: (NOTE) Therapeutic concentrations vary significantly. A range of 10-30 ug/mL  may be an effective concentration for many patients. However, some  are best treated at concentrations outside of this range. Acetaminophen concentrations >150 ug/mL at 4 hours after ingestion  and >50 ug/mL at 12 hours after ingestion are often associated with  toxic reactions.  Performed at [facility], [place]  [address], [place], [place] [address]   Admission on 04/16/2022, Discharged on 04/16/2022  Component Date Value Ref Range Status   SARS Coronavirus 2 by RT PCR 04/15/2022 NEGATIVE  NEGATIVE Final   Comment: (NOTE) SARS-CoV-2 target nucleic acids are NOT DETECTED.  The SARS-CoV-2 RNA is generally detectable in upper respiratory specimens during the acute phase of infection. The lowest concentration of SARS-CoV-2 viral copies this assay can detect is 138 copies/mL. A negative result does not preclude SARS-Cov-2 infection and should not be used as the sole basis for treatment or other patient management decisions. A negative result may occur with  improper specimen collection/handling, submission of specimen other than nasopharyngeal swab, presence of viral mutation(s) within the areas targeted by this assay, and inadequate number of viral copies(<138 copies/mL). A negative result must be combined with clinical observations, patient history, and epidemiological information. The expected result is Negative.  Fact Sheet for Patients:  EntrepreneurPulse.com.au  Fact Sheet for Healthcare Providers:  IncredibleEmployment.be  This test is no  t yet approved or cleared by the [place] and  has been authorized for detection and/or diagnosis of SARS-CoV-2 by FDA under an Emergency Use Authorization (EUA). This EUA will remain  in effect (meaning this test can be used) for the duration of the COVID-19 declaration under Section 564(b)(1) of the Act, 21 U.S.C.section 360bbb-3(b)(1), unless the authorization is terminated  or revoked sooner.       Influenza A by PCR 04/15/2022 NEGATIVE  NEGATIVE Final   Influenza B by PCR 04/15/2022 NEGATIVE  NEGATIVE Final   Comment: (NOTE) The Xpert Xpress SARS-CoV-2/FLU/RSV plus assay is intended as an aid in the diagnosis of influenza from Nasopharyngeal swab specimens and should not be  used as a sole basis for treatment. Nasal washings and aspirates are unacceptable for Xpert Xpress SARS-CoV-2/FLU/RSV testing.  Fact Sheet for Patients: EntrepreneurPulse.com.au  Fact Sheet for Healthcare Providers: IncredibleEmployment.be  This test is not yet approved or cleared by the [place] FDA and has been authorized for detection and/or diagnosis of SARS-CoV-2 by FDA under an Emergency Use Authorization (EUA). This EUA will remain in effect (meaning this test can be used) for the duration of the COVID-19 declaration under Section 564(b)(1) of the Act, 21 U.S.C. section 360bbb-3(b)(1), unless the authorization is terminated or revoked.     Resp Syncytial Virus by PCR 04/15/2022 NEGATIVE  NEGATIVE Final   Comment: (NOTE) Fact Sheet for Patients: EntrepreneurPulse.com.au  Fact Sheet for Healthcare Providers: IncredibleEmployment.be  This test is not yet approved or cleared by the [place] FDA and has been authorized for detection and/or diagnosis of SARS-CoV-2 by FDA under an Emergency Use Authorization (EUA). This EUA will remain in effect (meaning this test can be used) for the duration of the COVID-19 declaration under Section 564(b)(1) of the Act, 21 U.S.C. section 360bbb-3(b)(1), unless the authorization is terminated or revoked.  Performed at [facility], [address], [place], [place] [address]   There may be more visits with results that are not included.    Allergies: Wellbutrin [bupropion], Omnipaque [iohexol], Penicillins, Atarax [hydroxyzine], Contrast media [iodinated contrast media], and Cymbalta [duloxetine hcl]  Medications:  Facility Ordered Medications  Medication   acetaminophen (TYLENOL) tablet 650 mg   alum & mag hydroxide-simeth (MAALOX/MYLANTA) 200-200-20 MG/5ML suspension 30 mL   magnesium hydroxide (MILK OF MAGNESIA) suspension 30 mL    OLANZapine zydis (ZYPREXA) disintegrating tablet 10 mg   And   LORazepam (ATIVAN) tablet 1 mg   And   ziprasidone (GEODON) injection 20 mg   amLODipine (NORVASC) tablet 10 mg   benztropine (COGENTIN) tablet 0.5 mg   cholecalciferol (VITAMIN D3) 25 MCG (1000 UNIT) tablet 1,000 Units   clomiPRAMINE (ANAFRANIL) capsule 25 mg   gabapentin (NEURONTIN) capsule 100 mg   haloperidol (HALDOL) tablet 10 mg   insulin aspart protamine- aspart (NOVOLOG MIX 70/30) injection 20 Units   lithium carbonate (ESKALITH) ER tablet 450 mg   metoprolol tartrate (LOPRESSOR) tablet 12.5 mg   pantoprazole (PROTONIX) EC tablet 40 mg   ondansetron (ZOFRAN-ODT) disintegrating tablet 4 mg   simvastatin (ZOCOR) tablet 40 mg   traZODone (DESYREL) tablet 50 mg   venlafaxine XR (EFFEXOR-XR) 24 hr capsule 150 mg   venlafaxine XR (EFFEXOR-XR) 24 hr capsule 75 mg   PTA Medications  Medication Sig   metoprolol tartrate (LOPRESSOR) 25 MG tablet Take 12.5 mg by mouth 2 (two) times daily.   Cholecalciferol (VITAMIN D3) 25 MCG (1000 UT) CAPS Take 1,000 Units by mouth daily.  simvastatin (ZOCOR) 40 MG tablet Take 40 mg by mouth at bedtime.   amLODipine (NORVASC) 10 MG tablet Take 10 mg by mouth daily.   omeprazole (PRILOSEC) 20 MG capsule Take 20 mg by mouth daily.   insulin aspart protamine- aspart (NOVOLOG MIX 70/30) (70-30) 100 UNIT/ML injection Inject 20 Units into the skin 2 (two) times daily with a meal.   haloperidol (HALDOL) 10 MG tablet Take 1 tablet (10 mg total) by mouth at bedtime. For mood stabilization   venlafaxine XR (EFFEXOR-XR) 75 MG 24 hr capsule Take 1 capsule (75 mg total) by mouth daily with breakfast. For depression   traZODone (DESYREL) 50 MG tablet Take 1 tablet (50 mg total) by mouth at bedtime as needed for sleep.   lithium carbonate (ESKALITH) 450 MG ER tablet Take 1 tablet (450 mg total) by mouth at bedtime. For mood stabilization   benztropine (COGENTIN) 0.5 MG tablet Take 1 tablet (0.5 mg total)  by mouth 2 (two) times daily. For eps   gabapentin (NEURONTIN) 100 MG capsule Take 1 capsule (100 mg total) by mouth 3 (three) times daily. For agitation   diclofenac Sodium (VOLTAREN) 1 % GEL Apply 2 g topically 4 (four) times daily.   ondansetron (ZOFRAN-ODT) 4 MG disintegrating tablet Take 1 tablet (4 mg total) by mouth every 8 (eight) hours as needed for nausea or vomiting.   venlafaxine XR (EFFEXOR-XR) 150 MG 24 hr capsule Take 150 mg by mouth daily with breakfast.   clomiPRAMINE (ANAFRANIL) 25 MG capsule Take 25 mg by mouth at bedtime. Take 1 capsule at bedtime for 3 days then 2 capsules for 3 days then 3 capsules at bedtime    Medical Decision Making  Inpatient observation   Lab Orders         Resp panel by RT-PCR (RSV, Flu A&B, Covid) Anterior Nasal Swab         POCT Urine Drug Screen - (I-Screen)         POC urine preg, ED         POC SARS Coronavirus 2 Ag         Pregnancy, urine POC      Meds ordered this encounter  Medications   acetaminophen (TYLENOL) tablet 650 mg   alum & mag hydroxide-simeth (MAALOX/MYLANTA) 200-200-20 MG/5ML suspension 30 mL   magnesium hydroxide (MILK OF MAGNESIA) suspension 30 mL   AND Linked Order Group    OLANZapine zydis (ZYPREXA) disintegrating tablet 10 mg    LORazepam (ATIVAN) tablet 1 mg    ziprasidone (GEODON) injection 20 mg   amLODipine (NORVASC) tablet 10 mg   benztropine (COGENTIN) tablet 0.5 mg   cholecalciferol (VITAMIN D3) 25 MCG (1000 UNIT) tablet 1,000 Units   clomiPRAMINE (ANAFRANIL) capsule 25 mg   gabapentin (NEURONTIN) capsule 100 mg   haloperidol (HALDOL) tablet 10 mg   insulin aspart protamine- aspart (NOVOLOG MIX 70/30) injection 20 Units   lithium carbonate (ESKALITH) ER tablet 450 mg   metoprolol tartrate (LOPRESSOR) tablet 12.5 mg   pantoprazole (PROTONIX) EC tablet 40 mg   ondansetron (ZOFRAN-ODT) disintegrating tablet 4 mg   simvastatin (ZOCOR) tablet 40 mg   traZODone (DESYREL) tablet 50 mg   venlafaxine XR  (EFFEXOR-XR) 24 hr capsule 150 mg   venlafaxine XR (EFFEXOR-XR) 24 hr capsule 75 mg     Recommendations  Based on my evaluation the patient appears to have an emergency medical condition for which I recommend the patient be transferred to the emergency department for further  evaluation.  [name], NP 07/03/22  5:21 AM

## 2022-07-03 NOTE — ED Notes (Signed)
Patient refused venalafaine ., will notify provider.

## 2022-07-03 NOTE — ED Notes (Signed)
Patient has been brought on the unit, familiarized with unit, nutrition offered, medication given, patient is now lying in bed quietly, no distress noted, will continue to monitor patient for safety.

## 2022-07-03 NOTE — ED Notes (Signed)
Pt is currently sleeping, no distress noted, environmental check complete, will continue to monitor patient for safety. ? ?
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
## 2022-07-03 NOTE — ED Provider Notes (Signed)
FBC/OBS ASAP Discharge Summary  Date and Time: 07/03/2022 8:44 AM  Name: [name]  MRN:  [id number]   Discharge Diagnoses:  Final diagnoses:  Suicidal ideation  Agitation  Anxious appearance    Subjective: Patient states "can I go home now?"  Patient states "I feel more chill and I am ready to go home."  She states "I do not want to go to inpatient again, I was just discharged from [facility] last week."  [name] is reassessed, face-to-face, by nurse practitioner.  She is reclined in observation area upon my approach.  She is alert and oriented, pleasant and cooperative during assessment.  She presents with euthymic mood, congruent affect.  [name] refused Effexor dose this morning. Will follow up with established outpatient psychiatrist,  Dr Jake Samples, regarding potentially updating medications.  Patient reports feeling passively suicidal prior to arrival on yesterday.  Unable to define triggers.  She also endorses auditory hallucinations on yesterday.  Denies currently.  She denies suicidal and homicidal ideations.  She easily contracts verbally for safety with this Probation officer.  She denies auditory and visual hallucinations.  There is no evidence of delusional thought content and no indication that patient is responding to internal stimuli.  She denies symptoms of paranoia.  [name]'s diagnoses include generalized anxiety disorder, major depressive disorder, schizoaffective disorder and schizophrenia.  She is followed by [facility], compliant with medications.  She meets with ACT team 2 times per week.  She is compliant with long-acting injectable medications as well as oral medications.  She endorses multiple previous inpatient psychiatric hospitalizations.  No family mental health history reported.  Patient resides alone in [place].  She receives disability income.  She denies access to weapons.  She endorses average sleep and appetite.  She denies alcohol and substance  use.  Patient offered support and encouragement.     Patient educated and verbalizes understanding of mental health resources and other crisis services in the community. They are instructed to call 911 and present to the nearest emergency room should patient experience any suicidal/homicidal ideation, auditory/visual/hallucinations, or detrimental worsening of mental health condition.    Celsey gives verbal consent to speak with [facility].  Spoke with [facility] team member [place] phone number [phone].  ACT team will follow-up with [name] at her home later today.  Reviewed with ACT team [name]'s concerns regarding venlafaxine.  Lygia reports "I do not like the Effexor I do like how it makes me feel."  Team will review medications with [name].   Stay Summary: HPI completed 07/03/2022 1249am: [name], 32 y/o female with a history of bipolar dx, schizoaffective disorder, acute psychosis suicidal ideation, malingering.  Presented to [facility] via [employer].  According to the patient she is suicidal to overdose on her medicine when asked why she is suicidal patient stated my boyfriend has been cheating on me with both men and woman and she is tired of it.  Patient does have an extensive ED visits, patient lives alone.  According to patient she does have an ACT team.  According to the patient she last saw her ACT team today but she does not think her medication is working and the ACT team tried to get in touch with a psychiatrist but no answer.  According to the patient she has lost some weight but she does not know how much weight patient stated she has been sleeping up all the time.   Face-to-face observation of patient, patient is alert and oriented  x 4, speech is clear however patient can become agitated really easily and her voice does increase with agitation.  Patient is easily redirected when spoken to.  Patient does not answer all questions appropriately.  Patient endorsed suicidal  ideation to overdose on her medications.  Patient denies HI, AVH.  Patient does seem to be paranoid because she is stated that people tell her that her boyfriend is cheating on her.  Patient denies alcohol use, reports she only smokes cigarettes.  According to patient she already took her night medication before coming here.  Patient does not seem to be influenced by internal or external stimuli at this time.   No blood work needed,  pt had lab done 06/30/22  Total Time spent with patient: 30 minutes  Past Psychiatric History: Bipolar 1 disorder, generalized anxiety disorder, major depressive disorder, suicidal ideation, impulse control disorder, schizoaffective disorder, schizophrenia, disorganized, adjustment disorder with emotional disturbance, affective psychosis, malingering Past Medical History: Hyperlipidemia, hypertension, GERD, migraine without aura Family History: None reported Family Psychiatric History: none reported Social History: Mental health act team Tobacco Cessation:  N/A, patient does not currently use tobacco products  Current Medications:  Current Facility-Administered Medications  Medication Dose Route Frequency Provider Last Rate Last Admin   acetaminophen (TYLENOL) tablet 650 mg  650 mg Oral Q6H PRN [name], NP       alum & mag hydroxide-simeth (MAALOX/MYLANTA) 200-200-20 MG/5ML suspension 30 mL  30 mL Oral Q4H PRN [name], NP       amLODipine (NORVASC) tablet 10 mg  10 mg Oral Daily [name], NP       benztropine (COGENTIN) tablet 0.5 mg  0.5 mg Oral BID [name], NP   0.5 mg at 07/03/22 [id number]   cholecalciferol (VITAMIN D3) 25 MCG (1000 UNIT) tablet 1,000 Units  1,000 Units Oral Daily [name], NP       clomiPRAMINE (ANAFRANIL) capsule 25 mg  25 mg Oral QHS [name], NP   25 mg at 07/03/22 0225   gabapentin (NEURONTIN) capsule 100 mg  100 mg Oral TID [name], NP       haloperidol (HALDOL) tablet 10 mg  10 mg Oral QHS [name],  NP   10 mg at 07/03/22 0225   insulin aspart protamine- aspart (NOVOLOG MIX 70/30) injection 20 Units  20 Units Subcutaneous BID WC [name], NP   20 Units at 07/03/22 0810   lithium carbonate (ESKALITH) ER tablet 450 mg  450 mg Oral QHS [name], NP   450 mg at 07/03/22 0224   OLANZapine zydis (ZYPREXA) disintegrating tablet 10 mg  10 mg Oral Q8H PRN [name], NP       And   LORazepam (ATIVAN) tablet 1 mg  1 mg Oral PRN [name], NP       And   ziprasidone (GEODON) injection 20 mg  20 mg Intramuscular PRN [name], NP       magnesium hydroxide (MILK OF MAGNESIA) suspension 30 mL  30 mL Oral Daily PRN [name], NP       metoprolol tartrate (LOPRESSOR) tablet 12.5 mg  12.5 mg Oral BID [name], NP   12.5 mg at 07/03/22 0225   ondansetron (ZOFRAN-ODT) disintegrating tablet 4 mg  4 mg Oral Q8H PRN [name], NP       pantoprazole (PROTONIX) EC tablet 40 mg  40 mg Oral Daily [name], NP       simvastatin (ZOCOR) tablet 40 mg  40 mg Oral QHS [name], NP   40 mg at 07/03/22 0225   traZODone (DESYREL) tablet 50 mg  50 mg Oral QHS PRN [name], NP       venlafaxine XR (EFFEXOR-XR) 24 hr capsule 150 mg  150 mg Oral Q breakfast [name], NP       venlafaxine XR (EFFEXOR-XR) 24 hr capsule 75 mg  75 mg Oral Q breakfast [name], NP       Current Outpatient Medications  Medication Sig Dispense Refill   amLODipine (NORVASC) 10 MG tablet Take 10 mg by mouth daily.     benztropine (COGENTIN) 0.5 MG tablet Take 1 tablet (0.5 mg total) by mouth 2 (two) times daily. For eps 60 tablet 0   Cholecalciferol (VITAMIN D3) 25 MCG (1000 UT) CAPS Take 1,000 Units by mouth daily.     clomiPRAMINE (ANAFRANIL) 25 MG capsule Take 25 mg by mouth at bedtime. Take 1 capsule at bedtime for 3 days then 2 capsules for 3 days then 3 capsules at bedtime     diclofenac Sodium (VOLTAREN) 1 % GEL Apply 2 g topically 4 (four) times daily. 50 g 0   gabapentin (NEURONTIN)  100 MG capsule Take 1 capsule (100 mg total) by mouth 3 (three) times daily. For agitation 90 capsule 0   haloperidol (HALDOL) 10 MG tablet Take 1 tablet (10 mg total) by mouth at bedtime. For mood stabilization 30 tablet 0   insulin aspart protamine- aspart (NOVOLOG MIX 70/30) (70-30) 100 UNIT/ML injection Inject 20 Units into the skin 2 (two) times daily with a meal.     lithium carbonate (ESKALITH) 450 MG ER tablet Take 1 tablet (450 mg total) by mouth at bedtime. For mood stabilization 30 tablet 0   metoprolol tartrate (LOPRESSOR) 25 MG tablet Take 12.5 mg by mouth 2 (two) times daily.     omeprazole (PRILOSEC) 20 MG capsule Take 20 mg by mouth daily.     ondansetron (ZOFRAN-ODT) 4 MG disintegrating tablet Take 1 tablet (4 mg total) by mouth every 8 (eight) hours as needed for nausea or vomiting. 10 tablet 0   simvastatin (ZOCOR) 40 MG tablet Take 40 mg by mouth at bedtime.     traZODone (DESYREL) 50 MG tablet Take 1 tablet (50 mg total) by mouth at bedtime as needed for sleep. 30 tablet 0   venlafaxine XR (EFFEXOR-XR) 150 MG 24 hr capsule Take 150 mg by mouth daily with breakfast.     venlafaxine XR (EFFEXOR-XR) 75 MG 24 hr capsule Take 1 capsule (75 mg total) by mouth daily with breakfast. For depression 30 capsule 0    PTA Medications:  Facility Ordered Medications  Medication   acetaminophen (TYLENOL) tablet 650 mg   alum & mag hydroxide-simeth (MAALOX/MYLANTA) 200-200-20 MG/5ML suspension 30 mL   magnesium hydroxide (MILK OF MAGNESIA) suspension 30 mL   OLANZapine zydis (ZYPREXA) disintegrating tablet 10 mg   And   LORazepam (ATIVAN) tablet 1 mg   And   ziprasidone (GEODON) injection 20 mg   amLODipine (NORVASC) tablet 10 mg   benztropine (COGENTIN) tablet 0.5 mg   cholecalciferol (VITAMIN D3) 25 MCG (1000 UNIT) tablet 1,000 Units   clomiPRAMINE (ANAFRANIL) capsule 25 mg   gabapentin (NEURONTIN) capsule 100 mg   haloperidol (HALDOL) tablet 10 mg   insulin aspart protamine-  aspart (NOVOLOG MIX 70/30) injection 20 Units   lithium carbonate (ESKALITH) ER tablet 450 mg   metoprolol tartrate (LOPRESSOR) tablet 12.5 mg   pantoprazole (  PROTONIX) EC tablet 40 mg   ondansetron (ZOFRAN-ODT) disintegrating tablet 4 mg   simvastatin (ZOCOR) tablet 40 mg   traZODone (DESYREL) tablet 50 mg   venlafaxine XR (EFFEXOR-XR) 24 hr capsule 150 mg   venlafaxine XR (EFFEXOR-XR) 24 hr capsule 75 mg   PTA Medications  Medication Sig   metoprolol tartrate (LOPRESSOR) 25 MG tablet Take 12.5 mg by mouth 2 (two) times daily.   Cholecalciferol (VITAMIN D3) 25 MCG (1000 UT) CAPS Take 1,000 Units by mouth daily.   simvastatin (ZOCOR) 40 MG tablet Take 40 mg by mouth at bedtime.   amLODipine (NORVASC) 10 MG tablet Take 10 mg by mouth daily.   omeprazole (PRILOSEC) 20 MG capsule Take 20 mg by mouth daily.   insulin aspart protamine- aspart (NOVOLOG MIX 70/30) (70-30) 100 UNIT/ML injection Inject 20 Units into the skin 2 (two) times daily with a meal.   haloperidol (HALDOL) 10 MG tablet Take 1 tablet (10 mg total) by mouth at bedtime. For mood stabilization   venlafaxine XR (EFFEXOR-XR) 75 MG 24 hr capsule Take 1 capsule (75 mg total) by mouth daily with breakfast. For depression   traZODone (DESYREL) 50 MG tablet Take 1 tablet (50 mg total) by mouth at bedtime as needed for sleep.   lithium carbonate (ESKALITH) 450 MG ER tablet Take 1 tablet (450 mg total) by mouth at bedtime. For mood stabilization   benztropine (COGENTIN) 0.5 MG tablet Take 1 tablet (0.5 mg total) by mouth 2 (two) times daily. For eps   gabapentin (NEURONTIN) 100 MG capsule Take 1 capsule (100 mg total) by mouth 3 (three) times daily. For agitation   diclofenac Sodium (VOLTAREN) 1 % GEL Apply 2 g topically 4 (four) times daily.   ondansetron (ZOFRAN-ODT) 4 MG disintegrating tablet Take 1 tablet (4 mg total) by mouth every 8 (eight) hours as needed for nausea or vomiting.   venlafaxine XR (EFFEXOR-XR) 150 MG 24 hr capsule  Take 150 mg by mouth daily with breakfast.   clomiPRAMINE (ANAFRANIL) 25 MG capsule Take 25 mg by mouth at bedtime. Take 1 capsule at bedtime for 3 days then 2 capsules for 3 days then 3 capsules at bedtime       07/03/2022    1:22 AM 04/09/2022    8:58 PM 01/10/2022    6:13 PM  Depression screen PHQ 2/9  Decreased Interest '1 1 2  '$ Down, Depressed, Hopeless '1 1 2  '$ PHQ - 2 Score '2 2 4  '$ Altered sleeping '2 2 2  '$ Tired, decreased energy '2 2 1  '$ Change in appetite '1 1 2  '$ Feeling bad or failure about yourself  '2 2 2  '$ Trouble concentrating '1 1 2  '$ Moving slowly or fidgety/restless '2 1 2  '$ Suicidal thoughts '1 1 2  '$ PHQ-9 Score '13 12 17  '$ Difficult doing work/chores Somewhat difficult Somewhat difficult Very difficult    Flowsheet Row ED from 07/03/2022 in [facility] ED from 06/30/2022 in [facility] Emergency Department at [facility] ED from 06/19/2022 in [facility] Emergency Department at [place] No Risk High Risk High Risk       Musculoskeletal  Strength & Muscle Tone: within normal limits Gait & Station: normal Patient leans: N/A  Psychiatric Specialty Exam  Presentation  General Appearance:  Appropriate for Environment; Casual  Eye Contact: Good  Speech: Clear and Coherent; Normal Rate  Speech Volume: Normal  Handedness: Right   Mood and Affect  Mood: Euthymic  Affect: Appropriate; Congruent   Thought Process  Thought Processes: Coherent; Goal Directed; Linear  Descriptions of Associations:Intact  Orientation:Full (Time, Place and Person)  Thought Content:Logical; WDL  Diagnosis of Schizophrenia or Schizoaffective disorder in past: No    Hallucinations:Hallucinations: None  Ideas of Reference:None  Suicidal Thoughts:Suicidal Thoughts: No SI Active Intent and/or Plan: With Intent; With Plan  Homicidal Thoughts:Homicidal Thoughts: No   Sensorium  Memory: Immediate Good; Recent  Fair  Judgment: Fair  Insight: Present   Executive Functions  Concentration: Fair  Attention Span: Good  Recall: Good  Fund of Knowledge: Fair  Language: Fair   Psychomotor Activity  Psychomotor Activity: Psychomotor Activity: Normal   Assets  Assets: Communication Skills; Desire for Improvement; Housing; Leisure Time; Physical Health; Resilience; Social Support   Sleep  Sleep: Sleep: Good Number of Hours of Sleep: 8   Nutritional Assessment (For OBS and FBC admissions only) Has the patient had a weight loss or gain of 10 pounds or more in the last 3 months?: Yes Has the patient had a decrease in food intake/or appetite?: No Does the patient have dental problems?: No Does the patient have eating habits or behaviors that may be indicators of an eating disorder including binging or inducing vomiting?: No Has the patient recently lost weight without trying?: 0 Has the patient been eating poorly because of a decreased appetite?: 0 Malnutrition Screening Tool Score: 0    Physical Exam  Physical Exam Vitals and nursing note reviewed.  Constitutional:      Appearance: Normal appearance. She is well-developed.  HENT:     Head: Normocephalic and atraumatic.     Nose: Nose normal.  Cardiovascular:     Rate and Rhythm: Normal rate.  Pulmonary:     Effort: Pulmonary effort is normal.  Musculoskeletal:        General: Normal range of motion.     Cervical back: Normal range of motion.  Skin:    General: Skin is warm and dry.  Neurological:     Mental Status: She is alert and oriented to person, place, and time.  Psychiatric:        Attention and Perception: Attention and perception normal.        Mood and Affect: Mood and affect normal.        Speech: Speech normal.        Behavior: Behavior normal. Behavior is cooperative.        Thought Content: Thought content normal.        Cognition and Memory: Cognition and memory normal.   Review of Systems   Constitutional: Negative.   HENT: Negative.    Eyes: Negative.   Respiratory: Negative.    Cardiovascular: Negative.   Gastrointestinal: Negative.   Genitourinary: Negative.   Musculoskeletal: Negative.   Skin: Negative.   Neurological: Negative.   Psychiatric/Behavioral: Negative.     Blood pressure 116/68, pulse 96, temperature 98.1 F (36.7 C), temperature source Oral, resp. rate 18, last menstrual period 05/25/2022, SpO2 97 %. There is no height or weight on file to calculate BMI.  Demographic Factors:  Living alone  Loss Factors: NA  Historical Factors: Prior suicide attempts  Risk Reduction Factors:   Sense of responsibility to family, Positive social support, Positive therapeutic relationship, and Positive coping skills or problem solving skills  Continued Clinical Symptoms:  Previous Psychiatric Diagnoses and Treatments  Cognitive Features That Contribute To Risk:  None    Suicide Risk:  Minimal: No identifiable suicidal  ideation.  Patients presenting with no risk factors but with morbid ruminations; may be classified as minimal risk based on the severity of the depressive symptoms  Plan Of Care/Follow-up recommendations:  Follow up with established outpatient [facility] team. Continue current medications.   Disposition: Discharge  Lucky Rathke, FNP 07/03/2022, 8:44 AM

## 2022-07-03 NOTE — ED Notes (Signed)
Patient A&O x 4, ambulatory. Patient discharged in no acute distress. Patient denied SI/HI, A/VH upon discharge. Patient verbalized understanding of all discharge instructions explained by staff, to include follow up appointments, RX's and safety plan. Pt belongings returned to patient from locker # 29 intact. Patient escorted to lobby via staff for transport to destination. Safety maintained. Patient states that she would like act team to take her home. States when she called them they stated that they were in a meeting. States that she will call them back.Rn advised her that if she needs a bus pass to notify front desk and will notify rn.Patient verbalized understanding

## 2022-07-03 NOTE — ED Notes (Signed)
Notified provider that patient did not take her venlafaxine this am. Also notified provider that patient reports 4 loose stools.

## 2022-07-03 NOTE — ED Notes (Signed)
Patient  sleeping in no acute stress. RR even and unlabored .Environment secured .Will continue to monitor for safely. 

## 2022-07-03 NOTE — Discharge Instructions (Signed)
Patient is instructed prior to discharge to:  Take all medications as prescribed by his/her mental healthcare provider. Report any adverse effects and or reactions from the medicines to his/her outpatient provider promptly. Keep all scheduled appointments, to ensure that you are getting refills on time and to avoid any interruption in your medication.  If you are unable to keep an appointment call to reschedule.  Be sure to follow-up with resources and follow-up appointments provided.  Patient has been instructed & cautioned: To not engage in alcohol and or illegal drug use while on prescription medicines. In the event of worsening symptoms, patient is instructed to call the crisis hotline, 911 and or go to the nearest ED for appropriate evaluation and treatment of symptoms. To follow-up with his/her primary care provider for your other medical issues, concerns and or health care needs.  Information: -National Suicide Prevention Lifeline 1-800-SUICIDE or 1-800-273-8255.  -988 offers 24/7 access to trained crisis counselors who can help people experiencing mental health-related distress. People can call or text 988 or chat 988lifeline.org for themselves or if they are worried about a loved one who may need crisis support.      

## 2022-07-03 NOTE — ED Notes (Signed)
Patient alert and oriented x 3. Denies SI/HI/AVH. Denies intent or plan to harm self or others. Routine conducted according to faculty protocol. Encourage patient to notify staff with any needs or concerns. Patient verbalized agreement and understanding. Will continue to monitor for safety. 

## 2022-07-03 NOTE — ED Triage Notes (Signed)
Pt presents to [facility] voluntarily, accompanied by [employer] with complaint of suicidal ideation with a plan to overdose on medications. Pt was seen at [place] recently for suicide attempt and overdose on Effexor and Gapabentin. Pt states that her medications are not working and that she was involved in an argument with her boyfriend at home tonight and a friend called the police. Pt has significant history of psychiatric hospitalizations. Pt currently denies HI, AVH and substance/alcohol use.

## 2022-07-05 ENCOUNTER — Other Ambulatory Visit: Payer: Self-pay

## 2022-07-05 ENCOUNTER — Emergency Department (HOSPITAL_COMMUNITY)
Admission: EM | Admit: 2022-07-05 | Discharge: 2022-07-06 | Disposition: A | Discharge status: Home / Self Care | Payer: Medicaid Other | Attending: Emergency Medicine | Admitting: Emergency Medicine

## 2022-07-05 ENCOUNTER — Emergency Department (HOSPITAL_COMMUNITY): Payer: Medicaid Other

## 2022-07-05 DIAGNOSIS — R42 Dizziness and giddiness: Secondary | ICD-10-CM | POA: Insufficient documentation

## 2022-07-05 DIAGNOSIS — I10 Essential (primary) hypertension: Secondary | ICD-10-CM | POA: Insufficient documentation

## 2022-07-05 DIAGNOSIS — R000 Tachycardia, unspecified: Secondary | ICD-10-CM | POA: Diagnosis not present

## 2022-07-05 DIAGNOSIS — Z1152 Encounter for screening for COVID-19: Secondary | ICD-10-CM | POA: Diagnosis not present

## 2022-07-05 DIAGNOSIS — F4329 Adjustment disorder with other symptoms: Secondary | ICD-10-CM | POA: Diagnosis present

## 2022-07-05 DIAGNOSIS — R45851 Suicidal ideations: Secondary | ICD-10-CM | POA: Insufficient documentation

## 2022-07-05 DIAGNOSIS — D72829 Elevated white blood cell count, unspecified: Secondary | ICD-10-CM | POA: Diagnosis not present

## 2022-07-05 DIAGNOSIS — Z794 Long term (current) use of insulin: Secondary | ICD-10-CM | POA: Insufficient documentation

## 2022-07-05 DIAGNOSIS — D649 Anemia, unspecified: Secondary | ICD-10-CM | POA: Insufficient documentation

## 2022-07-05 DIAGNOSIS — Z79899 Other long term (current) drug therapy: Secondary | ICD-10-CM | POA: Diagnosis not present

## 2022-07-05 DIAGNOSIS — F329 Major depressive disorder, single episode, unspecified: Secondary | ICD-10-CM | POA: Diagnosis present

## 2022-07-05 DIAGNOSIS — E1165 Type 2 diabetes mellitus with hyperglycemia: Secondary | ICD-10-CM | POA: Diagnosis not present

## 2022-07-05 DIAGNOSIS — F430 Acute stress reaction: Secondary | ICD-10-CM

## 2022-07-05 DIAGNOSIS — J111 Influenza due to unidentified influenza virus with other respiratory manifestations: Secondary | ICD-10-CM

## 2022-07-05 DIAGNOSIS — R531 Weakness: Secondary | ICD-10-CM | POA: Insufficient documentation

## 2022-07-05 LAB — BASIC METABOLIC PANEL
Anion gap: 6 (ref 5–15)
BUN: 6 mg/dL (ref 6–20)
CO2: 26 mmol/L (ref 22–32)
Calcium: 9.4 mg/dL (ref 8.9–10.3)
Chloride: 106 mmol/L (ref 98–111)
Creatinine, Ser: 0.73 mg/dL (ref 0.44–1.00)
GFR, Estimated: >60 mL/min (ref >60)
Glucose, Bld: 172 mg/dL — ABNORMAL HIGH (ref 70–99)
Potassium: 3.9 mmol/L (ref 3.5–5.1)
Sodium: 138 mmol/L (ref 135–145)

## 2022-07-05 LAB — RAPID URINE DRUG SCREEN, HOSP PERFORMED
Amphetamines: NOT DETECTED
Barbiturates: NOT DETECTED
Benzodiazepines: NOT DETECTED
Cocaine: NOT DETECTED
Opiates: NOT DETECTED
Tetrahydrocannabinol: NOT DETECTED

## 2022-07-05 LAB — RESP PANEL BY RT-PCR (RSV, FLU A&B, COVID)  RVPGX2
Influenza A by PCR: NEGATIVE
Influenza B by PCR: POSITIVE — AB
Resp Syncytial Virus by PCR: NEGATIVE
SARS Coronavirus 2 by RT PCR: NEGATIVE

## 2022-07-05 LAB — CBC
HCT: 38.4 % (ref 36.0–46.0)
Hemoglobin: 11.3 g/dL — ABNORMAL LOW (ref 12.0–15.0)
MCH: 24.2 pg — ABNORMAL LOW (ref 26.0–34.0)
MCHC: 29.4 g/dL — ABNORMAL LOW (ref 30.0–36.0)
MCV: 82.2 fL (ref 80.0–100.0)
Platelets: 221 10*3/uL (ref 150–400)
RBC: 4.67 MIL/uL (ref 3.87–5.11)
RDW: 16.6 % — ABNORMAL HIGH (ref 11.5–15.5)
WBC: 11.6 10*3/uL — ABNORMAL HIGH (ref 4.0–10.5)
nRBC: 0 % (ref 0.0–0.2)

## 2022-07-05 LAB — SALICYLATE LEVEL: Salicylate Lvl: <7 mg/dL — ABNORMAL LOW (ref 7.0–30.0)

## 2022-07-05 LAB — ETHANOL: Alcohol, Ethyl (B): <10 mg/dL (ref <10)

## 2022-07-05 LAB — ACETAMINOPHEN LEVEL: Acetaminophen (Tylenol), Serum: <10 ug/mL — ABNORMAL LOW (ref 10–30)

## 2022-07-05 MED ORDER — SODIUM CHLORIDE 0.9 % IV BOLUS
1000.0000 mL | Freq: Once | INTRAVENOUS | Status: AC
  Administered 2022-07-05: 1000 mL via INTRAVENOUS

## 2022-07-05 MED ORDER — LORAZEPAM 1 MG PO TABS
1.0000 mg | ORAL_TABLET | Freq: Once | ORAL | Status: AC
  Administered 2022-07-05: 1 mg via ORAL
  Filled 2022-07-05: qty 1

## 2022-07-05 NOTE — ED Notes (Signed)
Talking to TTS now

## 2022-07-05 NOTE — ED Triage Notes (Signed)
Pt BIB [facility] from home. Pt original complaint was dizziness/weakness. Upon ems arrival pt c/o anxiety and SI.  180/90 100HR 197cbg 16RR

## 2022-07-05 NOTE — ED Provider Notes (Signed)
[facility] Provider Note   CSN: [id number] Arrival date & time: 07/05/22  1918     History  Chief Complaint  Patient presents with   Dizziness   Suicidal    [name] is a 32 y.o. female with past medical history significant for SI, previous overdose secondary to SI, schizoaffective disorder, bipolar disorder, hypertension, tobacco abuse, learning disability, diabetes who presents with concern for dizziness, weakness at home, on EMS arrival she denied dizziness, weakness and reported anxiety, and suicidal ideation.  Patient reports that she plans to overdose on pills.  She denies any HI, or audiovisual hallucinations.   Dizziness      Home Medications Prior to Admission medications   Medication Sig Start Date End Date Taking? Authorizing Provider  amLODipine (NORVASC) 10 MG tablet Take 10 mg by mouth daily.    [provider]  benztropine (COGENTIN) 0.5 MG tablet Take 1 tablet (0.5 mg total) by mouth 2 (two) times daily. For eps 04/14/22   Lindell Spar I, NP  Cholecalciferol (VITAMIN D3) 25 MCG (1000 UT) CAPS Take 1,000 Units by mouth daily.    [provider]  clomiPRAMINE (ANAFRANIL) 25 MG capsule Take 25 mg by mouth at bedtime. Take 1 capsule at bedtime for 3 days then 2 capsules for 3 days then 3 capsules at bedtime    [provider]  diclofenac Sodium (VOLTAREN) 1 % GEL Apply 2 g topically 4 (four) times daily. 06/07/22   Antonietta Breach, PA-C  gabapentin (NEURONTIN) 100 MG capsule Take 1 capsule (100 mg total) by mouth 3 (three) times daily. For agitation 04/14/22   Lindell Spar I, NP  haloperidol (HALDOL) 10 MG tablet Take 1 tablet (10 mg total) by mouth at bedtime. For mood stabilization 04/14/22   Nwoko, Herbert Pun I, NP  insulin aspart protamine- aspart (NOVOLOG MIX 70/30) (70-30) 100 UNIT/ML injection Inject 20 Units into the skin 2 (two) times daily with a meal.    [provider]  lithium  carbonate (ESKALITH) 450 MG ER tablet Take 1 tablet (450 mg total) by mouth at bedtime. For mood stabilization 04/14/22   Lindell Spar I, NP  metoprolol tartrate (LOPRESSOR) 25 MG tablet Take 12.5 mg by mouth 2 (two) times daily.    [provider]  omeprazole (PRILOSEC) 20 MG capsule Take 20 mg by mouth daily.    [provider]  ondansetron (ZOFRAN-ODT) 4 MG disintegrating tablet Take 1 tablet (4 mg total) by mouth every 8 (eight) hours as needed for nausea or vomiting. 06/15/22   Barrett Henle, MD  simvastatin (ZOCOR) 40 MG tablet Take 40 mg by mouth at bedtime.    [provider]  traZODone (DESYREL) 50 MG tablet Take 1 tablet (50 mg total) by mouth at bedtime as needed for sleep. 04/14/22   Lindell Spar I, NP  venlafaxine XR (EFFEXOR-XR) 150 MG 24 hr capsule Take 150 mg by mouth daily with breakfast.    [provider]  venlafaxine XR (EFFEXOR-XR) 75 MG 24 hr capsule Take 1 capsule (75 mg total) by mouth daily with breakfast. For depression 04/15/22   Lindell Spar I, NP      Allergies    Wellbutrin [bupropion], Omnipaque [iohexol], Penicillins, Atarax [hydroxyzine], Contrast media [iodinated contrast media], and Cymbalta [duloxetine hcl]    Review of Systems   Review of Systems  Neurological:  Positive for dizziness.  All other systems reviewed and are negative.   Physical Exam Updated  Vital Signs BP (!) 168/107   Pulse (!) 124   Temp 97.6 F (36.4 C) (Oral)   Resp 16   LMP 05/06/2022 (Approximate)   SpO2 98%  Physical Exam Vitals and nursing note reviewed.  Constitutional:      General: She is not in acute distress.    Appearance: Normal appearance.  HENT:     Head: Normocephalic and atraumatic.  Eyes:     General:        Right eye: No discharge.        Left eye: No discharge.  Cardiovascular:     Rate and Rhythm: Regular rhythm. Tachycardia present.     Heart sounds: No murmur heard.    No friction rub. No gallop.  Pulmonary:      Effort: Pulmonary effort is normal.     Breath sounds: Normal breath sounds.     Comments: Some congestion, rhonchi in upper lung fields, no wheezing, stridor, no focal consolidation Abdominal:     General: Bowel sounds are normal.     Palpations: Abdomen is soft.  Skin:    General: Skin is warm and dry.     Capillary Refill: Capillary refill takes less than 2 seconds.  Neurological:     Mental Status: She is alert and oriented to person, place, and time.  Psychiatric:        Mood and Affect: Mood normal.        Behavior: Behavior normal.     Comments: Endorses active SI with plan     ED Results / Procedures / Treatments   Labs (all labs ordered are listed, but only abnormal results are displayed) Labs Reviewed  RESP PANEL BY RT-PCR (RSV, FLU A&B, COVID)  RVPGX2 - Abnormal; Notable for the following components:      Result Value   Influenza B by PCR POSITIVE (*)    All other components within normal limits  CBC - Abnormal; Notable for the following components:   WBC 11.6 (*)    Hemoglobin 11.3 (*)    MCH 24.2 (*)    MCHC 29.4 (*)    RDW 16.6 (*)    All other components within normal limits  BASIC METABOLIC PANEL - Abnormal; Notable for the following components:   Glucose, Bld 172 (*)    All other components within normal limits  ACETAMINOPHEN LEVEL - Abnormal; Notable for the following components:   Acetaminophen (Tylenol), Serum <10 (*)    All other components within normal limits  SALICYLATE LEVEL - Abnormal; Notable for the following components:   Salicylate Lvl [id number] (*)    All other components within normal limits  ETHANOL  RAPID URINE DRUG SCREEN, HOSP PERFORMED    EKG None  Radiology DG Chest 2 View  Result Date: 07/05/2022 CLINICAL DATA:  Shortness of breath, dizziness and weakness. EXAM: CHEST - 2 VIEW COMPARISON:  Portable chest 12/11/2021 FINDINGS: The heart size and mediastinal contours are within normal limits. Both lungs are clear. The visualized  skeletal structures are unremarkable. IMPRESSION: No active cardiopulmonary disease.  Stable chest. Electronically Signed   By: Telford Nab M.D.   On: 07/05/2022 20:48    Procedures Procedures    Medications Ordered in ED Medications  sodium chloride 0.9 % bolus 1,000 mL (has no administration in time range)  LORazepam (ATIVAN) tablet 1 mg (1 mg Oral Given 07/05/22 2035)    ED Course/ Medical Decision Making/ A&P Clinical Course as of 07/05/22 2227  Sun Jul 05, 2022  2125 Patient has the flu, otherwise medically cleared for TTS eval [CP]    Clinical Course User Index [CP] Anselmo Pickler, PA-C                             Medical Decision Making Amount and/or Complexity of Data Reviewed Labs: ordered. Radiology: ordered.  Risk Prescription drug management.   This patient is a 32 y.o. female  who presents to the ED for concern of psychiatric complaint, as well as tachycardia, weakness.   Differential diagnoses prior to evaluation: The emergent differential diagnosis includes, but is not limited to, upper respiratory infection, pneumonia, SI, other upper respiratory infection, electrolyte derangement, overdose or medication overuse touch base with. This is not an exhaustive differential.   Past Medical History / Co-morbidities: SI, previous overdose secondary to SI, schizoaffective disorder, bipolar disorder, hypertension, tobacco abuse, learning disability, diabetes  Additional history: Chart reviewed. Pertinent results include: Reviewed lab work, imaging from recent previous emergency department visits, notably patient with many visits in the last 2 weeks, including several for malingering, but also notably with positive intentional overdose within the last month  Physical Exam: Physical exam performed. The pertinent findings include: Patient is tachycardic but not febrile, she is somewhat hypertensive with blood pressure 168/107.  She has stable oxygen saturation on  room air.  She does have some scattered rhonchi in upper lung fields.  Lab Tests/Imaging studies: I personally interpreted labs/imaging and the pertinent results include: BMP with mild hyperglycemia, glucose 172, CBC notable for mild leukocytosis, white blood cells 11.6, mild anemia, hemoglobin 11.3.  No evidence of toxic ingestion, UDS unremarkable, RVP is positive for flu B which is likely to explain her dizziness, weakness, and tachycardia today.  Cardiac monitoring: EKG obtained and interpreted by my attending physician which shows: sinus tachycardia   Medications: I ordered medication including fluid bolus for tachycardia, Ativan for anxiety.  I have reviewed the patients home medicines and have made adjustments as needed.  At this time patient is medically cleared for TTS consult, they are likely to delay any possible admission given she is flu positive at this time, we will treat her flu symptomatically.  Disposition: After consideration of the diagnostic results and the patients response to treatment, I feel that patient would benefit from TTS eval for psychiatric clearance given SI at this time.   Although she has the flu, her presentation is stable even with some tachycardia, I think that she is stable. Medically cleared for TTS consult Final Clinical Impression(s) / ED Diagnoses Final diagnoses:  None    Rx / DC Orders ED Discharge Orders     None         Dorien Chihuahua 07/05/22 2227    Dorie Rank, MD 07/08/22 [phone]

## 2022-07-06 DIAGNOSIS — F4329 Adjustment disorder with other symptoms: Secondary | ICD-10-CM

## 2022-07-06 MED ORDER — LITHIUM CARBONATE ER 450 MG PO TBCR: 450.0000 mg | EXTENDED_RELEASE_TABLET | Freq: Every day | ORAL | Status: DC

## 2022-07-06 MED ORDER — SODIUM CHLORIDE 0.9 % IV BOLUS
1000.0000 mL | Freq: Once | INTRAVENOUS | Status: AC
  Administered 2022-07-06: 1000 mL via INTRAVENOUS

## 2022-07-06 MED ORDER — METOPROLOL TARTRATE 25 MG PO TABS
25.0000 mg | ORAL_TABLET | Freq: Two times a day (BID) | ORAL | Status: DC
  Administered 2022-07-06: 25 mg via ORAL

## 2022-07-06 MED ORDER — PANTOPRAZOLE SODIUM 40 MG PO TBEC
40.0000 mg | DELAYED_RELEASE_TABLET | Freq: Every day | ORAL | Status: DC
  Filled 2022-07-06: qty 1

## 2022-07-06 MED ORDER — METOPROLOL TARTRATE 25 MG PO TABS
12.5000 mg | ORAL_TABLET | Freq: Two times a day (BID) | ORAL | Status: DC
  Filled 2022-07-06: qty 1

## 2022-07-06 MED ORDER — INSULIN ASPART PROT & ASPART (70-30 MIX) 100 UNIT/ML ~~LOC~~ SUSP
20.0000 [IU] | Freq: Two times a day (BID) | SUBCUTANEOUS | Status: DC
  Administered 2022-07-06: 20 [IU] via SUBCUTANEOUS
  Filled 2022-07-06: qty 10

## 2022-07-06 MED ORDER — HALOPERIDOL 5 MG PO TABS: 10.0000 mg | ORAL_TABLET | Freq: Every day | ORAL | Status: DC

## 2022-07-06 MED ORDER — SIMVASTATIN 20 MG PO TABS: 40.0000 mg | ORAL_TABLET | Freq: Every day | ORAL | Status: DC

## 2022-07-06 MED ORDER — ACETAMINOPHEN 500 MG PO TABS: 1000.0000 mg | ORAL_TABLET | Freq: Four times a day (QID) | ORAL | Status: DC | PRN

## 2022-07-06 MED ORDER — BENZTROPINE MESYLATE 0.5 MG PO TABS
0.5000 mg | ORAL_TABLET | Freq: Two times a day (BID) | ORAL | Status: DC
  Administered 2022-07-06: 0.5 mg via ORAL
  Filled 2022-07-06: qty 1

## 2022-07-06 MED ORDER — AMLODIPINE BESYLATE 5 MG PO TABS
10.0000 mg | ORAL_TABLET | Freq: Every day | ORAL | Status: DC
  Administered 2022-07-06: 10 mg via ORAL
  Filled 2022-07-06: qty 2

## 2022-07-06 MED ORDER — VENLAFAXINE HCL ER 75 MG PO CP24
150.0000 mg | ORAL_CAPSULE | Freq: Every day | ORAL | Status: DC
  Administered 2022-07-06: 150 mg via ORAL
  Filled 2022-07-06: qty 2

## 2022-07-06 MED ORDER — TRAZODONE HCL 100 MG PO TABS: 50.0000 mg | ORAL_TABLET | Freq: Every evening | ORAL | Status: DC | PRN

## 2022-07-06 MED ORDER — GABAPENTIN 100 MG PO CAPS
100.0000 mg | ORAL_CAPSULE | Freq: Three times a day (TID) | ORAL | Status: DC
  Administered 2022-07-06: 100 mg via ORAL
  Filled 2022-07-06: qty 1

## 2022-07-06 MED ORDER — ATORVASTATIN CALCIUM 10 MG PO TABS: 20.0000 mg | ORAL_TABLET | Freq: Every day | ORAL | Status: DC

## 2022-07-06 MED ORDER — HALOPERIDOL 5 MG PO TABS
5.0000 mg | ORAL_TABLET | Freq: Three times a day (TID) | ORAL | Status: DC
  Administered 2022-07-06: 5 mg via ORAL
  Filled 2022-07-06: qty 1

## 2022-07-06 NOTE — ED Notes (Signed)
Belongings returned - black backpack and one patient bag.

## 2022-07-06 NOTE — Discharge Summary (Signed)
[facility] ED Discharge  07/06/2022 11:08 AM Netra Kelicia Puls  MRN:  [id number]  Principal Problem: Adjustment disorder with emotional disturbance Discharge Diagnoses: Principal Problem:   Adjustment disorder with emotional disturbance Active Problems:   Suicidal ideation  Clinical Impression:  Final diagnoses:  None   Subjective: [name] admitted with past hx of  schizoaffective disorder, bipolar type, Borderline Intellectual functioning, multiple intentional Suicide by OD and Generalized anxiety disorder brought in by EMS  with original c/o dizziness, weakness but on arrival to the resident patient endorses SI/Anxiety.  Patient was discharged from the ER last week and was discharged on the 9th of March.  Patient was discharged from [facility] inpatient [facility] unit  about 8 days ago. Patient was seen in bed coughing, stuffy nose and breathing through her mouth.  Patient tested positive for flu.  Patient denies SI/HI/AVH at this time but is asking for medications to  help with her congestion.  Patient states that protracted coughing and sneezing makes her dizzy.  Patient also admitted that moving to [place] will help her.  She has been wanting to move to [facility] and is in communication with her ACT team [facility]. Patient is currently taking Venlafaxine and Trazodone for Depression and  sleep.respectively. Provider reached out to [facility] team to speak with her assigned provider.  Staff states they are in a meeting at this time.   Provider spoke with Annie Main her ACT Team therapist who states that Jessica has become comfortable in the hospital setting and always looking fowrd to admission for few days so she can have company.  He also states that Renelle is living in a safe secured environment and changing her housing to [place] is not going to help her.  Annie Main states they are in the process of engaging her in [facility] so she can have some activities.   He agrees that patient can be discharged home and they will see her once she is free from Flu. We discussed safety plan-Call 911 or 988 for ant mental health crisis including but not limited to Suicide ideation.  Patient was also advised to utilize the services of her ACT TEAM for any mental health crisis.  Patient was also informed to go to [facility] for Mental health crisis. Patient is Psychiatrically cleared.  ED Assessment Time Calculation: Start Time: 0948 Stop Time: 1014 Total Time in Minutes (Assessment Completion): 26   Past Psychiatric History: see initial psychiatric evaluation note  Past Medical History:  Past Medical History:  Diagnosis Date   Anxiety    Bipolar 1 disorder ([place])    Cognitive deficits    Depression    Diabetes mellitus without complication ([place])    Hypertension    Mental disorder    Mental health disorder    Obesity     Past Surgical History:  Procedure Laterality Date   CESAREAN SECTION     CESAREAN SECTION N/A 04/25/2013   Procedure: REPEAT CESAREAN SECTION;  Surgeon: [name], MD;  Location: [facility];  Service: Obstetrics;  Laterality: N/A;   MASS EXCISION N/A 06/03/2012   Procedure: EXCISION MASS;  Surgeon: [name], MD;  Location: [facility];  Service: ENT;  Laterality: N/A;  Excision uvula mass   TONSILLECTOMY N/A 06/03/2012   Procedure: TONSILLECTOMY;  Surgeon: [name], MD;  Location: [facility];  Service: ENT;  Laterality: N/A;   TONSILLECTOMY     Family History:  Family History  Problem Relation Age of Onset   Hypertension Mother    Diabetes Father    Family Psychiatric  History: see initial psychiatric evaluation note Social History:  Social History   Substance and Sexual Activity  Alcohol Use Not Currently     Social History   Substance and Sexual Activity  Drug Use Not Currently   Types: "Crack" cocaine, Other-see comments   Comment: Patient  reports hx of smoking Crack    Social History   Socioeconomic History   Marital status: Single    Spouse name: Not on file   Number of children: Not on file   Years of education: Not on file   Highest education level: Not on file  Occupational History   Not on file  Tobacco Use   Smoking status: Every Day    Types: Cigars   Smokeless tobacco: Never   Tobacco comments:    Pt declined  Vaping Use   Vaping Use: Never used  Substance and Sexual Activity   Alcohol use: Not Currently   Drug use: Not Currently    Types: "Crack" cocaine, Other-see comments    Comment: Patient reports hx of smoking Crack   Sexual activity: Not Currently    Birth control/protection: None    Comment: occasionally  Other Topics Concern   Not on file  Social History Narrative   ** Merged History Encounter **       Social Determinants of Health   Financial Resource Strain: Not on file  Food Insecurity: No Food Insecurity (04/10/2022)   Hunger Vital Sign    Worried About Running Out of Food in the Last Year: Never true    Ran Out of Food in the Last Year: Never true  Transportation Needs: No Transportation Needs (04/10/2022)   PRAPARE - Hydrologist (Medical): No    Lack of Transportation (Non-Medical): No  Physical Activity: Not on file  Stress: Not on file  Social Connections: Not on file    Tobacco Cessation:  N/A, patient does not currently use tobacco products  Current Medications: Current Facility-Administered Medications  Medication Dose Route Frequency Provider Last Rate Last Admin   acetaminophen (TYLENOL) tablet 1,000 mg  1,000 mg Oral Q6H PRN Lajean Saver, MD       amLODipine (NORVASC) tablet 10 mg  10 mg Oral Daily Lajean Saver, MD   10 mg at 07/06/22 1015   atorvastatin (LIPITOR) tablet 20 mg  20 mg Oral QHS Lajean Saver, MD       benztropine (COGENTIN) tablet 0.5 mg  0.5 mg Oral BID Lajean Saver, MD   0.5 mg at 07/06/22 1015   gabapentin  (NEURONTIN) capsule 100 mg  100 mg Oral TID Lajean Saver, MD   100 mg at 07/06/22 1015   haloperidol (HALDOL) tablet 5 mg  5 mg Oral TID Lajean Saver, MD   5 mg at 07/06/22 1015   insulin aspart protamine- aspart (NOVOLOG MIX 70/30) injection 20 Units  20 Units Subcutaneous BID WC Lajean Saver, MD   20 Units at 07/06/22 1015   lithium carbonate (ESKALITH) ER tablet 450 mg  450 mg Oral QHS Lajean Saver, MD       metoprolol tartrate (LOPRESSOR) tablet 25 mg  25 mg Oral BID Lajean Saver, MD   25 mg at 07/06/22 1015   venlafaxine XR (EFFEXOR-XR) 24 hr capsule 150 mg  150 mg Oral Q breakfast Lajean Saver, MD   150 mg at 07/06/22  1015   Current Outpatient Medications  Medication Sig Dispense Refill   amLODipine (NORVASC) 10 MG tablet Take 10 mg by mouth daily.     benztropine (COGENTIN) 0.5 MG tablet Take 1 tablet (0.5 mg total) by mouth 2 (two) times daily. For eps 60 tablet 0   Cholecalciferol (VITAMIN D3) 25 MCG (1000 UT) CAPS Take 1,000 Units by mouth daily.     clomiPRAMINE (ANAFRANIL) 25 MG capsule Take 25 mg by mouth at bedtime. Take 1 capsule at bedtime for 3 days then 2 capsules for 3 days then 3 capsules at bedtime     diclofenac Sodium (VOLTAREN) 1 % GEL Apply 2 g topically 4 (four) times daily. 50 g 0   gabapentin (NEURONTIN) 100 MG capsule Take 1 capsule (100 mg total) by mouth 3 (three) times daily. For agitation 90 capsule 0   haloperidol (HALDOL) 10 MG tablet Take 1 tablet (10 mg total) by mouth at bedtime. For mood stabilization (Patient taking differently: Take 5 mg by mouth 3 (three) times daily. For mood stabilization) 30 tablet 0   insulin aspart protamine- aspart (NOVOLOG MIX 70/30) (70-30) 100 UNIT/ML injection Inject 20 Units into the skin 2 (two) times daily with a meal.     lithium carbonate (ESKALITH) 450 MG ER tablet Take 1 tablet (450 mg total) by mouth at bedtime. For mood stabilization 30 tablet 0   metoprolol tartrate (LOPRESSOR) 25 MG tablet Take 25 mg by mouth 2  (two) times daily.     ondansetron (ZOFRAN-ODT) 4 MG disintegrating tablet Take 1 tablet (4 mg total) by mouth every 8 (eight) hours as needed for nausea or vomiting. 10 tablet 0   simvastatin (ZOCOR) 40 MG tablet Take 40 mg by mouth at bedtime.     venlafaxine XR (EFFEXOR-XR) 150 MG 24 hr capsule Take 150 mg by mouth daily with breakfast.     traZODone (DESYREL) 50 MG tablet Take 1 tablet (50 mg total) by mouth at bedtime as needed for sleep. (Patient not taking: Reported on 07/06/2022) 30 tablet 0   venlafaxine XR (EFFEXOR-XR) 75 MG 24 hr capsule Take 1 capsule (75 mg total) by mouth daily with breakfast. For depression (Patient not taking: Reported on 07/06/2022) 30 capsule 0   PTA Medications: (Not in a hospital admission)   [place] Scale:  [facility] ED from 07/05/2022 in [facility] Emergency Department at [facility] ED from 07/03/2022 in [facility] ED from 06/30/2022 in [facility] Emergency Department at [place] High Risk No Risk High Risk       Musculoskeletal: Strength & Muscle Tone: within normal limits Gait & Station: normal Patient leans: Front  Psychiatric Specialty Exam: Presentation  General Appearance:  Casual; Other (comment) (obese)  Eye Contact: Good  Speech: Clear and Coherent; Normal Rate  Speech Volume: Normal  Handedness: Left   Mood and Affect  Mood: Anxious  Affect: Congruent   Thought Process  Thought Processes: Coherent  Descriptions of Associations:Intact  Orientation:Full (Time, Place and Person)  Thought Content:Logical  History of Schizophrenia/Schizoaffective disorder:Yes  Duration of Psychotic Symptoms:Greater than six months  Hallucinations:Hallucinations: None  Ideas of Reference:None  Suicidal Thoughts:Suicidal Thoughts: No  Homicidal Thoughts:Homicidal Thoughts: No   Sensorium  Memory: Immediate Good; Recent Good; Remote  Good  Judgment: Good  Insight: Good   Executive Functions  Concentration: Good  Attention Span: Good  Recall: Good  Fund of Knowledge: Good  Language: Good   Psychomotor  Activity  Psychomotor Activity: Psychomotor Activity: Normal   Assets  Assets: Armed forces logistics/support/administrative officer; Housing; Desire for Improvement; Leisure Time   Sleep  Sleep: Sleep: Good    Physical Exam: Physical Exam Vitals and nursing note reviewed.  Constitutional:      Appearance: Normal appearance.  HENT:     Head: Normocephalic and atraumatic.     Nose: Congestion present.  Cardiovascular:     Rate and Rhythm: Tachycardia present.  Pulmonary:     Effort: Pulmonary effort is normal.     Comments: Breathing through her mouth due to congestion-secondary to the Flu. Musculoskeletal:        General: Normal range of motion.     Cervical back: Normal range of motion.  Skin:    General: Skin is warm and dry.  Neurological:     General: No focal deficit present.     Mental Status: She is alert and oriented to person, place, and time.    Review of Systems  Constitutional:  Positive for malaise/fatigue.  HENT:  Positive for congestion.   Eyes: Negative.   Respiratory:  Positive for cough.        Congestion-secondary to the FLU  Cardiovascular: Negative.   Gastrointestinal: Negative.   Genitourinary: Negative.   Musculoskeletal:  Positive for myalgias.  Skin: Negative.   Neurological: Negative.   Endo/Heme/Allergies: Negative.   Psychiatric/Behavioral:  Positive for depression. The patient is nervous/anxious.    Blood pressure 123/65, pulse (!) 118, temperature 99.6 F (37.6 C), temperature source Oral, resp. rate (!) 22, last menstrual period 05/06/2022, SpO2 97 %. There is no height or weight on file to calculate BMI.   Demographic Factors:  Adolescent or young adult, Low socioeconomic status, Living alone, and Unemployed  Loss Factors: NA  Historical Factors: Prior suicide  attempts and Impulsivity  Risk Reduction Factors:   Positive social support and Engaged in ACT Team-[facility]  Continued Clinical Symptoms:  Depression:   Impulsivity Previous Psychiatric Diagnoses and Treatments  Cognitive Features That Contribute To Risk:  None    Suicide Risk:  Mild:  Suicidal ideation of limited frequency, intensity, duration, and specificity.  There are no identifiable plans, no associated intent, mild dysphoria and related symptoms, good self-control (both objective and subjective assessment), few other risk factors, and identifiable protective factors, including available and accessible social support.    Plan Of Care/Follow-up recommendations:  Activity:  as tolerated Diet:  Diabetic diet/Low calorie diet  Medical Decision Making: Patient denies SI/HI/AVH and is not a danger to self.  She admits that she tested positive for the Flu and is having difficulty with breathing.  She cannot test or smell anything.  She also is looking for change of housing to [place].  She states her ACT team is already looking for a place for her.  Patient will continue taking her medications.  She is Psychiatrically cleared.  Disposition:Psychiatrically cleared.  Delfin Gant, NP-PMHNP-BC 07/06/2022, 11:08 AM

## 2022-07-06 NOTE — Progress Notes (Addendum)
CSW requested [facility] Scharlene Gloss, RN to review pt for bed availability within the [facility] system; [facility] or [place]. 1st shift CSW Disposition will assist and follow up with [facility]. Pt meets inpatient behavioral health placement per [name], NP.    Benjaman Kindler, MSW, [facility] 07/06/2022 4:37 AM

## 2022-07-06 NOTE — ED Notes (Addendum)
Pt. Assisted to bathroom for safety and back to her room.pt. wanded by security.
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
## 2022-07-06 NOTE — ED Provider Notes (Signed)
Emergency Medicine Observation Re-evaluation Note  [name] is a 32 y.o. female, seen on rounds today.  Pt initially presented to the ED for complaints of flu symptoms, as well as transient SI.  Pt reports feeling a bit better currently. Denies chest pain or sob. +intermittent non prod cough and nasal congestion.  Earlier SI has resolved - pt currently denies feeling sad or acutely depressed, and is having no desire or plan to harm self.   Physical Exam  BP 123/65   Pulse 95   Temp 99.6 F (37.6 C) (Oral)   Resp (!) 22   LMP 05/06/2022 (Approximate)   SpO2 99%  Physical Exam General: calm, no distress.  Cardiac: regular rate. Lungs: breathing comfortably. Psych: normal mood and affect. Denies thoughts or plan to harm self or others. Pt does not appear to be responding to internal stimuli - no acute psychosis noted.   ED Course / MDM    I have reviewed the labs performed to date as well as medications administered while in observation.  Recent changes in the last 24 hours include ED obs, reassessment.   Plan  [facility] team has reassessed and indicates is psych clear for d/c.    Pt appears to be breathing comfortably. Hr 94, rr 16, pulse ox 99% room air.   Pt currently appears stable for d/c.   Return precautions provided.       Lajean Saver, MD 07/06/22 1321

## 2022-07-06 NOTE — Discharge Instructions (Addendum)
It was our pleasure to provide your ER care today - we hope that you feel better. Your flu test is positive. Drink plenty of fluids/stay well hydrated. You may take mucinex, nyquil or robitussin as need for symptom relief.   Follow up closely with primary care doctor and behavioral health provider in the coming week.  For mental health issues and/or crisis, you may also go directly to the [facility] - they are open 24/7 and walk-ins are welcome.    Return to ER if worse, new symptoms, fevers, chest pain, trouble breathing, or other emergency concern.

## 2022-07-09 ENCOUNTER — Emergency Department (HOSPITAL_COMMUNITY): Payer: Medicaid Other

## 2022-07-09 ENCOUNTER — Encounter (HOSPITAL_COMMUNITY): Payer: Self-pay | Admitting: Emergency Medicine

## 2022-07-09 ENCOUNTER — Emergency Department (HOSPITAL_COMMUNITY)
Admission: EM | Admit: 2022-07-09 | Discharge: 2022-07-09 | Disposition: A | Discharge status: Home / Self Care | Payer: Medicaid Other | Attending: Emergency Medicine | Admitting: Emergency Medicine

## 2022-07-09 DIAGNOSIS — R0981 Nasal congestion: Secondary | ICD-10-CM | POA: Diagnosis present

## 2022-07-09 DIAGNOSIS — E860 Dehydration: Secondary | ICD-10-CM | POA: Diagnosis not present

## 2022-07-09 DIAGNOSIS — R000 Tachycardia, unspecified: Secondary | ICD-10-CM | POA: Diagnosis not present

## 2022-07-09 DIAGNOSIS — Z794 Long term (current) use of insulin: Secondary | ICD-10-CM | POA: Diagnosis not present

## 2022-07-09 DIAGNOSIS — J101 Influenza due to other identified influenza virus with other respiratory manifestations: Secondary | ICD-10-CM

## 2022-07-09 DIAGNOSIS — Z1152 Encounter for screening for COVID-19: Secondary | ICD-10-CM | POA: Insufficient documentation

## 2022-07-09 DIAGNOSIS — N179 Acute kidney failure, unspecified: Secondary | ICD-10-CM | POA: Insufficient documentation

## 2022-07-09 LAB — ETHANOL: Alcohol, Ethyl (B): <10 mg/dL (ref <10)

## 2022-07-09 LAB — COMPREHENSIVE METABOLIC PANEL
ALT: 27 U/L (ref 0–44)
AST: 34 U/L (ref 15–41)
Albumin: 4.6 g/dL (ref 3.5–5.0)
Alkaline Phosphatase: 55 U/L (ref 38–126)
Anion gap: 14 (ref 5–15)
BUN: 6 mg/dL (ref 6–20)
CO2: 25 mmol/L (ref 22–32)
Calcium: 9.2 mg/dL (ref 8.9–10.3)
Chloride: 96 mmol/L — ABNORMAL LOW (ref 98–111)
Creatinine, Ser: 1.13 mg/dL — ABNORMAL HIGH (ref 0.44–1.00)
GFR, Estimated: >60 mL/min (ref >60)
Glucose, Bld: 347 mg/dL — ABNORMAL HIGH (ref 70–99)
Potassium: 3.7 mmol/L (ref 3.5–5.1)
Sodium: 135 mmol/L (ref 135–145)
Total Bilirubin: 0.3 mg/dL (ref 0.3–1.2)
Total Protein: 9 g/dL — ABNORMAL HIGH (ref 6.5–8.1)

## 2022-07-09 LAB — I-STAT BETA HCG BLOOD, ED (MC, WL, AP ONLY): I-stat hCG, quantitative: <5 m[IU]/mL (ref <5)

## 2022-07-09 LAB — RESP PANEL BY RT-PCR (RSV, FLU A&B, COVID)  RVPGX2
Influenza A by PCR: NEGATIVE
Influenza B by PCR: POSITIVE — AB
Resp Syncytial Virus by PCR: NEGATIVE
SARS Coronavirus 2 by RT PCR: NEGATIVE

## 2022-07-09 LAB — CBC
HCT: 42.2 % (ref 36.0–46.0)
Hemoglobin: 12.8 g/dL (ref 12.0–15.0)
MCH: 23.6 pg — ABNORMAL LOW (ref 26.0–34.0)
MCHC: 30.3 g/dL (ref 30.0–36.0)
MCV: 77.9 fL — ABNORMAL LOW (ref 80.0–100.0)
Platelets: 219 10*3/uL (ref 150–400)
RBC: 5.42 MIL/uL — ABNORMAL HIGH (ref 3.87–5.11)
RDW: 16.7 % — ABNORMAL HIGH (ref 11.5–15.5)
WBC: 6.9 10*3/uL (ref 4.0–10.5)
nRBC: 0 % (ref 0.0–0.2)

## 2022-07-09 LAB — ACETAMINOPHEN LEVEL: Acetaminophen (Tylenol), Serum: <10 ug/mL — ABNORMAL LOW (ref 10–30)

## 2022-07-09 LAB — SALICYLATE LEVEL: Salicylate Lvl: <7 mg/dL — ABNORMAL LOW (ref 7.0–30.0)

## 2022-07-09 MED ORDER — LACTATED RINGERS IV BOLUS
1000.0000 mL | Freq: Once | INTRAVENOUS | Status: AC
  Administered 2022-07-09: 1000 mL via INTRAVENOUS

## 2022-07-09 MED ORDER — ONDANSETRON HCL 4 MG/2ML IJ SOLN
4.0000 mg | Freq: Once | INTRAMUSCULAR | Status: AC
  Administered 2022-07-09: 4 mg via INTRAVENOUS
  Filled 2022-07-09: qty 2

## 2022-07-09 MED ORDER — LACTATED RINGERS IV BOLUS: 1000.0000 mL | Freq: Once | INTRAVENOUS | Status: DC

## 2022-07-09 MED ORDER — ONDANSETRON 4 MG PO TBDP: 4.0000 mg | ORAL_TABLET | Freq: Three times a day (TID) | ORAL | 0 refills | Status: DC | PRN

## 2022-07-09 MED ORDER — ACETAMINOPHEN 500 MG PO TABS
1000.0000 mg | ORAL_TABLET | Freq: Once | ORAL | Status: AC
  Administered 2022-07-09: 1000 mg via ORAL
  Filled 2022-07-09: qty 2

## 2022-07-09 NOTE — ED Notes (Signed)
Pt's belongings placed in triage bottom cabinet  1 book bag and 1 white hospital bag

## 2022-07-09 NOTE — ED Triage Notes (Signed)
Patient present due to no improvement in flu symptoms after 5 days. She also reports being suicidal for 10 days. She plans to overdose on her psych medication.    EMS vitals: 127 HR 96% SPO2 on room air  131/108 BP 402 CBG

## 2022-07-09 NOTE — ED Provider Notes (Signed)
[facility] Provider Note   CSN: [id number] Arrival date & time: 07/09/22  1124     History  Chief Complaint  Patient presents with   Suicidal   Influenza    [name] is a 32 y.o. female.  HPI 32 year old female presents with continued flu symptoms.  Symptoms started around the time but a little earlier than when she presented on 3/10.  On her 3/10 visit she was diagnosed with influenza.  She has been having cough, nasal congestion, chest pain when coughing, vomiting, both posttussive and non, and lightheadedness.  She feels like her symptoms are progressively worsening.  She denies any abdominal pain or headache.  She feels short of breath though she thinks that might be from the congestion more than anything else.  Patient reportedly talked about SI in triage.  Seems to endorse this with me but on repeat evaluation she denies SI.  Home Medications Prior to Admission medications   Medication Sig Start Date End Date Taking? Authorizing Provider  ondansetron (ZOFRAN-ODT) 4 MG disintegrating tablet Take 1 tablet (4 mg total) by mouth every 8 (eight) hours as needed for nausea or vomiting. 07/09/22  Yes Sherwood Gambler, MD  amLODipine (NORVASC) 10 MG tablet Take 10 mg by mouth daily.    [provider]  benztropine (COGENTIN) 0.5 MG tablet Take 1 tablet (0.5 mg total) by mouth 2 (two) times daily. For eps 04/14/22   Lindell Spar I, NP  Cholecalciferol (VITAMIN D3) 25 MCG (1000 UT) CAPS Take 1,000 Units by mouth daily.    [provider]  clomiPRAMINE (ANAFRANIL) 25 MG capsule Take 25 mg by mouth at bedtime. Take 1 capsule at bedtime for 3 days then 2 capsules for 3 days then 3 capsules at bedtime    [provider]  diclofenac Sodium (VOLTAREN) 1 % GEL Apply 2 g topically 4 (four) times daily. 06/07/22   Antonietta Breach, PA-C  gabapentin (NEURONTIN) 100 MG capsule Take 1 capsule (100 mg total) by mouth 3 (three)  times daily. For agitation 04/14/22   Lindell Spar I, NP  haloperidol (HALDOL) 10 MG tablet Take 1 tablet (10 mg total) by mouth at bedtime. For mood stabilization Patient taking differently: Take 5 mg by mouth 3 (three) times daily. For mood stabilization 04/14/22   Nwoko, Herbert Pun I, NP  insulin aspart protamine- aspart (NOVOLOG MIX 70/30) (70-30) 100 UNIT/ML injection Inject 20 Units into the skin 2 (two) times daily with a meal.    [provider]  lithium carbonate (ESKALITH) 450 MG ER tablet Take 1 tablet (450 mg total) by mouth at bedtime. For mood stabilization 04/14/22   Lindell Spar I, NP  metoprolol tartrate (LOPRESSOR) 25 MG tablet Take 25 mg by mouth 2 (two) times daily.    [provider]  simvastatin (ZOCOR) 40 MG tablet Take 40 mg by mouth at bedtime.    [provider]  traZODone (DESYREL) 50 MG tablet Take 1 tablet (50 mg total) by mouth at bedtime as needed for sleep. Patient not taking: Reported on 07/06/2022 04/14/22   Lindell Spar I, NP  venlafaxine XR (EFFEXOR-XR) 150 MG 24 hr capsule Take 150 mg by mouth daily with breakfast.    [provider]  venlafaxine XR (EFFEXOR-XR) 75 MG 24 hr capsule Take 1 capsule (75 mg total) by mouth daily with breakfast. For depression Patient not taking: Reported on 07/06/2022 04/15/22   Encarnacion Slates, NP  Allergies    Wellbutrin [bupropion], Omnipaque [iohexol], Penicillins, Atarax [hydroxyzine], Contrast media [iodinated contrast media], and Cymbalta [duloxetine hcl]    Review of Systems   Review of Systems  Constitutional:  Positive for fever (99).  HENT:  Positive for congestion.   Respiratory:  Positive for cough and shortness of breath.   Cardiovascular:  Positive for chest pain (with coughing).  Gastrointestinal:  Positive for vomiting. Negative for abdominal pain.  Neurological:  Positive for light-headedness. Negative for headaches.  Psychiatric/Behavioral:  Positive for suicidal ideas.  Negative for hallucinations.     Physical Exam Updated Vital Signs BP (!) 140/101   Pulse (!) 118   Temp 100.1 F (37.8 C) (Oral)   Resp 19   LMP 05/06/2022 (Approximate)   SpO2 96%  Physical Exam Vitals and nursing note reviewed.  Constitutional:      Appearance: She is well-developed. She is not ill-appearing or diaphoretic.  HENT:     Head: Normocephalic and atraumatic.  Cardiovascular:     Rate and Rhythm: Regular rhythm. Tachycardia present.     Heart sounds: Normal heart sounds.  Pulmonary:     Effort: Pulmonary effort is normal.     Breath sounds: Normal breath sounds. No wheezing.  Abdominal:     Palpations: Abdomen is soft.     Tenderness: There is no abdominal tenderness.  Skin:    General: Skin is warm and dry.  Neurological:     Mental Status: She is alert.  Psychiatric:        Attention and Perception: She does not perceive auditory or visual hallucinations.     ED Results / Procedures / Treatments   Labs (all labs ordered are listed, but only abnormal results are displayed) Labs Reviewed  RESP PANEL BY RT-PCR (RSV, FLU A&B, COVID)  RVPGX2 - Abnormal; Notable for the following components:      Result Value   Influenza B by PCR POSITIVE (*)    All other components within normal limits  COMPREHENSIVE METABOLIC PANEL - Abnormal; Notable for the following components:   Chloride 96 (*)    Glucose, Bld 347 (*)    Creatinine, Ser 1.13 (*)    Total Protein 9.0 (*)    All other components within normal limits  SALICYLATE LEVEL - Abnormal; Notable for the following components:   Salicylate Lvl Q000111Q (*)    All other components within normal limits  ACETAMINOPHEN LEVEL - Abnormal; Notable for the following components:   Acetaminophen (Tylenol), Serum <10 (*)    All other components within normal limits  CBC - Abnormal; Notable for the following components:   RBC 5.42 (*)    MCV 77.9 (*)    MCH 23.6 (*)    RDW 16.7 (*)    All other components within normal  limits  ETHANOL  RAPID URINE DRUG SCREEN, HOSP PERFORMED  I-STAT BETA HCG BLOOD, ED ([facility], [facility], [facility] ONLY)    EKG EKG Interpretation  Date/Time:  Thursday July 09 2022 12:44:58 EDT Ventricular Rate:  126 PR Interval:  142 QRS Duration: 84 QT Interval:  328 QTC Calculation: 475 R Axis:   130 Text Interpretation: Sinus tachycardia Right axis deviation Borderline repolarization abnormality nonspecific T wave changes similar to Jul 05 2022 Confirmed by Sherwood Gambler [phone]) on 07/09/2022 1:46:42 PM  Radiology DG Chest 2 View  Result Date: 07/09/2022 CLINICAL DATA:  Cough EXAM: CHEST - 2 VIEW COMPARISON:  07/05/2022 FINDINGS: Low lung volumes. Heart and mediastinal contours are within normal limits.  No focal opacities or effusions. No acute bony abnormality. IMPRESSION: Low volumes.  No active cardiopulmonary disease. Electronically Signed   By: Rolm Baptise M.D.   On: 07/09/2022 13:35    Procedures Procedures    Medications Ordered in ED Medications  lactated ringers bolus 1,000 mL (0 mLs Intravenous Hold 07/09/22 1339)  acetaminophen (TYLENOL) tablet 1,000 mg (1,000 mg Oral Given 07/09/22 1215)  ondansetron (ZOFRAN) injection 4 mg (4 mg Intravenous Given 07/09/22 1235)  lactated ringers bolus 1,000 mL (1,000 mLs Intravenous New Bag/Given 07/09/22 1236)    ED Course/ Medical Decision Making/ A&P                             Medical Decision Making Amount and/or Complexity of Data Reviewed Labs: ordered.    Details: Normal WBC.  Mild AKI but creatinine still less than 1.2. Radiology: ordered and independent interpretation performed.    Details: No lobar pneumonia.  Risk OTC drugs. Prescription drug management.   I suspect her not feeling well and tachycardia is from dehydration from the flu.  She was given IV fluids and her heart rate has improved and she is feeling a lot better.  Heart rate got as low as 110.  She is very anxious to leave despite not finishing the fluids that I  ordered.  I think this is reasonable given the improvement.  No evidence of pneumonia.  There was a note about SI from triage.  When I talk to patient, she seemed to agree that she was having some suicidal thoughts though when I talked to her multiple times later she is adamantly denying that and stating she never said it.  She continues to deny any SI currently.  She has been in and out of the ED and behavioral health urgent care for psychiatric complaints but at this point I think it is unlikely she is acutely suicidal or threat to herself.  I do not think there is a reason to involuntarily commit her.       Final Clinical Impression(s) / ED Diagnoses Final diagnoses:  Influenza B  Dehydration    Rx / DC Orders ED Discharge Orders          Ordered    ondansetron (ZOFRAN-ODT) 4 MG disintegrating tablet  Every 8 hours PRN        07/09/22 1635              Sherwood Gambler, MD 07/09/22 1640

## 2022-07-09 NOTE — Discharge Instructions (Addendum)
It seems that you were dehydrated today.  You need to make sure you are drinking increased fluids.  You are also being given nausea medicine to help with nausea.  Be sure to push extra fluids at home and follow-up with your primary care physician.  If you develop any suicidal thoughts or thoughts of harming yourself then make sure you follow-up with your psychiatrist and/or the [facility].  If you develop trouble breathing, coughing up blood, chest pain, or any other new/concerning symptoms and return to the ER or call 911.

## 2022-07-16 ENCOUNTER — Other Ambulatory Visit: Payer: Self-pay

## 2022-07-16 ENCOUNTER — Encounter (HOSPITAL_COMMUNITY): Payer: Self-pay

## 2022-07-16 ENCOUNTER — Emergency Department (HOSPITAL_COMMUNITY)
Admission: EM | Admit: 2022-07-16 | Discharge: 2022-07-18 | Disposition: A | Discharge status: Other Acute Inpatient Hospital | Payer: Medicaid Other | Attending: Emergency Medicine | Admitting: Emergency Medicine

## 2022-07-16 DIAGNOSIS — R45851 Suicidal ideations: Secondary | ICD-10-CM | POA: Insufficient documentation

## 2022-07-16 DIAGNOSIS — R443 Hallucinations, unspecified: Secondary | ICD-10-CM

## 2022-07-16 DIAGNOSIS — F259 Schizoaffective disorder, unspecified: Secondary | ICD-10-CM | POA: Diagnosis not present

## 2022-07-16 DIAGNOSIS — Z20822 Contact with and (suspected) exposure to covid-19: Secondary | ICD-10-CM | POA: Insufficient documentation

## 2022-07-16 DIAGNOSIS — F329 Major depressive disorder, single episode, unspecified: Secondary | ICD-10-CM | POA: Diagnosis not present

## 2022-07-16 DIAGNOSIS — Z794 Long term (current) use of insulin: Secondary | ICD-10-CM | POA: Diagnosis not present

## 2022-07-16 DIAGNOSIS — F29 Unspecified psychosis not due to a substance or known physiological condition: Secondary | ICD-10-CM | POA: Diagnosis not present

## 2022-07-16 DIAGNOSIS — N3000 Acute cystitis without hematuria: Secondary | ICD-10-CM | POA: Diagnosis not present

## 2022-07-16 LAB — COMPREHENSIVE METABOLIC PANEL
ALT: 40 U/L (ref 0–44)
AST: 38 U/L (ref 15–41)
Albumin: 3.9 g/dL (ref 3.5–5.0)
Alkaline Phosphatase: 55 U/L (ref 38–126)
Anion gap: 6 (ref 5–15)
BUN: <5 mg/dL — ABNORMAL LOW (ref 6–20)
CO2: 28 mmol/L (ref 22–32)
Calcium: 9.3 mg/dL (ref 8.9–10.3)
Chloride: 104 mmol/L (ref 98–111)
Creatinine, Ser: 0.85 mg/dL (ref 0.44–1.00)
GFR, Estimated: >60 mL/min (ref >60)
Glucose, Bld: 193 mg/dL — ABNORMAL HIGH (ref 70–99)
Potassium: 3.5 mmol/L (ref 3.5–5.1)
Sodium: 138 mmol/L (ref 135–145)
Total Bilirubin: 0.5 mg/dL (ref 0.3–1.2)
Total Protein: 7.6 g/dL (ref 6.5–8.1)

## 2022-07-16 LAB — CBC WITH DIFFERENTIAL/PLATELET
Abs Immature Granulocytes: 0.04 10*3/uL (ref 0.00–0.07)
Basophils Absolute: 0 10*3/uL (ref 0.0–0.1)
Basophils Relative: 0 %
Eosinophils Absolute: 0.2 10*3/uL (ref 0.0–0.5)
Eosinophils Relative: 2 %
HCT: 36.9 % (ref 36.0–46.0)
Hemoglobin: 11.4 g/dL — ABNORMAL LOW (ref 12.0–15.0)
Immature Granulocytes: 0 %
Lymphocytes Relative: 33 %
Lymphs Abs: 3.4 10*3/uL (ref 0.7–4.0)
MCH: 23.8 pg — ABNORMAL LOW (ref 26.0–34.0)
MCHC: 30.9 g/dL (ref 30.0–36.0)
MCV: 76.9 fL — ABNORMAL LOW (ref 80.0–100.0)
Monocytes Absolute: 0.6 10*3/uL (ref 0.1–1.0)
Monocytes Relative: 5 %
Neutro Abs: 6.2 10*3/uL (ref 1.7–7.7)
Neutrophils Relative %: 60 %
Platelets: 310 10*3/uL (ref 150–400)
RBC: 4.8 MIL/uL (ref 3.87–5.11)
RDW: 16.5 % — ABNORMAL HIGH (ref 11.5–15.5)
WBC: 10.4 10*3/uL (ref 4.0–10.5)
nRBC: 0 % (ref 0.0–0.2)

## 2022-07-16 LAB — URINALYSIS, MICROSCOPIC (REFLEX)

## 2022-07-16 LAB — WET PREP, GENITAL
Clue Cells Wet Prep HPF POC: NONE SEEN
Sperm: NONE SEEN
Trich, Wet Prep: NONE SEEN
WBC, Wet Prep HPF POC: <10 (ref <10)
Yeast Wet Prep HPF POC: NONE SEEN

## 2022-07-16 LAB — I-STAT BETA HCG BLOOD, ED (MC, WL, AP ONLY): I-stat hCG, quantitative: <5 m[IU]/mL (ref <5)

## 2022-07-16 LAB — RAPID URINE DRUG SCREEN, HOSP PERFORMED
Amphetamines: NOT DETECTED
Barbiturates: NOT DETECTED
Benzodiazepines: NOT DETECTED
Cocaine: NOT DETECTED
Opiates: NOT DETECTED
Tetrahydrocannabinol: NOT DETECTED

## 2022-07-16 LAB — URINALYSIS, ROUTINE W REFLEX MICROSCOPIC
Glucose, UA: 100 mg/dL — AB
Hgb urine dipstick: NEGATIVE
Ketones, ur: NEGATIVE mg/dL
Leukocytes,Ua: NEGATIVE
Nitrite: NEGATIVE
Protein, ur: 100 mg/dL — AB
Specific Gravity, Urine: >1.03 — ABNORMAL HIGH (ref 1.005–1.030)
pH: 6 (ref 5.0–8.0)

## 2022-07-16 LAB — ETHANOL: Alcohol, Ethyl (B): <10 mg/dL (ref <10)

## 2022-07-16 LAB — ACETAMINOPHEN LEVEL: Acetaminophen (Tylenol), Serum: <10 ug/mL — ABNORMAL LOW (ref 10–30)

## 2022-07-16 LAB — LITHIUM LEVEL: Lithium Lvl: 0.06 mmol/L — ABNORMAL LOW (ref 0.60–1.20)

## 2022-07-16 LAB — SALICYLATE LEVEL: Salicylate Lvl: <7 mg/dL — ABNORMAL LOW (ref 7.0–30.0)

## 2022-07-16 LAB — HIV ANTIBODY (ROUTINE TESTING W REFLEX): HIV Screen 4th Generation wRfx: NONREACTIVE

## 2022-07-16 MED ORDER — NITROFURANTOIN MONOHYD MACRO 100 MG PO CAPS
100.0000 mg | ORAL_CAPSULE | Freq: Two times a day (BID) | ORAL | Status: DC
  Administered 2022-07-16 – 2022-07-17 (×3): 100 mg via ORAL
  Filled 2022-07-16 (×4): qty 1

## 2022-07-16 MED ORDER — INSULIN ASPART 100 UNIT/ML IJ SOLN
0.0000 [IU] | Freq: Three times a day (TID) | INTRAMUSCULAR | Status: DC
  Administered 2022-07-17: 8 [IU] via SUBCUTANEOUS
  Administered 2022-07-17 (×2): 5 [IU] via SUBCUTANEOUS

## 2022-07-16 NOTE — ED Notes (Signed)
Paperwork is now in purple zone attached to the clipboard

## 2022-07-16 NOTE — ED Notes (Signed)
Pt belongings (1 black bookbag, sealed, and 1 white patient belongings bag, sealed) were placed in purple zone locker space #8.

## 2022-07-16 NOTE — ED Notes (Signed)
Pt is not ivc for now voluntary consent form attached to the clip board in orange zone

## 2022-07-16 NOTE — ED Provider Notes (Signed)
[facility] Provider Note   CSN: [id number] Arrival date & time: 07/16/22  1706     History  Chief Complaint  Patient presents with   Hallucinations   Suicidal    [name] is a 32 y.o. female.  32 year old female with a history of schizoaffective disorder, bipolar disorder, anxiety, and multiple suicide attempts who presents to the emergency department with complaints of seeing demons and suicidal ideations.  Patient reports that recently she has been seeing demons.  Says that they do not talk to her.  Has not been hearing other voices.  Does feel that they are watching her everywhere though.  Did see if you did all when she was at another facility and decided to come to the emergency department for evaluation.  Also reports that she has been having thoughts of killing herself by overdosing on her psychiatric medications which include lithium and Haldol.  Also has been having dysuria and vaginal itching for several days.  Has been complaining of thrush as well.  No fevers.  No other recent illnesses.  Denies any recent alcohol or recreational drug use.       Home Medications Prior to Admission medications   Medication Sig Start Date End Date Taking? Authorizing Provider  amLODipine (NORVASC) 10 MG tablet Take 10 mg by mouth daily.    [provider]  benztropine (COGENTIN) 0.5 MG tablet Take 1 tablet (0.5 mg total) by mouth 2 (two) times daily. For eps 04/14/22   Lindell Spar I, NP  Cholecalciferol (VITAMIN D3) 25 MCG (1000 UT) CAPS Take 1,000 Units by mouth daily.    [provider]  clomiPRAMINE (ANAFRANIL) 25 MG capsule Take 25 mg by mouth at bedtime. Take 1 capsule at bedtime for 3 days then 2 capsules for 3 days then 3 capsules at bedtime    [provider]  diclofenac Sodium (VOLTAREN) 1 % GEL Apply 2 g topically 4 (four) times daily. 06/07/22   Antonietta Breach, PA-C  gabapentin (NEURONTIN) 100 MG  capsule Take 1 capsule (100 mg total) by mouth 3 (three) times daily. For agitation 04/14/22   Lindell Spar I, NP  haloperidol (HALDOL) 10 MG tablet Take 1 tablet (10 mg total) by mouth at bedtime. For mood stabilization Patient taking differently: Take 5 mg by mouth 3 (three) times daily. For mood stabilization 04/14/22   Nwoko, Herbert Pun I, NP  insulin aspart protamine- aspart (NOVOLOG MIX 70/30) (70-30) 100 UNIT/ML injection Inject 20 Units into the skin 2 (two) times daily with a meal.    [provider]  lithium carbonate (ESKALITH) 450 MG ER tablet Take 1 tablet (450 mg total) by mouth at bedtime. For mood stabilization 04/14/22   Lindell Spar I, NP  metoprolol tartrate (LOPRESSOR) 25 MG tablet Take 25 mg by mouth 2 (two) times daily.    [provider]  ondansetron (ZOFRAN-ODT) 4 MG disintegrating tablet Take 1 tablet (4 mg total) by mouth every 8 (eight) hours as needed for nausea or vomiting. 07/09/22   Sherwood Gambler, MD  simvastatin (ZOCOR) 40 MG tablet Take 40 mg by mouth at bedtime.    [provider]  traZODone (DESYREL) 50 MG tablet Take 1 tablet (50 mg total) by mouth at bedtime as needed for sleep. Patient not taking: Reported on 07/06/2022 04/14/22   Lindell Spar I, NP  venlafaxine XR (EFFEXOR-XR) 150 MG 24 hr capsule Take 150 mg by mouth daily with breakfast.  [provider]  venlafaxine XR (EFFEXOR-XR) 75 MG 24 hr capsule Take 1 capsule (75 mg total) by mouth daily with breakfast. For depression Patient not taking: Reported on 07/06/2022 04/15/22   Lindell Spar I, NP      Allergies    Wellbutrin [bupropion], Omnipaque [iohexol], Penicillins, Atarax [hydroxyzine], Contrast media [iodinated contrast media], and Cymbalta [duloxetine hcl]    Review of Systems   Review of Systems  Physical Exam Updated Vital Signs BP (!) 124/99   Pulse (!) 108   Temp 98.2 F (36.8 C)   Resp 20   Ht 5\' 7"  (1.702 m)   Wt 92 kg   LMP 05/06/2022 (Approximate)    SpO2 97%   BMI 31.77 kg/m  Physical Exam Vitals and nursing note reviewed.  Constitutional:      General: She is not in acute distress.    Appearance: She is well-developed.     Comments: Conversant.  Does not appear to be responding to internal stimuli.  HENT:     Head: Normocephalic and atraumatic.     Right Ear: External ear normal.     Left Ear: External ear normal.     Mouth/Throat:     Mouth: Mucous membranes are moist.     Pharynx: Oropharynx is clear.  Eyes:     Extraocular Movements: Extraocular movements intact.     Conjunctiva/sclera: Conjunctivae normal.     Pupils: Pupils are equal, round, and reactive to light.  Cardiovascular:     Rate and Rhythm: Normal rate and regular rhythm.     Heart sounds: No murmur heard. Pulmonary:     Effort: Pulmonary effort is normal. No respiratory distress.     Breath sounds: Normal breath sounds.  Abdominal:     General: There is no distension.     Palpations: Abdomen is soft. There is no mass.     Tenderness: There is no abdominal tenderness. There is no guarding.  Musculoskeletal:        General: No swelling.     Cervical back: Neck supple.  Skin:    General: Skin is warm and dry.     Capillary Refill: Capillary refill takes less than 2 seconds.  Neurological:     Mental Status: She is alert.  Psychiatric:        Mood and Affect: Mood normal.     ED Results / Procedures / Treatments   Labs (all labs ordered are listed, but only abnormal results are displayed) Labs Reviewed  COMPREHENSIVE METABOLIC PANEL - Abnormal; Notable for the following components:      Result Value   Glucose, Bld 193 (*)    BUN <5 (*)    All other components within normal limits  CBC WITH DIFFERENTIAL/PLATELET - Abnormal; Notable for the following components:   Hemoglobin 11.4 (*)    MCV 76.9 (*)    MCH 23.8 (*)    RDW 16.5 (*)    All other components within normal limits  ACETAMINOPHEN LEVEL - Abnormal; Notable for the following  components:   Acetaminophen (Tylenol), Serum <10 (*)    All other components within normal limits  SALICYLATE LEVEL - Abnormal; Notable for the following components:   Salicylate Lvl Q000111Q (*)    All other components within normal limits  URINALYSIS, ROUTINE W REFLEX MICROSCOPIC - Abnormal; Notable for the following components:   APPearance CLOUDY (*)    Specific Gravity, Urine >1.030 (*)    Glucose, UA 100 (*)    Bilirubin  Urine SMALL (*)    Protein, ur 100 (*)    All other components within normal limits  LITHIUM LEVEL - Abnormal; Notable for the following components:   Lithium Lvl 0.06 (*)    All other components within normal limits  URINALYSIS, MICROSCOPIC (REFLEX) - Abnormal; Notable for the following components:   Bacteria, UA MANY (*)    All other components within normal limits  WET PREP, GENITAL  URINE CULTURE  ETHANOL  RAPID URINE DRUG SCREEN, HOSP PERFORMED  HIV ANTIBODY (ROUTINE TESTING W REFLEX)  RPR  I-STAT BETA HCG BLOOD, ED ([facility], [facility], [facility] ONLY)  GC/CHLAMYDIA PROBE AMP ([facility]) NOT AT [facility]    EKG None  Radiology No results found.  Procedures Procedures   Medications Ordered in ED Medications  nitrofurantoin (macrocrystal-monohydrate) (MACROBID) capsule 100 mg (100 mg Oral Given 07/16/22 2138)  insulin aspart (novoLOG) injection 0-15 Units (has no administration in time range)    ED Course/ Medical Decision Making/ A&P                             Medical Decision Making Amount and/or Complexity of Data Reviewed Labs: ordered.  Risk Prescription drug management.   [name] is a 32 y.o. female with comorbidities that complicate the patient evaluation including schizoaffective disorder, bipolar disorder, anxiety, and multiple suicide attempts who presents to the emergency department with complaints of seeing demons and suicidal ideations.  Also complaining of dysuria and vaginal irritation.  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  Will obtain urine drug screen and ethanol to ensure that there are no drugs that could be contributing.  Does have suicidal ideations and was reporting considering taking her lithium.  Will check a lithium level along with her psych clearance labs at this time.  Will also check for urinary tract infection and STIs with her vaginal irritation and dysuria.  Fever or significant pelvic pain that would be concerning for PID.  Plan:  Labs Wet prep GC chlamydia RPR HIV Urine drug screen Psych consult  ED Summary/Re-evaluation:  Patient underwent the above workup and was found to have urinary tract infection and was started on Macrobid.  Wet prep was negative.  Awaiting GC and chlamydia.  She is cleared for psychiatry evaluation at this time.  IVC paperwork has been filled out. Home medication reconciliation is ordered and will need to be ordered once completed.  Did put patient in for sliding scale insulin in the meantime.  Will also need to follow-up on GC and chlamydia for the patient.  This patient presents to the ED for concern of complaints listed in HPI, this involves an extensive number of treatment options, and is a complaint that carries with it a high risk of complications and morbidity. Disposition including potential need for admission considered.   Dispo: Pending remainder of workup  Records reviewed Outpatient Clinic Notes The following labs were independently interpreted: Urinalysis and show urinary tract infection I personally reviewed and interpreted the pt's EKG: see above for interpretation  I have reviewed the patients home medications and made adjustments as needed Consults: TTS   Final Clinical Impression(s) / ED Diagnoses Final diagnoses:  Suicidal ideation  Hallucinations  Acute cystitis without hematuria    Rx / DC Orders ED Discharge Orders     None  Fransico Meadow, MD 07/17/22 [phone]

## 2022-07-16 NOTE — ED Notes (Signed)
[facility] paperwork completed original in the red folder faxed to [facility]  3copies maded and attached to the clipboard in orange secretary Aryah is aware.

## 2022-07-16 NOTE — ED Notes (Signed)
TTS consult in progress. °

## 2022-07-16 NOTE — BH Assessment (Incomplete)
Comprehensive Clinical Assessment (CCA) Note  07/16/2022 [name] DH:[id number]  Disposition: [name], NP recommends continuous assessment, to be reassessed tomorrow. RN Newman Pies and Dr. Margaretmary Eddy notified of the recommendation.  The patient demonstrates the following risk factors for suicide: Chronic risk factors for suicide include: psychiatric disorder of schizoaffective disorder, bipolar and major depressive disorder . Acute risk factors for suicide include: family or marital conflict. Protective factors for this patient include: hope for the future. Considering these factors, the overall suicide risk at this point appears to be high. Patient is not appropriate for outpatient follow up.  [name] is a 32 y.o. single female who presents to [name] due talking about demons and suicidal ideation with a plan to overdose on her medication. Per chart, Pt has a history of bipolar disorder, schizoaffective disorder, and acute psychosis suicidal ideations. Pt states she saw a voodoo doll in the bathroom of [name] two months ago and that she has not seen a demon in a year. Pt report "I feel demonic spirits and it feels terrible. It's scary and uncomfortable." Pt has multiple inpatient hospitalizations in the past, with the most recent being at [facility]. Pt denies current HI, auditory or visual hallucinations.  Chief Complaint:  Chief Complaint  Patient presents with  . Hallucinations  . Suicidal   Visit Diagnosis: Major depressive disorder    CCA Screening, Triage and Referral (STR)  Patient Reported Information How did you hear about [place]? Other (Comment) (ACT team)  What Is the Reason for Your Visit/Call Today? Pt reports she was with her ACT team, when she started talking about a demons and withcraft. Patient said demons are real and she saw a voodo doll in the bathroom at [facility] two months ago. Pt endorses SI, with a plan to overdose on her  medications. Pt denies SI, HI, auditory or visual hallucinations.  How Long Has This Been Causing You Problems? > than 6 months  What Do You Feel Would Help You the Most Today? Treatment for Depression or other mood problem   Have You Recently Had Any Thoughts About Hurting Yourself? Yes  Are You Planning to Commit Suicide/Harm Yourself At This time? Yes   [name] ED from 07/16/2022 in [facility] Emergency Department at [facility] ED from 07/09/2022 in [facility] Emergency Department at [facility] ED from 07/05/2022 in [facility] Emergency Department at [place] High Risk High Risk High Risk       Have you Recently Had Thoughts About [name]? No  Are You Planning to Harm Someone at This Time? No  Explanation: N/A   Have You Used Any Alcohol or Drugs in the Past 24 Hours? No  What Did You Use and How Much? N/A   Do You Currently Have a Therapist/Psychiatrist? Yes  Name of Therapist/Psychiatrist: Name of Therapist/Psychiatrist: ACT team   Have You Been Recently Discharged From Any Office Practice or Programs? Yes  Explanation of Discharge From Practice/Program: from ED on 07/09/22     CCA Screening Triage Referral Assessment Type of Contact: Tele-Assessment  Telemedicine Service Delivery: Telemedicine service delivery: This service was provided via telemedicine using a 2-way, interactive audio and video technology  Is this Initial or Reassessment? Is this Initial or Reassessment?: Initial Assessment  Date Telepsych consult ordered in [facility]:  Date Telepsych consult ordered in [facility]: 07/16/22  Time Telepsych consult ordered in [facility]:  Time Telepsych consult  ordered in [facility]: 1815  Location of Assessment: [facility] ED  Provider Location: [facility] Assessment Services   Collateral Involvement: Annie Main, ACT team lead   Does Patient Have a Court Appointed Legal Guardian? No  Legal Guardian Contact Information:  N/A  Copy of Legal Guardianship Form: -- (N/A)  Legal Guardian Notified of Arrival: -- (N/A)  Legal Guardian Notified of Pending Discharge: -- (N/A)  If Minor and Not Living with Parent(s), Who has Custody? N/A  Is CPS involved or ever been involved? Never  Is APS involved or ever been involved? Never   Patient Determined To Be At Risk for Harm To Self or Others Based on Review of Patient Reported Information or Presenting Complaint? Yes, for Self-Harm (Denies HI)  Method: Plan without intent (Denies HI)  Availability of Means: Has close by (Denies HI)  Intent: Clearly intends on inflicting harm that could cause death (Denies HI)  Notification Required: No need or identified person  Additional Information for Danger to Others Potential: -- (N/A)  Additional Comments for Danger to Others Potential: N/A  Are There Guns or Other Weapons in Your Home? No  Types of Guns/Weapons: N/A  Are These Weapons Safely Secured?                            -- (N/A)  Who Could Verify You Are Able To Have These Secured: N/A  Do You Have any Outstanding Charges, Pending Court Dates, Parole/Probation? No  Contacted To Inform of Risk of Harm To Self or Others: -- (N/A)    Does Patient Present under Involuntary Commitment? -- (N/A)    County of Residence: [place]   Patient Currently Receiving the Following Services: ACTT Architect)   Determination of Need: Emergent (2 hours)   Options For Referral: Inpatient Hospitalization; Outpatient Therapy; Medication Management     CCA Biopsychosocial Patient Reported Schizophrenia/Schizoaffective Diagnosis in Past: Yes   Strengths: Pt has an ACT team   Mental Health Symptoms Depression:   Sleep (too much or little)   Duration of Depressive symptoms:  Duration of Depressive Symptoms: Greater than two weeks   Mania:   None   Anxiety:    None   Psychosis:   None   Duration of Psychotic symptoms:   Duration of Psychotic Symptoms: N/A   Trauma:   None   Obsessions:   None   Compulsions:   None   Inattention:   None   Hyperactivity/Impulsivity:   None   Oppositional/Defiant Behaviors:   None   Emotional Irregularity:   None   Other Mood/Personality Symptoms:   N/A    Mental Status Exam Appearance and self-care  Stature:   Average   Weight:   Obese   Clothing:   -- (Scrubs)   Grooming:   Normal   Cosmetic use:   None   Posture/gait:   Normal   Motor activity:   Not Remarkable   Sensorium  Attention:   Normal   Concentration:   Normal   Orientation:   X5   Recall/memory:   Normal   Affect and Mood  Affect:   Flat   Mood:   Depressed   Relating  Eye contact:   Normal   Facial expression:   Responsive   Attitude toward examiner:   Cooperative   Thought and Language  Speech flow:  Normal   Thought content:   Appropriate to Mood and Circumstances   Preoccupation:  None   Hallucinations:   None   Organization:   Linear   Transport planner of Knowledge:   Average   Intelligence:   Average   Abstraction:   Functional   Judgement:   Fair   Art therapist:   Distorted   Insight:   Lacking   Decision Making:   Impulsive   Social Functioning  Social Maturity:   Impulsive   Social Judgement:   Normal   Stress  Stressors:   Family conflict   Coping Ability:   Programme researcher, broadcasting/film/video Deficits:   None   Supports:   Friends/Service system     Religion: Religion/Spirituality Are You A Religious Person?: No How Might This Affect Treatment?: N/A  Leisure/Recreation: Leisure / Recreation Do You Have Hobbies?: No Leisure and Hobbies: N/A  Exercise/Diet: Exercise/Diet Have You Gained or Lost A Significant Amount of Weight in the Past Six Months?: No Do You Follow a Special Diet?: No Do You Have Any Trouble Sleeping?: Yes Explanation of Sleeping Difficulties: Pt reports her  sleep is poor   CCA Employment/Education Employment/Work Situation: Employment / Work Technical sales engineer: On disability Why is Patient on Disability: Pt states for a learning disability How Long has Patient Been on Disability: Since age 89 Patient's Job has Been Impacted by Current Illness: No Has Patient ever Been in the [employer]?: No  Education: Education Is Patient Currently Attending School?: No Last Grade Completed: 11 Did You Attend College?: No Did You Have An Individualized Education Program (IIEP): No Did You Have Any Difficulty At School?: No Were Any Medications Ever Prescribed For These Difficulties?: No Patient's Education Has Been Impacted by Current Illness: No How Does Current Illness Impact Education?: N/A   CCA Family/Childhood History Family and Relationship History: Family history Marital status: Single Long term relationship, how long?: N/A What types of issues is patient dealing with in the relationship?: N/A Additional relationship information: N/A Does patient have children?: Yes How many children?: 3 How is patient's relationship with their children?: Pt reports she does not talk to her children  Childhood History:  Childhood History By whom was/is the patient raised?: Mother Description of patient's current relationship with siblings: N/A Did patient suffer any verbal/emotional/physical/sexual abuse as a child?: No Did patient suffer from severe childhood neglect?: No Has patient ever been sexually abused/assaulted/raped as an adolescent or adult?: No Was the patient ever a victim of a crime or a disaster?: No Witnessed domestic violence?: No Has patient been affected by domestic violence as an adult?: No Description of domestic violence: N/A       CCA Substance Use Alcohol/Drug Use: Alcohol / Drug Use Pain Medications: See MAR Prescriptions: See MAR Over the Counter: See MAR History of alcohol / drug use?: No history of  alcohol / drug abuse Longest period of sobriety (when/how long): N/A Negative Consequences of Use:  (N/A) Withdrawal Symptoms:  (N/A)                         ASAM's:  Six Dimensions of Multidimensional Assessment  Dimension 1:  Acute Intoxication and/or Withdrawal Potential:      Dimension 2:  Biomedical Conditions and Complications:      Dimension 3:  Emotional, Behavioral, or Cognitive Conditions and Complications:     Dimension 4:  Readiness to Change:     Dimension 5:  Relapse, Continued use, or Continued Problem Potential:     Dimension 6:  Recovery/Living Environment:  ASAM Severity Score:    ASAM Recommended Level of Treatment:     Substance use Disorder (SUD)    Recommendations for Services/Supports/Treatments:    Discharge Disposition:    DSM5 Diagnoses: Patient Active Problem List   Diagnosis Date Noted  . Suicidal ideations 01/04/2022  . Suicidal overdose ([place]) 03/23/2021  . Syphilis 07/15/2020  . Malingering 06/05/2020  . Gastroesophageal reflux disease 05/04/2020  . Hyperglycemia due to type 2 diabetes mellitus ([place]) 05/04/2020  . Long term (current) use of insulin ([place]) 05/04/2020  . Migraine without aura 05/04/2020  . Morbid obesity ([place]) 05/04/2020  . 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 2 diabetes mellitus ([place]) 05/04/2020  . Prolapsed internal hemorrhoids 05/04/2020  . Vitamin D deficiency 05/04/2020  . Other symptoms and signs involving cognitive functions and awareness 05/04/2020  . Suicide attempt ([place])   . Anxiety state 03/06/2020  . Schizophrenia ([place]) 09/13/2019  . Bipolar I disorder, most recent episode depressed ([place]) 06/23/2019  . MDD (major depressive disorder) 10/10/2018  . Schizoaffective disorder, bipolar type ([place]) 09/25/2018  . Affective psychosis, bipolar ([place]) 06/13/2018  . HTN (hypertension) 05/03/2018  . Tobacco use disorder 05/03/2018  . Adjustment disorder with emotional disturbance 01/02/2018  . 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 Moderate bipolar I disorder, most recent episode depressed ([place])   . Schizoaffective disorder ([place])   . 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 OCD (obsessive compulsive disorder) 10/05/2016  . Major depressive disorder, recurrent episode, mild ([place]) 05/04/2016  . 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 Learning disability 07/18/2015  . 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 Diabetes mellitus ([place]) 07/18/2015  . 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 Hyperlipidemia 07/18/2015  . Severe episode of recurrent major depressive disorder, without psychotic features ([place])   . Suicidal ideation   . Intentional overdose ([place])   . Cognitive deficits 10/12/2012  . Generalized anxiety disorder 06/28/2012     Referrals to Alternative Service(s): Referred to Alternative Service(s):   Place:   Date:   Time:    Referred to Alternative Service(s):   Place:   Date:   Time:    Referred to Alternative Service(s):   Place:   Date:   Time:    Referred to Alternative Service(s):   Place:   Date:   Time:     Waylan Boga, LCSW

## 2022-07-16 NOTE — BH Assessment (Signed)
Comprehensive Clinical Assessment (CCA) Note  07/17/2022 [name] [id number]  Disposition: [name], NP recommends continuous assessment, to be reassessed tomorrow. RN Newman Pies and Dr. Margaretmary Eddy notified of the recommendation.  The patient demonstrates the following risk factors for suicide: Chronic risk factors for suicide include: psychiatric disorder of schizoaffective disorder, bipolar and major depressive disorder . Acute risk factors for suicide include: family or marital conflict. Protective factors for this patient include: hope for the future. Considering these factors, the overall suicide risk at this point appears to be high. Patient is not appropriate for outpatient follow up.  [name] is a 32 y.o. single female who presents to [name] due talking about demons and suicidal ideation with a plan to overdose on her medication. Pt was with her ACT team today when she started talking about witchcraft. Per chart, Pt has a history of bipolar disorder, schizoaffective disorder, and acute psychosis suicidal ideations. Pt states she saw a voodoo doll in the bathroom of [name] two months ago and that she has not seen a demon in a year. Pt report "I feel demonic spirits and it feels terrible. It's scary and uncomfortable." Pt has multiple inpatient hospitalizations in the past, with the most recent being at [facility]. Pt denies current HI, auditory or visual hallucinations.  Pt identifies her primary stressor as not being able to go to her mother's house. She states "she said its demons around there and she doesn't want to catch germs." Pt lives alone and receives disability. Pt identifies her ACT team as her primary support.   Pt was alert and oriented with normal speech. Pt had a flat affect and good eye contact. There was no indication Pt was responding to internal stimuli. Pt was cooperative throughout the assessment.  Pt provided permission  for her ACT team lead Annie Main [phone] to be contacted for collateral. Annie Main states he was at patient's home and she seems anxious and fidgety, when she is typically bubbly. He says Pt reported she had a lot on her mind so he offered to take her to the office. Annie Main repots Pt started talking about demons, witchcraft and continued to be restless. He says the behaviors were different from her baseline behaviors. Annie Main states Pt said "I just want to die" and said she needed to go to the hospital due to not feeling right. He contacted BHRT and had patient transported. Annie Main expressed he believes Pt may have an UTI, considering her behaviors were outside of her baseline.   Chief Complaint:  Chief Complaint  Patient presents with   Hallucinations   Suicidal   Visit Diagnosis: Major depressive disorder    CCA Screening, Triage and Referral (STR)  Patient Reported Information How did you hear about [place]? Other (Comment) (ACT team)  What Is the Reason for Your Visit/Call Today? Pt reports she was with her ACT team, when she started talking about a demons and withcraft. Patient said demons are real and she saw a voodo doll in the bathroom at [facility] two months ago. Pt endorses SI, with a plan to overdose on her medications. Pt denies SI, HI, auditory or visual hallucinations.  How Long Has This Been Causing You Problems? > than 6 months  What Do You Feel Would Help You the Most Today? Treatment for Depression or other mood problem   Have You Recently Had Any Thoughts About Hurting Yourself? Yes  Are You Planning to Commit Suicide/Harm Yourself At This time? Yes  [facility] from 07/16/2022 in [facility] Emergency Department at [facility] ED from 07/09/2022 in [facility] Emergency Department at [facility] ED from 07/05/2022 in [facility] Emergency Department at [place] High Risk High Risk High Risk       Have you Recently Had  Thoughts About [place]? No  Are You Planning to Harm Someone at This Time? No  Explanation: N/A   Have You Used Any Alcohol or Drugs in the Past 24 Hours? No  What Did You Use and How Much? N/A   Do You Currently Have a Therapist/Psychiatrist? Yes  Name of Therapist/Psychiatrist: Name of Therapist/Psychiatrist: ACT team   Have You Been Recently Discharged From Any Office Practice or Programs? Yes  Explanation of Discharge From Practice/Program: from ED on 07/09/22     CCA Screening Triage Referral Assessment Type of Contact: Tele-Assessment  Telemedicine Service Delivery: Telemedicine service delivery: This service was provided via telemedicine using a 2-way, interactive audio and video technology  Is this Initial or Reassessment? Is this Initial or Reassessment?: Initial Assessment  Date Telepsych consult ordered in [facility]:  Date Telepsych consult ordered in CHL: 07/16/22  Time Telepsych consult ordered in CHL:  Time Telepsych consult ordered in CHL: 1815  Location of Assessment: [facility] ED  Provider Location: [facility] Assessment Services   Collateral Involvement: Annie Main, ACT team lead   Does Patient Have a [place]? No  Legal Guardian Contact Information: N/A  Copy of Legal Guardianship Form: -- (N/A)  Legal Guardian Notified of Arrival: -- (N/A)  Legal Guardian Notified of Pending Discharge: -- (N/A)  If Minor and Not Living with Parent(s), Who has Custody? N/A  Is CPS involved or ever been involved? Never  Is APS involved or ever been involved? Never   Patient Determined To Be At Risk for Harm To Self or Others Based on Review of Patient Reported Information or Presenting Complaint? Yes, for Self-Harm (Denies HI)  Method: Plan without intent (Denies HI)  Availability of Means: Has close by (Denies HI)  Intent: Clearly intends on inflicting harm that could cause death (Denies HI)  Notification Required: No need or  identified person  Additional Information for Danger to Others Potential: -- (N/A)  Additional Comments for Danger to Others Potential: N/A  Are There Guns or Other Weapons in Your Home? No  Types of Guns/Weapons: N/A  Are These Weapons Safely Secured?                            -- (N/A)  Who Could Verify You Are Able To Have These Secured: N/A  Do You Have any Outstanding Charges, Pending Court Dates, Parole/Probation? No  Contacted To Inform of Risk of Harm To Self or Others: -- (N/A)    Does Patient Present under Involuntary Commitment? -- (N/A)    County of Residence: [place]   Patient Currently Receiving the Following Services: ACTT Architect)   Determination of Need: Emergent (2 hours)   Options For Referral: Inpatient Hospitalization; Outpatient Therapy; Medication Management     CCA Biopsychosocial Patient Reported Schizophrenia/Schizoaffective Diagnosis in Past: Yes   Strengths: Pt has an ACT team   Mental Health Symptoms Depression:   Sleep (too much or little)   Duration of Depressive symptoms:  Duration of Depressive Symptoms: Greater than two weeks   Mania:   None   Anxiety:  None   Psychosis:   None   Duration of Psychotic symptoms:  Duration of Psychotic Symptoms: N/A   Trauma:   None   Obsessions:   None   Compulsions:   None   Inattention:   None   Hyperactivity/Impulsivity:   None   Oppositional/Defiant Behaviors:   None   Emotional Irregularity:   None   Other Mood/Personality Symptoms:   N/A    Mental Status Exam Appearance and self-care  Stature:   Average   Weight:   Obese   Clothing:   -- (Scrubs)   Grooming:   Normal   Cosmetic use:   None   Posture/gait:   Normal   Motor activity:   Not Remarkable   Sensorium  Attention:   Normal   Concentration:   Normal   Orientation:   X5   Recall/memory:   Normal   Affect and Mood  Affect:   Flat   Mood:    Depressed   Relating  Eye contact:   Normal   Facial expression:   Responsive   Attitude toward examiner:   Cooperative   Thought and Language  Speech flow:  Normal   Thought content:   Appropriate to Mood and Circumstances   Preoccupation:   None   Hallucinations:   None   Organization:   Linear   Transport planner of Knowledge:   Average   Intelligence:   Average   Abstraction:   Functional   Judgement:   Fair   Art therapist:   Distorted   Insight:   Lacking   Decision Making:   Impulsive   Social Functioning  Social Maturity:   Impulsive   Social Judgement:   Normal   Stress  Stressors:   Family conflict   Coping Ability:   Programme researcher, broadcasting/film/video Deficits:   None   Supports:   Friends/Service system     Religion: Religion/Spirituality Are You A Religious Person?: No How Might This Affect Treatment?: N/A  Leisure/Recreation: Leisure / Recreation Do You Have Hobbies?: No Leisure and Hobbies: N/A  Exercise/Diet: Exercise/Diet Have You Gained or Lost A Significant Amount of Weight in the Past Six Months?: No Do You Follow a Special Diet?: No Do You Have Any Trouble Sleeping?: Yes Explanation of Sleeping Difficulties: Pt reports her sleep is poor   CCA Employment/Education Employment/Work Situation: Employment / Work Technical sales engineer: On disability Why is Patient on Disability: Pt states for a learning disability How Long has Patient Been on Disability: Since age 89 Patient's Job has Been Impacted by Current Illness: No Has Patient ever Been in the [employer]?: No  Education: Education Is Patient Currently Attending School?: No Last Grade Completed: 11 Did You Attend College?: No Did You Have An Individualized Education Program (IIEP): No Did You Have Any Difficulty At School?: No Were Any Medications Ever Prescribed For These Difficulties?: No Patient's Education Has Been Impacted by  Current Illness: No How Does Current Illness Impact Education?: N/A   CCA Family/Childhood History Family and Relationship History: Family history Marital status: Single Long term relationship, how long?: N/A What types of issues is patient dealing with in the relationship?: N/A Additional relationship information: N/A Does patient have children?: Yes How many children?: 3 How is patient's relationship with their children?: Pt reports she does not talk to her children  Childhood History:  Childhood History By whom was/is the patient raised?: Mother Description of patient's current relationship with siblings: N/A Did patient suffer  any verbal/emotional/physical/sexual abuse as a child?: No Did patient suffer from severe childhood neglect?: No Has patient ever been sexually abused/assaulted/raped as an adolescent or adult?: No Was the patient ever a victim of a crime or a disaster?: No Witnessed domestic violence?: No Has patient been affected by domestic violence as an adult?: No Description of domestic violence: N/A       CCA Substance Use Alcohol/Drug Use: Alcohol / Drug Use Pain Medications: See MAR Prescriptions: See MAR Over the Counter: See MAR History of alcohol / drug use?: No history of alcohol / drug abuse Longest period of sobriety (when/how long): N/A Negative Consequences of Use:  (N/A) Withdrawal Symptoms:  (N/A)                         ASAM's:  Six Dimensions of Multidimensional Assessment  Dimension 1:  Acute Intoxication and/or Withdrawal Potential:      Dimension 2:  Biomedical Conditions and Complications:      Dimension 3:  Emotional, Behavioral, or Cognitive Conditions and Complications:     Dimension 4:  Readiness to Change:     Dimension 5:  Relapse, Continued use, or Continued Problem Potential:     Dimension 6:  Recovery/Living Environment:     ASAM Severity Score:    ASAM Recommended Level of Treatment:     Substance use  Disorder (SUD)    Recommendations for Services/Supports/Treatments:    Discharge Disposition:    DSM5 Diagnoses: Patient Active Problem List   Diagnosis Date Noted   Suicidal ideations 01/04/2022   Suicidal overdose ([place]) 03/23/2021   Syphilis 07/15/2020   Malingering 06/05/2020   Gastroesophageal reflux disease 05/04/2020   Hyperglycemia due to type 2 diabetes mellitus ([place]) 05/04/2020   Long term (current) use of insulin ([place]) 05/04/2020   Migraine without aura 05/04/2020   Morbid obesity ([place]) 05/04/2020   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 2 diabetes mellitus ([place]) 05/04/2020   Prolapsed internal hemorrhoids 05/04/2020   Vitamin D deficiency 05/04/2020   Other symptoms and signs involving cognitive functions and awareness 05/04/2020   Suicide attempt ([place])    Anxiety state 03/06/2020   Schizophrenia ([place]) 09/13/2019   Bipolar I disorder, most recent episode depressed ([place]) 06/23/2019   MDD (major depressive disorder) 10/10/2018   Schizoaffective disorder, bipolar type ([place]) 09/25/2018   Affective psychosis, bipolar ([place]) 06/13/2018   HTN (hypertension) 05/03/2018   Tobacco use disorder 05/03/2018   Adjustment disorder with emotional disturbance 01/02/2018   Schizophrenia, 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   Moderate bipolar I disorder, most recent episode depressed ([place])    Schizoaffective disorder ([place])    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/2017   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   OCD (obsessive compulsive disorder) 10/05/2016   Major depressive disorder, recurrent episode, mild ([place]) 05/04/2016   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   Learning disability 07/18/2015   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/2015   Diabetes mellitus ([place]) 07/18/2015   MDD (major depressive disorder), recurrent, severe, 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

## 2022-07-16 NOTE — ED Notes (Signed)
Ivc paperwork now in process

## 2022-07-16 NOTE — ED Triage Notes (Signed)
Pt was brought in by the police and ACT team person for auditory and visual hallucinations. Pt told them she is hearing demons. Pt c/o dysuria and vaginal itchingx4-5d. Pt states she has thrush in her mouthx1wk

## 2022-07-17 DIAGNOSIS — R45851 Suicidal ideations: Secondary | ICD-10-CM | POA: Diagnosis not present

## 2022-07-17 LAB — CBG MONITORING, ED
Glucose-Capillary: 227 mg/dL — ABNORMAL HIGH (ref 70–99)
Glucose-Capillary: 266 mg/dL — ABNORMAL HIGH (ref 70–99)
Glucose-Capillary: 225 mg/dL — ABNORMAL HIGH (ref 70–99)
Glucose-Capillary: 244 mg/dL — ABNORMAL HIGH (ref 70–99)

## 2022-07-17 LAB — RESP PANEL BY RT-PCR (RSV, FLU A&B, COVID)  RVPGX2
Influenza A by PCR: NEGATIVE
Influenza B by PCR: NEGATIVE
Resp Syncytial Virus by PCR: NEGATIVE
SARS Coronavirus 2 by RT PCR: NEGATIVE

## 2022-07-17 LAB — GC/CHLAMYDIA PROBE AMP (~~LOC~~) NOT AT ARMC
Chlamydia: NEGATIVE
Comment: NEGATIVE
Comment: NORMAL
Neisseria Gonorrhea: NEGATIVE

## 2022-07-17 LAB — RPR: RPR Ser Ql: NONREACTIVE

## 2022-07-17 MED ORDER — LITHIUM CARBONATE ER 450 MG PO TBCR
450.0000 mg | EXTENDED_RELEASE_TABLET | Freq: Every day | ORAL | Status: DC
  Administered 2022-07-17: 450 mg via ORAL
  Filled 2022-07-17: qty 1

## 2022-07-17 MED ORDER — METOPROLOL TARTRATE 25 MG PO TABS
25.0000 mg | ORAL_TABLET | Freq: Two times a day (BID) | ORAL | Status: DC
  Administered 2022-07-17 (×2): 25 mg via ORAL
  Filled 2022-07-17 (×2): qty 1

## 2022-07-17 MED ORDER — SIMVASTATIN 20 MG PO TABS
40.0000 mg | ORAL_TABLET | Freq: Every day | ORAL | Status: DC
  Administered 2022-07-17: 40 mg via ORAL
  Filled 2022-07-17: qty 2

## 2022-07-17 MED ORDER — STERILE WATER FOR INJECTION IJ SOLN
INTRAMUSCULAR | Status: AC
  Administered 2022-07-17: 1.2 mL
  Filled 2022-07-17: qty 10

## 2022-07-17 MED ORDER — VENLAFAXINE HCL ER 150 MG PO CP24: 150.0000 mg | ORAL_CAPSULE | Freq: Every day | ORAL | Status: DC

## 2022-07-17 MED ORDER — DIPHENHYDRAMINE HCL 50 MG/ML IJ SOLN: 50.0000 mg | Freq: Once | INTRAMUSCULAR | Status: DC

## 2022-07-17 MED ORDER — AMLODIPINE BESYLATE 5 MG PO TABS
10.0000 mg | ORAL_TABLET | Freq: Every day | ORAL | Status: DC
  Administered 2022-07-17: 10 mg via ORAL
  Filled 2022-07-17: qty 2

## 2022-07-17 MED ORDER — GABAPENTIN 100 MG PO CAPS
100.0000 mg | ORAL_CAPSULE | Freq: Three times a day (TID) | ORAL | Status: DC
  Administered 2022-07-17 (×3): 100 mg via ORAL
  Filled 2022-07-17 (×3): qty 1

## 2022-07-17 MED ORDER — DICLOFENAC SODIUM 1 % EX GEL
2.0000 g | Freq: Four times a day (QID) | CUTANEOUS | Status: DC
  Filled 2022-07-17: qty 100

## 2022-07-17 MED ORDER — HALOPERIDOL 5 MG PO TABS
10.0000 mg | ORAL_TABLET | Freq: Every day | ORAL | Status: DC
  Administered 2022-07-17: 10 mg via ORAL
  Filled 2022-07-17: qty 2

## 2022-07-17 MED ORDER — ZIPRASIDONE MESYLATE 20 MG IM SOLR
20.0000 mg | INTRAMUSCULAR | Status: AC | PRN
  Administered 2022-07-17: 20 mg via INTRAMUSCULAR
  Filled 2022-07-17: qty 20

## 2022-07-17 MED ORDER — LORAZEPAM 1 MG PO TABS
1.0000 mg | ORAL_TABLET | ORAL | Status: DC | PRN
  Filled 2022-07-17: qty 1

## 2022-07-17 MED ORDER — VITAMIN D 25 MCG (1000 UNIT) PO TABS
1000.0000 [IU] | ORAL_TABLET | Freq: Every day | ORAL | Status: DC
  Administered 2022-07-17: 1000 [IU] via ORAL
  Filled 2022-07-17: qty 1

## 2022-07-17 MED ORDER — TRAZODONE HCL 50 MG PO TABS
50.0000 mg | ORAL_TABLET | Freq: Every evening | ORAL | Status: DC | PRN
  Administered 2022-07-17: 50 mg via ORAL
  Filled 2022-07-17: qty 1

## 2022-07-17 MED ORDER — OLANZAPINE 5 MG PO TBDP: 10.0000 mg | ORAL_TABLET | Freq: Two times a day (BID) | ORAL | Status: DC | PRN

## 2022-07-17 MED ORDER — ONDANSETRON 4 MG PO TBDP
4.0000 mg | ORAL_TABLET | Freq: Three times a day (TID) | ORAL | Status: DC | PRN
  Administered 2022-07-17: 4 mg via ORAL
  Filled 2022-07-17: qty 1

## 2022-07-17 MED ORDER — CLOMIPRAMINE HCL 25 MG PO CAPS
25.0000 mg | ORAL_CAPSULE | Freq: Every day | ORAL | Status: DC
  Administered 2022-07-17: 25 mg via ORAL
  Filled 2022-07-17 (×2): qty 1

## 2022-07-17 MED ORDER — BENZTROPINE MESYLATE 1 MG PO TABS
0.5000 mg | ORAL_TABLET | Freq: Two times a day (BID) | ORAL | Status: DC
  Administered 2022-07-17 (×2): 0.5 mg via ORAL
  Filled 2022-07-17 (×2): qty 1

## 2022-07-17 MED ORDER — OLANZAPINE 5 MG PO TBDP
10.0000 mg | ORAL_TABLET | Freq: Two times a day (BID) | ORAL | Status: DC | PRN
  Filled 2022-07-17: qty 2

## 2022-07-17 MED ORDER — LORAZEPAM 1 MG PO TABS
1.0000 mg | ORAL_TABLET | Freq: Four times a day (QID) | ORAL | Status: DC | PRN
  Administered 2022-07-17: 1 mg via ORAL
  Filled 2022-07-17: qty 1

## 2022-07-17 NOTE — Progress Notes (Signed)
LCSW Progress Note  [id number]   Parnell Elder  07/17/2022  9:56 AM  Description:   Inpatient Psychiatric Referral  Patient was recommended inpatient per Darrol Angel, NP. There are no available beds at [facility]. Patient was referred to the following facilities:   Destination  Service Provider Address Phone Fax  [facility]  [address] [place] [address] [phone]  [facility]  [address], [place] [phone] [place]  [place], [place] [address] [phone]  [facility]  [address] [place] [place] [address] [phone] [facility]  [address] [place], [place] [address] [phone] [address] [place] [address] [place]  [facility]  [address] [place] [address] [phone]  [facility]  [address], [place] [address] [phone]  [place] [address] [place] [address] [id number]  [facility]  [address] [place] [address] [phone] [place]  [address] [place] [address] [phone] [facility]  [address], [place] [address] [phone] [facility]  [address] [place] [address] [place]  [address], [place] [address] [phone] [facility]  [address] [place] [address] [phone] [facility]  [address], [place] [address] [phone]  [facility]  [address] [place], [place] [address] [phone]  [facility]  [address], [place] [address] [phone] [place]  [place] [address] [phone]  [address] [place] [address] [id number]  [facility]  [address] [place] [address] [phone] [place]  [address] [place] [address] [phone]  [facility] [place]  [place], [place] [address] [phone] [place] [place] [address] [phone]  [facility]  [place], [place]  [address] [phone] [facility]  [address] [place] [address] [phone] [facility]  [address], [place] [address] [phone]    Situation ongoing, CSW to continue following and update chart as more information becomes available.      [name]  07/17/2022 9:56 AM

## 2022-07-17 NOTE — Progress Notes (Signed)
Pt was accepted to [facility] 07/17/2022  Pt meets inpatient criteria per Darrol Angel, NP  Attending Physician will be Walden Field, NP  Report can be called to: [phone]  Pt can arrive AFTER 8 PM  Care Team Notified: Darrol Angel, NP, Sammuel Bailiff, RN, and Merlyn Lot, NP  Denna Haggard, LCSWA  07/17/2022 1:55 PM

## 2022-07-17 NOTE — ED Notes (Signed)
Pt getting agitated. Have notified [place] NP and EDP about pt behavior. Pt now talking to self and yelling in room.

## 2022-07-17 NOTE — ED Notes (Signed)
Placed call to pharmacy requesting medication review in order for MD to place appropriate orders.

## 2022-07-17 NOTE — ED Notes (Signed)
Notified Network engineer to fax covid results and IVC papers to [facility].

## 2022-07-17 NOTE — ED Notes (Signed)
Pt asking for ativan d/t anxiety. Notified MD

## 2022-07-17 NOTE — ED Notes (Signed)
Attempted to call report to [facility], per staff, report to be called on night shift d/t pt arrival time to be after 2000. Updated EDP and [place] team.

## 2022-07-17 NOTE — ED Notes (Signed)
Notified providers that pt is escalating

## 2022-07-17 NOTE — ED Notes (Addendum)
Geodon given w/o incident

## 2022-07-17 NOTE — Consult Note (Signed)
[place] Psychiatry Consult   Reason for Consult:  Psych consult  Referring Physician:  Fransico Meadow, MD Patient Identification: [name] MRN:  [id number] Principal Diagnosis: Suicidal ideation Diagnosis:  Principal Problem:   Suicidal ideation Active Problems:   Schizoaffective disorder ([place])   Total Time spent with patient: 30 minutes  Subjective:   [name] is a 32 y.o. female patient admitted to the [facility] emergency department with complaints of auditory and visual hallucinations and suicidal ideations with a plan to overdose lithium and Haldol.  HPI:  [name] is a 32 year old female patient with a past psychiatric history significant for schizoaffective disorder, bipolar type, MDD, suicidal ideation, and recurrent suicide attempts. Patient seen and evaluated face-to-face by this provider, chart reviewed and case discussed with Dr. Dwyane Dee. On evaluation, patient is alert and oriented x 4. Her thought process is linear and speech is clear and coherent.  Her mood is depressed and affect is congruent. She endorses suicidal ideations with a plan to overdose on her medications, more specifically the Haldol and lithium. She reports access to these medications. She is unable to contract for safety at this time. She reports multiple past suicide attempts by OD in the past. She denies self-injurious behaviors. She denies homicidal ideations. She denies auditory or visual hallucinations. There is no objective evidence that the patient is currently responding to internal or external stimuli. However, she does appear to experience some delusional thought content. She reports imagining voodoo and witchcraft that she feels is real. She states that the images are scary. She reports feeling increasingly depressed. She describes her depressive symptoms as sleeping too much, all day and all night, poor appetite, sadness, isolation, and irritability. She identifies her  current stressors as not being able to see her mother, and the way her mother treats her bad. She states that her mother told her she cannot come to her home because she has an STD. She denies using illicit drugs or alcohol. She continues to receive outpatient psychiatry through Psychotherapeutic ACT team. She states that she is currently prescribed lithium 450 mg p.o. twice daily and Haldol 5 mg p.o. 3 times daily. Lithium level on 07/16/22 was 0.06. Patient states that she is compliant with taking her medications. She is unable to recall other medications that she is currently prescribed. She resides alone in an apartment.   I contacted psychotherapeutic ACT team lead Remo Lipps [phone] to verify patient's home medications, however no answer, left HIPAA compliant voicemail. I also attempted to contact psychotherapeutic main contact number [phone], however no answer, left HIPAA compliant voicemail.  Past Psychiatric History:  Bipolar 1 disorder, generalized anxiety disorder, major depressive disorder, suicidal ideation, impulse control disorder, schizoaffective disorder, schizophrenia, disorganized, adjustment disorder with emotional disturbance, affective psychosis, borderline intellectual functioning, recurrent suicide attempts, and numerous inpatient admissions. Last inpatient hospitalization patient was accepted to [facility] 06/20/2022.  Risk to Self:Yes   Risk to Others:  Yes  Prior Inpatient Therapy:  Yes  Prior Outpatient Therapy:  Yes   Past Medical History:  Past Medical History:  Diagnosis Date   Anxiety    Bipolar 1 disorder ([place])    Cognitive deficits    Depression    Diabetes mellitus without complication ([place])    Hypertension    Mental disorder    Mental health disorder    Obesity     Past Surgical History:  Procedure Laterality Date   CESAREAN SECTION  CESAREAN SECTION N/A 04/25/2013   Procedure: REPEAT CESAREAN SECTION;  Surgeon: [name], MD;  Location: [facility];  Service: Obstetrics;  Laterality: N/A;   MASS EXCISION N/A 06/03/2012   Procedure: EXCISION MASS;  Surgeon: [name], MD;  Location: [place];  Service: ENT;  Laterality: N/A;  Excision uvula mass   TONSILLECTOMY N/A 06/03/2012   Procedure: TONSILLECTOMY;  Surgeon: [name], MD;  Location: [place];  Service: ENT;  Laterality: N/A;   TONSILLECTOMY     Family History:  Family History  Problem Relation Age of Onset   Hypertension Mother    Diabetes Father    Family Psychiatric  History: No known history reported by patient.   Social History:  Social History   Substance and Sexual Activity  Alcohol Use Not Currently     Social History   Substance and Sexual Activity  Drug Use Not Currently   Types: "Crack" cocaine, Other-see comments   Comment: Patient reports hx of smoking Crack    Social History   Socioeconomic History   Marital status: Single    Spouse name: Not on file   Number of children: Not on file   Years of education: Not on file   Highest education level: Not on file  Occupational History   Not on file  Tobacco Use   Smoking status: Every Day    Packs/day: 1    Types: Cigars, Cigarettes   Smokeless tobacco: Never   Tobacco comments:    Pt declined  Vaping Use   Vaping Use: Never used  Substance and Sexual Activity   Alcohol use: Not Currently   Drug use: Not Currently    Types: "Crack" cocaine, Other-see comments    Comment: Patient reports hx of smoking Crack   Sexual activity: Not Currently    Birth control/protection: None    Comment: occasionally  Other Topics Concern   Not on file  Social History Narrative   ** Merged History Encounter **       Social Determinants of Health   Financial Resource Strain: Not on file  Food Insecurity: No Food Insecurity (04/10/2022)   Hunger Vital Sign    Worried About Running Out of Food in the Last Year: Never true    Ran Out of  Food in the Last Year: Never true  Transportation Needs: No Transportation Needs (04/10/2022)   PRAPARE - Hydrologist (Medical): No    Lack of Transportation (Non-Medical): No  Physical Activity: Not on file  Stress: Not on file  Social Connections: Not on file   Additional Social History:    Allergies:   Allergies  Allergen Reactions   Wellbutrin [Bupropion] Shortness Of Breath   Omnipaque [Iohexol] Swelling and Other (See Comments)    Eye swelling   Penicillins Hives and Other (See Comments)    Has patient had a PCN reaction causing immediate rash, facial/tongue/throat swelling, SOB or lightheadedness with hypotension: Unknown Has patient had a PCN reaction causing severe rash involving mucus membranes or skin necrosis: Yes Has patient had a PCN reaction that required hospitalization Unknown Has patient had a PCN reaction occurring within the last 10 years: No If all of the above answers are "NO", then may proceed with Cephalosporin use.   Atarax [Hydroxyzine] Other (See Comments)    Causes hyperactivity, makes pt want to "fight"   Contrast Media [Iodinated Contrast Media] Other (See Comments)    Eyes swell up  Cymbalta [Duloxetine Hcl] Other (See Comments)    No appetite and makes the patient "act up"    Labs:  Results for orders placed or performed during the hospital encounter of 07/16/22 (from the past 48 hour(s))  Urine rapid drug screen (hosp performed)     Status: None   Collection Time: 07/16/22  5:12 PM  Result Value Ref Range   Opiates NONE DETECTED NONE DETECTED   Cocaine NONE DETECTED NONE DETECTED   Benzodiazepines NONE DETECTED NONE DETECTED   Amphetamines NONE DETECTED NONE DETECTED   Tetrahydrocannabinol NONE DETECTED NONE DETECTED   Barbiturates NONE DETECTED NONE DETECTED    Comment: (NOTE) DRUG SCREEN FOR MEDICAL PURPOSES ONLY.  IF CONFIRMATION IS NEEDED FOR ANY PURPOSE, NOTIFY LAB WITHIN 5 DAYS.  LOWEST DETECTABLE  LIMITS FOR URINE DRUG SCREEN Drug Class                     Cutoff (ng/mL) Amphetamine and metabolites    1000 Barbiturate and metabolites    200 Benzodiazepine                 200 Opiates and metabolites        300 Cocaine and metabolites        300 THC                            50 Performed at [facility], [place] [address], [place], [place] [address]   Urinalysis, Routine w reflex microscopic -Urine, Clean Catch     Status: Abnormal   Collection Time: 07/16/22  5:18 PM  Result Value Ref Range   Color, Urine YELLOW YELLOW   APPearance CLOUDY (A) CLEAR   Specific Gravity, Urine >1.030 (H) 1.005 - 1.030   pH 6.0 5.0 - 8.0   Glucose, UA 100 (A) NEGATIVE mg/dL   Hgb urine dipstick NEGATIVE NEGATIVE   Bilirubin Urine SMALL (A) NEGATIVE   Ketones, ur NEGATIVE NEGATIVE mg/dL   Protein, ur 100 (A) NEGATIVE mg/dL   Nitrite NEGATIVE NEGATIVE   Leukocytes,Ua NEGATIVE NEGATIVE    Comment: Performed at [facility] [address] [place], [place] [address]  Urinalysis, Microscopic (reflex)     Status: Abnormal   Collection Time: 07/16/22  5:18 PM  Result Value Ref Range   RBC / HPF 0-5 0 - 5 RBC/hpf   WBC, UA 0-5 0 - 5 WBC/hpf   Bacteria, UA MANY (A) NONE SEEN   Squamous Epithelial / HPF 0-5 0 - 5 /HPF   Mucus PRESENT     Comment: Performed at [facility], [place] [address], [place], [place] [address]  I-Stat beta hCG blood, ED     Status: None   Collection Time: 07/16/22  5:32 PM  Result Value Ref Range   I-stat hCG, quantitative <5.0 <5 mIU/mL   Comment 3            Comment:   GEST. AGE      CONC.  (mIU/mL)   <=1 WEEK        5 - 50     2 WEEKS       50 - 500     3 WEEKS       100 - 10,000     4 WEEKS     1,000 - 30,000        FEMALE AND NON-PREGNANT FEMALE:     LESS THAN  5 mIU/mL   Comprehensive metabolic panel     Status: Abnormal   Collection Time: 07/16/22  8:00 PM  Result Value Ref Range   Sodium 138 135 - 145 mmol/L   Potassium 3.5 3.5 - 5.1 mmol/L    Chloride 104 98 - 111 mmol/L   CO2 28 22 - 32 mmol/L   Glucose, Bld 193 (H) 70 - 99 mg/dL    Comment: Glucose reference range applies only to samples taken after fasting for at least 8 hours.   BUN <5 (L) 6 - 20 mg/dL   Creatinine, Ser 0.85 0.44 - 1.00 mg/dL   Calcium 9.3 8.9 - 10.3 mg/dL   Total Protein 7.6 6.5 - 8.1 g/dL   Albumin 3.9 3.5 - 5.0 g/dL   AST 38 15 - 41 U/L   ALT 40 0 - 44 U/L   Alkaline Phosphatase 55 38 - 126 U/L   Total Bilirubin 0.5 0.3 - 1.2 mg/dL   GFR, Estimated >60 >60 mL/min    Comment: (NOTE) Calculated using the CKD-EPI Creatinine Equation (2021)    Anion gap 6 5 - 15    Comment: Performed at [facility] [address], [place], [place] [address]  Ethanol     Status: None   Collection Time: 07/16/22  8:00 PM  Result Value Ref Range   Alcohol, Ethyl (B) <10 <10 mg/dL    Comment: (NOTE) Lowest detectable limit for serum alcohol is 10 mg/dL.  For medical purposes only. Performed at [facility], [place] [address] [place], [place] [address]   CBC with Diff     Status: Abnormal   Collection Time: 07/16/22  8:00 PM  Result Value Ref Range   WBC 10.4 4.0 - 10.5 K/uL   RBC 4.80 3.87 - 5.11 MIL/uL   Hemoglobin 11.4 (L) 12.0 - 15.0 g/dL   HCT 36.9 36.0 - 46.0 %   MCV 76.9 (L) 80.0 - 100.0 fL   MCH 23.8 (L) 26.0 - 34.0 pg   MCHC 30.9 30.0 - 36.0 g/dL   RDW 16.5 (H) 11.5 - 15.5 %   Platelets 310 150 - 400 K/uL   nRBC 0.0 0.0 - 0.2 %   Neutrophils Relative % 60 %   Neutro Abs 6.2 1.7 - 7.7 K/uL   Lymphocytes Relative 33 %   Lymphs Abs 3.4 0.7 - 4.0 K/uL   Monocytes Relative 5 %   Monocytes Absolute 0.6 0.1 - 1.0 K/uL   Eosinophils Relative 2 %   Eosinophils Absolute 0.2 0.0 - 0.5 K/uL   Basophils Relative 0 %   Basophils Absolute 0.0 0.0 - 0.1 K/uL   Immature Granulocytes 0 %   Abs Immature Granulocytes 0.04 0.00 - 0.07 K/uL    Comment: Performed at [facility] [address] [place], [place] [address]  Acetaminophen level      Status: Abnormal   Collection Time: 07/16/22  8:00 PM  Result Value Ref Range   Acetaminophen (Tylenol), Serum <10 (L) 10 - 30 ug/mL    Comment: (NOTE) Therapeutic concentrations vary significantly. A range of 10-30 ug/mL  may be an effective concentration for many patients. However, some  are best treated at concentrations outside of this range. Acetaminophen concentrations >150 ug/mL at 4 hours after ingestion  and >50 ug/mL at 12 hours after ingestion are often associated with  toxic reactions.  Performed at [facility], [name] [address], [place], [place] [id number]   Salicylate level  Status: Abnormal   Collection Time: 07/16/22  8:00 PM  Result Value Ref Range   Salicylate Lvl [id number] (L) 7.0 - 30.0 mg/dL    Comment: Performed at [facility] [address] [place], [place] [address]  RPR     Status: None   Collection Time: 07/16/22  8:00 PM  Result Value Ref Range   RPR Ser Ql NON REACTIVE NON REACTIVE    Comment: Performed at [facility], [place] [address] [place], [place] [address]  Lithium level     Status: Abnormal   Collection Time: 07/16/22  8:00 PM  Result Value Ref Range   Lithium Lvl 0.06 (L) 0.60 - 1.20 mmol/L    Comment: Performed at [facility] [address] [place], [place] [address]  HIV Antibody (routine testing w rflx)     Status: None   Collection Time: 07/16/22  8:00 PM  Result Value Ref Range   HIV Screen 4th Generation wRfx Non Reactive Non Reactive    Comment: Performed at [facility], [place] [address] [place], [place] [address]  Wet prep, genital     Status: None   Collection Time: 07/16/22  8:15 PM   Specimen: PATH Cytology Cervicovaginal Ancillary Only  Result Value Ref Range   Yeast Wet Prep HPF POC NONE SEEN NONE SEEN   Trich, Wet Prep NONE SEEN NONE SEEN   Clue Cells Wet Prep HPF POC NONE SEEN NONE SEEN   WBC, Wet Prep HPF POC <10 <10   Sperm NONE SEEN     Comment: Performed at [facility], [place] [address] [place], [place] [address]  CBG monitoring, ED     Status: Abnormal   Collection Time: 07/17/22  7:48 AM  Result Value Ref Range   Glucose-Capillary 227 (H) 70 - 99 mg/dL    Comment: Glucose reference range applies only to samples taken after fasting for at least 8 hours.    Current Facility-Administered Medications  Medication Dose Route Frequency Provider Last Rate Last Admin   insulin aspart (novoLOG) injection 0-15 Units  0-15 Units Subcutaneous TID WC Fransico Meadow, MD   5 Units at 07/17/22 [id number]   nitrofurantoin (macrocrystal-monohydrate) (MACROBID) capsule 100 mg  100 mg Oral Q12H Fransico Meadow, MD   100 mg at 07/17/22 [id number]   Current Outpatient Medications  Medication Sig Dispense Refill   amLODipine (NORVASC) 10 MG tablet Take 10 mg by mouth daily.     benztropine (COGENTIN) 0.5 MG tablet Take 1 tablet (0.5 mg total) by mouth 2 (two) times daily. For eps 60 tablet 0   Cholecalciferol (VITAMIN D3) 25 MCG (1000 UT) CAPS Take 1,000 Units by mouth daily.     clomiPRAMINE (ANAFRANIL) 25 MG capsule Take 25 mg by mouth at bedtime. Take 1 capsule at bedtime for 3 days then 2 capsules for 3 days then 3 capsules at bedtime     diclofenac Sodium (VOLTAREN) 1 % GEL Apply 2 g topically 4 (four) times daily. 50 g 0   gabapentin (NEURONTIN) 100 MG capsule Take 1 capsule (100 mg total) by mouth 3 (three) times daily. For agitation 90 capsule 0   haloperidol (HALDOL) 10 MG tablet Take 1 tablet (10 mg total) by mouth at bedtime. For mood stabilization (Patient taking differently: Take 5 mg by mouth 3 (three) times daily. For mood stabilization) 30 tablet 0   insulin aspart protamine- aspart (NOVOLOG MIX 70/30) (70-30) 100 UNIT/ML injection  Inject 20 Units into the skin 2 (two) times daily with a meal.     lithium carbonate (ESKALITH) 450 MG ER tablet Take 1 tablet (450 mg total) by mouth at bedtime. For mood stabilization 30 tablet 0   metoprolol tartrate (LOPRESSOR) 25 MG tablet Take 25  mg by mouth 2 (two) times daily.     ondansetron (ZOFRAN-ODT) 4 MG disintegrating tablet Take 1 tablet (4 mg total) by mouth every 8 (eight) hours as needed for nausea or vomiting. 10 tablet 0   simvastatin (ZOCOR) 40 MG tablet Take 40 mg by mouth at bedtime.     traZODone (DESYREL) 50 MG tablet Take 1 tablet (50 mg total) by mouth at bedtime as needed for sleep. (Patient not taking: Reported on 07/06/2022) 30 tablet 0   venlafaxine XR (EFFEXOR-XR) 150 MG 24 hr capsule Take 150 mg by mouth daily with breakfast.     venlafaxine XR (EFFEXOR-XR) 75 MG 24 hr capsule Take 1 capsule (75 mg total) by mouth daily with breakfast. For depression (Patient not taking: Reported on 07/06/2022) 30 capsule 0    Musculoskeletal: Strength & Muscle Tone: within normal limits Gait & Station: normal Patient leans: N/A   Psychiatric Specialty Exam:  Presentation  General Appearance:  Appropriate for Environment  Eye Contact: Fair  Speech: Clear and Coherent  Speech Volume: Normal  Handedness: Left   Mood and Affect  Mood: Depressed  Affect: Congruent   Thought Process  Thought Processes: Coherent  Descriptions of Associations:Intact  Orientation:Full (Time, Place and Person)  Thought Content:Delusions  History of Schizophrenia/Schizoaffective disorder:Yes  Duration of Psychotic Symptoms:Greater than six months  Hallucinations:Hallucinations: None  Ideas of Reference:Delusions  Suicidal Thoughts:Suicidal Thoughts: Yes, Active SI Active Intent and/or Plan: With Plan; With Intent  Homicidal Thoughts:Homicidal Thoughts: No   Sensorium  Memory: Immediate Fair; Recent Fair; Remote Fair  Judgment: Poor  Insight: Poor; Lacking   Executive Functions  Concentration: Fair  Attention Span: Fair  Recall: [employer] of Knowledge: Fair  Language: Fair   Psychomotor Activity  Psychomotor Activity:Psychomotor Activity: Normal   Assets  Assets: Music therapist; Desire for Improvement; Financial Resources/Insurance; Housing; Social Support   Sleep  Sleep:Sleep: Fair   Physical Exam: Physical Exam Cardiovascular:     Rate and Rhythm: Normal rate.  Pulmonary:     Effort: Pulmonary effort is normal.  Musculoskeletal:     Cervical back: Normal range of motion.  Neurological:     Mental Status: She is alert and oriented to person, place, and time.    Review of Systems  Constitutional: Negative.   HENT: Negative.    Respiratory: Negative.    Cardiovascular: Negative.   Gastrointestinal: Negative.   Endo/Heme/Allergies: Negative.   Psychiatric/Behavioral:  Positive for depression and suicidal ideas.    Blood pressure (!) 132/97, pulse (!) 117, temperature 98.5 F (36.9 C), resp. rate 17, height 5\' 7"  (1.702 m), weight 92 kg, last menstrual period 05/06/2022, SpO2 100 %. Body mass index is 31.77 kg/m.  Treatment Plan Summary: Patient is recommended for inpatient psychiatric treatment for active suicidal ideations with a plan and intent. Psychiatry CSW to seek appropriate placement. This provider will contact psychotherapeutic ACT team to verify the patient's home medications and restart her home medications last verified.   Disposition: Recommend psychiatric Inpatient admission when medically cleared.  Dreux Mcgroarty L, NP 07/17/2022 10:02 AM

## 2022-07-17 NOTE — ED Notes (Signed)
Notified EDP of pt home meds not ordered

## 2022-07-17 NOTE — Progress Notes (Signed)
Pt is Accepted to [facility] will transfer 07/18/22 due to delay with Transport because of IVC status requiring [employer] only.  CSW received an update from [place], RN that pt will transfer to [facility] 07/18/22  due to IVC requiring Sheriff transport.   CSW attempted to contact [facility] [phone] multiple times but was unsuccessful. CSW left a HIPAA complaint voicemail.     Benjaman Kindler, MSW, [facility] 07/17/2022 9:48 PM

## 2022-07-17 NOTE — Inpatient Diabetes Management (Signed)
Inpatient Diabetes Program Recommendations  AACE/ADA: New Consensus Statement on Inpatient Glycemic Control (2015)  Target Ranges:  Prepandial:   less than 140 mg/dL      Peak postprandial:   less than 180 mg/dL (1-2 hours)      Critically ill patients:  140 - 180 mg/dL    Latest Reference Range & Units 07/17/22 07:48 07/17/22 11:12  Glucose-Capillary 70 - 99 mg/dL 227 (H)  5 units Novolog  266 (H)  8 units Novolog   (H): Data is abnormally high    Admit with: Brought in by the police and ACT team person for auditory and visual hallucinations with a plan to overdose lithium and Haldol  History: DM, Schizoaffective disorder, bipolar disorder, anxiety, and multiple suicide attempts   Home DM Meds: Novolog 70/30 Mix Insulin 20 units BID  Current Orders: Novolog Moderate Correction Scale/ SSI (0-15 units) TID AC     MD- Since pt allowed PO diet, please consider starting 50% home dose of 70/30 Insulin:  70/30 Insulin 10 units BID with meals    --Will follow patient during hospitalization--  Wyn Quaker RN, MSN, [place] [facility] Coordinator Inpatient Glycemic Control Team Team Pager: [phone] (8a-5p)

## 2022-07-17 NOTE — ED Notes (Signed)
Report given to [facility] admissions

## 2022-07-18 LAB — URINE CULTURE

## 2022-07-18 LAB — CBG MONITORING, ED: Glucose-Capillary: 217 mg/dL — ABNORMAL HIGH (ref 70–99)

## 2022-07-18 NOTE — ED Notes (Addendum)
[place] [employer] Officers arrived to transport pt to [facility]

## 2022-07-18 NOTE — Discharge Instructions (Addendum)
Continue nitrofurantoin for the next 3 days

## 2022-09-04 ENCOUNTER — Emergency Department (HOSPITAL_COMMUNITY)
Admission: EM | Admit: 2022-09-04 | Discharge: 2022-09-05 | Disposition: A | Discharge status: Home / Self Care | Payer: Medicaid Other | Attending: Emergency Medicine | Admitting: Emergency Medicine

## 2022-09-04 ENCOUNTER — Other Ambulatory Visit: Payer: Self-pay

## 2022-09-04 DIAGNOSIS — M79602 Pain in left arm: Secondary | ICD-10-CM | POA: Insufficient documentation

## 2022-09-04 NOTE — ED Triage Notes (Signed)
Pt c/o L arm pain x4 months. States that she would like to get an xray. Denies injury. Was using a topical gel for the pain but states it was not really helping. States that she noted swelling today.

## 2022-09-05 ENCOUNTER — Emergency Department (HOSPITAL_COMMUNITY): Payer: Medicaid Other

## 2022-09-05 NOTE — ED Provider Notes (Signed)
[place] EMERGENCY DEPARTMENT AT [facility] Provider Note   CSN: [id number] Arrival date & time: 09/04/22  2247     History  Chief Complaint  Patient presents with   Arm Pain    [name] is a 32 y.o. female.  Patient presents to the emergency department for evaluation of left arm pain.  Patient reports that she has been having pain in the left upper arm for approximately 4 months.  Sometimes it seems swollen.  She denies any injury.  Pain worsens with movement.  She has been rubbing some salve on it.       Home Medications Prior to Admission medications   Medication Sig Start Date End Date Taking? Authorizing Provider  amLODipine (NORVASC) 10 MG tablet Take 10 mg by mouth daily.    [provider]  benztropine (COGENTIN) 0.5 MG tablet Take 1 tablet (0.5 mg total) by mouth 2 (two) times daily. For eps 04/14/22   Armandina Stammer I, NP  Cholecalciferol (VITAMIN D3) 25 MCG (1000 UT) CAPS Take 1,000 Units by mouth daily.    [provider]  clomiPRAMINE (ANAFRANIL) 25 MG capsule Take 25 mg by mouth at bedtime. Take 1 capsule at bedtime for 3 days then 2 capsules for 3 days then 3 capsules at bedtime    [provider]  diclofenac Sodium (VOLTAREN) 1 % GEL Apply 2 g topically 4 (four) times daily. 06/07/22   Antony Madura, PA-C  gabapentin (NEURONTIN) 100 MG capsule Take 1 capsule (100 mg total) by mouth 3 (three) times daily. For agitation 04/14/22   Armandina Stammer I, NP  haloperidol (HALDOL) 10 MG tablet Take 1 tablet (10 mg total) by mouth at bedtime. For mood stabilization Patient taking differently: Take 5 mg by mouth 3 (three) times daily. For mood stabilization 04/14/22   Nwoko, Nicole Kindred I, NP  insulin aspart protamine- aspart (NOVOLOG MIX 70/30) (70-30) 100 UNIT/ML injection Inject 20 Units into the skin 2 (two) times daily with a meal.    [provider]  lithium carbonate (ESKALITH) 450 MG ER tablet Take 1 tablet (450 mg total) by  mouth at bedtime. For mood stabilization 04/14/22   Armandina Stammer I, NP  metoprolol tartrate (LOPRESSOR) 25 MG tablet Take 25 mg by mouth 2 (two) times daily.    [provider]  ondansetron (ZOFRAN-ODT) 4 MG disintegrating tablet Take 1 tablet (4 mg total) by mouth every 8 (eight) hours as needed for nausea or vomiting. 07/09/22   Pricilla Loveless, MD  simvastatin (ZOCOR) 40 MG tablet Take 40 mg by mouth at bedtime.    [provider]  traZODone (DESYREL) 50 MG tablet Take 1 tablet (50 mg total) by mouth at bedtime as needed for sleep. Patient not taking: Reported on 07/06/2022 04/14/22   Armandina Stammer I, NP  venlafaxine XR (EFFEXOR-XR) 150 MG 24 hr capsule Take 150 mg by mouth daily with breakfast.    [provider]  venlafaxine XR (EFFEXOR-XR) 75 MG 24 hr capsule Take 1 capsule (75 mg total) by mouth daily with breakfast. For depression Patient not taking: Reported on 07/06/2022 04/15/22   Armandina Stammer I, NP      Allergies    Wellbutrin [bupropion], Omnipaque [iohexol], Penicillins, Atarax [hydroxyzine], Contrast media [iodinated contrast media], and Cymbalta [duloxetine hcl]    Review of Systems   Review of Systems  Physical Exam Updated Vital Signs BP 132/80   Pulse 90   Temp 98.2 F (36.8 C) (Oral)  Resp 15   Ht 5\' 7"  (1.702 m)   Wt 117.5 kg   LMP 08/30/2022 (Approximate)   SpO2 95%   BMI 40.57 kg/m  Physical Exam Vitals and nursing note reviewed.  Constitutional:      Appearance: Normal appearance.  HENT:     Head: Normocephalic and atraumatic.  Cardiovascular:     Rate and Rhythm: Normal rate.  Pulmonary:     Effort: Pulmonary effort is normal.  Musculoskeletal:     Left shoulder: No swelling or deformity. Normal range of motion.     Left upper arm: Tenderness present. No swelling or deformity.  Neurological:     Mental Status: She is alert.     ED Results / Procedures / Treatments   Labs (all labs ordered are listed, but only abnormal  results are displayed) Labs Reviewed - No data to display  EKG None  Radiology DG Humerus Left  Result Date: 09/05/2022 CLINICAL DATA:  Left arm pain for 4 months. EXAM: LEFT HUMERUS - 2+ VIEW COMPARISON:  None Available. FINDINGS: There is no evidence of fracture or other focal bone lesions. Soft tissues are unremarkable. IMPRESSION: Negative. Electronically Signed   By: Thornell Sartorius M.D.   On: 09/05/2022 04:20    Procedures Procedures    Medications Ordered in ED Medications - No data to display  ED Course/ Medical Decision Making/ A&P                             Medical Decision Making Amount and/or Complexity of Data Reviewed Radiology: ordered.   Presents with nontraumatic left upper arm pain.  Examination does not show any significant abnormality.  No masses, no significant swelling.  No overlying skin changes to suggest infection, no erythema, warmth, fluctuance, induration.  Normal range of motion at the elbow and shoulder.  Radial pulse palpable.        Final Clinical Impression(s) / ED Diagnoses Final diagnoses:  Left arm pain    Rx / DC Orders ED Discharge Orders     None         Naftula Donahue, Canary Brim, MD 09/05/22 [phone]

## 2022-09-28 ENCOUNTER — Ambulatory Visit (HOSPITAL_COMMUNITY)
Admission: EM | Admit: 2022-09-28 | Discharge: 2022-09-29 | Disposition: A | Discharge status: Home / Self Care | Payer: Medicaid Other | Attending: Behavioral Health | Admitting: Behavioral Health

## 2022-09-28 ENCOUNTER — Encounter (HOSPITAL_COMMUNITY): Payer: Self-pay | Admitting: Behavioral Health

## 2022-09-28 ENCOUNTER — Other Ambulatory Visit: Payer: Self-pay

## 2022-09-28 DIAGNOSIS — R45851 Suicidal ideations: Secondary | ICD-10-CM

## 2022-09-28 DIAGNOSIS — F319 Bipolar disorder, unspecified: Secondary | ICD-10-CM | POA: Insufficient documentation

## 2022-09-28 DIAGNOSIS — Z1152 Encounter for screening for COVID-19: Secondary | ICD-10-CM | POA: Insufficient documentation

## 2022-09-28 DIAGNOSIS — F201 Disorganized schizophrenia: Secondary | ICD-10-CM | POA: Insufficient documentation

## 2022-09-28 DIAGNOSIS — R451 Restlessness and agitation: Secondary | ICD-10-CM | POA: Diagnosis present

## 2022-09-28 DIAGNOSIS — Z720 Tobacco use: Secondary | ICD-10-CM | POA: Insufficient documentation

## 2022-09-28 LAB — MAGNESIUM: Magnesium: 1.8 mg/dL (ref 1.7–2.4)

## 2022-09-28 LAB — CBC WITH DIFFERENTIAL/PLATELET
Abs Immature Granulocytes: 0.04 10*3/uL (ref 0.00–0.07)
Basophils Absolute: 0.1 10*3/uL (ref 0.0–0.1)
Basophils Relative: 1 %
Eosinophils Absolute: 0.4 10*3/uL (ref 0.0–0.5)
Eosinophils Relative: 4 %
HCT: 32.4 % — ABNORMAL LOW (ref 36.0–46.0)
Hemoglobin: 10.1 g/dL — ABNORMAL LOW (ref 12.0–15.0)
Immature Granulocytes: 0 %
Lymphocytes Relative: 34 %
Lymphs Abs: 3.3 10*3/uL (ref 0.7–4.0)
MCH: 23.7 pg — ABNORMAL LOW (ref 26.0–34.0)
MCHC: 31.2 g/dL (ref 30.0–36.0)
MCV: 75.9 fL — ABNORMAL LOW (ref 80.0–100.0)
Monocytes Absolute: 0.6 10*3/uL (ref 0.1–1.0)
Monocytes Relative: 6 %
Neutro Abs: 5.4 10*3/uL (ref 1.7–7.7)
Neutrophils Relative %: 55 %
Platelets: 209 10*3/uL (ref 150–400)
RBC: 4.27 MIL/uL (ref 3.87–5.11)
RDW: 17 % — ABNORMAL HIGH (ref 11.5–15.5)
WBC: 9.7 10*3/uL (ref 4.0–10.5)
nRBC: 0 % (ref 0.0–0.2)

## 2022-09-28 LAB — COMPREHENSIVE METABOLIC PANEL
ALT: 18 U/L (ref 0–44)
AST: 19 U/L (ref 15–41)
Albumin: 3.8 g/dL (ref 3.5–5.0)
Alkaline Phosphatase: 53 U/L (ref 38–126)
Anion gap: 7 (ref 5–15)
BUN: 5 mg/dL — ABNORMAL LOW (ref 6–20)
CO2: 26 mmol/L (ref 22–32)
Calcium: 8.8 mg/dL — ABNORMAL LOW (ref 8.9–10.3)
Chloride: 104 mmol/L (ref 98–111)
Creatinine, Ser: 0.89 mg/dL (ref 0.44–1.00)
GFR, Estimated: >60 mL/min (ref >60)
Glucose, Bld: 125 mg/dL — ABNORMAL HIGH (ref 70–99)
Potassium: 3.2 mmol/L — ABNORMAL LOW (ref 3.5–5.1)
Sodium: 137 mmol/L (ref 135–145)
Total Bilirubin: 0.4 mg/dL (ref 0.3–1.2)
Total Protein: 6.7 g/dL (ref 6.5–8.1)

## 2022-09-28 LAB — LIPID PANEL
Cholesterol: 179 mg/dL (ref 0–200)
HDL: 62 mg/dL (ref >40)
LDL Cholesterol: 101 mg/dL — ABNORMAL HIGH (ref 0–99)
Total CHOL/HDL Ratio: 2.9 RATIO
Triglycerides: 80 mg/dL (ref <150)
VLDL: 16 mg/dL (ref 0–40)

## 2022-09-28 LAB — LITHIUM LEVEL: Lithium Lvl: 0.19 mmol/L — ABNORMAL LOW (ref 0.60–1.20)

## 2022-09-28 LAB — ETHANOL: Alcohol, Ethyl (B): <10 mg/dL (ref <10)

## 2022-09-28 LAB — TSH: TSH: 1.374 u[IU]/mL (ref 0.350–4.500)

## 2022-09-28 MED ORDER — VENLAFAXINE HCL ER 150 MG PO CP24
150.0000 mg | ORAL_CAPSULE | Freq: Every day | ORAL | Status: DC
  Administered 2022-09-29: 150 mg via ORAL
  Filled 2022-09-28: qty 1

## 2022-09-28 MED ORDER — GABAPENTIN 100 MG PO CAPS
100.0000 mg | ORAL_CAPSULE | Freq: Three times a day (TID) | ORAL | Status: DC
  Administered 2022-09-28 – 2022-09-29 (×2): 100 mg via ORAL
  Filled 2022-09-28 (×2): qty 1

## 2022-09-28 MED ORDER — ALUM & MAG HYDROXIDE-SIMETH 200-200-20 MG/5ML PO SUSP: 30.0000 mL | ORAL | Status: DC | PRN

## 2022-09-28 MED ORDER — INSULIN ASPART PROT & ASPART (70-30 MIX) 100 UNIT/ML ~~LOC~~ SUSP
20.0000 [IU] | Freq: Two times a day (BID) | SUBCUTANEOUS | Status: DC
  Administered 2022-09-29: 20 [IU] via SUBCUTANEOUS

## 2022-09-28 MED ORDER — MAGNESIUM HYDROXIDE 400 MG/5ML PO SUSP: 30.0000 mL | Freq: Every day | ORAL | Status: DC | PRN

## 2022-09-28 MED ORDER — TRAZODONE HCL 50 MG PO TABS: 50.0000 mg | ORAL_TABLET | Freq: Every evening | ORAL | Status: DC | PRN

## 2022-09-28 MED ORDER — METOPROLOL TARTRATE 25 MG PO TABS
25.0000 mg | ORAL_TABLET | Freq: Two times a day (BID) | ORAL | Status: DC
  Administered 2022-09-28 – 2022-09-29 (×2): 25 mg via ORAL
  Filled 2022-09-28 (×2): qty 1

## 2022-09-28 MED ORDER — ATORVASTATIN CALCIUM 10 MG PO TABS
20.0000 mg | ORAL_TABLET | Freq: Every day | ORAL | Status: DC
  Administered 2022-09-29: 20 mg via ORAL
  Filled 2022-09-28: qty 2

## 2022-09-28 MED ORDER — BENZTROPINE MESYLATE 0.5 MG PO TABS
0.5000 mg | ORAL_TABLET | Freq: Two times a day (BID) | ORAL | Status: DC
  Administered 2022-09-28 – 2022-09-29 (×2): 0.5 mg via ORAL
  Filled 2022-09-28 (×2): qty 1

## 2022-09-28 MED ORDER — LITHIUM CARBONATE ER 450 MG PO TBCR
450.0000 mg | EXTENDED_RELEASE_TABLET | Freq: Every day | ORAL | Status: DC
  Administered 2022-09-28: 450 mg via ORAL
  Filled 2022-09-28: qty 1

## 2022-09-28 MED ORDER — AMLODIPINE BESYLATE 10 MG PO TABS
10.0000 mg | ORAL_TABLET | Freq: Every day | ORAL | Status: DC
  Administered 2022-09-29: 10 mg via ORAL
  Filled 2022-09-28: qty 1

## 2022-09-28 MED ORDER — HALOPERIDOL 5 MG PO TABS
10.0000 mg | ORAL_TABLET | Freq: Every day | ORAL | Status: DC
  Administered 2022-09-28: 10 mg via ORAL
  Filled 2022-09-28: qty 2

## 2022-09-28 MED ORDER — ACETAMINOPHEN 325 MG PO TABS: 650.0000 mg | ORAL_TABLET | Freq: Four times a day (QID) | ORAL | Status: DC | PRN

## 2022-09-28 NOTE — ED Notes (Addendum)
Patient resting quietly in bed with eyes closed. Respirations equal and unlabored, skin warm and dry, NAD. No change in assessment or acuity. Routine safety checks conducted according to facility protocol. Will continue to monitor for safety.   

## 2022-09-28 NOTE — BH Assessment (Signed)
Comprehensive Clinical Assessment (CCA) Note  09/28/2022 [name] [id number]   Disposition: Per Erskine Emery, NP inpatient treatment is recommended.  [facility] to review.  Disposition SW to pursue appropriate inpatient options.  The patient demonstrates the following risk factors for suicide: Chronic risk factors for suicide include: psychiatric disorder of Schizoaffective, bipolar type and previous suicide attempts x2, most recent by overdose in 2023 after which she was admitted to ICU . Acute risk factors for suicide include: family or marital conflict, social withdrawal/isolation, and loss (financial, interpersonal, professional). Protective factors for this patient include: positive social support and positive therapeutic relationship. Considering these factors, the overall suicide risk at this point appears to be moderate. Patient is appropriate for outpatient follow up.  Patient is a 32 year old female with a history of Schizoaffective Disorder, bipolar type, Borderline Intellectual functioning and Generalized Anxiety Disorder who presents voluntarily via [facility] for assessment. Patient is distressed and whaling on arrival, screaming about her boyfriend.  She continued to yell and cry throughout assessment, stating she is having "problems with my boyfriend.  We've been together 4 years and he thinks we are married, and we aren't, he has mental problems."  Patient states she feels her boyfriend is taking advantage of her.  Patient is also upset with her mother stating she "cares about my sisters more than me.  She won't even answer the phone."  Patient tried calling her mother from the [facility] cell phone and began screaming again when mother didn't answer.  She then began discussing her plan to commit suicide by "taking all of my medications."  She shares she will overdose and "I will still go to [facility]."  She reports she is taking medications as prescribed by [name], however does not feel the  medications are helping.  She states ACTT staff "hardly every see me. I have nobody.  Nobody cares about me and that's why I'm gonna kill myself."  Patient was recently admitted to [facility] and she has numerous past inpatient admissions.  She is asking to go to any hospital except for [facility].    Chief Complaint:  Chief Complaint  Patient presents with   Evaluation   Visit Diagnosis: Schizoaffective Disorder, bipolar type    CCA Screening, Triage and Referral (STR)  Patient Reported Information How did you hear about [place]? Legal System  What Is the Reason for Your Visit/Call Today? Pt reports issues with her boyfriend and patient is very emotional. Pt is screaming and wailing. Unable to complete full assessment due to pt current mental status.  How Long Has This Been Causing You Problems? 1 wk - 1 month  What Do You Feel Would Help You the Most Today? Treatment for Depression or other mood problem   Have You Recently Had Any Thoughts About Hurting Yourself? Yes  Are You Planning to Commit Suicide/Harm Yourself At This time? Yes  Flowsheet Row ED from 09/28/2022 in [facility] ED from 09/04/2022 in [facility] Emergency Department at [facility] ED from 07/16/2022 in [facility] Emergency Department at [facility]  C-SSRS RISK CATEGORY High Risk No Risk High Risk         Have you Recently Had Thoughts About Hurting Someone [name]? No  Are You Planning to Harm Someone at This Time? No  Explanation: N/A   Have You Used Any Alcohol or Drugs in the Past 24 Hours? No  What Did You Use and How Much? N/A  Do You Currently Have a Therapist/Psychiatrist? Yes  Name of Therapist/Psychiatrist: Name of Therapist/Psychiatrist: [name]   Have You Been Recently Discharged From Any Office Practice or Programs? No  Explanation of Discharge From Practice/Program: N/A     CCA Screening Triage Referral Assessment Type of  Contact: Face-to-Face  Telemedicine Service Delivery:   Is this Initial or Reassessment?   Date Telepsych consult ordered in [facility]:    Time Telepsych consult ordered in [facility]:    Location of Assessment: [facility] Assessment Services  Provider Location: [facility] Assessment Services   Collateral Involvement: N/A   Does Patient Have a Automotive engineer Guardian? No  Legal Guardian Contact Information: N/A  Copy of Legal Guardianship Form: -- (N/A)  Legal Guardian Notified of Arrival: -- (N/A)  Legal Guardian Notified of Pending Discharge: -- (N/A)  If Minor and Not Living with Parent(s), Who has Custody? N/A  Is CPS involved or ever been involved? Never  Is APS involved or ever been involved? Never   Patient Determined To Be At Risk for Harm To Self or Others Based on Review of Patient Reported Information or Presenting Complaint? Yes, for Self-Harm  Method: -- (N/A, no HI)  Availability of Means: -- (N/A, no HI)  Intent: -- (N/A, no HI)  Notification Required: -- (N/A, no HI)  Additional Information for Danger to Others Potential: -- (N/A, no HI)  Additional Comments for Danger to Others Potential: N/A, no HI  Are There Guns or Other Weapons in Your Home? No  Types of Guns/Weapons: N/A  Are These Weapons Safely Secured?                            -- (N/A)  Who Could Verify You Are Able To Have These Secured: N/A  Do You Have any Outstanding Charges, Pending Court Dates, Parole/Probation? No  Contacted To Inform of Risk of Harm To Self or Others: Law Enforcement    Does Patient Present under Involuntary Commitment? No    [place] of Residence: [place]   Patient Currently Receiving the Following Services: [facility] Psychologist, educational)   Determination of Need: Urgent (48 hours)   Options For Referral: Inpatient Hospitalization     CCA Biopsychosocial Patient Reported Schizophrenia/Schizoaffective Diagnosis in Past: Yes   Strengths: Patient  is engaged in ACTT services.   Mental Health Symptoms Depression:   Sleep (too much or little); Irritability; Hopelessness; Worthlessness   Duration of Depressive symptoms:  Duration of Depressive Symptoms: Greater than two weeks   Mania:   Recklessness; Racing thoughts; Irritability   Anxiety:    Worrying; Tension   Psychosis:   None   Duration of Psychotic symptoms:  Duration of Psychotic Symptoms: N/A   Trauma:   None   Obsessions:   None   Compulsions:   None   Inattention:   None   Hyperactivity/Impulsivity:   N/A   Oppositional/Defiant Behaviors:   N/A   Emotional Irregularity:   N/A   Other Mood/Personality Symptoms:   N/A    Mental Status Exam Appearance and self-care  Stature:   Average   Weight:   Obese   Clothing:   Casual (Scrubs)   Grooming:   Normal   Cosmetic use:   Age appropriate   Posture/gait:   Tense   Motor activity:   Agitated   Sensorium  Attention:   Vigilant   Concentration:   Preoccupied; Variable   Orientation:   Object; Person;  Place; Time   Recall/memory:   Normal   Affect and Mood  Affect:   Labile   Mood:   Negative; Irritable   Relating  Eye contact:   Normal   Facial expression:   Constricted   Attitude toward examiner:   Defensive; Irritable   Thought and Language  Speech flow:  Loud; Pressured   Thought content:   Appropriate to Mood and Circumstances   Preoccupation:   None   Hallucinations:   None   Organization:   Disorganized; Perseverations   Company secretary of Knowledge:   Average   Intelligence:   Below average   Abstraction:   Functional   Judgement:   Fair   Reality Testing:   Distorted   Insight:   Lacking   Decision Making:   Impulsive; Vacilates   Social Functioning  Social Maturity:   Impulsive; Irresponsible   Social Judgement:   Heedless   Stress  Stressors:   Relationship; Family conflict   Coping Ability:    Overwhelmed   Skill Deficits:   None; Communication; Intellect/education; Interpersonal; Self-control   Supports:   Friends/Service system; Support needed     Religion: Religion/Spirituality Are You A Religious Person?: No How Might This Affect Treatment?: N/A  Leisure/Recreation: Leisure / Recreation Do You Have Hobbies?: No  Exercise/Diet: Exercise/Diet Do You Exercise?: No Have You Gained or Lost A Significant Amount of Weight in the Past Six Months?: No Do You Follow a Special Diet?: No Do You Have Any Trouble Sleeping?: Yes Explanation of Sleeping Difficulties: poor sleep recently   CCA Employment/Education Employment/Work Situation: Employment / Work Situation Employment Situation: On disability Why is Patient on Disability: Pt states for a learning disability How Long has Patient Been on Disability: Since age 43 Patient's Job has Been Impacted by Current Illness: No Has Patient ever Been in the [employer]?: No  Education: Education Is Patient Currently Attending School?: No Last Grade Completed: 11 Did You Attend College?: No Did You Have An Individualized Education Program (IIEP): No Did You Have Any Difficulty At School?: No Patient's Education Has Been Impacted by Current Illness: No   CCA Family/Childhood History Family and Relationship History: Family history Marital status: Long term relationship Long term relationship, how long?: 4 yrs What types of issues is patient dealing with in the relationship?: Patient states her boyfriend has been spending more time with "then men than me."  She shares he has been with his friends and doesn't spend much time with her and she feels "used." Additional relationship information: N/A Does patient have children?: Yes How many children?: 3 How is patient's relationship with their children?: Patient states her children were adopted and she isn't able to speak to them "until they are 18"  Childhood History:   Childhood History By whom was/is the patient raised?: Mother Did patient suffer any verbal/emotional/physical/sexual abuse as a child?: No Did patient suffer from severe childhood neglect?: No Has patient ever been sexually abused/assaulted/raped as an adolescent or adult?: No Was the patient ever a victim of a crime or a disaster?: No Witnessed domestic violence?: No Has patient been affected by domestic violence as an adult?: No       CCA Substance Use Alcohol/Drug Use: Alcohol / Drug Use Pain Medications: See MAR Prescriptions: See MAR Over the Counter: See MAR History of alcohol / drug use?: No history of alcohol / drug abuse  ASAM's:  Six Dimensions of Multidimensional Assessment  Dimension 1:  Acute Intoxication and/or Withdrawal Potential:      Dimension 2:  Biomedical Conditions and Complications:      Dimension 3:  Emotional, Behavioral, or Cognitive Conditions and Complications:     Dimension 4:  Readiness to Change:     Dimension 5:  Relapse, Continued use, or Continued Problem Potential:     Dimension 6:  Recovery/Living Environment:     ASAM Severity Score:    ASAM Recommended Level of Treatment:     Substance use Disorder (SUD)    Recommendations for Services/Supports/Treatments:    Discharge Disposition:    DSM5 Diagnoses: Patient Active Problem List   Diagnosis Date Noted   Agitation 09/28/2022   Suicidal ideations 01/04/2022   Suicidal overdose (HCC) 03/23/2021   Syphilis 07/15/2020   Malingering 06/05/2020   Gastroesophageal reflux disease 05/04/2020   Hyperglycemia due to type 2 diabetes mellitus (HCC) 05/04/2020   Long term (current) use of insulin (HCC) 05/04/2020   Migraine without aura 05/04/2020   Morbid obesity (HCC) 05/04/2020   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 2 diabetes mellitus (HCC) 05/04/2020   Prolapsed internal hemorrhoids 05/04/2020   Vitamin D deficiency 05/04/2020   Other symptoms and signs  involving cognitive functions and awareness 05/04/2020   Suicide attempt (HCC)    Anxiety state 03/06/2020   Schizophrenia (HCC) 09/13/2019   Bipolar I disorder, most recent episode depressed (HCC) 06/23/2019   MDD (major depressive disorder) 10/10/2018   Schizoaffective disorder, bipolar type (HCC) 09/25/2018   Affective psychosis, bipolar (HCC) 06/13/2018   HTN (hypertension) 05/03/2018   Tobacco use disorder 05/03/2018   Adjustment disorder with emotional disturbance 01/02/2018   Schizophrenia, 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   Moderate bipolar I disorder, most recent episode depressed (HCC)    Schizoaffective disorder (HCC)    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/2017   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   OCD (obsessive compulsive disorder) 10/05/2016   Major depressive disorder, recurrent episode, mild (HCC) 05/04/2016   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   Learning disability 07/18/2015   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/2015   Diabetes mellitus (HCC) 07/18/2015   MDD (major depressive disorder), recurrent, severe, 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

## 2022-09-28 NOTE — ED Notes (Signed)
Pt a/o x 4, denies SI/HI/AVH. Pleasant and engaged with staff and cooperative. No noted distress. Will continue to monitor for safety

## 2022-09-28 NOTE — ED Notes (Signed)
[facility] C[facility] called to collect STAT specimens and to deliver to [facility]. 

## 2022-09-28 NOTE — ED Provider Notes (Signed)
[facility] Continuous Assessment Admission H&P  Date: 09/28/22 Patient Name: [name] MRN: [id number] Chief Complaint: "I'm having problems with my boyfriend"  Diagnoses:  Final diagnoses:  Suicidal ideations  Agitation    HPI: [name] is a 32 y.o. female patient with a past psychiatric history of bipolar 1 disorder, generalized anxiety disorder, major depressive disorder, suicidal ideation, impulse control disorder, schizoaffective disorder, schizophrenia, disorganized, adjustment disorder with emotional disturbance, affective psychosis, borderline intellectual functioning, recurrent suicide attempts, and numerous inpatient admissions who presented to [facility] voluntarily and unaccompanied via [employer] with complaints of suicidal ideations.  Patient assessed face-to-face by this provider and chart reviewed on 09/28/22. [name], [facility] and security present during assessment. On evaluation, [name] is seated in assessment area in no acute distress. Patient is alert and oriented x4, cooperative with staff but yells and cries throughout assessment. Eye contact is good. Mood is depressed, irritable, angry, and labile with depressed, tearful, labile, and congruent affect. Thought process is coherent with thought content that consists of paranoid ideation. Patient reports suicidal ideations with a plan to overdose on her medications. Patient states "I'm going to kill myself, I'm going to [place]. I'm tired of this bullshit." Patient unable to contract for safety at this time. Patient denies homicidal ideation and states "I just want to punch him, I'm not going to kill him," referring to her boyfriend. Patient reports a history of multiple suicide attempts stating "I just want to die." Per chart review, patient attempted suicide in 2023 by overdose and was admitted to the ICU. Patient denies recent self-harm but states she recently hit herself in the chest while in the police car  "hoping my heart would stop." Patient reports multiple past psychiatric hospitalizations but states "it's been a little while." Per chart review, patient was most recently at [facility] on 07/16/22 for suicidal ideations and has had a total of 18 ED visits in the past 6 months. Patient denies auditory and visual hallucinations. Patient reports symptoms of paranoia stating "I'm seeing this girl following me downtown and [place] the dep[place]." Patient states "I'm having problems with my boyfriend, we've been together 4 years and he thinks we are married and we aren't, he has mental problems. He wants to hang around men instead of me and he's using me for a place to stay and money." Patient also states she is upset with her mother because "she cares about my sisters more than me, she won't even answer the phone." Patient tried calling her mother from the [facility] phone during assessment and began screaming and crying again when her mother didn't answer.   Patient reports poor sleep (2 hours/night) and good appetite. Patient states she lives in [place] with her boyfriend and denies access to weapons/firearms. Patient denies use of alcohol or illicit substances. Patient states she is compliant with her current psychotropic medications but states "I might need a shot" because she feels her medications are not helpful. Patient is currently followed by [facility] and states she last seen someone from her team 2 weeks ago and "they are not helpful. They hardly ever see me. I have nobody. Nobody cares about me and that's why I'm gonna kill myself." Patient is requesting to go to any hospital except for [facility].   Call made to [facility] to verify patient's current medications which are as follows: Gabapentin 100mg  TID, Omeprazole 20mg  daily, Lithium ER 450mg  at bedtime, Novolog 70/30 20 units BID, Benztropine 0.5mg   BID, Trazodone 150mg  at bedtime, Clonidine 0.1mg  daily, Venlafaxine XR 150mg  daily, Haldol 10mg   at bedtime, Metroprolol 25mg  BID, Atorvastatin 20mg  daily, and Amlodipine 10mg  daily.   Patient offered support and encouragement. Discussed admission to the continuous observation unit overnight for safety monitoring with reevaluation in the morning (09/29/22) for SI/HI/AVH. Patient is in agreement with plan of care.   Total Time spent with patient: 45 minutes  Musculoskeletal  Strength & Muscle Tone: within normal limits Gait & Station: normal Patient leans: N/A  Psychiatric Specialty Exam  Presentation General Appearance:  Appropriate for Environment  Eye Contact: Good  Speech: Clear and Coherent (Patient yells throughout assessment)  Speech Volume: Increased  Handedness: Left   Mood and Affect  Mood: Depressed; Irritable; Labile; Angry  Affect: Congruent; Labile; Depressed; Tearful   Thought Process  Thought Processes: Coherent  Descriptions of Associations:Intact  Orientation:Full (Time, Place and Person)  Thought Content:Paranoid Ideation  Diagnosis of Schizophrenia or Schizoaffective disorder in past: Yes   Hallucinations:Hallucinations: None  Ideas of Reference:Paranoia  Suicidal Thoughts:Suicidal Thoughts: Yes, Active SI Active Intent and/or Plan: With Intent; With Plan; With Means to Carry Out; With Access to Means  Homicidal Thoughts:Homicidal Thoughts: No (Patient denies HI, states she's just going to punch her boyfriend "not kill him")   Sensorium  Memory: Immediate Fair; Recent Fair; Remote Fair  Judgment: Poor  Insight: Poor; Lacking   Executive Functions  Concentration: Fair  Attention Span: Fair  Recall: [employer] of Knowledge: Fair  Language: Fair   Psychomotor Activity  Psychomotor Activity: Psychomotor Activity: Normal   Assets  Assets: Communication Skills; Desire for Improvement; Financial Resources/Insurance; Housing; Physical Health; Resilience   Sleep  Sleep: Sleep: Poor Number of Hours of  Sleep: 2   Nutritional Assessment (For OBS and FBC admissions only) Has the patient had a weight loss or gain of 10 pounds or more in the last 3 months?: No Has the patient had a decrease in food intake/or appetite?: No Does the patient have dental problems?: No Does the patient have eating habits or behaviors that may be indicators of an eating disorder including binging or inducing vomiting?: No Has the patient recently lost weight without trying?: 0 Has the patient been eating poorly because of a decreased appetite?: 0 Malnutrition Screening Tool Score: 0    Physical Exam Vitals and nursing note reviewed.  Constitutional:      General: She is not in acute distress.    Appearance: Normal appearance. She is not ill-appearing.  HENT:     Head: Normocephalic and atraumatic.     Nose: Nose normal.  Eyes:     General:        Right eye: No discharge.        Left eye: No discharge.     Conjunctiva/sclera: Conjunctivae normal.  Cardiovascular:     Rate and Rhythm: Tachycardia present.  Pulmonary:     Effort: Pulmonary effort is normal. No respiratory distress.  Musculoskeletal:        General: Normal range of motion.     Cervical back: Normal range of motion.  Skin:    General: Skin is warm and dry.  Neurological:     General: No focal deficit present.     Mental Status: She is alert and oriented to person, place, and time. Mental status is at baseline.  Psychiatric:        Attention and Perception: Attention and perception normal.        Mood and Affect:  Mood is depressed. Affect is labile, angry and tearful.        Behavior: Behavior is agitated.        Thought Content: Thought content is paranoid. Thought content is not delusional. Thought content includes suicidal ideation. Thought content does not include homicidal ideation. Thought content includes suicidal plan. Thought content does not include homicidal plan.        Cognition and Memory: Cognition and memory normal.         Judgment: Judgment normal.     Comments: Speech: Increased volume, patient yelling throughout assessment    Review of Systems  Constitutional: Negative.   HENT: Negative.    Eyes: Negative.   Respiratory: Negative.    Cardiovascular: Negative.   Gastrointestinal: Negative.   Genitourinary: Negative.   Musculoskeletal: Negative.   Skin: Negative.   Neurological: Negative.   Endo/Heme/Allergies: Negative.   Psychiatric/Behavioral:  Positive for depression and suicidal ideas. Negative for hallucinations, memory loss and substance abuse. The patient has insomnia. The patient is not nervous/anxious.     Blood pressure (!) 145/101, pulse (!) 102, temperature 98.4 F (36.9 C), temperature source Oral, resp. rate 20, last menstrual period 08/30/2022, SpO2 99 %. There is no height or weight on file to calculate BMI.  Past Psychiatric History: Bipolar 1 disorder, generalized anxiety disorder, major depressive disorder, suicidal ideation, impulse control disorder, schizoaffective disorder, schizophrenia, disorganized, adjustment disorder with emotional disturbance, affective psychosis, borderline intellectual functioning, recurrent suicide attempts, and numerous inpatient admissions   Is the patient at risk to self? Yes  Has the patient been a risk to self in the past 6 months? No .    Has the patient been a risk to self within the distant past? No   Is the patient a risk to others? Yes   Has the patient been a risk to others in the past 6 months? No   Has the patient been a risk to others within the distant past? No   Past Medical History:  Past Medical History:  Diagnosis Date   Anxiety    Bipolar 1 disorder (HCC)    Cognitive deficits    Depression    Diabetes mellitus without complication (HCC)    Hypertension    Mental disorder    Mental health disorder    Obesity    Family History:  Family History  Problem Relation Age of Onset   Hypertension Mother    Diabetes Father     Social History:  Social History   Tobacco Use   Smoking status: Every Day    Packs/day: 1    Types: Cigars, Cigarettes   Smokeless tobacco: Never   Tobacco comments:    Pt declined  Vaping Use   Vaping Use: Never used  Substance Use Topics   Alcohol use: Not Currently   Drug use: Not Currently    Types: "Crack" cocaine, Other-see comments    Comment: Patient reports hx of smoking Crack   Last Labs:  Admission on 07/16/2022, Discharged on 07/18/2022  Component Date Value Ref Range Status   Sodium 07/16/2022 138  135 - 145 mmol/L Final   Potassium 07/16/2022 3.5  3.5 - 5.1 mmol/L Final   Chloride 07/16/2022 104  98 - 111 mmol/L Final   CO2 07/16/2022 28  22 - 32 mmol/L Final   Glucose, Bld 07/16/2022 193 (H)  70 - 99 mg/dL Final   Glucose reference range applies only to samples taken after fasting for at least 8 hours.  BUN 07/16/2022 <5 (L)  6 - 20 mg/dL Final   Creatinine, Ser 07/16/2022 0.85  0.44 - 1.00 mg/dL Final   Calcium 16/01/9603 9.3  8.9 - 10.3 mg/dL Final   Total Protein 54/12/8117 7.6  6.5 - 8.1 g/dL Final   Albumin 14/78/2956 3.9  3.5 - 5.0 g/dL Final   AST 21/30/8657 38  15 - 41 U/L Final   ALT 07/16/2022 40  0 - 44 U/L Final   Alkaline Phosphatase 07/16/2022 55  38 - 126 U/L Final   Total Bilirubin 07/16/2022 0.5  0.3 - 1.2 mg/dL Final   GFR, Estimated 07/16/2022 >60  >60 mL/min Final   Comment: (NOTE) Calculated using the CKD-EPI Creatinine Equation (2021)    Anion gap 07/16/2022 6  5 - 15 Final   Performed at [facility], [address] [place], [place] [address]   Alcohol, Ethyl (B) 07/16/2022 <10  <10 mg/dL Final   Comment: (NOTE) Lowest detectable limit for serum alcohol is 10 mg/dL.  For medical purposes only. Performed at [facility], [address], [place], [place] [address]    Opiates 07/16/2022 NONE DETECTED  NONE DETECTED Final   Cocaine 07/16/2022 NONE DETECTED  NONE DETECTED Final   Benzodiazepines 07/16/2022 NONE  DETECTED  NONE DETECTED Final   Amphetamines 07/16/2022 NONE DETECTED  NONE DETECTED Final   Tetrahydrocannabinol 07/16/2022 NONE DETECTED  NONE DETECTED Final   Barbiturates 07/16/2022 NONE DETECTED  NONE DETECTED Final   Comment: (NOTE) DRUG SCREEN FOR MEDICAL PURPOSES ONLY.  IF CONFIRMATION IS NEEDED FOR ANY PURPOSE, NOTIFY LAB WITHIN 5 DAYS.  LOWEST DETECTABLE LIMITS FOR URINE DRUG SCREEN Drug Class                     Cutoff (ng/mL) Amphetamine and metabolites    1000 Barbiturate and metabolites    200 Benzodiazepine                 200 Opiates and metabolites        300 Cocaine and metabolites        300 THC                            50 Performed at [facility], [address], [place], [place] [address]    WBC 07/16/2022 10.4  4.0 - 10.5 K/uL Final   RBC 07/16/2022 4.80  3.87 - 5.11 MIL/uL Final   Hemoglobin 07/16/2022 11.4 (L)  12.0 - 15.0 g/dL Final   HCT 40/01/2724 36.9  36.0 - 46.0 % Final   MCV 07/16/2022 76.9 (L)  80.0 - 100.0 fL Final   MCH 07/16/2022 23.8 (L)  26.0 - 34.0 pg Final   MCHC 07/16/2022 30.9  30.0 - 36.0 g/dL Final   RDW 36/64/4034 16.5 (H)  11.5 - 15.5 % Final   Platelets 07/16/2022 310  150 - 400 K/uL Final   nRBC 07/16/2022 0.0  0.0 - 0.2 % Final   Neutrophils Relative % 07/16/2022 60  % Final   Neutro Abs 07/16/2022 6.2  1.7 - 7.7 K/uL Final   Lymphocytes Relative 07/16/2022 33  % Final   Lymphs Abs 07/16/2022 3.4  0.7 - 4.0 K/uL Final   Monocytes Relative 07/16/2022 5  % Final   Monocytes Absolute 07/16/2022 0.6  0.1 - 1.0 K/uL Final   Eosinophils Relative 07/16/2022 2  % Final   Eosinophils Absolute 07/16/2022 0.2  0.0 - 0.5 K/uL Final   Basophils Relative 07/16/2022 0  % Final   Basophils Absolute 07/16/2022 0.0  0.0 - 0.1 K/uL Final   Immature Granulocytes 07/16/2022 0  % Final   Abs Immature Granulocytes 07/16/2022 0.04  0.00 - 0.07 K/uL Final   Performed at [facility], [address] [place], [place] [address]   I-stat  hCG, quantitative 07/16/2022 <5.0  <5 mIU/mL Final   Comment 3 07/16/2022          Final   Comment:   GEST. AGE      CONC.  (mIU/mL)   <=1 WEEK        5 - 50     2 WEEKS       50 - 500     3 WEEKS       100 - 10,000     4 WEEKS     1,000 - 30,000        FEMALE AND NON-PREGNANT FEMALE:     LESS THAN 5 mIU/mL    Acetaminophen (Tylenol), Serum 07/16/2022 <10 (L)  10 - 30 ug/mL Final   Comment: (NOTE) Therapeutic concentrations vary significantly. A range of 10-30 ug/mL  may be an effective concentration for many patients. However, some  are best treated at concentrations outside of this range. Acetaminophen concentrations >150 ug/mL at 4 hours after ingestion  and >50 ug/mL at 12 hours after ingestion are often associated with  toxic reactions.  Performed at [facility], [address], [place], [place] [address]    Salicylate Lvl 07/16/2022 <7.0 (L)  7.0 - 30.0 mg/dL Final   Performed at [facility], [address], [place], [place] [address]   Color, Urine 07/16/2022 YELLOW  YELLOW Final   APPearance 07/16/2022 CLOUDY (A)  CLEAR Final   Specific Gravity, Urine 07/16/2022 >1.030 (H)  1.005 - 1.030 Final   pH 07/16/2022 6.0  5.0 - 8.0 Final   Glucose, UA 07/16/2022 100 (A)  NEGATIVE mg/dL Final   Hgb urine dipstick 07/16/2022 NEGATIVE  NEGATIVE Final   Bilirubin Urine 07/16/2022 SMALL (A)  NEGATIVE Final   Ketones, ur 07/16/2022 NEGATIVE  NEGATIVE mg/dL Final   Protein, ur 91/47/8295 100 (A)  NEGATIVE mg/dL Final   Nitrite 62/13/0865 NEGATIVE  NEGATIVE Final   Leukocytes,Ua 07/16/2022 NEGATIVE  NEGATIVE Final   Performed at [facility], [address], [place], [place] [address]   RPR Ser Ql 07/16/2022 NON REACTIVE  NON REACTIVE Final   Performed at [facility], [address], [place], [place] [address]   Lithium Lvl 07/16/2022 0.06 (L)  0.60 - 1.20 mmol/L Final   Performed at [facility], [address], [place], [place] [address]   HIV Screen  4th Generation wRfx 07/16/2022 Non Reactive  Non Reactive Final   Performed at [facility], [address] [place], [place] [address]   Neisseria Gonorrhea 07/16/2022 Negative   Final   Chlamydia 07/16/2022 Negative   Final   Comment 07/16/2022 Normal Reference Ranger Chlamydia - Negative   Final   Comment 07/16/2022 Normal Reference Range Neisseria Gonorrhea - Negative   Final   Yeast Wet Prep HPF POC 07/16/2022 NONE SEEN  NONE SEEN Final   Trich, Wet Prep 07/16/2022 NONE SEEN  NONE SEEN Final   Clue Cells Wet Prep HPF POC 07/16/2022 NONE SEEN  NONE SEEN Final   WBC, Wet Prep HPF POC 07/16/2022 <  10  <10 Final   Sperm 07/16/2022 NONE SEEN   Final   Performed at [facility], [address], [place], [place] [address]   RBC / HPF 07/16/2022 0-5  0 - 5 RBC/hpf Final   WBC, UA 07/16/2022 0-5  0 - 5 WBC/hpf Final   Bacteria, UA 07/16/2022 MANY (A)  NONE SEEN Final   Squamous Epithelial / HPF 07/16/2022 0-5  0 - 5 /HPF Final   Mucus 07/16/2022 PRESENT   Final   Performed at [facility], [address], [place], [place] [address]   Specimen Description 07/16/2022 URINE, CLEAN CATCH   Final   Special Requests 07/16/2022    Final                   Value:NONE Performed at [facility], [address], [place], [place] [address]    Culture 07/16/2022 MULTIPLE SPECIES PRESENT, SUGGEST RECOLLECTION (A)   Final   Report Status 07/16/2022 07/18/2022 FINAL   Final   Glucose-Capillary 07/17/2022 227 (H)  70 - 99 mg/dL Final   Glucose reference range applies only to samples taken after fasting for at least 8 hours.   SARS Coronavirus 2 by RT PCR 07/17/2022 NEGATIVE  NEGATIVE Final   Influenza A by PCR 07/17/2022 NEGATIVE  NEGATIVE Final   Influenza B by PCR 07/17/2022 NEGATIVE  NEGATIVE Final   Comment: (NOTE) The Xpert Xpress SARS-CoV-2/FLU/RSV plus assay is intended as an aid in the diagnosis of influenza from Nasopharyngeal swab specimens and should not be used as a sole  basis for treatment. Nasal washings and aspirates are unacceptable for Xpert Xpress SARS-CoV-2/FLU/RSV testing.  Fact Sheet for Patients: BloggerCourse.com  Fact Sheet for Healthcare Providers: SeriousBroker.it  This test is not yet approved or cleared by the [place] FDA and has been authorized for detection and/or diagnosis of SARS-CoV-2 by FDA under an Emergency Use Authorization (EUA). This EUA will remain in effect (meaning this test can be used) for the duration of the COVID-19 declaration under Section 564(b)(1) of the Act, 21 U.S.C. section 360bbb-3(b)(1), unless the authorization is terminated or revoked.     Resp Syncytial Virus by PCR 07/17/2022 NEGATIVE  NEGATIVE Final   Comment: (NOTE) Fact Sheet for Patients: BloggerCourse.com  Fact Sheet for Healthcare Providers: SeriousBroker.it  This test is not yet approved or cleared by the [place] FDA and has been authorized for detection and/or diagnosis of SARS-CoV-2 by FDA under an Emergency Use Authorization (EUA). This EUA will remain in effect (meaning this test can be used) for the duration of the COVID-19 declaration under Section 564(b)(1) of the Act, 21 U.S.C. section 360bbb-3(b)(1), unless the authorization is terminated or revoked.  Performed at [facility], [address], [place], [place] [address]    Glucose-Capillary 07/17/2022 266 (H)  70 - 99 mg/dL Final   Glucose reference range applies only to samples taken after fasting for at least 8 hours.   Glucose-Capillary 07/17/2022 225 (H)  70 - 99 mg/dL Final   Glucose reference range applies only to samples taken after fasting for at least 8 hours.   Glucose-Capillary 07/17/2022 244 (H)  70 - 99 mg/dL Final   Glucose reference range applies only to samples taken after fasting for at least 8 hours.   Comment 1 07/17/2022 Notify RN   Final    Comment 2 07/17/2022 Document in Chart   Final   Glucose-Capillary 07/18/2022 217 (H)  70 -  99 mg/dL Final   Glucose reference range applies only to samples taken after fasting for at least 8 hours.  Admission on 07/09/2022, Discharged on 07/09/2022  Component Date Value Ref Range Status   SARS Coronavirus 2 by RT PCR 07/09/2022 NEGATIVE  NEGATIVE Final   Comment: (NOTE) SARS-CoV-2 target nucleic acids are NOT DETECTED.  The SARS-CoV-2 RNA is generally detectable in upper respiratory specimens during the acute phase of infection. The lowest concentration of SARS-CoV-2 viral copies this assay can detect is 138 copies/mL. A negative result does not preclude SARS-Cov-2 infection and should not be used as the sole basis for treatment or other patient management decisions. A negative result may occur with  improper specimen collection/handling, submission of specimen other than nasopharyngeal swab, presence of viral mutation(s) within the areas targeted by this assay, and inadequate number of viral copies(<138 copies/mL). A negative result must be combined with clinical observations, patient history, and epidemiological information. The expected result is Negative.  Fact Sheet for Patients:  BloggerCourse.com  Fact Sheet for Healthcare Providers:  SeriousBroker.it  This test is no                          t yet approved or cleared by the [place] FDA and  has been authorized for detection and/or diagnosis of SARS-CoV-2 by FDA under an Emergency Use Authorization (EUA). This EUA will remain  in effect (meaning this test can be used) for the duration of the COVID-19 declaration under Section 564(b)(1) of the Act, 21 U.S.C.section 360bbb-3(b)(1), unless the authorization is terminated  or revoked sooner.       Influenza A by PCR 07/09/2022 NEGATIVE  NEGATIVE Final   Influenza B by PCR 07/09/2022 POSITIVE (A)  NEGATIVE Final    Comment: (NOTE) The Xpert Xpress SARS-CoV-2/FLU/RSV plus assay is intended as an aid in the diagnosis of influenza from Nasopharyngeal swab specimens and should not be used as a sole basis for treatment. Nasal washings and aspirates are unacceptable for Xpert Xpress SARS-CoV-2/FLU/RSV testing.  Fact Sheet for Patients: BloggerCourse.com  Fact Sheet for Healthcare Providers: SeriousBroker.it  This test is not yet approved or cleared by the [place] FDA and has been authorized for detection and/or diagnosis of SARS-CoV-2 by FDA under an Emergency Use Authorization (EUA). This EUA will remain in effect (meaning this test can be used) for the duration of the COVID-19 declaration under Section 564(b)(1) of the Act, 21 U.S.C. section 360bbb-3(b)(1), unless the authorization is terminated or revoked.     Resp Syncytial Virus by PCR 07/09/2022 NEGATIVE  NEGATIVE Final   Comment: (NOTE) Fact Sheet for Patients: BloggerCourse.com  Fact Sheet for Healthcare Providers: SeriousBroker.it  This test is not yet approved or cleared by the [place] FDA and has been authorized for detection and/or diagnosis of SARS-CoV-2 by FDA under an Emergency Use Authorization (EUA). This EUA will remain in effect (meaning this test can be used) for the duration of the COVID-19 declaration under Section 564(b)(1) of the Act, 21 U.S.C. section 360bbb-3(b)(1), unless the authorization is terminated or revoked.  Performed at [facility], [address], [place], [place] [address]    Sodium 07/09/2022 135  135 - 145 mmol/L Final   Potassium 07/09/2022 3.7  3.5 - 5.1 mmol/L Final   Chloride 07/09/2022 96 (L)  98 - 111 mmol/L Final   CO2 07/09/2022 25  22 - 32 mmol/L Final   Glucose, Bld 07/09/2022 347 (H)  70 - 99 mg/dL Final   Glucose reference range applies only to samples taken  after fasting for at least 8 hours.   BUN 07/09/2022 6  6 - 20 mg/dL Final   Creatinine, Ser 07/09/2022 1.13 (H)  0.44 - 1.00 mg/dL Final   Calcium 16/01/9603 9.2  8.9 - 10.3 mg/dL Final   Total Protein 54/12/8117 9.0 (H)  6.5 - 8.1 g/dL Final   Albumin 14/78/2956 4.6  3.5 - 5.0 g/dL Final   AST 21/30/8657 34  15 - 41 U/L Final   ALT 07/09/2022 27  0 - 44 U/L Final   Alkaline Phosphatase 07/09/2022 55  38 - 126 U/L Final   Total Bilirubin 07/09/2022 0.3  0.3 - 1.2 mg/dL Final   GFR, Estimated 07/09/2022 >60  >60 mL/min Final   Comment: (NOTE) Calculated using the CKD-EPI Creatinine Equation (2021)    Anion gap 07/09/2022 14  5 - 15 Final   Performed at [facility], [address], [place], [place] [address]   Alcohol, Ethyl (B) 07/09/2022 <10  <10 mg/dL Final   Comment: (NOTE) Lowest detectable limit for serum alcohol is 10 mg/dL.  For medical purposes only. Performed at [facility], [address], [place], [place] [address]    Salicylate Lvl 07/09/2022 <7.0 (L)  7.0 - 30.0 mg/dL Final   Performed at [facility], [address], [place], [place] [address]   Acetaminophen (Tylenol), Serum 07/09/2022 <10 (L)  10 - 30 ug/mL Final   Comment: (NOTE) Therapeutic concentrations vary significantly. A range of 10-30 ug/mL  may be an effective concentration for many patients. However, some  are best treated at concentrations outside of this range. Acetaminophen concentrations >150 ug/mL at 4 hours after ingestion  and >50 ug/mL at 12 hours after ingestion are often associated with  toxic reactions.  Performed at [facility], [address] [place], [place] [address]    WBC 07/09/2022 6.9  4.0 - 10.5 K/uL Final   RBC 07/09/2022 5.42 (H)  3.87 - 5.11 MIL/uL Final   Hemoglobin 07/09/2022 12.8  12.0 - 15.0 g/dL Final   HCT 72/53/6644 42.2  36.0 - 46.0 % Final   MCV 07/09/2022 77.9 (L)  80.0 - 100.0 fL Final    MCH 07/09/2022 23.6 (L)  26.0 - 34.0 pg Final   MCHC 07/09/2022 30.3  30.0 - 36.0 g/dL Final   RDW 03/47/4259 16.7 (H)  11.5 - 15.5 % Final   Platelets 07/09/2022 219  150 - 400 K/uL Final   Comment: SPECIMEN CHECKED FOR CLOTS REPEATED TO VERIFY    nRBC 07/09/2022 0.0  0.0 - 0.2 % Final   Performed at [facility], [address] [place], [place] [address]   I-stat hCG, quantitative 07/09/2022 <5.0  <5 mIU/mL Final   Comment 3 07/09/2022          Final   Comment:   GEST. AGE      CONC.  (mIU/mL)   <=1 WEEK        5 - 50     2 WEEKS       50 - 500     3 WEEKS       100 - 10,000     4 WEEKS     1,000 - 30,000        FEMALE AND NON-PREGNANT FEMALE:     LESS THAN 5 mIU/mL   Admission on 07/05/2022, Discharged on  07/06/2022  Component Date Value Ref Range Status   WBC 07/05/2022 11.6 (H)  4.0 - 10.5 K/uL Final   RBC 07/05/2022 4.67  3.87 - 5.11 MIL/uL Final   Hemoglobin 07/05/2022 11.3 (L)  12.0 - 15.0 g/dL Final   HCT 82/95/6213 38.4  36.0 - 46.0 % Final   MCV 07/05/2022 82.2  80.0 - 100.0 fL Final   MCH 07/05/2022 24.2 (L)  26.0 - 34.0 pg Final   MCHC 07/05/2022 29.4 (L)  30.0 - 36.0 g/dL Final   RDW 08/65/7846 16.6 (H)  11.5 - 15.5 % Final   Platelets 07/05/2022 221  150 - 400 K/uL Final   nRBC 07/05/2022 0.0  0.0 - 0.2 % Final   Performed at [facility], [address] [place], [place] [address]   Sodium 07/05/2022 138  135 - 145 mmol/L Final   Potassium 07/05/2022 3.9  3.5 - 5.1 mmol/L Final   HEMOLYSIS AT THIS LEVEL MAY AFFECT RESULT   Chloride 07/05/2022 106  98 - 111 mmol/L Final   CO2 07/05/2022 26  22 - 32 mmol/L Final   Glucose, Bld 07/05/2022 172 (H)  70 - 99 mg/dL Final   Glucose reference range applies only to samples taken after fasting for at least 8 hours.   BUN 07/05/2022 6  6 - 20 mg/dL Final   Creatinine, Ser 07/05/2022 0.73  0.44 - 1.00 mg/dL Final   Calcium 28/41/3244 9.4  8.9 - 10.3 mg/dL Final   GFR, Estimated  07/05/2022 >60  >60 mL/min Final   Comment: (NOTE) Calculated using the CKD-EPI Creatinine Equation (2021)    Anion gap 07/05/2022 6  5 - 15 Final   Performed at [facility], [address] [place], [place] [address]   Alcohol, Ethyl (B) 07/05/2022 <10  <10 mg/dL Final   Comment: (NOTE) Lowest detectable limit for serum alcohol is 10 mg/dL.  For medical purposes only. Performed at [facility], [address], [place], [place] [address]    Acetaminophen (Tylenol), Serum 07/05/2022 <10 (L)  10 - 30 ug/mL Final   Comment: (NOTE) Therapeutic concentrations vary significantly. A range of 10-30 ug/mL  may be an effective concentration for many patients. However, some  are best treated at concentrations outside of this range. Acetaminophen concentrations >150 ug/mL at 4 hours after ingestion  and >50 ug/mL at 12 hours after ingestion are often associated with  toxic reactions.  Performed at [facility], [address] [place], [place] [address]    Salicylate Lvl 07/05/2022 <7.0 (L)  7.0 - 30.0 mg/dL Final   Performed at [facility], [address], [place], [place] [address]   Opiates 07/05/2022 NONE DETECTED  NONE DETECTED Final   Cocaine 07/05/2022 NONE DETECTED  NONE DETECTED Final   Benzodiazepines 07/05/2022 NONE DETECTED  NONE DETECTED Final   Amphetamines 07/05/2022 NONE DETECTED  NONE DETECTED Final   Tetrahydrocannabinol 07/05/2022 NONE DETECTED  NONE DETECTED Final   Barbiturates 07/05/2022 NONE DETECTED  NONE DETECTED Final   Comment: (NOTE) DRUG SCREEN FOR MEDICAL PURPOSES ONLY.  IF CONFIRMATION IS NEEDED FOR ANY PURPOSE, NOTIFY LAB WITHIN 5 DAYS.  LOWEST DETECTABLE LIMITS FOR URINE DRUG SCREEN Drug Class                     Cutoff (ng/mL) Amphetamine and metabolites    1000 Barbiturate and metabolites    200 Benzodiazepine  200 Opiates and metabolites         300 Cocaine and metabolites        300 THC                            50 Performed at [facility], [address], [place], [place] [address]    SARS Coronavirus 2 by RT PCR 07/05/2022 NEGATIVE  NEGATIVE Final   Comment: (NOTE) SARS-CoV-2 target nucleic acids are NOT DETECTED.  The SARS-CoV-2 RNA is generally detectable in upper respiratory specimens during the acute phase of infection. The lowest concentration of SARS-CoV-2 viral copies this assay can detect is 138 copies/mL. A negative result does not preclude SARS-Cov-2 infection and should not be used as the sole basis for treatment or other patient management decisions. A negative result may occur with  improper specimen collection/handling, submission of specimen other than nasopharyngeal swab, presence of viral mutation(s) within the areas targeted by this assay, and inadequate number of viral copies(<138 copies/mL). A negative result must be combined with clinical observations, patient history, and epidemiological information. The expected result is Negative.  Fact Sheet for Patients:  BloggerCourse.com  Fact Sheet for Healthcare Providers:  SeriousBroker.it  This test is no                          t yet approved or cleared by the [place] FDA and  has been authorized for detection and/or diagnosis of SARS-CoV-2 by FDA under an Emergency Use Authorization (EUA). This EUA will remain  in effect (meaning this test can be used) for the duration of the COVID-19 declaration under Section 564(b)(1) of the Act, 21 U.S.C.section 360bbb-3(b)(1), unless the authorization is terminated  or revoked sooner.       Influenza A by PCR 07/05/2022 NEGATIVE  NEGATIVE Final   Influenza B by PCR 07/05/2022 POSITIVE (A)  NEGATIVE Final   Comment: (NOTE) The Xpert Xpress SARS-CoV-2/FLU/RSV plus assay is intended as an aid in the diagnosis of influenza from  Nasopharyngeal swab specimens and should not be used as a sole basis for treatment. Nasal washings and aspirates are unacceptable for Xpert Xpress SARS-CoV-2/FLU/RSV testing.  Fact Sheet for Patients: BloggerCourse.com  Fact Sheet for Healthcare Providers: SeriousBroker.it  This test is not yet approved or cleared by the [place] FDA and has been authorized for detection and/or diagnosis of SARS-CoV-2 by FDA under an Emergency Use Authorization (EUA). This EUA will remain in effect (meaning this test can be used) for the duration of the COVID-19 declaration under Section 564(b)(1) of the Act, 21 U.S.C. section 360bbb-3(b)(1), unless the authorization is terminated or revoked.     Resp Syncytial Virus by PCR 07/05/2022 NEGATIVE  NEGATIVE Final   Comment: (NOTE) Fact Sheet for Patients: BloggerCourse.com  Fact Sheet for Healthcare Providers: SeriousBroker.it  This test is not yet approved or cleared by the [place] FDA and has been authorized for detection and/or diagnosis of SARS-CoV-2 by FDA under an Emergency Use Authorization (EUA). This EUA will remain in effect (meaning this test can be used) for the duration of the COVID-19 declaration under Section 564(b)(1) of the Act, 21 U.S.C. section 360bbb-3(b)(1), unless the authorization is terminated or revoked.  Performed at [facility], [address] [place], [place] [address]   Admission on 07/03/2022, Discharged on 07/03/2022  Component Date Value Ref Range Status   SARS Coronavirus 2 by  RT PCR 07/03/2022 NEGATIVE  NEGATIVE Final   Influenza A by PCR 07/03/2022 NEGATIVE  NEGATIVE Final   Influenza B by PCR 07/03/2022 NEGATIVE  NEGATIVE Final   Comment: (NOTE) The Xpert Xpress SARS-CoV-2/FLU/RSV plus assay is intended as an aid in the diagnosis of influenza from Nasopharyngeal swab specimens  and should not be used as a sole basis for treatment. Nasal washings and aspirates are unacceptable for Xpert Xpress SARS-CoV-2/FLU/RSV testing.  Fact Sheet for Patients: BloggerCourse.com  Fact Sheet for Healthcare Providers: SeriousBroker.it  This test is not yet approved or cleared by the [place] FDA and has been authorized for detection and/or diagnosis of SARS-CoV-2 by FDA under an Emergency Use Authorization (EUA). This EUA will remain in effect (meaning this test can be used) for the duration of the COVID-19 declaration under Section 564(b)(1) of the Act, 21 U.S.C. section 360bbb-3(b)(1), unless the authorization is terminated or revoked.     Resp Syncytial Virus by PCR 07/03/2022 NEGATIVE  NEGATIVE Final   Comment: (NOTE) Fact Sheet for Patients: BloggerCourse.com  Fact Sheet for Healthcare Providers: SeriousBroker.it  This test is not yet approved or cleared by the [place] FDA and has been authorized for detection and/or diagnosis of SARS-CoV-2 by FDA under an Emergency Use Authorization (EUA). This EUA will remain in effect (meaning this test can be used) for the duration of the COVID-19 declaration under Section 564(b)(1) of the Act, 21 U.S.C. section 360bbb-3(b)(1), unless the authorization is terminated or revoked.  Performed at [facility], [address] [place], [place] [address]    POC Amphetamine UR 07/03/2022 None Detected  NONE DETECTED (Cut Off Level 1000 ng/mL) Final   POC Secobarbital (BAR) 07/03/2022 None Detected  NONE DETECTED (Cut Off Level 300 ng/mL) Final   POC Buprenorphine (BUP) 07/03/2022 None Detected  NONE DETECTED (Cut Off Level 10 ng/mL) Final   POC Oxazepam (BZO) 07/03/2022 None Detected  NONE DETECTED (Cut Off Level 300 ng/mL) Final   POC Cocaine UR 07/03/2022 None Detected  NONE DETECTED (Cut Off Level 300 ng/mL) Final    POC Methamphetamine UR 07/03/2022 None Detected  NONE DETECTED (Cut Off Level 1000 ng/mL) Final   POC Morphine 07/03/2022 None Detected  NONE DETECTED (Cut Off Level 300 ng/mL) Final   POC Methadone UR 07/03/2022 None Detected  NONE DETECTED (Cut Off Level 300 ng/mL) Final   POC Oxycodone UR 07/03/2022 None Detected  NONE DETECTED (Cut Off Level 100 ng/mL) Final   POC Marijuana UR 07/03/2022 None Detected  NONE DETECTED (Cut Off Level 50 ng/mL) Final   Preg Test, Ur 07/03/2022 Negative  Negative Final   SARSCOV2ONAVIRUS 2 AG 07/03/2022 NEGATIVE  NEGATIVE Final   Comment: (NOTE) SARS-CoV-2 antigen NOT DETECTED.   Negative results are presumptive.  Negative results do not preclude SARS-CoV-2 infection and should not be used as the sole basis for treatment or other patient management decisions, including infection  control decisions, particularly in the presence of clinical signs and  symptoms consistent with COVID-19, or in those who have been in contact with the virus.  Negative results must be combined with clinical observations, patient history, and epidemiological information. The expected result is Negative.  Fact Sheet for Patients: https://www.jennings-kim.com/  Fact Sheet for Healthcare Providers: https://alexander-rogers.biz/  This test is not yet approved or cleared by the [place] FDA and  has been authorized for detection and/or diagnosis of SARS-CoV-2 by FDA under an Emergency Use Authorization (EUA).  This EUA will remain in effect (meaning  this test can be used) for the duration of  the COV                          ID-19 declaration under Section 564(b)(1) of the Act, 21 U.S.C. section 360bbb-3(b)(1), unless the authorization is terminated or revoked sooner.     Preg Test, Ur 07/03/2022 NEGATIVE  NEGATIVE Final   Comment:        THE SENSITIVITY OF THIS METHODOLOGY IS >24 mIU/mL    Glucose-Capillary 07/03/2022 280 (H)  70 - 99 mg/dL  Final   Glucose reference range applies only to samples taken after fasting for at least 8 hours.  Admission on 06/30/2022, Discharged on 07/01/2022  Component Date Value Ref Range Status   Sodium 06/30/2022 139  135 - 145 mmol/L Final   Potassium 06/30/2022 3.1 (L)  3.5 - 5.1 mmol/L Final   Chloride 06/30/2022 106  98 - 111 mmol/L Final   CO2 06/30/2022 26  22 - 32 mmol/L Final   Glucose, Bld 06/30/2022 219 (H)  70 - 99 mg/dL Final   Glucose reference range applies only to samples taken after fasting for at least 8 hours.   BUN 06/30/2022 6  6 - 20 mg/dL Final   Creatinine, Ser 06/30/2022 0.81  0.44 - 1.00 mg/dL Final   Calcium 91/47/8295 8.8 (L)  8.9 - 10.3 mg/dL Final   Total Protein 62/13/0865 7.9  6.5 - 8.1 g/dL Final   Albumin 78/46/9629 4.2  3.5 - 5.0 g/dL Final   AST 52/84/1324 25  15 - 41 U/L Final   ALT 06/30/2022 23  0 - 44 U/L Final   Alkaline Phosphatase 06/30/2022 60  38 - 126 U/L Final   Total Bilirubin 06/30/2022 0.6  0.3 - 1.2 mg/dL Final   GFR, Estimated 06/30/2022 >60  >60 mL/min Final   Comment: (NOTE) Calculated using the CKD-EPI Creatinine Equation (2021)    Anion gap 06/30/2022 7  5 - 15 Final   Performed at [facility], [address], [place], [place] [address]   Alcohol, Ethyl (B) 06/30/2022 <10  <10 mg/dL Final   Comment: (NOTE) Lowest detectable limit for serum alcohol is 10 mg/dL.  For medical purposes only. Performed at [facility], [address] [place], [place] [address]    Opiates 06/30/2022 NONE DETECTED  NONE DETECTED Final   Cocaine 06/30/2022 NONE DETECTED  NONE DETECTED Final   Benzodiazepines 06/30/2022 NONE DETECTED  NONE DETECTED Final   Amphetamines 06/30/2022 NONE DETECTED  NONE DETECTED Final   Tetrahydrocannabinol 06/30/2022 NONE DETECTED  NONE DETECTED Final   Barbiturates 06/30/2022 NONE DETECTED  NONE DETECTED Final   Comment: (NOTE) DRUG SCREEN FOR MEDICAL PURPOSES ONLY.  IF  CONFIRMATION IS NEEDED FOR ANY PURPOSE, NOTIFY LAB WITHIN 5 DAYS.  LOWEST DETECTABLE LIMITS FOR URINE DRUG SCREEN Drug Class                     Cutoff (ng/mL) Amphetamine and metabolites    1000 Barbiturate and metabolites    200 Benzodiazepine                 200 Opiates and metabolites        300 Cocaine and metabolites        300 THC  50 Performed at [facility], [address] [place], [place] [address]    WBC 06/30/2022 9.6  4.0 - 10.5 K/uL Final   RBC 06/30/2022 4.55  3.87 - 5.11 MIL/uL Final   Hemoglobin 06/30/2022 10.8 (L)  12.0 - 15.0 g/dL Final   HCT 40/98/1191 35.6 (L)  36.0 - 46.0 % Final   MCV 06/30/2022 78.2 (L)  80.0 - 100.0 fL Final   MCH 06/30/2022 23.7 (L)  26.0 - 34.0 pg Final   MCHC 06/30/2022 30.3  30.0 - 36.0 g/dL Final   RDW 47/82/9562 16.4 (H)  11.5 - 15.5 % Final   Platelets 06/30/2022 242  150 - 400 K/uL Final   nRBC 06/30/2022 0.0  0.0 - 0.2 % Final   Neutrophils Relative % 06/30/2022 62  % Final   Neutro Abs 06/30/2022 5.9  1.7 - 7.7 K/uL Final   Lymphocytes Relative 06/30/2022 29  % Final   Lymphs Abs 06/30/2022 2.8  0.7 - 4.0 K/uL Final   Monocytes Relative 06/30/2022 5  % Final   Monocytes Absolute 06/30/2022 0.5  0.1 - 1.0 K/uL Final   Eosinophils Relative 06/30/2022 3  % Final   Eosinophils Absolute 06/30/2022 0.3  0.0 - 0.5 K/uL Final   Basophils Relative 06/30/2022 1  % Final   Basophils Absolute 06/30/2022 0.1  0.0 - 0.1 K/uL Final   Immature Granulocytes 06/30/2022 0  % Final   Abs Immature Granulocytes 06/30/2022 0.02  0.00 - 0.07 K/uL Final   Performed at [facility], [address], [place], [place] [address]   Group A Strep by PCR 06/30/2022 NOT DETECTED  NOT DETECTED Final   Performed at [facility], [address], [place], [place] [address]   SARS Coronavirus 2 by RT PCR 06/30/2022 NEGATIVE  NEGATIVE Final   Comment: (NOTE) SARS-CoV-2 target  nucleic acids are NOT DETECTED.  The SARS-CoV-2 RNA is generally detectable in upper respiratory specimens during the acute phase of infection. The lowest concentration of SARS-CoV-2 viral copies this assay can detect is 138 copies/mL. A negative result does not preclude SARS-Cov-2 infection and should not be used as the sole basis for treatment or other patient management decisions. A negative result may occur with  improper specimen collection/handling, submission of specimen other than nasopharyngeal swab, presence of viral mutation(s) within the areas targeted by this assay, and inadequate number of viral copies(<138 copies/mL). A negative result must be combined with clinical observations, patient history, and epidemiological information. The expected result is Negative.  Fact Sheet for Patients:  BloggerCourse.com  Fact Sheet for Healthcare Providers:  SeriousBroker.it  This test is no                          t yet approved or cleared by the [place] FDA and  has been authorized for detection and/or diagnosis of SARS-CoV-2 by FDA under an Emergency Use Authorization (EUA). This EUA will remain  in effect (meaning this test can be used) for the duration of the COVID-19 declaration under Section 564(b)(1) of the Act, 21 U.S.C.section 360bbb-3(b)(1), unless the authorization is terminated  or revoked sooner.       Influenza A by PCR 06/30/2022 NEGATIVE  NEGATIVE Final   Influenza B by PCR 06/30/2022 NEGATIVE  NEGATIVE Final   Comment: (NOTE) The Xpert Xpress SARS-CoV-2/FLU/RSV plus assay is intended as an aid in the diagnosis of influenza from Nasopharyngeal swab specimens and should  not be used as a sole basis for treatment. Nasal washings and aspirates are unacceptable for Xpert Xpress SARS-CoV-2/FLU/RSV testing.  Fact Sheet for Patients: BloggerCourse.com  Fact Sheet for Healthcare  Providers: SeriousBroker.it  This test is not yet approved or cleared by the [place] FDA and has been authorized for detection and/or diagnosis of SARS-CoV-2 by FDA under an Emergency Use Authorization (EUA). This EUA will remain in effect (meaning this test can be used) for the duration of the COVID-19 declaration under Section 564(b)(1) of the Act, 21 U.S.C. section 360bbb-3(b)(1), unless the authorization is terminated or revoked.     Resp Syncytial Virus by PCR 06/30/2022 NEGATIVE  NEGATIVE Final   Comment: (NOTE) Fact Sheet for Patients: BloggerCourse.com  Fact Sheet for Healthcare Providers: SeriousBroker.it  This test is not yet approved or cleared by the [place] FDA and has been authorized for detection and/or diagnosis of SARS-CoV-2 by FDA under an Emergency Use Authorization (EUA). This EUA will remain in effect (meaning this test can be used) for the duration of the COVID-19 declaration under Section 564(b)(1) of the Act, 21 U.S.C. section 360bbb-3(b)(1), unless the authorization is terminated or revoked.  Performed at [facility], [address] [place], [place] [address]   Admission on 06/19/2022, Discharged on 06/20/2022  Component Date Value Ref Range Status   Sodium 06/19/2022 134 (L)  135 - 145 mmol/L Final   Potassium 06/19/2022 3.4 (L)  3.5 - 5.1 mmol/L Final   Chloride 06/19/2022 106  98 - 111 mmol/L Final   CO2 06/19/2022 23  22 - 32 mmol/L Final   Glucose, Bld 06/19/2022 234 (H)  70 - 99 mg/dL Final   Glucose reference range applies only to samples taken after fasting for at least 8 hours.   BUN 06/19/2022 9  6 - 20 mg/dL Final   Creatinine, Ser 06/19/2022 1.02 (H)  0.44 - 1.00 mg/dL Final   Calcium 63/87/5643 8.8 (L)  8.9 - 10.3 mg/dL Final   Total Protein 32/95/1884 8.0  6.5 - 8.1 g/dL Final   Albumin 16/60/6301 4.1  3.5 - 5.0 g/dL Final   AST  60/01/9322 31  15 - 41 U/L Final   ALT 06/19/2022 22  0 - 44 U/L Final   Alkaline Phosphatase 06/19/2022 62  38 - 126 U/L Final   Total Bilirubin 06/19/2022 0.4  0.3 - 1.2 mg/dL Final   GFR, Estimated 06/19/2022 >60  >60 mL/min Final   Comment: (NOTE) Calculated using the CKD-EPI Creatinine Equation (2021)    Anion gap 06/19/2022 5  5 - 15 Final   Performed at [facility], [address], [place], [place] [address]   Alcohol, Ethyl (B) 06/19/2022 <10  <10 mg/dL Final   Comment: (NOTE) Lowest detectable limit for serum alcohol is 10 mg/dL.  For medical purposes only. Performed at [facility], [address] [place], [place] [address]    Opiates 06/19/2022 NONE DETECTED  NONE DETECTED Final   Cocaine 06/19/2022 NONE DETECTED  NONE DETECTED Final   Benzodiazepines 06/19/2022 NONE DETECTED  NONE DETECTED Final   Amphetamines 06/19/2022 NONE DETECTED  NONE DETECTED Final   Tetrahydrocannabinol 06/19/2022 NONE DETECTED  NONE DETECTED Final   Barbiturates 06/19/2022 NONE DETECTED  NONE DETECTED Final   Comment: (NOTE) DRUG SCREEN FOR MEDICAL PURPOSES ONLY.  IF CONFIRMATION IS NEEDED FOR ANY PURPOSE, NOTIFY LAB WITHIN 5 DAYS.  LOWEST DETECTABLE LIMITS FOR URINE DRUG SCREEN Drug Class  Cutoff (ng/mL) Amphetamine and metabolites    1000 Barbiturate and metabolites    200 Benzodiazepine                 200 Opiates and metabolites        300 Cocaine and metabolites        300 THC                            50 Performed at [facility], [address], [place], [place] [address]    WBC 06/19/2022 12.2 (H)  4.0 - 10.5 K/uL Final   RBC 06/19/2022 4.39  3.87 - 5.11 MIL/uL Final   Hemoglobin 06/19/2022 10.6 (L)  12.0 - 15.0 g/dL Final   HCT 60/45/4098 34.5 (L)  36.0 - 46.0 % Final   MCV 06/19/2022 78.6 (L)  80.0 - 100.0 fL Final   MCH 06/19/2022 24.1 (L)  26.0 - 34.0 pg Final   MCHC 06/19/2022 30.7  30.0 -  36.0 g/dL Final   RDW 11/91/4782 16.2 (H)  11.5 - 15.5 % Final   Platelets 06/19/2022 247  150 - 400 K/uL Final   REPEATED TO VERIFY   nRBC 06/19/2022 0.0  0.0 - 0.2 % Final   Neutrophils Relative % 06/19/2022 56  % Final   Neutro Abs 06/19/2022 6.9  1.7 - 7.7 K/uL Final   Lymphocytes Relative 06/19/2022 35  % Final   Lymphs Abs 06/19/2022 4.2 (H)  0.7 - 4.0 K/uL Final   Monocytes Relative 06/19/2022 6  % Final   Monocytes Absolute 06/19/2022 0.8  0.1 - 1.0 K/uL Final   Eosinophils Relative 06/19/2022 2  % Final   Eosinophils Absolute 06/19/2022 0.3  0.0 - 0.5 K/uL Final   Basophils Relative 06/19/2022 1  % Final   Basophils Absolute 06/19/2022 0.1  0.0 - 0.1 K/uL Final   Immature Granulocytes 06/19/2022 0  % Final   Abs Immature Granulocytes 06/19/2022 0.03  0.00 - 0.07 K/uL Final   Performed at [facility], [address] [place], [place] [address]   Salicylate Lvl 06/19/2022 <7.0 (L)  7.0 - 30.0 mg/dL Final   Performed at [facility], [address] [place], [place] [address]   Acetaminophen (Tylenol), Serum 06/19/2022 <10 (L)  10 - 30 ug/mL Final   Comment: (NOTE) Therapeutic concentrations vary significantly. A range of 10-30 ug/mL  may be an effective concentration for many patients. However, some  are best treated at concentrations outside of this range. Acetaminophen concentrations >150 ug/mL at 4 hours after ingestion  and >50 ug/mL at 12 hours after ingestion are often associated with  toxic reactions.  Performed at [facility], [address], [place], [place] [address]    Lithium Lvl 06/20/2022 0.36 (L)  0.60 - 1.20 mmol/L Final   Performed at [facility], [address] [place], [place] [address]   SARS Coronavirus 2 by RT PCR 06/20/2022 NEGATIVE  NEGATIVE Final   Comment: (NOTE) SARS-CoV-2 target nucleic acids are NOT DETECTED.  The SARS-CoV-2 RNA is generally detectable in upper  respiratory specimens during the acute phase of infection. The lowest concentration of SARS-CoV-2 viral copies this assay can detect is 138 copies/mL. A negative result does not preclude SARS-Cov-2 infection and should not be used as the sole basis for treatment or other patient management decisions. A negative result may occur with  improper specimen collection/handling, submission  of specimen other than nasopharyngeal swab, presence of viral mutation(s) within the areas targeted by this assay, and inadequate number of viral copies(<138 copies/mL). A negative result must be combined with clinical observations, patient history, and epidemiological information. The expected result is Negative.  Fact Sheet for Patients:  BloggerCourse.com  Fact Sheet for Healthcare Providers:  SeriousBroker.it  This test is no                          t yet approved or cleared by the [place] FDA and  has been authorized for detection and/or diagnosis of SARS-CoV-2 by FDA under an Emergency Use Authorization (EUA). This EUA will remain  in effect (meaning this test can be used) for the duration of the COVID-19 declaration under Section 564(b)(1) of the Act, 21 U.S.C.section 360bbb-3(b)(1), unless the authorization is terminated  or revoked sooner.       Influenza A by PCR 06/20/2022 NEGATIVE  NEGATIVE Final   Influenza B by PCR 06/20/2022 NEGATIVE  NEGATIVE Final   Comment: (NOTE) The Xpert Xpress SARS-CoV-2/FLU/RSV plus assay is intended as an aid in the diagnosis of influenza from Nasopharyngeal swab specimens and should not be used as a sole basis for treatment. Nasal washings and aspirates are unacceptable for Xpert Xpress SARS-CoV-2/FLU/RSV testing.  Fact Sheet for Patients: BloggerCourse.com  Fact Sheet for Healthcare Providers: SeriousBroker.it  This test is not yet approved or  cleared by the [place] FDA and has been authorized for detection and/or diagnosis of SARS-CoV-2 by FDA under an Emergency Use Authorization (EUA). This EUA will remain in effect (meaning this test can be used) for the duration of the COVID-19 declaration under Section 564(b)(1) of the Act, 21 U.S.C. section 360bbb-3(b)(1), unless the authorization is terminated or revoked.     Resp Syncytial Virus by PCR 06/20/2022 NEGATIVE  NEGATIVE Final   Comment: (NOTE) Fact Sheet for Patients: BloggerCourse.com  Fact Sheet for Healthcare Providers: SeriousBroker.it  This test is not yet approved or cleared by the [place] FDA and has been authorized for detection and/or diagnosis of SARS-CoV-2 by FDA under an Emergency Use Authorization (EUA). This EUA will remain in effect (meaning this test can be used) for the duration of the COVID-19 declaration under Section 564(b)(1) of the Act, 21 U.S.C. section 360bbb-3(b)(1), unless the authorization is terminated or revoked.  Performed at [facility], [address], [place], [place] [address]   Admission on 06/15/2022, Discharged on 06/15/2022  Component Date Value Ref Range Status   Preg Test, Ur 06/15/2022 NEGATIVE  NEGATIVE Final   Comment:        THE SENSITIVITY OF THIS METHODOLOGY IS >24 mIU/mL    SARS Coronavirus 2 06/15/2022 NEGATIVE  NEGATIVE Final   Comment: (NOTE) SARS-CoV-2 target nucleic acids are NOT DETECTED.  The SARS-CoV-2 RNA is generally detectable in upper and lower respiratory specimens during the acute phase of infection. Negative results do not preclude SARS-CoV-2 infection, do not rule out co-infections with other pathogens, and should not be used as the sole basis for treatment or other patient management decisions. Negative results must be combined with clinical observations, patient history, and epidemiological information. The  expected result is Negative.  Fact Sheet for Patients: HairSlick.no  Fact Sheet for Healthcare Providers: quierodirigir.com  This test is not yet approved or cleared by the [place] FDA and  has been authorized for detection and/or diagnosis of SARS-CoV-2 by FDA under an Emergency Use Authorization (  EUA). This EUA will remain  in effect (meaning this test can be used) for the duration of the COVID-19 declaration under Se                          ction 564(b)(1) of the Act, 21 U.S.C. section 360bbb-3(b)(1), unless the authorization is terminated or revoked sooner.  Performed at [facility], [address] [place], [place] [address]   Admission on 06/11/2022, Discharged on 06/11/2022  Component Date Value Ref Range Status   Lipase 06/11/2022 34  11 - 51 U/L Final   Performed at [facility], [address], [place], [place] [address]   Sodium 06/11/2022 140  135 - 145 mmol/L Final   Potassium 06/11/2022 3.6  3.5 - 5.1 mmol/L Final   HEMOLYSIS AT THIS LEVEL MAY AFFECT RESULT   Chloride 06/11/2022 103  98 - 111 mmol/L Final   CO2 06/11/2022 26  22 - 32 mmol/L Final   Glucose, Bld 06/11/2022 127 (H)  70 - 99 mg/dL Final   Glucose reference range applies only to samples taken after fasting for at least 8 hours.   BUN 06/11/2022 7  6 - 20 mg/dL Final   Creatinine, Ser 06/11/2022 0.88  0.44 - 1.00 mg/dL Final   Calcium 82/95/6213 9.8  8.9 - 10.3 mg/dL Final   Total Protein 08/65/7846 7.4  6.5 - 8.1 g/dL Final   Albumin 96/29/5284 4.1  3.5 - 5.0 g/dL Final   AST 13/24/4010 34  15 - 41 U/L Final   HEMOLYSIS AT THIS LEVEL MAY AFFECT RESULT   ALT 06/11/2022 27  0 - 44 U/L Final   HEMOLYSIS AT THIS LEVEL MAY AFFECT RESULT   Alkaline Phosphatase 06/11/2022 68  38 - 126 U/L Final   Total Bilirubin 06/11/2022 0.7  0.3 - 1.2 mg/dL Final   HEMOLYSIS AT THIS LEVEL MAY AFFECT RESULT   GFR, Estimated 06/11/2022 >60  >60  mL/min Final   Comment: (NOTE) Calculated using the CKD-EPI Creatinine Equation (2021)    Anion gap 06/11/2022 11  5 - 15 Final   Performed at [facility], [address], [place], [place] [address]   WBC 06/11/2022 11.6 (H)  4.0 - 10.5 K/uL Final   RBC 06/11/2022 4.66  3.87 - 5.11 MIL/uL Final   Hemoglobin 06/11/2022 10.9 (L)  12.0 - 15.0 g/dL Final   HCT 66/44/0347 35.9 (L)  36.0 - 46.0 % Final   MCV 06/11/2022 77.0 (L)  80.0 - 100.0 fL Final   MCH 06/11/2022 23.4 (L)  26.0 - 34.0 pg Final   MCHC 06/11/2022 30.4  30.0 - 36.0 g/dL Final   RDW 42/59/5638 16.0 (H)  11.5 - 15.5 % Final   Platelets 06/11/2022 283  150 - 400 K/uL Final   REPEATED TO VERIFY   nRBC 06/11/2022 0.0  0.0 - 0.2 % Final   Performed at [facility], [address] [place], [place] [address]   Color, Urine 06/11/2022 YELLOW  YELLOW Final   APPearance 06/11/2022 HAZY (A)  CLEAR Final   Specific Gravity, Urine 06/11/2022 1.024  1.005 - 1.030 Final   pH 06/11/2022 5.0  5.0 - 8.0 Final   Glucose, UA 06/11/2022 NEGATIVE  NEGATIVE mg/dL Final   Hgb urine dipstick 06/11/2022 NEGATIVE  NEGATIVE Final   Bilirubin Urine 06/11/2022 NEGATIVE  NEGATIVE Final   Ketones, ur 06/11/2022 5 (A)  NEGATIVE mg/dL Final  Protein, ur 06/11/2022 >=300 (A)  NEGATIVE mg/dL Final   Nitrite 16/01/9603 NEGATIVE  NEGATIVE Final   Leukocytes,Ua 06/11/2022 TRACE (A)  NEGATIVE Final   RBC / HPF 06/11/2022 0-5  0 - 5 RBC/hpf Final   WBC, UA 06/11/2022 21-50  0 - 5 WBC/hpf Final   Bacteria, UA 06/11/2022 RARE (A)  NONE SEEN Final   Squamous Epithelial / HPF 06/11/2022 11-20  0 - 5 /HPF Final   Mucus 06/11/2022 PRESENT   Final   Ca Oxalate Crys, UA 06/11/2022 PRESENT   Final   Performed at [facility], [address], [place], [place] [address]   SARS Coronavirus 2 by RT PCR 06/11/2022 NEGATIVE  NEGATIVE Final   Influenza A by PCR 06/11/2022 NEGATIVE  NEGATIVE Final   Influenza B by PCR 06/11/2022 NEGATIVE  NEGATIVE Final    Comment: (NOTE) The Xpert Xpress SARS-CoV-2/FLU/RSV plus assay is intended as an aid in the diagnosis of influenza from Nasopharyngeal swab specimens and should not be used as a sole basis for treatment. Nasal washings and aspirates are unacceptable for Xpert Xpress SARS-CoV-2/FLU/RSV testing.  Fact Sheet for Patients: BloggerCourse.com  Fact Sheet for Healthcare Providers: SeriousBroker.it  This test is not yet approved or cleared by the [place] FDA and has been authorized for detection and/or diagnosis of SARS-CoV-2 by FDA under an Emergency Use Authorization (EUA). This EUA will remain in effect (meaning this test can be used) for the duration of the COVID-19 declaration under Section 564(b)(1) of the Act, 21 U.S.C. section 360bbb-3(b)(1), unless the authorization is terminated or revoked.     Resp Syncytial Virus by PCR 06/11/2022 NEGATIVE  NEGATIVE Final   Comment: (NOTE) Fact Sheet for Patients: BloggerCourse.com  Fact Sheet for Healthcare Providers: SeriousBroker.it  This test is not yet approved or cleared by the [place] FDA and has been authorized for detection and/or diagnosis of SARS-CoV-2 by FDA under an Emergency Use Authorization (EUA). This EUA will remain in effect (meaning this test can be used) for the duration of the COVID-19 declaration under Section 564(b)(1) of the Act, 21 U.S.C. section 360bbb-3(b)(1), unless the authorization is terminated or revoked.  Performed at [facility], [address], [place], [place] [address]    Preg Test, Ur 06/11/2022 NEGATIVE  NEGATIVE Final   Comment:        THE SENSITIVITY OF THIS METHODOLOGY IS >20 mIU/mL. Performed at [facility], [address] [place], [place] [address]   Admission on 06/09/2022, Discharged on 06/09/2022  Component Date Value Ref Range Status   Sodium 06/09/2022 137   135 - 145 mmol/L Final   Potassium 06/09/2022 3.3 (L)  3.5 - 5.1 mmol/L Final   Chloride 06/09/2022 104  98 - 111 mmol/L Final   CO2 06/09/2022 23  22 - 32 mmol/L Final   Glucose, Bld 06/09/2022 179 (H)  70 - 99 mg/dL Final   Glucose reference range applies only to samples taken after fasting for at least 8 hours.   BUN 06/09/2022 8  6 - 20 mg/dL Final   Creatinine, Ser 06/09/2022 0.76  0.44 - 1.00 mg/dL Final   Calcium 62/13/0865 8.7 (L)  8.9 - 10.3 mg/dL Final   GFR, Estimated 06/09/2022 >60  >60 mL/min Final   Comment: (NOTE) Calculated using the CKD-EPI Creatinine Equation (2021)    Anion gap 06/09/2022 10  5 - 15 Final   Performed at [facility], [address] [place], [place]  [address]   WBC 06/09/2022 11.9 (H)  4.0 - 10.5 K/uL Final   RBC 06/09/2022 4.25  3.87 - 5.11 MIL/uL Final   Hemoglobin 06/09/2022 10.2 (L)  12.0 - 15.0 g/dL Final   HCT 98/02/9146 33.2 (L)  36.0 - 46.0 % Final   MCV 06/09/2022 78.1 (L)  80.0 - 100.0 fL Final   MCH 06/09/2022 24.0 (L)  26.0 - 34.0 pg Final   MCHC 06/09/2022 30.7  30.0 - 36.0 g/dL Final   RDW 82/95/6213 15.9 (H)  11.5 - 15.5 % Final   Platelets 06/09/2022 261  150 - 400 K/uL Final   nRBC 06/09/2022 0.0  0.0 - 0.2 % Final   Neutrophils Relative % 06/09/2022 49  % Final   Neutro Abs 06/09/2022 5.9  1.7 - 7.7 K/uL Final   Lymphocytes Relative 06/09/2022 41  % Final   Lymphs Abs 06/09/2022 4.8 (H)  0.7 - 4.0 K/uL Final   Monocytes Relative 06/09/2022 6  % Final   Monocytes Absolute 06/09/2022 0.7  0.1 - 1.0 K/uL Final   Eosinophils Relative 06/09/2022 3  % Final   Eosinophils Absolute 06/09/2022 0.3  0.0 - 0.5 K/uL Final   Basophils Relative 06/09/2022 1  % Final   Basophils Absolute 06/09/2022 0.1  0.0 - 0.1 K/uL Final   Immature Granulocytes 06/09/2022 0  % Final   Abs Immature Granulocytes 06/09/2022 0.04  0.00 - 0.07 K/uL Final   Performed at [facility], [address], [place], [place] [address]  Admission on  06/07/2022, Discharged on 06/07/2022  Component Date Value Ref Range Status   Yeast Wet Prep HPF POC 06/07/2022 NONE SEEN  NONE SEEN Final   Trich, Wet Prep 06/07/2022 NONE SEEN  NONE SEEN Final   Clue Cells Wet Prep HPF POC 06/07/2022 NONE SEEN  NONE SEEN Final   WBC, Wet Prep HPF POC 06/07/2022 <10  <10 Final   Sperm 06/07/2022 NONE SEEN   Final   Performed at [facility], [address], [place], [place] [address]   Neisseria Gonorrhea 06/07/2022 Positive (A)   Final   Chlamydia 06/07/2022 Negative   Final   Comment 06/07/2022 Normal Reference Ranger Chlamydia - Negative   Final   Comment 06/07/2022 Normal Reference Range Neisseria Gonorrhea - Negative   Final  There may be more visits with results that are not included.    Allergies: Wellbutrin [bupropion], Omnipaque [iohexol], Penicillins, Atarax [hydroxyzine], Contrast media [iodinated contrast media], and Cymbalta [duloxetine hcl]  Medications:  Facility Ordered Medications  Medication   acetaminophen (TYLENOL) tablet 650 mg   alum & mag hydroxide-simeth (MAALOX/MYLANTA) 200-200-20 MG/5ML suspension 30 mL   magnesium hydroxide (MILK OF MAGNESIA) suspension 30 mL   traZODone (DESYREL) tablet 50 mg   gabapentin (NEURONTIN) capsule 100 mg   lithium carbonate (ESKALITH) ER tablet 450 mg   [START ON 09/29/2022] venlafaxine XR (EFFEXOR-XR) 24 hr capsule 150 mg   metoprolol tartrate (LOPRESSOR) tablet 25 mg   haloperidol (HALDOL) tablet 10 mg   benztropine (COGENTIN) tablet 0.5 mg   [START ON 09/29/2022] amLODipine (NORVASC) tablet 10 mg   [START ON 09/29/2022] insulin aspart protamine- aspart (NOVOLOG MIX 70/30) injection 20 Units   [START ON 09/29/2022] atorvastatin (LIPITOR) tablet 20 mg   PTA Medications  Medication Sig   metoprolol tartrate (LOPRESSOR) 25 MG tablet Take 25 mg by mouth 2 (two) times daily.   Cholecalciferol (VITAMIN D3) 25 MCG (1000 UT) CAPS Take 1,000 Units by mouth daily.  simvastatin (ZOCOR)  40 MG tablet Take 40 mg by mouth at bedtime.   amLODipine (NORVASC) 10 MG tablet Take 10 mg by mouth daily.   insulin aspart protamine- aspart (NOVOLOG MIX 70/30) (70-30) 100 UNIT/ML injection Inject 20 Units into the skin 2 (two) times daily with a meal.   haloperidol (HALDOL) 10 MG tablet Take 1 tablet (10 mg total) by mouth at bedtime. For mood stabilization (Patient taking differently: Take 5 mg by mouth 3 (three) times daily. For mood stabilization)   venlafaxine XR (EFFEXOR-XR) 75 MG 24 hr capsule Take 1 capsule (75 mg total) by mouth daily with breakfast. For depression (Patient not taking: Reported on 07/06/2022)   traZODone (DESYREL) 50 MG tablet Take 1 tablet (50 mg total) by mouth at bedtime as needed for sleep. (Patient not taking: Reported on 07/06/2022)   lithium carbonate (ESKALITH) 450 MG ER tablet Take 1 tablet (450 mg total) by mouth at bedtime. For mood stabilization   benztropine (COGENTIN) 0.5 MG tablet Take 1 tablet (0.5 mg total) by mouth 2 (two) times daily. For eps   gabapentin (NEURONTIN) 100 MG capsule Take 1 capsule (100 mg total) by mouth 3 (three) times daily. For agitation   diclofenac Sodium (VOLTAREN) 1 % GEL Apply 2 g topically 4 (four) times daily.   venlafaxine XR (EFFEXOR-XR) 150 MG 24 hr capsule Take 150 mg by mouth daily with breakfast.   clomiPRAMINE (ANAFRANIL) 25 MG capsule Take 25 mg by mouth at bedtime. Take 1 capsule at bedtime for 3 days then 2 capsules for 3 days then 3 capsules at bedtime   ondansetron (ZOFRAN-ODT) 4 MG disintegrating tablet Take 1 tablet (4 mg total) by mouth every 8 (eight) hours as needed for nausea or vomiting.      Medical Decision Making  [name] Nazaret Arrellano was admitted to [facility] continuous assessment unit for Suicidal ideation, crisis management, and stabilization. Routine labs ordered, which include Lab Orders         CBC with Differential/Platelet         Comprehensive metabolic panel          Hemoglobin A1c         Magnesium         Ethanol         Lipid panel         TSH         Prolactin         Lithium level         POC urine preg, ED         POCT Urine Drug Screen - (I-Screen)         CBG monitoring    Medication Management: Medications started Meds ordered this encounter  Medications   acetaminophen (TYLENOL) tablet 650 mg   alum & mag hydroxide-simeth (MAALOX/MYLANTA) 200-200-20 MG/5ML suspension 30 mL   magnesium hydroxide (MILK OF MAGNESIA) suspension 30 mL   traZODone (DESYREL) tablet 50 mg   gabapentin (NEURONTIN) capsule 100 mg   lithium carbonate (ESKALITH) ER tablet 450 mg   venlafaxine XR (EFFEXOR-XR) 24 hr capsule 150 mg   metoprolol tartrate (LOPRESSOR) tablet 25 mg   haloperidol (HALDOL) tablet 10 mg   benztropine (COGENTIN) tablet 0.5 mg   amLODipine (NORVASC) tablet 10 mg   insulin aspart protamine- aspart (NOVOLOG MIX 70/30) injection 20 Units   atorvastatin (LIPITOR) tablet 20 mg    Will maintain observation checks every 15 minutes for safety.   Recommendations  Based on my evaluation the patient does not appear to have an emergency medical condition.  -Recommend admission to the continuous observation unit overnight for safety monitoring with reevaluation in the morning (09/29/22) for SI/HI/AVH.  Sunday Corn, NP 09/28/22  7:02 PM

## 2022-09-29 DIAGNOSIS — R45851 Suicidal ideations: Secondary | ICD-10-CM | POA: Diagnosis not present

## 2022-09-29 LAB — PROLACTIN: Prolactin: 26 ng/mL (ref 4.8–33.4)

## 2022-09-29 LAB — GLUCOSE, CAPILLARY: Glucose-Capillary: 171 mg/dL — ABNORMAL HIGH (ref 70–99)

## 2022-09-29 MED ORDER — HALOPERIDOL 10 MG PO TABS: 10.0000 mg | ORAL_TABLET | Freq: Every day | ORAL | Status: DC

## 2022-09-29 NOTE — Discharge Instructions (Addendum)
The suicide prevention education provided includes the following: Suicide risk factors Suicide prevention and interventions National Suicide Hotline telephone number [facility] assessment telephone number [facility] Emergency Assistance 911 [facility] and/or Residential Mobile Crisis Unit telephone number   Discharge recommendations:   Medications: Patient is to take medications as prescribed. The patient or patient's guardian is to contact a medical professional and/or outpatient provider to address any new side effects that develop. The patient or the patient's guardian should update outpatient providers of any new medications and/or medication changes.    Outpatient Follow up: Please review list of outpatient resources for psychiatry and counseling. Please follow up with your primary care provider for all medical related needs.    Therapy: We recommend that patient participate in individual therapy to address mental health concerns.   Atypical antipsychotics: If you are prescribed an atypical antipsychotic, it is recommended that your height, weight, BMI, blood pressure, fasting lipid panel, and fasting blood sugar be monitored by your outpatient providers.  Safety:   The following safety precautions should be taken:   No sharp objects. This includes scissors, razors, scrapers, and putty knives.   Chemicals should be removed and locked up.   Medications should be removed and locked up.   Weapons should be removed and locked up. This includes firearms, knives and instruments that can be used to cause injury.   The patient should abstain from use of illicit substances/drugs and abuse of any medications.  If symptoms worsen or do not continue to improve or if the patient becomes actively suicidal or homicidal then it is recommended that the patient return to the closest hospital emergency department, the [facility], or call 911  for further evaluation and treatment. National Suicide Prevention Lifeline 1-800-SUICIDE or 240-198-8207.  About 988 988 offers 24/7 access to trained crisis counselors who can help people experiencing mental health-related distress. People can call or text 988 or chat 988lifeline.org for themselves or if they are worried about a loved one who may need crisis support.

## 2022-09-29 NOTE — Progress Notes (Signed)
Received [name] this AM awake in her chair bed, she woke up on her own. She received breakfast and was compliant with her medications. She denied all of the psychiatric symptoms.  Later she received her D/C order. She received her AVS, questions answered and a taxi was called. She retrieved her personal items and was escorted to the lobby where the taxi was waiting for her.

## 2022-09-29 NOTE — ED Notes (Signed)
Patient resting quietly in bed with eyes closed. Respirations equal and unlabored, skin warm and dry, NAD. Routine safety checks conducted according to facility protocol. Will continue to monitor for safety.  

## 2022-09-29 NOTE — ED Provider Notes (Signed)
FBC/OBS ASAP Discharge Summary  Date and Time: 09/29/2022 8:49 AM  Name: [name]  MRN:  [id number]   Discharge Diagnoses:  Final diagnoses:  Suicidal ideations  Agitation   HPI: [name] is a 32 y.o. female patient with a past psychiatric history of bipolar 1 disorder, generalized anxiety disorder, major depressive disorder, suicidal ideation, impulse control disorder, schizoaffective disorder, schizophrenia, disorganized, adjustment disorder with emotional disturbance, affective psychosis, borderline intellectual functioning, recurrent suicide attempts, and numerous inpatient admissions who presented to [facility] voluntarily and unaccompanied via [facility] on 09/29/2022 with complaints of suicidal ideations.  She was admitted to the continuous assessment unit for overnight observation.    Patient assessed face-to-face by this provider and chart reviewed on 09/29/22   Subjective:   On assessment patient is observed sitting in her bed awake.  She is bright upon approach and smiling.  She immediately states, "I feel great I am ready to go home".  She is alert/oriented x 4, cooperative, and attentive.  She continues to feel frustrated due to her current relationship problems with her boyfriend.  However she states she is ready to move past it and states, "I just got a do what is right for me".  She was able to sleep while on the unit and denies any concerns with appetite.  She is denying suicidal ideations.  She verbally contracts for safety.  Safety planning/intervention was completed.  [place] states she is having utilize 988, 911, and how to reach her ACT team.  She denies access to firearms/weapons.  She is denying any HI/AVH.  She does not appear manic or psychotic.  Objectively she does not appear to be responding to internal/external stimuli.  She is able to answer questions appropriately.  Stay Summary:   Patient remained calm and cooperative while on the unit.  She was appropriate with  staff and compliant with medications.  She required no as needed medications for agitation.  She agrees to follow-up with her ACT team today.    Total Time spent with patient: 30 minutes  Past Psychiatric History: see H&P Past Medical History: see H&P Family History: see H&P Family Psychiatric History: see H&P Social History: see H&P Tobacco Cessation:  N/A, patient does not currently use tobacco products  Current Medications:  Current Facility-Administered Medications  Medication Dose Route Frequency Provider Last Rate Last Admin   acetaminophen (TYLENOL) tablet 650 mg  650 mg Oral Q6H PRN Sunday Corn, NP       alum & mag hydroxide-simeth (MAALOX/MYLANTA) 200-200-20 MG/5ML suspension 30 mL  30 mL Oral Q4H PRN Sunday Corn, NP       amLODipine (NORVASC) tablet 10 mg  10 mg Oral Daily Sunday Corn, NP       atorvastatin (LIPITOR) tablet 20 mg  20 mg Oral Daily Sunday Corn, NP       benztropine (COGENTIN) tablet 0.5 mg  0.5 mg Oral BID Sunday Corn, NP   0.5 mg at 09/28/22 2121   gabapentin (NEURONTIN) capsule 100 mg  100 mg Oral TID Sunday Corn, NP   100 mg at 09/28/22 2121   haloperidol (HALDOL) tablet 10 mg  10 mg Oral QHS Sunday Corn, NP   10 mg at 09/28/22 2121   insulin aspart protamine- aspart (NOVOLOG MIX 70/30) injection 20 Units  20 Units Subcutaneous BID WC Sunday Corn, NP   20 Units at 09/29/22 0808   lithium carbonate (ESKALITH) ER tablet 450 mg  450 mg Oral QHS Sunday Corn, NP   450 mg at 09/28/22 2121   magnesium hydroxide (MILK OF MAGNESIA) suspension 30 mL  30 mL Oral Daily PRN Sunday Corn, NP       metoprolol tartrate (LOPRESSOR) tablet 25 mg  25 mg Oral BID Sunday Corn, NP   25 mg at 09/28/22 2121   traZODone (DESYREL) tablet 50 mg  50 mg Oral QHS PRN Sunday Corn, NP       venlafaxine XR (EFFEXOR-XR) 24 hr capsule 150 mg  150 mg Oral Q breakfast Sunday Corn, NP       Current Outpatient Medications   Medication Sig Dispense Refill   amLODipine (NORVASC) 10 MG tablet Take 10 mg by mouth daily.     benztropine (COGENTIN) 0.5 MG tablet Take 1 tablet (0.5 mg total) by mouth 2 (two) times daily. For eps 60 tablet 0   Cholecalciferol (VITAMIN D3) 25 MCG (1000 UT) CAPS Take 1,000 Units by mouth daily.     clomiPRAMINE (ANAFRANIL) 25 MG capsule Take 25 mg by mouth at bedtime. Take 1 capsule at bedtime for 3 days then 2 capsules for 3 days then 3 capsules at bedtime     diclofenac Sodium (VOLTAREN) 1 % GEL Apply 2 g topically 4 (four) times daily. 50 g 0   gabapentin (NEURONTIN) 100 MG capsule Take 1 capsule (100 mg total) by mouth 3 (three) times daily. For agitation 90 capsule 0   haloperidol (HALDOL) 10 MG tablet Take 1 tablet (10 mg total) by mouth at bedtime.     insulin aspart protamine- aspart (NOVOLOG MIX 70/30) (70-30) 100 UNIT/ML injection Inject 20 Units into the skin 2 (two) times daily with a meal.     lithium carbonate (ESKALITH) 450 MG ER tablet Take 1 tablet (450 mg total) by mouth at bedtime. For mood stabilization 30 tablet 0   metoprolol tartrate (LOPRESSOR) 25 MG tablet Take 25 mg by mouth 2 (two) times daily.     ondansetron (ZOFRAN-ODT) 4 MG disintegrating tablet Take 1 tablet (4 mg total) by mouth every 8 (eight) hours as needed for nausea or vomiting. 10 tablet 0   simvastatin (ZOCOR) 40 MG tablet Take 40 mg by mouth at bedtime.     traZODone (DESYREL) 50 MG tablet Take 1 tablet (50 mg total) by mouth at bedtime as needed for sleep. (Patient not taking: Reported on 07/06/2022) 30 tablet 0   venlafaxine XR (EFFEXOR-XR) 150 MG 24 hr capsule Take 150 mg by mouth daily with breakfast.      PTA Medications:  Facility Ordered Medications  Medication   acetaminophen (TYLENOL) tablet 650 mg   alum & mag hydroxide-simeth (MAALOX/MYLANTA) 200-200-20 MG/5ML suspension 30 mL   magnesium hydroxide (MILK OF MAGNESIA) suspension 30 mL   traZODone (DESYREL) tablet 50 mg   gabapentin  (NEURONTIN) capsule 100 mg   lithium carbonate (ESKALITH) ER tablet 450 mg   venlafaxine XR (EFFEXOR-XR) 24 hr capsule 150 mg   metoprolol tartrate (LOPRESSOR) tablet 25 mg   haloperidol (HALDOL) tablet 10 mg   benztropine (COGENTIN) tablet 0.5 mg   amLODipine (NORVASC) tablet 10 mg   insulin aspart protamine- aspart (NOVOLOG MIX 70/30) injection 20 Units   atorvastatin (LIPITOR) tablet 20 mg   PTA Medications  Medication Sig   metoprolol tartrate (LOPRESSOR) 25 MG tablet Take 25 mg by mouth 2 (two) times daily.   Cholecalciferol (VITAMIN D3) 25 MCG (1000 UT) CAPS Take 1,000 Units by  mouth daily.   simvastatin (ZOCOR) 40 MG tablet Take 40 mg by mouth at bedtime.   amLODipine (NORVASC) 10 MG tablet Take 10 mg by mouth daily.   insulin aspart protamine- aspart (NOVOLOG MIX 70/30) (70-30) 100 UNIT/ML injection Inject 20 Units into the skin 2 (two) times daily with a meal.   traZODone (DESYREL) 50 MG tablet Take 1 tablet (50 mg total) by mouth at bedtime as needed for sleep. (Patient not taking: Reported on 07/06/2022)   lithium carbonate (ESKALITH) 450 MG ER tablet Take 1 tablet (450 mg total) by mouth at bedtime. For mood stabilization   benztropine (COGENTIN) 0.5 MG tablet Take 1 tablet (0.5 mg total) by mouth 2 (two) times daily. For eps   gabapentin (NEURONTIN) 100 MG capsule Take 1 capsule (100 mg total) by mouth 3 (three) times daily. For agitation   diclofenac Sodium (VOLTAREN) 1 % GEL Apply 2 g topically 4 (four) times daily.   venlafaxine XR (EFFEXOR-XR) 150 MG 24 hr capsule Take 150 mg by mouth daily with breakfast.   clomiPRAMINE (ANAFRANIL) 25 MG capsule Take 25 mg by mouth at bedtime. Take 1 capsule at bedtime for 3 days then 2 capsules for 3 days then 3 capsules at bedtime   ondansetron (ZOFRAN-ODT) 4 MG disintegrating tablet Take 1 tablet (4 mg total) by mouth every 8 (eight) hours as needed for nausea or vomiting.   haloperidol (HALDOL) 10 MG tablet Take 1 tablet (10 mg total) by  mouth at bedtime.       07/03/2022    1:22 AM 04/09/2022    8:58 PM 01/10/2022    6:13 PM  Depression screen PHQ 2/9  Decreased Interest 1 1 2   Down, Depressed, Hopeless 1 1 2   PHQ - 2 Score 2 2 4   Altered sleeping 2 2 2   Tired, decreased energy 2 2 1   Change in appetite 1 1 2   Feeling bad or failure about yourself  2 2 2   Trouble concentrating 1 1 2   Moving slowly or fidgety/restless 2 1 2   Suicidal thoughts 1 1 2   PHQ-9 Score 13 12 17   Difficult doing work/chores Somewhat difficult Somewhat difficult Very difficult    Flowsheet Row ED from 09/28/2022 in [facility] ED from 09/04/2022 in [facility] Emergency Department at [facility] ED from 07/16/2022 in [facility] Emergency Department at [facility]  C-SSRS RISK CATEGORY High Risk No Risk High Risk       Musculoskeletal  Strength & Muscle Tone: within normal limits Gait & Station: normal Patient leans: N/A  Psychiatric Specialty Exam  Presentation  General Appearance:  Appropriate for Environment  Eye Contact: Good  Speech: Clear and Coherent (Patient yells throughout assessment)  Speech Volume: Increased  Handedness: Left   Mood and Affect  Mood: Depressed; Irritable; Labile; Angry  Affect: Congruent; Labile; Depressed; Tearful   Thought Process  Thought Processes: Coherent  Descriptions of Associations:Intact  Orientation:Full (Time, Place and Person)  Thought Content:Paranoid Ideation  Diagnosis of Schizophrenia or Schizoaffective disorder in past: Yes    Hallucinations:Hallucinations: None  Ideas of Reference:Paranoia  Suicidal Thoughts:Suicidal Thoughts: Yes, Active SI Active Intent and/or Plan: With Intent; With Plan; With Means to Carry Out; With Access to Means  Homicidal Thoughts:Homicidal Thoughts: No (Patient denies HI, states she's just going to punch her boyfriend "not kill him")   Sensorium  Memory: Immediate Fair; Recent Fair;  Remote Fair  Judgment: Poor  Insight: Poor; Lacking   Executive  Functions  Concentration: Fair  Attention Span: Fair  Recall: [employer] of Knowledge: Fair  Language: Fair   Psychomotor Activity  Psychomotor Activity: Psychomotor Activity: Normal   Assets  Assets: Manufacturing systems engineer; Desire for Improvement; Financial Resources/Insurance; Housing; Physical Health; Resilience   Sleep  Sleep: Sleep: Poor Number of Hours of Sleep: 2   Nutritional Assessment (For OBS and FBC admissions only) Has the patient had a weight loss or gain of 10 pounds or more in the last 3 months?: No Has the patient had a decrease in food intake/or appetite?: No Does the patient have dental problems?: No Does the patient have eating habits or behaviors that may be indicators of an eating disorder including binging or inducing vomiting?: No Has the patient recently lost weight without trying?: 0 Has the patient been eating poorly because of a decreased appetite?: 0 Malnutrition Screening Tool Score: 0    Physical Exam  Physical Exam Vitals and nursing note reviewed.  Constitutional:      General: She is not in acute distress.    Appearance: Normal appearance. She is not ill-appearing.  Eyes:     General:        Right eye: No discharge.        Left eye: No discharge.  Cardiovascular:     Rate and Rhythm: Normal rate.  Pulmonary:     Effort: Pulmonary effort is normal.  Musculoskeletal:        General: Normal range of motion.     Cervical back: Normal range of motion.  Skin:    Coloration: Skin is not jaundiced or pale.  Neurological:     Mental Status: She is alert and oriented to person, place, and time.  Psychiatric:        Attention and Perception: Attention and perception normal.        Mood and Affect: Affect normal.        Speech: Speech normal.        Behavior: Behavior normal.        Thought Content: Thought content normal.        Cognition and Memory:  Cognition normal.        Judgment: Judgment is impulsive.    Review of Systems  Constitutional: Negative.   HENT: Negative.    Eyes: Negative.   Respiratory: Negative.    Cardiovascular: Negative.   Musculoskeletal: Negative.   Skin: Negative.   Neurological: Negative.   Psychiatric/Behavioral: Negative.     Blood pressure 126/79, pulse 83, temperature 98.5 F (36.9 C), temperature source Oral, resp. rate 19, last menstrual period 08/30/2022, SpO2 99 %. There is no height or weight on file to calculate BMI.  Demographic Factors:  Female and Unemployed  Loss Factors: NA  Historical Factors: Prior suicide attempts and Impulsivity  Risk Reduction Factors:   Sense of responsibility to family, Living with another person, especially a relative, Positive social support, Positive therapeutic relationship, and Positive coping skills or problem solving skills  Continued Clinical Symptoms:  Bipolar Disorder:   Depressive phase Depression:   Impulsivity Schizophrenia:   Depressive state Previous Psychiatric Diagnoses and Treatments  Cognitive Features That Contribute To Risk:  None    Suicide Risk:  Minimal: No identifiable suicidal ideation.  Patients presenting with no risk factors but with morbid ruminations; may be classified as minimal risk based on the severity of the depressive symptoms  Plan Of Care/Follow-up recommendations:  Activity:  as tolerated  Diet:  Regular   Disposition:  Discharge patient.  Patient agrees to follow-up with ACT team   Ardis Hughs, NP 09/29/2022, 8:49 AM

## 2022-09-30 LAB — HEMOGLOBIN A1C
Hgb A1c MFr Bld: 7.9 % — ABNORMAL HIGH (ref 4.8–5.6)
Mean Plasma Glucose: 180 mg/dL

## 2022-11-01 ENCOUNTER — Other Ambulatory Visit: Payer: Self-pay

## 2022-11-01 ENCOUNTER — Encounter (HOSPITAL_COMMUNITY): Payer: Self-pay

## 2022-11-01 ENCOUNTER — Emergency Department (HOSPITAL_COMMUNITY)
Admission: EM | Admit: 2022-11-01 | Discharge: 2022-11-02 | Disposition: A | Discharge status: Home / Self Care | Payer: 59 | Attending: Emergency Medicine | Admitting: Emergency Medicine

## 2022-11-01 DIAGNOSIS — Z79899 Other long term (current) drug therapy: Secondary | ICD-10-CM | POA: Diagnosis not present

## 2022-11-01 DIAGNOSIS — I10 Essential (primary) hypertension: Secondary | ICD-10-CM | POA: Diagnosis not present

## 2022-11-01 DIAGNOSIS — E1165 Type 2 diabetes mellitus with hyperglycemia: Secondary | ICD-10-CM | POA: Diagnosis not present

## 2022-11-01 DIAGNOSIS — R1030 Lower abdominal pain, unspecified: Secondary | ICD-10-CM | POA: Diagnosis not present

## 2022-11-01 DIAGNOSIS — R1031 Right lower quadrant pain: Secondary | ICD-10-CM | POA: Insufficient documentation

## 2022-11-01 DIAGNOSIS — R1033 Periumbilical pain: Secondary | ICD-10-CM | POA: Diagnosis not present

## 2022-11-01 DIAGNOSIS — R1084 Generalized abdominal pain: Secondary | ICD-10-CM | POA: Diagnosis not present

## 2022-11-01 DIAGNOSIS — D649 Anemia, unspecified: Secondary | ICD-10-CM | POA: Diagnosis not present

## 2022-11-01 DIAGNOSIS — Z794 Long term (current) use of insulin: Secondary | ICD-10-CM | POA: Diagnosis not present

## 2022-11-01 NOTE — ED Provider Notes (Signed)
[place] EMERGENCY DEPARTMENT AT [facility] Provider Note   CSN: [id number] Arrival date & time: 11/01/22  2250     History  Chief Complaint  Patient presents with   Abdominal Pain    [name] is a 32 y.o. female.  Patient with a history of anxiety, depression, diabetes, bipolar disorder, hypertension presenting with lower abdominal pain for the past 2 days.  Pain is to her periumbilical area and right lower quadrant.  It is constant.  Nothing makes it better or worse.  The sharp stabbing pain to her lower abdomen.  Denies any pain with urination or blood in the urine.  Nausea but no vomiting.  No fever, chills, diarrhea, constipation, vomiting.  No vaginal bleeding or discharge.  Did recently complete her menses.  She is concerned she could have gonorrhea again as this feels similar but she denies any vaginal discharge or bleeding.  Also complaining of pain to her feet bilaterally.  Still has appendix and gallbladder.  Previous C-section.   The history is provided by the patient.  Abdominal Pain Associated symptoms: no cough, no dysuria, no fever, no hematuria, no nausea, no shortness of breath and no vomiting        Home Medications Prior to Admission medications   Medication Sig Start Date End Date Taking? Authorizing Provider  amLODipine (NORVASC) 10 MG tablet Take 10 mg by mouth daily.    [provider]  benztropine (COGENTIN) 0.5 MG tablet Take 1 tablet (0.5 mg total) by mouth 2 (two) times daily. For eps 04/14/22   Armandina Stammer I, NP  Cholecalciferol (VITAMIN D3) 25 MCG (1000 UT) CAPS Take 1,000 Units by mouth daily.    [provider]  clomiPRAMINE (ANAFRANIL) 25 MG capsule Take 25 mg by mouth at bedtime. Take 1 capsule at bedtime for 3 days then 2 capsules for 3 days then 3 capsules at bedtime    [provider]  diclofenac Sodium (VOLTAREN) 1 % GEL Apply 2 g topically 4 (four) times daily. 06/07/22   Antony Madura, PA-C   gabapentin (NEURONTIN) 100 MG capsule Take 1 capsule (100 mg total) by mouth 3 (three) times daily. For agitation 04/14/22   Armandina Stammer I, NP  haloperidol (HALDOL) 10 MG tablet Take 1 tablet (10 mg total) by mouth at bedtime. 09/29/22   Ardis Hughs, NP  insulin aspart protamine- aspart (NOVOLOG MIX 70/30) (70-30) 100 UNIT/ML injection Inject 20 Units into the skin 2 (two) times daily with a meal.    [provider]  lithium carbonate (ESKALITH) 450 MG ER tablet Take 1 tablet (450 mg total) by mouth at bedtime. For mood stabilization 04/14/22   Armandina Stammer I, NP  metoprolol tartrate (LOPRESSOR) 25 MG tablet Take 25 mg by mouth 2 (two) times daily.    [provider]  ondansetron (ZOFRAN-ODT) 4 MG disintegrating tablet Take 1 tablet (4 mg total) by mouth every 8 (eight) hours as needed for nausea or vomiting. 07/09/22   Pricilla Loveless, MD  simvastatin (ZOCOR) 40 MG tablet Take 40 mg by mouth at bedtime.    [provider]  traZODone (DESYREL) 50 MG tablet Take 1 tablet (50 mg total) by mouth at bedtime as needed for sleep. Patient not taking: Reported on 07/06/2022 04/14/22   Armandina Stammer I, NP  venlafaxine XR (EFFEXOR-XR) 150 MG 24 hr capsule Take 150 mg by mouth daily with breakfast.    [provider]      Allergies  Wellbutrin [bupropion], Omnipaque [iohexol], Penicillins, Atarax [hydroxyzine], Contrast media [iodinated contrast media], and Cymbalta [duloxetine hcl]    Review of Systems   Review of Systems  Constitutional:  Negative for activity change, appetite change and fever.  HENT:  Negative for congestion and rhinorrhea.   Respiratory:  Negative for cough, chest tightness and shortness of breath.   Gastrointestinal:  Positive for abdominal pain. Negative for nausea and vomiting.  Genitourinary:  Negative for dysuria and hematuria.  Musculoskeletal:  Negative for arthralgias and myalgias.  Skin:  Negative for rash.  Neurological:  Negative  for dizziness, weakness and headaches.   all other systems are negative except as noted in the HPI and PMH.    Physical Exam Updated Vital Signs BP 118/67   Pulse 83   Temp 98.7 F (37.1 C)   Resp (!) 21   LMP 10/31/2022   SpO2 96%  Physical Exam Vitals and nursing note reviewed.  Constitutional:      General: She is not in acute distress.    Appearance: She is well-developed.  HENT:     Head: Normocephalic and atraumatic.     Mouth/Throat:     Pharynx: No oropharyngeal exudate.  Eyes:     Conjunctiva/sclera: Conjunctivae normal.     Pupils: Pupils are equal, round, and reactive to light.  Neck:     Comments: No meningismus. Cardiovascular:     Rate and Rhythm: Normal rate and regular rhythm.     Heart sounds: Normal heart sounds. No murmur heard. Pulmonary:     Effort: Pulmonary effort is normal. No respiratory distress.     Breath sounds: Normal breath sounds.  Abdominal:     Palpations: Abdomen is soft.     Tenderness: There is abdominal tenderness. There is guarding. There is no rebound.     Comments: TTP periumbilical and RLQ. No guarding or rebound  Genitourinary:    Comments: Chaperone present [place] NT.  Normal external genitalia.  Scant white discharge in vaginal vault.  No CMT.  No lateralizing adnexal tenderness. Musculoskeletal:        General: No tenderness. Normal range of motion.     Cervical back: Normal range of motion and neck supple.  Skin:    General: Skin is warm.  Neurological:     Mental Status: She is alert and oriented to person, place, and time.     Cranial Nerves: No cranial nerve deficit.     Motor: No abnormal muscle tone.     Coordination: Coordination normal.     Comments: No ataxia on finger to nose bilaterally. No pronator drift. 5/5 strength throughout. CN 2-12 intact.Equal grip strength. Sensation intact.   Psychiatric:        Behavior: Behavior normal.     ED Results / Procedures / Treatments   Labs (all labs ordered are  listed, but only abnormal results are displayed) Labs Reviewed  URINALYSIS, ROUTINE W REFLEX MICROSCOPIC - Abnormal; Notable for the following components:      Result Value   Color, Urine STRAW (*)    Glucose, UA >=500 (*)    Hgb urine dipstick SMALL (*)    All other components within normal limits  CBC WITH DIFFERENTIAL/PLATELET - Abnormal; Notable for the following components:   WBC 10.7 (*)    Hemoglobin 9.7 (*)    HCT 31.8 (*)    MCV 78.1 (*)    MCH 23.8 (*)    RDW 16.3 (*)    Abs Immature Granulocytes 0.15 (*)  All other components within normal limits  COMPREHENSIVE METABOLIC PANEL - Abnormal; Notable for the following components:   Potassium 3.2 (*)    Glucose, Bld 242 (*)    Calcium 8.6 (*)    All other components within normal limits  LIPASE, BLOOD - Abnormal; Notable for the following components:   Lipase 64 (*)    All other components within normal limits  WET PREP, GENITAL  PREGNANCY, URINE  GC/CHLAMYDIA PROBE AMP ([place]) NOT AT [facility]    EKG None  Radiology CT ABDOMEN PELVIS WO CONTRAST  Result Date: 11/02/2022 CLINICAL DATA:  Right lower quadrant pain. EXAM: CT ABDOMEN AND PELVIS WITHOUT CONTRAST TECHNIQUE: Multidetector CT imaging of the abdomen and pelvis was performed following the standard protocol without IV contrast. RADIATION DOSE REDUCTION: This exam was performed according to the departmental dose-optimization program which includes automated exposure control, adjustment of the mA and/or kV according to patient size and/or use of iterative reconstruction technique. COMPARISON:  February 25, 2017 FINDINGS: Lower chest: No acute abnormality. Hepatobiliary: No focal liver abnormality is seen. No gallstones, gallbladder wall thickening, or biliary dilatation. Pancreas: Unremarkable. No pancreatic ductal dilatation or surrounding inflammatory changes. Spleen: Normal in size without focal abnormality. Adrenals/Urinary Tract: Adrenal glands are unremarkable.  Kidneys are normal, without renal calculi, focal lesion, or hydronephrosis. Bladder is unremarkable. Stomach/Bowel: Stomach is within normal limits. Appendix appears normal. No evidence of bowel wall thickening, distention, or inflammatory changes. Vascular/Lymphatic: No significant vascular findings are present. Stable 12 mm and 13 mm mesenteric lymph nodes are seen within the mid right abdomen. Reproductive: Uterus and bilateral adnexa are unremarkable. Other: No abdominal wall hernia or abnormality. No abdominopelvic ascites. Musculoskeletal: Small nodular appearing areas of soft tissue attenuation are again seen along the midline of the anterior pelvic wall. These demonstrate interval decreased in size when compared to the prior study. No acute osseous abnormalities are identified IMPRESSION: 1. No acute or active process within the abdomen or pelvis. 2. Small nodular appearing areas of soft tissue attenuation along the midline of the anterior pelvic wall, likely representing small areas of scarring. Electronically Signed   By: Aram Candela M.D.   On: 11/02/2022 01:16    Procedures Procedures    Medications Ordered in ED Medications - No data to display  ED Course/ Medical Decision Making/ A&P                             Medical Decision Making Amount and/or Complexity of Data Reviewed Labs: ordered. Decision-making details documented in ED Course. Radiology: ordered and independent interpretation performed. Decision-making details documented in ED Course. ECG/medicine tests: ordered and independent interpretation performed. Decision-making details documented in ED Course.   2 days of lower abdominal pain.  No associated symptoms.  Stable vitals.  No peritoneal signs  Pelvic Exam performed as above.  hCG is negative.  Urinalysis negative for infection.  Labs reassuring. Labs show stable anemia.  Hyperglycemia without DKA.  Minimal lipase elevation.  CT scan negative for acute  surgical pathology.  Low suspicion for appendicitis, cholecystitis, ovarian torsion or other acute surgical pathology.  Patient concerned about gonorrhea and chlamydia.  Discussed she can follow-up her swabs tomorrow on MyChart.  Follow-up with PCP.  Turn to the ED with worsening abdominal pain, fever, vomiting, or other concerns.        Final Clinical Impression(s) / ED Diagnoses Final diagnoses:  Lower abdominal pain    Rx /  DC Orders ED Discharge Orders     None         Hulet Ehrmann, Jeannett Senior, MD 11/02/22 [phone]

## 2022-11-01 NOTE — ED Triage Notes (Signed)
Pt. BIB [facility] for abdominal pain x3 days. Last time this happened she had gonorrhea. She describes it as a sharp pain in her lower abdomen. Pt. Also c/o pain in her feet and toes bilaterally.

## 2022-11-02 ENCOUNTER — Emergency Department (HOSPITAL_COMMUNITY): Payer: 59

## 2022-11-02 DIAGNOSIS — R1033 Periumbilical pain: Secondary | ICD-10-CM | POA: Diagnosis not present

## 2022-11-02 LAB — CBC WITH DIFFERENTIAL/PLATELET
Abs Immature Granulocytes: 0.15 10*3/uL — ABNORMAL HIGH (ref 0.00–0.07)
Basophils Absolute: 0.1 10*3/uL (ref 0.0–0.1)
Basophils Relative: 1 %
Eosinophils Absolute: 0.4 10*3/uL (ref 0.0–0.5)
Eosinophils Relative: 4 %
HCT: 31.8 % — ABNORMAL LOW (ref 36.0–46.0)
Hemoglobin: 9.7 g/dL — ABNORMAL LOW (ref 12.0–15.0)
Immature Granulocytes: 1 %
Lymphocytes Relative: 37 %
Lymphs Abs: 3.9 10*3/uL (ref 0.7–4.0)
MCH: 23.8 pg — ABNORMAL LOW (ref 26.0–34.0)
MCHC: 30.5 g/dL (ref 30.0–36.0)
MCV: 78.1 fL — ABNORMAL LOW (ref 80.0–100.0)
Monocytes Absolute: 0.6 10*3/uL (ref 0.1–1.0)
Monocytes Relative: 6 %
Neutro Abs: 5.5 10*3/uL (ref 1.7–7.7)
Neutrophils Relative %: 51 %
Platelets: 219 10*3/uL (ref 150–400)
RBC: 4.07 MIL/uL (ref 3.87–5.11)
RDW: 16.3 % — ABNORMAL HIGH (ref 11.5–15.5)
WBC: 10.7 10*3/uL — ABNORMAL HIGH (ref 4.0–10.5)
nRBC: 0 % (ref 0.0–0.2)

## 2022-11-02 LAB — URINALYSIS, ROUTINE W REFLEX MICROSCOPIC
Bacteria, UA: NONE SEEN
Bilirubin Urine: NEGATIVE
Glucose, UA: >=500 mg/dL — AB
Ketones, ur: NEGATIVE mg/dL
Leukocytes,Ua: NEGATIVE
Nitrite: NEGATIVE
Protein, ur: NEGATIVE mg/dL
Specific Gravity, Urine: 1.014 (ref 1.005–1.030)
pH: 5 (ref 5.0–8.0)

## 2022-11-02 LAB — COMPREHENSIVE METABOLIC PANEL
ALT: 27 U/L (ref 0–44)
AST: 27 U/L (ref 15–41)
Albumin: 3.6 g/dL (ref 3.5–5.0)
Alkaline Phosphatase: 61 U/L (ref 38–126)
Anion gap: 7 (ref 5–15)
BUN: 8 mg/dL (ref 6–20)
CO2: 24 mmol/L (ref 22–32)
Calcium: 8.6 mg/dL — ABNORMAL LOW (ref 8.9–10.3)
Chloride: 105 mmol/L (ref 98–111)
Creatinine, Ser: 0.93 mg/dL (ref 0.44–1.00)
GFR, Estimated: >60 mL/min (ref >60)
Glucose, Bld: 242 mg/dL — ABNORMAL HIGH (ref 70–99)
Potassium: 3.2 mmol/L — ABNORMAL LOW (ref 3.5–5.1)
Sodium: 136 mmol/L (ref 135–145)
Total Bilirubin: 0.4 mg/dL (ref 0.3–1.2)
Total Protein: 6.9 g/dL (ref 6.5–8.1)

## 2022-11-02 LAB — GC/CHLAMYDIA PROBE AMP (~~LOC~~) NOT AT ARMC
Chlamydia: NEGATIVE
Comment: NEGATIVE
Comment: NORMAL
Neisseria Gonorrhea: NEGATIVE

## 2022-11-02 LAB — PREGNANCY, URINE: Preg Test, Ur: NEGATIVE

## 2022-11-02 LAB — WET PREP, GENITAL
Clue Cells Wet Prep HPF POC: NONE SEEN
Sperm: NONE SEEN
Trich, Wet Prep: NONE SEEN
WBC, Wet Prep HPF POC: <10 (ref <10)
Yeast Wet Prep HPF POC: NONE SEEN

## 2022-11-02 LAB — LIPASE, BLOOD: Lipase: 64 U/L — ABNORMAL HIGH (ref 11–51)

## 2022-11-02 NOTE — Discharge Instructions (Signed)
Testing is reassuring.  Follow your results on MyChart tomorrow for your gonorrhea and Chlamydia results.  Return to the ED with worsening pain, fever, vomiting or other concerns.

## 2022-11-15 ENCOUNTER — Ambulatory Visit (HOSPITAL_COMMUNITY)
Admission: EM | Admit: 2022-11-15 | Discharge: 2022-11-15 | Disposition: A | Discharge status: Home / Self Care | Payer: 59

## 2022-11-15 DIAGNOSIS — Z79899 Other long term (current) drug therapy: Secondary | ICD-10-CM | POA: Diagnosis not present

## 2022-11-15 NOTE — Progress Notes (Signed)
   11/15/22 1538  [facility] Triage Screening (Walk-ins at [facility] only)  How Did You Hear About [place]? Self  What Is the Reason for Your Visit/Call Today? Patient is a 32 year old female who arrives by [employer] voluntary to [facility] presenting with passive S/I (patient has multiple plans to self harm) although denies any active intent or H/I. Patient reports active AVH although is vague in reference to content stating, "she just sees two women who are talking to her." Patient reports current medication compliance although states her medications are not working as indicated. Patient states she, "just needs to get something to calm her nerves."  How Long Has This Been Causing You Problems? 1 wk - 1 month  Have You Recently Had Any Thoughts About Hurting Yourself? No  How long ago did you have thoughts about hurting yourself? Patient is vague in reference to S/I  Are You Planning to Commit Suicide/Harm Yourself At This time? No  Have you Recently Had Thoughts About Hurting Someone [name]? No  Are You Planning To Harm Someone At This Time? No  Explanation: NA  Are you currently experiencing any auditory, visual or other hallucinations? Yes  Please explain the hallucinations you are currently experiencing: Patient states she, "sees and hears two women"  Have You Used Any Alcohol or Drugs in the Past 24 Hours? No  What Did You Use and How Much? NA  Do you have any current medical co-morbidities that require immediate attention? No  Clinician description of patient physical appearance/behavior: Patient presents with a pleasant affect  What Do You Feel Would Help You the Most Today? Medication(s)  If access to [facility] was not available, would you have sought care in the Emergency Department? No  Determination of Need Routine (7 days)  Options For Referral Outpatient Therapy

## 2022-11-15 NOTE — Discharge Instructions (Signed)
Take all of you medications as prescribed by your mental healthcare provider. Report any adverse effects and reactions from your medications to your outpatient provider promptly.  Do not engage in alcohol and or illegal drug use while on prescription medicines.  Keep all scheduled appointments. This is to ensure that you are getting refills on time and to avoid any interruption in your medication.  If you are unable to keep an appointment call to reschedule.  Be sure to follow up with resources and follow ups given.  In the event of worsening symptoms call the crisis hotline, 911, and or go to the nearest emergency department for appropriate evaluation and treatment of symptoms.  Follow-up with your primary care provider for your medical issues, concerns and or health care needs.    

## 2022-11-15 NOTE — ED Notes (Signed)
While speaking to patient after she was roomed, she said she was only here due to medication issues.

## 2022-11-15 NOTE — ED Notes (Signed)
While speaking with patient with Stacy Drought NP, and [name] TTS present , [name] said she is not suicidal nor homicidal. The patient was asked if she needed medication she stated she will take her medication when she gets home that she only needs a taxi voucher.

## 2022-11-15 NOTE — ED Notes (Signed)
Pt discharged with  AVS.  AVS reviewed prior to discharge.  Pt alert, oriented, and ambulatory.  Safety maintained. Patient was providered a taxi voucher

## 2022-11-15 NOTE — ED Provider Notes (Cosign Needed Addendum)
Behavioral Health Urgent Care Medical Screening Exam  Patient Name: [name] MRN: [id number] Date of Evaluation: 11/15/22 Chief Complaint:  " I didn't take my morning medications.'  Diagnosis:  Final diagnoses:  Medication management    History of Present illness: [name] is a 32 y.o. female.  Presents to [facility] urgent care accompanied by [name].  [name] is well-known to this service.  She has a charted history of suicidal ideations, major depressive disorder with psychosis, schizoaffective disorder, intellectual disability, bipolar 1 disorder and adjustment disorder.  Reports she is currently prescribed Haldol, gabapentin and trazodone.  She reports taking and tolerating well.  States she just missed her morning dose of medications.    Per TTS counselor patient reports passive suicidal ideation with thoughts of self harming.  Patient was seen and evaluated face-to-face by this provider.  States " I did not take my morning medications" she is denying suicidal or homicidal ideations.  Reports auditory hallucinations.  States she hears female chatter voices.  Denies that they are command in nature.  Patient is requesting to discharge back home.  Seeking a taxi voucher in order to return to her residence.  She states she needs to contact her mother.  Patient was escorted to the lobby.  States she got overwhelmed because she feels her boyfriend needs to get his monthly injection.  Denied illicit drug use or substance abuse history.  Provider offer to provide patient with morning medication dose she declined stating " I will take my medicines when I get home."  Support., encouragement  and reassurance was provided.   During evaluation Maureena Virtie [name] is sitting in no acute distress. She is alert/oriented x 4; calm/cooperative; and mood congruent with affect. She is speaking in a clear tone at moderate volume, and normal pace; with good eye contact.   Her thought process is coherent and relevant; There is no indication that she is currently responding to internal/external stimuli or experiencing delusional thought content; and she has denied suicidal/self-harm/homicidal ideation, psychosis, and paranoia.   Patient has remained calm throughout assessment and has answered questions appropriately.    At this time [name] is educated and verbalizes understanding of mental health resources and other crisis services in the community.She is instructed to call 911 and present to the nearest emergency room should she experience any suicidal/homicidal ideation, auditory/visual/hallucinations, or detrimental worsening of her mental health condition.She was a also advised by Clinical research associate that she could call the toll-free phone on insurance card to assist with identifying in network counselors and agencies or number on back of Medicaid card to speak with care coordinator   Flowsheet Row ED from 11/15/2022 in [facility] ED from 11/01/2022 in [facility] Emergency Department at [facility] ED from 09/28/2022 in [facility]  C-SSRS RISK CATEGORY No Risk No Risk High Risk       Psychiatric Specialty Exam  Presentation  General Appearance:Appropriate for Environment  Eye Contact:Good  Speech:Clear and Coherent  Speech Volume:Normal  Handedness:Right   Mood and Affect  Mood: Euthymic  Affect: Congruent   Thought Process  Thought Processes: Coherent  Descriptions of Associations:Intact  Orientation:Full (Time, Place and Person)  Thought Content:Logical  Diagnosis of Schizophrenia or Schizoaffective disorder in past: Yes   Hallucinations:None; Auditory hearing 'women chattering." seeing demon and witches  Ideas of Reference:None  Suicidal Thoughts:No Without Intent; Without Plan With Intent  Homicidal Thoughts:No Without Intent; Without Plan;  Without Means to [employer]  Memory: Immediate Fair; Recent Fair; Remote Fair  Judgment: Fair  Insight: Fair   Chartered certified accountant: Fair  Attention Span: Fair  Recall: [employer] of Knowledge: Fair  Language: Fair   Psychomotor Activity  Psychomotor Activity: Normal No   Assets  Assets: Desire for Improvement   Sleep  Sleep: Fair  Number of hours:  2   Physical Exam: Physical Exam Vitals and nursing note reviewed.  Cardiovascular:     Rate and Rhythm: Normal rate.  Psychiatric:        Mood and Affect: Mood normal.        Behavior: Behavior normal.    Review of Systems  Psychiatric/Behavioral:  Positive for depression. Negative for suicidal ideas. The patient is nervous/anxious.   All other systems reviewed and are negative.  Blood pressure 134/88, pulse 93, temperature 97.9 F (36.6 C), temperature source Oral, resp. rate 20, last menstrual period 10/31/2022, SpO2 100%. There is no height or weight on file to calculate BMI.  Musculoskeletal: Strength & Muscle Tone: within normal limits Gait & Station: normal Patient leans: N/A   [facility] MSE Discharge Disposition for Follow up and Recommendations: Based on my evaluation the patient does not appear to have an emergency medical condition and can be discharged with resources and follow up care in outpatient services for Medication Management, Individual Therapy, Group Therapy, and ACTT Services   Stacy Rack, NP 11/15/2022, 3:54 PM

## 2022-11-26 ENCOUNTER — Emergency Department (HOSPITAL_COMMUNITY)
Admission: EM | Admit: 2022-11-26 | Discharge: 2022-11-26 | Discharge status: Against Medical Advice | Payer: 59 | Attending: Emergency Medicine | Admitting: Emergency Medicine

## 2022-11-26 DIAGNOSIS — F419 Anxiety disorder, unspecified: Secondary | ICD-10-CM | POA: Diagnosis not present

## 2022-11-26 DIAGNOSIS — I10 Essential (primary) hypertension: Secondary | ICD-10-CM | POA: Diagnosis not present

## 2022-11-26 DIAGNOSIS — Z5321 Procedure and treatment not carried out due to patient leaving prior to being seen by health care provider: Secondary | ICD-10-CM | POA: Diagnosis not present

## 2022-11-26 DIAGNOSIS — G4489 Other headache syndrome: Secondary | ICD-10-CM | POA: Diagnosis not present

## 2022-11-26 DIAGNOSIS — R457 State of emotional shock and stress, unspecified: Secondary | ICD-10-CM | POA: Diagnosis not present

## 2022-11-26 NOTE — ED Notes (Signed)
Patient asking if she has to stay to see the doctor or if she can just leave, explained to patient we would encourage her to stay but patient declines stating she feels great and wants to go ahead and leave. Exited out of triage area with no difficulty

## 2022-11-26 NOTE — ED Notes (Signed)
Attempted to get vital signs, patient continues to step out of the room to talk to staff at the desk

## 2022-11-26 NOTE — ED Triage Notes (Signed)
Patient arrived via [facility] originally complaining of anxiety after smoking weed, on arrival stating she is no longer having anxiety and wanted to just be checked out and leave.

## 2022-11-29 ENCOUNTER — Emergency Department (HOSPITAL_COMMUNITY)
Admission: EM | Admit: 2022-11-29 | Discharge: 2022-11-29 | Disposition: A | Discharge status: Home / Self Care | Payer: 59 | Attending: Emergency Medicine | Admitting: Emergency Medicine

## 2022-11-29 ENCOUNTER — Other Ambulatory Visit: Payer: Self-pay

## 2022-11-29 ENCOUNTER — Encounter (HOSPITAL_COMMUNITY): Payer: Self-pay | Admitting: Radiology

## 2022-11-29 ENCOUNTER — Emergency Department (HOSPITAL_COMMUNITY): Payer: 59

## 2022-11-29 DIAGNOSIS — R457 State of emotional shock and stress, unspecified: Secondary | ICD-10-CM | POA: Diagnosis not present

## 2022-11-29 DIAGNOSIS — R739 Hyperglycemia, unspecified: Secondary | ICD-10-CM | POA: Diagnosis not present

## 2022-11-29 DIAGNOSIS — F410 Panic disorder [episodic paroxysmal anxiety] without agoraphobia: Secondary | ICD-10-CM | POA: Diagnosis not present

## 2022-11-29 DIAGNOSIS — I10 Essential (primary) hypertension: Secondary | ICD-10-CM | POA: Diagnosis not present

## 2022-11-29 DIAGNOSIS — R0602 Shortness of breath: Secondary | ICD-10-CM | POA: Diagnosis not present

## 2022-11-29 DIAGNOSIS — F419 Anxiety disorder, unspecified: Secondary | ICD-10-CM | POA: Diagnosis not present

## 2022-11-29 NOTE — ED Provider Notes (Incomplete)
  [facility] DEPT [facility] Emergency Department Provider Note MRN:  [id number]  Arrival date & time: 11/29/22     Chief Complaint   Panic Attack   History of Present Illness   [name] is a 32 y.o. year-old female presents to the ED with chief complaint of ***.  {rbhistorian:27070} {RB interpreter (Optional):27221}  Review of Systems  Pertinent positive and negative review of systems noted in HPI.    Physical Exam   Vitals:   11/29/22 2246 11/29/22 2339  BP:  121/67  Pulse:  89  Resp:  (!) 25  Temp:  98.7 F (37.1 C)  SpO2: 100% 98%    CONSTITUTIONAL:  ***-appearing, NAD NEURO:  Alert and oriented x 3, CN 3-12 grossly intact*** EYES:  eyes equal and reactive ENT/NECK:  Supple, no stridor *** CARDIO:  ***, ***regular rhythm, appears well-perfused *** PULM:  No respiratory distress***, *** GI/GU:  non-distended, *** MSK/SPINE:  No gross deformities, no edema, moves all extremities *** SKIN:  no rash, atraumatic***   *Additional and/or pertinent findings included in MDM below  Diagnostic and Interventional Summary    EKG Interpretation Date/Time:  Sunday November 29 2022 23:38:21 EDT Ventricular Rate:  89 PR Interval:  189 QRS Duration:  93 QT Interval:  384 QTC Calculation: 468 R Axis:   57  Text Interpretation: Sinus rhythm Borderline T wave abnormalities Confirmed by Gloris Manchester ([id number]) on 11/29/2022 11:40:24 PM      *** Labs Reviewed - No data to display  DG Chest Port 1 View  Final Result      Medications - No data to display   Procedures  /  Critical Care Procedures  ED Course and Medical Decision Making  I have reviewed the triage vital signs, the nursing notes, and pertinent available records from the EMR.  Social Determinants Affecting Complexity of Care: Patient {rbSocial Determinants:27067}. {rbsocialsolutions:27068}  ED Course:    Medical Decision Making Amount and/or Complexity of Data Reviewed Radiology:  ordered. ECG/medicine tests: ordered.      {rbcpddx (Optional):29772:::1} {rbabdddx (Optional):29773:s::1}  Consultants: {rbconsultants:27072}   Treatment and Plan: {rbadmissionvdc:27069}  {rbattending:27073}  Final Clinical Impressions(s) / ED Diagnoses     ICD-10-CM   1. Panic attack  F41.0       ED Discharge Orders     None         Discharge Instructions Discussed with and Provided to Patient:    Discharge Instructions      Your chest x-ray and EKG looked normal.  Your symptoms are thought to be from smoking marijuana.  I recommend stopping.  Please follow-up with your doctor.

## 2022-11-29 NOTE — ED Triage Notes (Signed)
PT picked up from home with complaints of having a panic attack. Pt has been seen here often for the same. Pt states she smoked marijuana a week ago and has had panic attack every since. Pt calm in the room at this time.

## 2022-11-29 NOTE — Discharge Instructions (Signed)
Your chest x-ray and EKG looked normal.  Your symptoms are thought to be from smoking marijuana.  I recommend stopping.  Please follow-up with your doctor.

## 2022-11-29 NOTE — ED Provider Notes (Signed)
[facility]PT [facility] Emergency Department Provider Note MRN:  [id number]  Arrival date & time: 11/29/22     Chief Complaint   Panic Attack   History of Present Illness   [name] is a 32 y.o. year-old female presents to the ED with chief complaint of anxiety.  She states that she feels anxious after smoking marijuana.  She states that this is not a new problem for her.  She denies chest pain.  She states that she feels slightly out of breath, but attributes this to anxiety.  She denies any recent illnesses.  She is well-known to this department..  History provided by patient.   Review of Systems  Pertinent positive and negative review of systems noted in HPI.    Physical Exam   Vitals:   11/29/22 2246 11/29/22 2339  BP:  121/67  Pulse:  89  Resp:  (!) 25  Temp:  98.7 F (37.1 C)  SpO2: 100% 98%    CONSTITUTIONAL:  well-appearing, NAD NEURO:  Alert and oriented x 3, CN 3-12 grossly intact EYES:  eyes equal and reactive ENT/NECK:  Supple, no stridor  CARDIO:  normal rate, regular rhythm, appears well-perfused  PULM:  No respiratory distress, CTAB GI/GU:  non-distended,  MSK/SPINE:  No gross deformities, no edema, moves all extremities  SKIN:  no rash, atraumatic   *Additional and/or pertinent findings included in MDM below  Diagnostic and Interventional Summary    EKG Interpretation Date/Time:  Sunday November 29 2022 23:38:21 EDT Ventricular Rate:  89 PR Interval:  189 QRS Duration:  93 QT Interval:  384 QTC Calculation: 468 R Axis:   57  Text Interpretation: Sinus rhythm Borderline T wave abnormalities Confirmed by Gloris Manchester ([id number]) on 11/29/2022 11:40:24 PM       Labs Reviewed - No data to display  DG Chest Port 1 View  Final Result      Medications - No data to display   Procedures  /  Critical Care Procedures  ED Course and Medical Decision Making  I have reviewed the triage vital signs, the nursing notes, and pertinent  available records from the EMR.  Social Determinants Affecting Complexity of Care: Patient has no clinically significant social determinants affecting this chief complaint..   ED Course:    Medical Decision Making Patient here with anxiety attack after smoking marijuana.  She feels improved now.  Vital signs are stable.  Chest x-ray and EKG are reassuring.  Discharged home with PCP follow-up.  Amount and/or Complexity of Data Reviewed Radiology: ordered and independent interpretation performed.    Details: No pneumothorax ECG/medicine tests: ordered.         Consultants: No consultations were needed in caring for this patient.   Treatment and Plan: Emergency department workup does not suggest an emergent condition requiring admission or immediate intervention beyond  what has been performed at this time. The patient is safe for discharge and has  been instructed to return immediately for worsening symptoms, change in  symptoms or any other concerns    Final Clinical Impressions(s) / ED Diagnoses     ICD-10-CM   1. Panic attack  F41.0       ED Discharge Orders     None         Discharge Instructions Discussed with and Provided to Patient:   Discharge Instructions      Your chest x-ray and EKG looked normal.  Your symptoms are thought to be from smoking marijuana.  I recommend stopping.  Please follow-up with your doctor.      Roxy Horseman, PA-C 11/30/22 Sindy Messing, MD 12/02/22 [phone]

## 2022-12-05 ENCOUNTER — Encounter (HOSPITAL_COMMUNITY): Payer: Self-pay

## 2022-12-05 ENCOUNTER — Emergency Department (HOSPITAL_COMMUNITY)
Admission: EM | Admit: 2022-12-05 | Discharge: 2022-12-06 | Disposition: A | Discharge status: Home / Self Care | Payer: 59 | Attending: Emergency Medicine | Admitting: Emergency Medicine

## 2022-12-05 ENCOUNTER — Other Ambulatory Visit: Payer: Self-pay

## 2022-12-05 DIAGNOSIS — F17210 Nicotine dependence, cigarettes, uncomplicated: Secondary | ICD-10-CM | POA: Diagnosis not present

## 2022-12-05 DIAGNOSIS — F329 Major depressive disorder, single episode, unspecified: Secondary | ICD-10-CM | POA: Insufficient documentation

## 2022-12-05 DIAGNOSIS — Z79899 Other long term (current) drug therapy: Secondary | ICD-10-CM | POA: Insufficient documentation

## 2022-12-05 DIAGNOSIS — Z794 Long term (current) use of insulin: Secondary | ICD-10-CM | POA: Diagnosis not present

## 2022-12-05 DIAGNOSIS — T43292A Poisoning by other antidepressants, intentional self-harm, initial encounter: Secondary | ICD-10-CM | POA: Diagnosis not present

## 2022-12-05 DIAGNOSIS — I10 Essential (primary) hypertension: Secondary | ICD-10-CM | POA: Diagnosis not present

## 2022-12-05 DIAGNOSIS — F4329 Adjustment disorder with other symptoms: Secondary | ICD-10-CM | POA: Diagnosis not present

## 2022-12-05 DIAGNOSIS — T50902A Poisoning by unspecified drugs, medicaments and biological substances, intentional self-harm, initial encounter: Secondary | ICD-10-CM

## 2022-12-05 DIAGNOSIS — X838XXA Intentional self-harm by other specified means, initial encounter: Secondary | ICD-10-CM | POA: Insufficient documentation

## 2022-12-05 DIAGNOSIS — T1491XA Suicide attempt, initial encounter: Secondary | ICD-10-CM | POA: Insufficient documentation

## 2022-12-05 DIAGNOSIS — R4183 Borderline intellectual functioning: Secondary | ICD-10-CM

## 2022-12-05 DIAGNOSIS — E876 Hypokalemia: Secondary | ICD-10-CM | POA: Diagnosis not present

## 2022-12-05 DIAGNOSIS — F4325 Adjustment disorder with mixed disturbance of emotions and conduct: Secondary | ICD-10-CM | POA: Insufficient documentation

## 2022-12-05 DIAGNOSIS — E119 Type 2 diabetes mellitus without complications: Secondary | ICD-10-CM | POA: Insufficient documentation

## 2022-12-05 DIAGNOSIS — I959 Hypotension, unspecified: Secondary | ICD-10-CM | POA: Diagnosis not present

## 2022-12-05 DIAGNOSIS — R739 Hyperglycemia, unspecified: Secondary | ICD-10-CM | POA: Diagnosis not present

## 2022-12-05 NOTE — ED Triage Notes (Signed)
Pt said that she want to end her life by overdosing her pills of clomipramine 25mg , pt took 7 pills as verbalized.

## 2022-12-06 ENCOUNTER — Other Ambulatory Visit: Payer: Self-pay

## 2022-12-06 DIAGNOSIS — F4325 Adjustment disorder with mixed disturbance of emotions and conduct: Secondary | ICD-10-CM | POA: Diagnosis not present

## 2022-12-06 DIAGNOSIS — F4329 Adjustment disorder with other symptoms: Secondary | ICD-10-CM | POA: Diagnosis not present

## 2022-12-06 LAB — COMPREHENSIVE METABOLIC PANEL WITH GFR
ALT: 32 U/L (ref 0–44)
AST: 33 U/L (ref 15–41)
Albumin: 4.4 g/dL (ref 3.5–5.0)
Alkaline Phosphatase: 67 U/L (ref 38–126)
Anion gap: 9 (ref 5–15)
BUN: 13 mg/dL (ref 6–20)
CO2: 24 mmol/L (ref 22–32)
Calcium: 9.3 mg/dL (ref 8.9–10.3)
Chloride: 102 mmol/L (ref 98–111)
Creatinine, Ser: 0.87 mg/dL (ref 0.44–1.00)
GFR, Estimated: >60 mL/min (ref >60)
Glucose, Bld: 189 mg/dL — ABNORMAL HIGH (ref 70–99)
Potassium: 3.2 mmol/L — ABNORMAL LOW (ref 3.5–5.1)
Sodium: 135 mmol/L (ref 135–145)
Total Bilirubin: 0.6 mg/dL (ref 0.3–1.2)
Total Protein: 7.9 g/dL (ref 6.5–8.1)

## 2022-12-06 LAB — CBC WITH DIFFERENTIAL/PLATELET
Abs Immature Granulocytes: 0.04 10*3/uL (ref 0.00–0.07)
Basophils Absolute: 0.1 10*3/uL (ref 0.0–0.1)
Basophils Relative: 1 %
Eosinophils Absolute: 0.5 10*3/uL (ref 0.0–0.5)
Eosinophils Relative: 3 %
HCT: 38 % (ref 36.0–46.0)
Hemoglobin: 11.9 g/dL — ABNORMAL LOW (ref 12.0–15.0)
Immature Granulocytes: 0 %
Lymphocytes Relative: 34 %
Lymphs Abs: 5.3 10*3/uL — ABNORMAL HIGH (ref 0.7–4.0)
MCH: 24.5 pg — ABNORMAL LOW (ref 26.0–34.0)
MCHC: 31.3 g/dL (ref 30.0–36.0)
MCV: 78.2 fL — ABNORMAL LOW (ref 80.0–100.0)
Monocytes Absolute: 0.9 10*3/uL (ref 0.1–1.0)
Monocytes Relative: 6 %
Neutro Abs: 8.8 10*3/uL — ABNORMAL HIGH (ref 1.7–7.7)
Neutrophils Relative %: 56 %
Platelets: 286 10*3/uL (ref 150–400)
RBC: 4.86 MIL/uL (ref 3.87–5.11)
RDW: 16 % — ABNORMAL HIGH (ref 11.5–15.5)
WBC: 15.6 10*3/uL — ABNORMAL HIGH (ref 4.0–10.5)
nRBC: 0 % (ref 0.0–0.2)

## 2022-12-06 LAB — RAPID URINE DRUG SCREEN, HOSP PERFORMED
Amphetamines: NOT DETECTED
Barbiturates: NOT DETECTED
Benzodiazepines: NOT DETECTED
Cocaine: NOT DETECTED
Opiates: NOT DETECTED
Tetrahydrocannabinol: NOT DETECTED

## 2022-12-06 LAB — ACETAMINOPHEN LEVEL
Acetaminophen (Tylenol), Serum: <10 ug/mL — ABNORMAL LOW (ref 10–30)
Acetaminophen (Tylenol), Serum: <10 ug/mL — ABNORMAL LOW (ref 10–30)

## 2022-12-06 LAB — HCG, SERUM, QUALITATIVE: Preg, Serum: NEGATIVE

## 2022-12-06 LAB — CBG MONITORING, ED: Glucose-Capillary: 196 mg/dL — ABNORMAL HIGH (ref 70–99)

## 2022-12-06 MED ORDER — ALUM & MAG HYDROXIDE-SIMETH 200-200-20 MG/5ML PO SUSP: 30.0000 mL | Freq: Four times a day (QID) | ORAL | Status: DC | PRN

## 2022-12-06 MED ORDER — ACETAMINOPHEN 325 MG PO TABS: 650.0000 mg | ORAL_TABLET | ORAL | Status: DC | PRN

## 2022-12-06 MED ORDER — ONDANSETRON HCL 4 MG PO TABS: 4.0000 mg | ORAL_TABLET | Freq: Three times a day (TID) | ORAL | Status: DC | PRN

## 2022-12-06 MED ORDER — POTASSIUM CHLORIDE CRYS ER 20 MEQ PO TBCR
40.0000 meq | EXTENDED_RELEASE_TABLET | Freq: Once | ORAL | Status: AC
  Administered 2022-12-06: 40 meq via ORAL
  Filled 2022-12-06: qty 2

## 2022-12-06 NOTE — BH Assessment (Signed)
Comprehensive Clinical Assessment (CCA) Note   12/06/2022 [name] [id number]  Disposition: Stacy Bering, NP recommends continuous observation. Pt to be reassessed by AM provider. Cy Blamer, MD notified.   The patient demonstrates the following risk factors for suicide: Chronic risk factors for suicide include: previous suicide attempts   . Acute risk factors for suicide include: family or marital conflict. Protective factors for this patient include: positive therapeutic relationship. Considering these factors, the overall suicide risk at this point appears to be high. Patient is not appropriate for outpatient follow up.    Pt present to [facility] due to pt reporting overdose. Pt reported that he ingested 5 (25 mg )Clomipramine pills. Pt reported that she was upset because her mother would not let her see her mother's puppy. Pt reports that she currently is not suicidal. Pt reports that she feels better. Pt denies HI and AVH. Pt has hx of Schizoaffective Disorder, bipolar type, Borderline Intellectual functioning and Generalized Anxiety Disorder. Pt denies etoh and drug use.  Per chart, pt engages in ACTT services.  Pt was dressed in casually and groomed appropriately. Pt is alert, oriented x4 with normal speech and normal motor behavior. Eye contact is good. Pt's mood is depressed, and affect is flat. Thought process is coherent and relevant. Pt's insight is fair and judgement is poor. There is no indication pt is currently responding to internal stimuli or experiencing delusional thought content. Pt was cooperative throughout assessment.      Chief Complaint:  Chief Complaint  Patient presents with   Suicide Attempt    Want to end her life by overdosing her pill (clomipramine 25mg ) pt took 7 pills    Visit Diagnosis:  Schizoaffective Disorder, bipolar type  Borderline Intellectual functioning   Generalized Anxiety Disorde      CCA Screening, Triage and Referral  (STR)  Patient Reported Information How did you hear about [place]? Other (Comment) ([facility])  What Is the Reason for Your Visit/Call Today? Pt present to [facility] due to pt reporting overdose. Pt reported that he ingested 5 (25 mg )Clomipramine pills. Pt reported that she was upset because her mother would not let her see her mother's puppy. Pt reports that she currently is not suicidal. Pt reports that she feels better. Pt denies HI and AVH. Pt has hx of Schizoaffective Disorder, bipolar type, Borderline Intellectual functioning and Generalized Anxiety Disorder. Pt denies etoh and drug use.  Per chart, pt engages in ACTT services.  How Long Has This Been Causing You Problems? <Week  What Do You Feel Would Help You the Most Today? Treatment for Depression or other mood problem; Stress Management   Have You Recently Had Any Thoughts About Hurting Yourself? Yes  Are You Planning to Commit Suicide/Harm Yourself At This time? Yes   Flowsheet Row ED from 12/05/2022 in [facility] Emergency Department at [facility] ED from 11/29/2022 in [facility] Emergency Department at [facility] ED from 11/26/2022 in [facility] Emergency Department at [facility]  C-SSRS RISK CATEGORY High Risk No Risk No Risk       Have you Recently Had Thoughts About Hurting Someone [name]? No  Are You Planning to Harm Someone at This Time? No  Explanation: Pt denies HI   Have You Used Any Alcohol or Drugs in the Past 24 Hours? No  What Did You Use and How Much? Pt denies etoh and drug use.   Do You Currently Have a Therapist/Psychiatrist? No  Name of  Therapist/Psychiatrist: Name of Therapist/Psychiatrist: Pt engages in ACTT services   Have You Been Recently Discharged From Any Office Practice or Programs? No  Explanation of Discharge From Practice/Program: n/a     CCA Screening Triage Referral Assessment Type of Contact: Tele-Assessment  Telemedicine Service Delivery: Telemedicine service  delivery: This service was provided via telemedicine using a 2-way, interactive audio and video technology  Is this Initial or Reassessment? Is this Initial or Reassessment?: Initial Assessment  Date Telepsych consult ordered in [facility]:  Date Telepsych consult ordered in [facility]: 12/06/22  Time Telepsych consult ordered in [facility]:  Time Telepsych consult ordered in [facility]: 0428  Location of Assessment: [facility] ED  Provider Location: [facility] Assessment Services   Collateral Involvement: N/A   Does Patient Have a Automotive engineer Guardian? No  Legal Guardian Contact Information: n/a  Copy of Legal Guardianship Form: -- (n/a)  Legal Guardian Notified of Arrival: -- (n/a)  Legal Guardian Notified of Pending Discharge: -- (n/a)  If Minor and Not Living with Parent(s), Who has Custody? n/a  Is CPS involved or ever been involved? Never  Is APS involved or ever been involved? Never   Patient Determined To Be At Risk for Harm To Self or Others Based on Review of Patient Reported Information or Presenting Complaint? Yes, for Self-Harm  Method: Plan with intent and identified person  Availability of Means: In hand or used  Intent: Intends to cause physical harm but not necessarily death  Notification Required: No need or identified person (N/A, no HI)  Additional Information for Danger to Others Potential: -- (N/A, no HI)  Additional Comments for Danger to Others Potential: N/A, no HI  Are There Guns or Other Weapons in Your Home? No  Types of Guns/Weapons: N/A  Are These Weapons Safely Secured?                            No (N/A)  Who Could Verify You Are Able To Have These Secured: N/A  Do You Have any Outstanding Charges, Pending Court Dates, Parole/Probation? Pt denies pending legal charges  Contacted To Inform of Risk of Harm To Self or Others: -- (n/a)    Does Patient Present under Involuntary Commitment? No    [place] of Residence: [place]   Patient Currently  Receiving the Following Services: Not Receiving Services   Determination of Need: Urgent (48 hours)   Options For Referral: Inpatient Hospitalization     CCA Biopsychosocial Patient Reported Schizophrenia/Schizoaffective Diagnosis in Past: No   Strengths: Patient is engaged in ACTT services.   Mental Health Symptoms Depression:   Sleep (too much or little); Irritability; Hopelessness; Worthlessness   Duration of Depressive symptoms:  Duration of Depressive Symptoms: Less than two weeks   Mania:   Recklessness; Racing thoughts; Irritability   Anxiety:    Worrying; Tension   Psychosis:   None   Duration of Psychotic symptoms:  Duration of Psychotic Symptoms: N/A   Trauma:   None   Obsessions:   None   Compulsions:   None   Inattention:   None   Hyperactivity/Impulsivity:   N/A   Oppositional/Defiant Behaviors:   N/A   Emotional Irregularity:   N/A   Other Mood/Personality Symptoms:   N/A    Mental Status Exam Appearance and self-care  Stature:   Average   Weight:   Obese   Clothing:   Casual (Scrubs)   Grooming:   Normal   Cosmetic use:  Age appropriate   Posture/gait:   Tense   Motor activity:   Agitated   Sensorium  Attention:   Vigilant   Concentration:   Preoccupied; Variable   Orientation:   Object; Person; Place; Time   Recall/memory:   Normal   Affect and Mood  Affect:   Appropriate   Mood:   Negative; Irritable   Relating  Eye contact:   Normal   Facial expression:   Constricted   Attitude toward examiner:   Cooperative   Thought and Language  Speech flow:  Loud; Pressured   Thought content:   Appropriate to Mood and Circumstances   Preoccupation:   None   Hallucinations:   None   Organization:   Coherent   [employer] of Knowledge:   Average   Intelligence:   Below average   Abstraction:   Functional   Judgement:   Fair   Reality Testing:   Distorted    Insight:   Lacking   Decision Making:   Impulsive   Social Functioning  Social Maturity:   Impulsive; Irresponsible   Social Judgement:   Heedless   Stress  Stressors:   Family conflict   Coping Ability:   Human resources officer Deficits:   None; Communication; Intellect/education; Interpersonal; Self-control   Supports:   Friends/Service system; Support needed     Religion: Religion/Spirituality Are You A Religious Person?: No How Might This Affect Treatment?: N/A  Leisure/Recreation: Leisure / Recreation Do You Have Hobbies?: No  Exercise/Diet: Exercise/Diet Do You Exercise?: No Have You Gained or Lost A Significant Amount of Weight in the Past Six Months?: No Do You Follow a Special Diet?: No Do You Have Any Trouble Sleeping?: No Explanation of Sleeping Difficulties: n/a   CCA Employment/Education Employment/Work Situation: Employment / Work Situation Employment Situation: On disability Why is Patient on Disability: Mental Health How Long has Patient Been on Disability: UTA Patient's Job has Been Impacted by Current Illness: No Has Patient ever Been in the [employer]?: No  Education: Education Is Patient Currently Attending School?: No Last Grade Completed: 11 Did You Attend College?: No Did You Have An Individualized Education Program (IIEP): No Did You Have Any Difficulty At School?: No Patient's Education Has Been Impacted by Current Illness: No   CCA Family/Childhood History Family and Relationship History: Family history Marital status: Single Does patient have children?: Yes How many children?:  (UTA) How is patient's relationship with their children?: UTA  Childhood History:  Childhood History By whom was/is the patient raised?: Mother Did patient suffer any verbal/emotional/physical/sexual abuse as a child?: No Did patient suffer from severe childhood neglect?: No Has patient ever been sexually abused/assaulted/raped as an  adolescent or adult?: No Was the patient ever a victim of a crime or a disaster?: No Witnessed domestic violence?: No Has patient been affected by domestic violence as an adult?: No       CCA Substance Use Alcohol/Drug Use: Alcohol / Drug Use Pain Medications: See MAR Prescriptions: See MAR Over the Counter: See MAR History of alcohol / drug use?: No history of alcohol / drug abuse Longest period of sobriety (when/how long): N/A Negative Consequences of Use:  (N/A) Withdrawal Symptoms:  (N/A)                         ASAM's:  Six Dimensions of Multidimensional Assessment  Dimension 1:  Acute Intoxication and/or Withdrawal Potential:   Dimension 1:  Description of individual's  past and current experiences of substance use and withdrawal: NA  Dimension 2:  Biomedical Conditions and Complications:   Dimension 2:  Description of patient's biomedical conditions and  complications: NA  Dimension 3:  Emotional, Behavioral, or Cognitive Conditions and Complications:  Dimension 3:  Description of emotional, behavioral, or cognitive conditions and complications: NA  Dimension 4:  Readiness to Change:  Dimension 4:  Description of Readiness to Change criteria: NA  Dimension 5:  Relapse, Continued use, or Continued Problem Potential:  Dimension 5:  Relapse, continued use, or continued problem potential critiera description: NA  Dimension 6:  Recovery/Living Environment:  Dimension 6:  Recovery/Iiving environment criteria description: NA  ASAM Severity Score: ASAM's Severity Rating Score: 0  ASAM Recommended Level of Treatment: ASAM Recommended Level of Treatment:  (n/a)   Substance use Disorder (SUD) Substance Use Disorder (SUD)  Checklist Symptoms of Substance Use:  (NA)  Recommendations for Services/Supports/Treatments: Recommendations for Services/Supports/Treatments Recommendations For Services/Supports/Treatments: Medication Management, ACCTT (Assertive Community  Treatment), Other (Comment), Inpatient Hospitalization, Day Treatment  Discharge Disposition:    DSM5 Diagnoses: Patient Active Problem List   Diagnosis Date Noted   Agitation 09/28/2022   Suicidal ideations 01/04/2022   Suicidal overdose (HCC) 03/23/2021   Syphilis 07/15/2020   Malingering 06/05/2020   Gastroesophageal reflux disease 05/04/2020   Hyperglycemia due to type 2 diabetes mellitus (HCC) 05/04/2020   Long term (current) use of insulin (HCC) 05/04/2020   Migraine without aura 05/04/2020   Morbid obesity (HCC) 05/04/2020   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 2 diabetes mellitus (HCC) 05/04/2020   Prolapsed internal hemorrhoids 05/04/2020   Vitamin D deficiency 05/04/2020   Other symptoms and signs involving cognitive functions and awareness 05/04/2020   Suicide attempt (HCC)    Anxiety state 03/06/2020   Schizophrenia (HCC) 09/13/2019   Bipolar I disorder, most recent episode depressed (HCC) 06/23/2019   MDD (major depressive disorder) 10/10/2018   Schizoaffective disorder, bipolar type (HCC) 09/25/2018   Affective psychosis, bipolar (HCC) 06/13/2018   HTN (hypertension) 05/03/2018   Tobacco use disorder 05/03/2018   Adjustment disorder with emotional disturbance 01/02/2018   Schizophrenia, 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   Moderate bipolar I disorder, most recent episode depressed (HCC)    Schizoaffective disorder (HCC)    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/2017   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   OCD (obsessive compulsive disorder) 10/05/2016   Major depressive disorder, recurrent episode, mild (HCC) 05/04/2016   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   Learning disability 07/18/2015   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/2015   Diabetes mellitus (HCC) 07/18/2015   MDD (major depressive disorder), recurrent, severe, 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

## 2022-12-06 NOTE — ED Notes (Signed)
Pt transferred to room 27

## 2022-12-06 NOTE — ED Notes (Signed)
Pt requested nurse to call her mother and give update on care. Nurse called, ring no answer. No voicemail left.

## 2022-12-06 NOTE — ED Provider Notes (Signed)
Emergency Medicine Observation Re-evaluation Note  [name] is a 32 y.o. female, seen on rounds today.  Pt initially presented to the ED for complaints of Suicide Attempt (Want to end her life by overdosing her pill (clomipramine 25mg ) pt took 7 pills ) Currently, the patient is here for evaluation after si loc.  Physical Exam  BP 130/78 (BP Location: Left Arm)   Pulse 92   Temp 97.8 F (36.6 C) (Oral)   Resp (!) 23   Ht 1.702 m (5\' 7" )   Wt 116.6 kg   SpO2 97%   BMI 40.25 kg/m  Physical Exam General: wdwn nad Cardiac: rrr Lungs: no distress Psych: awake and alert,conversant, no si, ho  ED Course / MDM  EKG:EKG Interpretation Date/Time:  Sunday December 06 2022 03:05:40 EDT Ventricular Rate:  90 PR Interval:  170 QRS Duration:  96 QT Interval:  376 QTC Calculation: 461 R Axis:   47  Text Interpretation: Sinus rhythm Probable left ventricular hypertrophy Artifact in lead(s) I II aVR aVL aVF Confirmed by Nicanor Alcon, April ([id number]) on 12/06/2022 3:10:29 AM  I have reviewed the labs performed to date as well as medications administered while in observation.  Recent changes in the last 24 hours include cleared for d/c.  Plan  Current plan is for psych has placed d/c info Patient stable for d/c.    Margarita Grizzle, MD 12/06/22 1230

## 2022-12-06 NOTE — ED Provider Notes (Signed)
[place] EMERGENCY DEPARTMENT AT [facility] Provider Note   CSN: [id number] Arrival date & time: 12/05/22  2330     History  Chief Complaint  Patient presents with   Suicide Attempt    Want to end her life by overdosing her pill (clomipramine 25mg ) pt took 7 pills     [name] is a 32 y.o. female.  The history is provided by the patient.  Mental Health Problem Presenting symptoms: self-mutilation, suicidal thoughts and suicide attempt   Degree of incapacity (severity):  Moderate Onset quality:  Sudden Timing:  Constant Progression:  Unchanged Chronicity:  New Treatment compliance:  Some of the time Relieved by:  Nothing Worsened by:  Nothing Ineffective treatments:  None tried Associated symptoms: no chest pain and no weight change   Risk factors: hx of mental illness   Patient with Bipolar 1 disorder reportedly took 7 clomipramine to kill self.      Past Medical History:  Diagnosis Date   Anxiety    Bipolar 1 disorder (HCC)    Cognitive deficits    Depression    Diabetes mellitus without complication (HCC)    Hypertension    Mental disorder    Mental health disorder    Obesity      Home Medications Prior to Admission medications   Medication Sig Start Date End Date Taking? Authorizing Provider  amLODipine (NORVASC) 10 MG tablet Take 10 mg by mouth daily.    [provider]  benztropine (COGENTIN) 0.5 MG tablet Take 1 tablet (0.5 mg total) by mouth 2 (two) times daily. For eps 04/14/22   Armandina Stammer I, NP  Cholecalciferol (VITAMIN D3) 25 MCG (1000 UT) CAPS Take 1,000 Units by mouth daily.    [provider]  clomiPRAMINE (ANAFRANIL) 25 MG capsule Take 25 mg by mouth at bedtime. Take 1 capsule at bedtime for 3 days then 2 capsules for 3 days then 3 capsules at bedtime    [provider]  diclofenac Sodium (VOLTAREN) 1 % GEL Apply 2 g topically 4 (four) times daily. 06/07/22   Antony Madura, PA-C  gabapentin  (NEURONTIN) 100 MG capsule Take 1 capsule (100 mg total) by mouth 3 (three) times daily. For agitation 04/14/22   Armandina Stammer I, NP  haloperidol (HALDOL) 10 MG tablet Take 1 tablet (10 mg total) by mouth at bedtime. 09/29/22   Ardis Hughs, NP  insulin aspart protamine- aspart (NOVOLOG MIX 70/30) (70-30) 100 UNIT/ML injection Inject 20 Units into the skin 2 (two) times daily with a meal.    [provider]  lithium carbonate (ESKALITH) 450 MG ER tablet Take 1 tablet (450 mg total) by mouth at bedtime. For mood stabilization 04/14/22   Armandina Stammer I, NP  metoprolol tartrate (LOPRESSOR) 25 MG tablet Take 25 mg by mouth 2 (two) times daily.    [provider]  ondansetron (ZOFRAN-ODT) 4 MG disintegrating tablet Take 1 tablet (4 mg total) by mouth every 8 (eight) hours as needed for nausea or vomiting. 07/09/22   Pricilla Loveless, MD  simvastatin (ZOCOR) 40 MG tablet Take 40 mg by mouth at bedtime.    [provider]  traZODone (DESYREL) 50 MG tablet Take 1 tablet (50 mg total) by mouth at bedtime as needed for sleep. Patient not taking: Reported on 07/06/2022 04/14/22   Armandina Stammer I, NP  venlafaxine XR (EFFEXOR-XR) 150 MG 24 hr capsule Take 150 mg by mouth daily with breakfast.    [provider]      Allergies    Wellbutrin [bupropion], Omnipaque [iohexol], Penicillins, Atarax [hydroxyzine], Contrast media [iodinated contrast media], and Cymbalta [duloxetine hcl]    Review of Systems   Review of Systems  Constitutional:  Negative for fever.  HENT:  Negative for facial swelling.   Respiratory:  Negative for wheezing and stridor.   Cardiovascular:  Negative for chest pain.  Psychiatric/Behavioral:  Positive for self-injury and suicidal ideas.   All other systems reviewed and are negative.   Physical Exam Updated Vital Signs BP 130/78 (BP Location: Left Arm)   Pulse 92   Temp 97.8 F (36.6 C) (Oral)   Resp (!) 23   Ht 5\' 7"  (1.702 m)   Wt 116.6 kg    SpO2 97%   BMI 40.25 kg/m  Physical Exam Vitals and nursing note reviewed.  Constitutional:      General: She is not in acute distress.    Appearance: She is well-developed.  HENT:     Head: Normocephalic and atraumatic.     Nose: Nose normal.  Eyes:     Comments: Normal appearance  Cardiovascular:     Rate and Rhythm: Normal rate and regular rhythm.     Pulses: Normal pulses.     Heart sounds: Normal heart sounds.  Pulmonary:     Effort: Pulmonary effort is normal. No respiratory distress.     Breath sounds: Normal breath sounds.  Abdominal:     General: Bowel sounds are normal. There is no distension.     Palpations: Abdomen is soft. There is no mass.     Tenderness: There is no abdominal tenderness. There is no guarding or rebound.  Genitourinary:    Comments: No CVA tenderness Musculoskeletal:        General: Normal range of motion.     Cervical back: Normal range of motion.  Skin:    General: Skin is warm and dry.     Capillary Refill: Capillary refill takes less than 2 seconds.     Findings: No rash.  Neurological:     Mental Status: She is alert and oriented to person, place, and time.     Deep Tendon Reflexes: Reflexes normal.  Psychiatric:        Thought Content: Thought content includes suicidal ideation.     ED Results / Procedures / Treatments   Labs (all labs ordered are listed, but only abnormal results are displayed) Results for orders placed or performed during the hospital encounter of 12/05/22  Comprehensive metabolic panel  Result Value Ref Range   Sodium 135 135 - 145 mmol/L   Potassium 3.2 (L) 3.5 - 5.1 mmol/L   Chloride 102 98 - 111 mmol/L   CO2 24 22 - 32 mmol/L   Glucose, Bld 189 (H) 70 - 99 mg/dL   BUN 13 6 - 20 mg/dL   Creatinine, Ser 1.61 0.44 - 1.00 mg/dL   Calcium 9.3 8.9 - 09.6 mg/dL   Total Protein 7.9 6.5 - 8.1 g/dL   Albumin 4.4 3.5 - 5.0 g/dL   AST 33 15 - 41 U/L   ALT 32 0 - 44 U/L   Alkaline Phosphatase 67 38 - 126 U/L    Total Bilirubin 0.6 0.3 - 1.2 mg/dL   GFR, Estimated >04 >54 mL/min   Anion gap 9 5 - 15  Urine rapid drug screen (hosp performed)  Result Value Ref Range   Opiates NONE DETECTED NONE DETECTED   Cocaine NONE DETECTED NONE DETECTED  Benzodiazepines NONE DETECTED NONE DETECTED   Amphetamines NONE DETECTED NONE DETECTED   Tetrahydrocannabinol NONE DETECTED NONE DETECTED   Barbiturates NONE DETECTED NONE DETECTED  CBC WITH DIFFERENTIAL  Result Value Ref Range   WBC 15.6 (H) 4.0 - 10.5 K/uL   RBC 4.86 3.87 - 5.11 MIL/uL   Hemoglobin 11.9 (L) 12.0 - 15.0 g/dL   HCT 16.1 09.6 - 04.5 %   MCV 78.2 (L) 80.0 - 100.0 fL   MCH 24.5 (L) 26.0 - 34.0 pg   MCHC 31.3 30.0 - 36.0 g/dL   RDW 40.9 (H) 81.1 - 91.4 %   Platelets 286 150 - 400 K/uL   nRBC 0.0 0.0 - 0.2 %   Neutrophils Relative % 56 %   Neutro Abs 8.8 (H) 1.7 - 7.7 K/uL   Lymphocytes Relative 34 %   Lymphs Abs 5.3 (H) 0.7 - 4.0 K/uL   Monocytes Relative 6 %   Monocytes Absolute 0.9 0.1 - 1.0 K/uL   Eosinophils Relative 3 %   Eosinophils Absolute 0.5 0.0 - 0.5 K/uL   Basophils Relative 1 %   Basophils Absolute 0.1 0.0 - 0.1 K/uL   Immature Granulocytes 0 %   Abs Immature Granulocytes 0.04 0.00 - 0.07 K/uL  hCG, serum, qualitative  Result Value Ref Range   Preg, Serum NEGATIVE NEGATIVE  Acetaminophen level  Result Value Ref Range   Acetaminophen (Tylenol), Serum <10 (L) 10 - 30 ug/mL  Acetaminophen level  Result Value Ref Range   Acetaminophen (Tylenol), Serum <10 (L) 10 - 30 ug/mL  CBG monitoring, ED  Result Value Ref Range   Glucose-Capillary 196 (H) 70 - 99 mg/dL   DG Chest Port 1 View  Result Date: 11/29/2022 CLINICAL DATA:  Shortness of breath. EXAM: PORTABLE CHEST 1 VIEW COMPARISON:  07/09/2022 FINDINGS: The cardiomediastinal contours are normal. The lungs are clear. Pulmonary vasculature is normal. No consolidation, pleural effusion, or pneumothorax. No acute osseous abnormalities are seen. IMPRESSION: No acute  findings. Electronically Signed   By: Narda Rutherford M.D.   On: 11/29/2022 23:24     EKG EKG Interpretation Date/Time:  Sunday December 06 2022 03:05:40 EDT Ventricular Rate:  90 PR Interval:  170 QRS Duration:  96 QT Interval:  376 QTC Calculation: 461 R Axis:   47  Text Interpretation: Sinus rhythm Probable left ventricular hypertrophy Artifact in lead(s) I II aVR aVL aVF Confirmed by Nicanor Alcon, Abeeha Twist ([id number]) on 12/06/2022 3:10:29 AM  Radiology No results found.  Procedures Procedures    Medications Ordered in ED Medications - No data to display  ED Course/ Medical Decision Making/ A&P                                 Medical Decision Making Patient who intentionally ingested 7 clomipramine to kill self  Amount and/or Complexity of Data Reviewed External Data Reviewed: notes.    Details: Previous notes reviewed  Labs: ordered.    Details: 2 negative tylenol levels 9per poison control) negative salicylate level, negative pregnancy test. Normal sodium 135, potassium low 3.2 normal creatinine .87. Normal LFTs,  UDS is negative.  Elevated white count 15.6, low hemoglobin 11.9, normal platelet count  ECG/medicine tests: ordered and independent interpretation performed. Decision-making details documented in ED Course.  Risk OTC drugs. Prescription drug management. Risk Details: Under IVC for trying to leave with intentional overdose.  Holding orders placed    Final Clinical Impression(s) / ED  Diagnoses Final diagnoses:  None   The patient has been placed in psychiatric observation due to the need to provide a safe environment for the patient while obtaining psychiatric consultation and evaluation, as well as ongoing medical and medication management to treat the patient's condition.  The patient has not been placed under full IVC at this time.  Rx / DC Orders ED Discharge Orders     None         Milo Schreier, MD 12/06/22 [phone]

## 2022-12-06 NOTE — ED Notes (Signed)
Nurse was able to place a 20g IV in lateral L AC, once nurse drew blood pt requested to have IV removed.

## 2022-12-06 NOTE — ED Notes (Signed)
Pill count  for Clomipramine Hydroch caps 25mg .. currently 77 pills in bottle. Rx/bottle original amount is 90. Pt stated she only ingested 7 pills

## 2022-12-06 NOTE — Discharge Instructions (Signed)
Discharge recommendations:  Patient is to take medications as prescribed. Please see information for follow-up appointment with psychiatry and therapy. Please follow up with your primary care provider for all medical related needs.   Therapy: We recommend that patient participate in individual therapy to address mental health concerns.  Medications: The patient or guardian is to contact a medical professional and/or outpatient provider to address any new side effects that develop. The patient or guardian should update outpatient providers of any new medications and/or medication changes.   Atypical antipsychotics: If you are prescribed an atypical antipsychotic, it is recommended that your height, weight, BMI, blood pressure, fasting lipid panel, and fasting blood sugar be monitored by your outpatient providers.  Safety:  The patient should abstain from use of illicit substances/drugs and abuse of any medications. If symptoms worsen or do not continue to improve or if the patient becomes actively suicidal or homicidal then it is recommended that the patient return to the closest hospital emergency department, the [facility], or call 911 for further evaluation and treatment. National Suicide Prevention Lifeline 1-800-SUICIDE or (706) 687-6315.  About 988 988 offers 24/7 access to trained crisis counselors who can help people experiencing mental health-related distress. People can call or text 988 or chat 988lifeline.org for themselves or if they are worried about a loved one who may need crisis support.  Crisis Mobile: [facility]:                     (425)398-8634 (for crisis response 24 hours a day) [facility] Hotline:                                            979-480-6770    Safety Plan [name] will reach out to her mother [name], call 911 or call mobile crisis, or go to nearest emergency room if condition worsens or if  suicidal thoughts become active Patients' will follow up with behavioral health urgent care for outpatient psychiatric services (therapy/medication management).  The suicide prevention education provided includes the following: Suicide risk factors Suicide prevention and interventions National Suicide Hotline telephone number [facility] assessment telephone number [facility] Emergency Assistance 911 [facility] and/or Residential Mobile Crisis Unit telephone number Request made of family/significant other to:   mother [name]  Remove weapons (e.g., guns, rifles, knives), all items previously/currently identified as safety concern.   Remove drugs/medications (over the counter, prescriptions, illicit drugs), all items previously/currently identified as a safety concern.

## 2022-12-06 NOTE — ED Notes (Signed)
Nurse contacted Poison control  and spoke to [place], [employer], pt will present with; Sedation/drowsy Tachycardia GI symptoms Risk for seizure  Hypotension QRS wide Watch 8hrs post ingestion on cardiac monitor Repeat EKG 4-5hrs If hypotensive give fluid then norepi If QRS is Greater than 120 give biocarb bolus Check for seizure activity Draw Acetaminophen lab at [facility]

## 2022-12-06 NOTE — ED Notes (Signed)
Patient wanded by security and belongings placed in locker.

## 2022-12-06 NOTE — ED Notes (Signed)
The CNA collected the belonging and secured it to the locker.

## 2022-12-06 NOTE — ED Notes (Signed)
Poisoned control, called this nurse and asking about the status of pt, vitals sign provided to Patty, and they said she's okay.

## 2022-12-06 NOTE — Progress Notes (Signed)
Pt will now be removed from the [facility] shift report due to pt being psych cleared by provider Alona Bene, PMHNP. TOC to assist if there is an identified need for discharge.  Kelton Pillar, LCSWA 12/06/2022 @ 12:14 PM

## 2022-12-06 NOTE — Consult Note (Signed)
BH ED ASSESSMENT   Reason for Consult: Psych Consult Referring Physician: Dr. Nicanor Alcon Patient Identification: Stacy [name] MRN:  [id number] ED Chief Complaint: Adjustment disorder with emotional disturbance  Diagnosis:  Principal Problem:   Adjustment disorder with emotional disturbance Active Problems:   Borderline intellectual functioning   Suicidal overdose [facility])   ED Assessment Time Calculation: Start Time: 0830 Stop Time: 0900 Total Time in Minutes (Assessment Completion): 30   Subjective:  Stacy [name] is a 32 y.o. female patient with a past psychiatric history of bipolar 1 disorder, generalized anxiety disorder, major depressive disorder, suicidal ideation, impulse control disorder, schizoaffective disorder, schizophrenia, disorganized, adjustment disorder with emotional disturbance, affective psychosis, borderline intellectual functioning, recurrent suicide attempts, and numerous inpatient admissions.     HPI:  Stacy [name], 32 y.o., female patient seen face to face by this provider, consulted with Dr. Lucianne Muss; and chart reviewed on 12/06/22.  On evaluation Stacy [name] reports that she is here because mom made her upset because she was not able to go to mom's house yesterday.  Patient mood appears to be bright she said that she talked with her mom, and mother said that she can come over to her house and play with the new puppy.  Patient [name] that she lives by herself but she lives close to her mother and she and her mother are very close. Patient says my mother is ready for me to come home and I am ready to go home and I don't want to go to an inpatient facility. Patient denies SI/HI/AVH. Patient denies using any illicit drugs or alcohol. She feels safe to go home. She says her sleep and appetite are good.  Patient also says she has an ACT team that she is very active with and she feels comfortable and safe with her ACT team, provider unable to reach ACT team.   On  assessment patient is observed sitting in her bed awake.  She is bright upon approach and smiling.  She immediately [name], "I feel great I am ready to go home".  She is alert/oriented x 4, cooperative, and attentive. She was able to sleep while on the unit and denies any concerns with appetite.  She is denying suicidal ideations.  She verbally contracts for safety.  Safety planning/intervention was completed.  Stacy [name] she is having utilize 988, 911, and how to reach her ACT team.  She denies access to firearms/weapons.  She is denying any SI/HI/AVH.  She does not appear manic or psychotic.  Objectively she does not appear to be responding to internal/external stimuli.  She is able to answer questions appropriately.  Spoke with patient's mother Stacy [name], she [name] patient gets upset easily when she cannot immediately get in contact with her.  Stacy [name] [name] that she spoke with her daughter this morning, she feels that she is safe to go home, but she will be coming over to her mother's house, she does not feel the patient is a danger to self or anyone else.  Stacy [name] [name] that they live in close proximity that she will go over to the patient's house or have patient come over to her house for the rest of the day.  This provider discussed with mother safety planning to have the patient return home today.  Stacy [name].    Stay Summary:  Patient remained calm and cooperative while on the unit.  She  was appropriate with staff and compliant with medications.  She required no as needed medications for agitation.  She agrees to follow-up with her ACT team today.  Patient was also informed to go [facility] [facility] crisis. Patient is Psychiatrically cleared.   Past Psychiatric History: ipolar 1 disorder, generalized anxiety disorder, major depressive disorder, suicidal ideation, impulse  control disorder, schizoaffective disorder, schizophrenia, disorganized, adjustment disorder with emotional disturbance, affective psychosis, borderline intellectual functioning, recurrent suicide attempts, and numerous inpatient admissions  Risk to Self or Others: Risk to Self:  No  Risk to Others:  No  Prior Inpatient Therapy:  Yes  Prior Outpatient Therapy: Yes, ACTT team     [place] Scale:  Flowsheet Row ED from 12/05/2022 in [facility] Emergency Department at [facility] ED from 11/29/2022 in [facility] Emergency Department at [facility] ED from 11/26/2022 in [facility] Emergency Department at [facility]  C-SSRS RISK CATEGORY High Risk No Risk No Risk       AIMS:  , , ,  ,   ASAM: ASAM Multidimensional Assessment Summary Dimension 1:  Description of individual's past and current experiences of substance use and withdrawal: NA DImension 1:  Acute Intoxication and/or Withdrawal Potential Severity Rating: None Dimension 2:  Description of patient's biomedical conditions and  complications: NA Dimension 2:  Biomedical Conditions and Complications Severity Rating: None Dimension 3:  Description of emotional, behavioral, or cognitive conditions and complications: NA Dimension 3:  Emotional, behavioral or cognitive (EBC) conditions and complications severity rating: None Dimension 4:  Description of Readiness to Change criteria: NA Dimension 4:  Readiness to Change Severity Rating: None Dimension 5:  Relapse, continued use, or continued problem potential critiera description: NA Dimension 5:  Relapse, continued use, or continued problem potential severity rating: None Dimension 6:  Recovery/Iiving environment criteria description: NA Dimension 6:  Recovery/living environment severity rating: None ASAM's Severity Rating Score: 0 ASAM Recommended Level of Treatment:  (n/a)  Substance Abuse:  Alcohol / Drug Use Pain Medications: See MAR Prescriptions: See  MAR Over the Counter: See MAR History of alcohol / drug use?: No history of alcohol / drug abuse Longest period of sobriety (when/how long): N/A Negative Consequences of Use:  (N/A) Withdrawal Symptoms:  (N/A)  Past Medical History:  Past Medical History:  Diagnosis Date   Anxiety    Bipolar 1 disorder (HCC)    Cognitive deficits    Depression    Diabetes mellitus without complication (HCC)    Hypertension    Mental disorder    Mental health disorder    Obesity     Past Surgical History:  Procedure Laterality Date   CESAREAN SECTION     CESAREAN SECTION N/A 04/25/2013   Procedure: REPEAT CESAREAN SECTION;  Surgeon: Catalina Antigua, MD;  Location: [facility];  Service: Obstetrics;  Laterality: N/A;   MASS EXCISION N/A 06/03/2012   Procedure: EXCISION MASS;  Surgeon: Osborn Coho, MD;  Location: [facility];  Service: ENT;  Laterality: N/A;  Excision uvula mass   TONSILLECTOMY N/A 06/03/2012   Procedure: TONSILLECTOMY;  Surgeon: Osborn Coho, MD;  Location: [facility];  Service: ENT;  Laterality: N/A;   TONSILLECTOMY     Family History:  Family History  Problem Relation Age of Onset   Hypertension Mother    Diabetes Father     Social History:  Social History   Substance and Sexual Activity  Alcohol Use Not  Currently     Social History   Substance and Sexual Activity  Drug Use Yes   Types: "Crack" cocaine, Other-see comments, Marijuana    Social History   Socioeconomic History   Marital status: Single    Spouse name: Not on file   Number of children: Not on file   Years of education: Not on file   Highest education level: Not on file  Occupational History   Not on file  Tobacco Use   Smoking status: Every Day    Current packs/day: 1.00    Types: Cigars, Cigarettes   Smokeless tobacco: Never   Tobacco comments:    Pt declined  Vaping Use   Vaping status: Never Used  Substance and Sexual Activity   Alcohol use: Not Currently    Drug use: Yes    Types: "Crack" cocaine, Other-see comments, Marijuana   Sexual activity: Not Currently    Birth control/protection: None    Comment: occasionally  Other Topics Concern   Not on file  Social History Narrative   ** Merged History Encounter **       Social Determinants of Health   Financial Resource Strain: Not on file  Food Insecurity: No Food Insecurity (04/10/2022)   Hunger Vital Sign    Worried About Running Out of Food in the Last Year: Never true    Ran Out of Food in the Last Year: Never true  Transportation Needs: No Transportation Needs (04/10/2022)   PRAPARE - Administrator, Civil Service (Medical): No    Lack of Transportation (Non-Medical): No  Physical Activity: Not on file  Stress: Not on file  Social Connections: Not on file      Allergies:   Allergies  Allergen Reactions   Wellbutrin [Bupropion] Shortness Of Breath   Omnipaque [Iohexol] Swelling and Other (See Comments)    Eye swelling   Penicillins Hives and Other (See Comments)    Has patient had a PCN reaction causing immediate rash, facial/tongue/throat swelling, SOB or lightheadedness with hypotension: Unknown Has patient had a PCN reaction causing severe rash involving mucus membranes or skin necrosis: Yes Has patient had a PCN reaction that required hospitalization Unknown Has patient had a PCN reaction occurring within the last 10 years: No If all of the above answers are "NO", then may proceed with Cephalosporin use.   Atarax [Hydroxyzine] Other (See Comments)    Causes hyperactivity, makes pt want to "fight"   Contrast Media [Iodinated Contrast Media] Other (See Comments)    Eyes swell up   Cymbalta [Duloxetine Hcl] Other (See Comments)    No appetite and makes the patient "act up"    Labs:  Results for orders placed or performed during the hospital encounter of 12/05/22 (from the past 48 hour(s))  Comprehensive metabolic panel     Status: Abnormal   Collection  Time: 12/05/22 11:48 PM  Result Value Ref Range   Sodium 135 135 - 145 mmol/L   Potassium 3.2 (L) 3.5 - 5.1 mmol/L   Chloride 102 98 - 111 mmol/L   CO2 24 22 - 32 mmol/L   Glucose, Bld 189 (H) 70 - 99 mg/dL    Comment: Glucose reference range applies only to samples taken after fasting for at least 8 hours.   BUN 13 6 - 20 mg/dL   Creatinine, Ser 4.09 0.44 - 1.00 mg/dL   Calcium 9.3 8.9 - 81.1 mg/dL   Total Protein 7.9 6.5 - 8.1 g/dL   Albumin 4.4  3.5 - 5.0 g/dL   AST 33 15 - 41 U/L   ALT 32 0 - 44 U/L   Alkaline Phosphatase 67 38 - 126 U/L   Total Bilirubin 0.6 0.3 - 1.2 mg/dL   GFR, Estimated >40 >34 mL/min    Comment: (NOTE) Calculated using the CKD-EPI Creatinine Equation (2021)    Anion gap 9 5 - 15    Comment: Performed at [facility], [address] [place], [place] [address]  CBC WITH DIFFERENTIAL     Status: Abnormal   Collection Time: 12/05/22 11:48 PM  Result Value Ref Range   WBC 15.6 (H) 4.0 - 10.5 K/uL   RBC 4.86 3.87 - 5.11 MIL/uL   Hemoglobin 11.9 (L) 12.0 - 15.0 g/dL   HCT 56.3 87.5 - 64.3 %   MCV 78.2 (L) 80.0 - 100.0 fL   MCH 24.5 (L) 26.0 - 34.0 pg   MCHC 31.3 30.0 - 36.0 g/dL   RDW 32.9 (H) 51.8 - 84.1 %   Platelets 286 150 - 400 K/uL   nRBC 0.0 0.0 - 0.2 %   Neutrophils Relative % 56 %   Neutro Abs 8.8 (H) 1.7 - 7.7 K/uL   Lymphocytes Relative 34 %   Lymphs Abs 5.3 (H) 0.7 - 4.0 K/uL   Monocytes Relative 6 %   Monocytes Absolute 0.9 0.1 - 1.0 K/uL   Eosinophils Relative 3 %   Eosinophils Absolute 0.5 0.0 - 0.5 K/uL   Basophils Relative 1 %   Basophils Absolute 0.1 0.0 - 0.1 K/uL   Immature Granulocytes 0 %   Abs Immature Granulocytes 0.04 0.00 - 0.07 K/uL    Comment: Performed at [facility], [address] [place], [place] [address]  hCG, serum, qualitative     Status: None   Collection Time: 12/05/22 11:48 PM  Result Value Ref Range   Preg, Serum NEGATIVE NEGATIVE    Comment:        THE SENSITIVITY  OF THIS METHODOLOGY IS >10 mIU/mL. Performed at [facility], [address], [place], [place] [address]   CBG monitoring, ED     Status: Abnormal   Collection Time: 12/06/22 12:08 AM  Result Value Ref Range   Glucose-Capillary 196 (H) 70 - 99 mg/dL    Comment: Glucose reference range applies only to samples taken after fasting for at least 8 hours.  Acetaminophen level     Status: Abnormal   Collection Time: 12/06/22 12:36 AM  Result Value Ref Range   Acetaminophen (Tylenol), Serum <10 (L) 10 - 30 ug/mL    Comment: (NOTE) Therapeutic concentrations vary significantly. A range of 10-30 ug/mL  may be an effective concentration for many patients. However, some  are best treated at concentrations outside of this range. Acetaminophen concentrations >150 ug/mL at 4 hours after ingestion  and >50 ug/mL at 12 hours after ingestion are often associated with  toxic reactions.  Performed at [facility], [address] [place], [place] [address]   Acetaminophen level     Status: Abnormal   Collection Time: 12/06/22  3:12 AM  Result Value Ref Range   Acetaminophen (Tylenol), Serum <10 (L) 10 - 30 ug/mL    Comment: (NOTE) Therapeutic concentrations vary significantly. A range of 10-30 ug/mL  may be an effective concentration for many patients. However, some  are best treated at concentrations outside of this range. Acetaminophen concentrations >150 ug/mL at 4 hours after ingestion  and >50  ug/mL at 12 hours after ingestion are often associated with  toxic reactions.  Performed at [facility], [address] [place], [place] [address]   Urine rapid drug screen (hosp performed)     Status: None   Collection Time: 12/06/22  3:35 AM  Result Value Ref Range   Opiates NONE DETECTED NONE DETECTED   Cocaine NONE DETECTED NONE DETECTED   Benzodiazepines NONE DETECTED NONE DETECTED   Amphetamines NONE DETECTED NONE DETECTED    Tetrahydrocannabinol NONE DETECTED NONE DETECTED   Barbiturates NONE DETECTED NONE DETECTED    Comment: (NOTE) DRUG SCREEN FOR MEDICAL PURPOSES ONLY.  IF CONFIRMATION IS NEEDED FOR ANY PURPOSE, NOTIFY LAB WITHIN 5 DAYS.  LOWEST DETECTABLE LIMITS FOR URINE DRUG SCREEN Drug Class                     Cutoff (ng/mL) Amphetamine and metabolites    1000 Barbiturate and metabolites    200 Benzodiazepine                 200 Opiates and metabolites        300 Cocaine and metabolites        300 THC                            50 Performed at [facility], [address] [place], [place] [address]     Current Facility-Administered Medications  Medication Dose Route Frequency Provider Last Rate Last Admin   acetaminophen (TYLENOL) tablet 650 mg  650 mg Oral Q4H PRN Palumbo, April, MD       alum & mag hydroxide-simeth (MAALOX/MYLANTA) 200-200-20 MG/5ML suspension 30 mL  30 mL Oral Q6H PRN Palumbo, April, MD       ondansetron [facility]) tablet 4 mg  4 mg Oral Q8H PRN Palumbo, April, MD       Current Outpatient Medications  Medication Sig Dispense Refill   amLODipine (NORVASC) 10 MG tablet Take 10 mg by mouth daily.     benztropine (COGENTIN) 0.5 MG tablet Take 1 tablet (0.5 mg total) by mouth 2 (two) times daily. For eps 60 tablet 0   Cholecalciferol (VITAMIN D3) 25 MCG (1000 UT) CAPS Take 1,000 Units by mouth daily.     clomiPRAMINE (ANAFRANIL) 25 MG capsule Take 25 mg by mouth at bedtime. Take 1 capsule at bedtime for 3 days then 2 capsules for 3 days then 3 capsules at bedtime     diclofenac Sodium (VOLTAREN) 1 % GEL Apply 2 g topically 4 (four) times daily. 50 g 0   gabapentin (NEURONTIN) 100 MG capsule Take 1 capsule (100 mg total) by mouth 3 (three) times daily. For agitation 90 capsule 0   haloperidol (HALDOL) 10 MG tablet Take 1 tablet (10 mg total) by mouth at bedtime.     insulin aspart protamine- aspart (NOVOLOG MIX 70/30) (70-30) 100 UNIT/ML injection Inject 20  Units into the skin 2 (two) times daily with a meal.     lithium carbonate (ESKALITH) 450 MG ER tablet Take 1 tablet (450 mg total) by mouth at bedtime. For mood stabilization 30 tablet 0   metoprolol tartrate (LOPRESSOR) 25 MG tablet Take 25 mg by mouth 2 (two) times daily.     ondansetron (ZOFRAN-ODT) 4 MG disintegrating tablet Take 1 tablet (4 mg total) by mouth every 8 (eight) hours as needed for nausea or vomiting. 10 tablet 0   simvastatin (  ZOCOR) 40 MG tablet Take 40 mg by mouth at bedtime.     traZODone (DESYREL) 50 MG tablet Take 1 tablet (50 mg total) by mouth at bedtime as needed for sleep. (Patient not taking: Reported on 07/06/2022) 30 tablet 0   venlafaxine XR (EFFEXOR-XR) 150 MG 24 hr capsule Take 150 mg by mouth daily with breakfast.      Musculoskeletal: Strength & Muscle Tone: within normal limits Gait & Station: normal Patient leans: N/A   Psychiatric Specialty Exam: Presentation  General Appearance:  Appropriate for Environment  Eye Contact: Good  Speech: Clear and Coherent  Speech Volume: Normal  Handedness: Right   Mood and Affect  Mood: Euthymic  Affect: Congruent   Thought Process  Thought Processes: Coherent  Descriptions of Associations:Intact  Orientation:Full (Time, Place and Person)  Thought Content:Logical; WDL  History of Schizophrenia/Schizoaffective disorder:Yes  Duration of Psychotic Symptoms:N/A  Hallucinations:Hallucinations: None  Ideas of Reference:None  Suicidal Thoughts:Suicidal Thoughts: No  Homicidal Thoughts:Homicidal Thoughts: No   Sensorium  Memory: Immediate Fair  Judgment: Fair  Insight: Fair   Executive Functions  Concentration: Good  Attention Span: Good  Recall: Good  Fund of Knowledge: Good  Language: Good   Psychomotor Activity  Psychomotor Activity: Psychomotor Activity: Normal   Assets  Assets: Communication Skills; Desire for Improvement; Social Support;  Housing    Sleep  Sleep: Sleep: Fair   Physical Exam: Physical Exam Vitals and nursing note reviewed. Exam conducted with a chaperone present.  Neurological:     Mental Status: She is alert.  Psychiatric:        Attention and Perception: Attention normal.        Mood and Affect: Mood normal.        Speech: Speech normal.        Behavior: Behavior is cooperative.        Thought Content: Thought content normal.        Cognition and Memory: Memory normal.        Judgment: Judgment is impulsive.    Review of Systems  Constitutional: Negative.   Psychiatric/Behavioral: Negative.     Blood pressure 130/78, pulse 92, temperature 97.8 F (36.6 C), temperature source Oral, resp. rate (!) 23, height 5\' 7"  (1.702 m), weight 116.6 kg, SpO2 97%. Body mass index is 40.25 kg/m.   Medical Decision Making: Patient is psychiatrically cleared. Patient case review and discussed with Dr. Clovis Riley, and patient does not meet inpatient criteria for inpatient psychiatric treatment. At time of discharge, patient denies SI, HI, AVH and can contract for safety. She demonstrated no overt evidence of psychosis or mania. Prior to discharge, she verbalized that they understood warning signs, triggers, and symptoms of worsening mental health and how to access emergency mental health [name] if they felt it was needed. Patient was instructed to call 911 or return to the emergency room if they experienced any concerning symptoms after discharge. Patient voiced understanding and agreed to the above.  Patient given resources to follow up with behavioral health urgent [name] for therapy and medication management. Patient denies access to weapons. Safety planning completed.  No medication changes made at, patient will be seeing her ACT team this week.  Safety Plan Christalyn Kallia Sorto will reach out to her mother Stacy [name], call 911 or call mobile crisis, or go to nearest emergency room if condition worsens or if suicidal  thoughts become active Patients' will follow up with behavioral health urgent [name] for outpatient psychiatric services (therapy/medication management).  The  suicide prevention education provided includes the following: Suicide risk factors Suicide prevention and interventions National Suicide Hotline telephone number [facility] assessment telephone number [facility] Emergency Assistance 911 [facility] and/or Residential Mobile Crisis Unit telephone number Request made of family/significant other to:   mother Stacy [name]  Remove weapons (e.g., guns, rifles, knives), all items previously/currently identified as safety concern.   Remove drugs/medications (over the counter, prescriptions, illicit drugs), all items previously/currently identified as a safety concern.      Disposition: Patient does not meet criteria for psychiatric inpatient admission. Supportive therapy provided about ongoing stressors. Discussed crisis plan, support from social network, calling 911, coming to the Emergency Department, and calling Suicide Hotline.  Leylah Tarnow MOTLEY-MANGRUM, PMHNP 12/06/2022 11:28 AM

## 2022-12-16 ENCOUNTER — Ambulatory Visit (HOSPITAL_COMMUNITY)
Admission: EM | Admit: 2022-12-16 | Discharge: 2022-12-16 | Disposition: A | Discharge status: Home / Self Care | Payer: 59 | Attending: Psychiatry | Admitting: Psychiatry

## 2022-12-16 DIAGNOSIS — Z760 Encounter for issue of repeat prescription: Secondary | ICD-10-CM | POA: Diagnosis not present

## 2022-12-16 DIAGNOSIS — R4589 Other symptoms and signs involving emotional state: Secondary | ICD-10-CM | POA: Diagnosis not present

## 2022-12-16 MED ORDER — GABAPENTIN 300 MG PO CAPS
300.0000 mg | ORAL_CAPSULE | Freq: Once | ORAL | Status: AC
  Administered 2022-12-16: 300 mg via ORAL
  Filled 2022-12-16: qty 1

## 2022-12-16 NOTE — ED Provider Notes (Signed)
Behavioral Health Urgent Care Medical Screening Exam  Patient Name: [name] MRN: [id number] Date of Evaluation: 12/16/22 Chief Complaint:   Diagnosis:  Final diagnoses:  Medication refill  Anxious appearance    History of Present illness: [name] is a 32 y.o. female. With a history of panic attacks, suicidal ideation, psychosis unspecified, malingering, bipolar disorder, anxiety.  Presented to [facility] via [employer].  Per the patient she did call the police because she just anxious changing her medicine change.  Not working according to the patient she wants to go BuSpar and she wants to increase her gabapentin.  Patient is currently seeing an outpatient psychiatry, writer discussed with patient that she needs to reach out to her outpatient psychiatry to have him increase her medication and/or change her medication regimen.  According to the patient she is followed by an outpatient and they were supposed to bring her medicine but they did not bring.   Face-to-face observation of patient, patient is alert and oriented x 4, speech is clear, maintaining eye contact.  Very cooperative and answers questions appropriately.  Patient denied SI, HI, AVH or paranoia at this time.  Denies alcohol use.  Reports she smokes cigarettes but denies any illicit drug use at this time.  Patient does not seem to be influenced by external or internal stimuli.  According to patient she just need her medication increase and also change to BuSpar.  Because patient is followed by outpatient psychiatry patient will be referred to her outpatient provider to increase and/or change her mood.  Will give patient a dose of hydroxyzine and a one-time increased dose of her gabapentin.  Patient is advised to call 911 or go to the nearest emergency should she experience any thoughts of suicide ideation or hallucination.  Patient understand and verbalized understanding of the instructions. At the time of this assessment patient  is both mentally and medically stable for discharge.  Recommend discharge the patient to follow-up with her outpatient therapist provider for medication management.  Flowsheet Row ED from 12/16/2022 in [facility] ED from 12/05/2022 in [facility] Emergency Department at [facility] ED from 11/29/2022 in [facility] Emergency Department at [facility]  C-SSRS RISK CATEGORY Low Risk High Risk No Risk       Psychiatric Specialty Exam  Presentation  General Appearance:Casual  Eye Contact:Good  Speech:Clear and Coherent  Speech Volume:Normal  Handedness:Right   Mood and Affect  Mood: Anxious  Affect: Congruent   Thought Process  Thought Processes: Coherent  Descriptions of Associations:Circumstantial  Orientation:Full (Time, Place and Person)  Thought Content:WDL  Diagnosis of Schizophrenia or Schizoaffective disorder in past: Yes  Duration of Psychotic Symptoms: Greater than six months  Hallucinations:None hearing 'women chattering." seeing demon and witches  Ideas of Reference:None  Suicidal Thoughts:No Without Intent; Without Plan With Intent  Homicidal Thoughts:No Without Intent; Without Plan; Without Means to Carry Out   Sensorium  Memory: Immediate Fair  Judgment: Fair  Insight: Fair   Art therapist  Concentration: Fair  Attention Span: Fair  Recall: Jennelle Human of Knowledge: Fair  Language: Fair   Psychomotor Activity  Psychomotor Activity: Normal No   Assets  Assets: Desire for Improvement; Resilience   Sleep  Sleep: Fair  Number of hours:  4   Physical Exam: Physical Exam HENT:     Head: Normocephalic.     Nose: Nose normal.  Cardiovascular:     Rate and Rhythm: Normal rate.  Pulmonary:  Effort: Pulmonary effort is normal.  Musculoskeletal:        General: Normal range of motion.     Cervical back: Normal range of motion.  Neurological:      General: No focal deficit present.     Mental Status: She is alert.  Psychiatric:        Mood and Affect: Mood normal.        Behavior: Behavior normal.    Review of Systems  Constitutional: Negative.   HENT: Negative.    Eyes: Negative.   Respiratory: Negative.    Cardiovascular: Negative.   Gastrointestinal: Negative.   Genitourinary: Negative.   Musculoskeletal: Negative.   Skin: Negative.   Neurological: Negative.   Psychiatric/Behavioral:  The patient is nervous/anxious.    Blood pressure 132/74, pulse 92, temperature 99.1 F (37.3 C), temperature source Oral, resp. rate 18, SpO2 100%. There is no height or weight on file to calculate BMI.  Musculoskeletal: Strength & Muscle Tone: within normal limits Gait & Station: normal Patient leans: N/A   [facility] MSE Discharge Disposition for Follow up and Recommendations: Based on my evaluation the patient does not appear to have an emergency medical condition and can be discharged with resources and follow up care in outpatient services for Medication Management   Sindy Guadeloupe, NP 12/16/2022, 6:04 AM

## 2022-12-16 NOTE — Progress Notes (Signed)
   12/16/22 0104  [facility] Triage Screening (Walk-ins at [facility] only)  How Did You Hear About [place]? Self  What Is the Reason for Your Visit/Call Today? Patient is a 32 year old female presenting voluntary to [facility] due to anxiety. Patient denies SI, HI, psychosis and alcohol/drug usage. Patient states "I just need buspar". Patient reports being "pissed of today and everyday". Patient reports psych medication compliance. Patient is currently being seen by ACT Team at [facility]. Patient reports she only wants something for anxiety.  How Long Has This Been Causing You Problems? <Week  Have You Recently Had Any Thoughts About Hurting Yourself? No  How long ago did you have thoughts about hurting yourself? n/a  Are You Planning to Commit Suicide/Harm Yourself At This time? No  Have you Recently Had Thoughts About Hurting Someone [name]? No  Are You Planning To Harm Someone At This Time? No  Explanation: n/a  Are you currently experiencing any auditory, visual or other hallucinations? No  Please explain the hallucinations you are currently experiencing: n/a  Have You Used Any Alcohol or Drugs in the Past 24 Hours? No  What Did You Use and How Much? n/a  Do you have any current medical co-morbidities that require immediate attention? No  Clinician description of patient physical appearance/behavior: neat / cooperative  What Do You Feel Would Help You the Most Today? Treatment for Depression or other mood problem  If access to [facility] was not available, would you have sought care in the Emergency Department? Yes  Determination of Need Routine (7 days)  Options For Referral Other: Comment;Medication Management;Outpatient Therapy (ACT Team / [facility])    Flowsheet Row ED from 12/16/2022 in [facility] ED from 12/05/2022 in [facility] Emergency Department at [facility] ED from 11/29/2022 in [facility] Emergency Department at [facility]  C-SSRS RISK CATEGORY Low Risk High Risk No Risk

## 2022-12-16 NOTE — ED Notes (Addendum)
[employer], [place] [employer] requested. Taxi Voucher approved by [name] Wyvonnia Dusky.

## 2022-12-18 ENCOUNTER — Ambulatory Visit (HOSPITAL_COMMUNITY)
Admission: EM | Admit: 2022-12-18 | Discharge: 2022-12-18 | Disposition: A | Discharge status: Home / Self Care | Payer: 59 | Attending: Psychiatry | Admitting: Psychiatry

## 2022-12-18 NOTE — ED Provider Notes (Signed)
Patient left encounter prior to completing evaluation. Pt did deny SI/HI and stated that [facility] PD brought her here due to persons downtown observed her crying and called the police. She denies SI and HI. Seen here 2 days ago and confirmed that she was able to pick-up medications prescribed and will take when she gets home. Patient is here voluntarily and requested to leave prior to completing evaluation.   Joaquin Courts, NP-C

## 2022-12-18 NOTE — Progress Notes (Signed)
   12/18/22 1339  [facility] Triage Screening (Walk-ins at [facility] only)  What Is the Reason for Your Visit/Call Today? Pt arrived to [facility] voluntarily via [employer]. Per [employer], pt was seen walking down [employer] and a random call was made to them. Pt informed [employer] that she needs to be brought to the facility. Pt states that she is ready to get out of [place]. Pt states that she can't renew her lease and she is tired of living alone. Pt states she would like to be in a group home in [place] or [place]. Pt denies SI/HI and AVH at this current time. Pt denies the use of alcohol or drugs at the present time.  Pt states that she has an ACTT.  How Long Has This Been Causing You Problems? > than 6 months  Have You Recently Had Any Thoughts About Hurting Yourself? No  Are You Planning to Commit Suicide/Harm Yourself At This time? No  Have you Recently Had Thoughts About Hurting Someone [name]? No  Are You Planning To Harm Someone At This Time? No  Are you currently experiencing any auditory, visual or other hallucinations? No  Have You Used Any Alcohol or Drugs in the Past 24 Hours? No  Do you have any current medical co-morbidities that require immediate attention? No  Clinician description of patient physical appearance/behavior: loud, cooperative and pacing  What Do You Feel Would Help You the Most Today? Housing Assistance;Social Support  If access to [facility] was not available, would you have sought care in the Emergency Department? No  Determination of Need Routine (7 days)  Options For Referral Outpatient Therapy

## 2022-12-19 ENCOUNTER — Emergency Department (HOSPITAL_COMMUNITY)
Admission: EM | Admit: 2022-12-19 | Discharge: 2022-12-19 | Disposition: A | Discharge status: Home / Self Care | Payer: 59 | Attending: Emergency Medicine | Admitting: Emergency Medicine

## 2022-12-19 ENCOUNTER — Other Ambulatory Visit: Payer: Self-pay

## 2022-12-19 DIAGNOSIS — R000 Tachycardia, unspecified: Secondary | ICD-10-CM | POA: Diagnosis not present

## 2022-12-19 DIAGNOSIS — J069 Acute upper respiratory infection, unspecified: Secondary | ICD-10-CM | POA: Insufficient documentation

## 2022-12-19 DIAGNOSIS — R059 Cough, unspecified: Secondary | ICD-10-CM | POA: Diagnosis not present

## 2022-12-19 DIAGNOSIS — R58 Hemorrhage, not elsewhere classified: Secondary | ICD-10-CM | POA: Diagnosis not present

## 2022-12-19 DIAGNOSIS — B9789 Other viral agents as the cause of diseases classified elsewhere: Secondary | ICD-10-CM | POA: Diagnosis not present

## 2022-12-19 DIAGNOSIS — I10 Essential (primary) hypertension: Secondary | ICD-10-CM | POA: Diagnosis not present

## 2022-12-19 DIAGNOSIS — R070 Pain in throat: Secondary | ICD-10-CM | POA: Diagnosis not present

## 2022-12-19 MED ORDER — BENZONATATE 100 MG PO CAPS: 100.0000 mg | ORAL_CAPSULE | Freq: Three times a day (TID) | ORAL | 0 refills | Status: DC

## 2022-12-19 MED ORDER — ONDANSETRON 4 MG PO TBDP: ORAL_TABLET | ORAL | 0 refills | Status: DC

## 2022-12-19 NOTE — Discharge Instructions (Signed)
Take tylenol 2 pills 4 times a day and motrin 4 pills 3 times a day.  Drink plenty of fluids.  Return for worsening shortness of breath, headache, confusion. Follow up with your family doctor.   

## 2022-12-19 NOTE — ED Triage Notes (Signed)
Pt BIBA from home. C/o sinus congestion, N/V, and chills for 4x days.  Pt has been unable to take BP meds.  AOx4

## 2022-12-19 NOTE — ED Provider Notes (Signed)
[place] EMERGENCY DEPARTMENT AT [facility] Provider Note   CSN: [id number] Arrival date & time: 12/19/22  1014     History  Chief Complaint  Patient presents with   URI   Nausea    [name] is a 32 y.o. female.  32 yo F with a complaints of cough and congestion.  Has been going on for a couple days.  Her boyfriend has something similar.  She had an episode of vomiting this morning.  Has been able to tolerate by mouth but does not feel like eating and drinking as much as she normally does.  No fevers.  No difficulty breathing.   URI      Home Medications Prior to Admission medications   Medication Sig Start Date End Date Taking? Authorizing Provider  benzonatate (TESSALON) 100 MG capsule Take 1 capsule (100 mg total) by mouth every 8 (eight) hours. 12/19/22  Yes Melene Plan, DO  ondansetron (ZOFRAN-ODT) 4 MG disintegrating tablet 4mg  ODT q4 hours prn nausea/vomit 12/19/22  Yes Melene Plan, DO  amLODipine (NORVASC) 10 MG tablet Take 10 mg by mouth daily.    [provider]  benztropine (COGENTIN) 0.5 MG tablet Take 1 tablet (0.5 mg total) by mouth 2 (two) times daily. For eps 04/14/22   Armandina Stammer I, NP  Cholecalciferol (VITAMIN D3) 25 MCG (1000 UT) CAPS Take 1,000 Units by mouth daily.    [provider]  clomiPRAMINE (ANAFRANIL) 25 MG capsule Take 25 mg by mouth at bedtime. Take 1 capsule at bedtime for 3 days then 2 capsules for 3 days then 3 capsules at bedtime    [provider]  diclofenac Sodium (VOLTAREN) 1 % GEL Apply 2 g topically 4 (four) times daily. 06/07/22   Antony Madura, PA-C  gabapentin (NEURONTIN) 100 MG capsule Take 1 capsule (100 mg total) by mouth 3 (three) times daily. For agitation 04/14/22   Armandina Stammer I, NP  haloperidol (HALDOL) 10 MG tablet Take 1 tablet (10 mg total) by mouth at bedtime. 09/29/22   Ardis Hughs, NP  insulin aspart protamine- aspart (NOVOLOG MIX 70/30) (70-30) 100 UNIT/ML injection  Inject 20 Units into the skin 2 (two) times daily with a meal.    [provider]  lithium carbonate (ESKALITH) 450 MG ER tablet Take 1 tablet (450 mg total) by mouth at bedtime. For mood stabilization 04/14/22   Armandina Stammer I, NP  metoprolol tartrate (LOPRESSOR) 25 MG tablet Take 25 mg by mouth 2 (two) times daily.    [provider]  simvastatin (ZOCOR) 40 MG tablet Take 40 mg by mouth at bedtime.    [provider]  traZODone (DESYREL) 50 MG tablet Take 1 tablet (50 mg total) by mouth at bedtime as needed for sleep. Patient not taking: Reported on 07/06/2022 04/14/22   Armandina Stammer I, NP  venlafaxine XR (EFFEXOR-XR) 150 MG 24 hr capsule Take 150 mg by mouth daily with breakfast.    [provider]      Allergies    Wellbutrin [bupropion], Omnipaque [iohexol], Penicillins, Atarax [hydroxyzine], Contrast media [iodinated contrast media], and Cymbalta [duloxetine hcl]    Review of Systems   Review of Systems  Physical Exam Updated Vital Signs BP 138/86   Pulse 97   Temp 98.2 F (36.8 C)   Resp 18   Ht 5\' 7"  (1.702 m)   Wt 116 kg   SpO2 100%   BMI 40.05 kg/m  Physical Exam Vitals and nursing  note reviewed.  Constitutional:      General: She is not in acute distress.    Appearance: She is well-developed. She is not diaphoretic.  HENT:     Head: Normocephalic and atraumatic.     Comments: Swollen turbinates, posterior nasal drip,  tm normal bilaterally.   Eyes:     Pupils: Pupils are equal, round, and reactive to light.  Cardiovascular:     Rate and Rhythm: Normal rate and regular rhythm.     Heart sounds: No murmur heard.    No friction rub. No gallop.  Pulmonary:     Effort: Pulmonary effort is normal.     Breath sounds: No wheezing or rales.  Abdominal:     General: There is no distension.     Palpations: Abdomen is soft.     Tenderness: There is no abdominal tenderness.  Musculoskeletal:        General: No tenderness.      Cervical back: Normal range of motion and neck supple.  Skin:    General: Skin is warm and dry.  Neurological:     Mental Status: She is alert and oriented to person, place, and time.  Psychiatric:        Behavior: Behavior normal.     ED Results / Procedures / Treatments   Labs (all labs ordered are listed, but only abnormal results are displayed) Labs Reviewed - No data to display  EKG None  Radiology No results found.  Procedures Procedures    Medications Ordered in ED Medications - No data to display  ED Course/ Medical Decision Making/ A&P                                 Medical Decision Making Risk Prescription drug management.   32 yo F with a chief complaints of cough and congestion.  Has been going on for a couple days.  Her boyfriend is sick with something similar.  She is well-appearing and nontoxic.  Clear lung sounds for me.  No bacterial source was found on exam.  I reviewed the patient's medical record and she is well-known to this emergency department with 16 visits in the past 6 months and a care plan.  Do not feel she would benefit from any acute imaging or laboratory evaluation.  Will treat supportively.  PCP follow-up.  10:34 AM:  I have discussed the diagnosis/risks/treatment options with the patient.  Evaluation and diagnostic testing in the emergency department does not suggest an emergent condition requiring admission or immediate intervention beyond what has been performed at this time.  They will follow up with PCP. We also discussed returning to the ED immediately if new or worsening sx occur. We discussed the sx which are most concerning (e.g., sudden worsening pain, fever, inability to tolerate by mouth) that necessitate immediate return. Medications administered to the patient during their visit and any new prescriptions provided to the patient are listed below.  Medications given during this visit Medications - No data to display   The  patient appears reasonably screen and/or stabilized for discharge and I doubt any other medical condition or other [facility] requiring further screening, evaluation, or treatment in the ED at this time prior to discharge.         Final Clinical Impression(s) / ED Diagnoses Final diagnoses:  Viral URI with cough    Rx / DC Orders ED Discharge Orders  Ordered    benzonatate (TESSALON) 100 MG capsule  Every 8 hours        12/19/22 1030    ondansetron (ZOFRAN-ODT) 4 MG disintegrating tablet        12/19/22 1030              [place], DO 12/19/22 1035

## 2023-01-02 ENCOUNTER — Ambulatory Visit (HOSPITAL_COMMUNITY)
Admission: EM | Admit: 2023-01-02 | Discharge: 2023-01-03 | Disposition: A | Discharge status: Home / Self Care | Payer: 59 | Attending: Family | Admitting: Family

## 2023-01-02 DIAGNOSIS — R45851 Suicidal ideations: Secondary | ICD-10-CM | POA: Diagnosis not present

## 2023-01-02 MED ORDER — LORAZEPAM 2 MG/ML IJ SOLN: 2.0000 mg | Freq: Once | INTRAMUSCULAR | Status: AC

## 2023-01-02 MED ORDER — INSULIN ASPART PROT & ASPART (70-30 MIX) 100 UNIT/ML ~~LOC~~ SUSP
20.0000 [IU] | Freq: Two times a day (BID) | SUBCUTANEOUS | Status: DC
  Administered 2023-01-02 – 2023-01-03 (×2): 20 [IU] via SUBCUTANEOUS

## 2023-01-02 MED ORDER — ZIPRASIDONE MESYLATE 20 MG IM SOLR
20.0000 mg | Freq: Once | INTRAMUSCULAR | Status: AC
  Administered 2023-01-02: 20 mg via INTRAMUSCULAR

## 2023-01-02 MED ORDER — ALUM & MAG HYDROXIDE-SIMETH 200-200-20 MG/5ML PO SUSP: 30.0000 mL | ORAL | Status: DC | PRN

## 2023-01-02 MED ORDER — LORAZEPAM 1 MG PO TABS: 2.0000 mg | ORAL_TABLET | Freq: Once | ORAL | Status: DC

## 2023-01-02 MED ORDER — MAGNESIUM HYDROXIDE 400 MG/5ML PO SUSP: 30.0000 mL | Freq: Every day | ORAL | Status: DC | PRN

## 2023-01-02 MED ORDER — HALOPERIDOL 5 MG PO TABS
10.0000 mg | ORAL_TABLET | Freq: Every day | ORAL | Status: DC
  Administered 2023-01-02: 10 mg via ORAL
  Filled 2023-01-02: qty 2

## 2023-01-02 MED ORDER — BUSPIRONE HCL 10 MG PO TABS
10.0000 mg | ORAL_TABLET | Freq: Three times a day (TID) | ORAL | Status: DC
  Administered 2023-01-02 – 2023-01-03 (×3): 10 mg via ORAL
  Filled 2023-01-02 (×3): qty 1

## 2023-01-02 MED ORDER — LITHIUM CARBONATE ER 450 MG PO TBCR
450.0000 mg | EXTENDED_RELEASE_TABLET | Freq: Every day | ORAL | Status: DC
  Administered 2023-01-02: 450 mg via ORAL
  Filled 2023-01-02: qty 1

## 2023-01-02 MED ORDER — SIMVASTATIN 20 MG PO TABS
40.0000 mg | ORAL_TABLET | Freq: Every day | ORAL | Status: DC
  Administered 2023-01-02: 40 mg via ORAL
  Filled 2023-01-02: qty 2

## 2023-01-02 MED ORDER — VENLAFAXINE HCL ER 150 MG PO CP24
150.0000 mg | ORAL_CAPSULE | Freq: Every day | ORAL | Status: DC
  Administered 2023-01-03: 150 mg via ORAL
  Filled 2023-01-02: qty 1

## 2023-01-02 MED ORDER — AMLODIPINE BESYLATE 10 MG PO TABS
10.0000 mg | ORAL_TABLET | Freq: Every day | ORAL | Status: DC
  Administered 2023-01-03: 10 mg via ORAL
  Filled 2023-01-02: qty 1

## 2023-01-02 MED ORDER — GABAPENTIN 100 MG PO CAPS
100.0000 mg | ORAL_CAPSULE | Freq: Three times a day (TID) | ORAL | Status: DC
  Administered 2023-01-02 – 2023-01-03 (×3): 100 mg via ORAL
  Filled 2023-01-02 (×3): qty 1

## 2023-01-02 MED ORDER — TRAZODONE HCL 50 MG PO TABS
50.0000 mg | ORAL_TABLET | Freq: Every day | ORAL | Status: DC
  Administered 2023-01-02: 50 mg via ORAL
  Filled 2023-01-02: qty 1

## 2023-01-02 MED ORDER — METOPROLOL TARTRATE 25 MG PO TABS
25.0000 mg | ORAL_TABLET | Freq: Two times a day (BID) | ORAL | Status: DC
  Administered 2023-01-02 – 2023-01-03 (×2): 25 mg via ORAL
  Filled 2023-01-02 (×2): qty 1

## 2023-01-02 MED ORDER — BENZTROPINE MESYLATE 0.5 MG PO TABS
0.5000 mg | ORAL_TABLET | Freq: Two times a day (BID) | ORAL | Status: DC
  Administered 2023-01-02 – 2023-01-03 (×2): 0.5 mg via ORAL
  Filled 2023-01-02 (×2): qty 1

## 2023-01-02 MED ORDER — LORAZEPAM 2 MG/ML IJ SOLN
INTRAMUSCULAR | Status: AC
  Administered 2023-01-02: 2 mg via INTRAMUSCULAR
  Filled 2023-01-02: qty 1

## 2023-01-02 MED ORDER — ACETAMINOPHEN 325 MG PO TABS: 650.0000 mg | ORAL_TABLET | Freq: Four times a day (QID) | ORAL | Status: DC | PRN

## 2023-01-02 NOTE — ED Provider Notes (Signed)
[facility] Continuous Assessment Admission H&P  Date: 01/02/23 Patient Name: [name] MRN: [id number] Chief Complaint: "I want to kill myself" Diagnoses:  Final diagnoses:  None    HPI: [name] 32 y.o., female patient is brought in by [facility] because patient was screaming and yelling outside a grocery store.  She is not IVC'd. She stated she wants to kill herself.    [name], 32 y.o., female patient seen face to face by this provider, consulted with Dr. Jannifer Franklin; and chart reviewed on 01/02/23.  On evaluation [name] reports that she has a plan to kill herself by burning her arms with cigarettes; she says "I can take the pain" and repeats this a few times.  She goes on a long, loud and pressured rant about how her boyfriend mistreats her and she can't get him out of her system.  She says her mother is mean to her and encourages her to "pray about it."  The patient states "People want me to loose my mind" but she is unable to articulate why she thinks people want that for her.  She shares that she "eats a lot mid morning" and "I'm fat because I drink a lot of sodas."  Patient also stated "I need another ACT team".  Patient lowered the volume of her voice when she received a comment about her hair and said "I like getting compliments on my hair."  Patient made it a point to stress she doesn't want her buspar changed because she believes that is why she is regrowing her hair.    Patient says she lives alone and finds her place a source of peace usually, but her boyfriend keeps coming over and insisting on coming in and she can't tell him no and then her home is no longer a place of peace.  She says she has 3 daughters and doesn't want any of them.  The patient took off her shoes and socks and threw her socks across the room.  The patient states that taking a walk and talking to people helps her calm down. These coping skills have not been effective for her today.   Patient is asking for "2 or 3 or more pills to help" and she is asking for inpatient admission "but only right here."  During evaluation [name] is sitting on the floor in the waiting room in acute distress. She is alert, oriented x 4, yelling and crying and making large gestures with her arms. Her mood is irritable, angry, labile with congruent affect.  She has normal speech that is loud and pressured; her behavior is inappropriate.  She denies homicidal ideation, psychosis, and paranoia. She endorses suicidal ideation and self-harm. Patient partially answered questions.    Patient is a danger to herself and meets criteria for inpatient psychiatric hospitalization.     Total Time spent with patient: 30 minutes  Musculoskeletal  Strength & Muscle Tone: within normal limits Gait & Station: normal Patient leans: N/A  Psychiatric Specialty Exam  Presentation General Appearance:  Bizarre  Eye Contact: Fair  Speech: Clear and Coherent  Speech Volume: Increased (Patient is very loud, yelling at staff.)  Handedness: Right   Mood and Affect  Mood: Irritable; Labile; Angry  Affect: Labile; Tearful   Thought Process  Thought Processes: Disorganized  Descriptions of Associations:Tangential  Orientation:Full (Time, Place and Person)  Thought Content:WDL  Diagnosis of Schizophrenia or Schizoaffective disorder in past: Yes  Duration of Psychotic Symptoms:  Greater than six months  Hallucinations:Hallucinations: None  Ideas of Reference:None  Suicidal Thoughts:Suicidal Thoughts: Yes, Active SI Active Intent and/or Plan: With Intent; With Plan  Homicidal Thoughts:Homicidal Thoughts: No   Sensorium  Memory: Immediate Fair; Recent Fair; Remote Fair  Judgment: Impaired  Insight: Lacking   Executive Functions  Concentration: Fair  Attention Span: Fair  Recall: [employer] of Knowledge: Fair  Language: Fair   Psychomotor Activity   Psychomotor Activity: Psychomotor Activity: Normal   Assets  Assets: Desire for Improvement; Leisure Time   Sleep  Sleep: Sleep: Fair   No data recorded  Physical Exam Constitutional:      Appearance: She is obese.  Eyes:     Pupils: Pupils are equal, round, and reactive to light.  Pulmonary:     Effort: Pulmonary effort is normal.  Skin:    General: Skin is dry.  Neurological:     Mental Status: She is alert and oriented to person, place, and time.    Review of Systems  Psychiatric/Behavioral:  Positive for suicidal ideas.        She is irritable, loud and aggressive  All other systems reviewed and are negative.   There were no vitals taken for this visit. There is no height or weight on file to calculate BMI.  Past Psychiatric History: multiple previous stays   Is the patient at risk to self? Yes  Has the patient been a risk to self in the past 6 months? Yes .    Has the patient been a risk to self within the distant past? Yes   Is the patient a risk to others? No   Has the patient been a risk to others in the past 6 months? No   Has the patient been a risk to others within the distant past? No   Past Medical History: diabetes, HTN, morbid obesity, GERD and migraines  Family History: None noted  Social History: Patient lives alone, she has 3 daughters that she does not see  Last Labs:  Admission on 12/05/2022, Discharged on 12/06/2022  Component Date Value Ref Range Status   Glucose-Capillary 12/06/2022 196 (H)  70 - 99 mg/dL Final   Glucose reference range applies only to samples taken after fasting for at least 8 hours.   Sodium 12/05/2022 135  135 - 145 mmol/L Final   Potassium 12/05/2022 3.2 (L)  3.5 - 5.1 mmol/L Final   Chloride 12/05/2022 102  98 - 111 mmol/L Final   CO2 12/05/2022 24  22 - 32 mmol/L Final   Glucose, Bld 12/05/2022 189 (H)  70 - 99 mg/dL Final   Glucose reference range applies only to samples taken after fasting for at least 8  hours.   BUN 12/05/2022 13  6 - 20 mg/dL Final   Creatinine, Ser 12/05/2022 0.87  0.44 - 1.00 mg/dL Final   Calcium 16/01/9603 9.3  8.9 - 10.3 mg/dL Final   Total Protein 54/12/8117 7.9  6.5 - 8.1 g/dL Final   Albumin 14/78/2956 4.4  3.5 - 5.0 g/dL Final   AST 21/30/8657 33  15 - 41 U/L Final   ALT 12/05/2022 32  0 - 44 U/L Final   Alkaline Phosphatase 12/05/2022 67  38 - 126 U/L Final   Total Bilirubin 12/05/2022 0.6  0.3 - 1.2 mg/dL Final   GFR, Estimated 12/05/2022 >60  >60 mL/min Final   Comment: (NOTE) Calculated using the CKD-EPI Creatinine Equation (2021)    Anion gap 12/05/2022 9  5 - 15 Final   Performed at [facility], [address] [place], [place] [address]   Opiates 12/06/2022 NONE DETECTED  NONE DETECTED Final   Cocaine 12/06/2022 NONE DETECTED  NONE DETECTED Final   Benzodiazepines 12/06/2022 NONE DETECTED  NONE DETECTED Final   Amphetamines 12/06/2022 NONE DETECTED  NONE DETECTED Final   Tetrahydrocannabinol 12/06/2022 NONE DETECTED  NONE DETECTED Final   Barbiturates 12/06/2022 NONE DETECTED  NONE DETECTED Final   Comment: (NOTE) DRUG SCREEN FOR MEDICAL PURPOSES ONLY.  IF CONFIRMATION IS NEEDED FOR ANY PURPOSE, NOTIFY LAB WITHIN 5 DAYS.  LOWEST DETECTABLE LIMITS FOR URINE DRUG SCREEN Drug Class                     Cutoff (ng/mL) Amphetamine and metabolites    1000 Barbiturate and metabolites    200 Benzodiazepine                 200 Opiates and metabolites        300 Cocaine and metabolites        300 THC                            50 Performed at [facility], [address], [place], [place] [address]    WBC 12/05/2022 15.6 (H)  4.0 - 10.5 K/uL Final   RBC 12/05/2022 4.86  3.87 - 5.11 MIL/uL Final   Hemoglobin 12/05/2022 11.9 (L)  12.0 - 15.0 g/dL Final   HCT 29/52/8413 38.0  36.0 - 46.0 % Final   MCV 12/05/2022 78.2 (L)  80.0 - 100.0 fL Final   MCH 12/05/2022 24.5 (L)  26.0 - 34.0 pg Final   MCHC 12/05/2022  31.3  30.0 - 36.0 g/dL Final   RDW 24/40/1027 16.0 (H)  11.5 - 15.5 % Final   Platelets 12/05/2022 286  150 - 400 K/uL Final   nRBC 12/05/2022 0.0  0.0 - 0.2 % Final   Neutrophils Relative % 12/05/2022 56  % Final   Neutro Abs 12/05/2022 8.8 (H)  1.7 - 7.7 K/uL Final   Lymphocytes Relative 12/05/2022 34  % Final   Lymphs Abs 12/05/2022 5.3 (H)  0.7 - 4.0 K/uL Final   Monocytes Relative 12/05/2022 6  % Final   Monocytes Absolute 12/05/2022 0.9  0.1 - 1.0 K/uL Final   Eosinophils Relative 12/05/2022 3  % Final   Eosinophils Absolute 12/05/2022 0.5  0.0 - 0.5 K/uL Final   Basophils Relative 12/05/2022 1  % Final   Basophils Absolute 12/05/2022 0.1  0.0 - 0.1 K/uL Final   Immature Granulocytes 12/05/2022 0  % Final   Abs Immature Granulocytes 12/05/2022 0.04  0.00 - 0.07 K/uL Final   Performed at [facility], [address], [place], [place] [address]   Preg, Serum 12/05/2022 NEGATIVE  NEGATIVE Final   Comment:        THE SENSITIVITY OF THIS METHODOLOGY IS >10 mIU/mL. Performed at [facility], [address] [place], [place] [address]    Acetaminophen (Tylenol), Serum 12/06/2022 <10 (L)  10 - 30 ug/mL Final   Comment: (NOTE) Therapeutic concentrations vary significantly. A range of 10-30 ug/mL  may be an effective concentration for many patients. However, some  are best treated at concentrations outside of this range. Acetaminophen concentrations >150 ug/mL at 4 hours after ingestion  and >50 ug/mL at 12 hours after ingestion are  often associated with  toxic reactions.  Performed at [facility], [address] [place], [place] [address]    Acetaminophen (Tylenol), Serum 12/06/2022 <10 (L)  10 - 30 ug/mL Final   Comment: (NOTE) Therapeutic concentrations vary significantly. A range of 10-30 ug/mL  may be an effective concentration for many patients. However, some  are best treated at concentrations outside of this  range. Acetaminophen concentrations >150 ug/mL at 4 hours after ingestion  and >50 ug/mL at 12 hours after ingestion are often associated with  toxic reactions.  Performed at [facility], [address], [place], [place] [address]   Admission on 11/01/2022, Discharged on 11/02/2022  Component Date Value Ref Range Status   Color, Urine 11/02/2022 STRAW (A)  YELLOW Final   APPearance 11/02/2022 CLEAR  CLEAR Final   Specific Gravity, Urine 11/02/2022 1.014  1.005 - 1.030 Final   pH 11/02/2022 5.0  5.0 - 8.0 Final   Glucose, UA 11/02/2022 >=500 (A)  NEGATIVE mg/dL Final   Hgb urine dipstick 11/02/2022 SMALL (A)  NEGATIVE Final   Bilirubin Urine 11/02/2022 NEGATIVE  NEGATIVE Final   Ketones, ur 11/02/2022 NEGATIVE  NEGATIVE mg/dL Final   Protein, ur 29/51/8841 NEGATIVE  NEGATIVE mg/dL Final   Nitrite 66/09/3014 NEGATIVE  NEGATIVE Final   Leukocytes,Ua 11/02/2022 NEGATIVE  NEGATIVE Final   RBC / HPF 11/02/2022 11-20  0 - 5 RBC/hpf Final   WBC, UA 11/02/2022 0-5  0 - 5 WBC/hpf Final   Bacteria, UA 11/02/2022 NONE SEEN  NONE SEEN Final   Squamous Epithelial / HPF 11/02/2022 0-5  0 - 5 /HPF Final   Mucus 11/02/2022 PRESENT   Final   Performed at [facility], [address] [place], [place] [address]   Preg Test, Ur 11/02/2022 NEGATIVE  NEGATIVE Final   Comment:        THE SENSITIVITY OF THIS METHODOLOGY IS >25 mIU/mL. Performed at [facility], [address], [place], [place] [address]    WBC 11/01/2022 10.7 (H)  4.0 - 10.5 K/uL Final   RBC 11/01/2022 4.07  3.87 - 5.11 MIL/uL Final   Hemoglobin 11/01/2022 9.7 (L)  12.0 - 15.0 g/dL Final   HCT 32/20/2542 31.8 (L)  36.0 - 46.0 % Final   MCV 11/01/2022 78.1 (L)  80.0 - 100.0 fL Final   MCH 11/01/2022 23.8 (L)  26.0 - 34.0 pg Final   MCHC 11/01/2022 30.5  30.0 - 36.0 g/dL Final   RDW 70/62/3762 16.3 (H)  11.5 - 15.5 % Final   Platelets 11/01/2022 219  150 - 400 K/uL Final    nRBC 11/01/2022 0.0  0.0 - 0.2 % Final   Neutrophils Relative % 11/01/2022 51  % Final   Neutro Abs 11/01/2022 5.5  1.7 - 7.7 K/uL Final   Lymphocytes Relative 11/01/2022 37  % Final   Lymphs Abs 11/01/2022 3.9  0.7 - 4.0 K/uL Final   Monocytes Relative 11/01/2022 6  % Final   Monocytes Absolute 11/01/2022 0.6  0.1 - 1.0 K/uL Final   Eosinophils Relative 11/01/2022 4  % Final   Eosinophils Absolute 11/01/2022 0.4  0.0 - 0.5 K/uL Final   Basophils Relative 11/01/2022 1  % Final   Basophils Absolute 11/01/2022 0.1  0.0 - 0.1 K/uL Final   Immature Granulocytes 11/01/2022 1  % Final   Abs Immature Granulocytes 11/01/2022 0.15 (H)  0.00 - 0.07 K/uL Final   Performed at [facility],  [address], [place], [place] [address]   Sodium 11/01/2022 136  135 - 145 mmol/L Final   Potassium 11/01/2022 3.2 (L)  3.5 - 5.1 mmol/L Final   Chloride 11/01/2022 105  98 - 111 mmol/L Final   CO2 11/01/2022 24  22 - 32 mmol/L Final   Glucose, Bld 11/01/2022 242 (H)  70 - 99 mg/dL Final   Glucose reference range applies only to samples taken after fasting for at least 8 hours.   BUN 11/01/2022 8  6 - 20 mg/dL Final   Creatinine, Ser 11/01/2022 0.93  0.44 - 1.00 mg/dL Final   Calcium 25/36/6440 8.6 (L)  8.9 - 10.3 mg/dL Final   Total Protein 34/74/2595 6.9  6.5 - 8.1 g/dL Final   Albumin 63/87/5643 3.6  3.5 - 5.0 g/dL Final   AST 32/95/1884 27  15 - 41 U/L Final   ALT 11/01/2022 27  0 - 44 U/L Final   Alkaline Phosphatase 11/01/2022 61  38 - 126 U/L Final   Total Bilirubin 11/01/2022 0.4  0.3 - 1.2 mg/dL Final   GFR, Estimated 11/01/2022 >60  >60 mL/min Final   Comment: (NOTE) Calculated using the CKD-EPI Creatinine Equation (2021)    Anion gap 11/01/2022 7  5 - 15 Final   Performed at [facility], [address] [place], [place] [address]   Lipase 11/01/2022 64 (H)  11 - 51 U/L Final   Performed at [facility], [address], [place],  [place] [address]   Neisseria Gonorrhea 11/01/2022 Negative   Final   Chlamydia 11/01/2022 Negative   Final   Comment 11/01/2022 Normal Reference Ranger Chlamydia - Negative   Final   Comment 11/01/2022 Normal Reference Range Neisseria Gonorrhea - Negative   Final   Yeast Wet Prep HPF POC 11/02/2022 NONE SEEN  NONE SEEN Final   Trich, Wet Prep 11/02/2022 NONE SEEN  NONE SEEN Final   Clue Cells Wet Prep HPF POC 11/02/2022 NONE SEEN  NONE SEEN Final   WBC, Wet Prep HPF POC 11/02/2022 <10  <10 Final   Swab received with less than 0.5 mL of saline, saline added to specimen, interpret results with caution.   Sperm 11/02/2022 NONE SEEN   Final   Performed at [facility], [address], [place], [place] [address]  Admission on 09/28/2022, Discharged on 09/29/2022  Component Date Value Ref Range Status   WBC 09/28/2022 9.7  4.0 - 10.5 K/uL Final   RBC 09/28/2022 4.27  3.87 - 5.11 MIL/uL Final   Hemoglobin 09/28/2022 10.1 (L)  12.0 - 15.0 g/dL Final   HCT 35/57/3220 32.4 (L)  36.0 - 46.0 % Final   MCV 09/28/2022 75.9 (L)  80.0 - 100.0 fL Final   MCH 09/28/2022 23.7 (L)  26.0 - 34.0 pg Final   MCHC 09/28/2022 31.2  30.0 - 36.0 g/dL Final   RDW 25/42/7062 17.0 (H)  11.5 - 15.5 % Final   Platelets 09/28/2022 209  150 - 400 K/uL Final   REPEATED TO VERIFY   nRBC 09/28/2022 0.0  0.0 - 0.2 % Final   Neutrophils Relative % 09/28/2022 55  % Final   Neutro Abs 09/28/2022 5.4  1.7 - 7.7 K/uL Final   Lymphocytes Relative 09/28/2022 34  % Final   Lymphs Abs 09/28/2022 3.3  0.7 - 4.0 K/uL Final   Monocytes Relative 09/28/2022 6  % Final   Monocytes Absolute 09/28/2022 0.6  0.1 - 1.0 K/uL Final  Eosinophils Relative 09/28/2022 4  % Final   Eosinophils Absolute 09/28/2022 0.4  0.0 - 0.5 K/uL Final   Basophils Relative 09/28/2022 1  % Final   Basophils Absolute 09/28/2022 0.1  0.0 - 0.1 K/uL Final   Immature Granulocytes 09/28/2022 0  % Final   Abs Immature Granulocytes 09/28/2022 0.04  0.00  - 0.07 K/uL Final   Performed at [facility], [address], [place], [place] [address]   Sodium 09/28/2022 137  135 - 145 mmol/L Final   Potassium 09/28/2022 3.2 (L)  3.5 - 5.1 mmol/L Final   Chloride 09/28/2022 104  98 - 111 mmol/L Final   CO2 09/28/2022 26  22 - 32 mmol/L Final   Glucose, Bld 09/28/2022 125 (H)  70 - 99 mg/dL Final   Glucose reference range applies only to samples taken after fasting for at least 8 hours.   BUN 09/28/2022 5 (L)  6 - 20 mg/dL Final   Creatinine, Ser 09/28/2022 0.89  0.44 - 1.00 mg/dL Final   Calcium 40/98/1191 8.8 (L)  8.9 - 10.3 mg/dL Final   Total Protein 47/82/9562 6.7  6.5 - 8.1 g/dL Final   Albumin 13/11/6576 3.8  3.5 - 5.0 g/dL Final   AST 46/96/2952 19  15 - 41 U/L Final   ALT 09/28/2022 18  0 - 44 U/L Final   Alkaline Phosphatase 09/28/2022 53  38 - 126 U/L Final   Total Bilirubin 09/28/2022 0.4  0.3 - 1.2 mg/dL Final   GFR, Estimated 09/28/2022 >60  >60 mL/min Final   Comment: (NOTE) Calculated using the CKD-EPI Creatinine Equation (2021)    Anion gap 09/28/2022 7  5 - 15 Final   Performed at [facility], [address] [place], [place] [address]   Hgb A1c MFr Bld 09/28/2022 7.9 (H)  4.8 - 5.6 % Final   Comment: (NOTE)         Prediabetes: 5.7 - 6.4         Diabetes: >6.4         Glycemic control for adults with diabetes: <7.0    Mean Plasma Glucose 09/28/2022 180  mg/dL Final   Comment: (NOTE) Performed At: [facility] [address] [place], [place] [id number] Jolene Schimke MD DG:[id number]    Magnesium 09/28/2022 1.8  1.7 - 2.4 mg/dL Final   Performed at [facility], [address], [place], [place] [address]   Alcohol, Ethyl (B) 09/28/2022 <10  <10 mg/dL Final   Comment: (NOTE) Lowest detectable limit for serum alcohol is 10 mg/dL.  For medical purposes only. Performed at [facility], [address] [place], [place] [address]    Cholesterol 09/28/2022 179  0 - 200 mg/dL Final    Triglycerides 09/28/2022 80  <150 mg/dL Final   HDL 32/95/1884 62  >40 mg/dL Final   Total CHOL/HDL Ratio 09/28/2022 2.9  RATIO Final   VLDL 09/28/2022 16  0 - 40 mg/dL Final   LDL Cholesterol 09/28/2022 101 (H)  0 - 99 mg/dL Final   Comment:        Total Cholesterol/HDL:CHD Risk Coronary Heart Disease Risk Table                     Men   Women  1/2 Average Risk   3.4   3.3  Average Risk       5.0   4.4  2 X Average Risk   9.6  7.1  3 X Average Risk  23.4   11.0        Use the calculated Patient Ratio above and the CHD Risk Table to determine the patient's CHD Risk.        ATP III CLASSIFICATION (LDL):  <100     mg/dL   Optimal  161-096  mg/dL   Near or Above                    Optimal  130-159  mg/dL   Borderline  045-409  mg/dL   High  >811     mg/dL   Very High Performed at [facility], [address], [place], [place] [address]    TSH 09/28/2022 1.374  0.350 - 4.500 uIU/mL Final   Comment: Performed by a 3rd Generation assay with a functional sensitivity of <=0.01 uIU/mL. Performed at [facility], [address] [place], [place] [address]    Prolactin 09/28/2022 26.0  4.8 - 33.4 ng/mL Final   Comment: (NOTE) Performed At: [facility] [address] [place], [place] [id number] Jolene Schimke MD ON:[id number]    Lithium Lvl 09/28/2022 0.19 (L)  0.60 - 1.20 mmol/L Final   Performed at [facility], [address], [place], [place] [address]   Glucose-Capillary 09/29/2022 171 (H)  70 - 99 mg/dL Final   Glucose reference range applies only to samples taken after fasting for at least 8 hours.  Admission on 07/16/2022, Discharged on 07/18/2022  Component Date Value Ref Range Status   Sodium 07/16/2022 138  135 - 145 mmol/L Final   Potassium 07/16/2022 3.5  3.5 - 5.1 mmol/L Final   Chloride 07/16/2022 104  98 - 111 mmol/L Final   CO2 07/16/2022 28  22 - 32 mmol/L Final   Glucose, Bld 07/16/2022 193 (H)  70 - 99 mg/dL Final   Glucose reference  range applies only to samples taken after fasting for at least 8 hours.   BUN 07/16/2022 <5 (L)  6 - 20 mg/dL Final   Creatinine, Ser 07/16/2022 0.85  0.44 - 1.00 mg/dL Final   Calcium 27/25/3664 9.3  8.9 - 10.3 mg/dL Final   Total Protein 40/34/7425 7.6  6.5 - 8.1 g/dL Final   Albumin 95/63/8756 3.9  3.5 - 5.0 g/dL Final   AST 43/32/9518 38  15 - 41 U/L Final   ALT 07/16/2022 40  0 - 44 U/L Final   Alkaline Phosphatase 07/16/2022 55  38 - 126 U/L Final   Total Bilirubin 07/16/2022 0.5  0.3 - 1.2 mg/dL Final   GFR, Estimated 07/16/2022 >60  >60 mL/min Final   Comment: (NOTE) Calculated using the CKD-EPI Creatinine Equation (2021)    Anion gap 07/16/2022 6  5 - 15 Final   Performed at [facility], [address], [place], [place] [address]   Alcohol, Ethyl (B) 07/16/2022 <10  <10 mg/dL Final   Comment: (NOTE) Lowest detectable limit for serum alcohol is 10 mg/dL.  For medical purposes only. Performed at [facility], [address], [place], [place] [address]    Opiates 07/16/2022 NONE DETECTED  NONE DETECTED Final   Cocaine 07/16/2022 NONE DETECTED  NONE DETECTED Final   Benzodiazepines 07/16/2022 NONE DETECTED  NONE DETECTED Final   Amphetamines 07/16/2022 NONE DETECTED  NONE DETECTED Final   Tetrahydrocannabinol 07/16/2022 NONE DETECTED  NONE DETECTED Final   Barbiturates 07/16/2022 NONE DETECTED  NONE DETECTED Final   Comment: (  NOTE) DRUG SCREEN FOR MEDICAL PURPOSES ONLY.  IF CONFIRMATION IS NEEDED FOR ANY PURPOSE, NOTIFY LAB WITHIN 5 DAYS.  LOWEST DETECTABLE LIMITS FOR URINE DRUG SCREEN Drug Class                     Cutoff (ng/mL) Amphetamine and metabolites    1000 Barbiturate and metabolites    200 Benzodiazepine                 200 Opiates and metabolites        300 Cocaine and metabolites        300 THC                            50 Performed at [facility], [address], [place], [place] [address]    WBC 07/16/2022 10.4  4.0 - 10.5 K/uL  Final   RBC 07/16/2022 4.80  3.87 - 5.11 MIL/uL Final   Hemoglobin 07/16/2022 11.4 (L)  12.0 - 15.0 g/dL Final   HCT 40/98/1191 36.9  36.0 - 46.0 % Final   MCV 07/16/2022 76.9 (L)  80.0 - 100.0 fL Final   MCH 07/16/2022 23.8 (L)  26.0 - 34.0 pg Final   MCHC 07/16/2022 30.9  30.0 - 36.0 g/dL Final   RDW 47/82/9562 16.5 (H)  11.5 - 15.5 % Final   Platelets 07/16/2022 310  150 - 400 K/uL Final   nRBC 07/16/2022 0.0  0.0 - 0.2 % Final   Neutrophils Relative % 07/16/2022 60  % Final   Neutro Abs 07/16/2022 6.2  1.7 - 7.7 K/uL Final   Lymphocytes Relative 07/16/2022 33  % Final   Lymphs Abs 07/16/2022 3.4  0.7 - 4.0 K/uL Final   Monocytes Relative 07/16/2022 5  % Final   Monocytes Absolute 07/16/2022 0.6  0.1 - 1.0 K/uL Final   Eosinophils Relative 07/16/2022 2  % Final   Eosinophils Absolute 07/16/2022 0.2  0.0 - 0.5 K/uL Final   Basophils Relative 07/16/2022 0  % Final   Basophils Absolute 07/16/2022 0.0  0.0 - 0.1 K/uL Final   Immature Granulocytes 07/16/2022 0  % Final   Abs Immature Granulocytes 07/16/2022 0.04  0.00 - 0.07 K/uL Final   Performed at [facility], [address], [place], [place] [address]   I-stat hCG, quantitative 07/16/2022 <5.0  <5 mIU/mL Final   Comment 3 07/16/2022          Final   Comment:   GEST. AGE      CONC.  (mIU/mL)   <=1 WEEK        5 - 50     2 WEEKS       50 - 500     3 WEEKS       100 - 10,000     4 WEEKS     1,000 - 30,000        FEMALE AND NON-PREGNANT FEMALE:     LESS THAN 5 mIU/mL    Acetaminophen (Tylenol), Serum 07/16/2022 <10 (L)  10 - 30 ug/mL Final   Comment: (NOTE) Therapeutic concentrations vary significantly. A range of 10-30 ug/mL  may be an effective concentration for many patients. However, some  are best treated at concentrations outside of this range. Acetaminophen concentrations >150 ug/mL at 4 hours after ingestion  and >50 ug/mL at 12 hours after ingestion are often associated with  toxic reactions.  Performed  at [facility], [address] [place], [place] [address]    Salicylate Lvl 07/16/2022 <7.0 (L)  7.0 - 30.0 mg/dL Final   Performed at [facility], [address] [place], [place] [address]   Color, Urine 07/16/2022 YELLOW  YELLOW Final   APPearance 07/16/2022 CLOUDY (A)  CLEAR Final   Specific Gravity, Urine 07/16/2022 >1.030 (H)  1.005 - 1.030 Final   pH 07/16/2022 6.0  5.0 - 8.0 Final   Glucose, UA 07/16/2022 100 (A)  NEGATIVE mg/dL Final   Hgb urine dipstick 07/16/2022 NEGATIVE  NEGATIVE Final   Bilirubin Urine 07/16/2022 SMALL (A)  NEGATIVE Final   Ketones, ur 07/16/2022 NEGATIVE  NEGATIVE mg/dL Final   Protein, ur 84/69/6295 100 (A)  NEGATIVE mg/dL Final   Nitrite 28/41/3244 NEGATIVE  NEGATIVE Final   Leukocytes,Ua 07/16/2022 NEGATIVE  NEGATIVE Final   Performed at [facility], [address] [place], [place] [address]   RPR Ser Ql 07/16/2022 NON REACTIVE  NON REACTIVE Final   Performed at [facility], [address] [place], [place] [address]   Lithium Lvl 07/16/2022 0.06 (L)  0.60 - 1.20 mmol/L Final   Performed at [facility], [address], [place], [place] [address]   HIV Screen 4th Generation wRfx 07/16/2022 Non Reactive  Non Reactive Final   Performed at [facility], [address], [place], [place] [address]   Neisseria Gonorrhea 07/16/2022 Negative   Final   Chlamydia 07/16/2022 Negative   Final   Comment 07/16/2022 Normal Reference Ranger Chlamydia - Negative   Final   Comment 07/16/2022 Normal Reference Range Neisseria Gonorrhea - Negative   Final   Yeast Wet Prep HPF POC 07/16/2022 NONE SEEN  NONE SEEN Final   Trich, Wet Prep 07/16/2022 NONE SEEN  NONE SEEN Final   Clue Cells Wet Prep HPF POC 07/16/2022 NONE SEEN  NONE SEEN Final   WBC, Wet Prep HPF POC 07/16/2022 <10  <10 Final   Sperm 07/16/2022 NONE SEEN   Final   Performed at [facility], [address], [place], [place] [address]   RBC / HPF 07/16/2022 0-5  0 - 5  RBC/hpf Final   WBC, UA 07/16/2022 0-5  0 - 5 WBC/hpf Final   Bacteria, UA 07/16/2022 MANY (A)  NONE SEEN Final   Squamous Epithelial / HPF 07/16/2022 0-5  0 - 5 /HPF Final   Mucus 07/16/2022 PRESENT   Final   Performed at [facility], [address], [place], [place] [address]   Specimen Description 07/16/2022 URINE, CLEAN CATCH   Final   Special Requests 07/16/2022    Final                   Value:NONE Performed at [facility], [address], [place], [place] [address]    Culture 07/16/2022 MULTIPLE SPECIES PRESENT, SUGGEST RECOLLECTION (A)   Final   Report Status 07/16/2022 07/18/2022 FINAL   Final   Glucose-Capillary 07/17/2022 227 (H)  70 - 99 mg/dL Final   Glucose reference range applies only to samples taken after fasting for at least 8 hours.   SARS Coronavirus 2 by RT PCR 07/17/2022 NEGATIVE  NEGATIVE Final   Influenza A by PCR 07/17/2022 NEGATIVE  NEGATIVE Final   Influenza B by PCR 07/17/2022 NEGATIVE  NEGATIVE Final   Comment: (NOTE) The Xpert Xpress SARS-CoV-2/FLU/RSV plus assay is intended as an aid  in the diagnosis of influenza from Nasopharyngeal swab specimens and should not be used as a sole basis for treatment. Nasal washings and aspirates are unacceptable for Xpert Xpress SARS-CoV-2/FLU/RSV testing.  Fact Sheet for Patients: BloggerCourse.com  Fact Sheet for Healthcare Providers: SeriousBroker.it  This test is not yet approved or cleared by the [place] FDA and has been authorized for detection and/or diagnosis of SARS-CoV-2 by FDA under an Emergency Use Authorization (EUA). This EUA will remain in effect (meaning this test can be used) for the duration of the COVID-19 declaration under Section 564(b)(1) of the Act, 21 U.S.C. section 360bbb-3(b)(1), unless the authorization is terminated or revoked.     Resp Syncytial Virus by PCR 07/17/2022 NEGATIVE  NEGATIVE Final   Comment:  (NOTE) Fact Sheet for Patients: BloggerCourse.com  Fact Sheet for Healthcare Providers: SeriousBroker.it  This test is not yet approved or cleared by the [place] FDA and has been authorized for detection and/or diagnosis of SARS-CoV-2 by FDA under an Emergency Use Authorization (EUA). This EUA will remain in effect (meaning this test can be used) for the duration of the COVID-19 declaration under Section 564(b)(1) of the Act, 21 U.S.C. section 360bbb-3(b)(1), unless the authorization is terminated or revoked.  Performed at [facility], [address], [place], [place] [address]    Glucose-Capillary 07/17/2022 266 (H)  70 - 99 mg/dL Final   Glucose reference range applies only to samples taken after fasting for at least 8 hours.   Glucose-Capillary 07/17/2022 225 (H)  70 - 99 mg/dL Final   Glucose reference range applies only to samples taken after fasting for at least 8 hours.   Glucose-Capillary 07/17/2022 244 (H)  70 - 99 mg/dL Final   Glucose reference range applies only to samples taken after fasting for at least 8 hours.   Comment 1 07/17/2022 Notify RN   Final   Comment 2 07/17/2022 Document in Chart   Final   Glucose-Capillary 07/18/2022 217 (H)  70 - 99 mg/dL Final   Glucose reference range applies only to samples taken after fasting for at least 8 hours.  Admission on 07/09/2022, Discharged on 07/09/2022  Component Date Value Ref Range Status   SARS Coronavirus 2 by RT PCR 07/09/2022 NEGATIVE  NEGATIVE Final   Comment: (NOTE) SARS-CoV-2 target nucleic acids are NOT DETECTED.  The SARS-CoV-2 RNA is generally detectable in upper respiratory specimens during the acute phase of infection. The lowest concentration of SARS-CoV-2 viral copies this assay can detect is 138 copies/mL. A negative result does not preclude SARS-Cov-2 infection and should not be used as the sole basis for treatment or other patient  management decisions. A negative result may occur with  improper specimen collection/handling, submission of specimen other than nasopharyngeal swab, presence of viral mutation(s) within the areas targeted by this assay, and inadequate number of viral copies(<138 copies/mL). A negative result must be combined with clinical observations, patient history, and epidemiological information. The expected result is Negative.  Fact Sheet for Patients:  BloggerCourse.com  Fact Sheet for Healthcare Providers:  SeriousBroker.it  This test is no                          t yet approved or cleared by the [place] FDA and  has been authorized for detection and/or diagnosis of SARS-CoV-2 by FDA under an Emergency Use Authorization (EUA). This EUA will remain  in effect (meaning this test can be used) for the  duration of the COVID-19 declaration under Section 564(b)(1) of the Act, 21 U.S.C.section 360bbb-3(b)(1), unless the authorization is terminated  or revoked sooner.       Influenza A by PCR 07/09/2022 NEGATIVE  NEGATIVE Final   Influenza B by PCR 07/09/2022 POSITIVE (A)  NEGATIVE Final   Comment: (NOTE) The Xpert Xpress SARS-CoV-2/FLU/RSV plus assay is intended as an aid in the diagnosis of influenza from Nasopharyngeal swab specimens and should not be used as a sole basis for treatment. Nasal washings and aspirates are unacceptable for Xpert Xpress SARS-CoV-2/FLU/RSV testing.  Fact Sheet for Patients: BloggerCourse.com  Fact Sheet for Healthcare Providers: SeriousBroker.it  This test is not yet approved or cleared by the [place] FDA and has been authorized for detection and/or diagnosis of SARS-CoV-2 by FDA under an Emergency Use Authorization (EUA). This EUA will remain in effect (meaning this test can be used) for the duration of the COVID-19 declaration under Section  564(b)(1) of the Act, 21 U.S.C. section 360bbb-3(b)(1), unless the authorization is terminated or revoked.     Resp Syncytial Virus by PCR 07/09/2022 NEGATIVE  NEGATIVE Final   Comment: (NOTE) Fact Sheet for Patients: BloggerCourse.com  Fact Sheet for Healthcare Providers: SeriousBroker.it  This test is not yet approved or cleared by the [place] FDA and has been authorized for detection and/or diagnosis of SARS-CoV-2 by FDA under an Emergency Use Authorization (EUA). This EUA will remain in effect (meaning this test can be used) for the duration of the COVID-19 declaration under Section 564(b)(1) of the Act, 21 U.S.C. section 360bbb-3(b)(1), unless the authorization is terminated or revoked.  Performed at [facility], [address] [place], [place] [address]    Sodium 07/09/2022 135  135 - 145 mmol/L Final   Potassium 07/09/2022 3.7  3.5 - 5.1 mmol/L Final   Chloride 07/09/2022 96 (L)  98 - 111 mmol/L Final   CO2 07/09/2022 25  22 - 32 mmol/L Final   Glucose, Bld 07/09/2022 347 (H)  70 - 99 mg/dL Final   Glucose reference range applies only to samples taken after fasting for at least 8 hours.   BUN 07/09/2022 6  6 - 20 mg/dL Final   Creatinine, Ser 07/09/2022 1.13 (H)  0.44 - 1.00 mg/dL Final   Calcium 98/02/9146 9.2  8.9 - 10.3 mg/dL Final   Total Protein 82/95/6213 9.0 (H)  6.5 - 8.1 g/dL Final   Albumin 08/65/7846 4.6  3.5 - 5.0 g/dL Final   AST 96/29/5284 34  15 - 41 U/L Final   ALT 07/09/2022 27  0 - 44 U/L Final   Alkaline Phosphatase 07/09/2022 55  38 - 126 U/L Final   Total Bilirubin 07/09/2022 0.3  0.3 - 1.2 mg/dL Final   GFR, Estimated 07/09/2022 >60  >60 mL/min Final   Comment: (NOTE) Calculated using the CKD-EPI Creatinine Equation (2021)    Anion gap 07/09/2022 14  5 - 15 Final   Performed at [facility], [address], [place], [place] [address]   Alcohol, Ethyl  (B) 07/09/2022 <10  <10 mg/dL Final   Comment: (NOTE) Lowest detectable limit for serum alcohol is 10 mg/dL.  For medical purposes only. Performed at [facility], [address], [place], [place] [address]    Salicylate Lvl 07/09/2022 <7.0 (L)  7.0 - 30.0 mg/dL Final   Performed at [facility], [address], [place], [place] [address]   Acetaminophen (Tylenol), Serum 07/09/2022 <10 (L)  10 - 30 ug/mL Final   Comment: (NOTE) Therapeutic concentrations vary significantly. A range of 10-30 ug/mL  may be an effective concentration for many patients. However, some  are best treated at concentrations outside of this range. Acetaminophen concentrations >150 ug/mL at 4 hours after ingestion  and >50 ug/mL at 12 hours after ingestion are often associated with  toxic reactions.  Performed at [facility], [address] [place], [place] [address]    WBC 07/09/2022 6.9  4.0 - 10.5 K/uL Final   RBC 07/09/2022 5.42 (H)  3.87 - 5.11 MIL/uL Final   Hemoglobin 07/09/2022 12.8  12.0 - 15.0 g/dL Final   HCT 09/81/1914 42.2  36.0 - 46.0 % Final   MCV 07/09/2022 77.9 (L)  80.0 - 100.0 fL Final   MCH 07/09/2022 23.6 (L)  26.0 - 34.0 pg Final   MCHC 07/09/2022 30.3  30.0 - 36.0 g/dL Final   RDW 78/29/5621 16.7 (H)  11.5 - 15.5 % Final   Platelets 07/09/2022 219  150 - 400 K/uL Final   Comment: SPECIMEN CHECKED FOR CLOTS REPEATED TO VERIFY    nRBC 07/09/2022 0.0  0.0 - 0.2 % Final   Performed at [facility], [address], [place], [place] [address]   I-stat hCG, quantitative 07/09/2022 <5.0  <5 mIU/mL Final   Comment 3 07/09/2022          Final   Comment:   GEST. AGE      CONC.  (mIU/mL)   <=1 WEEK        5 - 50     2 WEEKS       50 - 500     3 WEEKS       100 - 10,000     4 WEEKS     1,000 - 30,000        FEMALE AND NON-PREGNANT FEMALE:     LESS THAN 5 mIU/mL   Admission on 07/05/2022, Discharged on 07/06/2022   Component Date Value Ref Range Status   WBC 07/05/2022 11.6 (H)  4.0 - 10.5 K/uL Final   RBC 07/05/2022 4.67  3.87 - 5.11 MIL/uL Final   Hemoglobin 07/05/2022 11.3 (L)  12.0 - 15.0 g/dL Final   HCT 78/46/9629 38.4  36.0 - 46.0 % Final   MCV 07/05/2022 82.2  80.0 - 100.0 fL Final   MCH 07/05/2022 24.2 (L)  26.0 - 34.0 pg Final   MCHC 07/05/2022 29.4 (L)  30.0 - 36.0 g/dL Final   RDW 52/84/1324 16.6 (H)  11.5 - 15.5 % Final   Platelets 07/05/2022 221  150 - 400 K/uL Final   nRBC 07/05/2022 0.0  0.0 - 0.2 % Final   Performed at [facility], [address], [place], [place] [address]   Sodium 07/05/2022 138  135 - 145 mmol/L Final   Potassium 07/05/2022 3.9  3.5 - 5.1 mmol/L Final   HEMOLYSIS AT THIS LEVEL MAY AFFECT RESULT   Chloride 07/05/2022 106  98 - 111 mmol/L Final   CO2 07/05/2022 26  22 - 32 mmol/L Final   Glucose, Bld 07/05/2022 172 (H)  70 - 99 mg/dL Final   Glucose reference range applies only to samples taken after fasting for at least 8 hours.   BUN 07/05/2022 6  6 - 20 mg/dL Final   Creatinine, Ser 07/05/2022 0.73  0.44 - 1.00 mg/dL Final   Calcium 72/53/6644 9.4  8.9 - 10.3 mg/dL Final  GFR, Estimated 07/05/2022 >60  >60 mL/min Final   Comment: (NOTE) Calculated using the CKD-EPI Creatinine Equation (2021)    Anion gap 07/05/2022 6  5 - 15 Final   Performed at [facility], [address], [place], [place] [address]   Alcohol, Ethyl (B) 07/05/2022 <10  <10 mg/dL Final   Comment: (NOTE) Lowest detectable limit for serum alcohol is 10 mg/dL.  For medical purposes only. Performed at [facility], [address] [place], [place] [address]    Acetaminophen (Tylenol), Serum 07/05/2022 <10 (L)  10 - 30 ug/mL Final   Comment: (NOTE) Therapeutic concentrations vary significantly. A range of 10-30 ug/mL  may be an effective concentration for many patients. However, some  are best treated at concentrations outside  of this range. Acetaminophen concentrations >150 ug/mL at 4 hours after ingestion  and >50 ug/mL at 12 hours after ingestion are often associated with  toxic reactions.  Performed at [facility], [address] [place], [place] [address]    Salicylate Lvl 07/05/2022 <7.0 (L)  7.0 - 30.0 mg/dL Final   Performed at [facility] [address], [place], [place] [address]   Opiates 07/05/2022 NONE DETECTED  NONE DETECTED Final   Cocaine 07/05/2022 NONE DETECTED  NONE DETECTED Final   Benzodiazepines 07/05/2022 NONE DETECTED  NONE DETECTED Final   Amphetamines 07/05/2022 NONE DETECTED  NONE DETECTED Final   Tetrahydrocannabinol 07/05/2022 NONE DETECTED  NONE DETECTED Final   Barbiturates 07/05/2022 NONE DETECTED  NONE DETECTED Final   Comment: (NOTE) DRUG SCREEN FOR MEDICAL PURPOSES ONLY.  IF CONFIRMATION IS NEEDED FOR ANY PURPOSE, NOTIFY LAB WITHIN 5 DAYS.  LOWEST DETECTABLE LIMITS FOR URINE DRUG SCREEN Drug Class                     Cutoff (ng/mL) Amphetamine and metabolites    1000 Barbiturate and metabolites    200 Benzodiazepine                 200 Opiates and metabolites        300 Cocaine and metabolites        300 THC                            50 Performed at [facility], [address] [place], [place] [address]    SARS Coronavirus 2 by RT PCR 07/05/2022 NEGATIVE  NEGATIVE Final   Comment: (NOTE) SARS-CoV-2 target nucleic acids are NOT DETECTED.  The SARS-CoV-2 RNA is generally detectable in upper respiratory specimens during the acute phase of infection. The lowest concentration of SARS-CoV-2 viral copies this assay can detect is 138 copies/mL. A negative result does not preclude SARS-Cov-2 infection and should not be used as the sole basis for treatment or other patient management decisions. A negative result may occur with  improper specimen collection/handling, submission of specimen other than  nasopharyngeal swab, presence of viral mutation(s) within the areas targeted by this assay, and inadequate number of viral copies(<138 copies/mL). A negative result must be combined with clinical observations, patient history, and epidemiological information. The expected result is Negative.  Fact Sheet for Patients:  BloggerCourse.com  Fact Sheet for Healthcare Providers:  SeriousBroker.it  This test is no                          t yet approved  or cleared by the [place] and  has been authorized for detection and/or diagnosis of SARS-CoV-2 by FDA under an Emergency Use Authorization (EUA). This EUA will remain  in effect (meaning this test can be used) for the duration of the COVID-19 declaration under Section 564(b)(1) of the Act, 21 U.S.C.section 360bbb-3(b)(1), unless the authorization is terminated  or revoked sooner.       Influenza A by PCR 07/05/2022 NEGATIVE  NEGATIVE Final   Influenza B by PCR 07/05/2022 POSITIVE (A)  NEGATIVE Final   Comment: (NOTE) The Xpert Xpress SARS-CoV-2/FLU/RSV plus assay is intended as an aid in the diagnosis of influenza from Nasopharyngeal swab specimens and should not be used as a sole basis for treatment. Nasal washings and aspirates are unacceptable for Xpert Xpress SARS-CoV-2/FLU/RSV testing.  Fact Sheet for Patients: BloggerCourse.com  Fact Sheet for Healthcare Providers: SeriousBroker.it  This test is not yet approved or cleared by the [place] FDA and has been authorized for detection and/or diagnosis of SARS-CoV-2 by FDA under an Emergency Use Authorization (EUA). This EUA will remain in effect (meaning this test can be used) for the duration of the COVID-19 declaration under Section 564(b)(1) of the Act, 21 U.S.C. section 360bbb-3(b)(1), unless the authorization is terminated or revoked.     Resp Syncytial Virus  by PCR 07/05/2022 NEGATIVE  NEGATIVE Final   Comment: (NOTE) Fact Sheet for Patients: BloggerCourse.com  Fact Sheet for Healthcare Providers: SeriousBroker.it  This test is not yet approved or cleared by the [place] FDA and has been authorized for detection and/or diagnosis of SARS-CoV-2 by FDA under an Emergency Use Authorization (EUA). This EUA will remain in effect (meaning this test can be used) for the duration of the COVID-19 declaration under Section 564(b)(1) of the Act, 21 U.S.C. section 360bbb-3(b)(1), unless the authorization is terminated or revoked.  Performed at [facility], [address], [place], [place] [address]   Admission on 07/03/2022, Discharged on 07/03/2022  Component Date Value Ref Range Status   SARS Coronavirus 2 by RT PCR 07/03/2022 NEGATIVE  NEGATIVE Final   Influenza A by PCR 07/03/2022 NEGATIVE  NEGATIVE Final   Influenza B by PCR 07/03/2022 NEGATIVE  NEGATIVE Final   Comment: (NOTE) The Xpert Xpress SARS-CoV-2/FLU/RSV plus assay is intended as an aid in the diagnosis of influenza from Nasopharyngeal swab specimens and should not be used as a sole basis for treatment. Nasal washings and aspirates are unacceptable for Xpert Xpress SARS-CoV-2/FLU/RSV testing.  Fact Sheet for Patients: BloggerCourse.com  Fact Sheet for Healthcare Providers: SeriousBroker.it  This test is not yet approved or cleared by the [place] FDA and has been authorized for detection and/or diagnosis of SARS-CoV-2 by FDA under an Emergency Use Authorization (EUA). This EUA will remain in effect (meaning this test can be used) for the duration of the COVID-19 declaration under Section 564(b)(1) of the Act, 21 U.S.C. section 360bbb-3(b)(1), unless the authorization is terminated or revoked.     Resp Syncytial Virus by PCR 07/03/2022 NEGATIVE   NEGATIVE Final   Comment: (NOTE) Fact Sheet for Patients: BloggerCourse.com  Fact Sheet for Healthcare Providers: SeriousBroker.it  This test is not yet approved or cleared by the [place] FDA and has been authorized for detection and/or diagnosis of SARS-CoV-2 by FDA under an Emergency Use Authorization (EUA). This EUA will remain in effect (meaning this test can be used) for the duration of the COVID-19 declaration under Section 564(b)(1) of the Act, 21 U.S.C. section 360bbb-3(b)(1),  unless the authorization is terminated or revoked.  Performed at [facility], [address], [place], [place] [address]    POC Amphetamine UR 07/03/2022 None Detected  NONE DETECTED (Cut Off Level 1000 ng/mL) Final   POC Secobarbital (BAR) 07/03/2022 None Detected  NONE DETECTED (Cut Off Level 300 ng/mL) Final   POC Buprenorphine (BUP) 07/03/2022 None Detected  NONE DETECTED (Cut Off Level 10 ng/mL) Final   POC Oxazepam (BZO) 07/03/2022 None Detected  NONE DETECTED (Cut Off Level 300 ng/mL) Final   POC Cocaine UR 07/03/2022 None Detected  NONE DETECTED (Cut Off Level 300 ng/mL) Final   POC Methamphetamine UR 07/03/2022 None Detected  NONE DETECTED (Cut Off Level 1000 ng/mL) Final   POC Morphine 07/03/2022 None Detected  NONE DETECTED (Cut Off Level 300 ng/mL) Final   POC Methadone UR 07/03/2022 None Detected  NONE DETECTED (Cut Off Level 300 ng/mL) Final   POC Oxycodone UR 07/03/2022 None Detected  NONE DETECTED (Cut Off Level 100 ng/mL) Final   POC Marijuana UR 07/03/2022 None Detected  NONE DETECTED (Cut Off Level 50 ng/mL) Final   Preg Test, Ur 07/03/2022 Negative  Negative Final   SARSCOV2ONAVIRUS 2 AG 07/03/2022 NEGATIVE  NEGATIVE Final   Comment: (NOTE) SARS-CoV-2 antigen NOT DETECTED.   Negative results are presumptive.  Negative results do not preclude SARS-CoV-2 infection and should not be used as the sole basis for treatment or  other patient management decisions, including infection  control decisions, particularly in the presence of clinical signs and  symptoms consistent with COVID-19, or in those who have been in contact with the virus.  Negative results must be combined with clinical observations, patient history, and epidemiological information. The expected result is Negative.  Fact Sheet for Patients: https://www.jennings-kim.com/  Fact Sheet for Healthcare Providers: https://alexander-rogers.biz/  This test is not yet approved or cleared by the [place] FDA and  has been authorized for detection and/or diagnosis of SARS-CoV-2 by FDA under an Emergency Use Authorization (EUA).  This EUA will remain in effect (meaning this test can be used) for the duration of  the COV                          ID-19 declaration under Section 564(b)(1) of the Act, 21 U.S.C. section 360bbb-3(b)(1), unless the authorization is terminated or revoked sooner.     Preg Test, Ur 07/03/2022 NEGATIVE  NEGATIVE Final   Comment:        THE SENSITIVITY OF THIS METHODOLOGY IS >24 mIU/mL    Glucose-Capillary 07/03/2022 280 (H)  70 - 99 mg/dL Final   Glucose reference range applies only to samples taken after fasting for at least 8 hours.    Allergies: Wellbutrin [bupropion], Omnipaque [iohexol], Penicillins, Atarax [hydroxyzine], Contrast media [iodinated contrast media], and Cymbalta [duloxetine hcl]  Medications:  PTA Medications  Medication Sig   metoprolol tartrate (LOPRESSOR) 25 MG tablet Take 25 mg by mouth 2 (two) times daily.   Cholecalciferol (VITAMIN D3) 25 MCG (1000 UT) CAPS Take 1,000 Units by mouth daily.   simvastatin (ZOCOR) 40 MG tablet Take 40 mg by mouth at bedtime.   amLODipine (NORVASC) 10 MG tablet Take 10 mg by mouth daily.   insulin aspart protamine- aspart (NOVOLOG MIX 70/30) (70-30) 100 UNIT/ML injection Inject 20 Units into the skin 2 (two) times daily with a meal.    traZODone (DESYREL) 50 MG tablet Take 1 tablet (50 mg total) by mouth at  bedtime as needed for sleep. (Patient not taking: Reported on 07/06/2022)   lithium carbonate (ESKALITH) 450 MG ER tablet Take 1 tablet (450 mg total) by mouth at bedtime. For mood stabilization   benztropine (COGENTIN) 0.5 MG tablet Take 1 tablet (0.5 mg total) by mouth 2 (two) times daily. For eps   gabapentin (NEURONTIN) 100 MG capsule Take 1 capsule (100 mg total) by mouth 3 (three) times daily. For agitation   diclofenac Sodium (VOLTAREN) 1 % GEL Apply 2 g topically 4 (four) times daily.   venlafaxine XR (EFFEXOR-XR) 150 MG 24 hr capsule Take 150 mg by mouth daily with breakfast.   clomiPRAMINE (ANAFRANIL) 25 MG capsule Take 25 mg by mouth at bedtime. Take 1 capsule at bedtime for 3 days then 2 capsules for 3 days then 3 capsules at bedtime   haloperidol (HALDOL) 10 MG tablet Take 1 tablet (10 mg total) by mouth at bedtime.   benzonatate (TESSALON) 100 MG capsule Take 1 capsule (100 mg total) by mouth every 8 (eight) hours.   ondansetron (ZOFRAN-ODT) 4 MG disintegrating tablet 4mg  ODT q4 hours prn nausea/vomit      Medical Decision Making  Patient case reviewed and discussed with Dr Jannifer Franklin.  Patient is actively suicidal and is a danger to herself.  She is acting out banging on doors, loud and pressured.  She meets criteria for inpatient psychiatric hospitalization.      Recommendations  Recommend inpatient psychiatric hospitalization.  Thomes Lolling, NP 01/02/23  3:32 PM

## 2023-01-02 NOTE — Progress Notes (Signed)
   01/02/23 1508  BHUC Triage Screening (Walk-ins at [facility] only)  How Did You Hear About [place]? Legal System  What Is the Reason for Your Visit/Call Today? Pt presents to [facility] per [facility] voluntarily. Pt mentions that she is very anxious and agitated. Pt states, "my mother is mean to me and I have a bad relationship with my boyfriend." Pt reports that she feels like she may harm herself by burning her arm. Pt reports that she wants to up her dosage of Buspar due to her worsening anxiety. Pt reports she smoked weed 3 hours ago. Pt states, "I just want to take the pain away and why is this happening to me". Pt is requesting inpatient at this time of assessment. Pt denies HI and AVH.  How Long Has This Been Causing You Problems? <Week  Have You Recently Had Any Thoughts About Hurting Yourself? Yes  How long ago did you have thoughts about hurting yourself? yesterday  Are You Planning to Commit Suicide/Harm Yourself At This time? Yes  Have you Recently Had Thoughts About Hurting Someone [name]? No  Are You Planning To Harm Someone At This Time? No  Are you currently experiencing any auditory, visual or other hallucinations? No  Have You Used Any Alcohol or Drugs in the Past 24 Hours? Yes  How long ago did you use Drugs or Alcohol? 3 hours  What Did You Use and How Much? 7 puffs of weed  Do you have any current medical co-morbidities that require immediate attention? No  Clinician description of patient physical appearance/behavior: anxious, tearful, agitated, disoriented  What Do You Feel Would Help You the Most Today? Medication(s);Treatment for Depression or other mood problem  If access to [facility] was not available, would you have sought care in the Emergency Department? No  Determination of Need Urgent (48 hours)  Options For Referral Outpatient Therapy;Inpatient Hospitalization

## 2023-01-02 NOTE — ED Notes (Signed)
Pt admitted to Observation unit. Prior to admission patient exhibiting extremely labile behaviors, screaming in lobby and screaming in assessment room. She also could be heard beating doors . NP [name] provided orders for emergent medications. When patient was approached and informed she would be getting medications and admittance to the unit, her behaviors improved. She states '' I've been out there having lots of sex and I need to get STD tested. I haven't had a shower in months and I'm hungry, I want chinese food. '' Pt was cooperative with work up. Attempted blood draw x 1 and another LPn attempted but attempts unsuccessful. Pt given meal and juice and encouraged po hydration.

## 2023-01-02 NOTE — ED Notes (Signed)
Patient observed/assessed at bedside. Alert and oriented to place and person. Patient verbalizes no complaints at this time and appears sedated(post administration of Geodon and ativan.) Patient denies S/I and H/I. Will continue to monitor/support.

## 2023-01-02 NOTE — BH Assessment (Signed)
Comprehensive Clinical Assessment (CCA) Note  01/02/2023 [name] [id number]  Disposition: Per Phebe Colla, NP Patient is recommended for inpatient for observation and continuous assessment.   The patient demonstrates the following risk factors for suicide: Chronic risk factors for suicide include: psychiatric disorder of schizoaffective disorder, bipolar 1,  . Acute risk factors for suicide include: family or marital conflict. Protective factors for this patient include: positive therapeutic relationship. Considering these factors, the overall suicide risk at this point appears to be high. Patient is not appropriate for outpatient follow up.  [name] is a 32 year-old female who presents voluntarily  to [facility] accompanied by [employer] due to SI.  Patient Pt presents to [facility] per [facility] voluntarily. Pt mentions that she is very anxious and agitated. Patient reports that she feels like she may harm herself by taking all of her medications  Per provider note  Patient "reports that she has a plan to kill herself by burning her arms with cigarettes; she says "I can take the pain" and repeats this a few times. She reports that she has attempted in the past.  It was noted that patient laughed when she stated medical personnel told her that she almost died from an overdose. Patient  states, "my mother is mean to me and I have a bad relationship with my boyfriend." Patient was observed throughout the assessment, yelling at people who were not present, even threatening to hurt someone. Patient acknowledge that she smoked marijuana earlier today. Patient denies homicidal ideations.    Patient reports being a part of an ACT team but states they are not helping her at all and she needs another one. Patient lives alone but says her lease is up and her ACT team person did not come and help her with the lease renewal. Patient  reports  she has 3 daughters and doesn't want any of them, because "they cry too  much."  MSE: During the assessment, patient is pacing the floor.  She will yell  then and will quiet down when asked why she is yelling.  Patient appears to alert and oriented x 4. Patient's   mood is irritable, angry, labile with congruent affect. Her speech is loud and pressured.  Patient appears to be responding to internal stimuli as she yelled several times at someone who was not present.    Chief Complaint:  Chief Complaint  Patient presents with   Manic Behavior   Visit Diagnosis: Suicidal ideations                              Manic behavior   CCA Screening, Triage and Referral (STR)  Patient Reported Information How did you hear about [place]? Legal System  What Is the Reason for Your Visit/Call Today? Pt presents to [facility] per [facility] voluntarily. Pt mentions that she is very anxious and agitated. Pt states, "my mother is mean to me and I have a bad relationship with my boyfriend." Pt reports that she feels like she may harm herself by burning her arm. Pt reports that she wants to up her dosage of Buspar due to her worsening anxiety. Pt reports she smoked weed 3 hours ago. Pt states, "I just want to take the pain away and why is this happening to me". Pt is requesting inpatient at this time of assessment. Pt denies HI and AVH.  How Long Has This Been Causing You Problems? <Week  What Do  You Feel Would Help You the Most Today? Treatment for Depression or other mood problem   Have You Recently Had Any Thoughts About Hurting Yourself? Yes  Are You Planning to Commit Suicide/Harm Yourself At This time? Yes   Flowsheet Row ED from 01/02/2023 in [facility] ED from 12/19/2022 in [facility] Emergency Department at [facility] ED from 12/16/2022 in [facility]  C-SSRS RISK CATEGORY High Risk No Risk Low Risk       Have you Recently Had Thoughts About Hurting Someone [name]? No  Are You Planning to Harm Someone at This Time?  No  Explanation: N/A   Have You Used Any Alcohol or Drugs in the Past 24 Hours? Yes  What Did You Use and How Much? marijuana   Do You Currently Have a Therapist/Psychiatrist? No  Name of Therapist/Psychiatrist: Name of Therapist/Psychiatrist: Pt is supposed to have ACTT services   Have You Been Recently Discharged From Any Office Practice or Programs? No  Explanation of Discharge From Practice/Program: N/A     CCA Screening Triage Referral Assessment Type of Contact: Face-to-Face  Telemedicine Service Delivery:   Is this Initial or Reassessment?   Date Telepsych consult ordered in [facility]:    Time Telepsych consult ordered in [facility]:    Location of Assessment: [facility] Assessment Services  Provider Location: [facility] Assessment Services   Collateral Involvement: N/A   Does Patient Have a Automotive engineer Guardian? No  Legal Guardian Contact Information: Pt is her own legal gurdian  Copy of Legal Guardianship Form: -- (pt is her own legal guardian)  Legal Guardian Notified of Arrival: -- (N/A)  Legal Guardian Notified of Pending Discharge: -- (N/A)  If Minor and Not Living with Parent(s), Who has Custody? pt is not a minor  Is CPS involved or ever been involved? Never  Is APS involved or ever been involved? Never   Patient Determined To Be At Risk for Harm To Self or Others Based on Review of Patient Reported Information or Presenting Complaint? Yes, for Self-Harm  Method: Plan with intent and identified person  Availability of Means: In hand or used  Intent: Intends to cause physical harm but not necessarily death  Notification Required: No need or identified person (N/A, no HI)  Additional Information for Danger to Others Potential: Active psychosis (N/A, no HI)  Additional Comments for Danger to Others Potential: N/A, no HI  Are There Guns or Other Weapons in Your Home? No  Types of Guns/Weapons: N/A  Are These Weapons Safely Secured?                             No (N/A)  Who Could Verify You Are Able To Have These Secured: N/A  Do You Have any Outstanding Charges, Pending Court Dates, Parole/Probation? Pt denies  Contacted To Inform of Risk of Harm To Self or Others: Other: Comment (No permission given)   Does Patient Present under Involuntary Commitment? No   [place] of Residence: [place]   Patient Currently Receiving the Following Services: ACTT Psychologist, educational); Medication Management   Determination of Need: Urgent (48 hours)   Options For Referral: Inpatient Hospitalization; Outpatient Therapy; Medication Management     CCA Biopsychosocial Patient Reported Schizophrenia/Schizoaffective Diagnosis in Past: Yes   Strengths: Pt. is engaged in ACTT   Mental Health Symptoms Depression:   Sleep (too much or little); Irritability; Hopelessness; Worthlessness  Duration of Depressive symptoms:    Mania:   Change in energy/activity; Increased Energy; Irritability; Recklessness   Anxiety:    Worrying; Tension   Psychosis:   Hallucinations   Duration of Psychotic symptoms:  Duration of Psychotic Symptoms: Less than six months   Trauma:   None   Obsessions:   None   Compulsions:   None   Inattention:   None   Hyperactivity/Impulsivity:   N/A   Oppositional/Defiant Behaviors:   N/A   Emotional Irregularity:   N/A   Other Mood/Personality Symptoms:   None    Mental Status Exam Appearance and self-care  Stature:   Average   Weight:   Obese   Clothing:   Casual   Grooming:   Normal   Cosmetic use:   Age appropriate   Posture/gait:   Tense   Motor activity:   Agitated   Sensorium  Attention:   Vigilant   Concentration:   Preoccupied; Variable   Orientation:   Object; Person; Place; Time   Recall/memory:   Normal   Affect and Mood  Affect:   Other (Comment) (Pt's mood vacillates between appropriate and inappropriate)   Mood:   Irritable;  Negative   Relating  Eye contact:   Normal   Facial expression:   Angry   Attitude toward examiner:   Defensive   Thought and Language  Speech flow:  Loud; Pressured   Thought content:   Delusions   Preoccupation:   None   Hallucinations:   Auditory; Visual   Organization:   Loose   Company secretary of Knowledge:   Average   Intelligence:   Below average   Abstraction:   Functional   Judgement:   Fair   Reality Testing:   Distorted   Insight:   Lacking   Decision Making:   Impulsive   Social Functioning  Social Maturity:   Impulsive; Irresponsible   Social Judgement:   Heedless   Stress  Stressors:   Family conflict   Coping Ability:   Overwhelmed   Skill Deficits:   Intellect/education; Programmer, applications; Interpersonal; Communication   Supports:   Support needed; Friends/Service system     Religion: Religion/Spirituality Are You A Religious Person?: No How Might This Affect Treatment?: N/A  Leisure/Recreation: Leisure / Recreation Do You Have Hobbies?: No  Exercise/Diet: Exercise/Diet Do You Exercise?: No Have You Gained or Lost A Significant Amount of Weight in the Past Six Months?: No Do You Follow a Special Diet?: No Do You Have Any Trouble Sleeping?: No   CCA Employment/Education Employment/Work Situation: Employment / Work Systems developer: On disability Patient's Job has Been Impacted by Current Illness: No Has Patient ever Been in Equities trader?: No  Education: Education Last Grade Completed: 11 Did You Product manager?: No Did You Have An Individualized Education Program (IIEP): No Did You Have Any Difficulty At School?: No   CCA Family/Childhood History Family and Relationship History: Family history Does patient have children?: Yes How many children?: 3 How is patient's relationship with their children?: Poor relationship as she no  longer has custody.  Childhood History:   Childhood History By whom was/is the patient raised?: Mother Did patient suffer any verbal/emotional/physical/sexual abuse as a child?: No Did patient suffer from severe childhood neglect?: No Has patient ever been sexually abused/assaulted/raped as an adolescent or adult?: No Was the patient ever a victim of a crime or a disaster?: No Witnessed domestic violence?: No Has patient been affected by domestic violence  as an adult?: No       CCA Substance Use Alcohol/Drug Use: Alcohol / Drug Use Pain Medications: See MAR Prescriptions: See MAR Over the Counter: See MAR History of alcohol / drug use?: No history of alcohol / drug abuse Longest period of sobriety (when/how long): N/A Negative Consequences of Use:  (N/A) Withdrawal Symptoms:  (N/A)                         ASAM's:  Six Dimensions of Multidimensional Assessment  Dimension 1:  Acute Intoxication and/or Withdrawal Potential:   Dimension 1:  Description of individual's past and current experiences of substance use and withdrawal: NA  Dimension 2:  Biomedical Conditions and Complications:   Dimension 2:  Description of patient's biomedical conditions and  complications: NA  Dimension 3:  Emotional, Behavioral, or Cognitive Conditions and Complications:  Dimension 3:  Description of emotional, behavioral, or cognitive conditions and complications: NA  Dimension 4:  Readiness to Change:  Dimension 4:  Description of Readiness to Change criteria: NA  Dimension 5:  Relapse, Continued use, or Continued Problem Potential:  Dimension 5:  Relapse, continued use, or continued problem potential critiera description: NA  Dimension 6:  Recovery/Living Environment:  Dimension 6:  Recovery/Iiving environment criteria description: NA  ASAM Severity Score: ASAM's Severity Rating Score: 0  ASAM Recommended Level of Treatment: ASAM Recommended Level of Treatment:  (n/a)   Substance use Disorder (SUD) Substance Use Disorder (SUD)   Checklist Symptoms of Substance Use:  (Pt denies substance use)  Recommendations for Services/Supports/Treatments: Recommendations for Services/Supports/Treatments Recommendations For Services/Supports/Treatments: Medication Management, ACCTT (Assertive Community Treatment), Other (Comment), Inpatient Hospitalization, Day Treatment  Discharge Disposition:    DSM5 Diagnoses: Patient Active Problem List   Diagnosis Date Noted   Agitation 09/28/2022   Suicidal ideations 01/04/2022   Suicidal overdose (HCC) 03/23/2021   Syphilis 07/15/2020   Malingering 06/05/2020   Gastroesophageal reflux disease 05/04/2020   Hyperglycemia due to type 2 diabetes mellitus (HCC) 05/04/2020   Long term (current) use of insulin (HCC) 05/04/2020   Migraine without aura 05/04/2020   Morbid obesity (HCC) 05/04/2020   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 2 diabetes mellitus (HCC) 05/04/2020   Prolapsed internal hemorrhoids 05/04/2020   Vitamin D deficiency 05/04/2020   Other symptoms and signs involving cognitive functions and awareness 05/04/2020   Suicide attempt (HCC)    Anxiety state 03/06/2020   Schizophrenia (HCC) 09/13/2019   Bipolar I disorder, most recent episode depressed (HCC) 06/23/2019   MDD (major depressive disorder) 10/10/2018   Schizoaffective disorder, bipolar type (HCC) 09/25/2018   Affective psychosis, bipolar (HCC) 06/13/2018   HTN (hypertension) 05/03/2018   Tobacco use disorder 05/03/2018   Adjustment disorder with emotional disturbance 01/02/2018   Schizophrenia, 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   Moderate bipolar I disorder, most recent episode depressed (HCC)    Schizoaffective disorder (HCC)    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/2017   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   OCD (obsessive compulsive disorder) 10/05/2016   Major depressive disorder, recurrent episode, mild (HCC) 05/04/2016   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/2015   Diabetes mellitus (HCC) 07/18/2015   MDD (major depressive disorder), recurrent, severe, 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

## 2023-01-03 DIAGNOSIS — R45851 Suicidal ideations: Secondary | ICD-10-CM | POA: Diagnosis not present

## 2023-01-03 LAB — COMPREHENSIVE METABOLIC PANEL
ALT: 33 U/L (ref 0–44)
AST: 27 U/L (ref 15–41)
Albumin: 3.8 g/dL (ref 3.5–5.0)
Alkaline Phosphatase: 67 U/L (ref 38–126)
Anion gap: 7 (ref 5–15)
BUN: <5 mg/dL — ABNORMAL LOW (ref 6–20)
CO2: 27 mmol/L (ref 22–32)
Calcium: 8.8 mg/dL — ABNORMAL LOW (ref 8.9–10.3)
Chloride: 105 mmol/L (ref 98–111)
Creatinine, Ser: 0.83 mg/dL (ref 0.44–1.00)
GFR, Estimated: >60 mL/min (ref >60)
Glucose, Bld: 205 mg/dL — ABNORMAL HIGH (ref 70–99)
Potassium: 3.5 mmol/L (ref 3.5–5.1)
Sodium: 139 mmol/L (ref 135–145)
Total Bilirubin: 0.6 mg/dL (ref 0.3–1.2)
Total Protein: 7.3 g/dL (ref 6.5–8.1)

## 2023-01-03 LAB — CBC WITH DIFFERENTIAL/PLATELET
Abs Immature Granulocytes: 0.04 10*3/uL (ref 0.00–0.07)
Basophils Absolute: 0.1 10*3/uL (ref 0.0–0.1)
Basophils Relative: 1 %
Eosinophils Absolute: 0.4 10*3/uL (ref 0.0–0.5)
Eosinophils Relative: 3 %
HCT: 36.4 % (ref 36.0–46.0)
Hemoglobin: 11.3 g/dL — ABNORMAL LOW (ref 12.0–15.0)
Immature Granulocytes: 0 %
Lymphocytes Relative: 33 %
Lymphs Abs: 3.5 10*3/uL (ref 0.7–4.0)
MCH: 23.9 pg — ABNORMAL LOW (ref 26.0–34.0)
MCHC: 31 g/dL (ref 30.0–36.0)
MCV: 77.1 fL — ABNORMAL LOW (ref 80.0–100.0)
Monocytes Absolute: 0.5 10*3/uL (ref 0.1–1.0)
Monocytes Relative: 5 %
Neutro Abs: 6 10*3/uL (ref 1.7–7.7)
Neutrophils Relative %: 58 %
Platelets: 304 10*3/uL (ref 150–400)
RBC: 4.72 MIL/uL (ref 3.87–5.11)
RDW: 15.4 % (ref 11.5–15.5)
WBC: 10.4 10*3/uL (ref 4.0–10.5)
nRBC: 0 % (ref 0.0–0.2)

## 2023-01-03 LAB — LITHIUM LEVEL: Lithium Lvl: 0.25 mmol/L — ABNORMAL LOW (ref 0.60–1.20)

## 2023-01-03 LAB — GLUCOSE, CAPILLARY: Glucose-Capillary: 193 mg/dL — ABNORMAL HIGH (ref 70–99)

## 2023-01-03 MED ORDER — BUSPIRONE HCL 10 MG PO TABS: 10.0000 mg | ORAL_TABLET | Freq: Three times a day (TID) | ORAL | 0 refills | Status: DC

## 2023-01-03 NOTE — ED Notes (Signed)
Patient observed/assessed in bed/chair resting quietly appearing in no distress and verbalizing no complaints at this time. Will continue to monitor.  

## 2023-01-03 NOTE — ED Notes (Signed)
BG 199 per MHT, patient requested to wait to take her insulin and PO medication at this time. [facility]HT drawing blood per orders at this time.

## 2023-01-03 NOTE — Discharge Instructions (Addendum)
Discharge recommendations:  Patient is to take medications as prescribed. Please follow-up with your established ACTT team for psychiatry and therapy. Please follow up with your primary care provider for all medical related needs.    Therapy: We recommend that patient participate in individual therapy to address mental health concerns.   Medications: The patient is to contact a medical professional and/or outpatient provider to address any new side effects that develop. The patient should update outpatient providers of any new medications and/or medication changes.    Safety:  The patient should abstain from use of illicit substances/drugs and abuse of any medications. If symptoms worsen or do not continue to improve or if the patient becomes actively suicidal or homicidal then it is recommended that the patient return to the closest hospital emergency department, the [facility], or call 911 for further evaluation and treatment. National Suicide Prevention Lifeline 1-800-SUICIDE or 332 654 1339.   About 988 988 offers 24/7 access to trained crisis counselors who can help people experiencing mental health-related distress. People can call or text 988 or chat 988lifeline.org for themselves or if they are worried about a loved one who may need crisis support.   Crisis Mobile: [facility]:                     306-523-3178 (for crisis response 24 hours a day) [facility] Hotline:                                            3055261192

## 2023-01-03 NOTE — ED Provider Notes (Signed)
FBC/OBS ASAP Discharge Summary  Date and Time: 01/03/2023 8:35 AM  Name: [name]  MRN:  [id number]   Discharge Diagnoses:  Final diagnoses:  Suicidal ideation    Subjective:  [name], 32 y.o., female patient seen face to face by this provider, consulted with Dr. Jannifer Franklin; and chart reviewed on 01/03/23.  On evaluation [name] reports she is feeling "much better."  She says she slept well and is ready to go home.   During evaluation [name] is sitting on a chair in no acute distress.  She is alert, oriented x 4, calm, cooperative and attentive. Her mood is euthymic with congruent affect.  She has normal speech with normal volume and rate and her behavior is normal.  Objectively there is no evidence of psychosis/mania or delusional thinking.  Patient is able to converse coherently, goal directed thoughts, no distractibility, or pre-occupation. She also denies suicidal/self-harm/homicidal ideation, psychosis, and paranoia.  Patient answered questions appropriately.    At this time [name] is educated and verbalizes understanding of mental health resources and other crisis services in the community. She is instructed to call 911 and present to the nearest emergency room should she experience any suicidal/homicidal ideation, auditory/visual/hallucinations, or detrimental worsening of her mental health condition.   Stay Summary: [name] arrived reporting that she had a plan to kill herself by burning her arms with cigarettes; she says "I can take the pain" and repeats this a few times.  She goes on a long, loud and pressured rant about how her boyfriend mistreats her and she can't get him out of her system.  She says her mother is mean to her and encourages her to "pray about it."  The patient states "People want me to loose my mind" but she is unable to articulate why she thinks people want that for her.    Upon admission to the unit, patient was  heard screaming and beating on doors.  She received IM medications.  She calmed down, she slept well and has been cooperative.    Total Time spent with patient: 20 minutes  Past Psychiatric History: Multiple previous stays Past Medical History: Diabetes, HTN, Morbid Obesity, GERD and Migraines Family History: None noted Family Psychiatric History: None noted Social History: Patient lives alone; she has 3 daughters that she does not see Tobacco Cessation:  N/A, patient does not currently use tobacco products  Current Medications:  Current Facility-Administered Medications  Medication Dose Route Frequency Provider Last Rate Last Admin   acetaminophen (TYLENOL) tablet 650 mg  650 mg Oral Q6H PRN Devlyn Parish A, NP       alum & mag hydroxide-simeth (MAALOX/MYLANTA) 200-200-20 MG/5ML suspension 30 mL  30 mL Oral Q4H PRN Emmelina Mcloughlin A, NP       amLODipine (NORVASC) tablet 10 mg  10 mg Oral Daily Kavian Peters A, NP   10 mg at 01/03/23 0819   benztropine (COGENTIN) tablet 0.5 mg  0.5 mg Oral BID Cyncere Sontag A, NP   0.5 mg at 01/03/23 0819   busPIRone (BUSPAR) tablet 10 mg  10 mg Oral TID Phebe Colla A, NP   10 mg at 01/03/23 0820   gabapentin (NEURONTIN) capsule 100 mg  100 mg Oral TID Phebe Colla A, NP   100 mg at 01/03/23 0819   haloperidol (HALDOL) tablet 10 mg  10 mg Oral QHS Dearl Rudden A, NP   10 mg at 01/02/23 2118   insulin  aspart protamine- aspart (NOVOLOG MIX 70/30) injection 20 Units  20 Units Subcutaneous BID WC Demisha Nokes A, NP   20 Units at 01/03/23 0819   lithium carbonate (ESKALITH) ER tablet 450 mg  450 mg Oral QHS Shree Espey A, NP   450 mg at 01/02/23 2118   LORazepam (ATIVAN) tablet 2 mg  2 mg Oral Once Oneta Rack, NP       magnesium hydroxide (MILK OF MAGNESIA) suspension 30 mL  30 mL Oral Daily PRN Kla Bily A, NP       metoprolol tartrate (LOPRESSOR) tablet 25 mg  25 mg Oral BID Haliegh Khurana A, NP   25 mg at 01/03/23 0820   simvastatin (ZOCOR) tablet 40 mg  40 mg Oral  QHS Tamiki Kuba A, NP   40 mg at 01/02/23 2118   traZODone (DESYREL) tablet 50 mg  50 mg Oral QHS Jospeh Mangel A, NP   50 mg at 01/02/23 2118   venlafaxine XR (EFFEXOR-XR) 24 hr capsule 150 mg  150 mg Oral Q breakfast Allayah Raineri A, NP   150 mg at 01/03/23 0820   Current Outpatient Medications  Medication Sig Dispense Refill   amLODipine (NORVASC) 10 MG tablet Take 10 mg by mouth daily.     benzonatate (TESSALON) 100 MG capsule Take 1 capsule (100 mg total) by mouth every 8 (eight) hours. 21 capsule 0   benztropine (COGENTIN) 0.5 MG tablet Take 1 tablet (0.5 mg total) by mouth 2 (two) times daily. For eps 60 tablet 0   Cholecalciferol (VITAMIN D3) 25 MCG (1000 UT) CAPS Take 1,000 Units by mouth daily.     clomiPRAMINE (ANAFRANIL) 25 MG capsule Take 25 mg by mouth at bedtime. Take 1 capsule at bedtime for 3 days then 2 capsules for 3 days then 3 capsules at bedtime     diclofenac Sodium (VOLTAREN) 1 % GEL Apply 2 g topically 4 (four) times daily. 50 g 0   gabapentin (NEURONTIN) 100 MG capsule Take 1 capsule (100 mg total) by mouth 3 (three) times daily. For agitation 90 capsule 0   haloperidol (HALDOL) 10 MG tablet Take 1 tablet (10 mg total) by mouth at bedtime.     insulin aspart protamine- aspart (NOVOLOG MIX 70/30) (70-30) 100 UNIT/ML injection Inject 20 Units into the skin 2 (two) times daily with a meal.     lithium carbonate (ESKALITH) 450 MG ER tablet Take 1 tablet (450 mg total) by mouth at bedtime. For mood stabilization 30 tablet 0   metoprolol tartrate (LOPRESSOR) 25 MG tablet Take 25 mg by mouth 2 (two) times daily.     ondansetron (ZOFRAN-ODT) 4 MG disintegrating tablet 4mg  ODT q4 hours prn nausea/vomit 20 tablet 0   simvastatin (ZOCOR) 40 MG tablet Take 40 mg by mouth at bedtime.     traZODone (DESYREL) 50 MG tablet Take 1 tablet (50 mg total) by mouth at bedtime as needed for sleep. (Patient not taking: Reported on 07/06/2022) 30 tablet 0   venlafaxine XR (EFFEXOR-XR) 150 MG 24 hr  capsule Take 150 mg by mouth daily with breakfast.      PTA Medications:  Facility Ordered Medications  Medication   LORazepam (ATIVAN) tablet 2 mg   [COMPLETED] ziprasidone (GEODON) injection 20 mg   acetaminophen (TYLENOL) tablet 650 mg   alum & mag hydroxide-simeth (MAALOX/MYLANTA) 200-200-20 MG/5ML suspension 30 mL   magnesium hydroxide (MILK OF MAGNESIA) suspension 30 mL   [COMPLETED] LORazepam (ATIVAN) injection 2 mg  amLODipine (NORVASC) tablet 10 mg   benztropine (COGENTIN) tablet 0.5 mg   gabapentin (NEURONTIN) capsule 100 mg   haloperidol (HALDOL) tablet 10 mg   insulin aspart protamine- aspart (NOVOLOG MIX 70/30) injection 20 Units   lithium carbonate (ESKALITH) ER tablet 450 mg   metoprolol tartrate (LOPRESSOR) tablet 25 mg   venlafaxine XR (EFFEXOR-XR) 24 hr capsule 150 mg   simvastatin (ZOCOR) tablet 40 mg   traZODone (DESYREL) tablet 50 mg   busPIRone (BUSPAR) tablet 10 mg   PTA Medications  Medication Sig   metoprolol tartrate (LOPRESSOR) 25 MG tablet Take 25 mg by mouth 2 (two) times daily.   Cholecalciferol (VITAMIN D3) 25 MCG (1000 UT) CAPS Take 1,000 Units by mouth daily.   simvastatin (ZOCOR) 40 MG tablet Take 40 mg by mouth at bedtime.   amLODipine (NORVASC) 10 MG tablet Take 10 mg by mouth daily.   insulin aspart protamine- aspart (NOVOLOG MIX 70/30) (70-30) 100 UNIT/ML injection Inject 20 Units into the skin 2 (two) times daily with a meal.   traZODone (DESYREL) 50 MG tablet Take 1 tablet (50 mg total) by mouth at bedtime as needed for sleep. (Patient not taking: Reported on 07/06/2022)   lithium carbonate (ESKALITH) 450 MG ER tablet Take 1 tablet (450 mg total) by mouth at bedtime. For mood stabilization   benztropine (COGENTIN) 0.5 MG tablet Take 1 tablet (0.5 mg total) by mouth 2 (two) times daily. For eps   gabapentin (NEURONTIN) 100 MG capsule Take 1 capsule (100 mg total) by mouth 3 (three) times daily. For agitation   diclofenac Sodium (VOLTAREN) 1 %  GEL Apply 2 g topically 4 (four) times daily.   venlafaxine XR (EFFEXOR-XR) 150 MG 24 hr capsule Take 150 mg by mouth daily with breakfast.   clomiPRAMINE (ANAFRANIL) 25 MG capsule Take 25 mg by mouth at bedtime. Take 1 capsule at bedtime for 3 days then 2 capsules for 3 days then 3 capsules at bedtime   haloperidol (HALDOL) 10 MG tablet Take 1 tablet (10 mg total) by mouth at bedtime.   benzonatate (TESSALON) 100 MG capsule Take 1 capsule (100 mg total) by mouth every 8 (eight) hours.   ondansetron (ZOFRAN-ODT) 4 MG disintegrating tablet 4mg  ODT q4 hours prn nausea/vomit       07/03/2022    1:22 AM 04/09/2022    8:58 PM 01/10/2022    6:13 PM  Depression screen PHQ 2/9  Decreased Interest 1 1 2   Down, Depressed, Hopeless 1 1 2   PHQ - 2 Score 2 2 4   Altered sleeping 2 2 2   Tired, decreased energy 2 2 1   Change in appetite 1 1 2   Feeling bad or failure about yourself  2 2 2   Trouble concentrating 1 1 2   Moving slowly or fidgety/restless 2 1 2   Suicidal thoughts 1 1 2   PHQ-9 Score 13 12 17   Difficult doing work/chores Somewhat difficult Somewhat difficult Very difficult    Flowsheet Row ED from 01/02/2023 in [facility] ED from 12/19/2022 in [facility] at [facility] ED from 12/16/2022 in [facility]  C-SSRS RISK CATEGORY High Risk No Risk Low Risk       Musculoskeletal  Strength & Muscle Tone: within normal limits Gait & Station: normal Patient leans: N/A  Psychiatric Specialty Exam  Presentation  General Appearance:  Appropriate for Environment  Eye Contact: Good  Speech: Clear and Coherent  Speech Volume: Normal  Handedness:  Right   Mood and Affect  Mood: Euthymic  Affect: Congruent   Thought Process  Thought Processes: Coherent  Descriptions of Associations:Intact  Orientation:Full (Time, Place and Person)  Thought Content:WDL  Diagnosis of Schizophrenia or  Schizoaffective disorder in past: Yes  Duration of Psychotic Symptoms: Greater than six months   Hallucinations:Hallucinations: None  Ideas of Reference:None  Suicidal Thoughts:Suicidal Thoughts: No SI Active Intent and/or Plan: With Intent; With Plan  Homicidal Thoughts:Homicidal Thoughts: No   Sensorium  Memory: Immediate Fair; Recent Fair; Remote Fair  Judgment: Poor  Insight: Poor   Executive Functions  Concentration: Good  Attention Span: Fair  Recall: Fair  Fund of Knowledge: Fair  Language: Fair   Psychomotor Activity  Psychomotor Activity: Psychomotor Activity: Normal   Assets  Assets: Desire for Improvement; Leisure Time   Sleep  Sleep: Sleep: Good Number of Hours of Sleep: 8   No data recorded  Physical Exam  Physical Exam Vitals and nursing note reviewed.  Constitutional:      Appearance: She is obese.  Eyes:     Pupils: Pupils are equal, round, and reactive to light.  Pulmonary:     Effort: Pulmonary effort is normal.  Skin:    General: Skin is dry.  Neurological:     Mental Status: She is alert and oriented to person, place, and time.    Review of Systems  Psychiatric/Behavioral:         Denies all psychiatric symptoms this morning  All other systems reviewed and are negative.  Blood pressure 138/88, pulse 74, temperature 97.7 F (36.5 C), temperature source Oral, resp. rate 18, SpO2 95%. There is no height or weight on file to calculate BMI.  Demographic Factors:  Living alone and Unemployed  Loss Factors: NA  Historical Factors: Impulsivity  Risk Reduction Factors:   NA  Continued Clinical Symptoms:  Previous Psychiatric Diagnoses and Treatments Medical Diagnoses and Treatments/Surgeries  Cognitive Features That Contribute To Risk:  None    Suicide Risk:  Minimal: No identifiable suicidal ideation.  Patients presenting with no risk factors but with morbid ruminations; may be classified as minimal risk  based on the severity of the depressive symptoms  Plan Of Care/Follow-up recommendations:   Discharge recommendations:  Patient is to take medications as prescribed. Please follow-up with your established appointment forpsychiatry and therapy. Please follow up with your primary care provider for all medical related needs.   Therapy: We recommend that patient participate in individual therapy to address mental health concerns.  Medications: The patient is to contact a medical professional and/or outpatient provider to address any new side effects that develop. The patient should update outpatient providers of any new medications and/or medication changes.   Safety:  The patient should abstain from use of illicit substances/drugs and abuse of any medications. If symptoms worsen or do not continue to improve or if the patient becomes actively suicidal or homicidal then it is recommended that the patient return to the closest hospital emergency department, the [facility], or call 911 for further evaluation and treatment. National Suicide Prevention Lifeline 1-800-SUICIDE or 518-740-5826.  About 988 988 offers 24/7 access to trained crisis counselors who can help people experiencing mental health-related distress. People can call or text 988 or chat 988lifeline.org for themselves or if they are worried about a loved one who may need crisis support.  Crisis Mobile: [facility]:  (757)480-0926 (for crisis response 24 hours a day) [facility] Hotline:                                            939-667-0960  Disposition: Discharge home to self care  Thomes Lolling, NP 01/03/2023, 8:35 AM

## 2023-01-03 NOTE — ED Notes (Signed)
Patient was provided breakfast

## 2023-01-03 NOTE — ED Notes (Signed)
Patient discharged with AVS and instructions to follow up with her ACTT team. Pt contracts for safety on unit.

## 2023-01-04 LAB — GLUCOSE, CAPILLARY: Glucose-Capillary: 203 mg/dL — ABNORMAL HIGH (ref 70–99)

## 2023-01-05 NOTE — Progress Notes (Signed)
   01/05/23 2346  [facility] Triage Screening (Walk-ins at [facility] only)  How Did You Hear About [place]? Self  What Is the Reason for Your Visit/Call Today? Pt presents to [facility] voluntarily, unaccompanied with complaint of suicidal ideation with a plan to overdose on her medication. Pt reports that constant arguing with her mother and boyfriend are immediate stressors. Pt continues to work with ACTT team with [facility]. Pt states " I want to move to ,but they are not taking me serious". Pt denies HI,AVH and substance/alcohol use.  How Long Has This Been Causing You Problems? <Week  Have You Recently Had Any Thoughts About Hurting Yourself? Yes  How long ago did you have thoughts about hurting yourself? currently  Are You Planning to Commit Suicide/Harm Yourself At This time? Yes  Have you Recently Had Thoughts About Hurting Someone [name]? No  Are You Planning To Harm Someone At This Time? No  Explanation: pt denies  Are you currently experiencing any auditory, visual or other hallucinations? No  Please explain the hallucinations you are currently experiencing: pt denies  Have You Used Any Alcohol or Drugs in the Past 24 Hours? No  How long ago did you use Drugs or Alcohol? pt denies  Do you have any current medical co-morbidities that require immediate attention? No  Clinician description of patient physical appearance/behavior: calm, cooperative  What Do You Feel Would Help You the Most Today? Treatment for Depression or other mood problem  If access to [facility] was not available, would you have sought care in the Emergency Department? No  Determination of Need Urgent (48 hours)  Options For Referral Other: Comment;BH Urgent Care;Outpatient Therapy;Medication Management;Inpatient Hospitalization

## 2023-01-06 ENCOUNTER — Ambulatory Visit (HOSPITAL_COMMUNITY)
Admission: EM | Admit: 2023-01-06 | Discharge: 2023-01-06 | Disposition: A | Discharge status: Home / Self Care | Payer: 59 | Attending: Psychiatry | Admitting: Psychiatry

## 2023-01-06 ENCOUNTER — Other Ambulatory Visit: Payer: Self-pay

## 2023-01-06 DIAGNOSIS — F250 Schizoaffective disorder, bipolar type: Secondary | ICD-10-CM | POA: Diagnosis not present

## 2023-01-06 DIAGNOSIS — R45851 Suicidal ideations: Secondary | ICD-10-CM | POA: Diagnosis not present

## 2023-01-06 DIAGNOSIS — F209 Schizophrenia, unspecified: Secondary | ICD-10-CM | POA: Diagnosis not present

## 2023-01-06 LAB — POCT URINE DRUG SCREEN - MANUAL ENTRY (I-SCREEN)
POC Amphetamine UR: NOT DETECTED
POC Buprenorphine (BUP): NOT DETECTED
POC Cocaine UR: NOT DETECTED
POC Marijuana UR: NOT DETECTED
POC Methadone UR: NOT DETECTED
POC Methamphetamine UR: NOT DETECTED
POC Morphine: NOT DETECTED
POC Oxazepam (BZO): NOT DETECTED
POC Oxycodone UR: NOT DETECTED
POC Secobarbital (BAR): NOT DETECTED

## 2023-01-06 LAB — POC URINE PREG, ED: Preg Test, Ur: NEGATIVE

## 2023-01-06 LAB — GLUCOSE, CAPILLARY: Glucose-Capillary: 179 mg/dL — ABNORMAL HIGH (ref 70–99)

## 2023-01-06 MED ORDER — OLANZAPINE 10 MG PO TBDP: 10.0000 mg | ORAL_TABLET | Freq: Three times a day (TID) | ORAL | Status: DC | PRN

## 2023-01-06 MED ORDER — VENLAFAXINE HCL ER 150 MG PO CP24
150.0000 mg | ORAL_CAPSULE | Freq: Every day | ORAL | Status: DC
  Administered 2023-01-06: 150 mg via ORAL
  Filled 2023-01-06: qty 1

## 2023-01-06 MED ORDER — ZIPRASIDONE MESYLATE 20 MG IM SOLR: 20.0000 mg | INTRAMUSCULAR | Status: DC | PRN

## 2023-01-06 MED ORDER — ACETAMINOPHEN 325 MG PO TABS: 650.0000 mg | ORAL_TABLET | Freq: Four times a day (QID) | ORAL | Status: DC | PRN

## 2023-01-06 MED ORDER — MAGNESIUM HYDROXIDE 400 MG/5ML PO SUSP: 30.0000 mL | Freq: Every day | ORAL | Status: DC | PRN

## 2023-01-06 MED ORDER — METOPROLOL TARTRATE 25 MG PO TABS
25.0000 mg | ORAL_TABLET | Freq: Two times a day (BID) | ORAL | Status: DC
  Administered 2023-01-06: 25 mg via ORAL
  Filled 2023-01-06: qty 1

## 2023-01-06 MED ORDER — TRAZODONE HCL 50 MG PO TABS: 50.0000 mg | ORAL_TABLET | Freq: Every evening | ORAL | Status: DC | PRN

## 2023-01-06 MED ORDER — LITHIUM CARBONATE ER 450 MG PO TBCR: 450.0000 mg | EXTENDED_RELEASE_TABLET | Freq: Every day | ORAL | Status: DC

## 2023-01-06 MED ORDER — AMLODIPINE BESYLATE 10 MG PO TABS
10.0000 mg | ORAL_TABLET | Freq: Every day | ORAL | Status: DC
  Administered 2023-01-06: 10 mg via ORAL
  Filled 2023-01-06: qty 1

## 2023-01-06 MED ORDER — GABAPENTIN 100 MG PO CAPS
100.0000 mg | ORAL_CAPSULE | Freq: Three times a day (TID) | ORAL | Status: DC
  Administered 2023-01-06: 100 mg via ORAL
  Filled 2023-01-06: qty 1

## 2023-01-06 MED ORDER — SIMVASTATIN 20 MG PO TABS: 40.0000 mg | ORAL_TABLET | Freq: Every day | ORAL | Status: DC

## 2023-01-06 MED ORDER — ALUM & MAG HYDROXIDE-SIMETH 200-200-20 MG/5ML PO SUSP: 30.0000 mL | ORAL | Status: DC | PRN

## 2023-01-06 MED ORDER — BUSPIRONE HCL 10 MG PO TABS
10.0000 mg | ORAL_TABLET | Freq: Three times a day (TID) | ORAL | Status: DC
  Administered 2023-01-06: 10 mg via ORAL
  Filled 2023-01-06: qty 1

## 2023-01-06 MED ORDER — BENZTROPINE MESYLATE 0.5 MG PO TABS
0.5000 mg | ORAL_TABLET | Freq: Two times a day (BID) | ORAL | Status: DC
  Administered 2023-01-06: 0.5 mg via ORAL
  Filled 2023-01-06: qty 1

## 2023-01-06 MED ORDER — LORAZEPAM 1 MG PO TABS
1.0000 mg | ORAL_TABLET | ORAL | Status: AC | PRN
  Administered 2023-01-06: 1 mg via ORAL
  Filled 2023-01-06: qty 1

## 2023-01-06 MED ORDER — HALOPERIDOL 5 MG PO TABS: 10.0000 mg | ORAL_TABLET | Freq: Every day | ORAL | Status: DC

## 2023-01-06 MED ORDER — INSULIN ASPART PROT & ASPART (70-30 MIX) 100 UNIT/ML ~~LOC~~ SUSP: 20.0000 [IU] | Freq: Two times a day (BID) | SUBCUTANEOUS | Status: DC

## 2023-01-06 NOTE — ED Notes (Signed)
Pt sleeping@this time breathing even and unlabored will continue to monitor for safety 

## 2023-01-06 NOTE — ED Notes (Signed)
Pt A&O x 4, presents with suicidal ideation, plan to overdose on her medication.  Denies HI or AVH.  Comfort measures given.  Monitoring for safety.

## 2023-01-06 NOTE — ED Provider Notes (Signed)
[facility] Continuous Assessment Admission H&P  Date: 01/06/23 Patient Name: [name] MRN: [id number] Chief Complaint: plan to OD on her medications   Diagnoses:  Final diagnoses:  Suicidal ideation  Schizoaffective disorder, bipolar type [facility])    HPI: [name], 32 y/o female with a of schizoaffective disorder,  MDD, GAD, SI, OD, present to [facility],  report she is having suicidal thoughts to OD on her medications.  According to the patient my medication is not working.  Per the patient she says her ACT team today and she told them but they did not do anything.  A review of patient records show multiple ED visits for similar complaints.  Patient is followed by an ACT team.  Patient lives alone and unemployed.  Patient keeps complaining that she wants to move to [facility] but the ACT team is not doing anything to get her there.  Face-to-face observation of patient, patient is alert and oriented x 4, speech is clear, maintaining eye contact.  Patient endorsed suicidal ideation with plans to OD on her medications.  Patient denies HI, AVH or paranoia at this time.  Patient reports she smoked marijuana every now and then the last time was a week ago patient denies alcohol use or any other illicit drug use at this time patient does not seem to be influenced by external stimuli.   Recommend inpatient observation  Total Time spent with patient: 20 minutes  Musculoskeletal  Strength & Muscle Tone: within normal limits Gait & Station: normal Patient leans: N/A  Psychiatric Specialty Exam  Presentation General Appearance:  Casual  Eye Contact: Good  Speech: Clear and Coherent  Speech Volume: Normal  Handedness: Right   Mood and Affect  Mood: Anxious; Angry  Affect: Appropriate   Thought Process  Thought Processes: Coherent  Descriptions of Associations:Intact  Orientation:Full (Time, Place and Person)  Thought Content:WDL  Diagnosis of Schizophrenia or  Schizoaffective disorder in past: No  Duration of Psychotic Symptoms: N/A  Hallucinations:Hallucinations: None  Ideas of Reference:None  Suicidal Thoughts:Suicidal Thoughts: Yes, Passive SI Active Intent and/or Plan: With Intent; With Plan  Homicidal Thoughts:Homicidal Thoughts: No   Sensorium  Memory: Immediate Fair  Judgment: Poor  Insight: Poor   Executive Functions  Concentration: Good  Attention Span: Fair  Recall: Fair  Fund of Knowledge: Fair  Language: Fair   Psychomotor Activity  Psychomotor Activity: Psychomotor Activity: Normal   Assets  Assets: Desire for Improvement   Sleep  Sleep: Sleep: Fair Number of Hours of Sleep: 8   Nutritional Assessment (For OBS and FBC admissions only) Has the patient had a weight loss or gain of 10 pounds or more in the last 3 months?: No Has the patient had a decrease in food intake/or appetite?: No Does the patient have dental problems?: No Does the patient have eating habits or behaviors that may be indicators of an eating disorder including binging or inducing vomiting?: No Has the patient recently lost weight without trying?: 0 Has the patient been eating poorly because of a decreased appetite?: 0 Malnutrition Screening Tool Score: 0    Physical Exam HENT:     Head: Normocephalic.     Nose: Nose normal.  Eyes:     Pupils: Pupils are equal, round, and reactive to light.  Cardiovascular:     Rate and Rhythm: Normal rate.  Pulmonary:     Effort: Pulmonary effort is normal.  Musculoskeletal:        General: Normal range of motion.  Cervical back: Normal range of motion.  Neurological:     General: No focal deficit present.     Mental Status: She is alert.  Psychiatric:        Mood and Affect: Mood normal.        Behavior: Behavior normal.        Thought Content: Thought content normal.        Judgment: Judgment normal.    Review of Systems  Constitutional: Negative.   HENT:  Negative.    Eyes: Negative.   Respiratory: Negative.    Cardiovascular: Negative.   Gastrointestinal: Negative.   Genitourinary: Negative.   Musculoskeletal: Negative.   Skin: Negative.   Neurological: Negative.   Psychiatric/Behavioral:  Positive for suicidal ideas. The patient is nervous/anxious.     Blood pressure 122/77, pulse 85, temperature 97.9 F (36.6 C), temperature source Oral, resp. rate 20, SpO2 99%. There is no height or weight on file to calculate BMI.  Past Psychiatric History: Schizoaffective, OD, SI, GAD, MDD   Is the patient at risk to self? Yes  Has the patient been a risk to self in the past 6 months? Yes .    Has the patient been a risk to self within the distant past? Yes   Is the patient a risk to others? No   Has the patient been a risk to others in the past 6 months? No   Has the patient been a risk to others within the distant past? No   Past Medical History: see chart  Family History: unknown  Social History: marijuana  Last Labs:  Admission on 01/06/2023  Component Date Value Ref Range Status   POC Amphetamine UR 01/06/2023 None Detected  NONE DETECTED (Cut Off Level 1000 ng/mL) Final   POC Secobarbital (BAR) 01/06/2023 None Detected  NONE DETECTED (Cut Off Level 300 ng/mL) Final   POC Buprenorphine (BUP) 01/06/2023 None Detected  NONE DETECTED (Cut Off Level 10 ng/mL) Final   POC Oxazepam (BZO) 01/06/2023 None Detected  NONE DETECTED (Cut Off Level 300 ng/mL) Final   POC Cocaine UR 01/06/2023 None Detected  NONE DETECTED (Cut Off Level 300 ng/mL) Final   POC Methamphetamine UR 01/06/2023 None Detected  NONE DETECTED (Cut Off Level 1000 ng/mL) Final   POC Morphine 01/06/2023 None Detected  NONE DETECTED (Cut Off Level 300 ng/mL) Final   POC Methadone UR 01/06/2023 None Detected  NONE DETECTED (Cut Off Level 300 ng/mL) Final   POC Oxycodone UR 01/06/2023 None Detected  NONE DETECTED (Cut Off Level 100 ng/mL) Final   POC Marijuana UR 01/06/2023  None Detected  NONE DETECTED (Cut Off Level 50 ng/mL) Final   Preg Test, Ur 01/06/2023 Negative  Negative Final  Admission on 01/02/2023, Discharged on 01/03/2023  Component Date Value Ref Range Status   WBC 01/03/2023 10.4  4.0 - 10.5 K/uL Final   RBC 01/03/2023 4.72  3.87 - 5.11 MIL/uL Final   Hemoglobin 01/03/2023 11.3 (L)  12.0 - 15.0 g/dL Final   HCT 16/01/9603 36.4  36.0 - 46.0 % Final   MCV 01/03/2023 77.1 (L)  80.0 - 100.0 fL Final   MCH 01/03/2023 23.9 (L)  26.0 - 34.0 pg Final   MCHC 01/03/2023 31.0  30.0 - 36.0 g/dL Final   RDW 54/12/8117 15.4  11.5 - 15.5 % Final   Platelets 01/03/2023 304  150 - 400 K/uL Final   nRBC 01/03/2023 0.0  0.0 - 0.2 % Final   Neutrophils Relative % 01/03/2023 58  %  Final   Neutro Abs 01/03/2023 6.0  1.7 - 7.7 K/uL Final   Lymphocytes Relative 01/03/2023 33  % Final   Lymphs Abs 01/03/2023 3.5  0.7 - 4.0 K/uL Final   Monocytes Relative 01/03/2023 5  % Final   Monocytes Absolute 01/03/2023 0.5  0.1 - 1.0 K/uL Final   Eosinophils Relative 01/03/2023 3  % Final   Eosinophils Absolute 01/03/2023 0.4  0.0 - 0.5 K/uL Final   Basophils Relative 01/03/2023 1  % Final   Basophils Absolute 01/03/2023 0.1  0.0 - 0.1 K/uL Final   Immature Granulocytes 01/03/2023 0  % Final   Abs Immature Granulocytes 01/03/2023 0.04  0.00 - 0.07 K/uL Final   Performed at [facility], [address] [place], [place] [address]   Sodium 01/03/2023 139  135 - 145 mmol/L Final   Potassium 01/03/2023 3.5  3.5 - 5.1 mmol/L Final   Chloride 01/03/2023 105  98 - 111 mmol/L Final   CO2 01/03/2023 27  22 - 32 mmol/L Final   Glucose, Bld 01/03/2023 205 (H)  70 - 99 mg/dL Final   Glucose reference range applies only to samples taken after fasting for at least 8 hours.   BUN 01/03/2023 <5 (L)  6 - 20 mg/dL Final   Creatinine, Ser 01/03/2023 0.83  0.44 - 1.00 mg/dL Final   Calcium 60/45/4098 8.8 (L)  8.9 - 10.3 mg/dL Final   Total Protein 11/91/4782 7.3  6.5 - 8.1 g/dL Final    Albumin 95/62/1308 3.8  3.5 - 5.0 g/dL Final   AST 65/78/4696 27  15 - 41 U/L Final   ALT 01/03/2023 33  0 - 44 U/L Final   Alkaline Phosphatase 01/03/2023 67  38 - 126 U/L Final   Total Bilirubin 01/03/2023 0.6  0.3 - 1.2 mg/dL Final   GFR, Estimated 01/03/2023 >60  >60 mL/min Final   Comment: (NOTE) Calculated using the CKD-EPI Creatinine Equation (2021)    Anion gap 01/03/2023 7  5 - 15 Final   Performed at [facility], [address], [place], [place] [address]   Lithium Lvl 01/03/2023 0.25 (L)  0.60 - 1.20 mmol/L Final   Performed at [facility], [address], [place], [place] [address]   Glucose-Capillary 01/03/2023 193 (H)  70 - 99 mg/dL Final   Glucose reference range applies only to samples taken after fasting for at least 8 hours.   Glucose-Capillary 01/02/2023 203 (H)  70 - 99 mg/dL Final   Glucose reference range applies only to samples taken after fasting for at least 8 hours.  Admission on 12/05/2022, Discharged on 12/06/2022  Component Date Value Ref Range Status   Glucose-Capillary 12/06/2022 196 (H)  70 - 99 mg/dL Final   Glucose reference range applies only to samples taken after fasting for at least 8 hours.   Sodium 12/05/2022 135  135 - 145 mmol/L Final   Potassium 12/05/2022 3.2 (L)  3.5 - 5.1 mmol/L Final   Chloride 12/05/2022 102  98 - 111 mmol/L Final   CO2 12/05/2022 24  22 - 32 mmol/L Final   Glucose, Bld 12/05/2022 189 (H)  70 - 99 mg/dL Final   Glucose reference range applies only to samples taken after fasting for at least 8 hours.   BUN 12/05/2022 13  6 - 20 mg/dL Final   Creatinine, Ser 12/05/2022 0.87  0.44 - 1.00 mg/dL Final   Calcium 40/01/2724 9.3  8.9 - 10.3 mg/dL Final  Total Protein 12/05/2022 7.9  6.5 - 8.1 g/dL Final   Albumin 16/01/9603 4.4  3.5 - 5.0 g/dL Final   AST 54/12/8117 33  15 - 41 U/L Final   ALT 12/05/2022 32  0 - 44 U/L Final   Alkaline Phosphatase 12/05/2022 67  38 - 126 U/L Final   Total Bilirubin 12/05/2022  0.6  0.3 - 1.2 mg/dL Final   GFR, Estimated 12/05/2022 >60  >60 mL/min Final   Comment: (NOTE) Calculated using the CKD-EPI Creatinine Equation (2021)    Anion gap 12/05/2022 9  5 - 15 Final   Performed at [facility], [address], [place], [place] [address]   Opiates 12/06/2022 NONE DETECTED  NONE DETECTED Final   Cocaine 12/06/2022 NONE DETECTED  NONE DETECTED Final   Benzodiazepines 12/06/2022 NONE DETECTED  NONE DETECTED Final   Amphetamines 12/06/2022 NONE DETECTED  NONE DETECTED Final   Tetrahydrocannabinol 12/06/2022 NONE DETECTED  NONE DETECTED Final   Barbiturates 12/06/2022 NONE DETECTED  NONE DETECTED Final   Comment: (NOTE) DRUG SCREEN FOR MEDICAL PURPOSES ONLY.  IF CONFIRMATION IS NEEDED FOR ANY PURPOSE, NOTIFY LAB WITHIN 5 DAYS.  LOWEST DETECTABLE LIMITS FOR URINE DRUG SCREEN Drug Class                     Cutoff (ng/mL) Amphetamine and metabolites    1000 Barbiturate and metabolites    200 Benzodiazepine                 200 Opiates and metabolites        300 Cocaine and metabolites        300 THC                            50 Performed at [facility], [address] [place], [place] [address]    WBC 12/05/2022 15.6 (H)  4.0 - 10.5 K/uL Final   RBC 12/05/2022 4.86  3.87 - 5.11 MIL/uL Final   Hemoglobin 12/05/2022 11.9 (L)  12.0 - 15.0 g/dL Final   HCT 30/86/5784 38.0  36.0 - 46.0 % Final   MCV 12/05/2022 78.2 (L)  80.0 - 100.0 fL Final   MCH 12/05/2022 24.5 (L)  26.0 - 34.0 pg Final   MCHC 12/05/2022 31.3  30.0 - 36.0 g/dL Final   RDW 69/62/9528 16.0 (H)  11.5 - 15.5 % Final   Platelets 12/05/2022 286  150 - 400 K/uL Final   nRBC 12/05/2022 0.0  0.0 - 0.2 % Final   Neutrophils Relative % 12/05/2022 56  % Final   Neutro Abs 12/05/2022 8.8 (H)  1.7 - 7.7 K/uL Final   Lymphocytes Relative 12/05/2022 34  % Final   Lymphs Abs 12/05/2022 5.3 (H)  0.7 - 4.0 K/uL Final   Monocytes Relative 12/05/2022 6  % Final    Monocytes Absolute 12/05/2022 0.9  0.1 - 1.0 K/uL Final   Eosinophils Relative 12/05/2022 3  % Final   Eosinophils Absolute 12/05/2022 0.5  0.0 - 0.5 K/uL Final   Basophils Relative 12/05/2022 1  % Final   Basophils Absolute 12/05/2022 0.1  0.0 - 0.1 K/uL Final   Immature Granulocytes 12/05/2022 0  % Final   Abs Immature Granulocytes 12/05/2022 0.04  0.00 - 0.07 K/uL Final   Performed at [facility], [address], [place], [place] [address]   Preg, Serum 12/05/2022 NEGATIVE  NEGATIVE Final  Comment:        THE SENSITIVITY OF THIS METHODOLOGY IS >10 mIU/mL. Performed at [facility], [address] [place], [place] [address]    Acetaminophen (Tylenol), Serum 12/06/2022 <10 (L)  10 - 30 ug/mL Final   Comment: (NOTE) Therapeutic concentrations vary significantly. A range of 10-30 ug/mL  may be an effective concentration for many patients. However, some  are best treated at concentrations outside of this range. Acetaminophen concentrations >150 ug/mL at 4 hours after ingestion  and >50 ug/mL at 12 hours after ingestion are often associated with  toxic reactions.  Performed at [facility], [address] [place], [place] [address]    Acetaminophen (Tylenol), Serum 12/06/2022 <10 (L)  10 - 30 ug/mL Final   Comment: (NOTE) Therapeutic concentrations vary significantly. A range of 10-30 ug/mL  may be an effective concentration for many patients. However, some  are best treated at concentrations outside of this range. Acetaminophen concentrations >150 ug/mL at 4 hours after ingestion  and >50 ug/mL at 12 hours after ingestion are often associated with  toxic reactions.  Performed at [facility], [address], [place], [place] [address]   Admission on 11/01/2022, Discharged on 11/02/2022  Component Date Value Ref Range Status   Color, Urine 11/02/2022 STRAW (A)  YELLOW Final   APPearance 11/02/2022  CLEAR  CLEAR Final   Specific Gravity, Urine 11/02/2022 1.014  1.005 - 1.030 Final   pH 11/02/2022 5.0  5.0 - 8.0 Final   Glucose, UA 11/02/2022 >=500 (A)  NEGATIVE mg/dL Final   Hgb urine dipstick 11/02/2022 SMALL (A)  NEGATIVE Final   Bilirubin Urine 11/02/2022 NEGATIVE  NEGATIVE Final   Ketones, ur 11/02/2022 NEGATIVE  NEGATIVE mg/dL Final   Protein, ur 91/47/8295 NEGATIVE  NEGATIVE mg/dL Final   Nitrite 62/13/0865 NEGATIVE  NEGATIVE Final   Leukocytes,Ua 11/02/2022 NEGATIVE  NEGATIVE Final   RBC / HPF 11/02/2022 11-20  0 - 5 RBC/hpf Final   WBC, UA 11/02/2022 0-5  0 - 5 WBC/hpf Final   Bacteria, UA 11/02/2022 NONE SEEN  NONE SEEN Final   Squamous Epithelial / HPF 11/02/2022 0-5  0 - 5 /HPF Final   Mucus 11/02/2022 PRESENT   Final   Performed at [facility], [address], [place], [place] [address]   Preg Test, Ur 11/02/2022 NEGATIVE  NEGATIVE Final   Comment:        THE SENSITIVITY OF THIS METHODOLOGY IS >25 mIU/mL. Performed at [facility], [address] [place], [place] [address]    WBC 11/01/2022 10.7 (H)  4.0 - 10.5 K/uL Final   RBC 11/01/2022 4.07  3.87 - 5.11 MIL/uL Final   Hemoglobin 11/01/2022 9.7 (L)  12.0 - 15.0 g/dL Final   HCT 84/13/2440 31.8 (L)  36.0 - 46.0 % Final   MCV 11/01/2022 78.1 (L)  80.0 - 100.0 fL Final   MCH 11/01/2022 23.8 (L)  26.0 - 34.0 pg Final   MCHC 11/01/2022 30.5  30.0 - 36.0 g/dL Final   RDW 02/21/2535 16.3 (H)  11.5 - 15.5 % Final   Platelets 11/01/2022 219  150 - 400 K/uL Final   nRBC 11/01/2022 0.0  0.0 - 0.2 % Final   Neutrophils Relative % 11/01/2022 51  % Final   Neutro Abs 11/01/2022 5.5  1.7 - 7.7 K/uL Final   Lymphocytes Relative 11/01/2022 37  % Final   Lymphs Abs 11/01/2022 3.9  0.7 - 4.0  K/uL Final   Monocytes Relative 11/01/2022 6  % Final   Monocytes Absolute 11/01/2022 0.6  0.1 - 1.0 K/uL Final   Eosinophils Relative 11/01/2022 4  % Final   Eosinophils Absolute 11/01/2022 0.4  0.0  - 0.5 K/uL Final   Basophils Relative 11/01/2022 1  % Final   Basophils Absolute 11/01/2022 0.1  0.0 - 0.1 K/uL Final   Immature Granulocytes 11/01/2022 1  % Final   Abs Immature Granulocytes 11/01/2022 0.15 (H)  0.00 - 0.07 K/uL Final   Performed at [facility], [address] [place], [place] [address]   Sodium 11/01/2022 136  135 - 145 mmol/L Final   Potassium 11/01/2022 3.2 (L)  3.5 - 5.1 mmol/L Final   Chloride 11/01/2022 105  98 - 111 mmol/L Final   CO2 11/01/2022 24  22 - 32 mmol/L Final   Glucose, Bld 11/01/2022 242 (H)  70 - 99 mg/dL Final   Glucose reference range applies only to samples taken after fasting for at least 8 hours.   BUN 11/01/2022 8  6 - 20 mg/dL Final   Creatinine, Ser 11/01/2022 0.93  0.44 - 1.00 mg/dL Final   Calcium 91/47/8295 8.6 (L)  8.9 - 10.3 mg/dL Final   Total Protein 62/13/0865 6.9  6.5 - 8.1 g/dL Final   Albumin 78/46/9629 3.6  3.5 - 5.0 g/dL Final   AST 52/84/1324 27  15 - 41 U/L Final   ALT 11/01/2022 27  0 - 44 U/L Final   Alkaline Phosphatase 11/01/2022 61  38 - 126 U/L Final   Total Bilirubin 11/01/2022 0.4  0.3 - 1.2 mg/dL Final   GFR, Estimated 11/01/2022 >60  >60 mL/min Final   Comment: (NOTE) Calculated using the CKD-EPI Creatinine Equation (2021)    Anion gap 11/01/2022 7  5 - 15 Final   Performed at [facility], [address] [place], [place] [address]   Lipase 11/01/2022 64 (H)  11 - 51 U/L Final   Performed at [facility], [address], [place], [place] [address]   Neisseria Gonorrhea 11/01/2022 Negative   Final   Chlamydia 11/01/2022 Negative   Final   Comment 11/01/2022 Normal Reference Ranger Chlamydia - Negative   Final   Comment 11/01/2022 Normal Reference Range Neisseria Gonorrhea - Negative   Final   Yeast Wet Prep HPF POC 11/02/2022 NONE SEEN  NONE SEEN Final   Trich, Wet Prep 11/02/2022 NONE SEEN  NONE SEEN Final   Clue Cells Wet Prep HPF POC 11/02/2022 NONE  SEEN  NONE SEEN Final   WBC, Wet Prep HPF POC 11/02/2022 <10  <10 Final   Swab received with less than 0.5 mL of saline, saline added to specimen, interpret results with caution.   Sperm 11/02/2022 NONE SEEN   Final   Performed at [facility], [address] [place], [place] [address]  Admission on 09/28/2022, Discharged on 09/29/2022  Component Date Value Ref Range Status   WBC 09/28/2022 9.7  4.0 - 10.5 K/uL Final   RBC 09/28/2022 4.27  3.87 - 5.11 MIL/uL Final   Hemoglobin 09/28/2022 10.1 (L)  12.0 - 15.0 g/dL Final   HCT 47/42/5956 32.4 (L)  36.0 - 46.0 % Final   MCV 09/28/2022 75.9 (L)  80.0 - 100.0 fL Final   MCH 09/28/2022 23.7 (L)  26.0 - 34.0 pg Final   MCHC 09/28/2022 31.2  30.0 - 36.0 g/dL Final   RDW 38/75/6433 17.0 (H)  11.5 - 15.5 % Final   Platelets 09/28/2022 209  150 - 400 K/uL Final   REPEATED TO VERIFY   nRBC 09/28/2022 0.0  0.0 - 0.2 % Final   Neutrophils Relative % 09/28/2022 55  % Final   Neutro Abs 09/28/2022 5.4  1.7 - 7.7 K/uL Final   Lymphocytes Relative 09/28/2022 34  % Final   Lymphs Abs 09/28/2022 3.3  0.7 - 4.0 K/uL Final   Monocytes Relative 09/28/2022 6  % Final   Monocytes Absolute 09/28/2022 0.6  0.1 - 1.0 K/uL Final   Eosinophils Relative 09/28/2022 4  % Final   Eosinophils Absolute 09/28/2022 0.4  0.0 - 0.5 K/uL Final   Basophils Relative 09/28/2022 1  % Final   Basophils Absolute 09/28/2022 0.1  0.0 - 0.1 K/uL Final   Immature Granulocytes 09/28/2022 0  % Final   Abs Immature Granulocytes 09/28/2022 0.04  0.00 - 0.07 K/uL Final   Performed at [facility], [address], [place], [place] [address]   Sodium 09/28/2022 137  135 - 145 mmol/L Final   Potassium 09/28/2022 3.2 (L)  3.5 - 5.1 mmol/L Final   Chloride 09/28/2022 104  98 - 111 mmol/L Final   CO2 09/28/2022 26  22 - 32 mmol/L Final   Glucose, Bld 09/28/2022 125 (H)  70 - 99 mg/dL Final   Glucose reference range applies only to samples taken after fasting for at  least 8 hours.   BUN 09/28/2022 5 (L)  6 - 20 mg/dL Final   Creatinine, Ser 09/28/2022 0.89  0.44 - 1.00 mg/dL Final   Calcium 60/45/4098 8.8 (L)  8.9 - 10.3 mg/dL Final   Total Protein 11/91/4782 6.7  6.5 - 8.1 g/dL Final   Albumin 95/62/1308 3.8  3.5 - 5.0 g/dL Final   AST 65/78/4696 19  15 - 41 U/L Final   ALT 09/28/2022 18  0 - 44 U/L Final   Alkaline Phosphatase 09/28/2022 53  38 - 126 U/L Final   Total Bilirubin 09/28/2022 0.4  0.3 - 1.2 mg/dL Final   GFR, Estimated 09/28/2022 >60  >60 mL/min Final   Comment: (NOTE) Calculated using the CKD-EPI Creatinine Equation (2021)    Anion gap 09/28/2022 7  5 - 15 Final   Performed at [facility], [address] [place], [place] [address]   Hgb A1c MFr Bld 09/28/2022 7.9 (H)  4.8 - 5.6 % Final   Comment: (NOTE)         Prediabetes: 5.7 - 6.4         Diabetes: >6.4         Glycemic control for adults with diabetes: <7.0    Mean Plasma Glucose 09/28/2022 180  mg/dL Final   Comment: (NOTE) Performed At: [facility] [address] [place], [place] [id number] Jolene Schimke MD UV:[id number]    Magnesium 09/28/2022 1.8  1.7 - 2.4 mg/dL Final   Performed at [facility], [address], [place], [place] [address]   Alcohol, Ethyl (B) 09/28/2022 <10  <10 mg/dL Final   Comment: (NOTE) Lowest detectable limit for serum alcohol is 10 mg/dL.  For medical purposes only. Performed at [facility], [address], [place], [place] [address]    Cholesterol 09/28/2022 179  0 - 200 mg/dL Final   Triglycerides 56/43/3295 80  <150 mg/dL Final   HDL 18/84/1660 62  >40 mg/dL Final   Total CHOL/HDL Ratio 09/28/2022 2.9  RATIO Final  VLDL 09/28/2022 16  0 - 40 mg/dL Final   LDL Cholesterol 09/28/2022 101 (H)  0 - 99 mg/dL Final   Comment:        Total Cholesterol/HDL:CHD Risk Coronary Heart Disease Risk Table                     Men   Women  1/2 Average Risk   3.4   3.3  Average Risk       5.0   4.4  2 X Average  Risk   9.6   7.1  3 X Average Risk  23.4   11.0        Use the calculated Patient Ratio above and the CHD Risk Table to determine the patient's CHD Risk.        ATP III CLASSIFICATION (LDL):  <100     mg/dL   Optimal  563-875  mg/dL   Near or Above                    Optimal  130-159  mg/dL   Borderline  643-329  mg/dL   High  >518     mg/dL   Very High Performed at [facility], [address] [place], [place] [address]    TSH 09/28/2022 1.374  0.350 - 4.500 uIU/mL Final   Comment: Performed by a 3rd Generation assay with a functional sensitivity of <=0.01 uIU/mL. Performed at [facility], [address] [place], [place] [address]    Prolactin 09/28/2022 26.0  4.8 - 33.4 ng/mL Final   Comment: (NOTE) Performed At: [facility] [address] [place], [place] [id number] Jolene Schimke MD TD:[id number]    Lithium Lvl 09/28/2022 0.19 (L)  0.60 - 1.20 mmol/L Final   Performed at [facility], [address] [place], [place] [address]   Glucose-Capillary 09/29/2022 171 (H)  70 - 99 mg/dL Final   Glucose reference range applies only to samples taken after fasting for at least 8 hours.  Admission on 07/16/2022, Discharged on 07/18/2022  Component Date Value Ref Range Status   Sodium 07/16/2022 138  135 - 145 mmol/L Final   Potassium 07/16/2022 3.5  3.5 - 5.1 mmol/L Final   Chloride 07/16/2022 104  98 - 111 mmol/L Final   CO2 07/16/2022 28  22 - 32 mmol/L Final   Glucose, Bld 07/16/2022 193 (H)  70 - 99 mg/dL Final   Glucose reference range applies only to samples taken after fasting for at least 8 hours.   BUN 07/16/2022 <5 (L)  6 - 20 mg/dL Final   Creatinine, Ser 07/16/2022 0.85  0.44 - 1.00 mg/dL Final   Calcium 28/31/5176 9.3  8.9 - 10.3 mg/dL Final   Total Protein 16/10/3708 7.6  6.5 - 8.1 g/dL Final   Albumin 62/69/4854 3.9  3.5 - 5.0 g/dL Final   AST 62/70/3500 38  15 - 41 U/L Final   ALT 07/16/2022 40  0 - 44 U/L Final   Alkaline  Phosphatase 07/16/2022 55  38 - 126 U/L Final   Total Bilirubin 07/16/2022 0.5  0.3 - 1.2 mg/dL Final   GFR, Estimated 07/16/2022 >60  >60 mL/min Final   Comment: (NOTE) Calculated using the CKD-EPI Creatinine Equation (2021)    Anion gap 07/16/2022 6  5 - 15 Final   Performed at [facility], [address], [place], [place] [address]   Alcohol, Ethyl (B) 07/16/2022 <10  <  10 mg/dL Final   Comment: (NOTE) Lowest detectable limit for serum alcohol is 10 mg/dL.  For medical purposes only. Performed at [facility], [address], [place], [place] [address]    Opiates 07/16/2022 NONE DETECTED  NONE DETECTED Final   Cocaine 07/16/2022 NONE DETECTED  NONE DETECTED Final   Benzodiazepines 07/16/2022 NONE DETECTED  NONE DETECTED Final   Amphetamines 07/16/2022 NONE DETECTED  NONE DETECTED Final   Tetrahydrocannabinol 07/16/2022 NONE DETECTED  NONE DETECTED Final   Barbiturates 07/16/2022 NONE DETECTED  NONE DETECTED Final   Comment: (NOTE) DRUG SCREEN FOR MEDICAL PURPOSES ONLY.  IF CONFIRMATION IS NEEDED FOR ANY PURPOSE, NOTIFY LAB WITHIN 5 DAYS.  LOWEST DETECTABLE LIMITS FOR URINE DRUG SCREEN Drug Class                     Cutoff (ng/mL) Amphetamine and metabolites    1000 Barbiturate and metabolites    200 Benzodiazepine                 200 Opiates and metabolites        300 Cocaine and metabolites        300 THC                            50 Performed at [facility], [address] [place], [place] [address]    WBC 07/16/2022 10.4  4.0 - 10.5 K/uL Final   RBC 07/16/2022 4.80  3.87 - 5.11 MIL/uL Final   Hemoglobin 07/16/2022 11.4 (L)  12.0 - 15.0 g/dL Final   HCT 13/24/4010 36.9  36.0 - 46.0 % Final   MCV 07/16/2022 76.9 (L)  80.0 - 100.0 fL Final   MCH 07/16/2022 23.8 (L)  26.0 - 34.0 pg Final   MCHC 07/16/2022 30.9  30.0 - 36.0 g/dL Final   RDW 27/25/3664 16.5 (H)  11.5 - 15.5 % Final   Platelets 07/16/2022 310  150 - 400 K/uL Final   nRBC 07/16/2022  0.0  0.0 - 0.2 % Final   Neutrophils Relative % 07/16/2022 60  % Final   Neutro Abs 07/16/2022 6.2  1.7 - 7.7 K/uL Final   Lymphocytes Relative 07/16/2022 33  % Final   Lymphs Abs 07/16/2022 3.4  0.7 - 4.0 K/uL Final   Monocytes Relative 07/16/2022 5  % Final   Monocytes Absolute 07/16/2022 0.6  0.1 - 1.0 K/uL Final   Eosinophils Relative 07/16/2022 2  % Final   Eosinophils Absolute 07/16/2022 0.2  0.0 - 0.5 K/uL Final   Basophils Relative 07/16/2022 0  % Final   Basophils Absolute 07/16/2022 0.0  0.0 - 0.1 K/uL Final   Immature Granulocytes 07/16/2022 0  % Final   Abs Immature Granulocytes 07/16/2022 0.04  0.00 - 0.07 K/uL Final   Performed at [facility], [address], [place], [place] [address]   I-stat hCG, quantitative 07/16/2022 <5.0  <5 mIU/mL Final   Comment 3 07/16/2022          Final   Comment:   GEST. AGE      CONC.  (mIU/mL)   <=1 WEEK        5 - 50     2 WEEKS       50 - 500     3 WEEKS       100 - 10,000     4 WEEKS  1,000 - 30,000        FEMALE AND NON-PREGNANT FEMALE:     LESS THAN 5 mIU/mL    Acetaminophen (Tylenol), Serum 07/16/2022 <10 (L)  10 - 30 ug/mL Final   Comment: (NOTE) Therapeutic concentrations vary significantly. A range of 10-30 ug/mL  may be an effective concentration for many patients. However, some  are best treated at concentrations outside of this range. Acetaminophen concentrations >150 ug/mL at 4 hours after ingestion  and >50 ug/mL at 12 hours after ingestion are often associated with  toxic reactions.  Performed at [facility], [address] [place], [place] [address]    Salicylate Lvl 07/16/2022 <7.0 (L)  7.0 - 30.0 mg/dL Final   Performed at [facility], [address] [place], [place] [address]   Color, Urine 07/16/2022 YELLOW  YELLOW Final   APPearance 07/16/2022 CLOUDY (A)  CLEAR Final   Specific Gravity, Urine 07/16/2022 >1.030 (H)  1.005 - 1.030 Final   pH 07/16/2022 6.0  5.0 - 8.0 Final   Glucose, UA  07/16/2022 100 (A)  NEGATIVE mg/dL Final   Hgb urine dipstick 07/16/2022 NEGATIVE  NEGATIVE Final   Bilirubin Urine 07/16/2022 SMALL (A)  NEGATIVE Final   Ketones, ur 07/16/2022 NEGATIVE  NEGATIVE mg/dL Final   Protein, ur 09/81/1914 100 (A)  NEGATIVE mg/dL Final   Nitrite 78/29/5621 NEGATIVE  NEGATIVE Final   Leukocytes,Ua 07/16/2022 NEGATIVE  NEGATIVE Final   Performed at [facility], [address], [place], [place] [address]   RPR Ser Ql 07/16/2022 NON REACTIVE  NON REACTIVE Final   Performed at [facility], [address] [place], [place] [address]   Lithium Lvl 07/16/2022 0.06 (L)  0.60 - 1.20 mmol/L Final   Performed at [name], [address], [place], [place] [address]   HIV Screen 4th Generation wRfx 07/16/2022 Non Reactive  Non Reactive Final   Performed at [facility], [address] [place], [place] [address]   Neisseria Gonorrhea 07/16/2022 Negative   Final   Chlamydia 07/16/2022 Negative   Final   Comment 07/16/2022 Normal Reference Ranger Chlamydia - Negative   Final   Comment 07/16/2022 Normal Reference Range Neisseria Gonorrhea - Negative   Final   Yeast Wet Prep HPF POC 07/16/2022 NONE SEEN  NONE SEEN Final   Trich, Wet Prep 07/16/2022 NONE SEEN  NONE SEEN Final   Clue Cells Wet Prep HPF POC 07/16/2022 NONE SEEN  NONE SEEN Final   WBC, Wet Prep HPF POC 07/16/2022 <10  <10 Final   Sperm 07/16/2022 NONE SEEN   Final   Performed at [facility], [address], [place], [place] [address]   RBC / HPF 07/16/2022 0-5  0 - 5 RBC/hpf Final   WBC, UA 07/16/2022 0-5  0 - 5 WBC/hpf Final   Bacteria, UA 07/16/2022 MANY (A)  NONE SEEN Final   Squamous Epithelial / HPF 07/16/2022 0-5  0 - 5 /HPF Final   Mucus 07/16/2022 PRESENT   Final   Performed at [facility], [address], [place], [place] [address]   Specimen Description 07/16/2022 URINE, CLEAN CATCH   Final   Special Requests 07/16/2022    Final                    Value:NONE Performed at [facility], [address], [place], [place] [address]    Culture 07/16/2022  MULTIPLE SPECIES PRESENT, SUGGEST RECOLLECTION (A)   Final   Report Status 07/16/2022 07/18/2022 FINAL   Final   Glucose-Capillary 07/17/2022 227 (H)  70 - 99 mg/dL Final   Glucose reference range applies only to samples taken after fasting for at least 8 hours.   SARS Coronavirus 2 by RT PCR 07/17/2022 NEGATIVE  NEGATIVE Final   Influenza A by PCR 07/17/2022 NEGATIVE  NEGATIVE Final   Influenza B by PCR 07/17/2022 NEGATIVE  NEGATIVE Final   Comment: (NOTE) The Xpert Xpress SARS-CoV-2/FLU/RSV plus assay is intended as an aid in the diagnosis of influenza from Nasopharyngeal swab specimens and should not be used as a sole basis for treatment. Nasal washings and aspirates are unacceptable for Xpert Xpress SARS-CoV-2/FLU/RSV testing.  Fact Sheet for Patients: BloggerCourse.com  Fact Sheet for Healthcare Providers: SeriousBroker.it  This test is not yet approved or cleared by the [place] FDA and has been authorized for detection and/or diagnosis of SARS-CoV-2 by FDA under an Emergency Use Authorization (EUA). This EUA will remain in effect (meaning this test can be used) for the duration of the COVID-19 declaration under Section 564(b)(1) of the Act, 21 U.S.C. section 360bbb-3(b)(1), unless the authorization is terminated or revoked.     Resp Syncytial Virus by PCR 07/17/2022 NEGATIVE  NEGATIVE Final   Comment: (NOTE) Fact Sheet for Patients: BloggerCourse.com  Fact Sheet for Healthcare Providers: SeriousBroker.it  This test is not yet approved or cleared by the [place] FDA and has been authorized for detection and/or diagnosis of SARS-CoV-2 by FDA under an Emergency Use Authorization (EUA). This EUA will remain in effect (meaning this test can be used) for the  duration of the COVID-19 declaration under Section 564(b)(1) of the Act, 21 U.S.C. section 360bbb-3(b)(1), unless the authorization is terminated or revoked.  Performed at [facility], [address] [place], [place] [address]    Glucose-Capillary 07/17/2022 266 (H)  70 - 99 mg/dL Final   Glucose reference range applies only to samples taken after fasting for at least 8 hours.   Glucose-Capillary 07/17/2022 225 (H)  70 - 99 mg/dL Final   Glucose reference range applies only to samples taken after fasting for at least 8 hours.   Glucose-Capillary 07/17/2022 244 (H)  70 - 99 mg/dL Final   Glucose reference range applies only to samples taken after fasting for at least 8 hours.   Comment 1 07/17/2022 Notify RN   Final   Comment 2 07/17/2022 Document in Chart   Final   Glucose-Capillary 07/18/2022 217 (H)  70 - 99 mg/dL Final   Glucose reference range applies only to samples taken after fasting for at least 8 hours.  Admission on 07/09/2022, Discharged on 07/09/2022  Component Date Value Ref Range Status   SARS Coronavirus 2 by RT PCR 07/09/2022 NEGATIVE  NEGATIVE Final   Comment: (NOTE) SARS-CoV-2 target nucleic acids are NOT DETECTED.  The SARS-CoV-2 RNA is generally detectable in upper respiratory specimens during the acute phase of infection. The lowest concentration of SARS-CoV-2 viral copies this assay can detect is 138 copies/mL. A negative result does not preclude SARS-Cov-2 infection and should not be used as the sole basis for treatment or other patient management decisions. A negative result may occur with  improper specimen collection/handling, submission of specimen other than nasopharyngeal swab, presence of viral mutation(s) within the areas targeted by this assay, and inadequate number of viral copies(<138 copies/mL). A negative result must be combined with clinical observations, patient history,  and epidemiological information. The expected result is  Negative.  Fact Sheet for Patients:  BloggerCourse.com  Fact Sheet for Healthcare Providers:  SeriousBroker.it  This test is no                          t yet approved or cleared by the [place] FDA and  has been authorized for detection and/or diagnosis of SARS-CoV-2 by FDA under an Emergency Use Authorization (EUA). This EUA will remain  in effect (meaning this test can be used) for the duration of the COVID-19 declaration under Section 564(b)(1) of the Act, 21 U.S.C.section 360bbb-3(b)(1), unless the authorization is terminated  or revoked sooner.       Influenza A by PCR 07/09/2022 NEGATIVE  NEGATIVE Final   Influenza B by PCR 07/09/2022 POSITIVE (A)  NEGATIVE Final   Comment: (NOTE) The Xpert Xpress SARS-CoV-2/FLU/RSV plus assay is intended as an aid in the diagnosis of influenza from Nasopharyngeal swab specimens and should not be used as a sole basis for treatment. Nasal washings and aspirates are unacceptable for Xpert Xpress SARS-CoV-2/FLU/RSV testing.  Fact Sheet for Patients: BloggerCourse.com  Fact Sheet for Healthcare Providers: SeriousBroker.it  This test is not yet approved or cleared by the [place] FDA and has been authorized for detection and/or diagnosis of SARS-CoV-2 by FDA under an Emergency Use Authorization (EUA). This EUA will remain in effect (meaning this test can be used) for the duration of the COVID-19 declaration under Section 564(b)(1) of the Act, 21 U.S.C. section 360bbb-3(b)(1), unless the authorization is terminated or revoked.     Resp Syncytial Virus by PCR 07/09/2022 NEGATIVE  NEGATIVE Final   Comment: (NOTE) Fact Sheet for Patients: BloggerCourse.com  Fact Sheet for Healthcare Providers: SeriousBroker.it  This test is not yet approved or cleared by the [place] FDA  and has been authorized for detection and/or diagnosis of SARS-CoV-2 by FDA under an Emergency Use Authorization (EUA). This EUA will remain in effect (meaning this test can be used) for the duration of the COVID-19 declaration under Section 564(b)(1) of the Act, 21 U.S.C. section 360bbb-3(b)(1), unless the authorization is terminated or revoked.  Performed at [facility], [address] [place], [place] [address]    Sodium 07/09/2022 135  135 - 145 mmol/L Final   Potassium 07/09/2022 3.7  3.5 - 5.1 mmol/L Final   Chloride 07/09/2022 96 (L)  98 - 111 mmol/L Final   CO2 07/09/2022 25  22 - 32 mmol/L Final   Glucose, Bld 07/09/2022 347 (H)  70 - 99 mg/dL Final   Glucose reference range applies only to samples taken after fasting for at least 8 hours.   BUN 07/09/2022 6  6 - 20 mg/dL Final   Creatinine, Ser 07/09/2022 1.13 (H)  0.44 - 1.00 mg/dL Final   Calcium 69/62/9528 9.2  8.9 - 10.3 mg/dL Final   Total Protein 41/32/4401 9.0 (H)  6.5 - 8.1 g/dL Final   Albumin 02/72/5366 4.6  3.5 - 5.0 g/dL Final   AST 44/06/4740 34  15 - 41 U/L Final   ALT 07/09/2022 27  0 - 44 U/L Final   Alkaline Phosphatase 07/09/2022 55  38 - 126 U/L Final   Total Bilirubin 07/09/2022 0.3  0.3 - 1.2 mg/dL Final   GFR, Estimated 07/09/2022 >60  >60 mL/min Final   Comment: (NOTE) Calculated using the CKD-EPI Creatinine Equation (2021)    Anion gap 07/09/2022 14  5 -  15 Final   Performed at [facility], [address], [place], [place] [address]   Alcohol, Ethyl (B) 07/09/2022 <10  <10 mg/dL Final   Comment: (NOTE) Lowest detectable limit for serum alcohol is 10 mg/dL.  For medical purposes only. Performed at [facility], [address] [place], [place] [address]    Salicylate Lvl 07/09/2022 <7.0 (L)  7.0 - 30.0 mg/dL Final   Performed at [facility], [address] [place], [place] [address]   Acetaminophen (Tylenol),  Serum 07/09/2022 <10 (L)  10 - 30 ug/mL Final   Comment: (NOTE) Therapeutic concentrations vary significantly. A range of 10-30 ug/mL  may be an effective concentration for many patients. However, some  are best treated at concentrations outside of this range. Acetaminophen concentrations >150 ug/mL at 4 hours after ingestion  and >50 ug/mL at 12 hours after ingestion are often associated with  toxic reactions.  Performed at [facility] [address], [place], [place] [address]    WBC 07/09/2022 6.9  4.0 - 10.5 K/uL Final   RBC 07/09/2022 5.42 (H)  3.87 - 5.11 MIL/uL Final   Hemoglobin 07/09/2022 12.8  12.0 - 15.0 g/dL Final   HCT 13/11/6576 42.2  36.0 - 46.0 % Final   MCV 07/09/2022 77.9 (L)  80.0 - 100.0 fL Final   MCH 07/09/2022 23.6 (L)  26.0 - 34.0 pg Final   MCHC 07/09/2022 30.3  30.0 - 36.0 g/dL Final   RDW 46/96/2952 16.7 (H)  11.5 - 15.5 % Final   Platelets 07/09/2022 219  150 - 400 K/uL Final   Comment: SPECIMEN CHECKED FOR CLOTS REPEATED TO VERIFY    nRBC 07/09/2022 0.0  0.0 - 0.2 % Final   Performed at [facility], [address], [place], [place] [address]   I-stat hCG, quantitative 07/09/2022 <5.0  <5 mIU/mL Final   Comment 3 07/09/2022          Final   Comment:   GEST. AGE      CONC.  (mIU/mL)   <=1 WEEK        5 - 50     2 WEEKS       50 - 500     3 WEEKS       100 - 10,000     4 WEEKS     1,000 - 30,000        FEMALE AND NON-PREGNANT FEMALE:     LESS THAN 5 mIU/mL     Allergies: Wellbutrin [bupropion], Omnipaque [iohexol], Penicillins, Atarax [hydroxyzine], Contrast media [iodinated contrast media], and Cymbalta [duloxetine hcl]  Medications:  Facility Ordered Medications  Medication   acetaminophen (TYLENOL) tablet 650 mg   alum & mag hydroxide-simeth (MAALOX/MYLANTA) 200-200-20 MG/5ML suspension 30 mL   magnesium hydroxide (MILK OF MAGNESIA) suspension 30 mL   OLANZapine zydis (ZYPREXA) disintegrating tablet 10  mg   And   LORazepam (ATIVAN) tablet 1 mg   And   ziprasidone (GEODON) injection 20 mg   amLODipine (NORVASC) tablet 10 mg   benztropine (COGENTIN) tablet 0.5 mg   busPIRone (BUSPAR) tablet 10 mg   gabapentin (NEURONTIN) capsule 100 mg   haloperidol (HALDOL) tablet 10 mg   insulin aspart protamine- aspart (NOVOLOG MIX 70/30) injection 20 Units   lithium carbonate (ESKALITH) ER tablet 450 mg   metoprolol tartrate (LOPRESSOR) tablet 25 mg   simvastatin (ZOCOR) tablet 40 mg   traZODone (DESYREL) tablet 50 mg  venlafaxine XR (EFFEXOR-XR) 24 hr capsule 150 mg   PTA Medications  Medication Sig   metoprolol tartrate (LOPRESSOR) 25 MG tablet Take 25 mg by mouth 2 (two) times daily.   Cholecalciferol (VITAMIN D3) 25 MCG (1000 UT) CAPS Take 1,000 Units by mouth daily.   simvastatin (ZOCOR) 40 MG tablet Take 40 mg by mouth at bedtime.   amLODipine (NORVASC) 10 MG tablet Take 10 mg by mouth daily.   insulin aspart protamine- aspart (NOVOLOG MIX 70/30) (70-30) 100 UNIT/ML injection Inject 20 Units into the skin 2 (two) times daily with a meal.   traZODone (DESYREL) 50 MG tablet Take 1 tablet (50 mg total) by mouth at bedtime as needed for sleep. (Patient not taking: Reported on 07/06/2022)   lithium carbonate (ESKALITH) 450 MG ER tablet Take 1 tablet (450 mg total) by mouth at bedtime. For mood stabilization   benztropine (COGENTIN) 0.5 MG tablet Take 1 tablet (0.5 mg total) by mouth 2 (two) times daily. For eps   gabapentin (NEURONTIN) 100 MG capsule Take 1 capsule (100 mg total) by mouth 3 (three) times daily. For agitation   venlafaxine XR (EFFEXOR-XR) 150 MG 24 hr capsule Take 150 mg by mouth daily with breakfast.   haloperidol (HALDOL) 10 MG tablet Take 1 tablet (10 mg total) by mouth at bedtime.   ondansetron (ZOFRAN-ODT) 4 MG disintegrating tablet 4mg  ODT q4 hours prn nausea/vomit   busPIRone (BUSPAR) 10 MG tablet Take 1 tablet (10 mg total) by mouth 3 (three) times daily.      Medical  Decision Making  Inpatient observation    Meds ordered this encounter  Medications   acetaminophen (TYLENOL) tablet 650 mg   alum & mag hydroxide-simeth (MAALOX/MYLANTA) 200-200-20 MG/5ML suspension 30 mL   magnesium hydroxide (MILK OF MAGNESIA) suspension 30 mL   AND Linked Order Group    OLANZapine zydis (ZYPREXA) disintegrating tablet 10 mg    LORazepam (ATIVAN) tablet 1 mg    ziprasidone (GEODON) injection 20 mg   amLODipine (NORVASC) tablet 10 mg   benztropine (COGENTIN) tablet 0.5 mg   busPIRone (BUSPAR) tablet 10 mg   gabapentin (NEURONTIN) capsule 100 mg   haloperidol (HALDOL) tablet 10 mg   insulin aspart protamine- aspart (NOVOLOG MIX 70/30) injection 20 Units   lithium carbonate (ESKALITH) ER tablet 450 mg   metoprolol tartrate (LOPRESSOR) tablet 25 mg   simvastatin (ZOCOR) tablet 40 mg   traZODone (DESYREL) tablet 50 mg   venlafaxine XR (EFFEXOR-XR) 24 hr capsule 150 mg    Lab Orders         POCT Urine Drug Screen - (I-Screen)         POC urine preg, ED     Recommendations  Based on my evaluation the patient does not appear to have an emergency medical condition.  Sindy Guadeloupe, NP 01/06/23  6:08 AM

## 2023-01-06 NOTE — BH Assessment (Signed)
Comprehensive Clinical Assessment (CCA) Note  01/06/2023 [name] [id number]  Disposition: Sindy Guadeloupe, NP, recommends continuous observation for safety with psych reassessment in the AM.   The patient demonstrates the following risk factors for suicide: Chronic risk factors for suicide include: psychiatric disorder of schizoaffective disorder, bipolar 1,  . Acute risk factors for suicide include: family or marital conflict. Protective factors for this patient include: positive therapeutic relationship. Considering these factors, the overall suicide risk at this point appears to be high. Patient is not appropriate for outpatient follow up.   [name] is a 32 year old voluntary walk-in to [facility] due to [facility] with plan to overdose on her medications. Patient denied HI, psychosis and alcohol/drug usage.   Patient reports stressors include, psych medications are not working, "I went to visit my mother and she don't want me around, she started raising hell and didn't want me over there", also patient reports "my boyfriend stays out all night and comes in late, I don't want to deal with that". Patient reports wanting to move to [facility] but her case worker told her she has to save up some money and patient states that she already has a lot of money saved up. Patient continues to state she wants to move to [place]. Patient resides alone and is on disability. Patient denied access to guns. Patient is being followed by her ACT Team. Patient was cooperative during assessment and is seeking inpatient treatment at [facility].     Chief Complaint:  Chief Complaint  Patient presents with   Suicidal   Visit Diagnosis:  Major depressive disorder    CCA Screening, Triage and Referral (STR)  Patient Reported Information How did you hear about [place]? Self  What Is the Reason for Your Visit/Call Today? Pt presents to [facility] voluntarily, unaccompanied with complaint of suicidal ideation with a plan to  overdose on her medication. Pt reports that constant arguing with her mother and boyfriend are immediate stressors. Pt continues to work with ACTT team with [facility]. Pt states " I want to move to ,but they are not taking me serious". Pt denies HI,AVH and substance/alcohol use.  How Long Has This Been Causing You Problems? <Week  What Do You Feel Would Help You the Most Today? Treatment for Depression or other mood problem   Have You Recently Had Any Thoughts About Hurting Yourself? Yes  Are You Planning to Commit Suicide/Harm Yourself At This time? Yes   Flowsheet Row ED from 01/06/2023 in [facility] ED from 01/02/2023 in [facility] ED from 12/19/2022 in [facility] at [facility]  C-SSRS RISK CATEGORY High Risk High Risk No Risk       Have you Recently Had Thoughts About Hurting Someone [name]? No  Are You Planning to Harm Someone at This Time? No  Explanation: pt denies   Have You Used Any Alcohol or Drugs in the Past 24 Hours? No  What Did You Use and How Much? n/a   Do You Currently Have a Therapist/Psychiatrist? Yes  Name of Therapist/Psychiatrist: Name of Therapist/Psychiatrist: ACT Team   Have You Been Recently Discharged From Any Office Practice or Programs? No  Explanation of Discharge From Practice/Program: n/a     CCA Screening Triage Referral Assessment Type of Contact: Face-to-Face  Telemedicine Service Delivery:   Is this Initial or Reassessment?   Date Telepsych consult ordered in [facility]:    Time Telepsych consult ordered in [facility]:  Location of Assessment: [facility] Assessment Services  Provider Location: [facility] Assessment Services   Collateral Involvement: none   Does Patient Have a Automotive engineer Guardian? -- (n/a)  Legal Guardian Contact Information: n/a  Copy of Legal Guardianship Form: -- (n/a)  Legal Guardian Notified of  Arrival: -- (n/a)  Legal Guardian Notified of Pending Discharge: -- (n/a)  If Minor and Not Living with Parent(s), Who has Custody? n/a  Is CPS involved or ever been involved? Never  Is APS involved or ever been involved? Never   Patient Determined To Be At Risk for Harm To Self or Others Based on Review of Patient Reported Information or Presenting Complaint? Yes, for Self-Harm  Method: Plan with intent and identified person  Availability of Means: In hand or used  Intent: Clearly intends on inflicting harm that could cause death  Notification Required: No need or identified person  Additional Information for Danger to Others Potential: -- (n/a)  Additional Comments for Danger to Others Potential: n/a  Are There Guns or Other Weapons in Your Home? No  Types of Guns/Weapons: n/a  Are These Weapons Safely Secured?                            -- (n/a)  Who Could Verify You Are Able To Have These Secured: n/a  Do You Have any Outstanding Charges, Pending Court Dates, Parole/Probation? none reported  Contacted To Inform of Risk of Harm To Self or Others: Other: Comment    Does Patient Present under Involuntary Commitment? No    [place] of Residence: [place]   Patient Currently Receiving the Following Services: Medication Management; ACTT Engineer, agricultural Treatment); Individual Therapy   Determination of Need: Urgent (48 hours)   Options For Referral: Other: Comment; BH Urgent Care; Outpatient Therapy; Medication Management; Inpatient Hospitalization     CCA Biopsychosocial Patient Reported Schizophrenia/Schizoaffective Diagnosis in Past: No   Strengths: Pt. is engaged in ACTT   Mental Health Symptoms Depression:   Sleep (too much or little); Irritability; Hopelessness; Worthlessness; Fatigue; Difficulty Concentrating   Duration of Depressive symptoms:  Duration of Depressive Symptoms: Greater than two weeks   Mania:   Change in energy/activity;  Increased Energy; Irritability; Recklessness; Racing thoughts   Anxiety:    Worrying; Tension; Restlessness   Psychosis:   None   Duration of Psychotic symptoms:  Duration of Psychotic Symptoms: N/A   Trauma:   None   Obsessions:   None   Compulsions:   None   Inattention:   None   Hyperactivity/Impulsivity:   N/A   Oppositional/Defiant Behaviors:   N/A   Emotional Irregularity:   N/A   Other Mood/Personality Symptoms:   None    Mental Status Exam Appearance and self-care  Stature:   Average   Weight:   Obese   Clothing:   Casual   Grooming:   Normal   Cosmetic use:   Age appropriate   Posture/gait:   Tense   Motor activity:   Not Remarkable   Sensorium  Attention:   Normal   Concentration:   Normal   Orientation:   Object; Person; Place; Time; X5; Situation   Recall/memory:   Normal   Affect and Mood  Affect:   Appropriate   Mood:   Anxious   Relating  Eye contact:   Normal   Facial expression:   Depressed; Tense   Attitude toward examiner:   Cooperative   Thought and  Language  Speech flow:  Loud   Thought content:   Appropriate to Mood and Circumstances   Preoccupation:   None   Hallucinations:   None   Organization:   Coherent   [employer] of Knowledge:   Average   Intelligence:   Below average   Abstraction:   Functional   Judgement:   Fair   Reality Testing:   Distorted   Insight:   Lacking   Decision Making:   Impulsive   Social Functioning  Social Maturity:   Impulsive; Irresponsible   Social Judgement:   Heedless   Stress  Stressors:   Family conflict   Coping Ability:   Overwhelmed   Skill Deficits:   Intellect/education; Programmer, applications; Interpersonal; Communication; Decision making   Supports:   Support needed; Friends/Service system     Religion: Religion/Spirituality Are You A Religious Person?: No How Might This Affect Treatment?:  N/A  Leisure/Recreation: Leisure / Recreation Do You Have Hobbies?: No  Exercise/Diet: Exercise/Diet Do You Exercise?: No Have You Gained or Lost A Significant Amount of Weight in the Past Six Months?: No Do You Follow a Special Diet?: No Do You Have Any Trouble Sleeping?: No   CCA Employment/Education Employment/Work Situation: Employment / Work Systems developer: On disability Why is Patient on Disability: mental health How Long has Patient Been on Disability: unknown Patient's Job has Been Impacted by Current Illness: No Has Patient ever Been in the [employer]?: No  Education: Education Is Patient Currently Attending School?: No Last Grade Completed: 11 Did You Attend College?: No Did You Have An Individualized Education Program (IIEP): No Did You Have Any Difficulty At School?: No Patient's Education Has Been Impacted by Current Illness: No   CCA Family/Childhood History Family and Relationship History: Family history Marital status: Single Does patient have children?: Yes How many children?: 3 How is patient's relationship with their children?: Poor relationship as she no  longer has custody.  Childhood History:  Childhood History By whom was/is the patient raised?: Mother Did patient suffer any verbal/emotional/physical/sexual abuse as a child?: No Did patient suffer from severe childhood neglect?: No Has patient ever been sexually abused/assaulted/raped as an adolescent or adult?: No Was the patient ever a victim of a crime or a disaster?: No Witnessed domestic violence?: No Has patient been affected by domestic violence as an adult?: No       CCA Substance Use Alcohol/Drug Use: Alcohol / Drug Use Pain Medications: See MAR Prescriptions: See MAR Over the Counter: See MAR History of alcohol / drug use?: No history of alcohol / drug abuse Longest period of sobriety (when/how long): N/A Negative Consequences of Use:  (N/A) Withdrawal  Symptoms:  (N/A)                         ASAM's:  Six Dimensions of Multidimensional Assessment  Dimension 1:  Acute Intoxication and/or Withdrawal Potential:   Dimension 1:  Description of individual's past and current experiences of substance use and withdrawal: NA  Dimension 2:  Biomedical Conditions and Complications:   Dimension 2:  Description of patient's biomedical conditions and  complications: NA  Dimension 3:  Emotional, Behavioral, or Cognitive Conditions and Complications:  Dimension 3:  Description of emotional, behavioral, or cognitive conditions and complications: NA  Dimension 4:  Readiness to Change:  Dimension 4:  Description of Readiness to Change criteria: NA  Dimension 5:  Relapse, Continued use, or Continued Problem Potential:  Dimension  5:  Relapse, continued use, or continued problem potential critiera description: NA  Dimension 6:  Recovery/Living Environment:  Dimension 6:  Recovery/Iiving environment criteria description: NA  ASAM Severity Score: ASAM's Severity Rating Score: 0  ASAM Recommended Level of Treatment: ASAM Recommended Level of Treatment:  (n/a)   Substance use Disorder (SUD) Substance Use Disorder (SUD)  Checklist Symptoms of Substance Use:  (Pt denies substance use)  Recommendations for Services/Supports/Treatments: Recommendations for Services/Supports/Treatments Recommendations For Services/Supports/Treatments: Medication Management, ACCTT (Assertive Community Treatment), Other (Comment), Inpatient Hospitalization, Day Treatment  Discharge Disposition: Discharge Disposition Medical Exam completed: Yes  DSM5 Diagnoses: Patient Active Problem List   Diagnosis Date Noted   Agitation 09/28/2022   Suicidal ideations 01/04/2022   Suicidal overdose (HCC) 03/23/2021   Syphilis 07/15/2020   Malingering 06/05/2020   Gastroesophageal reflux disease 05/04/2020   Hyperglycemia due to type 2 diabetes mellitus (HCC) 05/04/2020   Long term  (current) use of insulin (HCC) 05/04/2020   Migraine without aura 05/04/2020   Morbid obesity (HCC) 05/04/2020   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 2 diabetes mellitus (HCC) 05/04/2020   Prolapsed internal hemorrhoids 05/04/2020   Vitamin D deficiency 05/04/2020   Other symptoms and signs involving cognitive functions and awareness 05/04/2020   Suicide attempt (HCC)    Anxiety state 03/06/2020   Schizophrenia (HCC) 09/13/2019   Bipolar I disorder, most recent episode depressed (HCC) 06/23/2019   MDD (major depressive disorder) 10/10/2018   Schizoaffective disorder, bipolar type (HCC) 09/25/2018   Affective psychosis, bipolar (HCC) 06/13/2018   HTN (hypertension) 05/03/2018   Tobacco use disorder 05/03/2018   Adjustment disorder with emotional disturbance 01/02/2018   Schizophrenia, 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   Moderate bipolar I disorder, most recent episode depressed (HCC)    Schizoaffective disorder (HCC)    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/2017   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   OCD (obsessive compulsive disorder) 10/05/2016   Major depressive disorder, recurrent episode, mild (HCC) 05/04/2016   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   Learning disability 07/18/2015   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/2015   Diabetes mellitus (HCC) 07/18/2015   MDD (major depressive disorder), recurrent, severe, 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   Hyperlipidemia 07/18/2015   Severe episode of recurrent major depressive disorder, without psychotic features (HCC)    Suicidal ideation    Intentional overdose (HCC)    Cognitive deficits 10/12/2012   Generalized anxiety disorder 06/28/2012     Referrals to Alternative Service(s): Referred to Alternative Service(s):   Place:   Date:   Time:    Referred to Alternative Service(s):   Place:   Date:   Time:    Referred to Alternative Service(s):   Place:   Date:   Time:    Referred to Alternative Service(s):    Place:   Date:   Time:     Burnetta Sabin, [facility]

## 2023-01-06 NOTE — ED Notes (Signed)
Pt was provided breakfast.

## 2023-01-06 NOTE — ED Provider Notes (Signed)
FBC/OBS ASAP Discharge Summary  Date and Time: 01/06/2023 10:15 AM  Name: [name]  MRN:  [id number]   Discharge Diagnoses:  Final diagnoses:  Suicidal ideation  Schizoaffective disorder, bipolar type (HCC)    Subjective: I am ready to go, I was just....overwhelmed"  Stay Summary:   [name] is a 32 year old female admitted voluntarily to Observation unit last night secondary to suicidal ideations. She presented to this facility reporting that she was thinking about overdosing on her medications. She reported to the provider that her medications are not working. She reported that she had discussed this problem with her ACT team ([facility]) and they did not do anything about it.   Per chart review, patient presented with suicidal ideations with a plan of overdosing on her medications. She denied HI/AVH. She reported use of Marijuana occasionally and last use was a week ago. Patient denied using alcohol any other non prescribed drugs.   Assessment: 32 year old female sitting in her bed. Alert and oriented x 4. She is talkative and laud, saying that my ACT team doesn't want to help me with my medications, I keep telling them they are not working but they are not doing anything about it". Patient reports she had been feeling overwhelmed "too much going in the family". She reports that she wants to be transferred to [facility] "I can't go anywhere else...". Patient stops participating in the assessment and states "actually I want to go home, I am not suicidal any more, please let me go".   Patient kept insisting on being discharged and Dr Rebecca Eaton was notified. Patient was discharged with no sign of distress. Reported that she will follow up with her ACT team. ACT team was contacted and voicemail delivered.   Total Time spent with patient: 20 minutes  Past Psychiatric History: GAD, MDD, Borderline intellectual disability, learning disability, OCD,  Past Medical History: DM2,   Family History: NA Family Psychiatric History: NA Social History: has ACT team services, family is supportive Tobacco Cessation:  Prescription not provided because: Patient refused  Current Medications:  Current Facility-Administered Medications  Medication Dose Route Frequency Provider Last Rate Last Admin   acetaminophen (TYLENOL) tablet 650 mg  650 mg Oral Q6H PRN Sindy Guadeloupe, NP       alum & mag hydroxide-simeth (MAALOX/MYLANTA) 200-200-20 MG/5ML suspension 30 mL  30 mL Oral Q4H PRN Sindy Guadeloupe, NP       amLODipine (NORVASC) tablet 10 mg  10 mg Oral Daily Sindy Guadeloupe, NP   10 mg at 01/06/23 0905   benztropine (COGENTIN) tablet 0.5 mg  0.5 mg Oral BID Sindy Guadeloupe, NP   0.5 mg at 01/06/23 0905   busPIRone (BUSPAR) tablet 10 mg  10 mg Oral TID Sindy Guadeloupe, NP   10 mg at 01/06/23 0905   gabapentin (NEURONTIN) capsule 100 mg  100 mg Oral TID Sindy Guadeloupe, NP   100 mg at 01/06/23 0905   haloperidol (HALDOL) tablet 10 mg  10 mg Oral QHS Sindy Guadeloupe, NP       insulin aspart protamine- aspart (NOVOLOG MIX 70/30) injection 20 Units  20 Units Subcutaneous BID WC Sindy Guadeloupe, NP       lithium carbonate (ESKALITH) ER tablet 450 mg  450 mg Oral QHS Sindy Guadeloupe, NP       magnesium hydroxide (MILK OF MAGNESIA) suspension 30 mL  30 mL Oral Daily PRN Sindy Guadeloupe, NP       metoprolol tartrate (LOPRESSOR) tablet  25 mg  25 mg Oral BID Sindy Guadeloupe, NP   25 mg at 01/06/23 0905   OLANZapine zydis (ZYPREXA) disintegrating tablet 10 mg  10 mg Oral Q8H PRN Sindy Guadeloupe, NP       And   ziprasidone (GEODON) injection 20 mg  20 mg Intramuscular PRN Sindy Guadeloupe, NP       simvastatin (ZOCOR) tablet 40 mg  40 mg Oral QHS Sindy Guadeloupe, NP       traZODone (DESYREL) tablet 50 mg  50 mg Oral QHS PRN Sindy Guadeloupe, NP       venlafaxine XR (EFFEXOR-XR) 24 hr capsule 150 mg  150 mg Oral Q breakfast Sindy Guadeloupe, NP   150 mg at 01/06/23 3086   Current Outpatient Medications  Medication Sig Dispense  Refill   amLODipine (NORVASC) 10 MG tablet Take 10 mg by mouth daily.     benztropine (COGENTIN) 0.5 MG tablet Take 1 tablet (0.5 mg total) by mouth 2 (two) times daily. For eps 60 tablet 0   busPIRone (BUSPAR) 10 MG tablet Take 1 tablet (10 mg total) by mouth 3 (three) times daily. 90 tablet 0   Cholecalciferol (VITAMIN D3) 25 MCG (1000 UT) CAPS Take 1,000 Units by mouth daily.     gabapentin (NEURONTIN) 100 MG capsule Take 1 capsule (100 mg total) by mouth 3 (three) times daily. For agitation 90 capsule 0   haloperidol (HALDOL) 10 MG tablet Take 1 tablet (10 mg total) by mouth at bedtime.     insulin aspart protamine- aspart (NOVOLOG MIX 70/30) (70-30) 100 UNIT/ML injection Inject 20 Units into the skin 2 (two) times daily with a meal.     lithium carbonate (ESKALITH) 450 MG ER tablet Take 1 tablet (450 mg total) by mouth at bedtime. For mood stabilization 30 tablet 0   metoprolol tartrate (LOPRESSOR) 25 MG tablet Take 25 mg by mouth 2 (two) times daily.     ondansetron (ZOFRAN-ODT) 4 MG disintegrating tablet 4mg  ODT q4 hours prn nausea/vomit 20 tablet 0   simvastatin (ZOCOR) 40 MG tablet Take 40 mg by mouth at bedtime.     traZODone (DESYREL) 50 MG tablet Take 1 tablet (50 mg total) by mouth at bedtime as needed for sleep. (Patient not taking: Reported on 07/06/2022) 30 tablet 0   venlafaxine XR (EFFEXOR-XR) 150 MG 24 hr capsule Take 150 mg by mouth daily with breakfast.      PTA Medications:  Facility Ordered Medications  Medication   acetaminophen (TYLENOL) tablet 650 mg   alum & mag hydroxide-simeth (MAALOX/MYLANTA) 200-200-20 MG/5ML suspension 30 mL   magnesium hydroxide (MILK OF MAGNESIA) suspension 30 mL   OLANZapine zydis (ZYPREXA) disintegrating tablet 10 mg   And   [COMPLETED] LORazepam (ATIVAN) tablet 1 mg   And   ziprasidone (GEODON) injection 20 mg   amLODipine (NORVASC) tablet 10 mg   benztropine (COGENTIN) tablet 0.5 mg   busPIRone (BUSPAR) tablet 10 mg   gabapentin  (NEURONTIN) capsule 100 mg   haloperidol (HALDOL) tablet 10 mg   insulin aspart protamine- aspart (NOVOLOG MIX 70/30) injection 20 Units   lithium carbonate (ESKALITH) ER tablet 450 mg   metoprolol tartrate (LOPRESSOR) tablet 25 mg   simvastatin (ZOCOR) tablet 40 mg   traZODone (DESYREL) tablet 50 mg   venlafaxine XR (EFFEXOR-XR) 24 hr capsule 150 mg   PTA Medications  Medication Sig   metoprolol tartrate (LOPRESSOR) 25 MG tablet Take 25 mg by mouth 2 (two) times daily.   Cholecalciferol (  VITAMIN D3) 25 MCG (1000 UT) CAPS Take 1,000 Units by mouth daily.   simvastatin (ZOCOR) 40 MG tablet Take 40 mg by mouth at bedtime.   amLODipine (NORVASC) 10 MG tablet Take 10 mg by mouth daily.   insulin aspart protamine- aspart (NOVOLOG MIX 70/30) (70-30) 100 UNIT/ML injection Inject 20 Units into the skin 2 (two) times daily with a meal.   traZODone (DESYREL) 50 MG tablet Take 1 tablet (50 mg total) by mouth at bedtime as needed for sleep. (Patient not taking: Reported on 07/06/2022)   lithium carbonate (ESKALITH) 450 MG ER tablet Take 1 tablet (450 mg total) by mouth at bedtime. For mood stabilization   benztropine (COGENTIN) 0.5 MG tablet Take 1 tablet (0.5 mg total) by mouth 2 (two) times daily. For eps   gabapentin (NEURONTIN) 100 MG capsule Take 1 capsule (100 mg total) by mouth 3 (three) times daily. For agitation   venlafaxine XR (EFFEXOR-XR) 150 MG 24 hr capsule Take 150 mg by mouth daily with breakfast.   haloperidol (HALDOL) 10 MG tablet Take 1 tablet (10 mg total) by mouth at bedtime.   ondansetron (ZOFRAN-ODT) 4 MG disintegrating tablet 4mg  ODT q4 hours prn nausea/vomit   busPIRone (BUSPAR) 10 MG tablet Take 1 tablet (10 mg total) by mouth 3 (three) times daily.       07/03/2022    1:22 AM 04/09/2022    8:58 PM 01/10/2022    6:13 PM  Depression screen PHQ 2/9  Decreased Interest 1 1 2   Down, Depressed, Hopeless 1 1 2   PHQ - 2 Score 2 2 4   Altered sleeping 2 2 2   Tired, decreased  energy 2 2 1   Change in appetite 1 1 2   Feeling bad or failure about yourself  2 2 2   Trouble concentrating 1 1 2   Moving slowly or fidgety/restless 2 1 2   Suicidal thoughts 1 1 2   PHQ-9 Score 13 12 17   Difficult doing work/chores Somewhat difficult Somewhat difficult Very difficult    Flowsheet Row ED from 01/06/2023 in [facility] ED from 01/02/2023 in [facility] ED from 12/19/2022 in [facility] Emergency Department at [facility]  C-SSRS RISK CATEGORY High Risk High Risk No Risk       Musculoskeletal  Strength & Muscle Tone: within normal limits Gait & Station: normal Patient leans: N/A  Psychiatric Specialty Exam  Presentation  General Appearance:  Casual  Eye Contact: Fair  Speech: Clear and Coherent  Speech Volume: Normal  Handedness: Right   Mood and Affect  Mood: Euthymic  Affect: Appropriate   Thought Process  Thought Processes: Coherent  Descriptions of Associations:Intact  Orientation:Full (Time, Place and Person)  Thought Content:Logical  Diagnosis of Schizophrenia or Schizoaffective disorder in past: No    Hallucinations:Hallucinations: None  Ideas of Reference:None  Suicidal Thoughts:Suicidal Thoughts: No SI Active Intent and/or Plan: With Intent; With Plan  Homicidal Thoughts:Homicidal Thoughts: No   Sensorium  Memory: Immediate Fair; Recent Fair; Remote Fair  Judgment: Fair  Insight: Fair   Art therapist  Concentration: Good  Attention Span: Fair  Recall: [employer] of Knowledge: Fair  Language: Fair   Psychomotor Activity  Psychomotor Activity: Psychomotor Activity: Normal   Assets  Assets: Communication Skills; Desire for Improvement; Housing; Social Support; Transportation   Sleep  Sleep: Sleep: Good Number of Hours of Sleep: 8   Nutritional Assessment (For OBS and FBC admissions only) Has the patient had a weight  loss or gain of 10 pounds or more in the last 3 months?: No Has the patient had a decrease in food intake/or appetite?: No Does the patient have dental problems?: No Does the patient have eating habits or behaviors that may be indicators of an eating disorder including binging or inducing vomiting?: No Has the patient recently lost weight without trying?: 0 Has the patient been eating poorly because of a decreased appetite?: 0 Malnutrition Screening Tool Score: 0    Physical Exam  Physical Exam Vitals and nursing note reviewed.  Constitutional:      Appearance: Normal appearance.  HENT:     Head: Normocephalic and atraumatic.     Right Ear: Tympanic membrane normal.     Left Ear: Tympanic membrane normal.  Eyes:     Extraocular Movements: Extraocular movements intact.     Pupils: Pupils are equal, round, and reactive to light.  Cardiovascular:     Rate and Rhythm: Normal rate.  Pulmonary:     Effort: Pulmonary effort is normal.  Musculoskeletal:        General: Normal range of motion.     Cervical back: Normal range of motion and neck supple.  Neurological:     General: No focal deficit present.     Mental Status: She is alert and oriented to person, place, and time.  Psychiatric:        Thought Content: Thought content normal.    Review of Systems  Constitutional: Negative.   HENT: Negative.    Eyes: Negative.   Respiratory: Negative.    Cardiovascular: Negative.   Gastrointestinal: Negative.   Genitourinary: Negative.   Musculoskeletal: Negative.   Skin: Negative.   Neurological: Negative.   Endo/Heme/Allergies: Negative.   Psychiatric/Behavioral: Negative.     Blood pressure (!) 140/78, pulse 88, temperature 97.8 F (36.6 C), temperature source Oral, resp. rate 18, SpO2 100%. There is no height or weight on file to calculate BMI.  Demographic Factors:  Low socioeconomic status and Unemployed  Loss Factors: NA  Historical Factors: Impulsivity  Risk  Reduction Factors:   Living with another person, especially a relative and Positive social support  Continued Clinical Symptoms:  Obsessive-Compulsive Disorder  Cognitive Features That Contribute To Risk:  None    Suicide Risk:  Minimal: No identifiable suicidal ideation.  Patients presenting with no risk factors but with morbid ruminations; may be classified as minimal risk based on the severity of the depressive symptoms  Plan Of Care/Follow-up recommendations:  Activity:  As tolerated  Disposition: Discharged to current address. To follow up with her ACT team services  Olin Pia, NP 01/06/2023, 10:15 AM

## 2023-01-06 NOTE — Discharge Instructions (Addendum)

## 2023-01-06 NOTE — ED Notes (Signed)
[facility] here for pt. Pt a/o & ambulatory. VSS. Safety maintained.

## 2023-01-06 NOTE — ED Notes (Signed)
Pt discharged to home with AVS. AVS reviewed prior to discharge.  Pt alert, oriented, and ambulatory. Safety maintained. [facility] called for pt. Will continue to monitor and report any COC.

## 2023-01-08 ENCOUNTER — Other Ambulatory Visit: Payer: Self-pay

## 2023-01-08 ENCOUNTER — Encounter (HOSPITAL_COMMUNITY): Payer: Self-pay

## 2023-01-08 ENCOUNTER — Emergency Department (HOSPITAL_COMMUNITY)
Admission: EM | Admit: 2023-01-08 | Discharge: 2023-01-09 | Disposition: A | Discharge status: Home / Self Care | Payer: 59 | Attending: Emergency Medicine | Admitting: Emergency Medicine

## 2023-01-08 DIAGNOSIS — R45851 Suicidal ideations: Secondary | ICD-10-CM | POA: Diagnosis not present

## 2023-01-08 DIAGNOSIS — T391X1A Poisoning by 4-Aminophenol derivatives, accidental (unintentional), initial encounter: Secondary | ICD-10-CM | POA: Diagnosis not present

## 2023-01-08 DIAGNOSIS — R0902 Hypoxemia: Secondary | ICD-10-CM | POA: Diagnosis not present

## 2023-01-08 DIAGNOSIS — T50902A Poisoning by unspecified drugs, medicaments and biological substances, intentional self-harm, initial encounter: Secondary | ICD-10-CM | POA: Diagnosis not present

## 2023-01-08 DIAGNOSIS — T50901A Poisoning by unspecified drugs, medicaments and biological substances, accidental (unintentional), initial encounter: Secondary | ICD-10-CM | POA: Insufficient documentation

## 2023-01-08 DIAGNOSIS — R42 Dizziness and giddiness: Secondary | ICD-10-CM | POA: Diagnosis not present

## 2023-01-08 DIAGNOSIS — Z794 Long term (current) use of insulin: Secondary | ICD-10-CM | POA: Insufficient documentation

## 2023-01-08 DIAGNOSIS — Z79899 Other long term (current) drug therapy: Secondary | ICD-10-CM | POA: Insufficient documentation

## 2023-01-08 LAB — COMPREHENSIVE METABOLIC PANEL
ALT: 28 U/L (ref 0–44)
AST: 28 U/L (ref 15–41)
Albumin: 4.4 g/dL (ref 3.5–5.0)
Alkaline Phosphatase: 64 U/L (ref 38–126)
Anion gap: 9 (ref 5–15)
BUN: 8 mg/dL (ref 6–20)
CO2: 25 mmol/L (ref 22–32)
Calcium: 9.3 mg/dL (ref 8.9–10.3)
Chloride: 103 mmol/L (ref 98–111)
Creatinine, Ser: 0.71 mg/dL (ref 0.44–1.00)
GFR, Estimated: >60 mL/min (ref >60)
Glucose, Bld: 178 mg/dL — ABNORMAL HIGH (ref 70–99)
Potassium: 3.2 mmol/L — ABNORMAL LOW (ref 3.5–5.1)
Sodium: 137 mmol/L (ref 135–145)
Total Bilirubin: 0.8 mg/dL (ref 0.3–1.2)
Total Protein: 8 g/dL (ref 6.5–8.1)

## 2023-01-08 LAB — CBC WITH DIFFERENTIAL/PLATELET
Abs Immature Granulocytes: 0.05 10*3/uL (ref 0.00–0.07)
Basophils Absolute: 0.1 10*3/uL (ref 0.0–0.1)
Basophils Relative: 1 %
Eosinophils Absolute: 0.3 10*3/uL (ref 0.0–0.5)
Eosinophils Relative: 2 %
HCT: 36.9 % (ref 36.0–46.0)
Hemoglobin: 11.2 g/dL — ABNORMAL LOW (ref 12.0–15.0)
Immature Granulocytes: 0 %
Lymphocytes Relative: 36 %
Lymphs Abs: 4.8 10*3/uL — ABNORMAL HIGH (ref 0.7–4.0)
MCH: 24 pg — ABNORMAL LOW (ref 26.0–34.0)
MCHC: 30.4 g/dL (ref 30.0–36.0)
MCV: 79 fL — ABNORMAL LOW (ref 80.0–100.0)
Monocytes Absolute: 0.6 10*3/uL (ref 0.1–1.0)
Monocytes Relative: 5 %
Neutro Abs: 7.3 10*3/uL (ref 1.7–7.7)
Neutrophils Relative %: 56 %
Platelets: 338 10*3/uL (ref 150–400)
RBC: 4.67 MIL/uL (ref 3.87–5.11)
RDW: 15.7 % — ABNORMAL HIGH (ref 11.5–15.5)
WBC: 13.1 10*3/uL — ABNORMAL HIGH (ref 4.0–10.5)
nRBC: 0 % (ref 0.0–0.2)

## 2023-01-08 LAB — ACETAMINOPHEN LEVEL: Acetaminophen (Tylenol), Serum: 55 ug/mL — ABNORMAL HIGH (ref 10–30)

## 2023-01-08 LAB — ETHANOL: Alcohol, Ethyl (B): <10 mg/dL (ref <10)

## 2023-01-08 LAB — RAPID URINE DRUG SCREEN, HOSP PERFORMED
Amphetamines: NOT DETECTED
Barbiturates: NOT DETECTED
Benzodiazepines: NOT DETECTED
Cocaine: NOT DETECTED
Opiates: NOT DETECTED
Tetrahydrocannabinol: NOT DETECTED

## 2023-01-08 LAB — HCG, SERUM, QUALITATIVE: Preg, Serum: NEGATIVE

## 2023-01-08 LAB — SALICYLATE LEVEL: Salicylate Lvl: <7 mg/dL — ABNORMAL LOW (ref 7.0–30.0)

## 2023-01-08 MED ORDER — DIPHENHYDRAMINE HCL 25 MG PO CAPS
50.0000 mg | ORAL_CAPSULE | Freq: Once | ORAL | Status: AC
  Administered 2023-01-08: 50 mg via ORAL
  Filled 2023-01-08: qty 2

## 2023-01-08 MED ORDER — LORAZEPAM 1 MG PO TABS
2.0000 mg | ORAL_TABLET | Freq: Once | ORAL | Status: AC
  Administered 2023-01-08: 2 mg via ORAL
  Filled 2023-01-08: qty 2

## 2023-01-08 NOTE — ED Triage Notes (Signed)
[place] [facility] [place] with c/o suicidal ideations and ingestion on 12 tylenol unknown strength.   Ingestion at approximately 1845 tonight.

## 2023-01-08 NOTE — ED Provider Triage Note (Signed)
Emergency Medicine Provider Triage Evaluation Note  [name] , a 32 y.o. female  was evaluated in triage.  Pt complains of overdose on 12 Tylenols of unknown strength.  Ingestion occurred at 1845 tonight.  Admits to SI.  No HI or auditory/visual hallucinations. No other ingestions.   Review of Systems  Positive: SI Negative: HI  Physical Exam  BP (!) 142/95 (BP Location: Left Arm)   Pulse 98   Temp 98.5 F (36.9 C) (Oral)   Resp 16   SpO2 97%  Gen:   Awake, no distress   Resp:  Normal effort  MSK:   Moves extremities without difficulty  Other:    Medical Decision Making  Medically screening exam initiated at 7:19 PM.  Appropriate orders placed.  [name] was informed that the remainder of the evaluation will be completed by another provider, this initial triage assessment does not replace that evaluation, and the importance of remaining in the ED until their evaluation is complete.  Medical clearance labs   [name] 01/08/23 1920

## 2023-01-08 NOTE — ED Notes (Signed)
Refused EKG

## 2023-01-08 NOTE — ED Provider Notes (Addendum)
[place] EMERGENCY DEPARTMENT AT [facility] Provider Note   CSN: [id number] Arrival date & time: 01/08/23  1909     History  Chief Complaint  Patient presents with   Ingestion   Suicidal    [name] is a 32 y.o. female.  32 year old female with history of psychiatric disorders who is well-known to me presents after ingesting 12 Tylenol approximately an hour and a half prior to arrival.  Patient states that she was upset with her mother due to the fact that she was not allowed to see a dog.  Took 12 Tylenol tablets.  Denies any other coingestions.  Did have emesis afterwards.  States that her mother called her and states that she will be able to see the dog.  States that she feels better at this time.  Patient has multiple contacts with the healthcare system around mental illness.  She denies responding to any internal stimuli.  States she no longer feels suicidal.       Home Medications Prior to Admission medications   Medication Sig Start Date End Date Taking? Authorizing Provider  amLODipine (NORVASC) 10 MG tablet Take 10 mg by mouth daily.    [provider]  benztropine (COGENTIN) 0.5 MG tablet Take 1 tablet (0.5 mg total) by mouth 2 (two) times daily. For eps 04/14/22   Nwoko, Nicole Kindred I, NP  busPIRone (BUSPAR) 10 MG tablet Take 1 tablet (10 mg total) by mouth 3 (three) times daily. 01/03/23   Thomes Lolling, NP  gabapentin (NEURONTIN) 100 MG capsule Take 1 capsule (100 mg total) by mouth 3 (three) times daily. For agitation 04/14/22   Armandina Stammer I, NP  haloperidol (HALDOL) 10 MG tablet Take 1 tablet (10 mg total) by mouth at bedtime. 09/29/22   Ardis Hughs, NP  insulin aspart protamine- aspart (NOVOLOG MIX 70/30) (70-30) 100 UNIT/ML injection Inject 20 Units into the skin 2 (two) times daily with a meal.    [provider]  lithium carbonate (ESKALITH) 450 MG ER tablet Take 1 tablet (450 mg total) by mouth at bedtime. For mood  stabilization 04/14/22   Armandina Stammer I, NP  metoprolol tartrate (LOPRESSOR) 25 MG tablet Take 25 mg by mouth 2 (two) times daily.    [provider]  ondansetron (ZOFRAN-ODT) 4 MG disintegrating tablet 4mg  ODT q4 hours prn nausea/vomit Patient taking differently: Take 4 mg by mouth every 4 (four) hours as needed for nausea or vomiting. 12/19/22   Melene Plan, DO  traZODone (DESYREL) 50 MG tablet Take 1 tablet (50 mg total) by mouth at bedtime as needed for sleep. Patient taking differently: Take 50 mg by mouth at bedtime. 04/14/22   Armandina Stammer I, NP  venlafaxine XR (EFFEXOR-XR) 150 MG 24 hr capsule Take 150 mg by mouth daily with breakfast.    [provider]      Allergies    Wellbutrin [bupropion], Omnipaque [iohexol], Penicillins, Atarax [hydroxyzine], Contrast media [iodinated contrast media], and Cymbalta [duloxetine hcl]    Review of Systems   Review of Systems  All other systems reviewed and are negative.   Physical Exam Updated Vital Signs BP (!) 142/95 (BP Location: Left Arm)   Pulse 98   Temp 98.5 F (36.9 C) (Oral)   Resp 16   SpO2 97%  Physical Exam Vitals and nursing note reviewed.  Constitutional:      General: She is not in acute distress.    Appearance: Normal appearance. She is well-developed.  She is not toxic-appearing.  HENT:     Head: Normocephalic and atraumatic.  Eyes:     General: Lids are normal.     Conjunctiva/sclera: Conjunctivae normal.     Pupils: Pupils are equal, round, and reactive to light.  Neck:     Thyroid: No thyroid mass.     Trachea: No tracheal deviation.  Cardiovascular:     Rate and Rhythm: Normal rate and regular rhythm.     Heart sounds: Normal heart sounds. No murmur heard.    No gallop.  Pulmonary:     Effort: Pulmonary effort is normal. No respiratory distress.     Breath sounds: Normal breath sounds. No stridor. No decreased breath sounds, wheezing, rhonchi or rales.  Abdominal:     General: There is no  distension.     Palpations: Abdomen is soft.     Tenderness: There is no abdominal tenderness. There is no rebound.  Musculoskeletal:        General: No tenderness. Normal range of motion.     Cervical back: Normal range of motion and neck supple.  Skin:    General: Skin is warm and dry.     Findings: No abrasion or rash.  Neurological:     Mental Status: She is alert and oriented to person, place, and time. Mental status is at baseline.     GCS: GCS eye subscore is 4. GCS verbal subscore is 5. GCS motor subscore is 6.     Cranial Nerves: Cranial nerves are intact. No cranial nerve deficit.     Sensory: No sensory deficit.     Motor: Motor function is intact.  Psychiatric:        Attention and Perception: Attention normal.        Mood and Affect: Affect is labile.        Speech: Speech normal.        Behavior: Behavior normal.        Thought Content: Thought content does not include homicidal or suicidal ideation.     ED Results / Procedures / Treatments   Labs (all labs ordered are listed, but only abnormal results are displayed) Labs Reviewed  COMPREHENSIVE METABOLIC PANEL  ETHANOL  RAPID URINE DRUG SCREEN, HOSP PERFORMED  CBC WITH DIFFERENTIAL/PLATELET  HCG, SERUM, QUALITATIVE  ACETAMINOPHEN LEVEL  SALICYLATE LEVEL    EKG None  Radiology No results found.  Procedures Procedures    Medications Ordered in ED Medications  LORazepam (ATIVAN) tablet 2 mg (has no administration in time range)    ED Course/ Medical Decision Making/ A&P                                 Medical Decision Making Amount and/or Complexity of Data Reviewed Labs: ordered.  Risk Prescription drug management.   Patient to have initial Tylenol level as well as 4-hour Tylenol.  This seems to be a behavioral history as opposed to a true suicide attempt.  Patient will be allowed to discharge to home as long as the second Tylenol level was within normal limits.  Will sign out to next  provider        Final Clinical Impression(s) / ED Diagnoses Final diagnoses:  None    Rx / DC Orders ED Discharge Orders     None         Lorre Nick, MD 01/08/23 2203    Lorre Nick, MD 01/08/23 [phone]

## 2023-01-09 DIAGNOSIS — T50902A Poisoning by unspecified drugs, medicaments and biological substances, intentional self-harm, initial encounter: Secondary | ICD-10-CM | POA: Diagnosis not present

## 2023-01-09 DIAGNOSIS — T50901A Poisoning by unspecified drugs, medicaments and biological substances, accidental (unintentional), initial encounter: Secondary | ICD-10-CM | POA: Diagnosis not present

## 2023-01-09 LAB — ACETAMINOPHEN LEVEL: Acetaminophen (Tylenol), Serum: 18 ug/mL (ref 10–30)

## 2023-01-09 MED ORDER — HALOPERIDOL 5 MG PO TABS: 10.0000 mg | ORAL_TABLET | Freq: Every day | ORAL | Status: DC

## 2023-01-09 MED ORDER — BUSPIRONE HCL 10 MG PO TABS
10.0000 mg | ORAL_TABLET | Freq: Three times a day (TID) | ORAL | Status: DC
  Administered 2023-01-09: 10 mg via ORAL
  Filled 2023-01-09: qty 1

## 2023-01-09 MED ORDER — BENZTROPINE MESYLATE 0.5 MG PO TABS
0.5000 mg | ORAL_TABLET | Freq: Two times a day (BID) | ORAL | Status: DC
  Administered 2023-01-09: 0.5 mg via ORAL
  Filled 2023-01-09: qty 1

## 2023-01-09 MED ORDER — LITHIUM CARBONATE ER 450 MG PO TBCR: 450.0000 mg | EXTENDED_RELEASE_TABLET | Freq: Every day | ORAL | Status: DC

## 2023-01-09 NOTE — ED Notes (Signed)
Pt has 2 belongings bags and 1 black backpack put in locker 28

## 2023-01-09 NOTE — ED Provider Notes (Signed)
Received signout; dispo pending discussion with family as yesterday and overnight team were unable to get collaborative information from mother.   I was able to speak to mother. She reports daughter has voiced that she is suicidal several times the past few days and that in her opinion isn't safe to go home.   Patient this morning has no specific physical complaints. Says she feels better and currently denies SI.  Physical Exam  BP (!) 147/79 (BP Location: Left Arm)   Pulse 98   Temp 97.9 F (36.6 C) (Oral)   Resp 14   SpO2 98%   Physical Exam Vitals and nursing note reviewed.  Constitutional:      Appearance: She is obese.  HENT:     Head: Normocephalic.     Nose: Nose normal.     Mouth/Throat:     Mouth: Mucous membranes are moist.  Eyes:     Conjunctiva/sclera: Conjunctivae normal.  Cardiovascular:     Rate and Rhythm: Normal rate and regular rhythm.  Pulmonary:     Effort: Pulmonary effort is normal.  Abdominal:     General: Abdomen is flat. There is no distension.  Musculoskeletal:        General: Normal range of motion.  Skin:    General: Skin is warm.     Capillary Refill: Capillary refill takes less than 2 seconds.  Neurological:     Mental Status: She is alert.  Psychiatric:        Mood and Affect: Mood normal.        Behavior: Behavior normal.     Procedures  Procedures  ED Course / MDM    Medical Decision Making Amount and/or Complexity of Data Reviewed Labs: ordered.  Risk Prescription drug management.   Will have TTS see patient as mother reports patient expressed suicidal several times over the past few days.         Coral Spikes, DO 01/09/23 [phone]

## 2023-01-09 NOTE — ED Notes (Signed)
Patient discharged off unit to home per provider. Patient alert, calm, cooperative and no s/s of distress. Patient discharge information given to and reviewed with patient, patient acknowledged understanding. Belongings given to patient.,  Patient ambulated off unit, escorted by RN. Patient given bus pass for transport.

## 2023-01-09 NOTE — Discharge Summary (Signed)
[facility] ED Discharge  01/09/2023 12:27 PM [name]  MRN:  [id number]  Principal Problem: OD (overdose of drug), intentional self-harm, initial encounter [facility]) Discharge Diagnoses: Principal Problem:   OD (overdose of drug), intentional self-harm, initial encounter [facility])  Clinical Impression:  Final diagnoses:  Intentional overdose, initial encounter [facility])   Subjective: [name] was brought in to the ER by EMS who found her on the street after she ingested 12 tablets of Tylenol of unknown strength yesterday evening.   Patient is well known to this ER and frequents the ER.  She is now associated with [facility] ACT team. Patient was seen awake, alert and oriented x 5.  She presented a bright and happy affect and denies she is depressed today.  Patient admits ingesting tylenol but not as a suicide attempt but to get her mother angry and distressed.  Patient has long hx of mental illness and she frequents the ER often.  She reports that her mother got her angry by refusing to let her see her new poppy.  Patient became upset and revenged on her mother by taking the tylenol tablets.  Patient today denied SI/HI/AVH.  Patient states he has been stable and doing well since he got back with his ACT team.  They visits her three times a week and give her Medications.  Patient reports eight hour sleep and appetite is great.  Patient also states her mother is supportive and she only became upset because mother did not allow her see the new poppy.  Now mother will bring the poppy to her apartment to see and hold.   Female, morbidly obese seen this morning after she ingested 12 tablets of unknown strength tylenol last evening.  This was not a suicide attempt she says but a revenge because he mother denied her seeing her new poppy.  Patient today vehemently denied SI/HI/AVH.  Patient states she is happy with her ACT team staff and that they visit her three times a week.  Mother will bring the  poppy to her Monday or tomorrow. Provider called her mother -[name] who verified the information given by patient.  She plans to bring the Poppy to patient tomorrow or Monday.  She also states that her daughter is not suicidal but was reacting to her refusing to allow her see the poppy.  Mother plans to spend time with her tomorrow or Monday but will be calling her often before then. Patient and provider reviewed safety plans-call 911 or [phone] for Mental health crisis.  Go to [facility] health facility for mental health care and counseling.  Call your ACT team for any emergency. Discharge was reviewed with DR Clovis Riley, Psychiatrist who is in agreement to discharge patient.  Patient is Psychiatrically cleared.  ED Assessment Time Calculation: Start Time: 1152 Stop Time: 1227 Total Time in Minutes (Assessment Completion): 35   Past Psychiatric History: Borderline intellectual disability, learning disability, OCD,  GAD, MDD.  Suicide attempts by OD x 3 times.  Receives care from [facility] ACT team.  Patient frequents the ER. Past Medical History:  Past Medical History:  Diagnosis Date   Anxiety    Bipolar 1 disorder (HCC)    Cognitive deficits    Depression    Diabetes mellitus without complication (HCC)    Hypertension    Mental disorder    Mental health disorder    Obesity     Past Surgical History:  Procedure Laterality Date   CESAREAN SECTION  CESAREAN SECTION N/A 04/25/2013   Procedure: REPEAT CESAREAN SECTION;  Surgeon: Catalina Antigua, MD;  Location: [facility];  Service: Obstetrics;  Laterality: N/A;   MASS EXCISION N/A 06/03/2012   Procedure: EXCISION MASS;  Surgeon: Osborn Coho, MD;  Location: [facility];  Service: ENT;  Laterality: N/A;  Excision uvula mass   TONSILLECTOMY N/A 06/03/2012   Procedure: TONSILLECTOMY;  Surgeon: Osborn Coho, MD;  Location: [facility];  Service: ENT;  Laterality: N/A;   TONSILLECTOMY      Family History:  Family History  Problem Relation Age of Onset   Hypertension Mother    Diabetes Father    Family Psychiatric  History: Denies Social History:  Social History   Substance and Sexual Activity  Alcohol Use Not Currently     Social History   Substance and Sexual Activity  Drug Use Yes   Types: "Crack" cocaine, Other-see comments, Marijuana    Social History   Socioeconomic History   Marital status: Single    Spouse name: Not on file   Number of children: Not on file   Years of education: Not on file   Highest education level: Not on file  Occupational History   Not on file  Tobacco Use   Smoking status: Every Day    Current packs/day: 1.00    Types: Cigars, Cigarettes   Smokeless tobacco: Never   Tobacco comments:    Pt declined  Vaping Use   Vaping status: Never Used  Substance and Sexual Activity   Alcohol use: Not Currently   Drug use: Yes    Types: "Crack" cocaine, Other-see comments, Marijuana   Sexual activity: Not Currently    Birth control/protection: None    Comment: occasionally  Other Topics Concern   Not on file  Social History Narrative   ** Merged History Encounter **       Social Determinants of Health   Financial Resource Strain: Not on file  Food Insecurity: No Food Insecurity (04/10/2022)   Hunger Vital Sign    Worried About Running Out of Food in the Last Year: Never true    Ran Out of Food in the Last Year: Never true  Transportation Needs: No Transportation Needs (04/10/2022)   PRAPARE - Administrator, Civil Service (Medical): No    Lack of Transportation (Non-Medical): No  Physical Activity: Not on file  Stress: Not on file  Social Connections: Not on file    Tobacco Cessation:  N/A, patient does not currently use tobacco products  Current Medications: Current Facility-Administered Medications  Medication Dose Route Frequency Provider Last Rate Last Admin   benztropine (COGENTIN) tablet 0.5 mg   0.5 mg Oral BID Cardama, Amadeo Garnet, MD   0.5 mg at 01/09/23 1014   busPIRone (BUSPAR) tablet 10 mg  10 mg Oral TID Nira Conn, MD   10 mg at 01/09/23 1014   haloperidol (HALDOL) tablet 10 mg  10 mg Oral QHS Cardama, Amadeo Garnet, MD       lithium carbonate (ESKALITH) ER tablet 450 mg  450 mg Oral QHS Cardama, Amadeo Garnet, MD       Current Outpatient Medications  Medication Sig Dispense Refill   amLODipine (NORVASC) 10 MG tablet Take 10 mg by mouth daily.     benztropine (COGENTIN) 0.5 MG tablet Take 1 tablet (0.5 mg total) by mouth 2 (two) times daily. For eps 60 tablet 0   gabapentin (NEURONTIN) 100 MG capsule Take 1 capsule (  100 mg total) by mouth 3 (three) times daily. For agitation 90 capsule 0   haloperidol (HALDOL) 10 MG tablet Take 1 tablet (10 mg total) by mouth at bedtime.     insulin aspart protamine- aspart (NOVOLOG MIX 70/30) (70-30) 100 UNIT/ML injection Inject 20 Units into the skin 2 (two) times daily with a meal.     lithium carbonate (ESKALITH) 450 MG ER tablet Take 1 tablet (450 mg total) by mouth at bedtime. For mood stabilization (Patient taking differently: Take 450 mg by mouth 2 (two) times daily. For mood stabilization) 30 tablet 0   metoprolol tartrate (LOPRESSOR) 25 MG tablet Take 25 mg by mouth 2 (two) times daily.     traZODone (DESYREL) 150 MG tablet Take 150 mg by mouth at bedtime.     venlafaxine XR (EFFEXOR-XR) 150 MG 24 hr capsule Take 150 mg by mouth daily with breakfast.     busPIRone (BUSPAR) 10 MG tablet Take 1 tablet (10 mg total) by mouth 3 (three) times daily. (Patient not taking: Reported on 01/09/2023) 90 tablet 0   ondansetron (ZOFRAN-ODT) 4 MG disintegrating tablet 4mg  ODT q4 hours prn nausea/vomit (Patient not taking: Reported on 01/09/2023) 20 tablet 0   PTA Medications: (Not in a hospital admission)   [place] Scale:  Flowsheet Row ED from 01/08/2023 in [facility] Emergency Department at [facility] ED from 01/06/2023 in  [facility] ED from 01/02/2023 in [facility]  C-SSRS RISK CATEGORY High Risk High Risk High Risk       Musculoskeletal: Strength & Muscle Tone: within normal limits Gait & Station: normal Patient leans: Front  Psychiatric Specialty Exam: Presentation  General Appearance:  Casual; Well Groomed; Other (comment) (Morbidly obese)  Eye Contact: Good  Speech: Clear and Coherent; Normal Rate  Speech Volume: Normal  Handedness: Right   Mood and Affect  Mood: Euthymic  Affect: Congruent   Thought Process  Thought Processes: Coherent; Goal Directed  Descriptions of Associations:Intact  Orientation:Full (Time, Place and Person)  Thought Content:Logical  History of Schizophrenia/Schizoaffective disorder:Yes  Duration of Psychotic Symptoms:Greater than six months  Hallucinations:Hallucinations: None  Ideas of Reference:None  Suicidal Thoughts:Suicidal Thoughts: No  Homicidal Thoughts:Homicidal Thoughts: No   Sensorium  Memory: Immediate Good; Recent Good; Remote Good  Judgment: Intact  Insight: Present   Executive Functions  Concentration: Good  Attention Span: Good  Recall: Good  Fund of Knowledge: Good  Language: Good   Psychomotor Activity  Psychomotor Activity: Psychomotor Activity: Normal   Assets  Assets: Communication Skills; Desire for Improvement; Social Support; Housing; Physical Health   Sleep  Sleep: Sleep: Good    Physical Exam: Physical Exam Vitals and nursing note reviewed.  Constitutional:      Appearance: She is obese.  HENT:     Head: Normocephalic.     Nose: Nose normal.  Cardiovascular:     Rate and Rhythm: Normal rate and regular rhythm.  Pulmonary:     Effort: Pulmonary effort is normal.  Musculoskeletal:        General: Normal range of motion.     Cervical back: Normal range of motion.  Skin:    General: Skin is dry.  Neurological:      Mental Status: She is alert and oriented to person, place, and time.  Psychiatric:        Attention and Perception: Attention and perception normal.        Mood and Affect: Mood and affect normal.  Speech: Speech normal.        Behavior: Behavior normal.        Thought Content: Thought content normal.        Cognition and Memory: Cognition and memory normal.        Judgment: Judgment is impulsive.    Review of Systems  Constitutional: Negative.   HENT: Negative.    Eyes: Negative.   Respiratory: Negative.    Cardiovascular: Negative.   Gastrointestinal: Negative.   Genitourinary: Negative.   Musculoskeletal: Negative.   Skin: Negative.   Neurological: Negative.   Endo/Heme/Allergies: Negative.   Psychiatric/Behavioral: Negative.     Blood pressure (!) 147/79, pulse 98, temperature 97.9 F (36.6 C), temperature source Oral, resp. rate 14, SpO2 98%. There is no height or weight on file to calculate BMI.   Demographic Factors:  Adolescent or young adult, Living alone, and Unemployed  Loss Factors: NA  Historical Factors: Prior suicide attempts and Impulsivity  Risk Reduction Factors:   Religious beliefs about death and Positive social support  Continued Clinical Symptoms:  Bipolar Disorder:   Mixed State More than one psychiatric diagnosis Previous Psychiatric Diagnoses and Treatments  Cognitive Features That Contribute To Risk:  None    Suicide Risk:  Mild:  Suicidal ideation of limited frequency, intensity, duration, and specificity.  There are no identifiable plans, no associated intent, mild dysphoria and related symptoms, good self-control (both objective and subjective assessment), few other risk factors, and identifiable protective factors, including available and accessible social support.    Plan Of Care/Follow-up recommendations:  Activity:  as tolerated Diet:  Low Carb diet  Medical Decision Making: Patient denies SI/head injury/AVH.  She  is no distress.  Patient vehemently denies taking Tylenol to kill herself.  Patient is connected to her ACT team and they are supportive.  Her Mother Britta Mccreedy is also supportive.  Patient is Psychiatrically cleared.  Provider will inform ACT team of the OD on Monday.  Patient is Psychiatrically cleared.  Disposition: Psychiatrically cleared.  Earney Navy, NP-PMHNP-BC 01/09/2023, 12:27 PM

## 2023-01-09 NOTE — ED Provider Notes (Signed)
I assumed care of this patient from previous provider.  Please see their note for further details of history, exam, and MDM.   Briefly patient is a 32 y.o. female who presented after ingesting Tylenol.  Initial Tylenol level at 55.  Plan to get 4-hour Tylenol.  Dr. Freida Busman did not feel this was intentional self-harm.  He attempted to call mother for collateral information and to establish safety plan.  No answer.   4-hour Tylenol cleared.  4:04 AM I attempted to call mother once more but no answer.  6:50 AM No answer on attempted call to mother and ACCT team.   Patient care turned over to oncoming provider. Patient case and results discussed in detail; please see their note for further ED managment.   Pending contact with family to establish safety plan.   Nira Conn, MD 01/09/23 [phone]

## 2023-01-09 NOTE — Progress Notes (Signed)
TOC CSW received consult for mental health  resources. CSW has attached resources to pt's AVS. TOC sign off  Valentina Shaggy.Milon Dethloff, MSW, LCSWA [facility]  Transitions of Care Clinical Social Worker I Direct Dial: [phone] Fax: [phone] [email or web address]

## 2023-01-16 ENCOUNTER — Other Ambulatory Visit: Payer: Self-pay

## 2023-01-16 ENCOUNTER — Emergency Department (HOSPITAL_COMMUNITY)
Admission: EM | Admit: 2023-01-16 | Discharge: 2023-01-16 | Discharge status: Against Medical Advice | Payer: 59 | Attending: Emergency Medicine | Admitting: Emergency Medicine

## 2023-01-16 DIAGNOSIS — L299 Pruritus, unspecified: Secondary | ICD-10-CM | POA: Diagnosis not present

## 2023-01-16 DIAGNOSIS — I10 Essential (primary) hypertension: Secondary | ICD-10-CM | POA: Diagnosis not present

## 2023-01-16 DIAGNOSIS — R739 Hyperglycemia, unspecified: Secondary | ICD-10-CM | POA: Diagnosis not present

## 2023-01-16 DIAGNOSIS — N898 Other specified noninflammatory disorders of vagina: Secondary | ICD-10-CM | POA: Insufficient documentation

## 2023-01-16 DIAGNOSIS — Z5321 Procedure and treatment not carried out due to patient leaving prior to being seen by health care provider: Secondary | ICD-10-CM | POA: Diagnosis not present

## 2023-01-16 LAB — PREGNANCY, URINE: Preg Test, Ur: NEGATIVE

## 2023-01-16 LAB — URINALYSIS, ROUTINE W REFLEX MICROSCOPIC
Bacteria, UA: NONE SEEN
Bilirubin Urine: NEGATIVE
Glucose, UA: >=500 mg/dL — AB
Hgb urine dipstick: NEGATIVE
Ketones, ur: NEGATIVE mg/dL
Leukocytes,Ua: NEGATIVE
Nitrite: NEGATIVE
Protein, ur: NEGATIVE mg/dL
Specific Gravity, Urine: 1.015 (ref 1.005–1.030)
pH: 5 (ref 5.0–8.0)

## 2023-01-16 NOTE — ED Triage Notes (Signed)
Pt reporting vaginal burning/pain/itching for 3 days. White vaginal discharge.

## 2023-01-16 NOTE — ED Triage Notes (Signed)
PER ems report...Marland KitchenMarland KitchenPt arrives via [facility]. Reporting vaginal burning and itching, reports has not showered in about a month. 164/92, hr 91, 99 RA, cbg 369, she did have her insulin last night.

## 2023-01-17 ENCOUNTER — Emergency Department (HOSPITAL_COMMUNITY)
Admission: EM | Admit: 2023-01-17 | Discharge: 2023-01-17 | Disposition: A | Discharge status: Home / Self Care | Payer: 59 | Attending: Emergency Medicine | Admitting: Emergency Medicine

## 2023-01-17 ENCOUNTER — Other Ambulatory Visit: Payer: Self-pay

## 2023-01-17 ENCOUNTER — Encounter (HOSPITAL_COMMUNITY): Payer: Self-pay

## 2023-01-17 DIAGNOSIS — N760 Acute vaginitis: Secondary | ICD-10-CM | POA: Diagnosis not present

## 2023-01-17 DIAGNOSIS — B9689 Other specified bacterial agents as the cause of diseases classified elsewhere: Secondary | ICD-10-CM | POA: Diagnosis not present

## 2023-01-17 DIAGNOSIS — E1165 Type 2 diabetes mellitus with hyperglycemia: Secondary | ICD-10-CM | POA: Diagnosis not present

## 2023-01-17 DIAGNOSIS — Z794 Long term (current) use of insulin: Secondary | ICD-10-CM | POA: Insufficient documentation

## 2023-01-17 DIAGNOSIS — N898 Other specified noninflammatory disorders of vagina: Secondary | ICD-10-CM | POA: Diagnosis not present

## 2023-01-17 DIAGNOSIS — B3731 Acute candidiasis of vulva and vagina: Secondary | ICD-10-CM

## 2023-01-17 LAB — URINALYSIS, ROUTINE W REFLEX MICROSCOPIC
Bilirubin Urine: NEGATIVE
Glucose, UA: >=500 mg/dL — AB
Ketones, ur: 5 mg/dL — AB
Nitrite: NEGATIVE
Protein, ur: NEGATIVE mg/dL
Specific Gravity, Urine: 1.022 (ref 1.005–1.030)
pH: 5 (ref 5.0–8.0)

## 2023-01-17 LAB — WET PREP, GENITAL
Sperm: NONE SEEN
Trich, Wet Prep: NONE SEEN
WBC, Wet Prep HPF POC: <10 (ref <10)
Yeast Wet Prep HPF POC: NONE SEEN

## 2023-01-17 LAB — CBG MONITORING, ED: Glucose-Capillary: 292 mg/dL — ABNORMAL HIGH (ref 70–99)

## 2023-01-17 MED ORDER — METRONIDAZOLE 500 MG PO TABS: 500.0000 mg | ORAL_TABLET | Freq: Two times a day (BID) | ORAL | 0 refills | Status: DC

## 2023-01-17 MED ORDER — FLUCONAZOLE 200 MG PO TABS
200.0000 mg | ORAL_TABLET | Freq: Every day | ORAL | 0 refills | Status: AC
Start: 2023-01-17 — End: 2023-01-20

## 2023-01-17 NOTE — ED Provider Notes (Signed)
[place] EMERGENCY DEPARTMENT AT [facility] Provider Note   CSN: [id number] Arrival date & time: 01/17/23  4098     History  Chief Complaint  Patient presents with   Vaginal Itching    [name] is a 32 y.o. female past medical history of anxiety, depression, bipolar disorder, diabetes presenting to the emergency room today with 2 days of vaginal itching and burning when she pees.  Patient is sexually active with multiple partners and concerned for sexually transmitted disease.  Patient does report discharge discharge is white and thick.  Denies any suprapubic pain, abdominal pain or other associated symptoms.  Patient's blood sugar has been running in 200s to 300s, denies following up with primary care doctor regularly.   Vaginal Itching       Home Medications Prior to Admission medications   Medication Sig Start Date End Date Taking? Authorizing Provider  amLODipine (NORVASC) 10 MG tablet Take 10 mg by mouth daily.    [provider]  benztropine (COGENTIN) 0.5 MG tablet Take 1 tablet (0.5 mg total) by mouth 2 (two) times daily. For eps 04/14/22   Nwoko, Nicole Kindred I, NP  busPIRone (BUSPAR) 10 MG tablet Take 1 tablet (10 mg total) by mouth 3 (three) times daily. Patient not taking: Reported on 01/09/2023 01/03/23   Phebe Colla A, NP  gabapentin (NEURONTIN) 100 MG capsule Take 1 capsule (100 mg total) by mouth 3 (three) times daily. For agitation 04/14/22   Armandina Stammer I, NP  haloperidol (HALDOL) 10 MG tablet Take 1 tablet (10 mg total) by mouth at bedtime. 09/29/22   Ardis Hughs, NP  insulin aspart protamine- aspart (NOVOLOG MIX 70/30) (70-30) 100 UNIT/ML injection Inject 20 Units into the skin 2 (two) times daily with a meal.    [provider]  lithium carbonate (ESKALITH) 450 MG ER tablet Take 1 tablet (450 mg total) by mouth at bedtime. For mood stabilization Patient taking differently: Take 450 mg by mouth 2 (two) times daily. For mood  stabilization 04/14/22   Armandina Stammer I, NP  metoprolol tartrate (LOPRESSOR) 25 MG tablet Take 25 mg by mouth 2 (two) times daily.    [provider]  ondansetron (ZOFRAN-ODT) 4 MG disintegrating tablet 4mg  ODT q4 hours prn nausea/vomit Patient not taking: Reported on 01/09/2023 12/19/22   Melene Plan, DO  traZODone (DESYREL) 150 MG tablet Take 150 mg by mouth at bedtime.    [provider]  venlafaxine XR (EFFEXOR-XR) 150 MG 24 hr capsule Take 150 mg by mouth daily with breakfast.    [provider]      Allergies    Wellbutrin [bupropion], Omnipaque [iohexol], Penicillins, Atarax [hydroxyzine], Buspar [buspirone], Contrast media [iodinated contrast media], and Cymbalta [duloxetine hcl]    Review of Systems   Review of Systems  Physical Exam Updated Vital Signs BP (!) 161/99 (BP Location: Left Arm)   Pulse 91   Temp 98.2 F (36.8 C) (Oral)   Resp 16   Ht 5\' 7"  (1.702 m)   Wt 108 kg   SpO2 98%   BMI 37.29 kg/m  Physical Exam Vitals and nursing note reviewed.  Constitutional:      General: She is not in acute distress.    Appearance: She is not toxic-appearing.  HENT:     Head: Normocephalic and atraumatic.  Eyes:     General: No scleral icterus.    Conjunctiva/sclera: Conjunctivae normal.  Cardiovascular:     Rate and Rhythm: Normal rate  and regular rhythm.     Pulses: Normal pulses.     Heart sounds: Normal heart sounds.  Pulmonary:     Effort: Pulmonary effort is normal. No respiratory distress.     Breath sounds: Normal breath sounds.  Abdominal:     General: Abdomen is flat. Bowel sounds are normal. There is no distension.     Palpations: Abdomen is soft. There is no mass.     Tenderness: There is no abdominal tenderness.  Musculoskeletal:     Right lower leg: No edema.     Left lower leg: No edema.  Skin:    General: Skin is warm and dry.     Capillary Refill: Capillary refill takes less than 2 seconds.     Findings: No lesion.   Neurological:     General: No focal deficit present.     Mental Status: She is alert and oriented to person, place, and time. Mental status is at baseline.     ED Results / Procedures / Treatments   Labs (all labs ordered are listed, but only abnormal results are displayed) Labs Reviewed  URINALYSIS, ROUTINE W REFLEX MICROSCOPIC - Abnormal; Notable for the following components:      Result Value   Color, Urine STRAW (*)    APPearance HAZY (*)    Glucose, UA >=500 (*)    Hgb urine dipstick SMALL (*)    Ketones, ur 5 (*)    Leukocytes,Ua MODERATE (*)    Bacteria, UA RARE (*)    All other components within normal limits  CBG MONITORING, ED - Abnormal; Notable for the following components:   Glucose-Capillary 292 (*)    All other components within normal limits    EKG None  Radiology No results found.  Procedures Procedures    Medications Ordered in ED Medications - No data to display  ED Course/ Medical Decision Making/ A&P                                 Medical Decision Making Amount and/or Complexity of Data Reviewed Labs: ordered.  Risk Prescription drug management.   [name] 32 y.o. presented today for suprapubic pain. Working Ddx: includes, but not limited to, gastroenteritis, colitis, appendicitis, pancreatitis, nephrolithiasis, UTI, pyelonephritis, ectopic pregnancy, PID, ovarian  R/o DDx: These are considered less likely than current impression due to history of present illness, physical exam, labs/imaging findings.  Review of prior external notes: None   Pmhx:   Unique Tests and My Interpretation:  Wet prep: G&C:  Imaging:  None  Problem List / ED Course / Critical interventions / Medication management  Pelvic exam was performed in emergency room with chaperone present.  Pelvic exam showed thin white discharge consistent with BV findings.  No cervical motion tenderness.  Otherwise physical exam unremarkable.  Area and chlamydia  results are in process.  Urinalysis shows high level of glucose, some leukocytes bacteria is rare.  I feel that her symptoms are due to current BV infection as well as clinical findings associated with candidiasis infection.  Will treat for BV and candidiasis.  Patient does not have any cervical motion tenderness on exam so no concern at this time for pelvic inflammatory disease, however given patient's concern for STI discuess empiric treatment, patient did not wanted and leave to catch the bus, thus decided she did not want empiric treatment. Reevaluation of the patient after these medicines showed that  the patient improved Patients vitals assessed. Upon arrival patient is hemodynamically stable.  I have reviewed the patients home medicines and have made adjustments as needed   Consult: None  Plan: Patient would like to wait for gonorrhea and chlamydia results prior to treatment as she would like to leave to catch bus ride home.  Will not empirically treat for gonorrhea and chlamydia.  Sent medications for BV, candidiasis.  F/u w/ PCP in 2-3d to ensure resolution of sx and to address elevated glucose Patient was given return precautions. Patient stable for discharge at this time.  Patient educated on current sx/dx and verbalized understanding of plan. Return to ER w/ new or worsening sx.          Final Clinical Impression(s) / ED Diagnoses Final diagnoses:  None    Rx / DC Orders ED Discharge Orders     None         Raford Pitcher Evalee Jefferson 01/17/23 1551    Lorre Nick, MD 01/18/23 [phone]

## 2023-01-17 NOTE — Discharge Instructions (Addendum)
You were seen in the emergency room today and found to have bacterial vaginosis and yeast infection.  I have sent medications to your pharmacy.  Take as prescribed.  If your gonorrhea and chlamydia comes back positive you will get a phone call from the hospital. Schedule follow-up with primary care.

## 2023-01-17 NOTE — ED Notes (Signed)
Patient left to catch bus before discharge papers could be completed. Patient aware of where Rx are being sent. Provider notified.

## 2023-01-17 NOTE — ED Triage Notes (Signed)
Pt states she is experiencing vaginal burning and itch with discharge Pt requesting glucose check

## 2023-01-18 LAB — GC/CHLAMYDIA PROBE AMP (~~LOC~~) NOT AT ARMC
Chlamydia: NEGATIVE
Comment: NEGATIVE
Comment: NORMAL
Neisseria Gonorrhea: NEGATIVE

## 2023-01-21 ENCOUNTER — Emergency Department (HOSPITAL_COMMUNITY)
Admission: EM | Admit: 2023-01-21 | Discharge: 2023-01-21 | Disposition: A | Discharge status: Home / Self Care | Payer: 59 | Attending: Emergency Medicine | Admitting: Emergency Medicine

## 2023-01-21 ENCOUNTER — Encounter (HOSPITAL_COMMUNITY): Payer: Self-pay | Admitting: Emergency Medicine

## 2023-01-21 ENCOUNTER — Emergency Department (HOSPITAL_COMMUNITY): Payer: 59

## 2023-01-21 ENCOUNTER — Other Ambulatory Visit: Payer: Self-pay

## 2023-01-21 DIAGNOSIS — Z794 Long term (current) use of insulin: Secondary | ICD-10-CM | POA: Diagnosis not present

## 2023-01-21 DIAGNOSIS — E119 Type 2 diabetes mellitus without complications: Secondary | ICD-10-CM | POA: Diagnosis not present

## 2023-01-21 DIAGNOSIS — M25562 Pain in left knee: Secondary | ICD-10-CM | POA: Diagnosis not present

## 2023-01-21 DIAGNOSIS — R739 Hyperglycemia, unspecified: Secondary | ICD-10-CM | POA: Diagnosis not present

## 2023-01-21 DIAGNOSIS — M25561 Pain in right knee: Secondary | ICD-10-CM | POA: Insufficient documentation

## 2023-01-21 DIAGNOSIS — M7989 Other specified soft tissue disorders: Secondary | ICD-10-CM | POA: Diagnosis not present

## 2023-01-21 DIAGNOSIS — M1711 Unilateral primary osteoarthritis, right knee: Secondary | ICD-10-CM | POA: Diagnosis not present

## 2023-01-21 MED ORDER — DICLOFENAC SODIUM 1 % EX GEL: 4.0000 g | Freq: Four times a day (QID) | CUTANEOUS | 0 refills | Status: DC

## 2023-01-21 NOTE — Discharge Instructions (Signed)
Please follow-up with your primary care provider.  You may use Voltaren gel on your knees as directed.

## 2023-01-21 NOTE — ED Provider Notes (Signed)
[place] EMERGENCY DEPARTMENT AT [facility] Provider Note   CSN: [id number] Arrival date & time: 01/21/23  0348     History  Chief Complaint  Patient presents with   Knee Pain    [name] is a 32 y.o. female.  Patient presents to the emergency department via EMS complaining of bilateral knee pain.  She reports that symptoms started weeks ago and states that she has been seen for the same in the past with nothing done to resolve her pain.  She is requesting x-rays upon arrival.  She denies any falls or injuries to the area.  Chart review shows no previous knee imaging.  Past medical history significant for learning disability, type II DM, generalized anxiety disorder, bipolar 1 disorder   Knee Pain      Home Medications Prior to Admission medications   Medication Sig Start Date End Date Taking? Authorizing Provider  diclofenac Sodium (VOLTAREN) 1 % GEL Apply 4 g topically 4 (four) times daily. 01/21/23  Yes Barrie Dunker B, PA-C  amLODipine (NORVASC) 10 MG tablet Take 10 mg by mouth daily.    [provider]  benztropine (COGENTIN) 0.5 MG tablet Take 1 tablet (0.5 mg total) by mouth 2 (two) times daily. For eps 04/14/22   Nwoko, Nicole Kindred I, NP  busPIRone (BUSPAR) 10 MG tablet Take 1 tablet (10 mg total) by mouth 3 (three) times daily. Patient not taking: Reported on 01/09/2023 01/03/23   Phebe Colla A, NP  gabapentin (NEURONTIN) 100 MG capsule Take 1 capsule (100 mg total) by mouth 3 (three) times daily. For agitation 04/14/22   Armandina Stammer I, NP  haloperidol (HALDOL) 10 MG tablet Take 1 tablet (10 mg total) by mouth at bedtime. 09/29/22   Ardis Hughs, NP  insulin aspart protamine- aspart (NOVOLOG MIX 70/30) (70-30) 100 UNIT/ML injection Inject 20 Units into the skin 2 (two) times daily with a meal.    [provider]  lithium carbonate (ESKALITH) 450 MG ER tablet Take 1 tablet (450 mg total) by mouth at bedtime. For mood  stabilization Patient taking differently: Take 450 mg by mouth 2 (two) times daily. For mood stabilization 04/14/22   Armandina Stammer I, NP  metoprolol tartrate (LOPRESSOR) 25 MG tablet Take 25 mg by mouth 2 (two) times daily.    [provider]  metroNIDAZOLE (FLAGYL) 500 MG tablet Take 1 tablet (500 mg total) by mouth 2 (two) times daily. 01/17/23   Barrett, Horald Chestnut, PA-C  ondansetron (ZOFRAN-ODT) 4 MG disintegrating tablet 4mg  ODT q4 hours prn nausea/vomit Patient not taking: Reported on 01/09/2023 12/19/22   Melene Plan, DO  traZODone (DESYREL) 150 MG tablet Take 150 mg by mouth at bedtime.    [provider]  venlafaxine XR (EFFEXOR-XR) 150 MG 24 hr capsule Take 150 mg by mouth daily with breakfast.    [provider]      Allergies    Wellbutrin [bupropion], Omnipaque [iohexol], Penicillins, Atarax [hydroxyzine], Buspar [buspirone], Contrast media [iodinated contrast media], and Cymbalta [duloxetine hcl]    Review of Systems   Review of Systems  Physical Exam Updated Vital Signs BP (!) 154/97   Pulse 87   Temp 98.4 F (36.9 C) (Oral)   Resp 16   Ht 5\' 7"  (1.702 m)   Wt 115.7 kg   LMP 01/19/2023   SpO2 100%   BMI 39.94 kg/m  Physical Exam Vitals and nursing note reviewed.  Constitutional:      General: She  is not in acute distress.    Appearance: She is well-developed.  HENT:     Head: Normocephalic and atraumatic.  Eyes:     Conjunctiva/sclera: Conjunctivae normal.  Cardiovascular:     Rate and Rhythm: Normal rate.  Pulmonary:     Effort: Respiratory distress present.  Musculoskeletal:        General: Tenderness (Mild tenderness to palpation of the medial joint line of the right knee) present. No swelling. Normal range of motion.     Cervical back: Neck supple.  Skin:    General: Skin is warm and dry.  Neurological:     Mental Status: She is alert.  Psychiatric:        Mood and Affect: Mood normal.     ED Results / Procedures /  Treatments   Labs (all labs ordered are listed, but only abnormal results are displayed) Labs Reviewed - No data to display  EKG None  Radiology No results found.  Procedures Procedures    Medications Ordered in ED Medications - No data to display  ED Course/ Medical Decision Making/ A&P                                 Medical Decision Making Amount and/or Complexity of Data Reviewed Radiology: ordered.   Patient presents to the emergency department with a chief complaint of bilateral knee pain.  Differential diagnosis includes fracture, dislocation, arthritic changes, others  I reviewed past medical records and found no previous imaging of patient's bilateral knees  I ordered and interpreted imaging including plain films of bilateral knees.  No fracture or dislocation was appreciated.  Joint space grossly preserved bilaterally.  I discussed the findings with the patient.  No clear cause of patient's bilateral knee pain.  With pain being bilateral, no significant swelling, effusion, or warmth, no clinical suspicion of septic arthritis.  Patient plans to follow-up with primary care for further evaluation.  I did advise patient on use of Voltaren gel as needed for inflammation.  Patient requested bus pass but did not wait on discharge paperwork.       Final Clinical Impression(s) / ED Diagnoses Final diagnoses:  Pain in both knees, unspecified chronicity    Rx / DC Orders ED Discharge Orders          Ordered    diclofenac Sodium (VOLTAREN) 1 % GEL  4 times daily        01/21/23 0514              Darrick Grinder, PA-C 01/21/23 4627    Tilden Fossa, MD 01/21/23 [phone]

## 2023-01-21 NOTE — ED Notes (Addendum)
Pt stated that she did not want to wait for her discharge paperwork. Pt stated that the provider was going to discharge her and that she had to go to catch the bus. Pt then asked for a bus pas. Pt was given a bus pass and exited the ED prior to getting discharge paperwork. Pt would not allow staff to re-obtain vitals prior to departure.

## 2023-01-21 NOTE — ED Triage Notes (Signed)
Patient BIB EMS for evaluation of bilateral knee pain.  Reports symptoms started "weeks" ago.  Has been seen for same and "had nothing done."  Requesting "scans and xrays."  No new injuries

## 2023-02-04 ENCOUNTER — Encounter (HOSPITAL_COMMUNITY): Payer: Self-pay

## 2023-02-04 ENCOUNTER — Emergency Department (HOSPITAL_COMMUNITY)
Admission: EM | Admit: 2023-02-04 | Discharge: 2023-02-04 | Discharge status: Against Medical Advice | Payer: 59 | Attending: Emergency Medicine | Admitting: Emergency Medicine

## 2023-02-04 DIAGNOSIS — M25461 Effusion, right knee: Secondary | ICD-10-CM | POA: Diagnosis not present

## 2023-02-04 DIAGNOSIS — N898 Other specified noninflammatory disorders of vagina: Secondary | ICD-10-CM | POA: Insufficient documentation

## 2023-02-04 DIAGNOSIS — Z5321 Procedure and treatment not carried out due to patient leaving prior to being seen by health care provider: Secondary | ICD-10-CM | POA: Insufficient documentation

## 2023-02-04 DIAGNOSIS — M25561 Pain in right knee: Secondary | ICD-10-CM | POA: Diagnosis not present

## 2023-02-04 DIAGNOSIS — R739 Hyperglycemia, unspecified: Secondary | ICD-10-CM | POA: Diagnosis not present

## 2023-02-04 DIAGNOSIS — R609 Edema, unspecified: Secondary | ICD-10-CM | POA: Diagnosis not present

## 2023-02-04 DIAGNOSIS — Z743 Need for continuous supervision: Secondary | ICD-10-CM | POA: Diagnosis not present

## 2023-02-04 NOTE — ED Notes (Signed)
Patient is ambulatory and walked from the triage room to the lobby without pain or distress.

## 2023-02-04 NOTE — ED Triage Notes (Signed)
Patient brought in by EMS. States her right knee is swollen and she has vaginal discharge.

## 2023-02-07 ENCOUNTER — Other Ambulatory Visit: Payer: Self-pay

## 2023-02-07 ENCOUNTER — Emergency Department (HOSPITAL_COMMUNITY)
Admission: EM | Admit: 2023-02-07 | Discharge: 2023-02-07 | Disposition: A | Discharge status: Home / Self Care | Payer: 59 | Attending: Emergency Medicine | Admitting: Emergency Medicine

## 2023-02-07 ENCOUNTER — Encounter (HOSPITAL_COMMUNITY): Payer: Self-pay | Admitting: Pharmacy Technician

## 2023-02-07 DIAGNOSIS — R300 Dysuria: Secondary | ICD-10-CM | POA: Diagnosis not present

## 2023-02-07 DIAGNOSIS — N898 Other specified noninflammatory disorders of vagina: Secondary | ICD-10-CM | POA: Diagnosis not present

## 2023-02-07 LAB — URINALYSIS, ROUTINE W REFLEX MICROSCOPIC
Bacteria, UA: NONE SEEN
Bilirubin Urine: NEGATIVE
Glucose, UA: >=500 mg/dL — AB
Hgb urine dipstick: NEGATIVE
Ketones, ur: NEGATIVE mg/dL
Leukocytes,Ua: NEGATIVE
Nitrite: NEGATIVE
Protein, ur: NEGATIVE mg/dL
Specific Gravity, Urine: 1.028 (ref 1.005–1.030)
pH: 5 (ref 5.0–8.0)

## 2023-02-07 LAB — WET PREP, GENITAL
Clue Cells Wet Prep HPF POC: NONE SEEN
Sperm: NONE SEEN
Trich, Wet Prep: NONE SEEN
WBC, Wet Prep HPF POC: <10 (ref <10)
Yeast Wet Prep HPF POC: NONE SEEN

## 2023-02-07 LAB — PREGNANCY, URINE: Preg Test, Ur: NEGATIVE

## 2023-02-07 NOTE — ED Provider Notes (Signed)
[place] EMERGENCY DEPARTMENT AT [facility] Provider Note   CSN: [id number] Arrival date & time: 02/07/23  1823     History  No chief complaint on file.   [name] is a 32 y.o. female.  32 year old female with history of mental illness presents with vaginal discharge x 3 weeks.  Patient is sexually active.  States she has had multiple STIs in the past.  Notes some dysuria.  Denies any fever or flank pain.  No recent vaginal bleeding.       Home Medications Prior to Admission medications   Medication Sig Start Date End Date Taking? Authorizing Provider  amLODipine (NORVASC) 10 MG tablet Take 10 mg by mouth daily.    [provider]  benztropine (COGENTIN) 0.5 MG tablet Take 1 tablet (0.5 mg total) by mouth 2 (two) times daily. For eps 04/14/22   Nwoko, Nicole Kindred I, NP  busPIRone (BUSPAR) 10 MG tablet Take 1 tablet (10 mg total) by mouth 3 (three) times daily. Patient not taking: Reported on 01/09/2023 01/03/23   Phebe Colla A, NP  diclofenac Sodium (VOLTAREN) 1 % GEL Apply 4 g topically 4 (four) times daily. 01/21/23   Darrick Grinder, PA-C  gabapentin (NEURONTIN) 100 MG capsule Take 1 capsule (100 mg total) by mouth 3 (three) times daily. For agitation 04/14/22   Armandina Stammer I, NP  haloperidol (HALDOL) 10 MG tablet Take 1 tablet (10 mg total) by mouth at bedtime. 09/29/22   Ardis Hughs, NP  insulin aspart protamine- aspart (NOVOLOG MIX 70/30) (70-30) 100 UNIT/ML injection Inject 20 Units into the skin 2 (two) times daily with a meal.    [provider]  lithium carbonate (ESKALITH) 450 MG ER tablet Take 1 tablet (450 mg total) by mouth at bedtime. For mood stabilization Patient taking differently: Take 450 mg by mouth 2 (two) times daily. For mood stabilization 04/14/22   Armandina Stammer I, NP  metoprolol tartrate (LOPRESSOR) 25 MG tablet Take 25 mg by mouth 2 (two) times daily.    [provider]  metroNIDAZOLE (FLAGYL) 500 MG tablet  Take 1 tablet (500 mg total) by mouth 2 (two) times daily. 01/17/23   Barrett, Horald Chestnut, PA-C  ondansetron (ZOFRAN-ODT) 4 MG disintegrating tablet 4mg  ODT q4 hours prn nausea/vomit Patient not taking: Reported on 01/09/2023 12/19/22   Melene Plan, DO  traZODone (DESYREL) 150 MG tablet Take 150 mg by mouth at bedtime.    [provider]  venlafaxine XR (EFFEXOR-XR) 150 MG 24 hr capsule Take 150 mg by mouth daily with breakfast.    [provider]      Allergies    Wellbutrin [bupropion], Omnipaque [iohexol], Penicillins, Atarax [hydroxyzine], Buspar [buspirone], Contrast media [iodinated contrast media], and Cymbalta [duloxetine hcl]    Review of Systems   Review of Systems  All other systems reviewed and are negative.   Physical Exam Updated Vital Signs BP (!) 163/97 (BP Location: Left Arm)   Pulse (!) 112   Temp 98.6 F (37 C) (Oral)   Resp 18   LMP 01/19/2023   SpO2 98%  Physical Exam Vitals and nursing note reviewed. Exam conducted with a chaperone present.  Constitutional:      General: She is not in acute distress.    Appearance: Normal appearance. She is well-developed. She is not toxic-appearing.  HENT:     Head: Normocephalic and atraumatic.  Eyes:     General: Lids are normal.     Conjunctiva/sclera: Conjunctivae  normal.     Pupils: Pupils are equal, round, and reactive to light.  Neck:     Thyroid: No thyroid mass.     Trachea: No tracheal deviation.  Cardiovascular:     Rate and Rhythm: Normal rate and regular rhythm.     Heart sounds: Normal heart sounds. No murmur heard.    No gallop.  Pulmonary:     Effort: Pulmonary effort is normal. No respiratory distress.     Breath sounds: Normal breath sounds. No stridor. No decreased breath sounds, wheezing, rhonchi or rales.  Abdominal:     General: There is no distension.     Palpations: Abdomen is soft.     Tenderness: There is no abdominal tenderness. There is no rebound.  Genitourinary:     Cervix: Discharge present.  Musculoskeletal:        General: No tenderness. Normal range of motion.     Cervical back: Normal range of motion and neck supple.  Skin:    General: Skin is warm and dry.     Findings: No abrasion or rash.  Neurological:     Mental Status: She is alert and oriented to person, place, and time. Mental status is at baseline.     GCS: GCS eye subscore is 4. GCS verbal subscore is 5. GCS motor subscore is 6.     Cranial Nerves: Cranial nerves are intact. No cranial nerve deficit.     Sensory: No sensory deficit.     Motor: Motor function is intact.  Psychiatric:        Attention and Perception: Attention normal.        Speech: Speech normal.        Behavior: Behavior normal.     ED Results / Procedures / Treatments   Labs (all labs ordered are listed, but only abnormal results are displayed) Labs Reviewed  WET PREP, GENITAL  URINALYSIS, ROUTINE W REFLEX MICROSCOPIC  PREGNANCY, URINE  GC/CHLAMYDIA PROBE AMP ([place]) NOT AT [facility]    EKG None  Radiology No results found.  Procedures Procedures    Medications Ordered in ED Medications - No data to display  ED Course/ Medical Decision Making/ A&P                                 Medical Decision Making Amount and/or Complexity of Data Reviewed Labs: ordered.   Patient is urinalysis, Precht test negative.  Wet prep -2.  Will discharge home        Final Clinical Impression(s) / ED Diagnoses Final diagnoses:  None    Rx / DC Orders ED Discharge Orders     None         Lorre Nick, MD 02/07/23 1943

## 2023-02-07 NOTE — Discharge Instructions (Signed)
Your testing here today was negative. Follow-up with your doctor as needed.

## 2023-02-07 NOTE — ED Triage Notes (Signed)
Pt here with reports of burning sensation to her vagina for the last 2-3 weeks. Also endorses frequent urination.

## 2023-02-08 LAB — GC/CHLAMYDIA PROBE AMP (~~LOC~~) NOT AT ARMC
Chlamydia: NEGATIVE
Comment: NEGATIVE
Comment: NORMAL
Neisseria Gonorrhea: NEGATIVE

## 2023-02-22 ENCOUNTER — Emergency Department (HOSPITAL_COMMUNITY)
Admission: EM | Admit: 2023-02-22 | Discharge: 2023-02-22 | Disposition: A | Discharge status: Home / Self Care | Payer: 59 | Attending: Emergency Medicine | Admitting: Emergency Medicine

## 2023-02-22 ENCOUNTER — Other Ambulatory Visit: Payer: Self-pay

## 2023-02-22 ENCOUNTER — Encounter (HOSPITAL_COMMUNITY): Payer: Self-pay

## 2023-02-22 DIAGNOSIS — R1111 Vomiting without nausea: Secondary | ICD-10-CM | POA: Diagnosis not present

## 2023-02-22 DIAGNOSIS — Z20822 Contact with and (suspected) exposure to covid-19: Secondary | ICD-10-CM | POA: Diagnosis not present

## 2023-02-22 DIAGNOSIS — R059 Cough, unspecified: Secondary | ICD-10-CM | POA: Diagnosis not present

## 2023-02-22 DIAGNOSIS — R070 Pain in throat: Secondary | ICD-10-CM | POA: Diagnosis not present

## 2023-02-22 DIAGNOSIS — R739 Hyperglycemia, unspecified: Secondary | ICD-10-CM | POA: Insufficient documentation

## 2023-02-22 DIAGNOSIS — J069 Acute upper respiratory infection, unspecified: Secondary | ICD-10-CM | POA: Diagnosis not present

## 2023-02-22 LAB — RESP PANEL BY RT-PCR (RSV, FLU A&B, COVID)  RVPGX2
Influenza A by PCR: NEGATIVE
Influenza B by PCR: NEGATIVE
Resp Syncytial Virus by PCR: NEGATIVE
SARS Coronavirus 2 by RT PCR: NEGATIVE

## 2023-02-22 LAB — CBG MONITORING, ED: Glucose-Capillary: 291 mg/dL — ABNORMAL HIGH (ref 70–99)

## 2023-02-22 NOTE — ED Provider Notes (Signed)
[place] EMERGENCY DEPARTMENT AT [facility] Provider Note   CSN: [id number] Arrival date & time: 02/22/23  0148     History  Chief Complaint  Patient presents with   Hyperglycemia    [name] is a 32 y.o. female.  Presents to the emergency department stating that she has had a cough and thinks she might have COVID.  Blood sugar has been running high as well.       Home Medications Prior to Admission medications   Medication Sig Start Date End Date Taking? Authorizing Provider  amLODipine (NORVASC) 10 MG tablet Take 10 mg by mouth daily.    [provider]  benztropine (COGENTIN) 0.5 MG tablet Take 1 tablet (0.5 mg total) by mouth 2 (two) times daily. For eps 04/14/22   Nwoko, Nicole Kindred I, NP  busPIRone (BUSPAR) 10 MG tablet Take 1 tablet (10 mg total) by mouth 3 (three) times daily. Patient not taking: Reported on 01/09/2023 01/03/23   Phebe Colla A, NP  diclofenac Sodium (VOLTAREN) 1 % GEL Apply 4 g topically 4 (four) times daily. 01/21/23   Darrick Grinder, PA-C  gabapentin (NEURONTIN) 100 MG capsule Take 1 capsule (100 mg total) by mouth 3 (three) times daily. For agitation 04/14/22   Armandina Stammer I, NP  haloperidol (HALDOL) 10 MG tablet Take 1 tablet (10 mg total) by mouth at bedtime. 09/29/22   Ardis Hughs, NP  insulin aspart protamine- aspart (NOVOLOG MIX 70/30) (70-30) 100 UNIT/ML injection Inject 20 Units into the skin 2 (two) times daily with a meal.    [provider]  lithium carbonate (ESKALITH) 450 MG ER tablet Take 1 tablet (450 mg total) by mouth at bedtime. For mood stabilization Patient taking differently: Take 450 mg by mouth 2 (two) times daily. For mood stabilization 04/14/22   Armandina Stammer I, NP  metoprolol tartrate (LOPRESSOR) 25 MG tablet Take 25 mg by mouth 2 (two) times daily.    [provider]  metroNIDAZOLE (FLAGYL) 500 MG tablet Take 1 tablet (500 mg total) by mouth 2 (two) times daily. 01/17/23    Barrett, Horald Chestnut, PA-C  ondansetron (ZOFRAN-ODT) 4 MG disintegrating tablet 4mg  ODT q4 hours prn nausea/vomit Patient not taking: Reported on 01/09/2023 12/19/22   Melene Plan, DO  traZODone (DESYREL) 150 MG tablet Take 150 mg by mouth at bedtime.    [provider]  venlafaxine XR (EFFEXOR-XR) 150 MG 24 hr capsule Take 150 mg by mouth daily with breakfast.    [provider]      Allergies    Wellbutrin [bupropion], Omnipaque [iohexol], Penicillins, Atarax [hydroxyzine], Buspar [buspirone], Contrast media [iodinated contrast media], and Cymbalta [duloxetine hcl]    Review of Systems   Review of Systems  Physical Exam Updated Vital Signs BP 123/79 (BP Location: Right Arm)   Pulse 86   Temp 98 F (36.7 C) (Oral)   Resp 20   SpO2 99%  Physical Exam Vitals and nursing note reviewed.  Constitutional:      General: She is not in acute distress.    Appearance: She is well-developed.  HENT:     Head: Normocephalic and atraumatic.     Mouth/Throat:     Mouth: Mucous membranes are moist.  Eyes:     General: Vision grossly intact. Gaze aligned appropriately.     Extraocular Movements: Extraocular movements intact.     Conjunctiva/sclera: Conjunctivae normal.  Cardiovascular:     Rate and Rhythm: Normal rate and regular  rhythm.     Pulses: Normal pulses.     Heart sounds: Normal heart sounds, S1 normal and S2 normal. No murmur heard.    No friction rub. No gallop.  Pulmonary:     Effort: Pulmonary effort is normal. No respiratory distress.     Breath sounds: Normal breath sounds.  Abdominal:     General: Bowel sounds are normal.     Palpations: Abdomen is soft.     Tenderness: There is no abdominal tenderness. There is no guarding or rebound.     Hernia: No hernia is present.  Musculoskeletal:        General: No swelling.     Cervical back: Full passive range of motion without pain, normal range of motion and neck supple. No spinous process tenderness or muscular  tenderness. Normal range of motion.     Right lower leg: No edema.     Left lower leg: No edema.  Skin:    General: Skin is warm and dry.     Capillary Refill: Capillary refill takes less than 2 seconds.     Findings: No ecchymosis, erythema, rash or wound.  Neurological:     General: No focal deficit present.     Mental Status: She is alert and oriented to person, place, and time.     GCS: GCS eye subscore is 4. GCS verbal subscore is 5. GCS motor subscore is 6.     Cranial Nerves: Cranial nerves 2-12 are intact.     Sensory: Sensation is intact.     Motor: Motor function is intact.     Coordination: Coordination is intact.  Psychiatric:        Attention and Perception: Attention normal.        Mood and Affect: Mood normal.        Speech: Speech normal.        Behavior: Behavior normal.     ED Results / Procedures / Treatments   Labs (all labs ordered are listed, but only abnormal results are displayed) Labs Reviewed  CBG MONITORING, ED - Abnormal; Notable for the following components:      Result Value   Glucose-Capillary 291 (*)    All other components within normal limits  RESP PANEL BY RT-PCR (RSV, FLU A&B, COVID)  [id number]    EKG None  Radiology No results found.  Procedures Procedures    Medications Ordered in ED Medications - No data to display  ED Course/ Medical Decision Making/ A&P                                 Medical Decision Making  Presents to the emergency department stating that she has had a cough and is worried about COVID.  COVID is negative.  Patient breathing comfortably, lungs are clear.  Normal oxygen saturation.  No clinical concern for pneumonia.  Patient reports her sugars are running high.  She is in the 200s but this is not any significant change for her and certainly she is not in DKA or any endocrine crisis.        Final Clinical Impression(s) / ED Diagnoses Final diagnoses:  Upper respiratory tract infection, unspecified  type    Rx / DC Orders ED Discharge Orders     None         Adylynn Hertenstein, Canary Brim, MD 02/22/23 [phone]

## 2023-02-22 NOTE — ED Triage Notes (Signed)
Pt. BIB [facility] for hyperglycemia and possibly covid. Per EMS her sugar has been running high for the past 2 weeks. Cbg 307 with EMS. Pt. Is compliant with insulin. She has been experiencing vomiting, sore throat and a cough as well.

## 2023-02-25 ENCOUNTER — Encounter (HOSPITAL_COMMUNITY): Payer: Self-pay | Admitting: Emergency Medicine

## 2023-02-25 ENCOUNTER — Emergency Department (HOSPITAL_COMMUNITY)
Admission: EM | Admit: 2023-02-25 | Discharge: 2023-02-25 | Discharge status: Against Medical Advice | Payer: 59 | Attending: Emergency Medicine | Admitting: Emergency Medicine

## 2023-02-25 ENCOUNTER — Other Ambulatory Visit: Payer: Self-pay

## 2023-02-25 DIAGNOSIS — Z5321 Procedure and treatment not carried out due to patient leaving prior to being seen by health care provider: Secondary | ICD-10-CM | POA: Insufficient documentation

## 2023-02-25 DIAGNOSIS — R739 Hyperglycemia, unspecified: Secondary | ICD-10-CM | POA: Diagnosis not present

## 2023-02-25 DIAGNOSIS — L299 Pruritus, unspecified: Secondary | ICD-10-CM | POA: Diagnosis not present

## 2023-02-25 DIAGNOSIS — L292 Pruritus vulvae: Secondary | ICD-10-CM | POA: Diagnosis not present

## 2023-02-25 DIAGNOSIS — M25562 Pain in left knee: Secondary | ICD-10-CM | POA: Insufficient documentation

## 2023-02-25 DIAGNOSIS — R58 Hemorrhage, not elsewhere classified: Secondary | ICD-10-CM | POA: Diagnosis not present

## 2023-02-25 DIAGNOSIS — M25561 Pain in right knee: Secondary | ICD-10-CM | POA: Diagnosis not present

## 2023-02-25 NOTE — ED Notes (Signed)
[facility] states patient walked out the lobby

## 2023-02-25 NOTE — ED Triage Notes (Signed)
  Patient BIB EMS for vaginal itching and leg pain.  Patient states " I always have vaginal itching", and that her leg pain is a chronic problem.  Was given a topical cream to use last time she was seen and states it doesn't help.  Endorses bilateral knee pain, 7/10 aching.  Patient able to walk from EMS bay to triage without any difficulty.

## 2023-02-26 DIAGNOSIS — K219 Gastro-esophageal reflux disease without esophagitis: Secondary | ICD-10-CM | POA: Diagnosis not present

## 2023-02-26 DIAGNOSIS — E782 Mixed hyperlipidemia: Secondary | ICD-10-CM | POA: Diagnosis not present

## 2023-02-26 DIAGNOSIS — I10 Essential (primary) hypertension: Secondary | ICD-10-CM | POA: Diagnosis not present

## 2023-02-26 DIAGNOSIS — E1165 Type 2 diabetes mellitus with hyperglycemia: Secondary | ICD-10-CM | POA: Diagnosis not present

## 2023-02-26 DIAGNOSIS — E559 Vitamin D deficiency, unspecified: Secondary | ICD-10-CM | POA: Diagnosis not present

## 2023-02-26 DIAGNOSIS — E1142 Type 2 diabetes mellitus with diabetic polyneuropathy: Secondary | ICD-10-CM | POA: Diagnosis not present

## 2023-03-03 ENCOUNTER — Encounter (HOSPITAL_COMMUNITY): Payer: Self-pay

## 2023-03-03 ENCOUNTER — Emergency Department (HOSPITAL_COMMUNITY)
Admission: EM | Admit: 2023-03-03 | Discharge: 2023-03-03 | Disposition: A | Discharge status: Home / Self Care | Payer: 59 | Attending: Emergency Medicine | Admitting: Emergency Medicine

## 2023-03-03 ENCOUNTER — Other Ambulatory Visit: Payer: Self-pay

## 2023-03-03 DIAGNOSIS — Z794 Long term (current) use of insulin: Secondary | ICD-10-CM | POA: Diagnosis not present

## 2023-03-03 DIAGNOSIS — R739 Hyperglycemia, unspecified: Secondary | ICD-10-CM

## 2023-03-03 DIAGNOSIS — E1165 Type 2 diabetes mellitus with hyperglycemia: Secondary | ICD-10-CM | POA: Diagnosis not present

## 2023-03-03 LAB — COMPREHENSIVE METABOLIC PANEL
ALT: 25 U/L (ref 0–44)
AST: 26 U/L (ref 15–41)
Albumin: 4.1 g/dL (ref 3.5–5.0)
Alkaline Phosphatase: 77 U/L (ref 38–126)
Anion gap: 8 (ref 5–15)
BUN: 8 mg/dL (ref 6–20)
CO2: 25 mmol/L (ref 22–32)
Calcium: 9.2 mg/dL (ref 8.9–10.3)
Chloride: 104 mmol/L (ref 98–111)
Creatinine, Ser: 0.67 mg/dL (ref 0.44–1.00)
GFR, Estimated: >60 mL/min (ref >60)
Glucose, Bld: 369 mg/dL — ABNORMAL HIGH (ref 70–99)
Potassium: 4.2 mmol/L (ref 3.5–5.1)
Sodium: 137 mmol/L (ref 135–145)
Total Bilirubin: 0.6 mg/dL (ref <1.2)
Total Protein: 7.8 g/dL (ref 6.5–8.1)

## 2023-03-03 LAB — BLOOD GAS, VENOUS
Acid-Base Excess: 0 mmol/L (ref 0.0–2.0)
Bicarbonate: 25.4 mmol/L (ref 20.0–28.0)
O2 Saturation: 89.5 %
Patient temperature: 37
pCO2, Ven: 43 mm[Hg] — ABNORMAL LOW (ref 44–60)
pH, Ven: 7.38 (ref 7.25–7.43)
pO2, Ven: 55 mm[Hg] — ABNORMAL HIGH (ref 32–45)

## 2023-03-03 LAB — CBC
HCT: 36.2 % (ref 36.0–46.0)
Hemoglobin: 11.6 g/dL — ABNORMAL LOW (ref 12.0–15.0)
MCH: 25 pg — ABNORMAL LOW (ref 26.0–34.0)
MCHC: 32 g/dL (ref 30.0–36.0)
MCV: 78 fL — ABNORMAL LOW (ref 80.0–100.0)
Platelets: 280 10*3/uL (ref 150–400)
RBC: 4.64 MIL/uL (ref 3.87–5.11)
RDW: 15.3 % (ref 11.5–15.5)
WBC: 9.7 10*3/uL (ref 4.0–10.5)
nRBC: 0 % (ref 0.0–0.2)

## 2023-03-03 LAB — I-STAT CG4 LACTIC ACID, ED: Lactic Acid, Venous: 1.3 mmol/L (ref 0.5–1.9)

## 2023-03-03 LAB — HCG, SERUM, QUALITATIVE: Preg, Serum: NEGATIVE

## 2023-03-03 LAB — CBG MONITORING, ED: Glucose-Capillary: 321 mg/dL — ABNORMAL HIGH (ref 70–99)

## 2023-03-03 MED ORDER — INSULIN ASPART 100 UNIT/ML IJ SOLN
10.0000 [IU] | Freq: Once | INTRAMUSCULAR | Status: DC
  Filled 2023-03-03: qty 0.1

## 2023-03-03 NOTE — ED Notes (Signed)
2 attempts were made to obtain blood for labs without success.

## 2023-03-03 NOTE — ED Triage Notes (Signed)
C/o hyperglycemia with cbg @ 321 in triage.  Pt reports urinary frequency and frequent thirst x2 weeks.  Denies missing insulin doses Pt reports she is non compliant  with diet and eats cookies and fried foods.

## 2023-03-03 NOTE — ED Provider Notes (Signed)
[place] EMERGENCY DEPARTMENT AT [facility] Provider Note   CSN: [id number] Arrival date & time: 03/03/23  1148     History  Chief Complaint  Patient presents with   Hyperglycemia    [name] is a 32 y.o. female.  HPI Patient presents with return of hyperglycemia, occasional lightheadedness.  She notes that for the past few weeks she has had episodes of lightheadedness without syncope, without chest or abdominal pain.  She states that she has been taking her medication regularly, but has had persistent hyperglycemia.  She states that she does see outpatient clinic, for diabetes management, unclear when she had her last visit.     Home Medications Prior to Admission medications   Medication Sig Start Date End Date Taking? Authorizing Provider  amLODipine (NORVASC) 10 MG tablet Take 10 mg by mouth daily.    [provider]  benztropine (COGENTIN) 0.5 MG tablet Take 1 tablet (0.5 mg total) by mouth 2 (two) times daily. For eps 04/14/22   Nwoko, Nicole Kindred I, NP  busPIRone (BUSPAR) 10 MG tablet Take 1 tablet (10 mg total) by mouth 3 (three) times daily. Patient not taking: Reported on 01/09/2023 01/03/23   Phebe Colla A, NP  diclofenac Sodium (VOLTAREN) 1 % GEL Apply 4 g topically 4 (four) times daily. 01/21/23   Darrick Grinder, PA-C  gabapentin (NEURONTIN) 100 MG capsule Take 1 capsule (100 mg total) by mouth 3 (three) times daily. For agitation 04/14/22   Armandina Stammer I, NP  haloperidol (HALDOL) 10 MG tablet Take 1 tablet (10 mg total) by mouth at bedtime. 09/29/22   Ardis Hughs, NP  insulin aspart protamine- aspart (NOVOLOG MIX 70/30) (70-30) 100 UNIT/ML injection Inject 20 Units into the skin 2 (two) times daily with a meal.    [provider]  lithium carbonate (ESKALITH) 450 MG ER tablet Take 1 tablet (450 mg total) by mouth at bedtime. For mood stabilization Patient taking differently: Take 450 mg by mouth 2 (two) times daily. For mood  stabilization 04/14/22   Armandina Stammer I, NP  metoprolol tartrate (LOPRESSOR) 25 MG tablet Take 25 mg by mouth 2 (two) times daily.    [provider]  metroNIDAZOLE (FLAGYL) 500 MG tablet Take 1 tablet (500 mg total) by mouth 2 (two) times daily. 01/17/23   Barrett, Horald Chestnut, PA-C  ondansetron (ZOFRAN-ODT) 4 MG disintegrating tablet 4mg  ODT q4 hours prn nausea/vomit Patient not taking: Reported on 01/09/2023 12/19/22   Melene Plan, DO  traZODone (DESYREL) 150 MG tablet Take 150 mg by mouth at bedtime.    [provider]  venlafaxine XR (EFFEXOR-XR) 150 MG 24 hr capsule Take 150 mg by mouth daily with breakfast.    [provider]      Allergies    Wellbutrin [bupropion], Omnipaque [iohexol], Penicillins, Atarax [hydroxyzine], Buspar [buspirone], Contrast media [iodinated contrast media], and Cymbalta [duloxetine hcl]    Review of Systems   Review of Systems  Physical Exam Updated Vital Signs BP (!) 152/97   Pulse (!) 108   Temp 98.1 F (36.7 C)   Resp 20   Ht 5\' 7"  (1.702 m)   Wt 118.4 kg   SpO2 97%   BMI 40.88 kg/m  Physical Exam Vitals and nursing note reviewed.  Constitutional:      General: She is not in acute distress.    Appearance: She is well-developed. She is not ill-appearing, toxic-appearing or diaphoretic.     Comments: Patient awake, alert,  sitting on the edge of the bed smiling, interacting pleasantly with her female companion.  HENT:     Head: Normocephalic and atraumatic.  Eyes:     Conjunctiva/sclera: Conjunctivae normal.  Cardiovascular:     Rate and Rhythm: Normal rate and regular rhythm.  Pulmonary:     Effort: Pulmonary effort is normal. No respiratory distress.     Breath sounds: Normal breath sounds. No stridor.  Abdominal:     General: There is no distension.  Skin:    General: Skin is warm and dry.  Neurological:     Mental Status: She is alert and oriented to person, place, and time.     Cranial Nerves: No cranial nerve  deficit.  Psychiatric:        Mood and Affect: Mood normal.     ED Results / Procedures / Treatments   Labs (all labs ordered are listed, but only abnormal results are displayed) Labs Reviewed  BLOOD GAS, VENOUS - Abnormal; Notable for the following components:      Result Value   pCO2, Ven 43 (*)    pO2, Ven 55 (*)    All other components within normal limits  CBG MONITORING, ED - Abnormal; Notable for the following components:   Glucose-Capillary 321 (*)    All other components within normal limits  CBC  URINALYSIS, ROUTINE W REFLEX MICROSCOPIC  HCG, SERUM, QUALITATIVE  COMPREHENSIVE METABOLIC PANEL  CBG MONITORING, ED  I-STAT CG4 LACTIC ACID, ED    EKG None  Radiology No results found.  Procedures Procedures    Medications Ordered in ED Medications - No data to display  ED Course/ Medical Decision Making/ A&P                                 Medical Decision Making Adult female with hyperglycemia context of known diabetes, schizophrenia, other psychiatric disease as well as cognitive deficits.  Concern for hyperglycemia versus nonketotic hyperosmolar state versus DKA.  Patient had labs monitoring after my evaluation. Cardiac 95 sinus normal Pulse ox 98% room air normal   Amount and/or Complexity of Data Reviewed Independent Historian: friend External Data Reviewed: notes.    Details: 19 prior ED visits in the past 6 months Labs: ordered.  Risk Prescription drug management. Decision regarding hospitalization. Diagnosis or treatment significantly limited by social determinants of health.   On repeat exam patient in no distress.  She has mild hyperglycemia, 320, but no evidence for DKA, nonketotic hyperosmolar state, she remains hemodynamically unremarkable, awake, alert, speaking clearly.  Patient discharged to follow-up with primary care.        Final Clinical Impression(s) / ED Diagnoses Final diagnoses:  Hyperglycemia    Rx / DC Orders ED  Discharge Orders     None         Gerhard Munch, MD 03/03/23 1452

## 2023-03-03 NOTE — Discharge Instructions (Signed)
As discussed, your evaluation today has been largely reassuring.  But, it is important that you monitor your condition carefully, and do not hesitate to return to the ED if you develop new, or concerning changes in your condition. ? ?Otherwise, please follow-up with your physician for appropriate ongoing care. ? ?

## 2023-03-30 DIAGNOSIS — Z9151 Personal history of suicidal behavior: Secondary | ICD-10-CM | POA: Diagnosis not present

## 2023-03-30 DIAGNOSIS — Z9152 Personal history of nonsuicidal self-harm: Secondary | ICD-10-CM | POA: Diagnosis not present

## 2023-03-30 DIAGNOSIS — F331 Major depressive disorder, recurrent, moderate: Secondary | ICD-10-CM | POA: Diagnosis not present

## 2023-03-30 DIAGNOSIS — R45851 Suicidal ideations: Secondary | ICD-10-CM | POA: Diagnosis not present

## 2023-03-30 DIAGNOSIS — F068 Other specified mental disorders due to known physiological condition: Secondary | ICD-10-CM | POA: Diagnosis not present

## 2023-03-30 DIAGNOSIS — Z79899 Other long term (current) drug therapy: Secondary | ICD-10-CM | POA: Diagnosis not present

## 2023-03-31 DIAGNOSIS — R45851 Suicidal ideations: Secondary | ICD-10-CM | POA: Diagnosis not present

## 2023-03-31 DIAGNOSIS — F331 Major depressive disorder, recurrent, moderate: Secondary | ICD-10-CM | POA: Diagnosis not present

## 2023-03-31 DIAGNOSIS — F068 Other specified mental disorders due to known physiological condition: Secondary | ICD-10-CM | POA: Diagnosis not present

## 2023-04-12 ENCOUNTER — Ambulatory Visit (HOSPITAL_COMMUNITY)
Admission: EM | Admit: 2023-04-12 | Discharge: 2023-04-13 | Disposition: A | Discharge status: Home / Self Care | Payer: 59 | Attending: Psychiatry | Admitting: Psychiatry

## 2023-04-12 DIAGNOSIS — Z635 Disruption of family by separation and divorce: Secondary | ICD-10-CM | POA: Insufficient documentation

## 2023-04-12 DIAGNOSIS — Z653 Problems related to other legal circumstances: Secondary | ICD-10-CM | POA: Insufficient documentation

## 2023-04-12 DIAGNOSIS — F250 Schizoaffective disorder, bipolar type: Secondary | ICD-10-CM | POA: Insufficient documentation

## 2023-04-12 DIAGNOSIS — R4183 Borderline intellectual functioning: Secondary | ICD-10-CM | POA: Diagnosis not present

## 2023-04-12 DIAGNOSIS — R45851 Suicidal ideations: Secondary | ICD-10-CM | POA: Insufficient documentation

## 2023-04-12 DIAGNOSIS — F411 Generalized anxiety disorder: Secondary | ICD-10-CM | POA: Insufficient documentation

## 2023-04-12 DIAGNOSIS — F259 Schizoaffective disorder, unspecified: Secondary | ICD-10-CM | POA: Diagnosis not present

## 2023-04-12 LAB — POCT URINE DRUG SCREEN - MANUAL ENTRY (I-SCREEN)
POC Amphetamine UR: NOT DETECTED
POC Buprenorphine (BUP): NOT DETECTED
POC Cocaine UR: NOT DETECTED
POC Marijuana UR: POSITIVE — AB
POC Methadone UR: NOT DETECTED
POC Methamphetamine UR: NOT DETECTED
POC Morphine: NOT DETECTED
POC Oxazepam (BZO): NOT DETECTED
POC Oxycodone UR: NOT DETECTED
POC Secobarbital (BAR): NOT DETECTED

## 2023-04-12 LAB — COMPREHENSIVE METABOLIC PANEL
ALT: 24 U/L (ref 0–44)
AST: 25 U/L (ref 15–41)
Albumin: 4 g/dL (ref 3.5–5.0)
Alkaline Phosphatase: 62 U/L (ref 38–126)
Anion gap: 9 (ref 5–15)
BUN: <5 mg/dL — ABNORMAL LOW (ref 6–20)
CO2: 24 mmol/L (ref 22–32)
Calcium: 9.1 mg/dL (ref 8.9–10.3)
Chloride: 106 mmol/L (ref 98–111)
Creatinine, Ser: 0.76 mg/dL (ref 0.44–1.00)
GFR, Estimated: >60 mL/min (ref >60)
Glucose, Bld: 195 mg/dL — ABNORMAL HIGH (ref 70–99)
Potassium: 3.4 mmol/L — ABNORMAL LOW (ref 3.5–5.1)
Sodium: 139 mmol/L (ref 135–145)
Total Bilirubin: 0.5 mg/dL (ref <1.2)
Total Protein: 7.4 g/dL (ref 6.5–8.1)

## 2023-04-12 LAB — CBC WITH DIFFERENTIAL/PLATELET
Abs Immature Granulocytes: 0.03 10*3/uL (ref 0.00–0.07)
Basophils Absolute: 0.1 10*3/uL (ref 0.0–0.1)
Basophils Relative: 1 %
Eosinophils Absolute: 0.3 10*3/uL (ref 0.0–0.5)
Eosinophils Relative: 3 %
HCT: 38.7 % (ref 36.0–46.0)
Hemoglobin: 12.2 g/dL (ref 12.0–15.0)
Immature Granulocytes: 0 %
Lymphocytes Relative: 33 %
Lymphs Abs: 3.1 10*3/uL (ref 0.7–4.0)
MCH: 24.5 pg — ABNORMAL LOW (ref 26.0–34.0)
MCHC: 31.5 g/dL (ref 30.0–36.0)
MCV: 77.9 fL — ABNORMAL LOW (ref 80.0–100.0)
Monocytes Absolute: 0.5 10*3/uL (ref 0.1–1.0)
Monocytes Relative: 6 %
Neutro Abs: 5.5 10*3/uL (ref 1.7–7.7)
Neutrophils Relative %: 57 %
Platelets: 239 10*3/uL (ref 150–400)
RBC: 4.97 MIL/uL (ref 3.87–5.11)
RDW: 15.6 % — ABNORMAL HIGH (ref 11.5–15.5)
WBC: 9.6 10*3/uL (ref 4.0–10.5)
nRBC: 0 % (ref 0.0–0.2)

## 2023-04-12 LAB — POCT PREGNANCY, URINE: Preg Test, Ur: NEGATIVE

## 2023-04-12 LAB — HEMOGLOBIN A1C
Hgb A1c MFr Bld: 8.5 % — ABNORMAL HIGH (ref 4.8–5.6)
Mean Plasma Glucose: 197.25 mg/dL

## 2023-04-12 LAB — GLUCOSE, CAPILLARY
Glucose-Capillary: 232 mg/dL — ABNORMAL HIGH (ref 70–99)
Glucose-Capillary: 186 mg/dL — ABNORMAL HIGH (ref 70–99)

## 2023-04-12 LAB — LITHIUM LEVEL: Lithium Lvl: 0.1 mmol/L — ABNORMAL LOW (ref 0.60–1.20)

## 2023-04-12 LAB — TSH: TSH: 0.971 u[IU]/mL (ref 0.350–4.500)

## 2023-04-12 LAB — ETHANOL: Alcohol, Ethyl (B): <10 mg/dL (ref <10)

## 2023-04-12 LAB — POC URINE PREG, ED: Preg Test, Ur: NEGATIVE

## 2023-04-12 MED ORDER — GABAPENTIN 100 MG PO CAPS
100.0000 mg | ORAL_CAPSULE | Freq: Three times a day (TID) | ORAL | Status: DC
  Administered 2023-04-12 – 2023-04-13 (×3): 100 mg via ORAL
  Filled 2023-04-12 (×3): qty 1

## 2023-04-12 MED ORDER — ACETAMINOPHEN 325 MG PO TABS: 650.0000 mg | ORAL_TABLET | Freq: Four times a day (QID) | ORAL | Status: DC | PRN

## 2023-04-12 MED ORDER — AMLODIPINE BESYLATE 10 MG PO TABS
10.0000 mg | ORAL_TABLET | Freq: Every day | ORAL | Status: DC
  Administered 2023-04-12 – 2023-04-13 (×2): 10 mg via ORAL
  Filled 2023-04-12 (×2): qty 1

## 2023-04-12 MED ORDER — LITHIUM CARBONATE ER 450 MG PO TBCR
450.0000 mg | EXTENDED_RELEASE_TABLET | Freq: Every day | ORAL | Status: DC
  Administered 2023-04-12: 450 mg via ORAL
  Filled 2023-04-12: qty 1

## 2023-04-12 MED ORDER — TRAZODONE HCL 150 MG PO TABS
150.0000 mg | ORAL_TABLET | Freq: Every day | ORAL | Status: DC
  Administered 2023-04-12: 150 mg via ORAL
  Filled 2023-04-12: qty 1

## 2023-04-12 MED ORDER — INSULIN ASPART PROT & ASPART (70-30 MIX) 100 UNIT/ML ~~LOC~~ SUSP: 20.0000 [IU] | Freq: Two times a day (BID) | SUBCUTANEOUS | Status: DC

## 2023-04-12 MED ORDER — LORAZEPAM 1 MG PO TABS
1.0000 mg | ORAL_TABLET | ORAL | Status: AC | PRN
  Administered 2023-04-13: 1 mg via ORAL
  Filled 2023-04-12: qty 1

## 2023-04-12 MED ORDER — ZIPRASIDONE MESYLATE 20 MG IM SOLR
20.0000 mg | INTRAMUSCULAR | Status: AC | PRN
  Administered 2023-04-13: 20 mg via INTRAMUSCULAR
  Filled 2023-04-12: qty 20

## 2023-04-12 MED ORDER — INSULIN ASPART 100 UNIT/ML IJ SOLN
0.0000 [IU] | Freq: Three times a day (TID) | INTRAMUSCULAR | Status: DC
  Administered 2023-04-13: 11 [IU] via SUBCUTANEOUS
  Administered 2023-04-13: 7 [IU] via SUBCUTANEOUS

## 2023-04-12 MED ORDER — TRAZODONE HCL 150 MG PO TABS
150.0000 mg | ORAL_TABLET | Freq: Every evening | ORAL | Status: DC | PRN
Start: 2023-04-12 — End: 2023-04-12

## 2023-04-12 MED ORDER — OLANZAPINE 5 MG PO TBDP
5.0000 mg | ORAL_TABLET | Freq: Once | ORAL | Status: DC
  Filled 2023-04-12: qty 1

## 2023-04-12 MED ORDER — ALUM & MAG HYDROXIDE-SIMETH 200-200-20 MG/5ML PO SUSP: 30.0000 mL | ORAL | Status: DC | PRN

## 2023-04-12 MED ORDER — BENZTROPINE MESYLATE 0.5 MG PO TABS
0.5000 mg | ORAL_TABLET | Freq: Two times a day (BID) | ORAL | Status: DC
Start: 2023-04-12 — End: 2023-04-13
  Administered 2023-04-12 – 2023-04-13 (×2): 0.5 mg via ORAL
  Filled 2023-04-12 (×2): qty 1

## 2023-04-12 MED ORDER — INSULIN ASPART 100 UNIT/ML IJ SOLN: 0.0000 [IU] | Freq: Every day | INTRAMUSCULAR | Status: DC

## 2023-04-12 MED ORDER — DIPHENHYDRAMINE HCL 50 MG PO CAPS
50.0000 mg | ORAL_CAPSULE | Freq: Four times a day (QID) | ORAL | Status: DC | PRN
  Administered 2023-04-13: 50 mg via ORAL
  Filled 2023-04-12: qty 1

## 2023-04-12 MED ORDER — MAGNESIUM HYDROXIDE 400 MG/5ML PO SUSP: 15.0000 mL | Freq: Every day | ORAL | Status: DC | PRN

## 2023-04-12 MED ORDER — OLANZAPINE 5 MG PO TBDP
5.0000 mg | ORAL_TABLET | Freq: Three times a day (TID) | ORAL | Status: DC | PRN
  Administered 2023-04-13: 5 mg via ORAL
  Filled 2023-04-12: qty 1

## 2023-04-12 MED ORDER — OLANZAPINE 10 MG PO TBDP
10.0000 mg | ORAL_TABLET | Freq: Once | ORAL | Status: AC
  Administered 2023-04-12: 10 mg via ORAL

## 2023-04-12 NOTE — ED Notes (Signed)
Patient observed/assessed in bed/chair resting quietly appearing in no distress and verbalizing no complaints at this time. Will continue to monitor.  
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
## 2023-04-12 NOTE — ED Notes (Signed)
Patient alert and oriented to self and location. Patient sleeping in bed upon approach. Patient verbalizes no complaints at this time. Denies S/I, H/I, A/V/H, and Anxiety. Patient presents as slowed, eye contact is minimal, and speech is low. Will continue to monitor/support.
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
## 2023-04-12 NOTE — ED Provider Notes (Addendum)
[facility] Urgent Care Continuous Assessment Admission H&P  Date: 04/12/23 Patient Name: [name] MRN: [id number] Chief Complaint: Active suicidal ideation   Diagnoses:  Final diagnoses:  Suicidal ideation    HPI: Nil [name] is a 32 year old female with a past psychiatric history of borderline intellectual disability, schizoaffective disorder, bipolar type, repeated suicidal ideations, generalized anxiety disorder, panic attacks who presents with active suicidal ideation.  On interview, patient is extremely labile, occasionally yelling, at times inappropriate, but largely linear and cooperative with interview.  Patient says that she has been having serious suicidal thoughts for a number of reasons-including that her boyfriend has been unfaithful, and that her mother said that she "did not care" if patient overdosed.  Patient demonstrates occasional abnormal movements of right upper extremity and tongue movements.  Exhibits thought blocking and at one point appears on the verge of a non-epileptic seizure, but was redirectable with breathing exercises.  Initially says that she has been taking her medications, (lithium, gabapentin) then reveals that she last took her medications yesterday evening. Denies drug use.  Patient believes that she is currently a threat to herself, and requested inpatient treatment.  Her present ACTT team Producer, television/film/video) has been seeing her 3x/week but she is disappointed in their care and would like a new team. Amenable to staying at [facility] observation overnight, to see if she is in the morning.  Cooperative with staff direction, labs, and medications.  Underwent EKG without issue.  In the interest of safety for patient and staff, the remainder of the interview was shortened to be resumed in the a.m.  Total Time spent with patient: 15 minutes  Musculoskeletal  Strength & Muscle Tone: within normal limits Gait & Station: normal Patient leans: N/A  Psychiatric Specialty Exam   Presentation General Appearance:  Disheveled  Eye Contact: -- (intense)  Speech: Blocked  Speech Volume: Increased  Handedness: Right   Mood and Affect  Mood: Dysphoric; Angry; Irritable; Labile  Affect: Congruent; Inappropriate; Labile   Thought Process  Thought Processes: Disorganized  Descriptions of Associations:Circumstantial  Orientation:Full (Time, Place and Person)  Thought Content:Rumination; Perseveration; Scattered  Diagnosis of Schizophrenia or Schizoaffective disorder in past: Yes  Duration of Psychotic Symptoms: Greater than six months  Hallucinations:Hallucinations: -- (na)  Ideas of Reference:None  Suicidal Thoughts:Suicidal Thoughts: Yes, Active SI Active Intent and/or Plan: With Intent; With Plan; With Means to Carry Out  Homicidal Thoughts:Homicidal Thoughts: No   Sensorium  Memory: Immediate Fair; Recent Fair; Remote Fair  Judgment: Poor  Insight: Poor   Executive Functions  Concentration: Fair  Attention Span: Fair  Recall: [employer] of Knowledge: Fair  Language: Fair   Psychomotor Activity  Psychomotor Activity: Restlessness   Assets  Assets: Manufacturing systems engineer; Desire for Improvement; Social Support; Housing; Physical Health   Sleep  Sleep: Unknown  Physical Exam Constitutional:      General: She is in acute distress.     Appearance: She is obese.  HENT:     Head: Normocephalic.  Eyes:     Pupils: Pupils are equal, round, and reactive to light.  Pulmonary:     Effort: Pulmonary effort is normal. No respiratory distress.  Musculoskeletal:        General: Normal range of motion.  Neurological:     Mental Status: She is alert. Mental status is at baseline.    Review of Systems  All other systems reviewed and are negative.   Blood pressure (!) 145/77, pulse 82, temperature 97.8 F (36.6  C), temperature source Oral, resp. rate 19, SpO2 100%. There is no height or weight on file to  calculate BMI.  Past Psychiatric History: Multiple previous stays with similar presentation, has an ACTT team   Is the patient at risk to self? Yes  Has the patient been a risk to self in the past 6 months? Yes .    Has the patient been a risk to self within the distant past? Yes   Is the patient a risk to others? No   Has the patient been a risk to others in the past 6 months? No   Has the patient been a risk to others within the distant past? No   Past Medical History:  Diabetes, HTN, Morbid Obesity, GERD and Migraines   Family History: None pertinent.  Social History: Lives with self  Last Labs:  Admission on 03/03/2023, Discharged on 03/03/2023  Component Date Value Ref Range Status   Glucose-Capillary 03/03/2023 321 (H)  70 - 99 mg/dL Final   Glucose reference range applies only to samples taken after fasting for at least 8 hours.   WBC 03/03/2023 9.7  4.0 - 10.5 K/uL Final   RBC 03/03/2023 4.64  3.87 - 5.11 MIL/uL Final   Hemoglobin 03/03/2023 11.6 (L)  12.0 - 15.0 g/dL Final   HCT 65/78/4696 36.2  36.0 - 46.0 % Final   MCV 03/03/2023 78.0 (L)  80.0 - 100.0 fL Final   MCH 03/03/2023 25.0 (L)  26.0 - 34.0 pg Final   MCHC 03/03/2023 32.0  30.0 - 36.0 g/dL Final   RDW 29/52/8413 15.3  11.5 - 15.5 % Final   Platelets 03/03/2023 280  150 - 400 K/uL Final   nRBC 03/03/2023 0.0  0.0 - 0.2 % Final   Performed at [facility], [address] [place], [place] [address]   Preg, Serum 03/03/2023 NEGATIVE  NEGATIVE Final   Comment:        THE SENSITIVITY OF THIS METHODOLOGY IS >10 mIU/mL. Performed at [facility], [address], [place], [place] [address]    Sodium 03/03/2023 137  135 - 145 mmol/L Final   Potassium 03/03/2023 4.2  3.5 - 5.1 mmol/L Final   Chloride 03/03/2023 104  98 - 111 mmol/L Final   CO2 03/03/2023 25  22 - 32 mmol/L Final   Glucose, Bld 03/03/2023 369 (H)  70 - 99 mg/dL Final   Glucose reference range applies only to  samples taken after fasting for at least 8 hours.   BUN 03/03/2023 8  6 - 20 mg/dL Final   Creatinine, Ser 03/03/2023 0.67  0.44 - 1.00 mg/dL Final   Calcium 36/64/4034 9.2  8.9 - 10.3 mg/dL Final   Total Protein 74/25/9563 7.8  6.5 - 8.1 g/dL Final   Albumin 87/56/4332 4.1  3.5 - 5.0 g/dL Final   AST 95/18/8416 26  15 - 41 U/L Final   ALT 03/03/2023 25  0 - 44 U/L Final   Alkaline Phosphatase 03/03/2023 77  38 - 126 U/L Final   Total Bilirubin 03/03/2023 0.6  <1.2 mg/dL Final   GFR, Estimated 03/03/2023 >60  >60 mL/min Final   Comment: (NOTE) Calculated using the CKD-EPI Creatinine Equation (2021)    Anion gap 03/03/2023 8  5 - 15 Final   Performed at [facility], [address], [place], [place] [address]   pH, Ven 03/03/2023 7.38  7.25 - 7.43 Final   pCO2, Ven 03/03/2023 43 (L)  44 - 60 mmHg Final   pO2, Ven 03/03/2023 55 (H)  32 - 45 mmHg Final   Bicarbonate 03/03/2023 25.4  20.0 - 28.0 mmol/L Final   Acid-Base Excess 03/03/2023 0.0  0.0 - 2.0 mmol/L Final   O2 Saturation 03/03/2023 89.5  % Final   Patient temperature 03/03/2023 37.0   Final   Performed at [facility], [address], [place], [place] [address]   Lactic Acid, Venous 03/03/2023 1.3  0.5 - 1.9 mmol/L Final  Admission on 02/22/2023, Discharged on 02/22/2023  Component Date Value Ref Range Status   Glucose-Capillary 02/22/2023 291 (H)  70 - 99 mg/dL Final   Glucose reference range applies only to samples taken after fasting for at least 8 hours.   SARS Coronavirus 2 by RT PCR 02/22/2023 NEGATIVE  NEGATIVE Final   Comment: (NOTE) SARS-CoV-2 target nucleic acids are NOT DETECTED.  The SARS-CoV-2 RNA is generally detectable in upper respiratory specimens during the acute phase of infection. The lowest concentration of SARS-CoV-2 viral copies this assay can detect is 138 copies/mL. A negative result does not preclude SARS-Cov-2 infection and should not be used as the sole basis  for treatment or other patient management decisions. A negative result may occur with  improper specimen collection/handling, submission of specimen other than nasopharyngeal swab, presence of viral mutation(s) within the areas targeted by this assay, and inadequate number of viral copies(<138 copies/mL). A negative result must be combined with clinical observations, patient history, and epidemiological information. The expected result is Negative.  Fact Sheet for Patients:  BloggerCourse.com  Fact Sheet for Healthcare Providers:  SeriousBroker.it  This test is no                          t yet approved or cleared by the [place] FDA and  has been authorized for detection and/or diagnosis of SARS-CoV-2 by FDA under an Emergency Use Authorization (EUA). This EUA will remain  in effect (meaning this test can be used) for the duration of the COVID-19 declaration under Section 564(b)(1) of the Act, 21 U.S.C.section 360bbb-3(b)(1), unless the authorization is terminated  or revoked sooner.       Influenza A by PCR 02/22/2023 NEGATIVE  NEGATIVE Final   Influenza B by PCR 02/22/2023 NEGATIVE  NEGATIVE Final   Comment: (NOTE) The Xpert Xpress SARS-CoV-2/FLU/RSV plus assay is intended as an aid in the diagnosis of influenza from Nasopharyngeal swab specimens and should not be used as a sole basis for treatment. Nasal washings and aspirates are unacceptable for Xpert Xpress SARS-CoV-2/FLU/RSV testing.  Fact Sheet for Patients: BloggerCourse.com  Fact Sheet for Healthcare Providers: SeriousBroker.it  This test is not yet approved or cleared by the [place] FDA and has been authorized for detection and/or diagnosis of SARS-CoV-2 by FDA under an Emergency Use Authorization (EUA). This EUA will remain in effect (meaning this test can be used) for the duration of the COVID-19  declaration under Section 564(b)(1) of the Act, 21 U.S.C. section 360bbb-3(b)(1), unless the authorization is terminated or revoked.     Resp Syncytial Virus by PCR 02/22/2023 NEGATIVE  NEGATIVE Final   Comment: (NOTE) Fact Sheet for Patients: BloggerCourse.com  Fact Sheet for Healthcare Providers: SeriousBroker.it  This test is not yet approved or cleared by the [place] FDA and has been authorized for detection and/or diagnosis of SARS-CoV-2 by FDA under an Emergency Use Authorization (EUA). This EUA will remain in effect (meaning this  test can be used) for the duration of the COVID-19 declaration under Section 564(b)(1) of the Act, 21 U.S.C. section 360bbb-3(b)(1), unless the authorization is terminated or revoked.  Performed at [facility], [address], [place], [place] [address]   Admission on 02/07/2023, Discharged on 02/07/2023  Component Date Value Ref Range Status   Color, Urine 02/07/2023 STRAW (A)  YELLOW Final   APPearance 02/07/2023 CLEAR  CLEAR Final   Specific Gravity, Urine 02/07/2023 1.028  1.005 - 1.030 Final   pH 02/07/2023 5.0  5.0 - 8.0 Final   Glucose, UA 02/07/2023 >=500 (A)  NEGATIVE mg/dL Final   Hgb urine dipstick 02/07/2023 NEGATIVE  NEGATIVE Final   Bilirubin Urine 02/07/2023 NEGATIVE  NEGATIVE Final   Ketones, ur 02/07/2023 NEGATIVE  NEGATIVE mg/dL Final   Protein, ur 91/47/8295 NEGATIVE  NEGATIVE mg/dL Final   Nitrite 62/13/0865 NEGATIVE  NEGATIVE Final   Leukocytes,Ua 02/07/2023 NEGATIVE  NEGATIVE Final   RBC / HPF 02/07/2023 0-5  0 - 5 RBC/hpf Final   WBC, UA 02/07/2023 0-5  0 - 5 WBC/hpf Final   Bacteria, UA 02/07/2023 NONE SEEN  NONE SEEN Final   Squamous Epithelial / HPF 02/07/2023 0-5  0 - 5 /HPF Final   Hyaline Casts, UA 02/07/2023 PRESENT   Final   Performed at [facility], [address], [place], [place] [address]   Preg Test, Ur  02/07/2023 NEGATIVE  NEGATIVE Final   Comment:        THE SENSITIVITY OF THIS METHODOLOGY IS >25 mIU/mL. Performed at [facility], [address] [place], [place] [address]    Yeast Wet Prep HPF POC 02/07/2023 NONE SEEN  NONE SEEN Final   Trich, Wet Prep 02/07/2023 NONE SEEN  NONE SEEN Final   Clue Cells Wet Prep HPF POC 02/07/2023 NONE SEEN  NONE SEEN Final   WBC, Wet Prep HPF POC 02/07/2023 <10  <10 Final   Sperm 02/07/2023 NONE SEEN   Final   Performed at [facility], [address], [place], [place] [address]   Neisseria Gonorrhea 02/07/2023 Negative   Final   Chlamydia 02/07/2023 Negative   Final   Comment 02/07/2023 Normal Reference Ranger Chlamydia - Negative   Final   Comment 02/07/2023 Normal Reference Range Neisseria Gonorrhea - Negative   Final  Admission on 01/17/2023, Discharged on 01/17/2023  Component Date Value Ref Range Status   Glucose-Capillary 01/17/2023 292 (H)  70 - 99 mg/dL Final   Glucose reference range applies only to samples taken after fasting for at least 8 hours.   Color, Urine 01/17/2023 STRAW (A)  YELLOW Final   APPearance 01/17/2023 HAZY (A)  CLEAR Final   Specific Gravity, Urine 01/17/2023 1.022  1.005 - 1.030 Final   pH 01/17/2023 5.0  5.0 - 8.0 Final   Glucose, UA 01/17/2023 >=500 (A)  NEGATIVE mg/dL Final   Hgb urine dipstick 01/17/2023 SMALL (A)  NEGATIVE Final   Bilirubin Urine 01/17/2023 NEGATIVE  NEGATIVE Final   Ketones, ur 01/17/2023 5 (A)  NEGATIVE mg/dL Final   Protein, ur 44/04/270 NEGATIVE  NEGATIVE mg/dL Final   Nitrite 53/66/4403 NEGATIVE  NEGATIVE Final   Leukocytes,Ua 01/17/2023 MODERATE (A)  NEGATIVE Final   RBC / HPF 01/17/2023 0-5  0 - 5 RBC/hpf Final   WBC, UA 01/17/2023 6-10  0 - 5 WBC/hpf Final   Bacteria, UA 01/17/2023 RARE (A)  NONE SEEN Final   Squamous Epithelial / HPF 01/17/2023 0-5  0 - 5 /HPF Final   Mucus 01/17/2023 PRESENT   Final   Performed at [facility], [address], [place], [place] [address]   Yeast Wet Prep HPF POC 01/17/2023 NONE SEEN  NONE SEEN Final   Trich, Wet Prep 01/17/2023 NONE SEEN  NONE SEEN Final   Clue Cells Wet Prep HPF POC 01/17/2023 PRESENT (A)  NONE SEEN Final   WBC, Wet Prep HPF POC 01/17/2023 <10  <10 Final   Sperm 01/17/2023 NONE SEEN   Final   Performed at [facility], [address], [place], [place] [address]   Neisseria Gonorrhea 01/17/2023 Negative   Final   Chlamydia 01/17/2023 Negative   Final   Comment 01/17/2023 Normal Reference Ranger Chlamydia - Negative   Final   Comment 01/17/2023 Normal Reference Range Neisseria Gonorrhea - Negative   Final  Admission on 01/16/2023, Discharged on 01/16/2023  Component Date Value Ref Range Status   Color, Urine 01/16/2023 STRAW (A)  YELLOW Final   APPearance 01/16/2023 CLEAR  CLEAR Final   Specific Gravity, Urine 01/16/2023 1.015  1.005 - 1.030 Final   pH 01/16/2023 5.0  5.0 - 8.0 Final   Glucose, UA 01/16/2023 >=500 (A)  NEGATIVE mg/dL Final   Hgb urine dipstick 01/16/2023 NEGATIVE  NEGATIVE Final   Bilirubin Urine 01/16/2023 NEGATIVE  NEGATIVE Final   Ketones, ur 01/16/2023 NEGATIVE  NEGATIVE mg/dL Final   Protein, ur 57/84/6962 NEGATIVE  NEGATIVE mg/dL Final   Nitrite 95/28/4132 NEGATIVE  NEGATIVE Final   Leukocytes,Ua 01/16/2023 NEGATIVE  NEGATIVE Final   RBC / HPF 01/16/2023 0-5  0 - 5 RBC/hpf Final   WBC, UA 01/16/2023 0-5  0 - 5 WBC/hpf Final   Bacteria, UA 01/16/2023 NONE SEEN  NONE SEEN Final   Squamous Epithelial / HPF 01/16/2023 0-5  0 - 5 /HPF Final   Mucus 01/16/2023 PRESENT   Final   Performed at [facility], [address] [place], [place] [address]   Preg Test, Ur 01/16/2023 NEGATIVE  NEGATIVE Final   Comment:        THE SENSITIVITY OF THIS METHODOLOGY IS >25 mIU/mL. Performed at [facility], [address] [place], [place] [address]   Admission on 01/08/2023, Discharged  on 01/09/2023  Component Date Value Ref Range Status   Sodium 01/08/2023 137  135 - 145 mmol/L Final   Potassium 01/08/2023 3.2 (L)  3.5 - 5.1 mmol/L Final   Chloride 01/08/2023 103  98 - 111 mmol/L Final   CO2 01/08/2023 25  22 - 32 mmol/L Final   Glucose, Bld 01/08/2023 178 (H)  70 - 99 mg/dL Final   Glucose reference range applies only to samples taken after fasting for at least 8 hours.   BUN 01/08/2023 8  6 - 20 mg/dL Final   Creatinine, Ser 01/08/2023 0.71  0.44 - 1.00 mg/dL Final   Calcium 66/44/0347 9.3  8.9 - 10.3 mg/dL Final   Total Protein 42/59/5638 8.0  6.5 - 8.1 g/dL Final   Albumin 75/64/3329 4.4  3.5 - 5.0 g/dL Final   AST 51/88/4166 28  15 - 41 U/L Final   ALT 01/08/2023 28  0 - 44 U/L Final   Alkaline Phosphatase 01/08/2023 64  38 - 126 U/L Final   Total Bilirubin 01/08/2023 0.8  0.3 - 1.2 mg/dL Final   GFR, Estimated 01/08/2023 >60  >60 mL/min Final   Comment: (NOTE) Calculated using the CKD-EPI Creatinine Equation (2021)  Anion gap 01/08/2023 9  5 - 15 Final   Performed at [facility], [address] [place], [place] [address]   Alcohol, Ethyl (B) 01/08/2023 <10  <10 mg/dL Final   Comment: (NOTE) Lowest detectable limit for serum alcohol is 10 mg/dL.  For medical purposes only. Performed at [facility], [address] [place], [place] [address]    Opiates 01/08/2023 NONE DETECTED  NONE DETECTED Final   Cocaine 01/08/2023 NONE DETECTED  NONE DETECTED Final   Benzodiazepines 01/08/2023 NONE DETECTED  NONE DETECTED Final   Amphetamines 01/08/2023 NONE DETECTED  NONE DETECTED Final   Tetrahydrocannabinol 01/08/2023 NONE DETECTED  NONE DETECTED Final   Barbiturates 01/08/2023 NONE DETECTED  NONE DETECTED Final   Comment: (NOTE) DRUG SCREEN FOR MEDICAL PURPOSES ONLY.  IF CONFIRMATION IS NEEDED FOR ANY PURPOSE, NOTIFY LAB WITHIN 5 DAYS.  LOWEST DETECTABLE LIMITS FOR URINE DRUG SCREEN Drug Class                      Cutoff (ng/mL) Amphetamine and metabolites    1000 Barbiturate and metabolites    200 Benzodiazepine                 200 Opiates and metabolites        300 Cocaine and metabolites        300 THC                            50 Performed at [facility], [address] [place], [place] [address]    WBC 01/08/2023 13.1 (H)  4.0 - 10.5 K/uL Final   RBC 01/08/2023 4.67  3.87 - 5.11 MIL/uL Final   Hemoglobin 01/08/2023 11.2 (L)  12.0 - 15.0 g/dL Final   HCT 91/47/8295 36.9  36.0 - 46.0 % Final   MCV 01/08/2023 79.0 (L)  80.0 - 100.0 fL Final   MCH 01/08/2023 24.0 (L)  26.0 - 34.0 pg Final   MCHC 01/08/2023 30.4  30.0 - 36.0 g/dL Final   RDW 62/13/0865 15.7 (H)  11.5 - 15.5 % Final   Platelets 01/08/2023 338  150 - 400 K/uL Final   Comment: SPECIMEN CHECKED FOR CLOTS REPEATED TO VERIFY    nRBC 01/08/2023 0.0  0.0 - 0.2 % Final   Neutrophils Relative % 01/08/2023 56  % Final   Neutro Abs 01/08/2023 7.3  1.7 - 7.7 K/uL Final   Lymphocytes Relative 01/08/2023 36  % Final   Lymphs Abs 01/08/2023 4.8 (H)  0.7 - 4.0 K/uL Final   Monocytes Relative 01/08/2023 5  % Final   Monocytes Absolute 01/08/2023 0.6  0.1 - 1.0 K/uL Final   Eosinophils Relative 01/08/2023 2  % Final   Eosinophils Absolute 01/08/2023 0.3  0.0 - 0.5 K/uL Final   Basophils Relative 01/08/2023 1  % Final   Basophils Absolute 01/08/2023 0.1  0.0 - 0.1 K/uL Final   Immature Granulocytes 01/08/2023 0  % Final   Abs Immature Granulocytes 01/08/2023 0.05  0.00 - 0.07 K/uL Final   Performed at [facility], [address], [place], [place] [address]   Preg, Serum 01/08/2023 NEGATIVE  NEGATIVE Final   Comment:        THE SENSITIVITY OF THIS METHODOLOGY IS >10 mIU/mL. Performed at [facility], [address], [place], [place] [address]    Acetaminophen (Tylenol), Serum 01/08/2023 55 (H)  10 - 30 ug/mL Final   Comment: (NOTE) Therapeutic concentrations vary  significantly. A range of 10-30 ug/mL  may be an effective concentration for many patients. However, some  are best treated at concentrations outside of this range. Acetaminophen concentrations >150 ug/mL at 4 hours after ingestion  and >50 ug/mL at 12 hours after ingestion are often associated with  toxic reactions.  Performed at [facility] [address], [place], [place] [address]    Salicylate Lvl 01/08/2023 <7.0 (L)  7.0 - 30.0 mg/dL Final   Performed at [facility], [address] [place], [place] [address]   Acetaminophen (Tylenol), Serum 01/09/2023 18  10 - 30 ug/mL Final   Comment: (NOTE) Therapeutic concentrations vary significantly. A range of 10-30 ug/mL  may be an effective concentration for many patients. However, some  are best treated at concentrations outside of this range. Acetaminophen concentrations >150 ug/mL at 4 hours after ingestion  and >50 ug/mL at 12 hours after ingestion are often associated with  toxic reactions.  Performed at [facility], [address] [place], [place] [address]   Admission on 01/06/2023, Discharged on 01/06/2023  Component Date Value Ref Range Status   POC Amphetamine UR 01/06/2023 None Detected  NONE DETECTED (Cut Off Level 1000 ng/mL) Final   POC Secobarbital (BAR) 01/06/2023 None Detected  NONE DETECTED (Cut Off Level 300 ng/mL) Final   POC Buprenorphine (BUP) 01/06/2023 None Detected  NONE DETECTED (Cut Off Level 10 ng/mL) Final   POC Oxazepam (BZO) 01/06/2023 None Detected  NONE DETECTED (Cut Off Level 300 ng/mL) Final   POC Cocaine UR 01/06/2023 None Detected  NONE DETECTED (Cut Off Level 300 ng/mL) Final   POC Methamphetamine UR 01/06/2023 None Detected  NONE DETECTED (Cut Off Level 1000 ng/mL) Final   POC Morphine 01/06/2023 None Detected  NONE DETECTED (Cut Off Level 300 ng/mL) Final   POC Methadone UR 01/06/2023 None Detected  NONE DETECTED (Cut Off Level 300  ng/mL) Final   POC Oxycodone UR 01/06/2023 None Detected  NONE DETECTED (Cut Off Level 100 ng/mL) Final   POC Marijuana UR 01/06/2023 None Detected  NONE DETECTED (Cut Off Level 50 ng/mL) Final   Preg Test, Ur 01/06/2023 Negative  Negative Final   Glucose-Capillary 01/06/2023 179 (H)  70 - 99 mg/dL Final   Glucose reference range applies only to samples taken after fasting for at least 8 hours.  Admission on 01/02/2023, Discharged on 01/03/2023  Component Date Value Ref Range Status   WBC 01/03/2023 10.4  4.0 - 10.5 K/uL Final   RBC 01/03/2023 4.72  3.87 - 5.11 MIL/uL Final   Hemoglobin 01/03/2023 11.3 (L)  12.0 - 15.0 g/dL Final   HCT 28/41/3244 36.4  36.0 - 46.0 % Final   MCV 01/03/2023 77.1 (L)  80.0 - 100.0 fL Final   MCH 01/03/2023 23.9 (L)  26.0 - 34.0 pg Final   MCHC 01/03/2023 31.0  30.0 - 36.0 g/dL Final   RDW 04/29/7251 15.4  11.5 - 15.5 % Final   Platelets 01/03/2023 304  150 - 400 K/uL Final   nRBC 01/03/2023 0.0  0.0 - 0.2 % Final   Neutrophils Relative % 01/03/2023 58  % Final   Neutro Abs 01/03/2023 6.0  1.7 - 7.7 K/uL Final   Lymphocytes Relative 01/03/2023 33  % Final   Lymphs Abs 01/03/2023 3.5  0.7 - 4.0 K/uL Final   Monocytes Relative 01/03/2023 5  % Final   Monocytes  Absolute 01/03/2023 0.5  0.1 - 1.0 K/uL Final   Eosinophils Relative 01/03/2023 3  % Final   Eosinophils Absolute 01/03/2023 0.4  0.0 - 0.5 K/uL Final   Basophils Relative 01/03/2023 1  % Final   Basophils Absolute 01/03/2023 0.1  0.0 - 0.1 K/uL Final   Immature Granulocytes 01/03/2023 0  % Final   Abs Immature Granulocytes 01/03/2023 0.04  0.00 - 0.07 K/uL Final   Performed at [facility], [address], [place], [place] [address]   Sodium 01/03/2023 139  135 - 145 mmol/L Final   Potassium 01/03/2023 3.5  3.5 - 5.1 mmol/L Final   Chloride 01/03/2023 105  98 - 111 mmol/L Final   CO2 01/03/2023 27  22 - 32 mmol/L Final   Glucose, Bld 01/03/2023 205 (H)  70 - 99 mg/dL Final   Glucose  reference range applies only to samples taken after fasting for at least 8 hours.   BUN 01/03/2023 <5 (L)  6 - 20 mg/dL Final   Creatinine, Ser 01/03/2023 0.83  0.44 - 1.00 mg/dL Final   Calcium 60/45/4098 8.8 (L)  8.9 - 10.3 mg/dL Final   Total Protein 11/91/4782 7.3  6.5 - 8.1 g/dL Final   Albumin 95/62/1308 3.8  3.5 - 5.0 g/dL Final   AST 65/78/4696 27  15 - 41 U/L Final   ALT 01/03/2023 33  0 - 44 U/L Final   Alkaline Phosphatase 01/03/2023 67  38 - 126 U/L Final   Total Bilirubin 01/03/2023 0.6  0.3 - 1.2 mg/dL Final   GFR, Estimated 01/03/2023 >60  >60 mL/min Final   Comment: (NOTE) Calculated using the CKD-EPI Creatinine Equation (2021)    Anion gap 01/03/2023 7  5 - 15 Final   Performed at [facility], [address] [place], [place] [address]   Lithium Lvl 01/03/2023 0.25 (L)  0.60 - 1.20 mmol/L Final   Performed at [facility], [address] [address] [place], [place] [address]   Glucose-Capillary 01/03/2023 193 (H)  70 - 99 mg/dL Final   Glucose reference range applies only to samples taken after fasting for at least 8 hours.   Glucose-Capillary 01/02/2023 203 (H)  70 - 99 mg/dL Final   Glucose reference range applies only to samples taken after fasting for at least 8 hours.  Admission on 12/05/2022, Discharged on 12/06/2022  Component Date Value Ref Range Status   Glucose-Capillary 12/06/2022 196 (H)  70 - 99 mg/dL Final   Glucose reference range applies only to samples taken after fasting for at least 8 hours.   Sodium 12/05/2022 135  135 - 145 mmol/L Final   Potassium 12/05/2022 3.2 (L)  3.5 - 5.1 mmol/L Final   Chloride 12/05/2022 102  98 - 111 mmol/L Final   CO2 12/05/2022 24  22 - 32 mmol/L Final   Glucose, Bld 12/05/2022 189 (H)  70 - 99 mg/dL Final   Glucose reference range applies only to samples taken after fasting for at least 8 hours.   BUN 12/05/2022 13  6 - 20 mg/dL Final   Creatinine, Ser 12/05/2022 0.87  0.44 - 1.00 mg/dL Final   Calcium 40/01/2724  9.3  8.9 - 10.3 mg/dL Final   Total Protein 36/64/4034 7.9  6.5 - 8.1 g/dL Final   Albumin 74/25/9563 4.4  3.5 - 5.0 g/dL Final   AST 87/56/4332 33  15 - 41 U/L Final   ALT 12/05/2022 32  0 - 44 U/L Final  Alkaline Phosphatase 12/05/2022 67  38 - 126 U/L Final   Total Bilirubin 12/05/2022 0.6  0.3 - 1.2 mg/dL Final   GFR, Estimated 12/05/2022 >60  >60 mL/min Final   Comment: (NOTE) Calculated using the CKD-EPI Creatinine Equation (2021)    Anion gap 12/05/2022 9  5 - 15 Final   Performed at [facility], [address] [place], [place] [address]   Opiates 12/06/2022 NONE DETECTED  NONE DETECTED Final   Cocaine 12/06/2022 NONE DETECTED  NONE DETECTED Final   Benzodiazepines 12/06/2022 NONE DETECTED  NONE DETECTED Final   Amphetamines 12/06/2022 NONE DETECTED  NONE DETECTED Final   Tetrahydrocannabinol 12/06/2022 NONE DETECTED  NONE DETECTED Final   Barbiturates 12/06/2022 NONE DETECTED  NONE DETECTED Final   Comment: (NOTE) DRUG SCREEN FOR MEDICAL PURPOSES ONLY.  IF CONFIRMATION IS NEEDED FOR ANY PURPOSE, NOTIFY LAB WITHIN 5 DAYS.  LOWEST DETECTABLE LIMITS FOR URINE DRUG SCREEN Drug Class                     Cutoff (ng/mL) Amphetamine and metabolites    1000 Barbiturate and metabolites    200 Benzodiazepine                 200 Opiates and metabolites        300 Cocaine and metabolites        300 THC                            50 Performed at [facility], [address], [place], [place] [address]    WBC 12/05/2022 15.6 (H)  4.0 - 10.5 K/uL Final   RBC 12/05/2022 4.86  3.87 - 5.11 MIL/uL Final   Hemoglobin 12/05/2022 11.9 (L)  12.0 - 15.0 g/dL Final   HCT 36/64/4034 38.0  36.0 - 46.0 % Final   MCV 12/05/2022 78.2 (L)  80.0 - 100.0 fL Final   MCH 12/05/2022 24.5 (L)  26.0 - 34.0 pg Final   MCHC 12/05/2022 31.3  30.0 - 36.0 g/dL Final   RDW 74/25/9563 16.0 (H)  11.5 - 15.5 % Final   Platelets 12/05/2022 286  150 - 400 K/uL Final    nRBC 12/05/2022 0.0  0.0 - 0.2 % Final   Neutrophils Relative % 12/05/2022 56  % Final   Neutro Abs 12/05/2022 8.8 (H)  1.7 - 7.7 K/uL Final   Lymphocytes Relative 12/05/2022 34  % Final   Lymphs Abs 12/05/2022 5.3 (H)  0.7 - 4.0 K/uL Final   Monocytes Relative 12/05/2022 6  % Final   Monocytes Absolute 12/05/2022 0.9  0.1 - 1.0 K/uL Final   Eosinophils Relative 12/05/2022 3  % Final   Eosinophils Absolute 12/05/2022 0.5  0.0 - 0.5 K/uL Final   Basophils Relative 12/05/2022 1  % Final   Basophils Absolute 12/05/2022 0.1  0.0 - 0.1 K/uL Final   Immature Granulocytes 12/05/2022 0  % Final   Abs Immature Granulocytes 12/05/2022 0.04  0.00 - 0.07 K/uL Final   Performed at [facility], [address], [place], [place] [address]   Preg, Serum 12/05/2022 NEGATIVE  NEGATIVE Final   Comment:        THE SENSITIVITY OF THIS METHODOLOGY IS >10 mIU/mL. Performed at [facility], [address], [place], [place] [address]    Acetaminophen (Tylenol), Serum 12/06/2022 <10 (L)  10 - 30 ug/mL Final  Comment: (NOTE) Therapeutic concentrations vary significantly. A range of 10-30 ug/mL  may be an effective concentration for many patients. However, some  are best treated at concentrations outside of this range. Acetaminophen concentrations >150 ug/mL at 4 hours after ingestion  and >50 ug/mL at 12 hours after ingestion are often associated with  toxic reactions.  Performed at [facility], [address] [place], [place] [address]    Acetaminophen (Tylenol), Serum 12/06/2022 <10 (L)  10 - 30 ug/mL Final   Comment: (NOTE) Therapeutic concentrations vary significantly. A range of 10-30 ug/mL  may be an effective concentration for many patients. However, some  are best treated at concentrations outside of this range. Acetaminophen concentrations >150 ug/mL at 4 hours after ingestion  and >50 ug/mL at 12 hours after ingestion are often  associated with  toxic reactions.  Performed at [facility], [address], [place], [place] [address]   Admission on 11/01/2022, Discharged on 11/02/2022  Component Date Value Ref Range Status   Color, Urine 11/02/2022 STRAW (A)  YELLOW Final   APPearance 11/02/2022 CLEAR  CLEAR Final   Specific Gravity, Urine 11/02/2022 1.014  1.005 - 1.030 Final   pH 11/02/2022 5.0  5.0 - 8.0 Final   Glucose, UA 11/02/2022 >=500 (A)  NEGATIVE mg/dL Final   Hgb urine dipstick 11/02/2022 SMALL (A)  NEGATIVE Final   Bilirubin Urine 11/02/2022 NEGATIVE  NEGATIVE Final   Ketones, ur 11/02/2022 NEGATIVE  NEGATIVE mg/dL Final   Protein, ur 09/81/1914 NEGATIVE  NEGATIVE mg/dL Final   Nitrite 78/29/5621 NEGATIVE  NEGATIVE Final   Leukocytes,Ua 11/02/2022 NEGATIVE  NEGATIVE Final   RBC / HPF 11/02/2022 11-20  0 - 5 RBC/hpf Final   WBC, UA 11/02/2022 0-5  0 - 5 WBC/hpf Final   Bacteria, UA 11/02/2022 NONE SEEN  NONE SEEN Final   Squamous Epithelial / HPF 11/02/2022 0-5  0 - 5 /HPF Final   Mucus 11/02/2022 PRESENT   Final   Performed at [facility], [address], [place], [place] [address]   Preg Test, Ur 11/02/2022 NEGATIVE  NEGATIVE Final   Comment:        THE SENSITIVITY OF THIS METHODOLOGY IS >25 mIU/mL. Performed at [facility], [address] [place], [place] [address]    WBC 11/01/2022 10.7 (H)  4.0 - 10.5 K/uL Final   RBC 11/01/2022 4.07  3.87 - 5.11 MIL/uL Final   Hemoglobin 11/01/2022 9.7 (L)  12.0 - 15.0 g/dL Final   HCT 62/95/2841 31.8 (L)  36.0 - 46.0 % Final   MCV 11/01/2022 78.1 (L)  80.0 - 100.0 fL Final   MCH 11/01/2022 23.8 (L)  26.0 - 34.0 pg Final   MCHC 11/01/2022 30.5  30.0 - 36.0 g/dL Final   RDW 32/44/0102 16.3 (H)  11.5 - 15.5 % Final   Platelets 11/01/2022 219  150 - 400 K/uL Final   nRBC 11/01/2022 0.0  0.0 - 0.2 % Final   Neutrophils Relative % 11/01/2022 51  % Final   Neutro Abs 11/01/2022 5.5  1.7 - 7.7 K/uL  Final   Lymphocytes Relative 11/01/2022 37  % Final   Lymphs Abs 11/01/2022 3.9  0.7 - 4.0 K/uL Final   Monocytes Relative 11/01/2022 6  % Final   Monocytes Absolute 11/01/2022 0.6  0.1 - 1.0 K/uL Final   Eosinophils Relative 11/01/2022 4  % Final   Eosinophils Absolute 11/01/2022 0.4  0.0 - 0.5 K/uL Final  Basophils Relative 11/01/2022 1  % Final   Basophils Absolute 11/01/2022 0.1  0.0 - 0.1 K/uL Final   Immature Granulocytes 11/01/2022 1  % Final   Abs Immature Granulocytes 11/01/2022 0.15 (H)  0.00 - 0.07 K/uL Final   Performed at [facility], [name] [place], [place] [address]   Sodium 11/01/2022 136  135 - 145 mmol/L Final   Potassium 11/01/2022 3.2 (L)  3.5 - 5.1 mmol/L Final   Chloride 11/01/2022 105  98 - 111 mmol/L Final   CO2 11/01/2022 24  22 - 32 mmol/L Final   Glucose, Bld 11/01/2022 242 (H)  70 - 99 mg/dL Final   Glucose reference range applies only to samples taken after fasting for at least 8 hours.   BUN 11/01/2022 8  6 - 20 mg/dL Final   Creatinine, Ser 11/01/2022 0.93  0.44 - 1.00 mg/dL Final   Calcium 72/53/6644 8.6 (L)  8.9 - 10.3 mg/dL Final   Total Protein 03/47/4259 6.9  6.5 - 8.1 g/dL Final   Albumin 56/38/7564 3.6  3.5 - 5.0 g/dL Final   AST 33/29/5188 27  15 - 41 U/L Final   ALT 11/01/2022 27  0 - 44 U/L Final   Alkaline Phosphatase 11/01/2022 61  38 - 126 U/L Final   Total Bilirubin 11/01/2022 0.4  0.3 - 1.2 mg/dL Final   GFR, Estimated 11/01/2022 >60  >60 mL/min Final   Comment: (NOTE) Calculated using the CKD-EPI Creatinine Equation (2021)    Anion gap 11/01/2022 7  5 - 15 Final   Performed at [facility], [address] [place], [place] [address]   Lipase 11/01/2022 64 (H)  11 - 51 U/L Final   Performed at [facility], [address], [place], [place] [address]   Neisseria Gonorrhea 11/01/2022 Negative   Final   Chlamydia 11/01/2022 Negative   Final   Comment 11/01/2022 Normal  Reference Ranger Chlamydia - Negative   Final   Comment 11/01/2022 Normal Reference Range Neisseria Gonorrhea - Negative   Final   Yeast Wet Prep HPF POC 11/02/2022 NONE SEEN  NONE SEEN Final   Trich, Wet Prep 11/02/2022 NONE SEEN  NONE SEEN Final   Clue Cells Wet Prep HPF POC 11/02/2022 NONE SEEN  NONE SEEN Final   WBC, Wet Prep HPF POC 11/02/2022 <10  <10 Final   Swab received with less than 0.5 mL of saline, saline added to specimen, interpret results with caution.   Sperm 11/02/2022 NONE SEEN   Final   Performed at [facility], [address], [place], [place] [address]  There may be more visits with results that are not included.    Allergies: Wellbutrin [bupropion], Omnipaque [iohexol], Penicillins, Atarax [hydroxyzine], Buspar [buspirone], Contrast media [iodinated contrast media], and Cymbalta [duloxetine hcl]  Medications:  Facility Ordered Medications  Medication   [COMPLETED] OLANZapine zydis (ZYPREXA) disintegrating tablet 10 mg   acetaminophen (TYLENOL) tablet 650 mg   alum & mag hydroxide-simeth (MAALOX/MYLANTA) 200-200-20 MG/5ML suspension 30 mL   magnesium hydroxide (MILK OF MAGNESIA) suspension 15 mL   diphenhydrAMINE (BENADRYL) capsule 50 mg   traZODone (DESYREL) tablet 150 mg   PTA Medications  Medication Sig   metoprolol tartrate (LOPRESSOR) 25 MG tablet Take 25 mg by mouth 2 (two) times daily.   amLODipine (NORVASC) 10 MG tablet Take 10 mg by mouth daily.   insulin aspart protamine- aspart (NOVOLOG MIX 70/30) (70-30) 100 UNIT/ML injection Inject 20 Units  into the skin 2 (two) times daily with a meal.   lithium carbonate (ESKALITH) 450 MG ER tablet Take 1 tablet (450 mg total) by mouth at bedtime. For mood stabilization (Patient taking differently: Take 450 mg by mouth 2 (two) times daily. For mood stabilization)   benztropine (COGENTIN) 0.5 MG tablet Take 1 tablet (0.5 mg total) by mouth 2 (two) times daily. For eps   gabapentin (NEURONTIN) 100  MG capsule Take 1 capsule (100 mg total) by mouth 3 (three) times daily. For agitation   venlafaxine XR (EFFEXOR-XR) 150 MG 24 hr capsule Take 150 mg by mouth daily with breakfast.   haloperidol (HALDOL) 10 MG tablet Take 1 tablet (10 mg total) by mouth at bedtime.   ondansetron (ZOFRAN-ODT) 4 MG disintegrating tablet 4mg  ODT q4 hours prn nausea/vomit (Patient not taking: Reported on 01/09/2023)   busPIRone (BUSPAR) 10 MG tablet Take 1 tablet (10 mg total) by mouth 3 (three) times daily. (Patient not taking: Reported on 01/09/2023)   traZODone (DESYREL) 150 MG tablet Take 150 mg by mouth at bedtime.   metroNIDAZOLE (FLAGYL) 500 MG tablet Take 1 tablet (500 mg total) by mouth 2 (two) times daily.   diclofenac Sodium (VOLTAREN) 1 % GEL Apply 4 g topically 4 (four) times daily.      Medical Decision Making  Suicidal ideation in the setting of medication non-adherence Schizoaffective disorder, bipolar type Borderline intellectual functioning  - Admit to [facility] obs, reassess in AM after medication administration - Reach out to ACTT team tomorrow to discuss patient status - Given olanzapine zydis 10mg  x1  - Restarted the following home medications: Amlodipine 10 mg Benztropine 0.5 mg BID Gabapentin 100 mg TID Lithium ER 450 mg  at bedtime Trazodone 150 mg - Agitation PRNs as follows: PRN Olanzapine zydis 5 mg q8h for agitation PRN Lorazepam 1 mg for severe agitation PRN impromidine 20 mg for agitation - Ordered CBC CMP, EtoH, A1c, Lipid panel @500  12/17, lithium level, TSH - EKG: NSR, Qtc 464.   Recommendations  Based on my evaluation the patient appears to have an emergency medical condition for which I recommend the patient be transferred to the emergency department for further evaluation.  Tomie China, MD 04/12/23  4:04 PM
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
## 2023-04-12 NOTE — ED Notes (Signed)
Patient brought onto unit, oriented and provided a meal. Pt belongings placed in locker. Orders obtained with no complication. Patient endorsing active SI with a plan to OD but "I don't have any pills" at this time. Denies HI and AVH at this time. Patient calm and collected, no acute distress noted, no physical complaints at this time. Environment secured, safety checks in place per facility policy.

## 2023-04-12 NOTE — ED Notes (Signed)
 [facility] called for specimen pickup

## 2023-04-12 NOTE — ED Notes (Signed)
Manual BP obtained of 150/60. Provider Tomie China MD made aware. No new orders at this time.

## 2023-04-13 DIAGNOSIS — R45851 Suicidal ideations: Secondary | ICD-10-CM | POA: Diagnosis not present

## 2023-04-13 LAB — GLUCOSE, CAPILLARY
Glucose-Capillary: 203 mg/dL — ABNORMAL HIGH (ref 70–99)
Glucose-Capillary: 279 mg/dL — ABNORMAL HIGH (ref 70–99)

## 2023-04-13 MED ORDER — CLONIDINE HCL 0.1 MG PO TABS: 0.1000 mg | ORAL_TABLET | Freq: Every day | ORAL | 0 refills | Status: DC

## 2023-04-13 MED ORDER — DICLOFENAC SODIUM 1 % EX GEL: 4.0000 g | Freq: Four times a day (QID) | CUTANEOUS | 0 refills | Status: DC

## 2023-04-13 MED ORDER — AMLODIPINE BESYLATE 10 MG PO TABS: 10.0000 mg | ORAL_TABLET | Freq: Every day | ORAL | 0 refills | Status: DC

## 2023-04-13 MED ORDER — GLIMEPIRIDE 4 MG PO TABS: 4.0000 mg | ORAL_TABLET | Freq: Every day | ORAL | 0 refills | Status: DC

## 2023-04-13 MED ORDER — LITHIUM CARBONATE ER 450 MG PO TBCR: 450.0000 mg | EXTENDED_RELEASE_TABLET | Freq: Every day | ORAL | 0 refills | Status: DC

## 2023-04-13 MED ORDER — VENLAFAXINE HCL ER 150 MG PO CP24: 150.0000 mg | ORAL_CAPSULE | Freq: Every day | ORAL | 0 refills | Status: DC

## 2023-04-13 MED ORDER — METOPROLOL TARTRATE 25 MG PO TABS: 25.0000 mg | ORAL_TABLET | Freq: Two times a day (BID) | ORAL | 0 refills | Status: DC

## 2023-04-13 MED ORDER — HALOPERIDOL 10 MG PO TABS: 10.0000 mg | ORAL_TABLET | Freq: Every day | ORAL | Status: DC

## 2023-04-13 MED ORDER — TRAZODONE HCL 150 MG PO TABS: 150.0000 mg | ORAL_TABLET | Freq: Every day | ORAL | 0 refills | Status: AC

## 2023-04-13 MED ORDER — GABAPENTIN 100 MG PO CAPS: 100.0000 mg | ORAL_CAPSULE | Freq: Three times a day (TID) | ORAL | 0 refills | Status: DC

## 2023-04-13 MED ORDER — ATORVASTATIN CALCIUM 20 MG PO TABS: 20.0000 mg | ORAL_TABLET | Freq: Every day | ORAL | 0 refills | Status: DC

## 2023-04-13 MED ORDER — BENZTROPINE MESYLATE 0.5 MG PO TABS: 0.5000 mg | ORAL_TABLET | Freq: Two times a day (BID) | ORAL | 0 refills | Status: DC
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
## 2023-04-13 NOTE — ED Notes (Signed)
Pt up to the nurses station mult times asking when she will be discharged.  Dr. Okey Dupre spoke with pt.   She remains agitated and fixated on discharge.  Pt given prn medication as ordered.

## 2023-04-13 NOTE — ED Notes (Signed)
Patient observed/assessed in bed/chair resting quietly appearing in no distress and verbalizing no complaints at this time. Will continue to monitor.  

## 2023-04-13 NOTE — ED Notes (Signed)
Pt cont to pace in milieu. Cont to be intrusive with peers and staff.  Given prn medication as ordered.

## 2023-04-23 ENCOUNTER — Other Ambulatory Visit: Payer: Self-pay

## 2023-04-23 ENCOUNTER — Encounter (HOSPITAL_COMMUNITY): Payer: Self-pay

## 2023-04-23 ENCOUNTER — Emergency Department (HOSPITAL_COMMUNITY)
Admission: EM | Admit: 2023-04-23 | Discharge: 2023-04-24 | Disposition: A | Discharge status: Home / Self Care | Payer: 59 | Attending: Emergency Medicine | Admitting: Emergency Medicine

## 2023-04-23 DIAGNOSIS — T50904A Poisoning by unspecified drugs, medicaments and biological substances, undetermined, initial encounter: Secondary | ICD-10-CM | POA: Diagnosis not present

## 2023-04-23 DIAGNOSIS — Z720 Tobacco use: Secondary | ICD-10-CM | POA: Diagnosis not present

## 2023-04-23 DIAGNOSIS — F1290 Cannabis use, unspecified, uncomplicated: Secondary | ICD-10-CM | POA: Insufficient documentation

## 2023-04-23 DIAGNOSIS — E119 Type 2 diabetes mellitus without complications: Secondary | ICD-10-CM | POA: Insufficient documentation

## 2023-04-23 DIAGNOSIS — Z79899 Other long term (current) drug therapy: Secondary | ICD-10-CM | POA: Insufficient documentation

## 2023-04-23 DIAGNOSIS — Z794 Long term (current) use of insulin: Secondary | ICD-10-CM | POA: Insufficient documentation

## 2023-04-23 DIAGNOSIS — R112 Nausea with vomiting, unspecified: Secondary | ICD-10-CM | POA: Diagnosis not present

## 2023-04-23 DIAGNOSIS — I10 Essential (primary) hypertension: Secondary | ICD-10-CM | POA: Diagnosis not present

## 2023-04-23 DIAGNOSIS — R110 Nausea: Secondary | ICD-10-CM | POA: Diagnosis not present

## 2023-04-23 DIAGNOSIS — R42 Dizziness and giddiness: Secondary | ICD-10-CM | POA: Diagnosis not present

## 2023-04-23 DIAGNOSIS — T887XXA Unspecified adverse effect of drug or medicament, initial encounter: Secondary | ICD-10-CM | POA: Diagnosis not present

## 2023-04-23 DIAGNOSIS — Z743 Need for continuous supervision: Secondary | ICD-10-CM | POA: Diagnosis not present

## 2023-04-23 NOTE — ED Triage Notes (Signed)
Pt to ED by EMS from home with c/o nausea and lightheadedness after smoking marijuana, pt also endorses lethargy. Arrives A+O, VSS, NADN.

## 2023-04-24 NOTE — Discharge Instructions (Signed)
It was a pleasure caring for you today in the emergency department.  Please return to the emergency department for any worsening or worrisome symptoms.  Please avoid THC in the future

## 2023-04-24 NOTE — ED Provider Notes (Signed)
[place] EMERGENCY DEPARTMENT AT [facility] Provider Note  CSN: [id number] Arrival date & time: 04/23/23 2322  Chief Complaint(s) Nausea  HPI [name] is a 32 y.o. female with past medical history as below, significant for anxiety, bipolar 1, schizophrenia, tobacco use, obesity who presents to the ED with complaint of nausea after smoking marijuana  Patient reports that she smoked marijuana earlier this evening and she felt nauseated and somewhat lightheaded.  The symptoms have since resolved.  She is feeling back to normal.  No abdominal pain, chest pain, fevers or chills.  No ongoing nausea, no vomiting.  No gait disturbances.  She is requesting discharge upon my initial assessment  Past Medical History Past Medical History:  Diagnosis Date   Anxiety    Bipolar 1 disorder (HCC)    Cognitive deficits    Depression    Diabetes mellitus without complication (HCC)    Hypertension    Mental disorder    Mental health disorder    Obesity    Patient Active Problem List   Diagnosis Date Noted   Agitation 09/28/2022   Suicidal ideations 01/04/2022   Suicidal overdose (HCC) 03/23/2021   Syphilis 07/15/2020   Malingering 06/05/2020   Gastroesophageal reflux disease 05/04/2020   Hyperglycemia due to type 2 diabetes mellitus (HCC) 05/04/2020   Long term (current) use of insulin (HCC) 05/04/2020   Migraine without aura 05/04/2020   Morbid obesity (HCC) 05/04/2020   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 2 diabetes mellitus (HCC) 05/04/2020   Prolapsed internal hemorrhoids 05/04/2020   Vitamin D deficiency 05/04/2020   Other symptoms and signs involving cognitive functions and awareness 05/04/2020   Suicide attempt (HCC)    Anxiety state 03/06/2020   Schizophrenia (HCC) 09/13/2019   Bipolar I disorder, most recent episode depressed (HCC) 06/23/2019   MDD (major depressive disorder) 10/10/2018   Schizoaffective disorder, bipolar type (HCC) 09/25/2018   Affective psychosis,  bipolar (HCC) 06/13/2018   HTN (hypertension) 05/03/2018   Tobacco use disorder 05/03/2018   Adjustment disorder with emotional disturbance 01/02/2018   Schizophrenia, 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   Moderate bipolar I disorder, most recent episode depressed (HCC)    Schizoaffective disorder (HCC)    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/2017   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   OCD (obsessive compulsive disorder) 10/05/2016   Major depressive disorder, recurrent episode, mild (HCC) 05/04/2016   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 Authorizing Provider  amLODipine (NORVASC) 10 MG tablet Take 1 tablet (10 mg total) by mouth daily. 04/13/23   Tomie China, MD  atorvastatin (LIPITOR) 20 MG tablet Take 1 tablet (20 mg total) by mouth at bedtime. 04/13/23   Tomie China, MD  benztropine (COGENTIN) 0.5 MG tablet Take 1 tablet (0.5 mg total) by mouth 2 (two) times daily. For eps 04/13/23   Tomie China, MD  cloNIDine (CATAPRES) 0.1 MG tablet Take 1 tablet (0.1 mg total) by mouth daily. 04/13/23   Tomie China, MD  diclofenac Sodium (VOLTAREN) 1 % GEL Apply 4 g topically 4 (four) times daily. 04/13/23   Tomie China, MD  gabapentin (NEURONTIN) 100 MG capsule Take 1 capsule (100 mg total) by mouth 3 (three) times daily. For agitation 04/13/23   Tomie China, MD  glimepiride (AMARYL) 4 MG tablet Take 1 tablet (4 mg total) by mouth daily. 04/13/23   Tomie China, MD  GLOBAL EASE INJECT PEN NEEDLES 32G X 4 MM MISC Inject 1 each into the skin as directed. 10/20/22   [provider]  haloperidol (HALDOL) 10 MG tablet Take 1 tablet (10 mg total) by mouth at bedtime for 14 days. 04/13/23 04/27/23  Tomie China, MD  lithium carbonate (ESKALITH) 450 MG ER tablet Take 1 tablet (450 mg total) by mouth at bedtime. For mood stabilization 04/13/23   Tomie China, MD  metoprolol tartrate (LOPRESSOR) 25 MG tablet Take 1 tablet (25 mg total) by mouth 2 (two) times daily for 14 days. 04/13/23 04/27/23  Tomie China, MD  traZODone (DESYREL) 150 MG tablet Take 1 tablet (150 mg total) by mouth at bedtime. 04/13/23   Tomie China, MD  TRESIBA FLEXTOUCH 100 UNIT/ML FlexTouch Pen Inject 20 Units into the skin at bedtime. 03/04/23   [provider]  venlafaxine XR (EFFEXOR-XR) 150 MG 24 hr capsule Take 1 capsule (150 mg total) by mouth daily with breakfast. 04/13/23   Tomie China, MD                                                                                                                                    Past Surgical History Past Surgical History:  Procedure Laterality Date   CESAREAN SECTION     CESAREAN SECTION N/A 04/25/2013   Procedure: REPEAT CESAREAN SECTION;  Surgeon: Catalina Antigua, MD;  Location: [facility];  Service: Obstetrics;  Laterality: N/A;   MASS EXCISION N/A 06/03/2012   Procedure: EXCISION MASS;  Surgeon: Osborn Coho, MD;  Location: [facility];  Service: ENT;  Laterality: N/A;  Excision uvula mass   TONSILLECTOMY N/A 06/03/2012   Procedure: TONSILLECTOMY;  Surgeon: Osborn Coho, MD;  Location: [facility];  Service: ENT;  Laterality: N/A;   TONSILLECTOMY     Family History Family History  Problem Relation Age of Onset   Hypertension Mother    Diabetes  Father     Social History Social History   Tobacco Use   Smoking status: Every Day    Current packs/day: 1.00    Types: Cigars, Cigarettes   Smokeless tobacco: Never   Tobacco comments:    Pt declined  Vaping Use   Vaping status: Never Used  Substance Use Topics   Alcohol use: Not Currently   Drug use: Not Currently    Types: "Crack" cocaine, Other-see comments, Marijuana   Allergies Wellbutrin [bupropion], Omnipaque [iohexol], Penicillins, Atarax [hydroxyzine], Buspar [buspirone], Contrast media [iodinated contrast media], and Cymbalta [duloxetine hcl]  Review of Systems Review of Systems  Constitutional:  Negative for chills and fever.  Respiratory:  Negative for chest tightness.   Cardiovascular:  Negative for chest pain.  Gastrointestinal:  Positive for nausea. Negative for abdominal pain.  Skin:  Negative for rash.  Neurological:  Positive for light-headedness.  All other systems reviewed and are negative.   Physical Exam Vital Signs  I have reviewed the triage vital signs BP (!) 131/109   Pulse 96   Temp 98.9 F (37.2 C) (Oral)   Resp 17   SpO2 97%  Physical Exam Vitals and nursing note reviewed.  Constitutional:      General: She is not in acute distress.    Appearance: Normal appearance. She is well-developed. She is not ill-appearing.  HENT:     Head: Normocephalic and atraumatic.     Right Ear: External ear normal.     Left Ear: External ear normal.     Nose: Nose normal.     Mouth/Throat:     Mouth: Mucous membranes are moist.  Eyes:     General: No scleral icterus.       Right eye: No discharge.        Left eye: No discharge.  Cardiovascular:     Rate and Rhythm: Normal rate.  Pulmonary:     Effort: Pulmonary effort is normal. No respiratory distress.     Breath sounds: No stridor.  Abdominal:     General: Abdomen is flat. There is no distension.     Palpations: Abdomen is soft.     Tenderness: There is no abdominal tenderness. There is  no guarding.  Musculoskeletal:        General: No deformity.     Cervical back: No rigidity.  Skin:    General: Skin is warm and dry.     Coloration: Skin is not cyanotic, jaundiced or pale.  Neurological:     Mental Status: She is alert and oriented to person, place, and time.     GCS: GCS eye subscore is 4. GCS verbal subscore is 5. GCS motor subscore is 6.  Psychiatric:        Speech: Speech normal.        Behavior: Behavior normal. Behavior is cooperative.     ED Results and Treatments Labs (all labs ordered are listed, but only abnormal results are displayed) Labs Reviewed - No data to display                                                                                                                        Radiology No results found.  Pertinent labs & imaging results that were available during my care of the patient were reviewed by me and considered in my medical decision making (see MDM for details).  Medications Ordered in ED Medications - No data to display  Procedures Procedures  (including critical care time)  Medical Decision Making / ED Course    Medical Decision Making:    Verley Jaskiran Nauer is a 32 y.o. female with past medical history as below, significant for anxiety, bipolar 1, schizophrenia, tobacco use, obesity who presents to the ED with complaint of nausea after smoking marijuana. The complaint involves an extensive differential diagnosis and also carries with it a high risk of complications and morbidity.  Serious etiology was considered. Ddx includes but is not limited to: THC intoxication, coingestion, viral syndrome, stridorous etc.  Complete initial physical exam performed, notably the patient was in no distress, asymptomatic.    Reviewed and confirmed nursing documentation for past medical history, family history,  social history.  Vital signs reviewed.         Brief summary: 32 year old female history as above, well-known to this facility, here with lightheadedness, nausea after smoking marijuana.  Symptoms have resolved at this point.  Abdomen is benign.  No longer having any nausea.  Feels back to normal.  She is feeling better, she is eager for discharge.  Encouraged THC cessation  Symptoms resolved, no other concerns verbalized, she is eager for discharge.  The patient improved significantly and was discharged in stable condition. Detailed discussions were had with the patient regarding current findings, and need for close f/u with PCP or on call doctor. The patient has been instructed to return immediately if the symptoms worsen in any way for re-evaluation. Patient verbalized understanding and is in agreement with current care plan. All questions answered prior to discharge.                Additional history obtained: -Additional history obtained from na -External records from outside source obtained and reviewed including: Chart review including previous notes, labs, imaging, consultation notes including  Prior ED visits, home medications, prior labs   Lab Tests: na  EKG   EKG Interpretation Date/Time:    Ventricular Rate:    PR Interval:    QRS Duration:    QT Interval:    QTC Calculation:   R Axis:      Text Interpretation:           Imaging Studies ordered: na   Medicines ordered and prescription drug management: No orders of the defined types were placed in this encounter.   -I have reviewed the patients home medicines and have made adjustments as needed   Consultations Obtained: na   Cardiac Monitoring: Continuous pulse oximetry interpreted by myself, 99% on RA.    Social Determinants of Health:  Diagnosis or treatment significantly limited by social determinants of health: current smoker and obesity Counseled patient for approximately   minutes regarding smoking cessation. Discussed risks of smoking and how they applied and affected their visit here today. Patient not ready to quit at this time, however will follow up with their primary doctor when they are.   CPT code: 16109: intermediate counseling for smoking cessation     Reevaluation: After the interventions noted above, I reevaluated the patient and found that they have resolved  Co morbidities that complicate the patient evaluation  Past Medical History:  Diagnosis Date   Anxiety    Bipolar 1 disorder (HCC)    Cognitive deficits    Depression    Diabetes mellitus without complication (HCC)    Hypertension    Mental disorder    Mental health disorder    Obesity       Dispostion: Disposition decision  including need for hospitalization was considered, and patient discharged from emergency department.    Final Clinical Impression(s) / ED Diagnoses Final diagnoses:  Nausea  Marijuana use        Sloan Leiter, DO 04/24/23 [phone]

## 2023-05-02 ENCOUNTER — Other Ambulatory Visit: Payer: Self-pay

## 2023-05-02 ENCOUNTER — Encounter (HOSPITAL_BASED_OUTPATIENT_CLINIC_OR_DEPARTMENT_OTHER): Payer: Self-pay | Admitting: Emergency Medicine

## 2023-05-02 ENCOUNTER — Emergency Department (HOSPITAL_BASED_OUTPATIENT_CLINIC_OR_DEPARTMENT_OTHER)
Admission: EM | Admit: 2023-05-02 | Discharge: 2023-05-02 | Disposition: A | Discharge status: Home / Self Care | Payer: 59 | Attending: Emergency Medicine | Admitting: Emergency Medicine

## 2023-05-02 DIAGNOSIS — Z794 Long term (current) use of insulin: Secondary | ICD-10-CM | POA: Insufficient documentation

## 2023-05-02 DIAGNOSIS — R000 Tachycardia, unspecified: Secondary | ICD-10-CM | POA: Diagnosis not present

## 2023-05-02 DIAGNOSIS — F1490 Cocaine use, unspecified, uncomplicated: Secondary | ICD-10-CM | POA: Diagnosis not present

## 2023-05-02 DIAGNOSIS — F1410 Cocaine abuse, uncomplicated: Secondary | ICD-10-CM | POA: Diagnosis not present

## 2023-05-02 DIAGNOSIS — I10 Essential (primary) hypertension: Secondary | ICD-10-CM | POA: Diagnosis not present

## 2023-05-02 NOTE — ED Provider Notes (Signed)
 [place] EMERGENCY DEPARTMENT AT [facility] Provider Note   CSN: [id number] Arrival date & time: 05/02/23  1813     History  Chief Complaint  Patient presents with   Drug Problem    [name] is a 32 y.o. female.  Patient reports that she had been feeling well since cocaine use last night.  She denies SI or HI.  Upon my evaluation she is asking to leave.  She came by ambulance.  She is not having any chest pain.  She is felt short of breath at times.  She has had some anxiety.  Overall she is asking to leave.  She is not participating other questions otherwise at this time.  The history is provided by the patient.       Home Medications Prior to Admission medications   Medication Sig Start Date End Date Taking? Authorizing Provider  amLODipine  (NORVASC ) 10 MG tablet Take 1 tablet (10 mg total) by mouth daily. 04/13/23   Rollene Katz, MD  atorvastatin  (LIPITOR) 20 MG tablet Take 1 tablet (20 mg total) by mouth at bedtime. 04/13/23   Rollene Katz, MD  benztropine  (COGENTIN ) 0.5 MG tablet Take 1 tablet (0.5 mg total) by mouth 2 (two) times daily. For eps 04/13/23   Rollene Katz, MD  cloNIDine  (CATAPRES ) 0.1 MG tablet Take 1 tablet (0.1 mg total) by mouth daily. 04/13/23   Rollene Katz, MD  diclofenac  Sodium (VOLTAREN ) 1 % GEL Apply 4 g topically 4 (four) times daily. 04/13/23   Rollene Katz, MD  gabapentin  (NEURONTIN ) 100 MG capsule Take 1 capsule (100 mg total) by mouth 3 (three) times daily. For agitation 04/13/23   Rollene Katz, MD  glimepiride  (AMARYL ) 4 MG tablet Take 1 tablet (4 mg total) by mouth daily. 04/13/23   Rollene Katz, MD  GLOBAL EASE INJECT PEN NEEDLES 32G X 4 MM MISC Inject 1 each into the skin as directed. 10/20/22   [provider]  haloperidol  (HALDOL ) 10 MG tablet Take 1 tablet (10 mg total) by mouth at bedtime for 14 days. 04/13/23 04/27/23  Rollene Katz, MD  lithium  carbonate (ESKALITH )  450 MG ER tablet Take 1 tablet (450 mg total) by mouth at bedtime. For mood stabilization 04/13/23   Rollene Katz, MD  metoprolol  tartrate (LOPRESSOR ) 25 MG tablet Take 1 tablet (25 mg total) by mouth 2 (two) times daily for 14 days. 04/13/23 04/27/23  Rollene Katz, MD  traZODone  (DESYREL ) 150 MG tablet Take 1 tablet (150 mg total) by mouth at bedtime. 04/13/23   Rollene Katz, MD  TRESIBA FLEXTOUCH 100 UNIT/ML FlexTouch Pen Inject 20 Units into the skin at bedtime. 03/04/23   [provider]  venlafaxine  XR (EFFEXOR -XR) 150 MG 24 hr capsule Take 1 capsule (150 mg total) by mouth daily with breakfast. 04/13/23   Rollene Katz, MD      Allergies    Wellbutrin  [bupropion ], Omnipaque  [iohexol ], Penicillins, Atarax  [hydroxyzine ], Buspar  [buspirone ], Contrast media [iodinated contrast media], and Cymbalta  [duloxetine  hcl]    Review of Systems   Review of Systems  Physical Exam Updated Vital Signs BP (!) 143/98   Pulse (!) 106   Temp 98 F (36.7 C)   Resp (!) 30   LMP 04/11/2023   SpO2 98%  Physical Exam Vitals and nursing note reviewed.  Constitutional:      General: She is not in acute distress.    Appearance: She is well-developed.  HENT:     Head: Normocephalic and atraumatic.  Eyes:  Conjunctiva/sclera: Conjunctivae normal.  Cardiovascular:     Rate and Rhythm: Normal rate and regular rhythm.     Pulses: Normal pulses.     Heart sounds: Normal heart sounds. No murmur heard. Pulmonary:     Effort: Pulmonary effort is normal. No respiratory distress.     Breath sounds: Normal breath sounds.  Abdominal:     Palpations: Abdomen is soft.     Tenderness: There is no abdominal tenderness.  Musculoskeletal:        General: No swelling.     Cervical back: Neck supple.  Skin:    General: Skin is warm and dry.     Capillary Refill: Capillary refill takes less than 2 seconds.  Neurological:     Mental Status: She is alert.  Psychiatric:         Mood and Affect: Mood normal.     ED Results / Procedures / Treatments   Labs (all labs ordered are listed, but only abnormal results are displayed) Labs Reviewed - No data to display  EKG EKG Interpretation Date/Time:  Sunday May 02 2023 18:23:13 EST Ventricular Rate:  106 PR Interval:  164 QRS Duration:  90 QT Interval:  346 QTC Calculation: 460 R Axis:   64  Text Interpretation: Sinus tachycardia Borderline T wave abnormalities Confirmed by Ruthe Cornet [phone]) on 05/02/2023 6:26:40 PM  Radiology No results found.  Procedures Procedures    Medications Ordered in ED Medications - No data to display  ED Course/ Medical Decision Making/ A&P                                 Medical Decision Making  [name] is here after not feeling well after using cocaine last night.  She has overall unremarkable vitals.  She appears mildly anxious on exam.  During my discussion with the patient she is asking to leave.  She already had EKG done that shows sinus tachycardia.  No ischemic changes.  She denies any SI or HI.  She denied any shortness of breath or chest pain on my evaluation.  For some reason she does not want to stay for evaluation any longer and is not going to participate in any further workup.  At this time patient left the ED.  Overall she is leaving AMA although I think she is likely fine.  I offered her chest x-ray and blood work but was declined.        Final Clinical Impression(s) / ED Diagnoses Final diagnoses:  Cocaine abuse [facility])    Rx / DC Orders ED Discharge Orders     None         Ruthe Cornet, DO 05/02/23 1900

## 2023-05-02 NOTE — ED Triage Notes (Addendum)
 Pt bib [facility], reports "I think I od'd on cocaine last night". Pt reports "feeling hot and terrible". Also endorses shob. Pt visibly anxious

## 2023-05-02 NOTE — ED Notes (Signed)
 2 unsuccessful IV attempts, RT hand, and RAC, pt tolerated well

## 2023-05-25 ENCOUNTER — Emergency Department (HOSPITAL_COMMUNITY)
Admission: EM | Admit: 2023-05-25 | Discharge: 2023-05-26 | Discharge status: Against Medical Advice | Payer: 59 | Attending: Emergency Medicine | Admitting: Emergency Medicine

## 2023-05-25 ENCOUNTER — Encounter (HOSPITAL_COMMUNITY): Payer: Self-pay | Admitting: *Deleted

## 2023-05-25 ENCOUNTER — Other Ambulatory Visit: Payer: Self-pay

## 2023-05-25 DIAGNOSIS — R102 Pelvic and perineal pain: Secondary | ICD-10-CM | POA: Insufficient documentation

## 2023-05-25 DIAGNOSIS — R21 Rash and other nonspecific skin eruption: Secondary | ICD-10-CM | POA: Insufficient documentation

## 2023-05-25 DIAGNOSIS — Z5321 Procedure and treatment not carried out due to patient leaving prior to being seen by health care provider: Secondary | ICD-10-CM | POA: Diagnosis not present

## 2023-05-25 DIAGNOSIS — N898 Other specified noninflammatory disorders of vagina: Secondary | ICD-10-CM | POA: Diagnosis not present

## 2023-05-25 DIAGNOSIS — R739 Hyperglycemia, unspecified: Secondary | ICD-10-CM | POA: Diagnosis not present

## 2023-05-25 DIAGNOSIS — Z743 Need for continuous supervision: Secondary | ICD-10-CM | POA: Diagnosis not present

## 2023-05-25 LAB — URINALYSIS, ROUTINE W REFLEX MICROSCOPIC
Bilirubin Urine: NEGATIVE
Glucose, UA: >=500 mg/dL — AB
Hgb urine dipstick: NEGATIVE
Ketones, ur: NEGATIVE mg/dL
Leukocytes,Ua: NEGATIVE
Nitrite: NEGATIVE
Protein, ur: NEGATIVE mg/dL
Specific Gravity, Urine: 1.011 (ref 1.005–1.030)
pH: 6 (ref 5.0–8.0)

## 2023-05-25 NOTE — ED Triage Notes (Addendum)
Pt says that she has broke out in a rash in her groin area. White vaginal discharge.

## 2023-05-25 NOTE — ED Triage Notes (Signed)
Arrives from home via [facility]. Per report, pt C/o having pain and sores around her vaginal area. Hx of syphilis. No bleeding. No urinary pain. CBG 271 (getting low on her insulin). En route 138/74, hr 110, rr 22, 98% ra.

## 2023-05-26 NOTE — ED Notes (Signed)
Pt states she is having suicidal thought and started at me for 5 min with scary eyes.

## 2023-06-10 ENCOUNTER — Other Ambulatory Visit: Payer: Self-pay

## 2023-06-10 ENCOUNTER — Emergency Department (HOSPITAL_BASED_OUTPATIENT_CLINIC_OR_DEPARTMENT_OTHER): Payer: 59

## 2023-06-10 ENCOUNTER — Emergency Department (HOSPITAL_BASED_OUTPATIENT_CLINIC_OR_DEPARTMENT_OTHER)
Admission: EM | Admit: 2023-06-10 | Discharge: 2023-06-10 | Disposition: A | Discharge status: Home / Self Care | Payer: 59 | Attending: Emergency Medicine | Admitting: Emergency Medicine

## 2023-06-10 DIAGNOSIS — I10 Essential (primary) hypertension: Secondary | ICD-10-CM | POA: Insufficient documentation

## 2023-06-10 DIAGNOSIS — Z7984 Long term (current) use of oral hypoglycemic drugs: Secondary | ICD-10-CM | POA: Insufficient documentation

## 2023-06-10 DIAGNOSIS — R109 Unspecified abdominal pain: Secondary | ICD-10-CM | POA: Insufficient documentation

## 2023-06-10 DIAGNOSIS — E119 Type 2 diabetes mellitus without complications: Secondary | ICD-10-CM | POA: Diagnosis not present

## 2023-06-10 DIAGNOSIS — Z79899 Other long term (current) drug therapy: Secondary | ICD-10-CM | POA: Diagnosis not present

## 2023-06-10 LAB — URINALYSIS, ROUTINE W REFLEX MICROSCOPIC
Bacteria, UA: NONE SEEN
Bilirubin Urine: NEGATIVE
Glucose, UA: NEGATIVE mg/dL
Ketones, ur: NEGATIVE mg/dL
Leukocytes,Ua: NEGATIVE
Nitrite: NEGATIVE
Protein, ur: NEGATIVE mg/dL
Specific Gravity, Urine: 1.013 (ref 1.005–1.030)
pH: 5.5 (ref 5.0–8.0)

## 2023-06-10 LAB — COMPREHENSIVE METABOLIC PANEL
ALT: 20 U/L (ref 0–44)
AST: 21 U/L (ref 15–41)
Albumin: 4.4 g/dL (ref 3.5–5.0)
Alkaline Phosphatase: 47 U/L (ref 38–126)
Anion gap: 6 (ref 5–15)
BUN: 10 mg/dL (ref 6–20)
CO2: 26 mmol/L (ref 22–32)
Calcium: 9 mg/dL (ref 8.9–10.3)
Chloride: 105 mmol/L (ref 98–111)
Creatinine, Ser: 0.89 mg/dL (ref 0.44–1.00)
GFR, Estimated: >60 mL/min (ref >60)
Glucose, Bld: 205 mg/dL — ABNORMAL HIGH (ref 70–99)
Potassium: 3.8 mmol/L (ref 3.5–5.1)
Sodium: 137 mmol/L (ref 135–145)
Total Bilirubin: 0.3 mg/dL (ref 0.0–1.2)
Total Protein: 7.2 g/dL (ref 6.5–8.1)

## 2023-06-10 LAB — CBC
HCT: 35.9 % — ABNORMAL LOW (ref 36.0–46.0)
Hemoglobin: 11.4 g/dL — ABNORMAL LOW (ref 12.0–15.0)
MCH: 25.1 pg — ABNORMAL LOW (ref 26.0–34.0)
MCHC: 31.8 g/dL (ref 30.0–36.0)
MCV: 78.9 fL — ABNORMAL LOW (ref 80.0–100.0)
Platelets: 243 10*3/uL (ref 150–400)
RBC: 4.55 MIL/uL (ref 3.87–5.11)
RDW: 15.8 % — ABNORMAL HIGH (ref 11.5–15.5)
WBC: 10.1 10*3/uL (ref 4.0–10.5)
nRBC: 0 % (ref 0.0–0.2)

## 2023-06-10 LAB — LIPASE, BLOOD: Lipase: 88 U/L — ABNORMAL HIGH (ref 11–51)

## 2023-06-10 LAB — PREGNANCY, URINE: Preg Test, Ur: NEGATIVE

## 2023-06-10 MED ORDER — TRAMADOL HCL 50 MG PO TABS
50.0000 mg | ORAL_TABLET | Freq: Once | ORAL | Status: AC
  Administered 2023-06-10: 50 mg via ORAL
  Filled 2023-06-10: qty 1

## 2023-06-10 NOTE — ED Provider Notes (Signed)
[place] EMERGENCY DEPARTMENT AT [facility] Provider Note   CSN: [id number] Arrival date & time: 06/10/23  0046     History  Chief Complaint  Patient presents with   Abdominal Pain    [name] is a 33 y.o. female.  Patient is a 33 year old female with past medical history of schizophrenia, OCD, diabetes, hypertension, hyperlipidemia, obesity.  Patient presenting today with complaints of abdominal pain.  She describes pain to the lower abdomen for the past 2 weeks.  No vomiting or diarrhea.  No constipation.  No fevers or chills pain worse with movement and palpation.  No alleviating factors.  The history is provided by the patient.       Home Medications Prior to Admission medications   Medication Sig Start Date End Date Taking? Authorizing Provider  amLODipine (NORVASC) 10 MG tablet Take 1 tablet (10 mg total) by mouth daily. 04/13/23   Tomie China, MD  atorvastatin (LIPITOR) 20 MG tablet Take 1 tablet (20 mg total) by mouth at bedtime. 04/13/23   Tomie China, MD  benztropine (COGENTIN) 0.5 MG tablet Take 1 tablet (0.5 mg total) by mouth 2 (two) times daily. For eps 04/13/23   Tomie China, MD  cloNIDine (CATAPRES) 0.1 MG tablet Take 1 tablet (0.1 mg total) by mouth daily. 04/13/23   Tomie China, MD  diclofenac Sodium (VOLTAREN) 1 % GEL Apply 4 g topically 4 (four) times daily. 04/13/23   Tomie China, MD  gabapentin (NEURONTIN) 100 MG capsule Take 1 capsule (100 mg total) by mouth 3 (three) times daily. For agitation 04/13/23   Tomie China, MD  glimepiride (AMARYL) 4 MG tablet Take 1 tablet (4 mg total) by mouth daily. 04/13/23   Tomie China, MD  GLOBAL EASE INJECT PEN NEEDLES 32G X 4 MM MISC Inject 1 each into the skin as directed. 10/20/22   [provider]  haloperidol (HALDOL) 10 MG tablet Take 1 tablet (10 mg total) by mouth at bedtime for 14 days. 04/13/23 04/27/23  Tomie China, MD  lithium  carbonate (ESKALITH) 450 MG ER tablet Take 1 tablet (450 mg total) by mouth at bedtime. For mood stabilization 04/13/23   Tomie China, MD  metoprolol tartrate (LOPRESSOR) 25 MG tablet Take 1 tablet (25 mg total) by mouth 2 (two) times daily for 14 days. 04/13/23 04/27/23  Tomie China, MD  traZODone (DESYREL) 150 MG tablet Take 1 tablet (150 mg total) by mouth at bedtime. 04/13/23   Tomie China, MD  TRESIBA FLEXTOUCH 100 UNIT/ML FlexTouch Pen Inject 20 Units into the skin at bedtime. 03/04/23   [provider]  venlafaxine XR (EFFEXOR-XR) 150 MG 24 hr capsule Take 1 capsule (150 mg total) by mouth daily with breakfast. 04/13/23   Tomie China, MD      Allergies    Wellbutrin [bupropion], Omnipaque [iohexol], Penicillins, Atarax [hydroxyzine], Buspar [buspirone], Contrast media [iodinated contrast media], and Cymbalta [duloxetine hcl]    Review of Systems   Review of Systems  All other systems reviewed and are negative.   Physical Exam Updated Vital Signs BP 125/88 (BP Location: Right Arm)   Pulse 80   Temp (!) 96.8 F (36 C)   Resp 16   Ht 5\' 7"  (1.702 m)   Wt 121.1 kg   SpO2 95%   BMI 41.82 kg/m  Physical Exam Vitals and nursing note reviewed.  Constitutional:      General: She is not in acute distress.    Appearance: She is well-developed. She is  not diaphoretic.  HENT:     Head: Normocephalic and atraumatic.  Cardiovascular:     Rate and Rhythm: Normal rate and regular rhythm.     Heart sounds: No murmur heard.    No friction rub. No gallop.  Pulmonary:     Effort: Pulmonary effort is normal. No respiratory distress.     Breath sounds: Normal breath sounds. No wheezing.  Abdominal:     General: Bowel sounds are normal. There is no distension.     Palpations: Abdomen is soft.     Tenderness: There is abdominal tenderness in the periumbilical area and suprapubic area. There is no right CVA tenderness, left CVA tenderness, guarding or  rebound.  Musculoskeletal:        General: Normal range of motion.     Cervical back: Normal range of motion and neck supple.  Skin:    General: Skin is warm and dry.  Neurological:     General: No focal deficit present.     Mental Status: She is alert and oriented to person, place, and time.     ED Results / Procedures / Treatments   Labs (all labs ordered are listed, but only abnormal results are displayed) Labs Reviewed  LIPASE, BLOOD - Abnormal; Notable for the following components:      Result Value   Lipase 88 (*)    All other components within normal limits  COMPREHENSIVE METABOLIC PANEL - Abnormal; Notable for the following components:   Glucose, Bld 205 (*)    All other components within normal limits  CBC - Abnormal; Notable for the following components:   Hemoglobin 11.4 (*)    HCT 35.9 (*)    MCV 78.9 (*)    MCH 25.1 (*)    RDW 15.8 (*)    All other components within normal limits  URINALYSIS, ROUTINE W REFLEX MICROSCOPIC - Abnormal; Notable for the following components:   Color, Urine COLORLESS (*)    Hgb urine dipstick SMALL (*)    All other components within normal limits  PREGNANCY, URINE    EKG None  Radiology No results found.  Procedures Procedures    Medications Ordered in ED Medications  traMADol (ULTRAM) tablet 50 mg (has no administration in time range)    ED Course/ Medical Decision Making/ A&P  Patient is a 33 year old female with past medical history as described in the HPI.  Patient presenting with lower abdominal pain.  Patient arrives here with stable vital signs and is afebrile.  She is tender to palpation in the suprapubic region and periumbilical region, but there are no peritoneal signs.  Laboratory studies obtained including CBC, CMP, and lipase.  All of these are unremarkable.  Urinalysis is clear and pregnancy test is negative.  CT scan of the abdomen and pelvis obtained showing no acute intra-abdominal process.  Patient  given 1 dose of tramadol for pain and seems to be feeling better.  The cause of her pain is somewhat unclear, however she has been to the emergency department in the past with similar complaints and nothing has been found.  I feel comfortable that there is nothing emergent and that patient can safely be discharged.  I will advise her to take ibuprofen, rest, and follow-up with her primary doctor if she is not feeling any better.  Final Clinical Impression(s) / ED Diagnoses Final diagnoses:  None    Rx / DC Orders ED Discharge Orders     None  Geoffery Lyons, MD 06/10/23 [phone]

## 2023-06-10 NOTE — ED Triage Notes (Signed)
Pt POV reporting lower abd pain x2 weeks, concerned for ovarian cyst or fibroids, denies nvd.

## 2023-06-10 NOTE — Discharge Instructions (Signed)
Take ibuprofen 600 mg every 6 hours as needed for pain.  Follow-up with your primary doctor if symptoms are not improving.

## 2023-06-21 ENCOUNTER — Ambulatory Visit (HOSPITAL_COMMUNITY)
Admission: EM | Admit: 2023-06-21 | Discharge: 2023-06-22 | Discharge status: Against Medical Advice | Payer: MEDICAID | Attending: Psychiatry | Admitting: Psychiatry

## 2023-06-21 DIAGNOSIS — R4589 Other symptoms and signs involving emotional state: Secondary | ICD-10-CM | POA: Diagnosis not present

## 2023-06-21 DIAGNOSIS — F79 Unspecified intellectual disabilities: Secondary | ICD-10-CM | POA: Diagnosis not present

## 2023-06-21 DIAGNOSIS — F17200 Nicotine dependence, unspecified, uncomplicated: Secondary | ICD-10-CM | POA: Insufficient documentation

## 2023-06-21 DIAGNOSIS — R4689 Other symptoms and signs involving appearance and behavior: Secondary | ICD-10-CM

## 2023-06-21 DIAGNOSIS — F411 Generalized anxiety disorder: Secondary | ICD-10-CM | POA: Diagnosis not present

## 2023-06-21 DIAGNOSIS — F329 Major depressive disorder, single episode, unspecified: Secondary | ICD-10-CM | POA: Insufficient documentation

## 2023-06-21 DIAGNOSIS — R443 Hallucinations, unspecified: Secondary | ICD-10-CM | POA: Insufficient documentation

## 2023-06-21 DIAGNOSIS — F22 Delusional disorders: Secondary | ICD-10-CM | POA: Insufficient documentation

## 2023-06-21 LAB — POCT URINE DRUG SCREEN - MANUAL ENTRY (I-SCREEN)
POC Amphetamine UR: NOT DETECTED
POC Buprenorphine (BUP): NOT DETECTED
POC Cocaine UR: NOT DETECTED
POC Marijuana UR: NOT DETECTED
POC Methadone UR: NOT DETECTED
POC Methamphetamine UR: NOT DETECTED
POC Morphine: NOT DETECTED
POC Oxazepam (BZO): NOT DETECTED
POC Oxycodone UR: NOT DETECTED
POC Secobarbital (BAR): NOT DETECTED

## 2023-06-21 LAB — POC URINE PREG, ED: Preg Test, Ur: NEGATIVE

## 2023-06-21 MED ORDER — MAGNESIUM HYDROXIDE 400 MG/5ML PO SUSP: 30.0000 mL | Freq: Every day | ORAL | Status: DC | PRN

## 2023-06-21 MED ORDER — ACETAMINOPHEN 325 MG PO TABS: 650.0000 mg | ORAL_TABLET | Freq: Four times a day (QID) | ORAL | Status: DC | PRN

## 2023-06-21 MED ORDER — OLANZAPINE 10 MG IM SOLR: 5.0000 mg | Freq: Three times a day (TID) | INTRAMUSCULAR | Status: DC | PRN

## 2023-06-21 MED ORDER — OLANZAPINE 5 MG PO TBDP: 5.0000 mg | ORAL_TABLET | Freq: Three times a day (TID) | ORAL | Status: DC | PRN

## 2023-06-21 MED ORDER — ALUM & MAG HYDROXIDE-SIMETH 200-200-20 MG/5ML PO SUSP: 30.0000 mL | ORAL | Status: DC | PRN

## 2023-06-21 MED ORDER — OLANZAPINE 10 MG IM SOLR: 10.0000 mg | Freq: Three times a day (TID) | INTRAMUSCULAR | Status: DC | PRN
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
## 2023-06-21 NOTE — ED Provider Notes (Signed)
 [facility] Continuous Assessment Admission H&P  Date: 06/21/23 Patient Name: [name] MRN: [id number] Chief Complaint: there are demons in my apartment  Diagnoses:  Final diagnoses:  Paranoia [facility])  Aggression aggravated  Anxious appearance    HPI: [name], 33 y/o female with a history of suicidal ideation, GAD, MDD, borderline intellectual disability.  Presented to [facility] via GPD.  Per the patient she [place] because she was being loud yesterday patient also stated that there are demons in which is in her apartment particularly in the bathroom and she stated that her neighbors put the demons under which is in there.  Patient is well-known to the behavioral health urgent care.  Patient does have an ACT team however patient stated that she wants her medication change writer discussed with patient that she need to reach out to the ACT team and have them changed her medication.   Face-to-face evaluation of patient, patient is alert and oriented x 4, speech is clear, maintain eye contact.  Patient can be a little bit loud at times however was very cooperative and easily redirected.  Patient denies SI, HI, denies illicit drug use or wanting to hurt anyone.  Patient does not appear to be in any immediate distress.  Patient is just a little bit angry.  Patient reports she smoked tobacco products.  Patient does have some paranoia and does appear to be very angry, hallucination and anxious.  Patient stated that she does not feel comfortable returning to her apartment because of the witchcraft and the demons. At this time patient does not seem to be influenced by Axis internal stimuli.  Discussed with patient that we will admit her and reassess her in the AM.  Patient in agreement with plan  Pt had recent lab work done on 06/10/23 Recommend observation  Total Time spent with patient: 20 minutes  Musculoskeletal  Strength & Muscle Tone: within normal limits Gait &  Station: normal Patient leans: N/A  Psychiatric Specialty Exam  Presentation General Appearance:  Appropriate for Environment  Eye Contact: Good  Speech: Clear and Coherent  Speech Volume: Increased  Handedness: Right   Mood and Affect  Mood: Euthymic; Angry  Affect: Congruent   Thought Process  Thought Processes: Coherent  Descriptions of Associations:Intact  Orientation:Full (Time, Place and Person)  Thought Content:WDL  Diagnosis of Schizophrenia or Schizoaffective disorder in past: Yes  Duration of Psychotic Symptoms: Greater than six months  Hallucinations:Hallucinations: None  Ideas of Reference:Percusatory; Paranoia  Suicidal Thoughts:Suicidal Thoughts: No  Homicidal Thoughts:Homicidal Thoughts: No   Sensorium  Memory: Immediate Fair  Judgment: Fair  Insight: Fair   Art therapist  Concentration: Fair  Attention Span: Fair  Recall: Fair  Fund of Knowledge: Fair  Language: Fair   Psychomotor Activity  Psychomotor Activity: Psychomotor Activity: Normal   Assets  Assets: Desire for Improvement; Resilience; Social Support   Sleep  Sleep: Sleep: Fair Number of Hours of Sleep: 8   Nutritional Assessment (For OBS and FBC admissions only) Has the patient had a weight loss or gain of 10 pounds or more in the last 3 months?: No Has the patient had a decrease in food intake/or appetite?: No Does the patient have dental problems?: No Does the patient have eating habits or behaviors that may be indicators of an eating disorder including binging or inducing vomiting?: No Has the patient recently lost weight without trying?: 0 Has the patient been eating poorly because of a decreased appetite?:  0 Malnutrition Screening Tool Score: 0    Physical Exam HENT:     Head: Normocephalic.     Nose: Nose normal.  Eyes:     Pupils: Pupils are equal, round, and reactive to light.  Cardiovascular:     Rate and Rhythm:  Normal rate.  Pulmonary:     Effort: Pulmonary effort is normal.  Musculoskeletal:        General: Normal range of motion.     Cervical back: Normal range of motion.  Neurological:     General: No focal deficit present.     Mental Status: She is alert.  Psychiatric:        Mood and Affect: Mood normal.        Behavior: Behavior normal.        Thought Content: Thought content normal.        Judgment: Judgment normal.    Review of Systems  Constitutional: Negative.   HENT: Negative.    Eyes: Negative.   Respiratory: Negative.    Cardiovascular: Negative.   Gastrointestinal: Negative.   Genitourinary: Negative.   Musculoskeletal: Negative.   Skin: Negative.   Neurological: Negative.   Psychiatric/Behavioral:  Positive for depression and hallucinations. The patient is nervous/anxious.     Blood pressure 120/87, pulse 82, temperature 98.2 F (36.8 C), temperature source Oral, resp. rate 19, SpO2 100%. There is no height or weight on file to calculate BMI.  Past Psychiatric History: Borderline personality disorder, GAD, MDD, SI  Is the patient at risk to self? No  Has the patient been a risk to self in the past 6 months? No .    Has the patient been a risk to self within the distant past? Yes   Is the patient a risk to others? No   Has the patient been a risk to others in the past 6 months? No   Has the patient been a risk to others within the distant past? No   Past Medical History: See chart  Family History: unknown   Social History: tobacco   Last Labs:  Admission on 06/10/2023, Discharged on 06/10/2023  Component Date Value Ref Range Status   Lipase 06/10/2023 88 (H)  11 - 51 U/L Final   Performed at [facility], [address], [place], [place] [address]   Sodium 06/10/2023 137  135 - 145 mmol/L Final   Potassium 06/10/2023 3.8  3.5 - 5.1 mmol/L Final   Chloride 06/10/2023 105  98 - 111 mmol/L Final   CO2 06/10/2023 26  22 - 32 mmol/L Final    Glucose, Bld 06/10/2023 205 (H)  70 - 99 mg/dL Final   Glucose reference range applies only to samples taken after fasting for at least 8 hours.   BUN 06/10/2023 10  6 - 20 mg/dL Final   Creatinine, Ser 06/10/2023 0.89  0.44 - 1.00 mg/dL Final   Calcium 60/45/4098 9.0  8.9 - 10.3 mg/dL Final   Total Protein 11/91/4782 7.2  6.5 - 8.1 g/dL Final   Albumin 95/62/1308 4.4  3.5 - 5.0 g/dL Final   AST 65/78/4696 21  15 - 41 U/L Final   ALT 06/10/2023 20  0 - 44 U/L Final   Alkaline Phosphatase 06/10/2023 47  38 - 126 U/L Final   Total Bilirubin 06/10/2023 0.3  0.0 - 1.2 mg/dL Final   GFR, Estimated 06/10/2023 >60  >60 mL/min Final   Comment: (NOTE) Calculated using the CKD-EPI Creatinine Equation (2021)  Anion gap 06/10/2023 6  5 - 15 Final   Performed at [facility], [address], [place], [place] [address]   WBC 06/10/2023 10.1  4.0 - 10.5 K/uL Final   RBC 06/10/2023 4.55  3.87 - 5.11 MIL/uL Final   Hemoglobin 06/10/2023 11.4 (L)  12.0 - 15.0 g/dL Final   HCT 82/95/6213 35.9 (L)  36.0 - 46.0 % Final   MCV 06/10/2023 78.9 (L)  80.0 - 100.0 fL Final   MCH 06/10/2023 25.1 (L)  26.0 - 34.0 pg Final   MCHC 06/10/2023 31.8  30.0 - 36.0 g/dL Final   RDW 08/65/7846 15.8 (H)  11.5 - 15.5 % Final   Platelets 06/10/2023 243  150 - 400 K/uL Final   Comment: SPECIMEN CHECKED FOR CLOTS REPEATED TO VERIFY    nRBC 06/10/2023 0.0  0.0 - 0.2 % Final   Performed at [facility], [address] [place], [place] [address]   Color, Urine 06/10/2023 COLORLESS (A)  YELLOW Final   APPearance 06/10/2023 CLEAR  CLEAR Final   Specific Gravity, Urine 06/10/2023 1.013  1.005 - 1.030 Final   pH 06/10/2023 5.5  5.0 - 8.0 Final   Glucose, UA 06/10/2023 NEGATIVE  NEGATIVE mg/dL Final   Hgb urine dipstick 06/10/2023 SMALL (A)  NEGATIVE Final   Bilirubin Urine 06/10/2023 NEGATIVE  NEGATIVE Final   Ketones, ur 06/10/2023 NEGATIVE  NEGATIVE mg/dL Final   Protein, ur  28/41/3244 NEGATIVE  NEGATIVE mg/dL Final   Nitrite 04/29/7251 NEGATIVE  NEGATIVE Final   Leukocytes,Ua 06/10/2023 NEGATIVE  NEGATIVE Final   RBC / HPF 06/10/2023 0-5  0 - 5 RBC/hpf Final   WBC, UA 06/10/2023 0-5  0 - 5 WBC/hpf Final   Bacteria, UA 06/10/2023 NONE SEEN  NONE SEEN Final   Squamous Epithelial / HPF 06/10/2023 0-5  0 - 5 /HPF Final   Mucus 06/10/2023 PRESENT   Final   Performed at [facility], [address] [place], [place] [address]   Preg Test, Ur 06/10/2023 NEGATIVE  NEGATIVE Final   Comment:        THE SENSITIVITY OF THIS METHODOLOGY IS >25 mIU/mL. Performed at [facility], [address], [place], [place] [address]   Admission on 05/25/2023, Discharged on 05/26/2023  Component Date Value Ref Range Status   Color, Urine 05/25/2023 STRAW (A)  YELLOW Final   APPearance 05/25/2023 CLEAR  CLEAR Final   Specific Gravity, Urine 05/25/2023 1.011  1.005 - 1.030 Final   pH 05/25/2023 6.0  5.0 - 8.0 Final   Glucose, UA 05/25/2023 >=500 (A)  NEGATIVE mg/dL Final   Hgb urine dipstick 05/25/2023 NEGATIVE  NEGATIVE Final   Bilirubin Urine 05/25/2023 NEGATIVE  NEGATIVE Final   Ketones, ur 05/25/2023 NEGATIVE  NEGATIVE mg/dL Final   Protein, ur 59/56/3875 NEGATIVE  NEGATIVE mg/dL Final   Nitrite 64/33/2951 NEGATIVE  NEGATIVE Final   Leukocytes,Ua 05/25/2023 NEGATIVE  NEGATIVE Final   RBC / HPF 05/25/2023 0-5  0 - 5 RBC/hpf Final   WBC, UA 05/25/2023 0-5  0 - 5 WBC/hpf Final   Bacteria, UA 05/25/2023 RARE (A)  NONE SEEN Final   Squamous Epithelial / HPF 05/25/2023 0-5  0 - 5 /HPF Final   Performed at [facility], [address], [place], [place] [address]  Admission on 04/12/2023, Discharged on 04/13/2023  Component Date Value Ref Range Status   Hgb A1c MFr Bld 04/12/2023 8.5 (H)  4.8 - 5.6 % Final   Comment: (  NOTE) Pre diabetes:          5.7%-6.4%  Diabetes:              >6.4%  Glycemic control for    <7.0% adults with diabetes    Mean Plasma Glucose 04/12/2023 197.25  mg/dL Final   Performed at [facility], [address], [place], [place] [address]   WBC 04/12/2023 9.6  4.0 - 10.5 K/uL Final   RBC 04/12/2023 4.97  3.87 - 5.11 MIL/uL Final   Hemoglobin 04/12/2023 12.2  12.0 - 15.0 g/dL Final   HCT 60/45/4098 38.7  36.0 - 46.0 % Final   MCV 04/12/2023 77.9 (L)  80.0 - 100.0 fL Final   MCH 04/12/2023 24.5 (L)  26.0 - 34.0 pg Final   MCHC 04/12/2023 31.5  30.0 - 36.0 g/dL Final   RDW 11/91/4782 15.6 (H)  11.5 - 15.5 % Final   Platelets 04/12/2023 239  150 - 400 K/uL Final   REPEATED TO VERIFY   nRBC 04/12/2023 0.0  0.0 - 0.2 % Final   Neutrophils Relative % 04/12/2023 57  % Final   Neutro Abs 04/12/2023 5.5  1.7 - 7.7 K/uL Final   Lymphocytes Relative 04/12/2023 33  % Final   Lymphs Abs 04/12/2023 3.1  0.7 - 4.0 K/uL Final   Monocytes Relative 04/12/2023 6  % Final   Monocytes Absolute 04/12/2023 0.5  0.1 - 1.0 K/uL Final   Eosinophils Relative 04/12/2023 3  % Final   Eosinophils Absolute 04/12/2023 0.3  0.0 - 0.5 K/uL Final   Basophils Relative 04/12/2023 1  % Final   Basophils Absolute 04/12/2023 0.1  0.0 - 0.1 K/uL Final   Immature Granulocytes 04/12/2023 0  % Final   Abs Immature Granulocytes 04/12/2023 0.03  0.00 - 0.07 K/uL Final   Performed at [facility], [address] [place], [place] [address]   Sodium 04/12/2023 139  135 - 145 mmol/L Final   Potassium 04/12/2023 3.4 (L)  3.5 - 5.1 mmol/L Final   Chloride 04/12/2023 106  98 - 111 mmol/L Final   CO2 04/12/2023 24  22 - 32 mmol/L Final   Glucose, Bld 04/12/2023 195 (H)  70 - 99 mg/dL Final   Glucose reference range applies only to samples taken after fasting for at least 8 hours.   BUN 04/12/2023 <5 (L)  6 - 20 mg/dL Final   Creatinine, Ser 04/12/2023 0.76  0.44 - 1.00 mg/dL Final   Calcium 30/86/5784 9.1  8.9 - 10.3 mg/dL Final   Total Protein 69/62/9528 7.4  6.5 - 8.1 g/dL Final   Albumin 41/32/4401  4.0  3.5 - 5.0 g/dL Final   AST 02/72/5366 25  15 - 41 U/L Final   ALT 04/12/2023 24  0 - 44 U/L Final   Alkaline Phosphatase 04/12/2023 62  38 - 126 U/L Final   Total Bilirubin 04/12/2023 0.5  <1.2 mg/dL Final   GFR, Estimated 04/12/2023 >60  >60 mL/min Final   Comment: (NOTE) Calculated using the CKD-EPI Creatinine Equation (2021)    Anion gap 04/12/2023 9  5 - 15 Final   Performed at [facility], [address] [place], [place] [address]   Alcohol, Ethyl (B) 04/12/2023 <10  <10 mg/dL Final   Comment: (NOTE) Lowest detectable limit for serum alcohol is 10 mg/dL.  For medical purposes only. Performed at [facility], [address] [place], [place] [address]    Preg Test,  Ur 04/12/2023 Negative  Negative Final   POC Amphetamine UR 04/12/2023 None Detected  NONE DETECTED (Cut Off Level 1000 ng/mL) Final   POC Secobarbital (BAR) 04/12/2023 None Detected  NONE DETECTED (Cut Off Level 300 ng/mL) Final   POC Buprenorphine (BUP) 04/12/2023 None Detected  NONE DETECTED (Cut Off Level 10 ng/mL) Final   POC Oxazepam (BZO) 04/12/2023 None Detected  NONE DETECTED (Cut Off Level 300 ng/mL) Final   POC Cocaine UR 04/12/2023 None Detected  NONE DETECTED (Cut Off Level 300 ng/mL) Final   POC Methamphetamine UR 04/12/2023 None Detected  NONE DETECTED (Cut Off Level 1000 ng/mL) Final   POC Morphine 04/12/2023 None Detected  NONE DETECTED (Cut Off Level 300 ng/mL) Final   POC Methadone UR 04/12/2023 None Detected  NONE DETECTED (Cut Off Level 300 ng/mL) Final   POC Oxycodone UR 04/12/2023 None Detected  NONE DETECTED (Cut Off Level 100 ng/mL) Final   POC Marijuana UR 04/12/2023 Positive (A)  NONE DETECTED (Cut Off Level 50 ng/mL) Final   TSH 04/12/2023 0.971  0.350 - 4.500 uIU/mL Final   Comment: Performed by a 3rd Generation assay with a functional sensitivity of <=0.01 uIU/mL. Performed at [facility], [address], [place], [place] [address]    Lithium Lvl 04/12/2023 0.10  (L)  0.60 - 1.20 mmol/L Final   Performed at [facility], [address], [place], [place] [address]   Preg Test, Ur 04/12/2023 NEGATIVE  NEGATIVE Final   Comment:        THE SENSITIVITY OF THIS METHODOLOGY IS >24 mIU/mL    Glucose-Capillary 04/12/2023 232 (H)  70 - 99 mg/dL Final   Glucose reference range applies only to samples taken after fasting for at least 8 hours.   Glucose-Capillary 04/12/2023 186 (H)  70 - 99 mg/dL Final   Glucose reference range applies only to samples taken after fasting for at least 8 hours.   Glucose-Capillary 04/13/2023 203 (H)  70 - 99 mg/dL Final   Glucose reference range applies only to samples taken after fasting for at least 8 hours.   Glucose-Capillary 04/13/2023 279 (H)  70 - 99 mg/dL Final   Glucose reference range applies only to samples taken after fasting for at least 8 hours.  Admission on 03/03/2023, Discharged on 03/03/2023  Component Date Value Ref Range Status   Glucose-Capillary 03/03/2023 321 (H)  70 - 99 mg/dL Final   Glucose reference range applies only to samples taken after fasting for at least 8 hours.   WBC 03/03/2023 9.7  4.0 - 10.5 K/uL Final   RBC 03/03/2023 4.64  3.87 - 5.11 MIL/uL Final   Hemoglobin 03/03/2023 11.6 (L)  12.0 - 15.0 g/dL Final   HCT 09/81/1914 36.2  36.0 - 46.0 % Final   MCV 03/03/2023 78.0 (L)  80.0 - 100.0 fL Final   MCH 03/03/2023 25.0 (L)  26.0 - 34.0 pg Final   MCHC 03/03/2023 32.0  30.0 - 36.0 g/dL Final   RDW 78/29/5621 15.3  11.5 - 15.5 % Final   Platelets 03/03/2023 280  150 - 400 K/uL Final   nRBC 03/03/2023 0.0  0.0 - 0.2 % Final   Performed at [facility], [address], [place], [place] [address]   Preg, Serum 03/03/2023 NEGATIVE  NEGATIVE Final   Comment:        THE SENSITIVITY OF THIS METHODOLOGY IS >10 mIU/mL. Performed at [facility], [address] [place], [place]  [address]    Sodium 03/03/2023 137  135 - 145 mmol/L Final   Potassium  03/03/2023 4.2  3.5 - 5.1 mmol/L Final   Chloride 03/03/2023 104  98 - 111 mmol/L Final   CO2 03/03/2023 25  22 - 32 mmol/L Final   Glucose, Bld 03/03/2023 369 (H)  70 - 99 mg/dL Final   Glucose reference range applies only to samples taken after fasting for at least 8 hours.   BUN 03/03/2023 8  6 - 20 mg/dL Final   Creatinine, Ser 03/03/2023 0.67  0.44 - 1.00 mg/dL Final   Calcium 60/45/4098 9.2  8.9 - 10.3 mg/dL Final   Total Protein 11/91/4782 7.8  6.5 - 8.1 g/dL Final   Albumin 95/62/1308 4.1  3.5 - 5.0 g/dL Final   AST 65/78/4696 26  15 - 41 U/L Final   ALT 03/03/2023 25  0 - 44 U/L Final   Alkaline Phosphatase 03/03/2023 77  38 - 126 U/L Final   Total Bilirubin 03/03/2023 0.6  <1.2 mg/dL Final   GFR, Estimated 03/03/2023 >60  >60 mL/min Final   Comment: (NOTE) Calculated using the CKD-EPI Creatinine Equation (2021)    Anion gap 03/03/2023 8  5 - 15 Final   Performed at [facility], [address] [place], [place] [address]   pH, Ven 03/03/2023 7.38  7.25 - 7.43 Final   pCO2, Ven 03/03/2023 43 (L)  44 - 60 mmHg Final   pO2, Ven 03/03/2023 55 (H)  32 - 45 mmHg Final   Bicarbonate 03/03/2023 25.4  20.0 - 28.0 mmol/L Final   Acid-Base Excess 03/03/2023 0.0  0.0 - 2.0 mmol/L Final   O2 Saturation 03/03/2023 89.5  % Final   Patient temperature 03/03/2023 37.0   Final   Performed at [facility], [address], [place], [place] [address]   Lactic Acid, Venous 03/03/2023 1.3  0.5 - 1.9 mmol/L Final  Admission on 02/22/2023, Discharged on 02/22/2023  Component Date Value Ref Range Status   Glucose-Capillary 02/22/2023 291 (H)  70 - 99 mg/dL Final   Glucose reference range applies only to samples taken after fasting for at least 8 hours.   SARS Coronavirus 2 by RT PCR 02/22/2023 NEGATIVE  NEGATIVE Final   Comment: (NOTE) SARS-CoV-2 target nucleic acids are NOT DETECTED.  The SARS-CoV-2 RNA is generally detectable in upper  respiratory specimens during the acute phase of infection. The lowest concentration of SARS-CoV-2 viral copies this assay can detect is 138 copies/mL. A negative result does not preclude SARS-Cov-2 infection and should not be used as the sole basis for treatment or other patient management decisions. A negative result may occur with  improper specimen collection/handling, submission of specimen other than nasopharyngeal swab, presence of viral mutation(s) within the areas targeted by this assay, and inadequate number of viral copies(<138 copies/mL). A negative result must be combined with clinical observations, patient history, and epidemiological information. The expected result is Negative.  Fact Sheet for Patients:  BloggerCourse.com  Fact Sheet for Healthcare Providers:  SeriousBroker.it  This test is no                          t yet approved or cleared by the [place] FDA and  has been authorized for detection and/or diagnosis of SARS-CoV-2 by FDA under an Emergency Use Authorization (EUA). This EUA will remain  in effect (meaning this test can be used) for the duration of  the COVID-19 declaration under Section 564(b)(1) of the Act, 21 U.S.C.section 360bbb-3(b)(1), unless the authorization is terminated  or revoked sooner.       Influenza A by PCR 02/22/2023 NEGATIVE  NEGATIVE Final   Influenza B by PCR 02/22/2023 NEGATIVE  NEGATIVE Final   Comment: (NOTE) The Xpert Xpress SARS-CoV-2/FLU/RSV plus assay is intended as an aid in the diagnosis of influenza from Nasopharyngeal swab specimens and should not be used as a sole basis for treatment. Nasal washings and aspirates are unacceptable for Xpert Xpress SARS-CoV-2/FLU/RSV testing.  Fact Sheet for Patients: BloggerCourse.com  Fact Sheet for Healthcare Providers: SeriousBroker.it  This test is not yet approved or  cleared by the [place] FDA and has been authorized for detection and/or diagnosis of SARS-CoV-2 by FDA under an Emergency Use Authorization (EUA). This EUA will remain in effect (meaning this test can be used) for the duration of the COVID-19 declaration under Section 564(b)(1) of the Act, 21 U.S.C. section 360bbb-3(b)(1), unless the authorization is terminated or revoked.     Resp Syncytial Virus by PCR 02/22/2023 NEGATIVE  NEGATIVE Final   Comment: (NOTE) Fact Sheet for Patients: BloggerCourse.com  Fact Sheet for Healthcare Providers: SeriousBroker.it  This test is not yet approved or cleared by the [place] FDA and has been authorized for detection and/or diagnosis of SARS-CoV-2 by FDA under an Emergency Use Authorization (EUA). This EUA will remain in effect (meaning this test can be used) for the duration of the COVID-19 declaration under Section 564(b)(1) of the Act, 21 U.S.C. section 360bbb-3(b)(1), unless the authorization is terminated or revoked.  Performed at [facility], [address] [place], [place] [address]   Admission on 02/07/2023, Discharged on 02/07/2023  Component Date Value Ref Range Status   Color, Urine 02/07/2023 STRAW (A)  YELLOW Final   APPearance 02/07/2023 CLEAR  CLEAR Final   Specific Gravity, Urine 02/07/2023 1.028  1.005 - 1.030 Final   pH 02/07/2023 5.0  5.0 - 8.0 Final   Glucose, UA 02/07/2023 >=500 (A)  NEGATIVE mg/dL Final   Hgb urine dipstick 02/07/2023 NEGATIVE  NEGATIVE Final   Bilirubin Urine 02/07/2023 NEGATIVE  NEGATIVE Final   Ketones, ur 02/07/2023 NEGATIVE  NEGATIVE mg/dL Final   Protein, ur 60/45/4098 NEGATIVE  NEGATIVE mg/dL Final   Nitrite 11/91/4782 NEGATIVE  NEGATIVE Final   Leukocytes,Ua 02/07/2023 NEGATIVE  NEGATIVE Final   RBC / HPF 02/07/2023 0-5  0 - 5 RBC/hpf Final   WBC, UA 02/07/2023 0-5  0 - 5 WBC/hpf Final   Bacteria, UA 02/07/2023  NONE SEEN  NONE SEEN Final   Squamous Epithelial / HPF 02/07/2023 0-5  0 - 5 /HPF Final   Hyaline Casts, UA 02/07/2023 PRESENT   Final   Performed at [facility], [address], [place], [place] [address]   Preg Test, Ur 02/07/2023 NEGATIVE  NEGATIVE Final   Comment:        THE SENSITIVITY OF THIS METHODOLOGY IS >25 mIU/mL. Performed at [facility], [address], [place], [place] [address]    Yeast Wet Prep HPF POC 02/07/2023 NONE SEEN  NONE SEEN Final   Trich, Wet Prep 02/07/2023 NONE SEEN  NONE SEEN Final   Clue Cells Wet Prep HPF POC 02/07/2023 NONE SEEN  NONE SEEN Final   WBC, Wet Prep HPF POC 02/07/2023 <10  <10 Final   Sperm 02/07/2023 NONE SEEN   Final   Performed at [facility], [address]  [place], [place] [address]   Neisseria Gonorrhea 02/07/2023 Negative   Final   Chlamydia 02/07/2023 Negative   Final   Comment 02/07/2023 Normal Reference Ranger Chlamydia - Negative   Final   Comment 02/07/2023 Normal Reference Range Neisseria Gonorrhea - Negative   Final  Admission on 01/17/2023, Discharged on 01/17/2023  Component Date Value Ref Range Status   Glucose-Capillary 01/17/2023 292 (H)  70 - 99 mg/dL Final   Glucose reference range applies only to samples taken after fasting for at least 8 hours.   Color, Urine 01/17/2023 STRAW (A)  YELLOW Final   APPearance 01/17/2023 HAZY (A)  CLEAR Final   Specific Gravity, Urine 01/17/2023 1.022  1.005 - 1.030 Final   pH 01/17/2023 5.0  5.0 - 8.0 Final   Glucose, UA 01/17/2023 >=500 (A)  NEGATIVE mg/dL Final   Hgb urine dipstick 01/17/2023 SMALL (A)  NEGATIVE Final   Bilirubin Urine 01/17/2023 NEGATIVE  NEGATIVE Final   Ketones, ur 01/17/2023 5 (A)  NEGATIVE mg/dL Final   Protein, ur 60/45/4098 NEGATIVE  NEGATIVE mg/dL Final   Nitrite 11/91/4782 NEGATIVE  NEGATIVE Final   Leukocytes,Ua 01/17/2023 MODERATE (A)  NEGATIVE Final   RBC / HPF 01/17/2023 0-5  0 - 5 RBC/hpf  Final   WBC, UA 01/17/2023 6-10  0 - 5 WBC/hpf Final   Bacteria, UA 01/17/2023 RARE (A)  NONE SEEN Final   Squamous Epithelial / HPF 01/17/2023 0-5  0 - 5 /HPF Final   Mucus 01/17/2023 PRESENT   Final   Performed at [facility], [address] [place], [place] [address]   Yeast Wet Prep HPF POC 01/17/2023 NONE SEEN  NONE SEEN Final   Trich, Wet Prep 01/17/2023 NONE SEEN  NONE SEEN Final   Clue Cells Wet Prep HPF POC 01/17/2023 PRESENT (A)  NONE SEEN Final   WBC, Wet Prep HPF POC 01/17/2023 <10  <10 Final   Sperm 01/17/2023 NONE SEEN   Final   Performed at [facility], [address] [place], [place] [address]   Neisseria Gonorrhea 01/17/2023 Negative   Final   Chlamydia 01/17/2023 Negative   Final   Comment 01/17/2023 Normal Reference Ranger Chlamydia - Negative   Final   Comment 01/17/2023 Normal Reference Range Neisseria Gonorrhea - Negative   Final  Admission on 01/16/2023, Discharged on 01/16/2023  Component Date Value Ref Range Status   Color, Urine 01/16/2023 STRAW (A)  YELLOW Final   APPearance 01/16/2023 CLEAR  CLEAR Final   Specific Gravity, Urine 01/16/2023 1.015  1.005 - 1.030 Final   pH 01/16/2023 5.0  5.0 - 8.0 Final   Glucose, UA 01/16/2023 >=500 (A)  NEGATIVE mg/dL Final   Hgb urine dipstick 01/16/2023 NEGATIVE  NEGATIVE Final   Bilirubin Urine 01/16/2023 NEGATIVE  NEGATIVE Final   Ketones, ur 01/16/2023 NEGATIVE  NEGATIVE mg/dL Final   Protein, ur 78/46/9629 NEGATIVE  NEGATIVE mg/dL Final   Nitrite 52/84/1324 NEGATIVE  NEGATIVE Final   Leukocytes,Ua 01/16/2023 NEGATIVE  NEGATIVE Final   RBC / HPF 01/16/2023 0-5  0 - 5 RBC/hpf Final   WBC, UA 01/16/2023 0-5  0 - 5 WBC/hpf Final   Bacteria, UA 01/16/2023 NONE SEEN  NONE SEEN Final   Squamous Epithelial / HPF 01/16/2023 0-5  0 - 5 /HPF Final   Mucus 01/16/2023 PRESENT   Final   Performed at [facility], [address] [place], [place] [address]   Preg Test,  Ur 01/16/2023 NEGATIVE  NEGATIVE Final  Comment:        THE SENSITIVITY OF THIS METHODOLOGY IS >25 mIU/mL. Performed at [facility], [address], [place], [place] [address]   Admission on 01/08/2023, Discharged on 01/09/2023  Component Date Value Ref Range Status   Sodium 01/08/2023 137  135 - 145 mmol/L Final   Potassium 01/08/2023 3.2 (L)  3.5 - 5.1 mmol/L Final   Chloride 01/08/2023 103  98 - 111 mmol/L Final   CO2 01/08/2023 25  22 - 32 mmol/L Final   Glucose, Bld 01/08/2023 178 (H)  70 - 99 mg/dL Final   Glucose reference range applies only to samples taken after fasting for at least 8 hours.   BUN 01/08/2023 8  6 - 20 mg/dL Final   Creatinine, Ser 01/08/2023 0.71  0.44 - 1.00 mg/dL Final   Calcium 69/62/9528 9.3  8.9 - 10.3 mg/dL Final   Total Protein 41/32/4401 8.0  6.5 - 8.1 g/dL Final   Albumin 02/72/5366 4.4  3.5 - 5.0 g/dL Final   AST 44/06/4740 28  15 - 41 U/L Final   ALT 01/08/2023 28  0 - 44 U/L Final   Alkaline Phosphatase 01/08/2023 64  38 - 126 U/L Final   Total Bilirubin 01/08/2023 0.8  0.3 - 1.2 mg/dL Final   GFR, Estimated 01/08/2023 >60  >60 mL/min Final   Comment: (NOTE) Calculated using the CKD-EPI Creatinine Equation (2021)    Anion gap 01/08/2023 9  5 - 15 Final   Performed at [facility], [address] [place], [place] [address]   Alcohol, Ethyl (B) 01/08/2023 <10  <10 mg/dL Final   Comment: (NOTE) Lowest detectable limit for serum alcohol is 10 mg/dL.  For medical purposes only. Performed at [facility], [address] [place], [place] [address]    Opiates 01/08/2023 NONE DETECTED  NONE DETECTED Final   Cocaine 01/08/2023 NONE DETECTED  NONE DETECTED Final   Benzodiazepines 01/08/2023 NONE DETECTED  NONE DETECTED Final   Amphetamines 01/08/2023 NONE DETECTED  NONE DETECTED Final   Tetrahydrocannabinol 01/08/2023 NONE DETECTED  NONE DETECTED Final   Barbiturates 01/08/2023 NONE  DETECTED  NONE DETECTED Final   Comment: (NOTE) DRUG SCREEN FOR MEDICAL PURPOSES ONLY.  IF CONFIRMATION IS NEEDED FOR ANY PURPOSE, NOTIFY LAB WITHIN 5 DAYS.  LOWEST DETECTABLE LIMITS FOR URINE DRUG SCREEN Drug Class                     Cutoff (ng/mL) Amphetamine and metabolites    1000 Barbiturate and metabolites    200 Benzodiazepine                 200 Opiates and metabolites        300 Cocaine and metabolites        300 THC                            50 Performed at [facility], [address], [place], [place] [address]    WBC 01/08/2023 13.1 (H)  4.0 - 10.5 K/uL Final   RBC 01/08/2023 4.67  3.87 - 5.11 MIL/uL Final   Hemoglobin 01/08/2023 11.2 (L)  12.0 - 15.0 g/dL Final   HCT 18/84/1660 36.9  36.0 - 46.0 % Final   MCV 01/08/2023 79.0 (L)  80.0 - 100.0 fL Final   MCH 01/08/2023 24.0 (L)  26.0 - 34.0 pg Final   MCHC 01/08/2023  30.4  30.0 - 36.0 g/dL Final   RDW 78/29/5621 15.7 (H)  11.5 - 15.5 % Final   Platelets 01/08/2023 338  150 - 400 K/uL Final   Comment: SPECIMEN CHECKED FOR CLOTS REPEATED TO VERIFY    nRBC 01/08/2023 0.0  0.0 - 0.2 % Final   Neutrophils Relative % 01/08/2023 56  % Final   Neutro Abs 01/08/2023 7.3  1.7 - 7.7 K/uL Final   Lymphocytes Relative 01/08/2023 36  % Final   Lymphs Abs 01/08/2023 4.8 (H)  0.7 - 4.0 K/uL Final   Monocytes Relative 01/08/2023 5  % Final   Monocytes Absolute 01/08/2023 0.6  0.1 - 1.0 K/uL Final   Eosinophils Relative 01/08/2023 2  % Final   Eosinophils Absolute 01/08/2023 0.3  0.0 - 0.5 K/uL Final   Basophils Relative 01/08/2023 1  % Final   Basophils Absolute 01/08/2023 0.1  0.0 - 0.1 K/uL Final   Immature Granulocytes 01/08/2023 0  % Final   Abs Immature Granulocytes 01/08/2023 0.05  0.00 - 0.07 K/uL Final   Performed at [facility], [address], [place], [place] [address]   Preg, Serum 01/08/2023 NEGATIVE  NEGATIVE Final   Comment:        THE SENSITIVITY OF THIS METHODOLOGY  IS >10 mIU/mL. Performed at [facility], [address], [place], [place] [address]    Acetaminophen (Tylenol), Serum 01/08/2023 55 (H)  10 - 30 ug/mL Final   Comment: (NOTE) Therapeutic concentrations vary significantly. A range of 10-30 ug/mL  may be an effective concentration for many patients. However, some  are best treated at concentrations outside of this range. Acetaminophen concentrations >150 ug/mL at 4 hours after ingestion  and >50 ug/mL at 12 hours after ingestion are often associated with  toxic reactions.  Performed at [facility], [address] [place], [place] [address]    Salicylate Lvl 01/08/2023 <7.0 (L)  7.0 - 30.0 mg/dL Final   Performed at [facility], [address], [place], [place] [address]   Acetaminophen (Tylenol), Serum 01/09/2023 18  10 - 30 ug/mL Final   Comment: (NOTE) Therapeutic concentrations vary significantly. A range of 10-30 ug/mL  may be an effective concentration for many patients. However, some  are best treated at concentrations outside of this range. Acetaminophen concentrations >150 ug/mL at 4 hours after ingestion  and >50 ug/mL at 12 hours after ingestion are often associated with  toxic reactions.  Performed at [facility], [address] [place], [place] [address]   Admission on 01/06/2023, Discharged on 01/06/2023  Component Date Value Ref Range Status   POC Amphetamine UR 01/06/2023 None Detected  NONE DETECTED (Cut Off Level 1000 ng/mL) Final   POC Secobarbital (BAR) 01/06/2023 None Detected  NONE DETECTED (Cut Off Level 300 ng/mL) Final   POC Buprenorphine (BUP) 01/06/2023 None Detected  NONE DETECTED (Cut Off Level 10 ng/mL) Final   POC Oxazepam (BZO) 01/06/2023 None Detected  NONE DETECTED (Cut Off Level 300 ng/mL) Final   POC Cocaine UR 01/06/2023 None Detected  NONE DETECTED (Cut Off Level 300 ng/mL) Final   POC Methamphetamine UR 01/06/2023  None Detected  NONE DETECTED (Cut Off Level 1000 ng/mL) Final   POC Morphine 01/06/2023 None Detected  NONE DETECTED (Cut Off Level 300 ng/mL) Final   POC Methadone UR 01/06/2023 None Detected  NONE DETECTED (Cut Off Level 300 ng/mL) Final   POC Oxycodone UR 01/06/2023 None Detected  NONE DETECTED (Cut Off Level 100 ng/mL) Final   POC Marijuana UR 01/06/2023 None Detected  NONE DETECTED (Cut Off Level 50 ng/mL) Final   Preg Test, Ur 01/06/2023 Negative  Negative Final   Glucose-Capillary 01/06/2023 179 (H)  70 - 99 mg/dL Final   Glucose reference range applies only to samples taken after fasting for at least 8 hours.  There may be more visits with results that are not included.    Allergies: Wellbutrin [bupropion], Omnipaque [iohexol], Penicillins, Atarax [hydroxyzine], Buspar [buspirone], Contrast media [iodinated contrast media], and Cymbalta [duloxetine hcl]  Medications:  PTA Medications  Medication Sig   TRESIBA FLEXTOUCH 100 UNIT/ML FlexTouch Pen Inject 20 Units into the skin at bedtime.   GLOBAL EASE INJECT PEN NEEDLES 32G X 4 MM MISC Inject 1 each into the skin as directed.   amLODipine (NORVASC) 10 MG tablet Take 1 tablet (10 mg total) by mouth daily.   atorvastatin (LIPITOR) 20 MG tablet Take 1 tablet (20 mg total) by mouth at bedtime.   cloNIDine (CATAPRES) 0.1 MG tablet Take 1 tablet (0.1 mg total) by mouth daily.   metoprolol tartrate (LOPRESSOR) 25 MG tablet Take 1 tablet (25 mg total) by mouth 2 (two) times daily for 14 days.   haloperidol (HALDOL) 10 MG tablet Take 1 tablet (10 mg total) by mouth at bedtime for 14 days.   lithium carbonate (ESKALITH) 450 MG ER tablet Take 1 tablet (450 mg total) by mouth at bedtime. For mood stabilization   traZODone (DESYREL) 150 MG tablet Take 1 tablet (150 mg total) by mouth at bedtime.   venlafaxine XR (EFFEXOR-XR) 150 MG 24 hr capsule Take 1 capsule (150 mg total) by mouth daily with breakfast.   glimepiride (AMARYL) 4 MG tablet Take 1  tablet (4 mg total) by mouth daily.   benztropine (COGENTIN) 0.5 MG tablet Take 1 tablet (0.5 mg total) by mouth 2 (two) times daily. For eps   gabapentin (NEURONTIN) 100 MG capsule Take 1 capsule (100 mg total) by mouth 3 (three) times daily. For agitation   diclofenac Sodium (VOLTAREN) 1 % GEL Apply 4 g topically 4 (four) times daily.      Medical Decision Making  Observation     Recommendations  Based on my evaluation the patient does not appear to have an emergency medical condition.  Sindy Guadeloupe, NP 06/21/23  11:26 PM   Isa Rankin, MD 06/29/23 [phone]

## 2023-06-21 NOTE — BH Assessment (Incomplete)
 [name] is a 33 year old female presenting as a voluntary walk-in, brought in by [employer], due to medication management. Patients mood is elevated. Patient reports she is upset because she was kicked out of the [employer]. Patient states "I am mad cause they kicked me out, I was cursing, yelling, screaming, hollering and chasing someone that took my phone, I was trying to fight them then I fell, I got my phone back, but they kicked me out". Patient states "Barron Alvine and Ethelene Browns put roots, demons and spirits in my apartment and I have to sleep with roots, demons and spirits". Patient is loud, anxious and cooperative.      Borderline intellectual disability, learning disability, OCD,  GAD, MDD.  Suicide attempts by OD x 3 times.  Receives care from [employer] ACT team.  Patient frequents the ER.

## 2023-06-21 NOTE — BH Assessment (Incomplete)
Comprehensive Clinical Assessment (CCA) Note  06/22/2023 [name] [id number]  Disposition: Sindy Guadeloupe, NP, recommends admission for continuous observation for safety and stabilization with psych reassessment in the AM.  The patient demonstrates the following risk factors for suicide: Chronic risk factors for suicide include: {Chronic Risk Factors for Suicide:30414011}. Acute risk factors for suicide include: {Acute Risk Factors for WUJWJXB:14782956}. Protective factors for this patient include: {Protective Factors for Suicide OZHY:86578469}. Considering these factors, the overall suicide risk at this point appears to be {Desc; low/moderate/high:110033}. Patient {ACTION; IS/IS GEX:52841324} appropriate for outpatient follow up.  [name] is a 33 year old female presenting as a voluntary walk-in, brought in by [facility], requesting medication management. Patient has psychiatric history of Borderline intellectual disability, learning disability, OCD, GAD, MDD.  Suicide attempts by OD x 3 times. Patient is currently receiving services from from [facility]. Patient spoke very loud throughout assessment.   Patient reports she is upset because she was kicked out of the [employer]. Patient states "I am mad cause they kicked me out, I was cursing, yelling, screaming, hollering and chasing someone that took my phone, I was trying to fight them, then I fell, I got my phone back, but they kicked me out". Patient states "Barron Alvine and Ethelene Browns put roots, demons and spirits in my apartment and I have to sleep with roots, demons and spirits". Patient is loud, anxious and cooperative.       Patient has psychiatric history of Borderline intellectual disability, learning disability, OCD, GAD, MDD.  Suicide attempts by OD x 3 times.  Receives care from [facility].       Chief Complaint:  Chief Complaint  Patient presents with   Psychiatric Evaluation   Visit  Diagnosis:  Paranoia    CCA Screening, Triage and Referral (STR)  Patient Reported Information How did you hear about [place]? Self  What Is the Reason for Your Visit/Call Today? [name] is a 33 year old female presenting as a voluntary walk-in, brought in by [facility], due to medication management. Patients mood is elevated. Patient reports she is upset because she was kicked out of the [employer]. Patient states "I am mad cause they kicked me out, I was cursing, yelling, screaming, hollering and chasing someone that took my phone, I was trying to fight them then I fell, I got my phone back, but they kicked me out". Patient states "Barron Alvine and Ethelene Browns put roots, demons and spirits in my apartment and I have to sleep with roots, demons and spirits". Patient is loud, anxious and cooperative.  How Long Has This Been Causing You Problems? <Week  What Do You Feel Would Help You the Most Today? Treatment for Depression or other mood problem   Have You Recently Had Any Thoughts About Hurting Yourself? No  Are You Planning to Commit Suicide/Harm Yourself At This time? No   Flowsheet Row ED from 06/21/2023 in [facility] ED from 06/10/2023 in [facility] Emergency Department at [facility] ED from 05/25/2023 in [facility] Emergency Department at [facility]  C-SSRS RISK CATEGORY No Risk No Risk No Risk       Have you Recently Had Thoughts About Hurting Someone [name]? No  Are You Planning to Harm Someone at This Time? No  Explanation: n/a   Have You Used Any Alcohol or Drugs in the Past 24 Hours? No  How Long Ago Did You Use Drugs or Alcohol? No data recorded  What Did You Use and How Much? n/a   Do You Currently Have a Therapist/Psychiatrist? Yes  Name of Therapist/Psychiatrist: Name of Therapist/Psychiatrist: [facility]   Have You Been Recently Discharged From Any Office Practice or Programs? No  Explanation of Discharge From Practice/Program:  NA     CCA Screening Triage Referral Assessment Type of Contact: Face-to-Face  Telemedicine Service Delivery:   Is this Initial or Reassessment?   Date Telepsych consult ordered in [facility]:    Time Telepsych consult ordered in [facility]:    Location of Assessment: [facility] Assessment Services  Provider Location: [facility] Assessment Services   Collateral Involvement: none   Does Patient Have a Automotive engineer Guardian? No  Legal Guardian Contact Information: n/a  Copy of Legal Guardianship Form: -- (n/a)  Legal Guardian Notified of Arrival: -- (n/a)  Legal Guardian Notified of Pending Discharge: -- (n/a)  If Minor and Not Living with Parent(s), Who has Custody? n/a  Is CPS involved or ever been involved? Never  Is APS involved or ever been involved? Never   Patient Determined To Be At Risk for Harm To Self or Others Based on Review of Patient Reported Information or Presenting Complaint? No  Method: No Plan  Availability of Means: No access or NA  Intent: Vague intent or NA  Notification Required: No need or identified person  Additional Information for Danger to Others Potential: -- (n/a)  Additional Comments for Danger to Others Potential: n/a  Are There Guns or Other Weapons in Your Home? No  Types of Guns/Weapons: n/a  Are These Weapons Safely Secured?                            -- (n/a)  Who Could Verify You Are Able To Have These Secured: n/a  Do You Have any Outstanding Charges, Pending Court Dates, Parole/Probation? none  Contacted To Inform of Risk of Harm To Self or Others: Law Enforcement    Does Patient Present under Involuntary Commitment? No    [place] of Residence: [place]   Patient Currently Receiving the Following Services: Not Receiving Services   Determination of Need: Urgent (48 hours)   Options For Referral: [facility]; Medication Management; Outpatient Therapy     CCA Biopsychosocial Patient Reported  Schizophrenia/Schizoaffective Diagnosis in Past: No   Strengths: Self-awareness   Mental Health Symptoms Depression:  Irritability; Fatigue; Difficulty Concentrating; Tearfulness   Duration of Depressive symptoms: Duration of Depressive Symptoms: Less than two weeks   Mania:  Change in energy/activity; Increased Energy; Irritability; Recklessness; Racing thoughts; Overconfidence   Anxiety:   Worrying; Tension; Restlessness; Irritability; Fatigue; Difficulty concentrating   Psychosis:  Delusions   Duration of Psychotic symptoms: Duration of Psychotic Symptoms: Less than six months   Trauma:  None   Obsessions:  None   Compulsions:  None   Inattention:  None   Hyperactivity/Impulsivity:  Feeling of restlessness; Talks excessively   Oppositional/Defiant Behaviors:  N/A   Emotional Irregularity:  N/A   Other Mood/Personality Symptoms:  None    Mental Status Exam Appearance and self-care  Stature:  Average   Weight:  Obese   Clothing:  Casual   Grooming:  Normal   Cosmetic use:  Excessive   Posture/gait:  Tense   Motor activity:  Not Remarkable   Sensorium  Attention:  Distractible   Concentration:  Anxiety interferes; Normal   Orientation:  Object; Person; Place; Time; X5; Situation   Recall/memory:  Normal   Affect and Mood  Affect:  Tearful; Anxious   Mood:  Anxious; Angry; Depressed   Relating  Eye contact:  Normal   Facial expression:  Tense; Anxious; Responsive   Attitude toward examiner:  Cooperative; Dramatic   Thought and Language  Speech flow: Loud; Pressured   Thought content:  Appropriate to Mood and Circumstances   Preoccupation:  None   Hallucinations:  None   Organization:  Coherent   [employer] of Knowledge:  Average   Intelligence:  Below average   Abstraction:  Functional   Judgement:  Fair   Reality Testing:  Distorted   Insight:  Lacking   Decision Making:  Impulsive   Social Functioning   Social Maturity:  Impulsive; Irresponsible   Social Judgement:  Heedless; Victimized   Stress  Stressors:  Family conflict; Relationship   Coping Ability:  Overwhelmed   Skill Deficits:  Intellect/education; Programmer, applications; Interpersonal; Communication; Decision making   Supports:  Friends/Service system     Religion: Religion/Spirituality Are You A Religious Person?: No How Might This Affect Treatment?: N/A  Leisure/Recreation: Leisure / Recreation Do You Have Hobbies?:  Rich Reining)  Exercise/Diet: Exercise/Diet Do You Exercise?:  (uta) Have You Gained or Lost A Significant Amount of Weight in the Past Six Months?: No Do You Follow a Special Diet?: No Do You Have Any Trouble Sleeping?:  (uta)   CCA Employment/Education Employment/Work Situation: Employment / Work Situation Employment Situation: On disability Why is Patient on Disability: n/a How Long has Patient Been on Disability: n/a Patient's Job has Been Impacted by Current Illness: No Has Patient ever Been in the [employer]?: No  Education: Education Is Patient Currently Attending School?: No Last Grade Completed: 11 Did You Attend College?: No Did You Have An Individualized Education Program (IIEP): No Did You Have Any Difficulty At School?: No Patient's Education Has Been Impacted by Current Illness: No   CCA Family/Childhood History Family and Relationship History: Family history Marital status: Single Does patient have children?: Yes How many children?:  (uta) How is patient's relationship with their children?: uta  Childhood History:  Childhood History By whom was/is the patient raised?: Mother Did patient suffer any verbal/emotional/physical/sexual abuse as a child?: No Did patient suffer from severe childhood neglect?: No Has patient ever been sexually abused/assaulted/raped as an adolescent or adult?: No Was the patient ever a victim of a crime or a disaster?: No Witnessed domestic violence?:  No Has patient been affected by domestic violence as an adult?: No       CCA Substance Use Alcohol/Drug Use: Alcohol / Drug Use Pain Medications: See MAR Prescriptions: See MAR Over the Counter: See MAR History of alcohol / drug use?: No history of alcohol / drug abuse Longest period of sobriety (when/how long): N/A Negative Consequences of Use:  (N/A) Withdrawal Symptoms:  (N/A)                         ASAM's:  Six Dimensions of Multidimensional Assessment  Dimension 1:  Acute Intoxication and/or Withdrawal Potential:   Dimension 1:  Description of individual's past and current experiences of substance use and withdrawal: NA  Dimension 2:  Biomedical Conditions and Complications:   Dimension 2:  Description of patient's biomedical conditions and  complications: NA  Dimension 3:  Emotional, Behavioral, or Cognitive Conditions and Complications:  Dimension 3:  Description of emotional, behavioral, or cognitive conditions and complications: NA  Dimension 4:  Readiness to Change:  Dimension 4:  Description of Readiness to Change criteria: NA  Dimension 5:  Relapse, Continued use, or Continued Problem Potential:  Dimension 5:  Relapse, continued use, or continued problem potential critiera description: NA  Dimension 6:  Recovery/Living Environment:  Dimension 6:  Recovery/Iiving environment criteria description: NA  ASAM Severity Score: ASAM's Severity Rating Score: 0  ASAM Recommended Level of Treatment: ASAM Recommended Level of Treatment:  (n/a)   Substance use Disorder (SUD) Substance Use Disorder (SUD)  Checklist Symptoms of Substance Use:  (Pt denies substance use)  Recommendations for Services/Supports/Treatments: Recommendations for Services/Supports/Treatments Recommendations For Services/Supports/Treatments: Medication Management, ACCTT (Assertive Community Treatment), Inpatient Hospitalization, Day Treatment  Disposition Recommendation per psychiatric  provider: {CHLmaccldispo:31820}   DSM5 Diagnoses: Patient Active Problem List   Diagnosis Date Noted   Agitation 09/28/2022   Suicidal ideations 01/04/2022   Suicidal overdose (HCC) 03/23/2021   Syphilis 07/15/2020   Malingering 06/05/2020   Gastroesophageal reflux disease 05/04/2020   Hyperglycemia due to type 2 diabetes mellitus (HCC) 05/04/2020   Long term (current) use of insulin (HCC) 05/04/2020   Migraine without aura 05/04/2020   Morbid obesity (HCC) 05/04/2020   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 2 diabetes mellitus (HCC) 05/04/2020   Prolapsed internal hemorrhoids 05/04/2020   Vitamin D deficiency 05/04/2020   Other symptoms and signs involving cognitive functions and awareness 05/04/2020   Suicide attempt (HCC)    Anxiety state 03/06/2020   Schizophrenia (HCC) 09/13/2019   Bipolar I disorder, most recent episode depressed (HCC) 06/23/2019   MDD (major depressive disorder) 10/10/2018   Schizoaffective disorder, bipolar type (HCC) 09/25/2018   Affective psychosis, bipolar (HCC) 06/13/2018   HTN (hypertension) 05/03/2018   Tobacco use disorder 05/03/2018   Adjustment disorder with emotional disturbance 01/02/2018   Schizophrenia, 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   Moderate bipolar I disorder, most recent episode depressed (HCC)    Schizoaffective disorder (HCC)    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/2017   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   OCD (obsessive compulsive disorder) 10/05/2016   Major depressive disorder, recurrent episode, mild (HCC) 05/04/2016   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   Learning disability 07/18/2015   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/2015   Diabetes mellitus (HCC) 07/18/2015   MDD (major depressive disorder), recurrent, severe, 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   Hyperlipidemia 07/18/2015   Severe episode of recurrent major depressive disorder, without psychotic features (HCC)    Suicidal ideation    OD  (overdose of drug), intentional self-harm, initial encounter (HCC)    Cognitive deficits 10/12/2012   Generalized anxiety disorder 06/28/2012     Referrals to Alternative Service(s): Referred to Alternative Service(s):   Place:   Date:   Time:    Referred to Alternative Service(s):   Place:   Date:   Time:    Referred to Alternative Service(s):   Place:   Date:   Time:    Referred to Alternative Service(s):   Place:   Date:   Time:     Burnetta Sabin, [facility]

## 2023-06-22 DIAGNOSIS — F22 Delusional disorders: Secondary | ICD-10-CM | POA: Diagnosis not present

## 2023-06-22 NOTE — ED Notes (Signed)
 Pt is currently sleeping, no distress noted, environmental check complete, will continue to monitor patient for safety.

## 2023-06-22 NOTE — ED Notes (Signed)
 AMA form explained to patient. Patient verbalized understanding. Patient signed AMA form.

## 2023-06-22 NOTE — ED Notes (Signed)
 Patient A&Ox4. Patient denies SI/HI and AVH. Patient is pleasant at this time. Patient oriented to unit and meal given.  Denies intent to harm self/others when asked. Denies A/VH. Patient denies any physical complaints when asked. No acute distress noted. Support and encouragement provided. Routine safety checks conducted according to facility protocol. Encouraged patient to notify staff if thoughts of harm toward self or others arise. Patient verbalize understanding and agreement. Will continue to monitor for safety.

## 2023-06-22 NOTE — ED Notes (Signed)
 Patient A&O x 4, ambulatory. Patient discharged in no acute distress. Patient denied SI/HI, A/VH upon discharge. Patient verbalized understanding of all discharge instructions reviewed on AVS via staff, to include follow up appointments, and safety.   Pt belongings returned to patient from locker # 31 intact. Patient escorted to lobby via staff for transport to destination. Safety maintained. Bus pass given.

## 2023-06-23 ENCOUNTER — Emergency Department (HOSPITAL_COMMUNITY)
Admission: EM | Admit: 2023-06-23 | Discharge: 2023-06-24 | Discharge status: Against Medical Advice | Payer: 59 | Attending: Emergency Medicine | Admitting: Emergency Medicine

## 2023-06-23 ENCOUNTER — Other Ambulatory Visit: Payer: Self-pay

## 2023-06-23 ENCOUNTER — Encounter (HOSPITAL_COMMUNITY): Payer: Self-pay

## 2023-06-23 DIAGNOSIS — Z5321 Procedure and treatment not carried out due to patient leaving prior to being seen by health care provider: Secondary | ICD-10-CM | POA: Diagnosis not present

## 2023-06-23 DIAGNOSIS — R1110 Vomiting, unspecified: Secondary | ICD-10-CM | POA: Diagnosis present

## 2023-06-23 NOTE — ED Triage Notes (Signed)
 Vomiting that started 10 minutes PTA.

## 2023-06-24 NOTE — ED Notes (Signed)
 Pt. Called to triage x 2 for vital signs.

## 2023-06-26 ENCOUNTER — Emergency Department (HOSPITAL_COMMUNITY)
Admission: EM | Admit: 2023-06-26 | Discharge: 2023-06-26 | Discharge status: Against Medical Advice | Attending: Emergency Medicine | Admitting: Emergency Medicine

## 2023-06-26 DIAGNOSIS — Z5321 Procedure and treatment not carried out due to patient leaving prior to being seen by health care provider: Secondary | ICD-10-CM | POA: Insufficient documentation

## 2023-06-26 DIAGNOSIS — R110 Nausea: Secondary | ICD-10-CM | POA: Insufficient documentation

## 2023-06-26 DIAGNOSIS — R42 Dizziness and giddiness: Secondary | ICD-10-CM | POA: Diagnosis not present

## 2023-06-26 LAB — CBG MONITORING, ED: Glucose-Capillary: 145 mg/dL — ABNORMAL HIGH (ref 70–99)

## 2023-07-05 ENCOUNTER — Encounter (HOSPITAL_COMMUNITY): Payer: Self-pay

## 2023-07-05 ENCOUNTER — Emergency Department (HOSPITAL_COMMUNITY)
Admission: EM | Admit: 2023-07-05 | Discharge: 2023-07-06 | Discharge status: Against Medical Advice | Attending: Emergency Medicine | Admitting: Emergency Medicine

## 2023-07-05 DIAGNOSIS — M79606 Pain in leg, unspecified: Secondary | ICD-10-CM | POA: Diagnosis present

## 2023-07-05 DIAGNOSIS — Z5321 Procedure and treatment not carried out due to patient leaving prior to being seen by health care provider: Secondary | ICD-10-CM | POA: Insufficient documentation

## 2023-07-05 NOTE — ED Triage Notes (Signed)
 Pt comes via [facility] for leg pain that has been going on for 5 months.

## 2023-07-06 NOTE — ED Notes (Signed)
 Pt nax1 for pt vitals in lobby.

## 2023-07-06 NOTE — ED Notes (Signed)
 Pt nax2 for pt vitals in lobby.

## 2023-07-06 NOTE — ED Notes (Signed)
 Pt not in ED lobby, RN made aware.

## 2023-07-13 ENCOUNTER — Encounter (HOSPITAL_COMMUNITY): Payer: Self-pay

## 2023-07-13 ENCOUNTER — Emergency Department (HOSPITAL_COMMUNITY)
Admission: EM | Admit: 2023-07-13 | Discharge: 2023-07-13 | Discharge status: Against Medical Advice | Payer: MEDICAID | Attending: Emergency Medicine | Admitting: Emergency Medicine

## 2023-07-13 DIAGNOSIS — Z5321 Procedure and treatment not carried out due to patient leaving prior to being seen by health care provider: Secondary | ICD-10-CM | POA: Insufficient documentation

## 2023-07-13 DIAGNOSIS — R197 Diarrhea, unspecified: Secondary | ICD-10-CM | POA: Insufficient documentation

## 2023-07-13 DIAGNOSIS — R109 Unspecified abdominal pain: Secondary | ICD-10-CM | POA: Insufficient documentation

## 2023-07-13 LAB — COMPREHENSIVE METABOLIC PANEL
ALT: 20 U/L (ref 0–44)
AST: 24 U/L (ref 15–41)
Albumin: 4 g/dL (ref 3.5–5.0)
Alkaline Phosphatase: 54 U/L (ref 38–126)
Anion gap: 9 (ref 5–15)
BUN: 8 mg/dL (ref 6–20)
CO2: 25 mmol/L (ref 22–32)
Calcium: 9 mg/dL (ref 8.9–10.3)
Chloride: 104 mmol/L (ref 98–111)
Creatinine, Ser: 0.75 mg/dL (ref 0.44–1.00)
GFR, Estimated: >60 mL/min (ref >60)
Glucose, Bld: 247 mg/dL — ABNORMAL HIGH (ref 70–99)
Potassium: 3.8 mmol/L (ref 3.5–5.1)
Sodium: 138 mmol/L (ref 135–145)
Total Bilirubin: 0.4 mg/dL (ref 0.0–1.2)
Total Protein: 7.6 g/dL (ref 6.5–8.1)

## 2023-07-13 LAB — URINALYSIS, ROUTINE W REFLEX MICROSCOPIC
Bacteria, UA: NONE SEEN
Bilirubin Urine: NEGATIVE
Glucose, UA: >=500 mg/dL — AB
Ketones, ur: NEGATIVE mg/dL
Leukocytes,Ua: NEGATIVE
Nitrite: NEGATIVE
Protein, ur: NEGATIVE mg/dL
Specific Gravity, Urine: 1.021 (ref 1.005–1.030)
pH: 5 (ref 5.0–8.0)

## 2023-07-13 LAB — CBC WITH DIFFERENTIAL/PLATELET
Abs Immature Granulocytes: 0.02 10*3/uL (ref 0.00–0.07)
Basophils Absolute: 0.1 10*3/uL (ref 0.0–0.1)
Basophils Relative: 1 %
Eosinophils Absolute: 0.3 10*3/uL (ref 0.0–0.5)
Eosinophils Relative: 4 %
HCT: 37.3 % (ref 36.0–46.0)
Hemoglobin: 11.2 g/dL — ABNORMAL LOW (ref 12.0–15.0)
Immature Granulocytes: 0 %
Lymphocytes Relative: 29 %
Lymphs Abs: 2.3 10*3/uL (ref 0.7–4.0)
MCH: 24.7 pg — ABNORMAL LOW (ref 26.0–34.0)
MCHC: 30 g/dL (ref 30.0–36.0)
MCV: 82.2 fL (ref 80.0–100.0)
Monocytes Absolute: 0.5 10*3/uL (ref 0.1–1.0)
Monocytes Relative: 6 %
Neutro Abs: 5 10*3/uL (ref 1.7–7.7)
Neutrophils Relative %: 60 %
Platelets: 247 10*3/uL (ref 150–400)
RBC: 4.54 MIL/uL (ref 3.87–5.11)
RDW: 14.6 % (ref 11.5–15.5)
WBC: 8.1 10*3/uL (ref 4.0–10.5)
nRBC: 0 % (ref 0.0–0.2)

## 2023-07-13 LAB — HCG, SERUM, QUALITATIVE: Preg, Serum: NEGATIVE

## 2023-07-13 LAB — LIPASE, BLOOD: Lipase: 150 U/L — ABNORMAL HIGH (ref 11–51)

## 2023-07-13 NOTE — ED Notes (Signed)
 Pt informed staff they didn't want to wait around for the results and left

## 2023-07-13 NOTE — ED Triage Notes (Signed)
 Pt presents with c/o abdominal pain. Pt reports her stomach is "getting bigger".

## 2023-08-14 ENCOUNTER — Emergency Department (HOSPITAL_COMMUNITY)
Admission: EM | Admit: 2023-08-14 | Discharge: 2023-08-15 | Disposition: A | Discharge status: Home / Self Care | Payer: MEDICAID | Attending: Emergency Medicine | Admitting: Emergency Medicine

## 2023-08-14 ENCOUNTER — Other Ambulatory Visit: Payer: Self-pay

## 2023-08-14 DIAGNOSIS — R451 Restlessness and agitation: Secondary | ICD-10-CM | POA: Diagnosis present

## 2023-08-14 DIAGNOSIS — F12921 Cannabis use, unspecified with intoxication delirium: Secondary | ICD-10-CM | POA: Insufficient documentation

## 2023-08-14 MED ORDER — LORAZEPAM 1 MG PO TABS
2.0000 mg | ORAL_TABLET | Freq: Once | ORAL | Status: AC
  Administered 2023-08-14: 2 mg via ORAL
  Filled 2023-08-14: qty 2

## 2023-08-14 NOTE — ED Provider Notes (Signed)
 [place] EMERGENCY DEPARTMENT AT [facility] Provider Note   CSN: [id number] Arrival date & time: 08/14/23  2255     History {Add pertinent medical, surgical, social history, OB history to HPI:1} Chief Complaint  Patient presents with   Hallucinations    [name] is a 33 y.o. female.  Presents to the emergency department for the evaluation of agitation and anxiety.  Reports that she smoked marijuana tonight and thinks there may have been something else in it.  Reports that she started to hallucinate after the anxiety.  At arrival she is somewhat anxious but denies homicidality and suicidality.       Home Medications Prior to Admission medications   Medication Sig Start Date End Date Taking? Authorizing Provider  amLODipine  (NORVASC ) 10 MG tablet Take 1 tablet (10 mg total) by mouth daily. 04/13/23   Gwyndolyn Lerner, MD  atorvastatin  (LIPITOR) 20 MG tablet Take 1 tablet (20 mg total) by mouth at bedtime. 04/13/23   Gwyndolyn Lerner, MD  benztropine  (COGENTIN ) 0.5 MG tablet Take 1 tablet (0.5 mg total) by mouth 2 (two) times daily. For eps 04/13/23   Gwyndolyn Lerner, MD  cloNIDine  (CATAPRES ) 0.1 MG tablet Take 1 tablet (0.1 mg total) by mouth daily. 04/13/23   Gwyndolyn Lerner, MD  diclofenac  Sodium (VOLTAREN ) 1 % GEL Apply 4 g topically 4 (four) times daily. 04/13/23   Gwyndolyn Lerner, MD  gabapentin  (NEURONTIN ) 100 MG capsule Take 1 capsule (100 mg total) by mouth 3 (three) times daily. For agitation 04/13/23   Gwyndolyn Lerner, MD  glimepiride  (AMARYL ) 4 MG tablet Take 1 tablet (4 mg total) by mouth daily. 04/13/23   Gwyndolyn Lerner, MD  GLOBAL EASE INJECT PEN NEEDLES 32G X 4 MM MISC Inject 1 each into the skin as directed. 10/20/22   [provider]  haloperidol  (HALDOL ) 10 MG tablet Take 1 tablet (10 mg total) by mouth at bedtime for 14 days. 04/13/23 04/27/23  Gwyndolyn Lerner, MD  lithium  carbonate (ESKALITH ) 450 MG ER tablet  Take 1 tablet (450 mg total) by mouth at bedtime. For mood stabilization 04/13/23   Gwyndolyn Lerner, MD  metoprolol  tartrate (LOPRESSOR ) 25 MG tablet Take 1 tablet (25 mg total) by mouth 2 (two) times daily for 14 days. 04/13/23 04/27/23  Gwyndolyn Lerner, MD  traZODone  (DESYREL ) 150 MG tablet Take 1 tablet (150 mg total) by mouth at bedtime. 04/13/23   Gwyndolyn Lerner, MD  TRESIBA FLEXTOUCH 100 UNIT/ML FlexTouch Pen Inject 20 Units into the skin at bedtime. 03/04/23   [provider]  venlafaxine  XR (EFFEXOR -XR) 150 MG 24 hr capsule Take 1 capsule (150 mg total) by mouth daily with breakfast. 04/13/23   Gwyndolyn Lerner, MD      Allergies    Wellbutrin  [bupropion ], Omnipaque  [iohexol ], Penicillins, Atarax  [hydroxyzine ], Buspar  [buspirone ], Contrast media [iodinated contrast media], and Cymbalta  [duloxetine  hcl]    Review of Systems   Review of Systems  Physical Exam Updated Vital Signs BP (!) 133/93 (BP Location: Right Arm)   Pulse 93   Temp 98.6 F (37 C) (Oral)   Resp 20   Ht 5\' 7"  (1.702 m)   Wt 121 kg   SpO2 98%   BMI 41.78 kg/m  Physical Exam Vitals and nursing note reviewed.  Constitutional:      General: She is not in acute distress.    Appearance: She is well-developed.  HENT:     Head: Normocephalic and atraumatic.     Mouth/Throat:  Mouth: Mucous membranes are moist.  Eyes:     General: Vision grossly intact. Gaze aligned appropriately.     Extraocular Movements: Extraocular movements intact.     Conjunctiva/sclera: Conjunctivae normal.  Cardiovascular:     Rate and Rhythm: Normal rate and regular rhythm.     Pulses: Normal pulses.     Heart sounds: Normal heart sounds, S1 normal and S2 normal. No murmur heard.    No friction rub. No gallop.  Pulmonary:     Effort: Pulmonary effort is normal. No respiratory distress.     Breath sounds: Normal breath sounds.  Abdominal:     General: Bowel sounds are normal.     Palpations: Abdomen is  soft.     Tenderness: There is no abdominal tenderness. There is no guarding or rebound.     Hernia: No hernia is present.  Musculoskeletal:        General: No swelling.     Cervical back: Full passive range of motion without pain, normal range of motion and neck supple. No spinous process tenderness or muscular tenderness. Normal range of motion.     Right lower leg: No edema.     Left lower leg: No edema.  Skin:    General: Skin is warm and dry.     Capillary Refill: Capillary refill takes less than 2 seconds.     Findings: No ecchymosis, erythema, rash or wound.  Neurological:     General: No focal deficit present.     Mental Status: She is alert and oriented to person, place, and time.     GCS: GCS eye subscore is 4. GCS verbal subscore is 5. GCS motor subscore is 6.     Cranial Nerves: Cranial nerves 2-12 are intact.     Sensory: Sensation is intact.     Motor: Motor function is intact.     Coordination: Coordination is intact.  Psychiatric:        Attention and Perception: Attention normal.        Mood and Affect: Mood normal.        Speech: Speech normal.        Behavior: Behavior normal.        Thought Content: Thought content is delusional.     ED Results / Procedures / Treatments   Labs (all labs ordered are listed, but only abnormal results are displayed) Labs Reviewed - No data to display  EKG None  Radiology No results found.  Procedures Procedures  {Document cardiac monitor, telemetry assessment procedure when appropriate:1}  Medications Ordered in ED Medications  LORazepam  (ATIVAN ) tablet 2 mg (2 mg Oral Given 08/14/23 2315)    ED Course/ Medical Decision Making/ A&P   {   Click here for ABCD2, HEART and other calculatorsREFRESH Note before signing :1}                              Medical Decision Making Risk Prescription drug management.   ***  {Document critical care time when appropriate:1} {Document review of labs and clinical decision  tools ie heart score, Chads2Vasc2 etc:1}  {Document your independent review of radiology images, and any outside records:1} {Document your discussion with family members, caretakers, and with consultants:1} {Document social determinants of health affecting pt's care:1} {Document your decision making why or why not admission, treatments were needed:1} Final Clinical Impression(s) / ED Diagnoses Final diagnoses:  None    Rx / DC Orders  ED Discharge Orders     None

## 2023-08-14 NOTE — ED Triage Notes (Signed)
 Arrived via EMS from home, her and her boyfriend smoked weed, pt started feeling anxious. Her boyfriend wouldn't leave and she called EMS because she began to hallucinate and stated to EMS she "wanted to kill somebody" 351 cbg 140/ 75HR 98 RA No pains Random pain in chest.

## 2023-08-15 ENCOUNTER — Encounter (HOSPITAL_COMMUNITY): Payer: Self-pay

## 2023-08-24 ENCOUNTER — Other Ambulatory Visit: Payer: Self-pay

## 2023-08-24 ENCOUNTER — Emergency Department (HOSPITAL_COMMUNITY)
Admission: EM | Admit: 2023-08-24 | Discharge: 2023-08-24 | Discharge status: Against Medical Advice | Payer: MEDICAID | Attending: Emergency Medicine | Admitting: Emergency Medicine

## 2023-08-24 DIAGNOSIS — Z5321 Procedure and treatment not carried out due to patient leaving prior to being seen by health care provider: Secondary | ICD-10-CM | POA: Diagnosis not present

## 2023-08-24 DIAGNOSIS — R112 Nausea with vomiting, unspecified: Secondary | ICD-10-CM | POA: Diagnosis present

## 2023-08-24 NOTE — ED Triage Notes (Signed)
 Patient BIB EMS c/o nausea and vomiting x 2 month. Patient report brown color emesis x2 today. Patient denies abdominal pain. Patient denies fever.  BP 160/100 HR 90 RR 20 O2sat 98% on RA CBG 161

## 2023-08-24 NOTE — ED Notes (Signed)
 Patient report she don't want to be here and wants to leave. Patient left before [facility].

## 2023-08-25 ENCOUNTER — Encounter (HOSPITAL_COMMUNITY): Payer: Self-pay | Admitting: Emergency Medicine

## 2023-08-25 ENCOUNTER — Emergency Department (HOSPITAL_COMMUNITY)
Admission: EM | Admit: 2023-08-25 | Discharge: 2023-08-26 | Discharge status: Against Medical Advice | Payer: MEDICAID | Attending: Emergency Medicine | Admitting: Emergency Medicine

## 2023-08-25 ENCOUNTER — Other Ambulatory Visit: Payer: Self-pay

## 2023-08-25 DIAGNOSIS — R1110 Vomiting, unspecified: Secondary | ICD-10-CM | POA: Diagnosis present

## 2023-08-25 DIAGNOSIS — Z5321 Procedure and treatment not carried out due to patient leaving prior to being seen by health care provider: Secondary | ICD-10-CM | POA: Diagnosis not present

## 2023-08-25 NOTE — ED Triage Notes (Signed)
 Patient stated vomiting twice everyday x 1 month. Patient denies abdominal pain.

## 2023-08-26 MED ORDER — ONDANSETRON 4 MG PO TBDP
4.0000 mg | ORAL_TABLET | Freq: Once | ORAL | Status: AC
  Administered 2023-08-26: 4 mg via ORAL
  Filled 2023-08-26: qty 1

## 2023-08-26 NOTE — ED Notes (Signed)
 Pt. Left triage without being seen by provider. Pt. Seen getting in a cab per security. CN made aware.

## 2023-09-10 ENCOUNTER — Encounter (HOSPITAL_COMMUNITY): Payer: Self-pay | Admitting: Emergency Medicine

## 2023-09-10 ENCOUNTER — Emergency Department (HOSPITAL_COMMUNITY)
Admission: EM | Admit: 2023-09-10 | Discharge: 2023-09-10 | Disposition: A | Discharge status: Home / Self Care | Payer: MEDICAID | Attending: Emergency Medicine | Admitting: Emergency Medicine

## 2023-09-10 ENCOUNTER — Other Ambulatory Visit: Payer: Self-pay

## 2023-09-10 DIAGNOSIS — L0291 Cutaneous abscess, unspecified: Secondary | ICD-10-CM

## 2023-09-10 DIAGNOSIS — R112 Nausea with vomiting, unspecified: Secondary | ICD-10-CM | POA: Insufficient documentation

## 2023-09-10 DIAGNOSIS — R197 Diarrhea, unspecified: Secondary | ICD-10-CM | POA: Diagnosis not present

## 2023-09-10 DIAGNOSIS — L02214 Cutaneous abscess of groin: Secondary | ICD-10-CM | POA: Insufficient documentation

## 2023-09-10 MED ORDER — DOXYCYCLINE HYCLATE 100 MG PO CAPS: 100.0000 mg | ORAL_CAPSULE | Freq: Two times a day (BID) | ORAL | 0 refills | Status: DC

## 2023-09-10 MED ORDER — LOPERAMIDE HCL 2 MG PO CAPS: 2.0000 mg | ORAL_CAPSULE | Freq: Four times a day (QID) | ORAL | 0 refills | Status: DC | PRN

## 2023-09-10 MED ORDER — ONDANSETRON 4 MG PO TBDP: ORAL_TABLET | ORAL | 0 refills | Status: DC

## 2023-09-10 NOTE — Discharge Instructions (Signed)
 I think most likely you have a collection of infection underneath the skin.  I am starting you on antibiotics for this.  The other thing that helps is warm compresses I would have you get a washcloth get it wet throughout the microwave to make sure it is nothing to burn you and then hold it on there 10 to 15 minutes at a time at least 4 times a day.  Please return for rapid spreading redness or if you develop a fever.

## 2023-09-10 NOTE — ED Provider Notes (Signed)
 [place] EMERGENCY DEPARTMENT AT [facility] Provider Note   CSN: [id number] Arrival date & time: 09/10/23  0124     History  Chief Complaint  Patient presents with   Abdominal Pain    [name] is a 33 y.o. female.  34 yo F with a chief complaints of nausea vomiting diarrhea and a spot to her left groin.  Nausea and vomiting has been going on for a couple days.  The spot to the groin has been there for a couple weeks.  She thinks it is getting better but would like someone to take a look at it.  No fevers.   Abdominal Pain      Home Medications Prior to Admission medications   Medication Sig Start Date End Date Taking? Authorizing Provider  doxycycline  (VIBRAMYCIN ) 100 MG capsule Take 1 capsule (100 mg total) by mouth 2 (two) times daily. One po bid x 7 days 09/10/23  Yes Albertus Hughs, DO  loperamide  (IMODIUM ) 2 MG capsule Take 1 capsule (2 mg total) by mouth 4 (four) times daily as needed for diarrhea or loose stools. 09/10/23  Yes Albertus Hughs, DO  ondansetron  (ZOFRAN -ODT) 4 MG disintegrating tablet 4mg  ODT q4 hours prn nausea/vomit 09/10/23  Yes Genaro Bekker, DO  amLODipine  (NORVASC ) 10 MG tablet Take 1 tablet (10 mg total) by mouth daily. 04/13/23   Gwyndolyn Lerner, MD  atorvastatin  (LIPITOR) 20 MG tablet Take 1 tablet (20 mg total) by mouth at bedtime. 04/13/23   Gwyndolyn Lerner, MD  benztropine  (COGENTIN ) 0.5 MG tablet Take 1 tablet (0.5 mg total) by mouth 2 (two) times daily. For eps 04/13/23   Gwyndolyn Lerner, MD  cloNIDine  (CATAPRES ) 0.1 MG tablet Take 1 tablet (0.1 mg total) by mouth daily. 04/13/23   Gwyndolyn Lerner, MD  diclofenac  Sodium (VOLTAREN ) 1 % GEL Apply 4 g topically 4 (four) times daily. 04/13/23   Gwyndolyn Lerner, MD  gabapentin  (NEURONTIN ) 100 MG capsule Take 1 capsule (100 mg total) by mouth 3 (three) times daily. For agitation 04/13/23   Gwyndolyn Lerner, MD  glimepiride  (AMARYL ) 4 MG tablet Take 1 tablet (4 mg total) by  mouth daily. 04/13/23   Gwyndolyn Lerner, MD  GLOBAL EASE INJECT PEN NEEDLES 32G X 4 MM MISC Inject 1 each into the skin as directed. 10/20/22   [provider]  haloperidol  (HALDOL ) 10 MG tablet Take 1 tablet (10 mg total) by mouth at bedtime for 14 days. 04/13/23 04/27/23  Gwyndolyn Lerner, MD  lithium  carbonate (ESKALITH ) 450 MG ER tablet Take 1 tablet (450 mg total) by mouth at bedtime. For mood stabilization 04/13/23   Gwyndolyn Lerner, MD  metoprolol  tartrate (LOPRESSOR ) 25 MG tablet Take 1 tablet (25 mg total) by mouth 2 (two) times daily for 14 days. 04/13/23 04/27/23  Gwyndolyn Lerner, MD  traZODone  (DESYREL ) 150 MG tablet Take 1 tablet (150 mg total) by mouth at bedtime. 04/13/23   Gwyndolyn Lerner, MD  TRESIBA FLEXTOUCH 100 UNIT/ML FlexTouch Pen Inject 20 Units into the skin at bedtime. 03/04/23   [provider]  venlafaxine  XR (EFFEXOR -XR) 150 MG 24 hr capsule Take 1 capsule (150 mg total) by mouth daily with breakfast. 04/13/23   Gwyndolyn Lerner, MD      Allergies    Wellbutrin  [bupropion ], Omnipaque  [iohexol ], Penicillins, Atarax  [hydroxyzine ], Buspar  [buspirone ], Contrast media [iodinated contrast media], and Cymbalta  [duloxetine  hcl]    Review of Systems   Review of Systems  Gastrointestinal:  Positive for abdominal pain.    Physical  Exam Updated Vital Signs BP (!) 146/80 (BP Location: Left Arm)   Pulse 84   Temp 98.4 F (36.9 C) (Oral)   Resp 20   SpO2 98%  Physical Exam Vitals and nursing note reviewed.  Constitutional:      General: She is not in acute distress.    Appearance: She is well-developed. She is not diaphoretic.  HENT:     Head: Normocephalic and atraumatic.  Eyes:     Pupils: Pupils are equal, round, and reactive to light.  Cardiovascular:     Rate and Rhythm: Normal rate and regular rhythm.     Heart sounds: No murmur heard.    No friction rub. No gallop.  Pulmonary:     Effort: Pulmonary effort is normal.      Breath sounds: No wheezing or rales.  Abdominal:     General: There is no distension.     Palpations: Abdomen is soft.     Tenderness: There is no abdominal tenderness.  Genitourinary:    Comments: Along the left inguinal crease the patient has a small skin colored nodule.  Nontender no obvious fluctuance no induration  Musculoskeletal:        General: No tenderness.     Cervical back: Normal range of motion and neck supple.  Skin:    General: Skin is warm and dry.  Neurological:     Mental Status: She is alert and oriented to person, place, and time.  Psychiatric:        Behavior: Behavior normal.     ED Results / Procedures / Treatments   Labs (all labs ordered are listed, but only abnormal results are displayed) Labs Reviewed - No data to display  EKG None  Radiology No results found.  Procedures Procedures    Medications Ordered in ED Medications - No data to display  ED Course/ Medical Decision Making/ A&P                                 Medical Decision Making Risk Prescription drug management.   33 yo F with a chief complaints of nausea vomiting and diarrhea.  Going on for a couple days.  She is also concerned about a lesion to her left groin.  Clinically this may be a resolving abscess.  Will start on oral antibiotics.  Warm compresses.  PCP follow-up.  Patient is seen in the ED multiple times in the past.  Has a care plan.  Has benign abdominal exam.  Appears well-hydrated.  Will hold off on blood work or imaging.  Treat with medications for nausea and diarrhea.  PCP follow-up.  6:22 AM:  I have discussed the diagnosis/risks/treatment options with the patient.  Evaluation and diagnostic testing in the emergency department does not suggest an emergent condition requiring admission or immediate intervention beyond what has been performed at this time.  They will follow up with PCP. We also discussed returning to the ED immediately if new or worsening sx  occur. We discussed the sx which are most concerning (e.g., sudden worsening pain, fever, inability to tolerate by mouth) that necessitate immediate return. Medications administered to the patient during their visit and any new prescriptions provided to the patient are listed below.  Medications given during this visit Medications - No data to display   The patient appears reasonably screen and/or stabilized for discharge and I doubt any other medical condition or other [facility] requiring  further screening, evaluation, or treatment in the ED at this time prior to discharge.          Final Clinical Impression(s) / ED Diagnoses Final diagnoses:  Nausea vomiting and diarrhea  Abscess    Rx / DC Orders ED Discharge Orders          Ordered    doxycycline  (VIBRAMYCIN ) 100 MG capsule  2 times daily        09/10/23 0154    ondansetron  (ZOFRAN -ODT) 4 MG disintegrating tablet        09/10/23 0154    loperamide  (IMODIUM ) 2 MG capsule  4 times daily PRN        09/10/23 0154              Awa Bachicha, DO 09/10/23 0622

## 2023-09-10 NOTE — ED Triage Notes (Signed)
 Pt BIBA Per EMS: Pt c/o abd pain & diarrhea. Also c/o boil to vaginal area.  VSS

## 2023-09-19 ENCOUNTER — Encounter (HOSPITAL_COMMUNITY): Payer: Self-pay

## 2023-09-19 ENCOUNTER — Other Ambulatory Visit: Payer: Self-pay

## 2023-09-19 ENCOUNTER — Emergency Department (HOSPITAL_COMMUNITY)
Admission: EM | Admit: 2023-09-19 | Discharge: 2023-09-19 | Disposition: A | Discharge status: Home / Self Care | Payer: MEDICAID | Attending: Emergency Medicine | Admitting: Emergency Medicine

## 2023-09-19 DIAGNOSIS — I10 Essential (primary) hypertension: Secondary | ICD-10-CM | POA: Diagnosis not present

## 2023-09-19 DIAGNOSIS — Z79899 Other long term (current) drug therapy: Secondary | ICD-10-CM | POA: Insufficient documentation

## 2023-09-19 DIAGNOSIS — Z794 Long term (current) use of insulin: Secondary | ICD-10-CM | POA: Insufficient documentation

## 2023-09-19 DIAGNOSIS — E119 Type 2 diabetes mellitus without complications: Secondary | ICD-10-CM | POA: Insufficient documentation

## 2023-09-19 DIAGNOSIS — M25662 Stiffness of left knee, not elsewhere classified: Secondary | ICD-10-CM | POA: Insufficient documentation

## 2023-09-19 DIAGNOSIS — M25562 Pain in left knee: Secondary | ICD-10-CM | POA: Diagnosis present

## 2023-09-19 NOTE — Discharge Instructions (Signed)
 Use Ace wrap as needed for comfort.  Take ibuprofen  and/or Tylenol  for worsened pain.  Rest and elevate your knee when at home.  Follow-up with your primary care doctor.

## 2023-09-19 NOTE — ED Triage Notes (Signed)
 Pt arrives via POV. Pt reports she has been experiencing discomfort and some swelling in her left knee for the past three weeks. PT ambulatory to triage. NAD. Pt AxOx4.

## 2023-09-19 NOTE — ED Provider Notes (Signed)
 [place] EMERGENCY DEPARTMENT AT [facility] Provider Note   CSN: [id number] Arrival date & time: 09/19/23  4098     History  No chief complaint on file.   [name] is a 33 y.o. female.  HPI Patient presents for left knee pain and stiffness.  Medical history includes DM, cognitive deficits, depression, anxiety, HLD, schizophrenia, GERD, migraines, neuropathy, HTN.  For the past several weeks, patient has had intermittent stiffness and mild aching in her left knee.  She feels that it is mildly swollen.  Symptoms do worsen with prolonged standing or ambulation.  She denies any other associated symptoms.    Home Medications Prior to Admission medications   Medication Sig Start Date End Date Taking? Authorizing Provider  amLODipine  (NORVASC ) 10 MG tablet Take 1 tablet (10 mg total) by mouth daily. 04/13/23   Gwyndolyn Lerner, MD  atorvastatin  (LIPITOR) 20 MG tablet Take 1 tablet (20 mg total) by mouth at bedtime. 04/13/23   Gwyndolyn Lerner, MD  benztropine  (COGENTIN ) 0.5 MG tablet Take 1 tablet (0.5 mg total) by mouth 2 (two) times daily. For eps 04/13/23   Gwyndolyn Lerner, MD  cloNIDine  (CATAPRES ) 0.1 MG tablet Take 1 tablet (0.1 mg total) by mouth daily. 04/13/23   Gwyndolyn Lerner, MD  diclofenac  Sodium (VOLTAREN ) 1 % GEL Apply 4 g topically 4 (four) times daily. 04/13/23   Gwyndolyn Lerner, MD  doxycycline  (VIBRAMYCIN ) 100 MG capsule Take 1 capsule (100 mg total) by mouth 2 (two) times daily. One po bid x 7 days 09/10/23   Albertus Hughs, DO  gabapentin  (NEURONTIN ) 100 MG capsule Take 1 capsule (100 mg total) by mouth 3 (three) times daily. For agitation 04/13/23   Gwyndolyn Lerner, MD  glimepiride  (AMARYL ) 4 MG tablet Take 1 tablet (4 mg total) by mouth daily. 04/13/23   Gwyndolyn Lerner, MD  GLOBAL EASE INJECT PEN NEEDLES 32G X 4 MM MISC Inject 1 each into the skin as directed. 10/20/22   [provider]  haloperidol  (HALDOL ) 10 MG tablet Take  1 tablet (10 mg total) by mouth at bedtime for 14 days. 04/13/23 04/27/23  Gwyndolyn Lerner, MD  lithium  carbonate (ESKALITH ) 450 MG ER tablet Take 1 tablet (450 mg total) by mouth at bedtime. For mood stabilization 04/13/23   Gwyndolyn Lerner, MD  loperamide  (IMODIUM ) 2 MG capsule Take 1 capsule (2 mg total) by mouth 4 (four) times daily as needed for diarrhea or loose stools. 09/10/23   Albertus Hughs, DO  metoprolol  tartrate (LOPRESSOR ) 25 MG tablet Take 1 tablet (25 mg total) by mouth 2 (two) times daily for 14 days. 04/13/23 04/27/23  Gwyndolyn Lerner, MD  ondansetron  (ZOFRAN -ODT) 4 MG disintegrating tablet 4mg  ODT q4 hours prn nausea/vomit 09/10/23   Floyd, Dan, DO  traZODone  (DESYREL ) 150 MG tablet Take 1 tablet (150 mg total) by mouth at bedtime. 04/13/23   Gwyndolyn Lerner, MD  TRESIBA FLEXTOUCH 100 UNIT/ML FlexTouch Pen Inject 20 Units into the skin at bedtime. 03/04/23   [provider]  venlafaxine  XR (EFFEXOR -XR) 150 MG 24 hr capsule Take 1 capsule (150 mg total) by mouth daily with breakfast. 04/13/23   Gwyndolyn Lerner, MD      Allergies    Wellbutrin  [bupropion ], Omnipaque  [iohexol ], Penicillins, Atarax  [hydroxyzine ], Buspar  [buspirone ], Contrast media [iodinated contrast media], and Cymbalta  [duloxetine  hcl]    Review of Systems   Review of Systems  Musculoskeletal:  Positive for arthralgias and joint swelling.  All other systems reviewed and are negative.   Physical  Exam Updated Vital Signs BP (!) 140/81 (BP Location: Left Arm)   Pulse 86   Temp 97.8 F (36.6 C) (Oral)   Resp 18   SpO2 100%  Physical Exam Vitals and nursing note reviewed.  Constitutional:      General: She is not in acute distress.    Appearance: Normal appearance. She is well-developed. She is not ill-appearing, toxic-appearing or diaphoretic.  HENT:     Head: Normocephalic and atraumatic.     Right Ear: External ear normal.     Left Ear: External ear normal.     Nose: Nose normal.      Mouth/Throat:     Mouth: Mucous membranes are moist.  Eyes:     Extraocular Movements: Extraocular movements intact.     Conjunctiva/sclera: Conjunctivae normal.  Cardiovascular:     Rate and Rhythm: Normal rate and regular rhythm.  Pulmonary:     Effort: Pulmonary effort is normal. No respiratory distress.  Abdominal:     General: There is no distension.     Palpations: Abdomen is soft.  Musculoskeletal:        General: No tenderness or deformity. Normal range of motion.     Cervical back: Normal range of motion and neck supple.  Skin:    General: Skin is warm and dry.     Capillary Refill: Capillary refill takes less than 2 seconds.     Coloration: Skin is not jaundiced or pale.  Neurological:     General: No focal deficit present.     Mental Status: She is alert and oriented to person, place, and time.     Sensory: No sensory deficit.     Motor: No weakness.  Psychiatric:        Mood and Affect: Mood normal.        Behavior: Behavior normal.     ED Results / Procedures / Treatments   Labs (all labs ordered are listed, but only abnormal results are displayed) Labs Reviewed - No data to display  EKG None  Radiology No results found.  Procedures Procedures    Medications Ordered in ED Medications - No data to display  ED Course/ Medical Decision Making/ A&P                                 Medical Decision Making  Patient presenting for concern of intermittent and left knee stiffness and mild discomfort over the past several weeks.  On arrival in the ED, vital signs notable for mild hypertension.  On exam, patient is well-appearing.  On inspection of her left knee, it is difficult to appreciate any significant swelling.  Notably, there is no deformity, warmth, erythema, or tenderness.  She has good active range of motion with good strength.  Patient was provided with Ace wrap to use as needed.  She is stable for discharge.        Final Clinical  Impression(s) / ED Diagnoses Final diagnoses:  Stiffness of left knee    Rx / DC Orders ED Discharge Orders     None         Iva Mariner, MD 09/19/23 [phone]

## 2023-09-19 NOTE — ED Notes (Signed)
 Pt. Left ED w/o discharge papers stated she didn't need them.

## 2023-10-04 ENCOUNTER — Ambulatory Visit (HOSPITAL_COMMUNITY)
Admission: EM | Admit: 2023-10-04 | Discharge: 2023-10-05 | Disposition: A | Discharge status: Home / Self Care | Payer: MEDICAID | Attending: Psychiatry | Admitting: Psychiatry

## 2023-10-04 DIAGNOSIS — Z765 Malingerer [conscious simulation]: Secondary | ICD-10-CM | POA: Insufficient documentation

## 2023-10-04 DIAGNOSIS — F419 Anxiety disorder, unspecified: Secondary | ICD-10-CM

## 2023-10-04 MED ORDER — GABAPENTIN 100 MG PO CAPS
100.0000 mg | ORAL_CAPSULE | Freq: Once | ORAL | Status: AC
  Administered 2023-10-04: 100 mg via ORAL
  Filled 2023-10-04: qty 1

## 2023-10-04 NOTE — Progress Notes (Signed)
   10/04/23 2313  [facility] Triage Screening (Walk-ins at [facility] only)  How Did You Hear About Us ? Legal System  What Is the Reason for Your Visit/Call Today? Pt presents to [facility] as a voluntary walk-in, accompanied by [employer] due to [facility]. Pt reports being at home tonight and hearing random singing and voices telling her to take a shower. Pt reports voices are keeping her up at night and she has difficulty sleeping sometimes. Pt also reports her mood being up and down. Pt reports constant arguing with her relative and boyfriend as of lately. Pt currently denies SI,HI,VH and substance/alcohol use.  How Long Has This Been Causing You Problems? <Week  Have You Recently Had Any Thoughts About Hurting Yourself? No  Are You Planning to Commit Suicide/Harm Yourself At This time? No  Have you Recently Had Thoughts About Hurting Someone Marigene Shoulder? No  Are You Planning To Harm Someone At This Time? No  Physical Abuse Denies  Verbal Abuse Denies  Sexual Abuse Denies  Exploitation of patient/patient's resources Denies  Are you currently experiencing any auditory, visual or other hallucinations? Yes  Please explain the hallucinations you are currently experiencing: hearing voices earlier tonight telling her to take a shower and random singing  Have You Used Any Alcohol or Drugs in the Past 24 Hours? No  Do you have any current medical co-morbidities that require immediate attention? No  Clinician description of patient physical appearance/behavior: calm, cooperative  What Do You Feel Would Help You the Most Today? Treatment for Depression or other mood problem  If access to [facility] Urgent Care was not available, would you have sought care in the Emergency Department? Yes  Determination of Need Urgent (48 hours)  Options For Referral Other: Comment;Outpatient Therapy;Medication Management;BH Urgent Care  Determination of Need filed? Yes

## 2023-10-04 NOTE — ED Notes (Signed)
 [employer] called for transport

## 2023-10-04 NOTE — ED Provider Notes (Signed)
 Behavioral Health Urgent Care Medical Screening Exam  Patient Name: [name] MRN: [id number] Date of Evaluation: 10/05/23 Chief Complaint:  voices telling her to take a shower Diagnosis:  Final diagnoses:  Malingering  Anxious mood    History of Present illness: [name] is a 33 y.o. female. With a history of GAD, MDD, IDD.  Presented to [facility] via [employer].  Patient is well-known to the service line.  According to patient she is hearing voices when asked what the voices were saying patient stated they are telling her to take a shower.  When writer asked patient when was the last time she took a shower she said 2 months ago.  Writer encouraged patient to go home and take a shower so she can feel relaxed.  When asked if she had any other complaint the patient stated no.  Patient currently sees an ACT team for medication management.  Patient lives alone in her apartment.  At this present moment patient does not pose a risk to herself or others.  Face-to-face evaluation of patient, patient is alert and oriented x 4, speech is clear, maintain eye contact.  Patient appeared to be pleasant at this moment does not appear to be in any distress does not seem to be a risk to herself or others.  Patient denies SI, HI, or paranoia.  Reports she hear voices telling her to take a shower.  Patient does not appear to be influenced by internal stimuli however I think patient is subconscious of her not having taken a shower in a while.  Patient is encouraged to go home and take a shower so sure she can feel much better.  Patient was in agreement and stated that she just need to get a ride to go back home.  Nursing staff to arrange a ride for patient to go back home patient also asked for a dose of her gabapentin  medication we will give gabapentin  100 mg x 1. At the time of this evaluation patient does not pose a risk to herself or others.  Patient is advised to call 911 or return if she experience suicide  though homicidal ideation.  Patient verbalized understanding.  Recommend discharge for patient to follow up with her ACT team.  Flowsheet Row ED from 10/04/2023 in [facility] ED from 09/19/2023 in [facility] Emergency Department at [facility] ED from 09/10/2023 in [facility] Emergency Department at [facility]  C-SSRS RISK CATEGORY No Risk No Risk No Risk       Psychiatric Specialty Exam  Presentation  General Appearance:Casual  Eye Contact:Good  Speech:Clear and Coherent  Speech Volume:Normal  Handedness:Right   Mood and Affect  Mood: Euthymic  Affect: Congruent   Thought Process  Thought Processes: Coherent  Descriptions of Associations:Intact  Orientation:Full (Time, Place and Person)  Thought Content:WDL  Diagnosis of Schizophrenia or Schizoaffective disorder in past: No  Duration of Psychotic Symptoms: Less than six months  Hallucinations:Auditory voices telling her to take a shower  Ideas of Reference:None  Suicidal Thoughts:No With Intent; With Plan; With Means to Carry Out  Homicidal Thoughts:No   Sensorium  Memory: Immediate Fair  Judgment: Fair  Insight: Fair   Art therapist  Concentration: Fair  Attention Span: Good  Recall: Good  Fund of Knowledge: Good  Language: Good   Psychomotor Activity  Psychomotor Activity: Normal   Assets  Assets: Desire for Improvement; Resilience; Social Support   Sleep  Sleep: Fair  Number  of hours:  8   Physical Exam: Physical Exam HENT:     Head: Normocephalic.     Nose: Nose normal.  Eyes:     Pupils: Pupils are equal, round, and reactive to light.  Cardiovascular:     Rate and Rhythm: Normal rate.  Pulmonary:     Effort: Pulmonary effort is normal.  Musculoskeletal:        General: Normal range of motion.     Cervical back: Normal range of motion.  Neurological:     General: No focal deficit present.      Mental Status: She is alert.  Psychiatric:        Mood and Affect: Mood normal.        Behavior: Behavior normal.    Review of Systems  Constitutional: Negative.   HENT: Negative.    Eyes: Negative.   Respiratory: Negative.    Cardiovascular: Negative.   Gastrointestinal: Negative.   Genitourinary: Negative.   Musculoskeletal: Negative.   Skin: Negative.   Neurological: Negative.   Psychiatric/Behavioral:  Positive for hallucinations.    Blood pressure 139/80, pulse 74, temperature 98.7 F (37.1 C), temperature source Oral, resp. rate 20, SpO2 97%. There is no height or weight on file to calculate BMI.  Musculoskeletal: Strength & Muscle Tone: within normal limits Gait & Station: normal Patient leans: N/A   [facility] MSE Discharge Disposition for Follow up and Recommendations: Based on my evaluation the patient does not appear to have an emergency medical condition and can be discharged with resources and follow up care in outpatient services for Medication Management   Dorthea Gauze, NP 10/05/2023, 4:56 AM

## 2023-10-04 NOTE — ED Notes (Signed)
 Gabapentin  100mg  administered with juice.

## 2023-10-09 ENCOUNTER — Emergency Department (HOSPITAL_COMMUNITY)
Admission: EM | Admit: 2023-10-09 | Discharge: 2023-10-10 | Disposition: A | Discharge status: Home / Self Care | Payer: MEDICAID | Attending: Emergency Medicine | Admitting: Emergency Medicine

## 2023-10-09 ENCOUNTER — Encounter (HOSPITAL_COMMUNITY): Payer: Self-pay

## 2023-10-09 ENCOUNTER — Other Ambulatory Visit: Payer: Self-pay

## 2023-10-09 DIAGNOSIS — M25562 Pain in left knee: Secondary | ICD-10-CM | POA: Diagnosis not present

## 2023-10-09 DIAGNOSIS — Z79899 Other long term (current) drug therapy: Secondary | ICD-10-CM | POA: Insufficient documentation

## 2023-10-09 DIAGNOSIS — K029 Dental caries, unspecified: Secondary | ICD-10-CM | POA: Diagnosis not present

## 2023-10-09 DIAGNOSIS — K0889 Other specified disorders of teeth and supporting structures: Secondary | ICD-10-CM

## 2023-10-09 DIAGNOSIS — Z7984 Long term (current) use of oral hypoglycemic drugs: Secondary | ICD-10-CM | POA: Diagnosis not present

## 2023-10-09 DIAGNOSIS — M25561 Pain in right knee: Secondary | ICD-10-CM | POA: Insufficient documentation

## 2023-10-09 DIAGNOSIS — G8929 Other chronic pain: Secondary | ICD-10-CM | POA: Insufficient documentation

## 2023-10-09 DIAGNOSIS — S0993XA Unspecified injury of face, initial encounter: Secondary | ICD-10-CM

## 2023-10-09 HISTORY — DX: Unspecified osteoarthritis, unspecified site: M19.90

## 2023-10-09 MED ORDER — CLINDAMYCIN HCL 300 MG PO CAPS
300.0000 mg | ORAL_CAPSULE | Freq: Once | ORAL | Status: AC
  Administered 2023-10-10: 300 mg via ORAL
  Filled 2023-10-09: qty 1

## 2023-10-09 MED ORDER — ACETAMINOPHEN 500 MG PO TABS: 1000.0000 mg | ORAL_TABLET | Freq: Once | ORAL | Status: DC

## 2023-10-09 MED ORDER — CLINDAMYCIN HCL 300 MG PO CAPS: 300.0000 mg | ORAL_CAPSULE | Freq: Three times a day (TID) | ORAL | 0 refills | Status: DC

## 2023-10-09 NOTE — ED Provider Notes (Signed)
 [name] EMERGENCY DEPARTMENT AT [facility] Provider Note   CSN: [id number] Arrival date & time: 10/09/23  2333     Patient presents with: Knee Pain and Dental Injury   [name] is a 33 y.o. female.   The history is provided by the patient.  Knee Pain Location:  Knee Injury: no   Knee location:  R knee and L knee Pain details:    Quality:  Aching   Radiates to:  Does not radiate   Onset quality:  Gradual   Timing:  Constant Chronicity:  Chronic Relieved by:  Nothing Worsened by:  Nothing Ineffective treatments:  None tried Associated symptoms: no swelling   Risk factors: no concern for non-accidental trauma   Dental Injury This is a new problem. The current episode started more than 2 days ago. The problem occurs constantly. The problem has not changed since onset.Pertinent negatives include no chest pain, no abdominal pain, no headaches and no shortness of breath. Nothing aggravates the symptoms. She has tried nothing for the symptoms. The treatment provided no relief.       Prior to Admission medications   Medication Sig Start Date End Date Taking? Authorizing Provider  amLODipine  (NORVASC ) 10 MG tablet Take 1 tablet (10 mg total) by mouth daily. 04/13/23   Gwyndolyn Lerner, MD  atorvastatin  (LIPITOR) 20 MG tablet Take 1 tablet (20 mg total) by mouth at bedtime. 04/13/23   Gwyndolyn Lerner, MD  benztropine  (COGENTIN ) 0.5 MG tablet Take 1 tablet (0.5 mg total) by mouth 2 (two) times daily. For eps 04/13/23   Gwyndolyn Lerner, MD  cloNIDine  (CATAPRES ) 0.1 MG tablet Take 1 tablet (0.1 mg total) by mouth daily. 04/13/23   Gwyndolyn Lerner, MD  diclofenac  Sodium (VOLTAREN ) 1 % GEL Apply 4 g topically 4 (four) times daily. 04/13/23   Gwyndolyn Lerner, MD  doxycycline  (VIBRAMYCIN ) 100 MG capsule Take 1 capsule (100 mg total) by mouth 2 (two) times daily. One po bid x 7 days 09/10/23   Floyd, Dan, DO  gabapentin  (NEURONTIN ) 100 MG capsule Take 1  capsule (100 mg total) by mouth 3 (three) times daily. For agitation 04/13/23   Gwyndolyn Lerner, MD  glimepiride  (AMARYL ) 4 MG tablet Take 1 tablet (4 mg total) by mouth daily. 04/13/23   Gwyndolyn Lerner, MD  GLOBAL EASE INJECT PEN NEEDLES 32G X 4 MM MISC Inject 1 each into the skin as directed. 10/20/22   [provider]  haloperidol  (HALDOL ) 10 MG tablet Take 1 tablet (10 mg total) by mouth at bedtime for 14 days. 04/13/23 04/27/23  Gwyndolyn Lerner, MD  lithium  carbonate (ESKALITH ) 450 MG ER tablet Take 1 tablet (450 mg total) by mouth at bedtime. For mood stabilization 04/13/23   Gwyndolyn Lerner, MD  loperamide  (IMODIUM ) 2 MG capsule Take 1 capsule (2 mg total) by mouth 4 (four) times daily as needed for diarrhea or loose stools. 09/10/23   Albertus Hughs, DO  metoprolol  tartrate (LOPRESSOR ) 25 MG tablet Take 1 tablet (25 mg total) by mouth 2 (two) times daily for 14 days. 04/13/23 04/27/23  Gwyndolyn Lerner, MD  ondansetron  (ZOFRAN -ODT) 4 MG disintegrating tablet 4mg  ODT q4 hours prn nausea/vomit 09/10/23   Floyd, Dan, DO  traZODone  (DESYREL ) 150 MG tablet Take 1 tablet (150 mg total) by mouth at bedtime. 04/13/23   Gwyndolyn Lerner, MD  TRESIBA FLEXTOUCH 100 UNIT/ML FlexTouch Pen Inject 20 Units into the skin at bedtime. 03/04/23   [provider]  venlafaxine  XR (EFFEXOR -XR) 150 MG  24 hr capsule Take 1 capsule (150 mg total) by mouth daily with breakfast. 04/13/23   Gwyndolyn Lerner, MD    Allergies: Wellbutrin  [bupropion ], Omnipaque  [iohexol ], Penicillins, Atarax  [hydroxyzine ], Buspar  [buspirone ], Contrast media [iodinated contrast media], and Cymbalta  [duloxetine  hcl]    Review of Systems  HENT:  Positive for dental problem. Negative for facial swelling.   Respiratory:  Negative for shortness of breath.   Cardiovascular:  Negative for chest pain.  Gastrointestinal:  Negative for abdominal pain.  Musculoskeletal:  Positive for arthralgias.  Neurological:   Negative for headaches.    Updated Vital Signs BP (!) 124/90 (BP Location: Right Arm)   Pulse 83   Temp 98.2 F (36.8 C) (Oral)   Resp 19   SpO2 100%   Physical Exam Vitals and nursing note reviewed.  Constitutional:      General: She is not in acute distress.    Appearance: She is well-developed.  HENT:     Head: Normocephalic and atraumatic.     Nose: Nose normal.     Mouth/Throat:     Comments: No thrush multiple dental caries with decay   Eyes:     Pupils: Pupils are equal, round, and reactive to light.    Cardiovascular:     Rate and Rhythm: Normal rate and regular rhythm.  Pulmonary:     Effort: No respiratory distress.     Breath sounds: Normal breath sounds.  Abdominal:     General: Bowel sounds are normal. There is no distension.     Palpations: Abdomen is soft.     Tenderness: There is no abdominal tenderness. There is no guarding or rebound.   Musculoskeletal:        General: Normal range of motion.     Cervical back: Neck supple.   Skin:    General: Skin is warm.     Capillary Refill: Capillary refill takes less than 2 seconds.     Findings: No erythema or rash.   Neurological:     General: No focal deficit present.     Mental Status: She is oriented to person, place, and time.   Psychiatric:        Mood and Affect: Mood normal.     (all labs ordered are listed, but only abnormal results are displayed) Labs Reviewed - No data to display  EKG: None  Radiology: No results found.   Procedures   Medications Ordered in the ED  clindamycin  (CLEOCIN ) capsule 300 mg (has no administration in time range)  acetaminophen  (TYLENOL ) tablet 1,000 mg (has no administration in time range)                                    Medical Decision Making Dental caries and chronic knee pain   Amount and/or Complexity of Data Reviewed External Data Reviewed: notes.    Details: Previous notes reviewed   Risk OTC drugs. Prescription drug  management. Risk Details: Well appearing.  No thrush knee pain is chronic will start clindamycin  for teeth and have patient follow up with dentistry for ongoing care. Stable for discharge.      Final diagnoses:  None   No signs of systemic illness or infection. The patient is nontoxic-appearing on exam and vital signs are within normal limits.  I have reviewed the triage vital signs and the nursing notes. Pertinent labs & imaging results that were available during my care of the  patient were reviewed by me and considered in my medical decision making (see chart for details). After history, exam, and medical workup I feel the patient has been appropriately medically screened and is safe for discharge home. Pertinent diagnoses were discussed with the patient. Patient was given return precautions.      ED Discharge Orders     None          Soua Lenk, MD 10/09/23 2354

## 2023-10-09 NOTE — ED Triage Notes (Signed)
 Pt arrived from home via EMS w/ c/o of bilateral knee pain x months. Hx of arthritis. Pt states she has cavities in each tooth with some thrush on her tongue.

## 2023-10-10 NOTE — ED Notes (Signed)
 Pt refused her paperwork.

## 2023-10-22 ENCOUNTER — Emergency Department (HOSPITAL_COMMUNITY)
Admission: EM | Admit: 2023-10-22 | Discharge: 2023-10-22 | Discharge status: Against Medical Advice | Payer: MEDICAID | Attending: Emergency Medicine | Admitting: Emergency Medicine

## 2023-10-22 ENCOUNTER — Other Ambulatory Visit: Payer: Self-pay

## 2023-10-22 ENCOUNTER — Encounter (HOSPITAL_COMMUNITY): Payer: Self-pay

## 2023-10-22 DIAGNOSIS — N898 Other specified noninflammatory disorders of vagina: Secondary | ICD-10-CM | POA: Diagnosis present

## 2023-10-22 DIAGNOSIS — Z5321 Procedure and treatment not carried out due to patient leaving prior to being seen by health care provider: Secondary | ICD-10-CM | POA: Diagnosis not present

## 2023-10-22 NOTE — ED Triage Notes (Signed)
 Pt came in for vaginal discharge, burning, and itching for two days. Pt described the discharge as cottage cheese.

## 2023-10-24 ENCOUNTER — Encounter (HOSPITAL_BASED_OUTPATIENT_CLINIC_OR_DEPARTMENT_OTHER): Payer: Self-pay

## 2023-10-24 ENCOUNTER — Emergency Department (HOSPITAL_BASED_OUTPATIENT_CLINIC_OR_DEPARTMENT_OTHER)
Admission: EM | Admit: 2023-10-24 | Discharge: 2023-10-24 | Disposition: A | Discharge status: Home / Self Care | Payer: MEDICAID | Attending: Emergency Medicine | Admitting: Emergency Medicine

## 2023-10-24 ENCOUNTER — Other Ambulatory Visit: Payer: Self-pay

## 2023-10-24 DIAGNOSIS — N898 Other specified noninflammatory disorders of vagina: Secondary | ICD-10-CM | POA: Insufficient documentation

## 2023-10-24 DIAGNOSIS — B3731 Acute candidiasis of vulva and vagina: Secondary | ICD-10-CM

## 2023-10-24 LAB — URINALYSIS, ROUTINE W REFLEX MICROSCOPIC
Bilirubin Urine: NEGATIVE
Glucose, UA: >1000 mg/dL — AB
Hgb urine dipstick: NEGATIVE
Ketones, ur: NEGATIVE mg/dL
Nitrite: NEGATIVE
Protein, ur: NEGATIVE mg/dL
Specific Gravity, Urine: 1.027 (ref 1.005–1.030)
WBC, UA: >50 WBC/hpf (ref 0–5)
pH: 5.5 (ref 5.0–8.0)

## 2023-10-24 LAB — WET PREP, GENITAL
Clue Cells Wet Prep HPF POC: NONE SEEN
Sperm: NONE SEEN
Trich, Wet Prep: NONE SEEN
WBC, Wet Prep HPF POC: <10 (ref <10)

## 2023-10-24 LAB — CBG MONITORING, ED: Glucose-Capillary: 268 mg/dL — ABNORMAL HIGH (ref 70–99)

## 2023-10-24 LAB — PREGNANCY, URINE: Preg Test, Ur: NEGATIVE

## 2023-10-24 MED ORDER — FLUCONAZOLE 150 MG PO TABS: 150.0000 mg | ORAL_TABLET | Freq: Every day | ORAL | 1 refills | Status: DC

## 2023-10-24 NOTE — ED Provider Notes (Signed)
 [place] EMERGENCY DEPARTMENT AT [facility] Provider Note   CSN: [id number] Arrival date & time: 10/24/23  0134     Patient presents with: Vaginal Discharge   [name] is a 33 y.o. female.   Patient presenting with complaints of vaginal discharge for the past week.  She describes a whitish discharge she is concerned may be a yeast infection.  She denies any abdominal pain.  She does report a recent new sexual partner 2 weeks ago, but is unaware of them having any problems.       Prior to Admission medications   Medication Sig Start Date End Date Taking? Authorizing Provider  amLODipine  (NORVASC ) 10 MG tablet Take 1 tablet (10 mg total) by mouth daily. 04/13/23   Rollene Katz, MD  atorvastatin  (LIPITOR) 20 MG tablet Take 1 tablet (20 mg total) by mouth at bedtime. 04/13/23   Rollene Katz, MD  benztropine  (COGENTIN ) 0.5 MG tablet Take 1 tablet (0.5 mg total) by mouth 2 (two) times daily. For eps 04/13/23   Rollene Katz, MD  clindamycin  (CLEOCIN ) 300 MG capsule Take 1 capsule (300 mg total) by mouth 3 (three) times daily. X 7 days 10/09/23   Palumbo, April, MD  cloNIDine  (CATAPRES ) 0.1 MG tablet Take 1 tablet (0.1 mg total) by mouth daily. 04/13/23   Rollene Katz, MD  diclofenac  Sodium (VOLTAREN ) 1 % GEL Apply 4 g topically 4 (four) times daily. 04/13/23   Rollene Katz, MD  doxycycline  (VIBRAMYCIN ) 100 MG capsule Take 1 capsule (100 mg total) by mouth 2 (two) times daily. One po bid x 7 days 09/10/23   Floyd, Dan, DO  gabapentin  (NEURONTIN ) 100 MG capsule Take 1 capsule (100 mg total) by mouth 3 (three) times daily. For agitation 04/13/23   Rollene Katz, MD  glimepiride  (AMARYL ) 4 MG tablet Take 1 tablet (4 mg total) by mouth daily. 04/13/23   Rollene Katz, MD  GLOBAL EASE INJECT PEN NEEDLES 32G X 4 MM MISC Inject 1 each into the skin as directed. 10/20/22   [provider]  haloperidol  (HALDOL ) 10 MG tablet Take 1  tablet (10 mg total) by mouth at bedtime for 14 days. 04/13/23 04/27/23  Rollene Katz, MD  lithium  carbonate (ESKALITH ) 450 MG ER tablet Take 1 tablet (450 mg total) by mouth at bedtime. For mood stabilization 04/13/23   Rollene Katz, MD  loperamide  (IMODIUM ) 2 MG capsule Take 1 capsule (2 mg total) by mouth 4 (four) times daily as needed for diarrhea or loose stools. 09/10/23   Emil Share, DO  metoprolol  tartrate (LOPRESSOR ) 25 MG tablet Take 1 tablet (25 mg total) by mouth 2 (two) times daily for 14 days. 04/13/23 04/27/23  Rollene Katz, MD  ondansetron  (ZOFRAN -ODT) 4 MG disintegrating tablet 4mg  ODT q4 hours prn nausea/vomit 09/10/23   Floyd, Dan, DO  traZODone  (DESYREL ) 150 MG tablet Take 1 tablet (150 mg total) by mouth at bedtime. 04/13/23   Rollene Katz, MD  TRESIBA FLEXTOUCH 100 UNIT/ML FlexTouch Pen Inject 20 Units into the skin at bedtime. 03/04/23   [provider]  venlafaxine  XR (EFFEXOR -XR) 150 MG 24 hr capsule Take 1 capsule (150 mg total) by mouth daily with breakfast. 04/13/23   Rollene Katz, MD    Allergies: Wellbutrin  [bupropion ], Omnipaque  [iohexol ], Penicillins, Atarax  [hydroxyzine ], Buspar  [buspirone ], Contrast media [iodinated contrast media], and Cymbalta  [duloxetine  hcl]    Review of Systems  All other systems reviewed and are negative.   Updated Vital Signs BP 127/82 (BP Location: Right Arm)  Pulse 80   Temp 98.4 F (36.9 C) (Oral)   Resp 17   Ht 5' 7 (1.702 m)   Wt 121.1 kg   SpO2 99%   BMI 41.82 kg/m   Physical Exam Vitals and nursing note reviewed.  Constitutional:      Appearance: Normal appearance.  Pulmonary:     Effort: Pulmonary effort is normal.   Skin:    General: Skin is warm and dry.   Neurological:     Mental Status: She is alert and oriented to person, place, and time.     (all labs ordered are listed, but only abnormal results are displayed) Labs Reviewed  URINALYSIS, ROUTINE W REFLEX  MICROSCOPIC - Abnormal; Notable for the following components:      Result Value   Glucose, UA >1,000 (*)    Leukocytes,Ua LARGE (*)    Bacteria, UA RARE (*)    All other components within normal limits  CBG MONITORING, ED - Abnormal; Notable for the following components:   Glucose-Capillary 268 (*)    All other components within normal limits  WET PREP, GENITAL  PREGNANCY, URINE  GC/CHLAMYDIA PROBE AMP ([place]) NOT AT [facility]    EKG: None  Radiology: No results found.   Procedures   Medications Ordered in the ED - No data to display                                  Medical Decision Making Amount and/or Complexity of Data Reviewed Labs: ordered.   Patient presenting with vaginal discharge and concerns over a yeast infection.  Wet prep is positive for yeast.  Urinalysis is clear.  GC and chlamydia pending.  Patient to be discharged with Diflucan .     Final diagnoses:  None    ED Discharge Orders     None          Geroldine Berg, MD 10/24/23 [phone]

## 2023-10-24 NOTE — ED Triage Notes (Signed)
 Pt reports vaginal burning/pain/discharge/swelling x 3 days. Denies fever. Pt reports new person she's having sexual contact with.

## 2023-10-24 NOTE — Discharge Instructions (Signed)
 Begin taking Diflucan  as prescribed.  Repeat in 1 week if symptoms have not resolved.  We will call you if your cultures indicate you require further treatment or need to take additional action.

## 2023-10-26 LAB — GC/CHLAMYDIA PROBE AMP (~~LOC~~) NOT AT ARMC
Chlamydia: NEGATIVE
Comment: NEGATIVE
Comment: NORMAL
Neisseria Gonorrhea: NEGATIVE

## 2023-10-30 ENCOUNTER — Other Ambulatory Visit: Payer: Self-pay

## 2023-10-30 ENCOUNTER — Emergency Department (HOSPITAL_COMMUNITY)
Admission: EM | Admit: 2023-10-30 | Discharge: 2023-10-31 | Disposition: A | Discharge status: Home / Self Care | Payer: MEDICAID | Attending: Emergency Medicine | Admitting: Emergency Medicine

## 2023-10-30 ENCOUNTER — Encounter (HOSPITAL_COMMUNITY): Payer: Self-pay

## 2023-10-30 DIAGNOSIS — A599 Trichomoniasis, unspecified: Secondary | ICD-10-CM | POA: Diagnosis not present

## 2023-10-30 DIAGNOSIS — L292 Pruritus vulvae: Secondary | ICD-10-CM | POA: Diagnosis present

## 2023-10-30 NOTE — ED Triage Notes (Signed)
 Pt states that she has had a yeast infection for 2 weeks and it is not improving with OTC cream.

## 2023-10-31 LAB — URINALYSIS, ROUTINE W REFLEX MICROSCOPIC
Bilirubin Urine: NEGATIVE
Glucose, UA: >=500 mg/dL — AB
Ketones, ur: NEGATIVE mg/dL
Nitrite: NEGATIVE
Protein, ur: 30 mg/dL — AB
Specific Gravity, Urine: 1.02 (ref 1.005–1.030)
pH: 5 (ref 5.0–8.0)

## 2023-10-31 LAB — WET PREP, GENITAL
Sperm: NONE SEEN
WBC, Wet Prep HPF POC: >=10 — AB (ref <10)
Yeast Wet Prep HPF POC: NONE SEEN

## 2023-10-31 LAB — PREGNANCY, URINE: Preg Test, Ur: NEGATIVE

## 2023-10-31 MED ORDER — METRONIDAZOLE 500 MG PO TABS
2000.0000 mg | ORAL_TABLET | Freq: Once | ORAL | Status: AC
Start: 2023-10-31 — End: 2023-10-31
  Administered 2023-10-31: 2000 mg via ORAL
  Filled 2023-10-31: qty 4

## 2023-10-31 NOTE — ED Provider Notes (Signed)
 [facility]-EMERGENCY DEPT [facility] Emergency Department Provider Note MRN:  [id number]  Arrival date & time: 10/31/23     Chief Complaint   Vaginal Itching   History of Present Illness   [name] is a 33 y.o. year-old female presents to the ED with chief complaint of vaginal itching.  She states she has been having the symptoms for the past week or so, but that they are recurrent.  She reports some associated vaginal discharge.  She also reports some bumps that are painful on her genitals.  She denies any other associated symptoms.  She has tried over-the-counter creams for yeast infection without any relief..  History provided by patient.   Review of Systems  Pertinent positive and negative review of systems noted in HPI.    Physical Exam   Vitals:   10/30/23 2233 10/31/23 0144  BP: (!) 150/106 (!) 145/81  Pulse: 92 79  Resp: 18 19  Temp: 98.9 F (37.2 C) 98.7 F (37.1 C)  SpO2: 94% 98%    CONSTITUTIONAL:  non toxic-appearing, NAD NEURO:  Alert and oriented x 3, CN 3-12 grossly intact EYES:  eyes equal and reactive ENT/NECK:  Supple, no stridor  CARDIO:  normal rate, appears well-perfused  PULM:  No respiratory distress,  GI/GU:  non-distended, chaperone present for exam, there is thin white discharge, there are a few erythematous bumps on the genitals, no bleeding, no evidence of abscess or Bartholin gland cyst. MSK/SPINE:  No gross deformities, no edema, moves all extremities  SKIN:  no rash, atraumatic   *Additional and/or pertinent findings included in MDM below  Diagnostic and Interventional Summary    EKG Interpretation Date/Time:    Ventricular Rate:    PR Interval:    QRS Duration:    QT Interval:    QTC Calculation:   R Axis:      Text Interpretation:         Labs Reviewed  WET PREP, GENITAL - Abnormal; Notable for the following components:      Result Value   Trich, Wet Prep PRESENT (*)    Clue Cells Wet Prep HPF POC PRESENT (*)     WBC, Wet Prep HPF POC >=10 (*)    All other components within normal limits  URINALYSIS, ROUTINE W REFLEX MICROSCOPIC - Abnormal; Notable for the following components:   Glucose, UA >=500 (*)    Hgb urine dipstick SMALL (*)    Protein, ur 30 (*)    Leukocytes,Ua MODERATE (*)    Bacteria, UA RARE (*)    All other components within normal limits  PREGNANCY, URINE  GC/CHLAMYDIA PROBE AMP ([place]) NOT AT [facility]    No orders to display    Medications  metroNIDAZOLE  (FLAGYL ) tablet 2,000 mg (2,000 mg Oral Given 10/31/23 0407)     Procedures  /  Critical Care Procedures  ED Course and Medical Decision Making  I have reviewed the triage vital signs, the nursing notes, and pertinent available records from the EMR.  Social Determinants Affecting Complexity of Care: Patient has no clinically significant social determinants affecting this chief complaint..   ED Course:    Medical Decision Making Patient here with vaginal itching.  She has been having the symptoms intermittently for the past several weeks.  States that symptoms worsen over the past week.  She denies any bleeding.  She states that she has had some sores on her genitals.  Questionable herpes.  Self swabs are sent.  Positive swabs for  trichomoniasis and clue cells.  Treated with Flagyl  here.  PCP follow-up.  Amount and/or Complexity of Data Reviewed Labs: ordered.  Risk Prescription drug management.         Consultants: No consultations were needed in caring for this patient.   Treatment and Plan: Emergency department workup does not suggest an emergent condition requiring admission or immediate intervention beyond  what has been performed at this time. The patient is safe for discharge and has  been instructed to return immediately for worsening symptoms, change in  symptoms or any other concerns    Final Clinical Impressions(s) / ED Diagnoses     ICD-10-CM   1. Trichomoniasis  A59.9       ED  Discharge Orders     None         Discharge Instructions Discussed with and Provided to Patient:     Discharge Instructions      You were treated for trichomonas infection.  Please follow-up with your regular doctor.       Vicky Charleston, PA-C 10/31/23 [id number]    Trine Raynell Moder, MD 10/31/23 [phone]

## 2023-10-31 NOTE — Discharge Instructions (Signed)
 You were treated for trichomonas infection.  Please follow-up with your regular doctor.

## 2023-11-01 LAB — GC/CHLAMYDIA PROBE AMP (~~LOC~~) NOT AT ARMC
Chlamydia: NEGATIVE
Comment: NEGATIVE
Comment: NORMAL
Neisseria Gonorrhea: NEGATIVE

## 2023-11-16 ENCOUNTER — Ambulatory Visit (HOSPITAL_COMMUNITY)
Admission: EM | Admit: 2023-11-16 | Discharge: 2023-11-18 | Payer: MEDICAID | Attending: Family Medicine | Admitting: Family Medicine

## 2023-11-16 DIAGNOSIS — F3160 Bipolar disorder, current episode mixed, unspecified: Secondary | ICD-10-CM | POA: Diagnosis not present

## 2023-11-16 DIAGNOSIS — I10 Essential (primary) hypertension: Secondary | ICD-10-CM | POA: Insufficient documentation

## 2023-11-16 DIAGNOSIS — Z711 Person with feared health complaint in whom no diagnosis is made: Secondary | ICD-10-CM

## 2023-11-16 DIAGNOSIS — Z7984 Long term (current) use of oral hypoglycemic drugs: Secondary | ICD-10-CM | POA: Insufficient documentation

## 2023-11-16 DIAGNOSIS — E1121 Type 2 diabetes mellitus with diabetic nephropathy: Secondary | ICD-10-CM | POA: Diagnosis not present

## 2023-11-16 DIAGNOSIS — F411 Generalized anxiety disorder: Secondary | ICD-10-CM | POA: Diagnosis not present

## 2023-11-16 DIAGNOSIS — R45851 Suicidal ideations: Secondary | ICD-10-CM | POA: Insufficient documentation

## 2023-11-16 DIAGNOSIS — Z91148 Patient's other noncompliance with medication regimen for other reason: Secondary | ICD-10-CM | POA: Insufficient documentation

## 2023-11-16 DIAGNOSIS — F17210 Nicotine dependence, cigarettes, uncomplicated: Secondary | ICD-10-CM | POA: Insufficient documentation

## 2023-11-16 DIAGNOSIS — Z9151 Personal history of suicidal behavior: Secondary | ICD-10-CM | POA: Insufficient documentation

## 2023-11-16 DIAGNOSIS — Z794 Long term (current) use of insulin: Secondary | ICD-10-CM | POA: Insufficient documentation

## 2023-11-16 DIAGNOSIS — Z79899 Other long term (current) drug therapy: Secondary | ICD-10-CM | POA: Insufficient documentation

## 2023-11-16 DIAGNOSIS — Z202 Contact with and (suspected) exposure to infections with a predominantly sexual mode of transmission: Secondary | ICD-10-CM | POA: Insufficient documentation

## 2023-11-16 LAB — COMPREHENSIVE METABOLIC PANEL WITH GFR
ALT: 21 U/L (ref 0–44)
AST: 20 U/L (ref 15–41)
Albumin: 4 g/dL (ref 3.5–5.0)
Alkaline Phosphatase: 48 U/L (ref 38–126)
Anion gap: 8 (ref 5–15)
BUN: 9 mg/dL (ref 6–20)
CO2: 25 mmol/L (ref 22–32)
Calcium: 9.2 mg/dL (ref 8.9–10.3)
Chloride: 105 mmol/L (ref 98–111)
Creatinine, Ser: 0.93 mg/dL (ref 0.44–1.00)
GFR, Estimated: >60 mL/min (ref >60)
Glucose, Bld: 205 mg/dL — ABNORMAL HIGH (ref 70–99)
Potassium: 4.1 mmol/L (ref 3.5–5.1)
Sodium: 138 mmol/L (ref 135–145)
Total Bilirubin: 0.6 mg/dL (ref 0.0–1.2)
Total Protein: 7 g/dL (ref 6.5–8.1)

## 2023-11-16 LAB — HEMOGLOBIN A1C
Hgb A1c MFr Bld: 8.1 % — ABNORMAL HIGH (ref 4.8–5.6)
Mean Plasma Glucose: 185.77 mg/dL

## 2023-11-16 LAB — HIV ANTIBODY (ROUTINE TESTING W REFLEX): HIV Screen 4th Generation wRfx: NONREACTIVE

## 2023-11-16 LAB — CBC WITH DIFFERENTIAL/PLATELET
Abs Immature Granulocytes: 0.03 K/uL (ref 0.00–0.07)
Basophils Absolute: 0.1 K/uL (ref 0.0–0.1)
Basophils Relative: 1 %
Eosinophils Absolute: 0.3 K/uL (ref 0.0–0.5)
Eosinophils Relative: 3 %
HCT: 38.5 % (ref 36.0–46.0)
Hemoglobin: 12.4 g/dL (ref 12.0–15.0)
Immature Granulocytes: 0 %
Lymphocytes Relative: 31 %
Lymphs Abs: 3.5 K/uL (ref 0.7–4.0)
MCH: 26.2 pg (ref 26.0–34.0)
MCHC: 32.2 g/dL (ref 30.0–36.0)
MCV: 81.2 fL (ref 80.0–100.0)
Monocytes Absolute: 0.7 K/uL (ref 0.1–1.0)
Monocytes Relative: 6 %
Neutro Abs: 6.5 K/uL (ref 1.7–7.7)
Neutrophils Relative %: 59 %
Platelets: 229 K/uL (ref 150–400)
RBC: 4.74 MIL/uL (ref 3.87–5.11)
RDW: 14.6 % (ref 11.5–15.5)
WBC: 11.1 K/uL — ABNORMAL HIGH (ref 4.0–10.5)
nRBC: 0 % (ref 0.0–0.2)

## 2023-11-16 LAB — LIPID PANEL
Cholesterol: 162 mg/dL (ref 0–200)
HDL: 53 mg/dL (ref >40)
LDL Cholesterol: 70 mg/dL (ref 0–99)
Total CHOL/HDL Ratio: 3.1 ratio
Triglycerides: 196 mg/dL — ABNORMAL HIGH (ref <150)
VLDL: 39 mg/dL (ref 0–40)

## 2023-11-16 LAB — TSH: TSH: 1.351 u[IU]/mL (ref 0.350–4.500)

## 2023-11-16 LAB — URINALYSIS, ROUTINE W REFLEX MICROSCOPIC
Bacteria, UA: NONE SEEN
Bilirubin Urine: NEGATIVE
Glucose, UA: >=500 mg/dL — AB
Hgb urine dipstick: NEGATIVE
Ketones, ur: NEGATIVE mg/dL
Leukocytes,Ua: NEGATIVE
Nitrite: NEGATIVE
Protein, ur: NEGATIVE mg/dL
Specific Gravity, Urine: 1.023 (ref 1.005–1.030)
pH: 5 (ref 5.0–8.0)

## 2023-11-16 LAB — POCT URINE DRUG SCREEN - MANUAL ENTRY (I-SCREEN)
POC Amphetamine UR: NOT DETECTED
POC Buprenorphine (BUP): NOT DETECTED
POC Cocaine UR: NOT DETECTED
POC Marijuana UR: NOT DETECTED
POC Methadone UR: NOT DETECTED
POC Methamphetamine UR: NOT DETECTED
POC Morphine: NOT DETECTED
POC Oxazepam (BZO): NOT DETECTED
POC Oxycodone UR: NOT DETECTED
POC Secobarbital (BAR): NOT DETECTED

## 2023-11-16 LAB — ETHANOL: Alcohol, Ethyl (B): <15 mg/dL (ref <15)

## 2023-11-16 LAB — LITHIUM LEVEL: Lithium Lvl: 0.13 mmol/L — ABNORMAL LOW (ref 0.60–1.20)

## 2023-11-16 LAB — GLUCOSE, CAPILLARY: Glucose-Capillary: 228 mg/dL — ABNORMAL HIGH (ref 70–99)

## 2023-11-16 MED ORDER — OLANZAPINE 10 MG PO TBDP
10.0000 mg | ORAL_TABLET | ORAL | Status: AC
  Administered 2023-11-16: 10 mg via ORAL
  Filled 2023-11-16: qty 1

## 2023-11-16 MED ORDER — ACETAMINOPHEN 325 MG PO TABS
650.0000 mg | ORAL_TABLET | Freq: Four times a day (QID) | ORAL | Status: DC | PRN
Start: 2023-11-16 — End: 2023-11-17

## 2023-11-16 MED ORDER — DIPHENHYDRAMINE HCL 50 MG PO CAPS: 50.0000 mg | ORAL_CAPSULE | Freq: Three times a day (TID) | ORAL | Status: DC | PRN

## 2023-11-16 MED ORDER — INSULIN ASPART 100 UNIT/ML IJ SOLN
0.0000 [IU] | Freq: Three times a day (TID) | INTRAMUSCULAR | Status: DC
  Administered 2023-11-17: 3 [IU] via SUBCUTANEOUS
  Administered 2023-11-17: 11 [IU] via SUBCUTANEOUS
  Administered 2023-11-17 – 2023-11-18 (×2): 5 [IU] via SUBCUTANEOUS

## 2023-11-16 MED ORDER — DIPHENHYDRAMINE HCL 50 MG/ML IJ SOLN: 50.0000 mg | Freq: Three times a day (TID) | INTRAMUSCULAR | Status: DC | PRN

## 2023-11-16 MED ORDER — HALOPERIDOL LACTATE 5 MG/ML IJ SOLN: 5.0000 mg | Freq: Three times a day (TID) | INTRAMUSCULAR | Status: DC | PRN

## 2023-11-16 MED ORDER — TRAZODONE HCL 100 MG PO TABS
100.0000 mg | ORAL_TABLET | Freq: Every evening | ORAL | Status: DC | PRN
  Administered 2023-11-16: 100 mg via ORAL
  Filled 2023-11-16: qty 1

## 2023-11-16 MED ORDER — LORAZEPAM 2 MG/ML IJ SOLN: 2.0000 mg | Freq: Three times a day (TID) | INTRAMUSCULAR | Status: DC | PRN

## 2023-11-16 MED ORDER — MAGNESIUM HYDROXIDE 400 MG/5ML PO SUSP: 30.0000 mL | Freq: Every day | ORAL | Status: DC | PRN

## 2023-11-16 MED ORDER — INSULIN GLARGINE-YFGN 100 UNIT/ML ~~LOC~~ SOLN
10.0000 [IU] | Freq: Every day | SUBCUTANEOUS | Status: DC
  Administered 2023-11-16 – 2023-11-17 (×2): 10 [IU] via SUBCUTANEOUS

## 2023-11-16 MED ORDER — BENZTROPINE MESYLATE 0.5 MG PO TABS
0.5000 mg | ORAL_TABLET | Freq: Two times a day (BID) | ORAL | Status: DC
  Administered 2023-11-16 – 2023-11-17 (×3): 0.5 mg via ORAL
  Filled 2023-11-16 (×3): qty 1

## 2023-11-16 MED ORDER — AMLODIPINE BESYLATE 10 MG PO TABS
10.0000 mg | ORAL_TABLET | Freq: Every day | ORAL | Status: DC
  Administered 2023-11-17: 10 mg via ORAL
  Filled 2023-11-16: qty 1

## 2023-11-16 MED ORDER — DIVALPROEX SODIUM ER 500 MG PO TB24
500.0000 mg | ORAL_TABLET | Freq: Every day | ORAL | Status: DC
  Administered 2023-11-16 – 2023-11-17 (×2): 500 mg via ORAL
  Filled 2023-11-16 (×2): qty 1

## 2023-11-16 MED ORDER — HALOPERIDOL LACTATE 5 MG/ML IJ SOLN: 10.0000 mg | Freq: Three times a day (TID) | INTRAMUSCULAR | Status: DC | PRN

## 2023-11-16 MED ORDER — HALOPERIDOL 5 MG PO TABS: 5.0000 mg | ORAL_TABLET | Freq: Three times a day (TID) | ORAL | Status: DC | PRN

## 2023-11-16 MED ORDER — GABAPENTIN 100 MG PO CAPS
200.0000 mg | ORAL_CAPSULE | Freq: Three times a day (TID) | ORAL | Status: DC
  Administered 2023-11-16 – 2023-11-17 (×4): 200 mg via ORAL
  Filled 2023-11-16 (×4): qty 2

## 2023-11-16 MED ORDER — ALUM & MAG HYDROXIDE-SIMETH 200-200-20 MG/5ML PO SUSP
30.0000 mL | ORAL | Status: DC | PRN
Start: 2023-11-16 — End: 2023-11-18

## 2023-11-16 MED ORDER — GABAPENTIN 100 MG PO CAPS: 200.0000 mg | ORAL_CAPSULE | Freq: Three times a day (TID) | ORAL | Status: DC

## 2023-11-16 NOTE — BH Assessment (Signed)
 Comprehensive Clinical Assessment (CCA) Note  11/16/2023 [name] [id number]  DISPOSITION: Per Luke Lesches, pt is recommended for Inpatient psychiatric admission.   The patient demonstrates the following risk factors for suicide: Chronic risk factors for suicide include: psychiatric disorder of Schizoaffective d/o, Bipolar 1 d/o, OCD, MDD, GAD, IDD/Borderline Intellectual Functioning and previous suicide attempts in the past. Acute risk factors for suicide include: family or marital conflict, unemployment, and social withdrawal/isolation. Protective factors for this patient include: positive therapeutic relationship, responsibility to others (children, family), and hope for the future. Considering these factors, the overall suicide risk at this point appears to be high. Patient is appropriate for outpatient follow up.   Pt is a 33 yo female who presented voluntarily and unaccompanied after she called 911 for help. Pt stated that she has been arguing with everyone all the time and became tearful. Pt stated that her mother and family will not let her go around the children which upsets her and she stated she does not understand. Pt was making flirtateous comments to the female security guard during the assessment. Hx of Schizoaffective d/o, Bipolar 1, OCD, MDD, GAD and IDD/Borderline Intellectual functioning. Pt is well known at [facility]. Pt has an ACT Team through [facility]. Pt stated she last saw her ACT Team today. Pt stated that she is having SI with thoughts of intentionally overdosing on her prescribed medications. Pt stated she did not OD because she is out of her medications at the moment. Pt denied HI, NSSH and VH. Pt stated that often she hears people talking about my bald head and she is not sure if they are eal people or AH. Pt has a hx of multiple IP psychiatric admissions. Pt denied any substance use aside from smoking multiple packs of cigarettes daily. Pt stated that she  lives in her own apartment with her boyfriend who is not on the lease.    Chief Complaint: No chief complaint on file.  Visit Diagnosis:  Schizoaffective d/o Bipolar 1 d/o MDD GAD  IDD/Borderline Intellectual Functioning   CCA Screening, Triage and Referral (STR)  Patient Reported Information How did you hear about us ? Legal System  What Is the Reason for Your Visit/Call Today? Pt is a 33 yo female who presented voluntarily and unaccompanied after she called 911 for help. Pt stated that she has been arguing with everyone all the time and became tearful. Pt stated that her mother and family will not let her go around the children which upsets her and she stated she does not understand. Pt was making flirtateous comments to the female security guard during the assessment. Hx of Schizoaffective d/o, Bipolar 1, OCD, MDD, GAD and IDD/Borderline Intellectual functioning. Pt is well known at [facility]. Pt has an ACT Team through [facility]. Pt stated she last saw her ACT Team today. Pt stated that she is having SI with thoughts of intentionally overdosing on her prescribed medications. Pt stated she did not OD because she is out of her medications at the moment. Pt denied HI, NSSH and VH. Pt stated that often she hears people talking about my bald head and she is not sure if they are eal people or AH. Pt has a hx of multiple IP psychiatric admissions. Pt denied any substance use aside from smoking multiple packs of cigarettes daily. Pt stated that she lives in her own apartment with her boyfriend who is not on the lease.  How Long Has This Been Causing You Problems? > than 6  months  What Do You Feel Would Help You the Most Today? Treatment for Depression or other mood problem   Have You Recently Had Any Thoughts About Hurting Yourself? Yes  Are You Planning to Commit Suicide/Harm Yourself At This time? Yes   Flowsheet Row ED from 11/16/2023 in [facility] ED from 10/30/2023 in [facility] Emergency Department at [facility] ED from 10/24/2023 in [facility] Emergency Department at [facility]  C-SSRS RISK CATEGORY High Risk No Risk No Risk    Have you Recently Had Thoughts About Hurting Someone [place]? No  Are You Planning to Harm Someone at This Time? No  Explanation: n/a   Have You Used Any Alcohol or Drugs in the Past 24 Hours? No  How Long Ago Did You Use Drugs or Alcohol? No data recorded What Did You Use and How Much? n/a   Do You Currently Have a Therapist/Psychiatrist? Yes  Name of Therapist/Psychiatrist: Name of Therapist/Psychiatrist: [facility] ACT Team   Have You Been Recently Discharged From Any Office Practice or Programs? No  Explanation of Discharge From Practice/Program: NA     CCA Screening Triage Referral Assessment Type of Contact: Face-to-Face  Telemedicine Service Delivery:   Is this Initial or Reassessment?   Date Telepsych consult ordered in [facility]:    Time Telepsych consult ordered in [facility]:    Location of Assessment: [facility] Assessment Services  Provider Location: [facility] Assessment Services   Collateral Involvement: none   Does Patient Have a Automotive engineer Guardian? No  Legal Guardian Contact Information: na  Copy of Legal Guardianship Form: -- (na)  Legal Guardian Notified of Arrival: -- (na)  Legal Guardian Notified of Pending Discharge: -- (na)  If Minor and Not Living with Parent(s), Who has Custody? adult  Is CPS involved or ever been involved? -- (none reported)  Is APS involved or ever been involved? -- (none reported)   Patient Determined To Be At Risk for Harm To Self or Others Based on Review of Patient Reported Information or Presenting Complaint? Yes, for Self-Harm  Method: Plan with intent and identified person  Availability of Means: Has close by  Intent: Clearly intends on inflicting harm that could cause death  Notification  Required: No need or identified person  Additional Information for Danger to Others Potential: Previous attempts  Additional Comments for Danger to Others Potential: na  Are There Guns or Other Weapons in Your Home? No (denied)  Types of Guns/Weapons: na  Are These Weapons Safely Secured?                            -- (na)  Who Could Verify You Are Able To Have These Secured: na  Do You Have any Outstanding Charges, Pending Court Dates, Parole/Probation? pt denies  Contacted To Inform of Risk of Harm To Self or Others: -- (na)    Does Patient Present under Involuntary Commitment? No    [place] of Residence: [place]   Patient Currently Receiving the Following Services: ACTT Psychologist, educational)   Determination of Need: Emergent (2 hours) (Per Luke Lesches, pt is recommended for Inpatient psychiatric admission.)   Options For Referral: Inpatient Hospitalization     CCA Biopsychosocial Patient Reported Schizophrenia/Schizoaffective Diagnosis in Past: Yes   Strengths: Able to ask for help and accept help   Mental Health Symptoms Depression:  Irritability; Fatigue; Difficulty Concentrating; Tearfulness; Increase/decrease in appetite; Sleep (too  much or little)   Duration of Depressive symptoms: Duration of Depressive Symptoms: Greater than two weeks   Mania:  Change in energy/activity; Increased Energy; Irritability; Recklessness; Racing thoughts; Overconfidence   Anxiety:   Worrying; Tension; Restlessness; Irritability; Fatigue; Difficulty concentrating   Psychosis:  None   Duration of Psychotic symptoms:    Trauma:  None   Obsessions:  None   Compulsions:  None   Inattention:  N/A   Hyperactivity/Impulsivity:  Feeling of restlessness; Talks excessively; Fidgets with hands/feet   Oppositional/Defiant Behaviors:  N/A   Emotional Irregularity:  Recurrent suicidal behaviors/gestures/threats; Potentially harmful impulsivity; Mood lability;  Intense/inappropriate anger   Other Mood/Personality Symptoms:  None    Mental Status Exam Appearance and self-care  Stature:  Average   Weight:  Obese   Clothing:  Casual; Disheveled   Grooming:  Neglected   Cosmetic use:  Age appropriate   Posture/gait:  Normal   Motor activity:  Restless   Sensorium  Attention:  Distractible   Concentration:  Normal   Orientation:  Object; Person; Place; Time; X5; Situation   Recall/memory:  Normal   Affect and Mood  Affect:  Tearful; Anxious; Depressed; Flat   Mood:  Anxious; Angry; Depressed   Relating  Eye contact:  Normal   Facial expression:  Tense; Anxious; Responsive   Attitude toward examiner:  Cooperative; Dramatic   Thought and Language  Speech flow: Loud; Clear and Coherent   Thought content:  Appropriate to Mood and Circumstances   Preoccupation:  None   Hallucinations:  None   Organization:  Coherent   [employer] of Knowledge:  Fair   Intelligence:  Below average (per chart: Borderline Intellectual Functioning)   Abstraction:  Functional   Judgement:  Impaired   Reality Testing:  Distorted   Insight:  Lacking   Decision Making:  Impulsive   Social Functioning  Social Maturity:  Impulsive; Irresponsible   Social Judgement:  Heedless; Victimized   Stress  Stressors:  Family conflict; Relationship   Coping Ability:  Overwhelmed   Skill Deficits:  Intellect/education; Programmer, applications; Interpersonal; Communication; Decision making   Supports:  Friends/Service system     Religion: Religion/Spirituality Are You A Religious Person?: No How Might This Affect Treatment?: N/A  Leisure/Recreation: Leisure / Recreation Do You Have Hobbies?: No  Exercise/Diet: Exercise/Diet Do You Exercise?: No Have You Gained or Lost A Significant Amount of Weight in the Past Six Months?: No Do You Follow a Special Diet?: No Do You Have Any Trouble Sleeping?: Yes (uta) Explanation of  Sleeping Difficulties: Pt stated that she taked Trazadone to help with sleep.   CCA Employment/Education Employment/Work Situation: Employment / Work Systems developer: On disability Why is Patient on Disability: unable to assess at this time due to pt's tearfulness How Long has Patient Been on Disability: unable to assess at this time due to pt's tearfulness Patient's Job has Been Impacted by Current Illness: No Has Patient ever Been in the [employer]?: No  Education: Education Is Patient Currently Attending School?: No Last Grade Completed: 11 Did You Attend College?: No Did You Have An Individualized Education Program (IIEP): No Did You Have Any Difficulty At School?: No   CCA Family/Childhood History Family and Relationship History: Family history Marital status: Single Does patient have children?: Yes How many children?:  (unable to assess at this time due to pt's tearfulness) How is patient's relationship with their children?: unable to assess at this time due to pt's tearfulness  Childhood History:  Childhood History By whom was/is the patient raised?: Mother Did patient suffer any verbal/emotional/physical/sexual abuse as a child?: No Has patient ever been sexually abused/assaulted/raped as an adolescent or adult?: No Witnessed domestic violence?: No Has patient been affected by domestic violence as an adult?: No       CCA Substance Use Alcohol/Drug Use: Alcohol / Drug Use Pain Medications: See MAR Prescriptions: See MAR Over the Counter: See MAR History of alcohol / drug use?: No history of alcohol / drug abuse Longest period of sobriety (when/how long): N/A Negative Consequences of Use:  (N/A) Withdrawal Symptoms:  (N/A)                         ASAM's:  Six Dimensions of Multidimensional Assessment  Dimension 1:  Acute Intoxication and/or Withdrawal Potential:   Dimension 1:  Description of individual's past and current  experiences of substance use and withdrawal: NA  Dimension 2:  Biomedical Conditions and Complications:   Dimension 2:  Description of patient's biomedical conditions and  complications: NA  Dimension 3:  Emotional, Behavioral, or Cognitive Conditions and Complications:  Dimension 3:  Description of emotional, behavioral, or cognitive conditions and complications: NA  Dimension 4:  Readiness to Change:  Dimension 4:  Description of Readiness to Change criteria: NA  Dimension 5:  Relapse, Continued use, or Continued Problem Potential:  Dimension 5:  Relapse, continued use, or continued problem potential critiera description: NA  Dimension 6:  Recovery/Living Environment:  Dimension 6:  Recovery/Iiving environment criteria description: NA  ASAM Severity Score: ASAM's Severity Rating Score: 0  ASAM Recommended Level of Treatment: ASAM Recommended Level of Treatment:  (n/a)   Substance use Disorder (SUD) Substance Use Disorder (SUD)  Checklist Symptoms of Substance Use:  (Pt denies substance use)  Recommendations for Services/Supports/Treatments: Recommendations for Services/Supports/Treatments Recommendations For Services/Supports/Treatments: Medication Management, ACCTT (Assertive Community Treatment), Inpatient Hospitalization, Day Treatment  Disposition Recommendation per psychiatric provider: We recommend inpatient psychiatric hospitalization when medically cleared. Patient is under voluntary admission status at this time; please IVC if attempts to leave hospital.   DSM5 Diagnoses: Patient Active Problem List   Diagnosis Date Noted   Agitation 09/28/2022   Suicidal ideations 01/04/2022   Suicidal overdose (HCC) 03/23/2021   Syphilis 07/15/2020   Malingering 06/05/2020   Gastroesophageal reflux disease 05/04/2020   Hyperglycemia due to type 2 diabetes mellitus (HCC) 05/04/2020   Long term (current) use of insulin  (HCC) 05/04/2020   Migraine without aura 05/04/2020   Morbid obesity  (HCC) 05/04/2020   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 2 diabetes mellitus (HCC) 05/04/2020   Prolapsed internal hemorrhoids 05/04/2020   Vitamin D  deficiency 05/04/2020   Other symptoms and signs involving cognitive functions and awareness 05/04/2020   Suicide attempt [facility])    Anxiety state 03/06/2020   Schizophrenia (HCC) 09/13/2019   Bipolar I disorder, most recent episode depressed (HCC) 06/23/2019   MDD (major depressive disorder) 10/10/2018   Schizoaffective disorder, bipolar type (HCC) 09/25/2018   Affective psychosis, bipolar (HCC) 06/13/2018   HTN (hypertension) 05/03/2018   Tobacco use disorder 05/03/2018   Adjustment disorder with emotional disturbance 01/02/2018   Schizophrenia, 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   Moderate bipolar I disorder, most recent episode depressed (HCC)    Schizoaffective disorder (HCC)    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/2017   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   OCD (obsessive compulsive disorder) 10/05/2016   Major depressive disorder, recurrent episode, mild (HCC) 05/04/2016   Borderline  intellectual functioning 07/18/2015   Learning disability 07/18/2015   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/2015   Diabetes mellitus (HCC) 07/18/2015   MDD (major depressive disorder), recurrent, severe, 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   Hyperlipidemia 07/18/2015   Severe episode of recurrent major depressive disorder, without psychotic features (HCC)    Suicidal ideation    OD (overdose of drug), intentional self-harm, initial encounter [facility])    Cognitive deficits 10/12/2012   Generalized anxiety disorder 06/28/2012     Referrals to Alternative Service(s): Referred to Alternative Service(s):   Place:   Date:   Time:    Referred to Alternative Service(s):   Place:   Date:   Time:    Referred to Alternative Service(s):   Place:   Date:   Time:    Referred to Alternative Service(s):   Place:   Date:   Time:     Rocio Wolak T, Counselor

## 2023-11-16 NOTE — Progress Notes (Signed)
   11/16/23 1748  [place] Suicide Severity Rating Scale  1. In the past month -  Have you wished you were dead or wished you could go to sleep and not wake up? Yes  2. In the past month - Have you actually had any thoughts of killing yourself? Yes  3. In the past month - Have you been thinking about how you might kill yourself? Yes  4. In the past month - Have you had these thoughts and had some intention of acting on them? Yes  5. In the past month - Have you started to work out or worked out the details of how to kill yourself? Do you intend to carry out this plan? Yes  6. Have you ever done anything, started to do anything, or prepared to do anything to end your life? Yes  7. Was this within the past three months? Yes  C-SSRS RISK CATEGORY High Risk

## 2023-11-16 NOTE — Progress Notes (Signed)
 Pt has been accepted to [facility] on 11/16/2023 Bed assignment: Main campus  Pt meets inpatient criteria per Suzen Lesches, NP.   Attending Physician will be Millie Manners, MD  Report can be called to: [phone] (this is a pager, please leave call-back number when giving report)  Pt can arrive after 8 AM  Care Team Notified: Cordella Rubins, LPN

## 2023-11-16 NOTE — ED Provider Notes (Signed)
 [facility] Continuous Assessment Admission H&P  Date: 11/16/23 Patient Name: [name] MRN: [id number] Chief Complaint:  Diagnoses:  Final diagnoses:  Bipolar I disorder, most recent episode depressed (HCC)  Concern about STD in female without diagnosis  Type 2 diabetes mellitus with diabetic nephropathy, with long-term current use of insulin  (HCC)  Essential hypertension  Bipolar affective disorder, current episode mixed, without psychotic features (HCC)  Hypertension, unspecified type  Generalized anxiety disorder    HPI:  [name] presented to [facility] as a walk in accompanied by law enforcement with complaints of need to get help.  [name], 33 y.o., female patient seen face to face by this provider, consulted with Dr. Cole; and chart reviewed on 11/16/23.     On evaluation [name] reports needing to get help so she called 911.  Patient reports when she was seen here at [facility] back in June she should have stayed but wanted to leave and reports her mental health has steadily decompensated since she was seen here in June.  Patient is followed by [facility] ACT Team  [phone], ACT Team contact name is Garrel.  It is unclear as to when Latha last saw her ACT team as she states,  I saw them today, and later states, I don't have any medicine.  Patient states that her current medication regimen is not working and she is currently prescribed Cogentin , lithium , Effexor , gabapentin , and insulin  and she reports she is prescribed a pill for her diabetes but cannot recall the name of it.  Attempted to retrieve patient's medication list from the dispense pharmacy report no medications were able to be retrieved at this time.  Patient has a history significant of diabetes on chart review at her last PCP visit she was taking and was prescribed Norvasc  10 mg, Lantus  for her diabetes, glimepiride  for diabetes. When patient was last here at [facility]  June 2025 she was prescribed gabapentin . 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  Patient states that she would like to restart Depakote  as she was prescribed previously and did well with that however reports her ACT that it made her increasingly sleepy and was taken off of the medication.  Patient further states that she would prefer to be drowsy and at home opposed to out in the streets looking for trouble.   Endorses compliance with all of her medical and mental health medications.  Patient endorses that she has been increasingly suicidal over the last few weeks with a plan to overdose on medication.  Reports she would have overdosed on her home meds that she had had any at home today.  Patient's history is significant for prior suicidal attempts by overdose.  She reports that she resides with her boyfriend. She reports that she has been increasingly irritable and expressed anger towards her boyfriend, mom and sister.  He reports she is angry at her mom as her mom would not allow her to come over to play with the dogs as her mom told her that she has demons and may give her some type of an illness. Denies any recent illicit substance use this patient has a history significant for cocaine and cannabis use.  She denies any use of alcohol or any dependence on alcohol.  She endorses that she smokes cigarettes up to 5 packs/day.  Patient endorses that she is willing to be admitted here voluntarily  and is consenting to the inpatient psychiatric treatment for stabilization of acute mental health crisis and suicidal ideations.  Patient should be IVC if she attempts to leave.  Given history of prior suicidal attempts and active suicidality with a plan.  Objectively patient is grossly hypersexual today.  She is initially seen pacing around the assessment unit [name] however he is redirectable and enters and sits down in the exam room once  this writer request her to.  Prior to quantity assessment room patient was calm and inappropriately to 2 of the security guards ,I do girls to, being very intrusive and flirtatious although remains redirectable.  Patient's speech is pressured and loud.  Patient's mood labile as she is tearfully crying, laughing, anxious, euphoric, with a congruent affect.  Patient is alert and oriented and overall generally cooperative with request.  Patient is yelling out different words at random such as sex but reports this is normal for her as she has uncontrollable elicitation of words.  She reports that she hears people talking and often calling her baldheaded bitch but she does not know if this is actually people or if she is hearing voices.  She says that her ACT Team tells her that she is hearing voices as she has observed hearing this statement when no one else was stating in or around.  She denies seeing people or things that are not there.  She does endorse today suicidal ideations with a plan to overdose on her medicines.  Denies any intentional self-harming behaviors or homicidal ideations.  Objectively patient is not exhibiting any paranoid or overt psychotic behaviors.  Patient does appear to be manic while also exhibiting some symptoms of depression.  Patient's presentation is clinically consistent with diagnosis of bipolar disorder patient needs inpatient psychiatric treatment criteria and a bed has been requested in [facility].  At present there is no appropriate bed availability disposition social work will fax patient out.  Unable to reach ACT team as it is after hours will reach out to them on tomorrow.    Total Time spent with patient: 45 minutes  Musculoskeletal  Strength & Muscle Tone: within normal limits Gait & Station: normal Patient leans: N/A  Psychiatric Specialty Exam  Presentation General Appearance:  Fairly Groomed  Eye  Contact: Fleeting  Speech: Pressured  Speech Volume: Increased  Handedness: Right   Mood and Affect  Mood: Labile; Irritable; Euphoric; Anxious  Affect: Labile   Thought Process  Thought Processes: Irrevelant  Descriptions of Associations:Circumstantial  Orientation:Full (Time, Place and Person)  Thought Content:WDL  Diagnosis of Schizophrenia or Schizoaffective disorder in past: Yes  Duration of Psychotic Symptoms: Less than six months  Hallucinations:Hallucinations: Auditory Description of Auditory Hallucinations: hear people  calling me a balded headed bitch  Ideas of Reference:None  Suicidal Thoughts:Suicidal Thoughts: Yes, Active SI Active Intent and/or Plan: With Plan (overdose on home medications)  Homicidal Thoughts:Homicidal Thoughts: No   Sensorium  Memory: Immediate Fair; Remote Fair  Judgment: Fair  Insight: Present   Executive Functions  Concentration: Poor  Attention Span: Poor  Recall: [employer] of Knowledge: Fair  Language: Fair   Psychomotor Activity  Psychomotor Activity:No data recorded  Assets  Assets: Resilience; Financial Resources/Insurance; Housing   Sleep  Sleep:Sleep: Poor Number of Hours of Sleep: -- (Not sleeping over the last few weeks)   Nutritional Assessment (For OBS and FBC admissions only) Has the patient had a weight loss or gain of 10 pounds or  more in the last 3 months?: No Has the patient had a decrease in food intake/or appetite?: No Does the patient have dental problems?: No Does the patient have eating habits or behaviors that may be indicators of an eating disorder including binging or inducing vomiting?: No Has the patient recently lost weight without trying?: 0    Physical Exam Constitutional:      General: She is not in acute distress.    Appearance: Normal appearance. She is not ill-appearing.  HENT:     Head: Normocephalic and atraumatic.     Nose: Nose normal.  Eyes:      Extraocular Movements: Extraocular movements intact.     Conjunctiva/sclera: Conjunctivae normal.     Pupils: Pupils are equal, round, and reactive to light.  Cardiovascular:     Rate and Rhythm: Normal rate and regular rhythm.  Pulmonary:     Effort: Pulmonary effort is normal.     Breath sounds: Normal breath sounds.  Musculoskeletal:     Cervical back: Normal range of motion and neck supple.  Skin:    General: Skin is warm and dry.  Neurological:     General: No focal deficit present.     Mental Status: She is alert and oriented to person, place, and time.     ROS  Blood pressure (!) 147/74, pulse 91, temperature 99 F (37.2 C), temperature source Oral, resp. rate 18, SpO2 99%. There is no height or weight on file to calculate BMI.  Past Psychiatric History: Schizoaffective disorder, schizophrenia, GAD, and depression  Is the patient at risk to self? Yes  Has the patient been a risk to self in the past 6 months? Yes .    Has the patient been a risk to self within the distant past? Yes   Is the patient a risk to others? No   Has the patient been a risk to others in the past 6 months? No   Has the patient been a risk to others within the distant past? No   Past Medical History: Type 2 diabetes, hypertension, hyperlipidemia,  Family History: No family history provided  Social History: See HPI  Last Labs:  Admission on 10/30/2023, Discharged on 10/31/2023  Component Date Value Ref Range Status   Color, Urine 10/31/2023 YELLOW  YELLOW Final   APPearance 10/31/2023 CLEAR  CLEAR Final   Specific Gravity, Urine 10/31/2023 1.020  1.005 - 1.030 Final   pH 10/31/2023 5.0  5.0 - 8.0 Final   Glucose, UA 10/31/2023 >=500 (A)  NEGATIVE mg/dL Final   Hgb urine dipstick 10/31/2023 SMALL (A)  NEGATIVE Final   Bilirubin Urine 10/31/2023 NEGATIVE  NEGATIVE Final   Ketones, ur 10/31/2023 NEGATIVE  NEGATIVE mg/dL Final   Protein, ur 92/93/7974 30 (A)  NEGATIVE mg/dL Final    Nitrite 92/93/7974 NEGATIVE  NEGATIVE Final   Leukocytes,Ua 10/31/2023 MODERATE (A)  NEGATIVE Final   RBC / HPF 10/31/2023 0-5  0 - 5 RBC/hpf Final   WBC, UA 10/31/2023 6-10  0 - 5 WBC/hpf Final   Bacteria, UA 10/31/2023 RARE (A)  NONE SEEN Final   Squamous Epithelial / HPF 10/31/2023 0-5  0 - 5 /HPF Final   Mucus 10/31/2023 PRESENT   Final   Performed at [facility], [address], [place], [place] [address]   Yeast Wet Prep HPF POC 10/31/2023 NONE SEEN  NONE SEEN Final   Trich, Wet Prep 10/31/2023 PRESENT (A)  NONE SEEN Final   Clue Cells Wet  Prep HPF POC 10/31/2023 PRESENT (A)  NONE SEEN Final   WBC, Wet Prep HPF POC 10/31/2023 >=10 (A)  <10 Final   Sperm 10/31/2023 NONE SEEN   Final   Performed at [facility], [address] [place], [place] [address]   Neisseria Gonorrhea 10/31/2023 Negative   Final   Chlamydia 10/31/2023 Negative   Final   Comment 10/31/2023 Normal Reference Ranger Chlamydia - Negative   Final   Comment 10/31/2023 Normal Reference Range Neisseria Gonorrhea - Negative   Final   Preg Test, Ur 10/31/2023 NEGATIVE  NEGATIVE Final   Comment:        THE SENSITIVITY OF THIS METHODOLOGY IS >20 mIU/mL. Performed at [facility], [address], [place], [place] [address]   Admission on 10/24/2023, Discharged on 10/24/2023  Component Date Value Ref Range Status   Color, Urine 10/24/2023 YELLOW  YELLOW Final   APPearance 10/24/2023 CLEAR  CLEAR Final   Specific Gravity, Urine 10/24/2023 1.027  1.005 - 1.030 Final   pH 10/24/2023 5.5  5.0 - 8.0 Final   Glucose, UA 10/24/2023 >1,000 (A)  NEGATIVE mg/dL Final   Hgb urine dipstick 10/24/2023 NEGATIVE  NEGATIVE Final   Bilirubin Urine 10/24/2023 NEGATIVE  NEGATIVE Final   Ketones, ur 10/24/2023 NEGATIVE  NEGATIVE mg/dL Final   Protein, ur 93/70/7974 NEGATIVE  NEGATIVE mg/dL Final   Nitrite 93/70/7974 NEGATIVE  NEGATIVE Final   Leukocytes,Ua 10/24/2023 LARGE (A)   NEGATIVE Final   RBC / HPF 10/24/2023 11-20  0 - 5 RBC/hpf Final   WBC, UA 10/24/2023 >50  0 - 5 WBC/hpf Final   Bacteria, UA 10/24/2023 RARE (A)  NONE SEEN Final   Squamous Epithelial / HPF 10/24/2023 0-5  0 - 5 /HPF Final   Mucus 10/24/2023 PRESENT   Final   Performed at [facility], [address], [place], [place] [address]   Preg Test, Ur 10/24/2023 NEGATIVE  NEGATIVE Final   Comment:        THE SENSITIVITY OF THIS METHODOLOGY IS >20 mIU/mL. Performed at [facility], [address] [place], [place] [address]    Glucose-Capillary 10/24/2023 268 (H)  70 - 99 mg/dL Final   Glucose reference range applies only to samples taken after fasting for at least 8 hours.   Neisseria Gonorrhea 10/24/2023 Negative   Final   Chlamydia 10/24/2023 Negative   Final   Comment 10/24/2023 Normal Reference Ranger Chlamydia - Negative   Final   Comment 10/24/2023 Normal Reference Range Neisseria Gonorrhea - Negative   Final   Yeast Wet Prep HPF POC 10/24/2023 PRESENT (A)  NONE SEEN Final   Trich, Wet Prep 10/24/2023 NONE SEEN  NONE SEEN Final   Clue Cells Wet Prep HPF POC 10/24/2023 NONE SEEN  NONE SEEN Final   WBC, Wet Prep HPF POC 10/24/2023 <10  <10 Final   Swab received with less than 0.5 mL of saline, saline added to specimen, interpret results with caution.   Sperm 10/24/2023 NONE SEEN   Final   Performed at [facility], [address] [place], [place] [address]  Admission on 07/13/2023, Discharged on 07/13/2023  Component Date Value Ref Range Status   Sodium 07/13/2023 138  135 - 145 mmol/L Final   Potassium 07/13/2023 3.8  3.5 - 5.1 mmol/L Final   Chloride 07/13/2023 104  98 - 111 mmol/L Final   CO2 07/13/2023 25  22 - 32 mmol/L Final   Glucose, Bld 07/13/2023 247 (H)  70 - 99 mg/dL Final   Glucose reference range applies only to samples taken after fasting for at least 8 hours.   BUN 07/13/2023 8  6 - 20 mg/dL Final    Creatinine, Ser 07/13/2023 0.75  0.44 - 1.00 mg/dL Final   Calcium  07/13/2023 9.0  8.9 - 10.3 mg/dL Final   Total Protein 96/81/7974 7.6  6.5 - 8.1 g/dL Final   Albumin 96/81/7974 4.0  3.5 - 5.0 g/dL Final   AST 96/81/7974 24  15 - 41 U/L Final   ALT 07/13/2023 20  0 - 44 U/L Final   Alkaline Phosphatase 07/13/2023 54  38 - 126 U/L Final   Total Bilirubin 07/13/2023 0.4  0.0 - 1.2 mg/dL Final   GFR, Estimated 07/13/2023 >60  >60 mL/min Final   Comment: (NOTE) Calculated using the CKD-EPI Creatinine Equation (2021)    Anion gap 07/13/2023 9  5 - 15 Final   Performed at [facility], [address], [place], [place] [address]   Lipase 07/13/2023 150 (H)  11 - 51 U/L Final   Performed at [facility], [address] [place], [place] [address]   WBC 07/13/2023 8.1  4.0 - 10.5 K/uL Final   RBC 07/13/2023 4.54  3.87 - 5.11 MIL/uL Final   Hemoglobin 07/13/2023 11.2 (L)  12.0 - 15.0 g/dL Final   HCT 96/81/7974 37.3  36.0 - 46.0 % Final   MCV 07/13/2023 82.2  80.0 - 100.0 fL Final   MCH 07/13/2023 24.7 (L)  26.0 - 34.0 pg Final   MCHC 07/13/2023 30.0  30.0 - 36.0 g/dL Final   RDW 96/81/7974 14.6  11.5 - 15.5 % Final   Platelets 07/13/2023 247  150 - 400 K/uL Final   nRBC 07/13/2023 0.0  0.0 - 0.2 % Final   Neutrophils Relative % 07/13/2023 60  % Final   Neutro Abs 07/13/2023 5.0  1.7 - 7.7 K/uL Final   Lymphocytes Relative 07/13/2023 29  % Final   Lymphs Abs 07/13/2023 2.3  0.7 - 4.0 K/uL Final   Monocytes Relative 07/13/2023 6  % Final   Monocytes Absolute 07/13/2023 0.5  0.1 - 1.0 K/uL Final   Eosinophils Relative 07/13/2023 4  % Final   Eosinophils Absolute 07/13/2023 0.3  0.0 - 0.5 K/uL Final   Basophils Relative 07/13/2023 1  % Final   Basophils Absolute 07/13/2023 0.1  0.0 - 0.1 K/uL Final   Immature Granulocytes 07/13/2023 0  % Final   Abs Immature Granulocytes 07/13/2023 0.02  0.00 - 0.07 K/uL Final   Performed at [facility], [address], [place], [place] [address]   Color, Urine 07/13/2023 YELLOW  YELLOW Final   APPearance 07/13/2023 CLEAR  CLEAR Final   Specific Gravity, Urine 07/13/2023 1.021  1.005 - 1.030 Final   pH 07/13/2023 5.0  5.0 - 8.0 Final   Glucose, UA 07/13/2023 >=500 (A)  NEGATIVE mg/dL Final   Hgb urine dipstick 07/13/2023 SMALL (A)  NEGATIVE Final   Bilirubin Urine 07/13/2023 NEGATIVE  NEGATIVE Final   Ketones, ur 07/13/2023 NEGATIVE  NEGATIVE mg/dL Final   Protein, ur 96/81/7974 NEGATIVE  NEGATIVE mg/dL Final   Nitrite 96/81/7974 NEGATIVE  NEGATIVE Final   Leukocytes,Ua 07/13/2023 NEGATIVE  NEGATIVE Final   RBC / HPF 07/13/2023 11-20  0 - 5 RBC/hpf Final   WBC, UA 07/13/2023 0-5  0 - 5 WBC/hpf Final   Bacteria, UA 07/13/2023 NONE SEEN  NONE SEEN Final  Squamous Epithelial / HPF 07/13/2023 0-5  0 - 5 /HPF Final   Mucus 07/13/2023 PRESENT   Final   Performed at [facility], [address] [place], [place] [address]   Preg, Serum 07/13/2023 NEGATIVE  NEGATIVE Final   Comment:        THE SENSITIVITY OF THIS METHODOLOGY IS >10 mIU/mL. Performed at [facility], [address], [place], [place] [address]   Admission on 06/26/2023, Discharged on 06/26/2023  Component Date Value Ref Range Status   Glucose-Capillary 06/26/2023 145 (H)  70 - 99 mg/dL Final   Glucose reference range applies only to samples taken after fasting for at least 8 hours.  Admission on 06/21/2023, Discharged on 06/22/2023  Component Date Value Ref Range Status   Preg Test, Ur 06/21/2023 Negative  Negative Final   POC Amphetamine UR 06/21/2023 None Detected  NONE DETECTED (Cut Off Level 1000 ng/mL) Final   POC Secobarbital (BAR) 06/21/2023 None Detected  NONE DETECTED (Cut Off Level 300 ng/mL) Final   POC Buprenorphine (BUP) 06/21/2023 None Detected  NONE DETECTED (Cut Off Level 10 ng/mL) Final   POC Oxazepam (BZO) 06/21/2023 None Detected  NONE DETECTED (Cut Off  Level 300 ng/mL) Final   POC Cocaine UR 06/21/2023 None Detected  NONE DETECTED (Cut Off Level 300 ng/mL) Final   POC Methamphetamine UR 06/21/2023 None Detected  NONE DETECTED (Cut Off Level 1000 ng/mL) Final   POC Morphine  06/21/2023 None Detected  NONE DETECTED (Cut Off Level 300 ng/mL) Final   POC Methadone UR 06/21/2023 None Detected  NONE DETECTED (Cut Off Level 300 ng/mL) Final   POC Oxycodone  UR 06/21/2023 None Detected  NONE DETECTED (Cut Off Level 100 ng/mL) Final   POC Marijuana UR 06/21/2023 None Detected  NONE DETECTED (Cut Off Level 50 ng/mL) Final  Admission on 06/10/2023, Discharged on 06/10/2023  Component Date Value Ref Range Status   Lipase 06/10/2023 88 (H)  11 - 51 U/L Final   Performed at [facility], [address] [place], [place] [address]   Sodium 06/10/2023 137  135 - 145 mmol/L Final   Potassium 06/10/2023 3.8  3.5 - 5.1 mmol/L Final   Chloride 06/10/2023 105  98 - 111 mmol/L Final   CO2 06/10/2023 26  22 - 32 mmol/L Final   Glucose, Bld 06/10/2023 205 (H)  70 - 99 mg/dL Final   Glucose reference range applies only to samples taken after fasting for at least 8 hours.   BUN 06/10/2023 10  6 - 20 mg/dL Final   Creatinine, Ser 06/10/2023 0.89  0.44 - 1.00 mg/dL Final   Calcium  06/10/2023 9.0  8.9 - 10.3 mg/dL Final   Total Protein 97/86/7974 7.2  6.5 - 8.1 g/dL Final   Albumin 97/86/7974 4.4  3.5 - 5.0 g/dL Final   AST 97/86/7974 21  15 - 41 U/L Final   ALT 06/10/2023 20  0 - 44 U/L Final   Alkaline Phosphatase 06/10/2023 47  38 - 126 U/L Final   Total Bilirubin 06/10/2023 0.3  0.0 - 1.2 mg/dL Final   GFR, Estimated 06/10/2023 >60  >60 mL/min Final   Comment: (NOTE) Calculated using the CKD-EPI Creatinine Equation (2021)    Anion gap 06/10/2023 6  5 - 15 Final   Performed at [facility], [address] [place], [place] [address]   WBC 06/10/2023 10.1  4.0 - 10.5 K/uL Final   RBC 06/10/2023 4.55  3.87 - 5.11 MIL/uL  Final  Hemoglobin 06/10/2023 11.4 (L)  12.0 - 15.0 g/dL Final   HCT 97/86/7974 35.9 (L)  36.0 - 46.0 % Final   MCV 06/10/2023 78.9 (L)  80.0 - 100.0 fL Final   MCH 06/10/2023 25.1 (L)  26.0 - 34.0 pg Final   MCHC 06/10/2023 31.8  30.0 - 36.0 g/dL Final   RDW 97/86/7974 15.8 (H)  11.5 - 15.5 % Final   Platelets 06/10/2023 243  150 - 400 K/uL Final   Comment: SPECIMEN CHECKED FOR CLOTS REPEATED TO VERIFY    nRBC 06/10/2023 0.0  0.0 - 0.2 % Final   Performed at [facility], [address], [place], [place] [address]   Color, Urine 06/10/2023 COLORLESS (A)  YELLOW Final   APPearance 06/10/2023 CLEAR  CLEAR Final   Specific Gravity, Urine 06/10/2023 1.013  1.005 - 1.030 Final   pH 06/10/2023 5.5  5.0 - 8.0 Final   Glucose, UA 06/10/2023 NEGATIVE  NEGATIVE mg/dL Final   Hgb urine dipstick 06/10/2023 SMALL (A)  NEGATIVE Final   Bilirubin Urine 06/10/2023 NEGATIVE  NEGATIVE Final   Ketones, ur 06/10/2023 NEGATIVE  NEGATIVE mg/dL Final   Protein, ur 97/86/7974 NEGATIVE  NEGATIVE mg/dL Final   Nitrite 97/86/7974 NEGATIVE  NEGATIVE Final   Leukocytes,Ua 06/10/2023 NEGATIVE  NEGATIVE Final   RBC / HPF 06/10/2023 0-5  0 - 5 RBC/hpf Final   WBC, UA 06/10/2023 0-5  0 - 5 WBC/hpf Final   Bacteria, UA 06/10/2023 NONE SEEN  NONE SEEN Final   Squamous Epithelial / HPF 06/10/2023 0-5  0 - 5 /HPF Final   Mucus 06/10/2023 PRESENT   Final   Performed at [facility], [address] [place], [place] [address]   Preg Test, Ur 06/10/2023 NEGATIVE  NEGATIVE Final   Comment:        THE SENSITIVITY OF THIS METHODOLOGY IS >25 mIU/mL. Performed at [facility], [address] [place], [place] [address]   Admission on 05/25/2023, Discharged on 05/26/2023  Component Date Value Ref Range Status   Color, Urine 05/25/2023 STRAW (A)  YELLOW Final   APPearance 05/25/2023 CLEAR  CLEAR Final   Specific Gravity, Urine 05/25/2023 1.011  1.005 - 1.030  Final   pH 05/25/2023 6.0  5.0 - 8.0 Final   Glucose, UA 05/25/2023 >=500 (A)  NEGATIVE mg/dL Final   Hgb urine dipstick 05/25/2023 NEGATIVE  NEGATIVE Final   Bilirubin Urine 05/25/2023 NEGATIVE  NEGATIVE Final   Ketones, ur 05/25/2023 NEGATIVE  NEGATIVE mg/dL Final   Protein, ur 98/71/7974 NEGATIVE  NEGATIVE mg/dL Final   Nitrite 98/71/7974 NEGATIVE  NEGATIVE Final   Leukocytes,Ua 05/25/2023 NEGATIVE  NEGATIVE Final   RBC / HPF 05/25/2023 0-5  0 - 5 RBC/hpf Final   WBC, UA 05/25/2023 0-5  0 - 5 WBC/hpf Final   Bacteria, UA 05/25/2023 RARE (A)  NONE SEEN Final   Squamous Epithelial / HPF 05/25/2023 0-5  0 - 5 /HPF Final   Performed at [facility], [address], [place], [place] [address]    Allergies: Wellbutrin  [bupropion ], Omnipaque  [iohexol ], Penicillins, Atarax  [hydroxyzine ], Buspar  [buspirone ], Contrast media [iodinated contrast media], and Cymbalta  [duloxetine  hcl]  Medications:  Facility Ordered Medications  Medication   acetaminophen  (TYLENOL ) tablet 650 mg   alum & mag hydroxide-simeth (MAALOX/MYLANTA) 200-200-20 MG/5ML suspension 30 mL   magnesium  hydroxide (MILK OF MAGNESIA) suspension 30 mL   haloperidol  (HALDOL ) tablet 5 mg   And   diphenhydrAMINE  (BENADRYL ) capsule 50 mg   haloperidol   lactate (HALDOL ) injection 5 mg   And   diphenhydrAMINE  (BENADRYL ) injection 50 mg   And   LORazepam  (ATIVAN ) injection 2 mg   haloperidol  lactate (HALDOL ) injection 10 mg   And   diphenhydrAMINE  (BENADRYL ) injection 50 mg   And   LORazepam  (ATIVAN ) injection 2 mg   diphenhydrAMINE  (BENADRYL ) capsule 50 mg   traZODone  (DESYREL ) tablet 100 mg   divalproex  (DEPAKOTE  ER) 24 hr tablet 500 mg   [COMPLETED] OLANZapine  zydis (ZYPREXA ) disintegrating tablet 10 mg   gabapentin  (NEURONTIN ) capsule 200 mg   [START ON 11/17/2023] insulin  aspart (novoLOG ) injection 0-15 Units   insulin  glargine-yfgn (SEMGLEE ) injection 10 Units   [START ON 11/17/2023] amLODipine  (NORVASC )  tablet 10 mg   benztropine  (COGENTIN ) tablet 0.5 mg   PTA Medications  Medication Sig   TRESIBA FLEXTOUCH 100 UNIT/ML FlexTouch Pen Inject 20 Units into the skin at bedtime.   GLOBAL EASE INJECT PEN NEEDLES 32G X 4 MM MISC Inject 1 each into the skin as directed.   amLODipine  (NORVASC ) 10 MG tablet Take 1 tablet (10 mg total) by mouth daily.   atorvastatin  (LIPITOR) 20 MG tablet Take 1 tablet (20 mg total) by mouth at bedtime.   cloNIDine  (CATAPRES ) 0.1 MG tablet Take 1 tablet (0.1 mg total) by mouth daily.   metoprolol  tartrate (LOPRESSOR ) 25 MG tablet Take 1 tablet (25 mg total) by mouth 2 (two) times daily for 14 days.   haloperidol  (HALDOL ) 10 MG tablet Take 1 tablet (10 mg total) by mouth at bedtime for 14 days.   lithium  carbonate (ESKALITH ) 450 MG ER tablet Take 1 tablet (450 mg total) by mouth at bedtime. For mood stabilization   traZODone  (DESYREL ) 150 MG tablet Take 1 tablet (150 mg total) by mouth at bedtime.   venlafaxine  XR (EFFEXOR -XR) 150 MG 24 hr capsule Take 1 capsule (150 mg total) by mouth daily with breakfast.   glimepiride  (AMARYL ) 4 MG tablet Take 1 tablet (4 mg total) by mouth daily.   benztropine  (COGENTIN ) 0.5 MG tablet Take 1 tablet (0.5 mg total) by mouth 2 (two) times daily. For eps   gabapentin  (NEURONTIN ) 100 MG capsule Take 1 capsule (100 mg total) by mouth 3 (three) times daily. For agitation   diclofenac  Sodium (VOLTAREN ) 1 % GEL Apply 4 g topically 4 (four) times daily.   doxycycline  (VIBRAMYCIN ) 100 MG capsule Take 1 capsule (100 mg total) by mouth 2 (two) times daily. One po bid x 7 days   ondansetron  (ZOFRAN -ODT) 4 MG disintegrating tablet 4mg  ODT q4 hours prn nausea/vomit   loperamide  (IMODIUM ) 2 MG capsule Take 1 capsule (2 mg total) by mouth 4 (four) times daily as needed for diarrhea or loose stools.   clindamycin  (CLEOCIN ) 300 MG capsule Take 1 capsule (300 mg total) by mouth 3 (three) times daily. X 7 days   fluconazole  (DIFLUCAN ) 150 MG tablet Take 1  tablet (150 mg total) by mouth daily.      Medical Decision Making  Patient meets inpatient psychiatric treatment criteria   1 . Concern about STD in female without diagnosis, no known exposure high-risk sexual activity - GC/Chlamydia probe amp ([place])not at [facility]; Standing - HIV Antibody (routine testing w rflx); Standing - HIV4GL Save Tube; Standing - GC/Chlamydia probe amp ([place])not at [facility] - HIV Antibody (routine testing w rflx) - HIV4GL Save Tube  2.  Type 2 diabetes mellitus with diabetic nephropathy, with long-term current use of insulin  (HCC)-sliding scale,  - Restarted long-acting insulin   10 units at bedtime therapeutic interchange for Lantus   3. . Essential hypertension  4.  Bipolar affective disorder, current episode mixed, without psychotic features (HCC) (Primary) - Lithium  level - divalproex  (DEPAKOTE  ER) 24 hr tablet 500 mg - OLANZapine  zydis (ZYPREXA ) disintegrating tablet 10 mg EKG obtained to measure QTc interval, QTc is 448 and a one-time dose of Zyprexa  will confirm with ACT team tomorrow patient's active home psychiatric meds.  Unable to retrieve from pharmacy dispensed report  5.  Hypertension, unspecified type - amLODipine  (NORVASC ) tablet 10 mg daily  6. Generalized anxiety disorder - gabapentin  (NEURONTIN ) capsule 200 mg 3 times daily  Routine labs pending,    Recommendations  Based on my evaluation the patient does not appear to have an emergency medical condition. Patient recommended for inpatient psychiatric treatment there is no appropriate bed availability at present at [facility] patient has been faxed out.  Suzen Lesches, NP 11/16/23  6:58 PM

## 2023-11-16 NOTE — ED Notes (Signed)
 Patient observed/assessed in bed/chair resting quietly appearing in no distress and verbalizing no complaints at this time. Will continue to monitor.

## 2023-11-16 NOTE — Progress Notes (Signed)
 Inpatient Psychiatric Referral  Patient was recommended inpatient per Suzen Lesches, NP. There are no available beds at [facility], per [facility] Cherylynn Ernst, RN. Patient was referred to the following out of network facilities:  Destination  Service Provider Request Status Address Phone Fax  [facility]  Pending - Request Sent [address] [place] [address] [phone]  [facility]  Pending - Request Sent [address] [place] [address] [phone]  [facility]  Pending - Request Sent [address] [place] [address] [phone]  [facility]  Pending - Request Sent [address], [place] [address] [phone]  [facility]  Pending - Request Sent [address] [place] [address] [phone]  [facility]  Pending - Request Sent [address] [place] [phone]  [facility]  [facility] - Request Sent [address] [place] [address] [phone]     Situation ongoing, CSW to continue following and update chart as more information becomes available.   Harrie Sofia MSW, LCSWA 11/16/2023  7:22PM
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
## 2023-11-17 DIAGNOSIS — E1121 Type 2 diabetes mellitus with diabetic nephropathy: Secondary | ICD-10-CM

## 2023-11-17 DIAGNOSIS — Z794 Long term (current) use of insulin: Secondary | ICD-10-CM | POA: Diagnosis not present

## 2023-11-17 LAB — GC/CHLAMYDIA PROBE AMP (~~LOC~~) NOT AT ARMC
Chlamydia: NEGATIVE
Comment: NEGATIVE
Comment: NORMAL
Neisseria Gonorrhea: NEGATIVE

## 2023-11-17 LAB — GLUCOSE, CAPILLARY
Glucose-Capillary: 246 mg/dL — ABNORMAL HIGH (ref 70–99)
Glucose-Capillary: 178 mg/dL — ABNORMAL HIGH (ref 70–99)
Glucose-Capillary: 303 mg/dL — ABNORMAL HIGH (ref 70–99)
Glucose-Capillary: 252 mg/dL — ABNORMAL HIGH (ref 70–99)

## 2023-11-17 LAB — RPR: RPR Ser Ql: NONREACTIVE

## 2023-11-17 MED ORDER — IBUPROFEN 400 MG PO TABS: 400.0000 mg | ORAL_TABLET | Freq: Four times a day (QID) | ORAL | Status: DC | PRN

## 2023-11-17 MED ORDER — ACETAMINOPHEN 325 MG PO TABS
650.0000 mg | ORAL_TABLET | Freq: Four times a day (QID) | ORAL | Status: DC | PRN
  Filled 2023-11-17: qty 2

## 2023-11-17 NOTE — ED Notes (Signed)
 Nurse to Nurse report given to [facility] Nurse. Nurse says they will be grateful if [facility] staff calls them with ETA on pt in AM

## 2023-11-17 NOTE — ED Notes (Signed)
 Patient observed/assessed at bedside. Patient alert and oriented to self and location. Affect is flat. Patient denies pain and anxiety. He denies A/V/H. He denies having any thoughts/plan of self harm and harm towards others. Fluid and snack offered. Patient states that appetite has been good throughout the day.  Verbalizes no further complaints at this time. Will continue to monitor and support.

## 2023-11-17 NOTE — ED Notes (Signed)
 Patient alert & oriented x4. Denies intent to harm self or others when asked. Denies A/VH. Patient denies any physical complaints when asked. No acute distress noted. Scheduled medications administered with no complications. Support and encouragement provided. Patient observed in milieu. No inappropriate behaviors seen or reported. Routine safety checks conducted per facility protocol. Encouraged patient to notify staff if any thoughts of harm towards self or others arise. Patient verbalizes understanding and agreement.

## 2023-11-17 NOTE — ED Notes (Signed)
Pt is refusing vitals at this time. 

## 2023-11-17 NOTE — ED Provider Notes (Addendum)
 Behavioral Health Progress Note  Date and Time: 11/17/2023 1:39 PM Name: [name] MRN:  [id number]  Subjective:  11/17/23: Pt showed some hypersexual behavior after admission, but has been overall calm and cooperative in [facility] where she has no roommates. She was accepted to [facility] for inpatient treatment who was willing to accept patient today. Pt refused to go to [facility], saying that she would rather go home. She is refusing because she does not like that they discharge their patients via greyhound bus, and she wants better transport. She says that she wants the sheriff to bring her to [facility], and that if the sheriff doesn't bring her to [facility], then she wants to leave AMA and go home. She is continuing to endorse [facility]. Given that she would like to leave AMA where there is not a safe discharge plan, we IVCd her to ensure she gets the proper care she needs as a patient at high risk for suicide.   Diagnosis:  Final diagnoses:  Concern about STD in female without diagnosis  Type 2 diabetes mellitus with diabetic nephropathy, with long-term current use of insulin  (HCC)  Essential hypertension  Bipolar affective disorder, current episode mixed, without psychotic features (HCC)  Hypertension, unspecified type  Generalized anxiety disorder    Total Time spent with patient: 30 minutes  Past Psychiatric History: Schizoaffective disorder, schizophrenia, GAD, depression, and borderline intellectual functioning Past Medical History: HTN, HLD, Diabetes, Reflux,  Social History: Has her own apartment where she lives with her boyfriend.  Additional Social History:    Pain Medications: See MAR Prescriptions: See MAR Over the Counter: See MAR History of alcohol / drug use?: No history of alcohol / drug abuse Longest period of sobriety (when/how long): N/A Negative Consequences of Use:  (N/A) Withdrawal Symptoms:  (N/A)                    Sleep: Fair   Current Medications:  Current  Facility-Administered Medications  Medication Dose Route Frequency Provider Last Rate Last Admin   acetaminophen  (TYLENOL ) tablet 650 mg  650 mg Oral Q6H PRN Arloa Suzen RAMAN, NP       alum & mag hydroxide-simeth (MAALOX/MYLANTA) 200-200-20 MG/5ML suspension 30 mL  30 mL Oral Q4H PRN Arloa Suzen RAMAN, NP       amLODipine  (NORVASC ) tablet 10 mg  10 mg Oral Daily Arloa Suzen RAMAN, NP   10 mg at 11/17/23 9047   benztropine  (COGENTIN ) tablet 0.5 mg  0.5 mg Oral BID Arloa Suzen RAMAN, NP   0.5 mg at 11/17/23 9047   haloperidol  (HALDOL ) tablet 5 mg  5 mg Oral TID PRN Arloa Suzen RAMAN, NP       And   diphenhydrAMINE  (BENADRYL ) capsule 50 mg  50 mg Oral TID PRN Arloa Suzen RAMAN, NP       diphenhydrAMINE  (BENADRYL ) capsule 50 mg  50 mg Oral Q8H PRN Arloa Suzen RAMAN, NP       haloperidol  lactate (HALDOL ) injection 5 mg  5 mg Intramuscular TID PRN Arloa Suzen RAMAN, NP       And   diphenhydrAMINE  (BENADRYL ) injection 50 mg  50 mg Intramuscular TID PRN Arloa Suzen RAMAN, NP       And   LORazepam  (ATIVAN ) injection 2 mg  2 mg Intramuscular TID PRN Arloa Suzen RAMAN, NP       haloperidol  lactate (HALDOL ) injection 10 mg  10 mg Intramuscular TID PRN Arloa Suzen RAMAN, NP  And   diphenhydrAMINE  (BENADRYL ) injection 50 mg  50 mg Intramuscular TID PRN Arloa Suzen RAMAN, NP       And   LORazepam  (ATIVAN ) injection 2 mg  2 mg Intramuscular TID PRN Arloa Suzen RAMAN, NP       divalproex  (DEPAKOTE  ER) 24 hr tablet 500 mg  500 mg Oral QHS Arloa Suzen RAMAN, NP   500 mg at 11/16/23 1801   gabapentin  (NEURONTIN ) capsule 200 mg  200 mg Oral TID Arloa Suzen RAMAN, NP   200 mg at 11/17/23 9047   insulin  aspart (novoLOG ) injection 0-15 Units  0-15 Units Subcutaneous TID WC Arloa Suzen RAMAN, NP   3 Units at 11/17/23 1205   insulin  glargine-yfgn (SEMGLEE ) injection 10 Units  10 Units Subcutaneous QHS Arloa Suzen RAMAN, NP   10 Units at 11/16/23 2213   magnesium  hydroxide (MILK OF MAGNESIA)  suspension 30 mL  30 mL Oral Daily PRN Arloa Suzen RAMAN, NP       traZODone  (DESYREL ) tablet 100 mg  100 mg Oral QHS PRN Arloa Suzen RAMAN, NP   100 mg at 11/16/23 2213   Current Outpatient Medications  Medication Sig Dispense Refill   amLODipine  (NORVASC ) 10 MG tablet Take 1 tablet (10 mg total) by mouth daily. 14 tablet 0   atorvastatin  (LIPITOR) 20 MG tablet Take 1 tablet (20 mg total) by mouth at bedtime. 14 tablet 0   benztropine  (COGENTIN ) 0.5 MG tablet Take 1 tablet (0.5 mg total) by mouth 2 (two) times daily. For eps 28 tablet 0   cloNIDine  (CATAPRES ) 0.1 MG tablet Take 1 tablet (0.1 mg total) by mouth daily. 14 tablet 0   gabapentin  (NEURONTIN ) 100 MG capsule Take 1 capsule (100 mg total) by mouth 3 (three) times daily. For agitation 42 capsule 0   glimepiride  (AMARYL ) 4 MG tablet Take 1 tablet (4 mg total) by mouth daily. 14 tablet 0   GLOBAL EASE INJECT PEN NEEDLES 32G X 4 MM MISC Inject 1 each into the skin as directed.     haloperidol  (HALDOL ) 10 MG tablet Take 1 tablet (10 mg total) by mouth at bedtime for 14 days. 14 tablet    insulin  aspart protamine- aspart (NOVOLOG  MIX 70/30) (70-30) 100 UNIT/ML injection Inject 20 Units into the skin in the morning and at bedtime.     lithium  carbonate (ESKALITH ) 450 MG ER tablet Take 1 tablet (450 mg total) by mouth at bedtime. For mood stabilization 14 tablet 0   loperamide  (IMODIUM ) 2 MG capsule Take 1 capsule (2 mg total) by mouth 4 (four) times daily as needed for diarrhea or loose stools. 12 capsule 0   metoprolol  tartrate (LOPRESSOR ) 25 MG tablet Take 1 tablet (25 mg total) by mouth 2 (two) times daily for 14 days. 28 tablet 0   ondansetron  (ZOFRAN -ODT) 4 MG disintegrating tablet 4mg  ODT q4 hours prn nausea/vomit (Patient taking differently: Take 4 mg by mouth every 4 (four) hours as needed for nausea or vomiting.) 20 tablet 0   traZODone  (DESYREL ) 150 MG tablet Take 1 tablet (150 mg total) by mouth at bedtime. 14 tablet 0    venlafaxine  XR (EFFEXOR -XR) 150 MG 24 hr capsule Take 1 capsule (150 mg total) by mouth daily with breakfast. 14 capsule 0   diclofenac  Sodium (VOLTAREN ) 1 % GEL Apply 4 g topically 4 (four) times daily. (Patient not taking: Reported on 11/17/2023) 350 g 0    Labs  Lab Results:  Admission on 11/16/2023  Component Date Value  Ref Range Status   WBC 11/16/2023 11.1 (H)  4.0 - 10.5 K/uL Final   RBC 11/16/2023 4.74  3.87 - 5.11 MIL/uL Final   Hemoglobin 11/16/2023 12.4  12.0 - 15.0 g/dL Final   HCT 92/77/7974 38.5  36.0 - 46.0 % Final   MCV 11/16/2023 81.2  80.0 - 100.0 fL Final   MCH 11/16/2023 26.2  26.0 - 34.0 pg Final   MCHC 11/16/2023 32.2  30.0 - 36.0 g/dL Final   RDW 92/77/7974 14.6  11.5 - 15.5 % Final   Platelets 11/16/2023 229  150 - 400 K/uL Final   REPEATED TO VERIFY   nRBC 11/16/2023 0.0  0.0 - 0.2 % Final   Neutrophils Relative % 11/16/2023 59  % Final   Neutro Abs 11/16/2023 6.5  1.7 - 7.7 K/uL Final   Lymphocytes Relative 11/16/2023 31  % Final   Lymphs Abs 11/16/2023 3.5  0.7 - 4.0 K/uL Final   Monocytes Relative 11/16/2023 6  % Final   Monocytes Absolute 11/16/2023 0.7  0.1 - 1.0 K/uL Final   Eosinophils Relative 11/16/2023 3  % Final   Eosinophils Absolute 11/16/2023 0.3  0.0 - 0.5 K/uL Final   Basophils Relative 11/16/2023 1  % Final   Basophils Absolute 11/16/2023 0.1  0.0 - 0.1 K/uL Final   Immature Granulocytes 11/16/2023 0  % Final   Abs Immature Granulocytes 11/16/2023 0.03  0.00 - 0.07 K/uL Final   Performed at [facility], [address], [place], [place] [address]   Sodium 11/16/2023 138  135 - 145 mmol/L Final   Potassium 11/16/2023 4.1  3.5 - 5.1 mmol/L Final   Chloride 11/16/2023 105  98 - 111 mmol/L Final   CO2 11/16/2023 25  22 - 32 mmol/L Final   Glucose, Bld 11/16/2023 205 (H)  70 - 99 mg/dL Final   Glucose reference range applies only to samples taken after fasting for at least 8 hours.   BUN 11/16/2023 9  6 - 20 mg/dL Final   Creatinine,  Ser 11/16/2023 0.93  0.44 - 1.00 mg/dL Final   Calcium  11/16/2023 9.2  8.9 - 10.3 mg/dL Final   Total Protein 92/77/7974 7.0  6.5 - 8.1 g/dL Final   Albumin 92/77/7974 4.0  3.5 - 5.0 g/dL Final   AST 92/77/7974 20  15 - 41 U/L Final   ALT 11/16/2023 21  0 - 44 U/L Final   Alkaline Phosphatase 11/16/2023 48  38 - 126 U/L Final   Total Bilirubin 11/16/2023 0.6  0.0 - 1.2 mg/dL Final   GFR, Estimated 11/16/2023 >60  >60 mL/min Final   Comment: (NOTE) Calculated using the CKD-EPI Creatinine Equation (2021)    Anion gap 11/16/2023 8  5 - 15 Final   Performed at [facility], [address] [place], [place] [address]   Hgb A1c MFr Bld 11/16/2023 8.1 (H)  4.8 - 5.6 % Final   Comment: (NOTE) Diagnosis of Diabetes The following HbA1c ranges recommended by the American Diabetes Association (ADA) may be used as an aid in the diagnosis of diabetes mellitus.  Hemoglobin             Suggested A1C NGSP%              Diagnosis  <5.7                   Non Diabetic  5.7-6.4  Pre-Diabetic  >6.4                   Diabetic  <7.0                   Glycemic control for                       adults with diabetes.     Mean Plasma Glucose 11/16/2023 185.77  mg/dL Final   Performed at [facility], [address], [place], [place] [address]   Alcohol, Ethyl (B) 11/16/2023 <15  <15 mg/dL Final   Comment: (NOTE) For medical purposes only. Performed at [facility], [address] [place], [place] [address]    Cholesterol 11/16/2023 162  0 - 200 mg/dL Final   Triglycerides 92/77/7974 196 (H)  <150 mg/dL Final   HDL 92/77/7974 53  >40 mg/dL Final   Total CHOL/HDL Ratio 11/16/2023 3.1  RATIO Final   VLDL 11/16/2023 39  0 - 40 mg/dL Final   LDL Cholesterol 11/16/2023 70  0 - 99 mg/dL Final   Comment:        Total Cholesterol/HDL:CHD Risk Coronary Heart Disease Risk Table                     Men   Women  1/2 Average Risk   3.4   3.3  Average Risk       5.0   4.4   2 X Average Risk   9.6   7.1  3 X Average Risk  23.4   11.0        Use the calculated Patient Ratio above and the CHD Risk Table to determine the patient's CHD Risk.        ATP III CLASSIFICATION (LDL):  <100     mg/dL   Optimal  899-870  mg/dL   Near or Above                    Optimal  130-159  mg/dL   Borderline  839-810  mg/dL   High  >809     mg/dL   Very High Performed at [facility], [address] [place], [place] [address]    TSH 11/16/2023 1.351  0.350 - 4.500 uIU/mL Final   Comment: Performed by a 3rd Generation assay with a functional sensitivity of <=0.01 uIU/mL. Performed at [facility], [address] [place], [place] [address]    RPR Ser Ql 11/16/2023 NON REACTIVE  NON REACTIVE Final   Performed at [facility], [address] [place], [place] [address]   POC Amphetamine UR 11/16/2023 None Detected  NONE DETECTED (Cut Off Level 1000 ng/mL) Final   POC Secobarbital (BAR) 11/16/2023 None Detected  NONE DETECTED (Cut Off Level 300 ng/mL) Final   POC Buprenorphine (BUP) 11/16/2023 None Detected  NONE DETECTED (Cut Off Level 10 ng/mL) Final   POC Oxazepam (BZO) 11/16/2023 None Detected  NONE DETECTED (Cut Off Level 300 ng/mL) Final   POC Cocaine UR 11/16/2023 None Detected  NONE DETECTED (Cut Off Level 300 ng/mL) Final   POC Methamphetamine UR 11/16/2023 None Detected  NONE DETECTED (Cut Off Level 1000 ng/mL) Final   POC Morphine  11/16/2023 None Detected  NONE DETECTED (Cut Off Level 300 ng/mL) Final   POC Methadone UR 11/16/2023 None Detected  NONE DETECTED (Cut Off Level 300 ng/mL) Final   POC Oxycodone   UR 11/16/2023 None Detected  NONE DETECTED (Cut Off Level 100 ng/mL) Final   POC Marijuana UR 11/16/2023 None Detected  NONE DETECTED (Cut Off Level 50 ng/mL) Final   Color, Urine 11/16/2023 YELLOW  YELLOW Final   APPearance 11/16/2023 HAZY (A)  CLEAR Final   Specific Gravity, Urine 11/16/2023 1.023  1.005 - 1.030 Final   pH 11/16/2023 5.0  5.0 -  8.0 Final   Glucose, UA 11/16/2023 >=500 (A)  NEGATIVE mg/dL Final   Hgb urine dipstick 11/16/2023 NEGATIVE  NEGATIVE Final   Bilirubin Urine 11/16/2023 NEGATIVE  NEGATIVE Final   Ketones, ur 11/16/2023 NEGATIVE  NEGATIVE mg/dL Final   Protein, ur 92/77/7974 NEGATIVE  NEGATIVE mg/dL Final   Nitrite 92/77/7974 NEGATIVE  NEGATIVE Final   Leukocytes,Ua 11/16/2023 NEGATIVE  NEGATIVE Final   RBC / HPF 11/16/2023 0-5  0 - 5 RBC/hpf Final   WBC, UA 11/16/2023 0-5  0 - 5 WBC/hpf Final   Bacteria, UA 11/16/2023 NONE SEEN  NONE SEEN Final   Squamous Epithelial / HPF 11/16/2023 0-5  0 - 5 /HPF Final   Mucus 11/16/2023 PRESENT   Final   Performed at [facility], [address] [place], [place] [address]   HIV Screen 4th Generation wRfx 11/16/2023 Non Reactive  Non Reactive Final   Performed at [facility], [address] [place], [place] [address]   Lithium  Lvl 11/16/2023 0.13 (L)  0.60 - 1.20 mmol/L Final   Performed at [facility], [address] [place], [place] [address]   Glucose-Capillary 11/16/2023 228 (H)  70 - 99 mg/dL Final   Glucose reference range applies only to samples taken after fasting for at least 8 hours.   Glucose-Capillary 11/17/2023 246 (H)  70 - 99 mg/dL Final   Glucose reference range applies only to samples taken after fasting for at least 8 hours.   Glucose-Capillary 11/17/2023 178 (H)  70 - 99 mg/dL Final   Glucose reference range applies only to samples taken after fasting for at least 8 hours.  Admission on 10/30/2023, Discharged on 10/31/2023  Component Date Value Ref Range Status   Color, Urine 10/31/2023 YELLOW  YELLOW Final   APPearance 10/31/2023 CLEAR  CLEAR Final   Specific Gravity, Urine 10/31/2023 1.020  1.005 - 1.030 Final   pH 10/31/2023 5.0  5.0 - 8.0 Final   Glucose, UA 10/31/2023 >=500 (A)  NEGATIVE mg/dL Final   Hgb urine dipstick 10/31/2023 SMALL (A)  NEGATIVE Final   Bilirubin Urine 10/31/2023 NEGATIVE  NEGATIVE Final    Ketones, ur 10/31/2023 NEGATIVE  NEGATIVE mg/dL Final   Protein, ur 92/93/7974 30 (A)  NEGATIVE mg/dL Final   Nitrite 92/93/7974 NEGATIVE  NEGATIVE Final   Leukocytes,Ua 10/31/2023 MODERATE (A)  NEGATIVE Final   RBC / HPF 10/31/2023 0-5  0 - 5 RBC/hpf Final   WBC, UA 10/31/2023 6-10  0 - 5 WBC/hpf Final   Bacteria, UA 10/31/2023 RARE (A)  NONE SEEN Final   Squamous Epithelial / HPF 10/31/2023 0-5  0 - 5 /HPF Final   Mucus 10/31/2023 PRESENT   Final   Performed at [facility], [address], [place], [place] [address]   Yeast Wet Prep HPF POC 10/31/2023 NONE SEEN  NONE SEEN Final   Trich, Wet Prep 10/31/2023 PRESENT (A)  NONE SEEN Final   Clue Cells Wet Prep HPF POC 10/31/2023 PRESENT (A)  NONE SEEN Final   WBC, Wet Prep HPF POC 10/31/2023 >=10 (  A)  <10 Final   Sperm 10/31/2023 NONE SEEN   Final   Performed at [facility], [address] [place], [place] [address]   Neisseria Gonorrhea 10/31/2023 Negative   Final   Chlamydia 10/31/2023 Negative   Final   Comment 10/31/2023 Normal Reference Ranger Chlamydia - Negative   Final   Comment 10/31/2023 Normal Reference Range Neisseria Gonorrhea - Negative   Final   Preg Test, Ur 10/31/2023 NEGATIVE  NEGATIVE Final   Comment:        THE SENSITIVITY OF THIS METHODOLOGY IS >20 mIU/mL. Performed at [facility], [address], [place], [place] [address]   Admission on 10/24/2023, Discharged on 10/24/2023  Component Date Value Ref Range Status   Color, Urine 10/24/2023 YELLOW  YELLOW Final   APPearance 10/24/2023 CLEAR  CLEAR Final   Specific Gravity, Urine 10/24/2023 1.027  1.005 - 1.030 Final   pH 10/24/2023 5.5  5.0 - 8.0 Final   Glucose, UA 10/24/2023 >1,000 (A)  NEGATIVE mg/dL Final   Hgb urine dipstick 10/24/2023 NEGATIVE  NEGATIVE Final   Bilirubin Urine 10/24/2023 NEGATIVE  NEGATIVE Final   Ketones, ur 10/24/2023 NEGATIVE  NEGATIVE mg/dL Final   Protein, ur 93/70/7974  NEGATIVE  NEGATIVE mg/dL Final   Nitrite 93/70/7974 NEGATIVE  NEGATIVE Final   Leukocytes,Ua 10/24/2023 LARGE (A)  NEGATIVE Final   RBC / HPF 10/24/2023 11-20  0 - 5 RBC/hpf Final   WBC, UA 10/24/2023 >50  0 - 5 WBC/hpf Final   Bacteria, UA 10/24/2023 RARE (A)  NONE SEEN Final   Squamous Epithelial / HPF 10/24/2023 0-5  0 - 5 /HPF Final   Mucus 10/24/2023 PRESENT   Final   Performed at [facility], [address], [place], [place] [address]   Preg Test, Ur 10/24/2023 NEGATIVE  NEGATIVE Final   Comment:        THE SENSITIVITY OF THIS METHODOLOGY IS >20 mIU/mL. Performed at [facility], [address], [place], [place] [address]    Glucose-Capillary 10/24/2023 268 (H)  70 - 99 mg/dL Final   Glucose reference range applies only to samples taken after fasting for at least 8 hours.   Neisseria Gonorrhea 10/24/2023 Negative   Final   Chlamydia 10/24/2023 Negative   Final   Comment 10/24/2023 Normal Reference Ranger Chlamydia - Negative   Final   Comment 10/24/2023 Normal Reference Range Neisseria Gonorrhea - Negative   Final   Yeast Wet Prep HPF POC 10/24/2023 PRESENT (A)  NONE SEEN Final   Trich, Wet Prep 10/24/2023 NONE SEEN  NONE SEEN Final   Clue Cells Wet Prep HPF POC 10/24/2023 NONE SEEN  NONE SEEN Final   WBC, Wet Prep HPF POC 10/24/2023 <10  <10 Final   Swab received with less than 0.5 mL of saline, saline added to specimen, interpret results with caution.   Sperm 10/24/2023 NONE SEEN   Final   Performed at [facility], [address] [place], [place] [address]  Admission on 07/13/2023, Discharged on 07/13/2023  Component Date Value Ref Range Status   Sodium 07/13/2023 138  135 - 145 mmol/L Final   Potassium 07/13/2023 3.8  3.5 - 5.1 mmol/L Final   Chloride 07/13/2023 104  98 - 111 mmol/L Final   CO2 07/13/2023 25  22 - 32 mmol/L Final   Glucose, Bld 07/13/2023 247 (H)  70 - 99 mg/dL Final   Glucose reference range  applies only to samples taken after fasting  for at least 8 hours.   BUN 07/13/2023 8  6 - 20 mg/dL Final   Creatinine, Ser 07/13/2023 0.75  0.44 - 1.00 mg/dL Final   Calcium  07/13/2023 9.0  8.9 - 10.3 mg/dL Final   Total Protein 96/81/7974 7.6  6.5 - 8.1 g/dL Final   Albumin 96/81/7974 4.0  3.5 - 5.0 g/dL Final   AST 96/81/7974 24  15 - 41 U/L Final   ALT 07/13/2023 20  0 - 44 U/L Final   Alkaline Phosphatase 07/13/2023 54  38 - 126 U/L Final   Total Bilirubin 07/13/2023 0.4  0.0 - 1.2 mg/dL Final   GFR, Estimated 07/13/2023 >60  >60 mL/min Final   Comment: (NOTE) Calculated using the CKD-EPI Creatinine Equation (2021)    Anion gap 07/13/2023 9  5 - 15 Final   Performed at [facility], [address] [place], [place] [address]   Lipase 07/13/2023 150 (H)  11 - 51 U/L Final   Performed at [facility], [address] [place], [place] [address]   WBC 07/13/2023 8.1  4.0 - 10.5 K/uL Final   RBC 07/13/2023 4.54  3.87 - 5.11 MIL/uL Final   Hemoglobin 07/13/2023 11.2 (L)  12.0 - 15.0 g/dL Final   HCT 96/81/7974 37.3  36.0 - 46.0 % Final   MCV 07/13/2023 82.2  80.0 - 100.0 fL Final   MCH 07/13/2023 24.7 (L)  26.0 - 34.0 pg Final   MCHC 07/13/2023 30.0  30.0 - 36.0 g/dL Final   RDW 96/81/7974 14.6  11.5 - 15.5 % Final   Platelets 07/13/2023 247  150 - 400 K/uL Final   nRBC 07/13/2023 0.0  0.0 - 0.2 % Final   Neutrophils Relative % 07/13/2023 60  % Final   Neutro Abs 07/13/2023 5.0  1.7 - 7.7 K/uL Final   Lymphocytes Relative 07/13/2023 29  % Final   Lymphs Abs 07/13/2023 2.3  0.7 - 4.0 K/uL Final   Monocytes Relative 07/13/2023 6  % Final   Monocytes Absolute 07/13/2023 0.5  0.1 - 1.0 K/uL Final   Eosinophils Relative 07/13/2023 4  % Final   Eosinophils Absolute 07/13/2023 0.3  0.0 - 0.5 K/uL Final   Basophils Relative 07/13/2023 1  % Final   Basophils Absolute 07/13/2023 0.1  0.0 - 0.1 K/uL Final   Immature Granulocytes 07/13/2023 0  % Final    Abs Immature Granulocytes 07/13/2023 0.02  0.00 - 0.07 K/uL Final   Performed at [facility], [address], [place], [place] [address]   Color, Urine 07/13/2023 YELLOW  YELLOW Final   APPearance 07/13/2023 CLEAR  CLEAR Final   Specific Gravity, Urine 07/13/2023 1.021  1.005 - 1.030 Final   pH 07/13/2023 5.0  5.0 - 8.0 Final   Glucose, UA 07/13/2023 >=500 (A)  NEGATIVE mg/dL Final   Hgb urine dipstick 07/13/2023 SMALL (A)  NEGATIVE Final   Bilirubin Urine 07/13/2023 NEGATIVE  NEGATIVE Final   Ketones, ur 07/13/2023 NEGATIVE  NEGATIVE mg/dL Final   Protein, ur 96/81/7974 NEGATIVE  NEGATIVE mg/dL Final   Nitrite 96/81/7974 NEGATIVE  NEGATIVE Final   Leukocytes,Ua 07/13/2023 NEGATIVE  NEGATIVE Final   RBC / HPF 07/13/2023 11-20  0 - 5 RBC/hpf Final   WBC, UA 07/13/2023 0-5  0 - 5 WBC/hpf Final   Bacteria, UA 07/13/2023 NONE SEEN  NONE SEEN Final   Squamous Epithelial / HPF 07/13/2023 0-5  0 - 5 /HPF Final   Mucus 07/13/2023 PRESENT  Final   Performed at [facility], [address], [place], [place] [address]   Preg, Serum 07/13/2023 NEGATIVE  NEGATIVE Final   Comment:        THE SENSITIVITY OF THIS METHODOLOGY IS >10 mIU/mL. Performed at [facility], [address], [place], [place] [address]   Admission on 06/26/2023, Discharged on 06/26/2023  Component Date Value Ref Range Status   Glucose-Capillary 06/26/2023 145 (H)  70 - 99 mg/dL Final   Glucose reference range applies only to samples taken after fasting for at least 8 hours.  Admission on 06/21/2023, Discharged on 06/22/2023  Component Date Value Ref Range Status   Preg Test, Ur 06/21/2023 Negative  Negative Final   POC Amphetamine UR 06/21/2023 None Detected  NONE DETECTED (Cut Off Level 1000 ng/mL) Final   POC Secobarbital (BAR) 06/21/2023 None Detected  NONE DETECTED (Cut Off Level 300 ng/mL) Final   POC Buprenorphine (BUP) 06/21/2023 None Detected  NONE DETECTED  (Cut Off Level 10 ng/mL) Final   POC Oxazepam (BZO) 06/21/2023 None Detected  NONE DETECTED (Cut Off Level 300 ng/mL) Final   POC Cocaine UR 06/21/2023 None Detected  NONE DETECTED (Cut Off Level 300 ng/mL) Final   POC Methamphetamine UR 06/21/2023 None Detected  NONE DETECTED (Cut Off Level 1000 ng/mL) Final   POC Morphine  06/21/2023 None Detected  NONE DETECTED (Cut Off Level 300 ng/mL) Final   POC Methadone UR 06/21/2023 None Detected  NONE DETECTED (Cut Off Level 300 ng/mL) Final   POC Oxycodone  UR 06/21/2023 None Detected  NONE DETECTED (Cut Off Level 100 ng/mL) Final   POC Marijuana UR 06/21/2023 None Detected  NONE DETECTED (Cut Off Level 50 ng/mL) Final  Admission on 06/10/2023, Discharged on 06/10/2023  Component Date Value Ref Range Status   Lipase 06/10/2023 88 (H)  11 - 51 U/L Final   Performed at [facility], [address] [place], [place] [address]   Sodium 06/10/2023 137  135 - 145 mmol/L Final   Potassium 06/10/2023 3.8  3.5 - 5.1 mmol/L Final   Chloride 06/10/2023 105  98 - 111 mmol/L Final   CO2 06/10/2023 26  22 - 32 mmol/L Final   Glucose, Bld 06/10/2023 205 (H)  70 - 99 mg/dL Final   Glucose reference range applies only to samples taken after fasting for at least 8 hours.   BUN 06/10/2023 10  6 - 20 mg/dL Final   Creatinine, Ser 06/10/2023 0.89  0.44 - 1.00 mg/dL Final   Calcium  06/10/2023 9.0  8.9 - 10.3 mg/dL Final   Total Protein 97/86/7974 7.2  6.5 - 8.1 g/dL Final   Albumin 97/86/7974 4.4  3.5 - 5.0 g/dL Final   AST 97/86/7974 21  15 - 41 U/L Final   ALT 06/10/2023 20  0 - 44 U/L Final   Alkaline Phosphatase 06/10/2023 47  38 - 126 U/L Final   Total Bilirubin 06/10/2023 0.3  0.0 - 1.2 mg/dL Final   GFR, Estimated 06/10/2023 >60  >60 mL/min Final   Comment: (NOTE) Calculated using the CKD-EPI Creatinine Equation (2021)    Anion gap 06/10/2023 6  5 - 15 Final   Performed at [facility], [address]  [place], [place] [address]   WBC 06/10/2023 10.1  4.0 - 10.5 K/uL Final   RBC 06/10/2023 4.55  3.87 - 5.11 MIL/uL Final   Hemoglobin 06/10/2023 11.4 (L)  12.0 - 15.0 g/dL Final   HCT 97/86/7974 35.9 (L)  36.0 - 46.0 % Final   MCV 06/10/2023 78.9 (L)  80.0 - 100.0 fL Final   MCH 06/10/2023 25.1 (L)  26.0 - 34.0 pg Final   MCHC 06/10/2023 31.8  30.0 - 36.0 g/dL Final   RDW 97/86/7974 15.8 (H)  11.5 - 15.5 % Final   Platelets 06/10/2023 243  150 - 400 K/uL Final   Comment: SPECIMEN CHECKED FOR CLOTS REPEATED TO VERIFY    nRBC 06/10/2023 0.0  0.0 - 0.2 % Final   Performed at [facility], [address] [place], [place] [address]   Color, Urine 06/10/2023 COLORLESS (A)  YELLOW Final   APPearance 06/10/2023 CLEAR  CLEAR Final   Specific Gravity, Urine 06/10/2023 1.013  1.005 - 1.030 Final   pH 06/10/2023 5.5  5.0 - 8.0 Final   Glucose, UA 06/10/2023 NEGATIVE  NEGATIVE mg/dL Final   Hgb urine dipstick 06/10/2023 SMALL (A)  NEGATIVE Final   Bilirubin Urine 06/10/2023 NEGATIVE  NEGATIVE Final   Ketones, ur 06/10/2023 NEGATIVE  NEGATIVE mg/dL Final   Protein, ur 97/86/7974 NEGATIVE  NEGATIVE mg/dL Final   Nitrite 97/86/7974 NEGATIVE  NEGATIVE Final   Leukocytes,Ua 06/10/2023 NEGATIVE  NEGATIVE Final   RBC / HPF 06/10/2023 0-5  0 - 5 RBC/hpf Final   WBC, UA 06/10/2023 0-5  0 - 5 WBC/hpf Final   Bacteria, UA 06/10/2023 NONE SEEN  NONE SEEN Final   Squamous Epithelial / HPF 06/10/2023 0-5  0 - 5 /HPF Final   Mucus 06/10/2023 PRESENT   Final   Performed at [facility], [address] [place], [place] [address]   Preg Test, Ur 06/10/2023 NEGATIVE  NEGATIVE Final   Comment:        THE SENSITIVITY OF THIS METHODOLOGY IS >25 mIU/mL. Performed at [facility], [address] [place], [place] [address]   Admission on 05/25/2023, Discharged on 05/26/2023  Component Date Value Ref Range Status   Color, Urine 05/25/2023 STRAW (A)  YELLOW  Final   APPearance 05/25/2023 CLEAR  CLEAR Final   Specific Gravity, Urine 05/25/2023 1.011  1.005 - 1.030 Final   pH 05/25/2023 6.0  5.0 - 8.0 Final   Glucose, UA 05/25/2023 >=500 (A)  NEGATIVE mg/dL Final   Hgb urine dipstick 05/25/2023 NEGATIVE  NEGATIVE Final   Bilirubin Urine 05/25/2023 NEGATIVE  NEGATIVE Final   Ketones, ur 05/25/2023 NEGATIVE  NEGATIVE mg/dL Final   Protein, ur 98/71/7974 NEGATIVE  NEGATIVE mg/dL Final   Nitrite 98/71/7974 NEGATIVE  NEGATIVE Final   Leukocytes,Ua 05/25/2023 NEGATIVE  NEGATIVE Final   RBC / HPF 05/25/2023 0-5  0 - 5 RBC/hpf Final   WBC, UA 05/25/2023 0-5  0 - 5 WBC/hpf Final   Bacteria, UA 05/25/2023 RARE (A)  NONE SEEN Final   Squamous Epithelial / HPF 05/25/2023 0-5  0 - 5 /HPF Final   Performed at [facility], [address] [place], [place] [address]    Blood Alcohol level:  Lab Results  Component Value Date   [facility] <15 11/16/2023   ETH <10 04/12/2023    Metabolic Disorder Labs: Lab Results  Component Value Date   HGBA1C 8.1 (H) 11/16/2023   MPG 185.77 11/16/2023   MPG 197.25 04/12/2023   Lab Results  Component Value Date   PROLACTIN 26.0 09/28/2022   PROLACTIN 32.1 (H) 05/03/2020   Lab Results  Component Value Date   CHOL 162 11/16/2023   TRIG 196 (H) 11/16/2023   HDL 53 11/16/2023  CHOLHDL 3.1 11/16/2023   VLDL 39 11/16/2023   LDLCALC 70 11/16/2023   LDLCALC 101 (H) 09/28/2022    Therapeutic Lab Levels: Lab Results  Component Value Date   LITHIUM  0.13 (L) 11/16/2023   LITHIUM  0.10 (L) 04/12/2023   Lab Results  Component Value Date   VALPROATE <20 (L) 10/13/2021   VALPROATE <10 (L) 03/23/2021   No results found for: CBMZ  Physical Findings   AIMS    Flowsheet Row [facility]) [facility] Admission (Discharged) from OP Visit from 09/12/2019 [facility] Admission (Discharged) from OP Visit from  07/29/2019 [facility] Admission (Discharged) from OP Visit from 07/13/2019 [facility] Admission (Discharged) from 06/23/2019 [facility]  AIMS Total Score 0 0 0 0 0   AUDIT    Flowsheet Row Admission (Discharged) [facility] Admission (Discharged) from OP Visit from 09/12/2019 [facility] Admission (Discharged) from OP Visit from 07/29/2019 [facility] Admission (Discharged) from OP Visit from 07/13/2019 [facility] Admission (Discharged) from OP Visit from 06/11/2019 [facility]  Alcohol Use Disorder Identification Test Final Score (AUDIT) 0 0 0 4 3   GAD-7    Flowsheet Row Clinical Support [facility] Office Visit from 05/19/2018 in Center for [facility] Procedure visit from 03/18/2018 in Center for [facility] Office Visit from 01/12/2018 in Center for [facility] Procedure visit from 01/13/2017 in Center for [facility]  Total GAD-7 Score 1 6 7 5 1    PHQ2-9    Flowsheet Row ED from 07/03/2022 in [facility] ED from 04/09/2022 in [facility] ED from 01/10/2022 in [facility] ED from 10/24/2021 in [facility] Clinical Support from 07/05/2020 in [facility]  PHQ-2 Total Score 2 2 4 2 1   PHQ-9 Total Score 13 12 17 7 4    SBQ-R    Flowsheet Row Counselor from 10/17/2019 in [facility]  SBQ-R Total Score 13   Flowsheet Row ED from 11/16/2023 in [facility] ED from 10/30/2023 in [facility] Emergency Department at [facility] ED from 10/24/2023 in [facility] Emergency Department at [facility]  C-SSRS RISK CATEGORY High Risk No Risk No Risk     Musculoskeletal  Strength & Muscle Tone: within normal limits Gait & Station: normal Patient leans: N/A  Mental Status Exam   Mental Status Exam  Apperance: Appropriate for environment and Laying in bed Behavior: Calm and IRRITATED Speech: Normal Rate, Articulate, LOUD, and Responsive Attitude: Cooperative and DEFENSIVE Mood: not okay Affect: Euthymic, Normal Range, and Mood Congruent Perception: Not responding to internal stimuli Thought Content: within normal limits Thought Form: Goal Directed, Organized, and Linear Cognition: Alert & Oriented to person, place, and time, Recent and Remote memory grossly by recounting personal history, and Immediate memory grossly intact by interview Judgment: POOR Insight: POOR   Key Points: endorses SUICIDAL IDEATIONS w/ plan to overdose at home. Denies A&VH today    No Safety Checks orders active in given range  Nutritional Assessment (For OBS and [facility] admissions only) Has the patient had a weight loss or  gain of 10 pounds or more in the last 3 months?: No Has the patient had a decrease in food intake/or appetite?: No Does the patient have dental problems?: No Does the patient have eating habits or behaviors that may be indicators of an eating disorder including binging or inducing vomiting?: No Has the patient recently lost weight without trying?: 0    Physical Exam  Physical Exam Vitals reviewed.  Constitutional:      General: She is not in acute distress.    Appearance: She is not toxic-appearing.  Pulmonary:     Effort: Pulmonary effort is normal. No respiratory distress.  Neurological:     Mental Status: She is alert.    Review of Systems  Respiratory: Negative.    Cardiovascular: Negative.   Gastrointestinal: Negative.    Blood pressure 132/70, pulse 91, temperature 98.4 F  (36.9 C), resp. rate 18, SpO2 100%. There is no height or weight on file to calculate BMI.  Treatment Plan Summary: Daily contact with patient to assess and evaluate symptoms and progress in treatment  Patient meets inpatient psychiatric treatment criteria    1.  Bipolar affective disorder, current episode mixed, without psychotic features (HCC) (Primary) - Lithium  level: 0.13 - divalproex  (DEPAKOTE  ER) 24 hr tablet 500 mg - OLANZapine  zydis (ZYPREXA ) disintegrating tablet 10 mg (Qtc 448)  2 . Concern about STD in female without diagnosis, no known exposure high-risk sexual activity - GC/Chlamydia probe amp ([place])not at [facility]; Negative - HIV Antibody (routine testing w rflx); Negative   3.  Type 2 diabetes mellitus with diabetic nephropathy, with long-term current use of insulin  (HCC)-sliding scale,  - Restarted long-acting insulin  10 units at bedtime therapeutic interchange for Lantus    4. Essential hypertension  - amLODipine  (NORVASC ) tablet 10 mg daily  5. Generalized anxiety disorder - gabapentin  (NEURONTIN ) capsule 200 mg 3 times daily  Disposition - IVC paperwork processed and approved - Transport from [facility] to [facility].  Penne Mori, DO 11/17/2023 1:39 PM

## 2023-11-17 NOTE — Discharge Instructions (Signed)

## 2023-11-17 NOTE — ED Notes (Signed)
 Patient observed/assessed in bed/chair resting quietly appearing in no distress and verbalizing no complaints at this time. Will continue to monitor.

## 2023-11-17 NOTE — ED Notes (Signed)
 Patient resting in lounger with eyes closed, respirations even and unlabored. Patient in no apparent acute distress. Environment secured. Safety checks in place per facility protocol.

## 2023-11-17 NOTE — ED Notes (Signed)
 Patient calm and cooperative. Pt observed/assessed in Flex unit setting on recliner. RR even and unlabored, appearing in no noted distress. Environmental check complete

## 2023-11-18 DIAGNOSIS — F3160 Bipolar disorder, current episode mixed, unspecified: Secondary | ICD-10-CM | POA: Diagnosis not present

## 2023-11-18 LAB — GLUCOSE, CAPILLARY: Glucose-Capillary: 229 mg/dL — ABNORMAL HIGH (ref 70–99)

## 2023-11-18 NOTE — ED Notes (Signed)
 Patient observed/assessed in bed/chair resting quietly appearing in no distress and verbalizing no complaints at this time. Will continue to monitor.

## 2023-11-18 NOTE — ED Notes (Signed)
 Writer received call back from [facility]. Writer able to give updated CBG, V/S, and amount and type of sliding scale insulin  admininistered. [facility] RN then ended the call stating that was all they needed. Writer unable to provide report on updated behaviors including hypersexual behaviors observed on the milieu and patient statement regarding lying about SI to [facility] staff. Daily RN note has these updates listed in bold, note printed and included in transfer packet.

## 2023-11-18 NOTE — ED Notes (Signed)
 Patient calm and cooperative. Patient currently sleeping and resting in bed. Pt observed/assessed in recliner sleeping. RR even and unlabored, appearing in no noted distress. Environmental check complete

## 2023-11-18 NOTE — ED Provider Notes (Signed)
 [facility] OBS ASAP Discharge Summary  Date and Time: 11/18/2023 10:50 AM Name: [name] MRN:  [id number]   Subjective  HPI [name] is a 33 year old Female who arrived at [facility] by law enforcement on 11/16/2023 voluntarily for SI. Psychiatric history includes Bipolar I disorder, GAD. Pertinent medical history includes Type 2 diabetes and HTN. Patient has had multiple psychiatric admissions for SI and Mania at most psychiatric hospitals in the area with similar presentations to today. She has a history of multiple suicide attempts.  Pt has an ACT team and an outpt psychiatrist, but has not been taking her medications and reports that she is feeling suicidal. Since admission, she has also been demonstrating some mania symptoms such has being verbally hypersexual, labile mood, and increased energy. Pt was accepted to [facility], but demanded to go by sheriff or else she would go home and kill herself. She was IVCd shortly after, and transport was delayed 1 day because of this.  11/18/23 : Pt reports that she is ready to go home, says I was lying yesterday, I don't want to kill myself. When told that her IVC was going to be held given her history, she quickly became agitated, loud, and demanding, and starting pacing around the room in her frustration yelling, I was lying, I don't want to kill myself, I got kids! Given the history of poor insight and concern for ongoing hypomania, decided to hold the IVC until further evaluation can be done at [facility].  Final diagnoses:  Concern about STD in female without diagnosis  Type 2 diabetes mellitus with diabetic nephropathy, with long-term current use of insulin  (HCC)  Bipolar affective disorder, current episode mixed, without psychotic features (HCC)  Hypertension, unspecified type  Generalized anxiety disorder    Stay Summary: Pt arrived on 11/16/2023 and was discharged on 11/18/2023, with the one day delay due to transport. Pt has shown  some signs of mania, but has not required PRN medications for agitation or restraints during this time. She has self regulated well during her stay. She continues to be verbally hypersexual with comments toward others, and was taken for transport by sheriff this morning.  Family Psychiatric  History: none provided Social History: has her own apartment   Current Medications:  Current Facility-Administered Medications  Medication Dose Route Frequency Provider Last Rate Last Admin   acetaminophen  (TYLENOL ) tablet 650 mg  650 mg Oral Q6H PRN Lera Golas B, DO       alum & mag hydroxide-simeth (MAALOX/MYLANTA) 200-200-20 MG/5ML suspension 30 mL  30 mL Oral Q4H PRN Arloa Suzen RAMAN, NP       amLODipine  (NORVASC ) tablet 10 mg  10 mg Oral Daily Arloa Suzen RAMAN, NP   10 mg at 11/17/23 9047   benztropine  (COGENTIN ) tablet 0.5 mg  0.5 mg Oral BID Arloa Suzen RAMAN, NP   0.5 mg at 11/17/23 2130   haloperidol  (HALDOL ) tablet 5 mg  5 mg Oral TID PRN Arloa Suzen RAMAN, NP       And   diphenhydrAMINE  (BENADRYL ) capsule 50 mg  50 mg Oral TID PRN Arloa Suzen RAMAN, NP       diphenhydrAMINE  (BENADRYL ) capsule 50 mg  50 mg Oral Q8H PRN Arloa Suzen RAMAN, NP       haloperidol  lactate (HALDOL ) injection 5 mg  5 mg Intramuscular TID PRN Arloa Suzen RAMAN, NP       And   diphenhydrAMINE  (BENADRYL ) injection 50 mg  50 mg  Intramuscular TID PRN Arloa Suzen RAMAN, NP       And   LORazepam  (ATIVAN ) injection 2 mg  2 mg Intramuscular TID PRN Arloa Suzen RAMAN, NP       haloperidol  lactate (HALDOL ) injection 10 mg  10 mg Intramuscular TID PRN Arloa Suzen RAMAN, NP       And   diphenhydrAMINE  (BENADRYL ) injection 50 mg  50 mg Intramuscular TID PRN Arloa Suzen RAMAN, NP       And   LORazepam  (ATIVAN ) injection 2 mg  2 mg Intramuscular TID PRN Arloa Suzen RAMAN, NP       divalproex  (DEPAKOTE  ER) 24 hr tablet 500 mg  500 mg Oral QHS Arloa Suzen RAMAN, NP   500 mg at 11/17/23 2130   gabapentin  (NEURONTIN )  capsule 200 mg  200 mg Oral TID Arloa Suzen RAMAN, NP   200 mg at 11/17/23 2130   ibuprofen  (ADVIL ) tablet 400 mg  400 mg Oral Q6H PRN Lera Golas B, DO       insulin  aspart (novoLOG ) injection 0-15 Units  0-15 Units Subcutaneous TID WC Arloa Suzen RAMAN, NP   5 Units at 11/18/23 9148   insulin  glargine-yfgn (SEMGLEE ) injection 10 Units  10 Units Subcutaneous QHS Arloa Suzen RAMAN, NP   10 Units at 11/17/23 2138   magnesium  hydroxide (MILK OF MAGNESIA) suspension 30 mL  30 mL Oral Daily PRN Arloa Suzen RAMAN, NP       traZODone  (DESYREL ) tablet 100 mg  100 mg Oral QHS PRN Arloa Suzen RAMAN, NP   100 mg at 11/16/23 2213   Current Outpatient Medications  Medication Sig Dispense Refill   amLODipine  (NORVASC ) 10 MG tablet Take 1 tablet (10 mg total) by mouth daily. 14 tablet 0   atorvastatin  (LIPITOR) 20 MG tablet Take 1 tablet (20 mg total) by mouth at bedtime. 14 tablet 0   benztropine  (COGENTIN ) 0.5 MG tablet Take 1 tablet (0.5 mg total) by mouth 2 (two) times daily. For eps 28 tablet 0   cloNIDine  (CATAPRES ) 0.1 MG tablet Take 1 tablet (0.1 mg total) by mouth daily. 14 tablet 0   gabapentin  (NEURONTIN ) 100 MG capsule Take 1 capsule (100 mg total) by mouth 3 (three) times daily. For agitation 42 capsule 0   glimepiride  (AMARYL ) 4 MG tablet Take 1 tablet (4 mg total) by mouth daily. 14 tablet 0   GLOBAL EASE INJECT PEN NEEDLES 32G X 4 MM MISC Inject 1 each into the skin as directed.     haloperidol  (HALDOL ) 10 MG tablet Take 1 tablet (10 mg total) by mouth at bedtime for 14 days. 14 tablet    insulin  aspart protamine- aspart (NOVOLOG  MIX 70/30) (70-30) 100 UNIT/ML injection Inject 20 Units into the skin in the morning and at bedtime.     lithium  carbonate (ESKALITH ) 450 MG ER tablet Take 1 tablet (450 mg total) by mouth at bedtime. For mood stabilization 14 tablet 0   loperamide  (IMODIUM ) 2 MG capsule Take 1 capsule (2 mg total) by mouth 4 (four) times daily as needed for diarrhea or loose  stools. 12 capsule 0   metoprolol  tartrate (LOPRESSOR ) 25 MG tablet Take 1 tablet (25 mg total) by mouth 2 (two) times daily for 14 days. 28 tablet 0   ondansetron  (ZOFRAN -ODT) 4 MG disintegrating tablet 4mg  ODT q4 hours prn nausea/vomit (Patient taking differently: Take 4 mg by mouth every 4 (four) hours as needed for nausea or vomiting.) 20 tablet 0   traZODone  (DESYREL )  150 MG tablet Take 1 tablet (150 mg total) by mouth at bedtime. 14 tablet 0   venlafaxine  XR (EFFEXOR -XR) 150 MG 24 hr capsule Take 1 capsule (150 mg total) by mouth daily with breakfast. 14 capsule 0   diclofenac  Sodium (VOLTAREN ) 1 % GEL Apply 4 g topically 4 (four) times daily. (Patient not taking: Reported on 11/17/2023) 350 g 0    Objective  Labs  Labs have been reviewed and show: Lithium : 0.13 GC/Chlamydia: negative HIV antibody: negative Glucose: High 303, Low 205. Most recent 229 A1c 8.1 UA: Glucose >500  Normal/nonconcerning: CBC, Lipid Panel, TSH, RPR,  Negative: Ethanol  Qtc: 448  Monitoring Reports   AIMS    Flowsheet Row Ad[facility]) [facility] Admission (Discharged) from OP Visit from 09/12/2019 [facility] Admission (Discharged) from OP Visit from 07/29/2019 [facility] Admission (Discharged) from OP Visit from 07/13/2019 in [facility] Admission (Discharged) from 06/23/2019 in [facility]  AIMS Total Score 0 0 0 0 0   AUDIT    Flowsheet Row Admission (Discharged) [facility] Admission (Discharged) from OP Visit from 09/12/2019 [facility] Admission (Discharged) from OP Visit from 07/29/2019 in [facility] Admission (Discharged) from OP Visit from 07/13/2019 in [facility] Admission (Discharged) from OP Visit from 06/11/2019 in  [facility]  Alcohol Use Disorder Identification Test Final Score (AUDIT) 0 0 0 4 3   GAD-7    Flowsheet Row Clinical Support from 07/05/2020 in Center for [facility] Office Visit from 05/19/2018 in Center for [facility] Procedure visit from 03/18/2018 in Center for [facility] Office Visit from 01/12/2018 in Center for [facility] Procedure visit from 01/13/2017 in Center for [facility]  Total GAD-7 Score 1 6 7 5 1    PHQ2-9    Flowsheet Row ED from 07/03/2022 in [facility] ED from 04/09/2022 in [facility] ED from 01/10/2022 in [facility] ED from 10/24/2021 in [facility] Clinical Support from 07/05/2020 in [facility]  PHQ-2 Total Score 2 2 4 2 1   PHQ-9 Total Score 13 12 17 7 4    SBQ-R    Flowsheet Row Counselor from 10/17/2019 in [facility]  SBQ-R Total Score 13   Flowsheet Row ED from 11/16/2023 in [facility] ED from 10/30/2023 in [facility] Emergency Department at [facility] ED from 10/24/2023 in [facility] Emergency Department at [facility]  C-SSRS RISK CATEGORY High Risk No Risk No Risk      Physical Exam  Blood pressure 136/75, pulse 90, temperature 98.9 F (37.2 C), resp. rate 18, SpO2 100%. There is no height or weight on file to calculate BMI.  Physical Exam Vitals reviewed.  Constitutional:      General: She is not in acute distress.    Appearance: She is not toxic-appearing.  Pulmonary:     Effort: Pulmonary effort is normal. No respiratory distress.  Neurological:     Mental Status: She is alert.     Review of Systems  Respiratory: Negative.    Cardiovascular: Negative.   Gastrointestinal:  Negative.   Psychiatric/Behavioral:  Negative for suicidal ideas.  Mental Status Exam  Apperance: Appropriate for environment and Sitting upright Behavior: IRRITATED Speech: PRESSURED, Articulate, LOUD, and TALKATIVE Attitude: Cooperative and DEFENSIVE Mood: I'm fine Affect: LABILE, TEARFUL, Mood INCONGRUENT, and ANGRY Perception: Not responding to internal stimuli Thought Content: within normal limits Thought Form: Goal Directed, Organized, Linear, and Logical Cognition: Alert & Oriented to person, place, and time and Immediate memory grossly intact by interview Judgment: POOR Insight: POOR  Reliability: Reason to fake improved symptoms for secondary gain of being able to go home to her apartment, which she wanted someone to check on  Key Points: Denies SI, HI, A&VH   Assessment & Plan  Suicide Risk Assessment Demographic Factors:  Low socioeconomic status  Loss Factors: Decline in physical health  Historical Factors: Prior suicide attempts and Impulsivity  Risk Reduction Factors:   NA  Continued Clinical Symptoms:  More than one psychiatric diagnosis Previous Psychiatric Diagnoses and Treatments Medical Diagnoses and Treatments/Surgeries  Cognitive Features That Contribute To Risk:  Closed-mindedness    Suicide Risk:  Severe:  Frequent, intense, and enduring suicidal ideation, evidence of impaired self-control, severe dysphoria/symptomatology, multiple risk factors present, and few if any protective factors.   Clinical Impression Final diagnoses:  Concern about STD in female without diagnosis  Type 2 diabetes mellitus with diabetic nephropathy, with long-term current use of insulin  (HCC)  Bipolar affective disorder, current episode mixed, without psychotic features (HCC)  Hypertension, unspecified type  Generalized anxiety disorder    Daily contact with patient to assess and evaluate symptoms and progress in treatment and Medication  management  1.  Bipolar affective disorder, current episode mixed, without psychotic features (HCC) (Primary) - Lithium  level: 0.13 - divalproex  (DEPAKOTE  ER) 24 hr tablet 500 mg - OLANZapine  zydis (ZYPREXA ) disintegrating tablet 10 mg (Qtc 448)   2 . Concern about STD in female without diagnosis, no known exposure high-risk sexual activity - GC/Chlamydia probe amp ([place])not at [facility]; Negative - HIV Antibody (routine testing w rflx); Negative   3.  Type 2 diabetes mellitus with diabetic nephropathy, with long-term current use of insulin  (HCC)-sliding scale,  - Restarted long-acting insulin  10 units at bedtime therapeutic interchange for Lantus    4. Essential hypertension  - amLODipine  (NORVASC ) tablet 10 mg daily   5. Generalized anxiety disorder - gabapentin  (NEURONTIN ) capsule 200 mg 3 times daily   Disposition - IVC paperwork processed and approved - Sheriff picked patient up this morning for [facility]  Penne Mori, DO PGY-1  Psychiatry  11/18/2023 10:50 AM

## 2023-11-18 NOTE — ED Notes (Signed)
 Patient transferred to [facility]. Patient stable and ambulatory upon discharge. Patient belongings given to sheriff. Staff escorted to back [place] for transport. Safety maintained.

## 2023-11-18 NOTE — ED Notes (Signed)
 Patient resting in lounger with eyes closed, respirations even and unlabored. Patient in no apparent acute distress. Environment secured. Safety checks in place per facility protocol.

## 2023-11-18 NOTE — ED Notes (Signed)
 Patient alert & oriented x4. Denies intent to harm self or others when asked. Denies A/VH. Patient denies any physical complaints when asked. No acute distress noted. Patient in upset in regards to being transferred to [facility]. Patient states she needs to get home due to her boyfriend having access to her home. Patient stated I lied about the suicidal thoughts. I just wanted a place to stay. I'm just going to lie to them [facility]] when I get there and tell them I've never had any suicidal thoughts.  Support and encouragement provided. Patient observed in milieu. Patient has been hypersexual on the unit, frequently discussing sex and needing redirection. Patient states you just want everyone to be all sad and depressed and flat when writer informs patient we cannot discuss sex with female peer in milieu. Routine safety checks conducted per facility protocol. Encouraged patient to notify staff if any thoughts of harm towards self or others arise. Patient verbalizes understanding and agreement.

## 2023-12-06 ENCOUNTER — Other Ambulatory Visit: Payer: Self-pay

## 2023-12-06 ENCOUNTER — Emergency Department (HOSPITAL_COMMUNITY)
Admission: EM | Admit: 2023-12-06 | Discharge: 2023-12-06 | Disposition: A | Discharge status: Home / Self Care | Payer: MEDICAID

## 2023-12-06 ENCOUNTER — Encounter (HOSPITAL_COMMUNITY): Payer: Self-pay

## 2023-12-06 DIAGNOSIS — Z7984 Long term (current) use of oral hypoglycemic drugs: Secondary | ICD-10-CM | POA: Insufficient documentation

## 2023-12-06 DIAGNOSIS — R739 Hyperglycemia, unspecified: Secondary | ICD-10-CM

## 2023-12-06 DIAGNOSIS — Z5329 Procedure and treatment not carried out because of patient's decision for other reasons: Secondary | ICD-10-CM | POA: Insufficient documentation

## 2023-12-06 DIAGNOSIS — E1165 Type 2 diabetes mellitus with hyperglycemia: Secondary | ICD-10-CM | POA: Diagnosis not present

## 2023-12-06 DIAGNOSIS — N898 Other specified noninflammatory disorders of vagina: Secondary | ICD-10-CM | POA: Diagnosis present

## 2023-12-06 DIAGNOSIS — Z794 Long term (current) use of insulin: Secondary | ICD-10-CM | POA: Diagnosis not present

## 2023-12-06 LAB — URINALYSIS, ROUTINE W REFLEX MICROSCOPIC
Bacteria, UA: NONE SEEN
Bilirubin Urine: NEGATIVE
Glucose, UA: >=500 mg/dL — AB
Hgb urine dipstick: NEGATIVE
Ketones, ur: 5 mg/dL — AB
Leukocytes,Ua: NEGATIVE
Nitrite: NEGATIVE
Protein, ur: NEGATIVE mg/dL
Specific Gravity, Urine: 1.035 — ABNORMAL HIGH (ref 1.005–1.030)
pH: 5 (ref 5.0–8.0)

## 2023-12-06 LAB — WET PREP, GENITAL
Clue Cells Wet Prep HPF POC: NONE SEEN
Sperm: NONE SEEN
Trich, Wet Prep: NONE SEEN
WBC, Wet Prep HPF POC: <10 (ref <10)
Yeast Wet Prep HPF POC: NONE SEEN

## 2023-12-06 LAB — CBG MONITORING, ED: Glucose-Capillary: 411 mg/dL — ABNORMAL HIGH (ref 70–99)

## 2023-12-06 LAB — PREGNANCY, URINE: Preg Test, Ur: NEGATIVE

## 2023-12-06 MED ORDER — INSULIN ASPART 100 UNIT/ML IJ SOLN
10.0000 [IU] | Freq: Once | INTRAMUSCULAR | Status: AC
  Administered 2023-12-06 (×2): 10 [IU] via SUBCUTANEOUS
  Filled 2023-12-06: qty 0.1

## 2023-12-06 NOTE — ED Notes (Signed)
 Pt refuses d/c vitals, cbg.

## 2023-12-06 NOTE — ED Provider Notes (Signed)
 [place] EMERGENCY DEPARTMENT AT [facility] Provider Note   CSN: [id number] Arrival date & time: 12/06/23  9281     Patient presents with: Vaginal Itching   [name] is a 33 y.o. female.  Patient with past medical history of diabetes, bipolar 1 presents to emergency room with complaint of 1 month of vaginal itching.  Patient reports that she was recently diagnosed with trichomonas and she wants to make sure that she does not still have it.  She denies any suprapubic pain, or urgency or dysuria.  Patient also notes that her blood sugar has been high, has not taken medication today. She is currently eating.    Vaginal Itching       Prior to Admission medications   Medication Sig Start Date End Date Taking? Authorizing Provider  amLODipine  (NORVASC ) 10 MG tablet Take 1 tablet (10 mg total) by mouth daily. 04/13/23   Rollene Katz, MD  atorvastatin  (LIPITOR) 20 MG tablet Take 1 tablet (20 mg total) by mouth at bedtime. 04/13/23   Rollene Katz, MD  benztropine  (COGENTIN ) 0.5 MG tablet Take 1 tablet (0.5 mg total) by mouth 2 (two) times daily. For eps 04/13/23   Rollene Katz, MD  cloNIDine  (CATAPRES ) 0.1 MG tablet Take 1 tablet (0.1 mg total) by mouth daily. 04/13/23   Rollene Katz, MD  diclofenac  Sodium (VOLTAREN ) 1 % GEL Apply 4 g topically 4 (four) times daily. Patient not taking: Reported on 11/17/2023 04/13/23   Rollene Katz, MD  gabapentin  (NEURONTIN ) 100 MG capsule Take 1 capsule (100 mg total) by mouth 3 (three) times daily. For agitation 04/13/23   Rollene Katz, MD  glimepiride  (AMARYL ) 4 MG tablet Take 1 tablet (4 mg total) by mouth daily. 04/13/23   Rollene Katz, MD  GLOBAL EASE INJECT PEN NEEDLES 32G X 4 MM MISC Inject 1 each into the skin as directed. 10/20/22   [provider]  haloperidol  (HALDOL ) 10 MG tablet Take 1 tablet (10 mg total) by mouth at bedtime for 14 days. 04/13/23 11/17/23  Rollene Katz,  MD  insulin  aspart protamine- aspart (NOVOLOG  MIX 70/30) (70-30) 100 UNIT/ML injection Inject 20 Units into the skin in the morning and at bedtime.    [provider]  lithium  carbonate (ESKALITH ) 450 MG ER tablet Take 1 tablet (450 mg total) by mouth at bedtime. For mood stabilization 04/13/23   Rollene Katz, MD  loperamide  (IMODIUM ) 2 MG capsule Take 1 capsule (2 mg total) by mouth 4 (four) times daily as needed for diarrhea or loose stools. 09/10/23   Emil Share, DO  metoprolol  tartrate (LOPRESSOR ) 25 MG tablet Take 1 tablet (25 mg total) by mouth 2 (two) times daily for 14 days. 04/13/23 11/17/23  Rollene Katz, MD  ondansetron  (ZOFRAN -ODT) 4 MG disintegrating tablet 4mg  ODT q4 hours prn nausea/vomit Patient taking differently: Take 4 mg by mouth every 4 (four) hours as needed for nausea or vomiting. 09/10/23   Emil Share, DO  traZODone  (DESYREL ) 150 MG tablet Take 1 tablet (150 mg total) by mouth at bedtime. 04/13/23   Rollene Katz, MD  venlafaxine  XR (EFFEXOR -XR) 150 MG 24 hr capsule Take 1 capsule (150 mg total) by mouth daily with breakfast. 04/13/23   Rollene Katz, MD    Allergies: Wellbutrin  [bupropion ], Omnipaque  [iohexol ], Penicillins, Atarax  [hydroxyzine ], Buspar  [buspirone ], Contrast media [iodinated contrast media], and Cymbalta  [duloxetine  hcl]    Review of Systems  Genitourinary:  Positive for vaginal discharge.    Updated Vital Signs BP ROLLEN)  154/87 (BP Location: Left Arm)   Pulse (!) 110   Temp 98.2 F (36.8 C) (Oral)   Resp 16   Ht 5' 7 (1.702 m)   Wt 122 kg   SpO2 98%   BMI 42.13 kg/m   Physical Exam Vitals and nursing note reviewed.  Constitutional:      General: She is not in acute distress.    Appearance: She is not toxic-appearing.  HENT:     Head: Normocephalic and atraumatic.  Eyes:     General: No scleral icterus.    Conjunctiva/sclera: Conjunctivae normal.  Cardiovascular:     Rate and Rhythm: Normal rate and regular  rhythm.     Pulses: Normal pulses.     Heart sounds: Normal heart sounds.  Pulmonary:     Effort: Pulmonary effort is normal. No respiratory distress.     Breath sounds: Normal breath sounds.  Abdominal:     General: Abdomen is flat. Bowel sounds are normal.     Palpations: Abdomen is soft.     Tenderness: There is no abdominal tenderness.  Skin:    General: Skin is warm and dry.     Findings: No lesion.  Neurological:     General: No focal deficit present.     Mental Status: She is alert and oriented to person, place, and time. Mental status is at baseline.     (all labs ordered are listed, but only abnormal results are displayed) Labs Reviewed  URINALYSIS, ROUTINE W REFLEX MICROSCOPIC - Abnormal; Notable for the following components:      Result Value   APPearance HAZY (*)    Specific Gravity, Urine 1.035 (*)    Glucose, UA >=500 (*)    Ketones, ur 5 (*)    All other components within normal limits  CBG MONITORING, ED - Abnormal; Notable for the following components:   Glucose-Capillary 411 (*)    All other components within normal limits  WET PREP, GENITAL  PREGNANCY, URINE  BASIC METABOLIC PANEL WITH GFR  GC/CHLAMYDIA PROBE AMP ([facility]) NOT AT [facility]    EKG: None  Radiology: No results found.   Procedures   Medications Ordered in the ED  insulin  aspart (novoLOG ) injection 10 Units (has no administration in time range)                                    Medical Decision Making Amount and/or Complexity of Data Reviewed Labs: ordered.  Risk Prescription drug management.   This patient presents to the ED for concern of vaginal itching, this involves an extensive number of treatment options, and is a complaint that carries with it a high risk of complications and morbidity.  The differential diagnosis includes STD, yeast infection, urinary tract infection, DKA   Lab Tests:  I personally interpreted labs.  The pertinent results include:   Wet prep  negative. G&C pending. Urine pregnancy.  CBG 411 - refused bmp/ further labs to rule out DKA   Problem List / ED Course / Critical interventions / Medication management  Patient reporting to emergency room with complaint of vaginal itching.  Patient does not want pelvic exam and would like to self swab.  She is concerned she still has trichomonas.  She is sexually active. Wet prep negative, UA and urine preg obtained. G&C pending. Does not want empirically treated for G&C. Patient CBG 411 and does have small ketones in  urine. Patient refusing labs and wants to go thus can't rule out DKA/HHS. Given home dose of insulin  as patient is diabetic and has not taken medication today. Of note she is currently eating in room at time CBG was checked.  I ordered medication including given 10 units of insulin  subcu. Reevaluation of the patient after these medicines showed that the patient improved I have reviewed the patients home medicines and have made adjustments as needed Patient signed out AMA as refusing labs to rule out DKA.       Final diagnoses:  Vaginal itching  Hyperglycemia    ED Discharge Orders     None          Shermon Warren SAILOR, PA-C 12/06/23 0846    Neysa Caron PARAS, DO 12/06/23 1548

## 2023-12-06 NOTE — ED Triage Notes (Signed)
 Pt states that her vagina has been itching and burning for 1 month. She states that she probable still has the disease she had (trich).

## 2023-12-06 NOTE — ED Notes (Signed)
 Pt refuses blood work.

## 2023-12-06 NOTE — Discharge Instructions (Signed)
 Please make sure you are staying well hydrated. Take insulin  at home. Check back in with PCP. Return to ER for further workup.

## 2023-12-07 LAB — GC/CHLAMYDIA PROBE AMP (~~LOC~~) NOT AT ARMC
Chlamydia: NEGATIVE
Comment: NEGATIVE
Comment: NORMAL
Neisseria Gonorrhea: NEGATIVE

## 2023-12-10 ENCOUNTER — Emergency Department (HOSPITAL_COMMUNITY)
Admission: EM | Admit: 2023-12-10 | Discharge: 2023-12-11 | Disposition: A | Discharge status: Home / Self Care | Payer: MEDICAID | Attending: Emergency Medicine | Admitting: Emergency Medicine

## 2023-12-10 ENCOUNTER — Encounter (HOSPITAL_COMMUNITY): Payer: Self-pay

## 2023-12-10 ENCOUNTER — Other Ambulatory Visit: Payer: Self-pay

## 2023-12-10 DIAGNOSIS — Z794 Long term (current) use of insulin: Secondary | ICD-10-CM | POA: Diagnosis not present

## 2023-12-10 DIAGNOSIS — Z7984 Long term (current) use of oral hypoglycemic drugs: Secondary | ICD-10-CM | POA: Insufficient documentation

## 2023-12-10 DIAGNOSIS — I10 Essential (primary) hypertension: Secondary | ICD-10-CM | POA: Insufficient documentation

## 2023-12-10 DIAGNOSIS — E1165 Type 2 diabetes mellitus with hyperglycemia: Secondary | ICD-10-CM | POA: Insufficient documentation

## 2023-12-10 DIAGNOSIS — R739 Hyperglycemia, unspecified: Secondary | ICD-10-CM | POA: Diagnosis present

## 2023-12-10 DIAGNOSIS — Z79899 Other long term (current) drug therapy: Secondary | ICD-10-CM | POA: Diagnosis not present

## 2023-12-10 MED ORDER — SODIUM CHLORIDE 0.9 % IV BOLUS
1000.0000 mL | Freq: Once | INTRAVENOUS | Status: AC
  Administered 2023-12-11: 1000 mL via INTRAVENOUS

## 2023-12-10 NOTE — ED Provider Notes (Signed)
 [place] EMERGENCY DEPARTMENT AT [facility] Provider Note   CSN: [id number] Arrival date & time: 12/10/23  2302     Patient presents with: Hyperglycemia (PT bib [facility], c/c of hyperglycemia EMS's CBG 459. PT also complaining of vaginal itching w/ clusters of blisters x 3 weeks.)   [name] is a 33 y.o. female.   33 year old female with history of insulin -dependent diabetes, compliance presents with complaint with blood sugars in the 400s.  States that she took 20 units of her insulin , unsure what kind ate fried chicken, mashed potatoes, 4 muffins and chips and her blood sugars high.  She attributes this to her new psych meds.       Prior to Admission medications   Medication Sig Start Date End Date Taking? Authorizing Provider  amLODipine  (NORVASC ) 10 MG tablet Take 1 tablet (10 mg total) by mouth daily. 04/13/23  Yes Rollene Katz, MD  gabapentin  (NEURONTIN ) 100 MG capsule Take 1 capsule (100 mg total) by mouth 3 (three) times daily. For agitation 04/13/23  Yes Rollene Katz, MD  gabapentin  (NEURONTIN ) 300 MG capsule Take 300 mg by mouth 3 (three) times daily.   Yes [provider]  glimepiride  (AMARYL ) 4 MG tablet Take 1 tablet (4 mg total) by mouth daily. 04/13/23  Yes Rollene Katz, MD  insulin  aspart protamine- aspart (NOVOLOG  MIX 70/30) (70-30) 100 UNIT/ML injection Inject 20 Units into the skin every evening.   Yes [provider]  INVEGA  SUSTENNA 156 MG/ML SUSY injection SMARTSIG:1 IM Once 11/26/23  Yes [provider]  atorvastatin  (LIPITOR) 20 MG tablet Take 1 tablet (20 mg total) by mouth at bedtime. 04/13/23   Rollene Katz, MD  benztropine  (COGENTIN ) 0.5 MG tablet Take 1 tablet (0.5 mg total) by mouth 2 (two) times daily. For eps 04/13/23   Rollene Katz, MD  cloNIDine  (CATAPRES ) 0.1 MG tablet Take 1 tablet (0.1 mg total) by mouth daily. 04/13/23   Rollene Katz, MD  diclofenac  Sodium (VOLTAREN ) 1  % GEL Apply 4 g topically 4 (four) times daily. Patient not taking: Reported on 11/17/2023 04/13/23   Rollene Katz, MD  GLOBAL EASE INJECT PEN NEEDLES 32G X 4 MM MISC Inject 1 each into the skin as directed. 10/20/22   [provider]  haloperidol  (HALDOL ) 10 MG tablet Take 1 tablet (10 mg total) by mouth at bedtime for 14 days. 04/13/23 11/17/23  Rollene Katz, MD  lithium  carbonate (ESKALITH ) 450 MG ER tablet Take 1 tablet (450 mg total) by mouth at bedtime. For mood stabilization 04/13/23   Rollene Katz, MD  loperamide  (IMODIUM ) 2 MG capsule Take 1 capsule (2 mg total) by mouth 4 (four) times daily as needed for diarrhea or loose stools. 09/10/23   Emil Share, DO  metoprolol  tartrate (LOPRESSOR ) 25 MG tablet Take 1 tablet (25 mg total) by mouth 2 (two) times daily for 14 days. 04/13/23 11/17/23  Rollene Katz, MD  ondansetron  (ZOFRAN -ODT) 4 MG disintegrating tablet 4mg  ODT q4 hours prn nausea/vomit Patient taking differently: Take 4 mg by mouth every 4 (four) hours as needed for nausea or vomiting. 09/10/23   Emil Share, DO  traZODone  (DESYREL ) 150 MG tablet Take 1 tablet (150 mg total) by mouth at bedtime. 04/13/23   Rollene Katz, MD  venlafaxine  XR (EFFEXOR -XR) 150 MG 24 hr capsule Take 1 capsule (150 mg total) by mouth daily with breakfast. 04/13/23   Rollene Katz, MD    Allergies: Wellbutrin  [bupropion ], Omnipaque  [iohexol ], Penicillins, Atarax  [hydroxyzine ], Buspar  [buspirone ], Contrast  media [iodinated contrast media], and Cymbalta  [duloxetine  hcl]    Review of Systems Negative except as per HPI Updated Vital Signs BP (!) 157/98   Pulse (!) 107   Temp 98.1 F (36.7 C) (Oral)   Resp 18   Ht 5' 7 (1.702 m)   Wt 121.6 kg   SpO2 100%   BMI 41.97 kg/m   Physical Exam Vitals and nursing note reviewed. Exam conducted with a chaperone present.  Constitutional:      General: She is not in acute distress.    Appearance: She is well-developed. She  is obese. She is not diaphoretic.  HENT:     Head: Normocephalic and atraumatic.     Mouth/Throat:     Mouth: Mucous membranes are moist.  Cardiovascular:     Rate and Rhythm: Normal rate and regular rhythm.     Pulses: Normal pulses.     Heart sounds: Normal heart sounds.  Pulmonary:     Effort: Pulmonary effort is normal.     Breath sounds: Normal breath sounds.  Abdominal:     Palpations: Abdomen is soft.     Tenderness: There is no abdominal tenderness.  Skin:    General: Skin is warm and dry.     Findings: No erythema.  Neurological:     Mental Status: She is alert and oriented to person, place, and time.  Psychiatric:        Behavior: Behavior normal.     (all labs ordered are listed, but only abnormal results are displayed) Labs Reviewed  CBC WITH DIFFERENTIAL/PLATELET - Abnormal; Notable for the following components:      Result Value   Hemoglobin 11.1 (*)    HCT 35.7 (*)    MCH 25.0 (*)    All other components within normal limits  COMPREHENSIVE METABOLIC PANEL WITH GFR - Abnormal; Notable for the following components:   Glucose, Bld 450 (*)    Calcium  8.8 (*)    All other components within normal limits  URINALYSIS, ROUTINE W REFLEX MICROSCOPIC - Abnormal; Notable for the following components:   Color, Urine COLORLESS (*)    Glucose, UA >=500 (*)    Hgb urine dipstick SMALL (*)    All other components within normal limits  BLOOD GAS, VENOUS - Abnormal; Notable for the following components:   pCO2, Ven 38 (*)    pO2, Ven 83 (*)    All other components within normal limits  CBG MONITORING, ED - Abnormal; Notable for the following components:   Glucose-Capillary 417 (*)    All other components within normal limits  CBG MONITORING, ED - Abnormal; Notable for the following components:   Glucose-Capillary 326 (*)    All other components within normal limits    EKG: None  Radiology: No results found.   Procedures   Medications Ordered in the ED   sodium chloride  0.9 % bolus 1,000 mL (0 mLs Intravenous Stopped 12/11/23 0057)  sodium chloride  0.9 % bolus 1,000 mL (0 mLs Intravenous Stopped 12/11/23 0300)  insulin  aspart (novoLOG ) injection 8 Units (8 Units Subcutaneous Given 12/11/23 0141)                                    Medical Decision Making Amount and/or Complexity of Data Reviewed Labs: ordered.  Risk Prescription drug management.   This patient presents to the ED for concern of elevated blood sugar, this involves an  extensive number of treatment options, and is a complaint that carries with it a high risk of complications and morbidity.  The differential diagnosis includes hyperglycemia, metabolic, electrolyte, DKA   Co morbidities / Chronic conditions that complicate the patient evaluation  Diabetes, MDD, hyperlipidemia, hypertension, obesity.  Additional history as reviewed in chart   Additional history obtained:  Additional history obtained from EMR External records from outside source obtained and reviewed including prior visit to the ER dated 12/06/2023, GC chlamydia negative.  Wet prep also negative for trichomoniasis, clue cells, yeast.   Lab Tests:  I Ordered, and personally interpreted labs.  The pertinent results include: CBC without significant findings.  CMP with elevated glucose of 450, not in DKA.  Repeat glucose without significant change.  Provided additional fluids and insulin , repeat glucose is down to 326.  Urinalysis with glucose and small hemoglobin, no evidence of infection.  VBG with normal pH.   Problem List / ED Course / Critical interventions / Medication management  33 year old female with poorly controlled insulin -dependent diabetes presents with elevated blood sugar in the 400s today after high carb diet.  Patient is provided with IV fluids and insulin .  Glucose downtrending, not in DKA.  Regarding her vaginal itching, chaperone was present for exam which was unremarkable.  Recent STI  testing and wet prep negative.  Encourage patient to follow-up with PCP I ordered medication including insulin , fluids Reevaluation of the patient after these medicines showed that the patient downtrending CBG I have reviewed the patients home medicines and have made adjustments as needed  Social Determinants of Health:  Has PCP   Test / Admission - Considered:  Glucose improving, patient requesting discharge      Final diagnoses:  Hyperglycemia    ED Discharge Orders     None          Beverley Leita LABOR, PA-C 12/11/23 0423    Trine Raynell Moder, MD 12/11/23 [phone]

## 2023-12-10 NOTE — ED Triage Notes (Signed)
 PT bib [facility], c/c of hyperglycemia EMS's CBG 459. PT also complaining of vaginal itching w/ clusters of blisters x 3 weeks.

## 2023-12-11 LAB — CBG MONITORING, ED
Glucose-Capillary: 417 mg/dL — ABNORMAL HIGH (ref 70–99)
Glucose-Capillary: 326 mg/dL — ABNORMAL HIGH (ref 70–99)

## 2023-12-11 LAB — CBC WITH DIFFERENTIAL/PLATELET
Abs Immature Granulocytes: 0.02 K/uL (ref 0.00–0.07)
Basophils Absolute: 0.1 K/uL (ref 0.0–0.1)
Basophils Relative: 1 %
Eosinophils Absolute: 0.3 K/uL (ref 0.0–0.5)
Eosinophils Relative: 3 %
HCT: 35.7 % — ABNORMAL LOW (ref 36.0–46.0)
Hemoglobin: 11.1 g/dL — ABNORMAL LOW (ref 12.0–15.0)
Immature Granulocytes: 0 %
Lymphocytes Relative: 39 %
Lymphs Abs: 3.7 K/uL (ref 0.7–4.0)
MCH: 25 pg — ABNORMAL LOW (ref 26.0–34.0)
MCHC: 31.1 g/dL (ref 30.0–36.0)
MCV: 80.4 fL (ref 80.0–100.0)
Monocytes Absolute: 0.6 K/uL (ref 0.1–1.0)
Monocytes Relative: 6 %
Neutro Abs: 4.7 K/uL (ref 1.7–7.7)
Neutrophils Relative %: 51 %
Platelets: 255 K/uL (ref 150–400)
RBC: 4.44 MIL/uL (ref 3.87–5.11)
RDW: 14.3 % (ref 11.5–15.5)
WBC: 9.4 K/uL (ref 4.0–10.5)
nRBC: 0 % (ref 0.0–0.2)

## 2023-12-11 LAB — URINALYSIS, ROUTINE W REFLEX MICROSCOPIC
Bacteria, UA: NONE SEEN
Bilirubin Urine: NEGATIVE
Glucose, UA: >=500 mg/dL — AB
Ketones, ur: NEGATIVE mg/dL
Leukocytes,Ua: NEGATIVE
Nitrite: NEGATIVE
Protein, ur: NEGATIVE mg/dL
Specific Gravity, Urine: 1.022 (ref 1.005–1.030)
pH: 5 (ref 5.0–8.0)

## 2023-12-11 LAB — BLOOD GAS, VENOUS
Acid-base deficit: 0.4 mmol/L (ref 0.0–2.0)
Bicarbonate: 24.1 mmol/L (ref 20.0–28.0)
O2 Saturation: 98 %
Patient temperature: 37
pCO2, Ven: 38 mmHg — ABNORMAL LOW (ref 44–60)
pH, Ven: 7.41 (ref 7.25–7.43)
pO2, Ven: 83 mmHg — ABNORMAL HIGH (ref 32–45)

## 2023-12-11 LAB — COMPREHENSIVE METABOLIC PANEL WITH GFR
ALT: 27 U/L (ref 0–44)
AST: 30 U/L (ref 15–41)
Albumin: 3.8 g/dL (ref 3.5–5.0)
Alkaline Phosphatase: 57 U/L (ref 38–126)
Anion gap: 10 (ref 5–15)
BUN: 10 mg/dL (ref 6–20)
CO2: 22 mmol/L (ref 22–32)
Calcium: 8.8 mg/dL — ABNORMAL LOW (ref 8.9–10.3)
Chloride: 103 mmol/L (ref 98–111)
Creatinine, Ser: 0.9 mg/dL (ref 0.44–1.00)
GFR, Estimated: >60 mL/min (ref >60)
Glucose, Bld: 450 mg/dL — ABNORMAL HIGH (ref 70–99)
Potassium: 3.9 mmol/L (ref 3.5–5.1)
Sodium: 135 mmol/L (ref 135–145)
Total Bilirubin: 0.6 mg/dL (ref 0.0–1.2)
Total Protein: 7.6 g/dL (ref 6.5–8.1)

## 2023-12-11 MED ORDER — INSULIN ASPART 100 UNIT/ML IJ SOLN
8.0000 [IU] | Freq: Once | INTRAMUSCULAR | Status: AC
Start: 2023-12-11 — End: 2023-12-11
  Administered 2023-12-11: 8 [IU] via SUBCUTANEOUS
  Filled 2023-12-11: qty 0.08

## 2023-12-11 MED ORDER — SODIUM CHLORIDE 0.9 % IV BOLUS
1000.0000 mL | Freq: Once | INTRAVENOUS | Status: AC
  Administered 2023-12-11: 1000 mL via INTRAVENOUS

## 2023-12-11 NOTE — ED Notes (Addendum)
 PT refused to allow nurse to do discharge vitals and left w/o discharge papers

## 2023-12-12 ENCOUNTER — Encounter (HOSPITAL_COMMUNITY): Payer: Self-pay

## 2023-12-12 ENCOUNTER — Other Ambulatory Visit: Payer: Self-pay

## 2023-12-12 ENCOUNTER — Emergency Department (HOSPITAL_COMMUNITY)
Admission: EM | Admit: 2023-12-12 | Discharge: 2023-12-12 | Discharge status: Against Medical Advice | Payer: MEDICAID | Attending: Emergency Medicine | Admitting: Emergency Medicine

## 2023-12-12 DIAGNOSIS — E1165 Type 2 diabetes mellitus with hyperglycemia: Secondary | ICD-10-CM | POA: Diagnosis present

## 2023-12-12 DIAGNOSIS — N898 Other specified noninflammatory disorders of vagina: Secondary | ICD-10-CM | POA: Insufficient documentation

## 2023-12-12 DIAGNOSIS — R000 Tachycardia, unspecified: Secondary | ICD-10-CM | POA: Diagnosis not present

## 2023-12-12 DIAGNOSIS — Z794 Long term (current) use of insulin: Secondary | ICD-10-CM | POA: Diagnosis not present

## 2023-12-12 DIAGNOSIS — I10 Essential (primary) hypertension: Secondary | ICD-10-CM | POA: Insufficient documentation

## 2023-12-12 DIAGNOSIS — Z79899 Other long term (current) drug therapy: Secondary | ICD-10-CM | POA: Insufficient documentation

## 2023-12-12 DIAGNOSIS — R739 Hyperglycemia, unspecified: Secondary | ICD-10-CM

## 2023-12-12 LAB — CBG MONITORING, ED: Glucose-Capillary: 400 mg/dL — ABNORMAL HIGH (ref 70–99)

## 2023-12-12 MED ORDER — FLUCONAZOLE 150 MG PO TABS: 150.0000 mg | ORAL_TABLET | Freq: Once | ORAL | Status: DC

## 2023-12-12 MED ORDER — INSULIN ASPART 100 UNIT/ML IJ SOLN
8.0000 [IU] | Freq: Once | INTRAMUSCULAR | Status: DC
  Filled 2023-12-12: qty 0.08

## 2023-12-12 MED ORDER — SODIUM CHLORIDE 0.9 % IV BOLUS: 1000.0000 mL | Freq: Once | INTRAVENOUS | Status: DC

## 2023-12-12 NOTE — ED Notes (Signed)
 Pt and SO were seen by staff leaving- pt states she was ready to go home.

## 2023-12-12 NOTE — ED Triage Notes (Signed)
 Patient comes in with high blood sugar and complains of increased urination and thirst.  Also complains of burning with urination and excoriation in vagina and folds with bumps.

## 2023-12-12 NOTE — ED Provider Notes (Signed)
 [place] EMERGENCY DEPARTMENT AT [facility] Provider Note   CSN: [id number] Arrival date & time: 12/12/23  8182     Patient presents with: Hyperglycemia   [name] is a 33 y.o. female.   Pt is a 33 yo female with pmhx significant for poorly controlled DM2, HTN, HLD, obesity, arthritis, and bipolar d/o.  Pt was here on 8/15 for elevated BS.  BS came down with fluids and meds.  Pt comes in today because her BS remains elevated.  She asks if I think it's from all the regular gatorade she's been drinking.  Pt said she's been taking her meds.  No pain, but she has some continued vaginal irritation.  She did have a pelvic exam on 8/11 and swabs were neg.       Prior to Admission medications   Medication Sig Start Date End Date Taking? Authorizing Provider  amLODipine  (NORVASC ) 10 MG tablet Take 1 tablet (10 mg total) by mouth daily. 04/13/23   Rollene Katz, MD  atorvastatin  (LIPITOR) 20 MG tablet Take 1 tablet (20 mg total) by mouth at bedtime. 04/13/23   Rollene Katz, MD  benztropine  (COGENTIN ) 0.5 MG tablet Take 1 tablet (0.5 mg total) by mouth 2 (two) times daily. For eps 04/13/23   Rollene Katz, MD  cloNIDine  (CATAPRES ) 0.1 MG tablet Take 1 tablet (0.1 mg total) by mouth daily. 04/13/23   Rollene Katz, MD  diclofenac  Sodium (VOLTAREN ) 1 % GEL Apply 4 g topically 4 (four) times daily. Patient not taking: Reported on 11/17/2023 04/13/23   Rollene Katz, MD  gabapentin  (NEURONTIN ) 100 MG capsule Take 1 capsule (100 mg total) by mouth 3 (three) times daily. For agitation 04/13/23   Rollene Katz, MD  gabapentin  (NEURONTIN ) 300 MG capsule Take 300 mg by mouth 3 (three) times daily.    [provider]  glimepiride  (AMARYL ) 4 MG tablet Take 1 tablet (4 mg total) by mouth daily. 04/13/23   Rollene Katz, MD  GLOBAL EASE INJECT PEN NEEDLES 32G X 4 MM MISC Inject 1 each into the skin as directed. 10/20/22   [provider]  haloperidol  (HALDOL ) 10 MG tablet Take 1 tablet (10 mg total) by mouth at bedtime for 14 days. 04/13/23 11/17/23  Rollene Katz, MD  insulin  aspart protamine- aspart (NOVOLOG  MIX 70/30) (70-30) 100 UNIT/ML injection Inject 20 Units into the skin every evening.    [provider]  INVEGA  SUSTENNA 156 MG/ML SUSY injection SMARTSIG:1 IM Once 11/26/23   [provider]  lithium  carbonate (ESKALITH ) 450 MG ER tablet Take 1 tablet (450 mg total) by mouth at bedtime. For mood stabilization 04/13/23   Rollene Katz, MD  loperamide  (IMODIUM ) 2 MG capsule Take 1 capsule (2 mg total) by mouth 4 (four) times daily as needed for diarrhea or loose stools. 09/10/23   Emil Share, DO  metoprolol  tartrate (LOPRESSOR ) 25 MG tablet Take 1 tablet (25 mg total) by mouth 2 (two) times daily for 14 days. 04/13/23 11/17/23  Rollene Katz, MD  ondansetron  (ZOFRAN -ODT) 4 MG disintegrating tablet 4mg  ODT q4 hours prn nausea/vomit Patient taking differently: Take 4 mg by mouth every 4 (four) hours as needed for nausea or vomiting. 09/10/23   Emil Share, DO  traZODone  (DESYREL ) 150 MG tablet Take 1 tablet (150 mg total) by mouth at bedtime. 04/13/23   Rollene Katz, MD  venlafaxine  XR (EFFEXOR -XR) 150 MG 24 hr capsule Take 1 capsule (150 mg total) by mouth daily with breakfast. 04/13/23  Rollene Katz, MD    Allergies: Wellbutrin  [bupropion ], Omnipaque  [iohexol ], Penicillins, Atarax  [hydroxyzine ], Buspar  [buspirone ], Contrast media [iodinated contrast media], and Cymbalta  [duloxetine  hcl]    Review of Systems  Endocrine: Positive for polydipsia and polyuria.  Genitourinary:  Positive for vaginal pain.  All other systems reviewed and are negative.   Updated Vital Signs BP (!) 159/100 (BP Location: Right Arm)   Pulse (!) 120   Temp 98.4 F (36.9 C) (Oral)   Resp 16   Ht 5' 7 (1.702 m)   Wt 121.6 kg   SpO2 100%   BMI 41.97 kg/m   Physical Exam Vitals and nursing note  reviewed.  Constitutional:      Appearance: Normal appearance. She is obese.  HENT:     Head: Normocephalic and atraumatic.     Right Ear: External ear normal.     Left Ear: External ear normal.     Nose: Nose normal.     Mouth/Throat:     Mouth: Mucous membranes are dry.  Eyes:     Extraocular Movements: Extraocular movements intact.     Conjunctiva/sclera: Conjunctivae normal.     Pupils: Pupils are equal, round, and reactive to light.  Cardiovascular:     Rate and Rhythm: Regular rhythm. Tachycardia present.     Pulses: Normal pulses.     Heart sounds: Normal heart sounds.  Pulmonary:     Effort: Pulmonary effort is normal.     Breath sounds: Normal breath sounds.  Abdominal:     General: Abdomen is flat. Bowel sounds are normal.     Palpations: Abdomen is soft.  Musculoskeletal:        General: Normal range of motion.     Cervical back: Normal range of motion and neck supple.  Skin:    General: Skin is warm.     Capillary Refill: Capillary refill takes less than 2 seconds.  Neurological:     General: No focal deficit present.     Mental Status: She is alert and oriented to person, place, and time.  Psychiatric:        Mood and Affect: Mood normal.        Behavior: Behavior normal.     (all labs ordered are listed, but only abnormal results are displayed) Labs Reviewed  CBG MONITORING, ED - Abnormal; Notable for the following components:      Result Value   Glucose-Capillary 400 (*)    All other components within normal limits  URINALYSIS, ROUTINE W REFLEX MICROSCOPIC  HCG, SERUM, QUALITATIVE  BETA-HYDROXYBUTYRIC ACID  CBC WITH DIFFERENTIAL/PLATELET  BLOOD GAS, VENOUS  LITHIUM  LEVEL  HEMOGLOBIN A1C  CBG MONITORING, ED    EKG: None  Radiology: No results found.   Procedures   Medications Ordered in the ED  sodium chloride  0.9 % bolus 1,000 mL (has no administration in time range)  insulin  aspart (novoLOG ) injection 8 Units (has no administration in  time range)  fluconazole  (DIFLUCAN ) tablet 150 mg (has no administration in time range)                                    Medical Decision Making Amount and/or Complexity of Data Reviewed Labs: ordered.  Risk Prescription drug management.   This patient presents to the ED for concern of elevated blood sugar, this involves an extensive number of treatment options, and is a complaint that carries with it a high  risk of complications and morbidity.  The differential diagnosis includes dka, hhs, noncompliance, poor dietary choices, infection   Co morbidities that complicate the patient evaluation  poorly controlled DM2, HTN, HLD, obesity, arthritis, and bipolar d/o   Additional history obtained:  Additional history obtained from epic chart review External records from outside source obtained and reviewed including friend   Lab Tests:  I Ordered, and personally interpreted labs.  The pertinent results include:  cbg 400   Cardiac Monitoring:  The patient was maintained on a cardiac monitor.  I personally viewed and interpreted the cardiac monitored which showed an underlying rhythm of: st   Medicines ordered and prescription drug management:  I ordered medication including ivfs/iv insulin   for hyperglycemia   Problem List / ED Course:  Hyperglycemia:  the nurse was unable to get an IV or meds, so she called the IV team.  Pt was seen leaving and told one of the nurses she did not want to stay.  I was not aware of this until she had left, so I did not get to talk to her.  However, I did tell her to avoid Gatorade.  Social Determinants of Health:  Lives at home   Dispostion:  Eloped     Final diagnoses:  Hyperglycemia    ED Discharge Orders     None          Dean Clarity, MD 12/12/23 2028

## 2023-12-12 NOTE — ED Notes (Signed)
 Unsuccessful IV attempt x2.

## 2023-12-14 ENCOUNTER — Encounter (HOSPITAL_COMMUNITY): Payer: Self-pay | Admitting: Emergency Medicine

## 2023-12-14 ENCOUNTER — Emergency Department (HOSPITAL_COMMUNITY)
Admission: EM | Admit: 2023-12-14 | Discharge: 2023-12-14 | Discharge status: Against Medical Advice | Payer: MEDICAID | Attending: Emergency Medicine | Admitting: Emergency Medicine

## 2023-12-14 ENCOUNTER — Other Ambulatory Visit: Payer: Self-pay

## 2023-12-14 DIAGNOSIS — Z5321 Procedure and treatment not carried out due to patient leaving prior to being seen by health care provider: Secondary | ICD-10-CM | POA: Insufficient documentation

## 2023-12-14 DIAGNOSIS — R739 Hyperglycemia, unspecified: Secondary | ICD-10-CM | POA: Insufficient documentation

## 2023-12-14 LAB — CBG MONITORING, ED: Glucose-Capillary: 258 mg/dL — ABNORMAL HIGH (ref 70–99)

## 2023-12-14 NOTE — ED Notes (Signed)
 Patient stated she want to check her CBG. Patient left after CBG was 258. Patient left the department.

## 2023-12-18 ENCOUNTER — Emergency Department (HOSPITAL_COMMUNITY)
Admission: EM | Admit: 2023-12-18 | Discharge: 2023-12-19 | Disposition: A | Discharge status: Home / Self Care | Payer: MEDICAID | Attending: Emergency Medicine | Admitting: Emergency Medicine

## 2023-12-18 ENCOUNTER — Encounter (HOSPITAL_COMMUNITY): Payer: Self-pay

## 2023-12-18 ENCOUNTER — Emergency Department (HOSPITAL_COMMUNITY): Payer: MEDICAID

## 2023-12-18 ENCOUNTER — Other Ambulatory Visit: Payer: Self-pay

## 2023-12-18 DIAGNOSIS — Z794 Long term (current) use of insulin: Secondary | ICD-10-CM | POA: Diagnosis not present

## 2023-12-18 DIAGNOSIS — N898 Other specified noninflammatory disorders of vagina: Secondary | ICD-10-CM | POA: Insufficient documentation

## 2023-12-18 DIAGNOSIS — R300 Dysuria: Secondary | ICD-10-CM | POA: Insufficient documentation

## 2023-12-18 DIAGNOSIS — R739 Hyperglycemia, unspecified: Secondary | ICD-10-CM

## 2023-12-18 DIAGNOSIS — N390 Urinary tract infection, site not specified: Secondary | ICD-10-CM

## 2023-12-18 DIAGNOSIS — Z7984 Long term (current) use of oral hypoglycemic drugs: Secondary | ICD-10-CM | POA: Insufficient documentation

## 2023-12-18 DIAGNOSIS — B379 Candidiasis, unspecified: Secondary | ICD-10-CM

## 2023-12-18 DIAGNOSIS — R3589 Other polyuria: Secondary | ICD-10-CM | POA: Diagnosis present

## 2023-12-18 DIAGNOSIS — R202 Paresthesia of skin: Secondary | ICD-10-CM | POA: Insufficient documentation

## 2023-12-18 DIAGNOSIS — E1165 Type 2 diabetes mellitus with hyperglycemia: Secondary | ICD-10-CM | POA: Diagnosis not present

## 2023-12-18 DIAGNOSIS — R079 Chest pain, unspecified: Secondary | ICD-10-CM | POA: Diagnosis not present

## 2023-12-18 LAB — I-STAT CHEM 8, ED
BUN: 11 mg/dL (ref 6–20)
Calcium, Ion: 1.24 mmol/L (ref 1.15–1.40)
Chloride: 101 mmol/L (ref 98–111)
Creatinine, Ser: 0.8 mg/dL (ref 0.44–1.00)
Glucose, Bld: 367 mg/dL — ABNORMAL HIGH (ref 70–99)
HCT: 39 % (ref 36.0–46.0)
Hemoglobin: 13.3 g/dL (ref 12.0–15.0)
Potassium: 3.6 mmol/L (ref 3.5–5.1)
Sodium: 138 mmol/L (ref 135–145)
TCO2: 23 mmol/L (ref 22–32)

## 2023-12-18 LAB — CBC
HCT: 37.8 % (ref 36.0–46.0)
Hemoglobin: 11.9 g/dL — ABNORMAL LOW (ref 12.0–15.0)
MCH: 25 pg — ABNORMAL LOW (ref 26.0–34.0)
MCHC: 31.5 g/dL (ref 30.0–36.0)
MCV: 79.4 fL — ABNORMAL LOW (ref 80.0–100.0)
Platelets: 253 K/uL (ref 150–400)
RBC: 4.76 MIL/uL (ref 3.87–5.11)
RDW: 14.3 % (ref 11.5–15.5)
WBC: 9.7 K/uL (ref 4.0–10.5)
nRBC: 0 % (ref 0.0–0.2)

## 2023-12-18 LAB — COMPREHENSIVE METABOLIC PANEL WITH GFR
ALT: 31 U/L (ref 0–44)
AST: 27 U/L (ref 15–41)
Albumin: 3.7 g/dL (ref 3.5–5.0)
Alkaline Phosphatase: 48 U/L (ref 38–126)
Anion gap: 11 (ref 5–15)
BUN: 12 mg/dL (ref 6–20)
CO2: 24 mmol/L (ref 22–32)
Calcium: 9.1 mg/dL (ref 8.9–10.3)
Chloride: 101 mmol/L (ref 98–111)
Creatinine, Ser: 0.71 mg/dL (ref 0.44–1.00)
GFR, Estimated: >60 mL/min (ref >60)
Glucose, Bld: 329 mg/dL — ABNORMAL HIGH (ref 70–99)
Potassium: 3.6 mmol/L (ref 3.5–5.1)
Sodium: 136 mmol/L (ref 135–145)
Total Bilirubin: 0.6 mg/dL (ref 0.0–1.2)
Total Protein: 7.4 g/dL (ref 6.5–8.1)

## 2023-12-18 LAB — URINALYSIS, ROUTINE W REFLEX MICROSCOPIC
Bilirubin Urine: NEGATIVE
Glucose, UA: >=500 mg/dL — AB
Ketones, ur: 5 mg/dL — AB
Nitrite: NEGATIVE
Protein, ur: NEGATIVE mg/dL
Specific Gravity, Urine: 1.028 (ref 1.005–1.030)
pH: 5 (ref 5.0–8.0)

## 2023-12-18 LAB — BLOOD GAS, VENOUS
Acid-Base Excess: 2.4 mmol/L — ABNORMAL HIGH (ref 0.0–2.0)
Bicarbonate: 27.2 mmol/L (ref 20.0–28.0)
O2 Saturation: 84.3 %
Patient temperature: 37
pCO2, Ven: 42 mmHg — ABNORMAL LOW (ref 44–60)
pH, Ven: 7.42 (ref 7.25–7.43)
pO2, Ven: 50 mmHg — ABNORMAL HIGH (ref 32–45)

## 2023-12-18 LAB — CBG MONITORING, ED: Glucose-Capillary: 356 mg/dL — ABNORMAL HIGH (ref 70–99)

## 2023-12-18 LAB — HCG, SERUM, QUALITATIVE: Preg, Serum: NEGATIVE

## 2023-12-18 MED ORDER — SODIUM CHLORIDE 0.9 % IV SOLN
1.0000 g | Freq: Once | INTRAVENOUS | Status: AC
  Administered 2023-12-18: 1 g via INTRAVENOUS
  Filled 2023-12-18: qty 10

## 2023-12-18 MED ORDER — LACTATED RINGERS IV BOLUS
1000.0000 mL | Freq: Once | INTRAVENOUS | Status: AC
  Administered 2023-12-19: 1000 mL via INTRAVENOUS

## 2023-12-18 MED ORDER — FLUCONAZOLE 150 MG PO TABS
150.0000 mg | ORAL_TABLET | Freq: Once | ORAL | Status: AC
  Administered 2023-12-19: 150 mg via ORAL
  Filled 2023-12-18: qty 1

## 2023-12-18 MED ORDER — LACTATED RINGERS IV BOLUS
1000.0000 mL | Freq: Once | INTRAVENOUS | Status: AC
  Administered 2023-12-18: 1000 mL via INTRAVENOUS

## 2023-12-18 NOTE — ED Triage Notes (Signed)
 Pt also reports significant vaginal burning/pain.

## 2023-12-18 NOTE — ED Provider Notes (Signed)
 [place] EMERGENCY DEPARTMENT AT [facility] Provider Note   CSN: [id number] Arrival date & time: 12/18/23  2121     Patient presents with: Hyperglycemia   [name] is a 33 y.o. female with type 2 diabetes on basal insulin  and Amaryl  who presents to the emergency department today for further evaluation of elevated blood sugars.  She states that over the last 3 to 4 weeks her blood sugars have been anywhere between 300-500.  She is talked with her primary care doctor and they increased her long-acting insulin  from 20 units to 30 units.  She does not take any bolus insulin .  She endorses polyphasia, polydipsia, polyuria.  She is also endorsing some associated vaginal burning when she wipes and some dysuria.  She also endorses some intermittent tingling to her hands and feet.  Also endorses some chest pain intermittently but none currently.  No shortness of breath, fever, chills.  She does endorse nausea and vomiting.  Nausea and vomiting is improved with Zofran  that she is taking approximately every other day.    Hyperglycemia      Prior to Admission medications   Medication Sig Start Date End Date Taking? Authorizing Provider  amLODipine  (NORVASC ) 10 MG tablet Take 1 tablet (10 mg total) by mouth daily. 04/13/23   Rollene Katz, MD  atorvastatin  (LIPITOR) 20 MG tablet Take 1 tablet (20 mg total) by mouth at bedtime. 04/13/23   Rollene Katz, MD  benztropine  (COGENTIN ) 0.5 MG tablet Take 1 tablet (0.5 mg total) by mouth 2 (two) times daily. For eps 04/13/23   Rollene Katz, MD  cloNIDine  (CATAPRES ) 0.1 MG tablet Take 1 tablet (0.1 mg total) by mouth daily. 04/13/23   Rollene Katz, MD  diclofenac  Sodium (VOLTAREN ) 1 % GEL Apply 4 g topically 4 (four) times daily. Patient not taking: Reported on 11/17/2023 04/13/23   Rollene Katz, MD  gabapentin  (NEURONTIN ) 100 MG capsule Take 1 capsule (100 mg total) by mouth 3 (three) times daily. For agitation  04/13/23   Rollene Katz, MD  gabapentin  (NEURONTIN ) 300 MG capsule Take 300 mg by mouth 3 (three) times daily.    [provider]  glimepiride  (AMARYL ) 4 MG tablet Take 1 tablet (4 mg total) by mouth daily. 04/13/23   Rollene Katz, MD  GLOBAL EASE INJECT PEN NEEDLES 32G X 4 MM MISC Inject 1 each into the skin as directed. 10/20/22   [provider]  haloperidol  (HALDOL ) 10 MG tablet Take 1 tablet (10 mg total) by mouth at bedtime for 14 days. 04/13/23 11/17/23  Rollene Katz, MD  insulin  aspart protamine- aspart (NOVOLOG  MIX 70/30) (70-30) 100 UNIT/ML injection Inject 20 Units into the skin every evening.    [provider]  INVEGA  SUSTENNA 156 MG/ML SUSY injection SMARTSIG:1 IM Once 11/26/23   [provider]  lithium  carbonate (ESKALITH ) 450 MG ER tablet Take 1 tablet (450 mg total) by mouth at bedtime. For mood stabilization 04/13/23   Rollene Katz, MD  loperamide  (IMODIUM ) 2 MG capsule Take 1 capsule (2 mg total) by mouth 4 (four) times daily as needed for diarrhea or loose stools. 09/10/23   Emil Share, DO  metoprolol  tartrate (LOPRESSOR ) 25 MG tablet Take 1 tablet (25 mg total) by mouth 2 (two) times daily for 14 days. 04/13/23 11/17/23  Rollene Katz, MD  ondansetron  (ZOFRAN -ODT) 4 MG disintegrating tablet 4mg  ODT q4 hours prn nausea/vomit Patient taking differently: Take 4 mg by mouth every 4 (four) hours as needed for nausea  or vomiting. 09/10/23   Emil Share, DO  traZODone  (DESYREL ) 150 MG tablet Take 1 tablet (150 mg total) by mouth at bedtime. 04/13/23   Rollene Katz, MD  venlafaxine  XR (EFFEXOR -XR) 150 MG 24 hr capsule Take 1 capsule (150 mg total) by mouth daily with breakfast. 04/13/23   Rollene Katz, MD    Allergies: Wellbutrin  [bupropion ], Omnipaque  [iohexol ], Penicillins, Atarax  [hydroxyzine ], Buspar  [buspirone ], Contrast media [iodinated contrast media], and Cymbalta  [duloxetine  hcl]    Review of Systems  All  other systems reviewed and are negative.   Updated Vital Signs BP (!) 155/98   Pulse (!) 112   Temp 98.8 F (37.1 C) (Oral)   Resp 15   Ht 5' 7 (1.702 m)   Wt 122.5 kg   SpO2 99%   BMI 42.29 kg/m   Physical Exam Vitals and nursing note reviewed.  Constitutional:      General: She is not in acute distress.    Appearance: Normal appearance.  HENT:     Head: Normocephalic and atraumatic.  Eyes:     General:        Right eye: No discharge.        Left eye: No discharge.  Cardiovascular:     Rate and Rhythm: Tachycardia present.     Comments: S1/S2 are distinct without any evidence of murmur, rubs, or gallops.  Radial pulses are 2+ bilaterally.  Dorsalis pedis pulses are 2+ bilaterally.  No evidence of pedal edema. Pulmonary:     Comments: Clear to auscultation bilaterally.  Normal effort.  No respiratory distress.  No evidence of wheezes, rales, or rhonchi heard throughout. Abdominal:     General: Abdomen is flat. Bowel sounds are normal. There is no distension.     Tenderness: There is no abdominal tenderness. There is no guarding or rebound.  Musculoskeletal:        General: Normal range of motion.     Cervical back: Neck supple.  Skin:    General: Skin is warm and dry.     Findings: No rash.  Neurological:     General: No focal deficit present.     Mental Status: She is alert.  Psychiatric:        Mood and Affect: Mood normal.        Behavior: Behavior normal.     (all labs ordered are listed, but only abnormal results are displayed) Labs Reviewed  CBC - Abnormal; Notable for the following components:      Result Value   Hemoglobin 11.9 (*)    MCV 79.4 (*)    MCH 25.0 (*)    All other components within normal limits  URINALYSIS, ROUTINE W REFLEX MICROSCOPIC - Abnormal; Notable for the following components:   APPearance CLOUDY (*)    Glucose, UA >=500 (*)    Hgb urine dipstick SMALL (*)    Ketones, ur 5 (*)    Leukocytes,Ua LARGE (*)    Bacteria, UA FEW  (*)    All other components within normal limits  BLOOD GAS, VENOUS - Abnormal; Notable for the following components:   pCO2, Ven 42 (*)    pO2, Ven 50 (*)    Acid-Base Excess 2.4 (*)    All other components within normal limits  CBG MONITORING, ED - Abnormal; Notable for the following components:   Glucose-Capillary 356 (*)    All other components within normal limits  I-STAT CHEM 8, ED - Abnormal; Notable for the following components:   Glucose, Bld  367 (*)    All other components within normal limits  HCG, SERUM, QUALITATIVE  BETA-HYDROXYBUTYRIC ACID  COMPREHENSIVE METABOLIC PANEL WITH GFR    EKG: None  Radiology: DG Chest Portable 1 View Result Date: 12/18/2023 CLINICAL DATA:  chest pain EXAM: PORTABLE CHEST 1 VIEW COMPARISON:  Chest x-ray 11/29/2022 FINDINGS: The heart and mediastinal contours are within normal limits. Low lung volumes. No focal consolidation. No pulmonary edema. No pleural effusion. No pneumothorax. No acute osseous abnormality. IMPRESSION: Low lung volumes with no active disease. Electronically Signed   By: Morgane  Naveau M.D.   On: 12/18/2023 22:57     Procedures   Medications Ordered in the ED  cefTRIAXone  (ROCEPHIN ) 1 g in sodium chloride  0.9 % 100 mL IVPB (has no administration in time range)  lactated ringers  bolus 1,000 mL (has no administration in time range)  lactated ringers  bolus 1,000 mL (1,000 mLs Intravenous New Bag/Given 12/18/23 2227)     Medical Decision Making [name] is a 33 y.o. female patient who presents to the emerged from today for further evaluation of elevated blood sugars.  Given the history and physical exam I am somewhat concerned for DKA.  Will plan to give her 1 L of lactated Ringer 's and get some additional lab work to further assess.  I suspect a lot of her symptoms are related to her consistently high blood sugars.  Patient does not have a metabolic acidosis.  DKA less likely at this time.  Will give the patient  another liter of fluids.  I do feel that the patient's blood sugar needs to be rechecked.  As long as we are between 200-300 I think this is appropriate considering that the patient has been between 300 and 500 for the last month. Due to shift change, the rest of the patient's care will be transferred to oncoming provider. Ultimate disposition still pending.   Amount and/or Complexity of Data Reviewed Labs: ordered. Radiology: ordered.     Final diagnoses:  None    ED Discharge Orders     None          Theotis Cameron CHRISTELLA [name] 12/18/23 2332    Patsey Lot, MD 12/19/23 [phone]

## 2023-12-18 NOTE — ED Provider Notes (Signed)
 Accepted handoff at shift change from [facility]. Please see prior provider note for more detail.   Briefly: Patient is 33 y.o. past medical history significant for diabetes presents today for elevated blood sugar, polydipsia, polyuria, urinary frequency, nausea, and vaginal discomfort.  DDX: concern for DKA, HHS, hyperglycemia, UTI, yeast infection  Plan: Beta hydroxy, fluids, CBG and likely discharge with UTI treatment.  Physical Exam  BP (!) 155/98   Pulse (!) 112   Temp 98.8 F (37.1 C) (Oral)   Resp 15   Ht 5' 7 (1.702 m)   Wt 122.5 kg   SpO2 99%   BMI 42.29 kg/m   Physical Exam Vitals and nursing note reviewed.  Constitutional:      General: She is not in acute distress.    Appearance: Normal appearance. She is well-developed. She is obese. She is not toxic-appearing.  HENT:     Head: Normocephalic and atraumatic.     Right Ear: External ear normal.     Left Ear: External ear normal.  Eyes:     Conjunctiva/sclera: Conjunctivae normal.  Pulmonary:     Effort: Pulmonary effort is normal. No respiratory distress.  Musculoskeletal:     Cervical back: Neck supple.  Skin:    General: Skin is warm and dry.     Capillary Refill: Capillary refill takes less than 2 seconds.  Neurological:     Mental Status: She is alert.  Psychiatric:        Mood and Affect: Mood normal.     Procedures  Procedures  ED Course / MDM   Clinical Course as of 12/18/23 2352  Sat Dec 18, 2023  2349 CBC(!) Mild anemia.  [CF]  2350 Comprehensive metabolic panel(!) Elevated blood sugar in the setting of diabetes. [CF]  2350 Urinalysis, Routine w reflex microscopic -Urine, Clean Catch(!) Evidence of yeast and UTI although elements of contamination.  [CF]  2350 Blood gas, venous (at [facility] and [facility])(!) No metabolic acidosis.  [CF]    Clinical Course User Index [CF] Theotis Cameron HERO, PA-C   Medical Decision Making Amount and/or Complexity of Data Reviewed Labs: ordered.  Decision-making details documented in ED Course. Radiology: ordered.  Risk Prescription drug management.   Beta hydroxy 0.23 Repeat CBG after fluids was 272  Considered for admission or further workup however patient's vital signs, physical exam, labs, and imaging are reassuring.  Patient's symptoms likely due to hyperglycemia, yeast infection, and UTI.  Patient advised to follow-up with primary care regarding her diabetes treatment.  Patient given Diflucan  in the ED.  Patient given outpatient course of Keflex  for UTI treatment.  I feel patient is safe for discharge at this time.        Francis Ileana SAILOR, PA-C 12/19/23 0140    Trine Raynell Moder, MD 12/19/23 [phone]

## 2023-12-18 NOTE — ED Triage Notes (Signed)
 Pt reports being seen by your PCP last Wednesday for elevated blood glucose and had her insulin  increased from 20u to 30u. Pt reports eating a lot more and drinking frequently over the past week and that her glucose has been in 400-500s. Pt states my sugar's been running high ever since I left [facility]. Pt reports feeling terrible. Increased urinary frequency reported. Associated nausea, denies vomiting.

## 2023-12-19 LAB — BETA-HYDROXYBUTYRIC ACID: Beta-Hydroxybutyric Acid: 0.23 mmol/L (ref 0.05–0.27)

## 2023-12-19 LAB — CBG MONITORING, ED: Glucose-Capillary: 272 mg/dL — ABNORMAL HIGH (ref 70–99)

## 2023-12-19 MED ORDER — CEPHALEXIN 500 MG PO CAPS: 500.0000 mg | ORAL_CAPSULE | Freq: Two times a day (BID) | ORAL | 0 refills | Status: DC

## 2023-12-19 NOTE — Discharge Instructions (Addendum)
 Today you were seen for hyperglycemia, UTI, and a yeast infection.  Please pick up your antibiotic and take as prescribed.  Please follow-up with your primary care for further evaluation and treatment of your diabetes.  Thank you for letting us  treat you today. After reviewing your labs and imaging, I feel you are safe to go home. Please follow up with your PCP in the next several days and provide them with your records from this visit. Return to the Emergency Room if pain becomes severe or symptoms worsen.

## 2024-01-02 ENCOUNTER — Emergency Department (HOSPITAL_COMMUNITY)
Admission: EM | Admit: 2024-01-02 | Discharge: 2024-01-02 | Disposition: A | Discharge status: Home / Self Care | Payer: MEDICAID | Attending: Emergency Medicine | Admitting: Emergency Medicine

## 2024-01-02 ENCOUNTER — Encounter (HOSPITAL_COMMUNITY): Payer: Self-pay

## 2024-01-02 ENCOUNTER — Other Ambulatory Visit: Payer: Self-pay

## 2024-01-02 ENCOUNTER — Emergency Department (HOSPITAL_COMMUNITY): Payer: MEDICAID

## 2024-01-02 DIAGNOSIS — Z79899 Other long term (current) drug therapy: Secondary | ICD-10-CM | POA: Insufficient documentation

## 2024-01-02 DIAGNOSIS — R112 Nausea with vomiting, unspecified: Secondary | ICD-10-CM | POA: Diagnosis not present

## 2024-01-02 DIAGNOSIS — Z794 Long term (current) use of insulin: Secondary | ICD-10-CM | POA: Diagnosis not present

## 2024-01-02 DIAGNOSIS — R739 Hyperglycemia, unspecified: Secondary | ICD-10-CM | POA: Diagnosis present

## 2024-01-02 LAB — CBG MONITORING, ED: Glucose-Capillary: 260 mg/dL — ABNORMAL HIGH (ref 70–99)

## 2024-01-02 MED ORDER — SODIUM CHLORIDE 0.9 % IV BOLUS: 1000.0000 mL | Freq: Once | INTRAVENOUS | Status: DC

## 2024-01-02 NOTE — ED Triage Notes (Addendum)
 Pt presents to ED from home C/O nausea, vomiting X 1 week. Also reports hyperglycemia, reports blood sugars 400-500 at home. CBG 260 in triage.

## 2024-01-02 NOTE — ED Provider Notes (Signed)
 [place] EMERGENCY DEPARTMENT AT [facility] Provider Note   CSN: [id number] Arrival date & time: 01/02/24  1426     Patient presents with: Emesis and Hyperglycemia   [name] is a 33 y.o. female.   33 yo F with a chief complaints of hyperglycemia.  The patient states that this been an ongoing problem for her she has been working with the family doctor to try and reregulate her insulin .  They are currently in the process of this.  She felt her blood sugar was too high and that she was having increased urinary output and felt generally fatigued and so came into the ED for evaluation.  She tells me she has been coughing quite a bit.  Had some chest pain yesterday that seems to have resolved.  Urinating more frequently but no dysuria or hesitancy.   Emesis Hyperglycemia Associated symptoms: vomiting        Prior to Admission medications   Medication Sig Start Date End Date Taking? Authorizing Provider  amLODipine  (NORVASC ) 10 MG tablet Take 1 tablet (10 mg total) by mouth daily. 04/13/23   Rollene Katz, MD  atorvastatin  (LIPITOR) 20 MG tablet Take 1 tablet (20 mg total) by mouth at bedtime. 04/13/23   Rollene Katz, MD  benztropine  (COGENTIN ) 0.5 MG tablet Take 1 tablet (0.5 mg total) by mouth 2 (two) times daily. For eps 04/13/23   Rollene Katz, MD  cephALEXin  (KEFLEX ) 500 MG capsule Take 1 capsule (500 mg total) by mouth 2 (two) times daily. 12/19/23   Keith, Kayla N, PA-C  cloNIDine  (CATAPRES ) 0.1 MG tablet Take 1 tablet (0.1 mg total) by mouth daily. 04/13/23   Rollene Katz, MD  diclofenac  Sodium (VOLTAREN ) 1 % GEL Apply 4 g topically 4 (four) times daily. Patient not taking: Reported on 11/17/2023 04/13/23   Rollene Katz, MD  gabapentin  (NEURONTIN ) 100 MG capsule Take 1 capsule (100 mg total) by mouth 3 (three) times daily. For agitation 04/13/23   Rollene Katz, MD  gabapentin  (NEURONTIN ) 300 MG capsule Take 300 mg by mouth  3 (three) times daily.    [provider]  glimepiride  (AMARYL ) 4 MG tablet Take 1 tablet (4 mg total) by mouth daily. 04/13/23   Rollene Katz, MD  GLOBAL EASE INJECT PEN NEEDLES 32G X 4 MM MISC Inject 1 each into the skin as directed. 10/20/22   [provider]  haloperidol  (HALDOL ) 10 MG tablet Take 1 tablet (10 mg total) by mouth at bedtime for 14 days. 04/13/23 11/17/23  Rollene Katz, MD  insulin  aspart protamine- aspart (NOVOLOG  MIX 70/30) (70-30) 100 UNIT/ML injection Inject 20 Units into the skin every evening.    [provider]  INVEGA  SUSTENNA 156 MG/ML SUSY injection SMARTSIG:1 IM Once 11/26/23   [provider]  lithium  carbonate (ESKALITH ) 450 MG ER tablet Take 1 tablet (450 mg total) by mouth at bedtime. For mood stabilization 04/13/23   Rollene Katz, MD  loperamide  (IMODIUM ) 2 MG capsule Take 1 capsule (2 mg total) by mouth 4 (four) times daily as needed for diarrhea or loose stools. 09/10/23   Emil Share, DO  metoprolol  tartrate (LOPRESSOR ) 25 MG tablet Take 1 tablet (25 mg total) by mouth 2 (two) times daily for 14 days. 04/13/23 11/17/23  Rollene Katz, MD  ondansetron  (ZOFRAN -ODT) 4 MG disintegrating tablet 4mg  ODT q4 hours prn nausea/vomit Patient taking differently: Take 4 mg by mouth every 4 (four) hours as needed for nausea or vomiting. 09/10/23   Emil,  Ellamay Fors, DO  traZODone  (DESYREL ) 150 MG tablet Take 1 tablet (150 mg total) by mouth at bedtime. 04/13/23   Rollene Katz, MD  venlafaxine  XR (EFFEXOR -XR) 150 MG 24 hr capsule Take 1 capsule (150 mg total) by mouth daily with breakfast. 04/13/23   Rollene Katz, MD    Allergies: Wellbutrin  [bupropion ], Omnipaque  [iohexol ], Penicillins, Atarax  [hydroxyzine ], Buspar  [buspirone ], Contrast media [iodinated contrast media], and Cymbalta  [duloxetine  hcl]    Review of Systems  Gastrointestinal:  Positive for vomiting.    Updated Vital Signs BP (!) 167/113 (BP Location:  Right Arm)   Pulse (!) 115   Temp 98.2 F (36.8 C) (Oral)   Resp 18   LMP 12/13/2023 (Approximate)   SpO2 95%   Physical Exam Vitals and nursing note reviewed.  Constitutional:      General: She is not in acute distress.    Appearance: She is well-developed. She is not diaphoretic.  HENT:     Head: Normocephalic and atraumatic.  Eyes:     Pupils: Pupils are equal, round, and reactive to light.  Cardiovascular:     Rate and Rhythm: Normal rate and regular rhythm.     Heart sounds: No murmur heard.    No friction rub. No gallop.  Pulmonary:     Effort: Pulmonary effort is normal.     Breath sounds: No wheezing or rales.  Abdominal:     General: There is no distension.     Palpations: Abdomen is soft.     Tenderness: There is no abdominal tenderness.  Musculoskeletal:        General: No tenderness.     Cervical back: Normal range of motion and neck supple.  Skin:    General: Skin is warm and dry.  Neurological:     Mental Status: She is alert and oriented to person, place, and time.  Psychiatric:        Behavior: Behavior normal.     (all labs ordered are listed, but only abnormal results are displayed) Labs Reviewed  CBG MONITORING, ED - Abnormal; Notable for the following components:      Result Value   Glucose-Capillary 260 (*)    All other components within normal limits  RESP PANEL BY RT-PCR (RSV, FLU A&B, COVID)  RVPGX2  CBC WITH DIFFERENTIAL/PLATELET  COMPREHENSIVE METABOLIC PANEL WITH GFR  LIPASE, BLOOD  URINALYSIS, ROUTINE W REFLEX MICROSCOPIC  POC URINE PREG, ED  TROPONIN T, HIGH SENSITIVITY    EKG: None  Radiology: No results found.   Procedures   Medications Ordered in the ED  sodium chloride  0.9 % bolus 1,000 mL (has no administration in time range)                                    Medical Decision Making Amount and/or Complexity of Data Reviewed Labs: ordered. Radiology: ordered.   33 yo F with a chief complaints of not feeling  well and hyperglycemia.  She has been struggling this off and on for some time.  Patient is well-known to this emergency department with 19 visits in the past 6 months and a care plan.  She is complaining of persistent hypoglycemia.  Will screen for possible infectious causes.  Give bolus of IV fluids.  Lab work to screen for diabetic ketoacidosis.  Patient had gotten bus passes and a sandwich and then left immediately afterwards.  Did not wait for labs or IV fluids.  3:29 PM:  I have discussed the diagnosis/risks/treatment options with the patient.  Evaluation and diagnostic testing in the emergency department does not suggest an emergent condition requiring admission or immediate intervention beyond what has been performed at this time.  They will follow up with PCP. We also discussed returning to the ED immediately if new or worsening sx occur. We discussed the sx which are most concerning (e.g., sudden worsening pain, fever, inability to tolerate by mouth) that necessitate immediate return. Medications administered to the patient during their visit and any new prescriptions provided to the patient are listed below.  Medications given during this visit Medications  sodium chloride  0.9 % bolus 1,000 mL (has no administration in time range)     The patient appears reasonably screen and/or stabilized for discharge and I doubt any other medical condition or other [facility] requiring further screening, evaluation, or treatment in the ED at this time prior to discharge.       Final diagnoses:  Hyperglycemia    ED Discharge Orders     None          Emil Share, DO 01/02/24 1529

## 2024-01-02 NOTE — ED Notes (Signed)
 Pt left because she wanted to go eat.  She and her friend ask for a bus pass and sandwiches and we told them no because they were not staying for her to get the treatment ordered.  They left approx 15:15

## 2024-01-05 ENCOUNTER — Encounter (HOSPITAL_COMMUNITY): Payer: Self-pay

## 2024-01-05 ENCOUNTER — Other Ambulatory Visit: Payer: Self-pay

## 2024-01-05 ENCOUNTER — Emergency Department (HOSPITAL_COMMUNITY)
Admission: EM | Admit: 2024-01-05 | Discharge: 2024-01-06 | Disposition: A | Discharge status: Home / Self Care | Payer: MEDICAID | Attending: Emergency Medicine | Admitting: Emergency Medicine

## 2024-01-05 DIAGNOSIS — Z79899 Other long term (current) drug therapy: Secondary | ICD-10-CM | POA: Insufficient documentation

## 2024-01-05 DIAGNOSIS — I10 Essential (primary) hypertension: Secondary | ICD-10-CM | POA: Insufficient documentation

## 2024-01-05 DIAGNOSIS — R739 Hyperglycemia, unspecified: Secondary | ICD-10-CM | POA: Diagnosis present

## 2024-01-05 DIAGNOSIS — Z7984 Long term (current) use of oral hypoglycemic drugs: Secondary | ICD-10-CM | POA: Diagnosis not present

## 2024-01-05 DIAGNOSIS — E1165 Type 2 diabetes mellitus with hyperglycemia: Secondary | ICD-10-CM | POA: Insufficient documentation

## 2024-01-05 DIAGNOSIS — Z794 Long term (current) use of insulin: Secondary | ICD-10-CM | POA: Insufficient documentation

## 2024-01-05 LAB — PREGNANCY, URINE: Preg Test, Ur: NEGATIVE

## 2024-01-05 LAB — CBG MONITORING, ED: Glucose-Capillary: 448 mg/dL — ABNORMAL HIGH (ref 70–99)

## 2024-01-05 MED ORDER — SODIUM CHLORIDE 0.9 % IV BOLUS
1000.0000 mL | Freq: Once | INTRAVENOUS | Status: AC
  Administered 2024-01-05: 1000 mL via INTRAVENOUS

## 2024-01-05 MED ORDER — ONDANSETRON HCL 4 MG/2ML IJ SOLN
4.0000 mg | Freq: Once | INTRAMUSCULAR | Status: AC
  Administered 2024-01-05: 4 mg via INTRAVENOUS
  Filled 2024-01-05: qty 2

## 2024-01-05 NOTE — ED Triage Notes (Signed)
 Pt BIB [facility] from home for hyperglycemia. Fatigue x1 week reported. Pt had UTI within 3 weeks that she reports not having resolved. ABD tenderness, urinary urgency, & pain during sex reported.  EMS 480 CBG  Current CBG 448.

## 2024-01-05 NOTE — ED Provider Notes (Signed)
 [place] EMERGENCY DEPARTMENT AT [facility] Provider Note   CSN: [id number] Arrival date & time: 01/05/24  2303     Patient presents with: Hyperglycemia, Abdominal Pain, and Urinary Frequency   [name] is a 33 y.o. female.  {Add pertinent medical, surgical, social history, OB history to HPI:32947} HPI     This is a [age] year old female with history of diabetes who presents with concern for persistent hyperglycemia.  Reports that her blood sugars have been running in the 400-500 range.  She states that she is taking her insulin  as directed and has followed up with her doctor and is waiting on a sliding scale to be given to her.  She reports persistent dysuria and suprapubic discomfort and believes she has a UTI.  No fevers or back pain.  Reports nausea and vomiting.  Just seen and evaluated several days ago for hyperglycemia as well.  Left prior to full evaluation.  Prior to Admission medications   Medication Sig Start Date End Date Taking? Authorizing Provider  amLODipine  (NORVASC ) 10 MG tablet Take 1 tablet (10 mg total) by mouth daily. 04/13/23   Rollene Katz, MD  atorvastatin  (LIPITOR) 20 MG tablet Take 1 tablet (20 mg total) by mouth at bedtime. 04/13/23   Rollene Katz, MD  benztropine  (COGENTIN ) 0.5 MG tablet Take 1 tablet (0.5 mg total) by mouth 2 (two) times daily. For eps 04/13/23   Rollene Katz, MD  cephALEXin  (KEFLEX ) 500 MG capsule Take 1 capsule (500 mg total) by mouth 2 (two) times daily. 12/19/23   Keith, Kayla N, PA-C  cloNIDine  (CATAPRES ) 0.1 MG tablet Take 1 tablet (0.1 mg total) by mouth daily. 04/13/23   Rollene Katz, MD  diclofenac  Sodium (VOLTAREN ) 1 % GEL Apply 4 g topically 4 (four) times daily. Patient not taking: Reported on 11/17/2023 04/13/23   Rollene Katz, MD  gabapentin  (NEURONTIN ) 100 MG capsule Take 1 capsule (100 mg total) by mouth 3 (three) times daily. For agitation 04/13/23   Rollene Katz, MD   gabapentin  (NEURONTIN ) 300 MG capsule Take 300 mg by mouth 3 (three) times daily.    [provider]  glimepiride  (AMARYL ) 4 MG tablet Take 1 tablet (4 mg total) by mouth daily. 04/13/23   Rollene Katz, MD  GLOBAL EASE INJECT PEN NEEDLES 32G X 4 MM MISC Inject 1 each into the skin as directed. 10/20/22   [provider]  haloperidol  (HALDOL ) 10 MG tablet Take 1 tablet (10 mg total) by mouth at bedtime for 14 days. 04/13/23 11/17/23  Rollene Katz, MD  insulin  aspart protamine- aspart (NOVOLOG  MIX 70/30) (70-30) 100 UNIT/ML injection Inject 20 Units into the skin every evening.    [provider]  INVEGA  SUSTENNA 156 MG/ML SUSY injection SMARTSIG:1 IM Once 11/26/23   [provider]  lithium  carbonate (ESKALITH ) 450 MG ER tablet Take 1 tablet (450 mg total) by mouth at bedtime. For mood stabilization 04/13/23   Rollene Katz, MD  loperamide  (IMODIUM ) 2 MG capsule Take 1 capsule (2 mg total) by mouth 4 (four) times daily as needed for diarrhea or loose stools. 09/10/23   Emil Share, DO  metoprolol  tartrate (LOPRESSOR ) 25 MG tablet Take 1 tablet (25 mg total) by mouth 2 (two) times daily for 14 days. 04/13/23 11/17/23  Rollene Katz, MD  ondansetron  (ZOFRAN -ODT) 4 MG disintegrating tablet 4mg  ODT q4 hours prn nausea/vomit Patient taking differently: Take 4 mg by mouth every 4 (four) hours as needed for nausea or vomiting. 09/10/23  Emil Share, DO  traZODone  (DESYREL ) 150 MG tablet Take 1 tablet (150 mg total) by mouth at bedtime. 04/13/23   Rollene Katz, MD  venlafaxine  XR (EFFEXOR -XR) 150 MG 24 hr capsule Take 1 capsule (150 mg total) by mouth daily with breakfast. 04/13/23   Rollene Katz, MD    Allergies: Wellbutrin  [bupropion ], Omnipaque  [iohexol ], Penicillins, Atarax  [hydroxyzine ], Buspar  [buspirone ], Contrast media [iodinated contrast media], and Cymbalta  [duloxetine  hcl]    Review of Systems  Constitutional:  Positive for fatigue.  Negative for fever.  Respiratory:  Negative for shortness of breath.   Cardiovascular:  Negative for chest pain.  Gastrointestinal:  Positive for nausea and vomiting. Negative for abdominal pain.  Genitourinary:  Positive for dysuria.  All other systems reviewed and are negative.   Updated Vital Signs BP (!) 152/91   Pulse (!) 105   Temp 98.7 F (37.1 C) (Oral)   Resp 17   Ht 1.702 m (5' 7)   Wt 122.5 kg   LMP 12/13/2023 (Approximate)   SpO2 99%   BMI 42.30 kg/m   Physical Exam Vitals and nursing note reviewed.  Constitutional:      Appearance: She is well-developed. She is obese. She is not ill-appearing.  HENT:     Head: Normocephalic and atraumatic.     Mouth/Throat:     Mouth: Mucous membranes are moist.  Eyes:     Pupils: Pupils are equal, round, and reactive to light.  Cardiovascular:     Rate and Rhythm: Normal rate and regular rhythm.     Heart sounds: Normal heart sounds.  Pulmonary:     Effort: Pulmonary effort is normal. No respiratory distress.     Breath sounds: No wheezing.  Abdominal:     General: Bowel sounds are normal.     Palpations: Abdomen is soft.     Tenderness: There is no abdominal tenderness. There is no guarding or rebound.  Musculoskeletal:     Cervical back: Neck supple.  Skin:    General: Skin is warm and dry.  Neurological:     General: No focal deficit present.     Mental Status: She is alert and oriented to person, place, and time.     (all labs ordered are listed, but only abnormal results are displayed) Labs Reviewed  CBG MONITORING, ED - Abnormal; Notable for the following components:      Result Value   Glucose-Capillary 448 (*)    All other components within normal limits  CBC WITH DIFFERENTIAL/PLATELET  COMPREHENSIVE METABOLIC PANEL WITH GFR  URINALYSIS, ROUTINE W REFLEX MICROSCOPIC  PREGNANCY, URINE  I-STAT CHEM 8, ED    EKG: None  Radiology: No results found.  {Document cardiac monitor, telemetry  assessment procedure when appropriate:32947} Procedures   Medications Ordered in the ED  sodium chloride  0.9 % bolus 1,000 mL (has no administration in time range)  ondansetron  (ZOFRAN ) injection 4 mg (has no administration in time range)      {Click here for ABCD2, HEART and other calculators REFRESH Note before signing:1}                              Medical Decision Making Amount and/or Complexity of Data Reviewed Labs: ordered.  Risk Prescription drug management.   ***  {Document critical care time when appropriate  Document review of labs and clinical decision tools ie CHADS2VASC2, etc  Document your independent review of radiology images and any outside records  Document your discussion with family members, caretakers and with consultants  Document social determinants of health affecting pt's care  Document your decision making why or why not admission, treatments were needed:32947:::1}   Final diagnoses:  None    ED Discharge Orders     None

## 2024-01-06 LAB — I-STAT CHEM 8, ED
BUN: 12 mg/dL (ref 6–20)
Calcium, Ion: 1.22 mmol/L (ref 1.15–1.40)
Chloride: 101 mmol/L (ref 98–111)
Creatinine, Ser: 0.7 mg/dL (ref 0.44–1.00)
Glucose, Bld: 484 mg/dL — ABNORMAL HIGH (ref 70–99)
HCT: 41 % (ref 36.0–46.0)
Hemoglobin: 13.9 g/dL (ref 12.0–15.0)
Potassium: 3.7 mmol/L (ref 3.5–5.1)
Sodium: 137 mmol/L (ref 135–145)
TCO2: 23 mmol/L (ref 22–32)

## 2024-01-06 LAB — COMPREHENSIVE METABOLIC PANEL WITH GFR
ALT: 31 U/L (ref 0–44)
AST: 32 U/L (ref 15–41)
Albumin: 4.6 g/dL (ref 3.5–5.0)
Alkaline Phosphatase: 74 U/L (ref 38–126)
Anion gap: 15 (ref 5–15)
BUN: 12 mg/dL (ref 6–20)
CO2: 21 mmol/L — ABNORMAL LOW (ref 22–32)
Calcium: 9.7 mg/dL (ref 8.9–10.3)
Chloride: 97 mmol/L — ABNORMAL LOW (ref 98–111)
Creatinine, Ser: 0.82 mg/dL (ref 0.44–1.00)
GFR, Estimated: >60 mL/min (ref >60)
Glucose, Bld: 488 mg/dL — ABNORMAL HIGH (ref 70–99)
Potassium: 3.8 mmol/L (ref 3.5–5.1)
Sodium: 133 mmol/L — ABNORMAL LOW (ref 135–145)
Total Bilirubin: 0.4 mg/dL (ref 0.0–1.2)
Total Protein: 7.7 g/dL (ref 6.5–8.1)

## 2024-01-06 LAB — CBC WITH DIFFERENTIAL/PLATELET
Abs Immature Granulocytes: 0.03 K/uL (ref 0.00–0.07)
Basophils Absolute: 0.1 K/uL (ref 0.0–0.1)
Basophils Relative: 1 %
Eosinophils Absolute: 0.3 K/uL (ref 0.0–0.5)
Eosinophils Relative: 3 %
HCT: 38.3 % (ref 36.0–46.0)
Hemoglobin: 12.5 g/dL (ref 12.0–15.0)
Immature Granulocytes: 0 %
Lymphocytes Relative: 38 %
Lymphs Abs: 4.2 K/uL — ABNORMAL HIGH (ref 0.7–4.0)
MCH: 25.7 pg — ABNORMAL LOW (ref 26.0–34.0)
MCHC: 32.6 g/dL (ref 30.0–36.0)
MCV: 78.8 fL — ABNORMAL LOW (ref 80.0–100.0)
Monocytes Absolute: 0.7 K/uL (ref 0.1–1.0)
Monocytes Relative: 7 %
Neutro Abs: 5.7 K/uL (ref 1.7–7.7)
Neutrophils Relative %: 51 %
Platelets: 258 K/uL (ref 150–400)
RBC: 4.86 MIL/uL (ref 3.87–5.11)
RDW: 14.2 % (ref 11.5–15.5)
WBC: 11 K/uL — ABNORMAL HIGH (ref 4.0–10.5)
nRBC: 0 % (ref 0.0–0.2)

## 2024-01-06 LAB — URINALYSIS, ROUTINE W REFLEX MICROSCOPIC
Bacteria, UA: NONE SEEN
Bilirubin Urine: NEGATIVE
Glucose, UA: >=500 mg/dL — AB
Ketones, ur: NEGATIVE mg/dL
Leukocytes,Ua: NEGATIVE
Nitrite: NEGATIVE
Protein, ur: NEGATIVE mg/dL
Specific Gravity, Urine: 1.026 (ref 1.005–1.030)
pH: 5 (ref 5.0–8.0)

## 2024-01-06 LAB — CBG MONITORING, ED: Glucose-Capillary: 373 mg/dL — ABNORMAL HIGH (ref 70–99)

## 2024-01-06 MED ORDER — INSULIN ASPART PROT & ASPART (70-30 MIX) 100 UNIT/ML ~~LOC~~ SUSP
20.0000 [IU] | Freq: Once | SUBCUTANEOUS | Status: AC
  Administered 2024-01-06: 20 [IU] via SUBCUTANEOUS
  Filled 2024-01-06: qty 10

## 2024-01-06 NOTE — Discharge Instructions (Signed)
 You were seen today for ongoing high blood sugars.  Continue your insulin  at home as directed.  Follow-up closely with your primary doctor for adjustments in your medications.

## 2024-01-16 ENCOUNTER — Encounter (HOSPITAL_COMMUNITY): Payer: Self-pay

## 2024-01-16 ENCOUNTER — Other Ambulatory Visit: Payer: Self-pay

## 2024-01-16 ENCOUNTER — Emergency Department (HOSPITAL_COMMUNITY)
Admission: EM | Admit: 2024-01-16 | Discharge: 2024-01-16 | Discharge status: Against Medical Advice | Payer: MEDICAID | Attending: Emergency Medicine | Admitting: Emergency Medicine

## 2024-01-16 DIAGNOSIS — E1165 Type 2 diabetes mellitus with hyperglycemia: Secondary | ICD-10-CM | POA: Insufficient documentation

## 2024-01-16 DIAGNOSIS — Z5321 Procedure and treatment not carried out due to patient leaving prior to being seen by health care provider: Secondary | ICD-10-CM | POA: Diagnosis not present

## 2024-01-16 LAB — CBC
HCT: 38.4 % (ref 36.0–46.0)
Hemoglobin: 11.8 g/dL — ABNORMAL LOW (ref 12.0–15.0)
MCH: 24.3 pg — ABNORMAL LOW (ref 26.0–34.0)
MCHC: 30.7 g/dL (ref 30.0–36.0)
MCV: 79 fL — ABNORMAL LOW (ref 80.0–100.0)
Platelets: 229 K/uL (ref 150–400)
RBC: 4.86 MIL/uL (ref 3.87–5.11)
RDW: 14.5 % (ref 11.5–15.5)
WBC: 9.4 K/uL (ref 4.0–10.5)
nRBC: 0 % (ref 0.0–0.2)

## 2024-01-16 LAB — COMPREHENSIVE METABOLIC PANEL WITH GFR
ALT: 26 U/L (ref 0–44)
AST: 28 U/L (ref 15–41)
Albumin: 4.2 g/dL (ref 3.5–5.0)
Alkaline Phosphatase: 63 U/L (ref 38–126)
Anion gap: 16 — ABNORMAL HIGH (ref 5–15)
BUN: 11 mg/dL (ref 6–20)
CO2: 21 mmol/L — ABNORMAL LOW (ref 22–32)
Calcium: 9.6 mg/dL (ref 8.9–10.3)
Chloride: 100 mmol/L (ref 98–111)
Creatinine, Ser: 0.84 mg/dL (ref 0.44–1.00)
GFR, Estimated: >60 mL/min (ref >60)
Glucose, Bld: 476 mg/dL — ABNORMAL HIGH (ref 70–99)
Potassium: 3.9 mmol/L (ref 3.5–5.1)
Sodium: 137 mmol/L (ref 135–145)
Total Bilirubin: 0.3 mg/dL (ref 0.0–1.2)
Total Protein: 7 g/dL (ref 6.5–8.1)

## 2024-01-16 LAB — URINALYSIS, ROUTINE W REFLEX MICROSCOPIC
Bacteria, UA: NONE SEEN
Bilirubin Urine: NEGATIVE
Glucose, UA: >=500 mg/dL — AB
Hgb urine dipstick: NEGATIVE
Ketones, ur: NEGATIVE mg/dL
Leukocytes,Ua: NEGATIVE
Nitrite: NEGATIVE
Protein, ur: NEGATIVE mg/dL
Specific Gravity, Urine: 1.026 (ref 1.005–1.030)
pH: 5 (ref 5.0–8.0)

## 2024-01-16 LAB — CBG MONITORING, ED: Glucose-Capillary: 494 mg/dL — ABNORMAL HIGH (ref 70–99)

## 2024-01-16 LAB — HCG, SERUM, QUALITATIVE: Preg, Serum: NEGATIVE

## 2024-01-16 NOTE — ED Triage Notes (Signed)
 Pt presents via POV c/o hyperglycemia. Reports hx of DM and compliance with current insulin  regimen. Reports medications recently increased however reports CBGs in 500s at home. A&O x4. Ambulatory to triage. Denies pain.

## 2024-01-16 NOTE — ED Notes (Signed)
 Pt requested for IV to be removed and to leave AMA.

## 2024-01-17 ENCOUNTER — Other Ambulatory Visit: Payer: Self-pay

## 2024-01-17 ENCOUNTER — Emergency Department (HOSPITAL_COMMUNITY)
Admission: EM | Admit: 2024-01-17 | Discharge: 2024-01-18 | Disposition: A | Discharge status: Home / Self Care | Payer: MEDICAID | Attending: Emergency Medicine | Admitting: Emergency Medicine

## 2024-01-17 DIAGNOSIS — R739 Hyperglycemia, unspecified: Secondary | ICD-10-CM | POA: Diagnosis present

## 2024-01-17 DIAGNOSIS — Z794 Long term (current) use of insulin: Secondary | ICD-10-CM | POA: Diagnosis not present

## 2024-01-17 LAB — I-STAT CHEM 8, ED
BUN: 10 mg/dL (ref 6–20)
Calcium, Ion: 1.16 mmol/L (ref 1.15–1.40)
Chloride: 103 mmol/L (ref 98–111)
Creatinine, Ser: 0.8 mg/dL (ref 0.44–1.00)
Glucose, Bld: 438 mg/dL — ABNORMAL HIGH (ref 70–99)
HCT: 42 % (ref 36.0–46.0)
Hemoglobin: 14.3 g/dL (ref 12.0–15.0)
Potassium: 4.1 mmol/L (ref 3.5–5.1)
Sodium: 136 mmol/L (ref 135–145)
TCO2: 24 mmol/L (ref 22–32)

## 2024-01-17 LAB — CBG MONITORING, ED: Glucose-Capillary: 455 mg/dL — ABNORMAL HIGH (ref 70–99)

## 2024-01-17 NOTE — ED Triage Notes (Signed)
 Pt says that her sugars have been running higher 575-600, I feel really tired and my breathing is heavy. She is falling asleep easily in triage, awakens to voice.

## 2024-01-18 LAB — URINALYSIS, ROUTINE W REFLEX MICROSCOPIC
Bacteria, UA: NONE SEEN
Bilirubin Urine: NEGATIVE
Glucose, UA: >=500 mg/dL — AB
Hgb urine dipstick: NEGATIVE
Ketones, ur: NEGATIVE mg/dL
Nitrite: NEGATIVE
Protein, ur: NEGATIVE mg/dL
Specific Gravity, Urine: 1.029 (ref 1.005–1.030)
pH: 5 (ref 5.0–8.0)

## 2024-01-18 LAB — BLOOD GAS, VENOUS
Acid-Base Excess: 2 mmol/L (ref 0.0–2.0)
Bicarbonate: 27.8 mmol/L (ref 20.0–28.0)
O2 Saturation: 83.2 %
Patient temperature: 37
pCO2, Ven: 47 mmHg (ref 44–60)
pH, Ven: 7.38 (ref 7.25–7.43)
pO2, Ven: 45 mmHg (ref 32–45)

## 2024-01-18 LAB — CBC
HCT: 41.1 % (ref 36.0–46.0)
Hemoglobin: 12.6 g/dL (ref 12.0–15.0)
MCH: 24.3 pg — ABNORMAL LOW (ref 26.0–34.0)
MCHC: 30.7 g/dL (ref 30.0–36.0)
MCV: 79.3 fL — ABNORMAL LOW (ref 80.0–100.0)
Platelets: 220 K/uL (ref 150–400)
RBC: 5.18 MIL/uL — ABNORMAL HIGH (ref 3.87–5.11)
RDW: 14.5 % (ref 11.5–15.5)
WBC: 8.7 K/uL (ref 4.0–10.5)
nRBC: 0 % (ref 0.0–0.2)

## 2024-01-18 LAB — CBG MONITORING, ED: Glucose-Capillary: 358 mg/dL — ABNORMAL HIGH (ref 70–99)

## 2024-01-18 LAB — HCG, SERUM, QUALITATIVE: Preg, Serum: NEGATIVE

## 2024-01-18 MED ORDER — SODIUM CHLORIDE 0.9 % IV BOLUS: 1000.0000 mL | Freq: Once | INTRAVENOUS | Status: DC

## 2024-01-18 MED ORDER — ONDANSETRON HCL 4 MG/2ML IJ SOLN
4.0000 mg | Freq: Once | INTRAMUSCULAR | Status: DC
  Administered 2024-01-18: 4 mg via INTRAVENOUS
  Filled 2024-01-18: qty 2

## 2024-01-18 MED ORDER — INSULIN ASPART PROT & ASPART (70-30 MIX) 100 UNIT/ML ~~LOC~~ SUSP
2.0000 [IU] | Freq: Once | SUBCUTANEOUS | Status: AC
  Administered 2024-01-18: 2 [IU] via SUBCUTANEOUS
  Filled 2024-01-18: qty 10

## 2024-01-18 MED ORDER — INSULIN ASPART 100 UNIT/ML IJ SOLN
8.0000 [IU] | Freq: Once | INTRAMUSCULAR | Status: AC
  Administered 2024-01-18: 8 [IU] via SUBCUTANEOUS
  Filled 2024-01-18: qty 0.08

## 2024-01-18 MED ORDER — ONDANSETRON 4 MG PO TBDP: 4.0000 mg | ORAL_TABLET | Freq: Once | ORAL | Status: DC

## 2024-01-18 NOTE — Discharge Instructions (Addendum)
 Today you were seen for hyperglycemia.  Please follow-up with your primary care for further evaluation and medication adjustments.  Please return to the ED if you have worsening symptoms.  Thank you for letting us  treat you today. After reviewing your labs, I feel you are safe to go home. Please follow up with your PCP in the next several days and provide them with your records from this visit. Return to the Emergency Room if pain becomes severe or symptoms worsen.

## 2024-01-18 NOTE — ED Provider Notes (Signed)
 [place] EMERGENCY DEPARTMENT AT [facility] Provider Note   CSN: [id number] Arrival date & time: 01/17/24  2251     Patient presents with: Hyperglycemia   [name] is a 33 y.o. female presents today for hyperglycemia.  Patient reports polydipsia, polyuria, and mild nausea.  Patient denies fever, chills, vomiting, fever, shortness of breath, chest pain, any other complaints at this time.  Patient has been seen 5 other times in the last month for same and has never been found to be in DKA.    Hyperglycemia      Prior to Admission medications   Medication Sig Start Date End Date Taking? Authorizing Provider  amLODipine  (NORVASC ) 10 MG tablet Take 1 tablet (10 mg total) by mouth daily. 04/13/23   Rollene Katz, MD  atorvastatin  (LIPITOR) 20 MG tablet Take 1 tablet (20 mg total) by mouth at bedtime. 04/13/23   Rollene Katz, MD  benztropine  (COGENTIN ) 0.5 MG tablet Take 1 tablet (0.5 mg total) by mouth 2 (two) times daily. For eps 04/13/23   Rollene Katz, MD  cephALEXin  (KEFLEX ) 500 MG capsule Take 1 capsule (500 mg total) by mouth 2 (two) times daily. 12/19/23   Janyth Riera N, PA-C  cloNIDine  (CATAPRES ) 0.1 MG tablet Take 1 tablet (0.1 mg total) by mouth daily. 04/13/23   Rollene Katz, MD  diclofenac  Sodium (VOLTAREN ) 1 % GEL Apply 4 g topically 4 (four) times daily. Patient not taking: Reported on 11/17/2023 04/13/23   Rollene Katz, MD  gabapentin  (NEURONTIN ) 100 MG capsule Take 1 capsule (100 mg total) by mouth 3 (three) times daily. For agitation 04/13/23   Rollene Katz, MD  gabapentin  (NEURONTIN ) 300 MG capsule Take 300 mg by mouth 3 (three) times daily.    [provider]  glimepiride  (AMARYL ) 4 MG tablet Take 1 tablet (4 mg total) by mouth daily. 04/13/23   Rollene Katz, MD  GLOBAL EASE INJECT PEN NEEDLES 32G X 4 MM MISC Inject 1 each into the skin as directed. 10/20/22   [provider]  haloperidol   (HALDOL ) 10 MG tablet Take 1 tablet (10 mg total) by mouth at bedtime for 14 days. 04/13/23 11/17/23  Rollene Katz, MD  insulin  aspart protamine- aspart (NOVOLOG  MIX 70/30) (70-30) 100 UNIT/ML injection Inject 20 Units into the skin every evening.    [provider]  INVEGA  SUSTENNA 156 MG/ML SUSY injection SMARTSIG:1 IM Once 11/26/23   [provider]  lithium  carbonate (ESKALITH ) 450 MG ER tablet Take 1 tablet (450 mg total) by mouth at bedtime. For mood stabilization 04/13/23   Rollene Katz, MD  loperamide  (IMODIUM ) 2 MG capsule Take 1 capsule (2 mg total) by mouth 4 (four) times daily as needed for diarrhea or loose stools. 09/10/23   Emil Share, DO  metoprolol  tartrate (LOPRESSOR ) 25 MG tablet Take 1 tablet (25 mg total) by mouth 2 (two) times daily for 14 days. 04/13/23 11/17/23  Rollene Katz, MD  ondansetron  (ZOFRAN -ODT) 4 MG disintegrating tablet 4mg  ODT q4 hours prn nausea/vomit Patient taking differently: Take 4 mg by mouth every 4 (four) hours as needed for nausea or vomiting. 09/10/23   Emil Share, DO  traZODone  (DESYREL ) 150 MG tablet Take 1 tablet (150 mg total) by mouth at bedtime. 04/13/23   Rollene Katz, MD  venlafaxine  XR (EFFEXOR -XR) 150 MG 24 hr capsule Take 1 capsule (150 mg total) by mouth daily with breakfast. 04/13/23   Rollene Katz, MD    Allergies: Wellbutrin  Dallie ], Omnipaque  [iohexol ], Penicillins, Atarax  [  hydroxyzine ], Buspar  [buspirone ], Contrast media [iodinated contrast media], and Cymbalta  [duloxetine  hcl]    Review of Systems  Updated Vital Signs BP 123/72   Pulse 88   Temp 98.7 F (37.1 C) (Oral)   Resp 17   LMP 12/13/2023 (Approximate)   SpO2 98%   Physical Exam  (all labs ordered are listed, but only abnormal results are displayed) Labs Reviewed  CBC - Abnormal; Notable for the following components:      Result Value   RBC 5.18 (*)    MCV 79.3 (*)    MCH 24.3 (*)    All other components within normal  limits  URINALYSIS, ROUTINE W REFLEX MICROSCOPIC - Abnormal; Notable for the following components:   Color, Urine STRAW (*)    Glucose, UA >=500 (*)    Leukocytes,Ua TRACE (*)    All other components within normal limits  CBG MONITORING, ED - Abnormal; Notable for the following components:   Glucose-Capillary 455 (*)    All other components within normal limits  CBG MONITORING, ED - Abnormal; Notable for the following components:   Glucose-Capillary 358 (*)    All other components within normal limits  I-STAT CHEM 8, ED - Abnormal; Notable for the following components:   Glucose, Bld 438 (*)    All other components within normal limits  HCG, SERUM, QUALITATIVE  BLOOD GAS, VENOUS  CBG MONITORING, ED    EKG: None  Radiology: No results found.   Procedures   Medications Ordered in the ED  ondansetron  (ZOFRAN -ODT) disintegrating tablet 4 mg (4 mg Oral Not Given 01/18/24 0118)  insulin  aspart protamine- aspart (NOVOLOG  MIX 70/30) injection 2 Units (has no administration in time range)  insulin  aspart (novoLOG ) injection 8 Units (8 Units Subcutaneous Given 01/18/24 0116)                                    Medical Decision Making Amount and/or Complexity of Data Reviewed Labs: ordered.  Risk Prescription drug management.   This patient presents to the ED for concern of hyperglycemia differential diagnosis includes DKA, hyperglycemia, HHS   Lab Tests:  I Ordered, and personally interpreted labs.  The pertinent results include: elevated glucose at 438, anion gap 9, mild leukocytosis at 11.0 without left shift, UA with greater than 500 glucose, small hemoglobin, trace leukocytes, no ketones, negative pregnancy  Medicines ordered and prescription drug management:  I ordered medication including Zofran , subcu insulin     I have reviewed the patients home medicines and have made adjustments as needed   Problem List / ED Course:  Patient's repeat glucose 358, 2 more units  subcu insulin  administered.  Patient able to tolerate p.o. intake without issue prior to discharge.  Patient has an anion gap of 9, no acidosis, and no ketones in urine.  Patient given return precautions.  I feel patient is safe for discharge at this time.       Final diagnoses:  Hyperglycemia    ED Discharge Orders     None          Dick Hark N, PA-C 01/18/24 0252    Palumbo, April, MD 01/18/24 [phone]

## 2024-01-25 ENCOUNTER — Emergency Department (HOSPITAL_COMMUNITY)
Admission: EM | Admit: 2024-01-25 | Discharge: 2024-01-25 | Discharge status: Against Medical Advice | Payer: MEDICAID | Attending: Emergency Medicine | Admitting: Emergency Medicine

## 2024-01-25 ENCOUNTER — Encounter (HOSPITAL_COMMUNITY): Payer: Self-pay | Admitting: Emergency Medicine

## 2024-01-25 DIAGNOSIS — Z5321 Procedure and treatment not carried out due to patient leaving prior to being seen by health care provider: Secondary | ICD-10-CM | POA: Diagnosis not present

## 2024-01-25 DIAGNOSIS — R739 Hyperglycemia, unspecified: Secondary | ICD-10-CM | POA: Diagnosis present

## 2024-01-25 DIAGNOSIS — R350 Frequency of micturition: Secondary | ICD-10-CM | POA: Diagnosis not present

## 2024-01-25 LAB — BASIC METABOLIC PANEL WITH GFR
Anion gap: 14 (ref 5–15)
BUN: 11 mg/dL (ref 6–20)
CO2: 20 mmol/L — ABNORMAL LOW (ref 22–32)
Calcium: 9.3 mg/dL (ref 8.9–10.3)
Chloride: 104 mmol/L (ref 98–111)
Creatinine, Ser: 0.82 mg/dL (ref 0.44–1.00)
GFR, Estimated: >60 mL/min (ref >60)
Glucose, Bld: 370 mg/dL — ABNORMAL HIGH (ref 70–99)
Potassium: 3.9 mmol/L (ref 3.5–5.1)
Sodium: 137 mmol/L (ref 135–145)

## 2024-01-25 LAB — URINALYSIS, ROUTINE W REFLEX MICROSCOPIC
Bacteria, UA: NONE SEEN
Bilirubin Urine: NEGATIVE
Glucose, UA: >=500 mg/dL — AB
Ketones, ur: NEGATIVE mg/dL
Leukocytes,Ua: NEGATIVE
Nitrite: NEGATIVE
Protein, ur: NEGATIVE mg/dL
Specific Gravity, Urine: 1.015 (ref 1.005–1.030)
pH: 5 (ref 5.0–8.0)

## 2024-01-25 NOTE — ED Notes (Signed)
 Pt requesting to leave AMA. Pt encouraged to stay however she states she is leaving.

## 2024-01-25 NOTE — ED Triage Notes (Addendum)
 Pt arrives w/ [facility]. Pt reports blood sugar of 340s. States she has frequent urination.

## 2024-02-04 ENCOUNTER — Emergency Department (HOSPITAL_COMMUNITY)
Admission: EM | Admit: 2024-02-04 | Discharge: 2024-02-04 | Disposition: A | Discharge status: Home / Self Care | Payer: MEDICAID | Attending: Emergency Medicine | Admitting: Emergency Medicine

## 2024-02-04 DIAGNOSIS — B3731 Acute candidiasis of vulva and vagina: Secondary | ICD-10-CM | POA: Insufficient documentation

## 2024-02-04 DIAGNOSIS — A5901 Trichomonal vulvovaginitis: Secondary | ICD-10-CM | POA: Diagnosis not present

## 2024-02-04 DIAGNOSIS — N898 Other specified noninflammatory disorders of vagina: Secondary | ICD-10-CM | POA: Diagnosis present

## 2024-02-04 LAB — URINALYSIS, ROUTINE W REFLEX MICROSCOPIC
Bacteria, UA: NONE SEEN
Bilirubin Urine: NEGATIVE
Glucose, UA: >=500 mg/dL — AB
Hgb urine dipstick: NEGATIVE
Ketones, ur: NEGATIVE mg/dL
Leukocytes,Ua: NEGATIVE
Nitrite: NEGATIVE
Protein, ur: NEGATIVE mg/dL
Specific Gravity, Urine: 1.036 — ABNORMAL HIGH (ref 1.005–1.030)
pH: 5 (ref 5.0–8.0)

## 2024-02-04 LAB — WET PREP, GENITAL
Clue Cells Wet Prep HPF POC: NONE SEEN
Sperm: NONE SEEN
WBC, Wet Prep HPF POC: <10 (ref <10)
Yeast Wet Prep HPF POC: NONE SEEN

## 2024-02-04 LAB — CBG MONITORING, ED: Glucose-Capillary: 322 mg/dL — ABNORMAL HIGH (ref 70–99)

## 2024-02-04 MED ORDER — METRONIDAZOLE 500 MG PO TABS: 500.0000 mg | ORAL_TABLET | Freq: Two times a day (BID) | ORAL | 0 refills | Status: DC

## 2024-02-04 MED ORDER — METRONIDAZOLE 500 MG PO TABS
500.0000 mg | ORAL_TABLET | Freq: Once | ORAL | Status: AC
  Administered 2024-02-04: 500 mg via ORAL
  Filled 2024-02-04: qty 1

## 2024-02-04 MED ORDER — FLUCONAZOLE 150 MG PO TABS
150.0000 mg | ORAL_TABLET | Freq: Once | ORAL | Status: AC
  Administered 2024-02-04: 150 mg via ORAL
  Filled 2024-02-04: qty 1

## 2024-02-04 NOTE — ED Provider Notes (Signed)
 [place] EMERGENCY DEPARTMENT AT [facility] Provider Note   CSN: [id number] Arrival date & time: 02/04/24  1124     Patient presents with: Vaginal Itching   [name] is a 33 y.o. female who presents to the ED today with concern for a burning/itching sensation on the vulva over the last several days.  Describes having a thick white discharge external genitalia, also endorses intense itching along with this, denies having any foul smelling discharge, denies any vaginal bleeding.  Denies any pelvic pain.  Is concerned that she may have a trichomonal infection as this has happened in the past.  Denies having any nausea/vomiting, endorses normal appetite, denies any body aches, chills, fevers.    Vaginal Itching       Prior to Admission medications   Medication Sig Start Date End Date Taking? Authorizing Provider  metroNIDAZOLE  (FLAGYL ) 500 MG tablet Take 1 tablet (500 mg total) by mouth 2 (two) times daily. 02/04/24  Yes Myriam Dorn BROCKS, PA  amLODipine  (NORVASC ) 10 MG tablet Take 1 tablet (10 mg total) by mouth daily. 04/13/23   Rollene Katz, MD  atorvastatin  (LIPITOR) 20 MG tablet Take 1 tablet (20 mg total) by mouth at bedtime. 04/13/23   Rollene Katz, MD  benztropine  (COGENTIN ) 0.5 MG tablet Take 1 tablet (0.5 mg total) by mouth 2 (two) times daily. For eps 04/13/23   Rollene Katz, MD  cephALEXin  (KEFLEX ) 500 MG capsule Take 1 capsule (500 mg total) by mouth 2 (two) times daily. 12/19/23   Keith, Kayla N, PA-C  cloNIDine  (CATAPRES ) 0.1 MG tablet Take 1 tablet (0.1 mg total) by mouth daily. 04/13/23   Rollene Katz, MD  diclofenac  Sodium (VOLTAREN ) 1 % GEL Apply 4 g topically 4 (four) times daily. Patient not taking: Reported on 11/17/2023 04/13/23   Rollene Katz, MD  gabapentin  (NEURONTIN ) 100 MG capsule Take 1 capsule (100 mg total) by mouth 3 (three) times daily. For agitation 04/13/23   Rollene Katz, MD  gabapentin   (NEURONTIN ) 300 MG capsule Take 300 mg by mouth 3 (three) times daily.    [provider]  glimepiride  (AMARYL ) 4 MG tablet Take 1 tablet (4 mg total) by mouth daily. 04/13/23   Rollene Katz, MD  GLOBAL EASE INJECT PEN NEEDLES 32G X 4 MM MISC Inject 1 each into the skin as directed. 10/20/22   [provider]  haloperidol  (HALDOL ) 10 MG tablet Take 1 tablet (10 mg total) by mouth at bedtime for 14 days. 04/13/23 11/17/23  Rollene Katz, MD  insulin  aspart protamine- aspart (NOVOLOG  MIX 70/30) (70-30) 100 UNIT/ML injection Inject 20 Units into the skin every evening.    [provider]  INVEGA  SUSTENNA 156 MG/ML SUSY injection SMARTSIG:1 IM Once 11/26/23   [provider]  lithium  carbonate (ESKALITH ) 450 MG ER tablet Take 1 tablet (450 mg total) by mouth at bedtime. For mood stabilization 04/13/23   Rollene Katz, MD  loperamide  (IMODIUM ) 2 MG capsule Take 1 capsule (2 mg total) by mouth 4 (four) times daily as needed for diarrhea or loose stools. 09/10/23   Emil Share, DO  metoprolol  tartrate (LOPRESSOR ) 25 MG tablet Take 1 tablet (25 mg total) by mouth 2 (two) times daily for 14 days. 04/13/23 11/17/23  Rollene Katz, MD  ondansetron  (ZOFRAN -ODT) 4 MG disintegrating tablet 4mg  ODT q4 hours prn nausea/vomit Patient taking differently: Take 4 mg by mouth every 4 (four) hours as needed for nausea or vomiting. 09/10/23   Emil Share, DO  traZODone  (DESYREL ) 150 MG tablet Take 1 tablet (150 mg total) by mouth at bedtime. 04/13/23   Rollene Katz, MD  venlafaxine  XR (EFFEXOR -XR) 150 MG 24 hr capsule Take 1 capsule (150 mg total) by mouth daily with breakfast. 04/13/23   Rollene Katz, MD    Allergies: Wellbutrin  [bupropion ], Omnipaque  [iohexol ], Penicillins, Atarax  [hydroxyzine ], Buspar  [buspirone ], Contrast media [iodinated contrast media], and Cymbalta  [duloxetine  hcl]    Review of Systems  Genitourinary:  Positive for vaginal discharge.   All other systems reviewed and are negative.   Updated Vital Signs BP (!) 155/103 (BP Location: Right Arm)   Pulse 94   Temp 98.4 F (36.9 C) (Oral)   Resp 18   SpO2 99%   Physical Exam Vitals and nursing note reviewed.  Constitutional:      General: She is not in acute distress.    Appearance: She is well-developed.  HENT:     Head: Normocephalic and atraumatic.  Eyes:     Conjunctiva/sclera: Conjunctivae normal.  Cardiovascular:     Rate and Rhythm: Normal rate and regular rhythm.     Heart sounds: Normal heart sounds. No murmur heard. Pulmonary:     Effort: Pulmonary effort is normal. No respiratory distress.     Breath sounds: Normal breath sounds and air entry.  Abdominal:     General: Abdomen is flat. Bowel sounds are normal.     Palpations: Abdomen is soft.     Tenderness: There is no abdominal tenderness.  Musculoskeletal:        General: No swelling.     Cervical back: Neck supple.  Skin:    General: Skin is warm and dry.     Capillary Refill: Capillary refill takes less than 2 seconds.  Neurological:     Mental Status: She is alert.  Psychiatric:        Mood and Affect: Mood normal.     (all labs ordered are listed, but only abnormal results are displayed) Labs Reviewed  WET PREP, GENITAL - Abnormal; Notable for the following components:      Result Value   Trich, Wet Prep PRESENT (*)    All other components within normal limits  URINALYSIS, ROUTINE W REFLEX MICROSCOPIC - Abnormal; Notable for the following components:   Specific Gravity, Urine 1.036 (*)    Glucose, UA >=500 (*)    All other components within normal limits  CBG MONITORING, ED - Abnormal; Notable for the following components:   Glucose-Capillary 322 (*)    All other components within normal limits  URINE CULTURE  GC/CHLAMYDIA PROBE AMP ([place]) NOT AT [facility]    EKG: None  Radiology: No results found.   Procedures   Medications Ordered in the ED  metroNIDAZOLE   (FLAGYL ) tablet 500 mg (500 mg Oral Given 02/04/24 1258)  fluconazole  (DIFLUCAN ) tablet 150 mg (150 mg Oral Given by Other 02/04/24 1302)                                    Medical Decision Making Amount and/or Complexity of Data Reviewed Labs: ordered.  Risk Prescription drug management.   Medical Decision Making:   [name] is a 33 y.o. female who presented to the ED today with vaginal itching and discharge detailed above.     Complete initial physical exam performed, notably the patient  was alert and oriented no apparent distress, abdomen is soft nontender nondistended.SABRA  Reviewed and confirmed nursing documentation for past medical history, family history, social history.    Initial Assessment:   With the patient's presentation of as well as the discharge, most likely diagnosis is vulvovaginal candidiasis.  Further consider trichomonal infection, bacterial vaginosis.   Initial Plan:  Obtain swab for evaluation for BV and candidiasis Offered blood tests to evaluate renal function, patient declines at this time. Urinalysis with reflex culture ordered to evaluate for UTI or relevant urologic/nephrologic pathology.  Obtain swab for evaluation of gonorrhea/chlamydia. Objective evaluation as below reviewed   Initial Study Results:   Laboratory  All laboratory results reviewed without evidence of clinically relevant pathology.   Exceptions include: Large glycosuria, greater than 500, trichomonas present on the wet prep.    Reassessment and Plan:   Given the description of symptoms, findings she is most likely also has a vulvar candidiasis, and treated with a dose of Diflucan  here in the ED.  Further began treatment with metronidazole  for trichomonas found on the wet prep.  Will also send a prescription for the same.  Discussed this with the patient, advised her to await for nursing to provide her with AVS so that she has the proper location of the prescription, she  insisted on leaving at that time stating that she needed to catch the bus.  As she has vital signs are stable within normal limits, and has no concerning signs or symptoms she is stable for discharge and outpatient management at this time.       Final diagnoses:  Vaginal yeast infection  Trichomoniasis of vagina    ED Discharge Orders          Ordered    metroNIDAZOLE  (FLAGYL ) 500 MG tablet  2 times daily        02/04/24 1255               Myriam Dorn BROCKS, [place] 02/04/24 1311    Freddi Hamilton, MD 02/07/24 1643

## 2024-02-04 NOTE — ED Triage Notes (Addendum)
 C/o vaginal itching for the last few months. Took medication without relief. Also requesting her blood sugar to be checked. Normally runs high.

## 2024-02-06 LAB — URINE CULTURE: Culture: 10000 — AB

## 2024-02-07 ENCOUNTER — Telehealth (HOSPITAL_BASED_OUTPATIENT_CLINIC_OR_DEPARTMENT_OTHER): Payer: Self-pay | Admitting: *Deleted

## 2024-02-07 LAB — GC/CHLAMYDIA PROBE AMP (~~LOC~~) NOT AT ARMC
Chlamydia: NEGATIVE
Comment: NEGATIVE
Comment: NORMAL
Neisseria Gonorrhea: NEGATIVE

## 2024-02-07 NOTE — Telephone Encounter (Signed)
 Post ED Visit - Positive Culture Follow-up  Culture report reviewed by antimicrobial stewardship pharmacist: [facility] []  Rankin Dee, Pharm.D. []  Venetia Gully, Pharm.D., BCPS AQ-ID []  Garrel Crews, Pharm.D., BCPS []  Almarie Lunger, Pharm.D., BCPS []  [employer], [address].D., BCPS, AAHIVP []  Rosaline Bihari, Pharm.D., BCPS, AAHIVP []  Vernell Meier, PharmD, BCPS []  Latanya Hint, PharmD, BCPS []  Donald Medley, PharmD, BCPS []  Rocky Bold, PharmD []  Dorothyann Alert, PharmD, BCPS []  Morene Babe, PharmD  [facility] [x]  Damien Quiet, PharmD []  Romona Bliss, PharmD []  Dolphus Roller, PharmD []  Veva Seip, Rph []  Vernell Daunt) Leonce, PharmD []  Eva Allis, PharmD []  Rosaline Millet, PharmD []  Iantha Batch, PharmD []  Arvin Gauss, PharmD []  Wanda Hasting, PharmD []  Ronal Rav, PharmD []  Rocky Slade, PharmD []  Bard Jeans, PharmD   Positive urine culture Treated with metronidazole  for trich; no treatment needed for positive urine culture d/t insignificant growth.  No further patient follow-up is required at this time per Dr. Curtistine Dawn.  Lorita Barnie Pereyra 02/07/2024, 9:32 AM

## 2024-02-07 NOTE — Progress Notes (Signed)
 ED Antimicrobial Stewardship Positive Culture Follow Up   [name] is an 33 y.o. female who presented to [facility] on 02/04/2024 with a chief complaint of  Chief Complaint  Patient presents with   Vaginal Itching    Recent Results (from the past 720 hours)  Urine Culture     Status: Abnormal   Collection Time: 02/04/24 11:54 AM   Specimen: Urine, Clean Catch  Result Value Ref Range Status   Specimen Description   Final    URINE, CLEAN CATCH Performed at [facility], [address] [place], [place] [address]    Special Requests   Final    NONE Performed at [facility], [address], [place], [place] [address]    Culture (A)  Final    10,000 COLONIES/mL GROUP B STREP(S.AGALACTIAE)ISOLATED TESTING AGAINST S. AGALACTIAE NOT ROUTINELY PERFORMED DUE TO PREDICTABILITY OF AMP/PEN/VAN SUSCEPTIBILITY. Performed at [facility], [address], [place], [place] [address]    Report Status 02/06/2024 FINAL  Final  Wet prep, genital     Status: Abnormal   Collection Time: 02/04/24 12:10 PM   Specimen: Urine, Clean Catch  Result Value Ref Range Status   Yeast Wet Prep HPF POC NONE SEEN NONE SEEN Final   Trich, Wet Prep PRESENT (A) NONE SEEN Final   Clue Cells Wet Prep HPF POC NONE SEEN NONE SEEN Final   WBC, Wet Prep HPF POC <10 <10 Final   Sperm NONE SEEN  Final    Comment: Performed at [facility], [address], [place], [place] [address]    Insignificant growth of bacteria. No urinary symptoms. No treatment warranted for urine culture.   ED Provider: Curtistine Dawn, MD   Damien Quiet, PharmD, BCPS, BCIDP Infectious Diseases Clinical Pharmacist Phone: [phone] 02/07/2024, 8:41 AM

## 2024-02-10 ENCOUNTER — Other Ambulatory Visit: Payer: Self-pay

## 2024-02-10 ENCOUNTER — Emergency Department (HOSPITAL_COMMUNITY)
Admission: EM | Admit: 2024-02-10 | Discharge: 2024-02-11 | Disposition: A | Discharge status: Home / Self Care | Payer: MEDICAID | Attending: Emergency Medicine | Admitting: Emergency Medicine

## 2024-02-10 DIAGNOSIS — F250 Schizoaffective disorder, bipolar type: Secondary | ICD-10-CM | POA: Diagnosis not present

## 2024-02-10 DIAGNOSIS — R4182 Altered mental status, unspecified: Secondary | ICD-10-CM | POA: Diagnosis not present

## 2024-02-10 DIAGNOSIS — Z79899 Other long term (current) drug therapy: Secondary | ICD-10-CM | POA: Diagnosis not present

## 2024-02-10 DIAGNOSIS — Z7984 Long term (current) use of oral hypoglycemic drugs: Secondary | ICD-10-CM | POA: Diagnosis not present

## 2024-02-10 DIAGNOSIS — E119 Type 2 diabetes mellitus without complications: Secondary | ICD-10-CM | POA: Diagnosis not present

## 2024-02-10 DIAGNOSIS — F309 Manic episode, unspecified: Secondary | ICD-10-CM | POA: Diagnosis not present

## 2024-02-10 DIAGNOSIS — Z794 Long term (current) use of insulin: Secondary | ICD-10-CM | POA: Insufficient documentation

## 2024-02-10 DIAGNOSIS — I10 Essential (primary) hypertension: Secondary | ICD-10-CM | POA: Diagnosis not present

## 2024-02-10 DIAGNOSIS — F209 Schizophrenia, unspecified: Secondary | ICD-10-CM | POA: Diagnosis present

## 2024-02-10 DIAGNOSIS — F319 Bipolar disorder, unspecified: Secondary | ICD-10-CM | POA: Diagnosis not present

## 2024-02-10 DIAGNOSIS — F3130 Bipolar disorder, current episode depressed, mild or moderate severity, unspecified: Secondary | ICD-10-CM | POA: Diagnosis present

## 2024-02-10 DIAGNOSIS — R443 Hallucinations, unspecified: Secondary | ICD-10-CM | POA: Diagnosis present

## 2024-02-10 LAB — CBC WITH DIFFERENTIAL/PLATELET
Abs Immature Granulocytes: 0.05 K/uL (ref 0.00–0.07)
Basophils Absolute: 0.1 K/uL (ref 0.0–0.1)
Basophils Relative: 1 %
Eosinophils Absolute: 0.3 K/uL (ref 0.0–0.5)
Eosinophils Relative: 3 %
HCT: 41.2 % (ref 36.0–46.0)
Hemoglobin: 12.8 g/dL (ref 12.0–15.0)
Immature Granulocytes: 1 %
Lymphocytes Relative: 36 %
Lymphs Abs: 3.6 K/uL (ref 0.7–4.0)
MCH: 24.5 pg — ABNORMAL LOW (ref 26.0–34.0)
MCHC: 31.1 g/dL (ref 30.0–36.0)
MCV: 78.9 fL — ABNORMAL LOW (ref 80.0–100.0)
Monocytes Absolute: 0.6 K/uL (ref 0.1–1.0)
Monocytes Relative: 6 %
Neutro Abs: 5.3 K/uL (ref 1.7–7.7)
Neutrophils Relative %: 53 %
Platelets: 227 K/uL (ref 150–400)
RBC: 5.22 MIL/uL — ABNORMAL HIGH (ref 3.87–5.11)
RDW: 14.7 % (ref 11.5–15.5)
WBC: 9.9 K/uL (ref 4.0–10.5)
nRBC: 0 % (ref 0.0–0.2)

## 2024-02-10 LAB — CBG MONITORING, ED: Glucose-Capillary: 268 mg/dL — ABNORMAL HIGH (ref 70–99)

## 2024-02-10 LAB — ACETAMINOPHEN LEVEL: Acetaminophen (Tylenol), Serum: <10 ug/mL — ABNORMAL LOW (ref 10–30)

## 2024-02-10 LAB — SALICYLATE LEVEL: Salicylate Lvl: <7 mg/dL — ABNORMAL LOW (ref 7.0–30.0)

## 2024-02-10 LAB — ETHANOL: Alcohol, Ethyl (B): <15 mg/dL (ref <15)

## 2024-02-10 MED ORDER — ZIPRASIDONE MESYLATE 20 MG IM SOLR: 20.0000 mg | Freq: Once | INTRAMUSCULAR | Status: DC | PRN

## 2024-02-10 NOTE — ED Provider Notes (Signed)
 [place] EMERGENCY DEPARTMENT AT [facility] Provider Note   CSN: [id number] Arrival date & time: 02/10/24  2037     Patient presents with: Altered Mental Status   [name] is a 33 y.o. female.   Patient BIB EMS from [facility]. Active hallucinations reporting to EMS she has worms crawling all over her. She also voices she is married to [employer]. Patient with Bipolar, depression, cognitive deficits, HTN, T2DM. She states her doctor recently changed her medications.   The history is provided by the patient and the EMS personnel. No language interpreter was used.  Altered Mental Status      Prior to Admission medications   Medication Sig Start Date End Date Taking? Authorizing Provider  amLODipine  (NORVASC ) 10 MG tablet Take 1 tablet (10 mg total) by mouth daily. 04/13/23   Rollene Katz, MD  atorvastatin  (LIPITOR) 20 MG tablet Take 1 tablet (20 mg total) by mouth at bedtime. 04/13/23   Rollene Katz, MD  benztropine  (COGENTIN ) 0.5 MG tablet Take 1 tablet (0.5 mg total) by mouth 2 (two) times daily. For eps 04/13/23   Rollene Katz, MD  cephALEXin  (KEFLEX ) 500 MG capsule Take 1 capsule (500 mg total) by mouth 2 (two) times daily. 12/19/23   Keith, Kayla N, PA-C  cloNIDine  (CATAPRES ) 0.1 MG tablet Take 1 tablet (0.1 mg total) by mouth daily. 04/13/23   Rollene Katz, MD  diclofenac  Sodium (VOLTAREN ) 1 % GEL Apply 4 g topically 4 (four) times daily. Patient not taking: Reported on 11/17/2023 04/13/23   Rollene Katz, MD  gabapentin  (NEURONTIN ) 100 MG capsule Take 1 capsule (100 mg total) by mouth 3 (three) times daily. For agitation 04/13/23   Rollene Katz, MD  gabapentin  (NEURONTIN ) 300 MG capsule Take 300 mg by mouth 3 (three) times daily.    [provider]  glimepiride  (AMARYL ) 4 MG tablet Take 1 tablet (4 mg total) by mouth daily. 04/13/23   Rollene Katz, MD  GLOBAL EASE INJECT PEN NEEDLES 32G X 4 MM MISC Inject 1  each into the skin as directed. 10/20/22   [provider]  haloperidol  (HALDOL ) 10 MG tablet Take 1 tablet (10 mg total) by mouth at bedtime for 14 days. 04/13/23 11/17/23  Rollene Katz, MD  insulin  aspart protamine- aspart (NOVOLOG  MIX 70/30) (70-30) 100 UNIT/ML injection Inject 20 Units into the skin every evening.    [provider]  INVEGA  SUSTENNA 156 MG/ML SUSY injection SMARTSIG:1 IM Once 11/26/23   [provider]  lithium  carbonate (ESKALITH ) 450 MG ER tablet Take 1 tablet (450 mg total) by mouth at bedtime. For mood stabilization 04/13/23   Rollene Katz, MD  loperamide  (IMODIUM ) 2 MG capsule Take 1 capsule (2 mg total) by mouth 4 (four) times daily as needed for diarrhea or loose stools. 09/10/23   Emil Share, DO  metoprolol  tartrate (LOPRESSOR ) 25 MG tablet Take 1 tablet (25 mg total) by mouth 2 (two) times daily for 14 days. 04/13/23 11/17/23  Rollene Katz, MD  metroNIDAZOLE  (FLAGYL ) 500 MG tablet Take 1 tablet (500 mg total) by mouth 2 (two) times daily. 02/04/24   Myriam Dorn BROCKS, PA  ondansetron  (ZOFRAN -ODT) 4 MG disintegrating tablet 4mg  ODT q4 hours prn nausea/vomit Patient taking differently: Take 4 mg by mouth every 4 (four) hours as needed for nausea or vomiting. 09/10/23   Emil Share, DO  traZODone  (DESYREL ) 150 MG tablet Take 1 tablet (150 mg total) by mouth at bedtime. 04/13/23   Rollene Katz,  MD  venlafaxine  XR (EFFEXOR -XR) 150 MG 24 hr capsule Take 1 capsule (150 mg total) by mouth daily with breakfast. 04/13/23   Rollene Katz, MD    Allergies: Wellbutrin  [bupropion ], Omnipaque  [iohexol ], Penicillins, Atarax  [hydroxyzine ], Buspar  [buspirone ], Contrast media [iodinated contrast media], and Cymbalta  [duloxetine  hcl]    Review of Systems  Updated Vital Signs BP (!) 139/105   Pulse (!) 103   Temp 98.5 F (36.9 C) (Oral)   Resp 18   Ht 5' 7 (1.702 m)   Wt 122.9 kg   LMP 01/11/2024   SpO2 98%   BMI 42.44 kg/m    Physical Exam Constitutional:      General: She is not in acute distress.    Appearance: She is well-developed. She is not ill-appearing.  Pulmonary:     Effort: Pulmonary effort is normal.  Musculoskeletal:        General: Normal range of motion.     Cervical back: Normal range of motion.  Skin:    General: Skin is warm and dry.  Neurological:     Mental Status: She is alert and oriented to person, place, and time.     Gait: Gait normal.  Psychiatric:        Attention and Perception: She perceives visual hallucinations.        Mood and Affect: Mood is elated.        Speech: Speech is rapid and pressured.        Behavior: Behavior is actively hallucinating. Behavior is cooperative.        Thought Content: Thought content is delusional.     (all labs ordered are listed, but only abnormal results are displayed) Labs Reviewed  CBG MONITORING, ED - Abnormal; Notable for the following components:      Result Value   Glucose-Capillary 268 (*)    All other components within normal limits  CBC WITH DIFFERENTIAL/PLATELET  SALICYLATE LEVEL  COMPREHENSIVE METABOLIC PANEL WITH GFR  ETHANOL  ACETAMINOPHEN  LEVEL  URINE DRUG SCREEN    EKG: None  Radiology: No results found.   Procedures   Medications Ordered in the ED  ziprasidone  (GEODON ) injection 20 mg (has no administration in time range)    Clinical Course as of 02/10/24 2112  Thu Feb 10, 2024  2051 Patient with history of Bipolar disorder, history of mania requiring hospitalization. She denies SI/HI today. She is speaking rapidly, loudly, makes it known she is married to [employer], exhibiting manic behavior. IVC begun, medical clearance labs pending. Geodon  IM order prn entered.  [SU]    Clinical Course User Index [SU] Stacy Balls, PA-C                                 Medical Decision Making       Final diagnoses:  Mania [facility])    ED Discharge Orders     None          [name],  [name] 02/10/24 2115    Lenor Hollering, MD 02/10/24 [phone]

## 2024-02-10 NOTE — ED Notes (Signed)
 Pt has been dressed out into burgundy scrubs. Pt have two belonging bags, one with her clothes and the other is a black back bag. Pt has been wanded by security. Pt cooperative at this time. Pt belongings are in the cabinet in triage.

## 2024-02-10 NOTE — ED Triage Notes (Signed)
 Patient to ED by EMS from [facility] for hallucinations. She voices worms crawling on her and her being married to [employer]. She also states that she was recently taken off her Prozac  and switched to an unknown medication. Denies SI/HI.

## 2024-02-11 DIAGNOSIS — F209 Schizophrenia, unspecified: Secondary | ICD-10-CM

## 2024-02-11 DIAGNOSIS — R4182 Altered mental status, unspecified: Secondary | ICD-10-CM

## 2024-02-11 DIAGNOSIS — F319 Bipolar disorder, unspecified: Secondary | ICD-10-CM

## 2024-02-11 LAB — URINE DRUG SCREEN
Amphetamines: NEGATIVE
Barbiturates: NEGATIVE
Benzodiazepines: NEGATIVE
Cocaine: NEGATIVE
Fentanyl: NEGATIVE
Methadone Scn, Ur: NEGATIVE
Opiates: NEGATIVE
Tetrahydrocannabinol: NEGATIVE

## 2024-02-11 LAB — I-STAT CHEM 8, ED
BUN: 15 mg/dL (ref 6–20)
Calcium, Ion: 1.17 mmol/L (ref 1.15–1.40)
Chloride: 101 mmol/L (ref 98–111)
Creatinine, Ser: 0.8 mg/dL (ref 0.44–1.00)
Glucose, Bld: 360 mg/dL — ABNORMAL HIGH (ref 70–99)
HCT: 38 % (ref 36.0–46.0)
Hemoglobin: 12.9 g/dL (ref 12.0–15.0)
Potassium: 3.9 mmol/L (ref 3.5–5.1)
Sodium: 137 mmol/L (ref 135–145)
TCO2: 26 mmol/L (ref 22–32)

## 2024-02-11 MED ORDER — FLUVOXAMINE MALEATE 50 MG PO TABS: 50.0000 mg | ORAL_TABLET | Freq: Every day | ORAL | 0 refills | Status: AC

## 2024-02-11 MED ORDER — GABAPENTIN 300 MG PO CAPS: 300.0000 mg | ORAL_CAPSULE | Freq: Three times a day (TID) | ORAL | 0 refills | Status: AC

## 2024-02-11 MED ORDER — CLONIDINE HCL 0.2 MG PO TABS: 0.1000 mg | ORAL_TABLET | Freq: Every evening | ORAL | 0 refills | Status: DC

## 2024-02-11 MED ORDER — OLANZAPINE 10 MG PO TABS: 30.0000 mg | ORAL_TABLET | Freq: Every day | ORAL | 0 refills | Status: DC

## 2024-02-11 MED ORDER — CLONIDINE HCL 0.1 MG PO TABS: 0.1000 mg | ORAL_TABLET | Freq: Every day | ORAL | Status: DC

## 2024-02-11 NOTE — Consult Note (Signed)
 [facility] Psychiatric Consult Initial  Patient Name: .[name]  MRN: [id number]  DOB: [birth year]  Consult Order details:  Orders (From admission, onward)     Start     Ordered   02/10/24 2204  CONSULT TO CALL ACT TEAM       Ordering Provider: Odell Balls, PA-C  Provider:  (Not yet assigned)  Question:  Reason for Consult?  Answer:  Psych consult   02/10/24 2204             Mode of Visit: In person    Psychiatry Consult Evaluation  Service Date: February 11, 2024 LOS:  LOS: 0 days  Chief Complaint I want my fluoxetine   Primary Psychiatric Diagnoses  Altered Mental Status 2.  Schizophrenia 3.  Bipolar Disorder  Assessment  [name] is a 33 y.o. female admitted: Presented to the EDfor 02/10/2024  8:40 PM for brought in by EMS from [facility] for hallucinations. She carries the psychiatric diagnoses of schizophrenia, bipolar 1 disorder, MDD, anxiety, GAD, impaired cognition and borderline intellectual functioning and has a past medical history of HTN, GERD, diabetes, neuropathy, hyperlipidemia and syphilis.   Her current presentation of calm cooperation and requesting a prescription is most consistent with effectively treated mental health. She meets criteria for outpatient psychiatric follow up based on not being a danger to herself.  Current outpatient psychotropic medications include invega  sustenna, gabapentin , olanzapine , clonidine  and fluoxamine and historically she has had a positive response to these medications. She was compliant with medications prior to admission as evidenced by patient report. On initial examination, patient is calm and cooperative and denies any acute symptoms. Please see plan below for detailed recommendations.   Diagnoses:  Active Hospital problems: Principal Problem:   Altered mental status Active Problems:   Bipolar I disorder, most recent episode depressed (HCC)   Schizophrenia (HCC)    Plan   ## Psychiatric  Medication Recommendations:  Restart home meds --clonidine  0.1mg   PO Q HS --fluvoxamine  50mg  PO Q HS --gabapentin  300mg  PO TID --olanzapine  30mg  PO Q HS --invega  sustenna 234mg  IM Q month; last injection per ACTT 01/25/2024  ## Medical Decision Making Capacity: Not specifically addressed in this encounter  ## Further Work-up:  -- most recent EKG on 02/11/2024 had QtC of 459 -- Pertinent labwork reviewed earlier this admission includes: CBC, CMP, acetaminophen , salicylate, alcohol and UDS   ## Disposition:-- There are no psychiatric contraindications to discharge at this time  ## Behavioral / Environmental: - No specific recommendations at this time.     ## Safety and Observation Level:  - Based on my clinical evaluation, I estimate the patient to be at low risk of self harm in the current setting. - At this time, we recommend  routine. This decision is based on my review of the chart including patient's history and current presentation, interview of the patient, mental status examination, and consideration of suicide risk including evaluating suicidal ideation, plan, intent, suicidal or self-harm behaviors, risk factors, and protective factors. This judgment is based on our ability to directly address suicide risk, implement suicide prevention strategies, and develop a safety plan while the patient is in the clinical setting. Please contact our team if there is a concern that risk level has changed.  CSSR Risk Category:C-SSRS RISK CATEGORY: Moderate Risk  Suicide Risk Assessment: Patient has following modifiable risk factors for suicide: none, which we are addressing by recommending patient continue to follow up with her ACTT team. Patient has following non-modifiable  or demographic risk factors for suicide: psychiatric hospitalization Patient has the following protective factors against suicide: Access to outpatient mental health care and Supportive family  Thank you for this consult  request. Recommendations have been communicated to the primary team.  We will sign off at this time.   Lorianne Malbrough A Kline, NP       History of Present Illness  Relevant Aspects of Hospital ED Course:  Admitted on 02/10/2024 for brought in by EMS from [facility] for hallucinations. She carries the psychiatric diagnoses of schizophrenia, bipolar 1 disorder, MDD, anxiety, GAD, impaired cognition and borderline intellectual functioning and has a past medical history of HTN, GERD, diabetes, neuropathy, hyperlipidemia and syphilis.   Patient Report:  [name], is seen face to face by this provider, consulted with Dr. Merilee; and chart reviewed on 02/11/24.  On evaluation [name] reports she is good and that she is here because she wants back on fluoxetine .  She denies events from previous day.  She is calm, cooperative and pleasant at this time.  She states she just wants her medication and she wants to go home.  Patient is well known to this service and has an ACTT team that she works with regularly.    A conversation with the ACTT team provider confirms patient has previously been on prozac , but that her current prescription is for luvox .  ACTT provider says they will be following up with patient soon. Patient is not a danger to herself or others and she has appropriate follow up care.  She is psych cleared.   During evaluation [name] is laying in bed in no acute distress.  She is alert & oriented x 4, calm, cooperative and attentive for this assessment.  Her mood is calm with congruent affect.  She has normal speech, and behavior.  Objectively there is no evidence of psychosis/mania or delusional thinking. Pt does not appear to be responding to internal or external stimuli.  Patient is able to converse coherently, goal directed thoughts, no distractibility, or pre-occupation. She denies suicidal/self-harm/homicidal ideation, psychosis, and paranoia.  Patient answered questions  appropriately.    I personally spent a total of 60 minutes in the care of the patient today including preparing to see the patient, getting/reviewing separately obtained history, performing a medically appropriate exam/evaluation, counseling and educating, placing orders, referring and communicating with other health care professionals, documenting clinical information in the EHR, independently interpreting results, communicating results, and coordinating care.  Psych ROS:  Depression: denies Anxiety:  denies Mania (lifetime and current): denies Psychosis: (lifetime and current): denies  Review of Systems  All other systems reviewed and are negative.    Psychiatric and Social History  Psychiatric History:  Information collected from patient and chart review  Prev Dx/Sx: schizophrenia, bipolar 1 disorder, MDD, anxiety, GAD, impaired cognition and borderline intellectual functioning Current Psych Provider: [facility] Home Meds (current): invega  sustenna, gabapentin , olanzapine , clonidine  and fluoxamine  Previous Med Trials: many Therapy: unknown  Prior Psych Hospitalization: many  Prior Self Harm: unknown Prior Violence: unknown  Family Psych History: unknown Family Hx suicide: unknown  Social History:  Developmental Hx: intellectually impaired Educational Hx: unknown Occupational Hx: disability Legal Hx: none Living Situation: lives in her own apartment Spiritual Hx: unknown Access to weapons/lethal means: denies   Substance History Alcohol: denies  Tobacco: denies Illicit drugs: denies Prescription drug abuse: denies Rehab hx: denies  Exam Findings  Physical Exam:  Vital Signs:  Temp:  [98.5  F (36.9 C)] 98.5 F (36.9 C) (10/17 0125) Pulse Rate:  [94-103] 94 (10/17 0125) Resp:  [18] 18 (10/17 0125) BP: (129-139)/(87-105) 129/87 (10/17 0125) SpO2:  [98 %-99 %] 99 % (10/17 0132) Weight:  [122.9 kg] 122.9 kg (10/16 2045) Blood pressure 129/87, pulse  94, temperature 98.5 F (36.9 C), temperature source Oral, resp. rate 18, height 5' 7 (1.702 m), weight 122.9 kg, last menstrual period 01/11/2024, SpO2 99%. Body mass index is 42.44 kg/m.  Physical Exam Constitutional:      Appearance: She is obese.  Eyes:     Pupils: Pupils are equal, round, and reactive to light.  Pulmonary:     Effort: Pulmonary effort is normal.  Skin:    General: Skin is dry.  Neurological:     Mental Status: She is alert and oriented to person, place, and time.  Psychiatric:        Attention and Perception: Attention and perception normal.        Mood and Affect: Mood and affect normal.        Speech: Speech normal.        Behavior: Behavior normal. Behavior is cooperative.        Thought Content: Thought content normal.        Cognition and Memory: Cognition is impaired.        Judgment: Judgment is impulsive.     Mental Status Exam: General Appearance: Casual  Orientation:  Full (Time, Place, and Person)  Memory:  Immediate;   Poor Recent;   Poor Remote;   Poor  Concentration:  Concentration: Fair  Recall:  Fair  Attention  Fair  Eye Contact:  Good  Speech:  Clear and Coherent  Language:  Good  Volume:  Normal  Mood: calm  Affect:  Congruent  Thought Process:  Coherent  Thought Content:  Logical  Suicidal Thoughts:  No  Homicidal Thoughts:  No  Judgement:  Impaired  Insight:  Lacking  Psychomotor Activity:  Normal  Akathisia:  No  Fund of Knowledge:  Fair      Assets:  Manufacturing systems engineer Housing Leisure Time Social Support  Cognition:  Impaired,  Mild  ADL's:  Intact  AIMS (if indicated):        Other History   These have been pulled in through the EMR, reviewed, and updated if appropriate.  Family History:  The patient's family history includes Diabetes in her father; Hypertension in her mother.  Medical History: Past Medical History:  Diagnosis Date   Anxiety    Arthritis    Bipolar 1 disorder (HCC)    Cognitive  deficits    Depression    Diabetes mellitus without complication (HCC)    Hypertension    Mental disorder    Mental health disorder    Obesity     Surgical History: Past Surgical History:  Procedure Laterality Date   CESAREAN SECTION     CESAREAN SECTION N/A 04/25/2013   Procedure: REPEAT CESAREAN SECTION;  Surgeon: Winton Felt, MD;  Location: [facility];  Service: Obstetrics;  Laterality: N/A;   MASS EXCISION N/A 06/03/2012   Procedure: EXCISION MASS;  Surgeon: Alm Bouche, MD;  Location: [facility];  Service: ENT;  Laterality: N/A;  Excision uvula mass   TONSILLECTOMY N/A 06/03/2012   Procedure: TONSILLECTOMY;  Surgeon: Alm Bouche, MD;  Location: [facility];  Service: ENT;  Laterality: N/A;   TONSILLECTOMY       Medications:   Current Facility-Administered Medications:  ziprasidone  (GEODON ) injection 20 mg, 20 mg, Intramuscular, Once PRN, Odell Balls, PA-C  Current Outpatient Medications:    amLODipine  (NORVASC ) 10 MG tablet, Take 1 tablet (10 mg total) by mouth daily., Disp: 14 tablet, Rfl: 0   atorvastatin  (LIPITOR) 20 MG tablet, Take 1 tablet (20 mg total) by mouth at bedtime., Disp: 14 tablet, Rfl: 0   benztropine  (COGENTIN ) 0.5 MG tablet, Take 1 tablet (0.5 mg total) by mouth 2 (two) times daily. For eps, Disp: 28 tablet, Rfl: 0   cephALEXin  (KEFLEX ) 500 MG capsule, Take 1 capsule (500 mg total) by mouth 2 (two) times daily., Disp: 14 capsule, Rfl: 0   cloNIDine  (CATAPRES ) 0.1 MG tablet, Take 1 tablet (0.1 mg total) by mouth daily., Disp: 14 tablet, Rfl: 0   diclofenac  Sodium (VOLTAREN ) 1 % GEL, Apply 4 g topically 4 (four) times daily. (Patient not taking: Reported on 11/17/2023), Disp: 350 g, Rfl: 0   gabapentin  (NEURONTIN ) 100 MG capsule, Take 1 capsule (100 mg total) by mouth 3 (three) times daily. For agitation, Disp: 42 capsule, Rfl: 0   gabapentin  (NEURONTIN ) 300 MG capsule, Take 300 mg by mouth 3 (three) times daily., Disp: ,  Rfl:    glimepiride  (AMARYL ) 4 MG tablet, Take 1 tablet (4 mg total) by mouth daily., Disp: 14 tablet, Rfl: 0   GLOBAL EASE INJECT PEN NEEDLES 32G X 4 MM MISC, Inject 1 each into the skin as directed., Disp: , Rfl:    haloperidol  (HALDOL ) 10 MG tablet, Take 1 tablet (10 mg total) by mouth at bedtime for 14 days., Disp: 14 tablet, Rfl:    insulin  aspart protamine- aspart (NOVOLOG  MIX 70/30) (70-30) 100 UNIT/ML injection, Inject 20 Units into the skin every evening., Disp: , Rfl:    INVEGA  SUSTENNA 156 MG/ML SUSY injection, SMARTSIG:1 IM Once, Disp: , Rfl:    lithium  carbonate (ESKALITH ) 450 MG ER tablet, Take 1 tablet (450 mg total) by mouth at bedtime. For mood stabilization, Disp: 14 tablet, Rfl: 0   loperamide  (IMODIUM ) 2 MG capsule, Take 1 capsule (2 mg total) by mouth 4 (four) times daily as needed for diarrhea or loose stools., Disp: 12 capsule, Rfl: 0   metoprolol  tartrate (LOPRESSOR ) 25 MG tablet, Take 1 tablet (25 mg total) by mouth 2 (two) times daily for 14 days., Disp: 28 tablet, Rfl: 0   metroNIDAZOLE  (FLAGYL ) 500 MG tablet, Take 1 tablet (500 mg total) by mouth 2 (two) times daily., Disp: 14 tablet, Rfl: 0   ondansetron  (ZOFRAN -ODT) 4 MG disintegrating tablet, 4mg  ODT q4 hours prn nausea/vomit (Patient taking differently: Take 4 mg by mouth every 4 (four) hours as needed for nausea or vomiting.), Disp: 20 tablet, Rfl: 0   traZODone  (DESYREL ) 150 MG tablet, Take 1 tablet (150 mg total) by mouth at bedtime., Disp: 14 tablet, Rfl: 0   venlafaxine  XR (EFFEXOR -XR) 150 MG 24 hr capsule, Take 1 capsule (150 mg total) by mouth daily with breakfast., Disp: 14 capsule, Rfl: 0  Allergies: Allergies  Allergen Reactions   Wellbutrin  [Bupropion ] Shortness Of Breath   Buspar  [Buspirone ] Itching   Contrast Media [Iodinated Contrast Media] Swelling and Other (See Comments)    Eyes swell up   Omnipaque  [Iohexol ] Swelling and Other (See Comments)    Eye swelling   Penicillins Hives   Atarax   [Hydroxyzine ] Other (See Comments)    Causes hyperactivity, makes pt want to fight   Cymbalta  [Duloxetine  Hcl] Other (See Comments)    No appetite and makes the patient  act up    Augustino Savastano A Kline, NP

## 2024-02-11 NOTE — ED Notes (Signed)
 Pt belongings have been placed in locker 31

## 2024-02-11 NOTE — Discharge Instructions (Signed)
 [name]  Thank you for allowing us  to take care of you today.  You came to the Emergency Department today because you were having worsening of your mental health in the setting of your recent medication changes.  After observation here in the emergency department you are feeling much better and your mental health is back under control.  Please take your clonidine , fluvoxamine /luvox , and Zyprexa /olanzapine  nightly.  Please get your long-acting injectable Invega  at the end of the month as you usually are prescribed, and take your gabapentin  3 times a day.  To-Do: 1. Please follow-up with your primary doctor within 7 days / as soon as possible.   Please return to the Emergency Department or call 911 if you experience have worsening of your symptoms, or do not get better, chest pain, shortness of breath, severe or significantly worsening pain, high fever, severe confusion, pass out or have any reason to think that you need emergency medical care.   We hope you feel better soon.   Mitzie Later, MD Department of Emergency Medicine [facility]

## 2024-02-11 NOTE — BH Assessment (Signed)
 Comprehensive Clinical Assessment (CCA) Note  02/11/2024 [name] [id number]  Chief Complaint:  Chief Complaint  Patient presents with   Altered Mental Status  Disposition: Per Gaither Trudy PIETY patient will continue to be observed overnight and will be re-evaluated in the AM.    The patient demonstrates the following risk factors for suicide: Chronic risk factors for suicide include: psychiatric disorder of Borderline intellectual functioning, OCD,Schizoaffective disorder,  . Acute risk factors for suicide include: N/A. Protective factors for this patient include: hope for the future. Considering these factors, the overall suicide risk at this point appears to be moderate. Patient is appropriate for outpatient follow up.   Patient is a 33 year old female with a history of Borderline intellectual functioning, OCD,Schizoaffective disorder who presents voluntarily to [facility] for an assessment. Patient reports she was recently taken off of her Fluoxetine  3 days ago and would like to be restarted on this medication, She states she is unsure why her psychiatrist discontinued this medication but she feels like she needs it. She reports having an anxiety attack prior to her arrival and states that's why she was brought to the hospital. Patient denies all claims made in the EDP note. She states she is not experiencing in hallucinations and states she feels safe returning home once she receives her medication. Patient denies all depression symptoms. She denies substance use. Patient denies NSSIB, SI, HI, AVH.  Per EDP note Patient BIB EMS from [facility]. Active hallucinations reporting to EMS she has worms crawling all over her. She also voices she is married to [employer]. Patient with Bipolar, depression, cognitive deficits, HTN, T2DM. She states her doctor recently changed her medications.      Visit Diagnosis:  Schizoaffective disorder Medication management    CCA Screening, Triage and  Referral (STR)  Patient Reported Information How did you hear about us ? Legal System  What Is the Reason for Your Visit/Call Today? Per EDP note Patient BIB EMS from [facility]. Active hallucinations reporting to EMS she has worms crawling all over her. She also voices she is married to [employer]. Patient with Bipolar, depression, cognitive deficits, HTN, T2DM. She states her doctor recently changed her medications.   How Long Has This Been Causing You Problems? > than 6 months  What Do You Feel Would Help You the Most Today? Treatment for Depression or other mood problem   Have You Recently Had Any Thoughts About Hurting Yourself? No  Are You Planning to Commit Suicide/Harm Yourself At This time? No   Flowsheet Row ED from 02/10/2024 in [facility] Emergency Department at [facility] ED from 02/04/2024 in [facility] Emergency Department at [facility] ED from 01/25/2024 in [facility] Emergency Department at [facility]  C-SSRS RISK CATEGORY Moderate Risk No Risk No Risk    Have you Recently Had Thoughts About Hurting Someone Sherral? No  Are You Planning to Harm Someone at This Time? No  Explanation: n/a   Have You Used Any Alcohol or Drugs in the Past 24 Hours? No  How Long Ago Did You Use Drugs or Alcohol? n/a  What Did You Use and How Much? n/a   Do You Currently Have a Therapist/Psychiatrist? Yes  Name of Therapist/Psychiatrist: Name of Therapist/Psychiatrist: ACT team   Have You Been Recently Discharged From Any Office Practice or Programs? No  Explanation of Discharge From Practice/Program: NA     CCA Screening Triage Referral Assessment Type of Contact: Tele-Assessment  Telemedicine Service  Delivery: Telemedicine service delivery: This service was provided via telemedicine using a 2-way, interactive audio and video technology  Is this Initial or Reassessment? Is this Initial or Reassessment?: Initial Assessment  Date Telepsych  consult ordered in [facility]:  Date Telepsych consult ordered in CHL: 02/11/24  Time Telepsych consult ordered in CHL:  Time Telepsych consult ordered in CHL: 2204  Location of Assessment: [facility] ED  Provider Location: [facility] Assessment Services   Collateral Involvement: none   Does Patient Have a Automotive engineer Guardian? No  Legal Guardian Contact Information: n/a  Copy of Legal Guardianship Form: -- (n/a)  Legal Guardian Notified of Arrival: -- (n/a)  Legal Guardian Notified of Pending Discharge: -- (n/a)  If Minor and Not Living with Parent(s), Who has Custody? n/a  Is CPS involved or ever been involved? Never  Is APS involved or ever been involved? Never   Patient Determined To Be At Risk for Harm To Self or Others Based on Review of Patient Reported Information or Presenting Complaint? No  Method: No Plan  Availability of Means: No access or NA  Intent: Vague intent or NA  Notification Required: No need or identified person  Additional Information for Danger to Others Potential: -- (n/a)  Additional Comments for Danger to Others Potential: n/a  Are There Guns or Other Weapons in Your Home? No  Types of Guns/Weapons: na  Are These Weapons Safely Secured?                            -- (n/a)  Who Could Verify You Are Able To Have These Secured: n/a  Do You Have any Outstanding Charges, Pending Court Dates, Parole/Probation? Pt denies  Contacted To Inform of Risk of Harm To Self or Others: Other: Comment (n/a)    Does Patient Present under Involuntary Commitment? Yes (IVC by EDP)    [place] of Residence: [place]   Patient Currently Receiving the Following Services: ACTT Psychologist, educational)   Determination of Need: Urgent (48 hours)   Options For Referral: Medication Management; Outpatient Therapy     CCA Biopsychosocial Patient Reported Schizophrenia/Schizoaffective Diagnosis in Past: Yes   Strengths: Able to ask for help and  accept help   Mental Health Symptoms Depression:  None   Duration of Depressive symptoms:    Mania:  N/A   Anxiety:   N/A   Psychosis:  None   Duration of Psychotic symptoms:    Trauma:  None   Obsessions:  None   Compulsions:  None   Inattention:  N/A   Hyperactivity/Impulsivity:  Feeling of restlessness; Talks excessively; Fidgets with hands/feet   Oppositional/Defiant Behaviors:  N/A   Emotional Irregularity:  Recurrent suicidal behaviors/gestures/threats; Potentially harmful impulsivity; Mood lability; Intense/inappropriate anger   Other Mood/Personality Symptoms:  None    Mental Status Exam Appearance and self-care  Stature:  Average   Weight:  Obese   Clothing:  -- (scrubs)   Grooming:  Normal   Cosmetic use:  Age appropriate   Posture/gait:  Normal   Motor activity:  Not Remarkable   Sensorium  Attention:  Normal   Concentration:  Normal   Orientation:  Object; Person; Place; Time; X5; Situation   Recall/memory:  Normal   Affect and Mood  Affect:  Appropriate   Mood:  Euthymic   Relating  Eye contact:  Normal   Facial expression:  Responsive   Attitude toward examiner:  Cooperative   Thought and Language  Speech flow: Clear and Coherent   Thought content:  Appropriate to Mood and Circumstances   Preoccupation:  None   Hallucinations:  None   Organization:  Coherent; Passenger transport manager of Knowledge:  Fair   Intelligence:  Below average (per chart: Borderline Intellectual Functioning)   Abstraction:  Functional   Judgement:  Impaired   Reality Testing:  Distorted   Insight:  Fair   Decision Making:  Impulsive   Social Functioning  Social Maturity:  Impulsive; Irresponsible   Social Judgement:  Heedless; Victimized   Stress  Stressors:  Family conflict; Other (Comment) (mental health)   Coping Ability:  Overwhelmed   Skill Deficits:  Intellect/education; Self-control; Interpersonal;  Communication; Decision making   Supports:  Friends/Service system     Religion: Religion/Spirituality Are You A Religious Person?: No How Might This Affect Treatment?: N/A  Leisure/Recreation: Leisure / Recreation Do You Have Hobbies?: No  Exercise/Diet: Exercise/Diet Do You Exercise?: No Have You Gained or Lost A Significant Amount of Weight in the Past Six Months?: No Do You Follow a Special Diet?: No Do You Have Any Trouble Sleeping?: No   CCA Employment/Education Employment/Work Situation: Employment / Work Systems developer: On disability Why is Patient on Disability: n/a How Long has Patient Been on Disability: n/a Patient's Job has Been Impacted by Current Illness: No Has Patient ever Been in the [employer]?: No  Education: Education Is Patient Currently Attending School?: No Last Grade Completed: 11 Did You Attend College?: No Did You Have An Individualized Education Program (IIEP): No Did You Have Any Difficulty At School?: No Patient's Education Has Been Impacted by Current Illness: No   CCA Family/Childhood History Family and Relationship History: Family history Does patient have children?: Yes How many children?:  (uta) How is patient's relationship with their children?: uta  Childhood History:  Childhood History By whom was/is the patient raised?: Mother Did patient suffer any verbal/emotional/physical/sexual abuse as a child?: No Did patient suffer from severe childhood neglect?: No Has patient ever been sexually abused/assaulted/raped as an adolescent or adult?: No Was the patient ever a victim of a crime or a disaster?: No Witnessed domestic violence?: No Has patient been affected by domestic violence as an adult?: No       CCA Substance Use Alcohol/Drug Use: Alcohol / Drug Use Pain Medications: See MAR Prescriptions: See MAR Over the Counter: See MAR History of alcohol / drug use?: No history of alcohol / drug  abuse Longest period of sobriety (when/how long): N/A Negative Consequences of Use:  (N/A) Withdrawal Symptoms:  (N/A)                         ASAM's:  Six Dimensions of Multidimensional Assessment  Dimension 1:  Acute Intoxication and/or Withdrawal Potential:   Dimension 1:  Description of individual's past and current experiences of substance use and withdrawal: NA  Dimension 2:  Biomedical Conditions and Complications:   Dimension 2:  Description of patient's biomedical conditions and  complications: NA  Dimension 3:  Emotional, Behavioral, or Cognitive Conditions and Complications:  Dimension 3:  Description of emotional, behavioral, or cognitive conditions and complications: NA  Dimension 4:  Readiness to Change:  Dimension 4:  Description of Readiness to Change criteria: NA  Dimension 5:  Relapse, Continued use, or Continued Problem Potential:  Dimension 5:  Relapse, continued use, or continued problem potential critiera description: NA  Dimension 6:  Recovery/Living Environment:  Dimension  6:  Recovery/Iiving environment criteria description: NA  ASAM Severity Score: ASAM's Severity Rating Score: 0  ASAM Recommended Level of Treatment: ASAM Recommended Level of Treatment:  (n/a)   Substance use Disorder (SUD) Substance Use Disorder (SUD)  Checklist Symptoms of Substance Use:  (Pt denies substance use)  Recommendations for Services/Supports/Treatments: Recommendations for Services/Supports/Treatments Recommendations For Services/Supports/Treatments: Medication Management, ACCTT (Assertive Community Treatment), Inpatient Hospitalization, Day Treatment  Disposition Recommendation per psychiatric provider: Overnight observation   DSM5 Diagnoses: Patient Active Problem List   Diagnosis Date Noted   Agitation 09/28/2022   Suicidal ideations 01/04/2022   Suicidal overdose (HCC) 03/23/2021   Syphilis 07/15/2020   Malingering 06/05/2020   Gastroesophageal reflux disease  05/04/2020   Hyperglycemia due to type 2 diabetes mellitus (HCC) 05/04/2020   Long term (current) use of insulin  (HCC) 05/04/2020   Migraine without aura 05/04/2020   Morbid obesity (HCC) 05/04/2020   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 2 diabetes mellitus (HCC) 05/04/2020   Prolapsed internal hemorrhoids 05/04/2020   Vitamin D  deficiency 05/04/2020   Other symptoms and signs involving cognitive functions and awareness 05/04/2020   Suicide attempt (HCC)    Anxiety state 03/06/2020   Schizophrenia (HCC) 09/13/2019   Bipolar I disorder, most recent episode depressed (HCC) 06/23/2019   MDD (major depressive disorder) 10/10/2018   Schizoaffective disorder, bipolar type (HCC) 09/25/2018   Affective psychosis, bipolar (HCC) 06/13/2018   HTN (hypertension) 05/03/2018   Tobacco use disorder 05/03/2018   Adjustment disorder with emotional disturbance 01/02/2018   Schizophrenia, 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   Moderate bipolar I disorder, most recent episode depressed (HCC)    Schizoaffective disorder (HCC)    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/2017   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   OCD (obsessive compulsive disorder) 10/05/2016   Major depressive disorder, recurrent episode, mild 05/04/2016   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   Learning disability 07/18/2015   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/2015   Diabetes mellitus (HCC) 07/18/2015   MDD (major depressive disorder), recurrent, severe, 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   Hyperlipidemia 07/18/2015   Severe episode of recurrent major depressive disorder, without psychotic features (HCC)    Suicidal ideation    OD (overdose of drug), intentional self-harm, initial encounter (HCC)    Cognitive deficits 10/12/2012   Generalized anxiety disorder 06/28/2012     Referrals to Alternative Service(s): Referred to Alternative Service(s):   Place:   Date:   Time:    Referred to Alternative Service(s):   Place:    Date:   Time:    Referred to Alternative Service(s):   Place:   Date:   Time:    Referred to Alternative Service(s):   Place:   Date:   Time:     Mekenzie Modeste C Maliek Schellhorn, LCMHCA

## 2024-02-11 NOTE — ED Provider Notes (Addendum)
  [place] EMERGENCY DEPARTMENT AT [facility] Emergency Medicine Observation Re-evaluation Note  [name] is a 33 y.o. female, seen on rounds today.  Pt initially presented on 02/10/24 at 2037 to the ED for complaints of  Chief Complaint  Patient presents with   Altered Mental Status  Patient presented with visual, auditory, and tactile hallucinations as well as hallucinations in the setting of recent reported medication changes PMHx: Bipolar, depression, cognitive deficits, HTN, T2DM  Currently, the patient is sleeping calmly.  Physical Exam  BP 129/87 (BP Location: Right Arm)   Pulse 94   Temp 98.5 F (36.9 C) (Oral)   Resp 18   Ht 5' 7 (1.702 m)   Wt 122.9 kg   LMP 01/11/2024   SpO2 99%   BMI 42.44 kg/m  Physical Exam General: NAD Lungs: Normal effort Psych: Currently calm  ED Course / MDM  EKG:EKG Interpretation Date/Time:    Ventricular Rate:    PR Interval:    QRS Duration:    QT Interval:    QTC Calculation:   R Axis:      Text Interpretation:    I have reviewed the labs performed to date as well as medications administered while in observation.  Recent changes in the last 24 hours include psychiatry evaluated patient overnight and recommended overnight observation with reevaluation in the a.m.  Plan  Current plan is for pending psychiatry reevaluation.  Psychiatry evaluate the patient, and feels that she is cleared for discharge, recommendations from psychiatry highlighted below in italics:  ## Psychiatric Medication Recommendations:  Restart home meds --clonidine  0.1mg   PO Q HS --fluvoxamine  50mg  PO Q HS --gabapentin  300mg  PO TID --olanzapine  30mg  PO Q HS --invega  sustenna 234mg  IM Q month; last injection per ACTT 01/25/2024   ## Medical Decision Making Capacity: Not specifically addressed in this encounter   ## Further Work-up:  -- most recent EKG on 02/11/2024 had QtC of 459 -- Pertinent labwork reviewed earlier this admission  includes: CBC, CMP, acetaminophen , salicylate, alcohol and UDS   ## Disposition:-- There are no psychiatric contraindications to discharge at this time  On bedside reevaluation, patient calm, cooperative, not hyperverbal, no pressured speech, not responding to internal stimuli on exam, and no appreciable delusions.  Commitment change paperwork completed to rescind IVC.  Patient is alert and oriented, amenable to plan for discharge with outpatient follow-up.   [name], MD 02/11/24 [id number]    [name], MD 02/11/24 [phone]

## 2024-02-11 NOTE — ED Notes (Signed)
 Patient discharged off unit to home per provider.Patient alert, calm , cooperative and no s/s of distress at time of discharge.  Discharge and belongings given to patient. Patient ambulatory off unit, escorted by RN., Patient transported by bus.

## 2024-02-11 NOTE — ED Notes (Addendum)
 [facility] called Garnette who is listed in pts chart as the Team Lead of her ACTT for collateral information. [facility] left a HIPAA compliant message to return the call.   [facility] called pts mother Samah Lapiana for information about pts medication. Per pts mother pt would be able to tell the provider about her medication and the last time that she received an Invega  shot and the name of her ACTT. Pts mother was unsure of pts medications and the name of her ACTT.  [facility] located additional contact information for pts ACTT which is with [employer]. Garnette is team lead and Garrel is the nurse. [facility] spoke with Rosaline Hover at the office and a list of current medications as well as pts Invega  schedule will either be faxed or emailed for provider to review.  Garnette returned [facility]'s call to say that he is currently out of town and that [facility] would need to speak with the office. [facility] informed Garnette that we have spoken with the office and requested the medication list.    Chesley Holt, [facility]  02/11/24

## 2024-02-13 ENCOUNTER — Emergency Department (HOSPITAL_COMMUNITY)
Admission: EM | Admit: 2024-02-13 | Discharge: 2024-02-13 | Disposition: A | Discharge status: Home / Self Care | Payer: MEDICAID | Attending: Emergency Medicine | Admitting: Emergency Medicine

## 2024-02-13 ENCOUNTER — Encounter (HOSPITAL_COMMUNITY): Payer: Self-pay | Admitting: Emergency Medicine

## 2024-02-13 ENCOUNTER — Other Ambulatory Visit: Payer: Self-pay

## 2024-02-13 DIAGNOSIS — Z794 Long term (current) use of insulin: Secondary | ICD-10-CM | POA: Diagnosis not present

## 2024-02-13 DIAGNOSIS — N898 Other specified noninflammatory disorders of vagina: Secondary | ICD-10-CM

## 2024-02-13 DIAGNOSIS — E1165 Type 2 diabetes mellitus with hyperglycemia: Secondary | ICD-10-CM | POA: Diagnosis not present

## 2024-02-13 DIAGNOSIS — L292 Pruritus vulvae: Secondary | ICD-10-CM | POA: Diagnosis present

## 2024-02-13 DIAGNOSIS — Z7984 Long term (current) use of oral hypoglycemic drugs: Secondary | ICD-10-CM | POA: Diagnosis not present

## 2024-02-13 LAB — WET PREP, GENITAL
Clue Cells Wet Prep HPF POC: NONE SEEN
Sperm: NONE SEEN
Trich, Wet Prep: NONE SEEN
WBC, Wet Prep HPF POC: <10 (ref <10)
Yeast Wet Prep HPF POC: NONE SEEN

## 2024-02-13 LAB — CBG MONITORING, ED: Glucose-Capillary: 309 mg/dL — ABNORMAL HIGH (ref 70–99)

## 2024-02-13 NOTE — Discharge Instructions (Signed)
 Follow-up with your primary care provider

## 2024-02-13 NOTE — ED Provider Notes (Signed)
 [place] EMERGENCY DEPARTMENT AT [facility] Provider Note   CSN: [id number] Arrival date & time: 02/13/24  1519     Patient presents with: Vaginal Discharge   [name] is a 33 y.o. female.   33 year old female presents with complaint of ongoing vaginal itching.  Patient previously positive for trichomoniasis and yeast.  She states that she completed the treatment but continues to itch.  Patient's partner is at bedside, states that he was negative.       Prior to Admission medications   Medication Sig Start Date End Date Taking? Authorizing Provider  amLODipine  (NORVASC ) 10 MG tablet Take 1 tablet (10 mg total) by mouth daily. 04/13/23   Rollene Katz, MD  atorvastatin  (LIPITOR) 20 MG tablet Take 1 tablet (20 mg total) by mouth at bedtime. 04/13/23   Rollene Katz, MD  benztropine  (COGENTIN ) 0.5 MG tablet Take 1 tablet (0.5 mg total) by mouth 2 (two) times daily. For eps 04/13/23   Rollene Katz, MD  cephALEXin  (KEFLEX ) 500 MG capsule Take 1 capsule (500 mg total) by mouth 2 (two) times daily. 12/19/23   Keith, Kayla N, PA-C  cloNIDine  (CATAPRES ) 0.2 MG tablet Take 0.5 tablets (0.1 mg total) by mouth at bedtime. 02/11/24   [name], MD  diclofenac  Sodium (VOLTAREN ) 1 % GEL Apply 4 g topically 4 (four) times daily. Patient not taking: Reported on 11/17/2023 04/13/23   Rollene Katz, MD  fluvoxaMINE  (LUVOX ) 50 MG tablet Take 1 tablet (50 mg total) by mouth at bedtime. 02/11/24   [name], MD  gabapentin  (NEURONTIN ) 300 MG capsule Take 1 capsule (300 mg total) by mouth 3 (three) times daily. 02/11/24   [name], MD  glimepiride  (AMARYL ) 4 MG tablet Take 1 tablet (4 mg total) by mouth daily. 04/13/23   Rollene Katz, MD  GLOBAL EASE INJECT PEN NEEDLES 32G X 4 MM MISC Inject 1 each into the skin as directed. 10/20/22   [provider]  haloperidol  (HALDOL ) 10 MG tablet Take 1 tablet (10 mg total) by mouth  at bedtime for 14 days. 04/13/23 11/17/23  Rollene Katz, MD  insulin  aspart protamine- aspart (NOVOLOG  MIX 70/30) (70-30) 100 UNIT/ML injection Inject 20 Units into the skin every evening.    [provider]  INVEGA  SUSTENNA 156 MG/ML SUSY injection SMARTSIG:1 IM Once 11/26/23   [provider]  lithium  carbonate (ESKALITH ) 450 MG ER tablet Take 1 tablet (450 mg total) by mouth at bedtime. For mood stabilization 04/13/23   Rollene Katz, MD  loperamide  (IMODIUM ) 2 MG capsule Take 1 capsule (2 mg total) by mouth 4 (four) times daily as needed for diarrhea or loose stools. 09/10/23   Emil Share, DO  metoprolol  tartrate (LOPRESSOR ) 25 MG tablet Take 1 tablet (25 mg total) by mouth 2 (two) times daily for 14 days. 04/13/23 11/17/23  Rollene Katz, MD  metroNIDAZOLE  (FLAGYL ) 500 MG tablet Take 1 tablet (500 mg total) by mouth 2 (two) times daily. 02/04/24   Myriam Dorn BROCKS, PA  OLANZapine  (ZYPREXA ) 10 MG tablet Take 3 tablets (30 mg total) by mouth at bedtime. 02/11/24   [name], MD  ondansetron  (ZOFRAN -ODT) 4 MG disintegrating tablet 4mg  ODT q4 hours prn nausea/vomit Patient taking differently: Take 4 mg by mouth every 4 (four) hours as needed for nausea or vomiting. 09/10/23   Emil Share, DO  traZODone  (DESYREL ) 150 MG tablet Take 1 tablet (150 mg total) by mouth at bedtime. 04/13/23   Rollene,  Morene, MD  venlafaxine  XR (EFFEXOR -XR) 150 MG 24 hr capsule Take 1 capsule (150 mg total) by mouth daily with breakfast. 04/13/23   Rollene Morene, MD    Allergies: Wellbutrin  [bupropion ], Buspar  [buspirone ], Contrast media [iodinated contrast media], Omnipaque  [iohexol ], Penicillins, Atarax  [hydroxyzine ], and Cymbalta  [duloxetine  hcl]    Review of Systems Negative except as per HPI Updated Vital Signs BP (!) 148/97   Pulse 100   Temp 98.4 F (36.9 C) (Oral)   Resp 20   LMP 01/11/2024   SpO2 98%   Physical Exam Vitals and nursing note reviewed.   Constitutional:      General: She is not in acute distress.    Appearance: She is well-developed. She is not diaphoretic.  HENT:     Head: Normocephalic and atraumatic.  Pulmonary:     Effort: Pulmonary effort is normal.  Neurological:     Mental Status: She is alert and oriented to person, place, and time.  Psychiatric:        Behavior: Behavior normal.     (all labs ordered are listed, but only abnormal results are displayed) Labs Reviewed  CBG MONITORING, ED - Abnormal; Notable for the following components:      Result Value   Glucose-Capillary 309 (*)    All other components within normal limits  WET PREP, GENITAL    EKG: None  Radiology: No results found.   Procedures   Medications Ordered in the ED - No data to display                                  Medical Decision Making Amount and/or Complexity of Data Reviewed Labs: ordered.   33 year old female past medical history of diabetes presents with concern for vaginal itching.  Wet prep was self collected and is negative for trichomoniasis, yeast, BV.  Patient's glucose is 309.  Recommend follow-up with primary care provider for further evaluation management.     Final diagnoses:  Vaginal itching    ED Discharge Orders     None          Beverley Leita LABOR, PA-C 02/13/24 1749    Horton, Roxie HERO, DO 02/13/24 2342

## 2024-02-13 NOTE — ED Triage Notes (Signed)
 Pt reports feeling her trichomonas has not resolved as she has not had improvement in symptoms.  Pt is still taking medication prescribed.  Also requested a blood sugar check as she couldn't get an appt with endocrinologist until January

## 2024-03-13 ENCOUNTER — Other Ambulatory Visit: Payer: Self-pay

## 2024-03-13 ENCOUNTER — Encounter (HOSPITAL_COMMUNITY): Payer: Self-pay

## 2024-03-13 ENCOUNTER — Emergency Department (HOSPITAL_COMMUNITY)
Admission: EM | Admit: 2024-03-13 | Discharge: 2024-03-15 | Disposition: A | Payer: MEDICAID | Attending: Emergency Medicine | Admitting: Emergency Medicine

## 2024-03-13 DIAGNOSIS — E1165 Type 2 diabetes mellitus with hyperglycemia: Secondary | ICD-10-CM | POA: Diagnosis not present

## 2024-03-13 DIAGNOSIS — Z79899 Other long term (current) drug therapy: Secondary | ICD-10-CM | POA: Diagnosis not present

## 2024-03-13 DIAGNOSIS — Z794 Long term (current) use of insulin: Secondary | ICD-10-CM | POA: Diagnosis not present

## 2024-03-13 DIAGNOSIS — F250 Schizoaffective disorder, bipolar type: Secondary | ICD-10-CM | POA: Diagnosis present

## 2024-03-13 DIAGNOSIS — I10 Essential (primary) hypertension: Secondary | ICD-10-CM | POA: Diagnosis not present

## 2024-03-13 DIAGNOSIS — T424X2A Poisoning by benzodiazepines, intentional self-harm, initial encounter: Secondary | ICD-10-CM

## 2024-03-13 DIAGNOSIS — R45851 Suicidal ideations: Secondary | ICD-10-CM | POA: Insufficient documentation

## 2024-03-13 LAB — COMPREHENSIVE METABOLIC PANEL WITH GFR
ALT: 29 U/L (ref 0–44)
AST: 30 U/L (ref 15–41)
Albumin: 4.3 g/dL (ref 3.5–5.0)
Alkaline Phosphatase: 71 U/L (ref 38–126)
Anion gap: 14 (ref 5–15)
BUN: 9 mg/dL (ref 6–20)
CO2: 25 mmol/L (ref 22–32)
Calcium: 10 mg/dL (ref 8.9–10.3)
Chloride: 100 mmol/L (ref 98–111)
Creatinine, Ser: 0.77 mg/dL (ref 0.44–1.00)
GFR, Estimated: >60 mL/min (ref >60)
Glucose, Bld: 320 mg/dL — ABNORMAL HIGH (ref 70–99)
Potassium: 4.1 mmol/L (ref 3.5–5.1)
Sodium: 139 mmol/L (ref 135–145)
Total Bilirubin: 0.4 mg/dL (ref 0.0–1.2)
Total Protein: 7.3 g/dL (ref 6.5–8.1)

## 2024-03-13 LAB — CBC WITH DIFFERENTIAL/PLATELET
Abs Immature Granulocytes: 0.03 K/uL (ref 0.00–0.07)
Basophils Absolute: 0.1 K/uL (ref 0.0–0.1)
Basophils Relative: 1 %
Eosinophils Absolute: 0.3 K/uL (ref 0.0–0.5)
Eosinophils Relative: 3 %
HCT: 41.3 % (ref 36.0–46.0)
Hemoglobin: 13.4 g/dL (ref 12.0–15.0)
Immature Granulocytes: 0 %
Lymphocytes Relative: 35 %
Lymphs Abs: 3.8 K/uL (ref 0.7–4.0)
MCH: 25.4 pg — ABNORMAL LOW (ref 26.0–34.0)
MCHC: 32.4 g/dL (ref 30.0–36.0)
MCV: 78.2 fL — ABNORMAL LOW (ref 80.0–100.0)
Monocytes Absolute: 0.7 K/uL (ref 0.1–1.0)
Monocytes Relative: 6 %
Neutro Abs: 5.9 K/uL (ref 1.7–7.7)
Neutrophils Relative %: 55 %
Platelets: 234 K/uL (ref 150–400)
RBC: 5.28 MIL/uL — ABNORMAL HIGH (ref 3.87–5.11)
RDW: 15.4 % (ref 11.5–15.5)
WBC: 10.7 K/uL — ABNORMAL HIGH (ref 4.0–10.5)
nRBC: 0 % (ref 0.0–0.2)

## 2024-03-13 LAB — URINE DRUG SCREEN
Amphetamines: NEGATIVE
Barbiturates: NEGATIVE
Benzodiazepines: POSITIVE — AB
Cocaine: NEGATIVE
Fentanyl: NEGATIVE
Methadone Scn, Ur: NEGATIVE
Opiates: NEGATIVE
Tetrahydrocannabinol: NEGATIVE

## 2024-03-13 LAB — LITHIUM LEVEL: Lithium Lvl: <0.1 mmol/L — ABNORMAL LOW (ref 0.60–1.20)

## 2024-03-13 LAB — SALICYLATE LEVEL: Salicylate Lvl: <7 mg/dL — ABNORMAL LOW (ref 7.0–30.0)

## 2024-03-13 LAB — ACETAMINOPHEN LEVEL: Acetaminophen (Tylenol), Serum: <10 ug/mL — ABNORMAL LOW (ref 10–30)

## 2024-03-13 LAB — CBG MONITORING, ED: Glucose-Capillary: 301 mg/dL — ABNORMAL HIGH (ref 70–99)

## 2024-03-13 LAB — ETHANOL: Alcohol, Ethyl (B): <15 mg/dL (ref <15)

## 2024-03-13 LAB — HCG, SERUM, QUALITATIVE: Preg, Serum: NEGATIVE

## 2024-03-13 MED ORDER — BENZTROPINE MESYLATE 0.5 MG PO TABS
0.5000 mg | ORAL_TABLET | Freq: Two times a day (BID) | ORAL | Status: DC
  Administered 2024-03-14 – 2024-03-15 (×4): 0.5 mg via ORAL
  Filled 2024-03-13 (×4): qty 1

## 2024-03-13 MED ORDER — GLIMEPIRIDE 4 MG PO TABS
4.0000 mg | ORAL_TABLET | Freq: Every day | ORAL | Status: DC
  Administered 2024-03-15: 4 mg via ORAL
  Filled 2024-03-13: qty 1

## 2024-03-13 MED ORDER — HEPARIN SOD (PORK) LOCK FLUSH 100 UNIT/ML IV SOLN: 500.0000 [IU] | Freq: Once | INTRAVENOUS | Status: DC

## 2024-03-13 MED ORDER — INSULIN ASPART PROT & ASPART (70-30 MIX) 100 UNIT/ML ~~LOC~~ SUSP: 20.0000 [IU] | Freq: Every evening | SUBCUTANEOUS | Status: DC

## 2024-03-13 MED ORDER — FLUVOXAMINE MALEATE 50 MG PO TABS
50.0000 mg | ORAL_TABLET | Freq: Every day | ORAL | Status: DC
  Administered 2024-03-14 (×2): 50 mg via ORAL
  Filled 2024-03-13 (×2): qty 1

## 2024-03-13 MED ORDER — LACTATED RINGERS IV BOLUS
1000.0000 mL | Freq: Once | INTRAVENOUS | Status: AC
Start: 2024-03-13 — End: 2024-03-15
  Administered 2024-03-13: 1000 mL via INTRAVENOUS

## 2024-03-13 MED ORDER — CLONIDINE HCL 0.1 MG PO TABS
0.1000 mg | ORAL_TABLET | Freq: Every day | ORAL | Status: DC
  Administered 2024-03-14 (×2): 0.1 mg via ORAL
  Filled 2024-03-13 (×2): qty 1

## 2024-03-13 MED ORDER — ATORVASTATIN CALCIUM 10 MG PO TABS
20.0000 mg | ORAL_TABLET | Freq: Every day | ORAL | Status: DC
  Administered 2024-03-14 (×2): 20 mg via ORAL
  Filled 2024-03-13 (×2): qty 2

## 2024-03-13 MED ORDER — AMLODIPINE BESYLATE 5 MG PO TABS
10.0000 mg | ORAL_TABLET | Freq: Every day | ORAL | Status: DC
  Administered 2024-03-14 – 2024-03-15 (×2): 10 mg via ORAL
  Filled 2024-03-13 (×2): qty 2

## 2024-03-13 MED ORDER — GABAPENTIN 300 MG PO CAPS
300.0000 mg | ORAL_CAPSULE | Freq: Three times a day (TID) | ORAL | Status: DC
  Administered 2024-03-14 (×2): 300 mg via ORAL
  Filled 2024-03-13 (×2): qty 1

## 2024-03-13 NOTE — ED Provider Notes (Signed)
 [place] EMERGENCY DEPARTMENT AT [facility] Provider Note   CSN: [id number] Arrival date & time: 03/13/24  8095     Patient presents with: Drug Overdose and Suicidal   [name] is a 33 y.o. female.    Drug Overdose     Patient has a history of anxiety, depression, cognitive deficits, bipolar disorder, obesity, hypertension, diabetes.  Patient presents ED for evaluation after an overdose.  Patient states she has been feeling suicidal.  She took 5-6 doses of her Ativan .  She usually is only supposed to take 2.  Patient states she also took a couple doses of her gabapentin .  Patient states she took this 30 minutes before arrival patient states she has been feeling depressed and more upset.  She states previously she was on fluoxetine  and her provider has taken her off of that medication.  She is not sure why but feels like she is getting worse because of that  Prior to Admission medications   Medication Sig Start Date End Date Taking? Authorizing Provider  amLODipine  (NORVASC ) 10 MG tablet Take 1 tablet (10 mg total) by mouth daily. 04/13/23   Rollene Katz, MD  atorvastatin  (LIPITOR) 20 MG tablet Take 1 tablet (20 mg total) by mouth at bedtime. 04/13/23   Rollene Katz, MD  benztropine  (COGENTIN ) 0.5 MG tablet Take 1 tablet (0.5 mg total) by mouth 2 (two) times daily. For eps 04/13/23   Rollene Katz, MD  cephALEXin  (KEFLEX ) 500 MG capsule Take 1 capsule (500 mg total) by mouth 2 (two) times daily. 12/19/23   Keith, Kayla N, PA-C  cloNIDine  (CATAPRES ) 0.2 MG tablet Take 0.5 tablets (0.1 mg total) by mouth at bedtime. 02/11/24   [name], MD  diclofenac  Sodium (VOLTAREN ) 1 % GEL Apply 4 g topically 4 (four) times daily. Patient not taking: Reported on 11/17/2023 04/13/23   Rollene Katz, MD  fluvoxaMINE  (LUVOX ) 50 MG tablet Take 1 tablet (50 mg total) by mouth at bedtime. 02/11/24   [name], MD  gabapentin  (NEURONTIN ) 300 MG  capsule Take 1 capsule (300 mg total) by mouth 3 (three) times daily. 02/11/24   [name], MD  glimepiride  (AMARYL ) 4 MG tablet Take 1 tablet (4 mg total) by mouth daily. 04/13/23   Rollene Katz, MD  GLOBAL EASE INJECT PEN NEEDLES 32G X 4 MM MISC Inject 1 each into the skin as directed. 10/20/22   [provider]  haloperidol  (HALDOL ) 10 MG tablet Take 1 tablet (10 mg total) by mouth at bedtime for 14 days. 04/13/23 11/17/23  Rollene Katz, MD  insulin  aspart protamine- aspart (NOVOLOG  MIX 70/30) (70-30) 100 UNIT/ML injection Inject 20 Units into the skin every evening.    [provider]  INVEGA  SUSTENNA 156 MG/ML SUSY injection SMARTSIG:1 IM Once 11/26/23   [provider]  lithium  carbonate (ESKALITH ) 450 MG ER tablet Take 1 tablet (450 mg total) by mouth at bedtime. For mood stabilization 04/13/23   Rollene Katz, MD  loperamide  (IMODIUM ) 2 MG capsule Take 1 capsule (2 mg total) by mouth 4 (four) times daily as needed for diarrhea or loose stools. 09/10/23   Emil Share, DO  metoprolol  tartrate (LOPRESSOR ) 25 MG tablet Take 1 tablet (25 mg total) by mouth 2 (two) times daily for 14 days. 04/13/23 11/17/23  Rollene Katz, MD  metroNIDAZOLE  (FLAGYL ) 500 MG tablet Take 1 tablet (500 mg total) by mouth 2 (two) times daily. 02/04/24   Myriam Dorn BROCKS, PA  OLANZapine  (  ZYPREXA ) 10 MG tablet Take 3 tablets (30 mg total) by mouth at bedtime. 02/11/24   [name], MD  ondansetron  (ZOFRAN -ODT) 4 MG disintegrating tablet 4mg  ODT q4 hours prn nausea/vomit Patient taking differently: Take 4 mg by mouth every 4 (four) hours as needed for nausea or vomiting. 09/10/23   Emil Share, DO  traZODone  (DESYREL ) 150 MG tablet Take 1 tablet (150 mg total) by mouth at bedtime. 04/13/23   Rollene Katz, MD  venlafaxine  XR (EFFEXOR -XR) 150 MG 24 hr capsule Take 1 capsule (150 mg total) by mouth daily with breakfast. 04/13/23   Rollene Katz, MD     Allergies: Wellbutrin  [bupropion ], Buspar  [buspirone ], Contrast media [iodinated contrast media], Omnipaque  Adrian.albert ], Penicillins, Atarax  [hydroxyzine ], and Cymbalta  [duloxetine  hcl]    Review of Systems  Updated Vital Signs BP (!) 145/97 (BP Location: Right Arm)   Pulse (!) 104   Temp 98.6 F (37 C) (Oral)   Resp 19   Ht 1.702 m (5' 7)   Wt 122.9 kg   SpO2 97%   BMI 42.44 kg/m   Physical Exam Vitals and nursing note reviewed.  Constitutional:      Appearance: She is well-developed. She is not diaphoretic.  HENT:     Head: Normocephalic and atraumatic.     Right Ear: External ear normal.     Left Ear: External ear normal.  Eyes:     General: No scleral icterus.       Right eye: No discharge.        Left eye: No discharge.     Conjunctiva/sclera: Conjunctivae normal.  Neck:     Trachea: No tracheal deviation.  Cardiovascular:     Rate and Rhythm: Normal rate and regular rhythm.  Pulmonary:     Effort: Pulmonary effort is normal. No respiratory distress.     Breath sounds: Normal breath sounds. No stridor. No wheezing or rales.  Abdominal:     General: Bowel sounds are normal. There is no distension.     Palpations: Abdomen is soft.     Tenderness: There is no abdominal tenderness. There is no guarding or rebound.  Musculoskeletal:        General: No tenderness or deformity.     Cervical back: Neck supple.  Skin:    General: Skin is warm and dry.     Findings: No rash.  Neurological:     General: No focal deficit present.     Mental Status: She is alert.     Cranial Nerves: No cranial nerve deficit, dysarthria or facial asymmetry.     Sensory: No sensory deficit.     Motor: No abnormal muscle tone or seizure activity.     Coordination: Coordination normal.  Psychiatric:        Mood and Affect: Mood normal.        Thought Content: Thought content includes suicidal ideation.     (all labs ordered are listed, but only abnormal results are displayed) Labs  Reviewed  COMPREHENSIVE METABOLIC PANEL WITH GFR - Abnormal; Notable for the following components:      Result Value   Glucose, Bld 320 (*)    All other components within normal limits  SALICYLATE LEVEL - Abnormal; Notable for the following components:   Salicylate Lvl <7.0 (*)    All other components within normal limits  ACETAMINOPHEN  LEVEL - Abnormal; Notable for the following components:   Acetaminophen  (Tylenol ), Serum <10 (*)    All other components within normal limits  URINE DRUG SCREEN - Abnormal; Notable for the following components:   Benzodiazepines POSITIVE (*)    All other components within normal limits  CBC WITH DIFFERENTIAL/PLATELET - Abnormal; Notable for the following components:   WBC 10.7 (*)    RBC 5.28 (*)    MCV 78.2 (*)    MCH 25.4 (*)    All other components within normal limits  LITHIUM  LEVEL - Abnormal; Notable for the following components:   Lithium  Lvl <0.10 (*)    All other components within normal limits  CBG MONITORING, ED - Abnormal; Notable for the following components:   Glucose-Capillary 301 (*)    All other components within normal limits  ETHANOL  HCG, SERUM, QUALITATIVE    EKG: EKG Interpretation Date/Time:  Monday March 13 2024 20:16:28 EST Ventricular Rate:  98 PR Interval:  182 QRS Duration:  86 QT Interval:  372 QTC Calculation: 474 R Axis:   71  Text Interpretation: Normal sinus rhythm Normal ECG When compared with ECG of 11-Feb-2024 11:26, No significant change since last tracing Confirmed by Randol Simmonds [phone]) on 03/13/2024 8:22:20 PM  Radiology: No results found.   Procedures   Medications Ordered in the ED  lactated ringers  bolus 1,000 mL (1,000 mLs Intravenous New Bag/Given 03/13/24 2023)    Clinical Course as of 03/13/24 2306  Mon Mar 13, 2024  2105 CBC WITH DIFFERENTIAL(!) CBC shows normal hemoglobin.  Metabolic panel shows hyperglycemia.  Alcohol level negative.  Acetaminophen  and salicylate level negative  [JK]  2141 Urine rapid drug screen (hosp performed)(!) UDS positive for benzodiazepines [JK]  2238 Lithium  level(!) subtherapeutic [JK]    Clinical Course User Index [JK] Randol Simmonds, MD                                 Medical Decision Making Amount and/or Complexity of Data Reviewed Labs: ordered. Decision-making details documented in ED Course.  Patient Patient presented to the ED for evaluation of suicidal ideation and an overdose.  Patient reports taking extra doses of benzodiazepines.  ED workup reassuring.  No signs of any significant metabolic abnormalities.  Patient salicylate and acetaminophen  level were negative.  Patient was monitored in the ED for several hours.  No signs of any cardiac dysrhythmia.  Otherwise medically cleared at this time.  The patient has been placed in psychiatric observation due to the need to provide a safe environment for the patient while obtaining psychiatric consultation and evaluation, as well as ongoing medical and medication management to treat the patient's condition.  The patient has not been placed under full IVC at this time.      Final diagnoses:  Suicidal ideation    ED Discharge Orders     None          Randol Simmonds, MD 03/13/24 2306

## 2024-03-13 NOTE — ED Triage Notes (Addendum)
 Pt BIBA from home, c/o SI and hallucinations. Auditory and visual hallucinations.  Pt stated she took 5-6 doses of ativan  when she usually takes 2 per day.  No pill bottle found by EMS.  Pt stated she needs her fluoxetine  that her provider has not prescribed it. A&Ox4  BP 130 palp HR 106 RR 20 O2 98 RA CBG 310

## 2024-03-13 NOTE — ED Notes (Addendum)
 [employer] [name] contacted about potential OD benzos request for lithium  level obtained lab called to add and they do have blood to run test. Placing order for lithium  level. Provide no new symptoms and labs return WNL pt is cleared for psych at 0100 per [facility].

## 2024-03-14 DIAGNOSIS — T424X2A Poisoning by benzodiazepines, intentional self-harm, initial encounter: Secondary | ICD-10-CM

## 2024-03-14 DIAGNOSIS — F250 Schizoaffective disorder, bipolar type: Secondary | ICD-10-CM

## 2024-03-14 LAB — HEMOGLOBIN A1C
Hgb A1c MFr Bld: 12.1 % — ABNORMAL HIGH (ref 4.8–5.6)
Mean Plasma Glucose: 300.57 mg/dL

## 2024-03-14 LAB — CBG MONITORING, ED
Glucose-Capillary: 275 mg/dL — ABNORMAL HIGH (ref 70–99)
Glucose-Capillary: 282 mg/dL — ABNORMAL HIGH (ref 70–99)
Glucose-Capillary: 338 mg/dL — ABNORMAL HIGH (ref 70–99)
Glucose-Capillary: 280 mg/dL — ABNORMAL HIGH (ref 70–99)

## 2024-03-14 MED ORDER — INSULIN GLARGINE-YFGN 100 UNIT/ML ~~LOC~~ SOLN
20.0000 [IU] | Freq: Every day | SUBCUTANEOUS | Status: DC
  Administered 2024-03-14: 20 [IU] via SUBCUTANEOUS
  Filled 2024-03-14 (×2): qty 0.2

## 2024-03-14 MED ORDER — FLUCONAZOLE 150 MG PO TABS
150.0000 mg | ORAL_TABLET | Freq: Once | ORAL | Status: AC
  Administered 2024-03-14: 150 mg via ORAL
  Filled 2024-03-14: qty 1

## 2024-03-14 MED ORDER — INSULIN ASPART 100 UNIT/ML IJ SOLN
0.0000 [IU] | Freq: Three times a day (TID) | INTRAMUSCULAR | Status: DC
  Administered 2024-03-14: 8 [IU] via SUBCUTANEOUS
  Administered 2024-03-14: 11 [IU] via SUBCUTANEOUS
  Administered 2024-03-15: 8 [IU] via SUBCUTANEOUS
  Filled 2024-03-14 (×2): qty 8
  Filled 2024-03-14: qty 11

## 2024-03-14 MED ORDER — GABAPENTIN 300 MG PO CAPS: 300.0000 mg | ORAL_CAPSULE | Freq: Three times a day (TID) | ORAL | Status: DC | PRN

## 2024-03-14 MED ORDER — INSULIN ASPART 100 UNIT/ML IJ SOLN: 5.0000 [IU] | Freq: Once | INTRAMUSCULAR | Status: DC

## 2024-03-14 MED ORDER — INSULIN ASPART 100 UNIT/ML IJ SOLN
0.0000 [IU] | Freq: Every day | INTRAMUSCULAR | Status: DC
  Administered 2024-03-14: 3 [IU] via SUBCUTANEOUS

## 2024-03-14 MED ORDER — OLANZAPINE 10 MG PO TABS
30.0000 mg | ORAL_TABLET | Freq: Every day | ORAL | Status: DC
  Administered 2024-03-14: 30 mg via ORAL
  Filled 2024-03-14: qty 3

## 2024-03-14 MED ORDER — DIAZEPAM 5 MG/ML IJ SOLN: 10.0000 mg | Freq: Four times a day (QID) | INTRAMUSCULAR | Status: DC | PRN

## 2024-03-14 MED ORDER — ZIPRASIDONE MESYLATE 20 MG IM SOLR: 10.0000 mg | Freq: Four times a day (QID) | INTRAMUSCULAR | Status: DC | PRN

## 2024-03-14 NOTE — ED Notes (Signed)
 Call from [place] at [facility] states other than possibly repeating tylenol  level pt is cleared from his point of view.

## 2024-03-14 NOTE — Progress Notes (Signed)
 LCSW Progress Note  [id number]   [name]  03/14/2024  9:09 AM  Description:   Inpatient Psychiatric Referral  Patient was recommended inpatient per Adriana Pontes MD. There are no available beds at [facility], per [facility] Noberto Qua RN). Patient was referred to the following out of network facilities:    Destination  Service Provider Address Phone Fax  [facility]  [address] [place] [address] [phone]  [facility]  [address] [place], [place] [address] [phone]  [facility]  [address], [place] [address] [phone]  [facility]  [address], [place] [address] [phone]  [facility]  [address], [place] [address] [phone]  [facility]  [address] [place], [place] [phone]  [facility]  [address] [place] [address] [phone]  [facility]  [address] [place] [address] [phone]  [facility]  [address] [place] [address] [phone]  [facility]  [address] [place] [address] [phone]  [facility]  [address], [place] [address] [phone]      Situation ongoing, CSW to continue following and update chart as more information becomes available.      [place] Allye Hoyos, MSW, LCSW  03/14/2024 9:09 AM

## 2024-03-14 NOTE — ED Notes (Signed)
 Patient eating breakfast calmly.

## 2024-03-14 NOTE — BH Assessment (Signed)
 Patient was deferred to [facility] for a telepsych assessment. The assigned care coordinator will provide updates regarding the scheduling of the assessment. IRIS coordinator can be reached at [phone] for further information on the timing of the telepsych evaluation.

## 2024-03-14 NOTE — Progress Notes (Signed)
 Pt has been accepted to [facility] TODAY 03/14/2024, Bed assignment: Main campus  Pt meets inpatient criteria per: Adriana Pontes MD  Attending Physician will be Millie Manners, MD  Report can be called to: [phone] (this is a pager, please leave call-back number when giving report)  Pt can arrive ASAP  Care Team Notified: Cathaleen Jacobson NP, Landry Bull RN  [place] Shakur Lembo LCSW-A   03/14/2024 9:23 AM

## 2024-03-14 NOTE — ED Provider Notes (Signed)
 Psychiatry note is appreciated.  Patient requires inpatient care, is not willing to come in voluntarily.  She is actively suicidal.  I have initiated involuntary commitment proceedings.   Raford Lenis, MD 03/14/24 [phone]

## 2024-03-14 NOTE — Inpatient Diabetes Management (Signed)
 Inpatient Diabetes Program Recommendations  AACE/ADA: New Consensus Statement on Inpatient Glycemic Control (2015)  Target Ranges:  Prepandial:   less than 140 mg/dL      Peak postprandial:   less than 180 mg/dL (1-2 hours)      Critically ill patients:  140 - 180 mg/dL   Lab Results  Component Value Date   GLUCAP 275 (H) 03/14/2024   HGBA1C 8.1 (H) 11/16/2023    Review of Glycemic Control  Latest Reference Range & Units 03/13/24 20:08 03/14/24 09:19  Glucose-Capillary 70 - 99 mg/dL 698 (H) 724 (H)  (H): Data is abnormally high  Diabetes history: DM2  Outpatient Diabetes medications:  70/30 20 units at bedtime Amaryl  4 mg every day  Per chart review- Supposed to start Glipizide  10 every day on 03/24/24 and was supposed to start Tresiba 40 every day   Current orders for Inpatient glycemic control:  70/30-20 units at bedtime Novolog  5 x 1 Amaryl  4 mg QD  Inpatient Diabetes Program Recommendations:    Please consider:  Semglee  20 units every day Novolog  0-15 units TID and 0-5 units at bedtime   Chart reviewed.  Per office visit on 12/29/23- SHORT ACTING INSULIN  WOULD BE INDICATED AT THIS TIME BUT DUE TO HER COGNITIVE IMPAIREMENT I AM NOT CONFIDENT SHE WILL ADMNISTER THE INSULIN  PROPERLY. WE WILL SET HER UP WITH A HOME NURSE TO HELP TEACH HER HOW TO TAKE HER MEDICATION PROPERLY AND REFER TO ENDOCRINOLOGY AT THIS TIME  Thank you, Wyvonna Pinal, MSN, CDCES Diabetes Coordinator Inpatient Diabetes Program [phone] (team pager from 8a-5p)

## 2024-03-14 NOTE — ED Notes (Signed)
  Signed      Pt has been accepted to [facility] TODAY 03/14/2024, Bed assignment: Main campus   Pt meets inpatient criteria per: Adriana Pontes MD   Attending Physician will be Millie Manners, MD   Report can be called to: [phone] (this is a pager, please leave call-back number when giving report)   Pt can arrive ASAP   Care Team Notified: Cathaleen Jacobson NP, Landry Bull RN   [place] LCSW-A    03/14/2024 9:23 AM

## 2024-03-14 NOTE — ED Notes (Signed)
 Patient has been cooperative this shift. Medication compliant.  Patient sleeping most of the shift. No suicidal ideation noted.  No homicidal ideation noted.  No delusions noted.

## 2024-03-14 NOTE — Consult Note (Signed)
 [facility] Consult Note  Patient Name: [name] MRN: [id number] DOB: [birth year] DATE OF Consult: 03/14/2024  PRIMARY PSYCHIATRIC DIAGNOSES   1.  Schizoaffective Disorder, Bipolar Type 2.  Suicide attempt by overdose   RECOMMENDATIONS  Recommendations: Medication recommendations: Continue her current outpatient medications, as follow:  Luvox , 50 mg at bedtime for depression/anxiety; Neurontin , 300 mg tid for anxiety; Zyprexa , 30 mg at bedtime for psychosis/mood control; clonidine  0.1 mg at bedtime for anxiety; Cogentin , 0.5 mg bid for EPS.  For agitation/aggression:  Geodon , 10 mg IM q6h PRN and Valium, 10 mg IM q6h PRN Non-Medication/therapeutic recommendations: Patient continues with severe depression and is s/p suicide attempt.  She is not willing to come into hospital voluntarily, and therefore she meets IVC criteria for admission, and I recommend that IVC be initiated.  Continue with close observation, per ED protocol, until she can be safely admitted to Psychiatry.  Continue matter-of-fact emotional support in ED, pending transfer Is inpatient psychiatric hospitalization recommended for this patient? Yes (Explain why): See above Is another care setting recommended for this patient? (examples may include Crisis Stabilization Unit, Residential/Recovery Treatment, ALF/SNF, Memory Care Unit)  No (Explain why): See above From a psychiatric perspective, is this patient appropriate for discharge to an outpatient setting/resource or other less restrictive environment for continued care?  No (Explain why): See above Follow-Up Telepsychiatry C/L services: We will sign off for now. Please re-consult our service if needed for any concerning changes in the patient's condition, discharge planning, or questions. Communication: Treatment team members (and family members if applicable) who were involved in treatment/care discussions and planning, and with whom we spoke or engaged with via  secure text/chat, include the following: Secure message sent to Dr. Raford, ED attending, and ED staff, outlining recommendations  Thank you for involving us  in the care of this patient. If you have any additional questions or concerns, please call [phone] and ask for the provider on-call.   TELEPSYCHIATRY ATTESTATION & CONSENT   As the provider for this telehealth consult, I attest that I verified the patient's identity using two separate identifiers, introduced myself to the patient, provided my credentials, disclosed my location, and performed this encounter via a HIPAA-compliant, real-time, face-to-face, two-way, interactive audio and video platform and with the full consent and agreement of the patient (or guardian as applicable.)   Patient physical location: ED, [facility]. Telehealth provider physical location: home office in state of [place] .  Video start time: 0335h EST  Video end time: 0345h EST   Total time spent in this encounter was 30 minutes, including record review, clinical interview, behavior observations, discussion of impressions and recommendations (including medications and hospitalization), and consultation/communication with relevant parties   IDENTIFYING DATA  [name] is a 33 y.o. year-old female for whom a psychiatric consultation has been ordered by the primary provider. The patient was identified using two separate identifiers.  CHIEF COMPLAINT/REASON FOR CONSULT   I tried to overdose on my medicine.   HISTORY OF PRESENT ILLNESS (HPI)  The patient presents with longstanding history of schizoaffective disorder, appears in ED after an overdose of her Ativan .  Patient was tired and did not want to speak much, but she did admit that she purposely overtook her Ativan  today, and she did say that at the time she was trying to kill herself.  She hinted that this may have had something to do with her mother, but she was not clear on that.  Told staff  earlier tonight  that she was having auditory hallucinations, and these may be command hallucinations.  No homicidal ideation.  UDS positive for benzos (which she had ingested).  BAL negative.     PAST PSYCHIATRIC HISTORY  As above Otherwise as per HPI above.  PAST MEDICAL HISTORY  Past Medical History:  Diagnosis Date   Anxiety    Arthritis    Bipolar 1 disorder (HCC)    Cognitive deficits    Depression    Diabetes mellitus without complication (HCC)    Hypertension    Mental disorder    Mental health disorder    Obesity      HOME MEDICATIONS  Facility Ordered Medications  Medication   [COMPLETED] lactated ringers  bolus 1,000 mL   amLODipine  (NORVASC ) tablet 10 mg   atorvastatin  (LIPITOR) tablet 20 mg   benztropine  (COGENTIN ) tablet 0.5 mg   cloNIDine  (CATAPRES ) tablet 0.1 mg   fluvoxaMINE  (LUVOX ) tablet 50 mg   gabapentin  (NEURONTIN ) capsule 300 mg   glimepiride  (AMARYL ) tablet 4 mg   insulin  aspart protamine- aspart (NOVOLOG  MIX 70/30) injection 20 Units   OLANZapine  (ZYPREXA ) tablet 30 mg   ziprasidone  (GEODON ) injection 10 mg   diazepam (VALIUM) injection 10 mg   PTA Medications  Medication Sig   GLOBAL EASE INJECT PEN NEEDLES 32G X 4 MM MISC Inject 1 each into the skin as directed.   amLODipine  (NORVASC ) 10 MG tablet Take 1 tablet (10 mg total) by mouth daily.   atorvastatin  (LIPITOR) 20 MG tablet Take 1 tablet (20 mg total) by mouth at bedtime.   lithium  carbonate (ESKALITH ) 450 MG ER tablet Take 1 tablet (450 mg total) by mouth at bedtime. For mood stabilization   traZODone  (DESYREL ) 150 MG tablet Take 1 tablet (150 mg total) by mouth at bedtime.   venlafaxine  XR (EFFEXOR -XR) 150 MG 24 hr capsule Take 1 capsule (150 mg total) by mouth daily with breakfast.   glimepiride  (AMARYL ) 4 MG tablet Take 1 tablet (4 mg total) by mouth daily.   benztropine  (COGENTIN ) 0.5 MG tablet Take 1 tablet (0.5 mg total) by mouth 2 (two) times daily. For eps   diclofenac  Sodium  (VOLTAREN ) 1 % GEL Apply 4 g topically 4 (four) times daily. (Patient not taking: Reported on 11/17/2023)   ondansetron  (ZOFRAN -ODT) 4 MG disintegrating tablet 4mg  ODT q4 hours prn nausea/vomit (Patient taking differently: Take 4 mg by mouth every 4 (four) hours as needed for nausea or vomiting.)   loperamide  (IMODIUM ) 2 MG capsule Take 1 capsule (2 mg total) by mouth 4 (four) times daily as needed for diarrhea or loose stools.   insulin  aspart protamine- aspart (NOVOLOG  MIX 70/30) (70-30) 100 UNIT/ML injection Inject 20 Units into the skin every evening.   INVEGA  SUSTENNA 156 MG/ML SUSY injection SMARTSIG:1 IM Once   cephALEXin  (KEFLEX ) 500 MG capsule Take 1 capsule (500 mg total) by mouth 2 (two) times daily.   metroNIDAZOLE  (FLAGYL ) 500 MG tablet Take 1 tablet (500 mg total) by mouth 2 (two) times daily.   cloNIDine  (CATAPRES ) 0.2 MG tablet Take 0.5 tablets (0.1 mg total) by mouth at bedtime.   fluvoxaMINE  (LUVOX ) 50 MG tablet Take 1 tablet (50 mg total) by mouth at bedtime.   gabapentin  (NEURONTIN ) 300 MG capsule Take 1 capsule (300 mg total) by mouth 3 (three) times daily.   OLANZapine  (ZYPREXA ) 10 MG tablet Take 3 tablets (30 mg total) by mouth at bedtime.     ALLERGIES  Allergies  Allergen Reactions   Wellbutrin  [Bupropion ] Shortness  Of Breath   Buspar  [Buspirone ] Itching   Contrast Media [Iodinated Contrast Media] Swelling and Other (See Comments)    Eyes swell up   Omnipaque  [Iohexol ] Swelling and Other (See Comments)    Eye swelling   Penicillins Hives   Atarax  [Hydroxyzine ] Other (See Comments)    Causes hyperactivity, makes pt want to fight   Cymbalta  [Duloxetine  Hcl] Other (See Comments)    No appetite and makes the patient act up    SOCIAL & SUBSTANCE USE HISTORY  Social History   Socioeconomic History   Marital status: Single    Spouse name: Not on file   Number of children: Not on file   Years of education: Not on file   Highest education level: Not on file   Occupational History   Not on file  Tobacco Use   Smoking status: Every Day    Current packs/day: 3.00    Types: Cigars, Cigarettes   Smokeless tobacco: Never   Tobacco comments:    Pt declined  Vaping Use   Vaping status: Never Used  Substance and Sexual Activity   Alcohol use: Yes   Drug use: Yes    Types: Crack cocaine, Other-see comments, Marijuana   Sexual activity: Yes    Birth control/protection: None    Comment: occasionally  Other Topics Concern   Not on file  Social History Narrative   ** Merged History Encounter **       Social Drivers of Health   Financial Resource Strain: Not on file  Food Insecurity: Food Insecurity Present (06/22/2023)   Hunger Vital Sign    Worried About Running Out of Food in the Last Year: Sometimes true    Ran Out of Food in the Last Year: Sometimes true  Transportation Needs: No Transportation Needs (06/22/2023)   PRAPARE - Administrator, Civil Service (Medical): No    Lack of Transportation (Non-Medical): No  Physical Activity: Not on file  Stress: Not on file  Social Connections: Not on file   Social History   Tobacco Use  Smoking Status Every Day   Current packs/day: 3.00   Types: Cigars, Cigarettes  Smokeless Tobacco Never  Tobacco Comments   Pt declined   Social History   Substance and Sexual Activity  Alcohol Use Yes   Social History   Substance and Sexual Activity  Drug Use Yes   Types: Crack cocaine, Other-see comments, Marijuana    Additional pertinent information Patient lives on her own and is monitored by [facility].  FAMILY HISTORY  Family History  Problem Relation Age of Onset   Hypertension Mother    Diabetes Father    Family Psychiatric History (if known):  Did not report   MENTAL STATUS EXAM (MSE)  Mental Status Exam: General Appearance: Disheveled  Orientation:  Full (Time, Place, and Person)  Memory:  Immediate;   Did not wish to engage Recent;   Did not wish to engage Remote;    Did not wish to engage  Concentration:  Concentration: Poor and Attention Span: Poor  Recall:  Did not wish to engage  Attention  Poor  Eye Contact:  Poor  Speech:  Garbled and Slow  Language:  Fair  Volume:  Decreased  Mood: I don't know why I did this  Affect:  Depressed and Flat  Thought Process:  Disorganized  Thought Content:  Hallucinations: Command:  auditory, likely  Suicidal Thoughts:  Yes.  with intent/plan  Homicidal Thoughts:  No  Judgement:  Impaired  Insight:  Lacking  Psychomotor Activity:  Decreased  Akathisia:  Negative  Fund of Knowledge:  Did not wish to engage    Assets:  Engineer, Maintenance Social Support  Cognition:  WNL  ADL's:  Intact  AIMS (if indicated):       VITALS  Blood pressure 129/81, pulse 98, temperature 98.7 F (37.1 C), temperature source Oral, resp. rate 19, height 5' 7 (1.702 m), weight 122.9 kg, SpO2 100%.  LABS  Admission on 03/13/2024  Component Date Value Ref Range Status   Glucose-Capillary 03/13/2024 301 (H)  70 - 99 mg/dL Final   Glucose reference range applies only to samples taken after fasting for at least 8 hours.   Sodium 03/13/2024 139  135 - 145 mmol/L Final   Electrolytes repeated to verify    Potassium 03/13/2024 4.1  3.5 - 5.1 mmol/L Final   Chloride 03/13/2024 100  98 - 111 mmol/L Final   CO2 03/13/2024 25  22 - 32 mmol/L Final   Glucose, Bld 03/13/2024 320 (H)  70 - 99 mg/dL Final   Glucose reference range applies only to samples taken after fasting for at least 8 hours.   BUN 03/13/2024 9  6 - 20 mg/dL Final   Creatinine, Ser 03/13/2024 0.77  0.44 - 1.00 mg/dL Final   Calcium  03/13/2024 10.0  8.9 - 10.3 mg/dL Final   Total Protein 88/82/7974 7.3  6.5 - 8.1 g/dL Final   Albumin 88/82/7974 4.3  3.5 - 5.0 g/dL Final   AST 88/82/7974 30  15 - 41 U/L Final   ALT 03/13/2024 29  0 - 44 U/L Final   Alkaline Phosphatase 03/13/2024 71  38 - 126 U/L Final   Total Bilirubin 03/13/2024 0.4  0.0 - 1.2 mg/dL  Final   GFR, Estimated 03/13/2024 >60  >60 mL/min Final   Comment: (NOTE) Calculated using the CKD-EPI Creatinine Equation (2021)    Anion gap 03/13/2024 14  5 - 15 Final   Performed at [facility], [address] [place], [place] [address]   Salicylate Lvl 03/13/2024 <7.0 (L)  7.0 - 30.0 mg/dL Final   Performed at [facility], [address], [place], [place] [address]   Acetaminophen  (Tylenol ), Serum 03/13/2024 <10 (L)  10 - 30 ug/mL Final   Comment: (NOTE) Toxic concentrations can be more effectively related to post dose interval; >200, >100, and >50 ug/mL serum concentrations correspond to toxic concentrations at 4, 8, and 12 hours post dose, respectively.  Performed at [facility], [address] [place], [place] [address]    Alcohol, Ethyl (B) 03/13/2024 <15  <15 mg/dL Final   Comment: (NOTE) For medical purposes only. Performed at [facility], [address], [place], [place] [address]    Opiates 03/13/2024 NEGATIVE  NEGATIVE Final   Cocaine 03/13/2024 NEGATIVE  NEGATIVE Final   Benzodiazepines 03/13/2024 POSITIVE (A)  NEGATIVE Final   Amphetamines 03/13/2024 NEGATIVE  NEGATIVE Final   Tetrahydrocannabinol 03/13/2024 NEGATIVE  NEGATIVE Final   Barbiturates 03/13/2024 NEGATIVE  NEGATIVE Final   Methadone Scn, Ur 03/13/2024 NEGATIVE  NEGATIVE Final   Fentanyl  03/13/2024 NEGATIVE  NEGATIVE Final   Comment: (NOTE) Drug screen is for Medical Purposes only. Positive results are preliminary only. If confirmation is needed, notify lab within 5 days.  Drug Class                 Cutoff (ng/mL) Amphetamine and metabolites 1000 Barbiturate and metabolites  200 Benzodiazepine              200 Opiates and metabolites     300 Cocaine and metabolites     300 THC                         50 Fentanyl                     5 Methadone                   300  Trazodone  is metabolized in vivo to several  metabolites,  including pharmacologically active m-CPP, which is excreted in the  urine.  Immunoassay screens for amphetamines and MDMA have potential  cross-reactivity with these compounds and may provide false positive  result.  Performed at [facility], [address], [place], [place] [address]    WBC 03/13/2024 10.7 (H)  4.0 - 10.5 K/uL Final   RBC 03/13/2024 5.28 (H)  3.87 - 5.11 MIL/uL Final   Hemoglobin 03/13/2024 13.4  12.0 - 15.0 g/dL Final   HCT 88/82/7974 41.3  36.0 - 46.0 % Final   MCV 03/13/2024 78.2 (L)  80.0 - 100.0 fL Final   MCH 03/13/2024 25.4 (L)  26.0 - 34.0 pg Final   MCHC 03/13/2024 32.4  30.0 - 36.0 g/dL Final   RDW 88/82/7974 15.4  11.5 - 15.5 % Final   Platelets 03/13/2024 234  150 - 400 K/uL Final   nRBC 03/13/2024 0.0  0.0 - 0.2 % Final   Neutrophils Relative % 03/13/2024 55  % Final   Neutro Abs 03/13/2024 5.9  1.7 - 7.7 K/uL Final   Lymphocytes Relative 03/13/2024 35  % Final   Lymphs Abs 03/13/2024 3.8  0.7 - 4.0 K/uL Final   Monocytes Relative 03/13/2024 6  % Final   Monocytes Absolute 03/13/2024 0.7  0.1 - 1.0 K/uL Final   Eosinophils Relative 03/13/2024 3  % Final   Eosinophils Absolute 03/13/2024 0.3  0.0 - 0.5 K/uL Final   Basophils Relative 03/13/2024 1  % Final   Basophils Absolute 03/13/2024 0.1  0.0 - 0.1 K/uL Final   Immature Granulocytes 03/13/2024 0  % Final   Abs Immature Granulocytes 03/13/2024 0.03  0.00 - 0.07 K/uL Final   Performed at [facility], [address], [place], [place] [address]   Preg, Serum 03/13/2024 NEGATIVE  NEGATIVE Final   Comment:        THE SENSITIVITY OF THIS METHODOLOGY IS >10 mIU/mL. Performed at [facility], [address], [place], [place] [address]    Lithium  Lvl 03/13/2024 <0.10 (L)  0.60 - 1.20 mmol/L Final   Performed at [facility], [address] [place], [place] [address]    PSYCHIATRIC REVIEW OF SYSTEMS (ROS)  ROS:  Notable for the following relevant positive findings: Review of Systems  Constitutional: Negative.   HENT: Negative.    Eyes: Negative.   Respiratory: Negative.    Cardiovascular: Negative.   Gastrointestinal: Negative.   Genitourinary: Negative.   Musculoskeletal: Negative.   Skin: Negative.   Neurological: Negative.   Endo/Heme/Allergies: Negative.   Psychiatric/Behavioral:  Positive for depression, hallucinations and suicidal ideas. The patient is nervous/anxious.     Additional findings:      Musculoskeletal: No abnormal movements observed      Gait & Station: Laying/Sitting      Pain Screening: Denies  Nutrition & Dental Concerns: Reviewed   RISK FORMULATION/ASSESSMENT  Is the patient experiencing any suicidal or homicidal ideations: Yes       Explain if yes:   Patient continues with suicidal ideation, and may in fact be experiencing command hallucinations.  Overdose of Ativan  earlier today that was purposeful  Protective factors considered for safety management:   Patient is followed by [facility] team, but not able to maintain her own safety  Risk factors/concerns considered for safety management:  Prior attempt Depression Access to lethal means Impulsivity Isolation Unmarried  Is there a safety management plan with the patient and treatment team to minimize risk factors and promote protective factors: No           Explain: Patient is followed by [facility] team, but was unable to manage her sx's adequately.  Is crisis care placement or psychiatric hospitalization recommended: Yes     Based on my current evaluation and risk assessment, patient is determined at this time to be at:  High risk  *RISK ASSESSMENT Risk assessment is a dynamic process; it is possible that this patient's condition, and risk level, may change. This should be re-evaluated and managed over time as appropriate. Please re-consult psychiatric consult services if additional assistance is needed in  terms of risk assessment and management. If your team decides to discharge this patient, please advise the patient how to best access emergency psychiatric services, or to call 911, if their condition worsens or they feel unsafe in any way.   Adriana JINNY Pontes, MD Telepsychiatry Consult Services

## 2024-03-15 LAB — CBG MONITORING, ED
Glucose-Capillary: 258 mg/dL — ABNORMAL HIGH (ref 70–99)
Glucose-Capillary: 304 mg/dL — ABNORMAL HIGH (ref 70–99)
Glucose-Capillary: 240 mg/dL — ABNORMAL HIGH (ref 70–99)

## 2024-03-15 MED ORDER — INSULIN ASPART 100 UNIT/ML IJ SOLN
12.0000 [IU] | Freq: Once | INTRAMUSCULAR | Status: AC
  Administered 2024-03-15: 12 [IU] via SUBCUTANEOUS

## 2024-03-15 MED ORDER — INSULIN ASPART 100 UNIT/ML IJ SOLN
INTRAMUSCULAR | Status: AC
  Filled 2024-03-15: qty 12

## 2024-03-15 NOTE — ED Notes (Signed)
 Writer called [facility] and gave report to Sheppard Babe RN.

## 2024-03-15 NOTE — ED Notes (Signed)
 Oceanographer and was informed the officer transporting pt will be in the office at 10am.

## 2024-03-15 NOTE — ED Provider Notes (Signed)
 Emergency Medicine Observation Re-evaluation Note  [name] is a 33 y.o. female, seen on rounds today.  Pt initially presented to the ED for complaints of Drug Overdose and Suicidal Currently, the patient is in the room in bed.  Physical Exam  BP 126/72   Pulse 94   Temp 98.1 F (36.7 C) (Oral)   Resp 18   Ht 5' 7 (1.702 m)   Wt 122.9 kg   SpO2 96%   BMI 42.44 kg/m  Physical Exam General: Patient awakens to light turning on Cardiac: Normal rate Lungs: Normal effort Psych: Mood is appropriate  ED Course / MDM  EKG:EKG Interpretation Date/Time:  Monday March 13 2024 20:16:28 EST Ventricular Rate:  98 PR Interval:  182 QRS Duration:  86 QT Interval:  372 QTC Calculation: 474 R Axis:   71  Text Interpretation: Normal sinus rhythm Normal ECG When compared with ECG of 11-Feb-2024 11:26, No significant change since last tracing Confirmed by Randol Simmonds [phone]) on 03/13/2024 8:22:20 PM  I have reviewed the labs performed to date as well as medications administered while in observation.  Recent changes in the last 24 hours include placement recommended and bed accepted.  I discussed how the plan was for placement for the patient, she tells me that she does not wish to remain in the hospital and would like to go home, but she is agreeable to going to [facility].  Plan  Current plan is for placement, hopefully today.    Mannie Pac T, DO 03/15/24 [phone]

## 2024-03-15 NOTE — ED Notes (Signed)
 Pt called [facility] and left number for facility to call back.

## 2024-04-01 ENCOUNTER — Emergency Department (HOSPITAL_COMMUNITY): Payer: MEDICAID

## 2024-04-01 ENCOUNTER — Encounter (HOSPITAL_COMMUNITY): Payer: Self-pay

## 2024-04-01 ENCOUNTER — Emergency Department (HOSPITAL_COMMUNITY)
Admission: EM | Admit: 2024-04-01 | Discharge: 2024-04-01 | Discharge status: Against Medical Advice | Payer: MEDICAID | Attending: Emergency Medicine | Admitting: Emergency Medicine

## 2024-04-01 ENCOUNTER — Other Ambulatory Visit: Payer: Self-pay

## 2024-04-01 LAB — CBC
HCT: 39.3 % (ref 36.0–46.0)
Hemoglobin: 12.8 g/dL (ref 12.0–15.0)
MCH: 25.7 pg — ABNORMAL LOW (ref 26.0–34.0)
MCHC: 32.6 g/dL (ref 30.0–36.0)
MCV: 78.8 fL — ABNORMAL LOW (ref 80.0–100.0)
Platelets: 236 K/uL (ref 150–400)
RBC: 4.99 MIL/uL (ref 3.87–5.11)
RDW: 15.3 % (ref 11.5–15.5)
WBC: 8.9 K/uL (ref 4.0–10.5)
nRBC: 0 % (ref 0.0–0.2)

## 2024-04-01 LAB — BASIC METABOLIC PANEL WITH GFR
Anion gap: 12 (ref 5–15)
BUN: 9 mg/dL (ref 6–20)
CO2: 25 mmol/L (ref 22–32)
Calcium: 9.5 mg/dL (ref 8.9–10.3)
Chloride: 104 mmol/L (ref 98–111)
Creatinine, Ser: 0.87 mg/dL (ref 0.44–1.00)
GFR, Estimated: >60 mL/min (ref >60)
Glucose, Bld: 241 mg/dL — ABNORMAL HIGH (ref 70–99)
Potassium: 3.8 mmol/L (ref 3.5–5.1)
Sodium: 140 mmol/L (ref 135–145)

## 2024-04-01 LAB — HCG, SERUM, QUALITATIVE: Preg, Serum: NEGATIVE

## 2024-04-01 LAB — TROPONIN T, HIGH SENSITIVITY: Troponin T High Sensitivity: <15 ng/L (ref 0–19)

## 2024-04-01 NOTE — ED Triage Notes (Signed)
 Per EMS:  CP Right side Radiates to right arm Ongoing 2-3 days Intermittent pain No injury Put tree up a few days ago Worsening with inspiration and movement

## 2024-04-01 NOTE — ED Provider Triage Note (Cosign Needed)
 Emergency Medicine Provider Triage Evaluation Note  [name] , a 33 y.o. female  was evaluated in triage.  Pt complains of CP. Intermittent R sided sharp cp x 4 days.  No fever, chills, cough, sob, nausea.  No abd pain  Review of Systems  Positive: As above Negative: As above  Physical Exam  BP (!) 181/119 (BP Location: Right Arm)   Pulse 84   Temp 98.4 F (36.9 C) (Oral)   Resp 18   Ht 5' 7 (1.702 m)   Wt 122.5 kg   SpO2 94%   BMI 42.29 kg/m  Gen:   Awake, no distress   Resp:  Normal effort  MSK:   Moves extremities without difficulty  Other:    Medical Decision Making  Medically screening exam initiated at 3:34 PM.  Appropriate orders placed.  [name] was informed that the remainder of the evaluation will be completed by another provider, this initial triage assessment does not replace that evaluation, and the importance of remaining in the ED until their evaluation is complete.     Stacy Colon, PA-C 04/01/24 1536

## 2024-04-03 ENCOUNTER — Emergency Department (HOSPITAL_COMMUNITY)
Admission: EM | Admit: 2024-04-03 | Discharge: 2024-04-03 | Disposition: A | Discharge status: Home / Self Care | Payer: MEDICAID | Attending: Emergency Medicine | Admitting: Emergency Medicine

## 2024-04-03 ENCOUNTER — Other Ambulatory Visit: Payer: Self-pay

## 2024-04-03 DIAGNOSIS — I10 Essential (primary) hypertension: Secondary | ICD-10-CM | POA: Insufficient documentation

## 2024-04-03 DIAGNOSIS — N898 Other specified noninflammatory disorders of vagina: Secondary | ICD-10-CM | POA: Insufficient documentation

## 2024-04-03 DIAGNOSIS — E119 Type 2 diabetes mellitus without complications: Secondary | ICD-10-CM | POA: Insufficient documentation

## 2024-04-03 DIAGNOSIS — Z7984 Long term (current) use of oral hypoglycemic drugs: Secondary | ICD-10-CM | POA: Insufficient documentation

## 2024-04-03 DIAGNOSIS — Z79899 Other long term (current) drug therapy: Secondary | ICD-10-CM | POA: Insufficient documentation

## 2024-04-03 DIAGNOSIS — Z794 Long term (current) use of insulin: Secondary | ICD-10-CM | POA: Insufficient documentation

## 2024-04-03 LAB — URINALYSIS, ROUTINE W REFLEX MICROSCOPIC
Bacteria, UA: NONE SEEN
Bilirubin Urine: NEGATIVE
Glucose, UA: >=500 mg/dL — AB
Hgb urine dipstick: NEGATIVE
Ketones, ur: NEGATIVE mg/dL
Leukocytes,Ua: NEGATIVE
Nitrite: NEGATIVE
Protein, ur: NEGATIVE mg/dL
Specific Gravity, Urine: 1.028 (ref 1.005–1.030)
pH: 5 (ref 5.0–8.0)

## 2024-04-03 LAB — GC/CHLAMYDIA PROBE AMP (~~LOC~~) NOT AT ARMC
Chlamydia: NEGATIVE
Comment: NEGATIVE
Comment: NORMAL
Neisseria Gonorrhea: NEGATIVE

## 2024-04-03 LAB — WET PREP, GENITAL
Clue Cells Wet Prep HPF POC: NONE SEEN
Sperm: NONE SEEN
Trich, Wet Prep: NONE SEEN
WBC, Wet Prep HPF POC: <10 (ref <10)
Yeast Wet Prep HPF POC: NONE SEEN

## 2024-04-03 LAB — PREGNANCY, URINE: Preg Test, Ur: NEGATIVE

## 2024-04-03 NOTE — Discharge Instructions (Addendum)
 Your workup today did not show evidence of bacterial vaginosis, yeast infection.  Your urinalysis was negative for urinary tract infection.  We recommend follow-up with your primary care doctor should vaginal irritation and discharge persist.  You will be notified if your STD tests return positive.  Return to the ED for any new or concerning symptoms.

## 2024-04-03 NOTE — ED Provider Notes (Signed)
 [facility] EMERGENCY DEPARTMENT AT [facility] Provider Note   CSN: [id number] Arrival date & time: 04/03/24  9788     Patient presents with: Vaginal Itching   [name] is a 33 y.o. female.   33 year old female presents to the emergency department for evaluation of vaginal itching and discharge.  She describes the discharge as more clear than in the past; has presented to the ED for vaginal discharge previously.  Reports that the symptoms have been ongoing for the past few weeks.  She was evaluated in the ED a few days ago for an unrelated complaint.  States that she did not bring up the symptoms at the time, but that they are worse now.  She has had some sporadic burning dysuria, but without other urinary symptoms.  No fevers, vomiting.  The history is provided by the patient. No language interpreter was used.  Vaginal Itching       Prior to Admission medications   Medication Sig Start Date End Date Taking? Authorizing Provider  amLODipine  (NORVASC ) 10 MG tablet Take 1 tablet (10 mg total) by mouth daily. 04/13/23   Rollene Katz, MD  atorvastatin  (LIPITOR) 20 MG tablet Take 1 tablet (20 mg total) by mouth at bedtime. Patient not taking: Reported on 03/14/2024 04/13/23   Rollene Katz, MD  benztropine  (COGENTIN ) 0.5 MG tablet Take 1 tablet (0.5 mg total) by mouth 2 (two) times daily. For eps Patient not taking: Reported on 03/14/2024 04/13/23   Rollene Katz, MD  cephALEXin  (KEFLEX ) 500 MG capsule Take 1 capsule (500 mg total) by mouth 2 (two) times daily. Patient not taking: Reported on 03/14/2024 12/19/23   Francis Ileana SAILOR, PA-C  cloNIDine  (CATAPRES ) 0.2 MG tablet Take 0.5 tablets (0.1 mg total) by mouth at bedtime. 02/11/24   [name], MD  diclofenac  Sodium (VOLTAREN ) 1 % GEL Apply 4 g topically 4 (four) times daily. Patient not taking: Reported on 11/17/2023 04/13/23   Rollene Katz, MD  fluvoxaMINE  (LUVOX ) 50 MG tablet Take 1  tablet (50 mg total) by mouth at bedtime. 02/11/24   [name], MD  gabapentin  (NEURONTIN ) 300 MG capsule Take 1 capsule (300 mg total) by mouth 3 (three) times daily. 02/11/24   [name], MD  glimepiride  (AMARYL ) 4 MG tablet Take 1 tablet (4 mg total) by mouth daily. Patient not taking: Reported on 03/14/2024 04/13/23   Rollene Katz, MD  GLOBAL EASE INJECT PEN NEEDLES 32G X 4 MM MISC Inject 1 each into the skin as directed. 10/20/22   [provider]  haloperidol  (HALDOL ) 10 MG tablet Take 1 tablet (10 mg total) by mouth at bedtime for 14 days. 04/13/23 11/17/23  Rollene Katz, MD  insulin  aspart protamine- aspart (NOVOLOG  MIX 70/30) (70-30) 100 UNIT/ML injection Inject 40 Units into the skin every evening.    [provider]  INVEGA  SUSTENNA 156 MG/ML SUSY injection SMARTSIG:1 IM Once Patient not taking: Reported on 03/14/2024 11/26/23   [provider]  lithium  carbonate (ESKALITH ) 450 MG ER tablet Take 1 tablet (450 mg total) by mouth at bedtime. For mood stabilization Patient not taking: Reported on 03/14/2024 04/13/23   Rollene Katz, MD  loperamide  (IMODIUM ) 2 MG capsule Take 1 capsule (2 mg total) by mouth 4 (four) times daily as needed for diarrhea or loose stools. Patient not taking: Reported on 03/14/2024 09/10/23   Emil Share, DO  metoprolol  tartrate (LOPRESSOR ) 25 MG tablet Take 1 tablet (25 mg total) by mouth 2 (  two) times daily for 14 days. 04/13/23 11/17/23  Rollene Katz, MD  metroNIDAZOLE  (FLAGYL ) 500 MG tablet Take 1 tablet (500 mg total) by mouth 2 (two) times daily. Patient not taking: Reported on 03/14/2024 02/04/24   Myriam Dorn BROCKS, PA  OLANZapine  (ZYPREXA ) 10 MG tablet Take 3 tablets (30 mg total) by mouth at bedtime. Patient not taking: Reported on 03/14/2024 02/11/24   [name], MD  ondansetron  (ZOFRAN -ODT) 4 MG disintegrating tablet 4mg  ODT q4 hours prn nausea/vomit Patient not taking: Reported  on 03/14/2024 09/10/23   Emil Share, DO  traZODone  (DESYREL ) 150 MG tablet Take 1 tablet (150 mg total) by mouth at bedtime. 04/13/23   Rollene Katz, MD  venlafaxine  XR (EFFEXOR -XR) 150 MG 24 hr capsule Take 1 capsule (150 mg total) by mouth daily with breakfast. Patient not taking: Reported on 03/14/2024 04/13/23   Rollene Katz, MD    Allergies: Wellbutrin  Blas.breath ], Buspar  [buspirone ], Contrast media [iodinated contrast media], Omnipaque  [iohexol ], Penicillins, Atarax  [hydroxyzine ], and Cymbalta  [duloxetine  hcl]    Review of Systems Ten systems reviewed and are negative for acute change, except as noted in the HPI.    Updated Vital Signs BP (!) 155/96 (BP Location: Right Arm)   Pulse 85   Temp 98 F (36.7 C) (Oral)   Resp 18   Ht 5' 7 (1.702 m)   Wt 122.5 kg   SpO2 95%   BMI 42.29 kg/m   Physical Exam Vitals and nursing note reviewed.  Constitutional:      General: She is not in acute distress.    Appearance: She is well-developed. She is not diaphoretic.     Comments: Nontoxic appearing, obese female  HENT:     Head: Normocephalic and atraumatic.  Eyes:     General: No scleral icterus.    Conjunctiva/sclera: Conjunctivae normal.  Pulmonary:     Effort: Pulmonary effort is normal. No respiratory distress.     Comments: Respirations even and unlabored Musculoskeletal:        General: Normal range of motion.     Cervical back: Normal range of motion.  Skin:    General: Skin is warm and dry.     Coloration: Skin is not pale.     Findings: No erythema or rash.  Neurological:     Mental Status: She is alert and oriented to person, place, and time.     Coordination: Coordination normal.  Psychiatric:        Behavior: Behavior normal.     (all labs ordered are listed, but only abnormal results are displayed) Labs Reviewed  URINALYSIS, ROUTINE W REFLEX MICROSCOPIC - Abnormal; Notable for the following components:      Result Value   Color, Urine STRAW  (*)    Glucose, UA >=500 (*)    All other components within normal limits  WET PREP, GENITAL  PREGNANCY, URINE  GC/CHLAMYDIA PROBE AMP ([place]) NOT AT [facility]    EKG: None  Radiology: No results found.   Procedures   Medications Ordered in the ED - No data to display                                  Medical Decision Making Amount and/or Complexity of Data Reviewed Labs: ordered.   This patient presents to the ED for concern of vaginal itching and discharge, this involves an extensive number of treatment options, and is a complaint that carries  with it a high risk of complications and morbidity.  The differential diagnosis includes STI vs yeast infection vs BV vs UTI   Co morbidities that complicate the patient evaluation  Bipolar disorder HTN DM   Additional history obtained:  Additional history obtained from EMS personnel External records from outside source obtained and reviewed including prior discharge summaries   Lab Tests:  I Ordered, and personally interpreted labs.  The pertinent results include:  Wet prep and UA negative   Cardiac Monitoring:  The patient was maintained on a cardiac monitor.  I personally viewed and interpreted the cardiac monitored which showed an underlying rhythm of: NSR   Medicines ordered and prescription drug management:  I have reviewed the patients home medicines and have made adjustments as needed   Test Considered:  CBC   Problem List / ED Course:  C/o vaginal discharge and irritation. Seen frequently in the ED for same.  She has had 4 GC/Chlamydia studies in the past 6 months, all of which have been negative. No pyuria on UA today. No WBCs on wet prep. GC/Chlamydia today is pending, though do not feel she necessitates prophylactic coverage for STIs at this time. Wet prep without concern for yeast vaginitis, trichomoniasis, BV. UA negative for UTI. Given recurrent nature of complaint, chronicity, reassuring  evaluation - patient stable to f/u further as outpatient with PCP and/or OBGYN   Reevaluation:  After the interventions noted above, I reevaluated the patient and found that they have :stayed the same   Social Determinants of Health:  Lives independently   Dispostion:  After consideration of the diagnostic results and the patients response to treatment, I feel that the patent would benefit from outpatient PCP f/u for reassessment. Return precautions provided. Patient discharged in stable condition with no unaddressed concerns.       Final diagnoses:  Vaginal discharge    ED Discharge Orders     None          Keith Sor, PA-C 04/03/24 0557    Raford Lenis, MD 04/03/24 [phone]

## 2024-04-03 NOTE — ED Triage Notes (Signed)
 PT BIB EMS. Pt c.o of vaginal itching and burning. Pt states that she is also having discharge. This has been going on for 3 months. States having yeast infection recently but believes has not resolves. No other complaints. Pt's  LMP last week of November.   EMS: 140/80, HR 98, RR 18, Spo2 98%, CBG 328

## 2024-04-05 ENCOUNTER — Emergency Department (HOSPITAL_COMMUNITY): Payer: MEDICAID

## 2024-04-05 ENCOUNTER — Other Ambulatory Visit: Payer: Self-pay

## 2024-04-05 ENCOUNTER — Encounter (HOSPITAL_COMMUNITY): Payer: Self-pay | Admitting: Emergency Medicine

## 2024-04-05 ENCOUNTER — Emergency Department (HOSPITAL_COMMUNITY)
Admission: EM | Admit: 2024-04-05 | Discharge: 2024-04-05 | Discharge status: Against Medical Advice | Payer: MEDICAID | Attending: Emergency Medicine | Admitting: Emergency Medicine

## 2024-04-05 DIAGNOSIS — R0789 Other chest pain: Secondary | ICD-10-CM | POA: Diagnosis present

## 2024-04-05 DIAGNOSIS — F17290 Nicotine dependence, other tobacco product, uncomplicated: Secondary | ICD-10-CM | POA: Diagnosis not present

## 2024-04-05 DIAGNOSIS — Z5321 Procedure and treatment not carried out due to patient leaving prior to being seen by health care provider: Secondary | ICD-10-CM | POA: Insufficient documentation

## 2024-04-05 DIAGNOSIS — R0602 Shortness of breath: Secondary | ICD-10-CM | POA: Insufficient documentation

## 2024-04-05 LAB — CBC
HCT: 39.3 % (ref 36.0–46.0)
Hemoglobin: 12.7 g/dL (ref 12.0–15.0)
MCH: 25.6 pg — ABNORMAL LOW (ref 26.0–34.0)
MCHC: 32.3 g/dL (ref 30.0–36.0)
MCV: 79.1 fL — ABNORMAL LOW (ref 80.0–100.0)
Platelets: 227 K/uL (ref 150–400)
RBC: 4.97 MIL/uL (ref 3.87–5.11)
RDW: 15.2 % (ref 11.5–15.5)
WBC: 8.6 K/uL (ref 4.0–10.5)
nRBC: 0 % (ref 0.0–0.2)

## 2024-04-05 LAB — BASIC METABOLIC PANEL WITH GFR
Anion gap: 8 (ref 5–15)
BUN: 16 mg/dL (ref 6–20)
CO2: 24 mmol/L (ref 22–32)
Calcium: 8.4 mg/dL — ABNORMAL LOW (ref 8.9–10.3)
Chloride: 103 mmol/L (ref 98–111)
Creatinine, Ser: 0.94 mg/dL (ref 0.44–1.00)
GFR, Estimated: >60 mL/min (ref >60)
Glucose, Bld: 292 mg/dL — ABNORMAL HIGH (ref 70–99)
Potassium: 3.9 mmol/L (ref 3.5–5.1)
Sodium: 135 mmol/L (ref 135–145)

## 2024-04-05 LAB — TROPONIN I (HIGH SENSITIVITY): Troponin I (High Sensitivity): 6 ng/L (ref <18)

## 2024-04-05 LAB — HCG, SERUM, QUALITATIVE: Preg, Serum: NEGATIVE

## 2024-04-05 NOTE — ED Notes (Signed)
 Called PT three times and no answer.[email or web address]

## 2024-04-05 NOTE — ED Triage Notes (Signed)
 Patient from home BIB [facility] with R sided chest pain that started 2 hours ago while she was smoking a cigar. The patient has decreased now, but states my chest still feels funny. Reports it as intermittent. Endorses [facility].

## 2024-04-08 ENCOUNTER — Ambulatory Visit (HOSPITAL_COMMUNITY)
Admission: EM | Admit: 2024-04-08 | Discharge: 2024-04-08 | Disposition: A | Discharge status: Home / Self Care | Payer: MEDICAID | Attending: Psychiatry | Admitting: Psychiatry

## 2024-04-08 DIAGNOSIS — R45851 Suicidal ideations: Secondary | ICD-10-CM

## 2024-04-08 DIAGNOSIS — F411 Generalized anxiety disorder: Secondary | ICD-10-CM

## 2024-04-08 NOTE — Discharge Instructions (Addendum)

## 2024-04-08 NOTE — Progress Notes (Signed)
°   04/08/24 1652  [facility] Triage Screening (Walk-ins at [facility] only)  How Did You Hear About Us ? Legal System  What Is the Reason for Your Visit/Call Today? [name] is a 33 yo female BIB [employer] to [facility] for BH Assessment due to complaint of SI. Pt is calm, making good eye contact, cooperative, sitting drinking water . Pt stated she doesn't know why she feels anxious today but she just does and thought about taking all her pills. When asked if there was a specific trigger to her anxiety and SI she stated she was just tired of her life. Pt stated she is taking medications as prescribed. Pt has a ACTT team and tried calling them but nobody returned her call. Pt stated she doesn't want to go to [facility] if admitted because it is too far away. Pt stated that she does not feel  like she would leave and harm herself if discharged. She stated she felt that way earlier but not right now. Pt stated she no longer uses any substances, stating the last time she used was her last admission but doesn't remember a date or month of when cocaine use stopped. Pt denied HI/AVH but did endorse some delusions stating she feels like Nancyann Frohlich is watching her and wants to take away her rights, she stated he is her husband and people hate him but she loves her husband. PT stated she would like to go live in a group home.  How Long Has This Been Causing You Problems? > than 6 months  Have You Recently Had Any Thoughts About Hurting Yourself? Yes  How long ago did you have thoughts about hurting yourself? this morning, I thought of taking all my medicine.  Are You Planning to Commit Suicide/Harm Yourself At This time? No  Have you Recently Had Thoughts About Hurting Someone Sherral? No  Are You Planning To Harm Someone At This Time? No  Physical Abuse Denies  Verbal Abuse Denies  Sexual Abuse Denies  Exploitation of patient/patient's resources Denies  Self-Neglect Denies  Possible abuse reported to:  (n/a)  Are you currently  experiencing any auditory, visual or other hallucinations? No  Have You Used Any Alcohol or Drugs in the Past 24 Hours? No  Do you have any current medical co-morbidities that require immediate attention? Yes  Please describe current medical co-morbidities that require immediate attention: diabetes, High blood pressure  Clinician description of patient physical appearance/behavior: Flat, cooperative, calm, slow speech, good eye contact.  What Do You Feel Would Help You the Most Today? Treatment for Depression or other mood problem (anxiety)  If access to [facility] was not available, would you have sought care in the Emergency Department? Yes  Determination of Need Routine (7 days)  Options For Referral Medication Management

## 2024-04-08 NOTE — ED Provider Notes (Signed)
 Behavioral Health Urgent Care Medical Screening Exam  Patient Name: [name] MRN: [id number] Date of Evaluation: 04/08/2024 Chief Complaint:  Panic attack  Diagnosis:  Final diagnoses:  Anxiety state  Passive suicidal ideations    History of Present illness: [name] 33 y.o., female patient presented to [facility] as a voluntary walk in accompanied by [employer] with complaints of having a panic attack that led to her having some passive and situational suicidal ideations [name], is seen face to face by this provider, consulted with Dr. Lawrnce; and chart reviewed on 04/08/2024.  Patient is well-known to this facility as well as [facility] ED and has an active ED care plan for when she presents.  Patient has a past psychiatric history of anxiety, schizoaffective disorder, bipolar type, depression, cognitive deficits, substance abuse and history of suicide attempt.  Patient is currently being treated in the community with outpatient services by [facility] ACT team.  Most recent inpatient hospitalization was in November 2025 at [facility].  On evaluation [name] reports that she was having some issues with family and an ex-boyfriend that caused her to feel very stressed out.  She reports that she attempted to call her ACT team to process and help her de-escalate however they did not respond at that time.  Patient states that she freaked out and called the police to bring her here so that she could talk to somebody and avoid suicidal ideations.  Patient reports that she is currently feeling much better and calm.  She denies current suicidal ideations, homicidal ideations and psychotic symptoms.  Patient reports that she has been compliant with her medication and received her long-acting Invega  injection upon discharging from [facility].  Patient reports that she currently lives on her own.  Patient denies recent substance use but does endorse a history of  substance abuse including cocaine which she last used months ago. Pt is requesting to discharge and return home so that she can follow up with ACT Team tmrw.  Patient provided consent for provider to speak with ACT team.  This provider's spoke with Erminio from [facility] ACT Team , [phone].  Erminio was able to verify that patient did reach out multiple times today to discuss stressors but that she was unable to answer the phone at the time and by the time she called back patient has already called for transportation to this facility.  Erminio reports that she did meet with patient 1 to 2 days ago and felt that she was doing well.  She has been compliant with her medications and has been receiving her Invega  long-acting injection.  Denies having any immediate safety concerns and plans to meet with patient within the next week.  Per Erminio, [facility] no longer allows staff to pick up patients discharged from hospital.   During evaluation [name] is sitting in assessment room, in no acute distress.  She is alert & oriented x 4, calm, cooperative and attentive for this assessment.  Her mood is anxious with congruent affect.  She has normal speech, and behavior.  Objectively there is no evidence of psychosis/mania or delusional thinking. Pt does not appear to be responding to internal or external stimuli.  Patient is able to converse coherently, goal directed thoughts, no distractibility, or pre-occupation.  She also denies current suicidal/self-harm/homicidal ideation, psychosis, and paranoia.  Patient answered assessment questions appropriately.     Flowsheet Row ED from 04/05/2024 in [facility] Emergency  Department at [facility] ED from 04/03/2024 in [facility] Emergency Department at [facility] ED from 04/01/2024 in [facility] Emergency Department at [facility]  C-SSRS RISK CATEGORY No Risk No Risk No Risk    Psychiatric Specialty  Exam  Presentation  General Appearance:Appropriate for Environment  Eye Contact:Good  Speech:Clear and Coherent  Speech Volume:Normal  Handedness:Right   Mood and Affect  Mood: Euthymic  Affect: Appropriate   Thought Process  Thought Processes: Coherent; Linear  Descriptions of Associations:Intact  Orientation:Full (Time, Place and Person)  Thought Content:Delusions  Diagnosis of Schizophrenia or Schizoaffective disorder in past: Yes  Duration of Psychotic Symptoms: Greater than six months  Hallucinations:None hear people  calling me a balded headed bitch  Ideas of Reference:None  Suicidal Thoughts:No With Plan (overdose on home medications) With Intent  Homicidal Thoughts:No   Sensorium  Memory: Immediate Fair; Recent Fair  Judgment: Fair  Insight: Lacking   Executive Functions  Concentration: Fair  Attention Span: Fair  Recall: [employer] of Knowledge: Fair  Language: Fair   Psychomotor Activity  Psychomotor Activity: Normal   Assets  Assets: Desire for Improvement; Housing; Social Support; Manufacturing Systems Engineer; Financial Resources/Insurance; Leisure Time; Resilience   Sleep  Sleep: Good  Number of hours:  8   Physical Exam: Physical Exam Vitals and nursing note reviewed.  Constitutional:      Appearance: Normal appearance. She is obese.  HENT:     Head: Normocephalic.     Nose: Nose normal.  Eyes:     Extraocular Movements: Extraocular movements intact.  Cardiovascular:     Rate and Rhythm: Tachycardia present.  Pulmonary:     Effort: Pulmonary effort is normal.  Musculoskeletal:        General: Normal range of motion.     Cervical back: Normal range of motion.  Neurological:     General: No focal deficit present.     Mental Status: She is alert and oriented to person, place, and time.    Review of Systems  Constitutional: Negative.   HENT: Negative.    Eyes: Negative.   Respiratory: Negative.     Cardiovascular: Negative.   Gastrointestinal: Negative.   Genitourinary: Negative.   Musculoskeletal: Negative.   Neurological: Negative.   Endo/Heme/Allergies: Negative.   Psychiatric/Behavioral:  Positive for depression. The patient is nervous/anxious.    Blood pressure (!) 153/96, pulse (!) 101, temperature 99.1 F (37.3 C), temperature source Oral, resp. rate 18, SpO2 98%. There is no height or weight on file to calculate BMI.  Musculoskeletal: Strength & Muscle Tone: within normal limits Gait & Station: normal Patient leans: N/A   [facility] MSE Discharge Disposition for Follow up and Recommendations: Based on my evaluation the patient does not appear to have an emergency medical condition and can be discharged with resources and follow up care in outpatient services for Medication Management and Individual Therapy  Patient discharged home via taxi.  Patient able to contract for safety upon discharge denies suicidal ideations homicidal ideations and psychotic symptoms at this time.  No medication changes made at this visit.  Provider spoke with Erminio, from ACT team to verify services.  Patient agrees to meet with ACT team within the next few days.  Safety plan completed as follows: Safety Plan [name] will reach out to her ACTT counselor, call 911 or call mobile crisis, or go to nearest emergency room if condition worsens or if suicidal thoughts become active Patients' will follow up with  [facility] for outpatient psychiatric services (therapy/medication management).  The suicide prevention education provided includes the following: Suicide risk factors Suicide prevention and interventions National Suicide Hotline telephone number [facility] assessment telephone number [facility] Emergency Assistance 911 [facility] and/or Residential Mobile Crisis Unit telephone number Request made of family/significant other to:  Remove  weapons (e.g., guns, rifles, knives), all items previously/currently identified as safety concern.   Remove drugs/medications (over the counter, prescriptions, illicit drugs), all items previously/currently identified as a safety concern.   Alan JAYSON Mcardle, NP 04/08/2024, 5:47 PM

## 2024-04-28 ENCOUNTER — Ambulatory Visit (HOSPITAL_COMMUNITY)
Admission: EM | Admit: 2024-04-28 | Discharge: 2024-04-29 | Disposition: A | Discharge status: Home / Self Care | Payer: MEDICAID | Attending: Urology | Admitting: Urology

## 2024-04-28 ENCOUNTER — Other Ambulatory Visit: Payer: Self-pay

## 2024-04-28 DIAGNOSIS — F331 Major depressive disorder, recurrent, moderate: Secondary | ICD-10-CM | POA: Insufficient documentation

## 2024-04-28 LAB — POCT URINE DRUG SCREEN - MANUAL ENTRY (I-SCREEN)
POC Amphetamine UR: NOT DETECTED
POC Buprenorphine (BUP): NOT DETECTED
POC Cocaine UR: NOT DETECTED
POC Marijuana UR: NOT DETECTED
POC Methadone UR: NOT DETECTED
POC Methamphetamine UR: NOT DETECTED
POC Morphine: NOT DETECTED
POC Oxazepam (BZO): NOT DETECTED
POC Oxycodone UR: NOT DETECTED
POC Secobarbital (BAR): NOT DETECTED

## 2024-04-28 LAB — COMPREHENSIVE METABOLIC PANEL WITH GFR
ALT: 36 U/L (ref 0–44)
AST: 31 U/L (ref 15–41)
Albumin: 4.4 g/dL (ref 3.5–5.0)
Alkaline Phosphatase: 59 U/L (ref 38–126)
Anion gap: 12 (ref 5–15)
BUN: 11 mg/dL (ref 6–20)
CO2: 26 mmol/L (ref 22–32)
Calcium: 9.4 mg/dL (ref 8.9–10.3)
Chloride: 102 mmol/L (ref 98–111)
Creatinine, Ser: 0.71 mg/dL (ref 0.44–1.00)
GFR, Estimated: >60 mL/min
Glucose, Bld: 81 mg/dL (ref 70–99)
Potassium: 3.7 mmol/L (ref 3.5–5.1)
Sodium: 139 mmol/L (ref 135–145)
Total Bilirubin: 0.4 mg/dL (ref 0.0–1.2)
Total Protein: 7.2 g/dL (ref 6.5–8.1)

## 2024-04-28 LAB — CBC WITH DIFFERENTIAL/PLATELET
Abs Immature Granulocytes: 0.02 K/uL (ref 0.00–0.07)
Basophils Absolute: 0.1 K/uL (ref 0.0–0.1)
Basophils Relative: 1 %
Eosinophils Absolute: 0.3 K/uL (ref 0.0–0.5)
Eosinophils Relative: 3 %
HCT: 40.3 % (ref 36.0–46.0)
Hemoglobin: 13.3 g/dL (ref 12.0–15.0)
Immature Granulocytes: 0 %
Lymphocytes Relative: 41 %
Lymphs Abs: 4 K/uL (ref 0.7–4.0)
MCH: 26.1 pg (ref 26.0–34.0)
MCHC: 33 g/dL (ref 30.0–36.0)
MCV: 79.2 fL — ABNORMAL LOW (ref 80.0–100.0)
Monocytes Absolute: 0.7 K/uL (ref 0.1–1.0)
Monocytes Relative: 7 %
Neutro Abs: 4.5 K/uL (ref 1.7–7.7)
Neutrophils Relative %: 48 %
Platelets: 207 K/uL (ref 150–400)
RBC: 5.09 MIL/uL (ref 3.87–5.11)
RDW: 14.9 % (ref 11.5–15.5)
WBC: 9.6 K/uL (ref 4.0–10.5)
nRBC: 0 % (ref 0.0–0.2)

## 2024-04-28 LAB — POC URINE PREG, ED: Preg Test, Ur: NEGATIVE

## 2024-04-28 LAB — ETHANOL: Alcohol, Ethyl (B): <15 mg/dL

## 2024-04-28 MED ORDER — HALOPERIDOL LACTATE 5 MG/ML IJ SOLN: 5.0000 mg | Freq: Three times a day (TID) | INTRAMUSCULAR | Status: DC | PRN

## 2024-04-28 MED ORDER — ACETAMINOPHEN 325 MG PO TABS: 650.0000 mg | ORAL_TABLET | Freq: Four times a day (QID) | ORAL | Status: DC | PRN

## 2024-04-28 MED ORDER — LORAZEPAM 2 MG/ML IJ SOLN: 2.0000 mg | Freq: Three times a day (TID) | INTRAMUSCULAR | Status: DC | PRN

## 2024-04-28 MED ORDER — HALOPERIDOL 5 MG PO TABS: 5.0000 mg | ORAL_TABLET | Freq: Three times a day (TID) | ORAL | Status: DC | PRN

## 2024-04-28 MED ORDER — DIPHENHYDRAMINE HCL 25 MG PO CAPS: 25.0000 mg | ORAL_CAPSULE | Freq: Four times a day (QID) | ORAL | Status: DC | PRN

## 2024-04-28 MED ORDER — HALOPERIDOL LACTATE 5 MG/ML IJ SOLN: 10.0000 mg | Freq: Three times a day (TID) | INTRAMUSCULAR | Status: DC | PRN

## 2024-04-28 MED ORDER — DIPHENHYDRAMINE HCL 50 MG/ML IJ SOLN: 50.0000 mg | Freq: Three times a day (TID) | INTRAMUSCULAR | Status: DC | PRN

## 2024-04-28 MED ORDER — TRAZODONE HCL 50 MG PO TABS: 50.0000 mg | ORAL_TABLET | Freq: Every evening | ORAL | Status: DC | PRN

## 2024-04-28 MED ORDER — MAGNESIUM HYDROXIDE 400 MG/5ML PO SUSP: 30.0000 mL | Freq: Every day | ORAL | Status: DC | PRN

## 2024-04-28 MED ORDER — ALUM & MAG HYDROXIDE-SIMETH 200-200-20 MG/5ML PO SUSP: 30.0000 mL | ORAL | Status: DC | PRN

## 2024-04-28 MED ORDER — DIPHENHYDRAMINE HCL 50 MG PO CAPS: 50.0000 mg | ORAL_CAPSULE | Freq: Three times a day (TID) | ORAL | Status: DC | PRN

## 2024-04-28 NOTE — BH Assessment (Signed)
 Comprehensive Clinical Assessment (CCA) Note  04/28/2024 [name] [id number]  Disposition: Kathryne Show, NP recommends pt to be admitted to [facility] for Observation.   The patient demonstrates the following risk factors for suicide: Chronic risk factors for suicide include: psychiatric disorder of Bipolar I disorder, most recent episode depressed (HCC) and previous suicide attempts Pt has previous suicidal ideations. Acute risk factors for suicide include: Pt is currently suicidal. Protective factors for this patient include: positive therapeutic relationship. Considering these factors, the overall suicide risk at this point appears to be high. Patient is not appropriate for outpatient follow up.  [name] is a 34 year old female who presents voluntary and unaccompanied to [facility] ([facility] Clinician asked the pt, what brought you to the hospital? Pt reports, she was suicidal with a plan to overdose on her medications. Pt reports, a lot of anxiety, family problems, her mother has been mean to her. Pt reports, her mother lets her sister come over. Pt reports, wanting her medications changed. Pt denies, HI, hallucinations, self-injurious behaviors and access to weapons.   Pt denies, substance use. Pt reports, she will call her ACT Team ([facility]) to reassess her medications. Pt has previous inpatient treatment and [facility] admissions.   Pt presents alert, laying on the assessment chair with normal speech and eye contact. Pt's mood was euthymic. Pt's affect was congruent. Pt's insight was fair. Pt's judgement was poor.   Chief Complaint:  Chief Complaint  Patient presents with   Suicidal   Visit Diagnosis: Bipolar I disorder, most recent episode depressed (HCC).   CCA Screening, Triage and Referral (STR)  Patient Reported Information How did you hear about us ? Self  What Is the Reason for Your Visit/Call Today? Patient is a  34 year old female that presents voluntary by [employer] reporting ongoing SI with a plan to overdose on her medications. Patient denies any HI or AVH. When asked what prompted thoughts of self harm patient stated, just life, patient also reports excessive anxiety stating her medications are not working. Patient is observed to be liable and cries followed by laughing but is pleasant stating, she needs help right now. Patient denies any current SA use and reports she is compliant with her medications. Patient states she is, thirsty and needs five cups of water . This clinical research associate provider her with two. Patient states she is intent on ending her life stating, you know I will do it.  How Long Has This Been Causing You Problems? <Week  What Do You Feel Would Help You the Most Today? Treatment for Depression or other mood problem   Have You Recently Had Any Thoughts About Hurting Yourself? Yes  Are You Planning to Commit Suicide/Harm Yourself At This time? Yes   Flowsheet Row ED from 04/28/2024 in [facility] ED from 04/05/2024 in [facility] Emergency Department at [facility] ED from 04/03/2024 in [facility] Emergency Department at [facility]  C-SSRS RISK CATEGORY High Risk No Risk No Risk    Have you Recently Had Thoughts About Hurting Someone Sherral? No  Are You Planning to Harm Someone at This Time? No  Explanation: NA   Have You Used Any Alcohol or Drugs in the Past 24 Hours? No  How Long Ago Did You Use Drugs or Alcohol? Pt denies.  What Did You Use and How Much? Pt denies.   Do You Currently Have a Therapist/Psychiatrist? Yes  Name of Therapist/Psychiatrist: Name  of Therapist/Psychiatrist: [facility] ACT [facility].   Have You Been Recently Discharged From Any Office Practice or Programs? No  Explanation of Discharge From Practice/Program: NA    CCA Screening Triage Referral Assessment Type of Contact: Face-to-Face  Telemedicine  Service Delivery:   Is this Initial or Reassessment?   Date Telepsych consult ordered in [facility]:    Time Telepsych consult ordered in CHL:    Location of Assessment: [facility]  Provider Location: [facility] Assessment Services   Collateral Involvement: NA   Does Patient Have a Automotive Engineer Guardian? No  Legal Guardian Contact Information: NA  Copy of Legal Guardianship Form: -- (NA)  Legal Guardian Notified of Arrival: -- (NA)  Legal Guardian Notified of Pending Discharge: -- (NA)  If Minor and Not Living with Parent(s), Who has Custody? NA  Is CPS involved or ever been involved? Never  Is APS involved or ever been involved? Never   Patient Determined To Be At Risk for Harm To Self or Others Based on Review of Patient Reported Information or Presenting Complaint? Yes, for Self-Harm  Method: Plan with intent and identified person  Availability of Means: Has close by  Intent: Clearly intends on inflicting harm that could cause death  Notification Required: No need or identified person  Additional Information for Danger to Others Potential: -- (NA)  Additional Comments for Danger to Others Potential: NA  Are There Guns or Other Weapons in Your Home? No  Types of Guns/Weapons: Pt denies.  Are These Weapons Safely Secured?                            -- (NA)  Who Could Verify You Are Able To Have These Secured: NA  Do You Have any Outstanding Charges, Pending Court Dates, Parole/Probation? Pt denies.  Contacted To Inform of Risk of Harm To Self or Others: Other: Comment (NA)    Does Patient Present under Involuntary Commitment? No    [place] of Residence: [place]   Patient Currently Receiving the Following Services: ACTT Psychologist, Educational); Medication Management   Determination of Need: Urgent (48 hours)   Options For Referral: Inpatient Hospitalization; [facility]; Medication Management; Outpatient Therapy (To be  determined)     CCA Biopsychosocial Patient Reported Schizophrenia/Schizoaffective Diagnosis in Past: No   Strengths: Pt agreeable to help.   Mental Health Symptoms Depression:  Irritability; Sleep (too much or little); Increase/decrease in appetite; Tearfulness   Duration of Depressive symptoms:    Mania:  None   Anxiety:   Irritability; Sleep; Worrying   Psychosis:  None   Duration of Psychotic symptoms: Duration of Psychotic Symptoms: N/A   Trauma:  None   Obsessions:  None   Compulsions:  None   Inattention:  N/A   Hyperactivity/Impulsivity:  Feeling of restlessness; Fidgets with hands/feet   Oppositional/Defiant Behaviors:  Angry; Argumentative; Temper   Emotional Irregularity:  Recurrent suicidal behaviors/gestures/threats; Potentially harmful impulsivity; Mood lability   Other Mood/Personality Symptoms:  NA    Mental Status Exam Appearance and self-care  Stature:  Average   Weight:  Obese   Clothing:  Casual   Grooming:  Normal   Cosmetic use:  Age appropriate   Posture/gait:  Normal   Motor activity:  Not Remarkable   Sensorium  Attention:  Normal   Concentration:  Normal   Orientation:  X5   Recall/memory:  Normal   Affect and Mood  Affect:  Congruent  Mood:  Euthymic   Relating  Eye contact:  Normal   Facial expression:  Responsive   Attitude toward examiner:  Cooperative   Thought and Language  Speech flow: Normal   Thought content:  Appropriate to Mood and Circumstances   Preoccupation:  None   Hallucinations:  None   Organization:  Coherent   [employer] of Knowledge:  Fair   Intelligence:  Average (per chart: Borderline Intellectual Functioning)   Abstraction:  Functional   Judgement:  Poor   Reality Testing:  Distorted   Insight:  Fair   Decision Making:  Impulsive   Social Functioning  Social Maturity:  Impulsive; Irresponsible   Social Judgement:  Heedless   Stress  Stressors:   Family conflict   Coping Ability:  Overwhelmed   Skill Deficits:  Self-control; Interpersonal; Communication; Decision making   Supports:  Family     Religion: Religion/Spirituality Are You A Religious Person?: Yes What is Your Religious Affiliation?: Christian How Might This Affect Treatment?: NA  Leisure/Recreation: Leisure / Recreation Do You Have Hobbies?: No  Exercise/Diet: Exercise/Diet Do You Exercise?: No Have You Gained or Lost A Significant Amount of Weight in the Past Six Months?: No Do You Follow a Special Diet?: No Do You Have Any Trouble Sleeping?: Yes Explanation of Sleeping Difficulties: Pt reports getting five hours of sleep.   CCA Employment/Education Employment/Work Situation: Employment / Work Situation Employment Situation: On disability Why is Patient on Disability: Mental health. How Long has Patient Been on Disability: Unsure. Patient's Job has Been Impacted by Current Illness: No Has Patient ever Been in the [employer]?: No  Education: Education Is Patient Currently Attending School?: No Last Grade Completed: 11 Did You Attend College?: No Did You Have An Individualized Education Program (IIEP): No Did You Have Any Difficulty At School?: No Patient's Education Has Been Impacted by Current Illness: No   CCA Family/Childhood History Family and Relationship History: Family history Marital status: Single Does patient have children?: Yes How many children?: 3 How is patient's relationship with their children?: Pt reports, she has three children (11, 15, 3) whos been adopted.  Childhood History:  Childhood History By whom was/is the patient raised?: Mother Did patient suffer any verbal/emotional/physical/sexual abuse as a child?: No Did patient suffer from severe childhood neglect?: No Has patient ever been sexually abused/assaulted/raped as an adolescent or adult?: No Was the patient ever a victim of a crime or a disaster?:  No Witnessed domestic violence?: No Has patient been affected by domestic violence as an adult?: No  CCA Substance Use Alcohol/Drug Use: Alcohol / Drug Use Pain Medications: See MAR Prescriptions: See MAR Over the Counter: See MAR History of alcohol / drug use?: No history of alcohol / drug abuse Longest period of sobriety (when/how long): NA Negative Consequences of Use:  (NA) Withdrawal Symptoms: None    ASAM's:  Six Dimensions of Multidimensional Assessment  Dimension 1:  Acute Intoxication and/or Withdrawal Potential:      Dimension 2:  Biomedical Conditions and Complications:      Dimension 3:  Emotional, Behavioral, or Cognitive Conditions and Complications:     Dimension 4:  Readiness to Change:     Dimension 5:  Relapse, Continued use, or Continued Problem Potential:     Dimension 6:  Recovery/Living Environment:     ASAM Severity Score:    ASAM Recommended Level of Treatment:     Substance use Disorder (SUD)    Recommendations for Services/Supports/Treatments: Recommendations for Services/Supports/Treatments Recommendations  For Services/Supports/Treatments: Other (Comment) (Pt to be admitted to [facility] for Observation.)  Disposition Recommendation per psychiatric provider: Pt to be admitted to [facility] for Observation.    DSM5 Diagnoses: Patient Active Problem List   Diagnosis Date Noted   Suicide attempt by benzodiazepine overdose (HCC) 03/14/2024   Altered mental status 02/11/2024   Agitation 09/28/2022   Suicidal ideations 01/04/2022   Suicidal overdose (HCC) 03/23/2021   Syphilis 07/15/2020   Malingering 06/05/2020   Gastroesophageal reflux disease 05/04/2020   Hyperglycemia due to type 2 diabetes mellitus (HCC) 05/04/2020   Long term (current) use of insulin  (HCC) 05/04/2020   Migraine without aura 05/04/2020   Morbid obesity (HCC) 05/04/2020   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 2 diabetes mellitus (HCC) 05/04/2020   Prolapsed internal hemorrhoids  05/04/2020   Vitamin D  deficiency 05/04/2020   Other symptoms and signs involving cognitive functions and awareness 05/04/2020   Suicide attempt [facility])    Anxiety state 03/06/2020   Schizophrenia (HCC) 09/13/2019   Bipolar I disorder, most recent episode depressed (HCC) 06/23/2019   MDD (major depressive disorder) 10/10/2018   Schizoaffective disorder, bipolar type (HCC) 09/25/2018   Affective psychosis, bipolar (HCC) 06/13/2018   HTN (hypertension) 05/03/2018   Tobacco use disorder 05/03/2018   Adjustment disorder with emotional disturbance 01/02/2018   Schizophrenia, 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   Moderate bipolar I disorder, most recent episode depressed (HCC)    Schizoaffective disorder (HCC)    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/2017   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   OCD (obsessive compulsive disorder) 10/05/2016   Major depressive disorder, recurrent episode, mild 05/04/2016   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   Learning disability 07/18/2015   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/2015   Diabetes mellitus (HCC) 07/18/2015   MDD (major depressive disorder), recurrent, severe, 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   Hyperlipidemia 07/18/2015   Severe episode of recurrent major depressive disorder, without psychotic features (HCC)    Suicidal ideation    OD (overdose of drug), intentional self-harm, initial encounter (HCC)    Cognitive deficits 10/12/2012   Generalized anxiety disorder 06/28/2012     Referrals to Alternative Service(s): Referred to Alternative Service(s):   Place:   Date:   Time:    Referred to Alternative Service(s):   Place:   Date:   Time:    Referred to Alternative Service(s):   Place:   Date:   Time:    Referred to Alternative Service(s):   Place:   Date:   Time:     Jackson JONETTA Broach, [facility] Comprehensive Clinical Assessment (CCA) Screening, Triage and Referral Note  04/28/2024 [name] [id number]  Chief  Complaint:  Chief Complaint  Patient presents with   Suicidal   Visit Diagnosis:   Patient Reported Information How did you hear about us ? Self  What Is the Reason for Your Visit/Call Today? Patient is a 34 year old female that presents voluntary by [employer] reporting ongoing SI with a plan to overdose on her medications. Patient denies any HI or AVH. When asked what prompted thoughts of self harm patient stated, just life, patient also reports excessive anxiety stating her medications are not working. Patient is observed to be liable and cries followed by laughing but is pleasant stating, she needs help right now. Patient denies any current SA use and reports she is compliant with her medications. Patient states she is, thirsty and needs five cups of water . This clinical research associate provider her with two. Patient states she is intent on ending  her life stating, you know I will do it.  How Long Has This Been Causing You Problems? <Week  What Do You Feel Would Help You the Most Today? Treatment for Depression or other mood problem   Have You Recently Had Any Thoughts About Hurting Yourself? Yes  Are You Planning to Commit Suicide/Harm Yourself At This time? Yes   Have you Recently Had Thoughts About Hurting Someone Sherral? No  Are You Planning to Harm Someone at This Time? No  Explanation: NA   Have You Used Any Alcohol or Drugs in the Past 24 Hours? No  How Long Ago Did You Use Drugs or Alcohol? Pt denies.  What Did You Use and How Much? Pt denies.   Do You Currently Have a Therapist/Psychiatrist? Yes  Name of Therapist/Psychiatrist: [facility] ACT [facility].   Have You Been Recently Discharged From Any Office Practice or Programs? No  Explanation of Discharge From Practice/Program: NA   CCA Screening Triage Referral Assessment Type of Contact: Face-to-Face  Telemedicine Service Delivery:   Is this Initial or Reassessment?   Date Telepsych consult ordered in CHL:    Time  Telepsych consult ordered in CHL:    Location of Assessment: [facility] Assessment Services  Provider Location: [facility] Assessment Services    Collateral Involvement: NA   Does Patient Have a Automotive Engineer Guardian? No. Name and Contact of Legal Guardian: NA If Minor and Not Living with Parent(s), Who has Custody? NA  Is CPS involved or ever been involved? Never  Is APS involved or ever been involved? Never   Patient Determined To Be At Risk for Harm To Self or Others Based on Review of Patient Reported Information or Presenting Complaint? Yes, for Self-Harm  Method: Plan with intent and identified person  Availability of Means: Has close by  Intent: Clearly intends on inflicting harm that could cause death  Notification Required: No need or identified person  Additional Information for Danger to Others Potential: -- (NA)  Additional Comments for Danger to Others Potential: NA  Are There Guns or Other Weapons in Your Home? No  Types of Guns/Weapons: Pt denies.  Are These Weapons Safely Secured?                            -- (NA)  Who Could Verify You Are Able To Have These Secured: NA  Do You Have any Outstanding Charges, Pending Court Dates, Parole/Probation? Pt denies.  Contacted To Inform of Risk of Harm To Self or Others: Other: Comment (NA)   Does Patient Present under Involuntary Commitment? No    [place] of Residence: [place]   Patient Currently Receiving the Following Services: ACTT Psychologist, Educational); Medication Management   Determination of Need: Urgent (48 hours)   Options For Referral: Inpatient Hospitalization; [facility]; Medication Management; Outpatient Therapy (To be determined)   Disposition Recommendation per psychiatric provider: Pt to be admitted to [facility] for Observation.   Jackson JONETTA Broach, LCMHC     Xareni Kelch D Tirza Senteno, MS, [facility], [facility] Triage Specialist [phone]

## 2024-04-28 NOTE — ED Notes (Signed)
 Pt A&O x 4, presents with suicidal ideations, plan to overdose on home meds.  Pt reports major stressor is life itself..  Denies HI or AVH, monitoring for safety.

## 2024-04-28 NOTE — Progress Notes (Signed)
" °   04/28/24 1835  [facility] Triage Screening (Walk-ins at [facility] only)  How Did You Hear About Us ? Self  What Is the Reason for Your Visit/Call Today? Patient is a 34 year old female that presents voluntary by [employer] reporting ongoing SI with a plan to overdose on her medications. Patient denies any HI or AVH. When asked what prompted thoughts of self harm patient stated, just life, patient also reports excessive anxiety stating her medications are not working. Patient is observed to be liable and cries followed by laughing but is pleasant stating, she needs help right now. Patient denies any current SA use and reports she is compliant with her medications. Patient states she is, thirsty and needs five cups of water . This clinical research associate provider her with two. Patient states she is intent on ending her life stating, you know I will do it.  How Long Has This Been Causing You Problems? <Week  Have You Recently Had Any Thoughts About Hurting Yourself? Yes  How long ago did you have thoughts about hurting yourself? Patient reports current SI with a plan to overdose on her medications  Are You Planning to Commit Suicide/Harm Yourself At This time? Yes  Have you Recently Had Thoughts About Hurting Someone Sherral? No  Are You Planning To Harm Someone At This Time? No  Physical Abuse Denies  Verbal Abuse Denies  Sexual Abuse Denies  Exploitation of patient/patient's resources Denies  Self-Neglect Denies  Possible abuse reported to: Other (Comment) (NA)  Are you currently experiencing any auditory, visual or other hallucinations? No  Have You Used Any Alcohol or Drugs in the Past 24 Hours? No  Do you have any current medical co-morbidities that require immediate attention? No  Please describe current medical co-morbidities that require immediate attention: NA  Clinician description of patient physical appearance/behavior: Patient is observed to be liable  What Do You Feel Would Help You the Most Today? Treatment for  Depression or other mood problem  If access to [facility] was not available, would you have sought care in the Emergency Department? No  Determination of Need Urgent (48 hours)  Options For Referral  (To be determined)  Determination of Need filed? Yes    "

## 2024-04-28 NOTE — ED Notes (Signed)
Pt sleeping at present, no distress noted,  Monitoring for safety. 

## 2024-04-29 NOTE — Care Management (Signed)
 OBS Care Management    Writer contacted the [facility] crisis line [phone])  and informed the case manager that the patient will be discharging back to her home today.  Per the case manager Nanetta the patient lives independently and they will be following up with the patient on Monday.

## 2024-04-29 NOTE — Discharge Instructions (Addendum)
 Patient is instructed prior to discharge to: Take all medications as prescribed by his/her mental healthcare provider. Report any adverse effects and or reactions from the medicines to his/her outpatient provider promptly. Keep all scheduled appointments, to ensure that you are getting refills on time and to avoid any interruption in your medication.  If you are unable to keep an appointment call to reschedule.  Be sure to follow-up with resources and follow-up appointments provided.  Patient has been instructed & cautioned: To not engage in alcohol and or illegal drug use while on prescription medicines. In the event of worsening symptoms, patient is instructed to call the crisis hotline, 911 and or go to the nearest ED for appropriate evaluation and treatment of symptoms. To follow-up with his/her primary care provider for your other medical issues, concerns and or health care needs.  Information: -National Suicide Prevention Lifeline 1-800-SUICIDE or (534)352-0397.  -988 offers 24/7 access to trained crisis counselors who can help people experiencing mental health-related distress. People can call or text 988 or chat 988lifeline.org for themselves or if they are worried about a loved one who may need crisis support.

## 2024-04-29 NOTE — ED Notes (Signed)
 Pt sleeping at present, no distress noted.  Monitoring for safety.

## 2024-04-29 NOTE — ED Provider Notes (Signed)
 FBC/OBS ASAP Discharge Summary  Date and Time: 04/29/2024 3:05 PM  Name: [name]  MRN:  [id number]   Discharge Diagnoses:  Final diagnoses:  MDD (major depressive disorder), recurrent episode, moderate (HCC)    Subjective: Patient states I am feeling a lot better, I am ready to go home now.  Patient is insightful today.  Reports she drank 1 alcoholic beverage on yesterday this affected her mood negatively.  Patient reports she will not use alcohol moving forward.  [name] is a 34 year old female with history of bipolar 1 disorder, who presented voluntarily to [facility] behavioral health on 04/28/2024.  Upon arrival patient endorsed suicidal ideation with a plan to overdose on medications.  Patient denies SI/HI/AVH today.  Patient states I am not feeling that way now. Chart reviewed and patient discussed with attending psychiatrist, Dr. Cole, on 04/29/2024.  [name] discussed by this nurse practitioner face-to-face.  She was seated, no apparent distress.  She is alert and oriented, pleasant and cooperative during assessment.  She presents with euthymic mood, congruent affect.  [name] is followed by outpatient psychiatry, [facility] ACT team.  She meets with ACT team services provider, Garrel, several times per week.  She is compliant with medications, including LAI.  Patient offered support and encouragement.  She gives verbal consent to speak with [facility].  [facility] team aware of current plan including discharge home today, no safety concerns.  Confirm patient compliant with medications.  Received updated medication list from [facility], current medications:  -Amlodipine  5 mg daily -Clonidine  0.1 mg nightly -Fluvoxamine  50 mg nightly -Gabapentin  300 mg 3 times daily -Invega  6 mg ER daily -Invega  Sustenna 234 mg IM once per month-most recent administration approximately 2 weeks ago. -Olanzapine  30  mg nightly  [name] confirms she is compliant with her medications.  She is a limited historian.  Reports ACT team assists with medication needs and scheduling of appointments.    Patient educated and verbalizes understanding of mental health resources and other crisis services in the community. They are instructed to call 911 and present to the nearest emergency room should patient experience any suicidal/homicidal ideation, auditory/visual/hallucinations, or detrimental worsening of mental health condition.      Stay Summary: 04/29/2024 0509am, per admitting provider assessment: Patient was seen face to face and her chart was reviewd. On assessment, patient reports feeling depressed due to feeling rejected by her mother she says her mother is mean and wiil not allow her to visit her home while her sister and other relatives are permitted to come to mother's home. She endorses increased anger, irritability, frequent crying spells, social isolation, and feelings of loneliness. Patient reports experiencing a panic attack earlier today, which escalated into suicidal ideation with a plan to overdose on her medications. She states she is currently unable to maintain her safety. She reports medication compliance and is followed by the [facility] ACT team. She says her last long-acting injectable was administered approximately two weeks ago.   Patient is alert and oriented x4. She is cooperative but tearful during the interview. Speech is normal in rate, tone, and volume. Mood is depressed with a congruent affect. Thought processes are linear. Thought content is notable for active suicidal ideation with plan to overdose; she denies homicidal ideation. She denies auditory or visual hallucinations at the time of assessment. Insight is limited, and judgment is impaired.  Due to active suicidal ideation with plan and inability to maintain safety, patient will be  admitted to observation unit for  safety and stabilization.  Patient will be reassessed by psychiatric tomorrow for suicidal risk, mood, and anxiety . Further disposition pending reassessment for safety and mood stability.   Total Time spent with patient: 30 minutes  Past Psychiatric History: Bipolar 1 disorder, MDD, suicidal ideation, GAD, ICD, schizophrenia, disorganized, schizoaffective disorder, bipolar type Past Medical History: Hyperlipidemia, hypertension, GERD, migraine without aura Family History: None reported Family Psychiatric History: None reported Social History: Resides alone in [place], denies access to weapons,  receives disability income, etoh use twice per month- 1-2 drinks Tobacco Cessation:  N/A, patient does not currently use tobacco products  Current Medications:  Current Facility-Administered Medications  Medication Dose Route Frequency Provider Last Rate Last Admin   acetaminophen  (TYLENOL ) tablet 650 mg  650 mg Oral Q6H PRN Ajibola, Ene A, NP       alum & mag hydroxide-simeth (MAALOX/MYLANTA) 200-200-20 MG/5ML suspension 30 mL  30 mL Oral Q4H PRN Ajibola, Ene A, NP       diphenhydrAMINE  (BENADRYL ) capsule 25 mg  25 mg Oral Q6H PRN Ajibola, Ene A, NP       haloperidol  (HALDOL ) tablet 5 mg  5 mg Oral TID PRN Ajibola, Ene A, NP       And   diphenhydrAMINE  (BENADRYL ) capsule 50 mg  50 mg Oral TID PRN Ajibola, Ene A, NP       haloperidol  lactate (HALDOL ) injection 5 mg  5 mg Intramuscular TID PRN Ajibola, Ene A, NP       And   diphenhydrAMINE  (BENADRYL ) injection 50 mg  50 mg Intramuscular TID PRN Ajibola, Ene A, NP       And   LORazepam  (ATIVAN ) injection 2 mg  2 mg Intramuscular TID PRN Ajibola, Ene A, NP       haloperidol  lactate (HALDOL ) injection 10 mg  10 mg Intramuscular TID PRN Ajibola, Ene A, NP       And   diphenhydrAMINE  (BENADRYL ) injection 50 mg  50 mg Intramuscular TID PRN Ajibola, Ene A, NP       And   LORazepam  (ATIVAN ) injection 2 mg  2 mg Intramuscular TID PRN Ajibola, Ene A, NP        magnesium  hydroxide (MILK OF MAGNESIA) suspension 30 mL  30 mL Oral Daily PRN Ajibola, Ene A, NP       traZODone  (DESYREL ) tablet 50 mg  50 mg Oral QHS PRN Ajibola, Ene A, NP       Current Outpatient Medications  Medication Sig Dispense Refill   amLODipine  (NORVASC ) 5 MG tablet Take 5 mg by mouth daily.     gabapentin  (NEURONTIN ) 300 MG capsule Take 1 capsule (300 mg total) by mouth 3 (three) times daily. 90 capsule 0   glipiZIDE  (GLUCOTROL  XL) 10 MG 24 hr tablet Take 10 mg by mouth daily with breakfast.     INVEGA  SUSTENNA 156 MG/ML SUSY injection      LORazepam  (ATIVAN ) 0.5 MG tablet Take 0.5 mg by mouth 2 (two) times daily as needed for anxiety.     paliperidone  (INVEGA ) 6 MG 24 hr tablet Take 6 mg by mouth daily.     traZODone  (DESYREL ) 150 MG tablet Take 1 tablet (150 mg total) by mouth at bedtime. 14 tablet 0   cloNIDine  (CATAPRES ) 0.2 MG tablet Take 0.5 tablets (0.1 mg total) by mouth at bedtime. (Patient not taking: Reported on 04/29/2024) 15 tablet 0   fluvoxaMINE  (LUVOX ) 50 MG tablet Take 1 tablet (50  mg total) by mouth at bedtime. (Patient not taking: Reported on 04/29/2024) 30 tablet 0   insulin  aspart protamine- aspart (NOVOLOG  MIX 70/30) (70-30) 100 UNIT/ML injection Inject 40 Units into the skin every evening.     OLANZapine  (ZYPREXA ) 10 MG tablet Take 3 tablets (30 mg total) by mouth at bedtime. (Patient not taking: Reported on 03/14/2024) 90 tablet 0    PTA Medications:  PTA Medications  Medication Sig   traZODone  (DESYREL ) 150 MG tablet Take 1 tablet (150 mg total) by mouth at bedtime.   INVEGA  SUSTENNA 156 MG/ML SUSY injection    gabapentin  (NEURONTIN ) 300 MG capsule Take 1 capsule (300 mg total) by mouth 3 (three) times daily.   amLODipine  (NORVASC ) 5 MG tablet Take 5 mg by mouth daily.   paliperidone  (INVEGA ) 6 MG 24 hr tablet Take 6 mg by mouth daily.   glipiZIDE  (GLUCOTROL  XL) 10 MG 24 hr tablet Take 10 mg by mouth daily with breakfast.   LORazepam  (ATIVAN ) 0.5 MG  tablet Take 0.5 mg by mouth 2 (two) times daily as needed for anxiety.   insulin  aspart protamine- aspart (NOVOLOG  MIX 70/30) (70-30) 100 UNIT/ML injection Inject 40 Units into the skin every evening.   cloNIDine  (CATAPRES ) 0.2 MG tablet Take 0.5 tablets (0.1 mg total) by mouth at bedtime. (Patient not taking: Reported on 04/29/2024)   fluvoxaMINE  (LUVOX ) 50 MG tablet Take 1 tablet (50 mg total) by mouth at bedtime. (Patient not taking: Reported on 04/29/2024)   OLANZapine  (ZYPREXA ) 10 MG tablet Take 3 tablets (30 mg total) by mouth at bedtime. (Patient not taking: Reported on 03/14/2024)   Facility Ordered Medications  Medication   acetaminophen  (TYLENOL ) tablet 650 mg   alum & mag hydroxide-simeth (MAALOX/MYLANTA) 200-200-20 MG/5ML suspension 30 mL   magnesium  hydroxide (MILK OF MAGNESIA) suspension 30 mL   haloperidol  (HALDOL ) tablet 5 mg   And   diphenhydrAMINE  (BENADRYL ) capsule 50 mg   haloperidol  lactate (HALDOL ) injection 5 mg   And   diphenhydrAMINE  (BENADRYL ) injection 50 mg   And   LORazepam  (ATIVAN ) injection 2 mg   haloperidol  lactate (HALDOL ) injection 10 mg   And   diphenhydrAMINE  (BENADRYL ) injection 50 mg   And   LORazepam  (ATIVAN ) injection 2 mg   traZODone  (DESYREL ) tablet 50 mg   diphenhydrAMINE  (BENADRYL ) capsule 25 mg       07/03/2022    1:22 AM 04/09/2022    8:58 PM 01/10/2022    6:13 PM  Depression screen PHQ 2/9  Decreased Interest 1 1 2   Down, Depressed, Hopeless 1 1 2   PHQ - 2 Score 2 2 4   Altered sleeping 2 2 2   Tired, decreased energy 2 2 1   Change in appetite 1 1 2   Feeling bad or failure about yourself  2 2 2   Trouble concentrating 1 1 2   Moving slowly or fidgety/restless 2 1 2   Suicidal thoughts 1 1 2   PHQ-9 Score 13  12  17    Difficult doing work/chores Somewhat difficult Somewhat difficult Very difficult     Data saved with a previous flowsheet row definition    Flowsheet Row ED from 04/28/2024 in [facility]  from 04/05/2024 in [facility] Emergency Department at [facility] ED from 04/03/2024 in [facility] Emergency Department at [facility]  C-SSRS RISK CATEGORY High Risk No Risk No Risk    Musculoskeletal  Strength & Muscle Tone: within normal limits Gait & Station: normal Patient leans: N/A  Psychiatric Specialty Exam  Presentation  General Appearance:  Appropriate for Environment  Eye Contact: Good  Speech: Clear and Coherent; Normal Rate  Speech Volume: Normal  Handedness: Right   Mood and Affect  Mood: Euthymic  Affect: Appropriate; Congruent   Thought Process  Thought Processes: Coherent; Goal Directed; Linear  Descriptions of Associations:Intact  Orientation:Full (Time, Place and Person)  Thought Content:Logical; WDL  Diagnosis of Schizophrenia or Schizoaffective disorder in past: No    Hallucinations:Hallucinations: None  Ideas of Reference:None  Suicidal Thoughts:Suicidal Thoughts: No  Homicidal Thoughts:Homicidal Thoughts: No   Sensorium  Memory: Immediate Good; Recent Fair  Judgment: Fair  Insight: Present   Executive Functions  Concentration: Fair  Attention Span: Fair  Recall: [employer] of Knowledge: Fair  Language: Fair   Psychomotor Activity  Psychomotor Activity: Psychomotor Activity: Normal   Assets  Assets: Communication Skills; Desire for Improvement; Financial Resources/Insurance; Housing; Physical Health; Social Support   Sleep  Sleep: Sleep: Good  No Safety Checks orders active in given range  Nutritional Assessment (For OBS and [facility] admissions only) Has the patient had a weight loss or gain of 10 pounds or more in the last 3 months?: No Has the patient had a decrease in food intake/or appetite?: No Does the patient have dental problems?: No Does the patient have eating habits or behaviors that may be indicators of an eating disorder including binging or inducing vomiting?:  No Has the patient recently lost weight without trying?: 0 Has the patient been eating poorly because of a decreased appetite?: 0 Malnutrition Screening Tool Score: 0    Physical Exam  Physical Exam Vitals and nursing note reviewed.  Constitutional:      Appearance: Normal appearance. She is well-developed.  HENT:     Head: Normocephalic and atraumatic.     Nose: Nose normal.  Cardiovascular:     Rate and Rhythm: Normal rate.  Pulmonary:     Effort: Pulmonary effort is normal.  Musculoskeletal:        General: Normal range of motion.     Cervical back: Normal range of motion.  Skin:    General: Skin is warm and dry.  Neurological:     Mental Status: She is alert and oriented to person, place, and time.  Psychiatric:        Attention and Perception: Attention and perception normal.        Mood and Affect: Mood and affect normal.        Speech: Speech normal.        Behavior: Behavior normal. Behavior is cooperative.        Thought Content: Thought content normal.        Cognition and Memory: Cognition and memory normal.    Review of Systems  Constitutional: Negative.   HENT: Negative.    Eyes: Negative.   Respiratory: Negative.    Cardiovascular: Negative.   Gastrointestinal: Negative.   Genitourinary: Negative.   Musculoskeletal: Negative.   Skin: Negative.   Neurological: Negative.   Psychiatric/Behavioral: Negative.     Blood pressure (!) 144/94, pulse 90, temperature 97.8 F (36.6 C), temperature source Oral, resp. rate 18, SpO2 100%. There is no height or weight on file to calculate BMI.  Demographic Factors:  Low socioeconomic status  Loss Factors: NA  Historical Factors: Prior suicide attempts and Impulsivity  Risk Reduction Factors:   Positive social support, Positive therapeutic relationship, and Positive coping skills or problem solving skills  Continued Clinical Symptoms:  Previous Psychiatric  Diagnoses and Treatments  Cognitive Features  That Contribute To Risk:  None    Suicide Risk:  Minimal: No identifiable suicidal ideation.  Patients presenting with no risk factors but with morbid ruminations; may be classified as minimal risk based on the severity of the depressive symptoms  Plan Of Care/Follow-up recommendations:  Follow up with primary care provider. Continue to follow with outpatient psychiatry, [facility] ACT team. Continue current medications.   Disposition: Discharge  Stacy LITTIE Dawn, FNP 04/29/2024, 3:05 PM

## 2024-04-29 NOTE — ED Provider Notes (Signed)
 [facility] Continuous Assessment Admission H&P  Date: 04/29/2024 Patient Name: [name] MRN: [id number] Chief Complaint: suicidal ideation  Diagnoses:  Final diagnoses:  MDD (major depressive disorder), recurrent episode, moderate (HCC)   The patient is a 34 year old female with a history of anxiety, schizoaffective disorder-bipolar type, depression, cognitive deficits, substance abuse, and prior suicide attempt who presented voluntarily with worsening anxiety and depressive symptoms.   Patient was seen face to face and her chart was reviewd. On assessment, patient reports feeling depressed due to feeling rejected by her mother she says her mother is mean and wiil not allow her to visit her home while her sister and other relatives are permitted to come to mother's home. She endorses increased anger, irritability, frequent crying spells, social isolation, and feelings of loneliness. Patient reports experiencing a panic attack earlier today, which escalated into suicidal ideation with a plan to overdose on her medications. She states she is currently unable to maintain her safety. She reports medication compliance and is followed by the [facility] ACT team. She says her last long-acting injectable was administered approximately two weeks ago.  Patient is alert and oriented x4. She is cooperative but tearful during the interview. Speech is normal in rate, tone, and volume. Mood is depressed with a congruent affect. Thought processes are linear. Thought content is notable for active suicidal ideation with plan to overdose; she denies homicidal ideation. She denies auditory or visual hallucinations at the time of assessment. Insight is limited, and judgment is impaired.  Due to active suicidal ideation with plan and inability to maintain safety, patient will be admitted to observation unit for safety and stabilization.  Patient will be reassessed by psychiatric tomorrow for  suicidal risk, mood, and anxiety . Further disposition pending reassessment for safety and mood stability.   HPI: [name]   Total Time spent with patient: 30 minutes  Musculoskeletal  Strength & Muscle Tone: within normal limits Gait & Station: normal Patient leans: Right  Psychiatric Specialty Exam  Presentation General Appearance:  Appropriate for Environment  Eye Contact: Good  Speech: Clear and Coherent  Speech Volume: Normal  Handedness: Right   Mood and Affect  Mood: Anxious; Depressed  Affect: Congruent   Thought Process  Thought Processes: Coherent  Descriptions of Associations:Intact  Orientation:Full (Time, Place and Person)  Thought Content:Logical  Diagnosis of Schizophrenia or Schizoaffective disorder in past: No   Hallucinations:Hallucinations: None  Ideas of Reference:None  Suicidal Thoughts:Suicidal Thoughts: No  Homicidal Thoughts:Homicidal Thoughts: No   Sensorium  Memory: Immediate Good; Recent Fair; Remote Fair  Judgment: Impaired  Insight: Fair   Chartered Certified Accountant: Fair  Attention Span: Fair  Recall: [employer] of Knowledge: Fair  Language: Fair   Psychomotor Activity  Psychomotor Activity: Psychomotor Activity: Normal   Assets  Assets: Communication Skills; Desire for Improvement; Housing; Physical Health   Sleep  Sleep: Sleep: Fair Number of Hours of Sleep: 5   Nutritional Assessment (For OBS and FBC admissions only) Has the patient had a weight loss or gain of 10 pounds or more in the last 3 months?: No Has the patient had a decrease in food intake/or appetite?: No Does the patient have dental problems?: No Does the patient have eating habits or behaviors that may be indicators of an eating disorder including binging or inducing vomiting?: No Has the patient recently lost weight without trying?: 0 Has the patient been eating poorly because of a decreased  appetite?: 0 Malnutrition Screening  Tool Score: 0    Physical Exam Vitals and nursing note reviewed.  Constitutional:      General: She is not in acute distress.    Appearance: Normal appearance. She is well-developed. She is not ill-appearing or diaphoretic.  HENT:     Head: Normocephalic and atraumatic.  Cardiovascular:     Rate and Rhythm: Normal rate.  Pulmonary:     Effort: Pulmonary effort is normal.  Musculoskeletal:        General: Normal range of motion.     Cervical back: Normal range of motion.  Skin:    Capillary Refill: Capillary refill takes less than 2 seconds.  Neurological:     Mental Status: She is alert and oriented to person, place, and time.  Psychiatric:        Attention and Perception: Attention and perception normal.        Mood and Affect: Mood is anxious and depressed.        Speech: Speech normal.        Behavior: Behavior normal. Behavior is cooperative.        Thought Content: Thought content includes suicidal ideation. Thought content includes suicidal plan.        Cognition and Memory: Cognition normal.    Review of Systems  Constitutional: Negative.   HENT: Negative.    Eyes: Negative.   Respiratory: Negative.    Cardiovascular: Negative.   Gastrointestinal: Negative.   Genitourinary: Negative.   Musculoskeletal: Negative.   Skin: Negative.   Neurological: Negative.   Endo/Heme/Allergies: Negative.   Psychiatric/Behavioral:  Positive for depression and suicidal ideas. The patient is nervous/anxious.     Blood pressure (!) 151/89, pulse 98, temperature 98 F (36.7 C), temperature source Oral, resp. rate 20, SpO2 99%. There is no height or weight on file to calculate BMI.  Past Psychiatric History: anxiety, schizoaffective disorder-bipolar type, depression, cognitive deficits, substance abuse, and prior suicide attempt   Is the patient at risk to self? Yes  Has the patient been a risk to self in the past 6 months? Yes .    Has the  patient been a risk to self within the distant past? Yes   Is the patient a risk to others? No   Has the patient been a risk to others in the past 6 months? No   Has the patient been a risk to others within the distant past? No   Past Medical History: Past Medical History:  Diagnosis Date   Anxiety    Arthritis    Bipolar 1 disorder (HCC)    Cognitive deficits    Depression    Diabetes mellitus without complication (HCC)    Hypertension    Mental disorder    Mental health disorder    Obesity      Family History:  Family History  Problem Relation Age of Onset   Hypertension Mother    Diabetes Father      Social History:  Social History   Socioeconomic History   Marital status: Single    Spouse name: Not on file   Number of children: Not on file   Years of education: Not on file   Highest education level: Not on file  Occupational History   Not on file  Tobacco Use   Smoking status: Every Day    Current packs/day: 3.00    Types: Cigars, Cigarettes   Smokeless tobacco: Never   Tobacco comments:    Pt declined  Vaping Use  Vaping status: Never Used  Substance and Sexual Activity   Alcohol use: Yes   Drug use: Yes    Types: Crack cocaine, Other-see comments, Marijuana   Sexual activity: Yes    Birth control/protection: None    Comment: occasionally  Other Topics Concern   Not on file  Social History Narrative   ** Merged History Encounter **       Social Drivers of Health   Tobacco Use: High Risk (04/05/2024)   Patient History    Smoking Tobacco Use: Every Day    Smokeless Tobacco Use: Never    Passive Exposure: Not on file  Financial Resource Strain: Not on file  Food Insecurity: Food Insecurity Present (06/22/2023)   Hunger Vital Sign    Worried About Running Out of Food in the Last Year: Sometimes true    Ran Out of Food in the Last Year: Sometimes true  Transportation Needs: No Transportation Needs (06/22/2023)   PRAPARE - Therapist, Art (Medical): No    Lack of Transportation (Non-Medical): No  Physical Activity: Not on file  Stress: Not on file  Social Connections: Not on file  Intimate Partner Violence: Not At Risk (06/22/2023)   Humiliation, Afraid, Rape, and Kick questionnaire    Fear of Current or Ex-Partner: No    Emotionally Abused: No    Physically Abused: No    Sexually Abused: No  Depression (PHQ2-9): High Risk (07/03/2022)   Depression (PHQ2-9)    PHQ-2 Score: 13  Alcohol Screen: Low Risk (04/10/2022)   Alcohol Screen    Last Alcohol Screening Score (AUDIT): 0  Housing: Low Risk (04/10/2022)   Housing    Last Housing Risk Score: 0  Utilities: Not At Risk (06/22/2023)   AHC Utilities    Threatened with loss of utilities: No  Health Literacy: Not on file      Last Labs:  Admission on 04/28/2024  Component Date Value Ref Range Status   WBC 04/28/2024 9.6  4.0 - 10.5 K/uL Final   RBC 04/28/2024 5.09  3.87 - 5.11 MIL/uL Final   Hemoglobin 04/28/2024 13.3  12.0 - 15.0 g/dL Final   HCT 98/97/7973 40.3  36.0 - 46.0 % Final   MCV 04/28/2024 79.2 (L)  80.0 - 100.0 fL Final   MCH 04/28/2024 26.1  26.0 - 34.0 pg Final   MCHC 04/28/2024 33.0  30.0 - 36.0 g/dL Final   RDW 98/97/7973 14.9  11.5 - 15.5 % Final   Platelets 04/28/2024 207  150 - 400 K/uL Final   REPEATED TO VERIFY   nRBC 04/28/2024 0.0  0.0 - 0.2 % Final   Neutrophils Relative % 04/28/2024 48  % Final   Neutro Abs 04/28/2024 4.5  1.7 - 7.7 K/uL Final   Lymphocytes Relative 04/28/2024 41  % Final   Lymphs Abs 04/28/2024 4.0  0.7 - 4.0 K/uL Final   Monocytes Relative 04/28/2024 7  % Final   Monocytes Absolute 04/28/2024 0.7  0.1 - 1.0 K/uL Final   Eosinophils Relative 04/28/2024 3  % Final   Eosinophils Absolute 04/28/2024 0.3  0.0 - 0.5 K/uL Final   Basophils Relative 04/28/2024 1  % Final   Basophils Absolute 04/28/2024 0.1  0.0 - 0.1 K/uL Final   Immature Granulocytes 04/28/2024 0  % Final   Abs Immature Granulocytes  04/28/2024 0.02  0.00 - 0.07 K/uL Final   Performed at [facility], [address], [place], [place] [address]  Sodium 04/28/2024 139  135 - 145 mmol/L Final   Potassium 04/28/2024 3.7  3.5 - 5.1 mmol/L Final   Chloride 04/28/2024 102  98 - 111 mmol/L Final   CO2 04/28/2024 26  22 - 32 mmol/L Final   Glucose, Bld 04/28/2024 81  70 - 99 mg/dL Final   Glucose reference range applies only to samples taken after fasting for at least 8 hours.   BUN 04/28/2024 11  6 - 20 mg/dL Final   Creatinine, Ser 04/28/2024 0.71  0.44 - 1.00 mg/dL Final   Calcium  04/28/2024 9.4  8.9 - 10.3 mg/dL Final   Total Protein 98/97/7973 7.2  6.5 - 8.1 g/dL Final   Albumin 98/97/7973 4.4  3.5 - 5.0 g/dL Final   AST 98/97/7973 31  15 - 41 U/L Final   ALT 04/28/2024 36  0 - 44 U/L Final   Alkaline Phosphatase 04/28/2024 59  38 - 126 U/L Final   Total Bilirubin 04/28/2024 0.4  0.0 - 1.2 mg/dL Final   GFR, Estimated 04/28/2024 >60  >60 mL/min Final   Comment: (NOTE) Calculated using the CKD-EPI Creatinine Equation (2021)    Anion gap 04/28/2024 12  5 - 15 Final   Performed at [facility], [address], [place], [place] [address]   Alcohol, Ethyl (B) 04/28/2024 <15  <15 mg/dL Final   Comment: (NOTE) For medical purposes only. Performed at [facility], [address], [place], [place] [address]    Preg Test, Ur 04/28/2024 Negative  Negative Final   POC Amphetamine UR 04/28/2024 None Detected  NONE DETECTED (Cut Off Level 1000 ng/mL) Final   POC Secobarbital (BAR) 04/28/2024 None Detected  NONE DETECTED (Cut Off Level 300 ng/mL) Final   POC Buprenorphine (BUP) 04/28/2024 None Detected  NONE DETECTED (Cut Off Level 10 ng/mL) Final   POC Oxazepam (BZO) 04/28/2024 None Detected  NONE DETECTED (Cut Off Level 300 ng/mL) Final   POC Cocaine UR 04/28/2024 None Detected  NONE DETECTED (Cut Off Level 300 ng/mL) Final   POC Methamphetamine UR 04/28/2024 None Detected  NONE DETECTED (Cut Off Level 1000  ng/mL) Final   POC Morphine  04/28/2024 None Detected  NONE DETECTED (Cut Off Level 300 ng/mL) Final   POC Methadone UR 04/28/2024 None Detected  NONE DETECTED (Cut Off Level 300 ng/mL) Final   POC Oxycodone  UR 04/28/2024 None Detected  NONE DETECTED (Cut Off Level 100 ng/mL) Final   POC Marijuana UR 04/28/2024 None Detected  NONE DETECTED (Cut Off Level 50 ng/mL) Final  Admission on 04/05/2024, Discharged on 04/05/2024  Component Date Value Ref Range Status   Sodium 04/05/2024 135  135 - 145 mmol/L Final   Potassium 04/05/2024 3.9  3.5 - 5.1 mmol/L Final   HEMOLYSIS AT THIS LEVEL MAY AFFECT RESULT   Chloride 04/05/2024 103  98 - 111 mmol/L Final   CO2 04/05/2024 24  22 - 32 mmol/L Final   Glucose, Bld 04/05/2024 292 (H)  70 - 99 mg/dL Final   Glucose reference range applies only to samples taken after fasting for at least 8 hours.   BUN 04/05/2024 16  6 - 20 mg/dL Final   Creatinine, Ser 04/05/2024 0.94  0.44 - 1.00 mg/dL Final   Calcium  04/05/2024 8.4 (L)  8.9 - 10.3 mg/dL Final   GFR, Estimated 04/05/2024 >60  >60 mL/min Final   Comment: (NOTE) Calculated using the CKD-EPI Creatinine Equation (2021)    Anion gap 04/05/2024 8  5 - 15 Final  Performed at [facility], [address], [place], [place] [address]   WBC 04/05/2024 8.6  4.0 - 10.5 K/uL Final   RBC 04/05/2024 4.97  3.87 - 5.11 MIL/uL Final   Hemoglobin 04/05/2024 12.7  12.0 - 15.0 g/dL Final   HCT 87/89/7974 39.3  36.0 - 46.0 % Final   MCV 04/05/2024 79.1 (L)  80.0 - 100.0 fL Final   MCH 04/05/2024 25.6 (L)  26.0 - 34.0 pg Final   MCHC 04/05/2024 32.3  30.0 - 36.0 g/dL Final   RDW 87/89/7974 15.2  11.5 - 15.5 % Final   Platelets 04/05/2024 227  150 - 400 K/uL Final   nRBC 04/05/2024 0.0  0.0 - 0.2 % Final   Performed at [facility], [address] [place], [place] [address]   Troponin I (High Sensitivity) 04/05/2024 6  <18 ng/L Final   Comment: (NOTE) Elevated high sensitivity troponin I (hsTnI) values  and significant  changes across serial measurements may suggest ACS but many other  chronic and acute conditions are known to elevate hsTnI results.  Refer to the Links section for chest pain algorithms and additional  guidance. Performed at [facility], [address], [place], [place] [address]    Preg, Serum 04/05/2024 NEGATIVE  NEGATIVE Final   Comment:        THE SENSITIVITY OF THIS METHODOLOGY IS >10 mIU/mL. Performed at [facility], [address], [place], [place] [address]   Admission on 04/03/2024, Discharged on 04/03/2024  Component Date Value Ref Range Status   Yeast Wet Prep HPF POC 04/03/2024 NONE SEEN  NONE SEEN Final   Trich, Wet Prep 04/03/2024 NONE SEEN  NONE SEEN Final   Clue Cells Wet Prep HPF POC 04/03/2024 NONE SEEN  NONE SEEN Final   WBC, Wet Prep HPF POC 04/03/2024 <10  <10 Final   Sperm 04/03/2024 NONE SEEN   Final   Performed at [facility], [address], [place], [place] [address]   Neisseria Gonorrhea 04/03/2024 Negative   Final   Chlamydia 04/03/2024 Negative   Final   Comment 04/03/2024 Normal Reference Ranger Chlamydia - Negative   Final   Comment 04/03/2024 Normal Reference Range Neisseria Gonorrhea - Negative   Final   Color, Urine 04/03/2024 STRAW (A)  YELLOW Final   APPearance 04/03/2024 CLEAR  CLEAR Final   Specific Gravity, Urine 04/03/2024 1.028  1.005 - 1.030 Final   pH 04/03/2024 5.0  5.0 - 8.0 Final   Glucose, UA 04/03/2024 >=500 (A)  NEGATIVE mg/dL Final   Hgb urine dipstick 04/03/2024 NEGATIVE  NEGATIVE Final   Bilirubin Urine 04/03/2024 NEGATIVE  NEGATIVE Final   Ketones, ur 04/03/2024 NEGATIVE  NEGATIVE mg/dL Final   Protein, ur 87/91/7974 NEGATIVE  NEGATIVE mg/dL Final   Nitrite 87/91/7974 NEGATIVE  NEGATIVE Final   Leukocytes,Ua 04/03/2024 NEGATIVE  NEGATIVE Final   RBC / HPF 04/03/2024 0-5  0 - 5 RBC/hpf Final   WBC, UA 04/03/2024 0-5  0 - 5 WBC/hpf Final   Bacteria, UA 04/03/2024 NONE SEEN   NONE SEEN Final   Squamous Epithelial / HPF 04/03/2024 0-5  0 - 5 /HPF Final   Mucus 04/03/2024 PRESENT   Final   Performed at [facility], [address] [place], [place] [address]   Preg Test, Ur 04/03/2024 NEGATIVE  NEGATIVE Final   Comment:        THE SENSITIVITY OF THIS METHODOLOGY IS >20 mIU/mL.  Performed at [facility], [address], [place], [place] [address]   Admission on 04/01/2024, Discharged on 04/01/2024  Component Date Value Ref Range Status   Sodium 04/01/2024 140  135 - 145 mmol/L Final   Potassium 04/01/2024 3.8  3.5 - 5.1 mmol/L Final   HEMOLYSIS AT THIS LEVEL MAY AFFECT RESULT   Chloride 04/01/2024 104  98 - 111 mmol/L Final   CO2 04/01/2024 25  22 - 32 mmol/L Final   Glucose, Bld 04/01/2024 241 (H)  70 - 99 mg/dL Final   Glucose reference range applies only to samples taken after fasting for at least 8 hours.   BUN 04/01/2024 9  6 - 20 mg/dL Final   Creatinine, Ser 04/01/2024 0.87  0.44 - 1.00 mg/dL Final   Calcium  04/01/2024 9.5  8.9 - 10.3 mg/dL Final   GFR, Estimated 04/01/2024 >60  >60 mL/min Final   Comment: (NOTE) Calculated using the CKD-EPI Creatinine Equation (2021)    Anion gap 04/01/2024 12  5 - 15 Final   Performed at [facility], [address] [place], [place] [address]   WBC 04/01/2024 8.9  4.0 - 10.5 K/uL Final   RBC 04/01/2024 4.99  3.87 - 5.11 MIL/uL Final   Hemoglobin 04/01/2024 12.8  12.0 - 15.0 g/dL Final   HCT 87/93/7974 39.3  36.0 - 46.0 % Final   MCV 04/01/2024 78.8 (L)  80.0 - 100.0 fL Final   MCH 04/01/2024 25.7 (L)  26.0 - 34.0 pg Final   MCHC 04/01/2024 32.6  30.0 - 36.0 g/dL Final   RDW 87/93/7974 15.3  11.5 - 15.5 % Final   Platelets 04/01/2024 236  150 - 400 K/uL Final   nRBC 04/01/2024 0.0  0.0 - 0.2 % Final   Performed at [facility], [address] [place], [place] [address]   Troponin T High Sensitivity 04/01/2024 <15  0 - 19 ng/L Final    Comment: (NOTE) Biotin concentrations > 1000 ng/mL falsely decrease TnT results.  Serial cardiac troponin measurements are suggested.  Refer to the Links section for chest pain algorithms and additional  guidance. Performed at [facility], [address] [place], [place] [address]    Preg, Serum 04/01/2024 NEGATIVE  NEGATIVE Final   Comment:        THE SENSITIVITY OF THIS METHODOLOGY IS >10 mIU/mL. Performed at [facility], [address], [place], [place] [address]   Admission on 03/13/2024, Discharged on 03/15/2024  Component Date Value Ref Range Status   Glucose-Capillary 03/13/2024 301 (H)  70 - 99 mg/dL Final   Glucose reference range applies only to samples taken after fasting for at least 8 hours.   Sodium 03/13/2024 139  135 - 145 mmol/L Final   Electrolytes repeated to verify    Potassium 03/13/2024 4.1  3.5 - 5.1 mmol/L Final   Chloride 03/13/2024 100  98 - 111 mmol/L Final   CO2 03/13/2024 25  22 - 32 mmol/L Final   Glucose, Bld 03/13/2024 320 (H)  70 - 99 mg/dL Final   Glucose reference range applies only to samples taken after fasting for at least 8 hours.   BUN 03/13/2024 9  6 - 20 mg/dL Final   Creatinine, Ser 03/13/2024 0.77  0.44 - 1.00 mg/dL Final   Calcium  03/13/2024 10.0  8.9 - 10.3 mg/dL Final   Total Protein 88/82/7974 7.3  6.5 - 8.1 g/dL Final   Albumin 88/82/7974 4.3  3.5 - 5.0 g/dL Final   AST 88/82/7974 30  15 - 41 U/L Final   ALT 03/13/2024 29  0 - 44 U/L Final   Alkaline Phosphatase 03/13/2024 71  38 - 126 U/L Final   Total Bilirubin 03/13/2024 0.4  0.0 - 1.2 mg/dL Final   GFR, Estimated 03/13/2024 >60  >60 mL/min Final   Comment: (NOTE) Calculated using the CKD-EPI Creatinine Equation (2021)    Anion gap 03/13/2024 14  5 - 15 Final   Performed at [facility], [address] [place] [place], [place] [address]   Salicylate Lvl 03/13/2024 <7.0 (L)  7.0 - 30.0 mg/dL Final   Performed at [facility], [address], [place], [place] [address]   Acetaminophen  (Tylenol ), Serum 03/13/2024 <10 (L)  10 - 30 ug/mL Final   Comment: (NOTE) Toxic concentrations can be more effectively related to post dose interval; >200, >100, and >50 ug/mL serum concentrations correspond to toxic concentrations at 4, 8, and 12 hours post dose, respectively.  Performed at [facility], [address], [place], [place] [address]    Alcohol, Ethyl (B) 03/13/2024 <15  <15 mg/dL Final   Comment: (NOTE) For medical purposes only. Performed at [facility], [address] [place], [place] [address]    Opiates 03/13/2024 NEGATIVE  NEGATIVE Final   Cocaine 03/13/2024 NEGATIVE  NEGATIVE Final   Benzodiazepines 03/13/2024 POSITIVE (A)  NEGATIVE Final   Amphetamines 03/13/2024 NEGATIVE  NEGATIVE Final   Tetrahydrocannabinol 03/13/2024 NEGATIVE  NEGATIVE Final   Barbiturates 03/13/2024 NEGATIVE  NEGATIVE Final   Methadone Scn, Ur 03/13/2024 NEGATIVE  NEGATIVE Final   Fentanyl  03/13/2024 NEGATIVE  NEGATIVE Final   Comment: (NOTE) Drug screen is for Medical Purposes only. Positive results are preliminary only. If confirmation is needed, notify lab within 5 days.  Drug Class                 Cutoff (ng/mL) Amphetamine and metabolites 1000 Barbiturate and metabolites 200 Benzodiazepine              200 Opiates and metabolites     300 Cocaine and metabolites     300 THC                         50 Fentanyl                     5 Methadone                   300  Trazodone  is metabolized in vivo to several metabolites,  including pharmacologically active m-CPP, which is excreted in the  urine.  Immunoassay screens for amphetamines and MDMA have potential  cross-reactivity with these compounds and may provide false positive  result.  Performed at [facility], [address] [place], [place] [address]    WBC 03/13/2024 10.7 (H)   4.0 - 10.5 K/uL Final   RBC 03/13/2024 5.28 (H)  3.87 - 5.11 MIL/uL Final   Hemoglobin 03/13/2024 13.4  12.0 - 15.0 g/dL Final   HCT 88/82/7974 41.3  36.0 - 46.0 % Final   MCV 03/13/2024 78.2 (L)  80.0 - 100.0 fL Final   MCH 03/13/2024 25.4 (L)  26.0 - 34.0 pg Final   MCHC 03/13/2024 32.4  30.0 - 36.0 g/dL Final   RDW 88/82/7974 15.4  11.5 - 15.5 % Final   Platelets 03/13/2024 234  150 - 400 K/uL Final   nRBC 03/13/2024 0.0  0.0 - 0.2 % Final   Neutrophils Relative % 03/13/2024 55  % Final   Neutro Abs 03/13/2024 5.9  1.7 - 7.7 K/uL Final   Lymphocytes Relative 03/13/2024 35  % Final   Lymphs Abs 03/13/2024 3.8  0.7 - 4.0 K/uL Final   Monocytes Relative 03/13/2024 6  % Final   Monocytes Absolute 03/13/2024 0.7  0.1 - 1.0 K/uL Final   Eosinophils Relative 03/13/2024 3  % Final   Eosinophils Absolute 03/13/2024 0.3  0.0 - 0.5 K/uL Final   Basophils Relative 03/13/2024 1  % Final   Basophils Absolute 03/13/2024 0.1  0.0 - 0.1 K/uL Final   Immature Granulocytes 03/13/2024 0  % Final   Abs Immature Granulocytes 03/13/2024 0.03  0.00 - 0.07 K/uL Final   Performed at [facility], [address] [place], [place] [address]   Preg, Serum 03/13/2024 NEGATIVE  NEGATIVE Final   Comment:        THE SENSITIVITY OF THIS METHODOLOGY IS >10 mIU/mL. Performed at [facility], [address], [place], [place] [address]    Lithium  Lvl 03/13/2024 <0.10 (L)  0.60 - 1.20 mmol/L Final   Performed at [facility], [address], [place], [place] [address]   Glucose-Capillary 03/14/2024 275 (H)  70 - 99 mg/dL Final   Glucose reference range applies only to samples taken after fasting for at least 8 hours.   Hgb A1c MFr Bld 03/13/2024 12.1 (H)  4.8 - 5.6 % Final   Comment: (NOTE) Diagnosis of Diabetes The following HbA1c ranges recommended by the American Diabetes Association (ADA) may be used as an aid in the diagnosis of diabetes  mellitus.  Hemoglobin             Suggested A1C NGSP%              Diagnosis  <5.7                   Non Diabetic  5.7-6.4                Pre-Diabetic  >6.4                   Diabetic  <7.0                   Glycemic control for                       adults with diabetes.     Mean Plasma Glucose 03/13/2024 300.57  mg/dL Final   Performed at [facility], [address], [place], [place] [address]   Glucose-Capillary 03/14/2024 282 (H)  70 - 99 mg/dL Final   Glucose reference range applies only to samples taken after fasting for at least 8 hours.   Glucose-Capillary 03/14/2024 338 (H)  70 - 99 mg/dL Final   Glucose reference range applies only to samples taken after fasting for at least 8 hours.   Glucose-Capillary 03/14/2024 280 (H)  70 - 99 mg/dL Final   Glucose reference range applies only to samples taken after fasting for at least 8 hours.   Glucose-Capillary 03/15/2024 258 (H)  70 - 99 mg/dL Final   Glucose reference range applies only to samples taken after fasting for at least 8 hours.   Glucose-Capillary 03/15/2024 304 (H)  70 - 99 mg/dL Final  Glucose reference range applies only to samples taken after fasting for at least 8 hours.   Glucose-Capillary 03/15/2024 240 (H)  70 - 99 mg/dL Final   Glucose reference range applies only to samples taken after fasting for at least 8 hours.   Comment 1 03/15/2024 Notify RN   Final   Comment 2 03/15/2024 Document in Chart   Final  Admission on 02/13/2024, Discharged on 02/13/2024  Component Date Value Ref Range Status   Glucose-Capillary 02/13/2024 309 (H)  70 - 99 mg/dL Final   Glucose reference range applies only to samples taken after fasting for at least 8 hours.   Yeast Wet Prep HPF POC 02/13/2024 NONE SEEN  NONE SEEN Final   Trich, Wet Prep 02/13/2024 NONE SEEN  NONE SEEN Final   Clue Cells Wet Prep HPF POC 02/13/2024 NONE SEEN  NONE SEEN Final   WBC, Wet Prep HPF POC 02/13/2024 <10  <10 Final   Sperm 02/13/2024 NONE  SEEN   Final   Performed at [facility], [address], [place], [place] [address]  Admission on 02/10/2024, Discharged on 02/11/2024  Component Date Value Ref Range Status   WBC 02/10/2024 9.9  4.0 - 10.5 K/uL Final   RBC 02/10/2024 5.22 (H)  3.87 - 5.11 MIL/uL Final   Hemoglobin 02/10/2024 12.8  12.0 - 15.0 g/dL Final   HCT 89/83/7974 41.2  36.0 - 46.0 % Final   MCV 02/10/2024 78.9 (L)  80.0 - 100.0 fL Final   MCH 02/10/2024 24.5 (L)  26.0 - 34.0 pg Final   MCHC 02/10/2024 31.1  30.0 - 36.0 g/dL Final   RDW 89/83/7974 14.7  11.5 - 15.5 % Final   Platelets 02/10/2024 227  150 - 400 K/uL Final   nRBC 02/10/2024 0.0  0.0 - 0.2 % Final   Neutrophils Relative % 02/10/2024 53  % Final   Neutro Abs 02/10/2024 5.3  1.7 - 7.7 K/uL Final   Lymphocytes Relative 02/10/2024 36  % Final   Lymphs Abs 02/10/2024 3.6  0.7 - 4.0 K/uL Final   Monocytes Relative 02/10/2024 6  % Final   Monocytes Absolute 02/10/2024 0.6  0.1 - 1.0 K/uL Final   Eosinophils Relative 02/10/2024 3  % Final   Eosinophils Absolute 02/10/2024 0.3  0.0 - 0.5 K/uL Final   Basophils Relative 02/10/2024 1  % Final   Basophils Absolute 02/10/2024 0.1  0.0 - 0.1 K/uL Final   Immature Granulocytes 02/10/2024 1  % Final   Abs Immature Granulocytes 02/10/2024 0.05  0.00 - 0.07 K/uL Final   Performed at [facility], [address] [place], [place] [address]   Salicylate Lvl 02/10/2024 <7.0 (L)  7.0 - 30.0 mg/dL Final   Performed at [facility], [address], [place], [place] [address]   Alcohol, Ethyl (B) 02/10/2024 <15  <15 mg/dL Final   Comment: (NOTE) For medical purposes only. Performed at [facility], [address] [place], [place] [address]    Acetaminophen  (Tylenol ), Serum 02/10/2024 <10 (L)  10 - 30 ug/mL Final   Comment: (NOTE) Toxic concentrations can be more effectively related to post dose interval; > 200, > 100, and > 50 ug/mL serum  concentrations correspond to toxic concentrations at 4, 8, and 12 hours post dose, respectively.  Performed at [facility], [address] [place], [place] [address]    Opiates 02/11/2024 NEGATIVE  NEGATIVE Final   Cocaine 02/11/2024 NEGATIVE  NEGATIVE  Final   Benzodiazepines 02/11/2024 NEGATIVE  NEGATIVE Final   Amphetamines 02/11/2024 NEGATIVE  NEGATIVE Final   Tetrahydrocannabinol 02/11/2024 NEGATIVE  NEGATIVE Final   Barbiturates 02/11/2024 NEGATIVE  NEGATIVE Final   Methadone Scn, Ur 02/11/2024 NEGATIVE  NEGATIVE Final   Fentanyl  02/11/2024 NEGATIVE  NEGATIVE Final   Comment: (NOTE) Drug screen is for Medical Purposes only. Positive results are preliminary only. If confirmation is needed, notify lab within 5 days.  Drug Class                 Cutoff (ng/mL) Amphetamine and metabolites 1000 Barbiturate and metabolites 200 Benzodiazepine              200 Opiates and metabolites     300 Cocaine and metabolites     300 THC                         50 Fentanyl                     5 Methadone                   300  Trazodone  is metabolized in vivo to several metabolites,  including pharmacologically active m-CPP, which is excreted in the  urine.  Immunoassay screens for amphetamines and MDMA have potential  cross-reactivity with these compounds and may provide false positive  result.  Performed at [facility], [address], [place], [place] [address]    Glucose-Capillary 02/10/2024 268 (H)  70 - 99 mg/dL Final   Glucose reference range applies only to samples taken after fasting for at least 8 hours.   Sodium 02/11/2024 137  135 - 145 mmol/L Final   Potassium 02/11/2024 3.9  3.5 - 5.1 mmol/L Final   Chloride 02/11/2024 101  98 - 111 mmol/L Final   BUN 02/11/2024 15  6 - 20 mg/dL Final   Creatinine, Ser 02/11/2024 0.80  0.44 - 1.00 mg/dL Final   Glucose, Bld 89/82/7974 360 (H)  70 - 99 mg/dL Final   Glucose reference range applies  only to samples taken after fasting for at least 8 hours.   Calcium , Ion 02/11/2024 1.17  1.15 - 1.40 mmol/L Final   TCO2 02/11/2024 26  22 - 32 mmol/L Final   Hemoglobin 02/11/2024 12.9  12.0 - 15.0 g/dL Final   HCT 89/82/7974 38.0  36.0 - 46.0 % Final  Admission on 02/04/2024, Discharged on 02/04/2024  Component Date Value Ref Range Status   Glucose-Capillary 02/04/2024 322 (H)  70 - 99 mg/dL Final   Glucose reference range applies only to samples taken after fasting for at least 8 hours.   Color, Urine 02/04/2024 YELLOW  YELLOW Final   APPearance 02/04/2024 CLEAR  CLEAR Final   Specific Gravity, Urine 02/04/2024 1.036 (H)  1.005 - 1.030 Final   pH 02/04/2024 5.0  5.0 - 8.0 Final   Glucose, UA 02/04/2024 >=500 (A)  NEGATIVE mg/dL Final   Hgb urine dipstick 02/04/2024 NEGATIVE  NEGATIVE Final   Bilirubin Urine 02/04/2024 NEGATIVE  NEGATIVE Final   Ketones, ur 02/04/2024 NEGATIVE  NEGATIVE mg/dL Final   Protein, ur 89/89/7974 NEGATIVE  NEGATIVE mg/dL Final   Nitrite 89/89/7974 NEGATIVE  NEGATIVE Final   Leukocytes,Ua 02/04/2024 NEGATIVE  NEGATIVE Final   RBC / HPF 02/04/2024 0-5  0 - 5 RBC/hpf Final   WBC, UA 02/04/2024 0-5  0 - 5 WBC/hpf Final   Bacteria, UA 02/04/2024  NONE SEEN  NONE SEEN Final   Squamous Epithelial / HPF 02/04/2024 0-5  0 - 5 /HPF Final   Mucus 02/04/2024 PRESENT   Final   Budding Yeast 02/04/2024 PRESENT   Final   Performed at [facility], [address], [place], [place] [address]   Specimen Description 02/04/2024    Final                   Value:URINE, CLEAN CATCH Performed at [facility], [address], [place], [place] [address]    Special Requests 02/04/2024    Final                   Value:NONE Performed at [facility] [address], [place], [place] [address]    Culture 02/04/2024  (A)   Final                   Value:10,000 COLONIES/mL GROUP B STREP(S.AGALACTIAE)ISOLATED TESTING AGAINST S.  AGALACTIAE NOT ROUTINELY PERFORMED DUE TO PREDICTABILITY OF AMP/PEN/VAN SUSCEPTIBILITY. Performed at [facility], [address], [place], [place] [address]    Report Status 02/04/2024 02/06/2024 FINAL   Final   Yeast Wet Prep HPF POC 02/04/2024 NONE SEEN  NONE SEEN Final   Trich, Wet Prep 02/04/2024 PRESENT (A)  NONE SEEN Final   Clue Cells Wet Prep HPF POC 02/04/2024 NONE SEEN  NONE SEEN Final   WBC, Wet Prep HPF POC 02/04/2024 <10  <10 Final   Sperm 02/04/2024 NONE SEEN   Final   Performed at [facility], [address], [place], [place] [address]   Neisseria Gonorrhea 02/04/2024 Negative   Final   Chlamydia 02/04/2024 Negative   Final   Comment 02/04/2024 Normal Reference Ranger Chlamydia - Negative   Final   Comment 02/04/2024 Normal Reference Range Neisseria Gonorrhea - Negative   Final  Admission on 01/25/2024, Discharged on 01/25/2024  Component Date Value Ref Range Status   Sodium 01/25/2024 137  135 - 145 mmol/L Final   Potassium 01/25/2024 3.9  3.5 - 5.1 mmol/L Final   HEMOLYSIS AT THIS LEVEL MAY AFFECT RESULT   Chloride 01/25/2024 104  98 - 111 mmol/L Final   CO2 01/25/2024 20 (L)  22 - 32 mmol/L Final   Glucose, Bld 01/25/2024 370 (H)  70 - 99 mg/dL Final   Glucose reference range applies only to samples taken after fasting for at least 8 hours.   BUN 01/25/2024 11  6 - 20 mg/dL Final   Creatinine, Ser 01/25/2024 0.82  0.44 - 1.00 mg/dL Final   Calcium  01/25/2024 9.3  8.9 - 10.3 mg/dL Final   GFR, Estimated 01/25/2024 >60  >60 mL/min Final   Comment: (NOTE) Calculated using the CKD-EPI Creatinine Equation (2021)    Anion gap 01/25/2024 14  5 - 15 Final   Performed at [facility], [address], [place], [place] [address]   Color, Urine 01/25/2024 STRAW (A)  YELLOW Final   APPearance 01/25/2024 CLEAR  CLEAR Final   Specific Gravity, Urine 01/25/2024 1.015  1.005 - 1.030 Final   pH 01/25/2024 5.0  5.0 - 8.0 Final    Glucose, UA 01/25/2024 >=500 (A)  NEGATIVE mg/dL Final   Hgb urine dipstick 01/25/2024 LARGE (A)  NEGATIVE Final   Bilirubin Urine 01/25/2024 NEGATIVE  NEGATIVE Final   Ketones, ur 01/25/2024 NEGATIVE  NEGATIVE mg/dL Final   Protein, ur 90/69/7974 NEGATIVE  NEGATIVE mg/dL  Final   Nitrite 01/25/2024 NEGATIVE  NEGATIVE Final   Leukocytes,Ua 01/25/2024 NEGATIVE  NEGATIVE Final   RBC / HPF 01/25/2024 21-50  0 - 5 RBC/hpf Final   WBC, UA 01/25/2024 0-5  0 - 5 WBC/hpf Final   Bacteria, UA 01/25/2024 NONE SEEN  NONE SEEN Final   Squamous Epithelial / HPF 01/25/2024 0-5  0 - 5 /HPF Final   Mucus 01/25/2024 PRESENT   Final   Performed at [facility], [address] [place], [place] [address]  Admission on 01/17/2024, Discharged on 01/18/2024  Component Date Value Ref Range Status   Glucose-Capillary 01/17/2024 455 (H)  70 - 99 mg/dL Final   Glucose reference range applies only to samples taken after fasting for at least 8 hours.   WBC 01/17/2024 8.7  4.0 - 10.5 K/uL Final   RBC 01/17/2024 5.18 (H)  3.87 - 5.11 MIL/uL Final   Hemoglobin 01/17/2024 12.6  12.0 - 15.0 g/dL Final   HCT 90/77/7974 41.1  36.0 - 46.0 % Final   MCV 01/17/2024 79.3 (L)  80.0 - 100.0 fL Final   MCH 01/17/2024 24.3 (L)  26.0 - 34.0 pg Final   MCHC 01/17/2024 30.7  30.0 - 36.0 g/dL Final   RDW 90/77/7974 14.5  11.5 - 15.5 % Final   Platelets 01/17/2024 220  150 - 400 K/uL Final   nRBC 01/17/2024 0.0  0.0 - 0.2 % Final   Performed at [facility], [address], [place], [place] [address]   Color, Urine 01/17/2024 STRAW (A)  YELLOW Final   APPearance 01/17/2024 CLEAR  CLEAR Final   Specific Gravity, Urine 01/17/2024 1.029  1.005 - 1.030 Final   pH 01/17/2024 5.0  5.0 - 8.0 Final   Glucose, UA 01/17/2024 >=500 (A)  NEGATIVE mg/dL Final   Hgb urine dipstick 01/17/2024 NEGATIVE  NEGATIVE Final   Bilirubin Urine 01/17/2024 NEGATIVE  NEGATIVE Final   Ketones, ur 01/17/2024 NEGATIVE   NEGATIVE mg/dL Final   Protein, ur 90/77/7974 NEGATIVE  NEGATIVE mg/dL Final   Nitrite 90/77/7974 NEGATIVE  NEGATIVE Final   Leukocytes,Ua 01/17/2024 TRACE (A)  NEGATIVE Final   RBC / HPF 01/17/2024 0-5  0 - 5 RBC/hpf Final   WBC, UA 01/17/2024 0-5  0 - 5 WBC/hpf Final   Bacteria, UA 01/17/2024 NONE SEEN  NONE SEEN Final   Squamous Epithelial / HPF 01/17/2024 0-5  0 - 5 /HPF Final   Mucus 01/17/2024 PRESENT   Final   Performed at [facility], [address], [place], [place] [address]   Glucose-Capillary 01/18/2024 358 (H)  70 - 99 mg/dL Final   Glucose reference range applies only to samples taken after fasting for at least 8 hours.   Preg, Serum 01/17/2024 NEGATIVE  NEGATIVE Final   Comment:        THE SENSITIVITY OF THIS METHODOLOGY IS >10 mIU/mL. Performed at [facility], [address] [place], [place] [address]    Sodium 01/17/2024 136  135 - 145 mmol/L Final   Potassium 01/17/2024 4.1  3.5 - 5.1 mmol/L Final   Chloride 01/17/2024 103  98 - 111 mmol/L Final   BUN 01/17/2024 10  6 - 20 mg/dL Final   Creatinine, Ser 01/17/2024 0.80  0.44 - 1.00 mg/dL Final   Glucose, Bld 90/77/7974 438 (H)  70 - 99 mg/dL Final   Glucose reference range applies only to samples taken after fasting for at least 8 hours.  Calcium , Ion 01/17/2024 1.16  1.15 - 1.40 mmol/L Final   TCO2 01/17/2024 24  22 - 32 mmol/L Final   Hemoglobin 01/17/2024 14.3  12.0 - 15.0 g/dL Final   HCT 90/77/7974 42.0  36.0 - 46.0 % Final   pH, Ven 01/18/2024 7.38  7.25 - 7.43 Final   pCO2, Ven 01/18/2024 47  44 - 60 mmHg Final   pO2, Ven 01/18/2024 45  32 - 45 mmHg Final   Bicarbonate 01/18/2024 27.8  20.0 - 28.0 mmol/L Final   Acid-Base Excess 01/18/2024 2.0  0.0 - 2.0 mmol/L Final   O2 Saturation 01/18/2024 83.2  % Final   Patient temperature 01/18/2024 37.0   Final   Performed at [facility], [address] [place], [place] [address]  There may be more  visits with results that are not included.    Allergies: Wellbutrin  [bupropion ], Buspar  [buspirone ], Contrast media [iodinated contrast media], Omnipaque  [iohexol ], Penicillins, Atarax  [hydroxyzine ], and Cymbalta  [duloxetine  hcl]  Medications:  Facility Ordered Medications  Medication   acetaminophen  (TYLENOL ) tablet 650 mg   alum & mag hydroxide-simeth (MAALOX/MYLANTA) 200-200-20 MG/5ML suspension 30 mL   magnesium  hydroxide (MILK OF MAGNESIA) suspension 30 mL   haloperidol  (HALDOL ) tablet 5 mg   And   diphenhydrAMINE  (BENADRYL ) capsule 50 mg   haloperidol  lactate (HALDOL ) injection 5 mg   And   diphenhydrAMINE  (BENADRYL ) injection 50 mg   And   LORazepam  (ATIVAN ) injection 2 mg   haloperidol  lactate (HALDOL ) injection 10 mg   And   diphenhydrAMINE  (BENADRYL ) injection 50 mg   And   LORazepam  (ATIVAN ) injection 2 mg   traZODone  (DESYREL ) tablet 50 mg   diphenhydrAMINE  (BENADRYL ) capsule 25 mg   PTA Medications  Medication Sig   GLOBAL EASE INJECT PEN NEEDLES 32G X 4 MM MISC Inject 1 each into the skin as directed.   amLODipine  (NORVASC ) 10 MG tablet Take 1 tablet (10 mg total) by mouth daily.   atorvastatin  (LIPITOR) 20 MG tablet Take 1 tablet (20 mg total) by mouth at bedtime. (Patient not taking: Reported on 03/14/2024)   lithium  carbonate (ESKALITH ) 450 MG ER tablet Take 1 tablet (450 mg total) by mouth at bedtime. For mood stabilization (Patient not taking: Reported on 03/14/2024)   traZODone  (DESYREL ) 150 MG tablet Take 1 tablet (150 mg total) by mouth at bedtime.   venlafaxine  XR (EFFEXOR -XR) 150 MG 24 hr capsule Take 1 capsule (150 mg total) by mouth daily with breakfast. (Patient not taking: Reported on 03/14/2024)   glimepiride  (AMARYL ) 4 MG tablet Take 1 tablet (4 mg total) by mouth daily. (Patient not taking: Reported on 03/14/2024)   benztropine  (COGENTIN ) 0.5 MG tablet Take 1 tablet (0.5 mg total) by mouth 2 (two) times daily. For eps (Patient not taking: Reported on  03/14/2024)   diclofenac  Sodium (VOLTAREN ) 1 % GEL Apply 4 g topically 4 (four) times daily. (Patient not taking: Reported on 11/17/2023)   ondansetron  (ZOFRAN -ODT) 4 MG disintegrating tablet 4mg  ODT q4 hours prn nausea/vomit (Patient not taking: Reported on 03/14/2024)   loperamide  (IMODIUM ) 2 MG capsule Take 1 capsule (2 mg total) by mouth 4 (four) times daily as needed for diarrhea or loose stools. (Patient not taking: Reported on 03/14/2024)   insulin  aspart protamine- aspart (NOVOLOG  MIX 70/30) (70-30) 100 UNIT/ML injection Inject 40 Units into the skin every evening.   INVEGA  SUSTENNA 156 MG/ML SUSY injection SMARTSIG:1 IM Once (Patient not taking: Reported on 03/14/2024)   cephALEXin  (KEFLEX ) 500 MG capsule  Take 1 capsule (500 mg total) by mouth 2 (two) times daily. (Patient not taking: Reported on 03/14/2024)   metroNIDAZOLE  (FLAGYL ) 500 MG tablet Take 1 tablet (500 mg total) by mouth 2 (two) times daily. (Patient not taking: Reported on 03/14/2024)   cloNIDine  (CATAPRES ) 0.2 MG tablet Take 0.5 tablets (0.1 mg total) by mouth at bedtime.   fluvoxaMINE  (LUVOX ) 50 MG tablet Take 1 tablet (50 mg total) by mouth at bedtime.   gabapentin  (NEURONTIN ) 300 MG capsule Take 1 capsule (300 mg total) by mouth 3 (three) times daily.   OLANZapine  (ZYPREXA ) 10 MG tablet Take 3 tablets (30 mg total) by mouth at bedtime. (Patient not taking: Reported on 03/14/2024)      Medical Decision Making  Due to active suicidal ideation with plan and inability to maintain safety, patient will be admitted to observation unit for safety and stabilization.  Patient will be reassessed by psychiatric tomorrow for suicidal risk, mood, and anxiety . Further disposition pending reassessment for safety and mood stability.     Recommendations  Based on my evaluation the patient does not appear to have an emergency medical condition.  Kathryne DELENA Show, NP 04/29/2024  7:03 AM

## 2024-05-02 ENCOUNTER — Emergency Department (HOSPITAL_COMMUNITY)
Admission: EM | Admit: 2024-05-02 | Discharge: 2024-05-03 | Disposition: A | Discharge status: Other Acute Inpatient Hospital | Payer: MEDICAID | Attending: Emergency Medicine | Admitting: Emergency Medicine

## 2024-05-02 DIAGNOSIS — E119 Type 2 diabetes mellitus without complications: Secondary | ICD-10-CM | POA: Insufficient documentation

## 2024-05-02 DIAGNOSIS — Z79899 Other long term (current) drug therapy: Secondary | ICD-10-CM | POA: Insufficient documentation

## 2024-05-02 DIAGNOSIS — F419 Anxiety disorder, unspecified: Secondary | ICD-10-CM

## 2024-05-02 DIAGNOSIS — Z794 Long term (current) use of insulin: Secondary | ICD-10-CM | POA: Diagnosis not present

## 2024-05-02 DIAGNOSIS — T1491XA Suicide attempt, initial encounter: Secondary | ICD-10-CM

## 2024-05-02 DIAGNOSIS — I10 Essential (primary) hypertension: Secondary | ICD-10-CM | POA: Insufficient documentation

## 2024-05-02 DIAGNOSIS — X838XXA Intentional self-harm by other specified means, initial encounter: Secondary | ICD-10-CM | POA: Insufficient documentation

## 2024-05-02 DIAGNOSIS — Z7984 Long term (current) use of oral hypoglycemic drugs: Secondary | ICD-10-CM | POA: Diagnosis not present

## 2024-05-02 DIAGNOSIS — F319 Bipolar disorder, unspecified: Secondary | ICD-10-CM | POA: Diagnosis not present

## 2024-05-02 DIAGNOSIS — F29 Unspecified psychosis not due to a substance or known physiological condition: Secondary | ICD-10-CM | POA: Insufficient documentation

## 2024-05-02 DIAGNOSIS — T426X2A Poisoning by other antiepileptic and sedative-hypnotic drugs, intentional self-harm, initial encounter: Secondary | ICD-10-CM | POA: Insufficient documentation

## 2024-05-02 LAB — URINE DRUG SCREEN
Amphetamines: NEGATIVE
Barbiturates: NEGATIVE
Benzodiazepines: POSITIVE — AB
Cocaine: NEGATIVE
Fentanyl: NEGATIVE
Methadone Scn, Ur: NEGATIVE
Opiates: NEGATIVE
Tetrahydrocannabinol: NEGATIVE

## 2024-05-02 LAB — COMPREHENSIVE METABOLIC PANEL WITH GFR
ALT: 27 U/L (ref 0–44)
AST: 28 U/L (ref 15–41)
Albumin: 4.4 g/dL (ref 3.5–5.0)
Alkaline Phosphatase: 57 U/L (ref 38–126)
Anion gap: 10 (ref 5–15)
BUN: 9 mg/dL (ref 6–20)
CO2: 27 mmol/L (ref 22–32)
Calcium: 9.8 mg/dL (ref 8.9–10.3)
Chloride: 101 mmol/L (ref 98–111)
Creatinine, Ser: 0.7 mg/dL (ref 0.44–1.00)
GFR, Estimated: >60 mL/min
Glucose, Bld: 119 mg/dL — ABNORMAL HIGH (ref 70–99)
Potassium: 3.6 mmol/L (ref 3.5–5.1)
Sodium: 138 mmol/L (ref 135–145)
Total Bilirubin: 0.4 mg/dL (ref 0.0–1.2)
Total Protein: 7.5 g/dL (ref 6.5–8.1)

## 2024-05-02 LAB — HCG, SERUM, QUALITATIVE: Preg, Serum: NEGATIVE

## 2024-05-02 LAB — CBC
HCT: 39.3 % (ref 36.0–46.0)
Hemoglobin: 12.6 g/dL (ref 12.0–15.0)
MCH: 25.8 pg — ABNORMAL LOW (ref 26.0–34.0)
MCHC: 32.1 g/dL (ref 30.0–36.0)
MCV: 80.4 fL (ref 80.0–100.0)
Platelets: 238 K/uL (ref 150–400)
RBC: 4.89 MIL/uL (ref 3.87–5.11)
RDW: 14.7 % (ref 11.5–15.5)
WBC: 10.7 K/uL — ABNORMAL HIGH (ref 4.0–10.5)
nRBC: 0 % (ref 0.0–0.2)

## 2024-05-02 LAB — ETHANOL: Alcohol, Ethyl (B): <15 mg/dL

## 2024-05-02 LAB — ACETAMINOPHEN LEVEL: Acetaminophen (Tylenol), Serum: <10 ug/mL — ABNORMAL LOW (ref 10–30)

## 2024-05-02 LAB — SALICYLATE LEVEL: Salicylate Lvl: <7 mg/dL — ABNORMAL LOW (ref 7.0–30.0)

## 2024-05-02 MED ORDER — PALIPERIDONE ER 6 MG PO TB24
6.0000 mg | ORAL_TABLET | Freq: Every day | ORAL | Status: DC
  Administered 2024-05-02 – 2024-05-03 (×2): 6 mg via ORAL
  Filled 2024-05-02 (×2): qty 1

## 2024-05-02 MED ORDER — GLIPIZIDE ER 10 MG PO TB24
10.0000 mg | ORAL_TABLET | Freq: Every day | ORAL | Status: DC
  Administered 2024-05-03: 10 mg via ORAL
  Filled 2024-05-02: qty 1

## 2024-05-02 MED ORDER — IBUPROFEN 200 MG PO TABS: 600.0000 mg | ORAL_TABLET | Freq: Once | ORAL | Status: DC

## 2024-05-02 MED ORDER — LORAZEPAM 0.5 MG PO TABS: 0.5000 mg | ORAL_TABLET | Freq: Two times a day (BID) | ORAL | Status: DC | PRN

## 2024-05-02 MED ORDER — AMLODIPINE BESYLATE 5 MG PO TABS
5.0000 mg | ORAL_TABLET | Freq: Every day | ORAL | Status: DC
  Administered 2024-05-02 – 2024-05-03 (×2): 5 mg via ORAL
  Filled 2024-05-02 (×2): qty 1

## 2024-05-02 MED ORDER — NICOTINE 21 MG/24HR TD PT24
21.0000 mg | MEDICATED_PATCH | Freq: Every day | TRANSDERMAL | Status: DC
  Filled 2024-05-02 (×2): qty 1

## 2024-05-02 NOTE — ED Notes (Signed)
 Patient is back in HALL B from TTS resting.

## 2024-05-02 NOTE — BH Assessment (Signed)
 Comprehensive Clinical Assessment (CCA) Note   05/02/2024 [name] [id number]  Disposition: Per Gaither Pouch, NP patient is recommended for inpatient admission. Disposition SW to pursue appropriate inpatient options.  The patient demonstrates the following risk factors for suicide: Chronic risk factors for suicide include: previous suicide attempts  . Acute risk factors for suicide include: family or marital conflict. Protective factors for this patient include: positive social support. Considering these factors, the overall suicide risk at this point appears to be high. Patient is not appropriate for outpatient follow up.   Patient is a 34 year old female with a history of bipolar disorder who presents voluntarily to [facility] ED for an assessment. Patient resides in the home alone and support is needed. Patient reports isolation, crying spells, irritability, hopelessness, guilt, loss of interest to do things they enjoy, fatigue, lack of concentration, worthlessness, change in sleep, change in appetite. Patient reports history of past suicide attempts, last occurrence was ealier today.  Patient denies a  Substance Abuse. Patient denies NSSIB, SI, HI, AVH.  Patient identifies her primary stressors as her medications not working and her strain relationship with her mother.  Patient denies history of abuse or trauma. Patient denies current legal problems. Patient is receiving outpatient therapy and [facility]ry services, with [facility] . Patient reports she takes her medications as prescribed (see MAR) and denies recent medication changes. Patient reports previous inpatient admission at [facility] overnight observation on 04/28/2024. Patient denies access to weapons.   Patient is can to contract for safety outside of the hospital.      During evaluation pt is in no acute distress. She is alert, oriented x 4, calm, cooperative and attentive. her mood is depressed, flat, and labile with congruent affect. She  has normal speech, and behavior.  Objectively there is no evidence of psychosis/mania or delusional thinking.  Patient is able to converse coherently, goal directed thoughts, no distractibility, or pre-occupation.   She also denies suicidal/self-harm/homicidal ideation, psychosis, and paranoia.  Patient answered question appropriately.       Chief Complaint:  Chief Complaint  Patient presents with   Drug Overdose   Visit Diagnosis: Bipolar    CCA Screening, Triage and Referral (STR)  Patient Reported Information How did you hear about us ? -- ([facility] ED)  What Is the Reason for Your Visit/Call Today? [name] is a 34 year old female with a history of anxiety, cognitive deficits, hypertension, diabetes with multiple prior mental health visits presenting today with police after an intentional overdose.  Patient reports that recently her anxiety has been out of control despite taking her medications as prescribed.  Today her anxiety was so out of control that she just felt like she did not want to live anymore and she overdosed on her gabapentin .  She reports she has really not been eating much but she does seem to sleep okay.  She reports she took for 300 mg tablets approximately 1 hour before she got here.  She reports she still feeling suicidal.  She other wise has no other complaints.  She does feel mildly drowsy.  She does not use any alcohol or drugs but does report using tobacco.  She states her last menses was about a month ago but she said she did have unprotected sex with someone about 5 days ago so she may be pregnant now.  How Long Has This Been Causing You Problems? > than 6 months  What Do You Feel Would Help You the Most Today? Treatment for Depression or  other mood problem; Stress Management; Medication(s)   Have You Recently Had Any Thoughts About Hurting Yourself? Yes  Are You Planning to Commit Suicide/Harm Yourself At This time? Yes (Pt reports that she  overdosed)   Flowsheet Row ED from 05/02/2024 in [facility] Emergency Department at [facility] ED from 04/28/2024 in [facility] ED from 04/05/2024 in [facility] Emergency Department at [facility]  C-SSRS RISK CATEGORY High Risk High Risk No Risk    Have you Recently Had Thoughts About Hurting Someone Sherral? No  Are You Planning to Harm Someone at This Time? No  Explanation: Denies HI   Have You Used Any Alcohol or Drugs in the Past 24 Hours? No  How Long Ago Did You Use Drugs or Alcohol? N/a What Did You Use and How Much? N/a  Do You Currently Have a Therapist/Psychiatrist? Yes  Name of Therapist/Psychiatrist: Name of Therapist/Psychiatrist: [facility] with [facility]   Have You Been Recently Discharged From Any Office Practice or Programs? No  Explanation of Discharge From Practice/Program: n/a    CCA Screening Triage Referral Assessment Type of Contact: Tele-Assessment  Telemedicine Service Delivery: Telemedicine service delivery: This service was provided via telemedicine using a 2-way, interactive audio and video technology  Is this Initial or Reassessment? Is this Initial or Reassessment?: Initial Assessment  Date Telepsych consult ordered in [facility]:  Date Telepsych consult ordered in [facility]: 05/02/24  Time Telepsych consult ordered in [facility]:  Time Telepsych consult ordered in [facility]: 2223  Location of Assessment: [facility] ED  Provider Location: [facility] Assessment Services   Collateral Involvement: n/a   Does Patient Have a Automotive Engineer Guardian? No  Legal Guardian Contact Information: n/a  Copy of Legal Guardianship Form: -- (n/a)  Legal Guardian Notified of Arrival: -- (n/a)  Legal Guardian Notified of Pending Discharge: -- (n/a)  If Minor and Not Living with Parent(s), Who has Custody? n/a  Is CPS involved or ever been involved? Never  Is APS involved or ever been involved? Never   Patient Determined To Be At Risk  for Harm To Self or Others Based on Review of Patient Reported Information or Presenting Complaint? Yes, for Self-Harm  Method: Plan with intent and identified person  Availability of Means: In hand or used  Intent: Intends to cause physical harm but not necessarily death  Notification Required: No need or identified person  Additional Information for Danger to Others Potential: -- (n/a)  Additional Comments for Danger to Others Potential: n/a  Are There Guns or Other Weapons in Your Home? No  Types of Guns/Weapons: Denies  Are These Weapons Safely Secured?                            No  Who Could Verify You Are Able To Have These Secured: Denies access  Do You Have any Outstanding Charges, Pending Court Dates, Parole/Probation? Denies pending legal charges  Contacted To Inform of Risk of Harm To Self or Others: -- (n/a)    Does Patient Present under Involuntary Commitment? No    [place] of Residence: [place]   Patient Currently Receiving the Following Services: ACTT Psychologist, Educational); Medication Management   Determination of Need: Urgent (48 hours)   Options For Referral: Medication Management; Inpatient Hospitalization     CCA Biopsychosocial Patient Reported Schizophrenia/Schizoaffective Diagnosis in Past: No   Strengths: Pt agreeable to help.   Mental Health Symptoms Depression:  Irritability;  Sleep (too much or little); Increase/decrease in appetite; Tearfulness   Duration of Depressive symptoms: Duration of Depressive Symptoms: Greater than two weeks   Mania:  None   Anxiety:   Irritability; Sleep; Worrying   Psychosis:  None   Duration of Psychotic symptoms:    Trauma:  None   Obsessions:  None   Compulsions:  None   Inattention:  N/A   Hyperactivity/Impulsivity:  Feeling of restlessness; Fidgets with hands/feet   Oppositional/Defiant Behaviors:  Angry; Argumentative; Temper   Emotional Irregularity:  Recurrent suicidal  behaviors/gestures/threats; Potentially harmful impulsivity; Mood lability   Other Mood/Personality Symptoms:  NA    Mental Status Exam Appearance and self-care  Stature:  Average   Weight:  Obese   Clothing:  Casual   Grooming:  Normal   Cosmetic use:  Age appropriate   Posture/gait:  Normal   Motor activity:  Not Remarkable   Sensorium  Attention:  Normal   Concentration:  Normal   Orientation:  X5   Recall/memory:  Normal   Affect and Mood  Affect:  Congruent   Mood:  Euthymic   Relating  Eye contact:  Normal   Facial expression:  Responsive   Attitude toward examiner:  Cooperative   Thought and Language  Speech flow: Normal   Thought content:  Appropriate to Mood and Circumstances   Preoccupation:  None   Hallucinations:  None   Organization:  Coherent   [employer] of Knowledge:  Fair   Intelligence:  Needs investigation (Per chart: Borderline Intellectual Functioning)   Abstraction:  Functional   Judgement:  Poor   Reality Testing:  Distorted   Insight:  Fair   Decision Making:  Impulsive   Social Functioning  Social Maturity:  Impulsive; Irresponsible   Social Judgement:  Heedless   Stress  Stressors:  Family conflict   Coping Ability:  Overwhelmed   Skill Deficits:  Self-control; Interpersonal; Communication; Decision making   Supports:  Family     Religion: Religion/Spirituality Are You A Religious Person?: Yes What is Your Religious Affiliation?: Christian How Might This Affect Treatment?: NA  Leisure/Recreation: Leisure / Recreation Do You Have Hobbies?: No  Exercise/Diet: Exercise/Diet Do You Exercise?: No Have You Gained or Lost A Significant Amount of Weight in the Past Six Months?: No Do You Follow a Special Diet?: No Do You Have Any Trouble Sleeping?: Yes Explanation of Sleeping Difficulties: Pt reports getting five hours of sleep.   CCA Employment/Education Employment/Work  Situation: Employment / Work Situation Employment Situation: On disability Why is Patient on Disability: Mental health. How Long has Patient Been on Disability: Unsure. Patient's Job has Been Impacted by Current Illness: No Has Patient ever Been in the [employer]?: No  Education: Education Is Patient Currently Attending School?: No Last Grade Completed: 11 Did You Attend College?: No Did You Have An Individualized Education Program (IIEP): No Did You Have Any Difficulty At School?: No Patient's Education Has Been Impacted by Current Illness: No   CCA Family/Childhood History Family and Relationship History: Family history Marital status: Single Does patient have children?: Yes How many children?: 3 How is patient's relationship with their children?: Pt reports, she has three children (11, 15, 62) whos been adopted.  Childhood History:  Childhood History By whom was/is the patient raised?: Mother Did patient suffer any verbal/emotional/physical/sexual abuse as a child?: No Did patient suffer from severe childhood neglect?: No Has patient ever been sexually abused/assaulted/raped as an adolescent or adult?: No Was the patient ever  a victim of a crime or a disaster?: No Witnessed domestic violence?: No Has patient been affected by domestic violence as an adult?: No       CCA Substance Use Alcohol/Drug Use: Alcohol / Drug Use Pain Medications: See MAR Prescriptions: See MAR Over the Counter: See MAR History of alcohol / drug use?: No history of alcohol / drug abuse Longest period of sobriety (when/how long): NA Negative Consequences of Use:  (NA) Withdrawal Symptoms: None                         ASAM's:  Six Dimensions of Multidimensional Assessment  Dimension 1:  Acute Intoxication and/or Withdrawal Potential:   Dimension 1:  Description of individual's past and current experiences of substance use and withdrawal: NA  Dimension 2:  Biomedical Conditions  and Complications:   Dimension 2:  Description of patient's biomedical conditions and  complications: NA  Dimension 3:  Emotional, Behavioral, or Cognitive Conditions and Complications:  Dimension 3:  Description of emotional, behavioral, or cognitive conditions and complications: NA  Dimension 4:  Readiness to Change:  Dimension 4:  Description of Readiness to Change criteria: NA  Dimension 5:  Relapse, Continued use, or Continued Problem Potential:  Dimension 5:  Relapse, continued use, or continued problem potential critiera description: NA  Dimension 6:  Recovery/Living Environment:  Dimension 6:  Recovery/Iiving environment criteria description: NA  ASAM Severity Score: ASAM's Severity Rating Score: 0  ASAM Recommended Level of Treatment: ASAM Recommended Level of Treatment:  (n/a)   Substance use Disorder (SUD) Substance Use Disorder (SUD)  Checklist Symptoms of Substance Use:  (Pt denies substance use)  Recommendations for Services/Supports/Treatments: Recommendations for Services/Supports/Treatments Recommendations For Services/Supports/Treatments: Inpatient Hospitalization  Disposition Recommendation per psychiatric provider: We recommend inpatient psychiatric hospitalization when medically cleared. Patient is under voluntary admission status at this time; please IVC if attempts to leave hospital.   DSM5 Diagnoses: Patient Active Problem List   Diagnosis Date Noted   Suicide attempt by benzodiazepine overdose (HCC) 03/14/2024   Altered mental status 02/11/2024   Agitation 09/28/2022   Suicidal ideations 01/04/2022   Suicidal overdose (HCC) 03/23/2021   Syphilis 07/15/2020   Malingering 06/05/2020   Gastroesophageal reflux disease 05/04/2020   Hyperglycemia due to type 2 diabetes mellitus (HCC) 05/04/2020   Long term (current) use of insulin  (HCC) 05/04/2020   Migraine without aura 05/04/2020   Morbid obesity (HCC) 05/04/2020   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 2 diabetes mellitus  (HCC) 05/04/2020   Prolapsed internal hemorrhoids 05/04/2020   Vitamin D  deficiency 05/04/2020   Other symptoms and signs involving cognitive functions and awareness 05/04/2020   Suicide attempt [facility])    Anxiety state 03/06/2020   Schizophrenia (HCC) 09/13/2019   Bipolar I disorder, most recent episode depressed (HCC) 06/23/2019   MDD (major depressive disorder) 10/10/2018   Schizoaffective disorder, bipolar type (HCC) 09/25/2018   Affective psychosis, bipolar (HCC) 06/13/2018   HTN (hypertension) 05/03/2018   Tobacco use disorder 05/03/2018   Adjustment disorder with emotional disturbance 01/02/2018   Schizophrenia, 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   Moderate bipolar I disorder, most recent episode depressed (HCC)    Schizoaffective disorder (HCC)    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/2017   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   OCD (obsessive compulsive disorder) 10/05/2016   Major depressive disorder, recurrent episode, mild 05/04/2016   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   Learning disability 07/18/2015   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/2015   Diabetes  mellitus (HCC) 07/18/2015   MDD (major depressive disorder), recurrent, severe, 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   Hyperlipidemia 07/18/2015   Severe episode of recurrent major depressive disorder, without psychotic features (HCC)    Suicidal ideation    OD (overdose of drug), intentional self-harm, initial encounter [facility])    Cognitive deficits 10/12/2012   Generalized anxiety disorder 06/28/2012     Referrals to Alternative Service(s): Referred to Alternative Service(s):   Place:   Date:   Time:    Referred to Alternative Service(s):   Place:   Date:   Time:    Referred to Alternative Service(s):   Place:   Date:   Time:    Referred to Alternative Service(s):   Place:   Date:   Time:     Rosina PARAS, [place], [facility] 2

## 2024-05-02 NOTE — ED Triage Notes (Addendum)
 Pt arrives via [employer] c/o SI with intentional overdose and took 4 gabapentin  capsules (300mg  each, 1200mg  total) PTA. Pt is not in police custody.

## 2024-05-02 NOTE — Progress Notes (Signed)
 Inpatient Psychiatric Referral  Patient was recommended inpatient per Gaither Pouch, NP. There are no available beds at [facility], per [facility]. Patient was referred to the following out of network facilities:  Destination  Service Provider Address Phone Fax  [facility]  [address], [place] [address] [phone]  [facility]  [address] [place], [place] [address] [phone]  [facility]  [address] [place] [address] [phone]  [facility]  [address] [place] [address] [phone]  [facility]  [address], [place] [address] [phone]  [facility]  [address] [place] [address] [phone]  [facility]  [address] [place] [address] [phone]  [facility] EFAX  [address] [place], [place] [phone]  [facility]  [address] [place] [address] [phone]  [facility]  [address] [place] [address] [phone]    Situation ongoing, CSW to continue following and update chart as more information becomes available.   Harrie Sofia MSW, [place] 05/02/2024

## 2024-05-02 NOTE — ED Provider Notes (Signed)
 " [place] EMERGENCY DEPARTMENT AT [facility] Provider Note   CSN: [id number] Arrival date & time: 05/02/24  1556     Patient presents with: Drug Overdose   [name] is a 34 y.o. female.   Patient is a 34 year old female with a history of anxiety, cognitive deficits, hypertension, diabetes with multiple prior mental health visits presenting today with police after an intentional overdose.  Patient reports that recently her anxiety has been out of control despite taking her medications as prescribed.  Today her anxiety was so out of control that she just felt like she did not want to live anymore and she overdosed on her gabapentin .  She reports she has really not been eating much but she does seem to sleep okay.  She reports she took for 300 mg tablets approximately 1 hour before she got here.  She reports she still feeling suicidal.  She other wise has no other complaints.  She does feel mildly drowsy.  She does not use any alcohol or drugs but does report using tobacco.  She states her last menses was about a month ago but she said she did have unprotected sex with someone about 5 days ago so she may be pregnant now.  The history is provided by the patient and the police.  Drug Overdose       Prior to Admission medications  Medication Sig Start Date End Date Taking? Authorizing Provider  amLODipine  (NORVASC ) 5 MG tablet Take 5 mg by mouth daily.    [provider]  cloNIDine  (CATAPRES ) 0.2 MG tablet Take 0.5 tablets (0.1 mg total) by mouth at bedtime. Patient not taking: Reported on 04/29/2024 02/11/24   [name], MD  fluvoxaMINE  (LUVOX ) 50 MG tablet Take 1 tablet (50 mg total) by mouth at bedtime. Patient not taking: Reported on 04/29/2024 02/11/24   [name], MD  gabapentin  (NEURONTIN ) 300 MG capsule Take 1 capsule (300 mg total) by mouth 3 (three) times daily. 02/11/24   [name], MD  glipiZIDE  (GLUCOTROL  XL) 10 MG 24 hr tablet  Take 10 mg by mouth daily with breakfast.    [provider]  insulin  aspart protamine- aspart (NOVOLOG  MIX 70/30) (70-30) 100 UNIT/ML injection Inject 40 Units into the skin every evening.    [provider]  INVEGA  SUSTENNA 156 MG/ML SUSY injection  11/26/23   [provider]  LORazepam  (ATIVAN ) 0.5 MG tablet Take 0.5 mg by mouth 2 (two) times daily as needed for anxiety.    [provider]  OLANZapine  (ZYPREXA ) 10 MG tablet Take 3 tablets (30 mg total) by mouth at bedtime. Patient not taking: Reported on 03/14/2024 02/11/24   [name], MD  paliperidone  (INVEGA ) 6 MG 24 hr tablet Take 6 mg by mouth daily.    [provider]  traZODone  (DESYREL ) 150 MG tablet Take 1 tablet (150 mg total) by mouth at bedtime. 04/13/23   Rollene Katz, MD    Allergies: Wellbutrin  [bupropion ], Buspar  Delayna.dallas ], Contrast media [iodinated contrast media], Omnipaque  [iohexol ], Penicillins, Atarax  [hydroxyzine ], and Cymbalta  [duloxetine  hcl]    Review of Systems  Updated Vital Signs BP 128/66 (BP Location: Left Arm)   Pulse 79   Temp 97.9 F (36.6 C) (Oral)   Resp 18   SpO2 97%   Physical Exam Vitals and nursing note reviewed.  Constitutional:      General: She is not in acute distress.    Appearance: She is well-developed.  HENT:  Head: Normocephalic and atraumatic.  Eyes:     Pupils: Pupils are equal, round, and reactive to light.  Cardiovascular:     Rate and Rhythm: Normal rate and regular rhythm.     Heart sounds: Normal heart sounds. No murmur heard.    No friction rub.  Pulmonary:     Effort: Pulmonary effort is normal.     Breath sounds: Normal breath sounds. No wheezing or rales.  Abdominal:     General: Bowel sounds are normal. There is no distension.     Palpations: Abdomen is soft.     Tenderness: There is no abdominal tenderness. There is no guarding or rebound.  Musculoskeletal:        General: No tenderness. Normal  range of motion.     Comments: No edema  Skin:    General: Skin is warm and dry.     Findings: No rash.  Neurological:     Mental Status: She is alert and oriented to person, place, and time. Mental status is at baseline.     Cranial Nerves: No cranial nerve deficit.  Psychiatric:        Mood and Affect: Affect is flat.        Behavior: Behavior is cooperative.        Thought Content: Thought content includes suicidal ideation. Thought content includes suicidal plan.     (all labs ordered are listed, but only abnormal results are displayed) Labs Reviewed  COMPREHENSIVE METABOLIC PANEL WITH GFR - Abnormal; Notable for the following components:      Result Value   Glucose, Bld 119 (*)    All other components within normal limits  CBC - Abnormal; Notable for the following components:   WBC 10.7 (*)    MCH 25.8 (*)    All other components within normal limits  URINE DRUG SCREEN - Abnormal; Notable for the following components:   Benzodiazepines POSITIVE (*)    All other components within normal limits  SALICYLATE LEVEL - Abnormal; Notable for the following components:   Salicylate Lvl <7.0 (*)    All other components within normal limits  ACETAMINOPHEN  LEVEL - Abnormal; Notable for the following components:   Acetaminophen  (Tylenol ), Serum <10 (*)    All other components within normal limits  ETHANOL  HCG, SERUM, QUALITATIVE    EKG: None  Radiology: No results found.   Procedures   Medications Ordered in the ED  ibuprofen  (ADVIL ) tablet 600 mg (0 mg Oral Hold 05/02/24 1827)                                    Medical Decision Making Amount and/or Complexity of Data Reviewed Labs: ordered. Decision-making details documented in ED Course.  Risk OTC drugs.   Pt with multiple medical problems and comorbidities and presenting today with a complaint that caries a high risk for morbidity and mortality.  Here today with suicidal ideation and overdosed on her gabapentin .   Patient is slightly drowsy but otherwise appropriate at this time but still complains of being suicidal.  She has had multiple visits recently for mental health and reports she has been hospitalized twice but it does not feel like it is helping her.  She is compliant with her medications.  Spoke with poison control.  They recommend an EKG and checking salicylates and acetaminophen  at 1930.  If these are normal then patient is medically clear. I  independently interpreted patient's labs and all labs are within normal limits.  Vital signs are normal.  Patient is currently medically clear.  TTS to evaluate.     Final diagnoses:  Suicide attempt [facility])  Anxiety    ED Discharge Orders     None          Doretha Folks, MD 05/02/24 2108  "

## 2024-05-03 MED ORDER — ACETAMINOPHEN 500 MG PO TABS
1000.0000 mg | ORAL_TABLET | Freq: Once | ORAL | Status: AC
  Administered 2024-05-03: 1000 mg via ORAL
  Filled 2024-05-03: qty 2

## 2024-05-03 NOTE — ED Notes (Signed)
 Pt refusing to go to [facility] due to distance.

## 2024-05-03 NOTE — ED Notes (Signed)
 Pt refuses to go to [facility] via [employer], insists on IVC. MD notified.

## 2024-05-03 NOTE — Progress Notes (Signed)
 Pt has been accepted to [facility] on 05/03/2024 . Bed assignment: Main campus  Pt meets inpatient criteria per Gaither Pouch, NP   Attending Physician will be Millie Manners, MD  Report can be called to: [phone] (this is a pager, please leave call-back number when giving report)  Pt can arrive after 8 AM  Care Team Notified: Chauncey Hammonds, RN

## 2024-05-03 NOTE — ED Notes (Signed)
 [facility] [facility] has been called for transport, they will call with a time frame for pick up.

## 2024-05-03 NOTE — ED Notes (Signed)
 Report given to Sheppard Babe, RN from [facility]. [facility] called.

## 2024-05-03 NOTE — ED Provider Notes (Addendum)
" °  [name] EMERGENCY DEPARTMENT AT [facility] Emergency Medicine Observation Re-evaluation Note  [name] is a 34 y.o. female, seen on rounds today.  Pt initially presented on 05/02/24 at 1556 to the ED for complaints of  Chief Complaint  Patient presents with   Drug Overdose  Intentional overdose on gabapentin  as a suicide attempt in the setting of 1 that her anxiety was not adequately controlled. PMHx: anxiety, cognitive deficits, hypertension, diabetes with multiple prior mental health visits  Currently, the patient is resting quietly in bed, patient denies any acute complaints.  Physical Exam  BP 126/80 (BP Location: Right Arm)   Pulse (!) 110   Temp 98.2 F (36.8 C) (Oral)   Resp 20   SpO2 100%  Physical Exam General: NAD Lungs: Normal effort Psych: Currently calm  ED Course / MDM  EKG:EKG Interpretation Date/Time:    Ventricular Rate:    PR Interval:    QRS Duration:    QT Interval:    QTC Calculation:   R Axis:      Text Interpretation:    I have reviewed the labs performed to date as well as medications administered while in observation.  Recent changes in the last 24 hours include patient was evaluated by psychiatry on 1/6, recommended for inpatient admission. Home medications: Reordered by prior to Diet: Reordered by prior team  Plan  Current plan is for awaiting psychiatric placement.  Patient was accepted at psychiatric facility, [facility], however he refused to go by safe transport.  Patient presented for suicidal ideation and suicide attempt via intentional gabapentin  overdose, therefore do feel that patient requires inpatient hospitalization as per psychiatry recommendations, thus IVC paperwork completed such that patient can be transported to IVC for continuation of care.  Thereafter, patient complained of difficulty, requested medication, Tylenol  administered.  Became available for patient at [facility], EMTALA initiated, part 1 and  part 4 completed on arrival of [name].   Stacy Jerilynn RAMAN, MD 05/03/24 0720    Stacy Jerilynn RAMAN, MD 05/03/24 1331    Stacy Jerilynn RAMAN, MD 05/03/24 1358  "

## 2024-05-08 ENCOUNTER — Ambulatory Visit: Payer: MEDICAID | Admitting: Endocrinology

## 2024-05-20 ENCOUNTER — Encounter (HOSPITAL_COMMUNITY): Payer: Self-pay

## 2024-05-20 ENCOUNTER — Emergency Department (HOSPITAL_COMMUNITY)
Admission: EM | Admit: 2024-05-20 | Discharge: 2024-05-20 | Disposition: A | Discharge status: Home / Self Care | Payer: MEDICAID | Attending: Emergency Medicine | Admitting: Emergency Medicine

## 2024-05-20 ENCOUNTER — Other Ambulatory Visit: Payer: Self-pay

## 2024-05-20 ENCOUNTER — Observation Stay (HOSPITAL_COMMUNITY)
Admission: EM | Admit: 2024-05-20 | Discharge: 2024-05-24 | Disposition: A | Payer: MEDICAID | Attending: Infectious Diseases | Admitting: Infectious Diseases

## 2024-05-20 DIAGNOSIS — T50912A Poisoning by multiple unspecified drugs, medicaments and biological substances, intentional self-harm, initial encounter: Principal | ICD-10-CM | POA: Diagnosis present

## 2024-05-20 DIAGNOSIS — I10 Essential (primary) hypertension: Secondary | ICD-10-CM | POA: Diagnosis not present

## 2024-05-20 DIAGNOSIS — F1210 Cannabis abuse, uncomplicated: Secondary | ICD-10-CM | POA: Insufficient documentation

## 2024-05-20 DIAGNOSIS — X58XXXA Exposure to other specified factors, initial encounter: Secondary | ICD-10-CM | POA: Insufficient documentation

## 2024-05-20 DIAGNOSIS — E119 Type 2 diabetes mellitus without complications: Secondary | ICD-10-CM | POA: Diagnosis not present

## 2024-05-20 DIAGNOSIS — T1491XA Suicide attempt, initial encounter: Principal | ICD-10-CM

## 2024-05-20 DIAGNOSIS — F319 Bipolar disorder, unspecified: Secondary | ICD-10-CM | POA: Diagnosis not present

## 2024-05-20 DIAGNOSIS — I454 Nonspecific intraventricular block: Secondary | ICD-10-CM | POA: Diagnosis not present

## 2024-05-20 DIAGNOSIS — F411 Generalized anxiety disorder: Secondary | ICD-10-CM

## 2024-05-20 DIAGNOSIS — Z79899 Other long term (current) drug therapy: Secondary | ICD-10-CM | POA: Insufficient documentation

## 2024-05-20 DIAGNOSIS — T426X2A Poisoning by other antiepileptic and sedative-hypnotic drugs, intentional self-harm, initial encounter: Secondary | ICD-10-CM | POA: Diagnosis not present

## 2024-05-20 DIAGNOSIS — T424X2A Poisoning by benzodiazepines, intentional self-harm, initial encounter: Secondary | ICD-10-CM | POA: Diagnosis not present

## 2024-05-20 DIAGNOSIS — F1410 Cocaine abuse, uncomplicated: Secondary | ICD-10-CM | POA: Insufficient documentation

## 2024-05-20 DIAGNOSIS — F209 Schizophrenia, unspecified: Secondary | ICD-10-CM | POA: Insufficient documentation

## 2024-05-20 DIAGNOSIS — F3160 Bipolar disorder, current episode mixed, unspecified: Secondary | ICD-10-CM | POA: Insufficient documentation

## 2024-05-20 DIAGNOSIS — Z794 Long term (current) use of insulin: Secondary | ICD-10-CM | POA: Insufficient documentation

## 2024-05-20 DIAGNOSIS — F1910 Other psychoactive substance abuse, uncomplicated: Secondary | ICD-10-CM

## 2024-05-20 LAB — I-STAT CHEM 8, ED
BUN: 11 mg/dL (ref 6–20)
Calcium, Ion: 1.18 mmol/L (ref 1.15–1.40)
Chloride: 102 mmol/L (ref 98–111)
Creatinine, Ser: 0.7 mg/dL (ref 0.44–1.00)
Glucose, Bld: 157 mg/dL — ABNORMAL HIGH (ref 70–99)
HCT: 37 % (ref 36.0–46.0)
Hemoglobin: 12.6 g/dL (ref 12.0–15.0)
Potassium: 3.6 mmol/L (ref 3.5–5.1)
Sodium: 142 mmol/L (ref 135–145)
TCO2: 27 mmol/L (ref 22–32)

## 2024-05-20 LAB — CBC WITH DIFFERENTIAL/PLATELET
Abs Immature Granulocytes: 0.03 10*3/uL (ref 0.00–0.07)
Basophils Absolute: 0.1 10*3/uL (ref 0.0–0.1)
Basophils Relative: 1 %
Eosinophils Absolute: 0.2 10*3/uL (ref 0.0–0.5)
Eosinophils Relative: 2 %
HCT: 37.1 % (ref 36.0–46.0)
Hemoglobin: 12.1 g/dL (ref 12.0–15.0)
Immature Granulocytes: 0 %
Lymphocytes Relative: 26 %
Lymphs Abs: 2.1 10*3/uL (ref 0.7–4.0)
MCH: 26.3 pg (ref 26.0–34.0)
MCHC: 32.6 g/dL (ref 30.0–36.0)
MCV: 80.7 fL (ref 80.0–100.0)
Monocytes Absolute: 0.5 10*3/uL (ref 0.1–1.0)
Monocytes Relative: 6 %
Neutro Abs: 5 10*3/uL (ref 1.7–7.7)
Neutrophils Relative %: 65 %
Platelets: 220 10*3/uL (ref 150–400)
RBC: 4.6 MIL/uL (ref 3.87–5.11)
RDW: 14.6 % (ref 11.5–15.5)
WBC: 7.8 10*3/uL (ref 4.0–10.5)
nRBC: 0 % (ref 0.0–0.2)

## 2024-05-20 LAB — COMPREHENSIVE METABOLIC PANEL WITH GFR
ALT: 27 U/L (ref 0–44)
AST: 27 U/L (ref 15–41)
Albumin: 4.2 g/dL (ref 3.5–5.0)
Alkaline Phosphatase: 50 U/L (ref 38–126)
Anion gap: 8 (ref 5–15)
BUN: 11 mg/dL (ref 6–20)
CO2: 29 mmol/L (ref 22–32)
Calcium: 9.3 mg/dL (ref 8.9–10.3)
Chloride: 103 mmol/L (ref 98–111)
Creatinine, Ser: 0.76 mg/dL (ref 0.44–1.00)
GFR, Estimated: >60 mL/min
Glucose, Bld: 161 mg/dL — ABNORMAL HIGH (ref 70–99)
Potassium: 4 mmol/L (ref 3.5–5.1)
Sodium: 140 mmol/L (ref 135–145)
Total Bilirubin: 0.4 mg/dL (ref 0.0–1.2)
Total Protein: 7.1 g/dL (ref 6.5–8.1)

## 2024-05-20 LAB — HCG, SERUM, QUALITATIVE: Preg, Serum: NEGATIVE

## 2024-05-20 LAB — CBG MONITORING, ED: Glucose-Capillary: 153 mg/dL — ABNORMAL HIGH (ref 70–99)

## 2024-05-20 LAB — URINE DRUG SCREEN
Amphetamines: NEGATIVE
Barbiturates: NEGATIVE
Benzodiazepines: NEGATIVE
Cocaine: POSITIVE — AB
Fentanyl: NEGATIVE
Methadone Scn, Ur: NEGATIVE
Opiates: NEGATIVE
Tetrahydrocannabinol: POSITIVE — AB

## 2024-05-20 LAB — BLOOD GAS, VENOUS
Acid-Base Excess: 3.8 mmol/L — ABNORMAL HIGH (ref 0.0–2.0)
Bicarbonate: 30.9 mmol/L — ABNORMAL HIGH (ref 20.0–28.0)
Drawn by: 66519
O2 Saturation: 87 %
Patient temperature: 36.2
pCO2, Ven: 54 mmHg (ref 44–60)
pH, Ven: 7.36 (ref 7.25–7.43)
pO2, Ven: 52 mmHg — ABNORMAL HIGH (ref 32–45)

## 2024-05-20 LAB — ACETAMINOPHEN LEVEL: Acetaminophen (Tylenol), Serum: <10 ug/mL — ABNORMAL LOW (ref 10–30)

## 2024-05-20 LAB — GLUCOSE, CAPILLARY
Glucose-Capillary: 189 mg/dL — ABNORMAL HIGH (ref 70–99)
Glucose-Capillary: 174 mg/dL — ABNORMAL HIGH (ref 70–99)
Glucose-Capillary: 169 mg/dL — ABNORMAL HIGH (ref 70–99)
Glucose-Capillary: 168 mg/dL — ABNORMAL HIGH (ref 70–99)

## 2024-05-20 LAB — SALICYLATE LEVEL: Salicylate Lvl: <7 mg/dL — ABNORMAL LOW (ref 7.0–30.0)

## 2024-05-20 LAB — PREGNANCY, URINE: Preg Test, Ur: NEGATIVE

## 2024-05-20 LAB — ETHANOL: Alcohol, Ethyl (B): <15 mg/dL

## 2024-05-20 MED ORDER — ENOXAPARIN SODIUM 40 MG/0.4ML IJ SOSY
40.0000 mg | PREFILLED_SYRINGE | INTRAMUSCULAR | Status: DC
  Administered 2024-05-20 – 2024-05-23 (×4): 40 mg via SUBCUTANEOUS
  Filled 2024-05-20 (×4): qty 0.4

## 2024-05-20 MED ORDER — ENOXAPARIN SODIUM 60 MG/0.6ML IJ SOSY: 60.0000 mg | PREFILLED_SYRINGE | INTRAMUSCULAR | Status: DC

## 2024-05-20 MED ORDER — LACTATED RINGERS IV SOLN: INTRAVENOUS | Status: AC

## 2024-05-20 MED ORDER — POLYETHYLENE GLYCOL 3350 17 G PO PACK: 17.0000 g | PACK | Freq: Every day | ORAL | Status: DC | PRN

## 2024-05-20 MED ORDER — ACETAMINOPHEN 650 MG RE SUPP: 650.0000 mg | Freq: Four times a day (QID) | RECTAL | Status: DC | PRN

## 2024-05-20 MED ORDER — DEXTROSE 50 % IV SOLN: 1.0000 | INTRAVENOUS | Status: DC | PRN

## 2024-05-20 MED ORDER — ACETAMINOPHEN 325 MG PO TABS: 650.0000 mg | ORAL_TABLET | Freq: Four times a day (QID) | ORAL | Status: DC | PRN

## 2024-05-20 NOTE — ED Notes (Signed)
 Staffing called for suicide sitter, no sitters available at this time.

## 2024-05-20 NOTE — Progress Notes (Signed)
 Psychiatric Nurse Liaison (PNL) Rounding Note  Current SI/HI/AVH: Endorses SI with plan and intent to OD on medications. Contracts for safety while in the hospital. Denies HI/AVH.  Patient Mood/Affect: Flat affect  Noted Patient Behaviors: Patient flat and lethargic. Patient reports wanting inpatient treatment but states that she does not want to go to [facility].  Interventions Initiated by Psychiatric Nurse Liaison: Emotional support and therapeutic communication.  Recommendations for Patient Care: Emotional support and therapeutic communication.   Patients Response to Treatment: No change  Time Spent with Patient:   5 mins

## 2024-05-20 NOTE — ED Provider Notes (Signed)
 " [place] EMERGENCY DEPARTMENT AT [facility] Provider Note   CSN: [id number] Arrival date & time: 05/20/24  0124     Patient presents with: Addiction Problem   [name] is a 34 y.o. female.   The history is provided by the patient.  Drug / Alcohol Assessment Severity:  Mild Timing:  Constant Progression:  Resolved Chronicity:  New Suspected agents:  Crack and marijuana Associated symptoms: no abdominal pain, no confusion, no hallucinations, no seizures and no somnolence   Risk factors: no addiction treatment        Prior to Admission medications  Medication Sig Start Date End Date Taking? Authorizing Provider  amLODipine  (NORVASC ) 5 MG tablet Take 5 mg by mouth daily.    [provider]  cloNIDine  (CATAPRES ) 0.2 MG tablet Take 0.5 tablets (0.1 mg total) by mouth at bedtime. Patient not taking: Reported on 04/29/2024 02/11/24   [name], MD  fluvoxaMINE  (LUVOX ) 50 MG tablet Take 1 tablet (50 mg total) by mouth at bedtime. Patient not taking: Reported on 04/29/2024 02/11/24   [name], MD  gabapentin  (NEURONTIN ) 300 MG capsule Take 1 capsule (300 mg total) by mouth 3 (three) times daily. 02/11/24   [name], MD  glipiZIDE  (GLUCOTROL  XL) 10 MG 24 hr tablet Take 10 mg by mouth daily with breakfast.    [provider]  insulin  aspart protamine- aspart (NOVOLOG  MIX 70/30) (70-30) 100 UNIT/ML injection Inject 40 Units into the skin every evening.    [provider]  INVEGA  SUSTENNA 156 MG/ML SUSY injection  11/26/23   [provider]  LORazepam  (ATIVAN ) 0.5 MG tablet Take 0.5 mg by mouth 2 (two) times daily as needed for anxiety.    [provider]  OLANZapine  (ZYPREXA ) 10 MG tablet Take 3 tablets (30 mg total) by mouth at bedtime. Patient not taking: Reported on 03/14/2024 02/11/24   [name], MD  paliperidone  (INVEGA ) 6 MG 24 hr tablet Take 6 mg by mouth daily.    [provider]  traZODone  (DESYREL ) 150 MG tablet Take 1 tablet (150 mg total) by mouth at bedtime. 04/13/23   Rollene Katz, MD    Allergies: Wellbutrin  Queenie ], Buspar  [buspirone ], Contrast media [iodinated contrast media], Omnipaque  [iohexol ], Penicillins, Atarax  [hydroxyzine ], and Cymbalta  [duloxetine  hcl]    Review of Systems  Gastrointestinal:  Negative for abdominal pain.  Neurological:  Negative for seizures.  Psychiatric/Behavioral:  Negative for confusion and hallucinations.   All other systems reviewed and are negative.   Updated Vital Signs BP (!) 143/82   Pulse 76   Temp 97.6 F (36.4 C) (Oral)   Resp (!) 24   SpO2 100%   Physical Exam Vitals and nursing note reviewed.  Constitutional:      General: She is not in acute distress.    Appearance: Normal appearance. She is well-developed.  HENT:     Head: Normocephalic and atraumatic.     Nose: Nose normal.  Eyes:     Pupils: Pupils are equal, round, and reactive to light.  Cardiovascular:     Rate and Rhythm: Normal rate and regular rhythm.     Pulses: Normal pulses.     Heart sounds: Normal heart sounds.  Pulmonary:     Effort: Pulmonary effort is normal. No respiratory distress.     Breath sounds: Normal breath sounds.  Abdominal:     General: Bowel sounds are normal. There is no distension.     Palpations:  Abdomen is soft.     Tenderness: There is no abdominal tenderness. There is no guarding or rebound.  Musculoskeletal:        General: Normal range of motion.     Cervical back: Normal range of motion and neck supple.  Skin:    General: Skin is warm and dry.     Capillary Refill: Capillary refill takes less than 2 seconds.     Findings: No erythema or rash.  Neurological:     General: No focal deficit present.     Mental Status: She is alert.     Deep Tendon Reflexes: Reflexes normal.     (all labs ordered are listed, but only abnormal results are displayed) Labs Reviewed  URINE DRUG  SCREEN - Abnormal; Notable for the following components:      Result Value   Cocaine POSITIVE (*)    Tetrahydrocannabinol POSITIVE (*)    All other components within normal limits  PREGNANCY, URINE    EKG: None  Radiology: No results found.   Procedures   Medications Ordered in the ED - No data to display                                  Medical Decision Making Patient who used marijuana and crack and felt weird, this has resolved   Amount and/or Complexity of Data Reviewed Independent Historian: EMS    Details: See above  Labs: ordered.    Details: UDS is positive for cocaine and marijuana   Risk Risk Details: Resting comfortably in the bed.  Normal exam and vitals.  Do not use drugs, stable for discharge.      Final diagnoses:  Polysubstance abuse (HCC)  No signs of systemic illness or infection. The patient is nontoxic-appearing on exam and vital signs are within normal limits.  I have reviewed the triage vital signs and the nursing notes. Pertinent labs & imaging results that were available during my care of the patient were reviewed by me and considered in my medical decision making (see chart for details). After history, exam, and medical workup I feel the patient has been appropriately medically screened and is safe for discharge home. Pertinent diagnoses were discussed with the patient. Patient was given return precautions.    ED Discharge Orders     None          Arda Daggs, MD 05/20/24 0409  "

## 2024-05-20 NOTE — ED Notes (Signed)
 IVC FINDING AND CUSTODY COMPLETED BY [employer] COPIES MADE ORIGINAL IN THE RED FOLDER  FINDING AND CUSTODY  UP LOADED TO THE CHART AND SENT TO  THE COURT OF CLERK

## 2024-05-20 NOTE — ED Notes (Signed)
 Pt belongings left in locker #5 in purple.

## 2024-05-20 NOTE — ED Provider Notes (Signed)
 " [place] EMERGENCY DEPARTMENT AT [facility] Provider Note   CSN: [id number] Arrival date & time: 05/20/24  1434     Patient presents with: No chief complaint on file.   [name] is a 34 y.o. female.  {Add pertinent medical, surgical, social history, OB history to HPI:356} 34 year old female history of bipolar and depression with prior suicide attempts who presents to the emergency department after a suicide attempt.  Approximately 45 minutes ago patient took multiple of her pills in an attempt to end her life.  Says that she took 4 pills of her 1 mg Ativan  as well as 4 pills of her gabapentin  and 4-5 of her buspirone .  Also thinks that she took 2 doses of her other daily medications but is unsure exactly which ones.  Is also on 40 mg of simvastatin , trazodone  150 mg, omeprazole  20mg , amlodipine  5 mg, fluoxetine  20 mg, gabapentin  300 mg, glimepiride  4 mg, glipizide  10 mg, paliperidone  6 mg, and metoprolol  to tartrate 25 mg       Prior to Admission medications  Medication Sig Start Date End Date Taking? Authorizing Provider  amLODipine  (NORVASC ) 5 MG tablet Take 5 mg by mouth daily.    [provider]  cloNIDine  (CATAPRES ) 0.2 MG tablet Take 0.5 tablets (0.1 mg total) by mouth at bedtime. Patient not taking: Reported on 04/29/2024 02/11/24   [name], MD  fluvoxaMINE  (LUVOX ) 50 MG tablet Take 1 tablet (50 mg total) by mouth at bedtime. Patient not taking: Reported on 04/29/2024 02/11/24   [name], MD  gabapentin  (NEURONTIN ) 300 MG capsule Take 1 capsule (300 mg total) by mouth 3 (three) times daily. 02/11/24   [name], MD  glipiZIDE  (GLUCOTROL  XL) 10 MG 24 hr tablet Take 10 mg by mouth daily with breakfast.    [provider]  insulin  aspart protamine- aspart (NOVOLOG  MIX 70/30) (70-30) 100 UNIT/ML injection Inject 40 Units into the skin every evening.    [provider]  INVEGA  SUSTENNA 156 MG/ML SUSY injection   11/26/23   [provider]  LORazepam  (ATIVAN ) 0.5 MG tablet Take 0.5 mg by mouth 2 (two) times daily as needed for anxiety.    [provider]  OLANZapine  (ZYPREXA ) 10 MG tablet Take 3 tablets (30 mg total) by mouth at bedtime. Patient not taking: Reported on 03/14/2024 02/11/24   [name], MD  paliperidone  (INVEGA ) 6 MG 24 hr tablet Take 6 mg by mouth daily.    [provider]  traZODone  (DESYREL ) 150 MG tablet Take 1 tablet (150 mg total) by mouth at bedtime. 04/13/23   Rollene Katz, MD    Allergies: Wellbutrin  [bupropion ], Buspar  [buspirone ], Contrast media [iodinated contrast media], Omnipaque  [iohexol ], Penicillins, Atarax  [hydroxyzine ], and Cymbalta  [duloxetine  hcl]    Review of Systems  Updated Vital Signs There were no vitals taken for this visit.  Physical Exam Vitals and nursing note reviewed.  Constitutional:      General: She is not in acute distress.    Appearance: She is well-developed.  HENT:     Head: Normocephalic and atraumatic.     Right Ear: External ear normal.     Left Ear: External ear normal.     Nose: Nose normal.     Mouth/Throat:     Mouth: Mucous membranes are dry.     Pharynx: Oropharynx is clear.  Eyes:     Extraocular Movements: Extraocular movements intact.     Conjunctiva/sclera: Conjunctivae normal.  Pupils: Pupils are equal, round, and reactive to light.     Comments: Pupils 5 mm bilaterally  Cardiovascular:     Rate and Rhythm: Normal rate and regular rhythm.     Heart sounds: No murmur heard. Pulmonary:     Effort: Pulmonary effort is normal. No respiratory distress.     Breath sounds: Normal breath sounds.  Abdominal:     General: Abdomen is flat. There is no distension.     Palpations: Abdomen is soft. There is no mass.     Tenderness: There is no abdominal tenderness. There is no guarding.  Musculoskeletal:     Cervical back: Normal range of motion and neck supple.     Right lower leg:  No edema.     Left lower leg: No edema.     Comments: No muscle rigidity.  Reflexes 2+ bilaterally.  No ankle clonus   Skin:    General: Skin is warm and dry.  Neurological:     Mental Status: She is alert and oriented to person, place, and time. Mental status is at baseline.  Psychiatric:        Mood and Affect: Mood normal.     (all labs ordered are listed, but only abnormal results are displayed) Labs Reviewed - No data to display  EKG: None  Radiology: No results found.  {Document cardiac monitor, telemetry assessment procedure when appropriate:32947} Procedures   Medications Ordered in the ED - No data to display    {Click here for ABCD2, HEART and other calculators REFRESH Note before signing:1}                              Medical Decision Making Amount and/or Complexity of Data Reviewed Labs: ordered.   ***  {Document critical care time when appropriate  Document review of labs and clinical decision tools ie CHADS2VASC2, etc  Document your independent review of radiology images and any outside records  Document your discussion with family members, caretakers and with consultants  Document social determinants of health affecting pt's care  Document your decision making why or why not admission, treatments were needed:32947:::1}   Final diagnoses:  None    ED Discharge Orders     None        "
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
## 2024-05-20 NOTE — ED Triage Notes (Signed)
 Pt came in via EMS w/ c/o of feeling funny after cocaine and weed use. Stated never she mixed them before. States she feels weak and sleepy.

## 2024-05-20 NOTE — ED Triage Notes (Signed)
 Patient bib EMS from home after an intentional OD. She states she took 4mg  ativan , 1200mg  of gabapentin  and double of her morning meds. She does have a psych history.    A&Ox4 on arrival with GCS of 15.

## 2024-05-20 NOTE — ED Notes (Signed)
 Went into room to obtain urine sample. Pt stated they just went bathroom moments ago. This RN set out urine cup and stated she should try to provide sample when able too.

## 2024-05-20 NOTE — ED Notes (Signed)
 IVC paperwork in process, envelope [id number], waiting on findings and custody
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
## 2024-05-20 NOTE — ED Notes (Signed)
 IVC paperwork in process, envelope [id number]

## 2024-05-20 NOTE — ED Provider Notes (Signed)
 Provider Suicide Risk Assessment Note  Nursing Documentation C-SSRS RISK CATEGORY: High Risk  1:1 monitoring intiated  Suicide Risk Assessment:   Based on my clinical evaluation, I estimate the patient to be at acute high risk of self-harm in the current setting. This decision is based on my review of the chart including patient's history and current presentation, interview of the patient, mental status examination, and consideration of suicide risk including evaluating suicidal ideation, plan, intent, suicidal or self-harm behaviors, risk factors, and protective factors.  Patient has following modifiable risk factors for suicide: active suicidal ideation, under treated depression , social isolation, recklessness, medication noncompliance, lack of access to outpatient mental health resources, active mental illness (to encompass adhd, tbi, mania, psychosis, trauma reaction), and recent psychiatric hospitalization Patient has following non-modifiable or demographic risk factors for suicide:history of suicide attempt, history of self harm behavior, and psychiatric hospitalization Patient has the following protective factors against suicide: Access to outpatient mental health care Mitigation: Risk for suicide is being addressed by recommendations for: suicide precautions (moderate/high risk) and psychiatry consult as indicated (moderate/high risk)      Yolande Lamar BROCKS, MD 05/20/24 1451

## 2024-05-21 DIAGNOSIS — T1491XA Suicide attempt, initial encounter: Secondary | ICD-10-CM | POA: Diagnosis not present

## 2024-05-21 DIAGNOSIS — T426X2A Poisoning by other antiepileptic and sedative-hypnotic drugs, intentional self-harm, initial encounter: Secondary | ICD-10-CM | POA: Diagnosis not present

## 2024-05-21 DIAGNOSIS — F209 Schizophrenia, unspecified: Secondary | ICD-10-CM | POA: Diagnosis not present

## 2024-05-21 DIAGNOSIS — F200 Paranoid schizophrenia: Secondary | ICD-10-CM

## 2024-05-21 DIAGNOSIS — T424X2A Poisoning by benzodiazepines, intentional self-harm, initial encounter: Secondary | ICD-10-CM | POA: Diagnosis not present

## 2024-05-21 LAB — GLUCOSE, CAPILLARY
Glucose-Capillary: 131 mg/dL — ABNORMAL HIGH (ref 70–99)
Glucose-Capillary: 136 mg/dL — ABNORMAL HIGH (ref 70–99)
Glucose-Capillary: 138 mg/dL — ABNORMAL HIGH (ref 70–99)
Glucose-Capillary: 148 mg/dL — ABNORMAL HIGH (ref 70–99)
Glucose-Capillary: 150 mg/dL — ABNORMAL HIGH (ref 70–99)
Glucose-Capillary: 156 mg/dL — ABNORMAL HIGH (ref 70–99)
Glucose-Capillary: 161 mg/dL — ABNORMAL HIGH (ref 70–99)
Glucose-Capillary: 161 mg/dL — ABNORMAL HIGH (ref 70–99)
Glucose-Capillary: 162 mg/dL — ABNORMAL HIGH (ref 70–99)
Glucose-Capillary: 174 mg/dL — ABNORMAL HIGH (ref 70–99)
Glucose-Capillary: 178 mg/dL — ABNORMAL HIGH (ref 70–99)
Glucose-Capillary: 193 mg/dL — ABNORMAL HIGH (ref 70–99)
Glucose-Capillary: 196 mg/dL — ABNORMAL HIGH (ref 70–99)
Glucose-Capillary: 199 mg/dL — ABNORMAL HIGH (ref 70–99)
Glucose-Capillary: 221 mg/dL — ABNORMAL HIGH (ref 70–99)
Glucose-Capillary: 250 mg/dL — ABNORMAL HIGH (ref 70–99)

## 2024-05-21 LAB — URINE DRUG SCREEN
Amphetamines: NEGATIVE
Barbiturates: NEGATIVE
Benzodiazepines: POSITIVE — AB
Cocaine: POSITIVE — AB
Fentanyl: NEGATIVE
Methadone Scn, Ur: NEGATIVE
Opiates: NEGATIVE
Tetrahydrocannabinol: POSITIVE — AB

## 2024-05-21 LAB — ACETAMINOPHEN LEVEL: Acetaminophen (Tylenol), Serum: <10 ug/mL — ABNORMAL LOW (ref 10–30)

## 2024-05-21 MED ORDER — INSULIN ASPART 100 UNIT/ML IJ SOLN
0.0000 [IU] | Freq: Three times a day (TID) | INTRAMUSCULAR | Status: DC
  Administered 2024-05-22 – 2024-05-23 (×5): 4 [IU] via SUBCUTANEOUS
  Administered 2024-05-23: 3 [IU] via SUBCUTANEOUS
  Administered 2024-05-24 (×2): 4 [IU] via SUBCUTANEOUS
  Filled 2024-05-21 (×3): qty 4
  Filled 2024-05-21: qty 3
  Filled 2024-05-21 (×4): qty 4

## 2024-05-21 NOTE — Discharge Instructions (Signed)
 "  [employer] [facility]elessness Coordinated Entry Program. If you are experiencing homelessness in [place], [place] , your first point of contact should be Pensions Consultant. You can reach Coordinated Entry by calling [phone] or by emailing [email or web address] [email or web address].  Community access points: [employer] [phone] [address], [place]) every Tuesday from 9am-10am. [facility] ([address]. [address] [place]) every Wednesday from 8am-9am.   -The [employer] 867-856-2724) offers several services to local families, as funding allows. The Emergency Assistance Program (EAP), which they administer, provides household goods, free food, clothing, and financial aid to people in need in the [place]  area. The EAP program does have some qualification, and counselors will interview clients for financial assistance by written referral only. Referrals need to be made by the Department of Social Services or by other EAP approved human services agencies or charities in the area.  -[facility] of [employer], which can be reached at [phone], offers emergency assistance programs for those in need of help, such as food, rent assistance, a soup kitchen, shelter, and clothing. They are based in [facility] [place]  but provide a number of services to those that qualify for assistance.   [facility] Department of Social Services may be able to offer temporary financial assistance and cash grants for paying rent and utilities, Help may be provided for local county residents who may be experiencing personal crisis when other resources, including government programs, are not available. Call [phone]  -[facility] [employer] is a [employer] agency, The organization can offer emergency assistance for paying rent, caremark rx, utilities, food,  household products and furniture. They offer extensive emergency and transitional housing for families, children and single women, and also run a Boy's and [employer]. Thrift Shops, Secondary School Teacher, and other aid offered too. [address] [place], [place]  [address], [phone]  -[place] Low Income Energy Assistance Program -- This is offered for [facility] families. The federal government created [employer] Program provides a one-time cash grant payment to help eligible low-income families pay their electric and heating bills. [address] [place], [place]  [address], [phone]  -[place] Emergency Assistance -- A program offers emergency utility and rent funds for greater [place] area residents. The program can also provide counseling and referrals to charities and government programs. Also provides food and a free meal program that serves lunch Mondays - Saturdays and dinner seven days per week to individuals in the community. [address], [place], [place]  [address], [phone]  -[employer] - Offers affordable apartment and housing communities across      [place] and [place]. The low income and seniors can access public housing, rental assistance to qualified applicants, and apply for the section 8 rent subsidy program. Other programs include Chiropractor and Engineer, Maintenance. [address] [place], [place]  [address], dial [phone].  -The [facility] provides transitional housing to veterans and the disabled. Clients will also access other services too, including assistance in applying for Disability, life skills classes, case management, and assistance in finding permanent housing. [address] [place], [place]  [address], call [phone]  -[facility] Transitional Housing through [employer] is for  people who were just evicted or that are formerly homeless. The non-profit will also help then gain self-sufficiency, find a home or apartment  to live in, and also provides information on rent assistance when needed. Phone [phone]  -The [facility] helps low income, elderly, or disabled residents in seven counties in the [place] ([place], [place], [place], [place], [place], [place], [place], and [place]) save energy and reduce their utility bills by improving energy efficiency. Phone [phone].  -[employer] is located in the [place] [facility] in the [employer], [address], [place], [place] [address]. Parking is in the rear of the building. Phone: [phone]   General Email: info@gsohc .org  [facility] provides free housing counseling assistance in locating affordable rental housing or housing with support services for families and individuals in crisis and the chronically homeless. We provide potential resources for other housing needs like utilities. Our trained counselors also work with clients on budgeting and financial literacy in effort to empower them to take control of their financial situations. [employer] collaborates with homeless service providers and other stakeholders as part of the [employer] COC (Continuum of Care). The (COC) is a regional/local planning body that coordinates housing and services funding for homeless families and individuals. The role of [facility] in the COC is through housing counseling to work with people we serve on diversion strategies for those that are at imminent risk of becoming homeless. We also work with the Coordinated Assessment/Entry Specialist who attempts to find temporary solutions and/or connects the people to Housing First, Rapid Re-housing or transitional housing programs. Our Homelessness Prevention Housing Counselors meet  with clients on business days (Monday-Fridays, except scheduled holidays) from 8:30 am to 4:30 pm.  Legal assistance for evictions, foreclosure, and more -If you need free legal advice on civil issues, such as foreclosures, evictions, electronics engineer, government programs, domestic issues and more, Armed Forces Operational Officer Aid of [place]  [facility]) is a associate professor firm that provides free legal services and counsel to lower income people, seniors, disabled, and others, The goal is to ensure everyone has access to justice and fair representation. Call them at [phone].  [facility] for Housing and Community Studies can provide info about obtaining legal assistance with evictions. Phone [phone]. Data Processing Manager  The [employer]. offers job and dispensing optician. Resources are focused on helping students obtain the skills and experiences that are necessary to compete in today's challenging and tight job market. The non-profit faith-based community action agency offers internship trainings as well as classroom instruction. Classes are tailored to meet the needs of people in the [facility] region. [place], [place] [address], [phone] Foreclosure Prevention/Debt Services Family Services of the [employer] Credit Counseling Service inludes debt and foreclosure prevention programs for local families. This includes money management, financial advice, budget review and development of a written action plan with a pensions consultant to help solve specific individual financial problems. In addition, housing and mortgage counselors can also provide pre- and post-purchase homeownership counseling, default resolution counseling (to prevent foreclosure) and reverse mortgage counseling. A Debt Management Program allows people and families with a high level of credit card or medical debt to consolidate and repay consumer debt and loans to  creditors and rebuild positive credit ratings and scores. Contact ([phone]) D7650557. Community Clinics in [place] -Health Department [facility]: [address] [place], [place], [address]. [phone].  -Health Department [place] [facility]: [address], [place], [address]. [phone].  -[facility] offers medical care through a group of doctors, pharmacies  and other healthcare related agencies that offer services for low income, uninsured adults in [place]. Also offers adult Dental care and assistance with applying for an [employer]. Call [phone].   [facility]. This center provides low-cost health care to those without health insurance. Services offered include an onsite pharmacy. Phone [phone]. [address], [place].  -Medication Assistance Program serves as a link between pharmaceutical companies and patients to provide low cost or free prescription medications. This service is available for residents who meet certain income restrictions and have no insurance coverage. PLEASE CALL [phone] [place]) OR ([phone] ([place])  -One Step Further: Materials Engineer, The [employer] Support & Nutrition Program, [place]. Call [phone]/ [phone].  Food Emergency Planning/management Officer -[facility]-Food Bank: [address], [place] [address]. Phone [phone]  -[facility]: [address], [place], [place] [address]. [phone].  -[place] Food Finder: https://findfood.bargaincontractor.si  FLEEING VIOLENCE  -Family Services of the [place]- 24/7 Crisis line [phone]) -[facility] Family Justice Centers: (430) [phone]) 641-SAFE (260)[phone])  Transportation  -[facility] for an application. No fee for people over the age of 36. P: [phone]. Address: [address], [place], [place] [address]  -Senior Wheels Transportation-Age 88 and over. Limit of 1 ride per week for ambulatory participants. Limit 1 ride per month for non-ambulatory participants needing wheelchair transportation. Contact: ([phone]) [phone] ([place]), [phone] [facility]).  -Access [place]: Available for people with disabilities who are unable to use fixed-route bus service. Fee applies. Contact: [phone] (For application requests)/(336) [phone] (Customer service)/(336) [phone] (I-ride reservation line)/(336) [phone] (Access [place] reservation line)   Building Services Engineer.org  Homeless Day Center  -[facility])   Day Center: M-F 8a-3p [address]  [place], [place] [address] [phone] Services include: laundry, barbering, support groups, case management, phone & computer access, showers, AA/NA mtgs, mental health/substance abuse nurse, job skills class, disability information, VA assistance, spiritual classes, etc.        HOMELESS SHELTERS [employer] Shelter at [employer]- Call [phone] ext. 347 or ext. 336. Located at [address], [place], [place] [address]  [facility] Mens Shelter- Call [phone]. Located at [address] [place] [address] [place] [address].  [facility]- [employer]. Call [phone]. Office located at [address], [place] [address].  [facility] Family Housing through [place] [phone].  [facility]- Call [phone]. Located at [address] [place], [place], [place] [address].  [facility] at the Inn-For Pregnant mothers. Call [phone]. Located at [address] [place], [address].  [facility]-For men in [place]. Call [phone].  Home of [employer] for [employer] [phone]. Office located at [address] [place], [address].  [employer] be agreeable to help with chores. Call [phone] ext. [phone].  Men's: [address] [place], [place] [address]. Women's: [facility]  [address] [place], [place] [address]   Crisis Services [facility] Mobile Crisis Management- [phone]  [facility] [address] [place], [place] [address]. Phone: [phone]   *[place] 2-1-1 is another useful way to locate resources in the community. Visit shedsizes.ch to find service information online. If you need additional assistance, 2-1-1 Referral Specialists are available 24 hours a day, every day by dialing 2-1-1 or  [phone] from any phone. The call is free, confidential, and available in any language. "

## 2024-05-21 NOTE — Progress Notes (Signed)
 "  HD#0 SUBJECTIVE:  Patient Summary: [name] is a 34 y.o. female with a history of DMII, HLD, HTN, learning disability, Bipolar I, Schizophrenia, MDD/GAD, polysubstance use (marijuana, cocaine), with prior suicide attempts, who presented after suicide attempt with multiple medications, who is IVC'd and admitted for Q1 hr glucose monitoring, and awaiting Psychiatric evaluation.   Overnight Events:  None  Interim History:  Patient endorsing that she is still very sleep. However is appropriately answering questions and responding to commands. She denies any pain anywhere. Has been eating, drinking, and urinating well. 1:1 sitter at bedside. When asked if she has any of her home medications with her, she states yes, but the nurse has them.   OBJECTIVE:  Vital Signs: Vitals:   05/20/24 1447 05/20/24 1737 05/20/24 2007 05/20/24 2324  BP:  129/78 102/68 123/79  Pulse:  79 73 78  Resp:  18 19 20   Temp:  98.6 F (37 C) 97.6 F (36.4 C) 97.6 F (36.4 C)  TempSrc:  Oral Axillary Oral  SpO2:  100% 96% 98%  Weight: 125 kg  123.4 kg   Height: 5' 7 (1.702 m)      Supplemental O2: Room Air SpO2: 98 %  Filed Weights   05/20/24 1447 05/20/24 2007  Weight: 125 kg 123.4 kg     Intake/Output Summary (Last 24 hours) at 05/21/2024 0913 Last data filed at 05/21/2024 0730 Gross per 24 hour  Intake 120 ml  Output 450 ml  Net -330 ml   Net IO Since Admission: -330 mL [05/21/24 0913]  Physical Exam:  Constitutional: tired-appearing female lying in hospital bed, in no acute distress.  HEENT: normocephalic atraumatic, mucous membranes moist Cardiovascular: regular rate and rhythm, bilateral radial pulses 2+, bilateral dorsal pedal pulses 2+, brisk capillary refill bilateral feet and hands  Pulmonary/Chest: normal work of breathing on room air, lungs clear to auscultation bilaterally Abdominal: soft, non-tender, non-distended MSK: normal bulk and tone. Neurological: alert & oriented  x 3 Skin: warm and dry Psych: mood calm, behavior normal, thought content normal, judgement normal    Patient Lines/Drains/Airways Status     Active Line/Drains/Airways     Name Placement date Placement time Site Days   Peripheral IV 05/20/24 20 G Anterior;Left Forearm 05/20/24  --  Forearm  1            Pertinent labs and imaging:      Latest Ref Rng & Units 05/20/2024    3:16 PM 05/20/2024    2:54 PM 05/02/2024    4:42 PM  CBC  WBC 4.0 - 10.5 K/uL  7.8  10.7   Hemoglobin 12.0 - 15.0 g/dL 87.3  87.8  87.3   Hematocrit 36.0 - 46.0 % 37.0  37.1  39.3   Platelets 150 - 400 K/uL  220  238        Latest Ref Rng & Units 05/20/2024    3:16 PM 05/20/2024    2:54 PM 05/02/2024    4:42 PM  CMP  Glucose 70 - 99 mg/dL 842  838  880   BUN 6 - 20 mg/dL 11  11  9    Creatinine 0.44 - 1.00 mg/dL 9.29  9.23  9.29   Sodium 135 - 145 mmol/L 142  140  138   Potassium 3.5 - 5.1 mmol/L 3.6  4.0  3.6   Chloride 98 - 111 mmol/L 102  103  101   CO2 22 - 32 mmol/L  29  27  Calcium  8.9 - 10.3 mg/dL  9.3  9.8   Total Protein 6.5 - 8.1 g/dL  7.1  7.5   Total Bilirubin 0.0 - 1.2 mg/dL  0.4  0.4   Alkaline Phos 38 - 126 U/L  50  57   AST 15 - 41 U/L  27  28   ALT 0 - 44 U/L  27  27     No results found.  ASSESSMENT/PLAN:  Assessment: Principal Problem:   Suicide attempt by multiple drug overdose (HCC)  [name] is a 34 y.o. female with a history of DMII, HLD, HTN, learning disability, Bipolar I, Schizophrenia, MDD/GAD, polysubstance use (marijuana, cocaine), with prior suicide attempts, who presented after suicide attempt with multiple medications, who is IVC'd and admitted for Q1 hr glucose monitoring, and awaiting Psychiatric evaluation.   Plan: #Suicide Attempt #MDD/GAD #IVCd (05/20/2024) #Schizophrenia #Bipolar I Patient has an extensive psychiatric history with multiple suicide attempts in the past. Vital signs have been stable.  Laboratory workup reassuringly within normal  limits.  ED MD called poison control who recommended Q1 hour blood glucose checks in the setting of taking 2 doses of her glipizide  10 mg for a total of 20 mg today. Currently IVCd which began 05/20/2024.  Plan for psychiatry consult with likely discharge to [facility] following 24-hour observation of blood sugars. Discussed case with on-call Psych ED provider who recommended placing a Psychiatry consult order. Thus far blood sugars remain elevated without any episodes of hypoglycemia. Awaiting Psych consult. Of note, AM UDS positive for cocaine, THC and benzos, though admission UDS positive for THC and cocaine only. Patient admits home medications were with her but had since been confiscated by [employer]. Suspect she took her home medications, will confirm with RN that home meds have been confiscated.  - IVC'd: 05/20/2024 - 1:1 sitter  - Psychiatry consulted, awaiting recs - Q1 hour CBG checks until 1500 on 1/25  - D50 prn for low blood glucose  - Hold home Fluvoxamine  50 mg nightly - Hold home Ativan  0.5 mg BID prn - Hold home Paliperidone  6 mg daily  - Hold home Trazodone  150 mg nightly  - Hold home SQ Invega  sustenna injection, unsure when last received    #DMII 03/13/24 A1c of 12.1. Home diabetes medication regimen by chart review appears to be Insulin  aspart protamine (70-30) 40 units daily, and Glipizide  10 mg daily. She also takes Gabapentin  300 mg TID. Endorsed taking double of her oral medication dose including glipizide . Poison control recommended Q1 hr blood glucose checks for 24 hours. Given her significant history of suicide attempts, would not recommend sulfonylurea for further management of her diabetes. SSI while admitted if blood glucose trend high consistently.  - CBG checks q1 hr until 1500 on 1/25  - Hold home Gabapentin  300 mg TID - Hold home Glipizide  10 mg daily, would consider discontinuing this and alternative for glycemic control  - Hold home Insulin  aspart protamine 70-30, 40 units     #HTN History of hypertension controlled with Amlodipine  5 mg daily.  There also appears to be a clonidine  0.1 mg nightly prescription on her historical medications list, though without any prior fill history, and patient previously reported that she was not taking this. She has been normotensive so far. Will hold Amlodipine  for now and resume if persistently elevated BP.  - Hold home Amlodipine  5 mg daily  - Monitor for signs of rebound HTN  Best Practice: Diet: Regular diet VTE: enoxaparin  (LOVENOX ) injection 40  mg Start: 05/20/24 1800 Code: Full  Disposition planning: DISPO: Anticipated discharge today to [facility] pending Q1 glucose checks which end at 1300 today, and Psych evaluation .  Signature:  Doyal Miyamoto, MD [facility]  9:13 AM, 05/21/2024  On Call pager [phone]  "

## 2024-05-21 NOTE — Care Management (Signed)
 SDOH resources added to AVS
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
## 2024-05-22 DIAGNOSIS — F411 Generalized anxiety disorder: Secondary | ICD-10-CM | POA: Diagnosis not present

## 2024-05-22 DIAGNOSIS — Z79899 Other long term (current) drug therapy: Secondary | ICD-10-CM

## 2024-05-22 DIAGNOSIS — I459 Conduction disorder, unspecified: Secondary | ICD-10-CM | POA: Diagnosis not present

## 2024-05-22 DIAGNOSIS — F329 Major depressive disorder, single episode, unspecified: Secondary | ICD-10-CM

## 2024-05-22 DIAGNOSIS — E785 Hyperlipidemia, unspecified: Secondary | ICD-10-CM

## 2024-05-22 DIAGNOSIS — Z7984 Long term (current) use of oral hypoglycemic drugs: Secondary | ICD-10-CM | POA: Diagnosis not present

## 2024-05-22 DIAGNOSIS — F209 Schizophrenia, unspecified: Secondary | ICD-10-CM | POA: Diagnosis not present

## 2024-05-22 DIAGNOSIS — E119 Type 2 diabetes mellitus without complications: Secondary | ICD-10-CM | POA: Diagnosis not present

## 2024-05-22 DIAGNOSIS — T50912D Poisoning by multiple unspecified drugs, medicaments and biological substances, intentional self-harm, subsequent encounter: Secondary | ICD-10-CM | POA: Diagnosis not present

## 2024-05-22 DIAGNOSIS — I10 Essential (primary) hypertension: Secondary | ICD-10-CM | POA: Diagnosis not present

## 2024-05-22 LAB — GLUCOSE, CAPILLARY
Glucose-Capillary: 170 mg/dL — ABNORMAL HIGH (ref 70–99)
Glucose-Capillary: 172 mg/dL — ABNORMAL HIGH (ref 70–99)
Glucose-Capillary: 180 mg/dL — ABNORMAL HIGH (ref 70–99)
Glucose-Capillary: 182 mg/dL — ABNORMAL HIGH (ref 70–99)
Glucose-Capillary: 157 mg/dL — ABNORMAL HIGH (ref 70–99)

## 2024-05-22 LAB — URINALYSIS, ROUTINE W REFLEX MICROSCOPIC
Bacteria, UA: NONE SEEN
Bilirubin Urine: NEGATIVE
Glucose, UA: >=500 mg/dL — AB
Hgb urine dipstick: NEGATIVE
Ketones, ur: NEGATIVE mg/dL
Leukocytes,Ua: NEGATIVE
Nitrite: NEGATIVE
Protein, ur: NEGATIVE mg/dL
Specific Gravity, Urine: 1.017 (ref 1.005–1.030)
pH: 6 (ref 5.0–8.0)

## 2024-05-22 MED ORDER — LORAZEPAM 1 MG PO TABS
1.0000 mg | ORAL_TABLET | Freq: Two times a day (BID) | ORAL | Status: DC | PRN
  Administered 2024-05-22 – 2024-05-24 (×3): 1 mg via ORAL
  Filled 2024-05-22 (×3): qty 1

## 2024-05-22 MED ORDER — PALIPERIDONE ER 3 MG PO TB24
3.0000 mg | ORAL_TABLET | Freq: Every day | ORAL | Status: DC
  Administered 2024-05-22 – 2024-05-24 (×3): 3 mg via ORAL
  Filled 2024-05-22 (×3): qty 1

## 2024-05-22 MED ORDER — FLUVOXAMINE MALEATE 50 MG PO TABS
50.0000 mg | ORAL_TABLET | Freq: Every day | ORAL | Status: DC
  Administered 2024-05-22 – 2024-05-24 (×2): 50 mg via ORAL
  Filled 2024-05-22 (×2): qty 1

## 2024-05-22 NOTE — Discharge Summary (Signed)
 "  Name: [name] MRN: [id number] DOB: [birth year] 34 y.o. PCP: Stacy Rosina SAILOR, PA  Date of Admission: 05/20/2024  2:34 PM Date of Discharge: 05/24/2024 Attending Physician: Dr. Reyes [name]  Discharge Diagnosis: 1. Principal Problem:   Suicide attempt by multiple drug overdose [facility]) Active Problems:   Bipolar I disorder with mixed features [facility])   Discharge Medications: Allergies as of 05/24/2024       Reactions   Wellbutrin  [bupropion ] Shortness Of Breath, Swelling, Other (See Comments)   Acting up   Buspar  [buspirone ] Itching   Contrast Media [iodinated Contrast Media] Swelling, Other (See Comments)   Eyes swell up   Omnipaque  [iohexol ] Swelling, Other (See Comments)   Eye swelling   Penicillins Hives   Atarax  [hydroxyzine ] Other (See Comments)   Causes hyperactivity, makes pt want to fight   Cymbalta  [duloxetine  Hcl] Other (See Comments)   No appetite and makes the patient act up        Medication List     PAUSE taking these medications    gabapentin  300 MG capsule Wait to take this until your doctor or other care provider tells you to start again. Commonly known as: Neurontin  Take 1 capsule (300 mg total) by mouth 3 (three) times daily.   glipiZIDE  10 MG 24 hr tablet Wait to take this until your doctor or other care provider tells you to start again. Commonly known as: GLUCOTROL  XL Take 10 mg by mouth daily with breakfast.   traZODone  150 MG tablet Wait to take this until your doctor or other care provider tells you to start again. Commonly known as: DESYREL  Take 1 tablet (150 mg total) by mouth at bedtime.       TAKE these medications    amLODipine  5 MG tablet Commonly known as: NORVASC  Take 5 mg by mouth daily.   fluvoxaMINE  50 MG tablet Commonly known as: LUVOX  Take 1 tablet (50 mg total) by mouth at bedtime.   insulin  aspart protamine- aspart (70-30) 100 UNIT/ML injection Commonly known as: NOVOLOG  MIX 70/30 Inject 50  Units into the skin every evening.   Invega  Sustenna 156 MG/ML Susy injection Generic drug: paliperidone  Inject 156 mg into the muscle every 28 (twenty-eight) days.   LORazepam  0.5 MG tablet Commonly known as: ATIVAN  Take 0.5 mg by mouth 2 (two) times daily as needed for anxiety.   paliperidone  6 MG 24 hr tablet Commonly known as: INVEGA  Take 6 mg by mouth daily.   simvastatin  40 MG tablet Commonly known as: ZOCOR  Take 40 mg by mouth daily.        Disposition and follow-up:   Ms.[name] was discharged from [facility] in Good condition.  At the hospital follow up visit please address:  1.  Diabetes: Patient with a history of multiple suicide attempts. She is taking a sulfonylurea (Glipizde) and insulin , though both of these are high risk for hypoglycemia and may not be a good option for her. Would consider switching to GLP-1 that might be able to be administered weekly by her ACT team, and would also be beneficial for her obesity.  - Resumed home insulin  on discharge  - Hold Glipizide  until follow up with PCP  - Consider GLP-1 if able  2.  Labs / imaging needed at time of follow-up: None  3.  Pending labs/ test needing follow-up: None  Follow-up Appointments:  Follow-up Information     [name], [place]. Call.   Specialty: Physician Assistant Why:  Please schedule an appointment with your PCP within 7-14 days after discharge from the hospital. Contact information: [address] [place] [address] [phone]                  Hospital Course by problem list: [name] is a 34 y.o. 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 Followed by ACT team who last saw her on Wednesday 1/21. Around 1200 on 1/24 she reports taking 4 tabs of gabapentin , 4 tabs of Ativan , and her daily morning medications which she could not name.  Then around 1300 she took another set of her daily morning medications. Presented to ED following suicide attempt and IVC'd on 1/24 with 1:1 sitter at bedside during hospitalization. Vital signs remained stable.  Laboratory workup reassuringly within normal limits, with glucose ~160 on admission.  ED MD called poison control who recommended Q1 hour blood glucose checks in the setting of taking 2 doses of her glipizide  10 mg for a total of 20 mg today. Completed monitoring of Q1 blood sugars for 24 hours without any hypoglycemic events. Held all home medications in the setting of suicide attempt with medications. Psychiatry was consulted who agreed with transfer to [facility] once medically cleared. She was medically cleared from Medicine standpoint since 1/25 @ 1500. Resumed home Psych medications per Psych.  - IVC'd: 05/20/2024 - Resumed home Fluvoxamine  50 mg nightly - Increased Ativan  1 mg BID prn - Resumed home Paliperidone  3 mg daily  - Hold home Trazodone  150 mg nightly  - Hold home SQ Invega  sustenna injection, unsure when last received    #DMII 03/13/24 A1c of 12.1. Home diabetes medication regimen by chart review appears to be Insulin  aspart protamine (70-30) 40 units daily, and Glipizide  10 mg daily. She also takes Gabapentin  300 mg TID. Given her significant history of suicide attempts, would not recommend sulfonylurea for further management of her diabetes. Could consider GLP-1 that may be able to be given by ACT team weekly. Initiated SSI once Q1 blood glucose checks for hypoglycemia was  completed. She did not require any short-acting insulin  during admission.  - Hold home Gabapentin  300 mg TID, resume per Psych - Hold home Glipizide  10 mg daily, would consider discontinuing this and alternative for glycemic control  - Could consider GLP-1 that may be able to be given by ACT team weekly.  - Resume home Insulin  aspart protamine 70-30, 40 units    #HTN History of hypertension controlled with Amlodipine  5 mg daily.  There also appears to be a clonidine  0.1 mg nightly prescription on her historical medications list, though without any prior fill history, and patient previously reported that she was not taking this. She remained normotensive during hospitalization. Held home Amlodipine  and monitored for signs of rebound HTN.  - Continue home Amlodipine  5 mg daily   Subjective Patient feeling well this morning. She is chipper and smiling, reported she is feeling well this morning. She is eating and drinking well, and has been urinating without issue. She had a BM this morning which was normal in quality  per her report.   Discharge Exam:   BP (!) 141/83 (BP Location: Right Arm)   Pulse 82   Temp 98.2 F (36.8 C) (Oral)   Resp 18   Ht 5' 7 (1.702 m)   Wt 123.2 kg   SpO2 90%   BMI 42.54 kg/m   Physical Exam:   Constitutional: obese and well-appearing female sitting up in hospital bed, in no acute distress.  HEENT: normocephalic atraumatic, mucous membranes moist Cardiovascular: regular rate and rhythm, bilateral radial pulses 2+, bilateral dorsal pedal pulses 2+, brisk capillary refill bilateral feet and hands, no LE edema  Pulmonary/Chest: normal work of breathing on room air, lungs clear to auscultation bilaterally Abdominal: soft, non-tender, non-distended MSK: normal bulk and tone. Neurological: alert & oriented x 3 Skin: warm and dry Psych: mood calm, behavior normal, thought content normal, judgement normal    Pertinent Labs, Studies, and Procedures:     Latest  Ref Rng & Units 05/20/2024    3:16 PM 05/20/2024    2:54 PM 05/02/2024    4:42 PM  CBC  WBC 4.0 - 10.5 K/uL  7.8  10.7   Hemoglobin 12.0 - 15.0 g/dL 87.3  87.8  87.3   Hematocrit 36.0 - 46.0 % 37.0  37.1  39.3   Platelets 150 - 400 K/uL  220  238        Latest Ref Rng & Units 05/20/2024    3:16 PM 05/20/2024    2:54 PM 05/02/2024    4:42 PM  CMP  Glucose 70 - 99 mg/dL 842  838  880   BUN 6 - 20 mg/dL 11  11  9    Creatinine 0.44 - 1.00 mg/dL 9.29  9.23  9.29   Sodium 135 - 145 mmol/L 142  140  138   Potassium 3.5 - 5.1 mmol/L 3.6  4.0  3.6   Chloride 98 - 111 mmol/L 102  103  101   CO2 22 - 32 mmol/L  29  27   Calcium  8.9 - 10.3 mg/dL  9.3  9.8   Total Protein 6.5 - 8.1 g/dL  7.1  7.5   Total Bilirubin 0.0 - 1.2 mg/dL  0.4  0.4   Alkaline Phos 38 - 126 U/L  50  57   AST 15 - 41 U/L  27  28   ALT 0 - 44 U/L  27  27     No results found.   Discharge Instructions: Discharge Instructions     Increase activity slowly   Complete by: As directed          Discharge Instructions      To [name] or their caretakers,  They were admitted to [facility] on 05/20/2024 for evaluation and treatment of: suicide attempt with home medication overdose      The evaluation suggested continued worsening of depression and mood. They were treated with close monitoring.  They were discharged from the hospital on 05/24/24. I recommend the following after leaving the hospital:   Medications Adjusted During Hospital Admission: Please see your medication discharge instructions for how to take your meds appropriately once leaving the hospital. For your diabetes, it would be helpful to discuss initiating a GLP-1 medication as this may help with both diabetes and weight loss.    Please follow up with your PCP once you've been discharged from the hospital:   Follow-up Information     Stacy Rosina SAILOR, PA. Call.  Specialty: Physician Assistant Why: Please schedule  an appointment with your PCP within 7-14 days after discharge from the hospital. Contact information: [address] [place] [address] [phone]                   For questions about your care plan, until you are able to see your primary doctor: Call [phone]. Dial [phone] for the operator. Ask for the internal medicine resident on call.  Thank you for allowing us  to be part of your care.   Doyal Miyamoto, MD 05/24/2024, 9:27 AM     [employer] -[facility]elessness Coordinated Entry Program. If you are experiencing homelessness in [place], [place] , your first point of contact should be Pensions Consultant. You can reach Coordinated Entry by calling (336) 5096206031 or by emailing [email or web address] [email or web address].  Community access points: [employer] [phone] [address], [place]) every Tuesday from 9am-10am. [facility] ([address], [place]) every Wednesday from 8am-9am.   -The [employer] 954-002-2772) offers several services to local families, as funding allows. The Emergency Assistance Program (EAP), which they administer, provides household goods, free food, clothing, and financial aid to people in need in the [place]  area. The EAP program does have some qualification, and counselors will interview clients for financial assistance by written referral only. Referrals need to be made by the Department of Social Services or by other EAP approved human services agencies or charities in the area.  -[facility] of [employer], which can be reached at [phone], offers emergency assistance programs for those in need of help, such as food, rent assistance, a soup kitchen, shelter, and clothing. They are based in [facility] [place]  but provide a number of services to those that qualify for assistance.   [facility]  Department of Social Services may be able to offer temporary financial assistance and cash grants for paying rent and utilities, Help may be provided for local county residents who may be experiencing personal crisis when other resources, including government programs, are not available. Call [phone]  -High [employer] is a [employer] agency, The organization can offer emergency assistance for paying rent, caremark rx, utilities, food, household products and furniture. They offer extensive emergency and transitional housing for families, children and single women, and also run a Boy's and [employer]. Thrift Shops, Secondary School Teacher, and other aid offered too. [address] [place], [place]  [address], [phone]  -[place] Low Income Energy Assistance Program -- This is offered for [facility] families. The federal government created [employer] Program provides a one-time cash grant payment to help eligible low-income families pay their electric and heating bills. [address], [place], [place]  [address], [phone]  -[place] Emergency Assistance -- A program offers emergency utility and rent funds for greater [place] area residents. The program can also provide counseling and referrals to charities and government programs. Also provides food and a free meal program that serves lunch Mondays - Saturdays and dinner seven days per week to individuals in the community. [address], [place], [place]  [address], [phone]  -[employer] - Offers affordable apartment and housing communities across      [place] and [place]. The low income and seniors can access public housing, rental assistance to qualified applicants, and apply for the section 8 rent subsidy program.  Other programs include Research Officer, [employer]. [address]  [place], [place]  [address], dial [phone].  -The [facility] provides transitional housing to veterans and the disabled. Clients will also access other services too, including assistance in applying for Disability, life skills classes, case management, and assistance in finding permanent housing. [address], [place], [place]  [address], call [phone]  -[place] Transitional Housing through [employer] is for people who were just evicted or that are formerly homeless. The non-profit will also help then gain self-sufficiency, find a home or apartment to live in, and also provides information on rent assistance when needed. Phone ([phone]  -The [place] [facility] helps low income, elderly, or disabled residents in seven counties in the [place] ([place], [place], [place], [place], [place], [place], [place], and [place]) save energy and reduce their utility bills by improving energy efficiency. Phone [phone].  -[employer] is located in the [place] [facility] in the [employer], [address], [address], [place], [place] [address]. Parking is in the rear of the building. Phone: [phone]   General Email: Francois ann  GHC provides free housing counseling assistance in locating affordable rental housing or housing with support services for families and individuals in crisis and the chronically homeless. We provide potential resources for other housing needs like utilities. Our trained counselors also work with clients on budgeting and financial literacy in effort to empower them to take control of their financial situations. [employer] collaborates with homeless service providers and other stakeholders as part of the [employer] COC (Continuum of Care). The (COC) is a regional/local planning body that coordinates housing  and services funding for homeless families and individuals. The role of [facility] in the COC is through housing counseling to work with people we serve on diversion strategies for those that are at imminent risk of becoming homeless. We also work with the Coordinated Assessment/Entry Specialist who attempts to find temporary solutions and/or connects the people to Housing First, Rapid Re-housing or transitional housing programs. Our Homelessness Prevention Housing Counselors meet with clients on business days (Monday-Fridays, except scheduled holidays) from 8:30 am to 4:30 pm.  Legal assistance for evictions, foreclosure, and more -If you need free legal advice on civil issues, such as foreclosures, evictions, electronics engineer, government programs, domestic issues and more, Armed Forces Operational Officer Aid of [place]  [facility]) is a associate professor firm that provides free legal services and counsel to lower income people, seniors, disabled, and others, The goal is to ensure everyone has access to justice and fair representation. Call them at [phone].  [facility] for Housing and Community Studies can provide info about obtaining legal assistance with evictions. Phone [phone]. Data Processing Manager  The [employer], [employer]. offers job and dispensing optician. Resources are focused on helping students obtain the skills and experiences that are necessary to compete in today's challenging and tight job market. The non-profit faith-based community action agency offers internship trainings as well as classroom instruction. Classes are tailored to meet the needs of people in the [facility] region. [place], [place] [address], [phone] Foreclosure Prevention/Debt Services Family Services of the [employer] Credit Counseling Service inludes debt and foreclosure prevention programs for local families. This includes money management, financial advice, budget review and development of a written action  plan with a pensions consultant to help solve specific individual financial problems. In addition, housing and mortgage counselors can  also provide pre- and post-purchase homeownership counseling, default resolution counseling (to prevent foreclosure) and reverse mortgage counseling. A Debt Management Program allows people and families with a high level of credit card or medical debt to consolidate and repay consumer debt and loans to creditors and rebuild positive credit ratings and scores. Contact (336) F1555895. Community Clinics in [place] -Health Department [facility]: [address] [place], [place], [address]. [phone].  -Health Department [place] [facility]: [address], [facility], [address]. [phone].  -[facility] offers medical care through a group of doctors, pharmacies and other healthcare related agencies that offer services for low income, uninsured adults in [place]. Also offers adult Dental care and assistance with applying for an [employer]. Call [phone].   [facility]. This center provides low-cost health care to those without health insurance. Services offered include an onsite pharmacy. Phone [phone]. [address], [place].  -Medication Assistance Program serves as a link between pharmaceutical companies and patients to provide low cost or free prescription medications. This service is available for residents who meet certain income restrictions and have no insurance coverage. PLEASE CALL [phone] [place]) OR ([phone] ([place])  -One Step Further: Materials Engineer, The [employer] Support & Nutrition Program, [place]. Call [phone]/ [phone].  Food Emergency Planning/management Officer -Urban Ministry-Food Bank: [address],  [address]. Phone [phone]  -[facility] Food Pantry: [address] [place], [place] [address]. [phone].  -[place] Food Finder: https://findfood.bargaincontractor.si  FLEEING VIOLENCE  -Family Services of the [place]- 24/7 Crisis line 239-617-4100) -[facility] [place] Family Justice Centers: 8653235601) 641-SAFE (740) [phone])  Transportation  -[facility] for an application. No fee for people over the age of 81. P: ([phone]. Address: [address] [place], [place] [address]  -Senior Wheels Transportation-Age 75 and over. Limit of 1 ride per week for ambulatory participants. Limit 1 ride per month for non-ambulatory participants needing wheelchair transportation. Contact: (336) ([phone] ([place]), [phone] [facility]).  -Access [place]: Available for people with disabilities who are unable to use fixed-route bus service. Fee applies. Contact: [phone] (For application requests)/(336) [phone] (Customer service)/(336) [phone] (I-ride reservation line)/(336) ([phone] (Access gso reservation line)   Building Services Engineer.org  Homeless Day Center  -[facility])   Day Center: M-F 8a-3p [address]  [place], [place] [address] [phone] Services include: laundry, barbering, support groups, case management, phone & computer access, showers, AA/NA mtgs, mental health/substance abuse nurse, job skills class, disability information, VA assistance, spiritual classes, etc.        HOMELESS SHELTERS [employer] Shelter at [employer]- Call [phone] ext. 347 or ext. 336. Located at [address], [place], [place] [address]  [facility] Mens Shelter- Call [phone]. Located at [address] [place] [address].  [facility]- [employer]. Call [phone]. Office located at [address], [place] [address].  [facility]  through [place] [phone].  [facility]- Call [phone]. Located at [address] [place], [place], [place] [address].  Room at the Inn-For Pregnant mothers. Call [phone]. Located at [address] [place], [address].  [place] [facility]-For men in [place]. Call [phone].  Home of [employer] for [employer] [phone]. Office located at [address] [place], [address].  [employer] be agreeable to help with chores. Call [phone] ext. 5000.  Men's: [address] [place], [place] [address]. Women's: [facility]  [address] [place], [place] [address]   Crisis Services [facility] Mobile Crisis Management- ([phone]  [facility] [address] [place], [place] [address]. Phone: [phone]   *[place] 2-1-1 is another useful way to locate resources in the community. Visit shedsizes.ch to find service information online. If you need additional assistance, 2-1-1 Referral Specialists are available 24 hours a day, every day by dialing 2-1-1 or [phone] from any phone. The call is free, confidential, and available in any language.       Signed: Doyal Miyamoto, MD 05/24/2024, 9:27 AM    "

## 2024-05-22 NOTE — Progress Notes (Signed)
 Spoke to [name] with [employer] who states they are closing out her case. Pt is sleepy but arousable to spoken name, has been up to [facility] and [facility]'s remain stable.

## 2024-05-22 NOTE — Consult Note (Signed)
 [facility] Psychiatric Consult Initial  Patient Name: .[name]  MRN: [id number]  DOB: [birth year]  Consult Order details:  Orders (From admission, onward)     Start     Ordered   05/20/24 1748  IP CONSULT TO PSYCHIATRY       Comments: Pt with suicide attempt of overdose home medicines. Took too much sulfonyurea and so trending CBG q1h x 24 h per poison control recs. Pt is IVC. Transfer to [facility] after? Appreciate eval.  Ordering Provider: Harrie Bruckner, DO  Provider:  (Not yet assigned)  Question Answer Comment  Location [facility]   Reason for Consult? Suicidal attempt      05/20/24 1749             Mode of Visit: In person    Psychiatry Consult Evaluation  Service Date: May 22, 2024 LOS:  LOS: 0 days  Chief Complaint I took multiple pills of my medication with the intention to kill myself because a [place] 4 days ago was bothering me.   Primary Psychiatric Diagnoses  Schizophrenia-history 2.  Bipolar disorder-history 3. GAD 4..  Cocaine use disorder, moderate 5. Cannabis use disorder.   Assessment  [name] is a 34 y.o. female admitted: Medically on 05/20/2024  2:34 PM for intentional overdose on multiple pills. She carries the psychiatric diagnoses of learning disability, Bipolar I, Schizophrenia, MDD/GAD, polysubstance use (marijuana, cocaine), with prior suicide attempts, and medical history of DMII, HLD, HTN. Psychiatry was consulted for suicide attempt.   Patient's current presentation of intentional overdose, overwhelming anxiety,excessive worry, paranoid delusions and impulsivity is most consistent with her diagnosis of schizophrenia, Bipolar disorder and Generalized anxiety. The ongoing symptoms was complicated by multiple substance abuse . She meets criteria for inpatient admission based on recent overdose, ongoing paranoia, impulsivity and inability to contract for safety. However, there is possibility  of symptoms improvement after few days on observation when the cocaine gets metabolized.    Current outpatient psychotropic medications include Invega  sustenna 234 mg every month (need confirmation), Paliperidone  9 mg daily, Ativan  1 mg twice daily, Gabapentin  300 mg three times daily, and Trazodone  150 mg at bedtime, and historically she has had a fair response to these medications. It is difficult to tell if patient was compliant with medications prior to admission as evidenced by frequent hospitalizations.   On follow-up examination, patient was awake, alert and oriented x 3. She appears calm and cooperative, but has psychomotor retardation. Her speech was low volume but clear. Mood was depressed with constricted affect.  Her thinking appears to be fairly concrete, she was able to do some abstraction but had difficulty with theory of mind as it pertains to her own situation. She denied hallucinations, denied SI, HI.  Please see plan below for detailed recommendations.   Diagnoses:  Active Hospital problems: Principal Problem:   Suicide attempt by multiple drug overdose [facility]) Active Problems:   Bipolar I disorder with mixed features (HCC)    Plan   ## Psychiatric Medication Recommendations:  -- Restart outpatient paliperidone  at 3 mg daily (previous dosages 6 or 9?) -- Restart home fluvoxamine  --Increase home Ativan  to 1 mg twice daily as needed for agitation  --Continue medical treatment as the primary team --Consider inpatient admission upon medical stabilization. Note, patient has the possibility of symptom improvement after cocaine has been metabolized. --Will benefit from referral to substance abuse rehab upon hospital discharge (patient is interested) --Psych consult service will  follow up.    ## Medical Decision Making Capacity: Not specifically addressed in this encounter  ## Further Work-up:  --  TSH, B12, folate or EKG -- most recent EKG on 05/20/2024 had QtC of 463 --  Pertinent labwork reviewed earlier this admission includes: urine toxicology is positive for cocaine, THC and BZD   ## Disposition:-- We recommend inpatient psychiatric hospitalization after medical hospitalization. Patient has been involuntarily committed on 05/21/2024.   ## Behavioral / Environmental: -Delirium Precautions: Delirium Interventions for Nursing and Staff: - RN to open blinds every AM. - To Bedside: Glasses, hearing aide, and pt's own shoes. Make available to patients. when possible and encourage use. - Encourage po fluids when appropriate, keep fluids within reach. - OOB to chair with meals. - Passive ROM exercises to all extremities with AM & PM care. - RN to assess orientation to person, time and place QAM and PRN. - Recommend extended visitation hours with familiar family/friends as feasible. - Staff to minimize disturbances at night. Turn off television when pt asleep or when not in use.    ## Safety and Observation Level:  - Based on my clinical evaluation, I estimate the patient to be at HIGH risk of self harm in the current setting. - At this time, we recommend  1:1 Observation. This decision is based on my review of the chart including patient's history and current presentation, interview of the patient, mental status examination, and consideration of suicide risk including evaluating suicidal ideation, plan, intent, suicidal or self-harm behaviors, risk factors, and protective factors. This judgment is based on our ability to directly address suicide risk, implement suicide prevention strategies, and develop a safety plan while the patient is in the clinical setting. Please contact our team if there is a concern that risk level has changed.  CSSR Risk Category:C-SSRS RISK CATEGORY: High Risk  Suicide Risk Assessment: Patient has following modifiable risk factors for suicide: active suicidal ideation, under treated depression , and medication noncompliance, which we are  addressing by prescribing medications. Patient has following non-modifiable or demographic risk factors for suicide: history of suicide attempt and psychiatric hospitalization Patient has the following protective factors against suicide: Cultural, spiritual, or religious beliefs that discourage suicide  Thank you for this consult request. Recommendations have been communicated to the primary team.  We will follow up at this time.   Stacy Morene Lavone Delsie, MD       History of Present Illness  Relevant Aspects of [facility] Course:  Admitted on 05/20/2024 for intentional overdose.    Patient Report:  Patient was evaluated bedside.  She was awake, alert and oriented x 3. Patient denies many aspects of the history provided to the previous psychiatrist, she reports that she took her overdose not as an attempt to kill herself, but is a way to get attention and get into a group home.  She feels that she would benefit from a group home rather than staying alone by herself in an apartment all day. Patient mentions that she is followed up by an ACT team [facility].  Request that they get her into a group home or day program so that she has something to do all day.  Think either one of these is an excellent idea and would substantially reduce the likelihood that she overdoses in the near future.    Review of Systems  Psychiatric/Behavioral:  Positive for substance abuse and suicidal ideas. The patient is nervous/anxious.      Psychiatric and  Social History  Psychiatric History:  Information collected from patient  Prev Dx/Sx: Schizophrenia, Bipolar disorder, Cocaine use disorder, moderate and Cannabis use disorder.  Current Psych Provider: Patient follows up with [facility] team.   Home Meds (current): Invega  sustenna 234 mg every month (need confirmation), Paliperidone  9 mg daily, Gabapentin  300 mg three times daily, and Trazodone  150 mg at  bedtime Previous Med Trials: unsure Therapy: denies  Prior Psych Hospitalization: yes, 2 weeks ago at [facility].  Prior Self Harm:yes, by overdose Prior Violence: denies  Family Psych History: denies Family Hx suicide: denies  Social History:  Educational Hx: unknown Occupational Hx: none, on disability benefit Legal Hx: Yes, two times. First for Misdemeanor and second for Larceny.  Living Situation: Lives alone  Spiritual Hx: unknown  Access to weapons/lethal means: denies access to firearm   Substance History Alcohol: denies   Type of alcohol N/A Last Drink N/A Number of drinks per day N/A History of alcohol withdrawal seizures N/A History of DT's N/A Tobacco: denies Illicit drugs: yes, cocaine and Cannabis use  Prescription drug abuse: denies Rehab hx: Yes, patient is interested in getting discharged to drug rehab to get cleaned up.   Exam Findings  Physical Exam:  Vital Signs:  Temp:  [97.5 F (36.4 C)-98.2 F (36.8 C)] 98.2 F (36.8 C) (01/26 0700) Pulse Rate:  [72-86] 82 (01/26 0454) Resp:  [15-20] 17 (01/26 0700) BP: (122-139)/(73-82) 123/79 (01/26 0700) SpO2:  [94 %-100 %] 100 % (01/26 0454) Weight:  [123.2 kg] 123.2 kg (01/26 0454) Blood pressure 123/79, pulse 82, temperature 98.2 F (36.8 C), temperature source Oral, resp. rate 17, height 5' 7 (1.702 m), weight 123.2 kg, SpO2 100%. Body mass index is 42.54 kg/m.  Physical Exam  Mental Status Exam: General Appearance: Casual  Orientation:  Full (Time, Place, and Person)  Memory:  Immediate;   Fair Recent;   Fair Remote;   Fair  Concentration:  Concentration: Fair and Attention Span: Fair  Recall:  Fair  Attention  Fair  Eye Contact:  Poor  Speech:  Slow  Language:  Good  Volume:  Decreased  Mood: depressed  Affect:  Constricted  Thought Process:  Goal Directed  Thought Content:  WDL  Suicidal Thoughts:  No  Homicidal Thoughts:  No  Judgement:  Poor  Insight:  Lacking  Psychomotor  Activity:  Decreased  Akathisia:  No  Fund of Knowledge:  Fair      Assets:  Communication Skills  Cognition:  WNL  ADL's:  Intact  AIMS (if indicated):        Other History   These have been pulled in through the EMR, reviewed, and updated if appropriate.  Family History:  The patient's family history includes Diabetes in her father; Hypertension in her mother.  Medical History: Past Medical History:  Diagnosis Date   Anxiety    Arthritis    Bipolar 1 disorder (HCC)    Cognitive deficits    Depression    Diabetes mellitus without complication (HCC)    Hypertension    Mental disorder    Mental health disorder    Obesity     Surgical History: Past Surgical History:  Procedure Laterality Date   CESAREAN SECTION     CESAREAN SECTION N/A 04/25/2013   Procedure: REPEAT CESAREAN SECTION;  Surgeon: Winton Felt, MD;  Location: [facility];  Service: Obstetrics;  Laterality: N/A;   MASS EXCISION N/A 06/03/2012   Procedure: EXCISION MASS;  Surgeon: Alm Bouche, MD;  Location: [facility];  Service: ENT;  Laterality: N/A;  Excision uvula mass   TONSILLECTOMY N/A 06/03/2012   Procedure: TONSILLECTOMY;  Surgeon: Alm Bouche, MD;  Location: [facility];  Service: ENT;  Laterality: N/A;   TONSILLECTOMY       Medications:  Current Medications[1]  Allergies: Allergies[2]  Stacy Morene Lavone Delsie, MD       [1]  Current Facility-Administered Medications:    acetaminophen  (TYLENOL ) tablet 650 mg, 650 mg, Oral, Q6H PRN **OR** acetaminophen  (TYLENOL ) suppository 650 mg, 650 mg, Rectal, Q6H PRN, Juberg, Christopher, DO   dextrose  50 % solution 50 mL, 1 ampule, Intravenous, PRN, Nguyen, [name], MD   enoxaparin  (LOVENOX ) injection 40 mg, 40 mg, Subcutaneous, Q24H, Juberg, Christopher, DO, 40 mg at 05/21/24 1811   insulin  aspart (novoLOG ) injection 0-20 Units, 0-20 Units, Subcutaneous, TID WC, Tan, Dawson, MD, 4 Units at 05/22/24 9176    polyethylene glycol (MIRALAX  / GLYCOLAX ) packet 17 g, 17 g, Oral, Daily PRN, Harrie Bruckner, DO [2]  Allergies Allergen Reactions   Wellbutrin  [Bupropion ] Shortness Of Breath, Swelling and Other (See Comments)    Acting up   Buspar  [Buspirone ] Itching   Contrast Media [Iodinated Contrast Media] Swelling and Other (See Comments)    Eyes swell up   Omnipaque  [Iohexol ] Swelling and Other (See Comments)    Eye swelling   Penicillins Hives   Atarax  [Hydroxyzine ] Other (See Comments)    Causes hyperactivity, makes pt want to fight   Cymbalta  [Duloxetine  Hcl] Other (See Comments)    No appetite and makes the patient act up

## 2024-05-22 NOTE — TOC Initial Note (Addendum)
 Transition of Care [facility]) - Initial/Assessment Note    Patient Details  Name: [name] MRN: [id number] Date of Birth: 12/08/1990  Transition of Care [facility]) CM/SW Contact:    Isaiah Public, LCSWA Phone Number: 05/22/2024, 9:21 AM  Clinical Narrative:                    Patient is IVC'd. Psych recommends inpatient psych. for patient.Per MD patient medically cleared. CSW informed by Majel FNP and Danika RN no [facility] beds today. [facility] called ,has no beds available today.CSW will fax patient out for inpatient psych. CSW updated patient.CSW will continue to follow.  Update- Patient request for CSW to fax her out to [facility]. Patient hopeful to get a bed with [facility] tomorrow.     Patient Goals and CMS Choice            Expected Discharge Plan and Services                                              Prior Living Arrangements/Services                       Activities of Daily Living      Permission Sought/Granted                  Emotional Assessment              Admission diagnosis:  Suicide attempt by multiple drug overdose [facility]) [T50.912A] Patient Active Problem List   Diagnosis Date Noted   Suicide attempt by multiple drug overdose (HCC) 05/20/2024   Suicide attempt by benzodiazepine overdose (HCC) 03/14/2024   Altered mental status 02/11/2024   Agitation 09/28/2022   Suicidal ideations 01/04/2022   Suicidal overdose (HCC) 03/23/2021   Syphilis 07/15/2020   Malingering 06/05/2020   Gastroesophageal reflux disease 05/04/2020   Hyperglycemia due to type 2 diabetes mellitus (HCC) 05/04/2020   Long term (current) use of insulin  (HCC) 05/04/2020   Migraine without aura 05/04/2020   Morbid obesity (HCC) 05/04/2020   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 2 diabetes mellitus (HCC) 05/04/2020   Prolapsed internal hemorrhoids 05/04/2020   Vitamin D  deficiency 05/04/2020   Other symptoms and signs involving cognitive  functions and awareness 05/04/2020   Suicide attempt (HCC)    Anxiety state 03/06/2020   Schizophrenia (HCC) 09/13/2019   Bipolar I disorder, most recent episode depressed (HCC) 06/23/2019   MDD (major depressive disorder) 10/10/2018   Schizoaffective disorder, bipolar type (HCC) 09/25/2018   Bipolar I disorder with mixed features (HCC) 06/13/2018   HTN (hypertension) 05/03/2018   Tobacco use disorder 05/03/2018   Adjustment disorder with emotional disturbance 01/02/2018   Schizophrenia, 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   Moderate bipolar I disorder, most recent episode depressed (HCC)    Schizoaffective disorder (HCC)    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/2017   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   OCD (obsessive compulsive disorder) 10/05/2016   Major depressive disorder, recurrent episode, mild 05/04/2016   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   Learning disability 07/18/2015   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/2015   Diabetes mellitus (HCC) 07/18/2015   MDD (major depressive disorder), recurrent, severe, 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   Hyperlipidemia 07/18/2015   Severe episode of recurrent major depressive disorder, without psychotic features (HCC)  Suicidal ideation    OD (overdose of drug), intentional self-harm, initial encounter [facility])    Cognitive deficits 10/12/2012   Generalized anxiety disorder 06/28/2012   PCP:  Rosalea Rosina SAILOR, PA Pharmacy:   [facility] [place], [place] - [address] [place] [address] Phone: [phone] Fax: [phone]     Social Drivers of Health (SDOH) Social History: SDOH Screenings   Food Insecurity: Food Insecurity Present (06/22/2023)  Housing: Low Risk (04/10/2022)  Transportation Needs: No Transportation Needs (06/22/2023)  Utilities: Not At Risk (06/22/2023)  Alcohol Screen: Low Risk (04/10/2022)  Depression (PHQ2-9): High Risk  (07/03/2022)  Tobacco Use: High Risk (05/20/2024)   SDOH Interventions: Food Insecurity Interventions: [facility] Provided, Inpatient TOC   Readmission Risk Interventions     No data to display

## 2024-05-22 NOTE — Progress Notes (Signed)
 "  HD#0 SUBJECTIVE:  Patient Summary: [name] is a 34 y.o. female with a history of DMII, HLD, HTN, learning disability, Bipolar I, Schizophrenia, MDD/GAD, polysubstance use (marijuana, cocaine), with prior suicide attempts, who presented after suicide attempt with multiple medications, who is IVC'd and now medically cleared for [facility] transfer.   Overnight Events:  None  Interim History:  Sitting up on edge of bed smiling at the team and in bright spirits today. She reports that she is feeling less sleepy today and overall feeling well. Denies headache or body pains. Denies nausea or vomiting. No concerns today and is eager to go to [facility]. Again voices that she does not want to go to [facility].   OBJECTIVE:  Vital Signs: Vitals:   05/21/24 1343 05/21/24 2138 05/22/24 0003 05/22/24 0454  BP: 139/82 122/79 131/76 128/73  Pulse:  72 81 82  Resp:  18 18 15   Temp: 97.6 F (36.4 C) 97.9 F (36.6 C) 98.1 F (36.7 C) 98 F (36.7 C)  TempSrc: Oral Oral Oral Oral  SpO2:  99% 96% 100%  Weight:    123.2 kg  Height:       Supplemental O2: Room Air SpO2: 100 %  Filed Weights   05/20/24 1447 05/20/24 2007 05/22/24 0454  Weight: 125 kg 123.4 kg 123.2 kg     Intake/Output Summary (Last 24 hours) at 05/22/2024 0730 Last data filed at 05/21/2024 1719 Gross per 24 hour  Intake 400 ml  Output --  Net 400 ml   Net IO Since Admission: 70 mL [05/22/24 0730]  Physical Exam:  Constitutional: obese and well-appearing female sitting on edge of hospital bed, in no acute distress.  HEENT: normocephalic atraumatic, mucous membranes moist Cardiovascular: mildly tachycardic to 110 and regular rhythm, bilateral radial pulses 2+, bilateral dorsal pedal pulses 2+, brisk capillary refill bilateral feet and hands, no LE edema  Pulmonary/Chest: normal work of breathing on room air, lungs clear to auscultation bilaterally Abdominal: soft, non-tender, non-distended MSK: normal bulk and  tone. Neurological: alert & oriented x 3 Skin: warm and dry Psych: mood calm, behavior normal, thought content normal, judgement normal    Patient Lines/Drains/Airways Status     Active Line/Drains/Airways     Name Placement date Placement time Site Days   Peripheral IV 05/21/24 22 G 1.75 Left;Lateral Forearm 05/21/24  2020  Forearm  1            Pertinent labs and imaging:      Latest Ref Rng & Units 05/20/2024    3:16 PM 05/20/2024    2:54 PM 05/02/2024    4:42 PM  CBC  WBC 4.0 - 10.5 K/uL  7.8  10.7   Hemoglobin 12.0 - 15.0 g/dL 87.3  87.8  87.3   Hematocrit 36.0 - 46.0 % 37.0  37.1  39.3   Platelets 150 - 400 K/uL  220  238        Latest Ref Rng & Units 05/20/2024    3:16 PM 05/20/2024    2:54 PM 05/02/2024    4:42 PM  CMP  Glucose 70 - 99 mg/dL 842  838  880   BUN 6 - 20 mg/dL 11  11  9    Creatinine 0.44 - 1.00 mg/dL 9.29  9.23  9.29   Sodium 135 - 145 mmol/L 142  140  138   Potassium 3.5 - 5.1 mmol/L 3.6  4.0  3.6   Chloride 98 - 111 mmol/L 102  103  101   CO2 22 - 32 mmol/L  29  27   Calcium  8.9 - 10.3 mg/dL  9.3  9.8   Total Protein 6.5 - 8.1 g/dL  7.1  7.5   Total Bilirubin 0.0 - 1.2 mg/dL  0.4  0.4   Alkaline Phos 38 - 126 U/L  50  57   AST 15 - 41 U/L  27  28   ALT 0 - 44 U/L  27  27     No results found.  ASSESSMENT/PLAN:  Assessment: Principal Problem:   Suicide attempt by multiple drug overdose (HCC) Active Problems:   Bipolar I disorder with mixed features (HCC)  [name] is a 34 y.o. female with a history of DMII, HLD, HTN, learning disability, Bipolar I, Schizophrenia, MDD/GAD, polysubstance use (marijuana, cocaine), with prior suicide attempts, who presented after suicide attempt with multiple medications, who is IVC'd and now medically cleared for [facility] transfer.   Plan: #Suicide Attempt #MDD/GAD #IVCd (05/20/2024) #Schizophrenia #Bipolar I Currently IVCd which began 05/20/2024. Monitoring of Q1 blood sugars for 24 hours without  any hypoglycemic events. Psychiatry consulted and plan for [facility] transfer once bed available now that she is medically cleared.  - IVC'd: 05/20/2024 - 1:1 sitter  - Psychiatry consulted, assume care once medically cleared  - s/p Q1 hour CBG checks for 24 hours (no hypoglycemic events noted) - Hold home Fluvoxamine  50 mg nightly - Hold home Ativan  0.5 mg BID prn - Hold home Paliperidone  6 mg daily  - Hold home Trazodone  150 mg nightly  - Hold home SQ Invega  sustenna injection, unsure when last received    #DMII 03/13/24 A1c of 12.1. Home diabetes medication regimen by chart review appears to be Insulin  aspart protamine (70-30) 40 units daily, and Glipizide  10 mg daily. She also takes Gabapentin  300 mg TID. Endorsed taking double of her oral medication dose including glipizide . Given her significant history of suicide attempts, would not recommend sulfonylurea for further management of her diabetes. SSI while admitted.  - s/p Q1 hour CBG checks for 24 hours (no hypoglycemic events noted) - SSI  - Hold home Gabapentin  300 mg TID - Hold home Glipizide  10 mg daily, would consider discontinuing this and alternative for glycemic control  - Hold home Insulin  aspart protamine 70-30, 40 units, plan to resume once transferred to [facility] and/or continue monitoring SSI if able    #HTN History of hypertension controlled with Amlodipine  5 mg daily.  There also appears to be a clonidine  0.1 mg nightly prescription on her historical medications list, though without any prior fill history, and patient previously reported that she was not taking this. She has been normotensive so far. Will hold Amlodipine  for now and resume if persistently elevated BP.  - Hold home Amlodipine  5 mg daily  - Monitor for signs of rebound HTN  Best Practice: Diet: Diabetic diet VTE: enoxaparin  (LOVENOX ) injection 40 mg Start: 05/20/24 1800 Code: Full  Disposition planning: DISPO: Anticipated discharge today or tomorrow to [facility]  pending [facility] bed approval; now medically cleared.  Signature:  Doyal Miyamoto, MD [facility]  7:30 AM, 05/22/2024  On Call pager [phone]  "

## 2024-05-23 DIAGNOSIS — T50912D Poisoning by multiple unspecified drugs, medicaments and biological substances, intentional self-harm, subsequent encounter: Secondary | ICD-10-CM | POA: Diagnosis not present

## 2024-05-23 DIAGNOSIS — F329 Major depressive disorder, single episode, unspecified: Secondary | ICD-10-CM | POA: Diagnosis not present

## 2024-05-23 DIAGNOSIS — I459 Conduction disorder, unspecified: Secondary | ICD-10-CM | POA: Diagnosis not present

## 2024-05-23 DIAGNOSIS — F411 Generalized anxiety disorder: Secondary | ICD-10-CM | POA: Diagnosis not present

## 2024-05-23 LAB — GLUCOSE, CAPILLARY
Glucose-Capillary: 169 mg/dL — ABNORMAL HIGH (ref 70–99)
Glucose-Capillary: 194 mg/dL — ABNORMAL HIGH (ref 70–99)
Glucose-Capillary: 129 mg/dL — ABNORMAL HIGH (ref 70–99)
Glucose-Capillary: 183 mg/dL — ABNORMAL HIGH (ref 70–99)

## 2024-05-23 MED ORDER — AMLODIPINE BESYLATE 5 MG PO TABS
5.0000 mg | ORAL_TABLET | Freq: Every day | ORAL | Status: DC
  Administered 2024-05-23 – 2024-05-24 (×2): 5 mg via ORAL
  Filled 2024-05-23 (×2): qty 1

## 2024-05-23 MED ORDER — DIPHENHYDRAMINE-ZINC ACETATE 2-0.1 % EX CREA
TOPICAL_CREAM | Freq: Every day | CUTANEOUS | Status: DC | PRN
  Filled 2024-05-23: qty 28

## 2024-05-23 NOTE — Plan of Care (Signed)
" °  Problem: Health Behavior/Discharge Planning: Goal: Ability to manage health-related needs will improve Outcome: Progressing   Problem: Education: Goal: Knowledge of General Education information will improve Description: Including pain rating scale, medication(s)/side effects and non-pharmacologic comfort measures Outcome: Progressing   Problem: Clinical Measurements: Goal: Ability to maintain clinical measurements within normal limits will improve Outcome: Progressing   Problem: Clinical Measurements: Goal: Diagnostic test results will improve Outcome: Progressing   Problem: Coping: Goal: Ability to adjust to condition or change in health will improve Outcome: Progressing   Problem: Nutritional: Goal: Maintenance of adequate nutrition will improve Outcome: Progressing   Problem: Nutritional: Goal: Progress toward achieving an optimal weight will improve Outcome: Progressing   "

## 2024-05-23 NOTE — Plan of Care (Signed)

## 2024-05-23 NOTE — TOC Progression Note (Cosign Needed)
 Transition of Care [facility]) - Progression Note    Patient Details  Name: [name] MRN: [id number] Date of Birth: 1990-08-08  Transition of Care [facility]) CM/SW Contact  Clive JAYSON Sharps Phone Number: 05/23/2024, 2:58 PM  Clinical Narrative:     Update-Patient request for SW to update her mother [name] on her DC plan. SW Intern called patient mother [name] and provided update. All questions answered . No further questions reported at this time .                      Expected Discharge Plan and Services                                               Social Drivers of Health (SDOH) Interventions SDOH Screenings   Food Insecurity: Food Insecurity Present (05/23/2024)  Housing: Low Risk (05/23/2024)  Transportation Needs: No Transportation Needs (05/23/2024)  Utilities: Not At Risk (05/23/2024)  Alcohol Screen: Low Risk (04/10/2022)  Depression (PHQ2-9): High Risk (07/03/2022)  Tobacco Use: High Risk (05/20/2024)    Readmission Risk Interventions     No data to display

## 2024-05-23 NOTE — TOC Progression Note (Addendum)
 Transition of Care [facility]) - Progression Note    Patient Details  Name: [name] MRN: [id number] Date of Birth: 1991/01/23  Transition of Care [facility]) CM/SW Contact  Isaiah Public, LCSWA Phone Number: 05/23/2024, 10:51 AM  Clinical Narrative:        CSW refaxed out patient for inpatient psych. CSW will continue to follow.  [facility] called CSW and informed CSW they have no bed availability today.    Update- [employer] called and informed CSW they are reviewing patients referral and will give CSW a call back on if they can offer an inpatient psyh. Bed for patient.  Update- Jamila with [facility] informed CSW that they can offer inpatient psych bed for patient. Accepting MD is Dr. Marnee . Telephone # for report is [phone] , unit sun[address] Sheriff informed CSW that they can pick patient up tomorrow morning to transport to [facility]. CSW informed MD and Jamila with [employer] who confirmed that facility can accept patient tomorrow. CSW will continue to follow and assist with patients dc planning needs.               Expected Discharge Plan and Services                                               Social Drivers of Health (SDOH) Interventions SDOH Screenings   Food Insecurity: Food Insecurity Present (06/22/2023)  Housing: Low Risk (04/10/2022)  Transportation Needs: No Transportation Needs (06/22/2023)  Utilities: Not At Risk (06/22/2023)  Alcohol Screen: Low Risk (04/10/2022)  Depression (PHQ2-9): High Risk (07/03/2022)  Tobacco Use: High Risk (05/20/2024)    Readmission Risk Interventions     No data to display

## 2024-05-23 NOTE — Progress Notes (Addendum)
 "  HD#0 SUBJECTIVE:  Patient Summary: [name] is a 34 y.o. female with a history of DMII, HLD, HTN, learning disability, Bipolar I, Schizophrenia, MDD/GAD, polysubstance use (marijuana, cocaine), with prior suicide attempts, who presented after suicide attempt with multiple medications, who is IVC'd since 1/24, and medically cleared since 1/25, now waiting for inpatient Psych transfer.   Overnight Events:  None  Interim History:  Patient feeling well this morning. She is chipper and smiling, reported she is feeling well this morning. She is eating and drinking well, and has been urinating without issue. She had a BM this morning which was normal in quality per her report.   OBJECTIVE:  Vital Signs: Vitals:   05/22/24 2055 05/22/24 2056 05/23/24 0030 05/23/24 0415  BP: (!) 141/76 (!) 141/76 (!) 139/96 (!) 134/92  Pulse:  85 73 83  Resp:  19 18 20   Temp:  97.8 F (36.6 C) 97.9 F (36.6 C) 98 F (36.7 C)  TempSrc:  Oral Oral Oral  SpO2:  98% 95% 96%  Weight:      Height:       Supplemental O2: Room Air SpO2: 96 %  Filed Weights   05/20/24 1447 05/20/24 2007 05/22/24 0454  Weight: 125 kg 123.4 kg 123.2 kg     Intake/Output Summary (Last 24 hours) at 05/23/2024 9278 Last data filed at 05/23/2024 9285 Gross per 24 hour  Intake 660 ml  Output 1500 ml  Net -840 ml   Net IO Since Admission: -770 mL [05/23/24 0721]  Physical Exam:  Constitutional: obese and well-appearing female sitting up in hospital bed, in no acute distress.  HEENT: normocephalic atraumatic, mucous membranes moist Cardiovascular: regular rate and rhythm, bilateral radial pulses 2+, bilateral dorsal pedal pulses 2+, brisk capillary refill bilateral feet and hands, no LE edema  Pulmonary/Chest: normal work of breathing on room air, lungs clear to auscultation bilaterally Abdominal: soft, non-tender, non-distended MSK: normal bulk and tone. Neurological: alert & oriented x 3 Skin: warm and  dry Psych: mood calm, behavior normal, thought content normal, judgement normal    Patient Lines/Drains/Airways Status     Active Line/Drains/Airways     Name Placement date Placement time Site Days   Peripheral IV 05/21/24 22 G 1.75 Left;Lateral Forearm 05/21/24  2020  Forearm  2            Pertinent labs and imaging:      Latest Ref Rng & Units 05/20/2024    3:16 PM 05/20/2024    2:54 PM 05/02/2024    4:42 PM  CBC  WBC 4.0 - 10.5 K/uL  7.8  10.7   Hemoglobin 12.0 - 15.0 g/dL 87.3  87.8  87.3   Hematocrit 36.0 - 46.0 % 37.0  37.1  39.3   Platelets 150 - 400 K/uL  220  238        Latest Ref Rng & Units 05/20/2024    3:16 PM 05/20/2024    2:54 PM 05/02/2024    4:42 PM  CMP  Glucose 70 - 99 mg/dL 842  838  880   BUN 6 - 20 mg/dL 11  11  9    Creatinine 0.44 - 1.00 mg/dL 9.29  9.23  9.29   Sodium 135 - 145 mmol/L 142  140  138   Potassium 3.5 - 5.1 mmol/L 3.6  4.0  3.6   Chloride 98 - 111 mmol/L 102  103  101   CO2 22 - 32 mmol/L  29  27  Calcium  8.9 - 10.3 mg/dL  9.3  9.8   Total Protein 6.5 - 8.1 g/dL  7.1  7.5   Total Bilirubin 0.0 - 1.2 mg/dL  0.4  0.4   Alkaline Phos 38 - 126 U/L  50  57   AST 15 - 41 U/L  27  28   ALT 0 - 44 U/L  27  27     No results found.  ASSESSMENT/PLAN:  Assessment: Principal Problem:   Suicide attempt by multiple drug overdose (HCC) Active Problems:   Bipolar I disorder with mixed features (HCC)  [name] is a 34 y.o. female with a history of DMII, HLD, HTN, learning disability, Bipolar I, Schizophrenia, MDD/GAD, polysubstance use (marijuana, cocaine), with prior suicide attempts, who presented after suicide attempt with multiple medications, who is IVC'd since 1/24, and medically cleared since 1/25, now waiting for inpatient Psych transfer.   Plan: #Suicide Attempt #MDD/GAD #IVCd (05/20/2024) #Schizophrenia #Bipolar I Currently IVCd which began 05/20/2024. She is now s/p monitoring of Q1 blood sugars for 24 hours without  any hypoglycemic events. Psychiatry consulted and plan for [facility] transfer once bed available now that she is medically cleared.  - IVC'd: 05/20/2024 - 1:1 sitter  - Psychiatry consulted, appreciate assistance, and assume care once medically cleared  - Resume home Fluvoxamine  50 mg nightly - Increase Ativan  1 mg BID prn - Resume home Paliperidone  3 mg daily  - Hold home Trazodone  150 mg nightly  - Hold home SQ Invega  sustenna injection, unsure when last received    #DMII #Morbid Obesity 03/13/24 A1c of 12.1. Home diabetes medication regimen by chart review appears to be Insulin  aspart protamine (70-30) 40 units daily, and Glipizide  10 mg daily. She also takes home Gabapentin  300 mg TID. Given her significant history of suicide attempts, would not recommend sulfonylurea for further management of her diabetes. Would recommend considering GLP-1 if possible that ACT team may be able to administer weekly for both glycemic control and obesity. SSI while admitted.  - s/p Q1 hour CBG checks for 24 hours (no hypoglycemic events noted) - SSI  - Hold home Gabapentin  300 mg TID - Hold home Glipizide  10 mg daily, would consider discontinuing this and alternative for glycemic control  - Hold home Insulin  aspart protamine 70-30, 40 units, plan to resume once transferred to [facility] and/or continue monitoring SSI if able    #HTN History of hypertension controlled with Amlodipine  5 mg daily.  There also appears to be a clonidine  0.1 mg nightly prescription on her historical medications list, though without any prior fill history, and patient previously reported that she was not taking this. Will resume home Amlodipine  given mildly elevated persistent BP.  - Resume home Amlodipine  5 mg daily  - Monitor for signs of rebound HTN  Best Practice: Diet: Diabetic diet VTE: enoxaparin  (LOVENOX ) injection 40 mg Start: 05/20/24 1800 Code: Full  Disposition planning: DISPO: Anticipated discharge today to pending pending  Psych placement. Medically cleared for discharge since 1/25.   Signature:  Doyal Miyamoto, MD [facility]  7:21 AM, 05/23/2024  On Call pager [phone]  "

## 2024-05-23 NOTE — Consult Note (Signed)
 [facility] Psychiatric Consult Initial  Patient Name: .Marticia Reifschneider  MRN: [id number]  DOB: 1990-07-22  Consult Order details:  Orders (From admission, onward)     Start     Ordered   05/20/24 1748  IP CONSULT TO PSYCHIATRY       Comments: Pt with suicide attempt of overdose home medicines. Took too much sulfonyurea and so trending CBG q1h x 24 h per poison control recs. Pt is IVC. Transfer to [facility] after? Appreciate eval.  Ordering Provider: Harrie Bruckner, DO  Provider:  (Not yet assigned)  Question Answer Comment  Location [facility]   Reason for Consult? Suicidal attempt      05/20/24 1749             Mode of Visit: In person    Psychiatry Consult Evaluation  Service Date: May 23, 2024 LOS:  LOS: 0 days  Chief Complaint I took multiple pills of my medication with the intention to kill myself because [place] was bothering me.   Primary Psychiatric Diagnoses  Schizophrenia-history 2.  Bipolar disorder-history 3. GAD 4..  Cocaine use disorder, moderate 5. Cannabis use disorder.   Assessment  Yoali Sahvanna Mcmanigal is a 34 y.o. female admitted: Medically on 05/20/2024  2:34 PM for intentional overdose on multiple pills. She carries the psychiatric diagnoses of learning disability, Bipolar I, Schizophrenia, MDD/GAD, polysubstance use (marijuana, cocaine), with prior suicide attempts, and medical history of DMII, HLD, HTN. Psychiatry was consulted for suicide attempt.   Patient's current presentation of intentional overdose, overwhelming anxiety,excessive worry, paranoid delusions and impulsivity is most consistent with her diagnosis of schizophrenia, Bipolar disorder and Generalized anxiety. The ongoing symptoms was complicated by multiple substance abuse . She meets criteria for inpatient admission based on recent overdose, ongoing paranoia, impulsivity and inability to contract for safety. However, there is possibility  of symptoms improvement after few days on observation when the cocaine gets metabolized.    Current outpatient psychotropic medications include Invega  sustenna 234 mg every month (need confirmation), Paliperidone  9 mg daily, Ativan  1 mg twice daily, Gabapentin  300 mg three times daily, and Trazodone  150 mg at bedtime, and historically she has had a fair response to these medications. It is difficult to tell if patient was compliant with medications prior to admission as evidenced by frequent hospitalizations.   05/22/2024 On follow-up examination, patient was awake, alert and oriented x 3. She appears calm and cooperative, but has psychomotor retardation. Her speech was low volume but clear. Mood was depressed with constricted affect.  Her thinking appears to be fairly concrete, she was able to do some abstraction but had difficulty with theory of mind as it pertains to her own situation. She denied hallucinations, denied SI, HI.  Please see plan below for detailed recommendations.   05/23/2024 Patient seen sitting up in bed this morning on my approach accompanied by sitter at bedside. She reports that she is feeling fine this morning and focused on discharge. It was explained the psychiatric placement is being looking into and that she would be made aware when placement had been obtained. She expressed understanding but insisted that she was not trying to hurt herself and she denied and SI/HI/AVH. The patient reported that she works with an ACT team and  they can assist with her safety.  Two attempts were made to call Garnette [phone] the patient's ACT team lead.  Diagnoses:  Active Hospital problems: Principal Problem:  Suicide attempt by multiple drug overdose [facility]) Active Problems:   Bipolar I disorder with mixed features (HCC)    Plan   ## Psychiatric Medication Recommendations:  -- Restart outpatient paliperidone  at 3 mg daily (previous dosages 6 or 9?) -- Restart home  fluvoxamine  --Increase home Ativan  to 1 mg twice daily as needed for agitation  --Continue medical treatment as the primary team --Consider inpatient admission upon medical stabilization. Note, patient has the possibility of symptom improvement after cocaine has been metabolized. --Will benefit from referral to substance abuse rehab upon hospital discharge (patient is interested) --Psych consult service will follow up.    ## Medical Decision Making Capacity: Not specifically addressed in this encounter  ## Further Work-up:  --  TSH, B12, folate or EKG -- most recent EKG on 05/20/2024 had QtC of 463 -- Pertinent labwork reviewed earlier this admission includes: urine toxicology is positive for cocaine, THC and BZD   ## Disposition:-- We recommend inpatient psychiatric hospitalization after medical hospitalization. Patient has been involuntarily committed on 05/21/2024.  Will continue to attempt to reach out to patient's ACT team. If psychiatric placement can't be obtained will try to work with ACT on safe discharge plan.  ## Behavioral / Environmental: -Delirium Precautions: Delirium Interventions for Nursing and Staff: - RN to open blinds every AM. - To Bedside: Glasses, hearing aide, and pt's own shoes. Make available to patients. when possible and encourage use. - Encourage po fluids when appropriate, keep fluids within reach. - OOB to chair with meals. - Passive ROM exercises to all extremities with AM & PM care. - RN to assess orientation to person, time and place QAM and PRN. - Recommend extended visitation hours with familiar family/friends as feasible. - Staff to minimize disturbances at night. Turn off television when pt asleep or when not in use.    ## Safety and Observation Level:  - Based on my clinical evaluation, I estimate the patient to be at HIGH risk of self harm in the current setting. - At this time, we recommend  1:1 Observation. This decision is based on my review of the  chart including patient's history and current presentation, interview of the patient, mental status examination, and consideration of suicide risk including evaluating suicidal ideation, plan, intent, suicidal or self-harm behaviors, risk factors, and protective factors. This judgment is based on our ability to directly address suicide risk, implement suicide prevention strategies, and develop a safety plan while the patient is in the clinical setting. Please contact our team if there is a concern that risk level has changed.  CSSR Risk Category:C-SSRS RISK CATEGORY: High Risk  Suicide Risk Assessment: Patient has following modifiable risk factors for suicide: active suicidal ideation, under treated depression , and medication noncompliance, which we are addressing by prescribing medications. Patient has following non-modifiable or demographic risk factors for suicide: history of suicide attempt and psychiatric hospitalization Patient has the following protective factors against suicide: Cultural, spiritual, or religious beliefs that discourage suicide  Thank you for this consult request. Recommendations have been communicated to the primary team.  We will follow up at this time.   Porfirio LITTIE Glatter, DO       History of Present Illness  Relevant Aspects of [facility] Course:  Admitted on 05/20/2024 for intentional overdose.    Patient Report:  Patient was evaluated bedside.  She was awake, alert and oriented x 3. Patient denies many aspects of the history provided to the previous psychiatrist, she reports that she took  her overdose not as an attempt to kill herself, but is a way to get attention and get into a group home.  She feels that she would benefit from a group home rather than staying alone by herself in an apartment all day. Patient mentions that she is followed up by an ACT team [facility].  Request that they get her into a group home or day program so that she has  something to do all day.  Think either one of these is an excellent idea and would substantially reduce the likelihood that she overdoses in the near future.    Review of Systems  Psychiatric/Behavioral:  Positive for substance abuse and suicidal ideas. The patient is nervous/anxious.      Psychiatric and Social History  Psychiatric History:  Information collected from patient  Prev Dx/Sx: Schizophrenia, Bipolar disorder, Cocaine use disorder, moderate and Cannabis use disorder.  Current Psych Provider: Patient follows up with [facility] ACT team.   Home Meds (current): Invega  sustenna 234 mg every month (need confirmation), Paliperidone  9 mg daily, Gabapentin  300 mg three times daily, and Trazodone  150 mg at bedtime Previous Med Trials: unsure Therapy: denies  Prior Psych Hospitalization: yes, 2 weeks ago at [facility].  Prior Self Harm:yes, by overdose Prior Violence: denies  Family Psych History: denies Family Hx suicide: denies  Social History:  Educational Hx: unknown Occupational Hx: none, on disability benefit Legal Hx: Yes, two times. First for Misdemeanor and second for Larceny.  Living Situation: Lives alone  Spiritual Hx: unknown  Access to weapons/lethal means: denies access to firearm   Substance History Alcohol: denies   Type of alcohol N/A Last Drink N/A Number of drinks per day N/A History of alcohol withdrawal seizures N/A History of DT's N/A Tobacco: denies Illicit drugs: yes, cocaine and Cannabis use  Prescription drug abuse: denies Rehab hx: Yes, patient is interested in getting discharged to drug rehab to get cleaned up.   Exam Findings  Physical Exam:  Vital Signs:  Temp:  [97.8 F (36.6 C)-98.1 F (36.7 C)] 98 F (36.7 C) (01/27 0838) Pulse Rate:  [73-96] 96 (01/27 0838) Resp:  [18-20] 18 (01/27 0838) BP: (134-149)/(76-97) 143/94 (01/27 0838) SpO2:  [92 %-98 %] 92 % (01/27 0838) Blood pressure (!) 143/94, pulse 96,  temperature 98 F (36.7 C), temperature source Oral, resp. rate 18, height 5' 7 (1.702 m), weight 123.2 kg, SpO2 92%. Body mass index is 42.54 kg/m.  Physical Exam  Mental Status Exam: General Appearance: Casual  Orientation:  Full (Time, Place, and Person)  Memory:  Immediate;   Fair Recent;   Fair Remote;   Fair  Concentration:  Concentration: Fair and Attention Span: Fair  Recall:  Fair  Attention  Fair  Eye Contact:  Poor  Speech:  Slow  Language:  Good  Volume:  Decreased  Mood: Fine  Affect:  Full, reactive   Thought Process:  Goal Directed  Thought Content:  WDL  Suicidal Thoughts:  No  Homicidal Thoughts:  No  Judgement:  Poor  Insight:  Lacking  Psychomotor Activity:  Decreased  Akathisia:  No  Fund of Knowledge:  Fair      Assets:  Communication Skills  Cognition:  WNL  ADL's:  Intact  AIMS (if indicated):        Other History   These have been pulled in through the EMR, reviewed, and updated if appropriate.  Family History:  The patient's family history includes Diabetes in her father; Hypertension  in her mother.  Medical History: Past Medical History:  Diagnosis Date   Anxiety    Arthritis    Bipolar 1 disorder (HCC)    Cognitive deficits    Depression    Diabetes mellitus without complication (HCC)    Hypertension    Mental disorder    Mental health disorder    Obesity     Surgical History: Past Surgical History:  Procedure Laterality Date   CESAREAN SECTION     CESAREAN SECTION N/A 04/25/2013   Procedure: REPEAT CESAREAN SECTION;  Surgeon: Winton Felt, MD;  Location: [facility];  Service: Obstetrics;  Laterality: N/A;   MASS EXCISION N/A 06/03/2012   Procedure: EXCISION MASS;  Surgeon: Alm Bouche, MD;  Location: [facility];  Service: ENT;  Laterality: N/A;  Excision uvula mass   TONSILLECTOMY N/A 06/03/2012   Procedure: TONSILLECTOMY;  Surgeon: Alm Bouche, MD;  Location: [facility];  Service: ENT;   Laterality: N/A;   TONSILLECTOMY       Medications:  Current Medications[1]  Allergies: Allergies[2]  Porfirio LITTIE Glatter, DO        [1]  Current Facility-Administered Medications:    acetaminophen  (TYLENOL ) tablet 650 mg, 650 mg, Oral, Q6H PRN **OR** acetaminophen  (TYLENOL ) suppository 650 mg, 650 mg, Rectal, Q6H PRN, Juberg, Christopher, DO   amLODipine  (NORVASC ) tablet 5 mg, 5 mg, Oral, Daily, Nguyen, [name], MD   dextrose  50 % solution 50 mL, 1 ampule, Intravenous, PRN, Nguyen, [name], MD   enoxaparin  (LOVENOX ) injection 40 mg, 40 mg, Subcutaneous, Q24H, Juberg, Christopher, DO, 40 mg at 05/22/24 1725   fluvoxaMINE  (LUVOX ) tablet 50 mg, 50 mg, Oral, QHS, Wise, Lynwood Morene Deems, MD, 50 mg at 05/22/24 2245   insulin  aspart (novoLOG ) injection 0-20 Units, 0-20 Units, Subcutaneous, TID WC, Tan, Dawson, MD, 4 Units at 05/23/24 9178   LORazepam  (ATIVAN ) tablet 1 mg, 1 mg, Oral, BID PRN, Nguyen, [name], MD, 1 mg at 05/22/24 1503   paliperidone  (INVEGA ) 24 hr tablet 3 mg, 3 mg, Oral, Daily, Delsie Lynwood Morene Deems, MD, 3 mg at 05/23/24 1001   polyethylene glycol (MIRALAX  / GLYCOLAX ) packet 17 g, 17 g, Oral, Daily PRN, Harrie Bruckner, DO [2]  Allergies Allergen Reactions   Wellbutrin  [Bupropion ] Shortness Of Breath, Swelling and Other (See Comments)    Acting up   Buspar  [Buspirone ] Itching   Contrast Media [Iodinated Contrast Media] Swelling and Other (See Comments)    Eyes swell up   Omnipaque  [Iohexol ] Swelling and Other (See Comments)    Eye swelling   Penicillins Hives   Atarax  [Hydroxyzine ] Other (See Comments)    Causes hyperactivity, makes pt want to fight   Cymbalta  [Duloxetine  Hcl] Other (See Comments)    No appetite and makes the patient act up

## 2024-05-24 DIAGNOSIS — E119 Type 2 diabetes mellitus without complications: Secondary | ICD-10-CM | POA: Diagnosis not present

## 2024-05-24 DIAGNOSIS — I10 Essential (primary) hypertension: Secondary | ICD-10-CM | POA: Diagnosis not present

## 2024-05-24 DIAGNOSIS — Z794 Long term (current) use of insulin: Secondary | ICD-10-CM | POA: Diagnosis not present

## 2024-05-24 DIAGNOSIS — F411 Generalized anxiety disorder: Secondary | ICD-10-CM | POA: Diagnosis not present

## 2024-05-24 DIAGNOSIS — F319 Bipolar disorder, unspecified: Secondary | ICD-10-CM | POA: Diagnosis not present

## 2024-05-24 DIAGNOSIS — Z7984 Long term (current) use of oral hypoglycemic drugs: Secondary | ICD-10-CM | POA: Diagnosis not present

## 2024-05-24 DIAGNOSIS — F209 Schizophrenia, unspecified: Secondary | ICD-10-CM | POA: Diagnosis not present

## 2024-05-24 DIAGNOSIS — I459 Conduction disorder, unspecified: Secondary | ICD-10-CM | POA: Diagnosis not present

## 2024-05-24 DIAGNOSIS — T50912D Poisoning by multiple unspecified drugs, medicaments and biological substances, intentional self-harm, subsequent encounter: Secondary | ICD-10-CM | POA: Diagnosis not present

## 2024-05-24 LAB — GLUCOSE, CAPILLARY
Glucose-Capillary: 186 mg/dL — ABNORMAL HIGH (ref 70–99)
Glucose-Capillary: 162 mg/dL — ABNORMAL HIGH (ref 70–99)

## 2024-05-24 NOTE — Plan of Care (Signed)
  Problem: Education: Goal: Knowledge of General Education information will improve Description: Including pain rating scale, medication(s)/side effects and non-pharmacologic comfort measures Outcome: Adequate for Discharge   Problem: Health Behavior/Discharge Planning: Goal: Ability to manage health-related needs will improve Outcome: Adequate for Discharge   Problem: Clinical Measurements: Goal: Ability to maintain clinical measurements within normal limits will improve Outcome: Adequate for Discharge Goal: Will remain free from infection Outcome: Adequate for Discharge Goal: Diagnostic test results will improve Outcome: Adequate for Discharge Goal: Respiratory complications will improve Outcome: Adequate for Discharge Goal: Cardiovascular complication will be avoided Outcome: Adequate for Discharge   Problem: Activity: Goal: Risk for activity intolerance will decrease Outcome: Adequate for Discharge   Problem: Nutrition: Goal: Adequate nutrition will be maintained Outcome: Adequate for Discharge   Problem: Coping: Goal: Level of anxiety will decrease Outcome: Adequate for Discharge   Problem: Elimination: Goal: Will not experience complications related to bowel motility Outcome: Adequate for Discharge Goal: Will not experience complications related to urinary retention Outcome: Adequate for Discharge   Problem: Pain Managment: Goal: General experience of comfort will improve and/or be controlled Outcome: Adequate for Discharge   Problem: Safety: Goal: Ability to remain free from injury will improve Outcome: Adequate for Discharge   Problem: Skin Integrity: Goal: Risk for impaired skin integrity will decrease Outcome: Adequate for Discharge   Problem: Education: Goal: Ability to describe self-care measures that may prevent or decrease complications (Diabetes Survival Skills Education) will improve Outcome: Adequate for Discharge Goal: Individualized Educational  Video(s) Outcome: Adequate for Discharge   Problem: Coping: Goal: Ability to adjust to condition or change in health will improve Outcome: Adequate for Discharge   Problem: Fluid Volume: Goal: Ability to maintain a balanced intake and output will improve Outcome: Adequate for Discharge   Problem: Health Behavior/Discharge Planning: Goal: Ability to identify and utilize available resources and services will improve Outcome: Adequate for Discharge Goal: Ability to manage health-related needs will improve Outcome: Adequate for Discharge   Problem: Metabolic: Goal: Ability to maintain appropriate glucose levels will improve Outcome: Adequate for Discharge   Problem: Nutritional: Goal: Maintenance of adequate nutrition will improve Outcome: Adequate for Discharge Goal: Progress toward achieving an optimal weight will improve Outcome: Adequate for Discharge   Problem: Skin Integrity: Goal: Risk for impaired skin integrity will decrease Outcome: Adequate for Discharge   Problem: Tissue Perfusion: Goal: Adequacy of tissue perfusion will improve Outcome: Adequate for Discharge

## 2024-05-24 NOTE — TOC Transition Note (Addendum)
 Transition of Care [facility]) - Discharge Note   Patient Details  Name: [name] MRN: [id number] Date of Birth: 06-12-1990  [facility]) CM/SW Contact:  Isaiah Public, LCSWA Phone Number: 05/24/2024, 9:05 AM   Clinical Narrative:      Patient will DC to: [facility]   Anticipated DC date: 05/24/2024  Family notified: [name]  Transport by: [place]   ?  Per MD patient ready for DC to [facility] . RN, patient, patient's family, and facility notified of DC.  Accepting MD today is Dr. Virgene Picket. Number for report is [phone], [place], EXT: 4002 Bed# [address]A or [phone]. DC packet on chart.   CSW signing off.    Final next level of care:  (Inpatient psych.) Barriers to Discharge: No Barriers Identified   Patient Goals and CMS Choice            Discharge Placement                Patient to be transferred to facility by: [facility] Name of family member notified: [name] Patient and family notified of of transfer: 05/24/24  Discharge Plan and Services Additional resources added to the After Visit Summary for                                       Social Drivers of Health (SDOH) Interventions SDOH Screenings   Food Insecurity: Food Insecurity Present (05/23/2024)  Housing: Low Risk (05/23/2024)  Transportation Needs: No Transportation Needs (05/23/2024)  Utilities: Not At Risk (05/23/2024)  Alcohol Screen: Low Risk (04/10/2022)  Depression (PHQ2-9): High Risk (07/03/2022)  Tobacco Use: High Risk (05/20/2024)     Readmission Risk Interventions     No data to display

## 2024-05-24 NOTE — TOC Progression Note (Signed)
 Transition of Care [facility]) - Progression Note    Patient Details  Name: [name] MRN: [id number] Date of Birth: 1990/12/06  Transition of Care [facility]) CM/SW Contact  Isaiah Public, LCSWA Phone Number: 05/24/2024, 9:17 AM  Clinical Narrative:     CSW spoke with [facility] and they confirmed they can accept patient today. Accepting MD today is Dr. Virgene Picket. Number for report is [phone], [place], EXT: 4[address] Bed[id number] or [phone]. CSW informed MD.    Barriers to Discharge: No Barriers Identified               Expected Discharge Plan and Services                                               Social Drivers of Health (SDOH) Interventions SDOH Screenings   Food Insecurity: Food Insecurity Present (05/23/2024)  Housing: Low Risk (05/23/2024)  Transportation Needs: No Transportation Needs (05/23/2024)  Utilities: Not At Risk (05/23/2024)  Alcohol Screen: Low Risk (04/10/2022)  Depression (PHQ2-9): High Risk (07/03/2022)  Tobacco Use: High Risk (05/20/2024)    Readmission Risk Interventions     No data to display

## 2024-05-24 NOTE — Plan of Care (Signed)
" °  Problem: Education: Goal: Knowledge of General Education information will improve Description: Including pain rating scale, medication(s)/side effects and non-pharmacologic comfort measures Outcome: Progressing   Problem: Clinical Measurements: Goal: Ability to maintain clinical measurements within normal limits will improve Outcome: Progressing   Problem: Clinical Measurements: Goal: Will remain free from infection Outcome: Progressing   Problem: Clinical Measurements: Goal: Diagnostic test results will improve Outcome: Progressing   Problem: Clinical Measurements: Goal: Respiratory complications will improve Outcome: Progressing   Problem: Clinical Measurements: Goal: Cardiovascular complication will be avoided Outcome: Progressing   Problem: Tissue Perfusion: Goal: Adequacy of tissue perfusion will improve Outcome: Progressing   "

## 2024-06-02 ENCOUNTER — Emergency Department (HOSPITAL_BASED_OUTPATIENT_CLINIC_OR_DEPARTMENT_OTHER)
Admission: EM | Admit: 2024-06-02 | Discharge: 2024-06-02 | Disposition: A | Discharge status: Home / Self Care | Payer: MEDICAID | Source: Home / Self Care | Attending: Emergency Medicine | Admitting: Emergency Medicine

## 2024-06-02 ENCOUNTER — Other Ambulatory Visit: Payer: Self-pay

## 2024-06-02 ENCOUNTER — Encounter (HOSPITAL_BASED_OUTPATIENT_CLINIC_OR_DEPARTMENT_OTHER): Payer: Self-pay

## 2024-06-02 DIAGNOSIS — N3000 Acute cystitis without hematuria: Secondary | ICD-10-CM

## 2024-06-02 DIAGNOSIS — B379 Candidiasis, unspecified: Secondary | ICD-10-CM

## 2024-06-02 LAB — WET PREP, GENITAL
Clue Cells Wet Prep HPF POC: NONE SEEN
Sperm: NONE SEEN
Trich, Wet Prep: NONE SEEN
WBC, Wet Prep HPF POC: >=10 — AB

## 2024-06-02 LAB — URINALYSIS, ROUTINE W REFLEX MICROSCOPIC
Bilirubin Urine: NEGATIVE
Glucose, UA: >1000 mg/dL — AB
Ketones, ur: NEGATIVE mg/dL
Nitrite: NEGATIVE
Protein, ur: 30 mg/dL — AB
RBC / HPF: >50 RBC/hpf (ref 0–5)
Specific Gravity, Urine: >1.046 — ABNORMAL HIGH (ref 1.005–1.030)
pH: 5.5 (ref 5.0–8.0)

## 2024-06-02 MED ORDER — FLUCONAZOLE 150 MG PO TABS: 150.0000 mg | ORAL_TABLET | Freq: Every day | ORAL | 0 refills | Status: AC

## 2024-06-02 MED ORDER — CEPHALEXIN 500 MG PO CAPS: 500.0000 mg | ORAL_CAPSULE | Freq: Three times a day (TID) | ORAL | 0 refills | Status: AC

## 2024-06-02 MED ORDER — CEPHALEXIN 250 MG PO CAPS
500.0000 mg | ORAL_CAPSULE | Freq: Once | ORAL | Status: AC
  Administered 2024-06-02: 500 mg via ORAL
  Filled 2024-06-02: qty 2

## 2024-06-02 MED ORDER — FLUCONAZOLE 150 MG PO TABS
150.0000 mg | ORAL_TABLET | Freq: Once | ORAL | Status: AC
  Administered 2024-06-02: 150 mg via ORAL
  Filled 2024-06-02: qty 1

## 2024-06-02 NOTE — ED Provider Notes (Signed)
 " [place] EMERGENCY DEPARTMENT AT [facility] Provider Note   CSN: [id number] Arrival date & time: 06/02/24  0012     History Chief Complaint  Patient presents with   Vaginal Discharge    HPI [name] is a 34 y.o. female presenting for chief complaint of vaginal itching. HX of BV/Trich/and UTI  Patient's recorded medical, surgical, social, medication list and allergies were reviewed in the Snapshot window as part of the initial history.   Review of Systems   Review of Systems  Constitutional:  Negative for chills and fever.  HENT:  Negative for ear pain and sore throat.   Eyes:  Negative for pain and visual disturbance.  Respiratory:  Negative for cough and shortness of breath.   Cardiovascular:  Negative for chest pain and palpitations.  Gastrointestinal:  Negative for abdominal pain and vomiting.  Genitourinary:  Negative for dysuria and hematuria.  Musculoskeletal:  Negative for arthralgias and back pain.  Skin:  Negative for color change and rash.  Neurological:  Negative for seizures and syncope.  All other systems reviewed and are negative.   Physical Exam Updated Vital Signs BP 120/71   Pulse (!) 109   Temp 98.2 F (36.8 C) (Oral)   Resp 18   LMP 05/19/2024 (Approximate)   SpO2 95%  Physical Exam Vitals and nursing note reviewed.  Constitutional:      General: She is not in acute distress.    Appearance: She is well-developed.  HENT:     Head: Normocephalic and atraumatic.  Eyes:     Conjunctiva/sclera: Conjunctivae normal.  Cardiovascular:     Rate and Rhythm: Normal rate and regular rhythm.     Heart sounds: No murmur heard. Pulmonary:     Effort: Pulmonary effort is normal. No respiratory distress.     Breath sounds: Normal breath sounds.  Abdominal:     General: There is no distension.     Palpations: Abdomen is soft.     Tenderness: There is no abdominal tenderness. There is no right CVA tenderness or left CVA tenderness.   Musculoskeletal:        General: No swelling or tenderness. Normal range of motion.     Cervical back: Neck supple.  Skin:    General: Skin is warm and dry.  Neurological:     General: No focal deficit present.     Mental Status: She is alert and oriented to person, place, and time. Mental status is at baseline.     Cranial Nerves: No cranial nerve deficit.      ED Course/ Medical Decision Making/ A&P    Procedures Procedures   Medications Ordered in ED Medications  fluconazole  (DIFLUCAN ) tablet 150 mg (150 mg Oral Given 06/02/24 0128)  cephALEXin  (KEFLEX ) capsule 500 mg (500 mg Oral Given 06/02/24 0128)    Medical Decision Making:   [name] is a 34 y.o. female who presented to the ED today with chief complaint of vaginal discomfort detailed above.    Additional history discussed with patient's family/caregivers.  Complete initial physical exam performed, notably the patient  was HDS in NAD.    Reviewed and confirmed nursing documentation for past medical history, family history, social history.    Initial Assessment:   Patient complaining of possible STI exposure, urinary tract infection, vaginal yeast infection.  States she was recently at a psychiatric facility and did not bring her insulin  so her blood sugars were elevated while she was there.  She states  that her blood sugar came down today with treatment at home.  She states she always gets yeast infections when her sugars stay high like that and is requesting testing. Vaginal swab completed.  Does show active yeast infection.  Will treat with fluconazole .  Her urine testing also appears to be infected.  She denies known STI contacts and has no pain so cervicitis is considered less likely but patient was swabbed for gonorrhea chlamydia and will follow this up asynchronously per hospital policy.  Will treat the urinary tract infection with Keflex  3 times daily.  She will need repeat dosing of the fluconazole  due to  history of complicated yeast infections.  This was sent to her pharmacy.  Patient educated.  Family member educated.  Patient discharged with no further acute events.   Clinical Impression:  1. Yeast infection   2. Acute cystitis without hematuria      Discharge   Final Clinical Impression(s) / ED Diagnoses Final diagnoses:  Yeast infection  Acute cystitis without hematuria    Rx / DC Orders ED Discharge Orders          Ordered    cephALEXin  (KEFLEX ) 500 MG capsule  3 times daily        06/02/24 0122    fluconazole  (DIFLUCAN ) 150 MG tablet  Daily        06/02/24 0122              Jerral Meth, MD 06/02/24 0148  "

## 2024-06-02 NOTE — ED Triage Notes (Addendum)
 Arrives POV. Reports vaginal burning, discharge, vaginal bumps x3 days. Reports hx of yeast infections. Requesting STD testing.
